# Patient Record
Sex: Female | Born: 1945 | Race: White | Hispanic: No | Marital: Married | Smoking: Former smoker
Health system: Southern US, Community
[De-identification: ages and names within clinical notes are randomized; demographics above are authoritative.]

## PROBLEM LIST (undated history)

## (undated) DIAGNOSIS — I82409 Acute embolism and thrombosis of unspecified deep veins of unspecified lower extremity: Secondary | ICD-10-CM

## (undated) DIAGNOSIS — R079 Chest pain, unspecified: Secondary | ICD-10-CM

## (undated) DIAGNOSIS — F419 Anxiety disorder, unspecified: Secondary | ICD-10-CM

## (undated) DIAGNOSIS — I4891 Unspecified atrial fibrillation: Secondary | ICD-10-CM

## (undated) DIAGNOSIS — I959 Hypotension, unspecified: Secondary | ICD-10-CM

## (undated) DIAGNOSIS — M1990 Unspecified osteoarthritis, unspecified site: Secondary | ICD-10-CM

## (undated) DIAGNOSIS — E119 Type 2 diabetes mellitus without complications: Secondary | ICD-10-CM

## (undated) DIAGNOSIS — C801 Malignant (primary) neoplasm, unspecified: Secondary | ICD-10-CM

## (undated) DIAGNOSIS — F32A Depression, unspecified: Secondary | ICD-10-CM

## (undated) DIAGNOSIS — J45909 Unspecified asthma, uncomplicated: Secondary | ICD-10-CM

## (undated) DIAGNOSIS — R269 Unspecified abnormalities of gait and mobility: Secondary | ICD-10-CM

## (undated) DIAGNOSIS — G8929 Other chronic pain: Secondary | ICD-10-CM

## (undated) DIAGNOSIS — F329 Major depressive disorder, single episode, unspecified: Secondary | ICD-10-CM

## (undated) DIAGNOSIS — G4733 Obstructive sleep apnea (adult) (pediatric): Secondary | ICD-10-CM

## (undated) DIAGNOSIS — R0602 Shortness of breath: Secondary | ICD-10-CM

## (undated) DIAGNOSIS — E039 Hypothyroidism, unspecified: Secondary | ICD-10-CM

## (undated) DIAGNOSIS — E079 Disorder of thyroid, unspecified: Secondary | ICD-10-CM

## (undated) DIAGNOSIS — Z9989 Dependence on other enabling machines and devices: Secondary | ICD-10-CM

## (undated) DIAGNOSIS — K219 Gastro-esophageal reflux disease without esophagitis: Secondary | ICD-10-CM

## (undated) DIAGNOSIS — G43909 Migraine, unspecified, not intractable, without status migrainosus: Secondary | ICD-10-CM

## (undated) DIAGNOSIS — I509 Heart failure, unspecified: Secondary | ICD-10-CM

## (undated) DIAGNOSIS — M2550 Pain in unspecified joint: Secondary | ICD-10-CM

## (undated) DIAGNOSIS — N8502 Endometrial intraepithelial neoplasia [EIN]: Secondary | ICD-10-CM

## (undated) DIAGNOSIS — R609 Edema, unspecified: Secondary | ICD-10-CM

## (undated) DIAGNOSIS — E785 Hyperlipidemia, unspecified: Secondary | ICD-10-CM

## (undated) DIAGNOSIS — Z9289 Personal history of other medical treatment: Secondary | ICD-10-CM

## (undated) DIAGNOSIS — G4730 Sleep apnea, unspecified: Secondary | ICD-10-CM

## (undated) DIAGNOSIS — I10 Essential (primary) hypertension: Secondary | ICD-10-CM

## (undated) DIAGNOSIS — I499 Cardiac arrhythmia, unspecified: Secondary | ICD-10-CM

## (undated) DIAGNOSIS — M549 Dorsalgia, unspecified: Secondary | ICD-10-CM

## (undated) DIAGNOSIS — J189 Pneumonia, unspecified organism: Secondary | ICD-10-CM

## (undated) DIAGNOSIS — G35 Multiple sclerosis: Secondary | ICD-10-CM

## (undated) DIAGNOSIS — K5900 Constipation, unspecified: Secondary | ICD-10-CM

## (undated) DIAGNOSIS — I480 Paroxysmal atrial fibrillation: Secondary | ICD-10-CM

## (undated) HISTORY — DX: Depression, unspecified: F32.A

## (undated) HISTORY — DX: Unspecified abnormalities of gait and mobility: R26.9

## (undated) HISTORY — PX: WISDOM TOOTH EXTRACTION: SHX21

## (undated) HISTORY — DX: Edema, unspecified: R60.9

## (undated) HISTORY — DX: Other chronic pain: G89.29

## (undated) HISTORY — DX: Unspecified osteoarthritis, unspecified site: M19.90

## (undated) HISTORY — DX: Chest pain, unspecified: R07.9

## (undated) HISTORY — PX: OTHER SURGICAL HISTORY: SHX169

## (undated) HISTORY — DX: Endometrial intraepithelial neoplasia (EIN): N85.02

## (undated) HISTORY — PX: BACK SURGERY: SHX140

## (undated) HISTORY — DX: Paroxysmal atrial fibrillation: I48.0

## (undated) HISTORY — DX: Anxiety disorder, unspecified: F41.9

## (undated) HISTORY — PX: VARICOSE VEIN SURGERY: SHX832

## (undated) HISTORY — DX: Personal history of other medical treatment: Z92.89

## (undated) HISTORY — DX: Acute embolism and thrombosis of unspecified deep veins of unspecified lower extremity: I82.409

## (undated) HISTORY — DX: Constipation, unspecified: K59.00

## (undated) HISTORY — DX: Pain in unspecified joint: M25.50

## (undated) HISTORY — PX: CATARACT EXTRACTION, BILATERAL: SHX1313

## (undated) HISTORY — PX: INFUSION PUMP IMPLANTATION: SHX1824

## (undated) HISTORY — DX: Dorsalgia, unspecified: M54.9

## (undated) HISTORY — PX: DILATION AND CURETTAGE OF UTERUS: SHX78

## (undated) HISTORY — PX: TONSILLECTOMY AND ADENOIDECTOMY: SUR1326

## (undated) HISTORY — DX: Major depressive disorder, single episode, unspecified: F32.9

---

## 1981-08-21 DIAGNOSIS — I82409 Acute embolism and thrombosis of unspecified deep veins of unspecified lower extremity: Secondary | ICD-10-CM

## 1981-08-21 HISTORY — DX: Acute embolism and thrombosis of unspecified deep veins of unspecified lower extremity: I82.409

## 1998-02-10 ENCOUNTER — Ambulatory Visit (HOSPITAL_COMMUNITY): Admission: RE | Admit: 1998-02-10 | Discharge: 1998-02-10 | Payer: Self-pay | Admitting: Gynecology

## 1998-04-12 ENCOUNTER — Ambulatory Visit (HOSPITAL_COMMUNITY): Admission: RE | Admit: 1998-04-12 | Discharge: 1998-04-12 | Payer: Self-pay | Admitting: Gynecology

## 1998-04-28 ENCOUNTER — Ambulatory Visit: Admission: RE | Admit: 1998-04-28 | Discharge: 1998-04-28 | Payer: Self-pay | Admitting: Gynecology

## 1998-05-12 ENCOUNTER — Other Ambulatory Visit: Admission: RE | Admit: 1998-05-12 | Discharge: 1998-05-12 | Payer: Self-pay | Admitting: Gynecology

## 1998-05-31 ENCOUNTER — Ambulatory Visit (HOSPITAL_COMMUNITY): Admission: RE | Admit: 1998-05-31 | Discharge: 1998-05-31 | Payer: Self-pay | Admitting: Gynecology

## 1998-05-31 ENCOUNTER — Encounter: Payer: Self-pay | Admitting: Gynecology

## 1998-11-18 ENCOUNTER — Ambulatory Visit (HOSPITAL_COMMUNITY): Admission: RE | Admit: 1998-11-18 | Discharge: 1998-11-18 | Payer: Self-pay | Admitting: Gynecology

## 1998-11-18 ENCOUNTER — Encounter: Payer: Self-pay | Admitting: Gynecology

## 1998-12-15 ENCOUNTER — Encounter: Payer: Self-pay | Admitting: Gynecology

## 1998-12-15 ENCOUNTER — Ambulatory Visit (HOSPITAL_COMMUNITY): Admission: RE | Admit: 1998-12-15 | Discharge: 1998-12-15 | Payer: Self-pay | Admitting: Gynecology

## 1998-12-21 ENCOUNTER — Other Ambulatory Visit: Admission: RE | Admit: 1998-12-21 | Discharge: 1998-12-21 | Payer: Self-pay | Admitting: Gynecology

## 1999-05-09 ENCOUNTER — Ambulatory Visit (HOSPITAL_COMMUNITY): Admission: RE | Admit: 1999-05-09 | Discharge: 1999-05-09 | Payer: Self-pay | Admitting: Gynecology

## 1999-05-09 ENCOUNTER — Encounter: Payer: Self-pay | Admitting: Gynecology

## 1999-09-19 ENCOUNTER — Emergency Department (HOSPITAL_COMMUNITY): Admission: EM | Admit: 1999-09-19 | Discharge: 1999-09-20 | Payer: Self-pay | Admitting: Internal Medicine

## 2000-01-18 ENCOUNTER — Encounter: Payer: Self-pay | Admitting: Gynecology

## 2000-01-18 ENCOUNTER — Encounter: Admission: RE | Admit: 2000-01-18 | Discharge: 2000-01-18 | Payer: Self-pay | Admitting: Gynecology

## 2000-03-01 ENCOUNTER — Ambulatory Visit (HOSPITAL_COMMUNITY): Admission: RE | Admit: 2000-03-01 | Discharge: 2000-03-01 | Payer: Self-pay | Admitting: Gynecology

## 2000-03-01 ENCOUNTER — Encounter: Payer: Self-pay | Admitting: Gynecology

## 2000-08-20 ENCOUNTER — Encounter: Payer: Self-pay | Admitting: Gynecology

## 2000-08-20 ENCOUNTER — Ambulatory Visit (HOSPITAL_COMMUNITY): Admission: RE | Admit: 2000-08-20 | Discharge: 2000-08-20 | Payer: Self-pay | Admitting: Gynecology

## 2001-01-07 ENCOUNTER — Encounter: Payer: Self-pay | Admitting: Gynecology

## 2001-01-07 ENCOUNTER — Ambulatory Visit (HOSPITAL_COMMUNITY): Admission: RE | Admit: 2001-01-07 | Discharge: 2001-01-07 | Payer: Self-pay | Admitting: Gynecology

## 2001-01-20 ENCOUNTER — Encounter: Payer: Self-pay | Admitting: Emergency Medicine

## 2001-01-20 ENCOUNTER — Emergency Department (HOSPITAL_COMMUNITY): Admission: EM | Admit: 2001-01-20 | Discharge: 2001-01-20 | Payer: Self-pay | Admitting: Emergency Medicine

## 2001-01-21 ENCOUNTER — Encounter: Payer: Self-pay | Admitting: Gynecology

## 2001-01-21 ENCOUNTER — Encounter: Admission: RE | Admit: 2001-01-21 | Discharge: 2001-01-21 | Payer: Self-pay | Admitting: Gynecology

## 2001-07-16 ENCOUNTER — Ambulatory Visit (HOSPITAL_COMMUNITY): Admission: RE | Admit: 2001-07-16 | Discharge: 2001-07-16 | Payer: Self-pay | Admitting: Gynecology

## 2001-07-16 ENCOUNTER — Encounter: Payer: Self-pay | Admitting: Gynecology

## 2002-01-28 ENCOUNTER — Encounter: Payer: Self-pay | Admitting: Gynecology

## 2002-01-28 ENCOUNTER — Encounter: Admission: RE | Admit: 2002-01-28 | Discharge: 2002-01-28 | Payer: Self-pay | Admitting: Gynecology

## 2002-01-30 ENCOUNTER — Encounter: Payer: Self-pay | Admitting: Gynecology

## 2002-01-30 ENCOUNTER — Ambulatory Visit (HOSPITAL_COMMUNITY): Admission: RE | Admit: 2002-01-30 | Discharge: 2002-01-30 | Payer: Self-pay | Admitting: Gynecology

## 2002-02-04 ENCOUNTER — Other Ambulatory Visit: Admission: RE | Admit: 2002-02-04 | Discharge: 2002-02-04 | Payer: Self-pay | Admitting: Gynecology

## 2002-09-18 ENCOUNTER — Encounter: Admission: RE | Admit: 2002-09-18 | Discharge: 2002-09-18 | Payer: Self-pay | Admitting: Internal Medicine

## 2002-09-18 ENCOUNTER — Encounter: Payer: Self-pay | Admitting: Internal Medicine

## 2002-10-23 ENCOUNTER — Encounter: Payer: Self-pay | Admitting: Emergency Medicine

## 2002-10-23 ENCOUNTER — Inpatient Hospital Stay (HOSPITAL_COMMUNITY): Admission: EM | Admit: 2002-10-23 | Discharge: 2002-10-29 | Payer: Self-pay | Admitting: Emergency Medicine

## 2002-10-29 ENCOUNTER — Encounter: Payer: Self-pay | Admitting: Neurosurgery

## 2002-11-14 ENCOUNTER — Encounter: Payer: Self-pay | Admitting: Neurosurgery

## 2002-11-14 ENCOUNTER — Encounter: Admission: RE | Admit: 2002-11-14 | Discharge: 2002-11-14 | Payer: Self-pay | Admitting: Neurosurgery

## 2002-12-01 ENCOUNTER — Encounter: Payer: Self-pay | Admitting: Neurosurgery

## 2002-12-01 ENCOUNTER — Encounter: Admission: RE | Admit: 2002-12-01 | Discharge: 2002-12-01 | Payer: Self-pay | Admitting: Neurosurgery

## 2003-01-06 ENCOUNTER — Encounter: Payer: Self-pay | Admitting: Gynecology

## 2003-01-06 ENCOUNTER — Ambulatory Visit (HOSPITAL_COMMUNITY): Admission: RE | Admit: 2003-01-06 | Discharge: 2003-01-06 | Payer: Self-pay | Admitting: Gynecology

## 2003-01-30 ENCOUNTER — Encounter: Payer: Self-pay | Admitting: Gynecology

## 2003-01-30 ENCOUNTER — Encounter: Admission: RE | Admit: 2003-01-30 | Discharge: 2003-01-30 | Payer: Self-pay | Admitting: Gynecology

## 2003-02-26 ENCOUNTER — Encounter: Admission: RE | Admit: 2003-02-26 | Discharge: 2003-02-26 | Payer: Self-pay | Admitting: Neurosurgery

## 2003-02-26 ENCOUNTER — Encounter: Payer: Self-pay | Admitting: Neurosurgery

## 2003-03-04 ENCOUNTER — Other Ambulatory Visit: Admission: RE | Admit: 2003-03-04 | Discharge: 2003-03-04 | Payer: Self-pay | Admitting: Gynecology

## 2003-06-29 ENCOUNTER — Ambulatory Visit (HOSPITAL_COMMUNITY): Admission: RE | Admit: 2003-06-29 | Discharge: 2003-06-29 | Payer: Self-pay | Admitting: Gynecology

## 2003-08-13 ENCOUNTER — Encounter: Admission: RE | Admit: 2003-08-13 | Discharge: 2003-08-13 | Payer: Self-pay | Admitting: Neurosurgery

## 2003-09-24 ENCOUNTER — Ambulatory Visit (HOSPITAL_COMMUNITY): Admission: RE | Admit: 2003-09-24 | Discharge: 2003-09-24 | Payer: Self-pay | Admitting: Gynecology

## 2003-09-26 ENCOUNTER — Inpatient Hospital Stay (HOSPITAL_COMMUNITY): Admission: AD | Admit: 2003-09-26 | Discharge: 2003-09-29 | Payer: Self-pay | Admitting: Emergency Medicine

## 2003-10-14 ENCOUNTER — Ambulatory Visit (HOSPITAL_COMMUNITY): Admission: RE | Admit: 2003-10-14 | Discharge: 2003-10-14 | Payer: Self-pay | Admitting: Neurology

## 2003-10-27 ENCOUNTER — Encounter: Admission: RE | Admit: 2003-10-27 | Discharge: 2003-10-27 | Payer: Self-pay | Admitting: Neurosurgery

## 2003-11-03 ENCOUNTER — Inpatient Hospital Stay (HOSPITAL_COMMUNITY): Admission: EM | Admit: 2003-11-03 | Discharge: 2003-11-06 | Payer: Self-pay | Admitting: Emergency Medicine

## 2003-12-14 ENCOUNTER — Ambulatory Visit (HOSPITAL_COMMUNITY): Admission: RE | Admit: 2003-12-14 | Discharge: 2003-12-14 | Payer: Self-pay | Admitting: Neurology

## 2004-01-07 ENCOUNTER — Encounter: Admission: RE | Admit: 2004-01-07 | Discharge: 2004-01-07 | Payer: Self-pay | Admitting: Neurosurgery

## 2004-01-13 ENCOUNTER — Ambulatory Visit (HOSPITAL_COMMUNITY): Admission: RE | Admit: 2004-01-13 | Discharge: 2004-01-13 | Payer: Self-pay | Admitting: Gynecology

## 2004-01-20 HISTORY — PX: ANTERIOR CERVICAL DECOMP/DISCECTOMY FUSION: SHX1161

## 2004-01-26 ENCOUNTER — Inpatient Hospital Stay (HOSPITAL_COMMUNITY): Admission: RE | Admit: 2004-01-26 | Discharge: 2004-01-31 | Payer: Self-pay | Admitting: Neurosurgery

## 2004-02-03 ENCOUNTER — Inpatient Hospital Stay (HOSPITAL_COMMUNITY): Admission: EM | Admit: 2004-02-03 | Discharge: 2004-02-10 | Payer: Self-pay

## 2004-02-12 ENCOUNTER — Inpatient Hospital Stay (HOSPITAL_COMMUNITY): Admission: AD | Admit: 2004-02-12 | Discharge: 2004-02-15 | Payer: Self-pay | Admitting: Neurological Surgery

## 2004-04-11 ENCOUNTER — Encounter: Admission: RE | Admit: 2004-04-11 | Discharge: 2004-04-11 | Payer: Self-pay | Admitting: Gynecology

## 2004-04-21 ENCOUNTER — Encounter: Admission: RE | Admit: 2004-04-21 | Discharge: 2004-04-21 | Payer: Self-pay | Admitting: Neurosurgery

## 2004-05-03 ENCOUNTER — Other Ambulatory Visit: Admission: RE | Admit: 2004-05-03 | Discharge: 2004-05-03 | Payer: Self-pay | Admitting: Gynecology

## 2004-05-15 ENCOUNTER — Ambulatory Visit (HOSPITAL_COMMUNITY): Admission: RE | Admit: 2004-05-15 | Discharge: 2004-05-15 | Payer: Self-pay | Admitting: Neurosurgery

## 2004-06-15 ENCOUNTER — Encounter: Admission: RE | Admit: 2004-06-15 | Discharge: 2004-06-15 | Payer: Self-pay | Admitting: Internal Medicine

## 2004-08-02 ENCOUNTER — Encounter: Admission: RE | Admit: 2004-08-02 | Discharge: 2004-08-02 | Payer: Self-pay | Admitting: Neurosurgery

## 2004-09-23 ENCOUNTER — Encounter (HOSPITAL_COMMUNITY): Admission: RE | Admit: 2004-09-23 | Discharge: 2004-12-22 | Payer: Self-pay | Admitting: Neurology

## 2004-11-10 ENCOUNTER — Ambulatory Visit (HOSPITAL_COMMUNITY): Admission: RE | Admit: 2004-11-10 | Discharge: 2004-11-10 | Payer: Self-pay | Admitting: Gynecology

## 2004-11-24 ENCOUNTER — Encounter (INDEPENDENT_AMBULATORY_CARE_PROVIDER_SITE_OTHER): Payer: Self-pay | Admitting: *Deleted

## 2004-11-24 ENCOUNTER — Ambulatory Visit (HOSPITAL_COMMUNITY): Admission: RE | Admit: 2004-11-24 | Discharge: 2004-11-24 | Payer: Self-pay | Admitting: Gynecology

## 2004-11-28 ENCOUNTER — Encounter: Admission: RE | Admit: 2004-11-28 | Discharge: 2004-11-28 | Payer: Self-pay | Admitting: Neurosurgery

## 2004-12-29 ENCOUNTER — Encounter (HOSPITAL_COMMUNITY): Admission: RE | Admit: 2004-12-29 | Discharge: 2005-03-29 | Payer: Self-pay | Admitting: Neurology

## 2005-03-30 ENCOUNTER — Encounter (HOSPITAL_COMMUNITY): Admission: RE | Admit: 2005-03-30 | Discharge: 2005-06-28 | Payer: Self-pay | Admitting: Neurology

## 2005-04-12 ENCOUNTER — Encounter: Admission: RE | Admit: 2005-04-12 | Discharge: 2005-04-12 | Payer: Self-pay | Admitting: Gynecology

## 2005-05-12 ENCOUNTER — Ambulatory Visit (HOSPITAL_COMMUNITY): Admission: RE | Admit: 2005-05-12 | Discharge: 2005-05-12 | Payer: Self-pay | Admitting: Gynecology

## 2005-05-18 ENCOUNTER — Other Ambulatory Visit: Admission: RE | Admit: 2005-05-18 | Discharge: 2005-05-18 | Payer: Self-pay | Admitting: Gynecology

## 2005-07-05 ENCOUNTER — Encounter: Admission: RE | Admit: 2005-07-05 | Discharge: 2005-07-05 | Payer: Self-pay | Admitting: Internal Medicine

## 2005-07-07 ENCOUNTER — Encounter: Admission: RE | Admit: 2005-07-07 | Discharge: 2005-07-07 | Payer: Self-pay | Admitting: Neurosurgery

## 2005-07-10 ENCOUNTER — Encounter (HOSPITAL_COMMUNITY): Admission: RE | Admit: 2005-07-10 | Discharge: 2005-10-08 | Payer: Self-pay | Admitting: Neurology

## 2005-07-31 ENCOUNTER — Encounter: Admission: RE | Admit: 2005-07-31 | Discharge: 2005-07-31 | Payer: Self-pay | Admitting: Neurology

## 2005-10-16 ENCOUNTER — Inpatient Hospital Stay (HOSPITAL_COMMUNITY): Admission: AD | Admit: 2005-10-16 | Discharge: 2005-10-19 | Payer: Self-pay | Admitting: Neurology

## 2005-12-15 ENCOUNTER — Encounter: Admission: RE | Admit: 2005-12-15 | Discharge: 2005-12-15 | Payer: Self-pay | Admitting: Internal Medicine

## 2005-12-20 ENCOUNTER — Encounter: Admission: RE | Admit: 2005-12-20 | Discharge: 2005-12-20 | Payer: Self-pay | Admitting: Neurosurgery

## 2006-01-03 ENCOUNTER — Ambulatory Visit (HOSPITAL_COMMUNITY): Admission: RE | Admit: 2006-01-03 | Discharge: 2006-01-03 | Payer: Self-pay | Admitting: Gynecology

## 2006-02-20 ENCOUNTER — Emergency Department (HOSPITAL_COMMUNITY): Admission: EM | Admit: 2006-02-20 | Discharge: 2006-02-21 | Payer: Self-pay | Admitting: Emergency Medicine

## 2006-05-23 ENCOUNTER — Encounter: Admission: RE | Admit: 2006-05-23 | Discharge: 2006-05-23 | Payer: Self-pay | Admitting: Gynecology

## 2006-06-17 ENCOUNTER — Encounter: Admission: RE | Admit: 2006-06-17 | Discharge: 2006-06-17 | Payer: Self-pay | Admitting: Neurology

## 2006-06-20 ENCOUNTER — Encounter (HOSPITAL_COMMUNITY): Admission: RE | Admit: 2006-06-20 | Discharge: 2006-09-18 | Payer: Self-pay | Admitting: Neurology

## 2006-08-02 ENCOUNTER — Encounter: Admission: RE | Admit: 2006-08-02 | Discharge: 2006-08-02 | Payer: Self-pay | Admitting: Neurosurgery

## 2006-09-10 ENCOUNTER — Encounter: Admission: RE | Admit: 2006-09-10 | Discharge: 2006-09-10 | Payer: Self-pay | Admitting: Neurosurgery

## 2006-10-23 ENCOUNTER — Encounter (HOSPITAL_COMMUNITY): Admission: RE | Admit: 2006-10-23 | Discharge: 2007-01-21 | Payer: Self-pay | Admitting: Neurology

## 2007-01-02 ENCOUNTER — Encounter: Admission: RE | Admit: 2007-01-02 | Discharge: 2007-01-02 | Payer: Self-pay | Admitting: Neurosurgery

## 2007-01-25 ENCOUNTER — Encounter (HOSPITAL_COMMUNITY): Admission: RE | Admit: 2007-01-25 | Discharge: 2007-04-25 | Payer: Self-pay | Admitting: Neurology

## 2007-05-08 ENCOUNTER — Encounter: Admission: RE | Admit: 2007-05-08 | Discharge: 2007-05-08 | Payer: Self-pay | Admitting: Neurosurgery

## 2007-05-19 ENCOUNTER — Emergency Department (HOSPITAL_COMMUNITY): Admission: EM | Admit: 2007-05-19 | Discharge: 2007-05-19 | Payer: Self-pay | Admitting: Emergency Medicine

## 2007-06-12 ENCOUNTER — Ambulatory Visit (HOSPITAL_COMMUNITY): Admission: RE | Admit: 2007-06-12 | Discharge: 2007-06-12 | Payer: Self-pay | Admitting: Neurology

## 2007-07-08 ENCOUNTER — Encounter (HOSPITAL_COMMUNITY): Admission: RE | Admit: 2007-07-08 | Discharge: 2007-08-21 | Payer: Self-pay | Admitting: Neurology

## 2007-07-31 ENCOUNTER — Encounter: Admission: RE | Admit: 2007-07-31 | Discharge: 2007-07-31 | Payer: Self-pay | Admitting: Neurosurgery

## 2007-08-02 ENCOUNTER — Encounter: Admission: RE | Admit: 2007-08-02 | Discharge: 2007-08-02 | Payer: Self-pay | Admitting: Neurology

## 2007-08-22 ENCOUNTER — Encounter (HOSPITAL_COMMUNITY): Admission: RE | Admit: 2007-08-22 | Discharge: 2007-11-20 | Payer: Self-pay | Admitting: Neurology

## 2007-09-23 ENCOUNTER — Inpatient Hospital Stay (HOSPITAL_COMMUNITY): Admission: AD | Admit: 2007-09-23 | Discharge: 2007-09-26 | Payer: Self-pay | Admitting: Neurology

## 2007-09-24 ENCOUNTER — Ambulatory Visit: Payer: Self-pay | Admitting: Physical Medicine & Rehabilitation

## 2007-10-17 ENCOUNTER — Inpatient Hospital Stay (HOSPITAL_COMMUNITY): Admission: RE | Admit: 2007-10-17 | Discharge: 2007-10-20 | Payer: Self-pay | Admitting: Neurosurgery

## 2007-12-16 ENCOUNTER — Other Ambulatory Visit: Admission: RE | Admit: 2007-12-16 | Discharge: 2007-12-16 | Payer: Self-pay | Admitting: Gynecology

## 2007-12-16 ENCOUNTER — Encounter: Admission: RE | Admit: 2007-12-16 | Discharge: 2007-12-16 | Payer: Self-pay | Admitting: Gynecology

## 2007-12-17 ENCOUNTER — Ambulatory Visit (HOSPITAL_COMMUNITY): Admission: RE | Admit: 2007-12-17 | Discharge: 2007-12-17 | Payer: Self-pay | Admitting: Gynecology

## 2008-02-19 ENCOUNTER — Encounter (HOSPITAL_COMMUNITY): Admission: RE | Admit: 2008-02-19 | Discharge: 2008-04-15 | Payer: Self-pay | Admitting: Neurology

## 2008-03-16 ENCOUNTER — Encounter: Admission: RE | Admit: 2008-03-16 | Discharge: 2008-03-16 | Payer: Self-pay | Admitting: Neurology

## 2008-06-04 ENCOUNTER — Encounter (HOSPITAL_COMMUNITY): Admission: RE | Admit: 2008-06-04 | Discharge: 2008-09-02 | Payer: Self-pay | Admitting: Neurology

## 2008-09-29 ENCOUNTER — Ambulatory Visit (HOSPITAL_COMMUNITY): Admission: RE | Admit: 2008-09-29 | Discharge: 2008-09-29 | Payer: Self-pay | Admitting: Internal Medicine

## 2008-11-25 ENCOUNTER — Ambulatory Visit (HOSPITAL_COMMUNITY): Admission: RE | Admit: 2008-11-25 | Discharge: 2008-11-25 | Payer: Self-pay | Admitting: Gynecology

## 2009-11-26 ENCOUNTER — Ambulatory Visit (HOSPITAL_COMMUNITY): Admission: RE | Admit: 2009-11-26 | Discharge: 2009-11-26 | Payer: Self-pay | Admitting: Gynecology

## 2009-12-29 ENCOUNTER — Emergency Department (HOSPITAL_COMMUNITY): Admission: EM | Admit: 2009-12-29 | Discharge: 2009-12-29 | Payer: Self-pay | Admitting: Emergency Medicine

## 2010-03-04 ENCOUNTER — Encounter: Admission: RE | Admit: 2010-03-04 | Discharge: 2010-03-04 | Payer: Self-pay | Admitting: Neurology

## 2010-04-21 ENCOUNTER — Ambulatory Visit: Payer: Self-pay | Admitting: Internal Medicine

## 2010-04-21 ENCOUNTER — Observation Stay (HOSPITAL_COMMUNITY): Admission: EM | Admit: 2010-04-21 | Discharge: 2010-04-22 | Payer: Self-pay | Admitting: Emergency Medicine

## 2010-04-22 ENCOUNTER — Encounter (INDEPENDENT_AMBULATORY_CARE_PROVIDER_SITE_OTHER): Payer: Self-pay | Admitting: Emergency Medicine

## 2010-04-22 ENCOUNTER — Ambulatory Visit: Payer: Self-pay | Admitting: Vascular Surgery

## 2010-04-22 DIAGNOSIS — Z9289 Personal history of other medical treatment: Secondary | ICD-10-CM

## 2010-04-22 HISTORY — DX: Personal history of other medical treatment: Z92.89

## 2010-06-18 ENCOUNTER — Inpatient Hospital Stay (HOSPITAL_COMMUNITY): Admission: EM | Admit: 2010-06-18 | Discharge: 2010-06-23 | Payer: Self-pay | Admitting: Emergency Medicine

## 2010-06-21 DIAGNOSIS — Z9289 Personal history of other medical treatment: Secondary | ICD-10-CM

## 2010-06-21 HISTORY — DX: Personal history of other medical treatment: Z92.89

## 2010-06-22 HISTORY — PX: CARDIAC CATHETERIZATION: SHX172

## 2010-09-10 ENCOUNTER — Encounter: Payer: Self-pay | Admitting: Neurosurgery

## 2010-09-10 ENCOUNTER — Encounter: Payer: Self-pay | Admitting: Gynecology

## 2010-09-10 ENCOUNTER — Encounter: Payer: Self-pay | Admitting: Neurology

## 2010-09-11 ENCOUNTER — Encounter: Payer: Self-pay | Admitting: Neurology

## 2010-09-11 ENCOUNTER — Encounter: Payer: Self-pay | Admitting: Gynecology

## 2010-09-11 ENCOUNTER — Encounter: Payer: Self-pay | Admitting: Neurosurgery

## 2010-09-12 ENCOUNTER — Encounter: Payer: Self-pay | Admitting: Gynecology

## 2010-11-01 LAB — COMPREHENSIVE METABOLIC PANEL
ALT: 25 U/L (ref 0–35)
AST: 23 U/L (ref 0–37)
CO2: 33 mEq/L — ABNORMAL HIGH (ref 19–32)
Chloride: 102 mEq/L (ref 96–112)
GFR calc Af Amer: >60 mL/min (ref >60)
GFR calc non Af Amer: 55 mL/min — ABNORMAL LOW (ref >60)
Potassium: 3.9 mEq/L (ref 3.5–5.1)
Sodium: 140 mEq/L (ref 135–145)
Total Bilirubin: 0.8 mg/dL (ref 0.3–1.2)

## 2010-11-01 LAB — BASIC METABOLIC PANEL
CO2: 35 mEq/L — ABNORMAL HIGH (ref 19–32)
Calcium: 8.8 mg/dL (ref 8.4–10.5)
Creatinine, Ser: 1.12 mg/dL (ref 0.4–1.2)
Glucose, Bld: 76 mg/dL (ref 70–99)
Sodium: 143 mEq/L (ref 135–145)

## 2010-11-02 LAB — BASIC METABOLIC PANEL
BUN: 14 mg/dL (ref 6–23)
Calcium: 9.6 mg/dL (ref 8.4–10.5)
Creatinine, Ser: 0.76 mg/dL (ref 0.4–1.2)
GFR calc non Af Amer: >60 mL/min (ref >60)

## 2010-11-02 LAB — CBC
HCT: 40.5 % (ref 36.0–46.0)
Hemoglobin: 15 g/dL (ref 12.0–15.0)
MCH: 31 pg (ref 26.0–34.0)
MCH: 31.4 pg (ref 26.0–34.0)
MCHC: 33.6 g/dL (ref 30.0–36.0)
MCHC: 34.1 g/dL (ref 30.0–36.0)
MCV: 92.3 fL (ref 78.0–100.0)
RDW: 12.3 % (ref 11.5–15.5)

## 2010-11-02 LAB — DIFFERENTIAL
Lymphocytes Relative: 26 % (ref 12–46)
Lymphs Abs: 1.5 10*3/uL (ref 0.7–4.0)
Neutro Abs: 3.3 10*3/uL (ref 1.7–7.7)
Neutrophils Relative %: 60 % (ref 43–77)

## 2010-11-02 LAB — CARDIAC PANEL(CRET KIN+CKTOT+MB+TROPI)
CK, MB: 1.1 ng/mL (ref 0.3–4.0)
Total CK: 46 U/L (ref 7–177)
Troponin I: <0.01 ng/mL (ref 0.00–0.06)
Troponin I: <0.01 ng/mL (ref 0.00–0.06)

## 2010-11-02 LAB — COMPREHENSIVE METABOLIC PANEL
AST: 22 U/L (ref 0–37)
BUN: 16 mg/dL (ref 6–23)
CO2: 32 mEq/L (ref 19–32)
Calcium: 9.4 mg/dL (ref 8.4–10.5)
Creatinine, Ser: 0.73 mg/dL (ref 0.4–1.2)
GFR calc Af Amer: >60 mL/min (ref >60)
GFR calc non Af Amer: >60 mL/min (ref >60)

## 2010-11-02 LAB — SEDIMENTATION RATE: Sed Rate: 9 mm/hr (ref 0–22)

## 2010-11-02 LAB — TSH: TSH: 1.636 u[IU]/mL (ref 0.350–4.500)

## 2010-11-02 LAB — POCT CARDIAC MARKERS
CKMB, poc: <1 ng/mL — ABNORMAL LOW (ref 1.0–8.0)
Myoglobin, poc: 49.1 ng/mL (ref 12–200)
Troponin i, poc: 0.09 ng/mL (ref 0.00–0.09)

## 2010-11-02 LAB — D-DIMER, QUANTITATIVE: D-Dimer, Quant: 0.27 ug/mL-FEU (ref 0.00–0.48)

## 2010-11-02 LAB — CK TOTAL AND CKMB (NOT AT ARMC)
CK, MB: 1.2 ng/mL (ref 0.3–4.0)
Total CK: 39 U/L (ref 7–177)

## 2010-11-03 LAB — POCT CARDIAC MARKERS
CKMB, poc: <1 ng/mL — ABNORMAL LOW (ref 1.0–8.0)
Myoglobin, poc: 76.3 ng/mL (ref 12–200)
Troponin i, poc: <0.05 ng/mL (ref 0.00–0.09)

## 2010-11-03 LAB — URINALYSIS, ROUTINE W REFLEX MICROSCOPIC
Bilirubin Urine: NEGATIVE
Glucose, UA: NEGATIVE mg/dL
Hgb urine dipstick: NEGATIVE
Ketones, ur: NEGATIVE mg/dL
Protein, ur: NEGATIVE mg/dL

## 2010-11-03 LAB — COMPREHENSIVE METABOLIC PANEL
AST: 41 U/L — ABNORMAL HIGH (ref 0–37)
Albumin: 4 g/dL (ref 3.5–5.2)
Alkaline Phosphatase: 61 U/L (ref 39–117)
BUN: 18 mg/dL (ref 6–23)
Chloride: 102 mEq/L (ref 96–112)
GFR calc Af Amer: >60 mL/min (ref >60)
Potassium: 4.3 mEq/L (ref 3.5–5.1)
Total Bilirubin: 0.9 mg/dL (ref 0.3–1.2)

## 2010-11-03 LAB — CBC
MCV: 90.4 fL (ref 78.0–100.0)
Platelets: 210 10*3/uL (ref 150–400)
RBC: 4.91 MIL/uL (ref 3.87–5.11)
WBC: 6.8 10*3/uL (ref 4.0–10.5)

## 2010-11-03 LAB — CK TOTAL AND CKMB (NOT AT ARMC)
Relative Index: INVALID (ref 0.0–2.5)
Relative Index: INVALID (ref 0.0–2.5)
Total CK: 38 U/L (ref 7–177)

## 2010-11-03 LAB — LIPID PANEL
HDL: 43 mg/dL (ref >39)
Total CHOL/HDL Ratio: 3 RATIO

## 2010-11-03 LAB — URINE MICROSCOPIC-ADD ON

## 2010-11-08 LAB — DIFFERENTIAL
Basophils Relative: 1 % (ref 0–1)
Eosinophils Absolute: 0.1 10*3/uL (ref 0.0–0.7)
Eosinophils Relative: 1 % (ref 0–5)
Lymphs Abs: 0.2 10*3/uL — ABNORMAL LOW (ref 0.7–4.0)
Monocytes Absolute: 1 10*3/uL (ref 0.1–1.0)
Monocytes Relative: 13 % — ABNORMAL HIGH (ref 3–12)
Neutrophils Relative %: 82 % — ABNORMAL HIGH (ref 43–77)

## 2010-11-08 LAB — CULTURE, BLOOD (ROUTINE X 2): Culture: NO GROWTH

## 2010-11-08 LAB — COMPREHENSIVE METABOLIC PANEL
ALT: 23 U/L (ref 0–35)
AST: 19 U/L (ref 0–37)
Albumin: 3.5 g/dL (ref 3.5–5.2)
Alkaline Phosphatase: 53 U/L (ref 39–117)
GFR calc Af Amer: >60 mL/min (ref >60)
Glucose, Bld: 75 mg/dL (ref 70–99)
Potassium: 4 mEq/L (ref 3.5–5.1)
Sodium: 138 mEq/L (ref 135–145)
Total Protein: 6.1 g/dL (ref 6.0–8.3)

## 2010-11-08 LAB — URINE CULTURE
Colony Count: NO GROWTH
Culture: NO GROWTH

## 2010-11-08 LAB — URINALYSIS, ROUTINE W REFLEX MICROSCOPIC
Glucose, UA: NEGATIVE mg/dL
Hgb urine dipstick: NEGATIVE
Ketones, ur: NEGATIVE mg/dL
Protein, ur: NEGATIVE mg/dL

## 2010-11-08 LAB — CBC
Hemoglobin: 14 g/dL (ref 12.0–15.0)
RBC: 4.45 MIL/uL (ref 3.87–5.11)
RDW: 12.3 % (ref 11.5–15.5)

## 2010-12-16 HISTORY — PX: VENOUS ABLATION: SHX2656

## 2011-01-03 NOTE — Op Note (Signed)
Toni [name], Toni [name]              ACCOUNT NO.:  [id number]   MEDICAL RECORD NO.:  [id number]          PATIENT TYPE:  INP   LOCATION:  3010                         FACILITY:  [facility]   PHYSICIAN:  Danae Orleans. Venetia Maxon, M.D.  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   PROCEDURE:  Baclofen pump placement, with intrathecal catheter and  subcutaneous reservoir.   SURGEON:  Danae Orleans. Venetia Maxon, M.D.   ASSISTANT:  Georgiann Cocker, R.N.   ANESTHESIA:  General endotracheal anesthesia.   ESTIMATED BLOOD LOSS:  Minimal.   COMPLICATIONS:  None.   DISPOSITION:  To recovery.   INDICATIONS:  Toni [name] is a 65 year old woman with spasticity  secondary to multiple sclerosis.  She did well with a baclofen trial  intrathecally, and it was elected to place a baclofen pump.   PROCEDURE:  Ms. Ansley was brought to the operating room.  She was placed  in the left lateral decubitus position after Foley catheter had been  inserted and general endotracheal anesthesia had been induced.  An  axillary roll was utilized for positioning.  Her back, flank, and  abdomen were then prepped and draped in the usual sterile fashion using  Betadine scrub and paint.  Using C-arm fluoroscopy, the spine was  identified, and a Tuohy needle was inserted into the spinal canal from a  paramedian oblique trajectory at approximately the level of L2-3.  The  intrathecal catheter was then inserted and fed up the level of  approximately T4.  After removing the needle, it was brought back to  about the level of T6.  There was spontaneous flow of CSF through the  catheter.  Cutdown was then performed along the needle, and lumbar  fascia was identified.  An anchoring stitch was placed, along with the  Medtronic anchor, and there was spontaneous flow through the catheter.  Subsequently, a subcutaneous pocket was created in the right abdomen,  and a tunnel was made with a two-piece catheter assembly from the  abdomen to the back.  The pump was filled with 20 mL of 500 mcg/mL  baclofen, and set to run at 50 mcg for 24 hours.  The pump was secured  with the sutureless anchor.  Positioning was confirmed with aspiration  through the sideport.  Three 2-0 silk ties were placed through the  fascia, and the suture loops on the pump, and anchored.  The redundant  catheter was circularized behind the pump, and it was placed in the  subcutaneous pocket.  The Silastic strain relief end was hooked to the  two-piece assembly and anchored appropriately and with a 2-0 silk  stitch.  The wounds were then  irrigated and closed with 2-0 and 3-0 Vicryl subcuticular stitches, and  wounds were dressed with Benzoin, Steri-Strips, Telfa gauze, and tape.  The patient was extubated in the operating room and taken to recovery in  stable satisfactory condition, having tolerated  the operation well.  Counts correct at the end of the case.      Danae Orleans. Venetia Maxon, M.D.  Electronically Signed     JDS/MEDQ  D:  10/17/2007  T:  10/18/2007  Job:  [id number]

## 2011-01-03 NOTE — Discharge Summary (Signed)
NAMESYDNY, SCHNITZLER              ACCOUNT NO.:  [id number]   MEDICAL RECORD NO.:  [id number]          PATIENT TYPE:  INP   LOCATION:  3010                         FACILITY:  [facility]   PHYSICIAN:  Stefani Dama, M.D.  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   DISCHARGE/FINAL DIAGNOSES:  1. 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  2. Status post insertion of baclofen pump.  3. Urinary incontinence.   CONDITION ON DISCHARGE:  Improving.   HOSPITAL COURSE:  Toni [name] is a 65 year old individual who has had  long term problems with spasticity secondary to multiple sclerosis.  She  was recently advised regarding placement of a Baclofen pump and this  procedure was undertaken on October 17, 2007.  Postoperatively, she had  some difficulties with urinary incontinence which had occurred  previously when she had a Baclofen trial.  This was treated  conservatively and at the current time she seems to be recovering quite  well from the surgery.  Her incisions are clean and dry.  She is  ambulatory with minimal assistance.  She will be discharged home today.  She has pain medications at home and no prescriptions are written today.   She will follow up in 7-10 days with Dr. Maeola Harman.   CONDITION ON DISCHARGE:  Improving.      Stefani Dama, M.D.  Electronically Signed     HJE/MEDQ  D:  10/20/2007  T:  10/20/2007  Job:  [id number]

## 2011-01-03 NOTE — H&P (Signed)
Toni [name], RENDA NO.:  [id number]   MEDICAL RECORD NO.:  [id number]          PATIENT TYPE:  INP   LOCATION:  3035                         FACILITY:  [facility]   PHYSICIAN:  Melvyn Novas, M.D.  DATE OF BIRTH:  January 21, 1946   DATE OF ADMISSION:  09/23/2007  DATE OF DISCHARGE:                              HISTORY & PHYSICAL   HISTORY:  She is a 65 year old Caucasian right-handed female.  Toni [name]  is admitted on September 23, 2007, in an elective admission for segmental  myoclonus and spasms in lower back, lower extremity and abdomen that  have become unbearable.   PAST MEDICAL HISTORY:  The patient's past medical history is most  remarkable for multiple sclerosis diagnosed in 1989 with positive  oligoclonal bands by lumbar puncture.  She also has classic MRI  findings.  The patient's symptoms precede a diagnosis by probably 5-7 years.  Toni [name] has been for many years on injectable interferon but then still  had up to 2 relapses in 6 month periods.  We did for a while treat this  patient with monthly Solu-Medrol infusions.  Except for alleviating some  back pain due to radiculopathic degenerative disk changes she did not  benefit in terms of her MS treatment.  She was then changed to Tysabri monoclonal antibodies.  The patient is  now admitted for these abdominal segmental myoclonic spasms.  The  patient is further having a history of neurogenic bladder, gait  disorder, morbid obesity and obstructive sleep apnea and she had a  cervical diskectomy by Dr. Channing Mutters about 4 years ago.  The patient has a  PICC line in place through which she receives Tysabri infusions.   CURRENT MEDICATION:  1. Prevacid 30 mg once a day.  2. Zocor 40 mg once a day.  3. Bystolic 2.5 mg once a day.  4. Wellbutrin extended release form 150 mg in the morning.  5. Lasix 10 mg p.o. in the a.m., can be taken p.r.n. extra for edema      as needed.  6. Flomax 0.4 mg in the morning.  7.  Synthroid 75 mcg daily.  8. Valium 5 mg b.i.d. but also taken as a half pill p.r.n. for spasms.  9. Aspirin 81 mg.  The patient did not take the aspirin for 3 days      prior to this admission.  10.Bethanechol 25 mg 2 tablets b.i.d. p.o. p.r.n. insomnia.  11.The patient has an Ambien order.  12.She also has p.r.n. ordered for laxative.  13.Also for Tylenol in case of fever or severe headaches.  14.The patient takes Neurontin 300 mg t.i.d.  15.Xanax 0.25 mg p.r.n.  16.She took at home Percocet for her lower back pain but had little      relief over the last couple of days.  17.Baclofen 25 mg by mouth up to three times a day and again without      the added Valium did not lead to relief of spasm.   The patient was seen this morning on September 24, 2007, by physical  therapy for a preintrathecal evaluation of  Lioresal 50 mcg as  fluoroscopic lumbar puncture.  This Lioresal intrathecal injection took  place.  The patient returned to the floor and was evaluated at 1 p.m. by  Lonia Skinner, PT.  She has a re-evaluation approximately every 2  hours scheduled up to 6:15 p.m. tonight and has tolerated the procedure  well.  At this time the patient has resumed a very erect posture, a  posture I have not seen with her for about 7 months.  She has been able  to walk with a walker and without a walker but on the walker she could  stand on either leg which I found remarkable.  She also was able to lift  all extremities antigravity without provoking any spasms in hamstrings,  buttocks or abdomen while deep tendon reflexes are preserved.   ASSESSMENT:  The patient's post Lioresal intrathecal infusion assessment  is not yet completed.  We will review tomorrow morning if this patient  is a good candidate for a baclofen pump.  She is an established patient  with Dr. Channing Mutters at [facility] who will refer her to Dr. Venetia Maxon  should they choose to place a baclofen pump.  All other medications  will  be given as ordered.  Discharge is presumed for tomorrow around  lunchtime.  I have not made a decision yet if the patient will receive  her next Tysabri infusion in 16 days' time as scheduled.  At this time  we will see if and how the patient responds to the Lioresal.     PS: The patient had an excellent response documented by physical  therapy, with resolution of spasmodic pain anf improvement in  ambulation.    I have left a message for Dr.Roy with the OR nurse to assure that the  patient will be seen  by him or Dr. Venetia Maxon so an arrangement for  Baclofen Pump insertion can be made.  The patient will be discharged , orders were written and I discussed the  details with her RN.      Melvyn Novas, M.D.  Electronically Signed     CD/MEDQ  D:  09/24/2007  T:  09/25/2007  Job:  [id number]   cc:   Payton Doughty, M.D.

## 2011-01-03 NOTE — Discharge Summary (Signed)
NAMEPATRISIA, [name] NO.:  [id number]   MEDICAL RECORD NO.:  [id number]          PATIENT TYPE:  INP   LOCATION:  3035                         FACILITY:  [facility]   PHYSICIAN:  Melvyn Novas, M.D.  DATE OF BIRTH:  1946/08/01   DATE OF ADMISSION:  09/23/2007  DATE OF DISCHARGE:  09/26/2007                               DISCHARGE SUMMARY   HISTORY OF PRESENT ILLNESS:  The patient was admitted for an  observational stay on September 23, 2007, to the [facility] Third  Floor, Neuroscience, and presented on September 23, 2007, at 5 p.m.  She  was admitted by Dr. Anne Hahn, the neurologist on call.  The patient's admission was meant to evaluate her for possible baclofen  pump and a baclofen challenge with intrathecal baclofen dose to be given  and a fluoroscopic LP was arranged.   The patient was also seen by physical therapy before and after.  The  patient responded very well to the baclofen challenge and was supposed  to be discharged on September 24, 2007, after this challenge.  However,  discharge orders were not followed, and the patient was left until  September 25, 2007, on the floor when she was finally discharged when she  was supposed to be discharged from the neurology service and to have a  followup appointment with Dr. Channing Mutters and Dr. Venetia Maxon at [facility].   Physical therapy and occupational therapy saw no need for further  therapies at home.  The patient was still in the hospital on September 26, 2007, when she was finally physically discharged.   The neurology service had assumed that the patient had already been at  home.   FOLLOWUP:  Followup appointments with neurosurgeons was made by phone  and a date for the implantation of a baclofen pump was set for October 31, 2007.   CURRENT MEDICATIONS:  1. Prevacid 30 mg once a day.  2. Zocor 40 mg once a day.  3. Bystolic 2.5 mg once a day.  4. Wellbutrin extended release 150 mg in the  morning.  5. Lasix.  6. Flomax.  7. Synthroid 75 mcg.  8. Valium 5 mg b.i.d.  9. Aspirin 81 mg a day.  10.Bethanechol 25 mg 2 tablets b.i.d.  11.The patient had Ambien order p.r.n.  12.She has p.r.n. order for laxatives.  13.Tylenol in case of fever or headache.  14.She takes Neurontin 300 mg b.i.d.  15.Xanax p.r.n. 0.25 mg.  16.Baclofen was taken now at 25 mg by mouth 3-4 times a day.  17.Added Valium to relieve myoclonic spasms and segmental myoclonus      which we hope to treat with the intrathecal pump.      Melvyn Novas, M.D.  Electronically Signed     CD/MEDQ  D:  11/06/2007  T:  11/07/2007  Job:  [id number]

## 2011-01-06 NOTE — Discharge Summary (Signed)
NAMEJAMMI, MORRISSETTE NO.:  [id number]   MEDICAL RECORD NO.:  [id number]          PATIENT TYPE:  INP   LOCATION:  3039                         FACILITY:  [facility]   PHYSICIAN:  Melvyn Novas, M.D.  DATE OF BIRTH:  August 01, 1946   DATE OF ADMISSION:  10/16/2005  DATE OF DISCHARGE:                                 DISCHARGE SUMMARY   The patient was born on 12/24/1945.  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    The patient is established with our practice as an MS patient and has also  been followed by Dr. Trey Sailors of [facility] for a  cervical degenerative disk disease and is status post fusion.   The patient received Solu-Medrol 500 mg daily for three days during her  admission and responded well with pain relief.  Percocet was given p.r.n.  The patient also received upon Dr. Temple Pacini request a soft collar to support  her neck spine.  MRI was obtained showing no active MS plugs on her cervical spine, but a  medial disk prolapse/herniation at C7 which is the first vertebral level  below her previous fusion.  Radiographic images are suggestive of bilateral  C7 root impingement.   The patient did tolerate the Solu-Medrol very well.  Also, she developed  some insomnia which was treated with Ambien.  She will now be discharged.  Will still wear the soft collar.  The following medications are ordered.   MEDICATIONS:  1.  Ambien 10 mg p.r.n. insomnia.  2.  A steroid dose pack of prednisone 20 mg for five days q.a.m., then 10 mg      for five days q.a.m., then to be discontinued.  3.  Synthroid 75 mcg daily is a continued medication that the patient was on      before.  4.  Percocet was ordered p.r.n. pain and the patient will have an      unrefillable 20 pill  supply.  5.  Baclofen 20 mg four times daily is a medication that the patient was on      before and will continue.  6.  Continued will further be bethanechol 25 mg two at breakfast, two at      lunch.  7.  Wellbutrin XL 300 mg a day.  8.  Neurontin 300 mg twice a day p.o.  9.  Prevacid 30 mg once a day p.o.  10. Atenolol 50 mg once a day p.o.  11. Hydrochlorothiazide 25 mg p.o.   The patient will follow up with Dr. Felipa Eth within the next four to six weeks  for her primary care needs.  I have discussed with the pharmacist that  during her hospitalization in case the patient should receive  her short-  term steroid treatment , that any insulin coverage is not needed-  as her  fasting glucose levels have been elevated but pharmacy felt that that would  be a  financial burden not warranted for short-term treatment.  The patient will continue her regular IV Solu-Medrol infusions in six weeks.  She will follow up with me in eight weeks for re-visit.  She requested a copy of today's discharge note to go to  Dr. Felipa Eth,  Dr. Trey Sailors,  and would like [facility], her PT establishment, to be informed  of her discharge and would like the cervical radiculopathy addressed by her  current physical therapist who works with her MS disabilities.      Melvyn Novas, M.D.  Electronically Signed     CD/MEDQ  D:  10/19/2005  T:  10/19/2005  Job:  [id number]   cc:   Larina Earthly, M.D.  Fax: [phone]   Payton Doughty, M.D.  Fax: [phone]

## 2011-01-06 NOTE — H&P (Signed)
NAME:  KIANNI, LHEUREUX NO.:  [id number]   MEDICAL RECORD NO.:  [id number]                   PATIENT TYPE:  INP   LOCATION:  3016                                 FACILITY:  [facility]   PHYSICIAN:  Marlan Palau, M.D.               DATE OF BIRTH:  1945-11-24   DATE OF ADMISSION:  11/03/2003  DATE OF DISCHARGE:                                HISTORY & PHYSICAL   HISTORY OF PRESENT ILLNESS:  Britainy Kozub is a 65 year old right-handed  white female born 06-30-1946, with a history of multiple sclerosis  dating back to 1989.  The patient has had very frequent MS attacks recently,  was in hospital about a year ago as well as in February 2005 with MS  exacerbations.  She has just come off of a taper of prednisone within the  last 10 days to 12 days and comes in today with another MS exacerbation. The  patient lay down, took a nap and noted onset after this of some burning  sensations in both legs up to the knees, weakness in the legs, right greater  than left, trouble  burning with the right arm.  The patient has noted that  it is increasingly difficult to ambulate today with a quad cane.  The  patient comes to the emergency room for further evaluation.   PAST MEDICAL HISTORY:  1. History of multiple sclerosis.  2. Obesity.  3. Vein stripping procedures in the legs, varicose veins.  4. Tonsillectomy.  5. Hypertension.  6. Frequent urinary tract infections with neurogenic bladder.  7. Ovarian cyst.  8. Gastroesophageal reflux disease.  9. Hypercholesterolemia.  10.      Elevations in blood sugars with steroid use.   MEDICATIONS:  1. Prevacid 30 mg tablets one daily.  2. Neurontin 300 mg b.i.d.  3. Baclofen 20 mg q.i.d.  4. Atenolol 50 mg daily.  5. Xanax 0.25 mg t.i.d. p.r.n.  6. Flomax 0.4 mg daily.  7. Aspirin 81 mg a day.  8. Bethanechol 25 mg t.i.d.  9. Wellbutrin 300 mg XL tablets one daily.  10.      Potassium 20 mEq daily.  11.       Zocor 20 mg daily.   Again, the patient does not smoke or drink.   ALLERGIES:  ZOLOFT.   SOCIAL HISTORY:  This patient is married, lives in the [place], [place], area.  Is not employed.  Has two children who alive and well.   FAMILY HISTORY:  Mother died in her sleep of unknown cause.  Father died  with an MI.  The patient has one brother who is alive and well.   REVIEW OF SYMPTOMS:  No fever or chills.  The patient denies headache.  Denies any visual field changes.  Denies neck stiffness.  Had some burning  sensations between his shoulder blades.  Denies shortness of breath, chest  pain.  Denies nausea, vomiting, or abdominal pain.  Does have some  occasional urinary incontinence.  Denies any falls recently.  Has not had  any blackout spells or dizziness.   PHYSICAL EXAMINATION:  GENERAL APPEARANCE:  This patient is a markedly obese  white female who is alert, cooperative at the time of examination.  VITAL SIGNS:  Blood pressure 121/71, heart rate 88, respiratory rate 18,  temperature afebrile.  HEENT:  Head is atraumatic. Eyes:  Pupils are equal, round and reactive to  light.  Discs are flat bilaterally.  NECK:  Supple.  No carotid bruits noted.  LUNGS:  Respiratory examination is clear.  CARDIOVASCULAR:  Regular rate and rhythm with no obvious murmurs or rubs  noted.  ABDOMEN:  Obese, positive bowel sounds.  EXTREMITIES:  There was 1 to 2+ edema in the ankles.  NEUROLOGIC:  Cranial nerves as above.  Facial symmetry.  The patient has  good sensation to face, pinprick and soft touch bilaterally.  There is good  strength to facial muscles and muscles to head turning and shoulder shrug  bilaterally.  Speech is well enunciated and not aphasic.  The patient has  full extraocular movements, no evidence of ophthalmoplegia.  Visual fields  are full to double simultaneous stimulation.  Motor testing reveals good  strength in both upper extremities.  The patient has some  weakness with  iliopsoas muscle on the right, more normal on the left.  Distal strength of  the legs is fairly good.  The patient has fair finger-nose-finger, has  difficulty performing heel-to-shin on the right leg, can perform with the  left.  The patient has decreased pin prick sensation on the right leg as  compared to the left and the left arm as compared to the right.  Vibratory  sensation is again, slightly depressed on the right leg as compared to the  left and more symmetric in the arms.  The patient has fair finger-nose-  finger as stated previously.  Deep tendon reflexes are symmetric.  Toes are  neutral bilaterally.  The patient is able to walk short distances with a  quad cane.   Laboratory values are pending at this time.  Chest x-ray and EKGs are  pending.   IMPRESSION:  1. Multiple sclerosis with recent exacerbation.  2. Hypertension.  3. Obesity.   The patient will be admitted at this point for evaluation and treatment of  an multiple sclerosis exacerbation.  Will need to watch blood sugars on the  IV methylprednisolone.   PLAN:  1. Admit to 3000 unit.  2. IV methylprednisolone 250 mg IV piggyback q.12h.  3. Physical and occupational therapy.  4. Diabetic diet.  5. Will follow the patient's clinical course while in house.  The patient     may need, in the future, to consider the possibility of Novantron given     the relative activity and previous exacerbations with her MS.                                                Marlan Palau, M.D.    CKW/MEDQ  D:  11/03/2003  T:  11/05/2003  Job:  [id number]   cc:   Markham Jordan L. Effie Shy, M.D.  [address]  [place] [address]  Fax: [phone]   Larina Earthly, M.D.  [address]  [address]  [place]  [place] [address]  Fax: [phone]   [facility]  [address]  [address]

## 2011-01-06 NOTE — Discharge Summary (Signed)
NAME:  Toni [name], Toni [name]                        ACCOUNT NO.:  [id number]   MEDICAL RECORD NO.:  [id number]                   PATIENT TYPE:  INP   LOCATION:  3019                                 FACILITY:  [facility]   PHYSICIAN:  Payton Doughty, M.D.                   DATE OF BIRTH:  09/03/1945   DATE OF ADMISSION:  02/12/2004  DATE OF DISCHARGE:  02/15/2004                                 DISCHARGE SUMMARY   ADMISSION DIAGNOSES:  1. Cerebrospinal fluid leak.  2. Fever.   DISCHARGE DIAGNOSES:  1. Cerebrospinal fluid leak.  2. Fever.   PROCEDURE:  She had no procedures.   COMPLICATIONS:  None.   This is a 65 year old right handed white female whose history and physical  is recounted in the chart. She has basically undergone anterior  decompression and fusion June 7. Was readmitted on June 15 with CSF leak.  Underwent repair of the leak, and was discharged home on June 22. She was  readmitted by Dr. Danielle Dess this admission on June 23 with fever, and she had  some upper airway congestion. She was admitted to the hospital and after  observation, the neck mass was markedly decreased. She has had no fever. She  has remained on antibiotics. On exam, her strength is full. Currently, she  has no evidence of leakage. There is mild amount of swelling in her neck  which is minimal compared with her prior difficulties. She does not complain  of a headache.   She is being discharge home now on ciprofloxacin. Her followup will be in  the [facility] office in about a week.                                                Payton Doughty, M.D.    MWR/MEDQ  D:  02/15/2004  T:  02/15/2004  Job:  [id number]

## 2011-01-06 NOTE — H&P (Signed)
NAME:  Toni [name], Toni [name]                        ACCOUNT NO.:  [id number]   MEDICAL RECORD NO.:  [id number]                   PATIENT TYPE:  INP   LOCATION:  3019                                 FACILITY:  [facility]   PHYSICIAN:  Stefani Dama, M.D.               DATE OF BIRTH:  01/29/1946   DATE OF ADMISSION:  02/12/2004  DATE OF DISCHARGE:                                HISTORY & PHYSICAL   ADMITTING DIAGNOSES:  Cerebrospinal fluid fistula, fever.   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  The patient was discharged home however, returned on  February 03, 2004 with a cerebrospinal fluid leak from the incision in her neck.  She was taken to the operating room on February 05, 2004 where she underwent  revision.  Postoperatively she was noted to have a modest mass in the  subcutaneous fascia that developed, she was not leaking a cerebrospinal  fluid and was maintained on Cipro 500 mg b.i.d., she was discharged home on  February 10, 2004, she seemed to do well however, on February 11, 2004 she  complained of some airway congestion and she was seen by Dr. Larina [name], her  primary care physician.  He was concerned because she had a fever of 102  degrees.  I asked her to come to the office and I evaluated her there.  Her  white count was 10.2, sed rate was 72; however, there was a prominent mass  in the left anterior area of the incision.  Patient reported that she had  some very modest difficulty with swallowing and she noted no distinct  difficulty with breathing.  I suggested that she should be admitted to the  hospital however, there were no beds in the hospital.  She was carefully  observed overnight at home.  The next morning she notes that she had some  low-grade fever and I advised admission for further observation of this  process.  She is now being admitted for this  purpose.  I discussed her care  with Dr. Delma [name] who will be on call this weekend.  In light of the  difficulties that she has had with cerebrospinal fluid fistula in the past  we feel that it would be best that she be admitted to the hospital for  observation, we can carefully check her temperature, also repeat her blood  work and obtain some plain radiographs of the cervical spine to see if there  is any evidence of either an infection or worsening airway compromise.   PAST MEDICAL HISTORY:  Past medical history reveals that the patient has a  longstanding history of multiple sclerosis and has been on steroids in the  past.   CURRENT MEDICATIONS:  Her current medications include potassium 20 mEq per  day.  She uses Urecholine 25 mg three times a day for her bladder, Prevacid  30 mg a day for reflux, Zocor 20 mg a day for cholesterol, Neurontin 300 mg  twice daily for nerve pain, baclofen 20 mg three to four times a day for  spasms, Dilacor 50/25 one daily for blood pressure, Wellbutrin 300 mg XL for  depression, aspirin 81 mg a day, calcium 600 mg twice daily, Rebif three  times weekly subcutaneous injections for MS and Centrum Silver.  She notes  that the Zoloft causes swelling.   PHYSICAL EXAMINATION:  She is an alert moderately obese individual appearing  in no overt distress.  She sits straight and  upright and has a significant  mass in the left anterior area of her neck just under her incision.  The  mass is fluctuant, soft, and it is compressible; it feels fluid filled;  there is no drainage from the incision itself; the incision itself is not  reddened; a locking suture of nylon is present within this incision; the  mass itself measures 15 cm in breadth, 6 cm in height and is rounded.  The  neck reveals a minimal amount of motion tenderness to palpation posteriorly  to any motion.  Her motor strength however, is good in the deltoids, biceps,  grips, and intrinsics.   Reflexes are 3+ in the biceps, 3+ in the triceps, 3+  in the patella, 2+ in the Achilles, Babinski's are downgoing.  She is  ambulatory with some modest difficulty.  Cranial nerve examination reveals  the pupils are 3 mm briskly reactive to light and accommodation, the  extraocular movements are full, face is symmetric to grimace, tongue and  uvula are in the midline, sclerae and conjunctivae are clear.  The neck  reveals the findings as noted above.  The abdomen is soft, protuberant,  bowel sounds are positive, no masses are palpable.  Extremities reveal no  cyanosis, clubbing, or edema.   IMPRESSION:  Patient has evidence of what I believe to be a cerebrospinal  fluid fistula under the incision.  She has had some fever and there is some  concern about infection.  She is now being admitted to the hospital to  undergo further observation, particularly in concern of potential for  respiratory difficulties.  If fever is noted she will undergo further blood  cultures.  She will be maintained on Cipro 500 mg twice a day for the  current time.                                                Stefani Dama, M.D.    Toni [name]  D:  02/12/2004  T:  02/14/2004  Job:  [id number]

## 2011-01-06 NOTE — Discharge Summary (Signed)
NAME:  Toni [name], Toni [name]                        ACCOUNT NO.:  [id number]   MEDICAL RECORD NO.:  [id number]                   PATIENT TYPE:  INP   LOCATION:  5729                                 FACILITY:  [facility]   PHYSICIAN:  Genene Churn. Love, M.D.                 DATE OF BIRTH:  October 28, 1945   DATE OF ADMISSION:  09/26/2003  DATE OF DISCHARGE:  09/29/2003                                 DISCHARGE SUMMARY   PATIENT'S ADDRESS:  [address]  [place], [place] [address]   REASON FOR ADMISSION:  This is one of several [facility] admissions  for this 65 year old right-handed white married female from [place],  [place], admitted from the emergency room for evaluation of new-onset  weakness, numbness and tightness.   HISTORY OF PRESENT ILLNESS:  Toni [name] developed loss of vision, double  vision, loss of hearing, dysarthria, spasticity, fatigue and burning in her  mouth and right foot and symptoms over the years after initial attack of  multiple sclerosis in 1989 associated with right-sided weakness and  numbness.  Workup at that time by Dr. Candy Sledge included an MRI  study of the brain, lumbar puncture, and visual-evoked responses diagnostic  of multiple sclerosis. She was placed on Avonex for 1-1/2 years about 7  years ago and then changed to Betaseron and in August of 2001, after a  severe attack of multiple sclerosis, was changed to Rebif.  She has had  attacks of multiple sclerosis about every 6 months to 1 year, the last of  which was in March of 2005, for which she was admitted to [facility], and also had a urinary tract infection.  At that time, she  complained of lower back pain and underwent thoracic and lumbar MRI studies  showing no evidence of multiple sclerosis but evidence of degenerative disk  disease and she received lumbar epidural steroid therapies with good  results.  She also received 3 days of IV Solu-Medrol.  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  She has recently felt poorly with lack of  energy and then had developed progressive right and left mid-thoracic  tightening and drawing in her right foot and leg with right leg numbness  extending to her waist without associated Lhermitte's sign or bowel or  bladder incontinence.  She has had a history of recurrent urinary tract  infection, followed by Dr. Lynelle Smoke I. Tannenbaum.  There was no associated  fever or chills with this illness and she came to the emergency room for  further evaluation.   PAST MEDICAL HISTORY:  Her past medical history is significant for:  1. Multiple sclerosis diagnosed in 1989.  2. Urinary tract infections.  3. Left popliteal thrombophlebitis.  4. Uterine fibroids.  5. Hypertension.  6. Obesity.  7.  Depression.  8. Childhood asthma.  9. Sleep apnea.  10.      Fibroids.  11.      Bulging lumbar disk with degenerative disk disease.  12.      Hyperlipidemia.  13.      Obesity.   MEDICATIONS AT TIME OF ADMISSION:  1. Copaxone 20 mg subcu daily.  2. Bethanechol 25 mg p.o. t.i.d.  3. Prevacid 30 mg daily.  4. Atenolol/hydrochlorothiazide 50/25 mg 1 p.o. daily.  5. Zocor 20 mg daily.  6. Neurontin 300 mg b.i.d.  7. Aspirin 1 daily.  8. CPAP 7 cm at night.  9. Lioresal 20 mg p.o. t.i.d.   ALLERGIES:  She has a history of allergy to ZOLOFT.   COMMENT:  Other details have been dictated elsewhere.   PHYSICAL EXAMINATION:  VITAL SIGNS:  Her examination at time of admission  revealed blood pressure in right and left arms of 120/80.  Heart rate was 60  and regular.  There were no bruits.  Her temperature was 99 degrees.  NEUROLOGIC:  Her examination was remarkable for normal mental status, normal  cranial nerves, normal upper extremity examination but significant weakness  in her right leg with a sensory level to approximately L3 and  then a sensory  level from T9 to T4.  She had increased tone in the lower extremities,  increased reflexes in the lower extremities, bilateral upgoing plantar  responses.  GENERAL:  Her general examination was remarkable for obesity.   LABORATORY DATA:  Laboratory data revealed a hemoglobin of 15.2, hematocrit  of 45.3, white blood cell count was 9100, platelet count 283,000; there were  46 polys, 40% lymphs, 9% monocytes, 4% eosinophils, 1% basophils.  Serum  sodium was 140, potassium 3.1, chloride 100, CO2 content was 32, glucose  125, BUN 12, creatinine 0.8, calcium 9.1.  Urinalysis unremarkable.   HOSPITAL COURSE:  The patient was admitted, placed on high-dose IV Solu-  Medrol at 500 mg b.i.d. for 10 doses but during the hospitalization, refused  Solu-Medrol, noted difficulty with anxiety and suspected panic attacks and  Solu-Medrol had to be decreased to 500 mg per day.  The patient noted  significant improvement in her right-sided weakness and chest tightness  while on this medication.  She did receive Neurontin for her chest tightness  and Xanax was given for her anxiety.  She had significant improvement in her  right-sided symptomatology.  At the time of discharge, CBC and basic  metabolic panel are pending.  Her capillary blood glucoses ranged from 120  to 172 and one high of 203 while in the hospital range.   IMPRESSION:  1. 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  2. Multiple sclerosis, code 340.  3. Obesity, code 278.01.  4. Urinary tract infection, code 599.0.  5. Hypertension, code 796.2.  6. Depression, code 301.  7. Childhood asthma.  8. Sleep apnea, code 787.57.  9. History of fibroids.  10.      History of bulging lumbar disk with lower back pain, code 724.4.  11.      Hyperlipidemia, code __________.   PLAN:  Plan at this time is to discharge the patient on: 1. Solu-Medrol 500 mg IV prior to discharge, 500 mg of IV Solu-Medrol, date     September 30, 2003, and the  discontinue saline well as an outpatient.  2. To begin home prednisone 20 mg tablets 3 p.o. daily. x4 days, then 2 p.o.     daily x4 days, then 1  p.o. daily x5 days with added calcium.  3. Other medicines including added potassium will be per her discharge CBC     and comprehensive metabolic panel; these, however, should include:     a. Copaxone 20 mg subcu daily.     b. Bethanechol 25 mg p.o. t.i.d.     c. Baclofen 20 mg p.o. daily.     d. Prevacid 30 mg p.o. daily.     e. Atenolol/hydrochlorothiazide 50/25 mg daily.     f. Neurontin 300 mg b.i.d.     g. Aspirin 1 p.o. daily.     h. Xanax 0.25 mg p.r.n.        a. Last dose of IV Solu-Medrol on September 30, 2003, to be followed by           p.o. prednisone.     i. Calcium 500 mg b.i.d.     j. Kay Ciel p.r.n.     k. Zocor 20 mg p.o. daily.   CONDITION ON DISCHARGE AND FOLLOWUP:  She is discharged improved from pre-  hospital status and will follow up with Dr. Porfirio Mylar Dohmeier in several  weeks.  She will have a basic metabolic panel on October 05, 2003.                                                Genene Churn. Sandria Manly, M.D.    JML/MEDQ  D:  09/29/2003  T:  09/29/2003  Job:  [id number]   cc:   Larina Earthly, M.D.  [address]  [place]  [place] [address]  Fax: [phone]   Sigmund I. Patsi Sears, M.D.  [address]  [place]  [place] [address]  Fax: [phone]   Melvyn Novas, M.D.  [address]  [address]  [place]  [place] [address]  Fax: [phone]

## 2011-01-06 NOTE — Discharge Summary (Signed)
NAME:  Toni [name], Toni [name]                        ACCOUNT NO.:  [id number]   MEDICAL RECORD NO.:  [id number]                   PATIENT TYPE:  INP   LOCATION:  3024                                 FACILITY:  [facility]   PHYSICIAN:  Payton Doughty, M.D.                   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   PROCEDURE:  C3-4, C4-5, and C5-6 intracervical decompression and fusion with  a reflex hyberplate.   COMPLICATIONS:  None.   DISCHARGE STATUS:  Well.   This is  65 year old right-handed white lady whose history and physical is  recounted on the chart. She has MS. She also has cervical spondylosis and  myelopathy. General examination was intact. Medications are Bactrim,  Neurontin, Wellbutrin, Flomax, atenolol, and interferon.   ALLERGIES:  None.   PHYSICAL EXAMINATION:  GENERAL: Intact.  NEUROLOGIC: Awake and oriented. Cranial nerves intact. Motor exam reveals  5/5 strength throughout the upper and lower extremities. She had sensory  deficits below the knee in a nondermatomal pattern. She is areflexic.  Hoffman's is negative.   She had an MRI that showed cervical spondylosis with spinal cord  compression. She was admitted after ascertaining normal laboratory values  and underwent a C3-4, C4-5, and C5-6 anterior cervical decompression and  fusion. Postoperatively, she did pretty well. She was in the ICU for a  couple of days because of MS. She had some swelling in her neck which was  very superficial. Intraoperatively, although there had been a dural injury,  there was no evidence of pseudomeningocele accumulating. She was up and  about, eating and voiding normally. Steroids have been tapered down. She is  being sent home on 10 mg of prednisone a day, Vicodin for  pain, and  ciprofloxacin. Her follow up will be in the [name] office in about a week for suture removal.                                                Payton Doughty, M.D.    MWR/MEDQ  D:  01/31/2004  T:  01/31/2004  Job:  [id number]

## 2011-01-06 NOTE — H&P (Signed)
NAME:  Toni [name], Toni [name] NO.:  [id number]   MEDICAL RECORD NO.:  [id number]                   PATIENT TYPE:  INP   LOCATION:  3003                                 FACILITY:  [facility]   PHYSICIAN:  Melvyn Novas, M.D.               DATE OF BIRTH:  02-08-1946   DATE OF ADMISSION:  10/23/2002  DATE OF DISCHARGE:                                HISTORY & PHYSICAL   HISTORY OF PRESENT ILLNESS:  The patient is a 65 year old Caucasian right  handed female who is seen today on October 23, 2002 for possible urinary tract  infection and pseudo exacerbation of multiple sclerosis in the emergency  room.  She awoke with pain in the early morning hours and noticed a weakness  in her right leg. She tried to go to the bathroom, fell, and has from this  fall some bruises on her right leg and in the hip region and around the  flank.  She had noticed that she had multiple times a night the need to  urinate and that urination did hurt.  She also felt that her bladder was  never completely emptied.   The patient is a former patient of Dr. Fransisca Connors who has MS since  1989.  Fifteen years ago she was diagnosed after an MRI, a visually evoked  potential and an oligoclonal band all had shown signs compatible to MS and  was placed on Avonex, later on Betaseron on which she had two further  relapses, then on Rebif and interferon beta I alpha.  Her last relapse was  in summer 2003 and was left extremity weakness and numbness but no pain was  associated before and today's spell is therefore somewhat different.  She  had four recent UTI's all associated with fever, increased fatigue and pain  with urination and has been seen by Dr. Jethro Bolus.  Today she has  right sided leg radicular pain  but also abdominal pain and flank pain that  could be UTI related.  Her temperature today is 99.5 at home and 101.3  already in the ER.  She is started on Solu-Medrol 125 mg IV X1 and  feels  that this is already giving her some relief.   REVIEW OF SYMPTOMS:  Blurred vision at night, no diplopia, no extraocular  movement abnormalities, no facial weakness, some memory deficits, no  problems with speech and language.  No myalgia, numbness of the right  perioral region, colon vision changes.   PAST MEDICAL HISTORY:  MS 15 years ago diagnosed.  Frequent bladder  infections.  Ovarian cysts, uterine fibroids.  __________  thrombophlebitis  with negative Doppler studies, left lower extremity only.  Further  hypertension, depression, childhood asthma, now resolved, vein stripping in  1968 at the age of 52, T&A 4.   MEDICATIONS:  At home:  1. Baclofen 20 mg p.o. t.i.d.  2. Neurontin 300 mg  p.o. t.i.d.  3. Wellbutrin 300 mg p.o. daily.  4. Flomax 0.4 mg p.o. daily.  5. Prevacid 30 mg p.o. daily.  6. Atenolol/hydrochlorothiazide 50/25 p.o. daily.  7. Interferon beta I alpha Rebif q. other day, subcu injections Mondays,     Wednesdays and Fridays.   ALLERGIES:  No known drug allergies.   FAMILY HISTORY:  The patient is one of two children of parents.  She has a  healthy brother who suffers from no neurological or other health problems.  Her father died when the patient was already in her 65's, of an MI at the  age of 15 but mother died at age 49 of a cerebrovascular accident.  She had  probably underlying heart disease which was not diagnosed but the patient  remembers that she had CHF-like symptoms with shortness of breath and  swelling of the ankles.   SOCIAL HISTORY:  The patient quit tobacco in 1970.  Very occasional alcohol.  She is married, college educated, has two healthy children.   PHYSICAL EXAMINATION:  GENERAL:  This is a very obese but pleasant patient,  well-kept and well informed about her disease.  She is able to give  chronologically, medically detailed information of her past medical history.  Has an ease of conversation as well as she is able to  ask differentiated  questions regarding her further treatment.  HEENT:  Normocephalic, atraumatic, pupils react equally to light.  No  exudate.  Mucous membranes well perfused.  NECK:  No bruits.  CARDIOVASCULAR:  Regular rate and rhythm, normal S1 and S2, no murmurs.  PULMONARY:  At bases very little air movement.  The patient might have a  barrel chest.  GASTROINTESTINAL:  Positive bowel sounds.  Lower abdominal pain with  palpation, distended bladder.  EXTREMITIES:  Distention of the lower extremities is also noted with  significant edema and varicoses, decreased peripheral pulses noted.  SKIN:  No rash.  The only lesions are bruises from her recent fall, no hyper  or hypopigmentation is noted.  There is an old scar from vein stripping on  the right lower leg.  NEUROLOGICAL:  The patient is alert and oriented X3, very pleasant. She  shows no sign of __________.  No neglect.  Cranial nerves:  Pupils  are  equal, round, reactive to light and accommodation.  Extraocular movements  appear intact.  The patient does have normal fundi in spite of pallor around  the macular as well as the optic nerve papillae.  She does have no  optokinetic reflexes and can follow light and shape very well.  She seems to  have slightly more visual acuity on the right than on the left.  There is no  nystagmus, her gaze is conjugate.  She has equal sensation and motor  strength bilaterally.  She can move tongue and uvula midline and has an easy  elicited gag. Her neck is supple.  Motor examination shows equal strength,  tone and mass of the upper extremities; in the lower extremities there is a  right sided weakness that seems to be weakness from pain especially  exacerbated with hip flexion, retroflexion and knee extension. Dorsiflexion  and plantar flexion of the right foot are slightly weaker than on the left. The patient does have deep tendon reflexes that are trace in the lower  extremities.  Sensory  examination shows vibration intact up to the thigh  level.  Pinprick is intact throughout.  Light touch and position sense seem  to be intact.  Reflexes are symmetric except for the traces only in right  knee and Achilles' tendon.  No clonus.   Gait examination wide based, short steps, somewhat unsteady.  Uses a tripod  cane.  Needs no assistance, is unable to turn or sit down in her bed without  some assistance.  She is evidently not able to bear the same amount of  weight on the right leg due to her lower leg pain and extremity pain.   LABORATORY DATA:  So far the urinalysis is positive for UTI.   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  Bactrim was ordered as the patient has no sulfa allergy.  For the pain we ordered a one time Solu-Medrol and see if this will help  her.  She is also very fatigued and we might put her on Provigil.  Spasticity is increased, she needs Baclofen.  We will monitor the input and  also order a rehabilitation and physical therapy consultation.  It is  recommended that when she leaves the  hospital she will follow up with Dr. Patsi Sears who manages her bladder  infections.  The lower extremity swelling is not unusual for her.  We will  first focus on an MRI of the thoracolumbar spine area should she have more  radicular pain than urinary related flank pain.                                               Melvyn Novas, M.D.    CD/MEDQ  D:  10/25/2002  T:  10/26/2002  Job:  [id number]   cc:   Lynelle Smoke I. Patsi Sears, M.D.  [address]  [place]  [place] [address]  Fax: [phone]

## 2011-01-06 NOTE — H&P (Signed)
NAMELAYLANA, GERWIG              ACCOUNT NO.:  [id number]   MEDICAL RECORD NO.:  [id number]          PATIENT TYPE:  INP   LOCATION:  3039                         FACILITY:  [facility]   PHYSICIAN:  Bevelyn Buckles. Champey, M.D.DATE OF BIRTH:  16-Jul-1946   DATE OF ADMISSION:  10/16/2005  DATE OF DISCHARGE:                                HISTORY & PHYSICAL   HISTORY:  This is a 65 year old Caucasian female with multiple medical  problems including multiple sclerosis who presents for admission with  multiple sclerosis exacerbation/pain syndrome. The patient states for the  last few weeks she has developed worsening back spasms and upper back  spasms. Her spasms started in the left upper back, torso, and left shoulder  a few weeks ago. She was given prednisone taper without any benefit and then  started on hydrocodone and Flexeril without any relief. The pain then  shifted to the right shoulder, upper back, and torso. The patient has been  going to PT and massage therapy with some benefit. Today, the patient  developed worsening spasms on the right side. She also feels like her right  leg might be numb. The patient has had occasional lightheadedness as well.  She denies any new weakness, visual changes, speech changes, vertigo,  swallowing problems, falls, or loss of consciousness.   PAST MEDICAL HISTORY:  Positive for:  1.  MS.  2.  Hypertension.  3.  High cholesterol.  4.  Thyroid problems.   CURRENT MEDICATIONS:  Include atenolol, Rebif, Neurontin, baclofen,  Synthroid.   ALLERGIES:  The patient has drug allergies to ZOLOFT which causes hives.   FAMILY HISTORY:  Positive for heart disease.   SOCIAL HISTORY:  The patient lives with her husband. Denies any tobacco or  alcohol use.   REVIEW OF SYSTEMS:  Positive as per HPI and also positive for low-grade  fever and depression. Review of systems is negative as per HPI in greater  than seven other organ systems.   EXAMINATION:  VITAL  SIGNS:  Temperature is 98.2, blood pressure is 138/74,  pulse is 79, respirations 20, O2 saturation is 97%.  HEENT:  Normocephalic, atraumatic. Extraocular muscles are intact. Pupils  are equal.  NECK:  Supple, no carotid bruits.  HEART:  Regular.  LUNGS:  Clear.  ABDOMEN:  Soft, nontender.  EXTREMITIES:  Show 1+ edema in the lower extremities and good pulses.  NEUROLOGIC:  The patient is awake, alert, and oriented x3. Language, distant  memory, knowledge are within normal limits. Cranial nerves II-XII are  grossly intact. Motor examination shows 4+/5 to 5/5 strength throughout  except for right proximal lower extremity where she has 4/5 strength. The  patient has normal tone in all four extremities; no drift is noted. Sensory  examination:  The patient has decreased sensation to light touch in the  right extremities, greater in the lower extremity than the upper extremity.  Reflexes are trace to 1+ throughout. Toes are neutral bilaterally.  Cerebellar function is within normal limits finger-to-nose. Gait is slightly  wide based, is steady with assistance/support.   LABORATORY DATA:  WBC is 7.1, hemoglobin  is 13.7, hematocrit is 40.2,  platelets 264. Sodium is 134, potassium is 3.5, chloride is 98, CO2 is 29,  BUN 15, creatinine 0.7, glucose 86, calcium was 9.1.   IMPRESSION:  This is a 65 year old with possible multiple sclerosis  exacerbation versus pain syndrome. This case was discussed with Dr.  Vickey Huger, who is directly admitting the patient and she has already written  orders for it. We will admit the patient. We will give her IV steroids which  were ordered by Dr. Vickey Huger, and Percocet p.r.n. We will check MRI of the C  spine, urinalysis to rule out infection. We will continue her home  medication and regimen. We will get PT/OT consults as well and place the  patient on DVT prophylaxis.      Bevelyn Buckles. Nash Shearer, M.D.  Electronically Signed     DRC/MEDQ  D:  10/16/2005   T:  10/17/2005  Job:  [id number]

## 2011-01-06 NOTE — Discharge Summary (Signed)
NAME:  Toni [name], Toni [name] NO.:  [id number]   MEDICAL RECORD NO.:  [id number]                   PATIENT TYPE:  INP   LOCATION:  3003                                 FACILITY:  [facility]   PHYSICIAN:  Melvyn Novas, M.D.               DATE OF BIRTH:  1946/02/26   DATE OF ADMISSION:  10/23/2002  DATE OF DISCHARGE:  10/29/2002                                 DISCHARGE SUMMARY   HISTORY OF PRESENT ILLNESS:  This patient is a 65 year old Caucasian right-  handed female who has been, for many years, followed by Dr. Candy Sledge at [facility] for an MS diagnosis since 1989.  The patient was now admitted with abdominal pain, flank pain and right lower  extremity weakness and pain and cramping, as she described it.   REVIEW OF SYSTEMS:  Her review of systems was otherwise negative.   PAST MEDICAL HISTORY:  1. MS.  2. Frequent bladder infections.  3. Thrombophlebitis.  4. Depression.  5. Childhood asthma.  6. Hypertension.  7. Obesity.  8. Ovarian cysts.  9. Binge drinking at the age of 56.   MEDICATIONS AT HOME:  Medications at home and that the patient will resume:  1. Baclofen 20 mg p.o. t.i.d.  2. Neurontin 300 mg p.o. b.i.d.  3. Wellbutrin 200 mg p.o. daily.  4. Flomax 0.4 mg daily.  5. Prevacid 30 mg p.o. daily.  6. Atenolol and hydrochlorothiazide 50/25 mg daily.  7. Interferon alpha.   ALLERGIES:  No known drug allergies.   HOSPITAL COURSE:  The patient was evaluated by MRI of the spine for her  lower back pain.  It showed, surprisingly, no demyelinating lesions in the  cervical, thoracic or lumbar spine and the patient showed improvement after  using steroid -- Solu-Medrol -- p.r.n. and will be treated as if a  radiculitis and had a neurosurgery consult obtained.  Dr. Emilie Rutter. Roy's  consult stated that he would like to inject the patient three times with  steroids and  lidocaine and the patient has agreed to undergo  this.  She is further  discharged with PT instructions and will follow up at [facility]  here in [place].  A mild urinary tract infection was detected and  treated.  The patient is discharged in improved condition on October 29, 2002.                                               Melvyn Novas, M.D.    CD/MEDQ  D:  01/23/2003  T:  01/24/2003  Job:  [id number]   cc:   Payton Doughty, M.D.  [address]  [place]  [place] [address]  Fax: [phone]   [facility] Demetrio Lapping   Sigmund I.  Patsi Sears, M.D.  [address]  [place]  [place] [address]  Fax: [phone]

## 2011-01-06 NOTE — Op Note (Signed)
NAME:  Toni [name], Toni [name] NO.:  [id number]   MEDICAL RECORD NO.:  [id number]                   PATIENT TYPE:  INP   LOCATION:  3009                                 FACILITY:  [facility]   PHYSICIAN:  Payton Doughty, M.D.                   DATE OF BIRTH:  04/27/1946   DATE OF PROCEDURE:  02/05/2004  DATE OF DISCHARGE:                                 OPERATIVE REPORT   PREOPERATIVE DIAGNOSIS:  Cerebrospinal fluid leak.   POSTOPERATIVE DIAGNOSIS:  Cerebrospinal fluid leak.   PROCEDURE:  Exploration of neck incision for evacuation of cerebrospinal  fluid and repair of cerebrospinal fluid leak.   SURGEON:  Payton Doughty, M.D.   SERVICE:  Neurosurgery   ANESTHESIA:  General endotracheal anesthesia.   PREPARATION:  Betadine and alcohol wipe.   COMPLICATIONS:  Difficult airway.   BODY OF TEXT:  65 year old lady who, two weeks ago, had an anterior cervical  decompression and fusion done at C3-C4.  She developed a CSF leak.  She was  taken to the operating room.  During the course of induction of anesthesia,  it was not possible to intubate her.  At no time did her oxygen saturation  dip below 100.  Because of this, the area of the neck was prepped with  Betadine.  The suture was removed and the CSF collection in the neck was  allowed to drain spontaneously.  It was stapled closed.  The patient then  underwent an awake intubation.  Following the awake intubation, prepped and  draped in the usual sterile fashion.  The staples were removed.  The sutures  in the platysma were removed and the remaining CSF was evacuated.  The plate  was rapidly obtained and at the C3-C4 disc space, there was a small amount  of CSF issuing from around the left side of the bone graft at C3-C4.  This  space was covered with Tisseel and the leak stopped immediately.  It was  observed for some ten minutes and no leaking was apparent and with the  application of Valsalva, there was no leak.   The wound was irrigated and  hemostasis assured.  The platysma was reapproximated with 3-0 Vicryl in an  interrupted fashion, the subcutaneous tissue was reapproximated with 3-0  Vicryl in an interrupted fashion, and the skin was closed with 4-0 nylon in  a running locked fashion.  Betadine and Telfa dressing was applied and the  patient returned to the recovery room in good condition.                                               Payton Doughty, M.D.    MWR/MEDQ  D:  02/05/2004  T:  02/05/2004  Job:  [id number]

## 2011-01-06 NOTE — Discharge Summary (Signed)
NAME:  Toni [name], Toni [name]                        ACCOUNT NO.:  [id number]   MEDICAL RECORD NO.:  [id number]                   PATIENT TYPE:  INP   LOCATION:  3016                                 FACILITY:  [facility]   PHYSICIAN:  Marlan Palau, M.D.               DATE OF BIRTH:  04-30-1946   DATE OF ADMISSION:  11/03/2003  DATE OF DISCHARGE:  11/06/2003                                 DISCHARGE SUMMARY   ADMISSION DIAGNOSES:  1. Multiple sclerosis with recent exacerbation.  2. Hypertension.  3. Obesity.   DISCHARGE DIAGNOSES:  1. Multiple sclerosis with recent exacerbation.  2. Hypertension.  3. Obesity.   PROCEDURES DURING THIS ADMISSION:  Chest x-ray.   COMPLICATIONS:  None.   HISTORY OF PRESENT ILLNESS:  [name] is a 65 year old right-  handed white female born Sep 10, 1945, with a history of multiple  sclerosis dating back to 1989.  The patient has had fairly frequent MS  attacks in the recent past.  The patient was in the hospital approximately  one year ago and was also in the hospital in February of 2005 with an MS  exacerbation.  The patient had just come off of a prednisone taper in the  last 10 to 12 days.  She comes in with another MS exacerbation.  The patient  has had problems with burning sensations of both knees up to the knees,  weakness in the legs, right greater than left, with burning sensation of the  right arm.  The patient has had increasing difficulty with ambulation.  The  patient came to the emergency room for further evaluation.   PAST MEDICAL HISTORY:  1. History of multiple sclerosis.  2. Obesity.  3. Vein stripping procedures in the legs for varicose veins.  4. Tonsillectomy.  5. Hypertension.  6. Urinary tract infections with neurogenic bladder.  7. Ovarian cyst.  8. Gastroesophageal reflux disease.  9. Hypercholesterolemia.  10.      History of elevation in blood sugars with steroid use.   MEDICATIONS PRIOR TO ADMISSION:  1. Prevacid 30 mg one daily.  2. Neurontin 300 mg b.i.d.  3. Baclofen 20 mg q.i.d.  4. Atenolol 50 mg daily.  5. Xanax 0.25 mg t.i.d. p.r.n.  6. Flomax 0.4 mg a day.  7. Aspirin 81 mg a day.  8. Bethanechol 25 mg t.i.d.  9. Wellbutrin XL tablet 300 mg, one a day.  10.      Potassium 20 mEq daily.  11.      Zocor 20 mEq a day.   SOCIAL HISTORY:  The patient does not smoke or drink.   ALLERGIES:  The patient has an allergy to Zoloft.   Please refer to history and physical for social history, family history,  review of systems, and physical examination.   LABORATORY DATA:  Notable for a white count of 8.4, hemoglobin 16.0,  hematocrit 47.3, MCV of  88.1, platelets of 346.  INR 1.1.  Sodium 138,  potassium 2.7, chloride 98, cO2 29, glucose 137, BUN 17, creatinine 0.8,  potassium 9.5.  Total protein 6.7, albumin 3.6.  AST 18, ALT 24, ALP 60.  Total bilirubin 1.1.  Magnesium level 1.9.  Renin activity 1.5 which is  normal.  Aldosterone of 37.5.  This is within normal limits.  Urinalysis  revealed specific gravity of 1.021.  Urine culture was negative.   Chest x-ray showed chronic scars in the left lung base with a calcified  right lower-lobe granuloma.   EKG revealed normal sinus rhythm with occasional premature supraventricular  complexes, age undetermined, heart rate 61.   HOSPITAL COURSE:  This patient was admitted to [facility] for IV  steroid treatment.  The patient was noted to have significant hypokalemia  and was treated for this.  The patient was set up for physical and  occupational therapy.  The possibility of relative Addison's disease was  entertained due to recent taper of prednisone.  The patient was seen by  rehabilitation service and was felt to be an adequate candidate for  outpatient therapy.  The patient seemed to have good improvement with the IV  methylprednisolone  treatment.  The patient was discharged to home with  physical and occupational therapy  as an outpatient.   DISCHARGE MEDICATIONS:  1. Same as admission, Prevacid 30 mg one a day.  2. Neurontin 300 mg b.i.d.  3. Baclofen 20 mg q.i.d.  4. Atenolol 50 mg a day.  5. Xanax 0.25 mg t.i.d. p.r.n.  6. Flomax 0.4 mg a day.  7. Aspirin 81 mg a day.  8. Bethanechol 25 mg t.i.d.  9. Wellbutrin 300 mg XL tablet one a day.  10.      Potassium 20 mEq daily.  11.      Zocor 20 mg daily.   FOLLOWUP:  1. The patient is to follow up with [facility] within     three to six weeks following discharge.     During this admission, the patient was treated with Lovenox 40 mg subcu     daily.  2. The patient was also placed on some Lexapro 10 mg daily.   FOLLOWUP:  The patient will follow up with Dr. Porfirio Mylar Dohmeier.                                                Marlan Palau, M.D.    CKW/MEDQ  D:  12/04/2003  T:  12/06/2003  Job:  [id number]   cc:   [facility]  [address]   Larina Earthly, M.D.  [address]  [place]  [place] [address]  Fax: [phone]

## 2011-01-06 NOTE — Op Note (Signed)
NAME:  Toni [name], Toni [name] NO.:  [id number]   MEDICAL RECORD NO.:  [id number]                   PATIENT TYPE:  INP   LOCATION:  2899                                 FACILITY:  [facility]   PHYSICIAN:  Payton Doughty, M.D.                   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   PROCEDURE:  C3-4, C4-5 and C5-6 anterior cervical decompression and fusion  with reflex hybrid plate.   SURGEON:  Payton Doughty, M.D.   ANESTHESIA:  General endotracheal ___________.   COMPLICATIONS:  CSF leak at C3-4.   NURSE ASSISTANT:  [place].   DOCTOR ASSISTANT:  Hilda Lias, M.D.   INDICATIONS FOR PROCEDURE:  This is a 65 year old lady with MS and cervical  spondylitic myelopathy.   DESCRIPTION OF PROCEDURE:  She is taken to the operating room, given  anesthesia, intubated and placed supine on the operating table. Following  shave, prep and drape in the usual sterile fashion, the skin was incised  from the midline and taken to the level of the sternocleidomastoid muscle on  the left side at the level of the C4-5 interspace.  The platysma was  identified, divided, elevated and undermined.  The sternocleidomastoid was  identified and medial dissection revealed the carotid artery which was  retracted laterally.  The left trachea and esophagus were retracted  laterally to the right.  The dissection was extremely cumbersome because of  the patient's large body habitus. Following identification of the anterior  cervical spine, the longus coli was taken down bilaterally. A marker was  placed and an intraoperative x-ray was taken to confirm the correctness of  the level.  Having confirmed the correctness of the  level, a shadow line  self retraining retractor was placed.  Dissection medially was carried out  at C3-4, C4-5 and C5-C6 under gross observation.  At C3-4 there was a large  anterior osteophyte that had some blood supply at its base that was  controlled readily.  There was significant spondylitic change with a  posteriorly protruding osteophyte that actually was dense adherent to the  dura and with removal came the CSF leak.  This was controlled with Thrombin  Gelfoam and a blood patch was placed over it.  Following complete  decompression at all three levels, 7 mm bone grafts were fashioned with  patellar Allograft and tapped into place.  A reflex hybrid plate was then  selected using 12 mm screws and it was attached with two screws in C3, two  in C4, two in C5 and two at C6.  An intraoperative x-ray showed good  placement of the screws.  The wound was irrigated and hemostasis was  assured. The platysma was reapproximated with 3-0 Vicryl in an interrupted  fashion. The subcutaneous tissue was  reapproximated with 3-0 Vicryl in an interrupted fashion and the skin was  closed with 4-0 nylon in a simple running fashion. A Betadine Telfa dressing  was applied and made occlusive with Op-Site.  The patient was placed in an  Aspen collar and returned to the recovery room.                                               Payton Doughty, M.D.    MWR/MEDQ  D:  01/26/2004  T:  01/26/2004  Job:  [id number]

## 2011-01-06 NOTE — H&P (Signed)
NAME:  Toni [name], Toni [name]                        ACCOUNT NO.:  [id number]   MEDICAL RECORD NO.:  [id number]                   PATIENT TYPE:  INP   LOCATION:  5729                                 FACILITY:  [facility]   PHYSICIAN:  Genene Churn. Love, M.D.                 DATE OF BIRTH:  [birth year]   DATE OF ADMISSION:  09/26/2003  DATE OF DISCHARGE:                                HISTORY & PHYSICAL   PATIENT ADDRESS:  [address] [place], [place] [address].   This 65 year old, right-handed, white, married female from [place], [place], is admitted from the emergency room for evaluation of tightness in  her chest, weakness in her right leg, numbness in her right leg, and  suspected acute multiple sclerosis attack.   HISTORY OF PRESENT ILLNESS:  Toni [name] has a known history of multiple  sclerosis beginning in 1989 when she awoke one morning with right-sided  numbness and weakness for which she was evaluated by Dr. Shireen Quan and  underwent MRI study of the brain, lumbar puncture, and visual evoked  responses.  She was treated with steroids, and approximately seven years ago  she was placed on Avonex for one and one-half years.  Because of lack of  improvement, she was changed to Betaseron, but in August 2001, had a severe  attack and was changed to Rebif.  Her attacks in the past have been  characterized by loss of hearing, loss of vision, double vision, dysarthria,  spasticity, fatigue, tightness in her chest, burning in her mouth and right  foot.  She has had recurrent urinary tract infections and has been seen by  Dr. Jethro Bolus.  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 She has not  felt as well on this medications and wonders if it is effective in her  therapy.  She has tried p.o. steroids in the past and high-dose Solu-Medrol.   On September 25, 2003, she felt bad with lack of energy and then  developed  progressive right and left mid thoracic tightening followed by right foot  and leg numbness and burning extending to her waist without associated  Lhermitte's sign or bowel or bladder incontinence.  She had a temperature of  99 degrees and has a known history of recurrent urinary tract infections and  came to the emergency room.  In the next week, she is to have a D&C by Dr.  Chevis Pretty.   She has not had any falls or accidents. She is independent in her activities  of daily living.  She is able to dress herself, bathe herself, feed herself,  take care of her toilet needs, drive, cook, but does not clean.  She does  not do any shopping.   PAST MEDICAL HISTORY:  1. Sleep apnea.  2. MS diagnosed in 1989.  3. Recurrent urinary tract  infections.  4. Left popliteal thrombophlebitis.  5. Uterine fibroids.  6. Hypertension.  7. Obesity.  8. Depression.  9. Childhood asthma.  10.      Bulging lumbar disk.  11.      Hyperlipidemia.   MEDICATIONS:  1. Copaxone 20 mg subcutaneously daily.  2. Bethanechol 25 mg 1 p.o. t.i.d.  3. Prevacid 30 mg daily.  4. Atenolol/hydrochlorothiazide 50/25 daily.  5. Neurontin 300 mg b.i.d.  6. Aspirin 325 mg daily.  7. CPAP 7 cm at night.   ALLERGIES:  She has a history of allergy to ZOLOFT.   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  She is currently  applying for disability.  She does not smoke cigarettes.  She does not drink  alcohol.   FAMILY HISTORY:  Her mother died at age 101 of unknown cause.  Her father  died at age 35 from myocardial infarction.  She has a brother, 65, living  and well.  She has a daughter 73 and a son 60, living and well.   PHYSICAL EXAMINATION:  GENERAL:  Well-developed, obese white female in no  acute distress.  VITAL SIGNS:  Weight not obtained.   Blood pressure right and left arm 120/80  using her regular blood pressure cuff on her forearm.  Heart rate was 60 and  regular.  There were no bruits.  Temperature was 99 degrees.  NEUROLOGIC:  Mental Status:  She was alert and oriented x 3.  Cranial nerve  examination revealed visual fields full,disks flat, extraocular movements  full, corneals present.  No seventh nerve palsy.  Hearing intact.  Air  conduction greater than bone conduction.  Tongue midline, uvula midline,  gags present.  Motor examination:  4/5 strength in upper extremities, 4/5 in  the right leg, and 5/5 left leg.  She had increased tone in her lower  extremities.  She had sensory level to approximately L3 on the right, then  skipped a level, and then went from approximately T9 through T4 on the  right.  She had some decreased vibration sense in the lower extremity.  Deep  tendon reflexes were increased in the lower extremities.  Plantar responses  were bilaterally upgoing.  HEENT:  Tympanic membranes clear.  LUNGS:  Clear to auscultation.  HEART:  No murmurs.  BREASTS:  Normal.  ABDOMEN:  No enlargement of liver, spleen, or kidneys, but it was very hard  to palpate her abdomen.   IMPRESSION:  1. Myelopathy (Code 721.41).  2. Possible urinary tract infection (Code 599.0).  3. Muscular sclerosis (Code 340).  4. Sleep apnea (Code 780.57).  5. Hypertension (Code 796.2).  6. Obesity (Code 278.01).  7. History of phlebitis.  8. Recurrent urinary tract infections (Code 599.0).  9. History of lower back pain with bulging lumbar disk (Code 724.4).   PLAN:  Admit the patient for high-dose steroids and evaluation of possible  urinary tract infection.                                                Genene Churn. Sandria Manly, M.D.    JML/MEDQ  D:  09/26/2003  T:  09/28/2003  Job:  [id number]   cc:  Larina Earthly, M.D.  [address]  [place]  [place] [address]  Fax: [phone]  Sigmund I. Patsi Sears, M.D.  [address]  [address]  [place]  [place] [address]  Fax: [phone]

## 2011-01-06 NOTE — Op Note (Signed)
NAMELADASIA, SIRCY              ACCOUNT NO.:  [id number]   MEDICAL RECORD NO.:  [id number]          PATIENT TYPE:  AMB   LOCATION:  DAY                          FACILITY:  [facility]   PHYSICIAN:  Howard C. Mezer, M.D.  DATE OF BIRTH:  02/27/1946   DATE OF PROCEDURE:  11/24/2004  DATE OF DISCHARGE:                                 OPERATIVE REPORT   PREOPERATIVE DIAGNOSES:  Endometrial polyp.   POSTOPERATIVE DIAGNOSES:  Endometrial polyp.   OPERATION:  Hysteroscopy D&C.   ANESTHESIA:  General.   SURGEON:  Howard C. Mezer, M.D.   PREPARATION:  Betadine.   INDICATIONS FOR PROCEDURE:  The patient is a 65 year old female with  postmenopausal bleeding who on saline ultrasound had filling defect  suggestive of an endometrial polyp who is admitted for hysteroscopy D&C.  This procedure has been delayed a significant amount of times secondary to  the patient's general medical conditions. She is having this procedure in  the main OR of the [facility] secondary to her morbid obesity and  significant medical conditions.   DESCRIPTION OF PROCEDURE:  With the patient in the lithotomy position, she  was prepped and draped in the routine fashion. Normal saline was used as a  distention medium to decrease the sugar load for the patient and to decrease  the chance for infection. Exposing the cervix and entering the cervix was  made exceptionally difficult secondary to the patient's morbid obesity.  After the cervix was grasped with a tenaculum, the uterus was sounded to 10  1/2 cm. The cervix appeared to be adequately dilated for the diagnostic  scope to enter. The angle that the scope had to be presented to the cervix  did not permit the speculum to remain in the vagina secondary to the  patient's morbid obesity. It was very difficult to advance the scope as it  had to be pressed against the posterior aspect of the vagina firmly and this  reduced significantly the tactile sensation  for the surgeon as the scope  advanced. Fortunately this was able to be passed through the cervix into the  uterus without complications. The endocervical canal appeared to be normal.  On entering the endometrial cavity, two polyps were noted. They appeared to  be benign in their architecture and vascular pattern. There was essentially  no endometrial tissue. The cavity was completely explored as the polyps were  large but mobile and could be manipulated to allow very good visualization  of the cavity. Grasping forceps were introduced, the base of the polyps  loosened. Randall stone forceps were then introduced and the polyps removed.  The cavity appeared to be empty. The cavity was then sharply curetted  productive of a very minimal amount of additional tissue. Because of the  potential risk for perforation, the scope was not reinserted and  the procedure was terminated at this point. The estimated blood loss was  minimal. The sponge, needle and instrument counts were correct. The patient  tolerated the procedure well and was taken to the recovery room in  satisfactory condition.  HCM/MEDQ  D:  11/24/2004  T:  11/24/2004  Job:  [id number]

## 2011-01-06 NOTE — H&P (Signed)
NAME:  Toni [name], Toni [name] NO.:  [id number]   MEDICAL RECORD NO.:  [id number]                   PATIENT TYPE:  INP   LOCATION:  1828                                 FACILITY:  [facility]   PHYSICIAN:  Hewitt Shorts, M.D.            DATE OF BIRTH:  07-12-1946   DATE OF ADMISSION:  02/03/2004  DATE OF DISCHARGE:                                HISTORY & PHYSICAL   HISTORY OF PRESENT ILLNESS:  The patient is a 65 year old right-handed white  female who was under the care of Dr. Trey Sailors and who eight days undergo a  three-level C3-4, C4-5, and C5-6 anterior cervical fusion specifically on  January 26, 2004. The patient was discharged to home three days ago on January 31, 2004.   Her hospital course was noted for intraoperative cerebrospinal fluid leak  that occurred at the C3-4 level. Postoperatively while in the hospital she  had some swelling of her neck. Two days ago after she had returned home she  developed drainage from her wound that has continued. Earlier today Dr. Channing Mutters  sutured the wound further in the office; however, the drainage from the  wound has continued. The drainage is watery with minimal xanthochromia. The  patient called on two occasions concerned about the drainage and requested  re-hospitalization which is certainly reasonable.   Her history is also notable for history of multiple sclerosis, asthma, and  bronchitis. For further details of her past medical history, family history,  social history, they are detailed in Dr. Temple Pacini admission note from last  week.   REVIEW OF SYSTEMS:  Notable for what is described in the history of present  illness and past medical history; otherwise unremarkable.   PHYSICAL EXAMINATION:  GENERAL: The patient is an obese white female in no  acute distress.  VITAL SIGNS: Temperature 98.4, pulse 93, blood pressure 141/78, respiratory  rate 20.  BACK: Her wound shows a large subcutaneous swelling with some slow  watery  drainage that drips from the wound. There is no erythema.  HEENT: She has no hoarseness or dyspnea and in fact she denies any  difficulty swallowing.  LUNGS: Clear to auscultation.  Symmetrical respirations.  HEART: Regular rate and rhythm with S1 and S2. No murmur.  ABDOMEN: Obese, nondistended. Bowel sounds are present.  NEUROLOGIC: The patient is moving all four extremities well. She senses  pinprick throughout. Gait and stance were tested.   DIAGNOSTIC STUDIES:  Laboratories show white blood cell count of 10.2,  hemoglobin 15.0, hematocrit 43.7, sodium 139, potassium 3.4.   IMPRESSION:  Postoperative cerebrospinal fluid leak following anterior  cervical diskectomy fusion by Dr. Channing Mutters eight days ago.   PLAN:  The patient is to be admitted, kept NPO, started on IV fluids, home  medications continued as well as ciprofloxacin which she has been on  apparently since the time of surgery. Dr. Channing Mutters is to evaluate her in the  morning.                                                Hewitt Shorts, M.D.    RWN/MEDQ  D:  02/03/2004  T:  02/03/2004  Job:  [id number]

## 2011-01-06 NOTE — Consult Note (Signed)
NAME:  Toni [name], SAVOIA NO.:  [id number]   MEDICAL RECORD NO.:  [id number]                   PATIENT TYPE:  INP   LOCATION:  3003                                 FACILITY:  [facility]   PHYSICIAN:  Payton Doughty, M.D.                   DATE OF BIRTH:  1946-01-30   DATE OF CONSULTATION:  10/27/2002  DATE OF DISCHARGE:                                   CONSULTATION   HISTORY OF PRESENT ILLNESS:  I was asked to see this 65 year old right-  handed white lady with a history of MS.  She had new complaints of right leg  pain for which she was admitted late last week.  She had low back and  occasional lower extremity pain, bowel and bladder dysfunction over the past  four years associated with her MS.  Her right radicular pain is new.  MRI  was done today and shows a T12-L1 disc which was small without any neural  compromise.   PAST MEDICAL HISTORY:  This is remarkable for MS that is treated with  Interferon.  She has also gotten IV steroids while in the hospital and she  notes that she has had improvement in the leg pain after the steroids.  She  is not a diabetic.  She has frequent bladder infections.   MEDICATIONS:  She is on Baclofen, Neurontin, Wellbutrin, Flomax, Prevacid,  atenolol and Interferon.   ALLERGIES:  No known drug allergies.   PHYSICAL EXAMINATION:  HEENT:  Within normal limits.  NECK:  She has reasonable range of motion of her neck.  CHEST:  Clear.  CARDIOVASCULAR:  Regular rate and rhythm.  ABDOMEN:  Although large, abdomen is nontender.  EXTREMITIES:  Her extremities are also obese but her pulses are good.  NEUROLOGIC:  She is awake, alert and oriented.  Her cranial nerves are  intact.  Motor examination demonstrates 5/5 strength throughout the upper  and lower extremities.  The only sensory deficit is below the knees and  diminished slightly in a nondermatomal pattern.  Reflexes are absent at the  right knee, 2 at left, 1 at the ankles  bilaterally.  Straight leg raising is  negative bilaterally.   Her MRI shows degenerative disc disease with a small slip at 4-5 with mild  lateral recess narrowing.  She has a disc at T12-L1 which was  very small  off to the right.   ASSESSMENT:  Right lumbar radicular pain with disc.  She does not have any  deficit associated with it.  I think the fact that she did well with IV  steroids suggests she would do well with epidural steroids and this should  be pursued.  I do not think that this is an operative lesion.  I discussed  this with the patient and will write for the epidural steroids.  Payton Doughty, M.D.    MWR/MEDQ  D:  10/27/2002  T:  10/28/2002  Job:  [id number]

## 2011-01-06 NOTE — H&P (Signed)
NAME:  Toni [name], Toni [name] NO.:  [id number]   MEDICAL RECORD NO.:  [id number]                   PATIENT TYPE:  INP   LOCATION:                                       FACILITY:  [facility]   PHYSICIAN:  Payton Doughty, M.D.                   DATE OF BIRTH:  [birth year]   DATE OF ADMISSION:  01/26/2004  DATE OF DISCHARGE:                                HISTORY & PHYSICAL   ADMISSION DIAGNOSES:  1. Cervical spondylosis with myelopathy.  2. Multiple sclerosis.   HISTORY OF PRESENT ILLNESS:  This is a now 65 year old right-handed white  lady whom I visited with over a year ago with pain in her back while she was  on Dr. Oliva Bustard service.  She has MS and was being treated with steroids  at that time, and she is also treated with interferon.  She did rather well  with that and then came to my office about a month ago with some complaints  of dysesthesias in her upper extremities with full strength.  MRI showed  significant stenosis at C3-4, C4-5, C5-C6.  Obviously with her MS, it is  difficult to sort out the symptoms that related to MS versus stenosis;  however, my feeling is that, while there is very little I can do for the MS,  I could help the compressive pathology on her spinal cord.  She is,  therefore, admitted for an anterior cervical decompression and fusion at  those levels.   PAST MEDICAL HISTORY:  Her medical history is remarkable for the MS treated  with interferon.  She is not a diabetic, but she does have frequent bladder  infections.   MEDICATIONS:  Baclofen, Neurontin, Wellbutrin, Flomax, Prevacid, atenolol,  and interferon.   ALLERGIES:  No allergies.   SOCIAL HISTORY:  She does not smoke or drink and is disabled.   FAMILY HISTORY:  Not given.   REVIEW OF SYSTEMS:  Remarkable for neck pain, back pain, lower extremity  pain.  She has complaints of difficulty with balance.   PHYSICAL EXAMINATION:  HEENT:  Within normal limits.  NECK:   Reasonable range of motion with no Lhermitte's.  CHEST:  Clear.  CARDIAC:  Regular rate and rhythm.  ABDOMEN:  Nontender with no hepatosplenomegaly.  EXTREMITIES:  Without clubbing or cyanosis.  GU:  Examination deferred.  PERIPHERAL PULSES:  Good.  NEUROLOGIC:  She is awake, alert, and oriented.  Cranial nerves are intact.  Motor exam demonstrates 5/5 strength throughout the upper and lower  extremities.  Sensory deficit below the knees diminished. Non-dermatomal  pattern.  She is areflexic in her lower extremities.  Hoffmann's is  negative.   LABORATORY AND X-RAY DATA:  MRI demonstrates significant stenosis at C3-4,  C4-5, and C5-C6.  There is no abnormal signal in the cord.   CLINICAL IMPRESSION:  Cervical spondylosis with early myelopathy.   PLAN:  Anterior cervical decompression and fusion at C3-4, C4-5, and C5-6.  The rationale for this has been explained above.  The risks and benefits of  this approach have been discussed with her, and she wishes to proceed.                                                Payton Doughty, M.D.    MWR/MEDQ  D:  01/26/2004  T:  01/26/2004  Job:  [id number]

## 2011-01-17 ENCOUNTER — Other Ambulatory Visit (HOSPITAL_COMMUNITY): Payer: Self-pay | Admitting: Gynecology

## 2011-01-17 DIAGNOSIS — N83209 Unspecified ovarian cyst, unspecified side: Secondary | ICD-10-CM

## 2011-01-23 ENCOUNTER — Ambulatory Visit (HOSPITAL_COMMUNITY): Payer: Medicare PPO

## 2011-01-23 ENCOUNTER — Other Ambulatory Visit (HOSPITAL_COMMUNITY): Payer: Self-pay

## 2011-01-30 ENCOUNTER — Ambulatory Visit (HOSPITAL_COMMUNITY): Payer: Medicare PPO

## 2011-01-30 ENCOUNTER — Ambulatory Visit (HOSPITAL_COMMUNITY)
Admission: RE | Admit: 2011-01-30 | Discharge: 2011-01-30 | Disposition: A | Discharge status: Home / Self Care | Payer: Medicare PPO | Source: Ambulatory Visit | Attending: Gynecology | Admitting: Gynecology

## 2011-01-30 DIAGNOSIS — N83209 Unspecified ovarian cyst, unspecified side: Secondary | ICD-10-CM

## 2011-03-10 ENCOUNTER — Emergency Department (HOSPITAL_COMMUNITY)
Admission: EM | Admit: 2011-03-10 | Discharge: 2011-03-10 | Disposition: A | Discharge status: Home / Self Care | Payer: Medicare PPO | Attending: Emergency Medicine | Admitting: Emergency Medicine

## 2011-03-10 ENCOUNTER — Emergency Department (HOSPITAL_COMMUNITY): Payer: Medicare PPO

## 2011-03-10 DIAGNOSIS — Z79899 Other long term (current) drug therapy: Secondary | ICD-10-CM | POA: Insufficient documentation

## 2011-03-10 DIAGNOSIS — I4891 Unspecified atrial fibrillation: Secondary | ICD-10-CM | POA: Insufficient documentation

## 2011-03-10 DIAGNOSIS — Z9889 Other specified postprocedural states: Secondary | ICD-10-CM | POA: Insufficient documentation

## 2011-03-10 DIAGNOSIS — G35 Multiple sclerosis: Secondary | ICD-10-CM | POA: Insufficient documentation

## 2011-03-10 DIAGNOSIS — Z7901 Long term (current) use of anticoagulants: Secondary | ICD-10-CM | POA: Insufficient documentation

## 2011-03-10 DIAGNOSIS — M1990 Unspecified osteoarthritis, unspecified site: Secondary | ICD-10-CM | POA: Insufficient documentation

## 2011-03-10 DIAGNOSIS — R079 Chest pain, unspecified: Secondary | ICD-10-CM | POA: Insufficient documentation

## 2011-03-10 LAB — COMPREHENSIVE METABOLIC PANEL
BUN: 20 mg/dL (ref 6–23)
CO2: 33 mEq/L — ABNORMAL HIGH (ref 19–32)
Chloride: 102 mEq/L (ref 96–112)
Creatinine, Ser: 0.59 mg/dL (ref 0.50–1.10)
GFR calc non Af Amer: >60 mL/min (ref >60)
Total Bilirubin: 0.2 mg/dL — ABNORMAL LOW (ref 0.3–1.2)

## 2011-03-10 LAB — CK TOTAL AND CKMB (NOT AT ARMC)
Relative Index: INVALID (ref 0.0–2.5)
Total CK: 20 U/L (ref 7–177)

## 2011-03-10 LAB — DIFFERENTIAL
Basophils Absolute: 0 10*3/uL (ref 0.0–0.1)
Eosinophils Relative: 0 % (ref 0–5)
Lymphocytes Relative: 11 % — ABNORMAL LOW (ref 12–46)
Neutrophils Relative %: 79 % — ABNORMAL HIGH (ref 43–77)

## 2011-03-10 LAB — CBC
HCT: 41.3 % (ref 36.0–46.0)
Platelets: 207 10*3/uL (ref 150–400)
RBC: 4.63 MIL/uL (ref 3.87–5.11)
RDW: 12.7 % (ref 11.5–15.5)
WBC: 14.5 10*3/uL — ABNORMAL HIGH (ref 4.0–10.5)

## 2011-03-10 LAB — APTT: aPTT: 33 seconds (ref 24–37)

## 2011-03-10 LAB — PROTIME-INR: INR: 3.58 — ABNORMAL HIGH (ref 0.00–1.49)

## 2011-03-10 LAB — TSH: TSH: 0.206 u[IU]/mL — ABNORMAL LOW (ref 0.350–4.500)

## 2011-03-10 LAB — MAGNESIUM: Magnesium: 2.5 mg/dL (ref 1.5–2.5)

## 2011-03-16 NOTE — Consult Note (Signed)
NAME:  KYNNEDI, ZWEIG NO.:  [id number]  MEDICAL RECORD NO.:  [id number]  LOCATION:                                 FACILITY:  PHYSICIAN:  [place] Hilty, MD         DATE OF BIRTH:  1946/04/11  DATE OF CONSULTATION:  03/10/2011 DATE OF DISCHARGE:                                CONSULTATION   REASON FOR CONSULTATION:  Asked by Dr. Vickey Huger to see 65 year old white female that was being treated in Dr. Oliva Bustard office for her multiple sclerosis with IV steroids for fluttery/wave sensation in chest.  HPI: Prior to beginning the steroid, she had a fluttery wave type sensation in her chest that would come and go.  No chest pain, but the patient states but this wave feeling would not go away, unsure if it was cardiac versus anxiety.  Ms. Bockrath has a history of paroxysmal atrial fibrillation and is on Coumadin and also a history of normal coronary arteries in November 2011.  Unfortunately, she has multiple sclerosis and is being treated with IV steroids.  When this occurred, she was given an extra 0.5 mg of Xanax p.o. without much improvement in the symptoms, was brought to [facility] ER for further evaluation.  She continues to feel occasional wave in her chest.  On the monitor, she is maintaining sinus rhythm.  She will occasionally have two PACs and a rare PVC.  She could not feel the PVC when I watched it in the room with her.  She has had IV Solu-Medrol 250 mg Wednesday, 250 mg this past Thursday, which was yesterday and she was to receive 250 today, but she has not had it.  She has also had increased fluid secondary to the steroids and has held her diuretics today secondary to going to receive the steroids.  PAST MEDICAL HISTORY:  Patent coronary arteries in November 2011 and normal EF, paroxysmal atrial fib on Coumadin, multiple sclerosis, hypertension, morbid obesity secondary to steroids, depression, anxiety, obstructive sleep apnea, gastroesophageal reflux  disease and she has had endovenous ablation of the right greater saphenous vein secondary to varicosity that was done in April 2012.  She also had a right lower extremity hematoma afterwards which has resolved.  OUTPATIENT MEDICATIONS: 1. Gabapentin 400 mg 1 time daily. 2. Coumadin 5 mg daily. 3. Bystolic 20 mg daily. 4. Zocor 20 mg every evening. 5. Diltiazem 120 mg daily, 6. Synthroid 75 mcg daily. 7. Flomax 0.4 mg daily. 8. Baclofen 0.05 mg solution per pump. 9. Calcium carbonate 600 plus D2 p.o. daily. 10.Metamucil powder 2 teaspoons daily. 11.Multivitamins daily 1 tablet daily. 12.Macrodantin 100 mg daily. 13.Baclofen 20 mg tablets one p.o. up to 4 times a day p.o. 14.Bupropion 300 mg XL one daily. 15.Bethanechol chloride 10 mg daily. 16.Alprazolam 1 mg she takes half a tablet to 1 tablet as needed     p.r.n. 17.Vitamin D tablet 500 mg daily. 18.Kristalose 10 mg pack 1-2 daily. 19.Cetirizine 10 mg daily. 20.Betaseron 0.3 mg take as directed. 21.Omeprazole 20 mg 1-2 daily. 22.Coumadin as stated. 23.Furosemide 20 mg tablet twice a day. 24.Prednisone 10 mg p.r.n. dose packs. 25.Potassium 20 mEq p.r.n.  ALLERGIES:  CHLORAPREP. HYDROCODONE, and ZOLOFT.  FAMILY HISTORY:  Not significant this consult.  SOCIAL HISTORY:  Married.  Her husband is with her today.  No tobacco. No alcohol.  PHYSICAL EXAMINATION:  VITAL SIGNS:  Blood pressure 146/60 to 149/65, pulse 70, resp is 20, temp 98.6, oxygen saturation 99% room air. GENERAL:  Alert and oriented x3.  Moves all extremities, follows commands, pleasant affect. SKIN:  Warm and dry.  Brisk capillary refill. HEENT:  Normocephalic.  Sclerae clear. NECK:  Supple.  No JVD. HEART:  Regular rate and rhythm.  No obvious murmur, S1 and S2. LUNGS:  Clear without rales, rhonchi, or wheezes. ABDOMEN:  Soft, nontender, positive bowel sounds, obese. EXTREMITIES:  Nonpitting edema large legs, positive varicosities, and 2+ pedal  pulses. NEUROLOGIC:  Alert and oriented x3, follows commands.  Chest x-ray, no acute cardiopulmonary process.  LABORATORY DATA:  Hemoglobin 14.1, hematocrit 41.3, platelets 207.  WBC was elevated at 14.5, but with IV steroids we would expect that.  Sodium 139, potassium 4.3, BUN 20, creatinine 0.59, glucose 104, troponin-I less than 0.30, CK 20, MB 1.6, INR was 3.58.  EKG, no acute changes.  IMPRESSION: 1. Wave feeling in chest.  No arrhythmias noted.  Cardiac enzyme was     negative. 2. History of normal coronary arteries November 2011 with normal     ejection fraction. 3. Paroxysmal atrial fibrillation maintaining sinus rhythm. 4. Anticoagulation, anticoagulated. 5. Multiple sclerosis. 6. Obstructive sleep apnea. 7. Hypothyroidism.  PLAN:  Dr. Rennis Golden saw and examined the patient, felt it was atypical chest discomfort, negative cardiac cath in the past for coronary disease. He felt it was fine for patient to be discharged from the ER and will follow up with him in the office on March 20, 2011.  We did adjust her Coumadin.  No Coumadin today, March 10, 2011, 2.5 mg of Coumadin on Saturday March 11, 2011, and Sunday March 12, 2011, then 5 mg on Monday March 13, 2011, and she will have a [facility] appointment on March 14, 2011, at 10:30 a.m.  She will call us if she has further problems.     Darcella Gasman. Annie Paras, N.P.   ______________________________ [place] Hilty, MD    LRI/MEDQ  D:  03/10/2011  T:  03/11/2011  Job:  [id number]  cc:   Melvyn Novas, M.D. Larina Earthly, M.D.  Electronically Signed by Nada Boozer N.P. on 03/12/2011 06:47:44 PM Electronically Signed by Kirtland Bouchard. HILTY M.D. on 03/16/2011 06:37:36 PM

## 2011-05-12 LAB — BASIC METABOLIC PANEL
BUN: 16
Calcium: 9.3
Creatinine, Ser: 0.79
GFR calc non Af Amer: >60

## 2011-05-12 LAB — CBC
Platelets: 223
WBC: 7.6

## 2011-05-22 LAB — CD4/CD8 (T-HELPER/T-SUPPRESSOR CELL)
CD4 absolute: 2080 — ABNORMAL HIGH
CD4%: 51
CD8tox: 13 — ABNORMAL LOW
Total lymphocyte count: 4010 — ABNORMAL HIGH

## 2011-06-01 LAB — CBC
HCT: 39.6
MCV: 91.4
Platelets: 265
WBC: 11.4 — ABNORMAL HIGH

## 2011-06-01 LAB — DIFFERENTIAL
Basophils Absolute: 0
Basophils Relative: 0
Eosinophils Absolute: 0.5
Eosinophils Relative: 4
Lymphocytes Relative: 33

## 2011-06-01 LAB — URINALYSIS, ROUTINE W REFLEX MICROSCOPIC
Protein, ur: NEGATIVE
Urobilinogen, UA: 0.2

## 2011-06-01 LAB — D-DIMER, QUANTITATIVE: D-Dimer, Quant: 0.3

## 2011-06-01 LAB — COMPREHENSIVE METABOLIC PANEL
ALT: 19
AST: 17
CO2: 28
Chloride: 100
Creatinine, Ser: 0.81
GFR calc Af Amer: >60
GFR calc non Af Amer: >60
Total Bilirubin: 0.9

## 2011-06-01 LAB — TROPONIN I: Troponin I: 0.01

## 2011-11-20 DIAGNOSIS — J189 Pneumonia, unspecified organism: Secondary | ICD-10-CM

## 2011-11-20 HISTORY — DX: Pneumonia, unspecified organism: J18.9

## 2011-11-26 ENCOUNTER — Emergency Department (HOSPITAL_COMMUNITY): Payer: Medicare Other

## 2011-11-26 ENCOUNTER — Inpatient Hospital Stay (HOSPITAL_COMMUNITY)
Admission: EM | Admit: 2011-11-26 | Discharge: 2011-11-30 | DRG: 178 | Disposition: A | Discharge status: Home Health | Payer: Medicare Other | Attending: Internal Medicine | Admitting: Internal Medicine

## 2011-11-26 ENCOUNTER — Other Ambulatory Visit: Payer: Self-pay

## 2011-11-26 ENCOUNTER — Encounter (HOSPITAL_COMMUNITY): Payer: Self-pay | Admitting: *Deleted

## 2011-11-26 DIAGNOSIS — Z7901 Long term (current) use of anticoagulants: Secondary | ICD-10-CM

## 2011-11-26 DIAGNOSIS — Z981 Arthrodesis status: Secondary | ICD-10-CM

## 2011-11-26 DIAGNOSIS — R7309 Other abnormal glucose: Secondary | ICD-10-CM | POA: Diagnosis present

## 2011-11-26 DIAGNOSIS — Z79899 Other long term (current) drug therapy: Secondary | ICD-10-CM

## 2011-11-26 DIAGNOSIS — I872 Venous insufficiency (chronic) (peripheral): Secondary | ICD-10-CM | POA: Diagnosis present

## 2011-11-26 DIAGNOSIS — Z87891 Personal history of nicotine dependence: Secondary | ICD-10-CM

## 2011-11-26 DIAGNOSIS — I10 Essential (primary) hypertension: Secondary | ICD-10-CM | POA: Diagnosis present

## 2011-11-26 DIAGNOSIS — E785 Hyperlipidemia, unspecified: Secondary | ICD-10-CM | POA: Diagnosis present

## 2011-11-26 DIAGNOSIS — F329 Major depressive disorder, single episode, unspecified: Secondary | ICD-10-CM | POA: Diagnosis present

## 2011-11-26 DIAGNOSIS — I4891 Unspecified atrial fibrillation: Secondary | ICD-10-CM | POA: Diagnosis present

## 2011-11-26 DIAGNOSIS — G35 Multiple sclerosis: Secondary | ICD-10-CM | POA: Diagnosis present

## 2011-11-26 DIAGNOSIS — Z6841 Body Mass Index (BMI) 40.0 and over, adult: Secondary | ICD-10-CM

## 2011-11-26 DIAGNOSIS — J189 Pneumonia, unspecified organism: Secondary | ICD-10-CM

## 2011-11-26 DIAGNOSIS — E039 Hypothyroidism, unspecified: Secondary | ICD-10-CM | POA: Diagnosis present

## 2011-11-26 DIAGNOSIS — E079 Disorder of thyroid, unspecified: Secondary | ICD-10-CM | POA: Diagnosis present

## 2011-11-26 DIAGNOSIS — J690 Pneumonitis due to inhalation of food and vomit: Principal | ICD-10-CM | POA: Diagnosis present

## 2011-11-26 DIAGNOSIS — E876 Hypokalemia: Secondary | ICD-10-CM | POA: Diagnosis not present

## 2011-11-26 DIAGNOSIS — F3289 Other specified depressive episodes: Secondary | ICD-10-CM | POA: Diagnosis present

## 2011-11-26 DIAGNOSIS — G35B0 Primary progressive multiple sclerosis, unspecified: Secondary | ICD-10-CM | POA: Diagnosis present

## 2011-11-26 HISTORY — DX: Multiple sclerosis: G35

## 2011-11-26 HISTORY — DX: Unspecified atrial fibrillation: I48.91

## 2011-11-26 HISTORY — DX: Pneumonia, unspecified organism: J18.9

## 2011-11-26 HISTORY — DX: Essential (primary) hypertension: I10

## 2011-11-26 HISTORY — DX: Disorder of thyroid, unspecified: E07.9

## 2011-11-26 HISTORY — DX: Sleep apnea, unspecified: G47.30

## 2011-11-26 HISTORY — DX: Hyperlipidemia, unspecified: E78.5

## 2011-11-26 HISTORY — DX: Shortness of breath: R06.02

## 2011-11-26 LAB — DIFFERENTIAL
Basophils Absolute: 0 10*3/uL (ref 0.0–0.1)
Basophils Relative: 1 % (ref 0–1)
Eosinophils Relative: 3 % (ref 0–5)
Lymphocytes Relative: 15 % (ref 12–46)
Neutro Abs: 4.2 10*3/uL (ref 1.7–7.7)

## 2011-11-26 LAB — POCT I-STAT, CHEM 8
BUN: 15 mg/dL (ref 6–23)
Calcium, Ion: 1.07 mmol/L — ABNORMAL LOW (ref 1.12–1.32)
Glucose, Bld: 116 mg/dL — ABNORMAL HIGH (ref 70–99)
TCO2: 30 mmol/L (ref 0–100)

## 2011-11-26 LAB — CBC
Platelets: 193 10*3/uL (ref 150–400)
RDW: 12.6 % (ref 11.5–15.5)
WBC: 6.3 10*3/uL (ref 4.0–10.5)

## 2011-11-26 LAB — URINALYSIS, ROUTINE W REFLEX MICROSCOPIC
Ketones, ur: 15 mg/dL — AB
Leukocytes, UA: NEGATIVE
Nitrite: NEGATIVE
Specific Gravity, Urine: 1.023 (ref 1.005–1.030)
Urobilinogen, UA: 0.2 mg/dL (ref 0.0–1.0)
pH: 6.5 (ref 5.0–8.0)

## 2011-11-26 LAB — PROTIME-INR: INR: 3.24 — ABNORMAL HIGH (ref 0.00–1.49)

## 2011-11-26 MED ORDER — ADULT MULTIVITAMIN W/MINERALS CH
1.0000 | ORAL_TABLET | Freq: Every day | ORAL | Status: DC
  Administered 2011-11-27 – 2011-11-30 (×4): 1 via ORAL
  Filled 2011-11-26 (×4): qty 1

## 2011-11-26 MED ORDER — DILTIAZEM HCL ER 120 MG PO CP24
120.0000 mg | ORAL_CAPSULE | Freq: Every day | ORAL | Status: DC
  Administered 2011-11-27 – 2011-11-30 (×4): 120 mg via ORAL
  Filled 2011-11-26 (×4): qty 1

## 2011-11-26 MED ORDER — MOXIFLOXACIN HCL IN NACL 400 MG/250ML IV SOLN
400.0000 mg | Freq: Once | INTRAVENOUS | Status: AC
Start: 2011-11-26 — End: 2011-11-26
  Administered 2011-11-26: 400 mg via INTRAVENOUS
  Filled 2011-11-26: qty 250

## 2011-11-26 MED ORDER — IOHEXOL 300 MG/ML  SOLN: 80.0000 mL | Freq: Once | INTRAMUSCULAR | Status: AC | PRN

## 2011-11-26 MED ORDER — LEVOTHYROXINE SODIUM 75 MCG PO TABS
75.0000 ug | ORAL_TABLET | Freq: Every day | ORAL | Status: DC
  Administered 2011-11-27 – 2011-11-30 (×4): 75 ug via ORAL
  Filled 2011-11-26 (×6): qty 1

## 2011-11-26 MED ORDER — BENEFIBER PO PACK: 1.0000 | PACK | Freq: Two times a day (BID) | ORAL | Status: DC

## 2011-11-26 MED ORDER — CALCIUM CARBONATE 1250 (500 CA) MG PO TABS
1.0000 | ORAL_TABLET | Freq: Two times a day (BID) | ORAL | Status: DC
  Administered 2011-11-26 – 2011-11-30 (×8): 500 mg via ORAL
  Filled 2011-11-26 (×10): qty 1

## 2011-11-26 MED ORDER — BACLOFEN 20 MG PO TABS
20.0000 mg | ORAL_TABLET | Freq: Two times a day (BID) | ORAL | Status: DC
Start: 2011-11-26 — End: 2011-11-30
  Administered 2011-11-27 – 2011-11-30 (×6): 20 mg via ORAL
  Filled 2011-11-26 (×9): qty 1

## 2011-11-26 MED ORDER — METHYLPREDNISOLONE SODIUM SUCC 125 MG IJ SOLR
80.0000 mg | Freq: Two times a day (BID) | INTRAMUSCULAR | Status: DC
  Administered 2011-11-26 – 2011-11-30 (×8): 80 mg via INTRAVENOUS
  Filled 2011-11-26 (×8): qty 1.28

## 2011-11-26 MED ORDER — LACTULOSE 10 GM/15ML PO SOLN
10.0000 g | Freq: Two times a day (BID) | ORAL | Status: DC
  Administered 2011-11-26 – 2011-11-29 (×4): 10 g via ORAL
  Filled 2011-11-26 (×9): qty 15

## 2011-11-26 MED ORDER — ATORVASTATIN CALCIUM 10 MG PO TABS
10.0000 mg | ORAL_TABLET | Freq: Every day | ORAL | Status: DC
  Administered 2011-11-26 – 2011-11-29 (×4): 10 mg via ORAL
  Filled 2011-11-26 (×5): qty 1

## 2011-11-26 MED ORDER — WARFARIN - PHARMACIST DOSING INPATIENT: Freq: Every day | Status: DC

## 2011-11-26 MED ORDER — VITAMIN D3 25 MCG (1000 UNIT) PO TABS
1000.0000 [IU] | ORAL_TABLET | Freq: Every day | ORAL | Status: DC
  Administered 2011-11-27 – 2011-11-30 (×4): 1000 [IU] via ORAL
  Filled 2011-11-26 (×4): qty 1

## 2011-11-26 MED ORDER — TAMSULOSIN HCL 0.4 MG PO CAPS
0.4000 mg | ORAL_CAPSULE | Freq: Every day | ORAL | Status: DC
  Administered 2011-11-27 – 2011-11-30 (×4): 0.4 mg via ORAL
  Filled 2011-11-26 (×4): qty 1

## 2011-11-26 MED ORDER — NEBIVOLOL HCL 10 MG PO TABS
20.0000 mg | ORAL_TABLET | Freq: Every day | ORAL | Status: DC
  Administered 2011-11-27 – 2011-11-30 (×4): 20 mg via ORAL
  Filled 2011-11-26 (×4): qty 2

## 2011-11-26 MED ORDER — GABAPENTIN 300 MG PO CAPS
600.0000 mg | ORAL_CAPSULE | Freq: Three times a day (TID) | ORAL | Status: DC
  Filled 2011-11-26: qty 2

## 2011-11-26 MED ORDER — IOHEXOL 300 MG/ML  SOLN
80.0000 mL | Freq: Once | INTRAMUSCULAR | Status: AC | PRN
  Administered 2011-11-26: 80 mL via INTRAVENOUS

## 2011-11-26 MED ORDER — PIPERACILLIN-TAZOBACTAM 3.375 G IVPB 30 MIN
3.3750 g | Freq: Three times a day (TID) | INTRAVENOUS | Status: DC
  Administered 2011-11-26 – 2011-11-27 (×2): 3.375 g via INTRAVENOUS
  Filled 2011-11-26 (×4): qty 50

## 2011-11-26 MED ORDER — ALPRAZOLAM 0.5 MG PO TABS
0.5000 mg | ORAL_TABLET | Freq: Two times a day (BID) | ORAL | Status: DC | PRN
  Administered 2011-11-26 – 2011-11-29 (×3): 0.5 mg via ORAL
  Filled 2011-11-26 (×3): qty 1

## 2011-11-26 MED ORDER — GABAPENTIN 300 MG PO CAPS
300.0000 mg | ORAL_CAPSULE | Freq: Three times a day (TID) | ORAL | Status: DC
  Administered 2011-11-26 – 2011-11-30 (×11): 300 mg via ORAL
  Filled 2011-11-26 (×13): qty 1

## 2011-11-26 MED ORDER — BUPROPION HCL ER (XL) 300 MG PO TB24
300.0000 mg | ORAL_TABLET | Freq: Every day | ORAL | Status: DC
  Administered 2011-11-27 – 2011-11-30 (×4): 300 mg via ORAL
  Filled 2011-11-26 (×4): qty 1

## 2011-11-26 MED ORDER — CALCIUM CARBONATE 600 MG PO TABS
600.0000 mg | ORAL_TABLET | Freq: Two times a day (BID) | ORAL | Status: DC
  Filled 2011-11-26 (×2): qty 1

## 2011-11-26 MED ORDER — PANTOPRAZOLE SODIUM 40 MG PO TBEC
40.0000 mg | DELAYED_RELEASE_TABLET | Freq: Every day | ORAL | Status: DC
  Administered 2011-11-27 – 2011-11-30 (×4): 40 mg via ORAL
  Filled 2011-11-26 (×4): qty 1

## 2011-11-26 MED ORDER — SENNA 8.6 MG PO TABS
1.0000 | ORAL_TABLET | Freq: Two times a day (BID) | ORAL | Status: DC
  Administered 2011-11-26 – 2011-11-30 (×8): 8.6 mg via ORAL
  Filled 2011-11-26 (×9): qty 1

## 2011-11-26 MED ORDER — METHYLPREDNISOLONE SODIUM SUCC 125 MG IJ SOLR
INTRAMUSCULAR | Status: AC
  Filled 2011-11-26: qty 2

## 2011-11-26 MED ORDER — VITAMIN D-3 125 MCG (5000 UT) PO TABS: 1.0000 | ORAL_TABLET | Freq: Every day | ORAL | Status: DC

## 2011-11-26 MED ORDER — FUROSEMIDE 40 MG PO TABS
40.0000 mg | ORAL_TABLET | Freq: Two times a day (BID) | ORAL | Status: DC
  Administered 2011-11-26 – 2011-11-28 (×4): 40 mg via ORAL
  Filled 2011-11-26 (×5): qty 1

## 2011-11-26 MED ORDER — PSYLLIUM 95 % PO PACK
1.0000 | PACK | Freq: Two times a day (BID) | ORAL | Status: DC
  Administered 2011-11-26 – 2011-11-30 (×7): 1 via ORAL
  Filled 2011-11-26 (×10): qty 1

## 2011-11-26 MED ORDER — BETHANECHOL CHLORIDE 25 MG PO TABS
25.0000 mg | ORAL_TABLET | Freq: Two times a day (BID) | ORAL | Status: DC
  Administered 2011-11-26 – 2011-11-30 (×6): 25 mg via ORAL
  Filled 2011-11-26 (×9): qty 1

## 2011-11-26 MED ORDER — OMEGA-3-ACID ETHYL ESTERS 1 G PO CAPS
1.0000 g | ORAL_CAPSULE | Freq: Every day | ORAL | Status: DC
  Administered 2011-11-27 – 2011-11-30 (×4): 1 g via ORAL
  Filled 2011-11-26 (×4): qty 1

## 2011-11-26 MED ORDER — SODIUM CHLORIDE 0.9 % IJ SOLN
3.0000 mL | Freq: Two times a day (BID) | INTRAMUSCULAR | Status: DC
  Administered 2011-11-26 – 2011-11-30 (×8): 3 mL via INTRAVENOUS

## 2011-11-26 MED ORDER — FISH OIL 600 MG PO CAPS: 1200.0000 mg | ORAL_CAPSULE | Freq: Every day | ORAL | Status: DC

## 2011-11-26 MED ORDER — MOXIFLOXACIN HCL IN NACL 400 MG/250ML IV SOLN
400.0000 mg | Freq: Once | INTRAVENOUS | Status: AC
  Administered 2011-11-26: 400 mg via INTRAVENOUS
  Filled 2011-11-26: qty 250

## 2011-11-26 NOTE — ED Notes (Signed)
Pt has MS and thinks it is being exacerbating and causing weakness on right side since yesterday

## 2011-11-26 NOTE — ED Provider Notes (Signed)
Medical screening examination/treatment/procedure(s) were conducted as a shared visit with non-physician practitioner(s) and myself.  I personally evaluated the patient during the encounter 66 yo morbidly obese lady had had cough and shortness of breath, placed on a Z-Pak by her PCP a couple of days ago.  Lungs sounded clear, but chest xray showed a round infiltrate in the right upper lobe, which on CT was shown to be a pneumonia.  Admitted for IV antibiotics.   Carleene Cooper III, MD 11/26/11 2252

## 2011-11-26 NOTE — ED Provider Notes (Signed)
History     CSN: [id number]  Arrival date & time 11/26/11  1045   First MD Initiated Contact with Patient 11/26/11 1216      Chief Complaint  Patient presents with  . Shortness of Breath    (Consider location/radiation/quality/duration/timing/severity/associated sxs/prior treatment) HPI  66 year old female with history of afib on coumadin and multiple sclerosis presents with a chief complaint of shortness of breath and nonproductive cough. Patient states for the past several days she has been having increased shortness of breath, throat irritation, and cough productive with yellow sputum. she reports hearing rattling sounds with breathing. Said she feels more tired than usual. The onset was gradual, persistent, and getting worse. She is now having fever up to 101F this morning. She has a followup at her PCPs office and was diagnosed with bronchitis, given Z-Pak, Robitussin, and Mucinex. Patient states medication has not provided adequate relief. She does recall having IV steroid treatment a week ago for her MS. Denies recent hospitalization.    Patient denies any unexpected weight changes, night sweats, myalgias, or rash. She is not a smoker. She has no prior history of cancer. Patient does have a history of atrial fibrillation and is taking Coumadin, and calcium channel blocker.Has been taking the new medication she has been having edema, and is currently taking Lasix for it.  Denies any significant weight gain.    Past Medical History  Diagnosis Date  . Multiple sclerosis   . Hypertension   . Atrial fibrillation   . Thyroid disease     Past Surgical History  Procedure Date  . Cervical fusion     with correction  . Infusion pump implantation     baclofen infusion in lower abd    No family history on file.  History  Substance Use Topics  . Smoking status: Former Games developer  . Smokeless tobacco: Not on file  . Alcohol Use: No    OB History    Grav Para Term Preterm Abortions  TAB SAB Ect Mult Living                  Review of Systems  All other systems reviewed and are negative.    Allergies  Chloraprep one step; Hydrocodone; and Zoloft  Home Medications  No current outpatient prescriptions on file.  BP 125/53  Pulse 89  Temp(Src) 99.2 F (37.3 C) (Oral)  Resp 20  SpO2 95%  Physical Exam  Nursing note and vitals reviewed. Constitutional: She appears well-developed and well-nourished. No distress.       Awake, alert, nontoxic appearance. Obese  HENT:  Head: Atraumatic.  Eyes: Conjunctivae are normal. Right eye exhibits no discharge. Left eye exhibits no discharge.  Neck: Neck supple.  Cardiovascular: Normal rate and regular rhythm.   Pulmonary/Chest: Effort normal and breath sounds normal. No respiratory distress. She has no wheezes. She has no rales. She exhibits no tenderness.  Abdominal: Soft. There is no tenderness. There is no rebound.  Musculoskeletal: She exhibits edema. She exhibits no tenderness.       ROM appears intact, no obvious focal weakness. 2+ bilateral pedal edema noted.  No calf tenderness. Negative Homans sign  Neurological:       Mental status and motor strength appears intact  Skin: No rash noted.  Psychiatric: She has a normal mood and affect.    ED Course  Procedures (including critical care time)  Labs Reviewed - No data to display Dg Chest 2 View  11/26/2011  *RADIOLOGY REPORT*  Clinical Data: Shortness of breath  CHEST - 2 VIEW  Comparison: 03/10/2011  Findings: Cardiomediastinal silhouette is stable.  There is no acute infiltrate or pulmonary edema.  There is a nodular mass like consolidation in the right upper lobe measures 3.7 cm.  Although this may be due to focal pneumonia, pulmonary mass cannot be excluded.  Further evaluation with CT scan of the chest with IV contrast is recommended.  IMPRESSION:  There is no acute infiltrate or pulmonary edema.  There is a nodular mass like consolidation in the right upper  lobe measures 3.7 cm.  Although this may be due to focal pneumonia, pulmonary mass cannot be excluded.  Further evaluation with CT scan of the chest with IV contrast is recommended.  Original Report Authenticated By: Natasha Mead, M.D.     No diagnosis found.  2:22 PM   Date: 11/26/2011  Rate: 86  Rhythm: normal sinus rhythm  QRS Axis: normal  Intervals: normal  ST/T Wave abnormalities: normal  Conduction Disutrbances:none  Narrative Interpretation:   Old EKG Reviewed: unchanged  Results for orders placed during the hospital encounter of 11/26/11  CBC      Component Value Range   WBC 6.3  4.0 - 10.5 (K/uL)   RBC 4.71  3.87 - 5.11 (MIL/uL)   Hemoglobin 14.7  12.0 - 15.0 (g/dL)   HCT 41.3  24.4 - 01.0 (%)   MCV 90.7  78.0 - 100.0 (fL)   MCH 31.2  26.0 - 34.0 (pg)   MCHC 34.4  30.0 - 36.0 (g/dL)   RDW 27.2  53.6 - 64.4 (%)   Platelets 193  150 - 400 (K/uL)  DIFFERENTIAL      Component Value Range   Neutrophils Relative 66  43 - 77 (%)   Neutro Abs 4.2  1.7 - 7.7 (K/uL)   Lymphocytes Relative 15  12 - 46 (%)   Lymphs Abs 0.9  0.7 - 4.0 (K/uL)   Monocytes Relative 16 (*) 3 - 12 (%)   Monocytes Absolute 1.0  0.1 - 1.0 (K/uL)   Eosinophils Relative 3  0 - 5 (%)   Eosinophils Absolute 0.2  0.0 - 0.7 (K/uL)   Basophils Relative 1  0 - 1 (%)   Basophils Absolute 0.0  0.0 - 0.1 (K/uL)  URINALYSIS, ROUTINE W REFLEX MICROSCOPIC      Component Value Range   Color, Urine YELLOW  YELLOW    APPearance CLEAR  CLEAR    Specific Gravity, Urine 1.023  1.005 - 1.030    pH 6.5  5.0 - 8.0    Glucose, UA NEGATIVE  NEGATIVE (mg/dL)   Hgb urine dipstick NEGATIVE  NEGATIVE    Bilirubin Urine NEGATIVE  NEGATIVE    Ketones, ur 15 (*) NEGATIVE (mg/dL)   Protein, ur NEGATIVE  NEGATIVE (mg/dL)   Urobilinogen, UA 0.2  0.0 - 1.0 (mg/dL)   Nitrite NEGATIVE  NEGATIVE    Leukocytes, UA NEGATIVE  NEGATIVE   TROPONIN I      Component Value Range   Troponin I <0.30  <0.30 (ng/mL)  PROTIME-INR       Component Value Range   Prothrombin Time 33.6 (*) 11.6 - 15.2 (seconds)   INR 3.24 (*) 0.00 - 1.49   POCT I-STAT, CHEM 8      Component Value Range   Sodium 138  135 - 145 (mEq/L)   Potassium 4.0  3.5 - 5.1 (mEq/L)   Chloride 99  96 - 112 (mEq/L)  BUN 15  6 - 23 (mg/dL)   Creatinine, Ser 1.61  0.50 - 1.10 (mg/dL)   Glucose, Bld 096 (*) 70 - 99 (mg/dL)   Calcium, Ion 0.45 (*) 1.12 - 1.32 (mmol/L)   TCO2 30  0 - 100 (mmol/L)   Hemoglobin 15.0  12.0 - 15.0 (g/dL)   HCT 40.9  81.1 - 91.4 (%)   Dg Chest 2 View  11/26/2011  *RADIOLOGY REPORT*  Clinical Data: Shortness of breath  CHEST - 2 VIEW  Comparison: 03/10/2011  Findings: Cardiomediastinal silhouette is stable.  There is no acute infiltrate or pulmonary edema.  There is a nodular mass like consolidation in the right upper lobe measures 3.7 cm.  Although this may be due to focal pneumonia, pulmonary mass cannot be excluded.  Further evaluation with CT scan of the chest with IV contrast is recommended.  IMPRESSION:  There is no acute infiltrate or pulmonary edema.  There is a nodular mass like consolidation in the right upper lobe measures 3.7 cm.  Although this may be due to focal pneumonia, pulmonary mass cannot be excluded.  Further evaluation with CT scan of the chest with IV contrast is recommended.  Original Report Authenticated By: Natasha Mead, M.D.   Ct Chest W Contrast  11/26/2011  *RADIOLOGY REPORT*  Clinical Data: Shortness of breath, cough and fever.  CT CHEST WITH CONTRAST  Technique:  Multidetector CT imaging of the chest was performed following the standard protocol during bolus administration of intravenous contrast.  Contrast: 80mL OMNIPAQUE IOHEXOL 300 MG/ML  SOLN  Comparison: 11/26/2011.  Findings: Heart size is normal.  There is no supraclavicular or axillary adenopathy.  No enlarged mediastinal or hilar adenopathy.  No pericardial or pleural effusion.  Spinal stimulator is identified within the thoracic canal.  No upper lobe  infiltrative process containing air bronchograms and surrounding ground-glass attenuation is identified.  The appearance is most consistent with bronchopneumonia.  The patchy airspace densities are also noted within the superior segment of the right lower lobe and the left posterior lung base.  The tracheobronchial tree appears normal.  Review of the visualized osseous structures is significant for mild multilevel thoracic spondylosis.  T11 vertebral hemangioma is noted, image number 44.  Limited imaging through the upper abdomen shows fatty infiltration of the liver.  IMPRESSION:  1.  Right upper lobe infiltrative process likely due to bronchopneumonia.  Clinical and radiographic followup is recommended to ensure complete resolution. 2.  Mild patchy airspace densities are noted within the superior segment of the right lower lobe and in the left base which are likely due to infection.  Original Report Authenticated By: Rosealee Albee, M.D.      MDM  Patient with fever cough and shortness of breath, unrelieved with Z-Pak, Robitussin, and Mucinex. Chest x-ray shows a nodular masslike consolidation in the right upper lobe measuring 3.7 cm which may be a focal pneumonia. Pulmonary mass cannot be excluded. Therefore a CT scan of chest with contrast IV contrast were recommended. I discussed this with the patient and she agrees to have a CT scan for further evaluation.  I received the finding is more consistent with pneumonia and less likely to be a neoplasm. She has no unexplained weight changes, night sweats, myalgias to suggest that it is cancer related. Currently patient has a normal white count. Her urinalysis was unremarkable. Her INR is 3.24, and reflect a mild supratherapeutic range. Her troponin is unremarkable   2:36 PM Chest Ct confirms evidence of pneumonia.  I have discussed with my attending and we felt pt will benefit from admission for further management of community acquired pneumonia that  fail outpt treatment.     3:16 PM I have consulted with [facility] who agrees to admit pt for community acquired pneumonia.  Hospitalist will see pt in ED.  Pt is recommended to be on a tele bed, to floor 4700.  Pt is currently in NAD.    Fayrene Helper, PA-C 11/26/11 1517

## 2011-11-26 NOTE — ED Notes (Signed)
Pt went to md office on Thursday and was placed on zpak.  Pt sts symptoms are getting worse and fever was up to 101 this am.  Pt reports rattling with breathing.  Talking in complete sentences.  Tightness and sob with exertion

## 2011-11-26 NOTE — ED Notes (Signed)
16 French Foley inserted

## 2011-11-26 NOTE — Progress Notes (Signed)
Pt. Placed on 12.0 CPAP with nasal pillows and no oxygen bled in. Pt. Tolerating well.

## 2011-11-26 NOTE — Progress Notes (Signed)
ANTICOAGULATION CONSULT NOTE - Initial Consult  Pharmacy Consult for coumadin Indication: atrial fibrillation  Allergies  Allergen Reactions  . Chloraprep One Step     Sores at site  . Hydrocodone   . Zoloft     Patient Measurements:   Heparin Dosing Weight:   Vital Signs: Temp: 99.2 F (37.3 C) (04/07 1051) Temp src: Oral (04/07 1051) BP: 125/53 mmHg (04/07 1051) Pulse Rate: 89  (04/07 1051)  Labs:  Basename 11/26/11 1306 11/26/11 1253  HGB 15.0 14.7  HCT 44.0 42.7  PLT -- 193  APTT -- --  LABPROT -- 33.6*  INR -- 3.24*  HEPARINUNFRC -- --  CREATININE 0.90 --  CKTOTAL -- --  CKMB -- --  TROPONINI -- <0.30   CrCl is unknown because there is no height on file for the current visit.  Medical History: Past Medical History  Diagnosis Date  . Multiple sclerosis     has had this may 1989-[facility] reg doc  . Hypertension   . Atrial fibrillation   . Thyroid disease   . HLD (hyperlipidemia)   . Shortness of breath   . Pneumonia   . Sleep apnea   . Hyperthyroidism     Medications:  Scheduled:    . atorvastatin  10 mg Oral q1800  . baclofen  20 mg Oral BID  . bethanechol  25 mg Oral BID  . buPROPion  300 mg Oral Daily  . calcium carbonate  1 tablet Oral BID WC  . cholecalciferol  1,000 Units Oral Daily  . diltiazem  120 mg Oral Daily  . furosemide  40 mg Oral BID  . gabapentin  600 mg Oral TID  . lactulose  10 g Oral BID  . levothyroxine  75 mcg Oral QAC breakfast  . methylPREDNISolone (SOLU-MEDROL) injection  80 mg Intravenous Q12H  . methylPREDNISolone sodium succinate      . moxifloxacin  400 mg Intravenous Once  . moxifloxacin  400 mg Intravenous Once  . mulitivitamin with minerals  1 tablet Oral Daily  . nebivolol  20 mg Oral Daily  . omega-3 acid ethyl esters  1 g Oral Daily  . pantoprazole  40 mg Oral Q1200  . piperacillin-tazobactam  3.375 g Intravenous Q8H  . psyllium  1 packet Oral BID WC  . senna  1 tablet Oral BID  . sodium chloride  3  mL Intravenous Q12H  . Tamsulosin HCl  0.4 mg Oral Daily  . DISCONTD: calcium carbonate  600 mg Oral BID WC  . DISCONTD: Fish Oil  1,200 mg Oral Daily  . DISCONTD: guar gum  1 packet Oral BID WC  . DISCONTD: Vitamin D-3  1 tablet Oral Daily   Infusions:    Assessment: 66 yo female with hx of afib will be continued on coumadin.  PTA coumadin was 5mg  po qday except 2.5mg  po on Mondays. INR today 3.24 Goal of Therapy:  INR 2-3   Plan:  1) No Coumadin tonight. 2) Daily PT/INR  Etter Royall, Tsz-Yin 11/26/2011,5:45 PM

## 2011-11-26 NOTE — Progress Notes (Signed)
PCP:   Provider Not In System   Chief Complaint: Cough/Fever  Thursday was coughing and didn't want to wait, and was given a Zpack and mucinex and roobitussin DM by the Pa at his office.  Friday staretd to feel warm, was 99 degrees (her norm is 97.5 degrees) this am when she woke up she was having trouble breathing with a 100 and 101.0 fever, called Dr. Felipa Eth, who was on call and mentioned that she might need to come to the Ed for work-up.  She endorses nasal congestion, and yellow sputum prodction Thursday and Friday.  Husband had some nasal drainaeg, and grandchildren came into town last weekedn.  Had IV steroid about the Friday and on the 8th, 11th and 12th before easter and had a 6 day prednisone taper, as she had a flare of MS-takes the betaseron shots and has a baclofen pump also placed by 2011.  Also some mild weakness of the right leg which is likely from her multiple sclerosis. She has had more weakness today than usual. 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  Review of Systems:  The patient denies cp, +sob, has more swelling than usual, and didn't take any lasix, no dysuria, some r sided weakness today more so than her usual, has some hoarseness.  Past Medical History: Patient Active Problem List  Diagnoses  . Multiple sclerosis, 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  . Aspiration pneumonia vs Failed outpatient Rx of CAP  . Hypertension  . Atrial fibrillation  . Thyroid disease  . HLD (hyperlipidemia)   history of cardiac cath 07/08/10 with no no obstructive disease History of weakness with MRI showing only changes of MS 04/21/2010  Past surgical history: Past Surgical History  Procedure Date  . Cervical fusion     with correction  . Infusion pump implantation     baclofen infusion in lower abd    Medications: Prior to Admission medications   Medication Sig Start Date End Date Taking?  Authorizing Provider  ALPRAZolam Prudy Feeler) 0.5 MG tablet Take 0.5 mg by mouth 2 (two) times daily as needed. For anxiety.   Yes Historical Provider, MD  atorvastatin (LIPITOR) 10 MG tablet Take 10 mg by mouth daily.   Yes Historical Provider, MD  baclofen (LIORESAL) 20 MG tablet Take 20 mg by mouth 2 (two) times daily.   Yes Historical Provider, MD  bethanechol (URECHOLINE) 25 MG tablet Take 25 mg by mouth 2 (two) times daily.   Yes Historical Provider, MD  buPROPion (WELLBUTRIN XL) 300 MG 24 hr tablet Take 300 mg by mouth daily.   Yes Historical Provider, MD  calcium carbonate (OS-CAL) 600 MG TABS Take 600 mg by mouth 2 (two) times daily with a meal.   Yes Historical Provider, MD  cetirizine (ZYRTEC) 10 MG tablet Take 10 mg by mouth daily.   Yes Historical Provider, MD  Cholecalciferol (VITAMIN D-3) 5000 UNITS TABS Take 1 tablet by mouth daily.   Yes Historical Provider, MD  diltiazem (DILACOR XR) 120 MG 24 hr capsule Take 120 mg by mouth daily.   Yes Historical Provider, MD  furosemide (LASIX) 40 MG tablet Take 40 mg by mouth 2 (two) times daily.   Yes Historical Provider, MD  gabapentin (NEURONTIN) 300 MG capsule Take 600 mg by mouth 3 (three) times daily.   Yes Historical Provider, MD  lactulose (CHRONULAC) 10 GM/15ML solution Take 10 g by mouth  2 (two) times daily.   Yes Historical Provider, MD  levothyroxine (SYNTHROID, LEVOTHROID) 75 MCG tablet Take 75 mcg by mouth daily.   Yes Historical Provider, MD  Multiple Vitamin (MULITIVITAMIN WITH MINERALS) TABS Take 1 tablet by mouth daily.   Yes Historical Provider, MD  nebivolol (BYSTOLIC) 10 MG tablet Take 20 mg by mouth daily.   Yes Historical Provider, MD  Omega-3 Fatty Acids (FISH OIL) 600 MG CAPS Take 1,200 mg by mouth daily.   Yes Historical Provider, MD  omeprazole (PRILOSEC) 20 MG capsule Take 20 mg by mouth 2 (two) times daily.   Yes Historical Provider, MD  Tamsulosin HCl (FLOMAX) 0.4 MG CAPS Take 0.4 mg by mouth daily.   Yes Historical  Provider, MD  trimethoprim (TRIMPEX) 100 MG tablet Take 100 mg by mouth daily.   Yes Historical Provider, MD  warfarin (COUMADIN) 2.5 MG tablet Take 2.5-5 mg by mouth daily. Take 5 MG every day except Mondays; on Mondays, take 2.5 MG   Yes Historical Provider, MD  Wheat Dextrin (BENEFIBER PO) Take 1 packet by mouth 2 (two) times daily.   Yes Historical Provider, MD    Allergies:   Allergies  Allergen Reactions  . Chloraprep One Step     Sores at site  . Hydrocodone   . Zoloft     Social History:  reports that she has quit smoking. She does not have any smokeless tobacco history on file. She reports that she does not drink alcohol or use illicit drugs.  Family History: No family history on file.  Physical Exam: Filed Vitals:   11/26/11 1051  BP: 125/53  Pulse: 89  Temp: 99.2 F (37.3 C)  TempSrc: Oral  Resp: 20  SpO2: 95%    HEENT-blood shot eyes, Cushingoid appearance, Morbidly obese, post-op changes to the R eye CHEST-clinically clear, no tvr/no tvf CARDS-s1 s2, no m/r/g ABD-soft,nt,nd, no rebound or garuding SKIN-swollen LE's, good LE pulses NEURO-alert, oriented x3 speech: normal in context and clarity memory: intact grossly cranial nerves II-XII: intact no involuntary movements or tremors sensation: intact to vibration, pain, and light touch cerebellar: finger-to-nose and heel-to-shin intact gait: normal reflexes: full and symmetric plantar responses: downgoing bilaterally decreased LLE and hip flexore strength   Labs on Admission:   Basename 11/26/11 1306  NA 138  K 4.0  CL 99  CO2 --  GLUCOSE 116*  BUN 15  CREATININE 0.90  CALCIUM --  MG --  PHOS --   No results found for this basename: AST:2,ALT:2,ALKPHOS:2,BILITOT:2,PROT:2,ALBUMIN:2 in the last 72 hours No results found for this basename: LIPASE:2,AMYLASE:2 in the last 72 hours  Basename 11/26/11 1306 11/26/11 1253  WBC -- 6.3  NEUTROABS -- 4.2  HGB 15.0 14.7  HCT 44.0 42.7  MCV --  90.7  PLT -- 193    Basename 11/26/11 1253  CKTOTAL --  CKMB --  CKMBINDEX --  TROPONINI <0.30   No results found for this basename: TSH,T4TOTAL,FREET3,T3FREE,THYROIDAB in the last 72 hours No results found for this basename: VITAMINB12:2,FOLATE:2,FERRITIN:2,TIBC:2,IRON:2,RETICCTPCT:2 in the last 72 hours  Radiological Exams on Admission: Dg Chest 2 View  11/26/2011  *RADIOLOGY REPORT*  Clinical Data: Shortness of breath  CHEST - 2 VIEW  Comparison: 03/10/2011  Findings: Cardiomediastinal silhouette is stable.  There is no acute infiltrate or pulmonary edema.  There is a nodular mass like consolidation in the right upper lobe measures 3.7 cm.  Although this may be due to focal pneumonia, pulmonary mass cannot be excluded.  Further evaluation  with CT scan of the chest with IV contrast is recommended.  IMPRESSION:  There is no acute infiltrate or pulmonary edema.  There is a nodular mass like consolidation in the right upper lobe measures 3.7 cm.  Although this may be due to focal pneumonia, pulmonary mass cannot be excluded.  Further evaluation with CT scan of the chest with IV contrast is recommended.  Original Report Authenticated By: Natasha Mead, M.D.   Ct Chest W Contrast  11/26/2011  *RADIOLOGY REPORT*  Clinical Data: Shortness of breath, cough and fever.  CT CHEST WITH CONTRAST  Technique:  Multidetector CT imaging of the chest was performed following the standard protocol during bolus administration of intravenous contrast.  Contrast: 80mL OMNIPAQUE IOHEXOL 300 MG/ML  SOLN  Comparison: 11/26/2011.  Findings: Heart size is normal.  There is no supraclavicular or axillary adenopathy.  No enlarged mediastinal or hilar adenopathy.  No pericardial or pleural effusion.  Spinal stimulator is identified within the thoracic canal.  No upper lobe infiltrative process containing air bronchograms and surrounding ground-glass attenuation is identified.  The appearance is most consistent with bronchopneumonia.   The patchy airspace densities are also noted within the superior segment of the right lower lobe and the left posterior lung base.  The tracheobronchial tree appears normal.  Review of the visualized osseous structures is significant for mild multilevel thoracic spondylosis.  T11 vertebral hemangioma is noted, image number 44.  Limited imaging through the upper abdomen shows fatty infiltration of the liver.  IMPRESSION:  1.  Right upper lobe infiltrative process likely due to bronchopneumonia.  Clinical and radiographic followup is recommended to ensure complete resolution. 2.  Mild patchy airspace densities are noted within the superior segment of the right lower lobe and in the left base which are likely due to infection.  Original Report Authenticated By: Rosealee Albee, M.D.    Assessment/Plan Patient Active Problem List  Diagnoses  . Multiple sclerosis, 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-Will give [name] dose Solu-Medrol 80 mg IV every 12 hourly for 5 days and she'll continue her usual scheduled home medications for this. If patient seems to decompensate or develops a true flare of multiple sclerosis I will consult her neurologist Dr. Marylou Flesher  . Aspiration pneumonia vs Failed outpatient Rx of CAP-WIll cover her with Aspiration Pneumonia coverage and de-escalate her abx early if proves to get better.  She might have a lipoid Pneumonia but this would be considered more rare.  She is also relatively immunocompromised, given her MS state, and has had recent exposure to steroids as per HPI. There is some evidence that providing Stress dose steroid through a pneumonia actually is beneficial, and as such I have ordered Solu-medrol 80 mg q12 for 5 days after which she might benefit from a lower tapering dose of oral prednisone after that course is completed  . Hypertension-moderately well controlled. Continue diltiazem 120 mg XL, Nevibolol 10 mg daily,   . Atrial fibrillation-well controlled.  Continue Bystolic 10 mg daily. INR is supratherapeutic and we'll have pharmacy dose. This could be secondary to azithromycin which can cause this  . Thyroid disease-75 mcg daily continue levothyroxine 75 mcg daily.  Would check TSH only after 6 weeks of not being on IV steroids as this can suppress the HPA axis     obstructive sleep apnea-continue BiPAP at home settings 11 cm of water nightly     morbid obesity-needs outpatient followup for this     paired glucose tolerance-likely to happen on  steroids. Has used IV insulin in the past for this. We will get a.m. CBGs and monitor and manage if above 200 to     psych-continue alprazolam 0.5 mg 2 times daily, continue bupropion 300 mg XL     constipation-we'll continue lactulose. Occasionally does need water + soap enema. We will hold that she had a stool yesterday     urinary incontinence-likely secondary to her MS. We will hold on placing Foley catheter, patient is capable of getting up to restroom along the past she has used a foley She seems to be on suppressive therapy for urinary tract infections in May discontinued her Bactrim for as she will be on Zosyn   . HLD (hyperlipidemia)-continue Lipitor 10 mg daily      >40 minutes Team 8 admit Partial code  [facility] 11/26/2011, 3:14 PM

## 2011-11-27 LAB — COMPREHENSIVE METABOLIC PANEL
ALT: 25 U/L (ref 0–35)
CO2: 28 mEq/L (ref 19–32)
Calcium: 8.8 mg/dL (ref 8.4–10.5)
Creatinine, Ser: 0.63 mg/dL (ref 0.50–1.10)
GFR calc Af Amer: >90 mL/min (ref >90)
GFR calc non Af Amer: >90 mL/min (ref >90)
Glucose, Bld: 141 mg/dL — ABNORMAL HIGH (ref 70–99)
Sodium: 138 mEq/L (ref 135–145)
Total Protein: 6.2 g/dL (ref 6.0–8.3)

## 2011-11-27 LAB — CBC
HCT: 42.9 % (ref 36.0–46.0)
Hemoglobin: 14.3 g/dL (ref 12.0–15.0)
MCHC: 33.3 g/dL (ref 30.0–36.0)
RBC: 4.72 MIL/uL (ref 3.87–5.11)

## 2011-11-27 LAB — GLUCOSE, CAPILLARY: Glucose-Capillary: 145 mg/dL — ABNORMAL HIGH (ref 70–99)

## 2011-11-27 LAB — PROTIME-INR
INR: 2.63 — ABNORMAL HIGH (ref 0.00–1.49)
Prothrombin Time: 28.5 seconds — ABNORMAL HIGH (ref 11.6–15.2)

## 2011-11-27 MED ORDER — ALBUTEROL SULFATE (5 MG/ML) 0.5% IN NEBU
2.5000 mg | INHALATION_SOLUTION | RESPIRATORY_TRACT | Status: DC | PRN
  Administered 2011-11-28 – 2011-11-30 (×7): 2.5 mg via RESPIRATORY_TRACT
  Filled 2011-11-27 (×7): qty 0.5

## 2011-11-27 MED ORDER — POTASSIUM CHLORIDE CRYS ER 20 MEQ PO TBCR
EXTENDED_RELEASE_TABLET | ORAL | Status: AC
  Administered 2011-11-27: 23:00:00
  Filled 2011-11-27: qty 1

## 2011-11-27 MED ORDER — IPRATROPIUM BROMIDE 0.02 % IN SOLN: 0.5000 mg | Freq: Four times a day (QID) | RESPIRATORY_TRACT | Status: DC

## 2011-11-27 MED ORDER — PIPERACILLIN-TAZOBACTAM 3.375 G IVPB
3.3750 g | Freq: Three times a day (TID) | INTRAVENOUS | Status: DC
  Administered 2011-11-27 – 2011-11-28 (×3): 3.375 g via INTRAVENOUS
  Filled 2011-11-27 (×6): qty 50

## 2011-11-27 MED ORDER — WARFARIN SODIUM 2.5 MG PO TABS
2.5000 mg | ORAL_TABLET | Freq: Once | ORAL | Status: AC
  Administered 2011-11-27: 2.5 mg via ORAL
  Filled 2011-11-27 (×2): qty 1

## 2011-11-27 MED ORDER — POTASSIUM CHLORIDE CRYS ER 20 MEQ PO TBCR
20.0000 meq | EXTENDED_RELEASE_TABLET | Freq: Once | ORAL | Status: AC
  Administered 2011-11-27: 20 meq via ORAL

## 2011-11-27 MED ORDER — INSULIN ASPART 100 UNIT/ML ~~LOC~~ SOLN
0.0000 [IU] | Freq: Three times a day (TID) | SUBCUTANEOUS | Status: DC
  Administered 2011-11-27 – 2011-11-28 (×2): 3 [IU] via SUBCUTANEOUS
  Administered 2011-11-28 – 2011-11-29 (×2): 4 [IU] via SUBCUTANEOUS
  Administered 2011-11-29: 3 [IU] via SUBCUTANEOUS
  Administered 2011-11-30 (×2): 4 [IU] via SUBCUTANEOUS

## 2011-11-27 MED ORDER — ACETAMINOPHEN 325 MG PO TABS
650.0000 mg | ORAL_TABLET | Freq: Four times a day (QID) | ORAL | Status: DC | PRN
  Administered 2011-11-27 – 2011-11-28 (×2): 650 mg via ORAL
  Filled 2011-11-27: qty 2
  Filled 2011-11-27: qty 1

## 2011-11-27 NOTE — Progress Notes (Signed)
INITIAL ADULT NUTRITION ASSESSMENT Date: 11/27/2011   Time: 2:44 PM Reason for Assessment: nutrition risk, dysphagia  ASSESSMENT: Female 66 y.o.  Dx: Aspiration pneumonia  Hx:  Past Medical History  Diagnosis Date  . Multiple sclerosis     has had this may 1989-[facility] reg doc  . Hypertension   . Atrial fibrillation   . Thyroid disease   . HLD (hyperlipidemia)   . Shortness of breath   . Pneumonia   . Sleep apnea   . Hyperthyroidism     Related Meds:     . atorvastatin  10 mg Oral q1800  . baclofen  20 mg Oral BID  . bethanechol  25 mg Oral BID  . buPROPion  300 mg Oral Daily  . calcium carbonate  1 tablet Oral BID WC  . cholecalciferol  1,000 Units Oral Daily  . diltiazem  120 mg Oral Daily  . furosemide  40 mg Oral BID  . gabapentin  300 mg Oral TID  . insulin aspart  0-20 Units Subcutaneous TID WC  . lactulose  10 g Oral BID  . levothyroxine  75 mcg Oral QAC breakfast  . methylPREDNISolone (SOLU-MEDROL) injection  80 mg Intravenous Q12H  . methylPREDNISolone sodium succinate      . moxifloxacin  400 mg Intravenous Once  . mulitivitamin with minerals  1 tablet Oral Daily  . nebivolol  20 mg Oral Daily  . omega-3 acid ethyl esters  1 g Oral Daily  . pantoprazole  40 mg Oral Q1200  . piperacillin-tazobactam (ZOSYN)  IV  3.375 g Intravenous Q8H  . psyllium  1 packet Oral BID WC  . senna  1 tablet Oral BID  . sodium chloride  3 mL Intravenous Q12H  . Tamsulosin HCl  0.4 mg Oral Daily  . warfarin  2.5 mg Oral ONCE-1800  . Warfarin - Pharmacist Dosing Inpatient   Does not apply q1800  . DISCONTD: calcium carbonate  600 mg Oral BID WC  . DISCONTD: Fish Oil  1,200 mg Oral Daily  . DISCONTD: gabapentin  600 mg Oral TID  . DISCONTD: guar gum  1 packet Oral BID WC  . DISCONTD: piperacillin-tazobactam  3.375 g Intravenous Q8H  . DISCONTD: Vitamin D-3  1 tablet Oral Daily     Ht: 5\' 4"  (162.6 cm)  Wt: 274 lb 14.6 oz (124.7 kg) (Scale B.)  Ideal Wt: 54.5 kg %  Ideal Wt: 228%  Usual Wt: ~270 lbs % Usual Wt: ~100%  Body mass index is 47.19 kg/(m^2). Morbid Obesity  Food/Nutrition Related Hx: Pt states that her weight is stable, fluctuates a little from her Lasix. No change in appetite. Some difficulty with swallowing.  Labs:  CMP     Component Value Date/Time   NA 138 11/27/2011 0510   K 3.3* 11/27/2011 0510   CL 99 11/27/2011 0510   CO2 28 11/27/2011 0510   GLUCOSE 141* 11/27/2011 0510   BUN 14 11/27/2011 0510   CREATININE 0.63 11/27/2011 0510   CALCIUM 8.8 11/27/2011 0510   PROT 6.2 11/27/2011 0510   ALBUMIN 3.2* 11/27/2011 0510   AST 17 11/27/2011 0510   ALT 25 11/27/2011 0510   ALKPHOS 66 11/27/2011 0510   BILITOT 0.2* 11/27/2011 0510   GFRNONAA >90 11/27/2011 0510   GFRAA >90 11/27/2011 0510    Intake/Output Summary (Last 24 hours) at 11/27/11 1447 Last data filed at 11/27/11 1421  Gross per 24 hour  Intake   1123 ml  Output   4100  ml  Net  -2977 ml     Diet Order: Carb Control  Supplements/Tube Feeding: none  IVF:    Estimated Nutritional Needs:   Kcal:  1700-1900  Protein: 80-90 gm Fluid:  1.7 - 1.9 L  Pt seen by SLP for bed side swallow evaluation, states symptoms are likely esophageal based dysphagia. Pt states that she doesn't make any dietary changes at home other then take medication for this. SLP recommended esophageal assessment, regular consistency diet with thin liquids. Pt denied needs.   NUTRITION DIAGNOSIS: -Swallowing difficulty (NI-1.1).  Status: Ongoing  RELATED TO: esophageal dysphagia  AS EVIDENCE BY: SLP notes  MONITORING/EVALUATION(Goals): Goal: Pt will meet >90% of estimated nutrition needs  Monitor: PO intake, weight, labs, I/O's  EDUCATION NEEDS: -No education needs identified at this time  INTERVENTION: 1. No nutrition intervention at this time 2. RD will continue to follow  Dietitian [phone]  DOCUMENTATION CODES Per approved criteria  -Morbid Obesity    Toni [name] 11/27/2011, 2:44 PM

## 2011-11-27 NOTE — Progress Notes (Signed)
ANTICOAGULATION CONSULT NOTE - Follow Up Consult  Pharmacy Consult for coumadin Indication: atrial fibrillation  Allergies  Allergen Reactions  . Chloraprep One Step     Sores at site  . Hydrocodone   . Zoloft     Patient Measurements: Height: 5\' 4"  (162.6 cm) Weight: 274 lb 14.6 oz (124.7 kg) (Scale B.) IBW/kg (Calculated) : 54.7    Vital Signs: Temp: 99 F (37.2 C) (04/08 0536) Temp src: Oral (04/08 0536) BP: 154/77 mmHg (04/08 0536) Pulse Rate: 94  (04/08 0536)  Labs:  Basename 11/27/11 0510 11/26/11 1306 11/26/11 1253  HGB 14.3 15.0 --  HCT 42.9 44.0 42.7  PLT 210 -- 193  APTT -- -- --  LABPROT 28.5* -- 33.6*  INR 2.63* -- 3.24*  HEPARINUNFRC -- -- --  CREATININE 0.63 0.90 --  CKTOTAL -- -- --  CKMB -- -- --  TROPONINI -- -- <0.30   Estimated Creatinine Clearance: 92.8 ml/min (by C-G formula based on Cr of 0.63).  Medical History: Past Medical History  Diagnosis Date  . Multiple sclerosis     has had this may 1989-[facility] reg doc  . Hypertension   . Atrial fibrillation   . Thyroid disease   . HLD (hyperlipidemia)   . Shortness of breath   . Pneumonia   . Sleep apnea   . Hyperthyroidism     Medications:  Scheduled:     . atorvastatin  10 mg Oral q1800  . baclofen  20 mg Oral BID  . bethanechol  25 mg Oral BID  . buPROPion  300 mg Oral Daily  . calcium carbonate  1 tablet Oral BID WC  . cholecalciferol  1,000 Units Oral Daily  . diltiazem  120 mg Oral Daily  . furosemide  40 mg Oral BID  . gabapentin  300 mg Oral TID  . lactulose  10 g Oral BID  . levothyroxine  75 mcg Oral QAC breakfast  . methylPREDNISolone (SOLU-MEDROL) injection  80 mg Intravenous Q12H  . methylPREDNISolone sodium succinate      . moxifloxacin  400 mg Intravenous Once  . moxifloxacin  400 mg Intravenous Once  . mulitivitamin with minerals  1 tablet Oral Daily  . nebivolol  20 mg Oral Daily  . omega-3 acid ethyl esters  1 g Oral Daily  . pantoprazole  40 mg Oral  Q1200  . piperacillin-tazobactam  3.375 g Intravenous Q8H  . psyllium  1 packet Oral BID WC  . senna  1 tablet Oral BID  . sodium chloride  3 mL Intravenous Q12H  . Tamsulosin HCl  0.4 mg Oral Daily  . Warfarin - Pharmacist Dosing Inpatient   Does not apply q1800  . DISCONTD: calcium carbonate  600 mg Oral BID WC  . DISCONTD: Fish Oil  1,200 mg Oral Daily  . DISCONTD: gabapentin  600 mg Oral TID  . DISCONTD: guar gum  1 packet Oral BID WC  . DISCONTD: Vitamin D-3  1 tablet Oral Daily   Infusions:    Assessment: 66 yo female with hx of afib will be continued on coumadin.  PTA coumadin was 5mg  po qday except 2.5mg  po on Mondays. INR today is within desired goal range at 2.63.  She has no noted bleeding complications.  Patient with MS on chronic steroids, admitted with shortness of breath and fever with productive sputum.  Chest xray = rt. Upper lobe infiltrate - likely pneumonia.   She is receiving IV Zosyn for aspiration pneumonia.  Today,  temp is 99, no leukocytosis, no culture data. Will need to determine length of therapy for her.  Goal of Therapy:  INR 2-3   Plan:  1) Warfarin 2.5mg  PO x1 today  2) Daily PT/INR   Nadara Mustard, PharmD., MS Clinical Pharmacist Pager:  [phone] 11/27/2011,8:22 AM

## 2011-11-27 NOTE — Evaluation (Signed)
Occupational Therapy Evaluation Patient Details Name: Toni [name] MRN: [id number] DOB: 03-28-1946 Today's Date: 11/27/2011  Problem List:  Patient Active Problem List  Diagnoses  . Multiple sclerosis, 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  . Aspiration pneumonia vs Failed outpatient Rx of CAP  . Hypertension  . Atrial fibrillation  . Thyroid disease  . HLD (hyperlipidemia)    Past Medical History:  Past Medical History  Diagnosis Date  . Multiple sclerosis     has had this may 1989-[facility] reg doc  . Hypertension   . Atrial fibrillation   . Thyroid disease   . HLD (hyperlipidemia)   . Shortness of breath   . Pneumonia   . Sleep apnea   . Hyperthyroidism    Past Surgical History:  Past Surgical History  Procedure Date  . Cervical fusion     with correction-june 2005  . Infusion pump implantation     baclofen infusion in lower abd  . Vein bypass surgery     radiofreq ablation 1 yr ago-Dr Lynnea Ferrier and [place]  . Back surgery     OT Assessment/Plan/Recommendation OT Assessment Clinical Impression Statement: Pt. presents with CAP and with history of MS and below problem list. Pt. will benefit from skilled OT to increase functional independence with ADLs and get pt. to supervision level at D/C home. OT Recommendation/Assessment: Patient will need skilled OT in the acute care venue OT Problem List: Decreased activity tolerance;Impaired balance (sitting and/or standing);Decreased knowledge of use of DME or AE;Decreased knowledge of precautions;Cardiopulmonary status limiting activity;Decreased strength Barriers to Discharge: None OT Therapy Diagnosis : Generalized weakness OT Plan OT Frequency: Min 2X/week OT Treatment/Interventions: Self-care/ADL training;DME and/or AE instruction;Therapeutic activities;Patient/family education;Balance training;Energy conservation OT Recommendation Follow Up Recommendations: Home health OT Equipment Recommended: None  recommended by OT Individuals Consulted Consulted and Agree with Results and Recommendations: Patient OT Goals Acute Rehab OT Goals OT Goal Formulation: With patient Time For Goal Achievement: 7 days ADL Goals Pt Will Perform Grooming: with set-up;with supervision;Standing at sink ADL Goal: Grooming - Progress: Goal set today Pt Will Perform Lower Body Bathing: with set-up;with supervision;Sit to stand from chair;with adaptive equipment ADL Goal: Lower Body Bathing - Progress: Goal set today Pt Will Perform Lower Body Dressing: with set-up;with supervision;Sit to stand from chair;with adaptive equipment ADL Goal: Lower Body Dressing - Progress: Goal set today Pt Will Transfer to Toilet: Ambulation;with DME;with modified independence;3-in-1 ADL Goal: Toilet Transfer - Progress: Goal set today Additional ADL Goal #1: Pt. will recall 3 energy conservation techniques to use with ADLs ADL Goal: Additional Goal #1 - Progress: Goal set today  OT Evaluation Precautions/Restrictions  Precautions Precautions: Fall Restrictions Weight Bearing Restrictions: No Prior Functioning Home Living Lives With: Spouse Receives Help From: Family Type of Home: House Home Layout: Two level;Able to live on main level with bedroom/bathroom Home Access: Stairs to enter Entrance Stairs-Rails: Left;Right;Can reach both Entrance Stairs-Number of Steps: 3 Bathroom Shower/Tub: Walk-in shower;Door Bathroom Toilet: Handicapped height Bathroom Accessibility: Yes How Accessible: Accessible via wheelchair;Accessible via walker Home Adaptive Equipment: Grab bars around toilet;Grab bars in shower;Built-in shower seat;Walker - rolling;Wheelchair - manual Prior Function Level of Independence: Independent with basic ADLs;Independent with transfers;Independent with gait;Requires assistive device for independence Able to Take Stairs?: Yes Driving: No Vocation: On disability Leisure: Hobbies-no Comments: Pt. limited  by decreased energy at home affecting functions with ADLs. ADL ADL Eating/Feeding: Simulated;Set up Where Assessed - Eating/Feeding: Chair Grooming: Wash/dry face;Set up;Performed Where Assessed - Grooming: Sitting, chair Upper Body Bathing:  Simulated;Chest;Right arm;Left arm;Abdomen;Minimal assistance Where Assessed - Upper Body Bathing: Sitting, chair Lower Body Bathing: Simulated;+1 Total assistance Where Assessed - Lower Body Bathing: Sit to stand from chair Upper Body Dressing: Simulated;Minimal assistance Where Assessed - Upper Body Dressing: Sitting, chair Lower Body Dressing: Performed;Moderate assistance Lower Body Dressing Details (indicate cue type and reason): don/doff socks with use of reacher/sock aid to complete Where Assessed - Lower Body Dressing: Sitting, chair Toilet Transfer: Simulated;Minimal assistance Toilet Transfer Equipment: Other (comment) (recliner) Toileting - Clothing Manipulation: Simulated;Minimal assistance Where Assessed - Toileting Clothing Manipulation: Sit to stand from 3-in-1 or toilet Toileting - Hygiene: Simulated;Minimal assistance Where Assessed - Toileting Hygiene: Sit to stand from 3-in-1 or toilet    Cognition Cognition Arousal/Alertness: Awake/alert Overall Cognitive Status: Appears within functional limits for tasks assessed Sensation/Coordination Coordination Gross Motor Movements are Fluid and Coordinated: Yes Fine Motor Movements are Fluid and Coordinated: No Extremity Assessment RUE Assessment RUE Assessment: Within Functional Limits LUE Assessment LUE Assessment: Within Functional Limits Mobility  Bed Mobility Bed Mobility: No Supine to Sit: 6: Modified independent (Device/Increase time);HOB elevated (Comment degrees) (HOB 35 degrees) Transfers Sit to Stand: 4: Min assist;With armrests;From chair/3-in-1 Sit to Stand Details (indicate cue type and reason): Due to low surface  Stand to Sit: 5: Supervision;To  chair/3-in-1;With armrests;With upper extremity assist Stand to Sit Details: Verbal cues for safe technique.     End of Session OT - End of Session Activity Tolerance: Patient limited by fatigue Patient left: in chair;with call bell in reach Nurse Communication: Mobility status for transfers General Behavior During Session: [facility] for tasks performed Cognition: [facility] for tasks performed   Dickson Kostelnik, OTR/L Pager [phone] 11/27/2011, 1:05 PM

## 2011-11-27 NOTE — Evaluation (Signed)
Physical Therapy Evaluation Patient Details Name: Toni [name] MRN: [id number] DOB: 09/03/1945 Today's Date: 11/27/2011  Problem List:  Patient Active Problem List  Diagnoses  . Multiple sclerosis, 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  . Aspiration pneumonia vs Failed outpatient Rx of CAP  . Hypertension  . Atrial fibrillation  . Thyroid disease  . HLD (hyperlipidemia)    Past Medical History:  Past Medical History  Diagnosis Date  . Multiple sclerosis     has had this may 1989-[facility] reg doc  . Hypertension   . Atrial fibrillation   . Thyroid disease   . HLD (hyperlipidemia)   . Shortness of breath   . Pneumonia   . Sleep apnea   . Hyperthyroidism    Past Surgical History:  Past Surgical History  Procedure Date  . Cervical fusion     with correction-june 2005  . Infusion pump implantation     baclofen infusion in lower abd  . Vein bypass surgery     radiofreq ablation 1 yr ago-Dr Lynnea Ferrier and [place]  . Back surgery     PT Assessment/Plan/Recommendation PT Assessment Clinical Impression Statement: Pt is a 66 y/o female with history of MS admitted for complications related to pneumonia.  Pt will benefit from Acute PT intervention to maximize independence and safety with mobility.   Rt side weakness appears to be pt primary limiting factor.   PT Recommendation/Assessment: Patient will need skilled PT in the acute care venue PT Problem List: Decreased strength;Decreased activity tolerance;Obesity Barriers to Discharge: None PT Therapy Diagnosis : Generalized weakness PT Plan PT Frequency: Min 3X/week PT Treatment/Interventions: Gait training;Functional mobility training;Therapeutic activities;Balance training;Therapeutic exercise;Neuromuscular re-education;Patient/family education;DME instruction;Stair training PT Recommendation Follow Up Recommendations: Other (comment) (To be determined) Equipment Recommended: None recommended by PT PT Goals  Acute  Rehab PT Goals PT Goal Formulation: With patient Time For Goal Achievement: 7 days Pt will Stand: with modified independence;6 - 10 min;with no upper extremity support PT Goal: Stand - Progress: Goal set today Pt will Ambulate: 51 - 150 feet;with modified independence;with least restrictive assistive device PT Goal: Ambulate - Progress: Goal set today Pt will Go Up / Down Stairs: 3-5 stairs;Independently;with rail(s) PT Goal: Up/Down Stairs - Progress: Goal set today Pt will Perform Home Exercise Program: Independently PT Goal: Perform Home Exercise Program - Progress: Goal set today  PT Evaluation Precautions/Restrictions  Precautions Precautions: Fall Prior Functioning  Home Living Lives With: Spouse Receives Help From: Family Type of Home: House Home Layout: Two level;Able to live on main level with bedroom/bathroom Home Access: Stairs to enter Entrance Stairs-Rails: Left;Right;Can reach both Entrance Stairs-Number of Steps: 3 Bathroom Shower/Tub: Walk-in shower;Door Bathroom Toilet: Handicapped height Bathroom Accessibility: Yes How Accessible: Accessible via wheelchair;Accessible via walker Home Adaptive Equipment: Grab bars around toilet;Grab bars in shower;Built-in shower seat;Walker - rolling;Wheelchair - manual Prior Function Level of Independence: Independent with basic ADLs;Independent with transfers;Independent with gait;Requires assistive device for independence (RW for ambulation. ) Able to Take Stairs?: Yes Driving: No Vocation: On disability Leisure: Hobbies-no Cognition Cognition Arousal/Alertness: Awake/alert Overall Cognitive Status: Appears within functional limits for tasks assessed Sensation/Coordination Coordination Gross Motor Movements are Fluid and Coordinated: Yes Fine Motor Movements are Fluid and Coordinated: No Extremity Assessment RUE Assessment RUE Assessment: Not tested LUE Assessment LUE Assessment: Not tested RLE Assessment RLE  Assessment: Exceptions to [facility] RLE Strength RLE Overall Strength: Deficits RLE Overall Strength Comments: 3-/5 gross LE strength.  LLE Assessment LLE Assessment: Within Functional Limits Mobility (including Balance) Bed  Mobility Bed Mobility: Yes Supine to Sit: 6: Modified independent (Device/Increase time);HOB elevated (Comment degrees) (HOB 35 degrees) Transfers Transfers: Yes Sit to Stand: 5: Supervision;With upper extremity assist;From bed;From chair/3-in-1 Sit to Stand Details (indicate cue type and reason): Supervision for safety, no assistance required.  Stand to Sit: 5: Supervision;To chair/3-in-1;With armrests;With upper extremity assist Stand to Sit Details: Verbal cues for safe technique.   Ambulation/Gait Ambulation/Gait: Yes Ambulation/Gait Assistance: 5: Supervision Ambulation/Gait Assistance Details (indicate cue type and reason): Supervision for safety due to pt c/o weakness in Rt LE.  Verbal cues for upright trunk posture. No buckling or loss of motor control noted in Rt LE.   Ambulation Distance (Feet): 30 Feet Assistive device: Rolling walker Gait Pattern: Step-through pattern;Trunk flexed Stairs: No Wheelchair Mobility Wheelchair Mobility: No  Posture/Postural Control Posture/Postural Control: No significant limitations Balance Balance Assessed: No Exercise  Total Joint Exercises Long Arc Quad: Strengthening;Right;10 reps;Seated End of Session PT - End of Session Equipment Utilized During Treatment: Gait belt Activity Tolerance: Patient tolerated treatment well Patient left: Other (comment);with call bell in reach (In bathroom with call cord in hand ) Nurse Communication: Mobility status for transfers;Mobility status for ambulation;Other (comment) (Notified RN pt in bathroom. ) General Behavior During Session: [facility] for tasks performed Cognition: [facility] for tasks performed  Amiah Frohlich 11/27/2011, 11:57 AM Sumiko Ceasar L. Takeyah Wieman DPT [phone]

## 2011-11-27 NOTE — Progress Notes (Signed)
RT spoke with pt about wearing CPAP. Pt states she can place it when she's ready. RT advised pt to call if she had any issues. RT will continue to monitor.

## 2011-11-27 NOTE — Progress Notes (Signed)
PROGRESS NOTE  Toni [name] AVW:[id number] DOB: 10-Dec-1945 DOA: 11/26/2011 PCP: Provider Not In System  Brief narrative: 66 year old female with progressive multiple sclerosis admitted with failed outpatient pneumonia treatment  Past medical history: Multiple sclerosis, primary chronic progressive diagnosed 1989 Aspiration pneumonia? Hypertension, Rate control atrial fibrillation on Coumadin Thyroid disease, Hyperlipidemia, History cardiac cath 07/08/10 with no obstructive changes  Consultants:  None  Procedures:  Chest x-ray 4/7 = no acute infiltrate or pulmonary edema, there is a nodular masslike consolidation right upper lobe measuring 3.7 cm. Although this may be focal pneumonia, mass cannot be excluded and recommended CT with IV contrast   CT chest 4/70) right upper lobe infiltrative process likely due to bronchopneumonia, mild patchy airspace densities within superior segment right lower lobe and left base which are likely due to infection  Antibiotics:  Moxifloxacin 4/7  Zosyn 4/7-   Subjective  Feels depressed. Feels a little warmer than usual. No specific chest pain but does state she has  Increasing hoarseness. Not really able to bring a much sputum. No nausea no vomiting no diarrhea no chest pain still feels weaker than usual and has Some low-grade temps   Objective   Interim History: Speech therapy input noted-needs full evaluations still   Objective: Filed Vitals:   11/27/11 0212 11/27/11 0536 11/27/11 1047 11/27/11 1350  BP: 148/73 154/77 148/82 133/58  Pulse: 96 94  91  Temp: 99.1 F (37.3 C) 99 F (37.2 C)  98.2 F (36.8 C)  TempSrc: Oral Oral  Oral  Resp: 18 20  20   Height:      Weight:  124.7 kg (274 lb 14.6 oz)    SpO2: 96% 94%  95%    Intake/Output Summary (Last 24 hours) at 11/27/11 1557 Last data filed at 11/27/11 1421  Gross per 24 hour  Intake   1123 ml  Output   4100 ml  Net  -2977 ml    Exam:  HEENT-blood shot eyes,  Cushingoid appearance, Morbidly obese, post-op changes to the R eye  CHEST-clinically clear, no tvr/no tvf  CARDS-s1 s2, no m/r/g  ABD-soft,nt,nd, no rebound or garuding  SKIN-swollen LE's, good LE pulses  NEURO-alert, oriented x3  speech: normal in context and clarity  memory: intact grossly  cranial nerves II-XII: intact  no involuntary movements or tremors  At baseline   Data Reviewed: Basic Metabolic Panel:  Lab 11/27/11 8295 11/26/11 1306  NA 138 138  K 3.3* 4.0  CL 99 99  CO2 28 --  GLUCOSE 141* 116*  BUN 14 15  CREATININE 0.63 0.90  CALCIUM 8.8 --  MG -- --  PHOS -- --   Liver Function Tests:  Lab 11/27/11 0510  AST 17  ALT 25  ALKPHOS 66  BILITOT 0.2*  PROT 6.2  ALBUMIN 3.2*   No results found for this basename: LIPASE:5,AMYLASE:5 in the last 168 hours No results found for this basename: AMMONIA:5 in the last 168 hours CBC:  Lab 11/27/11 0510 11/26/11 1306 11/26/11 1253  WBC 3.8* -- 6.3  NEUTROABS -- -- 4.2  HGB 14.3 15.0 14.7  HCT 42.9 44.0 42.7  MCV 90.9 -- 90.7  PLT 210 -- 193   Cardiac Enzymes:  Lab 11/26/11 1253  CKTOTAL --  CKMB --  CKMBINDEX --  TROPONINI <0.30   BNP: No components found with this basename: POCBNP:5 CBG: No results found for this basename: GLUCAP:5 in the last 168 hours  No results found for this or any previous visit (from the  past 240 hour(s)).   Studies:              All Imaging reviewed and is as per above notation   Scheduled Meds:   . atorvastatin  10 mg Oral q1800  . baclofen  20 mg Oral BID  . bethanechol  25 mg Oral BID  . buPROPion  300 mg Oral Daily  . calcium carbonate  1 tablet Oral BID WC  . cholecalciferol  1,000 Units Oral Daily  . diltiazem  120 mg Oral Daily  . furosemide  40 mg Oral BID  . gabapentin  300 mg Oral TID  . insulin aspart  0-20 Units Subcutaneous TID WC  . lactulose  10 g Oral BID  . levothyroxine  75 mcg Oral QAC breakfast  . methylPREDNISolone (SOLU-MEDROL) injection  80  mg Intravenous Q12H  . methylPREDNISolone sodium succinate      . moxifloxacin  400 mg Intravenous Once  . mulitivitamin with minerals  1 tablet Oral Daily  . nebivolol  20 mg Oral Daily  . omega-3 acid ethyl esters  1 g Oral Daily  . pantoprazole  40 mg Oral Q1200  . piperacillin-tazobactam (ZOSYN)  IV  3.375 g Intravenous Q8H  . psyllium  1 packet Oral BID WC  . senna  1 tablet Oral BID  . sodium chloride  3 mL Intravenous Q12H  . Tamsulosin HCl  0.4 mg Oral Daily  . warfarin  2.5 mg Oral ONCE-1800  . Warfarin - Pharmacist Dosing Inpatient   Does not apply q1800  . DISCONTD: calcium carbonate  600 mg Oral BID WC  . DISCONTD: Fish Oil  1,200 mg Oral Daily  . DISCONTD: gabapentin  600 mg Oral TID  . DISCONTD: guar gum  1 packet Oral BID WC  . DISCONTD: piperacillin-tazobactam  3.375 g Intravenous Q8H  . DISCONTD: Vitamin D-3  1 tablet Oral Daily   Continuous Infusions:    Assessment/Plan: 1. Likely aspiration pneumonia versus failed outpatient therapy on 2 courses of azithromycin-continue Zosyn day #2. Would escalate antibiotics if patient spikes a fever. We'll get a CBC in the morning, although with knee starting stress dose steroids on her this may be equivocal. 2. Multiple sclerosis-stress dose steroids of Solu-Medrol 80 mg twice a day. We'll continue her regular baclofen via pump/bethanechol 25 mg by mouth twice a day She'll need outpatient neurology involvement in her care 3. Atrial fibrillation on Coumadin-stable. Telemetry shows no red lines. INR was supratherapeutic but has come down to 2.63 today. 4. Hypertension-blood pressures are currently well controlled on diltiazem 120 mg daily, Nebivolol 20 mg daily. 5. Hypothyroidism-last TSH in system noted to be 0.206 on 02/2011. She will need an outpatient TSH once her stress dose steroids have been completed  6. Hyperlipidemia-last LDL in system was 55 on 201i.  On Lovaza which will continue/atorvastatin 10 mg 7.  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  Continue gabapentin 300 mg 3 times a day 8. Depression continue bupropion 300 mg daily, alprazolam 0.5 mg twice a day when necessary 9. diffculrty passing stool/Urine-this is secondary to her MS state and she is on tamsulosin 0.4 mg for this in addition to lactulose as a stimulant 10 mg by mouth twice a day  10. Impaired glucose tolerance on steroids-CBG monitoring needed as well as moderate coverage sliding scale    Code Status: Full Family Communication: None bedside Disposition Plan: In patient   Pleas Koch, MD  [facility] Pager [phone] 11/27/2011, 3:57 PM    LOS: 1 day

## 2011-11-27 NOTE — Evaluation (Signed)
Clinical/Bedside Swallow Evaluation Patient Details  Name: Toni [name] MRN: [id number] DOB: 1946-03-18 Today's Date: 11/27/2011  Past Medical History:  Past Medical History  Diagnosis Date  . Multiple sclerosis     has had this may 1989-[facility] reg doc  . Hypertension   . Atrial fibrillation   . Thyroid disease   . HLD (hyperlipidemia)   . Shortness of breath   . Pneumonia   . Sleep apnea   . Hyperthyroidism    Past Surgical History:  Past Surgical History  Procedure Date  . Cervical fusion     with correction-june 2005  . Infusion pump implantation     baclofen infusion in lower abd  . Vein bypass surgery     radiofreq ablation 1 yr ago-Dr Lynnea Ferrier and [place]  . Back surgery    HPI:  66 y.o. female with hx of MS admitted with aspiration pneumonia vs failed outpatient Rx of CAP.  CXR showed RUL infiltrate.  Pt describes hx of reflux, c/o globus.  ACDF 2005 of C3-C6.   Assessment/Recommendations/Treatment Plan  Clinical Impression: Pt presents with normal oropharyngeal swallow: prandial aspiration not likely source of pna.  Observed and self-described symptoms - belching, regurgitation, globus - are consistent with esophageal-based dysphagia - pt could potentially be experiencing non-prandial aspiration. Rec consideration of further esophageal function, either during this stay or as an outpatient , if warranted.     Risk for Aspiration: Mild Other Related Risk Factors: History of pneumonia;History of GERD  Swallow Evaluation Recommendations Recommended Consults: Consider esophageal assessment Diet Recommendations: Regular;Thin liquid Liquid Administration via: Cup;Straw Medication Administration: Whole meds with liquid Supervision: Patient able to self feed Compensations: Follow solids with liquid Postural Changes and/or Swallow Maneuvers: Seated upright 90 degrees Follow up Recommendations: None     Swallowing Goals n/a    Alexandar Weisenberger L. Samson Frederic, [place] CCC/SLP Pager  [phone]  Blenda Mounts Laurice 11/27/2011,2:22 PM

## 2011-11-27 NOTE — Progress Notes (Signed)
   CARE MANAGEMENT NOTE 11/27/2011  Patient:  Toni [name], Toni [name]   Account Number:  [id number]  Date Initiated:  11/27/2011  Documentation initiated by:  Darlyne Russian  Subjective/Objective Assessment:   Patient admitted with pneumonia.     Action/Plan:   Patient lives at home with her husband, primary caregiver.   Anticipated DC Date:  11/30/2011   Anticipated DC Plan:  HOME W HOME HEALTH SERVICES      DC Planning Services  CM consult      Choice offered to / List presented to:  C-1 Patient           Status of service:  In process, will continue to follow Medicare Important Message given?   (If response is "NO", the following Medicare IM given date fields will be blank) Date Medicare IM given:   Date Additional Medicare IM given:    Discharge Disposition:    Per UR Regulation:    If discussed at Long Length of Stay Meetings, dates discussed:    Comments:  PCP: Dr Felipa Eth  11/27/2011 [address] [place], [place]  [phone] Met with patient to discuss discharge planning. She had [facility] in the past and that would be her choice if needed for home health. CM to continue to follow for discharge planning needs.

## 2011-11-28 LAB — DIFFERENTIAL
Basophils Absolute: 0 10*3/uL (ref 0.0–0.1)
Basophils Relative: 0 % (ref 0–1)
Eosinophils Absolute: 0 10*3/uL (ref 0.0–0.7)
Eosinophils Relative: 0 % (ref 0–5)
Lymphs Abs: 1.1 10*3/uL (ref 0.7–4.0)
Neutrophils Relative %: 68 % (ref 43–77)

## 2011-11-28 LAB — CBC
MCH: 30.9 pg (ref 26.0–34.0)
MCHC: 33.9 g/dL (ref 30.0–36.0)
MCV: 91.2 fL (ref 78.0–100.0)
Platelets: 230 10*3/uL (ref 150–400)
RBC: 4.66 MIL/uL (ref 3.87–5.11)
RDW: 12.8 % (ref 11.5–15.5)

## 2011-11-28 LAB — PROTIME-INR: Prothrombin Time: 20.8 seconds — ABNORMAL HIGH (ref 11.6–15.2)

## 2011-11-28 LAB — GLUCOSE, CAPILLARY
Glucose-Capillary: 89 mg/dL (ref 70–99)
Glucose-Capillary: 163 mg/dL — ABNORMAL HIGH (ref 70–99)

## 2011-11-28 MED ORDER — INTERFERON BETA-1B 0.3 MG ~~LOC~~ SOLR
0.2500 mg | Freq: Once | SUBCUTANEOUS | Status: AC
  Administered 2011-11-28: 0.25 mg via SUBCUTANEOUS

## 2011-11-28 MED ORDER — FUROSEMIDE 40 MG PO TABS
40.0000 mg | ORAL_TABLET | Freq: Two times a day (BID) | ORAL | Status: DC
  Administered 2011-11-28 – 2011-11-30 (×4): 40 mg via ORAL
  Filled 2011-11-28 (×7): qty 1

## 2011-11-28 MED ORDER — WARFARIN SODIUM 5 MG PO TABS
5.0000 mg | ORAL_TABLET | Freq: Once | ORAL | Status: AC
  Administered 2011-11-28: 5 mg via ORAL
  Filled 2011-11-28: qty 1

## 2011-11-28 MED ORDER — POTASSIUM CHLORIDE 20 MEQ PO PACK: 20.0000 meq | PACK | Freq: Two times a day (BID) | ORAL | Status: DC

## 2011-11-28 MED ORDER — POTASSIUM CHLORIDE CRYS ER 20 MEQ PO TBCR
20.0000 meq | EXTENDED_RELEASE_TABLET | Freq: Two times a day (BID) | ORAL | Status: DC
  Administered 2011-11-28 – 2011-11-30 (×5): 20 meq via ORAL
  Filled 2011-11-28 (×6): qty 1

## 2011-11-28 MED ORDER — AMOXICILLIN-POT CLAVULANATE 875-125 MG PO TABS
1.0000 | ORAL_TABLET | Freq: Two times a day (BID) | ORAL | Status: DC
  Administered 2011-11-28 – 2011-11-30 (×5): 1 via ORAL
  Filled 2011-11-28 (×6): qty 1

## 2011-11-28 MED ORDER — INTERFERON BETA-1B 0.3 MG ~~LOC~~ SOLR: 0.2500 mg | Freq: Once | SUBCUTANEOUS | Status: DC

## 2011-11-28 NOTE — Progress Notes (Signed)
Occupational Therapy Treatment Patient Details Name: Toni [name] MRN: [id number] DOB: 1946-08-06 Today's Date: 11/28/2011  OT Assessment/Plan OT Assessment/Plan Comments on Treatment Session: Pt progressing very well with therapy and with improved activity tolerance today requiring only 2 seated rest breaks during ADL tasks OT Plan: Discharge plan remains appropriate OT Frequency: Min 2X/week Follow Up Recommendations: Home health OT;Supervision - Intermittent Equipment Recommended: None recommended by OT OT Goals Acute Rehab OT Goals OT Goal Formulation: With patient Time For Goal Achievement: 7 days ADL Goals Pt Will Perform Grooming: with set-up;with supervision;Standing at sink ADL Goal: Grooming - Progress: Met Pt Will Perform Lower Body Bathing: with set-up;with supervision;Sit to stand from chair;with adaptive equipment ADL Goal: Lower Body Bathing - Progress: Progressing toward goals Pt Will Perform Lower Body Dressing: with set-up;with supervision;Sit to stand from chair;with adaptive equipment ADL Goal: Lower Body Dressing - Progress: Progressing toward goals Pt Will Transfer to Toilet: Ambulation;with DME;with modified independence;3-in-1 ADL Goal: Toilet Transfer - Progress: Progressing toward goals Additional ADL Goal #1: Pt. will recall 3 energy conservation techniques to use with ADLs ADL Goal: Additional Goal #1 - Progress: Progressing toward goals  OT Treatment Precautions/Restrictions  Precautions Precautions: Fall Restrictions Weight Bearing Restrictions: No   ADL ADL Grooming: Performed;Set up;Supervision/safety;Wash/dry face;Wash/dry hands Where Assessed - Grooming: Standing at sink Toilet Transfer: Research scientist (life sciences) Details (indicate cue type and reason): Min verbal cues for hand placement on grab bar Toilet Transfer Method: Proofreader: Comfort height toilet;Grab bars Toileting - Clothing  Manipulation: Performed;Minimal assistance Toileting - Clothing Manipulation Details (indicate cue type and reason): With moving gown Where Assessed - Toileting Clothing Manipulation: Sit to stand from 3-in-1 or toilet Toileting - Hygiene: Performed;Set up;Supervision/safety Where Assessed - Toileting Hygiene: Sit to stand from 3-in-1 or toilet Equipment Used: Rolling walker Ambulation Related to ADLs: Pt. provided with close supervision ~25' with RW ADL Comments: Pt. provided with handout on energy conservation techniques and use of AE for performing LB ADLs. Mobility  Bed Mobility Bed Mobility: No Transfers Sit to Stand: 5: Supervision Sit to Stand Details (indicate cue type and reason): min verbal cues to push from chair/bed     End of Session OT - End of Session Equipment Utilized During Treatment: Gait belt Activity Tolerance: Patient tolerated treatment well Patient left: in chair;with call bell in reach Nurse Communication: Mobility status for transfers General Behavior During Session: [facility] for tasks performed Cognition: [facility] for tasks performed  Aoki Wedemeyer, OTR/L Pager [phone] 11/28/2011, 2:17 PM

## 2011-11-28 NOTE — Progress Notes (Signed)
PROGRESS NOTE  Toni [name] HQI:[id number] DOB: 10-30-1945 DOA: 11/26/2011 PCP: Provider Not In System  Brief narrative: 66 year old female with progressive multiple sclerosis admitted with failed outpatient pneumonia treatment  Past medical history: Multiple sclerosis, primary chronic progressive diagnosed 1989 Aspiration pneumonia? Hypertension, Rate control atrial fibrillation on Coumadin Thyroid disease, Hyperlipidemia, History cardiac cath 07/08/10 with no obstructive changes  Consultants:  None  Procedures:  Chest x-ray 4/7 = no acute infiltrate or pulmonary edema, there is a nodular masslike consolidation right upper lobe measuring 3.7 cm. Although this may be focal pneumonia, mass cannot be excluded and recommended CT with IV contrast   CT chest 4/70) right upper lobe infiltrative process likely due to bronchopneumonia, mild patchy airspace densities within superior segment right lower lobe and left base which are likely due to infection  Antibiotics:  Moxifloxacin 4/7  Zosyn 4/7-   Subjective  Feels much better, still had some wheezing last night. Less despondent. Concerned about whether she needs a chest x-ray. No nausea no vomiting but still coughing Tolerated breakfast this morning and was able to get up. Wishes full catheter out Some low-grade temps   Objective   Interim History: Speech therapy input noted-needs full evaluations still   Objective: Filed Vitals:   11/27/11 1350 11/27/11 2100 11/28/11 0219 11/28/11 0638  BP: 133/58 139/80 128/73 131/76  Pulse: 91 98 85 84  Temp: 98.2 F (36.8 C) 98.5 F (36.9 C) 98.8 F (37.1 C) 97.5 F (36.4 C)  TempSrc: Oral   Oral  Resp: 20 24 20 18   Height:      Weight:    124.5 kg (274 lb 7.6 oz)  SpO2: 95% 94% 92% 95%    Intake/Output Summary (Last 24 hours) at 11/28/11 0942 Last data filed at 11/28/11 0900  Gross per 24 hour  Intake   1323 ml  Output   3601 ml  Net  -2278 ml     Exam:  HEENT-blood shot eyes, Cushingoid appearance, Morbidly obese, post-op changes to the R eye  CHEST-clinically clear, no tvr/no tvf-no change from prior CARDS-s1 s2, no m/r/g-telemetry = normal sinus rhythm ABD-soft,nt,nd, no rebound or garuding  SKIN-swollen LE's, good LE pulses  NEURO-alert, oriented x3  speech: normal in context and clarity  memory: intact grossly  cranial nerves II-XII: intact  no involuntary movements or tremors  At baseline   Data Reviewed: Basic Metabolic Panel:  Lab 11/27/11 1324 11/26/11 1306  NA 138 138  K 3.3* 4.0  CL 99 99  CO2 28 --  GLUCOSE 141* 116*  BUN 14 15  CREATININE 0.63 0.90  CALCIUM 8.8 --  MG -- --  PHOS -- --   Liver Function Tests:  Lab 11/27/11 0510  AST 17  ALT 25  ALKPHOS 66  BILITOT 0.2*  PROT 6.2  ALBUMIN 3.2*   No results found for this basename: LIPASE:5,AMYLASE:5 in the last 168 hours No results found for this basename: AMMONIA:5 in the last 168 hours CBC:  Lab 11/28/11 0600 11/27/11 0510 11/26/11 1306 11/26/11 1253  WBC 5.9 3.8* -- 6.3  NEUTROABS 4.0 -- -- 4.2  HGB 14.4 14.3 15.0 14.7  HCT 42.5 42.9 44.0 42.7  MCV 91.2 90.9 -- 90.7  PLT 230 210 -- 193   Cardiac Enzymes:  Lab 11/26/11 1253  CKTOTAL --  CKMB --  CKMBINDEX --  TROPONINI <0.30   BNP: No components found with this basename: POCBNP:5 CBG:  Lab 11/27/11 2123 11/27/11 1705  GLUCAP 161* 145*  No results found for this or any previous visit (from the past 240 hour(s)).   Studies:              All Imaging reviewed and is as per above notation   Scheduled Meds:    . atorvastatin  10 mg Oral q1800  . baclofen  20 mg Oral BID  . bethanechol  25 mg Oral BID  . buPROPion  300 mg Oral Daily  . calcium carbonate  1 tablet Oral BID WC  . cholecalciferol  1,000 Units Oral Daily  . diltiazem  120 mg Oral Daily  . furosemide  40 mg Oral BID  . gabapentin  300 mg Oral TID  . insulin aspart  0-20 Units Subcutaneous TID WC  .  lactulose  10 g Oral BID  . levothyroxine  75 mcg Oral QAC breakfast  . methylPREDNISolone (SOLU-MEDROL) injection  80 mg Intravenous Q12H  . mulitivitamin with minerals  1 tablet Oral Daily  . nebivolol  20 mg Oral Daily  . omega-3 acid ethyl esters  1 g Oral Daily  . pantoprazole  40 mg Oral Q1200  . piperacillin-tazobactam (ZOSYN)  IV  3.375 g Intravenous Q8H  . potassium chloride SA      . potassium chloride  20 mEq Oral Once  . psyllium  1 packet Oral BID WC  . senna  1 tablet Oral BID  . sodium chloride  3 mL Intravenous Q12H  . Tamsulosin HCl  0.4 mg Oral Daily  . warfarin  2.5 mg Oral ONCE-1800  . Warfarin - Pharmacist Dosing Inpatient   Does not apply q1800  . DISCONTD: ipratropium  0.5 mg Nebulization Q6H  . DISCONTD: piperacillin-tazobactam  3.375 g Intravenous Q8H   Continuous Infusions:    Assessment/Plan: 1. Likely aspiration pneumonia versus failed outpatient therapy on 2 courses of azithromycin-changed to Zosyn 4/8 to Augmentin. Repeat CBC in the morning. Could possibly be discharged if does clinically well over the next 1-2 days. 2. Multiple sclerosis-stress dose steroids of Solu-Medrol 80 mg twice a day. We'll continue her regular baclofen via pump/bethanechol 25 mg by mouth twice a day She'll need outpatient neurology involvement in her care. She is due her Betaseron shot today. Her husband is bringing this and we will allow this to be given 3. Atrial fibrillation on Coumadin-stable. Telemetry shows no red alarms. INR was supratherapeutic but has come down to 2.63 today. 4. Hypertension-blood pressures are currently well controlled on diltiazem 120 mg daily, Nebivolol 20 mg daily. 5. Hypothyroidism-last TSH in system noted to be 0.206 on 02/2011. She will need an outpatient TSH once her stress dose steroids have been completed  6. Hyperlipidemia-last LDL in system was 55 on 201i.  On Lovaza which will continue/atorvastatin 10 mg 7. Lower extremity venous stasis  secondary to varicosities-ordered TED hose for patient's legs.  Continue gabapentin 300 mg 3 times a day 8. Depression continue bupropion 300 mg daily, alprazolam 0.5 mg twice a day when necessary. 9. Diffculty passing stool/Urine-this is secondary to her MS state and she is on tamsulosin 0.4 mg for this in addition to lactulose as a stimulant 10 mg by mouth twice a day-she was is to have her Foley catheter removed and we will do the same. Patient can get up to commode with nursing 10. Hypokalemia-will replace her potassium with oral supplementation 40 mEq. 11. Impaired glucose tolerance-that sugar is 12 01/19/1952 continue sliding scale for now.   Code Status: Full Family Communication: None bedside Disposition  Plan: In patient-no white count or no other issues could potentially go home in one to 2 days   Toni Koch, MD  [facility] Pager [phone] 11/28/2011, 9:42 AM    LOS: 2 days

## 2011-11-28 NOTE — Progress Notes (Signed)
ANTICOAGULATION CONSULT NOTE - Follow Up Consult  Pharmacy Consult for coumadin Indication: atrial fibrillation  Allergies  Allergen Reactions  . Chloraprep One Step     Sores at site  . Hydrocodone   . Zoloft     Patient Measurements: Height: 5\' 4"  (162.6 cm) Weight: 274 lb 7.6 oz (124.5 kg) (bed weight) IBW/kg (Calculated) : 54.7    Vital Signs: Temp: 98.2 F (36.8 C) (04/09 1429) Temp src: Oral (04/09 1429) BP: 128/65 mmHg (04/09 1429) Pulse Rate: 74  (04/09 1429)  Labs:  Basename 11/28/11 0600 11/27/11 0510 11/26/11 1306 11/26/11 1253  HGB 14.4 14.3 -- --  HCT 42.5 42.9 44.0 --  PLT 230 210 -- 193  APTT -- -- -- --  LABPROT 20.8* 28.5* -- 33.6*  INR 1.76* 2.63* -- 3.24*  HEPARINUNFRC -- -- -- --  CREATININE -- 0.63 0.90 --  CKTOTAL -- -- -- --  CKMB -- -- -- --  TROPONINI -- -- -- <0.30   Estimated Creatinine Clearance: 92.6 ml/min (by C-G formula based on Cr of 0.63).  Medical History: Past Medical History  Diagnosis Date  . Multiple sclerosis     has had this may 1989-[facility] reg doc  . Hypertension   . Atrial fibrillation   . Thyroid disease   . HLD (hyperlipidemia)   . Shortness of breath   . Pneumonia   . Sleep apnea   . Hyperthyroidism     Medications:  Scheduled:     . amoxicillin-clavulanate  1 tablet Oral Q12H  . atorvastatin  10 mg Oral q1800  . baclofen  20 mg Oral BID  . bethanechol  25 mg Oral BID  . buPROPion  300 mg Oral Daily  . calcium carbonate  1 tablet Oral BID WC  . cholecalciferol  1,000 Units Oral Daily  . diltiazem  120 mg Oral Daily  . furosemide  40 mg Oral BID  . gabapentin  300 mg Oral TID  . insulin aspart  0-20 Units Subcutaneous TID WC  . interferon beta-1b  0.25 mg Subcutaneous Once  . lactulose  10 g Oral BID  . levothyroxine  75 mcg Oral QAC breakfast  . methylPREDNISolone (SOLU-MEDROL) injection  80 mg Intravenous Q12H  . mulitivitamin with minerals  1 tablet Oral Daily  . nebivolol  20 mg Oral Daily    . omega-3 acid ethyl esters  1 g Oral Daily  . pantoprazole  40 mg Oral Q1200  . potassium chloride SA      . potassium chloride  20 mEq Oral Once  . potassium chloride  20 mEq Oral BID  . psyllium  1 packet Oral BID WC  . senna  1 tablet Oral BID  . sodium chloride  3 mL Intravenous Q12H  . Tamsulosin HCl  0.4 mg Oral Daily  . warfarin  2.5 mg Oral ONCE-1800  . Warfarin - Pharmacist Dosing Inpatient   Does not apply q1800  . DISCONTD: interferon beta-1b  0.25 mg Subcutaneous Once  . DISCONTD: ipratropium  0.5 mg Nebulization Q6H  . DISCONTD: piperacillin-tazobactam (ZOSYN)  IV  3.375 g Intravenous Q8H  . DISCONTD: potassium chloride  20 mEq Oral BID    Assessment: 66 yo female with hx of afib to be continued on coumadin.  PTA coumadin was 5mg  po qday except 2.5mg  po on Mondays. INR today is subtherapeutic likely 2/2 dose held 4/7.  No noted bleeding complications noted.  Antibiotics changed to Augmentin.  Will watch INR for possible  interaction.  Goal of Therapy:  INR 2-3   Plan:  1) Warfarin 5mg  PO x1 today  2) Daily PT/INR   [employer], Pharm.D., BCPS Clinical Pharmacist Pager [phone] 11/28/2011 2:41 PM

## 2011-11-28 NOTE — Progress Notes (Signed)
PT Cancellation Note  Treatment cancelled today due to patient's refusal to participate. Pt very fatigued and reported SOB due to so much activity already today. Pt encouraged to continue participating in therapies and mobility team as able.   Virl Cagey, [place] [phone]  11/28/2011, 3:55 PM

## 2011-11-29 LAB — GLUCOSE, CAPILLARY
Glucose-Capillary: 190 mg/dL — ABNORMAL HIGH (ref 70–99)
Glucose-Capillary: 160 mg/dL — ABNORMAL HIGH (ref 70–99)

## 2011-11-29 LAB — BASIC METABOLIC PANEL
CO2: 30 mEq/L (ref 19–32)
Calcium: 8.9 mg/dL (ref 8.4–10.5)
Chloride: 97 mEq/L (ref 96–112)
Glucose, Bld: 149 mg/dL — ABNORMAL HIGH (ref 70–99)
Potassium: 3.5 mEq/L (ref 3.5–5.1)
Sodium: 138 mEq/L (ref 135–145)

## 2011-11-29 LAB — CBC
Hemoglobin: 13.9 g/dL (ref 12.0–15.0)
MCH: 30.7 pg (ref 26.0–34.0)
MCV: 90.5 fL (ref 78.0–100.0)
Platelets: 210 10*3/uL (ref 150–400)
RBC: 4.53 MIL/uL (ref 3.87–5.11)
WBC: 6.2 10*3/uL (ref 4.0–10.5)

## 2011-11-29 LAB — PROTIME-INR: INR: 1.53 — ABNORMAL HIGH (ref 0.00–1.49)

## 2011-11-29 MED ORDER — WARFARIN SODIUM 7.5 MG PO TABS
7.5000 mg | ORAL_TABLET | Freq: Once | ORAL | Status: AC
  Administered 2011-11-29: 7.5 mg via ORAL
  Filled 2011-11-29: qty 1

## 2011-11-29 NOTE — Progress Notes (Addendum)
Subjective: Patient seen and examined this morning. informs her breathing to be better. Also her weakness and tightness in the extremities are getting better. daughter at bedside.  Objective:  Vital signs in last 24 hours:  Filed Vitals:   11/29/11 0131 11/29/11 0515 11/29/11 1150 11/29/11 1625  BP:  130/78    Pulse: 83 88    Temp:  98.9 F (37.2 C)    TempSrc:  Oral    Resp: 16 18    Height:      Weight:  121.6 kg (268 lb 1.3 oz)    SpO2: 96% 92% 97% 95%    Intake/Output from previous day:   Intake/Output Summary (Last 24 hours) at 11/29/11 1730 Last data filed at 11/29/11 1001  Gross per 24 hour  Intake    360 ml  Output   1800 ml  Net  -1440 ml    Physical Exam:  General: elderly obese female  in no acute distress. HEENT: no pallor, no icterus, moist oral mucosa, no JVD, no lymphadenopathy Heart: Normal  s1 &s2  Regular rate and rhythm, without murmurs, rubs, gallops. Lungs: Clear to auscultation bilaterally. Scattered ronchi Abdomen: Soft, nontender, nondistended, positive bowel sounds. Extremities: No clubbing cyanosis or edema with positive pedal pulses. Neuro: Alert, awake, oriented x3, nonfocal.   Lab Results:  Basic Metabolic Panel:    Component Value Date/Time   NA 138 11/29/2011 0645   K 3.5 11/29/2011 0645   CL 97 11/29/2011 0645   CO2 30 11/29/2011 0645   BUN 21 11/29/2011 0645   CREATININE 0.71 11/29/2011 0645   GLUCOSE 149* 11/29/2011 0645   CALCIUM 8.9 11/29/2011 0645   CBC:    Component Value Date/Time   WBC 6.2 11/29/2011 0645   HGB 13.9 11/29/2011 0645   HCT 41.0 11/29/2011 0645   PLT 210 11/29/2011 0645   MCV 90.5 11/29/2011 0645   NEUTROABS 4.0 11/28/2011 0600   LYMPHSABS 1.1 11/28/2011 0600   MONOABS 0.7 11/28/2011 0600   EOSABS 0.0 11/28/2011 0600   BASOSABS 0.0 11/28/2011 0600    No results found for this or any previous visit (from the past 240 hour(s)).  Studies/Results: No results found.  Medications: Scheduled Meds:   .  amoxicillin-clavulanate  1 tablet Oral Q12H  . atorvastatin  10 mg Oral q1800  . baclofen  20 mg Oral BID  . bethanechol  25 mg Oral BID  . buPROPion  300 mg Oral Daily  . calcium carbonate  1 tablet Oral BID WC  . cholecalciferol  1,000 Units Oral Daily  . diltiazem  120 mg Oral Daily  . furosemide  40 mg Oral BID  . gabapentin  300 mg Oral TID  . insulin aspart  0-20 Units Subcutaneous TID WC  . lactulose  10 g Oral BID  . levothyroxine  75 mcg Oral QAC breakfast  . methylPREDNISolone (SOLU-MEDROL) injection  80 mg Intravenous Q12H  . mulitivitamin with minerals  1 tablet Oral Daily  . nebivolol  20 mg Oral Daily  . omega-3 acid ethyl esters  1 g Oral Daily  . pantoprazole  40 mg Oral Q1200  . potassium chloride  20 mEq Oral BID  . psyllium  1 packet Oral BID WC  . senna  1 tablet Oral BID  . sodium chloride  3 mL Intravenous Q12H  . Tamsulosin HCl  0.4 mg Oral Daily  . warfarin  5 mg Oral ONCE-1800  . warfarin  7.5 mg Oral ONCE-1800  . Warfarin -  Pharmacist Dosing Inpatient   Does not apply q1800  . DISCONTD: furosemide  40 mg Oral BID   Continuous Infusions:  PRN Meds:.acetaminophen, albuterol, ALPRAZolam  Assessment/Plan: 66 year old female with progressive multiple sclerosis admitted with failed outpatient treatment for pneumonia and ? MS flair.    *Aspiration pneumonia vs Failed outpatient Rx of CAP Patient has some level of esophageal dysfunction on swallow eval. Can get w/up as outpt once acute issue resolves Started on zosyn and levaquin and now switched to Augmentin clinically improved  cont prn nebs    Multiple sclerosis, primary chronic progressive Had symptoms of extremity weakness and urinary symptoms which is now improving  getting stress dose steroid for 5 days with concern for flair Follows up with her neurologist as outpt   Hypertension Stable   cont home meds   Atrial fibrillation INR sub therapeutic now Resume coumadin, dosing per  pharmacy   Depression Cont home meds   Full code  D/c home tomorrow if continues to improve    LOS: 3 days   Toni [name] 11/29/2011, 5:30 PM

## 2011-11-29 NOTE — Progress Notes (Signed)
Placed pt on cpap with full face mask at her request. Started on 12 cmH2O, but felt too strong. Placed on 11 cmH2O at her request because she uses this at home. Pt said she was going to take off for a while & her daughter would place it back on when ready. I told them to call me if they had any trouble.  Jacqulynn Cadet RRT

## 2011-11-29 NOTE — Progress Notes (Signed)
Physical Therapy Treatment Patient Details Name: Toni [name] MRN: [id number] DOB: 05/30/1946 Today's Date: 11/29/2011  PT Assessment/Plan  PT - Assessment/Plan Comments on Treatment Session: Pt  doing much better with mobility and activity tolerance.  Should be appropriate for discharge home when cleared by MD.   PT Plan: Discharge plan remains appropriate PT Frequency: Min 3X/week Follow Up Recommendations: No PT follow up Equipment Recommended: None recommended by PT PT Goals  Acute Rehab PT Goals PT Goal Formulation: With patient Time For Goal Achievement: 7 days Pt will Stand: with modified independence;6 - 10 min;with no upper extremity support PT Goal: Stand - Progress: Met Pt will Ambulate: 51 - 150 feet;with modified independence;with least restrictive assistive device PT Goal: Ambulate - Progress: Progressing toward goal Pt will Go Up / Down Stairs: 3-5 stairs;Independently;with rail(s) PT Goal: Up/Down Stairs - Progress: Not met Pt will Perform Home Exercise Program: Independently PT Goal: Perform Home Exercise Program - Progress: Progressing toward goal  PT Treatment Precautions/Restrictions  Precautions Precautions: Fall Restrictions Weight Bearing Restrictions: No Mobility (including Balance) Bed Mobility Bed Mobility: No Transfers Transfers: Yes Sit to Stand: 7: Independent;From chair/3-in-1 Stand to Sit: 7: Independent;To chair/3-in-1 Ambulation/Gait Ambulation/Gait: Yes Ambulation/Gait Assistance: 6: Modified independent (Device/Increase time) Ambulation Distance (Feet): 40 Feet (40 feet x 2) Assistive device: Rolling walker Gait Pattern: Step-through pattern;Trunk flexed Stairs: No Wheelchair Mobility Wheelchair Mobility: No  Posture/Postural Control Posture/Postural Control: No significant limitations Balance Balance Assessed: No Exercise  Total Joint Exercises Long Arc Quad: Strengthening;Right;10 reps;Seated End of Session PT - End of  Session Equipment Utilized During Treatment: Gait belt Activity Tolerance: Patient tolerated treatment well Patient left: in chair;with family/visitor present Nurse Communication: Mobility status for transfers;Mobility status for ambulation General Behavior During Session: [facility] for tasks performed Cognition: [facility] for tasks performed  Galit Urich 11/29/2011, 3:46 PM Darwin Guastella L. Ryken Paschal DPT [phone]

## 2011-11-29 NOTE — Progress Notes (Signed)
ANTICOAGULATION CONSULT NOTE - Follow Up Consult  Pharmacy Consult for coumadin Indication: atrial fibrillation  Allergies  Allergen Reactions  . Chloraprep One Step     Sores at site  . Hydrocodone   . Zoloft     Patient Measurements: Height: 5\' 4"  (162.6 cm) Weight: 268 lb 1.3 oz (121.6 kg) (scale B) IBW/kg (Calculated) : 54.7    Vital Signs: Temp: 98.9 F (37.2 C) (04/10 0515) Temp src: Oral (04/10 0515) BP: 130/78 mmHg (04/10 0515) Pulse Rate: 88  (04/10 0515)  Labs:  Basename 11/29/11 0645 11/28/11 0600 11/27/11 0510 11/26/11 1306 11/26/11 1253  HGB 13.9 14.4 -- -- --  HCT 41.0 42.5 42.9 -- --  PLT 210 230 210 -- --  APTT -- -- -- -- --  LABPROT 18.7* 20.8* 28.5* -- --  INR 1.53* 1.76* 2.63* -- --  HEPARINUNFRC -- -- -- -- --  CREATININE 0.71 -- 0.63 0.90 --  CKTOTAL -- -- -- -- --  CKMB -- -- -- -- --  TROPONINI -- -- -- -- <0.30   Estimated Creatinine Clearance: 91.4 ml/min (by C-G formula based on Cr of 0.71).  Medical History: Past Medical History  Diagnosis Date  . Multiple sclerosis     has had this may 1989-[facility] reg doc  . Hypertension   . Atrial fibrillation   . Thyroid disease   . HLD (hyperlipidemia)   . Shortness of breath   . Pneumonia   . Sleep apnea   . Hyperthyroidism     Medications:  Scheduled:     . amoxicillin-clavulanate  1 tablet Oral Q12H  . atorvastatin  10 mg Oral q1800  . baclofen  20 mg Oral BID  . bethanechol  25 mg Oral BID  . buPROPion  300 mg Oral Daily  . calcium carbonate  1 tablet Oral BID WC  . cholecalciferol  1,000 Units Oral Daily  . diltiazem  120 mg Oral Daily  . furosemide  40 mg Oral BID  . gabapentin  300 mg Oral TID  . insulin aspart  0-20 Units Subcutaneous TID WC  . interferon beta-1b  0.25 mg Subcutaneous Once  . lactulose  10 g Oral BID  . levothyroxine  75 mcg Oral QAC breakfast  . methylPREDNISolone (SOLU-MEDROL) injection  80 mg Intravenous Q12H  . mulitivitamin with minerals  1  tablet Oral Daily  . nebivolol  20 mg Oral Daily  . omega-3 acid ethyl esters  1 g Oral Daily  . pantoprazole  40 mg Oral Q1200  . potassium chloride  20 mEq Oral BID  . psyllium  1 packet Oral BID WC  . senna  1 tablet Oral BID  . sodium chloride  3 mL Intravenous Q12H  . Tamsulosin HCl  0.4 mg Oral Daily  . warfarin  5 mg Oral ONCE-1800  . Warfarin - Pharmacist Dosing Inpatient   Does not apply q1800  . DISCONTD: furosemide  40 mg Oral BID    Assessment: 66 yo female with hx of afib to be continued on coumadin.  PTA coumadin was 5mg  po qday except 2.5mg  po on Mondays. INR today remains subtherapeutic and is trending down.  Will increase tonight's dose.  No bleeding complications noted.  Augmentin day#2.  Will watch INR for possible interaction.  Goal of Therapy:  INR 2-3   Plan:  1) Warfarin 7.5mg  PO x1 today  2) Daily PT/INR   [employer], Pharm.D., BCPS Clinical Pharmacist Pager [phone] 11/29/2011 12:40 PM

## 2011-11-30 LAB — PROTIME-INR: Prothrombin Time: 22.3 seconds — ABNORMAL HIGH (ref 11.6–15.2)

## 2011-11-30 MED ORDER — GUAIFENESIN ER 600 MG PO TB12: 1200.0000 mg | ORAL_TABLET | Freq: Two times a day (BID) | ORAL | Status: AC

## 2011-11-30 MED ORDER — AMOXICILLIN-POT CLAVULANATE 875-125 MG PO TABS: 1.0000 | ORAL_TABLET | Freq: Two times a day (BID) | ORAL | Status: AC

## 2011-11-30 NOTE — Progress Notes (Signed)
Physical Therapy Treatment Patient Details Name: Toni [name] MRN: [id number] DOB: [birth year] Today's Date: 11/30/2011  PT Assessment/Plan  PT - Assessment/Plan Comments on Treatment Session: Pt has met all acute PT goals PT Plan: Discharge plan remains appropriate PT Frequency: Min 3X/week Follow Up Recommendations: No PT follow up Equipment Recommended: None recommended by PT PT Goals  Acute Rehab PT Goals PT Goal Formulation: With patient Time For Goal Achievement: 7 days Pt will Stand: with modified independence;6 - 10 min;with no upper extremity support PT Goal: Stand - Progress: Met Pt will Ambulate: 51 - 150 feet;with modified independence;with least restrictive assistive device PT Goal: Ambulate - Progress: Met Pt will Go Up / Down Stairs: 3-5 stairs;Independently;with rail(s) PT Goal: Up/Down Stairs - Progress: Met Pt will Perform Home Exercise Program: Independently PT Goal: Perform Home Exercise Program - Progress: Met  PT Treatment Precautions/Restrictions  Precautions Precautions: Fall Restrictions Weight Bearing Restrictions: No Mobility (including Balance) Bed Mobility Bed Mobility: No Transfers Transfers: Yes Sit to Stand: 7: Independent;From chair/3-in-1 Stand to Sit: 7: Independent;To chair/3-in-1 Ambulation/Gait Ambulation/Gait: Yes Ambulation/Gait Assistance: 6: Modified independent (Device/Increase time) Ambulation/Gait Assistance Details (indicate cue type and reason): Cues for upright trunk posture.  Ambulation Distance (Feet): 70 Feet Assistive device: Rolling walker Gait Pattern: Step-through pattern;Trunk flexed Stairs: Yes Stairs Assistance: 6: Modified independent (Device/Increase time) Stair Management Technique: One rail Right;Sideways Number of Stairs: 3  Height of Stairs: 8  Wheelchair Mobility Wheelchair Mobility: No  Posture/Postural Control Posture/Postural Control: No significant limitations Balance Balance Assessed:  No Exercise  Total Joint Exercises Long Arc Quad: Strengthening;Right;10 reps;Seated End of Session PT - End of Session Equipment Utilized During Treatment: Gait belt Activity Tolerance: Patient tolerated treatment well Patient left: in chair;with family/visitor present Nurse Communication: Mobility status for transfers;Mobility status for ambulation General Behavior During Session: [facility] for tasks performed Cognition: [facility] for tasks performed  Trinitee Horgan 11/30/2011, 1:47 PM Rishit Burkhalter L. Jeris Easterly DPT [phone]

## 2011-11-30 NOTE — Progress Notes (Signed)
Pt given DC instructions and pt verbalized understanding. Pt DC home via w/c.  

## 2011-11-30 NOTE — Discharge Summary (Signed)
Patient ID: Toni [name] MRN: [id number] DOB/AGE: [birth year] 65 y.o.  Admit date: 11/26/2011 Discharge date: 11/30/2011  Primary Care Physician:  Hoyle Sauer, MD, MD  Discharge Diagnoses:    Principal Problem:  *Aspiration pneumonia vs Failed outpatient Rx of CAP  Active Problems:  Multiple sclerosis, primary chronic progressive  Hypertension  Atrial fibrillation  Thyroid disease  HLD (hyperlipidemia)   Medication List  As of 11/30/2011 12:32 PM   TAKE these medications         ALPRAZolam 0.5 MG tablet   Commonly known as: XANAX   Take 0.5 mg by mouth 2 (two) times daily as needed. For anxiety.      amoxicillin-clavulanate 875-125 MG per tablet   Commonly known as: AUGMENTIN   Take 1 tablet by mouth every 12 (twelve) hours.      atorvastatin 10 MG tablet   Commonly known as: LIPITOR   Take 10 mg by mouth daily.      baclofen 20 MG tablet   Commonly known as: LIORESAL   Take 20 mg by mouth 2 (two) times daily.      BENEFIBER PO   Take 1 packet by mouth 2 (two) times daily.      bethanechol 25 MG tablet   Commonly known as: URECHOLINE   Take 25 mg by mouth 2 (two) times daily.      buPROPion 300 MG 24 hr tablet   Commonly known as: WELLBUTRIN XL   Take 300 mg by mouth daily.      calcium carbonate 600 MG Tabs   Commonly known as: OS-CAL   Take 600 mg by mouth 2 (two) times daily with a meal.      cetirizine 10 MG tablet   Commonly known as: ZYRTEC   Take 10 mg by mouth daily.      diltiazem 120 MG 24 hr capsule   Commonly known as: DILACOR XR   Take 120 mg by mouth daily.      Fish Oil 600 MG Caps   Take 1,200 mg by mouth daily.      furosemide 40 MG tablet   Commonly known as: LASIX   Take 40 mg by mouth 2 (two) times daily.      gabapentin 300 MG capsule   Commonly known as: NEURONTIN   Take 300 mg by mouth 3 (three) times daily.      guaiFENesin 600 MG 12 hr tablet   Commonly known as: MUCINEX   Take 2 tablets (1,200 mg total) by  mouth 2 (two) times daily.      lactulose 10 GM/15ML solution   Commonly known as: CHRONULAC   Take 10 g by mouth 2 (two) times daily.      levothyroxine 75 MCG tablet   Commonly known as: SYNTHROID, LEVOTHROID   Take 75 mcg by mouth daily.      mulitivitamin with minerals Tabs   Take 1 tablet by mouth daily.      nebivolol 10 MG tablet   Commonly known as: BYSTOLIC   Take 20 mg by mouth daily.      omeprazole 20 MG capsule   Commonly known as: PRILOSEC   Take 20 mg by mouth 2 (two) times daily.      Tamsulosin HCl 0.4 MG Caps   Commonly known as: FLOMAX   Take 0.4 mg by mouth daily.      trimethoprim 100 MG tablet   Commonly known as: TRIMPEX   Take 100 mg by  mouth daily.      Vitamin D-3 5000 UNITS Tabs   Take 1 tablet by mouth daily.      warfarin 2.5 MG tablet   Commonly known as: COUMADIN   Take 2.5-5 mg by mouth daily. Take 5 MG every day except Mondays; on Mondays, take 2.5 MG            Disposition and Follow-up: Follow up with PCP in 1 week, needs evaluation for ? Esophageal dysphagia as outpt Follow up with neurology as outpt  Consults:   none  Significant Diagnostic Studies:  Dg Chest 2 View  11/26/2011  *RADIOLOGY REPORT*  Clinical Data: Shortness of breath  CHEST - 2 VIEW  Comparison: 03/10/2011  Findings: Cardiomediastinal silhouette is stable.  There is no acute infiltrate or pulmonary edema.  There is a nodular mass like consolidation in the right upper lobe measures 3.7 cm.  Although this may be due to focal pneumonia, pulmonary mass cannot be excluded.  Further evaluation with CT scan of the chest with IV contrast is recommended.  IMPRESSION:  There is no acute infiltrate or pulmonary edema.  There is a nodular mass like consolidation in the right upper lobe measures 3.7 cm.  Although this may be due to focal pneumonia, pulmonary mass cannot be excluded.  Further evaluation with CT scan of the chest with IV contrast is recommended.  Original Report  Authenticated By: Natasha Mead, M.D.   Ct Chest W Contrast  11/26/2011  *RADIOLOGY REPORT*  Clinical Data: Shortness of breath, cough and fever.  CT CHEST WITH CONTRAST  Technique:  Multidetector CT imaging of the chest was performed following the standard protocol during bolus administration of intravenous contrast.  Contrast: 80mL OMNIPAQUE IOHEXOL 300 MG/ML  SOLN  Comparison: 11/26/2011.  Findings: Heart size is normal.  There is no supraclavicular or axillary adenopathy.  No enlarged mediastinal or hilar adenopathy.  No pericardial or pleural effusion.  Spinal stimulator is identified within the thoracic canal.  No upper lobe infiltrative process containing air bronchograms and surrounding ground-glass attenuation is identified.  The appearance is most consistent with bronchopneumonia.  The patchy airspace densities are also noted within the superior segment of the right lower lobe and the left posterior lung base.  The tracheobronchial tree appears normal.  Review of the visualized osseous structures is significant for mild multilevel thoracic spondylosis.  T11 vertebral hemangioma is noted, image number 44.  Limited imaging through the upper abdomen shows fatty infiltration of the liver.  IMPRESSION:  1.  Right upper lobe infiltrative process likely due to bronchopneumonia.  Clinical and radiographic followup is recommended to ensure complete resolution. 2.  Mild patchy airspace densities are noted within the superior segment of the right lower lobe and in the left base which are likely due to infection.  Original Report Authenticated By: Rosealee Albee, M.D.    Brief H and P: For complete details please refer to admission H and P, but in brief 66 year old female with progressive multiple sclerosis admitted with failed outpatient treatment for pneumonia and ? MS flair.   Physical Exam on Discharge:  Filed Vitals:   11/29/11 2137 11/29/11 2143 11/30/11 0548 11/30/11 1001  BP: 139/81  133/70 143/68    Pulse: 80 80 76   Temp: 99 F (37.2 C)  98.7 F (37.1 C)   TempSrc: Oral  Oral   Resp: 18 18 20    Height:      Weight:   120.339 kg (265 lb 4.8 oz)  SpO2: 94% 94% 97%      Intake/Output Summary (Last 24 hours) at 11/30/11 1232 Last data filed at 11/30/11 1004  Gross per 24 hour  Intake    903 ml  Output   3000 ml  Net  -2097 ml    General: elderly obese female in no acute distress.  HEENT: no pallor, no icterus, moist oral mucosa, no JVD, no lymphadenopathy  Heart: Normal s1 &s2 Regular rate and rhythm, without murmurs, rubs, gallops.  Lungs: Clear to auscultation bilaterally.  Abdomen: Soft, nontender, nondistended, positive bowel sounds.  Extremities: No clubbing cyanosis or edema with positive pedal pulses.  Neuro: Alert, awake, oriented x3, nonfocal.   CBC:    Component Value Date/Time   WBC 6.2 11/29/2011 0645   HGB 13.9 11/29/2011 0645   HCT 41.0 11/29/2011 0645   PLT 210 11/29/2011 0645   MCV 90.5 11/29/2011 0645   NEUTROABS 4.0 11/28/2011 0600   LYMPHSABS 1.1 11/28/2011 0600   MONOABS 0.7 11/28/2011 0600   EOSABS 0.0 11/28/2011 0600   BASOSABS 0.0 11/28/2011 0600    Basic Metabolic Panel:    Component Value Date/Time   NA 138 11/29/2011 0645   K 3.5 11/29/2011 0645   CL 97 11/29/2011 0645   CO2 30 11/29/2011 0645   BUN 21 11/29/2011 0645   CREATININE 0.71 11/29/2011 0645   GLUCOSE 149* 11/29/2011 0645   CALCIUM 8.9 11/29/2011 0645    Hospital Course:  *Aspiration pneumonia vs Failed outpatient Rx of CAP  Patient has some level of esophageal dysfunction on swallow eval. Can get w/up as outpt once acute issue resolves  Started on zosyn and levaquin and now switched to Augmentin .will be prescribed 5 more days of abx to complete a 1 0 day course clinically improved   Multiple sclerosis, primary chronic progressive  Had symptoms of extremity weakness and urinary symptoms which is now improving  getting stress dose steroid for 5 days with concern for flair and  completes prior to discharge Follows up with her neurologist as outpt   Hypertension  Stable  cont home meds   Atrial fibrillation  INR sub therapeutic ,. Dose adjusted per pharmacy. 1.92 this am. Can resume home dose on d/c   Patient clinically improved and can be discharged home with follow up with her PCP and neurologist  Time spent on Discharge: 45 minutes  Signed: Eddie North 11/30/2011, 12:32 PM

## 2011-11-30 NOTE — Progress Notes (Signed)
   CARE MANAGEMENT NOTE 11/30/2011  Patient:  Toni [name], Toni [name]   Account Number:  [id number]  Date Initiated:  11/27/2011  Documentation initiated by:  Darlyne Russian  Subjective/Objective Assessment:   Patient admitted with pneumonia.     Action/Plan:   Patient lives at home with her husband, primary caregiver.   Anticipated DC Date:  11/30/2011   Anticipated DC Plan:  HOME W HOME HEALTH SERVICES      DC Planning Services  CM consult      Choice offered to / List presented to:  C-1 Patient        HH arranged  HH-2 PT  HH-3 OT      [facility] agency  [facility]   Status of service:  Completed, signed off Medicare Important Message given?   (If response is "NO", the following Medicare IM given date fields will be blank) Date Medicare IM given:   Date Additional Medicare IM given:    Discharge Disposition:  HOME W HOME HEALTH SERVICES  Per UR Regulation:  Reviewed for med. necessity/level of care/duration of stay  If discussed at Long Length of Stay Meetings, dates discussed:    Comments:  PCP: Dr Felipa Eth  11/29/11 Onnie Boer, RN, BSN 1607 PT WORKING WITH THE PT AND WILL NEED PT/OT WHEN DC'D HOME. [facility] NOTIFIED AS PT HAS HAD THEM IN THE PAST.  [facility] IS FOR HOME TOMORROW  11/27/2011 [address] [place], [place]  [phone] Met with patient to discuss discharge planning. She had [facility] in the past and that would be her choice if needed for home health. CM to continue to follow for discharge planning needs.  1134 Onnie Boer, Charity fundraiser, BSN UR COMPLETED

## 2011-12-28 ENCOUNTER — Other Ambulatory Visit: Payer: Self-pay | Admitting: Internal Medicine

## 2011-12-28 ENCOUNTER — Ambulatory Visit
Admission: RE | Admit: 2011-12-28 | Discharge: 2011-12-28 | Disposition: A | Discharge status: Home / Self Care | Payer: Medicare Other | Source: Ambulatory Visit | Attending: Internal Medicine | Admitting: Internal Medicine

## 2011-12-28 DIAGNOSIS — J189 Pneumonia, unspecified organism: Secondary | ICD-10-CM

## 2012-02-07 DIAGNOSIS — Z961 Presence of intraocular lens: Secondary | ICD-10-CM | POA: Insufficient documentation

## 2012-02-12 ENCOUNTER — Other Ambulatory Visit: Payer: Self-pay | Admitting: Gynecology

## 2012-02-12 DIAGNOSIS — Z1231 Encounter for screening mammogram for malignant neoplasm of breast: Secondary | ICD-10-CM

## 2012-03-05 ENCOUNTER — Ambulatory Visit
Admission: RE | Admit: 2012-03-05 | Discharge: 2012-03-05 | Disposition: A | Discharge status: Home / Self Care | Payer: Medicare Other | Source: Ambulatory Visit | Attending: Gynecology | Admitting: Gynecology

## 2012-03-05 DIAGNOSIS — Z1231 Encounter for screening mammogram for malignant neoplasm of breast: Secondary | ICD-10-CM

## 2012-04-11 ENCOUNTER — Other Ambulatory Visit (HOSPITAL_COMMUNITY): Payer: Self-pay | Admitting: Gynecology

## 2012-04-11 DIAGNOSIS — N83201 Unspecified ovarian cyst, right side: Secondary | ICD-10-CM

## 2012-04-15 ENCOUNTER — Ambulatory Visit (HOSPITAL_COMMUNITY): Payer: Medicare Other

## 2012-04-29 ENCOUNTER — Ambulatory Visit (HOSPITAL_COMMUNITY): Payer: Medicare Other

## 2012-05-01 DIAGNOSIS — Z7901 Long term (current) use of anticoagulants: Secondary | ICD-10-CM | POA: Diagnosis not present

## 2012-05-01 DIAGNOSIS — I4891 Unspecified atrial fibrillation: Secondary | ICD-10-CM | POA: Diagnosis not present

## 2012-05-08 ENCOUNTER — Ambulatory Visit (HOSPITAL_COMMUNITY): Payer: Medicare Other

## 2012-05-13 DIAGNOSIS — Z124 Encounter for screening for malignant neoplasm of cervix: Secondary | ICD-10-CM | POA: Diagnosis not present

## 2012-05-13 DIAGNOSIS — R87610 Atypical squamous cells of undetermined significance on cytologic smear of cervix (ASC-US): Secondary | ICD-10-CM | POA: Diagnosis not present

## 2012-05-13 DIAGNOSIS — Z01419 Encounter for gynecological examination (general) (routine) without abnormal findings: Secondary | ICD-10-CM | POA: Diagnosis not present

## 2012-05-14 DIAGNOSIS — G35 Multiple sclerosis: Secondary | ICD-10-CM | POA: Diagnosis not present

## 2012-05-16 ENCOUNTER — Ambulatory Visit (HOSPITAL_COMMUNITY): Admission: RE | Admit: 2012-05-16 | Payer: Medicare Other | Source: Ambulatory Visit

## 2012-05-21 DIAGNOSIS — R269 Unspecified abnormalities of gait and mobility: Secondary | ICD-10-CM | POA: Diagnosis not present

## 2012-05-21 DIAGNOSIS — G35 Multiple sclerosis: Secondary | ICD-10-CM | POA: Diagnosis not present

## 2012-05-23 DIAGNOSIS — G4733 Obstructive sleep apnea (adult) (pediatric): Secondary | ICD-10-CM | POA: Diagnosis not present

## 2012-05-23 DIAGNOSIS — I4891 Unspecified atrial fibrillation: Secondary | ICD-10-CM | POA: Diagnosis not present

## 2012-05-23 DIAGNOSIS — G35 Multiple sclerosis: Secondary | ICD-10-CM | POA: Diagnosis not present

## 2012-05-23 DIAGNOSIS — I10 Essential (primary) hypertension: Secondary | ICD-10-CM | POA: Diagnosis not present

## 2012-05-23 DIAGNOSIS — R32 Unspecified urinary incontinence: Secondary | ICD-10-CM | POA: Diagnosis not present

## 2012-05-23 DIAGNOSIS — E7800 Pure hypercholesterolemia, unspecified: Secondary | ICD-10-CM | POA: Diagnosis not present

## 2012-05-24 DIAGNOSIS — I10 Essential (primary) hypertension: Secondary | ICD-10-CM | POA: Diagnosis not present

## 2012-05-24 DIAGNOSIS — I4891 Unspecified atrial fibrillation: Secondary | ICD-10-CM | POA: Diagnosis not present

## 2012-05-24 DIAGNOSIS — E7800 Pure hypercholesterolemia, unspecified: Secondary | ICD-10-CM | POA: Diagnosis not present

## 2012-05-24 DIAGNOSIS — G4733 Obstructive sleep apnea (adult) (pediatric): Secondary | ICD-10-CM | POA: Diagnosis not present

## 2012-05-24 DIAGNOSIS — G35 Multiple sclerosis: Secondary | ICD-10-CM | POA: Diagnosis not present

## 2012-05-24 DIAGNOSIS — R32 Unspecified urinary incontinence: Secondary | ICD-10-CM | POA: Diagnosis not present

## 2012-05-25 DIAGNOSIS — G4733 Obstructive sleep apnea (adult) (pediatric): Secondary | ICD-10-CM | POA: Diagnosis not present

## 2012-05-25 DIAGNOSIS — I10 Essential (primary) hypertension: Secondary | ICD-10-CM | POA: Diagnosis not present

## 2012-05-25 DIAGNOSIS — R32 Unspecified urinary incontinence: Secondary | ICD-10-CM | POA: Diagnosis not present

## 2012-05-25 DIAGNOSIS — G35 Multiple sclerosis: Secondary | ICD-10-CM | POA: Diagnosis not present

## 2012-05-25 DIAGNOSIS — E7800 Pure hypercholesterolemia, unspecified: Secondary | ICD-10-CM | POA: Diagnosis not present

## 2012-05-25 DIAGNOSIS — I4891 Unspecified atrial fibrillation: Secondary | ICD-10-CM | POA: Diagnosis not present

## 2012-05-28 DIAGNOSIS — G35 Multiple sclerosis: Secondary | ICD-10-CM | POA: Diagnosis not present

## 2012-05-28 DIAGNOSIS — I4891 Unspecified atrial fibrillation: Secondary | ICD-10-CM | POA: Diagnosis not present

## 2012-05-28 DIAGNOSIS — G4733 Obstructive sleep apnea (adult) (pediatric): Secondary | ICD-10-CM | POA: Diagnosis not present

## 2012-05-28 DIAGNOSIS — I10 Essential (primary) hypertension: Secondary | ICD-10-CM | POA: Diagnosis not present

## 2012-05-28 DIAGNOSIS — E7800 Pure hypercholesterolemia, unspecified: Secondary | ICD-10-CM | POA: Diagnosis not present

## 2012-05-28 DIAGNOSIS — R32 Unspecified urinary incontinence: Secondary | ICD-10-CM | POA: Diagnosis not present

## 2012-05-30 DIAGNOSIS — G4733 Obstructive sleep apnea (adult) (pediatric): Secondary | ICD-10-CM | POA: Diagnosis not present

## 2012-05-30 DIAGNOSIS — Z7901 Long term (current) use of anticoagulants: Secondary | ICD-10-CM | POA: Diagnosis not present

## 2012-05-30 DIAGNOSIS — I4891 Unspecified atrial fibrillation: Secondary | ICD-10-CM | POA: Diagnosis not present

## 2012-05-30 DIAGNOSIS — G35 Multiple sclerosis: Secondary | ICD-10-CM | POA: Diagnosis not present

## 2012-05-30 DIAGNOSIS — R32 Unspecified urinary incontinence: Secondary | ICD-10-CM | POA: Diagnosis not present

## 2012-05-30 DIAGNOSIS — E7800 Pure hypercholesterolemia, unspecified: Secondary | ICD-10-CM | POA: Diagnosis not present

## 2012-05-30 DIAGNOSIS — I10 Essential (primary) hypertension: Secondary | ICD-10-CM | POA: Diagnosis not present

## 2012-05-30 DIAGNOSIS — N390 Urinary tract infection, site not specified: Secondary | ICD-10-CM | POA: Diagnosis not present

## 2012-06-04 DIAGNOSIS — I10 Essential (primary) hypertension: Secondary | ICD-10-CM | POA: Diagnosis not present

## 2012-06-04 DIAGNOSIS — G4733 Obstructive sleep apnea (adult) (pediatric): Secondary | ICD-10-CM | POA: Diagnosis not present

## 2012-06-04 DIAGNOSIS — E7800 Pure hypercholesterolemia, unspecified: Secondary | ICD-10-CM | POA: Diagnosis not present

## 2012-06-04 DIAGNOSIS — I4891 Unspecified atrial fibrillation: Secondary | ICD-10-CM | POA: Diagnosis not present

## 2012-06-04 DIAGNOSIS — R32 Unspecified urinary incontinence: Secondary | ICD-10-CM | POA: Diagnosis not present

## 2012-06-04 DIAGNOSIS — G35 Multiple sclerosis: Secondary | ICD-10-CM | POA: Diagnosis not present

## 2012-06-06 DIAGNOSIS — E7800 Pure hypercholesterolemia, unspecified: Secondary | ICD-10-CM | POA: Diagnosis not present

## 2012-06-06 DIAGNOSIS — R32 Unspecified urinary incontinence: Secondary | ICD-10-CM | POA: Diagnosis not present

## 2012-06-06 DIAGNOSIS — I4891 Unspecified atrial fibrillation: Secondary | ICD-10-CM | POA: Diagnosis not present

## 2012-06-06 DIAGNOSIS — I10 Essential (primary) hypertension: Secondary | ICD-10-CM | POA: Diagnosis not present

## 2012-06-06 DIAGNOSIS — G35 Multiple sclerosis: Secondary | ICD-10-CM | POA: Diagnosis not present

## 2012-06-06 DIAGNOSIS — G4733 Obstructive sleep apnea (adult) (pediatric): Secondary | ICD-10-CM | POA: Diagnosis not present

## 2012-06-10 DIAGNOSIS — G4733 Obstructive sleep apnea (adult) (pediatric): Secondary | ICD-10-CM | POA: Diagnosis not present

## 2012-06-10 DIAGNOSIS — R32 Unspecified urinary incontinence: Secondary | ICD-10-CM | POA: Diagnosis not present

## 2012-06-10 DIAGNOSIS — E7800 Pure hypercholesterolemia, unspecified: Secondary | ICD-10-CM | POA: Diagnosis not present

## 2012-06-10 DIAGNOSIS — I10 Essential (primary) hypertension: Secondary | ICD-10-CM | POA: Diagnosis not present

## 2012-06-10 DIAGNOSIS — G35 Multiple sclerosis: Secondary | ICD-10-CM | POA: Diagnosis not present

## 2012-06-10 DIAGNOSIS — I4891 Unspecified atrial fibrillation: Secondary | ICD-10-CM | POA: Diagnosis not present

## 2012-06-12 DIAGNOSIS — G35 Multiple sclerosis: Secondary | ICD-10-CM | POA: Diagnosis not present

## 2012-06-17 DIAGNOSIS — E7800 Pure hypercholesterolemia, unspecified: Secondary | ICD-10-CM | POA: Diagnosis not present

## 2012-06-17 DIAGNOSIS — G35 Multiple sclerosis: Secondary | ICD-10-CM | POA: Diagnosis not present

## 2012-06-17 DIAGNOSIS — I4891 Unspecified atrial fibrillation: Secondary | ICD-10-CM | POA: Diagnosis not present

## 2012-06-17 DIAGNOSIS — R32 Unspecified urinary incontinence: Secondary | ICD-10-CM | POA: Diagnosis not present

## 2012-06-17 DIAGNOSIS — G4733 Obstructive sleep apnea (adult) (pediatric): Secondary | ICD-10-CM | POA: Diagnosis not present

## 2012-06-17 DIAGNOSIS — I10 Essential (primary) hypertension: Secondary | ICD-10-CM | POA: Diagnosis not present

## 2012-06-18 DIAGNOSIS — Z23 Encounter for immunization: Secondary | ICD-10-CM | POA: Diagnosis not present

## 2012-06-24 DIAGNOSIS — Z7901 Long term (current) use of anticoagulants: Secondary | ICD-10-CM | POA: Diagnosis not present

## 2012-06-24 DIAGNOSIS — I4891 Unspecified atrial fibrillation: Secondary | ICD-10-CM | POA: Diagnosis not present

## 2012-06-24 DIAGNOSIS — I10 Essential (primary) hypertension: Secondary | ICD-10-CM | POA: Diagnosis not present

## 2012-06-25 DIAGNOSIS — N319 Neuromuscular dysfunction of bladder, unspecified: Secondary | ICD-10-CM | POA: Diagnosis not present

## 2012-06-25 DIAGNOSIS — G4733 Obstructive sleep apnea (adult) (pediatric): Secondary | ICD-10-CM | POA: Diagnosis not present

## 2012-06-25 DIAGNOSIS — G35 Multiple sclerosis: Secondary | ICD-10-CM | POA: Diagnosis not present

## 2012-06-25 DIAGNOSIS — R269 Unspecified abnormalities of gait and mobility: Secondary | ICD-10-CM | POA: Diagnosis not present

## 2012-06-26 DIAGNOSIS — N3090 Cystitis, unspecified without hematuria: Secondary | ICD-10-CM | POA: Diagnosis not present

## 2012-06-26 DIAGNOSIS — N312 Flaccid neuropathic bladder, not elsewhere classified: Secondary | ICD-10-CM | POA: Diagnosis not present

## 2012-06-26 DIAGNOSIS — R300 Dysuria: Secondary | ICD-10-CM | POA: Diagnosis not present

## 2012-06-26 DIAGNOSIS — R3915 Urgency of urination: Secondary | ICD-10-CM | POA: Diagnosis not present

## 2012-06-27 DIAGNOSIS — R32 Unspecified urinary incontinence: Secondary | ICD-10-CM | POA: Diagnosis not present

## 2012-06-27 DIAGNOSIS — E7800 Pure hypercholesterolemia, unspecified: Secondary | ICD-10-CM | POA: Diagnosis not present

## 2012-06-27 DIAGNOSIS — I10 Essential (primary) hypertension: Secondary | ICD-10-CM | POA: Diagnosis not present

## 2012-06-27 DIAGNOSIS — G4733 Obstructive sleep apnea (adult) (pediatric): Secondary | ICD-10-CM | POA: Diagnosis not present

## 2012-06-27 DIAGNOSIS — G35 Multiple sclerosis: Secondary | ICD-10-CM | POA: Diagnosis not present

## 2012-06-27 DIAGNOSIS — I4891 Unspecified atrial fibrillation: Secondary | ICD-10-CM | POA: Diagnosis not present

## 2012-06-28 DIAGNOSIS — G4733 Obstructive sleep apnea (adult) (pediatric): Secondary | ICD-10-CM | POA: Diagnosis not present

## 2012-06-28 DIAGNOSIS — I10 Essential (primary) hypertension: Secondary | ICD-10-CM | POA: Diagnosis not present

## 2012-06-28 DIAGNOSIS — G35 Multiple sclerosis: Secondary | ICD-10-CM | POA: Diagnosis not present

## 2012-06-28 DIAGNOSIS — I4891 Unspecified atrial fibrillation: Secondary | ICD-10-CM | POA: Diagnosis not present

## 2012-06-28 DIAGNOSIS — E7800 Pure hypercholesterolemia, unspecified: Secondary | ICD-10-CM | POA: Diagnosis not present

## 2012-06-28 DIAGNOSIS — R32 Unspecified urinary incontinence: Secondary | ICD-10-CM | POA: Diagnosis not present

## 2012-07-02 DIAGNOSIS — G35 Multiple sclerosis: Secondary | ICD-10-CM | POA: Diagnosis not present

## 2012-07-02 DIAGNOSIS — G4733 Obstructive sleep apnea (adult) (pediatric): Secondary | ICD-10-CM | POA: Diagnosis not present

## 2012-07-02 DIAGNOSIS — E7800 Pure hypercholesterolemia, unspecified: Secondary | ICD-10-CM | POA: Diagnosis not present

## 2012-07-02 DIAGNOSIS — I4891 Unspecified atrial fibrillation: Secondary | ICD-10-CM | POA: Diagnosis not present

## 2012-07-02 DIAGNOSIS — I10 Essential (primary) hypertension: Secondary | ICD-10-CM | POA: Diagnosis not present

## 2012-07-02 DIAGNOSIS — R32 Unspecified urinary incontinence: Secondary | ICD-10-CM | POA: Diagnosis not present

## 2012-07-05 DIAGNOSIS — G35 Multiple sclerosis: Secondary | ICD-10-CM | POA: Diagnosis not present

## 2012-07-05 DIAGNOSIS — I10 Essential (primary) hypertension: Secondary | ICD-10-CM | POA: Diagnosis not present

## 2012-07-05 DIAGNOSIS — I4891 Unspecified atrial fibrillation: Secondary | ICD-10-CM | POA: Diagnosis not present

## 2012-07-05 DIAGNOSIS — G4733 Obstructive sleep apnea (adult) (pediatric): Secondary | ICD-10-CM | POA: Diagnosis not present

## 2012-07-05 DIAGNOSIS — R32 Unspecified urinary incontinence: Secondary | ICD-10-CM | POA: Diagnosis not present

## 2012-07-05 DIAGNOSIS — E7800 Pure hypercholesterolemia, unspecified: Secondary | ICD-10-CM | POA: Diagnosis not present

## 2012-07-08 DIAGNOSIS — R32 Unspecified urinary incontinence: Secondary | ICD-10-CM | POA: Diagnosis not present

## 2012-07-08 DIAGNOSIS — I4891 Unspecified atrial fibrillation: Secondary | ICD-10-CM | POA: Diagnosis not present

## 2012-07-08 DIAGNOSIS — E7800 Pure hypercholesterolemia, unspecified: Secondary | ICD-10-CM | POA: Diagnosis not present

## 2012-07-08 DIAGNOSIS — G4733 Obstructive sleep apnea (adult) (pediatric): Secondary | ICD-10-CM | POA: Diagnosis not present

## 2012-07-08 DIAGNOSIS — I10 Essential (primary) hypertension: Secondary | ICD-10-CM | POA: Diagnosis not present

## 2012-07-08 DIAGNOSIS — G35 Multiple sclerosis: Secondary | ICD-10-CM | POA: Diagnosis not present

## 2012-07-09 DIAGNOSIS — Z1331 Encounter for screening for depression: Secondary | ICD-10-CM | POA: Diagnosis not present

## 2012-07-09 DIAGNOSIS — R609 Edema, unspecified: Secondary | ICD-10-CM | POA: Diagnosis not present

## 2012-07-09 DIAGNOSIS — I10 Essential (primary) hypertension: Secondary | ICD-10-CM | POA: Diagnosis not present

## 2012-07-09 DIAGNOSIS — E039 Hypothyroidism, unspecified: Secondary | ICD-10-CM | POA: Diagnosis not present

## 2012-07-09 DIAGNOSIS — E119 Type 2 diabetes mellitus without complications: Secondary | ICD-10-CM | POA: Diagnosis not present

## 2012-07-09 DIAGNOSIS — E785 Hyperlipidemia, unspecified: Secondary | ICD-10-CM | POA: Diagnosis not present

## 2012-07-11 DIAGNOSIS — I4891 Unspecified atrial fibrillation: Secondary | ICD-10-CM | POA: Diagnosis not present

## 2012-07-11 DIAGNOSIS — R32 Unspecified urinary incontinence: Secondary | ICD-10-CM | POA: Diagnosis not present

## 2012-07-11 DIAGNOSIS — G35 Multiple sclerosis: Secondary | ICD-10-CM | POA: Diagnosis not present

## 2012-07-11 DIAGNOSIS — I10 Essential (primary) hypertension: Secondary | ICD-10-CM | POA: Diagnosis not present

## 2012-07-11 DIAGNOSIS — G4733 Obstructive sleep apnea (adult) (pediatric): Secondary | ICD-10-CM | POA: Diagnosis not present

## 2012-07-11 DIAGNOSIS — E7800 Pure hypercholesterolemia, unspecified: Secondary | ICD-10-CM | POA: Diagnosis not present

## 2012-07-15 DIAGNOSIS — G4733 Obstructive sleep apnea (adult) (pediatric): Secondary | ICD-10-CM | POA: Diagnosis not present

## 2012-07-15 DIAGNOSIS — R32 Unspecified urinary incontinence: Secondary | ICD-10-CM | POA: Diagnosis not present

## 2012-07-15 DIAGNOSIS — I10 Essential (primary) hypertension: Secondary | ICD-10-CM | POA: Diagnosis not present

## 2012-07-15 DIAGNOSIS — E7800 Pure hypercholesterolemia, unspecified: Secondary | ICD-10-CM | POA: Diagnosis not present

## 2012-07-15 DIAGNOSIS — G35 Multiple sclerosis: Secondary | ICD-10-CM | POA: Diagnosis not present

## 2012-07-15 DIAGNOSIS — I4891 Unspecified atrial fibrillation: Secondary | ICD-10-CM | POA: Diagnosis not present

## 2012-07-16 DIAGNOSIS — G35 Multiple sclerosis: Secondary | ICD-10-CM | POA: Diagnosis not present

## 2012-07-16 DIAGNOSIS — I10 Essential (primary) hypertension: Secondary | ICD-10-CM | POA: Diagnosis not present

## 2012-07-16 DIAGNOSIS — G4733 Obstructive sleep apnea (adult) (pediatric): Secondary | ICD-10-CM | POA: Diagnosis not present

## 2012-07-16 DIAGNOSIS — E7800 Pure hypercholesterolemia, unspecified: Secondary | ICD-10-CM | POA: Diagnosis not present

## 2012-07-16 DIAGNOSIS — R32 Unspecified urinary incontinence: Secondary | ICD-10-CM | POA: Diagnosis not present

## 2012-07-16 DIAGNOSIS — I4891 Unspecified atrial fibrillation: Secondary | ICD-10-CM | POA: Diagnosis not present

## 2012-07-22 DIAGNOSIS — G35 Multiple sclerosis: Secondary | ICD-10-CM | POA: Diagnosis not present

## 2012-07-22 DIAGNOSIS — E7800 Pure hypercholesterolemia, unspecified: Secondary | ICD-10-CM | POA: Diagnosis not present

## 2012-07-22 DIAGNOSIS — Z79899 Other long term (current) drug therapy: Secondary | ICD-10-CM | POA: Diagnosis not present

## 2012-07-22 DIAGNOSIS — I4891 Unspecified atrial fibrillation: Secondary | ICD-10-CM | POA: Diagnosis not present

## 2012-07-22 DIAGNOSIS — G4733 Obstructive sleep apnea (adult) (pediatric): Secondary | ICD-10-CM | POA: Diagnosis not present

## 2012-07-22 DIAGNOSIS — R32 Unspecified urinary incontinence: Secondary | ICD-10-CM | POA: Diagnosis not present

## 2012-07-22 DIAGNOSIS — I10 Essential (primary) hypertension: Secondary | ICD-10-CM | POA: Diagnosis not present

## 2012-07-22 DIAGNOSIS — IMO0001 Reserved for inherently not codable concepts without codable children: Secondary | ICD-10-CM | POA: Diagnosis not present

## 2012-07-23 DIAGNOSIS — I4891 Unspecified atrial fibrillation: Secondary | ICD-10-CM | POA: Diagnosis not present

## 2012-07-23 DIAGNOSIS — Z7901 Long term (current) use of anticoagulants: Secondary | ICD-10-CM | POA: Diagnosis not present

## 2012-07-25 DIAGNOSIS — I10 Essential (primary) hypertension: Secondary | ICD-10-CM | POA: Diagnosis not present

## 2012-07-25 DIAGNOSIS — I4891 Unspecified atrial fibrillation: Secondary | ICD-10-CM | POA: Diagnosis not present

## 2012-07-25 DIAGNOSIS — G35 Multiple sclerosis: Secondary | ICD-10-CM | POA: Diagnosis not present

## 2012-07-25 DIAGNOSIS — IMO0001 Reserved for inherently not codable concepts without codable children: Secondary | ICD-10-CM | POA: Diagnosis not present

## 2012-07-25 DIAGNOSIS — G4733 Obstructive sleep apnea (adult) (pediatric): Secondary | ICD-10-CM | POA: Diagnosis not present

## 2012-07-25 DIAGNOSIS — E7800 Pure hypercholesterolemia, unspecified: Secondary | ICD-10-CM | POA: Diagnosis not present

## 2012-07-29 DIAGNOSIS — G4733 Obstructive sleep apnea (adult) (pediatric): Secondary | ICD-10-CM | POA: Diagnosis not present

## 2012-07-29 DIAGNOSIS — G35 Multiple sclerosis: Secondary | ICD-10-CM | POA: Diagnosis not present

## 2012-07-29 DIAGNOSIS — I4891 Unspecified atrial fibrillation: Secondary | ICD-10-CM | POA: Diagnosis not present

## 2012-07-29 DIAGNOSIS — E7800 Pure hypercholesterolemia, unspecified: Secondary | ICD-10-CM | POA: Diagnosis not present

## 2012-07-29 DIAGNOSIS — I10 Essential (primary) hypertension: Secondary | ICD-10-CM | POA: Diagnosis not present

## 2012-07-29 DIAGNOSIS — IMO0001 Reserved for inherently not codable concepts without codable children: Secondary | ICD-10-CM | POA: Diagnosis not present

## 2012-08-01 DIAGNOSIS — G4733 Obstructive sleep apnea (adult) (pediatric): Secondary | ICD-10-CM | POA: Diagnosis not present

## 2012-08-01 DIAGNOSIS — IMO0001 Reserved for inherently not codable concepts without codable children: Secondary | ICD-10-CM | POA: Diagnosis not present

## 2012-08-01 DIAGNOSIS — I10 Essential (primary) hypertension: Secondary | ICD-10-CM | POA: Diagnosis not present

## 2012-08-01 DIAGNOSIS — E7800 Pure hypercholesterolemia, unspecified: Secondary | ICD-10-CM | POA: Diagnosis not present

## 2012-08-01 DIAGNOSIS — I4891 Unspecified atrial fibrillation: Secondary | ICD-10-CM | POA: Diagnosis not present

## 2012-08-01 DIAGNOSIS — G35 Multiple sclerosis: Secondary | ICD-10-CM | POA: Diagnosis not present

## 2012-08-06 DIAGNOSIS — N3090 Cystitis, unspecified without hematuria: Secondary | ICD-10-CM | POA: Diagnosis not present

## 2012-08-06 DIAGNOSIS — R300 Dysuria: Secondary | ICD-10-CM | POA: Diagnosis not present

## 2012-08-07 DIAGNOSIS — I10 Essential (primary) hypertension: Secondary | ICD-10-CM | POA: Diagnosis not present

## 2012-08-07 DIAGNOSIS — I4891 Unspecified atrial fibrillation: Secondary | ICD-10-CM | POA: Diagnosis not present

## 2012-08-07 DIAGNOSIS — G35 Multiple sclerosis: Secondary | ICD-10-CM | POA: Diagnosis not present

## 2012-08-07 DIAGNOSIS — E7800 Pure hypercholesterolemia, unspecified: Secondary | ICD-10-CM | POA: Diagnosis not present

## 2012-08-07 DIAGNOSIS — G4733 Obstructive sleep apnea (adult) (pediatric): Secondary | ICD-10-CM | POA: Diagnosis not present

## 2012-08-07 DIAGNOSIS — IMO0001 Reserved for inherently not codable concepts without codable children: Secondary | ICD-10-CM | POA: Diagnosis not present

## 2012-08-08 DIAGNOSIS — R269 Unspecified abnormalities of gait and mobility: Secondary | ICD-10-CM | POA: Diagnosis not present

## 2012-08-08 DIAGNOSIS — G35 Multiple sclerosis: Secondary | ICD-10-CM | POA: Diagnosis not present

## 2012-08-13 DIAGNOSIS — I10 Essential (primary) hypertension: Secondary | ICD-10-CM | POA: Diagnosis not present

## 2012-08-13 DIAGNOSIS — G35 Multiple sclerosis: Secondary | ICD-10-CM | POA: Diagnosis not present

## 2012-08-13 DIAGNOSIS — I4891 Unspecified atrial fibrillation: Secondary | ICD-10-CM | POA: Diagnosis not present

## 2012-08-13 DIAGNOSIS — G4733 Obstructive sleep apnea (adult) (pediatric): Secondary | ICD-10-CM | POA: Diagnosis not present

## 2012-08-13 DIAGNOSIS — IMO0001 Reserved for inherently not codable concepts without codable children: Secondary | ICD-10-CM | POA: Diagnosis not present

## 2012-08-13 DIAGNOSIS — E7800 Pure hypercholesterolemia, unspecified: Secondary | ICD-10-CM | POA: Diagnosis not present

## 2012-08-16 DIAGNOSIS — G35 Multiple sclerosis: Secondary | ICD-10-CM | POA: Diagnosis not present

## 2012-08-16 DIAGNOSIS — E7800 Pure hypercholesterolemia, unspecified: Secondary | ICD-10-CM | POA: Diagnosis not present

## 2012-08-16 DIAGNOSIS — G4733 Obstructive sleep apnea (adult) (pediatric): Secondary | ICD-10-CM | POA: Diagnosis not present

## 2012-08-16 DIAGNOSIS — I10 Essential (primary) hypertension: Secondary | ICD-10-CM | POA: Diagnosis not present

## 2012-08-16 DIAGNOSIS — IMO0001 Reserved for inherently not codable concepts without codable children: Secondary | ICD-10-CM | POA: Diagnosis not present

## 2012-08-16 DIAGNOSIS — I4891 Unspecified atrial fibrillation: Secondary | ICD-10-CM | POA: Diagnosis not present

## 2012-08-20 DIAGNOSIS — Z7901 Long term (current) use of anticoagulants: Secondary | ICD-10-CM | POA: Diagnosis not present

## 2012-08-20 DIAGNOSIS — I4891 Unspecified atrial fibrillation: Secondary | ICD-10-CM | POA: Diagnosis not present

## 2012-08-22 DIAGNOSIS — IMO0001 Reserved for inherently not codable concepts without codable children: Secondary | ICD-10-CM | POA: Diagnosis not present

## 2012-08-22 DIAGNOSIS — I4891 Unspecified atrial fibrillation: Secondary | ICD-10-CM | POA: Diagnosis not present

## 2012-08-22 DIAGNOSIS — I10 Essential (primary) hypertension: Secondary | ICD-10-CM | POA: Diagnosis not present

## 2012-08-22 DIAGNOSIS — G4733 Obstructive sleep apnea (adult) (pediatric): Secondary | ICD-10-CM | POA: Diagnosis not present

## 2012-08-22 DIAGNOSIS — G35 Multiple sclerosis: Secondary | ICD-10-CM | POA: Diagnosis not present

## 2012-08-22 DIAGNOSIS — E7800 Pure hypercholesterolemia, unspecified: Secondary | ICD-10-CM | POA: Diagnosis not present

## 2012-08-27 DIAGNOSIS — N368 Other specified disorders of urethra: Secondary | ICD-10-CM | POA: Diagnosis not present

## 2012-08-27 DIAGNOSIS — N312 Flaccid neuropathic bladder, not elsewhere classified: Secondary | ICD-10-CM | POA: Diagnosis not present

## 2012-08-27 DIAGNOSIS — R300 Dysuria: Secondary | ICD-10-CM | POA: Diagnosis not present

## 2012-08-27 DIAGNOSIS — N3090 Cystitis, unspecified without hematuria: Secondary | ICD-10-CM | POA: Diagnosis not present

## 2012-09-06 DIAGNOSIS — Z7901 Long term (current) use of anticoagulants: Secondary | ICD-10-CM | POA: Diagnosis not present

## 2012-09-06 DIAGNOSIS — I4891 Unspecified atrial fibrillation: Secondary | ICD-10-CM | POA: Diagnosis not present

## 2012-09-20 DIAGNOSIS — I4891 Unspecified atrial fibrillation: Secondary | ICD-10-CM | POA: Diagnosis not present

## 2012-09-20 DIAGNOSIS — Z7901 Long term (current) use of anticoagulants: Secondary | ICD-10-CM | POA: Diagnosis not present

## 2012-09-20 DIAGNOSIS — R300 Dysuria: Secondary | ICD-10-CM | POA: Diagnosis not present

## 2012-09-20 DIAGNOSIS — N952 Postmenopausal atrophic vaginitis: Secondary | ICD-10-CM | POA: Diagnosis not present

## 2012-09-20 DIAGNOSIS — N3090 Cystitis, unspecified without hematuria: Secondary | ICD-10-CM | POA: Diagnosis not present

## 2012-09-27 ENCOUNTER — Other Ambulatory Visit (HOSPITAL_COMMUNITY): Payer: Self-pay | Admitting: Internal Medicine

## 2012-09-27 DIAGNOSIS — M7989 Other specified soft tissue disorders: Secondary | ICD-10-CM | POA: Diagnosis not present

## 2012-09-27 DIAGNOSIS — I83893 Varicose veins of bilateral lower extremities with other complications: Secondary | ICD-10-CM | POA: Diagnosis not present

## 2012-09-27 DIAGNOSIS — N3000 Acute cystitis without hematuria: Secondary | ICD-10-CM | POA: Diagnosis not present

## 2012-09-27 DIAGNOSIS — R300 Dysuria: Secondary | ICD-10-CM | POA: Diagnosis not present

## 2012-09-27 DIAGNOSIS — R600 Localized edema: Secondary | ICD-10-CM

## 2012-10-03 ENCOUNTER — Encounter (HOSPITAL_COMMUNITY): Payer: Medicare Other

## 2012-10-08 DIAGNOSIS — G253 Myoclonus: Secondary | ICD-10-CM | POA: Diagnosis not present

## 2012-10-08 DIAGNOSIS — G35 Multiple sclerosis: Secondary | ICD-10-CM | POA: Diagnosis not present

## 2012-10-08 DIAGNOSIS — N319 Neuromuscular dysfunction of bladder, unspecified: Secondary | ICD-10-CM | POA: Diagnosis not present

## 2012-10-08 DIAGNOSIS — E678 Other specified hyperalimentation: Secondary | ICD-10-CM | POA: Diagnosis not present

## 2012-10-12 ENCOUNTER — Encounter: Payer: Self-pay | Admitting: Neurology

## 2012-10-16 ENCOUNTER — Other Ambulatory Visit (HOSPITAL_COMMUNITY): Payer: Self-pay | Admitting: Internal Medicine

## 2012-10-16 DIAGNOSIS — M79609 Pain in unspecified limb: Secondary | ICD-10-CM

## 2012-10-17 DIAGNOSIS — I4891 Unspecified atrial fibrillation: Secondary | ICD-10-CM | POA: Diagnosis not present

## 2012-10-17 DIAGNOSIS — Z7901 Long term (current) use of anticoagulants: Secondary | ICD-10-CM | POA: Diagnosis not present

## 2012-10-21 DIAGNOSIS — G35 Multiple sclerosis: Secondary | ICD-10-CM | POA: Diagnosis not present

## 2012-10-24 DIAGNOSIS — Z5181 Encounter for therapeutic drug level monitoring: Secondary | ICD-10-CM | POA: Diagnosis not present

## 2012-10-24 DIAGNOSIS — I872 Venous insufficiency (chronic) (peripheral): Secondary | ICD-10-CM | POA: Diagnosis not present

## 2012-10-24 DIAGNOSIS — G35 Multiple sclerosis: Secondary | ICD-10-CM | POA: Diagnosis not present

## 2012-11-01 DIAGNOSIS — R300 Dysuria: Secondary | ICD-10-CM | POA: Diagnosis not present

## 2012-11-01 DIAGNOSIS — R3989 Other symptoms and signs involving the genitourinary system: Secondary | ICD-10-CM | POA: Diagnosis not present

## 2012-11-05 ENCOUNTER — Ambulatory Visit: Payer: Self-pay | Admitting: Internal Medicine

## 2012-11-05 DIAGNOSIS — I4891 Unspecified atrial fibrillation: Secondary | ICD-10-CM

## 2012-11-05 DIAGNOSIS — Z7901 Long term (current) use of anticoagulants: Secondary | ICD-10-CM | POA: Insufficient documentation

## 2012-11-06 ENCOUNTER — Encounter (HOSPITAL_COMMUNITY): Payer: Medicare Other

## 2012-11-08 DIAGNOSIS — Z7901 Long term (current) use of anticoagulants: Secondary | ICD-10-CM | POA: Diagnosis not present

## 2012-11-08 DIAGNOSIS — I4891 Unspecified atrial fibrillation: Secondary | ICD-10-CM | POA: Diagnosis not present

## 2012-11-11 ENCOUNTER — Ambulatory Visit (INDEPENDENT_AMBULATORY_CARE_PROVIDER_SITE_OTHER): Payer: Medicare Other | Admitting: Neurology

## 2012-11-11 DIAGNOSIS — Z13820 Encounter for screening for osteoporosis: Secondary | ICD-10-CM | POA: Diagnosis not present

## 2012-11-11 DIAGNOSIS — R87619 Unspecified abnormal cytological findings in specimens from cervix uteri: Secondary | ICD-10-CM | POA: Diagnosis not present

## 2012-11-11 DIAGNOSIS — IMO0002 Reserved for concepts with insufficient information to code with codable children: Secondary | ICD-10-CM

## 2012-11-11 DIAGNOSIS — G35 Multiple sclerosis: Secondary | ICD-10-CM

## 2012-11-11 DIAGNOSIS — Z9189 Other specified personal risk factors, not elsewhere classified: Secondary | ICD-10-CM | POA: Diagnosis not present

## 2012-11-11 DIAGNOSIS — M949 Disorder of cartilage, unspecified: Secondary | ICD-10-CM | POA: Diagnosis not present

## 2012-11-11 DIAGNOSIS — M899 Disorder of bone, unspecified: Secondary | ICD-10-CM | POA: Diagnosis not present

## 2012-11-11 NOTE — Procedures (Signed)
Toni [name] is a 67 year old white female with a history of multiple sclerosis. The patient indicates that since last seen, she has not had any further urinary tract infections. Overall, she believes that the spasticity level in the lower extremities is adequate for her. Occasionally, she will have to take an oral baclofen tablets. The patient denies any significant changes in spasticity levels. She is satisfied with the current pump settings. The patient returns for a baclofen pump refill.  Baclofen pump refill note  The baclofen pump site was cleaned with Betadine solution. A 21-gauge needle was inserted into the pump port site. Approximately 5 cc of residual baclofen was removed. 40 cc of replacement baclofen was placed into the pump.  The pump was reprogrammed for the following settings: The basal rate is set at 34.0 mcg per hour. Between the hours of 1 AM and 5 AM, the rate is lowered to 25.0 mcg per hour. The total daily dose is 770.9 mcg per day.  The alarm volume is kept at 1.5 cc. The next alarm date is 02/18/2013.  The patient tolerated the procedure well. There were no complications of the above procedure.

## 2012-11-12 ENCOUNTER — Ambulatory Visit: Payer: Self-pay | Admitting: Neurology

## 2012-11-13 DIAGNOSIS — D235 Other benign neoplasm of skin of trunk: Secondary | ICD-10-CM | POA: Diagnosis not present

## 2012-11-15 DIAGNOSIS — I4891 Unspecified atrial fibrillation: Secondary | ICD-10-CM | POA: Diagnosis not present

## 2012-11-15 DIAGNOSIS — E119 Type 2 diabetes mellitus without complications: Secondary | ICD-10-CM | POA: Diagnosis not present

## 2012-11-15 DIAGNOSIS — E039 Hypothyroidism, unspecified: Secondary | ICD-10-CM | POA: Diagnosis not present

## 2012-11-15 DIAGNOSIS — K219 Gastro-esophageal reflux disease without esophagitis: Secondary | ICD-10-CM | POA: Diagnosis not present

## 2012-11-15 DIAGNOSIS — Z7901 Long term (current) use of anticoagulants: Secondary | ICD-10-CM | POA: Diagnosis not present

## 2012-11-15 DIAGNOSIS — F341 Dysthymic disorder: Secondary | ICD-10-CM | POA: Diagnosis not present

## 2012-11-15 DIAGNOSIS — E785 Hyperlipidemia, unspecified: Secondary | ICD-10-CM | POA: Diagnosis not present

## 2012-11-15 DIAGNOSIS — G35 Multiple sclerosis: Secondary | ICD-10-CM | POA: Diagnosis not present

## 2012-11-26 ENCOUNTER — Telehealth: Payer: Self-pay | Admitting: Neurology

## 2012-11-26 DIAGNOSIS — G35 Multiple sclerosis: Secondary | ICD-10-CM

## 2012-11-26 NOTE — Telephone Encounter (Signed)
Will process order through [facility] .

## 2012-11-26 NOTE — Telephone Encounter (Signed)
Pt calling for betaseron antibody test results.   Also wanted to have [facility] PT ordered.   I spoke to pt and let her know the results of Interferon - Beta IgG and <1.6 units (reference range <4.0units).   Order for PT [facility] (she would like to have Janna Arch as her PT).  Had last PT in Fall 2013.  Feels like she needs it again.

## 2012-11-27 NOTE — Telephone Encounter (Signed)
I faxed over the order for [facility] PT.  They were not able to accomodate at this time.  I spoke with pt and relayed this message and will retry next week for availability.  Pt did not want to use someone different.

## 2012-11-29 NOTE — Telephone Encounter (Signed)
Faxed referral to [facility], patient not wanting to go any place else but [facility], cancelled referral to [place].

## 2012-12-05 DIAGNOSIS — Z7901 Long term (current) use of anticoagulants: Secondary | ICD-10-CM | POA: Diagnosis not present

## 2012-12-05 DIAGNOSIS — I4891 Unspecified atrial fibrillation: Secondary | ICD-10-CM | POA: Diagnosis not present

## 2012-12-10 ENCOUNTER — Telehealth: Payer: Self-pay | Admitting: *Deleted

## 2012-12-10 NOTE — Telephone Encounter (Signed)
I called patient. The patient has had a 2 day history of increased tightness around the abdomen, soreness around the rib cage. The patient is having pain at the pump site. It appears that the spasticity in the legs has worsened, the patient has difficulty standing. This could be a MS attack. I will need to see the patient tomorrow afternoon, and interrogate the pump. If everything appears to be working normally, I'll give the patient following prednisone.

## 2012-12-10 NOTE — Telephone Encounter (Signed)
Patient called stating she thinks something going on with the pump because last night it was very tight in the area of pump. Patient is having a hard time straighting out and standing up. Patient stated she would like to come in and have the pump check.

## 2012-12-11 ENCOUNTER — Encounter: Payer: Self-pay | Admitting: Neurology

## 2012-12-11 ENCOUNTER — Ambulatory Visit (INDEPENDENT_AMBULATORY_CARE_PROVIDER_SITE_OTHER): Payer: Medicare Other | Admitting: Neurology

## 2012-12-11 DIAGNOSIS — G35 Multiple sclerosis: Secondary | ICD-10-CM | POA: Diagnosis not present

## 2012-12-11 NOTE — Progress Notes (Signed)
Reason for visit: Multiple sclerosis  Toni [name] is an 67 y.o. female  History of present illness:  Toni [name] is a 67 year old right-handed white female with a history of multiple sclerosis associated with a gait disorder. The patient has spasticity involving the lower extremities, and she has had a baclofen pump placement. The patient has done fairly well with this in general. The patient however, began feeling poorly within the last 4 or 5 days, with increased spasms in the abdomen, and around the right side of the abdomen and lower chest area, associated with a burning sensation in the mouth, and the right leg. The patient has clumsiness of the right arm that has developed, and she has difficulty handling a pen or a pencil. The patient believes that her speech has become slightly slurred and slow. The patient has not been sleeping well. The patient been taking oral baclofen, and she comes into the office today for an evaluation. The patient denies any change in vision. The patient has not been running fevers or chills, and she denies any dysuria or change in color or odor of her urine.  Past Medical History  Diagnosis Date  . Multiple sclerosis     has had this may 1989-[facility] reg doc  . Hypertension   . Atrial fibrillation   . Thyroid disease   . HLD (hyperlipidemia)   . Shortness of breath   . Pneumonia   . Sleep apnea   . Hyperthyroidism     Past Surgical History  Procedure Laterality Date  . Cervical fusion      with correction-june 2005  . Infusion pump implantation      baclofen infusion in lower abd  . Vein bypass surgery      radiofreq ablation 1 yr ago-Dr Lynnea Ferrier and [place]  . Back surgery      Family History  Problem Relation Age of Onset  . Coronary artery disease Father     at age 25  . Coronary artery disease Maternal Grandmother   . Cancer Paternal Grandmother     Social history:  reports that she has quit smoking. She does not have any smokeless  tobacco history on file. She reports that she does not drink alcohol or use illicit drugs.  Allergies:  Allergies  Allergen Reactions  . Chlorhexidine Gluconate     Sores at site  . Hydrocodone   . Sertraline Hcl     Medications:  Current Outpatient Prescriptions on File Prior to Visit  Medication Sig Dispense Refill  . ALPRAZolam (XANAX) 0.5 MG tablet Take 0.5 mg by mouth 2 (two) times daily as needed. For anxiety.      . baclofen (LIORESAL) 20 MG tablet Take 20 mg by mouth 2 (two) times daily.      Marland Kitchen buPROPion (WELLBUTRIN XL) 300 MG 24 hr tablet Take 300 mg by mouth daily.      . calcium carbonate (OS-CAL) 600 MG TABS Take 600 mg by mouth 2 (two) times daily with a meal.      . cetirizine (ZYRTEC) 10 MG tablet Take 10 mg by mouth daily.      . furosemide (LASIX) 40 MG tablet Take 40 mg by mouth 2 (two) times daily.      Marland Kitchen gabapentin (NEURONTIN) 300 MG capsule Take 300 mg by mouth 3 (three) times daily.      Marland Kitchen lactulose (CHRONULAC) 10 GM/15ML solution Take 10 g by mouth 2 (two) times daily.      Marland Kitchen  levothyroxine (SYNTHROID, LEVOTHROID) 75 MCG tablet Take 75 mcg by mouth daily.      . Multiple Vitamin (MULITIVITAMIN WITH MINERALS) TABS Take 1 tablet by mouth daily.      . nebivolol (BYSTOLIC) 10 MG tablet Take 20 mg by mouth daily.      . Omega-3 Fatty Acids (FISH OIL) 600 MG CAPS Take 1,200 mg by mouth daily.      Marland Kitchen omeprazole (PRILOSEC) 20 MG capsule Take 20 mg by mouth daily.       . Tamsulosin HCl (FLOMAX) 0.4 MG CAPS Take 0.4 mg by mouth daily.      Marland Kitchen warfarin (COUMADIN) 2.5 MG tablet Take 2.5-5 mg by mouth daily. Take 5 MG every day except Mondays; on Mondays, take 2.5 MG      . Wheat Dextrin (BENEFIBER PO) Take 1 packet by mouth 2 (two) times daily.      Marland Kitchen atorvastatin (LIPITOR) 10 MG tablet Take 10 mg by mouth daily.      . bethanechol (URECHOLINE) 25 MG tablet Take 25 mg by mouth 2 (two) times daily.      Marland Kitchen diltiazem (DILACOR XR) 120 MG 24 hr capsule Take 120 mg by mouth  daily.      Marland Kitchen trimethoprim (TRIMPEX) 100 MG tablet Take 100 mg by mouth daily.       No current facility-administered medications on file prior to visit.    ROS:  Out of a complete 14 system review of symptoms, the patient complains only of the following symptoms, and all other reviewed systems are negative.  Muscle spasm Burning sensations Clumsiness Gait disturbance Fatigue  There were no vitals taken for this visit.  Physical Exam  General: The patient is alert and cooperative at the time of the examination. The patient is markedly obese.  Skin: 1+ edema of ankles is noted bilaterally.   Neurologic Exam  Cranial nerves: Facial symmetry is present. Speech is normal, no aphasia or dysarthria is noted. Extraocular movements are full. Visual fields are full. Pupils are equal, round, and reactive to light. The upbeat nystagmus is seen on primary gaze bilaterally. Discs are flat bilaterally.  Motor: The patient has good strength in all 4 extremities, with the exception of mild bilateral proximal leg weakness..  Coordination: The patient has good finger-nose-finger and heel-to-shin bilaterally.  Gait and station: The gait was not tested. The patient remained in a wheelchair.  Reflexes: Deep tendon reflexes are symmetric, but were depressed in the legs.   Assessment/Plan:  One. Multiple sclerosis  2. Gait disorder  3. Spastic paraparesis  The patient has had the baclofen pump interrogated today, and it appears to be working well. The patient likely is undergoing a MS exacerbation. The patient will be placed on a prednisone taper, beginning at 60 mg daily, tapering by 10 mg every 2 days until she is off the medication. The patient is to take extra baclofen if needed, and she will take Xanax at night for sleep. She is to rest over the next several days. The patient will contact our office if she is not improving.  Marlan Palau MD 12/11/2012 7:04 PM  [facility] [address] [place], [place] [address]  Phone [phone] Fax [phone]

## 2012-12-20 DIAGNOSIS — R300 Dysuria: Secondary | ICD-10-CM | POA: Diagnosis not present

## 2012-12-20 DIAGNOSIS — N3000 Acute cystitis without hematuria: Secondary | ICD-10-CM | POA: Diagnosis not present

## 2012-12-23 ENCOUNTER — Telehealth: Payer: Self-pay | Admitting: Neurology

## 2012-12-23 DIAGNOSIS — R0989 Other specified symptoms and signs involving the circulatory and respiratory systems: Secondary | ICD-10-CM | POA: Diagnosis not present

## 2012-12-23 DIAGNOSIS — R609 Edema, unspecified: Secondary | ICD-10-CM | POA: Diagnosis not present

## 2012-12-23 DIAGNOSIS — I10 Essential (primary) hypertension: Secondary | ICD-10-CM | POA: Diagnosis not present

## 2012-12-23 DIAGNOSIS — G35 Multiple sclerosis: Secondary | ICD-10-CM | POA: Diagnosis not present

## 2012-12-23 DIAGNOSIS — R0609 Other forms of dyspnea: Secondary | ICD-10-CM | POA: Diagnosis not present

## 2012-12-23 DIAGNOSIS — Z6841 Body Mass Index (BMI) 40.0 and over, adult: Secondary | ICD-10-CM | POA: Diagnosis not present

## 2012-12-23 MED ORDER — CORTICOTROPIN 80 UNIT/ML IJ GEL: 80.0000 [IU] | Freq: Every day | INTRAMUSCULAR | Status: DC

## 2012-12-23 NOTE — Telephone Encounter (Signed)
I called and spoke with patient, who stated she has finished prednisone, but is having a lot of swelling around her abdominal area, low grade fever and  right side vision issues. Patient stated she doesn't want to take prednisone anymore.

## 2012-12-23 NOTE — Telephone Encounter (Signed)
I called the patient. The patient has completed the prednisone. The patient is now continued to have some burning in the feet and a tight feeling around the abdomen. The patient is having increased spasms. The patient has had blood work done through her primary care doctor, and a recent urinalysis through her urology doctor, without evidence of an infection. The patient indicates that she is no longer having low-grade fevers. The patient does not wish to go back on steroids, we may consider ACTH treatments, but this may not be covered through her insurance. I'll try to get this set up.

## 2012-12-25 DIAGNOSIS — E785 Hyperlipidemia, unspecified: Secondary | ICD-10-CM | POA: Diagnosis not present

## 2012-12-25 DIAGNOSIS — R609 Edema, unspecified: Secondary | ICD-10-CM | POA: Diagnosis not present

## 2012-12-25 DIAGNOSIS — Z6841 Body Mass Index (BMI) 40.0 and over, adult: Secondary | ICD-10-CM | POA: Diagnosis not present

## 2012-12-25 DIAGNOSIS — E039 Hypothyroidism, unspecified: Secondary | ICD-10-CM | POA: Diagnosis not present

## 2012-12-25 DIAGNOSIS — R109 Unspecified abdominal pain: Secondary | ICD-10-CM | POA: Diagnosis not present

## 2012-12-25 DIAGNOSIS — R509 Fever, unspecified: Secondary | ICD-10-CM | POA: Diagnosis not present

## 2012-12-25 DIAGNOSIS — G35 Multiple sclerosis: Secondary | ICD-10-CM | POA: Diagnosis not present

## 2012-12-27 DIAGNOSIS — K5900 Constipation, unspecified: Secondary | ICD-10-CM | POA: Diagnosis not present

## 2012-12-27 DIAGNOSIS — N83209 Unspecified ovarian cyst, unspecified side: Secondary | ICD-10-CM | POA: Diagnosis not present

## 2013-01-01 ENCOUNTER — Telehealth: Payer: Self-pay

## 2013-01-01 NOTE — Telephone Encounter (Signed)
Cheryl from [employer] called to advise [name] they have submitted the prior auth to ins for review.  If we have any questions, call back number is [phone]

## 2013-01-02 ENCOUNTER — Other Ambulatory Visit (HOSPITAL_COMMUNITY): Payer: Self-pay | Admitting: Gynecology

## 2013-01-02 DIAGNOSIS — N83201 Unspecified ovarian cyst, right side: Secondary | ICD-10-CM

## 2013-01-06 ENCOUNTER — Telehealth: Payer: Self-pay | Admitting: *Deleted

## 2013-01-06 NOTE — Telephone Encounter (Signed)
Patient called stating she's suppose to have a ultrasound of her Gallbladder this week. Patient wants to verify if she can have this ultrasound done because she remembered the surgeon who put the pump in mentioning something about ultrasounds.

## 2013-01-06 NOTE — Telephone Encounter (Signed)
I called patient. Not aware of any contraindications to using ultrasound with an indwelling lack of the pump. The gallbladder procedure will be a well way from the pump at any rate.

## 2013-01-07 ENCOUNTER — Telehealth: Payer: Self-pay | Admitting: Neurology

## 2013-01-07 NOTE — Telephone Encounter (Signed)
I called patient. The patient is trying to get assistance for payment of the ACTHAR, as her current copayment will be about $3000. She is supposed to hear something about the patient assistance within the next 48 hours. Until then, she cannot afford to take this medication as is.

## 2013-01-07 NOTE — Telephone Encounter (Signed)
I spoke with [employer] who stated that they tried to set up delivery of medicine, but the patient declined to set up a date of delivery.  They are calling to get some instructions from the office, on what they should do.  Please advise.   [phone].

## 2013-01-08 ENCOUNTER — Telehealth: Payer: Self-pay | Admitting: Neurology

## 2013-01-08 ENCOUNTER — Ambulatory Visit (HOSPITAL_COMMUNITY): Payer: Medicare Other

## 2013-01-08 NOTE — Telephone Encounter (Signed)
Duplicate message.  Dr Anne Hahn spoke with the patient yesterday.  She is following up with [employer] regarding Co-Pay Assistance.  She cannot afford medication without it.  Patient is waiting to hear back from [facility] regarding this.  I called them back.  Spoke with [employer].  She said they will contact patient about co-pay assist.

## 2013-01-08 NOTE — Telephone Encounter (Signed)
pt declined the shipment for medication [employer]. wants directions as to what to do next.- you can speak to anyone there.

## 2013-01-09 ENCOUNTER — Ambulatory Visit (INDEPENDENT_AMBULATORY_CARE_PROVIDER_SITE_OTHER): Payer: Medicare Other | Admitting: Pharmacist Clinician (PhC)/ Clinical Pharmacy Specialist

## 2013-01-09 VITALS — BP 144/86 | HR 72

## 2013-01-09 DIAGNOSIS — I4891 Unspecified atrial fibrillation: Secondary | ICD-10-CM

## 2013-01-09 DIAGNOSIS — Z7901 Long term (current) use of anticoagulants: Secondary | ICD-10-CM | POA: Diagnosis not present

## 2013-01-09 MED ORDER — FUROSEMIDE 40 MG PO TABS: 40.0000 mg | ORAL_TABLET | Freq: Two times a day (BID) | ORAL | Status: DC

## 2013-01-16 ENCOUNTER — Ambulatory Visit (HOSPITAL_COMMUNITY): Payer: Medicare Other

## 2013-01-23 ENCOUNTER — Ambulatory Visit (HOSPITAL_COMMUNITY): Payer: Medicare Other

## 2013-01-24 ENCOUNTER — Other Ambulatory Visit: Payer: Self-pay

## 2013-01-24 DIAGNOSIS — Z1231 Encounter for screening mammogram for malignant neoplasm of breast: Secondary | ICD-10-CM

## 2013-01-27 ENCOUNTER — Telehealth: Payer: Self-pay | Admitting: Neurology

## 2013-01-28 NOTE — Telephone Encounter (Signed)
I called pt to let her know got message.  Will forward to Dr. Anne Hahn.  Also did touch base with [facility] about referral for PT.  I refaxed this again.

## 2013-01-30 ENCOUNTER — Telehealth: Payer: Self-pay | Admitting: Neurology

## 2013-01-30 NOTE — Telephone Encounter (Signed)
Will send to Dr. Vickey Huger who is the pts provider.   Dr. Anne Hahn does her baclofen pump.  Please call pt re: acthar questions.   thanks

## 2013-01-31 ENCOUNTER — Other Ambulatory Visit: Payer: Self-pay | Admitting: Neurology

## 2013-01-31 DIAGNOSIS — I050 Rheumatic mitral stenosis: Secondary | ICD-10-CM

## 2013-01-31 MED ORDER — CORTICOTROPIN 80 UNIT/ML IJ GEL: 40.0000 [IU] | INTRAMUSCULAR | Status: DC

## 2013-01-31 NOTE — Telephone Encounter (Signed)
Dr. Anne Hahn started this patient on ACTHAR  and she just received the medication at home. 15 viles were f delivered, should she take these all in a row . ? I asked her to take the first 3 shots and monitor her B glucose and BP. If she is doing OK , continue with the rest, RV on 02-10-13 .

## 2013-02-03 DIAGNOSIS — I4891 Unspecified atrial fibrillation: Secondary | ICD-10-CM | POA: Diagnosis not present

## 2013-02-03 DIAGNOSIS — R269 Unspecified abnormalities of gait and mobility: Secondary | ICD-10-CM | POA: Diagnosis not present

## 2013-02-03 DIAGNOSIS — N319 Neuromuscular dysfunction of bladder, unspecified: Secondary | ICD-10-CM | POA: Diagnosis not present

## 2013-02-03 DIAGNOSIS — Z7901 Long term (current) use of anticoagulants: Secondary | ICD-10-CM | POA: Diagnosis not present

## 2013-02-03 DIAGNOSIS — IMO0001 Reserved for inherently not codable concepts without codable children: Secondary | ICD-10-CM | POA: Diagnosis not present

## 2013-02-03 DIAGNOSIS — G35 Multiple sclerosis: Secondary | ICD-10-CM | POA: Diagnosis not present

## 2013-02-05 DIAGNOSIS — N319 Neuromuscular dysfunction of bladder, unspecified: Secondary | ICD-10-CM | POA: Diagnosis not present

## 2013-02-05 DIAGNOSIS — Z7901 Long term (current) use of anticoagulants: Secondary | ICD-10-CM | POA: Diagnosis not present

## 2013-02-05 DIAGNOSIS — R269 Unspecified abnormalities of gait and mobility: Secondary | ICD-10-CM | POA: Diagnosis not present

## 2013-02-05 DIAGNOSIS — I4891 Unspecified atrial fibrillation: Secondary | ICD-10-CM | POA: Diagnosis not present

## 2013-02-05 DIAGNOSIS — G35 Multiple sclerosis: Secondary | ICD-10-CM | POA: Diagnosis not present

## 2013-02-05 DIAGNOSIS — IMO0001 Reserved for inherently not codable concepts without codable children: Secondary | ICD-10-CM | POA: Diagnosis not present

## 2013-02-07 ENCOUNTER — Ambulatory Visit (INDEPENDENT_AMBULATORY_CARE_PROVIDER_SITE_OTHER): Payer: Medicare Other | Admitting: Pharmacist Clinician (PhC)/ Clinical Pharmacy Specialist

## 2013-02-07 VITALS — BP 148/82 | HR 68

## 2013-02-07 DIAGNOSIS — IMO0001 Reserved for inherently not codable concepts without codable children: Secondary | ICD-10-CM | POA: Diagnosis not present

## 2013-02-07 DIAGNOSIS — I050 Rheumatic mitral stenosis: Secondary | ICD-10-CM | POA: Diagnosis not present

## 2013-02-07 DIAGNOSIS — N319 Neuromuscular dysfunction of bladder, unspecified: Secondary | ICD-10-CM | POA: Diagnosis not present

## 2013-02-07 DIAGNOSIS — Z7901 Long term (current) use of anticoagulants: Secondary | ICD-10-CM | POA: Diagnosis not present

## 2013-02-07 DIAGNOSIS — R269 Unspecified abnormalities of gait and mobility: Secondary | ICD-10-CM | POA: Diagnosis not present

## 2013-02-07 DIAGNOSIS — G35 Multiple sclerosis: Secondary | ICD-10-CM | POA: Diagnosis not present

## 2013-02-07 DIAGNOSIS — I4891 Unspecified atrial fibrillation: Secondary | ICD-10-CM | POA: Diagnosis not present

## 2013-02-07 LAB — POCT INR: INR: 2

## 2013-02-07 MED ORDER — CALCIUM CARBONATE-VIT D-MIN 600-800 MG-UNIT PO CHEW: 1.0000 | CHEWABLE_TABLET | Freq: Two times a day (BID) | ORAL | Status: DC

## 2013-02-10 DIAGNOSIS — I4891 Unspecified atrial fibrillation: Secondary | ICD-10-CM | POA: Diagnosis not present

## 2013-02-10 DIAGNOSIS — Z7901 Long term (current) use of anticoagulants: Secondary | ICD-10-CM | POA: Diagnosis not present

## 2013-02-10 DIAGNOSIS — R269 Unspecified abnormalities of gait and mobility: Secondary | ICD-10-CM | POA: Diagnosis not present

## 2013-02-10 DIAGNOSIS — IMO0001 Reserved for inherently not codable concepts without codable children: Secondary | ICD-10-CM | POA: Diagnosis not present

## 2013-02-10 DIAGNOSIS — N319 Neuromuscular dysfunction of bladder, unspecified: Secondary | ICD-10-CM | POA: Diagnosis not present

## 2013-02-10 DIAGNOSIS — G35 Multiple sclerosis: Secondary | ICD-10-CM | POA: Diagnosis not present

## 2013-02-12 ENCOUNTER — Encounter: Payer: Self-pay | Admitting: Neurology

## 2013-02-12 ENCOUNTER — Ambulatory Visit (INDEPENDENT_AMBULATORY_CARE_PROVIDER_SITE_OTHER): Payer: Medicare Other | Admitting: Neurology

## 2013-02-12 DIAGNOSIS — G4733 Obstructive sleep apnea (adult) (pediatric): Secondary | ICD-10-CM | POA: Diagnosis not present

## 2013-02-12 DIAGNOSIS — I4891 Unspecified atrial fibrillation: Secondary | ICD-10-CM | POA: Diagnosis not present

## 2013-02-12 DIAGNOSIS — G35 Multiple sclerosis: Secondary | ICD-10-CM

## 2013-02-12 DIAGNOSIS — M62838 Other muscle spasm: Secondary | ICD-10-CM | POA: Diagnosis not present

## 2013-02-12 NOTE — Progress Notes (Signed)
[facility]  Provider:  Dr Vickey Huger Referring Provider: Hoyle Sauer, MD Primary Care Physician:  Hoyle Sauer, MD  Chief Complaint  Patient presents with  . Follow-up    per EMR, rm 10    HPI:  Toni [name] is a 67 y.o. female here as a revisit  from Dr. Felipa Eth for  MS . 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 Noreene Filbert [phone])  with Rebif,  later changed under Dr. Oliva Bustard guidance to Betaseron and finally Tysabri .Tysabri was discontinued  2010,  since 2010 she has noticed a slow rather than stepwise decline but she seems to have had at least one MS relapse per anno , that she still differentiate from her otherwise progressive disease.  Due  to treatments with steroids pulse steroids as well as oral taper, she has gained a lot of weight and this  induced in addition sleep apnea.  She also has a neurogenic bladder and had often troubled his UTIs in the past. She is treated for her sleep apnea the CPAP and he was is this for about 6 hours nightly but download dated 02/06/2013 shows an average daily usage of 7 hours and 40 minutes at a pressure of 12 cm water and an apnea index of 8.7 she also has a rather high air leak. I would like for the patient is a high residual AHI of 21.8 to use an hour to titrate her between 6 and 15 cm water. I would generate the order today. [facility].   MS related truncal and abdominal  spasms , she has multiple brain lesions on her followup MRIs, but no spinal lesions. In 2009 she begun using a baclofen pump initially with wonderful results , but unfortunately adjustments and doors and pulse doses, has not  Been able reciprocated the same effect on her. Baclofen pump used to be followed by Dr. Sharene Skeans and is now followed by Dr. Anne Hahn. A baclofen pump needs to be be charged on  July 1 of this year  Toni [name] is concerned about the recent prescription of Acthar By Dr Janelle Floor , as she has experienced atrial fibrillation under Solu-Medrol pulse therapy in the past and has had high blood pressures as well as high blood sugars. The ID of the precursor hormone being used was that this would be less likely to cause hypertensive crisis or hyperglycemia. A 15 vial medication package  has been delivered to Toni [name] home but the nurses' Dr. has not been able to come by . Toni [name] also reports that the prescription brought for intramuscular injection and not subcutaneous as is normally used for ACTH. For just today to change her to subcutaneous pathway and have the patient started tomorrow. Use acthar 5 days in a row, than none for the rest of the month .     Betaseron was d/c.        Review of Systems: Out of a complete 14 system review, the patient complains of only the following symptoms, and all other reviewed systems are negative. EDS, morbid oebsity, a fib, fatigue. abd spasm, gait weakness.    History   Social History  . Marital Status: Married    Spouse Name: N/A    Number of Children: 2  . Years of Education: COLLEGE   Occupational History  . RETIRED  Social History Main Topics  . Smoking status: Former Games developer  . Smokeless tobacco: Not on file     Comment: QUIT 1978  . Alcohol Use: Yes     Comment: SOCIALLY  . Drug Use: No  . Sexually Active: Yes   Other Topics Concern  . Not on file   Social History Narrative   Lives in [place] with Izora Ribas [phone], [phone]          Family History  Problem Relation Age of Onset  . Coronary artery disease Father     at age 75  . Coronary artery disease Maternal Grandmother   . Depression Maternal Grandmother   . Cancer Paternal Grandmother   . Depression Mother     Past Medical History  Diagnosis Date  . Multiple sclerosis     has had this may 1989-[facility] reg doc  . Hypertension   .  Atrial fibrillation   . Thyroid disease   . HLD (hyperlipidemia)   . Shortness of breath   . Pneumonia   . Sleep apnea   . Hyperthyroidism   . Edema   . Depression   . Anxiety   . Headache(784.0)     MIGRAINE   . Chronic pain     Past Surgical History  Procedure Laterality Date  . Cervical fusion      with correction-june 2005  . Infusion pump implantation      baclofen infusion in lower abd  . Vein bypass surgery      radiofreq ablation 1 yr ago-Dr Lynnea Ferrier and [place]  . Back surgery    . Child births      X31    Current Outpatient Prescriptions  Medication Sig Dispense Refill  . ALPRAZolam (XANAX) 0.5 MG tablet Take 0.5 mg by mouth 2 (two) times daily as needed. For anxiety.      . baclofen (LIORESAL) 20 MG tablet Take 20 mg by mouth 2 (two) times daily. As needed      . buPROPion (WELLBUTRIN XL) 300 MG 24 hr tablet Take 300 mg by mouth daily.      . Calcium Carbonate-Vit D-Min 600-800 MG-UNIT CHEW Chew 1 tablet by mouth 2 (two) times daily.  60 tablet    . cephALEXin (KEFLEX) 250 MG capsule Take 250 mg by mouth at bedtime.      . cetirizine (ZYRTEC) 10 MG tablet Take 10 mg by mouth daily.      . corticotropin (ACTHAR HP) 80 UNIT/ML injectable gel Inject 0.5 mLs (40 Units total) into the muscle 1 day or 1 dose.  5 mL  0  . furosemide (LASIX) 40 MG tablet Take 1 tablet (40 mg total) by mouth 2 (two) times daily.  180 tablet  2  . gabapentin (NEURONTIN) 300 MG capsule Take 300 mg by mouth 3 (three) times daily. As needed      . lactulose (CHRONULAC) 10 GM/15ML solution Take 10 g by mouth 2 (two) times daily.      Marland Kitchen levothyroxine (SYNTHROID, LEVOTHROID) 75 MCG tablet Take 75 mcg by mouth daily.      . Multiple Vitamin (MULITIVITAMIN WITH MINERALS) TABS Take 1 tablet by mouth daily.      . Nebivolol HCl 20 MG TABS Take by mouth daily.      . Omega-3 Fatty Acids (FISH OIL) 600 MG CAPS Take 1,200 mg by mouth daily.      Marland Kitchen omeprazole (PRILOSEC) 20 MG capsule Take 20 mg by mouth  daily.       Marland Kitchen  ranitidine (ZANTAC) 150 MG tablet At bedtime      . simvastatin (ZOCOR) 20 MG tablet Take 20 mg by mouth every evening.      . Tamsulosin HCl (FLOMAX) 0.4 MG CAPS Take 0.4 mg by mouth daily.      . WARFARIN SODIUM PO Take 5 mg by mouth. Take 5mg  daily, except on Mondays.      . Wheat Dextrin (BENEFIBER PO) Take 1 packet by mouth 2 (two) times daily.       No current facility-administered medications for this visit.    Allergies as of 02/12/2013 - Review Complete 02/12/2013  Allergen Reaction Noted  . Chlorhexidine gluconate  11/26/2011  . Sertraline hcl  11/26/2011    Vitals: BP 133/76  Pulse 71  Temp(Src) 97 F (36.1 C) (Oral) Last Weight:  Wt Readings from Last 1 Encounters:  11/30/11 265 lb 4.8 oz (120.339 kg)   Last Height:   Ht Readings from Last 1 Encounters:  11/26/11 5\' 4"  (1.626 m)   Vision Screening:   Physical exam:  General: The patient is awake, alert and appears not in acute distress. The patient is well groomed. Head: Normocephalic, atraumatic. Neck is supple. Mallampati 4 , neck circumference: 19,  Cardiovascular:  Regular rate and rhythm without  murmurs or carotid bruit, and without distended neck veins. Respiratory: Lungs are clear to auscultation. Skin: edema,  rash Trunk: BMI is elevated and patient  has normal posture.  Neurologic exam : The patient is awake and alert, oriented to place and time.  Memory subjective  described as intact. There is a normal attention span & concentration ability. Speech  without  dysarthria, dysphonia , delayed wordfinding- aphasia.  Mood and affect are appropriate.  Cranial nerves: Pupils are equal and briskly reactive to light.  Extraocular movements  in vertical and horizontal planes intact and without nystagmus. Visual fields by finger perimetry are intact. Hearing to finger rub intact.  Facial sensation intact to fine touch. Facial motor strength is symmetric and tongue and uvula move  midline.  Motor exam:  Patient reports that she is losing dexterity in her right hand feels clumsy and also numbish. She reports a gloved feeling the , She reports trouble with spitting up fine objects like lint or a  pill. Decreased strength in all extremities.  Sensory:  Fine touch, pinprick and vibration were  restricted in the right, dominant   hand , both  feet,   Coordination: He reports changing in her handwriting and trouble holding a pencil in her right him a dominant hand. Rapid alternating movements in the fingers/hands is slow. Gait and station:wheelchair . Deep tendon reflexes: in the  upper and lower extremities are attenuated, right babinski positive.   Assessment:  After physical and neurologic examination, review of laboratory studies, imaging, neurophysiology testing and pre-existing records, assessment   MS - changed to  Acthar , has not started yet as she was issued  intramuscular  Needles, 15 shots.   Labs reviewed.   CPAP compliance reviewed, is high - but AHI is too high , needs an autotitrator with a new headgear.  Try Pillairo  Or P10 airfit.  Chin strap.  The patient has very variable blood pressures and his needing diuretics to control her fluid load. She feels swollen all over. Orthostatic hypotension. Urinary urge   abdominal -girdle spasms ( MS Hug) , improved by Baclofen.    Plan:  Treatment plan and additional workup will be reviewed under Problem List.

## 2013-02-13 DIAGNOSIS — G35 Multiple sclerosis: Secondary | ICD-10-CM | POA: Diagnosis not present

## 2013-02-13 DIAGNOSIS — N319 Neuromuscular dysfunction of bladder, unspecified: Secondary | ICD-10-CM | POA: Diagnosis not present

## 2013-02-13 DIAGNOSIS — R269 Unspecified abnormalities of gait and mobility: Secondary | ICD-10-CM | POA: Diagnosis not present

## 2013-02-13 DIAGNOSIS — I4891 Unspecified atrial fibrillation: Secondary | ICD-10-CM | POA: Diagnosis not present

## 2013-02-13 DIAGNOSIS — IMO0001 Reserved for inherently not codable concepts without codable children: Secondary | ICD-10-CM | POA: Diagnosis not present

## 2013-02-13 DIAGNOSIS — Z7901 Long term (current) use of anticoagulants: Secondary | ICD-10-CM | POA: Diagnosis not present

## 2013-02-14 DIAGNOSIS — IMO0001 Reserved for inherently not codable concepts without codable children: Secondary | ICD-10-CM | POA: Diagnosis not present

## 2013-02-14 DIAGNOSIS — N319 Neuromuscular dysfunction of bladder, unspecified: Secondary | ICD-10-CM | POA: Diagnosis not present

## 2013-02-14 DIAGNOSIS — I4891 Unspecified atrial fibrillation: Secondary | ICD-10-CM | POA: Diagnosis not present

## 2013-02-14 DIAGNOSIS — Z7901 Long term (current) use of anticoagulants: Secondary | ICD-10-CM | POA: Diagnosis not present

## 2013-02-14 DIAGNOSIS — G35 Multiple sclerosis: Secondary | ICD-10-CM | POA: Diagnosis not present

## 2013-02-14 DIAGNOSIS — R269 Unspecified abnormalities of gait and mobility: Secondary | ICD-10-CM | POA: Diagnosis not present

## 2013-02-17 ENCOUNTER — Telehealth: Payer: Self-pay | Admitting: Internal Medicine

## 2013-02-17 ENCOUNTER — Encounter: Payer: Self-pay | Admitting: Neurology

## 2013-02-17 ENCOUNTER — Ambulatory Visit: Payer: Medicare Other | Admitting: Internal Medicine

## 2013-02-17 DIAGNOSIS — IMO0001 Reserved for inherently not codable concepts without codable children: Secondary | ICD-10-CM | POA: Diagnosis not present

## 2013-02-17 DIAGNOSIS — Z7901 Long term (current) use of anticoagulants: Secondary | ICD-10-CM | POA: Diagnosis not present

## 2013-02-17 DIAGNOSIS — I4891 Unspecified atrial fibrillation: Secondary | ICD-10-CM | POA: Diagnosis not present

## 2013-02-17 DIAGNOSIS — G35 Multiple sclerosis: Secondary | ICD-10-CM | POA: Diagnosis not present

## 2013-02-17 DIAGNOSIS — R269 Unspecified abnormalities of gait and mobility: Secondary | ICD-10-CM | POA: Diagnosis not present

## 2013-02-17 DIAGNOSIS — N319 Neuromuscular dysfunction of bladder, unspecified: Secondary | ICD-10-CM | POA: Diagnosis not present

## 2013-02-17 NOTE — Telephone Encounter (Signed)
Dr. Rennis Golden notified and advised it's okay for pt reschedule appt.  Returned call and informed pt per instructions by MD/PA.  Pt verbalized understanding and agreed w/ plan.  Appt rescheduled for 7.8.14 at 10:45am w/ Dr. Rennis Golden.

## 2013-02-17 NOTE — Telephone Encounter (Signed)
Returned call.  Pt stated she has an appt w/ Dr. Rennis Golden for BP check and possible changing of meds.  Pt stated she had to start a new shot last Friday for her MS (acthar injections) and she gets the injections once daily for 5 days.  Stated she won't be done with them until Thursday.  Pt stated it has the same effect as a steroid and raises the BP.  Pt doesn't want to keep appt b/c the BPs will be inaccurate and "it will be a waste of his time."  Pt stated her PCP has told her to take an extra 1/2 pill of Bystolic and has given her samples.  Pt informed RN cannot make the decision to reschedule her appt and Dr. Rennis Golden will be notified for further instructions.  Pt verbalized understanding and agreed w/ plan.  Message forwarded to Dr. Rennis Golden.

## 2013-02-17 NOTE — Telephone Encounter (Signed)
She has an appointment today-need your opinion!

## 2013-02-18 ENCOUNTER — Encounter: Payer: Self-pay | Admitting: *Deleted

## 2013-02-18 ENCOUNTER — Ambulatory Visit (INDEPENDENT_AMBULATORY_CARE_PROVIDER_SITE_OTHER): Payer: Medicare Other | Admitting: Neurology

## 2013-02-18 VITALS — BP 133/68 | HR 77

## 2013-02-18 DIAGNOSIS — G35 Multiple sclerosis: Secondary | ICD-10-CM | POA: Diagnosis not present

## 2013-02-18 NOTE — Procedures (Signed)
  History:  Toni [name] is a 67 year old patient with a history of multiple sclerosis. The patient returns for a baclofen pump refill. The patient believes that she is having some increase in her spasticity, with some back spasms. The patient currently is on ACTH therapy.  Baclofen pump refill note  The baclofen pump site was cleaned with Betadine solution. A 21-gauge needle was inserted into the pump port site. Approximately 5 cc of residual baclofen was removed. 40 cc of replacement baclofen was placed into the pump at 2000 mcg/cc concentration.  The pump was reprogrammed for the following settings: Basal rate of 37.0 mcg per hour, increase from 34.0 micrograms per hour, with a reduction of the rate to 25.0 mcg per hour from 1 AM to 5 AM with a total of a 827.4 mcg per day.  The alarm volume is set at 1.5 cc. The next alarm date is 05/22/2013.  The patient tolerated the procedure well. There were no complications of the above procedure.

## 2013-02-20 ENCOUNTER — Encounter: Payer: Self-pay | Admitting: Internal Medicine

## 2013-02-21 DIAGNOSIS — G35 Multiple sclerosis: Secondary | ICD-10-CM | POA: Diagnosis not present

## 2013-02-21 DIAGNOSIS — Z7901 Long term (current) use of anticoagulants: Secondary | ICD-10-CM | POA: Diagnosis not present

## 2013-02-21 DIAGNOSIS — N319 Neuromuscular dysfunction of bladder, unspecified: Secondary | ICD-10-CM | POA: Diagnosis not present

## 2013-02-21 DIAGNOSIS — IMO0001 Reserved for inherently not codable concepts without codable children: Secondary | ICD-10-CM | POA: Diagnosis not present

## 2013-02-21 DIAGNOSIS — R269 Unspecified abnormalities of gait and mobility: Secondary | ICD-10-CM | POA: Diagnosis not present

## 2013-02-21 DIAGNOSIS — I4891 Unspecified atrial fibrillation: Secondary | ICD-10-CM | POA: Diagnosis not present

## 2013-02-23 ENCOUNTER — Encounter (HOSPITAL_COMMUNITY): Payer: Self-pay | Admitting: *Deleted

## 2013-02-23 ENCOUNTER — Emergency Department (HOSPITAL_COMMUNITY)
Admission: EM | Admit: 2013-02-23 | Discharge: 2013-02-23 | Disposition: A | Discharge status: Home / Self Care | Payer: Medicare Other | Attending: Emergency Medicine | Admitting: Emergency Medicine

## 2013-02-23 ENCOUNTER — Telehealth: Payer: Self-pay | Admitting: Neurology

## 2013-02-23 DIAGNOSIS — G8929 Other chronic pain: Secondary | ICD-10-CM | POA: Insufficient documentation

## 2013-02-23 DIAGNOSIS — G35 Multiple sclerosis: Secondary | ICD-10-CM | POA: Insufficient documentation

## 2013-02-23 DIAGNOSIS — Z872 Personal history of diseases of the skin and subcutaneous tissue: Secondary | ICD-10-CM | POA: Insufficient documentation

## 2013-02-23 DIAGNOSIS — I4891 Unspecified atrial fibrillation: Secondary | ICD-10-CM | POA: Diagnosis not present

## 2013-02-23 DIAGNOSIS — F329 Major depressive disorder, single episode, unspecified: Secondary | ICD-10-CM | POA: Insufficient documentation

## 2013-02-23 DIAGNOSIS — Z981 Arthrodesis status: Secondary | ICD-10-CM | POA: Insufficient documentation

## 2013-02-23 DIAGNOSIS — Z79899 Other long term (current) drug therapy: Secondary | ICD-10-CM | POA: Insufficient documentation

## 2013-02-23 DIAGNOSIS — R6889 Other general symptoms and signs: Secondary | ICD-10-CM | POA: Diagnosis not present

## 2013-02-23 DIAGNOSIS — E0590 Thyrotoxicosis, unspecified without thyrotoxic crisis or storm: Secondary | ICD-10-CM | POA: Diagnosis not present

## 2013-02-23 DIAGNOSIS — Z9889 Other specified postprocedural states: Secondary | ICD-10-CM | POA: Insufficient documentation

## 2013-02-23 DIAGNOSIS — E079 Disorder of thyroid, unspecified: Secondary | ICD-10-CM | POA: Diagnosis not present

## 2013-02-23 DIAGNOSIS — I10 Essential (primary) hypertension: Secondary | ICD-10-CM | POA: Diagnosis not present

## 2013-02-23 DIAGNOSIS — Z87891 Personal history of nicotine dependence: Secondary | ICD-10-CM | POA: Insufficient documentation

## 2013-02-23 DIAGNOSIS — E785 Hyperlipidemia, unspecified: Secondary | ICD-10-CM | POA: Insufficient documentation

## 2013-02-23 DIAGNOSIS — Z8701 Personal history of pneumonia (recurrent): Secondary | ICD-10-CM | POA: Insufficient documentation

## 2013-02-23 DIAGNOSIS — G4730 Sleep apnea, unspecified: Secondary | ICD-10-CM | POA: Insufficient documentation

## 2013-02-23 DIAGNOSIS — F411 Generalized anxiety disorder: Secondary | ICD-10-CM | POA: Diagnosis not present

## 2013-02-23 DIAGNOSIS — R1084 Generalized abdominal pain: Secondary | ICD-10-CM | POA: Diagnosis not present

## 2013-02-23 DIAGNOSIS — M545 Low back pain, unspecified: Secondary | ICD-10-CM | POA: Insufficient documentation

## 2013-02-23 DIAGNOSIS — R109 Unspecified abdominal pain: Secondary | ICD-10-CM | POA: Diagnosis not present

## 2013-02-23 DIAGNOSIS — Z9861 Coronary angioplasty status: Secondary | ICD-10-CM | POA: Insufficient documentation

## 2013-02-23 DIAGNOSIS — G43909 Migraine, unspecified, not intractable, without status migrainosus: Secondary | ICD-10-CM | POA: Insufficient documentation

## 2013-02-23 DIAGNOSIS — Z8679 Personal history of other diseases of the circulatory system: Secondary | ICD-10-CM | POA: Diagnosis not present

## 2013-02-23 DIAGNOSIS — F3289 Other specified depressive episodes: Secondary | ICD-10-CM | POA: Insufficient documentation

## 2013-02-23 DIAGNOSIS — M549 Dorsalgia, unspecified: Secondary | ICD-10-CM

## 2013-02-23 LAB — CBC WITH DIFFERENTIAL/PLATELET
Eosinophils Relative: 1 % (ref 0–5)
HCT: 43.7 % (ref 36.0–46.0)
Lymphocytes Relative: 28 % (ref 12–46)
Lymphs Abs: 1.8 10*3/uL (ref 0.7–4.0)
MCV: 90.7 fL (ref 78.0–100.0)
Monocytes Absolute: 0.6 10*3/uL (ref 0.1–1.0)
Monocytes Relative: 9 % (ref 3–12)
RBC: 4.82 MIL/uL (ref 3.87–5.11)
WBC: 6.6 10*3/uL (ref 4.0–10.5)

## 2013-02-23 LAB — COMPREHENSIVE METABOLIC PANEL
ALT: 24 U/L (ref 0–35)
BUN: 18 mg/dL (ref 6–23)
CO2: 32 mEq/L (ref 19–32)
Calcium: 8.5 mg/dL (ref 8.4–10.5)
Creatinine, Ser: 0.73 mg/dL (ref 0.50–1.10)
GFR calc Af Amer: >90 mL/min (ref >90)
GFR calc non Af Amer: 88 mL/min — ABNORMAL LOW (ref >90)
Glucose, Bld: 121 mg/dL — ABNORMAL HIGH (ref 70–99)

## 2013-02-23 LAB — URINALYSIS, ROUTINE W REFLEX MICROSCOPIC
Hgb urine dipstick: NEGATIVE
Protein, ur: NEGATIVE mg/dL
Urobilinogen, UA: 0.2 mg/dL (ref 0.0–1.0)

## 2013-02-23 LAB — POCT I-STAT TROPONIN I: Troponin i, poc: 0 ng/mL (ref 0.00–0.08)

## 2013-02-23 MED ORDER — LORAZEPAM 2 MG/ML IJ SOLN
2.0000 mg | Freq: Once | INTRAMUSCULAR | Status: AC
  Administered 2013-02-23: 2 mg via INTRAVENOUS
  Filled 2013-02-23: qty 1

## 2013-02-23 MED ORDER — HYDROMORPHONE HCL PF 1 MG/ML IJ SOLN
1.0000 mg | Freq: Once | INTRAMUSCULAR | Status: AC
  Administered 2013-02-23: 1 mg via INTRAVENOUS
  Filled 2013-02-23: qty 1

## 2013-02-23 MED ORDER — OXYCODONE-ACETAMINOPHEN 5-325 MG PO TABS: 2.0000 | ORAL_TABLET | Freq: Four times a day (QID) | ORAL | Status: DC | PRN

## 2013-02-23 MED ORDER — DIAZEPAM 5 MG PO TABS: 5.0000 mg | ORAL_TABLET | Freq: Four times a day (QID) | ORAL | Status: DC | PRN

## 2013-02-23 MED ORDER — LORAZEPAM 2 MG/ML IJ SOLN
1.0000 mg | Freq: Once | INTRAMUSCULAR | Status: AC
  Administered 2013-02-23: 1 mg via INTRAVENOUS
  Filled 2013-02-23: qty 1

## 2013-02-23 NOTE — ED Provider Notes (Signed)
History    CSN: [id number] Arrival date & time 02/23/13  1030  First MD Initiated Contact with Patient 02/23/13 1048     Chief Complaint  Patient presents with  . Abdominal Pain  . Back Pain   (Consider location/radiation/quality/duration/timing/severity/associated sxs/prior Treatment) HPI This 67 year old female with several hours of diffuse abdominal pain and low back pain different than her usual chronic severe diffuse abdominal pain and low back pain, she feels as if she has a tight cramping and a possible muscle spasms around her abdomen and low back different than her usual severe pain, she is constant pain worse with any position change does not feel like she considered to go to the bathroom due to this pain, she is no chest pain cough shortness of breath fever nausea vomiting diarrhea or dysuria or hematuria no extremity pains no new focal weakness or numbness is not sure if this pain is an exacerbation of her multiple sclerosis or from a urine infection or folliculitis balance or other cause for she came to the ED for evaluation, her pain is sharp stabbing spasming severe and not localized it is worse with palpation and any slight movement. Past Medical History  Diagnosis Date  . Multiple sclerosis     has had this may 1989-[facility] reg doc  . Hypertension   . PAF (paroxysmal atrial fibrillation)     on coumadin; documented on monitor 06/2010  . Thyroid disease   . HLD (hyperlipidemia)   . Shortness of breath   . Pneumonia   . Sleep apnea   . Hyperthyroidism   . Edema     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' ablation of R GSV 2012  . Depression   . Anxiety   . Headache(784.0)     MIGRAINE   . Chronic pain   . Hx of echocardiogram 04/22/10    EF 55-60%, no valve issues  . History of stress test 06/21/10    limited exam with some degree of breast attenuatin of the apex however a component of apical ischemia is not excluded, EF 62%; cardiac cath    Past  Surgical History  Procedure Laterality Date  . Cervical fusion      with correction-june 2005  . Infusion pump implantation      baclofen infusion in lower abd  . Venous ablation  12/16/10    radiofreq ablation -Dr Lynnea Ferrier and [facility]  . Back surgery    . Child births      X61  . Cardiac catheterization  06/22/10    normal coronary arteries, PAF   Family History  Problem Relation Age of Onset  . Coronary artery disease Father     at age 56  . Coronary artery disease Maternal Grandmother   . Depression Maternal Grandmother   . Cancer Paternal Grandmother   . Depression Mother    History  Substance Use Topics  . Smoking status: Former Games developer  . Smokeless tobacco: Not on file     Comment: QUIT 1978  . Alcohol Use: Yes     Comment: SOCIALLY   OB History   Grav Para Term Preterm Abortions TAB SAB Ect Mult Living                 Review of Systems 10 Systems reviewed and are negative for acute change except as noted in the HPI. Allergies  Chlorhexidine gluconate and Sertraline hcl  Home Medications   Current Outpatient Rx  Name  Route  Sig  Dispense  Refill  . ALPRAZolam (XANAX) 0.5 MG tablet   Oral   Take 0.5 mg by mouth 2 (two) times daily as needed. For anxiety.         . baclofen (LIORESAL) 20 MG tablet   Oral   Take 20 mg by mouth 2 (two) times daily. As needed         . buPROPion (WELLBUTRIN XL) 300 MG 24 hr tablet   Oral   Take 300 mg by mouth daily.         . Calcium Carbonate-Vit D-Min 600-800 MG-UNIT CHEW   Oral   Chew 1 tablet by mouth 2 (two) times daily.   60 tablet      . cephALEXin (KEFLEX) 250 MG capsule   Oral   Take 250 mg by mouth at bedtime.         . cetirizine (ZYRTEC) 10 MG tablet   Oral   Take 10 mg by mouth daily.         . cholecalciferol (VITAMIN D) 1000 UNITS tablet   Oral   Take 1,000 Units by mouth daily.         . corticotropin (ACTHAR HP) 80 UNIT/ML injectable gel   Intramuscular   Inject 0.5 mLs (40 Units  total) into the muscle 1 day or 1 dose.   5 mL   0   . furosemide (LASIX) 40 MG tablet   Oral   Take 1 tablet (40 mg total) by mouth 2 (two) times daily.   180 tablet   2   . gabapentin (NEURONTIN) 300 MG capsule   Oral   Take 600 mg by mouth 3 (three) times daily. As needed         . lactulose (CHRONULAC) 10 GM/15ML solution   Oral   Take 10 g by mouth 2 (two) times daily.         Marland Kitchen levothyroxine (SYNTHROID, LEVOTHROID) 75 MCG tablet   Oral   Take 75 mcg by mouth daily.         . Multiple Vitamin (MULITIVITAMIN WITH MINERALS) TABS   Oral   Take 1 tablet by mouth daily.         . Nebivolol HCl 20 MG TABS   Oral   Take by mouth daily.         . Omega-3 Fatty Acids (FISH OIL) 600 MG CAPS   Oral   Take 1,200 mg by mouth daily.         Marland Kitchen omeprazole (PRILOSEC) 20 MG capsule   Oral   Take 20 mg by mouth daily.          . ranitidine (ZANTAC) 150 MG tablet      At bedtime         . simvastatin (ZOCOR) 20 MG tablet   Oral   Take 20 mg by mouth at bedtime.          . Tamsulosin HCl (FLOMAX) 0.4 MG CAPS   Oral   Take 0.4 mg by mouth daily.         Barron Alvine SODIUM PO   Oral   Take 5 mg by mouth. Take 5mg  daily, except on Mondays.         . Wheat Dextrin (BENEFIBER PO)   Oral   Take 1 packet by mouth 2 (two) times daily.         . diazepam (VALIUM) 5 MG tablet   Oral  Take 1 tablet (5 mg total) by mouth every 6 (six) hours as needed for anxiety (spasms).   10 tablet   0   . oxyCODONE-acetaminophen (PERCOCET) 5-325 MG per tablet   Oral   Take 2 tablets by mouth every 6 (six) hours as needed for pain.   10 tablet   0    BP 130/75  Temp(Src) 98.7 F (37.1 C) (Oral)  Resp 16  SpO2 98% Physical Exam  Nursing note and vitals reviewed. Constitutional:  Awake, alert, nontoxic obese appearance.  HENT:  Head: Atraumatic.  Eyes: Right eye exhibits no discharge. Left eye exhibits no discharge.  Neck: Neck supple.  Cardiovascular:  Normal rate and regular rhythm.   No murmur heard. Pulmonary/Chest: Effort normal and breath sounds normal. No respiratory distress. She has no wheezes. She has no rales. She exhibits no tenderness.  Abdominal: Soft. Bowel sounds are normal. She exhibits no distension and no mass. There is tenderness. There is no rebound and no guarding.  Mild diffuse abdominal tenderness without rebound  Musculoskeletal: She exhibits tenderness. She exhibits no edema.  Baseline ROM, no obvious new focal weakness. Cervical spine nontender upper back nontender but she has diffuse lumbar and paralumbar low back tenderness to minimal palpation worse with minimal torso movement.  Neurological: She is alert.  Mental status and motor strength appears baseline for patient and situation.  Skin: No rash noted.  Psychiatric: She has a normal mood and affect.    ED Course  Procedures (including critical care time) ECG: Sinus rhythm, rate 76, normal axis, slightly inverted T wave in lead V2, compared with March 2014 slight T-wave change in lead V2 otherwise no significant changes noted  Pt feels improved after observation and/or treatment in ED.Patient / Family / Caregiver informed of clinical course, understand medical decision-making process, and agree with plan. Labs Reviewed  COMPREHENSIVE METABOLIC PANEL - Abnormal; Notable for the following:    Glucose, Bld 121 (*)    Total Bilirubin 0.1 (*)    GFR calc non Af Amer 88 (*)    All other components within normal limits  CBC WITH DIFFERENTIAL  LIPASE, BLOOD  URINALYSIS, ROUTINE W REFLEX MICROSCOPIC  POCT I-STAT TROPONIN I   No results found. 1. Abdominal pain   2. Back pain     MDM  I doubt any other EMC precluding discharge at this time including, but not necessarily limited to the following:peritonitis, SBI.  Hurman Horn, MD 02/23/13 2156

## 2013-02-23 NOTE — Discharge Instructions (Signed)
Abdominal (belly) pain can be caused by many things. Your caregiver performed an examination and possibly ordered blood/urine tests and imaging (CT scan, x-rays, ultrasound). Many cases can be observed and treated at home after initial evaluation in the emergency department. Even though you are being discharged home, abdominal pain can be unpredictable. Therefore, you need a repeated exam if your pain does not resolve, returns, or worsens. Most patients with abdominal pain don't have to be admitted to the hospital or have surgery, but serious problems like appendicitis and gallbladder attacks can start out as nonspecific pain. Many abdominal conditions cannot be diagnosed in one visit, so follow-up evaluations are very important. °SEEK IMMEDIATE MEDICAL ATTENTION IF: °The pain does not go away or becomes severe.  °A temperature above 101 develops.  °Repeated vomiting occurs (multiple episodes).  °The pain becomes localized to portions of the abdomen. The right side could possibly be appendicitis. In an adult, the left lower portion of the abdomen could be colitis or diverticulitis.  °Blood is being passed in stools or vomit (bright red or black tarry stools).  °Return also if you develop chest pain, difficulty breathing, dizziness or fainting, or become confused, poorly responsive, or inconsolable (young children). ° °SEEK IMMEDIATE MEDICAL ATTENTION IF: °New numbness, tingling, weakness, or problem with the use of your arms or legs.  °Severe back pain not relieved with medications.  °Change in bowel or bladder control.  °Increasing pain in any areas of the body (such as chest or abdominal pain).  °Shortness of breath, dizziness or fainting.  °Nausea (feeling sick to your stomach), vomiting, fever, or sweats. °

## 2013-02-23 NOTE — Telephone Encounter (Signed)
The patient called. She has been having muscle spasms over the last several days that are quite severe. She will go to the er for eval. ? UTI. If no cause is found, she is to come to the office tomorrow to get the baclofen pump checked.

## 2013-02-23 NOTE — ED Notes (Signed)
Pt arrived by [facility]. Reports having stage 2 MS and has been recently started on new steroid. Now having severe cramping pain to abd and around to entire back, increases with movement.

## 2013-02-24 ENCOUNTER — Ambulatory Visit (INDEPENDENT_AMBULATORY_CARE_PROVIDER_SITE_OTHER): Payer: Medicare Other | Admitting: Pharmacist Clinician (PhC)/ Clinical Pharmacy Specialist

## 2013-02-24 ENCOUNTER — Encounter: Payer: Self-pay | Admitting: Neurology

## 2013-02-24 ENCOUNTER — Telehealth: Payer: Self-pay | Admitting: Pharmacist Clinician (PhC)/ Clinical Pharmacy Specialist

## 2013-02-24 ENCOUNTER — Ambulatory Visit (INDEPENDENT_AMBULATORY_CARE_PROVIDER_SITE_OTHER): Payer: Medicare Other | Admitting: Neurology

## 2013-02-24 VITALS — BP 128/82 | HR 72

## 2013-02-24 DIAGNOSIS — G35 Multiple sclerosis: Secondary | ICD-10-CM | POA: Diagnosis not present

## 2013-02-24 DIAGNOSIS — Z7901 Long term (current) use of anticoagulants: Secondary | ICD-10-CM

## 2013-02-24 DIAGNOSIS — M62838 Other muscle spasm: Secondary | ICD-10-CM | POA: Insufficient documentation

## 2013-02-24 DIAGNOSIS — I4891 Unspecified atrial fibrillation: Secondary | ICD-10-CM

## 2013-02-24 MED ORDER — DIAZEPAM 5 MG PO TABS: 5.0000 mg | ORAL_TABLET | Freq: Three times a day (TID) | ORAL | Status: DC | PRN

## 2013-02-24 NOTE — Telephone Encounter (Signed)
Spoke w/ pt today, she had severe muscle spasms, nausea and went to ER last night.  Afraid her INR may be off kilter due to ActharHP injection.  Would like to come by for INR check today if able.  Booked appt for 3:15, prior to her appt with Dr. Anne Hahn.  Advised her that if she doesn't feel up to coming, she can ask Dr. Anne Hahn to do a blood draw.

## 2013-02-24 NOTE — Telephone Encounter (Signed)
Pt would like for you to call her back. °

## 2013-02-24 NOTE — Progress Notes (Signed)
Toni [name] is a 67 year old patient with a history of multiple sclerosis. The patient was in the office last week for a baclofen pump refill. The basal rate was increased from 34 mcg an hour to 37 mcg an hour. The patient initially did quite well, but around 3 AM on 02/23/2013, the patient began having severe abdominal spasms. The spasms were around her stomach and back. The patient is already on ACTH injections. The patient went to the emergency room, and blood work and urine studies were done. These studies were unremarkable. The patient was given a prescription for oxycodone, but this caused nausea. Diazepam was also given at a 5 mg dose. The patient comes back to the office to re-evaluate her baclofen pump.  The pump was interrogated, and the settings were confirmed, the patient has had a basal rate of 37 mcg per hour. The pump has not reset.  The patient denies any falls or injury to the pump site area.  Plan is at this time to watch conservatively he another prescription was given for diazepam to be added to the baclofen. If the patient does not improve over the next several days, she will come back to the office, and we will access the pump port to confirm that baclofen is present in the pump. If no problems are noted with this, an x-ray of the pump will be done to ensure continuity of the tubing.

## 2013-02-25 ENCOUNTER — Ambulatory Visit: Payer: Medicare Other | Admitting: Internal Medicine

## 2013-02-25 ENCOUNTER — Encounter: Payer: Self-pay | Admitting: Neurology

## 2013-02-27 ENCOUNTER — Other Ambulatory Visit: Payer: Self-pay | Admitting: Neurology

## 2013-02-27 ENCOUNTER — Telehealth: Payer: Self-pay | Admitting: Neurology

## 2013-02-27 DIAGNOSIS — E271 Primary adrenocortical insufficiency: Secondary | ICD-10-CM

## 2013-02-27 DIAGNOSIS — G35 Multiple sclerosis: Secondary | ICD-10-CM

## 2013-02-27 NOTE — Telephone Encounter (Signed)
Would like to leave a message for Dr. Anne Hahn the Valium is working and if she doesn't take it she has the spasms. Need to take it about every 8hrs. Message for Dr. Vickey Huger told her when she was here that she was ordering some therapy for her and she has not heard anything from them so [facility] and [facility] have not received the referral. She also is waiting for a new order for a CPAP machine as well. She would prefer to [facility] for CPAP if that is ok.

## 2013-02-27 NOTE — Telephone Encounter (Signed)
Report from the patient is noted. If she needs to come back in office again, she is to let you know. I did not called patient.

## 2013-03-03 ENCOUNTER — Telehealth: Payer: Self-pay | Admitting: Neurology

## 2013-03-03 ENCOUNTER — Other Ambulatory Visit: Payer: Self-pay | Admitting: *Deleted

## 2013-03-03 DIAGNOSIS — G4733 Obstructive sleep apnea (adult) (pediatric): Secondary | ICD-10-CM

## 2013-03-03 DIAGNOSIS — E662 Morbid (severe) obesity with alveolar hypoventilation: Secondary | ICD-10-CM

## 2013-03-03 NOTE — Telephone Encounter (Signed)
I had treid PAP through [facility] since her home health is coming from there, a but if she wants to stay with [facility] will continue with them.  Her home PT and OT is ordered.   Valium is working - ?  I did not prescribe that.

## 2013-03-04 ENCOUNTER — Encounter: Payer: Self-pay | Admitting: Neurology

## 2013-03-04 ENCOUNTER — Telehealth: Payer: Self-pay | Admitting: Neurology

## 2013-03-04 ENCOUNTER — Ambulatory Visit (INDEPENDENT_AMBULATORY_CARE_PROVIDER_SITE_OTHER): Payer: Medicare Other | Admitting: Neurology

## 2013-03-04 DIAGNOSIS — Z7901 Long term (current) use of anticoagulants: Secondary | ICD-10-CM | POA: Diagnosis not present

## 2013-03-04 DIAGNOSIS — R269 Unspecified abnormalities of gait and mobility: Secondary | ICD-10-CM | POA: Diagnosis not present

## 2013-03-04 DIAGNOSIS — G35 Multiple sclerosis: Secondary | ICD-10-CM

## 2013-03-04 DIAGNOSIS — N319 Neuromuscular dysfunction of bladder, unspecified: Secondary | ICD-10-CM | POA: Diagnosis not present

## 2013-03-04 DIAGNOSIS — G35D Multiple sclerosis, unspecified: Secondary | ICD-10-CM | POA: Diagnosis not present

## 2013-03-04 DIAGNOSIS — I4891 Unspecified atrial fibrillation: Secondary | ICD-10-CM | POA: Diagnosis not present

## 2013-03-04 DIAGNOSIS — IMO0001 Reserved for inherently not codable concepts without codable children: Secondary | ICD-10-CM | POA: Diagnosis not present

## 2013-03-04 NOTE — Progress Notes (Signed)
Toni [name] comes in today indicating that the motor tone in the legs has decreased significantly following the last baclofen pump readjustment. The patient believes that the dosing is too high.  The baclofen pump was interrogated, and the basal rate was reduced from 37.0 mcg per hour to 34.0 mcg per hour. Between the hours of 1 AM and 5 AM, the rate goes to 25 mcg per hour. The total daily dose has been dropped from 827.4 mcg per day to 770.9 mcg per day. The patient will contact our office if further problems arise.

## 2013-03-04 NOTE — Telephone Encounter (Signed)
I called and spoke with the patient to inform her that Dr. Anne Hahn will see her at 4:30 today.

## 2013-03-06 ENCOUNTER — Telehealth: Payer: Self-pay | Admitting: Neurology

## 2013-03-06 ENCOUNTER — Other Ambulatory Visit (HOSPITAL_COMMUNITY): Payer: Self-pay | Admitting: Gynecology

## 2013-03-06 ENCOUNTER — Ambulatory Visit: Payer: Medicare Other

## 2013-03-06 DIAGNOSIS — N319 Neuromuscular dysfunction of bladder, unspecified: Secondary | ICD-10-CM | POA: Diagnosis not present

## 2013-03-06 DIAGNOSIS — R269 Unspecified abnormalities of gait and mobility: Secondary | ICD-10-CM | POA: Diagnosis not present

## 2013-03-06 DIAGNOSIS — I4891 Unspecified atrial fibrillation: Secondary | ICD-10-CM | POA: Diagnosis not present

## 2013-03-06 DIAGNOSIS — N83201 Unspecified ovarian cyst, right side: Secondary | ICD-10-CM

## 2013-03-06 DIAGNOSIS — G35 Multiple sclerosis: Secondary | ICD-10-CM | POA: Diagnosis not present

## 2013-03-06 DIAGNOSIS — IMO0001 Reserved for inherently not codable concepts without codable children: Secondary | ICD-10-CM | POA: Diagnosis not present

## 2013-03-06 DIAGNOSIS — Z7901 Long term (current) use of anticoagulants: Secondary | ICD-10-CM | POA: Diagnosis not present

## 2013-03-06 NOTE — Telephone Encounter (Signed)
re faxed referral to [facility].

## 2013-03-07 ENCOUNTER — Ambulatory Visit (INDEPENDENT_AMBULATORY_CARE_PROVIDER_SITE_OTHER): Payer: Medicare Other | Admitting: Pharmacist Clinician (PhC)/ Clinical Pharmacy Specialist

## 2013-03-07 ENCOUNTER — Ambulatory Visit
Admission: RE | Admit: 2013-03-07 | Discharge: 2013-03-07 | Disposition: A | Discharge status: Home / Self Care | Payer: Medicare Other | Source: Ambulatory Visit

## 2013-03-07 ENCOUNTER — Ambulatory Visit (HOSPITAL_COMMUNITY)
Admission: RE | Admit: 2013-03-07 | Discharge: 2013-03-07 | Disposition: A | Discharge status: Home / Self Care | Payer: Medicare Other | Source: Ambulatory Visit | Attending: Gynecology | Admitting: Gynecology

## 2013-03-07 VITALS — BP 122/70 | HR 68

## 2013-03-07 DIAGNOSIS — Z1231 Encounter for screening mammogram for malignant neoplasm of breast: Secondary | ICD-10-CM

## 2013-03-07 DIAGNOSIS — Z7901 Long term (current) use of anticoagulants: Secondary | ICD-10-CM

## 2013-03-07 DIAGNOSIS — D251 Intramural leiomyoma of uterus: Secondary | ICD-10-CM | POA: Diagnosis not present

## 2013-03-07 DIAGNOSIS — D259 Leiomyoma of uterus, unspecified: Secondary | ICD-10-CM | POA: Diagnosis not present

## 2013-03-07 DIAGNOSIS — I4891 Unspecified atrial fibrillation: Secondary | ICD-10-CM

## 2013-03-07 DIAGNOSIS — N83209 Unspecified ovarian cyst, unspecified side: Secondary | ICD-10-CM | POA: Diagnosis not present

## 2013-03-07 DIAGNOSIS — Z780 Asymptomatic menopausal state: Secondary | ICD-10-CM | POA: Diagnosis not present

## 2013-03-07 DIAGNOSIS — N83201 Unspecified ovarian cyst, right side: Secondary | ICD-10-CM

## 2013-03-10 DIAGNOSIS — Z7901 Long term (current) use of anticoagulants: Secondary | ICD-10-CM | POA: Diagnosis not present

## 2013-03-10 DIAGNOSIS — G35 Multiple sclerosis: Secondary | ICD-10-CM | POA: Diagnosis not present

## 2013-03-10 DIAGNOSIS — N319 Neuromuscular dysfunction of bladder, unspecified: Secondary | ICD-10-CM | POA: Diagnosis not present

## 2013-03-10 DIAGNOSIS — IMO0001 Reserved for inherently not codable concepts without codable children: Secondary | ICD-10-CM | POA: Diagnosis not present

## 2013-03-10 DIAGNOSIS — R269 Unspecified abnormalities of gait and mobility: Secondary | ICD-10-CM | POA: Diagnosis not present

## 2013-03-10 DIAGNOSIS — I4891 Unspecified atrial fibrillation: Secondary | ICD-10-CM | POA: Diagnosis not present

## 2013-03-11 ENCOUNTER — Other Ambulatory Visit: Payer: Self-pay | Admitting: Neurology

## 2013-03-11 ENCOUNTER — Telehealth: Payer: Self-pay | Admitting: Neurology

## 2013-03-11 DIAGNOSIS — N312 Flaccid neuropathic bladder, not elsewhere classified: Secondary | ICD-10-CM | POA: Diagnosis not present

## 2013-03-11 DIAGNOSIS — G35 Multiple sclerosis: Secondary | ICD-10-CM

## 2013-03-11 NOTE — Telephone Encounter (Signed)
Maurine Minister the PT with [facility] is asking for a verbal order for speech therapy and OT.

## 2013-03-12 ENCOUNTER — Encounter: Payer: Self-pay | Admitting: Neurology

## 2013-03-12 ENCOUNTER — Telehealth: Payer: Self-pay | Admitting: Neurology

## 2013-03-12 DIAGNOSIS — I4891 Unspecified atrial fibrillation: Secondary | ICD-10-CM | POA: Diagnosis not present

## 2013-03-12 DIAGNOSIS — G35 Multiple sclerosis: Secondary | ICD-10-CM

## 2013-03-12 DIAGNOSIS — N319 Neuromuscular dysfunction of bladder, unspecified: Secondary | ICD-10-CM | POA: Diagnosis not present

## 2013-03-12 DIAGNOSIS — R269 Unspecified abnormalities of gait and mobility: Secondary | ICD-10-CM | POA: Diagnosis not present

## 2013-03-12 DIAGNOSIS — IMO0001 Reserved for inherently not codable concepts without codable children: Secondary | ICD-10-CM | POA: Diagnosis not present

## 2013-03-12 DIAGNOSIS — Z7901 Long term (current) use of anticoagulants: Secondary | ICD-10-CM | POA: Diagnosis not present

## 2013-03-12 MED ORDER — INTERFERON BETA-1B 0.3 MG ~~LOC~~ KIT: 0.2500 mg | PACK | SUBCUTANEOUS | Status: DC

## 2013-03-12 NOTE — Telephone Encounter (Signed)
Patient wants to see if it's ok to use betaseron injection and Acthar injections together.  She wants to start back on the betaseron, she can feel the difference now that she is off it.

## 2013-03-12 NOTE — Telephone Encounter (Signed)
May use acthar and betaseron together.

## 2013-03-13 ENCOUNTER — Telehealth: Payer: Self-pay | Admitting: Neurology

## 2013-03-14 DIAGNOSIS — I4891 Unspecified atrial fibrillation: Secondary | ICD-10-CM | POA: Diagnosis not present

## 2013-03-14 DIAGNOSIS — N319 Neuromuscular dysfunction of bladder, unspecified: Secondary | ICD-10-CM | POA: Diagnosis not present

## 2013-03-14 DIAGNOSIS — Z7901 Long term (current) use of anticoagulants: Secondary | ICD-10-CM | POA: Diagnosis not present

## 2013-03-14 DIAGNOSIS — IMO0001 Reserved for inherently not codable concepts without codable children: Secondary | ICD-10-CM | POA: Diagnosis not present

## 2013-03-14 DIAGNOSIS — G35 Multiple sclerosis: Secondary | ICD-10-CM | POA: Diagnosis not present

## 2013-03-14 DIAGNOSIS — R269 Unspecified abnormalities of gait and mobility: Secondary | ICD-10-CM | POA: Diagnosis not present

## 2013-03-14 NOTE — Telephone Encounter (Signed)
Spoke to patient and relayed that she may use Acthar and Betaseron together, per Dr. Vickey Huger.

## 2013-03-17 NOTE — Telephone Encounter (Signed)
Left message for PT, Maurine Minister, and told him Dr. Vickey Huger had entered written orders for OT and Speech.

## 2013-03-18 DIAGNOSIS — N319 Neuromuscular dysfunction of bladder, unspecified: Secondary | ICD-10-CM | POA: Diagnosis not present

## 2013-03-18 DIAGNOSIS — R269 Unspecified abnormalities of gait and mobility: Secondary | ICD-10-CM | POA: Diagnosis not present

## 2013-03-18 DIAGNOSIS — Z7901 Long term (current) use of anticoagulants: Secondary | ICD-10-CM | POA: Diagnosis not present

## 2013-03-18 DIAGNOSIS — G35 Multiple sclerosis: Secondary | ICD-10-CM | POA: Diagnosis not present

## 2013-03-18 DIAGNOSIS — I4891 Unspecified atrial fibrillation: Secondary | ICD-10-CM | POA: Diagnosis not present

## 2013-03-18 DIAGNOSIS — IMO0001 Reserved for inherently not codable concepts without codable children: Secondary | ICD-10-CM | POA: Diagnosis not present

## 2013-03-19 ENCOUNTER — Telehealth: Payer: Self-pay

## 2013-03-19 ENCOUNTER — Other Ambulatory Visit: Payer: Self-pay | Admitting: Neurology

## 2013-03-19 ENCOUNTER — Telehealth: Payer: Self-pay | Admitting: Neurology

## 2013-03-19 DIAGNOSIS — G35 Multiple sclerosis: Secondary | ICD-10-CM

## 2013-03-19 MED ORDER — CORTICOTROPIN 80 UNIT/ML IJ GEL: 80.0000 [IU] | Freq: Every day | INTRAMUSCULAR | Status: DC

## 2013-03-19 NOTE — Telephone Encounter (Signed)
Patient called requesting her Acthar instructions for administration be changed from IM to SQ, as she would prefer to inject SQ.  This will also allow her to get a different syringe.  She would like an updated Rx sent to [facility], if change is allowed.  Please advise.  Thank you.

## 2013-03-19 NOTE — Telephone Encounter (Signed)
Change to subcutaneous from intrmuscular. ACTHAR

## 2013-03-19 NOTE — Telephone Encounter (Signed)
I have no problem with this change, but I am not the treating physician, I will refer this note to Dr. Vickey Huger.

## 2013-03-20 ENCOUNTER — Telehealth: Payer: Self-pay

## 2013-03-20 ENCOUNTER — Telehealth: Payer: Self-pay | Admitting: Neurology

## 2013-03-20 DIAGNOSIS — IMO0001 Reserved for inherently not codable concepts without codable children: Secondary | ICD-10-CM | POA: Diagnosis not present

## 2013-03-20 DIAGNOSIS — G35 Multiple sclerosis: Secondary | ICD-10-CM | POA: Diagnosis not present

## 2013-03-20 DIAGNOSIS — R269 Unspecified abnormalities of gait and mobility: Secondary | ICD-10-CM | POA: Diagnosis not present

## 2013-03-20 DIAGNOSIS — Z7901 Long term (current) use of anticoagulants: Secondary | ICD-10-CM | POA: Diagnosis not present

## 2013-03-20 DIAGNOSIS — N319 Neuromuscular dysfunction of bladder, unspecified: Secondary | ICD-10-CM | POA: Diagnosis not present

## 2013-03-20 DIAGNOSIS — I4891 Unspecified atrial fibrillation: Secondary | ICD-10-CM | POA: Diagnosis not present

## 2013-03-20 NOTE — Telephone Encounter (Signed)
Noted  

## 2013-03-20 NOTE — Telephone Encounter (Signed)
[facility] called requesting that we initiate a prior auth for Betaseron before 4:00 today.  I have initiated prior auth online, pending insurance response.

## 2013-03-21 DIAGNOSIS — R269 Unspecified abnormalities of gait and mobility: Secondary | ICD-10-CM | POA: Diagnosis not present

## 2013-03-21 DIAGNOSIS — N319 Neuromuscular dysfunction of bladder, unspecified: Secondary | ICD-10-CM | POA: Diagnosis not present

## 2013-03-21 DIAGNOSIS — I4891 Unspecified atrial fibrillation: Secondary | ICD-10-CM | POA: Diagnosis not present

## 2013-03-21 DIAGNOSIS — IMO0001 Reserved for inherently not codable concepts without codable children: Secondary | ICD-10-CM | POA: Diagnosis not present

## 2013-03-21 DIAGNOSIS — Z7901 Long term (current) use of anticoagulants: Secondary | ICD-10-CM | POA: Diagnosis not present

## 2013-03-21 DIAGNOSIS — G35 Multiple sclerosis: Secondary | ICD-10-CM | POA: Diagnosis not present

## 2013-03-24 ENCOUNTER — Telehealth: Payer: Self-pay | Admitting: Neurology

## 2013-03-24 DIAGNOSIS — G35 Multiple sclerosis: Secondary | ICD-10-CM | POA: Diagnosis not present

## 2013-03-24 DIAGNOSIS — N319 Neuromuscular dysfunction of bladder, unspecified: Secondary | ICD-10-CM | POA: Diagnosis not present

## 2013-03-24 DIAGNOSIS — Z7901 Long term (current) use of anticoagulants: Secondary | ICD-10-CM | POA: Diagnosis not present

## 2013-03-24 DIAGNOSIS — R269 Unspecified abnormalities of gait and mobility: Secondary | ICD-10-CM | POA: Diagnosis not present

## 2013-03-24 DIAGNOSIS — I4891 Unspecified atrial fibrillation: Secondary | ICD-10-CM | POA: Diagnosis not present

## 2013-03-24 DIAGNOSIS — IMO0001 Reserved for inherently not codable concepts without codable children: Secondary | ICD-10-CM | POA: Diagnosis not present

## 2013-03-25 DIAGNOSIS — R269 Unspecified abnormalities of gait and mobility: Secondary | ICD-10-CM | POA: Diagnosis not present

## 2013-03-25 DIAGNOSIS — I4891 Unspecified atrial fibrillation: Secondary | ICD-10-CM | POA: Diagnosis not present

## 2013-03-25 DIAGNOSIS — G35 Multiple sclerosis: Secondary | ICD-10-CM | POA: Diagnosis not present

## 2013-03-25 DIAGNOSIS — IMO0001 Reserved for inherently not codable concepts without codable children: Secondary | ICD-10-CM | POA: Diagnosis not present

## 2013-03-25 DIAGNOSIS — N319 Neuromuscular dysfunction of bladder, unspecified: Secondary | ICD-10-CM | POA: Diagnosis not present

## 2013-03-25 DIAGNOSIS — Z7901 Long term (current) use of anticoagulants: Secondary | ICD-10-CM | POA: Diagnosis not present

## 2013-03-25 NOTE — Telephone Encounter (Signed)
Patient states she is having a lot of bruises from her Athacar shots, and wants to know if this is normal. She called the nurse who showed her how to use the Athacar  shots and they told her it could be that her blood platelets are changing and need to be check. Patient states or it could be related to a blood thinner she is taking. Please advise best contact for the patient is [phone]

## 2013-03-26 ENCOUNTER — Other Ambulatory Visit: Payer: Self-pay | Admitting: Neurology

## 2013-03-26 ENCOUNTER — Telehealth: Payer: Self-pay | Admitting: Neurology

## 2013-03-26 DIAGNOSIS — G35 Multiple sclerosis: Secondary | ICD-10-CM

## 2013-03-26 DIAGNOSIS — T148XXA Other injury of unspecified body region, initial encounter: Secondary | ICD-10-CM

## 2013-03-26 MED ORDER — CORTICOTROPIN 80 UNIT/ML IJ GEL: 80.0000 [IU] | Freq: Every day | INTRAMUSCULAR | Status: DC

## 2013-03-26 NOTE — Telephone Encounter (Signed)
Come to office to have labs drawn tomorrow, 03-27-13. She also advised me that up to [facility] pharmacy has not received the change from intramuscular to subcutaneous injection for acthar.

## 2013-03-26 NOTE — Telephone Encounter (Signed)
Rx was already sent: E-Prescribing Status: Receipt confirmed by pharmacy (03/19/2013 10:44 AM EDT) I have resent it again.

## 2013-03-27 ENCOUNTER — Other Ambulatory Visit: Payer: Self-pay | Admitting: Neurology

## 2013-03-27 DIAGNOSIS — T148XXA Other injury of unspecified body region, initial encounter: Secondary | ICD-10-CM | POA: Diagnosis not present

## 2013-03-27 DIAGNOSIS — G35 Multiple sclerosis: Secondary | ICD-10-CM | POA: Diagnosis not present

## 2013-03-27 LAB — CBC WITH DIFFERENTIAL
Basos: 0 % (ref 0–3)
Eos: 4 % (ref 0–5)
Hemoglobin: 14 g/dL (ref 11.1–15.9)
MCH: 30.8 pg (ref 26.6–33.0)
MCV: 90 fL (ref 79–97)
Monocytes Absolute: 0.8 10*3/uL (ref 0.1–0.9)
Neutrophils Relative %: 46 % (ref 40–74)
RBC: 4.55 x10E6/uL (ref 3.77–5.28)

## 2013-03-27 NOTE — Telephone Encounter (Signed)
Patient is having labs drawn in our office today, per patient's husband.

## 2013-03-27 NOTE — Telephone Encounter (Signed)
Spoke to husband and Acthar subcutaneous has been resent.

## 2013-03-28 DIAGNOSIS — G35 Multiple sclerosis: Secondary | ICD-10-CM | POA: Diagnosis not present

## 2013-03-28 DIAGNOSIS — N319 Neuromuscular dysfunction of bladder, unspecified: Secondary | ICD-10-CM | POA: Diagnosis not present

## 2013-03-28 DIAGNOSIS — R269 Unspecified abnormalities of gait and mobility: Secondary | ICD-10-CM | POA: Diagnosis not present

## 2013-03-28 DIAGNOSIS — IMO0001 Reserved for inherently not codable concepts without codable children: Secondary | ICD-10-CM | POA: Diagnosis not present

## 2013-03-28 DIAGNOSIS — Z7901 Long term (current) use of anticoagulants: Secondary | ICD-10-CM | POA: Diagnosis not present

## 2013-03-28 DIAGNOSIS — I4891 Unspecified atrial fibrillation: Secondary | ICD-10-CM | POA: Diagnosis not present

## 2013-03-31 ENCOUNTER — Telehealth: Payer: Self-pay | Admitting: Neurology

## 2013-03-31 DIAGNOSIS — R269 Unspecified abnormalities of gait and mobility: Secondary | ICD-10-CM | POA: Diagnosis not present

## 2013-03-31 DIAGNOSIS — Z7901 Long term (current) use of anticoagulants: Secondary | ICD-10-CM | POA: Diagnosis not present

## 2013-03-31 DIAGNOSIS — N319 Neuromuscular dysfunction of bladder, unspecified: Secondary | ICD-10-CM | POA: Diagnosis not present

## 2013-03-31 DIAGNOSIS — I4891 Unspecified atrial fibrillation: Secondary | ICD-10-CM | POA: Diagnosis not present

## 2013-03-31 DIAGNOSIS — IMO0001 Reserved for inherently not codable concepts without codable children: Secondary | ICD-10-CM | POA: Diagnosis not present

## 2013-03-31 DIAGNOSIS — G35 Multiple sclerosis: Secondary | ICD-10-CM | POA: Diagnosis not present

## 2013-03-31 NOTE — Telephone Encounter (Signed)
I have spoken with Mr Heggs.  He is aware.  He will be in to pick up vial and will return it to [name] once used.

## 2013-03-31 NOTE — Telephone Encounter (Signed)
I spoke to the patient on Sunday, 08//2014 at about 5:30 PM. The patient noted slurring of speech generalized weak and unable to ambulate. She feels that the ACTHAR gel has given her a good 14 days being symptom-free, and energetic and able to transfer and ambulate. Since the medication will only reach her house on Tuesday or Wedenesday , I offered her a sample doors from our office to be given by her home health care agency. The and the mild has to be brought back to our office receive another free sample.  Mellody Dance , for some reason you are listed as prescriber.

## 2013-04-02 DIAGNOSIS — I4891 Unspecified atrial fibrillation: Secondary | ICD-10-CM | POA: Diagnosis not present

## 2013-04-02 DIAGNOSIS — Z7901 Long term (current) use of anticoagulants: Secondary | ICD-10-CM | POA: Diagnosis not present

## 2013-04-02 DIAGNOSIS — IMO0001 Reserved for inherently not codable concepts without codable children: Secondary | ICD-10-CM | POA: Diagnosis not present

## 2013-04-02 DIAGNOSIS — R269 Unspecified abnormalities of gait and mobility: Secondary | ICD-10-CM | POA: Diagnosis not present

## 2013-04-02 DIAGNOSIS — G35 Multiple sclerosis: Secondary | ICD-10-CM | POA: Diagnosis not present

## 2013-04-02 DIAGNOSIS — N319 Neuromuscular dysfunction of bladder, unspecified: Secondary | ICD-10-CM | POA: Diagnosis not present

## 2013-04-03 ENCOUNTER — Telehealth: Payer: Self-pay | Admitting: Neurology

## 2013-04-03 ENCOUNTER — Ambulatory Visit (INDEPENDENT_AMBULATORY_CARE_PROVIDER_SITE_OTHER): Payer: Medicare Other | Admitting: Pharmacist Clinician (PhC)/ Clinical Pharmacy Specialist

## 2013-04-03 VITALS — BP 132/74 | HR 72

## 2013-04-03 DIAGNOSIS — I4891 Unspecified atrial fibrillation: Secondary | ICD-10-CM

## 2013-04-03 DIAGNOSIS — Z7901 Long term (current) use of anticoagulants: Secondary | ICD-10-CM | POA: Diagnosis not present

## 2013-04-04 ENCOUNTER — Ambulatory Visit: Payer: Medicare Other | Admitting: Pharmacist Clinician (PhC)/ Clinical Pharmacy Specialist

## 2013-04-04 ENCOUNTER — Telehealth: Payer: Self-pay | Admitting: Neurology

## 2013-04-04 DIAGNOSIS — Z7901 Long term (current) use of anticoagulants: Secondary | ICD-10-CM | POA: Diagnosis not present

## 2013-04-04 DIAGNOSIS — R269 Unspecified abnormalities of gait and mobility: Secondary | ICD-10-CM | POA: Diagnosis not present

## 2013-04-04 DIAGNOSIS — N319 Neuromuscular dysfunction of bladder, unspecified: Secondary | ICD-10-CM | POA: Diagnosis not present

## 2013-04-04 DIAGNOSIS — G35 Multiple sclerosis: Secondary | ICD-10-CM | POA: Diagnosis not present

## 2013-04-04 DIAGNOSIS — I4891 Unspecified atrial fibrillation: Secondary | ICD-10-CM | POA: Diagnosis not present

## 2013-04-04 DIAGNOSIS — Z5189 Encounter for other specified aftercare: Secondary | ICD-10-CM | POA: Diagnosis not present

## 2013-04-04 NOTE — Telephone Encounter (Signed)
Patient requesting lab results

## 2013-04-04 NOTE — Telephone Encounter (Signed)
I called patient. The patient is having bruising with the ACTHAR injections. The patient does not get bruising with the Betaseron injections. The INR is 2.4, and a CBC is normal. The reason for the bruising likely has to with the difference in discussed that he of the medication. The injection of ACTHAR likely is tearing small venules causing increased bleeding.

## 2013-04-07 DIAGNOSIS — G35 Multiple sclerosis: Secondary | ICD-10-CM | POA: Diagnosis not present

## 2013-04-07 DIAGNOSIS — Z7901 Long term (current) use of anticoagulants: Secondary | ICD-10-CM | POA: Diagnosis not present

## 2013-04-07 DIAGNOSIS — R269 Unspecified abnormalities of gait and mobility: Secondary | ICD-10-CM | POA: Diagnosis not present

## 2013-04-07 DIAGNOSIS — N319 Neuromuscular dysfunction of bladder, unspecified: Secondary | ICD-10-CM | POA: Diagnosis not present

## 2013-04-07 DIAGNOSIS — Z5189 Encounter for other specified aftercare: Secondary | ICD-10-CM | POA: Diagnosis not present

## 2013-04-07 DIAGNOSIS — I4891 Unspecified atrial fibrillation: Secondary | ICD-10-CM | POA: Diagnosis not present

## 2013-04-08 DIAGNOSIS — Z7901 Long term (current) use of anticoagulants: Secondary | ICD-10-CM | POA: Diagnosis not present

## 2013-04-08 DIAGNOSIS — I4891 Unspecified atrial fibrillation: Secondary | ICD-10-CM | POA: Diagnosis not present

## 2013-04-08 DIAGNOSIS — N319 Neuromuscular dysfunction of bladder, unspecified: Secondary | ICD-10-CM | POA: Diagnosis not present

## 2013-04-08 DIAGNOSIS — G35 Multiple sclerosis: Secondary | ICD-10-CM | POA: Diagnosis not present

## 2013-04-08 DIAGNOSIS — Z5189 Encounter for other specified aftercare: Secondary | ICD-10-CM | POA: Diagnosis not present

## 2013-04-08 DIAGNOSIS — R269 Unspecified abnormalities of gait and mobility: Secondary | ICD-10-CM | POA: Diagnosis not present

## 2013-04-08 NOTE — Telephone Encounter (Signed)
I called and LMVM for Toni [name] re: order for ST.

## 2013-04-08 NOTE — Telephone Encounter (Signed)
WID:  Speech therapist with [facility] asking for verbal order to continue treatment for patient.

## 2013-04-09 DIAGNOSIS — Z961 Presence of intraocular lens: Secondary | ICD-10-CM | POA: Diagnosis not present

## 2013-04-10 DIAGNOSIS — R109 Unspecified abdominal pain: Secondary | ICD-10-CM | POA: Diagnosis not present

## 2013-04-10 DIAGNOSIS — R160 Hepatomegaly, not elsewhere classified: Secondary | ICD-10-CM | POA: Diagnosis not present

## 2013-04-10 NOTE — Telephone Encounter (Signed)
WID:  Allen Derry the speech therapist form [facility] left message that the reason for the verbal order for continued ST is because the patient would benefit for more breathing support exercises.  She would like to teach some compensatory strategies for safety with swallowing.  And the patient would benefit from more teaching to make sure she is doing it correctly.  She will need more visits to do it.  I will be glad to call ST is you agree with verbal order.

## 2013-04-10 NOTE — Telephone Encounter (Signed)
I am fine with an order for more speech therapy. Thanks for calling.

## 2013-04-11 ENCOUNTER — Telehealth: Payer: Self-pay | Admitting: Neurology

## 2013-04-11 DIAGNOSIS — Z7901 Long term (current) use of anticoagulants: Secondary | ICD-10-CM | POA: Diagnosis not present

## 2013-04-11 DIAGNOSIS — I4891 Unspecified atrial fibrillation: Secondary | ICD-10-CM | POA: Diagnosis not present

## 2013-04-11 DIAGNOSIS — R269 Unspecified abnormalities of gait and mobility: Secondary | ICD-10-CM | POA: Diagnosis not present

## 2013-04-11 DIAGNOSIS — G35 Multiple sclerosis: Secondary | ICD-10-CM | POA: Diagnosis not present

## 2013-04-11 DIAGNOSIS — N319 Neuromuscular dysfunction of bladder, unspecified: Secondary | ICD-10-CM | POA: Diagnosis not present

## 2013-04-11 DIAGNOSIS — Z5189 Encounter for other specified aftercare: Secondary | ICD-10-CM | POA: Diagnosis not present

## 2013-04-11 NOTE — Telephone Encounter (Signed)
WID:  The physical therapist with [facility] left message for an extension of PT for a couple weeks for strengthing, stability and balance.  I will be glad to call him with a verbal order if you agree.  Also the patient is asking for a power wheelchair, is that something Dr. Vickey Huger would have to agree to.

## 2013-04-11 NOTE — Telephone Encounter (Signed)
Please give a verbal okay for PT. For power wheelchair patient will have to be evaluated by treating physician and that is usually a separate appointment. Thanks

## 2013-04-11 NOTE — Telephone Encounter (Signed)
Left message for ST that it is ok to continue speech therapy, per Dr. Hosie Poisson.

## 2013-04-14 DIAGNOSIS — R269 Unspecified abnormalities of gait and mobility: Secondary | ICD-10-CM | POA: Diagnosis not present

## 2013-04-14 DIAGNOSIS — G35 Multiple sclerosis: Secondary | ICD-10-CM | POA: Diagnosis not present

## 2013-04-14 DIAGNOSIS — Z5189 Encounter for other specified aftercare: Secondary | ICD-10-CM | POA: Diagnosis not present

## 2013-04-14 DIAGNOSIS — N319 Neuromuscular dysfunction of bladder, unspecified: Secondary | ICD-10-CM | POA: Diagnosis not present

## 2013-04-14 DIAGNOSIS — Z7901 Long term (current) use of anticoagulants: Secondary | ICD-10-CM | POA: Diagnosis not present

## 2013-04-14 DIAGNOSIS — I4891 Unspecified atrial fibrillation: Secondary | ICD-10-CM | POA: Diagnosis not present

## 2013-04-14 NOTE — Telephone Encounter (Signed)
I LMVM for [facility] with [facility], that ok for PT.  Wheelchair to be addressed with Dr. Vickey Huger when she returns.

## 2013-04-15 ENCOUNTER — Telehealth: Payer: Self-pay | Admitting: Neurology

## 2013-04-15 DIAGNOSIS — G35 Multiple sclerosis: Secondary | ICD-10-CM | POA: Diagnosis not present

## 2013-04-15 DIAGNOSIS — N319 Neuromuscular dysfunction of bladder, unspecified: Secondary | ICD-10-CM | POA: Diagnosis not present

## 2013-04-15 DIAGNOSIS — I4891 Unspecified atrial fibrillation: Secondary | ICD-10-CM | POA: Diagnosis not present

## 2013-04-15 DIAGNOSIS — R269 Unspecified abnormalities of gait and mobility: Secondary | ICD-10-CM | POA: Diagnosis not present

## 2013-04-15 DIAGNOSIS — Z5189 Encounter for other specified aftercare: Secondary | ICD-10-CM | POA: Diagnosis not present

## 2013-04-15 DIAGNOSIS — Z7901 Long term (current) use of anticoagulants: Secondary | ICD-10-CM | POA: Diagnosis not present

## 2013-04-15 NOTE — Telephone Encounter (Signed)
Megan from [facility] left message for a verbal order for a bedside commode for patient.

## 2013-04-16 DIAGNOSIS — Z6841 Body Mass Index (BMI) 40.0 and over, adult: Secondary | ICD-10-CM | POA: Diagnosis not present

## 2013-04-16 DIAGNOSIS — I10 Essential (primary) hypertension: Secondary | ICD-10-CM | POA: Diagnosis not present

## 2013-04-16 DIAGNOSIS — G35 Multiple sclerosis: Secondary | ICD-10-CM | POA: Diagnosis not present

## 2013-04-16 DIAGNOSIS — R609 Edema, unspecified: Secondary | ICD-10-CM | POA: Diagnosis not present

## 2013-04-16 DIAGNOSIS — R7301 Impaired fasting glucose: Secondary | ICD-10-CM | POA: Diagnosis not present

## 2013-04-17 DIAGNOSIS — R269 Unspecified abnormalities of gait and mobility: Secondary | ICD-10-CM | POA: Diagnosis not present

## 2013-04-17 DIAGNOSIS — G35 Multiple sclerosis: Secondary | ICD-10-CM | POA: Diagnosis not present

## 2013-04-17 DIAGNOSIS — I4891 Unspecified atrial fibrillation: Secondary | ICD-10-CM | POA: Diagnosis not present

## 2013-04-17 DIAGNOSIS — Z5189 Encounter for other specified aftercare: Secondary | ICD-10-CM | POA: Diagnosis not present

## 2013-04-17 DIAGNOSIS — N319 Neuromuscular dysfunction of bladder, unspecified: Secondary | ICD-10-CM | POA: Diagnosis not present

## 2013-04-17 DIAGNOSIS — Z7901 Long term (current) use of anticoagulants: Secondary | ICD-10-CM | POA: Diagnosis not present

## 2013-04-22 ENCOUNTER — Telehealth: Payer: Self-pay | Admitting: Neurology

## 2013-04-22 DIAGNOSIS — N319 Neuromuscular dysfunction of bladder, unspecified: Secondary | ICD-10-CM | POA: Diagnosis not present

## 2013-04-22 DIAGNOSIS — Z5189 Encounter for other specified aftercare: Secondary | ICD-10-CM | POA: Diagnosis not present

## 2013-04-22 DIAGNOSIS — G35 Multiple sclerosis: Secondary | ICD-10-CM | POA: Diagnosis not present

## 2013-04-22 DIAGNOSIS — I4891 Unspecified atrial fibrillation: Secondary | ICD-10-CM | POA: Diagnosis not present

## 2013-04-22 DIAGNOSIS — R269 Unspecified abnormalities of gait and mobility: Secondary | ICD-10-CM | POA: Diagnosis not present

## 2013-04-22 DIAGNOSIS — Z7901 Long term (current) use of anticoagulants: Secondary | ICD-10-CM | POA: Diagnosis not present

## 2013-04-22 NOTE — Telephone Encounter (Signed)
I called patient. The patient had some pain around the pump site this morning. She is feeling better now. If this spasm continues, we can readjust the pump settings.

## 2013-04-22 NOTE — Telephone Encounter (Signed)
I spoke to patient who states she woke with right sided tightness which then became centralized where her Baclofen pump is located.  The tightness is intermittent not as bad now as it was earlier.  [phone]

## 2013-04-23 ENCOUNTER — Other Ambulatory Visit: Payer: Self-pay | Admitting: Neurology

## 2013-04-23 DIAGNOSIS — G35 Multiple sclerosis: Secondary | ICD-10-CM

## 2013-04-23 DIAGNOSIS — M62838 Other muscle spasm: Secondary | ICD-10-CM

## 2013-04-23 NOTE — Telephone Encounter (Signed)
Verbal order given by Dr. Vickey Huger for [facility], I called [facility] with the order.

## 2013-04-24 DIAGNOSIS — Z7901 Long term (current) use of anticoagulants: Secondary | ICD-10-CM | POA: Diagnosis not present

## 2013-04-24 DIAGNOSIS — G35 Multiple sclerosis: Secondary | ICD-10-CM | POA: Diagnosis not present

## 2013-04-24 DIAGNOSIS — R269 Unspecified abnormalities of gait and mobility: Secondary | ICD-10-CM | POA: Diagnosis not present

## 2013-04-24 DIAGNOSIS — Z5189 Encounter for other specified aftercare: Secondary | ICD-10-CM | POA: Diagnosis not present

## 2013-04-24 DIAGNOSIS — N319 Neuromuscular dysfunction of bladder, unspecified: Secondary | ICD-10-CM | POA: Diagnosis not present

## 2013-04-24 DIAGNOSIS — I4891 Unspecified atrial fibrillation: Secondary | ICD-10-CM | POA: Diagnosis not present

## 2013-04-25 DIAGNOSIS — R269 Unspecified abnormalities of gait and mobility: Secondary | ICD-10-CM | POA: Diagnosis not present

## 2013-04-25 DIAGNOSIS — N319 Neuromuscular dysfunction of bladder, unspecified: Secondary | ICD-10-CM | POA: Diagnosis not present

## 2013-04-25 DIAGNOSIS — G35 Multiple sclerosis: Secondary | ICD-10-CM | POA: Diagnosis not present

## 2013-04-25 DIAGNOSIS — Z5189 Encounter for other specified aftercare: Secondary | ICD-10-CM | POA: Diagnosis not present

## 2013-04-25 DIAGNOSIS — I4891 Unspecified atrial fibrillation: Secondary | ICD-10-CM | POA: Diagnosis not present

## 2013-04-25 DIAGNOSIS — Z7901 Long term (current) use of anticoagulants: Secondary | ICD-10-CM | POA: Diagnosis not present

## 2013-04-29 ENCOUNTER — Telehealth: Payer: Self-pay | Admitting: Neurology

## 2013-04-29 DIAGNOSIS — G35 Multiple sclerosis: Secondary | ICD-10-CM | POA: Diagnosis not present

## 2013-04-29 DIAGNOSIS — I4891 Unspecified atrial fibrillation: Secondary | ICD-10-CM | POA: Diagnosis not present

## 2013-04-29 DIAGNOSIS — Z5189 Encounter for other specified aftercare: Secondary | ICD-10-CM | POA: Diagnosis not present

## 2013-04-29 DIAGNOSIS — R269 Unspecified abnormalities of gait and mobility: Secondary | ICD-10-CM | POA: Diagnosis not present

## 2013-04-29 DIAGNOSIS — Z7901 Long term (current) use of anticoagulants: Secondary | ICD-10-CM | POA: Diagnosis not present

## 2013-04-29 DIAGNOSIS — N319 Neuromuscular dysfunction of bladder, unspecified: Secondary | ICD-10-CM | POA: Diagnosis not present

## 2013-04-30 DIAGNOSIS — N319 Neuromuscular dysfunction of bladder, unspecified: Secondary | ICD-10-CM | POA: Diagnosis not present

## 2013-04-30 DIAGNOSIS — G35 Multiple sclerosis: Secondary | ICD-10-CM | POA: Diagnosis not present

## 2013-04-30 DIAGNOSIS — Z7901 Long term (current) use of anticoagulants: Secondary | ICD-10-CM | POA: Diagnosis not present

## 2013-04-30 DIAGNOSIS — I4891 Unspecified atrial fibrillation: Secondary | ICD-10-CM | POA: Diagnosis not present

## 2013-04-30 DIAGNOSIS — Z5189 Encounter for other specified aftercare: Secondary | ICD-10-CM | POA: Diagnosis not present

## 2013-04-30 DIAGNOSIS — R269 Unspecified abnormalities of gait and mobility: Secondary | ICD-10-CM | POA: Diagnosis not present

## 2013-04-30 NOTE — Telephone Encounter (Signed)
Patient states she will need Baclofen pump refill by 05/27/13.

## 2013-05-01 ENCOUNTER — Ambulatory Visit: Payer: Medicare Other | Admitting: Pharmacist Clinician (PhC)/ Clinical Pharmacy Specialist

## 2013-05-01 DIAGNOSIS — G35 Multiple sclerosis: Secondary | ICD-10-CM | POA: Diagnosis not present

## 2013-05-01 DIAGNOSIS — R269 Unspecified abnormalities of gait and mobility: Secondary | ICD-10-CM | POA: Diagnosis not present

## 2013-05-01 DIAGNOSIS — Z7901 Long term (current) use of anticoagulants: Secondary | ICD-10-CM | POA: Diagnosis not present

## 2013-05-01 DIAGNOSIS — I4891 Unspecified atrial fibrillation: Secondary | ICD-10-CM | POA: Diagnosis not present

## 2013-05-01 DIAGNOSIS — Z5189 Encounter for other specified aftercare: Secondary | ICD-10-CM | POA: Diagnosis not present

## 2013-05-01 DIAGNOSIS — N319 Neuromuscular dysfunction of bladder, unspecified: Secondary | ICD-10-CM | POA: Diagnosis not present

## 2013-05-06 ENCOUNTER — Ambulatory Visit (INDEPENDENT_AMBULATORY_CARE_PROVIDER_SITE_OTHER): Payer: Medicare Other | Admitting: Pharmacist Clinician (PhC)/ Clinical Pharmacy Specialist

## 2013-05-06 DIAGNOSIS — R269 Unspecified abnormalities of gait and mobility: Secondary | ICD-10-CM | POA: Diagnosis not present

## 2013-05-06 DIAGNOSIS — Z7901 Long term (current) use of anticoagulants: Secondary | ICD-10-CM | POA: Diagnosis not present

## 2013-05-06 DIAGNOSIS — Z5189 Encounter for other specified aftercare: Secondary | ICD-10-CM | POA: Diagnosis not present

## 2013-05-06 DIAGNOSIS — I4891 Unspecified atrial fibrillation: Secondary | ICD-10-CM | POA: Diagnosis not present

## 2013-05-06 DIAGNOSIS — N319 Neuromuscular dysfunction of bladder, unspecified: Secondary | ICD-10-CM | POA: Diagnosis not present

## 2013-05-06 DIAGNOSIS — G35 Multiple sclerosis: Secondary | ICD-10-CM | POA: Diagnosis not present

## 2013-05-06 LAB — POCT INR: INR: 2.3

## 2013-05-08 ENCOUNTER — Telehealth: Payer: Self-pay | Admitting: Neurology

## 2013-05-08 DIAGNOSIS — I4891 Unspecified atrial fibrillation: Secondary | ICD-10-CM | POA: Diagnosis not present

## 2013-05-08 DIAGNOSIS — Z5189 Encounter for other specified aftercare: Secondary | ICD-10-CM | POA: Diagnosis not present

## 2013-05-08 DIAGNOSIS — Z7901 Long term (current) use of anticoagulants: Secondary | ICD-10-CM | POA: Diagnosis not present

## 2013-05-08 DIAGNOSIS — N319 Neuromuscular dysfunction of bladder, unspecified: Secondary | ICD-10-CM | POA: Diagnosis not present

## 2013-05-08 DIAGNOSIS — G35 Multiple sclerosis: Secondary | ICD-10-CM | POA: Diagnosis not present

## 2013-05-08 DIAGNOSIS — R269 Unspecified abnormalities of gait and mobility: Secondary | ICD-10-CM | POA: Diagnosis not present

## 2013-05-09 ENCOUNTER — Telehealth: Payer: Self-pay | Admitting: Pharmacist Clinician (PhC)/ Clinical Pharmacy Specialist

## 2013-05-09 DIAGNOSIS — N390 Urinary tract infection, site not specified: Secondary | ICD-10-CM | POA: Diagnosis not present

## 2013-05-09 NOTE — Telephone Encounter (Signed)
Please call when you have time. °

## 2013-05-12 DIAGNOSIS — Z5189 Encounter for other specified aftercare: Secondary | ICD-10-CM | POA: Diagnosis not present

## 2013-05-12 DIAGNOSIS — I4891 Unspecified atrial fibrillation: Secondary | ICD-10-CM | POA: Diagnosis not present

## 2013-05-12 DIAGNOSIS — R269 Unspecified abnormalities of gait and mobility: Secondary | ICD-10-CM | POA: Diagnosis not present

## 2013-05-12 DIAGNOSIS — N319 Neuromuscular dysfunction of bladder, unspecified: Secondary | ICD-10-CM | POA: Diagnosis not present

## 2013-05-12 DIAGNOSIS — G35 Multiple sclerosis: Secondary | ICD-10-CM | POA: Diagnosis not present

## 2013-05-12 DIAGNOSIS — Z7901 Long term (current) use of anticoagulants: Secondary | ICD-10-CM | POA: Diagnosis not present

## 2013-05-12 NOTE — Telephone Encounter (Signed)
Wanted to update medication list/allergies

## 2013-05-13 ENCOUNTER — Telehealth: Payer: Self-pay | Admitting: *Deleted

## 2013-05-15 ENCOUNTER — Encounter: Payer: Self-pay | Admitting: Neurology

## 2013-05-15 ENCOUNTER — Encounter (HOSPITAL_COMMUNITY): Payer: Self-pay | Admitting: Emergency Medicine

## 2013-05-15 ENCOUNTER — Observation Stay (HOSPITAL_COMMUNITY)
Admission: EM | Admit: 2013-05-15 | Discharge: 2013-05-15 | Disposition: A | Discharge status: Home / Self Care | Payer: Medicare Other | Attending: Emergency Medicine | Admitting: Emergency Medicine

## 2013-05-15 ENCOUNTER — Telehealth: Payer: Self-pay | Admitting: *Deleted

## 2013-05-15 ENCOUNTER — Emergency Department (HOSPITAL_COMMUNITY): Payer: Medicare Other

## 2013-05-15 ENCOUNTER — Ambulatory Visit (INDEPENDENT_AMBULATORY_CARE_PROVIDER_SITE_OTHER): Payer: Medicare Other | Admitting: Neurology

## 2013-05-15 VITALS — BP 126/76 | HR 62

## 2013-05-15 DIAGNOSIS — G35 Multiple sclerosis: Secondary | ICD-10-CM | POA: Diagnosis not present

## 2013-05-15 DIAGNOSIS — I4891 Unspecified atrial fibrillation: Secondary | ICD-10-CM | POA: Insufficient documentation

## 2013-05-15 DIAGNOSIS — T481X5A Adverse effect of skeletal muscle relaxants [neuromuscular blocking agents], initial encounter: Secondary | ICD-10-CM | POA: Insufficient documentation

## 2013-05-15 DIAGNOSIS — E079 Disorder of thyroid, unspecified: Secondary | ICD-10-CM | POA: Diagnosis not present

## 2013-05-15 DIAGNOSIS — I10 Essential (primary) hypertension: Secondary | ICD-10-CM | POA: Diagnosis not present

## 2013-05-15 DIAGNOSIS — R209 Unspecified disturbances of skin sensation: Secondary | ICD-10-CM | POA: Diagnosis not present

## 2013-05-15 DIAGNOSIS — Z7901 Long term (current) use of anticoagulants: Secondary | ICD-10-CM | POA: Diagnosis not present

## 2013-05-15 DIAGNOSIS — R404 Transient alteration of awareness: Principal | ICD-10-CM | POA: Insufficient documentation

## 2013-05-15 DIAGNOSIS — Z79899 Other long term (current) drug therapy: Secondary | ICD-10-CM | POA: Diagnosis not present

## 2013-05-15 DIAGNOSIS — G4730 Sleep apnea, unspecified: Secondary | ICD-10-CM | POA: Diagnosis not present

## 2013-05-15 DIAGNOSIS — R5381 Other malaise: Secondary | ICD-10-CM | POA: Diagnosis not present

## 2013-05-15 DIAGNOSIS — E785 Hyperlipidemia, unspecified: Secondary | ICD-10-CM | POA: Diagnosis not present

## 2013-05-15 DIAGNOSIS — T85698A Other mechanical complication of other specified internal prosthetic devices, implants and grafts, initial encounter: Secondary | ICD-10-CM

## 2013-05-15 DIAGNOSIS — T85610A Breakdown (mechanical) of epidural and subdural infusion catheter, initial encounter: Secondary | ICD-10-CM

## 2013-05-15 DIAGNOSIS — R0602 Shortness of breath: Secondary | ICD-10-CM | POA: Diagnosis not present

## 2013-05-15 LAB — URINE MICROSCOPIC-ADD ON

## 2013-05-15 LAB — CBC WITH DIFFERENTIAL/PLATELET
Eosinophils Absolute: 0.2 10*3/uL (ref 0.0–0.7)
Eosinophils Relative: 2 % (ref 0–5)
HCT: 45.6 % (ref 36.0–46.0)
Hemoglobin: 15.4 g/dL — ABNORMAL HIGH (ref 12.0–15.0)
Lymphocytes Relative: 29 % (ref 12–46)
Lymphs Abs: 2.1 10*3/uL (ref 0.7–4.0)
MCH: 30.4 pg (ref 26.0–34.0)
MCV: 90.1 fL (ref 78.0–100.0)
Monocytes Absolute: 0.9 10*3/uL (ref 0.1–1.0)
Monocytes Relative: 12 % (ref 3–12)
Platelets: 196 10*3/uL (ref 150–400)
RBC: 5.06 MIL/uL (ref 3.87–5.11)
WBC: 7.5 10*3/uL (ref 4.0–10.5)

## 2013-05-15 LAB — COMPREHENSIVE METABOLIC PANEL
ALT: 23 U/L (ref 0–35)
BUN: 15 mg/dL (ref 6–23)
CO2: 29 mEq/L (ref 19–32)
Calcium: 9.4 mg/dL (ref 8.4–10.5)
Chloride: 100 mEq/L (ref 96–112)
Creatinine, Ser: 0.75 mg/dL (ref 0.50–1.10)
GFR calc Af Amer: >90 mL/min (ref >90)
GFR calc non Af Amer: 86 mL/min — ABNORMAL LOW (ref >90)
Glucose, Bld: 106 mg/dL — ABNORMAL HIGH (ref 70–99)
Sodium: 141 mEq/L (ref 135–145)

## 2013-05-15 LAB — URINALYSIS, ROUTINE W REFLEX MICROSCOPIC
Bilirubin Urine: NEGATIVE
Ketones, ur: NEGATIVE mg/dL
Nitrite: NEGATIVE
Specific Gravity, Urine: 1.015 (ref 1.005–1.030)
Urobilinogen, UA: 0.2 mg/dL (ref 0.0–1.0)

## 2013-05-15 LAB — PROTIME-INR: INR: 1.85 — ABNORMAL HIGH (ref 0.00–1.49)

## 2013-05-15 MED ORDER — SODIUM CHLORIDE 0.9 % IV BOLUS (SEPSIS)
1000.0000 mL | Freq: Once | INTRAVENOUS | Status: AC
  Administered 2013-05-15: 1000 mL via INTRAVENOUS

## 2013-05-15 NOTE — Consult Note (Signed)
[facility] Medical Consultation  AME HEAGLE UUV:[id number] DOB: 03-04-1946 DOA: 05/15/2013 PCP: Hoyle Sauer, MD   Requesting physician: Dr. Silverio Lay Date of consultation: 05/15/13 Reason for consultation: somnolence and weakness   Impression/Recommendations  1. Somnolence and weakness: secondary to extra baclofen effect received during refilling of her pump. Patient symptoms now improved and back to her baseline. Even husband at bedside was satisfied with the way she looks and responded to supportive care provided in ED. Patient had x-ray that has no show elevation of diaphragm or any acute cardiopulmonary process. *Patient will be discharge home from ED and she will follow with neurology service in outpatient setting.  Rest of medical problems remains stable and the plan is to continue current medication regimen and follow up with PCP for medication adjustments as needed.  Chief Complaint: somnolence and weakness.  HPI:  67 y/o female with significant PMH of MS, HTN, HLS, PAF and thyroid disease; came to the hospital secondary to increase weakness on her legs and somnolence. Patient reports symptoms started gradually after refilling her baclofen pump at her neurologist office today. Patient reported that during infusion she felt twice coldness sensation in the area of infusion (as if part of the medication has not entered the pump). She reports that approx 2 hours later she start feeling lethargic and having difficulty keeping herself awake. She called 911 and came to Ed for further evaluation and treatment. Patient denies slurred speech, focal side weakness, HA's, fever, chills, dysuria, N/V or any other complaints.  Review of Systems:  Negative except as mentioned on HPI  Past Medical History  Diagnosis Date  . Multiple sclerosis     has had this may 1989-[facility] reg doc  . Hypertension   . PAF (paroxysmal atrial fibrillation)     on coumadin; documented on monitor  06/2010  . Thyroid disease   . HLD (hyperlipidemia)   . Shortness of breath   . Pneumonia   . Sleep apnea   . Hyperthyroidism   . Edema     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' ablation of R GSV 2012  . Depression   . Anxiety   . Headache(784.0)     MIGRAINE   . Chronic pain   . Hx of echocardiogram 04/22/10    EF 55-60%, no valve issues  . History of stress test 06/21/10    limited exam with some degree of breast attenuatin of the apex however a component of apical ischemia is not excluded, EF 62%; cardiac cath    Past Surgical History  Procedure Laterality Date  . Cervical fusion      with correction-june 2005  . Infusion pump implantation      baclofen infusion in lower abd  . Venous ablation  12/16/10    radiofreq ablation -Dr Lynnea Ferrier and [facility]  . Back surgery    . Child births      X73  . Cardiac catheterization  06/22/10    normal coronary arteries, PAF   Social History:  reports that she has quit smoking. She has never used smokeless tobacco. She reports that  drinks alcohol. She reports that she does not use illicit drugs.  Allergies  Allergen Reactions  . Hydrocodone     Hydrocodone causes vomiting  . Oxycodone     Oral oxycodone causes vomiting  . Chlorhexidine Gluconate     Sores at site  . Sertraline Hcl   . Zoloft [Sertraline] Hives, Swelling and Rash  Family History  Problem Relation Age of Onset  . Coronary artery disease Father     at age 22  . Coronary artery disease Maternal Grandmother   . Depression Maternal Grandmother   . Cancer Paternal Grandmother   . Depression Mother     Prior to Admission medications   Medication Sig Start Date End Date Taking? Authorizing Provider  ALPRAZolam Prudy Feeler) 0.5 MG tablet Take 0.25-0.5 mg by mouth 2 (two) times daily as needed for anxiety.    Yes Historical Provider, MD  baclofen (LIORESAL) 20 MG tablet Take 20 mg by mouth 2 (two) times daily as needed (for pain). As needed   Yes  Historical Provider, MD  bethanechol (URECHOLINE) 25 MG tablet Take 25 mg by mouth daily. 03/12/13  Yes Historical Provider, MD  buPROPion (WELLBUTRIN XL) 300 MG 24 hr tablet Take 300 mg by mouth daily.   Yes Historical Provider, MD  calcium carbonate (OS-CAL) 600 MG TABS tablet Take 600 mg by mouth 2 (two) times daily with a meal.   Yes Historical Provider, MD  cephALEXin (KEFLEX) 250 MG capsule Take 250 mg by mouth at bedtime.   Yes Historical Provider, MD  cetirizine (ZYRTEC) 10 MG tablet Take 10 mg by mouth daily.   Yes Historical Provider, MD  cholecalciferol (VITAMIN D) 1000 UNITS tablet Take 1,000 Units by mouth daily.   Yes Historical Provider, MD  co-enzyme Q-10 30 MG capsule Take 30 mg by mouth daily.   Yes Historical Provider, MD  corticotropin (ACTHAR HP) 80 UNIT/ML injectable gel Inject 80 Units into the muscle as directed. Uses 5 injections per month, usually first 5 days of month   Yes Historical Provider, MD  furosemide (LASIX) 40 MG tablet Take 20 mg by mouth 2 (two) times daily.   Yes Historical Provider, MD  gabapentin (NEURONTIN) 300 MG capsule Take 300 mg by mouth 3 (three) times daily as needed. As needed   Yes Historical Provider, MD  [facility] 10 GM/15ML SOLN Take 10 mLs by mouth. 04/10/13  Yes Historical Provider, MD  Interferon Beta-1b (BETASERON) 0.3 MG KIT injection Inject 0.25 mg into the skin every other day. 03/12/13  Yes Carmen Dohmeier, MD  lactulose (CHRONULAC) 10 GM/15ML solution Take 10 g by mouth 2 (two) times daily.   Yes Historical Provider, MD  levothyroxine (SYNTHROID, LEVOTHROID) 75 MCG tablet Take 75 mcg by mouth daily.   Yes Historical Provider, MD  Multiple Vitamin (MULITIVITAMIN WITH MINERALS) TABS Take 1 tablet by mouth daily.   Yes Historical Provider, MD  Nebivolol HCl 20 MG TABS Take 20 mg by mouth daily.    Yes Historical Provider, MD  Omega-3 Fatty Acids (FISH OIL) 600 MG CAPS Take 1,200 mg by mouth daily.   Yes Historical Provider, MD  omeprazole  (PRILOSEC) 20 MG capsule Take 20 mg by mouth daily.    Yes Historical Provider, MD  ranitidine (ZANTAC) 150 MG tablet At bedtime 12/22/12  Yes Historical Provider, MD  simvastatin (ZOCOR) 20 MG tablet Take 20 mg by mouth at bedtime.    Yes Historical Provider, MD  Tamsulosin HCl (FLOMAX) 0.4 MG CAPS Take 0.4 mg by mouth daily.   Yes Historical Provider, MD  WARFARIN SODIUM PO Take 2.5-5 mg by mouth daily. Take 5mg  daily, except 2.5 on Mondays.   Yes Historical Provider, MD  Wheat Dextrin (BENEFIBER PO) Take 1 packet by mouth 2 (two) times daily.   Yes Historical Provider, MD   Physical Exam: Blood pressure 123/55, pulse 76, temperature  98.7 F (37.1 C), temperature source Oral, resp. rate 17, SpO2 97.00%. Filed Vitals:   05/15/13 2035  BP:   Pulse:   Temp: 98.7 F (37.1 C)  Resp:      General:  AAOX3, NAD, afebrile; patient able to follow commands and speaking in full sentences.  Eyes: PERRL, no icterus, no nystagmus; EOMI  ENT: no facial droop, tongue midline, MMM, no erythema or exudates inside her mouth  Neck: supple, no JVD, no thyromegaly   Cardiovascular: RRR, no rubs or gallops  Respiratory: CTA bilaterally  Abdomen: soft, obese, NT, ND, positive BS  Skin: no rash or petechiae  Musculoskeletal: no joint swelling or erythema  Psychiatric: no SI, no hallucinations  Neurologic: CN intact, right upper extremities with bilateral 5/5 strength; LE's with 4/5 on her left and 3/5 on her RLE (chronic from MS and at baseline currently per patient); normal finger to nose and no pronation drift  appreciated.  Labs on Admission:  Basic Metabolic Panel:  Recent Labs Lab 05/15/13 1722  NA 141  K 3.9  CL 100  CO2 29  GLUCOSE 106*  BUN 15  CREATININE 0.75  CALCIUM 9.4   Liver Function Tests:  Recent Labs Lab 05/15/13 1722  AST 21  ALT 23  ALKPHOS 60  BILITOT 0.3  PROT 6.7  ALBUMIN 3.7   CBC:  Recent Labs Lab 05/15/13 1722  WBC 7.5  NEUTROABS 4.2  HGB  15.4*  HCT 45.6  MCV 90.1  PLT 196    Radiological Exams on Admission: Dg Chest 2 View  05/15/2013   *RADIOLOGY REPORT*  Clinical Data: Shortness of breath, hypertension.  CHEST - 2 VIEW  Comparison: Dec 28, 2011.  Findings: Stable mild cardiomegaly.  No acute pulmonary disease is noted.  No pleural effusion or pneumothorax is noted.  Bony thorax is intact.  IMPRESSION: No acute cardiopulmonary abnormality seen.   Original Report Authenticated By: Lupita Raider.,  M.D.    EKG:  Rate: 70  Rhythm: normal sinus rhythm  QRS Axis: normal  Intervals: normal  ST/T Wave abnormalities: nonspecific ST changes  Conduction Disutrbances:none  Narrative Interpretation:  Old EKG Reviewed: unchanged  Time spent: 45 minutes  Kiira Brach [facility] Pager [phone]  If 7PM-7AM, please contact night-coverage [email or web address] Password [email or web address] 05/15/2013, 9:02 PM

## 2013-05-15 NOTE — ED Notes (Signed)
Gwenlyn Perking, MD at bedside to evaluate patient.

## 2013-05-15 NOTE — Telephone Encounter (Signed)
I called patient, left a message. The patient may have already gone to the emergency room. I am not clear what is going on. The pump refill was uneventful. 9 cc of baclofen was removed, the needle was in the port, and 40 cc of baclofen were inserted without difficulty. I am not completely certain what is going on with her. Going to the emergency room is reasonable at this point. Even if absorbed systemically, 40 cc of the intrathecal baclofen only has 80 mg a baclofen total.

## 2013-05-15 NOTE — Telephone Encounter (Signed)
Pt called and is having breathing difficulties, sounded breathless, lackadaisical.  She stated that for baclofen pump refill that it felt cool, like getting a shot.   I consulted Dr. Anne Hahn, re: sx and he relayed that pt having problems recommended going to ED.   I called pt back twice, and when I called third time, ambulance and Fire dept there.  Dr. Anne Hahn 100% sure that pump was refilled.

## 2013-05-15 NOTE — Progress Notes (Signed)
Pt performed NIF: 58cm H2O Pt performed VC: 1.72 liters

## 2013-05-15 NOTE — ED Notes (Signed)
Patient's pump brand is [employer], patient states a representative of the pump company remains on call.

## 2013-05-15 NOTE — ED Notes (Signed)
Per EMS patient with Hx of MS since 1989 saw Dr Anne Hahn at [facility] neurological this morning to refill her internal baclofen pump which she has had since 2009, when MD inserted second vial of baclofen patient felt cold sensation as if baclofen was leaking out of pump. EMS states that later in waiting room patient started feeling weak, lethargic, "droopy like I've had too much muscle relaxer," and called Anne Hahn, MD, who told her to visit ED. Patient is A & O x 4.

## 2013-05-15 NOTE — ED Notes (Signed)
Dr. Silverio Lay at bedside speaking with patient.

## 2013-05-15 NOTE — Progress Notes (Signed)
  History:  Toni [name] is a 67 year old white female with a history of multiple sclerosis. The patient has a baclofen pump in place, and she returns for a refill. Since last seen, the patient indicates that the pump settings are adequate for her. She denies any significant problems with spasms of the legs or abdomen. The patient is on ACTHAR, and she seems to be doing well with this.  Baclofen pump refill note  The baclofen pump site was cleaned with Betadine solution. A 21-gauge needle was inserted into the pump port site. Approximately 9 cc of residual baclofen was removed. 40 cc of replacement baclofen was placed into the pump at 2000 mcg/cc concentration.  The pump was reprogrammed for the following settings: 770.9 micrograms daily of baclofen, with the basal rate of 34.0 mcg per hour. Between 1 AM and 5 AM, the rate goes to 25 mcg per hour with a total dose over this period of time of 124.9 mcg.  The alarm volume is set at 1.5 cc. The next alarm date is 08/22/2013.  The patient tolerated the procedure well. There were no complications of the above procedure.

## 2013-05-15 NOTE — ED Notes (Signed)
Bed: WA25 Expected date:  Expected time:  Means of arrival:  Comments: ems  

## 2013-05-15 NOTE — ED Provider Notes (Addendum)
CSN: [id number]     Arrival date & time 05/15/13  1651 History   First MD Initiated Contact with Patient 05/15/13 1704     Chief Complaint  Patient presents with  . Weakness   (Consider location/radiation/quality/duration/timing/severity/associated sxs/prior Treatment) The history is provided by the patient.  Toni [name] is a 67 y.o. female hx of multiple sclerosis, HTN, HL, pneumonia here with weakness. She went to her neurologist office today to refill her baclofen pump. She was injected with two vials (60 cc with total of 80 mg). She felt cool sensation afterwards. She went home and was playing cards and felt sleepy and was sent for evaluation. Denies taking narcotics today or doing any drugs. Has baclofen pump for several years and has refill every 2 months without these symptoms. Denies fevers or chills or cough.    Past Medical History  Diagnosis Date  . Multiple sclerosis     has had this may 1989-[facility] reg doc  . Hypertension   . PAF (paroxysmal atrial fibrillation)     on coumadin; documented on monitor 06/2010  . Thyroid disease   . HLD (hyperlipidemia)   . Shortness of breath   . Pneumonia   . Sleep apnea   . Hyperthyroidism   . Edema     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' ablation of R GSV 2012  . Depression   . Anxiety   . Headache(784.0)     MIGRAINE   . Chronic pain   . Hx of echocardiogram 04/22/10    EF 55-60%, no valve issues  . History of stress test 06/21/10    limited exam with some degree of breast attenuatin of the apex however a component of apical ischemia is not excluded, EF 62%; cardiac cath    Past Surgical History  Procedure Laterality Date  . Cervical fusion      with correction-june 2005  . Infusion pump implantation      baclofen infusion in lower abd  . Venous ablation  12/16/10    radiofreq ablation -Dr Lynnea Ferrier and [facility]  . Back surgery    . Child births      X59  . Cardiac catheterization  06/22/10    normal  coronary arteries, PAF   Family History  Problem Relation Age of Onset  . Coronary artery disease Father     at age 33  . Coronary artery disease Maternal Grandmother   . Depression Maternal Grandmother   . Cancer Paternal Grandmother   . Depression Mother    History  Substance Use Topics  . Smoking status: Former Games developer  . Smokeless tobacco: Never Used     Comment: QUIT 1978  . Alcohol Use: Yes     Comment: SOCIALLY   OB History   Grav Para Term Preterm Abortions TAB SAB Ect Mult Living                 Review of Systems  Neurological: Positive for weakness.  All other systems reviewed and are negative.    Allergies  Hydrocodone; Oxycodone; Chlorhexidine gluconate; Sertraline hcl; and Zoloft  Home Medications   Current Outpatient Rx  Name  Route  Sig  Dispense  Refill  . ALPRAZolam (XANAX) 0.5 MG tablet   Oral   Take 0.25-0.5 mg by mouth 2 (two) times daily as needed for anxiety.          . baclofen (LIORESAL) 20 MG tablet   Oral  Take 20 mg by mouth 2 (two) times daily as needed (for pain). As needed         . bethanechol (URECHOLINE) 25 MG tablet   Oral   Take 25 mg by mouth daily.         Marland Kitchen buPROPion (WELLBUTRIN XL) 300 MG 24 hr tablet   Oral   Take 300 mg by mouth daily.         . calcium carbonate (OS-CAL) 600 MG TABS tablet   Oral   Take 600 mg by mouth 2 (two) times daily with a meal.         . cephALEXin (KEFLEX) 250 MG capsule   Oral   Take 250 mg by mouth at bedtime.         . cetirizine (ZYRTEC) 10 MG tablet   Oral   Take 10 mg by mouth daily.         . cholecalciferol (VITAMIN D) 1000 UNITS tablet   Oral   Take 1,000 Units by mouth daily.         Marland Kitchen co-enzyme Q-10 30 MG capsule   Oral   Take 30 mg by mouth daily.         . corticotropin (ACTHAR HP) 80 UNIT/ML injectable gel   Intramuscular   Inject 80 Units into the muscle as directed. Uses 5 injections per month, usually first 5 days of month         .  furosemide (LASIX) 40 MG tablet   Oral   Take 20 mg by mouth 2 (two) times daily.         Marland Kitchen gabapentin (NEURONTIN) 300 MG capsule   Oral   Take 300 mg by mouth 3 (three) times daily as needed. As needed         . GENERLAC 10 GM/15ML SOLN   Oral   Take 10 mLs by mouth.         . Interferon Beta-1b (BETASERON) 0.3 MG KIT injection   Subcutaneous   Inject 0.25 mg into the skin every other day.   1 each   12   . lactulose (CHRONULAC) 10 GM/15ML solution   Oral   Take 10 g by mouth 2 (two) times daily.         Marland Kitchen levothyroxine (SYNTHROID, LEVOTHROID) 75 MCG tablet   Oral   Take 75 mcg by mouth daily.         . Multiple Vitamin (MULITIVITAMIN WITH MINERALS) TABS   Oral   Take 1 tablet by mouth daily.         . Nebivolol HCl 20 MG TABS   Oral   Take 20 mg by mouth daily.          . Omega-3 Fatty Acids (FISH OIL) 600 MG CAPS   Oral   Take 1,200 mg by mouth daily.         Marland Kitchen omeprazole (PRILOSEC) 20 MG capsule   Oral   Take 20 mg by mouth daily.          . ranitidine (ZANTAC) 150 MG tablet      At bedtime         . simvastatin (ZOCOR) 20 MG tablet   Oral   Take 20 mg by mouth at bedtime.          . Tamsulosin HCl (FLOMAX) 0.4 MG CAPS   Oral   Take 0.4 mg by mouth daily.         Marland Kitchen  WARFARIN SODIUM PO   Oral   Take 2.5-5 mg by mouth daily. Take 5mg  daily, except 2.5 on Mondays.         . Wheat Dextrin (BENEFIBER PO)   Oral   Take 1 packet by mouth 2 (two) times daily.          BP 123/55  Pulse 76  Temp(Src) 98.7 F (37.1 C) (Oral)  Resp 17  SpO2 97% Physical Exam  Nursing note and vitals reviewed. Constitutional: She is oriented to person, place, and time.  Tired, sometimes falls asleep during exam. Protecting her airway.   HENT:  Head: Normocephalic.  Mouth/Throat: Oropharynx is clear and moist.  Eyes: Conjunctivae are normal. Pupils are equal, round, and reactive to light.  Neck: Normal range of motion. Neck supple.   Cardiovascular: Normal rate and regular rhythm.   Pulmonary/Chest: Effort normal and breath sounds normal. No respiratory distress. She has no wheezes. She has no rales.  Abdominal: Soft. Bowel sounds are normal. She exhibits no distension. There is no tenderness. There is no rebound and no guarding.  Pump palpable. No obvious fluid collection.   Musculoskeletal: Normal range of motion.  Neurological: She is alert and oriented to person, place, and time.  Sleepy, strength 4/5 throughout.   Skin: Skin is warm and dry.  Psychiatric: She has a normal mood and affect. Her behavior is normal. Judgment and thought content normal.    ED Course  Procedures (including critical care time) Labs Review Labs Reviewed  CBC WITH DIFFERENTIAL - Abnormal; Notable for the following:    Hemoglobin 15.4 (*)    All other components within normal limits  COMPREHENSIVE METABOLIC PANEL - Abnormal; Notable for the following:    Glucose, Bld 106 (*)    GFR calc non Af Amer 86 (*)    All other components within normal limits  URINALYSIS, ROUTINE W REFLEX MICROSCOPIC - Abnormal; Notable for the following:    APPearance CLOUDY (*)    Leukocytes, UA SMALL (*)    All other components within normal limits  PROTIME-INR - Abnormal; Notable for the following:    Prothrombin Time 20.8 (*)    INR 1.85 (*)    All other components within normal limits  URINE MICROSCOPIC-ADD ON - Abnormal; Notable for the following:    Squamous Epithelial / LPF MANY (*)    Bacteria, UA MANY (*)    All other components within normal limits  URINE CULTURE  URINE RAPID DRUG SCREEN (HOSP PERFORMED)     Date: 05/15/2013  Rate: 70  Rhythm: normal sinus rhythm  QRS Axis: normal  Intervals: normal  ST/T Wave abnormalities: nonspecific ST changes  Conduction Disutrbances:none  Narrative Interpretation:   Old EKG Reviewed: unchanged   Imaging Review Dg Chest 2 View  05/15/2013   *RADIOLOGY REPORT*  Clinical Data: Shortness of  breath, hypertension.  CHEST - 2 VIEW  Comparison: Dec 28, 2011.  Findings: Stable mild cardiomegaly.  No acute pulmonary disease is noted.  No pleural effusion or pneumothorax is noted.  Bony thorax is intact.  IMPRESSION: No acute cardiopulmonary abnormality seen.   Original Report Authenticated By: Lupita Raider.,  M.D.    MDM  No diagnosis found. Toni [name] is a 67 y.o. female here with weakness and fatigue after baclofen pump refill. Will call neuro regarding baclofen pump and possible overdose.   6 PM I called neuro hospitalist, Dr. Thad Ranger, who recommend that I call the [facility]. I called the emergency  line and discussed with Bonita Quin. She states that Dr. Vaughan Basta is on call but as per protocol I have to talk to neuro hospitalist and wouldn't let me talk to Dr. Vaughan Basta. I called poison control instead for further management.   8:50 PM I called poison control. Recommend supportive treatment. NIF -60 so I doubt MS exacerbation. I called hospitalist, Dr. Gwenlyn Perking, who will admit the patient for observation.   9 PM Dr. Gwenlyn Perking saw patient. Patient now awake and alert. Wants to go home. Given that her mental status improved, stable for d/c. Will f/u with Dr. Anne Hahn outpatient.    Richardean Canal, MD 05/15/13 2052  Richardean Canal, MD 05/15/13 [phone]

## 2013-05-16 ENCOUNTER — Telehealth: Payer: Self-pay | Admitting: Pharmacist Clinician (PhC)/ Clinical Pharmacy Specialist

## 2013-05-16 ENCOUNTER — Telehealth: Payer: Self-pay | Admitting: Neurology

## 2013-05-16 LAB — URINE CULTURE

## 2013-05-16 NOTE — Telephone Encounter (Signed)
Noted. I did not call the patient. 

## 2013-05-16 NOTE — Telephone Encounter (Signed)
Advised pt to take extra 1/2 tablet today Friday Sept 26, then resume previous dose, has INR appt for 10/15.  Will keep that appt, pt to call if concerns before then.

## 2013-05-16 NOTE — Telephone Encounter (Signed)
Received call from Toni [name] who expresses concern that she is having severe spasms and feels she is not getting any baclofen. I counseled her to try taking an oral baclofen or a valium and see if this relieves the symptoms. She states she feels she is going into withdrawal and has never felt like this before. I instructed her to go to the ER and be evaluated if she is concerned she may be going into baclofen withdrawal. Patient will try oral medication and wait to see if any benefit.

## 2013-05-16 NOTE — Telephone Encounter (Signed)
Patient states she went to the Er last night due to some issues she was having with her "medication pump"  Her INR was 1.85.   Please advise

## 2013-05-19 ENCOUNTER — Ambulatory Visit (INDEPENDENT_AMBULATORY_CARE_PROVIDER_SITE_OTHER): Payer: Medicare Other | Admitting: Neurology

## 2013-05-19 ENCOUNTER — Telehealth: Payer: Self-pay | Admitting: *Deleted

## 2013-05-19 VITALS — BP 122/82 | HR 66 | Temp 98.8°F

## 2013-05-19 DIAGNOSIS — G35 Multiple sclerosis: Secondary | ICD-10-CM | POA: Diagnosis not present

## 2013-05-19 DIAGNOSIS — G35D Multiple sclerosis, unspecified: Secondary | ICD-10-CM | POA: Diagnosis not present

## 2013-05-19 DIAGNOSIS — Z5189 Encounter for other specified aftercare: Secondary | ICD-10-CM | POA: Diagnosis not present

## 2013-05-19 DIAGNOSIS — R269 Unspecified abnormalities of gait and mobility: Secondary | ICD-10-CM | POA: Diagnosis not present

## 2013-05-19 DIAGNOSIS — N319 Neuromuscular dysfunction of bladder, unspecified: Secondary | ICD-10-CM | POA: Diagnosis not present

## 2013-05-19 DIAGNOSIS — Z7901 Long term (current) use of anticoagulants: Secondary | ICD-10-CM | POA: Diagnosis not present

## 2013-05-19 DIAGNOSIS — I4891 Unspecified atrial fibrillation: Secondary | ICD-10-CM | POA: Diagnosis not present

## 2013-05-19 NOTE — Telephone Encounter (Signed)
I called pt and she has had since Friday afternoon (4-5pm) strong muscle spasms .  Tightness in feet, abd, diaphragm, did speak to Dr. Hosie Poisson on Friday night,  and has been taking baclofen 3-4 tabs/ day.  Still with tightness.  She stated no infection from ED visit.  Pump working.   It is not beeping.  Labs done in ED ok.

## 2013-05-19 NOTE — Procedures (Signed)
  History:  Toni [name] is a 67 year old patient with a history of multiple sclerosis. The patient had a baclofen pump refill done on 05/15/2013. Later that day, the patient began to feel lethargic and sleepy. The patient has indicated that over the next several days, the spasticity has increased in her legs. The patient underwent evaluation through the emergency room, and blood work was done without evidence of a urinary tract infection, or electrolyte abnormalities. The patient also had a chest x-ray that was clear. The patient indicates that she is now feeling more fatigued, with burning sensations around the lips and around the legs, and tight sensation around the abdomen. The patient believes that she is running a low-grade fever. The patient returns for interrogation of her baclofen pump.  Baclofen pump refill note  The baclofen pump was interrogated. The settings are difficult to what was found last visit on 05/15/2013. The baclofen dosing is 770.9 mcg per day, with a basal rate of 34 mcg per hour. Between the hours of 1 AM and 5 AM, the patient receives 124.9 mcg of baclofen. The alarm volume is at 1.5 cc, with the next refill date of 08/22/2013.  The patient tolerated the procedure well. There were no complications of the above procedure.  Is felt that the patient may be having an exacerbation of her multiple sclerosis. I have indicated that she is to start prednisone at 60 mg daily, tapering by 10 mg daily until off the medication.

## 2013-05-19 NOTE — Progress Notes (Signed)
The patient was brought into the office today for increased symptoms of spasticity. The baclofen pump was interrogated. Please see the note.

## 2013-05-19 NOTE — Telephone Encounter (Signed)
Pt calling.  Having strong muscle spasms, has been taking baclofen 20mg  2 -4 pills daily.   Would like to be checked and see if medication in her pump.

## 2013-05-19 NOTE — Telephone Encounter (Signed)
I called the patient. The patient indicates that her spasticity has worsened since the pump refill. I'll have the patient come in to reinterrogate the pump. The pump has not reset, I will go up on the rate of the baclofen infusion.

## 2013-05-21 DIAGNOSIS — Z7901 Long term (current) use of anticoagulants: Secondary | ICD-10-CM | POA: Diagnosis not present

## 2013-05-21 DIAGNOSIS — N319 Neuromuscular dysfunction of bladder, unspecified: Secondary | ICD-10-CM | POA: Diagnosis not present

## 2013-05-21 DIAGNOSIS — G35 Multiple sclerosis: Secondary | ICD-10-CM | POA: Diagnosis not present

## 2013-05-21 DIAGNOSIS — Z5189 Encounter for other specified aftercare: Secondary | ICD-10-CM | POA: Diagnosis not present

## 2013-05-21 DIAGNOSIS — R269 Unspecified abnormalities of gait and mobility: Secondary | ICD-10-CM | POA: Diagnosis not present

## 2013-05-21 DIAGNOSIS — I4891 Unspecified atrial fibrillation: Secondary | ICD-10-CM | POA: Diagnosis not present

## 2013-05-22 ENCOUNTER — Ambulatory Visit: Payer: Self-pay | Admitting: Neurology

## 2013-05-23 DIAGNOSIS — N312 Flaccid neuropathic bladder, not elsewhere classified: Secondary | ICD-10-CM | POA: Diagnosis not present

## 2013-05-23 DIAGNOSIS — G35 Multiple sclerosis: Secondary | ICD-10-CM | POA: Diagnosis not present

## 2013-05-23 DIAGNOSIS — N390 Urinary tract infection, site not specified: Secondary | ICD-10-CM | POA: Diagnosis not present

## 2013-05-26 ENCOUNTER — Telehealth: Payer: Self-pay | Admitting: Neurology

## 2013-05-26 NOTE — Telephone Encounter (Signed)
  Home health order has to be searched under home health needs. CD

## 2013-05-27 DIAGNOSIS — Z5189 Encounter for other specified aftercare: Secondary | ICD-10-CM | POA: Diagnosis not present

## 2013-05-27 DIAGNOSIS — N319 Neuromuscular dysfunction of bladder, unspecified: Secondary | ICD-10-CM | POA: Diagnosis not present

## 2013-05-27 DIAGNOSIS — G35 Multiple sclerosis: Secondary | ICD-10-CM | POA: Diagnosis not present

## 2013-05-27 DIAGNOSIS — Z7901 Long term (current) use of anticoagulants: Secondary | ICD-10-CM | POA: Diagnosis not present

## 2013-05-27 DIAGNOSIS — R269 Unspecified abnormalities of gait and mobility: Secondary | ICD-10-CM | POA: Diagnosis not present

## 2013-05-27 DIAGNOSIS — I4891 Unspecified atrial fibrillation: Secondary | ICD-10-CM | POA: Diagnosis not present

## 2013-05-29 ENCOUNTER — Ambulatory Visit: Payer: Medicare Other | Attending: Neurology | Admitting: Physical Therapy

## 2013-05-30 ENCOUNTER — Telehealth: Payer: Self-pay | Admitting: *Deleted

## 2013-05-30 DIAGNOSIS — Z5189 Encounter for other specified aftercare: Secondary | ICD-10-CM | POA: Diagnosis not present

## 2013-05-30 DIAGNOSIS — Z7901 Long term (current) use of anticoagulants: Secondary | ICD-10-CM | POA: Diagnosis not present

## 2013-05-30 DIAGNOSIS — N319 Neuromuscular dysfunction of bladder, unspecified: Secondary | ICD-10-CM | POA: Diagnosis not present

## 2013-05-30 DIAGNOSIS — R269 Unspecified abnormalities of gait and mobility: Secondary | ICD-10-CM | POA: Diagnosis not present

## 2013-05-30 DIAGNOSIS — I4891 Unspecified atrial fibrillation: Secondary | ICD-10-CM | POA: Diagnosis not present

## 2013-05-30 DIAGNOSIS — G35 Multiple sclerosis: Secondary | ICD-10-CM | POA: Diagnosis not present

## 2013-05-30 NOTE — Telephone Encounter (Signed)
Take acthar every other day , not in a row.

## 2013-05-30 NOTE — Telephone Encounter (Signed)
I started my acthar shots two days ago and I have a fever today. I want to know if I should finish the remainder 5 shot or discontinue. I may have a bladder infection as well. Please call and advise.

## 2013-06-02 ENCOUNTER — Telehealth: Payer: Self-pay

## 2013-06-02 NOTE — Telephone Encounter (Signed)
I called patient and shared with her Dr. Oliva Bustard recommendation. Patient stated she continued her actar as previously ordered but, she will skip one day and complete the last dose. She was surprised that she did not get a call on Friday. I let her know the call was flagged for me to respond to. Unfortunately, I was giving staff flu vaccines all day and did not go into the phone calls to know there was a call for me to return. I apologized and patient acknowledged my inability to be in two places at one time and appreciated my forth-rightness.

## 2013-06-04 ENCOUNTER — Ambulatory Visit (INDEPENDENT_AMBULATORY_CARE_PROVIDER_SITE_OTHER): Payer: Medicare Other | Admitting: Pharmacist Clinician (PhC)/ Clinical Pharmacy Specialist

## 2013-06-04 ENCOUNTER — Ambulatory Visit (INDEPENDENT_AMBULATORY_CARE_PROVIDER_SITE_OTHER): Payer: Medicare Other | Admitting: Internal Medicine

## 2013-06-04 ENCOUNTER — Ambulatory Visit: Payer: Medicare Other | Admitting: Pharmacist Clinician (PhC)/ Clinical Pharmacy Specialist

## 2013-06-04 ENCOUNTER — Encounter: Payer: Self-pay | Admitting: Internal Medicine

## 2013-06-04 VITALS — BP 120/80 | HR 74 | Ht 63.0 in | Wt 265.0 lb

## 2013-06-04 DIAGNOSIS — I83893 Varicose veins of bilateral lower extremities with other complications: Secondary | ICD-10-CM | POA: Insufficient documentation

## 2013-06-04 DIAGNOSIS — I4891 Unspecified atrial fibrillation: Secondary | ICD-10-CM

## 2013-06-04 DIAGNOSIS — E785 Hyperlipidemia, unspecified: Secondary | ICD-10-CM

## 2013-06-04 DIAGNOSIS — Z7901 Long term (current) use of anticoagulants: Secondary | ICD-10-CM

## 2013-06-04 DIAGNOSIS — I480 Paroxysmal atrial fibrillation: Secondary | ICD-10-CM | POA: Insufficient documentation

## 2013-06-04 DIAGNOSIS — I10 Essential (primary) hypertension: Secondary | ICD-10-CM

## 2013-06-04 DIAGNOSIS — G4733 Obstructive sleep apnea (adult) (pediatric): Secondary | ICD-10-CM | POA: Insufficient documentation

## 2013-06-04 LAB — POCT INR: INR: 1.9

## 2013-06-04 NOTE — Patient Instructions (Addendum)
Baxter Hire will call you with any changes necessary to your coumadin  Your physician wants you to follow-up in: 6 months. You will receive a reminder letter in the mail two months in advance. If you don't receive a letter, please call our office to schedule the follow-up appointment.

## 2013-06-04 NOTE — Progress Notes (Signed)
OFFICE NOTE  Chief Complaint:  Routine follow-up  Primary Care Physician: Hoyle Sauer, MD  HPI:  Toni [name] is a 67 year old female with multiple sclerosis, paroxysmal atrial fibrillation, on Coumadin, morbid obesity, obstructive sleep apnea, and dyslipidemia. She has had worsening swelling in her legs, thought to be due to an MS flare. She has a history of varicose veins with severe venous insufficiency in both the right and left greater saphenous veins. The right greater saphenous vein was actually successfully ablated by my partner, Dr. Lynnea Ferrier, about 1 to 2 years ago. She does have residual reflux in the left greater saphenous vein extending from the proximal calf up to the saphenofemoral junction. The vein measured between 4 and 8 mm with between 1 and 6 seconds of reflux. We had discussed ablation of that before, but she has canceled several times at procedures. At this point, I think there is little benefit to continuing to try for her to undergo ablation. 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 She can wear work showed a compression stockings and this does seem to help her. With regards to her atrial fibrillation she has not had significant recurrence to her knowledge. She continues on warfarin and has been therapeutic, with her INR of 1.9 in the office by fingerstick today.  PMHx:  Past Medical History  Diagnosis Date  . Multiple sclerosis     has had this may 1989-[facility] reg doc  . Hypertension   . PAF (paroxysmal atrial fibrillation)     on coumadin; documented on monitor 06/2010  . Thyroid disease   . HLD (hyperlipidemia)   . Shortness of breath   . Pneumonia   . Sleep apnea   . Hyperthyroidism   . Edema     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' ablation of R GSV 2012  . Depression   . Anxiety   . Headache(784.0)     MIGRAINE   . Chronic pain   . Hx of echocardiogram 04/22/10    EF 55-60%,  no valve issues  . History of stress test 06/21/10    limited exam with some degree of breast attenuatin of the apex however a component of apical ischemia is not excluded, EF 62%; cardiac cath     Past Surgical History  Procedure Laterality Date  . Cervical fusion      with correction-june 2005  . Infusion pump implantation      baclofen infusion in lower abd  . Venous ablation  12/16/10    radiofreq ablation -Dr Lynnea Ferrier and [facility]  . Back surgery    . Child births      X98  . Cardiac catheterization  06/22/10    normal coronary arteries, PAF    FAMHx:  Family History  Problem Relation Age of Onset  . Coronary artery disease Father     at age 25  . Coronary artery disease Maternal Grandmother   . Depression Maternal Grandmother   . Cancer Paternal Grandmother   . Depression Mother     SOCHx:   reports that she quit smoking about 36 years ago. She has never used smokeless tobacco. She reports that she drinks alcohol. She reports that she does not use illicit drugs.  ALLERGIES:  Allergies  Allergen Reactions  . Hydrocodone     Hydrocodone causes vomiting  . Oxycodone     Oral oxycodone causes vomiting  . Chlorhexidine Gluconate  Sores at site  . Zoloft [Sertraline] Hives, Swelling and Rash    ROS: A comprehensive review of systems was negative except for: Constitutional: positive for fatigue Respiratory: positive for dyspnea on exertion Cardiovascular: positive for lower extremity edema and palpitations Neurological: positive for neuropathy  HOME MEDS: Current Outpatient Prescriptions  Medication Sig Dispense Refill  . ALPRAZolam (XANAX) 0.5 MG tablet Take 0.5 mg by mouth 2 (two) times daily as needed for anxiety.       . baclofen (LIORESAL) 20 MG tablet Take 20 mg by mouth 2 (two) times daily as needed (for pain). As needed      . bethanechol (URECHOLINE) 25 MG tablet Take 25 mg by mouth daily.      Marland Kitchen buPROPion (WELLBUTRIN XL) 300 MG 24 hr tablet Take 300 mg  by mouth daily.      . calcium carbonate (OS-CAL) 600 MG TABS tablet Take 600 mg by mouth 2 (two) times daily with a meal.      . cephALEXin (KEFLEX) 250 MG capsule Take 250 mg by mouth at bedtime.      . cetirizine (ZYRTEC) 10 MG tablet Take 10 mg by mouth daily.      Marland Kitchen co-enzyme Q-10 30 MG capsule Take 30 mg by mouth daily.      . corticotropin (ACTHAR HP) 80 UNIT/ML injectable gel Inject 80 Units into the muscle as directed. Uses 5 injections per month, usually first 5 days of month      . furosemide (LASIX) 40 MG tablet Take 20 mg by mouth 2 (two) times daily.      Marland Kitchen gabapentin (NEURONTIN) 300 MG capsule Take 300 mg by mouth 3 (three) times daily as needed. As needed      . lactulose (CHRONULAC) 10 GM/15ML solution Take 10 g by mouth 2 (two) times daily.      Marland Kitchen levothyroxine (SYNTHROID, LEVOTHROID) 75 MCG tablet Take 75 mcg by mouth daily.      . Multiple Vitamin (MULITIVITAMIN WITH MINERALS) TABS Take 1 tablet by mouth daily.      . Nebivolol HCl 20 MG TABS Take 20 mg by mouth daily.       . Omega-3 Fatty Acids (FISH OIL) 600 MG CAPS Take 1,200 mg by mouth daily.      Marland Kitchen omeprazole (PRILOSEC) 20 MG capsule Take 20 mg by mouth daily.       . ranitidine (ZANTAC) 150 MG tablet At bedtime      . simvastatin (ZOCOR) 20 MG tablet Take 20 mg by mouth at bedtime.       . Tamsulosin HCl (FLOMAX) 0.4 MG CAPS Take 0.4 mg by mouth daily.      . WARFARIN SODIUM PO Take 2.5-5 mg by mouth daily. Take 5mg  daily, except 2.5 on Mondays.      . Wheat Dextrin (BENEFIBER PO) Take 1 packet by mouth 2 (two) times daily.       No current facility-administered medications for this visit.    LABS/IMAGING: No results found for this or any previous visit (from the past 48 hour(s)). No results found.  VITALS: BP 120/80  Pulse 74  Ht 5\' 3"  (1.6 m)  Wt 265 lb (120.203 kg)  BMI 46.95 kg/m2  EXAM: General appearance: alert and no distress Lungs: clear to auscultation bilaterally Heart: regular rate and  rhythm, S1, S2 normal, no murmur, click, rub or gallop Abdomen: soft, non-tender; bowel sounds normal; no masses,  no organomegaly and morbidly obese Extremities: edema trace  bilateral and varicose veins noted Pulses: 2+ and symmetric  EKG: Ectopic atrial rhythm at 74  ASSESSMENT: 1. Paroxysmal atrial fibrillation 2. Morbid obesity 3. Obstructive sleep apnea on CPAP 4. Multiple sclerosis 5. Chronic anticoagulation on warfarin - INR 1.9 today 6. Chronic venous insufficiency 7. Hypertension-controlled  PLAN: 1.   Mrs. Simonet is doing fairly well and she says that her MS is possibly more of her relapsing-remitting type at this point. She reported that she was on a new were "old" medicine which can cause up to $100,000. She seems to be tolerating this and has had less frequent MS flares. She's had no recurrence of atrial fibrillation that she is aware of and remains anticoagulated on warfarin. We did not adjust her dose today as she is slightly out of range. Her blood pressure is at goal and I've encouraged her to continue to wear lower extremity stockings for her venous reflux. I do not wish to pursue venous ablation any further. Plan to see her back in 6 months or sooner as necessary.  Chrystie Nose, MD, [facility] Attending Cardiologist [facility]  HILTY,Kenneth C 06/04/2013, 1:31 PM

## 2013-06-05 ENCOUNTER — Encounter: Payer: Self-pay | Admitting: Cardiology

## 2013-06-05 ENCOUNTER — Encounter: Payer: Self-pay | Admitting: Internal Medicine

## 2013-06-06 ENCOUNTER — Telehealth: Payer: Self-pay | Admitting: Pharmacist Clinician (PhC)/ Clinical Pharmacy Specialist

## 2013-06-06 NOTE — Telephone Encounter (Signed)
Will leave warfarin dose as is, INR scheduled for Nov 12.  Pt to restart MVI on Nov 1

## 2013-06-06 NOTE — Telephone Encounter (Signed)
She was in Wednesday-her INR was  1.9.Does she need to change anything?i

## 2013-06-11 ENCOUNTER — Ambulatory Visit (INDEPENDENT_AMBULATORY_CARE_PROVIDER_SITE_OTHER): Payer: Medicare Other | Admitting: Neurology

## 2013-06-11 ENCOUNTER — Encounter: Payer: Self-pay | Admitting: Neurology

## 2013-06-11 ENCOUNTER — Telehealth: Payer: Self-pay | Admitting: Neurology

## 2013-06-11 VITALS — BP 121/77 | HR 77 | Wt 268.0 lb

## 2013-06-11 DIAGNOSIS — G35 Multiple sclerosis: Secondary | ICD-10-CM | POA: Diagnosis not present

## 2013-06-11 DIAGNOSIS — G4733 Obstructive sleep apnea (adult) (pediatric): Secondary | ICD-10-CM | POA: Diagnosis not present

## 2013-06-11 DIAGNOSIS — Z5181 Encounter for therapeutic drug level monitoring: Secondary | ICD-10-CM | POA: Diagnosis not present

## 2013-06-11 NOTE — Progress Notes (Signed)
[facility]  Provider:  Melvyn Novas, M D  Referring Provider: Hoyle Sauer, MD Primary Care Physician:  Hoyle Sauer, MD  Chief Complaint  Patient presents with  . Multiple Sclerosis    pt's vision was not conducted at this visit. Pt was unable to stand for a short period of time    HPI:  Toni [name] is a 67 y.o. female  Is seen here as a referral/ revisit  from Dr. Felipa [name] for 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 Noreene [name] [phone]) with Rebif, later changed under Dr. Oliva [name] guidance to copaxone and later to  Betaseron and than - finally- Tysabri . Tysabri was discontinued 2010, since 2010 she has noticed a slow rather than stepwise decline but she seems to have had at least one MS relapse per anno , that she still differentiate from her otherwise progressive disease.  Due to treatments with steroids pulse steroids as well as oral taper, she has gained a lot of weight and this induced in addition sleep apnea. She also has a neurogenic bladder and had often troubled his UTIs in the past. She is treated for her sleep apnea the CPAP and he was is this for about 6 hours nightly but download dated 02/06/2013 shows an average daily usage of 7 hours and 40 minutes at a pressure of 12 cm water and an apnea index of 8.7 she also has a rather high air leak. I would like for the patient is a high residual AHI of 21.8 to use an hour to titrate her between 6 and 15 cm water. I would generate the order today. [facility] [employer].  MS related truncal and abdominal spasms , she has multiple brain lesions on her followup MRIs, but no spinal lesions. In 2009 she begun using a baclofen pump initially with wonderful results ,  but unfortunately adjustments and doors and pulse doses, has not Been able reciprocated the same effect  on her. Baclofen pump used to be followed by Dr. Sharene [name] and is now followed by Dr. Anne [name].  A baclofen pump check and refill was performed on Sept 27 th 2014 with Dr. Anne [name].    . Toni [name] is concerned about the recent prescription of Acthar  , as she has experienced atrial fibrillation under Solu-Medrol pulse therapy in the past and has had high blood pressures as well as high blood sugars.  The ID of the precursor hormone being used was that this would be less likely to cause hypertensive crisis or hyperglycemia. A 15 vial medication package has been delivered to Toni [name]'s home but the companies 's nurse has not been able to come by .  Toni [name] also reports that the prescription brought for intramuscular injection and not subcutaneous as is normally used for ACTH.  I change her to subcutaneous pathway for ACTHAR. She tolerates this medication very well , but she is concerned and saddend by it's price. Its a gel medication and causes some bruises , but no serious defects.      Review of Systems: Out of a complete 14 system review, the patient complains of only the following symptoms, and all other reviewed systems are negative. No longer ambulatory .  History   Social History  . Marital Status: Married    Spouse Name: N/A    Number  of Children: 2  . Years of Education: COLLEGE   Occupational History  . RETIRED    Social History Main Topics  . Smoking status: Former Smoker    Quit date: 08/21/1976  . Smokeless tobacco: Never Used     Comment: QUIT 1978  . Alcohol Use: Yes     Comment: SOCIALLY  . Drug Use: No  . Sexual Activity: Yes   Other Topics Concern  . Not on file   Social History Narrative   Lives in [place] with Toni [name] [phone], [phone]          Family History  Problem Relation Age of Onset  . Coronary artery disease Father     at age 45  . Coronary artery disease Maternal Grandmother   . Depression Maternal Grandmother   . Cancer Paternal  Grandmother   . Depression Mother     Past Medical History  Diagnosis Date  . Multiple sclerosis     has had this may 1989-[facility] reg doc  . Hypertension   . PAF (paroxysmal atrial fibrillation)     on coumadin; documented on monitor 06/2010  . Thyroid disease   . HLD (hyperlipidemia)   . Shortness of breath   . Pneumonia   . Sleep apnea   . Hyperthyroidism   . Edema     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' ablation of R GSV 2012  . Depression   . Anxiety   . Headache(784.0)     MIGRAINE   . Chronic pain   . Hx of echocardiogram 04/22/10    EF 55-60%, no valve issues  . History of stress test 06/21/10    limited exam with some degree of breast attenuatin of the apex however a component of apical ischemia is not excluded, EF 62%; cardiac cath     Past Surgical History  Procedure Laterality Date  . Cervical fusion      with correction-june 2005  . Infusion pump implantation      baclofen infusion in lower abd  . Venous ablation  12/16/10    radiofreq ablation -Dr Toni [name] and [facility]  . Back surgery    . Child births      X45  . Cardiac catheterization  06/22/10    normal coronary arteries, PAF    Current Outpatient Prescriptions  Medication Sig Dispense Refill  . baclofen (LIORESAL) 20 MG tablet Take 20 mg by mouth 2 (two) times daily as needed (for pain). As needed      . bethanechol (URECHOLINE) 25 MG tablet Take 25 mg by mouth daily.      Marland Kitchen buPROPion (WELLBUTRIN XL) 300 MG 24 hr tablet Take 300 mg by mouth daily.      . calcium carbonate (OS-CAL) 600 MG TABS tablet Take 600 mg by mouth 2 (two) times daily with a meal.      . cephALEXin (KEFLEX) 250 MG capsule Take 250 mg by mouth at bedtime.      Marland Kitchen co-enzyme Q-10 30 MG capsule Take 30 mg by mouth daily.      . corticotropin (ACTHAR HP) 80 UNIT/ML injectable gel Inject 80 Units into the skin as directed. Uses 5 injections per month, usually first 5 days of month      . furosemide (LASIX) 40 MG  tablet Take 20 mg by mouth 2 (two) times daily.      Marland Kitchen gabapentin (NEURONTIN) 300 MG capsule Take 300 mg by mouth 3 (three) times  daily as needed. As needed      . lactulose (CHRONULAC) 10 GM/15ML solution Take 10 g by mouth 2 (two) times daily.      Marland Kitchen levothyroxine (SYNTHROID, LEVOTHROID) 75 MCG tablet Take 75 mcg by mouth daily.      . Multiple Vitamin (MULITIVITAMIN WITH MINERALS) TABS Take 1 tablet by mouth daily.      . Nebivolol HCl 20 MG TABS Take 20 mg by mouth daily.       . Omega-3 Fatty Acids (FISH OIL) 600 MG CAPS Take 1,200 mg by mouth daily.      Marland Kitchen omeprazole (PRILOSEC) 20 MG capsule Take 20 mg by mouth daily.       . ranitidine (ZANTAC) 150 MG tablet At bedtime      . simvastatin (ZOCOR) 20 MG tablet Take 20 mg by mouth at bedtime.       . Tamsulosin HCl (FLOMAX) 0.4 MG CAPS Take 0.4 mg by mouth daily.      . WARFARIN SODIUM PO Take 2.5-5 mg by mouth daily. Take 5mg  daily, except 2.5 on Mondays.      . Wheat Dextrin (BENEFIBER PO) Take 1 packet by mouth 2 (two) times daily.      Marland Kitchen ALPRAZolam (XANAX) 0.5 MG tablet Take 0.5 mg by mouth 2 (two) times daily as needed for anxiety.       . cetirizine (ZYRTEC) 10 MG tablet Take 10 mg by mouth daily.       No current facility-administered medications for this visit.    Allergies as of 06/11/2013 - Review Complete 06/11/2013  Allergen Reaction Noted  . Hydrocodone  11/26/2011  . Oxycodone  05/12/2013  . Chlorhexidine gluconate  11/26/2011  . Zoloft [sertraline] Hives, Swelling, and Rash 05/15/2013    Vitals: BP 121/77  Pulse 77  Wt 268 lb (121.564 kg) Last Weight:  Wt Readings from Last 1 Encounters:  06/11/13 268 lb (121.564 kg)   Last Height:   Ht Readings from Last 1 Encounters:  06/04/13 5\' 3"  (1.6 m)    Physical exam:  General: The patient is awake, alert and appears not in acute distress. The patient is well groomed. Head: Normocephalic, atraumatic. Neck is supple. Mallampati 4  neck  circumference:19. Cardiovascular:  Regular rate and rhythm today , without  murmurs or carotid bruit, and without distended neck veins. Respiratory: Lungs are clear to auscultation. Skin:  evidence of edema, not  Rash, Injection site reaction. Trunk: BMI is elevated .Neurologic exam :  The patient is awake and alert, oriented to place and time. Memory subjective described as intact. There is a normal attention span & concentration ability.  Speech without dysarthria, dysphonia , delayed wordfinding- aphasia.  Mood and affect are appropriate.  Cranial nerves:  Pupils are equal and briskly reactive to light. Extraocular movements in vertical and horizontal planes intact and without nystagmus. Visual fields by finger perimetry are intact.  Hearing to finger rub intact. Facial sensation intact to fine touch. Facial motor strength is symmetric and tongue and uvula move midline.  Motor exam: Patient reports that she is losing dexterity in her right hand feels clumsy and also numbish. She reports a gloved feeling the ,  She reports trouble with spitting up fine objects like lint or a pill. Decreased strength in all extremities.  Sensory: Fine touch, pinprick and vibration were restricted in the right, dominant hand , both feet,  Coordination: He reports changing in her handwriting and trouble holding a pencil in her  right him a dominant hand. Rapid alternating movements in the fingers/hands is slow. Gait and station:wheelchair .  Deep tendon reflexes: in the upper and lower extremities are attenuated, right babinski positive- up going.   The patient has noted poor body temperature control, at times uncomfortable .  Flushed without sweat,  No orthostatic problems, no HTN, no atrial fibrillation.   PLAN : continue ACTHAR 5 days a month,              Continue Baclofen PUMP.  She has unused Betaseron, that she can donate.  Dr ava send me her last blood tests, ED visit reviewed. Sept 25th 2014 after  Baclofen  Pump refill.

## 2013-06-11 NOTE — Patient Instructions (Signed)
Fall Prevention Falls are the leading cause of injuries, accidents, and accidental deaths in people over the age of 8. Falling is a real threat to your ability to live on your own. CAUSES   Poor eyesight or poor hearing can make you more likely to fall.   Illnesses and physical conditions can affect your strength and balance.   Poor lighting, throw rugs and pets in your home can make you more likely to trip or slip.   The side effects of some medicines can upset your balance and lead to falling. These include medicines for depression, sleep problems, high blood pressure, diabetes, and heart conditions.  PREVENTION  Be sure your home is as safe as possible. Here are some tips:  Wear shoes with non-skid soles (not house slippers).   Be sure your home and outside area are well lit.   Use night lights throughout your house, including hallways and stairways.   Remove clutter and clean up spills on floors and walkways.   Remove throw rugs or fasten them to the floor with carpet tape. Tack down carpet edges.   Do not place electrical cords across pathways.   Install grab bars in your bathtub, shower, and toilet area. Towel bars should not be used as a grab bar.   Install handrails on both sides of stairways.   Do not climb on stools or stepladders. Get someone else to help with jobs that require climbing.   Do not wax your floors at all, or use a non-skid wax.   Repair uneven or unsafe sidewalks, walkways or stairs.   Keep frequently used items within reach.   Be aware of pets so you do not trip.  Get regular check-ups from your doctor, and take good care of yourself:  Have your eyes checked every year for vision changes, cataracts, glaucoma, and other eye problems. Wear eyeglasses as directed.   Have your hearing checked every 2 years, or anytime you or others think that you cannot hear well. Use hearing aids as directed.   See your caregiver if you have foot pain or corns.  Sore feet can contribute to falls.   Let your caregiver know if a medicine is making you feel dizzy or making you lose your balance.   Use a cane, walker, or wheelchair as directed. Use walker or wheelchair brakes when getting in and out.   When you get up from bed, sit on the side of the bed for 1 to 2 minutes before you stand up. This will give your blood pressure time to adjust, and you will feel less dizzy.   If you need to go to the bathroom often, consider using a bedside commode.  Keep your body in good shape:  Get regular exercise, especially walking.   Do exercises to strengthen the muscles you use for walking and lifting.   Do not smoke.   Minimize use of alcohol.  SEEK IMMEDIATE MEDICAL CARE IF:   You feel dizzy, weak, or unsteady on your feet.   You feel confused.   You fall.  Document Released: 08/07/2005 Document Revised: 07/27/2011 Document Reviewed: 02/01/2007 [facility] Patient Information 2012 [place], [place].

## 2013-06-12 NOTE — Telephone Encounter (Signed)
Spoke with patient and informed of upcoming appt. Patient understood

## 2013-06-17 ENCOUNTER — Other Ambulatory Visit: Payer: Self-pay

## 2013-06-17 ENCOUNTER — Other Ambulatory Visit (INDEPENDENT_AMBULATORY_CARE_PROVIDER_SITE_OTHER): Payer: Self-pay

## 2013-06-17 DIAGNOSIS — G35 Multiple sclerosis: Secondary | ICD-10-CM | POA: Diagnosis not present

## 2013-06-17 DIAGNOSIS — Z0289 Encounter for other administrative examinations: Secondary | ICD-10-CM

## 2013-06-17 DIAGNOSIS — T148XXA Other injury of unspecified body region, initial encounter: Secondary | ICD-10-CM | POA: Diagnosis not present

## 2013-06-17 DIAGNOSIS — Z23 Encounter for immunization: Secondary | ICD-10-CM | POA: Diagnosis not present

## 2013-06-17 DIAGNOSIS — Z5181 Encounter for therapeutic drug level monitoring: Secondary | ICD-10-CM | POA: Diagnosis not present

## 2013-06-17 DIAGNOSIS — G4733 Obstructive sleep apnea (adult) (pediatric): Secondary | ICD-10-CM | POA: Diagnosis not present

## 2013-06-18 LAB — CBC WITH DIFFERENTIAL
Basophils Absolute: 0 10*3/uL (ref 0.0–0.2)
Lymphocytes Absolute: 2.2 10*3/uL (ref 0.7–3.1)
Lymphs: 37 %
MCHC: 34.1 g/dL (ref 31.5–35.7)
MCV: 89 fL (ref 79–97)
Monocytes: 10 %
Platelets: 199 10*3/uL (ref 150–379)
RBC: 4.94 x10E6/uL (ref 3.77–5.28)
RDW: 13.4 % (ref 12.3–15.4)
WBC: 6 10*3/uL (ref 3.4–10.8)

## 2013-06-18 LAB — COMPREHENSIVE METABOLIC PANEL
ALT: 24 IU/L (ref 0–32)
AST: 18 IU/L (ref 0–40)
Albumin: 3.9 g/dL (ref 3.6–4.8)
Alkaline Phosphatase: 60 IU/L (ref 39–117)
BUN/Creatinine Ratio: 18 (ref 11–26)
BUN: 13 mg/dL (ref 8–27)
CO2: 35 mmol/L — ABNORMAL HIGH (ref 18–29)
Chloride: 100 mmol/L (ref 96–108)
Creatinine, Ser: 0.74 mg/dL (ref 0.57–1.00)
GFR calc Af Amer: 98 mL/min/{1.73_m2} (ref >59)
Potassium: 4.5 mmol/L (ref 3.5–5.2)
Total Bilirubin: 0.3 mg/dL (ref 0.0–1.2)
Total Protein: 5.9 g/dL — ABNORMAL LOW (ref 6.0–8.5)

## 2013-06-20 ENCOUNTER — Telehealth: Payer: Self-pay | Admitting: Neurology

## 2013-06-20 ENCOUNTER — Other Ambulatory Visit: Payer: Self-pay

## 2013-06-20 ENCOUNTER — Ambulatory Visit (INDEPENDENT_AMBULATORY_CARE_PROVIDER_SITE_OTHER): Payer: Medicare Other | Admitting: Neurology

## 2013-06-20 ENCOUNTER — Encounter: Payer: Self-pay | Admitting: Neurology

## 2013-06-20 DIAGNOSIS — G35 Multiple sclerosis: Secondary | ICD-10-CM

## 2013-06-20 MED ORDER — ALPRAZOLAM 0.5 MG PO TABS: 0.5000 mg | ORAL_TABLET | Freq: Two times a day (BID) | ORAL | Status: DC | PRN

## 2013-06-20 NOTE — Telephone Encounter (Signed)
Patient called requesting a 90 day Rx for Alprazolam.  She would like it sent to the mail order pharmacy.  Dr Vickey Huger is out of the office, Forwarding request to Dr Terrace Arabia, [facility]

## 2013-06-20 NOTE — Telephone Encounter (Signed)
Rx faxed. Patient notified.

## 2013-06-20 NOTE — Progress Notes (Signed)
The patient comes in today for reprogramming of the baclofen pump. Please refer to this note.

## 2013-06-20 NOTE — Procedures (Signed)
Toni [name] is a 67 year old patient with a history of multiple sclerosis associated with a gait disorder and spasticity. Over the last 1 week, she has noted an increase in her spasticity level involving the arms and legs. The patient comes in for a reprogramming session for her baclofen pump.  The pump settings prior to reprogramming were set at a basal rate of 34.0 mcg per hour. From the hours of 1 AM to 5 AM, the patient receives a total of 124.9 mcg, at a rate of 25 mcg per hour.  The pump was interrogated, and programs for a basal rate of 36.0 mcg per hour. 15 hours at 1 AM and 5 AM, the diffusion rate remained at 25 mcg per hour, or a total of 124.9 mcg. The total daily dose is 809.1 mcg. This is increased from 770.9 mcg daily.  The reservoir volume is 26.1 cc, low reservoir alarm volume is set at 1.5 cc. Low reservoir alarm date is 08/19/2013. The patient has a revisit on August 15 2013.

## 2013-06-20 NOTE — Progress Notes (Signed)
Quick Note:  Shared normal blood test with patient, understood and wanted labs sent to Dr Felipa Eth, faxed results ______

## 2013-06-20 NOTE — Telephone Encounter (Signed)
I called patient. She has had a one-week period of increased spasms of the arms, abdomen, and legs. She has taken extra oral baclofen and diazepam with some benefit, but she is still having issues. There is no other evidence of medical problems such as a urinary tract infection. The patient will come in today for a baclofen pump readjustment.

## 2013-06-24 ENCOUNTER — Ambulatory Visit: Payer: Self-pay | Admitting: Neurology

## 2013-07-02 ENCOUNTER — Ambulatory Visit (INDEPENDENT_AMBULATORY_CARE_PROVIDER_SITE_OTHER): Payer: Medicare Other | Admitting: Pharmacist Clinician (PhC)/ Clinical Pharmacy Specialist

## 2013-07-02 ENCOUNTER — Encounter: Payer: Self-pay | Admitting: Pharmacist Clinician (PhC)/ Clinical Pharmacy Specialist

## 2013-07-02 VITALS — BP 120/74 | HR 68

## 2013-07-02 DIAGNOSIS — Z7901 Long term (current) use of anticoagulants: Secondary | ICD-10-CM

## 2013-07-02 DIAGNOSIS — I4891 Unspecified atrial fibrillation: Secondary | ICD-10-CM | POA: Diagnosis not present

## 2013-07-07 NOTE — Telephone Encounter (Signed)
Appt completed

## 2013-07-23 ENCOUNTER — Ambulatory Visit (INDEPENDENT_AMBULATORY_CARE_PROVIDER_SITE_OTHER): Payer: Medicare Other | Admitting: Pharmacist Clinician (PhC)/ Clinical Pharmacy Specialist

## 2013-07-23 VITALS — BP 108/66 | HR 72

## 2013-07-23 DIAGNOSIS — I4891 Unspecified atrial fibrillation: Secondary | ICD-10-CM

## 2013-07-23 DIAGNOSIS — Z7901 Long term (current) use of anticoagulants: Secondary | ICD-10-CM

## 2013-08-15 ENCOUNTER — Ambulatory Visit (INDEPENDENT_AMBULATORY_CARE_PROVIDER_SITE_OTHER): Payer: Medicare Other | Admitting: Neurology

## 2013-08-15 VITALS — BP 130/75 | HR 71

## 2013-08-15 DIAGNOSIS — G35 Multiple sclerosis: Secondary | ICD-10-CM

## 2013-08-15 DIAGNOSIS — M62838 Other muscle spasm: Secondary | ICD-10-CM | POA: Diagnosis not present

## 2013-08-15 NOTE — Progress Notes (Signed)
Baclofen pump refill was done today, please refer to procedure note.

## 2013-08-15 NOTE — Procedures (Signed)
     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 The patient comes in today for a baclofen pump refill. The patient indicates that she is having "good days and bad days" with her spasticity. Overall, there has been no real change in her level of functioning since last seen.  Baclofen pump refill note  The baclofen pump site was cleaned with Betadine solution. A 21-gauge needle was inserted into the pump port site. Approximately 5 cc of residual baclofen was removed. 40 cc of replacement baclofen was placed into the pump at 2000 mcg/cc concentration.  The pump was reprogrammed for the following settings: 809.1 mcg per day to daily dose. The basal rate is 36.0 mcg per hour from 5 AM until 1 AM. From 1 AM to 5 AM, the basal rate drops to 25 mcg per hour with a total dose over this period of 124.9 mcg being delivered. The rate was changed from the prior settings.  The alarm volume is set at 1.5 cc. The next alarm date is 11/18/2013.  The patient tolerated the procedure well. There were no complications of the above procedure.

## 2013-08-18 DIAGNOSIS — R300 Dysuria: Secondary | ICD-10-CM | POA: Diagnosis not present

## 2013-08-18 DIAGNOSIS — N390 Urinary tract infection, site not specified: Secondary | ICD-10-CM | POA: Diagnosis not present

## 2013-08-18 DIAGNOSIS — R39198 Other difficulties with micturition: Secondary | ICD-10-CM | POA: Diagnosis not present

## 2013-08-18 DIAGNOSIS — N312 Flaccid neuropathic bladder, not elsewhere classified: Secondary | ICD-10-CM | POA: Diagnosis not present

## 2013-08-20 ENCOUNTER — Ambulatory Visit (INDEPENDENT_AMBULATORY_CARE_PROVIDER_SITE_OTHER): Payer: Medicare Other | Admitting: Pharmacist Clinician (PhC)/ Clinical Pharmacy Specialist

## 2013-08-20 VITALS — BP 104/64 | HR 76

## 2013-08-20 DIAGNOSIS — I4891 Unspecified atrial fibrillation: Secondary | ICD-10-CM

## 2013-08-20 DIAGNOSIS — Z5181 Encounter for therapeutic drug level monitoring: Secondary | ICD-10-CM

## 2013-08-20 DIAGNOSIS — Z7901 Long term (current) use of anticoagulants: Secondary | ICD-10-CM | POA: Diagnosis not present

## 2013-08-26 ENCOUNTER — Telehealth: Payer: Self-pay | Admitting: Neurology

## 2013-08-26 DIAGNOSIS — E662 Morbid (severe) obesity with alveolar hypoventilation: Secondary | ICD-10-CM

## 2013-08-26 DIAGNOSIS — G4733 Obstructive sleep apnea (adult) (pediatric): Secondary | ICD-10-CM

## 2013-08-26 DIAGNOSIS — G35 Multiple sclerosis: Secondary | ICD-10-CM

## 2013-08-26 DIAGNOSIS — Z9989 Dependence on other enabling machines and devices: Secondary | ICD-10-CM

## 2013-08-26 NOTE — Telephone Encounter (Signed)
Having trouble with sleep apnea even with the machine. She is waking up many times throughout the night and she wants her to check her download to see whats going on.

## 2013-08-26 NOTE — Telephone Encounter (Signed)
Sending to sleep lab.

## 2013-08-26 NOTE — Telephone Encounter (Signed)
I called and spoke with the patient and she stated she's waking up every hour and half and she has called [employer] her DME company to send the download.  I informed the patient that I will speak with Jeneen Rinks Huey Romans) about getting the download before 2:00, so that Jeanene Erb may take a look and see what's going on and she'll [facility]) call her back.

## 2013-08-27 NOTE — Telephone Encounter (Signed)
Waking 1.5 hours after sleep onset, may wake up 2 more times in 1.5-2 hours.  Can sleep a long period of time, but generally having 2-3 awakenings    Has had issues with congestion, had to stop and use saline and start again and notices that since this has been happening she has far more issues with CPAP therapy.  We discussed using additional humidification, continuing with saline, using a decongestant if needed (short term), we also discussed the possible use of Fluticasone or similar prescription steroid nasal spray.

## 2013-08-27 NOTE — Telephone Encounter (Signed)
Continued from below:  Patient states she has used Fluticasone before but doesn't have an active prescription now, she wonders if Dr. Brett Fairy would prescribe this for her.  I will message Dr. Brett Fairy to see if she will approve.  Patient pharmacy is [facility] on [address].  I will contact Jeneen Rinks at [name] to ask about a download that contains the most recent 30 days as the one we reviewed covers 06/15/13 to 07/14/13.  Patient states she is acutely aware of more issues in the last 30 days.  I told her I will call her back to report once I get this information.  There were some reported issues with the modem.  We discussed how in theory, it is possible she could be awakening at REM onset given the cyclical nature of her awakenings.  She could enter REM and worsen so quickly that the machine doesn't have a chance to catch up and increase the air pressure fast enough before she awakens.  This could be helped by an increase in the low pressure so the machine doesn't have as far to go to get to therapeutic pressures at REM onset.  We will wait to request any pressure changes until we have 30 recent days.

## 2013-08-28 DIAGNOSIS — J984 Other disorders of lung: Secondary | ICD-10-CM | POA: Diagnosis not present

## 2013-08-28 DIAGNOSIS — J40 Bronchitis, not specified as acute or chronic: Secondary | ICD-10-CM | POA: Diagnosis not present

## 2013-08-28 DIAGNOSIS — J069 Acute upper respiratory infection, unspecified: Secondary | ICD-10-CM | POA: Diagnosis not present

## 2013-08-28 DIAGNOSIS — R509 Fever, unspecified: Secondary | ICD-10-CM | POA: Diagnosis not present

## 2013-08-29 ENCOUNTER — Telehealth: Payer: Self-pay | Admitting: Pharmacist Clinician (PhC)/ Clinical Pharmacy Specialist

## 2013-08-29 ENCOUNTER — Encounter: Payer: Self-pay | Admitting: Neurology

## 2013-08-29 NOTE — Telephone Encounter (Signed)
Have question about some medication .Marland Kitchen Please call

## 2013-08-29 NOTE — Telephone Encounter (Signed)
Pt was started on Levaquin today for pneumonia.  Advised to continue warfarin at current dose, scheduled appointment for Monday 2:10pm for INR check.

## 2013-09-01 ENCOUNTER — Ambulatory Visit (INDEPENDENT_AMBULATORY_CARE_PROVIDER_SITE_OTHER): Payer: Medicare Other | Admitting: Pharmacist Clinician (PhC)/ Clinical Pharmacy Specialist

## 2013-09-01 ENCOUNTER — Encounter: Payer: Self-pay | Admitting: Neurology

## 2013-09-01 VITALS — BP 124/70 | HR 68

## 2013-09-01 DIAGNOSIS — Z5181 Encounter for therapeutic drug level monitoring: Secondary | ICD-10-CM

## 2013-09-01 DIAGNOSIS — I4891 Unspecified atrial fibrillation: Secondary | ICD-10-CM | POA: Diagnosis not present

## 2013-09-01 DIAGNOSIS — Z7901 Long term (current) use of anticoagulants: Secondary | ICD-10-CM

## 2013-09-01 LAB — POCT INR: INR: 3.5

## 2013-09-01 NOTE — Telephone Encounter (Signed)
01 September 2013 . I reviewed a 7 day download on auto pap for Mrs Lodato ,set between 7 and 15 cm water, her  90% of pressure used  is  13.5 cm water . , The patient has good compliance  ( over 5 hours ) but a high residual AHI of 9 /hr. This may reflect in her clinical symptoms of the machine not working well . Very Tired and fatigued. Hypoventilation could be an issue. CO2 retention possible  I send a note to [employer] to review , increase the Autopap window to 8 cm water min and up to 16 cm [name]. The patient should bring her machine to the next [place]  visit for download and review.    She may need a BiPAP in the future.  Hypoventilation risk is high, and her download documented almost 1 central apnea for ever 2 obstructive apneas.  She may need an overnight pulse-oximetry parallel to the CPAP use . I have put an order in.    Sherre Wooton

## 2013-09-15 DIAGNOSIS — K5900 Constipation, unspecified: Secondary | ICD-10-CM | POA: Diagnosis not present

## 2013-09-15 DIAGNOSIS — K219 Gastro-esophageal reflux disease without esophagitis: Secondary | ICD-10-CM | POA: Diagnosis not present

## 2013-09-17 ENCOUNTER — Ambulatory Visit (INDEPENDENT_AMBULATORY_CARE_PROVIDER_SITE_OTHER): Payer: Medicare Other | Admitting: Pharmacist Clinician (PhC)/ Clinical Pharmacy Specialist

## 2013-09-17 ENCOUNTER — Other Ambulatory Visit: Payer: Self-pay | Admitting: *Deleted

## 2013-09-17 VITALS — BP 118/68 | HR 72

## 2013-09-17 DIAGNOSIS — I4891 Unspecified atrial fibrillation: Secondary | ICD-10-CM | POA: Diagnosis not present

## 2013-09-17 DIAGNOSIS — Z7901 Long term (current) use of anticoagulants: Secondary | ICD-10-CM | POA: Diagnosis not present

## 2013-09-17 DIAGNOSIS — G4733 Obstructive sleep apnea (adult) (pediatric): Secondary | ICD-10-CM

## 2013-09-17 LAB — POCT INR: INR: 2.3

## 2013-09-17 NOTE — Progress Notes (Signed)
Patient called after receiving her feedback from the most recent download.  We discussed how she is waking every 90 minutes every night.  She explains that she used to have settings that started at 10 cm H2O and she didn't use any ramp setting, she would just start out.  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  She would like to increase the starting pressure where she had it before (at 10 cm H2O) to see if she notices an improvement in the nighttime awakenings.  Dr. Brett Fairy has also ordered an overnight oximetry on CPAP to be performed and those orders have already gone out to [place].   I will forward these orders to [place] today.

## 2013-09-19 ENCOUNTER — Telehealth: Payer: Self-pay | Admitting: Pharmacist Clinician (PhC)/ Clinical Pharmacy Specialist

## 2013-09-19 NOTE — Telephone Encounter (Signed)
Pt on levaquin x 7 days - advised to cut warfarin to 2.5 mg on Saturday/Monday/Wednesday, continue 1 tablet other days, then resume 1 tablet daily after last dose of levaquin.  Pt voiced understanding.

## 2013-09-19 NOTE — Telephone Encounter (Signed)
Back on levosloxacin 500mg  one per day for 7 days . Should she change her Warfrain .Marland KitchenPlease calll

## 2013-10-06 DIAGNOSIS — R918 Other nonspecific abnormal finding of lung field: Secondary | ICD-10-CM | POA: Diagnosis not present

## 2013-10-08 ENCOUNTER — Other Ambulatory Visit: Payer: Self-pay | Admitting: Neurology

## 2013-10-13 DIAGNOSIS — G35 Multiple sclerosis: Secondary | ICD-10-CM | POA: Diagnosis not present

## 2013-10-13 DIAGNOSIS — G4733 Obstructive sleep apnea (adult) (pediatric): Secondary | ICD-10-CM | POA: Diagnosis not present

## 2013-10-13 DIAGNOSIS — K219 Gastro-esophageal reflux disease without esophagitis: Secondary | ICD-10-CM | POA: Diagnosis not present

## 2013-10-13 DIAGNOSIS — I10 Essential (primary) hypertension: Secondary | ICD-10-CM | POA: Diagnosis not present

## 2013-10-13 DIAGNOSIS — E039 Hypothyroidism, unspecified: Secondary | ICD-10-CM | POA: Diagnosis not present

## 2013-10-13 DIAGNOSIS — K5900 Constipation, unspecified: Secondary | ICD-10-CM | POA: Diagnosis not present

## 2013-10-13 DIAGNOSIS — J189 Pneumonia, unspecified organism: Secondary | ICD-10-CM | POA: Diagnosis not present

## 2013-10-13 DIAGNOSIS — Z0000 Encounter for general adult medical examination without abnormal findings: Secondary | ICD-10-CM | POA: Diagnosis not present

## 2013-10-13 DIAGNOSIS — IMO0002 Reserved for concepts with insufficient information to code with codable children: Secondary | ICD-10-CM | POA: Diagnosis not present

## 2013-10-13 DIAGNOSIS — R609 Edema, unspecified: Secondary | ICD-10-CM | POA: Diagnosis not present

## 2013-10-13 DIAGNOSIS — R7301 Impaired fasting glucose: Secondary | ICD-10-CM | POA: Diagnosis not present

## 2013-10-15 ENCOUNTER — Ambulatory Visit: Payer: Medicare Other | Admitting: Pharmacist Clinician (PhC)/ Clinical Pharmacy Specialist

## 2013-10-15 ENCOUNTER — Ambulatory Visit (INDEPENDENT_AMBULATORY_CARE_PROVIDER_SITE_OTHER): Payer: Medicare Other | Admitting: Nurse Practitioner

## 2013-10-15 ENCOUNTER — Ambulatory Visit (INDEPENDENT_AMBULATORY_CARE_PROVIDER_SITE_OTHER): Payer: Medicare Other | Admitting: Pharmacist Clinician (PhC)/ Clinical Pharmacy Specialist

## 2013-10-15 ENCOUNTER — Encounter: Payer: Self-pay | Admitting: Nurse Practitioner

## 2013-10-15 VITALS — BP 144/80 | HR 88

## 2013-10-15 VITALS — BP 150/80 | HR 67 | Ht 63.0 in | Wt 268.0 lb

## 2013-10-15 DIAGNOSIS — Z7901 Long term (current) use of anticoagulants: Secondary | ICD-10-CM | POA: Diagnosis not present

## 2013-10-15 DIAGNOSIS — Z9989 Dependence on other enabling machines and devices: Principal | ICD-10-CM

## 2013-10-15 DIAGNOSIS — E662 Morbid (severe) obesity with alveolar hypoventilation: Secondary | ICD-10-CM

## 2013-10-15 DIAGNOSIS — G8389 Other specified paralytic syndromes: Secondary | ICD-10-CM | POA: Diagnosis not present

## 2013-10-15 DIAGNOSIS — IMO0002 Reserved for concepts with insufficient information to code with codable children: Secondary | ICD-10-CM

## 2013-10-15 DIAGNOSIS — G4733 Obstructive sleep apnea (adult) (pediatric): Secondary | ICD-10-CM | POA: Diagnosis not present

## 2013-10-15 DIAGNOSIS — G35 Multiple sclerosis: Secondary | ICD-10-CM | POA: Diagnosis not present

## 2013-10-15 DIAGNOSIS — I4891 Unspecified atrial fibrillation: Secondary | ICD-10-CM | POA: Diagnosis not present

## 2013-10-15 LAB — POCT INR: INR: 3

## 2013-10-15 NOTE — Progress Notes (Signed)
PATIENT: Toni [name] DOB: March 24, 1946   REASON FOR VISIT: follow up HISTORY FROM: patient  HISTORY OF PRESENT ILLNESS: Toni [name] is a 68 y.o. female Is seen here as a referral/ revisit from Dr. Dagmar Hait for MS.  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 Rosiland Oz [phone]) with Rebif, later changed under Dr. Edwena Felty guidance to copaxone and later to Betaseron and than - finally- Tysabri .  Tysabri was discontinued 2010, since 2010 she has noticed a slow rather than stepwise decline but she seems to have had at least one MS relapse per anno, that she still differentiate from her otherwise progressive disease.  Due to treatments with steroids pulse steroids as well as oral taper, she has gained a lot of weight and this induced in addition sleep apnea. She also has a neurogenic bladder and had often troubled his UTIs in the past. She is treated for her sleep apnea the CPAP and he was is this for about 6 hours nightly but download dated 02/06/2013 shows an average daily usage of 7 hours and 40 minutes at a pressure of 12 cm water and an apnea index of 8.7 she also has a rather high air leak. I would like for the patient is a high residual AHI of 21.8 to use an hour to titrate her between 6 and 15 cm water. I would generate the order today. DME [employer].  MS related truncal and abdominal spasms, she has multiple brain lesions on her followup MRIs, but no spinal lesions. In 2009 she begun using a baclofen pump initially with wonderful results, but unfortunately adjustments and doors and pulse doses, has not been able reciprocated the same effect on her. Baclofen pump used to be followed by Dr. Gaynell Face and is now followed by Dr. [name].  A baclofen pump check and refill was performed on Sept 27 th 2014 with Dr. [name].  Toni [name] is concerned about  the recent prescription of Acthar, as she has experienced atrial fibrillation under Solu-Medrol pulse therapy in the past and has had high blood pressures as well as high blood sugars.  The idea of the precursor hormone being used was that this would be less likely to cause hypertensive crisis or hyperglycemia. A 15 vial medication package has been delivered to Toni [name]'s home but the company's nurse has not been able to come by.  Toni [name] also reports that the prescription brought for intramuscular injection and not subcutaneous as is normally used for ACTH.  I change her to subcutaneous pathway for ACTHAR. She tolerates this medication very well, but she is concerned and saddend by it's price. Its a gel medication and causes some bruises, but no serious defects.   UPDATE 10/15/13 (LL): Toni [name] comes in for routine follow up today.  She has no new issues, other than noticing a slight increase in her body temperature later in the day, which is still only 98.2, she wonders if this is an effect of Acthar.  She states her morning temp is usually around 96.2.  Review of Systems:  Out of a complete 14 system review, the patient complains of only the following symptoms, and all other reviewed systems are negative.  Fever, flushing, constipation, apnea, frequent waking, difficulty emptying bladder, back pain, walking difficulty, coordination problem, memory loss, numbness, weakness, No longer ambulatory .  ALLERGIES: Allergies  Allergen Reactions  . Hydrocodone     Hydrocodone causes vomiting  . Oxycodone     Oral oxycodone causes vomiting  . Chlorhexidine Gluconate     Sores at site  . Zoloft [Sertraline] Hives, Swelling and Rash    HOME MEDICATIONS: Outpatient Prescriptions Prior to Visit  Medication Sig Dispense Refill  . ALPRAZolam (XANAX) 0.5 MG tablet Take 1 tablet (0.5 mg total) by mouth 2 (two) times daily as needed for anxiety.  180 tablet  0  . baclofen (LIORESAL) 20 MG tablet Take  20 mg by mouth 2 (two) times daily as needed (for pain). As needed      . buPROPion (WELLBUTRIN XL) 300 MG 24 hr tablet Take 300 mg by mouth daily.      . calcium carbonate (OS-CAL) 600 MG TABS tablet Take 600 mg by mouth 2 (two) times daily with a meal.      . cephALEXin (KEFLEX) 250 MG capsule Take 250 mg by mouth at bedtime.      . cetirizine (ZYRTEC) 10 MG tablet Take 10 mg by mouth daily.      Marland Kitchen co-enzyme Q-10 30 MG capsule Take 30 mg by mouth daily.      . furosemide (LASIX) 40 MG tablet Take 20 mg by mouth 2 (two) times daily.      Marland Kitchen gabapentin (NEURONTIN) 300 MG capsule Take 300 mg by mouth 3 (three) times daily as needed. As needed      . lactulose (CHRONULAC) 10 GM/15ML solution Take 10 g by mouth 2 (two) times daily.      Marland Kitchen levothyroxine (SYNTHROID, LEVOTHROID) 75 MCG tablet Take 75 mcg by mouth daily.      . Multiple Vitamin (MULITIVITAMIN WITH MINERALS) TABS Take 1 tablet by mouth daily.      . Nebivolol HCl 20 MG TABS Take 20 mg by mouth daily.       . Omega-3 Fatty Acids (FISH OIL) 600 MG CAPS Take 1,200 mg by mouth daily.      Marland Kitchen omeprazole (PRILOSEC) 20 MG capsule Take 20 mg by mouth daily.       . ranitidine (ZANTAC) 150 MG tablet At bedtime      . simvastatin (ZOCOR) 20 MG tablet Take 20 mg by mouth at bedtime.       . Tamsulosin HCl (FLOMAX) 0.4 MG CAPS Take 0.4 mg by mouth daily.      . WARFARIN SODIUM PO Take 2.5-5 mg by mouth daily. Take 5mg  daily, except 2.5 on Mondays.      . Wheat Dextrin (BENEFIBER PO) Take 1 packet by mouth 2 (two) times daily.      Marland Kitchen HP ACTHAR 80 UNIT/ML injectable gel Inject subcutaneously 80  units (1 ml) daily. Change  needle please.  5 mL  3  . levofloxacin (LEVAQUIN) 750 MG tablet        No facility-administered medications prior to visit.   PHYSICAL EXAM  Filed Vitals:   10/15/13 1121  BP: 150/80  Pulse: 67  Height: 5\' 3"  (1.6 m)  Weight: 268 lb (121.564 kg)   Body mass index is 47.49 kg/(m^2).  General: The patient is awake, alert  and appears not in acute distress. The patient is well groomed.  Head: Normocephalic, atraumatic. Neck is supple. Mallampati 4 neck circumference:19.  Cardiovascular: Regular rate and rhythm today , without murmurs or carotid bruit, and without distended neck veins.  Respiratory: Lungs are clear to auscultation.  Skin: evidence of  edema, not Rash, Injection site reaction.  Trunk: BMI is elevated. Neurologic exam :  The patient is awake and alert, oriented to place and time. Memory subjective described as intact. There is a normal attention span & concentration ability.  Speech without dysarthria, dysphonia , delayed wordfinding- aphasia.  Mood and affect are appropriate.  Cranial nerves:  Pupils are equal and briskly reactive to light. Extraocular movements in vertical and horizontal planes intact and without nystagmus. Visual fields by finger perimetry are intact.  Hearing to finger rub intact. Facial sensation intact to fine touch. Facial motor strength is symmetric and tongue and uvula move midline.  Motor exam: Patient reports that she is losing dexterity in her right hand feels clumsy and also numbish. She reports a gloved feeling. She reports trouble with spitting up fine objects like lint or a pill. Decreased strength in all extremities.  Sensory: Fine touch, pinprick and vibration were restricted in the right, dominant hand, both feet Coordination: He reports changing in her handwriting and trouble holding a pencil in her right him a dominant hand. Rapid alternating movements in the fingers/hands is slow.  Gait and station wheelchair.  Deep tendon reflexes: in the upper and lower extremities are attenuated, right babinski positive- up going.   ASSESSMENT AND PLAN  68 y.o. year old female  has a past medical history of Multiple sclerosis; Hypertension; PAF (paroxysmal atrial fibrillation); Thyroid disease; HLD (hyperlipidemia); Shortness of breath; Pneumonia; Sleep apnea;  Hyperthyroidism; Edema; Depression; Anxiety; Headache(784.0); Chronic pain; echocardiogram (04/22/10); and History of stress test (06/21/10) here with for routine followup of Primary Progressive MS.  PLAN : Continue ACTHAR 5 days a month. Continue Baclofen PUMP.  Keep next follow up appointment with Dr. Brett Fairy, 4 months.  Meds ordered this encounter  Medications  . corticotropin (HP ACTHAR) 80 UNIT/ML injectable gel    Sig: Inject subcutaneously 80  units (1 ml) daily. Change  needle please. 5 days out of every month.   Toni Pali, MSN, NP-C 10/15/2013, 11:42 AM [facility] [address], [place], [place] [address] [phone]  Note: This document was prepared with digital dictation and possible smart phrase technology. Any transcriptional errors that result from this process are unintentional.

## 2013-10-15 NOTE — Patient Instructions (Signed)
Continue Acthar 5 days per month.  Continue Baclofen pump, refills with Dr. [name].  Next appointment with Dr. Brett Fairy, 4 months, call sooner if needed.  Call when ready to start Home PT.

## 2013-10-17 NOTE — Progress Notes (Signed)
I agree with the assessment and plan as directed by NP .The patient is known to me .   Sunday Klos, MD  

## 2013-10-23 ENCOUNTER — Other Ambulatory Visit: Payer: Self-pay

## 2013-10-23 DIAGNOSIS — Z1231 Encounter for screening mammogram for malignant neoplasm of breast: Secondary | ICD-10-CM

## 2013-10-24 ENCOUNTER — Other Ambulatory Visit: Payer: Self-pay | Admitting: Internal Medicine

## 2013-10-24 ENCOUNTER — Telehealth: Payer: Self-pay | Admitting: Neurology

## 2013-10-24 NOTE — Telephone Encounter (Signed)
WID- Dr. Edwena Felty patient is requesting an order for a wheelchair.  Please advise.

## 2013-10-24 NOTE — Telephone Encounter (Signed)
Pt called needs Dr. Brett Fairy to fax over a new order for her wheelchair, pt states that hers as expired. Please fax over to [facility] [place] or the [facility]  [facility] to [name].

## 2013-10-24 NOTE — Telephone Encounter (Signed)
Chart reviewed, long history of MS, in wheelchair, rx is ready, please fax.

## 2013-10-30 ENCOUNTER — Inpatient Hospital Stay (HOSPITAL_COMMUNITY): Payer: Medicare Other

## 2013-10-30 ENCOUNTER — Inpatient Hospital Stay (HOSPITAL_COMMUNITY)
Admission: EM | Admit: 2013-10-30 | Discharge: 2013-11-03 | DRG: 563 | Disposition: A | Discharge status: Skilled Nursing Facility | Payer: Medicare Other | Attending: Internal Medicine | Admitting: Internal Medicine

## 2013-10-30 ENCOUNTER — Emergency Department (HOSPITAL_COMMUNITY): Payer: Medicare Other

## 2013-10-30 ENCOUNTER — Encounter (HOSPITAL_COMMUNITY): Payer: Self-pay | Admitting: Internal Medicine

## 2013-10-30 DIAGNOSIS — N390 Urinary tract infection, site not specified: Secondary | ICD-10-CM | POA: Diagnosis present

## 2013-10-30 DIAGNOSIS — G35B0 Primary progressive multiple sclerosis, unspecified: Secondary | ICD-10-CM | POA: Diagnosis present

## 2013-10-30 DIAGNOSIS — G35 Multiple sclerosis: Secondary | ICD-10-CM | POA: Diagnosis not present

## 2013-10-30 DIAGNOSIS — I83893 Varicose veins of bilateral lower extremities with other complications: Secondary | ICD-10-CM

## 2013-10-30 DIAGNOSIS — G43909 Migraine, unspecified, not intractable, without status migrainosus: Secondary | ICD-10-CM | POA: Diagnosis present

## 2013-10-30 DIAGNOSIS — S82891A Other fracture of right lower leg, initial encounter for closed fracture: Secondary | ICD-10-CM | POA: Diagnosis present

## 2013-10-30 DIAGNOSIS — F3289 Other specified depressive episodes: Secondary | ICD-10-CM | POA: Diagnosis present

## 2013-10-30 DIAGNOSIS — M25476 Effusion, unspecified foot: Secondary | ICD-10-CM | POA: Diagnosis not present

## 2013-10-30 DIAGNOSIS — Z8249 Family history of ischemic heart disease and other diseases of the circulatory system: Secondary | ICD-10-CM | POA: Diagnosis not present

## 2013-10-30 DIAGNOSIS — S8290XD Unspecified fracture of unspecified lower leg, subsequent encounter for closed fracture with routine healing: Secondary | ICD-10-CM | POA: Diagnosis not present

## 2013-10-30 DIAGNOSIS — I4891 Unspecified atrial fibrillation: Secondary | ICD-10-CM

## 2013-10-30 DIAGNOSIS — F411 Generalized anxiety disorder: Secondary | ICD-10-CM | POA: Diagnosis present

## 2013-10-30 DIAGNOSIS — E785 Hyperlipidemia, unspecified: Secondary | ICD-10-CM | POA: Diagnosis present

## 2013-10-30 DIAGNOSIS — Z87891 Personal history of nicotine dependence: Secondary | ICD-10-CM | POA: Diagnosis not present

## 2013-10-30 DIAGNOSIS — E0590 Thyrotoxicosis, unspecified without thyrotoxic crisis or storm: Secondary | ICD-10-CM | POA: Diagnosis present

## 2013-10-30 DIAGNOSIS — I10 Essential (primary) hypertension: Secondary | ICD-10-CM | POA: Diagnosis present

## 2013-10-30 DIAGNOSIS — E079 Disorder of thyroid, unspecified: Secondary | ICD-10-CM

## 2013-10-30 DIAGNOSIS — M25579 Pain in unspecified ankle and joints of unspecified foot: Secondary | ICD-10-CM | POA: Diagnosis not present

## 2013-10-30 DIAGNOSIS — S82899A Other fracture of unspecified lower leg, initial encounter for closed fracture: Secondary | ICD-10-CM | POA: Diagnosis not present

## 2013-10-30 DIAGNOSIS — J690 Pneumonitis due to inhalation of food and vomit: Secondary | ICD-10-CM

## 2013-10-30 DIAGNOSIS — G4733 Obstructive sleep apnea (adult) (pediatric): Secondary | ICD-10-CM | POA: Diagnosis present

## 2013-10-30 DIAGNOSIS — S8253XA Displaced fracture of medial malleolus of unspecified tibia, initial encounter for closed fracture: Secondary | ICD-10-CM | POA: Diagnosis not present

## 2013-10-30 DIAGNOSIS — Z7901 Long term (current) use of anticoagulants: Secondary | ICD-10-CM | POA: Diagnosis not present

## 2013-10-30 DIAGNOSIS — M25473 Effusion, unspecified ankle: Secondary | ICD-10-CM | POA: Diagnosis not present

## 2013-10-30 DIAGNOSIS — R918 Other nonspecific abnormal finding of lung field: Secondary | ICD-10-CM | POA: Diagnosis not present

## 2013-10-30 DIAGNOSIS — F329 Major depressive disorder, single episode, unspecified: Secondary | ICD-10-CM | POA: Diagnosis present

## 2013-10-30 DIAGNOSIS — Z6841 Body Mass Index (BMI) 40.0 and over, adult: Secondary | ICD-10-CM

## 2013-10-30 DIAGNOSIS — Z9989 Dependence on other enabling machines and devices: Secondary | ICD-10-CM

## 2013-10-30 DIAGNOSIS — I480 Paroxysmal atrial fibrillation: Secondary | ICD-10-CM | POA: Diagnosis present

## 2013-10-30 DIAGNOSIS — Z5189 Encounter for other specified aftercare: Secondary | ICD-10-CM | POA: Diagnosis not present

## 2013-10-30 DIAGNOSIS — Z79899 Other long term (current) drug therapy: Secondary | ICD-10-CM | POA: Diagnosis not present

## 2013-10-30 DIAGNOSIS — D72829 Elevated white blood cell count, unspecified: Secondary | ICD-10-CM | POA: Diagnosis present

## 2013-10-30 DIAGNOSIS — S82843A Displaced bimalleolar fracture of unspecified lower leg, initial encounter for closed fracture: Secondary | ICD-10-CM | POA: Diagnosis not present

## 2013-10-30 DIAGNOSIS — W19XXXA Unspecified fall, initial encounter: Secondary | ICD-10-CM | POA: Diagnosis present

## 2013-10-30 DIAGNOSIS — M6281 Muscle weakness (generalized): Secondary | ICD-10-CM | POA: Diagnosis not present

## 2013-10-30 DIAGNOSIS — M62838 Other muscle spasm: Secondary | ICD-10-CM

## 2013-10-30 DIAGNOSIS — G4730 Sleep apnea, unspecified: Secondary | ICD-10-CM | POA: Diagnosis not present

## 2013-10-30 LAB — URINALYSIS, ROUTINE W REFLEX MICROSCOPIC
Bilirubin Urine: NEGATIVE
Glucose, UA: NEGATIVE mg/dL
Hgb urine dipstick: NEGATIVE
Ketones, ur: NEGATIVE mg/dL
Leukocytes, UA: NEGATIVE
Nitrite: NEGATIVE
Protein, ur: NEGATIVE mg/dL
Specific Gravity, Urine: 1.011 (ref 1.005–1.030)
Urobilinogen, UA: 0.2 mg/dL (ref 0.0–1.0)
pH: 7 (ref 5.0–8.0)

## 2013-10-30 LAB — I-STAT CHEM 8, ED
BUN: 18 mg/dL (ref 6–23)
CALCIUM ION: 1.1 mmol/L — AB (ref 1.13–1.30)
CHLORIDE: 96 meq/L (ref 96–112)
CREATININE: 0.9 mg/dL (ref 0.50–1.10)
GLUCOSE: 111 mg/dL — AB (ref 70–99)
HEMATOCRIT: 50 % — AB (ref 36.0–46.0)
Hemoglobin: 17 g/dL — ABNORMAL HIGH (ref 12.0–15.0)
Potassium: 3.6 mEq/L — ABNORMAL LOW (ref 3.7–5.3)
Sodium: 141 mEq/L (ref 137–147)
TCO2: 33 mmol/L (ref 0–100)

## 2013-10-30 LAB — CBC WITH DIFFERENTIAL/PLATELET
Basophils Absolute: 0 10*3/uL (ref 0.0–0.1)
Basophils Relative: 0 % (ref 0–1)
Eosinophils Absolute: 0.1 10*3/uL (ref 0.0–0.7)
Eosinophils Relative: 1 % (ref 0–5)
HCT: 47 % — ABNORMAL HIGH (ref 36.0–46.0)
Hemoglobin: 15.9 g/dL — ABNORMAL HIGH (ref 12.0–15.0)
Lymphocytes Relative: 15 % (ref 12–46)
Lymphs Abs: 2 10*3/uL (ref 0.7–4.0)
MCH: 30.8 pg (ref 26.0–34.0)
MCHC: 33.8 g/dL (ref 30.0–36.0)
MCV: 91.1 fL (ref 78.0–100.0)
Monocytes Absolute: 1.1 10*3/uL — ABNORMAL HIGH (ref 0.1–1.0)
Monocytes Relative: 8 % (ref 3–12)
Neutro Abs: 9.9 10*3/uL — ABNORMAL HIGH (ref 1.7–7.7)
Neutrophils Relative %: 76 % (ref 43–77)
Platelets: 199 10*3/uL (ref 150–400)
RBC: 5.16 MIL/uL — ABNORMAL HIGH (ref 3.87–5.11)
RDW: 12.3 % (ref 11.5–15.5)
WBC: 13.1 10*3/uL — ABNORMAL HIGH (ref 4.0–10.5)

## 2013-10-30 MED ORDER — ONDANSETRON 4 MG PO TBDP
4.0000 mg | ORAL_TABLET | Freq: Once | ORAL | Status: DC
  Filled 2013-10-30: qty 1

## 2013-10-30 MED ORDER — TRAMADOL HCL 50 MG PO TABS
50.0000 mg | ORAL_TABLET | Freq: Once | ORAL | Status: AC
Start: 2013-10-30 — End: 2013-10-30
  Administered 2013-10-30: 50 mg via ORAL
  Filled 2013-10-30: qty 1

## 2013-10-30 NOTE — ED Provider Notes (Addendum)
CSN: [id number]     Arrival date & time 10/30/13  1850 History   First MD Initiated Contact with Patient 10/30/13 1858     Chief Complaint  Patient presents with  . Ankle Pain     (Consider location/radiation/quality/duration/timing/severity/associated sxs/prior Treatment) Patient is a 68 y.o. female presenting with ankle pain. The history is provided by the patient.  Ankle Pain Location:  Ankle Time since incident:  1 hour Injury: yes   Mechanism of injury: fall   Mechanism of injury comment:  Pt has hx of MS and uses a walker and it hit a table causing the pt to loose her balance and she sat down in the floor but felt the right ankle pop Fall:    Fall occurred:  Walking   Impact surface:  Chief Strategy Officer of impact:  Buttocks   Entrapped after fall: no   Ankle location:  R ankle Pain details:    Quality:  Sharp and pressure   Radiates to:  Does not radiate   Severity:  Severe   Onset quality:  Sudden   Timing:  Constant   Progression:  Worsening Chronicity:  New Prior injury to area:  No Relieved by:  Rest and elevation Worsened by:  Bearing weight Associated symptoms: stiffness and swelling   Associated symptoms: no decreased ROM and no muscle weakness     Past Medical History  Diagnosis Date  . Multiple sclerosis     has had this may 1989-Dohmeir reg doc  . Hypertension   . PAF (paroxysmal atrial fibrillation)     on coumadin; documented on monitor 06/2010  . Thyroid disease   . HLD (hyperlipidemia)   . Shortness of breath   . Pneumonia   . Sleep apnea   . Hyperthyroidism   . Edema     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' ablation of R GSV 2012  . Depression   . Anxiety   . Headache(784.0)     MIGRAINE   . Chronic pain   . Hx of echocardiogram 04/22/10    EF 55-60%, no valve issues  . History of stress test 06/21/10    limited exam with some degree of breast attenuatin of the apex however a component of apical ischemia is not excluded,  EF 62%; cardiac cath    Past Surgical History  Procedure Laterality Date  . Cervical fusion      with correction-june 2005  . Infusion pump implantation      baclofen infusion in lower abd  . Venous ablation  12/16/10    radiofreq ablation -Dr Elisabeth Cara and [facility]  . Child births      X44  . Cardiac catheterization  06/22/10    normal coronary arteries, PAF   Family History  Problem Relation Age of Onset  . Coronary artery disease Father     at age 84  . Coronary artery disease Maternal Grandmother   . Depression Maternal Grandmother   . Cancer Paternal Grandmother   . Depression Mother    History  Substance Use Topics  . Smoking status: Former Smoker    Quit date: 08/21/1976  . Smokeless tobacco: Never Used     Comment: QUIT 1978  . Alcohol Use: Yes     Comment: SOCIALLY   OB History   Grav Para Term Preterm Abortions TAB SAB Ect Mult Living                 Review  of Systems  Musculoskeletal: Positive for stiffness.  All other systems reviewed and are negative.      Allergies  Hydrocodone; Oxycodone; Chlorhexidine gluconate; and Zoloft  Home Medications   Current Outpatient Rx  Name  Route  Sig  Dispense  Refill  . ALPRAZolam (XANAX) 0.5 MG tablet   Oral   Take 1 tablet (0.5 mg total) by mouth 2 (two) times daily as needed for anxiety.   180 tablet   0     Pharmacy Fax [phone]   . buPROPion (WELLBUTRIN XL) 300 MG 24 hr tablet   Oral   Take 300 mg by mouth daily.         . calcium carbonate (OS-CAL) 600 MG TABS tablet   Oral   Take 600 mg by mouth 2 (two) times daily with a meal.         . cephALEXin (KEFLEX) 250 MG capsule   Oral   Take 250 mg by mouth at bedtime.         . cetirizine (ZYRTEC) 10 MG tablet   Oral   Take 10 mg by mouth daily.         Marland Kitchen co-enzyme Q-10 30 MG capsule   Oral   Take 30 mg by mouth daily.         . corticotropin (HP ACTHAR) 80 UNIT/ML injectable gel      Inject subcutaneously 80  units (1 ml)  daily. Change  needle please. 5 days out of every month.         . furosemide (LASIX) 40 MG tablet   Oral   Take 20 mg by mouth 2 (two) times daily.         Marland Kitchen gabapentin (NEURONTIN) 300 MG capsule   Oral   Take 300 mg by mouth 3 (three) times daily as needed. Pain.         . lactulose (CHRONULAC) 10 GM/15ML solution   Oral   Take 10 g by mouth 2 (two) times daily.         Marland Kitchen levothyroxine (SYNTHROID, LEVOTHROID) 75 MCG tablet   Oral   Take 75 mcg by mouth daily.         . Misc. Devices MISC   Intrathecal   by Intrathecal route continuous. Baclofen pump. Rate of dispense unknown.         . Multiple Vitamin (MULITIVITAMIN WITH MINERALS) TABS   Oral   Take 1 tablet by mouth daily.         . Nebivolol HCl 20 MG TABS   Oral   Take 20 mg by mouth daily.          . Omega-3 Fatty Acids (FISH OIL) 600 MG CAPS   Oral   Take 1,200 mg by mouth daily.         Marland Kitchen omeprazole (PRILOSEC) 20 MG capsule   Oral   Take 20 mg by mouth daily.          . ranitidine (ZANTAC) 150 MG tablet   Oral   Take 150 mg by mouth at bedtime. At bedtime         . simvastatin (ZOCOR) 20 MG tablet   Oral   Take 20 mg by mouth at bedtime.          . Tamsulosin HCl (FLOMAX) 0.4 MG CAPS   Oral   Take 0.4 mg by mouth daily.         Marland Kitchen warfarin (COUMADIN) 5 MG tablet  Oral   Take 5 mg by mouth daily.         . Wheat Dextrin (BENEFIBER PO)   Oral   Take 1 packet by mouth 2 (two) times daily.          BP 142/83  Pulse 71  Temp(Src) 98.3 F (36.8 C) (Oral)  Resp 20  SpO2 100% Physical Exam  Nursing note and vitals reviewed. Constitutional: She is oriented to person, place, and time. She appears well-developed and well-nourished. No distress.  HENT:  Head: Normocephalic and atraumatic.  Eyes: EOM are normal. Pupils are equal, round, and reactive to light.  Cardiovascular: Normal rate.   Pulmonary/Chest: Effort normal.  Musculoskeletal:       Right hip: Normal.        Right knee: Normal.       Right ankle: She exhibits swelling and ecchymosis. She exhibits normal range of motion and normal pulse. Tenderness. Lateral malleolus tenderness found. No medial malleolus, no posterior TFL, no head of 5th metatarsal and no proximal fibula tenderness found.       Feet:  Neurological: She is alert and oriented to person, place, and time.  Skin: Skin is warm and dry.  Psychiatric: She has a normal mood and affect. Her behavior is normal.    ED Course  Procedures (including critical care time) Labs Review Labs Reviewed - No data to display Imaging Review Dg Ankle Complete Right  10/30/2013   CLINICAL DATA:  Fall.  Right ankle pain.  EXAM: RIGHT ANKLE - COMPLETE 3+ VIEW  COMPARISON:  None.  FINDINGS: There fractures of the medial malleolus and distal fibula. The median malleolar fracture is transverse across its base. The distal fibular fracture is oblique extending from the pressure a lateral mid-diaphysis to the medial metaphysis at the level of the ankle joint. There may be a posterior malleolar fracture along the posterior inferior corner of the distal tibia which is somewhat more equivocal.  Ankle mortise is normally space and aligned. There is minimal fracture displacement.  Bones are diffusely demineralized. There is calcaneal spurring and ossification along the distal Achilles tendon.  The soft tissues are diffusely edematous.  IMPRESSION: 1. Fracture of the base of the medial malleolus and oblique fracture across the distal fibula. There is a possible posterior malleolar fracture. No significant fracture displacement. Ankle joint is normally aligned.   Electronically Signed   By: Lajean Manes M.D.   On: 10/30/2013 19:35     EKG Interpretation None      MDM   Final diagnoses:  None   Pt with mechanical fall and felt a pop in the right ankle and now pain and swelling and unable to bear weight.  N/V intact and warm.  Significant ecchymosis and swelling  over the later and anterior ankle.  Normal exam of the knee and hip.  Plain films pending.  8:24 PM Evidence of ankle fx of the distal fibula and tibia.  Spoke with Dr. Marlou Sa who recommended early f/u next week and fracture boot and only minor weight bearing with pivoting if she has too.  Pt does not feel like she will be able to manage at home.  Will attempt to see if pt can pivot and provide her own care.  If not she may needs admission for pt/ot and rehab.  9:29 PM Pt's leg is unable to fit in a fracture boot and splint applied.  She was unable to transfer from the bed to the wheelchair here and  needs multiple nurses for help and do not feel she is safe at home.  Blanchie Dessert, MD 10/30/13 2025  Blanchie Dessert, MD 10/30/13 2130

## 2013-10-30 NOTE — H&P (Signed)
[facility] History and Physical  Toni [name] [id number] DOB: August 23, 1945 DOA: 10/30/2013  Referring physician: ER physician. PCP: [name], MD  Specialists: Dr. Beacher May. Neurologist.  Chief Complaint: Fall.  HPI: Toni [name] is a 68 y.o. female with history of multiple sclerosis who uses walker for ambulation had a fall today after getting tangled in the walker. Patient did not hit her head or lose consciousness. In the ER x-ray shows a right ankle fracture and on-call orthopedic surgeon Dr. Marlou Sa was consulted by ER physician and at this time Dr. Marlou Sa has requested nonweightbearing and followup as outpatient. Since patient has finding difficulty move around and has limited help at home patient has been admitted for further management. Patient denies any chest pain shortness of breath palpitations dizziness nausea vomiting abdominal pain diarrhea or any focal deficits. Patient has had been treated 2 weeks ago for pneumonia with Levaquin and patient is on chronic suppressive therapy for UTI. Patient's right lower extremity is in splint.  Review of Systems: As presented in the history of presenting illness, rest negative.  Past Medical History  Diagnosis Date  . Multiple sclerosis     has had this may 1989-D[facility]meir reg doc  . Hypertension   . PAF (paroxysmal atrial fibrillation)     on coumadin; documented on monitor 06/2010  . Thyroid disease   . HLD (hyperlipidemia)   . Shortness of breath   . Pneumonia   . Sleep apnea   . Hyperthyroidism   . Edema     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' ablation of R GSV 2012  . Depression   . Anxiety   . Headache(784.0)     MIGRAINE   . Chronic pain   . Hx of echocardiogram 04/22/10    EF 55-60%, no valve issues  . History of stress test 06/21/10    limited exam with some degree of breast attenuatin of the apex however a component of apical ischemia is not excluded, EF 62%; cardiac cath    Past  Surgical History  Procedure Laterality Date  . Cervical fusion      with correction-june 2005  . Infusion pump implantation      baclofen infusion in lower abd  . Venous ablation  12/16/10    radiofreq ablation -Dr Elisabeth Cara and [facility]  . Child births      X61  . Cardiac catheterization  06/22/10    normal coronary arteries, PAF   Social History:  reports that she quit smoking about 37 years ago. She has never used smokeless tobacco. She reports that she drinks alcohol. She reports that she does not use illicit drugs. Where does patient live home. Can patient participate in ADLs? Yes.  Allergies  Allergen Reactions  . Hydrocodone     Hydrocodone causes vomiting  . Oxycodone     Oral oxycodone causes vomiting  . Chlorhexidine Gluconate     Sores at site  . Zoloft [Sertraline] Hives, Swelling and Rash    Family History:  Family History  Problem Relation Age of Onset  . Coronary artery disease Father     at age 2  . Coronary artery disease Maternal Grandmother   . Depression Maternal Grandmother   . Cancer Paternal Grandmother   . Depression Mother       Prior to Admission medications   Medication Sig Start Date End Date Taking? Authorizing Provider  ALPRAZolam Duanne Moron) 0.5 MG tablet Take 1 tablet (0.5 mg total)  by mouth 2 (two) times daily as needed for anxiety. 06/20/13  Yes Marcial Pacas, MD  buPROPion (WELLBUTRIN XL) 300 MG 24 hr tablet Take 300 mg by mouth daily.   Yes Historical Provider, MD  calcium carbonate (OS-CAL) 600 MG TABS tablet Take 600 mg by mouth 2 (two) times daily with a meal.   Yes Historical Provider, MD  cephALEXin (KEFLEX) 250 MG capsule Take 250 mg by mouth at bedtime.   Yes Historical Provider, MD  cetirizine (ZYRTEC) 10 MG tablet Take 10 mg by mouth daily.   Yes Historical Provider, MD  co-enzyme Q-10 30 MG capsule Take 30 mg by mouth daily.   Yes Historical Provider, MD  corticotropin (HP ACTHAR) 80 UNIT/ML injectable gel Inject subcutaneously 80  units  (1 ml) daily. Change  needle please. 5 days out of every month. 10/08/13  Yes Carmen Dohmeier, MD  furosemide (LASIX) 40 MG tablet Take 20 mg by mouth 2 (two) times daily.   Yes Historical Provider, MD  gabapentin (NEURONTIN) 300 MG capsule Take 300 mg by mouth 3 (three) times daily as needed. Pain.   Yes Historical Provider, MD  lactulose (CHRONULAC) 10 GM/15ML solution Take 10 g by mouth 2 (two) times daily.   Yes Historical Provider, MD  levothyroxine (SYNTHROID, LEVOTHROID) 75 MCG tablet Take 75 mcg by mouth daily.   Yes Historical Provider, MD  Misc. Devices MISC by Intrathecal route continuous. Baclofen pump. Rate of dispense unknown.   Yes Historical Provider, MD  Multiple Vitamin (MULITIVITAMIN WITH MINERALS) TABS Take 1 tablet by mouth daily.   Yes Historical Provider, MD  Nebivolol HCl 20 MG TABS Take 20 mg by mouth daily.    Yes Historical Provider, MD  Omega-3 Fatty Acids (FISH OIL) 600 MG CAPS Take 1,200 mg by mouth daily.   Yes Historical Provider, MD  omeprazole (PRILOSEC) 20 MG capsule Take 20 mg by mouth daily.    Yes Historical Provider, MD  ranitidine (ZANTAC) 150 MG tablet Take 150 mg by mouth at bedtime. At bedtime 12/22/12  Yes Historical Provider, MD  simvastatin (ZOCOR) 20 MG tablet Take 20 mg by mouth at bedtime.    Yes Historical Provider, MD  Tamsulosin HCl (FLOMAX) 0.4 MG CAPS Take 0.4 mg by mouth daily.   Yes Historical Provider, MD  warfarin (COUMADIN) 5 MG tablet Take 5 mg by mouth daily.   Yes Historical Provider, MD  Wheat Dextrin (BENEFIBER PO) Take 1 packet by mouth 2 (two) times daily.   Yes Historical Provider, MD    Physical Exam: Filed Vitals:   10/30/13 1851  BP: 142/83  Pulse: 71  Temp: 98.3 F (36.8 C)  TempSrc: Oral  Resp: 20  SpO2: 100%     General:  Well-developed and nourished. Obese.  Eyes: Anicteric no pallor.  ENT: No discharge from ears eyes nose mouth.  Neck: No mass felt.  Cardiovascular: S1-S2 heard.  Respiratory: No rhonchi or  crepitations.  Abdomen: Soft nontender bowel sounds present. No guarding or rigidity.  Skin: No rash.  Musculoskeletal: No obvious edema at this time. Right lower extremity is splinted.  Psychiatric: Appears normal.  Neurologic: Alert awake oriented to time place and person. Moves all extremities.  Labs on Admission:  Basic Metabolic Panel:  Recent Labs Lab 10/30/13 2208  NA 141  K 3.6*  CL 96  GLUCOSE 111*  BUN 18  CREATININE 0.90   Liver Function Tests: No results found for this basename: AST, ALT, ALKPHOS, BILITOT, PROT, ALBUMIN,  in  the last 168 hours No results found for this basename: LIPASE, AMYLASE,  in the last 168 hours No results found for this basename: AMMONIA,  in the last 168 hours CBC:  Recent Labs Lab 10/30/13 2155 10/30/13 2208  WBC 13.1*  --   NEUTROABS 9.9*  --   HGB 15.9* 17.0*  HCT 47.0* 50.0*  MCV 91.1  --   PLT 199  --    Cardiac Enzymes: No results found for this basename: CKTOTAL, CKMB, CKMBINDEX, TROPONINI,  in the last 168 hours  BNP (last 3 results) No results found for this basename: PROBNP,  in the last 8760 hours CBG: No results found for this basename: GLUCAP,  in the last 168 hours  Radiological Exams on Admission: Dg Ankle Complete Right  10/30/2013   CLINICAL DATA:  Fall.  Right ankle pain.  EXAM: RIGHT ANKLE - COMPLETE 3+ VIEW  COMPARISON:  None.  FINDINGS: There fractures of the medial malleolus and distal fibula. The median malleolar fracture is transverse across its base. The distal fibular fracture is oblique extending from the pressure a lateral mid-diaphysis to the medial metaphysis at the level of the ankle joint. There may be a posterior malleolar fracture along the posterior inferior corner of the distal tibia which is somewhat more equivocal.  Ankle mortise is normally space and aligned. There is minimal fracture displacement.  Bones are diffusely demineralized. There is calcaneal spurring and ossification along the  distal Achilles tendon.  The soft tissues are diffusely edematous.  IMPRESSION: 1. Fracture of the base of the medial malleolus and oblique fracture across the distal fibula. There is a possible posterior malleolar fracture. No significant fracture displacement. Ankle joint is normally aligned.   Electronically Signed   By: Lajean Manes M.D.   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 Right ankle fracture status post mechanical fall - patient has been placed on splint. Continue with pain relief medications. Physical therapy consult for further recommendations. Please discuss with Dr. Ninfa Linden, patient's orthopedic surgeon known to patient in a.m. to schedule followup appointment and discuss further about the management in a.m. 2. Leukocytosis - probably reactionary. Patient was recently treated for pneumonia with Levaquin 2 weeks ago. Patient is also on chronic suppressive therapy for UTI on cephalexin. UA and chest x-ray are pending. Patient is presently afebrile. 3. Multiple sclerosis - no symptoms to suggest acute exacerbation. 4. Atrial fibrillation presently rate controlled - on Coumadin. Coumadin per pharmacy. 5. Hypertension - continue home medications. 6. Hyperlipidemia - continue statins. 7. OSA - CPAP per respiratory.  I have reviewed patient's old charts labs and x-ray done today.  Code Status: Full code.  Family Communication: Patient's husband.  Disposition Plan: Admit to inpatient.    [name] N. [facility] Pager [phone].  If 7PM-7AM, please contact night-coverage [email or web address] Password [facility] 10/30/2013, 11:32 PM

## 2013-10-30 NOTE — Progress Notes (Signed)
This is old, updating.  Pt has had pressure window changed recently to address the residual AHI however no new download has been provided.  I will contact [employer] and ask for a current download for Dr. Edwena Felty review.

## 2013-10-30 NOTE — ED Notes (Signed)
Per ems, pt is from home, hx of MS. Pt got her leg in an awkward position, felt like she was going to fall, so sat down. Felt a pop in right ankle when she stood up. Reports she normally has some edema, but noticeable difference in edema compared to left ankle. Pain 4/10. Unable to bear weight on right leg. Able to bear weight on left leg with assistance for short amount of time. bil radial pulse.

## 2013-10-30 NOTE — ED Notes (Signed)
Bed: WA02 Expected date:  Expected time:  Means of arrival:  Comments: ems 

## 2013-10-31 ENCOUNTER — Telehealth: Payer: Self-pay | Admitting: Neurology

## 2013-10-31 LAB — BASIC METABOLIC PANEL
BUN: 16 mg/dL (ref 6–23)
CHLORIDE: 97 meq/L (ref 96–112)
CO2: 28 mEq/L (ref 19–32)
CREATININE: 0.65 mg/dL (ref 0.50–1.10)
Calcium: 9.1 mg/dL (ref 8.4–10.5)
GFR calc non Af Amer: >90 mL/min (ref >90)
GLUCOSE: 110 mg/dL — AB (ref 70–99)
POTASSIUM: 4 meq/L (ref 3.7–5.3)
Sodium: 139 mEq/L (ref 137–147)

## 2013-10-31 LAB — CBC
HEMATOCRIT: 43.4 % (ref 36.0–46.0)
Hemoglobin: 14.9 g/dL (ref 12.0–15.0)
MCH: 30.6 pg (ref 26.0–34.0)
MCHC: 34.3 g/dL (ref 30.0–36.0)
MCV: 89.1 fL (ref 78.0–100.0)
Platelets: 185 10*3/uL (ref 150–400)
RBC: 4.87 MIL/uL (ref 3.87–5.11)
RDW: 12.3 % (ref 11.5–15.5)
WBC: 13.8 10*3/uL — ABNORMAL HIGH (ref 4.0–10.5)

## 2013-10-31 LAB — PROTIME-INR
INR: 2.29 — ABNORMAL HIGH (ref 0.00–1.49)
Prothrombin Time: 24.5 seconds — ABNORMAL HIGH (ref 11.6–15.2)

## 2013-10-31 MED ORDER — GABAPENTIN 300 MG PO CAPS
300.0000 mg | ORAL_CAPSULE | Freq: Three times a day (TID) | ORAL | Status: DC | PRN
  Administered 2013-10-31 – 2013-11-02 (×3): 300 mg via ORAL
  Filled 2013-10-31 (×3): qty 1

## 2013-10-31 MED ORDER — FUROSEMIDE 20 MG PO TABS: 20.0000 mg | ORAL_TABLET | Freq: Two times a day (BID) | ORAL | Status: DC

## 2013-10-31 MED ORDER — MORPHINE SULFATE 2 MG/ML IJ SOLN: 1.0000 mg | INTRAMUSCULAR | Status: DC | PRN

## 2013-10-31 MED ORDER — FAMOTIDINE 20 MG PO TABS
20.0000 mg | ORAL_TABLET | Freq: Two times a day (BID) | ORAL | Status: DC
  Administered 2013-10-31 – 2013-11-03 (×7): 20 mg via ORAL
  Filled 2013-10-31 (×9): qty 1

## 2013-10-31 MED ORDER — WARFARIN - PHARMACIST DOSING INPATIENT: Freq: Every day | Status: DC

## 2013-10-31 MED ORDER — ONDANSETRON HCL 4 MG PO TABS: 4.0000 mg | ORAL_TABLET | Freq: Four times a day (QID) | ORAL | Status: DC | PRN

## 2013-10-31 MED ORDER — TAMSULOSIN HCL 0.4 MG PO CAPS
0.4000 mg | ORAL_CAPSULE | Freq: Every day | ORAL | Status: DC
  Administered 2013-10-31 – 2013-11-02 (×3): 0.4 mg via ORAL
  Filled 2013-10-31 (×4): qty 1

## 2013-10-31 MED ORDER — WARFARIN SODIUM 5 MG PO TABS
5.0000 mg | ORAL_TABLET | Freq: Once | ORAL | Status: AC
Start: 2013-10-31 — End: 2013-10-31
  Administered 2013-10-31: 5 mg via ORAL
  Filled 2013-10-31: qty 1

## 2013-10-31 MED ORDER — WARFARIN SODIUM 5 MG PO TABS
5.0000 mg | ORAL_TABLET | Freq: Once | ORAL | Status: AC
  Administered 2013-10-31: 5 mg via ORAL
  Filled 2013-10-31: qty 1

## 2013-10-31 MED ORDER — PANTOPRAZOLE SODIUM 40 MG PO TBEC
40.0000 mg | DELAYED_RELEASE_TABLET | Freq: Every day | ORAL | Status: DC
  Administered 2013-10-31 – 2013-11-03 (×4): 40 mg via ORAL
  Filled 2013-10-31 (×4): qty 1

## 2013-10-31 MED ORDER — SODIUM CHLORIDE 0.9 % IJ SOLN
3.0000 mL | Freq: Two times a day (BID) | INTRAMUSCULAR | Status: DC
  Administered 2013-10-31 – 2013-11-02 (×6): 3 mL via INTRAVENOUS

## 2013-10-31 MED ORDER — TRAMADOL HCL 50 MG PO TABS
50.0000 mg | ORAL_TABLET | Freq: Four times a day (QID) | ORAL | Status: DC | PRN
  Administered 2013-10-31 (×3): 50 mg via ORAL
  Administered 2013-11-01: 100 mg via ORAL
  Administered 2013-11-01: 50 mg via ORAL
  Filled 2013-10-31 (×3): qty 2
  Filled 2013-10-31: qty 1
  Filled 2013-10-31: qty 2

## 2013-10-31 MED ORDER — FUROSEMIDE 40 MG PO TABS
40.0000 mg | ORAL_TABLET | Freq: Two times a day (BID) | ORAL | Status: DC
  Administered 2013-10-31: 40 mg via ORAL
  Administered 2013-10-31: 20 mg via ORAL
  Administered 2013-11-01 – 2013-11-02 (×3): 40 mg via ORAL
  Filled 2013-10-31 (×6): qty 1

## 2013-10-31 MED ORDER — ACETAMINOPHEN 650 MG RE SUPP: 650.0000 mg | Freq: Four times a day (QID) | RECTAL | Status: DC | PRN

## 2013-10-31 MED ORDER — OMEGA-3-ACID ETHYL ESTERS 1 G PO CAPS
1.0000 g | ORAL_CAPSULE | Freq: Every day | ORAL | Status: DC
  Administered 2013-10-31 – 2013-11-03 (×4): 1 g via ORAL
  Filled 2013-10-31 (×4): qty 1

## 2013-10-31 MED ORDER — LEVOTHYROXINE SODIUM 75 MCG PO TABS
75.0000 ug | ORAL_TABLET | Freq: Every day | ORAL | Status: DC
  Administered 2013-10-31 – 2013-11-03 (×4): 75 ug via ORAL
  Filled 2013-10-31 (×6): qty 1

## 2013-10-31 MED ORDER — ACETAMINOPHEN 325 MG PO TABS
650.0000 mg | ORAL_TABLET | Freq: Four times a day (QID) | ORAL | Status: DC | PRN
  Administered 2013-11-02 – 2013-11-03 (×3): 650 mg via ORAL
  Filled 2013-10-31 (×3): qty 2

## 2013-10-31 MED ORDER — NEBIVOLOL HCL 10 MG PO TABS
20.0000 mg | ORAL_TABLET | Freq: Every day | ORAL | Status: DC
  Administered 2013-10-31 – 2013-11-03 (×4): 20 mg via ORAL
  Filled 2013-10-31 (×4): qty 2

## 2013-10-31 MED ORDER — DOCUSATE SODIUM 100 MG PO CAPS
100.0000 mg | ORAL_CAPSULE | Freq: Two times a day (BID) | ORAL | Status: DC
  Administered 2013-10-31 – 2013-11-03 (×7): 100 mg via ORAL

## 2013-10-31 MED ORDER — SIMVASTATIN 20 MG PO TABS
20.0000 mg | ORAL_TABLET | Freq: Every day | ORAL | Status: DC
  Administered 2013-10-31 – 2013-11-02 (×4): 20 mg via ORAL
  Filled 2013-10-31 (×5): qty 1

## 2013-10-31 MED ORDER — ADULT MULTIVITAMIN W/MINERALS CH
1.0000 | ORAL_TABLET | Freq: Every day | ORAL | Status: DC
  Administered 2013-10-31 – 2013-11-03 (×4): 1 via ORAL
  Filled 2013-10-31 (×4): qty 1

## 2013-10-31 MED ORDER — ONDANSETRON HCL 4 MG/2ML IJ SOLN: 4.0000 mg | Freq: Four times a day (QID) | INTRAMUSCULAR | Status: DC | PRN

## 2013-10-31 MED ORDER — FISH OIL 600 MG PO CAPS: 1200.0000 mg | ORAL_CAPSULE | Freq: Every day | ORAL | Status: DC

## 2013-10-31 MED ORDER — LORATADINE 10 MG PO TABS
10.0000 mg | ORAL_TABLET | Freq: Every day | ORAL | Status: DC
  Administered 2013-10-31 – 2013-11-03 (×4): 10 mg via ORAL
  Filled 2013-10-31 (×4): qty 1

## 2013-10-31 MED ORDER — BACLOFEN 20 MG PO TABS
20.0000 mg | ORAL_TABLET | Freq: Three times a day (TID) | ORAL | Status: DC | PRN
  Administered 2013-10-31 – 2013-11-02 (×3): 20 mg via ORAL
  Filled 2013-10-31 (×3): qty 1

## 2013-10-31 MED ORDER — FUROSEMIDE 20 MG PO TABS
20.0000 mg | ORAL_TABLET | Freq: Two times a day (BID) | ORAL | Status: DC
  Administered 2013-10-31: 20 mg via ORAL
  Filled 2013-10-31 (×3): qty 1

## 2013-10-31 MED ORDER — LACTULOSE 10 GM/15ML PO SOLN
10.0000 g | Freq: Two times a day (BID) | ORAL | Status: DC
  Administered 2013-10-31 – 2013-11-03 (×8): 10 g via ORAL
  Filled 2013-10-31 (×10): qty 15

## 2013-10-31 MED ORDER — ALPRAZOLAM 0.5 MG PO TABS
0.5000 mg | ORAL_TABLET | Freq: Two times a day (BID) | ORAL | Status: DC | PRN
  Administered 2013-10-31 – 2013-11-03 (×4): 0.5 mg via ORAL
  Filled 2013-10-31 (×4): qty 1

## 2013-10-31 MED ORDER — CEPHALEXIN 250 MG PO CAPS
250.0000 mg | ORAL_CAPSULE | Freq: Every day | ORAL | Status: DC
  Administered 2013-10-31 – 2013-11-02 (×4): 250 mg via ORAL
  Filled 2013-10-31 (×5): qty 1

## 2013-10-31 MED ORDER — CALCIUM CARBONATE 1250 (500 CA) MG PO TABS
1250.0000 mg | ORAL_TABLET | Freq: Two times a day (BID) | ORAL | Status: DC
  Administered 2013-10-31 – 2013-11-03 (×7): 1250 mg via ORAL
  Filled 2013-10-31 (×9): qty 1

## 2013-10-31 MED ORDER — BUPROPION HCL ER (XL) 300 MG PO TB24
300.0000 mg | ORAL_TABLET | Freq: Every day | ORAL | Status: DC
  Administered 2013-10-31 – 2013-11-03 (×4): 300 mg via ORAL
  Filled 2013-10-31 (×4): qty 1

## 2013-10-31 NOTE — Telephone Encounter (Signed)
Pt called from the hospital, states she broke her ankle and will be at [facility] for a while then heading to rehab after release. Pt wanted Dr. Brett Fairy and Dr. [name] to both know this. Pt would also like for someone to call her concerning her Baclofen pump whether Dr. [name] can come to the hospital and fill it for her.

## 2013-10-31 NOTE — Progress Notes (Addendum)
Called to patient's bedside regarding cpap.  Cpap currently set on 14cm h2o per pt request, however pt stated the pressure feels too much.  Pt stated her home machine auto adjusts from 10-16cm h2o, but never reaches past 13.7cm h2o per report.  Pt requested cpap settings changed to similar mode, with maximum at 14cm h2o.  Settings changed to autotitrate mode 10cm-14cm h2o per pt request.  Pt appears to be tolerating well at this time.  Pt was encouraged to call again should she need further assistance.

## 2013-10-31 NOTE — Discharge Instructions (Signed)
Keep your right ankle splint clean and dry. Non-weight bearing on your right ankle. Elevation as needed for swelling.

## 2013-10-31 NOTE — Evaluation (Signed)
Physical Therapy Evaluation Patient Details Name: Toni [name] MRN: [id number] DOB: Feb 10, 1946 Today's Date: 10/31/2013 Time: 5784-6962 PT Time Calculation (min): 19 min  PT Assessment / Plan / Recommendation History of Present Illness  Pt has H/O MS. Fell at home.Sustained R medial malleolous and fibula. Non operative. In a splint.  Clinical Impression  Pt able to stand and pivot with 2 persons and RW.    PT Assessment  Patient needs continued PT services    Follow Up Recommendations  SNF    Does the patient have the potential to tolerate intense rehabilitation      Barriers to Discharge Decreased caregiver support      Equipment Recommendations  None recommended by PT    Recommendations for Other Services     Frequency Min 3X/week    Precautions / Restrictions Precautions Precautions: Fall Precaution Comments: h/o MS Restrictions Weight Bearing Restrictions: Yes RLE Weight Bearing: Non weight bearing   Pertinent Vitals/Pain R ankle and back. RN notified.      Mobility  Bed Mobility Overal bed mobility: Needs Assistance;+2 for physical assistance;+ 2 for safety/equipment Bed Mobility: Supine to Sit Supine to sit: +2 for safety/equipment;Mod assist;HOB elevated General bed mobility comments: support R leg  Transfers Overall transfer level: Needs assistance Equipment used: Rolling walker (2 wheeled) Transfers: Sit to/from [facility] Sit to Stand: +2 physical assistance;+2 safety/equipment;Max assist;From elevated surface Stand pivot transfers: +2 safety/equipment;+2 physical assistance;Max assist General transfer comment: bed raised  to facilitate standing, pt unable to hop/step but able to scoot on L fot to turn to get to recliner.,  Ambulation/Gait Ambulation/Gait assistance: +2 physical assistance;+2 safety/equipment    Exercises     PT Diagnosis: Difficulty walking;Acute pain  PT Problem List: Decreased strength;Decreased activity  tolerance;Decreased mobility;Decreased knowledge of precautions;Decreased safety awareness;Obesity;Decreased knowledge of use of DME;Decreased coordination;Pain PT Treatment Interventions: DME instruction;Functional mobility training;Therapeutic activities;Therapeutic exercise;Patient/family education     PT Goals(Current goals can be found in the care plan section) Acute Rehab PT Goals Patient Stated Goal: I want to get up if I can PT Goal Formulation: With patient Time For Goal Achievement: 11/14/13 Potential to Achieve Goals: Good  Visit Information  Last PT Received On: 10/31/13 Assistance Needed: +2 History of Present Illness: Pt has H/O MS. Fell at home.Sustained R medial malleolous and fibula. Non operative. In a splint.       Prior [place] expects to be discharged to:: Skilled nursing facility Living Arrangements: Spouse/significant other Prior Function Level of Independence: Independent with assistive device(s) Communication Communication: No difficulties    Cognition  Cognition Arousal/Alertness: Awake/alert Behavior During Therapy: WFL for tasks assessed/performed Overall Cognitive Status: Within Functional Limits for tasks assessed    Extremity/Trunk Assessment Upper Extremity Assessment Upper Extremity Assessment: Overall WFL for tasks assessed Lower Extremity Assessment Lower Extremity Assessment: RLE deficits/detail RLE Deficits / Details: able to raise Leg from Bed. Cervical / Trunk Assessment Cervical / Trunk Assessment: Normal   Balance    End of Session PT - End of Session Equipment Utilized During Treatment: Gait belt Activity Tolerance: Patient limited by fatigue Patient left: in chair;with call bell/phone within reach;with family/visitor present Nurse Communication: Mobility status;Weight bearing status  GP     Kasey, Hansell 10/31/2013, 1:36 PM  Tresa Endo PT [phone]

## 2013-10-31 NOTE — Progress Notes (Signed)
[facility] for Warfarin Indication: atrial fibrillation  Allergies  Allergen Reactions  . Hydrocodone     Hydrocodone causes vomiting  . Oxycodone     Oral oxycodone causes vomiting  . Chlorhexidine Gluconate     Sores at site  . Zoloft [Sertraline] Hives, Swelling and Rash    Patient Measurements: Height: 5\' 3"  (160 cm) Weight: 270 lb (122.471 kg) IBW/kg (Calculated) : 52.4   Vital Signs: Temp: 98.6 F (37 C) (03/13 0906) Temp src: Oral (03/13 0906) BP: 138/72 mmHg (03/13 0906) Pulse Rate: 86 (03/13 0906)  Labs:  Recent Labs  10/30/13 2155 10/30/13 2208 10/31/13 0055  HGB 15.9* 17.0* 14.9  HCT 47.0* 50.0* 43.4  PLT 199  --  185  LABPROT  --   --  24.5*  INR  --   --  2.29*  CREATININE  --  0.90 0.65    Estimated Creatinine Clearance: 87.8 ml/min (by C-G formula based on Cr of 0.65).   Medical History: Past Medical History  Diagnosis Date  . Multiple sclerosis     has had this may 1989-[facility] reg doc  . Hypertension   . PAF (paroxysmal atrial fibrillation)     on coumadin; documented on monitor 06/2010  . Thyroid disease   . HLD (hyperlipidemia)   . Shortness of breath   . Pneumonia   . Sleep apnea   . Hyperthyroidism   . Edema     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' ablation of R GSV 2012  . Depression   . Anxiety   . Headache(784.0)     MIGRAINE   . Chronic pain   . Hx of echocardiogram 04/22/10    EF 55-60%, no valve issues  . History of stress test 06/21/10    limited exam with some degree of breast attenuatin of the apex however a component of apical ischemia is not excluded, EF 62%; cardiac cath     Medications:  Scheduled:  . buPROPion  300 mg Oral Daily  . calcium carbonate  1,250 mg Oral BID WC  . cephALEXin  250 mg Oral QHS  . docusate sodium  100 mg Oral BID  . famotidine  20 mg Oral BID  . furosemide  40 mg Oral BID  . lactulose  10 g Oral BID  . levothyroxine  75 mcg  Oral Daily  . loratadine  10 mg Oral Daily  . multivitamin with minerals  1 tablet Oral Daily  . nebivolol  20 mg Oral Daily  . omega-3 acid ethyl esters  1 g Oral Daily  . pantoprazole  40 mg Oral Daily  . simvastatin  20 mg Oral QHS  . sodium chloride  3 mL Intravenous Q12H  . tamsulosin  0.4 mg Oral Daily  . Warfarin - Pharmacist Dosing Inpatient   Does not apply q1800   Infusions:    Assessment: 68 yo F with hx of multiple sclerosis, on chronic warfarin for A-fib, admitted with R ankle fracture after a fall. Orders were received to continue warfarin with pharmacy dosing assistance while inpatient.  Patient confirms PTA warfarin dosage reported as 5 mg daily and says her INR is usually stable on that dosage.  INR therapeutic (2.29) on admission.  3/13: INR therapeutic (2.29 drawn 3/13 just after midnight) Inpatient warfarin doses administered: 3/13 0200:  5 mg   Goal of Therapy:  INR 2-3    Plan:   Warfarin 5 mg x1  tonight at 1800  Daily PT/INR while inpatient  Clayburn Pert, PharmD, BCPS Pager: [phone] 10/31/2013  11:00 AM

## 2013-10-31 NOTE — Progress Notes (Signed)
Patient ID: Toni [name], female   DOB: 27-Sep-1945, 68 y.o.   MRN: [id number] i did come by and see Ms. Olejniczak.  She has a right ankle fracture and has a well-fitting, well-padded splint.  This fracture may eventually need surgery in the coming weeks, but due to extensive soft-tissue swelling, this will need to wait.  She does have multiple medical problems and is on coumadin.  For now, I recommend non-weight bearing on her right ankle and agree with ST-SNF placement for rehab.  She will need follow-up with me in 7-10 days.

## 2013-10-31 NOTE — Progress Notes (Signed)
Clinical Social Work Department CLINICAL SOCIAL WORK PLACEMENT NOTE 10/31/2013  Patient:  Toni [name], Toni [name]  Account Number:  [id number] Admit date:  10/30/2013  Clinical Social Worker:  Werner Lean, LCSW  Date/time:  10/31/2013 01:56 PM  Clinical Social Work is seeking post-discharge placement for this patient at the following level of care:   SKILLED NURSING   (*CSW will update this form in Epic as items are completed)   10/31/2013  Patient/family provided with [facility] Department of Clinical Social Work's list of facilities offering this level of care within the geographic area requested by the patient (or if unable, by the patient's family).  10/31/2013  Patient/family informed of their freedom to choose among providers that offer the needed level of care, that participate in Medicare, Medicaid or managed care program needed by the patient, have an available bed and are willing to accept the patient.    Patient/family informed of [facility]' ownership interest in [facility], as well as of the fact that they are under no obligation to receive care at this facility.  PASARR submitted to EDS on 10/31/2013 PASARR number received from EDS on 10/31/2013  FL2 transmitted to all facilities in geographic area requested by pt/family on  10/31/2013 FL2 transmitted to all facilities within larger geographic area on 10/31/2013  Patient informed that his/her managed care company has contracts with or will negotiate with  certain facilities, including the following:     Patient/family informed of bed offers received:  10/31/2013 Patient chooses bed at  Physician recommends and patient chooses bed at    Patient to be transferred to  on   Patient to be transferred to facility by   The following physician request were entered in Epic:   Additional Comments:   Werner Lean LCSW [phone]

## 2013-10-31 NOTE — Telephone Encounter (Signed)
I called patient. The patient is in the hospital after she broke her ankle. The patient will be going to an extended care facility for rehabilitation. The patient will need to let the rehabilitation facility no she has an appointment on 11/17/2013 for her baclofen pump refill. They will arrange for transportation to our office.

## 2013-10-31 NOTE — Progress Notes (Signed)
Physical Therapy Treatment Patient Details Name: Toni [name] MRN: [id number] DOB: 1946/03/05 Today's Date: 10/31/2013 Time: 1696-7893 PT Time Calculation (min): 10 min  PT Assessment / Plan / Recommendation  History of Present Illness Pt has H/O MS. Fell at home.Sustained R medial malleolous and fibula. Non operative. In a splint.   PT Comments   Pt pivoted back to bed with 2 persons,tends to not push down into RW. Plans SNF,  Follow Up Recommendations  SNF     Does the patient have the potential to tolerate intense rehabilitation     Barriers to Discharge Decreased caregiver support      Equipment Recommendations  None recommended by PT    Recommendations for Other Services    Frequency Min 3X/week   Progress towards PT Goals Progress towards PT goals: Progressing toward goals  Plan Current plan remains appropriate    Precautions / Restrictions Precautions Precautions: Fall Precaution Comments: h/o MS Restrictions Weight Bearing Restrictions: Yes RLE Weight Bearing: Non weight bearing   Pertinent Vitals/Pain Pain is < 4 R ankle.    Mobility  Bed Mobility Overal bed mobility: Needs Assistance;+2 for physical assistance;+ 2 for safety/equipment Bed Mobility: Sit to Supine Supine to sit: +2 for safety/equipment;Mod assist;HOB elevated Sit to supine: +2 for physical assistance;+2 for safety/equipment;Max assist General bed mobility comments: support both legs onto bed. Transfers Overall transfer level: Needs assistance Equipment used: Rolling walker (2 wheeled) Transfers: Sit to/from [facility] Sit to Stand: +2 physical assistance;+2 safety/equipment;Max assist;From elevated surface Stand pivot transfers: +2 safety/equipment;+2 physical assistance;Max assist General transfer comment: pt stood  x 2 before able to begin a pivot to recliner. [place]le to take a few small hops  to turn. Ambulation/Gait Ambulation/Gait assistance: +2 physical assistance;+2  safety/equipment    Exercises     PT Diagnosis: Difficulty walking;Acute pain  PT Problem List: Decreased strength;Decreased activity tolerance;Decreased mobility;Decreased knowledge of precautions;Decreased safety awareness;Obesity;Decreased knowledge of use of DME;Decreased coordination;Pain PT Treatment Interventions: DME instruction;Functional mobility training;Therapeutic activities;Therapeutic exercise;Patient/family education   PT Goals (current goals can now be found in the care plan section) Acute Rehab PT Goals Patient Stated Goal: I want to get up if I can PT Goal Formulation: With patient Time For Goal Achievement: 11/14/13 Potential to Achieve Goals: Good  Visit Information  Last PT Received On: 10/31/13 Assistance Needed: +2 History of Present Illness: Pt has H/O MS. Fell at home.Sustained R medial malleolous and fibula. Non operative. In a splint.    Subjective Data  Patient Stated Goal: I want to get up if I can   Cognition  Cognition Arousal/Alertness: Awake/alert Behavior During Therapy: WFL for tasks assessed/performed Overall Cognitive Status: Within Functional Limits for tasks assessed    Balance     End of Session PT - End of Session Equipment Utilized During Treatment: Gait belt Activity Tolerance: Patient tolerated treatment well Patient left: in bed;with call bell/phone within reach;with family/visitor present Nurse Communication: Mobility status   GP     Toni [name], Toni [name] 10/31/2013, 2:29 PM

## 2013-10-31 NOTE — Progress Notes (Signed)
ANTICOAGULATION CONSULT NOTE - Initial Consult  Pharmacy Consult for Warfarin Indication: atrial fibrillation  Allergies  Allergen Reactions  . Hydrocodone     Hydrocodone causes vomiting  . Oxycodone     Oral oxycodone causes vomiting  . Chlorhexidine Gluconate     Sores at site  . Zoloft [Sertraline] Hives, Swelling and Rash    Patient Measurements: Height: 5\' 3"  (160 cm) Weight: 270 lb (122.471 kg) IBW/kg (Calculated) : 52.4   Vital Signs: Temp: 98.1 F (36.7 C) (03/13 0017) Temp src: Oral (03/13 0017) BP: 124/85 mmHg (03/13 0017) Pulse Rate: 77 (03/13 0045)  Labs:  Recent Labs  10/30/13 2155 10/30/13 2208  HGB 15.9* 17.0*  HCT 47.0* 50.0*  PLT 199  --   CREATININE  --  0.90    Estimated Creatinine Clearance: 78 ml/min (by C-G formula based on Cr of 0.9).   Medical History: Past Medical History  Diagnosis Date  . Multiple sclerosis     has had this may 1989-[facility] reg doc  . Hypertension   . PAF (paroxysmal atrial fibrillation)     on coumadin; documented on monitor 06/2010  . Thyroid disease   . HLD (hyperlipidemia)   . Shortness of breath   . Pneumonia   . Sleep apnea   . Hyperthyroidism   . Edema     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' ablation of R GSV 2012  . Depression   . Anxiety   . Headache(784.0)     MIGRAINE   . Chronic pain   . Hx of echocardiogram 04/22/10    EF 55-60%, no valve issues  . History of stress test 06/21/10    limited exam with some degree of breast attenuatin of the apex however a component of apical ischemia is not excluded, EF 62%; cardiac cath     Medications:  Scheduled:  . buPROPion  300 mg Oral Daily  . calcium carbonate  1,250 mg Oral BID WC  . cephALEXin  250 mg Oral QHS  . famotidine  20 mg Oral BID  . furosemide  20 mg Oral BID  . lactulose  10 g Oral BID  . levothyroxine  75 mcg Oral Daily  . loratadine  10 mg Oral Daily  . multivitamin with minerals  1 tablet Oral Daily  .  nebivolol  20 mg Oral Daily  . omega-3 acid ethyl esters  1 g Oral Daily  . pantoprazole  40 mg Oral Daily  . simvastatin  20 mg Oral QHS  . sodium chloride  3 mL Intravenous Q12H  . tamsulosin  0.4 mg Oral Daily   Infusions:    Assessment: 68 yo with hx of multiple sclerosis on chronic warfarin for A-fib. Goal of Therapy:  INR 2-3    Plan:   Warfarin 5 mg x1 tonight (~0215)  Daily PT/INR  Education  Jenniferann, [place] 10/31/2013,1:09 AM

## 2013-10-31 NOTE — Progress Notes (Signed)
Clinical Social Work Department BRIEF PSYCHOSOCIAL ASSESSMENT 10/31/2013  Patient:  ALEXXUS, SOBH     Account Number:  [id number]     Admit date:  10/30/2013  Clinical Social Worker:  Lacie Scotts  Date/Time:  10/31/2013 01:46 PM  Referred by:  Physician  Date Referred:  10/31/2013 Referred for  SNF Placement   Other Referral:   Interview type:  Patient Other interview type:    PSYCHOSOCIAL DATA Living Status:  HUSBAND Admitted from facility:   Level of care:   Primary support name:  Leward Quan Primary support relationship to patient:  SPOUSE Degree of support available:   supportive    CURRENT CONCERNS Current Concerns  Post-Acute Placement   Other Concerns:    SOCIAL WORK ASSESSMENT / PLAN Pt is a 68 yr old female living at home prior to hospitalization. CSW met with pt to assist with d/c planning. Pt fell and fx her ankle . Surgery has been consulted and surgery is not recommended at this time. PT has recommended ST Rehab placement. Pt is in agreement with this plan. SNF search has been initiated and bed offers provided. Pt is considering her options. CSW will continue to follow to assist with d/c planning.   Assessment/plan status:  Psychosocial Support/Ongoing Assessment of Needs Other assessment/ plan:   Information/referral to community resources:   Insurance coverage for SNF placement reviewed.    PATIENT'S/FAMILY'S RESPONSE TO PLAN OF CARE: Pt agrees rehab is needed. She has never been in placement before but is willing to give it a try. Pt appreciates assistance with placement process.    Werner Lean LCSW [phone]

## 2013-10-31 NOTE — Progress Notes (Signed)
Patient Demographics  Toni [name], is a 68 y.o. female, DOB - 11/16/1945, YQI:[id number]  Admit date - 10/30/2013   Admitting Physician Rise Patience, MD  Outpatient Primary MD for the patient is [name], MD  LOS - 1   Chief Complaint  Patient presents with  . Ankle Pain        Assessment & Plan    Right ankle fracture status post mechanical fall - patient has been placed on splint. Continue with pain relief medications. Physical therapy consult for further recommendations. Called her primary orthopedic surgeon Dr. Ninfa Linden see the patient today, she is nonweightbearing on the right leg, with underlying history of MS and generalized weakness most likely will require placement. PT and social work consulted.       Leukocytosis - probably reactionary. Patient was recently treated for pneumonia with Levaquin 2 weeks ago. Patient is also on chronic suppressive therapy for UTI on cephalexin. UA and chest x-ray are stable and patient is afebrile. Continue to monitor. Patient is presently afebrile.      Multiple sclerosis -chronic generalized weakness lower extremity is worse no symptoms to suggest acute exacerbation. PT evaluation.     History of Atrial fibrillation presently rate controlled - continue home dose nebivolol along with Coumadin per pharmacy.    Lab Results  Component Value Date   INR 2.29* 10/31/2013   INR 3 10/15/2013   INR 2.3 09/17/2013     Hypertension - continue home medications.       Hyperlipidemia - continue statins. OSA - CPAP per respiratory.     Hypothyroidism. home dose Synthroid continued.     Chronic diastolic dysfunction EF 38-75% on last echogram. Compensated, continue home dose Lasix which she titrates herself.     MS with  generalized weakness, new right ankle fracture, it unable to get out of the bed, on Lasix. Patient requesting Foley catheter for comfort. Will be placed. She accepts the risk of UTI.        Code Status: Full  Family Communication:    Disposition Plan: SNF   Procedures     Consults  OrthoNinfa Linden   Medications  Scheduled Meds: . buPROPion  300 mg Oral Daily  . calcium carbonate  1,250 mg Oral BID WC  . cephALEXin  250 mg Oral QHS  . docusate sodium  100 mg Oral BID  . famotidine  20 mg Oral BID  . furosemide  40 mg Oral BID  . lactulose  10 g Oral BID  . levothyroxine  75 mcg Oral Daily  . loratadine  10 mg Oral Daily  . multivitamin with minerals  1 tablet Oral Daily  . nebivolol  20 mg Oral Daily  . omega-3 acid ethyl esters  1 g Oral Daily  . pantoprazole  40 mg Oral Daily  . simvastatin  20 mg Oral QHS  . sodium chloride  3 mL Intravenous Q12H  . tamsulosin  0.4 mg Oral Daily  . Warfarin - Pharmacist Dosing Inpatient   Does not apply q1800   Continuous Infusions:  PRN Meds:.acetaminophen, acetaminophen, ALPRAZolam, gabapentin, morphine injection, ondansetron (ZOFRAN) IV, ondansetron, traMADol  DVT Prophylaxis  Coumadin  Lab Results  Component Value Date   INR 2.29* 10/31/2013  INR 3 10/15/2013   INR 2.3 09/17/2013     Lab Results  Component Value Date   PLT 185 10/31/2013    Antibiotics     Anti-infectives   Start     Dose/Rate Route Frequency Ordered Stop   10/31/13 0030  cephALEXin (KEFLEX) capsule 250 mg     250 mg Oral Daily at bedtime 10/31/13 0012            Subjective:   [facility] today has, No headache, No chest pain, No abdominal pain - No Nausea, No new weakness tingling or numbness, No Cough - SOB. Mild R ankle pain  Objective:   Filed Vitals:   10/31/13 0017 10/31/13 0045 10/31/13 0528 10/31/13 0906  BP: 124/85  138/81 138/72  Pulse: 79 77 81 86  Temp: 98.1 F (36.7 C)  98.3 F (36.8 C) 98.6 F (37 C)  TempSrc:  Oral  Oral Oral  Resp: 18 16 14 16   Height: 5\' 3"  (1.6 m)     Weight: 122.471 kg (270 lb)     SpO2: 95% 97% 94% 96%    Wt Readings from Last 3 Encounters:  10/31/13 122.471 kg (270 lb)  10/15/13 121.564 kg (268 lb)  06/11/13 121.564 kg (268 lb)     Intake/Output Summary (Last 24 hours) at 10/31/13 0951 Last data filed at 10/31/13 0200  Gross per 24 hour  Intake      0 ml  Output    150 ml  Net   -150 ml     Physical Exam  Awake Alert, Oriented X 3, No new F.N deficits, Normal affect, in her life weakness lower extremity more than upper secondary to underlying MS [place].AT,PERRAL Supple Neck,No JVD, No cervical lymphadenopathy appriciated.  Symmetrical Chest wall movement, Good air movement bilaterally, CTAB RRR,No Gallops,Rubs or new Murmurs, No Parasternal Heave +ve B.Sounds, Abd Soft, Non tender, No organomegaly appriciated, No rebound - guarding or rigidity. No Cyanosis, Clubbing or edema, No new Rash or bruise R ankle.leg in splint   Data Review   Micro Results No results found for this or any previous visit (from the past 240 hour(s)).  Radiology Reports Dg Ankle Complete Right  10/30/2013   CLINICAL DATA:  Fall.  Right ankle pain.  EXAM: RIGHT ANKLE - COMPLETE 3+ VIEW  COMPARISON:  None.  FINDINGS: There fractures of the medial malleolus and distal fibula. The median malleolar fracture is transverse across its base. The distal fibular fracture is oblique extending from the pressure a lateral mid-diaphysis to the medial metaphysis at the level of the ankle joint. There may be a posterior malleolar fracture along the posterior inferior corner of the distal tibia which is somewhat more equivocal.  Ankle mortise is normally space and aligned. There is minimal fracture displacement.  Bones are diffusely demineralized. There is calcaneal spurring and ossification along the distal Achilles tendon.  The soft tissues are diffusely edematous.  IMPRESSION: 1. Fracture of the base of  the medial malleolus and oblique fracture across the distal fibula. There is a possible posterior malleolar fracture. No significant fracture displacement. Ankle joint is normally aligned.   Electronically Signed   By: Lajean Manes M.D.   On: 10/30/2013 19:35   Dg Chest Port 1 View  10/31/2013   CLINICAL DATA:  Infiltrate  EXAM: PORTABLE CHEST - 1 VIEW  COMPARISON:  05/15/2013  FINDINGS: Cardiac silhouette enlarged mildly prominent mediastinal contours, similar prior. Mild central vascular congestion. No overt edema. No focal consolidation. No  pleural effusion or pneumothorax. Cervical fusion hardware. Bilateral acromioclavicular joint DJD. No acute osseous finding. Gentle rightward curvature of the thoracic spine and multilevel degenerative changes.  IMPRESSION: Prominent cardiomediastinal contours. No focal consolidation or overt edema.   Electronically Signed   By: Carlos Levering M.D.   On: 10/31/2013 00:44    CBC  Recent Labs Lab 10/30/13 2155 10/30/13 2208 10/31/13 0055  WBC 13.1*  --  13.8*  HGB 15.9* 17.0* 14.9  HCT 47.0* 50.0* 43.4  PLT 199  --  185  MCV 91.1  --  89.1  MCH 30.8  --  30.6  MCHC 33.8  --  34.3  RDW 12.3  --  12.3  LYMPHSABS 2.0  --   --   MONOABS 1.1*  --   --   EOSABS 0.1  --   --   BASOSABS 0.0  --   --     Chemistries   Recent Labs Lab 10/30/13 2208 10/31/13 0055  NA 141 139  K 3.6* 4.0  CL 96 97  CO2  --  28  GLUCOSE 111* 110*  BUN 18 16  CREATININE 0.90 0.65  CALCIUM  --  9.1   ------------------------------------------------------------------------------------------------------------------ estimated creatinine clearance is 87.8 ml/min (by C-G formula based on Cr of 0.65). ------------------------------------------------------------------------------------------------------------------ No results found for this basename: HGBA1C,  in the last 72  hours ------------------------------------------------------------------------------------------------------------------ No results found for this basename: CHOL, HDL, LDLCALC, TRIG, CHOLHDL, LDLDIRECT,  in the last 72 hours ------------------------------------------------------------------------------------------------------------------ No results found for this basename: TSH, T4TOTAL, FREET3, T3FREE, THYROIDAB,  in the last 72 hours ------------------------------------------------------------------------------------------------------------------ No results found for this basename: VITAMINB12, FOLATE, FERRITIN, TIBC, IRON, RETICCTPCT,  in the last 72 hours  Coagulation profile  Recent Labs Lab 10/31/13 0055  INR 2.29*    No results found for this basename: DDIMER,  in the last 72 hours  Cardiac Enzymes No results found for this basename: CK, CKMB, TROPONINI, MYOGLOBIN,  in the last 168 hours ------------------------------------------------------------------------------------------------------------------ No components found with this basename: POCBNP,      Time Spent in minutes   35   Lala Lund K M.D on 10/31/2013 at 9:51 AM  Between 7am to 7pm - Pager - [phone]  After 7pm go to [email or web address] - password [email or web address]  And look for the night coverage person covering for me after hours  [facility] Office  [phone]

## 2013-11-01 LAB — URINALYSIS, ROUTINE W REFLEX MICROSCOPIC
BILIRUBIN URINE: NEGATIVE
Glucose, UA: NEGATIVE mg/dL
Hgb urine dipstick: NEGATIVE
KETONES UR: NEGATIVE mg/dL
Leukocytes, UA: NEGATIVE
Nitrite: NEGATIVE
Protein, ur: NEGATIVE mg/dL
Specific Gravity, Urine: 1.014 (ref 1.005–1.030)
UROBILINOGEN UA: 0.2 mg/dL (ref 0.0–1.0)
pH: 5.5 (ref 5.0–8.0)

## 2013-11-01 LAB — PROTIME-INR
INR: 2.72 — ABNORMAL HIGH (ref 0.00–1.49)
Prothrombin Time: 27.9 seconds — ABNORMAL HIGH (ref 11.6–15.2)

## 2013-11-01 LAB — POTASSIUM: Potassium: 4 mEq/L (ref 3.7–5.3)

## 2013-11-01 LAB — MAGNESIUM: MAGNESIUM: 2.1 mg/dL (ref 1.5–2.5)

## 2013-11-01 MED ORDER — WARFARIN SODIUM 5 MG PO TABS
5.0000 mg | ORAL_TABLET | Freq: Once | ORAL | Status: AC
  Administered 2013-11-01: 5 mg via ORAL
  Filled 2013-11-01: qty 1

## 2013-11-01 NOTE — Progress Notes (Signed)
[place] for Warfarin Indication: atrial fibrillation  Allergies  Allergen Reactions  . Hydrocodone     Hydrocodone causes vomiting  . Oxycodone     Oral oxycodone causes vomiting  . Chlorhexidine Gluconate     Sores at site  . Zoloft [Sertraline] Hives, Swelling and Rash    Patient Measurements: Height: 5\' 3"  (160 cm) Weight: 270 lb (122.471 kg) IBW/kg (Calculated) : 52.4   Vital Signs: Temp: 98.2 F (36.8 C) (03/14 0608) Temp src: Oral (03/14 0608) BP: 121/71 mmHg (03/14 0608) Pulse Rate: 78 (03/14 0608)  Labs:  Recent Labs  10/30/13 2155 10/30/13 2208 10/31/13 0055 11/01/13 0520  HGB 15.9* 17.0* 14.9  --   HCT 47.0* 50.0* 43.4  --   PLT 199  --  185  --   LABPROT  --   --  24.5* 27.9*  INR  --   --  2.29* 2.72*  CREATININE  --  0.90 0.65  --     Estimated Creatinine Clearance: 87.8 ml/min (by C-G formula based on Cr of 0.65).   Medical History: Past Medical History  Diagnosis Date  . Multiple sclerosis     has had this may 1989-[facility] reg doc  . Hypertension   . PAF (paroxysmal atrial fibrillation)     on coumadin; documented on monitor 06/2010  . Thyroid disease   . HLD (hyperlipidemia)   . Shortness of breath   . Pneumonia   . Sleep apnea   . Hyperthyroidism   . Edema     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' ablation of R GSV 2012  . Depression   . Anxiety   . Headache(784.0)     MIGRAINE   . Chronic pain   . Hx of echocardiogram 04/22/10    EF 55-60%, no valve issues  . History of stress test 06/21/10    limited exam with some degree of breast attenuatin of the apex however a component of apical ischemia is not excluded, EF 62%; cardiac cath     Medications:  Scheduled:  . buPROPion  300 mg Oral Daily  . calcium carbonate  1,250 mg Oral BID WC  . cephALEXin  250 mg Oral QHS  . docusate sodium  100 mg Oral BID  . famotidine  20 mg Oral BID  . furosemide  40 mg Oral BID  .  lactulose  10 g Oral BID  . levothyroxine  75 mcg Oral Daily  . loratadine  10 mg Oral Daily  . multivitamin with minerals  1 tablet Oral Daily  . nebivolol  20 mg Oral Daily  . omega-3 acid ethyl esters  1 g Oral Daily  . pantoprazole  40 mg Oral Daily  . simvastatin  20 mg Oral QHS  . sodium chloride  3 mL Intravenous Q12H  . tamsulosin  0.4 mg Oral Daily  . Warfarin - Pharmacist Dosing Inpatient   Does not apply q1800   Infusions:    Assessment: 68 yo F with hx of multiple sclerosis, on chronic warfarin for A-fib, admitted with R ankle fracture after a fall. Orders were received to continue warfarin with pharmacy dosing assistance while inpatient.  Patient confirms PTA warfarin dosage reported as 5 mg daily and says her INR is usually stable on that dosage.  INR therapeutic (2.29) on admission.  3/14: INR remains therapeutic (2.72).  No CBC today.  Inpatient warfarin doses administered: 3/13 0200, 1800:  5 mg,  5mg   Goal of Therapy:  INR 2-3   Plan:   Repeat Warfarin 5 mg x1 tonight at 1800  Daily PT/INR while inpatient  Ralene Bathe, PharmD, BCPS 11/01/2013, 11:23 AM  Pager: [phone]

## 2013-11-01 NOTE — Progress Notes (Signed)
Physical Therapy Treatment Patient Details Name: Toni [name] MRN: [id number] DOB: 07-Jun-1946 Today's Date: 11/01/2013 Time: 1030-1057 PT Time Calculation (min): 27 min  PT Assessment / Plan / Recommendation  History of Present Illness Pt has H/O MS. Fell at home.Sustained R medial malleolous and fibula. Non operative. In a splint.   PT Comments   Assisted pt OOB to recliner then performed B LE TE's to tolerance.  Follow Up Recommendations  SNF     Does the patient have the potential to tolerate intense rehabilitation     Barriers to Discharge        Equipment Recommendations  None recommended by PT    Recommendations for Other Services    Frequency Min 3X/week   Progress towards PT Goals Progress towards PT goals: Progressing toward goals  Plan      Precautions / Restrictions Precautions Precautions: Fall Precaution Comments: h/o MS Restrictions Weight Bearing Restrictions: Yes RLE Weight Bearing: Non weight bearing   Pertinent Vitals/Pain     Mobility  Bed Mobility Overal bed mobility: Needs Assistance Bed Mobility: Supine to Sit Supine to sit: Mod assist General bed mobility comments: support R LE and increased time.  Pt was able to get self to EOB using rail and HOB elevated. Transfers Overall transfer level: Needs assistance Equipment used: None Transfers: Stand Pivot Transfers Sit to Stand: +2 physical assistance;+2 safety/equipment;Max assist;From elevated surface General transfer comment: pt performed stand pivot 1/4 turn to her L from bed to recliner + 2 assist for safety partial completion requiring extra assist to guide buttocks to center of chair.     Exercises  10 reps B AP (toe wiggles R LE) 10 reps B knee presses 10 reps B LE towel squeezes    PT Goals (current goals can now be found in the care plan section)    Visit Information  Last PT Received On: 11/01/13 Assistance Needed: +2 History of Present Illness: Pt has H/O MS. Fell at  home.Sustained R medial malleolous and fibula. Non operative. In a splint.    Subjective Data      Cognition       Balance     End of Session PT - End of Session Equipment Utilized During Treatment: Gait belt Activity Tolerance: Patient limited by fatigue;Patient limited by pain;Treatment limited secondary to medical complications (Comment) (MS with c/o ABD spasms) Patient left: in chair;with call bell/phone within reach   Rica Koyanagi  PTA [facility]  Acute  Rehab Pager      [phone]

## 2013-11-01 NOTE — Progress Notes (Signed)
Pt very focused on inability to "empty Bladder" believes that she has a "bladder infection" as she has burning at the [facility]. Refuses to have staff get pt OOB to [facility]. We talked about gravity and developed a plan to assist her to void and send for UA.

## 2013-11-01 NOTE — Progress Notes (Signed)
Spoke with patient in regards to readiness for nighttime CPAP, states preference to self administer when ready, patient understands to call for RT if any additional assistance needed.

## 2013-11-01 NOTE — Progress Notes (Signed)
Patient Demographics  Toni [name], is a 68 y.o. female, DOB - [birth year], CXK:[id number]  Admit date - 10/30/2013   Admitting Physician Rise Patience, MD  Outpatient Primary MD for the patient is [name], MD  LOS - 2   Chief Complaint  Patient presents with  . Ankle Pain        Assessment & Plan    Right ankle fracture status post mechanical fall - patient has been placed on splint. Continue with pain relief medications. Physical therapy consult for further recommendations. Orthopedic surgeon Dr. Renard Hamper man following, may need surgery in the outpatient setting, she is nonweightbearing on the right leg, with underlying history of MS and generalized weakness most likely will require placement. PT and social work consulted.       Leukocytosis - probably reactionary. Patient was recently treated for pneumonia with Levaquin 2 weeks ago. Patient is also on chronic suppressive therapy for UTI on cephalexin. UA and chest x-ray are stable and patient is afebrile. Continue to monitor. Patient is presently afebrile. Repeat CBC in the morning.      Multiple sclerosis -chronic generalized weakness lower extremity is worse no symptoms to suggest acute exacerbation. PT evaluation.     History of Atrial fibrillation presently rate controlled - continue home dose nebivolol along with Coumadin per pharmacy.    Lab Results  Component Value Date   INR 2.72* 11/01/2013   INR 2.29* 10/31/2013   INR 3 10/15/2013      Hypertension - continue home dose Bysystolic.      Hyperlipidemia - continue statins.      OSA - CPAP per respiratory QHS.     Hypothyroidism. home dose Synthroid continued.     Chronic diastolic dysfunction EF 97-02% on last echogram. Compensated,  continue home dose Lasix which she titrates herself.      MS with generalized weakness, new right ankle fracture, it unable to get out of the bed, on Lasix as needed. Foley was placed on 10/31/2013 for one day on patient request. Will be removed today.      Code Status: Full  Family Communication:    Disposition Plan: SNF   Procedures     Consults  OrthoNinfa Linden   Medications  Scheduled Meds: . buPROPion  300 mg Oral Daily  . calcium carbonate  1,250 mg Oral BID WC  . cephALEXin  250 mg Oral QHS  . docusate sodium  100 mg Oral BID  . famotidine  20 mg Oral BID  . furosemide  40 mg Oral BID  . lactulose  10 g Oral BID  . levothyroxine  75 mcg Oral Daily  . loratadine  10 mg Oral Daily  . multivitamin with minerals  1 tablet Oral Daily  . nebivolol  20 mg Oral Daily  . omega-3 acid ethyl esters  1 g Oral Daily  . pantoprazole  40 mg Oral Daily  . simvastatin  20 mg Oral QHS  . sodium chloride  3 mL Intravenous Q12H  . tamsulosin  0.4 mg Oral Daily  . Warfarin - Pharmacist Dosing Inpatient   Does not apply q1800   Continuous Infusions:  PRN Meds:.acetaminophen, acetaminophen, ALPRAZolam, baclofen, gabapentin, morphine injection, ondansetron (ZOFRAN) IV, ondansetron, traMADol  DVT  Prophylaxis  Coumadin  Lab Results  Component Value Date   INR 2.72* 11/01/2013   INR 2.29* 10/31/2013   INR 3 10/15/2013     Lab Results  Component Value Date   PLT 185 10/31/2013    Antibiotics     Anti-infectives   Start     Dose/Rate Route Frequency Ordered Stop   10/31/13 0030  cephALEXin (KEFLEX) capsule 250 mg     250 mg Oral Daily at bedtime 10/31/13 0012            Subjective:   [name] today has, No headache, No chest pain, No abdominal pain - No Nausea, No new weakness tingling or numbness, No Cough - SOB. Mild R ankle pain  Objective:   Filed Vitals:   10/31/13 0906 10/31/13 1410 10/31/13 2138 11/01/13 0608  BP: 138/72 114/72 142/80 121/71    Pulse: 86 84 82 78  Temp: 98.6 F (37 C) 98 F (36.7 C) 98.2 F (36.8 C) 98.2 F (36.8 C)  TempSrc: Oral  Oral Oral  Resp: 16 16 16 16   Height:      Weight:      SpO2: 96% 97% 98% 93%    Wt Readings from Last 3 Encounters:  10/31/13 122.471 kg (270 lb)  10/15/13 121.564 kg (268 lb)  06/11/13 121.564 kg (268 lb)     Intake/Output Summary (Last 24 hours) at 11/01/13 1028 Last data filed at 11/01/13 1000  Gross per 24 hour  Intake    723 ml  Output   3625 ml  Net  -2902 ml     Physical Exam  Awake Alert, Oriented X 3, No new F.N deficits, Normal affect, in her life weakness lower extremity more than upper secondary to underlying MS [place].AT,PERRAL Supple Neck,No JVD, No cervical lymphadenopathy appriciated.  Symmetrical Chest wall movement, Good air movement bilaterally, CTAB RRR,No Gallops,Rubs or new Murmurs, No Parasternal Heave +ve B.Sounds, Abd Soft, Non tender, No organomegaly appriciated, No rebound - guarding or rigidity. No Cyanosis, Clubbing or edema, No new Rash or bruise R ankle.leg in splint   Data Review   Micro Results No results found for this or any previous visit (from the past 240 hour(s)).  Radiology Reports Dg Ankle Complete Right  10/30/2013   CLINICAL DATA:  Fall.  Right ankle pain.  EXAM: RIGHT ANKLE - COMPLETE 3+ VIEW  COMPARISON:  None.  FINDINGS: There fractures of the medial malleolus and distal fibula. The median malleolar fracture is transverse across its base. The distal fibular fracture is oblique extending from the pressure a lateral mid-diaphysis to the medial metaphysis at the level of the ankle joint. There may be a posterior malleolar fracture along the posterior inferior corner of the distal tibia which is somewhat more equivocal.  Ankle mortise is normally space and aligned. There is minimal fracture displacement.  Bones are diffusely demineralized. There is calcaneal spurring and ossification along the distal Achilles tendon.  The  soft tissues are diffusely edematous.  IMPRESSION: 1. Fracture of the base of the medial malleolus and oblique fracture across the distal fibula. There is a possible posterior malleolar fracture. No significant fracture displacement. Ankle joint is normally aligned.   Electronically Signed   By: Lajean Manes M.D.   On: 10/30/2013 19:35   Dg Chest Port 1 View  10/31/2013   CLINICAL DATA:  Infiltrate  EXAM: PORTABLE CHEST - 1 VIEW  COMPARISON:  05/15/2013  FINDINGS: Cardiac silhouette enlarged mildly prominent mediastinal contours, similar prior.  Mild central vascular congestion. No overt edema. No focal consolidation. No pleural effusion or pneumothorax. Cervical fusion hardware. Bilateral acromioclavicular joint DJD. No acute osseous finding. Gentle rightward curvature of the thoracic spine and multilevel degenerative changes.  IMPRESSION: Prominent cardiomediastinal contours. No focal consolidation or overt edema.   Electronically Signed   By: Carlos Levering M.D.   On: 10/31/2013 00:44    CBC  Recent Labs Lab 10/30/13 2155 10/30/13 2208 10/31/13 0055  WBC 13.1*  --  13.8*  HGB 15.9* 17.0* 14.9  HCT 47.0* 50.0* 43.4  PLT 199  --  185  MCV 91.1  --  89.1  MCH 30.8  --  30.6  MCHC 33.8  --  34.3  RDW 12.3  --  12.3  LYMPHSABS 2.0  --   --   MONOABS 1.1*  --   --   EOSABS 0.1  --   --   BASOSABS 0.0  --   --     Chemistries   Recent Labs Lab 10/30/13 2208 10/31/13 0055 11/01/13 0923  NA 141 139  --   K 3.6* 4.0 4.0  CL 96 97  --   CO2  --  28  --   GLUCOSE 111* 110*  --   BUN 18 16  --   CREATININE 0.90 0.65  --   CALCIUM  --  9.1  --   MG  --   --  2.1   ------------------------------------------------------------------------------------------------------------------ estimated creatinine clearance is 87.8 ml/min (by C-G formula based on Cr of 0.65). ------------------------------------------------------------------------------------------------------------------ No  results found for this basename: HGBA1C,  in the last 72 hours ------------------------------------------------------------------------------------------------------------------ No results found for this basename: CHOL, HDL, LDLCALC, TRIG, CHOLHDL, LDLDIRECT,  in the last 72 hours ------------------------------------------------------------------------------------------------------------------ No results found for this basename: TSH, T4TOTAL, FREET3, T3FREE, THYROIDAB,  in the last 72 hours ------------------------------------------------------------------------------------------------------------------ No results found for this basename: VITAMINB12, FOLATE, FERRITIN, TIBC, IRON, RETICCTPCT,  in the last 72 hours  Coagulation profile  Recent Labs Lab 10/31/13 0055 11/01/13 0520  INR 2.29* 2.72*    No results found for this basename: DDIMER,  in the last 72 hours  Cardiac Enzymes No results found for this basename: CK, CKMB, TROPONINI, MYOGLOBIN,  in the last 168 hours ------------------------------------------------------------------------------------------------------------------ No components found with this basename: POCBNP,      Time Spent in minutes   35   Lala Lund K M.D on 11/01/2013 at 10:28 AM  Between 7am to 7pm - Pager - [phone]  After 7pm go to [email or web address] - password [email or web address]  And look for the night coverage person covering for me after hours  [facility] Office  [phone]

## 2013-11-02 LAB — CBC
HCT: 43.1 % (ref 36.0–46.0)
Hemoglobin: 14.7 g/dL (ref 12.0–15.0)
MCH: 30.6 pg (ref 26.0–34.0)
MCHC: 34.1 g/dL (ref 30.0–36.0)
MCV: 89.8 fL (ref 78.0–100.0)
PLATELETS: 196 10*3/uL (ref 150–400)
RBC: 4.8 MIL/uL (ref 3.87–5.11)
RDW: 12.4 % (ref 11.5–15.5)
WBC: 10.8 10*3/uL — ABNORMAL HIGH (ref 4.0–10.5)

## 2013-11-02 LAB — PROTIME-INR
INR: 3.24 — ABNORMAL HIGH (ref 0.00–1.49)
Prothrombin Time: 31.9 seconds — ABNORMAL HIGH (ref 11.6–15.2)

## 2013-11-02 MED ORDER — FUROSEMIDE 40 MG PO TABS
40.0000 mg | ORAL_TABLET | ORAL | Status: DC
  Administered 2013-11-03: 40 mg via ORAL
  Filled 2013-11-02 (×3): qty 1

## 2013-11-02 MED ORDER — MAGNESIUM HYDROXIDE 400 MG/5ML PO SUSP
30.0000 mL | Freq: Every day | ORAL | Status: DC | PRN
Start: 2013-11-02 — End: 2013-11-03
  Administered 2013-11-02 – 2013-11-03 (×2): 30 mL via ORAL
  Filled 2013-11-02 (×2): qty 30

## 2013-11-02 MED ORDER — WARFARIN SODIUM 2.5 MG PO TABS
2.5000 mg | ORAL_TABLET | Freq: Once | ORAL | Status: AC
  Administered 2013-11-02: 2.5 mg via ORAL
  Filled 2013-11-02: qty 1

## 2013-11-02 NOTE — Progress Notes (Signed)
Patient Demographics  Toni [name], is a 68 y.o. female, DOB - 14-Jul-1946, STM:[id number]  Admit date - 10/30/2013   Admitting Physician Rise Patience, MD  Outpatient Primary MD for the patient is Toni Ringer, MD  LOS - 3   Chief Complaint  Patient presents with  . Ankle Pain        Assessment & Plan    Right ankle fracture status post mechanical fall - patient has been placed on splint. Continue with pain relief medications. Physical therapy consult for further recommendations. Orthopedic surgeon Dr. Renard Hamper man following, may need surgery in the outpatient setting, she is nonweightbearing on the right leg, with underlying history of MS and generalized weakness most likely will require placement. PT and social work consulted.       Leukocytosis - probably reactionary. Patient was recently treated for pneumonia with Levaquin 2 weeks ago. Patient is also on chronic suppressive therapy for UTI on cephalexin. UA and chest x-ray are stable and patient is afebrile. Continue to monitor. Patient is presently afebrile. Repeat CBC in the morning.      Multiple sclerosis -chronic generalized weakness lower extremity is worse no symptoms to suggest acute exacerbation. PT evaluation. Will discuss with her neurologist  Dr. Beacher May [phone] the need of using Acthar in the next 4-6 weeks the      History of Atrial fibrillation presently rate controlled - continue home dose nebivolol along with Coumadin per pharmacy.    Lab Results  Component Value Date   INR 3.24* 11/02/2013   INR 2.72* 11/01/2013   INR 2.29* 10/31/2013      Hypertension - continue home dose Bysystolic.      Hyperlipidemia - continue statins.      OSA - CPAP per respiratory QHS.     Hypothyroidism.  home dose Synthroid continued.     Chronic diastolic dysfunction EF 17-40% on last echogram. Compensated, continue home dose Lasix which she titrates herself.      MS with generalized weakness, new right ankle fracture, it unable to get out of the bed, on Lasix as needed. Foley was placed on 10/31/2013 for one day on patient request. Will be removed today.      Code Status: Full  Family Communication:    Disposition Plan: SNF   Procedures     Consults  OrthoNinfa Linden   Medications  Scheduled Meds: . buPROPion  300 mg Oral Daily  . calcium carbonate  1,250 mg Oral BID WC  . cephALEXin  250 mg Oral QHS  . docusate sodium  100 mg Oral BID  . famotidine  20 mg Oral BID  . furosemide  40 mg Oral BID  . lactulose  10 g Oral BID  . levothyroxine  75 mcg Oral Daily  . loratadine  10 mg Oral Daily  . multivitamin with minerals  1 tablet Oral Daily  . nebivolol  20 mg Oral Daily  . omega-3 acid ethyl esters  1 g Oral Daily  . pantoprazole  40 mg Oral Daily  . simvastatin  20 mg Oral QHS  . sodium chloride  3 mL Intravenous Q12H  . tamsulosin  0.4 mg Oral Daily  . Warfarin - Pharmacist Dosing Inpatient   Does not  apply q1800   Continuous Infusions:  PRN Meds:.acetaminophen, acetaminophen, ALPRAZolam, baclofen, gabapentin, morphine injection, ondansetron (ZOFRAN) IV, ondansetron, traMADol  DVT Prophylaxis  Coumadin  Lab Results  Component Value Date   INR 3.24* 11/02/2013   INR 2.72* 11/01/2013   INR 2.29* 10/31/2013     Lab Results  Component Value Date   PLT 196 11/02/2013    Antibiotics     Anti-infectives   Start     Dose/Rate Route Frequency Ordered Stop   10/31/13 0030  cephALEXin (KEFLEX) capsule 250 mg     250 mg Oral Daily at bedtime 10/31/13 0012            Subjective:   [facility] today has, No headache, No chest pain, No abdominal pain - No Nausea, No new weakness tingling or numbness, No Cough - SOB. Mild R ankle pain  Objective:     Filed Vitals:   11/01/13 0608 11/01/13 1424 11/01/13 2035 11/02/13 0500  BP: 121/71 137/81 136/81 115/69  Pulse: 78 75 82 85  Temp: 98.2 F (36.8 C) 98.5 F (36.9 C) 98.6 F (37 C) 98.1 F (36.7 C)  TempSrc: Oral Axillary Axillary Oral  Resp: 16 16 16 16   Height:      Weight:      SpO2: 93% 95% 98% 96%    Wt Readings from Last 3 Encounters:  10/31/13 122.471 kg (270 lb)  10/15/13 121.564 kg (268 lb)  06/11/13 121.564 kg (268 lb)     Intake/Output Summary (Last 24 hours) at 11/02/13 0930 Last data filed at 11/02/13 0905  Gross per 24 hour  Intake    603 ml  Output   1050 ml  Net   -447 ml     Physical Exam  Awake Alert, Oriented X 3, No new F.N deficits, Normal affect, in her life weakness lower extremity more than upper secondary to underlying MS [place].AT,PERRAL Supple Neck,No JVD, No cervical lymphadenopathy appriciated.  Symmetrical Chest wall movement, Good air movement bilaterally, CTAB RRR,No Gallops,Rubs or new Murmurs, No Parasternal Heave +ve B.Sounds, Abd Soft, Non tender, No organomegaly appriciated, No rebound - guarding or rigidity. No Cyanosis, Clubbing or edema, No new Rash or bruise R ankle.leg in splint   Data Review   Micro Results No results found for this or any previous visit (from the past 240 hour(s)).  Radiology Reports Dg Ankle Complete Right  10/30/2013   CLINICAL DATA:  Fall.  Right ankle pain.  EXAM: RIGHT ANKLE - COMPLETE 3+ VIEW  COMPARISON:  None.  FINDINGS: There fractures of the medial malleolus and distal fibula. The median malleolar fracture is transverse across its base. The distal fibular fracture is oblique extending from the pressure a lateral mid-diaphysis to the medial metaphysis at the level of the ankle joint. There may be a posterior malleolar fracture along the posterior inferior corner of the distal tibia which is somewhat more equivocal.  Ankle mortise is normally space and aligned. There is minimal fracture  displacement.  Bones are diffusely demineralized. There is calcaneal spurring and ossification along the distal Achilles tendon.  The soft tissues are diffusely edematous.  IMPRESSION: 1. Fracture of the base of the medial malleolus and oblique fracture across the distal fibula. There is a possible posterior malleolar fracture. No significant fracture displacement. Ankle joint is normally aligned.   Electronically Signed   By: Lajean Manes M.D.   On: 10/30/2013 19:35   Dg Chest Port 1 View  10/31/2013   CLINICAL DATA:  Infiltrate  EXAM: PORTABLE CHEST - 1 VIEW  COMPARISON:  05/15/2013  FINDINGS: Cardiac silhouette enlarged mildly prominent mediastinal contours, similar prior. Mild central vascular congestion. No overt edema. No focal consolidation. No pleural effusion or pneumothorax. Cervical fusion hardware. Bilateral acromioclavicular joint DJD. No acute osseous finding. Gentle rightward curvature of the thoracic spine and multilevel degenerative changes.  IMPRESSION: Prominent cardiomediastinal contours. No focal consolidation or overt edema.   Electronically Signed   By: Carlos Levering M.D.   On: 10/31/2013 00:44    CBC  Recent Labs Lab 10/30/13 2155 10/30/13 2208 10/31/13 0055 11/02/13 0543  WBC 13.1*  --  13.8* 10.8*  HGB 15.9* 17.0* 14.9 14.7  HCT 47.0* 50.0* 43.4 43.1  PLT 199  --  185 196  MCV 91.1  --  89.1 89.8  MCH 30.8  --  30.6 30.6  MCHC 33.8  --  34.3 34.1  RDW 12.3  --  12.3 12.4  LYMPHSABS 2.0  --   --   --   MONOABS 1.1*  --   --   --   EOSABS 0.1  --   --   --   BASOSABS 0.0  --   --   --     Chemistries   Recent Labs Lab 10/30/13 2208 10/31/13 0055 11/01/13 0923  NA 141 139  --   K 3.6* 4.0 4.0  CL 96 97  --   CO2  --  28  --   GLUCOSE 111* 110*  --   BUN 18 16  --   CREATININE 0.90 0.65  --   CALCIUM  --  9.1  --   MG  --   --  2.1    ------------------------------------------------------------------------------------------------------------------ estimated creatinine clearance is 87.8 ml/min (by C-G formula based on Cr of 0.65). ------------------------------------------------------------------------------------------------------------------ No results found for this basename: HGBA1C,  in the last 72 hours ------------------------------------------------------------------------------------------------------------------ No results found for this basename: CHOL, HDL, LDLCALC, TRIG, CHOLHDL, LDLDIRECT,  in the last 72 hours ------------------------------------------------------------------------------------------------------------------ No results found for this basename: TSH, T4TOTAL, FREET3, T3FREE, THYROIDAB,  in the last 72 hours ------------------------------------------------------------------------------------------------------------------ No results found for this basename: VITAMINB12, FOLATE, FERRITIN, TIBC, IRON, RETICCTPCT,  in the last 72 hours  Coagulation profile  Recent Labs Lab 10/31/13 0055 11/01/13 0520 11/02/13 0543  INR 2.29* 2.72* 3.24*    No results found for this basename: DDIMER,  in the last 72 hours  Cardiac Enzymes No results found for this basename: CK, CKMB, TROPONINI, MYOGLOBIN,  in the last 168 hours ------------------------------------------------------------------------------------------------------------------ No components found with this basename: POCBNP,      Time Spent in minutes   35   Makelle Marrone K M.D on 11/02/2013 at 9:30 AM  Between 7am to 7pm - Pager - [phone]  After 7pm go to [email or web address] - password [email or web address]  And look for the night coverage person covering for me after hours  [facility] Office  [phone]

## 2013-11-02 NOTE — Progress Notes (Signed)
[place] for Warfarin Indication: atrial fibrillation  Allergies  Allergen Reactions  . Hydrocodone     Hydrocodone causes vomiting  . Oxycodone     Oral oxycodone causes vomiting  . Chlorhexidine Gluconate     Sores at site  . Zoloft [Sertraline] Hives, Swelling and Rash    Patient Measurements: Height: 5\' 3"  (160 cm) Weight: 270 lb (122.471 kg) IBW/kg (Calculated) : 52.4   Vital Signs: Temp: 98.1 F (36.7 C) (03/15 0500) Temp src: Oral (03/15 0500) BP: 115/69 mmHg (03/15 0500) Pulse Rate: 85 (03/15 0500)  Labs:  Recent Labs  10/30/13 2155 10/30/13 2208 10/31/13 0055 11/01/13 0520 11/02/13 0543  HGB 15.9* 17.0* 14.9  --  14.7  HCT 47.0* 50.0* 43.4  --  43.1  PLT 199  --  185  --  196  LABPROT  --   --  24.5* 27.9* 31.9*  INR  --   --  2.29* 2.72* 3.24*  CREATININE  --  0.90 0.65  --   --     Estimated Creatinine Clearance: 87.8 ml/min (by C-G formula based on Cr of 0.65).   Medical History: Past Medical History  Diagnosis Date  . Multiple sclerosis     has had this may 1989-[facility] reg doc  . Hypertension   . PAF (paroxysmal atrial fibrillation)     on coumadin; documented on monitor 06/2010  . Thyroid disease   . HLD (hyperlipidemia)   . Shortness of breath   . Pneumonia   . Sleep apnea   . Hyperthyroidism   . Edema     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' ablation of R GSV 2012  . Depression   . Anxiety   . Headache(784.0)     MIGRAINE   . Chronic pain   . Hx of echocardiogram 04/22/10    EF 55-60%, no valve issues  . History of stress test 06/21/10    limited exam with some degree of breast attenuatin of the apex however a component of apical ischemia is not excluded, EF 62%; cardiac cath     Medications:  Scheduled:  . buPROPion  300 mg Oral Daily  . calcium carbonate  1,250 mg Oral BID WC  . cephALEXin  250 mg Oral QHS  . docusate sodium  100 mg Oral BID  . famotidine  20 mg  Oral BID  . furosemide  40 mg Oral BID  . lactulose  10 g Oral BID  . levothyroxine  75 mcg Oral Daily  . loratadine  10 mg Oral Daily  . multivitamin with minerals  1 tablet Oral Daily  . nebivolol  20 mg Oral Daily  . omega-3 acid ethyl esters  1 g Oral Daily  . pantoprazole  40 mg Oral Daily  . simvastatin  20 mg Oral QHS  . sodium chloride  3 mL Intravenous Q12H  . tamsulosin  0.4 mg Oral Daily  . Warfarin - Pharmacist Dosing Inpatient   Does not apply q1800   Infusions:    Assessment: 68 yo F with hx of multiple sclerosis, on chronic warfarin for A-fib, admitted with R ankle fracture after a fall. Orders were received to continue warfarin with pharmacy dosing assistance while inpatient.  Patient confirms PTA warfarin dosage reported as 5 mg daily and says her INR is usually stable on that dosage.  INR therapeutic (2.29) on admission.  3/14: INR slightly supratherapeutic at 3.24.  CBC - no  issues noted.   Inpatient warfarin doses administered: 3/13-3/14:  5 mg, 5mg , 5mg   Goal of Therapy:  INR 2-3   Plan:   Warfarin 2.5 mg x1 tonight at 1800  Daily PT/INR while inpatient  Ralene Bathe, PharmD, BCPS 11/02/2013, 10:43 AM  Pager: [phone]

## 2013-11-03 ENCOUNTER — Telehealth: Payer: Self-pay | Admitting: Neurology

## 2013-11-03 DIAGNOSIS — F3289 Other specified depressive episodes: Secondary | ICD-10-CM | POA: Diagnosis not present

## 2013-11-03 DIAGNOSIS — M25473 Effusion, unspecified ankle: Secondary | ICD-10-CM | POA: Diagnosis not present

## 2013-11-03 DIAGNOSIS — Z9889 Other specified postprocedural states: Secondary | ICD-10-CM | POA: Diagnosis not present

## 2013-11-03 DIAGNOSIS — D72829 Elevated white blood cell count, unspecified: Secondary | ICD-10-CM | POA: Diagnosis not present

## 2013-11-03 DIAGNOSIS — S8290XD Unspecified fracture of unspecified lower leg, subsequent encounter for closed fracture with routine healing: Secondary | ICD-10-CM | POA: Diagnosis not present

## 2013-11-03 DIAGNOSIS — Z7901 Long term (current) use of anticoagulants: Secondary | ICD-10-CM | POA: Diagnosis not present

## 2013-11-03 DIAGNOSIS — F411 Generalized anxiety disorder: Secondary | ICD-10-CM | POA: Diagnosis not present

## 2013-11-03 DIAGNOSIS — E669 Obesity, unspecified: Secondary | ICD-10-CM | POA: Diagnosis not present

## 2013-11-03 DIAGNOSIS — M6281 Muscle weakness (generalized): Secondary | ICD-10-CM | POA: Diagnosis not present

## 2013-11-03 DIAGNOSIS — Z79899 Other long term (current) drug therapy: Secondary | ICD-10-CM | POA: Diagnosis not present

## 2013-11-03 DIAGNOSIS — M79609 Pain in unspecified limb: Secondary | ICD-10-CM | POA: Diagnosis not present

## 2013-11-03 DIAGNOSIS — M546 Pain in thoracic spine: Secondary | ICD-10-CM | POA: Diagnosis not present

## 2013-11-03 DIAGNOSIS — I10 Essential (primary) hypertension: Secondary | ICD-10-CM | POA: Diagnosis not present

## 2013-11-03 DIAGNOSIS — S82409A Unspecified fracture of shaft of unspecified fibula, initial encounter for closed fracture: Secondary | ICD-10-CM | POA: Diagnosis not present

## 2013-11-03 DIAGNOSIS — E0590 Thyrotoxicosis, unspecified without thyrotoxic crisis or storm: Secondary | ICD-10-CM | POA: Diagnosis not present

## 2013-11-03 DIAGNOSIS — G35 Multiple sclerosis: Secondary | ICD-10-CM | POA: Diagnosis not present

## 2013-11-03 DIAGNOSIS — E039 Hypothyroidism, unspecified: Secondary | ICD-10-CM | POA: Diagnosis not present

## 2013-11-03 DIAGNOSIS — R52 Pain, unspecified: Secondary | ICD-10-CM | POA: Diagnosis not present

## 2013-11-03 DIAGNOSIS — R209 Unspecified disturbances of skin sensation: Secondary | ICD-10-CM | POA: Diagnosis not present

## 2013-11-03 DIAGNOSIS — Z792 Long term (current) use of antibiotics: Secondary | ICD-10-CM | POA: Diagnosis not present

## 2013-11-03 DIAGNOSIS — G35D Multiple sclerosis, unspecified: Secondary | ICD-10-CM | POA: Diagnosis not present

## 2013-11-03 DIAGNOSIS — F329 Major depressive disorder, single episode, unspecified: Secondary | ICD-10-CM | POA: Diagnosis not present

## 2013-11-03 DIAGNOSIS — R609 Edema, unspecified: Secondary | ICD-10-CM | POA: Diagnosis not present

## 2013-11-03 DIAGNOSIS — S8990XA Unspecified injury of unspecified lower leg, initial encounter: Secondary | ICD-10-CM | POA: Diagnosis not present

## 2013-11-03 DIAGNOSIS — R339 Retention of urine, unspecified: Secondary | ICD-10-CM | POA: Diagnosis not present

## 2013-11-03 DIAGNOSIS — Z5189 Encounter for other specified aftercare: Secondary | ICD-10-CM | POA: Diagnosis not present

## 2013-11-03 DIAGNOSIS — S82853A Displaced trimalleolar fracture of unspecified lower leg, initial encounter for closed fracture: Secondary | ICD-10-CM | POA: Diagnosis not present

## 2013-11-03 DIAGNOSIS — Z87891 Personal history of nicotine dependence: Secondary | ICD-10-CM | POA: Diagnosis not present

## 2013-11-03 DIAGNOSIS — IMO0001 Reserved for inherently not codable concepts without codable children: Secondary | ICD-10-CM | POA: Diagnosis not present

## 2013-11-03 DIAGNOSIS — G609 Hereditary and idiopathic neuropathy, unspecified: Secondary | ICD-10-CM | POA: Diagnosis not present

## 2013-11-03 DIAGNOSIS — M25579 Pain in unspecified ankle and joints of unspecified foot: Secondary | ICD-10-CM | POA: Diagnosis not present

## 2013-11-03 DIAGNOSIS — T367X1A Poisoning by antifungal antibiotics, systemically used, accidental (unintentional), initial encounter: Secondary | ICD-10-CM | POA: Diagnosis not present

## 2013-11-03 DIAGNOSIS — Z8701 Personal history of pneumonia (recurrent): Secondary | ICD-10-CM | POA: Diagnosis not present

## 2013-11-03 DIAGNOSIS — S82899A Other fracture of unspecified lower leg, initial encounter for closed fracture: Secondary | ICD-10-CM | POA: Diagnosis not present

## 2013-11-03 DIAGNOSIS — G43909 Migraine, unspecified, not intractable, without status migrainosus: Secondary | ICD-10-CM | POA: Diagnosis not present

## 2013-11-03 DIAGNOSIS — S82843A Displaced bimalleolar fracture of unspecified lower leg, initial encounter for closed fracture: Secondary | ICD-10-CM | POA: Diagnosis not present

## 2013-11-03 DIAGNOSIS — K219 Gastro-esophageal reflux disease without esophagitis: Secondary | ICD-10-CM | POA: Diagnosis not present

## 2013-11-03 DIAGNOSIS — G4730 Sleep apnea, unspecified: Secondary | ICD-10-CM | POA: Diagnosis not present

## 2013-11-03 DIAGNOSIS — K5900 Constipation, unspecified: Secondary | ICD-10-CM | POA: Diagnosis not present

## 2013-11-03 DIAGNOSIS — G8929 Other chronic pain: Secondary | ICD-10-CM | POA: Diagnosis not present

## 2013-11-03 DIAGNOSIS — E785 Hyperlipidemia, unspecified: Secondary | ICD-10-CM | POA: Diagnosis not present

## 2013-11-03 DIAGNOSIS — I4891 Unspecified atrial fibrillation: Secondary | ICD-10-CM | POA: Diagnosis not present

## 2013-11-03 DIAGNOSIS — R252 Cramp and spasm: Secondary | ICD-10-CM | POA: Diagnosis not present

## 2013-11-03 LAB — PROTIME-INR
INR: 3.17 — ABNORMAL HIGH (ref 0.00–1.49)
Prothrombin Time: 31.4 seconds — ABNORMAL HIGH (ref 11.6–15.2)

## 2013-11-03 MED ORDER — WARFARIN SODIUM 2 MG PO TABS
2.0000 mg | ORAL_TABLET | Freq: Once | ORAL | Status: DC
  Filled 2013-11-03: qty 1

## 2013-11-03 MED ORDER — MAGNESIUM HYDROXIDE 400 MG/5ML PO SUSP: 30.0000 mL | Freq: Every day | ORAL | Status: DC | PRN

## 2013-11-03 MED ORDER — TRAMADOL HCL 50 MG PO TABS: 50.0000 mg | ORAL_TABLET | Freq: Four times a day (QID) | ORAL | Status: DC | PRN

## 2013-11-03 NOTE — Progress Notes (Signed)
Pt d/c to [facility]. Report given to [facility].

## 2013-11-03 NOTE — Telephone Encounter (Signed)
error 

## 2013-11-03 NOTE — Progress Notes (Signed)
Clinical Social Work Department CLINICAL SOCIAL WORK PLACEMENT NOTE 11/03/2013  Patient:  Toni [name], Toni [name]  Account Number:  [id number] Admit date:  10/30/2013  Clinical Social Worker:  Werner Lean, LCSW  Date/time:  10/31/2013 01:56 PM  Clinical Social Work is seeking post-discharge placement for this patient at the following level of care:   SKILLED NURSING   (*CSW will update this form in Epic as items are completed)   10/31/2013  Patient/family provided with [place] Department of Clinical Social Work's list of facilities offering this level of care within the geographic area requested by the patient (or if unable, by the patient's family).  10/31/2013  Patient/family informed of their freedom to choose among providers that offer the needed level of care, that participate in Medicare, Medicaid or managed care program needed by the patient, have an available bed and are willing to accept the patient.    Patient/family informed of [facility]' ownership interest in [facility], as well as of the fact that they are under no obligation to receive care at this facility.  PASARR submitted to EDS on 10/31/2013 PASARR number received from EDS on 10/31/2013  FL2 transmitted to all facilities in geographic area requested by pt/family on  10/31/2013 FL2 transmitted to all facilities within larger geographic area on 10/31/2013  Patient informed that his/her managed care company has contracts with or will negotiate with  certain facilities, including the following:     Patient/family informed of bed offers received:  10/31/2013 Patient chooses bed at [facility] Physician recommends and patient chooses bed at    Patient to be transferred to [facility] on  11/03/2013 Patient to be transferred to facility by [facility]  The following physician request were entered in Epic:   Additional Comments:  Werner Lean LCSW [phone]

## 2013-11-03 NOTE — Telephone Encounter (Signed)
Please reference message from 10-31-13. Pt is currently in the hospital and she needs to have her pump refilled before the rehab facility will let her come.  She asked if this could be done by Dr. [name] today and if he could come to the hospital to do it.  Please call her at the hospital at [phone]. Thank you

## 2013-11-03 NOTE — Discharge Summary (Signed)
Toni [name], is a 68 y.o. female  DOB December 03, 1945  MRN [id number].  Admission date:  10/30/2013  Admitting Physician  Rise Patience, MD  Discharge Date:  11/03/2013   Primary MD  [name], MD  Recommendations for primary care physician for things to follow:   No weight bearing R.Leg. Follow Dr. Ninfa Linden orthopedics in a week along with her primary neurologist in a week at [facility] neurology.   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 Multiple sclerosis     has had this may 1989-[facility] reg doc  . Hypertension   . PAF (paroxysmal atrial fibrillation)     on coumadin; documented on monitor 06/2010  . Thyroid disease   . HLD (hyperlipidemia)   . Shortness of breath   . Pneumonia   . Sleep apnea   . Hyperthyroidism   . Edema     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' ablation of R GSV 2012  . Depression   . Anxiety   . Headache(784.0)     MIGRAINE   . Chronic pain   . Hx of echocardiogram 04/22/10    EF 55-60%, no valve issues  . History of stress test 06/21/10    limited exam with some degree of breast attenuatin of the apex however a component of apical ischemia is not excluded, EF 62%; cardiac cath     Past Surgical History    Procedure Laterality Date  . Cervical fusion      with correction-june 2005  . Infusion pump implantation      baclofen infusion in lower abd  . Venous ablation  12/16/10    radiofreq ablation -Dr Elisabeth Cara and [facility]  . Child births      X59  . Cardiac catheterization  06/22/10    normal coronary arteries, PAF     Discharge Condition: Stable   Follow UP  Follow-up Information   Follow up with Mcarthur Rossetti, MD. Schedule an appointment as soon as possible for a visit in 10 days.   Specialty:  Orthopedic Surgery   Contact information:   [place] [address] [phone]         Discharge Instructions  and  Discharge Medications      Discharge Orders   Future Appointments Provider Department Dept Phone   11/12/2013 11:10 AM Tommy Medal, [place] [id number]   11/17/2013 12:00 PM Kathrynn Ducking, MD [facility] [phone]   03/09/2014  9:40 AM [facility]  IMAGING [phone]   Please wear two piece clothing and wear no powder or deodorant. Please arrive 15 minutes early prior to your appointment time.   04/23/2014 3:00 PM [name], MD [facility] [phone]   Future Orders Complete By Expires   Discharge instructions  As directed    Comments:     Follow with Primary MD [name], MD in 7 days   Get CBC, CMP, checked 7 days by Primary MD and again as instructed by your Primary MD.    Activity: As tolerated with Full fall precautions use walker/cane & assistance as needed. No weight bearing R.Leg   Disposition SNF   Diet:Regular - per patient request  For Heart failure patients - Check your Weight same time everyday, if you gain over 2 pounds, or you develop in leg swelling, experience more shortness of breath or chest pain, call your Primary MD immediately. Follow Cardiac Low Salt Diet and 1.8 lit/day fluid restriction.   On  your next visit with her primary care physician please Get Medicines reviewed and adjusted.  Please request your Prim.MD to go over all Hospital Tests and Procedure/Radiological results at the follow up, please get all Hospital records sent to your Prim MD by signing hospital release before you go home.   If you experience worsening of your admission symptoms, develop shortness of breath, life threatening emergency, suicidal or homicidal thoughts you must seek medical attention immediately by calling 911 or calling your MD immediately  if symptoms less severe.  You Must read complete instructions/literature along with all the possible adverse reactions/side effects for all the Medicines you take and that have been prescribed to you. Take any new Medicines after you have completely understood and accpet all the possible adverse reactions/side effects.   Do not drive and provide baby sitting services if your were admitted for syncope or siezures until you have seen by Primary MD or a Neurologist and advised to do so again.  Do not drive when taking Pain medications.    Do not take more than prescribed Pain, Sleep and Anxiety Medications  Special Instructions: If you have smoked or chewed Tobacco  in the last 2 yrs please stop smoking, stop any regular Alcohol  and or any Recreational drug use.  Wear Seat belts while driving.   Please note  You were cared for by a hospitalist during your hospital stay. If you have any questions about your discharge medications or the care you received while you were in the hospital after you are discharged, you can call the unit and asked to speak with the hospitalist on call if the hospitalist that took care of you is not available. Once you are discharged, your primary care physician will handle any further medical issues. Please note that NO REFILLS for any discharge medications will be authorized once you are discharged, as it is imperative that you return to  your primary care physician (or establish a relationship with a primary care physician if you do not have one) for your aftercare needs so that they can reassess your need for medications and monitor your lab values.   Increase activity slowly  As directed        Medication List    STOP taking these medications       HP ACTHAR 80 UNIT/ML injectable gel  Generic drug:  corticotropin      TAKE these medications  ALPRAZolam 0.5 MG tablet  Commonly known as:  XANAX  Take 1 tablet (0.5 mg total) by mouth 2 (two) times daily as needed for anxiety.     BENEFIBER PO  Take 1 packet by mouth 2 (two) times daily.     buPROPion 300 MG 24 hr tablet  Commonly known as:  WELLBUTRIN XL  Take 300 mg by mouth daily.     calcium carbonate 600 MG Tabs tablet  Commonly known as:  OS-CAL  Take 600 mg by mouth 2 (two) times daily with a meal.     cephALEXin 250 MG capsule  Commonly known as:  KEFLEX  Take 250 mg by mouth at bedtime.     cetirizine 10 MG tablet  Commonly known as:  ZYRTEC  Take 10 mg by mouth daily.     co-enzyme Q-10 30 MG capsule  Take 30 mg by mouth daily.     Fish Oil 600 MG Caps  Take 1,200 mg by mouth daily.     furosemide 40 MG tablet  Commonly known as:  LASIX  Take 20 mg by mouth 2 (two) times daily.     gabapentin 300 MG capsule  Commonly known as:  NEURONTIN  Take 300 mg by mouth 3 (three) times daily as needed. Pain.     lactulose 10 GM/15ML solution  Commonly known as:  CHRONULAC  Take 10 g by mouth 2 (two) times daily.     levothyroxine 75 MCG tablet  Commonly known as:  SYNTHROID, LEVOTHROID  Take 75 mcg by mouth daily.     magnesium hydroxide 400 MG/5ML suspension  Commonly known as:  MILK OF MAGNESIA  Take 30 mLs by mouth daily as needed for mild constipation.     Misc. Devices Misc  by Intrathecal route continuous. Baclofen intrathecal pump. Managed by Dr [name] -see office note in [facility] from Sept 2014.     multivitamin with minerals  Tabs tablet  Take 1 tablet by mouth daily.     Nebivolol HCl 20 MG Tabs  Take 20 mg by mouth daily.     omeprazole 20 MG capsule  Commonly known as:  PRILOSEC  Take 20 mg by mouth daily.     ranitidine 150 MG tablet  Commonly known as:  ZANTAC  Take 150 mg by mouth at bedtime. At bedtime     simvastatin 20 MG tablet  Commonly known as:  ZOCOR  Take 20 mg by mouth at bedtime.     tamsulosin 0.4 MG Caps capsule  Commonly known as:  FLOMAX  Take 0.4 mg by mouth daily.     traMADol 50 MG tablet  Commonly known as:  ULTRAM  Take 1-2 tablets (50-100 mg total) by mouth every 6 (six) hours as needed for moderate pain.     warfarin 5 MG tablet  Commonly known as:  COUMADIN  Take 5 mg by mouth daily.          Diet and Activity recommendation: See Discharge Instructions above   Consults obtained - Orthopedics   Major procedures and Radiology Reports - PLEASE review detailed and final reports for all details, in brief -       Dg Ankle Complete Right  10/30/2013   CLINICAL DATA:  Fall.  Right ankle pain.  EXAM: RIGHT ANKLE - COMPLETE 3+ VIEW  COMPARISON:  None.  FINDINGS: There fractures of the medial malleolus and distal fibula. The median malleolar fracture is transverse across its base. The distal fibular fracture  is oblique extending from the pressure a lateral mid-diaphysis to the medial metaphysis at the level of the ankle joint. There may be a posterior malleolar fracture along the posterior inferior corner of the distal tibia which is somewhat more equivocal.  Ankle mortise is normally space and aligned. There is minimal fracture displacement.  Bones are diffusely demineralized. There is calcaneal spurring and ossification along the distal Achilles tendon.  The soft tissues are diffusely edematous.  IMPRESSION: 1. Fracture of the base of the medial malleolus and oblique fracture across the distal fibula. There is a possible posterior malleolar fracture. No significant  fracture displacement. Ankle joint is normally aligned.   Electronically Signed   By: Lajean Manes M.D.   On: 10/30/2013 19:35   Dg Chest Port 1 View  10/31/2013   CLINICAL DATA:  Infiltrate  EXAM: PORTABLE CHEST - 1 VIEW  COMPARISON:  05/15/2013  FINDINGS: Cardiac silhouette enlarged mildly prominent mediastinal contours, similar prior. Mild central vascular congestion. No overt edema. No focal consolidation. No pleural effusion or pneumothorax. Cervical fusion hardware. Bilateral acromioclavicular joint DJD. No acute osseous finding. Gentle rightward curvature of the thoracic spine and multilevel degenerative changes.  IMPRESSION: Prominent cardiomediastinal contours. No focal consolidation or overt edema.   Electronically Signed   By: Carlos Levering M.D.   On: 10/31/2013 00:44    Micro Results      No results found for this or any previous visit (from the past 240 hour(s)).   History of present illness and  Hospital Course:     Kindly see H&P for history of present illness and admission details, please review complete Labs, Consult reports and Test reports for all details in brief Toni [name], is a 68 y.o. female, patient with history of  generalized weakness due to multiple sclerosis who uses a walker to ambulate at baseline, chronic diastolic CHF, obstructive sleep apnea wears nasal see, morbid obesity, hypertension, proximal atrial fibrillation on Coumadin, dyslipidemia, hypothyroidism, intermittent fluid retention does her own Lasix was admitted to the hospital after a trip and fall causing closed right ankle fracture.      Right ankle fracture status post mechanical fall - patient has been placed on splint. Continue with pain relief medications. Physical therapy consult for further recommendations. Orthopedic surgeon Dr. Renard Hamper man following, may need surgery in the outpatient setting, she is nonweightbearing on the right leg, with underlying history of MS and generalized weakness  she will require placement to a rehabilitation facility for further PT.    Leukocytosis - reactionary. Patient was recently treated for pneumonia with Levaquin 2 weeks ago. Patient is also on chronic suppressive therapy for UTI on cephalexin. UA and chest x-ray are stable and patient is afebrile. Continue to monitor. Patient is presently afebrile. Leukocytosis trend is improving and now close to normal. Repeat CBC in a week.    Multiple sclerosis -chronic generalized weakness lower extremity is worse no symptoms to suggest acute exacerbation. PT evaluation. She discussed her chronic medication which is injectable Acthar was informed by her neurology office to hold it and she follows with them in the office again     History of Atrial fibrillation presently rate controlled - continue home dose B.Blocker along with Coumadin per pharmacy.   Lab Results  Component Value Date   INR 3.17* 11/03/2013   INR 3.24* 11/02/2013   INR 2.72* 11/01/2013      Hypertension - continue home dose Bysystolic.      Hyperlipidemia - continue statins.  OSA - CPAP per respiratory QHS.      Hypothyroidism. home dose Synthroid continued.      Chronic diastolic dysfunction EF [id number] on last echogram. Compensated, continue home dose Lasix which she titrates herself.      MS with generalized weakness - uses walker at baseline, will follow with primary neurologist within a week post discharge. Also has a chronic baclofen pump managed by her neurologist. She also takes injectable medication Acthar which will be given by neurologist as needed.      Today   Subjective:   Lacy Duverney today has no headache,no chest abdominal pain,no new weakness tingling or numbness, feels much better.   Objective:   Blood pressure 138/82, pulse 80, temperature 97.9 F (36.6 C), temperature source Oral, resp. rate 12, height 5\' 3"  (1.6 m), weight 122.471 kg (270 lb), SpO2 95.00%.   Intake/Output  Summary (Last 24 hours) at 11/03/13 1102 Last data filed at 11/03/13 0852  Gross per 24 hour  Intake    480 ml  Output   1600 ml  Net  -1120 ml    Exam Awake Alert, Oriented *3, No new F.N deficits, Normal affect [place].AT,PERRAL Supple Neck,No JVD, No cervical lymphadenopathy appriciated.  Symmetrical Chest wall movement, Good air movement bilaterally, CTAB RRR,No Gallops,Rubs or new Murmurs, No Parasternal Heave +ve B.Sounds, Abd Soft, Non tender, No organomegaly appriciated, No rebound -guarding or rigidity. No Cyanosis, Clubbing or edema, No new Rash or bruise R.Ankle in splint    Data Review   CBC w Diff: Lab Results  Component Value Date   WBC 10.8* 11/02/2013   WBC 6.0 06/17/2013   HGB 14.7 11/02/2013   HCT 43.1 11/02/2013   PLT 196 11/02/2013   LYMPHOPCT 15 10/30/2013   MONOPCT 8 10/30/2013   EOSPCT 1 10/30/2013   BASOPCT 0 10/30/2013    CMP: Lab Results  Component Value Date   NA 139 10/31/2013   NA 140 06/17/2013   K 4.0 11/01/2013   CL 97 10/31/2013   CO2 28 10/31/2013   BUN 16 10/31/2013   BUN 13 06/17/2013   CREATININE 0.65 10/31/2013   PROT 5.9* 06/17/2013   PROT 6.7 05/15/2013   ALBUMIN 3.7 05/15/2013   BILITOT 0.3 06/17/2013   ALKPHOS 60 06/17/2013   AST 18 06/17/2013   ALT 24 06/17/2013  .   Total Time in preparing paper work, data evaluation and todays exam - 35 minutes  Thurnell Lose M.D on 11/03/2013 at 11:02 AM  [place]  [phone]

## 2013-11-03 NOTE — Telephone Encounter (Signed)
Called patient and patient stated that she needed to have her pump refill today before she gets discharged today, because where she is going to [facility] will not pay for her to come 11/17/13 for her appt to get her pump refilled. I spoke with the patient's social worker at the hospital, [place] stated that they could hold the patient there until 4:00 pm until Dr. [name] gets there. I spoke with Dr. [name] concerning this matter and Dr. [name] stated that he could not refill the patient's pump today. I informed the patient's social worker Roselyn Reef and she verbalized understanding.

## 2013-11-03 NOTE — Progress Notes (Signed)
Toni [name] for Warfarin Indication: atrial fibrillation  Allergies  Allergen Reactions  . Hydrocodone     Hydrocodone causes vomiting  . Oxycodone     Oral oxycodone causes vomiting  . Chlorhexidine Gluconate     Sores at site  . Zoloft [Sertraline] Hives, Swelling and Rash    Patient Measurements: Height: 5\' 3"  (160 cm) Weight: 270 lb (122.471 kg) IBW/kg (Calculated) : 52.4   Vital Signs: Temp: 97.9 F (36.6 C) (03/16 0545) Temp src: Oral (03/16 0545) BP: 138/82 mmHg (03/16 0545) Pulse Rate: 80 (03/16 0545)  Labs:  Recent Labs  11/01/13 0520 11/02/13 0543 11/03/13 0425  HGB  --  14.7  --   HCT  --  43.1  --   PLT  --  196  --   LABPROT 27.9* 31.9* 31.4*  INR 2.72* 3.24* 3.17*    Estimated Creatinine Clearance: 87.8 ml/min (by C-G formula based on Cr of 0.65).   Medical History: Past Medical History  Diagnosis Date  . Multiple sclerosis     has had this may 1989-[facility]me[facility] reg doc  . Hypertension   . PAF (paroxysmal atrial fibrillation)     on coumadin; documented on monitor 06/2010  . Thyroid disease   . HLD (hyperlipidemia)   . Shortness of breath   . Pneumonia   . Sleep apnea   . Hyperthyroidism   . Edema     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' ablation of R GSV 2012  . Depression   . Anxiety   . Headache(784.0)     MIGRAINE   . Chronic pain   . Hx of echocardiogram 04/22/10    EF 55-60%, no valve issues  . History of stress test 06/21/10    limited exam with some degree of breast attenuatin of the apex however a component of apical ischemia is not excluded, EF 62%; cardiac cath     Medications:  Scheduled:  . buPROPion  300 mg Oral Daily  . calcium carbonate  1,250 mg Oral BID WC  . cephALEXin  250 mg Oral QHS  . docusate sodium  100 mg Oral BID  . famotidine  20 mg Oral BID  . furosemide  40 mg Oral 2 times per day  . lactulose  10 g Oral BID  . levothyroxine  75 mcg Oral Daily   . loratadine  10 mg Oral Daily  . multivitamin with minerals  1 tablet Oral Daily  . nebivolol  20 mg Oral Daily  . omega-3 acid ethyl esters  1 g Oral Daily  . pantoprazole  40 mg Oral Daily  . simvastatin  20 mg Oral QHS  . sodium chloride  3 mL Intravenous Q12H  . tamsulosin  0.4 mg Oral Daily  . Warfarin - Pharmacist Dosing Inpatient   Does not apply q1800   Infusions:    Assessment: 68 yo F with hx of multiple sclerosis, on chronic warfarin for A-fib, admitted with R ankle fracture after a fall. Orders were received to continue warfarin with pharmacy dosing assistance while inpatient.  Patient confirms PTA warfarin dosage of 5 mg daily and says her INR is usually stable on that dosage.  INR therapeutic (2.29) on admission.  3/14: INR slightly supratherapeutic but improving.   Inpatient warfarin doses administered: 3/13-3/15:  5mg , 5mg , 5mg , 2.5mg . On cardiac diet but reportedly not eating much. No bleeding reported in charting notes.  Goal of Therapy:  INR 2-3  Plan:   Warfarin at reduced dosage tonight (2 mg x1 at 1800).  Daily PT/INR while inpatient  Clayburn Pert, PharmD, BCPS Pager: [phone] 11/03/2013  7:36 AM

## 2013-11-04 ENCOUNTER — Encounter: Payer: Self-pay | Admitting: Internal Medicine

## 2013-11-04 DIAGNOSIS — S82899A Other fracture of unspecified lower leg, initial encounter for closed fracture: Secondary | ICD-10-CM | POA: Diagnosis not present

## 2013-11-04 DIAGNOSIS — D72829 Elevated white blood cell count, unspecified: Secondary | ICD-10-CM | POA: Diagnosis not present

## 2013-11-04 DIAGNOSIS — G35 Multiple sclerosis: Secondary | ICD-10-CM | POA: Diagnosis not present

## 2013-11-04 DIAGNOSIS — I4891 Unspecified atrial fibrillation: Secondary | ICD-10-CM | POA: Diagnosis not present

## 2013-11-06 DIAGNOSIS — R609 Edema, unspecified: Secondary | ICD-10-CM | POA: Diagnosis not present

## 2013-11-06 DIAGNOSIS — I4891 Unspecified atrial fibrillation: Secondary | ICD-10-CM | POA: Diagnosis not present

## 2013-11-07 ENCOUNTER — Encounter: Payer: Self-pay | Admitting: Neurology

## 2013-11-07 NOTE — Telephone Encounter (Signed)
The order was faxed on 10/24/2013 for the wheelchair.

## 2013-11-10 DIAGNOSIS — R609 Edema, unspecified: Secondary | ICD-10-CM | POA: Diagnosis not present

## 2013-11-11 ENCOUNTER — Encounter: Payer: Self-pay | Admitting: Neurology

## 2013-11-12 ENCOUNTER — Ambulatory Visit: Payer: Medicare Other | Admitting: Pharmacist Clinician (PhC)/ Clinical Pharmacy Specialist

## 2013-11-12 ENCOUNTER — Telehealth: Payer: Self-pay | Admitting: Neurology

## 2013-11-12 DIAGNOSIS — S82843A Displaced bimalleolar fracture of unspecified lower leg, initial encounter for closed fracture: Secondary | ICD-10-CM | POA: Diagnosis not present

## 2013-11-12 NOTE — Telephone Encounter (Signed)
Pt calling stating that she broke her leg and can not put any weight on it. Pt was wanting to know if she can be examined in the wheel chair, instead of having to get on the table. Pt states that if she has to get on the table that she would have to have a ambulance bring her to assist her. Pt appt is on Monday. Please advise

## 2013-11-12 NOTE — Telephone Encounter (Signed)
I called patient. The pump refill can be done from a sitting position, but the chair has to be able to recline. If not, the patient should be on a stretcher. The patient will likely come by ambulance.

## 2013-11-12 NOTE — Telephone Encounter (Signed)
She has broken her ankle and can not put in any weight on it. As a result she is in a rehab center ([facility]) she is trying to arrange transportation and needs to know if the refill can be done with her in a wheel chair or if she needs to set up  amblance services to be able to assist her with getting up on the bed. Her appointment is on Monday so she needs an answer as soon as possible.

## 2013-11-14 ENCOUNTER — Telehealth: Payer: Self-pay | Admitting: *Deleted

## 2013-11-14 DIAGNOSIS — G4733 Obstructive sleep apnea (adult) (pediatric): Secondary | ICD-10-CM

## 2013-11-14 NOTE — Telephone Encounter (Signed)
Message copied by Cathi Roan on Fri Nov 14, 2013  7:45 AM ------      Message from: Dory Horn      Created: Tue Nov 11, 2013  3:04 PM      Regarding: Need order       Hi Toni [name],      Dr. Brett Fairy was able to look over the pt's download which showed an elevated AHI of 12.7 and consisting of mostly central apneas.  She is requesting a pressure decrease on Auto CPAP to 8/13 cm as her current pressure may be too high and causing the centrals.  Could you please add the order?  Thanks! ------

## 2013-11-17 ENCOUNTER — Ambulatory Visit (INDEPENDENT_AMBULATORY_CARE_PROVIDER_SITE_OTHER): Payer: Medicare Other | Admitting: Neurology

## 2013-11-17 ENCOUNTER — Encounter: Payer: Self-pay | Admitting: Neurology

## 2013-11-17 VITALS — BP 104/70 | HR 68

## 2013-11-17 DIAGNOSIS — G35 Multiple sclerosis: Secondary | ICD-10-CM | POA: Diagnosis not present

## 2013-11-17 DIAGNOSIS — S8990XA Unspecified injury of unspecified lower leg, initial encounter: Secondary | ICD-10-CM | POA: Diagnosis not present

## 2013-11-17 DIAGNOSIS — S82899A Other fracture of unspecified lower leg, initial encounter for closed fracture: Secondary | ICD-10-CM | POA: Diagnosis not present

## 2013-11-17 NOTE — Progress Notes (Signed)
The patient returned today for a baclofen pump refill. Please refer to the procedure note.

## 2013-11-17 NOTE — Procedures (Signed)
     History:  Toni [name] is a 68 year old patient with a history of multiple sclerosis. The patient has a baclofen pump in place, and she returns for a baclofen pump refill. The patient has described some increase in spasticity associated with a recent ankle fracture. Otherwise, the patient is doing well.  Baclofen pump refill note  The baclofen pump site was cleaned with Betadine solution. A 21-gauge needle was inserted into the pump port site. Approximately 5 cc of residual baclofen was removed. 40 cc of replacement baclofen was placed into the pump at 2000 mcg/cc concentration.  The pump was reprogrammed for the following settings: Basal rate is 36 mcg per hour throughout the day with exception that from 1 AM until 5 AM, the basal rate goes down to 25 mcg per hour, for a total of 124.9 mcg delivered during this period of time. The total daily dose is 809.1 mcg per day.  The alarm volume is set at 1.5. The next alarm date is February 20, 2014.  The patient tolerated the procedure well. There were no complications of the above procedure.

## 2013-11-19 ENCOUNTER — Telehealth: Payer: Self-pay | Admitting: Neurology

## 2013-11-19 DIAGNOSIS — D72829 Elevated white blood cell count, unspecified: Secondary | ICD-10-CM | POA: Diagnosis not present

## 2013-11-19 DIAGNOSIS — R52 Pain, unspecified: Secondary | ICD-10-CM | POA: Diagnosis not present

## 2013-11-19 DIAGNOSIS — M6281 Muscle weakness (generalized): Secondary | ICD-10-CM | POA: Diagnosis not present

## 2013-11-19 DIAGNOSIS — R209 Unspecified disturbances of skin sensation: Secondary | ICD-10-CM | POA: Diagnosis not present

## 2013-11-19 DIAGNOSIS — S82409A Unspecified fracture of shaft of unspecified fibula, initial encounter for closed fracture: Secondary | ICD-10-CM | POA: Diagnosis not present

## 2013-11-19 DIAGNOSIS — M25579 Pain in unspecified ankle and joints of unspecified foot: Secondary | ICD-10-CM | POA: Diagnosis not present

## 2013-11-19 DIAGNOSIS — Z5189 Encounter for other specified aftercare: Secondary | ICD-10-CM | POA: Diagnosis not present

## 2013-11-19 DIAGNOSIS — M25473 Effusion, unspecified ankle: Secondary | ICD-10-CM | POA: Diagnosis not present

## 2013-11-19 DIAGNOSIS — S82843A Displaced bimalleolar fracture of unspecified lower leg, initial encounter for closed fracture: Secondary | ICD-10-CM | POA: Diagnosis not present

## 2013-11-19 DIAGNOSIS — I4891 Unspecified atrial fibrillation: Secondary | ICD-10-CM | POA: Diagnosis not present

## 2013-11-19 DIAGNOSIS — R609 Edema, unspecified: Secondary | ICD-10-CM | POA: Diagnosis not present

## 2013-11-19 DIAGNOSIS — G35 Multiple sclerosis: Secondary | ICD-10-CM | POA: Diagnosis not present

## 2013-11-19 DIAGNOSIS — S82853A Displaced trimalleolar fracture of unspecified lower leg, initial encounter for closed fracture: Secondary | ICD-10-CM | POA: Diagnosis not present

## 2013-11-19 DIAGNOSIS — I10 Essential (primary) hypertension: Secondary | ICD-10-CM | POA: Diagnosis not present

## 2013-11-19 DIAGNOSIS — G609 Hereditary and idiopathic neuropathy, unspecified: Secondary | ICD-10-CM | POA: Diagnosis not present

## 2013-11-19 DIAGNOSIS — S8290XD Unspecified fracture of unspecified lower leg, subsequent encounter for closed fracture with routine healing: Secondary | ICD-10-CM | POA: Diagnosis not present

## 2013-11-19 DIAGNOSIS — M79609 Pain in unspecified limb: Secondary | ICD-10-CM | POA: Diagnosis not present

## 2013-11-19 DIAGNOSIS — G4730 Sleep apnea, unspecified: Secondary | ICD-10-CM | POA: Diagnosis not present

## 2013-11-19 NOTE — Telephone Encounter (Signed)
Spoke with patient and informed that we will need a copy of the summons/20 Fee for the letter, she said that she had to talk with husband to make sure that is what he wants to do, will call back

## 2013-11-19 NOTE — Telephone Encounter (Signed)
Pt called states spouse [name] recd jury duty summons for 12/23/13. Pt wants to know if Dr. [name] or Dr. Brett Fairy can write a letter stating that [name] is the sole caretaker of pt that has MS. Pt states they have to have this back to the courthouse within 10 days of the summons. Please call pt at [phone] this is her room # at [facility]. Thanks

## 2013-11-20 DIAGNOSIS — I4891 Unspecified atrial fibrillation: Secondary | ICD-10-CM | POA: Diagnosis not present

## 2013-11-20 DIAGNOSIS — R209 Unspecified disturbances of skin sensation: Secondary | ICD-10-CM | POA: Diagnosis not present

## 2013-11-26 DIAGNOSIS — R609 Edema, unspecified: Secondary | ICD-10-CM | POA: Diagnosis not present

## 2013-11-27 ENCOUNTER — Emergency Department (HOSPITAL_COMMUNITY): Payer: Medicare Other

## 2013-11-27 ENCOUNTER — Emergency Department (HOSPITAL_COMMUNITY)
Admission: EM | Admit: 2013-11-27 | Discharge: 2013-11-27 | Disposition: A | Discharge status: Home / Self Care | Payer: Medicare Other | Attending: Emergency Medicine | Admitting: Emergency Medicine

## 2013-11-27 DIAGNOSIS — F3289 Other specified depressive episodes: Secondary | ICD-10-CM | POA: Insufficient documentation

## 2013-11-27 DIAGNOSIS — I10 Essential (primary) hypertension: Secondary | ICD-10-CM | POA: Insufficient documentation

## 2013-11-27 DIAGNOSIS — G8929 Other chronic pain: Secondary | ICD-10-CM | POA: Insufficient documentation

## 2013-11-27 DIAGNOSIS — S82853A Displaced trimalleolar fracture of unspecified lower leg, initial encounter for closed fracture: Secondary | ICD-10-CM | POA: Diagnosis not present

## 2013-11-27 DIAGNOSIS — I4891 Unspecified atrial fibrillation: Secondary | ICD-10-CM | POA: Insufficient documentation

## 2013-11-27 DIAGNOSIS — E669 Obesity, unspecified: Secondary | ICD-10-CM | POA: Insufficient documentation

## 2013-11-27 DIAGNOSIS — M79604 Pain in right leg: Secondary | ICD-10-CM

## 2013-11-27 DIAGNOSIS — Z9889 Other specified postprocedural states: Secondary | ICD-10-CM | POA: Insufficient documentation

## 2013-11-27 DIAGNOSIS — E785 Hyperlipidemia, unspecified: Secondary | ICD-10-CM | POA: Insufficient documentation

## 2013-11-27 DIAGNOSIS — M79609 Pain in unspecified limb: Secondary | ICD-10-CM | POA: Diagnosis not present

## 2013-11-27 DIAGNOSIS — F329 Major depressive disorder, single episode, unspecified: Secondary | ICD-10-CM | POA: Insufficient documentation

## 2013-11-27 DIAGNOSIS — E0590 Thyrotoxicosis, unspecified without thyrotoxic crisis or storm: Secondary | ICD-10-CM | POA: Insufficient documentation

## 2013-11-27 DIAGNOSIS — Z792 Long term (current) use of antibiotics: Secondary | ICD-10-CM | POA: Insufficient documentation

## 2013-11-27 DIAGNOSIS — Z7901 Long term (current) use of anticoagulants: Secondary | ICD-10-CM | POA: Insufficient documentation

## 2013-11-27 DIAGNOSIS — Z79899 Other long term (current) drug therapy: Secondary | ICD-10-CM | POA: Insufficient documentation

## 2013-11-27 DIAGNOSIS — Z87891 Personal history of nicotine dependence: Secondary | ICD-10-CM | POA: Insufficient documentation

## 2013-11-27 DIAGNOSIS — F411 Generalized anxiety disorder: Secondary | ICD-10-CM | POA: Insufficient documentation

## 2013-11-27 DIAGNOSIS — Z8701 Personal history of pneumonia (recurrent): Secondary | ICD-10-CM | POA: Insufficient documentation

## 2013-11-27 DIAGNOSIS — G4730 Sleep apnea, unspecified: Secondary | ICD-10-CM | POA: Insufficient documentation

## 2013-11-27 DIAGNOSIS — IMO0001 Reserved for inherently not codable concepts without codable children: Secondary | ICD-10-CM | POA: Insufficient documentation

## 2013-11-27 DIAGNOSIS — G43909 Migraine, unspecified, not intractable, without status migrainosus: Secondary | ICD-10-CM | POA: Insufficient documentation

## 2013-11-27 LAB — PROTIME-INR
INR: 2.4 — ABNORMAL HIGH (ref 0.00–1.49)
Prothrombin Time: 25.4 seconds — ABNORMAL HIGH (ref 11.6–15.2)

## 2013-11-27 MED ORDER — HYDROMORPHONE HCL PF 1 MG/ML IJ SOLN
1.0000 mg | Freq: Once | INTRAMUSCULAR | Status: AC
  Administered 2013-11-27: 1 mg via INTRAMUSCULAR
  Filled 2013-11-27: qty 1

## 2013-11-27 MED ORDER — ONDANSETRON 4 MG PO TBDP
4.0000 mg | ORAL_TABLET | Freq: Once | ORAL | Status: AC
  Administered 2013-11-27: 4 mg via ORAL
  Filled 2013-11-27: qty 1

## 2013-11-27 NOTE — ED Notes (Signed)
Bed: [facility] Expected date:  Expected time:  Means of arrival:  Comments: ems- 67yl R leg pain from rehab center

## 2013-11-27 NOTE — ED Provider Notes (Signed)
CSN: [id number]     Arrival date & time 11/27/13  1622 History   First MD Initiated Contact with Patient 11/27/13 1631     Chief Complaint  Patient presents with  . Leg Pain     (Consider location/radiation/quality/duration/timing/severity/associated sxs/prior Treatment) HPI  67yF with R leg pain. Pt is s/p casting of R ankle fx weeks ago. Was getting rehab and in midst of this had acute sensation of swelling in R mid to distal shin/ankle. Denies hitting it/falling/etc. New/different pain then she has been having. No numbness or tingling. No fever or chills. On coumadin for afib. Denies hx of blood clot.   Past Medical History  Diagnosis Date  . Multiple sclerosis     has had this may 1989-[facility] reg doc  . Hypertension   . PAF (paroxysmal atrial fibrillation)     on coumadin; documented on monitor 06/2010  . Thyroid disease   . HLD (hyperlipidemia)   . Shortness of breath   . Pneumonia   . Sleep apnea   . Hyperthyroidism   . Edema     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' ablation of R GSV 2012  . Depression   . Anxiety   . Headache(784.0)     MIGRAINE   . Chronic pain   . Hx of echocardiogram 04/22/10    EF 55-60%, no valve issues  . History of stress test 06/21/10    limited exam with some degree of breast attenuatin of the apex however a component of apical ischemia is not excluded, EF 62%; cardiac cath    Past Surgical History  Procedure Laterality Date  . Cervical fusion      with correction-june 2005  . Infusion pump implantation      baclofen infusion in lower abd  . Venous ablation  12/16/10    radiofreq ablation -Dr Elisabeth Cara and [facility]  . Child births      X82  . Cardiac catheterization  06/22/10    normal coronary arteries, PAF   Family History  Problem Relation Age of Onset  . Coronary artery disease Father     at age 50  . Coronary artery disease Maternal Grandmother   . Depression Maternal Grandmother   . Cancer Paternal Grandmother    . Depression Mother    History  Substance Use Topics  . Smoking status: Former Smoker    Quit date: 08/21/1976  . Smokeless tobacco: Never Used     Comment: QUIT 1978  . Alcohol Use: Yes     Comment: SOCIALLY   OB History   Grav Para Term Preterm Abortions TAB SAB Ect Mult Living                 Review of Systems  All systems reviewed and negative, other than as noted in HPI.   Allergies  Hydrocodone; Oxycodone; Chlorhexidine gluconate; and Zoloft  Home Medications   Current Outpatient Rx  Name  Route  Sig  Dispense  Refill  . ALPRAZolam (XANAX) 0.5 MG tablet   Oral   Take 1 tablet (0.5 mg total) by mouth 2 (two) times daily as needed for anxiety.   180 tablet   0     Pharmacy Fax [phone]   . baclofen (LIORESAL) 20 MG tablet   Oral   Take 20 mg by mouth 3 (three) times daily as needed for muscle spasms.         Marland Kitchen buPROPion (WELLBUTRIN XL) 300 MG 24  hr tablet   Oral   Take 300 mg by mouth daily.         . calcium carbonate (OS-CAL) 600 MG TABS tablet   Oral   Take 600 mg by mouth 2 (two) times daily with a meal.         . cephALEXin (KEFLEX) 250 MG capsule   Oral   Take 250 mg by mouth at bedtime.         . cetirizine (ZYRTEC) 10 MG tablet   Oral   Take 10 mg by mouth daily.         Marland Kitchen co-enzyme Q-10 30 MG capsule   Oral   Take 30 mg by mouth daily.         Marland Kitchen docusate sodium (COLACE) 100 MG capsule   Oral   Take 100 mg by mouth 2 (two) times daily.         . furosemide (LASIX) 40 MG tablet   Oral   Take 20 mg by mouth 2 (two) times daily.         Marland Kitchen gabapentin (NEURONTIN) 300 MG capsule   Oral   Take 300 mg by mouth 3 (three) times daily as needed. Pain.         . lactulose (CHRONULAC) 10 GM/15ML solution   Oral   Take 10 g by mouth 2 (two) times daily.         Marland Kitchen levothyroxine (SYNTHROID, LEVOTHROID) 75 MCG tablet   Oral   Take 75 mcg by mouth daily.         . magnesium hydroxide (MILK OF MAGNESIA) 400 MG/5ML  suspension   Oral   Take 30 mLs by mouth daily as needed for mild constipation.   360 mL   0   . Multiple Vitamin (MULITIVITAMIN WITH MINERALS) TABS   Oral   Take 1 tablet by mouth daily.         . Nebivolol HCl 20 MG TABS   Oral   Take 20 mg by mouth daily.          . Omega-3 Fatty Acids (FISH OIL) 600 MG CAPS   Oral   Take 1,200 mg by mouth daily.         Marland Kitchen omeprazole (PRILOSEC) 20 MG capsule   Oral   Take 20 mg by mouth daily.          . ranitidine (ZANTAC) 150 MG tablet   Oral   Take 150 mg by mouth at bedtime. At bedtime         . simvastatin (ZOCOR) 20 MG tablet   Oral   Take 20 mg by mouth at bedtime.          . Tamsulosin HCl (FLOMAX) 0.4 MG CAPS   Oral   Take 0.4 mg by mouth daily.         Marland Kitchen warfarin (COUMADIN) 5 MG tablet   Oral   Take 5 mg by mouth daily.         . Wheat Dextrin (BENEFIBER PO)   Oral   Take 1 packet by mouth 2 (two) times daily.          BP 138/73  Pulse 76  Temp(Src) 98.5 F (36.9 C) (Oral)  Resp 16  SpO2 96% Physical Exam  Nursing note and vitals reviewed. Constitutional: She appears well-developed and well-nourished. No distress.  Laying in bed. NAd. Obese.   HENT:  Head: Normocephalic and atraumatic.  Eyes: Conjunctivae are normal.  Right eye exhibits no discharge. Left eye exhibits no discharge.  Neck: Neck supple.  Cardiovascular: Normal rate, regular rhythm and normal heart sounds.  Exam reveals no gallop and no friction rub.   No murmur heard. Pulmonary/Chest: Effort normal and breath sounds normal. No respiratory distress.  Abdominal: Soft. She exhibits no distension. There is no tenderness.  Musculoskeletal: She exhibits no edema and no tenderness.  R short leg cast. Toes warm, good color and brisk cap refill. Sensation intact to light touch. Easily wiggles toes. Easily able to get fingers under cast both proximally and distally.   Neurological: She is alert.  Skin: Skin is warm and dry.   Psychiatric: She has a normal mood and affect. Her behavior is normal. Thought content normal.    ED Course  Procedures (including critical care time) Labs Review Labs Reviewed  PROTIME-INR - Abnormal; Notable for the following:    Prothrombin Time 25.4 (*)    INR 2.40 (*)    All other components within normal limits   Imaging Review Dg Ankle Complete Right  11/27/2013   CLINICAL DATA:  Pain.  Fracture.  EXAM: RIGHT ANKLE - COMPLETE 3+ VIEW  COMPARISON:  DG ANKLE COMPLETE*R* dated 10/30/2013  FINDINGS: Trimalleolar fracture is again noted. Mild angulation deformity noted of the fibular fracture. The patient is in a cast.  IMPRESSION: Trimalleolar fracture with mild displacement and angulation of the fibular fracture component. Patient is casted.   Electronically Signed   By: Marcello Moores  Register   On: 11/27/2013 17:29     EKG Interpretation None      MDM   Final diagnoses:  Right leg pain    67yF with new pain in R ankle. Denies new trauma. Case well fitting on exam. Easily able to get fingers underneath on both proximal and distal ends. Toes normal in color with brisk cap refill. Able to freely wiggle toes. She denies numbness or tingling. Consider DVT with orthopedic injury. RLE proximal to cast is symmetric as compared to L and INR 2.4. Pt very concerned about alignment. Tried reassuring her that without new trauma that there should not be any significant movement. Ultimately did XR which appears to be stable from prior films. Pain improved. Has upcoming ortho FU in a couple weeks. Return precautions sooner were discussed.    Virgel Manifold, MD 12/03/13 [phone]

## 2013-11-27 NOTE — ED Notes (Signed)
Per ems pt broke right foot 1 month ago, has been in rehab facility, working on strengthening left leg, good leg, pt hx of MS. Pt denies trauma or injury to injured leg. Pt reports right leg pain started today and progressively getting worse, pt reports it feels like "cast is going to pop, and really tight on the inside". Pain 8/10.

## 2013-11-27 NOTE — ED Notes (Signed)
MD at bedside. 

## 2013-12-08 DIAGNOSIS — G609 Hereditary and idiopathic neuropathy, unspecified: Secondary | ICD-10-CM | POA: Diagnosis not present

## 2013-12-10 DIAGNOSIS — S82843A Displaced bimalleolar fracture of unspecified lower leg, initial encounter for closed fracture: Secondary | ICD-10-CM | POA: Diagnosis not present

## 2013-12-14 DIAGNOSIS — M79609 Pain in unspecified limb: Secondary | ICD-10-CM | POA: Diagnosis not present

## 2013-12-21 DIAGNOSIS — I4891 Unspecified atrial fibrillation: Secondary | ICD-10-CM | POA: Diagnosis not present

## 2013-12-21 DIAGNOSIS — R609 Edema, unspecified: Secondary | ICD-10-CM | POA: Diagnosis not present

## 2013-12-25 DIAGNOSIS — R609 Edema, unspecified: Secondary | ICD-10-CM | POA: Diagnosis not present

## 2014-01-07 DIAGNOSIS — S82843A Displaced bimalleolar fracture of unspecified lower leg, initial encounter for closed fracture: Secondary | ICD-10-CM | POA: Diagnosis not present

## 2014-01-07 DIAGNOSIS — M546 Pain in thoracic spine: Secondary | ICD-10-CM | POA: Diagnosis not present

## 2014-01-12 DIAGNOSIS — R609 Edema, unspecified: Secondary | ICD-10-CM | POA: Diagnosis not present

## 2014-01-16 DIAGNOSIS — R609 Edema, unspecified: Secondary | ICD-10-CM | POA: Diagnosis not present

## 2014-01-19 DIAGNOSIS — R339 Retention of urine, unspecified: Secondary | ICD-10-CM | POA: Diagnosis not present

## 2014-01-19 DIAGNOSIS — F3289 Other specified depressive episodes: Secondary | ICD-10-CM | POA: Diagnosis not present

## 2014-01-19 DIAGNOSIS — K219 Gastro-esophageal reflux disease without esophagitis: Secondary | ICD-10-CM | POA: Diagnosis not present

## 2014-01-19 DIAGNOSIS — F329 Major depressive disorder, single episode, unspecified: Secondary | ICD-10-CM | POA: Diagnosis not present

## 2014-01-19 DIAGNOSIS — D72829 Elevated white blood cell count, unspecified: Secondary | ICD-10-CM | POA: Diagnosis not present

## 2014-01-19 DIAGNOSIS — K5900 Constipation, unspecified: Secondary | ICD-10-CM | POA: Diagnosis not present

## 2014-01-19 DIAGNOSIS — R252 Cramp and spasm: Secondary | ICD-10-CM | POA: Diagnosis not present

## 2014-01-19 DIAGNOSIS — R52 Pain, unspecified: Secondary | ICD-10-CM | POA: Diagnosis not present

## 2014-01-19 DIAGNOSIS — I10 Essential (primary) hypertension: Secondary | ICD-10-CM | POA: Diagnosis not present

## 2014-01-19 DIAGNOSIS — E785 Hyperlipidemia, unspecified: Secondary | ICD-10-CM | POA: Diagnosis not present

## 2014-01-19 DIAGNOSIS — M6281 Muscle weakness (generalized): Secondary | ICD-10-CM | POA: Diagnosis not present

## 2014-01-19 DIAGNOSIS — G35 Multiple sclerosis: Secondary | ICD-10-CM | POA: Diagnosis not present

## 2014-01-19 DIAGNOSIS — G4730 Sleep apnea, unspecified: Secondary | ICD-10-CM | POA: Diagnosis not present

## 2014-01-19 DIAGNOSIS — G35D Multiple sclerosis, unspecified: Secondary | ICD-10-CM | POA: Diagnosis not present

## 2014-01-19 DIAGNOSIS — F411 Generalized anxiety disorder: Secondary | ICD-10-CM | POA: Diagnosis not present

## 2014-01-19 DIAGNOSIS — S8290XD Unspecified fracture of unspecified lower leg, subsequent encounter for closed fracture with routine healing: Secondary | ICD-10-CM | POA: Diagnosis not present

## 2014-01-19 DIAGNOSIS — E039 Hypothyroidism, unspecified: Secondary | ICD-10-CM | POA: Diagnosis not present

## 2014-01-19 DIAGNOSIS — T367X1A Poisoning by antifungal antibiotics, systemically used, accidental (unintentional), initial encounter: Secondary | ICD-10-CM | POA: Diagnosis not present

## 2014-01-19 DIAGNOSIS — I4891 Unspecified atrial fibrillation: Secondary | ICD-10-CM | POA: Diagnosis not present

## 2014-01-19 DIAGNOSIS — Z5189 Encounter for other specified aftercare: Secondary | ICD-10-CM | POA: Diagnosis not present

## 2014-01-23 ENCOUNTER — Telehealth: Payer: Self-pay | Admitting: Neurology

## 2014-01-23 NOTE — Telephone Encounter (Signed)
Patient stated Insurance authorization has expired for [facility] medication.  Requesting renewal.  Please call and advise

## 2014-01-23 NOTE — Telephone Encounter (Signed)
Insurance has been contacted.  Pending response.  They will notify both [name] and the patient of the outcome.

## 2014-01-24 DIAGNOSIS — I4891 Unspecified atrial fibrillation: Secondary | ICD-10-CM | POA: Diagnosis not present

## 2014-01-24 DIAGNOSIS — IMO0001 Reserved for inherently not codable concepts without codable children: Secondary | ICD-10-CM | POA: Diagnosis not present

## 2014-01-24 DIAGNOSIS — G8929 Other chronic pain: Secondary | ICD-10-CM | POA: Diagnosis not present

## 2014-01-24 DIAGNOSIS — I10 Essential (primary) hypertension: Secondary | ICD-10-CM | POA: Diagnosis not present

## 2014-01-24 DIAGNOSIS — Z7901 Long term (current) use of anticoagulants: Secondary | ICD-10-CM | POA: Diagnosis not present

## 2014-01-24 DIAGNOSIS — Z5189 Encounter for other specified aftercare: Secondary | ICD-10-CM | POA: Diagnosis not present

## 2014-01-24 DIAGNOSIS — G35 Multiple sclerosis: Secondary | ICD-10-CM | POA: Diagnosis not present

## 2014-01-26 ENCOUNTER — Telehealth: Payer: Self-pay | Admitting: *Deleted

## 2014-01-26 ENCOUNTER — Telehealth: Payer: Self-pay | Admitting: Neurology

## 2014-01-26 DIAGNOSIS — G8929 Other chronic pain: Secondary | ICD-10-CM | POA: Diagnosis not present

## 2014-01-26 DIAGNOSIS — Z5189 Encounter for other specified aftercare: Secondary | ICD-10-CM | POA: Diagnosis not present

## 2014-01-26 DIAGNOSIS — I4891 Unspecified atrial fibrillation: Secondary | ICD-10-CM | POA: Diagnosis not present

## 2014-01-26 DIAGNOSIS — G35 Multiple sclerosis: Secondary | ICD-10-CM | POA: Diagnosis not present

## 2014-01-26 DIAGNOSIS — IMO0001 Reserved for inherently not codable concepts without codable children: Secondary | ICD-10-CM | POA: Diagnosis not present

## 2014-01-26 DIAGNOSIS — I10 Essential (primary) hypertension: Secondary | ICD-10-CM | POA: Diagnosis not present

## 2014-01-26 NOTE — Telephone Encounter (Signed)
Patient requesting appointment for Baclofen pump refill.

## 2014-01-26 NOTE — Telephone Encounter (Signed)
I think this is a case for Dr [name]. CD

## 2014-01-26 NOTE — Telephone Encounter (Signed)
I called patient. She has a nerve conduction study and EMG set up for June 22. She wants a study done with me, and it is okay for this to occur.

## 2014-01-26 NOTE — Telephone Encounter (Signed)
Spoke with patient and she saw Dr Felipa Eth in [place] at [facility] referred her to our office for NCV/EMG for her hands(numbness and pain).would like to know Dr Dohmeier's opinion on having this done. If ok , would like to have it done by Dr [name].

## 2014-01-27 DIAGNOSIS — Z5189 Encounter for other specified aftercare: Secondary | ICD-10-CM | POA: Diagnosis not present

## 2014-01-27 DIAGNOSIS — I10 Essential (primary) hypertension: Secondary | ICD-10-CM | POA: Diagnosis not present

## 2014-01-27 DIAGNOSIS — G35 Multiple sclerosis: Secondary | ICD-10-CM | POA: Diagnosis not present

## 2014-01-27 DIAGNOSIS — IMO0001 Reserved for inherently not codable concepts without codable children: Secondary | ICD-10-CM | POA: Diagnosis not present

## 2014-01-27 DIAGNOSIS — G8929 Other chronic pain: Secondary | ICD-10-CM | POA: Diagnosis not present

## 2014-01-27 DIAGNOSIS — I4891 Unspecified atrial fibrillation: Secondary | ICD-10-CM | POA: Diagnosis not present

## 2014-01-27 NOTE — Telephone Encounter (Signed)
Appt on 6/22 for EMG with Dr. Tish Frederickson has been cancelled. Patient requested procedure be done by Dr.Willis whom she is already established with. Appointment has been rescheduled to 6/30 with Dr. [name] 1:00 Toni [name] and 1:30 [name]. Asked that patient call office back if she is not able to make this appointment to reschedule.

## 2014-01-28 DIAGNOSIS — I4891 Unspecified atrial fibrillation: Secondary | ICD-10-CM | POA: Diagnosis not present

## 2014-01-28 DIAGNOSIS — I10 Essential (primary) hypertension: Secondary | ICD-10-CM | POA: Diagnosis not present

## 2014-01-28 DIAGNOSIS — Z5189 Encounter for other specified aftercare: Secondary | ICD-10-CM | POA: Diagnosis not present

## 2014-01-28 DIAGNOSIS — G35 Multiple sclerosis: Secondary | ICD-10-CM | POA: Diagnosis not present

## 2014-01-28 DIAGNOSIS — S82843A Displaced bimalleolar fracture of unspecified lower leg, initial encounter for closed fracture: Secondary | ICD-10-CM | POA: Diagnosis not present

## 2014-01-28 DIAGNOSIS — G8929 Other chronic pain: Secondary | ICD-10-CM | POA: Diagnosis not present

## 2014-01-28 DIAGNOSIS — IMO0001 Reserved for inherently not codable concepts without codable children: Secondary | ICD-10-CM | POA: Diagnosis not present

## 2014-01-29 DIAGNOSIS — I4891 Unspecified atrial fibrillation: Secondary | ICD-10-CM | POA: Diagnosis not present

## 2014-01-29 DIAGNOSIS — G8929 Other chronic pain: Secondary | ICD-10-CM | POA: Diagnosis not present

## 2014-01-29 DIAGNOSIS — IMO0001 Reserved for inherently not codable concepts without codable children: Secondary | ICD-10-CM | POA: Diagnosis not present

## 2014-01-29 DIAGNOSIS — Z5189 Encounter for other specified aftercare: Secondary | ICD-10-CM | POA: Diagnosis not present

## 2014-01-29 DIAGNOSIS — G35 Multiple sclerosis: Secondary | ICD-10-CM | POA: Diagnosis not present

## 2014-01-29 DIAGNOSIS — I10 Essential (primary) hypertension: Secondary | ICD-10-CM | POA: Diagnosis not present

## 2014-01-30 DIAGNOSIS — I4891 Unspecified atrial fibrillation: Secondary | ICD-10-CM | POA: Diagnosis not present

## 2014-01-30 DIAGNOSIS — IMO0001 Reserved for inherently not codable concepts without codable children: Secondary | ICD-10-CM | POA: Diagnosis not present

## 2014-01-30 DIAGNOSIS — I10 Essential (primary) hypertension: Secondary | ICD-10-CM | POA: Diagnosis not present

## 2014-01-30 DIAGNOSIS — G8929 Other chronic pain: Secondary | ICD-10-CM | POA: Diagnosis not present

## 2014-01-30 DIAGNOSIS — Z5189 Encounter for other specified aftercare: Secondary | ICD-10-CM | POA: Diagnosis not present

## 2014-01-30 DIAGNOSIS — G35 Multiple sclerosis: Secondary | ICD-10-CM | POA: Diagnosis not present

## 2014-02-02 DIAGNOSIS — IMO0001 Reserved for inherently not codable concepts without codable children: Secondary | ICD-10-CM | POA: Diagnosis not present

## 2014-02-02 DIAGNOSIS — I4891 Unspecified atrial fibrillation: Secondary | ICD-10-CM | POA: Diagnosis not present

## 2014-02-02 DIAGNOSIS — G35 Multiple sclerosis: Secondary | ICD-10-CM | POA: Diagnosis not present

## 2014-02-02 DIAGNOSIS — I10 Essential (primary) hypertension: Secondary | ICD-10-CM | POA: Diagnosis not present

## 2014-02-02 DIAGNOSIS — G8929 Other chronic pain: Secondary | ICD-10-CM | POA: Diagnosis not present

## 2014-02-02 DIAGNOSIS — Z5189 Encounter for other specified aftercare: Secondary | ICD-10-CM | POA: Diagnosis not present

## 2014-02-03 DIAGNOSIS — IMO0001 Reserved for inherently not codable concepts without codable children: Secondary | ICD-10-CM | POA: Diagnosis not present

## 2014-02-03 DIAGNOSIS — G35 Multiple sclerosis: Secondary | ICD-10-CM | POA: Diagnosis not present

## 2014-02-03 DIAGNOSIS — Z5189 Encounter for other specified aftercare: Secondary | ICD-10-CM | POA: Diagnosis not present

## 2014-02-03 DIAGNOSIS — I4891 Unspecified atrial fibrillation: Secondary | ICD-10-CM | POA: Diagnosis not present

## 2014-02-03 DIAGNOSIS — I10 Essential (primary) hypertension: Secondary | ICD-10-CM | POA: Diagnosis not present

## 2014-02-03 DIAGNOSIS — G8929 Other chronic pain: Secondary | ICD-10-CM | POA: Diagnosis not present

## 2014-02-05 DIAGNOSIS — I10 Essential (primary) hypertension: Secondary | ICD-10-CM | POA: Diagnosis not present

## 2014-02-05 DIAGNOSIS — G35 Multiple sclerosis: Secondary | ICD-10-CM | POA: Diagnosis not present

## 2014-02-05 DIAGNOSIS — G8929 Other chronic pain: Secondary | ICD-10-CM | POA: Diagnosis not present

## 2014-02-05 DIAGNOSIS — Z5189 Encounter for other specified aftercare: Secondary | ICD-10-CM | POA: Diagnosis not present

## 2014-02-05 DIAGNOSIS — I4891 Unspecified atrial fibrillation: Secondary | ICD-10-CM | POA: Diagnosis not present

## 2014-02-05 DIAGNOSIS — IMO0001 Reserved for inherently not codable concepts without codable children: Secondary | ICD-10-CM | POA: Diagnosis not present

## 2014-02-06 DIAGNOSIS — I10 Essential (primary) hypertension: Secondary | ICD-10-CM | POA: Diagnosis not present

## 2014-02-06 DIAGNOSIS — Z5189 Encounter for other specified aftercare: Secondary | ICD-10-CM | POA: Diagnosis not present

## 2014-02-06 DIAGNOSIS — IMO0001 Reserved for inherently not codable concepts without codable children: Secondary | ICD-10-CM | POA: Diagnosis not present

## 2014-02-06 DIAGNOSIS — I4891 Unspecified atrial fibrillation: Secondary | ICD-10-CM | POA: Diagnosis not present

## 2014-02-06 DIAGNOSIS — G8929 Other chronic pain: Secondary | ICD-10-CM | POA: Diagnosis not present

## 2014-02-06 DIAGNOSIS — G35 Multiple sclerosis: Secondary | ICD-10-CM | POA: Diagnosis not present

## 2014-02-09 ENCOUNTER — Encounter: Payer: Medicare Other | Admitting: Diagnostic Neuroimaging

## 2014-02-09 DIAGNOSIS — I4891 Unspecified atrial fibrillation: Secondary | ICD-10-CM | POA: Diagnosis not present

## 2014-02-09 DIAGNOSIS — I10 Essential (primary) hypertension: Secondary | ICD-10-CM | POA: Diagnosis not present

## 2014-02-09 DIAGNOSIS — Z5189 Encounter for other specified aftercare: Secondary | ICD-10-CM | POA: Diagnosis not present

## 2014-02-09 DIAGNOSIS — IMO0001 Reserved for inherently not codable concepts without codable children: Secondary | ICD-10-CM | POA: Diagnosis not present

## 2014-02-09 DIAGNOSIS — G35 Multiple sclerosis: Secondary | ICD-10-CM | POA: Diagnosis not present

## 2014-02-09 DIAGNOSIS — G8929 Other chronic pain: Secondary | ICD-10-CM | POA: Diagnosis not present

## 2014-02-10 DIAGNOSIS — G35 Multiple sclerosis: Secondary | ICD-10-CM | POA: Diagnosis not present

## 2014-02-10 DIAGNOSIS — G8929 Other chronic pain: Secondary | ICD-10-CM | POA: Diagnosis not present

## 2014-02-10 DIAGNOSIS — I10 Essential (primary) hypertension: Secondary | ICD-10-CM | POA: Diagnosis not present

## 2014-02-10 DIAGNOSIS — IMO0001 Reserved for inherently not codable concepts without codable children: Secondary | ICD-10-CM | POA: Diagnosis not present

## 2014-02-10 DIAGNOSIS — Z5189 Encounter for other specified aftercare: Secondary | ICD-10-CM | POA: Diagnosis not present

## 2014-02-10 DIAGNOSIS — I4891 Unspecified atrial fibrillation: Secondary | ICD-10-CM | POA: Diagnosis not present

## 2014-02-11 DIAGNOSIS — G8929 Other chronic pain: Secondary | ICD-10-CM | POA: Diagnosis not present

## 2014-02-11 DIAGNOSIS — I4891 Unspecified atrial fibrillation: Secondary | ICD-10-CM | POA: Diagnosis not present

## 2014-02-11 DIAGNOSIS — Z5189 Encounter for other specified aftercare: Secondary | ICD-10-CM | POA: Diagnosis not present

## 2014-02-11 DIAGNOSIS — IMO0001 Reserved for inherently not codable concepts without codable children: Secondary | ICD-10-CM | POA: Diagnosis not present

## 2014-02-11 DIAGNOSIS — I10 Essential (primary) hypertension: Secondary | ICD-10-CM | POA: Diagnosis not present

## 2014-02-11 DIAGNOSIS — G35 Multiple sclerosis: Secondary | ICD-10-CM | POA: Diagnosis not present

## 2014-02-12 DIAGNOSIS — I4891 Unspecified atrial fibrillation: Secondary | ICD-10-CM | POA: Diagnosis not present

## 2014-02-12 DIAGNOSIS — Z5189 Encounter for other specified aftercare: Secondary | ICD-10-CM | POA: Diagnosis not present

## 2014-02-12 DIAGNOSIS — G8929 Other chronic pain: Secondary | ICD-10-CM | POA: Diagnosis not present

## 2014-02-12 DIAGNOSIS — G35 Multiple sclerosis: Secondary | ICD-10-CM | POA: Diagnosis not present

## 2014-02-12 DIAGNOSIS — I10 Essential (primary) hypertension: Secondary | ICD-10-CM | POA: Diagnosis not present

## 2014-02-12 DIAGNOSIS — IMO0001 Reserved for inherently not codable concepts without codable children: Secondary | ICD-10-CM | POA: Diagnosis not present

## 2014-02-14 DIAGNOSIS — I4891 Unspecified atrial fibrillation: Secondary | ICD-10-CM | POA: Diagnosis not present

## 2014-02-14 DIAGNOSIS — G35 Multiple sclerosis: Secondary | ICD-10-CM | POA: Diagnosis not present

## 2014-02-14 DIAGNOSIS — I10 Essential (primary) hypertension: Secondary | ICD-10-CM | POA: Diagnosis not present

## 2014-02-14 DIAGNOSIS — Z5189 Encounter for other specified aftercare: Secondary | ICD-10-CM | POA: Diagnosis not present

## 2014-02-14 DIAGNOSIS — IMO0001 Reserved for inherently not codable concepts without codable children: Secondary | ICD-10-CM | POA: Diagnosis not present

## 2014-02-14 DIAGNOSIS — G8929 Other chronic pain: Secondary | ICD-10-CM | POA: Diagnosis not present

## 2014-02-16 ENCOUNTER — Ambulatory Visit (INDEPENDENT_AMBULATORY_CARE_PROVIDER_SITE_OTHER): Payer: Medicare Other | Admitting: Pharmacist Clinician (PhC)/ Clinical Pharmacy Specialist

## 2014-02-16 DIAGNOSIS — E039 Hypothyroidism, unspecified: Secondary | ICD-10-CM | POA: Diagnosis not present

## 2014-02-16 DIAGNOSIS — Z7901 Long term (current) use of anticoagulants: Secondary | ICD-10-CM | POA: Diagnosis not present

## 2014-02-16 DIAGNOSIS — Z5189 Encounter for other specified aftercare: Secondary | ICD-10-CM | POA: Diagnosis not present

## 2014-02-16 DIAGNOSIS — F341 Dysthymic disorder: Secondary | ICD-10-CM | POA: Diagnosis not present

## 2014-02-16 DIAGNOSIS — G8929 Other chronic pain: Secondary | ICD-10-CM | POA: Diagnosis not present

## 2014-02-16 DIAGNOSIS — S82899A Other fracture of unspecified lower leg, initial encounter for closed fracture: Secondary | ICD-10-CM | POA: Diagnosis not present

## 2014-02-16 DIAGNOSIS — I10 Essential (primary) hypertension: Secondary | ICD-10-CM | POA: Diagnosis not present

## 2014-02-16 DIAGNOSIS — G35 Multiple sclerosis: Secondary | ICD-10-CM | POA: Diagnosis not present

## 2014-02-16 DIAGNOSIS — I4891 Unspecified atrial fibrillation: Secondary | ICD-10-CM | POA: Diagnosis not present

## 2014-02-16 DIAGNOSIS — IMO0001 Reserved for inherently not codable concepts without codable children: Secondary | ICD-10-CM | POA: Diagnosis not present

## 2014-02-16 DIAGNOSIS — K219 Gastro-esophageal reflux disease without esophagitis: Secondary | ICD-10-CM | POA: Diagnosis not present

## 2014-02-16 DIAGNOSIS — E785 Hyperlipidemia, unspecified: Secondary | ICD-10-CM | POA: Diagnosis not present

## 2014-02-16 LAB — POCT INR: INR: 3.6

## 2014-02-17 ENCOUNTER — Encounter: Payer: Self-pay | Admitting: Neurology

## 2014-02-17 ENCOUNTER — Encounter: Payer: Medicare Other | Admitting: Neurology

## 2014-02-17 ENCOUNTER — Ambulatory Visit (INDEPENDENT_AMBULATORY_CARE_PROVIDER_SITE_OTHER): Payer: Medicare Other | Admitting: Neurology

## 2014-02-17 VITALS — BP 132/79 | HR 70

## 2014-02-17 DIAGNOSIS — G8929 Other chronic pain: Secondary | ICD-10-CM | POA: Diagnosis not present

## 2014-02-17 DIAGNOSIS — I4891 Unspecified atrial fibrillation: Secondary | ICD-10-CM | POA: Diagnosis not present

## 2014-02-17 DIAGNOSIS — Z5189 Encounter for other specified aftercare: Secondary | ICD-10-CM | POA: Diagnosis not present

## 2014-02-17 DIAGNOSIS — G35 Multiple sclerosis: Secondary | ICD-10-CM | POA: Diagnosis not present

## 2014-02-17 DIAGNOSIS — IMO0001 Reserved for inherently not codable concepts without codable children: Secondary | ICD-10-CM | POA: Diagnosis not present

## 2014-02-17 DIAGNOSIS — I10 Essential (primary) hypertension: Secondary | ICD-10-CM | POA: Diagnosis not present

## 2014-02-17 DIAGNOSIS — G35D Multiple sclerosis, unspecified: Secondary | ICD-10-CM | POA: Diagnosis not present

## 2014-02-17 NOTE — Procedures (Signed)
     History:  Toni [name] is a 68 year old patient with a history of multiple sclerosis. She has sustained a fracture of the right ankle, and she is recovering from this. She has recently returned home, but she still has some discomfort and swelling of the right ankle. She has noted some problems with walking in the last 2 weeks, with a tendency of the legs to collapse. She is taking and occasional oral baclofen tablet. She returns for a baclofen pump refill.  Baclofen pump refill note  The baclofen pump site was cleaned with Betadine solution. A 21-gauge needle was inserted into the pump port site. Approximately 6 cc of residual baclofen was removed. 40 cc of replacement baclofen was placed into the pump at 2000 mcg/cc concentration.  The pump was reprogrammed for the following settings: Basal rate of 36.0 mcg per hour with exception of from 1 AM to 5 AM, the pump goes to a basal rate of 25.0 mcg per hour. From 1 AM to 5 AM, the total dose is 124.9 mcg. The total daily dose is 809.1 mcg. These settings were not changed from the prior settings.  The alarm volume is set at 1.5 cc. The next alarm date is 05/23/2014.  The patient tolerated the procedure well. There were no complications of the above procedure.  The pump indicates a residual volume of 2.8 cc, 6 cc were removed. This issue will need to be followed, as it may indicate a partial pump failure.

## 2014-02-18 ENCOUNTER — Telehealth: Payer: Self-pay | Admitting: Neurology

## 2014-02-18 DIAGNOSIS — I10 Essential (primary) hypertension: Secondary | ICD-10-CM | POA: Diagnosis not present

## 2014-02-18 DIAGNOSIS — G35 Multiple sclerosis: Secondary | ICD-10-CM | POA: Diagnosis not present

## 2014-02-18 DIAGNOSIS — Z5189 Encounter for other specified aftercare: Secondary | ICD-10-CM | POA: Diagnosis not present

## 2014-02-18 DIAGNOSIS — G8929 Other chronic pain: Secondary | ICD-10-CM | POA: Diagnosis not present

## 2014-02-18 DIAGNOSIS — IMO0001 Reserved for inherently not codable concepts without codable children: Secondary | ICD-10-CM | POA: Diagnosis not present

## 2014-02-18 DIAGNOSIS — I4891 Unspecified atrial fibrillation: Secondary | ICD-10-CM | POA: Diagnosis not present

## 2014-02-18 NOTE — Telephone Encounter (Signed)
Spoke with patient and she said that she is having muscle spasms in both legs/feet,was walking with walker and her right leg gave away, now it has started in the left leg.  She has 15 Xcthar shots at home and has taken 5,  can she take the remainder?  She is also waiting on response from insurance company concerning approval for Acthar inj, explained per telephone note from (01/23/14),Jessica said that she had contacted them and is waiting for a response, she verbalized understanding.

## 2014-02-18 NOTE — Telephone Encounter (Signed)
Patient is having MS problems--had pump refill yesterday--please call.

## 2014-02-18 NOTE — Telephone Encounter (Signed)
Informed patient per Dr Edwena Felty message,she verbalized understanding

## 2014-02-18 NOTE — Telephone Encounter (Signed)
Yes, take the reminder.

## 2014-02-18 NOTE — Telephone Encounter (Signed)
Patient said that she will call back to update

## 2014-02-19 DIAGNOSIS — I4891 Unspecified atrial fibrillation: Secondary | ICD-10-CM | POA: Diagnosis not present

## 2014-02-19 DIAGNOSIS — G8929 Other chronic pain: Secondary | ICD-10-CM | POA: Diagnosis not present

## 2014-02-19 DIAGNOSIS — Z5189 Encounter for other specified aftercare: Secondary | ICD-10-CM | POA: Diagnosis not present

## 2014-02-19 DIAGNOSIS — G35 Multiple sclerosis: Secondary | ICD-10-CM | POA: Diagnosis not present

## 2014-02-19 DIAGNOSIS — IMO0001 Reserved for inherently not codable concepts without codable children: Secondary | ICD-10-CM | POA: Diagnosis not present

## 2014-02-19 DIAGNOSIS — I10 Essential (primary) hypertension: Secondary | ICD-10-CM | POA: Diagnosis not present

## 2014-02-20 DIAGNOSIS — G8929 Other chronic pain: Secondary | ICD-10-CM | POA: Diagnosis not present

## 2014-02-20 DIAGNOSIS — Z5189 Encounter for other specified aftercare: Secondary | ICD-10-CM | POA: Diagnosis not present

## 2014-02-20 DIAGNOSIS — IMO0001 Reserved for inherently not codable concepts without codable children: Secondary | ICD-10-CM | POA: Diagnosis not present

## 2014-02-20 DIAGNOSIS — I10 Essential (primary) hypertension: Secondary | ICD-10-CM | POA: Diagnosis not present

## 2014-02-20 DIAGNOSIS — G35 Multiple sclerosis: Secondary | ICD-10-CM | POA: Diagnosis not present

## 2014-02-20 DIAGNOSIS — I4891 Unspecified atrial fibrillation: Secondary | ICD-10-CM | POA: Diagnosis not present

## 2014-02-23 DIAGNOSIS — I4891 Unspecified atrial fibrillation: Secondary | ICD-10-CM | POA: Diagnosis not present

## 2014-02-23 DIAGNOSIS — G35 Multiple sclerosis: Secondary | ICD-10-CM | POA: Diagnosis not present

## 2014-02-23 DIAGNOSIS — G8929 Other chronic pain: Secondary | ICD-10-CM | POA: Diagnosis not present

## 2014-02-23 DIAGNOSIS — I10 Essential (primary) hypertension: Secondary | ICD-10-CM | POA: Diagnosis not present

## 2014-02-23 DIAGNOSIS — Z5189 Encounter for other specified aftercare: Secondary | ICD-10-CM | POA: Diagnosis not present

## 2014-02-23 DIAGNOSIS — IMO0001 Reserved for inherently not codable concepts without codable children: Secondary | ICD-10-CM | POA: Diagnosis not present

## 2014-02-24 DIAGNOSIS — I10 Essential (primary) hypertension: Secondary | ICD-10-CM | POA: Diagnosis not present

## 2014-02-24 DIAGNOSIS — IMO0001 Reserved for inherently not codable concepts without codable children: Secondary | ICD-10-CM | POA: Diagnosis not present

## 2014-02-24 DIAGNOSIS — G8929 Other chronic pain: Secondary | ICD-10-CM | POA: Diagnosis not present

## 2014-02-24 DIAGNOSIS — I4891 Unspecified atrial fibrillation: Secondary | ICD-10-CM | POA: Diagnosis not present

## 2014-02-24 DIAGNOSIS — Z5189 Encounter for other specified aftercare: Secondary | ICD-10-CM | POA: Diagnosis not present

## 2014-02-24 DIAGNOSIS — G35 Multiple sclerosis: Secondary | ICD-10-CM | POA: Diagnosis not present

## 2014-02-25 ENCOUNTER — Other Ambulatory Visit: Payer: Self-pay | Admitting: Neurology

## 2014-02-25 DIAGNOSIS — I10 Essential (primary) hypertension: Secondary | ICD-10-CM | POA: Diagnosis not present

## 2014-02-25 DIAGNOSIS — G8929 Other chronic pain: Secondary | ICD-10-CM | POA: Diagnosis not present

## 2014-02-25 DIAGNOSIS — IMO0001 Reserved for inherently not codable concepts without codable children: Secondary | ICD-10-CM | POA: Diagnosis not present

## 2014-02-25 DIAGNOSIS — G35 Multiple sclerosis: Secondary | ICD-10-CM | POA: Diagnosis not present

## 2014-02-25 DIAGNOSIS — S82843A Displaced bimalleolar fracture of unspecified lower leg, initial encounter for closed fracture: Secondary | ICD-10-CM | POA: Diagnosis not present

## 2014-02-25 DIAGNOSIS — I4891 Unspecified atrial fibrillation: Secondary | ICD-10-CM | POA: Diagnosis not present

## 2014-02-25 DIAGNOSIS — Z5189 Encounter for other specified aftercare: Secondary | ICD-10-CM | POA: Diagnosis not present

## 2014-02-25 MED ORDER — ALPRAZOLAM 0.5 MG PO TABS: 0.5000 mg | ORAL_TABLET | Freq: Two times a day (BID) | ORAL | Status: DC | PRN

## 2014-02-25 NOTE — Telephone Encounter (Signed)
Patient requesting refill of Xanax to be sent to [facility].

## 2014-02-25 NOTE — Telephone Encounter (Signed)
Rx signed and faxed.

## 2014-02-26 ENCOUNTER — Telehealth: Payer: Self-pay | Admitting: Neurology

## 2014-02-26 DIAGNOSIS — I10 Essential (primary) hypertension: Secondary | ICD-10-CM | POA: Diagnosis not present

## 2014-02-26 DIAGNOSIS — I4891 Unspecified atrial fibrillation: Secondary | ICD-10-CM | POA: Diagnosis not present

## 2014-02-26 DIAGNOSIS — Z5189 Encounter for other specified aftercare: Secondary | ICD-10-CM | POA: Diagnosis not present

## 2014-02-26 DIAGNOSIS — G35 Multiple sclerosis: Secondary | ICD-10-CM | POA: Diagnosis not present

## 2014-02-26 DIAGNOSIS — IMO0001 Reserved for inherently not codable concepts without codable children: Secondary | ICD-10-CM | POA: Diagnosis not present

## 2014-02-26 DIAGNOSIS — G8929 Other chronic pain: Secondary | ICD-10-CM | POA: Diagnosis not present

## 2014-02-26 NOTE — Telephone Encounter (Signed)
Patient has questions regarding insurance approval for the MS medication Acthar. Please call to advise.

## 2014-02-26 NOTE — Telephone Encounter (Signed)
I called [employer], they said the patient does not have active coverage with them for medications.  I contacted [employer] ASAP and they are looking into the patient file and will call back with update.

## 2014-02-27 ENCOUNTER — Telehealth: Payer: Self-pay | Admitting: Neurology

## 2014-02-27 DIAGNOSIS — IMO0001 Reserved for inherently not codable concepts without codable children: Secondary | ICD-10-CM | POA: Diagnosis not present

## 2014-02-27 DIAGNOSIS — G35 Multiple sclerosis: Secondary | ICD-10-CM | POA: Diagnosis not present

## 2014-02-27 DIAGNOSIS — Z5189 Encounter for other specified aftercare: Secondary | ICD-10-CM | POA: Diagnosis not present

## 2014-02-27 DIAGNOSIS — G8929 Other chronic pain: Secondary | ICD-10-CM | POA: Diagnosis not present

## 2014-02-27 DIAGNOSIS — I10 Essential (primary) hypertension: Secondary | ICD-10-CM | POA: Diagnosis not present

## 2014-02-27 DIAGNOSIS — I4891 Unspecified atrial fibrillation: Secondary | ICD-10-CM | POA: Diagnosis not present

## 2014-02-27 NOTE — Telephone Encounter (Signed)
Patient is calling again regarding pre authorization for Acthar--patient spoke with [employer] and they will only approve medication through 03-18-14--our office has to call [employer] to file an appeal--please call patient and advise--thank you.

## 2014-02-27 NOTE — Telephone Encounter (Signed)
I have been in contact with [employer].  Sharyon Cable will follow up with the insurance to try and get this med authorized for an extended period of time.  We faxed all clinical info to him at [phone].  I called the patient back.  Spoke with the patient.  She is aware.

## 2014-02-27 NOTE — Telephone Encounter (Signed)
[employer] sent [name] a letter saying they have approved our request for coverage on Acthar effective until 03/18/2014 Ref # [id number]  Patient ID # [id number].  They indicate they have notified the patient of the approval as well.   I called the patient back, got no answer.  Left message.

## 2014-02-27 NOTE — Telephone Encounter (Signed)
Patient received notification from [employer] regarding authorization for Acthar until 03/18/14... FYI

## 2014-03-02 ENCOUNTER — Ambulatory Visit (INDEPENDENT_AMBULATORY_CARE_PROVIDER_SITE_OTHER): Payer: Medicare Other | Admitting: Pharmacist

## 2014-03-02 DIAGNOSIS — Z7901 Long term (current) use of anticoagulants: Secondary | ICD-10-CM

## 2014-03-02 DIAGNOSIS — G8929 Other chronic pain: Secondary | ICD-10-CM | POA: Diagnosis not present

## 2014-03-02 DIAGNOSIS — I4891 Unspecified atrial fibrillation: Secondary | ICD-10-CM | POA: Diagnosis not present

## 2014-03-02 DIAGNOSIS — Z5189 Encounter for other specified aftercare: Secondary | ICD-10-CM | POA: Diagnosis not present

## 2014-03-02 DIAGNOSIS — I10 Essential (primary) hypertension: Secondary | ICD-10-CM | POA: Diagnosis not present

## 2014-03-02 DIAGNOSIS — G35 Multiple sclerosis: Secondary | ICD-10-CM | POA: Diagnosis not present

## 2014-03-02 DIAGNOSIS — IMO0001 Reserved for inherently not codable concepts without codable children: Secondary | ICD-10-CM | POA: Diagnosis not present

## 2014-03-02 LAB — POCT INR: INR: 2

## 2014-03-03 DIAGNOSIS — IMO0001 Reserved for inherently not codable concepts without codable children: Secondary | ICD-10-CM | POA: Diagnosis not present

## 2014-03-03 DIAGNOSIS — Z124 Encounter for screening for malignant neoplasm of cervix: Secondary | ICD-10-CM | POA: Diagnosis not present

## 2014-03-03 DIAGNOSIS — I4891 Unspecified atrial fibrillation: Secondary | ICD-10-CM | POA: Diagnosis not present

## 2014-03-03 DIAGNOSIS — Z9189 Other specified personal risk factors, not elsewhere classified: Secondary | ICD-10-CM | POA: Diagnosis not present

## 2014-03-03 DIAGNOSIS — Z5189 Encounter for other specified aftercare: Secondary | ICD-10-CM | POA: Diagnosis not present

## 2014-03-03 DIAGNOSIS — I10 Essential (primary) hypertension: Secondary | ICD-10-CM | POA: Diagnosis not present

## 2014-03-03 DIAGNOSIS — G8929 Other chronic pain: Secondary | ICD-10-CM | POA: Diagnosis not present

## 2014-03-03 DIAGNOSIS — G35 Multiple sclerosis: Secondary | ICD-10-CM | POA: Diagnosis not present

## 2014-03-04 DIAGNOSIS — G8929 Other chronic pain: Secondary | ICD-10-CM | POA: Diagnosis not present

## 2014-03-04 DIAGNOSIS — I4891 Unspecified atrial fibrillation: Secondary | ICD-10-CM | POA: Diagnosis not present

## 2014-03-04 DIAGNOSIS — IMO0001 Reserved for inherently not codable concepts without codable children: Secondary | ICD-10-CM | POA: Diagnosis not present

## 2014-03-04 DIAGNOSIS — G35 Multiple sclerosis: Secondary | ICD-10-CM | POA: Diagnosis not present

## 2014-03-04 DIAGNOSIS — Z5189 Encounter for other specified aftercare: Secondary | ICD-10-CM | POA: Diagnosis not present

## 2014-03-04 DIAGNOSIS — I10 Essential (primary) hypertension: Secondary | ICD-10-CM | POA: Diagnosis not present

## 2014-03-05 DIAGNOSIS — IMO0001 Reserved for inherently not codable concepts without codable children: Secondary | ICD-10-CM | POA: Diagnosis not present

## 2014-03-05 DIAGNOSIS — I4891 Unspecified atrial fibrillation: Secondary | ICD-10-CM | POA: Diagnosis not present

## 2014-03-05 DIAGNOSIS — G35 Multiple sclerosis: Secondary | ICD-10-CM | POA: Diagnosis not present

## 2014-03-05 DIAGNOSIS — I10 Essential (primary) hypertension: Secondary | ICD-10-CM | POA: Diagnosis not present

## 2014-03-05 DIAGNOSIS — G8929 Other chronic pain: Secondary | ICD-10-CM | POA: Diagnosis not present

## 2014-03-05 DIAGNOSIS — Z5189 Encounter for other specified aftercare: Secondary | ICD-10-CM | POA: Diagnosis not present

## 2014-03-06 ENCOUNTER — Telehealth: Payer: Self-pay | Admitting: Neurology

## 2014-03-06 ENCOUNTER — Other Ambulatory Visit: Payer: Self-pay | Admitting: Nurse Practitioner

## 2014-03-06 DIAGNOSIS — I4891 Unspecified atrial fibrillation: Secondary | ICD-10-CM | POA: Diagnosis not present

## 2014-03-06 DIAGNOSIS — IMO0001 Reserved for inherently not codable concepts without codable children: Secondary | ICD-10-CM | POA: Diagnosis not present

## 2014-03-06 DIAGNOSIS — I10 Essential (primary) hypertension: Secondary | ICD-10-CM | POA: Diagnosis not present

## 2014-03-06 DIAGNOSIS — Z5189 Encounter for other specified aftercare: Secondary | ICD-10-CM | POA: Diagnosis not present

## 2014-03-06 DIAGNOSIS — G8929 Other chronic pain: Secondary | ICD-10-CM | POA: Diagnosis not present

## 2014-03-06 DIAGNOSIS — G35 Multiple sclerosis: Secondary | ICD-10-CM | POA: Diagnosis not present

## 2014-03-06 MED ORDER — GABAPENTIN 300 MG PO CAPS: 300.0000 mg | ORAL_CAPSULE | Freq: Three times a day (TID) | ORAL | Status: DC | PRN

## 2014-03-06 NOTE — Telephone Encounter (Signed)
Called pt to inform her per Jeani Hawking, NP that the pt may increase to 2- 300 mg capsules of gabapentin three times daily if needed and the the order has been changed and if she has any other problems, questions or concerns to call the office. Pt verbalized understanding.

## 2014-03-06 NOTE — Telephone Encounter (Signed)
She may increase to 2- 300 mg capsules three times daily if needed. I will change the order.  Please call and let her know. Thanks. -LL

## 2014-03-06 NOTE — Telephone Encounter (Signed)
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 Please return call to patient and advise.

## 2014-03-06 NOTE — Telephone Encounter (Signed)
Pt calling stating that her Neuropathy has gotten worst in her Left hand and foot and wants to know if she could take more of the gabapentin. Pt is now taking gabapentin 300 mg 3 x day. Pt was in the office on 02/17/14 for baclofen pump refill. Pt was last seen by Jeani Hawking, NP on 10/16/13. Pt next OV is on 04/23/14 with Dr. Brett Fairy. Please advise

## 2014-03-09 ENCOUNTER — Ambulatory Visit: Payer: Medicare Other

## 2014-03-09 DIAGNOSIS — I4891 Unspecified atrial fibrillation: Secondary | ICD-10-CM | POA: Diagnosis not present

## 2014-03-09 DIAGNOSIS — Z5189 Encounter for other specified aftercare: Secondary | ICD-10-CM | POA: Diagnosis not present

## 2014-03-09 DIAGNOSIS — G35 Multiple sclerosis: Secondary | ICD-10-CM | POA: Diagnosis not present

## 2014-03-09 DIAGNOSIS — IMO0001 Reserved for inherently not codable concepts without codable children: Secondary | ICD-10-CM | POA: Diagnosis not present

## 2014-03-09 DIAGNOSIS — I10 Essential (primary) hypertension: Secondary | ICD-10-CM | POA: Diagnosis not present

## 2014-03-09 DIAGNOSIS — G8929 Other chronic pain: Secondary | ICD-10-CM | POA: Diagnosis not present

## 2014-03-10 DIAGNOSIS — I10 Essential (primary) hypertension: Secondary | ICD-10-CM | POA: Diagnosis not present

## 2014-03-10 DIAGNOSIS — G35 Multiple sclerosis: Secondary | ICD-10-CM | POA: Diagnosis not present

## 2014-03-10 DIAGNOSIS — G8929 Other chronic pain: Secondary | ICD-10-CM | POA: Diagnosis not present

## 2014-03-10 DIAGNOSIS — IMO0001 Reserved for inherently not codable concepts without codable children: Secondary | ICD-10-CM | POA: Diagnosis not present

## 2014-03-10 DIAGNOSIS — Z5189 Encounter for other specified aftercare: Secondary | ICD-10-CM | POA: Diagnosis not present

## 2014-03-10 DIAGNOSIS — I4891 Unspecified atrial fibrillation: Secondary | ICD-10-CM | POA: Diagnosis not present

## 2014-03-11 DIAGNOSIS — I4891 Unspecified atrial fibrillation: Secondary | ICD-10-CM | POA: Diagnosis not present

## 2014-03-11 DIAGNOSIS — I10 Essential (primary) hypertension: Secondary | ICD-10-CM | POA: Diagnosis not present

## 2014-03-11 DIAGNOSIS — G35 Multiple sclerosis: Secondary | ICD-10-CM | POA: Diagnosis not present

## 2014-03-11 DIAGNOSIS — Z5189 Encounter for other specified aftercare: Secondary | ICD-10-CM | POA: Diagnosis not present

## 2014-03-11 DIAGNOSIS — IMO0001 Reserved for inherently not codable concepts without codable children: Secondary | ICD-10-CM | POA: Diagnosis not present

## 2014-03-11 DIAGNOSIS — G8929 Other chronic pain: Secondary | ICD-10-CM | POA: Diagnosis not present

## 2014-03-12 DIAGNOSIS — Z5189 Encounter for other specified aftercare: Secondary | ICD-10-CM | POA: Diagnosis not present

## 2014-03-12 DIAGNOSIS — IMO0001 Reserved for inherently not codable concepts without codable children: Secondary | ICD-10-CM | POA: Diagnosis not present

## 2014-03-12 DIAGNOSIS — I10 Essential (primary) hypertension: Secondary | ICD-10-CM | POA: Diagnosis not present

## 2014-03-12 DIAGNOSIS — G35 Multiple sclerosis: Secondary | ICD-10-CM | POA: Diagnosis not present

## 2014-03-12 DIAGNOSIS — I4891 Unspecified atrial fibrillation: Secondary | ICD-10-CM | POA: Diagnosis not present

## 2014-03-12 DIAGNOSIS — G8929 Other chronic pain: Secondary | ICD-10-CM | POA: Diagnosis not present

## 2014-03-13 DIAGNOSIS — Z5189 Encounter for other specified aftercare: Secondary | ICD-10-CM | POA: Diagnosis not present

## 2014-03-13 DIAGNOSIS — I4891 Unspecified atrial fibrillation: Secondary | ICD-10-CM | POA: Diagnosis not present

## 2014-03-13 DIAGNOSIS — I10 Essential (primary) hypertension: Secondary | ICD-10-CM | POA: Diagnosis not present

## 2014-03-13 DIAGNOSIS — IMO0001 Reserved for inherently not codable concepts without codable children: Secondary | ICD-10-CM | POA: Diagnosis not present

## 2014-03-13 DIAGNOSIS — G35 Multiple sclerosis: Secondary | ICD-10-CM | POA: Diagnosis not present

## 2014-03-13 DIAGNOSIS — G8929 Other chronic pain: Secondary | ICD-10-CM | POA: Diagnosis not present

## 2014-03-16 DIAGNOSIS — I10 Essential (primary) hypertension: Secondary | ICD-10-CM | POA: Diagnosis not present

## 2014-03-16 DIAGNOSIS — IMO0001 Reserved for inherently not codable concepts without codable children: Secondary | ICD-10-CM | POA: Diagnosis not present

## 2014-03-16 DIAGNOSIS — G8929 Other chronic pain: Secondary | ICD-10-CM | POA: Diagnosis not present

## 2014-03-16 DIAGNOSIS — I4891 Unspecified atrial fibrillation: Secondary | ICD-10-CM | POA: Diagnosis not present

## 2014-03-16 DIAGNOSIS — G35 Multiple sclerosis: Secondary | ICD-10-CM | POA: Diagnosis not present

## 2014-03-16 DIAGNOSIS — Z5189 Encounter for other specified aftercare: Secondary | ICD-10-CM | POA: Diagnosis not present

## 2014-03-18 DIAGNOSIS — I10 Essential (primary) hypertension: Secondary | ICD-10-CM | POA: Diagnosis not present

## 2014-03-18 DIAGNOSIS — Z5189 Encounter for other specified aftercare: Secondary | ICD-10-CM | POA: Diagnosis not present

## 2014-03-18 DIAGNOSIS — IMO0001 Reserved for inherently not codable concepts without codable children: Secondary | ICD-10-CM | POA: Diagnosis not present

## 2014-03-18 DIAGNOSIS — I4891 Unspecified atrial fibrillation: Secondary | ICD-10-CM | POA: Diagnosis not present

## 2014-03-18 DIAGNOSIS — G8929 Other chronic pain: Secondary | ICD-10-CM | POA: Diagnosis not present

## 2014-03-18 DIAGNOSIS — G35 Multiple sclerosis: Secondary | ICD-10-CM | POA: Diagnosis not present

## 2014-03-19 DIAGNOSIS — G35 Multiple sclerosis: Secondary | ICD-10-CM | POA: Diagnosis not present

## 2014-03-19 DIAGNOSIS — IMO0001 Reserved for inherently not codable concepts without codable children: Secondary | ICD-10-CM | POA: Diagnosis not present

## 2014-03-19 DIAGNOSIS — G8929 Other chronic pain: Secondary | ICD-10-CM | POA: Diagnosis not present

## 2014-03-19 DIAGNOSIS — Z5189 Encounter for other specified aftercare: Secondary | ICD-10-CM | POA: Diagnosis not present

## 2014-03-19 DIAGNOSIS — I4891 Unspecified atrial fibrillation: Secondary | ICD-10-CM | POA: Diagnosis not present

## 2014-03-19 DIAGNOSIS — I10 Essential (primary) hypertension: Secondary | ICD-10-CM | POA: Diagnosis not present

## 2014-03-20 DIAGNOSIS — I10 Essential (primary) hypertension: Secondary | ICD-10-CM | POA: Diagnosis not present

## 2014-03-20 DIAGNOSIS — Z5189 Encounter for other specified aftercare: Secondary | ICD-10-CM | POA: Diagnosis not present

## 2014-03-20 DIAGNOSIS — G8929 Other chronic pain: Secondary | ICD-10-CM | POA: Diagnosis not present

## 2014-03-20 DIAGNOSIS — IMO0001 Reserved for inherently not codable concepts without codable children: Secondary | ICD-10-CM | POA: Diagnosis not present

## 2014-03-20 DIAGNOSIS — I4891 Unspecified atrial fibrillation: Secondary | ICD-10-CM | POA: Diagnosis not present

## 2014-03-20 DIAGNOSIS — G35 Multiple sclerosis: Secondary | ICD-10-CM | POA: Diagnosis not present

## 2014-03-25 ENCOUNTER — Ambulatory Visit (INDEPENDENT_AMBULATORY_CARE_PROVIDER_SITE_OTHER): Payer: Medicare Other | Admitting: Pharmacist Clinician (PhC)/ Clinical Pharmacy Specialist

## 2014-03-25 DIAGNOSIS — I4891 Unspecified atrial fibrillation: Secondary | ICD-10-CM

## 2014-03-25 DIAGNOSIS — Z7901 Long term (current) use of anticoagulants: Secondary | ICD-10-CM | POA: Diagnosis not present

## 2014-03-25 LAB — POCT INR: INR: 2.7

## 2014-03-26 NOTE — Telephone Encounter (Signed)
Noted  

## 2014-04-03 ENCOUNTER — Other Ambulatory Visit: Payer: Self-pay | Admitting: Pharmacist Clinician (PhC)/ Clinical Pharmacy Specialist

## 2014-04-03 MED ORDER — WARFARIN SODIUM 5 MG PO TABS: 5.0000 mg | ORAL_TABLET | Freq: Every day | ORAL | Status: DC

## 2014-04-10 ENCOUNTER — Telehealth: Payer: Self-pay | Admitting: Neurology

## 2014-04-10 ENCOUNTER — Telehealth: Payer: Self-pay

## 2014-04-10 ENCOUNTER — Other Ambulatory Visit: Payer: Self-pay | Admitting: Neurology

## 2014-04-10 NOTE — Telephone Encounter (Signed)
Unfortunately, the length of time a PA is approved (in this case 1 month) is determined by ins, not MD.  The way the patient's ins is set up, we have to do a new PA every time she wants to get a refill, as they do not feel this med should be used consistently.  I have faxed all info to ins to request the grant another exception for this medication, and requested an approval for as long as possible.  The approval or denial, as well as the length of time coverage is allowed (provided it is approved) is entirely up to ins.  I called the patient back.  She is aware.

## 2014-04-10 NOTE — Telephone Encounter (Signed)
[employer] notified us they have approved our request for coverage on Acthar effective until 05/01/2014 Ref # [id number]

## 2014-04-10 NOTE — Telephone Encounter (Signed)
Patient calling to state that she is waiting for Acthar preapproval for insurance, states that the doctor only requested one month. Please return call to patient and advise.

## 2014-04-14 DIAGNOSIS — G4733 Obstructive sleep apnea (adult) (pediatric): Secondary | ICD-10-CM | POA: Diagnosis not present

## 2014-04-14 DIAGNOSIS — E785 Hyperlipidemia, unspecified: Secondary | ICD-10-CM | POA: Diagnosis not present

## 2014-04-14 DIAGNOSIS — I10 Essential (primary) hypertension: Secondary | ICD-10-CM | POA: Diagnosis not present

## 2014-04-14 DIAGNOSIS — R7301 Impaired fasting glucose: Secondary | ICD-10-CM | POA: Diagnosis not present

## 2014-04-14 DIAGNOSIS — R609 Edema, unspecified: Secondary | ICD-10-CM | POA: Diagnosis not present

## 2014-04-14 DIAGNOSIS — N3289 Other specified disorders of bladder: Secondary | ICD-10-CM | POA: Diagnosis not present

## 2014-04-14 DIAGNOSIS — K219 Gastro-esophageal reflux disease without esophagitis: Secondary | ICD-10-CM | POA: Diagnosis not present

## 2014-04-14 DIAGNOSIS — E039 Hypothyroidism, unspecified: Secondary | ICD-10-CM | POA: Diagnosis not present

## 2014-04-14 DIAGNOSIS — G35 Multiple sclerosis: Secondary | ICD-10-CM | POA: Diagnosis not present

## 2014-04-14 DIAGNOSIS — S82899A Other fracture of unspecified lower leg, initial encounter for closed fracture: Secondary | ICD-10-CM | POA: Diagnosis not present

## 2014-04-14 DIAGNOSIS — R82998 Other abnormal findings in urine: Secondary | ICD-10-CM | POA: Diagnosis not present

## 2014-04-14 DIAGNOSIS — Z1331 Encounter for screening for depression: Secondary | ICD-10-CM | POA: Diagnosis not present

## 2014-04-17 ENCOUNTER — Telehealth: Payer: Self-pay | Admitting: Pharmacist Clinician (PhC)/ Clinical Pharmacy Specialist

## 2014-04-17 NOTE — Telephone Encounter (Signed)
Pt called Thursday, LMOM, was concerned after reading information about purple toe syndrome and warfarin.  Stated she has bruised her feet several times and her toes have turned purple, wanted to know if this is the same.  Returned call Friday, assured patient that these were 2 different things, and that bruising toes is a normal side effect from warfarin.  Pt.voiced understanding.

## 2014-04-20 ENCOUNTER — Ambulatory Visit: Payer: Medicare Other | Admitting: Pharmacist Clinician (PhC)/ Clinical Pharmacy Specialist

## 2014-04-22 ENCOUNTER — Encounter: Payer: Self-pay | Admitting: Neurology

## 2014-04-23 ENCOUNTER — Telehealth: Payer: Self-pay | Admitting: Neurology

## 2014-04-23 ENCOUNTER — Encounter: Payer: Self-pay | Admitting: Neurology

## 2014-04-23 ENCOUNTER — Ambulatory Visit (INDEPENDENT_AMBULATORY_CARE_PROVIDER_SITE_OTHER): Payer: Medicare Other | Admitting: Neurology

## 2014-04-23 VITALS — BP 119/72 | HR 69 | Resp 18

## 2014-04-23 DIAGNOSIS — G729 Myopathy, unspecified: Secondary | ICD-10-CM | POA: Diagnosis not present

## 2014-04-23 DIAGNOSIS — R109 Unspecified abdominal pain: Secondary | ICD-10-CM | POA: Diagnosis not present

## 2014-04-23 DIAGNOSIS — Z9889 Other specified postprocedural states: Secondary | ICD-10-CM | POA: Diagnosis not present

## 2014-04-23 DIAGNOSIS — E662 Morbid (severe) obesity with alveolar hypoventilation: Secondary | ICD-10-CM

## 2014-04-23 DIAGNOSIS — G35 Multiple sclerosis: Secondary | ICD-10-CM

## 2014-04-23 DIAGNOSIS — G4730 Sleep apnea, unspecified: Secondary | ICD-10-CM

## 2014-04-23 DIAGNOSIS — Z978 Presence of other specified devices: Secondary | ICD-10-CM

## 2014-04-23 DIAGNOSIS — G4731 Primary central sleep apnea: Secondary | ICD-10-CM

## 2014-04-23 MED ORDER — CORTICOTROPIN 80 UNIT/ML IJ GEL
INTRAMUSCULAR | Status: DC
Start: 2014-04-23 — End: 2014-05-18

## 2014-04-23 MED ORDER — FLUTICASONE PROPIONATE 50 MCG/ACT NA SUSP: 1.0000 | Freq: Every evening | NASAL | Status: DC

## 2014-04-23 MED ORDER — CORTICOTROPIN 80 UNIT/ML IJ GEL: INTRAMUSCULAR | Status: DC

## 2014-04-23 NOTE — Addendum Note (Signed)
Addended by: [name] on: 04/23/2014 04:19 PM   Modules accepted: Orders

## 2014-04-23 NOTE — Progress Notes (Signed)
PATIENT: Toni [name] DOB: [birth year]   REASON FOR VISIT: follow up HISTORY FROM: patient  HISTORY OF PRESENT ILLNESS: Toni [name] is a 68 y.o. female Is seen here as a prolonged revisit from Dr. Dagmar Hait for MS.     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 Rosiland Oz [phone]) with Rebif, later changed under Dr. Edwena Felty guidance to copaxone and later to Betaseron and than - finally- Tysabri .  Tysabri was discontinued 2010, since 2010 she has noticed a slow rather than stepwise decline but she seems to have had at least one MS relapse per anno, that she still differentiate from her otherwise progressive disease.  Due to treatments with steroids pulse steroids as well as oral taper, she has gained a lot of weight and this induced in addition sleep apnea. She also has a neurogenic bladder and had often troubled his UTIs in the past. She is treated for her sleep apnea the CPAP and he was is this for about 6 hours nightly but download dated 02/06/2013 shows an average daily usage of 7 hours and 40 minutes at a pressure of 12 cm water and an apnea index of 8.7 she also has a rather high air leak. I would like for the patient is a high residual AHI of 21.8 to use an hour to titrate her between 6 and 15 cm water. I would generate the order today. [facility].  MS related truncal and abdominal spasms, she has multiple brain lesions on her followup MRIs, but no spinal lesions. In 2009 she begun using a baclofen pump initially with wonderful results, but unfortunately adjustments and doors and pulse doses, has not been able reciprocated the same effect on her. Baclofen pump used to be followed by Dr. Gaynell Face and is now followed by Dr. [name].  A baclofen pump check and refill was performed on Sept 27 th 2014 with Dr. [name].  Toni [name] is concerned  about the recent prescription of Acthar, as she has experienced atrial fibrillation under Solu-Medrol pulse therapy in the past and has had high blood pressures as well as high blood sugars.  The idea of the precursor hormone being used was that this would be less likely to cause hypertensive crisis or hyperglycemia. A 15 vial medication package has been delivered to Mrs. Kittler's home but the company's nurse has not been able to come by.  Toni [name] also reports that the prescription brought for intramuscular injection and not subcutaneous as is normally used for ACTH.  I change her to subcutaneous pathway for ACTHAR. She tolerates this medication very well, but she is concerned and saddend by it's price. Its a gel medication and causes some bruises, but no serious defects.  She went to [facility] October 30 2013 , after she broke her ankle in 2 places. Rehab program till June 2015 . Followed by home health. Now finished weeks ago .  She has lost weight but gained fluids. She is less able to ambulate than ever. Her left leg gives way, without warning. High fall risk. Fingers burning and tingling. Neck related?    Review of Systems:  Out of a complete 14 system review, the patient complains of only the following symptoms, and all other reviewed systems are negative.  Fever, flushing, constipation, apnea, frequent waking, difficulty emptying bladder, back pain, walking  difficulty, coordination problem, memory loss, numbness, weakness, No longer ambulatory .  ALLERGIES: Allergies  Allergen Reactions  . Oxycodone     Oral oxycodone causes vomiting  . Chlorhexidine Gluconate     Sores at site  . Zoloft [Sertraline] Hives, Swelling and Rash    HOME MEDICATIONS: Outpatient Prescriptions Prior to Visit  Medication Sig Dispense Refill  . ALPRAZolam (XANAX) 0.5 MG tablet Take 1 tablet (0.5 mg total) by mouth 2 (two) times daily as needed for anxiety.  180 tablet  1  . baclofen (LIORESAL) 20 MG tablet  Take 20 mg by mouth 3 (three) times daily as needed for muscle spasms.      Marland Kitchen buPROPion (WELLBUTRIN XL) 300 MG 24 hr tablet Take 300 mg by mouth daily.      . calcium carbonate (OS-CAL) 600 MG TABS tablet Take 600 mg by mouth 2 (two) times daily with a meal.      . cetirizine (ZYRTEC) 10 MG tablet Take 10 mg by mouth daily.      Marland Kitchen co-enzyme Q-10 30 MG capsule Take 30 mg by mouth daily.      Marland Kitchen docusate sodium (COLACE) 100 MG capsule Take 100 mg by mouth 2 (two) times daily.      . furosemide (LASIX) 40 MG tablet Take 20 mg by mouth 2 (two) times daily.      Marland Kitchen gabapentin (NEURONTIN) 300 MG capsule Take 1-2 capsules (300-600 mg total) by mouth 3 (three) times daily as needed. Pain.  540 capsule  2  . GENERLAC 10 GM/15ML SOLN 1-2 tbsp daily      . HP ACTHAR 80 UNIT/ML injectable gel Inject subcutaneously 80  units (1 ml) every day  Change needle as directed  5 mL  6  . HYDROcodone-acetaminophen (NORCO/VICODIN) 5-325 MG per tablet Take 1 tablet by mouth 2 (two) times daily as needed for moderate pain.      Marland Kitchen levothyroxine (SYNTHROID, LEVOTHROID) 75 MCG tablet Take 75 mcg by mouth daily.      . Multiple Vitamin (MULITIVITAMIN WITH MINERALS) TABS Take 1 tablet by mouth daily.      . Nebivolol HCl 20 MG TABS Take 20 mg by mouth daily.       . Omega-3 Fatty Acids (FISH OIL) 600 MG CAPS Take 1,200 mg by mouth daily.      Marland Kitchen omeprazole (PRILOSEC) 20 MG capsule Take 20 mg by mouth daily.       . ranitidine (ZANTAC) 150 MG tablet Take 150 mg by mouth at bedtime.       . simvastatin (ZOCOR) 20 MG tablet Take 20 mg by mouth at bedtime.       . Tamsulosin HCl (FLOMAX) 0.4 MG CAPS Take 0.4 mg by mouth daily.      Marland Kitchen warfarin (COUMADIN) 5 MG tablet Take 1 tablet (5 mg total) by mouth daily.  90 tablet  3  . Wheat Dextrin (BENEFIBER PO) Take 1 packet by mouth 2 (two) times daily.      Marland Kitchen lactulose (CHRONULAC) 10 GM/15ML solution Take 10 g by mouth 2 (two) times daily.       No facility-administered medications  prior to visit.   PHYSICAL EXAM  Filed Vitals:   04/23/14 1529  BP: 119/72  Pulse: 69  Resp: 18   Height ;  5.4 ", 260 pounds  General: The patient is awake, alert and appears not in acute distress. The patient is well groomed.  Head: Normocephalic, atraumatic. Neck is supple.  Mallampati 4 neck circumference:19 i nches .  Cardiovascular: Regular rate and rhythm today , without murmurs or carotid bruit, and without distended neck veins.  Respiratory: Lungs are clear to auscultation.  Skin: evidence of edema, not Rash, Injection site reaction.  Trunk: BMI is elevated. Neurologic exam :  The patient is awake and alert, oriented to place and time. Memory subjective described as intact. There is a normal attention span & concentration ability.  Speech without dysarthria, dysphonia , delayed wordfinding- aphasia.  Mood and affect are appropriate.  Cranial nerves:  Pupils are equal and briskly reactive to light. Extraocular movements in vertical and horizontal planes intact and without nystagmus.  Visual fields by finger perimetry are intact.  Hearing to finger rub intact. Facial sensation intact to fine touch. Facial motor strength is symmetric and tongue and uvula move midline.  Motor exam: Patient reports that she is losing dexterity in her right hand feels clumsy and also numbish. She reports a gloved feeling. She reports trouble with spitting up fine objects like picking lint or picking up a pill.  Decreased strength in all extremities.  Sensory: Fine touch, pinprick and vibration were restricted in the right, dominant hand, both feet Coordination: He reports changing in her handwriting and trouble holding a pencil in her right him a dominant hand. Rapid alternating movements in the fingers/hands is slow.  Gait and station wheelchair.  Deep tendon reflexes: in the upper and lower extremities are attenuated, right babinski positive- up going.   ASSESSMENT AND PLAN  68 y.o. year old  female  has a past medical history of Multiple sclerosis; Hypertension; PAF (paroxysmal atrial fibrillation); Thyroid disease; obesity hypoventilation,  Pneumonia; Sleep apnea; Hyperthyroidism; Edema; Depression; Anxiety; Headache(784.0); Chronic pain; echocardiogram (04/22/10); and History of stress test (06/21/10) here with for routine followup of Primary Progressive MS.  PLAN :    Continue ACTHAR 5 days a month.  every 25 days .  She is very edematous - incactivity, CHF? , Edema in both legs and abdomen. On lasix  60 mg -  Polyuria , which confines her to the home.  At [facility] she was  followed by Dr. Felipa Eth during her recent stay .  Pre=authoization for the next month needed - September 2015. Forwarded to Royetta Asal.  Continue Baclofen PUMP with Dr [name]. Marland Kitchen  Keep next follow up appointment with me and Charlott Holler in  4 months.  Meds ordered this encounter  Medications  . fluticasone (FLONASE) 50 MCG/ACT nasal spray    Sig: As needed  . Cholecalciferol (VITAMIN D) 1000 UNITS capsule    Sig: Take 1,000 Units by mouth daily. 2 capsules     04/23/2014, 3:47 PM [facility] [address] [place], [place] [address] [phone]  Note: This document was prepared with digital dictation and possible smart phrase technology. Any transcriptional errors that result from this process are unintentional.

## 2014-04-23 NOTE — Telephone Encounter (Signed)
Patient needs an appointment to refill her pump--please call--thank you.

## 2014-04-23 NOTE — Telephone Encounter (Signed)
Please contact [employer] to change settings ASAP.  She will see LYNN in December , needs to bring download or better, machine to appointments.

## 2014-04-23 NOTE — Patient Instructions (Addendum)
Acthar to be refilled, she needs pre-authorization for the month of September . EMG and NCS with Dr [name].  BRING YOUR CPAP to my visits.  Continue PT.  Sleep Apnea  Sleep apnea is a sleep disorder characterized by abnormal pauses in breathing while you sleep. When your breathing pauses, the level of oxygen in your blood decreases. This causes you to move out of deep sleep and into light sleep. As a result, your quality of sleep is poor, and the system that carries your blood throughout your body (cardiovascular system) experiences stress. If sleep apnea remains untreated, the following conditions can develop:  High blood pressure (hypertension).  Coronary artery disease.  Inability to achieve or maintain an erection (impotence).  Impairment of your thought process (cognitive dysfunction). There are three types of sleep apnea: 1. Obstructive sleep apnea--Pauses in breathing during sleep because of a blocked airway. 2. Central sleep apnea--Pauses in breathing during sleep because the area of the brain that controls your breathing does not send the correct signals to the muscles that control breathing. 3. Mixed sleep apnea--A combination of both obstructive and central sleep apnea. RISK FACTORS The following risk factors can increase your risk of developing sleep apnea:  Being overweight.  Smoking.  Having narrow passages in your nose and throat.  Being of older age.  Being female.  Alcohol use.  Sedative and tranquilizer use.  Ethnicity. Among individuals younger than 35 years, African Americans are at increased risk of sleep apnea. SYMPTOMS   Difficulty staying asleep.  Daytime sleepiness and fatigue.  Loss of energy.  Irritability.  Loud, heavy snoring.  Morning headaches.  Trouble concentrating.  Forgetfulness.  Decreased interest in sex. DIAGNOSIS  In order to diagnose sleep apnea, your caregiver will perform a physical examination. Your caregiver may  suggest that you take a home sleep test. Your caregiver may also recommend that you spend the night in a sleep lab. In the sleep lab, several monitors record information about your heart, lungs, and brain while you sleep. Your leg and arm movements and blood oxygen level are also recorded. TREATMENT The following actions may help to resolve mild sleep apnea:  Sleeping on your side.   Using a decongestant if you have nasal congestion.   Avoiding the use of depressants, including alcohol, sedatives, and narcotics.   Losing weight and modifying your diet if you are overweight. There also are devices and treatments to help open your airway:  Oral appliances. These are custom-made mouthpieces that shift your lower jaw forward and slightly open your bite. This opens your airway.  Devices that create positive airway pressure. This positive pressure "splints" your airway open to help you breathe better during sleep. The following devices create positive airway pressure:  Continuous positive airway pressure (CPAP) device. The CPAP device creates a continuous level of air pressure with an air pump. The air is delivered to your airway through a mask while you sleep. This continuous pressure keeps your airway open.  Nasal expiratory positive airway pressure (EPAP) device. The EPAP device creates positive air pressure as you exhale. The device consists of single-use valves, which are inserted into each nostril and held in place by adhesive. The valves create very little resistance when you inhale but create much more resistance when you exhale. That increased resistance creates the positive airway pressure. This positive pressure while you exhale keeps your airway open, making it easier to breath when you inhale again.  Bilevel positive airway pressure (BPAP) device. The BPAP  device is used mainly in patients with central sleep apnea. This device is similar to the CPAP device because it also uses an air  pump to deliver continuous air pressure through a mask. However, with the BPAP machine, the pressure is set at two different levels. The pressure when you exhale is lower than the pressure when you inhale.  Surgery. Typically, surgery is only done if you cannot comply with less invasive treatments or if the less invasive treatments do not improve your condition. Surgery involves removing excess tissue in your airway to create a wider passage way. Document Released: 07/28/2002 Document Revised: 12/02/2012 Document Reviewed: 12/14/2011 [facility] Patient Information 2015 [place], [place]. This information is not intended to replace advice given to you by your health care provider. Make sure you discuss any questions you have with your health care provider.

## 2014-04-24 ENCOUNTER — Ambulatory Visit (INDEPENDENT_AMBULATORY_CARE_PROVIDER_SITE_OTHER): Payer: Medicare Other | Admitting: Pharmacist Clinician (PhC)/ Clinical Pharmacy Specialist

## 2014-04-24 VITALS — BP 130/74 | HR 72

## 2014-04-24 DIAGNOSIS — Z7901 Long term (current) use of anticoagulants: Secondary | ICD-10-CM

## 2014-04-24 DIAGNOSIS — I4891 Unspecified atrial fibrillation: Secondary | ICD-10-CM | POA: Diagnosis not present

## 2014-04-24 LAB — POCT INR: INR: 1.6

## 2014-04-24 NOTE — Telephone Encounter (Signed)
Dr. Brett Fairy wants to know if Mrs. Theilen is able to get EMG/NCV. It has been scheduled for 9/15 patient is on Warfarin 5 mg?

## 2014-04-24 NOTE — Telephone Encounter (Signed)
Being on Coumadin and is not a contraindication to having an EMG study.

## 2014-04-27 ENCOUNTER — Telehealth: Payer: Self-pay

## 2014-04-27 NOTE — Telephone Encounter (Signed)
[employer] notified us they have approved our request for coverage on Acthar effective until 05/18/2014 Ref # [id number]

## 2014-04-30 ENCOUNTER — Telehealth: Payer: Self-pay | Admitting: Neurology

## 2014-04-30 NOTE — Telephone Encounter (Signed)
i ordered EMG and NCS.

## 2014-04-30 NOTE — Telephone Encounter (Signed)
Message sent to Towana Badger for scheduling.

## 2014-04-30 NOTE — Telephone Encounter (Signed)
Patient checking status of CT Scan appointment.  Please call anytime and may leave detailed message on voicemail.

## 2014-05-05 ENCOUNTER — Ambulatory Visit (INDEPENDENT_AMBULATORY_CARE_PROVIDER_SITE_OTHER): Payer: Medicare Other | Admitting: Neurology

## 2014-05-05 ENCOUNTER — Encounter (INDEPENDENT_AMBULATORY_CARE_PROVIDER_SITE_OTHER): Payer: Self-pay

## 2014-05-05 DIAGNOSIS — G729 Myopathy, unspecified: Secondary | ICD-10-CM

## 2014-05-05 DIAGNOSIS — Z978 Presence of other specified devices: Secondary | ICD-10-CM

## 2014-05-05 DIAGNOSIS — G35 Multiple sclerosis: Secondary | ICD-10-CM

## 2014-05-05 DIAGNOSIS — R109 Unspecified abdominal pain: Secondary | ICD-10-CM

## 2014-05-05 DIAGNOSIS — G5603 Carpal tunnel syndrome, bilateral upper limbs: Secondary | ICD-10-CM

## 2014-05-05 DIAGNOSIS — G5600 Carpal tunnel syndrome, unspecified upper limb: Secondary | ICD-10-CM

## 2014-05-05 DIAGNOSIS — Z0289 Encounter for other administrative examinations: Secondary | ICD-10-CM

## 2014-05-05 NOTE — Procedures (Signed)
     HISTORY:  Toni [name] is a 68 year old patient with a history of multiple sclerosis, obesity, and a significant gait disorder. The patient has had some issues with numbness and tingling in the hands bilaterally, left greater than right since April 2015. The patient has a history of prior cervical spine surgery. The patient is being evaluated for a possible neuropathy or a cervical radiculopathy.  NERVE CONDUCTION STUDIES:  Nerve conduction studies were performed on both upper extremities. The distal motor latencies for the median nerves were prolonged bilaterally, with normal motor amplitudes for these nerves bilaterally. The distal motor latencies and motor amplitudes for the ulnar nerves were normal bilaterally. The F wave latencies for the median nerves were prolonged bilaterally, normal for the ulnar nerves bilaterally. Nerve conduction velocities for the median and ulnar nerves were normal bilaterally. The sensory latencies for the median nerves were prolonged bilaterally, normal for the ulnar nerves bilaterally.  Nerve conduction studies were performed on the left lower extremity. The distal motor latencies and motor amplitudes for the peroneal and posterior tibial nerves were within normal limits. The nerve conduction velocities for these nerves were also normal. The H reflex latency was normal. The sensory latency for the peroneal nerve was within normal limits.   EMG STUDIES:  EMG study was performed on the left upper extremity:  The first dorsal interosseous muscle reveals 2 to 4 K units with full recruitment. No fibrillations or positive waves were noted. The abductor pollicis brevis muscle reveals 2 to 4 K units with full recruitment. No fibrillations or positive waves were noted. The extensor indicis proprius muscle reveals 1 to 3 K units with full recruitment. No fibrillations or positive waves were noted. The pronator teres muscle reveals 2 to 3 K units with full  recruitment. No fibrillations or positive waves were noted. The biceps muscle reveals 1 to 2 K units with full recruitment. No fibrillations or positive waves were noted. The triceps muscle reveals 2 to 4 K units with full recruitment. No fibrillations or positive waves were noted. The anterior deltoid muscle reveals 2 to 3 K units with full recruitment. No fibrillations or positive waves were noted. The cervical paraspinal muscles were tested at 2 levels. No abnormalities of insertional activity were seen at either level tested, with the exception that there was 1 isolated run of positive waves at the upper level. There was good relaxation.   IMPRESSION:  Nerve conduction studies done on both upper extremities and the left lower extremity shows no clear evidence of an overlying peripheral neuropathy. 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 EMG evaluation of the left upper extremity shows no clear evidence of an overlying cervical radiculopathy. One isolated run of positive waves were seen in the upper cervical paraspinal muscles, but this finding is of unclear clinical significance.  Jill Alexanders MD 05/05/2014 11:10 AM  [facility] [address] [place], [place] [address]  Phone [phone] Fax [phone]

## 2014-05-06 ENCOUNTER — Ambulatory Visit (INDEPENDENT_AMBULATORY_CARE_PROVIDER_SITE_OTHER): Payer: Medicare Other | Admitting: Pharmacist Clinician (PhC)/ Clinical Pharmacy Specialist

## 2014-05-06 DIAGNOSIS — Z7901 Long term (current) use of anticoagulants: Secondary | ICD-10-CM

## 2014-05-06 DIAGNOSIS — I4891 Unspecified atrial fibrillation: Secondary | ICD-10-CM

## 2014-05-06 LAB — POCT INR: INR: 3

## 2014-05-08 ENCOUNTER — Ambulatory Visit: Payer: Medicare Other | Admitting: Pharmacist Clinician (PhC)/ Clinical Pharmacy Specialist

## 2014-05-11 ENCOUNTER — Ambulatory Visit
Admission: RE | Admit: 2014-05-11 | Discharge: 2014-05-11 | Disposition: A | Discharge status: Home / Self Care | Payer: Medicare Other | Source: Ambulatory Visit | Attending: Neurology | Admitting: Neurology

## 2014-05-11 DIAGNOSIS — G35 Multiple sclerosis: Secondary | ICD-10-CM

## 2014-05-11 DIAGNOSIS — Z978 Presence of other specified devices: Secondary | ICD-10-CM

## 2014-05-11 DIAGNOSIS — R109 Unspecified abdominal pain: Secondary | ICD-10-CM

## 2014-05-11 DIAGNOSIS — G729 Myopathy, unspecified: Secondary | ICD-10-CM

## 2014-05-11 DIAGNOSIS — R198 Other specified symptoms and signs involving the digestive system and abdomen: Secondary | ICD-10-CM | POA: Diagnosis not present

## 2014-05-11 DIAGNOSIS — N289 Disorder of kidney and ureter, unspecified: Secondary | ICD-10-CM | POA: Diagnosis not present

## 2014-05-11 NOTE — Telephone Encounter (Signed)
Was unable to leave a message for the patient.  Will try back later.

## 2014-05-12 NOTE — Telephone Encounter (Signed)
Close visit note, please.

## 2014-05-13 ENCOUNTER — Telehealth: Payer: Self-pay | Admitting: *Deleted

## 2014-05-13 NOTE — Telephone Encounter (Signed)
Patient was given her results and is requesting that Dr. Brett Fairy calls her back personally to discuss.

## 2014-05-13 NOTE — Telephone Encounter (Signed)
Patient was calling for the results of her CT with pelvis, that was done on 05/11/14 and results are in the chart.  Please advise and call patient back with the results.

## 2014-05-13 NOTE — Telephone Encounter (Signed)
Toni [name],  No ascitis, normal pelvic floor, printed reports for mail to send to patient. CD

## 2014-05-14 NOTE — Telephone Encounter (Signed)
Dr. Brett Fairy did speak to the patient personally and the report was routed to Dr. Dagmar Hait.

## 2014-05-18 ENCOUNTER — Ambulatory Visit (INDEPENDENT_AMBULATORY_CARE_PROVIDER_SITE_OTHER): Payer: Medicare Other | Admitting: Pharmacist Clinician (PhC)/ Clinical Pharmacy Specialist

## 2014-05-18 ENCOUNTER — Ambulatory Visit (INDEPENDENT_AMBULATORY_CARE_PROVIDER_SITE_OTHER): Payer: Medicare Other | Admitting: Internal Medicine

## 2014-05-18 ENCOUNTER — Encounter: Payer: Self-pay | Admitting: Internal Medicine

## 2014-05-18 VITALS — BP 127/74 | HR 78 | Ht 63.0 in | Wt 265.0 lb

## 2014-05-18 DIAGNOSIS — I4891 Unspecified atrial fibrillation: Secondary | ICD-10-CM

## 2014-05-18 DIAGNOSIS — G35 Multiple sclerosis: Secondary | ICD-10-CM

## 2014-05-18 DIAGNOSIS — E785 Hyperlipidemia, unspecified: Secondary | ICD-10-CM | POA: Diagnosis not present

## 2014-05-18 DIAGNOSIS — I10 Essential (primary) hypertension: Secondary | ICD-10-CM

## 2014-05-18 DIAGNOSIS — Z7901 Long term (current) use of anticoagulants: Secondary | ICD-10-CM | POA: Diagnosis not present

## 2014-05-18 DIAGNOSIS — I480 Paroxysmal atrial fibrillation: Secondary | ICD-10-CM

## 2014-05-18 LAB — POCT INR: INR: 2.2

## 2014-05-18 NOTE — Progress Notes (Signed)
OFFICE NOTE  Chief Complaint:  Routine follow-up  Primary Care Physician: [name], MD  HPI:  Toni [name] is a 68 year old female with multiple sclerosis, paroxysmal atrial fibrillation, on Coumadin, morbid obesity, obstructive sleep apnea, and dyslipidemia. She has had worsening swelling in her legs, thought to be due to an MS flare. She has a history of varicose veins with severe venous insufficiency in both the right and left greater saphenous veins. The right greater saphenous vein was actually successfully ablated by my partner, Dr. Elisabeth Cara, about 1 to 2 years ago. She does have residual reflux in the left greater saphenous vein extending from the proximal calf up to the saphenofemoral junction. The vein measured between 4 and 8 mm with between 1 and 6 seconds of reflux. We had discussed ablation of that before, but she has canceled several times at procedures. At this point, I think there is little benefit to continuing to try for her to undergo ablation. 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 She can wear work showed a compression stockings and this does seem to help her. With regards to her atrial fibrillation she has not had significant recurrence to her knowledge. She continues on warfarin and has been therapeutic, with her INR of 1.9 in the office by fingerstick today.  [name] returns today for followup. She reports her MS is somewhat progressive. She continues to have some enlargement of her legs but no significant swelling. There is a mild varicosities in the right lower extremity. She is unaware of her atrial fibrillation. INR today was 2.3.  PMHx:  Past Medical History  Diagnosis Date  . Multiple sclerosis     has had this may 1989-[facility] reg doc  . Hypertension   . PAF (paroxysmal atrial fibrillation)     on coumadin; documented on monitor 06/2010  . Thyroid disease   . HLD (hyperlipidemia)   . Shortness  of breath   . Pneumonia   . Sleep apnea   . Hyperthyroidism   . Edema     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' ablation of R GSV 2012  . Depression   . Anxiety   . Headache(784.0)     MIGRAINE   . Chronic pain   . Hx of echocardiogram 04/22/10    EF 55-60%, no valve issues  . History of stress test 06/21/10    limited exam with some degree of breast attenuatin of the apex however a component of apical ischemia is not excluded, EF 62%; cardiac cath     Past Surgical History  Procedure Laterality Date  . Cervical fusion      with correction-june 2005  . Infusion pump implantation      baclofen infusion in lower abd  . Venous ablation  12/16/10    radiofreq ablation -Dr Elisabeth Cara and [facility]  . Child births      X67  . Cardiac catheterization  06/22/10    normal coronary arteries, PAF    FAMHx:  Family History  Problem Relation Age of Onset  . Coronary artery disease Father     at age 9  . Coronary artery disease Maternal Grandmother   . Depression Maternal Grandmother   . Cancer Paternal Grandmother   . Depression Mother   . Alcoholism Son     SOCHx:   reports that she quit smoking about 37 years ago. She has never used smokeless tobacco. She  reports that she drinks alcohol. She reports that she does not use illicit drugs.  ALLERGIES:  Allergies  Allergen Reactions  . Oxycodone     Oral oxycodone causes vomiting  . Chlorhexidine Gluconate     Sores at site  . Zoloft [Sertraline] Hives, Swelling and Rash    ROS: A comprehensive review of systems was negative except for: Constitutional: positive for fatigue Respiratory: positive for dyspnea on exertion Cardiovascular: positive for lower extremity edema and palpitations Neurological: positive for neuropathy  HOME MEDS: Current Outpatient Prescriptions  Medication Sig Dispense Refill  . ALPRAZolam (XANAX) 0.5 MG tablet Take 1 tablet (0.5 mg total) by mouth 2 (two) times daily as needed for  anxiety.  180 tablet  1  . baclofen (LIORESAL) 20 MG tablet Take 20 mg by mouth 3 (three) times daily as needed for muscle spasms.      Marland Kitchen buPROPion (WELLBUTRIN XL) 300 MG 24 hr tablet Take 300 mg by mouth daily.      . calcium carbonate (OS-CAL) 600 MG TABS tablet Take 600 mg by mouth 2 (two) times daily with a meal.      . cephALEXin (KEFLEX) 250 MG capsule Take 1 capsule by mouth daily.      . cetirizine (ZYRTEC) 10 MG tablet Take 10 mg by mouth daily.      . Cholecalciferol (VITAMIN D) 1000 UNITS capsule Take 1,000 Units by mouth daily. 2 capsules      . co-enzyme Q-10 30 MG capsule Take 30 mg by mouth daily.      . corticotropin (ATHACAR H.P.) 80 UNIT/ML injectable gel 5 injection for 5 Days in a row every monthly from October on, 2015 .      . docusate sodium (COLACE) 100 MG capsule Take 100 mg by mouth 2 (two) times daily.      . fluticasone (FLONASE) 50 MCG/ACT nasal spray Place 1 spray into both nostrils Nightly.  1 g  2  . furosemide (LASIX) 40 MG tablet Take 20 mg by mouth 2 (two) times daily.      Marland Kitchen gabapentin (NEURONTIN) 300 MG capsule Take 1-2 capsules (300-600 mg total) by mouth 3 (three) times daily as needed. Pain.  540 capsule  2  . GENERLAC 10 GM/15ML SOLN 1-2 tbsp daily      . HYDROcodone-acetaminophen (NORCO/VICODIN) 5-325 MG per tablet Take 1 tablet by mouth 2 (two) times daily as needed for moderate pain.      Marland Kitchen levothyroxine (SYNTHROID, LEVOTHROID) 75 MCG tablet Take 75 mcg by mouth daily.      . Multiple Vitamin (MULITIVITAMIN WITH MINERALS) TABS Take 1 tablet by mouth daily.      . Nebivolol HCl 20 MG TABS Take 20 mg by mouth daily.       . Omega-3 Fatty Acids (FISH OIL) 600 MG CAPS Take 1,200 mg by mouth daily.      Marland Kitchen omeprazole (PRILOSEC) 20 MG capsule Take 20 mg by mouth daily.       . ranitidine (ZANTAC) 150 MG tablet Take 150 mg by mouth at bedtime.       . simvastatin (ZOCOR) 20 MG tablet Take 20 mg by mouth at bedtime.       . Tamsulosin HCl (FLOMAX) 0.4 MG CAPS  Take 0.4 mg by mouth daily.      Marland Kitchen warfarin (COUMADIN) 5 MG tablet Take 1 tablet (5 mg total) by mouth daily.  90 tablet  3  . Wheat Dextrin (BENEFIBER PO) Take 1  packet by mouth 2 (two) times daily.       No current facility-administered medications for this visit.    LABS/IMAGING: No results found for this or any previous visit (from the past 48 hour(s)). No results found.  VITALS: BP 127/74  Pulse 78  Ht 5\' 3"  (1.6 m)  Wt 265 lb (120.203 kg)  BMI 46.95 kg/m2  SpO2 97%  EXAM: General appearance: alert and no distress Lungs: clear to auscultation bilaterally Heart: regular rate and rhythm, S1, S2 normal, no murmur, click, rub or gallop Abdomen: soft, non-tender; bowel sounds normal; no masses,  no organomegaly and morbidly obese Extremities: edema trace bilateral and varicose veins noted Pulses: 2+ and symmetric  EKG: Normal sinus rhythm at 78  ASSESSMENT: 1. Paroxysmal atrial fibrillation 2. Morbid obesity 3. Obstructive sleep apnea on CPAP 4. Multiple sclerosis 5. Chronic anticoagulation on warfarin - INR 2.3 today 6. Chronic venous insufficiency 7. Hypertension-controlled  PLAN: 1.   Mrs. Hashimi is doing well from an A. fib standpoint. She is in sinus rhythm today and is unaware of when she goes in and out. Unfortunately she continues to have progressive multiple sclerosis. She does have chronic venous reflux without any further treatment options. INR was 2.3 today. We will continue current medications and plan to see her back annually or sooner as necessary.  Pixie Casino, MD, [facility] Attending Cardiologist [facility]  HILTY,Kenneth C 05/18/2014, 10:49 AM

## 2014-05-18 NOTE — Patient Instructions (Signed)
Your physician wants you to follow-up in: 1 year with Dr. Hilty. You will receive a reminder letter in the mail two months in advance. If you don't receive a letter, please call our office to schedule the follow-up appointment.  

## 2014-05-19 ENCOUNTER — Encounter: Payer: Self-pay | Admitting: Neurology

## 2014-05-19 ENCOUNTER — Ambulatory Visit (INDEPENDENT_AMBULATORY_CARE_PROVIDER_SITE_OTHER): Payer: Medicare Other | Admitting: Neurology

## 2014-05-19 VITALS — BP 118/82 | HR 72

## 2014-05-19 DIAGNOSIS — G35 Multiple sclerosis: Secondary | ICD-10-CM

## 2014-05-19 NOTE — Procedures (Signed)
     History:  Toni [name] is a 67 year old patient with a history of multiple sclerosis with spasticity of both lower extremities. The patient has a baclofen pump in place, and she comes in for a baclofen pump refill. She reports some increased spasticity in the legs, requiring oral supplementation of baclofen. She is doing well at nighttime, the spasticity is heightened during the daytime.   Baclofen pump refill note  The baclofen pump site was cleaned with Betadine solution. A 21-gauge needle was inserted into the pump port site. Approximately 7 cc of residual baclofen was removed. 40 cc of replacement baclofen was placed into the pump at 2000 mcg/cc concentration.  The pump was reprogrammed for the following settings: The basal rate was increased from 36.0 mcg per hour to 37.0 mcg per hour, a 2% increase. The right from 1 AM to 5 AM was unchanged, at 25 mcg per hour or a total dose of 124.9 mcg during this period of time. The total daily dose of baclofen is a 827.4 mcg per day.  The alarm volume is set at 1.5 cc. The next alarm date is 08/20/14.  The patient tolerated the procedure well. There were no complications of the above procedure.   Of note is that the indicated reservoir volume was 3.3 cc, 7 cc of fluid were actually recovered. The baclofen pump has been in place for almost 7 years. The patient notes a worsening of spasticity suddenly, the possible need to be reevaluated, as the patient may require a pump replacement soon.

## 2014-06-08 ENCOUNTER — Telehealth: Payer: Self-pay | Admitting: Neurology

## 2014-06-08 NOTE — Telephone Encounter (Signed)
Ins has been contacted.  I called patient back, got no answer.  Left message.

## 2014-06-08 NOTE — Telephone Encounter (Signed)
Patient stated authorization is needed for Rx ACTHAR.  Please call and advise

## 2014-06-14 ENCOUNTER — Telehealth: Payer: Self-pay

## 2014-06-14 NOTE — Telephone Encounter (Signed)
[employer] notified us they have approved our request for coverage on Acthar effective until 06/29/2014 Ref # [id number]

## 2014-06-15 ENCOUNTER — Ambulatory Visit: Payer: Medicare Other | Admitting: Pharmacist Clinician (PhC)/ Clinical Pharmacy Specialist

## 2014-06-19 ENCOUNTER — Ambulatory Visit (INDEPENDENT_AMBULATORY_CARE_PROVIDER_SITE_OTHER): Payer: Medicare Other | Admitting: Pharmacist Clinician (PhC)/ Clinical Pharmacy Specialist

## 2014-06-19 ENCOUNTER — Telehealth: Payer: Self-pay | Admitting: Neurology

## 2014-06-19 DIAGNOSIS — Z7901 Long term (current) use of anticoagulants: Secondary | ICD-10-CM | POA: Diagnosis not present

## 2014-06-19 DIAGNOSIS — I4891 Unspecified atrial fibrillation: Secondary | ICD-10-CM

## 2014-06-19 NOTE — Telephone Encounter (Signed)
I called the patient. The patient feels somewhat wobbly on the legs, she feels that the baclofen pump may need to be cut back. She is to come in sometime Monday afternoon and I will make the adjustment.

## 2014-06-19 NOTE — Telephone Encounter (Signed)
Please advise if I should schedule patient. Thanks

## 2014-06-19 NOTE — Telephone Encounter (Signed)
Patient requesting to come in on Monday in order to get her Baclofen pump adjusted, states that she needs less medicine, please return call and advise.

## 2014-06-22 ENCOUNTER — Telehealth: Payer: Self-pay | Admitting: Neurology

## 2014-06-22 DIAGNOSIS — R300 Dysuria: Secondary | ICD-10-CM | POA: Diagnosis not present

## 2014-06-22 DIAGNOSIS — R8299 Other abnormal findings in urine: Secondary | ICD-10-CM | POA: Diagnosis not present

## 2014-06-22 DIAGNOSIS — R8290 Unspecified abnormal findings in urine: Secondary | ICD-10-CM | POA: Diagnosis not present

## 2014-06-22 NOTE — Telephone Encounter (Signed)
Patient called and has a UTI, she will call back to get her baclofen pump adjusted.

## 2014-06-25 ENCOUNTER — Ambulatory Visit: Payer: Medicare Other

## 2014-07-02 ENCOUNTER — Encounter: Payer: Self-pay | Admitting: Neurology

## 2014-07-07 DIAGNOSIS — L814 Other melanin hyperpigmentation: Secondary | ICD-10-CM | POA: Diagnosis not present

## 2014-07-07 DIAGNOSIS — D225 Melanocytic nevi of trunk: Secondary | ICD-10-CM | POA: Diagnosis not present

## 2014-07-07 DIAGNOSIS — D485 Neoplasm of uncertain behavior of skin: Secondary | ICD-10-CM | POA: Diagnosis not present

## 2014-07-07 DIAGNOSIS — L821 Other seborrheic keratosis: Secondary | ICD-10-CM | POA: Diagnosis not present

## 2014-07-08 ENCOUNTER — Encounter: Payer: Self-pay | Admitting: Neurology

## 2014-07-08 DIAGNOSIS — Z961 Presence of intraocular lens: Secondary | ICD-10-CM | POA: Diagnosis not present

## 2014-07-08 DIAGNOSIS — H04123 Dry eye syndrome of bilateral lacrimal glands: Secondary | ICD-10-CM | POA: Diagnosis not present

## 2014-07-09 ENCOUNTER — Telehealth: Payer: Self-pay | Admitting: Neurology

## 2014-07-09 NOTE — Telephone Encounter (Signed)
Patient is calling to get pre-authorization for Rx Acthar from [facility].Thank you.

## 2014-07-09 NOTE — Telephone Encounter (Signed)
All clinical info has been forwarded to ins.  Request is currently pending.  They will notify patient of outcome once decision has been made.

## 2014-07-14 ENCOUNTER — Encounter: Payer: Self-pay | Admitting: Neurology

## 2014-07-21 ENCOUNTER — Telehealth: Payer: Self-pay | Admitting: Pharmacist Clinician (PhC)/ Clinical Pharmacy Specialist

## 2014-07-22 ENCOUNTER — Telehealth: Payer: Self-pay | Admitting: Neurology

## 2014-07-22 ENCOUNTER — Encounter: Payer: Self-pay | Admitting: Neurology

## 2014-07-22 ENCOUNTER — Ambulatory Visit (INDEPENDENT_AMBULATORY_CARE_PROVIDER_SITE_OTHER): Payer: Medicare Other | Admitting: Neurology

## 2014-07-22 DIAGNOSIS — I10 Essential (primary) hypertension: Secondary | ICD-10-CM | POA: Diagnosis not present

## 2014-07-22 DIAGNOSIS — I480 Paroxysmal atrial fibrillation: Secondary | ICD-10-CM | POA: Diagnosis not present

## 2014-07-22 DIAGNOSIS — M62838 Other muscle spasm: Secondary | ICD-10-CM | POA: Diagnosis not present

## 2014-07-22 DIAGNOSIS — R609 Edema, unspecified: Secondary | ICD-10-CM | POA: Diagnosis not present

## 2014-07-22 DIAGNOSIS — G35 Multiple sclerosis: Secondary | ICD-10-CM | POA: Diagnosis not present

## 2014-07-22 DIAGNOSIS — Z6841 Body Mass Index (BMI) 40.0 and over, adult: Secondary | ICD-10-CM | POA: Diagnosis not present

## 2014-07-22 DIAGNOSIS — Z23 Encounter for immunization: Secondary | ICD-10-CM | POA: Diagnosis not present

## 2014-07-22 MED ORDER — BACLOFEN 20 MG PO TABS: 20.0000 mg | ORAL_TABLET | Freq: Three times a day (TID) | ORAL | Status: DC | PRN

## 2014-07-22 NOTE — Telephone Encounter (Signed)
Patient stated tightening of Baclofen Pump has gotten worse.  Please call and advise

## 2014-07-22 NOTE — Telephone Encounter (Signed)
I called the patient. The patient indicates that she has heard an alarm from the baclofen pump, and now her spasticity is worsening. She will come in today so that I can interrogate the pump.

## 2014-07-22 NOTE — Progress Notes (Signed)
Toni [name] is a 68 year old patient with a history of multiple sclerosis. The patient has a baclofen pump in place. She heard a tone alarm yesterday, and again today. She has noted an increase in spasticity within the last 24 hours, she has started oral baclofen at this time. She comes in for a baclofen pump interrogation.  The baclofen pump was interrogated, indicates that the pump is failing, recommends pump replacement by the 26th of February 2016.  Since June 2015, there has been discrepancy in the amount of baclofen removed and what the pump indicated was in the reservoir. Pump itself may be failing as well.  We will make a referral to Dr. Vertell Limber for a pump replacement. I have discussed this with the [employer] representative.

## 2014-07-22 NOTE — Telephone Encounter (Signed)
Spoke to someone at patient's home and she relayed that they think the beeping could be from the pump, and she has had it for 7 years.  The patient was getting ready to go to an appointment.

## 2014-07-22 NOTE — Telephone Encounter (Addendum)
I have let the front end know that the patient will be coming in after 4pm.

## 2014-07-22 NOTE — Telephone Encounter (Signed)
Patient stated Baclofen Pump is beeping.  Please call and advise.

## 2014-07-22 NOTE — Telephone Encounter (Signed)
Closed encounter °

## 2014-07-22 NOTE — Telephone Encounter (Signed)
Patient called back and stated to disregard previous message.

## 2014-07-23 ENCOUNTER — Other Ambulatory Visit: Payer: Self-pay | Admitting: Neurosurgery

## 2014-07-24 ENCOUNTER — Ambulatory Visit: Payer: Medicare Other | Admitting: Pharmacist Clinician (PhC)/ Clinical Pharmacy Specialist

## 2014-07-24 ENCOUNTER — Telehealth: Payer: Self-pay | Admitting: *Deleted

## 2014-07-24 NOTE — Telephone Encounter (Signed)
Faxed clearance for LOW RISK surgery - baclofen pump replacement (on 07/28/14) to Dr. Erline Levine If needed, can hold warfarin 5 days prior

## 2014-07-27 ENCOUNTER — Encounter (HOSPITAL_COMMUNITY): Payer: Self-pay

## 2014-07-27 ENCOUNTER — Encounter (HOSPITAL_COMMUNITY)
Admission: RE | Admit: 2014-07-27 | Discharge: 2014-07-27 | Disposition: A | Discharge status: Home / Self Care | Payer: Medicare Other | Source: Ambulatory Visit | Attending: Neurosurgery | Admitting: Neurosurgery

## 2014-07-27 DIAGNOSIS — I10 Essential (primary) hypertension: Secondary | ICD-10-CM | POA: Diagnosis not present

## 2014-07-27 DIAGNOSIS — E039 Hypothyroidism, unspecified: Secondary | ICD-10-CM | POA: Diagnosis present

## 2014-07-27 DIAGNOSIS — Z7901 Long term (current) use of anticoagulants: Secondary | ICD-10-CM | POA: Diagnosis not present

## 2014-07-27 DIAGNOSIS — E785 Hyperlipidemia, unspecified: Secondary | ICD-10-CM | POA: Diagnosis present

## 2014-07-27 DIAGNOSIS — E0590 Thyrotoxicosis, unspecified without thyrotoxic crisis or storm: Secondary | ICD-10-CM | POA: Diagnosis present

## 2014-07-27 DIAGNOSIS — G43909 Migraine, unspecified, not intractable, without status migrainosus: Secondary | ICD-10-CM | POA: Diagnosis present

## 2014-07-27 DIAGNOSIS — Z885 Allergy status to narcotic agent status: Secondary | ICD-10-CM | POA: Diagnosis not present

## 2014-07-27 DIAGNOSIS — G4730 Sleep apnea, unspecified: Secondary | ICD-10-CM | POA: Diagnosis present

## 2014-07-27 DIAGNOSIS — Z6841 Body Mass Index (BMI) 40.0 and over, adult: Secondary | ICD-10-CM | POA: Diagnosis not present

## 2014-07-27 DIAGNOSIS — K592 Neurogenic bowel, not elsewhere classified: Secondary | ICD-10-CM | POA: Diagnosis not present

## 2014-07-27 DIAGNOSIS — G35 Multiple sclerosis: Secondary | ICD-10-CM | POA: Diagnosis not present

## 2014-07-27 DIAGNOSIS — E079 Disorder of thyroid, unspecified: Secondary | ICD-10-CM | POA: Diagnosis present

## 2014-07-27 DIAGNOSIS — I482 Chronic atrial fibrillation: Secondary | ICD-10-CM | POA: Diagnosis not present

## 2014-07-27 DIAGNOSIS — F419 Anxiety disorder, unspecified: Secondary | ICD-10-CM | POA: Diagnosis present

## 2014-07-27 DIAGNOSIS — M62838 Other muscle spasm: Secondary | ICD-10-CM | POA: Diagnosis present

## 2014-07-27 DIAGNOSIS — R252 Cramp and spasm: Secondary | ICD-10-CM | POA: Diagnosis not present

## 2014-07-27 DIAGNOSIS — G839 Paralytic syndrome, unspecified: Secondary | ICD-10-CM | POA: Diagnosis not present

## 2014-07-27 DIAGNOSIS — T82898A Other specified complication of vascular prosthetic devices, implants and grafts, initial encounter: Secondary | ICD-10-CM | POA: Diagnosis not present

## 2014-07-27 DIAGNOSIS — Z87891 Personal history of nicotine dependence: Secondary | ICD-10-CM | POA: Diagnosis not present

## 2014-07-27 DIAGNOSIS — I480 Paroxysmal atrial fibrillation: Secondary | ICD-10-CM | POA: Diagnosis not present

## 2014-07-27 DIAGNOSIS — F329 Major depressive disorder, single episode, unspecified: Secondary | ICD-10-CM | POA: Diagnosis present

## 2014-07-27 DIAGNOSIS — R0602 Shortness of breath: Secondary | ICD-10-CM | POA: Diagnosis not present

## 2014-07-27 LAB — CBC
HEMATOCRIT: 46.4 % — AB (ref 36.0–46.0)
Hemoglobin: 15.6 g/dL — ABNORMAL HIGH (ref 12.0–15.0)
MCH: 30.5 pg (ref 26.0–34.0)
MCHC: 33.6 g/dL (ref 30.0–36.0)
MCV: 90.8 fL (ref 78.0–100.0)
PLATELETS: 218 10*3/uL (ref 150–400)
RBC: 5.11 MIL/uL (ref 3.87–5.11)
RDW: 12.4 % (ref 11.5–15.5)
WBC: 7.8 10*3/uL (ref 4.0–10.5)

## 2014-07-27 LAB — BASIC METABOLIC PANEL
ANION GAP: 11 (ref 5–15)
BUN: 19 mg/dL (ref 6–23)
CHLORIDE: 99 meq/L (ref 96–112)
CO2: 31 mEq/L (ref 19–32)
Calcium: 9.4 mg/dL (ref 8.4–10.5)
Creatinine, Ser: 0.79 mg/dL (ref 0.50–1.10)
GFR calc non Af Amer: 84 mL/min — ABNORMAL LOW (ref >90)
Glucose, Bld: 97 mg/dL (ref 70–99)
POTASSIUM: 4.2 meq/L (ref 3.7–5.3)
SODIUM: 141 meq/L (ref 137–147)

## 2014-07-27 LAB — APTT: aPTT: 33 seconds (ref 24–37)

## 2014-07-27 LAB — PROTIME-INR
INR: 1.09 (ref 0.00–1.49)
PROTHROMBIN TIME: 14.2 s (ref 11.6–15.2)

## 2014-07-27 MED ORDER — CEFAZOLIN SODIUM 10 G IJ SOLR
3.0000 g | INTRAMUSCULAR | Status: DC
  Filled 2014-07-27: qty 3000

## 2014-07-27 NOTE — Progress Notes (Signed)
Anesthesia Chart Review:  Patient is a 68 year old female scheduled for Baclofen pump replacement on 07/28/14 by Dr. Vertell Limber. Recent Baclofen pump interrogation by [facility] neurologist showed the pump is failing and replacement is needed by 10/16/14. Dr. [name] referred patient to Dr. Boykin Peek and discussed with the [employer] representative.  History includes former smoker, Multiple Sclerosis (replapsing remitting MS), HTN, PAF, HLD, SOB, anxiety, depression, edema with varicose veins/venous insuffiency s/p right GSV ablation '12, chronic pain, OSA with CPAP, migraines, thyroid disease (hypothyroidism on levothyroxine). BMI is consistent with morbid obesity.  PCP is Dr. Prince Solian. Primary neurologist is Dr. Brett Fairy.  Cardiologist is Dr. Lyman Bishop. There is a telephone encounter from cardiology RN Sheral Apley indicating, "Faxed clearance for LOW RISK surgery - baclofen pump replacement (on 07/28/14) to Dr. Erline Levine. If needed, can hold warfarin 5 days prior."  Cardiac cath on 06/22/10 showed normal coronary arteries. False positive stress test. (Done due to finding of questional apical ischemia, EF 62% on 06/21/10.)  Echo on 04/22/10: Mild LVH. Normal LV systolic function, EF 94-58%. Trivial MR. Mild PR.  05/18/14 EKG: NSR, non-specific IVCD.  Preoperative labs noted.  PT/PTT WNL.  If no acute changes then I anticipate that she can proceed as planned.  George Hugh [facility] Short Stay Center/Anesthesiology Phone [phone] 07/27/2014 2:33 PM

## 2014-07-27 NOTE — Pre-Procedure Instructions (Signed)
DESI CARBY  07/27/2014   Your procedure is scheduled on:  07/28/14  Report to [facility] Admitting at 3:25 pm  Call this number if you have problems the morning of surgery: [phone]   Remember:   Do not eat food or drink liquids after midnight.   Take these medicines the morning of surgery with A SIP OF WATER: xanax,baclofen,hydrocodone,synthroid,flomax,prilosec   Do not wear jewelry, make-up or nail polish.  Do not wear lotions, powders, or perfumes. You may wear deodorant.  Do not shave 48 hours prior to surgery. Men may shave face and neck.  Do not bring valuables to the hospital.  [facility] is not responsible                  for any belongings or valuables.               Contacts, dentures or bridgework may not be worn into surgery.  Leave suitcase in the car. After surgery it may be brought to your room.  For patients admitted to the hospital, discharge time is determined by your                treatment team.               Patients discharged the day of surgery will not be allowed to drive  home.  Name and phone number of your driver: family  Special Instructions: Shower using CHG 2 nights before surgery and the night before surgery.  If you shower the day of surgery use CHG.  Use special wash - you have one bottle of CHG for all showers.  You should use approximately 1/3 of the bottle for each shower.   Please read over the following fact sheets that you were given: Pain Booklet, Coughing and Deep Breathing and Surgical Site Infection Prevention

## 2014-07-29 ENCOUNTER — Ambulatory Visit (HOSPITAL_COMMUNITY): Payer: Medicare Other | Admitting: Vascular Surgery

## 2014-07-29 ENCOUNTER — Ambulatory Visit (HOSPITAL_COMMUNITY): Payer: Medicare Other | Admitting: Anesthesiology

## 2014-07-29 ENCOUNTER — Encounter (HOSPITAL_COMMUNITY)
Admission: RE | Disposition: A | Discharge status: Rehabilitation Facility | Payer: Self-pay | Source: Ambulatory Visit | Attending: Neurosurgery

## 2014-07-29 ENCOUNTER — Inpatient Hospital Stay (HOSPITAL_COMMUNITY)
Admission: RE | Admit: 2014-07-29 | Discharge: 2014-07-31 | DRG: 041 | Disposition: A | Discharge status: Rehabilitation Facility | Payer: Medicare Other | Source: Ambulatory Visit | Attending: Neurosurgery | Admitting: Neurosurgery

## 2014-07-29 ENCOUNTER — Encounter (HOSPITAL_COMMUNITY): Payer: Self-pay | Admitting: *Deleted

## 2014-07-29 DIAGNOSIS — I482 Chronic atrial fibrillation: Secondary | ICD-10-CM | POA: Diagnosis not present

## 2014-07-29 DIAGNOSIS — Z87891 Personal history of nicotine dependence: Secondary | ICD-10-CM

## 2014-07-29 DIAGNOSIS — M62838 Other muscle spasm: Secondary | ICD-10-CM | POA: Diagnosis present

## 2014-07-29 DIAGNOSIS — R252 Cramp and spasm: Secondary | ICD-10-CM | POA: Diagnosis present

## 2014-07-29 DIAGNOSIS — E785 Hyperlipidemia, unspecified: Secondary | ICD-10-CM | POA: Diagnosis present

## 2014-07-29 DIAGNOSIS — K592 Neurogenic bowel, not elsewhere classified: Secondary | ICD-10-CM | POA: Diagnosis not present

## 2014-07-29 DIAGNOSIS — E079 Disorder of thyroid, unspecified: Secondary | ICD-10-CM | POA: Diagnosis present

## 2014-07-29 DIAGNOSIS — G35D Multiple sclerosis, unspecified: Secondary | ICD-10-CM | POA: Diagnosis present

## 2014-07-29 DIAGNOSIS — R0602 Shortness of breath: Secondary | ICD-10-CM | POA: Diagnosis not present

## 2014-07-29 DIAGNOSIS — F329 Major depressive disorder, single episode, unspecified: Secondary | ICD-10-CM | POA: Diagnosis present

## 2014-07-29 DIAGNOSIS — I10 Essential (primary) hypertension: Secondary | ICD-10-CM | POA: Diagnosis present

## 2014-07-29 DIAGNOSIS — G4730 Sleep apnea, unspecified: Secondary | ICD-10-CM | POA: Diagnosis present

## 2014-07-29 DIAGNOSIS — E039 Hypothyroidism, unspecified: Secondary | ICD-10-CM | POA: Diagnosis present

## 2014-07-29 DIAGNOSIS — F419 Anxiety disorder, unspecified: Secondary | ICD-10-CM | POA: Diagnosis present

## 2014-07-29 DIAGNOSIS — I480 Paroxysmal atrial fibrillation: Secondary | ICD-10-CM | POA: Diagnosis not present

## 2014-07-29 DIAGNOSIS — Z885 Allergy status to narcotic agent status: Secondary | ICD-10-CM | POA: Diagnosis not present

## 2014-07-29 DIAGNOSIS — G35 Multiple sclerosis: Principal | ICD-10-CM | POA: Diagnosis present

## 2014-07-29 DIAGNOSIS — E0590 Thyrotoxicosis, unspecified without thyrotoxic crisis or storm: Secondary | ICD-10-CM | POA: Diagnosis present

## 2014-07-29 DIAGNOSIS — G839 Paralytic syndrome, unspecified: Secondary | ICD-10-CM | POA: Diagnosis not present

## 2014-07-29 DIAGNOSIS — G43909 Migraine, unspecified, not intractable, without status migrainosus: Secondary | ICD-10-CM | POA: Diagnosis present

## 2014-07-29 DIAGNOSIS — Z6841 Body Mass Index (BMI) 40.0 and over, adult: Secondary | ICD-10-CM | POA: Diagnosis not present

## 2014-07-29 DIAGNOSIS — T82898A Other specified complication of vascular prosthetic devices, implants and grafts, initial encounter: Secondary | ICD-10-CM | POA: Diagnosis not present

## 2014-07-29 DIAGNOSIS — Z7901 Long term (current) use of anticoagulants: Secondary | ICD-10-CM

## 2014-07-29 HISTORY — PX: PAIN PUMP REVISION: SHX6231

## 2014-07-29 SURGERY — PAIN PUMP REVISION
Anesthesia: General | Site: Abdomen

## 2014-07-29 MED ORDER — LIDOCAINE-EPINEPHRINE 1 %-1:100000 IJ SOLN
INTRAMUSCULAR | Status: DC | PRN
  Administered 2014-07-29: 5 mL

## 2014-07-29 MED ORDER — 0.9 % SODIUM CHLORIDE (POUR BTL) OPTIME
TOPICAL | Status: DC | PRN
  Administered 2014-07-29: 1000 mL

## 2014-07-29 MED ORDER — ONDANSETRON HCL 4 MG/2ML IJ SOLN
INTRAMUSCULAR | Status: AC
  Filled 2014-07-29: qty 2

## 2014-07-29 MED ORDER — SODIUM CHLORIDE 0.9 % IJ SOLN: 3.0000 mL | INTRAMUSCULAR | Status: DC | PRN

## 2014-07-29 MED ORDER — FENTANYL CITRATE 0.05 MG/ML IJ SOLN
INTRAMUSCULAR | Status: DC | PRN
  Administered 2014-07-29: 50 ug via INTRAVENOUS

## 2014-07-29 MED ORDER — ACETAMINOPHEN 650 MG RE SUPP: 650.0000 mg | RECTAL | Status: DC | PRN

## 2014-07-29 MED ORDER — PROPOFOL 10 MG/ML IV BOLUS
INTRAVENOUS | Status: AC
  Filled 2014-07-29: qty 20

## 2014-07-29 MED ORDER — DOCUSATE SODIUM 100 MG PO CAPS: 100.0000 mg | ORAL_CAPSULE | Freq: Two times a day (BID) | ORAL | Status: DC

## 2014-07-29 MED ORDER — COENZYME Q10 30 MG PO CAPS: 30.0000 mg | ORAL_CAPSULE | Freq: Every day | ORAL | Status: DC

## 2014-07-29 MED ORDER — MORPHINE SULFATE 2 MG/ML IJ SOLN: 1.0000 mg | INTRAMUSCULAR | Status: DC | PRN

## 2014-07-29 MED ORDER — BACLOFEN 40 MG/20ML IT SOLN
INTRATHECAL | Status: DC | PRN
  Administered 2014-07-29: 40 mg via INTRATHECAL

## 2014-07-29 MED ORDER — HYDROCODONE-ACETAMINOPHEN 5-325 MG PO TABS: 1.0000 | ORAL_TABLET | Freq: Two times a day (BID) | ORAL | Status: DC | PRN

## 2014-07-29 MED ORDER — FENTANYL CITRATE 0.05 MG/ML IJ SOLN: 25.0000 ug | INTRAMUSCULAR | Status: DC | PRN

## 2014-07-29 MED ORDER — CALCIUM POLYCARBOPHIL 625 MG PO TABS
625.0000 mg | ORAL_TABLET | Freq: Two times a day (BID) | ORAL | Status: DC
  Administered 2014-07-30 (×2): 625 mg via ORAL
  Filled 2014-07-29 (×5): qty 1

## 2014-07-29 MED ORDER — MIDAZOLAM HCL 2 MG/2ML IJ SOLN
INTRAMUSCULAR | Status: AC
  Filled 2014-07-29: qty 2

## 2014-07-29 MED ORDER — MENTHOL 3 MG MT LOZG: 1.0000 | LOZENGE | OROMUCOSAL | Status: DC | PRN

## 2014-07-29 MED ORDER — NEBIVOLOL HCL 10 MG PO TABS
20.0000 mg | ORAL_TABLET | Freq: Every day | ORAL | Status: DC
  Administered 2014-07-30 – 2014-07-31 (×2): 20 mg via ORAL
  Filled 2014-07-29 (×2): qty 2

## 2014-07-29 MED ORDER — SODIUM CHLORIDE 0.9 % IV SOLN: 250.0000 mL | INTRAVENOUS | Status: DC

## 2014-07-29 MED ORDER — THROMBIN 5000 UNITS EX SOLR
CUTANEOUS | Status: DC | PRN
  Administered 2014-07-29: 5000 [IU] via TOPICAL

## 2014-07-29 MED ORDER — LEVOTHYROXINE SODIUM 50 MCG PO TABS
75.0000 ug | ORAL_TABLET | Freq: Every day | ORAL | Status: DC
  Administered 2014-07-30 – 2014-07-31 (×2): 75 ug via ORAL
  Filled 2014-07-29 (×4): qty 1

## 2014-07-29 MED ORDER — PHENOL 1.4 % MT LIQD: 1.0000 | OROMUCOSAL | Status: DC | PRN

## 2014-07-29 MED ORDER — DEXTROSE 5 % IV SOLN
3.0000 g | INTRAVENOUS | Status: DC | PRN
  Administered 2014-07-29: 3 g via INTRAVENOUS

## 2014-07-29 MED ORDER — ALPRAZOLAM 0.5 MG PO TABS: 0.5000 mg | ORAL_TABLET | Freq: Two times a day (BID) | ORAL | Status: DC | PRN

## 2014-07-29 MED ORDER — ALUM & MAG HYDROXIDE-SIMETH 200-200-20 MG/5ML PO SUSP: 30.0000 mL | Freq: Four times a day (QID) | ORAL | Status: DC | PRN

## 2014-07-29 MED ORDER — OMEGA-3-ACID ETHYL ESTERS 1 G PO CAPS
1000.0000 mg | ORAL_CAPSULE | Freq: Every day | ORAL | Status: DC
  Administered 2014-07-30 – 2014-07-31 (×2): 1000 mg via ORAL
  Filled 2014-07-29 (×2): qty 1

## 2014-07-29 MED ORDER — ONDANSETRON HCL 4 MG/2ML IJ SOLN: 4.0000 mg | INTRAMUSCULAR | Status: DC | PRN

## 2014-07-29 MED ORDER — KCL IN DEXTROSE-NACL 20-5-0.45 MEQ/L-%-% IV SOLN
INTRAVENOUS | Status: DC
  Administered 2014-07-30: 75 mL/h via INTRAVENOUS
  Administered 2014-07-30: 1000 mL via INTRAVENOUS
  Filled 2014-07-29 (×3): qty 1000

## 2014-07-29 MED ORDER — LIDOCAINE HCL (CARDIAC) 20 MG/ML IV SOLN
INTRAVENOUS | Status: DC | PRN
  Administered 2014-07-29: 10 mg via INTRAVENOUS

## 2014-07-29 MED ORDER — CEFAZOLIN SODIUM 1-5 GM-% IV SOLN
1.0000 g | Freq: Three times a day (TID) | INTRAVENOUS | Status: AC
Start: 2014-07-30 — End: 2014-07-30
  Administered 2014-07-30 (×2): 1 g via INTRAVENOUS
  Filled 2014-07-29 (×2): qty 50

## 2014-07-29 MED ORDER — LORATADINE 10 MG PO TABS
10.0000 mg | ORAL_TABLET | Freq: Every day | ORAL | Status: DC
  Administered 2014-07-30 – 2014-07-31 (×2): 10 mg via ORAL
  Filled 2014-07-29 (×2): qty 1

## 2014-07-29 MED ORDER — CALCIUM CARBONATE 1250 (500 CA) MG PO TABS
1.0000 | ORAL_TABLET | Freq: Two times a day (BID) | ORAL | Status: DC
  Administered 2014-07-30 – 2014-07-31 (×3): 500 mg via ORAL
  Filled 2014-07-29 (×3): qty 1

## 2014-07-29 MED ORDER — FAMOTIDINE 10 MG PO TABS
10.0000 mg | ORAL_TABLET | Freq: Two times a day (BID) | ORAL | Status: DC
  Administered 2014-07-30 – 2014-07-31 (×4): 10 mg via ORAL
  Filled 2014-07-29 (×4): qty 1

## 2014-07-29 MED ORDER — FENTANYL CITRATE 0.05 MG/ML IJ SOLN
INTRAMUSCULAR | Status: AC
  Filled 2014-07-29: qty 5

## 2014-07-29 MED ORDER — LACTATED RINGERS IV SOLN
INTRAVENOUS | Status: DC | PRN
  Administered 2014-07-29: 20:00:00 via INTRAVENOUS

## 2014-07-29 MED ORDER — BUPROPION HCL ER (XL) 150 MG PO TB24
300.0000 mg | ORAL_TABLET | Freq: Every day | ORAL | Status: DC
  Administered 2014-07-30 – 2014-07-31 (×2): 300 mg via ORAL
  Filled 2014-07-29 (×2): qty 2

## 2014-07-29 MED ORDER — HYDROCODONE-ACETAMINOPHEN 5-325 MG PO TABS
1.0000 | ORAL_TABLET | ORAL | Status: DC | PRN
  Administered 2014-07-29: 1 via ORAL
  Administered 2014-07-30: 2 via ORAL
  Filled 2014-07-29: qty 2
  Filled 2014-07-29: qty 1

## 2014-07-29 MED ORDER — PANTOPRAZOLE SODIUM 40 MG PO TBEC
40.0000 mg | DELAYED_RELEASE_TABLET | Freq: Every day | ORAL | Status: DC
  Administered 2014-07-30 – 2014-07-31 (×2): 40 mg via ORAL
  Filled 2014-07-29 (×2): qty 1

## 2014-07-29 MED ORDER — EPHEDRINE SULFATE 50 MG/ML IJ SOLN
INTRAMUSCULAR | Status: DC | PRN
  Administered 2014-07-29: 15 mg via INTRAVENOUS

## 2014-07-29 MED ORDER — FUROSEMIDE 40 MG PO TABS
40.0000 mg | ORAL_TABLET | Freq: Two times a day (BID) | ORAL | Status: DC
  Administered 2014-07-30 – 2014-07-31 (×3): 40 mg via ORAL
  Filled 2014-07-29 (×3): qty 1

## 2014-07-29 MED ORDER — FLUTICASONE PROPIONATE 50 MCG/ACT NA SUSP
1.0000 | Freq: Every day | NASAL | Status: DC
  Administered 2014-07-30: 1 via NASAL
  Filled 2014-07-29: qty 16

## 2014-07-29 MED ORDER — LACTULOSE 10 GM/15ML PO SOLN
10.0000 g | Freq: Every day | ORAL | Status: DC | PRN
  Administered 2014-07-31: 10 g via ORAL
  Filled 2014-07-29 (×2): qty 15

## 2014-07-29 MED ORDER — DOCUSATE SODIUM 100 MG PO CAPS
100.0000 mg | ORAL_CAPSULE | Freq: Two times a day (BID) | ORAL | Status: DC
  Administered 2014-07-30 – 2014-07-31 (×4): 100 mg via ORAL
  Filled 2014-07-29 (×4): qty 1

## 2014-07-29 MED ORDER — GABAPENTIN 300 MG PO CAPS
300.0000 mg | ORAL_CAPSULE | Freq: Three times a day (TID) | ORAL | Status: DC
  Administered 2014-07-30 – 2014-07-31 (×5): 300 mg via ORAL
  Filled 2014-07-29 (×5): qty 1

## 2014-07-29 MED ORDER — ROCURONIUM BROMIDE 50 MG/5ML IV SOLN
INTRAVENOUS | Status: AC
  Filled 2014-07-29: qty 1

## 2014-07-29 MED ORDER — LACTATED RINGERS IV SOLN
INTRAVENOUS | Status: DC
  Administered 2014-07-29: 15:00:00 via INTRAVENOUS

## 2014-07-29 MED ORDER — SODIUM CHLORIDE 0.9 % IJ SOLN
3.0000 mL | Freq: Two times a day (BID) | INTRAMUSCULAR | Status: DC
  Administered 2014-07-30: 3 mL via INTRAVENOUS

## 2014-07-29 MED ORDER — PROMETHAZINE HCL 25 MG/ML IJ SOLN: 6.2500 mg | INTRAMUSCULAR | Status: DC | PRN

## 2014-07-29 MED ORDER — BACLOFEN 40 MG/20ML IT SOLN
80.0000 mg | Freq: Once | INTRATHECAL | Status: AC
  Administered 2014-07-29: 80 mg via INTRATHECAL
  Filled 2014-07-29: qty 40

## 2014-07-29 MED ORDER — ZOLPIDEM TARTRATE 5 MG PO TABS: 5.0000 mg | ORAL_TABLET | Freq: Every evening | ORAL | Status: DC | PRN

## 2014-07-29 MED ORDER — BUPIVACAINE HCL (PF) 0.5 % IJ SOLN
INTRAMUSCULAR | Status: DC | PRN
  Administered 2014-07-29: 5 mL

## 2014-07-29 MED ORDER — SIMVASTATIN 20 MG PO TABS
20.0000 mg | ORAL_TABLET | Freq: Every day | ORAL | Status: DC
  Administered 2014-07-30 (×2): 20 mg via ORAL
  Filled 2014-07-29 (×2): qty 1

## 2014-07-29 MED ORDER — ADULT MULTIVITAMIN W/MINERALS CH
1.0000 | ORAL_TABLET | Freq: Every day | ORAL | Status: DC
  Administered 2014-07-30 – 2014-07-31 (×2): 1 via ORAL
  Filled 2014-07-29 (×2): qty 1

## 2014-07-29 MED ORDER — BACLOFEN 10 MG PO TABS: 20.0000 mg | ORAL_TABLET | Freq: Three times a day (TID) | ORAL | Status: DC | PRN

## 2014-07-29 MED ORDER — ACETAMINOPHEN 325 MG PO TABS: 650.0000 mg | ORAL_TABLET | ORAL | Status: DC | PRN

## 2014-07-29 MED ORDER — TAMSULOSIN HCL 0.4 MG PO CAPS
0.4000 mg | ORAL_CAPSULE | Freq: Every day | ORAL | Status: DC
  Administered 2014-07-30 – 2014-07-31 (×2): 0.4 mg via ORAL
  Filled 2014-07-29 (×2): qty 1

## 2014-07-29 MED ORDER — LIDOCAINE HCL (CARDIAC) 20 MG/ML IV SOLN
INTRAVENOUS | Status: AC
  Filled 2014-07-29: qty 5

## 2014-07-29 MED ORDER — VITAMIN D 1000 UNITS PO TABS
2000.0000 [IU] | ORAL_TABLET | Freq: Every day | ORAL | Status: DC
  Administered 2014-07-30: 2000 [IU] via ORAL
  Filled 2014-07-29 (×2): qty 2

## 2014-07-29 MED ORDER — MIDAZOLAM HCL 2 MG/2ML IJ SOLN: 0.5000 mg | Freq: Once | INTRAMUSCULAR | Status: DC | PRN

## 2014-07-29 MED ORDER — CEPHALEXIN 250 MG PO CAPS: 250.0000 mg | ORAL_CAPSULE | Freq: Every day | ORAL | Status: DC

## 2014-07-29 MED ORDER — PROPOFOL 10 MG/ML IV BOLUS
INTRAVENOUS | Status: DC | PRN
  Administered 2014-07-29: 200 mg via INTRAVENOUS

## 2014-07-29 MED ORDER — ONDANSETRON HCL 4 MG/2ML IJ SOLN
INTRAMUSCULAR | Status: DC | PRN
  Administered 2014-07-29: 4 mg via INTRAVENOUS

## 2014-07-29 MED ORDER — MEPERIDINE HCL 25 MG/ML IJ SOLN: 6.2500 mg | INTRAMUSCULAR | Status: DC | PRN

## 2014-07-29 SURGICAL SUPPLY — 75 items
BENZOIN TINCTURE PRP APPL 2/3 (GAUZE/BANDAGES/DRESSINGS) IMPLANT
BLADE CLIPPER SURG (BLADE) ×3 IMPLANT
BLADE SURG 10 STRL SS (BLADE) ×6 IMPLANT
BLADE SURG 15 STRL LF DISP TIS (BLADE) ×1 IMPLANT
BLADE SURG 15 STRL SS (BLADE) ×2
BOOT SUTURE AID YELLOW STND (SUTURE) ×3 IMPLANT
BRUSH SCRUB EZ 1% IODOPHOR (MISCELLANEOUS) ×3 IMPLANT
CANISTER SUCT 3000ML (MISCELLANEOUS) ×3 IMPLANT
CLOSURE WOUND 1/2 X4 (GAUZE/BANDAGES/DRESSINGS)
CONT SPEC 4OZ CLIKSEAL STRL BL (MISCELLANEOUS) IMPLANT
CORDS BIPOLAR (ELECTRODE) ×3 IMPLANT
COVER MAYO STAND STRL (DRAPES) ×3 IMPLANT
DECANTER SPIKE VIAL GLASS SM (MISCELLANEOUS) IMPLANT
DERMABOND ADVANCED (GAUZE/BANDAGES/DRESSINGS) ×2
DERMABOND ADVANCED .7 DNX12 (GAUZE/BANDAGES/DRESSINGS) ×1 IMPLANT
DRAPE C-ARM 42X72 X-RAY (DRAPES) ×3 IMPLANT
DRAPE INCISE IOBAN 85X60 (DRAPES) ×3 IMPLANT
DRAPE LAPAROTOMY 100X72X124 (DRAPES) ×3 IMPLANT
DRAPE POUCH INSTRU U-SHP 10X18 (DRAPES) ×3 IMPLANT
DRSG OPSITE POSTOP 4X8 (GAUZE/BANDAGES/DRESSINGS) ×3 IMPLANT
DRSG PAD ABDOMINAL 8X10 ST (GAUZE/BANDAGES/DRESSINGS) IMPLANT
DRSG TELFA 3X8 NADH (GAUZE/BANDAGES/DRESSINGS) ×3 IMPLANT
ELECT CAUTERY BLADE 6.4 (BLADE) ×3 IMPLANT
ELECT REM PT RETURN 9FT ADLT (ELECTROSURGICAL) ×3
ELECTRODE REM PT RTRN 9FT ADLT (ELECTROSURGICAL) ×1 IMPLANT
GAUZE SPONGE 4X4 12PLY STRL (GAUZE/BANDAGES/DRESSINGS) IMPLANT
GAUZE SPONGE 4X4 16PLY XRAY LF (GAUZE/BANDAGES/DRESSINGS) ×3 IMPLANT
GLOVE BIO SURGEON STRL SZ8 (GLOVE) ×3 IMPLANT
GLOVE BIOGEL PI IND STRL 8 (GLOVE) ×1 IMPLANT
GLOVE BIOGEL PI IND STRL 8.5 (GLOVE) ×1 IMPLANT
GLOVE BIOGEL PI INDICATOR 8 (GLOVE) ×2
GLOVE BIOGEL PI INDICATOR 8.5 (GLOVE) ×2
GLOVE ECLIPSE 7.5 STRL STRAW (GLOVE) ×3 IMPLANT
GLOVE EXAM NITRILE LRG STRL (GLOVE) IMPLANT
GLOVE EXAM NITRILE MD LF STRL (GLOVE) IMPLANT
GLOVE EXAM NITRILE XL STR (GLOVE) IMPLANT
GLOVE EXAM NITRILE XS STR PU (GLOVE) IMPLANT
GOWN STRL REUS W/ TWL LRG LVL3 (GOWN DISPOSABLE) ×1 IMPLANT
GOWN STRL REUS W/ TWL XL LVL3 (GOWN DISPOSABLE) ×2 IMPLANT
GOWN STRL REUS W/TWL 2XL LVL3 (GOWN DISPOSABLE) ×3 IMPLANT
GOWN STRL REUS W/TWL LRG LVL3 (GOWN DISPOSABLE) ×2
GOWN STRL REUS W/TWL XL LVL3 (GOWN DISPOSABLE) ×4
KIT BASIN OR (CUSTOM PROCEDURE TRAY) ×3 IMPLANT
KIT INTRATHECAL CATH PUMP (KITS) ×3 IMPLANT
KIT REVIS PUMP CNNECTR SUTLESS ×1 IMPLANT
KIT ROOM TURNOVER OR (KITS) ×3 IMPLANT
NEEDLE HYPO 18GX1.5 BLUNT FILL (NEEDLE) ×3 IMPLANT
NEEDLE HYPO 25X1 1.5 SAFETY (NEEDLE) ×3 IMPLANT
NS IRRIG 1000ML POUR BTL (IV SOLUTION) ×3 IMPLANT
PACK EENT II TURBAN DRAPE (CUSTOM PROCEDURE TRAY) ×3 IMPLANT
PATTIES SURGICAL .5 X.5 (GAUZE/BANDAGES/DRESSINGS) IMPLANT
PATTIES SURGICAL 1X1 (DISPOSABLE) IMPLANT
PENCIL BUTTON HOLSTER BLD 10FT (ELECTRODE) ×3 IMPLANT
PUMP CONNECTOR REV KIT SUTLESS ×3 IMPLANT
PUMP SYNCHROMED II 40ML RESVR (Neuro Prosthesis/Implant) ×3 IMPLANT
SPONGE LAP 4X18 X RAY DECT (DISPOSABLE) ×3 IMPLANT
SPONGE SURGIFOAM ABS GEL SZ50 (HEMOSTASIS) IMPLANT
STAPLER SKIN PROX WIDE 3.9 (STAPLE) ×3 IMPLANT
STRIP CLOSURE SKIN 1/2X4 (GAUZE/BANDAGES/DRESSINGS) IMPLANT
SUT BONE WAX W31G (SUTURE) ×3 IMPLANT
SUT SILK 0 TIES 10X30 (SUTURE) ×3 IMPLANT
SUT SILK 2 0 FS (SUTURE) ×12 IMPLANT
SUT SILK 2 0 TIES 10X30 (SUTURE) ×3 IMPLANT
SUT VIC AB 0 CT1 18XCR BRD8 (SUTURE) ×1 IMPLANT
SUT VIC AB 0 CT1 8-18 (SUTURE) ×2
SUT VIC AB 2-0 CP2 18 (SUTURE) ×3 IMPLANT
SUT VIC AB 3-0 SH 8-18 (SUTURE) ×3 IMPLANT
SYR 20ML ECCENTRIC (SYRINGE) ×3 IMPLANT
SYR 3ML LL SCALE MARK (SYRINGE) ×3 IMPLANT
SYR CONTROL 10ML LL (SYRINGE) ×3 IMPLANT
TOWEL OR 17X24 6PK STRL BLUE (TOWEL DISPOSABLE) ×3 IMPLANT
TOWEL OR 17X26 10 PK STRL BLUE (TOWEL DISPOSABLE) ×3 IMPLANT
TUBE CONNECTING 12'X1/4 (SUCTIONS) ×1
TUBE CONNECTING 12X1/4 (SUCTIONS) ×2 IMPLANT
WATER STERILE IRR 1000ML POUR (IV SOLUTION) ×3 IMPLANT

## 2014-07-29 NOTE — Op Note (Signed)
07/29/2014  9:15 PM  PATIENT:  Toni [name]  68 y.o. female  PRE-OPERATIVE DIAGNOSIS:  Depleted baclofen pump placed for spasticity from MS  POST-OPERATIVE DIAGNOSIS:  Depleted baclofen pump placed for spasticity from Toni [name]:  Procedure(s) with comments: Baclofen pump replacement (N/A) - Baclofen pump replacement  SURGEON:  Surgeon(s) and Role:    * Erline Levine, MD - Primary  PHYSICIAN ASSISTANT:   ASSISTANTS: Poteat, RN   ANESTHESIA:   general  EBL:  Total I/O In: 200 [I.V.:200] Out: -   BLOOD ADMINISTERED:none  DRAINS: none   LOCAL MEDICATIONS USED:  LIDOCAINE   SPECIMEN:  No Specimen  DISPOSITION OF SPECIMEN:  N/A  COUNTS:  YES  TOURNIQUET:  * No tourniquets in log *  DICTATION: DICTATION: Patient has implanted baclofen pump for spasticity secondary to multiple sclerosis, which is now depleted.  It was elected for patient to undergo baclofen pump revision.  PROCEDURE: Patient was brought to the operating room and given intravenous sedation.  Right upper quadrant was prepped with betadine scrub and Duraprep.  Area of planned incision was infiltrated with marcaine.  Prior incision was reopened and the old baclofen pump was externalized.  Catheter was disconnected. There was spontaneous flow of CSF.  The new baclofen pump was filled and connected to the catheter.  A new catheter was attached as it was not possible to disconnect the old catheter. 3, 2-0 silk sutures were placed in the pocket. The new pump was then internalized. Wound was irrigated with vancomycin. Then irrigated once more.  Incision was closed with 2-0 Vicryl and 3-0 vicryl sutures and dressed with a sterile occlusive dressing.  Counts were correct at the end of the case.  PLAN OF CARE: Admit to inpatient   PATIENT DISPOSITION:  PACU - hemodynamically stable.   Delay start of Pharmacological VTE agent (>24hrs) due to surgical blood loss or risk of bleeding: yes

## 2014-07-29 NOTE — Brief Op Note (Signed)
07/29/2014  9:15 PM  PATIENT:  Toni [name]  68 y.o. female  PRE-OPERATIVE DIAGNOSIS:  Depleted baclofen pump placed for spasticity from MS  POST-OPERATIVE DIAGNOSIS:  Depleted baclofen pump placed for spasticity from [place]:  Procedure(s) with comments: Baclofen pump replacement (N/A) - Baclofen pump replacement  SURGEON:  Surgeon(s) and Role:    * Erline Levine, MD - Primary  PHYSICIAN ASSISTANT:   ASSISTANTS: Poteat, RN   ANESTHESIA:   general  EBL:  Total I/O In: 200 [I.V.:200] Out: -   BLOOD ADMINISTERED:none  DRAINS: none   LOCAL MEDICATIONS USED:  LIDOCAINE   SPECIMEN:  No Specimen  DISPOSITION OF SPECIMEN:  N/A  COUNTS:  YES  TOURNIQUET:  * No tourniquets in log *  DICTATION: DICTATION: Patient has implanted baclofen pump for spasticity secondary to multiple sclerosis, which is now depleted.  It was elected for patient to undergo baclofen pump revision.  PROCEDURE: Patient was brought to the operating room and given intravenous sedation.  Right upper quadrant was prepped with betadine scrub and Duraprep.  Area of planned incision was infiltrated with marcaine.  Prior incision was reopened and the old baclofen pump was externalized.  Catheter was disconnected. There was spontaneous flow of CSF.  The new baclofen pump was filled and connected to the catheter.  A new catheter was attached as it was not possible to disconnect the old catheter. 3, 2-0 silk sutures were placed in the pocket. The new pump was then internalized. Wound was irrigated with vancomycin. Then irrigated once more.  Incision was closed with 2-0 Vicryl and 3-0 vicryl sutures and dressed with a sterile occlusive dressing.  Counts were correct at the end of the case.  PLAN OF CARE: Admit to inpatient   PATIENT DISPOSITION:  PACU - hemodynamically stable.   Delay start of Pharmacological VTE agent (>24hrs) due to surgical blood loss or risk of bleeding: yes

## 2014-07-29 NOTE — Progress Notes (Signed)
Sign out request made to Dr Glennon Mac

## 2014-07-29 NOTE — Anesthesia Procedure Notes (Signed)
Procedure Name: LMA Insertion Date/Time: 07/29/2014 8:26 PM Performed by: Carney Living Pre-anesthesia Checklist: Patient identified, Emergency Drugs available, Suction available, Patient being monitored and Timeout performed Patient Re-evaluated:Patient Re-evaluated prior to inductionOxygen Delivery Method: Circle system utilized Preoxygenation: Pre-oxygenation with 100% oxygen Intubation Type: IV induction LMA: LMA inserted LMA Size: 4.0 Tube type: Oral Number of attempts: 1 Placement Confirmation: positive ETCO2 and breath sounds checked- equal and bilateral Tube secured with: Tape Dental Injury: Teeth and Oropharynx as per pre-operative assessment

## 2014-07-29 NOTE — Transfer of Care (Signed)
Immediate Anesthesia Transfer of Care Note  Patient: Toni [name]  Procedure(s) Performed: Procedure(s) with comments: Baclofen pump replacement (N/A) - Baclofen pump replacement  Patient Location: PACU  Anesthesia Type:General  Level of Consciousness: awake, alert , oriented and patient cooperative  Airway & Oxygen Therapy: Patient Spontanous Breathing and Patient connected to nasal cannula oxygen  Post-op Assessment: Report given to PACU RN, Post -op Vital signs reviewed and stable and Patient moving all extremities X 4  Post vital signs: Reviewed and stable  Complications: No apparent anesthesia complications

## 2014-07-29 NOTE — Anesthesia Preprocedure Evaluation (Addendum)
Anesthesia Evaluation  Patient identified by MRN, date of birth, ID band Patient awake    Reviewed: Allergy & Precautions, H&P , Patient's Chart, lab work & pertinent test results, reviewed documented beta blocker date and time   History of Anesthesia Complications Negative for: history of anesthetic complications  Airway Mallampati: II  TM Distance: >3 FB Neck ROM: full    Dental  (+) Teeth Intact, Dental Advisory Given   Pulmonary shortness of breath, sleep apnea and Continuous Positive Airway Pressure Ventilation , neg pneumonia -, former smoker,  breath sounds clear to auscultation        Cardiovascular Exercise Tolerance: Good hypertension, Pt. on medications and Pt. on home beta blockers + Peripheral Vascular Disease + dysrhythmias (off coumadin for a week) Atrial Fibrillation Rhythm:regular Rate:Normal  '11 ECHO: EF 55-60%, valves OK   Neuro/Psych  Headaches, PSYCHIATRIC DISORDERS Anxiety Depression    GI/Hepatic Neg liver ROS, GERD-  Medicated and Controlled,  Endo/Other  Hyperthyroidism Morbid obesity  Renal/GU negative Renal ROS     Musculoskeletal   Abdominal (+) + obese,   Peds  Hematology negative hematology ROS (+)   Anesthesia Other Findings MS OSA- mixed type both central and peripheral on variable CPAP settings  Reproductive/Obstetrics                         Anesthesia Physical Anesthesia Plan  ASA: III  Anesthesia Plan: General LMA   Post-op Pain Management:    Induction: Intravenous  Airway Management Planned: LMA  Additional Equipment:   Intra-op Plan:   Post-operative Plan:   Informed Consent: I have reviewed the patients History and Physical, chart, labs and discussed the procedure including the risks, benefits and alternatives for the proposed anesthesia with the patient or authorized representative who has indicated his/her understanding and acceptance.    Dental Advisory Given and Dental advisory given  Plan Discussed with: CRNA, Surgeon and Anesthesiologist  Anesthesia Plan Comments: (Plan routine monitors, GA- LMA OK)     Anesthesia Quick Evaluation

## 2014-07-29 NOTE — Anesthesia Postprocedure Evaluation (Signed)
  Anesthesia Post-op Note  Patient: Toni [name]  Procedure(s) Performed: Procedure(s) with comments: Baclofen pump replacement (N/A) - Baclofen pump replacement  Patient Location: PACU  Anesthesia Type:General  Level of Consciousness: awake, alert , oriented and patient cooperative  Airway and Oxygen Therapy: Patient Spontanous Breathing and Patient connected to nasal cannula oxygen  Post-op Pain: none  Post-op Assessment: Post-op Vital signs reviewed, Patient's Cardiovascular Status Stable, Respiratory Function Stable, Patent Airway, No signs of Nausea or vomiting and Pain level controlled  Post-op Vital Signs: Reviewed and stable  Last Vitals:  Filed Vitals:   07/29/14 2200  BP: 128/60  Pulse: 107  Temp: 36.5 C  Resp: 21    Complications: No apparent anesthesia complications

## 2014-07-29 NOTE — Interval H&P Note (Signed)
History and Physical Interval Note:  07/29/2014 5:17 PM  Toni [name]  has presented today for surgery, with the diagnosis of Device malfunction  The various methods of treatment have been discussed with the patient and family. After consideration of risks, benefits and other options for treatment, the patient has consented to  Procedure(s) with comments: Baclofen pump replacement (N/A) - Baclofen pump replacement as a surgical intervention .  The patient's history has been reviewed, patient examined, no change in status, stable for surgery.  I have reviewed the patient's chart and labs.  Questions were answered to the patient's satisfaction.     Alicia Seib D

## 2014-07-29 NOTE — H&P (Signed)
Toni [name] is an 68 y.o. female.   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  She has a depleted pump and presents for urgent pump replacement.  Past Medical History  Diagnosis Date  . Multiple sclerosis     has had this may 1989-[facility] reg doc  . Hypertension   . PAF (paroxysmal atrial fibrillation)     on coumadin; documented on monitor 06/2010  . Thyroid disease   . HLD (hyperlipidemia)   . Shortness of breath   . Pneumonia   . Hyperthyroidism   . Edema     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' ablation of R GSV 2012  . Depression   . Anxiety   . Headache(784.0)     MIGRAINE   . Chronic pain   . Hx of echocardiogram 04/22/10    EF 55-60%, no valve issues  . History of stress test 06/21/10    limited exam with some degree of breast attenuatin of the apex however a component of apical ischemia is not excluded, EF 62%; cardiac cath   . Sleep apnea     cpap    Past Surgical History  Procedure Laterality Date  . Cervical fusion      with correction-june 2005  . Infusion pump implantation      baclofen infusion in lower abd  . Venous ablation  12/16/10    radiofreq ablation -Dr Elisabeth Cara and [facility]  . Child births      X11  . Cardiac catheterization  06/22/10    normal coronary arteries, PAF  . Tonsillectomy      Family History  Problem Relation Age of Onset  . Coronary artery disease Father     at age 50  . Coronary artery disease Maternal Grandmother   . Depression Maternal Grandmother   . Cancer Paternal Grandmother   . Depression Mother   . Alcoholism Son    Social History:  reports that she quit smoking about 37 years ago. She has never used smokeless tobacco. She reports that she drinks alcohol. She reports that she does not use illicit drugs.  Allergies:  Allergies  Allergen Reactions  . Oxycodone     Oral oxycodone causes vomiting  . Chlorhexidine Gluconate      Sores at site  . Zoloft [Sertraline] Hives, Swelling and Rash    Medications Prior to Admission  Medication Sig Dispense Refill  . ALPRAZolam (XANAX) 0.5 MG tablet Take 1 tablet (0.5 mg total) by mouth 2 (two) times daily as needed for anxiety. 180 tablet 1  . baclofen (LIORESAL) 20 MG tablet Take 1 tablet (20 mg total) by mouth 3 (three) times daily as needed for muscle spasms. 90 each 3  . buPROPion (WELLBUTRIN XL) 300 MG 24 hr tablet Take 300 mg by mouth daily.    . calcium carbonate (OS-CAL) 600 MG TABS tablet Take 600 mg by mouth 2 (two) times daily with a meal.    . cephALEXin (KEFLEX) 250 MG capsule Take 1 capsule by mouth daily.    . cetirizine (ZYRTEC) 10 MG tablet Take 10 mg by mouth daily.    . Cholecalciferol (VITAMIN D) 1000 UNITS capsule Take 2,000 Units by mouth daily. 2 capsules    . co-enzyme Q-10 30 MG capsule Take 30 mg by mouth daily.    . corticotropin (ATHACAR H.P.) 80 UNIT/ML injectable gel 5 injection for 5 Days in a  row every monthly from October on, 2015 .    . docusate sodium (COLACE) 100 MG capsule Take 100 mg by mouth 2 (two) times daily.    . furosemide (LASIX) 40 MG tablet Take 40 mg by mouth 2 (two) times daily.     Marland Kitchen gabapentin (NEURONTIN) 300 MG capsule Take 1-2 capsules (300-600 mg total) by mouth 3 (three) times daily as needed. Pain. (Patient taking differently: Take 600-900 mg by mouth 3 (three) times daily as needed (pain). ) 540 capsule 2  . GENERLAC 10 GM/15ML SOLN 1-2 tbsp daily    . levothyroxine (SYNTHROID, LEVOTHROID) 75 MCG tablet Take 75 mcg by mouth daily.    . Multiple Vitamin (MULITIVITAMIN WITH MINERALS) TABS Take 1 tablet by mouth daily.    . Nebivolol HCl 20 MG TABS Take 20 mg by mouth daily.     . Omega-3 Fatty Acids (FISH OIL) 600 MG CAPS Take 1,200 mg by mouth daily.    Marland Kitchen omeprazole (PRILOSEC) 20 MG capsule Take 20 mg by mouth daily.     . ranitidine (ZANTAC) 150 MG tablet Take 150 mg by mouth at bedtime.     . simvastatin (ZOCOR)  20 MG tablet Take 20 mg by mouth at bedtime.     . Tamsulosin HCl (FLOMAX) 0.4 MG CAPS Take 0.4 mg by mouth daily.    Marland Kitchen warfarin (COUMADIN) 5 MG tablet Take 1 tablet (5 mg total) by mouth daily. 90 tablet 3  . Wheat Dextrin (BENEFIBER PO) Take 1 packet by mouth 2 (two) times daily.    . fluticasone (FLONASE) 50 MCG/ACT nasal spray Place 1 spray into both nostrils Nightly. 1 g 2  . HYDROcodone-acetaminophen (NORCO/VICODIN) 5-325 MG per tablet Take 1 tablet by mouth 2 (two) times daily as needed for moderate pain.      No results found for this or any previous visit (from the past 48 hour(s)). No results found.  Review of Systems  Constitutional: Negative.   HENT: Negative.   Eyes: Negative.   Respiratory: Negative.   Cardiovascular: Negative.   Gastrointestinal: Negative.   Genitourinary: Negative.   Musculoskeletal: Negative.   Skin: Negative.   Neurological: Negative.   Endo/Heme/Allergies: Negative.   Psychiatric/Behavioral: Negative.     Blood pressure 122/70, pulse 66, temperature 98 F (36.7 C), temperature source Oral, resp. rate 20, height 5\' 3"  (1.6 m), weight 120.203 kg (265 lb), SpO2 100 %. Physical Exam  Constitutional: She is oriented to person, place, and time. She appears well-developed and well-nourished.  HENT:  Head: Normocephalic and atraumatic.  Eyes: Conjunctivae and EOM are normal. Pupils are equal, round, and reactive to light.  Neck: Normal range of motion. Neck supple.  Cardiovascular: Normal rate and regular rhythm.   Respiratory: Effort normal and breath sounds normal.  GI: Soft. Bowel sounds are normal.  Musculoskeletal: Normal range of motion.  Neurological: She is alert and oriented to person, place, and time. She displays abnormal reflex. She exhibits abnormal muscle tone. Coordination abnormal.  Skin: Skin is warm and dry.  Psychiatric: She has a normal mood and affect. Her behavior is normal. Judgment and thought content normal.      Assessment/Plan Patient with depleted baclofen pump presents for urgent pump replacement.  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  Oreste Majeed D 07/29/2014, 5:13 PM

## 2014-07-30 DIAGNOSIS — I482 Chronic atrial fibrillation: Secondary | ICD-10-CM

## 2014-07-30 DIAGNOSIS — G35 Multiple sclerosis: Principal | ICD-10-CM

## 2014-07-30 DIAGNOSIS — G839 Paralytic syndrome, unspecified: Secondary | ICD-10-CM

## 2014-07-30 MED ORDER — CETYLPYRIDINIUM CHLORIDE 0.05 % MT LIQD
7.0000 mL | Freq: Two times a day (BID) | OROMUCOSAL | Status: DC
  Administered 2014-07-30 – 2014-07-31 (×3): 7 mL via OROMUCOSAL

## 2014-07-30 MED ORDER — PNEUMOCOCCAL VAC POLYVALENT 25 MCG/0.5ML IJ INJ
0.5000 mL | INJECTION | INTRAMUSCULAR | Status: DC
  Filled 2014-07-30: qty 0.5

## 2014-07-30 MED ORDER — WARFARIN SODIUM 5 MG PO TABS
5.0000 mg | ORAL_TABLET | Freq: Once | ORAL | Status: AC
  Administered 2014-07-30: 5 mg via ORAL
  Filled 2014-07-30: qty 1

## 2014-07-30 MED ORDER — WARFARIN - PHYSICIAN DOSING INPATIENT: Freq: Every day | Status: DC

## 2014-07-30 NOTE — Progress Notes (Signed)
Pt places her self on and off CPAP using her home nasal mask. She is on auto titrate CPAP with a max pressure of 18cmH2O and min pressure of 8cmH2O per home settings. RT will continue to monitor.

## 2014-07-30 NOTE — Plan of Care (Signed)
Problem: Phase I Progression Outcomes Goal: Pain controlled with appropriate interventions Outcome: Completed/Met Date Met:  07/30/14 Goal: OOB as tolerated unless otherwise ordered Outcome: Completed/Met Date Met:  07/30/14 Goal: Incision/dressings dry and intact Outcome: Completed/Met Date Met:  07/30/14 Goal: Sutures/staples intact Outcome: Completed/Met Date Met:  07/30/14 Goal: Tubes/drains patent Outcome: Not Applicable Date Met:  90/47/53 Goal: Initial discharge plan identified Outcome: Progressing Goal: Voiding-avoid urinary catheter unless indicated Outcome: Progressing Goal: Vital signs/hemodynamically stable Outcome: Progressing

## 2014-07-30 NOTE — Progress Notes (Signed)
Rehab Admissions Coordinator Note:  Patient was screened by Cleatrice Burke for appropriateness for an Inpatient Acute Rehab Consult per PT recommendation.  At this time, we are recommending I have contacted [facility] to ask if Dr. Vertell Limber would want an inpt rehab consult.Cleatrice Burke 07/30/2014, 11:45 AM  I can be reached at [phone].

## 2014-07-30 NOTE — Consult Note (Addendum)
Physical Medicine and Rehabilitation Consult Reason for Consult: MS exacerbation with spasticity Referring Physician: Dr. Vertell Limber   HPI: Toni [name] is a 68 y.o. right handed female with history of atrial fibrillation on chronic Coumadin, multiple sclerosis diagnosed in 1989 followed by neurology services Dr. Brett Fairy. Patient lives with her husband used a walker prior to admission and was independent with assistive device. Presented 07/29/2014 with increased spasticity secondary to multiple sclerosis. Patient has a baclofen pump in place. Underwent baclofen pump replacement 07/29/2014 per Dr. Vertell Limber. Hospital course pain management as well as titration and baclofen to aid in her spasticity. Physical therapy evaluation completed 07/30/2014 with recommendations of physical medicine rehabilitation consult.PT notes plus to assist today compared to supervision with a walker yesterday  Patient states that pump has been malfunctioning for couple months. It has not given the usual amount of baclofen. She has had to supplement with oral baclofen prescribed by her neurologist, Dr. [name]. Patient has noted hand paresthesias on the right side over the last couple days. She's had more chronic paresthesias in the left hand. No speech or swallowing issues  Review of Systems  Cardiovascular: Positive for palpitations.  Neurological: Positive for headaches.       Spasticity  Psychiatric/Behavioral: Positive for depression.       Anxiety  All other systems reviewed and are negative.  Past Medical History  Diagnosis Date  . Multiple sclerosis     has had this may 1989-[facility] reg doc  . Hypertension   . PAF (paroxysmal atrial fibrillation)     on coumadin; documented on monitor 06/2010  . Thyroid disease   . HLD (hyperlipidemia)   . Shortness of breath   . Pneumonia   . Hyperthyroidism   . Edema     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' ablation of R GSV 2012  .  Depression   . Anxiety   . Headache(784.0)     MIGRAINE   . Chronic pain   . Hx of echocardiogram 04/22/10    EF 55-60%, no valve issues  . History of stress test 06/21/10    limited exam with some degree of breast attenuatin of the apex however a component of apical ischemia is not excluded, EF 62%; cardiac cath   . Sleep apnea     cpap   Past Surgical History  Procedure Laterality Date  . Cervical fusion      with correction-june 2005  . Infusion pump implantation      baclofen infusion in lower abd  . Venous ablation  12/16/10    radiofreq ablation -Dr Elisabeth Cara and [facility]  . Child births      X33  . Cardiac catheterization  06/22/10    normal coronary arteries, PAF  . Tonsillectomy     Family History  Problem Relation Age of Onset  . Coronary artery disease Father     at age 56  . Coronary artery disease Maternal Grandmother   . Depression Maternal Grandmother   . Cancer Paternal Grandmother   . Depression Mother   . Alcoholism Son    Social History:  reports that she quit smoking about 37 years ago. She has never used smokeless tobacco. She reports that she drinks alcohol. She reports that she does not use illicit drugs. Allergies:  Allergies  Allergen Reactions  . Oxycodone     Oral oxycodone causes vomiting  . Chlorhexidine Gluconate     Sores at site  .  Zoloft [Sertraline] Hives, Swelling and Rash   Medications Prior to Admission  Medication Sig Dispense Refill  . ALPRAZolam (XANAX) 0.5 MG tablet Take 1 tablet (0.5 mg total) by mouth 2 (two) times daily as needed for anxiety. 180 tablet 1  . baclofen (LIORESAL) 20 MG tablet Take 1 tablet (20 mg total) by mouth 3 (three) times daily as needed for muscle spasms. 90 each 3  . buPROPion (WELLBUTRIN XL) 300 MG 24 hr tablet Take 300 mg by mouth daily.    . calcium carbonate (OS-CAL) 600 MG TABS tablet Take 600 mg by mouth 2 (two) times daily with a meal.    . cephALEXin (KEFLEX) 250 MG capsule Take 1 capsule by mouth  daily.    . cetirizine (ZYRTEC) 10 MG tablet Take 10 mg by mouth daily.    . Cholecalciferol (VITAMIN D) 1000 UNITS capsule Take 2,000 Units by mouth daily. 2 capsules    . co-enzyme Q-10 30 MG capsule Take 30 mg by mouth daily.    . corticotropin (ATHACAR H.P.) 80 UNIT/ML injectable gel 5 injection for 5 Days in a row every monthly from October on, 2015 .    . docusate sodium (COLACE) 100 MG capsule Take 100 mg by mouth 2 (two) times daily.    . furosemide (LASIX) 40 MG tablet Take 40 mg by mouth 2 (two) times daily.     Marland Kitchen gabapentin (NEURONTIN) 300 MG capsule Take 1-2 capsules (300-600 mg total) by mouth 3 (three) times daily as needed. Pain. (Patient taking differently: Take 600-900 mg by mouth 3 (three) times daily as needed (pain). ) 540 capsule 2  . GENERLAC 10 GM/15ML SOLN 1-2 tbsp daily    . levothyroxine (SYNTHROID, LEVOTHROID) 75 MCG tablet Take 75 mcg by mouth daily.    . Multiple Vitamin (MULITIVITAMIN WITH MINERALS) TABS Take 1 tablet by mouth daily.    . Nebivolol HCl 20 MG TABS Take 20 mg by mouth daily.     . Omega-3 Fatty Acids (FISH OIL) 600 MG CAPS Take 1,200 mg by mouth daily.    Marland Kitchen omeprazole (PRILOSEC) 20 MG capsule Take 20 mg by mouth daily.     . ranitidine (ZANTAC) 150 MG tablet Take 150 mg by mouth at bedtime.     . simvastatin (ZOCOR) 20 MG tablet Take 20 mg by mouth at bedtime.     . Tamsulosin HCl (FLOMAX) 0.4 MG CAPS Take 0.4 mg by mouth daily.    Marland Kitchen warfarin (COUMADIN) 5 MG tablet Take 1 tablet (5 mg total) by mouth daily. 90 tablet 3  . Wheat Dextrin (BENEFIBER PO) Take 1 packet by mouth 2 (two) times daily.    . fluticasone (FLONASE) 50 MCG/ACT nasal spray Place 1 spray into both nostrils Nightly. 1 g 2  . HYDROcodone-acetaminophen (NORCO/VICODIN) 5-325 MG per tablet Take 1 tablet by mouth 2 (two) times daily as needed for moderate pain.      Home: Home Living Family/patient expects to be discharged to:: Private residence Living Arrangements:  Spouse/significant other, Children (son, works) Available Help at Discharge: Family, Available 24 hours/day Type of Home: House Home Access: Ramped entrance (portable ramp) Home Layout: Two level, Able to live on main level with bedroom/bathroom Home Equipment: Environmental consultant - 2 wheels, Wheelchair - manual, Grab bars - toilet, Shower seat - built in, Hand held shower head, Bedside commode, Grab bars - tub/shower (getting motorized wheelchair from friend; lift chair)  Functional History: Prior Function Level of Independence: Needs assistance Gait /  Transfers Assistance Needed: walks 50 ft with RW  Comments: husband is "my angel"  Functional Status:  Mobility: Bed Mobility General bed mobility comments: up on BSC, NT Transfers Overall transfer level: Needs assistance Equipment used: Rolling walker (2 wheeled) Transfers: Sit to/from Stand, Stand Pivot Transfers Sit to Stand: Min guard, Min assist Stand pivot transfers: Min assist General transfer comment: x 3 with each transfer progressively weaker and more "crouched" with legs and torso flexed Ambulation/Gait General Gait Details: unable due to weakness/decr safety with +1 assist    ADL:    Cognition: Cognition Overall Cognitive Status: Within Functional Limits for tasks assessed Orientation Level: Oriented X4 Cognition Arousal/Alertness: Awake/alert Behavior During Therapy: WFL for tasks assessed/performed Overall Cognitive Status: Within Functional Limits for tasks assessed  Blood pressure 118/61, pulse 112, temperature 98.8 F (37.1 C), temperature source Oral, resp. rate 18, height 5\' 3"  (1.6 m), weight 120.203 kg (265 lb), SpO2 95 %. Physical Exam  Constitutional: She is oriented to person, place, and time.  HENT:  Head: Normocephalic.  Eyes: EOM are normal.  Neck: Normal range of motion. Neck supple. No thyromegaly present.  Cardiovascular: Normal rate.  An irregularly irregular rhythm present.  Respiratory: Effort normal  and breath sounds normal. No respiratory distress.  GI: Soft. Bowel sounds are normal. She exhibits no distension. There is no tenderness.  Neurological: She is alert and oriented to person, place, and time.  Reflex Scores:      Patellar reflexes are 0 on the right side and 0 on the left side.      Achilles reflexes are 0 on the right side and 0 on the left side. Follows all commands  Reduced tone in bilateral lower limbs  Skin: Skin is warm and dry.  Motor strength is 5/5 bilateral deltoid, biceps, triceps, grip, 3 minus right hip flexor and knee extensor ankle dorsiflexor and EHL, 4/5 and left hip flexor and knee extensor and ankle dorsiflexor and EHL Sensory intact to light touch in both upper and lower limbs with paresthesias noted in the upper extremities. Cranial nerves II through XII intact Cerebellar testing shows no evidence of dysmetria on finger-nose-finger testing in the upper extremities. Heel shin testing deferred secondary to weakness  No results found for this or any previous visit (from the past 24 hour(s)). No results found.  Assessment/Plan: Diagnosis: MS with pseudo-exacerbation following baclofen pump replacement. 1. Does the need for close, 24 hr/day medical supervision in concert with the patient's rehab needs make it unreasonable for this patient to be served in a less intensive setting? Yes 2. Co-Morbidities requiring supervision/potential complications: Patient needs close titration of baclofen dose, history of A. Fib restarting Coumadin postop day #1, need to monitor, morbid obesity, sleep apnea on CPAP also requiring postoperative pain medications, Postoperative Urinary hesitancy 3. Due to bladder management, bowel management, safety, skin/wound care, disease management, medication administration, pain management and patient education, does the patient require 24 hr/day rehab nursing? Yes 4. Does the patient require coordinated care of a physician, rehab nurse, PT  (1-2 hrs/day, 5 days/week) and OT (1-2 hrs/day, 5 days/week) to address physical and functional deficits in the context of the above medical diagnosis(es)? Yes Addressing deficits in the following areas: balance, endurance, locomotion, strength, transferring, bowel/bladder control, bathing, dressing, feeding, grooming and toileting 5. Can the patient actively participate in an intensive therapy program of at least 3 hrs of therapy per day at least 5 days per week? Yes 6. The potential for patient to make measurable  gains while on inpatient rehab is good 7. Anticipated functional outcomes upon discharge from inpatient rehab are supervision  with PT, supervision with OT, n/a with SLP. 8. Estimated rehab length of stay to reach the above functional goals is: 7 days 9. Does the patient have adequate social supports and living environment to accommodate these discharge functional goals? Yes 10. Anticipated D/C setting: Home 11. Anticipated post D/C treatments: [place] therapy 12. Overall Rehab/Functional Prognosis: good  RECOMMENDATIONS: This patient's condition is appropriate for continued rehabilitative care in the following setting: CIR Patient has agreed to participate in recommended program. Yes Note that insurance prior authorization may be required for reimbursement for recommended care.  Comment:     07/30/2014

## 2014-07-30 NOTE — Progress Notes (Signed)
Pt's BP 94/66 and HR 66. Pt stated that "my heart feels fluttering." Pt stated concern over being off coumadin prior to surgery. Pt states no chest pain or SOB. Rapid response arrived to assess pt. Night shift RN aware and will continue to monitor.

## 2014-07-30 NOTE — Evaluation (Addendum)
Physical Therapy Evaluation Patient Details Name: Toni [name] MRN: [id number] DOB: 1946/08/07 Today's Date: 07/30/2014   History of Present Illness  Adm 07/29/14 for emergency baclofen pump replacement (due to failed pump). PMHx- MS, neck surgery, PAF, HTN  Clinical Impression  Patient is s/p above surgery resulting in functional limitations due to the deficits listed below (see PT Problem List). Pt much weaker than prior to surgery with inability to walk and requiring +2 to pivot transfer (due to LE weakness). Pt questions if she is having increased MS symptoms. Patient will benefit from skilled PT to increase their independence and safety with mobility to allow discharge to the venue listed below.       Follow Up Recommendations CIR;Supervision for mobility/OOB    Equipment Recommendations  None recommended by PT    Recommendations for Other Services       Precautions / Restrictions Precautions Precautions: Fall Precaution Comments: watch gait belt placement due to incision (axillae preferable)      Mobility  Bed Mobility               General bed mobility comments: up on BSC, NT  Transfers Overall transfer level: Needs assistance Equipment used: Rolling walker (2 wheeled) Transfers: Sit to/from [facility] Sit to Stand: Min guard;Min assist Stand pivot transfers: Min assist       General transfer comment: x 3 with each transfer progressively weaker and more "crouched" with legs and torso flexed  Ambulation/Gait             General Gait Details: unable due to weakness/decr safety with +1 assist  Stairs            Wheelchair Mobility    Modified Rankin (Stroke Patients Only)       Balance Overall balance assessment: Needs assistance Sitting-balance support: No upper extremity supported;Feet supported Sitting balance-Leahy Scale: Fair     Standing balance support: Bilateral upper extremity supported Standing  balance-Leahy Scale: Poor                               Pertinent Vitals/Pain Pain Assessment: 0-10 Pain Score: 5  Pain Location: abd/mid-section  Pain Descriptors / Indicators: Tightness Pain Intervention(s): Limited activity within patient's tolerance;Monitored during session;Repositioned    Home Living Family/patient expects to be discharged to:: Private residence Living Arrangements: Spouse/significant other;Children (son, works) Available Help at Discharge: Family;Available 24 hours/day Type of Home: House Home Access: Ramped entrance (portable ramp)     Home Layout: Two level;Able to live on main level with bedroom/bathroom Home Equipment: Gilford Rile - 2 wheels;Wheelchair - manual;Grab bars - toilet;Shower seat - built in;Hand held shower head;Bedside commode;Grab bars - tub/shower (getting motorized wheelchair from friend; lift chair)      Prior Function Level of Independence: Needs assistance   Gait / Transfers Assistance Needed: walks 50 ft with RW      Comments: husband is "my angel"      Hand Dominance        Extremity/Trunk Assessment   Upper Extremity Assessment: Defer to OT evaluation (limited ability to WB through UEs for upright posture)           Lower Extremity Assessment: RLE deficits/detail;LLE deficits/detail RLE Deficits / Details: knee flexion contrature ~15 degrees; knee extension 4/5 LLE Deficits / Details: knee flexion contrature ~15 degrees; knee extension 4/5  Cervical / Trunk Assessment: Kyphotic;Other exceptions  Communication   Communication: No difficulties  Cognition  Arousal/Alertness: Awake/alert Behavior During Therapy: WFL for tasks assessed/performed Overall Cognitive Status: Within Functional Limits for tasks assessed                      General Comments      Exercises        Assessment/Plan    PT Assessment Patient needs continued PT services  PT Diagnosis Generalized weakness;Difficulty  walking   PT Problem List Decreased strength;Decreased range of motion;Decreased activity tolerance;Decreased balance;Decreased mobility;Impaired tone;Pain  PT Treatment Interventions DME instruction;Gait training;Functional mobility training;Therapeutic activities;Balance training;Neuromuscular re-education;Patient/family education   PT Goals (Current goals can be found in the Care Plan section) Acute Rehab PT Goals Patient Stated Goal: be able to walk like I did before the surgery PT Goal Formulation: With patient Time For Goal Achievement: 08/06/14 Potential to Achieve Goals: Good    Frequency Min 4X/week   Barriers to discharge        Co-evaluation               End of Session Equipment Utilized During Treatment: Gait belt Activity Tolerance: Patient limited by fatigue Patient left: in chair;with call bell/phone within reach;with chair alarm set;with nursing/sitter in room Nurse Communication: Mobility status;Need for lift equipment (stedy for pivot transfers)         Time: 3403-7096 PT Time Calculation (min) (ACUTE ONLY): 38 min   Charges:   PT Evaluation $Initial PT Evaluation Tier I: 1 Procedure PT Treatments $Therapeutic Activity: 23-37 mins   PT G Codes:          Aline Wesche 2014/08/10, 11:12 AM Pager [phone]

## 2014-07-30 NOTE — Progress Notes (Signed)
Subjective: Patient reports "I feel like I'm retaining some fluid. I didn't take my lasix the past two days, but she just gave me some"  Objective: Vital signs in last 24 hours: Temp:  [97.2 F (36.2 C)-98.2 F (36.8 C)] 98.2 F (36.8 C) (12/10 0605) Pulse Rate:  [66-109] 85 (12/10 0605) Resp:  [11-21] 20 (12/10 0237) BP: (104-131)/(40-70) 126/59 mmHg (12/10 0854) SpO2:  [93 %-100 %] 93 % (12/10 0605) Weight:  [120.203 kg (265 lb)] 120.203 kg (265 lb) (12/09 1416)  Intake/Output from previous day: 12/09 0701 - 12/10 0700 In: 1080 [P.O.:480; I.V.:600] Out: -  Intake/Output this shift:    Alert, conversant, smiling. Belly soft, nondistended. No palpable fluid/swelling at pump pocket. Incision without erythema,swelling, or drainage, Drsg intact. MAEW. No issues with spasticity this AM, but pt aware of PO Baclofen PRN availability.   Lab Results:  Recent Labs  07/27/14 1224  WBC 7.8  HGB 15.6*  HCT 46.4*  PLT 218   BMET  Recent Labs  07/27/14 1224  NA 141  K 4.2  CL 99  CO2 31  GLUCOSE 97  BUN 19  CREATININE 0.79  CALCIUM 9.4    Studies/Results: No results found.  Assessment/Plan: stable   LOS: 1 day  Mobilize as tolerated. Monitor voiding.   Verdis Prime 07/30/2014, 9:40 AM

## 2014-07-31 ENCOUNTER — Encounter (HOSPITAL_COMMUNITY): Payer: Self-pay | Admitting: Neurosurgery

## 2014-07-31 ENCOUNTER — Inpatient Hospital Stay (HOSPITAL_COMMUNITY)
Admission: RE | Admit: 2014-07-31 | Discharge: 2014-08-11 | DRG: 059 | Disposition: A | Discharge status: Home Health | Payer: Medicare Other | Source: Intra-hospital | Attending: Physical Medicine & Rehabilitation | Admitting: Physical Medicine & Rehabilitation

## 2014-07-31 DIAGNOSIS — K592 Neurogenic bowel, not elsewhere classified: Secondary | ICD-10-CM | POA: Diagnosis present

## 2014-07-31 DIAGNOSIS — R209 Unspecified disturbances of skin sensation: Secondary | ICD-10-CM | POA: Diagnosis present

## 2014-07-31 DIAGNOSIS — G35 Multiple sclerosis: Principal | ICD-10-CM | POA: Diagnosis present

## 2014-07-31 DIAGNOSIS — R21 Rash and other nonspecific skin eruption: Secondary | ICD-10-CM | POA: Diagnosis not present

## 2014-07-31 DIAGNOSIS — Z6841 Body Mass Index (BMI) 40.0 and over, adult: Secondary | ICD-10-CM | POA: Diagnosis not present

## 2014-07-31 DIAGNOSIS — E785 Hyperlipidemia, unspecified: Secondary | ICD-10-CM | POA: Diagnosis present

## 2014-07-31 DIAGNOSIS — Z7901 Long term (current) use of anticoagulants: Secondary | ICD-10-CM | POA: Diagnosis not present

## 2014-07-31 DIAGNOSIS — N390 Urinary tract infection, site not specified: Secondary | ICD-10-CM | POA: Diagnosis not present

## 2014-07-31 DIAGNOSIS — F329 Major depressive disorder, single episode, unspecified: Secondary | ICD-10-CM | POA: Diagnosis present

## 2014-07-31 DIAGNOSIS — F419 Anxiety disorder, unspecified: Secondary | ICD-10-CM | POA: Diagnosis present

## 2014-07-31 DIAGNOSIS — E039 Hypothyroidism, unspecified: Secondary | ICD-10-CM | POA: Diagnosis present

## 2014-07-31 DIAGNOSIS — I480 Paroxysmal atrial fibrillation: Secondary | ICD-10-CM | POA: Diagnosis present

## 2014-07-31 DIAGNOSIS — N319 Neuromuscular dysfunction of bladder, unspecified: Secondary | ICD-10-CM | POA: Diagnosis present

## 2014-07-31 DIAGNOSIS — K5900 Constipation, unspecified: Secondary | ICD-10-CM | POA: Diagnosis present

## 2014-07-31 DIAGNOSIS — I10 Essential (primary) hypertension: Secondary | ICD-10-CM | POA: Diagnosis present

## 2014-07-31 DIAGNOSIS — G839 Paralytic syndrome, unspecified: Secondary | ICD-10-CM | POA: Diagnosis not present

## 2014-07-31 DIAGNOSIS — Z87891 Personal history of nicotine dependence: Secondary | ICD-10-CM | POA: Diagnosis not present

## 2014-07-31 DIAGNOSIS — G8389 Other specified paralytic syndromes: Secondary | ICD-10-CM | POA: Diagnosis not present

## 2014-07-31 DIAGNOSIS — R258 Other abnormal involuntary movements: Secondary | ICD-10-CM | POA: Diagnosis not present

## 2014-07-31 DIAGNOSIS — A499 Bacterial infection, unspecified: Secondary | ICD-10-CM | POA: Diagnosis not present

## 2014-07-31 LAB — PROTIME-INR
INR: 1.14 (ref 0.00–1.49)
Prothrombin Time: 14.7 seconds (ref 11.6–15.2)

## 2014-07-31 LAB — CBC
HCT: 42.8 % (ref 36.0–46.0)
Hemoglobin: 13.9 g/dL (ref 12.0–15.0)
MCH: 29.8 pg (ref 26.0–34.0)
MCHC: 32.5 g/dL (ref 30.0–36.0)
MCV: 91.6 fL (ref 78.0–100.0)
Platelets: 179 10*3/uL (ref 150–400)
RBC: 4.67 MIL/uL (ref 3.87–5.11)
RDW: 12.8 % (ref 11.5–15.5)
WBC: 8.1 10*3/uL (ref 4.0–10.5)

## 2014-07-31 MED ORDER — HYDROCODONE-ACETAMINOPHEN 5-325 MG PO TABS
1.0000 | ORAL_TABLET | ORAL | Status: DC | PRN
  Administered 2014-08-03: 1 via ORAL
  Filled 2014-07-31: qty 2
  Filled 2014-07-31 (×2): qty 1

## 2014-07-31 MED ORDER — CALCIUM CARBONATE 1250 (500 CA) MG PO TABS
1.0000 | ORAL_TABLET | Freq: Two times a day (BID) | ORAL | Status: DC
  Administered 2014-07-31 – 2014-08-11 (×22): 500 mg via ORAL
  Filled 2014-07-31 (×28): qty 1

## 2014-07-31 MED ORDER — CALCIUM POLYCARBOPHIL 625 MG PO TABS
625.0000 mg | ORAL_TABLET | Freq: Two times a day (BID) | ORAL | Status: DC
  Administered 2014-07-31 – 2014-08-09 (×15): 625 mg via ORAL
  Filled 2014-07-31 (×24): qty 1

## 2014-07-31 MED ORDER — ALPRAZOLAM 0.5 MG PO TABS
0.5000 mg | ORAL_TABLET | Freq: Two times a day (BID) | ORAL | Status: DC | PRN
  Administered 2014-07-31 – 2014-08-10 (×13): 0.5 mg via ORAL
  Filled 2014-07-31 (×14): qty 1

## 2014-07-31 MED ORDER — LACTULOSE 10 GM/15ML PO SOLN
10.0000 g | Freq: Every day | ORAL | Status: DC | PRN
  Administered 2014-08-01: 10 g via ORAL
  Filled 2014-07-31 (×2): qty 15

## 2014-07-31 MED ORDER — SORBITOL 70 % SOLN: 30.0000 mL | Freq: Every day | Status: DC | PRN

## 2014-07-31 MED ORDER — LEVOTHYROXINE SODIUM 75 MCG PO TABS
75.0000 ug | ORAL_TABLET | Freq: Every day | ORAL | Status: DC
  Administered 2014-08-01 – 2014-08-11 (×11): 75 ug via ORAL
  Filled 2014-07-31 (×12): qty 1

## 2014-07-31 MED ORDER — WARFARIN - PHARMACIST DOSING INPATIENT
Freq: Every day | Status: DC
  Administered 2014-08-03 – 2014-08-10 (×7)

## 2014-07-31 MED ORDER — GABAPENTIN 300 MG PO CAPS
300.0000 mg | ORAL_CAPSULE | Freq: Three times a day (TID) | ORAL | Status: DC
  Administered 2014-07-31 – 2014-08-11 (×32): 300 mg via ORAL
  Filled 2014-07-31 (×37): qty 1

## 2014-07-31 MED ORDER — FLUTICASONE PROPIONATE 50 MCG/ACT NA SUSP
1.0000 | Freq: Every day | NASAL | Status: DC
  Administered 2014-07-31 – 2014-08-10 (×11): 1 via NASAL
  Filled 2014-07-31: qty 16

## 2014-07-31 MED ORDER — FAMOTIDINE 10 MG PO TABS
10.0000 mg | ORAL_TABLET | Freq: Two times a day (BID) | ORAL | Status: DC
  Administered 2014-07-31 – 2014-08-11 (×20): 10 mg via ORAL
  Filled 2014-07-31 (×25): qty 1

## 2014-07-31 MED ORDER — BACLOFEN 20 MG PO TABS
20.0000 mg | ORAL_TABLET | Freq: Three times a day (TID) | ORAL | Status: DC | PRN
  Administered 2014-07-31 – 2014-08-09 (×3): 20 mg via ORAL
  Filled 2014-07-31 (×5): qty 1

## 2014-07-31 MED ORDER — DOCUSATE SODIUM 100 MG PO CAPS
100.0000 mg | ORAL_CAPSULE | Freq: Two times a day (BID) | ORAL | Status: DC
  Administered 2014-07-31 – 2014-08-11 (×22): 100 mg via ORAL
  Filled 2014-07-31 (×25): qty 1

## 2014-07-31 MED ORDER — ACETAMINOPHEN 325 MG PO TABS: 325.0000 mg | ORAL_TABLET | ORAL | Status: DC | PRN

## 2014-07-31 MED ORDER — ONDANSETRON HCL 4 MG/2ML IJ SOLN: 4.0000 mg | Freq: Four times a day (QID) | INTRAMUSCULAR | Status: DC | PRN

## 2014-07-31 MED ORDER — FUROSEMIDE 40 MG PO TABS
40.0000 mg | ORAL_TABLET | Freq: Two times a day (BID) | ORAL | Status: DC
  Administered 2014-07-31 – 2014-08-02 (×4): 40 mg via ORAL
  Filled 2014-07-31 (×8): qty 1

## 2014-07-31 MED ORDER — BUPROPION HCL ER (XL) 300 MG PO TB24
300.0000 mg | ORAL_TABLET | Freq: Every day | ORAL | Status: DC
  Administered 2014-08-01 – 2014-08-11 (×11): 300 mg via ORAL
  Filled 2014-07-31 (×14): qty 1

## 2014-07-31 MED ORDER — NEBIVOLOL HCL 10 MG PO TABS
20.0000 mg | ORAL_TABLET | Freq: Every day | ORAL | Status: DC
  Administered 2014-08-01 – 2014-08-11 (×11): 20 mg via ORAL
  Filled 2014-07-31 (×13): qty 2

## 2014-07-31 MED ORDER — CETYLPYRIDINIUM CHLORIDE 0.05 % MT LIQD
7.0000 mL | Freq: Two times a day (BID) | OROMUCOSAL | Status: DC
  Administered 2014-07-31 – 2014-08-11 (×17): 7 mL via OROMUCOSAL

## 2014-07-31 MED ORDER — ADULT MULTIVITAMIN W/MINERALS CH
1.0000 | ORAL_TABLET | Freq: Every day | ORAL | Status: DC
  Administered 2014-08-01 – 2014-08-11 (×11): 1 via ORAL
  Filled 2014-07-31 (×13): qty 1

## 2014-07-31 MED ORDER — WARFARIN SODIUM 5 MG PO TABS
5.0000 mg | ORAL_TABLET | Freq: Once | ORAL | Status: AC
  Administered 2014-07-31: 5 mg via ORAL
  Filled 2014-07-31: qty 1

## 2014-07-31 MED ORDER — VITAMIN D3 25 MCG (1000 UNIT) PO TABS
2000.0000 [IU] | ORAL_TABLET | Freq: Every day | ORAL | Status: DC
  Administered 2014-07-31 – 2014-08-11 (×12): 2000 [IU] via ORAL
  Filled 2014-07-31 (×14): qty 2

## 2014-07-31 MED ORDER — TAMSULOSIN HCL 0.4 MG PO CAPS
0.4000 mg | ORAL_CAPSULE | Freq: Every day | ORAL | Status: DC
  Administered 2014-08-01 – 2014-08-08 (×8): 0.4 mg via ORAL
  Filled 2014-07-31 (×13): qty 1

## 2014-07-31 MED ORDER — SIMVASTATIN 20 MG PO TABS
20.0000 mg | ORAL_TABLET | Freq: Every day | ORAL | Status: DC
  Administered 2014-07-31 – 2014-08-10 (×11): 20 mg via ORAL
  Filled 2014-07-31 (×13): qty 1

## 2014-07-31 MED ORDER — LORATADINE 10 MG PO TABS
10.0000 mg | ORAL_TABLET | Freq: Every day | ORAL | Status: DC
  Administered 2014-08-01 – 2014-08-11 (×11): 10 mg via ORAL
  Filled 2014-07-31 (×16): qty 1

## 2014-07-31 MED ORDER — ONDANSETRON HCL 4 MG PO TABS: 4.0000 mg | ORAL_TABLET | Freq: Four times a day (QID) | ORAL | Status: DC | PRN

## 2014-07-31 MED ORDER — OMEGA-3-ACID ETHYL ESTERS 1 G PO CAPS
1000.0000 mg | ORAL_CAPSULE | Freq: Every day | ORAL | Status: DC
  Administered 2014-08-01 – 2014-08-11 (×11): 1000 mg via ORAL
  Filled 2014-07-31 (×14): qty 1

## 2014-07-31 MED ORDER — ALUM & MAG HYDROXIDE-SIMETH 200-200-20 MG/5ML PO SUSP: 30.0000 mL | Freq: Four times a day (QID) | ORAL | Status: DC | PRN

## 2014-07-31 MED ORDER — PANTOPRAZOLE SODIUM 40 MG PO TBEC
40.0000 mg | DELAYED_RELEASE_TABLET | Freq: Every day | ORAL | Status: DC
  Administered 2014-08-01 – 2014-08-11 (×11): 40 mg via ORAL
  Filled 2014-07-31 (×11): qty 1

## 2014-07-31 NOTE — Interval H&P Note (Signed)
Toni [name] was admitted today to Inpatient Rehabilitation with the diagnosis of pseudo exacerbation of MS.  The patient's history has been reviewed, patient examined, and there is no change in status.  Patient continues to be appropriate for intensive inpatient rehabilitation.  I have reviewed the patient's chart and labs.  Questions were answered to the patient's satisfaction.  [name] T 07/31/2014, 3:31 PM

## 2014-07-31 NOTE — Progress Notes (Signed)
Pt. States she can place cpap on herself.

## 2014-07-31 NOTE — Clinical Social Work Note (Addendum)
CSW Consult Acknowledged:   CSW received consult for SNF placement. PT recommendation CIR. CIR Cleatrice Burke, RN screened the pt. CSW will continue to collaborate with CIR to determine to discharge plan.   Addendum:  CSW updated by CIR regarding the pt's admission to CIR. CSW will sign off.   [place], MSW, [place]

## 2014-07-31 NOTE — H&P (Signed)
Physical Medicine and Rehabilitation Admission H&P    No chief complaint on file. :  Chief complaint: Back pain  HPI: Toni [name] is a 68 y.o. right handed female with history of atrial fibrillation on chronic Coumadin, multiple sclerosis diagnosed in 1989 followed by neurology services Dr. Brett Fairy. Patient lives with her husband used a walker prior to admission and was independent with assistive device. Presented 07/29/2014 with increased spasticity secondary to multiple sclerosis. Patient has a baclofen pump in place and stated he had been malfunctioning for a couple of months not giving the usual dose of baclofen. Underwent baclofen pump replacement 07/29/2014 per Dr. Vertell Limber. Hospital course pain management as well as titration and baclofen to aid in her spasticity. Physical therapy evaluation completed 07/30/2014 with recommendations of physical medicine rehabilitation consult. Patient was admitted for comprehensive rehabilitation program  ROS Review of Systems  Cardiovascular: Positive for palpitations.  Neurological: Positive for headaches.   Spasticity  Psychiatric/Behavioral: Positive for depression.   Anxiety Gastrointestinal. Constipation  All other systems reviewed and are negative    Past Medical History  Diagnosis Date  . Multiple sclerosis     has had this may 1989-[facility] reg doc  . Hypertension   . PAF (paroxysmal atrial fibrillation)     on coumadin; documented on monitor 06/2010  . Thyroid disease   . HLD (hyperlipidemia)   . Shortness of breath   . Pneumonia   . Hyperthyroidism   . Edema     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' ablation of R GSV 2012  . Depression   . Anxiety   . Headache(784.0)     MIGRAINE   . Chronic pain   . Hx of echocardiogram 04/22/10    EF 55-60%, no valve issues  . History of stress test 06/21/10    limited exam with some degree of breast attenuatin of the apex however a component of apical  ischemia is not excluded, EF 62%; cardiac cath   . Sleep apnea     cpap   Past Surgical History  Procedure Laterality Date  . Cervical fusion      with correction-june 2005  . Infusion pump implantation      baclofen infusion in lower abd  . Venous ablation  12/16/10    radiofreq ablation -Dr Elisabeth Cara and [facility]  . Child births      X26  . Cardiac catheterization  06/22/10    normal coronary arteries, PAF  . Tonsillectomy     Family History  Problem Relation Age of Onset  . Coronary artery disease Father     at age 33  . Coronary artery disease Maternal Grandmother   . Depression Maternal Grandmother   . Cancer Paternal Grandmother   . Depression Mother   . Alcoholism Son    Social History:  reports that she quit smoking about 37 years ago. She has never used smokeless tobacco. She reports that she drinks alcohol. She reports that she does not use illicit drugs. Allergies:  Allergies  Allergen Reactions  . Oxycodone     Oral oxycodone causes vomiting  . Chlorhexidine Gluconate     Sores at site  . Zoloft [Sertraline] Hives, Swelling and Rash   Medications Prior to Admission  Medication Sig Dispense Refill  . ALPRAZolam (XANAX) 0.5 MG tablet Take 1 tablet (0.5 mg total) by mouth 2 (two) times daily as needed for anxiety. 180 tablet 1  . baclofen (LIORESAL) 20 MG  tablet Take 1 tablet (20 mg total) by mouth 3 (three) times daily as needed for muscle spasms. 90 each 3  . buPROPion (WELLBUTRIN XL) 300 MG 24 hr tablet Take 300 mg by mouth daily.    . calcium carbonate (OS-CAL) 600 MG TABS tablet Take 600 mg by mouth 2 (two) times daily with a meal.    . cephALEXin (KEFLEX) 250 MG capsule Take 1 capsule by mouth daily.    . cetirizine (ZYRTEC) 10 MG tablet Take 10 mg by mouth daily.    . Cholecalciferol (VITAMIN D) 1000 UNITS capsule Take 2,000 Units by mouth daily. 2 capsules    . co-enzyme Q-10 30 MG capsule Take 30 mg by mouth daily.    . corticotropin (ATHACAR H.P.) 80 UNIT/ML  injectable gel 5 injection for 5 Days in a row every monthly from October on, 2015 .    . docusate sodium (COLACE) 100 MG capsule Take 100 mg by mouth 2 (two) times daily.    . furosemide (LASIX) 40 MG tablet Take 40 mg by mouth 2 (two) times daily.     Marland Kitchen gabapentin (NEURONTIN) 300 MG capsule Take 1-2 capsules (300-600 mg total) by mouth 3 (three) times daily as needed. Pain. (Patient taking differently: Take 600-900 mg by mouth 3 (three) times daily as needed (pain). ) 540 capsule 2  . GENERLAC 10 GM/15ML SOLN 1-2 tbsp daily    . levothyroxine (SYNTHROID, LEVOTHROID) 75 MCG tablet Take 75 mcg by mouth daily.    . Multiple Vitamin (MULITIVITAMIN WITH MINERALS) TABS Take 1 tablet by mouth daily.    . Nebivolol HCl 20 MG TABS Take 20 mg by mouth daily.     . Omega-3 Fatty Acids (FISH OIL) 600 MG CAPS Take 1,200 mg by mouth daily.    Marland Kitchen omeprazole (PRILOSEC) 20 MG capsule Take 20 mg by mouth daily.     . ranitidine (ZANTAC) 150 MG tablet Take 150 mg by mouth at bedtime.     . simvastatin (ZOCOR) 20 MG tablet Take 20 mg by mouth at bedtime.     . Tamsulosin HCl (FLOMAX) 0.4 MG CAPS Take 0.4 mg by mouth daily.    Marland Kitchen warfarin (COUMADIN) 5 MG tablet Take 1 tablet (5 mg total) by mouth daily. 90 tablet 3  . Wheat Dextrin (BENEFIBER PO) Take 1 packet by mouth 2 (two) times daily.    . fluticasone (FLONASE) 50 MCG/ACT nasal spray Place 1 spray into both nostrils Nightly. 1 g 2  . HYDROcodone-acetaminophen (NORCO/VICODIN) 5-325 MG per tablet Take 1 tablet by mouth 2 (two) times daily as needed for moderate pain.      Home: Home Living Family/patient expects to be discharged to:: Private residence Living Arrangements: Spouse/significant other, Children (son, works) Available Help at Discharge: Family, Available 24 hours/day Type of Home: House Home Access: Ramped entrance (portable ramp) Home Layout: Two level, Able to live on main level with bedroom/bathroom Home Equipment: Environmental consultant - 2 wheels,  Wheelchair - manual, Grab bars - toilet, Shower seat - built in, Hand held shower head, Bedside commode, Grab bars - tub/shower (getting motorized wheelchair from friend; lift chair)   Functional History: Prior Function Level of Independence: Needs assistance Gait / Transfers Assistance Needed: walks 50 ft with RW  Comments: husband is "my angel"   Functional Status:  Mobility: Bed Mobility General bed mobility comments: up on BSC, NT Transfers Overall transfer level: Needs assistance Equipment used: Rolling walker (2 wheeled) Transfers: Sit to/from Stand, [employer]  Transfers Sit to Stand: Min guard, Min assist Stand pivot transfers: Min assist General transfer comment: x 3 with each transfer progressively weaker and more "crouched" with legs and torso flexed Ambulation/Gait General Gait Details: unable due to weakness/decr safety with +1 assist    ADL:    Cognition: Cognition Overall Cognitive Status: Within Functional Limits for tasks assessed Orientation Level: Oriented X4 Cognition Arousal/Alertness: Awake/alert Behavior During Therapy: WFL for tasks assessed/performed Overall Cognitive Status: Within Functional Limits for tasks assessed  Physical Exam: Blood pressure 122/44, pulse 98, temperature 98.3 F (36.8 C), temperature source Oral, resp. rate 18, height 5\' 3"  (1.6 m), weight 123.378 kg (272 lb), SpO2 97 %. Physical Exam Constitutional: She is oriented to person, place, and time. Morbidly obese HENT: oral mucosa pink and moist Head: Normocephalic.  Eyes: EOM are normal.  Neck: Normal range of motion. Neck supple. No thyromegaly present.  Cardiovascular: Normal rate. An irregularly irregular rhythm present. Rate is around 60 Respiratory: Effort normal and breath sounds normal. No respiratory distress.  GI: Soft. Bowel sounds are normal. She exhibits no distension. Pump site clean and well approximated. No drainage. Mildly tender. Neurological: She is alert  and oriented to person, place, and time.  Reflex Scores:  Patellar reflexes remain 0 on the right side and 0 on the left side.  Achilles reflexes remain 0 on the right side and 0 on the left side decreased tone in bilateral lower limbs   Motor strength is 5/5 bilateral deltoid, biceps, triceps, grip, 3 minus right hip flexor and knee extensor ankle dorsiflexor and EHL, 4/5 and left hip flexor and knee extensor and ankle dorsiflexor and EHL Sensory intact to light touch in both upper and lower limbs with paresthesias noted in the upper extremities. Cranial nerves II through XII intact Cerebellar testing shows no evidence of dysmetria on finger-nose-finger testing in the upper extremities. Cognitively she displayed reasonable insight, memory, awareness, attention was good. Psych: pleasant and appropriate  Results for orders placed or performed during the hospital encounter of 07/29/14 (from the past 48 hour(s))  Protime-INR     Status: None   Collection Time: 07/31/14  5:00 AM  Result Value Ref Range   Prothrombin Time 14.7 11.6 - 15.2 seconds   INR 1.14 0.00 - 1.49  CBC     Status: None   Collection Time: 07/31/14  5:00 AM  Result Value Ref Range   WBC 8.1 4.0 - 10.5 K/uL   RBC 4.67 3.87 - 5.11 MIL/uL   Hemoglobin 13.9 12.0 - 15.0 g/dL   HCT 42.8 36.0 - 46.0 %   MCV 91.6 78.0 - 100.0 fL   MCH 29.8 26.0 - 34.0 pg   MCHC 32.5 30.0 - 36.0 g/dL   RDW 12.8 11.5 - 15.5 %   Platelets 179 150 - 400 K/uL   No results found.     Medical Problem List and Plan: 1. Functional deficits secondary to multiple sclerosis with pseudo-exacerbation following baclofen pump replacement 2.  DVT Prophylaxis/Anticoagulation: Chronic Coumadin for atrial fibrillation. Monitor for any bleeding episodes 3. Pain Management: Neurontin 300 mg 3 times a day, hydrocodone as needed. Monitor with increased mobility 4. Mood/depression/anxiety: Wellbutrin 300 mg daily, Xanax 0.5 mg twice a day as needed.  Provide emotional support 5. Neuropsych: This patient is capable of making decisions on her own behalf. 6. Skin/Wound Care: Routine skin checks 7. Fluids/Electrolytes/Nutrition: Strict I and O's. Follow-up chemistries 8. Hypertension. Lasix 40 mg twice a day, Bystolic 20 mg daily. Monitor  with increased mobility 9. Hypothyroidism.Marland Kitchen Synthroid. 10. Hyperlipidemia. 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 Adjust bowel program. Continue Flomax 0.4 mg daily. Check PVRs 3    Post Admission Physician Evaluation: 1. Functional deficits secondary  to pseudo-exacerbation of ms. May be over-titrated on baclofen pump also which has led to hypotonia. 2. Patient is admitted to receive collaborative, interdisciplinary care between the physiatrist, rehab nursing staff, and therapy team. 3. Patient's level of medical complexity and substantial therapy needs in context of that medical necessity cannot be provided at a lesser intensity of care such as a SNF. 4. Patient has experienced substantial functional loss from his/her baseline which was documented above under the "Functional History" and "Functional Status" headings.  Judging by the patient's diagnosis, physical exam, and functional history, the patient has potential for functional progress which will result in measurable gains while on inpatient rehab.  These gains will be of substantial and practical use upon discharge  in facilitating mobility and self-care at the household level. 5. Physiatrist will provide 24 hour management of medical needs as well as oversight of the therapy plan/treatment and provide guidance as appropriate regarding the interaction of the two. 6. 24 hour rehab nursing will assist with bladder management, bowel management, safety, skin/wound care, disease management, medication administration, pain management and patient education  and help integrate therapy concepts, techniques,education, etc. 7. PT will assess and treat  for/with: Lower extremity strength, range of motion, stamina, balance, functional mobility, safety, adaptive techniques and equipment, NMR, tone mgt, pump adjustment, ego support, community reintegration.   Goals are: mod I to supervision. 8. OT will assess and treat for/with: ADL's, functional mobility, safety, upper extremity strength, adaptive techniques and equipment, NMR, tone mgt, pump adjustment, leisure awareness, community reintegration.   Goals are: mod I to supervision. Therapy may proceed with showering this patient. 9. SLP will assess and treat for/with: n/a.  Goals are: n/a. 10. Case Management and Social Worker will assess and treat for psychological issues and discharge planning. 11. Team conference will be held weekly to assess progress toward goals and to determine barriers to discharge. 12. Patient will receive at least 3 hours of therapy per day at least 5 days per week. 13. ELOS: 7-9 days       14. Prognosis:  excellent     Meredith Staggers, MD, [facility] 07/31/2014   07/31/2014

## 2014-07-31 NOTE — Progress Notes (Signed)
Subjective: Patient reports "I still have this fluid; and I was up all night using the bathroom."   Objective: Vital signs in last 24 hours: Temp:  [98.3 F (36.8 C)-99.3 F (37.4 C)] 98.3 F (36.8 C) (12/11 0554) Pulse Rate:  [61-112] 98 (12/11 0554) Resp:  [18] 18 (12/11 0554) BP: (74-126)/(39-75) 122/44 mmHg (12/11 0554) SpO2:  [95 %-98 %] 97 % (12/11 0554) Weight:  [123.378 kg (272 lb)] 123.378 kg (272 lb) (12/10 1435)  Intake/Output from previous day: 12/10 0701 - 12/11 0700 In: 3 [I.V.:3] Out: 1650 [Urine:1650] Intake/Output this shift:    Alert, conversant, but very anxious. Reports episode of A Fib last night with low BP, and speaks of it now as if it is current.  She then asks if she should worry about cellulitis causing her fluid retention. Long discussion with patient reveals similar "fluid retention" complaints for months, having abdominal CT (negative) to search for "fluid pockets". Reassured pt re: current appearance of abdomen post-op, pump pocket, incision.  Belly soft, nontender, without bruising. Pump palpable beneath incision without evidence of hematoma. Incision visible through honeycomb, Dermabond. No erythema, swelling, or drainage.  Discussed CIR with pt & she agrees to plan.    Lab Results:  Recent Labs  07/31/14 0500  WBC 8.1  HGB 13.9  HCT 42.8  PLT 179   BMET No results for input(s): NA, K, CL, CO2, GLUCOSE, BUN, CREATININE, CALCIUM in the last 72 hours.  Studies/Results: No results found.  Assessment/Plan:   LOS: 2 days  Transfer to CIR. Spoke with Dr. Tessa Lerner on floor. Appreciate their input regarding baclofen dosing as well.    Verdis Prime 07/31/2014, 8:40 AM

## 2014-07-31 NOTE — Plan of Care (Signed)
Problem: Phase I Progression Outcomes Goal: Voiding-avoid urinary catheter unless indicated Outcome: Completed/Met Date Met:  07/31/14     

## 2014-07-31 NOTE — H&P (View-Only) (Signed)
Physical Medicine and Rehabilitation Admission H&P    No chief complaint on file. :  Chief complaint: Back pain  HPI: Toni [name] is a 68 y.o. right handed female with history of atrial fibrillation on chronic Coumadin, multiple sclerosis diagnosed in 1989 followed by neurology services Dr. Brett Fairy. Patient lives with her husband used a walker prior to admission and was independent with assistive device. Presented 07/29/2014 with increased spasticity secondary to multiple sclerosis. Patient has a baclofen pump in place and stated he had been malfunctioning for a couple of months not giving the usual dose of baclofen. Underwent baclofen pump replacement 07/29/2014 per Dr. Vertell Limber. Hospital course pain management as well as titration and baclofen to aid in her spasticity. Physical therapy evaluation completed 07/30/2014 with recommendations of physical medicine rehabilitation consult. Patient was admitted for comprehensive rehabilitation program  ROS Review of Systems  Cardiovascular: Positive for palpitations.  Neurological: Positive for headaches.   Spasticity  Psychiatric/Behavioral: Positive for depression.   Anxiety Gastrointestinal. Constipation  All other systems reviewed and are negative    Past Medical History  Diagnosis Date  . Multiple sclerosis     has had this may 1989-[facility] doc  . Hypertension   . PAF (paroxysmal atrial fibrillation)     on coumadin; documented on monitor 06/2010  . Thyroid disease   . HLD (hyperlipidemia)   . Shortness of breath   . Pneumonia   . Hyperthyroidism   . Edema     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' ablation of R GSV 2012  . Depression   . Anxiety   . Headache(784.0)     MIGRAINE   . Chronic pain   . Hx of echocardiogram 04/22/10    EF 55-60%, no valve issues  . History of stress test 06/21/10    limited exam with some degree of breast attenuatin of the apex however a component of apical  ischemia is not excluded, EF 62%; cardiac cath   . Sleep apnea     cpap   Past Surgical History  Procedure Laterality Date  . Cervical fusion      with correction-june 2005  . Infusion pump implantation      baclofen infusion in lower abd  . Venous ablation  12/16/10    radiofreq ablation -Dr Elisabeth Cara and [facility]  . Child births      [id number]  . Cardiac catheterization  06/22/10    normal coronary arteries, PAF  . Tonsillectomy     Family History  Problem Relation Age of Onset  . Coronary artery disease Father     at age 36  . Coronary artery disease Maternal Grandmother   . Depression Maternal Grandmother   . Cancer Paternal Grandmother   . Depression Mother   . Alcoholism Son    Social History:  reports that she quit smoking about 37 years ago. She has never used smokeless tobacco. She reports that she drinks alcohol. She reports that she does not use illicit drugs. Allergies:  Allergies  Allergen Reactions  . Oxycodone     Oral oxycodone causes vomiting  . Chlorhexidine Gluconate     Sores at site  . Zoloft [Sertraline] Hives, Swelling and Rash   Medications Prior to Admission  Medication Sig Dispense Refill  . ALPRAZolam (XANAX) 0.5 MG tablet Take 1 tablet (0.5 mg total) by mouth 2 (two) times daily as needed for anxiety. 180 tablet 1  . baclofen (LIORESAL) 20 MG  tablet Take 1 tablet (20 mg total) by mouth 3 (three) times daily as needed for muscle spasms. 90 each 3  . buPROPion (WELLBUTRIN XL) 300 MG 24 hr tablet Take 300 mg by mouth daily.    . calcium carbonate (OS-CAL) 600 MG TABS tablet Take 600 mg by mouth 2 (two) times daily with a meal.    . cephALEXin (KEFLEX) 250 MG capsule Take 1 capsule by mouth daily.    . cetirizine (ZYRTEC) 10 MG tablet Take 10 mg by mouth daily.    . Cholecalciferol (VITAMIN D) 1000 UNITS capsule Take 2,000 Units by mouth daily. 2 capsules    . co-enzyme Q-10 30 MG capsule Take 30 mg by mouth daily.    . corticotropin (ATHACAR H.P.) 80 UNIT/ML  injectable gel 5 injection for 5 Days in a row every monthly from October on, 2015 .    . docusate sodium (COLACE) 100 MG capsule Take 100 mg by mouth 2 (two) times daily.    . furosemide (LASIX) 40 MG tablet Take 40 mg by mouth 2 (two) times daily.     Marland Kitchen gabapentin (NEURONTIN) 300 MG capsule Take 1-2 capsules (300-600 mg total) by mouth 3 (three) times daily as needed. Pain. (Patient taking differently: Take 600-900 mg by mouth 3 (three) times daily as needed (pain). ) 540 capsule 2  . GENERLAC 10 GM/15ML SOLN 1-2 tbsp daily    . levothyroxine (SYNTHROID, LEVOTHROID) 75 MCG tablet Take 75 mcg by mouth daily.    . Multiple Vitamin (MULITIVITAMIN WITH MINERALS) TABS Take 1 tablet by mouth daily.    . Nebivolol HCl 20 MG TABS Take 20 mg by mouth daily.     . Omega-3 Fatty Acids (FISH OIL) 600 MG CAPS Take 1,200 mg by mouth daily.    Marland Kitchen omeprazole (PRILOSEC) 20 MG capsule Take 20 mg by mouth daily.     . ranitidine (ZANTAC) 150 MG tablet Take 150 mg by mouth at bedtime.     . simvastatin (ZOCOR) 20 MG tablet Take 20 mg by mouth at bedtime.     . Tamsulosin HCl (FLOMAX) 0.4 MG CAPS Take 0.4 mg by mouth daily.    Marland Kitchen warfarin (COUMADIN) 5 MG tablet Take 1 tablet (5 mg total) by mouth daily. 90 tablet 3  . Wheat Dextrin (BENEFIBER PO) Take 1 packet by mouth 2 (two) times daily.    . fluticasone (FLONASE) 50 MCG/ACT nasal spray Place 1 spray into both nostrils Nightly. 1 g 2  . HYDROcodone-acetaminophen (NORCO/VICODIN) 5-325 MG per tablet Take 1 tablet by mouth 2 (two) times daily as needed for moderate pain.      Home: Home Living Family/patient expects to be discharged to:: Private residence Living Arrangements: Spouse/significant other, Children (son, works) Available Help at Discharge: Family, Available 24 hours/day Type of Home: House Home Access: Ramped entrance (portable ramp) Home Layout: Two level, Able to live on main level with bedroom/bathroom Home Equipment: Environmental consultant - 2 wheels,  Wheelchair - manual, Grab bars - toilet, Shower seat - built in, Hand held shower head, Bedside commode, Grab bars - tub/shower (getting motorized wheelchair from friend; lift chair)   Functional History: Prior Function Level of Independence: Needs assistance Gait / Transfers Assistance Needed: walks 50 ft with RW  Comments: husband is "my angel"   Functional Status:  Mobility: Bed Mobility General bed mobility comments: up on BSC, NT Transfers Overall transfer level: Needs assistance Equipment used: Rolling walker (2 wheeled) Transfers: Sit to/from Stand, [employer]  Transfers Sit to Stand: Min guard, Min assist Stand pivot transfers: Min assist General transfer comment: x 3 with each transfer progressively weaker and more "crouched" with legs and torso flexed Ambulation/Gait General Gait Details: unable due to weakness/decr safety with +1 assist    ADL:    Cognition: Cognition Overall Cognitive Status: Within Functional Limits for tasks assessed Orientation Level: Oriented X4 Cognition Arousal/Alertness: Awake/alert Behavior During Therapy: WFL for tasks assessed/performed Overall Cognitive Status: Within Functional Limits for tasks assessed  Physical Exam: Blood pressure 122/44, pulse 98, temperature 98.3 F (36.8 C), temperature source Oral, resp. rate 18, height 5\' 3"  (1.6 m), weight 123.378 kg (272 lb), SpO2 97 %. Physical Exam Constitutional: She is oriented to person, place, and time. Morbidly obese HENT: oral mucosa pink and moist Head: Normocephalic.  Eyes: EOM are normal.  Neck: Normal range of motion. Neck supple. No thyromegaly present.  Cardiovascular: Normal rate. An irregularly irregular rhythm present. Rate is around 60 Respiratory: Effort normal and breath sounds normal. No respiratory distress.  GI: Soft. Bowel sounds are normal. She exhibits no distension. Pump site clean and well approximated. No drainage. Mildly tender. Neurological: She is alert  and oriented to person, place, and time.  Reflex Scores:  Patellar reflexes remain 0 on the right side and 0 on the left side.  Achilles reflexes remain 0 on the right side and 0 on the left side decreased tone in bilateral lower limbs   Motor strength is 5/5 bilateral deltoid, biceps, triceps, grip, 3 minus right hip flexor and knee extensor ankle dorsiflexor and EHL, 4/5 and left hip flexor and knee extensor and ankle dorsiflexor and EHL Sensory intact to light touch in both upper and lower limbs with paresthesias noted in the upper extremities. Cranial nerves II through XII intact Cerebellar testing shows no evidence of dysmetria on finger-nose-finger testing in the upper extremities. Cognitively she displayed reasonable insight, memory, awareness, attention was good. Psych: pleasant and appropriate  Results for orders placed or performed during the hospital encounter of 07/29/14 (from the past 48 hour(s))  Protime-INR     Status: None   Collection Time: 07/31/14  5:00 AM  Result Value Ref Range   Prothrombin Time 14.7 11.6 - 15.2 seconds   INR 1.14 0.00 - 1.49  CBC     Status: None   Collection Time: 07/31/14  5:00 AM  Result Value Ref Range   WBC 8.1 4.0 - 10.5 K/uL   RBC 4.67 3.87 - 5.11 MIL/uL   Hemoglobin 13.9 12.0 - 15.0 g/dL   HCT 42.8 36.0 - 46.0 %   MCV 91.6 78.0 - 100.0 fL   MCH 29.8 26.0 - 34.0 pg   MCHC 32.5 30.0 - 36.0 g/dL   RDW 12.8 11.5 - 15.5 %   Platelets 179 150 - 400 K/uL   No results found.     Medical Problem List and Plan: 1. Functional deficits secondary to multiple sclerosis with pseudo-exacerbation following baclofen pump replacement 2.  DVT Prophylaxis/Anticoagulation: Chronic Coumadin for atrial fibrillation. Monitor for any bleeding episodes 3. Pain Management: Neurontin 300 mg 3 times a day, hydrocodone as needed. Monitor with increased mobility 4. Mood/depression/anxiety: Wellbutrin 300 mg daily, Xanax 0.5 mg twice a day as needed.  Provide emotional support 5. Neuropsych: This patient is capable of making decisions on her own behalf. 6. Skin/Wound Care: Routine skin checks 7. Fluids/Electrolytes/Nutrition: Strict I and O's. Follow-up chemistries 8. Hypertension. Lasix 40 mg twice a day, Bystolic 20 mg daily. Monitor  with increased mobility 9. Hypothyroidism.Marland Kitchen Synthroid. 10. Hyperlipidemia. 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 Adjust bowel program. Continue Flomax 0.4 mg daily. Check PVRs 3    Post Admission Physician Evaluation: 1. Functional deficits secondary  to pseudo-exacerbation of ms. May be over-titrated on baclofen pump also which has led to hypotonia. 2. Patient is admitted to receive collaborative, interdisciplinary care between the physiatrist, rehab nursing staff, and therapy team. 3. Patient's level of medical complexity and substantial therapy needs in context of that medical necessity cannot be provided at a lesser intensity of care such as a SNF. 4. Patient has experienced substantial functional loss from his/her baseline which was documented above under the "Functional History" and "Functional Status" headings.  Judging by the patient's diagnosis, physical exam, and functional history, the patient has potential for functional progress which will result in measurable gains while on inpatient rehab.  These gains will be of substantial and practical use upon discharge  in facilitating mobility and self-care at the household level. 5. Physiatrist will provide 24 hour management of medical needs as well as oversight of the therapy plan/treatment and provide guidance as appropriate regarding the interaction of the two. 6. 24 hour rehab nursing will assist with bladder management, bowel management, safety, skin/wound care, disease management, medication administration, pain management and patient education  and help integrate therapy concepts, techniques,education, etc. 7. PT will assess and treat  for/with: Lower extremity strength, range of motion, stamina, balance, functional mobility, safety, adaptive techniques and equipment, NMR, tone mgt, pump adjustment, ego support, community reintegration.   Goals are: mod I to supervision. 8. OT will assess and treat for/with: ADL's, functional mobility, safety, upper extremity strength, adaptive techniques and equipment, NMR, tone mgt, pump adjustment, leisure awareness, community reintegration.   Goals are: mod I to supervision. Therapy may proceed with showering this patient. 9. SLP will assess and treat for/with: n/a.  Goals are: n/a. 10. Case Management and Social Worker will assess and treat for psychological issues and discharge planning. 11. Team conference will be held weekly to assess progress toward goals and to determine barriers to discharge. 12. Patient will receive at least 3 hours of therapy per day at least 5 days per week. 13. ELOS: 7-9 days       14. Prognosis:  excellent     Meredith Staggers, MD, [facility] 07/31/2014   07/31/2014

## 2014-07-31 NOTE — Progress Notes (Signed)
Physical Medicine and Rehabilitation Consult Reason for Consult: MS exacerbation with spasticity Referring Physician: Dr. Vertell Limber   HPI: Toni [name] is a 68 y.o. right handed female with history of atrial fibrillation on chronic Coumadin, multiple sclerosis diagnosed in 1989 followed by neurology services Dr. Brett Fairy. Patient lives with her husband used a walker prior to admission and was independent with assistive device. Presented 07/29/2014 with increased spasticity secondary to multiple sclerosis. Patient has a baclofen pump in place. Underwent baclofen pump replacement 07/29/2014 per Dr. Vertell Limber. Hospital course pain management as well as titration and baclofen to aid in her spasticity. Physical therapy evaluation completed 07/30/2014 with recommendations of physical medicine rehabilitation consult.PT notes plus to assist today compared to supervision with a walker yesterday  Patient states that pump has been malfunctioning for couple months. It has not given the usual amount of baclofen. She has had to supplement with oral baclofen prescribed by her neurologist, Dr. [name]. Patient has noted hand paresthesias on the right side over the last couple days. She's had more chronic paresthesias in the left hand. No speech or swallowing issues  Review of Systems  Cardiovascular: Positive for palpitations.  Neurological: Positive for headaches.   Spasticity  Psychiatric/Behavioral: Positive for depression.   Anxiety  All other systems reviewed and are negative.  Past Medical History  Diagnosis Date  . Multiple sclerosis     has had this may 1989-[facility] reg doc  . Hypertension   . PAF (paroxysmal atrial fibrillation)     on coumadin; documented on monitor 06/2010  . Thyroid disease   . HLD (hyperlipidemia)   . Shortness of breath   . Pneumonia   . Hyperthyroidism   . Edema     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'  ablation of R GSV 2012  . Depression   . Anxiety   . Headache(784.0)     MIGRAINE   . Chronic pain   . Hx of echocardiogram 04/22/10    EF 55-60%, no valve issues  . History of stress test 06/21/10    limited exam with some degree of breast attenuatin of the apex however a component of apical ischemia is not excluded, EF 62%; cardiac cath   . Sleep apnea     cpap   Past Surgical History  Procedure Laterality Date  . Cervical fusion      with correction-june 2005  . Infusion pump implantation      baclofen infusion in lower abd  . Venous ablation  12/16/10    radiofreq ablation -Dr Elisabeth Cara and [facility]  . Child births      X16  . Cardiac catheterization  06/22/10    normal coronary arteries, PAF  . Tonsillectomy     Family History  Problem Relation Age of Onset  . Coronary artery disease Father     at age 73  . Coronary artery disease Maternal Grandmother   . Depression Maternal Grandmother   . Cancer Paternal Grandmother   . Depression Mother   . Alcoholism Son    Social History:  reports that she quit smoking about 37 years ago. She has never used smokeless tobacco. She reports that she drinks alcohol. She reports that she does not use illicit drugs. Allergies:  Allergies  Allergen Reactions  . Oxycodone     Oral oxycodone causes vomiting  . Chlorhexidine Gluconate     Sores at site  . Zoloft [Sertraline] Hives, Swelling and Rash   Medications Prior  to Admission  Medication Sig Dispense Refill  . ALPRAZolam (XANAX) 0.5 MG tablet Take 1 tablet (0.5 mg total) by mouth 2 (two) times daily as needed for anxiety. 180 tablet 1  . baclofen (LIORESAL) 20 MG tablet Take 1 tablet (20 mg total) by mouth 3 (three) times daily as needed for muscle spasms. 90 each 3  . buPROPion (WELLBUTRIN XL) 300 MG 24 hr tablet Take 300 mg by mouth daily.     . calcium carbonate (OS-CAL) 600 MG TABS tablet Take 600 mg by mouth 2 (two) times daily with a meal.    . cephALEXin (KEFLEX) 250 MG capsule Take 1 capsule by mouth daily.    . cetirizine (ZYRTEC) 10 MG tablet Take 10 mg by mouth daily.    . Cholecalciferol (VITAMIN D) 1000 UNITS capsule Take 2,000 Units by mouth daily. 2 capsules    . co-enzyme Q-10 30 MG capsule Take 30 mg by mouth daily.    . corticotropin (ATHACAR H.P.) 80 UNIT/ML injectable gel 5 injection for 5 Days in a row every monthly from October on, 2015 .    . docusate sodium (COLACE) 100 MG capsule Take 100 mg by mouth 2 (two) times daily.    . furosemide (LASIX) 40 MG tablet Take 40 mg by mouth 2 (two) times daily.     Marland Kitchen gabapentin (NEURONTIN) 300 MG capsule Take 1-2 capsules (300-600 mg total) by mouth 3 (three) times daily as needed. Pain. (Patient taking differently: Take 600-900 mg by mouth 3 (three) times daily as needed (pain). ) 540 capsule 2  . GENERLAC 10 GM/15ML SOLN 1-2 tbsp daily    . levothyroxine (SYNTHROID, LEVOTHROID) 75 MCG tablet Take 75 mcg by mouth daily.    . Multiple Vitamin (MULITIVITAMIN WITH MINERALS) TABS Take 1 tablet by mouth daily.    . Nebivolol HCl 20 MG TABS Take 20 mg by mouth daily.     . Omega-3 Fatty Acids (FISH OIL) 600 MG CAPS Take 1,200 mg by mouth daily.    Marland Kitchen omeprazole (PRILOSEC) 20 MG capsule Take 20 mg by mouth daily.     . ranitidine (ZANTAC) 150 MG tablet Take 150 mg by mouth at bedtime.     . simvastatin (ZOCOR) 20 MG tablet Take 20 mg by mouth at bedtime.     . Tamsulosin HCl (FLOMAX) 0.4 MG CAPS Take 0.4 mg by mouth daily.    Marland Kitchen warfarin (COUMADIN) 5 MG tablet Take 1 tablet (5 mg total) by mouth daily. 90 tablet 3  . Wheat Dextrin (BENEFIBER PO) Take 1 packet by mouth 2 (two) times daily.    . fluticasone (FLONASE) 50 MCG/ACT nasal spray Place 1 spray into both nostrils Nightly.  1 g 2  . HYDROcodone-acetaminophen (NORCO/VICODIN) 5-325 MG per tablet Take 1 tablet by mouth 2 (two) times daily as needed for moderate pain.      Home: Home Living Family/patient expects to be discharged to:: Private residence Living Arrangements: Spouse/significant other, Children (son, works) Available Help at Discharge: Family, Available 24 hours/day Type of Home: House Home Access: Ramped entrance (portable ramp) Home Layout: Two level, Able to live on main level with bedroom/bathroom Home Equipment: Environmental consultant - 2 wheels, Wheelchair - manual, Grab bars - toilet, Shower seat - built in, Hand held shower head, Bedside commode, Grab bars - tub/shower (getting motorized wheelchair from friend; lift chair)  Functional History: Prior Function Level of Independence: Needs assistance Gait / Transfers Assistance Needed: walks 50 ft with RW  Comments:  husband is "my angel"  Functional Status:  Mobility: Bed Mobility General bed mobility comments: up on BSC, NT Transfers Overall transfer level: Needs assistance Equipment used: Rolling walker (2 wheeled) Transfers: Sit to/from Stand, Stand Pivot Transfers Sit to Stand: Min guard, Min assist Stand pivot transfers: Min assist General transfer comment: x 3 with each transfer progressively weaker and more "crouched" with legs and torso flexed Ambulation/Gait General Gait Details: unable due to weakness/decr safety with +1 assist    ADL:    Cognition: Cognition Overall Cognitive Status: Within Functional Limits for tasks assessed Orientation Level: Oriented X4 Cognition Arousal/Alertness: Awake/alert Behavior During Therapy: WFL for tasks assessed/performed Overall Cognitive Status: Within Functional Limits for tasks assessed  Blood pressure 118/61, pulse 112, temperature 98.8 F (37.1 C), temperature source Oral, resp. rate 18, height 5\' 3"  (1.6 m), weight 120.203 kg (265 lb), SpO2 95 %. Physical Exam  Constitutional: She  is oriented to person, place, and time.  HENT:  Head: Normocephalic.  Eyes: EOM are normal.  Neck: Normal range of motion. Neck supple. No thyromegaly present.  Cardiovascular: Normal rate. An irregularly irregular rhythm present.  Respiratory: Effort normal and breath sounds normal. No respiratory distress.  GI: Soft. Bowel sounds are normal. She exhibits no distension. There is no tenderness.  Neurological: She is alert and oriented to person, place, and time.  Reflex Scores:  Patellar reflexes are 0 on the right side and 0 on the left side.  Achilles reflexes are 0 on the right side and 0 on the left side. Follows all commands  Reduced tone in bilateral lower limbs  Skin: Skin is warm and dry.  Motor strength is 5/5 bilateral deltoid, biceps, triceps, grip, 3 minus right hip flexor and knee extensor ankle dorsiflexor and EHL, 4/5 and left hip flexor and knee extensor and ankle dorsiflexor and EHL Sensory intact to light touch in both upper and lower limbs with paresthesias noted in the upper extremities. Cranial nerves II through XII intact Cerebellar testing shows no evidence of dysmetria on finger-nose-finger testing in the upper extremities. Heel shin testing deferred secondary to weakness   Lab Results Last 24 Hours    No results found for this or any previous visit (from the past 24 hour(s)).    Imaging Results (Last 48 hours)    No results found.    Assessment/Plan: Diagnosis: MS with pseudo-exacerbation following baclofen pump replacement. 1. Does the need for close, 24 hr/day medical supervision in concert with the patient's rehab needs make it unreasonable for this patient to be served in a less intensive setting? Yes 2. Co-Morbidities requiring supervision/potential complications: Patient needs close titration of baclofen dose, history of A. Fib restarting Coumadin postop day #1, need to monitor, morbid obesity, sleep apnea on CPAP also requiring postoperative  pain medications, Postoperative Urinary hesitancy 3. Due to bladder management, bowel management, safety, skin/wound care, disease management, medication administration, pain management and patient education, does the patient require 24 hr/day rehab nursing? Yes 4. Does the patient require coordinated care of a physician, rehab nurse, PT (1-2 hrs/day, 5 days/week) and OT (1-2 hrs/day, 5 days/week) to address physical and functional deficits in the context of the above medical diagnosis(es)? Yes Addressing deficits in the following areas: balance, endurance, locomotion, strength, transferring, bowel/bladder control, bathing, dressing, feeding, grooming and toileting 5. Can the patient actively participate in an intensive therapy program of at least 3 hrs of therapy per day at least 5 days per week? Yes 6. The potential for patient  to make measurable gains while on inpatient rehab is good 7. Anticipated functional outcomes upon discharge from inpatient rehab are supervision with PT, supervision with OT, n/a with SLP. 8. Estimated rehab length of stay to reach the above functional goals is: 7 days 9. Does the patient have adequate social supports and living environment to accommodate these discharge functional goals? Yes 10. Anticipated D/C setting: Home 11. Anticipated post D/C treatments: [facility] therapy 12. Overall Rehab/Functional Prognosis: good  RECOMMENDATIONS: This patient's condition is appropriate for continued rehabilitative care in the following setting: CIR Patient has agreed to participate in recommended program. Yes Note that insurance prior authorization may be required for reimbursement for recommended care.  Comment:     07/30/2014       Revision History     Date/Time User Provider Type Action   07/30/2014 4:17 PM Charlett Blake, MD Physician Addend   07/30/2014 4:15 PM Charlett Blake, MD Physician Sign   07/30/2014 1:31 PM Cathlyn Parsons, PA-C Physician  Assistant Pend   View Details Report       Routing History     Date/Time From To Method   07/30/2014 4:17 PM Charlett Blake, MD Charlett Blake, MD In Basket   07/30/2014 4:17 PM Charlett Blake, MD [name], MD Fax

## 2014-07-31 NOTE — Progress Notes (Signed)
PMR Admission Coordinator Pre-Admission Assessment  Patient: Toni [name] is an 68 y.o., female MRN: [id number] DOB: [birth year] Height: 5\' 3"  (160 cm) Weight: 123.378 kg (272 lb)  Insurance Information HMO: PPO: PCP: IPA: 80/20: yes OTHER: no HMO PRIMARY: medicare a and b Policy#: [id number] a Subscriber: pt Benefits: Phone #: [place] online Name: 07/30/14 Eff. Date: a 06/21/04 b 04/21/06 Deduct: $1260 Out of Pocket Max: none Life Max: none CIR: 100% SNF: 20 full days Outpatient: 80% Co-Pay: 20% Home Health: 100% Co-Pay: none DME: 80% Co-Pay: 20% Providers: pt choice  SECONDARY: [employer] [place] Policy#: [id number] Subscriber: pt  Medicaid Application Date: Case Manager:  Disability Application Date: Case Worker:   Emergency Contact Information Contact Information    Name Relation Home Work Mobile   Haas,Ronald Spouse [phone]  [phone]     Current Medical History  Patient Admitting Diagnosis: MS with pseudo-exacerbation following baclofen pump replacement  History of Present Illness: Toni [name] is a 68 y.o. right handed female with history of atrial fibrillation on chronic Coumadin, multiple sclerosis diagnosed in 1989 followed by neurology services Dr. Brett Fairy. Patient lives with her husband used a walker prior to admission and was independent with assistive device. Presented 07/29/2014 with increased spasticity secondary to multiple sclerosis. Patient has a baclofen pump in place. Underwent baclofen pump replacement 07/29/2014 per Dr. Vertell Limber. Hospital course pain management as well as titration and baclofen to aid in her spasticity. Physical therapy evaluation completed 07/30/2014 with recommendations of physical medicine  rehabilitation consult.PT notes plus to assist today compared to supervision with a walker yesterday  Patient states that pump has been malfunctioning for couple months. It has not given the usual amount of baclofen. She has had to supplement with oral baclofen prescribed by her neurologist, Dr. [name]. Patient has noted hand paresthesias on the right side over the last couple days. She's had more chronic paresthesias in the left hand. No speech or swallowing issues.  Anxious since admission. Reported episode of A fib with low BP 12/10 evening. Noted fluid retention complaints for months.   Past Medical History  Past Medical History  Diagnosis Date  . Multiple sclerosis     has had this may 1989-[place] reg doc  . Hypertension   . PAF (paroxysmal atrial fibrillation)     on coumadin; documented on monitor 06/2010  . Thyroid disease   . HLD (hyperlipidemia)   . Shortness of breath   . Pneumonia   . Hyperthyroidism   . Edema     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' ablation of R GSV 2012  . Depression   . Anxiety   . Headache(784.0)     MIGRAINE   . Chronic pain   . Hx of echocardiogram 04/22/10    EF 55-60%, no valve issues  . History of stress test 06/21/10    limited exam with some degree of breast attenuatin of the apex however a component of apical ischemia is not excluded, EF 62%; cardiac cath   . Sleep apnea     cpap    Family History  family history includes Alcoholism in her son; Cancer in her paternal grandmother; Coronary artery disease in her father and maternal grandmother; Depression in her maternal grandmother and mother.  Prior Rehab/Hospitalizations: broke r ankle 3/15 and received rehab at [facility] for 3 months  Current Medications  Current facility-administered medications: 0.9 % sodium chloride infusion, 250 mL, Intravenous, Continuous, Erline Levine, MD, 250 mL at  07/30/14 0129; acetaminophen (TYLENOL) tablet 650 mg, 650 mg, Oral, Q4H PRN **OR** acetaminophen (TYLENOL) suppository 650 mg, 650 mg, Rectal, Q4H PRN, Erline Levine, MD; ALPRAZolam Duanne Moron) tablet 0.5 mg, 0.5 mg, Oral, BID PRN, Erline Levine, MD alum & mag hydroxide-simeth (MAALOX/MYLANTA) 200-200-20 MG/5ML suspension 30 mL, 30 mL, Oral, Q6H PRN, Erline Levine, MD; antiseptic oral rinse (CPC / CETYLPYRIDINIUM CHLORIDE 0.05%) solution 7 mL, 7 mL, Mouth Rinse, q12n4p, Erline Levine, MD, 7 mL at 07/30/14 1600; baclofen (LIORESAL) tablet 20 mg, 20 mg, Oral, TID PRN, Erline Levine, MD; buPROPion (WELLBUTRIN XL) 24 hr tablet 300 mg, 300 mg, Oral, Daily, Erline Levine, MD, 300 mg at 07/31/14 1013 calcium carbonate (OS-CAL - dosed in mg of elemental calcium) tablet 500 mg of elemental calcium, 1 tablet, Oral, BID WC, Erline Levine, MD, 500 mg of elemental calcium at 07/31/14 0802; cholecalciferol (VITAMIN D) tablet 2,000 Units, 2,000 Units, Oral, Daily, Erline Levine, MD, 2,000 Units at 07/30/14 1056 dextrose 5 % and 0.45 % NaCl with KCl 20 mEq/L infusion, , Intravenous, Continuous, Erline Levine, MD, Last Rate: 75 mL/hr at 07/30/14 2026, 1,000 mL at 07/30/14 2026; docusate sodium (COLACE) capsule 100 mg, 100 mg, Oral, BID, Erline Levine, MD, 100 mg at 07/31/14 1013; famotidine (PEPCID) tablet 10 mg, 10 mg, Oral, BID, Erline Levine, MD, 10 mg at 07/31/14 0544 fluticasone (FLONASE) 50 MCG/ACT nasal spray 1 spray, 1 spray, Each Nare, QHS, Erline Levine, MD, 1 spray at 07/30/14 2147; furosemide (LASIX) tablet 40 mg, 40 mg, Oral, BID, Erline Levine, MD, 40 mg at 07/31/14 0802; gabapentin (NEURONTIN) capsule 300 mg, 300 mg, Oral, TID, Erline Levine, MD, 300 mg at 07/31/14 1013 HYDROcodone-acetaminophen (NORCO/VICODIN) 5-325 MG per tablet 1-2 tablet, 1-2 tablet, Oral, Q4H PRN, Erline Levine, MD, 2 tablet at 07/30/14 1056; lactulose ([place]) 10 GM/15ML solution 10 g, 10 g, Oral, Daily PRN, Erline Levine, MD, 10 g at 07/31/14  5638; levothyroxine (SYNTHROID, LEVOTHROID) tablet 75 mcg, 75 mcg, Oral, QAC breakfast, Erline Levine, MD, 75 mcg at 07/31/14 0545 loratadine (CLARITIN) tablet 10 mg, 10 mg, Oral, Daily, Erline Levine, MD, 10 mg at 07/31/14 1013; menthol-cetylpyridinium (CEPACOL) lozenge 3 mg, 1 lozenge, Oral, PRN **OR** phenol (CHLORASEPTIC) mouth spray 1 spray, 1 spray, Mouth/Throat, PRN, Erline Levine, MD; morphine 2 MG/ML injection 1-4 mg, 1-4 mg, Intravenous, Q3H PRN, Erline Levine, MD multivitamin with minerals tablet 1 tablet, 1 tablet, Oral, Daily, Erline Levine, MD, 1 tablet at 07/31/14 1013; nebivolol (BYSTOLIC) tablet 20 mg, 20 mg, Oral, Daily, Erline Levine, MD, 20 mg at 07/30/14 1057; omega-3 acid ethyl esters (LOVAZA) capsule 1,000 mg, 1,000 mg, Oral, Daily, Erline Levine, MD, 1,000 mg at 07/31/14 1013; ondansetron (ZOFRAN) injection 4 mg, 4 mg, Intravenous, Q4H PRN, Erline Levine, MD pantoprazole (PROTONIX) EC tablet 40 mg, 40 mg, Oral, Daily, Erline Levine, MD, 40 mg at 07/31/14 0546; pneumococcal 23 valent vaccine (PNU-IMMUNE) injection 0.5 mL, 0.5 mL, Intramuscular, Tomorrow-1000, Erline Levine, MD, 0.5 mL at 07/31/14 1000; polycarbophil (FIBERCON) tablet 625 mg, 625 mg, Oral, BID, Erline Levine, MD, 625 mg at 07/30/14 2145; simvastatin (ZOCOR) tablet 20 mg, 20 mg, Oral, QHS, Erline Levine, MD, 20 mg at 07/30/14 2146 sodium chloride 0.9 % injection 3 mL, 3 mL, Intravenous, Q12H, Erline Levine, MD, 3 mL at 07/30/14 2149; sodium chloride 0.9 % injection 3 mL, 3 mL, Intravenous, PRN, Erline Levine, MD; tamsulosin [facility]) capsule 0.4 mg, 0.4 mg, Oral, Daily, Erline Levine, MD, 0.4 mg at 07/31/14 1013; Warfarin - Physician Dosing Inpatient, , Does not apply, q1800, Georgina Snell  Drucilla Chalet, RPH; zolpidem (AMBIEN) tablet 5 mg, 5 mg, Oral, QHS PRN, Erline Levine, MD  Patients Current Diet: Diet Heart  Precautions / Restrictions Precautions Precautions: Fall Precaution Comments: watch gait belt placement due to incision  (axillae preferable) Restrictions Weight Bearing Restrictions: No   Prior Activity Level Community (5-7x/wk): in community 2 to 3 times per week; uses w/c in [employer] [facility] [place] Devices/Equipment: CPAP, Built-in shower seat, Bedside commode/3-in-1, Environmental consultant (specify type) Home Equipment: Environmental consultant - 2 wheels, Wheelchair - manual, Grab bars - toilet, Shower seat - built in, Hand held shower head, Bedside commode, Grab bars - tub/shower (getting motorized wheelchair from friend; lift chair)  Prior Functional Level Prior Function Level of Independence: Needs assistance Gait / Transfers Assistance Needed: walks 50 ft with RW  Comments: husband is "my angel"   Current Functional Level Cognition  Overall Cognitive Status: Within Functional Limits for tasks assessed Orientation Level: Oriented X4   Extremity Assessment (includes Sensation/Coordination)          ADLs       Mobility  General bed mobility comments: up on BSC, NT    Transfers  Overall transfer level: Needs assistance Equipment used: Rolling walker (2 wheeled) Transfers: Sit to/from Stand, Risk manager Sit to Stand: Min guard, Min assist Stand pivot transfers: Min assist General transfer comment: x 3 with each transfer progressively weaker and more "crouched" with legs and torso flexed    Ambulation / Gait / Stairs / Wheelchair Mobility  Ambulation/Gait General Gait Details: unable due to weakness/decr safety with +1 assist    Posture / Balance      Special needs/care consideration BiPAP/CPAP yes Skin surgical dressing to r lq  Bowel mgmt: continent Bladder mgmt: continent but wears poise pads due to urgency Has been using oral baclofen since pump dysfunctioning for 4 months   Previous Home Environment Living Arrangements: Spouse/significant other, Children (son, works) Lives With: Spouse, Son, Other (Comment)  (84 yo son lives with her) Available Help at Discharge: Family, Available 24 hours/day Type of Home: [name] Home Layout: Two level, Able to live on main level with bedroom/bathroom Home Access: Ramped entrance Bathroom Shower/Tub: Walk-in shower, Door [employer] Toilet: Handicapped height Bathroom Accessibility: Yes How Accessible: Accessible via walker [place]: Other (Comment) Additional Comments: has used [place] in the past  Discharge Living Setting Plans for Discharge Living Setting: Patient's home, Lives with (comment), Other (Comment) (spouse and 33 yo son lives with them) Type of Home at Discharge: [name] Discharge Home Layout: Two level, Able to live on main level with bedroom/bathroom Discharge Home Access: [place] entrance Discharge Bathroom Shower/Tub: Walk-in shower, Door Discharge Bathroom Toilet: Handicapped height Discharge Bathroom Accessibility: Yes How Accessible: Accessible via walker Does the patient have any problems obtaining your medications?: No  Social/Family/Support Systems Patient Roles: Spouse, Parent Contact Information: Leward Quan Anticipated Caregiver: spouse Anticipated Ambulance person Information: see above Ability/Limitations of Caregiver: spouse can provide min assist.  Caregiver Availability: 24/7 Discharge Plan Discussed with Primary Caregiver: Yes Is Caregiver In Agreement with Plan?: Yes Does Caregiver/Family have Issues with Lodging/Transportation while Pt is in Rehab?: No  Husband assist pt in and out of shower by holding onto her walker as she exits shower. Spouse does all home management and cooking. Pt disabled from [place]. Was Social research officer, government at [employer].  Goals/Additional Needs Patient/Family Goal for Rehab: supervision with PT and OT Expected length of stay: ELOS 7 days Special  Service Needs: baclofen pump replacement this admission Pt/Family Agrees to Admission and willing to  participate: Yes Program Orientation Provided & Reviewed with Pt/Caregiver Including Roles & Responsibilities: Yes   Decrease burden of Care through IP rehab admission: n/a  Possible need for SNF placement upon discharge:if pt fails to progress as expected, she is open to SNF placement after CIR . She liked [facility] SNF. Returned home June after ankle fracture.  Patient Condition: This patient's condition remains as documented in the consult dated 07/30/2014, in which the Rehabilitation Physician determined and documented that the patient's condition is appropriate for intensive rehabilitative care in an inpatient rehabilitation facility. Will admit to inpatient rehab today.  Preadmission Screen Completed By: Cleatrice Burke, 07/31/2014 10:41 AM ______________________________________________________________________  Discussed status with Dr. Naaman Plummer on 07/31/2014 at 1040 and received telephone approval for admission today.  Admission Coordinator: Cleatrice Burke, time 9292 Date 07/31/2014.         Cosigned by: Meredith Staggers, MD at 07/31/2014 10:46 AM  Revision History     Date/Time User Provider Type Action   07/31/2014 10:46 AM Meredith Staggers, MD Physician Cosign   07/31/2014 10:41 AM Cleatrice Burke, RN Rehab Admission Coordinator Sign

## 2014-07-31 NOTE — Progress Notes (Signed)
I met with pt at bedside. We discussed an inpt rehab admission and she is in agreement. I will arrange. I have discussed with Verdis Prime and notified RN [place] and [place]. [phone]

## 2014-07-31 NOTE — Progress Notes (Signed)
Physical Therapy Treatment Patient Details Name: Toni [name] MRN: [id number] DOB: 1946-05-01 Today's Date: 07/31/2014    History of Present Illness Adm 07/29/14 for emergency baclofen pump replacement (due to failed pump). PMHx- MS, neck surgery, PAF, HTN    PT Comments    Pt eager to work with therapy. After ambulating 6 ft, pt very fatigued and discussed with her needing to be mindful to not overdo her increased activity, as this can cause further weakness/fatigue. She wanted to walk again, however upon standing, she realized she was too weak to do so safely. Educated pt on effects of knee flexion contractures with her walking and importance in proper positioning for prolonged stretching.  Follow Up Recommendations  CIR;Supervision for mobility/OOB     Equipment Recommendations  None recommended by PT    Recommendations for Other Services       Precautions / Restrictions Precautions Precautions: Fall Precaution Comments: watch gait belt placement due to incision (axillae preferable) Restrictions Weight Bearing Restrictions: No    Mobility  Bed Mobility               General bed mobility comments: up on BSC  Transfers Overall transfer level: Needs assistance Equipment used: Rolling walker (2 wheeled) Transfers: Sit to/from Stand Sit to Stand: Min assist         General transfer comment: assist to extend hips and raise shoulders for more erect posture (unable to stand fully upright due to bil knee contractures); x 2  Ambulation/Gait Ambulation/Gait assistance: Mod assist Ambulation Distance (Feet): 6 Feet Assistive device: Rolling walker (2 wheeled) Gait Pattern/deviations: Step-to pattern;Decreased stride length;Shuffle;Trunk flexed;Wide base of support   Gait velocity interpretation: <1.8 ft/sec, indicative of risk for recurrent falls General Gait Details: pt determined to walk although legs tremulous (less than 07/30/14); vc and assist for upright  posture, although pt uses forward flexion to maximize knee extension/ extensor moment at knee   Stairs            Wheelchair Mobility    Modified Rankin (Stroke Patients Only)       Balance     Sitting balance-Leahy Scale: Fair       Standing balance-Leahy Scale: Poor                      Cognition Arousal/Alertness: Awake/alert Behavior During Therapy: WFL for tasks assessed/performed Overall Cognitive Status: Within Functional Limits for tasks assessed                      Exercises Other Exercises Other Exercises: standing x 60 seconds working on upright posture and knee extension Other Exercises: knee presses in recliner with rolled blanket under lower legs for incr knee ROM; x 10 (also educated pt and RN on positioning similarly in bed for passive knee extension stretch; and to avoid knee flexion that is created by bed/mattress position    General Comments        Pertinent Vitals/Pain Pain Assessment: 0-10 Pain Score: 5  Pain Location: abd/mid-section  Pain Descriptors / Indicators: Tightness Pain Intervention(s): Limited activity within patient's tolerance;Monitored during session;Repositioned    Home Living         Home Access: Ramped entrance       Additional Comments: has used [place] in the past    Prior Function            PT Goals (current goals can now be found in the care plan section)  Acute Rehab PT Goals Patient Stated Goal: be able to walk like I did before the surgery Progress towards PT goals: Progressing toward goals    Frequency  Min 4X/week    PT Plan Current plan remains appropriate    Co-evaluation             End of Session Equipment Utilized During Treatment: Gait belt Activity Tolerance: Patient limited by fatigue Patient left: in chair;with call bell/phone within reach;with chair alarm set;with family/visitor present     Time: 1314-3888 PT Time Calculation (min) (ACUTE  ONLY): 16 min  Charges:  $Therapeutic Exercise: 8-22 mins                    G Codes:      Carlita Whitcomb 08-07-14, 12:05 PM Pager [phone]

## 2014-07-31 NOTE — Progress Notes (Signed)
Patient information reviewed and entered into eRehab system by Silveria Botz, RN, CRRN, PPS Coordinator.  Information including medical coding and functional independence measure will be reviewed and updated through discharge.    

## 2014-07-31 NOTE — Progress Notes (Signed)
Discharge orders received, pt for discharge to CIR ,  dressing CDI to R ABD.  D/C instructions and Rx given with verbalized understanding. . Staff brought pt downstairs via wheelchair.

## 2014-07-31 NOTE — PMR Pre-admission (Signed)
PMR Admission Coordinator Pre-Admission Assessment  Patient: Toni [name] is an 68 y.o., female MRN: [id number] DOB: 12/04/1945 Height: 5\' 3"  (160 cm) Weight: 123.378 kg (272 lb)              Insurance Information HMO:     PPO:      PCP:      IPA:      80/20: yes     OTHER: no HMO PRIMARY: medicare a and b      Policy#: [id number]      Subscriber: pt Benefits:  Phone #: palmetto online     Name: 07/30/14 Eff. Date: a 06/21/04 b 04/21/06     Deduct: $1260      Out of Pocket Max: none      Life Max: none CIR: 100%      SNF: 20 full days Outpatient: 80%     Co-Pay: 20% Home Health: 100%      Co-Pay: none DME: 80%     Co-Pay: 20% Providers: pt choice  SECONDARY: [employer] [place]      Policy#: [id number]      Subscriber: pt  Medicaid Application Date:       Case Manager:  Disability Application Date:       Case Worker:   Emergency Contact Information Contact Information    Name Relation Home Work Mobile   Mccarter,Ronald Spouse [phone]  [phone]     Current Medical History  Patient Admitting Diagnosis: MS with pseudo-exacerbation following baclofen pump replacement  History of Present Illness: Toni [name] is a 68 y.o. right handed female with history of atrial fibrillation on chronic Coumadin, multiple sclerosis diagnosed in 1989 followed by neurology services Dr. Brett Fairy. Patient lives with her husband used a walker prior to admission and was independent with assistive device. Presented 07/29/2014 with increased spasticity secondary to multiple sclerosis. Patient has a baclofen pump in place. Underwent baclofen pump replacement 07/29/2014 per Dr. Vertell Limber. Hospital course pain management as well as titration and baclofen to aid in her spasticity. Physical therapy evaluation completed 07/30/2014 with recommendations of physical medicine rehabilitation consult.PT notes plus to assist today compared to supervision with a walker yesterday  Patient states that pump has been  malfunctioning for couple months. It has not given the usual amount of baclofen. She has had to supplement with oral baclofen prescribed by her neurologist, Dr. [name]. Patient has noted hand paresthesias on the right side over the last couple days. She's had more chronic paresthesias in the left hand. No speech or swallowing issues.  Anxious since admission. Reported episode of A fib with low BP 12/10 evening. Noted fluid retention complaints for months.   Past Medical History  Past Medical History  Diagnosis Date  . Multiple sclerosis     has had this may 1989-Dohmeir reg doc  . Hypertension   . PAF (paroxysmal atrial fibrillation)     on coumadin; documented on monitor 06/2010  . Thyroid disease   . HLD (hyperlipidemia)   . Shortness of breath   . Pneumonia   . Hyperthyroidism   . Edema     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' ablation of R GSV 2012  . Depression   . Anxiety   . Headache(784.0)     MIGRAINE   . Chronic pain   . Hx of echocardiogram 04/22/10    EF 55-60%, no valve issues  . History of stress test 06/21/10    limited exam with  some degree of breast attenuatin of the apex however a component of apical ischemia is not excluded, EF 62%; cardiac cath   . Sleep apnea     cpap    Family History  family history includes Alcoholism in her son; Cancer in her paternal grandmother; Coronary artery disease in her father and maternal grandmother; Depression in her maternal grandmother and mother.  Prior Rehab/Hospitalizations: broke r ankle 3/15 and received rehab at [facility] for 3 months  Current Medications  Current facility-administered medications: 0.9 %  sodium chloride infusion, 250 mL, Intravenous, Continuous, Erline Levine, MD, 250 mL at 07/30/14 0129;  acetaminophen (TYLENOL) tablet 650 mg, 650 mg, Oral, Q4H PRN **OR** acetaminophen (TYLENOL) suppository 650 mg, 650 mg, Rectal, Q4H PRN, Erline Levine, MD;  ALPRAZolam Duanne Moron) tablet 0.5 mg, 0.5  mg, Oral, BID PRN, Erline Levine, MD alum & mag hydroxide-simeth (MAALOX/MYLANTA) 200-200-20 MG/5ML suspension 30 mL, 30 mL, Oral, Q6H PRN, Erline Levine, MD;  antiseptic oral rinse (CPC / CETYLPYRIDINIUM CHLORIDE 0.05%) solution 7 mL, 7 mL, Mouth Rinse, q12n4p, Erline Levine, MD, 7 mL at 07/30/14 1600;  baclofen (LIORESAL) tablet 20 mg, 20 mg, Oral, TID PRN, Erline Levine, MD;  buPROPion (WELLBUTRIN XL) 24 hr tablet 300 mg, 300 mg, Oral, Daily, Erline Levine, MD, 300 mg at 07/31/14 1013 calcium carbonate (OS-CAL - dosed in mg of elemental calcium) tablet 500 mg of elemental calcium, 1 tablet, Oral, BID WC, Erline Levine, MD, 500 mg of elemental calcium at 07/31/14 0802;  cholecalciferol (VITAMIN D) tablet 2,000 Units, 2,000 Units, Oral, Daily, Erline Levine, MD, 2,000 Units at 07/30/14 1056 dextrose 5 % and 0.45 % NaCl with KCl 20 mEq/L infusion, , Intravenous, Continuous, Erline Levine, MD, Last Rate: 75 mL/hr at 07/30/14 2026, 1,000 mL at 07/30/14 2026;  docusate sodium (COLACE) capsule 100 mg, 100 mg, Oral, BID, Erline Levine, MD, 100 mg at 07/31/14 1013;  famotidine (PEPCID) tablet 10 mg, 10 mg, Oral, BID, Erline Levine, MD, 10 mg at 07/31/14 0544 fluticasone (FLONASE) 50 MCG/ACT nasal spray 1 spray, 1 spray, Each Nare, QHS, Erline Levine, MD, 1 spray at 07/30/14 2147;  furosemide (LASIX) tablet 40 mg, 40 mg, Oral, BID, Erline Levine, MD, 40 mg at 07/31/14 0802;  gabapentin (NEURONTIN) capsule 300 mg, 300 mg, Oral, TID, Erline Levine, MD, 300 mg at 07/31/14 1013 HYDROcodone-acetaminophen (NORCO/VICODIN) 5-325 MG per tablet 1-2 tablet, 1-2 tablet, Oral, Q4H PRN, Erline Levine, MD, 2 tablet at 07/30/14 1056;  lactulose ([place]) 10 GM/15ML solution 10 g, 10 g, Oral, Daily PRN, Erline Levine, MD, 10 g at 07/31/14 1275;  levothyroxine (SYNTHROID, LEVOTHROID) tablet 75 mcg, 75 mcg, Oral, QAC breakfast, Erline Levine, MD, 75 mcg at 07/31/14 0545 loratadine (CLARITIN) tablet 10 mg, 10 mg, Oral, Daily, Erline Levine, MD, 10 mg  at 07/31/14 1013;  menthol-cetylpyridinium (CEPACOL) lozenge 3 mg, 1 lozenge, Oral, PRN **OR** phenol (CHLORASEPTIC) mouth spray 1 spray, 1 spray, Mouth/Throat, PRN, Erline Levine, MD;  morphine 2 MG/ML injection 1-4 mg, 1-4 mg, Intravenous, Q3H PRN, Erline Levine, MD multivitamin with minerals tablet 1 tablet, 1 tablet, Oral, Daily, Erline Levine, MD, 1 tablet at 07/31/14 1013;  nebivolol (BYSTOLIC) tablet 20 mg, 20 mg, Oral, Daily, Erline Levine, MD, 20 mg at 07/30/14 1057;  omega-3 acid ethyl esters (LOVAZA) capsule 1,000 mg, 1,000 mg, Oral, Daily, Erline Levine, MD, 1,000 mg at 07/31/14 1013;  ondansetron (ZOFRAN) injection 4 mg, 4 mg, Intravenous, Q4H PRN, Erline Levine, MD pantoprazole (PROTONIX) EC tablet 40 mg, 40 mg, Oral, Daily, Broadus John  Vertell Limber, MD, 40 mg at 07/31/14 0546;  pneumococcal 23 valent vaccine (PNU-IMMUNE) injection 0.5 mL, 0.5 mL, Intramuscular, Tomorrow-1000, Erline Levine, MD, 0.5 mL at 07/31/14 1000;  polycarbophil (FIBERCON) tablet 625 mg, 625 mg, Oral, BID, Erline Levine, MD, 625 mg at 07/30/14 2145;  simvastatin (ZOCOR) tablet 20 mg, 20 mg, Oral, QHS, Erline Levine, MD, 20 mg at 07/30/14 2146 sodium chloride 0.9 % injection 3 mL, 3 mL, Intravenous, Q12H, Erline Levine, MD, 3 mL at 07/30/14 2149;  sodium chloride 0.9 % injection 3 mL, 3 mL, Intravenous, PRN, Erline Levine, MD;  tamsulosin [facility]) capsule 0.4 mg, 0.4 mg, Oral, Daily, Erline Levine, MD, 0.4 mg at 07/31/14 1013;  Warfarin - Physician Dosing Inpatient, , Does not apply, q1800, Rebecka Apley, RPH;  zolpidem (AMBIEN) tablet 5 mg, 5 mg, Oral, QHS PRN, Erline Levine, MD  Patients Current Diet: Diet Heart  Precautions / Restrictions Precautions Precautions: Fall Precaution Comments: watch gait belt placement due to incision (axillae preferable) Restrictions Weight Bearing Restrictions: No   Prior Activity Level Community (5-7x/wk): in community 2 to 3 times per week; uses w/c in [place] / [place] Devices/Equipment: CPAP, Built-in shower seat, Bedside commode/3-in-1, Environmental consultant (specify type) Home Equipment: Environmental consultant - 2 wheels, Wheelchair - manual, Grab bars - toilet, Shower seat - built in, Hand held shower head, Bedside commode, Grab bars - tub/shower (getting motorized wheelchair from friend; lift chair)  Prior Functional Level Prior Function Level of Independence: Needs assistance Gait / Transfers Assistance Needed: walks 50 ft with RW  Comments: husband is "my angel"   Current Functional Level Cognition  Overall Cognitive Status: Within Functional Limits for tasks assessed Orientation Level: Oriented X4    Extremity Assessment (includes Sensation/Coordination)          ADLs       Mobility  General bed mobility comments: up on BSC, NT    Transfers  Overall transfer level: Needs assistance Equipment used: Rolling walker (2 wheeled) Transfers: Sit to/from Stand, Risk manager Sit to Stand: Min guard, Min assist Stand pivot transfers: Min assist General transfer comment: x 3 with each transfer progressively weaker and more "crouched" with legs and torso flexed    Ambulation / Gait / Stairs / Wheelchair Mobility  Ambulation/Gait General Gait Details: unable due to weakness/decr safety with +1 assist    Posture / Balance      Special needs/care consideration BiPAP/CPAP yes Skin surgical dressing to r lq                            Bowel mgmt: continent Bladder mgmt: continent but wears poise pads due to urgency Has been using oral baclofen since pump dysfunctioning for 4 months   Previous Home Environment Living Arrangements: Spouse/significant other, Children (son, works)  Lives With: Spouse, Son, Other (Comment) (16 yo son lives with her) Available Help at Discharge: Family, Available 24 hours/day Type of Home: House Home Layout: Two level, Able to live on main level with bedroom/bathroom Home Access: Ramped entrance Bathroom Shower/Tub: Walk-in  shower, Door [employer] Toilet: Handicapped height Bathroom Accessibility: Yes How Accessible: Accessible via walker [place]: Other (Comment) Additional Comments: has used [facility] in the past  Discharge Living Setting Plans for Discharge Living Setting: Patient's home, Lives with (comment), Other (Comment) (spouse and 106 yo son lives with them) Type of Home at Discharge: House Discharge Home Layout: Two level, [place]  to live on main level with bedroom/bathroom Discharge Home Access: [place] entrance Discharge Bathroom Shower/Tub: Walk-in shower, Door Discharge Bathroom Toilet: Handicapped height Discharge Bathroom Accessibility: Yes How Accessible: Accessible via walker Does the patient have any problems obtaining your medications?: No  Social/Family/Support Systems Patient Roles: Spouse, Parent Contact Information: Leward Quan Anticipated Caregiver: spouse Anticipated Caregiver's Contact Information: see above Ability/Limitations of Caregiver: spouse can provide min assist.  Caregiver Availability: 24/7 Discharge Plan Discussed with Primary Caregiver: Yes Is Caregiver In Agreement with Plan?: Yes Does Caregiver/Family have Issues with Lodging/Transportation while Pt is in Rehab?: No  Husband assist pt in and out of shower by holding onto her walker as she exits shower. Spouse does all home management and cooking. Pt disabled from [place]. Was Social research officer, government at [employer].  Goals/Additional Needs Patient/Family Goal for Rehab: supervision with PT and OT Expected length of stay: ELOS 7 days Special Service Needs: baclofen pump replacement this admission Pt/Family Agrees to Admission and willing to participate: Yes Program Orientation Provided & Reviewed with Pt/Caregiver Including Roles  & Responsibilities: Yes   Decrease burden of Care through IP rehab admission: n/a  Possible need for SNF placement upon discharge:if pt fails to progress as  expected, she is open to SNF placement after CIR . [facility]. Returned home June after ankle fracture.  Patient Condition: This patient's condition remains as documented in the consult dated 07/30/2014, in which the Rehabilitation Physician determined and documented that the patient's condition is appropriate for intensive rehabilitative care in an inpatient rehabilitation facility. Will admit to inpatient rehab today.  Preadmission Screen Completed By:  Cleatrice Burke, 07/31/2014 10:41 AM ______________________________________________________________________   Discussed status with Dr. Naaman Plummer on 07/31/2014 at  1040 and received telephone approval for admission today.  Admission Coordinator:  Cleatrice Burke, time 2778 Date 07/31/2014.

## 2014-07-31 NOTE — Progress Notes (Signed)
ANTICOAGULATION CONSULT NOTE - Initial Consult  Pharmacy Consult for coumadin Indication: atrial fibrillation  Allergies  Allergen Reactions  . Oxycodone     Oral oxycodone causes vomiting  . Chlorhexidine Gluconate     Sores at site  . Zoloft [Sertraline] Hives, Swelling and Rash    Patient Measurements:   Heparin Dosing Weight:   Vital Signs: Temp: 99.3 F (37.4 C) (12/11 1334) Temp Source: Oral (12/11 1334) BP: 108/44 mmHg (12/11 1334) Pulse Rate: 101 (12/11 1334)  Labs:  Recent Labs  07/31/14 0500  HGB 13.9  HCT 42.8  PLT 179  LABPROT 14.7  INR 1.14    Estimated Creatinine Clearance: 87 mL/min (by C-G formula based on Cr of 0.79).   Medical History: Past Medical History  Diagnosis Date  . Multiple sclerosis     has had this may 1989-Dohmeir reg doc  . Hypertension   . PAF (paroxysmal atrial fibrillation)     on coumadin; documented on monitor 06/2010  . Thyroid disease   . HLD (hyperlipidemia)   . Shortness of breath   . Pneumonia   . Hyperthyroidism   . Edema     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' ablation of R GSV 2012  . Depression   . Anxiety   . Headache(784.0)     MIGRAINE   . Chronic pain   . Hx of echocardiogram 04/22/10    EF 55-60%, no valve issues  . History of stress test 06/21/10    limited exam with some degree of breast attenuatin of the apex however a component of apical ischemia is not excluded, EF 62%; cardiac cath   . Sleep apnea     cpap    Medications:  Scheduled:  . antiseptic oral rinse  7 mL Mouth Rinse q12n4p  . buPROPion  300 mg Oral Daily  . calcium carbonate  1 tablet Oral BID WC  . cholecalciferol  2,000 Units Oral Daily  . docusate sodium  100 mg Oral BID  . famotidine  10 mg Oral BID  . fluticasone  1 spray Each Nare QHS  . furosemide  40 mg Oral BID  . gabapentin  300 mg Oral TID  . [START ON 08/01/2014] levothyroxine  75 mcg Oral QAC breakfast  . loratadine  10 mg Oral Daily  .  multivitamin with minerals  1 tablet Oral Daily  . [START ON 08/01/2014] nebivolol  20 mg Oral Daily  . omega-3 acid ethyl esters  1,000 mg Oral Daily  . [START ON 08/01/2014] pantoprazole  40 mg Oral Daily  . polycarbophil  625 mg Oral BID  . simvastatin  20 mg Oral QHS  . tamsulosin  0.4 mg Oral Daily   Infusions:    Assessment: 68 yo female with hx of afib s/p baclofen pump replacement will be continued on coumadin.  INR 1.14 today. Patient was on coumadin 5 mg po daily prior to admission.    Goal of Therapy:  INR 2-3 Monitor platelets by anticoagulation protocol: Yes   Plan:  - coumadin 5mg  po x1 - INR in am  Victoria Euceda, Tsz-Yin 07/31/2014,2:15 PM

## 2014-08-01 ENCOUNTER — Inpatient Hospital Stay (HOSPITAL_COMMUNITY): Payer: Medicare Other | Admitting: Occupational Therapy

## 2014-08-01 ENCOUNTER — Inpatient Hospital Stay (HOSPITAL_COMMUNITY): Payer: Medicare Other | Admitting: Physical Therapy

## 2014-08-01 DIAGNOSIS — G839 Paralytic syndrome, unspecified: Secondary | ICD-10-CM

## 2014-08-01 LAB — PROTIME-INR
INR: 1.07 (ref 0.00–1.49)
Prothrombin Time: 14 seconds (ref 11.6–15.2)

## 2014-08-01 MED ORDER — WARFARIN SODIUM 7.5 MG PO TABS
7.5000 mg | ORAL_TABLET | Freq: Once | ORAL | Status: AC
  Administered 2014-08-01: 7.5 mg via ORAL
  Filled 2014-08-01: qty 1

## 2014-08-01 NOTE — Progress Notes (Signed)
Subjective/Complaints: 68 y.o. right handed female with history of atrial fibrillation on chronic Coumadin, multiple sclerosis diagnosed in 1989 followed by neurology services Dr. Brett Fairy. Patient lives with her husband used a walker prior to admission and was independent with assistive device. Presented 07/29/2014 with increased spasticity secondary to multiple sclerosis. Patient has a baclofen pump in place. Underwent baclofen pump replacement 07/29/2014 per Dr. Vertell Limber. Hospital course pain management as well as titration and baclofen to aid in her spasticity. Physical therapy evaluation completed 07/30/2014 with recommendations of physical medicine rehabilitation consult.PT notes plus to assist today compared to supervision with a walker yesterday  Patient states that pump has been malfunctioning for couple months. It has not given the usual amount of baclofen. She has had to supplement with oral baclofen prescribed by her neurologist, Dr. [name]  Constipation chronic managed with laxatives Objective: Vital Signs: Blood pressure 121/59, pulse 73, temperature 98 F (36.7 C), temperature source Axillary, resp. rate 20, height 5\' 3"  (1.6 m), weight 119.6 kg (263 lb 10.7 oz), SpO2 96 %. No results found. Results for orders placed or performed during the hospital encounter of 07/31/14 (from the past 72 hour(s))  Protime-INR     Status: None   Collection Time: 08/01/14  5:58 AM  Result Value Ref Range   Prothrombin Time 14.0 11.6 - 15.2 seconds   INR 1.07 0.00 - 1.49      Constitutional: She is oriented to person, place, and time.  HENT:  Head: Normocephalic.  Eyes: EOM are normal.  Neck: Normal range of motion. Neck supple. No thyromegaly present.  Cardiovascular: Normal rate. An irregularly irregular rhythm present.  Respiratory: Effort normal and breath sounds normal. No respiratory distress.  GI: Soft. Bowel sounds are normal. She exhibits no distension. There is no tenderness.   Neurological: She is alert and oriented to person, place, and time.  Reflex Scores:  Patellar reflexes are 0 on the right side and 0 on the left side.  Achilles reflexes are 0 on the right side and 0 on the left side. Follows all commands  Reduced tone in bilateral lower limbs  Skin: Skin is warm and dry.  Motor strength is 5/5 bilateral deltoid, biceps, triceps, grip, 3 minus right hip flexor and knee extensor ankle dorsiflexor and EHL, 4/5 and left hip flexor and knee extensor and ankle dorsiflexor and EHL Sensory intact to light touch in both upper and lower limbs with paresthesias noted in the upper extremities. Cranial nerves II through XII intact Cerebellar testing shows no evidence of dysmetria on finger-nose-finger testing in the upper extremities. Heel shin testing deferred secondary to weakness  Assessment/Plan: 1. Functional deficits secondary to multiple sclerosis with pseudo-exacerbation following baclofen pump replacement which require 3+ hours per day of interdisciplinary therapy in a comprehensive inpatient rehab setting. Physiatrist is providing close team supervision and 24 hour management of active medical problems listed below. Physiatrist and rehab team continue to assess barriers to discharge/monitor patient progress toward functional and medical goals. FIM:                   Comprehension Comprehension Mode: Auditory Comprehension: 7-Follows complex conversation/direction: With no assist  Expression Expression Mode: Verbal Expression: 7-Expresses complex ideas: With no assist  Social Interaction Social Interaction: 7-Interacts appropriately with others - No medications needed.  Problem Solving Problem Solving: 6-Solves complex problems: With extra time  Memory Memory: 7-Complete Independence: No helper  Medical Problem List and Plan: 1. Functional deficits secondary to multiple sclerosis with  pseudo-exacerbation following baclofen  pump replacement 2. DVT Prophylaxis/Anticoagulation: Chronic Coumadin for atrial fibrillation. Monitor for any bleeding episodes 3. Pain Management: Neurontin 300 mg 3 times a day, hydrocodone as needed. Monitor with increased mobility 4. Mood/depression/anxiety: Wellbutrin 300 mg daily, Xanax 0.5 mg twice a day as needed. Provide emotional support 5. Neuropsych: This patient is capable of making decisions on her own behalf. 6. Skin/Wound Care: Routine skin checks 7. Fluids/Electrolytes/Nutrition: Strict I and O's. Follow-up chemistries    8. Hypertension. Lasix 40 mg twice a day, Bystolic 20 mg daily. Monitor with increased mobility 9. Hypothyroidism.Marland Kitchen Synthroid. 10. Hyperlipidemia. 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 Adjust bowel program. Continue Flomax 0.4 mg daily. Check PVRs 3  LOS (Days) 1 A FACE TO FACE EVALUATION WAS PERFORMED  Ameila Weldon E 08/01/2014, 8:28 AM

## 2014-08-01 NOTE — Progress Notes (Signed)
0905 Patient called RN to room this AM.  Patient complains of heart palpitations and mild anxiety. HR irregular, Hx of Afib. Xanax PRN given and MD made aware.

## 2014-08-01 NOTE — Evaluation (Signed)
Physical Therapy Assessment and Plan  Patient Details  Name: Toni [name] MRN: [id number] Date of Birth: 08/26/1945  PT Diagnosis: Abnormal posture, Contracture of joint: B/L knees, Impaired sensation, Muscle weakness and Pain in BLE and abd Rehab Potential: Fair ELOS: 10-12 days  Today's Date: 08/01/2014 PT Individual Time: 1600-1730 PT Individual Time Calculation (min): 90 min    Problem List:  Patient Active Problem List   Diagnosis Date Noted  . MS (multiple sclerosis) 07/29/2014  . Closed right ankle fracture 10/30/2013  . Leucocytosis 10/30/2013  . Ankle fracture 10/30/2013  . Varicose veins of lower extremities with other complications 63/14/9702  . Paroxysmal a-fib 06/04/2013  . Morbid obesity 06/04/2013  . OSA on CPAP 06/04/2013  . Muscle spasms of lower extremity 02/24/2013  . Long term (current) use of anticoagulants 11/05/2012  . 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 Aspiration pneumonia vs Failed outpatient Rx of CAP 11/26/2011  . Hypertension   . Campath-induced atrial fibrillation   . Thyroid disease   . HLD (hyperlipidemia)     Past Medical History:  Past Medical History  Diagnosis Date  . Multiple sclerosis     has had this may 1989-[facility] reg doc  . Hypertension   . PAF (paroxysmal atrial fibrillation)     on coumadin; documented on monitor 06/2010  . Thyroid disease   . HLD (hyperlipidemia)   . Shortness of breath   . Pneumonia   . Hyperthyroidism   . Edema     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' ablation of R GSV 2012  . Depression   . Anxiety   . Headache(784.0)     MIGRAINE   . Chronic pain   . Hx of echocardiogram 04/22/10    EF 55-60%, no valve issues  . History of stress test 06/21/10    limited exam with some degree of breast attenuatin of the apex however a component of apical ischemia is not excluded, EF 62%; cardiac cath   . Sleep apnea     cpap   Past Surgical  History:  Past Surgical History  Procedure Laterality Date  . Cervical fusion      with correction-june 2005  . Infusion pump implantation      baclofen infusion in lower abd  . Venous ablation  12/16/10    radiofreq ablation -Dr Elisabeth Cara and [facility]  . Child births      X22  . Cardiac catheterization  06/22/10    normal coronary arteries, PAF  . Tonsillectomy    . Pain pump revision N/A 07/29/2014    Procedure: Baclofen pump replacement;  Surgeon: Erline Levine, MD;  Location: [place] [facility];  Service: Neurosurgery;  Laterality: N/A;  Baclofen pump replacement    Assessment & Plan Clinical Impression: Patient is a 68 y.o. year old female with recent admission to the hospital on 07/29/14 with for baclofen pump replacement.Patient transferred to CIR on 07/31/2014 .   Patient currently requires min with mobility secondary to muscle weakness and muscle joint tightness and impaired timing and sequencing, unbalanced muscle activation and decreased coordination.  Prior to hospitalization, patient was Mod I with mobility and lived with Spouse, Son, Other (Comment) (53 y/o) in a House home.  Home access is  Ramped entrance.  Patient will benefit from skilled PT intervention to maximize safe functional mobility, minimize fall risk and decrease caregiver burden for planned discharge home with 24 hour assist.  Anticipate patient will  benefit from follow up [place] at discharge.  PT - End of Session Endurance Deficit: Yes Endurance Deficit Description: multiple rest breaks secondary to fatigue during B & D session PT Assessment Rehab Potential (ACUTE/IP ONLY): Fair PT Patient demonstrates impairments in the following area(s): Balance;Motor;Skin Integrity;Pain;Endurance PT Transfers Functional Problem(s): Bed Mobility;Bed to Chair;Car;Furniture;Floor PT Locomotion Functional Problem(s): Ambulation;Wheelchair Mobility;Stairs PT Plan PT Intensity: Minimum of 1-2 x/day ,45 to 90 minutes PT Frequency: 5 out of 7  days PT Treatment/Interventions: Ambulation/gait training;DME/adaptive equipment instruction;UE/LE Strength taining/ROM;Balance/vestibular training;Functional electrical stimulation;UE/LE Coordination activities;Functional mobility training;Visual/perceptual remediation/compensation;Splinting/orthotics;Community reintegration;Neuromuscular re-education;Stair training;Wheelchair propulsion/positioning;Discharge planning;Pain management;Therapeutic Activities;Disease management/prevention;Patient/family education;Therapeutic Exercise PT Transfers Anticipated Outcome(s): Mod I PT Locomotion Anticipated Outcome(s): SBA PT Recommendation Follow Up Recommendations: Home health PT Patient destination: Home Equipment Recommended: To be determined (pt should have most equipment needed for discharge)  Skilled Therapeutic Intervention Pt performs transfers x6 in session. Pt performs sitting and standing dual motor tasks within functional context including reaching, grasping, and weight shifting. Pt educated on rehab plan, safety in mobility, and pacing mobility. Pt performs marching, LAQs, hip abd/add isometrics 2x10. Pt performs gastroc stretch with mobs, heel slides with mobs, hip abd with myofascial release 3x30". Pt educated on positioning for knee ext.  PT Evaluation Precautions/Restrictions Precautions Precautions: Fall Precaution Comments: watch gait belt placement due to incision (axillae preferable) Restrictions Weight Bearing Restrictions: No General Chart Reviewed: Yes Vital SignsTherapy Vitals Temp: 98 F (36.7 C) Temp Source: Oral Pulse Rate: 80 (in sitting at rest, Pulse 62 with activity) Resp: 20 BP: (!) 95/54 mmHg (in sitting, BP 89/55 s/p amb) Patient Position (if appropriate): Sitting Oxygen Therapy SpO2: 99 % O2 Device: Not Delivered Pain Pain Assessment Pain Assessment: 0-10 Pain Score: 3  Pain Location: Leg (B/L and diaphragm) Home Living/Prior Functioning Home  Living Available Help at Discharge: Family;Available 24 hours/day Type of Home: House Home Access: Ramped entrance Home Layout: Two level;Able to live on main level with bedroom/bathroom Additional Comments: has used [place] in the past  Lives With: Spouse;Son;Other (Comment) (54 y/o) Prior Function Level of Independence: Independent with basic ADLs;Requires assistive device for independence;Other (comment);Independent with gait  Able to Take Stairs?: No Driving: No Vocation: Retired Comments: walking with RW and husband providing supervision for tasks Cognition Overall Cognitive Status: Within Functional Limits for tasks assessed Orientation Level: Oriented X4 Safety/Judgment: Appears intact Sensation Sensation Light Touch: Impaired by gross assessment (B/L feet and hands with tingling and loss of sensation, dysthesia) Stereognosis: Not tested Hot/Cold: Appears Intact Additional Comments: Pt reports B carpal tunnel syndrome with  L UE being worse than R Coordination Gross Motor Movements are Fluid and Coordinated: Yes Fine Motor Movements are Fluid and Coordinated: Yes Finger Nose Finger Test: WFLs Motor  Motor Motor: Abnormal postural alignment and control Motor - Skilled Clinical Observations: decreased B LE strength and balance during functional tasks  Mobility Bed Mobility Bed Mobility: Sit to Supine Supine to Sit: With rails;4: Min guard Transfers Sit to Stand: 4: Min guard;With armrests Stand to Sit: 4: Min guard;With armrests Locomotion  Ambulation Ambulation: Yes Ambulation Distance (Feet): 6 Feet Assistive device: Rolling walker (with cues for pacing and posture) Stairs / Additional Locomotion Stairs: No (unsafe as pt has not attempted in years)  Trunk/Postural Assessment  Cervical Assessment Cervical Assessment: Exceptions to [facility] (forward head) Thoracic Assessment Thoracic Assessment: Exceptions to [facility] Lumbar Assessment Lumbar Assessment:  Exceptions to [facility] Postural Control Postural Control: Deficits on evaluation Righting Reactions: delayed Protective Responses: delayed  Balance Balance Balance  Assessed: Yes Static Sitting Balance Static Sitting - Level of Assistance: 5: Stand by assistance Dynamic Sitting Balance Dynamic Sitting - Level of Assistance: 5: Stand by assistance Static Standing Balance Static Standing - Level of Assistance: 4: Min assist Dynamic Standing Balance Dynamic Standing - Balance Support: Right upper extremity supported;Left upper extremity supported;During functional activity Dynamic Standing - Level of Assistance: 4: Min assist Dynamic Standing - Balance Activities: Other (comment) Extremity Assessment  RUE Assessment RUE Assessment: Exceptions to [facility] RUE AROM (degrees) Overall AROM Right Upper Extremity: Within functional limits for tasks performed RUE Overall AROM Comments: pt unable to fully extend R elbow RUE Strength RUE Overall Strength: Within Functional Limits for tasks performed RUE Overall Strength Comments: grossly 4/5 LUE Assessment LUE Assessment: Exceptions to WFL LUE AROM (degrees) Overall AROM Left Upper Extremity: Within functional limits for tasks assessed LUE Strength LUE Overall Strength: Deficits LUE Overall Strength Comments: grossly 4/5 RLE Assessment RLE Assessment: Exceptions to [facility] RLE AROM (degrees) Overall AROM Right Lower Extremity: Within functional limits for tasks assessed RLE Overall AROM Comments: -5 degrees knee ext RLE Strength RLE Overall Strength: Deficits RLE Overall Strength Comments: grossly 4/5 LLE AROM (degrees) Overall AROM Left Lower Extremity: Within functional limits for tasks assessed LLE Overall AROM Comments: - 5 degrees knee ext LLE Strength LLE Overall Strength: Deficits LLE Overall Strength Comments: grossly 4/5  FIM:  FIM - Control and instrumentation engineer Devices: Walker;Bed rails Bed/Chair Transfer: 5:  Supine > Sit: Supervision (verbal cues/safety issues);4: Bed > Chair or W/C: Min A (steadying Pt. > 75%)   Refer to Care Plan for Long Term Goals  Recommendations for other services: None  Discharge Criteria: Patient will be discharged from PT if patient refuses treatment 3 consecutive times without medical reason, if treatment goals not met, if there is a change in medical status, if patient makes no progress towards goals or if patient is discharged from hospital.  The above assessment, treatment plan, treatment alternatives and goals were discussed and mutually agreed upon: by patient and by family  Monia Pouch 08/01/2014, 5:50 PM

## 2014-08-01 NOTE — Progress Notes (Signed)
Occupational Therapy Assessment and Plan  Patient Details  Name: Toni [name] MRN: [id number] Date of Birth: August 06, 1946  OT Diagnosis: abnormal posture and muscle weakness (generalized) Rehab Potential: Rehab Potential (ACUTE ONLY): Good ELOS: 10-12 days   Today's Date: 08/01/2014 OT Individual Time: 3818-2993 and 1300-1330 OT Individual Time Calculation (min): 60 min and  30 min     Problem List:  Patient Active Problem List   Diagnosis Date Noted  . MS (multiple sclerosis) 07/29/2014  . Closed right ankle fracture 10/30/2013  . Leucocytosis 10/30/2013  . Ankle fracture 10/30/2013  . Varicose veins of lower extremities with other complications 71/69/6789  . Paroxysmal a-fib 06/04/2013  . Morbid obesity 06/04/2013  . OSA on CPAP 06/04/2013  . Muscle spasms of lower extremity 02/24/2013  . Long term (current) use of anticoagulants 11/05/2012  . 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 Aspiration pneumonia vs Failed outpatient Rx of CAP 11/26/2011  . Hypertension   . Campath-induced atrial fibrillation   . Thyroid disease   . HLD (hyperlipidemia)     Past Medical History:  Past Medical History  Diagnosis Date  . Multiple sclerosis     has had this may 1989-[facility] reg doc  . Hypertension   . PAF (paroxysmal atrial fibrillation)     on coumadin; documented on monitor 06/2010  . Thyroid disease   . HLD (hyperlipidemia)   . Shortness of breath   . Pneumonia   . Hyperthyroidism   . Edema     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' ablation of R GSV 2012  . Depression   . Anxiety   . Headache(784.0)     MIGRAINE   . Chronic pain   . Hx of echocardiogram 04/22/10    EF 55-60%, no valve issues  . History of stress test 06/21/10    limited exam with some degree of breast attenuatin of the apex however a component of apical ischemia is not excluded, EF 62%; cardiac cath   . Sleep apnea     cpap   Past  Surgical History:  Past Surgical History  Procedure Laterality Date  . Cervical fusion      with correction-june 2005  . Infusion pump implantation      baclofen infusion in lower abd  . Venous ablation  12/16/10    radiofreq ablation -Dr Elisabeth Cara and [facility]  . Child births      X42  . Cardiac catheterization  06/22/10    normal coronary arteries, PAF  . Tonsillectomy    . Pain pump revision N/A 07/29/2014    Procedure: Baclofen pump replacement;  Surgeon: Erline Levine, MD;  Location: [place] [facility];  Service: Neurosurgery;  Laterality: N/A;  Baclofen pump replacement    Assessment & Plan Clinical Impression: Patient is a 68 y.o. year old female right handed female with history of atrial fibrillation on chronic Coumadin, multiple sclerosis diagnosed in 1989 followed by neurology services Dr. Brett Fairy. Patient lives with her husband used a walker prior to admission and was independent with assistive device. Presented 07/29/2014 with increased spasticity secondary to multiple sclerosis. Patient has a baclofen pump in place and stated he had been malfunctioning for a couple of months not giving the usual dose of baclofen. Underwent baclofen pump replacement 07/29/2014 per Dr. Vertell Limber. Hospital course pain management as well as titration and baclofen to aid in her spasticity. Physical therapy evaluation completed 07/30/2014 with recommendations of physical medicine rehabilitation  consult. Patient was admitted for comprehensive rehabilitation program. Patient transferred to CIR on 07/31/2014 .    Patient currently requires Min A - total A with basic self-care skills secondary to muscle weakness, decreased cardiorespiratoy endurance and decreased standing balance and decreased balance strategies.  Prior to hospitalization, patient could complete self care  with Mod I - supervision per pt report.  Patient will benefit from skilled intervention to increase independence with basic self-care skills prior to  discharge home with care partner.  Anticipate patient will require intermittent supervision and OT follow up.      Skilled Therapeutic Intervention Session 1: Pt supine in bed upon entering the room and reports calling for RN prior to arrival as pt reports, "I think I am in A - FIb". RN arriving and taking vitals which appeared to be within normal range. Pt admitting that she may also be anxious secondary to new surroundings and staff. RN provided medication for anxiety before leaving room. Pt agreeable to participate in OT session. OT educated pt on OT purpose, POC, and goals with pt verbalizing understanding. Pt engaged in B & D session seated on EOB with set up A - total A. Transfer from bed to [facility] for toileting with Min A and use of RW with B knees noted to be in slight flexion contracture. Toilet with Max A as pt able to perform 1/3 parts of task. Pt requiring multiple rest breaks secondary to fatigue but O2 remaining above 95% and HR ranging from 78-90 bpm. Pt seated in wheelchair with leg rest donned and call bell within reach upon exiting the room.  Session 2: Upon entering the room, pt seated in wheelchair with no c/o pain. OT educated and demonstrated B UE strengthening exercises for increased strength for functional tasks. Pt returned demonstrations with verbal cues for proper technique of 3 sets of 20 reps of chest pulls, lateral pull downs, bicep curls, and shoulder diagonals with level 1 resistive band. Pt remained seated in wheelchair with call bell within reach upon exiting the room.  OT Evaluation Precautions/Restrictions  Precautions Precautions: Fall Precaution Comments: watch gait belt placement due to incision (axillae preferable) Restrictions Weight Bearing Restrictions: No Pain Pain Assessment Pain Assessment: 0  Home Living/Prior Functioning Home Living Family/patient expects to be discharged to:: Private residence Living Arrangements: Spouse/significant other,  Children Available Help at Discharge: Family, Available 24 hours/day Type of Home: House Home Access: Ramped entrance Home Layout: Two level, Able to live on main level with bedroom/bathroom Additional Comments: has used [place] in the past  Lives With: Spouse, Son, Other (Comment) (27 y/o) IADL History Homemaking Responsibilities: No Current License: No Occupation: On disability Type of Occupation: Education officer, museum  Prior Function Level of Independence: Independent with basic ADLs, Requires assistive device for independence, Other (comment), Independent with gait  Able to Take Stairs?: No Driving: No Vocation: Retired Comments: walking with RW and husband providing supervision for tasks Vision/Perception  Vision- History Baseline Vision/History: Wears glasses Wears Glasses: Reading only Patient Visual Report: No change from baseline Vision- Assessment Vision Assessment?: No apparent visual deficits  Cognition Overall Cognitive Status: Within Functional Limits for tasks assessed Orientation Level: Oriented X4 Safety/Judgment: Appears intact Sensation Sensation Light Touch: Impaired by gross assessment (B/L feet and hands with tingling and loss of sensation, dysthesia) Coordination Gross Motor Movements are Fluid and Coordinated: Yes Fine Motor Movements are Fluid and Coordinated: Yes Motor  Motor Motor: Abnormal postural alignment and control Mobility  Bed Mobility Bed Mobility:  Supine to Sit Supine to Sit: 5: Supervision;With rails Transfers Transfers: Sit to Stand;Stand to Sit Sit to Stand: 4: Min guard;With armrests Stand to Sit: 4: Min guard;With armrests  Trunk/Postural Assessment  Cervical Assessment Cervical Assessment: Exceptions to [facility] (grossly hypomobile) Thoracic Assessment Thoracic Assessment: Exceptions to [facility] (grossly hypomobile) Lumbar Assessment Lumbar Assessment: Exceptions to [facility] (grossly hypomobile)  Balance Balance Balance Assessed:  Yes Static Sitting Balance Static Sitting - Level of Assistance: 5: Stand by assistance Dynamic Sitting Balance Dynamic Sitting - Level of Assistance: 5: Stand by assistance Static Standing Balance Static Standing - Level of Assistance: 4: Min assist Dynamic Standing Balance Dynamic Standing - Level of Assistance: 4: Min assist Extremity/Trunk Assessment RUE Assessment RUE Assessment: Within Functional Limits RUE AROM (degrees) Overall AROM Right Upper Extremity: Within functional limits for tasks performed RUE Overall AROM Comments: pt unable to fully extend R elbow RUE Strength RUE Overall Strength: Within Functional Limits for tasks performed RUE Overall Strength Comments: gross strength 3+/5 throughout LUE Assessment LUE Assessment: Exceptions to [facility] LUE AROM (degrees) Overall AROM Left Upper Extremity: Within functional limits for tasks assessed LUE Strength LUE Overall Strength: Deficits LUE Overall Strength Comments: grossly 4/5  FIM:  FIM - Grooming Grooming Steps: Wash, rinse, dry face;Wash, rinse, dry hands;Oral care, brush teeth, clean dentures;Brush, comb hair Grooming: 5: Set-up assist to obtain items FIM - Bathing Bathing Steps Patient Completed: Chest;Right Arm;Left Arm;Abdomen;Right upper leg;Left upper leg Bathing: 3: Mod-Patient completes 5-7 [id number] parts or 50-74% FIM - Upper Body Dressing/Undressing Upper body dressing/undressing steps patient completed: Thread/unthread right bra strap;Thread/unthread left bra strap;Thread/unthread right sleeve of pullover shirt/dresss;Thread/unthread left sleeve of pullover shirt/dress;Put head through opening of pull over shirt/dress;Pull shirt over trunk Upper body dressing/undressing: 4: Min-Patient completed 75 plus % of tasks FIM - Lower Body Dressing/Undressing Lower body dressing/undressing: 1: Total-Patient completed less than 25% of tasks FIM - Toileting Toileting steps completed by patient: Adjust clothing prior to  toileting Toileting: 2: Max-Patient completed 1 of 3 steps FIM - BControl and instrumentation engineerDevices: WEnvironmental consultantBed rails Bed/Chair Transfer: 5: Supine > Sit: Supervision (verbal cues/safety issues);4: Bed > Chair or W/C: Min A (steadying Pt. > 75%) FIM - TRadio producerDevices: BNurse, learning disabilityTransfers: 4-To toilet/BSC: Min A (steadying Pt. > 75%);4-From toilet/BSC: Min A (steadying Pt. > 75%) FIM - Tub/Shower Transfers Tub/shower Transfers: 0-Activity did not occur or was simulated   Refer to Care Plan for Long Term Goals  Recommendations for other services: None  Discharge Criteria: Patient will be discharged from OT if patient refuses treatment 3 consecutive times without medical reason, if treatment goals not met, if there is a change in medical status, if patient makes no progress towards goals or if patient is discharged from hospital.  The above assessment, treatment plan, treatment alternatives and goals were discussed and mutually agreed upon: by patient  PPhineas Semen12/07/2014, 4:55 PM

## 2014-08-01 NOTE — Progress Notes (Signed)
[place] for coumadin Indication: atrial fibrillation  Allergies  Allergen Reactions  . Oxycodone     Oral oxycodone causes vomiting  . Chlorhexidine Gluconate     Sores at site  . Zoloft [Sertraline] Hives, Swelling and Rash    Patient Measurements: Height: 5\' 3"  (160 cm) Weight: 263 lb 10.7 oz (119.6 kg) IBW/kg (Calculated) : 52.4  Vital Signs: Temp: 98 F (36.7 C) (12/12 0627) Temp Source: Axillary (12/12 0627) BP: 121/59 mmHg (12/12 0627) Pulse Rate: 73 (12/12 0627)  Labs:  Recent Labs  07/31/14 0500 08/01/14 0558  HGB 13.9  --   HCT 42.8  --   PLT 179  --   LABPROT 14.7 14.0  INR 1.14 1.07    Estimated Creatinine Clearance: 85.4 mL/min (by C-G formula based on Cr of 0.79).  Assessment: 68 yo female with hx of afib s/p baclofen pump replacement who is continuing on Coumadin.  INR 1.07 today. Hgb and plts WNL, no bleeding noted. Patient was on Coumadin 5 mg po daily prior to admission.    Goal of Therapy:  INR 2-3 Monitor platelets by anticoagulation protocol: Yes   Plan:  - coumadin 7.5mg  po x1 tonight  - INR daily - follow for s/s bleeding  Destanee Bedonie D. Marchello Rothgeb, PharmD, BCPS Clinical Pharmacist Pager: [phone] 08/01/2014 8:49 AM

## 2014-08-02 ENCOUNTER — Inpatient Hospital Stay (HOSPITAL_COMMUNITY): Payer: Medicare Other

## 2014-08-02 ENCOUNTER — Inpatient Hospital Stay (HOSPITAL_COMMUNITY): Payer: Medicare Other | Admitting: Physical Therapy

## 2014-08-02 LAB — PROTIME-INR
INR: 1.29 (ref 0.00–1.49)
PROTHROMBIN TIME: 16.2 s — AB (ref 11.6–15.2)

## 2014-08-02 MED ORDER — CEPHALEXIN 250 MG PO CAPS
250.0000 mg | ORAL_CAPSULE | Freq: Every day | ORAL | Status: DC
  Administered 2014-08-02 – 2014-08-05 (×4): 250 mg via ORAL
  Filled 2014-08-02 (×5): qty 1

## 2014-08-02 MED ORDER — LACTULOSE 10 GM/15ML PO SOLN
10.0000 g | Freq: Two times a day (BID) | ORAL | Status: DC | PRN
  Administered 2014-08-02 – 2014-08-10 (×9): 10 g via ORAL
  Filled 2014-08-02 (×10): qty 15

## 2014-08-02 MED ORDER — WARFARIN SODIUM 7.5 MG PO TABS
7.5000 mg | ORAL_TABLET | Freq: Once | ORAL | Status: AC
  Administered 2014-08-02: 7.5 mg via ORAL
  Filled 2014-08-02: qty 1

## 2014-08-02 NOTE — Progress Notes (Signed)
Physical Therapy Session Note  Patient Details  Name: Toni [name] MRN: [id number] Date of Birth: Jul 23, 1946  Today's Date: 08/02/2014 PT Individual Time: 0930-1000 PT Individual Time Calculation (min): 30 min   Short Term Goals: Week 1:  PT Short Term Goal 1 (Week 1): Pt will be supervision for bed mobility PT Short Term Goal 2 (Week 1): Pt will be min guard for transfers PT Short Term Goal 3 (Week 1): Pt will ambulate 87' with Min A PT Short Term Goal 4 (Week 1): Pt will propel w/c 100' with supervision Skilled Therapeutic Interventions/Progress Updates:    Pt demonstrates gradual improvement increasing ambulation distance since eval. Pt still limited in standing and gait activities by poor proximal strength with increased knee flexion that increases further when fatigued. Pt would continue to benefit from skilled PT services to increase functional mobility.  Therapy Documentation Precautions:  Precautions Precautions: Fall Precaution Comments: watch gait belt placement due to incision (axillae preferable) Restrictions Weight Bearing Restrictions: No Vital Signs: Therapy Vitals Pulse Rate: (!) 54 BP: (!) 129/58 mmHg Patient Position (if appropriate):  (s/p activity) Oxygen Therapy SpO2: 96 % O2 Device: Not Delivered Pain: Pain Assessment Pain Assessment: No/denies pain Mobility:  Pt is Min A with transfers with cues for weight shift, safety, and technique. Locomotion : Ambulation Ambulation/Gait Assistance: 4: Min assist 49' and 59' with RW and cues for pacing and posture. Other Treatments:   Pt performs dynamic sitting and standing balance with dual motor tasks including reaching, grasping, and weight shifting within functional context. Pt performs LAQs, marching, hip abd/add AROM 2x10. Pt educated on rehab plan, pacing, and rehab goals. Pt performs transfers 2x5 in session. See FIM for current functional status  Therapy/Group: Individual Therapy  Monia Pouch 08/02/2014, 12:06 PM

## 2014-08-02 NOTE — Progress Notes (Signed)
Occupational Therapy Session Note  Patient Details  Name: Toni [name] MRN: [id number] Date of Birth: [birth year]  Today's Date: 08/02/2014 OT Individual Time: 2952-8413 OT Individual Time Calculation (min): 64 min    Short Term Goals: Week 1:  OT Short Term Goal 1 (Week 1): Pt will perform bathing with Min A in order to increase I in self care.  OT Short Term Goal 2 (Week 1): Pt will perform 2/3 tasks of toileting in order to increase I in self care.  OT Short Term Goal 3 (Week 1): Pt will perform shower transfer with Min A in order to increase I in self care. OT Short Term Goal 4 (Week 1): Pt will engage in 20 minutes of functional task with no more than 1 rest break secondary to fatigue.  Skilled Therapeutic Interventions/Progress Updates: ADL-retraining at shower level with emphasis on transfers, endurance, and dynamic sitting/standing balance.    Pt received seated in w/c awaiting therapist.   With requested assist with toilet transfer prior to bathing and performed squat-pivot transfer to toilet with steadying assist.   Pt voided urine and requested RW to ambulate to tub bench.   With min guard assist, pt ambulated to bench and performed bathing using LH sponge (from home) and grab bar to partially stand to wash buttocks.    Pt recovered to w/c and dressed and groomed at sink, standing supported at sink for assist with pulling up pants and TEDs.    Pt reported variation to BP and pulse which was assessed (HR 96, BP 99/56).    Pt reported fatigue after bathing/dressing prior to admission and requested return to bed to rest.    Pt completed SPT using RW and was left with all needs within reach.    Therapy Documentation Precautions:  Precautions Precautions: Fall Precaution Comments: watch gait belt placement due to incision (axillae preferable) Restrictions Weight Bearing Restrictions: No  Pain: Pain Assessment Pain Assessment: No/denies pain  See FIM for current functional  status  Therapy/Group: Individual Therapy  [place] 08/02/2014, 12:06 PM

## 2014-08-02 NOTE — Progress Notes (Signed)
Subjective/Complaints: 68 y.o. right handed female with history of atrial fibrillation on chronic Coumadin, multiple sclerosis diagnosed in 1989 followed by neurology services Dr. Brett Fairy. Patient lives with her husband used a walker prior to admission and was independent with assistive device. Presented 07/29/2014 with increased spasticity secondary to multiple sclerosis. Patient has a baclofen pump in place. Underwent baclofen pump replacement 07/29/2014 per Dr. Vertell Limber. Hospital course pain management as well as titration and baclofen to aid in her spasticity. Physical therapy evaluation completed 07/30/2014 with recommendations of physical medicine rehabilitation consult.PT notes plus to assist today compared to supervision with a walker yesterday  Concerned about "low number"  On BP, also wants to take lactulose BID (like at home),  Discussed lasix dose Used keflex at home for prophyllaxis Objective: Vital Signs: Blood pressure 120/49, pulse 68, temperature 98.2 F (36.8 C), temperature source Oral, resp. rate 18, height 5\' 3"  (1.6 m), weight 119.6 kg (263 lb 10.7 oz), SpO2 99 %. No results found. Results for orders placed or performed during the hospital encounter of 07/31/14 (from the past 72 hour(s))  Protime-INR     Status: None   Collection Time: 08/01/14  5:58 AM  Result Value Ref Range   Prothrombin Time 14.0 11.6 - 15.2 seconds   INR 1.07 0.00 - 1.49  Protime-INR     Status: Abnormal   Collection Time: 08/02/14  4:38 AM  Result Value Ref Range   Prothrombin Time 16.2 (H) 11.6 - 15.2 seconds   INR 1.29 0.00 - 1.49      Constitutional: She is oriented to person, place, and time.  HENT:  Head: Normocephalic.  Eyes: EOM are normal.  Neck: Normal range of motion. Neck supple. No thyromegaly present.  Cardiovascular: Normal rate. An irregularly irregular rhythm present.  Respiratory: Effort normal and breath sounds normal. No respiratory distress.  GI: Soft. Bowel sounds  are normal. She exhibits no distension. There is no tenderness.  Neurological: She is alert and oriented to person, place, and time.  Reflex Scores:  Patellar reflexes are 0 on the right side and 0 on the left side.  Achilles reflexes are 0 on the right side and 0 on the left side. Follows all commands  Reduced tone in bilateral lower limbs  Skin: Skin is warm and dry.  Motor strength is 5/5 bilateral deltoid, biceps, triceps, grip, 3 minus right hip flexor and knee extensor ankle dorsiflexor and EHL, 4/5 and left hip flexor and knee extensor and ankle dorsiflexor and EHL Sensory intact to light touch in both upper and lower limbs with paresthesias noted in the upper extremities. Cranial nerves II through XII intact Cerebellar testing shows no evidence of dysmetria on finger-nose-finger testing in the upper extremities.   Assessment/Plan: 1. Functional deficits secondary to multiple sclerosis with pseudo-exacerbation following baclofen pump replacement which require 3+ hours per day of interdisciplinary therapy in a comprehensive inpatient rehab setting. Physiatrist is providing close team supervision and 24 hour management of active medical problems listed below. Physiatrist and rehab team continue to assess barriers to discharge/monitor patient progress toward functional and medical goals. FIM: FIM - Bathing Bathing Steps Patient Completed: Chest, Right Arm, Left Arm, Abdomen, Right upper leg, Left upper leg Bathing: 3: Mod-Patient completes 5-7 8f 10 parts or 50-74%  FIM - Upper Body Dressing/Undressing Upper body dressing/undressing steps patient completed: Thread/unthread right bra strap, Thread/unthread left bra strap, Thread/unthread right sleeve of pullover shirt/dresss, Thread/unthread left sleeve of pullover shirt/dress, Put head through opening of pull  over shirt/dress, Pull shirt over trunk Upper body dressing/undressing: 4: Min-Patient completed 75 plus % of tasks FIM  - Lower Body Dressing/Undressing Lower body dressing/undressing: 1: Total-Patient completed less than 25% of tasks  FIM - Toileting Toileting steps completed by patient: Adjust clothing prior to toileting Toileting: 2: Max-Patient completed 1 of 3 steps  FIM - Radio producer Devices: Engineer, civil (consulting), Insurance account manager Transfers: 4-To toilet/BSC: Min A (steadying Pt. > 75%), 4-From toilet/BSC: Min A (steadying Pt. > 75%)  FIM - Bed/Chair Transfer Bed/Chair Transfer Assistive Devices: Walker, Bed rails Bed/Chair Transfer: 4: Bed > Chair or W/C: Min A (steadying Pt. > 75%), 4: Sit > Supine: Min A (steadying pt. > 75%/lift 1 leg)  FIM - Locomotion: Wheelchair Distance: 75'  Locomotion: Wheelchair: 1: Travels less than 50 ft with supervision, cueing or coaxing FIM - Locomotion: Ambulation Locomotion: Ambulation Assistive Devices: Administrator Ambulation/Gait Assistance: 4: Min assist Locomotion: Ambulation: 1: Travels less than 50 ft with minimal assistance (Pt.>75%)  Comprehension Comprehension Mode: Auditory Comprehension: 7-Follows complex conversation/direction: With no assist  Expression Expression Mode: Verbal Expression: 7-Expresses complex ideas: With no assist  Social Interaction Social Interaction: 7-Interacts appropriately with others - No medications needed.  Problem Solving Problem Solving: 6-Solves complex problems: With extra time  Memory Memory: 7-Complete Independence: No helper  Medical Problem List and Plan: 1. Functional deficits secondary to multiple sclerosis with pseudo-exacerbation following baclofen pump replacement 2. DVT Prophylaxis/Anticoagulation: Chronic Coumadin for atrial fibrillation. Monitor for any bleeding episodes 3. Pain Management: Neurontin 300 mg 3 times a day, hydrocodone as needed. Monitor with increased mobility 4. Mood/depression/anxiety: Wellbutrin 300 mg daily, Xanax 0.5 mg twice a day as needed.  Provide emotional support 5. Neuropsych: This patient is capable of making decisions on her own behalf. 6. Skin/Wound Care: Routine skin checks 7. Fluids/Electrolytes/Nutrition: Strict I and O's. Follow-up chemistries    8. Hypertension. Lasix 40 mg twice a day, Bystolic 20 mg daily. Monitor with increased mobility 9. Hypothyroidism.Marland Kitchen Synthroid. 10. Hyperlipidemia. 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 Adjust bowel program.allow lactulose bid, resume Keflex QHS Continue Flomax 0.4 mg daily. Check PVRs 3  LOS (Days) 2    A FACE TO FACE EVALUATION WAS PERFORMED  KIRSTEINS,ANDREW E 08/02/2014, 8:56 AM

## 2014-08-02 NOTE — Progress Notes (Signed)
[place] for coumadin Indication: atrial fibrillation  Allergies  Allergen Reactions  . Oxycodone     Oral oxycodone causes vomiting  . Chlorhexidine Gluconate     Sores at site  . Zoloft [Sertraline] Hives, Swelling and Rash    Patient Measurements: Height: 5\' 3"  (160 cm) Weight: 263 lb 10.7 oz (119.6 kg) IBW/kg (Calculated) : 52.4  Vital Signs: Temp: 98.2 F (36.8 C) (12/13 0632) Temp Source: Oral (12/13 0632) BP: 120/49 mmHg (12/13 0632) Pulse Rate: 68 (12/13 0632)  Labs:  Recent Labs  07/31/14 0500 08/01/14 0558 08/02/14 0438  HGB 13.9  --   --   HCT 42.8  --   --   PLT 179  --   --   LABPROT 14.7 14.0 16.2*  INR 1.14 1.07 1.29    Estimated Creatinine Clearance: 85.4 mL/min (by C-G formula based on Cr of 0.79).  Assessment: 68 yo female with hx of afib s/p baclofen pump replacement who is continuing on Coumadin.  INR increased to 1.29 today. Hgb and plts WNL, no bleeding noted. Patient was on Coumadin 5 mg po daily prior to admission.    Goal of Therapy:  INR 2-3 Monitor platelets by anticoagulation protocol: Yes   Plan:  - coumadin 7.5mg  po x1 again tonight  - INR daily - follow for s/s bleeding  Shriya Aker D. Krystan Northrop, PharmD, BCPS Clinical Pharmacist Pager: [phone] 08/02/2014 10:35 AM

## 2014-08-03 ENCOUNTER — Inpatient Hospital Stay (HOSPITAL_COMMUNITY): Payer: Medicare Other | Admitting: Occupational Therapy

## 2014-08-03 ENCOUNTER — Inpatient Hospital Stay (HOSPITAL_COMMUNITY): Payer: Medicare Other

## 2014-08-03 ENCOUNTER — Encounter (HOSPITAL_COMMUNITY): Payer: Medicare Other | Admitting: Occupational Therapy

## 2014-08-03 LAB — CBC WITH DIFFERENTIAL/PLATELET
BASOS PCT: 1 % (ref 0–1)
Basophils Absolute: 0 10*3/uL (ref 0.0–0.1)
Eosinophils Absolute: 0.3 10*3/uL (ref 0.0–0.7)
Eosinophils Relative: 4 % (ref 0–5)
HEMATOCRIT: 43.7 % (ref 36.0–46.0)
HEMOGLOBIN: 14.5 g/dL (ref 12.0–15.0)
LYMPHS ABS: 3.3 10*3/uL (ref 0.7–4.0)
Lymphocytes Relative: 42 % (ref 12–46)
MCH: 30.2 pg (ref 26.0–34.0)
MCHC: 33.2 g/dL (ref 30.0–36.0)
MCV: 91 fL (ref 78.0–100.0)
MONOS PCT: 11 % (ref 3–12)
Monocytes Absolute: 0.8 10*3/uL (ref 0.1–1.0)
NEUTROS ABS: 3.3 10*3/uL (ref 1.7–7.7)
Neutrophils Relative %: 42 % — ABNORMAL LOW (ref 43–77)
Platelets: 197 10*3/uL (ref 150–400)
RBC: 4.8 MIL/uL (ref 3.87–5.11)
RDW: 12.6 % (ref 11.5–15.5)
WBC: 7.7 10*3/uL (ref 4.0–10.5)

## 2014-08-03 LAB — COMPREHENSIVE METABOLIC PANEL
ALBUMIN: 3.2 g/dL — AB (ref 3.5–5.2)
ALK PHOS: 59 U/L (ref 39–117)
ALT: 13 U/L (ref 0–35)
ANION GAP: 10 (ref 5–15)
AST: 15 U/L (ref 0–37)
BILIRUBIN TOTAL: 0.3 mg/dL (ref 0.3–1.2)
BUN: 21 mg/dL (ref 6–23)
CHLORIDE: 101 meq/L (ref 96–112)
CO2: 32 mEq/L (ref 19–32)
Calcium: 9 mg/dL (ref 8.4–10.5)
Creatinine, Ser: 0.85 mg/dL (ref 0.50–1.10)
GFR calc Af Amer: 80 mL/min — ABNORMAL LOW (ref >90)
GFR, EST NON AFRICAN AMERICAN: 69 mL/min — AB (ref >90)
GLUCOSE: 89 mg/dL (ref 70–99)
Potassium: 3.8 mEq/L (ref 3.7–5.3)
Sodium: 143 mEq/L (ref 137–147)
Total Protein: 6.4 g/dL (ref 6.0–8.3)

## 2014-08-03 LAB — PROTIME-INR
INR: 1.59 — ABNORMAL HIGH (ref 0.00–1.49)
Prothrombin Time: 19.1 seconds — ABNORMAL HIGH (ref 11.6–15.2)

## 2014-08-03 MED ORDER — WARFARIN SODIUM 5 MG PO TABS
5.0000 mg | ORAL_TABLET | Freq: Once | ORAL | Status: AC
  Administered 2014-08-03: 5 mg via ORAL
  Filled 2014-08-03 (×2): qty 1

## 2014-08-03 MED ORDER — FUROSEMIDE 20 MG PO TABS
20.0000 mg | ORAL_TABLET | Freq: Two times a day (BID) | ORAL | Status: DC
  Administered 2014-08-03 – 2014-08-06 (×7): 20 mg via ORAL
  Filled 2014-08-03 (×11): qty 1

## 2014-08-03 MED ORDER — FUROSEMIDE 20 MG PO TABS
20.0000 mg | ORAL_TABLET | Freq: Two times a day (BID) | ORAL | Status: DC
  Filled 2014-08-03 (×2): qty 1

## 2014-08-03 NOTE — Progress Notes (Signed)
Physical Therapy Session Note  Patient Details  Name: Toni [name] MRN: [id number] Date of Birth: Jul 16, 1946  Today's Date: 08/03/2014 PT Individual Time: 0800-0900 PT Individual Time Calculation (min): 60 min   Short Term Goals: Week 1:  PT Short Term Goal 1 (Week 1): Pt will be supervision for bed mobility PT Short Term Goal 2 (Week 1): Pt will be min guard for transfers PT Short Term Goal 3 (Week 1): Pt will ambulate 54' with Min A PT Short Term Goal 4 (Week 1): Pt will propel w/c 100' with supervision  Skilled Therapeutic Interventions/Progress Updates:   Session focused on functional transfers with RW including sit to stands from w/c and from bed as well as toilet transfers with overall steady A to close S, dynamic standing balance for functional tasks for clothing management, gait training with RW x 15' with steady A (B knees flexed (premorbid)) and cues for posture, and w/c propulsion for BUE strengthening and endurance. Pt requires breaks due to orthostatic symptoms (BP 100/47 mmHg and HR ranging 61-90 bpm with activity) and decreased activity tolerance. Therapist added basic cushion to w/c for support while sitting OOB. Left up in w/c with all needs in place.   Therapy Documentation Precautions:  Precautions Precautions: Fall Precaution Comments: watch gait belt placement due to incision (axillae preferable) Restrictions Weight Bearing Restrictions: No  Pain:  c/o discomfort in BLE but denies need for intervention at this time.   See FIM for current functional status  Therapy/Group: Individual Therapy  [name] [facility] 08/03/2014, 9:23 AM

## 2014-08-03 NOTE — Plan of Care (Signed)
LTG for dynamic standing balance and household gait added to PT POC. Also downgraded distance for controlled environment gait to be more in line with pt's baseline mobility status.   [name], PT, DPT.

## 2014-08-03 NOTE — Progress Notes (Signed)
[place] for coumadin Indication: atrial fibrillation  Allergies  Allergen Reactions  . Oxycodone     Oral oxycodone causes vomiting  . Chlorhexidine Gluconate     Sores at site  . Zoloft [Sertraline] Hives, Swelling and Rash    Patient Measurements: Height: 5\' 3"  (160 cm) Weight: 263 lb 10.7 oz (119.6 kg) IBW/kg (Calculated) : 52.4  Vital Signs: Temp: 98.4 F (36.9 C) (12/14 0457) Temp Source: Oral (12/14 0457) BP: 94/56 mmHg (12/14 0935) Pulse Rate: 78 (12/14 0935)  Labs:  Recent Labs  08/01/14 0558 08/02/14 0438 08/03/14 0439  HGB  --   --  14.5  HCT  --   --  43.7  PLT  --   --  197  LABPROT 14.0 16.2* 19.1*  INR 1.07 1.29 1.59*  CREATININE  --   --  0.85    Estimated Creatinine Clearance: 80.4 mL/min (by C-G formula based on Cr of 0.85).  Assessment: 68 yo female on chronic coumadin for hx of afib s/p baclofen pump replacement.  INR increased to 1.59 today. Hgb and plts WNL, no bleeding noted.  Patient was on Coumadin 5 mg po daily prior to admission.    Goal of Therapy:  INR 2-3 Monitor platelets by anticoagulation protocol: Yes   Plan:  - coumadin 5mg  po x1 again tonight  - INR daily - follow for s/s bleeding  Maryanna Shape, PharmD, BCPS  Clinical Pharmacist  Pager: [phone]    08/03/2014 10:32 AM

## 2014-08-03 NOTE — Plan of Care (Signed)
Problem: RH SKIN INTEGRITY Goal: RH STG SKIN FREE OF INFECTION/BREAKDOWN Outcome: Progressing With. Min assisst.  Problem: RH PAIN MANAGEMENT Goal: RH STG PAIN MANAGED AT OR BELOW PT'S PAIN GOAL Outcome: Progressing Less than 3.(1 to 10 scale)

## 2014-08-03 NOTE — Progress Notes (Signed)
Subjective/Complaints: 68 y.o. right handed female with history of atrial fibrillation on chronic Coumadin, multiple sclerosis diagnosed in 1989 followed by neurology services Dr. Brett Fairy. Patient lives with her husband used a walker prior to admission and was independent with assistive device. Presented 07/29/2014 with increased spasticity secondary to multiple sclerosis. Patient has a baclofen pump in place. Underwent baclofen pump replacement 07/29/2014 per Dr. Vertell Limber. Hospital course pain management as well as titration and baclofen to aid in her spasticity. Physical therapy evaluation completed 07/30/2014 with recommendations of physical medicine rehabilitation consult.PT notes plus to assist today compared to supervision with a walker yesterday  Objective: Concerned about low bp's and heart racing at times.  Still feels weak. Wants to know when she'll be home so that family can schedule christmas party  Vital Signs: Blood pressure 125/48, pulse 78, temperature 98.4 F (36.9 C), temperature source Oral, resp. rate 19, height 5' 3" (1.6 m), weight 119.6 kg (263 lb 10.7 oz), SpO2 99 %. No results found. Results for orders placed or performed during the hospital encounter of 07/31/14 (from the past 72 hour(s))  Protime-INR     Status: None   Collection Time: 08/01/14  5:58 AM  Result Value Ref Range   Prothrombin Time 14.0 11.6 - 15.2 seconds   INR 1.07 0.00 - 1.49  Protime-INR     Status: Abnormal   Collection Time: 08/02/14  4:38 AM  Result Value Ref Range   Prothrombin Time 16.2 (H) 11.6 - 15.2 seconds   INR 1.29 0.00 - 1.49  CBC WITH DIFFERENTIAL     Status: Abnormal   Collection Time: 08/03/14  4:39 AM  Result Value Ref Range   WBC 7.7 4.0 - 10.5 K/uL   RBC 4.80 3.87 - 5.11 MIL/uL   Hemoglobin 14.5 12.0 - 15.0 g/dL   HCT 43.7 36.0 - 46.0 %   MCV 91.0 78.0 - 100.0 fL   MCH 30.2 26.0 - 34.0 pg   MCHC 33.2 30.0 - 36.0 g/dL   RDW 12.6 11.5 - 15.5 %   Platelets 197 150 - 400  K/uL   Neutrophils Relative % 42 (L) 43 - 77 %   Neutro Abs 3.3 1.7 - 7.7 K/uL   Lymphocytes Relative 42 12 - 46 %   Lymphs Abs 3.3 0.7 - 4.0 K/uL   Monocytes Relative 11 3 - 12 %   Monocytes Absolute 0.8 0.1 - 1.0 K/uL   Eosinophils Relative 4 0 - 5 %   Eosinophils Absolute 0.3 0.0 - 0.7 K/uL   Basophils Relative 1 0 - 1 %   Basophils Absolute 0.0 0.0 - 0.1 K/uL  Comprehensive metabolic panel     Status: Abnormal   Collection Time: 08/03/14  4:39 AM  Result Value Ref Range   Sodium 143 137 - 147 mEq/L   Potassium 3.8 3.7 - 5.3 mEq/L   Chloride 101 96 - 112 mEq/L   CO2 32 19 - 32 mEq/L   Glucose, Bld 89 70 - 99 mg/dL   BUN 21 6 - 23 mg/dL   Creatinine, Ser 0.85 0.50 - 1.10 mg/dL   Calcium 9.0 8.4 - 10.5 mg/dL   Total Protein 6.4 6.0 - 8.3 g/dL   Albumin 3.2 (L) 3.5 - 5.2 g/dL   AST 15 0 - 37 U/L   ALT 13 0 - 35 U/L   Alkaline Phosphatase 59 39 - 117 U/L   Total Bilirubin 0.3 0.3 - 1.2 mg/dL   GFR calc non Af Wyvonnia Lora  69 (L) >90 mL/min   GFR calc Af Amer 80 (L) >90 mL/min    Comment: (NOTE) The eGFR has been calculated using the CKD EPI equation. This calculation has not been validated in all clinical situations. eGFR's persistently <90 mL/min signify possible Chronic Kidney Disease.    Anion gap 10 5 - 15  Protime-INR     Status: Abnormal   Collection Time: 08/03/14  4:39 AM  Result Value Ref Range   Prothrombin Time 19.1 (H) 11.6 - 15.2 seconds   INR 1.59 (H) 0.00 - 1.49      Constitutional: She is oriented to person, place, and time.  HENT:  Head: Normocephalic.  Eyes: EOM are normal.  Neck: Normal range of motion. Neck supple. No thyromegaly present.  Cardiovascular: Normal rate. An irregularly irregular rhythm present. HR in 70's Respiratory: Effort normal and breath sounds normal. No respiratory distress.  GI: Soft. Bowel sounds are normal. She exhibits no distension. There is no tenderness.  Neurological: She is alert and oriented to person, place, and time.   Reflex Scores:  Patellar reflexes are 0 on the right side and 0 on the left side.  Achilles reflexes are 0 on the right side and 0 on the left side. Follows all commands  Reduced tone in bilateral lower limbs  Skin: Skin is warm and dry.  Motor strength is 5/5 bilateral deltoid, biceps, triceps, grip, 3 minus right hip flexor and knee extensor ankle dorsiflexor and EHL, 4/5 and left hip flexor and knee extensor and ankle dorsiflexor and EHL Sensory intact to light touch in both upper and lower limbs with paresthesias noted in the upper extremities. Cranial nerves II through XII intact Cerebellar testing shows no evidence of dysmetria on finger-nose-finger testing in the upper extremities.   Assessment/Plan: 1. Functional deficits secondary to multiple sclerosis with pseudo-exacerbation following baclofen pump replacement which require 3+ hours per day of interdisciplinary therapy in a comprehensive inpatient rehab setting. Physiatrist is providing close team supervision and 24 hour management of active medical problems listed below. Physiatrist and rehab team continue to assess barriers to discharge/monitor patient progress toward functional and medical goals. FIM: FIM - Bathing Bathing Steps Patient Completed: Chest, Right Arm, Left Arm, Abdomen, Front perineal area, Buttocks, Right upper leg, Left upper leg, Right lower leg (including foot), Left lower leg (including foot) Bathing: 5: Set-up assist to: Obtain items  FIM - Upper Body Dressing/Undressing Upper body dressing/undressing steps patient completed: Thread/unthread right bra strap, Thread/unthread left bra strap, Hook/unhook bra, Thread/unthread right sleeve of pullover shirt/dresss, Thread/unthread left sleeve of pullover shirt/dress, Put head through opening of pull over shirt/dress, Pull shirt over trunk Upper body dressing/undressing: 5: Set-up assist to: Obtain clothing/put away FIM - Lower Body  Dressing/Undressing Lower body dressing/undressing steps patient completed: Thread/unthread right underwear leg, Thread/unthread left underwear leg, Pull underwear up/down, Thread/unthread right pants leg, Thread/unthread left pants leg Lower body dressing/undressing: 4: Min-Patient completed 75 plus % of tasks  FIM - Toileting Toileting steps completed by patient: Adjust clothing prior to toileting, Performs perineal hygiene Toileting: 3: Mod-Patient completed 2 of 3 steps  FIM - Toilet Transfers Toilet Transfers Assistive Devices: Bedside commode, Walker Toilet Transfers: 4-To toilet/BSC: Min A (steadying Pt. > 75%), 4-From toilet/BSC: Min A (steadying Pt. > 75%)  FIM - Bed/Chair Transfer Bed/Chair Transfer Assistive Devices: Walker, Bed rails Bed/Chair Transfer: 4: Bed > Chair or W/C: Min A (steadying Pt. > 75%), 4: Sit > Supine: Min A (steadying pt. > 75%/lift 1 leg)    FIM - Locomotion: Wheelchair Distance: 75'  Locomotion: Wheelchair: 0: Activity did not occur FIM - Locomotion: Ambulation Locomotion: Ambulation Assistive Devices: Walker - Rolling Ambulation/Gait Assistance: 4: Min assist Locomotion: Ambulation: 1: Travels less than 50 ft with minimal assistance (Pt.>75%)  Comprehension Comprehension Mode: Auditory Comprehension: 7-Follows complex conversation/direction: With no assist  Expression Expression Mode: Verbal Expression: 7-Expresses complex ideas: With no assist  Social Interaction Social Interaction: 7-Interacts appropriately with others - No medications needed.  Problem Solving Problem Solving: 6-Solves complex problems: With extra time  Memory Memory: 7-Complete Independence: No helper  Medical Problem List and Plan: 1. Functional deficits secondary to multiple sclerosis with pseudo-exacerbation following baclofen pump replacement  -interrogate pump today, may adjust downward slightly 2. DVT Prophylaxis/Anticoagulation: Chronic Coumadin for atrial  fibrillation. Monitor for any bleeding episodes 3. Pain Management: Neurontin 300 mg 3 times a day, hydrocodone as needed. Monitor with increased mobility 4. Mood/depression/anxiety: Wellbutrin 300 mg daily, Xanax 0.5 mg twice a day as needed. Provide emotional support  -will ask neuropsych to see for anxiety and cognitive assessment 5. Neuropsych: This patient is capable of making decisions on her own behalf. 6. Skin/Wound Care: Routine skin checks 7. Fluids/Electrolytes/Nutrition: Strict I and O's. Follow-up chemistries ok today  8. Hypertension. Lasix 40 mg twice a day---will back off to 20mg bid given lower bp's  - Bystolic 20 mg daily. Monitor with increased mobility 9. Hypothyroidism.. Synthroid. 10. Hyperlipidemia. 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 Adjusedt bowel program.allow lactulose bid,   -resumed Keflex QHS   -Continue Flomax 0.4 mg daily. Check PVRs 3  LOS (Days) 3    A FACE TO FACE EVALUATION WAS PERFORMED  , T 08/03/2014, 8:13 AM    

## 2014-08-03 NOTE — Progress Notes (Signed)
I interrogated the patient's baclofen pump. She has a dual rate schedule 64mcg from 0100 to 0500 nightly and 36mcg the rest of the day. Given her hypotonia and low bp's we adjusted her basal rate down to 31mcg. Observe for activity tolerance. Her new alarm date is 11/14/14.

## 2014-08-03 NOTE — Progress Notes (Signed)
Pt. States that she will place herself on CPAP before going to bed. CPAP is set up at 8cm H2O & pt. Has her nasal pillows from home.

## 2014-08-03 NOTE — Progress Notes (Signed)
Occupational Therapy Session Note  Patient Details  Name: Toni [name] MRN: [id number] Date of Birth: [birth year]  Today's Date: 08/03/2014 OT Individual Time: 1300-1330 OT Individual Time Calculation (min): 30 min   Short Term Goals: Week 1:  OT Short Term Goal 1 (Week 1): Pt will perform bathing with Min A in order to increase I in self care.  OT Short Term Goal 2 (Week 1): Pt will perform 2/3 tasks of toileting in order to increase I in self care.  OT Short Term Goal 3 (Week 1): Pt will perform shower transfer with Min A in order to increase I in self care. OT Short Term Goal 4 (Week 1): Pt will engage in 20 minutes of functional task with no more than 1 rest break secondary to fatigue.  Skilled Therapeutic Interventions/Progress Updates:  Patient resting in w/c upon arrival.  Engaged in LB dressing with focus on ability to doff socks, donn and doff shoes, replaced laces with elastic laces and use of reacher and LH shoe horn.  Patient able to use AE to doff/donn left sock and shoe yet requires additional assistance to done right shoe.  Eager to continue to practice.  Patient reports that her husband generally donns and ties her shoes and that she usually just wears gripper socks at home.  Therapy Documentation Precautions:  Precautions Precautions: Fall Precaution Comments: watch gait belt placement due to incision (axillae preferable) Restrictions Weight Bearing Restrictions: No Pain: No report of pain See FIM for current functional status  Therapy/Group: Individual Therapy  Toni [name] 08/03/2014, 4:45 PM

## 2014-08-03 NOTE — IPOC Note (Signed)
Overall Plan of Care [facility]) Patient Details Name: Toni [name] MRN: [id number] DOB: 03/12/1946  Admitting Diagnosis: STERN MS BACLOFEN PUMP REPLACEMENT  Hospital Problems: Principal Problem:   MS (multiple sclerosis) Active Problems:   Paroxysmal a-fib   Morbid obesity     Functional Problem List: Nursing Medication Management, Endurance, Bladder, Safety, Pain, Skin Integrity  PT Balance, Motor, Skin Integrity, Pain, Endurance  OT Balance, Endurance, Motor, Safety  SLP    TR         Basic ADL's: OT Grooming, Bathing, Dressing, Toileting     Advanced  ADL's: OT  (n/a)     Transfers: PT Bed Mobility, Bed to Chair, Car, [employer], Futures trader, Metallurgist: PT Ambulation, Emergency planning/management officer, Stairs     Additional Impairments: OT None  SLP        TR      Anticipated Outcomes Item Anticipated Outcome  Self Feeding n/a  Swallowing      Basic self-care  Mod I - S  Toileting  Mod I   Bathroom Transfers Mod I - S  Bowel/Bladder  Min Assist  Transfers  Mod I  Locomotion  SBA  Communication     Cognition     Pain  <3, on 0-10 scale.  Safety/Judgment  Min Assist   Therapy Plan: PT Intensity: Minimum of 1-2 x/day ,45 to 90 minutes PT Frequency: 5 out of 7 days PT Duration Estimated Length of Stay: 10-12 days OT Intensity: Minimum of 1-2 x/day, 45 to 90 minutes OT Frequency: 5 out of 7 days OT Duration/Estimated Length of Stay: 10-12 days         Team Interventions: Nursing Interventions Patient/Family Education, Bladder Management, Pain Management, Disease Management/Prevention, Medication Management, Skin Care/Wound Management, Discharge Planning, Psychosocial Support  PT interventions Ambulation/gait training, DME/adaptive equipment instruction, UE/LE Strength taining/ROM, Balance/vestibular training, Functional electrical stimulation, UE/LE Coordination activities, Functional mobility training, Visual/perceptual  remediation/compensation, Splinting/orthotics, Community reintegration, Neuromuscular re-education, IT trainer, Wheelchair propulsion/positioning, Discharge planning, Pain management, Therapeutic Activities, Disease management/prevention, Barrister's clerk education, Therapeutic Exercise  OT Interventions Training and development officer, Discharge planning, Self Care/advanced ADL retraining, Therapeutic Activities, UE/LE Coordination activities, Functional mobility training, Patient/family education, Therapeutic Exercise, DME/adaptive equipment instruction, Psychosocial support, UE/LE Strength taining/ROM  SLP Interventions    TR Interventions    SW/CM Interventions Discharge Planning, Psychosocial Support, Patient/Family Education    Team Discharge Planning: Destination: PT-Home ,OT- Home , SLP-  Projected Follow-up: PT-Home health PT, OT-  Outpatient OT, Home health OT, SLP-  Projected Equipment Needs: PT-To be determined (pt should have most equipment needed for discharge), OT- To be determined, SLP-  Equipment Details: PT- , OT-Pt reports owning RW, 3 in 1 commode, sock aide, reacher, LG sponge  Patient/family involved in discharge planning: PT- Patient,  OT-Patient, SLP-   MD ELOS: 7-9d Medical Rehab Prognosis:  Excellent Assessment: 68 y.o. right handed female with history of atrial fibrillation on chronic Coumadin, multiple sclerosis diagnosed in 1989 followed by neurology services Dr. Brett Fairy. Patient lives with her husband used a walker prior to admission and was independent with assistive device. Presented 07/29/2014 with increased spasticity secondary to multiple sclerosis. Patient has a baclofen pump in place. Underwent baclofen pump replacement 07/29/2014 per Dr. Vertell Limber    See Team Conference Notes for weekly updates to the plan of care

## 2014-08-03 NOTE — Progress Notes (Signed)
Occupational Therapy Session Notes  Patient Details  Name: Toni [name] MRN: [id number] Date of Birth: [birth year]  Today's Date: 08/03/2014  Short Term Goals: Week 1:  OT Short Term Goal 1 (Week 1): Pt will perform bathing with Min A in order to increase I in self care.  OT Short Term Goal 2 (Week 1): Pt will perform 2/3 tasks of toileting in order to increase I in self care.  OT Short Term Goal 3 (Week 1): Pt will perform shower transfer with Min A in order to increase I in self care. OT Short Term Goal 4 (Week 1): Pt will engage in 20 minutes of functional task with no more than 1 rest break secondary to fatigue.  Skilled Therapeutic Interventions/Progress Updates:   Session #1 0930-1030 - 60 Minutes Individual Therapy Patient with 3/10 complaints of pain in BLEs and hands, RN aware.  Patient received seated in w/c with RN present. Patient refused shower at this time and performed ADL retraining at sink level; grooming tasks and dressing only. UB/LB dressing completed and patient used reacher and sock aid in order to increase independence with LB ADLs. Patient then worked on functional ambulation <> BR using RW with minimal assistance from therapist. Therapist encouraged patient to call for assistance and walk <> BR as needed. During session also discussed and educated patient on energy conservation techniques (breaks prn, no aerosol containers, limit hot showers, etc). At end of session, left patient seated in w/c with all needs within reach.   Session #2 [id number] - 25 Minutes Individual Therapy No complaints of pain Patient received seated in w/c. Therapist propelled patient > therapy gym and educated patient on a BUE strengthening HEP (updated goals to incorporate a HEP goal). Patient used 1lb weighted bar and therapist to incorporate free weights and theraband into exercise routine at a later date. Therapist assisted patient back to room and patient with request to use restroom.  Patient ambulated from w/c > BR for toilet transfer. Patient with increased dizziness, BP checked =96/79; Dr aware of BP reading and patient's complaints. Patient then transferred toilet seat > w/c and therapist assisted patient back beside her bed. Left patient seated in w/c with all needs within reach. Patient stated her endurance is much better in the am, this was evident during this pm session.   Precautions:  Precautions Precautions: Fall Precaution Comments: watch gait belt placement due to incision (axillae preferable) Restrictions Weight Bearing Restrictions: No  See FIM for current functional status  Toni [name] 08/03/2014, 7:26 AM

## 2014-08-03 NOTE — Care Management Note (Signed)
Inpatient Rehabilitation Center Individual Statement of Services  Patient Name:  Toni [name]  Date:  08/03/2014  Welcome to the [facility].  Our goal is to provide you with an individualized program based on your diagnosis and situation, designed to meet your specific needs.  With this comprehensive rehabilitation program, you will be expected to participate in at least 3 hours of rehabilitation therapies Monday-Friday, with modified therapy programming on the weekends.  Your rehabilitation program will include the following services:  Physical Therapy (PT), Occupational Therapy (OT), 24 hour per day rehabilitation nursing, Therapeutic Recreaction (TR), Case Management (Social Worker), Rehabilitation Medicine, Nutrition Services and Pharmacy Services  Weekly team conferences will be held on Tuesdays to discuss your progress.  Your Social Worker will talk with you frequently to get your input and to update you on team discussions.  Team conferences with you and your family in attendance may also be held.  Expected length of stay: 10-12 days  Overall anticipated outcome: supervision/ modified independent  Depending on your progress and recovery, your program may change. Your Social Worker will coordinate services and will keep you informed of any changes. Your Social Worker's name and contact numbers are listed  below.  The following services may also be recommended but are not provided by the [facility] will be made to provide these services after discharge if needed.  Arrangements include referral to agencies that provide these services.  Your insurance has been verified to be:  Medicare and [place] Your primary doctor is:  Dr. Dagmar Hait  Pertinent information will be shared with your doctor and your insurance company.  Social Worker:   [place], [place] or ([phone]   Information discussed with and copy given to patient by: Lennart Pall, 08/03/2014, 5:14 PM

## 2014-08-04 ENCOUNTER — Inpatient Hospital Stay (HOSPITAL_COMMUNITY): Payer: Medicare Other

## 2014-08-04 ENCOUNTER — Telehealth: Payer: Self-pay | Admitting: Neurology

## 2014-08-04 ENCOUNTER — Encounter (HOSPITAL_COMMUNITY): Payer: Medicare Other | Admitting: Occupational Therapy

## 2014-08-04 DIAGNOSIS — R258 Other abnormal involuntary movements: Secondary | ICD-10-CM

## 2014-08-04 LAB — PROTIME-INR
INR: 1.68 — AB (ref 0.00–1.49)
Prothrombin Time: 20 seconds — ABNORMAL HIGH (ref 11.6–15.2)

## 2014-08-04 MED ORDER — WARFARIN SODIUM 7.5 MG PO TABS
7.5000 mg | ORAL_TABLET | Freq: Once | ORAL | Status: AC
  Administered 2014-08-04: 7.5 mg via ORAL
  Filled 2014-08-04: qty 1

## 2014-08-04 MED ORDER — DIPHENHYDRAMINE HCL 25 MG PO CAPS
25.0000 mg | ORAL_CAPSULE | Freq: Four times a day (QID) | ORAL | Status: DC | PRN
  Administered 2014-08-04 (×2): 25 mg via ORAL
  Filled 2014-08-04 (×2): qty 1

## 2014-08-04 MED ORDER — DIPHENHYDRAMINE HCL 50 MG/ML IJ SOLN: 25.0000 mg | Freq: Four times a day (QID) | INTRAMUSCULAR | Status: DC | PRN

## 2014-08-04 NOTE — Progress Notes (Addendum)
Physical Therapy Session Note  Patient Details  Name: Toni [name] MRN: [id number] Date of Birth: 1946-06-03  Today's Date: 08/04/2014 PT Individual Time: 1330-1400 PT Individual Time Calculation (min): 30 min   Short Term Goals: Week 1:  PT Short Term Goal 1 (Week 1): Pt will be supervision for bed mobility PT Short Term Goal 2 (Week 1): Pt will be min guard for transfers PT Short Term Goal 3 (Week 1): Pt will ambulate 49' with Min A PT Short Term Goal 4 (Week 1): Pt will propel w/c 100' with supervision  Skilled Therapeutic Interventions/Progress Updates:    Pt received seated in w/c, agreeable to participate in therapy. Discussed energy conservation with pt and need for rest breaks in between therapies. Pt transferred w/c>bed w/ close S. Session focused on continuing education on home exercise program. Pt performed 2x10 heel slides, SAQ, ankle circles, supine hip abd, quad set/glute set, and x10 SLR. Pt tolerated treatment well, but fatigued at end of session. Pt left supine in bed w/ all needs within reach.   Therapy Documentation Precautions:  Precautions Precautions: Fall Precaution Comments: watch gait belt placement due to incision (axillae preferable) Restrictions Weight Bearing Restrictions: No Pain:   No/denies pain  See FIM for current functional status  Therapy/Group: Individual Therapy  Rada Hay  Rada Hay, PT, DPT 08/04/2014, 7:45 AM

## 2014-08-04 NOTE — Discharge Summary (Signed)
Physician Discharge Summary  Patient ID: Toni [name] MRN: [id number] DOB/AGE: 25-Apr-1946 68 y.o.  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  The patient was gradually mobilized with PT and Rehab Services were consulted.  They felt she would be a good candidate for inpatient Rehab stay.  Consults: rehabilitation medicine  Significant Diagnostic Studies: None  Treatments: surgery: revision of depleted baclofen pump  Discharge Exam: Blood pressure 108/44, pulse 101, temperature 99.3 F (37.4 C), temperature source Oral, resp. rate 20, height 5\' 3"  (1.6 m), weight 123.378 kg (272 lb), SpO2 100 %. Neurologic: Alert and oriented X 3, decreased strength and increased tone. symmetric reflexes. Poor coordination and gait Wound:CDI  Disposition: Rehab      Medication List    ASK your doctor about these medications        ALPRAZolam 0.5 MG tablet  Commonly known as:  XANAX  Take 1 tablet (0.5 mg total) by mouth 2 (two) times daily as needed for anxiety.     baclofen 20 MG tablet  Commonly known as:  LIORESAL  Take 1 tablet (20 mg total) by mouth 3 (three) times daily as needed for muscle spasms.     BENEFIBER PO  Take 1 packet by mouth 2 (two) times daily.     buPROPion 300 MG 24 hr tablet  Commonly known as:  WELLBUTRIN XL  Take 300 mg by mouth daily.     calcium carbonate 600 MG Tabs tablet  Commonly known as:  OS-CAL  Take 600 mg by mouth 2 (two) times daily with a meal.     cephALEXin 250 MG capsule  Commonly known as:  KEFLEX  Take 1 capsule by  mouth daily.     cetirizine 10 MG tablet  Commonly known as:  ZYRTEC  Take 10 mg by mouth daily.     co-enzyme Q-10 30 MG capsule  Take 30 mg by mouth daily.     corticotropin 80 UNIT/ML injectable gel  Commonly known as:  ATHACAR H.P.  5 injection for 5 Days in a row every monthly from October on, 2015 .     docusate sodium 100 MG capsule  Commonly known as:  COLACE  Take 100 mg by mouth 2 (two) times daily.     Fish Oil 600 MG Caps  Take 1,200 mg by mouth daily.     fluticasone 50 MCG/ACT nasal spray  Commonly known as:  FLONASE  Place 1 spray into both nostrils Nightly.     furosemide 40 MG tablet  Commonly known as:  LASIX  Take 40 mg by mouth 2 (two) times daily.     gabapentin 300 MG capsule  Commonly known as:  NEURONTIN  Take 1-2 capsules (300-600 mg total) by mouth 3 (three) times daily as needed. Pain.     GENERLAC 10 GM/15ML Soln  Generic drug:  lactulose (encephalopathy)  1-2 tbsp daily     HYDROcodone-acetaminophen 5-325 MG per tablet  Commonly known as:  NORCO/VICODIN  Take 1 tablet by mouth 2 (two) times daily as needed for moderate pain.     levothyroxine 75 MCG tablet  Commonly known as:  SYNTHROID, LEVOTHROID  Take 75 mcg by mouth daily.     multivitamin with minerals Tabs tablet  Take 1 tablet by mouth daily.     Nebivolol HCl 20 MG Tabs  Take 20 mg by mouth daily.     omeprazole 20 MG capsule  Commonly known as:  PRILOSEC  Take 20 mg by mouth daily.     ranitidine 150 MG tablet  Commonly known as:  ZANTAC  Take 150 mg by mouth at bedtime.     simvastatin 20 MG tablet  Commonly known as:  ZOCOR  Take 20 mg by mouth at bedtime.     tamsulosin 0.4 MG Caps capsule  Commonly known as:  FLOMAX  Take 0.4 mg by mouth daily.     Vitamin D 1000 UNITS capsule  Take 2,000 Units by mouth daily. 2 capsules     warfarin 5 MG tablet  Commonly known as:  COUMADIN  Take 1 tablet (5 mg total) by mouth daily.         Signed: Peggyann Shoals, MD 08/04/2014, 1:53 PM

## 2014-08-04 NOTE — Progress Notes (Signed)
Physical Therapy Session Note  Patient Details  Name: Toni [name] MRN: [id number] Date of Birth: 1946/01/20  Today's Date: 08/04/2014 PT Individual Time: 3762-8315 PT Individual Time Calculation (min): 30 min   Short Term Goals: Week 1:  PT Short Term Goal 1 (Week 1): Pt will be supervision for bed mobility PT Short Term Goal 2 (Week 1): Pt will be min guard for transfers PT Short Term Goal 3 (Week 1): Pt will ambulate 36' with Min A PT Short Term Goal 4 (Week 1): Pt will propel w/c 100' with supervision  Skilled Therapeutic Interventions/Progress Updates:    Session focused on functional gait in pt room with RW to and from bathroom with steady A and performing toileting at supervision level using RW, gait training with RW for distance and endurance x 20' with emphasis on posture and knee extension, and sit to stands and terminal knee extension activity in standing to increase functional strength and improve gait quality. Adjusted RW to a lower height for better fit to decrease shoulder elevation.   Therapy Documentation Precautions:  Precautions Precautions: Fall Precaution Comments: watch gait belt placement due to incision (axillae preferable) Restrictions Weight Bearing Restrictions: No  Pain:  no complaints.  Locomotion : Ambulation Ambulation/Gait Assistance: 4: Min guard;5: Supervision   See FIM for current functional status  Therapy/Group: Individual Therapy  [name] [facility] 08/04/2014, 3:34 PM

## 2014-08-04 NOTE — Progress Notes (Signed)
Subjective/Complaints: 68 y.o. right handed female with history of atrial fibrillation on chronic Coumadin, multiple sclerosis diagnosed in 1989 followed by neurology services Dr. Brett Fairy. Patient lives with her husband used a walker prior to admission and was independent with assistive device. Presented 07/29/2014 with increased spasticity secondary to multiple sclerosis. Patient has a baclofen pump in place. Underwent baclofen pump replacement 07/29/2014 per Dr. Vertell Limber. Hospital course pain management as well as titration and baclofen to aid in her spasticity. Physical therapy evaluation completed 07/30/2014 with recommendations of physical medicine rehabilitation consult.PT notes plus to assist today compared to supervision with a walker yesterday  Objective: Had a reasonable night. Slept better. Wore out yesterday with therapy. bp's were low in afternoon.    Vital Signs: Blood pressure 115/55, pulse 82, temperature 98.4 F (36.9 C), temperature source Oral, resp. rate 18, height _0  (1.6 m), weight 119.6 kg (263 lb 10.7 oz), SpO2 98 %. No results found. Results for orders placed or performed during the hospital encounter of 07/31/14 (from the past 72 hour(s))  Protime-INR     Status: Abnormal   Collection Time: 08/02/14  4:38 AM  Result Value Ref Range   Prothrombin Time 16.2 (H) 11.6 - 15.2 seconds   INR 1.29 0.00 - 1.49  CBC WITH DIFFERENTIAL     Status: Abnormal   Collection Time: 08/03/14  4:39 AM  Result Value Ref Range   WBC 7.7 4.0 - 10.5 K/uL   RBC 4.80 3.87 - 5.11 MIL/uL   Hemoglobin 14.5 12.0 - 15.0 g/dL   HCT 43.7 36.0 - 46.0 %   MCV 91.0 78.0 - 100.0 fL   MCH 30.2 26.0 - 34.0 pg   MCHC 33.2 30.0 - 36.0 g/dL   RDW 12.6 11.5 - 15.5 %   Platelets 197 150 - 400 K/uL   Neutrophils Relative % 42 (L) 43 - 77 %   Neutro Abs 3.3 1.7 - 7.7 K/uL   Lymphocytes Relative 42 12 - 46 %   Lymphs Abs 3.3 0.7 - 4.0 K/uL   Monocytes Relative 11 3 - 12 %   Monocytes Absolute 0.8  0.1 - 1.0 K/uL   Eosinophils Relative 4 0 - 5 %   Eosinophils Absolute 0.3 0.0 - 0.7 K/uL   Basophils Relative 1 0 - 1 %   Basophils Absolute 0.0 0.0 - 0.1 K/uL  Comprehensive metabolic panel     Status: Abnormal   Collection Time: 08/03/14  4:39 AM  Result Value Ref Range   Sodium 143 137 - 147 mEq/L   Potassium 3.8 3.7 - 5.3 mEq/L   Chloride 101 96 - 112 mEq/L   CO2 32 19 - 32 mEq/L   Glucose, Bld 89 70 - 99 mg/dL   BUN 21 6 - 23 mg/dL   Creatinine, Ser 0.85 0.50 - 1.10 mg/dL   Calcium 9.0 8.4 - 10.5 mg/dL   Total Protein 6.4 6.0 - 8.3 g/dL   Albumin 3.2 (L) 3.5 - 5.2 g/dL   AST 15 0 - 37 U/L   ALT 13 0 - 35 U/L   Alkaline Phosphatase 59 39 - 117 U/L   Total Bilirubin 0.3 0.3 - 1.2 mg/dL   GFR calc non Af Amer 69 (L) >90 mL/min   GFR calc Af Amer 80 (L) >90 mL/min    Comment: (NOTE) The eGFR has been calculated using the CKD EPI equation. This calculation has not been validated in all clinical situations. eGFR's persistently <90 mL/min signify possible Chronic  Kidney Disease.    Anion gap 10 5 - 15  Protime-INR     Status: Abnormal   Collection Time: 08/03/14  4:39 AM  Result Value Ref Range   Prothrombin Time 19.1 (H) 11.6 - 15.2 seconds   INR 1.59 (H) 0.00 - 1.49      Constitutional: She is oriented to person, place, and time.  HENT:  Head: Normocephalic.  Eyes: EOM are normal.  Neck: Normal range of motion. Neck supple. No thyromegaly present.  Cardiovascular: Normal rate. An irregularly irregular rhythm present. HR in 70's Respiratory: Effort normal and breath sounds normal. No respiratory distress.  GI: Soft. Bowel sounds are normal. She exhibits no distension. There is no tenderness.  Neurological: She is alert and oriented to person, place, and time.  Reflex Scores:  Patellar reflexes are 0 on the right side and 0 on the left side.  Achilles reflexes are 0 on the right side and 0 on the left side. Follows all commands  Reduced tone persistent  in bilateral lower limbs  Skin: Skin is warm and dry. Pump/surgical site intact Motor strength is 5/5 bilateral deltoid, biceps, triceps, grip, 3 minus right hip flexor and knee extensor ankle dorsiflexor and EHL, 4/5 and left hip flexor and knee extensor and ankle dorsiflexor and EHL Sensory intact to light touch in both upper and lower limbs with paresthesias noted in the upper extremities. Cranial nerves II through XII intact Cerebellar testing shows no evidence of dysmetria on finger-nose-finger testing in the upper extremities.   Assessment/Plan: 1. Functional deficits secondary to multiple sclerosis with pseudo-exacerbation following baclofen pump replacement which require 3+ hours per day of interdisciplinary therapy in a comprehensive inpatient rehab setting. Physiatrist is providing close team supervision and 24 hour management of active medical problems listed below. Physiatrist and rehab team continue to assess barriers to discharge/monitor patient progress toward functional and medical goals. FIM: FIM - Bathing Bathing Steps Patient Completed: Chest, Right Arm, Left Arm, Abdomen, Front perineal area, Buttocks, Right upper leg, Left upper leg, Right lower leg (including foot), Left lower leg (including foot) Bathing: 0: Activity did not occur  FIM - Upper Body Dressing/Undressing Upper body dressing/undressing steps patient completed: Thread/unthread right sleeve of pullover shirt/dresss, Thread/unthread left sleeve of pullover shirt/dress, Put head through opening of pull over shirt/dress, Pull shirt over trunk Upper body dressing/undressing: 5: Set-up assist to: Obtain clothing/put away FIM - Lower Body Dressing/Undressing Lower body dressing/undressing steps patient completed: Thread/unthread right underwear leg, Thread/unthread left underwear leg, Thread/unthread right pants leg, Thread/unthread left pants leg Lower body dressing/undressing: 3: Mod-Patient completed 50-74% of  tasks  FIM - Toileting Toileting steps completed by patient: Adjust clothing prior to toileting, Performs perineal hygiene Toileting: 3: Mod-Patient completed 2 of 3 steps  FIM - Radio producer Devices: Elevated toilet seat, Insurance account manager Transfers: 4-To toilet/BSC: Min A (steadying Pt. > 75%), 4-From toilet/BSC: Min A (steadying Pt. > 75%)  FIM - Bed/Chair Transfer Bed/Chair Transfer Assistive Devices: Walker, Bed rails, HOB elevated Bed/Chair Transfer: 5: Supine > Sit: Supervision (verbal cues/safety issues), 4: Bed > Chair or W/C: Min A (steadying Pt. > 75%)  FIM - Locomotion: Wheelchair Distance: 75'  Locomotion: Wheelchair: 2: Travels 33 - 149 ft with supervision, cueing or coaxing FIM - Locomotion: Ambulation Locomotion: Ambulation Assistive Devices: Administrator Ambulation/Gait Assistance: 4: Min assist Locomotion: Ambulation: 1: Travels less than 50 ft with minimal assistance (Pt.>75%)  Comprehension Comprehension Mode: Auditory Comprehension: 7-Follows complex conversation/direction: With no  assist  Expression Expression Mode: Verbal Expression: 6-Expresses complex ideas: With extra time/assistive device  Social Interaction Social Interaction: 6-Interacts appropriately with others with medication or extra time (anti-anxiety, antidepressant).  Problem Solving Problem Solving: 6-Solves complex problems: With extra time  Memory Memory: 7-Complete Independence: No helper  Medical Problem List and Plan: 1. Functional deficits secondary to multiple sclerosis with pseudo-exacerbation following baclofen pump replacement  -interrogated pump today--reduced basal rate to 46mg/hr  -pump alarm date is now 11/14/14 2. DVT Prophylaxis/Anticoagulation: Chronic Coumadin for atrial fibrillation. Monitor for any bleeding episodes 3. Pain Management: Neurontin 300 mg 3 times a day, hydrocodone as needed. Monitor with increased mobility 4.  Mood/depression/anxiety: Wellbutrin 300 mg daily, Xanax 0.5 mg twice a day as needed. Provide emotional support  -  neuropsych to see for anxiety and cognitive assessment 5. Neuropsych: This patient is capable of making decisions on her own behalf. 6. Skin/Wound Care: Routine skin checks 7. Fluids/Electrolytes/Nutrition: Strict I and O's. Follow-up chemistries ok today  -encourage adequate fluid intake  8. Hypertension. Lasix 40 mg twice a day---decreased to 255mbid given lower bp's  - Bystolic 20 mg daily.     9. Hypothyroidism.. Marland Kitchenynthroid. 10. Hyperlipidemia. 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 Adjusedt bowel program.allow lactulose bid,   -resumed Keflex QHS   -Continue Flomax 0.4 mg daily. Check PVRs 3  LOS (Days) 4    A FACE TO FACE EVALUATION WAS PERFORMED  Sami Froh T 08/04/2014, 8:32 AM

## 2014-08-04 NOTE — Telephone Encounter (Signed)
Patient is calling because she needs to get pre authorization from [employer] for this month for [employer]. If you need to contact patient it is ok to leave a message.

## 2014-08-04 NOTE — Progress Notes (Signed)
Physical Therapy Session Note  Patient Details  Name: Toni [name] MRN: [id number] Date of Birth: 1945/08/31  Today's Date: 08/04/2014 PT Individual Time: 0900-1000 PT Individual Time Calculation (min): 60 min   Short Term Goals: Week 1:  PT Short Term Goal 1 (Week 1): Pt will be supervision for bed mobility PT Short Term Goal 2 (Week 1): Pt will be min guard for transfers PT Short Term Goal 3 (Week 1): Pt will ambulate 62' with Min A PT Short Term Goal 4 (Week 1): Pt will propel w/c 100' with supervision  Skilled Therapeutic Interventions/Progress Updates:   Pt expressed concern in regard to schedule and her need for longer rest periods between sessions due to her activity tolerance limitations due to MS. Plan to discuss this with schedulers as well as limiting late afternoon therapy sessions for same reason. If not adequate, pt may require reduced therapy schedule to meet requirements of program. Will continue to address with primary treatment team.  Assisted pt with toileting at beginning and end of session with pt performing transfers to/from toilet with S using grab bars and pulling up w/c to toilet (deferred gait due to pt expressed fatigue from shower this AM and still with low BP issues). Pt able to manage clothing and perform hygiene at S level. Transported down to therapy gym with focus to begin LE HEP for strengthening and stretching program. Pt with tight hamstrings and heel cords and limiting pt's ability to perform knee extension during gait and standing (walks with flexed knees (R > L) for several months per pt report). Issued multiple handouts for HEP to pt and will review other exercises in later PT sessions today. Addressed self-stretching program including knee to chest stretch, trunk rotation, heel cord and hamstring stretch , and piriformis/IT band stretch. Recommended to work up to 3 times each BLE with 30 second hold. Pt limited more on R than L with tolerance and ROM. Pt  expressed that she knows she "gave up" on her HEP which she reports she used to do fairly consistently but due to weakness in the last few months has not been able. Education on energy conservation in regards to HEP as well due to MS diagnosis and pt will benefit from continued education with this. She seems to have a pretty good handle on managing her day with awareness to her limitations. Also recommend w/c for home use to conserve energy at times (later in the day) or longer distances in the community.   Therapy Documentation Precautions:  Precautions Precautions: Fall Precaution Comments: watch gait belt placement due to incision (axillae preferable) Restrictions Weight Bearing Restrictions: No  Pain:  c/o rash on mid section of body - pt reports RN and PA already made aware.   See FIM for current functional status  Therapy/Group: Individual Therapy  [name] [facility] 08/04/2014, 10:13 AM

## 2014-08-04 NOTE — Progress Notes (Signed)
Occupational Therapy Session Note  Patient Details  Name: Toni [name] MRN: [id number] Date of Birth: 05-27-1946  Today's Date: 08/04/2014 OT Individual Time: 8416-6063 OT Individual Time Calculation (min): 60 min   Short Term Goals: Week 1:  OT Short Term Goal 1 (Week 1): Pt will perform bathing with Min A in order to increase I in self care.  OT Short Term Goal 2 (Week 1): Pt will perform 2/3 tasks of toileting in order to increase I in self care.  OT Short Term Goal 3 (Week 1): Pt will perform shower transfer with Min A in order to increase I in self care. OT Short Term Goal 4 (Week 1): Pt will engage in 20 minutes of functional task with no more than 1 rest break secondary to fatigue.  Skilled Therapeutic Interventions/Progress Updates:  Patient received supine in bed ready and eager for therapy "I'm ready to take a shower!". Patient engaged in bed mobility and sat EOB. Patient with no complaints of pain, but complaints of tightness around trunk. Patient stood with RW and ambulated into BR for shower stall transfer. Patient completed UB/LB bathing in sit<>stand position from bench. During bathing, therapist noticed red bumps on patient's skin, notified RN and RN present for skin check.  Patient dried and transferred bench > w/c secondary to general fatigue/decreased endurance after shower. Patient then sat at sink for grooming tasks and completed UB/LB dressing in sit<>stand position from w/c. Therapist assisted patient with donning of TEDs. At end of session, left patient seated in w/c with all needs within reach and RN present for medication management.   Precautions:  Precautions Precautions: Fall Precaution Comments: watch gait belt placement due to incision (axillae preferable) Restrictions Weight Bearing Restrictions: No  See FIM for current functional status  Therapy/Group: Individual Therapy  [name] 08/04/2014, 9:49 AM

## 2014-08-04 NOTE — Progress Notes (Signed)
Social Work  Social Work Assessment and Plan  Patient Details  Name: Toni [name] MRN: [id number] Date of Birth: 09-21-1945  Today's Date: 08/03/2014  Problem List:  Patient Active Problem List   Diagnosis Date Noted  . MS (multiple sclerosis) 07/29/2014  . Closed right ankle fracture 10/30/2013  . Leucocytosis 10/30/2013  . Ankle fracture 10/30/2013  . Varicose veins of lower extremities with other complications 75/05/2584  . Paroxysmal a-fib 06/04/2013  . Morbid obesity 06/04/2013  . OSA on CPAP 06/04/2013  . Muscle spasms of lower extremity 02/24/2013  . Long term (current) use of anticoagulants 11/05/2012  . 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 Aspiration pneumonia vs Failed outpatient Rx of CAP 11/26/2011  . Hypertension   . Campath-induced atrial fibrillation   . Thyroid disease   . HLD (hyperlipidemia)    Past Medical History:  Past Medical History  Diagnosis Date  . Multiple sclerosis     has had this may 1989-[place] reg doc  . Hypertension   . PAF (paroxysmal atrial fibrillation)     on coumadin; documented on monitor 06/2010  . Thyroid disease   . HLD (hyperlipidemia)   . Shortness of breath   . Pneumonia   . Hyperthyroidism   . Edema     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' ablation of R GSV 2012  . Depression   . Anxiety   . Headache(784.0)     MIGRAINE   . Chronic pain   . Hx of echocardiogram 04/22/10    EF 55-60%, no valve issues  . History of stress test 06/21/10    limited exam with some degree of breast attenuatin of the apex however a component of apical ischemia is not excluded, EF 62%; cardiac cath   . Sleep apnea     cpap   Past Surgical History:  Past Surgical History  Procedure Laterality Date  . Cervical fusion      with correction-june 2005  . Infusion pump implantation      baclofen infusion in lower abd  . Venous ablation  12/16/10    radiofreq ablation -Dr  Elisabeth Cara and [facility]  . Child births      X37  . Cardiac catheterization  06/22/10    normal coronary arteries, PAF  . Tonsillectomy    . Pain pump revision N/A 07/29/2014    Procedure: Baclofen pump replacement;  Surgeon: Erline Levine, MD;  Location: [facility];  Service: Neurosurgery;  Laterality: N/A;  Baclofen pump replacement   Social History:  reports that she quit smoking about 37 years ago. She has never used smokeless tobacco. She reports that she drinks alcohol. She reports that she does not use illicit drugs.  Family / Support Systems Marital Status: Married How Long?: 47 yrs Patient Roles: Spouse, Parent Spouse/Significant Other: husband, Tyrika Newman @ (H) [phone] or [phone] Children: son, [name], currently living at home with pt, however, this is temporary;  daughter in [place] Other Supports: many close friends and church support Anticipated Caregiver: spouse Ability/Limitations of Caregiver: spouse can provide min assist.  Caregiver Availability: 24/7 Family Dynamics: pt describes very supportive husband and family. Denies any significant stressors/ issues.  Social History Preferred language: English Religion: [employer] Cultural Background: NA Education: college Read: Yes Write: Yes Employment Status: Disabled Date Retired/Disabled/Unemployed: Paediatric nurse Issues: None Guardian/Conservator: NOne - per MD, pt capable of making decisions on her own behalf   Abuse/Neglect  Physical Abuse: Denies Verbal Abuse: Denies Sexual Abuse: Denies Exploitation of patient/patient's resources: Denies Self-Neglect: Denies  Emotional Status Pt's affect, behavior adn adjustment status: Pt very pleasant and talkative.  Does not report or appear to be in any significant emotional distress.  Does admit much frustration with her BP running so low which she feels is significantly interfereing with her therapy progress.  She expresses concern that if she is not  able to show progress, then insurance will not continue to cover.  Will monitor emotional adjustment and refer to neuropsychology if needed. Recent Psychosocial Issues: None Pyschiatric History: Pt reports that she has taken Wellbutrin for some time, however, was prescribed by primary MD and does not have any formal psychological tx or support in past. Substance Abuse History: None  Patient / Family Perceptions, Expectations & Goals Pt/Family understanding of illness & functional limitations: Pt and family with very good understanding of the medical issues, procedures that have taken place.  Good awareness of her current functional limitations/ need for CIR. Premorbid pt/family roles/activities: Pt reports that she was primarily using her walker in the home but would occasionally need to use w/c later in the evening.  W/c with any activity outside of the home. Able to be alone at home for 3-4 hours at a time.  Maintained her bridge group and other friend activities usually with them coming to her home for ease of the situation.   Anticipated changes in roles/activities/participation: Little change anticipated if able to reach supervision/ mod i goals.  Husband has been providing primary caregiver assist and can continue. Pt/family expectations/goals: "I just want to see my blood pressure get regulated so I can make some progress here."  [employer]: None Premorbid Home Care/DME Agencies: None Transportation available at discharge: husband or son  Discharge Planning Living Arrangements: Spouse/significant other, Children Support Systems: Spouse/significant other, Children, Other relatives, Water engineer, Social worker community Type of Residence: Private residence Insurance Resources: [employer], Multimedia programmer (specify) ([employer] of [place]) Financial Resources: Radio broadcast assistant Screen Referred: No Living Expenses: Own Money Management: Spouse Does the patient  have any problems obtaining your medications?: No Home Management: mostly husband and son Patient/Family Preliminary Plans: Pt to return home with her spouse and son providing any needed support Social Work Anticipated Follow Up Needs: HH/OP Expected length of stay: 10-14 days  Clinical Impression Very pleasant woman with long h/o MS and here following surgical replacement of baclofen pump.  Currently having issues with low BP which is frustrating and concerning to her because she feels it is limiting her rehab progress.  Denies any significant emotional distress and reports excellent family support.  Focused on making gains to hopefully reach her premorbid level of functioning.  (walker level mobility in the home.).  Will follow for support and d/c planning.  Yareni Creps 08/03/2014, 5:16 PM

## 2014-08-04 NOTE — Progress Notes (Signed)
[place] for coumadin Indication: atrial fibrillation  Allergies  Allergen Reactions  . Oxycodone     Oral oxycodone causes vomiting  . Chlorhexidine Gluconate     Sores at site  . Zoloft [Sertraline] Hives, Swelling and Rash    Patient Measurements: Height: 5\' 3"  (160 cm) Weight: 263 lb 10.7 oz (119.6 kg) IBW/kg (Calculated) : 52.4  Vital Signs: Temp: 98.4 F (36.9 C) (12/15 0441) Temp Source: Oral (12/15 0441) BP: 115/55 mmHg (12/15 0831) Pulse Rate: 82 (12/15 0441)  Labs:  Recent Labs  08/02/14 0438 08/03/14 0439 08/04/14 0836  HGB  --  14.5  --   HCT  --  43.7  --   PLT  --  197  --   LABPROT 16.2* 19.1* 20.0*  INR 1.29 1.59* 1.68*  CREATININE  --  0.85  --     Estimated Creatinine Clearance: 80.4 mL/min (by C-G formula based on Cr of 0.85).  Assessment: 68 yo female on chronic coumadin for hx of afib s/p baclofen pump replacement. INR increased to 1.68 today. Hgb and plts WNL, no bleeding noted.  Patient was on Coumadin 5 mg po daily prior to admission.    Goal of Therapy:  INR 2-3 Monitor platelets by anticoagulation protocol: Yes   Plan:  - coumadin 7.5mg  po x1 again tonight  - INR daily - follow for s/s bleeding  Maryanna Shape, PharmD, BCPS  Clinical Pharmacist  Pager: [phone]  08/04/2014 1:26 PM

## 2014-08-05 ENCOUNTER — Inpatient Hospital Stay (HOSPITAL_COMMUNITY): Payer: Medicare Other

## 2014-08-05 ENCOUNTER — Inpatient Hospital Stay (HOSPITAL_COMMUNITY): Payer: Medicare Other | Admitting: Physical Therapy

## 2014-08-05 ENCOUNTER — Encounter (HOSPITAL_COMMUNITY): Payer: Medicare Other | Admitting: Occupational Therapy

## 2014-08-05 DIAGNOSIS — N390 Urinary tract infection, site not specified: Secondary | ICD-10-CM

## 2014-08-05 DIAGNOSIS — A499 Bacterial infection, unspecified: Secondary | ICD-10-CM

## 2014-08-05 LAB — PROTIME-INR
INR: 1.89 — ABNORMAL HIGH (ref 0.00–1.49)
Prothrombin Time: 21.8 seconds — ABNORMAL HIGH (ref 11.6–15.2)

## 2014-08-05 LAB — BASIC METABOLIC PANEL
Anion gap: 11 (ref 5–15)
BUN: 19 mg/dL (ref 6–23)
CO2: 29 mEq/L (ref 19–32)
Calcium: 9 mg/dL (ref 8.4–10.5)
Chloride: 102 mEq/L (ref 96–112)
Creatinine, Ser: 0.79 mg/dL (ref 0.50–1.10)
GFR, EST NON AFRICAN AMERICAN: 84 mL/min — AB (ref >90)
GLUCOSE: 84 mg/dL (ref 70–99)
POTASSIUM: 4.2 meq/L (ref 3.7–5.3)
SODIUM: 142 meq/L (ref 137–147)

## 2014-08-05 LAB — URINALYSIS, ROUTINE W REFLEX MICROSCOPIC
Bilirubin Urine: NEGATIVE
GLUCOSE, UA: NEGATIVE mg/dL
Hgb urine dipstick: NEGATIVE
Ketones, ur: NEGATIVE mg/dL
Nitrite: NEGATIVE
Protein, ur: NEGATIVE mg/dL
Specific Gravity, Urine: 1.02 (ref 1.005–1.030)
UROBILINOGEN UA: 0.2 mg/dL (ref 0.0–1.0)
pH: 5.5 (ref 5.0–8.0)

## 2014-08-05 LAB — URINE MICROSCOPIC-ADD ON

## 2014-08-05 MED ORDER — PREDNISONE 20 MG PO TABS
20.0000 mg | ORAL_TABLET | Freq: Once | ORAL | Status: AC
  Administered 2014-08-05: 20 mg via ORAL
  Filled 2014-08-05 (×2): qty 1

## 2014-08-05 MED ORDER — HYDROCORTISONE 1 % EX OINT
TOPICAL_OINTMENT | CUTANEOUS | Status: DC | PRN
  Administered 2014-08-05: 11:00:00 via TOPICAL
  Filled 2014-08-05: qty 28.35

## 2014-08-05 MED ORDER — WARFARIN SODIUM 5 MG PO TABS
5.0000 mg | ORAL_TABLET | Freq: Once | ORAL | Status: AC
  Administered 2014-08-05: 5 mg via ORAL
  Filled 2014-08-05: qty 1

## 2014-08-05 NOTE — Progress Notes (Signed)
[place] for coumadin Indication: atrial fibrillation  Allergies  Allergen Reactions  . Oxycodone     Oral oxycodone causes vomiting  . Chlorhexidine Gluconate     Sores at site  . Zoloft [Sertraline] Hives, Swelling and Rash    Patient Measurements: Height: 5\' 3"  (160 cm) Weight: 263 lb 10.7 oz (119.6 kg) IBW/kg (Calculated) : 52.4  Vital Signs: Temp: 98.1 F (36.7 C) (12/16 1008) Temp Source: Oral (12/16 1008) BP: 105/45 mmHg (12/16 1008) Pulse Rate: 94 (12/16 1008)  Labs:  Recent Labs  08/03/14 0439 08/04/14 0836 08/05/14 0411  HGB 14.5  --   --   HCT 43.7  --   --   PLT 197  --   --   LABPROT 19.1* 20.0* 21.8*  INR 1.59* 1.68* 1.89*  CREATININE 0.85  --  0.79    Estimated Creatinine Clearance: 85.4 mL/min (by C-G formula based on Cr of 0.79).  Assessment: 68 yo female on chronic coumadin for hx of afib s/p baclofen pump replacement. INR increased to 1.89 today. Hgb and plts WNL, no bleeding noted.  Patient was on Coumadin 5 mg po daily prior to admission.    Goal of Therapy:  INR 2-3 Monitor platelets by anticoagulation protocol: Yes   Plan:  - coumadin 5mg  po x1 again tonight  - INR daily - follow for s/s bleeding  Maryanna Shape, PharmD, BCPS  Clinical Pharmacist  Pager: [phone]   08/05/2014 1:51 PM

## 2014-08-05 NOTE — Progress Notes (Signed)
Subjective/Complaints: 68 y.o. right handed female with history of atrial fibrillation on chronic Coumadin, multiple sclerosis diagnosed in 1989 followed by neurology services Dr. Brett Fairy. Patient lives with her husband used a walker prior to admission and was independent with assistive device. Presented 07/29/2014 with increased spasticity secondary to multiple sclerosis. Patient has a baclofen pump in place. Underwent baclofen pump replacement 07/29/2014 per Dr. Vertell Limber. Hospital course pain management as well as titration and baclofen to aid in her spasticity. Physical therapy evaluation completed 07/30/2014 with recommendations of physical medicine rehabilitation consult.PT notes plus to assist today compared to supervision with a walker yesterday  Objective:  seemed to have a better day yesterday. Still fatigues in the afternoon. Developed rash on belly yesterday.   Vital Signs: Blood pressure 104/38, pulse 81, temperature 98.3 F (36.8 C), temperature source Oral, resp. rate 18, height '5\' 3"'  (1.6 m), weight 119.6 kg (263 lb 10.7 oz), SpO2 97 %. No results found. Results for orders placed or performed during the hospital encounter of 07/31/14 (from the past 72 hour(s))  CBC WITH DIFFERENTIAL     Status: Abnormal   Collection Time: 08/03/14  4:39 AM  Result Value Ref Range   WBC 7.7 4.0 - 10.5 K/uL   RBC 4.80 3.87 - 5.11 MIL/uL   Hemoglobin 14.5 12.0 - 15.0 g/dL   HCT 43.7 36.0 - 46.0 %   MCV 91.0 78.0 - 100.0 fL   MCH 30.2 26.0 - 34.0 pg   MCHC 33.2 30.0 - 36.0 g/dL   RDW 12.6 11.5 - 15.5 %   Platelets 197 150 - 400 K/uL   Neutrophils Relative % 42 (L) 43 - 77 %   Neutro Abs 3.3 1.7 - 7.7 K/uL   Lymphocytes Relative 42 12 - 46 %   Lymphs Abs 3.3 0.7 - 4.0 K/uL   Monocytes Relative 11 3 - 12 %   Monocytes Absolute 0.8 0.1 - 1.0 K/uL   Eosinophils Relative 4 0 - 5 %   Eosinophils Absolute 0.3 0.0 - 0.7 K/uL   Basophils Relative 1 0 - 1 %   Basophils Absolute 0.0 0.0 - 0.1 K/uL   Comprehensive metabolic panel     Status: Abnormal   Collection Time: 08/03/14  4:39 AM  Result Value Ref Range   Sodium 143 137 - 147 mEq/L   Potassium 3.8 3.7 - 5.3 mEq/L   Chloride 101 96 - 112 mEq/L   CO2 32 19 - 32 mEq/L   Glucose, Bld 89 70 - 99 mg/dL   BUN 21 6 - 23 mg/dL   Creatinine, Ser 0.85 0.50 - 1.10 mg/dL   Calcium 9.0 8.4 - 10.5 mg/dL   Total Protein 6.4 6.0 - 8.3 g/dL   Albumin 3.2 (L) 3.5 - 5.2 g/dL   AST 15 0 - 37 U/L   ALT 13 0 - 35 U/L   Alkaline Phosphatase 59 39 - 117 U/L   Total Bilirubin 0.3 0.3 - 1.2 mg/dL   GFR calc non Af Amer 69 (L) >90 mL/min   GFR calc Af Amer 80 (L) >90 mL/min    Comment: (NOTE) The eGFR has been calculated using the CKD EPI equation. This calculation has not been validated in all clinical situations. eGFR's persistently <90 mL/min signify possible Chronic Kidney Disease.    Anion gap 10 5 - 15  Protime-INR     Status: Abnormal   Collection Time: 08/03/14  4:39 AM  Result Value Ref Range   Prothrombin Time 19.1 (  H) 11.6 - 15.2 seconds   INR 1.59 (H) 0.00 - 1.49  Protime-INR     Status: Abnormal   Collection Time: 08/04/14  8:36 AM  Result Value Ref Range   Prothrombin Time 20.0 (H) 11.6 - 15.2 seconds   INR 1.68 (H) 0.00 - 1.49  Protime-INR     Status: Abnormal   Collection Time: 08/05/14  4:11 AM  Result Value Ref Range   Prothrombin Time 21.8 (H) 11.6 - 15.2 seconds   INR 1.89 (H) 0.00 - 9.32  Basic metabolic panel     Status: Abnormal   Collection Time: 08/05/14  4:11 AM  Result Value Ref Range   Sodium 142 137 - 147 mEq/L   Potassium 4.2 3.7 - 5.3 mEq/L   Chloride 102 96 - 112 mEq/L   CO2 29 19 - 32 mEq/L   Glucose, Bld 84 70 - 99 mg/dL   BUN 19 6 - 23 mg/dL   Creatinine, Ser 0.79 0.50 - 1.10 mg/dL   Calcium 9.0 8.4 - 10.5 mg/dL   GFR calc non Af Amer 84 (L) >90 mL/min   GFR calc Af Amer >90 >90 mL/min    Comment: (NOTE) The eGFR has been calculated using the CKD EPI equation. This calculation has not been  validated in all clinical situations. eGFR's persistently <90 mL/min signify possible Chronic Kidney Disease.    Anion gap 11 5 - 15      Constitutional: She is oriented to person, place, and time.  HENT:  Head: Normocephalic.  Eyes: EOM are normal.  Neck: Normal range of motion. Neck supple. No thyromegaly present.  Cardiovascular: Normal rate. An irregularly irregular rhythm present. HR in 70's Respiratory: Effort normal and breath sounds normal. No respiratory distress.  GI: Soft. Bowel sounds are normal. She exhibits no distension. There is no tenderness.  Neurological: She is alert and oriented to person, place, and time.  Reflex Scores:  Patellar reflexes are 0 on the right side and 0 on the left side.  Achilles reflexes are 0 on the right side and 0 on the left side. Follows all commands  Reduced tone persistent in bilateral lower limbs  Skin: Skin is warm and dry. Pump/surgical site intact Motor strength is 5/5 bilateral deltoid, biceps, triceps, grip, 3 minus right hip flexor and knee extensor ankle dorsiflexor and EHL, 4/5 and left hip flexor and knee extensor and ankle dorsiflexor and EHL Sensory intact to light touch in both upper and lower limbs with paresthesias noted in the upper extremities. Cranial nerves II through XII intact Cerebellar testing shows no evidence of dysmetria on finger-nose-finger testing in the upper extremities.   Assessment/Plan: 1. Functional deficits secondary to multiple sclerosis with pseudo-exacerbation following baclofen pump replacement which require 3+ hours per day of interdisciplinary therapy in a comprehensive inpatient rehab setting. Physiatrist is providing close team supervision and 24 hour management of active medical problems listed below. Physiatrist and rehab team continue to assess barriers to discharge/monitor patient progress toward functional and medical goals. FIM: FIM - Bathing Bathing Steps Patient Completed:  Chest, Right Arm, Left Arm, Abdomen, Front perineal area, Buttocks, Right upper leg, Left upper leg, Right lower leg (including foot), Left lower leg (including foot) Bathing: 0: Activity did not occur  FIM - Upper Body Dressing/Undressing Upper body dressing/undressing steps patient completed: Thread/unthread right sleeve of pullover shirt/dresss, Thread/unthread left sleeve of pullover shirt/dress, Put head through opening of pull over shirt/dress, Pull shirt over trunk Upper body dressing/undressing: 5: Set-up  assist to: Obtain clothing/put away FIM - Lower Body Dressing/Undressing Lower body dressing/undressing steps patient completed: Thread/unthread right underwear leg, Thread/unthread left underwear leg, Thread/unthread right pants leg, Thread/unthread left pants leg Lower body dressing/undressing: 3: Mod-Patient completed 50-74% of tasks  FIM - Toileting Toileting steps completed by patient: Adjust clothing prior to toileting, Performs perineal hygiene, Adjust clothing after toileting Toileting Assistive Devices: Grab bar or rail for support Toileting: 5: Supervision: Safety issues/verbal cues  FIM - Radio producer Devices: Product manager Transfers: 5-To toilet/BSC: Supervision (verbal cues/safety issues), 5-From toilet/BSC: Supervision (verbal cues/safety issues)  FIM - Control and instrumentation engineer Devices: Walker, Arm rests, HOB elevated Bed/Chair Transfer: 5: Chair or W/C > Bed: Supervision (verbal cues/safety issues), 5: Sit > Supine: Supervision (verbal cues/safety issues)  FIM - Locomotion: Wheelchair Distance: 75'  Locomotion: Wheelchair: 0: Activity did not occur FIM - Locomotion: Ambulation Locomotion: Ambulation Assistive Devices: Administrator Ambulation/Gait Assistance: 4: Min guard, 5: Supervision Locomotion: Ambulation: 1: Travels less than 50 ft with minimal assistance (Pt.>75%)  Comprehension Comprehension  Mode: Auditory Comprehension: 7-Follows complex conversation/direction: With no assist  Expression Expression Mode: Verbal Expression: 6-Expresses complex ideas: With extra time/assistive device  Social Interaction Social Interaction: 6-Interacts appropriately with others with medication or extra time (anti-anxiety, antidepressant).  Problem Solving Problem Solving: 6-Solves complex problems: With extra time  Memory Memory: 7-Complete Independence: No helper  Medical Problem List and Plan: 1. Functional deficits secondary to multiple sclerosis with pseudo-exacerbation following baclofen pump replacement  -interrogated pump today--reduced basal rate to 45mg/hr  -pump alarm date is now 11/14/14 2. DVT Prophylaxis/Anticoagulation: Chronic Coumadin for atrial fibrillation. Monitor for any bleeding episodes 3. Pain Management: Neurontin 300 mg 3 times a day, hydrocodone as needed. Monitor with increased mobility 4. Mood/depression/anxiety: Wellbutrin 300 mg daily, Xanax 0.5 mg twice a day as needed. Provide emotional support  -  neuropsych to see for anxiety and cognitive assessment 5. Neuropsych: This patient is capable of making decisions on her own behalf. 6. Skin/Wound Care: Routine skin checks 7. Fluids/Electrolytes/Nutrition: Strict I and O's. Follow-up chemistries ok today  -encourage adequate fluid intake  8. Hypertension. Lasix 40 mg twice a day---decreased to 24mbid given lower bp's  - Bystolic 20 mg daily.    -bp's look better with adjustment that have been made 9. Hypothyroidism.. Marland Kitchenynthroid. 10. Hyperlipidemia. 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 Adjusedt bowel program.allow lactulose bid,   -resumed Keflex QHS   -Continue Flomax 0.4 mg daily. Check PVRs 3   -check ucx 12. Rash---likely contact or heat--only on belly/inguinal area  -one dose prednisone  -steroid cream prn  -benadryl prn  -hypoallergenic sheets LOS (Days) 5    A FACE TO FACE EVALUATION  WAS PERFORMED  SWARTZ,ZACHARY T 08/05/2014, 8:30 AM

## 2014-08-05 NOTE — Progress Notes (Signed)
Patient states she will put herself on CPAP before bed.  CPAP set on auto mode [id number].

## 2014-08-05 NOTE — Progress Notes (Signed)
Occupational Therapy Session Note  Patient Details  Name: Toni [name] MRN: [id number] Date of Birth: 09-10-1945  Today's Date: 08/05/2014 OT Individual Time: 9323-5573 OT Individual Time Calculation (min): 60 min   Short Term Goals: Week 1:  OT Short Term Goal 1 (Week 1): Pt will perform bathing with Min A in order to increase I in self care.  OT Short Term Goal 2 (Week 1): Pt will perform 2/3 tasks of toileting in order to increase I in self care.  OT Short Term Goal 3 (Week 1): Pt will perform shower transfer with Min A in order to increase I in self care. OT Short Term Goal 4 (Week 1): Pt will engage in 20 minutes of functional task with no more than 1 rest break secondary to fatigue.  Skilled Therapeutic Interventions/Progress Updates:  Patient received supine in bed. Patient with no complaints of pain, but some discomfort around trunk. Patient engaged in bed mobility and ambulated from bed > BR for toilet transfer, then shower stall transfer with occasional steady assist. Therapist covered surgery site and patient completed UB/LB bathing in sit<>stand position with distant supervision. Patient continues to have rash around trunk, mainly covering right side; RN and DR aware. Patient stood with RW and ambulated > elevated toilet seat after shower, took seated rest break, then ambulated > w/c set up near sink. From here, patient engaged in grooming tasks seated in w/c at sink and UB/LB dressing in sit<>stand position. Patient used Secondary school teacher to Optician, dispensing from [employer]. After dressing, patient with bowel urgency. Therapist assisted patient into bathroom via w/c and patient transferred onto elevated toilet seat with supervision using grab bars to assist. At end of session, left patient seated in w/c with all needs within reach.    Precautions:  Precautions Precautions: Fall Precaution Comments: watch gait belt placement due to incision (axillae preferable) Restrictions Weight Bearing  Restrictions: No  See FIM for current functional status  Therapy/Group: Individual Therapy  Britton Bera 08/05/2014, 8:35 AM

## 2014-08-05 NOTE — Progress Notes (Signed)
Pt. Complained about some soreness on her arms,request to talk to Toni [name] is talking to pt. At the moment.Keep monitoring closely and assessing her needs.

## 2014-08-05 NOTE — Progress Notes (Signed)
Physical Therapy Session Note  Patient Details  Name: Toni [name] MRN: [id number] Date of Birth: [birth year]  Today's Date: 08/05/2014 PT Individual Time: 1300-1330 PT Individual Time Calculation (min): 30 min   Short Term Goals: Week 1:  PT Short Term Goal 1 (Week 1): Pt will be supervision for bed mobility PT Short Term Goal 2 (Week 1): Pt will be min guard for transfers PT Short Term Goal 3 (Week 1): Pt will ambulate 75' with Min A PT Short Term Goal 4 (Week 1): Pt will propel w/c 100' with supervision  Skilled Therapeutic Interventions/Progress Updates:   Pt received sitting in w/c, agreeable to therapy. Pt propelled w/c using BUE x 200 ft with 1 rest break and supervision. Pt performed car transfer to sedan height with short distance ambulation (5-7 ft) to/from car using RW with supervision and verbal cues for technique. Pt propelled w/c 20 ft back towards room before requesting to rest. In room, pt ambulated to/from bathroom x 10 ft using RW with close supervision and performed toilet transfer and clothing management with grab bar and supervision. Pt washed hands from w/c level and performed stand pivot transfer using RW and sit > supine with supervision. Pt left semi reclined in bed with all needs within reach.   Therapy Documentation Precautions:  Precautions Precautions: Fall Precaution Comments: watch gait belt placement due to incision (axillae preferable) Restrictions Weight Bearing Restrictions: No Pain: Pain Assessment Pain Assessment: No/denies pain Locomotion : Ambulation Ambulation/Gait Assistance: 5: Supervision   See FIM for current functional status  Therapy/Group: Individual Therapy  Laretta Alstrom 08/05/2014, 1:49 PM

## 2014-08-05 NOTE — Progress Notes (Signed)
Physical Therapy Session Note  Patient Details  Name: Toni [name] MRN: [id number] Date of Birth: [birth year]  Today's Date: 08/05/2014 PT Individual Time: 1000-1030 PT Individual Time Calculation (min): 30 min  Session 2 Time: 1400-1500 Time Calculation (min): 60  Short Term Goals: Week 1:  PT Short Term Goal 1 (Week 1): Pt will be supervision for bed mobility PT Short Term Goal 2 (Week 1): Pt will be min guard for transfers PT Short Term Goal 3 (Week 1): Pt will ambulate 64' with Min A PT Short Term Goal 4 (Week 1): Pt will propel w/c 100' with supervision  Skilled Therapeutic Interventions/Progress Updates:    Session 1: Pt received seated in w/c, agreeable to participate in therapy. Session focused on ambulation, functional endurance. Pt propelled w/c 55' then 30' to rehab gym w/ BUE, seated rest break in between to address functional endurance. Pt able to appropriately judge need for rest break. Ambulated 8' then 45' w/ RW and close S w/ close w/c follow, noted decreased knee flexion posture vs yesterday, pt continues to demonstrate heavy reliance on UE during gait. Pt transported back to room via w/c for time management. Session ended in pt's room, left handed off to NT preparing to use bathroom.   Session 2: Pt received supine in bed, agreeable to participate in therapy. Session focused on functional endurance, transfers, B quad strengthening to improve gait. Pt able to propel w/c up to 75-80' at a time due to decreased activity tolerance. Pt completed 4' then 3' on L3 w/ BUE/BLE to improve R knee ROM and general cardiovascular endurance. Pt w/ multiple SPT throughout session with RW and close S, progressing to MinGuard with increased fatigue. Standing at [facility] pt performed blocked practice x5 sit<>stands, then x5 ea LE terminal knee extension in standing along with cues for upright posture. During session pt reported need for bathroom, transferred to/from toilet w/ close S. Session  ended in pt's room, pt left supine in bed w/ all needs within reach.    Therapy Documentation Precautions:  Precautions Precautions: Fall Precaution Comments: watch gait belt placement due to incision (axillae preferable) Restrictions Weight Bearing Restrictions: No Pain:  No/denies pain  See FIM for current functional status  Therapy/Group: Individual Therapy  Rada Hay  Rada Hay, PT, DPT 08/05/2014, 12:28 PM

## 2014-08-05 NOTE — Patient Care Conference (Signed)
Inpatient RehabilitationTeam Conference and Plan of Care Update Date: 08/04/2014   Time: 14;40 PM    Patient Name: Toni [name]      Medical Record Number: [id number]  Date of Birth: 09-24-1945 Sex: Female         Room/Bed: 4W21C/4W21C-01 Payor Info: Payor: MEDICARE / Plan: MEDICARE PART A AND B / Product Type: *No Product type* /    Admitting Diagnosis: STERN MS BACLOFEN PUMP REPLACEMENT  Admit Date/Time:  07/31/2014  2:10 PM Admission Comments: No comment available   Primary Diagnosis:  MS (multiple sclerosis) Principal Problem: MS (multiple sclerosis)  Patient Active Problem List   Diagnosis Date Noted  . MS (multiple sclerosis) 07/29/2014  . Closed right ankle fracture 10/30/2013  . Leucocytosis 10/30/2013  . Ankle fracture 10/30/2013  . Varicose veins of lower extremities with other complications 44/10/4740  . Paroxysmal a-fib 06/04/2013  . Morbid obesity 06/04/2013  . OSA on CPAP 06/04/2013  . Muscle spasms of lower extremity 02/24/2013  . Long term (current) use of anticoagulants 11/05/2012  . 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 Aspiration pneumonia vs Failed outpatient Rx of CAP 11/26/2011  . Hypertension   . Campath-induced atrial fibrillation   . Thyroid disease   . HLD (hyperlipidemia)     Expected Discharge Date: Expected Discharge Date: 08/11/14  Team Members Present: Physician leading conference: Dr. Alger Simons Social Worker Present: Ovidio Kin, LCSW Nurse Present: Elliot Cousin, RN PT Present: [name], Lorriane Shire, PT OT Present: Salome Spotted, OT;Patricia Lissa Hoard, OT SLP Present: Weston Anna, SLP PPS Coordinator present : Daiva Nakayama, RN, CRRN     Current Status/Progress Goal Weekly Team Focus  Medical   ms with pseudoexacerbation. baclofen pump replaced. a littlle dry, pump titrated down  improve stamina, balance  bp and tone control, pump management.    Bowel/Bladder   Continent to  bowel and bladder.  To continue continent to bowel and bladder.  To monitor bladder and bowel function Q shift.   Swallow/Nutrition/ Hydration             ADL's   Supervision for bathing, UB dressing, grooming tasks - min assist for functional transfers using RW -  mod assist for LB dressing - total assist for toileting  overall mod I, except supervision for shower transfer  functional transfers, functional mobility, ADL retraining, overall functional activkity tolerance/endurance, energy conservation, education regarding all   Mobility   min A overall with RW short distances; orthostasis and endurance limiting  mod I overall with RW household distances; mod I w/c propulsion for strength and endurance  endurance, standing balance, gait training, functional strengthening, transfers   Communication     na        Safety/Cognition/ Behavioral Observations    no unsafe behaviors        Pain   No complain of pain.  Keep pain level less than 3,on scale 1 to 19  Assess for pain levels Q 2-3 hrs.   Skin   Rash on flank area,reedness around groin area.Cortisone cream PRN.  To prevent any skin break down.  To monitore skin Q shift.    Rehab Goals Patient on target to meet rehab goals: Yes *See Care Plan and progress notes for long and short-term goals.  Barriers to Discharge: poor stamina, weakness    Possible Resolutions to Barriers:  continued tone mgt, improve strength, spacing out therapies.    Discharge Planning/Teaching Needs:  home with husband who can  provide 24/7 assistacne      Team Discussion:  Adjusted baclofen pump-better in am tires in the pm. Space out therapies-goals mod/i level.   Revisions to Treatment Plan:  None   Continued Need for Acute Rehabilitation Level of Care: The patient requires daily medical management by a physician with specialized training in physical medicine and rehabilitation for the following conditions: Daily direction of a multidisciplinary  physical rehabilitation program to ensure safe treatment while eliciting the highest outcome that is of practical value to the patient.: Yes Daily medical management of patient stability for increased activity during participation in an intensive rehabilitation regime.: Yes Daily analysis of laboratory values and/or radiology reports with any subsequent need for medication adjustment of medical intervention for : Post surgical problems;Neurological problems  [name] 08/06/2014, 11:49 AM

## 2014-08-05 NOTE — Progress Notes (Signed)
Social Work Patient ID: Toni [name], female   DOB: [birth year], 68 y.o.   MRN: [id number] Met with pt to disucss team conference goals-mod/i level and discharge 12/22.  She was pleased with the goals and the plan.  Her husband has been in and observed her in therapies. She is wanting a rental wheelchair to use at home the one she has is old and from a friend.  Will ask PT for measurements.  Work toward discharge.

## 2014-08-06 ENCOUNTER — Inpatient Hospital Stay (HOSPITAL_COMMUNITY): Payer: Medicare Other

## 2014-08-06 ENCOUNTER — Encounter (HOSPITAL_COMMUNITY): Payer: Medicare Other | Admitting: Occupational Therapy

## 2014-08-06 ENCOUNTER — Inpatient Hospital Stay (HOSPITAL_COMMUNITY): Payer: Medicare Other | Admitting: Occupational Therapy

## 2014-08-06 ENCOUNTER — Other Ambulatory Visit: Payer: Self-pay

## 2014-08-06 LAB — URINE CULTURE: Colony Count: 3000

## 2014-08-06 LAB — PROTIME-INR
INR: 1.96 — AB (ref 0.00–1.49)
PROTHROMBIN TIME: 22.5 s — AB (ref 11.6–15.2)

## 2014-08-06 MED ORDER — FUROSEMIDE 40 MG PO TABS
40.0000 mg | ORAL_TABLET | Freq: Two times a day (BID) | ORAL | Status: DC
  Administered 2014-08-06: 40 mg via ORAL
  Filled 2014-08-06 (×4): qty 1

## 2014-08-06 MED ORDER — CEPHALEXIN 250 MG PO CAPS
250.0000 mg | ORAL_CAPSULE | Freq: Three times a day (TID) | ORAL | Status: DC
  Administered 2014-08-06 – 2014-08-07 (×3): 250 mg via ORAL
  Filled 2014-08-06 (×6): qty 1

## 2014-08-06 MED ORDER — ALPRAZOLAM 0.5 MG PO TABS: 0.5000 mg | ORAL_TABLET | Freq: Two times a day (BID) | ORAL | Status: DC | PRN

## 2014-08-06 MED ORDER — FUROSEMIDE 20 MG PO TABS
20.0000 mg | ORAL_TABLET | ORAL | Status: AC
  Administered 2014-08-06: 20 mg via ORAL
  Filled 2014-08-06: qty 1

## 2014-08-06 MED ORDER — WARFARIN SODIUM 7.5 MG PO TABS
7.5000 mg | ORAL_TABLET | Freq: Once | ORAL | Status: AC
  Administered 2014-08-06: 7.5 mg via ORAL
  Filled 2014-08-06: qty 1

## 2014-08-06 NOTE — Progress Notes (Signed)
Cylinder for coumadin Indication: atrial fibrillation  Allergies  Allergen Reactions  . Oxycodone     Oral oxycodone causes vomiting  . Chlorhexidine Gluconate     Sores at site  . Zoloft [Sertraline] Hives, Swelling and Rash    Patient Measurements: Height: 5\' 3"  (160 cm) Weight: 263 lb 10.7 oz (119.6 kg) IBW/kg (Calculated) : 52.4  Vital Signs: Temp: 97.8 F (36.6 C) (12/17 0548) Temp Source: Oral (12/17 0548) BP: 113/84 mmHg (12/17 0548) Pulse Rate: 55 (12/17 0548)  Labs:  Recent Labs  08/04/14 0836 08/05/14 0411 08/06/14 0551  LABPROT 20.0* 21.8* 22.5*  INR 1.68* 1.89* 1.96*  CREATININE  --  0.79  --     Estimated Creatinine Clearance: 85.4 mL/min (by C-G formula based on Cr of 0.79).  Assessment: 68 yo female on chronic coumadin for hx of afib s/p baclofen pump replacement. INR increased to 1.96 today, close to goal. Hgb and plts WNL, no bleeding noted. She has been requiring more than PTA dose  PTA dose: Coumadin 5 mg po daily, INR therapeutic on this dose in October per clinic note..    Goal of Therapy:  INR 2-3 Monitor platelets by anticoagulation protocol: Yes   Plan:  - coumadin 7.5mg  po x1 again tonight  - INR daily - follow for s/s bleeding  Maryanna Shape, PharmD, BCPS  Clinical Pharmacist  Pager: [phone]   08/06/2014 1:53 PM

## 2014-08-06 NOTE — Progress Notes (Signed)
Subjective/Complaints: 68 y.o. right handed female with history of atrial fibrillation on chronic Coumadin, multiple sclerosis diagnosed in 1989 followed by neurology services Dr. Brett Fairy. Patient lives with her husband used a walker prior to admission and was independent with assistive device. Presented 07/29/2014 with increased spasticity secondary to multiple sclerosis. Patient has a baclofen pump in place. Underwent baclofen pump replacement 07/29/2014 per Dr. Vertell Limber. Hospital course pain management as well as titration and baclofen to aid in her spasticity. Physical therapy evaluation completed 07/30/2014 with recommendations of physical medicine rehabilitation consult.PT notes plus to assist today compared to supervision with a walker yesterday  Objective:  bp's better. Feels more swollen in belly. Legs with more edema.    Vital Signs: Blood pressure 113/84, pulse 55, temperature 97.8 F (36.6 C), temperature source Oral, resp. rate 18, height '5\' 3"'  (1.6 m), weight 119.6 kg (263 lb 10.7 oz), SpO2 99 %. No results found. Results for orders placed or performed during the hospital encounter of 07/31/14 (from the past 72 hour(s))  Protime-INR     Status: Abnormal   Collection Time: 08/04/14  8:36 AM  Result Value Ref Range   Prothrombin Time 20.0 (H) 11.6 - 15.2 seconds   INR 1.68 (H) 0.00 - 1.49  Protime-INR     Status: Abnormal   Collection Time: 08/05/14  4:11 AM  Result Value Ref Range   Prothrombin Time 21.8 (H) 11.6 - 15.2 seconds   INR 1.89 (H) 0.00 - 5.83  Basic metabolic panel     Status: Abnormal   Collection Time: 08/05/14  4:11 AM  Result Value Ref Range   Sodium 142 137 - 147 mEq/L   Potassium 4.2 3.7 - 5.3 mEq/L   Chloride 102 96 - 112 mEq/L   CO2 29 19 - 32 mEq/L   Glucose, Bld 84 70 - 99 mg/dL   BUN 19 6 - 23 mg/dL   Creatinine, Ser 0.79 0.50 - 1.10 mg/dL   Calcium 9.0 8.4 - 10.5 mg/dL   GFR calc non Af Amer 84 (L) >90 mL/min   GFR calc Af Amer >90 >90 mL/min     Comment: (NOTE) The eGFR has been calculated using the CKD EPI equation. This calculation has not been validated in all clinical situations. eGFR's persistently <90 mL/min signify possible Chronic Kidney Disease.    Anion gap 11 5 - 15  Urinalysis, Routine w reflex microscopic     Status: Abnormal   Collection Time: 08/05/14  9:51 AM  Result Value Ref Range   Color, Urine YELLOW YELLOW   APPearance CLOUDY (A) CLEAR   Specific Gravity, Urine 1.020 1.005 - 1.030   pH 5.5 5.0 - 8.0   Glucose, UA NEGATIVE NEGATIVE mg/dL   Hgb urine dipstick NEGATIVE NEGATIVE   Bilirubin Urine NEGATIVE NEGATIVE   Ketones, ur NEGATIVE NEGATIVE mg/dL   Protein, ur NEGATIVE NEGATIVE mg/dL   Urobilinogen, UA 0.2 0.0 - 1.0 mg/dL   Nitrite NEGATIVE NEGATIVE   Leukocytes, UA MODERATE (A) NEGATIVE  Urine microscopic-add on     Status: Abnormal   Collection Time: 08/05/14  9:51 AM  Result Value Ref Range   Squamous Epithelial / LPF MANY (A) RARE   WBC, UA 21-50 <3 WBC/hpf   Bacteria, UA FEW (A) RARE  Protime-INR     Status: Abnormal   Collection Time: 08/06/14  5:51 AM  Result Value Ref Range   Prothrombin Time 22.5 (H) 11.6 - 15.2 seconds   INR 1.96 (H) 0.00 - 1.49  Constitutional: She is oriented to person, place, and time.  HENT:  Head: Normocephalic.  Eyes: EOM are normal.  Neck: Normal range of motion. Neck supple. No thyromegaly present.  Cardiovascular: Normal rate. An irregularly irregular rhythm present. HR in 70's. 1+ edema bilateral LE Respiratory: Effort normal and breath sounds normal. No respiratory distress.  GI: Soft. Bowel sounds are normal. She exhibits no distension. There is no tenderness.  Neurological: She is alert and oriented to person, place, and time.  Reflex Scores:  Patellar reflexes are 0 on the right side and 0 on the left side.  Achilles reflexes are 0 on the right side and 0 on the left side. Follows all commands  Reduced tone persistent in  bilateral lower limbs  Skin: Skin is warm and dry. Pump/surgical site intact Motor strength is 5/5 bilateral deltoid, biceps, triceps, grip, 3 minus right hip flexor and knee extensor ankle dorsiflexor and EHL, 4/5 and left hip flexor and knee extensor and ankle dorsiflexor and EHL Sensory intact to light touch in both upper and lower limbs with paresthesias noted in the upper extremities. Cranial nerves II through XII intact Cerebellar testing shows no evidence of dysmetria on finger-nose-finger testing in the upper extremities.   Assessment/Plan: 1. Functional deficits secondary to multiple sclerosis with pseudo-exacerbation following baclofen pump replacement which require 3+ hours per day of interdisciplinary therapy in a comprehensive inpatient rehab setting. Physiatrist is providing close team supervision and 24 hour management of active medical problems listed below. Physiatrist and rehab team continue to assess barriers to discharge/monitor patient progress toward functional and medical goals. FIM: FIM - Bathing Bathing Steps Patient Completed: Chest, Right Arm, Left Arm, Abdomen, Front perineal area, Buttocks, Right upper leg, Left upper leg, Right lower leg (including foot), Left lower leg (including foot) Bathing: 5: Supervision: Safety issues/verbal cues  FIM - Upper Body Dressing/Undressing Upper body dressing/undressing steps patient completed: Thread/unthread right sleeve of pullover shirt/dresss, Thread/unthread left sleeve of pullover shirt/dress, Put head through opening of pull over shirt/dress, Pull shirt over trunk, Thread/unthread right bra strap, Thread/unthread left bra strap, Hook/unhook bra Upper body dressing/undressing: 5: Set-up assist to: Obtain clothing/put away FIM - Lower Body Dressing/Undressing Lower body dressing/undressing steps patient completed: Thread/unthread right underwear leg, Thread/unthread left underwear leg, Thread/unthread right pants leg,  Thread/unthread left pants leg Lower body dressing/undressing: 3: Mod-Patient completed 50-74% of tasks  FIM - Toileting Toileting steps completed by patient: Adjust clothing prior to toileting, Performs perineal hygiene, Adjust clothing after toileting Toileting Assistive Devices: Grab bar or rail for support Toileting: 5: Supervision: Safety issues/verbal cues  FIM - Radio producer Devices: Product manager Transfers: 5-To toilet/BSC: Supervision (verbal cues/safety issues), 5-From toilet/BSC: Supervision (verbal cues/safety issues)  FIM - Control and instrumentation engineer Devices: Copy: 6: Supine > Sit: No assist, 6: Sit > Supine: No assist, 5: Bed > Chair or W/C: Supervision (verbal cues/safety issues), 5: Chair or W/C > Bed: Supervision (verbal cues/safety issues)  FIM - Locomotion: Wheelchair Distance: 75'  Locomotion: Wheelchair: 2: Travels 94 - 149 ft with supervision, cueing or coaxing FIM - Locomotion: Ambulation Locomotion: Ambulation Assistive Devices: Administrator Ambulation/Gait Assistance: 5: Supervision Locomotion: Ambulation: 1: Travels less than 50 ft with supervision/safety issues  Comprehension Comprehension Mode: Auditory Comprehension: 7-Follows complex conversation/direction: With no assist  Expression Expression Mode: Verbal Expression: 6-Expresses complex ideas: With extra time/assistive device  Social Interaction Social Interaction: 6-Interacts appropriately with others with medication or extra time (anti-anxiety, antidepressant).  Problem Solving Problem Solving: 6-Solves complex problems: With extra time  Memory Memory: 7-Complete Independence: No helper  Medical Problem List and Plan: 1. Functional deficits secondary to multiple sclerosis with pseudo-exacerbation following baclofen pump replacement  -interrogated pump today--reduced basal rate to 27mg/hr  -pump alarm date is  now 11/14/14 2. DVT Prophylaxis/Anticoagulation: Chronic Coumadin for atrial fibrillation. Monitor for any bleeding episodes 3. Pain Management: Neurontin 300 mg 3 times a day, hydrocodone as needed. Monitor with increased mobility 4. Mood/depression/anxiety: Wellbutrin 300 mg daily, Xanax 0.5 mg twice a day as needed. Provide emotional support  -  neuropsych to see for anxiety and cognitive assessment 5. Neuropsych: This patient is capable of making decisions on her own behalf. 6. Skin/Wound Care: Routine skin checks 7. Fluids/Electrolytes/Nutrition: Strict I and O's. Follow-up chemistries in am   -encourage adequate fluid intake  8. Hypertension. Lasix 40 mg twice a day-resume this dose today  - Bystolic 20 mg daily.    -bp's look better with adjustment that have been made  -bilateral LE ACE wraps 9. Hypothyroidism..Marland KitchenSynthroid. 10. Hyperlipidemia. 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 Adjusedt bowel program.allow lactulose bid,    -Continue Flomax 0.4 mg daily. Check PVRs 3   -check ua +, cx pending---increase keflex to tid for now 12. Rash---likely contact or heat--only on belly/inguinal area  -one dose prednisone  -steroid cream prn  -benadryl prn  -hypoallergenic sheets LOS (Days) 6    A FACE TO FACE EVALUATION WAS PERFORMED  Toni [name] T 08/06/2014, 9:07 AM

## 2014-08-06 NOTE — Progress Notes (Signed)
Patient stated she places herself on CPAP before bed.

## 2014-08-06 NOTE — Progress Notes (Signed)
Physical Therapy Session Note  Patient Details  Name: Toni [name] MRN: [id number] Date of Birth: [birth year]  Today's Date: 08/06/2014 PT Individual Time: 1430-1500 PT Individual Time Calculation (min): 30 min   Short Term Goals: Week 1:  PT Short Term Goal 1 (Week 1): Pt will be supervision for bed mobility PT Short Term Goal 2 (Week 1): Pt will be min guard for transfers PT Short Term Goal 3 (Week 1): Pt will ambulate 83' with Min A PT Short Term Goal 4 (Week 1): Pt will propel w/c 100' with supervision  Skilled Therapeutic Interventions/Progress Updates:    Assisted pt to and from bathroom with S overall x 2. NT reports BP was low in supine but pt asymptomatic. Focused on reviewing supine HEP for stretches to decrease muscle tightness and increased ROM with pt using handout to lead through exercises. Completed stretches x 3 reps each x 30 second hold for piriformis stretch, knee to chest, hamstring stretch, and trunk rotation. Transferred to toilet end of session again with instructions to pull call bell when finished. Performed bed <-> chair transfers mod I.   Therapy Documentation Precautions:  Precautions Precautions: Fall Precaution Comments: watch gait belt placement due to incision (axillae preferable) Restrictions Weight Bearing Restrictions: No  See FIM for current functional status  Therapy/Group: Individual Therapy  [name] [facility] 08/06/2014, 2:29 PM

## 2014-08-06 NOTE — Progress Notes (Signed)
Social Work [name], LCSW Social Worker Signed  Patient Care Conference 08/05/2014  8:45 AM    Expand All Collapse All   Inpatient RehabilitationTeam Conference and Plan of Care Update Date: 08/04/2014   Time: 14;40 PM     Patient Name: Toni [name]       Medical Record Number: [id number]  Date of Birth: 01-05-1946 Sex: Female         Room/Bed: 4W21C/4W21C-01 Payor Info: Payor: MEDICARE / Plan: MEDICARE PART A AND B / Product Type: *No Product type* /    Admitting Diagnosis: STERN MS BACLOFEN PUMP REPLACEMENT  Admit Date/Time:  07/31/2014  2:10 PM Admission Comments: No comment available   Primary Diagnosis:  MS (multiple sclerosis) Principal Problem: MS (multiple sclerosis)    Patient Active Problem List     Diagnosis  Date Noted   .  MS (multiple sclerosis)  07/29/2014   .  Closed right ankle fracture  10/30/2013   .  Leucocytosis  10/30/2013   .  Ankle fracture  10/30/2013   .  Varicose veins of lower extremities with other complications  49/82/6415   .  Paroxysmal a-fib  06/04/2013   .  Morbid obesity  06/04/2013   .  OSA on CPAP  06/04/2013   .  Muscle spasms of lower extremity  02/24/2013   .  Long term (current) use of anticoagulants  11/05/2012   .  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  Aspiration pneumonia vs Failed outpatient Rx of CAP  11/26/2011   .  Hypertension     .  Campath-induced atrial fibrillation     .  Thyroid disease     .  HLD (hyperlipidemia)       Expected Discharge Date: Expected Discharge Date: 08/11/14  Team Members Present: Physician leading conference: Dr. Alger Simons Social Worker Present: Ovidio Kin, LCSW Nurse Present: Elliot Cousin, RN PT Present: [name], Lorriane Shire, PT OT Present: Salome Spotted, OT;Patricia Lissa Hoard, OT SLP Present: Weston Anna, SLP PPS Coordinator present : Daiva Nakayama, RN, CRRN        Current Status/Progress  Goal  Weekly Team Focus    Medical     ms with pseudoexacerbation. baclofen pump replaced. a littlle dry, pump titrated down  improve stamina, balance  bp and tone control, pump management.     Bowel/Bladder     Continent to bowel and bladder.  To continue continent to bowel and bladder.   To monitor bladder and bowel function Q shift.    Swallow/Nutrition/ Hydration               ADL's     Supervision for bathing, UB dressing, grooming tasks - min assist for functional transfers using RW -  mod assist for LB dressing - total assist for toileting  overall mod I, except supervision for shower transfer   functional transfers, functional mobility, ADL retraining, overall functional activkity tolerance/endurance, energy conservation, education regarding all   Mobility     min A overall with RW short distances; orthostasis and endurance limiting  mod I overall with RW household distances; mod I w/c propulsion for strength and endurance  endurance, standing balance, gait training, functional strengthening, transfers   Communication       na         Safety/Cognition/ Behavioral Observations      no unsafe behaviors         Pain  No complain of pain.  Keep pain level less than 3,on scale 1 to 19  Assess for pain levels Q 2-3 hrs.   Skin     Rash on flank area,reedness around groin area.Cortisone cream PRN.  To prevent any skin break down.  To monitore skin Q shift.    Rehab Goals Patient on target to meet rehab goals: Yes *See Care Plan and progress notes for long and short-term goals.    Barriers to Discharge:  poor stamina, weakness     Possible Resolutions to Barriers:   continued tone mgt, improve strength, spacing out therapies.     Discharge Planning/Teaching Needs:   home with husband who can provide 24/7 assistacne       Team Discussion:    Adjusted baclofen pump-better in am tires in the pm. Space out therapies-goals mod/i level.    Revisions to Treatment Plan:    None    Continued Need for  Acute Rehabilitation Level of Care: The patient requires daily medical management by a physician with specialized training in physical medicine and rehabilitation for the following conditions: Daily direction of a multidisciplinary physical rehabilitation program to ensure safe treatment while eliciting the highest outcome that is of practical value to the patient.: Yes Daily medical management of patient stability for increased activity during participation in an intensive rehabilitation regime.: Yes Daily analysis of laboratory values and/or radiology reports with any subsequent need for medication adjustment of medical intervention for : Post surgical problems;Neurological problems  [name] 08/06/2014, 11:49 AM                  Patient ID: Toni [name], female   DOB: 10/23/1945, 68 y.o.   MRN: [id number]

## 2014-08-06 NOTE — Progress Notes (Signed)
Physical Therapy Session Note  Patient Details  Name: Toni [name] MRN: [id number] Date of Birth: 1945/12/01  Today's Date: 08/06/2014  Short Term Goals: Week 1:  PT Short Term Goal 1 (Week 1): Pt will be supervision for bed mobility PT Short Term Goal 2 (Week 1): Pt will be min guard for transfers PT Short Term Goal 3 (Week 1): Pt will ambulate 45' with Min A PT Short Term Goal 4 (Week 1): Pt will propel w/c 100' with supervision  Session #1: PT Individual Time: 1300-1330 PT Individual Time Calculation (min): 30 min  Denies pain. Pt increased frequency and urgency of urination due to medication and performed toileting at beginning and end of session at supervision level transferring from w/c <-> toilet using grab bars. The other part of session focused on functional endurance and activity tolerance, gait with RW x 60' with S and cues for upright posture and knee/hip extension, and Nustep on level 4 x 10 min with BUE and BLE.   Therapy Documentation Precautions:  Precautions Precautions: Fall Precaution Comments: watch gait belt placement due to incision (axillae preferable) Restrictions Weight Bearing Restrictions: No   See FIM for current functional status  Therapy/Group: Individual Therapy  [name] [facility] 08/06/2014, 2:26 PM

## 2014-08-06 NOTE — Progress Notes (Signed)
Occupational Therapy Session Notes  Patient Details  Name: Toni [name] MRN: [id number] Date of Birth: 24-Nov-1945  Short Term Goals: Week 1:  OT Short Term Goal 1 (Week 1): Pt will perform bathing with Min A in order to increase I in self care.  OT Short Term Goal 2 (Week 1): Pt will perform 2/3 tasks of toileting in order to increase I in self care.  OT Short Term Goal 3 (Week 1): Pt will perform shower transfer with Min A in order to increase I in self care. OT Short Term Goal 4 (Week 1): Pt will engage in 20 minutes of functional task with no more than 1 rest break secondary to fatigue.  Skilled Therapeutic Interventions/Progress Updates:   Session #1 [phone] - 60 Minutes Individual Therapy Patient with complaints of discomfort around trunk area, RN aware Patient received supine in bed. Patient engaged in bed mobility at mod I level and sat EOB to get acclimated.  Patient talked with therapist about earlier discussion with Dr. Naaman Plummer this am, patient needs ace wraps > BLEs instead of TEDs due to increased swelling and TEDs starting to create a tunicate effect. Patient ambulated from EOB > BR for toilet transfer, then shower stall transfer for UB/LB bathing in sit<>stand position. Patient ambulated out of shower and > w/c set-up near sink. Patient then completed grooming tasks seated in w/c at sink. Patient gathered clothing, and therapist assisted with donning of ace wraps. Secondary to time, therapist left patient seated in w/c to start dressing tasks, notified NT of patient's whereabouts; patient with all needs within reach and patient aware not to stand without staff present.   Session #2 1330-1400 - 30 Minutes Individual Therapy No complaints of pain Patient received in bathroom after urine attempt, no luck. Patient transferred off toilet seat with supervision. Patient then propelled self > therapy gym for therapeutic activity focusing on sit<>stands, dynamic standing  balance/tolerance/endurance, functional use of right hand, and overall activity tolerance/endurance. Patient stood X4, patient able to stand for max of 1.5 minutes. Therapist assisted patient half-way back to room and patient eager to propel self rest of the way. At end of session, left patient propelling self back to room.   Precautions:  Precautions Precautions: Fall Precaution Comments: watch gait belt placement due to incision (axillae preferable) Restrictions Weight Bearing Restrictions: No  See FIM for current functional status  Sapna Padron 08/06/2014, 7:24 AM

## 2014-08-06 NOTE — Progress Notes (Signed)
Physical Therapy Session Note  Patient Details  Name: Toni [name] MRN: [id number] Date of Birth: [birth year]  Today's Date: 08/06/2014 PT Individual Time: 0900-0930 PT Individual Time Calculation (min): 30 min   Short Term Goals: Week 1:  PT Short Term Goal 1 (Week 1): Pt will be supervision for bed mobility PT Short Term Goal 2 (Week 1): Pt will be min guard for transfers PT Short Term Goal 3 (Week 1): Pt will ambulate 40' with Min A PT Short Term Goal 4 (Week 1): Pt will propel w/c 100' with supervision  Skilled Therapeutic Interventions/Progress Updates:   Pt transferring out of bathroom at beginning of session and assisted pt with transfer to toilet at end of session with overall S. Focused session on functional strengthening and endurance and functional mobility training for gait training with RW with focus on increasing knee extension, maintaining upright posture, and increasing gait distance on tiled and carpeted surfaces with obstacles to simulate home environment mobility. Gait x 60', x 30', x 70 to fatigue.   Therapy Documentation Precautions:  Precautions Precautions: Fall Precaution Comments: watch gait belt placement due to incision (axillae preferable) Restrictions Weight Bearing Restrictions: No  Pain:  Denies pain. C/o not knowing what is causing this rash on her body. Locomotion : Ambulation Ambulation/Gait Assistance: 5: Supervision   See FIM for current functional status  Therapy/Group: Individual Therapy  [name] [facility] 08/06/2014, 11:39 AM

## 2014-08-06 NOTE — Plan of Care (Signed)
Problem: RH SKIN INTEGRITY Goal: RH STG SKIN FREE OF INFECTION/BREAKDOWN Outcome: Progressing With min. assisst.  Problem: RH PAIN MANAGEMENT Goal: RH STG PAIN MANAGED AT OR BELOW PT'S PAIN GOAL Outcome: Completed/Met Date Met:  08/06/14 Less tha 3,on scale 1 to 10

## 2014-08-06 NOTE — Telephone Encounter (Signed)
Rx signed and faxed.

## 2014-08-07 ENCOUNTER — Inpatient Hospital Stay (HOSPITAL_COMMUNITY): Payer: Medicare Other | Admitting: Occupational Therapy

## 2014-08-07 ENCOUNTER — Inpatient Hospital Stay (HOSPITAL_COMMUNITY): Payer: Medicare Other

## 2014-08-07 ENCOUNTER — Encounter (HOSPITAL_COMMUNITY): Payer: Medicare Other | Admitting: Occupational Therapy

## 2014-08-07 LAB — BASIC METABOLIC PANEL
Anion gap: 9 (ref 5–15)
BUN: 17 mg/dL (ref 6–23)
CO2: 32 mEq/L (ref 19–32)
CREATININE: 0.76 mg/dL (ref 0.50–1.10)
Calcium: 9 mg/dL (ref 8.4–10.5)
Chloride: 102 mEq/L (ref 96–112)
GFR calc Af Amer: >90 mL/min (ref >90)
GFR, EST NON AFRICAN AMERICAN: 85 mL/min — AB (ref >90)
Glucose, Bld: 84 mg/dL (ref 70–99)
Potassium: 3.9 mEq/L (ref 3.7–5.3)
Sodium: 143 mEq/L (ref 137–147)

## 2014-08-07 LAB — PROTIME-INR
INR: 2.17 — ABNORMAL HIGH (ref 0.00–1.49)
PROTHROMBIN TIME: 24.3 s — AB (ref 11.6–15.2)

## 2014-08-07 MED ORDER — WARFARIN SODIUM 5 MG PO TABS
5.0000 mg | ORAL_TABLET | Freq: Once | ORAL | Status: AC
  Administered 2014-08-07: 5 mg via ORAL
  Filled 2014-08-07: qty 1

## 2014-08-07 MED ORDER — CEPHALEXIN 250 MG PO CAPS
250.0000 mg | ORAL_CAPSULE | Freq: Every day | ORAL | Status: DC
  Administered 2014-08-07 – 2014-08-10 (×4): 250 mg via ORAL
  Filled 2014-08-07 (×5): qty 1

## 2014-08-07 MED ORDER — CEPHALEXIN 250 MG PO CAPS: 250.0000 mg | ORAL_CAPSULE | Freq: Every day | ORAL | Status: DC

## 2014-08-07 MED ORDER — FUROSEMIDE 20 MG PO TABS
20.0000 mg | ORAL_TABLET | Freq: Every day | ORAL | Status: DC
  Administered 2014-08-07 – 2014-08-10 (×4): 20 mg via ORAL
  Filled 2014-08-07 (×5): qty 1

## 2014-08-07 MED ORDER — MAGNESIUM HYDROXIDE 400 MG/5ML PO SUSP
15.0000 mL | Freq: Once | ORAL | Status: AC
  Administered 2014-08-07: 15 mL via ORAL
  Filled 2014-08-07: qty 30

## 2014-08-07 MED ORDER — FUROSEMIDE 40 MG PO TABS
40.0000 mg | ORAL_TABLET | Freq: Every day | ORAL | Status: DC
  Administered 2014-08-07 – 2014-08-11 (×5): 40 mg via ORAL
  Filled 2014-08-07 (×7): qty 1

## 2014-08-07 NOTE — Progress Notes (Signed)
Subjective/Complaints: 68 y.o. right handed female with history of atrial fibrillation on chronic Coumadin, multiple sclerosis diagnosed in 1989 followed by neurology services Dr. Brett Fairy. Patient lives with her husband used a walker prior to admission and was independent with assistive device. Presented 07/29/2014 with increased spasticity secondary to multiple sclerosis. Patient has a baclofen pump in place. Underwent baclofen pump replacement 07/29/2014 per Dr. Vertell Limber. Hospital course pain management as well as titration and baclofen to aid in her spasticity. Physical therapy evaluation completed 07/30/2014 with recommendations of physical medicine rehabilitation consult.PT notes plus to assist today compared to supervision with a walker yesterday  Objective: Feels swelling a little better but BP dropped slightly yesterday. Rash improving.    Vital Signs: Blood pressure 128/54, pulse 68, temperature 97.9 F (36.6 C), temperature source Oral, resp. rate 18, height $RemoveBe'5\' 3"'pVafWYTKT$  (1.6 m), weight 119.6 kg (263 lb 10.7 oz), SpO2 100 %. No results found. Results for orders placed or performed during the hospital encounter of 07/31/14 (from the past 72 hour(s))  Protime-INR     Status: Abnormal   Collection Time: 08/04/14  8:36 AM  Result Value Ref Range   Prothrombin Time 20.0 (H) 11.6 - 15.2 seconds   INR 1.68 (H) 0.00 - 1.49  Protime-INR     Status: Abnormal   Collection Time: 08/05/14  4:11 AM  Result Value Ref Range   Prothrombin Time 21.8 (H) 11.6 - 15.2 seconds   INR 1.89 (H) 0.00 - 7.32  Basic metabolic panel     Status: Abnormal   Collection Time: 08/05/14  4:11 AM  Result Value Ref Range   Sodium 142 137 - 147 mEq/L   Potassium 4.2 3.7 - 5.3 mEq/L   Chloride 102 96 - 112 mEq/L   CO2 29 19 - 32 mEq/L   Glucose, Bld 84 70 - 99 mg/dL   BUN 19 6 - 23 mg/dL   Creatinine, Ser 0.79 0.50 - 1.10 mg/dL   Calcium 9.0 8.4 - 10.5 mg/dL   GFR calc non Af Amer 84 (L) >90 mL/min   GFR calc Af  Amer >90 >90 mL/min    Comment: (NOTE) The eGFR has been calculated using the CKD EPI equation. This calculation has not been validated in all clinical situations. eGFR's persistently <90 mL/min signify possible Chronic Kidney Disease.    Anion gap 11 5 - 15  Urinalysis, Routine w reflex microscopic     Status: Abnormal   Collection Time: 08/05/14  9:51 AM  Result Value Ref Range   Color, Urine YELLOW YELLOW   APPearance CLOUDY (A) CLEAR   Specific Gravity, Urine 1.020 1.005 - 1.030   pH 5.5 5.0 - 8.0   Glucose, UA NEGATIVE NEGATIVE mg/dL   Hgb urine dipstick NEGATIVE NEGATIVE   Bilirubin Urine NEGATIVE NEGATIVE   Ketones, ur NEGATIVE NEGATIVE mg/dL   Protein, ur NEGATIVE NEGATIVE mg/dL   Urobilinogen, UA 0.2 0.0 - 1.0 mg/dL   Nitrite NEGATIVE NEGATIVE   Leukocytes, UA MODERATE (A) NEGATIVE  Culture, Urine     Status: None   Collection Time: 08/05/14  9:51 AM  Result Value Ref Range   Specimen Description URINE, CLEAN CATCH    Special Requests NONE    Culture  Setup Time      08/05/2014 14:46 Performed at [facility]      3,000 COLONIES/ML Performed at [facility]      INSIGNIFICANT GROWTH Performed at [facility]    Report Status 08/06/2014 FINAL   Urine microscopic-add on     Status: Abnormal   Collection Time: 08/05/14  9:51 AM  Result Value Ref Range   Squamous Epithelial / LPF MANY (A) RARE   WBC, UA 21-50 <3 WBC/hpf   Bacteria, UA FEW (A) RARE  Protime-INR     Status: Abnormal   Collection Time: 08/06/14  5:51 AM  Result Value Ref Range   Prothrombin Time 22.5 (H) 11.6 - 15.2 seconds   INR 1.96 (H) 0.00 - 1.49  Protime-INR     Status: Abnormal   Collection Time: 08/07/14  6:11 AM  Result Value Ref Range   Prothrombin Time 24.3 (H) 11.6 - 15.2 seconds   INR 2.17 (H) 0.00 - 5.72  Basic metabolic panel     Status: Abnormal   Collection Time: 08/07/14  6:11 AM  Result Value Ref Range   Sodium 143 137 -  147 mEq/L   Potassium 3.9 3.7 - 5.3 mEq/L   Chloride 102 96 - 112 mEq/L   CO2 32 19 - 32 mEq/L   Glucose, Bld 84 70 - 99 mg/dL   BUN 17 6 - 23 mg/dL   Creatinine, Ser 0.76 0.50 - 1.10 mg/dL   Calcium 9.0 8.4 - 10.5 mg/dL   GFR calc non Af Amer 85 (L) >90 mL/min   GFR calc Af Amer >90 >90 mL/min    Comment: (NOTE) The eGFR has been calculated using the CKD EPI equation. This calculation has not been validated in all clinical situations. eGFR's persistently <90 mL/min signify possible Chronic Kidney Disease.    Anion gap 9 5 - 15      Constitutional: She is oriented to person, place, and time.  HENT:  Head: Normocephalic.  Eyes: EOM are normal.  Neck: Normal range of motion. Neck supple. No thyromegaly present.  Cardiovascular: Normal rate. An irregularly irregular rhythm present. HR in 70's. 1+ edema bilateral LE Respiratory: Effort normal and breath sounds normal. No respiratory distress.  GI: Soft. Bowel sounds are normal. She exhibits no distension. There is no tenderness.  Neurological: She is alert and oriented to person, place, and time.  Reflex Scores:  Patellar reflexes are 0 on the right side and 0 on the left side.  Achilles reflexes are 0 on the right side and 0 on the left side. Follows all commands reduced tone persistent in bilateral lower limbs  Motor strength is 5/5 bilateral deltoid, biceps, triceps, grip, 3 minus right hip flexor and knee extensor ankle dorsiflexor and EHL, 4/5 and left hip flexor and knee extensor and ankle dorsiflexor and EHL Sensory intact to light touch in both upper and lower limbs with paresthesias noted in the upper extremities. Cranial nerves II through XII intact   Skin: Skin is warm and dry. Pump/surgical site intact. Maculopapular rash resolving on abdomen and flanks  Assessment/Plan: 1. Functional deficits secondary to multiple sclerosis with pseudo-exacerbation following baclofen pump replacement which require 3+ hours  per day of interdisciplinary therapy in a comprehensive inpatient rehab setting. Physiatrist is providing close team supervision and 24 hour management of active medical problems listed below. Physiatrist and rehab team continue to assess barriers to discharge/monitor patient progress toward functional and medical goals. FIM: FIM - Bathing Bathing Steps Patient Completed: Chest, Right Arm, Left Arm, Abdomen, Front perineal area, Buttocks, Right upper leg, Left upper leg, Right lower leg (including foot), Left lower leg (including foot) Bathing: 5: Supervision: Safety issues/verbal cues  FIM - Upper Body Dressing/Undressing Upper body dressing/undressing steps patient completed: Thread/unthread right sleeve of pullover shirt/dresss, Thread/unthread left sleeve of pullover shirt/dress, Put head through opening of pull over shirt/dress, Pull shirt over trunk, Thread/unthread right bra strap, Thread/unthread left bra strap, Hook/unhook bra Upper body dressing/undressing: 5: Set-up assist to: Obtain clothing/put away FIM - Lower Body Dressing/Undressing Lower body dressing/undressing steps patient completed: Thread/unthread right underwear leg, Thread/unthread left underwear leg, Thread/unthread right pants leg, Thread/unthread left pants leg Lower body dressing/undressing: 3: Mod-Patient completed 50-74% of tasks  FIM - Toileting Toileting steps completed by patient: Adjust clothing prior to toileting, Performs perineal hygiene, Adjust clothing after toileting Toileting Assistive Devices: Grab bar or rail for support Toileting: 5: Supervision: Safety issues/verbal cues  FIM - Radio producer Devices: Product manager Transfers: 5-To toilet/BSC: Supervision (verbal cues/safety issues), 5-From toilet/BSC: Supervision (verbal cues/safety issues)  FIM - Control and instrumentation engineer Devices: Copy: 6: Assistive device: no  helper  FIM - Locomotion: Wheelchair Distance: 75'  Locomotion: Wheelchair: 2: Travels 50 - 149 ft with supervision, cueing or coaxing FIM - Locomotion: Ambulation Locomotion: Ambulation Assistive Devices: Administrator Ambulation/Gait Assistance: 5: Supervision Locomotion: Ambulation: 2: Travels 50 - 149 ft with supervision/safety issues  Comprehension Comprehension Mode: Auditory Comprehension: 7-Follows complex conversation/direction: With no assist  Expression Expression Mode: Verbal Expression: 6-Expresses complex ideas: With extra time/assistive device  Social Interaction Social Interaction: 6-Interacts appropriately with others with medication or extra time (anti-anxiety, antidepressant).  Problem Solving Problem Solving: 6-Solves complex problems: With extra time  Memory Memory: 7-Complete Independence: No helper  Medical Problem List and Plan: 1. Functional deficits secondary to multiple sclerosis with pseudo-exacerbation following baclofen pump replacement  -interrogated pump 12/14-- I reduced basal rate to 70mg/hr---same overnight rate  -pump alarm date is now 11/14/14 2. DVT Prophylaxis/Anticoagulation: Chronic Coumadin for atrial fibrillation. Monitor for any bleeding episodes 3. Pain Management: Neurontin 300 mg 3 times a day, hydrocodone as needed. Monitor with increased mobility 4. Mood/depression/anxiety: Wellbutrin 300 mg daily, Xanax 0.5 mg twice a day as needed. Provide emotional support  -  neuropsych to see for anxiety and cognitive assessment 5. Neuropsych: This patient is capable of making decisions on her own behalf. 6. Skin/Wound Care: Routine skin checks 7. Fluids/Electrolytes/Nutrition: Strict I and O's. Follow-up chemistries in am   -encourage adequate fluid intake  8. Hypertension. Lasix 40 mg twice a day tends to drop her bp  -changed lasix to 496mam and 2099MEm  - Bystolic 20 mg daily---would like to continue to control HR.    -bilateral  LE ACE wraps and elevation for edema 9. Hypothyroidism.. Marland Kitchenynthroid. 10. Hyperlipidemia. 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 Adjusedt bowel program.allow lactulose bid,    -Continue Flomax 0.4 mg daily.    -ucx neg---changed keflex back to the QHS dose 12. Rash---likely contact or heat--only on belly/inguinal area  -improved  -steroid cream prn  -benadryl prn  -hypoallergenic sheets  -advised her to use gauze or small rags in inguinal folds along with the microguard powder to help with yeast in skin folds LOS (Days) 7    A FACE TO FACE EVALUATION WAS PERFORMED  Armida Vickroy T 08/07/2014, 7:45 AM

## 2014-08-07 NOTE — Progress Notes (Signed)
[facility] for coumadin Indication: atrial fibrillation  Allergies  Allergen Reactions  . Oxycodone     Oral oxycodone causes vomiting  . Chlorhexidine Gluconate     Sores at site  . Zoloft [Sertraline] Hives, Swelling and Rash    Patient Measurements: Height: 5\' 3"  (160 cm) Weight: 263 lb 10.7 oz (119.6 kg) IBW/kg (Calculated) : 52.4  Vital Signs: Temp: 97.9 F (36.6 C) (12/18 0456) Temp Source: Oral (12/18 0456) BP: 128/54 mmHg (12/18 0456) Pulse Rate: 68 (12/18 0456)  Labs:  Recent Labs  08/05/14 0411 08/06/14 0551 08/07/14 0611  LABPROT 21.8* 22.5* 24.3*  INR 1.89* 1.96* 2.17*  CREATININE 0.79  --  0.76    Estimated Creatinine Clearance: 85.4 mL/min (by C-G formula based on Cr of 0.76).  Assessment: 68 yo female on chronic coumadin for hx of afib s/p baclofen pump replacement. INR increased to 2.17, at goal now. No new cbc, no bleeding noted. She has been requiring more than PTA dose  PTA dose: Coumadin 5 mg po daily, INR therapeutic on this dose in October per clinic note..    Goal of Therapy:  INR 2-3 Monitor platelets by anticoagulation protocol: Yes   Plan:  - coumadin 5mg  po x1 again tonight  - INR daily - follow for s/s bleeding  Maryanna Shape, PharmD, BCPS  Clinical Pharmacist  Pager: [phone]   08/07/2014 1:35 PM

## 2014-08-07 NOTE — Discharge Instructions (Addendum)
Inpatient Rehab Discharge Instructions  Toni [name] Discharge date and time: 08/11/14   Activities/Precautions/ Functional Status: Activity: activity as tolerated Diet: regular diet Wound Care: none needed Functional status:  ___ No restrictions     ___ Walk up steps independently ___ 24/7 supervision/assistance   ___ Walk up steps with assistance _X__ Intermittent supervision/assistance  ___ Bathe/dress independently ___ Walk with walker     ___ Bathe/dress with assistance ___ Walk Independently    ___ Shower independently ___ Walk with assistance    ___ Shower with assistance _X__ No alcohol     ___ Return to work/school ________   COMMUNITY REFERRALS UPON DISCHARGE:    Home Health:   PT                     Agency: [facility] Phone: [phone]   Medical Equipment/Items Ordered: wheelchair, cushion                                                      Agency/Supplier: [facility] [phone]   Special Instructions: 1. Wear support stocking or ace wrap legs to help with swelling.  2. Contact Dr. Vertell Limber if you develop redness, swelling, drainage from incision OR  fever and chills.  3. Home Health RN to draw labs on 12/24 with result to coumadin clinic.    My questions have been answered and I understand these instructions. I will adhere to these goals and the provided educational materials after my discharge from the hospital.  Patient/Caregiver Signature _______________________________ Date __________  Clinician Signature _______________________________________ Date __________  Please bring this form and your medication list with you to all your follow-up doctor's appointments.   Information on my medicine - Coumadin   (Warfarin)  This medication education was reviewed with me or my healthcare representative as part of my discharge preparation.   Why was Coumadin prescribed for you? Coumadin was prescribed for you because you have a blood clot or a medical  condition that can cause an increased risk of forming blood clots. Blood clots can cause serious health problems by blocking the flow of blood to the heart, lung, or brain. Coumadin can prevent harmful blood clots from forming. As a reminder your indication for Coumadin is:   Stroke Prevention Because Of Atrial Fibrillation  What test will check on my response to Coumadin? While on Coumadin (warfarin) you will need to have an INR test regularly to ensure that your dose is keeping you in the desired range. The INR (international normalized ratio) number is calculated from the result of the laboratory test called prothrombin time (PT).  If an INR APPOINTMENT HAS NOT ALREADY BEEN MADE FOR YOU please schedule an appointment to have this lab work done by your health care provider within 7 days. Your INR goal is usually a number between:  2 to 3 or your provider may give you a more narrow range like 2-2.5.  Ask your health care provider during an office visit what your goal INR is.  What  do you need to  know  About  COUMADIN? Take Coumadin (warfarin) exactly as prescribed by your healthcare provider about the same time each day.  DO NOT stop taking without talking to the doctor who prescribed the medication.  Stopping without other blood clot prevention medication to take the  place of Coumadin may increase your risk of developing a new clot or stroke.  Get refills before you run out.  What do you do if you miss a dose? If you miss a dose, take it as soon as you remember on the same day then continue your regularly scheduled regimen the next day.  Do not take two doses of Coumadin at the same time.  Important Safety Information A possible side effect of Coumadin (Warfarin) is an increased risk of bleeding. You should call your healthcare provider right away if you experience any of the following: ? Bleeding from an injury or your nose that does not stop. ? Unusual colored urine (red or dark brown) or  unusual colored stools (red or black). ? Unusual bruising for unknown reasons. ? A serious fall or if you hit your head (even if there is no bleeding).  Some foods or medicines interact with Coumadin (warfarin) and might alter your response to warfarin. To help avoid this: ? Eat a balanced diet, maintaining a consistent amount of Vitamin K. ? Notify your provider about major diet changes you plan to make. ? Avoid alcohol or limit your intake to 1 drink for women and 2 drinks for men per day. (1 drink is 5 oz. wine, 12 oz. beer, or 1.5 oz. liquor.)  Make sure that ANY health care provider who prescribes medication for you knows that you are taking Coumadin (warfarin).  Also make sure the healthcare provider who is monitoring your Coumadin knows when you have started a new medication including herbals and non-prescription products.  Coumadin (Warfarin)  Major Drug Interactions  Increased Warfarin Effect Decreased Warfarin Effect  Alcohol (large quantities) Antibiotics (esp. Septra/Bactrim, Flagyl, Cipro) Amiodarone (Cordarone) Aspirin (ASA) Cimetidine (Tagamet) Megestrol (Megace) NSAIDs (ibuprofen, naproxen, etc.) Piroxicam (Feldene) Propafenone (Rythmol SR) Propranolol (Inderal) Isoniazid (INH) Posaconazole (Noxafil) Barbiturates (Phenobarbital) Carbamazepine (Tegretol) Chlordiazepoxide (Librium) Cholestyramine (Questran) Griseofulvin Oral Contraceptives Rifampin Sucralfate (Carafate) Vitamin K   Coumadin (Warfarin) Major Herbal Interactions  Increased Warfarin Effect Decreased Warfarin Effect  Garlic Ginseng Ginkgo biloba Coenzyme Q10 Green tea St. Johns wort    Coumadin (Warfarin) FOOD Interactions  Eat a consistent number of servings per week of foods HIGH in Vitamin K (1 serving =  cup)  Collards (cooked, or boiled & drained) Kale (cooked, or boiled & drained) Mustard greens (cooked, or boiled & drained) Parsley *serving size only =  cup Spinach (cooked, or  boiled & drained) Swiss chard (cooked, or boiled & drained) Turnip greens (cooked, or boiled & drained)  Eat a consistent number of servings per week of foods MEDIUM-HIGH in Vitamin K (1 serving = 1 cup)  Asparagus (cooked, or boiled & drained) Broccoli (cooked, boiled & drained, or raw & chopped) Brussel sprouts (cooked, or boiled & drained) *serving size only =  cup Lettuce, raw (green leaf, endive, romaine) Spinach, raw Turnip greens, raw & chopped   These websites have more information on Coumadin (warfarin):  FailFactory.se; VeganReport.com.au;

## 2014-08-07 NOTE — Progress Notes (Signed)
Occupational Therapy Session Note  Patient Details  Name: SARH KIRSCHENBAUM MRN: [id number] Date of Birth: [birth year]  Today's Date: 08/07/2014 OT Individual Time: 6203-5597 OT Individual Time Calculation (min): 60 min  Short Term Goals: Week 1:  OT Short Term Goal 1 (Week 1): Pt will perform bathing with Min A in order to increase I in self care.  OT Short Term Goal 2 (Week 1): Pt will perform 2/3 tasks of toileting in order to increase I in self care.  OT Short Term Goal 3 (Week 1): Pt will perform shower transfer with Min A in order to increase I in self care. OT Short Term Goal 4 (Week 1): Pt will engage in 20 minutes of functional task with no more than 1 rest break secondary to fatigue.  Skilled Therapeutic Interventions/Progress Updates:  Patient received supine in bed with obvious distress. Patient with complaints of burning sensation in bilateral hands and BLEs (from knees down). Patient stated this was a sudden onset that occurred just a few minutes prior to therapist entering room. Patient made RN aware of discomfort. Patient took extra time to engage in bed mobility and transfer OOB. Patient stood with RW and attempted to ambulate > BR, patient unable and performed stand pivot transfer into w/c. Therapist assisted patient into bathroom for w/c>toilet>w/c>tub bench; patient supervision for transfers, but taking more than a reasonable amount of time. Patient completed UB/LB bathing in sit<>stand position from tub transfer bench in shower stall. Patient then transferred into w/c and completed grooming tasks seated at sink and UB/LB dressing in sit<>stand position from w/c. Therapist donned ace wraps > BLEs. Left patient seated in w/c donning socks with RN entering room for medication management.   Precautions:  Precautions Precautions: Fall Precaution Comments: watch gait belt placement due to incision (axillae preferable) Restrictions Weight Bearing Restrictions: No  See FIM for  current functional status  Therapy/Group: Individual Therapy  Eola Waldrep 08/07/2014, 8:43 AM

## 2014-08-07 NOTE — Progress Notes (Signed)
Occupational Therapy Session Note  Patient Details  Name: Toni [name] MRN: [id number] Date of Birth: August 13, 1946  Today's Date: 08/07/2014 OT Individual Time: 1300-1400 OT Individual Time Calculation (min): 60 min    Short Term Goals: Week 1:  OT Short Term Goal 1 (Week 1): Pt will perform bathing with Min A in order to increase I in self care.  OT Short Term Goal 2 (Week 1): Pt will perform 2/3 tasks of toileting in order to increase I in self care.  OT Short Term Goal 3 (Week 1): Pt will perform shower transfer with Min A in order to increase I in self care. OT Short Term Goal 4 (Week 1): Pt will engage in 20 minutes of functional task with no more than 1 rest break secondary to fatigue.  Skilled Therapeutic Interventions/Progress Updates:  Upon entering the room, pt seated in wheelchair with no c/o pain. However, pt reporting tingling in B LEs and fatigue this session. Pt propelled wheelchair from room to ADL apartment 150 feet independently with increased time and 2 rest breaks secondary to fatigue. Pt agreeing to stand with RW in kitchen to reaching out of base of support into cabinets to obtain items. Pt able to stand for ~ 5.5 minutes before requiring rest breaks. OT educated pt on energy conservation techniques for how to set up kitchen in order to create safer environment as well. Pt verbalized understanding but education to continue. OT recommended pt consider purchase of RW tray in the future in order to increase I and safety with carrying items from 1 location to another. OT assisted pt back to room via wheelchair secondary to toileting needs. Pt transferred stand pivot wheelchair <> toilet with steady assist and pt performed clothing management and hygiene with steady assist while standing with RW. Pt requesting to return to bed secondary to fatigue with steady assist with RW to bed and close supervision for sit >supine. Call bell and all needed items within reach prior to exiting  room.   Therapy Documentation Precautions:  Precautions Precautions: Fall Precaution Comments: watch gait belt placement due to incision (axillae preferable) Restrictions Weight Bearing Restrictions: No  See FIM for current functional status  Therapy/Group: Individual Therapy  Phineas Semen 08/07/2014, 4:45 PM

## 2014-08-07 NOTE — Progress Notes (Signed)
Patient states that she would prefer to put herself on CPAP.  Refilled water reservoir and told her to contact RT  if needed.

## 2014-08-07 NOTE — Progress Notes (Signed)
Physical Therapy Session Note  Patient Details  Name: Toni [name] MRN: [id number] Date of Birth: [birth year]  Today's Date: 08/07/2014  Short Term Goals: Week 1:  PT Short Term Goal 1 (Week 1): Pt will be supervision for bed mobility PT Short Term Goal 2 (Week 1): Pt will be min guard for transfers PT Short Term Goal 3 (Week 1): Pt will ambulate 22' with Min A PT Short Term Goal 4 (Week 1): Pt will propel w/c 100' with supervision  Session #1: 900-930 (30 min) Pt reports today is a bad day. She states that she has increased burning and pain in BLE and unable to straighten her knees like she could yesterday. RN already aware. Focused on toilet transfer out of bathroom and transfer back to bed with RW with overall close S and cues for posture during all transfers. Performed bed mobility mod I. Focused on supine LE stretching HEP as pt reporting feeling tightness in her legs. Performed piriformis stretch, trunk rotation, knee to chest, and hamstring stretch x 15 second hold x 3 reps each. Pt required A from therapist for RLE hamstring stretch.   Session #2:  1030-1100 (30 min) Reports pain is a little better but still bothering her in LEs. Focused on overall functional strengthening, endurance, and activity tolerance with w/c mobility on unit using BUE for propulsion and gait training with RW multiple bouts (15', x 20', x 50'). Focused on knee extension, upright posture, and relaxing in shoulders during gait trials. P  Therapy Documentation Precautions:  Precautions Precautions: Fall Precaution Comments: watch gait belt placement due to incision (axillae preferable) Restrictions Weight Bearing Restrictions: No  See FIM for current functional status  Therapy/Group: Individual Therapy  [name] [facility] 08/07/2014, 11:52 AM

## 2014-08-08 ENCOUNTER — Inpatient Hospital Stay (HOSPITAL_COMMUNITY): Payer: Medicare Other | Admitting: Occupational Therapy

## 2014-08-08 LAB — PROTIME-INR
INR: 2.57 — ABNORMAL HIGH (ref 0.00–1.49)
Prothrombin Time: 27.8 seconds — ABNORMAL HIGH (ref 11.6–15.2)

## 2014-08-08 MED ORDER — WARFARIN SODIUM 5 MG PO TABS
5.0000 mg | ORAL_TABLET | Freq: Every day | ORAL | Status: AC
  Administered 2014-08-08: 5 mg via ORAL
  Filled 2014-08-08: qty 1

## 2014-08-08 NOTE — Progress Notes (Signed)
[facility] for coumadin Indication: atrial fibrillation  Allergies  Allergen Reactions  . Oxycodone     Oral oxycodone causes vomiting  . Chlorhexidine Gluconate     Sores at site  . Zoloft [Sertraline] Hives, Swelling and Rash   Patient Measurements: Height: 5\' 3"  (160 cm) Weight: 263 lb 10.7 oz (119.6 kg) IBW/kg (Calculated) : 52.4  Vital Signs: Temp: 98.3 F (36.8 C) (12/19 0626) Temp Source: Oral (12/19 0626) BP: 121/60 mmHg (12/19 0626) Pulse Rate: 73 (12/19 0626)  Labs:  Recent Labs  08/06/14 0551 08/07/14 0611 08/08/14 0345  LABPROT 22.5* 24.3* 27.8*  INR 1.96* 2.17* 2.57*  CREATININE  --  0.76  --     Estimated Creatinine Clearance: 85.4 mL/min (by C-G formula based on Cr of 0.76).  Assessment: 68 yo female on chronic coumadin for hx of afib s/p baclofen pump replacement. INR continues to increase (2.57) today and at goal. No new cbc, no bleeding noted. She received 7.5mg  yesterday and had a nice response to her INR.  Will resume home regimen of 5mg  daily and see if she maintains an INR within the desired goal range.  PTA dose: Coumadin 5 mg po daily, INR therapeutic on this dose in October per clinic note..    Goal of Therapy:  INR 2-3 Monitor platelets by anticoagulation protocol: Yes   Plan:  - coumadin 5mg  daily  - INR in AM and then change to 3xweekly - follow for s/s bleeding  Rober Minion, PharmD., MS Clinical Pharmacist Pager:  [phone] Thank you for allowing pharmacy to be part of this patients care team. 08/08/2014 10:25 AM

## 2014-08-08 NOTE — Progress Notes (Signed)
RT Note: Pt places herself on and off CPAP per home use. H2O filled. I told her to call if she needs any further assistance

## 2014-08-08 NOTE — Progress Notes (Signed)
Occupational Therapy Session Note  Patient Details  Name: Toni [name] MRN: [id number] Date of Birth: 01/19/1946  Today's Date: 08/08/2014 OT Individual Time: 2423-5361 OT Individual Time Calculation (min): 30 min    Short Term Goals: Week 1:  OT Short Term Goal 1 (Week 1): Pt will perform bathing with Min A in order to increase I in self care.  OT Short Term Goal 2 (Week 1): Pt will perform 2/3 tasks of toileting in order to increase I in self care.  OT Short Term Goal 3 (Week 1): Pt will perform shower transfer with Min A in order to increase I in self care. OT Short Term Goal 4 (Week 1): Pt will engage in 20 minutes of functional task with no more than 1 rest break secondary to fatigue.  Skilled Therapeutic Interventions/Progress Updates:    Pt seen this session to address standing balance and activity tolerance with basic self care tasks. Pt's BLE wrapped with ACE wrap. Pt donned underwear and pants with reacher without A. Socks donned for pt as to not move ace wrapping on foot.  Pt was able to stand and use RW to ambulate to toilet and then back to w/c.  She did not need to toilet, but demonstrated her ability to manage her clothing. She requested to work on ambulation. Pt able to walk out of room and go 50 feet down hallway prior to a rest. She was able to self propel w/c back to her room.    Therapy Documentation Precautions:  Precautions Precautions: Fall Precaution Comments: watch gait belt placement due to incision (axillae preferable) Restrictions Weight Bearing Restrictions: No   Pain: Pain Assessment Pain Assessment: No/denies pain ADL: See FIM for current functional status  Therapy/Group: Individual Therapy  Takoda Janowiak 08/08/2014, 12:55 PM

## 2014-08-08 NOTE — Progress Notes (Addendum)
Toni [name] is a 68 y.o. female Jul 01, 1946 [id number]  Subjective: No new complaints. No new problems. Slept well. Feeling OK.  Objective: Vital signs in last 24 hours: Temp:  [97.3 F (36.3 C)-98.3 F (36.8 C)] 98.3 F (36.8 C) (12/19 0626) Pulse Rate:  [73-76] 73 (12/19 0626) Resp:  [18] 18 (12/19 0626) BP: (111-121)/(52-60) 121/60 mmHg (12/19 0626) SpO2:  [96 %-100 %] 96 % (12/19 0626) Weight change:  Last BM Date: 08/08/14  Intake/Output from previous day: 12/18 0701 - 12/19 0700 In: 1200 [P.O.:1200] Out: 1800 [Urine:1800] Last cbgs: CBG (last 3)  No results for input(s): GLUCAP in the last 72 hours.   Physical Exam General: No apparent distress   HEENT: not dry Lungs: Normal effort. Lungs clear to auscultation, no crackles or wheezes. Cardiovascular: Regular rate and rhythm, no edema Abdomen: S/NT/ND; BS(+) Musculoskeletal:  unchanged Neurological: No new neurological deficits Wounds: RLQ pump incision is dressed    Skin: clear  Aging changes  Faint rash on abdomen. RLQ incision is clean Mental state: Alert, oriented, cooperative    Lab Results: BMET    Component Value Date/Time   NA 143 08/07/2014 0611   NA 140 06/17/2013 1337   K 3.9 08/07/2014 0611   CL 102 08/07/2014 0611   CO2 32 08/07/2014 0611   GLUCOSE 84 08/07/2014 0611   GLUCOSE 79 06/17/2013 1337   BUN 17 08/07/2014 0611   BUN 13 06/17/2013 1337   CREATININE 0.76 08/07/2014 0611   CALCIUM 9.0 08/07/2014 0611   GFRNONAA 85* 08/07/2014 0611   GFRAA >90 08/07/2014 0611   CBC    Component Value Date/Time   WBC 7.7 08/03/2014 0439   WBC 6.0 06/17/2013 0137   RBC 4.80 08/03/2014 0439   RBC 4.94 06/17/2013 0137   HGB 14.5 08/03/2014 0439   HCT 43.7 08/03/2014 0439   PLT 197 08/03/2014 0439   MCV 91.0 08/03/2014 0439   MCH 30.2 08/03/2014 0439   MCH 30.4 06/17/2013 0137   MCHC 33.2 08/03/2014 0439   MCHC 34.1 06/17/2013 0137   RDW 12.6 08/03/2014 0439   RDW 13.4 06/17/2013 0137    LYMPHSABS 3.3 08/03/2014 0439   LYMPHSABS 2.2 06/17/2013 0137   MONOABS 0.8 08/03/2014 0439   EOSABS 0.3 08/03/2014 0439   EOSABS 0.2 06/17/2013 0137   BASOSABS 0.0 08/03/2014 0439   BASOSABS 0.0 06/17/2013 0137    Studies/Results: No results found.  Medications: I have reviewed the patient's current medications.  Assessment/Plan:   1. Functional deficits secondary to multiple sclerosis with pseudo-exacerbation following baclofen pump replacement -interrogated pump 12/14-- I reduced basal rate to 46mcg/hr---same overnight rate -pump alarm date is now 11/14/14 2. DVT Prophylaxis/Anticoagulation: Chronic Coumadin for atrial fibrillation. Monitor for any bleeding episodes 3. Pain Management: Neurontin 300 mg 3 times a day, hydrocodone as needed. Monitor with increased mobility 4. Mood/depression/anxiety: Wellbutrin 300 mg daily, Xanax 0.5 mg twice a day as needed. Provide emotional support - neuropsych to see for anxiety and cognitive assessment 5. Neuropsych: This patient is capable of making decisions on her own behalf. 6. Skin/Wound Care: Routine skin checks 7. Fluids/Electrolytes/Nutrition: Strict I and O's. Follow-up chemistries in am  -encourage adequate fluid intake  8. Hypertension. Lasix 40 mg twice a day tends to drop her bp -changed lasix to 40mg  am and 20mg  pm - Bystolic 20 mg daily---would like to continue to control HR.  -bilateral LE ACE wraps and elevation for edema 9. Hypothyroidism.Marland Kitchen Synthroid. 10. Hyperlipidemia. 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  Adjusedt bowel program.allow lactulose bid,  -Continue Flomax 0.4 mg daily.  -ucx neg---changed keflex back to the QHS dose 12. Rash---likely contact or heat--only on belly/inguinal area -improved -steroid cream prn -benadryl  prn -hypoallergenic sheets -advised her to use gauze or small rags in inguinal folds along with the microguard powder to help with yeast in skin folds  May need to d/c a pump dressing soon.    Length of stay, days: 8  Walker Kehr , MD 08/08/2014, 1:56 PM

## 2014-08-09 ENCOUNTER — Encounter (HOSPITAL_COMMUNITY): Payer: Medicare Other | Admitting: Occupational Therapy

## 2014-08-09 LAB — PROTIME-INR
INR: 2.71 — AB (ref 0.00–1.49)
Prothrombin Time: 29 seconds — ABNORMAL HIGH (ref 11.6–15.2)

## 2014-08-09 MED ORDER — WARFARIN SODIUM 2.5 MG PO TABS
2.5000 mg | ORAL_TABLET | Freq: Once | ORAL | Status: AC
  Administered 2014-08-09: 2.5 mg via ORAL
  Filled 2014-08-09: qty 1

## 2014-08-09 NOTE — Progress Notes (Signed)
RT Note:  Patient has CPAP set up at bedside.  Filled chamber with [name].  Patient states she is able to place on/off as needed.  Encouraged to call for Respiratory if  Assistance needed.

## 2014-08-09 NOTE — Progress Notes (Signed)
Occupational Therapy Session Notes  Patient Details  Name: Toni [name] MRN: [id number] Date of Birth: 03-03-1946  Today's Date: 08/09/2014  Short Term Goals: Week 1:  OT Short Term Goal 1 (Week 1): Pt will perform bathing with Min A in order to increase I in self care.  OT Short Term Goal 2 (Week 1): Pt will perform 2/3 tasks of toileting in order to increase I in self care.  OT Short Term Goal 3 (Week 1): Pt will perform shower transfer with Min A in order to increase I in self care. OT Short Term Goal 4 (Week 1): Pt will engage in 20 minutes of functional task with no more than 1 rest break secondary to fatigue.  Skilled Therapeutic Interventions/Progress Updates:   Session #1 1100-1200 - 60 Minutes Individual Therapy No complaints of pain, but of a burning sensation in left UE Patient received supine in bed. Patient engaged in bed mobility and stood with RW and ambulated into BR for toilet transfer, then shower stall transfer; all with supervision. Patient completed UB/LB bathing at mod I level, then transferred > w/c(instead of ambulating out of shower, secondary to fatigue). Patient gathered all necessary items and completed grooming tasks seated at sink and UB/LB dressing in sit<>stand position from w/c. Patient exhibits safe behaviors and is able to complete tasks at an overall supervision>mod I level. Patient continues to present with BLE weakness and fatigue which limits her ability to perform all mobility through ambulation. At end of session, left patient seated in w/c with all needs within reach.   Session #2 1300-1400 - 60 Minutes Individual Therapy Patient with no complaints of pain Patient received seated in w/c with husband present at bedside. Patient performed w/c > EOB transfer with min verbal cues from therapist for safest and most effective transfer. Patient laid in supine position for therapist to educate patient's husband on compression wrapping using ace wraps >  BLEs. Therapist wrapped RLE and talked patient's husband through this. Patient's husband then wrapped LLE with minimal verbal cues from therapist. Patient also able to direct care prn for compression wrapping. Patient then with need to use restroom. Patient engaged in bed mobility, stood with RW and ambulated into bathroom. Patient completed toilet transfer with supervision, toileting with mod I , then ambulated back to w/c. Therapist administered built up foam for utensils & toothbrush and theraputty. Therapist educated patient on theraputty exercises. Patient nor husband with any further family education questions at this time. At end of session, left patient seated in w/c with all needs within reach and husband present at bedside.   Precautions:  Precautions Precautions: Fall Precaution Comments: watch gait belt placement due to incision (axillae preferable) Restrictions Weight Bearing Restrictions: No  See FIM for current functional status  Joelyn Lover 08/09/2014, 7:59 AM

## 2014-08-09 NOTE — Progress Notes (Signed)
[facility] for coumadin Indication: atrial fibrillation  Allergies  Allergen Reactions  . Oxycodone     Oral oxycodone causes vomiting  . Chlorhexidine Gluconate     Sores at site  . Zoloft [Sertraline] Hives, Swelling and Rash   Patient Measurements: Height: 5\' 3"  (160 cm) Weight: 263 lb 10.7 oz (119.6 kg) IBW/kg (Calculated) : 52.4  Vital Signs: Temp: 98.4 F (36.9 C) (12/20 0422) Temp Source: Oral (12/20 0422) BP: 115/54 mmHg (12/20 0422) Pulse Rate: 65 (12/20 0422)  Labs:  Recent Labs  08/07/14 0611 08/08/14 0345 08/09/14 0419  LABPROT 24.3* 27.8* 29.0*  INR 2.17* 2.57* 2.71*  CREATININE 0.76  --   --     Estimated Creatinine Clearance: 85.4 mL/min (by C-G formula based on Cr of 0.76).  Assessment: 68 yo female on chronic coumadin for hx of afib s/p baclofen pump replacement. INR continues to increase (2.7) today and at goal. No new cbc, no bleeding noted. She is back on her home regimen of 5mg  daily and may still be showing effect of the 7.5mg  dose she had on 12/17.  Will give lower dose tonight and then try her home regimen to see if we can determine her routine maintenance dose.  PTA dose: Coumadin 5 mg po daily, INR therapeutic on this dose in October per clinic note..    Goal of Therapy:  INR 2-3 Monitor platelets by anticoagulation protocol: Yes   Plan:  - Warfarin 2.5mg  x 1 the Warfarin 5mg  daily  - INR in AM and then change to 3xweekly - follow for s/s bleeding  Rober Minion, PharmD., MS Clinical Pharmacist Pager:  [phone] Thank you for allowing pharmacy to be part of this patients care team. 08/09/2014 9:23 AM

## 2014-08-09 NOTE — Progress Notes (Signed)
Toni [name] is a 68 y.o. female [birth year] [id number]  Subjective: No new complaints. No new problems. Slept well. Feeling OK.  Objective: Vital signs in last 24 hours: Temp:  [98.4 F (36.9 C)] 98.4 F (36.9 C) (12/20 0422) Pulse Rate:  [65-78] 65 (12/20 0422) Resp:  [18] 18 (12/20 0422) BP: (94-115)/(43-54) 115/54 mmHg (12/20 0422) SpO2:  [97 %-100 %] 97 % (12/20 0422) Weight change:  Last BM Date: 08/08/14  Intake/Output from previous day: 12/19 0701 - 12/20 0700 In: 960 [P.O.:960] Out: 1550 [Urine:1550] Last cbgs: CBG (last 3)  No results for input(s): GLUCAP in the last 72 hours.   Physical Exam General: No apparent distress. Obese   HEENT: not dry Lungs: Normal effort. Lungs clear to auscultation, no crackles or wheezes. Cardiovascular: Regular rate and rhythm, no edema Abdomen: S/NT/ND; BS(+) Musculoskeletal:  unchanged Neurological: No new neurological deficits Wounds: RLQ pump incision is dressed    Skin: Faint rash on abdomen. RLQ incision. Aging changes Mental state: Alert, oriented, cooperative    Lab Results: BMET    Component Value Date/Time   NA 143 08/07/2014 0611   NA 140 06/17/2013 1337   K 3.9 08/07/2014 0611   CL 102 08/07/2014 0611   CO2 32 08/07/2014 0611   GLUCOSE 84 08/07/2014 0611   GLUCOSE 79 06/17/2013 1337   BUN 17 08/07/2014 0611   BUN 13 06/17/2013 1337   CREATININE 0.76 08/07/2014 0611   CALCIUM 9.0 08/07/2014 0611   GFRNONAA 85* 08/07/2014 0611   GFRAA >90 08/07/2014 0611   CBC    Component Value Date/Time   WBC 7.7 08/03/2014 0439   WBC 6.0 06/17/2013 0137   RBC 4.80 08/03/2014 0439   RBC 4.94 06/17/2013 0137   HGB 14.5 08/03/2014 0439   HCT 43.7 08/03/2014 0439   PLT 197 08/03/2014 0439   MCV 91.0 08/03/2014 0439   MCH 30.2 08/03/2014 0439   MCH 30.4 06/17/2013 0137   MCHC 33.2 08/03/2014 0439   MCHC 34.1 06/17/2013 0137   RDW 12.6 08/03/2014 0439   RDW 13.4 06/17/2013 0137   LYMPHSABS 3.3 08/03/2014 0439    LYMPHSABS 2.2 06/17/2013 0137   MONOABS 0.8 08/03/2014 0439   EOSABS 0.3 08/03/2014 0439   EOSABS 0.2 06/17/2013 0137   BASOSABS 0.0 08/03/2014 0439   BASOSABS 0.0 06/17/2013 0137    Studies/Results: No results found.  Medications: I have reviewed the patient's current medications.  Assessment/Plan:   1. Functional deficits secondary to multiple sclerosis with pseudo-exacerbation following baclofen pump replacement -interrogated pump 12/14-- I reduced basal rate to 56mcg/hr---same overnight rate -pump alarm date is now 11/14/14 2. DVT Prophylaxis/Anticoagulation: Chronic Coumadin for atrial fibrillation. Monitor for any bleeding episodes 3. Pain Management: Neurontin 300 mg 3 times a day, hydrocodone as needed. Monitor with increased mobility 4. Mood/depression/anxiety: Wellbutrin 300 mg daily, Xanax 0.5 mg twice a day as needed. Provide emotional support - neuropsych to see for anxiety and cognitive assessment 5. Neuropsych: This patient is capable of making decisions on her own behalf. 6. Skin/Wound Care: Routine skin checks 7. Fluids/Electrolytes/Nutrition: Strict I and O's. Follow-up chemistries in am  -encourage adequate fluid intake  8. Hypertension. Lasix 40 mg twice a day tends to drop her bp -changed lasix to 40mg  am and 20mg  pm - Bystolic 20 mg daily---would like to continue to control HR.  -bilateral LE ACE wraps and elevation for edema 9. Hypothyroidism.Marland Kitchen Synthroid. 10. Hyperlipidemia. 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 Adjusedt bowel program.allow lactulose bid,  -Continue  Flomax 0.4 mg daily.  -ucx neg---changed keflex back to the QHS dose 12. Rash---likely contact or heat--only on belly/inguinal area -improved -steroid cream prn -benadryl prn -hypoallergenic sheets -advised  her to use gauze or small rags in inguinal folds along with the microguard powder to help with yeast in skin folds  May need to d/c a pump dressing - per Dr Vertell Limber.    Length of stay, days: 9  Walker Kehr , MD 08/09/2014, 8:47 AM

## 2014-08-09 NOTE — Progress Notes (Signed)
Lactulose and Xanax given per patient's request at 2219. Edema to BLE's, wearing ace wraps during the day. Patrici Ranks A

## 2014-08-10 ENCOUNTER — Ambulatory Visit: Payer: Medicare Other | Admitting: Pharmacist Clinician (PhC)/ Clinical Pharmacy Specialist

## 2014-08-10 ENCOUNTER — Inpatient Hospital Stay (HOSPITAL_COMMUNITY): Payer: Medicare Other

## 2014-08-10 ENCOUNTER — Encounter (HOSPITAL_COMMUNITY): Payer: Medicare Other | Admitting: Occupational Therapy

## 2014-08-10 LAB — CBC
HCT: 43.2 % (ref 36.0–46.0)
Hemoglobin: 14.2 g/dL (ref 12.0–15.0)
MCH: 30.3 pg (ref 26.0–34.0)
MCHC: 32.9 g/dL (ref 30.0–36.0)
MCV: 92.1 fL (ref 78.0–100.0)
Platelets: 211 10*3/uL (ref 150–400)
RBC: 4.69 MIL/uL (ref 3.87–5.11)
RDW: 12.8 % (ref 11.5–15.5)
WBC: 6.4 10*3/uL (ref 4.0–10.5)

## 2014-08-10 LAB — PROTIME-INR
INR: 2.14 — ABNORMAL HIGH (ref 0.00–1.49)
Prothrombin Time: 24.1 seconds — ABNORMAL HIGH (ref 11.6–15.2)

## 2014-08-10 MED ORDER — WARFARIN SODIUM 5 MG PO TABS
5.0000 mg | ORAL_TABLET | Freq: Once | ORAL | Status: AC
  Administered 2014-08-10: 5 mg via ORAL
  Filled 2014-08-10: qty 1

## 2014-08-10 NOTE — Progress Notes (Signed)
68 y.o. right handed female with history of atrial fibrillation on chronic Coumadin, multiple sclerosis diagnosed in 1989 followed by neurology services Dr. Brett Fairy. Patient lives with her husband used a walker prior to admission and was independent with assistive device. Presented 07/29/2014 with increased spasticity secondary to multiple sclerosis. Patient has a baclofen pump in place. Underwent baclofen pump replacement 07/29/2014 per Dr. Vertell Limber. Hospital course pain management as well as titration and baclofen to aid in her spasticity.  Subjective/Complaints: I used to take flomax at bedtime when I was at home Review of Systems - Negative except mild weakness in legs (improving) Objective:     Vital Signs: Blood pressure 118/56, pulse 79, temperature 98.1 F (36.7 C), temperature source Oral, resp. rate 18, height 5\' 3"  (1.6 m), weight 119.6 kg (263 lb 10.7 oz), SpO2 97 %. No results found. Results for orders placed or performed during the hospital encounter of 07/31/14 (from the past 72 hour(s))  Protime-INR     Status: Abnormal   Collection Time: 08/08/14  3:45 AM  Result Value Ref Range   Prothrombin Time 27.8 (H) 11.6 - 15.2 seconds   INR 2.57 (H) 0.00 - 1.49  Protime-INR     Status: Abnormal   Collection Time: 08/09/14  4:19 AM  Result Value Ref Range   Prothrombin Time 29.0 (H) 11.6 - 15.2 seconds   INR 2.71 (H) 0.00 - 1.49  Protime-INR     Status: Abnormal   Collection Time: 08/10/14  7:40 AM  Result Value Ref Range   Prothrombin Time 24.1 (H) 11.6 - 15.2 seconds   INR 2.14 (H) 0.00 - 1.49  CBC     Status: None   Collection Time: 08/10/14  7:40 AM  Result Value Ref Range   WBC 6.4 4.0 - 10.5 K/uL   RBC 4.69 3.87 - 5.11 MIL/uL   Hemoglobin 14.2 12.0 - 15.0 g/dL   HCT 43.2 36.0 - 46.0 %   MCV 92.1 78.0 - 100.0 fL   MCH 30.3 26.0 - 34.0 pg   MCHC 32.9 30.0 - 36.0 g/dL   RDW 12.8 11.5 - 15.5 %   Platelets 211 150 - 400 K/uL      Constitutional: She is oriented to  person, place, and time.  HENT:  Head: Normocephalic.  Eyes: EOM are normal.  Neck: Normal range of motion. Neck supple. No thyromegaly present.  Cardiovascular: Normal rate. An irregularly irregular rhythm present. HR in 70's. 1+ edema bilateral LE Respiratory: Effort normal and breath sounds normal. No respiratory distress.  GI: Soft. Bowel sounds are normal. She exhibits no distension. There is no tenderness.  Neurological: She is alert and oriented to person, place, and time.  Reflex Scores:  Patellar reflexes are 0 on the right side and 0 on the left side.  Achilles reflexes are 0 on the right side and 0 on the left side. Follows all commands reduced tone persistent in bilateral lower limbs  Motor strength is 5/5 bilateral deltoid, biceps, triceps, grip, 3 minus right hip flexor and knee extensor ankle dorsiflexor and EHL, 4/5 and left hip flexor and knee extensor and ankle dorsiflexor and EHL Sensory intact to light touch in both upper and lower limbs with paresthesias noted in the upper extremities. Cranial nerves II through XII intact   Skin: Skin is warm and dry. Pump/surgical site intact. Maculopapular rash resolving on abdomen and flanks  Assessment/Plan: 1. Functional deficits secondary to multiple sclerosis with pseudo-exacerbation following baclofen pump replacement which require 3+  hours per day of interdisciplinary therapy in a comprehensive inpatient rehab setting. Physiatrist is providing close team supervision and 24 hour management of active medical problems listed below. Physiatrist and rehab team continue to assess barriers to discharge/monitor patient progress toward functional and medical goals. FIM: FIM - Bathing Bathing Steps Patient Completed: Chest, Right Arm, Left Arm, Abdomen, Front perineal area, Buttocks, Right upper leg, Left upper leg, Right lower leg (including foot), Left lower leg (including foot) Bathing: 6: Assistive device (Comment) (using  transfer bench)  FIM - Upper Body Dressing/Undressing Upper body dressing/undressing steps patient completed: Thread/unthread right sleeve of pullover shirt/dresss, Thread/unthread left sleeve of pullover shirt/dress, Put head through opening of pull over shirt/dress, Pull shirt over trunk, Thread/unthread right bra strap, Thread/unthread left bra strap, Hook/unhook bra Upper body dressing/undressing: 7: Complete Independence: No helper FIM - Lower Body Dressing/Undressing Lower body dressing/undressing steps patient completed: Thread/unthread right underwear leg, Thread/unthread left underwear leg, Thread/unthread right pants leg, Thread/unthread left pants leg, Pull underwear up/down, Pull pants up/down, Don/Doff left sock, Don/Doff right sock Lower body dressing/undressing: 6: Assistive device (Comment)  FIM - Toileting Toileting steps completed by patient: Adjust clothing prior to toileting, Performs perineal hygiene, Adjust clothing after toileting Toileting Assistive Devices: Grab bar or rail for support Toileting: 6: Assistive device: No helper  FIM - Radio producer Devices: Grab bars, Elevated toilet seat, Insurance account manager Transfers: 6-Assistive device: No helper  FIM - Control and instrumentation engineer Devices: Copy: 6: Assistive device: no helper  FIM - Locomotion: Wheelchair Distance: 75'  Locomotion: Wheelchair: 5: Travels 150 ft or more: maneuvers on rugs and over door sills with supervision, cueing or coaxing FIM - Locomotion: Ambulation Locomotion: Ambulation Assistive Devices: Administrator Ambulation/Gait Assistance: 4: Min guard, 5: Supervision Locomotion: Ambulation: 2: Travels 50 - 149 ft with minimal assistance (Pt.>75%)  Comprehension Comprehension Mode: Auditory Comprehension: 7-Follows complex conversation/direction: With no assist  Expression Expression Mode: Verbal Expression: 7-Expresses  complex ideas: With no assist  Social Interaction Social Interaction: 7-Interacts appropriately with others - No medications needed.  Problem Solving Problem Solving: 7-Solves complex problems: Recognizes & self-corrects  Memory Memory: 7-Complete Independence: No helper  Medical Problem List and Plan: 1. Functional deficits secondary to multiple sclerosis with pseudo-exacerbation following baclofen pump replacement  -interrogated pump 12/14-- I reduced basal rate to 77mcg/hr---same overnight rate  -pump alarm date is now 11/14/14 2. DVT Prophylaxis/Anticoagulation: Chronic Coumadin for atrial fibrillation. Monitor for any bleeding episodes 3. Pain Management: Neurontin 300 mg 3 times a day, hydrocodone as needed. Monitor with increased mobility 4. Mood/depression/anxiety: Wellbutrin 300 mg daily, Xanax 0.5 mg twice a day as needed. Provide emotional support  -  neuropsych to see for anxiety and cognitive assessment 5. Neuropsych: This patient is capable of making decisions on her own behalf. 6. Skin/Wound Care: Routine skin checks 7. Fluids/Electrolytes/Nutrition: Strict I and O's. Follow-up chemistries in am   -encourage adequate fluid intake  8. Hypertension. With orthostatic drops, flomax changed to qhs   Lasix 40 mg twice a day tends to drop her bp  -changed lasix to 40mg  am and 20mg  pm  - Bystolic 20 mg daily---would like to continue to control HR.    -bilateral LE ACE wraps and elevation for edema 9. Hypothyroidism.Marland Kitchen Synthroid. 10. Hyperlipidemia. 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 Adjusedt bowel program.allow lactulose bid,    -Continue Flomax 0.4 mg daily.    -ucx neg---changed keflex back to the QHS dose 12. Rash---likely contact or  heat--only on belly/inguinal area  -improved  -steroid cream prn  -benadryl prn  -hypoallergenic sheets  -advised her to use gauze or small rags in inguinal folds along with the microguard powder to help with yeast in skin  folds LOS (Days) 10    A FACE TO FACE EVALUATION WAS PERFORMED  Johnryan Sao E 08/10/2014, 9:01 AM

## 2014-08-10 NOTE — Progress Notes (Signed)
Physical Therapy Session Note  Patient Details  Name: Toni [name] MRN: [id number] Date of Birth: 19-Feb-1946  Today's Date: 08/10/2014 PT Individual Time: 1300-1400 PT Individual Time Calculation (min): 60 min   Short Term Goals: Week 1:  PT Short Term Goal 1 (Week 1): Pt will be supervision for bed mobility PT Short Term Goal 1 - Progress (Week 1): Met PT Short Term Goal 2 (Week 1): Pt will be min guard for transfers PT Short Term Goal 2 - Progress (Week 1): Met PT Short Term Goal 3 (Week 1): Pt will ambulate 68' with Min A PT Short Term Goal 3 - Progress (Week 1): Met PT Short Term Goal 4 (Week 1): Pt will propel w/c 100' with supervision PT Short Term Goal 4 - Progress (Week 1): Met Week 2:  PT Short Term Goal 1 (Week 2): = LTGs  Skilled Therapeutic Interventions/Progress Updates:   Session focused on stair negotiation, functional transfers, reviewing LE HEP, and Nustep for general strengthening and endurance. Stair negotiation with min to mod A using bilateral rails up/down 1 step (4") and then up/down 1 (6") step.Pt reports she has not been able to go up a step in over a year. Pt performed LE supine therex x 10 reps each x 2 sets of heel slides, hip abduction, SLR, SAQ, and bridging. Nustep x 10 min (3 min level 4, 3 min level 5, 3 min level 6 and 1 min level 7). Pt performed all basic transfers with RW and short distance gait mod I level. Pt made mod I in room with RW end of session.   Therapy Documentation Precautions:  Precautions Precautions: Fall Precaution Comments: watch gait belt placement due to incision (axillae preferable) Required Braces or Orthoses:  (pt uses ACE wrapts on BLE for compression secondary to edema) Restrictions Weight Bearing Restrictions: No  Pain:  No complaints.  See FIM for current functional status  Therapy/Group: Individual Therapy  [name] [facility] 08/10/2014, 3:41 PM

## 2014-08-10 NOTE — Progress Notes (Signed)
Patient has CPAP set up at bedside. She said she places herself on/off and does not need assistance.

## 2014-08-10 NOTE — Progress Notes (Signed)
Physical Therapy Discharge Summary  Patient Details  Name: Toni [name] MRN: [id number] Date of Birth: Feb 24, 1946  Today's Date: 08/10/2014  Patient has met 7 of 7 long term goals due to improved activity tolerance, improved balance, improved postural control, increased strength, increased range of motion, decreased pain and ability to compensate for deficits.  Patient to discharge at an ambulatory level Modified Independent with RW for household distances and using w/c for longer distances (mod I on level surfaces). Pt's husband completed family education with OT but specifically PT due to pt at mod I level for mobility. Pt is able to direct care if needed.   Reasons goals not met: n/a - all goals met at this time.   Recommendation:  Patient will benefit from ongoing skilled PT services in home health setting to continue to advance safe functional mobility, address ongoing impairments in gait, ROM, strength, balance, endurance/activity tolerance, functional mobility, and minimize fall risk.  Equipment: 20x18 w/c with basic cushion and legrests  Reasons for discharge: treatment goals met and discharge from hospital  Patient/family agrees with progress made and goals achieved: Yes  PT Discharge Precautions/Restrictions Precautions Precautions: Fall Required Braces or Orthoses:  (pt uses ACE wrapts on BLE for compression secondary to edema) Restrictions Weight Bearing Restrictions: No Vision/Perception  Perception Perception: Within Functional Limits Praxis Praxis: Intact  Cognition Overall Cognitive Status: Within Functional Limits for tasks assessed Orientation Level: Oriented X4 Memory: Appears intact Awareness: Appears intact Problem Solving: Appears intact Safety/Judgment: Appears intact Sensation Sensation Light Touch: Impaired Detail Light Touch Impaired Details: Impaired RLE;Impaired LLE;Impaired RUE;Impaired LUE (reports intermittent tingling LUE > RUE; decr LT R  >L) Additional Comments: Pt reports B carpal tunnel syndrome with  L UE being worse than R, pt also reports numbness left finger tips and tingling in right fingers Coordination Gross Motor Movements are Fluid and Coordinated: Yes Fine Motor Movements are Fluid and Coordinated: Yes (patient will benefit from continued BUE HEP) Motor  Motor Motor: Abnormal postural alignment and control  Locomotion  Ambulation Ambulation/Gait Assistance: 6: Modified independent (Device/Increase time)  Trunk/Postural Assessment  Cervical Assessment Cervical Assessment: Exceptions to [facility] (forward head) Thoracic Assessment Thoracic Assessment: Exceptions to [facility] (kyphotic posture) Lumbar Assessment Lumbar Assessment: Exceptions to [facility] (sits in posterior tilt - body habitus hard to fully assess)  Balance Balance Balance Assessed: Yes Static Sitting Balance Static Sitting - Level of Assistance: 6: Modified independent (Device/Increase time) Dynamic Sitting Balance Dynamic Sitting - Level of Assistance: 6: Modified independent (Device/Increase time) Static Standing Balance Static Standing - Level of Assistance: 6: Modified independent (Device/Increase time) (with RW) Dynamic Standing Balance Dynamic Standing - Level of Assistance: 6: Modified independent (Device/Increase time) (with RW) Extremity Assessment  RUE Assessment RUE Assessment: Within Functional Limits RUE AROM (degrees) Overall AROM Right Upper Extremity: Within functional limits for tasks performed RUE Strength RUE Overall Strength: Within Functional Limits for tasks performed LUE Assessment LUE Assessment: Within Functional Limits LUE AROM (degrees) Overall AROM Left Upper Extremity: Within functional limits for tasks assessed LUE Strength LUE Overall Strength: Within Functional Limits for tasks assessed RLE AROM (degrees) RLE Overall AROM Comments: lacking extension functionally RLE Strength RLE Overall Strength Comments: grossly  3+/5 LLE AROM (degrees) LLE Overall AROM Comments: lacking knee extension functionally LLE Strength LLE Overall Strength Comments: grossly 4/5  See FIM for current functional status  [name] [facility] 08/10/2014, 3:43 PM

## 2014-08-10 NOTE — Progress Notes (Signed)
[place] for coumadin Indication: atrial fibrillation  Allergies  Allergen Reactions  . Oxycodone     Oral oxycodone causes vomiting  . Chlorhexidine Gluconate     Sores at site  . Zoloft [Sertraline] Hives, Swelling and Rash   Patient Measurements: Height: 5\' 3"  (160 cm) Weight: 263 lb 10.7 oz (119.6 kg) IBW/kg (Calculated) : 52.4  Vital Signs: Temp: 98.1 F (36.7 C) (12/21 0621) Temp Source: Oral (12/21 0621) BP: 118/56 mmHg (12/21 0621) Pulse Rate: 79 (12/21 0621)  Labs:  Recent Labs  08/08/14 0345 08/09/14 0419 08/10/14 0740  HGB  --   --  14.2  HCT  --   --  43.2  PLT  --   --  211  LABPROT 27.8* 29.0* 24.1*  INR 2.57* 2.71* 2.14*    Estimated Creatinine Clearance: 85.4 mL/min (by C-G formula based on Cr of 0.76).  Assessment: 68 yo female on chronic coumadin for hx of afib s/p baclofen pump replacement. INR today is 2.14 which remains therapeutic, although decreased from 2.71 yesterday.   No CBC stable with Hgb 14.2 and pltc 211K, no bleeding noted. She is back on her home regimen of 5mg  daily and may still be showing effect of the 7.5mg  dose she had on 12/17.  Lower dose of 2.5 mg given last night.   PTA dose: Coumadin 5 mg po daily, INR therapeutic on this dose in October per clinic note..    Goal of Therapy:  INR 2-3 Monitor platelets by anticoagulation protocol: Yes   Plan:  - Warfarin 5mg  daily  - INR in AM - follow for s/s bleeding  Nicole Cella, RPh Clinical Pharmacist Pager: [phone] Clinical Pharmacist Pager:  [phone] Thank you for allowing pharmacy to be part of this patients care team. 08/10/2014 1:30 PM

## 2014-08-10 NOTE — Progress Notes (Signed)
Occupational Therapy Session Note &  Discharge Summary  Patient Details  Name: Toni [name] MRN: [id number] Date of Birth: 1946/03/08  Today's Date: 08/10/2014   SESSION NOTE  OT Individual Time: 2440-1027 OT Individual Time Calculation (min): 60 min  Patient received supine in bed stating she was "sleepy". Patient with no complaints of pain, but with complaints of discomfort in left hand. Patient engaged in bed mobility without use of bedrail at independent level. Patient then stood with RW at [facility] I level and ambulated into BR for toilet transfer, shower stall transfer, and ADL at shower level (overall mod I level). Patient ambulated out of shower>w/c at mod I level and completed UB/LB dressing. Patient states she's having a good day today and relates this to getting a good night of sleep. Therapist ace wrapped BLEs for compression and left patient seated in w/c with all needs within reach.   --------------------------------------------------------------------------------------------------------------------------------------  DISCHARGE SUMMARY  Patient has met 9 of 9 long term goals due to improved activity tolerance, improved balance, postural control, ability to compensate for deficits, functional use of  RIGHT upper and LEFT upper extremity, improved attention, improved awareness and improved coordination.  Patient to discharge at overall Modified Independent level.  Patient's care partner is independent to provide the necessary supervision prn assistance at discharge.    Reasons goals not met: n/a, all goals met at this time.   Recommendation: No additional occupational therapy recommended at this time  Equipment: No equipment provided, patient has needed equipment for use at home  Reasons for discharge: treatment goals met and discharge from hospital  Patient/family agrees with progress made and goals achieved: Yes  Precautions/Restrictions  Precautions Precautions:  Fall Required Braces or Orthoses:  (pt uses ace wraps on BLEs for compression secondary to edema) Restrictions Weight Bearing Restrictions: No   Vital Signs Therapy Vitals Temp: 98.1 F (36.7 C) Temp Source: Oral Pulse Rate: 79 Resp: 18 BP: (!) 118/56 mmHg Patient Position (if appropriate): Lying Oxygen Therapy SpO2: 97 % O2 Device: Not Delivered   ADL - See FIM  Vision/Perception  Vision- History Baseline Vision/History: Wears glasses Wears Glasses: Reading only Patient Visual Report: No change from baseline Vision- Assessment Vision Assessment?: No apparent visual deficits   Cognition Overall Cognitive Status: Within Functional Limits for tasks assessed Orientation Level: Oriented X4 Memory: Appears intact Awareness: Appears intact Problem Solving: Appears intact Safety/Judgment: Appears intact   Sensation Sensation Additional Comments: Pt reports B carpal tunnel syndrome with  L UE being worse than R, pt also reports numbness left finger tips and tingling in right fingers Coordination Gross Motor Movements are Fluid and Coordinated: Yes (patient will benefit from continued BUE HEP) Fine Motor Movements are Fluid and Coordinated: Yes (patient will benefit from continued BUE HEP)   Motor  Motor Motor: Abnormal postural alignment and control   Trunk/Postural Assessment  Cervical Assessment Cervical Assessment: Exceptions to [facility] (forward head) Thoracic Assessment Thoracic Assessment: Exceptions to [facility] (kyphotic posture) Lumbar Assessment Lumbar Assessment: Exceptions to [facility] (sits in posterior tilt - body habitus hard to fully assess)   Balance Balance Balance Assessed: Yes Static Sitting Balance Static Sitting - Level of Assistance: 6: Modified independent (Device/Increase time) Dynamic Sitting Balance Dynamic Sitting - Level of Assistance: 6: Modified independent (Device/Increase time) Static Standing Balance Static Standing - Level of Assistance: 6:  Modified independent (Device/Increase time) (with RW) Dynamic Standing Balance Dynamic Standing - Level of Assistance: 6: Modified independent (Device/Increase time) (with RW)   Extremity/Trunk Assessment RUE  Assessment RUE Assessment: Within Functional Limits RUE AROM (degrees) Overall AROM Right Upper Extremity: Within functional limits for tasks performed RUE Strength RUE Overall Strength: Within Functional Limits for tasks performed LUE Assessment LUE Assessment: Within Functional Limits LUE AROM (degrees) Overall AROM Left Upper Extremity: Within functional limits for tasks assessed LUE Strength LUE Overall Strength: Within Functional Limits for tasks assessed  See FIM for current functional status  Gunnar Hereford 08/10/2014, 10:02 AM

## 2014-08-10 NOTE — Progress Notes (Signed)
Physical Therapy Session Note  Patient Details  Name: Toni [name] MRN: [id number] Date of Birth: July 11, 1946  Today's Date: 08/10/2014 PT Individual Time: 0900-1000 PT Individual Time Calculation (min): 60 min   Short Term Goals: Week 1:  PT Short Term Goal 1 (Week 1): Pt will be supervision for bed mobility PT Short Term Goal 1 - Progress (Week 1): Met PT Short Term Goal 2 (Week 1): Pt will be min guard for transfers PT Short Term Goal 2 - Progress (Week 1): Met PT Short Term Goal 3 (Week 1): Pt will ambulate 50' with Min A PT Short Term Goal 3 - Progress (Week 1): Met PT Short Term Goal 4 (Week 1): Pt will propel w/c 100' with supervision PT Short Term Goal 4 - Progress (Week 1): Met  Skilled Therapeutic Interventions/Progress Updates:   Session focused on functional mobility and transfers including gait, w/c propulsion and parts management mod I, bathroom transfers, bed mobility and gait (up to 90' feet today) in controlled and household environment in ADL apartment, and simulated car transfers (S without use of RW but using armrests and handle in car for support). Pt using RW for mobility and transfers at overall mod I level today and able to verbalize appropriate times for ambulation vs use of w/c depending on fatigue level. Pt reports feeling prepared for d/c tomorrow with plan to work on finalizing HEP this afternoon in the session as well as pt wanting to attempt 1 step again today before leaving. Pt reports her husband measured the ramp and the w/c ordered (20x18) will fit.   Therapy Documentation Precautions:  Precautions Precautions: Fall Precaution Comments: watch gait belt placement due to incision (axillae preferable) Required Braces or Orthoses:  (pt uses ACE wrapts on BLE for compression secondary to edema) Restrictions Weight Bearing Restrictions: No  Pain:  Reports tingling and numbness in L hand is worse this morning. RN aware.   See FIM for current functional  status  Therapy/Group: Individual Therapy  [name] [facility] 08/10/2014, 11:55 AM

## 2014-08-11 DIAGNOSIS — G8389 Other specified paralytic syndromes: Secondary | ICD-10-CM

## 2014-08-11 LAB — PROTIME-INR
INR: 2.04 — ABNORMAL HIGH (ref 0.00–1.49)
Prothrombin Time: 23.2 seconds — ABNORMAL HIGH (ref 11.6–15.2)

## 2014-08-11 MED ORDER — FUROSEMIDE 40 MG PO TABS: ORAL_TABLET | ORAL | Status: DC

## 2014-08-11 MED ORDER — GABAPENTIN 300 MG PO CAPS: 300.0000 mg | ORAL_CAPSULE | Freq: Three times a day (TID) | ORAL | Status: DC

## 2014-08-11 MED ORDER — WARFARIN SODIUM 5 MG PO TABS
5.0000 mg | ORAL_TABLET | Freq: Once | ORAL | Status: DC
  Filled 2014-08-11: qty 1

## 2014-08-11 NOTE — Progress Notes (Signed)
Pt. Got d/c orders and instructions,pt. Ready to go home with her husband.

## 2014-08-11 NOTE — Discharge Summary (Signed)
Discharge summary job # [id number]

## 2014-08-11 NOTE — Progress Notes (Addendum)
Social Work  Discharge Note  The overall goal for the admission was met for:   Discharge location: Yes - home with husband and son to provide any needed assistance  Length of Stay: Yes - 11 days  Discharge activity level: Yes - mod i to supervision  Home/community participation: Yes  Services provided included: MD, RD, PT, OT, RN, TR, Pharmacy and [facility]: Medicare and Private Insurance: [employer]  Follow-up services arranged: Home Health: PT and RN via [place], DME: 20x18 lightweight w/c, cushion via [facility] and Patient/Family has no preference for HH/DME agencies  Comments (or additional information):  Patient/Family verbalized understanding of follow-up arrangements: Yes  Individual responsible for coordination of the follow-up plan: patient  Confirmed correct DME delivered: Quayshaun Hubbert 08/11/2014    Findley Vi

## 2014-08-11 NOTE — Progress Notes (Signed)
68 y.o. right handed female with history of atrial fibrillation on chronic Coumadin, multiple sclerosis diagnosed in 1989 followed by neurology services Dr. Brett Fairy. Patient lives with her husband used a walker prior to admission and was independent with assistive device. Presented 07/29/2014 with increased spasticity secondary to multiple sclerosis. Patient has a baclofen pump in place. Underwent baclofen pump replacement 07/29/2014 per Dr. Vertell Limber. Hospital course pain management as well as titration and baclofen to aid in her spasticity.  Subjective/Complaints: Up in room Mod I to BR without difficulty Review of Systems - Negative except mild weakness in legs (improving) Objective:     Vital Signs: Blood pressure 111/53, pulse 66, temperature 97.8 F (36.6 C), temperature source Oral, resp. rate 18, height 5\' 3"  (1.6 m), weight 119.6 kg (263 lb 10.7 oz), SpO2 97 %. No results found. Results for orders placed or performed during the hospital encounter of 07/31/14 (from the past 72 hour(s))  Protime-INR     Status: Abnormal   Collection Time: 08/09/14  4:19 AM  Result Value Ref Range   Prothrombin Time 29.0 (H) 11.6 - 15.2 seconds   INR 2.71 (H) 0.00 - 1.49  Protime-INR     Status: Abnormal   Collection Time: 08/10/14  7:40 AM  Result Value Ref Range   Prothrombin Time 24.1 (H) 11.6 - 15.2 seconds   INR 2.14 (H) 0.00 - 1.49  CBC     Status: None   Collection Time: 08/10/14  7:40 AM  Result Value Ref Range   WBC 6.4 4.0 - 10.5 K/uL   RBC 4.69 3.87 - 5.11 MIL/uL   Hemoglobin 14.2 12.0 - 15.0 g/dL   HCT 43.2 36.0 - 46.0 %   MCV 92.1 78.0 - 100.0 fL   MCH 30.3 26.0 - 34.0 pg   MCHC 32.9 30.0 - 36.0 g/dL   RDW 12.8 11.5 - 15.5 %   Platelets 211 150 - 400 K/uL  Protime-INR     Status: Abnormal   Collection Time: 08/11/14  5:58 AM  Result Value Ref Range   Prothrombin Time 23.2 (H) 11.6 - 15.2 seconds   INR 2.04 (H) 0.00 - 1.49      Constitutional: She is oriented to person,  place, and time.  HENT:  Head: Normocephalic.  Eyes: EOM are normal.  Neck: Normal range of motion. Neck supple. No thyromegaly present.  Cardiovascular: Normal rate. An irregularly irregular rhythm present. HR in 70's. 1+ edema bilateral LE Respiratory: Effort normal and breath sounds normal. No respiratory distress.  GI: Soft. Bowel sounds are normal. She exhibits no distension. There is no tenderness.  Neurological: She is alert and oriented to person, place, and time.  Reflex Scores:  Patellar reflexes are 0 on the right side and 0 on the left side.  Achilles reflexes are 0 on the right side and 0 on the left side. Follows all commands reduced tone persistent in bilateral lower limbs  Motor strength is 5/5 bilateral deltoid, biceps, triceps, grip, 3 minus right hip flexor and knee extensor ankle dorsiflexor and EHL, 4/5 and left hip flexor and knee extensor and ankle dorsiflexor and EHL Sensory intact to light touch in both upper and lower limbs with paresthesias noted in the upper extremities. Cranial nerves II through XII intact   Skin: Skin is warm and dry. Pump/surgical site intact. Maculopapular rash resolving on abdomen and flanks  Assessment/Plan: 1. Functional deficits secondary to multiple sclerosis with pseudo-exacerbation following baclofen pump replacement Stable for D/C today F/u PCP  in 1-2 weeks F/u PM&R 3 weeks See D/C summary See D/C instructions FIM: FIM - Bathing Bathing Steps Patient Completed: Chest, Right Arm, Left Arm, Abdomen, Front perineal area, Buttocks, Right upper leg, Left upper leg, Right lower leg (including foot), Left lower leg (including foot) Bathing: 6: Assistive device (Comment) (using transfer bench)  FIM - Upper Body Dressing/Undressing Upper body dressing/undressing steps patient completed: Thread/unthread right sleeve of pullover shirt/dresss, Thread/unthread left sleeve of pullover shirt/dress, Put head through opening of pull  over shirt/dress, Pull shirt over trunk, Thread/unthread right bra strap, Thread/unthread left bra strap, Hook/unhook bra Upper body dressing/undressing: 7: Complete Independence: No helper FIM - Lower Body Dressing/Undressing Lower body dressing/undressing steps patient completed: Thread/unthread right underwear leg, Thread/unthread left underwear leg, Thread/unthread right pants leg, Thread/unthread left pants leg, Pull underwear up/down, Pull pants up/down, Don/Doff left sock, Don/Doff right sock Lower body dressing/undressing: 6: Assistive device (Comment)  FIM - Toileting Toileting steps completed by patient: Adjust clothing prior to toileting, Performs perineal hygiene, Adjust clothing after toileting Toileting Assistive Devices: Grab bar or rail for support Toileting: 6: Assistive device: No helper  FIM - Radio producer Devices: Grab bars, Elevated toilet seat, Insurance account manager Transfers: 6-Assistive device: No helper  FIM - Control and instrumentation engineer Devices: Copy: 6: Assistive device: no helper  FIM - Locomotion: Wheelchair Distance: 75'  Locomotion: Wheelchair: 6: Travels 150 ft or more, turns around, maneuvers to table, bed or toilet, negotiates 3% grade: maneuvers on rugs and over door sills independently FIM - Locomotion: Ambulation Locomotion: Ambulation Assistive Devices: Administrator Ambulation/Gait Assistance: 6: Modified independent (Device/Increase time) Locomotion: Ambulation: 5: Household Independent - travels 45 - 149 ft independent or modified independent  Comprehension Comprehension Mode: Auditory Comprehension: 7-Follows complex conversation/direction: With no assist  Expression Expression Mode: Verbal Expression: 7-Expresses complex ideas: With no assist  Social Interaction Social Interaction: 6-Interacts appropriately with others with medication or extra time (anti-anxiety,  antidepressant).  Problem Solving Problem Solving: 7-Solves complex problems: Recognizes & self-corrects  Memory Memory: 7-Complete Independence: No helper  Medical Problem List and Plan: 1. Functional deficits secondary to multiple sclerosis with pseudo-exacerbation following baclofen pump replacement  -interrogated pump 12/14-- I reduced basal rate to 41mcg/hr---same overnight rate  -pump alarm date is now 11/14/14 2. DVT Prophylaxis/Anticoagulation: Chronic Coumadin for atrial fibrillation. Monitor for any bleeding episodes 3. Pain Management: Neurontin 300 mg 3 times a day, hydrocodone as needed. Monitor with increased mobility 4. Mood/depression/anxiety: Wellbutrin 300 mg daily, Xanax 0.5 mg twice a day as needed. Provide emotional support  -  neuropsych to see for anxiety and cognitive assessment 5. Neuropsych: This patient is capable of making decisions on her own behalf. 6. Skin/Wound Care: Routine skin checks 7. Fluids/Electrolytes/Nutrition: Strict I and O's. Follow-up chemistries in am   -encourage adequate fluid intake  8. Hypertension. With orthostatic drops, flomax changed to qhs   Lasix 40 mg twice a day tends to drop her bp  -changed lasix to 40mg  am and 20mg  pm  - Bystolic 20 mg daily---would like to continue to control HR.    -bilateral LE ACE wraps and elevation for edema 9. Hypothyroidism.Marland Kitchen Synthroid. 10. Hyperlipidemia. 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 Adjusedt bowel program.allow lactulose bid,    -Continue Flomax 0.4 mg daily.    -ucx neg---changed keflex back to the QHS dose  skin folds LOS (Days) 11    A FACE TO FACE EVALUATION WAS PERFORMED  Vaneta Hammontree E 08/11/2014, 8:27 AM

## 2014-08-11 NOTE — Progress Notes (Signed)
[facility] for coumadin Indication: atrial fibrillation  Allergies  Allergen Reactions  . Oxycodone     Oral oxycodone causes vomiting  . Chlorhexidine Gluconate     Sores at site  . Zoloft [Sertraline] Hives, Swelling and Rash   Patient Measurements: Height: 5\' 3"  (160 cm) Weight: 263 lb 10.7 oz (119.6 kg) IBW/kg (Calculated) : 52.4  Vital Signs: Temp: 97.8 F (36.6 C) (12/22 0511) Temp Source: Oral (12/22 0511) BP: 111/53 mmHg (12/22 0511) Pulse Rate: 66 (12/22 0511)  Labs:  Recent Labs  08/09/14 0419 08/10/14 0740 08/11/14 0558  HGB  --  14.2  --   HCT  --  43.2  --   PLT  --  211  --   LABPROT 29.0* 24.1* 23.2*  INR 2.71* 2.14* 2.04*    Estimated Creatinine Clearance: 85.4 mL/min (by C-G formula based on Cr of 0.76).  Assessment: 68 yo female on chronic coumadin for hx of afib s/p baclofen pump replacement. INR today is 2.0 which remains therapeutic, although trending down.   No CBC stable with Hgb 14.2 and pltc 211K, no bleeding noted. She is back on her home regimen of 5mg  daily and may still be showing effect of the 7.5mg  dose she had on 12/17.   PTA dose: Coumadin 5 mg po daily  Goal of Therapy:  INR 2-3 Monitor platelets by anticoagulation protocol: Yes   Plan:  - Warfarin 5mg  tonight - INR in AM - follow for s/s bleeding  Erin Hearing PharmD., BCPS Clinical Pharmacist Pager [phone] 08/11/2014 12:53 PM

## 2014-08-12 ENCOUNTER — Telehealth: Payer: Self-pay | Admitting: Neurology

## 2014-08-12 NOTE — Discharge Summary (Signed)
NAME:  Toni [name], Toni [name]                   ACCOUNT NO.:  MEDICAL RECORD NO.:  [id number]  LOCATION:                                 FACILITY:  PHYSICIAN:  Lauraine Rinne, P.A.  DATE OF BIRTH:  1945/11/25  DATE OF ADMISSION:  07/31/2014 DATE OF DISCHARGE:  08/11/2014                              DISCHARGE SUMMARY   DISCHARGE DIAGNOSES: 1. Functional deficits secondary to multiple sclerosis with pseudo     exacerbation following baclofen pump replacement. 2. Chronic Coumadin for deep venous thrombosis prophylaxis. 3. Pain management. 4. Hypertension. 5. Hypothyroidism. 6. Hyperlipidemia. 7. Neurogenic bowel and bladder.  HISTORY OF PRESENT ILLNESS:  This is a 68 year old right-handed female, history of atrial fibrillation on chronic Coumadin and multiple sclerosis, who lives with her husband, used a walker prior to admission and was independent with assistive device.  She presented on July 29, 2014, with increased spasticity secondary to multiple sclerosis.  She has a baclofen pump in place, and stated it had been malfunctioning for couple of months, not using the usual dose of baclofen.  She underwent baclofen pump replacement on July 29, 2014, per Dr. Vertell Limber.  Hospital course pain management as well as titration of baclofen.  Physical and occupational therapy are ongoing.  The patient was admitted for a comprehensive rehab program.  PAST MEDICAL HISTORY:  See discharge diagnoses.  SOCIAL HISTORY:  Lives with spouse.  FUNCTIONAL HISTORY:  Prior to admission used a walker.  Functional status upon admission to rehab services was minimal assist sit-to-stand, minimal assist stand pivot transfers, and min-to-mod assist activities of daily living.  PHYSICAL EXAMINATION:  VITAL SIGNS:  Blood pressure 122/44, pulse 98, temperature 98, and respirations 18.  This was an alert female and oriented x3. LUNGS:  Clear to auscultation. CARDIAC:  Regular rate and rhythm. ABDOMEN:   Soft and nontender.  Good bowel sounds. EXTREMITIES:  She exhibited decreased tone in bilateral lower limbs. Motor strength is 5/5 to the deltoids, biceps, and triceps.  REHABILITATION HOSPITAL COURSE:  The patient was admitted to inpatient rehab services with therapies initiated on a 3-hour daily basis consisting of physical therapy, occupational therapy, and rehabilitation nursing.  The following issues were addressed during the patient's rehabilitation stay.  Pertaining to Mrs. Hills functional deficits secondary to multiple sclerosis, she had undergone replacement of baclofen pump per Neurosurgery, Dr. Shaune Spittle.  She would follow up with as an outpatient.  Her tone and spasticity continued to improve greatly. Pain management with the use of Neurontin as well as hydrocodone for breakthrough pain.  She did have a history of anxiety and depression, continued on Wellbutrin as well as Xanax as needed with emotional of support provided.  She continued on chronic Coumadin as advised with no bleeding episodes and would follow up with her PCP.  Blood pressures were well controlled with no orthostatic changes.  She continued on Bystolic for rate control.  Neurogenic bowel and bladder,  she continued on chronic Keflex daily and denied any dysuria.  The patient received weekly collaborative interdisciplinary team conferences to discuss estimated length of stay, family teaching, and any barriers to her discharge.  The patient was discharged, ambulatory  level modified, independent with a rolling walker for household distances, and using a wheelchair for longer distances, modified independent on level surfaces. She was needing some simple set up for activities of daily living.  Her strength and endurance had greatly improved.  She was encouraged overall progress.  Plan was discharge to home with ongoing therapies dictated as per rehab services.  DISCHARGE MEDICATIONS:  Wellbutrin 300 mg p.o.  daily, Os-Cal twice daily, Keflex 250 mg p.o. at bedtime, vitamin D 2000 units p.o. daily, Colace 100 mg p.o. b.i.d., Pepcid 10 mg p.o. b.i.d., Flonase 1 spray each nostril daily, Lasix 20 mg alternating with 40 mg, Neurontin 300 mg p.o. t.i.d., Synthroid 75 mcg p.o. daily, Claritin 10 mg daily, multivitamin daily, Bystolic 20 mg p.o. daily, Lovaza 1000 mg p.o. daily, Protonix 40 mg p.o. daily, FiberCon twice daily, Zocor 20 mg p.o. at bedtime, Flomax 0.4 mg daily, Coumadin 5 mg daily adjusted accordingly for an INR of 2.0 to 3.0.  SPECIAL INSTRUCTIONS:  A home health nurse arranged to check INR on December 24th results to [facility].  Diet was regular.  The patient will follow up with Dr. Shaune Spittle, Neurosurgery, call for appointment; Dr. Alger Simons, the outpatient rehab service office as needed.     Lauraine Rinne, P.A.     DA/MEDQ  D:  08/11/2014  T:  08/11/2014  Job:  [id number]  cc:   Marchia Meiers. Vertell Limber, M.D. Berneta Sages, M.D. Meredith Staggers, M.D.

## 2014-08-12 NOTE — Telephone Encounter (Signed)
Patient has new Baclofen pump and cancelled pump refill appointment scheduled 12/28.  Questioning when should she return for refill.  Please call and advise.

## 2014-08-13 DIAGNOSIS — K592 Neurogenic bowel, not elsewhere classified: Secondary | ICD-10-CM | POA: Diagnosis not present

## 2014-08-13 DIAGNOSIS — T82514D Breakdown (mechanical) of infusion catheter, subsequent encounter: Secondary | ICD-10-CM | POA: Diagnosis not present

## 2014-08-13 DIAGNOSIS — M6281 Muscle weakness (generalized): Secondary | ICD-10-CM | POA: Diagnosis not present

## 2014-08-13 DIAGNOSIS — G35 Multiple sclerosis: Secondary | ICD-10-CM | POA: Diagnosis not present

## 2014-08-13 DIAGNOSIS — Z7901 Long term (current) use of anticoagulants: Secondary | ICD-10-CM | POA: Diagnosis not present

## 2014-08-13 DIAGNOSIS — E039 Hypothyroidism, unspecified: Secondary | ICD-10-CM | POA: Diagnosis not present

## 2014-08-13 DIAGNOSIS — I4891 Unspecified atrial fibrillation: Secondary | ICD-10-CM | POA: Diagnosis not present

## 2014-08-13 DIAGNOSIS — N319 Neuromuscular dysfunction of bladder, unspecified: Secondary | ICD-10-CM | POA: Diagnosis not present

## 2014-08-13 DIAGNOSIS — R269 Unspecified abnormalities of gait and mobility: Secondary | ICD-10-CM | POA: Diagnosis not present

## 2014-08-13 DIAGNOSIS — E785 Hyperlipidemia, unspecified: Secondary | ICD-10-CM | POA: Diagnosis not present

## 2014-08-13 DIAGNOSIS — I10 Essential (primary) hypertension: Secondary | ICD-10-CM | POA: Diagnosis not present

## 2014-08-15 LAB — POCT INR: INR: 1.4

## 2014-08-17 ENCOUNTER — Ambulatory Visit (INDEPENDENT_AMBULATORY_CARE_PROVIDER_SITE_OTHER): Payer: Medicare Other | Admitting: Pharmacist Clinician (PhC)/ Clinical Pharmacy Specialist

## 2014-08-17 ENCOUNTER — Encounter: Payer: Self-pay | Admitting: Neurology

## 2014-08-17 DIAGNOSIS — I10 Essential (primary) hypertension: Secondary | ICD-10-CM | POA: Diagnosis not present

## 2014-08-17 DIAGNOSIS — I4891 Unspecified atrial fibrillation: Secondary | ICD-10-CM

## 2014-08-17 DIAGNOSIS — G35 Multiple sclerosis: Secondary | ICD-10-CM | POA: Diagnosis not present

## 2014-08-17 DIAGNOSIS — T82514D Breakdown (mechanical) of infusion catheter, subsequent encounter: Secondary | ICD-10-CM | POA: Diagnosis not present

## 2014-08-17 DIAGNOSIS — M6281 Muscle weakness (generalized): Secondary | ICD-10-CM | POA: Diagnosis not present

## 2014-08-17 DIAGNOSIS — R269 Unspecified abnormalities of gait and mobility: Secondary | ICD-10-CM | POA: Diagnosis not present

## 2014-08-17 NOTE — Telephone Encounter (Signed)
I called the patient. The patient will be seen on January 4, I easily could come in briefly and interrogate the pump, determine what the alarm date is, and set up follow-up for a pump refill.

## 2014-08-17 NOTE — Telephone Encounter (Signed)
Spoke to patient and she had her new pump put in on 07-29-14, and has a follow up appointment with Dr. Brett Fairy on 08-24-13 at 1530.  She wanted to know if Dr. [name] could see level of Baclofen at that time or if she needs a separate appointment.

## 2014-08-18 DIAGNOSIS — T82514D Breakdown (mechanical) of infusion catheter, subsequent encounter: Secondary | ICD-10-CM | POA: Diagnosis not present

## 2014-08-18 DIAGNOSIS — I10 Essential (primary) hypertension: Secondary | ICD-10-CM | POA: Diagnosis not present

## 2014-08-18 DIAGNOSIS — I4891 Unspecified atrial fibrillation: Secondary | ICD-10-CM | POA: Diagnosis not present

## 2014-08-18 DIAGNOSIS — G35 Multiple sclerosis: Secondary | ICD-10-CM | POA: Diagnosis not present

## 2014-08-18 DIAGNOSIS — R269 Unspecified abnormalities of gait and mobility: Secondary | ICD-10-CM | POA: Diagnosis not present

## 2014-08-18 DIAGNOSIS — M6281 Muscle weakness (generalized): Secondary | ICD-10-CM | POA: Diagnosis not present

## 2014-08-19 ENCOUNTER — Telehealth: Payer: Self-pay | Admitting: Pharmacist Clinician (PhC)/ Clinical Pharmacy Specialist

## 2014-08-19 ENCOUNTER — Ambulatory Visit (INDEPENDENT_AMBULATORY_CARE_PROVIDER_SITE_OTHER): Payer: Medicare Other | Admitting: Pharmacist Clinician (PhC)/ Clinical Pharmacy Specialist

## 2014-08-19 DIAGNOSIS — I4891 Unspecified atrial fibrillation: Secondary | ICD-10-CM | POA: Diagnosis not present

## 2014-08-19 DIAGNOSIS — M6281 Muscle weakness (generalized): Secondary | ICD-10-CM | POA: Diagnosis not present

## 2014-08-19 DIAGNOSIS — T82514D Breakdown (mechanical) of infusion catheter, subsequent encounter: Secondary | ICD-10-CM | POA: Diagnosis not present

## 2014-08-19 DIAGNOSIS — R269 Unspecified abnormalities of gait and mobility: Secondary | ICD-10-CM | POA: Diagnosis not present

## 2014-08-19 DIAGNOSIS — G35 Multiple sclerosis: Secondary | ICD-10-CM | POA: Diagnosis not present

## 2014-08-19 DIAGNOSIS — I10 Essential (primary) hypertension: Secondary | ICD-10-CM | POA: Diagnosis not present

## 2014-08-19 LAB — POCT INR: INR: 2.5

## 2014-08-19 NOTE — Telephone Encounter (Signed)
[name] PHARM-D is aware of the call and will contact [place] and patient,per patient.

## 2014-08-19 NOTE — Telephone Encounter (Signed)
Toni [name] states that she has called in several times today to report the pt's PT INR results at PT- 21.8 and INR- 2.5. And she states that she needs a verbal order for these last to be drawn next week. Please call  Thanks

## 2014-08-20 DIAGNOSIS — I4891 Unspecified atrial fibrillation: Secondary | ICD-10-CM | POA: Diagnosis not present

## 2014-08-20 DIAGNOSIS — I10 Essential (primary) hypertension: Secondary | ICD-10-CM | POA: Diagnosis not present

## 2014-08-20 DIAGNOSIS — R269 Unspecified abnormalities of gait and mobility: Secondary | ICD-10-CM | POA: Diagnosis not present

## 2014-08-20 DIAGNOSIS — T82514D Breakdown (mechanical) of infusion catheter, subsequent encounter: Secondary | ICD-10-CM | POA: Diagnosis not present

## 2014-08-20 DIAGNOSIS — G35 Multiple sclerosis: Secondary | ICD-10-CM | POA: Diagnosis not present

## 2014-08-20 DIAGNOSIS — M6281 Muscle weakness (generalized): Secondary | ICD-10-CM | POA: Diagnosis not present

## 2014-08-24 ENCOUNTER — Ambulatory Visit: Payer: Medicare Other | Admitting: Nurse Practitioner

## 2014-08-24 ENCOUNTER — Encounter: Payer: Self-pay | Admitting: Neurology

## 2014-08-24 ENCOUNTER — Ambulatory Visit (INDEPENDENT_AMBULATORY_CARE_PROVIDER_SITE_OTHER): Payer: Medicare Other | Admitting: Neurology

## 2014-08-24 VITALS — BP 121/75 | HR 73 | Resp 18 | Ht 63.0 in | Wt 261.0 lb

## 2014-08-24 DIAGNOSIS — G35 Multiple sclerosis: Secondary | ICD-10-CM | POA: Diagnosis not present

## 2014-08-24 DIAGNOSIS — I4891 Unspecified atrial fibrillation: Secondary | ICD-10-CM | POA: Diagnosis not present

## 2014-08-24 DIAGNOSIS — I10 Essential (primary) hypertension: Secondary | ICD-10-CM | POA: Diagnosis not present

## 2014-08-24 DIAGNOSIS — N319 Neuromuscular dysfunction of bladder, unspecified: Secondary | ICD-10-CM | POA: Diagnosis not present

## 2014-08-24 DIAGNOSIS — M6281 Muscle weakness (generalized): Secondary | ICD-10-CM | POA: Diagnosis not present

## 2014-08-24 DIAGNOSIS — N3001 Acute cystitis with hematuria: Secondary | ICD-10-CM

## 2014-08-24 DIAGNOSIS — R269 Unspecified abnormalities of gait and mobility: Secondary | ICD-10-CM | POA: Diagnosis not present

## 2014-08-24 DIAGNOSIS — T82514D Breakdown (mechanical) of infusion catheter, subsequent encounter: Secondary | ICD-10-CM | POA: Diagnosis not present

## 2014-08-24 MED ORDER — CORTICOTROPIN 80 UNIT/ML IJ GEL: INTRAMUSCULAR | Status: DC

## 2014-08-24 NOTE — Progress Notes (Signed)
Provider:  [name], M D  Referring Provider: [name], MD Primary Care Physician:  [name], MD  Chief Complaint  Patient presents with  . 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 female seen here as a revisit for MS follow up. The patient has had her baclofen pump replaced in December 2015 . The pump refill went well.  Before the pump was replaced, she had noted a higher and higher need for oral baclofen supplement. She uses ACTH to avoid the previously steroid insuced a fib , she has less weight gain and less HTN on the ACTH.  Her right leg is weaker than last year, but she has not fallen.   She is her for a routine visit , has no acute questions.   Her extensive history is re viewed in previous noted.   Review of Systems: Out of a complete 14 system review, the patient complains of only the following symptoms, and all other reviewed systems are negative. Wheelchair bound.  Now back at home after rehab with PT> carpal tunnel.   History   Social History  . Marital Status: Married    Spouse Name: N/A    Number of Children: 2  . Years of Education: COLLEGE   Occupational History  . RETIRED    Social History Main Topics  . Smoking status: Former Smoker    Quit date: 08/21/1976  . Smokeless tobacco: Never Used     Comment: QUIT 1978  . Alcohol Use: Yes     Comment: SOCIALLY  . Drug Use: No  . Sexual Activity: Yes   Other Topics Concern  . Not on file   Social History Narrative   Lives in [place] with Tillman Sers [phone], [phone]          Family History  Problem Relation Age of Onset  . Coronary artery disease Father     at age 26  . Coronary artery disease Maternal Grandmother   . Depression Maternal Grandmother   . Cancer Paternal Grandmother   . Depression Mother   . Alcoholism Son     Past Medical History  Diagnosis Date  . Multiple sclerosis     has had this may 1989-[facility]  reg doc  . Hypertension   . PAF (paroxysmal atrial fibrillation)     on coumadin; documented on monitor 06/2010  . Thyroid disease   . HLD (hyperlipidemia)   . Shortness of breath   . Pneumonia   . Hyperthyroidism   . Edema     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' ablation of R GSV 2012  . Depression   . Anxiety   . Headache(784.0)     MIGRAINE   . Chronic pain   . Hx of echocardiogram 04/22/10    EF 55-60%, no valve issues  . History of stress test 06/21/10    limited exam with some degree of breast attenuatin of the apex however a component of apical ischemia is not excluded, EF 62%; cardiac cath   . Sleep apnea     cpap    Past Surgical History  Procedure Laterality Date  . Cervical fusion      with correction-june 2005  . Infusion pump implantation      baclofen infusion in lower abd  . Venous ablation  12/16/10    radiofreq ablation -Dr Elisabeth Cara  and [facility]  . Child births      X93  . Cardiac catheterization  06/22/10    normal coronary arteries, PAF  . Tonsillectomy    . Pain pump revision N/A 07/29/2014    Procedure: Baclofen pump replacement;  Surgeon: Erline Levine, MD;  Location: [facility];  Service: Neurosurgery;  Laterality: N/A;  Baclofen pump replacement    Current Outpatient Prescriptions  Medication Sig Dispense Refill  . ALPRAZolam (XANAX) 0.5 MG tablet Take 1 tablet (0.5 mg total) by mouth 2 (two) times daily as needed for anxiety. 180 tablet 1  . baclofen (LIORESAL) 20 MG tablet Take 1 tablet (20 mg total) by mouth 3 (three) times daily as needed for muscle spasms. 90 each 3  . buPROPion (WELLBUTRIN XL) 300 MG 24 hr tablet Take 300 mg by mouth daily.    . calcium carbonate (OS-CAL) 600 MG TABS tablet Take 600 mg by mouth 2 (two) times daily with a meal.    . cetirizine (ZYRTEC) 10 MG tablet Take 10 mg by mouth daily.    . Cholecalciferol (VITAMIN D) 1000 UNITS capsule Take 2,000 Units by mouth daily. 2 capsules    . co-enzyme Q-10 30 MG  capsule Take 30 mg by mouth daily.    . corticotropin (ATHACAR H.P.) 80 UNIT/ML injectable gel 5 injection for 5 Days in a row every monthly from October on, 2015 .    . docusate sodium (COLACE) 100 MG capsule Take 100 mg by mouth 2 (two) times daily.    . fluticasone (FLONASE) 50 MCG/ACT nasal spray Place 1 spray into both nostrils Nightly. 1 g 2  . furosemide (LASIX) 40 MG tablet Take 40 mg in am and 20 mg in pm    . gabapentin (NEURONTIN) 300 MG capsule Take 1 capsule (300 mg total) by mouth 3 (three) times daily. Pain. 540 capsule 2  . GENERLAC 10 GM/15ML SOLN 1-2 tbsp daily  (lactulose)    . HYDROcodone-acetaminophen (NORCO/VICODIN) 5-325 MG per tablet Take 1 tablet by mouth 2 (two) times daily as needed for moderate pain.    Marland Kitchen levothyroxine (SYNTHROID, LEVOTHROID) 75 MCG tablet Take 75 mcg by mouth daily.    . Multiple Vitamin (MULITIVITAMIN WITH MINERALS) TABS Take 1 tablet by mouth daily.    . Nebivolol HCl 20 MG TABS Take 20 mg by mouth daily.     . Omega-3 Fatty Acids (FISH OIL) 600 MG CAPS Take 600 mg by mouth daily.     Marland Kitchen omeprazole (PRILOSEC) 20 MG capsule Take 20 mg by mouth daily.     . ranitidine (ZANTAC) 150 MG tablet Take 150 mg by mouth at bedtime.     . simvastatin (ZOCOR) 20 MG tablet Take 20 mg by mouth at bedtime.     . Tamsulosin HCl (FLOMAX) 0.4 MG CAPS Take 0.4 mg by mouth daily.    Marland Kitchen warfarin (COUMADIN) 5 MG tablet Take 1 tablet (5 mg total) by mouth daily. 90 tablet 3  . Wheat Dextrin (BENEFIBER PO) Take 1 packet by mouth 2 (two) times daily.     No current facility-administered medications for this visit.    Allergies as of 08/24/2014 - Review Complete 08/24/2014  Allergen Reaction Noted  . Oxycodone  05/12/2013  . Chlorhexidine gluconate  11/26/2011  . Zoloft [sertraline] Hives, Swelling, and Rash 05/15/2013    Vitals: BP 121/75 mmHg  Pulse 73  Resp 18  Ht 5\' 3"  (1.6 m)  Wt 261 lb (118.389 kg)  BMI  46.25 kg/m2 Last Weight:  Wt Readings from Last 1  Encounters:  08/24/14 261 lb (118.389 kg)   Last Height:   Ht Readings from Last 1 Encounters:  08/24/14 5\' 3"  (1.6 m)    Physical exam:   Height ;  5.4 ", 260 pounds  General: The patient is awake, alert and appears not in acute distress. The patient is well groomed.  Head: Normocephalic, atraumatic. Neck is supple. Mallampati 4 neck circumference:19 i nches .  Cardiovascular: Regular rate and rhythm today , without murmurs or carotid bruit, and without distended neck veins.  Respiratory: Lungs are clear to auscultation.  Skin: evidence of edema, not Rash, Injection site reaction.  Trunk: BMI is elevated. Neurologic exam :  The patient is awake and alert, oriented to place and time.  Memory subjective described as intact. There is a normal attention span & concentration ability.  Speech without dysarthria, dysphonia , delayed wordfinding- aphasia.  Mood and affect are appropriate.   Cranial nerves:  Pupils are equal and briskly reactive to light. Extraocular movements in vertical and horizontal planes intact and without nystagmus.  Visual fields by finger perimetry are intact.  Hearing to finger rub intact. Facial sensation intact to fine touch.  Facial motor strength is symmetric and tongue and uvula move midline.  Motor exam: Patient reports that she is losing dexterity in her right hand feels clumsy and also numbish. She reports a gloved feeling.  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  She reports trouble with spitting up fine objects like picking lint or picking up a pill. Numb fingers. Lost proprioception.   Decreased strength in all extremities.  Sensory: Fine touch, pinprick and vibration were restricted in the right, dominant hand, both feet Coordination:  He reports changing in her handwriting and trouble holding a pencil in her right him a dominant hand.  Rapid alternating movements in the fingers/hands is slow.  Gait and station  wheelchair.   Deep tendon reflexes: in the upper and lower extremities are attenuated, right babinski positive- up going.   ASSESSMENT AND PLAN  69 y.o. year old female  With  Multiple sclerosis; Hypertension; PAF (paroxysmal atrial fibrillation); Thyroid disease; obesity hypoventilation,   Pneumonia; Sleep apnea; Hyperthyroidism; Edema; Depression; Anxiety; Headache(784.0); Chronic pain; echocardiogram (04/22/10); and History of stress test (06/21/10) here with for routine followup of Primary Progressive MS.  PLAN :    Continue ACTHAR 5 days a month- every 30 days .  The PT has noted improvement while on ACTHAR. She reports better sleep, better bowl control and bladder control.  Continue Baclofen PUMP with Dr [name].   Keep next follow up appointment with me and NP  in  4-6 months.  She will have a blood draw in March, CBC and CMET with PCP.    Asencion Partridge Conal Shetley MD 08/24/2014

## 2014-08-25 ENCOUNTER — Telehealth: Payer: Self-pay | Admitting: Internal Medicine

## 2014-08-25 ENCOUNTER — Telehealth: Payer: Self-pay | Admitting: Neurology

## 2014-08-25 DIAGNOSIS — G35 Multiple sclerosis: Secondary | ICD-10-CM | POA: Diagnosis not present

## 2014-08-25 DIAGNOSIS — I10 Essential (primary) hypertension: Secondary | ICD-10-CM | POA: Diagnosis not present

## 2014-08-25 DIAGNOSIS — M6281 Muscle weakness (generalized): Secondary | ICD-10-CM | POA: Diagnosis not present

## 2014-08-25 DIAGNOSIS — R269 Unspecified abnormalities of gait and mobility: Secondary | ICD-10-CM | POA: Diagnosis not present

## 2014-08-25 DIAGNOSIS — T82514D Breakdown (mechanical) of infusion catheter, subsequent encounter: Secondary | ICD-10-CM | POA: Diagnosis not present

## 2014-08-25 DIAGNOSIS — I4891 Unspecified atrial fibrillation: Secondary | ICD-10-CM | POA: Diagnosis not present

## 2014-08-25 LAB — POCT INR: INR: 3.1

## 2014-08-25 NOTE — Telephone Encounter (Signed)
Caitlin w/ [facility] did home check for pt's INR today. She did state she drew a day early and that this was scheduled for Wednesday.  Pt was adjusted on 12/30, had INR of 2.5  Today's visit INR 3.1 PT 37.2  Will route to [place] to adjust, told Urban Gibson to make no changes for now but we would get back to her today or tomorrow w/ adjustments.  Pt can also be called directly.

## 2014-08-25 NOTE — Telephone Encounter (Signed)
The baclofen pump was interrogated, the next alarm date is on 11/14/2014. We'll need a revisit prior to this.

## 2014-08-26 ENCOUNTER — Ambulatory Visit (INDEPENDENT_AMBULATORY_CARE_PROVIDER_SITE_OTHER): Payer: Medicare Other | Admitting: Pharmacist Clinician (PhC)/ Clinical Pharmacy Specialist

## 2014-08-26 DIAGNOSIS — I4891 Unspecified atrial fibrillation: Secondary | ICD-10-CM

## 2014-08-26 NOTE — Telephone Encounter (Signed)
Spoke to patient and she is scheduled 11-12-14 for Baclofen pump refill.

## 2014-08-27 DIAGNOSIS — M6281 Muscle weakness (generalized): Secondary | ICD-10-CM | POA: Diagnosis not present

## 2014-08-27 DIAGNOSIS — I10 Essential (primary) hypertension: Secondary | ICD-10-CM | POA: Diagnosis not present

## 2014-08-27 DIAGNOSIS — G35 Multiple sclerosis: Secondary | ICD-10-CM | POA: Diagnosis not present

## 2014-08-27 DIAGNOSIS — T82514D Breakdown (mechanical) of infusion catheter, subsequent encounter: Secondary | ICD-10-CM | POA: Diagnosis not present

## 2014-08-27 DIAGNOSIS — I4891 Unspecified atrial fibrillation: Secondary | ICD-10-CM | POA: Diagnosis not present

## 2014-08-27 DIAGNOSIS — R269 Unspecified abnormalities of gait and mobility: Secondary | ICD-10-CM | POA: Diagnosis not present

## 2014-09-01 DIAGNOSIS — G35 Multiple sclerosis: Secondary | ICD-10-CM | POA: Diagnosis not present

## 2014-09-01 DIAGNOSIS — M6281 Muscle weakness (generalized): Secondary | ICD-10-CM | POA: Diagnosis not present

## 2014-09-01 DIAGNOSIS — I10 Essential (primary) hypertension: Secondary | ICD-10-CM | POA: Diagnosis not present

## 2014-09-01 DIAGNOSIS — I4891 Unspecified atrial fibrillation: Secondary | ICD-10-CM | POA: Diagnosis not present

## 2014-09-01 DIAGNOSIS — T82514D Breakdown (mechanical) of infusion catheter, subsequent encounter: Secondary | ICD-10-CM | POA: Diagnosis not present

## 2014-09-01 DIAGNOSIS — R269 Unspecified abnormalities of gait and mobility: Secondary | ICD-10-CM | POA: Diagnosis not present

## 2014-09-02 ENCOUNTER — Telehealth: Payer: Self-pay | Admitting: *Deleted

## 2014-09-02 ENCOUNTER — Ambulatory Visit (INDEPENDENT_AMBULATORY_CARE_PROVIDER_SITE_OTHER): Payer: Medicare Other | Admitting: Pharmacist Clinician (PhC)/ Clinical Pharmacy Specialist

## 2014-09-02 DIAGNOSIS — T82514D Breakdown (mechanical) of infusion catheter, subsequent encounter: Secondary | ICD-10-CM | POA: Diagnosis not present

## 2014-09-02 DIAGNOSIS — M6281 Muscle weakness (generalized): Secondary | ICD-10-CM | POA: Diagnosis not present

## 2014-09-02 DIAGNOSIS — N952 Postmenopausal atrophic vaginitis: Secondary | ICD-10-CM | POA: Diagnosis not present

## 2014-09-02 DIAGNOSIS — I4891 Unspecified atrial fibrillation: Secondary | ICD-10-CM

## 2014-09-02 DIAGNOSIS — R269 Unspecified abnormalities of gait and mobility: Secondary | ICD-10-CM | POA: Diagnosis not present

## 2014-09-02 DIAGNOSIS — N312 Flaccid neuropathic bladder, not elsewhere classified: Secondary | ICD-10-CM | POA: Diagnosis not present

## 2014-09-02 DIAGNOSIS — I10 Essential (primary) hypertension: Secondary | ICD-10-CM | POA: Diagnosis not present

## 2014-09-02 DIAGNOSIS — G35 Multiple sclerosis: Secondary | ICD-10-CM | POA: Diagnosis not present

## 2014-09-02 LAB — POCT INR: INR: 2.9

## 2014-09-02 NOTE — Telephone Encounter (Signed)
Faxed order to check PT/INR and call result to [facility] (09/02/14)

## 2014-09-03 DIAGNOSIS — G35 Multiple sclerosis: Secondary | ICD-10-CM | POA: Diagnosis not present

## 2014-09-03 DIAGNOSIS — Z1389 Encounter for screening for other disorder: Secondary | ICD-10-CM | POA: Diagnosis not present

## 2014-09-03 DIAGNOSIS — N3289 Other specified disorders of bladder: Secondary | ICD-10-CM | POA: Diagnosis not present

## 2014-09-03 DIAGNOSIS — I10 Essential (primary) hypertension: Secondary | ICD-10-CM | POA: Diagnosis not present

## 2014-09-03 DIAGNOSIS — M6281 Muscle weakness (generalized): Secondary | ICD-10-CM | POA: Diagnosis not present

## 2014-09-03 DIAGNOSIS — R7301 Impaired fasting glucose: Secondary | ICD-10-CM | POA: Diagnosis not present

## 2014-09-03 DIAGNOSIS — Z6841 Body Mass Index (BMI) 40.0 and over, adult: Secondary | ICD-10-CM | POA: Diagnosis not present

## 2014-09-03 DIAGNOSIS — R269 Unspecified abnormalities of gait and mobility: Secondary | ICD-10-CM | POA: Diagnosis not present

## 2014-09-03 DIAGNOSIS — I4891 Unspecified atrial fibrillation: Secondary | ICD-10-CM | POA: Diagnosis not present

## 2014-09-03 DIAGNOSIS — T82514D Breakdown (mechanical) of infusion catheter, subsequent encounter: Secondary | ICD-10-CM | POA: Diagnosis not present

## 2014-09-03 DIAGNOSIS — R609 Edema, unspecified: Secondary | ICD-10-CM | POA: Diagnosis not present

## 2014-09-04 DIAGNOSIS — M6281 Muscle weakness (generalized): Secondary | ICD-10-CM | POA: Diagnosis not present

## 2014-09-04 DIAGNOSIS — N319 Neuromuscular dysfunction of bladder, unspecified: Secondary | ICD-10-CM

## 2014-09-04 DIAGNOSIS — G35 Multiple sclerosis: Secondary | ICD-10-CM | POA: Diagnosis not present

## 2014-09-04 DIAGNOSIS — I4891 Unspecified atrial fibrillation: Secondary | ICD-10-CM

## 2014-09-04 DIAGNOSIS — I10 Essential (primary) hypertension: Secondary | ICD-10-CM

## 2014-09-04 DIAGNOSIS — K592 Neurogenic bowel, not elsewhere classified: Secondary | ICD-10-CM

## 2014-09-04 DIAGNOSIS — T82514D Breakdown (mechanical) of infusion catheter, subsequent encounter: Secondary | ICD-10-CM | POA: Diagnosis not present

## 2014-09-04 DIAGNOSIS — R269 Unspecified abnormalities of gait and mobility: Secondary | ICD-10-CM | POA: Diagnosis not present

## 2014-09-04 DIAGNOSIS — E039 Hypothyroidism, unspecified: Secondary | ICD-10-CM

## 2014-09-04 DIAGNOSIS — E785 Hyperlipidemia, unspecified: Secondary | ICD-10-CM

## 2014-09-04 DIAGNOSIS — Z7901 Long term (current) use of anticoagulants: Secondary | ICD-10-CM

## 2014-09-08 DIAGNOSIS — I10 Essential (primary) hypertension: Secondary | ICD-10-CM | POA: Diagnosis not present

## 2014-09-08 DIAGNOSIS — M6281 Muscle weakness (generalized): Secondary | ICD-10-CM | POA: Diagnosis not present

## 2014-09-08 DIAGNOSIS — R269 Unspecified abnormalities of gait and mobility: Secondary | ICD-10-CM | POA: Diagnosis not present

## 2014-09-08 DIAGNOSIS — T82514D Breakdown (mechanical) of infusion catheter, subsequent encounter: Secondary | ICD-10-CM | POA: Diagnosis not present

## 2014-09-08 DIAGNOSIS — G35 Multiple sclerosis: Secondary | ICD-10-CM | POA: Diagnosis not present

## 2014-09-08 DIAGNOSIS — I4891 Unspecified atrial fibrillation: Secondary | ICD-10-CM | POA: Diagnosis not present

## 2014-09-09 ENCOUNTER — Telehealth: Payer: Self-pay | Admitting: *Deleted

## 2014-09-09 NOTE — Telephone Encounter (Signed)
Faxed order for PT/INR check 09/16/14 - call results to [facility]

## 2014-09-09 NOTE — Telephone Encounter (Signed)
Merry Proud from [facility] called about this patient.  Patient requested that her therapy be reduced to 2 times.  He just wanted [name] to know.

## 2014-09-10 DIAGNOSIS — R269 Unspecified abnormalities of gait and mobility: Secondary | ICD-10-CM | POA: Diagnosis not present

## 2014-09-10 DIAGNOSIS — I10 Essential (primary) hypertension: Secondary | ICD-10-CM | POA: Diagnosis not present

## 2014-09-10 DIAGNOSIS — T82514D Breakdown (mechanical) of infusion catheter, subsequent encounter: Secondary | ICD-10-CM | POA: Diagnosis not present

## 2014-09-10 DIAGNOSIS — G35 Multiple sclerosis: Secondary | ICD-10-CM | POA: Diagnosis not present

## 2014-09-10 DIAGNOSIS — M6281 Muscle weakness (generalized): Secondary | ICD-10-CM | POA: Diagnosis not present

## 2014-09-10 DIAGNOSIS — I4891 Unspecified atrial fibrillation: Secondary | ICD-10-CM | POA: Diagnosis not present

## 2014-09-15 DIAGNOSIS — T82514D Breakdown (mechanical) of infusion catheter, subsequent encounter: Secondary | ICD-10-CM | POA: Diagnosis not present

## 2014-09-15 DIAGNOSIS — M6281 Muscle weakness (generalized): Secondary | ICD-10-CM | POA: Diagnosis not present

## 2014-09-15 DIAGNOSIS — I4891 Unspecified atrial fibrillation: Secondary | ICD-10-CM | POA: Diagnosis not present

## 2014-09-15 DIAGNOSIS — R269 Unspecified abnormalities of gait and mobility: Secondary | ICD-10-CM | POA: Diagnosis not present

## 2014-09-15 DIAGNOSIS — I10 Essential (primary) hypertension: Secondary | ICD-10-CM | POA: Diagnosis not present

## 2014-09-15 DIAGNOSIS — G35 Multiple sclerosis: Secondary | ICD-10-CM | POA: Diagnosis not present

## 2014-09-16 ENCOUNTER — Ambulatory Visit (INDEPENDENT_AMBULATORY_CARE_PROVIDER_SITE_OTHER): Payer: Medicare Other | Admitting: Pharmacist Clinician (PhC)/ Clinical Pharmacy Specialist

## 2014-09-16 DIAGNOSIS — M6281 Muscle weakness (generalized): Secondary | ICD-10-CM | POA: Diagnosis not present

## 2014-09-16 DIAGNOSIS — T82514D Breakdown (mechanical) of infusion catheter, subsequent encounter: Secondary | ICD-10-CM | POA: Diagnosis not present

## 2014-09-16 DIAGNOSIS — I4891 Unspecified atrial fibrillation: Secondary | ICD-10-CM | POA: Diagnosis not present

## 2014-09-16 DIAGNOSIS — G35 Multiple sclerosis: Secondary | ICD-10-CM | POA: Diagnosis not present

## 2014-09-16 DIAGNOSIS — R269 Unspecified abnormalities of gait and mobility: Secondary | ICD-10-CM | POA: Diagnosis not present

## 2014-09-16 DIAGNOSIS — I10 Essential (primary) hypertension: Secondary | ICD-10-CM | POA: Diagnosis not present

## 2014-09-16 LAB — POCT INR: INR: 3.1

## 2014-09-17 DIAGNOSIS — M6281 Muscle weakness (generalized): Secondary | ICD-10-CM | POA: Diagnosis not present

## 2014-09-17 DIAGNOSIS — I4891 Unspecified atrial fibrillation: Secondary | ICD-10-CM | POA: Diagnosis not present

## 2014-09-17 DIAGNOSIS — T82514D Breakdown (mechanical) of infusion catheter, subsequent encounter: Secondary | ICD-10-CM | POA: Diagnosis not present

## 2014-09-17 DIAGNOSIS — R269 Unspecified abnormalities of gait and mobility: Secondary | ICD-10-CM | POA: Diagnosis not present

## 2014-09-17 DIAGNOSIS — G35 Multiple sclerosis: Secondary | ICD-10-CM | POA: Diagnosis not present

## 2014-09-17 DIAGNOSIS — I10 Essential (primary) hypertension: Secondary | ICD-10-CM | POA: Diagnosis not present

## 2014-09-21 ENCOUNTER — Telehealth: Payer: Self-pay | Admitting: *Deleted

## 2014-09-21 NOTE — Telephone Encounter (Signed)
Faxed orders r/t PT/INR and warfarin dosing to [facility]

## 2014-09-22 DIAGNOSIS — G35 Multiple sclerosis: Secondary | ICD-10-CM | POA: Diagnosis not present

## 2014-09-22 DIAGNOSIS — I10 Essential (primary) hypertension: Secondary | ICD-10-CM | POA: Diagnosis not present

## 2014-09-22 DIAGNOSIS — M6281 Muscle weakness (generalized): Secondary | ICD-10-CM | POA: Diagnosis not present

## 2014-09-22 DIAGNOSIS — T82514D Breakdown (mechanical) of infusion catheter, subsequent encounter: Secondary | ICD-10-CM | POA: Diagnosis not present

## 2014-09-22 DIAGNOSIS — I4891 Unspecified atrial fibrillation: Secondary | ICD-10-CM | POA: Diagnosis not present

## 2014-09-22 DIAGNOSIS — R269 Unspecified abnormalities of gait and mobility: Secondary | ICD-10-CM | POA: Diagnosis not present

## 2014-09-24 DIAGNOSIS — I4891 Unspecified atrial fibrillation: Secondary | ICD-10-CM | POA: Diagnosis not present

## 2014-09-24 DIAGNOSIS — R269 Unspecified abnormalities of gait and mobility: Secondary | ICD-10-CM | POA: Diagnosis not present

## 2014-09-24 DIAGNOSIS — T82514D Breakdown (mechanical) of infusion catheter, subsequent encounter: Secondary | ICD-10-CM | POA: Diagnosis not present

## 2014-09-24 DIAGNOSIS — G35 Multiple sclerosis: Secondary | ICD-10-CM | POA: Diagnosis not present

## 2014-09-24 DIAGNOSIS — I10 Essential (primary) hypertension: Secondary | ICD-10-CM | POA: Diagnosis not present

## 2014-09-24 DIAGNOSIS — M6281 Muscle weakness (generalized): Secondary | ICD-10-CM | POA: Diagnosis not present

## 2014-09-29 DIAGNOSIS — I4891 Unspecified atrial fibrillation: Secondary | ICD-10-CM | POA: Diagnosis not present

## 2014-09-29 DIAGNOSIS — G35 Multiple sclerosis: Secondary | ICD-10-CM | POA: Diagnosis not present

## 2014-09-29 DIAGNOSIS — M6281 Muscle weakness (generalized): Secondary | ICD-10-CM | POA: Diagnosis not present

## 2014-09-29 DIAGNOSIS — I10 Essential (primary) hypertension: Secondary | ICD-10-CM | POA: Diagnosis not present

## 2014-09-29 DIAGNOSIS — R269 Unspecified abnormalities of gait and mobility: Secondary | ICD-10-CM | POA: Diagnosis not present

## 2014-09-29 DIAGNOSIS — T82514D Breakdown (mechanical) of infusion catheter, subsequent encounter: Secondary | ICD-10-CM | POA: Diagnosis not present

## 2014-09-30 DIAGNOSIS — G35 Multiple sclerosis: Secondary | ICD-10-CM | POA: Diagnosis not present

## 2014-09-30 DIAGNOSIS — R609 Edema, unspecified: Secondary | ICD-10-CM | POA: Diagnosis not present

## 2014-09-30 DIAGNOSIS — I10 Essential (primary) hypertension: Secondary | ICD-10-CM | POA: Diagnosis not present

## 2014-09-30 DIAGNOSIS — Z6841 Body Mass Index (BMI) 40.0 and over, adult: Secondary | ICD-10-CM | POA: Diagnosis not present

## 2014-10-01 DIAGNOSIS — R269 Unspecified abnormalities of gait and mobility: Secondary | ICD-10-CM | POA: Diagnosis not present

## 2014-10-01 DIAGNOSIS — M6281 Muscle weakness (generalized): Secondary | ICD-10-CM | POA: Diagnosis not present

## 2014-10-01 DIAGNOSIS — T82514D Breakdown (mechanical) of infusion catheter, subsequent encounter: Secondary | ICD-10-CM | POA: Diagnosis not present

## 2014-10-01 DIAGNOSIS — I4891 Unspecified atrial fibrillation: Secondary | ICD-10-CM | POA: Diagnosis not present

## 2014-10-01 DIAGNOSIS — G35 Multiple sclerosis: Secondary | ICD-10-CM | POA: Diagnosis not present

## 2014-10-01 DIAGNOSIS — I10 Essential (primary) hypertension: Secondary | ICD-10-CM | POA: Diagnosis not present

## 2014-10-06 DIAGNOSIS — R269 Unspecified abnormalities of gait and mobility: Secondary | ICD-10-CM | POA: Diagnosis not present

## 2014-10-06 DIAGNOSIS — I4891 Unspecified atrial fibrillation: Secondary | ICD-10-CM | POA: Diagnosis not present

## 2014-10-06 DIAGNOSIS — M6281 Muscle weakness (generalized): Secondary | ICD-10-CM | POA: Diagnosis not present

## 2014-10-06 DIAGNOSIS — G35 Multiple sclerosis: Secondary | ICD-10-CM | POA: Diagnosis not present

## 2014-10-06 DIAGNOSIS — I10 Essential (primary) hypertension: Secondary | ICD-10-CM | POA: Diagnosis not present

## 2014-10-06 DIAGNOSIS — T82514D Breakdown (mechanical) of infusion catheter, subsequent encounter: Secondary | ICD-10-CM | POA: Diagnosis not present

## 2014-10-07 ENCOUNTER — Ambulatory Visit (INDEPENDENT_AMBULATORY_CARE_PROVIDER_SITE_OTHER): Payer: Medicare Other | Admitting: Pharmacist Clinician (PhC)/ Clinical Pharmacy Specialist

## 2014-10-07 DIAGNOSIS — I4891 Unspecified atrial fibrillation: Secondary | ICD-10-CM

## 2014-10-07 DIAGNOSIS — G35 Multiple sclerosis: Secondary | ICD-10-CM | POA: Diagnosis not present

## 2014-10-07 DIAGNOSIS — T82514D Breakdown (mechanical) of infusion catheter, subsequent encounter: Secondary | ICD-10-CM | POA: Diagnosis not present

## 2014-10-07 DIAGNOSIS — I10 Essential (primary) hypertension: Secondary | ICD-10-CM | POA: Diagnosis not present

## 2014-10-07 DIAGNOSIS — R269 Unspecified abnormalities of gait and mobility: Secondary | ICD-10-CM | POA: Diagnosis not present

## 2014-10-07 DIAGNOSIS — M6281 Muscle weakness (generalized): Secondary | ICD-10-CM | POA: Diagnosis not present

## 2014-10-07 LAB — POCT INR: INR: 2.2

## 2014-10-08 DIAGNOSIS — M6281 Muscle weakness (generalized): Secondary | ICD-10-CM | POA: Diagnosis not present

## 2014-10-08 DIAGNOSIS — I4891 Unspecified atrial fibrillation: Secondary | ICD-10-CM | POA: Diagnosis not present

## 2014-10-08 DIAGNOSIS — G35 Multiple sclerosis: Secondary | ICD-10-CM | POA: Diagnosis not present

## 2014-10-08 DIAGNOSIS — T82514D Breakdown (mechanical) of infusion catheter, subsequent encounter: Secondary | ICD-10-CM | POA: Diagnosis not present

## 2014-10-08 DIAGNOSIS — I10 Essential (primary) hypertension: Secondary | ICD-10-CM | POA: Diagnosis not present

## 2014-10-08 DIAGNOSIS — R269 Unspecified abnormalities of gait and mobility: Secondary | ICD-10-CM | POA: Diagnosis not present

## 2014-10-12 DIAGNOSIS — K592 Neurogenic bowel, not elsewhere classified: Secondary | ICD-10-CM | POA: Diagnosis not present

## 2014-10-12 DIAGNOSIS — N319 Neuromuscular dysfunction of bladder, unspecified: Secondary | ICD-10-CM | POA: Diagnosis not present

## 2014-10-12 DIAGNOSIS — E785 Hyperlipidemia, unspecified: Secondary | ICD-10-CM | POA: Diagnosis not present

## 2014-10-12 DIAGNOSIS — E039 Hypothyroidism, unspecified: Secondary | ICD-10-CM | POA: Diagnosis not present

## 2014-10-12 DIAGNOSIS — Z7901 Long term (current) use of anticoagulants: Secondary | ICD-10-CM | POA: Diagnosis not present

## 2014-10-12 DIAGNOSIS — I10 Essential (primary) hypertension: Secondary | ICD-10-CM | POA: Diagnosis not present

## 2014-10-12 DIAGNOSIS — G35 Multiple sclerosis: Secondary | ICD-10-CM | POA: Diagnosis not present

## 2014-10-12 DIAGNOSIS — I4891 Unspecified atrial fibrillation: Secondary | ICD-10-CM | POA: Diagnosis not present

## 2014-10-13 ENCOUNTER — Telehealth: Payer: Self-pay | Admitting: *Deleted

## 2014-10-13 DIAGNOSIS — I4891 Unspecified atrial fibrillation: Secondary | ICD-10-CM | POA: Diagnosis not present

## 2014-10-13 DIAGNOSIS — I10 Essential (primary) hypertension: Secondary | ICD-10-CM | POA: Diagnosis not present

## 2014-10-13 DIAGNOSIS — G35 Multiple sclerosis: Secondary | ICD-10-CM | POA: Diagnosis not present

## 2014-10-13 DIAGNOSIS — E039 Hypothyroidism, unspecified: Secondary | ICD-10-CM | POA: Diagnosis not present

## 2014-10-13 DIAGNOSIS — K592 Neurogenic bowel, not elsewhere classified: Secondary | ICD-10-CM | POA: Diagnosis not present

## 2014-10-13 DIAGNOSIS — N319 Neuromuscular dysfunction of bladder, unspecified: Secondary | ICD-10-CM | POA: Diagnosis not present

## 2014-10-13 NOTE — Telephone Encounter (Signed)
Patient believes she is having trouble with her MS. Patient is having issues with standing, walking and blurred vision. Please call patient and advise.

## 2014-10-14 NOTE — Telephone Encounter (Signed)
I recommended that Mrs. Chauncey comes in tomorrow at the end of the day to see Dr. [name]. I briefly spoke to him and he thinks that indeed the baclofen dose may be too high right now for the patient explaining her buckling knees and her complaint of diplopia. If this reduction and baclofen would not help her symptoms I would ask her to start the last doses of ACTHAR,  that she still has available at this point. Toni [name] M.D. the patient was understanding

## 2014-10-15 ENCOUNTER — Encounter: Payer: Self-pay | Admitting: Neurology

## 2014-10-15 ENCOUNTER — Ambulatory Visit (INDEPENDENT_AMBULATORY_CARE_PROVIDER_SITE_OTHER): Payer: Medicare Other | Admitting: Neurology

## 2014-10-15 VITALS — BP 121/81 | HR 77

## 2014-10-15 DIAGNOSIS — K592 Neurogenic bowel, not elsewhere classified: Secondary | ICD-10-CM | POA: Diagnosis not present

## 2014-10-15 DIAGNOSIS — E039 Hypothyroidism, unspecified: Secondary | ICD-10-CM | POA: Diagnosis not present

## 2014-10-15 DIAGNOSIS — I4891 Unspecified atrial fibrillation: Secondary | ICD-10-CM | POA: Diagnosis not present

## 2014-10-15 DIAGNOSIS — G35 Multiple sclerosis: Secondary | ICD-10-CM | POA: Diagnosis not present

## 2014-10-15 DIAGNOSIS — I10 Essential (primary) hypertension: Secondary | ICD-10-CM | POA: Diagnosis not present

## 2014-10-15 DIAGNOSIS — N319 Neuromuscular dysfunction of bladder, unspecified: Secondary | ICD-10-CM | POA: Diagnosis not present

## 2014-10-15 NOTE — Procedures (Signed)
      History:  Toni [name] is a 69 year old patient with a history of multiple sclerosis with a paraparesis. The patient has noted over the last several days or weeks that she has developed some increasing weakness of the lower extremities, with a tendency to collapse at the knees. She comes in today for a readjustment of the baclofen pump to lower the rate and to improve gait stability.  Baclofen pump refill note  The baclofen pump was not refilled today, only reprogrammed.  The pump was reprogrammed for the following settings: The basal rate was changed from 30 mcg/h to 28 g per hour, this is a 6% decrease in the daily dosage. The dosing between 1 AM and 5 AM was kept at a rate of 24.8 g per hour or a total of 123.9 g during this dosing time.  The alarm volume is set at 1.5 mL. The next alarm date is 11/15/14.  The patient tolerated the procedure well. There were no complications of the above procedure.

## 2014-10-15 NOTE — Progress Notes (Signed)
Please refer to baclofen pump procedure note. 

## 2014-10-16 ENCOUNTER — Telehealth: Payer: Self-pay | Admitting: Neurology

## 2014-10-16 NOTE — Telephone Encounter (Signed)
FYI-Patient is calling to a advise that she has had some improvement from having pump adjusted but not a lot. Patient is now having muscle spasms. A returned call is not needed.

## 2014-10-16 NOTE — Telephone Encounter (Signed)
FYI-Patient is calling to report that she has not had much improvement with the pump adjustment. Patient will start Acthar injections today and will take 3. Patient will call back on Monday to report. A returned call is not necessary.

## 2014-10-16 NOTE — Telephone Encounter (Signed)
Events noted, it may take several days to get the full effect some the pump change. If the patient needs another adjustment neck suite, she is to contact us. I did not call the patient.

## 2014-10-19 ENCOUNTER — Telehealth: Payer: Self-pay | Admitting: Neurology

## 2014-10-19 ENCOUNTER — Other Ambulatory Visit: Payer: Self-pay | Admitting: *Deleted

## 2014-10-19 DIAGNOSIS — G35 Multiple sclerosis: Secondary | ICD-10-CM

## 2014-10-19 DIAGNOSIS — K592 Neurogenic bowel, not elsewhere classified: Secondary | ICD-10-CM | POA: Diagnosis not present

## 2014-10-19 DIAGNOSIS — I10 Essential (primary) hypertension: Secondary | ICD-10-CM | POA: Diagnosis not present

## 2014-10-19 DIAGNOSIS — I4891 Unspecified atrial fibrillation: Secondary | ICD-10-CM | POA: Diagnosis not present

## 2014-10-19 DIAGNOSIS — N319 Neuromuscular dysfunction of bladder, unspecified: Secondary | ICD-10-CM | POA: Diagnosis not present

## 2014-10-19 DIAGNOSIS — E039 Hypothyroidism, unspecified: Secondary | ICD-10-CM | POA: Diagnosis not present

## 2014-10-19 NOTE — Telephone Encounter (Signed)
I placed order for [facility] aide with [facility].

## 2014-10-19 NOTE — Telephone Encounter (Signed)
Patient requesting order to [facility] for Home Health aid.  Please call and advise.

## 2014-10-19 NOTE — Telephone Encounter (Signed)
Spoke to patient and relayed doctors comment about several days to get full effect of pump change.  Patient said she was doing better and will call if she needs another adjustment.

## 2014-10-19 NOTE — Telephone Encounter (Addendum)
Noted. The patient will call me if she needs another pump adjustment.

## 2014-10-19 NOTE — Progress Notes (Signed)
Placed order for [facility] aide.

## 2014-10-20 NOTE — Telephone Encounter (Signed)
I called and spoke to pt and let her know that referral in place.  (sent via EPIC).  Pt verbalized understanding.   She has used [facility] aide before with [facility].

## 2014-10-21 ENCOUNTER — Telehealth: Payer: Self-pay | Admitting: Neurology

## 2014-10-21 DIAGNOSIS — N319 Neuromuscular dysfunction of bladder, unspecified: Secondary | ICD-10-CM | POA: Diagnosis not present

## 2014-10-21 DIAGNOSIS — I10 Essential (primary) hypertension: Secondary | ICD-10-CM | POA: Diagnosis not present

## 2014-10-21 DIAGNOSIS — K592 Neurogenic bowel, not elsewhere classified: Secondary | ICD-10-CM | POA: Diagnosis not present

## 2014-10-21 DIAGNOSIS — I4891 Unspecified atrial fibrillation: Secondary | ICD-10-CM | POA: Diagnosis not present

## 2014-10-21 DIAGNOSIS — E039 Hypothyroidism, unspecified: Secondary | ICD-10-CM | POA: Diagnosis not present

## 2014-10-21 DIAGNOSIS — G35 Multiple sclerosis: Secondary | ICD-10-CM | POA: Diagnosis not present

## 2014-10-21 NOTE — Telephone Encounter (Signed)
Spoke to patient and relayed that Dr. [name] will be out of town, so she will wait until Monday.  She has enough oral Baclofen to get her through the weekend.

## 2014-10-21 NOTE — Telephone Encounter (Signed)
Patient is calling because she wants to know if she can come in tomorrow at the end of the day to have an adjustment to her baclofen pump with Dr. [name]. Please call and advise. Thank you.

## 2014-10-21 NOTE — Telephone Encounter (Signed)
Please advise 

## 2014-10-22 ENCOUNTER — Telehealth: Payer: Self-pay | Admitting: Neurology

## 2014-10-22 DIAGNOSIS — N319 Neuromuscular dysfunction of bladder, unspecified: Secondary | ICD-10-CM | POA: Diagnosis not present

## 2014-10-22 DIAGNOSIS — E039 Hypothyroidism, unspecified: Secondary | ICD-10-CM | POA: Diagnosis not present

## 2014-10-22 DIAGNOSIS — I4891 Unspecified atrial fibrillation: Secondary | ICD-10-CM | POA: Diagnosis not present

## 2014-10-22 DIAGNOSIS — I10 Essential (primary) hypertension: Secondary | ICD-10-CM | POA: Diagnosis not present

## 2014-10-22 DIAGNOSIS — K592 Neurogenic bowel, not elsewhere classified: Secondary | ICD-10-CM | POA: Diagnosis not present

## 2014-10-22 DIAGNOSIS — G35 Multiple sclerosis: Secondary | ICD-10-CM | POA: Diagnosis not present

## 2014-10-22 NOTE — Telephone Encounter (Signed)
I spoke to pt.  Since no improvement on baclofen pump adjustment, pt took acthar inj for 3 days. (Fri Sat , Sun) and did have improvement, but now feels since Tues of this week to today, has regressed.   PT who is with her now has noticed difference.  She only has 2 inj left, and can not afford anymore since cannot get financial assistance as the foundation does not have enough money.  (copay $1000.00 for 5 injections).  She is experiencing legs wobbly, knees buckle).  Please advise.

## 2014-10-22 NOTE — Telephone Encounter (Signed)
Patient stated she took three injections of Rx corticotropin (ATHACAR H.P.) 80 UNIT/ML injectable gel and felt much better, that was three days ago and feel she maybe having MS exacerbation.  She's experiencing legs feeling wobbly, speech difficulty, and very weak.  Please call and advise.

## 2014-10-23 DIAGNOSIS — I10 Essential (primary) hypertension: Secondary | ICD-10-CM | POA: Diagnosis not present

## 2014-10-23 DIAGNOSIS — G35 Multiple sclerosis: Secondary | ICD-10-CM | POA: Diagnosis not present

## 2014-10-23 DIAGNOSIS — K592 Neurogenic bowel, not elsewhere classified: Secondary | ICD-10-CM | POA: Diagnosis not present

## 2014-10-23 DIAGNOSIS — E039 Hypothyroidism, unspecified: Secondary | ICD-10-CM | POA: Diagnosis not present

## 2014-10-23 DIAGNOSIS — N319 Neuromuscular dysfunction of bladder, unspecified: Secondary | ICD-10-CM | POA: Diagnosis not present

## 2014-10-23 DIAGNOSIS — I4891 Unspecified atrial fibrillation: Secondary | ICD-10-CM | POA: Diagnosis not present

## 2014-10-26 ENCOUNTER — Ambulatory Visit (INDEPENDENT_AMBULATORY_CARE_PROVIDER_SITE_OTHER): Payer: Medicare Other | Admitting: Pharmacist Clinician (PhC)/ Clinical Pharmacy Specialist

## 2014-10-26 DIAGNOSIS — Z7901 Long term (current) use of anticoagulants: Secondary | ICD-10-CM | POA: Diagnosis not present

## 2014-10-26 DIAGNOSIS — I4891 Unspecified atrial fibrillation: Secondary | ICD-10-CM | POA: Diagnosis not present

## 2014-10-26 LAB — POCT INR: INR: 2.6

## 2014-10-26 NOTE — Telephone Encounter (Signed)
I have contacted ins and was able to get a prior auth approved effective until 08/21/2015 Ref # [id number].  The ins, however, does not provide [name] with specific coverage info, so I am unsure what the patient's co-pay will be.  She will have to contact the pharmacy regarding her co-pay, and if assistance is still needed, unfortunately, the only option would be contacting Acthar so she can be screened for any programs they offer.  I called the patient back.  Got no answer.

## 2014-10-26 NOTE — Telephone Encounter (Signed)
i will contact jessice to see what can be done, She needs the Acthar.

## 2014-10-27 DIAGNOSIS — N319 Neuromuscular dysfunction of bladder, unspecified: Secondary | ICD-10-CM | POA: Diagnosis not present

## 2014-10-27 DIAGNOSIS — I4891 Unspecified atrial fibrillation: Secondary | ICD-10-CM | POA: Diagnosis not present

## 2014-10-27 DIAGNOSIS — I10 Essential (primary) hypertension: Secondary | ICD-10-CM | POA: Diagnosis not present

## 2014-10-27 DIAGNOSIS — G35 Multiple sclerosis: Secondary | ICD-10-CM | POA: Diagnosis not present

## 2014-10-27 DIAGNOSIS — K592 Neurogenic bowel, not elsewhere classified: Secondary | ICD-10-CM | POA: Diagnosis not present

## 2014-10-27 DIAGNOSIS — E039 Hypothyroidism, unspecified: Secondary | ICD-10-CM | POA: Diagnosis not present

## 2014-10-27 NOTE — Telephone Encounter (Signed)
I called again.  Spoke with the patient.  She is aware PA has been approved and is contacting [employer] regarding alternate co-pay programs offered.  Says she will call us back if anything further is needed.

## 2014-10-28 DIAGNOSIS — K592 Neurogenic bowel, not elsewhere classified: Secondary | ICD-10-CM | POA: Diagnosis not present

## 2014-10-28 DIAGNOSIS — G35 Multiple sclerosis: Secondary | ICD-10-CM | POA: Diagnosis not present

## 2014-10-28 DIAGNOSIS — I4891 Unspecified atrial fibrillation: Secondary | ICD-10-CM | POA: Diagnosis not present

## 2014-10-28 DIAGNOSIS — E039 Hypothyroidism, unspecified: Secondary | ICD-10-CM | POA: Diagnosis not present

## 2014-10-28 DIAGNOSIS — N319 Neuromuscular dysfunction of bladder, unspecified: Secondary | ICD-10-CM | POA: Diagnosis not present

## 2014-10-28 DIAGNOSIS — I10 Essential (primary) hypertension: Secondary | ICD-10-CM | POA: Diagnosis not present

## 2014-10-29 DIAGNOSIS — G35 Multiple sclerosis: Secondary | ICD-10-CM | POA: Diagnosis not present

## 2014-10-29 DIAGNOSIS — N319 Neuromuscular dysfunction of bladder, unspecified: Secondary | ICD-10-CM | POA: Diagnosis not present

## 2014-10-29 DIAGNOSIS — I10 Essential (primary) hypertension: Secondary | ICD-10-CM | POA: Diagnosis not present

## 2014-10-29 DIAGNOSIS — E039 Hypothyroidism, unspecified: Secondary | ICD-10-CM | POA: Diagnosis not present

## 2014-10-29 DIAGNOSIS — K592 Neurogenic bowel, not elsewhere classified: Secondary | ICD-10-CM | POA: Diagnosis not present

## 2014-10-29 DIAGNOSIS — I4891 Unspecified atrial fibrillation: Secondary | ICD-10-CM | POA: Diagnosis not present

## 2014-10-30 DIAGNOSIS — K592 Neurogenic bowel, not elsewhere classified: Secondary | ICD-10-CM | POA: Diagnosis not present

## 2014-10-30 DIAGNOSIS — I4891 Unspecified atrial fibrillation: Secondary | ICD-10-CM | POA: Diagnosis not present

## 2014-10-30 DIAGNOSIS — N319 Neuromuscular dysfunction of bladder, unspecified: Secondary | ICD-10-CM | POA: Diagnosis not present

## 2014-10-30 DIAGNOSIS — G35 Multiple sclerosis: Secondary | ICD-10-CM | POA: Diagnosis not present

## 2014-10-30 DIAGNOSIS — E039 Hypothyroidism, unspecified: Secondary | ICD-10-CM | POA: Diagnosis not present

## 2014-10-30 DIAGNOSIS — I10 Essential (primary) hypertension: Secondary | ICD-10-CM | POA: Diagnosis not present

## 2014-11-02 DIAGNOSIS — E039 Hypothyroidism, unspecified: Secondary | ICD-10-CM | POA: Diagnosis not present

## 2014-11-02 DIAGNOSIS — N319 Neuromuscular dysfunction of bladder, unspecified: Secondary | ICD-10-CM | POA: Diagnosis not present

## 2014-11-02 DIAGNOSIS — G35 Multiple sclerosis: Secondary | ICD-10-CM | POA: Diagnosis not present

## 2014-11-02 DIAGNOSIS — I10 Essential (primary) hypertension: Secondary | ICD-10-CM | POA: Diagnosis not present

## 2014-11-02 DIAGNOSIS — I4891 Unspecified atrial fibrillation: Secondary | ICD-10-CM | POA: Diagnosis not present

## 2014-11-02 DIAGNOSIS — K592 Neurogenic bowel, not elsewhere classified: Secondary | ICD-10-CM | POA: Diagnosis not present

## 2014-11-03 DIAGNOSIS — E039 Hypothyroidism, unspecified: Secondary | ICD-10-CM | POA: Diagnosis not present

## 2014-11-03 DIAGNOSIS — I4891 Unspecified atrial fibrillation: Secondary | ICD-10-CM | POA: Diagnosis not present

## 2014-11-03 DIAGNOSIS — N319 Neuromuscular dysfunction of bladder, unspecified: Secondary | ICD-10-CM | POA: Diagnosis not present

## 2014-11-03 DIAGNOSIS — G35 Multiple sclerosis: Secondary | ICD-10-CM | POA: Diagnosis not present

## 2014-11-03 DIAGNOSIS — K592 Neurogenic bowel, not elsewhere classified: Secondary | ICD-10-CM | POA: Diagnosis not present

## 2014-11-03 DIAGNOSIS — I10 Essential (primary) hypertension: Secondary | ICD-10-CM | POA: Diagnosis not present

## 2014-11-05 DIAGNOSIS — G35 Multiple sclerosis: Secondary | ICD-10-CM | POA: Diagnosis not present

## 2014-11-05 DIAGNOSIS — I10 Essential (primary) hypertension: Secondary | ICD-10-CM | POA: Diagnosis not present

## 2014-11-05 DIAGNOSIS — I4891 Unspecified atrial fibrillation: Secondary | ICD-10-CM | POA: Diagnosis not present

## 2014-11-05 DIAGNOSIS — E039 Hypothyroidism, unspecified: Secondary | ICD-10-CM | POA: Diagnosis not present

## 2014-11-05 DIAGNOSIS — K592 Neurogenic bowel, not elsewhere classified: Secondary | ICD-10-CM | POA: Diagnosis not present

## 2014-11-05 DIAGNOSIS — N319 Neuromuscular dysfunction of bladder, unspecified: Secondary | ICD-10-CM | POA: Diagnosis not present

## 2014-11-09 DIAGNOSIS — I4891 Unspecified atrial fibrillation: Secondary | ICD-10-CM | POA: Diagnosis not present

## 2014-11-09 DIAGNOSIS — G35 Multiple sclerosis: Secondary | ICD-10-CM | POA: Diagnosis not present

## 2014-11-09 DIAGNOSIS — N319 Neuromuscular dysfunction of bladder, unspecified: Secondary | ICD-10-CM | POA: Diagnosis not present

## 2014-11-09 DIAGNOSIS — I10 Essential (primary) hypertension: Secondary | ICD-10-CM | POA: Diagnosis not present

## 2014-11-09 DIAGNOSIS — E039 Hypothyroidism, unspecified: Secondary | ICD-10-CM | POA: Diagnosis not present

## 2014-11-09 DIAGNOSIS — K592 Neurogenic bowel, not elsewhere classified: Secondary | ICD-10-CM | POA: Diagnosis not present

## 2014-11-10 ENCOUNTER — Telehealth: Payer: Self-pay | Admitting: *Deleted

## 2014-11-10 DIAGNOSIS — I4891 Unspecified atrial fibrillation: Secondary | ICD-10-CM | POA: Diagnosis not present

## 2014-11-10 DIAGNOSIS — I10 Essential (primary) hypertension: Secondary | ICD-10-CM | POA: Diagnosis not present

## 2014-11-10 DIAGNOSIS — N319 Neuromuscular dysfunction of bladder, unspecified: Secondary | ICD-10-CM | POA: Diagnosis not present

## 2014-11-10 DIAGNOSIS — E039 Hypothyroidism, unspecified: Secondary | ICD-10-CM | POA: Diagnosis not present

## 2014-11-10 DIAGNOSIS — K592 Neurogenic bowel, not elsewhere classified: Secondary | ICD-10-CM | POA: Diagnosis not present

## 2014-11-10 DIAGNOSIS — G35 Multiple sclerosis: Secondary | ICD-10-CM | POA: Diagnosis not present

## 2014-11-10 NOTE — Telephone Encounter (Signed)
Pt called me and relayed that she has gotten funding for Acthar for the year.  She is to get one vial for this month and then has one refill.   Will need refills for the other 10 months.  She also would like to get samples as well.  She has baclofen pump refill scheduled 11-12-14 with Dr. [name].

## 2014-11-10 NOTE — Telephone Encounter (Signed)
I called and LM with husband about having pt calling back about medication Acthar.

## 2014-11-10 NOTE — Telephone Encounter (Signed)
I called and could not LMVM due to internet problems.

## 2014-11-11 NOTE — Telephone Encounter (Signed)
Per Dr. Brett Fairy, sample of one vial can be given to pt.

## 2014-11-11 NOTE — Telephone Encounter (Signed)
Let her husband pick up the samples, 5 injections once a month. CD

## 2014-11-12 ENCOUNTER — Encounter: Payer: Self-pay | Admitting: Neurology

## 2014-11-12 ENCOUNTER — Ambulatory Visit (INDEPENDENT_AMBULATORY_CARE_PROVIDER_SITE_OTHER): Payer: Medicare Other | Admitting: Neurology

## 2014-11-12 VITALS — BP 128/82 | HR 60

## 2014-11-12 DIAGNOSIS — K592 Neurogenic bowel, not elsewhere classified: Secondary | ICD-10-CM | POA: Diagnosis not present

## 2014-11-12 DIAGNOSIS — M62838 Other muscle spasm: Secondary | ICD-10-CM

## 2014-11-12 DIAGNOSIS — M6249 Contracture of muscle, multiple sites: Secondary | ICD-10-CM

## 2014-11-12 DIAGNOSIS — G35 Multiple sclerosis: Secondary | ICD-10-CM

## 2014-11-12 DIAGNOSIS — E039 Hypothyroidism, unspecified: Secondary | ICD-10-CM | POA: Diagnosis not present

## 2014-11-12 DIAGNOSIS — N319 Neuromuscular dysfunction of bladder, unspecified: Secondary | ICD-10-CM | POA: Diagnosis not present

## 2014-11-12 DIAGNOSIS — I10 Essential (primary) hypertension: Secondary | ICD-10-CM | POA: Diagnosis not present

## 2014-11-12 DIAGNOSIS — I4891 Unspecified atrial fibrillation: Secondary | ICD-10-CM | POA: Diagnosis not present

## 2014-11-12 MED ORDER — BACLOFEN 40000 MCG/20ML IT SOLN
80000.0000 ug | Freq: Once | INTRATHECAL | Status: AC
  Administered 2014-11-12: 80000 ug via INTRATHECAL

## 2014-11-12 NOTE — Procedures (Signed)
     History:  Toni [name] is a 69 year old patient with a history of multiple sclerosis with a paraparesis. The patient has a baclofen pump in place, she returns to the office today for a baclofen pump refill. She indicates that she is satisfied with her current pump settings, no significant problems with spasticity in the legs.  Baclofen pump refill note  The baclofen pump site was cleaned with Betadine solution. A 21-gauge needle was inserted into the pump port site. Approximately 4 cc of residual baclofen was removed, the baclofen pump indicates that 3.2 mL are present in the reservoir. 40 cc of replacement baclofen was placed into the pump at 2000 mcg/cc concentration.  The pump was reprogrammed for the following settings: The pump with maintain at a basal rate of 28 g per hour. Between the hours of 1 AM and 5 AM, the patient has a reduction in the rate to 24.8 g per hour. She receives 123.9 g during this period of time, with a total of 655.7 g a day.  The alarm volume is set at 1.5 mL. The next alarm date is 03/09/2015.  The patient tolerated the procedure well. There were no complications of the above procedure.  The [place] number is [phone] The baclofen expiration date is April 2018. The baclofen lot number is [id number].

## 2014-11-12 NOTE — Progress Notes (Signed)
Please refer to baclofen pump refill procedure note. 

## 2014-11-13 DIAGNOSIS — I4891 Unspecified atrial fibrillation: Secondary | ICD-10-CM | POA: Diagnosis not present

## 2014-11-13 DIAGNOSIS — K592 Neurogenic bowel, not elsewhere classified: Secondary | ICD-10-CM | POA: Diagnosis not present

## 2014-11-13 DIAGNOSIS — I10 Essential (primary) hypertension: Secondary | ICD-10-CM | POA: Diagnosis not present

## 2014-11-13 DIAGNOSIS — N319 Neuromuscular dysfunction of bladder, unspecified: Secondary | ICD-10-CM | POA: Diagnosis not present

## 2014-11-13 DIAGNOSIS — E039 Hypothyroidism, unspecified: Secondary | ICD-10-CM | POA: Diagnosis not present

## 2014-11-13 DIAGNOSIS — G35 Multiple sclerosis: Secondary | ICD-10-CM | POA: Diagnosis not present

## 2014-11-16 ENCOUNTER — Telehealth: Payer: Self-pay | Admitting: Internal Medicine

## 2014-11-16 DIAGNOSIS — I4891 Unspecified atrial fibrillation: Secondary | ICD-10-CM | POA: Diagnosis not present

## 2014-11-16 DIAGNOSIS — E039 Hypothyroidism, unspecified: Secondary | ICD-10-CM | POA: Diagnosis not present

## 2014-11-16 DIAGNOSIS — I10 Essential (primary) hypertension: Secondary | ICD-10-CM | POA: Diagnosis not present

## 2014-11-16 DIAGNOSIS — G35 Multiple sclerosis: Secondary | ICD-10-CM | POA: Diagnosis not present

## 2014-11-16 DIAGNOSIS — K592 Neurogenic bowel, not elsewhere classified: Secondary | ICD-10-CM | POA: Diagnosis not present

## 2014-11-16 DIAGNOSIS — N319 Neuromuscular dysfunction of bladder, unspecified: Secondary | ICD-10-CM | POA: Diagnosis not present

## 2014-11-16 MED ORDER — FUROSEMIDE 40 MG PO TABS: ORAL_TABLET | ORAL | Status: DC

## 2014-11-16 NOTE — Telephone Encounter (Signed)
Patient notified

## 2014-11-16 NOTE — Telephone Encounter (Signed)
Rx(s) sent to pharmacy electronically.  

## 2014-11-16 NOTE — Telephone Encounter (Signed)
°  1. Which medications need to be refilled? Furosemide   2. Which pharmacy is medication to be sent to?[facility]  3. Do they need a 30 day or 90 day supply? 90  4. Would they like a call back once the medication has been sent to the pharmacy? yes

## 2014-11-17 DIAGNOSIS — I10 Essential (primary) hypertension: Secondary | ICD-10-CM | POA: Diagnosis not present

## 2014-11-17 DIAGNOSIS — N319 Neuromuscular dysfunction of bladder, unspecified: Secondary | ICD-10-CM | POA: Diagnosis not present

## 2014-11-17 DIAGNOSIS — G35 Multiple sclerosis: Secondary | ICD-10-CM | POA: Diagnosis not present

## 2014-11-17 DIAGNOSIS — I4891 Unspecified atrial fibrillation: Secondary | ICD-10-CM | POA: Diagnosis not present

## 2014-11-17 DIAGNOSIS — K592 Neurogenic bowel, not elsewhere classified: Secondary | ICD-10-CM | POA: Diagnosis not present

## 2014-11-17 DIAGNOSIS — E039 Hypothyroidism, unspecified: Secondary | ICD-10-CM | POA: Diagnosis not present

## 2014-11-18 DIAGNOSIS — E039 Hypothyroidism, unspecified: Secondary | ICD-10-CM | POA: Diagnosis not present

## 2014-11-18 DIAGNOSIS — N319 Neuromuscular dysfunction of bladder, unspecified: Secondary | ICD-10-CM | POA: Diagnosis not present

## 2014-11-18 DIAGNOSIS — K592 Neurogenic bowel, not elsewhere classified: Secondary | ICD-10-CM | POA: Diagnosis not present

## 2014-11-18 DIAGNOSIS — G35 Multiple sclerosis: Secondary | ICD-10-CM | POA: Diagnosis not present

## 2014-11-18 DIAGNOSIS — I4891 Unspecified atrial fibrillation: Secondary | ICD-10-CM | POA: Diagnosis not present

## 2014-11-18 DIAGNOSIS — I10 Essential (primary) hypertension: Secondary | ICD-10-CM | POA: Diagnosis not present

## 2014-11-19 DIAGNOSIS — N319 Neuromuscular dysfunction of bladder, unspecified: Secondary | ICD-10-CM | POA: Diagnosis not present

## 2014-11-19 DIAGNOSIS — I10 Essential (primary) hypertension: Secondary | ICD-10-CM | POA: Diagnosis not present

## 2014-11-19 DIAGNOSIS — K592 Neurogenic bowel, not elsewhere classified: Secondary | ICD-10-CM | POA: Diagnosis not present

## 2014-11-19 DIAGNOSIS — I4891 Unspecified atrial fibrillation: Secondary | ICD-10-CM | POA: Diagnosis not present

## 2014-11-19 DIAGNOSIS — G35 Multiple sclerosis: Secondary | ICD-10-CM | POA: Diagnosis not present

## 2014-11-19 DIAGNOSIS — E039 Hypothyroidism, unspecified: Secondary | ICD-10-CM | POA: Diagnosis not present

## 2014-11-23 ENCOUNTER — Ambulatory Visit (INDEPENDENT_AMBULATORY_CARE_PROVIDER_SITE_OTHER): Payer: Medicare Other | Admitting: Pharmacist Clinician (PhC)/ Clinical Pharmacy Specialist

## 2014-11-23 DIAGNOSIS — Z7901 Long term (current) use of anticoagulants: Secondary | ICD-10-CM

## 2014-11-23 DIAGNOSIS — I4891 Unspecified atrial fibrillation: Secondary | ICD-10-CM | POA: Diagnosis not present

## 2014-11-23 LAB — POCT INR: INR: 2.1

## 2014-11-24 ENCOUNTER — Other Ambulatory Visit: Payer: Self-pay

## 2014-11-24 DIAGNOSIS — G35 Multiple sclerosis: Secondary | ICD-10-CM | POA: Diagnosis not present

## 2014-11-24 DIAGNOSIS — I10 Essential (primary) hypertension: Secondary | ICD-10-CM | POA: Diagnosis not present

## 2014-11-24 DIAGNOSIS — E039 Hypothyroidism, unspecified: Secondary | ICD-10-CM | POA: Diagnosis not present

## 2014-11-24 DIAGNOSIS — Z1231 Encounter for screening mammogram for malignant neoplasm of breast: Secondary | ICD-10-CM

## 2014-11-24 DIAGNOSIS — I4891 Unspecified atrial fibrillation: Secondary | ICD-10-CM | POA: Diagnosis not present

## 2014-11-24 DIAGNOSIS — N319 Neuromuscular dysfunction of bladder, unspecified: Secondary | ICD-10-CM | POA: Diagnosis not present

## 2014-11-24 DIAGNOSIS — K592 Neurogenic bowel, not elsewhere classified: Secondary | ICD-10-CM | POA: Diagnosis not present

## 2014-11-25 DIAGNOSIS — I4891 Unspecified atrial fibrillation: Secondary | ICD-10-CM | POA: Diagnosis not present

## 2014-11-25 DIAGNOSIS — E039 Hypothyroidism, unspecified: Secondary | ICD-10-CM | POA: Diagnosis not present

## 2014-11-25 DIAGNOSIS — K592 Neurogenic bowel, not elsewhere classified: Secondary | ICD-10-CM | POA: Diagnosis not present

## 2014-11-25 DIAGNOSIS — N319 Neuromuscular dysfunction of bladder, unspecified: Secondary | ICD-10-CM | POA: Diagnosis not present

## 2014-11-25 DIAGNOSIS — I10 Essential (primary) hypertension: Secondary | ICD-10-CM | POA: Diagnosis not present

## 2014-11-25 DIAGNOSIS — G35 Multiple sclerosis: Secondary | ICD-10-CM | POA: Diagnosis not present

## 2014-11-26 DIAGNOSIS — I4891 Unspecified atrial fibrillation: Secondary | ICD-10-CM | POA: Diagnosis not present

## 2014-11-26 DIAGNOSIS — N319 Neuromuscular dysfunction of bladder, unspecified: Secondary | ICD-10-CM | POA: Diagnosis not present

## 2014-11-26 DIAGNOSIS — I10 Essential (primary) hypertension: Secondary | ICD-10-CM | POA: Diagnosis not present

## 2014-11-26 DIAGNOSIS — E039 Hypothyroidism, unspecified: Secondary | ICD-10-CM | POA: Diagnosis not present

## 2014-11-26 DIAGNOSIS — G35 Multiple sclerosis: Secondary | ICD-10-CM | POA: Diagnosis not present

## 2014-11-26 DIAGNOSIS — K592 Neurogenic bowel, not elsewhere classified: Secondary | ICD-10-CM | POA: Diagnosis not present

## 2014-11-27 DIAGNOSIS — E039 Hypothyroidism, unspecified: Secondary | ICD-10-CM | POA: Diagnosis not present

## 2014-11-27 DIAGNOSIS — G35 Multiple sclerosis: Secondary | ICD-10-CM | POA: Diagnosis not present

## 2014-11-27 DIAGNOSIS — K592 Neurogenic bowel, not elsewhere classified: Secondary | ICD-10-CM | POA: Diagnosis not present

## 2014-11-27 DIAGNOSIS — N319 Neuromuscular dysfunction of bladder, unspecified: Secondary | ICD-10-CM | POA: Diagnosis not present

## 2014-11-27 DIAGNOSIS — I10 Essential (primary) hypertension: Secondary | ICD-10-CM | POA: Diagnosis not present

## 2014-11-27 DIAGNOSIS — I4891 Unspecified atrial fibrillation: Secondary | ICD-10-CM | POA: Diagnosis not present

## 2014-11-30 DIAGNOSIS — K592 Neurogenic bowel, not elsewhere classified: Secondary | ICD-10-CM | POA: Diagnosis not present

## 2014-11-30 DIAGNOSIS — I4891 Unspecified atrial fibrillation: Secondary | ICD-10-CM | POA: Diagnosis not present

## 2014-11-30 DIAGNOSIS — N319 Neuromuscular dysfunction of bladder, unspecified: Secondary | ICD-10-CM | POA: Diagnosis not present

## 2014-11-30 DIAGNOSIS — G35 Multiple sclerosis: Secondary | ICD-10-CM | POA: Diagnosis not present

## 2014-11-30 DIAGNOSIS — E039 Hypothyroidism, unspecified: Secondary | ICD-10-CM | POA: Diagnosis not present

## 2014-11-30 DIAGNOSIS — I10 Essential (primary) hypertension: Secondary | ICD-10-CM | POA: Diagnosis not present

## 2014-12-03 DIAGNOSIS — E039 Hypothyroidism, unspecified: Secondary | ICD-10-CM | POA: Diagnosis not present

## 2014-12-03 DIAGNOSIS — K592 Neurogenic bowel, not elsewhere classified: Secondary | ICD-10-CM | POA: Diagnosis not present

## 2014-12-03 DIAGNOSIS — G35 Multiple sclerosis: Secondary | ICD-10-CM | POA: Diagnosis not present

## 2014-12-03 DIAGNOSIS — I10 Essential (primary) hypertension: Secondary | ICD-10-CM | POA: Diagnosis not present

## 2014-12-03 DIAGNOSIS — N319 Neuromuscular dysfunction of bladder, unspecified: Secondary | ICD-10-CM | POA: Diagnosis not present

## 2014-12-03 DIAGNOSIS — I4891 Unspecified atrial fibrillation: Secondary | ICD-10-CM | POA: Diagnosis not present

## 2014-12-04 DIAGNOSIS — N319 Neuromuscular dysfunction of bladder, unspecified: Secondary | ICD-10-CM | POA: Diagnosis not present

## 2014-12-04 DIAGNOSIS — K592 Neurogenic bowel, not elsewhere classified: Secondary | ICD-10-CM | POA: Diagnosis not present

## 2014-12-04 DIAGNOSIS — G35 Multiple sclerosis: Secondary | ICD-10-CM | POA: Diagnosis not present

## 2014-12-04 DIAGNOSIS — E039 Hypothyroidism, unspecified: Secondary | ICD-10-CM | POA: Diagnosis not present

## 2014-12-04 DIAGNOSIS — I4891 Unspecified atrial fibrillation: Secondary | ICD-10-CM | POA: Diagnosis not present

## 2014-12-04 DIAGNOSIS — I10 Essential (primary) hypertension: Secondary | ICD-10-CM | POA: Diagnosis not present

## 2014-12-07 DIAGNOSIS — K592 Neurogenic bowel, not elsewhere classified: Secondary | ICD-10-CM | POA: Diagnosis not present

## 2014-12-07 DIAGNOSIS — I10 Essential (primary) hypertension: Secondary | ICD-10-CM | POA: Diagnosis not present

## 2014-12-07 DIAGNOSIS — I4891 Unspecified atrial fibrillation: Secondary | ICD-10-CM | POA: Diagnosis not present

## 2014-12-07 DIAGNOSIS — N319 Neuromuscular dysfunction of bladder, unspecified: Secondary | ICD-10-CM | POA: Diagnosis not present

## 2014-12-07 DIAGNOSIS — G35 Multiple sclerosis: Secondary | ICD-10-CM | POA: Diagnosis not present

## 2014-12-07 DIAGNOSIS — E039 Hypothyroidism, unspecified: Secondary | ICD-10-CM | POA: Diagnosis not present

## 2014-12-08 DIAGNOSIS — G35 Multiple sclerosis: Secondary | ICD-10-CM | POA: Diagnosis not present

## 2014-12-08 DIAGNOSIS — E039 Hypothyroidism, unspecified: Secondary | ICD-10-CM | POA: Diagnosis not present

## 2014-12-08 DIAGNOSIS — N319 Neuromuscular dysfunction of bladder, unspecified: Secondary | ICD-10-CM | POA: Diagnosis not present

## 2014-12-08 DIAGNOSIS — I4891 Unspecified atrial fibrillation: Secondary | ICD-10-CM | POA: Diagnosis not present

## 2014-12-08 DIAGNOSIS — I10 Essential (primary) hypertension: Secondary | ICD-10-CM | POA: Diagnosis not present

## 2014-12-08 DIAGNOSIS — K592 Neurogenic bowel, not elsewhere classified: Secondary | ICD-10-CM | POA: Diagnosis not present

## 2014-12-10 DIAGNOSIS — R609 Edema, unspecified: Secondary | ICD-10-CM | POA: Diagnosis not present

## 2014-12-10 DIAGNOSIS — G35 Multiple sclerosis: Secondary | ICD-10-CM | POA: Diagnosis not present

## 2014-12-10 DIAGNOSIS — I480 Paroxysmal atrial fibrillation: Secondary | ICD-10-CM | POA: Diagnosis not present

## 2014-12-10 DIAGNOSIS — I4891 Unspecified atrial fibrillation: Secondary | ICD-10-CM | POA: Diagnosis not present

## 2014-12-10 DIAGNOSIS — R7301 Impaired fasting glucose: Secondary | ICD-10-CM | POA: Diagnosis not present

## 2014-12-10 DIAGNOSIS — I10 Essential (primary) hypertension: Secondary | ICD-10-CM | POA: Diagnosis not present

## 2014-12-10 DIAGNOSIS — N319 Neuromuscular dysfunction of bladder, unspecified: Secondary | ICD-10-CM | POA: Diagnosis not present

## 2014-12-10 DIAGNOSIS — E039 Hypothyroidism, unspecified: Secondary | ICD-10-CM | POA: Diagnosis not present

## 2014-12-10 DIAGNOSIS — K592 Neurogenic bowel, not elsewhere classified: Secondary | ICD-10-CM | POA: Diagnosis not present

## 2014-12-11 DIAGNOSIS — N319 Neuromuscular dysfunction of bladder, unspecified: Secondary | ICD-10-CM | POA: Diagnosis not present

## 2014-12-11 DIAGNOSIS — I4891 Unspecified atrial fibrillation: Secondary | ICD-10-CM | POA: Diagnosis not present

## 2014-12-11 DIAGNOSIS — K592 Neurogenic bowel, not elsewhere classified: Secondary | ICD-10-CM | POA: Diagnosis not present

## 2014-12-11 DIAGNOSIS — Z7901 Long term (current) use of anticoagulants: Secondary | ICD-10-CM | POA: Diagnosis not present

## 2014-12-11 DIAGNOSIS — I10 Essential (primary) hypertension: Secondary | ICD-10-CM | POA: Diagnosis not present

## 2014-12-11 DIAGNOSIS — G35 Multiple sclerosis: Secondary | ICD-10-CM | POA: Diagnosis not present

## 2014-12-11 DIAGNOSIS — E039 Hypothyroidism, unspecified: Secondary | ICD-10-CM | POA: Diagnosis not present

## 2014-12-11 DIAGNOSIS — E785 Hyperlipidemia, unspecified: Secondary | ICD-10-CM | POA: Diagnosis not present

## 2014-12-14 DIAGNOSIS — G35 Multiple sclerosis: Secondary | ICD-10-CM | POA: Diagnosis not present

## 2014-12-14 DIAGNOSIS — E039 Hypothyroidism, unspecified: Secondary | ICD-10-CM | POA: Diagnosis not present

## 2014-12-14 DIAGNOSIS — K592 Neurogenic bowel, not elsewhere classified: Secondary | ICD-10-CM | POA: Diagnosis not present

## 2014-12-14 DIAGNOSIS — N319 Neuromuscular dysfunction of bladder, unspecified: Secondary | ICD-10-CM | POA: Diagnosis not present

## 2014-12-14 DIAGNOSIS — I4891 Unspecified atrial fibrillation: Secondary | ICD-10-CM | POA: Diagnosis not present

## 2014-12-14 DIAGNOSIS — I10 Essential (primary) hypertension: Secondary | ICD-10-CM | POA: Diagnosis not present

## 2014-12-15 ENCOUNTER — Telehealth: Payer: Self-pay | Admitting: Internal Medicine

## 2014-12-15 ENCOUNTER — Ambulatory Visit: Payer: Medicare Other | Admitting: Internal Medicine

## 2014-12-15 DIAGNOSIS — N319 Neuromuscular dysfunction of bladder, unspecified: Secondary | ICD-10-CM | POA: Diagnosis not present

## 2014-12-15 DIAGNOSIS — E039 Hypothyroidism, unspecified: Secondary | ICD-10-CM | POA: Diagnosis not present

## 2014-12-15 DIAGNOSIS — K592 Neurogenic bowel, not elsewhere classified: Secondary | ICD-10-CM | POA: Diagnosis not present

## 2014-12-15 DIAGNOSIS — I4891 Unspecified atrial fibrillation: Secondary | ICD-10-CM | POA: Diagnosis not present

## 2014-12-15 DIAGNOSIS — I10 Essential (primary) hypertension: Secondary | ICD-10-CM | POA: Diagnosis not present

## 2014-12-15 DIAGNOSIS — G35 Multiple sclerosis: Secondary | ICD-10-CM | POA: Diagnosis not present

## 2014-12-15 NOTE — Telephone Encounter (Signed)
She has known PAF - I would not recommend and medication changes.  Dr. Lemmie Evens

## 2014-12-15 NOTE — Telephone Encounter (Signed)
Returned call to patient. She voiced understanding w/ Dr. Lysbeth Penner recommendations. Has f/u in 10 days, states she will address any concerns then. She was instructed to call as needed for any questions.

## 2014-12-15 NOTE — Telephone Encounter (Signed)
Toni [name], PT w/ [facility] called reporting pt currently in apparent Afib.   Rate between 77-134. Unknown how recently this set on. Pt reports only symptom as fatigue. Advised to limit pt's Physical Therapy to her comfort.  Informed I would defer to Dr. Debara Pickett for recommendations

## 2014-12-17 ENCOUNTER — Ambulatory Visit: Payer: Medicare Other

## 2014-12-17 DIAGNOSIS — G35 Multiple sclerosis: Secondary | ICD-10-CM | POA: Diagnosis not present

## 2014-12-17 DIAGNOSIS — I10 Essential (primary) hypertension: Secondary | ICD-10-CM | POA: Diagnosis not present

## 2014-12-17 DIAGNOSIS — I4891 Unspecified atrial fibrillation: Secondary | ICD-10-CM | POA: Diagnosis not present

## 2014-12-17 DIAGNOSIS — N319 Neuromuscular dysfunction of bladder, unspecified: Secondary | ICD-10-CM | POA: Diagnosis not present

## 2014-12-17 DIAGNOSIS — K592 Neurogenic bowel, not elsewhere classified: Secondary | ICD-10-CM | POA: Diagnosis not present

## 2014-12-17 DIAGNOSIS — E039 Hypothyroidism, unspecified: Secondary | ICD-10-CM | POA: Diagnosis not present

## 2014-12-18 ENCOUNTER — Other Ambulatory Visit (INDEPENDENT_AMBULATORY_CARE_PROVIDER_SITE_OTHER): Payer: Self-pay

## 2014-12-18 ENCOUNTER — Telehealth: Payer: Self-pay | Admitting: Neurology

## 2014-12-18 ENCOUNTER — Other Ambulatory Visit: Payer: Self-pay | Admitting: Neurology

## 2014-12-18 DIAGNOSIS — Z0289 Encounter for other administrative examinations: Secondary | ICD-10-CM

## 2014-12-18 DIAGNOSIS — R509 Fever, unspecified: Secondary | ICD-10-CM

## 2014-12-18 NOTE — Telephone Encounter (Signed)
The patient called indicating that she is having increased spasticity across the abdomen and lower legs. She is taking oral baclofen. She believes that she is running a low-grade fever, 99. She will come in today for CBC and urinalysis. The baclofen pump was interrogated, appears to be working normally. I did not readjust the rate. The patient may have urinary tract infection as the etiology of her current symptoms.

## 2014-12-19 LAB — CBC WITH DIFFERENTIAL/PLATELET
BASOS: 0 %
Basophils Absolute: 0 10*3/uL (ref 0.0–0.2)
EOS (ABSOLUTE): 0.1 10*3/uL (ref 0.0–0.4)
Eos: 1 %
HEMOGLOBIN: 14.9 g/dL (ref 11.1–15.9)
Hematocrit: 44.6 % (ref 34.0–46.6)
IMMATURE GRANS (ABS): 0 10*3/uL (ref 0.0–0.1)
IMMATURE GRANULOCYTES: 0 %
Lymphocytes Absolute: 2.1 10*3/uL (ref 0.7–3.1)
Lymphs: 29 %
MCH: 29.7 pg (ref 26.6–33.0)
MCHC: 33.4 g/dL (ref 31.5–35.7)
MCV: 89 fL (ref 79–97)
MONOS ABS: 0.8 10*3/uL (ref 0.1–0.9)
Monocytes: 11 %
NEUTROS PCT: 59 %
Neutrophils Absolute: 4.4 10*3/uL (ref 1.4–7.0)
PLATELETS: 219 10*3/uL (ref 150–379)
RBC: 5.01 x10E6/uL (ref 3.77–5.28)
RDW: 13.3 % (ref 12.3–15.4)
WBC: 7.4 10*3/uL (ref 3.4–10.8)

## 2014-12-19 LAB — URINALYSIS, ROUTINE W REFLEX MICROSCOPIC
Bilirubin, UA: NEGATIVE
GLUCOSE, UA: NEGATIVE
Ketones, UA: NEGATIVE
NITRITE UA: NEGATIVE
PH UA: 7 (ref 5.0–7.5)
Protein, UA: NEGATIVE
RBC, UA: NEGATIVE
SPEC GRAV UA: 1.019 (ref 1.005–1.030)
UUROB: 0.2 mg/dL (ref 0.2–1.0)

## 2014-12-19 LAB — MICROSCOPIC EXAMINATION
Bacteria, UA: NONE SEEN
CASTS: NONE SEEN /LPF

## 2014-12-21 ENCOUNTER — Telehealth: Payer: Self-pay

## 2014-12-21 DIAGNOSIS — I10 Essential (primary) hypertension: Secondary | ICD-10-CM | POA: Diagnosis not present

## 2014-12-21 DIAGNOSIS — G35 Multiple sclerosis: Secondary | ICD-10-CM | POA: Diagnosis not present

## 2014-12-21 DIAGNOSIS — K592 Neurogenic bowel, not elsewhere classified: Secondary | ICD-10-CM | POA: Diagnosis not present

## 2014-12-21 DIAGNOSIS — N319 Neuromuscular dysfunction of bladder, unspecified: Secondary | ICD-10-CM | POA: Diagnosis not present

## 2014-12-21 DIAGNOSIS — E039 Hypothyroidism, unspecified: Secondary | ICD-10-CM | POA: Diagnosis not present

## 2014-12-21 DIAGNOSIS — I4891 Unspecified atrial fibrillation: Secondary | ICD-10-CM | POA: Diagnosis not present

## 2014-12-21 NOTE — Telephone Encounter (Signed)
I called patient. The blood work and urinalysis were unremarkable. The patient is getting some better with Acthar, she is to continue another 5 days of treatment for a total of 10 days. She may be having an MS exacerbation. If the spasticity is not improved, we can alter the pump settings.

## 2014-12-21 NOTE — Telephone Encounter (Signed)
-----   Message from Kathrynn Ducking, MD sent at 12/19/2014  5:32 PM EDT ----- Urine and blood work look OK. Please call the patient. ----- Message -----    From: [facility] Lab Results In Interface    Sent: 12/19/2014   5:43 AM      To: Kathrynn Ducking, MD

## 2014-12-21 NOTE — Telephone Encounter (Signed)
Spoke with the patient. Relayed results. She is confused about what could be going on. She states she is having too many MS symptoms and it burns when she voids. She would like to know what the plan will be from here and wants Dr. Brett Fairy to be aware of what is going on.

## 2014-12-22 DIAGNOSIS — I4891 Unspecified atrial fibrillation: Secondary | ICD-10-CM | POA: Diagnosis not present

## 2014-12-22 DIAGNOSIS — K592 Neurogenic bowel, not elsewhere classified: Secondary | ICD-10-CM | POA: Diagnosis not present

## 2014-12-22 DIAGNOSIS — G35 Multiple sclerosis: Secondary | ICD-10-CM | POA: Diagnosis not present

## 2014-12-22 DIAGNOSIS — N319 Neuromuscular dysfunction of bladder, unspecified: Secondary | ICD-10-CM | POA: Diagnosis not present

## 2014-12-22 DIAGNOSIS — E039 Hypothyroidism, unspecified: Secondary | ICD-10-CM | POA: Diagnosis not present

## 2014-12-22 DIAGNOSIS — I10 Essential (primary) hypertension: Secondary | ICD-10-CM | POA: Diagnosis not present

## 2014-12-24 ENCOUNTER — Ambulatory Visit (INDEPENDENT_AMBULATORY_CARE_PROVIDER_SITE_OTHER): Payer: Medicare Other | Admitting: Pharmacist Clinician (PhC)/ Clinical Pharmacy Specialist

## 2014-12-24 ENCOUNTER — Ambulatory Visit (INDEPENDENT_AMBULATORY_CARE_PROVIDER_SITE_OTHER): Payer: Medicare Other | Admitting: Internal Medicine

## 2014-12-24 ENCOUNTER — Encounter: Payer: Self-pay | Admitting: Internal Medicine

## 2014-12-24 ENCOUNTER — Encounter (INDEPENDENT_AMBULATORY_CARE_PROVIDER_SITE_OTHER): Payer: Medicare Other

## 2014-12-24 VITALS — BP 128/70 | HR 78 | Ht 62.0 in | Wt 262.0 lb

## 2014-12-24 DIAGNOSIS — G35D Multiple sclerosis, unspecified: Secondary | ICD-10-CM

## 2014-12-24 DIAGNOSIS — N319 Neuromuscular dysfunction of bladder, unspecified: Secondary | ICD-10-CM | POA: Diagnosis not present

## 2014-12-24 DIAGNOSIS — G35 Multiple sclerosis: Secondary | ICD-10-CM

## 2014-12-24 DIAGNOSIS — I4891 Unspecified atrial fibrillation: Secondary | ICD-10-CM | POA: Diagnosis not present

## 2014-12-24 DIAGNOSIS — I480 Paroxysmal atrial fibrillation: Secondary | ICD-10-CM

## 2014-12-24 DIAGNOSIS — Z7901 Long term (current) use of anticoagulants: Secondary | ICD-10-CM | POA: Diagnosis not present

## 2014-12-24 DIAGNOSIS — E039 Hypothyroidism, unspecified: Secondary | ICD-10-CM | POA: Diagnosis not present

## 2014-12-24 DIAGNOSIS — I10 Essential (primary) hypertension: Secondary | ICD-10-CM | POA: Diagnosis not present

## 2014-12-24 DIAGNOSIS — K592 Neurogenic bowel, not elsewhere classified: Secondary | ICD-10-CM | POA: Diagnosis not present

## 2014-12-24 LAB — POCT INR: INR: 2.4

## 2014-12-24 NOTE — Progress Notes (Signed)
OFFICE NOTE  Chief Complaint:   more short of breath, A. fib  Primary Care Physician: [name], MD  HPI:  Toni [name] is a 69 year old female with multiple sclerosis, paroxysmal atrial fibrillation, on Coumadin, morbid obesity, obstructive sleep apnea, and dyslipidemia. She has had worsening swelling in her legs, thought to be due to an MS flare. She has a history of varicose veins with severe venous insufficiency in both the right and left greater saphenous veins. The right greater saphenous vein was actually successfully ablated by my partner, Dr. Elisabeth Cara, about 1 to 2 years ago. She does have residual reflux in the left greater saphenous vein extending from the proximal calf up to the saphenofemoral junction. The vein measured between 4 and 8 mm with between 1 and 6 seconds of reflux. We had discussed ablation of that before, but she has canceled several times at procedures. At this point, I think there is little benefit to continuing to try for her to undergo ablation. 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 She can wear work showed a compression stockings and this does seem to help her. With regards to her atrial fibrillation she has not had significant recurrence to her knowledge. She continues on warfarin and has been therapeutic, with her INR of 1.9 in the office by fingerstick today.  Toni [name] returns today for followup. She reports her MS is somewhat progressive. She continues to have some enlargement of her legs but no significant swelling. There is a mild varicosities in the right lower extremity. She is unaware of her atrial fibrillation. INR today was 2.3.   I saw Toni [name] back in the office today,  She is describing more shortness of breath and feels that she's been in A. Fib more recently. In fact today her EKG does show persistent atrial fibrillation. In the past she's been paroxysmal , but it may be that she is more  frequently having A. Fib. She has trouble telling whether not she is more short of breath or symptomatic from A. Fib versus her multiple sclerosis. She is also reporting more swelling in her left leg.  PMHx:  Past Medical History  Diagnosis Date  . Multiple sclerosis     has had this may 1989-[facility] reg doc  . Hypertension   . PAF (paroxysmal atrial fibrillation)     on coumadin; documented on monitor 06/2010  . Thyroid disease   . HLD (hyperlipidemia)   . Shortness of breath   . Pneumonia   . Hyperthyroidism   . Edema     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' ablation of R GSV 2012  . Depression   . Anxiety   . Headache(784.0)     MIGRAINE   . Chronic pain   . Hx of echocardiogram 04/22/10    EF 55-60%, no valve issues  . History of stress test 06/21/10    limited exam with some degree of breast attenuatin of the apex however a component of apical ischemia is not excluded, EF 62%; cardiac cath   . Sleep apnea     cpap    Past Surgical History  Procedure Laterality Date  . Cervical fusion      with correction-june 2005  . Infusion pump implantation      baclofen infusion in lower abd  . Venous ablation  12/16/10    radiofreq ablation -Dr Elisabeth Cara and [facility]  . Child  births      X61  . Cardiac catheterization  06/22/10    normal coronary arteries, PAF  . Tonsillectomy    . Pain pump revision N/A 07/29/2014    Procedure: Baclofen pump replacement;  Surgeon: Erline Levine, MD;  Location: [place] [facility];  Service: Neurosurgery;  Laterality: N/A;  Baclofen pump replacement    FAMHx:  Family History  Problem Relation Age of Onset  . Coronary artery disease Father     at age 50  . Coronary artery disease Maternal Grandmother   . Depression Maternal Grandmother   . Cancer Paternal Grandmother   . Depression Mother   . Alcoholism Son     SOCHx:   reports that she quit smoking about 38 years ago. She has never used smokeless tobacco. She reports that she  drinks alcohol. She reports that she does not use illicit drugs.  ALLERGIES:  Allergies  Allergen Reactions  . Oxycodone     Oral oxycodone causes vomiting  . Chlorhexidine Gluconate     Sores at site  . Zoloft [Sertraline] Hives, Swelling and Rash    ROS: A comprehensive review of systems was negative except for: Constitutional: positive for fatigue Respiratory: positive for dyspnea on exertion Cardiovascular: positive for lower extremity edema and palpitations Neurological: positive for neuropathy  HOME MEDS: Current Outpatient Prescriptions  Medication Sig Dispense Refill  . ALPRAZolam (XANAX) 0.5 MG tablet Take 1 tablet (0.5 mg total) by mouth 2 (two) times daily as needed for anxiety. 180 tablet 1  . baclofen (LIORESAL) 20 MG tablet Take 1 tablet (20 mg total) by mouth 3 (three) times daily as needed for muscle spasms. 90 each 3  . buPROPion (WELLBUTRIN XL) 300 MG 24 hr tablet Take 300 mg by mouth daily.    . calcium carbonate (OS-CAL) 600 MG TABS tablet Take 600 mg by mouth 2 (two) times daily with a meal.    . cephALEXin (KEFLEX) 250 MG capsule Take 1 capsule by mouth daily.    . cetirizine (ZYRTEC) 10 MG tablet Take 10 mg by mouth daily.    Marland Kitchen co-enzyme Q-10 30 MG capsule Take 30 mg by mouth daily.    . corticotropin (ATHACAR H.P.) 80 UNIT/ML injectable gel 5 injection for 5 Days in a row every monthly from October on, 2015 . (Patient taking differently: 5 injection for 5 Days in a row every monthly.) 5 mL 5  . docusate sodium (COLACE) 100 MG capsule Take 100 mg by mouth 2 (two) times daily.    . furosemide (LASIX) 40 MG tablet Take 40 mg by mouth in the morning and 20 mg (1/2 tablet) by mouth in evening. 135 tablet 0  . gabapentin (NEURONTIN) 300 MG capsule Take 300 mg by mouth. 3-4x/daily    . GENERLAC 10 GM/15ML SOLN 1-2 tbsp daily  (lactulose)    . levothyroxine (SYNTHROID, LEVOTHROID) 75 MCG tablet Take 75 mcg by mouth daily.    . Multiple Minerals-Vitamins (CALCIUM &  VIT D3 BONE HEALTH PO) Take by mouth 2 (two) times daily. 1000/500mg     . Multiple Vitamin (MULITIVITAMIN WITH MINERALS) TABS Take 1 tablet by mouth daily.    . Nebivolol HCl 20 MG TABS Take 20 mg by mouth daily.     . Omega-3 Fatty Acids (FISH OIL) 600 MG CAPS Take 600 mg by mouth daily.     Marland Kitchen omeprazole (PRILOSEC) 20 MG capsule Take 20 mg by mouth daily.     . ranitidine (ZANTAC) 150 MG tablet  Take 150 mg by mouth at bedtime.     . simvastatin (ZOCOR) 20 MG tablet Take 20 mg by mouth at bedtime.     . Tamsulosin HCl (FLOMAX) 0.4 MG CAPS Take 0.4 mg by mouth daily.    Marland Kitchen warfarin (COUMADIN) 5 MG tablet Take 1 tablet (5 mg total) by mouth daily. 90 tablet 3   No current facility-administered medications for this visit.    LABS/IMAGING: No results found for this or any previous visit (from the past 48 hour(s)). No results found.  VITALS: BP 128/70 mmHg  Pulse 78  Ht 5\' 2"  (1.575 m)  Wt 262 lb (118.842 kg)  BMI 47.91 kg/m2  EXAM: General appearance: alert and no distress Lungs: clear to auscultation bilaterally Heart: irregularly irregular rhythm Abdomen: soft, non-tender; bowel sounds normal; no masses,  no organomegaly and morbidly obese Extremities: edema trace bilateral and varicose veins noted Pulses: 2+ and symmetric  EKG:  atrial fibrillation with controlled ventricular response at 78  ASSESSMENT: 1. Persistent atrial fibrillation 2. Morbid obesity 3. Obstructive sleep apnea on CPAP 4. Multiple sclerosis 5. Chronic anticoagulation on warfarin - INR 2.3 today 6. Chronic venous insufficiency 7. Hypertension-controlled  PLAN: 1.   Mrs. Elley Harp to be more short of breath and has had more persistent atrial fibrillation. I like to place a one-week monitor to see if we can get an idea about the burden of her atrial fibrillation. If she continues in persistent atrial fibrillation and really is more symptomatic, we may need to consider a cardioversion. We'll contact her  with the results of that monitor and if abnormal likely schedule her for a cardioversion. Her INR was therapeutic today and has been for months.  Pixie Casino, MD, [facility] Attending Cardiologist [place] 12/24/2014, 5:02 PM

## 2014-12-24 NOTE — Patient Instructions (Signed)
Your physician has recommended that you wear an event monitor for 7 days. Event monitors are medical devices that record the heart's electrical activity. Doctors most often [name] these monitors to diagnose arrhythmias. Arrhythmias are problems with the speed or rhythm of the heartbeat. The monitor is a small, portable device. You can wear one while you do your normal daily activities. This is usually used to diagnose what is causing palpitations/syncope (passing out). >> if the event monitor shows persistent atrial fibrillation - Dr. Debara Pickett will likely want to to a cardioversion   Your physician recommends that you schedule a follow-up appointment in 1 month - after your monitor.     TIPS -  REMINDERS FOR MONITOR 1. The sensor is the lanyard that is worn around your neck every day - this is powered by a battery that needs to be changed every day 2. The monitor is the device that allows you to record symptoms - this will need to be charged daily 3. The sensor & monitor need to be within 100 feet of each other at all times 4. The sensor connects to the electrodes (stickers) - these should be changed every 24-48 hours (you do not have to remove them when you bathe, just make sure they are dry when you connect it back to the sensor 5. If you need more supplies (electrodes, batteries), please call the 1-800 # on the back of the pamphlet and [employer] will mail you more supplies 6. If your skin becomes sensitive, please try the sample pack of sensitive skin electrodes (the white packet in your silver box) and call [employer] to have them mail you more of these type of electrodes 7. When you are finish wearing the monitor, please place all supplies back in the silver box, place the silver box in the pre-packaged UPS bag and drop off at [employer] or call them so they can come pick it up   Cardiac Event Monitoring A cardiac event monitor is a small recording device used to help detect abnormal heart rhythms  (arrhythmias). The monitor is used to record heart rhythm when noticeable symptoms such as the following occur:  Fast heartbeats (palpitations), such as heart racing or fluttering.  Dizziness.  Fainting or light-headedness.  Unexplained weakness. The monitor is wired to two electrodes placed on your chest. Electrodes are flat, sticky disks that attach to your skin. The monitor can be worn for up to 30 days. You will wear the monitor at all times, except when bathing.  HOW TO USE YOUR CARDIAC EVENT MONITOR A technician will prepare your chest for the electrode placement. The technician will show you how to place the electrodes, how to work the monitor, and how to replace the batteries. Take time to practice using the monitor before you leave the office. Make sure you understand how to send the information from the monitor to your health care provider. This requires a telephone with a landline, not a cell phone. You need to:  Wear your monitor at all times, except when you are in water:  Do not get the monitor wet.  Take the monitor off when bathing. Do not swim or use a hot tub with it on.  Keep your skin clean. Do not put body lotion or moisturizer on your chest.  Change the electrodes daily or any time they stop sticking to your skin. You might need to use tape to keep them on.  It is possible that your skin under the electrodes could become  irritated. To keep this from happening, try to put the electrodes in slightly different places on your chest. However, they must remain in the area under your left breast and in the upper right section of your chest.  Make sure the monitor is safely clipped to your clothing or in a location close to your body that your health care provider recommends.  Press the button to record when you feel symptoms of heart trouble, such as dizziness, weakness, light-headedness, palpitations, thumping, shortness of breath, unexplained weakness, or a fluttering or  racing heart. The monitor is always on and records what happened slightly before you pressed the button, so do not worry about being too late to get good information.  Keep a diary of your activities, such as walking, doing chores, and taking medicine. It is especially important to note what you were doing when you pushed the button to record your symptoms. This will help your health care provider determine what might be contributing to your symptoms. The information stored in your monitor will be reviewed by your health care provider alongside your diary entries.  Send the recorded information as recommended by your health care provider. It is important to understand that it will take some time for your health care provider to process the results.  Change the batteries as recommended by your health care provider. SEEK IMMEDIATE MEDICAL CARE IF:   You have chest pain.  You have extreme difficulty breathing or shortness of breath.  You develop a very fast heartbeat that persists.  You develop dizziness that does not go away.  You faint or constantly feel you are about to faint. Document Released: 05/16/2008 Document Revised: 12/22/2013 Document Reviewed: 02/03/2013 [facility] Patient Information 2015 [place], [place]. This information is not intended to replace advice given to you by your health care provider. Make sure you discuss any questions you have with your health care provider.

## 2014-12-25 DIAGNOSIS — I10 Essential (primary) hypertension: Secondary | ICD-10-CM | POA: Diagnosis not present

## 2014-12-25 DIAGNOSIS — E039 Hypothyroidism, unspecified: Secondary | ICD-10-CM | POA: Diagnosis not present

## 2014-12-25 DIAGNOSIS — G35 Multiple sclerosis: Secondary | ICD-10-CM | POA: Diagnosis not present

## 2014-12-25 DIAGNOSIS — N319 Neuromuscular dysfunction of bladder, unspecified: Secondary | ICD-10-CM | POA: Diagnosis not present

## 2014-12-25 DIAGNOSIS — I4891 Unspecified atrial fibrillation: Secondary | ICD-10-CM | POA: Diagnosis not present

## 2014-12-25 DIAGNOSIS — K592 Neurogenic bowel, not elsewhere classified: Secondary | ICD-10-CM | POA: Diagnosis not present

## 2014-12-29 DIAGNOSIS — I4891 Unspecified atrial fibrillation: Secondary | ICD-10-CM | POA: Diagnosis not present

## 2014-12-29 DIAGNOSIS — K592 Neurogenic bowel, not elsewhere classified: Secondary | ICD-10-CM | POA: Diagnosis not present

## 2014-12-29 DIAGNOSIS — I10 Essential (primary) hypertension: Secondary | ICD-10-CM | POA: Diagnosis not present

## 2014-12-29 DIAGNOSIS — G35 Multiple sclerosis: Secondary | ICD-10-CM | POA: Diagnosis not present

## 2014-12-29 DIAGNOSIS — E039 Hypothyroidism, unspecified: Secondary | ICD-10-CM | POA: Diagnosis not present

## 2014-12-29 DIAGNOSIS — N319 Neuromuscular dysfunction of bladder, unspecified: Secondary | ICD-10-CM | POA: Diagnosis not present

## 2014-12-31 DIAGNOSIS — G35 Multiple sclerosis: Secondary | ICD-10-CM | POA: Diagnosis not present

## 2014-12-31 DIAGNOSIS — E039 Hypothyroidism, unspecified: Secondary | ICD-10-CM | POA: Diagnosis not present

## 2014-12-31 DIAGNOSIS — K592 Neurogenic bowel, not elsewhere classified: Secondary | ICD-10-CM | POA: Diagnosis not present

## 2014-12-31 DIAGNOSIS — N319 Neuromuscular dysfunction of bladder, unspecified: Secondary | ICD-10-CM | POA: Diagnosis not present

## 2014-12-31 DIAGNOSIS — I10 Essential (primary) hypertension: Secondary | ICD-10-CM | POA: Diagnosis not present

## 2014-12-31 DIAGNOSIS — I4891 Unspecified atrial fibrillation: Secondary | ICD-10-CM | POA: Diagnosis not present

## 2015-01-04 DIAGNOSIS — I4891 Unspecified atrial fibrillation: Secondary | ICD-10-CM | POA: Diagnosis not present

## 2015-01-04 DIAGNOSIS — G35 Multiple sclerosis: Secondary | ICD-10-CM | POA: Diagnosis not present

## 2015-01-04 DIAGNOSIS — N319 Neuromuscular dysfunction of bladder, unspecified: Secondary | ICD-10-CM | POA: Diagnosis not present

## 2015-01-04 DIAGNOSIS — I10 Essential (primary) hypertension: Secondary | ICD-10-CM | POA: Diagnosis not present

## 2015-01-04 DIAGNOSIS — K592 Neurogenic bowel, not elsewhere classified: Secondary | ICD-10-CM | POA: Diagnosis not present

## 2015-01-04 DIAGNOSIS — E039 Hypothyroidism, unspecified: Secondary | ICD-10-CM | POA: Diagnosis not present

## 2015-01-11 DIAGNOSIS — I10 Essential (primary) hypertension: Secondary | ICD-10-CM | POA: Diagnosis not present

## 2015-01-11 DIAGNOSIS — E039 Hypothyroidism, unspecified: Secondary | ICD-10-CM | POA: Diagnosis not present

## 2015-01-11 DIAGNOSIS — N319 Neuromuscular dysfunction of bladder, unspecified: Secondary | ICD-10-CM | POA: Diagnosis not present

## 2015-01-11 DIAGNOSIS — I4891 Unspecified atrial fibrillation: Secondary | ICD-10-CM | POA: Diagnosis not present

## 2015-01-11 DIAGNOSIS — K592 Neurogenic bowel, not elsewhere classified: Secondary | ICD-10-CM | POA: Diagnosis not present

## 2015-01-11 DIAGNOSIS — G35 Multiple sclerosis: Secondary | ICD-10-CM | POA: Diagnosis not present

## 2015-01-14 ENCOUNTER — Telehealth: Payer: Self-pay | Admitting: Neurology

## 2015-01-14 NOTE — Telephone Encounter (Signed)
I called the patient. Her alarm is set for 7/19. Appointment scheduled for 7/11.

## 2015-01-14 NOTE — Telephone Encounter (Signed)
Patient called requesting to know when her Baclofen pump needs a refill and needs to schedule and appoinment for this. Please call and advise. Patient can be reached at [phone].

## 2015-01-19 DIAGNOSIS — G35 Multiple sclerosis: Secondary | ICD-10-CM | POA: Diagnosis not present

## 2015-01-19 DIAGNOSIS — E039 Hypothyroidism, unspecified: Secondary | ICD-10-CM | POA: Diagnosis not present

## 2015-01-19 DIAGNOSIS — I4891 Unspecified atrial fibrillation: Secondary | ICD-10-CM | POA: Diagnosis not present

## 2015-01-19 DIAGNOSIS — I10 Essential (primary) hypertension: Secondary | ICD-10-CM | POA: Diagnosis not present

## 2015-01-19 DIAGNOSIS — N319 Neuromuscular dysfunction of bladder, unspecified: Secondary | ICD-10-CM | POA: Diagnosis not present

## 2015-01-19 DIAGNOSIS — K592 Neurogenic bowel, not elsewhere classified: Secondary | ICD-10-CM | POA: Diagnosis not present

## 2015-01-20 ENCOUNTER — Other Ambulatory Visit: Payer: Self-pay

## 2015-01-20 DIAGNOSIS — G4733 Obstructive sleep apnea (adult) (pediatric): Secondary | ICD-10-CM

## 2015-01-25 ENCOUNTER — Encounter: Payer: Self-pay | Admitting: Neurology

## 2015-01-25 ENCOUNTER — Ambulatory Visit (INDEPENDENT_AMBULATORY_CARE_PROVIDER_SITE_OTHER): Payer: Medicare Other | Admitting: Neurology

## 2015-01-25 VITALS — BP 136/84 | HR 88 | Resp 20

## 2015-01-25 DIAGNOSIS — I509 Heart failure, unspecified: Secondary | ICD-10-CM | POA: Diagnosis not present

## 2015-01-25 DIAGNOSIS — E249 Cushing's syndrome, unspecified: Secondary | ICD-10-CM | POA: Diagnosis not present

## 2015-01-25 DIAGNOSIS — Z5181 Encounter for therapeutic drug level monitoring: Secondary | ICD-10-CM

## 2015-01-25 DIAGNOSIS — K5909 Other constipation: Secondary | ICD-10-CM | POA: Insufficient documentation

## 2015-01-25 DIAGNOSIS — G4737 Central sleep apnea in conditions classified elsewhere: Secondary | ICD-10-CM | POA: Diagnosis not present

## 2015-01-25 DIAGNOSIS — G35 Multiple sclerosis: Secondary | ICD-10-CM | POA: Diagnosis not present

## 2015-01-25 DIAGNOSIS — E240 Pituitary-dependent Cushing's disease: Secondary | ICD-10-CM

## 2015-01-25 DIAGNOSIS — K5900 Constipation, unspecified: Secondary | ICD-10-CM

## 2015-01-25 MED ORDER — LACTULOSE 10 GM/15ML PO SOLN: ORAL | Status: DC

## 2015-01-25 MED ORDER — CORTICOTROPIN 80 UNIT/ML IJ GEL: INTRAMUSCULAR | Status: DC

## 2015-01-25 NOTE — Progress Notes (Signed)
Provider:  [name], M D  Referring Provider: Prince Solian, MD Primary Care Physician:  [name], MD  Chief Complaint  Patient presents with  . Follow-up    MS, cpap, rm 11, alone    HPI:  Toni [name] is a 69 y.o. female seen here as a revisit for MS follow up.   The patient has had her baclofen pump replaced in December 2015 and March 2016 . The pump refill went well.  Before the pump was replaced, she had noted a higher and higher need for oral baclofen supplement. She uses ACTH to avoid the previously steroid insuced a fib , she has less weight gain and less HTN on the ACTH. Her right leg is weaker than last year, but she has not fallen. She is her for a routine visit , has no acute questions.  Her extensive history is re viewed in previous noted.   01-25-15 Is until is here for routine visit today and she brought me a beautiful Gardenia blossom. Her sleepiness score is endorsed at 8 points fatigue severity 34, we have today also a download from her [employer] auto system.   Unfortunately the data very concerning. By the patient is compliant with 4 hours and 25 minutes of daily use and 96.7% compliance 29 out of 30 days. She has had a mean pressure of 12 cm water peak pressure of 14 cm water but an average AHI of 37.9. Ports that her nose is congested and often doesn't allow unhindered airflow and that she sometimes has to take the mask off for that reason. However this is in an extraordinary high AHI. Her machine doesn't differentiate between central and obstructives. She reports using nasal spray and allergy medication to prevent rhinitis.   She will need to see ENT for this remaining nasal p obstruction.  I will order the machine to be autotration set 5-15 cm water .  Continues with nasal pillows, chin strap for oral air leakage.  DME [employer]; please allow her to change to a drreamwear interface and fit it.       Review of Systems: Out of a complete 14  system review, the patient complains of only the following symptoms, and all other reviewed systems are negative. Wheelchair bound.  Now back at home after rehab with PT> carpal tunnel.  Epworth 8 and FSS 34.  History   Social History  . Marital Status: Married    Spouse Name: N/A  . Number of Children: 2  . Years of Education: COLLEGE   Occupational History  . RETIRED    Social History Main Topics  . Smoking status: Former Smoker    Quit date: 08/21/1976  . Smokeless tobacco: Never Used     Comment: QUIT 1978  . Alcohol Use: Yes     Comment: SOCIALLY  . Drug Use: No  . Sexual Activity: Yes   Other Topics Concern  . Not on file   Social History Narrative   Lives in [place] with Tillman Sers [phone], [phone]          Family History  Problem Relation Age of Onset  . Coronary artery disease Father     at age 20  . Coronary artery disease Maternal Grandmother   . Depression Maternal Grandmother   . Cancer Paternal Grandmother   . Depression Mother   . Alcoholism Son     Past Medical History  Diagnosis Date  . Multiple sclerosis  has had this may 1989-[facility] reg doc  . Hypertension   . PAF (paroxysmal atrial fibrillation)     on coumadin; documented on monitor 06/2010  . Thyroid disease   . HLD (hyperlipidemia)   . Shortness of breath   . Pneumonia   . Hyperthyroidism   . Edema     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' ablation of R GSV 2012  . Depression   . Anxiety   . Headache(784.0)     MIGRAINE   . Chronic pain   . Hx of echocardiogram 04/22/10    EF 55-60%, no valve issues  . History of stress test 06/21/10    limited exam with some degree of breast attenuatin of the apex however a component of apical ischemia is not excluded, EF 62%; cardiac cath   . Sleep apnea     cpap    Past Surgical History  Procedure Laterality Date  . Cervical fusion      with correction-june 2005  . Infusion pump implantation      baclofen  infusion in lower abd  . Venous ablation  12/16/10    radiofreq ablation -Dr Elisabeth Cara and [facility]  . Child births      X75  . Cardiac catheterization  06/22/10    normal coronary arteries, PAF  . Tonsillectomy    . Pain pump revision N/A 07/29/2014    Procedure: Baclofen pump replacement;  Surgeon: Erline Levine, MD;  Location: [place] [facility];  Service: Neurosurgery;  Laterality: N/A;  Baclofen pump replacement    Current Outpatient Prescriptions  Medication Sig Dispense Refill  . ALPRAZolam (XANAX) 0.5 MG tablet Take 1 tablet (0.5 mg total) by mouth 2 (two) times daily as needed for anxiety. 180 tablet 1  . baclofen (LIORESAL) 20 MG tablet Take 1 tablet (20 mg total) by mouth 3 (three) times daily as needed for muscle spasms. 90 each 3  . buPROPion (WELLBUTRIN XL) 300 MG 24 hr tablet Take 300 mg by mouth daily.    . calcium carbonate (OS-CAL) 600 MG TABS tablet Take 600 mg by mouth 2 (two) times daily with a meal.    . cephALEXin (KEFLEX) 250 MG capsule Take 1 capsule by mouth daily.    . cetirizine (ZYRTEC) 10 MG tablet Take 10 mg by mouth daily.    Marland Kitchen co-enzyme Q-10 30 MG capsule Take 30 mg by mouth daily.    . corticotropin (ATHACAR H.P.) 80 UNIT/ML injectable gel 5 injection for 5 Days in a row every monthly from October on, 2015 . (Patient taking differently: 5 injection for 5 Days in a row every monthly.) 5 mL 5  . docusate sodium (COLACE) 100 MG capsule Take 100 mg by mouth 2 (two) times daily.    . furosemide (LASIX) 40 MG tablet Take 40 mg by mouth in the morning and 20 mg (1/2 tablet) by mouth in evening. 135 tablet 0  . gabapentin (NEURONTIN) 300 MG capsule Take 300 mg by mouth. 3-4x/daily    . GENERLAC 10 GM/15ML SOLN 1-2 tbsp daily  (lactulose)    . levothyroxine (SYNTHROID, LEVOTHROID) 75 MCG tablet Take 75 mcg by mouth daily.    . Multiple Minerals-Vitamins (CALCIUM & VIT D3 BONE HEALTH PO) Take by mouth 2 (two) times daily. 1000/500mg     . Multiple Vitamin (MULITIVITAMIN WITH  MINERALS) TABS Take 1 tablet by mouth daily.    . Nebivolol HCl 20 MG TABS Take 20 mg by mouth daily.     Marland Kitchen  Omega-3 Fatty Acids (FISH OIL) 600 MG CAPS Take 600 mg by mouth daily.     Marland Kitchen omeprazole (PRILOSEC) 20 MG capsule Take 20 mg by mouth daily.     . ranitidine (ZANTAC) 150 MG tablet Take 150 mg by mouth at bedtime.     . simvastatin (ZOCOR) 20 MG tablet Take 20 mg by mouth at bedtime.     . Tamsulosin HCl (FLOMAX) 0.4 MG CAPS Take 0.4 mg by mouth daily.    Marland Kitchen warfarin (COUMADIN) 5 MG tablet Take 1 tablet (5 mg total) by mouth daily. 90 tablet 3   No current facility-administered medications for this visit.    Allergies as of 01/25/2015 - Review Complete 01/25/2015  Allergen Reaction Noted  . Oxycodone  05/12/2013  . Chlorhexidine gluconate  11/26/2011  . Zoloft [sertraline] Hives, Swelling, and Rash 05/15/2013    Vitals: BP 136/84 mmHg  Pulse 88  Resp 20  Ht   Wt  Last Weight:  Wt Readings from Last 1 Encounters:  12/24/14 262 lb (118.842 kg)   Last Height:   Ht Readings from Last 1 Encounters:  12/24/14 5\' 2"  (1.575 m)    Physical exam:   Height ;  5.4 ", 260 pounds  General: The patient is awake, alert and appears not in acute distress. The patient is well groomed.  Head: Normocephalic, atraumatic. Neck is supple. Mallampati 4 neck circumference:19 i nches .  Cardiovascular: Regular rate and rhythm today , without murmurs or carotid bruit, and without distended neck veins.  Respiratory: Lungs are clear to auscultation.  Skin: evidence of edema, not Rash, Injection site reaction.  Trunk: BMI is elevated. Neurologic exam :  The patient is awake and alert, oriented to place and time.  Memory subjective described as intact. There is a normal attention span & concentration ability.  Speech without dysarthria, dysphonia , delayed wordfinding- aphasia.  Mood and affect are appropriate.   Cranial nerves:  Pupils are equal and briskly reactive to light. Extraocular  movements in vertical and horizontal planes intact and without nystagmus.  Visual fields by finger perimetry are intact.  Hearing to finger rub intact. Facial sensation intact to fine touch.  Facial motor strength is symmetric and tongue and uvula move midline.  Motor exam: Patient reports that she is losing dexterity in her right hand feels clumsy and also numbish. She reports a gloved feeling.  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  She reports trouble with spitting up fine objects like picking lint or picking up a pill. Numb fingers. Lost proprioception.   Decreased strength in all extremities.  Sensory: Fine touch, pinprick and vibration were restricted in the right, dominant hand, both feet Coordination:  He reports changing in her handwriting and trouble holding a pencil in her right him a dominant hand.  Rapid alternating movements in the fingers/hands is slow.  Gait and station wheelchair.   Deep tendon reflexes: in the upper and lower extremities are attenuated, right babinski positive- up going.   ASSESSMENT AND PLAN  69 y.o. year old female  With  Multiple sclerosis; Hypertension; PAF (paroxysmal atrial fibrillation); Thyroid disease; obesity hypoventilation,   Pneumonia; Sleep apnea; Hyperthyroidism; Edema; Depression; Anxiety; Headache(784.0); Chronic pain; echocardiogram (04/22/10); and History of stress test (06/21/10) here with for routine followup of Primary Progressive MS. Cognitive daley, it takes her longer to speak, to write and she has numbness of the fingers, left and right. This started acuetly March 2016. She cannot  use a fork,,eats with a spoon. Elevated body temperature.    PLAN :  Continue ACTHAR 2 days a week - she has atrial fibrillation again, concerned that she needs an ablation. Is acthar contributing to a fib, as did her steroid pulses?     The PT has noted improvement while on ACTHAR. She reports better sleep, better bowl control  and bladder control.  She reports more burning, swelling of the abdomen.  She will need to see ENT for this remaining nasal  obstruction.  I will order the machine to be autotration set 5-15 cm water .  Continues with nasal pillows, chin strap for oral air leakage.  DME [employer]; please allow her to change to a drreamwear interface and fit it.  Continue Baclofen PUMP with Dr [name].   Keep next follow up appointment alternating with me or  NP in 4 months.Please perform MMSE/  MOCA and leave this order under LOS.  No MRI - she is not interested ,  She will have a blood draw today and in September , CBC and CMET with PCP.    Asencion Partridge Andros Channing MD  01/25/2015

## 2015-01-27 DIAGNOSIS — I10 Essential (primary) hypertension: Secondary | ICD-10-CM | POA: Diagnosis not present

## 2015-01-27 DIAGNOSIS — K592 Neurogenic bowel, not elsewhere classified: Secondary | ICD-10-CM | POA: Diagnosis not present

## 2015-01-27 DIAGNOSIS — E039 Hypothyroidism, unspecified: Secondary | ICD-10-CM | POA: Diagnosis not present

## 2015-01-27 DIAGNOSIS — G35 Multiple sclerosis: Secondary | ICD-10-CM | POA: Diagnosis not present

## 2015-01-27 DIAGNOSIS — I4891 Unspecified atrial fibrillation: Secondary | ICD-10-CM | POA: Diagnosis not present

## 2015-01-27 DIAGNOSIS — N319 Neuromuscular dysfunction of bladder, unspecified: Secondary | ICD-10-CM | POA: Diagnosis not present

## 2015-02-01 ENCOUNTER — Telehealth: Payer: Self-pay | Admitting: Neurology

## 2015-02-01 ENCOUNTER — Ambulatory Visit (INDEPENDENT_AMBULATORY_CARE_PROVIDER_SITE_OTHER): Payer: Medicare Other | Admitting: Pharmacist Clinician (PhC)/ Clinical Pharmacy Specialist

## 2015-02-01 ENCOUNTER — Encounter: Payer: Self-pay | Admitting: Internal Medicine

## 2015-02-01 ENCOUNTER — Ambulatory Visit (INDEPENDENT_AMBULATORY_CARE_PROVIDER_SITE_OTHER): Payer: Medicare Other | Admitting: Internal Medicine

## 2015-02-01 VITALS — BP 130/80 | HR 88 | Ht 62.0 in

## 2015-02-01 DIAGNOSIS — I480 Paroxysmal atrial fibrillation: Secondary | ICD-10-CM

## 2015-02-01 DIAGNOSIS — G35 Multiple sclerosis: Secondary | ICD-10-CM | POA: Diagnosis not present

## 2015-02-01 DIAGNOSIS — Z9989 Dependence on other enabling machines and devices: Secondary | ICD-10-CM

## 2015-02-01 DIAGNOSIS — G4733 Obstructive sleep apnea (adult) (pediatric): Secondary | ICD-10-CM

## 2015-02-01 DIAGNOSIS — I4891 Unspecified atrial fibrillation: Secondary | ICD-10-CM

## 2015-02-01 DIAGNOSIS — Z7901 Long term (current) use of anticoagulants: Secondary | ICD-10-CM | POA: Diagnosis not present

## 2015-02-01 LAB — POCT INR: INR: 2.2

## 2015-02-01 NOTE — Telephone Encounter (Signed)
I called the patient back.  Patient just wanted to ask that we take Generlac (Lactulose) off of med list since she got new Rx for Lactulose in the mail, and it was a duplicate entry.  I have done so.  She will call back if anything further is needed.

## 2015-02-01 NOTE — Patient Instructions (Signed)
Dr Debara Pickett has made no changes in your current medications or treatment plan.  Dr Debara Pickett recommends that you schedule a follow-up appointment in 6 months. You will receive a reminder letter in the mail two months in advance. If you don't receive a letter, please call our office to schedule the follow-up appointment.

## 2015-02-01 NOTE — Progress Notes (Signed)
OFFICE NOTE  Chief Complaint:  Follow-up A. fib  Primary Care Physician: [name], MD  HPI:  Toni [name] is a 69 year old female with multiple sclerosis, paroxysmal atrial fibrillation, on Coumadin, morbid obesity, obstructive sleep apnea, and dyslipidemia. She has had worsening swelling in her legs, thought to be due to an MS flare. She has a history of varicose veins with severe venous insufficiency in both the right and left greater saphenous veins. The right greater saphenous vein was actually successfully ablated by my partner, Dr. Elisabeth Cara, about 1 to 2 years ago. She does have residual reflux in the left greater saphenous vein extending from the proximal calf up to the saphenofemoral junction. The vein measured between 4 and 8 mm with between 1 and 6 seconds of reflux. We had discussed ablation of that before, but she has canceled several times at procedures. At this point, I think there is little benefit to continuing to try for her to undergo ablation. 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 She can wear work showed a compression stockings and this does seem to help her. With regards to her atrial fibrillation she has not had significant recurrence to her knowledge. She continues on warfarin and has been therapeutic, with her INR of 1.9 in the office by fingerstick today.  Toni [name] returns today for followup. She reports her MS is somewhat progressive. She continues to have some enlargement of her legs but no significant swelling. There is a mild varicosities in the right lower extremity. She is unaware of her atrial fibrillation. INR today was 2.3.   I saw Toni [name] back in the office today,  She is describing more shortness of breath and feels that she's been in A. Fib more recently. In fact today her EKG does show persistent atrial fibrillation. In the past she's been paroxysmal , but it may be that she is more frequently  having A. Fib. She has trouble telling whether not she is more short of breath or symptomatic from A. Fib versus her multiple sclerosis. She is also reporting more swelling in her left leg.  Toni [name] returns for follow-up today. She recently saw her neurologist regarding her MS which seems to be flaring. In addition she was noted to have significant apneic events despite using CPAP. There is concern for possible central sleep apnea. Both of these episodes are probably contributing to her recurrent atrial fibrillation. She's been wearing a monitor for a period of time which indicates a burden of A. fib of about 80% of the time. It does not appear that she is in persistent A. fib, rather she has had some episodes of sinus rhythm, but they're much less frequent than her A. Fib.  PMHx:  Past Medical History  Diagnosis Date  . Multiple sclerosis     has had this may 1989-[facility] reg doc  . Hypertension   . PAF (paroxysmal atrial fibrillation)     on coumadin; documented on monitor 06/2010  . Thyroid disease   . HLD (hyperlipidemia)   . Shortness of breath   . Pneumonia   . Hyperthyroidism   . Edema     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' ablation of R GSV 2012  . Depression   . Anxiety   . Headache(784.0)     MIGRAINE   . Chronic pain   . Hx of echocardiogram 04/22/10    EF 55-60%, no  valve issues  . History of stress test 06/21/10    limited exam with some degree of breast attenuatin of the apex however a component of apical ischemia is not excluded, EF 62%; cardiac cath   . Sleep apnea     cpap    Past Surgical History  Procedure Laterality Date  . Cervical fusion      with correction-june 2005  . Infusion pump implantation      baclofen infusion in lower abd  . Venous ablation  12/16/10    radiofreq ablation -Dr Elisabeth Cara and [facility]  . Child births      X12  . Cardiac catheterization  06/22/10    normal coronary arteries, PAF  . Tonsillectomy    . Pain pump  revision N/A 07/29/2014    Procedure: Baclofen pump replacement;  Surgeon: Erline Levine, MD;  Location: [facility];  Service: Neurosurgery;  Laterality: N/A;  Baclofen pump replacement    FAMHx:  Family History  Problem Relation Age of Onset  . Coronary artery disease Father     at age 58  . Coronary artery disease Maternal Grandmother   . Depression Maternal Grandmother   . Cancer Paternal Grandmother   . Depression Mother   . Alcoholism Son     SOCHx:   reports that she quit smoking about 38 years ago. She has never used smokeless tobacco. She reports that she drinks alcohol. She reports that she does not use illicit drugs.  ALLERGIES:  Allergies  Allergen Reactions  . Oxycodone     Oral oxycodone causes vomiting  . Chlorhexidine Gluconate     Sores at site  . Zoloft [Sertraline] Hives, Swelling and Rash    ROS: A comprehensive review of systems was negative except for: Constitutional: positive for fatigue Respiratory: positive for dyspnea on exertion Cardiovascular: positive for lower extremity edema and palpitations Neurological: positive for neuropathy  HOME MEDS: Current Outpatient Prescriptions  Medication Sig Dispense Refill  . ALPRAZolam (XANAX) 0.5 MG tablet Take 1 tablet (0.5 mg total) by mouth 2 (two) times daily as needed for anxiety. 180 tablet 1  . baclofen (LIORESAL) 20 MG tablet Take 1 tablet (20 mg total) by mouth 3 (three) times daily as needed for muscle spasms. 90 each 3  . buPROPion (WELLBUTRIN XL) 300 MG 24 hr tablet Take 300 mg by mouth daily.    . calcium carbonate (OS-CAL) 600 MG TABS tablet Take 600 mg by mouth 2 (two) times daily with a meal.    . cephALEXin (KEFLEX) 250 MG capsule Take 1 capsule by mouth daily.    . cetirizine (ZYRTEC) 10 MG tablet Take 10 mg by mouth daily.    Marland Kitchen co-enzyme Q-10 30 MG capsule Take 30 mg by mouth daily.    . corticotropin (ATHACAR H.P.) 80 UNIT/ML injectable gel Changed to twice weekly Akhtar used 80 units.  Cat[place]'s use. 10 mL 5  . docusate sodium (COLACE) 100 MG capsule Take 100 mg by mouth 2 (two) times daily.    . furosemide (LASIX) 40 MG tablet Take 40 mg by mouth in the morning and 20 mg (1/2 tablet) by mouth in evening. 135 tablet 0  . gabapentin (NEURONTIN) 300 MG capsule Take 300 mg by mouth. 3-4x/daily    . lactulose (CHRONULAC) 10 GM/15ML solution 1-2tablespoons daily 240 mL 3  . levothyroxine (SYNTHROID, LEVOTHROID) 75 MCG tablet Take 75 mcg by mouth daily.    . Multiple Minerals-Vitamins (CALCIUM & VIT D3 BONE HEALTH PO) Take by  mouth 2 (two) times daily. 1000/500mg     . Multiple Vitamin (MULITIVITAMIN WITH MINERALS) TABS Take 1 tablet by mouth daily.    . Nebivolol HCl 20 MG TABS Take 20 mg by mouth daily.     . Omega-3 Fatty Acids (FISH OIL) 600 MG CAPS Take 600 mg by mouth daily.     Marland Kitchen omeprazole (PRILOSEC) 20 MG capsule Take 20 mg by mouth daily.     . ranitidine (ZANTAC) 150 MG tablet Take 150 mg by mouth at bedtime.     . simvastatin (ZOCOR) 20 MG tablet Take 20 mg by mouth at bedtime.     . Tamsulosin HCl (FLOMAX) 0.4 MG CAPS Take 0.4 mg by mouth daily.    Marland Kitchen warfarin (COUMADIN) 5 MG tablet Take 1 tablet (5 mg total) by mouth daily. 90 tablet 3   No current facility-administered medications for this visit.    LABS/IMAGING: No results found for this or any previous visit (from the past 48 hour(s)). No results found.  VITALS: BP 130/80 mmHg  Pulse 88  Ht 5\' 2"  (1.575 m)  EXAM: Deferred  EKG:  atrial fibrillation with controlled ventricular response at 88  ASSESSMENT: 1. Paroxysmal atrial fibrillation - 80% frequency of A. fib 2. Morbid obesity 3. Obstructive sleep apnea on CPAP 4. Multiple sclerosis 5. Chronic anticoagulation on warfarin - INR 2.3 today 6. Chronic venous insufficiency 7. Hypertension-controlled  PLAN: 1.   Mrs. Ikard is having frequent paroxysmal A. fib but is not persistent at this point. About 80% of the time she was noted to be in A. fib  during monitoring. Given this, we could consider cardioversion however it is very likely that she may revert back to atrial fibrillation. This would negate the benefit of cardioversion. She would have to be on antiarrhythmics therapy to increase the likelihood of maintaining sinus. I also think her sleep apnea, which may also be central sleep apnea could be contributing to her recurrent A. fib. In addition she's had a flare of her MS symptoms. At this time she is appropriately anticoagulated. I would advise her to work with her neurologist to treat her central sleep apnea as much as possible, with consideration given to possible BiPAP therapy. I think she needs optimization of this and is much as we can optimize her MS before considering antiarrhythmics therapy. For now we'll continue anticoagulation and rate control.  Pixie Casino, MD, [facility] Attending Cardiologist [place] C Tzipporah Nagorski 02/01/2015, 6:57 PM

## 2015-02-01 NOTE — Telephone Encounter (Signed)
Patient called and requested to speak with Toni [name]. She stated that she received her medication in the mail and one of them is different from what she usually receives. Please call and advise.

## 2015-02-02 DIAGNOSIS — G35 Multiple sclerosis: Secondary | ICD-10-CM | POA: Diagnosis not present

## 2015-02-02 DIAGNOSIS — K592 Neurogenic bowel, not elsewhere classified: Secondary | ICD-10-CM | POA: Diagnosis not present

## 2015-02-02 DIAGNOSIS — N319 Neuromuscular dysfunction of bladder, unspecified: Secondary | ICD-10-CM | POA: Diagnosis not present

## 2015-02-02 DIAGNOSIS — E039 Hypothyroidism, unspecified: Secondary | ICD-10-CM | POA: Diagnosis not present

## 2015-02-02 DIAGNOSIS — I10 Essential (primary) hypertension: Secondary | ICD-10-CM | POA: Diagnosis not present

## 2015-02-02 DIAGNOSIS — I4891 Unspecified atrial fibrillation: Secondary | ICD-10-CM | POA: Diagnosis not present

## 2015-02-08 ENCOUNTER — Telehealth: Payer: Self-pay | Admitting: Internal Medicine

## 2015-02-08 DIAGNOSIS — J342 Deviated nasal septum: Secondary | ICD-10-CM | POA: Diagnosis not present

## 2015-02-08 DIAGNOSIS — R0981 Nasal congestion: Secondary | ICD-10-CM | POA: Diagnosis not present

## 2015-02-08 NOTE — Telephone Encounter (Signed)
[facility] called and informed that it was ok to change manufacturer

## 2015-02-08 NOTE — Telephone Encounter (Signed)
°  1. Which medications need to be refilled? Warfarin-Needs to change the manufacturer-needs the doctor okay  2. Which pharmacy is medication to be sent to?[facility] [phone]  3. Do they need a 30 day or 90 day supply?  no  4. Would they like a call back once the medication has been sent to the pharmacy? yes

## 2015-02-09 DIAGNOSIS — K592 Neurogenic bowel, not elsewhere classified: Secondary | ICD-10-CM | POA: Diagnosis not present

## 2015-02-09 DIAGNOSIS — G35 Multiple sclerosis: Secondary | ICD-10-CM | POA: Diagnosis not present

## 2015-02-09 DIAGNOSIS — I4891 Unspecified atrial fibrillation: Secondary | ICD-10-CM | POA: Diagnosis not present

## 2015-02-09 DIAGNOSIS — I10 Essential (primary) hypertension: Secondary | ICD-10-CM | POA: Diagnosis not present

## 2015-02-09 DIAGNOSIS — E785 Hyperlipidemia, unspecified: Secondary | ICD-10-CM | POA: Diagnosis not present

## 2015-02-09 DIAGNOSIS — Z7901 Long term (current) use of anticoagulants: Secondary | ICD-10-CM | POA: Diagnosis not present

## 2015-02-09 DIAGNOSIS — N319 Neuromuscular dysfunction of bladder, unspecified: Secondary | ICD-10-CM | POA: Diagnosis not present

## 2015-02-09 DIAGNOSIS — E039 Hypothyroidism, unspecified: Secondary | ICD-10-CM | POA: Diagnosis not present

## 2015-02-11 DIAGNOSIS — G35 Multiple sclerosis: Secondary | ICD-10-CM | POA: Diagnosis not present

## 2015-02-11 DIAGNOSIS — I4891 Unspecified atrial fibrillation: Secondary | ICD-10-CM | POA: Diagnosis not present

## 2015-02-11 DIAGNOSIS — N319 Neuromuscular dysfunction of bladder, unspecified: Secondary | ICD-10-CM | POA: Diagnosis not present

## 2015-02-11 DIAGNOSIS — K592 Neurogenic bowel, not elsewhere classified: Secondary | ICD-10-CM | POA: Diagnosis not present

## 2015-02-11 DIAGNOSIS — E039 Hypothyroidism, unspecified: Secondary | ICD-10-CM | POA: Diagnosis not present

## 2015-02-11 DIAGNOSIS — I10 Essential (primary) hypertension: Secondary | ICD-10-CM | POA: Diagnosis not present

## 2015-02-18 DIAGNOSIS — I10 Essential (primary) hypertension: Secondary | ICD-10-CM | POA: Diagnosis not present

## 2015-02-18 DIAGNOSIS — G35 Multiple sclerosis: Secondary | ICD-10-CM | POA: Diagnosis not present

## 2015-02-18 DIAGNOSIS — K592 Neurogenic bowel, not elsewhere classified: Secondary | ICD-10-CM | POA: Diagnosis not present

## 2015-02-18 DIAGNOSIS — E039 Hypothyroidism, unspecified: Secondary | ICD-10-CM | POA: Diagnosis not present

## 2015-02-18 DIAGNOSIS — N319 Neuromuscular dysfunction of bladder, unspecified: Secondary | ICD-10-CM | POA: Diagnosis not present

## 2015-02-18 DIAGNOSIS — I4891 Unspecified atrial fibrillation: Secondary | ICD-10-CM | POA: Diagnosis not present

## 2015-02-25 DIAGNOSIS — I4891 Unspecified atrial fibrillation: Secondary | ICD-10-CM | POA: Diagnosis not present

## 2015-02-25 DIAGNOSIS — G35 Multiple sclerosis: Secondary | ICD-10-CM | POA: Diagnosis not present

## 2015-02-25 DIAGNOSIS — E039 Hypothyroidism, unspecified: Secondary | ICD-10-CM | POA: Diagnosis not present

## 2015-02-25 DIAGNOSIS — I10 Essential (primary) hypertension: Secondary | ICD-10-CM | POA: Diagnosis not present

## 2015-02-25 DIAGNOSIS — K592 Neurogenic bowel, not elsewhere classified: Secondary | ICD-10-CM | POA: Diagnosis not present

## 2015-02-25 DIAGNOSIS — N319 Neuromuscular dysfunction of bladder, unspecified: Secondary | ICD-10-CM | POA: Diagnosis not present

## 2015-03-01 ENCOUNTER — Encounter: Payer: Self-pay | Admitting: Neurology

## 2015-03-01 ENCOUNTER — Ambulatory Visit (INDEPENDENT_AMBULATORY_CARE_PROVIDER_SITE_OTHER): Payer: Medicare Other | Admitting: Pharmacist Clinician (PhC)/ Clinical Pharmacy Specialist

## 2015-03-01 ENCOUNTER — Ambulatory Visit (INDEPENDENT_AMBULATORY_CARE_PROVIDER_SITE_OTHER): Payer: Medicare Other | Admitting: Neurology

## 2015-03-01 VITALS — BP 144/79 | HR 112

## 2015-03-01 DIAGNOSIS — I480 Paroxysmal atrial fibrillation: Secondary | ICD-10-CM

## 2015-03-01 DIAGNOSIS — Z7901 Long term (current) use of anticoagulants: Secondary | ICD-10-CM | POA: Diagnosis not present

## 2015-03-01 DIAGNOSIS — R269 Unspecified abnormalities of gait and mobility: Secondary | ICD-10-CM | POA: Diagnosis not present

## 2015-03-01 DIAGNOSIS — G35 Multiple sclerosis: Secondary | ICD-10-CM | POA: Diagnosis not present

## 2015-03-01 HISTORY — DX: Unspecified abnormalities of gait and mobility: R26.9

## 2015-03-01 LAB — POCT INR: INR: 2.1

## 2015-03-01 MED ORDER — BACLOFEN 40000 MCG/20ML IT SOLN
80000.0000 ug | Freq: Once | INTRATHECAL | Status: AC
  Administered 2015-03-01: 80000 ug via INTRATHECAL

## 2015-03-01 NOTE — Progress Notes (Signed)
Please refer to Baclofen pump refill note.

## 2015-03-01 NOTE — Procedures (Signed)
     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 She has a baclofen pump in place, and she comes in for a refill. The patient indicates that the spasticity worsens in the evening around 5 or 6 pm. She has been taking oral baclofen supplementation for this,  Baclofen pump refill note  The baclofen pump site was cleaned with Betadine solution. A 21-gauge needle was inserted into the pump port site. Approximately 6 cc of residual baclofen was removed, the pump indicates that 4.3 cc are remaining. 40 cc of replacement baclofen was placed into the pump at 2000 mcg/cc concentration.  The pump was reprogrammed for the following settings: basal rate of 28 micrograms/hr with a rate of 30.0 micrograms/hr from 15:00 to 20:00, and a rate of 24.8 micrograms/hr from 1:00 until 5:00 for a total of 665.9 micrograms a day.  The alarm volume is set at 1.5 cc. The next alarm date is 06/24/15.  The patient tolerated the procedure well. There were no complications of the above procedure.  The [place] number is [phone] The baclofen expiration date is 07/18. The baclofen lot number is [id number].

## 2015-03-02 ENCOUNTER — Telehealth: Payer: Self-pay | Admitting: Neurology

## 2015-03-02 NOTE — Telephone Encounter (Signed)
Pt called to inform us that she tried an "experiment" with her cpap last night. She held her breath to see if the air would increase. It did not. She says she is concerned that it is not working correctly. However, [facility] is no longer her DME because they don't accept Medicare. She is being transferred to [facility] but this will not be complete for two weeks. In the meantime, neither DME is able to provide a respiratory therapist to check her machine. She is asking if our sleep lab manager has thoughts or can check it for her.

## 2015-03-03 ENCOUNTER — Other Ambulatory Visit: Payer: Self-pay

## 2015-03-03 DIAGNOSIS — G4733 Obstructive sleep apnea (adult) (pediatric): Secondary | ICD-10-CM

## 2015-03-04 DIAGNOSIS — G35 Multiple sclerosis: Secondary | ICD-10-CM | POA: Diagnosis not present

## 2015-03-04 DIAGNOSIS — E039 Hypothyroidism, unspecified: Secondary | ICD-10-CM | POA: Diagnosis not present

## 2015-03-04 DIAGNOSIS — N319 Neuromuscular dysfunction of bladder, unspecified: Secondary | ICD-10-CM | POA: Diagnosis not present

## 2015-03-04 DIAGNOSIS — I4891 Unspecified atrial fibrillation: Secondary | ICD-10-CM | POA: Diagnosis not present

## 2015-03-04 DIAGNOSIS — I10 Essential (primary) hypertension: Secondary | ICD-10-CM | POA: Diagnosis not present

## 2015-03-04 DIAGNOSIS — K592 Neurogenic bowel, not elsewhere classified: Secondary | ICD-10-CM | POA: Diagnosis not present

## 2015-03-08 ENCOUNTER — Other Ambulatory Visit: Payer: Self-pay

## 2015-03-09 DIAGNOSIS — G35 Multiple sclerosis: Secondary | ICD-10-CM | POA: Diagnosis not present

## 2015-03-09 DIAGNOSIS — N958 Other specified menopausal and perimenopausal disorders: Secondary | ICD-10-CM | POA: Diagnosis not present

## 2015-03-09 DIAGNOSIS — R29890 Loss of height: Secondary | ICD-10-CM | POA: Diagnosis not present

## 2015-03-09 DIAGNOSIS — Z6835 Body mass index (BMI) 35.0-35.9, adult: Secondary | ICD-10-CM | POA: Diagnosis not present

## 2015-03-09 DIAGNOSIS — Z01419 Encounter for gynecological examination (general) (routine) without abnormal findings: Secondary | ICD-10-CM | POA: Diagnosis not present

## 2015-03-09 DIAGNOSIS — Z1212 Encounter for screening for malignant neoplasm of rectum: Secondary | ICD-10-CM | POA: Diagnosis not present

## 2015-03-11 DIAGNOSIS — E039 Hypothyroidism, unspecified: Secondary | ICD-10-CM | POA: Diagnosis not present

## 2015-03-11 DIAGNOSIS — I10 Essential (primary) hypertension: Secondary | ICD-10-CM | POA: Diagnosis not present

## 2015-03-11 DIAGNOSIS — I4891 Unspecified atrial fibrillation: Secondary | ICD-10-CM | POA: Diagnosis not present

## 2015-03-11 DIAGNOSIS — N319 Neuromuscular dysfunction of bladder, unspecified: Secondary | ICD-10-CM | POA: Diagnosis not present

## 2015-03-11 DIAGNOSIS — G35 Multiple sclerosis: Secondary | ICD-10-CM | POA: Diagnosis not present

## 2015-03-11 DIAGNOSIS — K592 Neurogenic bowel, not elsewhere classified: Secondary | ICD-10-CM | POA: Diagnosis not present

## 2015-03-18 DIAGNOSIS — K592 Neurogenic bowel, not elsewhere classified: Secondary | ICD-10-CM | POA: Diagnosis not present

## 2015-03-18 DIAGNOSIS — N319 Neuromuscular dysfunction of bladder, unspecified: Secondary | ICD-10-CM | POA: Diagnosis not present

## 2015-03-18 DIAGNOSIS — I4891 Unspecified atrial fibrillation: Secondary | ICD-10-CM | POA: Diagnosis not present

## 2015-03-18 DIAGNOSIS — I10 Essential (primary) hypertension: Secondary | ICD-10-CM | POA: Diagnosis not present

## 2015-03-18 DIAGNOSIS — E039 Hypothyroidism, unspecified: Secondary | ICD-10-CM | POA: Diagnosis not present

## 2015-03-18 DIAGNOSIS — G35 Multiple sclerosis: Secondary | ICD-10-CM | POA: Diagnosis not present

## 2015-03-24 NOTE — Telephone Encounter (Signed)
Pt has been referred to [facility]. I called [facility] to ensure pt had been set up. However, [facility] reported that they had the wrong number and haven't been able to get in touch with the pt. I provided the numbers that we have on file for the pt. [facility] said they would contact the pt today.

## 2015-03-25 ENCOUNTER — Other Ambulatory Visit: Payer: Self-pay

## 2015-03-25 ENCOUNTER — Telehealth: Payer: Self-pay | Admitting: Neurology

## 2015-03-25 DIAGNOSIS — Z5181 Encounter for therapeutic drug level monitoring: Secondary | ICD-10-CM

## 2015-03-25 NOTE — Telephone Encounter (Signed)
Pt is calling about labs Dr Brett Fairy wanted her to have done at PCP. She has lost the piece of paper (order) to have it done. She has appt on Wednesday. Can it be faxed to [facility] Dr Dagmar Hait

## 2015-03-25 NOTE — Telephone Encounter (Signed)
Spoke to pt and told her that I could fax an order to have CBC and CMET drawn at pt's appt with Dr. Dagmar Hait next week. I faxed order and received a confirmation fax. I requested the results from that draw be sent to [name] at [facility].

## 2015-03-26 ENCOUNTER — Telehealth: Payer: Self-pay | Admitting: Neurology

## 2015-03-26 DIAGNOSIS — R3912 Poor urinary stream: Secondary | ICD-10-CM | POA: Diagnosis not present

## 2015-03-26 DIAGNOSIS — N312 Flaccid neuropathic bladder, not elsewhere classified: Secondary | ICD-10-CM | POA: Diagnosis not present

## 2015-03-26 DIAGNOSIS — R3915 Urgency of urination: Secondary | ICD-10-CM | POA: Diagnosis not present

## 2015-03-26 NOTE — Telephone Encounter (Signed)
Pt feels like she is having an MS attack, can't urinate or have BM even though she needs to go, numbness and burning in hands, weaker in legs, really swollen

## 2015-03-29 ENCOUNTER — Ambulatory Visit (INDEPENDENT_AMBULATORY_CARE_PROVIDER_SITE_OTHER): Payer: Medicare Other | Admitting: Pharmacist Clinician (PhC)/ Clinical Pharmacy Specialist

## 2015-03-29 DIAGNOSIS — I480 Paroxysmal atrial fibrillation: Secondary | ICD-10-CM

## 2015-03-29 DIAGNOSIS — Z7901 Long term (current) use of anticoagulants: Secondary | ICD-10-CM

## 2015-03-29 LAB — POCT INR: INR: 2.4

## 2015-03-29 NOTE — Telephone Encounter (Signed)
Returned pt's phone call. She says that she has been having problems moving her bowels and bladder. She is also experiencing tightness/numbness/tingling in her legs. She went to a urologist on Friday and they checked her for urinary retention, but seemed to not think it was a problem at this time. She wants an appointment to discuss her medications. I made an appt for tomorrow 8/9 at 1:00 with the pt. Pt verbalized understanding to arrive 15 minutes early.

## 2015-03-30 ENCOUNTER — Ambulatory Visit
Admission: RE | Admit: 2015-03-30 | Discharge: 2015-03-30 | Disposition: A | Discharge status: Home / Self Care | Payer: Medicare Other | Source: Ambulatory Visit

## 2015-03-30 ENCOUNTER — Ambulatory Visit (INDEPENDENT_AMBULATORY_CARE_PROVIDER_SITE_OTHER): Payer: Medicare Other | Admitting: Neurology

## 2015-03-30 ENCOUNTER — Encounter: Payer: Self-pay | Admitting: Neurology

## 2015-03-30 VITALS — BP 104/60 | HR 68 | Resp 20

## 2015-03-30 DIAGNOSIS — E662 Morbid (severe) obesity with alveolar hypoventilation: Secondary | ICD-10-CM | POA: Insufficient documentation

## 2015-03-30 DIAGNOSIS — G3184 Mild cognitive impairment, so stated: Secondary | ICD-10-CM

## 2015-03-30 DIAGNOSIS — K5900 Constipation, unspecified: Secondary | ICD-10-CM

## 2015-03-30 DIAGNOSIS — G4737 Central sleep apnea in conditions classified elsewhere: Secondary | ICD-10-CM

## 2015-03-30 DIAGNOSIS — I509 Heart failure, unspecified: Secondary | ICD-10-CM | POA: Diagnosis not present

## 2015-03-30 DIAGNOSIS — Z1231 Encounter for screening mammogram for malignant neoplasm of breast: Secondary | ICD-10-CM

## 2015-03-30 DIAGNOSIS — G35 Multiple sclerosis: Secondary | ICD-10-CM | POA: Diagnosis not present

## 2015-03-30 DIAGNOSIS — G4733 Obstructive sleep apnea (adult) (pediatric): Secondary | ICD-10-CM

## 2015-03-30 DIAGNOSIS — Z9989 Dependence on other enabling machines and devices: Secondary | ICD-10-CM

## 2015-03-30 DIAGNOSIS — K5909 Other constipation: Secondary | ICD-10-CM

## 2015-03-30 MED ORDER — ALPRAZOLAM 0.5 MG PO TABS: 0.5000 mg | ORAL_TABLET | Freq: Two times a day (BID) | ORAL | Status: DC | PRN

## 2015-03-30 MED ORDER — CORTICOTROPIN 80 UNIT/ML IJ GEL: INTRAMUSCULAR | Status: DC

## 2015-03-30 MED ORDER — BACLOFEN 20 MG PO TABS: 20.0000 mg | ORAL_TABLET | Freq: Three times a day (TID) | ORAL | Status: DC | PRN

## 2015-03-30 MED ORDER — LACTULOSE 10 GM/15ML PO SOLN: ORAL | Status: DC

## 2015-03-30 NOTE — Addendum Note (Signed)
Addended by: Lester [place] A on: 03/30/2015 02:01 PM   Modules accepted: Orders

## 2015-03-30 NOTE — Progress Notes (Signed)
Provider:  [name], M D  Referring Provider: Prince Solian, MD Primary Care Physician:  [name], MD  Chief Complaint  Patient presents with  . 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 female seen here as a revisit for MS and OSA follow up. The patient is morbidly obese and has atrial fibrillation.  The patient has had her baclofen pump replaced in December 2015 and March 2016 . The pump refill went well. Dr [name] follows her  Before the pump was replaced, she had noted a higher and higher need for oral baclofen supplement.   She uses ACTH acthar  to avoid the previously steroid insuced a fib , she has less weight gain and less HTN on the ACTH. Her right leg is weaker than last year, but now suddently lost all feeling in the right leg , but she has not fallen, she remains wheelchair seated when in the office. She has been using protein powder supplements and her smoothies, she remains with significant ankle edema that I have also never known her without she has lost quadriceps muscle antebrachial muscles and finger and hand musculature as well she also reports sensory loss over the fingertips. Since the loss of sensory in her right leg came on rather quickly be considered her still a relapsing remitting MS.-she has a progressive declining course underlying.   As to her CPAP care she has [facility] which is new she switched from another DME. Over the last 30 days yet 100% compliance 8 hours and 36 minutes daily she is using the machine on average at 12.9 cm water pressure but her average AHI was 24.1 this was obtained before the change to a full face mask was made she just use the full facemask yesterday and she stated that she thought it all night. But she has habitually been a mouth breather and white her mouth drops open at  night and I would have rather liked her what it on a chinstrap with a nasal pillow the chinstrap has been tried to an failed. The machine has a minimum pressure setting of 10 and maximum pressure setting of 15 cm water with a 3 cm flex function. This auto titration revealed that she needs a slightly higher pressure window.   She has bowl and bladder dysfunction and hypothalamic problems with temperature regulation, she is almost a degree cooler in body temperature in AM  in comparison to PM.     01-25-15 Is until is here for routine visit today and she brought me a beautiful Gardenia blossom. Her sleepiness score is endorsed at 8 points fatigue severity 34, we have today also a download from her [employer] auto system.  Unfortunately the data very concerning. By the patient is compliant with 4 hours and 25 minutes of daily use and 96.7% compliance 29 out of 30 days. She has had a mean pressure of 12 cm water peak pressure of 14 cm water but an average AHI of 37.9. Ports that her nose is congested and often doesn't allow unhindered airflow and that she sometimes has to take the mask off for that reason. However this is in an extraordinary high AHI. Her machine doesn't differentiate between central and obstructives. She reports using nasal spray and allergy medication to prevent  rhinitis.   She will need to see ENT for this remaining nasal  obstruction.  I will order the machine to be autotration set 5-15 cm water .  Continues with nasal pillows, chin strap for oral air leakage.  DME [employer]; please allow her to change to a drreamwear interface and fit it.       Review of Systems: Out of a complete 14 system review, the patient complains of only the following symptoms, and all other reviewed systems are negative. Wheelchair bound.  Now back at home after rehab with PT> carpal tunnel.  Epworth 8 and FSS 34.  AHI too high.    History   Social History  . Marital Status: Married    Spouse Name: N/A   . Number of Children: 2  . Years of Education: COLLEGE   Occupational History  . RETIRED    Social History Main Topics  . Smoking status: Former Smoker    Quit date: 08/21/1976  . Smokeless tobacco: Never Used     Comment: QUIT 1978  . Alcohol Use: Yes     Comment: SOCIALLY  . Drug Use: No  . Sexual Activity: Yes   Other Topics Concern  . Not on file   Social History Narrative   Lives in [place] with Tillman Sers [phone], [phone]          Family History  Problem Relation Age of Onset  . Coronary artery disease Father     at age 33  . Coronary artery disease Maternal Grandmother   . Depression Maternal Grandmother   . Cancer Paternal Grandmother   . Depression Mother   . Alcoholism Son     Past Medical History  Diagnosis Date  . Multiple sclerosis     has had this may 1989-[facility] reg doc  . Hypertension   . PAF (paroxysmal atrial fibrillation)     on coumadin; documented on monitor 06/2010  . Thyroid disease   . HLD (hyperlipidemia)   . Shortness of breath   . Pneumonia   . Hyperthyroidism   . Edema     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' ablation of R GSV 2012  . Depression   . Anxiety   . Headache(784.0)     MIGRAINE   . Chronic pain   . Hx of echocardiogram 04/22/10    EF 55-60%, no valve issues  . History of stress test 06/21/10    limited exam with some degree of breast attenuatin of the apex however a component of apical ischemia is not excluded, EF 62%; cardiac cath   . Sleep apnea     cpap  . Abnormality of gait 03/01/2015    Past Surgical History  Procedure Laterality Date  . Cervical fusion      with correction-june 2005  . Infusion pump implantation      baclofen infusion in lower abd  . Venous ablation  12/16/10    radiofreq ablation -Dr Elisabeth Cara and [facility]  . Child births      X60  . Cardiac catheterization  06/22/10    normal coronary arteries, PAF  . Tonsillectomy    . Pain pump revision N/A 07/29/2014     Procedure: Baclofen pump replacement;  Surgeon: Erline Levine, MD;  Location: [facility];  Service: Neurosurgery;  Laterality: N/A;  Baclofen pump replacement    Current Outpatient Prescriptions  Medication Sig Dispense Refill  . ALPRAZolam (XANAX) 0.5 MG tablet Take 1 tablet (0.5  mg total) by mouth 2 (two) times daily as needed for anxiety. 180 tablet 1  . baclofen (LIORESAL) 20 MG tablet Take 1 tablet (20 mg total) by mouth 3 (three) times daily as needed for muscle spasms. 90 each 3  . buPROPion (WELLBUTRIN XL) 300 MG 24 hr tablet Take 300 mg by mouth daily.    . calcium carbonate (OS-CAL) 600 MG TABS tablet Take 600 mg by mouth 2 (two) times daily with a meal.    . cetirizine (ZYRTEC) 10 MG tablet Take 10 mg by mouth daily.    Marland Kitchen co-enzyme Q-10 30 MG capsule Take 30 mg by mouth daily.    . corticotropin (ATHACAR H.P.) 80 UNIT/ML injectable gel Changed to twice weekly Akhtar used 80 units. Catania's use. 10 mL 5  . docusate sodium (COLACE) 100 MG capsule Take 100 mg by mouth 2 (two) times daily.    . fluticasone (FLONASE) 50 MCG/ACT nasal spray Use as needed for allergies    . furosemide (LASIX) 40 MG tablet Take 40 mg by mouth in the morning and 20 mg (1/2 tablet) by mouth in evening. 135 tablet 0  . gabapentin (NEURONTIN) 300 MG capsule Take 300 mg by mouth. 3-4x/daily    . hyoscyamine (LEVSIN SL) 0.125 MG SL tablet DIS 1 T UNT BID  1  . lactulose (CHRONULAC) 10 GM/15ML solution 1-2tablespoons daily 240 mL 3  . levothyroxine (SYNTHROID, LEVOTHROID) 75 MCG tablet Take 75 mcg by mouth daily.    . Multiple Minerals-Vitamins (CALCIUM & VIT D3 BONE HEALTH PO) Take by mouth 2 (two) times daily. 1000/500mg     . Multiple Vitamin (MULITIVITAMIN WITH MINERALS) TABS Take 1 tablet by mouth daily.    . Nebivolol HCl 20 MG TABS Take 20 mg by mouth daily.     . Omega-3 Fatty Acids (FISH OIL) 600 MG CAPS Take 600 mg by mouth daily.     Marland Kitchen omeprazole (PRILOSEC) 20 MG capsule Take 20 mg by mouth daily.       . ranitidine (ZANTAC) 150 MG tablet Take 150 mg by mouth at bedtime.     . simvastatin (ZOCOR) 20 MG tablet Take 20 mg by mouth at bedtime.     . Tamsulosin HCl (FLOMAX) 0.4 MG CAPS Take 0.4 mg by mouth daily.    Marland Kitchen warfarin (COUMADIN) 5 MG tablet Take 1 tablet (5 mg total) by mouth daily. 90 tablet 3   No current facility-administered medications for this visit.    Allergies as of 03/30/2015 - Review Complete 03/30/2015  Allergen Reaction Noted  . Oxycodone  05/12/2013  . Chlorhexidine gluconate  11/26/2011  . Zoloft [sertraline] Hives, Swelling, and Rash 05/15/2013    Vitals: BP 104/60 mmHg  Pulse 68  Resp 20  Ht   Wt  Last Weight:  Wt Readings from Last 1 Encounters:  12/24/14 262 lb (118.842 kg)   Last Height:   Ht Readings from Last 1 Encounters:  02/01/15 5\' 2"  (1.575 m)    Physical exam:   Height ;  5.4 ", 260 pounds  General: The patient is awake, alert and appears not in acute distress. The patient is well groomed.  Head: Normocephalic, atraumatic. Neck is supple. Mallampati 4 neck circumference:19 inches .  Cardiovascular: Regular rate and rhythm today , without murmurs or carotid bruit, and without distended neck veins.  Respiratory: Lungs are clear to auscultation.  Skin: evidence of edema, not Rash, Injection site reaction.  Trunk: BMI is elevated. Neurologic exam :  The  patient is awake and alert, oriented to place and time.  Memory subjective described as impaired- we performed in detail [place] today. No flowsheet data found.  30-30  On 03-30-15   There is a normal attention span & concentration ability.  Speech without dysarthria, dysphonia , delayed wordfinding- aphasia.  Mood and affect are appropriate.   Cranial nerves:  Pupils are equal and briskly reactive to light. Extraocular movements in vertical and horizontal planes intact and without nystagmus.  Visual fields by finger perimetry are intact.  Hearing to finger rub intact. Facial sensation  intact to fine touch.  Facial motor strength is symmetric and tongue and uvula move midline.  Motor exam: Patient reports that she is losing dexterity in her right hand feels clumsy and also numbish. She reports a gloved feeling.  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  She reports trouble with spitting up fine objects like picking lint or picking up a pill. Numb fingers. Lost proprioception.   Decreased strength in all extremities.  Sensory: Fine touch, pinprick and vibration were restricted in the right, dominant hand, both feet Coordination:  He reports changing in her handwriting and trouble holding a pencil in her right him a dominant hand.  Rapid alternating movements in the fingers/hands is slow.  Gait and station wheelchair in office visits, but able to transfer to walker and  Can use walker or wheelchair outdoors. she doesn't need assistance to got to toilet or bed, transfers without help.    Deep tendon reflexes: in the upper and lower extremities are attenuated, right babinski positive- up going.   ASSESSMENT AND PLAN  69 y.o. year old female  With  Secondary relapsing progressive Multiple sclerosis; Hypertension; PAF (paroxysmal atrial fibrillation); Thyroid disease; obesity hypoventilation,   Pneumonia; Sleep apnea; Hyperthyroidism; Edema; Depression; Anxiety; Headache(784.0); Chronic pain; echocardiogram (04/22/10); and History of stress test (06/21/10) here with for routine followup of secondary  progressive MS with relapses. Wordfinding and naming delayed. Feels subjectivly Forgetful. MOCA was 30-30 !!  PLAN :  Continue ACTHAR 6 days a month  - she has atrial fibrillation again, concerned that she needs an ablation. Is acthar contributing to a fib, as did her steroid pulses?    The PT has noted improvement while on ACTHAR. She reports better sleep, better bowl control and bladder control. Continue Baclofen PUMP with Dr [name].  Keep next follow up  appointment alternating with me  in October 2016. 30 minutes  Please  Do not cancel appointments with Dr [name], those are seperate.   Please perform  MOCA once a year and leave this order under LOS.  She will have a blood draw August for  CBC and CMET / LFT HBa1c, with PCP. Dr Dagmar Hait.   Asencion Partridge Tymir Terral MD  03/30/2015

## 2015-03-30 NOTE — Patient Instructions (Addendum)
  69 y.o. year old female  With  Secondary relapsing progressive Multiple sclerosis; Hypertension; PAF (paroxysmal atrial fibrillation); Thyroid disease; obesity hypoventilation,   Pneumonia; Sleep apnea; Hyperthyroidism; Edema; Depression; Anxiety; Headache(784.0); Chronic pain; echocardiogram (04/22/10); and History of stress test (06/21/10) here with for CPAP and MS followup of secondary  progressive MS with relapses. Wordfinding and naming delayed. Forgetful.   PLAN :  Continue ACTHAR 6 days a month  - she has atrial fibrillation again, concerned that she needs an ablation. Is acthar contributing to a fib, as did her steroid pulses?    The PT has noted improvement while on ACTHAR. She reports better sleep, better bowl control and bladder control. Continue Baclofen PUMP with Dr [name].  Keep next follow up appointment alternating with me  in October 2016. 30 minutes  Please  Do not cancel appointments with Dr [name], those are seperate.    MOCA a was 30-30 She will have a blood draw this  August for  CBC and CMET / LFT HBa1c, with PCP. Dr Dagmar Hait.  The blood tests are necessary to be drawn with Dr. Elsworth Soho prior to the MRI which will use some contrast. We will do a brain and cervical spine. I do suspect a cervical spine demyelinating lesion because of her extremity involvement her hands progressive numbness and loss of grip strengths. The patient advised me that she is claustrophobic and requests a open MRI she does have some Xanax at home that she can premedicate with if needed.

## 2015-03-31 DIAGNOSIS — R7301 Impaired fasting glucose: Secondary | ICD-10-CM | POA: Diagnosis not present

## 2015-03-31 DIAGNOSIS — F418 Other specified anxiety disorders: Secondary | ICD-10-CM | POA: Diagnosis not present

## 2015-03-31 DIAGNOSIS — N3289 Other specified disorders of bladder: Secondary | ICD-10-CM | POA: Diagnosis not present

## 2015-03-31 DIAGNOSIS — G35 Multiple sclerosis: Secondary | ICD-10-CM | POA: Diagnosis not present

## 2015-03-31 DIAGNOSIS — G4733 Obstructive sleep apnea (adult) (pediatric): Secondary | ICD-10-CM | POA: Diagnosis not present

## 2015-03-31 DIAGNOSIS — I10 Essential (primary) hypertension: Secondary | ICD-10-CM | POA: Diagnosis not present

## 2015-03-31 DIAGNOSIS — E039 Hypothyroidism, unspecified: Secondary | ICD-10-CM | POA: Diagnosis not present

## 2015-04-01 DIAGNOSIS — N319 Neuromuscular dysfunction of bladder, unspecified: Secondary | ICD-10-CM | POA: Diagnosis not present

## 2015-04-01 DIAGNOSIS — G35 Multiple sclerosis: Secondary | ICD-10-CM | POA: Diagnosis not present

## 2015-04-01 DIAGNOSIS — E039 Hypothyroidism, unspecified: Secondary | ICD-10-CM | POA: Diagnosis not present

## 2015-04-01 DIAGNOSIS — I10 Essential (primary) hypertension: Secondary | ICD-10-CM | POA: Diagnosis not present

## 2015-04-01 DIAGNOSIS — I4891 Unspecified atrial fibrillation: Secondary | ICD-10-CM | POA: Diagnosis not present

## 2015-04-01 DIAGNOSIS — K592 Neurogenic bowel, not elsewhere classified: Secondary | ICD-10-CM | POA: Diagnosis not present

## 2015-04-02 ENCOUNTER — Telehealth: Payer: Self-pay | Admitting: Neurology

## 2015-04-02 NOTE — Telephone Encounter (Signed)
Barnabas Lister / [facility] called wondering if Outpt Rehab at [facility] would be more beneficial to patient vs Home maintenance

## 2015-04-05 NOTE — Telephone Encounter (Signed)
Patient is returning your call.  

## 2015-04-05 NOTE — Telephone Encounter (Signed)
Called to ask pt if she feels that home health care is beneficial to her, or whether she would like to do outpt rehab at neuro [facility]. Transportation and mobility are concerns Dr. Brett Fairy has for why the pt should keep doing PT at home.  Asked man who answered the phone to ask pt to call me back at her convenience.

## 2015-04-05 NOTE — Telephone Encounter (Signed)
Spoke to pt. She is going to inquire whether she can still do her home maintenance therapy with [facility]. She says she would like to speak with Barnabas Lister at [facility] before she decides whether to do the neuro rehab or not. She said she would call us back.

## 2015-04-06 NOTE — Telephone Encounter (Signed)
Spoke with Toni [name]. He was just wanting me to know that he will continue the maintenance physical therapy once a week for 9 weeks. This is what the pt wanted.

## 2015-04-06 NOTE — Telephone Encounter (Signed)
[facility], Barnabas Lister, called and states the pt would like to continue to have home care. Maintenance  physical therapy, once a week for 9 weeks. Barnabas Lister [phone] may leave message.

## 2015-04-08 DIAGNOSIS — I10 Essential (primary) hypertension: Secondary | ICD-10-CM | POA: Diagnosis not present

## 2015-04-08 DIAGNOSIS — E039 Hypothyroidism, unspecified: Secondary | ICD-10-CM | POA: Diagnosis not present

## 2015-04-08 DIAGNOSIS — K592 Neurogenic bowel, not elsewhere classified: Secondary | ICD-10-CM | POA: Diagnosis not present

## 2015-04-08 DIAGNOSIS — I4891 Unspecified atrial fibrillation: Secondary | ICD-10-CM | POA: Diagnosis not present

## 2015-04-08 DIAGNOSIS — N319 Neuromuscular dysfunction of bladder, unspecified: Secondary | ICD-10-CM | POA: Diagnosis not present

## 2015-04-08 DIAGNOSIS — G35 Multiple sclerosis: Secondary | ICD-10-CM | POA: Diagnosis not present

## 2015-04-10 DIAGNOSIS — E039 Hypothyroidism, unspecified: Secondary | ICD-10-CM | POA: Diagnosis not present

## 2015-04-10 DIAGNOSIS — Z7901 Long term (current) use of anticoagulants: Secondary | ICD-10-CM | POA: Diagnosis not present

## 2015-04-10 DIAGNOSIS — K592 Neurogenic bowel, not elsewhere classified: Secondary | ICD-10-CM | POA: Diagnosis not present

## 2015-04-10 DIAGNOSIS — N319 Neuromuscular dysfunction of bladder, unspecified: Secondary | ICD-10-CM | POA: Diagnosis not present

## 2015-04-10 DIAGNOSIS — I10 Essential (primary) hypertension: Secondary | ICD-10-CM | POA: Diagnosis not present

## 2015-04-10 DIAGNOSIS — G35 Multiple sclerosis: Secondary | ICD-10-CM | POA: Diagnosis not present

## 2015-04-10 DIAGNOSIS — E785 Hyperlipidemia, unspecified: Secondary | ICD-10-CM | POA: Diagnosis not present

## 2015-04-10 DIAGNOSIS — I4891 Unspecified atrial fibrillation: Secondary | ICD-10-CM | POA: Diagnosis not present

## 2015-04-13 ENCOUNTER — Telehealth: Payer: Self-pay | Admitting: Neurology

## 2015-04-13 DIAGNOSIS — G35 Multiple sclerosis: Secondary | ICD-10-CM

## 2015-04-13 DIAGNOSIS — N319 Neuromuscular dysfunction of bladder, unspecified: Secondary | ICD-10-CM

## 2015-04-13 NOTE — Telephone Encounter (Signed)
I called the patient. She wondered if it would be okay to have one of the representatives from [facility] check her baclofen pump after the MRI, since Dr. [name] will be out of the office. I told her I would double check with Dr. [name] and if it was okay I would call to schedule a representative to come out. I told her I would give her a call back tomorrow with a plan.

## 2015-04-13 NOTE — Telephone Encounter (Signed)
Patient is calling as she is having an MRI on 8/25 @11 :00 and needs to have her baclofen pump checked afterward.  I see that Dr. [name] will not be here on 8/25.  Please call the patient with options.  Thanks!

## 2015-04-14 NOTE — Telephone Encounter (Signed)
Happy to check the baclofen pump after MRI, but understand the MRI was cancelled. The patient was told  She cannot have an open MRI with a baclofen pump- She had an open MRI before , but not sure if this was with baclofen pump. CT brain  And cervical spine ordered instead.

## 2015-04-14 NOTE — Telephone Encounter (Signed)
Spoke to Toni [name] at [facility]. Since pt has a baclofen pump, they cannot perform the MRI in their open scanner. They must complete it in a small, closed scanner. Pt was notified of this and refused since she is severely claustrophobic.  Spoke to pt who verified this. She wants to know what Dr. Brett Fairy recommends to do since she cannot have the MRIs completed. I advised pt that I would ask Dr. Brett Fairy and either she or I would call her back.

## 2015-04-14 NOTE — Telephone Encounter (Signed)
Olin Hauser is calling from [facility] regarding the patient. She states they are not able to do the MRI. Please call to discuss. Thank you.

## 2015-04-14 NOTE — Telephone Encounter (Signed)
Returned Pamela's phone call, left a message asking her to call me back. Please skype me when she calls back.

## 2015-04-15 ENCOUNTER — Other Ambulatory Visit: Payer: Medicare Other

## 2015-04-15 ENCOUNTER — Inpatient Hospital Stay: Admission: RE | Admit: 2015-04-15 | Payer: Medicare Other | Source: Ambulatory Visit

## 2015-04-15 DIAGNOSIS — N319 Neuromuscular dysfunction of bladder, unspecified: Secondary | ICD-10-CM | POA: Diagnosis not present

## 2015-04-15 DIAGNOSIS — I10 Essential (primary) hypertension: Secondary | ICD-10-CM | POA: Diagnosis not present

## 2015-04-15 DIAGNOSIS — G35 Multiple sclerosis: Secondary | ICD-10-CM | POA: Diagnosis not present

## 2015-04-15 DIAGNOSIS — K592 Neurogenic bowel, not elsewhere classified: Secondary | ICD-10-CM | POA: Diagnosis not present

## 2015-04-15 DIAGNOSIS — I4891 Unspecified atrial fibrillation: Secondary | ICD-10-CM | POA: Diagnosis not present

## 2015-04-15 DIAGNOSIS — E039 Hypothyroidism, unspecified: Secondary | ICD-10-CM | POA: Diagnosis not present

## 2015-04-21 DIAGNOSIS — G35 Multiple sclerosis: Secondary | ICD-10-CM | POA: Diagnosis not present

## 2015-04-21 DIAGNOSIS — E039 Hypothyroidism, unspecified: Secondary | ICD-10-CM | POA: Diagnosis not present

## 2015-04-21 DIAGNOSIS — I4891 Unspecified atrial fibrillation: Secondary | ICD-10-CM | POA: Diagnosis not present

## 2015-04-21 DIAGNOSIS — N319 Neuromuscular dysfunction of bladder, unspecified: Secondary | ICD-10-CM | POA: Diagnosis not present

## 2015-04-21 DIAGNOSIS — K592 Neurogenic bowel, not elsewhere classified: Secondary | ICD-10-CM | POA: Diagnosis not present

## 2015-04-21 DIAGNOSIS — I10 Essential (primary) hypertension: Secondary | ICD-10-CM | POA: Diagnosis not present

## 2015-04-22 ENCOUNTER — Ambulatory Visit
Admission: RE | Admit: 2015-04-22 | Discharge: 2015-04-22 | Disposition: A | Discharge status: Home / Self Care | Payer: Medicare Other | Source: Ambulatory Visit | Attending: Neurology | Admitting: Neurology

## 2015-04-22 ENCOUNTER — Other Ambulatory Visit: Payer: Self-pay

## 2015-04-22 DIAGNOSIS — G35 Multiple sclerosis: Secondary | ICD-10-CM

## 2015-04-22 DIAGNOSIS — N319 Neuromuscular dysfunction of bladder, unspecified: Secondary | ICD-10-CM

## 2015-04-22 DIAGNOSIS — M4322 Fusion of spine, cervical region: Secondary | ICD-10-CM | POA: Diagnosis not present

## 2015-04-22 MED ORDER — IOPAMIDOL (ISOVUE-300) INJECTION 61%
75.0000 mL | Freq: Once | INTRAVENOUS | Status: AC | PRN
  Administered 2015-04-22: 75 mL via INTRAVENOUS

## 2015-04-22 NOTE — Progress Notes (Signed)
Received verbal order from Dr. Brett Fairy to change CT head wo contrast to CT head w/wo contrast per [facility] request.

## 2015-04-22 NOTE — Progress Notes (Signed)
Marti from  [facility] called wanting to know if Dr. Brett Fairy would consider changing pt's ct cervical spine to with contrast instead of w/wo contrast to avoid double the radiation. I spoke to Dr. Brett Fairy and she said this was ok. Order placed and Marti at [facility] imaging notified.

## 2015-04-27 ENCOUNTER — Telehealth: Payer: Self-pay | Admitting: *Deleted

## 2015-04-27 DIAGNOSIS — G35 Multiple sclerosis: Secondary | ICD-10-CM | POA: Diagnosis not present

## 2015-04-27 NOTE — Telephone Encounter (Signed)
See previous note, I relayed that gave results to both CT head and cervical.

## 2015-04-27 NOTE — Telephone Encounter (Signed)
-----   Message from [name], MD sent at 04/27/2015  1:12 PM EDT ----- Progression of white matter lesion in the brain, in comp to 2011. We should continue on MS medication.

## 2015-04-27 NOTE — Telephone Encounter (Signed)
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 Patient could not tolerate closed MRI , and open MRI could not accommodate the baclofen pump.

## 2015-04-27 NOTE — Telephone Encounter (Signed)
I called and spoke to pt about both her CT brain and cervical spine results.  She is speaking to [facility] about MRI (open) being done back in 2011.  This is under investigation due to (she could not have closed MRI and has baclofen pump).

## 2015-04-29 DIAGNOSIS — K592 Neurogenic bowel, not elsewhere classified: Secondary | ICD-10-CM | POA: Diagnosis not present

## 2015-04-29 DIAGNOSIS — N319 Neuromuscular dysfunction of bladder, unspecified: Secondary | ICD-10-CM | POA: Diagnosis not present

## 2015-04-29 DIAGNOSIS — I4891 Unspecified atrial fibrillation: Secondary | ICD-10-CM | POA: Diagnosis not present

## 2015-04-29 DIAGNOSIS — I10 Essential (primary) hypertension: Secondary | ICD-10-CM | POA: Diagnosis not present

## 2015-04-29 DIAGNOSIS — G35 Multiple sclerosis: Secondary | ICD-10-CM | POA: Diagnosis not present

## 2015-04-29 DIAGNOSIS — E039 Hypothyroidism, unspecified: Secondary | ICD-10-CM | POA: Diagnosis not present

## 2015-05-03 ENCOUNTER — Ambulatory Visit (INDEPENDENT_AMBULATORY_CARE_PROVIDER_SITE_OTHER): Payer: Medicare Other | Admitting: Pharmacist Clinician (PhC)/ Clinical Pharmacy Specialist

## 2015-05-03 DIAGNOSIS — Z7901 Long term (current) use of anticoagulants: Secondary | ICD-10-CM

## 2015-05-03 DIAGNOSIS — I480 Paroxysmal atrial fibrillation: Secondary | ICD-10-CM

## 2015-05-03 LAB — POCT INR: INR: 2

## 2015-05-04 DIAGNOSIS — M25532 Pain in left wrist: Secondary | ICD-10-CM | POA: Diagnosis not present

## 2015-05-06 DIAGNOSIS — I10 Essential (primary) hypertension: Secondary | ICD-10-CM | POA: Diagnosis not present

## 2015-05-06 DIAGNOSIS — E039 Hypothyroidism, unspecified: Secondary | ICD-10-CM | POA: Diagnosis not present

## 2015-05-06 DIAGNOSIS — I4891 Unspecified atrial fibrillation: Secondary | ICD-10-CM | POA: Diagnosis not present

## 2015-05-06 DIAGNOSIS — N319 Neuromuscular dysfunction of bladder, unspecified: Secondary | ICD-10-CM | POA: Diagnosis not present

## 2015-05-06 DIAGNOSIS — G35 Multiple sclerosis: Secondary | ICD-10-CM | POA: Diagnosis not present

## 2015-05-06 DIAGNOSIS — K592 Neurogenic bowel, not elsewhere classified: Secondary | ICD-10-CM | POA: Diagnosis not present

## 2015-05-10 ENCOUNTER — Other Ambulatory Visit: Payer: Self-pay | Admitting: Pharmacist Clinician (PhC)/ Clinical Pharmacy Specialist

## 2015-05-10 MED ORDER — WARFARIN SODIUM 5 MG PO TABS: ORAL_TABLET | ORAL | Status: DC

## 2015-05-10 NOTE — Telephone Encounter (Signed)
I called the patient. She needed to schedule an appointment to have her baclofen pump refilled. Alarm date 06/24/15. Appointment scheduled 06/21/15.

## 2015-05-10 NOTE — Telephone Encounter (Signed)
Patient called requesting to speak to [facility] regarding Baclofen Pump.

## 2015-05-13 DIAGNOSIS — K592 Neurogenic bowel, not elsewhere classified: Secondary | ICD-10-CM | POA: Diagnosis not present

## 2015-05-13 DIAGNOSIS — I10 Essential (primary) hypertension: Secondary | ICD-10-CM | POA: Diagnosis not present

## 2015-05-13 DIAGNOSIS — I4891 Unspecified atrial fibrillation: Secondary | ICD-10-CM | POA: Diagnosis not present

## 2015-05-13 DIAGNOSIS — N319 Neuromuscular dysfunction of bladder, unspecified: Secondary | ICD-10-CM | POA: Diagnosis not present

## 2015-05-13 DIAGNOSIS — G35 Multiple sclerosis: Secondary | ICD-10-CM | POA: Diagnosis not present

## 2015-05-13 DIAGNOSIS — E039 Hypothyroidism, unspecified: Secondary | ICD-10-CM | POA: Diagnosis not present

## 2015-05-18 DIAGNOSIS — R351 Nocturia: Secondary | ICD-10-CM | POA: Diagnosis not present

## 2015-05-18 DIAGNOSIS — R3915 Urgency of urination: Secondary | ICD-10-CM | POA: Diagnosis not present

## 2015-05-27 DIAGNOSIS — I10 Essential (primary) hypertension: Secondary | ICD-10-CM | POA: Diagnosis not present

## 2015-05-27 DIAGNOSIS — G35 Multiple sclerosis: Secondary | ICD-10-CM | POA: Diagnosis not present

## 2015-05-27 DIAGNOSIS — I4891 Unspecified atrial fibrillation: Secondary | ICD-10-CM | POA: Diagnosis not present

## 2015-05-27 DIAGNOSIS — E039 Hypothyroidism, unspecified: Secondary | ICD-10-CM | POA: Diagnosis not present

## 2015-05-27 DIAGNOSIS — N319 Neuromuscular dysfunction of bladder, unspecified: Secondary | ICD-10-CM | POA: Diagnosis not present

## 2015-05-27 DIAGNOSIS — K592 Neurogenic bowel, not elsewhere classified: Secondary | ICD-10-CM | POA: Diagnosis not present

## 2015-05-31 ENCOUNTER — Ambulatory Visit: Payer: Medicare Other | Admitting: Pharmacist Clinician (PhC)/ Clinical Pharmacy Specialist

## 2015-05-31 ENCOUNTER — Telehealth: Payer: Self-pay | Admitting: Neurology

## 2015-05-31 ENCOUNTER — Ambulatory Visit (INDEPENDENT_AMBULATORY_CARE_PROVIDER_SITE_OTHER): Payer: Medicare Other | Admitting: Pharmacist Clinician (PhC)/ Clinical Pharmacy Specialist

## 2015-05-31 DIAGNOSIS — Z7901 Long term (current) use of anticoagulants: Secondary | ICD-10-CM | POA: Diagnosis not present

## 2015-05-31 DIAGNOSIS — I480 Paroxysmal atrial fibrillation: Secondary | ICD-10-CM

## 2015-05-31 LAB — POCT INR: INR: 2.5

## 2015-05-31 NOTE — Telephone Encounter (Signed)
Toni [name], [facility] called and states her certification will end and they want to know if she can be re-certified for maintiance  PT home health.  [phone]

## 2015-06-01 ENCOUNTER — Telehealth: Payer: Self-pay | Admitting: Neurology

## 2015-06-01 NOTE — Telephone Encounter (Signed)
Noted! Thank you

## 2015-06-01 NOTE — Telephone Encounter (Signed)
Dr. Brett Fairy gave verbal order to continue pt's PT home health. I advised Barnabas Lister from [facility], Barnabas Lister verbalized understanding.

## 2015-06-01 NOTE — Telephone Encounter (Signed)
Patient called to advise CPAP report has been downloaded into the system by [facility], you can now access.

## 2015-06-02 ENCOUNTER — Other Ambulatory Visit: Payer: Self-pay | Admitting: Neurology

## 2015-06-02 ENCOUNTER — Ambulatory Visit (INDEPENDENT_AMBULATORY_CARE_PROVIDER_SITE_OTHER): Payer: Medicare Other | Admitting: Neurology

## 2015-06-02 ENCOUNTER — Encounter: Payer: Self-pay | Admitting: Neurology

## 2015-06-02 VITALS — BP 130/90 | HR 88 | Resp 20

## 2015-06-02 DIAGNOSIS — I509 Heart failure, unspecified: Secondary | ICD-10-CM | POA: Diagnosis not present

## 2015-06-02 DIAGNOSIS — I4819 Other persistent atrial fibrillation: Secondary | ICD-10-CM

## 2015-06-02 DIAGNOSIS — I481 Persistent atrial fibrillation: Secondary | ICD-10-CM

## 2015-06-02 DIAGNOSIS — G35 Multiple sclerosis: Secondary | ICD-10-CM | POA: Diagnosis not present

## 2015-06-02 DIAGNOSIS — Z79899 Other long term (current) drug therapy: Secondary | ICD-10-CM | POA: Insufficient documentation

## 2015-06-02 DIAGNOSIS — G4737 Central sleep apnea in conditions classified elsewhere: Secondary | ICD-10-CM | POA: Diagnosis not present

## 2015-06-02 MED ORDER — LACTULOSE 20 GM/30ML PO SOLN: 5.0000 g | Freq: Two times a day (BID) | ORAL | Status: DC

## 2015-06-02 MED ORDER — CORTICOTROPIN 80 UNIT/ML IJ GEL: INTRAMUSCULAR | Status: DC

## 2015-06-02 NOTE — Patient Instructions (Signed)
Corticotropin, ACTH Gel for Injection What is this medicine? CORTICOTROPIN (kawr ti [place] pin) is a hormone that occurs naturally in the body. It is used as a diagnostic aid to test the adrenal glands. It is used in children less than 68 years old to treat infantile spasms. It is also used to treat problems of the eyes, joints, lungs, nervous system, skin, thyroid, and others. This medicine may be used for other purposes; ask your health care provider or pharmacist if you have questions. What should I tell my health care provider before I take this medicine? They need to know if you have any of these conditions: -adrenal gland disease -heart failure -high blood pressure -infection; -kidney disease -liver disease -osteoporosis -peptic ulcer -recent surgery -scleroderma -thyroid disease -an unusual or allergic reaction to corticotropin, corticosteroids, pork proteins, other medicines, foods, dyes, or preservatives -pregnant or trying to get pregnant -breast-feeding How should I use this medicine? In a diagnostic procedure, this medicine is for injection into a vein. It is given by a health care professional in a hospital or clinic setting. For other treatments, this medicine is for injection into a muscle or under the skin. You will be taught how to prepare and give this medicine. Use exactly as directed. Take your medicine at regular intervals. Do not take your medicine more often than directed. It is important that you put your used needles and syringes in a special sharps container. Do not put them in a trash can. If you do not have a sharps container, call your pharmacist or healthcare provider to get one. If you are using this medicine to treat infantile spasms, a special MedGuide will be given to you by the pharmacist with each prescription and refill. Be sure to read this information carefully each time. Talk to your pediatrician regarding the use of this medicine in children. While  this drug may be prescribed for children as young as 1 month for selected conditions, precautions do apply. Overdosage: If you think you have taken too much of this medicine contact a poison control center or emergency room at once. NOTE: This medicine is only for you. Do not share this medicine with others. What if I miss a dose? In a diagnostic procedure, this does not apply. For other treatments, if you miss a dose, take it as soon as you can. If it is almost time for your next dose, take only that dose. Do not take double or extra doses. What may interact with this medicine? Do not take this medicine with any of the following medications: -mifepristone This medicine may also interact with the following medications: -diuretics -ritodrine -vaccines This list may not describe all possible interactions. Give your health care provider a list of all the medicines, herbs, non-prescription drugs, or dietary supplements you use. Also tell them if you smoke, drink alcohol, or use illegal drugs. Some items may interact with your medicine. What should I watch for while using this medicine? Visit your doctor for regular check ups. Tell your doctor or healthcare professional if your symptoms do not start to get better or if they get worse. If you are taking this medicine for a long time, carry an identification card with your name, address, the type and dose of your medicine, and your doctor's name and address. Stay away from people who are sick. Tell your doctor or health care professional if you are exposed to anyone with measles or chickenpox, or if you develop sores or blisters that  do not heal properly. Do not receive any vaccinations as you may get a strong reaction. Avoid people who have recently taken oral polio vaccine. If you are going to have surgery, tell your doctor or health care professional that you have received this medicine within the last twelve months. What side effects may I notice  from receiving this medicine? Side effects that you should report to your doctor or health care professional as soon as possible: -allergic reactions like skin rash, itching or hives, swelling of the face, lips, or tongue -black, tarry stools -changes in vision -change in the amount of urine -confusion -fever, sore throat, or other signs of infection -hallucinations -increased thirst -irregular heartbeat -mental depression, mood swings -menstrual problems -muscle cramps -nausea, vomiting -pain in hips, back, ribs, arms, shoulders, or legs -rounding out of face -skin problems, acne, thin and shiny skin -stomach pain -swelling of feet or lower legs -unusual bleeding or bruising -unusually weak or tired -weight gain Side effects that usually do not require medical attention (report to your doctor or health care professional if they continue or are bothersome): -increased appetite -nervousness, restlessness, or difficulty sleeping -stomach upset -unusual increased growth of hair on the face or body This list may not describe all possible side effects. Call your doctor for medical advice about side effects. You may report side effects to FDA at 1-800-FDA-1088. Where should I keep my medicine? Keep out of the reach of children. If you are using this medicine at home, you will be instructed on how to store this medicine. Throw away any unused medicine after the expiration date on the label. NOTE: This sheet is a summary. It may not cover all possible information. If you have questions about this medicine, talk to your doctor, pharmacist, or health care provider.    2016, Elsevier/Gold Standard. (2009-06-18 11:21:53)

## 2015-06-02 NOTE — Progress Notes (Signed)
Provider:  [name], M D  Referring Provider: Prince Solian, MD Primary Care Physician:  [name], MD  Chief Complaint  Patient presents with  . Follow-up    MS, memory, cpap, rm 10, alone    HPI:  Toni [name] is a 69 y.o. female seen here as a revisit for MS follow up.   The patient has had her baclofen pump replaced in December 2015 and March 2016 . The pump refill went well.  Before the pump was replaced, she had noted a higher and higher need for oral baclofen supplement. She uses ACTH to avoid the previously steroid insuced a fib , she has less weight gain and less HTN on the ACTH. Her right leg is weaker than last year, but she has not fallen. She is her for a routine visit , has no acute questions.  Her extensive history is re viewed in previous noted.   01-25-15 Is until is here for routine visit today and she brought me a beautiful Gardenia blossom. Her sleepiness score is endorsed at 8 points fatigue severity 34, we have today also a download from her [employer] auto system.   Unfortunately the data very concerning. By the patient is compliant with 4 hours and 25 minutes of daily use and 96.7% compliance 29 out of 30 days. She has had a mean pressure of 12 cm water peak pressure of 14 cm water but an average AHI of 37.9.  reports that her nose is congested and often doesn't allow unhindered airflow and that she sometimes has to take the mask off for that reason.  However this is in an extraordinary high AHI. Her machine doesn't differentiate between central and obstructives.  She reports using nasal spray and allergy medication to prevent rhinitis.  She will need to see ENT for this remaining nasal obstruction.  I will order the machine to be autotration set 5-15 cm water .  Continues with nasal pillows, chin strap for oral air leakage.     06-02-15 OSA : Now with [facility], where she was given an AIRFIT F 10- a FFM.  She had a lot of air leaking by  using a nasal pillow, but because of her dexterity problems in relation to MS it is much harder for her to use a full face mask with multiple straps buttons and release clips. I would like for her to return to whatever mask she can manipulate by herself the easiest, this would not be a dream wear , nor a FFM .   We will ignore the air leaks!    Has a home pulseoximetry at home, she has sufficient 02 levels.   Neurogenic bladder :She still wakes up at night.-She wakes up every 2 hours with urinary bladder spasms. The patient was discussed prescribed hyoscyamine by her urologist, Dr. Gaynelle Arabian, but then the medication was recalled. She did not feel however for the short time that she used it but it gave her much benefit. She also has samples of scopolamine now at home she reports. She was also offered bladder spasm treatments with Botox but since she has to cath herself in order to undergo that treatment she is worried that her current dexterity would not allow for sufficient self-catheterization.  MS; [name] fine motor skills and ambulatory skills have significantly declined over the last 5 years she is now mostly seen here in a wheelchair but she can transfer. She continues to use the baclofen pump which  is followed by Dr. [name]. Cognitive decline was a concern but today's [place] cognitive assessment test showed 30 out of 30 points and word fluency of 13 words. She also endorsed the Epworth sleepiness score at 11 points and the fatigue severity score was not endorsed.  Odema: severe ankle edema and veinous insufficiency. On diuretics. Not using compression stockings.  She has anasarca when she wakes first in the morning. She has low albumin. She takes a protein rich diet. She is losing muscle mass.   Osteopenia. Arthritis.   PT once a week for mobility maintenance. I will renew her prescription.     Review of Systems: Out of a complete 14 system review, the patient complains of only the  following symptoms, and all other reviewed systems are negative. Wheelchair bound.  Now back at home after rehab with PT> carpal tunnel.  Epworth 8 and FSS 34.  Social History   Social History  . Marital Status: Married    Spouse Name: N/A  . Number of Children: 2  . Years of Education: COLLEGE   Occupational History  . RETIRED    Social History Main Topics  . Smoking status: Former Smoker    Quit date: 08/21/1976  . Smokeless tobacco: Never Used     Comment: QUIT 1978  . Alcohol Use: Yes     Comment: SOCIALLY  . Drug Use: No  . Sexual Activity: Yes   Other Topics Concern  . Not on file   Social History Narrative   Lives in [place] with Toni [name] [phone], [phone]          Family History  Problem Relation Age of Onset  . Coronary artery disease Father     at age 54  . Coronary artery disease Maternal Grandmother   . Depression Maternal Grandmother   . Cancer Paternal Grandmother   . Depression Mother   . Alcoholism Son     Past Medical History  Diagnosis Date  . Multiple sclerosis ([place])     has had this may 1989-[facility] reg doc  . Hypertension   . PAF (paroxysmal atrial fibrillation) ([place])     on coumadin; documented on monitor 06/2010  . Thyroid disease   . HLD (hyperlipidemia)   . Shortness of breath   . Pneumonia   . Hyperthyroidism   . Edema     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' ablation of R GSV 2012  . Depression   . Anxiety   . Headache(784.0)     MIGRAINE   . Chronic pain   . Hx of echocardiogram 04/22/10    EF 55-60%, no valve issues  . History of stress test 06/21/10    limited exam with some degree of breast attenuatin of the apex however a component of apical ischemia is not excluded, EF 62%; cardiac cath   . Sleep apnea     cpap  . Abnormality of gait 03/01/2015    Past Surgical History  Procedure Laterality Date  . Cervical fusion      with correction-june 2005  . Infusion pump implantation      baclofen  infusion in lower abd  . Venous ablation  12/16/10    radiofreq ablation -Dr Elisabeth Cara and [facility]  . Child births      X43  . Cardiac catheterization  06/22/10    normal coronary arteries, PAF  . Tonsillectomy    . Pain pump revision N/A 07/29/2014    Procedure: Baclofen  pump replacement;  Surgeon: Erline Levine, MD;  Location: [facility];  Service: Neurosurgery;  Laterality: N/A;  Baclofen pump replacement    Current Outpatient Prescriptions  Medication Sig Dispense Refill  . ALPRAZolam (XANAX) 0.5 MG tablet Take 1 tablet (0.5 mg total) by mouth 2 (two) times daily as needed for anxiety. 180 tablet 1  . baclofen (LIORESAL) 20 MG tablet Take 1 tablet (20 mg total) by mouth 3 (three) times daily as needed for muscle spasms. 180 each 3  . buPROPion (WELLBUTRIN XL) 300 MG 24 hr tablet Take 300 mg by mouth daily.    . calcium carbonate (OS-CAL) 600 MG TABS tablet Take 600 mg by mouth 2 (two) times daily with a meal.    . cetirizine (ZYRTEC) 10 MG tablet Take 10 mg by mouth daily.    Marland Kitchen co-enzyme Q-10 30 MG capsule Take 30 mg by mouth daily.    . corticotropin (ATHACAR H.P.) 80 UNIT/ML injectable gel 8 injections per month (two per week). Subcutaneous injection 10 mL 5  . fluticasone (FLONASE) 50 MCG/ACT nasal spray Use as needed for allergies    . furosemide (LASIX) 40 MG tablet Take 40 mg by mouth in the morning and 20 mg (1/2 tablet) by mouth in evening. 135 tablet 0  . gabapentin (NEURONTIN) 300 MG capsule Take 300 mg by mouth. 3-4x/daily    . lactulose (CHRONULAC) 10 GM/15ML solution 1-2tablespoons daily 240 mL 5  . levothyroxine (SYNTHROID, LEVOTHROID) 75 MCG tablet Take 75 mcg by mouth daily.    . Multiple Minerals-Vitamins (CALCIUM & VIT D3 BONE HEALTH PO) Take by mouth 2 (two) times daily. 1000/500mg     . Nebivolol HCl 20 MG TABS Take 20 mg by mouth daily.     . Omega-3 Fatty Acids (FISH OIL) 600 MG CAPS Take 600 mg by mouth daily.     Marland Kitchen omeprazole (PRILOSEC) 20 MG capsule Take 20 mg by  mouth daily.     . ranitidine (ZANTAC) 150 MG tablet Take 150 mg by mouth at bedtime.     . simvastatin (ZOCOR) 20 MG tablet Take 20 mg by mouth at bedtime.     . Tamsulosin HCl (FLOMAX) 0.4 MG CAPS Take 0.4 mg by mouth daily.    Marland Kitchen warfarin (COUMADIN) 5 MG tablet Take 1 tablet by mouth daily or as directed by coumadin clinic. 90 tablet 2   No current facility-administered medications for this visit.    Allergies as of 06/02/2015 - Review Complete 06/02/2015  Allergen Reaction Noted  . Oxycodone  05/12/2013  . Chlorhexidine gluconate  11/26/2011  . Zoloft [sertraline] Hives, Swelling, and Rash 05/15/2013    Vitals: BP 130/90 mmHg  Pulse 88  Resp 20  Ht   Wt  Last Weight:  Wt Readings from Last 1 Encounters:  12/24/14 262 lb (118.842 kg)   Last Height:   Ht Readings from Last 1 Encounters:  02/01/15 5\' 2"  (1.575 m)    Physical exam:   Height ;  5.4 ", 260 pounds  General: The patient is awake, alert and appears not in acute distress. The patient is well groomed.  Head: Normocephalic, atraumatic. Neck is supple. Mallampati 4 neck circumference:19 i nches .  Cardiovascular: Regular rate and rhythm today , without murmurs or carotid bruit, and without distended neck veins.  Respiratory: Lungs are clear to auscultation.  Skin: evidence of edema, not Rash, Injection site reaction.  Trunk: BMI is elevated. Neurologic exam :  The patient is awake and alert,  oriented to place and time.  Memory subjective described as intact. There is a normal attention span & concentration ability.  Speech without dysarthria, dysphonia , delayed wordfinding- aphasia.  Mood and affect are appropriate.   Cranial nerves:  Pupils are equal and briskly reactive to light. Extraocular movements in vertical and horizontal planes intact and without nystagmus.  Visual fields by finger perimetry are intact.  Hearing to finger rub intact. Facial sensation intact to fine touch.  Facial motor strength is  symmetric and tongue and uvula move midline.  Motor exam: Patient reports that she is losing dexterity in her right hand feels clumsy and also numbish. She reports a gloved feeling.  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  She reports trouble with spitting up fine objects like picking lint or picking up a pill. Numb fingers. Lost proprioception.   Decreased strength in all extremities.  Sensory: Fine touch, pinprick and vibration were restricted in the right, dominant hand, both feet Coordination:  He reports changing in her handwriting and trouble holding a pencil in her right him a dominant hand.  Rapid alternating movements in the fingers/hands is slow.  Gait and station wheelchair.   Deep tendon reflexes: in the upper and lower extremities are attenuated, right Babinski positive- up going.   ASSESSMENT AND PLAN  69 y.o. year old female Patient with secondary progressive  multiple sclerosis; Hypertension; PAF (paroxysmal atrial fibrillation); Thyroid disease; obesity hypoventilation,   Pneumonia; Sleep apnea; Hyperthyroidism; Edema;    Here with for routine followup of Secondary Progressive MS.  Spasticity on baclofen.       PLAN :  Continue ACTHAR 2 days a week - she has atrial fibrillation again, concerned that she needs an ablation. Is Acthar contributing to a -fib, as did her steroid pulses?     The PT has noted improvement while on ACTHAR. She reports better sleep, better bowl control and bladder control. 5 in a row ,  With relapse. Can be used each month, [facility]   Continues with nasal pillows, chin strap for oral air leakage.  DME [facility]; please allow her to change to a dreamwear interface and fit it.  Continue Baclofen PUMP with Dr [name].   Keep next follow up appointment alternating with me or NP in 4 months.  Please perform MMSE/ MOCA and leave this order under LOS.  No MRI - she is not interested ,  She will have a blood draw October , CBC  and CMET .She sees Dr. [name] at that time, too;  Return to nasal pillow on CPAP.    Asencion Partridge Luccia Reinheimer MD  06/02/2015

## 2015-06-02 NOTE — Addendum Note (Signed)
Addended by: [place] A on: 06/02/2015 04:44 PM   Modules accepted: Orders

## 2015-06-02 NOTE — Progress Notes (Signed)
Dr. Brett Fairy gave verbal order to continue pt's PT home health. I advised Barnabas Lister from [facility], Barnabas Lister verbalized understanding.

## 2015-06-02 NOTE — Addendum Note (Signed)
Addended by: [name] on: 06/02/2015 04:32 PM   Modules accepted: Orders

## 2015-06-03 DIAGNOSIS — N319 Neuromuscular dysfunction of bladder, unspecified: Secondary | ICD-10-CM | POA: Diagnosis not present

## 2015-06-03 DIAGNOSIS — I10 Essential (primary) hypertension: Secondary | ICD-10-CM | POA: Diagnosis not present

## 2015-06-03 DIAGNOSIS — E039 Hypothyroidism, unspecified: Secondary | ICD-10-CM | POA: Diagnosis not present

## 2015-06-03 DIAGNOSIS — G35 Multiple sclerosis: Secondary | ICD-10-CM | POA: Diagnosis not present

## 2015-06-03 DIAGNOSIS — K592 Neurogenic bowel, not elsewhere classified: Secondary | ICD-10-CM | POA: Diagnosis not present

## 2015-06-03 DIAGNOSIS — I4891 Unspecified atrial fibrillation: Secondary | ICD-10-CM | POA: Diagnosis not present

## 2015-06-07 ENCOUNTER — Telehealth: Payer: Self-pay | Admitting: Neurology

## 2015-06-07 DIAGNOSIS — G35 Multiple sclerosis: Secondary | ICD-10-CM

## 2015-06-07 DIAGNOSIS — I509 Heart failure, unspecified: Secondary | ICD-10-CM

## 2015-06-07 DIAGNOSIS — G4737 Central sleep apnea in conditions classified elsewhere: Secondary | ICD-10-CM

## 2015-06-07 MED ORDER — CORTICOTROPIN 80 UNIT/ML IJ GEL: 80.0000 [IU] | INTRAMUSCULAR | Status: DC

## 2015-06-07 NOTE — Telephone Encounter (Signed)
Prescription has now been resent to [facility] for 5 injections per patient request.  Receipt confirmed by pharmacy.  I called the patient back to advise.  Got no answer.  Left message.

## 2015-06-07 NOTE — Telephone Encounter (Signed)
Patient called to advise she found prescription for corticotropin (ATHACAR H.P.) 80 UNIT/ML injectable gel and advises that this Rx needs to be faxed to [facility]. It says quantity of 10 and she only does 5 shots per month. Patient doesn't know if they will question this at the pharmacy and wonders if it should be written for 5. Please call patient [phone].

## 2015-06-08 DIAGNOSIS — K592 Neurogenic bowel, not elsewhere classified: Secondary | ICD-10-CM | POA: Diagnosis not present

## 2015-06-08 DIAGNOSIS — N319 Neuromuscular dysfunction of bladder, unspecified: Secondary | ICD-10-CM | POA: Diagnosis not present

## 2015-06-08 DIAGNOSIS — I4891 Unspecified atrial fibrillation: Secondary | ICD-10-CM | POA: Diagnosis not present

## 2015-06-08 DIAGNOSIS — G35 Multiple sclerosis: Secondary | ICD-10-CM | POA: Diagnosis not present

## 2015-06-08 DIAGNOSIS — I10 Essential (primary) hypertension: Secondary | ICD-10-CM | POA: Diagnosis not present

## 2015-06-08 DIAGNOSIS — E039 Hypothyroidism, unspecified: Secondary | ICD-10-CM | POA: Diagnosis not present

## 2015-06-09 DIAGNOSIS — G35 Multiple sclerosis: Secondary | ICD-10-CM | POA: Diagnosis not present

## 2015-06-09 DIAGNOSIS — I4891 Unspecified atrial fibrillation: Secondary | ICD-10-CM | POA: Diagnosis not present

## 2015-06-09 DIAGNOSIS — Z7901 Long term (current) use of anticoagulants: Secondary | ICD-10-CM | POA: Diagnosis not present

## 2015-06-09 DIAGNOSIS — E039 Hypothyroidism, unspecified: Secondary | ICD-10-CM | POA: Diagnosis not present

## 2015-06-09 DIAGNOSIS — N319 Neuromuscular dysfunction of bladder, unspecified: Secondary | ICD-10-CM | POA: Diagnosis not present

## 2015-06-09 DIAGNOSIS — K592 Neurogenic bowel, not elsewhere classified: Secondary | ICD-10-CM | POA: Diagnosis not present

## 2015-06-09 DIAGNOSIS — I10 Essential (primary) hypertension: Secondary | ICD-10-CM | POA: Diagnosis not present

## 2015-06-09 DIAGNOSIS — E785 Hyperlipidemia, unspecified: Secondary | ICD-10-CM | POA: Diagnosis not present

## 2015-06-14 ENCOUNTER — Telehealth: Payer: Self-pay | Admitting: Neurology

## 2015-06-14 DIAGNOSIS — N319 Neuromuscular dysfunction of bladder, unspecified: Secondary | ICD-10-CM | POA: Diagnosis not present

## 2015-06-14 DIAGNOSIS — G35 Multiple sclerosis: Secondary | ICD-10-CM | POA: Diagnosis not present

## 2015-06-14 DIAGNOSIS — I10 Essential (primary) hypertension: Secondary | ICD-10-CM | POA: Diagnosis not present

## 2015-06-14 DIAGNOSIS — K592 Neurogenic bowel, not elsewhere classified: Secondary | ICD-10-CM | POA: Diagnosis not present

## 2015-06-14 DIAGNOSIS — I4891 Unspecified atrial fibrillation: Secondary | ICD-10-CM | POA: Diagnosis not present

## 2015-06-14 DIAGNOSIS — E039 Hypothyroidism, unspecified: Secondary | ICD-10-CM | POA: Diagnosis not present

## 2015-06-14 DIAGNOSIS — I509 Heart failure, unspecified: Secondary | ICD-10-CM

## 2015-06-14 DIAGNOSIS — G4737 Central sleep apnea in conditions classified elsewhere: Secondary | ICD-10-CM

## 2015-06-14 MED ORDER — CORTICOTROPIN 80 UNIT/ML IJ GEL: 80.0000 [IU] | INTRAMUSCULAR | Status: DC

## 2015-06-14 NOTE — Telephone Encounter (Signed)
Pt needs refill on corticotropin (ATHACAR H.P.) 80 UNIT/ML injectable gel [facility].

## 2015-06-14 NOTE — Telephone Encounter (Signed)
This Rx was previously sent to [facility] on 10/17, however, we have sent it again.  Receipt confirmed by pharmacy.

## 2015-06-15 ENCOUNTER — Other Ambulatory Visit: Payer: Self-pay

## 2015-06-15 DIAGNOSIS — G35 Multiple sclerosis: Secondary | ICD-10-CM

## 2015-06-15 DIAGNOSIS — I509 Heart failure, unspecified: Secondary | ICD-10-CM

## 2015-06-15 DIAGNOSIS — G4737 Central sleep apnea in conditions classified elsewhere: Secondary | ICD-10-CM

## 2015-06-15 DIAGNOSIS — R351 Nocturia: Secondary | ICD-10-CM | POA: Diagnosis not present

## 2015-06-15 MED ORDER — CORTICOTROPIN 80 UNIT/ML IJ GEL: 80.0000 [IU] | INTRAMUSCULAR | Status: DC

## 2015-06-15 MED ORDER — SYRINGE/NEEDLE (DISP) 25G X 5/8" 1 ML MISC
Status: DC
Start: 2015-06-15 — End: 2022-11-20

## 2015-06-17 DIAGNOSIS — M25532 Pain in left wrist: Secondary | ICD-10-CM | POA: Diagnosis not present

## 2015-06-17 NOTE — Telephone Encounter (Signed)
Error

## 2015-06-21 ENCOUNTER — Encounter: Payer: Self-pay | Admitting: Neurology

## 2015-06-21 ENCOUNTER — Ambulatory Visit (INDEPENDENT_AMBULATORY_CARE_PROVIDER_SITE_OTHER): Payer: Medicare Other | Admitting: Neurology

## 2015-06-21 VITALS — BP 108/70 | HR 60

## 2015-06-21 DIAGNOSIS — G35 Multiple sclerosis: Secondary | ICD-10-CM

## 2015-06-21 DIAGNOSIS — M6249 Contracture of muscle, multiple sites: Secondary | ICD-10-CM

## 2015-06-21 DIAGNOSIS — M62838 Other muscle spasm: Secondary | ICD-10-CM

## 2015-06-21 MED ORDER — BACLOFEN 40 MG/20ML IT SOLN
80.0000 mg | Freq: Once | INTRATHECAL | Status: AC
  Administered 2015-06-21: 80 mg via INTRATHECAL

## 2015-06-21 NOTE — Procedures (Signed)
     History:  Toni [name] is a 69 year old patient with a history of multiple sclerosis with an associated gait disorder, spasticity in the lower extremities. The patient has a baclofen pump in place, she indicates that she has done fairly well since last seen, she reports no falls. She denies that her spasticity level has changed to a degree she returns for a baclofen pump refill.  Baclofen pump refill note  The baclofen pump site was cleaned with Betadine solution. A 21-gauge needle was inserted into the pump port site. Approximately 5 cc of residual baclofen was removed, the pump indicates that it has 2.8 mL. 40 cc of replacement baclofen was placed into the pump at 2000 mcg/cc concentration.  The pump was reprogrammed for the following settings: The pump was kept at a basal rate of 28.0 g per hour. From 1 AM to 5 AM, the rate drops to 24.8 mcg/h. The patient gets 665.9 g per day.  The alarm volume is set at 1.5 mL. The next alarm date is 10/14/2015.  The patient tolerated the procedure well. There were no complications of the above procedure.  [facility] number is [phone] The baclofen expiration date is July 2018. The baclofen lot number is [id number].

## 2015-06-21 NOTE — Progress Notes (Signed)
Please refer to baclofen pump refill note.

## 2015-06-23 DIAGNOSIS — E039 Hypothyroidism, unspecified: Secondary | ICD-10-CM | POA: Diagnosis not present

## 2015-06-23 DIAGNOSIS — G35 Multiple sclerosis: Secondary | ICD-10-CM | POA: Diagnosis not present

## 2015-06-23 DIAGNOSIS — N319 Neuromuscular dysfunction of bladder, unspecified: Secondary | ICD-10-CM | POA: Diagnosis not present

## 2015-06-23 DIAGNOSIS — K592 Neurogenic bowel, not elsewhere classified: Secondary | ICD-10-CM | POA: Diagnosis not present

## 2015-06-23 DIAGNOSIS — I10 Essential (primary) hypertension: Secondary | ICD-10-CM | POA: Diagnosis not present

## 2015-06-23 DIAGNOSIS — I4891 Unspecified atrial fibrillation: Secondary | ICD-10-CM | POA: Diagnosis not present

## 2015-06-29 ENCOUNTER — Encounter: Payer: Self-pay | Admitting: Internal Medicine

## 2015-07-01 DIAGNOSIS — G35 Multiple sclerosis: Secondary | ICD-10-CM | POA: Diagnosis not present

## 2015-07-01 DIAGNOSIS — K592 Neurogenic bowel, not elsewhere classified: Secondary | ICD-10-CM | POA: Diagnosis not present

## 2015-07-01 DIAGNOSIS — N319 Neuromuscular dysfunction of bladder, unspecified: Secondary | ICD-10-CM | POA: Diagnosis not present

## 2015-07-01 DIAGNOSIS — I10 Essential (primary) hypertension: Secondary | ICD-10-CM | POA: Diagnosis not present

## 2015-07-01 DIAGNOSIS — E039 Hypothyroidism, unspecified: Secondary | ICD-10-CM | POA: Diagnosis not present

## 2015-07-01 DIAGNOSIS — I4891 Unspecified atrial fibrillation: Secondary | ICD-10-CM | POA: Diagnosis not present

## 2015-07-05 ENCOUNTER — Telehealth: Payer: Self-pay | Admitting: Neurology

## 2015-07-05 NOTE — Telephone Encounter (Signed)
Sometimes ins will not allow [name] to request coverage for the next calendar year until Dec, however, we have contacted ins and provided clinical info.  Request is currently under review Ref # [id number]  Ins responded stating it is too soon to request the prior auth at this time.  They will review the request in late Dec and notify both [name] and the patient of a response.

## 2015-07-05 NOTE — Telephone Encounter (Signed)
Pt called sts she needs prior auth for corticotropin (ATHACAR H.P.) 80 UNIT/ML injectable gel for 2017.

## 2015-07-08 DIAGNOSIS — I4891 Unspecified atrial fibrillation: Secondary | ICD-10-CM | POA: Diagnosis not present

## 2015-07-08 DIAGNOSIS — E039 Hypothyroidism, unspecified: Secondary | ICD-10-CM | POA: Diagnosis not present

## 2015-07-08 DIAGNOSIS — N319 Neuromuscular dysfunction of bladder, unspecified: Secondary | ICD-10-CM | POA: Diagnosis not present

## 2015-07-08 DIAGNOSIS — K592 Neurogenic bowel, not elsewhere classified: Secondary | ICD-10-CM | POA: Diagnosis not present

## 2015-07-08 DIAGNOSIS — G35 Multiple sclerosis: Secondary | ICD-10-CM | POA: Diagnosis not present

## 2015-07-08 DIAGNOSIS — I10 Essential (primary) hypertension: Secondary | ICD-10-CM | POA: Diagnosis not present

## 2015-07-12 ENCOUNTER — Ambulatory Visit (INDEPENDENT_AMBULATORY_CARE_PROVIDER_SITE_OTHER): Payer: Medicare Other | Admitting: Pharmacist Clinician (PhC)/ Clinical Pharmacy Specialist

## 2015-07-12 DIAGNOSIS — I481 Persistent atrial fibrillation: Secondary | ICD-10-CM

## 2015-07-12 DIAGNOSIS — Z7901 Long term (current) use of anticoagulants: Secondary | ICD-10-CM

## 2015-07-12 DIAGNOSIS — I4819 Other persistent atrial fibrillation: Secondary | ICD-10-CM

## 2015-07-12 LAB — POCT INR: INR: 2.4

## 2015-07-13 DIAGNOSIS — I10 Essential (primary) hypertension: Secondary | ICD-10-CM | POA: Diagnosis not present

## 2015-07-13 DIAGNOSIS — K592 Neurogenic bowel, not elsewhere classified: Secondary | ICD-10-CM | POA: Diagnosis not present

## 2015-07-13 DIAGNOSIS — G35 Multiple sclerosis: Secondary | ICD-10-CM | POA: Diagnosis not present

## 2015-07-13 DIAGNOSIS — N319 Neuromuscular dysfunction of bladder, unspecified: Secondary | ICD-10-CM | POA: Diagnosis not present

## 2015-07-13 DIAGNOSIS — E039 Hypothyroidism, unspecified: Secondary | ICD-10-CM | POA: Diagnosis not present

## 2015-07-13 DIAGNOSIS — I4891 Unspecified atrial fibrillation: Secondary | ICD-10-CM | POA: Diagnosis not present

## 2015-07-23 ENCOUNTER — Telehealth: Payer: Self-pay | Admitting: Neurology

## 2015-07-23 DIAGNOSIS — G35 Multiple sclerosis: Secondary | ICD-10-CM

## 2015-07-23 NOTE — Telephone Encounter (Signed)
[facility] [phone] called to advise patient cancelled yesterday, next week is the last week for PT maintenance program. Toni [name] is requesting additional PT maintenance program for 1 x per week x 9 weeks. Toni [name] is aware Dr. Brett Fairy and [name] are out of the office until Monday. This can wait until they are back in the office.

## 2015-07-23 NOTE — Telephone Encounter (Signed)
I would like to offer this patient more physical therapy maintenance. I was not aware that she had counseled her appointment I hope she is well and will call her later today. Toni [name]  I just called the patient's home her husband answered. She just did not feel well and was a little more tired after Thanksgiving. She rescheduled her appointment was physical therapy she does neither have a fever , her husband noted she  didn't have any falls or accidents. CD

## 2015-07-26 NOTE — Telephone Encounter (Signed)
Spoke to [place], PT at [facility]. I advised him that Dr. Brett Fairy does want to offer this pt more PT maintenance. Barnabas Lister verbalized understanding.

## 2015-07-29 DIAGNOSIS — I10 Essential (primary) hypertension: Secondary | ICD-10-CM | POA: Diagnosis not present

## 2015-07-29 DIAGNOSIS — I4891 Unspecified atrial fibrillation: Secondary | ICD-10-CM | POA: Diagnosis not present

## 2015-07-29 DIAGNOSIS — E039 Hypothyroidism, unspecified: Secondary | ICD-10-CM | POA: Diagnosis not present

## 2015-07-29 DIAGNOSIS — N319 Neuromuscular dysfunction of bladder, unspecified: Secondary | ICD-10-CM | POA: Diagnosis not present

## 2015-07-29 DIAGNOSIS — G35 Multiple sclerosis: Secondary | ICD-10-CM | POA: Diagnosis not present

## 2015-07-29 DIAGNOSIS — K592 Neurogenic bowel, not elsewhere classified: Secondary | ICD-10-CM | POA: Diagnosis not present

## 2015-07-30 DIAGNOSIS — E038 Other specified hypothyroidism: Secondary | ICD-10-CM | POA: Diagnosis not present

## 2015-07-30 DIAGNOSIS — Z23 Encounter for immunization: Secondary | ICD-10-CM | POA: Diagnosis not present

## 2015-07-30 DIAGNOSIS — R609 Edema, unspecified: Secondary | ICD-10-CM | POA: Diagnosis not present

## 2015-07-30 DIAGNOSIS — Z7901 Long term (current) use of anticoagulants: Secondary | ICD-10-CM | POA: Diagnosis not present

## 2015-07-30 DIAGNOSIS — I480 Paroxysmal atrial fibrillation: Secondary | ICD-10-CM | POA: Diagnosis not present

## 2015-07-30 DIAGNOSIS — K219 Gastro-esophageal reflux disease without esophagitis: Secondary | ICD-10-CM | POA: Diagnosis not present

## 2015-07-30 DIAGNOSIS — R7301 Impaired fasting glucose: Secondary | ICD-10-CM | POA: Diagnosis not present

## 2015-07-30 DIAGNOSIS — F418 Other specified anxiety disorders: Secondary | ICD-10-CM | POA: Diagnosis not present

## 2015-07-30 DIAGNOSIS — I10 Essential (primary) hypertension: Secondary | ICD-10-CM | POA: Diagnosis not present

## 2015-07-30 DIAGNOSIS — G4733 Obstructive sleep apnea (adult) (pediatric): Secondary | ICD-10-CM | POA: Diagnosis not present

## 2015-07-30 DIAGNOSIS — G35 Multiple sclerosis: Secondary | ICD-10-CM | POA: Diagnosis not present

## 2015-08-03 ENCOUNTER — Ambulatory Visit: Payer: Medicare Other | Admitting: Internal Medicine

## 2015-08-05 DIAGNOSIS — N319 Neuromuscular dysfunction of bladder, unspecified: Secondary | ICD-10-CM | POA: Diagnosis not present

## 2015-08-05 DIAGNOSIS — I10 Essential (primary) hypertension: Secondary | ICD-10-CM | POA: Diagnosis not present

## 2015-08-05 DIAGNOSIS — I4891 Unspecified atrial fibrillation: Secondary | ICD-10-CM | POA: Diagnosis not present

## 2015-08-05 DIAGNOSIS — E039 Hypothyroidism, unspecified: Secondary | ICD-10-CM | POA: Diagnosis not present

## 2015-08-05 DIAGNOSIS — G35 Multiple sclerosis: Secondary | ICD-10-CM | POA: Diagnosis not present

## 2015-08-05 DIAGNOSIS — K592 Neurogenic bowel, not elsewhere classified: Secondary | ICD-10-CM | POA: Diagnosis not present

## 2015-08-08 DIAGNOSIS — Z7901 Long term (current) use of anticoagulants: Secondary | ICD-10-CM | POA: Diagnosis not present

## 2015-08-08 DIAGNOSIS — E039 Hypothyroidism, unspecified: Secondary | ICD-10-CM | POA: Diagnosis not present

## 2015-08-08 DIAGNOSIS — I4891 Unspecified atrial fibrillation: Secondary | ICD-10-CM | POA: Diagnosis not present

## 2015-08-08 DIAGNOSIS — G35 Multiple sclerosis: Secondary | ICD-10-CM | POA: Diagnosis not present

## 2015-08-08 DIAGNOSIS — E785 Hyperlipidemia, unspecified: Secondary | ICD-10-CM | POA: Diagnosis not present

## 2015-08-08 DIAGNOSIS — N319 Neuromuscular dysfunction of bladder, unspecified: Secondary | ICD-10-CM | POA: Diagnosis not present

## 2015-08-08 DIAGNOSIS — K592 Neurogenic bowel, not elsewhere classified: Secondary | ICD-10-CM | POA: Diagnosis not present

## 2015-08-08 DIAGNOSIS — I10 Essential (primary) hypertension: Secondary | ICD-10-CM | POA: Diagnosis not present

## 2015-08-09 ENCOUNTER — Ambulatory Visit (INDEPENDENT_AMBULATORY_CARE_PROVIDER_SITE_OTHER): Payer: Medicare Other | Admitting: Pharmacist Clinician (PhC)/ Clinical Pharmacy Specialist

## 2015-08-09 DIAGNOSIS — Z7901 Long term (current) use of anticoagulants: Secondary | ICD-10-CM | POA: Diagnosis not present

## 2015-08-09 DIAGNOSIS — R0789 Other chest pain: Secondary | ICD-10-CM | POA: Diagnosis not present

## 2015-08-09 DIAGNOSIS — I481 Persistent atrial fibrillation: Secondary | ICD-10-CM

## 2015-08-09 DIAGNOSIS — I4819 Other persistent atrial fibrillation: Secondary | ICD-10-CM

## 2015-08-09 LAB — POCT INR: INR: 1.8

## 2015-08-09 NOTE — Progress Notes (Signed)
Ms. Olliver reported left upper chest and left arm pain today to [place].  I recommended we get an EKG to evaluate her symptoms. I reviewed that EKG which shows atrial fibrillation at a rate of 98. There are no ischemic EKG changes.  I advised her to try her typical medications for chest wall pain.  If her symptoms do not improve she's advised to go to the emergency department.  Of note she had normal coronary arteries by cardiac catheterization 2011.  Pixie Casino, MD, [facility] Attending Cardiologist [place]

## 2015-08-12 DIAGNOSIS — E039 Hypothyroidism, unspecified: Secondary | ICD-10-CM | POA: Diagnosis not present

## 2015-08-12 DIAGNOSIS — I4891 Unspecified atrial fibrillation: Secondary | ICD-10-CM | POA: Diagnosis not present

## 2015-08-12 DIAGNOSIS — K592 Neurogenic bowel, not elsewhere classified: Secondary | ICD-10-CM | POA: Diagnosis not present

## 2015-08-12 DIAGNOSIS — N319 Neuromuscular dysfunction of bladder, unspecified: Secondary | ICD-10-CM | POA: Diagnosis not present

## 2015-08-12 DIAGNOSIS — I10 Essential (primary) hypertension: Secondary | ICD-10-CM | POA: Diagnosis not present

## 2015-08-12 DIAGNOSIS — G35 Multiple sclerosis: Secondary | ICD-10-CM | POA: Diagnosis not present

## 2015-08-17 ENCOUNTER — Telehealth: Payer: Self-pay

## 2015-08-17 DIAGNOSIS — I4891 Unspecified atrial fibrillation: Secondary | ICD-10-CM | POA: Diagnosis not present

## 2015-08-17 DIAGNOSIS — N319 Neuromuscular dysfunction of bladder, unspecified: Secondary | ICD-10-CM | POA: Diagnosis not present

## 2015-08-17 DIAGNOSIS — K592 Neurogenic bowel, not elsewhere classified: Secondary | ICD-10-CM | POA: Diagnosis not present

## 2015-08-17 DIAGNOSIS — G35 Multiple sclerosis: Secondary | ICD-10-CM | POA: Diagnosis not present

## 2015-08-17 DIAGNOSIS — I10 Essential (primary) hypertension: Secondary | ICD-10-CM | POA: Diagnosis not present

## 2015-08-17 DIAGNOSIS — E039 Hypothyroidism, unspecified: Secondary | ICD-10-CM | POA: Diagnosis not present

## 2015-08-17 NOTE — Telephone Encounter (Signed)
[facility]) has approved the request for coverage on Acthar effective until 08/20/2016, or until the policy changes or is terminated Ref # [id number]

## 2015-08-26 DIAGNOSIS — N319 Neuromuscular dysfunction of bladder, unspecified: Secondary | ICD-10-CM | POA: Diagnosis not present

## 2015-08-26 DIAGNOSIS — G35 Multiple sclerosis: Secondary | ICD-10-CM | POA: Diagnosis not present

## 2015-08-26 DIAGNOSIS — I4891 Unspecified atrial fibrillation: Secondary | ICD-10-CM | POA: Diagnosis not present

## 2015-08-26 DIAGNOSIS — K592 Neurogenic bowel, not elsewhere classified: Secondary | ICD-10-CM | POA: Diagnosis not present

## 2015-08-26 DIAGNOSIS — E039 Hypothyroidism, unspecified: Secondary | ICD-10-CM | POA: Diagnosis not present

## 2015-08-26 DIAGNOSIS — I10 Essential (primary) hypertension: Secondary | ICD-10-CM | POA: Diagnosis not present

## 2015-08-30 DIAGNOSIS — I4891 Unspecified atrial fibrillation: Secondary | ICD-10-CM | POA: Diagnosis not present

## 2015-08-30 DIAGNOSIS — G35 Multiple sclerosis: Secondary | ICD-10-CM | POA: Diagnosis not present

## 2015-08-30 DIAGNOSIS — I10 Essential (primary) hypertension: Secondary | ICD-10-CM | POA: Diagnosis not present

## 2015-08-30 DIAGNOSIS — E039 Hypothyroidism, unspecified: Secondary | ICD-10-CM | POA: Diagnosis not present

## 2015-08-30 DIAGNOSIS — N319 Neuromuscular dysfunction of bladder, unspecified: Secondary | ICD-10-CM | POA: Diagnosis not present

## 2015-08-30 DIAGNOSIS — K592 Neurogenic bowel, not elsewhere classified: Secondary | ICD-10-CM | POA: Diagnosis not present

## 2015-08-31 ENCOUNTER — Encounter: Payer: Self-pay | Admitting: Internal Medicine

## 2015-08-31 ENCOUNTER — Ambulatory Visit (HOSPITAL_COMMUNITY)
Admission: RE | Admit: 2015-08-31 | Discharge: 2015-08-31 | Disposition: A | Discharge status: Home / Self Care | Payer: Medicare Other | Source: Ambulatory Visit | Attending: Cardiology | Admitting: Cardiology

## 2015-08-31 ENCOUNTER — Other Ambulatory Visit: Payer: Self-pay | Admitting: Internal Medicine

## 2015-08-31 ENCOUNTER — Ambulatory Visit (INDEPENDENT_AMBULATORY_CARE_PROVIDER_SITE_OTHER): Payer: Medicare Other | Admitting: Internal Medicine

## 2015-08-31 ENCOUNTER — Ambulatory Visit (INDEPENDENT_AMBULATORY_CARE_PROVIDER_SITE_OTHER): Payer: Medicare Other | Admitting: Pharmacist Clinician (PhC)/ Clinical Pharmacy Specialist

## 2015-08-31 VITALS — BP 120/82 | HR 108 | Ht 62.0 in

## 2015-08-31 DIAGNOSIS — R0989 Other specified symptoms and signs involving the circulatory and respiratory systems: Secondary | ICD-10-CM | POA: Diagnosis not present

## 2015-08-31 DIAGNOSIS — E785 Hyperlipidemia, unspecified: Secondary | ICD-10-CM

## 2015-08-31 DIAGNOSIS — R600 Localized edema: Secondary | ICD-10-CM

## 2015-08-31 DIAGNOSIS — I10 Essential (primary) hypertension: Secondary | ICD-10-CM

## 2015-08-31 DIAGNOSIS — I83893 Varicose veins of bilateral lower extremities with other complications: Secondary | ICD-10-CM | POA: Diagnosis not present

## 2015-08-31 DIAGNOSIS — Z0000 Encounter for general adult medical examination without abnormal findings: Secondary | ICD-10-CM

## 2015-08-31 DIAGNOSIS — I481 Persistent atrial fibrillation: Secondary | ICD-10-CM | POA: Diagnosis not present

## 2015-08-31 DIAGNOSIS — I872 Venous insufficiency (chronic) (peripheral): Secondary | ICD-10-CM

## 2015-08-31 DIAGNOSIS — G35 Multiple sclerosis: Secondary | ICD-10-CM

## 2015-08-31 DIAGNOSIS — I4819 Other persistent atrial fibrillation: Secondary | ICD-10-CM

## 2015-08-31 DIAGNOSIS — Z7901 Long term (current) use of anticoagulants: Secondary | ICD-10-CM | POA: Diagnosis not present

## 2015-08-31 DIAGNOSIS — G4733 Obstructive sleep apnea (adult) (pediatric): Secondary | ICD-10-CM

## 2015-08-31 DIAGNOSIS — Z9989 Dependence on other enabling machines and devices: Secondary | ICD-10-CM

## 2015-08-31 LAB — POCT INR: INR: 2.2

## 2015-08-31 MED ORDER — FUROSEMIDE 40 MG PO TABS: 60.0000 mg | ORAL_TABLET | Freq: Every day | ORAL | Status: DC

## 2015-08-31 NOTE — Patient Instructions (Addendum)
Medication Instructions:  Your physician has recommended you make the following change in your medication:  1) INCREASE Lasix to 60 mg (one and half tablets) by mouth ONCE daily   Labwork: none  Testing/Procedures: Your physician has requested that you have a lower extremity arterial exercise duplex. During this test, exercise and ultrasound are used to evaluate arterial blood flow in the legs. Allow one hour for this exam. There are no restrictions or special instructions.  Your physician has requested that you have a lower extremity venous duplex. This test is an ultrasound of the veins in the legs or arms. It looks at venous blood flow that carries blood from the heart to the legs or arms. Allow one hour for a Lower Venous exam. Allow thirty minutes for an Upper Venous exam. There are no restrictions or special instructions.   Follow-Up: Your physician recommends that you schedule a follow-up appointment in: 4-6 weeks with Dr. Debara Pickett.     Any Other Special Instructions Will Be Listed Below (If Applicable).     If you need a refill on your cardiac medications before your next appointment, please call your pharmacy.

## 2015-08-31 NOTE — Progress Notes (Signed)
OFFICE NOTE  Chief Complaint:  Follow-up chest pain and A. fib  Primary Care Physician: [name], MD  HPI:  Toni [name] is a 70 year old female with multiple sclerosis, paroxysmal atrial fibrillation, on Coumadin, morbid obesity, obstructive sleep apnea, and dyslipidemia. She has had worsening swelling in her legs, thought to be due to an MS flare. She has a history of varicose veins with severe venous insufficiency in both the right and left greater saphenous veins. The right greater saphenous vein was actually successfully ablated by my partner, Dr. Elisabeth Cara, about 1 to 2 years ago. She does have residual reflux in the left greater saphenous vein extending from the proximal calf up to the saphenofemoral junction. The vein measured between 4 and 8 mm with between 1 and 6 seconds of reflux. We had discussed ablation of that before, but she has canceled several times at procedures. At this point, I think there is little benefit to continuing to try for her to undergo ablation. 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 She can wear work showed a compression stockings and this does seem to help her. With regards to her atrial fibrillation she has not had significant recurrence to her knowledge. She continues on warfarin and has been therapeutic, with her INR of 1.9 in the office by fingerstick today.  Toni [name] returns today for followup. She reports her MS is somewhat progressive. She continues to have some enlargement of her legs but no significant swelling. There is a mild varicosities in the right lower extremity. She is unaware of her atrial fibrillation. INR today was 2.3.   I saw Toni [name] back in the office today,  She is describing more shortness of breath and feels that she's been in A. Fib more recently. In fact today her EKG does show persistent atrial fibrillation. In the past she's been paroxysmal , but it may be that she is more  frequently having A. Fib. She has trouble telling whether not she is more short of breath or symptomatic from A. Fib versus her multiple sclerosis. She is also reporting more swelling in her left leg.  Toni [name] returns for follow-up today. She recently saw her neurologist regarding her MS which seems to be flaring. In addition she was noted to have significant apneic events despite using CPAP. There is concern for possible central sleep apnea. Both of these episodes are probably contributing to her recurrent atrial fibrillation. She's been wearing a monitor for a period of time which indicates a burden of A. fib of about 80% of the time. It does not appear that she is in persistent A. fib, rather she has had some episodes of sinus rhythm, but they're much less frequent than her A. Fib.  I saw Toni [name] back today in the office. Recently I saw her as an add-on visit briefly as she was describing some chest pain when she was getting her INR checked. An EKG at that time showed no ischemia. I did not feel that this was cardiac. She continues intermittently have abnormal pains both in her chest as well as her abdomen and extremities. This may be related to MS. She's had difficulty with CPAP and apparently getting a good mask fit. She has been a persistent atrial fibrillation for about 6 months. It's unclear whether or not she symptomatic with this and therefore I have not pursued a rhythm control strategy. Her rate control is generally pretty good and  she has been therapeutic on warfarin. She is reporting worsening swelling of her legs although her weight she says is been stable. She has a history of venous insufficiency and had a right greater saphenous vein ablation by one of my partners. She's concerned about more swelling in her left leg and whether she's developed worsening venous insufficiency. She also reports cold feet with numbness in her extremities. As she is basically wheelchair bound, I cannot assess  whether she is having any claudication.  PMHx:  Past Medical History  Diagnosis Date  . Multiple sclerosis ([place])     has had this may 1989-[facility] reg doc  . Hypertension   . PAF (paroxysmal atrial fibrillation) ([place])     on coumadin; documented on monitor 06/2010  . Thyroid disease   . HLD (hyperlipidemia)   . Shortness of breath   . Pneumonia   . Hyperthyroidism   . Edema     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' ablation of R GSV 2012  . Depression   . Anxiety   . Headache(784.0)     MIGRAINE   . Chronic pain   . Hx of echocardiogram 04/22/10    EF 55-60%, no valve issues  . History of stress test 06/21/10    limited exam with some degree of breast attenuatin of the apex however a component of apical ischemia is not excluded, EF 62%; cardiac cath   . Sleep apnea     cpap  . Abnormality of gait 03/01/2015    Past Surgical History  Procedure Laterality Date  . Cervical fusion      with correction-june 2005  . Infusion pump implantation      baclofen infusion in lower abd  . Venous ablation  12/16/10    radiofreq ablation -Dr Elisabeth Cara and [facility]  . Child births      X64  . Cardiac catheterization  06/22/10    normal coronary arteries, PAF  . Tonsillectomy    . Pain pump revision N/A 07/29/2014    Procedure: Baclofen pump replacement;  Surgeon: Erline Levine, MD;  Location: [facility];  Service: Neurosurgery;  Laterality: N/A;  Baclofen pump replacement    FAMHx:  Family History  Problem Relation Age of Onset  . Coronary artery disease Father     at age 43  . Coronary artery disease Maternal Grandmother   . Depression Maternal Grandmother   . Cancer Paternal Grandmother   . Depression Mother   . Alcoholism Son     SOCHx:   reports that she quit smoking about 39 years ago. She has never used smokeless tobacco. She reports that she drinks alcohol. She reports that she does not use illicit drugs.  ALLERGIES:  Allergies  Allergen Reactions  .  Oxycodone     Oral oxycodone causes vomiting  . Chlorhexidine Gluconate     Sores at site  . Zoloft [Sertraline] Hives, Swelling and Rash    ROS: A comprehensive review of systems was negative except for: Constitutional: positive for fatigue Respiratory: positive for dyspnea on exertion Cardiovascular: positive for lower extremity edema and palpitations Neurological: positive for neuropathy  HOME MEDS: Current Outpatient Prescriptions  Medication Sig Dispense Refill  . ALPRAZolam (XANAX) 0.5 MG tablet Take 1 tablet (0.5 mg total) by mouth 2 (two) times daily as needed for anxiety. 180 tablet 1  . baclofen (LIORESAL) 20 MG tablet Take 1 tablet (20 mg total) by mouth 3 (three) times daily as needed  for muscle spasms. 180 each 3  . buPROPion (WELLBUTRIN XL) 300 MG 24 hr tablet Take 300 mg by mouth daily.    . calcium carbonate (OS-CAL) 600 MG TABS tablet Take 600 mg by mouth 2 (two) times daily with a meal.    . cetirizine (ZYRTEC) 10 MG tablet Take 10 mg by mouth daily.    Marland Kitchen co-enzyme Q-10 30 MG capsule Take 30 mg by mouth daily.    . corticotropin (ATHACAR H.P.) 80 UNIT/ML injectable gel Inject 1 mL (80 Units total) into the skin every other day. To achieve control of relapsing MS Symptoms. (total of 5 injections per month) 5 mL 6  . fluticasone (FLONASE) 50 MCG/ACT nasal spray Use as needed for allergies    . furosemide (LASIX) 40 MG tablet Take 1.5 tablets (60 mg total) by mouth daily. Take 60 mg by mouth daily 135 tablet 3  . gabapentin (NEURONTIN) 300 MG capsule Take 300 mg by mouth. 3-4x/daily    . Lactulose 20 GM/30ML SOLN Take 7.5 mLs (5 g total) by mouth 2 (two) times daily. 900 mL 3  . levothyroxine (SYNTHROID, LEVOTHROID) 75 MCG tablet Take 75 mcg by mouth daily.    . Multiple Minerals-Vitamins (CALCIUM & VIT D3 BONE HEALTH PO) Take by mouth 2 (two) times daily. 1000/500mg     . Nebivolol HCl 20 MG TABS Take 20 mg by mouth daily.     . Omega-3 Fatty Acids (FISH OIL) 600 MG CAPS  Take 600 mg by mouth daily.     Marland Kitchen omeprazole (PRILOSEC) 20 MG capsule Take 20 mg by mouth daily.     . ranitidine (ZANTAC) 150 MG tablet Take 150 mg by mouth at bedtime.     . simvastatin (ZOCOR) 20 MG tablet Take 20 mg by mouth at bedtime.     . SYRINGE/NEEDLE, DISP, 1 ML (BD LUER-LOK SYRINGE) 25G X 5/8" 1 ML MISC Use as directed to inject  Acthar 10 each prn  . Tamsulosin HCl (FLOMAX) 0.4 MG CAPS Take 0.4 mg by mouth daily.    Marland Kitchen warfarin (COUMADIN) 5 MG tablet Take 1 tablet by mouth daily or as directed by coumadin clinic. 90 tablet 2   No current facility-administered medications for this visit.    LABS/IMAGING: Results for orders placed or performed in visit on 08/31/15 (from the past 48 hour(s))  POCT INR     Status: None   Collection Time: 08/31/15  2:15 PM  Result Value Ref Range   INR 2.2    No results found.  VITALS: BP 120/82 mmHg  Pulse 108  Ht 5\' 2"  (1.575 m)  EXAM: General appearance: alert, no distress, morbidly obese and In a wheelchair Neck: no carotid bruit, no JVD and Very thick neck Lungs: clear to auscultation bilaterally Heart: irregularly irregular rhythm Abdomen: soft, non-tender; bowel sounds normal; no masses,  no organomegaly Extremities: edema Trace to 1+ bilateral pedal edema Pulses: 2+ and symmetric Skin: Skin color, texture, turgor normal. No rashes or lesions Neurologic: Mental status: Alert, oriented, thought content appropriate Psych: Frustrated  EKG: Atrial fibrillation with rapid ventricular response at 108  ASSESSMENT: 1. Persistent atrial fibrillation - 80% frequency of A. Fib (not clear if symptomatic) 2. Morbid obesity 3. Obstructive sleep apnea on CPAP 4. Multiple sclerosis 5. Chronic anticoagulation on warfarin - INR 2.3 today 6. Chronic venous insufficiency 7. Hypertension-controlled  PLAN: 1.   Mrs. Decourcy is in persistent atrial fibrillation but is not clear if she is symptomatic. I plan  to continue rate control strategy. I  think he would be challenging to put her on antiarrhythmic therapy. Is not clear that she's had very well controlled sleep apnea, but that may be a problem with mask fit and other compliance issues. Her INR is been therapeutic. She is reporting worsening swelling. I would recommend increasing her Lasix to 60 mg daily. She is not taking her nocturnal dose. We'll go ahead per her request and repeat venous insufficiency studies to see if she has any worsening venous insufficiency particularly in the left leg as the right leg is ready been ablated with regards to the greater saphenous vein. Also, she has poor distal pulses and cool extremities. I suspect this is related to neuropathy, but I would like to check arterial Dopplers to rule out arterial insufficiency.  Plan follow-up in 4-6 weeks.  Pixie Casino, MD, [facility] Attending Cardiologist [place] [facility] [place] 08/31/2015, 6:06 PM

## 2015-09-02 ENCOUNTER — Other Ambulatory Visit: Payer: Self-pay | Admitting: Internal Medicine

## 2015-09-02 ENCOUNTER — Ambulatory Visit (HOSPITAL_COMMUNITY)
Admission: RE | Admit: 2015-09-02 | Discharge: 2015-09-02 | Disposition: A | Discharge status: Home / Self Care | Payer: Medicare Other | Source: Ambulatory Visit | Attending: Cardiology | Admitting: Cardiology

## 2015-09-02 DIAGNOSIS — I4891 Unspecified atrial fibrillation: Secondary | ICD-10-CM | POA: Insufficient documentation

## 2015-09-02 DIAGNOSIS — R600 Localized edema: Secondary | ICD-10-CM | POA: Diagnosis not present

## 2015-09-02 DIAGNOSIS — I10 Essential (primary) hypertension: Secondary | ICD-10-CM | POA: Insufficient documentation

## 2015-09-02 DIAGNOSIS — I83893 Varicose veins of bilateral lower extremities with other complications: Secondary | ICD-10-CM

## 2015-09-02 DIAGNOSIS — I872 Venous insufficiency (chronic) (peripheral): Secondary | ICD-10-CM

## 2015-09-02 DIAGNOSIS — Z7901 Long term (current) use of anticoagulants: Secondary | ICD-10-CM | POA: Insufficient documentation

## 2015-09-02 DIAGNOSIS — I2510 Atherosclerotic heart disease of native coronary artery without angina pectoris: Secondary | ICD-10-CM | POA: Diagnosis not present

## 2015-09-02 DIAGNOSIS — E785 Hyperlipidemia, unspecified: Secondary | ICD-10-CM | POA: Insufficient documentation

## 2015-09-02 DIAGNOSIS — Z87891 Personal history of nicotine dependence: Secondary | ICD-10-CM | POA: Diagnosis not present

## 2015-09-03 ENCOUNTER — Telehealth: Payer: Self-pay | Admitting: Neurology

## 2015-09-03 NOTE — Telephone Encounter (Signed)
Patient called back and spoke to [employer]. Appointment scheduled 10/08/15. I have entered the appointment.

## 2015-09-03 NOTE — Telephone Encounter (Signed)
Patient is calling to get an appointment for a Baclofen pump refill.

## 2015-09-03 NOTE — Telephone Encounter (Signed)
I called the patient and left a voicemail asking her to call me back.  

## 2015-09-06 ENCOUNTER — Telehealth: Payer: Self-pay | Admitting: *Deleted

## 2015-09-06 DIAGNOSIS — I739 Peripheral vascular disease, unspecified: Secondary | ICD-10-CM

## 2015-09-06 NOTE — Telephone Encounter (Signed)
-----   Message from Pixie Casino, MD sent at 09/04/2015  9:29 AM EST ----- Left greater saphenous vein shows significant reflux. There is superficial phlebitis, which may be chronic, in the right leg. No DVT. Unclear if left leg pain related to reflux. Please refer to Dr. Curt Jews (at [facility]) for evaluation of possible L-GSV ablation.  Dr. Lemmie Evens

## 2015-09-06 NOTE — Telephone Encounter (Signed)
Aware of results, referral placed for [facility].

## 2015-09-09 DIAGNOSIS — K592 Neurogenic bowel, not elsewhere classified: Secondary | ICD-10-CM | POA: Diagnosis not present

## 2015-09-09 DIAGNOSIS — N319 Neuromuscular dysfunction of bladder, unspecified: Secondary | ICD-10-CM | POA: Diagnosis not present

## 2015-09-09 DIAGNOSIS — I4891 Unspecified atrial fibrillation: Secondary | ICD-10-CM | POA: Diagnosis not present

## 2015-09-09 DIAGNOSIS — G35 Multiple sclerosis: Secondary | ICD-10-CM | POA: Diagnosis not present

## 2015-09-09 DIAGNOSIS — E039 Hypothyroidism, unspecified: Secondary | ICD-10-CM | POA: Diagnosis not present

## 2015-09-09 DIAGNOSIS — I10 Essential (primary) hypertension: Secondary | ICD-10-CM | POA: Diagnosis not present

## 2015-09-15 ENCOUNTER — Ambulatory Visit (INDEPENDENT_AMBULATORY_CARE_PROVIDER_SITE_OTHER): Payer: Medicare Other | Admitting: Pharmacist Clinician (PhC)/ Clinical Pharmacy Specialist

## 2015-09-15 DIAGNOSIS — I4819 Other persistent atrial fibrillation: Secondary | ICD-10-CM

## 2015-09-15 DIAGNOSIS — I481 Persistent atrial fibrillation: Secondary | ICD-10-CM

## 2015-09-15 NOTE — Progress Notes (Signed)
Patient came into office today because of bruising post lower extremity dopplers on January 12.  She has  2 bruises on her right calf in the area a saphenous clot was discovered - one is currently about the size of a thumbprint, the other about 2" in diameter.  Another bruise on her inner thigh on the same leg, again no more than 2" in diameter.  She also has bruises that are fading on her left leg, one at her ankle and one mid calf.  The ankle one has caused her some mild pain.  Neither bruise is more than 1.5-2" in diameter.   [name] was in the room with [name] and examined the bruises as well.  We have assured the patient that they should all heal fine and suggested applying heat for short amounts of time to help relieve any discomfort.  Patient is very agreeable to our conclusions, states she was just being overly cautious in having US examine them today.  Did check her INR since she was here, was slightly elevated at 3.3  Patient admits to running out of MVI about a week ago.  Will have her hold warfarin dose today, then resume at her normal schedule and re-start her MVI today as well.  Patient agreeable with decision.

## 2015-09-16 DIAGNOSIS — E039 Hypothyroidism, unspecified: Secondary | ICD-10-CM | POA: Diagnosis not present

## 2015-09-16 DIAGNOSIS — I4891 Unspecified atrial fibrillation: Secondary | ICD-10-CM | POA: Diagnosis not present

## 2015-09-16 DIAGNOSIS — G35 Multiple sclerosis: Secondary | ICD-10-CM | POA: Diagnosis not present

## 2015-09-16 DIAGNOSIS — N319 Neuromuscular dysfunction of bladder, unspecified: Secondary | ICD-10-CM | POA: Diagnosis not present

## 2015-09-16 DIAGNOSIS — K592 Neurogenic bowel, not elsewhere classified: Secondary | ICD-10-CM | POA: Diagnosis not present

## 2015-09-16 DIAGNOSIS — I10 Essential (primary) hypertension: Secondary | ICD-10-CM | POA: Diagnosis not present

## 2015-09-27 ENCOUNTER — Encounter: Payer: Medicare Other | Admitting: Pharmacist Clinician (PhC)/ Clinical Pharmacy Specialist

## 2015-09-28 ENCOUNTER — Emergency Department (HOSPITAL_COMMUNITY)
Admission: EM | Admit: 2015-09-28 | Discharge: 2015-09-28 | Discharge status: Against Medical Advice | Payer: Medicare Other | Attending: Emergency Medicine | Admitting: Emergency Medicine

## 2015-09-28 ENCOUNTER — Encounter (HOSPITAL_COMMUNITY): Payer: Self-pay | Admitting: Family Medicine

## 2015-09-28 ENCOUNTER — Telehealth: Payer: Self-pay | Admitting: Neurology

## 2015-09-28 DIAGNOSIS — I4891 Unspecified atrial fibrillation: Secondary | ICD-10-CM | POA: Diagnosis not present

## 2015-09-28 DIAGNOSIS — I10 Essential (primary) hypertension: Secondary | ICD-10-CM | POA: Insufficient documentation

## 2015-09-28 DIAGNOSIS — G8929 Other chronic pain: Secondary | ICD-10-CM | POA: Insufficient documentation

## 2015-09-28 DIAGNOSIS — R102 Pelvic and perineal pain: Secondary | ICD-10-CM | POA: Diagnosis not present

## 2015-09-28 DIAGNOSIS — R21 Rash and other nonspecific skin eruption: Secondary | ICD-10-CM | POA: Insufficient documentation

## 2015-09-28 DIAGNOSIS — Z79899 Other long term (current) drug therapy: Secondary | ICD-10-CM | POA: Diagnosis not present

## 2015-09-28 DIAGNOSIS — Z7901 Long term (current) use of anticoagulants: Secondary | ICD-10-CM | POA: Diagnosis not present

## 2015-09-28 DIAGNOSIS — Z888 Allergy status to other drugs, medicaments and biological substances status: Secondary | ICD-10-CM | POA: Diagnosis not present

## 2015-09-28 DIAGNOSIS — N760 Acute vaginitis: Secondary | ICD-10-CM | POA: Diagnosis not present

## 2015-09-28 DIAGNOSIS — Z87891 Personal history of nicotine dependence: Secondary | ICD-10-CM | POA: Diagnosis not present

## 2015-09-28 DIAGNOSIS — Z885 Allergy status to narcotic agent status: Secondary | ICD-10-CM | POA: Diagnosis not present

## 2015-09-28 DIAGNOSIS — G35 Multiple sclerosis: Secondary | ICD-10-CM | POA: Diagnosis not present

## 2015-09-28 DIAGNOSIS — E039 Hypothyroidism, unspecified: Secondary | ICD-10-CM | POA: Diagnosis not present

## 2015-09-28 NOTE — ED Notes (Signed)
Pt states she started having a genital rash about 3-4 days ago. Pt has been putting A&D ointment with no relief. Today, pt had her friend look at her genitals. Pt reports there is a "white sore place". Pt is taking LEVOFLOXACIN for a sinus infection.

## 2015-09-28 NOTE — Telephone Encounter (Signed)
Patient is asking if she can bring in her CPAP machine at her appointment need week and was advised to take to her medical supply company.

## 2015-09-30 DIAGNOSIS — K592 Neurogenic bowel, not elsewhere classified: Secondary | ICD-10-CM | POA: Diagnosis not present

## 2015-09-30 DIAGNOSIS — E039 Hypothyroidism, unspecified: Secondary | ICD-10-CM | POA: Diagnosis not present

## 2015-09-30 DIAGNOSIS — N319 Neuromuscular dysfunction of bladder, unspecified: Secondary | ICD-10-CM | POA: Diagnosis not present

## 2015-09-30 DIAGNOSIS — I10 Essential (primary) hypertension: Secondary | ICD-10-CM | POA: Diagnosis not present

## 2015-09-30 DIAGNOSIS — G35 Multiple sclerosis: Secondary | ICD-10-CM | POA: Diagnosis not present

## 2015-09-30 DIAGNOSIS — I4891 Unspecified atrial fibrillation: Secondary | ICD-10-CM | POA: Diagnosis not present

## 2015-10-01 ENCOUNTER — Encounter: Payer: Medicare Other | Admitting: Pharmacist Clinician (PhC)/ Clinical Pharmacy Specialist

## 2015-10-01 ENCOUNTER — Telehealth: Payer: Self-pay | Admitting: Neurology

## 2015-10-01 NOTE — Telephone Encounter (Signed)
Jack/[facility] [phone] called to advise patient's Home Health Physical Therapy orders expire next week, would like to extend for 1x week for 9 weeks. Is aware Dr. Brett Fairy and [name] are out of the office until Monday. Please call on Monday.

## 2015-10-04 ENCOUNTER — Encounter: Payer: Medicare Other | Admitting: Pharmacist Clinician (PhC)/ Clinical Pharmacy Specialist

## 2015-10-04 ENCOUNTER — Encounter: Payer: Self-pay | Admitting: Neurology

## 2015-10-04 ENCOUNTER — Ambulatory Visit (INDEPENDENT_AMBULATORY_CARE_PROVIDER_SITE_OTHER): Payer: Medicare Other | Admitting: Neurology

## 2015-10-04 VITALS — BP 122/86 | HR 82 | Resp 20

## 2015-10-04 DIAGNOSIS — G35 Multiple sclerosis: Secondary | ICD-10-CM

## 2015-10-04 DIAGNOSIS — I509 Heart failure, unspecified: Secondary | ICD-10-CM | POA: Diagnosis not present

## 2015-10-04 DIAGNOSIS — Z9989 Dependence on other enabling machines and devices: Principal | ICD-10-CM

## 2015-10-04 DIAGNOSIS — G4733 Obstructive sleep apnea (adult) (pediatric): Secondary | ICD-10-CM | POA: Diagnosis not present

## 2015-10-04 DIAGNOSIS — G4737 Central sleep apnea in conditions classified elsewhere: Secondary | ICD-10-CM

## 2015-10-04 DIAGNOSIS — A09 Infectious gastroenteritis and colitis, unspecified: Secondary | ICD-10-CM | POA: Insufficient documentation

## 2015-10-04 MED ORDER — CORTICOTROPIN 80 UNIT/ML IJ GEL: 80.0000 [IU] | INTRAMUSCULAR | Status: DC

## 2015-10-04 NOTE — Patient Instructions (Signed)

## 2015-10-04 NOTE — Progress Notes (Signed)
Provider:  [name], M D  Referring Provider: Prince Solian, MD Primary Care Physician:  [name], MD  Chief Complaint  Patient presents with  . 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 female seen here as a revisit for MS follow up.   The patient has had her baclofen pump replaced in December 2015 and March 2016 . The pump refill went well.  Before the pump was replaced, she had noted a higher and higher need for oral baclofen supplement. She uses ACTH to avoid the previously steroid insuced a fib , she has less weight gain and less HTN on the ACTH. Her right leg is weaker than last year, but she has not fallen. She is her for a routine visit , has no acute questions.  Her extensive history is re viewed in previous noted.   01-25-15 Is until is here for routine visit today and she brought me a beautiful Gardenia blossom. Her sleepiness score is endorsed at 8 points fatigue severity 34, we have today also a download from her [employer] auto system.   Unfortunately the data very concerning. By the patient is compliant with 4 hours and 25 minutes of daily use and 96.7% compliance 29 out of 30 days. She has had a mean pressure of 12 cm water peak pressure of 14 cm water but an average AHI of 37.9.  reports that her nose is congested and often doesn't allow unhindered airflow and that she sometimes has to take the mask off for that reason.  However this is in an extraordinary high AHI. Her machine doesn't differentiate between central and obstructives.  She reports using nasal spray and allergy medication to prevent rhinitis.  She will need to see ENT for this remaining nasal obstruction.  I will order the machine to be autotration set 5-15 cm water .  Continues with nasal pillows, chin strap for oral air leakage.     06-02-15 OSA : Now with [facility], where she was  given an AIRFIT F 10- a FFM.  She had a lot of air leaking by using a nasal pillow, but because of her dexterity problems in relation to MS it is much harder for her to use a full face mask with multiple straps buttons and release clips. I would like for her to return to whatever mask she can manipulate by herself the easiest, this would not be a dream wear , nor a FFM .  We will ignore the air leaks! Has a home pulseoximetry at home, she has sufficient 02 levels.  Neurogenic bladder :She still wakes up at night.-She wakes up every 2 hours with urinary bladder spasms. The patient was discussed prescribed hyoscyamine by her urologist, Dr. Gaynelle Arabian, but then the medication was recalled. She did not feel however for the short time that she used it but it gave her much benefit. She also has samples of scopolamine now at home she reports. She was also offered bladder spasm treatments with Botox but since she has to cath herself in order to undergo that treatment she is worried that her current dexterity would not allow for sufficient self-catheterization.  MS; Toni [name] fine motor skills and ambulatory skills have significantly declined over the last 5 years she is now mostly seen here in a wheelchair but she can  transfer. She continues to use the baclofen pump which is followed by Dr. [name]. Cognitive decline was a concern but today's Montral cognitive assessment test showed 30 out of 30 points and word fluency of 13 words. She also endorsed the Epworth sleepiness score at 11 points and the fatigue severity score was not endorsed.  Odema: severe ankle edema and veinous insufficiency. On diuretics. Not using compression stockings.  She has anasarca when she wakes first in the morning. She has low albumin. She takes a protein rich diet. She is losing muscle mass.  Osteopenia. Arthritis. T once a week for mobility maintenance. I will renew her prescription.   Interval history from the 13th of February  2017. My long-standing patient Toni [name] is here today for a revisit. This included a Montral cognitive assessment test which she aced with 29 out of 30 points. She endorsed the geriatric depression score at 5 points the fatigue severity score at 24 points, and the Epworth score at 14 points. She reports that she has a feeling her CPAP does not pick up flow when she holds her breath something that her old machine would have done. She was given this replacement by [facility] was originally set up with [facility]. Her new machine however seems not to give her the airflow she craves. In interrogation into the machine today was not possible as the memory chip was supposedly "corrupted". Montreal Cognitive Assessment  10/04/2015 06/02/2015 03/30/2015  Visuospatial/ Executive (0/5) 4 5 5   Naming (0/3) 3 3 3   Attention: Read list of digits (0/2) 2 2 2   Attention: Read list of letters (0/1) 1 1 1   Attention: Serial 7 subtraction starting at 100 (0/3) 3 3 3   Language: Repeat phrase (0/2) 2 2 2   Language : Fluency (0/1) 1 1 1   Abstraction (0/2) 2 2 2   Delayed Recall (0/5) 5 5 5   Orientation (0/6) 6 6 6   Total 29 30 30   Adjusted Score (based on education) 29 30 30         Review of Systems: Out of a complete 14 system review, the patient complains of only the following symptoms, and all other reviewed systems are negative. Wheelchair bound.  Now back at home after rehab with PT> carpal tunnel.  Epworth 8 and FSS 34.  Social History   Social History  . Marital Status: Married    Spouse Name: N/A  . Number of Children: 2  . Years of Education: COLLEGE   Occupational History  . RETIRED    Social History Main Topics  . Smoking status: Former Smoker    Quit date: 08/21/1976  . Smokeless tobacco: Never Used     Comment: QUIT 1978  . Alcohol Use: Yes     Comment: "Maybe a glass of wine everyone in a while"  . Drug Use: No  . Sexual Activity: Not on file   Other Topics Concern  .  Not on file   Social History Narrative   Lives in [place] with Tillman Sers [id number], [phone]          Family History  Problem Relation Age of Onset  . Coronary artery disease Father     at age 74  . Coronary artery disease Maternal Grandmother   . Depression Maternal Grandmother   . Cancer Paternal Grandmother   . Depression Mother   . Alcoholism Son     Past Medical History  Diagnosis Date  . Multiple sclerosis ([place])     has  had this may 1989-[facility] reg doc  . Hypertension   . PAF (paroxysmal atrial fibrillation) ([place])     on coumadin; documented on monitor 06/2010  . Thyroid disease   . HLD (hyperlipidemia)   . Shortness of breath   . Pneumonia   . Hyperthyroidism   . Edema     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' ablation of R GSV 2012  . Depression   . Anxiety   . Headache(784.0)     MIGRAINE   . Chronic pain   . Hx of echocardiogram 04/22/10    EF 55-60%, no valve issues  . History of stress test 06/21/10    limited exam with some degree of breast attenuatin of the apex however a component of apical ischemia is not excluded, EF 62%; cardiac cath   . Sleep apnea     cpap  . Abnormality of gait 03/01/2015    Current Outpatient Prescriptions  Medication Sig Dispense Refill  . ALPRAZolam (XANAX) 0.5 MG tablet Take 1 tablet (0.5 mg total) by mouth 2 (two) times daily as needed for anxiety. 180 tablet 1  . baclofen (LIORESAL) 20 MG tablet Take 1 tablet (20 mg total) by mouth 3 (three) times daily as needed for muscle spasms. 180 each 3  . buPROPion (WELLBUTRIN XL) 300 MG 24 hr tablet Take 300 mg by mouth daily.    . calcium carbonate (OS-CAL) 600 MG TABS tablet Take 600 mg by mouth 2 (two) times daily with a meal.    . cetirizine (ZYRTEC) 10 MG tablet Take 10 mg by mouth daily.    Marland Kitchen co-enzyme Q-10 30 MG capsule Take 30 mg by mouth daily.    . corticotropin (ATHACAR H.P.) 80 UNIT/ML injectable gel Inject 1 mL (80 Units total) into the skin every  other day. To achieve control of relapsing MS Symptoms. (total of 5 injections per month) 5 mL 6  . fluticasone (FLONASE) 50 MCG/ACT nasal spray Use as needed for allergies    . furosemide (LASIX) 40 MG tablet Take 1.5 tablets (60 mg total) by mouth daily. Take 60 mg by mouth daily 135 tablet 3  . gabapentin (NEURONTIN) 300 MG capsule Take 300 mg by mouth. 3-4x/daily    . Lactulose 20 GM/30ML SOLN Take 7.5 mLs (5 g total) by mouth 2 (two) times daily. 900 mL 3  . levothyroxine (SYNTHROID, LEVOTHROID) 75 MCG tablet Take 75 mcg by mouth daily.    . Multiple Minerals-Vitamins (CALCIUM & VIT D3 BONE HEALTH PO) Take by mouth 2 (two) times daily. 1000/500mg     . Nebivolol HCl 20 MG TABS Take 20 mg by mouth daily.     . Omega-3 Fatty Acids (FISH OIL) 600 MG CAPS Take 600 mg by mouth daily.     Marland Kitchen omeprazole (PRILOSEC) 20 MG capsule Take 20 mg by mouth daily.     . ranitidine (ZANTAC) 150 MG tablet Take 150 mg by mouth at bedtime.     . simvastatin (ZOCOR) 20 MG tablet Take 20 mg by mouth at bedtime.     . SYRINGE/NEEDLE, DISP, 1 ML (BD LUER-LOK SYRINGE) 25G X 5/8" 1 ML MISC Use as directed to inject  Acthar 10 each prn  . Tamsulosin HCl (FLOMAX) 0.4 MG CAPS Take 0.4 mg by mouth daily.    Marland Kitchen warfarin (COUMADIN) 5 MG tablet Take 1 tablet by mouth daily or as directed by coumadin clinic. 90 tablet 2   No current facility-administered medications  for this visit.    Allergies as of 10/04/2015 - Review Complete 10/04/2015  Allergen Reaction Noted  . Oxycodone  05/12/2013  . Chlorhexidine gluconate  11/26/2011  . Zoloft [sertraline] Hives, Swelling, and Rash 05/15/2013    Vitals: BP 122/86 mmHg  Pulse 82  Resp 20  Ht   Wt  Last Weight:  Wt Readings from Last 1 Encounters:  09/28/15 260 lb (117.935 kg)   Last Height:   Ht Readings from Last 1 Encounters:  09/28/15 5\' 3"  (1.6 m)    Physical exam:   Height ;  5.4 ", 260 pounds  General: The patient is awake, alert and appears not in acute  distress. The patient is well groomed.  Head: Normocephalic, atraumatic.  Neck is supple. Mallampati 4 , with lateral narrowing.  neck circumference:19. 25  inches .  Cardiovascular: Regular rate and rhythm today, without murmurs or carotid bruit, and without distended neck veins.  Respiratory: Lungs are clear to auscultation.  Skin: evidence of edema, not Rash, Injection site reaction.  Trunk: BMI is elevated. Neurologic exam :  The patient is awake and alert, oriented to place and time.  Memory subjective described as intact. There is a normal attention span & concentration ability.  Speech without dysarthria, dysphonia , delayed wordfinding- aphasia.  Mood and affect are appropriate.   Cranial nerves:  Pupils are equal and briskly reactive to light. Extraocular movements in vertical and horizontal planes intact and without nystagmus.  Visual fields by finger perimetry are intact.  Hearing to finger rub intact. Facial sensation intact to fine touch.  Facial motor strength is symmetric and tongue and uvula move midline.   Deep tendon reflexes: in the upper and lower extremities are attenuated, right Babinski positive- up going.   ASSESSMENT AND PLAN  70 y.o. year old female Patient with secondary progressive  multiple sclerosis; Hypertension; PAF (paroxysmal atrial fibrillation); Thyroid disease; obesity hypoventilation,   Pneumonia; Sleep apnea; Hyperthyroidism; Edema;    Acthar re -established. Causes bruising easliy but keeps her from relapses, and she is less fatigued.  PT to continue CPAP to be checked by Jonni Sanger, [place] normal.      PLAN :  Continue ACTHAR 2 days a week - she has atrial fibrillation again, concerned that she needs an ablation. Acthar is less likely contributing to a -fib, as did her steroid pulse.   The PT has noted improvement while on ACTHAR. She reports better sleep, better bowl control and bladder control. I like her to continue on this medication.  5  in a row - Can be used each month, [facility]  Continues with nasal pillows, chin strap for oral air leakage.  DME [facility]-  please allow her to change to a an auto set CPAP .    Here with for routine followup of Secondary Progressive MS , with Spasticity on baclofen. Continue Baclofen PUMP with Dr [name].   Keep next follow up appointment alternating with me or NP in 4 months. No MRI - she is not interested  In yet another imaging study,  She will have a blood draw October, CBC and CMET .   Asencion Partridge Haruna Rohlfs MD  10/04/2015

## 2015-10-05 ENCOUNTER — Encounter: Payer: Self-pay | Admitting: Vascular Surgery

## 2015-10-05 ENCOUNTER — Encounter: Payer: Medicare Other | Admitting: Vascular Surgery

## 2015-10-05 ENCOUNTER — Ambulatory Visit (INDEPENDENT_AMBULATORY_CARE_PROVIDER_SITE_OTHER): Payer: Medicare Other | Admitting: Pharmacist Clinician (PhC)/ Clinical Pharmacy Specialist

## 2015-10-05 DIAGNOSIS — I481 Persistent atrial fibrillation: Secondary | ICD-10-CM

## 2015-10-05 DIAGNOSIS — I10 Essential (primary) hypertension: Secondary | ICD-10-CM | POA: Diagnosis not present

## 2015-10-05 DIAGNOSIS — N319 Neuromuscular dysfunction of bladder, unspecified: Secondary | ICD-10-CM | POA: Diagnosis not present

## 2015-10-05 DIAGNOSIS — G35 Multiple sclerosis: Secondary | ICD-10-CM | POA: Diagnosis not present

## 2015-10-05 DIAGNOSIS — Z7901 Long term (current) use of anticoagulants: Secondary | ICD-10-CM | POA: Diagnosis not present

## 2015-10-05 DIAGNOSIS — E039 Hypothyroidism, unspecified: Secondary | ICD-10-CM | POA: Diagnosis not present

## 2015-10-05 DIAGNOSIS — I4819 Other persistent atrial fibrillation: Secondary | ICD-10-CM

## 2015-10-05 DIAGNOSIS — K592 Neurogenic bowel, not elsewhere classified: Secondary | ICD-10-CM | POA: Diagnosis not present

## 2015-10-05 DIAGNOSIS — I4891 Unspecified atrial fibrillation: Secondary | ICD-10-CM | POA: Diagnosis not present

## 2015-10-05 LAB — POCT INR: INR: 3.9

## 2015-10-05 NOTE — Telephone Encounter (Signed)
Jack/[facility] [phone] called said pt's will PT will expire tomorrow. Please call with verbal order.

## 2015-10-05 NOTE — Telephone Encounter (Signed)
Spoke to Dr. Brett Fairy and received a verbal order to continue with pt's PT. I called Barnabas Lister, PT back and gave verbal order to extend PT for 1x week for 9 weeks. Barnabas Lister verbalized understanding.

## 2015-10-07 ENCOUNTER — Encounter: Payer: Self-pay | Admitting: Neurology

## 2015-10-07 DIAGNOSIS — Z7901 Long term (current) use of anticoagulants: Secondary | ICD-10-CM | POA: Diagnosis not present

## 2015-10-07 DIAGNOSIS — E785 Hyperlipidemia, unspecified: Secondary | ICD-10-CM | POA: Diagnosis not present

## 2015-10-07 DIAGNOSIS — E039 Hypothyroidism, unspecified: Secondary | ICD-10-CM | POA: Diagnosis not present

## 2015-10-07 DIAGNOSIS — G35 Multiple sclerosis: Secondary | ICD-10-CM | POA: Diagnosis not present

## 2015-10-07 DIAGNOSIS — N319 Neuromuscular dysfunction of bladder, unspecified: Secondary | ICD-10-CM | POA: Diagnosis not present

## 2015-10-07 DIAGNOSIS — I10 Essential (primary) hypertension: Secondary | ICD-10-CM | POA: Diagnosis not present

## 2015-10-07 DIAGNOSIS — I4891 Unspecified atrial fibrillation: Secondary | ICD-10-CM | POA: Diagnosis not present

## 2015-10-07 DIAGNOSIS — K592 Neurogenic bowel, not elsewhere classified: Secondary | ICD-10-CM | POA: Diagnosis not present

## 2015-10-08 ENCOUNTER — Ambulatory Visit (INDEPENDENT_AMBULATORY_CARE_PROVIDER_SITE_OTHER): Payer: Medicare Other | Admitting: Neurology

## 2015-10-08 ENCOUNTER — Encounter: Payer: Self-pay | Admitting: Neurology

## 2015-10-08 VITALS — BP 124/67 | HR 110

## 2015-10-08 DIAGNOSIS — G35 Multiple sclerosis: Secondary | ICD-10-CM

## 2015-10-08 DIAGNOSIS — M6249 Contracture of muscle, multiple sites: Secondary | ICD-10-CM

## 2015-10-08 DIAGNOSIS — M62838 Other muscle spasm: Secondary | ICD-10-CM

## 2015-10-08 MED ORDER — BACLOFEN 40000 MCG/20ML IT SOLN
80000.0000 ug | Freq: Once | INTRATHECAL | Status: AC
  Administered 2015-10-08: 80000 ug via INTRATHECAL

## 2015-10-08 NOTE — Procedures (Signed)
      History:  Toni [name] is a 70 year old patient with a history of multiple sclerosis with a spastic quadriparesis. The patient has a baclofen pump in place, she comes in for a baclofen pump refill. She has noted increased muscle tone in the legs at nighttime, she wants to have the pump rate increased at this time.  Baclofen pump refill note  The baclofen pump site was cleaned with Betadine solution. A 21-gauge needle was inserted into the pump port site. Approximately 5.5 cc of residual baclofen was removed, the pump indicates that there is a 3.8 mL residual. 40 cc of replacement baclofen was placed into the pump at 2000 mcg/cc concentration.  The pump was reprogrammed for the following settings: The pump rate from 1 AM to 5 AM was increased from 24.8 g per hour to 28.0 mcg/h with a total dose of 139.9 g during this period of time, increased from 123.9 g. The pump rate otherwise is at 30.0 mcg/h for a total daily dose of 681.9 g per day, up from 665.9 g per day.  The alarm volume is set at 1.5 mL. The next alarm date is 01/28/2016.  The patient tolerated the procedure well. There were no complications of the above procedure.  The [place] number is [phone] The baclofen expiration date is 05/19. The baclofen lot number is [id number].

## 2015-10-12 ENCOUNTER — Encounter: Payer: Medicare Other | Admitting: Vascular Surgery

## 2015-10-14 DIAGNOSIS — R8299 Other abnormal findings in urine: Secondary | ICD-10-CM | POA: Diagnosis not present

## 2015-10-14 DIAGNOSIS — N390 Urinary tract infection, site not specified: Secondary | ICD-10-CM | POA: Diagnosis not present

## 2015-10-18 ENCOUNTER — Ambulatory Visit (INDEPENDENT_AMBULATORY_CARE_PROVIDER_SITE_OTHER): Payer: Medicare Other | Admitting: Pharmacist Clinician (PhC)/ Clinical Pharmacy Specialist

## 2015-10-18 ENCOUNTER — Ambulatory Visit (INDEPENDENT_AMBULATORY_CARE_PROVIDER_SITE_OTHER): Payer: Medicare Other | Admitting: Internal Medicine

## 2015-10-18 ENCOUNTER — Encounter: Payer: Self-pay | Admitting: Internal Medicine

## 2015-10-18 VITALS — BP 126/80 | HR 54

## 2015-10-18 DIAGNOSIS — Z7901 Long term (current) use of anticoagulants: Secondary | ICD-10-CM | POA: Diagnosis not present

## 2015-10-18 DIAGNOSIS — I4819 Other persistent atrial fibrillation: Secondary | ICD-10-CM

## 2015-10-18 DIAGNOSIS — I481 Persistent atrial fibrillation: Secondary | ICD-10-CM

## 2015-10-18 DIAGNOSIS — I83893 Varicose veins of bilateral lower extremities with other complications: Secondary | ICD-10-CM | POA: Diagnosis not present

## 2015-10-18 DIAGNOSIS — I509 Heart failure, unspecified: Secondary | ICD-10-CM

## 2015-10-18 DIAGNOSIS — G35 Multiple sclerosis: Secondary | ICD-10-CM | POA: Diagnosis not present

## 2015-10-18 DIAGNOSIS — G4737 Central sleep apnea in conditions classified elsewhere: Secondary | ICD-10-CM

## 2015-10-18 LAB — POCT INR: INR: 2.7

## 2015-10-18 NOTE — Patient Instructions (Signed)
Your physician wants you to follow-up in: 6 months with Dr. Hilty. You will receive a reminder letter in the mail two months in advance. If you don't receive a letter, please call our office to schedule the follow-up appointment.    

## 2015-10-18 NOTE — Progress Notes (Signed)
OFFICE NOTE  Chief Complaint:  Follow-up dopplers  Primary Care Physician: [name], MD  HPI:  Toni [name] is a 70 year old female with multiple sclerosis, paroxysmal atrial fibrillation, on Coumadin, morbid obesity, obstructive sleep apnea, and dyslipidemia. She has had worsening swelling in her legs, thought to be due to an MS flare. She has a history of varicose veins with severe venous insufficiency in both the right and left greater saphenous veins. The right greater saphenous vein was actually successfully ablated by my partner, Dr. Elisabeth Cara, about 1 to 2 years ago. She does have residual reflux in the left greater saphenous vein extending from the proximal calf up to the saphenofemoral junction. The vein measured between 4 and 8 mm with between 1 and 6 seconds of reflux. We had discussed ablation of that before, but she has canceled several times at procedures. At this point, I think there is little benefit to continuing to try for her to undergo ablation. 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 She can wear work showed a compression stockings and this does seem to help her. With regards to her atrial fibrillation she has not had significant recurrence to her knowledge. She continues on warfarin and has been therapeutic, with her INR of 1.9 in the office by fingerstick today.  Toni [name] returns today for followup. She reports her MS is somewhat progressive. She continues to have some enlargement of her legs but no significant swelling. There is a mild varicosities in the right lower extremity. She is unaware of her atrial fibrillation. INR today was 2.3.   I saw Toni [name] back in the office today,  She is describing more shortness of breath and feels that she's been in A. Fib more recently. In fact today her EKG does show persistent atrial fibrillation. In the past she's been paroxysmal , but it may be that she is more frequently  having A. Fib. She has trouble telling whether not she is more short of breath or symptomatic from A. Fib versus her multiple sclerosis. She is also reporting more swelling in her left leg.  Toni [name] returns for follow-up today. She recently saw her neurologist regarding her MS which seems to be flaring. In addition she was noted to have significant apneic events despite using CPAP. There is concern for possible central sleep apnea. Both of these episodes are probably contributing to her recurrent atrial fibrillation. She's been wearing a monitor for a period of time which indicates a burden of A. fib of about 80% of the time. It does not appear that she is in persistent A. fib, rather she has had some episodes of sinus rhythm, but they're much less frequent than her A. Fib.  I saw [name] back today in the office. Recently I saw her as an add-on visit briefly as she was describing some chest pain when she was getting her INR checked. An EKG at that time showed no ischemia. I did not feel that this was cardiac. She continues intermittently have abnormal pains both in her chest as well as her abdomen and extremities. This may be related to MS. She's had difficulty with CPAP and apparently getting a good mask fit. She has been a persistent atrial fibrillation for about 6 months. It's unclear whether or not she symptomatic with this and therefore I have not pursued a rhythm control strategy. Her rate control is generally pretty good and she has been therapeutic  on warfarin. She is reporting worsening swelling of her legs although her weight she says is been stable. She has a history of venous insufficiency and had a right greater saphenous vein ablation by one of my partners. She's concerned about more swelling in her left leg and whether she's developed worsening venous insufficiency. She also reports cold feet with numbness in her extremities. As she is basically wheelchair bound, I cannot assess whether she  is having any claudication.  Toni [name] returns today for follow-up. She underwent venous and arterial lower external Dopplers. Arterial Dopplers were normal which showed no evidence of obstruction. Her venous Doppler showed no DVT. There was significant venous reflux in the left greater saphenous vein. This is an area where she has swelling and discomfort. This may be amenable to treatment. I increased her Lasix and she reports some improvement in her leg swelling. She's trying to elevate her feet and reports she is intolerant of wearing compression stockings.  PMHx:  Past Medical History  Diagnosis Date  . Multiple sclerosis ([place])     has had this may 1989-[facility] reg doc  . Hypertension   . PAF (paroxysmal atrial fibrillation) ([place])     on coumadin; documented on monitor 06/2010  . Thyroid disease   . HLD (hyperlipidemia)   . Shortness of breath   . Pneumonia   . Hyperthyroidism   . Edema     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' ablation of R GSV 2012  . Depression   . Anxiety   . Headache(784.0)     MIGRAINE   . Chronic pain   . Hx of echocardiogram 04/22/10    EF 55-60%, no valve issues  . History of stress test 06/21/10    limited exam with some degree of breast attenuatin of the apex however a component of apical ischemia is not excluded, EF 62%; cardiac cath   . Sleep apnea     cpap  . Abnormality of gait 03/01/2015    Past Surgical History  Procedure Laterality Date  . Cervical fusion      with correction-june 2005  . Infusion pump implantation      baclofen infusion in lower abd  . Venous ablation  12/16/10    radiofreq ablation -Dr Elisabeth Cara and [facility]  . Child births      X78  . Cardiac catheterization  06/22/10    normal coronary arteries, PAF  . Tonsillectomy    . Pain pump revision N/A 07/29/2014    Procedure: Baclofen pump replacement;  Surgeon: Erline Levine, MD;  Location: [place] [facility];  Service: Neurosurgery;  Laterality: N/A;  Baclofen pump  replacement    FAMHx:  Family History  Problem Relation Age of Onset  . Coronary artery disease Father     at age 54  . Coronary artery disease Maternal Grandmother   . Depression Maternal Grandmother   . Cancer Paternal Grandmother   . Depression Mother   . Alcoholism Son     SOCHx:   reports that she quit smoking about 39 years ago. She has never used smokeless tobacco. She reports that she drinks alcohol. She reports that she does not use illicit drugs.  ALLERGIES:  Allergies  Allergen Reactions  . Oxycodone     Oral oxycodone causes vomiting  . Chlorhexidine Gluconate     Sores at site  . Zoloft [Sertraline] Hives, Swelling and Rash    ROS: A comprehensive review of systems was negative  except for: Constitutional: positive for fatigue Respiratory: positive for dyspnea on exertion Cardiovascular: positive for lower extremity edema and palpitations Neurological: positive for neuropathy  HOME MEDS: Current Outpatient Prescriptions  Medication Sig Dispense Refill  . ALPRAZolam (XANAX) 0.5 MG tablet Take 1 tablet (0.5 mg total) by mouth 2 (two) times daily as needed for anxiety. 180 tablet 1  . baclofen (LIORESAL) 20 MG tablet Take 1 tablet (20 mg total) by mouth 3 (three) times daily as needed for muscle spasms. 180 each 3  . buPROPion (WELLBUTRIN XL) 300 MG 24 hr tablet Take 300 mg by mouth daily.    . cetirizine (ZYRTEC) 10 MG tablet Take 10 mg by mouth daily.    . Cholecalciferol (VITAMIN D) 2000 units tablet Take 2,000 Units by mouth 2 (two) times daily.    . ciprofloxacin (CIPRO) 250 MG tablet Take 1 tablet by mouth daily.    Marland Kitchen co-enzyme Q-10 30 MG capsule Take 30 mg by mouth daily.    . corticotropin (ATHACAR H.P.) 80 UNIT/ML injectable gel Inject 1 mL (80 Units total) into the skin every other day. To achieve control of relapsing MS Symptoms. (total of 5 injections per month) 5 mL 6  . fluticasone (FLONASE) 50 MCG/ACT nasal spray Use as needed for allergies    .  furosemide (LASIX) 40 MG tablet Take 1.5 tablets (60 mg total) by mouth daily. Take 60 mg by mouth daily 135 tablet 3  . gabapentin (NEURONTIN) 300 MG capsule Take 300 mg by mouth. 3-4x/daily    . Lactulose 20 GM/30ML SOLN Take 7.5 mLs (5 g total) by mouth 2 (two) times daily. 900 mL 3  . levothyroxine (SYNTHROID, LEVOTHROID) 75 MCG tablet Take 75 mcg by mouth daily.    . Multiple Minerals-Vitamins (CALCIUM & VIT D3 BONE HEALTH PO) Take by mouth 2 (two) times daily. 1000/500mg     . Multiple Vitamins-Minerals (MULTIVITAMIN ADULT PO) Take 1 tablet by mouth daily.    . Nebivolol HCl 20 MG TABS Take 20 mg by mouth daily.     . Omega-3 Fatty Acids (FISH OIL) 600 MG CAPS Take 600 mg by mouth daily.     Marland Kitchen omeprazole (PRILOSEC) 20 MG capsule Take 20 mg by mouth daily.     . ranitidine (ZANTAC) 150 MG tablet Take 150 mg by mouth at bedtime.     . simvastatin (ZOCOR) 20 MG tablet Take 20 mg by mouth at bedtime.     . SYRINGE/NEEDLE, DISP, 1 ML (BD LUER-LOK SYRINGE) 25G X 5/8" 1 ML MISC Use as directed to inject  Acthar 10 each prn  . Tamsulosin HCl (FLOMAX) 0.4 MG CAPS Take 0.4 mg by mouth daily.    Marland Kitchen warfarin (COUMADIN) 5 MG tablet Take 1 tablet by mouth daily or as directed by coumadin clinic. 90 tablet 2   No current facility-administered medications for this visit.    LABS/IMAGING: Results for orders placed or performed in visit on 10/18/15 (from the past 48 hour(s))  POCT INR     Status: None   Collection Time: 10/18/15  2:54 PM  Result Value Ref Range   INR 2.7    No results found.  VITALS: BP 126/80 mmHg  Pulse 54  EXAM: Deferred  EKG: Deferred  ASSESSMENT: 1. Persistent atrial fibrillation - 80% frequency of A. Fib (not clear if symptomatic) 2. Morbid obesity 3. Obstructive sleep apnea on CPAP 4. Multiple sclerosis 5. Chronic anticoagulation on warfarin - INR 2.3 today 6. Chronic venous insufficiency - +  LGSV reflux 7. Hypertension-controlled  PLAN: 1.   Mrs. Prins has had  some improvement on increased dose Lasix. We'll continue her on 60 mg daily. She does have left upper leg greater saphenous vein reflux. She seems to be symptomatic with this and may benefit from ablation or ligation. I've referred her to Dr. early for this. Blood pressure appears to be well-controlled. She remains in A. fib but is questionably symptomatic with it. She is appropriately anticoagulated. She still having trouble fitting CPAP and that is being chordae by her neurologist. Plan to see her back in 6 months.  Pixie Casino, MD, [facility] Attending Cardiologist Sabula C Hilty 10/18/2015, 4:41 PM

## 2015-10-21 DIAGNOSIS — E039 Hypothyroidism, unspecified: Secondary | ICD-10-CM | POA: Diagnosis not present

## 2015-10-21 DIAGNOSIS — N319 Neuromuscular dysfunction of bladder, unspecified: Secondary | ICD-10-CM | POA: Diagnosis not present

## 2015-10-21 DIAGNOSIS — G35 Multiple sclerosis: Secondary | ICD-10-CM | POA: Diagnosis not present

## 2015-10-21 DIAGNOSIS — I4891 Unspecified atrial fibrillation: Secondary | ICD-10-CM | POA: Diagnosis not present

## 2015-10-21 DIAGNOSIS — I10 Essential (primary) hypertension: Secondary | ICD-10-CM | POA: Diagnosis not present

## 2015-10-21 DIAGNOSIS — K592 Neurogenic bowel, not elsewhere classified: Secondary | ICD-10-CM | POA: Diagnosis not present

## 2015-10-25 DIAGNOSIS — G35 Multiple sclerosis: Secondary | ICD-10-CM | POA: Diagnosis not present

## 2015-10-25 DIAGNOSIS — I4891 Unspecified atrial fibrillation: Secondary | ICD-10-CM | POA: Diagnosis not present

## 2015-10-25 DIAGNOSIS — E039 Hypothyroidism, unspecified: Secondary | ICD-10-CM | POA: Diagnosis not present

## 2015-10-25 DIAGNOSIS — I10 Essential (primary) hypertension: Secondary | ICD-10-CM | POA: Diagnosis not present

## 2015-10-28 ENCOUNTER — Other Ambulatory Visit: Payer: Self-pay | Admitting: Neurology

## 2015-10-28 DIAGNOSIS — K592 Neurogenic bowel, not elsewhere classified: Secondary | ICD-10-CM | POA: Diagnosis not present

## 2015-10-28 DIAGNOSIS — I10 Essential (primary) hypertension: Secondary | ICD-10-CM | POA: Diagnosis not present

## 2015-10-28 DIAGNOSIS — I4891 Unspecified atrial fibrillation: Secondary | ICD-10-CM | POA: Diagnosis not present

## 2015-10-28 DIAGNOSIS — E039 Hypothyroidism, unspecified: Secondary | ICD-10-CM | POA: Diagnosis not present

## 2015-10-28 DIAGNOSIS — N319 Neuromuscular dysfunction of bladder, unspecified: Secondary | ICD-10-CM | POA: Diagnosis not present

## 2015-10-28 DIAGNOSIS — G35 Multiple sclerosis: Secondary | ICD-10-CM | POA: Diagnosis not present

## 2015-10-29 ENCOUNTER — Encounter: Payer: Self-pay | Admitting: Vascular Surgery

## 2015-11-01 ENCOUNTER — Telehealth: Payer: Self-pay | Admitting: Neurology

## 2015-11-01 DIAGNOSIS — G609 Hereditary and idiopathic neuropathy, unspecified: Secondary | ICD-10-CM

## 2015-11-01 MED ORDER — GABAPENTIN 300 MG PO CAPS: 300.0000 mg | ORAL_CAPSULE | Freq: Three times a day (TID) | ORAL | Status: DC

## 2015-11-01 NOTE — Telephone Encounter (Signed)
[facility] has not prescribed this med since [place], NP was here. Do you want to resume gabapentin 300mg  3-4 times daily?

## 2015-11-01 NOTE — Telephone Encounter (Signed)
Patient is calling to get a new Rx gabapentin 300 mg capsules sent to [facility].  Thanks!

## 2015-11-02 ENCOUNTER — Ambulatory Visit (INDEPENDENT_AMBULATORY_CARE_PROVIDER_SITE_OTHER): Payer: Medicare Other | Admitting: Vascular Surgery

## 2015-11-02 ENCOUNTER — Encounter: Payer: Self-pay | Admitting: Vascular Surgery

## 2015-11-02 VITALS — BP 124/86 | HR 81 | Temp 97.2°F | Resp 18 | Ht 63.0 in | Wt 260.0 lb

## 2015-11-02 DIAGNOSIS — I83813 Varicose veins of bilateral lower extremities with pain: Secondary | ICD-10-CM | POA: Diagnosis not present

## 2015-11-02 NOTE — Progress Notes (Signed)
[facility] [place]  Patient name: Toni [name] MRN: [id number] DOB: 1946-06-21 Sex: female  REASON FOR CONSULT: Bilateral leg swelling  HPI: Toni [name] is a 70 y.o. female, who who presents for evaluation of bilateral leg swelling, left greater than right. She states that she has pain in her calves and thighs bilaterally. She has been referred here by her cardiologist, Dr. Debara Pickett. Her outpatient intravenous reflux exam reveals left great saphenous reflux without evidence of deep venous reflux. She has history of ablation of the right great saphenous vein in 2012 and prior to that vein stripping in her right leg. She states that after her right leg procedures, her swelling improved. She has a prior history of left popliteal DVT over 30 years ago. The patient has an extensive past medical history including MS, atrial fibrillation on Coumadin, and hypertension.  She is mostly in her wheelchair. She uses a walker for transfers.   Past Medical History  Diagnosis Date  . Multiple sclerosis ([place])     has had this may 1989-[facility] reg doc  . Hypertension   . PAF (paroxysmal atrial fibrillation) ([place])     on coumadin; documented on monitor 06/2010  . Thyroid disease   . HLD (hyperlipidemia)   . Shortness of breath   . Pneumonia   . Hyperthyroidism   . Edema     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' ablation of R GSV 2012  . Depression   . Anxiety   . Headache(784.0)     MIGRAINE   . Chronic pain   . Hx of echocardiogram 04/22/10    EF 55-60%, no valve issues  . History of stress test 06/21/10    limited exam with some degree of breast attenuatin of the apex however a component of apical ischemia is not excluded, EF 62%; cardiac cath   . Sleep apnea     cpap  . Abnormality of gait 03/01/2015  . DVT (deep venous thrombosis) ([place])     1983    Family History  Problem Relation Age of Onset  . Coronary artery disease Father     at age 3    . Coronary artery disease Maternal Grandmother   . Depression Maternal Grandmother   . Cancer Paternal Grandmother   . Depression Mother   . Alcoholism Son     SOCIAL HISTORY: Social History   Social History  . Marital Status: Married    Spouse Name: N/A  . Number of Children: 2  . Years of Education: COLLEGE   Occupational History  . RETIRED    Social History Main Topics  . Smoking status: Former Smoker    Quit date: 08/21/1976  . Smokeless tobacco: Never Used     Comment: QUIT 1978  . Alcohol Use: Yes     Comment: "Maybe a glass of wine everyone in a while"  . Drug Use: No  . Sexual Activity: Not on file   Other Topics Concern  . Not on file   Social History Narrative   Lives in [place] with Tillman Sers [phone], [phone]          Allergies  Allergen Reactions  . Oxycodone     Oral oxycodone causes vomiting  . Chlorhexidine Gluconate     Sores at site  . Zoloft [Sertraline] Hives, Swelling and Rash    Current Outpatient Prescriptions  Medication Sig Dispense Refill  . ALPRAZolam (XANAX) 0.5 MG tablet Take  1 tablet (0.5 mg total) by mouth 2 (two) times daily as needed for anxiety. 180 tablet 1  . amoxicillin (AMOXIL) 500 MG capsule Take 500 mg by mouth 3 (three) times daily.    . baclofen (LIORESAL) 20 MG tablet Take 1 tablet (20 mg total) by mouth 3 (three) times daily as needed for muscle spasms. 180 each 3  . buPROPion (WELLBUTRIN XL) 300 MG 24 hr tablet Take 300 mg by mouth daily.    . cetirizine (ZYRTEC) 10 MG tablet Take 10 mg by mouth daily.    . Cholecalciferol (VITAMIN D) 2000 units tablet Take 2,000 Units by mouth 2 (two) times daily.    Marland Kitchen co-enzyme Q-10 30 MG capsule Take 30 mg by mouth daily.    . corticotropin (ATHACAR H.P.) 80 UNIT/ML injectable gel Inject 1 mL (80 Units total) into the skin every other day. To achieve control of relapsing MS Symptoms. (total of 5 injections per month) 5 mL 6  . fluticasone (FLONASE) 50 MCG/ACT nasal spray Use  as needed for allergies    . furosemide (LASIX) 40 MG tablet Take 1.5 tablets (60 mg total) by mouth daily. Take 60 mg by mouth daily 135 tablet 3  . gabapentin (NEURONTIN) 300 MG capsule Take 1 capsule (300 mg total) by mouth 3 (three) times daily. 3-4x/daily 360 capsule 3  . lactulose, encephalopathy, (CHRONULAC) 10 GM/15ML SOLN Take 1 to 2 tablespoonfuls  daily as directed 946 mL o  . levothyroxine (SYNTHROID, LEVOTHROID) 75 MCG tablet Take 75 mcg by mouth daily.    . Multiple Minerals-Vitamins (CALCIUM & VIT D3 BONE HEALTH PO) Take by mouth 2 (two) times daily. 1000/500mg     . Multiple Vitamins-Minerals (MULTIVITAMIN ADULT PO) Take 1 tablet by mouth daily.    . Nebivolol HCl 20 MG TABS Take 20 mg by mouth daily.     . Omega-3 Fatty Acids (FISH OIL) 600 MG CAPS Take 600 mg by mouth daily.     Marland Kitchen omeprazole (PRILOSEC) 20 MG capsule Take 20 mg by mouth daily.     . ranitidine (ZANTAC) 150 MG tablet Take 150 mg by mouth at bedtime.     . simvastatin (ZOCOR) 20 MG tablet Take 20 mg by mouth at bedtime.     . SYRINGE/NEEDLE, DISP, 1 ML (BD LUER-LOK SYRINGE) 25G X 5/8" 1 ML MISC Use as directed to inject  Acthar 10 each prn  . Tamsulosin HCl (FLOMAX) 0.4 MG CAPS Take 0.4 mg by mouth daily.    Marland Kitchen warfarin (COUMADIN) 5 MG tablet Take 1 tablet by mouth daily or as directed by coumadin clinic. 90 tablet 2  . ciprofloxacin (CIPRO) 250 MG tablet Take 1 tablet by mouth daily. Reported on 11/02/2015    . Lactulose 20 GM/30ML SOLN Take 7.5 mLs (5 g total) by mouth 2 (two) times daily. (Patient not taking: Reported on 11/02/2015) 900 mL 3   No current facility-administered medications for this visit.    REVIEW OF SYSTEMS:  [X]  denotes positive finding, [ ]  denotes negative finding Cardiac  Comments:  Chest pain or chest pressure:    Shortness of breath upon exertion: x   Short of breath when lying flat:    Irregular heart rhythm: x       Vascular    Pain in calf, thigh, or hip brought on by ambulation:  x   Pain in feet at night that wakes you up from your sleep:     Blood clot in your veins:  Leg swelling:  x       Pulmonary    Oxygen at home:    Productive cough:     Wheezing:         Neurologic    Sudden weakness in arms or legs:     Sudden numbness in arms or legs:     Sudden onset of difficulty speaking or slurred speech:    Temporary loss of vision in one eye:     Problems with dizziness:         Gastrointestinal    Blood in stool:     Vomited blood:         Genitourinary    Burning when urinating:  x   Blood in urine:        Psychiatric    Major depression:         Hematologic    Bleeding problems:    Problems with blood clotting too easily:        Skin    Rashes or ulcers:        Constitutional    Fever or chills:      PHYSICAL EXAM: Filed Vitals:   11/02/15 1451  BP: 124/86  Pulse: 81  Temp: 97.2 F (36.2 C)  TempSrc: Oral  Resp: 18  Height: 5\' 3"  (1.6 m)  Weight: 260 lb (117.935 kg)  SpO2: 100%    GENERAL: The patient is a well-nourished morbidly  female, in no acute distress. The vital signs are documented above. VASCULAR:Non-palpable popliteal and pedal pulses. Feet are well perfused.  PULMONARY:  nonlabored respiratory effort. MUSCULOSKELETAL: Bilateral leg swelling. No significant varicosities. No venous stasis changes. No ulcerations seen.  NEUROLOGIC: No focal weakness or paresthesias are detected. SKIN: There are no ulcers or rashes noted. PSYCHIATRIC: The patient has a normal affect.  DATA:  Venous reflux exam from 09/02/2015 reviewed.   LLE: There is a short segment of left great saphenous vein reflux. Diameters range from 5 mm at the knee to 7 mm at the saphenofemoral junction.   No evidence of DVT bilaterally.  MEDICAL ISSUES: Bilateral leg swelling   The patient has evidence of mild chronic venous insufficiency. Given her other medical comorbidities, do not believe that ablation of her left great saphenous vein will  significantly improve her symptoms. Discussed the use of compression stockings and elevation for symptomatic relief. The patient will follow up on an as-needed basis. He is to return sooner if her swelling worsens or if she develops skin breakdown.   Virgina Jock, PA-C [facility] [place]      I have examined the patient, reviewed and agree with above. Very complex patient. Can transfer from bed to chair only due to her MS. Has both superficial and deep venous reflux in her left leg. Bilateral leg swelling. This is mostly in her thighs and knee area. Morbidly obese. I reimaged her saphenous vein with SonoSite ultrasound. She does have moderate dilatation of this with several side branches along the course of her thigh and proximal knee. I feel that she would have very little improvement with ablation of her saphenous vein in light of her significant deep venous reflux. Also has a history of popliteal DVT. Recommend continued conservative treatment. She is comfortable with this recommendation and will notify should she develop worsening symptoms  Curt Jews, MD 11/02/2015 3:31 PM

## 2015-11-02 NOTE — Telephone Encounter (Signed)
Faxed RX for gabapentin to [facility]. Received a receipt of confirmation.

## 2015-11-03 ENCOUNTER — Other Ambulatory Visit: Payer: Self-pay

## 2015-11-03 DIAGNOSIS — G609 Hereditary and idiopathic neuropathy, unspecified: Secondary | ICD-10-CM

## 2015-11-03 MED ORDER — GABAPENTIN 300 MG PO CAPS: ORAL_CAPSULE | ORAL | Status: DC

## 2015-11-04 DIAGNOSIS — K592 Neurogenic bowel, not elsewhere classified: Secondary | ICD-10-CM | POA: Diagnosis not present

## 2015-11-04 DIAGNOSIS — I4891 Unspecified atrial fibrillation: Secondary | ICD-10-CM | POA: Diagnosis not present

## 2015-11-04 DIAGNOSIS — G35 Multiple sclerosis: Secondary | ICD-10-CM | POA: Diagnosis not present

## 2015-11-04 DIAGNOSIS — I10 Essential (primary) hypertension: Secondary | ICD-10-CM | POA: Diagnosis not present

## 2015-11-04 DIAGNOSIS — E039 Hypothyroidism, unspecified: Secondary | ICD-10-CM | POA: Diagnosis not present

## 2015-11-04 DIAGNOSIS — N319 Neuromuscular dysfunction of bladder, unspecified: Secondary | ICD-10-CM | POA: Diagnosis not present

## 2015-11-08 ENCOUNTER — Telehealth: Payer: Self-pay | Admitting: Neurology

## 2015-11-08 NOTE — Telephone Encounter (Signed)
I called and spoke to [place].  She did have new prescription from 11-03-15.  She will update. Gabapentin 300mg  po up to four times daily.

## 2015-11-08 NOTE — Telephone Encounter (Signed)
[facility] [id number] called to get clarification on Rx for gabapentin (NEURONTIN) 300 MG capsule, states Rx says 1 capsule by 3  X day, 4 times daily. Please call to clarify, Reference# [id number] when calling.

## 2015-11-11 DIAGNOSIS — N319 Neuromuscular dysfunction of bladder, unspecified: Secondary | ICD-10-CM | POA: Diagnosis not present

## 2015-11-11 DIAGNOSIS — G35 Multiple sclerosis: Secondary | ICD-10-CM | POA: Diagnosis not present

## 2015-11-11 DIAGNOSIS — I4891 Unspecified atrial fibrillation: Secondary | ICD-10-CM | POA: Diagnosis not present

## 2015-11-11 DIAGNOSIS — E039 Hypothyroidism, unspecified: Secondary | ICD-10-CM | POA: Diagnosis not present

## 2015-11-11 DIAGNOSIS — I10 Essential (primary) hypertension: Secondary | ICD-10-CM | POA: Diagnosis not present

## 2015-11-11 DIAGNOSIS — K592 Neurogenic bowel, not elsewhere classified: Secondary | ICD-10-CM | POA: Diagnosis not present

## 2015-11-15 ENCOUNTER — Ambulatory Visit (INDEPENDENT_AMBULATORY_CARE_PROVIDER_SITE_OTHER): Payer: Medicare Other | Admitting: Pharmacist Clinician (PhC)/ Clinical Pharmacy Specialist

## 2015-11-15 DIAGNOSIS — I4819 Other persistent atrial fibrillation: Secondary | ICD-10-CM

## 2015-11-15 DIAGNOSIS — I481 Persistent atrial fibrillation: Secondary | ICD-10-CM | POA: Diagnosis not present

## 2015-11-15 DIAGNOSIS — Z7901 Long term (current) use of anticoagulants: Secondary | ICD-10-CM | POA: Diagnosis not present

## 2015-11-15 LAB — POCT INR: INR: 2.1

## 2015-11-18 DIAGNOSIS — E039 Hypothyroidism, unspecified: Secondary | ICD-10-CM | POA: Diagnosis not present

## 2015-11-18 DIAGNOSIS — I10 Essential (primary) hypertension: Secondary | ICD-10-CM | POA: Diagnosis not present

## 2015-11-18 DIAGNOSIS — N319 Neuromuscular dysfunction of bladder, unspecified: Secondary | ICD-10-CM | POA: Diagnosis not present

## 2015-11-18 DIAGNOSIS — K592 Neurogenic bowel, not elsewhere classified: Secondary | ICD-10-CM | POA: Diagnosis not present

## 2015-11-18 DIAGNOSIS — I4891 Unspecified atrial fibrillation: Secondary | ICD-10-CM | POA: Diagnosis not present

## 2015-11-18 DIAGNOSIS — G35 Multiple sclerosis: Secondary | ICD-10-CM | POA: Diagnosis not present

## 2015-11-23 ENCOUNTER — Telehealth: Payer: Self-pay | Admitting: Neurology

## 2015-11-23 NOTE — Telephone Encounter (Signed)
I spoke to [place], PT at [facility] and gave a verbal order per Dr. Brett Fairy to recertify pt for 8-9 weeks at 1 time per week. Barnabas Lister verbalized understanding.

## 2015-11-23 NOTE — Telephone Encounter (Signed)
Toni [name] with [facility] is calling and states the patient is coming up on the end of her certification and needs to be re-certified for 8 to 9 weeks 1 time per week.  Please call.

## 2015-11-25 DIAGNOSIS — N319 Neuromuscular dysfunction of bladder, unspecified: Secondary | ICD-10-CM | POA: Diagnosis not present

## 2015-11-25 DIAGNOSIS — E039 Hypothyroidism, unspecified: Secondary | ICD-10-CM | POA: Diagnosis not present

## 2015-11-25 DIAGNOSIS — I10 Essential (primary) hypertension: Secondary | ICD-10-CM | POA: Diagnosis not present

## 2015-11-25 DIAGNOSIS — G35 Multiple sclerosis: Secondary | ICD-10-CM | POA: Diagnosis not present

## 2015-11-25 DIAGNOSIS — I4891 Unspecified atrial fibrillation: Secondary | ICD-10-CM | POA: Diagnosis not present

## 2015-11-25 DIAGNOSIS — K592 Neurogenic bowel, not elsewhere classified: Secondary | ICD-10-CM | POA: Diagnosis not present

## 2015-11-29 DIAGNOSIS — E784 Other hyperlipidemia: Secondary | ICD-10-CM | POA: Diagnosis not present

## 2015-11-29 DIAGNOSIS — F418 Other specified anxiety disorders: Secondary | ICD-10-CM | POA: Diagnosis not present

## 2015-11-29 DIAGNOSIS — G35 Multiple sclerosis: Secondary | ICD-10-CM | POA: Diagnosis not present

## 2015-11-29 DIAGNOSIS — K5900 Constipation, unspecified: Secondary | ICD-10-CM | POA: Diagnosis not present

## 2015-11-29 DIAGNOSIS — R8299 Other abnormal findings in urine: Secondary | ICD-10-CM | POA: Diagnosis not present

## 2015-11-29 DIAGNOSIS — I480 Paroxysmal atrial fibrillation: Secondary | ICD-10-CM | POA: Diagnosis not present

## 2015-11-29 DIAGNOSIS — R609 Edema, unspecified: Secondary | ICD-10-CM | POA: Diagnosis not present

## 2015-11-29 DIAGNOSIS — G4733 Obstructive sleep apnea (adult) (pediatric): Secondary | ICD-10-CM | POA: Diagnosis not present

## 2015-11-29 DIAGNOSIS — R7301 Impaired fasting glucose: Secondary | ICD-10-CM | POA: Diagnosis not present

## 2015-11-29 DIAGNOSIS — N390 Urinary tract infection, site not specified: Secondary | ICD-10-CM | POA: Diagnosis not present

## 2015-11-29 DIAGNOSIS — Z1389 Encounter for screening for other disorder: Secondary | ICD-10-CM | POA: Diagnosis not present

## 2015-11-29 DIAGNOSIS — L8991 Pressure ulcer of unspecified site, stage 1: Secondary | ICD-10-CM | POA: Diagnosis not present

## 2015-11-30 DIAGNOSIS — E039 Hypothyroidism, unspecified: Secondary | ICD-10-CM | POA: Diagnosis not present

## 2015-11-30 DIAGNOSIS — G35 Multiple sclerosis: Secondary | ICD-10-CM | POA: Diagnosis not present

## 2015-11-30 DIAGNOSIS — K592 Neurogenic bowel, not elsewhere classified: Secondary | ICD-10-CM | POA: Diagnosis not present

## 2015-11-30 DIAGNOSIS — N319 Neuromuscular dysfunction of bladder, unspecified: Secondary | ICD-10-CM | POA: Diagnosis not present

## 2015-11-30 DIAGNOSIS — I10 Essential (primary) hypertension: Secondary | ICD-10-CM | POA: Diagnosis not present

## 2015-11-30 DIAGNOSIS — I4891 Unspecified atrial fibrillation: Secondary | ICD-10-CM | POA: Diagnosis not present

## 2015-12-06 DIAGNOSIS — K592 Neurogenic bowel, not elsewhere classified: Secondary | ICD-10-CM | POA: Diagnosis not present

## 2015-12-06 DIAGNOSIS — I4891 Unspecified atrial fibrillation: Secondary | ICD-10-CM | POA: Diagnosis not present

## 2015-12-06 DIAGNOSIS — N319 Neuromuscular dysfunction of bladder, unspecified: Secondary | ICD-10-CM | POA: Diagnosis not present

## 2015-12-06 DIAGNOSIS — E039 Hypothyroidism, unspecified: Secondary | ICD-10-CM | POA: Diagnosis not present

## 2015-12-06 DIAGNOSIS — G35 Multiple sclerosis: Secondary | ICD-10-CM | POA: Diagnosis not present

## 2015-12-06 DIAGNOSIS — I10 Essential (primary) hypertension: Secondary | ICD-10-CM | POA: Diagnosis not present

## 2015-12-06 DIAGNOSIS — E785 Hyperlipidemia, unspecified: Secondary | ICD-10-CM | POA: Diagnosis not present

## 2015-12-06 DIAGNOSIS — Z7901 Long term (current) use of anticoagulants: Secondary | ICD-10-CM | POA: Diagnosis not present

## 2015-12-09 DIAGNOSIS — K592 Neurogenic bowel, not elsewhere classified: Secondary | ICD-10-CM | POA: Diagnosis not present

## 2015-12-09 DIAGNOSIS — N319 Neuromuscular dysfunction of bladder, unspecified: Secondary | ICD-10-CM | POA: Diagnosis not present

## 2015-12-09 DIAGNOSIS — G35 Multiple sclerosis: Secondary | ICD-10-CM | POA: Diagnosis not present

## 2015-12-09 DIAGNOSIS — I4891 Unspecified atrial fibrillation: Secondary | ICD-10-CM | POA: Diagnosis not present

## 2015-12-09 DIAGNOSIS — E039 Hypothyroidism, unspecified: Secondary | ICD-10-CM | POA: Diagnosis not present

## 2015-12-09 DIAGNOSIS — I10 Essential (primary) hypertension: Secondary | ICD-10-CM | POA: Diagnosis not present

## 2015-12-13 ENCOUNTER — Ambulatory Visit (INDEPENDENT_AMBULATORY_CARE_PROVIDER_SITE_OTHER): Payer: Medicare Other | Admitting: Pharmacist Clinician (PhC)/ Clinical Pharmacy Specialist

## 2015-12-13 DIAGNOSIS — Z7901 Long term (current) use of anticoagulants: Secondary | ICD-10-CM | POA: Diagnosis not present

## 2015-12-13 DIAGNOSIS — I481 Persistent atrial fibrillation: Secondary | ICD-10-CM | POA: Diagnosis not present

## 2015-12-13 DIAGNOSIS — I4819 Other persistent atrial fibrillation: Secondary | ICD-10-CM

## 2015-12-13 LAB — POCT INR: INR: 1.9

## 2015-12-16 DIAGNOSIS — I4891 Unspecified atrial fibrillation: Secondary | ICD-10-CM

## 2015-12-16 DIAGNOSIS — G35 Multiple sclerosis: Secondary | ICD-10-CM

## 2015-12-16 DIAGNOSIS — K592 Neurogenic bowel, not elsewhere classified: Secondary | ICD-10-CM | POA: Diagnosis not present

## 2015-12-16 DIAGNOSIS — E039 Hypothyroidism, unspecified: Secondary | ICD-10-CM

## 2015-12-16 DIAGNOSIS — N319 Neuromuscular dysfunction of bladder, unspecified: Secondary | ICD-10-CM | POA: Diagnosis not present

## 2015-12-16 DIAGNOSIS — I10 Essential (primary) hypertension: Secondary | ICD-10-CM

## 2015-12-23 DIAGNOSIS — N319 Neuromuscular dysfunction of bladder, unspecified: Secondary | ICD-10-CM | POA: Diagnosis not present

## 2015-12-23 DIAGNOSIS — E039 Hypothyroidism, unspecified: Secondary | ICD-10-CM | POA: Diagnosis not present

## 2015-12-23 DIAGNOSIS — I10 Essential (primary) hypertension: Secondary | ICD-10-CM | POA: Diagnosis not present

## 2015-12-23 DIAGNOSIS — I4891 Unspecified atrial fibrillation: Secondary | ICD-10-CM | POA: Diagnosis not present

## 2015-12-23 DIAGNOSIS — G35 Multiple sclerosis: Secondary | ICD-10-CM | POA: Diagnosis not present

## 2015-12-23 DIAGNOSIS — K592 Neurogenic bowel, not elsewhere classified: Secondary | ICD-10-CM | POA: Diagnosis not present

## 2015-12-28 DIAGNOSIS — L821 Other seborrheic keratosis: Secondary | ICD-10-CM | POA: Diagnosis not present

## 2015-12-28 DIAGNOSIS — L919 Hypertrophic disorder of the skin, unspecified: Secondary | ICD-10-CM | POA: Diagnosis not present

## 2015-12-28 DIAGNOSIS — M6748 Ganglion, other site: Secondary | ICD-10-CM | POA: Diagnosis not present

## 2015-12-28 DIAGNOSIS — D1801 Hemangioma of skin and subcutaneous tissue: Secondary | ICD-10-CM | POA: Diagnosis not present

## 2015-12-30 ENCOUNTER — Ambulatory Visit (INDEPENDENT_AMBULATORY_CARE_PROVIDER_SITE_OTHER): Payer: Medicare Other | Admitting: Pharmacist

## 2015-12-30 DIAGNOSIS — I4819 Other persistent atrial fibrillation: Secondary | ICD-10-CM

## 2015-12-30 DIAGNOSIS — Z7901 Long term (current) use of anticoagulants: Secondary | ICD-10-CM

## 2015-12-30 DIAGNOSIS — K592 Neurogenic bowel, not elsewhere classified: Secondary | ICD-10-CM | POA: Diagnosis not present

## 2015-12-30 DIAGNOSIS — I10 Essential (primary) hypertension: Secondary | ICD-10-CM | POA: Diagnosis not present

## 2015-12-30 DIAGNOSIS — I4891 Unspecified atrial fibrillation: Secondary | ICD-10-CM | POA: Diagnosis not present

## 2015-12-30 DIAGNOSIS — I481 Persistent atrial fibrillation: Secondary | ICD-10-CM

## 2015-12-30 DIAGNOSIS — N319 Neuromuscular dysfunction of bladder, unspecified: Secondary | ICD-10-CM | POA: Diagnosis not present

## 2015-12-30 DIAGNOSIS — E039 Hypothyroidism, unspecified: Secondary | ICD-10-CM | POA: Diagnosis not present

## 2015-12-30 DIAGNOSIS — G35 Multiple sclerosis: Secondary | ICD-10-CM | POA: Diagnosis not present

## 2015-12-30 LAB — POCT INR: INR: 2.7

## 2016-01-06 ENCOUNTER — Ambulatory Visit: Payer: Medicare Other | Admitting: Neurology

## 2016-01-06 DIAGNOSIS — I10 Essential (primary) hypertension: Secondary | ICD-10-CM | POA: Diagnosis not present

## 2016-01-06 DIAGNOSIS — N319 Neuromuscular dysfunction of bladder, unspecified: Secondary | ICD-10-CM | POA: Diagnosis not present

## 2016-01-06 DIAGNOSIS — K592 Neurogenic bowel, not elsewhere classified: Secondary | ICD-10-CM | POA: Diagnosis not present

## 2016-01-06 DIAGNOSIS — G35 Multiple sclerosis: Secondary | ICD-10-CM | POA: Diagnosis not present

## 2016-01-06 DIAGNOSIS — I4891 Unspecified atrial fibrillation: Secondary | ICD-10-CM | POA: Diagnosis not present

## 2016-01-06 DIAGNOSIS — E039 Hypothyroidism, unspecified: Secondary | ICD-10-CM | POA: Diagnosis not present

## 2016-01-18 ENCOUNTER — Encounter: Payer: Medicare Other | Admitting: Pharmacist Clinician (PhC)/ Clinical Pharmacy Specialist

## 2016-01-19 ENCOUNTER — Encounter: Payer: Self-pay | Admitting: Neurology

## 2016-01-19 ENCOUNTER — Emergency Department (HOSPITAL_COMMUNITY): Payer: Medicare Other

## 2016-01-19 ENCOUNTER — Encounter (HOSPITAL_COMMUNITY): Payer: Self-pay

## 2016-01-19 ENCOUNTER — Ambulatory Visit (INDEPENDENT_AMBULATORY_CARE_PROVIDER_SITE_OTHER): Payer: Medicare Other | Admitting: Neurology

## 2016-01-19 ENCOUNTER — Observation Stay (HOSPITAL_COMMUNITY)
Admission: EM | Admit: 2016-01-19 | Discharge: 2016-01-21 | Disposition: A | Discharge status: Home / Self Care | Payer: Medicare Other | Attending: Cardiology | Admitting: Cardiology

## 2016-01-19 VITALS — BP 130/60 | HR 68 | Resp 20

## 2016-01-19 DIAGNOSIS — I480 Paroxysmal atrial fibrillation: Secondary | ICD-10-CM | POA: Diagnosis not present

## 2016-01-19 DIAGNOSIS — R0789 Other chest pain: Secondary | ICD-10-CM

## 2016-01-19 DIAGNOSIS — E039 Hypothyroidism, unspecified: Secondary | ICD-10-CM | POA: Diagnosis not present

## 2016-01-19 DIAGNOSIS — I5040 Unspecified combined systolic (congestive) and diastolic (congestive) heart failure: Secondary | ICD-10-CM | POA: Diagnosis not present

## 2016-01-19 DIAGNOSIS — G43909 Migraine, unspecified, not intractable, without status migrainosus: Secondary | ICD-10-CM | POA: Insufficient documentation

## 2016-01-19 DIAGNOSIS — G35 Multiple sclerosis: Secondary | ICD-10-CM | POA: Diagnosis present

## 2016-01-19 DIAGNOSIS — I4891 Unspecified atrial fibrillation: Secondary | ICD-10-CM | POA: Diagnosis not present

## 2016-01-19 DIAGNOSIS — K219 Gastro-esophageal reflux disease without esophagitis: Secondary | ICD-10-CM | POA: Diagnosis not present

## 2016-01-19 DIAGNOSIS — J45909 Unspecified asthma, uncomplicated: Secondary | ICD-10-CM | POA: Diagnosis not present

## 2016-01-19 DIAGNOSIS — Z79899 Other long term (current) drug therapy: Secondary | ICD-10-CM | POA: Diagnosis not present

## 2016-01-19 DIAGNOSIS — G4733 Obstructive sleep apnea (adult) (pediatric): Secondary | ICD-10-CM | POA: Insufficient documentation

## 2016-01-19 DIAGNOSIS — I482 Chronic atrial fibrillation, unspecified: Secondary | ICD-10-CM

## 2016-01-19 DIAGNOSIS — I10 Essential (primary) hypertension: Secondary | ICD-10-CM | POA: Diagnosis not present

## 2016-01-19 DIAGNOSIS — Z9989 Dependence on other enabling machines and devices: Principal | ICD-10-CM

## 2016-01-19 DIAGNOSIS — M1990 Unspecified osteoarthritis, unspecified site: Secondary | ICD-10-CM | POA: Insufficient documentation

## 2016-01-19 DIAGNOSIS — F329 Major depressive disorder, single episode, unspecified: Secondary | ICD-10-CM | POA: Insufficient documentation

## 2016-01-19 DIAGNOSIS — R079 Chest pain, unspecified: Secondary | ICD-10-CM | POA: Diagnosis not present

## 2016-01-19 DIAGNOSIS — R609 Edema, unspecified: Secondary | ICD-10-CM | POA: Diagnosis not present

## 2016-01-19 DIAGNOSIS — Z7901 Long term (current) use of anticoagulants: Secondary | ICD-10-CM | POA: Diagnosis not present

## 2016-01-19 DIAGNOSIS — E785 Hyperlipidemia, unspecified: Secondary | ICD-10-CM | POA: Insufficient documentation

## 2016-01-19 DIAGNOSIS — E079 Disorder of thyroid, unspecified: Secondary | ICD-10-CM | POA: Diagnosis not present

## 2016-01-19 DIAGNOSIS — F419 Anxiety disorder, unspecified: Secondary | ICD-10-CM | POA: Insufficient documentation

## 2016-01-19 DIAGNOSIS — Z87891 Personal history of nicotine dependence: Secondary | ICD-10-CM | POA: Insufficient documentation

## 2016-01-19 DIAGNOSIS — G8929 Other chronic pain: Secondary | ICD-10-CM | POA: Insufficient documentation

## 2016-01-19 DIAGNOSIS — I4819 Other persistent atrial fibrillation: Secondary | ICD-10-CM | POA: Diagnosis present

## 2016-01-19 DIAGNOSIS — I83893 Varicose veins of bilateral lower extremities with other complications: Secondary | ICD-10-CM | POA: Diagnosis present

## 2016-01-19 HISTORY — DX: Unspecified osteoarthritis, unspecified site: M19.90

## 2016-01-19 HISTORY — DX: Dependence on other enabling machines and devices: Z99.89

## 2016-01-19 HISTORY — DX: Obstructive sleep apnea (adult) (pediatric): G47.33

## 2016-01-19 HISTORY — DX: Hypothyroidism, unspecified: E03.9

## 2016-01-19 HISTORY — DX: Unspecified asthma, uncomplicated: J45.909

## 2016-01-19 HISTORY — DX: Migraine, unspecified, not intractable, without status migrainosus: G43.909

## 2016-01-19 HISTORY — DX: Gastro-esophageal reflux disease without esophagitis: K21.9

## 2016-01-19 HISTORY — DX: Unspecified atrial fibrillation: I48.91

## 2016-01-19 LAB — BASIC METABOLIC PANEL
Anion gap: 7 (ref 5–15)
BUN: 22 mg/dL — AB (ref 6–20)
CALCIUM: 9.5 mg/dL (ref 8.9–10.3)
CO2: 32 mmol/L (ref 22–32)
CREATININE: 0.91 mg/dL (ref 0.44–1.00)
Chloride: 100 mmol/L — ABNORMAL LOW (ref 101–111)
GFR calc Af Amer: >60 mL/min (ref >60)
GFR calc non Af Amer: >60 mL/min (ref >60)
GLUCOSE: 90 mg/dL (ref 65–99)
Potassium: 4.5 mmol/L (ref 3.5–5.1)
Sodium: 139 mmol/L (ref 135–145)

## 2016-01-19 LAB — CBC WITH DIFFERENTIAL/PLATELET
Basophils Absolute: 0 10*3/uL (ref 0.0–0.1)
Basophils Relative: 0 %
EOS ABS: 0.2 10*3/uL (ref 0.0–0.7)
EOS PCT: 3 %
HCT: 47.8 % — ABNORMAL HIGH (ref 36.0–46.0)
HEMOGLOBIN: 15.7 g/dL — AB (ref 12.0–15.0)
LYMPHS ABS: 2.9 10*3/uL (ref 0.7–4.0)
Lymphocytes Relative: 35 %
MCH: 30.1 pg (ref 26.0–34.0)
MCHC: 32.8 g/dL (ref 30.0–36.0)
MCV: 91.7 fL (ref 78.0–100.0)
MONO ABS: 1 10*3/uL (ref 0.1–1.0)
MONOS PCT: 12 %
Neutro Abs: 4.1 10*3/uL (ref 1.7–7.7)
Neutrophils Relative %: 50 %
PLATELETS: 179 10*3/uL (ref 150–400)
RBC: 5.21 MIL/uL — ABNORMAL HIGH (ref 3.87–5.11)
RDW: 13.2 % (ref 11.5–15.5)
WBC: 8.2 10*3/uL (ref 4.0–10.5)

## 2016-01-19 LAB — I-STAT TROPONIN, ED
TROPONIN I, POC: 0 ng/mL (ref 0.00–0.08)
Troponin i, poc: 0 ng/mL (ref 0.00–0.08)

## 2016-01-19 LAB — BRAIN NATRIURETIC PEPTIDE: B NATRIURETIC PEPTIDE 5: 119.3 pg/mL — AB (ref 0.0–100.0)

## 2016-01-19 LAB — GLUCOSE, CAPILLARY: GLUCOSE-CAPILLARY: 107 mg/dL — AB (ref 65–99)

## 2016-01-19 LAB — PROTIME-INR
INR: 2.12 — ABNORMAL HIGH (ref 0.00–1.49)
Prothrombin Time: 23.6 seconds — ABNORMAL HIGH (ref 11.6–15.2)

## 2016-01-19 MED ORDER — DILTIAZEM HCL 30 MG PO TABS
30.0000 mg | ORAL_TABLET | Freq: Four times a day (QID) | ORAL | Status: DC
  Administered 2016-01-19 – 2016-01-20 (×3): 30 mg via ORAL
  Filled 2016-01-19 (×3): qty 1

## 2016-01-19 MED ORDER — LEVOTHYROXINE SODIUM 75 MCG PO TABS
75.0000 ug | ORAL_TABLET | Freq: Every day | ORAL | Status: DC
  Administered 2016-01-20 – 2016-01-21 (×2): 75 ug via ORAL
  Filled 2016-01-19 (×2): qty 1

## 2016-01-19 MED ORDER — ACETAMINOPHEN 325 MG PO TABS
650.0000 mg | ORAL_TABLET | ORAL | Status: DC | PRN
Start: 2016-01-19 — End: 2016-01-21

## 2016-01-19 MED ORDER — NEBIVOLOL HCL 10 MG PO TABS
20.0000 mg | ORAL_TABLET | Freq: Every day | ORAL | Status: DC
  Administered 2016-01-20: 20 mg via ORAL
  Filled 2016-01-19: qty 2
  Filled 2016-01-19 (×2): qty 4
  Filled 2016-01-19: qty 2

## 2016-01-19 MED ORDER — CORTICOTROPIN 80 UNIT/ML IJ GEL: 80.0000 [IU] | INTRAMUSCULAR | Status: DC

## 2016-01-19 MED ORDER — SIMVASTATIN 20 MG PO TABS
20.0000 mg | ORAL_TABLET | Freq: Every day | ORAL | Status: DC
  Administered 2016-01-19: 20 mg via ORAL
  Filled 2016-01-19: qty 2

## 2016-01-19 MED ORDER — PANTOPRAZOLE SODIUM 40 MG PO TBEC
40.0000 mg | DELAYED_RELEASE_TABLET | Freq: Every day | ORAL | Status: DC
Start: 2016-01-20 — End: 2016-01-21
  Administered 2016-01-20 – 2016-01-21 (×2): 40 mg via ORAL
  Filled 2016-01-19 (×2): qty 1

## 2016-01-19 MED ORDER — BUPROPION HCL ER (XL) 150 MG PO TB24
300.0000 mg | ORAL_TABLET | Freq: Every day | ORAL | Status: DC
  Administered 2016-01-20 – 2016-01-21 (×2): 300 mg via ORAL
  Filled 2016-01-19 (×2): qty 2

## 2016-01-19 MED ORDER — DILTIAZEM HCL 25 MG/5ML IV SOLN
10.0000 mg | Freq: Once | INTRAVENOUS | Status: AC
  Administered 2016-01-19: 10 mg via INTRAVENOUS
  Filled 2016-01-19: qty 5

## 2016-01-19 MED ORDER — GABAPENTIN 300 MG PO CAPS
300.0000 mg | ORAL_CAPSULE | Freq: Once | ORAL | Status: AC
  Administered 2016-01-19: 300 mg via ORAL
  Filled 2016-01-19: qty 1

## 2016-01-19 MED ORDER — ONDANSETRON HCL 4 MG/2ML IJ SOLN: 4.0000 mg | Freq: Four times a day (QID) | INTRAMUSCULAR | Status: DC | PRN

## 2016-01-19 MED ORDER — GABAPENTIN 300 MG PO CAPS
300.0000 mg | ORAL_CAPSULE | Freq: Three times a day (TID) | ORAL | Status: DC
  Administered 2016-01-20 – 2016-01-21 (×4): 300 mg via ORAL
  Filled 2016-01-19 (×5): qty 1

## 2016-01-19 MED ORDER — BACLOFEN 20 MG PO TABS
20.0000 mg | ORAL_TABLET | Freq: Three times a day (TID) | ORAL | Status: DC | PRN
  Filled 2016-01-19: qty 1

## 2016-01-19 MED ORDER — WARFARIN SODIUM 5 MG PO TABS
5.0000 mg | ORAL_TABLET | Freq: Every day | ORAL | Status: DC
  Administered 2016-01-19 – 2016-01-20 (×2): 5 mg via ORAL
  Filled 2016-01-19 (×2): qty 1

## 2016-01-19 MED ORDER — WARFARIN - PHYSICIAN DOSING INPATIENT: Freq: Every day | Status: DC

## 2016-01-19 MED ORDER — TAMSULOSIN HCL 0.4 MG PO CAPS
0.4000 mg | ORAL_CAPSULE | Freq: Every day | ORAL | Status: DC
  Administered 2016-01-20 – 2016-01-21 (×2): 0.4 mg via ORAL
  Filled 2016-01-19 (×2): qty 1

## 2016-01-19 MED ORDER — FUROSEMIDE 40 MG PO TABS
60.0000 mg | ORAL_TABLET | Freq: Every day | ORAL | Status: DC
  Administered 2016-01-20: 60 mg via ORAL
  Filled 2016-01-19 (×2): qty 1

## 2016-01-19 MED ORDER — ASPIRIN 81 MG PO CHEW
324.0000 mg | CHEWABLE_TABLET | Freq: Once | ORAL | Status: AC
Start: 2016-01-19 — End: 2016-01-19
  Administered 2016-01-19: 324 mg via ORAL
  Filled 2016-01-19: qty 4

## 2016-01-19 NOTE — ED Provider Notes (Signed)
CSN: [id number]     Arrival date & time 01/19/16  1022 History   First MD Initiated Contact with Patient 01/19/16 1049     No chief complaint on file.    (Consider location/radiation/quality/duration/timing/severity/associated sxs/prior Treatment) HPI Comments: 70yo F w/ extensive PMH including a fib on coumadin, MS, OSA who p/w chest pain and L arm pain. Yesterday evening when the patient was laying in bed, she had a sudden onset of left arm pain which she states began in her hands and then moved up her arm into her left chest. The pain is intermittent, not associated with exertion, and lasts for a few seconds at a time. She denies any chest pain currently. She denies any associated shortness of breath, nausea, vomiting, or diaphoresis. She was at a routine of neurology follow-up appointment this morning and was sent here because of report of chest pain. She denies noticing any heart racing sensation as she states she thinks her heart intermittently beats fast all the time. She has taken all of her medications as prescribed, although she Occasionally delays her Lasix dose if she is going out to run errands. No dysuria. No change in leg swelling.  The history is provided by the patient.    Past Medical History  Diagnosis Date  . Multiple sclerosis ([place])     has had this may 1989-[facility] reg doc  . Hypertension   . PAF (paroxysmal atrial fibrillation) ([place])     on coumadin; documented on monitor 06/2010  . Thyroid disease   . HLD (hyperlipidemia)   . Shortness of breath   . Pneumonia   . Hyperthyroidism   . Edema     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' ablation of R GSV 2012  . Depression   . Anxiety   . Headache(784.0)     MIGRAINE   . Chronic pain   . Hx of echocardiogram 04/22/10    EF 55-60%, no valve issues  . History of stress test 06/21/10    limited exam with some degree of breast attenuatin of the apex however a component of apical ischemia is not  excluded, EF 62%; cardiac cath   . Sleep apnea     cpap  . Abnormality of gait 03/01/2015  . DVT (deep venous thrombosis) ([place])     1983   Past Surgical History  Procedure Laterality Date  . Cervical fusion      with correction-june 2005  . Infusion pump implantation      baclofen infusion in lower abd  . Venous ablation  12/16/10    radiofreq ablation -Dr Elisabeth Cara and [facility]  . Child births      X29  . Cardiac catheterization  06/22/10    normal coronary arteries, PAF  . Tonsillectomy    . Pain pump revision N/A 07/29/2014    Procedure: Baclofen pump replacement;  Surgeon: Erline Levine, MD;  Location: [place] [facility];  Service: Neurosurgery;  Laterality: N/A;  Baclofen pump replacement   Family History  Problem Relation Age of Onset  . Coronary artery disease Father     at age 75  . Coronary artery disease Maternal Grandmother   . Depression Maternal Grandmother   . Cancer Paternal Grandmother   . Depression Mother   . Alcoholism Son    Social History  Substance Use Topics  . Smoking status: Former Smoker    Quit date: 08/21/1976  . Smokeless tobacco: Never Used     Comment:  QUIT 1978  . Alcohol Use: Yes     Comment: "Maybe a glass of wine everyone in a while"   OB History    No data available     Review of Systems 10 Systems reviewed and are negative for acute change except as noted in the HPI.    Allergies  Oxycodone; Chlorhexidine gluconate; and Zoloft  Home Medications   Prior to Admission medications   Medication Sig Start Date End Date Taking? Authorizing Provider  ALPRAZolam Duanne Moron) 0.5 MG tablet Take 1 tablet (0.5 mg total) by mouth 2 (two) times daily as needed for anxiety. 03/30/15   [name], MD  baclofen (LIORESAL) 20 MG tablet Take 1 tablet (20 mg total) by mouth 3 (three) times daily as needed for muscle spasms. 03/30/15   Asencion Partridge Dohmeier, MD  buPROPion (WELLBUTRIN XL) 300 MG 24 hr tablet Take 300 mg by mouth daily.    Historical Provider, MD   cetirizine (ZYRTEC) 10 MG tablet Take 10 mg by mouth daily.    Historical Provider, MD  Cholecalciferol (VITAMIN D) 2000 units tablet Take 2,000 Units by mouth 2 (two) times daily.    Historical Provider, MD  co-enzyme Q-10 30 MG capsule Take 30 mg by mouth daily.    Historical Provider, MD  corticotropin (ATHACAR H.P.) 80 UNIT/ML injectable gel Inject 1 mL (80 Units total) into the skin every other day. To achieve control of relapsing MS Symptoms. (total of 5 injections per month) 10/04/15   Asencion Partridge Dohmeier, MD  fluticasone Asencion Islam) 50 MCG/ACT nasal spray Use as needed for allergies 03/23/15   Historical Provider, MD  furosemide (LASIX) 40 MG tablet Take 1.5 tablets (60 mg total) by mouth daily. Take 60 mg by mouth daily 08/31/15   Pixie Casino, MD  gabapentin (NEURONTIN) 300 MG capsule Take 1 capsule by mouth up to four times daily. 11/03/15   Asencion Partridge Dohmeier, MD  lactulose, encephalopathy, (CHRONULAC) 10 GM/15ML SOLN Take 1 to 2 tablespoonfuls  daily as directed 10/28/15   [name], MD  levothyroxine (SYNTHROID, LEVOTHROID) 75 MCG tablet Take 75 mcg by mouth daily.    Historical Provider, MD  Multiple Minerals-Vitamins (CALCIUM & VIT D3 BONE HEALTH PO) Take by mouth 2 (two) times daily. 1000/500mg     Historical Provider, MD  Multiple Vitamins-Minerals (MULTIVITAMIN ADULT PO) Take 1 tablet by mouth daily.    Historical Provider, MD  Nebivolol HCl 20 MG TABS Take 20 mg by mouth daily.     Historical Provider, MD  Omega-3 Fatty Acids (FISH OIL) 600 MG CAPS Take 600 mg by mouth daily.     Historical Provider, MD  omeprazole (PRILOSEC) 20 MG capsule Take 20 mg by mouth daily.     Historical Provider, MD  ranitidine (ZANTAC) 150 MG tablet Take 150 mg by mouth at bedtime.  12/22/12   Historical Provider, MD  simvastatin (ZOCOR) 20 MG tablet Take 20 mg by mouth at bedtime.     Historical Provider, MD  SYRINGE/NEEDLE, DISP, 1 ML (BD LUER-LOK SYRINGE) 25G X 5/8" 1 ML MISC Use as directed to inject   [place] 06/15/15   [name], MD  Tamsulosin HCl (FLOMAX) 0.4 MG CAPS Take 0.4 mg by mouth daily.    Historical Provider, MD  warfarin (COUMADIN) 5 MG tablet Take 1 tablet by mouth daily or as directed by coumadin clinic. 05/10/15   Pixie Casino, MD   BP 132/105 mmHg  Pulse 60  Temp(Src) 97.9 F (36.6 C) (Oral)  Resp 16  Ht 5\' 3"  (1.6 m)  Wt 260 lb (117.935 kg)  BMI 46.07 kg/m2  SpO2 98% Physical Exam  Constitutional: She is oriented to person, place, and time. She appears well-developed and well-nourished. No distress.  HENT:  Head: Normocephalic and atraumatic.  Moist mucous membranes  Eyes: Conjunctivae are normal. Pupils are equal, round, and reactive to light.  Neck: Neck supple.  Cardiovascular: Normal heart sounds.  An irregularly irregular rhythm present. Tachycardia present.   No murmur heard. Pulmonary/Chest: Effort normal and breath sounds normal.  Abdominal: Soft. Bowel sounds are normal. She exhibits no distension. There is no tenderness.  Musculoskeletal: She exhibits edema (mild BLE edema).  Neurological: She is alert and oriented to person, place, and time.  Fluent speech  Skin: Skin is warm and dry.  Psychiatric: She has a normal mood and affect. Judgment normal.  Nursing note and vitals reviewed.   ED Course  Procedures (including critical care time) Labs Review Labs Reviewed  BASIC METABOLIC PANEL - Abnormal; Notable for the following:    Chloride 100 (*)    BUN 22 (*)    All other components within normal limits  CBC WITH DIFFERENTIAL/PLATELET - Abnormal; Notable for the following:    RBC 5.21 (*)    Hemoglobin 15.7 (*)    HCT 47.8 (*)    All other components within normal limits  PROTIME-INR - Abnormal; Notable for the following:    Prothrombin Time 23.6 (*)    INR 2.12 (*)    All other components within normal limits  BRAIN NATRIURETIC PEPTIDE - Abnormal; Notable for the following:    B Natriuretic Peptide 119.3 (*)    All other  components within normal limits  I-STAT TROPOININ, ED  Randolm Idol, ED    Imaging Review Dg Chest 2 View  01/19/2016  CLINICAL DATA:  Left chest pain and arm pain starting last night. EXAM: CHEST  2 VIEW COMPARISON:  10/30/2013 FINDINGS: Mild enlargement of the cardiopericardial silhouette. No at edema identified. Thoracic spondylosis. Lower cervical plate and screw fixator. Degenerative spurring at both [facility] joints. No pleural effusion.  The lungs appear clear. IMPRESSION: 1. Mild enlargement of the cardiopericardial silhouette, without edema. 2. Thoracic spondylosis. Electronically Signed   By: Van Clines M.D.   On: 01/19/2016 12:15   I have personally reviewed and evaluated these lab results as part of my medical decision-making.   EKG Interpretation   Date/Time:  Wednesday Jan 19 2016 10:26:23 EDT Ventricular Rate:  137 PR Interval:    QRS Duration: 86 QT Interval:  316 QTC Calculation: 477 R Axis:   119 Text Interpretation:  Atrial fibrillation with rapid ventricular response  Right axis deviation Cannot rule out Anterior infarct , age undetermined  Abnormal ECG A fib with RVR new from previous ST depression in lateral  leads may be rate related Confirmed by Lizvet Chunn MD, [place] [phone]) on  01/19/2016 10:38:35 AM      MDM   Final diagnoses:  Chest pain, unspecified chest pain type  Atrial fibrillation with RVR ([facility])   Pt p/w intermittent episodes of left arm pain radiating into her chest, since from neurology clinic this morning. On exam she was awake and alert, comfortable and denying any chest pain. EKG showed A. fib with RVR. Vital signs notable for heart rate variable, ranging from 80s to 120. Normal work of breathing and no crackles on exam. Obtained above lab work including troponin and BNP as well as INR. Gave 325mg  ASA. For patient's heart  rate, gave the patient 10 mg IV diltiazem which improved her rate.  Chest x-ray shows mild enlargement of  cardiopericardial silhouette but otherwise no acute changes. INR 2.12, serial troponins negative. BNP 119. Because of patient's comorbidities and variable rate, contacted cardiology for evaluation. They will admit the patient for further workup of her symptoms and rate control.   Sharlett Iles, MD 01/19/16 [phone]

## 2016-01-19 NOTE — ED Notes (Signed)
Pt presents with onset of L arm pain that began last night.  Pt reports pain radiates into L axilla and into chest.  -shortness of breath.

## 2016-01-19 NOTE — ED Notes (Signed)
Attempted report 

## 2016-01-19 NOTE — Progress Notes (Signed)
Provider:  [name], M D  Referring Provider: Prince Solian, MD Primary Care Physician:  [name], MD  Chief Complaint  Patient presents with  . Follow-up    things going "not the greatest", cpap, MS    HPI:  Toni [name] is a 70 y.o. female seen here as a revisit for MS follow up.   The patient has had her baclofen pump replaced in December 2015 and March 2016 . The pump refill went well.  Before the pump was replaced, she had noted a higher and higher need for oral baclofen supplement. She uses ACTH to avoid the previously steroid insuced a fib , she has less weight gain and less HTN on the ACTH. Her right leg is weaker than last year, but she has not fallen. She is her for a routine visit , has no acute questions.  Her extensive history is re viewed in previous noted.   01-25-15 Is until is here for routine visit today and she brought me a beautiful Gardenia blossom. Her sleepiness score is endorsed at 8 points fatigue severity 34, we have today also a download from her [employer] auto system.   Unfortunately the data very concerning. By the patient is compliant with 4 hours and 25 minutes of daily use and 96.7% compliance 29 out of 30 days. She has had a mean pressure of 12 cm water peak pressure of 14 cm water but an average AHI of 37.9.  reports that her nose is congested and often doesn't allow unhindered airflow and that she sometimes has to take the mask off for that reason.  However this is in an extraordinary high AHI. Her machine doesn't differentiate between central and obstructives.  She reports using nasal spray and allergy medication to prevent rhinitis.  She will need to see ENT for this remaining nasal obstruction.  I will order the machine to be autotration set 5-15 cm water .  Continues with nasal pillows, chin strap for oral air leakage.      OSA : Now with [facility], where she was given an AIRFIT F 10- a FFM. She had a lot of air leaking by  using a nasal pillow, but because of her dexterity problems in relation to MS it is much harder for her to use a full face mask with multiple straps buttons and release clips. I would like for her to return to whatever mask she can manipulate by herself the easiest, this would not be a dream wear , nor a FFM .  We will ignore the air leaks! Has a home pulseoximetry at home, she has sufficient 02 levels.  Neurogenic bladder :She still wakes up at night.-She wakes up every 2 hours with urinary bladder spasms. The patient was discussed prescribed hyoscyamine by her urologist, Dr. Gaynelle Arabian, but then the medication was recalled. She did not feel however for the short time that she used it but it gave her much benefit. She also has samples of scopolamine now at home she reports. She was also offered bladder spasm treatments with Botox but since she has to cath herself in order to undergo that treatment she is worried that her current dexterity would not allow for sufficient self-catheterization.  MS; Toni [name]'s fine motor skills and ambulatory skills have significantly declined over the last 5 years - she is now mostly seen here in a wheelchair, but she can transfer. She continues to use the baclofen pump which is followed by Dr.  Willis. Cognitive decline was a concern but 3 sequenced  Montral cognitive assessment tests showed 30 and 29 out of 30 points and word fluency of 13 words.  She also endorsed the Epworth sleepiness score at 11 points and the fatigue severity score was not endorsed. Odema: severe ankle edema and veinous insufficiency. On diuretics. Not using compression stockings.  She has anasarca when she wakes first in the morning. She has low albumin. She takes a protein rich diet. She is losing muscle mass.  Osteopenia. Arthritis. T once a week for mobility maintenance. I will renew her prescription.   Interval history from the 31-05- 2017. My long-standing patient [name] is here  today for a revisit. This included a Montral cognitive assessment test which she aced with 29 out of 30 points. She endorsed the geriatric depression score at 5 points the fatigue severity score at 24 points, and the Epworth score at 14 points.  She reports that she has a feeling her CPAP does not pick up flow when she holds her breath something that her old machine would have done. She was given this replacement by [facility] was originally set up with [facility]. Her new machine however seems not to give her the airflow she craves. In interrogation into the machine today was not possible as the memory chip was supposedly "corrupted".  Mrs. Kolasinski fields more unsteady when she transfers now, it feels a little bit like vertigo to her without being a classic vertigo. She also has continued to feel burning in her left hand especially. She reports that she feels she has no  "Oomph". Her atrial fibrillation has become chronic and is no longer paroxysmal, this may also take some of her energy away. Her ejection fraction may be significantly impaired. She continues to have ankle edema. 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 Baclofen he still helps with the spasticity but also can leave her probably, gabapentin 4 tablets a day one of the morning one at lunch 2 at night helped with a burning sensation of the hand but has not alleviated his completely. Overall her download on his CPAP also shows a very high residual AHI of 38. I am concerned that this is an artifact related to high air leaks. Her average device pressure is 13.6 cm water. She does not feel that she has pots and her breathing and her husband has not reported them to her so it may be the air leaks erroneously leading to this high AHI. I will [facility] to provide her with a dream ware mask that she would like to try she's using a chinstrap we have to find a tight to her headgear for her. I will not change the  settings until I have seen is a new mask reduces the leakage.   Montreal Cognitive Assessment  10/04/2015 06/02/2015 03/30/2015  Visuospatial/ Executive (0/5) 4 5 5   Naming (0/3) 3 3 3   Attention: Read list of digits (0/2) 2 2 2   Attention: Read list of letters (0/1) 1 1 1   Attention: Serial 7 subtraction starting at 100 (0/3) 3 3 3   Language: Repeat phrase (0/2) 2 2 2   Language : Fluency (0/1) 1 1 1   Abstraction (0/2) 2 2 2   Delayed Recall (0/5) 5 5 5   Orientation (0/6) 6 6 6   Total 29 30 30   Adjusted Score (based on education) 29 30 30         Review of  Systems: Out of a complete 14 system review, the patient complains of only the following symptoms, and all other reviewed systems are negative. Wheelchair bound.  Now back at home after rehab with PT> carpal tunnel.  Epworth 8 and FSS 34.  Social History   Social History  . Marital Status: Married    Spouse Name: N/A  . Number of Children: 2  . Years of Education: COLLEGE   Occupational History  . RETIRED    Social History Main Topics  . Smoking status: Former Smoker    Quit date: 08/21/1976  . Smokeless tobacco: Never Used     Comment: QUIT 1978  . Alcohol Use: Yes     Comment: "Maybe a glass of wine everyone in a while"  . Drug Use: No  . Sexual Activity: Not on file   Other Topics Concern  . Not on file   Social History Narrative   Lives in [place] with Tillman Sers [id number], [phone]          Family History  Problem Relation Age of Onset  . Coronary artery disease Father     at age 57  . Coronary artery disease Maternal Grandmother   . Depression Maternal Grandmother   . Cancer Paternal Grandmother   . Depression Mother   . Alcoholism Son     Past Medical History  Diagnosis Date  . Multiple sclerosis ([place])     has had this may 1989-[facility] reg doc  . Hypertension   . PAF (paroxysmal atrial fibrillation) ([place])     on coumadin; documented on monitor 06/2010  . Thyroid disease   . HLD  (hyperlipidemia)   . Shortness of breath   . Pneumonia   . Hyperthyroidism   . Edema     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' ablation of R GSV 2012  . Depression   . Anxiety   . Headache(784.0)     MIGRAINE   . Chronic pain   . Hx of echocardiogram 04/22/10    EF 55-60%, no valve issues  . History of stress test 06/21/10    limited exam with some degree of breast attenuatin of the apex however a component of apical ischemia is not excluded, EF 62%; cardiac cath   . Sleep apnea     cpap  . Abnormality of gait 03/01/2015  . DVT (deep venous thrombosis) ([place])     1983    Current Outpatient Prescriptions  Medication Sig Dispense Refill  . ALPRAZolam (XANAX) 0.5 MG tablet Take 1 tablet (0.5 mg total) by mouth 2 (two) times daily as needed for anxiety. 180 tablet 1  . baclofen (LIORESAL) 20 MG tablet Take 1 tablet (20 mg total) by mouth 3 (three) times daily as needed for muscle spasms. 180 each 3  . buPROPion (WELLBUTRIN XL) 300 MG 24 hr tablet Take 300 mg by mouth daily.    . cetirizine (ZYRTEC) 10 MG tablet Take 10 mg by mouth daily.    . Cholecalciferol (VITAMIN D) 2000 units tablet Take 2,000 Units by mouth 2 (two) times daily.    Marland Kitchen co-enzyme Q-10 30 MG capsule Take 30 mg by mouth daily.    . corticotropin (ATHACAR H.P.) 80 UNIT/ML injectable gel Inject 1 mL (80 Units total) into the skin every other day. To achieve control of relapsing MS Symptoms. (total of 5 injections per month) 5 mL 6  . fluticasone (FLONASE) 50 MCG/ACT nasal spray Use as needed for allergies    .  furosemide (LASIX) 40 MG tablet Take 1.5 tablets (60 mg total) by mouth daily. Take 60 mg by mouth daily 135 tablet 3  . gabapentin (NEURONTIN) 300 MG capsule Take 1 capsule by mouth up to four times daily. 360 capsule 3  . lactulose, encephalopathy, (CHRONULAC) 10 GM/15ML SOLN Take 1 to 2 tablespoonfuls  daily as directed 946 mL o  . levothyroxine (SYNTHROID, LEVOTHROID) 75 MCG tablet Take 75 mcg by  mouth daily.    . Multiple Minerals-Vitamins (CALCIUM & VIT D3 BONE HEALTH PO) Take by mouth 2 (two) times daily. 1000/500mg     . Multiple Vitamins-Minerals (MULTIVITAMIN ADULT PO) Take 1 tablet by mouth daily.    . Nebivolol HCl 20 MG TABS Take 20 mg by mouth daily.     . Omega-3 Fatty Acids (FISH OIL) 600 MG CAPS Take 600 mg by mouth daily.     Marland Kitchen omeprazole (PRILOSEC) 20 MG capsule Take 20 mg by mouth daily.     . ranitidine (ZANTAC) 150 MG tablet Take 150 mg by mouth at bedtime.     . simvastatin (ZOCOR) 20 MG tablet Take 20 mg by mouth at bedtime.     . SYRINGE/NEEDLE, DISP, 1 ML (BD LUER-LOK SYRINGE) 25G X 5/8" 1 ML MISC Use as directed to inject  Acthar 10 each prn  . Tamsulosin HCl (FLOMAX) 0.4 MG CAPS Take 0.4 mg by mouth daily.    Marland Kitchen warfarin (COUMADIN) 5 MG tablet Take 1 tablet by mouth daily or as directed by coumadin clinic. 90 tablet 2   No current facility-administered medications for this visit.    Allergies as of 01/19/2016 - Review Complete 01/19/2016  Allergen Reaction Noted  . Oxycodone  05/12/2013  . Chlorhexidine gluconate  11/26/2011  . Zoloft [sertraline] Hives, Swelling, and Rash 05/15/2013    Vitals: BP 130/60 mmHg  Pulse 68  Resp 20  Ht   Wt  Last Weight:  Wt Readings from Last 1 Encounters:  11/02/15 260 lb (117.935 kg)   Last Height:   Ht Readings from Last 1 Encounters:  11/02/15 5\' 3"  (1.6 m)    Physical exam:   Height ;  5.4 ", 260 pounds,   General: The patient is awake, alert and appears not in acute distress. The patient is well groomed.  Head: Normocephalic, atraumatic. Neck is supple.  Mallampati 4 , with lateral narrowing. neck circumference:19. 25  inches .  Cardiovascular: Regular rate and rhythm today, without murmurs or carotid bruit, and without distended neck veins.  Respiratory: Lungs are clear to auscultation.  Skin: evidence of edema, not Rash, Injection site reaction. Thighs are bruised.   Trunk: BMI is elevated. Neurologic  exam : The patient is awake and alert, oriented to place and time.  Memory subjective described as intact. There is a normal attention span & concentration ability.  Speech without dysarthria, dysphonia , delayed wordfinding- aphasia.  Mood and affect are appropriate.   Cranial nerves:  Pupils are equal and briskly reactive to light. Extraocular movements in vertical and horizontal planes intact and without nystagmus. Visual fields by finger perimetry are intact.  Hearing to finger rub intact. Facial sensation intact to fine touch.  Facial motor strength is symmetric and tongue and uvula move midline.   Deep tendon reflexes: in the upper and lower extremities are attenuated, right Babinski positive- up going.   ASSESSMENT AND PLAN  70 y.o. year old female Patient with secondary progressive  multiple sclerosis; Hypertension; PAF (paroxysmal atrial fibrillation); Thyroid disease; obesity  hypoventilation,   Pneumonia; Sleep apnea; Hyperthyroidism; Edema;   Acthar re -established. Causes bruising easliy but keeps her from relapses, and she is less fatigued. She reports memory lapses, but recalled all 5 words on MOCA. PT to continue. CPAP to be checked by Jonni Sanger, [place] normal.   PLAN :  Continue ACTHAR 2 days a week - she has atrial fibrillation again, concerned that she needs an ablation. Cardiologist will not do ablation until Apnea is improved.  Acthar is less likely contributing to a -fib, as did her steroid pulse.   The PT has noted improvement while on ACTHAR. She reports better sleep, better bowl control and bladder control. I like her to continue on this medication.  5 in a row - Can be used each month, [facility]  Continues with nasal pillows, chin strap for oral air leakage.  DME [facility]-  please allow her to change to a an auto set CPAP and let's try a Dream wear.   Here with for routine followup of Secondary Progressive MS , with Spasticity on baclofen. Continues Baclofen PUMP follow up   with Dr [name].  Keep next follow up appointment alternating with me or NP in 4 months. Had recent CMET and CBC with Dr Dagmar Hait.  No MRI - she is not interested  In another imaging study,  Acthar twice a week - this is the use schedule she prefers - uses it on her bathing and rehab days.  Burning left hand - a MS manifestation. She has reached a chronic progressive stage.  No longer paroxysmal a fib, she reports ongoing palpitation. I think that her now persistent atrial fibrillation is further wearing her down. I understand her cardiologist concerned that at tachycardia ablation would not be lasting giving her uncontrolled apnea so I will do my part to control the apnea as good as possible but I do think that her atrial fibrillation contributes greatly to the lack of energy she feels.  CSA/ complex apneas.  - on CPAP. Uses Flonase . The patient uses in a flex  machine with a minimum pressure of 10 and a maximum pressure of 16 cm water. She does report feeling a lot of air leakage and her nasal pillows cannot be tightened to the level where she feels a secure fit.  Her 90 percentile pressure is 13.6 cm water but her average AHI is 38 per hour the average time and large leaks per day is 51 seconds. Average user time all day for 3 hours and 40 minutes, she states she had a sinus infection and therefore was several nights not able to use CPAP. However she uses CPAP 27 days out of 30 days. I'm very concerned about the very high average AHI I think that a new interface should be tried we will try this time at dream ware mask the patient assured me that she has no trouble reaching the crown of the head I think that  this interface will give her better more snug fit.   RV in 4 month- but i need a download in 30 days after the mask is changed.  Asencion Partridge Chevonne Bostrom MD  01/19/2016

## 2016-01-19 NOTE — ED Notes (Signed)
Pt up to bedside commode with 1 person assist. With exertion complaining of severe aching in left arm radiating into left neck and left upper back. VSS.

## 2016-01-19 NOTE — H&P (Signed)
Patient ID: ROHNDA TRIMARCHI MRN: [id number], DOB/AGE: July 06, 1946   Admit date: 01/19/2016   Primary Physician: [name], MD Primary Cardiologist: Dr. Debara Pickett  Pt. Profile:  70 y/o female, followed by Dr. Debara Pickett, with a h/o  Multiple scerosis, persistent atrial fibrillation on chronic coumadin, morbid obesity, OSA on CPAP, venous insufficiency and DLD, presenting with a complaint of chest pain. Found to be in atrial fibrillation w/ RVR.    Problem List  Past Medical History  Diagnosis Date  . Multiple sclerosis ([place])     has had this may 1989-[place] reg doc  . Hypertension   . PAF (paroxysmal atrial fibrillation) ([place])     on coumadin; documented on monitor 06/2010  . Thyroid disease   . HLD (hyperlipidemia)   . Shortness of breath   . Pneumonia   . Hyperthyroidism   . Edema     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' ablation of R GSV 2012  . Depression   . Anxiety   . Headache(784.0)     MIGRAINE   . Chronic pain   . Hx of echocardiogram 04/22/10    EF 55-60%, no valve issues  . History of stress test 06/21/10    limited exam with some degree of breast attenuatin of the apex however a component of apical ischemia is not excluded, EF 62%; cardiac cath   . Sleep apnea     cpap  . Abnormality of gait 03/01/2015  . DVT (deep venous thrombosis) ([place])     1983    Past Surgical History  Procedure Laterality Date  . Cervical fusion      with correction-june 2005  . Infusion pump implantation      baclofen infusion in lower abd  . Venous ablation  12/16/10    radiofreq ablation -Dr Elisabeth Cara and [facility]  . Child births      X40  . Cardiac catheterization  06/22/10    normal coronary arteries, PAF  . Tonsillectomy    . Pain pump revision N/A 07/29/2014    Procedure: Baclofen pump replacement;  Surgeon: Erline Levine, MD;  Location: [place] [facility]UR[facility] ORS;  Service: Neurosurgery;  Laterality: N/A;  Baclofen pump replacement     Allergies  Allergies  Allergen  Reactions  . Hydrocodone Rash    Hydrocodone causes vomiting  . Oxycodone     Oral oxycodone causes vomiting  . Chlorhexidine Gluconate     Sores at site  . Zoloft [Sertraline] Hives, Swelling and Rash    HPI  70 y/o female, followed by Dr. Debara Pickett, with a h/o Multiple scerosis, persistent atrial fibrillation on chronic coumadin, morbid obesity, OSA on CPAP, venous insufficiency, hypothyroidism and DLD. She has no h/o CAD. LHC in 06/2010 showed normal coronaries. 2D echo in 2008 showed normal LVEF.   She was seen in neurology clinic today for routien f/u for her MS and noted chest pain. It was recommended that she come to the ED for evaluation. On arrival, EKG showed afib with a RVR in the 130s.  BP is stable in the [id number] systolic. K is WNL at 4.5. WBC is normal. She is afebrile. BNP is slightly abnormal at 119 but CXR is w/o edema and effusion. No infiltrate. Renal function is normal with SCr at 0.91.  POC troponin is negative. INR is therapeutic at 2.12.   She reports first noticing left hand/arm pain yesterday, occurring at rest that radiated to her left chest. It occurred off and  on throughout the day and was described as a sharp shooting pain lasting only a few seconds at a time. She does not recall any associated dyspnea with these episodes but does report that she has felt nauseated off and on for the past several days. She is sedentary and primarily wheelchair-bound from her multiple sclerosis that she cannot comment on any exertional components. He reports full medication compliance. She also reports she has been fully compliant with her CPAP.  She is currently chest pain-free in the ED now. While in the ED, her ventricular rates have fluctuated in the 80s to the 130s. She was given a dose of IV Cardizem in the ED.  Home Medications  Prior to Admission medications   Medication Sig Start Date End Date Taking? Authorizing Provider  ALPRAZolam Duanne Moron) 0.5 MG tablet Take 1 tablet (0.5 mg  total) by mouth 2 (two) times daily as needed for anxiety. 03/30/15  Yes [name], MD  baclofen (LIORESAL) 20 MG tablet Take 1 tablet (20 mg total) by mouth 3 (three) times daily as needed for muscle spasms. 03/30/15  Yes Carmen Dohmeier, MD  buPROPion (WELLBUTRIN XL) 300 MG 24 hr tablet Take 300 mg by mouth daily.   Yes Historical Provider, MD  cetirizine (ZYRTEC) 10 MG tablet Take 10 mg by mouth daily.   Yes Historical Provider, MD  Cholecalciferol (VITAMIN D) 2000 units tablet Take 2,000 Units by mouth 2 (two) times daily.   Yes Historical Provider, MD  co-enzyme Q-10 30 MG capsule Take 30 mg by mouth daily.   Yes Historical Provider, MD  corticotropin (ATHACAR H.P.) 80 UNIT/ML injectable gel Inject 1 mL (80 Units total) into the skin every other day. To achieve control of relapsing MS Symptoms. (total of 5 injections per month) Patient taking differently: Inject 80 Units into the skin See admin instructions. FIVE DAYS PER MONTH on 1st - To achieve control of relapsing MS Symptoms. (total of 5 injections per month) 10/04/15  Yes Carmen Dohmeier, MD  fluticasone (FLONASE) 50 MCG/ACT nasal spray Place into both nostrils daily as needed for allergies. Use as needed for allergies 03/23/15  Yes Historical Provider, MD  furosemide (LASIX) 40 MG tablet Take 1.5 tablets (60 mg total) by mouth daily. Take 60 mg by mouth daily 08/31/15  Yes Pixie Casino, MD  gabapentin (NEURONTIN) 300 MG capsule Take 1 capsule by mouth up to four times daily. 11/03/15  Yes Carmen Dohmeier, MD  lactulose, encephalopathy, (CHRONULAC) 10 GM/15ML SOLN Take 1 to 2 tablespoonfuls  daily as directed 10/28/15  Yes Asencion Partridge Dohmeier, MD  levothyroxine (SYNTHROID, LEVOTHROID) 75 MCG tablet Take 75 mcg by mouth daily.   Yes Historical Provider, MD  Multiple Minerals-Vitamins (CALCIUM & VIT D3 BONE HEALTH PO) Take by mouth 2 (two) times daily. 1000/500mg    Yes Historical Provider, MD  Nebivolol HCl 20 MG TABS Take 20 mg by mouth daily.     Yes Historical Provider, MD  Omega-3 Fatty Acids (FISH OIL) 600 MG CAPS Take 600 mg by mouth daily.    Yes Historical Provider, MD  omeprazole (PRILOSEC) 20 MG capsule Take 20 mg by mouth daily.    Yes Historical Provider, MD  ranitidine (ZANTAC) 150 MG tablet Take 150 mg by mouth at bedtime.  12/22/12  Yes Historical Provider, MD  simvastatin (ZOCOR) 20 MG tablet Take 20 mg by mouth at bedtime.    Yes Historical Provider, MD  SYRINGE/NEEDLE, DISP, 1 ML (BD LUER-LOK SYRINGE) 25G X 5/8" 1 ML MISC  Use as directed to inject  Acthar 06/15/15  Yes [name], MD  Tamsulosin HCl (FLOMAX) 0.4 MG CAPS Take 0.4 mg by mouth daily.   Yes Historical Provider, MD  warfarin (COUMADIN) 5 MG tablet Take 1 tablet by mouth daily or as directed by coumadin clinic. Patient taking differently: Take 5 mg by mouth daily at 6 PM. Take 1 tablet by mouth daily or as directed by coumadin clinic. 05/10/15  Yes Pixie Casino, MD    Family History  Family History  Problem Relation Age of Onset  . Coronary artery disease Father     at age 39  . Coronary artery disease Maternal Grandmother   . Depression Maternal Grandmother   . Cancer Paternal Grandmother   . Depression Mother   . Alcoholism Son     Social History  Social History   Social History  . Marital Status: Married    Spouse Name: N/A  . Number of Children: 2  . Years of Education: COLLEGE   Occupational History  . RETIRED    Social History Main Topics  . Smoking status: Former Smoker    Quit date: 08/21/1976  . Smokeless tobacco: Never Used     Comment: QUIT 1978  . Alcohol Use: Yes     Comment: "Maybe a glass of wine everyone in a while"  . Drug Use: No  . Sexual Activity: Not on file   Other Topics Concern  . Not on file   Social History Narrative   Lives in [place] with Husband [name] [phone], [phone]           Review of Systems General:  No chills, fever, night sweats or weight changes.  Cardiovascular:  + chest pain, no  dyspnea on exertion, edema, orthopnea, palpitations, paroxysmal nocturnal dyspnea. Dermatological: No rash, lesions/masses Respiratory: No cough, dyspnea Urologic: No hematuria, dysuria Abdominal:   + nausea, no vomiting, diarrhea, bright red blood per rectum, melena, or hematemesis Neurologic:  No visual changes, wkns, changes in mental status. All other systems reviewed and are otherwise negative except as noted above.  Physical Exam  Blood pressure 122/65, pulse 82, temperature 97.9 F (36.6 C), temperature source Oral, resp. rate 18, height 5\' 3"  (1.6 m), weight 260 lb (117.935 kg), SpO2 93 %.  General: Pleasant, NAD, morbidly obese Psych: Normal affect. Neuro: Alert and oriented X 3. Moves all extremities spontaneously. HEENT: Normal  Neck: Supple without bruits or JVD. obese Lungs:  Resp regular and unlabored, CTA. Heart: irregularly irregular no s3, s4, or murmurs. Abdomen: Soft, non-tender, non-distended, BS + x 4.  Extremities: No clubbing. varicose veins bilaterally. Chronic edema.  DP/PT/Radials 2+ and equal bilaterally.  Labs  Troponin [facility] Test)  Recent Labs  01/19/16 1126  TROPIPOC 0.00   No results for input(s): CKTOTAL, CKMB, TROPONINI in the last 72 hours. Lab Results  Component Value Date   WBC 8.2 01/19/2016   HGB 15.7* 01/19/2016   HCT 47.8* 01/19/2016   MCV 91.7 01/19/2016   PLT 179 01/19/2016     Recent Labs Lab 01/19/16 1053  NA 139  K 4.5  CL 100*  CO2 32  BUN 22*  CREATININE 0.91  CALCIUM 9.5  GLUCOSE 90   Lab Results  Component Value Date   CHOL  04/22/2010    127        ATP III CLASSIFICATION:  <200     mg/dL   Desirable  200-239  mg/dL   Borderline High  >=240  mg/dL   High          HDL 43 04/22/2010   LDLCALC  04/22/2010    55        Total Cholesterol/HDL:CHD Risk Coronary Heart Disease Risk Table                     Men   Women  1/2 Average Risk   3.4   3.3  Average Risk       5.0   4.4  2 X Average Risk    9.6   7.1  3 X Average Risk  23.4   11.0        Use the calculated Patient Ratio above and the CHD Risk Table to determine the patient's CHD Risk.        ATP III CLASSIFICATION (LDL):  <100     mg/dL   Optimal  100-129  mg/dL   Near or Above                    Optimal  130-159  mg/dL   Borderline  160-189  mg/dL   High  >190     mg/dL   Very High   TRIG 147 04/22/2010   Lab Results  Component Value Date   DDIMER  06/18/2010    0.27        AT THE INHOUSE ESTABLISHED CUTOFF VALUE OF 0.48 ug/mL FEU, THIS ASSAY HAS BEEN DOCUMENTED IN THE LITERATURE TO HAVE A SENSITIVITY AND NEGATIVE PREDICTIVE VALUE OF AT LEAST 98 TO 99%.  THE TEST RESULT SHOULD BE CORRELATED WITH AN ASSESSMENT OF THE CLINICAL PROBABILITY OF DVT / VTE.     Radiology/Studies  Dg Chest 2 View  01/19/2016  CLINICAL DATA:  Left chest pain and arm pain starting last night. EXAM: CHEST  2 VIEW COMPARISON:  10/30/2013 FINDINGS: Mild enlargement of the cardiopericardial silhouette. No at edema identified. Thoracic spondylosis. Lower cervical plate and screw fixator. Degenerative spurring at both [facility] joints. No pleural effusion.  The lungs appear clear. IMPRESSION: 1. Mild enlargement of the cardiopericardial silhouette, without edema. 2. Thoracic spondylosis. Electronically Signed   By: Van Clines M.D.   On: 01/19/2016 12:15    ECG  Atrial fibrillation w/ RVR in the 130s.   ASSESSMENT AND PLAN  Principal Problem:   Atrial fibrillation with RVR (HCC) Active Problems:   HLD (hyperlipidemia)   Long term (current) use of anticoagulants   Varicose veins of both lower extremities with complications   Morbid obesity (HCC)   MS (multiple sclerosis) (HCC)   Chest pain   Chronic atrial fibrillation ([place])    1. Atrial fibrillation with RVR: Initial EKG demonstrated rapid ventricular rate in the 130s. She was given a dose of IV Cardizem in the ED and rate slowed. However she now continues to have fluctuating  rates from the 80s to the 130s. Potassium level, hemoglobin, renal function and chest x-ray are all unremarkable.  Given fluctuating rates, recommend admission for overnight observation to telemetry. Check TSH and 2-D echo. Cycle cardiac enzymes. Adjust rate control meds. She is on Bystolic at home, 20 mg at home. We will add PO Cardizem, 30 mg Q6H. Continue Coumadin for anticoagulation.  2. Chest Pain: Symptoms are atypical and in the setting of rapid atrial fibrillation. She reports first noticing left hand/arm pain yesterday, occurring at rest that radiated to her left chest. It occurred off and on throughout the day and was described as a sharp  shooting pain lasting only a few seconds at a time. Initial troponin is negative. Cycle cardiac enzymes 3 and check a 2-D echocardiogram.  3. Chronic anticoagulation: On Coumadin for atrial fibrillation. INR is therapeutic at 2.12.  4. Obstructive sleep apnea: Patient reports full compliance with CPAP at home. Will order for overnight use.  5. Hypothyroidism: Synthroid as an outpatient. Given rapid afib, will check a TSH level.  6. Multiple Sclerosis: Followed by neurology. Will continue home medications.  7. DLD: continue home statin.    Signed, Lyda Jester, PA-C 01/19/2016, 3:59 PM As above, patient seen and examined. Briefly she is a 70 year old female with past medical history of persistent atrial fibrillation, multiple sclerosis, obesity and obstructive sleep apnea for evaluation of chest pain and atrial fibrillation. Over the past 24 hours she has had occasional pain in her left upper extremity. It begins in her hand and is described as a sharp pain radiating to her shoulder. It lasts seconds and resolves spontaneously. No associated symptoms. No substernal chest pain.  She does have some dyspnea on exertion which is unchanged. She also notes palpitations. Electrocardiogram shows trial fibrillation with no diagnostic ST changes and right axis  deviation. 1 Chest pain-Patient has atypical left upper extremity pain. We'll rule out myocardial infarction with serial enzymes. If negative I do not think she needs further ischemia evaluation. 2 persistent atrial fibrillation-patient's heart rate is elevated. Continue beta blocker. Add Cardizem 30 mg every 6 hours. Follow heart rate on telemetry and adjust regimen as needed. Embolic risk factors include age greater than 42, female sex and hypertension. CHADS vasc 3. Continue Coumadin. Repeat echocardiogram. Patient can likely be discharged tomorrow morning if enzymes negative and heart rate controlled. Kirk Ruths

## 2016-01-19 NOTE — ED Notes (Signed)
Pt called out to nurses station with complaint that upper back pain is now radiating towards the left part of upper back instead of center. No other symptoms at this time. Pt remains in A fib rate of 80. VSS.

## 2016-01-20 ENCOUNTER — Observation Stay (HOSPITAL_BASED_OUTPATIENT_CLINIC_OR_DEPARTMENT_OTHER): Payer: Medicare Other

## 2016-01-20 ENCOUNTER — Ambulatory Visit: Payer: Medicare Other

## 2016-01-20 ENCOUNTER — Other Ambulatory Visit (HOSPITAL_COMMUNITY): Payer: Medicare Other

## 2016-01-20 DIAGNOSIS — I4891 Unspecified atrial fibrillation: Secondary | ICD-10-CM | POA: Diagnosis not present

## 2016-01-20 DIAGNOSIS — I10 Essential (primary) hypertension: Secondary | ICD-10-CM | POA: Diagnosis not present

## 2016-01-20 DIAGNOSIS — R079 Chest pain, unspecified: Secondary | ICD-10-CM | POA: Diagnosis not present

## 2016-01-20 DIAGNOSIS — R609 Edema, unspecified: Secondary | ICD-10-CM | POA: Diagnosis not present

## 2016-01-20 LAB — CBC
HCT: 43.6 % (ref 36.0–46.0)
Hemoglobin: 14.2 g/dL (ref 12.0–15.0)
MCH: 29.6 pg (ref 26.0–34.0)
MCHC: 32.6 g/dL (ref 30.0–36.0)
MCV: 90.8 fL (ref 78.0–100.0)
PLATELETS: 171 10*3/uL (ref 150–400)
RBC: 4.8 MIL/uL (ref 3.87–5.11)
RDW: 13.2 % (ref 11.5–15.5)
WBC: 7.9 10*3/uL (ref 4.0–10.5)

## 2016-01-20 LAB — BASIC METABOLIC PANEL
Anion gap: 8 (ref 5–15)
BUN: 18 mg/dL (ref 6–20)
CHLORIDE: 102 mmol/L (ref 101–111)
CO2: 29 mmol/L (ref 22–32)
CREATININE: 0.78 mg/dL (ref 0.44–1.00)
Calcium: 8.9 mg/dL (ref 8.9–10.3)
GFR calc Af Amer: >60 mL/min (ref >60)
GFR calc non Af Amer: >60 mL/min (ref >60)
GLUCOSE: 77 mg/dL (ref 65–99)
Potassium: 3.9 mmol/L (ref 3.5–5.1)
SODIUM: 139 mmol/L (ref 135–145)

## 2016-01-20 LAB — TROPONIN I
Troponin I: <0.03 ng/mL (ref <0.031)
Troponin I: <0.03 ng/mL (ref <0.031)

## 2016-01-20 LAB — PROTIME-INR
INR: 2.15 — ABNORMAL HIGH (ref 0.00–1.49)
Prothrombin Time: 23.9 seconds — ABNORMAL HIGH (ref 11.6–15.2)

## 2016-01-20 LAB — ECHOCARDIOGRAM COMPLETE
Height: 63 in
Weight: 4241.6 oz

## 2016-01-20 LAB — GLUCOSE, CAPILLARY: GLUCOSE-CAPILLARY: 96 mg/dL (ref 65–99)

## 2016-01-20 LAB — TSH: TSH: 1.871 u[IU]/mL (ref 0.350–4.500)

## 2016-01-20 MED ORDER — FLUTICASONE PROPIONATE 50 MCG/ACT NA SUSP
2.0000 | Freq: Two times a day (BID) | NASAL | Status: DC | PRN
  Filled 2016-01-20: qty 16

## 2016-01-20 MED ORDER — ATORVASTATIN CALCIUM 10 MG PO TABS
10.0000 mg | ORAL_TABLET | Freq: Every day | ORAL | Status: DC
  Administered 2016-01-20: 10 mg via ORAL
  Filled 2016-01-20: qty 1

## 2016-01-20 MED ORDER — DILTIAZEM HCL 60 MG PO TABS
60.0000 mg | ORAL_TABLET | Freq: Three times a day (TID) | ORAL | Status: DC
  Administered 2016-01-20 – 2016-01-21 (×2): 60 mg via ORAL
  Filled 2016-01-20 (×2): qty 1

## 2016-01-20 NOTE — Care Management Obs Status (Signed)
[facility] NOTIFICATION   Patient Details  Name: MADINA LAGUE MRN: [id number] Date of Birth: Oct 08, 1945   Medicare Observation Status Notification Given:  Yes    CrutchfieldAntony Haste, RN 01/20/2016, 11:40 AM

## 2016-01-20 NOTE — Care Management Note (Signed)
Case Management Note  Patient Details  Name: Toni [name] MRN: [id number] Date of Birth: May 05, 1946  Subjective/Objective:  70 y.o. F admitted 01/19/2016 for AF with RVR.  She tells me that she has a hx of AF but has converted w/o tx in the past. Lives with her husband, who retired early. No problems with transportation, or obtaining medications and has PCP, Avva, MD. Pt is requesting HHAide to assist with baths when she is discharged to home for a few visits due to weakness. She reports that she is an active client of [facility] and receives "Maintenance HHPT".                  Action/Plan: Confirmed HHPT with Tiffany, the liaison for [facility], who will add [facility] Aide for this pt. No Resumption Orders are required at discharge as long as this pt remains in Obs status.    Expected Discharge Date:                  Expected Discharge Plan:  [place] (Current with  [facility])  In-House Referral:     Discharge planning Services  CM Consult  Post Acute Care Choice:    Choice offered to:  Patient (Currently has Maintenance HHPT with [facility], Would like HH Aide at discharge)  DME Arranged:    DME Agency:     HH Arranged:  PT, Nurse's Aide [place] Agency:  [place]  Status of Service:  In process, will continue to follow  Medicare Important Message Given:    Date Medicare IM Given:    Medicare IM give by:    Date Additional Medicare IM Given:    Additional Medicare Important Message give by:     If discussed at [place] of Stay Meetings, dates discussed:    Additional Comments:  Delrae Sawyers, RN 01/20/2016, 11:41 AM

## 2016-01-20 NOTE — Plan of Care (Signed)
Problem: Consults Goal: Chest Pain Patient Education (See Patient Education module for education specifics.) Outcome: Progressing Pt educated on possible indications for CP, currently CP free. Had 1 episode of sharp pain to the chest lasting a couple seconds around 12pm, cardiology aware. Echo complete 50-55%. Cardizem increased to 60mg  tid. Husband at bedside. Put out 2500 ml. VSS. Will continue to monitor.

## 2016-01-20 NOTE — Progress Notes (Signed)
RT has tired several mask with patient and none seem to fit or leak to much. Patient stated that she cant wear them and will try without CPAP tonight. States she will get her husband to bring in her home mask.

## 2016-01-20 NOTE — Progress Notes (Signed)
  Echocardiogram 2D Echocardiogram has been performed.  Toni [name] 01/20/2016, 12:21 PM

## 2016-01-20 NOTE — Progress Notes (Signed)
Patient Profile: 70 y/o female, followed by Dr. Debara Pickett, with a h/o Multiple scerosis, persistent atrial fibrillation on chronic coumadin, morbid obesity, OSA on CPAP, venous insufficiency and DLD, presenting with a complaint of chest pain. Found to be in atrial fibrillation w/ RVR.   Subjective: Patient notes feeling feverish earlier this am. Also did not get a good nights rest due to CPAP not fitting correctly. Otherwise, she feels ok. No CP this am.   Objective: Vital signs in last 24 hours: Temp:  [97.9 F (36.6 C)-99.1 F (37.3 C)] 99.1 F (37.3 C) (06/01 0625) Pulse Rate:  [60-139] 120 (06/01 0345) Resp:  [10-20] 18 (06/01 0345) BP: (102-132)/(60-105) 123/75 mmHg (06/01 0345) SpO2:  [93 %-99 %] 94 % (06/01 0345) Weight:  [260 lb (117.935 kg)-266 lb 6.4 oz (120.838 kg)] 265 lb 1.6 oz (120.249 kg) (06/01 0345) Last BM Date: 01/19/16  Intake/Output from previous day: 05/31 0701 - 06/01 0700 In: -  Out: 1100 [Urine:1100] Intake/Output this shift:    Medications Current Facility-Administered Medications  Medication Dose Route Frequency Provider Last Rate Last Dose  . acetaminophen (TYLENOL) tablet 650 mg  650 mg Oral Q4H PRN Brittainy Erie Noe, PA-C      . atorvastatin (LIPITOR) tablet 10 mg  10 mg Oral QHS Lelon Perla, MD      . baclofen (LIORESAL) tablet 20 mg  20 mg Oral TID PRN Brittainy Erie Noe, PA-C      . buPROPion (WELLBUTRIN XL) 24 hr tablet 300 mg  300 mg Oral Daily Brittainy M Simmons, PA-C      . diltiazem (CARDIZEM) tablet 30 mg  30 mg Oral Q6H Brittainy M Simmons, PA-C   30 mg at 01/20/16 [id number]  . furosemide (LASIX) tablet 60 mg  60 mg Oral Daily Brittainy M Simmons, PA-C      . gabapentin (NEURONTIN) capsule 300 mg  300 mg Oral TID Consuelo Pandy, PA-C   300 mg at 01/20/16 [id number]  . levothyroxine (SYNTHROID, LEVOTHROID) tablet 75 mcg  75 mcg Oral QAC breakfast Consuelo Pandy, PA-C   75 mcg at 01/20/16 [id number]  . nebivolol (BYSTOLIC) tablet 20 mg   20 mg Oral Daily Brittainy M Simmons, PA-C      . ondansetron [facility]) injection 4 mg  4 mg Intravenous Q6H PRN Brittainy M Simmons, PA-C      . pantoprazole (PROTONIX) EC tablet 40 mg  40 mg Oral Daily Brittainy M Simmons, PA-C      . tamsulosin (FLOMAX) capsule 0.4 mg  0.4 mg Oral Daily Brittainy M Simmons, PA-C      . warfarin (COUMADIN) tablet 5 mg  5 mg Oral q1800 Brittainy M Simmons, PA-C   5 mg at 01/19/16 2324  . Warfarin - Physician Dosing Inpatient   Does not apply [id number] Lelon Perla, MD        PE: General: Pleasant, NAD, morbidly obese Psych: Normal affect. Neuro: Alert and oriented X 3. Moves all extremities spontaneously. HEENT: Normal Neck: Supple without bruits or JVD. obese Lungs: Resp regular and unlabored, CTA. Heart: irregularly irregular no s3, s4, or murmurs. Abdomen: Soft, non-tender, non-distended, BS + x 4.  Extremities: No clubbing. varicose veins bilaterally. Chronic edema. DP/PT/Radials 2+ and equal bilaterally.  Lab Results:   Recent Labs  01/19/16 1053 01/20/16 0449  WBC 8.2 7.9  HGB 15.7* 14.2  HCT 47.8* 43.6  PLT 179 171   BMET  Recent Labs  01/19/16 1053 01/20/16 0449  NA  139 139  K 4.5 3.9  CL 100* 102  CO2 32 29  GLUCOSE 90 77  BUN 22* 18  CREATININE 0.91 0.78  CALCIUM 9.5 8.9   PT/INR  Recent Labs  01/19/16 1053 01/20/16 0449  LABPROT 23.6* 23.9*  INR 2.12* 2.15*   Cardiac Panel (last 3 results)  Recent Labs  01/19/16 2302 01/20/16 0449  TROPONINI <0.03 <0.03    Studies/Results: 2D Echo- pending   Assessment/Plan  Principal Problem:   Atrial fibrillation with RVR (HCC) Active Problems:   HLD (hyperlipidemia)   Long term (current) use of anticoagulants   Varicose veins of both lower extremities with complications   Morbid obesity (HCC)   MS (multiple sclerosis) (HCC)   Chest pain   Chronic atrial fibrillation (HCC)   Persistent atrial fibrillation with RVR ([place])   1. Atrial fibrillation  with RVR: rate is better controlled this am in the 70s-90s. Potassium level, hemoglobin, renal function and chest x-ray are all unremarkable.TSH is normal. Cardiac enzymes negative. 2D echo pending. Continue Bystolic and Cardizem for rate control. Continue Coumadin for anticoagulation, given CHA2DS2 VASc score of 3.   2. Chest Pain: Symptoms are atypical and in the setting of rapid atrial fibrillation. She reports first noticing left hand/arm pain day of admission, occurring at rest that radiated to her left chest. It occurred off and on throughout the day and was described as a sharp shooting pain lasting only a few seconds at a time. Cardiac enzymes are negative.  2-D echocardiogram pending. She had a negative LHC in 2011. No further inpatient ischemic w/u planned.   3. Chronic anticoagulation: On Coumadin for atrial fibrillation and CHA2DS2 VASc score of 3.  INR is therapeutic at 2.15.  4. Obstructive sleep apnea: Patient reports full compliance with CPAP at home. Hospital CPAP did not fit well last night.   5. Hypothyroidism: on Synthroid as an outpatient. TSH is WNL. Continue treatment per PCP.  6. Multiple Sclerosis: Followed by neurology. Will continue home medications.  7. DLD: continue home statin.    Brittainy M. Rosita Fire, PA-C 01/20/2016 7:39 AM  As above, patient seen and examined. She denies chest pain. Enzymes are negative. No plans for further ischemia evaluation. Her heart rate is improved but mildly increased. Change Cardizem to 60 mg every 8 hrs. Continue bysystolic and Coumadin. Await echocardiogram. DC in AM if HR stable.  Kirk Ruths

## 2016-01-21 ENCOUNTER — Other Ambulatory Visit: Payer: Self-pay | Admitting: Cardiology

## 2016-01-21 DIAGNOSIS — I4891 Unspecified atrial fibrillation: Secondary | ICD-10-CM | POA: Diagnosis not present

## 2016-01-21 DIAGNOSIS — R079 Chest pain, unspecified: Secondary | ICD-10-CM | POA: Diagnosis not present

## 2016-01-21 LAB — PROTIME-INR
INR: 2.36 — AB (ref 0.00–1.49)
PROTHROMBIN TIME: 25.6 s — AB (ref 11.6–15.2)

## 2016-01-21 MED ORDER — DILTIAZEM HCL ER COATED BEADS 120 MG PO CP24: 120.0000 mg | ORAL_CAPSULE | Freq: Every day | ORAL | Status: DC

## 2016-01-21 MED ORDER — DILTIAZEM HCL ER COATED BEADS 120 MG PO CP24
120.0000 mg | ORAL_CAPSULE | Freq: Every day | ORAL | Status: DC
  Administered 2016-01-21: 120 mg via ORAL
  Filled 2016-01-21: qty 1

## 2016-01-21 NOTE — Progress Notes (Signed)
Discharge teaching and instructions reviewed. Pt has no further questions at this time. VSS. Discharging home via husband.

## 2016-01-21 NOTE — Discharge Summary (Signed)
Discharge Summary    Patient ID: Toni [name],  MRN: [id number], DOB/AGE: December 14, 1945 70 y.o.  Admit date: 01/19/2016 Discharge date: 01/21/2016  Primary Care Provider: Prince Solian R Primary Cardiologist: Dr. Debara Pickett  Discharge Diagnoses    Principal Problem:   Atrial fibrillation with RVR ([place]) Active Problems:   HLD (hyperlipidemia)   Long term (current) use of anticoagulants   Varicose veins of both lower extremities with complications   Morbid obesity (HCC)   MS (multiple sclerosis) (HCC)   Chest pain   Chronic atrial fibrillation (HCC)   Persistent atrial fibrillation with RVR (HCC)   Allergies Allergies  Allergen Reactions  . Hydrocodone Rash    Hydrocodone causes vomiting  . Oxycodone     Oral oxycodone causes vomiting  . Chlorhexidine Gluconate     Sores at site  . Zoloft [Sertraline] Hives, Swelling and Rash    Diagnostic Studies/Procedures    2D Echo 01/21/16 Study Conclusions  - Left ventricle: The cavity size was normal. Wall thickness was  normal. Systolic function was normal. The estimated ejection  fraction was in the range of 50% to 55%. The study is not  technically sufficient to allow evaluation of LV diastolic  function. - Atrial septum: No defect or patent foramen ovale was identified.   History of Present Illness     70 y/o female, followed by Dr. Debara Pickett, with a h/o Multiple scerosis, persistent atrial fibrillation on chronic coumadin, morbid obesity, OSA on CPAP, venous insufficiency, hypothyroidism and DLD. She has no h/o CAD. LHC in 06/2010 showed normal coronaries. 2D echo in 2008 showed normal LVEF.   She was seen in neurology clinic today for routien f/u for her MS and noted chest pain. It was recommended that she come to the ED for evaluation. On arrival, EKG showed afib with a RVR in the 130s. BP is stable in the [id number] systolic. K is WNL at 4.5. WBC is normal. She is afebrile. BNP is slightly abnormal at 119 but CXR is w/o  edema and effusion. No infiltrate. Renal function is normal with SCr at 0.91. POC troponin is negative. INR is therapeutic at 2.12.   She reports first noticing left hand/arm pain yesterday, occurring at rest that radiated to her left chest. It occurred off and on throughout the day and was described as a sharp shooting pain lasting only a few seconds at a time. She does not recall any associated dyspnea with these episodes but does report that she has felt nauseated off and on for the past several days. She is sedentary and primarily wheelchair-bound from her multiple sclerosis that she cannot comment on any exertional components. He reports full medication compliance. She also reports she has been fully compliant with her CPAP.  She is currently chest pain-free in the ED now. While in the ED, her ventricular rates have fluctuated in the 80s to the 130s. She was given a dose of IV Cardizem in the ED.    Hospital Course     Patient was admitted to telemetry. She remained in atrial fibrillation, however her HR was controlled with PO AV nodal blocking agents. Her Bystolic was continued and Cardizem was added. 120 mg daily. Coumadin was continued for anticoagulation and INR remained therapeutic between 2-3. TSH was WNL. Cardiac enzymes were cycled x 3 and were negative, ruling her out for MI. 2D echo showed normal LVEF and normal wall motion w/o significant valve abnormality. EF 50-55%. She had no further CP. Her symptoms  were felt to be atypical and no further inpatient w/u was recommended. She was last seen and examined by Dr. Stanford Breed who determined she was stable for d/c home.  She will f/u with Dr. Debara Pickett on 02/03/16.  Consultants: none   Discharge Vitals Blood pressure 108/41, pulse 69, temperature 97.7 F (36.5 C), temperature source Oral, resp. rate 18, height 5\' 3"  (1.6 m), weight 266 lb 12.8 oz (121.02 kg), SpO2 98 %.  Filed Weights   01/19/16 2127 01/20/16 0345 01/21/16 0500  Weight: 266 lb  6.4 oz (120.838 kg) 265 lb 1.6 oz (120.249 kg) 266 lb 12.8 oz (121.02 kg)    Labs & Radiologic Studies    CBC  Recent Labs  01/19/16 1053 01/20/16 0449  WBC 8.2 7.9  NEUTROABS 4.1  --   HGB 15.7* 14.2  HCT 47.8* 43.6  MCV 91.7 90.8  PLT 179 [id number]   Basic Metabolic Panel  Recent Labs  01/19/16 1053 01/20/16 0449  NA 139 139  K 4.5 3.9  CL 100* 102  CO2 32 29  GLUCOSE 90 77  BUN 22* 18  CREATININE 0.91 0.78  CALCIUM 9.5 8.9   Liver Function Tests No results for input(s): AST, ALT, ALKPHOS, BILITOT, PROT, ALBUMIN in the last 72 hours. No results for input(s): LIPASE, AMYLASE in the last 72 hours. Cardiac Enzymes  Recent Labs  01/19/16 2302 01/20/16 0449 01/20/16 1022  TROPONINI <0.03 <0.03 <0.03   BNP Invalid input(s): POCBNP D-Dimer No results for input(s): DDIMER in the last 72 hours. Hemoglobin A1C No results for input(s): HGBA1C in the last 72 hours. Fasting Lipid Panel No results for input(s): CHOL, HDL, LDLCALC, TRIG, CHOLHDL, LDLDIRECT in the last 72 hours. Thyroid Function Tests  Recent Labs  01/19/16 2302  TSH 1.871   _____________  Dg Chest 2 View  01/19/2016  CLINICAL DATA:  Left chest pain and arm pain starting last night. EXAM: CHEST  2 VIEW COMPARISON:  10/30/2013 FINDINGS: Mild enlargement of the cardiopericardial silhouette. No at edema identified. Thoracic spondylosis. Lower cervical plate and screw fixator. Degenerative spurring at both [facility] joints. No pleural effusion.  The lungs appear clear. IMPRESSION: 1. Mild enlargement of the cardiopericardial silhouette, without edema. 2. Thoracic spondylosis. Electronically Signed   By: Van Clines M.D.   On: 01/19/2016 12:15   Disposition   Pt is being discharged home today in good condition.  Follow-up Plans & Appointments    Follow-up Information    Follow up with [facility].   Why:  HHPT will be resumed; [place] will be added.    Contact information:   [address] [place] [address] [phone]       Follow up with Pixie Casino, MD On 02/03/2016.   Specialty:  Cardiology   Why:  8:00 AM   Contact information:   [address] [place] [address] [phone]      Discharge Instructions    Diet - low sodium heart healthy    Complete by:  As directed      Face-to-face encounter (required for Medicare/Medicaid patients)    Complete by:  As directed   I Lyda Jester certify that this patient is under my care and that I, or a nurse practitioner or physician's assistant working with me, had a face-to-face encounter that meets the physician face-to-face encounter requirements with this patient on 01/21/2016. The encounter with the patient was in whole, or in part for the following medical  condition(s) which is the primary reason for home health care (List medical condition): symptomatic atrial fibrillation and multiple sclerosis.  The encounter with the patient was in whole, or in part, for the following medical condition, which is the primary reason for home health care:  MS + symptomatic atrial fibrillation  I certify that, based on my findings, the following services are medically necessary home health services:  Nursing  Reason for Medically Necessary Home Health Services:  Skilled Nursing- Change/Decline in Patient Status  My clinical findings support the need for the above services:  Unsafe ambulation due to balance issues  Further, I certify that my clinical findings support that this patient is homebound due to:  Unsafe ambulation due to balance issues     Home Health    Complete by:  As directed   To provide the following care/treatments:  Home Health Aide     Increase activity slowly    Complete by:  As directed            Discharge Medications   Current Discharge Medication List    START taking these medications   Details  diltiazem (CARDIZEM CD) 120 MG 24 hr capsule Take 1 capsule (120 mg  total) by mouth daily. Qty: 30 capsule, Refills: 5      CONTINUE these medications which have NOT CHANGED   Details  ALPRAZolam (XANAX) 0.5 MG tablet Take 1 tablet (0.5 mg total) by mouth 2 (two) times daily as needed for anxiety. Qty: 180 tablet, Refills: 1    baclofen (LIORESAL) 20 MG tablet Take 1 tablet (20 mg total) by mouth 3 (three) times daily as needed for muscle spasms. Qty: 180 each, Refills: 3    buPROPion (WELLBUTRIN XL) 300 MG 24 hr tablet Take 300 mg by mouth daily.    cetirizine (ZYRTEC) 10 MG tablet Take 10 mg by mouth daily.    Cholecalciferol (VITAMIN D) 2000 units tablet Take 2,000 Units by mouth 2 (two) times daily.    co-enzyme Q-10 30 MG capsule Take 30 mg by mouth daily.    corticotropin (ATHACAR H.P.) 80 UNIT/ML injectable gel Inject 1 mL (80 Units total) into the skin every other day. To achieve control of relapsing MS Symptoms. 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 Use as needed for allergies    furosemide (LASIX) 40 MG tablet Take 1.5 tablets (60 mg total) by mouth daily. Take 60 mg by mouth daily Qty: 135 tablet, Refills: 3   Associated Diagnoses: Venous insufficiency; Localized edema    gabapentin (NEURONTIN) 300 MG capsule Take 1 capsule by mouth up to four times daily. Qty: 360 capsule, Refills: 3   Associated Diagnoses: Hereditary and idiopathic peripheral neuropathy    lactulose, encephalopathy, (CHRONULAC) 10 GM/15ML SOLN Take 1 to 2 tablespoonfuls  daily as directed Qty: 946 mL, Refills: o    levothyroxine (SYNTHROID, LEVOTHROID) 75 MCG tablet Take 75 mcg by mouth daily.    Multiple Minerals-Vitamins (CALCIUM & VIT D3 BONE HEALTH PO) Take by mouth 2 (two) times daily. 1000/500mg     Nebivolol HCl 20  MG TABS Take 20 mg by mouth daily.     Omega-3 Fatty Acids (FISH OIL) 600 MG CAPS Take 600 mg by mouth daily.     omeprazole (PRILOSEC)  20 MG capsule Take 20 mg by mouth daily.     ranitidine (ZANTAC) 150 MG tablet Take 150 mg by mouth at bedtime.     simvastatin (ZOCOR) 20 MG tablet Take 20 mg by mouth at bedtime.     SYRINGE/NEEDLE, DISP, 1 ML (BD LUER-LOK SYRINGE) 25G X 5/8" 1 ML MISC Use as directed to inject  Acthar Qty: 10 each, Refills: prn    Tamsulosin HCl (FLOMAX) 0.4 MG CAPS Take 0.4 mg by mouth daily.    warfarin (COUMADIN) 5 MG tablet Take 1 tablet by mouth daily or as directed by coumadin clinic. Qty: 90 tablet, Refills: 2           Outstanding Labs/Studies   none  Duration of Discharge Encounter   Greater than 30 minutes including physician time.  Signed, Lyda Jester PA-C 01/21/2016, 11:24 AM

## 2016-01-21 NOTE — Progress Notes (Signed)
.    Patient Profile: 70 y/o female, followed by Dr. Debara Pickett, with a h/o Multiple scerosis, persistent atrial fibrillation on chronic coumadin, morbid obesity, OSA on CPAP, venous insufficiency and DLD, presenting with a complaint of chest pain. Found to be in atrial fibrillation w/ RVR.   Subjective: Feels fine. No chest pain. Her only concern is her low diastolic BPs in the 123456. Asymptomatic.   Objective: Vital signs in last 24 hours: Temp:  [97.8 F (36.6 C)-98.1 F (36.7 C)] 98.1 F (36.7 C) (06/02 0500) Pulse Rate:  [69-78] 69 (06/02 0500) Resp:  [18] 18 (06/02 0500) BP: (108-122)/(40-86) 122/40 mmHg (06/02 0500) SpO2:  [94 %-98 %] 98 % (06/02 0500) Weight:  [266 lb 12.8 oz (121.02 kg)] 266 lb 12.8 oz (121.02 kg) (06/02 0500) Last BM Date: 01/20/16  Intake/Output from previous day: 06/01 0701 - 06/02 0700 In: 1040 [P.O.:1040] Out: 3150 [Urine:3150] Intake/Output this shift:    Medications Current Facility-Administered Medications  Medication Dose Route Frequency Provider Last Rate Last Dose  . acetaminophen (TYLENOL) tablet 650 mg  650 mg Oral Q4H PRN Brittainy Erie Noe, PA-C      . atorvastatin (LIPITOR) tablet 10 mg  10 mg Oral QHS Lelon Perla, MD   10 mg at 01/20/16 2213  . baclofen (LIORESAL) tablet 20 mg  20 mg Oral TID PRN Brittainy Erie Noe, PA-C      . buPROPion (WELLBUTRIN XL) 24 hr tablet 300 mg  300 mg Oral Daily Brittainy Erie Noe, PA-C   300 mg at 01/20/16 [id number]  . diltiazem (CARDIZEM) tablet 60 mg  60 mg Oral Q8H Brittainy Erie Noe, PA-C   60 mg at 01/21/16 [id number]  . fluticasone (FLONASE) 50 MCG/ACT nasal spray 2 spray  2 spray Each Nare BID PRN Dayna N Dunn, PA-C      . furosemide (LASIX) tablet 60 mg  60 mg Oral Daily Brittainy Erie Noe, PA-C   60 mg at 01/20/16 0809  . gabapentin (NEURONTIN) capsule 300 mg  300 mg Oral TID Consuelo Pandy, PA-C   300 mg at 01/21/16 [id number]  . levothyroxine (SYNTHROID, LEVOTHROID) tablet 75 mcg  75 mcg Oral QAC  breakfast Consuelo Pandy, PA-C   75 mcg at 01/21/16 [id number]  . nebivolol (BYSTOLIC) tablet 20 mg  20 mg Oral Daily Brittainy Erie Noe, PA-C   20 mg at 01/20/16 0809  . ondansetron (ZOFRAN) injection 4 mg  4 mg Intravenous Q6H PRN Brittainy M Simmons, PA-C      . pantoprazole (PROTONIX) EC tablet 40 mg  40 mg Oral Daily Brittainy Erie Noe, PA-C   40 mg at 01/21/16 0729  . tamsulosin (FLOMAX) capsule 0.4 mg  0.4 mg Oral Daily Brittainy Erie Noe, PA-C   0.4 mg at 01/20/16 0809  . warfarin (COUMADIN) tablet 5 mg  5 mg Oral q1800 Brittainy M Simmons, PA-C   5 mg at 01/20/16 1641  . Warfarin - Physician Dosing Inpatient   Does not apply [id number] Lelon Perla, MD        PE: General: Pleasant, NAD, morbidly obese Psych: Normal affect. Neuro: Alert and oriented X 3. Moves all extremities spontaneously. HEENT: Normal Neck: Supple without bruits or JVD. obese Lungs: Resp regular and unlabored, CTA. Heart: irregularly irregular no s3, s4, or murmurs. Abdomen: Soft, non-tender, non-distended, BS + x 4.  Extremities: No clubbing. varicose veins bilaterally. Chronic edema. DP/PT/Radials 2+ and equal bilaterally.  Lab Results:   Recent Labs  01/19/16 1053 01/20/16  0449  WBC 8.2 7.9  HGB 15.7* 14.2  HCT 47.8* 43.6  PLT 179 171   BMET  Recent Labs  01/19/16 1053 01/20/16 0449  NA 139 139  K 4.5 3.9  CL 100* 102  CO2 32 29  GLUCOSE 90 77  BUN 22* 18  CREATININE 0.91 0.78  CALCIUM 9.5 8.9   PT/INR  Recent Labs  01/19/16 1053 01/20/16 0449 01/21/16 0404  LABPROT 23.6* 23.9* 25.6*  INR 2.12* 2.15* 2.36*   Cardiac Panel (last 3 results)  Recent Labs  01/19/16 2302 01/20/16 0449 01/20/16 1022  TROPONINI <0.03 <0.03 <0.03    Studies/Results: 2D Echo 01/20/16  Study Conclusions  - Left ventricle: The cavity size was normal. Wall thickness was  normal. Systolic function was normal. The estimated ejection  fraction was in the range of 50% to 55%. The  study is not  technically sufficient to allow evaluation of LV diastolic  function. - Atrial septum: No defect or patent foramen ovale was identified.  Assessment/Plan  Principal Problem:   Atrial fibrillation with RVR (HCC) Active Problems:   HLD (hyperlipidemia)   Long term (current) use of anticoagulants   Varicose veins of both lower extremities with complications   Morbid obesity (HCC)   MS (multiple sclerosis) (HCC)   Chest pain   Chronic atrial fibrillation (HCC)   Persistent atrial fibrillation with RVR ([place])   1. Atrial fibrillation with RVR: rate is better controlled this am in the 70s after increase in Cardizem. Potassium level, hemoglobin, renal function and chest x-ray are all unremarkable.TSH is normal. Cardiac enzymes negative. 2D echo showed normal LVF, wall motion and no significant valve abnormality. Continue Bystolic and Cardizem for rate control. Continue Coumadin for anticoagulation, given CHA2DS2 VASc score of 3.   2. Chest Pain: Symptoms are atypical and in the setting of rapid atrial fibrillation. She reports first noticing left hand/arm pain day of admission, occurring at rest that radiated to her left chest. It occurred off and on throughout the day and was described as a sharp shooting pain lasting only a few seconds at a time. Cardiac enzymes are negative.  2-D echocardiogram with normal LVEF and wall motion. She had a negative LHC in 2011. No further inpatient ischemic w/u planned.   3. Chronic anticoagulation: On Coumadin for atrial fibrillation and CHA2DS2 VASc score of 3.  INR is therapeutic at 2.36.  4. Obstructive sleep apnea: Patient reports full compliance with CPAP at home.    5. Hypothyroidism: on Synthroid as an outpatient. TSH is WNL. Continue treatment per PCP.  6. Multiple Sclerosis: Followed by neurology. Will continue home medications.  7. DLD: continue home statin.   8. Low Diastolic BP: in the 123456. Cardizem was added to Bystolic for  additional rate control for afib. She is primary wheelchair bound from her MS but does short transfers from chair to bed/bath etc. She denies dizziness, syncope/ near syncope. MD to advise.    Brittainy M. Rosita Fire, PA-C 01/21/2016 8:21 AM As above, patient seen and examined. She denies dyspnea or chest pain. Her heart rate has improved.Continue bysystolic. Change cardizem to CD 120 mg daily; continue coumadin. Enzymes negative; no plans for further ischemia eval. DC today and fu with Dr Debara Pickett. > 30 min PA and physician time D2 Kirk Ruths

## 2016-01-24 ENCOUNTER — Encounter: Payer: Self-pay | Admitting: Neurology

## 2016-01-24 ENCOUNTER — Ambulatory Visit (INDEPENDENT_AMBULATORY_CARE_PROVIDER_SITE_OTHER): Payer: Medicare Other | Admitting: Neurology

## 2016-01-24 VITALS — BP 114/64 | HR 68 | Ht 63.0 in | Wt 266.0 lb

## 2016-01-24 DIAGNOSIS — M6249 Contracture of muscle, multiple sites: Secondary | ICD-10-CM | POA: Diagnosis not present

## 2016-01-24 DIAGNOSIS — G35 Multiple sclerosis: Secondary | ICD-10-CM

## 2016-01-24 DIAGNOSIS — M62838 Other muscle spasm: Secondary | ICD-10-CM

## 2016-01-24 MED ORDER — BACLOFEN 40 MG/20ML IT SOLN
80.0000 mg | Freq: Once | INTRATHECAL | Status: AC
  Administered 2016-01-24: 80 mg via INTRATHECAL

## 2016-01-24 NOTE — Progress Notes (Signed)
The [place] number is [phone] The baclofen expiration date is 07/19. The baclofen lot number is [id number].

## 2016-01-24 NOTE — Procedures (Signed)
     History:  Toni [name] is a 70 year old patient with a history of multiple sclerosis with spastic paraparesis. The patient has had some slight increase in spasticity in the evening hours between 7 AM and 1 AM. The patient otherwise has done well. She returns for baclofen pump refill.  Baclofen pump refill note  The baclofen pump site was cleaned with Betadine solution. A 21-gauge needle was inserted into the pump port site. Approximately 6.0 cc of residual baclofen was removed, the pump indicates that there is a 3.3 mL of residual. 40 cc of replacement baclofen was placed into the pump at 2000 mcg/cc concentration.  The pump was reprogrammed for the following settings: The basal rate remains at 28.0 mcg/h. Between 1 AM and 5 AM, the patient gets 139.9 g at a rate of 28 g per hour. Between 1500 hrs. and 2000 hours the patient gets a rate of 30 mcg/h, with a total of 150.2 g. Between 2000 hours and midnight, the patient gets a rate of 33.0 mcg/h, with a total dose of 131.9 g. all settings were kept the same with exception between 20:00 and midnight, the rate was increased from the basal rate of 28 micrograms per hour.  The alarm volume is set at 1.5 cc. The next alarm date is 05/12/2016.  The patient tolerated the procedure well. There were no complications of the above procedure.  The [place] number is [phone] The baclofen expiration date is July 2019. The baclofen lot number is [id number].

## 2016-01-25 ENCOUNTER — Telehealth: Payer: Self-pay | Admitting: Internal Medicine

## 2016-01-25 ENCOUNTER — Telehealth: Payer: Self-pay | Admitting: Neurology

## 2016-01-25 NOTE — Telephone Encounter (Signed)
Jack/[facility] [phone] requesting PT extension for 1 x wk x 9 wks. He is also requesting home health for bathing, etc for a few weeks.

## 2016-01-25 NOTE — Telephone Encounter (Signed)
Spoke to Dr. Brett Fairy. Received VO to extend PT and it is ok for [place] aide. I called Barnabas Lister, PT and relayed the verbal order.

## 2016-01-25 NOTE — Telephone Encounter (Signed)
New message     Pt was discharged from [facility] last Friday.  Her HR this am is 52.  She has not had any medication this am.  Should she still take her bystolic and diltiazem?

## 2016-01-25 NOTE — Telephone Encounter (Signed)
Spoke with patient and her heart rate on pulse ox today highest 62, been running in the 50's Blood pressure currently on blood pressure machine 119/60 HR 64 Last night heart rate 120 Patient has not had the Bystolic or Diltiazem today This am she was feeling a little nauseated and weak, feeling ok now Has follow up with Dr Debara Pickett on 02/03/16 Will forward to Dr Ellyn Hack for review

## 2016-01-27 DIAGNOSIS — N319 Neuromuscular dysfunction of bladder, unspecified: Secondary | ICD-10-CM | POA: Diagnosis not present

## 2016-01-27 DIAGNOSIS — I4891 Unspecified atrial fibrillation: Secondary | ICD-10-CM | POA: Diagnosis not present

## 2016-01-27 DIAGNOSIS — E039 Hypothyroidism, unspecified: Secondary | ICD-10-CM | POA: Diagnosis not present

## 2016-01-27 DIAGNOSIS — G35 Multiple sclerosis: Secondary | ICD-10-CM | POA: Diagnosis not present

## 2016-01-27 DIAGNOSIS — I10 Essential (primary) hypertension: Secondary | ICD-10-CM | POA: Diagnosis not present

## 2016-01-27 DIAGNOSIS — K592 Neurogenic bowel, not elsewhere classified: Secondary | ICD-10-CM | POA: Diagnosis not present

## 2016-01-27 NOTE — Telephone Encounter (Signed)
I would split up the Bystolic & Diltiazem - take one at night & one in the AM. Glenetta Hew, MD

## 2016-01-27 NOTE — Telephone Encounter (Signed)
SPOKE TO HUSBAND , DUE TO PATIENT STILL SLEEP INFORMATION GIVEN TO HUSBAND  TO SEPARATE TAKING MEDICATIONS VERBALIZED UNDERSTANDING AND STATES WILL TELL PATIENT.

## 2016-02-03 ENCOUNTER — Encounter: Payer: Self-pay | Admitting: Internal Medicine

## 2016-02-03 ENCOUNTER — Ambulatory Visit (INDEPENDENT_AMBULATORY_CARE_PROVIDER_SITE_OTHER): Payer: Medicare Other | Admitting: Pharmacist

## 2016-02-03 ENCOUNTER — Ambulatory Visit (INDEPENDENT_AMBULATORY_CARE_PROVIDER_SITE_OTHER): Payer: Medicare Other | Admitting: Internal Medicine

## 2016-02-03 VITALS — BP 112/76 | HR 84 | Ht 63.0 in | Wt 264.0 lb

## 2016-02-03 DIAGNOSIS — I4819 Other persistent atrial fibrillation: Secondary | ICD-10-CM

## 2016-02-03 DIAGNOSIS — I10 Essential (primary) hypertension: Secondary | ICD-10-CM | POA: Diagnosis not present

## 2016-02-03 DIAGNOSIS — N319 Neuromuscular dysfunction of bladder, unspecified: Secondary | ICD-10-CM | POA: Diagnosis not present

## 2016-02-03 DIAGNOSIS — G35 Multiple sclerosis: Secondary | ICD-10-CM

## 2016-02-03 DIAGNOSIS — Z7901 Long term (current) use of anticoagulants: Secondary | ICD-10-CM

## 2016-02-03 DIAGNOSIS — I481 Persistent atrial fibrillation: Secondary | ICD-10-CM

## 2016-02-03 DIAGNOSIS — R079 Chest pain, unspecified: Secondary | ICD-10-CM

## 2016-02-03 DIAGNOSIS — I4891 Unspecified atrial fibrillation: Secondary | ICD-10-CM | POA: Diagnosis not present

## 2016-02-03 DIAGNOSIS — K592 Neurogenic bowel, not elsewhere classified: Secondary | ICD-10-CM | POA: Diagnosis not present

## 2016-02-03 DIAGNOSIS — E039 Hypothyroidism, unspecified: Secondary | ICD-10-CM | POA: Diagnosis not present

## 2016-02-03 LAB — POCT INR: INR: 4

## 2016-02-03 NOTE — Progress Notes (Signed)
OFFICE NOTE  Chief Complaint:  Hospital follow-up  Primary Care Physician: [name], MD  HPI:  Toni [name] is a 70 year old female with multiple sclerosis, paroxysmal atrial fibrillation, on Coumadin, morbid obesity, obstructive sleep apnea, and dyslipidemia. She has had worsening swelling in her legs, thought to be due to an MS flare. She has a history of varicose veins with severe venous insufficiency in both the right and left greater saphenous veins. The right greater saphenous vein was actually successfully ablated by my partner, Dr. Elisabeth Cara, about 1 to 2 years ago. She does have residual reflux in the left greater saphenous vein extending from the proximal calf up to the saphenofemoral junction. The vein measured between 4 and 8 mm with between 1 and 6 seconds of reflux. We had discussed ablation of that before, but she has canceled several times at procedures. At this point, I think there is little benefit to continuing to try for her to undergo ablation. 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 She can wear work showed a compression stockings and this does seem to help her. With regards to her atrial fibrillation she has not had significant recurrence to her knowledge. She continues on warfarin and has been therapeutic, with her INR of 1.9 in the office by fingerstick today.  Toni [name] returns today for followup. She reports her MS is somewhat progressive. She continues to have some enlargement of her legs but no significant swelling. There is a mild varicosities in the right lower extremity. She is unaware of her atrial fibrillation. INR today was 2.3.   I saw Toni [name] back in the office today,  She is describing more shortness of breath and feels that she's been in A. Fib more recently. In fact today6/21her EKG does show persistent atrial fibrillation. In the past she's been paroxysmal , but it may be that she is more  frequently having A. Fib. She has trouble telling whether not she is more short of breath or symptomatic from A. Fib versus her multiple sclerosis. She is also reporting more swelling in her left leg.  Toni [name] returns for follow-up today. She recently saw her neurologist regarding her MS which seems to be flaring. In addition she was noted to have significant apneic events despite using CPAP. There is concern for possible central sleep apnea. Both of these episodes are probably contributing to her recurrent atrial fibrillation. She's been wearing a monitor for a period of time which indicates a burden of A. fib of about 80% of the time. It does not appear that she is in persistent A. fib, rather she has had some episodes of sinus rhythm, but they're much less frequent than her A. Fib.  I saw Mrs.  back today in the office. Recently I saw her as an add-on visit briefly as she was describing some chest pain when she was getting her INR checked. An EKG at that time showed no ischemia. I did not feel that this was cardiac. She continues intermittently have abnormal pains both in her chest as well as her abdomen and extremities. This may be related to MS. She's had difficulty with CPAP and apparently getting a good mask fit. She has been a persistent atrial fibrillation for about 6 months. It's unclear whether or not she symptomatic with this and therefore I have not pursued a rhythm control strategy. Her rate control is generally pretty good and she has been therapeutic on  warfarin. She is reporting worsening swelling of her legs although her weight she says is been stable. She has a history of venous insufficiency and had a right greater saphenous vein ablation by one of my partners. She's concerned about more swelling in her left leg and whether she's developed worsening venous insufficiency. She also reports cold feet with numbness in her extremities. As she is basically wheelchair bound, I cannot assess  whether she is having any claudication.  Toni [name] returns today for follow-up. She underwent venous and arterial lower external Dopplers. Arterial Dopplers were normal which showed no evidence of obstruction. Her venous Doppler showed no DVT. There was significant venous reflux in the left greater saphenous vein. This is an area where she has swelling and discomfort. This may be amenable to treatment. I increased her Lasix and she reports some improvement in her leg swelling. She's trying to elevate her feet and reports she is intolerant of wearing compression stockings.  02/03/2016  Toni [name] was seen today in the office for follow-up of recent hospitalization. She was seeing her neurologist and complained of left chest and left arm pain and was referred to the hospital. She ruled out for MI and was felt that her chest pain was on coronary. She underwent an echocardiogram which showed low-normal EF but was also found to be in A. fib with rapid ventricular response. Her A. fib is persistent at this time but the rate was elevated. She was started on Cardizem which helped improve her rate and was discharged on Cardizem CD 120 mg daily. Heart rate today is better controlled in the 70s. She reports her arm pain is improved and she said today that she "did not feel that it was her heart".  PMHx:  Past Medical History  Diagnosis Date  . Multiple sclerosis ([place])     has had this may 1989-[facility] reg doc  . Hypertension   . PAF (paroxysmal atrial fibrillation) ([place])     on coumadin; documented on monitor 06/2010  . Thyroid disease   . HLD (hyperlipidemia)   . Shortness of breath   . Edema     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' ablation of R GSV 2012  . Depression   . Anxiety   . Chronic pain     "nerve pain from the MS"  . Hx of echocardiogram 04/22/10    EF 55-60%, no valve issues  . History of stress test 06/21/10    limited exam with some degree of breast attenuatin of the  apex however a component of apical ischemia is not excluded, EF 62%; cardiac cath   . Abnormality of gait 03/01/2015  . DVT (deep venous thrombosis) ([place]) 1983    "RLE; may have just been phlebitis; it was before the age of dopplers"  . Atrial fibrillation with RVR ([place]) 01/19/2016  . Childhood asthma   . Pneumonia 11/2011  . OSA on CPAP   . Hypothyroidism   . Migraine     "none since I stopped beta blocker in 1990s" (01/19/2016)  . GERD (gastroesophageal reflux disease)   . Arthritis     "knees" (01/19/2016)    Past Surgical History  Procedure Laterality Date  . Anterior cervical decomp/discectomy fusion  01/2004  . Infusion pump implantation  ~ 2009    baclofen infusion in lower abd  . Venous ablation  12/16/2010    radiofreq ablation -Dr Elisabeth Cara and [facility]  . Child births  X2  . Cardiac catheterization  06/22/2010    normal coronary arteries, PAF  . Pain pump revision N/A 07/29/2014    Procedure: Baclofen pump replacement;  Surgeon: Erline Levine, MD;  Location: [facility];  Service: Neurosurgery;  Laterality: N/A;  Baclofen pump replacement  . Back surgery    . Dilation and curettage of uterus    . Cataract extraction, bilateral Bilateral   . Varicose vein surgery  ~ 1968  . Wisdom tooth extraction    . Tonsillectomy and adenoidectomy      FAMHx:  Family History  Problem Relation Age of Onset  . Coronary artery disease Father     at age 51  . Coronary artery disease Maternal Grandmother   . Depression Maternal Grandmother   . Cancer Paternal Grandmother   . Depression Mother   . Alcoholism Son     SOCHx:   reports that she quit smoking about 39 years ago. Her smoking use included Cigarettes. She has a 3.5 pack-year smoking history. She has never used smokeless tobacco. She reports that she drinks about 0.6 oz of alcohol per week. She reports that she does not use illicit drugs.  ALLERGIES:  Allergies  Allergen Reactions  . Hydrocodone Rash    Hydrocodone causes  vomiting  . Oxycodone     Oral oxycodone causes vomiting  . Chlorhexidine Gluconate     Sores at site  . Zoloft [Sertraline] Hives, Swelling and Rash    ROS: Pertinent items noted in HPI and remainder of comprehensive ROS otherwise negative.  HOME MEDS: Current Outpatient Prescriptions  Medication Sig Dispense Refill  . ALPRAZolam (XANAX) 0.5 MG tablet Take 1 tablet (0.5 mg total) by mouth 2 (two) times daily as needed for anxiety. 180 tablet 1  . baclofen (LIORESAL) 20 MG tablet Take 1 tablet (20 mg total) by mouth 3 (three) times daily as needed for muscle spasms. 180 each 3  . buPROPion (WELLBUTRIN XL) 300 MG 24 hr tablet Take 300 mg by mouth daily.    . cetirizine (ZYRTEC) 10 MG tablet Take 10 mg by mouth daily.    . Cholecalciferol (VITAMIN D) 2000 units tablet Take 2,000 Units by mouth 2 (two) times daily.    Marland Kitchen co-enzyme Q-10 30 MG capsule Take 30 mg by mouth daily.    . corticotropin (ATHACAR H.P.) 80 UNIT/ML injectable gel Inject 1 mL (80 Units total) into the skin every other day. To achieve control of relapsing MS Symptoms. (total of 5 injections per month) (Patient taking differently: Inject 80 Units into the skin See admin instructions. FIVE DAYS PER MONTH on 1st - To achieve control of relapsing MS Symptoms. (total of 5 injections per month)) 5 mL 6  . diltiazem (CARDIZEM CD) 120 MG 24 hr capsule Take 1 capsule (120 mg total) by mouth daily. 30 capsule 5  . fluticasone (FLONASE) 50 MCG/ACT nasal spray Place into both nostrils daily as needed for allergies. Use as needed for allergies    . furosemide (LASIX) 40 MG tablet Take 1.5 tablets (60 mg total) by mouth daily. Take 60 mg by mouth daily 135 tablet 3  . gabapentin (NEURONTIN) 300 MG capsule Take 1 capsule by mouth up to four times daily. 360 capsule 3  . lactulose, encephalopathy, (CHRONULAC) 10 GM/15ML SOLN Take 1 to 2 tablespoonfuls  daily as directed 946 mL o  . levothyroxine (SYNTHROID, LEVOTHROID) 75 MCG tablet Take 75  mcg by mouth daily.    . Multiple Minerals-Vitamins (CALCIUM &  VIT D3 BONE HEALTH PO) Take by mouth 2 (two) times daily. 1000/500mg     . Nebivolol HCl 20 MG TABS Take 20 mg by mouth daily.     . Omega-3 Fatty Acids (FISH OIL) 600 MG CAPS Take 600 mg by mouth daily.     Marland Kitchen omeprazole (PRILOSEC) 20 MG capsule Take 20 mg by mouth daily.     . ranitidine (ZANTAC) 150 MG tablet Take 150 mg by mouth at bedtime.     . simvastatin (ZOCOR) 20 MG tablet Take 20 mg by mouth at bedtime.     . SYRINGE/NEEDLE, DISP, 1 ML (BD LUER-LOK SYRINGE) 25G X 5/8" 1 ML MISC Use as directed to inject  Acthar 10 each prn  . Tamsulosin HCl (FLOMAX) 0.4 MG CAPS Take 0.4 mg by mouth daily.    Marland Kitchen warfarin (COUMADIN) 5 MG tablet Take 1 tablet by mouth daily or as directed by coumadin clinic. (Patient taking differently: Take 5 mg by mouth daily at 6 PM. Take 1 tablet by mouth daily or as directed by coumadin clinic.) 90 tablet 2   No current facility-administered medications for this visit.    LABS/IMAGING: No results found for this or any previous visit (from the past 48 hour(s)). No results found.  VITALS: There were no vitals taken for this visit.  EXAM: General appearance: alert, no distress and morbidly obese Lungs: clear to auscultation bilaterally Heart: regular rate and rhythm, S1, S2 normal, no murmur, click, rub or gallop Extremities: extremities normal, atraumatic, no cyanosis or edema Pulses: 2+ and symmetric Neurologic: Mental status: Alert, oriented, thought content appropriate, Wheelchair-bound, history of MS  EKG: Atrial fibrillation at 74  ASSESSMENT: 1. Persistent atrial fibrillation - 80% frequency of A. Fib (not clear if symptomatic) 2. Morbid obesity 3. Obstructive sleep apnea on CPAP 4. Multiple sclerosis 5. Chronic anticoagulation on warfarin - INR 2.3 today 6. Chronic venous insufficiency - +LGSV reflux 7. Hypertension-controlled 8. Noncardiac chest and left arm pain  PLAN: 1.   Mrs.  [name] ruled out for MI after presenting with what sounds like noncardiac left chest and arm pain. Fortunately her catheterization in 2012 showed no significant obstructive coronary disease. Her echo was reassuring without wall motion abnormality and low normal EF which may been related to rapid A. fib. Her heart rate is now better controlled with the addition of diltiazem. We'll continue that medication and no further coronary workup is planned at this time. Follow-up with me in 3 months.  Pixie Casino, MD, [facility] Attending Cardiologist Beecher C Ellanie Oppedisano 02/03/2016, 8:14 AM

## 2016-02-03 NOTE — Patient Instructions (Addendum)
Your physician recommends that you schedule a follow-up appointment in THREE MONTHS with Dr. Hilty.  

## 2016-02-04 ENCOUNTER — Telehealth: Payer: Self-pay | Admitting: Internal Medicine

## 2016-02-04 DIAGNOSIS — Z7901 Long term (current) use of anticoagulants: Secondary | ICD-10-CM | POA: Diagnosis not present

## 2016-02-04 DIAGNOSIS — E785 Hyperlipidemia, unspecified: Secondary | ICD-10-CM | POA: Diagnosis not present

## 2016-02-04 DIAGNOSIS — G35 Multiple sclerosis: Secondary | ICD-10-CM | POA: Diagnosis not present

## 2016-02-04 DIAGNOSIS — E039 Hypothyroidism, unspecified: Secondary | ICD-10-CM | POA: Diagnosis not present

## 2016-02-04 DIAGNOSIS — N319 Neuromuscular dysfunction of bladder, unspecified: Secondary | ICD-10-CM | POA: Diagnosis not present

## 2016-02-04 DIAGNOSIS — K592 Neurogenic bowel, not elsewhere classified: Secondary | ICD-10-CM | POA: Diagnosis not present

## 2016-02-04 DIAGNOSIS — I4891 Unspecified atrial fibrillation: Secondary | ICD-10-CM | POA: Diagnosis not present

## 2016-02-04 DIAGNOSIS — I10 Essential (primary) hypertension: Secondary | ICD-10-CM | POA: Diagnosis not present

## 2016-02-04 NOTE — Telephone Encounter (Signed)
New message      Pt c/o medication issue:  1. Name of Medication: Bystolic  2. How are you currently taking this medication (dosage and times per day)? 20 mg tablet daily  3. Are you having a reaction (difficulty breathing--STAT)? no  4. What is your medication issue? The pt told the tech yesterday wrong information on this medication this medication  did not change    Pt c/o medication issue:  1. Name of Medication: Simvastatin  2. How are you currently taking this medication (dosage and times per day)? 20 mg po the pt is taking half  3. Are you having a reaction (difficulty breathing--STAT)? No  4. What is your medication issue? This was the medication that had a change in the strength

## 2016-02-04 NOTE — Telephone Encounter (Signed)
Spoke to patient  She states she gave the incorrect information yesterday at her appointment She states she is taking Bystolic 20 mg the whole tablet daily RN repeated and confirmed with patient   change medication on medication lists

## 2016-02-07 ENCOUNTER — Telehealth: Payer: Self-pay | Admitting: Pharmacist Clinician (PhC)/ Clinical Pharmacy Specialist

## 2016-02-07 NOTE — Telephone Encounter (Signed)
New Message  Pt is calling to ask about a drug interaction between Wafarin and omeprazole (PRILOSEC) 20 MG capsule

## 2016-02-08 MED ORDER — WARFARIN SODIUM 5 MG PO TABS: ORAL_TABLET | ORAL | Status: DC

## 2016-02-08 NOTE — Telephone Encounter (Signed)
Returned call - assured patient that warfarin and omeprazole are fine.  Pt also needs refill sent to [facility].  Rx sent

## 2016-02-10 DIAGNOSIS — G35 Multiple sclerosis: Secondary | ICD-10-CM | POA: Diagnosis not present

## 2016-02-10 DIAGNOSIS — K592 Neurogenic bowel, not elsewhere classified: Secondary | ICD-10-CM | POA: Diagnosis not present

## 2016-02-10 DIAGNOSIS — N319 Neuromuscular dysfunction of bladder, unspecified: Secondary | ICD-10-CM | POA: Diagnosis not present

## 2016-02-10 DIAGNOSIS — I10 Essential (primary) hypertension: Secondary | ICD-10-CM | POA: Diagnosis not present

## 2016-02-10 DIAGNOSIS — I4891 Unspecified atrial fibrillation: Secondary | ICD-10-CM | POA: Diagnosis not present

## 2016-02-10 DIAGNOSIS — E039 Hypothyroidism, unspecified: Secondary | ICD-10-CM | POA: Diagnosis not present

## 2016-02-15 ENCOUNTER — Telehealth: Payer: Self-pay | Admitting: Neurology

## 2016-02-15 NOTE — Telephone Encounter (Signed)
Patient called to advise she is getting new mask for CPAP today, requests that we download what's in her CPAP up to this point.

## 2016-02-16 NOTE — Telephone Encounter (Signed)
I spoke to pt. She reports that she will be getting a new mask sometime this week. She wanted to know if we needed to download her cpap before she starts her new mask. I advised her that we don't need a new download-we have the one from May which documents the high air leak. Pt knows to bring her cpap chip in August and we will download it then to see how the new mask is working. Pt verbalized understanding.

## 2016-02-17 DIAGNOSIS — E039 Hypothyroidism, unspecified: Secondary | ICD-10-CM | POA: Diagnosis not present

## 2016-02-17 DIAGNOSIS — I10 Essential (primary) hypertension: Secondary | ICD-10-CM | POA: Diagnosis not present

## 2016-02-17 DIAGNOSIS — K592 Neurogenic bowel, not elsewhere classified: Secondary | ICD-10-CM | POA: Diagnosis not present

## 2016-02-17 DIAGNOSIS — G35 Multiple sclerosis: Secondary | ICD-10-CM | POA: Diagnosis not present

## 2016-02-17 DIAGNOSIS — I4891 Unspecified atrial fibrillation: Secondary | ICD-10-CM | POA: Diagnosis not present

## 2016-02-17 DIAGNOSIS — N319 Neuromuscular dysfunction of bladder, unspecified: Secondary | ICD-10-CM | POA: Diagnosis not present

## 2016-02-21 ENCOUNTER — Ambulatory Visit (INDEPENDENT_AMBULATORY_CARE_PROVIDER_SITE_OTHER): Payer: Medicare Other | Admitting: Pharmacist Clinician (PhC)/ Clinical Pharmacy Specialist

## 2016-02-21 DIAGNOSIS — I481 Persistent atrial fibrillation: Secondary | ICD-10-CM | POA: Diagnosis not present

## 2016-02-21 DIAGNOSIS — I4819 Other persistent atrial fibrillation: Secondary | ICD-10-CM

## 2016-02-21 DIAGNOSIS — Z7901 Long term (current) use of anticoagulants: Secondary | ICD-10-CM | POA: Diagnosis not present

## 2016-02-21 LAB — POCT INR: INR: 3.1

## 2016-02-28 ENCOUNTER — Telehealth: Payer: Self-pay | Admitting: Neurology

## 2016-02-28 DIAGNOSIS — I10 Essential (primary) hypertension: Secondary | ICD-10-CM | POA: Diagnosis not present

## 2016-02-28 DIAGNOSIS — I4891 Unspecified atrial fibrillation: Secondary | ICD-10-CM

## 2016-02-28 DIAGNOSIS — E039 Hypothyroidism, unspecified: Secondary | ICD-10-CM

## 2016-02-28 DIAGNOSIS — G35 Multiple sclerosis: Secondary | ICD-10-CM | POA: Diagnosis not present

## 2016-02-28 NOTE — Telephone Encounter (Signed)
Patient is calling with info per her conversation with Dr. Brett Fairy concerning where to purchase bed rails for the sleep lab.  2 businesses are [employer], [place] and [employer], [place].

## 2016-02-28 NOTE — Telephone Encounter (Signed)
Dear [name], this patient found bed rails that can be placed between mattress and bed stand and can easily be slept in and out. I wonder if this can help [name] in the sleep lab. Toni [name]

## 2016-03-02 DIAGNOSIS — K592 Neurogenic bowel, not elsewhere classified: Secondary | ICD-10-CM | POA: Diagnosis not present

## 2016-03-02 DIAGNOSIS — G35 Multiple sclerosis: Secondary | ICD-10-CM | POA: Diagnosis not present

## 2016-03-02 DIAGNOSIS — I10 Essential (primary) hypertension: Secondary | ICD-10-CM | POA: Diagnosis not present

## 2016-03-02 DIAGNOSIS — N319 Neuromuscular dysfunction of bladder, unspecified: Secondary | ICD-10-CM | POA: Diagnosis not present

## 2016-03-02 DIAGNOSIS — E039 Hypothyroidism, unspecified: Secondary | ICD-10-CM | POA: Diagnosis not present

## 2016-03-02 DIAGNOSIS — I4891 Unspecified atrial fibrillation: Secondary | ICD-10-CM | POA: Diagnosis not present

## 2016-03-09 DIAGNOSIS — I10 Essential (primary) hypertension: Secondary | ICD-10-CM | POA: Diagnosis not present

## 2016-03-09 DIAGNOSIS — G35 Multiple sclerosis: Secondary | ICD-10-CM | POA: Diagnosis not present

## 2016-03-09 DIAGNOSIS — I4891 Unspecified atrial fibrillation: Secondary | ICD-10-CM | POA: Diagnosis not present

## 2016-03-09 DIAGNOSIS — N319 Neuromuscular dysfunction of bladder, unspecified: Secondary | ICD-10-CM | POA: Diagnosis not present

## 2016-03-09 DIAGNOSIS — E039 Hypothyroidism, unspecified: Secondary | ICD-10-CM | POA: Diagnosis not present

## 2016-03-09 DIAGNOSIS — K592 Neurogenic bowel, not elsewhere classified: Secondary | ICD-10-CM | POA: Diagnosis not present

## 2016-03-10 ENCOUNTER — Telehealth: Payer: Self-pay | Admitting: Internal Medicine

## 2016-03-10 NOTE — Telephone Encounter (Signed)
Returned call to patient - INR to be checked Monday as scheduled. Advised that Augmentin usually does not affect INR so likely will not need to adjust dose. Instructed her to keep her appointment for Monday. Pt stated she understood and thanked me for returning call.

## 2016-03-10 NOTE — Telephone Encounter (Signed)
New message      Pt is on coumadin.  She was prescribed today amoxicillian/CLAV 875mg .  It is ok to take this with coumadin?  Please advise

## 2016-03-13 ENCOUNTER — Ambulatory Visit (INDEPENDENT_AMBULATORY_CARE_PROVIDER_SITE_OTHER): Payer: Medicare Other | Admitting: Pharmacist

## 2016-03-13 DIAGNOSIS — I481 Persistent atrial fibrillation: Secondary | ICD-10-CM

## 2016-03-13 DIAGNOSIS — Z7901 Long term (current) use of anticoagulants: Secondary | ICD-10-CM

## 2016-03-13 DIAGNOSIS — I4819 Other persistent atrial fibrillation: Secondary | ICD-10-CM

## 2016-03-13 LAB — POCT INR: INR: 2.9

## 2016-03-16 DIAGNOSIS — I4891 Unspecified atrial fibrillation: Secondary | ICD-10-CM | POA: Diagnosis not present

## 2016-03-16 DIAGNOSIS — G35 Multiple sclerosis: Secondary | ICD-10-CM | POA: Diagnosis not present

## 2016-03-16 DIAGNOSIS — K592 Neurogenic bowel, not elsewhere classified: Secondary | ICD-10-CM | POA: Diagnosis not present

## 2016-03-16 DIAGNOSIS — E039 Hypothyroidism, unspecified: Secondary | ICD-10-CM | POA: Diagnosis not present

## 2016-03-16 DIAGNOSIS — I10 Essential (primary) hypertension: Secondary | ICD-10-CM | POA: Diagnosis not present

## 2016-03-16 DIAGNOSIS — N319 Neuromuscular dysfunction of bladder, unspecified: Secondary | ICD-10-CM | POA: Diagnosis not present

## 2016-03-27 ENCOUNTER — Telehealth: Payer: Self-pay | Admitting: Neurology

## 2016-03-27 NOTE — Telephone Encounter (Signed)
[name] [phone] called said he needs approval for 1 x wk for 8-9 weeks.

## 2016-03-28 NOTE — Telephone Encounter (Signed)
I returned Toni [name]'s call, no answer, left a message asking him to call me back.

## 2016-03-28 NOTE — Telephone Encounter (Signed)
Catalina Pizza, PT/[facility] [phone] returned [name]'s call

## 2016-03-28 NOTE — Telephone Encounter (Signed)
I spoke to [place], [place]. I gave him verbal order per Dr. Brett Fairy for PT recertification 1x week for 8-9 weeks.

## 2016-03-29 DIAGNOSIS — H26493 Other secondary cataract, bilateral: Secondary | ICD-10-CM | POA: Diagnosis not present

## 2016-03-29 DIAGNOSIS — Z961 Presence of intraocular lens: Secondary | ICD-10-CM | POA: Diagnosis not present

## 2016-03-29 DIAGNOSIS — H04123 Dry eye syndrome of bilateral lacrimal glands: Secondary | ICD-10-CM | POA: Diagnosis not present

## 2016-03-30 DIAGNOSIS — G35 Multiple sclerosis: Secondary | ICD-10-CM | POA: Diagnosis not present

## 2016-03-30 DIAGNOSIS — I4891 Unspecified atrial fibrillation: Secondary | ICD-10-CM | POA: Diagnosis not present

## 2016-03-30 DIAGNOSIS — N319 Neuromuscular dysfunction of bladder, unspecified: Secondary | ICD-10-CM | POA: Diagnosis not present

## 2016-03-30 DIAGNOSIS — I10 Essential (primary) hypertension: Secondary | ICD-10-CM | POA: Diagnosis not present

## 2016-03-30 DIAGNOSIS — E039 Hypothyroidism, unspecified: Secondary | ICD-10-CM | POA: Diagnosis not present

## 2016-03-30 DIAGNOSIS — K592 Neurogenic bowel, not elsewhere classified: Secondary | ICD-10-CM | POA: Diagnosis not present

## 2016-04-04 ENCOUNTER — Other Ambulatory Visit: Payer: Self-pay | Admitting: Obstetrics and Gynecology

## 2016-04-04 DIAGNOSIS — E039 Hypothyroidism, unspecified: Secondary | ICD-10-CM | POA: Diagnosis not present

## 2016-04-04 DIAGNOSIS — Z1231 Encounter for screening mammogram for malignant neoplasm of breast: Secondary | ICD-10-CM

## 2016-04-04 DIAGNOSIS — I4891 Unspecified atrial fibrillation: Secondary | ICD-10-CM | POA: Diagnosis not present

## 2016-04-04 DIAGNOSIS — K592 Neurogenic bowel, not elsewhere classified: Secondary | ICD-10-CM | POA: Diagnosis not present

## 2016-04-04 DIAGNOSIS — E785 Hyperlipidemia, unspecified: Secondary | ICD-10-CM | POA: Diagnosis not present

## 2016-04-04 DIAGNOSIS — I10 Essential (primary) hypertension: Secondary | ICD-10-CM | POA: Diagnosis not present

## 2016-04-04 DIAGNOSIS — N319 Neuromuscular dysfunction of bladder, unspecified: Secondary | ICD-10-CM | POA: Diagnosis not present

## 2016-04-04 DIAGNOSIS — G35 Multiple sclerosis: Secondary | ICD-10-CM | POA: Diagnosis not present

## 2016-04-04 DIAGNOSIS — Z7901 Long term (current) use of anticoagulants: Secondary | ICD-10-CM | POA: Diagnosis not present

## 2016-04-06 DIAGNOSIS — I10 Essential (primary) hypertension: Secondary | ICD-10-CM | POA: Diagnosis not present

## 2016-04-06 DIAGNOSIS — N319 Neuromuscular dysfunction of bladder, unspecified: Secondary | ICD-10-CM | POA: Diagnosis not present

## 2016-04-06 DIAGNOSIS — I4891 Unspecified atrial fibrillation: Secondary | ICD-10-CM | POA: Diagnosis not present

## 2016-04-06 DIAGNOSIS — K592 Neurogenic bowel, not elsewhere classified: Secondary | ICD-10-CM | POA: Diagnosis not present

## 2016-04-06 DIAGNOSIS — E039 Hypothyroidism, unspecified: Secondary | ICD-10-CM | POA: Diagnosis not present

## 2016-04-06 DIAGNOSIS — G35 Multiple sclerosis: Secondary | ICD-10-CM | POA: Diagnosis not present

## 2016-04-10 ENCOUNTER — Ambulatory Visit (INDEPENDENT_AMBULATORY_CARE_PROVIDER_SITE_OTHER): Payer: Medicare Other | Admitting: Pharmacist

## 2016-04-10 DIAGNOSIS — Z7901 Long term (current) use of anticoagulants: Secondary | ICD-10-CM | POA: Diagnosis not present

## 2016-04-10 DIAGNOSIS — I481 Persistent atrial fibrillation: Secondary | ICD-10-CM

## 2016-04-10 DIAGNOSIS — I4819 Other persistent atrial fibrillation: Secondary | ICD-10-CM

## 2016-04-10 LAB — POCT INR: INR: 3.8

## 2016-04-13 ENCOUNTER — Telehealth: Payer: Self-pay | Admitting: Neurology

## 2016-04-13 DIAGNOSIS — N319 Neuromuscular dysfunction of bladder, unspecified: Secondary | ICD-10-CM | POA: Diagnosis not present

## 2016-04-13 DIAGNOSIS — E039 Hypothyroidism, unspecified: Secondary | ICD-10-CM | POA: Diagnosis not present

## 2016-04-13 DIAGNOSIS — G35 Multiple sclerosis: Secondary | ICD-10-CM | POA: Diagnosis not present

## 2016-04-13 DIAGNOSIS — I10 Essential (primary) hypertension: Secondary | ICD-10-CM | POA: Diagnosis not present

## 2016-04-13 DIAGNOSIS — I4891 Unspecified atrial fibrillation: Secondary | ICD-10-CM | POA: Diagnosis not present

## 2016-04-13 DIAGNOSIS — K592 Neurogenic bowel, not elsewhere classified: Secondary | ICD-10-CM | POA: Diagnosis not present

## 2016-04-13 NOTE — Telephone Encounter (Signed)
I spoke to pt. She reports that she is still taking achthar. She just missed the pharmacy's call but will call them back today for her refill.

## 2016-04-13 NOTE — Telephone Encounter (Signed)
[facility] [phone] called to inquire if patient is still taking corticotropin (ATHACAR H.P.) 80 UNIT/ML injectable gel, multiple attempts to reach patient unsuccessful. Requests that we reach out to patient and advise that they are ready to set up shipment for refill of this medication.

## 2016-04-18 ENCOUNTER — Ambulatory Visit: Payer: Medicare Other

## 2016-04-20 ENCOUNTER — Encounter: Payer: Self-pay | Admitting: Neurology

## 2016-04-20 ENCOUNTER — Ambulatory Visit (INDEPENDENT_AMBULATORY_CARE_PROVIDER_SITE_OTHER): Payer: Medicare Other | Admitting: Neurology

## 2016-04-20 VITALS — BP 136/86 | HR 68 | Resp 20

## 2016-04-20 DIAGNOSIS — G4737 Central sleep apnea in conditions classified elsewhere: Secondary | ICD-10-CM

## 2016-04-20 DIAGNOSIS — Z79899 Other long term (current) drug therapy: Secondary | ICD-10-CM

## 2016-04-20 DIAGNOSIS — G35D Multiple sclerosis, unspecified: Secondary | ICD-10-CM

## 2016-04-20 DIAGNOSIS — G35 Multiple sclerosis: Secondary | ICD-10-CM

## 2016-04-20 DIAGNOSIS — I4891 Unspecified atrial fibrillation: Secondary | ICD-10-CM | POA: Diagnosis not present

## 2016-04-20 DIAGNOSIS — I509 Heart failure, unspecified: Secondary | ICD-10-CM | POA: Diagnosis not present

## 2016-04-20 MED ORDER — ALPRAZOLAM 0.5 MG PO TABS: 0.5000 mg | ORAL_TABLET | Freq: Two times a day (BID) | ORAL | 1 refills | Status: DC | PRN

## 2016-04-20 MED ORDER — BACLOFEN 20 MG PO TABS: 20.0000 mg | ORAL_TABLET | Freq: Three times a day (TID) | ORAL | 3 refills | Status: DC | PRN

## 2016-04-20 NOTE — Patient Instructions (Signed)
CPAP and BIPAP Information CPAP and BIPAP are methods of helping you breathe with the use of air pressure. CPAP stands for "continuous positive airway pressure." BIPAP stands for "bi-level positive airway pressure." In both methods, air is blown into your air passages to help keep you breathing well. With CPAP, the amount of pressure stays the same while you breathe in and out. CPAP is most commonly used for obstructive sleep apnea. For obstructive sleep apnea, CPAP works by holding your airways open so that they do not collapse when your muscles relax during sleep. BIPAP is similar to CPAP except the amount of pressure is increased when you inhale. This helps you take larger breaths. Your health care provider will recommend whether CPAP or BIPAP would be more helpful for you.  WHY ARE CPAP AND BIPAP TREATMENTS USED? CPAP or BIPAP can be helpful if you have:   Sleep apnea.   Chronic obstructive pulmonary disease (COPD).   Diseases that weaken the muscles of the chest, including muscular dystrophy or neurological diseases such as amyotrophic lateral sclerosis (ALS).  Other problems that cause breathing to be weak, abnormal, or difficult.  HOW IS CPAP OR BIPAP ADMINISTERED? Both CPAP and BIPAP are provided by a small machine with a flexible plastic tube that attaches to a plastic mask. The mask fits on your face, and air is blown into your air passages through your nose or mouth. The amount of pressure that is used to blow the air into your air passages can be set on the machine. Your health care provider will determine the pressure setting that should be used based on your individual needs. WHEN SHOULD CPAP OR BIPAP BE USED? In most cases, the mask is worn only when sleeping. Generally, you will need to wear the mask throughout the night and during the daytime if you take a nap. In a few cases involving certain medical conditions, people also need to wear the mask at other times when they are awake.  Follow your health care provider's instructions for when to use the machine.  USING THE MASK  Because the mask needs to be snug, some people feel a trapped or closed-in feeling (claustrophobic) when first using the mask. You may need to get used to the mask gradually. To do this, you can first hold the mask loosely over your nose or mouth. Gradually apply the mask more snugly. You can also gradually increase the amount of time that you use the mask.  Masks are available in various types and sizes. Some fit over your mouth and nose, and some fit over just your nose. If your mask does not fit well, talk to your health care provider about getting a different one.  If you are using a nasal mask and you tend to breathe through your mouth, a chin strap may be applied to help keep your mouth closed.   The CPAP and BIPAP machines have alarms that may sound if the mask comes off or develops a leak.  If you have trouble with the mask, it is very important that you talk to your health care provider about finding a way to make the mask easier to tolerate. Do not stop using the mask. This could have a negative impact on your health. TIPS FOR USING THE MACHINE  Place your CPAP or BIPAP machine on a secure table or stand near an electrical outlet.   Know where the on-off switch is located on the machine.  Follow your health care provider's   instructions for how to set the pressure on your machine and when you should use it.  Do not eat or drink while the CPAP or BIPAP machine is on. Food or fluids could get pushed into your lungs by the pressure of the CPAP or BIPAP.  Do not smoke. Tobacco smoke residue can damage the machine.   For home use, CPAP and BIPAP machines can be rented or purchased through home health care companies. Many different brands of machines are available. Renting a machine before purchasing may help you find out which particular machine works well for you. SEEK IMMEDIATE MEDICAL CARE  IF:  You have redness or open areas around your nose or mouth where the mask fits.   You have trouble operating the CPAP or BIPAP machine.   You cannot tolerate wearing the CPAP or BIPAP mask.    This information is not intended to replace advice given to you by your health care provider. Make sure you discuss any questions you have with your health care provider.   Document Released: 05/05/2004 Document Revised: 08/28/2014 Document Reviewed: 03/06/2013 Elsevier Interactive Patient Education 2016 [employer]  

## 2016-04-20 NOTE — Progress Notes (Signed)
Provider:  [name], M D  Referring Provider: Prince Solian, MD Primary Care Physician:  [name], MD  No chief complaint on file.   HPI:  Toni [name] is a 70 y.o. female seen here as a revisit for MS follow up.   The patient has had her baclofen pump replaced in December 2015 and March 2016 . The pump refill went well.  Before the pump was replaced, she had noted a higher and higher need for oral baclofen supplement. She uses ACTH to avoid the previously steroid insuced a fib , she has less weight gain and less HTN on the ACTH. Her right leg is weaker than last year, but she has not fallen. She is her for a routine visit , has no acute questions.  Her extensive history is re viewed in previous noted.   01-25-15 Is until is here for routine visit today and she brought me a beautiful Gardenia blossom. Her sleepiness score is endorsed at 8 points fatigue severity 34, we have today also a download from her [employer] auto system. Unfortunately the data very concerning. By the patient is compliant with 4 hours and 25 minutes of daily use and 96.7% compliance 29 out of 30 days. She has had a mean pressure of 12 cm water peak pressure of 14 cm water but an average AHI of 37.9.  MS; Toni [name]'s fine motor skills and ambulatory skills have significantly declined over the last 5 years - she is now mostly seen here in a wheelchair, but she can transfer. She continues to use the baclofen pump which is followed by Dr. [name]. Cognitive decline was a concern but 3 sequenced  Montral cognitive assessment tests showed 30 and 29 out of 30 points and word fluency of 13 words.  She also endorsed the Epworth sleepiness score at 11 points and the fatigue severity score was not endorsed. Odema: severe ankle edema and veinous insufficiency. On diuretics. Not using compression stockings.  She has anasarca when she wakes first in the morning. She has low albumin. She takes a protein rich  diet. She is losing muscle mass.  Osteopenia. Arthritis. once a week for mobility maintenance. I will renew her prescription.   Interval history from the 31-05 2017.Toni [name] is here today for a revisit. This included a Montral cognitive assessment test which she aced with 29 out of 30 points.  She endorsed the geriatric depression score at 5 points the fatigue severity score at 24 points, and the Epworth score at 14 points.She reports that she has a feeling her CPAP does not pick up flow when she holds her breath something that her old machine would have done. She was given this replacement by [facility] was originally set up with [facility]. Her new machine however seems not to give her the airflow she craves. In interrogation into the machine today was not possible as the memory chip was supposedly "corrupted".  Toni [name] fields more unsteady when she transfers now, it feels a little bit like vertigo to her without being a classic vertigo. She also has continued to feel burning in her left hand especially. She reports that she feels she has no  "Oomph".  Her atrial fibrillation has become chronic and is no longer paroxysmal, this may also take some of her energy away. Her ejection fraction may be significantly impaired. She continues to have ankle edema. 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 Baclofen  he still helps with the spasticity but also can leave her probably, gabapentin 4 tablets a day one of the morning one at lunch 2 at night helped with a burning sensation of the hand but has not alleviated his completely. Overall her download on his CPAP also shows a very high residual AHI of 38. I am concerned that this is an artifact related to high air leaks.  Her average device pressure is 13.6 cm water.   Interval history from 04/20/2016. Toni [name] is here today reporting that she tried a new interface for a CPAP but had to revert back to her previous nasal  pillow with chinstrap. She has been using the new interface regularly and I'm awaiting at data download. She reports waking up every 3 hours or so from sleep she has used the device 15 9 of the last 60 days with high compliance of 98.3% her average user time is 4 hours and 36 minutes. Surprisingly her average residual AHI is 31.7. The vast majority of her apnea is central in nature now and no longer obstructive. She had about 20 central apneas for every 10 obstructive. I do not think that a traditional CPAP can improve. Dr. Debara Pickett has explained to her that she does not have congestive heart failure. I wonder if the central apneas are related to a degenerative CNS disease, renal or liver function. She will see Dr. on Tuesday I will ask him to provide me with a comprehensive metabolic panel and additional ideas. I suspect that baclofen induces her  central apneas, too.    Atrial fibrillation unaffected by CPAP use. Toni [name] also says that she noted that during micturition she tends to hold her breath without knowing it or doing it on purpose. She has not fainted by relieving herself but she has noted a concerted effort as needed to continue breathing. Her neurogenic bladder also requested to relax take a deep breath and actually concentrate on urination. She also falls asleep more often now especially while watching TV. She's not even aware that she missed how to show until is over..   Excellent cognition. Montreal Cognitive Assessment  10/04/2015 06/02/2015 03/30/2015  Visuospatial/ Executive (0/5) 4 5 5   Naming (0/3) 3 3 3   Attention: Read list of digits (0/2) 2 2 2   Attention: Read list of letters (0/1) 1 1 1   Attention: Serial 7 subtraction starting at 100 (0/3) 3 3 3   Language: Repeat phrase (0/2) 2 2 2   Language : Fluency (0/1) 1 1 1   Abstraction (0/2) 2 2 2   Delayed Recall (0/5) 5 5 5   Orientation (0/6) 6 6 6   Total 29 30 30   Adjusted Score (based on education) 29 30 30         Review of  Systems: Out of a complete 14 system review, the patient complains of only the following symptoms, and all other reviewed systems are negative. Wheelchair bound.  Now back at home after rehab with PT> carpal tunnel.  Epworth 8 and FSS 34.She has abdominal swelling and she feels like baclofen or the baclofen pump may contribute to some of these abdominal tightness and sweating feeling.  The 90th percentile pressure is 14 cm water with an average central apnea index of 20.3 and obstructive apnea residual index of 9.5 very few hypopneas are noted. The average airleak right now is only between 4 and 5 minutes each night or 1.5% of the total night. I don't think that air leakage contributes to the high residual  AHI. I will need to place her on BIpAP or ASV.   Social History   Social History  . Marital status: Married    Spouse name: N/A  . Number of children: 2  . Years of education: COLLEGE   Occupational History  . RETIRED    Social History Main Topics  . Smoking status: Former Smoker    Packs/day: 0.50    Years: 7.00    Types: Cigarettes    Quit date: 08/21/1976  . Smokeless tobacco: Never Used  . Alcohol use 0.6 oz/week    1 Glasses of wine per week  . Drug use: No  . Sexual activity: Not Currently   Other Topics Concern  . Not on file   Social History Narrative   Lives in [place] with Tillman Sers [id number], [phone]          Family History  Problem Relation Age of Onset  . Coronary artery disease Father     at age 109  . Coronary artery disease Maternal Grandmother   . Depression Maternal Grandmother   . Cancer Paternal Grandmother   . Depression Mother   . Alcoholism Son     Past Medical History:  Diagnosis Date  . Abnormality of gait 03/01/2015  . Anxiety   . Arthritis    "knees" (01/19/2016)  . Atrial fibrillation with RVR ([place]) 01/19/2016  . Childhood asthma   . Chronic pain    "nerve pain from the MS"  . Depression   . DVT (deep venous thrombosis) ([place]) 1983     "RLE; may have just been phlebitis; it was before the age of dopplers"  . 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 GERD (gastroesophageal reflux disease)   . History of stress test 06/21/10   limited exam with some degree of breast attenuatin of the apex however a component of apical ischemia is not excluded, EF 62%; cardiac cath   . HLD (hyperlipidemia)   . Hx of echocardiogram 04/22/10   EF 55-60%, no valve issues  . Hypertension   . Hypothyroidism   . Migraine    "none since I stopped beta blocker in 1990s" (01/19/2016)  . Multiple sclerosis ([name])    has had this may 1989-[facility] reg doc  . OSA on CPAP   . PAF (paroxysmal atrial fibrillation) ([place])    on coumadin; documented on monitor 06/2010  . Pneumonia 11/2011  . Shortness of breath   . Thyroid disease     Current Outpatient Prescriptions  Medication Sig Dispense Refill  . ALPRAZolam (XANAX) 0.5 MG tablet Take 1 tablet (0.5 mg total) by mouth 2 (two) times daily as needed for anxiety. 180 tablet 1  . baclofen (LIORESAL) 20 MG tablet Take 1 tablet (20 mg total) by mouth 3 (three) times daily as needed for muscle spasms. 180 each 3  . buPROPion (WELLBUTRIN XL) 300 MG 24 hr tablet Take 300 mg by mouth daily.    . cetirizine (ZYRTEC) 10 MG tablet Take 10 mg by mouth daily.    . Cholecalciferol (VITAMIN D) 2000 units tablet Take 2,000 Units by mouth 2 (two) times daily.    Marland Kitchen co-enzyme Q-10 30 MG capsule Take 30 mg by mouth daily.    . corticotropin (ATHACAR H.P.) 80 UNIT/ML injectable gel Inject 1 mL (80 Units total) into the skin every other day. To achieve control of relapsing MS Symptoms. (total of 5 injections  per month) (Patient taking differently: Inject 80 Units into the skin See admin instructions. FIVE DAYS PER MONTH on 1st - To achieve control of relapsing MS Symptoms. (total of 5 injections per month)) 5 mL 6  . diltiazem (CARDIZEM CD) 120 MG 24 hr capsule Take 1 capsule  (120 mg total) by mouth daily. 30 capsule 5  . fluticasone (FLONASE) 50 MCG/ACT nasal spray Place into both nostrils daily as needed for allergies. Use as needed for allergies    . furosemide (LASIX) 40 MG tablet Take 1.5 tablets (60 mg total) by mouth daily. Take 60 mg by mouth daily 135 tablet 3  . gabapentin (NEURONTIN) 300 MG capsule Take 1 capsule by mouth up to four times daily. 360 capsule 3  . lactulose, encephalopathy, (CHRONULAC) 10 GM/15ML SOLN Take 1 to 2 tablespoonfuls  daily as directed 946 mL o  . levothyroxine (SYNTHROID, LEVOTHROID) 75 MCG tablet Take 75 mcg by mouth daily.    . Multiple Minerals-Vitamins (CALCIUM & VIT D3 BONE HEALTH PO) Take by mouth 2 (two) times daily. 1000/500mg     . Nebivolol HCl (BYSTOLIC) 20 MG TABS Take 20 mg by mouth daily.    . Omega-3 Fatty Acids (FISH OIL) 600 MG CAPS Take 600 mg by mouth daily.     Marland Kitchen omeprazole (PRILOSEC) 20 MG capsule Take 20 mg by mouth daily.     . ranitidine (ZANTAC) 150 MG tablet Take 150 mg by mouth at bedtime.     . simvastatin (ZOCOR) 20 MG tablet Take 10 mg by mouth at bedtime.     . SYRINGE/NEEDLE, DISP, 1 ML (BD LUER-LOK SYRINGE) 25G X 5/8" 1 ML MISC Use as directed to inject  Acthar 10 each prn  . Tamsulosin HCl (FLOMAX) 0.4 MG CAPS Take 0.4 mg by mouth daily.    Marland Kitchen warfarin (COUMADIN) 5 MG tablet Take 1 tablet by mouth daily or as directed by coumadin clinic. 90 tablet 2   No current facility-administered medications for this visit.     Allergies as of 04/20/2016 - Review Complete 02/03/2016  Allergen Reaction Noted  . Hydrocodone Rash 11/26/2011  . Oxycodone  05/12/2013  . Chlorhexidine gluconate  11/26/2011  . Zoloft [sertraline] Hives, Swelling, and Rash 05/15/2013    Vitals: There were no vitals taken for this visit. Last Weight:  Wt Readings from Last 1 Encounters:  02/03/16 264 lb (119.7 kg)   Last Height:   Ht Readings from Last 1 Encounters:  02/03/16 5\' 3"  (1.6 m)    Physical exam:   Height  ;  5.4 ", 260 pounds,   General: The patient is awake, alert and appears not in acute distress. The patient is well groomed.  Head: Normocephalic, atraumatic. Neck is supple.  Mallampati 4 , with lateral narrowing. neck circumference:19. 25  inches .  Cardiovascular: Regular rate and rhythm today, without murmurs or carotid bruit, and without distended neck veins.  Respiratory: Lungs are clear to auscultation.  Skin: evidence of edema, not Rash, Injection site reaction. Thighs are bruised.   Trunk: BMI is elevated. Neurologic exam : The patient is awake and alert, oriented to place and time.  Memory subjective described as intact. There is a normal attention span & concentration ability.  Speech without dysarthria, dysphonia , delayed wordfinding- aphasia.  Mood and affect are appropriate.   Cranial nerves:  Pupils are equal and briskly reactive to light. Extraocular movements in vertical and horizontal planes intact and without nystagmus. Visual fields by finger perimetry are  intact.  Hearing to finger rub intact. Facial sensation intact to fine touch.  Facial motor strength is symmetric and tongue and uvula move midline.   Deep tendon reflexes: in the upper and lower extremities are attenuated, right Babinski positive- up going.   ASSESSMENT AND PLAN  70 y.o. year old female Patient with secondary progressive  multiple sclerosis; Hypertension; PAF (paroxysmal atrial fibrillation); Thyroid disease; obesity hypoventilation,   Pneumonia; Sleep apnea; Hyperthyroidism; Edema;   Acthar re -established. Causes bruising easliy but keeps her from relapses, and she is less fatigued. She reports memory lapses, but recalled all 5 words on MOCA. PT to continue. CPAP to be checked by Jonni Sanger, [place] normal.   PLAN :  Continue ACTHAR 2 days a week - she has atrial fibrillation again, concerned that she needs an ablation. Cardiologist will not do ablation until Apnea is improved.  Acthar is less likely  contributing to a -fib, as did her steroid pulse.   The PT has noted improvement while on ACTHAR. She reports better sleep, better bowl control and bladder control. I like her to continue on this medication.  5 in a row - Can be used each month, [facility]  Continues with nasal pillows, chin strap for oral air leakage.  DME [facility]-  please allow her to change to a an auto set CPAP and let's try a Dream wear.   Here with for routine followup of Secondary Progressive MS , with Spasticity on baclofen. Continues Baclofen PUMP follow up  with Dr [name].  CSA/ complex apneas.  - on CPAP. Uses Flonase . The patient uses in a flex  machine with a minimum pressure of 10 and a maximum pressure of 16 cm water. She does report feeling a lot of air leakage and her nasal pillows cannot be tightened to the level where she feels a secure fit.  Her 90 percentile pressure is 13.6 cm water but her average AHI is 38 per hour the average time and large leaks per day is 51 seconds.  Average user time all day for 3 hours and 40 minutes,   she states she had a sinus infection and therefore was several nights not able to use CPAP. However she uses CPAP 27 days out of 30 days. I'm very concerned about the very high average AHI I think that a new interface should be tried we will try this time at dream ware mask the patient assured me that she has no trouble reaching the crown of the head I think that  this interface will give her better more snug fit.  I will order a BiPAP titration, auto .    RV in 2-3   [employer] Myleen Brailsford MD  04/20/2016

## 2016-04-20 NOTE — Addendum Note (Signed)
Addended by: [name] on: 04/20/2016 04:42 PM   Modules accepted: Orders

## 2016-04-25 DIAGNOSIS — R600 Localized edema: Secondary | ICD-10-CM | POA: Diagnosis not present

## 2016-04-25 DIAGNOSIS — R7301 Impaired fasting glucose: Secondary | ICD-10-CM | POA: Diagnosis not present

## 2016-04-25 DIAGNOSIS — G35 Multiple sclerosis: Secondary | ICD-10-CM | POA: Diagnosis not present

## 2016-04-25 DIAGNOSIS — Z6841 Body Mass Index (BMI) 40.0 and over, adult: Secondary | ICD-10-CM | POA: Diagnosis not present

## 2016-04-25 DIAGNOSIS — J3089 Other allergic rhinitis: Secondary | ICD-10-CM | POA: Diagnosis not present

## 2016-04-25 DIAGNOSIS — G4733 Obstructive sleep apnea (adult) (pediatric): Secondary | ICD-10-CM | POA: Diagnosis not present

## 2016-04-25 DIAGNOSIS — I480 Paroxysmal atrial fibrillation: Secondary | ICD-10-CM | POA: Diagnosis not present

## 2016-04-25 DIAGNOSIS — R601 Generalized edema: Secondary | ICD-10-CM | POA: Diagnosis not present

## 2016-04-25 DIAGNOSIS — I10 Essential (primary) hypertension: Secondary | ICD-10-CM | POA: Diagnosis not present

## 2016-04-25 DIAGNOSIS — E038 Other specified hypothyroidism: Secondary | ICD-10-CM | POA: Diagnosis not present

## 2016-04-26 ENCOUNTER — Telehealth: Payer: Self-pay | Admitting: Neurology

## 2016-04-26 NOTE — Telephone Encounter (Signed)
Patient called, states she is having MS exacerbation, please call [phone].

## 2016-04-26 NOTE — Telephone Encounter (Signed)
I have spoken with Toni [name] this afternoon.  She was very pleasant, but declined to go into detail with me.  Sts. she sees Dr. [name] for mx. of her Baclofen pump and would like to request a phone call back from him to discuss./fim

## 2016-04-26 NOTE — Telephone Encounter (Signed)
I called patient. The patient has had issues over the last 4 days or so. She has had increased fatigue, stiffness in the legs, burning sensations in the hands and legs, in the mouth. She has had a generalized increase in weakness, difficulty with transfers.  The patient denies any fevers, chills, evidence of a bladder infection. She was at her primary doctor yesterday had an evaluation there.  The patient will start a five-day course of ACTHAR, 80 units a day. I will see her next week for a baclofen pump refill.

## 2016-04-27 ENCOUNTER — Telehealth: Payer: Self-pay

## 2016-04-27 DIAGNOSIS — K592 Neurogenic bowel, not elsewhere classified: Secondary | ICD-10-CM | POA: Diagnosis not present

## 2016-04-27 DIAGNOSIS — N319 Neuromuscular dysfunction of bladder, unspecified: Secondary | ICD-10-CM | POA: Diagnosis not present

## 2016-04-27 DIAGNOSIS — E039 Hypothyroidism, unspecified: Secondary | ICD-10-CM | POA: Diagnosis not present

## 2016-04-27 DIAGNOSIS — I4891 Unspecified atrial fibrillation: Secondary | ICD-10-CM | POA: Diagnosis not present

## 2016-04-27 DIAGNOSIS — I10 Essential (primary) hypertension: Secondary | ICD-10-CM | POA: Diagnosis not present

## 2016-04-27 DIAGNOSIS — G35 Multiple sclerosis: Secondary | ICD-10-CM | POA: Diagnosis not present

## 2016-04-27 NOTE — Telephone Encounter (Signed)
Please see below. Patient's insurance will not approve Auto-BiPAP without titration study. How do you want to proceed?

## 2016-04-27 NOTE — Telephone Encounter (Signed)
-----   Message from Patsy Baltimore sent at 04/26/2016  2:44 PM EDT ----- Another one, thanks.  Angie  ----- Message ----- From: Patsy Baltimore Sent: 04/24/2016  12:30 PM To: Patsy Baltimore, Leeroy Bock, #  Vickie Epley, This referral has been reviewed and we have spoken to the pt. She currently has a cpap machine not a bipap. In order to have an auto bipap covered through her insurance she would need to have a cpap titration sleep study done. I didn't see one in her chart; has she had this?   Thanks, Angie ----- Message ----- From: Patsy Baltimore Sent: 04/21/2016   1:04 PM To: Montel Culver, RN  Order sent. Thanks.  ----- Message ----- From: Lester Parkman, RN Sent: 04/20/2016   5:02 PM To: Patsy Baltimore  New order in!

## 2016-04-28 ENCOUNTER — Telehealth: Payer: Self-pay

## 2016-04-28 ENCOUNTER — Telehealth: Payer: Self-pay | Admitting: *Deleted

## 2016-04-28 DIAGNOSIS — I509 Heart failure, unspecified: Secondary | ICD-10-CM

## 2016-04-28 DIAGNOSIS — G35 Multiple sclerosis: Secondary | ICD-10-CM

## 2016-04-28 DIAGNOSIS — Z9989 Dependence on other enabling machines and devices: Principal | ICD-10-CM

## 2016-04-28 DIAGNOSIS — G4737 Central sleep apnea in conditions classified elsewhere: Secondary | ICD-10-CM

## 2016-04-28 DIAGNOSIS — G4733 Obstructive sleep apnea (adult) (pediatric): Secondary | ICD-10-CM

## 2016-04-28 MED ORDER — CORTICOTROPIN 80 UNIT/ML IJ GEL: 80.0000 [IU] | INTRAMUSCULAR | 6 refills | Status: DC

## 2016-04-28 NOTE — Telephone Encounter (Signed)
Patient called and wanted to know more about Insurance denial of Bipap titration. I explained that she would need another cpap titration to show her centrals and we could change it to Bipap during the night. Insurance will not ake her [employer] for [employer]. Can you order another cpap tration/Bipap? She is aware she will have to come in for study.

## 2016-04-28 NOTE — Telephone Encounter (Signed)
Acthar rx. faxed to [facility] per faxed request/fim

## 2016-05-01 ENCOUNTER — Telehealth: Payer: Self-pay | Admitting: Neurology

## 2016-05-01 ENCOUNTER — Other Ambulatory Visit: Payer: Self-pay | Admitting: Neurology

## 2016-05-01 ENCOUNTER — Telehealth: Payer: Self-pay | Admitting: Pharmacist

## 2016-05-01 NOTE — Telephone Encounter (Signed)
Pt calling about which NSAID she could use. States used to take Ibuprofen before starting Coumadin.  Informed her that we would prefer she use tylenol, but she has had little to no relief with her back pain.   Instructed that we preferred topical agents over NSAIDs, but if absolutely necessary she could take a dose or two to get through pain. Told her to use as sparingly as possible.   She reports she understands bleeding risk and cardiovascular risk. She states she appreciates help and will call with any other issues.

## 2016-05-01 NOTE — Telephone Encounter (Signed)
I spoke to pt. She is agreeable to having the RXs for xanax and baclofen reprinted on Wednesday when Dr. Brett Fairy comes back and then faxed to [facility]. Pt verbalized understanding.

## 2016-05-01 NOTE — Telephone Encounter (Signed)
Pt said baclofen (LIORESAL) 20 MG tablet and ALPRAZolam (XANAX) 0.5 MG tablet should have been faxed to [facility] but she just noticed she has the RX's  with her. Please fax

## 2016-05-03 ENCOUNTER — Telehealth: Payer: Self-pay | Admitting: Neurology

## 2016-05-03 NOTE — Telephone Encounter (Signed)
Pt called in requesting to come in earlier than Friday. She says she is having some MS issues and is not sure if she would be able to get up on the table if she waits. Please call [phone]

## 2016-05-03 NOTE — Telephone Encounter (Signed)
Called pt back and offered 10:30 appt this morning but she says that she wouldn't be able to make it. Will keep Friday's appt as scheduled but can call her if any cancellations.

## 2016-05-03 NOTE — Telephone Encounter (Signed)
Called pt again and sooner appt scheduled for tomorrow at noon. Pt agreed to arrive 15 min prior to appt time and voiced appreciation for call.

## 2016-05-04 ENCOUNTER — Encounter: Payer: Self-pay | Admitting: Neurology

## 2016-05-04 ENCOUNTER — Ambulatory Visit (INDEPENDENT_AMBULATORY_CARE_PROVIDER_SITE_OTHER): Payer: Medicare Other | Admitting: Neurology

## 2016-05-04 VITALS — BP 118/76 | HR 80 | Ht 63.0 in | Wt 264.0 lb

## 2016-05-04 DIAGNOSIS — G35 Multiple sclerosis: Secondary | ICD-10-CM | POA: Diagnosis not present

## 2016-05-04 DIAGNOSIS — I10 Essential (primary) hypertension: Secondary | ICD-10-CM | POA: Diagnosis not present

## 2016-05-04 DIAGNOSIS — K592 Neurogenic bowel, not elsewhere classified: Secondary | ICD-10-CM | POA: Diagnosis not present

## 2016-05-04 DIAGNOSIS — E039 Hypothyroidism, unspecified: Secondary | ICD-10-CM | POA: Diagnosis not present

## 2016-05-04 DIAGNOSIS — M6249 Contracture of muscle, multiple sites: Secondary | ICD-10-CM | POA: Diagnosis not present

## 2016-05-04 DIAGNOSIS — M62838 Other muscle spasm: Secondary | ICD-10-CM

## 2016-05-04 DIAGNOSIS — I4891 Unspecified atrial fibrillation: Secondary | ICD-10-CM | POA: Diagnosis not present

## 2016-05-04 DIAGNOSIS — N319 Neuromuscular dysfunction of bladder, unspecified: Secondary | ICD-10-CM | POA: Diagnosis not present

## 2016-05-04 MED ORDER — ALPRAZOLAM 0.5 MG PO TABS: 0.5000 mg | ORAL_TABLET | Freq: Two times a day (BID) | ORAL | 1 refills | Status: DC | PRN

## 2016-05-04 MED ORDER — BACLOFEN 20 MG PO TABS: 20.0000 mg | ORAL_TABLET | Freq: Three times a day (TID) | ORAL | 3 refills | Status: DC | PRN

## 2016-05-04 MED ORDER — BACLOFEN 40 MG/20ML IT SOLN: 40.0000 mg | Freq: Once | INTRATHECAL | Status: DC

## 2016-05-04 MED ORDER — BACLOFEN 40 MG/20ML IT SOLN
80.0000 mg | Freq: Once | INTRATHECAL | Status: AC
  Administered 2016-05-04: 80 mg via INTRATHECAL

## 2016-05-04 NOTE — Telephone Encounter (Signed)
RXs for xanax and baclofen faxed to [facility]. Received a receipt of confirmation.

## 2016-05-04 NOTE — Progress Notes (Signed)
Please refer to baclofen pump refill note.

## 2016-05-04 NOTE — Procedures (Signed)
      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 She has recently had an exacerbation of her MS with increased tension in the stomach muscles, difficulty with walking, some occasional double vision. The patient has taken a five-day course of ACTHAR. She has increased her baclofen dosing orally. She comes in for a baclofen pump refill.  Baclofen pump refill note  The baclofen pump site was cleaned with Betadine solution. A 21-gauge needle was inserted into the pump port site. Approximately 6 cc of residual baclofen was removed, the pump indicates a 4.6 cc residual. 40 cc of replacement baclofen was placed into the pump at 2000 mcg/cc concentration.  The pump was reprogrammed for the following settings: The pump was maintained at the current settings with a basal rate of 28.0 g per hour. From 3 PM until 8 PM, the patient gets a rate of 30.0 mcg/h, from 8 PM until midnight she gets 33.0 mcg/h. The total daily dose is 701.9 g per day.  The alarm volume is set at 1.5 cc. The next alarm date is 08/21/2016.  The patient tolerated the procedure well. There were no complications of the above procedure.  The [facility] number is [phone] The baclofen expiration date is August 2019. The baclofen lot number is [id number].

## 2016-05-05 ENCOUNTER — Encounter: Payer: Medicare Other | Admitting: Neurology

## 2016-05-05 NOTE — Telephone Encounter (Signed)
She had sleep studies before.  She needs assistance in the sleep lab that require one on one care and we don't have medical beds, etc.   Non ambulatory patient cannot have study in our lab. I forward this to [name] to see if she has further ideas. CD

## 2016-05-05 NOTE — Telephone Encounter (Signed)
I saw she is already on Auto cpap. Maybe a set pressure with EPR of 3 would alleviate some of the centrals over a time period of adjusting to it. That is all I can think of.

## 2016-05-08 NOTE — Telephone Encounter (Signed)
I will ask her to come back to see sleep lab for an attended sleep study, based on her degree of residual apnea, symptoms and underlying comorbidities such as atrial fibrillation, MS, morbid obesity, venous insufficiency, baclofen pump implant.  She will need bed rails. Can it be arranged for a bedside commode to be available  and a female tech to attend to her? One on one.

## 2016-05-09 ENCOUNTER — Ambulatory Visit (INDEPENDENT_AMBULATORY_CARE_PROVIDER_SITE_OTHER): Payer: Medicare Other | Admitting: Pharmacist

## 2016-05-09 ENCOUNTER — Encounter: Payer: Self-pay | Admitting: Internal Medicine

## 2016-05-09 ENCOUNTER — Ambulatory Visit (INDEPENDENT_AMBULATORY_CARE_PROVIDER_SITE_OTHER): Payer: Medicare Other | Admitting: Internal Medicine

## 2016-05-09 VITALS — BP 110/64 | HR 80 | Ht 63.0 in

## 2016-05-09 DIAGNOSIS — I509 Heart failure, unspecified: Secondary | ICD-10-CM

## 2016-05-09 DIAGNOSIS — I4819 Other persistent atrial fibrillation: Secondary | ICD-10-CM

## 2016-05-09 DIAGNOSIS — Z7901 Long term (current) use of anticoagulants: Secondary | ICD-10-CM | POA: Diagnosis not present

## 2016-05-09 DIAGNOSIS — R600 Localized edema: Secondary | ICD-10-CM

## 2016-05-09 DIAGNOSIS — I481 Persistent atrial fibrillation: Secondary | ICD-10-CM

## 2016-05-09 DIAGNOSIS — I429 Cardiomyopathy, unspecified: Secondary | ICD-10-CM

## 2016-05-09 DIAGNOSIS — G4737 Central sleep apnea in conditions classified elsewhere: Secondary | ICD-10-CM

## 2016-05-09 DIAGNOSIS — G35 Multiple sclerosis: Secondary | ICD-10-CM | POA: Diagnosis not present

## 2016-05-09 DIAGNOSIS — I428 Other cardiomyopathies: Secondary | ICD-10-CM

## 2016-05-09 LAB — POCT INR: INR: 3

## 2016-05-09 MED ORDER — DILTIAZEM HCL ER COATED BEADS 120 MG PO CP24: 120.0000 mg | ORAL_CAPSULE | Freq: Every day | ORAL | 3 refills | Status: DC

## 2016-05-09 NOTE — Progress Notes (Signed)
OFFICE NOTE  Chief Complaint:  Routine follow-up  Primary Care Physician: [name], MD  HPI:  Toni [name] is a 70 year old female with multiple sclerosis, paroxysmal atrial fibrillation, on Coumadin, morbid obesity, obstructive sleep apnea, and dyslipidemia. She has had worsening swelling in her legs, thought to be due to an MS flare. She has a history of varicose veins with severe venous insufficiency in both the right and left greater saphenous veins. The right greater saphenous vein was actually successfully ablated by my partner, Dr. Elisabeth Cara, about 1 to 2 years ago. She does have residual reflux in the left greater saphenous vein extending from the proximal calf up to the saphenofemoral junction. The vein measured between 4 and 8 mm with between 1 and 6 seconds of reflux. We had discussed ablation of that before, but she has canceled several times at procedures. At this point, I think there is little benefit to continuing to try for her to undergo ablation. 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 She can wear work showed a compression stockings and this does seem to help her. With regards to her atrial fibrillation she has not had significant recurrence to her knowledge. She continues on warfarin and has been therapeutic, with her INR of 1.9 in the office by fingerstick today.  Toni [name] returns today for followup. She reports her MS is somewhat progressive. She continues to have some enlargement of her legs but no significant swelling. There is a mild varicosities in the right lower extremity. She is unaware of her atrial fibrillation. INR today was 2.3.   I saw Toni [name] back in the office today,  She is describing more shortness of breath and feels that she's been in A. Fib more recently. In fact today6/21her EKG does show persistent atrial fibrillation. In the past she's been paroxysmal , but it may be that she is more frequently  having A. Fib. She has trouble telling whether not she is more short of breath or symptomatic from A. Fib versus her multiple sclerosis. She is also reporting more swelling in her left leg.  Toni [name] returns for follow-up today. She recently saw her neurologist regarding her MS which seems to be flaring. In addition she was noted to have significant apneic events despite using CPAP. There is concern for possible central sleep apnea. Both of these episodes are probably contributing to her recurrent atrial fibrillation. She's been wearing a monitor for a period of time which indicates a burden of A. fib of about 80% of the time. It does not appear that she is in persistent A. fib, rather she has had some episodes of sinus rhythm, but they're much less frequent than her A. Fib.  I saw Toni [name] back today in the office. Recently I saw her as an add-on visit briefly as she was describing some chest pain when she was getting her INR checked. An EKG at that time showed no ischemia. I did not feel that this was cardiac. She continues intermittently have abnormal pains both in her chest as well as her abdomen and extremities. This may be related to MS. She's had difficulty with CPAP and apparently getting a good mask fit. She has been a persistent atrial fibrillation for about 6 months. It's unclear whether or not she symptomatic with this and therefore I have not pursued a rhythm control strategy. Her rate control is generally pretty good and she has been therapeutic on  warfarin. She is reporting worsening swelling of her legs although her weight she says is been stable. She has a history of venous insufficiency and had a right greater saphenous vein ablation by one of my partners. She's concerned about more swelling in her left leg and whether she's developed worsening venous insufficiency. She also reports cold feet with numbness in her extremities. As she is basically wheelchair bound, I cannot assess whether she  is having any claudication.  Toni [name] returns today for follow-up. She underwent venous and arterial lower external Dopplers. Arterial Dopplers were normal which showed no evidence of obstruction. Her venous Doppler showed no DVT. There was significant venous reflux in the left greater saphenous vein. This is an area where she has swelling and discomfort. This may be amenable to treatment. I increased her Lasix and she reports some improvement in her leg swelling. She's trying to elevate her feet and reports she is intolerant of wearing compression stockings.  02/03/2016  Toni [name] was seen today in the office for follow-up of recent hospitalization. She was seeing her neurologist and complained of left chest and left arm pain and was referred to the hospital. She ruled out for MI and was felt that her chest pain was on coronary. She underwent an echocardiogram which showed low-normal EF but was also found to be in A. fib with rapid ventricular response. Her A. fib is persistent at this time but the rate was elevated. She was started on Cardizem which helped improve her rate and was discharged on Cardizem CD 120 mg daily. Heart rate today is better controlled in the 70s. She reports her arm pain is improved and she said today that she "did not feel that it was her heart".  05/09/2016  Toni [name] was seen in the office for follow-up today. She reports improved heart rate control with heart rates between 50 and 80 on diltiazem. Over this will help with her mild cardiomyopathy with EF 50-55% on recent echo. She reports some increased fluid gain particularly in her abdomen. This may be due to some degree of right heart dysfunction. She's working with her neurologist for MS as well as her sleep apnea. There is a component of central sleep apnea and she is reportedly going to have additional sleep study to look for the need for BiPAP therapy. She denies any recurrent chest pain or left arm pain.  PMHx:  Past  Medical History:  Diagnosis Date  . Abnormality of gait 03/01/2015  . Anxiety   . Arthritis    "knees" (01/19/2016)  . Atrial fibrillation with RVR ([place]) 01/19/2016  . Childhood asthma   . Chronic pain    "nerve pain from the MS"  . Depression   . DVT (deep venous thrombosis) ([place]) 1983   "RLE; may have just been phlebitis; it was before the age of dopplers"  . 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 GERD (gastroesophageal reflux disease)   . History of stress test 06/21/10   limited exam with some degree of breast attenuatin of the apex however a component of apical ischemia is not excluded, EF 62%; cardiac cath   . HLD (hyperlipidemia)   . Hx of echocardiogram 04/22/10   EF 55-60%, no valve issues  . Hypertension   . Hypothyroidism   . Migraine    "none since I stopped beta blocker in 1990s" (01/19/2016)  . Multiple sclerosis ([place])  has had this may 1989-[place] reg doc  . OSA on CPAP   . PAF (paroxysmal atrial fibrillation) ([place])    on coumadin; documented on monitor 06/2010  . Pneumonia 11/2011  . Shortness of breath   . Thyroid disease     Past Surgical History:  Procedure Laterality Date  . ANTERIOR CERVICAL DECOMP/DISCECTOMY FUSION  01/2004  . BACK SURGERY    . CARDIAC CATHETERIZATION  06/22/2010   normal coronary arteries, PAF  . CATARACT EXTRACTION, BILATERAL Bilateral   . CHILD BIRTHS     X2  . DILATION AND CURETTAGE OF UTERUS    . INFUSION PUMP IMPLANTATION  ~ 2009   baclofen infusion in lower abd  . PAIN PUMP REVISION N/A 07/29/2014   Procedure: Baclofen pump replacement;  Surgeon: Erline Levine, MD;  Location: [place] [facility];  Service: Neurosurgery;  Laterality: N/A;  Baclofen pump replacement  . TONSILLECTOMY AND ADENOIDECTOMY    . VARICOSE VEIN SURGERY  ~ 1968  . VENOUS ABLATION  12/16/2010   radiofreq ablation -Dr Elisabeth Cara and [facility]  . WISDOM TOOTH EXTRACTION      FAMHx:  Family History  Problem Relation  Age of Onset  . Coronary artery disease Father     at age 46  . Coronary artery disease Maternal Grandmother   . Depression Maternal Grandmother   . Cancer Paternal Grandmother   . Depression Mother   . Alcoholism Son     SOCHx:   reports that she quit smoking about 39 years ago. Her smoking use included Cigarettes. She has a 3.50 pack-year smoking history. She has never used smokeless tobacco. She reports that she drinks about 0.6 oz of alcohol per week . She reports that she does not use drugs.  ALLERGIES:  Allergies  Allergen Reactions  . Hydrocodone Nausea And Vomiting    Hydrocodone causes vomiting  . Oxycodone     Oral oxycodone causes vomiting  . Chlorhexidine Gluconate     Sores at site  . Zoloft [Sertraline] Hives, Swelling and Rash    ROS: Pertinent items noted in HPI and remainder of comprehensive ROS otherwise negative.  HOME MEDS: Current Outpatient Prescriptions  Medication Sig Dispense Refill  . ALPRAZolam (XANAX) 0.5 MG tablet Take 1 tablet (0.5 mg total) by mouth 2 (two) times daily as needed for anxiety. 180 tablet 1  . baclofen (LIORESAL) 20 MG tablet Take 1 tablet (20 mg total) by mouth 3 (three) times daily as needed for muscle spasms. 180 each 3  . buPROPion (WELLBUTRIN XL) 300 MG 24 hr tablet Take 300 mg by mouth daily.    . cetirizine (ZYRTEC) 10 MG tablet Take 10 mg by mouth daily.    . Cholecalciferol (VITAMIN D) 2000 units tablet Take 2,000 Units by mouth 2 (two) times daily.    Marland Kitchen co-enzyme Q-10 30 MG capsule Take 30 mg by mouth daily.    . corticotropin (ATHACAR H.P.) 80 UNIT/ML injectable gel Inject 1 mL (80 Units total) into the skin every other day. To achieve control of relapsing MS Symptoms. (total of 5 injections per month) 5 mL 6  . diltiazem (CARDIZEM CD) 120 MG 24 hr capsule Take 1 capsule (120 mg total) by mouth daily. 90 capsule 3  . fluticasone (FLONASE) 50 MCG/ACT nasal spray Place into both nostrils daily as needed for allergies. Use as  needed for allergies    . furosemide (LASIX) 40 MG tablet Take 1.5 tablets (60 mg total) by mouth daily. Take 60 mg by  mouth daily 135 tablet 3  . gabapentin (NEURONTIN) 300 MG capsule Take 1 capsule by mouth up to four times daily. 360 capsule 3  . GENERLAC 10 GM/15ML SOLN TAKE 7.5 ML BY MOUTH TWICE DAILY 900 mL 0  . levothyroxine (SYNTHROID, LEVOTHROID) 75 MCG tablet Take 75 mcg by mouth daily.    . Multiple Minerals-Vitamins (CALCIUM & VIT D3 BONE HEALTH PO) Take by mouth 2 (two) times daily. 1000/500mg     . Nebivolol HCl (BYSTOLIC) 20 MG TABS Take 20 mg by mouth daily.    . Omega-3 Fatty Acids (FISH OIL) 600 MG CAPS Take 600 mg by mouth daily.     Marland Kitchen omeprazole (PRILOSEC) 20 MG capsule Take 20 mg by mouth daily.     . ranitidine (ZANTAC) 150 MG tablet Take 150 mg by mouth at bedtime.     . simvastatin (ZOCOR) 20 MG tablet Take 10 mg by mouth at bedtime.     . SYRINGE/NEEDLE, DISP, 1 ML (BD LUER-LOK SYRINGE) 25G X 5/8" 1 ML MISC Use as directed to inject  Acthar 10 each prn  . Tamsulosin HCl (FLOMAX) 0.4 MG CAPS Take 0.4 mg by mouth daily.    Marland Kitchen warfarin (COUMADIN) 5 MG tablet Take 1 tablet by mouth daily or as directed by coumadin clinic. 90 tablet 2   Current Facility-Administered Medications  Medication Dose Route Frequency Provider Last Rate Last Dose  . baclofen (LIORESAL) intrathecal injection 40 mg/74mL  40 mg Intrathecal Once Kathrynn Ducking, MD        LABS/IMAGING: No results found for this or any previous visit (from the past 48 hour(s)). No results found.  VITALS: BP 110/64   Pulse 80   Ht 5\' 3"  (1.6 m)   EXAM: General appearance: alert, no distress and morbidly obese Lungs: clear to auscultation bilaterally Heart: regular rate and rhythm, S1, S2 normal, no murmur, click, rub or gallop Extremities: extremities normal, atraumatic, no cyanosis or edema Pulses: 2+ and symmetric Neurologic: Mental status: Alert, oriented, thought content appropriate, Wheelchair-bound,  history of MS  EKG: A. fib at 80  ASSESSMENT: 1. Persistent atrial fibrillation - 80% frequency of A. Fib (not clear if symptomatic) 2. Morbid obesity 3. Obstructive sleep apnea on CPAP 4. Multiple sclerosis 5. Chronic anticoagulation on warfarin - INR 2.3 today 6. Chronic venous insufficiency - +LGSV reflux 7. Hypertension-controlled 8. Noncardiac chest and left arm pain 9. Non-ischemic cardiomyopathy - EF 50%, possibly related to a-fib with RVR  PLAN: 1.   Toni [name] now has better heart rate control on diltiazem with her A. fib. Hopefully her EF is improved somewhat. She is having some worsening abdominal swelling which could be some right heart dysfunction related to obstructive sleep apnea. Recently she was thought to have some central sleep apnea component and may need BiPAP. I agree with an increase in her Lasix to 40 mg twice a day to help with her additional edema. We'll recheck an echocardiogram in 3 months to look for any change in LV function and particularly right heart function. Plan follow-up at that time.  Pixie Casino, MD, [facility] Attending Cardiologist [place] 05/09/2016, 12:29 PM

## 2016-05-09 NOTE — Patient Instructions (Addendum)
Medication Instructions:  Your physician recommends that you continue on your current medications as directed. Please refer to the Current Medication list given to you today.  Labwork: None Ordered  Testing/Procedures: Your physician has requested that you have an echocardiogram. Echocardiography is a painless test that uses sound waves to create images of your heart. It provides your doctor with information about the size and shape of your heart and how well your heart's chambers and valves are working. This procedure takes approximately one hour. There are no restrictions for this procedure.  Follow-Up: Your physician recommends that you schedule a follow-up appointment in: 3 MONTHS WITH DR HILTY AFTER ECHO HAS BEEN COMPLETED. Any Other Special Instructions Will Be Listed Below (If Applicable).  SCHEDULE ECHO in 3 months.   If you need a refill on your cardiac medications before your next appointment, please call your pharmacy.

## 2016-05-17 ENCOUNTER — Telehealth: Payer: Self-pay

## 2016-05-17 NOTE — Telephone Encounter (Signed)
-----   Message from Kathrynn Ducking, MD sent at 05/04/2016  2:52 PM EDT ----- The next alarm date for the baclofen pump is 08/21/16, she will need a visit for the refill prior to this.

## 2016-05-17 NOTE — Telephone Encounter (Signed)
Called pt and spoke to her husband. He said that pt was not up yet and would have to call back to schedule Baclofen pump refill for some time in December.

## 2016-05-18 DIAGNOSIS — G35 Multiple sclerosis: Secondary | ICD-10-CM | POA: Diagnosis not present

## 2016-05-18 DIAGNOSIS — K592 Neurogenic bowel, not elsewhere classified: Secondary | ICD-10-CM | POA: Diagnosis not present

## 2016-05-18 DIAGNOSIS — I10 Essential (primary) hypertension: Secondary | ICD-10-CM | POA: Diagnosis not present

## 2016-05-18 DIAGNOSIS — E039 Hypothyroidism, unspecified: Secondary | ICD-10-CM | POA: Diagnosis not present

## 2016-05-18 DIAGNOSIS — N319 Neuromuscular dysfunction of bladder, unspecified: Secondary | ICD-10-CM | POA: Diagnosis not present

## 2016-05-18 DIAGNOSIS — I4891 Unspecified atrial fibrillation: Secondary | ICD-10-CM | POA: Diagnosis not present

## 2016-05-23 NOTE — Telephone Encounter (Signed)
Called and spoke to pt. Appt scheduled 08/17/16 for pump refill.

## 2016-05-24 ENCOUNTER — Ambulatory Visit (INDEPENDENT_AMBULATORY_CARE_PROVIDER_SITE_OTHER): Payer: Medicare Other | Admitting: Neurology

## 2016-05-24 ENCOUNTER — Ambulatory Visit: Payer: Medicare Other | Admitting: Neurology

## 2016-05-24 DIAGNOSIS — G4733 Obstructive sleep apnea (adult) (pediatric): Secondary | ICD-10-CM

## 2016-05-25 DIAGNOSIS — N319 Neuromuscular dysfunction of bladder, unspecified: Secondary | ICD-10-CM | POA: Diagnosis not present

## 2016-05-25 DIAGNOSIS — K592 Neurogenic bowel, not elsewhere classified: Secondary | ICD-10-CM | POA: Diagnosis not present

## 2016-05-25 DIAGNOSIS — E039 Hypothyroidism, unspecified: Secondary | ICD-10-CM | POA: Diagnosis not present

## 2016-05-25 DIAGNOSIS — I4891 Unspecified atrial fibrillation: Secondary | ICD-10-CM | POA: Diagnosis not present

## 2016-05-25 DIAGNOSIS — G35 Multiple sclerosis: Secondary | ICD-10-CM | POA: Diagnosis not present

## 2016-05-25 DIAGNOSIS — I10 Essential (primary) hypertension: Secondary | ICD-10-CM | POA: Diagnosis not present

## 2016-06-01 ENCOUNTER — Telehealth: Payer: Self-pay | Admitting: Neurology

## 2016-06-01 DIAGNOSIS — I4891 Unspecified atrial fibrillation: Secondary | ICD-10-CM | POA: Diagnosis not present

## 2016-06-01 DIAGNOSIS — E039 Hypothyroidism, unspecified: Secondary | ICD-10-CM | POA: Diagnosis not present

## 2016-06-01 DIAGNOSIS — K592 Neurogenic bowel, not elsewhere classified: Secondary | ICD-10-CM | POA: Diagnosis not present

## 2016-06-01 DIAGNOSIS — N319 Neuromuscular dysfunction of bladder, unspecified: Secondary | ICD-10-CM | POA: Diagnosis not present

## 2016-06-01 DIAGNOSIS — G35 Multiple sclerosis: Secondary | ICD-10-CM | POA: Diagnosis not present

## 2016-06-01 DIAGNOSIS — I10 Essential (primary) hypertension: Secondary | ICD-10-CM | POA: Diagnosis not present

## 2016-06-01 NOTE — Telephone Encounter (Signed)
I spoke to Dr. Brett Fairy. She is agreeable to the Home PT maintenance program. I spoke to Lund, PT at [facility] and gave her those orders.

## 2016-06-01 NOTE — Telephone Encounter (Signed)
Geraldine Solar, PT/[facility] [phone] called to request verbal orders for Home PT Maintenance Program, planning to do 1 x week for 4 weeks then skip pattern every other week for 4 weeks.

## 2016-06-03 DIAGNOSIS — N319 Neuromuscular dysfunction of bladder, unspecified: Secondary | ICD-10-CM | POA: Diagnosis not present

## 2016-06-03 DIAGNOSIS — E039 Hypothyroidism, unspecified: Secondary | ICD-10-CM | POA: Diagnosis not present

## 2016-06-03 DIAGNOSIS — I4891 Unspecified atrial fibrillation: Secondary | ICD-10-CM | POA: Diagnosis not present

## 2016-06-03 DIAGNOSIS — G35 Multiple sclerosis: Secondary | ICD-10-CM | POA: Diagnosis not present

## 2016-06-03 DIAGNOSIS — K592 Neurogenic bowel, not elsewhere classified: Secondary | ICD-10-CM | POA: Diagnosis not present

## 2016-06-03 DIAGNOSIS — I10 Essential (primary) hypertension: Secondary | ICD-10-CM | POA: Diagnosis not present

## 2016-06-03 DIAGNOSIS — Z7901 Long term (current) use of anticoagulants: Secondary | ICD-10-CM | POA: Diagnosis not present

## 2016-06-03 DIAGNOSIS — E785 Hyperlipidemia, unspecified: Secondary | ICD-10-CM | POA: Diagnosis not present

## 2016-06-05 ENCOUNTER — Ambulatory Visit (INDEPENDENT_AMBULATORY_CARE_PROVIDER_SITE_OTHER): Payer: Medicare Other | Admitting: Pharmacist

## 2016-06-05 DIAGNOSIS — Z7901 Long term (current) use of anticoagulants: Secondary | ICD-10-CM | POA: Diagnosis not present

## 2016-06-05 LAB — POCT INR: INR: 3.2

## 2016-06-07 ENCOUNTER — Telehealth: Payer: Self-pay | Admitting: Neurology

## 2016-06-07 DIAGNOSIS — G4731 Primary central sleep apnea: Secondary | ICD-10-CM

## 2016-06-07 NOTE — Telephone Encounter (Signed)
Pt request sleep study results. She is aware there is a 10-14 business day turn around time for results

## 2016-06-08 DIAGNOSIS — E039 Hypothyroidism, unspecified: Secondary | ICD-10-CM | POA: Diagnosis not present

## 2016-06-08 DIAGNOSIS — K592 Neurogenic bowel, not elsewhere classified: Secondary | ICD-10-CM | POA: Diagnosis not present

## 2016-06-08 DIAGNOSIS — I10 Essential (primary) hypertension: Secondary | ICD-10-CM | POA: Diagnosis not present

## 2016-06-08 DIAGNOSIS — I4891 Unspecified atrial fibrillation: Secondary | ICD-10-CM | POA: Diagnosis not present

## 2016-06-08 DIAGNOSIS — G35 Multiple sclerosis: Secondary | ICD-10-CM | POA: Diagnosis not present

## 2016-06-08 DIAGNOSIS — N319 Neuromuscular dysfunction of bladder, unspecified: Secondary | ICD-10-CM | POA: Diagnosis not present

## 2016-06-12 ENCOUNTER — Other Ambulatory Visit: Payer: Self-pay

## 2016-06-12 DIAGNOSIS — Z9989 Dependence on other enabling machines and devices: Principal | ICD-10-CM

## 2016-06-12 DIAGNOSIS — G4733 Obstructive sleep apnea (adult) (pediatric): Secondary | ICD-10-CM

## 2016-06-12 NOTE — Telephone Encounter (Signed)
Study performed 05/24/2016. Received results for study 06/09/2016.  I called pt. I advised her that her cpap titration showed frequent snoring and low levels of oxygen at higher levels on cpap and therefore Dr. Brett Fairy recommends a bipap. Pt says that she would like the order for bipap to be sent to [facility]. She says that she was surprised that the sleep tech did not use bipap on her the night of her sleep study. A follow up appt was made for 1/22 for bipap insurance purposes. Pt verbalized understanding of results. Pt had no questions at this time but was encouraged to call back if questions arise.  Pt says that her MS is "giving her problems today." I offered her an appt but pt says she will call me back to schedule an appt if she "can't handle it."  Order placed. Will send to [facility].

## 2016-06-12 NOTE — Addendum Note (Signed)
Addended by: [employer] A on: 06/12/2016 10:13 AM   Modules accepted: Orders

## 2016-06-15 DIAGNOSIS — N319 Neuromuscular dysfunction of bladder, unspecified: Secondary | ICD-10-CM | POA: Diagnosis not present

## 2016-06-15 DIAGNOSIS — G35 Multiple sclerosis: Secondary | ICD-10-CM | POA: Diagnosis not present

## 2016-06-15 DIAGNOSIS — I4891 Unspecified atrial fibrillation: Secondary | ICD-10-CM | POA: Diagnosis not present

## 2016-06-15 DIAGNOSIS — K592 Neurogenic bowel, not elsewhere classified: Secondary | ICD-10-CM | POA: Diagnosis not present

## 2016-06-15 DIAGNOSIS — I10 Essential (primary) hypertension: Secondary | ICD-10-CM | POA: Diagnosis not present

## 2016-06-15 DIAGNOSIS — E039 Hypothyroidism, unspecified: Secondary | ICD-10-CM | POA: Diagnosis not present

## 2016-06-20 ENCOUNTER — Other Ambulatory Visit: Payer: Self-pay | Admitting: Neurology

## 2016-06-22 ENCOUNTER — Telehealth: Payer: Self-pay | Admitting: Neurology

## 2016-06-22 DIAGNOSIS — I4891 Unspecified atrial fibrillation: Secondary | ICD-10-CM | POA: Diagnosis not present

## 2016-06-22 DIAGNOSIS — N319 Neuromuscular dysfunction of bladder, unspecified: Secondary | ICD-10-CM | POA: Diagnosis not present

## 2016-06-22 DIAGNOSIS — K592 Neurogenic bowel, not elsewhere classified: Secondary | ICD-10-CM | POA: Diagnosis not present

## 2016-06-22 DIAGNOSIS — E039 Hypothyroidism, unspecified: Secondary | ICD-10-CM | POA: Diagnosis not present

## 2016-06-22 DIAGNOSIS — I10 Essential (primary) hypertension: Secondary | ICD-10-CM | POA: Diagnosis not present

## 2016-06-22 DIAGNOSIS — G35 Multiple sclerosis: Secondary | ICD-10-CM | POA: Diagnosis not present

## 2016-06-22 NOTE — Telephone Encounter (Signed)
Jeannine/ PT [facility] called wanting to know if the pt could try using [employer]st compression boots. She has some that have been donated to her and she thinks it will help the pt. She needs an order. May [phone]

## 2016-06-22 NOTE — Telephone Encounter (Signed)
Can I give this order verbally ? CD

## 2016-06-22 NOTE — Telephone Encounter (Signed)
Spoke with [place] who stated she can take a verbal order for the compression boot. She stated she felt it would be very helpful to this patient, and she will place order. She verbalized understanding, appreciation of call.

## 2016-06-26 ENCOUNTER — Ambulatory Visit (INDEPENDENT_AMBULATORY_CARE_PROVIDER_SITE_OTHER): Payer: Medicare Other | Admitting: Pharmacist Clinician (PhC)/ Clinical Pharmacy Specialist

## 2016-06-26 DIAGNOSIS — Z7901 Long term (current) use of anticoagulants: Secondary | ICD-10-CM

## 2016-06-26 LAB — POCT INR: INR: 2.1

## 2016-06-27 DIAGNOSIS — Z23 Encounter for immunization: Secondary | ICD-10-CM | POA: Diagnosis not present

## 2016-06-29 DIAGNOSIS — I10 Essential (primary) hypertension: Secondary | ICD-10-CM | POA: Diagnosis not present

## 2016-06-29 DIAGNOSIS — E039 Hypothyroidism, unspecified: Secondary | ICD-10-CM | POA: Diagnosis not present

## 2016-06-29 DIAGNOSIS — I4891 Unspecified atrial fibrillation: Secondary | ICD-10-CM | POA: Diagnosis not present

## 2016-06-29 DIAGNOSIS — N319 Neuromuscular dysfunction of bladder, unspecified: Secondary | ICD-10-CM | POA: Diagnosis not present

## 2016-06-29 DIAGNOSIS — G35 Multiple sclerosis: Secondary | ICD-10-CM | POA: Diagnosis not present

## 2016-06-29 DIAGNOSIS — K592 Neurogenic bowel, not elsewhere classified: Secondary | ICD-10-CM | POA: Diagnosis not present

## 2016-07-03 ENCOUNTER — Telehealth (INDEPENDENT_AMBULATORY_CARE_PROVIDER_SITE_OTHER): Payer: Self-pay | Admitting: *Deleted

## 2016-07-03 MED ORDER — TRAMADOL HCL 50 MG PO TABS: 50.0000 mg | ORAL_TABLET | Freq: Three times a day (TID) | ORAL | 0 refills | Status: DC | PRN

## 2016-07-03 NOTE — Telephone Encounter (Signed)
PT. Called to make an appt, I scheduled her for the first available but she is also requesting something for pain. She is requesting tramadol, its the only one she can take.

## 2016-07-03 NOTE — Telephone Encounter (Signed)
Please advise, patient has an appt Wednesday with you

## 2016-07-03 NOTE — Telephone Encounter (Signed)
Called into pharmacy, LMOM for patient that I did so 

## 2016-07-03 NOTE — Telephone Encounter (Signed)
Ok to call in tramadol

## 2016-07-04 ENCOUNTER — Telehealth: Payer: Self-pay

## 2016-07-04 DIAGNOSIS — G4731 Primary central sleep apnea: Secondary | ICD-10-CM

## 2016-07-04 NOTE — Telephone Encounter (Signed)
[facility] sent Dr. Brett Fairy a faxed note asking her to review therapy report for pt's PAP therapy. Dr. Brett Fairy reviewed it but does not want to change pressures, but wants [facility] to have a better mask fit for pt and to remind pt to sleep on her side.

## 2016-07-05 ENCOUNTER — Ambulatory Visit (INDEPENDENT_AMBULATORY_CARE_PROVIDER_SITE_OTHER): Payer: Medicare Other | Admitting: Orthopaedic Surgery

## 2016-07-05 DIAGNOSIS — G5602 Carpal tunnel syndrome, left upper limb: Secondary | ICD-10-CM

## 2016-07-05 DIAGNOSIS — M25511 Pain in right shoulder: Secondary | ICD-10-CM

## 2016-07-05 DIAGNOSIS — G8929 Other chronic pain: Secondary | ICD-10-CM

## 2016-07-05 MED ORDER — METHYLPREDNISOLONE ACETATE 40 MG/ML IJ SUSP
40.0000 mg | INTRAMUSCULAR | Status: AC | PRN
  Administered 2016-07-05: 40 mg

## 2016-07-05 MED ORDER — LIDOCAINE HCL 1 % IJ SOLN
3.0000 mL | INTRAMUSCULAR | Status: AC | PRN
  Administered 2016-07-05: 3 mL

## 2016-07-05 MED ORDER — METHYLPREDNISOLONE ACETATE 40 MG/ML IJ SUSP
40.0000 mg | INTRAMUSCULAR | Status: AC | PRN
  Administered 2016-07-05: 40 mg via INTRA_ARTICULAR

## 2016-07-05 MED ORDER — LIDOCAINE HCL 1 % IJ SOLN
1.0000 mL | INTRAMUSCULAR | Status: AC | PRN
  Administered 2016-07-05: 1 mL

## 2016-07-05 NOTE — Progress Notes (Signed)
Office Visit Note   Patient: Toni [name]           Date of Birth: Apr 28, 1946           MRN: [id number] Visit Date: 07/05/2016              Requested by: Prince Solian, MD [address] [place], [place] [address] PCP: [name], MD   Assessment & Plan: Visit Diagnoses:  1. Chronic right shoulder pain   2. Carpal tunnel syndrome, left upper limb     Plan: She tolerated both the left carpal tunnel injection in the right shoulder injection well. Certainly these will flareup from time to time due to the fact that she has multiple sclerosis and ambulate mainly to wheelchair and a walker. She can always come back for repeat injections but knows to wait at least 3-4 months between injections.  Follow-Up Instructions: Return if symptoms worsen or fail to improve.   Orders:  No orders of the defined types were placed in this encounter.  No orders of the defined types were placed in this encounter.     Procedures: Large Joint Inj Date/Time: 07/05/2016 10:30 AM Performed by: Mcarthur Rossetti Authorized by: Jean Rosenthal Y   Location:  Shoulder Site:  R subacromial bursa Approach:  Posterior Ultrasound Guidance: No   Fluoroscopic Guidance: No   Arthrogram: No   Medications:  3 mL lidocaine 1 %; 40 mg methylPREDNISolone acetate 40 MG/ML  Hand/UE Inj Date/Time: 07/05/2016 10:32 AM Performed by: Mcarthur Rossetti Authorized by: Mcarthur Rossetti   Condition: carpal tunnel   Site:  L carpal tunnel Needle Size:  25 G Approach:  Volar Ultrasound Guidance: No   Medications:  1 mL lidocaine 1 %; 40 mg methylPREDNISolone acetate 40 MG/ML     Clinical Data: No additional findings.   Subjective: Chief Complaint  Patient presents with  . Left Hand - Pain    Patient would like injection for her carpal tunnel today.  . Right Shoulder - Pain    Patient states her shoulder still really bothering her. She states it's "popping" quite a  bit   She is someone who with her multiple sclerosis ambulates with a walker and a wheelchair since she post lot of pressure through her hands and her shoulders. She said the left hand has been numb and tingly and that my carpal tunnel injection before helped. HPI  Review of Systems Negative for headache, chest pain, shortness breath, fever, chills, nausea, vomiting.  Objective: Vital Signs: There were no vitals taken for this visit.  Physical Exam  Ortho Exam Her left hand has a weak grip strength and she has subjective decreased sensation in the median nerve distribution. Her right shoulder has pain to its arc of motion but full range of motion with positive Neer and Hawkins signs. Specialty Comments:  No specialty comments available.  Imaging: No results found.   PMFS History: Patient Active Problem List   Diagnosis Date Noted  . Nonischemic cardiomyopathy ([place]) 05/09/2016  . Localized edema 05/09/2016  . Chest pain at rest 02/03/2016  . Long term current use of anticoagulant therapy 02/03/2016  . Chest pain 01/19/2016  . Chronic atrial fibrillation ([place]) 01/19/2016  . 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 Atrial fibrillation with RVR ([place]) 01/19/2016  . Persistent atrial fibrillation with RVR ([place]) 01/19/2016  . Dysenteric diarrhea 10/04/2015  . Medication course changed 06/02/2015  . Amnestic MCI (mild cognitive impairment with  memory loss) 03/30/2015  . Obesity hypoventilation syndrome ([place]) 03/30/2015  . Abnormality of gait 03/01/2015  . Chronic constipation with overflow incontinence 01/25/2015  . 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 Medication monitoring encounter 01/25/2015  . 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 Acute cystitis with hematuria 08/24/2014  . MS (multiple sclerosis) ([place]) 07/29/2014  . Closed  right ankle fracture 10/30/2013  . Leucocytosis 10/30/2013  . Ankle fracture 10/30/2013  . Varicose veins of both lower extremities with complications 0000000  . Paroxysmal a-fib ([place]) 06/04/2013  . Morbid obesity ([place]) 06/04/2013  . OSA on CPAP 06/04/2013  . Muscle spasms of lower extremity 02/24/2013  . Long term (current) use of anticoagulants 11/05/2012  . 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 Aspiration pneumonia vs Failed outpatient Rx of CAP 11/26/2011  . Hypertension   . Atrial fibrillation ([place])   . Thyroid disease   . HLD (hyperlipidemia)    Past Medical History:  Diagnosis Date  . Abnormality of gait 03/01/2015  . Anxiety   . Arthritis    "knees" (01/19/2016)  . Atrial fibrillation with RVR ([place]) 01/19/2016  . Childhood asthma   . Chronic pain    "nerve pain from the MS"  . Depression   . DVT (deep venous thrombosis) ([place]) 1983   "RLE; may have just been phlebitis; it was before the age of dopplers"  . 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 GERD (gastroesophageal reflux disease)   . History of stress test 06/21/10   limited exam with some degree of breast attenuatin of the apex however a component of apical ischemia is not excluded, EF 62%; cardiac cath   . HLD (hyperlipidemia)   . Hx of echocardiogram 04/22/10   EF 55-60%, no valve issues  . Hypertension   . Hypothyroidism   . Migraine    "none since I stopped beta blocker in 1990s" (01/19/2016)  . Multiple sclerosis ([place])    has had this may 1989-[facility] reg doc  . OSA on CPAP   . PAF (paroxysmal atrial fibrillation) ([place])    on coumadin; documented on monitor 06/2010  . Pneumonia 11/2011  . Shortness of breath   . Thyroid disease     Family History  Problem Relation Age of Onset  . Coronary artery disease Father     at age 65  . Coronary artery disease Maternal Grandmother   . Depression Maternal  Grandmother   . Cancer Paternal Grandmother   . Depression Mother   . Alcoholism Son     Past Surgical History:  Procedure Laterality Date  . ANTERIOR CERVICAL DECOMP/DISCECTOMY FUSION  01/2004  . BACK SURGERY    . CARDIAC CATHETERIZATION  06/22/2010   normal coronary arteries, PAF  . CATARACT EXTRACTION, BILATERAL Bilateral   . CHILD BIRTHS     X2  . DILATION AND CURETTAGE OF UTERUS    . INFUSION PUMP IMPLANTATION  ~ 2009   baclofen infusion in lower abd  . PAIN PUMP REVISION N/A 07/29/2014   Procedure: Baclofen pump replacement;  Surgeon: Erline Levine, MD;  Location: [facility];  Service: Neurosurgery;  Laterality: N/A;  Baclofen pump replacement  . TONSILLECTOMY AND ADENOIDECTOMY    . VARICOSE VEIN SURGERY  ~ 1968  . VENOUS ABLATION  12/16/2010   radiofreq ablation -  Dr Elisabeth Cara and [facility]  . WISDOM TOOTH EXTRACTION     Social History   Occupational History  . RETIRED    Social History Main Topics  . Smoking status: Former Smoker    Packs/day: 0.50    Years: 7.00    Types: Cigarettes    Quit date: 08/21/1976  . Smokeless tobacco: Never Used  . Alcohol use 0.6 oz/week    1 Glasses of wine per week  . Drug use: No  . Sexual activity: Not Currently

## 2016-07-11 DIAGNOSIS — I10 Essential (primary) hypertension: Secondary | ICD-10-CM | POA: Diagnosis not present

## 2016-07-11 DIAGNOSIS — N319 Neuromuscular dysfunction of bladder, unspecified: Secondary | ICD-10-CM | POA: Diagnosis not present

## 2016-07-11 DIAGNOSIS — E039 Hypothyroidism, unspecified: Secondary | ICD-10-CM | POA: Diagnosis not present

## 2016-07-11 DIAGNOSIS — I4891 Unspecified atrial fibrillation: Secondary | ICD-10-CM | POA: Diagnosis not present

## 2016-07-11 DIAGNOSIS — G35 Multiple sclerosis: Secondary | ICD-10-CM | POA: Diagnosis not present

## 2016-07-11 DIAGNOSIS — K592 Neurogenic bowel, not elsewhere classified: Secondary | ICD-10-CM | POA: Diagnosis not present

## 2016-07-17 ENCOUNTER — Ambulatory Visit (INDEPENDENT_AMBULATORY_CARE_PROVIDER_SITE_OTHER): Payer: Medicare Other | Admitting: Physician Assistant

## 2016-07-24 ENCOUNTER — Telehealth: Payer: Self-pay

## 2016-07-24 DIAGNOSIS — N312 Flaccid neuropathic bladder, not elsewhere classified: Secondary | ICD-10-CM | POA: Diagnosis not present

## 2016-07-24 DIAGNOSIS — G4733 Obstructive sleep apnea (adult) (pediatric): Secondary | ICD-10-CM

## 2016-07-24 DIAGNOSIS — G4731 Primary central sleep apnea: Secondary | ICD-10-CM

## 2016-07-24 DIAGNOSIS — G35 Multiple sclerosis: Secondary | ICD-10-CM | POA: Diagnosis not present

## 2016-07-24 NOTE — Telephone Encounter (Signed)
We received PAP download from [facility]. Per Dr. Brett Fairy she would like to change pressure settings. New order placed. 92% compliance, AHI 22.2, Central 9.4, AI: 20.5.

## 2016-07-24 NOTE — Telephone Encounter (Signed)
THank You! CD

## 2016-07-24 NOTE — Telephone Encounter (Signed)
Orders sent to [facility].  

## 2016-07-25 ENCOUNTER — Ambulatory Visit (INDEPENDENT_AMBULATORY_CARE_PROVIDER_SITE_OTHER): Payer: Medicare Other | Admitting: Pharmacist Clinician (PhC)/ Clinical Pharmacy Specialist

## 2016-07-25 ENCOUNTER — Other Ambulatory Visit: Payer: Self-pay

## 2016-07-25 DIAGNOSIS — Z7901 Long term (current) use of anticoagulants: Secondary | ICD-10-CM | POA: Diagnosis not present

## 2016-07-25 DIAGNOSIS — G4731 Primary central sleep apnea: Secondary | ICD-10-CM

## 2016-07-25 DIAGNOSIS — G4733 Obstructive sleep apnea (adult) (pediatric): Secondary | ICD-10-CM

## 2016-07-25 LAB — POCT INR: INR: 2.2

## 2016-07-27 ENCOUNTER — Telehealth: Payer: Self-pay | Admitting: Internal Medicine

## 2016-07-27 DIAGNOSIS — N319 Neuromuscular dysfunction of bladder, unspecified: Secondary | ICD-10-CM | POA: Diagnosis not present

## 2016-07-27 DIAGNOSIS — G35 Multiple sclerosis: Secondary | ICD-10-CM | POA: Diagnosis not present

## 2016-07-27 DIAGNOSIS — K592 Neurogenic bowel, not elsewhere classified: Secondary | ICD-10-CM | POA: Diagnosis not present

## 2016-07-27 DIAGNOSIS — R51 Headache: Secondary | ICD-10-CM | POA: Diagnosis not present

## 2016-07-27 DIAGNOSIS — E039 Hypothyroidism, unspecified: Secondary | ICD-10-CM | POA: Diagnosis not present

## 2016-07-27 DIAGNOSIS — I10 Essential (primary) hypertension: Secondary | ICD-10-CM | POA: Diagnosis not present

## 2016-07-27 DIAGNOSIS — R05 Cough: Secondary | ICD-10-CM | POA: Diagnosis not present

## 2016-07-27 DIAGNOSIS — I480 Paroxysmal atrial fibrillation: Secondary | ICD-10-CM | POA: Diagnosis not present

## 2016-07-27 DIAGNOSIS — Z6841 Body Mass Index (BMI) 40.0 and over, adult: Secondary | ICD-10-CM | POA: Diagnosis not present

## 2016-07-27 DIAGNOSIS — I4891 Unspecified atrial fibrillation: Secondary | ICD-10-CM | POA: Diagnosis not present

## 2016-07-27 NOTE — Telephone Encounter (Signed)
New message  Pt c/o medication issue:  1. Name of Medication: warfarin   2. How are you currently taking this medication (dosage and times per day)? 5mg    3. Are you having a reaction (difficulty breathing--STAT)? Headache.. Hurt to the touch   4. What is your medication issue? Would like to speak with RN

## 2016-07-27 NOTE — Telephone Encounter (Signed)
Spoke with patient, she sounds upset/scared because of headache and sore-to-touch area on her forehead above her left eye.  States that it has been tender off/on for past month, sometimes when she hits that area brushing her hair or just reaching up and touching that area.  Usually only lasts momentarily.  Doesn't happen every time.  Today she reports headache and very painful to touch that same area, all morning.    Advised her to contact her PCP about this and if they are not available to go to Urgent Care or ER for evaluation.  Patient voiced understanding.

## 2016-07-28 ENCOUNTER — Other Ambulatory Visit: Payer: Self-pay | Admitting: Internal Medicine

## 2016-07-28 DIAGNOSIS — R519 Headache, unspecified: Secondary | ICD-10-CM

## 2016-07-28 DIAGNOSIS — R51 Headache: Principal | ICD-10-CM

## 2016-07-31 ENCOUNTER — Telehealth: Payer: Self-pay | Admitting: *Deleted

## 2016-07-31 DIAGNOSIS — E039 Hypothyroidism, unspecified: Secondary | ICD-10-CM | POA: Diagnosis not present

## 2016-07-31 DIAGNOSIS — I4891 Unspecified atrial fibrillation: Secondary | ICD-10-CM | POA: Diagnosis not present

## 2016-07-31 DIAGNOSIS — N319 Neuromuscular dysfunction of bladder, unspecified: Secondary | ICD-10-CM | POA: Diagnosis not present

## 2016-07-31 DIAGNOSIS — G35 Multiple sclerosis: Secondary | ICD-10-CM | POA: Diagnosis not present

## 2016-07-31 DIAGNOSIS — K592 Neurogenic bowel, not elsewhere classified: Secondary | ICD-10-CM | POA: Diagnosis not present

## 2016-07-31 DIAGNOSIS — I10 Essential (primary) hypertension: Secondary | ICD-10-CM | POA: Diagnosis not present

## 2016-07-31 NOTE — Telephone Encounter (Signed)
Ok for PT 

## 2016-07-31 NOTE — Telephone Encounter (Signed)
OK for PT- very much in favor of this ! CD

## 2016-07-31 NOTE — Telephone Encounter (Signed)
Received call from [facility] PT w/[facility] requesting a verbal order for Physical therapy at home. She requests PT once a week x 4 weeks then once a week every other wk x 4 wks.  Please call.

## 2016-08-01 ENCOUNTER — Telehealth: Payer: Self-pay | Admitting: Neurology

## 2016-08-01 NOTE — Telephone Encounter (Signed)
She can take it , yes. CD

## 2016-08-01 NOTE — Telephone Encounter (Signed)
I spoke to Toni [name] and she is aware of VO below.

## 2016-08-01 NOTE — Telephone Encounter (Signed)
I spoke to patient and gave advice below.

## 2016-08-01 NOTE — Telephone Encounter (Signed)
Patient states it is time for her corticotropin (ATHACAR H.P.) 80 UNIT/ML injectable gel injection. Is it ok for her to take since she has a sinus infection?

## 2016-08-02 DIAGNOSIS — K592 Neurogenic bowel, not elsewhere classified: Secondary | ICD-10-CM | POA: Diagnosis not present

## 2016-08-02 DIAGNOSIS — E785 Hyperlipidemia, unspecified: Secondary | ICD-10-CM | POA: Diagnosis not present

## 2016-08-02 DIAGNOSIS — N319 Neuromuscular dysfunction of bladder, unspecified: Secondary | ICD-10-CM | POA: Diagnosis not present

## 2016-08-02 DIAGNOSIS — G35 Multiple sclerosis: Secondary | ICD-10-CM | POA: Diagnosis not present

## 2016-08-02 DIAGNOSIS — E039 Hypothyroidism, unspecified: Secondary | ICD-10-CM | POA: Diagnosis not present

## 2016-08-02 DIAGNOSIS — I10 Essential (primary) hypertension: Secondary | ICD-10-CM | POA: Diagnosis not present

## 2016-08-02 DIAGNOSIS — Z7901 Long term (current) use of anticoagulants: Secondary | ICD-10-CM | POA: Diagnosis not present

## 2016-08-02 DIAGNOSIS — I4891 Unspecified atrial fibrillation: Secondary | ICD-10-CM | POA: Diagnosis not present

## 2016-08-05 ENCOUNTER — Inpatient Hospital Stay (HOSPITAL_COMMUNITY)
Admission: EM | Admit: 2016-08-05 | Discharge: 2016-08-12 | DRG: 058 | Disposition: A | Discharge status: Skilled Nursing Facility | Payer: Medicare Other | Attending: Internal Medicine | Admitting: Internal Medicine

## 2016-08-05 DIAGNOSIS — G35 Multiple sclerosis: Secondary | ICD-10-CM | POA: Diagnosis not present

## 2016-08-05 DIAGNOSIS — R509 Fever, unspecified: Secondary | ICD-10-CM

## 2016-08-05 DIAGNOSIS — L8990 Pressure ulcer of unspecified site, unspecified stage: Secondary | ICD-10-CM | POA: Insufficient documentation

## 2016-08-05 DIAGNOSIS — I428 Other cardiomyopathies: Secondary | ICD-10-CM | POA: Diagnosis not present

## 2016-08-05 DIAGNOSIS — Z87891 Personal history of nicotine dependence: Secondary | ICD-10-CM

## 2016-08-05 DIAGNOSIS — I480 Paroxysmal atrial fibrillation: Secondary | ICD-10-CM | POA: Diagnosis present

## 2016-08-05 DIAGNOSIS — G8929 Other chronic pain: Secondary | ICD-10-CM | POA: Diagnosis present

## 2016-08-05 DIAGNOSIS — Z7901 Long term (current) use of anticoagulants: Secondary | ICD-10-CM

## 2016-08-05 DIAGNOSIS — F419 Anxiety disorder, unspecified: Secondary | ICD-10-CM | POA: Diagnosis present

## 2016-08-05 DIAGNOSIS — E039 Hypothyroidism, unspecified: Secondary | ICD-10-CM | POA: Diagnosis present

## 2016-08-05 DIAGNOSIS — G4733 Obstructive sleep apnea (adult) (pediatric): Secondary | ICD-10-CM

## 2016-08-05 DIAGNOSIS — Z981 Arthrodesis status: Secondary | ICD-10-CM

## 2016-08-05 DIAGNOSIS — Z6841 Body Mass Index (BMI) 40.0 and over, adult: Secondary | ICD-10-CM

## 2016-08-05 DIAGNOSIS — K219 Gastro-esophageal reflux disease without esophagitis: Secondary | ICD-10-CM | POA: Diagnosis present

## 2016-08-05 DIAGNOSIS — R531 Weakness: Secondary | ICD-10-CM | POA: Diagnosis not present

## 2016-08-05 DIAGNOSIS — R29898 Other symptoms and signs involving the musculoskeletal system: Secondary | ICD-10-CM

## 2016-08-05 DIAGNOSIS — I4891 Unspecified atrial fibrillation: Secondary | ICD-10-CM

## 2016-08-05 DIAGNOSIS — Z993 Dependence on wheelchair: Secondary | ICD-10-CM

## 2016-08-05 DIAGNOSIS — R040 Epistaxis: Secondary | ICD-10-CM | POA: Diagnosis not present

## 2016-08-05 DIAGNOSIS — R404 Transient alteration of awareness: Secondary | ICD-10-CM | POA: Diagnosis not present

## 2016-08-05 DIAGNOSIS — I110 Hypertensive heart disease with heart failure: Secondary | ICD-10-CM | POA: Diagnosis present

## 2016-08-05 DIAGNOSIS — E785 Hyperlipidemia, unspecified: Secondary | ICD-10-CM | POA: Diagnosis present

## 2016-08-05 DIAGNOSIS — Z8673 Personal history of transient ischemic attack (TIA), and cerebral infarction without residual deficits: Secondary | ICD-10-CM

## 2016-08-05 DIAGNOSIS — E079 Disorder of thyroid, unspecified: Secondary | ICD-10-CM | POA: Diagnosis present

## 2016-08-05 DIAGNOSIS — J189 Pneumonia, unspecified organism: Secondary | ICD-10-CM | POA: Diagnosis present

## 2016-08-05 DIAGNOSIS — Z9989 Dependence on other enabling machines and devices: Secondary | ICD-10-CM

## 2016-08-05 DIAGNOSIS — Z9842 Cataract extraction status, left eye: Secondary | ICD-10-CM

## 2016-08-05 DIAGNOSIS — F329 Major depressive disorder, single episode, unspecified: Secondary | ICD-10-CM | POA: Diagnosis present

## 2016-08-05 DIAGNOSIS — R791 Abnormal coagulation profile: Secondary | ICD-10-CM | POA: Diagnosis present

## 2016-08-05 DIAGNOSIS — Z9841 Cataract extraction status, right eye: Secondary | ICD-10-CM

## 2016-08-05 DIAGNOSIS — G4731 Primary central sleep apnea: Secondary | ICD-10-CM | POA: Diagnosis present

## 2016-08-05 DIAGNOSIS — T45515A Adverse effect of anticoagulants, initial encounter: Secondary | ICD-10-CM | POA: Diagnosis present

## 2016-08-05 DIAGNOSIS — Z79899 Other long term (current) drug therapy: Secondary | ICD-10-CM

## 2016-08-05 DIAGNOSIS — R0600 Dyspnea, unspecified: Secondary | ICD-10-CM | POA: Diagnosis not present

## 2016-08-05 DIAGNOSIS — I10 Essential (primary) hypertension: Secondary | ICD-10-CM | POA: Diagnosis present

## 2016-08-05 DIAGNOSIS — I509 Heart failure, unspecified: Secondary | ICD-10-CM | POA: Diagnosis present

## 2016-08-05 DIAGNOSIS — M6281 Muscle weakness (generalized): Secondary | ICD-10-CM | POA: Diagnosis not present

## 2016-08-05 DIAGNOSIS — Z86718 Personal history of other venous thrombosis and embolism: Secondary | ICD-10-CM

## 2016-08-06 ENCOUNTER — Emergency Department (HOSPITAL_COMMUNITY): Payer: Medicare Other

## 2016-08-06 ENCOUNTER — Observation Stay (HOSPITAL_BASED_OUTPATIENT_CLINIC_OR_DEPARTMENT_OTHER): Payer: Medicare Other

## 2016-08-06 ENCOUNTER — Encounter (HOSPITAL_COMMUNITY): Payer: Self-pay | Admitting: Emergency Medicine

## 2016-08-06 DIAGNOSIS — G35 Multiple sclerosis: Secondary | ICD-10-CM | POA: Diagnosis not present

## 2016-08-06 DIAGNOSIS — L8990 Pressure ulcer of unspecified site, unspecified stage: Secondary | ICD-10-CM | POA: Insufficient documentation

## 2016-08-06 DIAGNOSIS — I10 Essential (primary) hypertension: Secondary | ICD-10-CM | POA: Diagnosis not present

## 2016-08-06 DIAGNOSIS — I4891 Unspecified atrial fibrillation: Secondary | ICD-10-CM | POA: Diagnosis present

## 2016-08-06 DIAGNOSIS — G4733 Obstructive sleep apnea (adult) (pediatric): Secondary | ICD-10-CM

## 2016-08-06 DIAGNOSIS — T45511A Poisoning by anticoagulants, accidental (unintentional), initial encounter: Secondary | ICD-10-CM

## 2016-08-06 DIAGNOSIS — R29898 Other symptoms and signs involving the musculoskeletal system: Secondary | ICD-10-CM

## 2016-08-06 DIAGNOSIS — E039 Hypothyroidism, unspecified: Secondary | ICD-10-CM

## 2016-08-06 DIAGNOSIS — I428 Other cardiomyopathies: Secondary | ICD-10-CM

## 2016-08-06 DIAGNOSIS — R531 Weakness: Secondary | ICD-10-CM | POA: Diagnosis not present

## 2016-08-06 LAB — COMPREHENSIVE METABOLIC PANEL
ALBUMIN: 3.1 g/dL — AB (ref 3.5–5.0)
ALK PHOS: 51 U/L (ref 38–126)
ALT: 21 U/L (ref 14–54)
ANION GAP: 6 (ref 5–15)
AST: 19 U/L (ref 15–41)
BUN: 18 mg/dL (ref 6–20)
CALCIUM: 8.9 mg/dL (ref 8.9–10.3)
CO2: 33 mmol/L — AB (ref 22–32)
Chloride: 100 mmol/L — ABNORMAL LOW (ref 101–111)
Creatinine, Ser: 0.84 mg/dL (ref 0.44–1.00)
GFR calc Af Amer: >60 mL/min (ref >60)
GFR calc non Af Amer: >60 mL/min (ref >60)
GLUCOSE: 110 mg/dL — AB (ref 65–99)
POTASSIUM: 4.7 mmol/L (ref 3.5–5.1)
SODIUM: 139 mmol/L (ref 135–145)
Total Bilirubin: 0.7 mg/dL (ref 0.3–1.2)
Total Protein: 6.1 g/dL — ABNORMAL LOW (ref 6.5–8.1)

## 2016-08-06 LAB — CBC
HCT: 40.9 % (ref 36.0–46.0)
HEMOGLOBIN: 13.4 g/dL (ref 12.0–15.0)
MCH: 31.3 pg (ref 26.0–34.0)
MCHC: 32.8 g/dL (ref 30.0–36.0)
MCV: 95.6 fL (ref 78.0–100.0)
PLATELETS: 153 10*3/uL (ref 150–400)
RBC: 4.28 MIL/uL (ref 3.87–5.11)
RDW: 13 % (ref 11.5–15.5)
WBC: 9.4 10*3/uL (ref 4.0–10.5)

## 2016-08-06 LAB — DIFFERENTIAL
BASOS ABS: 0 10*3/uL (ref 0.0–0.1)
Basophils Relative: 0 %
EOS PCT: 2 %
Eosinophils Absolute: 0.1 10*3/uL (ref 0.0–0.7)
LYMPHS PCT: 14 %
Lymphs Abs: 1.4 10*3/uL (ref 0.7–4.0)
Monocytes Absolute: 1.3 10*3/uL — ABNORMAL HIGH (ref 0.1–1.0)
Monocytes Relative: 14 %
NEUTROS PCT: 70 %
Neutro Abs: 6.6 10*3/uL (ref 1.7–7.7)

## 2016-08-06 LAB — URINALYSIS, ROUTINE W REFLEX MICROSCOPIC
BILIRUBIN URINE: NEGATIVE
GLUCOSE, UA: NEGATIVE mg/dL
Hgb urine dipstick: NEGATIVE
KETONES UR: NEGATIVE mg/dL
Leukocytes, UA: NEGATIVE
Nitrite: NEGATIVE
Protein, ur: NEGATIVE mg/dL
Specific Gravity, Urine: 1.028 (ref 1.005–1.030)
pH: 5 (ref 5.0–8.0)

## 2016-08-06 LAB — T4, FREE: FREE T4: 1.34 ng/dL — AB (ref 0.61–1.12)

## 2016-08-06 LAB — I-STAT CHEM 8, ED
BUN: 22 mg/dL — AB (ref 6–20)
CHLORIDE: 100 mmol/L — AB (ref 101–111)
CREATININE: 0.7 mg/dL (ref 0.44–1.00)
Calcium, Ion: 1.05 mmol/L — ABNORMAL LOW (ref 1.15–1.40)
Glucose, Bld: 110 mg/dL — ABNORMAL HIGH (ref 65–99)
HEMATOCRIT: 38 % (ref 36.0–46.0)
Hemoglobin: 12.9 g/dL (ref 12.0–15.0)
POTASSIUM: 4.4 mmol/L (ref 3.5–5.1)
SODIUM: 138 mmol/L (ref 135–145)
TCO2: 29 mmol/L (ref 0–100)

## 2016-08-06 LAB — PROTIME-INR
INR: 4.36 — AB
PROTHROMBIN TIME: 42.9 s — AB (ref 11.4–15.2)

## 2016-08-06 LAB — APTT: APTT: 51 s — AB (ref 24–36)

## 2016-08-06 LAB — RAPID URINE DRUG SCREEN, HOSP PERFORMED
AMPHETAMINES: NOT DETECTED
Barbiturates: NOT DETECTED
Benzodiazepines: POSITIVE — AB
COCAINE: NOT DETECTED
Opiates: NOT DETECTED
TETRAHYDROCANNABINOL: NOT DETECTED

## 2016-08-06 LAB — MRSA PCR SCREENING: MRSA by PCR: NEGATIVE

## 2016-08-06 LAB — ECHOCARDIOGRAM COMPLETE
HEIGHTINCHES: 63 in
WEIGHTICAEL: 4470.4 [oz_av]

## 2016-08-06 LAB — TROPONIN I
Troponin I: <0.03 ng/mL (ref <0.03)
Troponin I: <0.03 ng/mL (ref <0.03)
Troponin I: 0.05 ng/mL (ref <0.03)

## 2016-08-06 LAB — I-STAT TROPONIN, ED: Troponin i, poc: 0 ng/mL (ref 0.00–0.08)

## 2016-08-06 LAB — BRAIN NATRIURETIC PEPTIDE: B Natriuretic Peptide: 137.5 pg/mL — ABNORMAL HIGH (ref 0.0–100.0)

## 2016-08-06 LAB — TSH: TSH: 1.083 u[IU]/mL (ref 0.350–4.500)

## 2016-08-06 LAB — ETHANOL: Alcohol, Ethyl (B): <5 mg/dL (ref <5)

## 2016-08-06 MED ORDER — GABAPENTIN 300 MG PO CAPS
300.0000 mg | ORAL_CAPSULE | Freq: Three times a day (TID) | ORAL | Status: DC
  Administered 2016-08-06 – 2016-08-12 (×19): 300 mg via ORAL
  Filled 2016-08-06 (×20): qty 1

## 2016-08-06 MED ORDER — CETIRIZINE HCL 10 MG PO TABS
10.0000 mg | ORAL_TABLET | Freq: Every day | ORAL | Status: DC
  Administered 2016-08-06 – 2016-08-12 (×7): 10 mg via ORAL
  Filled 2016-08-06 (×7): qty 1

## 2016-08-06 MED ORDER — NEBIVOLOL HCL 10 MG PO TABS
20.0000 mg | ORAL_TABLET | Freq: Every day | ORAL | Status: DC
  Administered 2016-08-06 – 2016-08-11 (×6): 20 mg via ORAL
  Filled 2016-08-06: qty 2
  Filled 2016-08-06: qty 4
  Filled 2016-08-06 (×3): qty 2

## 2016-08-06 MED ORDER — SODIUM CHLORIDE 0.9 % IV BOLUS (SEPSIS)
1000.0000 mL | Freq: Once | INTRAVENOUS | Status: DC
  Administered 2016-08-06: 1000 mL via INTRAVENOUS

## 2016-08-06 MED ORDER — ACETAMINOPHEN 650 MG RE SUPP: 650.0000 mg | RECTAL | Status: DC | PRN

## 2016-08-06 MED ORDER — BUPROPION HCL ER (XL) 300 MG PO TB24
300.0000 mg | ORAL_TABLET | Freq: Every day | ORAL | Status: DC
  Administered 2016-08-06 – 2016-08-12 (×7): 300 mg via ORAL
  Filled 2016-08-06 (×7): qty 1

## 2016-08-06 MED ORDER — WARFARIN - PHARMACIST DOSING INPATIENT
Freq: Every day | Status: DC
  Administered 2016-08-07 – 2016-08-11 (×5)

## 2016-08-06 MED ORDER — PANTOPRAZOLE SODIUM 40 MG PO TBEC
40.0000 mg | DELAYED_RELEASE_TABLET | Freq: Every day | ORAL | Status: DC
  Administered 2016-08-06 – 2016-08-12 (×7): 40 mg via ORAL
  Filled 2016-08-06 (×7): qty 1

## 2016-08-06 MED ORDER — SODIUM CHLORIDE 0.9 % IV SOLN: INTRAVENOUS | Status: DC

## 2016-08-06 MED ORDER — PERFLUTREN LIPID MICROSPHERE
1.0000 mL | INTRAVENOUS | Status: AC | PRN
  Administered 2016-08-06: 2 mL via INTRAVENOUS
  Filled 2016-08-06: qty 10

## 2016-08-06 MED ORDER — SIMVASTATIN 10 MG PO TABS
10.0000 mg | ORAL_TABLET | Freq: Every day | ORAL | Status: DC
  Administered 2016-08-06 – 2016-08-11 (×6): 10 mg via ORAL
  Filled 2016-08-06 (×6): qty 1

## 2016-08-06 MED ORDER — LEVOTHYROXINE SODIUM 75 MCG PO TABS
75.0000 ug | ORAL_TABLET | Freq: Every day | ORAL | Status: DC
  Administered 2016-08-06 – 2016-08-12 (×7): 75 ug via ORAL
  Filled 2016-08-06 (×7): qty 1

## 2016-08-06 MED ORDER — ACETAMINOPHEN 160 MG/5ML PO SOLN: 650.0000 mg | ORAL | Status: DC | PRN

## 2016-08-06 MED ORDER — ACETAMINOPHEN 325 MG PO TABS
650.0000 mg | ORAL_TABLET | ORAL | Status: DC | PRN
  Administered 2016-08-06 – 2016-08-12 (×9): 650 mg via ORAL
  Filled 2016-08-06 (×9): qty 2

## 2016-08-06 MED ORDER — DEXTROSE 5 % IV SOLN
5.0000 mg/h | INTRAVENOUS | Status: DC
  Administered 2016-08-06: 5 mg/h via INTRAVENOUS
  Administered 2016-08-06 (×2): 10 mg/h via INTRAVENOUS
  Filled 2016-08-06 (×3): qty 100

## 2016-08-06 MED ORDER — GABAPENTIN 300 MG PO CAPS
300.0000 mg | ORAL_CAPSULE | Freq: Once | ORAL | Status: AC
  Administered 2016-08-06: 300 mg via ORAL
  Filled 2016-08-06: qty 1

## 2016-08-06 MED ORDER — DILTIAZEM HCL 25 MG/5ML IV SOLN
5.0000 mg | Freq: Once | INTRAVENOUS | Status: AC
  Filled 2016-08-06: qty 5

## 2016-08-06 MED ORDER — ACETAMINOPHEN 325 MG PO TABS: 650.0000 mg | ORAL_TABLET | ORAL | Status: DC | PRN

## 2016-08-06 MED ORDER — DILTIAZEM LOAD VIA INFUSION
10.0000 mg | Freq: Once | INTRAVENOUS | Status: AC
  Administered 2016-08-06: 10 mg via INTRAVENOUS
  Filled 2016-08-06: qty 10

## 2016-08-06 MED ORDER — NEBIVOLOL HCL 10 MG PO TABS
20.0000 mg | ORAL_TABLET | Freq: Every day | ORAL | Status: DC
  Filled 2016-08-06: qty 4
  Filled 2016-08-06: qty 2

## 2016-08-06 MED ORDER — BACLOFEN 20 MG PO TABS
20.0000 mg | ORAL_TABLET | Freq: Two times a day (BID) | ORAL | Status: DC | PRN
  Filled 2016-08-06: qty 1

## 2016-08-06 MED ORDER — TAMSULOSIN HCL 0.4 MG PO CAPS
0.4000 mg | ORAL_CAPSULE | Freq: Every day | ORAL | Status: DC
  Administered 2016-08-06 – 2016-08-12 (×7): 0.4 mg via ORAL
  Filled 2016-08-06 (×7): qty 1

## 2016-08-06 MED ORDER — FAMOTIDINE 20 MG PO TABS
20.0000 mg | ORAL_TABLET | Freq: Every day | ORAL | Status: DC
  Administered 2016-08-06 – 2016-08-11 (×6): 20 mg via ORAL
  Filled 2016-08-06 (×6): qty 1

## 2016-08-06 MED ORDER — DILTIAZEM HCL ER COATED BEADS 120 MG PO CP24
120.0000 mg | ORAL_CAPSULE | Freq: Every day | ORAL | Status: DC
  Administered 2016-08-07 – 2016-08-12 (×6): 120 mg via ORAL
  Filled 2016-08-06 (×6): qty 1

## 2016-08-06 MED ORDER — ALPRAZOLAM 0.5 MG PO TABS
0.5000 mg | ORAL_TABLET | Freq: Two times a day (BID) | ORAL | Status: DC | PRN
  Administered 2016-08-07 – 2016-08-12 (×2): 0.5 mg via ORAL
  Filled 2016-08-06 (×2): qty 1

## 2016-08-06 MED ORDER — FUROSEMIDE 40 MG PO TABS
60.0000 mg | ORAL_TABLET | Freq: Every day | ORAL | Status: DC
  Administered 2016-08-06 – 2016-08-07 (×2): 60 mg via ORAL
  Filled 2016-08-06 (×2): qty 1

## 2016-08-06 MED ORDER — FUROSEMIDE 20 MG PO TABS
60.0000 mg | ORAL_TABLET | Freq: Once | ORAL | Status: AC
  Administered 2016-08-06: 60 mg via ORAL
  Filled 2016-08-06: qty 3

## 2016-08-06 MED ORDER — PERFLUTREN LIPID MICROSPHERE
INTRAVENOUS | Status: AC
  Administered 2016-08-06: 2 mL via INTRAVENOUS
  Filled 2016-08-06: qty 10

## 2016-08-06 NOTE — Progress Notes (Signed)
ANTICOAGULATION CONSULT NOTE - Initial Consult  Pharmacy Consult for Coumadin Indication: atrial fibrillation  Allergies  Allergen Reactions  . Hydrocodone Nausea And Vomiting    Hydrocodone causes vomiting  . Oxycodone Nausea And Vomiting    Oral oxycodone causes vomiting  . Chlorhexidine Gluconate Rash    Sores at site  . Zoloft [Sertraline] Hives, Swelling and Rash    Patient Measurements: Height: 5\' 3"  (160 cm) Weight: 260 lb (117.9 kg) IBW/kg (Calculated) : 52.4  Vital Signs: Temp: 99.2 F (37.3 C) (12/17 0000) Temp Source: Oral (12/17 0000) BP: 92/70 (12/17 0406) Pulse Rate: 145 (12/17 0406)  Labs:  Recent Labs  08/06/16 0125 08/06/16 0129  HGB 13.4 12.9  HCT 40.9 38.0  PLT 153  --   APTT 51*  --   LABPROT 42.9*  --   INR 4.36*  --   CREATININE 0.84 0.70    Estimated Creatinine Clearance: 82.4 mL/min (by C-G formula based on SCr of 0.7 mg/dL).   Medical History: Past Medical History:  Diagnosis Date  . Abnormality of gait 03/01/2015  . Anxiety   . Arthritis    "knees" (01/19/2016)  . Atrial fibrillation with RVR ([place]) 01/19/2016  . Childhood asthma   . Chronic pain    "nerve pain from the MS"  . Depression   . DVT (deep venous thrombosis) ([place]) 1983   "RLE; may have just been phlebitis; it was before the age of dopplers"  . 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 GERD (gastroesophageal reflux disease)   . History of stress test 06/21/10   limited exam with some degree of breast attenuatin of the apex however a component of apical ischemia is not excluded, EF 62%; cardiac cath   . HLD (hyperlipidemia)   . Hx of echocardiogram 04/22/10   EF 55-60%, no valve issues  . Hypertension   . Hypothyroidism   . Migraine    "none since I stopped beta blocker in 1990s" (01/19/2016)  . Multiple sclerosis ([place])    has had this may 1989-[facility] reg doc  . OSA on CPAP   . PAF (paroxysmal atrial fibrillation)  ([place])    on coumadin; documented on monitor 06/2010  . Pneumonia 11/2011  . Shortness of breath   . Thyroid disease     Assessment: 70yo female c/o right-sided weakness w/ known h/o neuropathy, found to be in Afib w/ RVR, on Coumadin for PAF, to continue Coumadin; current INR above goal w/ last dose taken 12/15.  Goal of Therapy:  INR 2-3   Plan:  Will hold Coumadin today (would expect that two held doses 12/16 and 12/17 would be sufficient unless INR trends up) and monitor INR for dose adjustments.  Wynona Neat, PharmD, BCPS  08/06/2016,5:11 AM

## 2016-08-06 NOTE — ED Provider Notes (Signed)
[facility] DEPT Provider Note   CSN: [id number] Arrival date & time: 08/05/16  2346   By signing my name below, I, Eunice Blase, attest that this documentation has been prepared under the direction and in the presence of Ezequiel Essex, MD. Electronically signed, Eunice Blase, ED Scribe. 08/06/16. 12:14 AM.   History    The history is provided by the patient. No language interpreter was used.    HPI Comments: Toni [name] is a 70 y.o. female with  who presents to the Emergency Department complaining of gradually worsening, chronic, MS-related weakness since 5 PM 08/05/2016. She states that she was unable to hold a pencil or fork in her right hand. She further notes difficulty dressing herself and increased right leg weakness from baseline. Pt notes balance issues, right sided headache behind the eye, fever, mild cough, episodic difficulty talking, difficulty drinking through a straw and gait problem. She notes Hx of stroke, A-fib and central sleep apnea. She states that she is often in A-fib, and she reports an MRI scheduled soon. Denies injury, weakness on left side, oxygen tank use at home, dizziness, Hx of heart attack, fall and swallowing difficulty.  Past Medical History:  Diagnosis Date  . Abnormality of gait 03/01/2015  . Anxiety   . Arthritis    "knees" (01/19/2016)  . Atrial fibrillation with RVR ([place]) 01/19/2016  . Childhood asthma   . Chronic pain    "nerve pain from the MS"  . Depression   . DVT (deep venous thrombosis) ([place]) 1983   "RLE; may have just been phlebitis; it was before the age of dopplers"  . 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 GERD (gastroesophageal reflux disease)   . History of stress test 06/21/10   limited exam with some degree of breast attenuatin of the apex however a component of apical ischemia is not excluded, EF 62%; cardiac cath   . HLD (hyperlipidemia)   . Hx of echocardiogram  04/22/10   EF 55-60%, no valve issues  . Hypertension   . Hypothyroidism   . Migraine    "none since I stopped beta blocker in 1990s" (01/19/2016)  . Multiple sclerosis ([place])    has had this may 1989-[facility] reg doc  . OSA on CPAP   . PAF (paroxysmal atrial fibrillation) ([place])    on coumadin; documented on monitor 06/2010  . Pneumonia 11/2011  . Shortness of breath   . Thyroid disease     Patient Active Problem List   Diagnosis Date Noted  . Nonischemic cardiomyopathy ([place]) 05/09/2016  . Localized edema 05/09/2016  . Chest pain at rest 02/03/2016  . Long term current use of anticoagulant therapy 02/03/2016  . Chest pain 01/19/2016  . Chronic atrial fibrillation ([place]) 01/19/2016  . 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 Atrial fibrillation with RVR ([place]) 01/19/2016  . Persistent atrial fibrillation with RVR ([place]) 01/19/2016  . Dysenteric diarrhea 10/04/2015  . Medication course changed 06/02/2015  . 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 Obesity hypoventilation syndrome ([place]) 03/30/2015  . Abnormality of gait 03/01/2015  . Chronic constipation with overflow incontinence 01/25/2015  . 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 Medication monitoring encounter 01/25/2015  . 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) ([place]) 01/25/2015  . 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 Acute cystitis with hematuria 08/24/2014  .  MS (multiple sclerosis) ([place]) 07/29/2014  . Closed right ankle fracture 10/30/2013  . Leucocytosis 10/30/2013  . Ankle fracture 10/30/2013  . Varicose veins of both lower extremities with complications 0000000  . Paroxysmal a-fib ([place]) 06/04/2013  . Morbid obesity ([place]) 06/04/2013  . OSA on CPAP 06/04/2013  . Muscle spasms of lower extremity 02/24/2013  . Long term (current) use of anticoagulants 11/05/2012  . Multiple sclerosis, primary chronic  progressive-diag 1989-Ab to IFN 11/26/2011  . Aspiration pneumonia vs Failed outpatient Rx of CAP 11/26/2011  . Hypertension   . Atrial fibrillation ([place])   . Thyroid disease   . HLD (hyperlipidemia)     Past Surgical History:  Procedure Laterality Date  . ANTERIOR CERVICAL DECOMP/DISCECTOMY FUSION  01/2004  . BACK SURGERY    . CARDIAC CATHETERIZATION  06/22/2010   normal coronary arteries, PAF  . CATARACT EXTRACTION, BILATERAL Bilateral   . CHILD BIRTHS     X2  . DILATION AND CURETTAGE OF UTERUS    . INFUSION PUMP IMPLANTATION  ~ 2009   baclofen infusion in lower abd  . PAIN PUMP REVISION N/A 07/29/2014   Procedure: Baclofen pump replacement;  Surgeon: Erline Levine, MD;  Location: [facility];  Service: Neurosurgery;  Laterality: N/A;  Baclofen pump replacement  . TONSILLECTOMY AND ADENOIDECTOMY    . VARICOSE VEIN SURGERY  ~ 1968  . VENOUS ABLATION  12/16/2010   radiofreq ablation -Dr Elisabeth Cara and [facility]  . WISDOM TOOTH EXTRACTION      OB History    No data available       Home Medications    Prior to Admission medications   Medication Sig Start Date End Date Taking? Authorizing Provider  ALPRAZolam Duanne Moron) 0.5 MG tablet Take 1 tablet (0.5 mg total) by mouth 2 (two) times daily as needed for anxiety. 05/04/16   Asencion Partridge Dohmeier, MD  baclofen (LIORESAL) 20 MG tablet Take 1 tablet (20 mg total) by mouth 3 (three) times daily as needed for muscle spasms. 05/04/16   Asencion Partridge Dohmeier, MD  buPROPion (WELLBUTRIN XL) 300 MG 24 hr tablet Take 300 mg by mouth daily.    Historical Provider, MD  cetirizine (ZYRTEC) 10 MG tablet Take 10 mg by mouth daily.    Historical Provider, MD  Cholecalciferol (VITAMIN D) 2000 units tablet Take 2,000 Units by mouth 2 (two) times daily.    Historical Provider, MD  co-enzyme Q-10 30 MG capsule Take 30 mg by mouth daily.    Historical Provider, MD  corticotropin (ATHACAR H.P.) 80 UNIT/ML injectable gel Inject 1 mL (80 Units total) into the skin every other  day. To achieve control of relapsing MS Symptoms. (total of 5 injections per month) 04/28/16   Kathrynn Ducking, MD  diltiazem (CARDIZEM CD) 120 MG 24 hr capsule Take 1 capsule (120 mg total) by mouth daily. 05/09/16   Pixie Casino, MD  fluticasone (FLONASE) 50 MCG/ACT nasal spray Place into both nostrils daily as needed for allergies. Use as needed for allergies 03/23/15   Historical Provider, MD  furosemide (LASIX) 40 MG tablet Take 1.5 tablets (60 mg total) by mouth daily. Take 60 mg by mouth daily 08/31/15   Pixie Casino, MD  gabapentin (NEURONTIN) 300 MG capsule Take 1 capsule by mouth up to four times daily. 11/03/15   Asencion Partridge Dohmeier, MD  GENERLAC 10 GM/15ML SOLN TAKE 7.5 ML BY MOUTH TWICE DAILY 06/20/16   Kathrynn Ducking, MD  levothyroxine (SYNTHROID, LEVOTHROID) 75 MCG tablet Take 75 mcg  by mouth daily.    Historical Provider, MD  Multiple Minerals-Vitamins (CALCIUM & VIT D3 BONE HEALTH PO) Take by mouth 2 (two) times daily. 1000/500mg     Historical Provider, MD  Nebivolol HCl (BYSTOLIC) 20 MG TABS Take 20 mg by mouth daily.    Historical Provider, MD  Omega-3 Fatty Acids (FISH OIL) 600 MG CAPS Take 600 mg by mouth daily.     Historical Provider, MD  omeprazole (PRILOSEC) 20 MG capsule Take 20 mg by mouth daily.     Historical Provider, MD  ranitidine (ZANTAC) 150 MG tablet Take 150 mg by mouth at bedtime.  12/22/12   Historical Provider, MD  simvastatin (ZOCOR) 20 MG tablet Take 10 mg by mouth at bedtime.     Historical Provider, MD  SYRINGE/NEEDLE, DISP, 1 ML (BD LUER-LOK SYRINGE) 25G X 5/8" 1 ML MISC Use as directed to inject  [place] 06/15/15   [name], MD  Tamsulosin HCl (FLOMAX) 0.4 MG CAPS Take 0.4 mg by mouth daily.    Historical Provider, MD  traMADol (ULTRAM) 50 MG tablet Take 1-2 tablets (50-100 mg total) by mouth 3 (three) times daily as needed. 07/03/16   Mcarthur Rossetti, MD  warfarin (COUMADIN) 5 MG tablet Take 1 tablet by mouth daily or as directed by coumadin  clinic. 02/08/16   Pixie Casino, MD    Family History Family History  Problem Relation Age of Onset  . Coronary artery disease Father     at age 61  . Coronary artery disease Maternal Grandmother   . Depression Maternal Grandmother   . Cancer Paternal Grandmother   . Depression Mother   . Alcoholism Son     Social History Social History  Substance Use Topics  . Smoking status: Former Smoker    Packs/day: 0.50    Years: 7.00    Types: Cigarettes    Quit date: 08/21/1976  . Smokeless tobacco: Never Used  . Alcohol use 0.6 oz/week    1 Glasses of wine per week     Allergies   Hydrocodone; Oxycodone; Chlorhexidine gluconate; and Zoloft [sertraline]   Review of Systems Review of Systems  All other systems reviewed and are negative. A complete 10 system review of systems was obtained and all systems are negative except as noted in the HPI and PMH.     Physical Exam Updated Vital Signs BP 124/67 (BP Location: Left Arm)   Resp 21   Ht 5\' 3"  (1.6 m)   Wt 260 lb (117.9 kg)   SpO2 98%   BMI 46.06 kg/m   Physical Exam  Constitutional: She is oriented to person, place, and time. She appears well-developed and well-nourished. No distress.  HENT:  Head: Normocephalic and atraumatic.  Mouth/Throat: Oropharynx is clear and moist. No oropharyngeal exudate.  Eyes: Conjunctivae and EOM are normal. Pupils are equal, round, and reactive to light.  Neck: Normal range of motion. Neck supple.  No meningismus.  Cardiovascular: Normal rate, normal heart sounds and intact distal pulses.  An irregular rhythm present.  No murmur heard. Pulmonary/Chest: Effort normal and breath sounds normal. No respiratory distress.  Abdominal: Soft. There is no tenderness. There is no rebound and no guarding.  Musculoskeletal: Normal range of motion. She exhibits no edema or tenderness.  Neurological: She is alert and oriented to person, place, and time. A sensory deficit is present. No cranial nerve  deficit. She exhibits normal muscle tone. Coordination abnormal.  Subtle weakness of right arm. Decreased grip strength and  sensation in right arm. Decreased strength in right lower extremitiy. Difficulty with finger to nose in right arm. Nl finger to nose on left. No pronator drift. Tongue midline. Gait not tested.  Skin: Skin is warm.  Psychiatric: She has a normal mood and affect. Her behavior is normal.  Nursing note and vitals reviewed.    ED Treatments / Results  DIAGNOSTIC STUDIES: Oxygen Saturation is 98% on RA, normal by my interpretation.    COORDINATION OF CARE: 12:14 AM Discussed treatment plan with pt at bedside and pt agreed to plan.  Labs (all labs ordered are listed, but only abnormal results are displayed) Labs Reviewed  PROTIME-INR - Abnormal; Notable for the following:       Result Value   Prothrombin Time 42.9 (*)    INR 4.36 (*)    All other components within normal limits  APTT - Abnormal; Notable for the following:    aPTT 51 (*)    All other components within normal limits  DIFFERENTIAL - Abnormal; Notable for the following:    Monocytes Absolute 1.3 (*)    All other components within normal limits  COMPREHENSIVE METABOLIC PANEL - Abnormal; Notable for the following:    Chloride 100 (*)    CO2 33 (*)    Glucose, Bld 110 (*)    Total Protein 6.1 (*)    Albumin 3.1 (*)    All other components within normal limits  RAPID URINE DRUG SCREEN, HOSP PERFORMED - Abnormal; Notable for the following:    Benzodiazepines POSITIVE (*)    All other components within normal limits  BRAIN NATRIURETIC PEPTIDE - Abnormal; Notable for the following:    B Natriuretic Peptide 137.5 (*)    All other components within normal limits  T4, FREE - Abnormal; Notable for the following:    Free T4 1.34 (*)    All other components within normal limits  I-STAT CHEM 8, ED - Abnormal; Notable for the following:    Chloride 100 (*)    BUN 22 (*)    Glucose, Bld 110 (*)    Calcium,  Ion 1.05 (*)    All other components within normal limits  MRSA PCR SCREENING  ETHANOL  CBC  URINALYSIS, ROUTINE W REFLEX MICROSCOPIC  TSH  TROPONIN I  TROPONIN I  TROPONIN I  PROTIME-INR  COMPREHENSIVE METABOLIC PANEL  CBC  I-STAT TROPOININ, ED    EKG  EKG Interpretation  Date/Time:  Sunday August 06 2016 00:21:47 EST Ventricular Rate:  118 PR Interval:    QRS Duration: 98 QT Interval:  321 QTC Calculation: 421 R Axis:   80 Text Interpretation:  Atrial fibrillation Paired ventricular premature complexes No significant change was found Confirmed by Wyvonnia Dusky  MD, Callee Rohrig ([id number]) on 08/06/2016 12:28:50 AM       Radiology No results found.  Procedures Procedures (including critical care time)  Medications Ordered in ED Medications - No data to display   Initial Impression / Assessment and Plan / ED Course  I have reviewed the triage vital signs and the nursing notes.  Pertinent labs & imaging results that were available during my care of the patient were reviewed by me and considered in my medical decision making (see chart for details).  Clinical Course    Patient with history of MS, atrial fibrillation on Coumadin presenting with right-sided arm weakness and difficulty with fine motor skills area and states this is been progressively worsening since about 5 PM. Felt that she was having trouble  with her balance and some weakness in her right leg as well. Code stroke not activated as last seen normal was 7 hours ago and patient is already anticoagulated.  Discussed with Dr. Cheral Marker of neurology. CT head negative. INR elevated.   Dr Cheral Marker feels this could be MS exacerbation or TIA. Patient states cannot have closed MRI. She is also hesitant to start steroids due to side effects in the past.   Her Afib is poorly controlled with rates into the 150s.  Cardizem gtt started after bolus.  Admission d.w Dr. Eulas Post.   CRITICAL CARE Performed by: Ezequiel Essex Total critical care time: 45 minutes Critical care time was exclusive of separately billable procedures and treating other patients. Critical care was necessary to treat or prevent imminent or life-threatening deterioration. Critical care was time spent personally by me on the following activities: development of treatment plan with patient and/or surrogate as well as nursing, discussions with consultants, evaluation of patient's response to treatment, examination of patient, obtaining history from patient or surrogate, ordering and performing treatments and interventions, ordering and review of laboratory studies, ordering and review of radiographic studies, pulse oximetry and re-evaluation of patient's condition.  Final Clinical Impressions(s) / ED Diagnoses   Final diagnoses:  Atrial fibrillation with RVR (HCC)  RUE weakness  Dyspnea    New Prescriptions New Prescriptions   No medications on file  I personally performed the services described in this documentation, which was scribed in my presence. The recorded information has been reviewed and is accurate.    Ezequiel Essex, MD 08/06/16 [phone]

## 2016-08-06 NOTE — Progress Notes (Signed)
  Echocardiogram 2D Echocardiogram has been performed.  Toni [name] 08/06/2016, 9:23 AM

## 2016-08-06 NOTE — Evaluation (Signed)
Physical Therapy Evaluation Patient Details Name: Toni [name] MRN: [id number] DOB: 1945-11-29 Today's Date: 08/06/2016   History of Present Illness  70 y.o.woman with PAF (CHADS-VASc 4, anticoagulated with warfarin), multiple sclerosis (followed by Dr. Brett Fairy), central sleep apnea on BiPAP, HTN, GERD, and hypothyroidism who developed the acute onset of right hand weakness with decreased dexterity w/ transient word finding difficulty.  She denied facial droop or other focal deficits. She has chronic RLE weakness which she attributes to her MS and felt this was unchanged. She was in atrial fibrillation with RVR, HR as high as 145.   Clinical Impression   Pt admitted with above diagnosis. Pt currently with functional limitations due to the deficits listed below (see PT Problem List). Able to transfer with min assist (second person helpful for safety, lines, leads, etc.); Weaker than prior to this admission; worth getting HHPT follow up;  Pt will benefit from skilled PT to increase their independence and safety with mobility to allow discharge to the venue listed below.       Follow Up Recommendations Home health PT;Supervision/Assistance - 24 hour    Equipment Recommendations  None recommended by PT (pretty well-equipped)    Recommendations for Other Services       Precautions / Restrictions Precautions Precautions: Fall      Mobility  Bed Mobility Overal bed mobility: Needs Assistance Bed Mobility: Supine to Sit     Supine to sit: Min assist     General bed mobility comments: Min handheld assist to pull to sit; Good scoot to EOB in prep for getting up, though movement was inefficient  Transfers Overall transfer level: Needs assistance Equipment used: 2 person hand held assist Transfers: Sit to/from [facility] Sit to Stand: Min assist;+2 safety/equipment Stand pivot transfers: Min assist;+2 safety/equipment       General transfer comment: Min  assist to steady; Cues for technqiue and hand placement during transition bed to chair  Ambulation/Gait                Stairs            Wheelchair Mobility    Modified Rankin (Stroke Patients Only)       Balance Overall balance assessment: Needs assistance   Sitting balance-Leahy Scale: Fair                                       Pertinent Vitals/Pain Pain Assessment: No/denies pain    Home Living Family/patient expects to be discharged to:: Private residence Living Arrangements: Spouse/significant other Available Help at Discharge: Family;Available 24 hours/day Type of Home: House Home Access: Ramped entrance     Home Layout: Two level;Able to live on main level with bedroom/bathroom Home Equipment: Gilford Rile - 2 wheels;Wheelchair - manual;Grab bars - tub/shower;Shower seat - built in;Hand held shower head (lift chair)      Prior Function Level of Independence: Needs assistance   Gait / Transfers Assistance Needed: walks household distances with RW and wheelchair push behind her           Hand Dominance   Dominant Hand: Right    Extremity/Trunk Assessment   Upper Extremity Assessment Upper Extremity Assessment: Generalized weakness;Defer to OT evaluation    Lower Extremity Assessment Lower Extremity Assessment: Generalized weakness (RLE weaker than LLE premorbidly)    Cervical / Trunk Assessment Cervical / Trunk Assessment:  (limited rotation due to body habitus)  Communication   Communication: No difficulties  Cognition Arousal/Alertness: Awake/alert Behavior During Therapy: WFL for tasks assessed/performed Overall Cognitive Status: Within Functional Limits for tasks assessed                      General Comments General comments (skin integrity, edema, etc.): HR ranged 90-133 bpm during session    Exercises     Assessment/Plan    PT Assessment Patient needs continued PT services  PT Problem List Decreased  strength;Decreased range of motion;Decreased activity tolerance;Decreased balance;Decreased mobility;Decreased coordination;Decreased knowledge of use of DME;Cardiopulmonary status limiting activity          PT Treatment Interventions DME instruction;Gait training;Functional mobility training;Therapeutic activities;Therapeutic exercise;Balance training;Patient/family education    PT Goals (Current goals can be found in the Care Plan section)  Acute Rehab PT Goals Patient Stated Goal: get back to walking PT Goal Formulation: With patient Time For Goal Achievement: 08/20/16 Potential to Achieve Goals: Good    Frequency Min 3X/week   Barriers to discharge        Co-evaluation               End of Session Equipment Utilized During Treatment:  (bed pad) Activity Tolerance: Patient tolerated treatment well Patient left: in chair;with call bell/phone within reach Nurse Communication: Mobility status    Functional Assessment Tool Used: Clinical Judgement Functional Limitation: Mobility: Walking and moving around Mobility: Walking and Moving Around Current Status [id number]): At least 1 percent but less than 20 percent impaired, limited or restricted Mobility: Walking and Moving Around Goal Status [phone]): 0 percent impaired, limited or restricted    Time: 1111-1127 PT Time Calculation (min) (ACUTE ONLY): 16 min   Charges:   PT Evaluation $PT Eval Moderate Complexity: 1 Procedure     PT G Codes:   PT G-Codes **NOT FOR INPATIENT CLASS** Functional Assessment Tool Used: Clinical Judgement Functional Limitation: Mobility: Walking and moving around Mobility: Walking and Moving Around Current Status [id number]): At least 1 percent but less than 20 percent impaired, limited or restricted Mobility: Walking and Moving Around Goal Status [phone]): 0 percent impaired, limited or restricted    Colletta [place] 08/06/2016, 2:18 PM  Roney Marion, [place] Pager [phone] Office [phone]

## 2016-08-06 NOTE — Consult Note (Signed)
NEURO HOSPITALIST CONSULT NOTE   Requestig physician: Dr. Wyvonnia Dusky  Reason for Consult: New onset RUE weakness  History obtained from:  Patient and Chart     HPI:                                                                                                                                          Toni [name] is an 70 y.o. female with multiple sclerosis who presents with a 2 day history of loss of dexterity in her right hand. Has had trouble holding a pencil and a fork, as well as trouble dressing. Also states that her entire right arm and right leg have to an extent gotten weaker as well. Also complains of sensory numbness on the right. Her symptoms spare her face. She feels that the symptoms represent an exacerbation of previously present sensory numbness and weakness on the right. She states "my right side has been weaker than my left for a long time". She also complains of balance problems. She is wheelchair bound at home, except during PT sessions when she can walk up to 15 feet with assistance. She denies confusion, vision loss, chest pain, or SOB. Has mild cough and postnasal drip. Also endorses right sided periorbital headache, distal lower extremity paresthesia and neuropathic pain, postnasal drip and recent onset of fever. She is unsure if the fever preceded her worsened motor/sensory symptoms or if it came afterwards.   Her PMHx includes atrial fibrillation on Coumadin, stroke, sleep apnea, morbid obesity, arthritis and implanted Baclofen pump.   She states that she has had side effects from Solumedrol in the past, including elevated blood sugars and tachycardia - "I am worried that it will make my heart rate problems from my atrial fibrillation worse".   She is on disease modifying therapy for her MS: Acthar (ACTH-containing extended-release gel) 5 times per month.    Past Medical History:  Diagnosis Date  . Abnormality of gait 03/01/2015  . Anxiety   .  Arthritis    "knees" (01/19/2016)  . Atrial fibrillation with RVR ([place]) 01/19/2016  . Childhood asthma   . Chronic pain    "nerve pain from the MS"  . Depression   . DVT (deep venous thrombosis) ([place]) 1983   "RLE; may have just been phlebitis; it was before the age of dopplers"  . 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 GERD (gastroesophageal reflux disease)   . History of stress test 06/21/10   limited exam with some degree of breast attenuatin of the apex however a component of apical ischemia is not excluded, EF 62%; cardiac cath   . HLD (hyperlipidemia)   . Hx of echocardiogram 04/22/10  EF 55-60%, no valve issues  . Hypertension   . Hypothyroidism   . Migraine    "none since I stopped beta blocker in 1990s" (01/19/2016)  . Multiple sclerosis ([place])    has had this may 1989-[facility] reg doc  . OSA on CPAP   . PAF (paroxysmal atrial fibrillation) ([place])    on coumadin; documented on monitor 06/2010  . Pneumonia 11/2011  . Shortness of breath   . Thyroid disease     Past Surgical History:  Procedure Laterality Date  . ANTERIOR CERVICAL DECOMP/DISCECTOMY FUSION  01/2004  . BACK SURGERY    . CARDIAC CATHETERIZATION  06/22/2010   normal coronary arteries, PAF  . CATARACT EXTRACTION, BILATERAL Bilateral   . CHILD BIRTHS     X2  . DILATION AND CURETTAGE OF UTERUS    . INFUSION PUMP IMPLANTATION  ~ 2009   baclofen infusion in lower abd  . PAIN PUMP REVISION N/A 07/29/2014   Procedure: Baclofen pump replacement;  Surgeon: Erline Levine, MD;  Location: [facility];  Service: Neurosurgery;  Laterality: N/A;  Baclofen pump replacement  . TONSILLECTOMY AND ADENOIDECTOMY    . VARICOSE VEIN SURGERY  ~ 1968  . VENOUS ABLATION  12/16/2010   radiofreq ablation -Dr Elisabeth Cara and [facility]  . WISDOM TOOTH EXTRACTION      Family History  Problem Relation Age of Onset  . Coronary artery disease Father     at age 12  . Coronary artery disease  Maternal Grandmother   . Depression Maternal Grandmother   . Cancer Paternal Grandmother   . Depression Mother   . Alcoholism Son     Social History:  reports that she quit smoking about 39 years ago. Her smoking use included Cigarettes. She has a 3.50 pack-year smoking history. She has never used smokeless tobacco. She reports that she drinks about 0.6 oz of alcohol per week . She reports that she does not use drugs.  Allergies  Allergen Reactions  . Hydrocodone Nausea And Vomiting    Hydrocodone causes vomiting  . Oxycodone     Oral oxycodone causes vomiting  . Chlorhexidine Gluconate     Sores at site  . Zoloft [Sertraline] Hives, Swelling and Rash    MEDICATIONS:                                                                                                                     No current facility-administered medications on file prior to encounter.    Current Outpatient Prescriptions on File Prior to Encounter  Medication Sig Dispense Refill  . ALPRAZolam (XANAX) 0.5 MG tablet Take 1 tablet (0.5 mg total) by mouth 2 (two) times daily as needed for anxiety. 180 tablet 1  . baclofen (LIORESAL) 20 MG tablet Take 1 tablet (20 mg total) by mouth 3 (three) times daily as needed for muscle spasms. (Patient taking differently: Take 20 mg by mouth See admin instructions. Once to twice a day for breakthrough spasms) 180 each 3  .  buPROPion (WELLBUTRIN XL) 300 MG 24 hr tablet Take 300 mg by mouth daily.    . cetirizine (ZYRTEC) 10 MG tablet Take 10 mg by mouth daily.    . Cholecalciferol (VITAMIN D) 2000 units tablet Take 2,000 Units by mouth 2 (two) times daily.    Marland Kitchen co-enzyme Q-10 30 MG capsule Take 30 mg by mouth daily.    . corticotropin (ATHACAR H.P.) 80 UNIT/ML injectable gel Inject 1 mL (80 Units total) into the skin every other day. To achieve control of relapsing MS Symptoms. (total of 5 injections per month) 5 mL 6  . diltiazem (CARDIZEM CD) 120 MG 24 hr capsule Take 1 capsule (120  mg total) by mouth daily. 90 capsule 3  . fluticasone (FLONASE) 50 MCG/ACT nasal spray Place 2 sprays into both nostrils daily as needed for allergies.     . furosemide (LASIX) 40 MG tablet Take 1.5 tablets (60 mg total) by mouth daily. Take 60 mg by mouth daily (Patient taking differently: Take 60 mg by mouth daily. May take an additional 40 mg as needed if edema is still present) 135 tablet 3  . gabapentin (NEURONTIN) 300 MG capsule Take 1 capsule by mouth up to four times daily. (Patient taking differently: Take 300 mg by mouth See admin instructions. Three to four times a day) 360 capsule 3  . GENERLAC 10 GM/15ML SOLN TAKE 7.5 ML BY MOUTH TWICE DAILY 900 mL 5  . levothyroxine (SYNTHROID, LEVOTHROID) 75 MCG tablet Take 75 mcg by mouth daily.    . Nebivolol HCl (BYSTOLIC) 20 MG TABS Take 20 mg by mouth daily.    Marland Kitchen omeprazole (PRILOSEC) 20 MG capsule Take 20 mg by mouth daily.     . ranitidine (ZANTAC) 150 MG tablet Take 150 mg by mouth at bedtime.     . simvastatin (ZOCOR) 20 MG tablet Take 10 mg by mouth at bedtime.     . SYRINGE/NEEDLE, DISP, 1 ML (BD LUER-LOK SYRINGE) 25G X 5/8" 1 ML MISC Use as directed to inject  Acthar 10 each prn  . Tamsulosin HCl (FLOMAX) 0.4 MG CAPS Take 0.4 mg by mouth daily.    Marland Kitchen warfarin (COUMADIN) 5 MG tablet Take 1 tablet by mouth daily or as directed by coumadin clinic. (Patient taking differently: Take 2.5-5 mg by mouth See admin instructions. 2.5 mg at bedtime on Sun/Wed and 5 mg on Mon/Tues/Thurs/Fri/Sat) 90 tablet 2  . traMADol (ULTRAM) 50 MG tablet Take 1-2 tablets (50-100 mg total) by mouth 3 (three) times daily as needed. (Patient not taking: Reported on 08/06/2016) 60 tablet 0    ROS:                                                                                                                                       As documented in HPI.    Blood pressure 124/90, pulse (!) 121, temperature 99.2 F (37.3 C),  temperature source Oral, resp. rate 16, height  5\' 3"  (1.6 m), weight 117.9 kg (260 lb), SpO2 98 %.   Neurologic Examination:                                                                                                      HEENT-  Normocephalic/atraumatic.  Abdomen- morbidly obese Extremities- Warm and well perfused  Neurological Examination Mental Status: Alert, oriented, thought content appropriate.  Speech fluent without evidence of aphasia.  Able to follow all commands without difficulty. Cranial Nerves: II: Visual fields intact to confrontation, PERRL III,IV, VI: ptosis not present, EOMI without nystagmus V,VII: smile symmetric, facial temperature sensation normal bilaterally VIII: hearing intact to conversation IX,X: no hypophonia or hoarseness [id number] XII: midline tongue extension Motor: RUE: 4/5 deltoid, triceps, biceps. 4-/5 wrist flexion/extension. 3/5 finger abduction and grip.  RLE: 2/5 hip flexion, 3/5 knee extension, 4-/5 ankle dorsi/plantar flexion LUE and LLE: 5/5 Sensory: Decreased fine touch sensation to RUE and RLE. Temperature sensation normal x 4.  Deep Tendon Reflexes: Hypoactive throughout in the context of adiposity limiting evaluation.  Plantars: Equivocal.  Cerebellar: No ataxia with FNF bilaterally.  Gait: Deferred due to falls risk concerns.    Lab Results: Basic Metabolic Panel: No results for input(s): NA, K, CL, CO2, GLUCOSE, BUN, CREATININE, CALCIUM, MG, PHOS in the last 168 hours.  Liver Function Tests: No results for input(s): AST, ALT, ALKPHOS, BILITOT, PROT, ALBUMIN in the last 168 hours. No results for input(s): LIPASE, AMYLASE in the last 168 hours. No results for input(s): AMMONIA in the last 168 hours.  CBC: No results for input(s): WBC, NEUTROABS, HGB, HCT, MCV, PLT in the last 168 hours.  Cardiac Enzymes: No results for input(s): CKTOTAL, CKMB, CKMBINDEX, TROPONINI in the last 168 hours.  Lipid Panel: No results for input(s): CHOL, TRIG, HDL, CHOLHDL, VLDL, LDLCALC in  the last 168 hours.  CBG: No results for input(s): GLUCAP in the last 168 hours.  Microbiology: Results for orders placed or performed in visit on 12/18/14  Microscopic Examination     Status: None   Collection Time: 12/18/14 11:56 AM  Result Value Ref Range Status   WBC, UA 0-5 0 - 5 /hpf Final   RBC, UA 0-2 0 - 2 /hpf Final   Epithelial Cells (non renal) 0-10 0 - 10 /hpf Final   Casts None seen None seen /lpf Final   Mucus, UA Present Not Estab. Final   Bacteria, UA None seen None seen/Few Final    Coagulation Studies: No results for input(s): LABPROT, INR in the last 72 hours.  Imaging: CT reveals no acute abnormality. Moderate white matter changes can be seen, consistent with patient's reported diagnosis of MS.  Marland Kitchen  Assessment: 1. Subacute worsening of right sided weakness and sensory numbness, worse in the hand and sparing the face. Findings best localize as a small right sided cervical spinal cord lesion. 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 CT head exhibits findings compatible with her diagnosis of multiple sclerosis. Her presentation is unlikely to be due to stroke given gradual onset of symptoms.  2. Fever with cough.  3. Atrial fibrillation on Coumadin.  4. History of stroke. No large vessel stroke is seen on CT head. 5. Unable to tolerate conventional MRI due to claustrophobia and morbid obesity.    Recommendations: 1. If able, obtain open MRI for imaging of brain and cervical spinal cord with and without contrast.  2. Consider IV Solumedrol. Patient is unsure if she would like to be treated given side effects related to hyperglycemia and tachycardia. Also, given that infection with fever precipitating Uhthoff phenomenon is also a possibility, the most conservative route may be to evaluate for possible sources of infection and treat  with antibiotics if indicated.  3. PT/OT/Speech.  4. Continue Coumadin.   Electronically signed: Dr. Kerney Elbe 08/06/2016, 1:10 AM

## 2016-08-06 NOTE — H&P (Signed)
History and Physical    TAUNDRA LEGLEITER [id number] DOB: August 17, 1946 DOA: 08/05/2016  PCP: [name], MD  Cardiology: [facility] Neurology: [facility], Asencion Partridge Dohmeier  Patient coming from: Home  Chief Complaint: Right hand weakness  HPI: Toni [name] is a 70 y.o. woman with multiple comorbidities including PAF (CHADS-VASc 4, anticoagulated with warfarin), multiple sclerosis (followed by Dr. Brett Fairy), central sleep apnea now on BiPAP, HTN, GERD, and hypothyroidism who feels that she was in her baseline state of health until about 5PM last evening.  She was attempting to do a crossword puzzle when she realized that she had new right hand weakness with decreased dexterity.  She thinks she may have had transient word finding difficulty, but she denies facial droop or other focal deficits.  She has chronic RLE weakness which she attributes to her MS and feels this is unchanged.  She has had a mild headache and worsening visual deficits (which flair up intermittently with her MS).  She complains of fatigue.   She has mild DOE but feels that her leg swelling is stable.  She denies palpitations.  No chest pain, pressure, or tightness.  ED Course: Code Stroke was not call because the patient was last normal 7 hours ago by the time she was being evaluated in the ED.  Head CT negative for acute process.  Neurology has been consulted.  She is also in atrial fibrillation with RVR, HR as high as 145 while I am at the bedside.  Cardizem 15mg  IV bolus pending, to be followed by titratable cardizem infusion per protocol.  The patient is requesting cardiology consultation.  She is not willing to have an MRI done because she cannot tolerate the closed scanner.  She has an outpatient MRI scheduled in the open scanner later this month.  She is also stating that she does not want IV steroids until her heart rate is under better control.  Hospitalist asked to assess this patient.  Orders for IV  fluids discontinued at time of admission.  Chest xray and BNP added to initial work-up in the ED.  Review of Systems: As per HPI otherwise 10 point review of systems negative.    Past Medical History:  Diagnosis Date  . Abnormality of gait 03/01/2015  . Anxiety   . Arthritis    "knees" (01/19/2016)  . Atrial fibrillation with RVR ([place]) 01/19/2016  . Childhood asthma   . Chronic pain    "nerve pain from the MS"  . Depression   . DVT (deep venous thrombosis) ([place]) 1983   "RLE; may have just been phlebitis; it was before the age of dopplers"  . 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 GERD (gastroesophageal reflux disease)   . History of stress test 06/21/10   limited exam with some degree of breast attenuatin of the apex however a component of apical ischemia is not excluded, EF 62%; cardiac cath   . HLD (hyperlipidemia)   . Hx of echocardiogram 04/22/10   EF 55-60%, no valve issues  . Hypertension   . Hypothyroidism   . Migraine    "none since I stopped beta blocker in 1990s" (01/19/2016)  . Multiple sclerosis ([place])    has had this may 1989-[facility] reg doc  . OSA on CPAP   . PAF (paroxysmal atrial fibrillation) ([place])    on coumadin; documented on monitor 06/2010  . Pneumonia 11/2011  . Shortness  of breath   . Thyroid disease     Past Surgical History:  Procedure Laterality Date  . ANTERIOR CERVICAL DECOMP/DISCECTOMY FUSION  01/2004  . BACK SURGERY    . CARDIAC CATHETERIZATION  06/22/2010   normal coronary arteries, PAF  . CATARACT EXTRACTION, BILATERAL Bilateral   . CHILD BIRTHS     X2  . DILATION AND CURETTAGE OF UTERUS    . INFUSION PUMP IMPLANTATION  ~ 2009   baclofen infusion in lower abd  . PAIN PUMP REVISION N/A 07/29/2014   Procedure: Baclofen pump replacement;  Surgeon: Erline Levine, MD;  Location: [facility];  Service: Neurosurgery;  Laterality: N/A;  Baclofen pump replacement  . TONSILLECTOMY AND ADENOIDECTOMY     . VARICOSE VEIN SURGERY  ~ 1968  . VENOUS ABLATION  12/16/2010   radiofreq ablation -Dr Elisabeth Cara and [facility]  . WISDOM TOOTH EXTRACTION       reports that she quit smoking about 39 years ago. Her smoking use included Cigarettes. She has a 3.50 pack-year smoking history. She has never used smokeless tobacco. She reports that she drinks about 0.6 oz of alcohol per week . She reports that she does not use drugs.  She reports "rare" EtOH use.  She is married.  She has two children.  Her husband is her medical POA.  Allergies  Allergen Reactions  . Hydrocodone Nausea And Vomiting    Hydrocodone causes vomiting  . Oxycodone Nausea And Vomiting    Oral oxycodone causes vomiting  . Chlorhexidine Gluconate Rash    Sores at site  . Zoloft [Sertraline] Hives, Swelling and Rash    Family History  Problem Relation Age of Onset  . Coronary artery disease Father     at age 42  . Coronary artery disease Maternal Grandmother   . Depression Maternal Grandmother   . Cancer Paternal Grandmother   . Depression Mother   . Alcoholism Son      Prior to Admission medications   Medication Sig Start Date End Date Taking? Authorizing Provider  SYRINGE/NEEDLE, DISP, 1 ML (BD LUER-LOK SYRINGE) 25G X 5/8" 1 ML MISC Use as directed to inject  [place] 06/15/15  Yes [name], MD  ALPRAZolam Duanne Moron) 0.5 MG tablet Take 1 tablet (0.5 mg total) by mouth 2 (two) times daily as needed for anxiety. 05/04/16   Asencion Partridge Dohmeier, MD  baclofen (LIORESAL) 20 MG tablet Take 1 tablet (20 mg total) by mouth 3 (three) times daily as needed for muscle spasms. 05/04/16   Asencion Partridge Dohmeier, MD  buPROPion (WELLBUTRIN XL) 300 MG 24 hr tablet Take 300 mg by mouth daily.    Historical Provider, MD  cetirizine (ZYRTEC) 10 MG tablet Take 10 mg by mouth daily.    Historical Provider, MD  Cholecalciferol (VITAMIN D) 2000 units tablet Take 2,000 Units by mouth 2 (two) times daily.    Historical Provider, MD  co-enzyme Q-10 30 MG capsule  Take 30 mg by mouth daily.    Historical Provider, MD  corticotropin (ATHACAR H.P.) 80 UNIT/ML injectable gel Inject 1 mL (80 Units total) into the skin every other day. To achieve control of relapsing MS Symptoms. (total of 5 injections per month) Patient taking differently: Inject 80 Units into the skin See admin instructions. 5 days in a row monthly/To achieve control of relapsing MS Symptoms. (total of 5 injections per month) 04/28/16   Kathrynn Ducking, MD  diltiazem (CARDIZEM CD) 120 MG 24 hr capsule Take 1 capsule (120 mg total) by  mouth daily. 05/09/16   Pixie Casino, MD  fluticasone (FLONASE) 50 MCG/ACT nasal spray Place into both nostrils daily as needed for allergies. Use as needed for allergies 03/23/15   Historical Provider, MD  furosemide (LASIX) 40 MG tablet Take 1.5 tablets (60 mg total) by mouth daily. Take 60 mg by mouth daily Patient taking differently: Take 60 mg by mouth daily. May take an additional 40 mg as needed if edema is still present 08/31/15   Pixie Casino, MD  gabapentin (NEURONTIN) 300 MG capsule Take 1 capsule by mouth up to four times daily. 11/03/15   Asencion Partridge Dohmeier, MD  GENERLAC 10 GM/15ML SOLN TAKE 7.5 ML BY MOUTH TWICE DAILY 06/20/16   Kathrynn Ducking, MD  levothyroxine (SYNTHROID, LEVOTHROID) 75 MCG tablet Take 75 mcg by mouth daily.    Historical Provider, MD  Multiple Minerals-Vitamins (CALCIUM & VIT D3 BONE HEALTH PO) Take by mouth 2 (two) times daily. 1000/500mg     Historical Provider, MD  Nebivolol HCl (BYSTOLIC) 20 MG TABS Take 20 mg by mouth daily.    Historical Provider, MD  Omega-3 Fatty Acids (FISH OIL) 600 MG CAPS Take 600 mg by mouth daily.     Historical Provider, MD  omeprazole (PRILOSEC) 20 MG capsule Take 20 mg by mouth daily.     Historical Provider, MD  ranitidine (ZANTAC) 150 MG tablet Take 150 mg by mouth at bedtime.  12/22/12   Historical Provider, MD  simvastatin (ZOCOR) 20 MG tablet Take 10 mg by mouth at bedtime.     Historical Provider, MD   Tamsulosin HCl (FLOMAX) 0.4 MG CAPS Take 0.4 mg by mouth daily.    Historical Provider, MD  traMADol (ULTRAM) 50 MG tablet Take 1-2 tablets (50-100 mg total) by mouth 3 (three) times daily as needed. 07/03/16   Mcarthur Rossetti, MD  warfarin (COUMADIN) 5 MG tablet Take 1 tablet by mouth daily or as directed by coumadin clinic. 02/08/16   Pixie Casino, MD    Physical Exam: Vitals:   08/06/16 0100 08/06/16 0130 08/06/16 0240 08/06/16 0406  BP: 124/90 137/89 139/75 92/70  Pulse:    (!) 145  Resp: 16 17 20 20   Temp:      TempSrc:      SpO2:    100%  Weight:      Height:          Constitutional: NAD, anxious but she is not acutely decompensating Vitals:   08/06/16 0100 08/06/16 0130 08/06/16 0240 08/06/16 0406  BP: 124/90 137/89 139/75 92/70  Pulse:    (!) 145  Resp: 16 17 20 20   Temp:      TempSrc:      SpO2:    100%  Weight:      Height:       Eyes: PERRL, lids and conjunctivae normal ENMT: Mucous membranes are slightly dry. Posterior pharynx clear of any exudate or lesions. Normal dentition.  Neck: neck is thick but supple Respiratory: clear to auscultation bilaterally, no wheezing, no crackles. Normal respiratory effort. No accessory muscle use.  Cardiovascular: Tachycardic and irregular.  No murmurs / rubs / gallops. No significant pitting.  Chronic appearing lymphedema.  2+ pedal pulses. GI: abdomen is obese but soft and compressible.  No distention.  No tenderness.  Bowel sounds are present. Musculoskeletal:  No joint deformity in upper and lower extremities. No contractures. Normal muscle tone.  Skin: Pale, cool, multiple bruises present. Neurologic: CN 2-12 grossly intact. Sensation intact, Right  leg is significantly weaker than the left.  Still having difficulty with fine motor coordination in her right hand.  Grip strength feels comparable to the left. Psychiatric: Normal judgment and insight. Alert and oriented x 3. Normal mood.     Labs on Admission: I  have personally reviewed following labs and imaging studies  CBC:  Recent Labs Lab 08/06/16 0125 08/06/16 0129  WBC 9.4  --   NEUTROABS 6.6  --   HGB 13.4 12.9  HCT 40.9 38.0  MCV 95.6  --   PLT 153  --    Basic Metabolic Panel:  Recent Labs Lab 08/06/16 0125 08/06/16 0129  NA 139 138  K 4.7 4.4  CL 100* 100*  CO2 33*  --   GLUCOSE 110* 110*  BUN 18 22*  CREATININE 0.84 0.70  CALCIUM 8.9  --    GFR: Estimated Creatinine Clearance: 82.4 mL/min (by C-G formula based on SCr of 0.7 mg/dL). Liver Function Tests:  Recent Labs Lab 08/06/16 0125  AST 19  ALT 21  ALKPHOS 51  BILITOT 0.7  PROT 6.1*  ALBUMIN 3.1*   Coagulation Profile:  Recent Labs Lab 08/06/16 0125  INR 4.36*   Urine analysis:    Component Value Date/Time   COLORURINE YELLOW 08/06/2016 0243   APPEARANCEUR CLEAR 08/06/2016 0243   APPEARANCEUR Clear 12/18/2014 1156   LABSPEC 1.028 08/06/2016 0243   PHURINE 5.0 08/06/2016 0243   GLUCOSEU NEGATIVE 08/06/2016 0243   HGBUR NEGATIVE 08/06/2016 0243   BILIRUBINUR NEGATIVE 08/06/2016 0243   BILIRUBINUR Negative 12/18/2014 1156   KETONESUR NEGATIVE 08/06/2016 0243   PROTEINUR NEGATIVE 08/06/2016 0243   UROBILINOGEN 0.2 08/05/2014 0951   NITRITE NEGATIVE 08/06/2016 0243   LEUKOCYTESUR NEGATIVE 08/06/2016 0243   LEUKOCYTESUR 1+ (A) 12/18/2014 1156    Radiological Exams on Admission: Ct Head Wo Contrast  Result Date: 08/06/2016 CLINICAL DATA:  RIGHT arm weakness. History of multiple sclerosis and hypertension, cervical spine surgery. EXAM: CT HEAD WITHOUT CONTRAST TECHNIQUE: Contiguous axial images were obtained from the base of the skull through the vertex without intravenous contrast. COMPARISON:  CT HEAD April 21, 2016 FINDINGS: BRAIN: The ventricles and sulci are normal for age. Similar tiny fat density within the frontal horns. No intraparenchymal hemorrhage, mass effect nor midline shift. Patchy supratentorial white matter hypodensities  within normal range for patient's age, though non-specific are most compatible with chronic small vessel ischemic disease, similar to progressed. No acute large vascular territory infarcts. No abnormal extra-axial fluid collections. Basal cisterns are patent. VASCULAR: Mild calcific atherosclerosis of the carotid siphons. SKULL: No skull fracture. No significant scalp soft tissue swelling. SINUSES/ORBITS: Small paranasal sinus mucosal retention cyst. The mastoid air-cells and included paranasal sinuses are otherwise well-aerated.Status post bilateral ocular lens implants. The included ocular globes and orbital contents are non-suspicious. OTHER: None. IMPRESSION: No acute intracranial process. Moderate white matter changes can be seen with chronic small vessel ischemic disease or, patient's reported demyelination. Electronically Signed   By: Elon Alas M.D.   On: 08/06/2016 02:43    EKG: Independently reviewed. 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  --Neurology consulted in the ED.  Recommendations pending.  The patient is already stating that she does not want IV steroids until her heart rate is under better control.  She is also refusing closed MRI scan at this time. --Anticoagulated with warfarin, no aspirin --Continue home statin dose  Atrial fibrillation with RVR --IV cardizem infusion, hold oral cardizem --continue home dose of Bystolic --patient requesting cardiology consultation in the AM --Check serial troponin,  BNP, TFTs, chest xray (she is complaining of dyspnea) --Continue home dose of lasix for now.  STOP IV fluids. --She is due for an outpatient echo on Tuesday; will order now since it is pertinent to her acute presentation  Warfarin toxicity --HOLD home dose for now --Daily PT/INR --Pharmacy to manage  GERD --PPI in the AM, H2 blocker qHS  Hypothyroidism --Continue home dose of levothyroxine for now --Checking TFTs  Anxiety --Xanax prn, wellbutrin   DVT prophylaxis: Full anticoagulation with warfarin Code Status: FULL Family Communication: Patient's husband present at bedside in the ED at time of admission. Disposition Plan: Expect she will go home at discharge. Consults called: Neurology.  Will need to call cardiology per patient request in the AM. Admission status: Place in observation but she needs stepdown unit due to cardizem infusion.   TIME SPENT: 75 minutes   Eber Jones MD [facility] Pager [phone]  If 7PM-7AM, please contact night-coverage [email or web address] Password [email or web address]  08/06/2016, 4:40 AM

## 2016-08-06 NOTE — ED Triage Notes (Signed)
Per [facility], pt from home w/ hx of MS states she has been "losing dexterity in her right hand" that began x2 days ago and has gotten worse since 6pm as well as experiencing some balance issues. Pt admits to hx of neuropathy and weakness to her rt arm. Pt A&Ox4, no gross neuro deficits per EMS.

## 2016-08-06 NOTE — Progress Notes (Signed)
CRITICAL VALUE ALERT  Critical value received: Troponin 0.05  Date of notification: 08/06/2016  Time of notification: 2016  Critical value read back:Yes.    Nurse who received alert:  Erlene Quan  MD notified (1st page) Gaetano Net, NP  Time of first page: 2018  MD notified (2nd page):  Time of second page:  Responding MD: K.Shorr,NP  Time MD responded:2030

## 2016-08-06 NOTE — Progress Notes (Signed)
Patient placed on CPAP Nasal mask for hours of sleep and tolerating well at this time. 2l O2 bleed in. Sats 98%, Hr 77

## 2016-08-06 NOTE — Progress Notes (Signed)
[place] TEAM 1 - Stepdown/ICU TEAM  Toni [name]  [id number] DOB: 21-Jul-1946 DOA: 08/05/2016 PCP: [name], MD    Brief Narrative:  70 y.o. woman with PAF (CHADS-VASc 4, anticoagulated with warfarin), multiple sclerosis (followed by Dr. Brett Fairy), central sleep apnea on BiPAP, HTN, GERD, and hypothyroidism who developed the acute onset of right hand weakness with decreased dexterity w/ transient word finding difficulty.  She denied facial droop or other focal deficits.  She has chronic RLE weakness which she attributes to her MS and felt this was unchanged.    In the ED Head CT was negative for acute process.  Neurology was consulted.  She was in atrial fibrillation with RVR, HR as high as 145.  Cardizem gtt was initiated.  Subjective: Pt is seen for a f/u visit.    Assessment & Plan:  Right hand weakness - MS flair v/s TIA/CVA -Neurology consulted - she refused closed MRI  Atrial fibrillation with RVR -IV cardizem - continue Bystolic - due for an outpatient echo on Tuesday so has been ordered   Coagulopathy due to Warfarin  -Pharmacy to manage  GERD -PPI   Hypothyroidism -Continue home dose of levothyroxine   Anxiety -Xanax prn, Wellbutrin  DVT prophylaxis: warfarin Code Status: FULL CODE Family Communication: no family present at time of exam  Disposition Plan:   Consultants:  Neurology   Procedures: 12/17 TTE  Antimicrobials:  none  Objective: Blood pressure (!) 108/53, pulse 97, temperature 100 F (37.8 C), temperature source Oral, resp. rate 15, height 5\' 3"  (1.6 m), weight 126.7 kg (279 lb 6.4 oz), SpO2 95 %.  Intake/Output Summary (Last 24 hours) at 08/06/16 1320 Last data filed at 08/06/16 1000  Gross per 24 hour  Intake              405 ml  Output              250 ml  Net              155 ml   Filed Weights   08/06/16 0009 08/06/16 0611  Weight: 117.9 kg (260 lb) 126.7 kg (279 lb 6.4 oz)    Examination: Pt was seen for  a f/u visit.    CBC:  Recent Labs Lab 08/06/16 0125 08/06/16 0129  WBC 9.4  --   NEUTROABS 6.6  --   HGB 13.4 12.9  HCT 40.9 38.0  MCV 95.6  --   PLT 153  --    Basic Metabolic Panel:  Recent Labs Lab 08/06/16 0125 08/06/16 0129  NA 139 138  K 4.7 4.4  CL 100* 100*  CO2 33*  --   GLUCOSE 110* 110*  BUN 18 22*  CREATININE 0.84 0.70  CALCIUM 8.9  --    GFR: Estimated Creatinine Clearance: 86 mL/min (by C-G formula based on SCr of 0.7 mg/dL).  Liver Function Tests:  Recent Labs Lab 08/06/16 0125  AST 19  ALT 21  ALKPHOS 51  BILITOT 0.7  PROT 6.1*  ALBUMIN 3.1*    Coagulation Profile:  Recent Labs Lab 08/06/16 0125  INR 4.36*    Cardiac Enzymes:  Recent Labs Lab 08/06/16 0647 08/06/16 1143  TROPONINI <0.03 <0.03    HbA1C: Hgb A1c MFr Bld  Date/Time Value Ref Range Status  04/21/2010 09:05 PM  <5.7 % Final   5.5 (NOTE)  According to the ADA Clinical Practice Recommendations for 2011, when HbA1c is used as a screening test:   >=6.5%   Diagnostic of Diabetes Mellitus           (if abnormal result  is confirmed)  5.7-6.4%   Increased risk of developing Diabetes Mellitus  References:Diagnosis and Classification of Diabetes Mellitus,Diabetes [id number] 1):S62-S69 and Standards of Medical Care in         Diabetes - 2011,Diabetes [id number]  (Suppl 1):S11-S61.    CBG: No results for input(s): GLUCAP in the last 168 hours.  Recent Results (from the past 240 hour(s))  MRSA PCR Screening     Status: None   Collection Time: 08/06/16  6:40 AM  Result Value Ref Range Status   MRSA by PCR NEGATIVE NEGATIVE Final    Comment:        The GeneXpert MRSA Assay (FDA approved for NASAL specimens only), is one component of a comprehensive MRSA colonization surveillance program. It is not intended to diagnose MRSA infection nor to guide or monitor treatment for MRSA  infections.      Scheduled Meds: . buPROPion  300 mg Oral Daily  . cetirizine  10 mg Oral Daily  . famotidine  20 mg Oral QHS  . furosemide  60 mg Oral Daily  . gabapentin  300 mg Oral TID  . levothyroxine  75 mcg Oral QAC breakfast  . nebivolol  20 mg Oral Daily  . pantoprazole  40 mg Oral Daily  . simvastatin  10 mg Oral QHS  . tamsulosin  0.4 mg Oral Daily  . Warfarin - Pharmacist Dosing Inpatient   Does not apply q1800   Continuous Infusions: . diltiazem (CARDIZEM) infusion 7.5 mg/hr (08/06/16 0625)     LOS: 0 days   Time spent: No Charge  Cherene Altes, MD [facility] Office  [phone] Pager - Text Page per Amion as per below:  On-Call/Text Page:      [email or web address]      password [email or web address]  If 7PM-7AM, please contact night-coverage [email or web address] Password [email or web address] 08/06/2016, 1:20 PM

## 2016-08-07 ENCOUNTER — Observation Stay (HOSPITAL_COMMUNITY): Payer: Medicare Other

## 2016-08-07 DIAGNOSIS — G35 Multiple sclerosis: Secondary | ICD-10-CM | POA: Diagnosis not present

## 2016-08-07 DIAGNOSIS — R791 Abnormal coagulation profile: Secondary | ICD-10-CM | POA: Diagnosis present

## 2016-08-07 DIAGNOSIS — E569 Vitamin deficiency, unspecified: Secondary | ICD-10-CM | POA: Diagnosis not present

## 2016-08-07 DIAGNOSIS — N319 Neuromuscular dysfunction of bladder, unspecified: Secondary | ICD-10-CM | POA: Diagnosis not present

## 2016-08-07 DIAGNOSIS — R062 Wheezing: Secondary | ICD-10-CM | POA: Diagnosis not present

## 2016-08-07 DIAGNOSIS — G4731 Primary central sleep apnea: Secondary | ICD-10-CM | POA: Diagnosis present

## 2016-08-07 DIAGNOSIS — R040 Epistaxis: Secondary | ICD-10-CM | POA: Diagnosis not present

## 2016-08-07 DIAGNOSIS — E119 Type 2 diabetes mellitus without complications: Secondary | ICD-10-CM | POA: Diagnosis not present

## 2016-08-07 DIAGNOSIS — R5081 Fever presenting with conditions classified elsewhere: Secondary | ICD-10-CM | POA: Diagnosis not present

## 2016-08-07 DIAGNOSIS — Z86718 Personal history of other venous thrombosis and embolism: Secondary | ICD-10-CM | POA: Diagnosis not present

## 2016-08-07 DIAGNOSIS — E039 Hypothyroidism, unspecified: Secondary | ICD-10-CM | POA: Diagnosis present

## 2016-08-07 DIAGNOSIS — R531 Weakness: Secondary | ICD-10-CM | POA: Diagnosis not present

## 2016-08-07 DIAGNOSIS — I509 Heart failure, unspecified: Secondary | ICD-10-CM | POA: Diagnosis present

## 2016-08-07 DIAGNOSIS — Z7901 Long term (current) use of anticoagulants: Secondary | ICD-10-CM | POA: Diagnosis not present

## 2016-08-07 DIAGNOSIS — F419 Anxiety disorder, unspecified: Secondary | ICD-10-CM | POA: Diagnosis present

## 2016-08-07 DIAGNOSIS — Z9841 Cataract extraction status, right eye: Secondary | ICD-10-CM | POA: Diagnosis not present

## 2016-08-07 DIAGNOSIS — Z6841 Body Mass Index (BMI) 40.0 and over, adult: Secondary | ICD-10-CM | POA: Diagnosis not present

## 2016-08-07 DIAGNOSIS — E038 Other specified hypothyroidism: Secondary | ICD-10-CM | POA: Diagnosis not present

## 2016-08-07 DIAGNOSIS — I10 Essential (primary) hypertension: Secondary | ICD-10-CM | POA: Diagnosis not present

## 2016-08-07 DIAGNOSIS — K219 Gastro-esophageal reflux disease without esophagitis: Secondary | ICD-10-CM | POA: Diagnosis present

## 2016-08-07 DIAGNOSIS — T7840XD Allergy, unspecified, subsequent encounter: Secondary | ICD-10-CM | POA: Diagnosis not present

## 2016-08-07 DIAGNOSIS — I4891 Unspecified atrial fibrillation: Secondary | ICD-10-CM | POA: Diagnosis not present

## 2016-08-07 DIAGNOSIS — K5900 Constipation, unspecified: Secondary | ICD-10-CM | POA: Diagnosis not present

## 2016-08-07 DIAGNOSIS — R29898 Other symptoms and signs involving the musculoskeletal system: Secondary | ICD-10-CM | POA: Diagnosis not present

## 2016-08-07 DIAGNOSIS — Z87891 Personal history of nicotine dependence: Secondary | ICD-10-CM | POA: Diagnosis not present

## 2016-08-07 DIAGNOSIS — F329 Major depressive disorder, single episode, unspecified: Secondary | ICD-10-CM | POA: Diagnosis present

## 2016-08-07 DIAGNOSIS — I428 Other cardiomyopathies: Secondary | ICD-10-CM | POA: Diagnosis present

## 2016-08-07 DIAGNOSIS — T45515A Adverse effect of anticoagulants, initial encounter: Secondary | ICD-10-CM | POA: Diagnosis present

## 2016-08-07 DIAGNOSIS — J189 Pneumonia, unspecified organism: Secondary | ICD-10-CM | POA: Diagnosis not present

## 2016-08-07 DIAGNOSIS — E785 Hyperlipidemia, unspecified: Secondary | ICD-10-CM | POA: Diagnosis present

## 2016-08-07 DIAGNOSIS — Z981 Arthrodesis status: Secondary | ICD-10-CM | POA: Diagnosis not present

## 2016-08-07 DIAGNOSIS — R0600 Dyspnea, unspecified: Secondary | ICD-10-CM

## 2016-08-07 DIAGNOSIS — Z9842 Cataract extraction status, left eye: Secondary | ICD-10-CM | POA: Diagnosis not present

## 2016-08-07 DIAGNOSIS — E669 Obesity, unspecified: Secondary | ICD-10-CM | POA: Diagnosis not present

## 2016-08-07 DIAGNOSIS — M6281 Muscle weakness (generalized): Secondary | ICD-10-CM | POA: Diagnosis not present

## 2016-08-07 DIAGNOSIS — R509 Fever, unspecified: Secondary | ICD-10-CM

## 2016-08-07 DIAGNOSIS — I872 Venous insufficiency (chronic) (peripheral): Secondary | ICD-10-CM | POA: Diagnosis not present

## 2016-08-07 DIAGNOSIS — J3089 Other allergic rhinitis: Secondary | ICD-10-CM | POA: Diagnosis not present

## 2016-08-07 DIAGNOSIS — R252 Cramp and spasm: Secondary | ICD-10-CM | POA: Diagnosis not present

## 2016-08-07 DIAGNOSIS — I110 Hypertensive heart disease with heart failure: Secondary | ICD-10-CM | POA: Diagnosis present

## 2016-08-07 DIAGNOSIS — Z79899 Other long term (current) drug therapy: Secondary | ICD-10-CM | POA: Diagnosis not present

## 2016-08-07 DIAGNOSIS — G8929 Other chronic pain: Secondary | ICD-10-CM | POA: Diagnosis present

## 2016-08-07 DIAGNOSIS — I480 Paroxysmal atrial fibrillation: Secondary | ICD-10-CM | POA: Diagnosis not present

## 2016-08-07 LAB — PROTIME-INR
INR: 2.3
PROTHROMBIN TIME: 25.7 s — AB (ref 11.4–15.2)

## 2016-08-07 LAB — COMPREHENSIVE METABOLIC PANEL
ALBUMIN: 2.7 g/dL — AB (ref 3.5–5.0)
ALK PHOS: 43 U/L (ref 38–126)
ALT: 16 U/L (ref 14–54)
ANION GAP: 10 (ref 5–15)
AST: 23 U/L (ref 15–41)
BUN: 16 mg/dL (ref 6–20)
CO2: 32 mmol/L (ref 22–32)
Calcium: 8.3 mg/dL — ABNORMAL LOW (ref 8.9–10.3)
Chloride: 93 mmol/L — ABNORMAL LOW (ref 101–111)
Creatinine, Ser: 0.82 mg/dL (ref 0.44–1.00)
GFR calc Af Amer: >60 mL/min (ref >60)
GFR calc non Af Amer: >60 mL/min (ref >60)
GLUCOSE: 75 mg/dL (ref 65–99)
POTASSIUM: 3.7 mmol/L (ref 3.5–5.1)
SODIUM: 135 mmol/L (ref 135–145)
Total Bilirubin: 1.4 mg/dL — ABNORMAL HIGH (ref 0.3–1.2)
Total Protein: 5.3 g/dL — ABNORMAL LOW (ref 6.5–8.1)

## 2016-08-07 LAB — CBC
HEMATOCRIT: 38.1 % (ref 36.0–46.0)
HEMOGLOBIN: 12.3 g/dL (ref 12.0–15.0)
MCH: 30.5 pg (ref 26.0–34.0)
MCHC: 32.3 g/dL (ref 30.0–36.0)
MCV: 94.5 fL (ref 78.0–100.0)
Platelets: 169 10*3/uL (ref 150–400)
RBC: 4.03 MIL/uL (ref 3.87–5.11)
RDW: 12.8 % (ref 11.5–15.5)
WBC: 7.3 10*3/uL (ref 4.0–10.5)

## 2016-08-07 MED ORDER — LORAZEPAM 2 MG/ML IJ SOLN
1.0000 mg | Freq: Once | INTRAMUSCULAR | Status: AC
  Administered 2016-08-08: 1 mg via INTRAVENOUS
  Filled 2016-08-07: qty 1

## 2016-08-07 MED ORDER — DEXTROSE 5 % IV SOLN
1.0000 g | INTRAVENOUS | Status: DC
  Administered 2016-08-07 – 2016-08-11 (×5): 1 g via INTRAVENOUS
  Filled 2016-08-07 (×6): qty 10

## 2016-08-07 MED ORDER — LACTULOSE 10 GM/15ML PO SOLN
10.0000 g | Freq: Two times a day (BID) | ORAL | Status: DC
  Administered 2016-08-07 – 2016-08-12 (×11): 10 g via ORAL
  Filled 2016-08-07 (×11): qty 15

## 2016-08-07 MED ORDER — WARFARIN SODIUM 7.5 MG PO TABS
7.5000 mg | ORAL_TABLET | Freq: Once | ORAL | Status: AC
  Administered 2016-08-07: 7.5 mg via ORAL
  Filled 2016-08-07: qty 1

## 2016-08-07 MED ORDER — AZITHROMYCIN 500 MG PO TABS
500.0000 mg | ORAL_TABLET | Freq: Every day | ORAL | Status: DC
  Administered 2016-08-07 – 2016-08-12 (×6): 500 mg via ORAL
  Filled 2016-08-07 (×6): qty 1

## 2016-08-07 NOTE — Progress Notes (Signed)
Subjective: Now complains of double vision an dnumbness in LEFT fingertips as well.   Exam: Vitals:   08/07/16 0400 08/07/16 0727  BP:  (!) 103/55  Pulse: 85 83  Resp: 12 11  Temp:  98.6 F (37 C)   Gen: In bed, NAD Resp: non-labored breathing, no acute distress Abd: soft, nt  Neuro: MS: awake, alert, interactive and appropriate [id number], VFF, face symmetric, face sensation symmetric. No clear dysconjugate gaze, but diploplia, worse at midline compared to lateral gaze bilaterally Motor: 4/5 strength throughout the right side.  Sensory:decreased in right arm and leg.   Pertinent Labs: Low albumin  Impression: 70 yo F with right arm and leg weakness. In the setting of history of right sided weakness, this could all be unmasking of decicits due to febrile illness, but also could be new flare due to illness. I discussed options including no treatment, MRI and treatment if there is evidence of inflammation, or empiric treatment without imaging. Given febrile illness and chronic immunosuppression, I would greatly not favor the latter option.   Recommendations: 1)MRI brain and C-spine w/wo contrast 2) fever workup per IM 3) will follow.   Roland Rack, MD [facility] [phone]  If 7pm- 7am, please page neurology on call as listed in [place].

## 2016-08-07 NOTE — Progress Notes (Signed)
Patient placed on CPAP per her request.

## 2016-08-07 NOTE — Progress Notes (Signed)
Physical Therapy Treatment Patient Details Name: RAYLEN PRUETER MRN: [id number] DOB: 01-09-1946 Today's Date: 08-30-16    History of Present Illness 70 y.o.woman with PAF (CHADS-VASc 4, anticoagulated with warfarin), multiple sclerosis (followed by Dr. Brett Fairy), central sleep apnea on BiPAP, HTN, GERD, and hypothyroidism who developed the acute onset of right hand weakness with decreased dexterity w/ transient word finding difficulty.  She denied facial droop or other focal deficits. She has chronic RLE weakness which she attributes to her MS and felt this was unchanged. She was in atrial fibrillation with RVR, HR as high as 145.     PT Comments    Pt reports feeling bad due to fever and willing to do bed ex's but doesn't feel up to getting up right now.  Follow Up Recommendations  Home health PT;Supervision/Assistance - 24 hour     Equipment Recommendations  None recommended by PT    Recommendations for Other Services       Precautions / Restrictions Precautions Precautions: Fall    Mobility  Bed Mobility                  Transfers                    Ambulation/Gait                 Stairs            Wheelchair Mobility    Modified Rankin (Stroke Patients Only)       Balance                                    Cognition Arousal/Alertness: Awake/alert Behavior During Therapy: WFL for tasks assessed/performed Overall Cognitive Status: Within Functional Limits for tasks assessed                      Exercises General Exercises - Upper Extremity Shoulder Flexion: AROM;Both;10 reps;Supine Elbow Extension: Strengthening;15 reps;Supine General Exercises - Lower Extremity Short Arc Quad: AROM;Both;15 reps;Supine Heel Slides: Strengthening;AAROM;Both;15 reps (resisted on rt and assisted on lt) Hip ABduction/ADduction: AROM;AAROM;Both;15 reps;Supine (Assisted on lt and active on rt)    General Comments         Pertinent Vitals/Pain Pain Assessment: No/denies pain    Home Living                      Prior Function            PT Goals (current goals can now be found in the care plan section) Progress towards PT goals: Not progressing toward goals - comment    Frequency    Min 3X/week      PT Plan Current plan remains appropriate    Co-evaluation             End of Session Equipment Utilized During Treatment: Oxygen Activity Tolerance: Patient tolerated treatment well Patient left: with call bell/phone within reach;in bed     Time: 1013-1028 PT Time Calculation (min) (ACUTE ONLY): 15 min  Charges:  $Therapeutic Exercise: 8-22 mins                    G CodesShary Decamp [facility] 30-Aug-2016, 10:37 AM [place]

## 2016-08-07 NOTE — Evaluation (Signed)
Occupational Therapy Evaluation Patient Details Name: Toni [name] MRN: [id number] DOB: Aug 21, 1946 Today's Date: 08/07/2016    History of Present Illness 70 y.o.woman with PAF (CHADS-VASc 4, anticoagulated with warfarin), multiple sclerosis (followed by Dr. Brett Fairy), central sleep apnea on BiPAP, HTN, GERD, and hypothyroidism who developed the acute onset of right hand weakness with decreased dexterity w/ transient word finding difficulty.  She denied facial droop or other focal deficits. She has chronic RLE weakness which she attributes to her MS and felt this was unchanged. She was in atrial fibrillation with RVR, HR as high as 145.    Clinical Impression   Pt admitted with above. She demonstrates the below listed deficits and will benefit from continued OT to maximize safety and independence with BADLs.  Pt presents to OT with generalized weakness, decreased activity tolerance, impaired Rt UE function/coordination.  She requires  Mod A, overall for ADL.  Prior to admission, she was performing ADLs mod I at w/c level with exception of LB dressing.  She reports spouse will be available to assist at discharge.  Recommend HHOT.       Follow Up Recommendations  Home health OT;Supervision/Assistance - 24 hour    Equipment Recommendations  None recommended by OT    Recommendations for Other Services       Precautions / Restrictions Precautions Precautions: Fall      Mobility Bed Mobility                  Transfers                      Balance                                            ADL Overall ADL's : Needs assistance/impaired Eating/Feeding: Set up;Sitting   Grooming: Wash/dry hands;Wash/dry face;Oral care;Brushing hair;Set up;Bed level   Upper Body Bathing: Moderate assistance;Bed level   Lower Body Bathing: Maximal assistance;Bed level   Upper Body Dressing : Moderate assistance;Bed level   Lower Body Dressing: Maximal  assistance;Bed level   Toilet Transfer: Total assistance Toilet Transfer Details (indicate cue type and reason): Pt deferred this date due to fatigue  Toileting- Clothing Manipulation and Hygiene: Maximal assistance;Bed level         General ADL Comments: Pt provided with foam built up handles to allow her to self feed with Rt UE.  Pt only agreeable to bed level exercise today due to fatigue      Vision     Perception     Praxis      Pertinent Vitals/Pain Pain Assessment: No/denies pain     Hand Dominance Right   Extremity/Trunk Assessment Upper Extremity Assessment Upper Extremity Assessment: RUE deficits/detail RUE Deficits / Details: Pt reports long standing Rt UE weakness.  She reports new onset new incordination Rt hand.  She is able to oppose digit 4.  Demonstrates difficulty manipulating small objects  RUE Coordination: decreased fine motor   Lower Extremity Assessment Lower Extremity Assessment: Defer to PT evaluation       Communication Communication Communication: No difficulties   Cognition Arousal/Alertness: Awake/alert Behavior During Therapy: WFL for tasks assessed/performed Overall Cognitive Status: Within Functional Limits for tasks assessed                     General Comments  Exercises Exercises: General Upper Extremity     Shoulder Instructions      Home Living Family/patient expects to be discharged to:: Private residence Living Arrangements: Spouse/significant other Available Help at Discharge: Family;Available 24 hours/day Type of Home: House Home Access: Ramped entrance     Home Layout: Two level;Able to live on main level with bedroom/bathroom     Bathroom Shower/Tub: Walk-in shower;Door   Bathroom Toilet: Handicapped height     Home Equipment: Environmental consultant - 2 wheels;Wheelchair - manual;Grab bars - tub/shower;Shower seat - built in;Hand held shower head (lift chair)          Prior Functioning/Environment Level  of Independence: Needs assistance  Gait / Transfers Assistance Needed: walks household distances with PT,  RW and wheelchair push behind her ADL's / Homemaking Assistance Needed: husband assists with LB dressing, shower transfers, IADLs.    Comments: Pt reports she mostly uses her w/c around her house and performs transfers mod I         OT Problem List: Decreased strength;Decreased activity tolerance;Impaired balance (sitting and/or standing);Decreased coordination;Decreased safety awareness;Decreased knowledge of use of DME or AE;Cardiopulmonary status limiting activity;Obesity;Impaired UE functional use   OT Treatment/Interventions: Self-care/ADL training;Therapeutic exercise;DME and/or AE instruction;Therapeutic activities;Energy conservation;Patient/family education;Balance training    OT Goals(Current goals can be found in the care plan section) Acute Rehab OT Goals Patient Stated Goal: to get my hand back  OT Goal Formulation: With patient Time For Goal Achievement: 08/21/16 Potential to Achieve Goals: Good ADL Goals Pt Will Perform Grooming: with set-up;sitting Pt Will Perform Upper Body Bathing: with set-up;sitting Pt Will Perform Lower Body Bathing: with min guard assist;sit to/from stand;with adaptive equipment Pt Will Perform Upper Body Dressing: sitting;with set-up Pt Will Transfer to Toilet: with min guard assist;stand pivot transfer;bedside commode Pt Will Perform Toileting - Clothing Manipulation and hygiene: with min guard assist;sit to/from stand Pt/caregiver will Perform Home Exercise Program: Increased strength;Left upper extremity;Right Upper extremity;With theraband;Independently  OT Frequency: Min 2X/week   Barriers to D/C:            Co-evaluation              End of Session Equipment Utilized During Treatment: Oxygen Nurse Communication: Mobility status  Activity Tolerance: Patient limited by fatigue Patient left: in bed;with call bell/phone  within reach   Time: 1434-1501 OT Time Calculation (min): 27 min Charges:    G-Codes:    [name] 2016/08/20, 6:59 PM

## 2016-08-07 NOTE — Care Management Note (Signed)
Case Management Note  Patient Details  Name: Toni [name] MRN: [id number] Date of Birth: Dec 20, 1945  Subjective/Objective:    Adm w at fib                Action/Plan: lives at home, pcp dr Dagmar Hait. Act w adv homecare for hhpt.   Expected Discharge Date:  08/11/16               Expected Discharge Plan:  [place]  In-House Referral:     Discharge planning Services  CM Consult  Post Acute Care Choice:  Resumption of Svcs/PTA Provider, Home Health Choice offered to:  Patient  DME Arranged:    DME Agency:     HH Arranged:  PT, OT, Nurse's Aide [place] Agency:  [facility]  Status of Service:  In process, will continue to follow  If discussed at Long Length of Stay Meetings, dates discussed:    Additional Comments: will add hhot and bath aid to hhpt ref. Alerted jermaine w [facility] that pt in hospital. Will cont to follow.  Lacretia Leigh, RN 08/07/2016, 2:55 PM

## 2016-08-07 NOTE — Progress Notes (Addendum)
[place] TEAM 1 - Stepdown/ICU TEAM  Toni [name]  [id number] DOB: 1946-08-01 DOA: 08/05/2016 PCP: [name], MD    Brief Narrative:  70 y.o. woman with PAF (CHADS-VASc 4, anticoagulated with warfarin), multiple sclerosis (followed by Dr. Brett Fairy), central sleep apnea on BiPAP, HTN, GERD, and hypothyroidism who developed the acute onset of right hand weakness with decreased dexterity w/ transient word finding difficulty.  She denied facial droop or other focal deficits.  She has chronic RLE weakness which she attributes to her MS and felt this was unchanged.    In the ED Head CT was negative for acute process.  Neurology was consulted.  She was in atrial fibrillation with RVR, HR as high as 145.  Cardizem gtt was initiated.  Subjective: Now complains of double vision an dnumbness in LEFT fingertips as well  Assessment & Plan:  Right hand weakness - MS flair v/s TIA/CVA, cervical radiculopathy -Neurology consulted -MRI brain and C-spine w/wo contrast Doubt CVA, INR therapeutic  Atrial fibrillation with RVR, blood pressure soft -IV cardizem - currently on beta blocker and Cardizem PO and controlled , discontinue IV Cardizem  Fever Chest x-ray shows CAP,wll start rx   UA negative on admission Will obtain blood culture 2  Coagulopathy due to Warfarin  INR now corrected  GERD -PPI   Hypothyroidism -Continue home dose of levothyroxine  Recent TSH 1.08, check free T4  Anxiety -Xanax prn, Wellbutrin   DVT prophylaxis: warfarin Code Status: FULL CODE Family Communication: no family present at time of exam  Disposition Plan: 1-2 days  Consultants:  Neurology   Procedures: 12/17 TTE  Antimicrobials:  none  Objective: Blood pressure (!) 103/55, pulse 83, temperature 99.5 F (37.5 C), temperature source Oral, resp. rate 11, height 5\' 3"  (1.6 m), weight 126.7 kg (279 lb 6.4 oz), SpO2 99 %.  Intake/Output Summary (Last 24 hours) at 08/07/16  1016 Last data filed at 08/07/16 0900  Gross per 24 hour  Intake              230 ml  Output                0 ml  Net              230 ml   Filed Weights   08/06/16 0009 08/06/16 0611  Weight: 117.9 kg (260 lb) 126.7 kg (279 lb 6.4 oz)    Examination: Pt was seen for a f/u visit.    CBC:  Recent Labs Lab 08/06/16 0125 08/06/16 0129 08/07/16 0142  WBC 9.4  --  7.3  NEUTROABS 6.6  --   --   HGB 13.4 12.9 12.3  HCT 40.9 38.0 38.1  MCV 95.6  --  94.5  PLT 153  --  [id number]   Basic Metabolic Panel:  Recent Labs Lab 08/06/16 0125 08/06/16 0129 08/07/16 0142  NA 139 138 135  K 4.7 4.4 3.7  CL 100* 100* 93*  CO2 33*  --  32  GLUCOSE 110* 110* 75  BUN 18 22* 16  CREATININE 0.84 0.70 0.82  CALCIUM 8.9  --  8.3*   GFR: Estimated Creatinine Clearance: 83.9 mL/min (by C-G formula based on SCr of 0.82 mg/dL).  Liver Function Tests:  Recent Labs Lab 08/06/16 0125 08/07/16 0142  AST 19 23  ALT 21 16  ALKPHOS 51 43  BILITOT 0.7 1.4*  PROT 6.1* 5.3*  ALBUMIN 3.1* 2.7*    Coagulation Profile:  Recent Labs Lab 08/06/16 0125  08/07/16 0142  INR 4.36* 2.30    Cardiac Enzymes:  Recent Labs Lab 08/06/16 0647 08/06/16 1143 08/06/16 1833  TROPONINI <0.03 <0.03 0.05*    HbA1C: Hgb A1c MFr Bld  Date/Time Value Ref Range Status  04/21/2010 09:05 PM  <5.7 % Final   5.5 (NOTE)                                                                       According to the ADA Clinical Practice Recommendations for 2011, when HbA1c is used as a screening test:   >=6.5%   Diagnostic of Diabetes Mellitus           (if abnormal result  is confirmed)  5.7-6.4%   Increased risk of developing Diabetes Mellitus  References:Diagnosis and Classification of Diabetes Mellitus,Diabetes [id number] 1):S62-S69 and Standards of Medical Care in         Diabetes - 2011,Diabetes [id number]  (Suppl 1):S11-S61.    CBG: No results for input(s): GLUCAP in the last 168 hours.  Recent  Results (from the past 240 hour(s))  MRSA PCR Screening     Status: None   Collection Time: 08/06/16  6:40 AM  Result Value Ref Range Status   MRSA by PCR NEGATIVE NEGATIVE Final    Comment:        The GeneXpert MRSA Assay (FDA approved for NASAL specimens only), is one component of a comprehensive MRSA colonization surveillance program. It is not intended to diagnose MRSA infection nor to guide or monitor treatment for MRSA infections.      Scheduled Meds: . buPROPion  300 mg Oral Daily  . cetirizine  10 mg Oral Daily  . diltiazem  120 mg Oral Daily  . famotidine  20 mg Oral QHS  . furosemide  60 mg Oral Daily  . gabapentin  300 mg Oral TID  . levothyroxine  75 mcg Oral QAC breakfast  . nebivolol  20 mg Oral QHS  . pantoprazole  40 mg Oral Daily  . simvastatin  10 mg Oral QHS  . tamsulosin  0.4 mg Oral Daily  . warfarin  7.5 mg Oral ONCE-1800  . Warfarin - Pharmacist Dosing Inpatient   Does not apply q1800   Continuous Infusions: . diltiazem (CARDIZEM) infusion 10 mg/hr (08/07/16 0400)     LOS: 0 days   Time spent: No Charge   [facility] Office  [phone] Pager - Text Page per Shea Evans as per below:  On-Call/Text Page:      [email or web address]      password [email or web address]  If 7PM-7AM, please contact night-coverage [email or web address] Password [email or web address] 08/07/2016, 10:16 AM

## 2016-08-07 NOTE — Progress Notes (Signed)
Occupational Therapy Evaluation Addendum:    08-16-2016 1500  OT G-codes **NOT FOR INPATIENT CLASS**  Functional Limitation Self care  Self Care Current Status [id number]) CK  Self Care Goal Status [id number]) CJ  OT General Charges  $OT Visit 1 Procedure  OT Evaluation  $OT Eval Moderate Complexity 1 Procedure  OT Treatments  $Therapeutic Exercise 8-22 mins  [facility], OTR/L [phone]

## 2016-08-07 NOTE — Progress Notes (Signed)
ANTICOAGULATION CONSULT NOTE - Follow Up Consult  Pharmacy Consult for Coumadin Indication: atrial fibrillation  Allergies  Allergen Reactions  . Hydrocodone Nausea And Vomiting    Hydrocodone causes vomiting  . Oxycodone Nausea And Vomiting    Oral oxycodone causes vomiting  . Chlorhexidine Gluconate Rash    Sores at site  . Zoloft [Sertraline] Hives, Swelling and Rash    Patient Measurements: Height: 5\' 3"  (160 cm) Weight: 279 lb 6.4 oz (126.7 kg) IBW/kg (Calculated) : 52.4  Vital Signs: Temp: 98.6 F (37 C) (12/18 0727) Temp Source: Oral (12/18 0727) BP: 103/55 (12/18 0727) Pulse Rate: 83 (12/18 0727)  Labs:  Recent Labs  08/06/16 0125 08/06/16 0129 08/06/16 0647 08/06/16 1143 08/06/16 1833 08/07/16 0142  HGB 13.4 12.9  --   --   --  12.3  HCT 40.9 38.0  --   --   --  38.1  PLT 153  --   --   --   --  169  APTT 51*  --   --   --   --   --   LABPROT 42.9*  --   --   --   --  25.7*  INR 4.36*  --   --   --   --  2.30  CREATININE 0.84 0.70  --   --   --  0.82  TROPONINI  --   --  <0.03 <0.03 0.05*  --     Estimated Creatinine Clearance: 83.9 mL/min (by C-G formula based on SCr of 0.82 mg/dL).  Assessment: 70yof on coumadin for afib. INR 4.36 on admit and doses held 12/16-17. INR back within therapeutic range today at 2.3. Will give a higher dose today as INR may continue to trend down. CBC stable. No bleeding.  Home dose: 5mg  daily except 2.5mg  Sun/Wed - last taken 12/15  Goal of Therapy:  INR 2-3 Monitor platelets by anticoagulation protocol: Yes   Plan:  1) Coumadin 7.5mg  x 1 2) Daily INR  Deboraha Sprang 08/07/2016,8:46 AM

## 2016-08-08 ENCOUNTER — Other Ambulatory Visit (HOSPITAL_COMMUNITY): Payer: Medicare Other

## 2016-08-08 ENCOUNTER — Inpatient Hospital Stay (HOSPITAL_COMMUNITY): Payer: Medicare Other

## 2016-08-08 LAB — BLOOD CULTURE ID PANEL (REFLEXED)
ACINETOBACTER BAUMANNII: NOT DETECTED
CANDIDA ALBICANS: NOT DETECTED
CANDIDA GLABRATA: NOT DETECTED
CANDIDA PARAPSILOSIS: NOT DETECTED
CANDIDA TROPICALIS: NOT DETECTED
Candida krusei: NOT DETECTED
ENTEROBACTER CLOACAE COMPLEX: NOT DETECTED
ENTEROBACTERIACEAE SPECIES: NOT DETECTED
Enterococcus species: NOT DETECTED
Escherichia coli: NOT DETECTED
Haemophilus influenzae: NOT DETECTED
KLEBSIELLA OXYTOCA: NOT DETECTED
KLEBSIELLA PNEUMONIAE: NOT DETECTED
Listeria monocytogenes: NOT DETECTED
METHICILLIN RESISTANCE: NOT DETECTED
Neisseria meningitidis: NOT DETECTED
PSEUDOMONAS AERUGINOSA: NOT DETECTED
Proteus species: NOT DETECTED
STREPTOCOCCUS PNEUMONIAE: NOT DETECTED
STREPTOCOCCUS PYOGENES: NOT DETECTED
STREPTOCOCCUS SPECIES: NOT DETECTED
Serratia marcescens: NOT DETECTED
Staphylococcus aureus (BCID): NOT DETECTED
Staphylococcus species: DETECTED — AB
Streptococcus agalactiae: NOT DETECTED

## 2016-08-08 LAB — CBC
HCT: 38 % (ref 36.0–46.0)
Hemoglobin: 12.4 g/dL (ref 12.0–15.0)
MCH: 31 pg (ref 26.0–34.0)
MCHC: 32.6 g/dL (ref 30.0–36.0)
MCV: 95 fL (ref 78.0–100.0)
PLATELETS: 182 10*3/uL (ref 150–400)
RBC: 4 MIL/uL (ref 3.87–5.11)
RDW: 12.6 % (ref 11.5–15.5)
WBC: 6.5 10*3/uL (ref 4.0–10.5)

## 2016-08-08 LAB — COMPREHENSIVE METABOLIC PANEL
ALBUMIN: 2.6 g/dL — AB (ref 3.5–5.0)
ALT: 21 U/L (ref 14–54)
ANION GAP: 10 (ref 5–15)
AST: 23 U/L (ref 15–41)
Alkaline Phosphatase: 47 U/L (ref 38–126)
BUN: 18 mg/dL (ref 6–20)
CHLORIDE: 93 mmol/L — AB (ref 101–111)
CO2: 38 mmol/L — AB (ref 22–32)
Calcium: 8.4 mg/dL — ABNORMAL LOW (ref 8.9–10.3)
Creatinine, Ser: 0.81 mg/dL (ref 0.44–1.00)
GFR calc non Af Amer: >60 mL/min (ref >60)
Glucose, Bld: 74 mg/dL (ref 65–99)
POTASSIUM: 3.7 mmol/L (ref 3.5–5.1)
SODIUM: 141 mmol/L (ref 135–145)
Total Bilirubin: 0.7 mg/dL (ref 0.3–1.2)
Total Protein: 5.6 g/dL — ABNORMAL LOW (ref 6.5–8.1)

## 2016-08-08 LAB — PROTIME-INR
INR: 1.74
PROTHROMBIN TIME: 20.6 s — AB (ref 11.4–15.2)

## 2016-08-08 MED ORDER — WARFARIN SODIUM 7.5 MG PO TABS
7.5000 mg | ORAL_TABLET | Freq: Once | ORAL | Status: AC
  Administered 2016-08-08: 7.5 mg via ORAL
  Filled 2016-08-08: qty 1

## 2016-08-08 MED ORDER — GADOBENATE DIMEGLUMINE 529 MG/ML IV SOLN
20.0000 mL | Freq: Once | INTRAVENOUS | Status: AC | PRN
  Administered 2016-08-08: 20 mL via INTRAVENOUS

## 2016-08-08 MED ORDER — SODIUM CHLORIDE 0.9 % IV BOLUS (SEPSIS): 500.0000 mL | Freq: Once | INTRAVENOUS | Status: DC

## 2016-08-08 NOTE — Progress Notes (Signed)
ANTICOAGULATION CONSULT NOTE - Follow Up Consult  Pharmacy Consult for Coumadin Indication: atrial fibrillation  Allergies  Allergen Reactions  . Hydrocodone Nausea And Vomiting    Hydrocodone causes vomiting  . Oxycodone Nausea And Vomiting    Oral oxycodone causes vomiting  . Chlorhexidine Gluconate Rash    Sores at site  . Zoloft [Sertraline] Hives, Swelling and Rash    Patient Measurements: Height: 5\' 3"  (160 cm) Weight: 279 lb 6.4 oz (126.7 kg) IBW/kg (Calculated) : 52.4  Vital Signs: Temp: 98.1 F (36.7 C) (12/19 0716) Temp Source: Oral (12/19 0716) BP: 102/54 (12/19 0716) Pulse Rate: 77 (12/19 0716)  Labs:  Recent Labs  08/06/16 0125 08/06/16 0129 08/06/16 0647 08/06/16 1143 08/06/16 1833 08/07/16 0142 08/08/16 0227  HGB 13.4 12.9  --   --   --  12.3 12.4  HCT 40.9 38.0  --   --   --  38.1 38.0  PLT 153  --   --   --   --  169 182  APTT 51*  --   --   --   --   --   --   LABPROT 42.9*  --   --   --   --  25.7* 20.6*  INR 4.36*  --   --   --   --  2.30 1.74  CREATININE 0.84 0.70  --   --   --  0.82 0.81  TROPONINI  --   --  <0.03 <0.03 0.05*  --   --     Estimated Creatinine Clearance: 85 mL/min (by C-G formula based on SCr of 0.81 mg/dL).  Assessment: 70yof on coumadin for afib. INR 4.36 on admit and doses held 12/16-17. INR back within therapeutic range yesterday and coumadin resumed. INR continuing to trend down today to 1.74. CBC remains stable. No bleeding.   Home dose: 5mg  daily except 2.5mg  Sun/Wed - last taken 12/15  Goal of Therapy:  INR 2-3 Monitor platelets by anticoagulation protocol: Yes   Plan:  1) Repeat coumadin 7.5mg  x 1 2) Daily INR 3) ? Need to add bridge  Deboraha Sprang 08/08/2016,8:41 AM

## 2016-08-08 NOTE — Progress Notes (Signed)
  PHARMACY - PHYSICIAN COMMUNICATION CRITICAL VALUE ALERT - BLOOD CULTURE IDENTIFICATION (BCID)  Results for orders placed or performed during the hospital encounter of 08/05/16  Blood Culture ID Panel (Reflexed) (Collected: 08/07/2016 12:17 PM)  Result Value Ref Range   Enterococcus species NOT DETECTED NOT DETECTED   Listeria monocytogenes NOT DETECTED NOT DETECTED   Staphylococcus species DETECTED (A) NOT DETECTED   Staphylococcus aureus NOT DETECTED NOT DETECTED   Methicillin resistance NOT DETECTED NOT DETECTED   Streptococcus species NOT DETECTED NOT DETECTED   Streptococcus agalactiae NOT DETECTED NOT DETECTED   Streptococcus pneumoniae NOT DETECTED NOT DETECTED   Streptococcus pyogenes NOT DETECTED NOT DETECTED   Acinetobacter baumannii NOT DETECTED NOT DETECTED   Enterobacteriaceae species NOT DETECTED NOT DETECTED   Enterobacter cloacae complex NOT DETECTED NOT DETECTED   Escherichia coli NOT DETECTED NOT DETECTED   Klebsiella oxytoca NOT DETECTED NOT DETECTED   Klebsiella pneumoniae NOT DETECTED NOT DETECTED   Proteus species NOT DETECTED NOT DETECTED   Serratia marcescens NOT DETECTED NOT DETECTED   Haemophilus influenzae NOT DETECTED NOT DETECTED   Neisseria meningitidis NOT DETECTED NOT DETECTED   Pseudomonas aeruginosa NOT DETECTED NOT DETECTED   Candida albicans NOT DETECTED NOT DETECTED   Candida glabrata NOT DETECTED NOT DETECTED   Candida krusei NOT DETECTED NOT DETECTED   Candida parapsilosis NOT DETECTED NOT DETECTED   Candida tropicalis NOT DETECTED NOT DETECTED    Name of physician (or Provider) Contacted: N. Abrol  Currently on ceftriaxone and azithromycin for fever. CXR does show possible PNA. Blood cx is 1/2 with GPC in clusters. BCID reports as staph species, most likely contaminant.  Changes to prescribed antibiotics required: No changes needed, continue current treatment for CAP.  Elenor Quinones, PharmD, BCPS Clinical Pharmacist Pager  [phone] 08/08/2016 5:17 PM

## 2016-08-08 NOTE — Progress Notes (Signed)
[place] TEAM 1 - Stepdown/ICU TEAM  Toni [name]  [id number] DOB: 10/27/1945 DOA: 08/05/2016 PCP: [name], MD    Brief Narrative:  70 y.o. woman with PAF (CHADS-VASc 4, anticoagulated with warfarin), multiple sclerosis (followed by Dr. Brett Fairy), central sleep apnea on BiPAP, HTN, GERD, and hypothyroidism who developed the acute onset of right hand weakness with decreased dexterity w/ transient word finding difficulty.  She denied facial droop or other focal deficits.  She has chronic RLE weakness which she attributes to her MS and felt this was unchanged.    In the ED Head CT was negative for acute process.  Neurology was consulted.  She was in atrial fibrillation with RVR, HR as high as 145.  Cardizem gtt was initiated.  Subjective:    afebrile overnight  Assessment & Plan: Right hand weakness - MS flair v/s TIA/CVA, cervical radiculopathy -Neurology consulted -MRI brain and C-spine w/wo contrast WNL Doubt CVA, INR  1.74 Neurology has signed off  Atrial fibrillation with RVR, RVR is improving -IV cardizem - currently on beta blocker and Cardizem PO and controlled , discontinue IV Cardizem  PAF (CHADS-VASc 4, anticoagulated with warfarin)   Fever Chest x-ray shows CAP, started antibiotics 12/18  UA negative on admission no growth from blood culture 2  Coagulopathy due to Warfarin  INR now corrected  GERD -PPI    Hypothyroidism -Continue home dose of levothyroxine  Recent TSH 1.08,   T4 1.34  Anxiety -Xanax prn, Wellbutrin   DVT prophylaxis: warfarin Code Status: FULL CODE Family Communication husband in the room Disposition Plan:  Anticipate discharge 1-2 days  Consultants:  Neurology   Procedures: 12/17 TTE  Antimicrobials:  none  Objective: Blood pressure (!) 102/54, pulse 77, temperature 98.1 F (36.7 C), temperature source Oral, resp. rate 15, height 5\' 3"  (1.6 m), weight 126.7 kg (279 lb 6.4 oz), SpO2 99 %.  Intake/Output  Summary (Last 24 hours) at 08/08/16 1217 Last data filed at 08/08/16 0600  Gross per 24 hour  Intake               50 ml  Output              450 ml  Net             -400 ml   Filed Weights   08/06/16 0009 08/06/16 0611  Weight: 117.9 kg (260 lb) 126.7 kg (279 lb 6.4 oz)    Examination: Pt was seen for a f/u visit.    CBC:  Recent Labs Lab 08/06/16 0125 08/06/16 0129 08/07/16 0142 08/08/16 0227  WBC 9.4  --  7.3 6.5  NEUTROABS 6.6  --   --   --   HGB 13.4 12.9 12.3 12.4  HCT 40.9 38.0 38.1 38.0  MCV 95.6  --  94.5 95.0  PLT 153  --  169 [id number]   Basic Metabolic Panel:  Recent Labs Lab 08/06/16 0125 08/06/16 0129 08/07/16 0142 08/08/16 0227  NA 139 138 135 141  K 4.7 4.4 3.7 3.7  CL 100* 100* 93* 93*  CO2 33*  --  32 38*  GLUCOSE 110* 110* 75 74  BUN 18 22* 16 18  CREATININE 0.84 0.70 0.82 0.81  CALCIUM 8.9  --  8.3* 8.4*   GFR: Estimated Creatinine Clearance: 85 mL/min (by C-G formula based on SCr of 0.81 mg/dL).  Liver Function Tests:  Recent Labs Lab 08/06/16 0125 08/07/16 0142 08/08/16 0227  AST 19 23 23   ALT  21 16 21   ALKPHOS 51 43 47  BILITOT 0.7 1.4* 0.7  PROT 6.1* 5.3* 5.6*  ALBUMIN 3.1* 2.7* 2.6*    Coagulation Profile:  Recent Labs Lab 08/06/16 0125 08/07/16 0142 08/08/16 0227  INR 4.36* 2.30 1.74    Cardiac Enzymes:  Recent Labs Lab 08/06/16 0647 08/06/16 1143 08/06/16 1833  TROPONINI <0.03 <0.03 0.05*    HbA1C: Hgb A1c MFr Bld  Date/Time Value Ref Range Status  04/21/2010 09:05 PM  <5.7 % Final   5.5 (NOTE)                                                                       According to the ADA Clinical Practice Recommendations for 2011, when HbA1c is used as a screening test:   >=6.5%   Diagnostic of Diabetes Mellitus           (if abnormal result  is confirmed)  5.7-6.4%   Increased risk of developing Diabetes Mellitus  References:Diagnosis and Classification of Diabetes Mellitus,Diabetes [id number]  1):S62-S69 and Standards of Medical Care in         Diabetes - 2011,Diabetes [id number]  (Suppl 1):S11-S61.    CBG: No results for input(s): GLUCAP in the last 168 hours.  Recent Results (from the past 240 hour(s))  MRSA PCR Screening     Status: None   Collection Time: 08/06/16  6:40 AM  Result Value Ref Range Status   MRSA by PCR NEGATIVE NEGATIVE Final    Comment:        The GeneXpert MRSA Assay (FDA approved for NASAL specimens only), is one component of a comprehensive MRSA colonization surveillance program. It is not intended to diagnose MRSA infection nor to guide or monitor treatment for MRSA infections.      Scheduled Meds: . azithromycin  500 mg Oral Daily  . buPROPion  300 mg Oral Daily  . cefTRIAXone (ROCEPHIN)  IV  1 g Intravenous Q24H  . cetirizine  10 mg Oral Daily  . diltiazem  120 mg Oral Daily  . famotidine  20 mg Oral QHS  . gabapentin  300 mg Oral TID  . lactulose  10 g Oral BID  . levothyroxine  75 mcg Oral QAC breakfast  . nebivolol  20 mg Oral QHS  . pantoprazole  40 mg Oral Daily  . simvastatin  10 mg Oral QHS  . tamsulosin  0.4 mg Oral Daily  . warfarin  7.5 mg Oral ONCE-1800  . Warfarin - Pharmacist Dosing Inpatient   Does not apply q1800   Continuous Infusions:    LOS: 1 day   Time spent: No Charge   [facility] Office  [phone] Pager - Text Page per Shea Evans as per below:  On-Call/Text Page:      [email or web address]      password [email or web address]  If 7PM-7AM, please contact night-coverage [email or web address] Password [email or web address] 08/08/2016, 12:17 PM

## 2016-08-08 NOTE — Progress Notes (Signed)
SLP Cancellation Note  Patient Details Name: Toni [name] MRN: [id number] DOB: August 19, 1946   Cancelled treatment:       Reason Eval/Treat Not Completed: Patient at procedure or test/unavailable. Pt off unit for MRI. RN aware, and reports no obvious deficits. Will continue efforts.  Celia B. [place], [facility], [place]  Shonna Chock 08/08/2016, 10:31 AM

## 2016-08-08 NOTE — Progress Notes (Signed)
Subjective: Continues to complain of mild diploplia and bilateral fingertip numbness, both symptoms that she has previously had.   Exam: Vitals:   08/08/16 0716 08/08/16 1504  BP: (!) 102/54 (!) 106/57  Pulse: 77 78  Resp: 15 16  Temp: 98.1 F (36.7 C) 99.1 F (37.3 C)   Gen: In bed, NAD Resp: non-labored breathing, no acute distress Abd: soft, nt  Neuro: MS: awake, alert, interactive and appropriate [id number], VFF, face symmetric, face sensation symmetric. No clear dysconjugate gaze, but diploplia is present Motor: 4/5 strength throughout the right side.  Sensory:decreased in right arm and leg.   Pertinent Labs: Low albumin    Impression: 70 yo F with right arm and leg weakness. Given the MRI without evidence of active demyelination I think that her neuro symptoms are recrudescnce in the setting of physiological stressor. I would not treat with steroids at this time.   Recommendations: 1) Treatment of underlying infectious process per IM 2) No further recommendations at this time. Please call with further questions or concerns, neuro will sign off.   Roland Rack, MD [facility] [phone]  If 7pm- 7am, please page neurology on call as listed in [place].

## 2016-08-08 NOTE — Progress Notes (Signed)
Pt received. Pt and husband oriented to room and equipment. VSS. Telemetry applied. Call light within reach, will continue to monitor.   Fritz Pickerel, RN

## 2016-08-09 DIAGNOSIS — Z7901 Long term (current) use of anticoagulants: Secondary | ICD-10-CM

## 2016-08-09 LAB — PROTIME-INR
INR: 1.91
Prothrombin Time: 22.1 seconds — ABNORMAL HIGH (ref 11.4–15.2)

## 2016-08-09 MED ORDER — LEVALBUTEROL HCL 0.63 MG/3ML IN NEBU: 0.6300 mg | INHALATION_SOLUTION | Freq: Four times a day (QID) | RESPIRATORY_TRACT | Status: DC | PRN

## 2016-08-09 MED ORDER — SALINE SPRAY 0.65 % NA SOLN
1.0000 | NASAL | Status: DC | PRN
  Filled 2016-08-09: qty 44

## 2016-08-09 MED ORDER — WARFARIN SODIUM 5 MG PO TABS
5.0000 mg | ORAL_TABLET | Freq: Once | ORAL | Status: AC
  Administered 2016-08-09: 5 mg via ORAL
  Filled 2016-08-09: qty 1

## 2016-08-09 MED ORDER — FUROSEMIDE 10 MG/ML IJ SOLN
40.0000 mg | Freq: Once | INTRAMUSCULAR | Status: AC
  Administered 2016-08-09: 40 mg via INTRAVENOUS
  Filled 2016-08-09: qty 4

## 2016-08-09 NOTE — Progress Notes (Signed)
ANTICOAGULATION CONSULT NOTE - Follow Up Consult  Pharmacy Consult for Coumadin Indication: atrial fibrillation  Allergies  Allergen Reactions  . Hydrocodone Nausea And Vomiting    Hydrocodone causes vomiting  . Oxycodone Nausea And Vomiting    Oral oxycodone causes vomiting  . Chlorhexidine Gluconate Rash    Sores at site  . Zoloft [Sertraline] Hives, Swelling and Rash    Patient Measurements: Height: 5\' 3"  (160 cm) Weight: 279 lb 6.4 oz (126.7 kg) IBW/kg (Calculated) : 52.4  Vital Signs: Temp: 97.7 F (36.5 C) (12/20 0512) Temp Source: Oral (12/20 0512) BP: 119/67 (12/20 0512) Pulse Rate: 66 (12/20 0512)  Labs:  Recent Labs  08/06/16 1833 08/07/16 0142 08/08/16 0227 08/09/16 0303  HGB  --  12.3 12.4  --   HCT  --  38.1 38.0  --   PLT  --  169 182  --   LABPROT  --  25.7* 20.6* 22.1*  INR  --  2.30 1.74 1.91  CREATININE  --  0.82 0.81  --   TROPONINI 0.05*  --   --   --     Estimated Creatinine Clearance: 85 mL/min (by C-G formula based on SCr of 0.81 mg/dL).  Assessment: 70yof on coumadin for afib. INR 4.36 on admit and doses held 12/16-17. Then restarted on 12/18. INR 1.91 this morning.   Home dose: 5mg  daily except 2.5mg  Sun/Wed - last taken 12/15  Goal of Therapy:  INR 2-3 Monitor platelets by anticoagulation protocol: Yes   Plan:  1) Repeat coumadin 5mg  x 1 2) Daily INR  Maryanna Shape, PharmD, BCPS  Clinical Pharmacist  Pager: [phone]   08/09/2016,12:19 PM

## 2016-08-09 NOTE — Consult Note (Signed)
            [facility] CM Primary Care Navigator  08/09/2016  SUMMA LOTSPEICH 10-Jun-1946 [id number]   Seenpatient at the bedside to identify possible discharge needs. Patient reports "had been getting weaker";"some trouble catching breath" and headaches that hadled to this admission. Patient endorses Dr.R. Avva with [facility] as the primary care provider.   Patient shared using [facility] and [facility] at [employer] obtain medications without difficulty.  Patientmanages herown medications at home using "pill box" system weekly.   Patient statesthat husband [name]) provides transportation to her doctors'appointments.  Her husband isthe primary caregiver at home as stated.  Discharge plan is home with home health services ([facility]) per patient.  Patient voiced understanding to call primary care provider's office when she gets home, for a post discharge follow-up appointment within a week or sooner if needs arise.Patient letter provided as a reminder.   Patient declined care management services/ disease management at this time. She denies any other needs or concerns for now. [facility] care management contact information provided for future needs that may arise.  For additional questions please contact:  Edwena Felty A. Versie Fleener, BSN, RN-BC [facility] PRIMARY CARE Navigator Cell: [phone]

## 2016-08-09 NOTE — Progress Notes (Signed)
Pt is on her NIV for the night tolerating it well

## 2016-08-09 NOTE — Progress Notes (Signed)
Inpatient Rehabilitation  OT has recommended IP rehab, however pt. does not have a diagnosis that would be amenable to a CIR admission at this time.  We are not recommending IP Rehab at this time.    [place] Admissions Coordinator Cell [phone] Office [phone]

## 2016-08-09 NOTE — Evaluation (Signed)
Speech Language Pathology Evaluation Patient Details Name: Toni [name] MRN: [id number] DOB: [birth year] Today's Date: 08/09/2016 Time: [id number] SLP Time Calculation (min) (ACUTE ONLY): 28 min  Problem List:  Patient Active Problem List   Diagnosis Date Noted  . Dyspnea   . Fever   . Atrial fibrillation with rapid ventricular response ([place]) 08/06/2016  . Right hand weakness 08/06/2016  . Pressure injury of skin 08/06/2016  . Nonischemic cardiomyopathy ([place]) 05/09/2016  . Localized edema 05/09/2016  . Chest pain at rest 02/03/2016  . Long term current use of anticoagulant therapy 02/03/2016  . Chest pain 01/19/2016  . Chronic atrial fibrillation ([place]) 01/19/2016  . 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 Atrial fibrillation with RVR ([place]) 01/19/2016  . Persistent atrial fibrillation with RVR ([place]) 01/19/2016  . Dysenteric diarrhea 10/04/2015  . Medication course changed 06/02/2015  . 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 Obesity hypoventilation syndrome ([place]) 03/30/2015  . Abnormality of gait 03/01/2015  . Chronic constipation with overflow incontinence 01/25/2015  . 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 Medication monitoring encounter 01/25/2015  . 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) ([place]) 01/25/2015  . 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 Acute cystitis with hematuria 08/24/2014  . MS (multiple sclerosis) ([place]) 07/29/2014  . Closed right ankle fracture 10/30/2013  . Leucocytosis 10/30/2013  . Ankle fracture 10/30/2013  . Varicose veins of both lower extremities with complications 0000000  . Paroxysmal a-fib ([place]) 06/04/2013  . Morbid obesity ([place]) 06/04/2013  . OSA on CPAP 06/04/2013  . Muscle spasms of lower extremity 02/24/2013  . Long term (current) use of anticoagulants 11/05/2012  . Multiple sclerosis, primary chronic progressive-diag  1989-Ab to IFN 11/26/2011  . Aspiration pneumonia vs Failed outpatient Rx of CAP 11/26/2011  . Hypertension   . Atrial fibrillation ([place])   . Thyroid disease   . HLD (hyperlipidemia)    Past Medical History:  Past Medical History:  Diagnosis Date  . Abnormality of gait 03/01/2015  . Anxiety   . Arthritis    "knees" (01/19/2016)  . Atrial fibrillation with RVR ([place]) 01/19/2016  . Childhood asthma   . Chronic pain    "nerve pain from the MS"  . Depression   . DVT (deep venous thrombosis) ([place]) 1983   "RLE; may have just been phlebitis; it was before the age of dopplers"  . 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 GERD (gastroesophageal reflux disease)   . History of stress test 06/21/10   limited exam with some degree of breast attenuatin of the apex however a component of apical ischemia is not excluded, EF 62%; cardiac cath   . HLD (hyperlipidemia)   . Hx of echocardiogram 04/22/10   EF 55-60%, no valve issues  . Hypertension   . Hypothyroidism   . Migraine    "none since I stopped beta blocker in 1990s" (01/19/2016)  . Multiple sclerosis ([place])    has had this may 1989-[place] reg doc  . OSA on CPAP   . PAF (paroxysmal atrial fibrillation) ([place])    on coumadin; documented on monitor 06/2010  . Pneumonia 11/2011  . Shortness of breath   . Thyroid disease    Past Surgical History:  Past Surgical History:  Procedure Laterality Date  . ANTERIOR CERVICAL DECOMP/DISCECTOMY FUSION  01/2004  . BACK SURGERY    .  CARDIAC CATHETERIZATION  06/22/2010   normal coronary arteries, PAF  . CATARACT EXTRACTION, BILATERAL Bilateral   . CHILD BIRTHS     X2  . DILATION AND CURETTAGE OF UTERUS    . INFUSION PUMP IMPLANTATION  ~ 2009   baclofen infusion in lower abd  . PAIN PUMP REVISION N/A 07/29/2014   Procedure: Baclofen pump replacement;  Surgeon: Erline Levine, MD;  Location: [facility];  Service: Neurosurgery;  Laterality: N/A;   Baclofen pump replacement  . TONSILLECTOMY AND ADENOIDECTOMY    . VARICOSE VEIN SURGERY  ~ 1968  . VENOUS ABLATION  12/16/2010   radiofreq ablation -Dr Elisabeth Cara and [facility]  . WISDOM TOOTH EXTRACTION     HPI:  70 y.o. woman with PAF (CHADS-VASc 4, anticoagulated with warfarin), multiple sclerosis (followed by Dr. Brett Fairy), central sleep apnea on BiPAP, HTN, GERD, and hypothyroidism who developed the acute onset of right hand weakness with decreased dexterity w/ transient word finding difficulty.  She denied facial droop or other focal deficits.  She has chronic RLE weakness which she attributes to her MS and felt this was unchanged. MRI 08/07/16 showedperiventricular predominant white matter lesions, also affecting the juxta cortical white matter. No infratentorial signal abnormalities, no detected progression, no restricted diffusion or enhancement to suggest active demyelination. No acute infarct, hemorrhage, hydrocephalus, or mass. Stable cerebral volume. Anti dependent foci at of fat signal (in density by CT) in the bilateral frontal horns of the lateral ventricles and around the vermis are stable. There is no enhancement or edema to suggest ventriculitis or meningitis.   Assessment / Plan / Recommendation Clinical Impression  Patient presents with cognitive communication status within normal limits. Patient was administered the MOCA, scoring 30/30. Patient complains of intermittent word-finding difficulties and occasional memory lapses, and states she feels she is at her cognitive-linguistic baseline. She demonstrates good judgement, safety awareness, problem solving, and language skills. Observed to independently and effectively utilize strategies for short-term memory. No further speech language pathology services are required at this time. SLP will sign off.     SLP Assessment  Patient does not need any further Speech Lanaguage Pathology Services    Follow Up Recommendations  Other (comment)  (patient states desire for rehabilitation)    Frequency and Duration           SLP Evaluation Cognition  Overall Cognitive Status: Within Functional Limits for tasks assessed Arousal/Alertness: Awake/alert Orientation Level: Oriented X4       Comprehension  Auditory Comprehension Overall Auditory Comprehension: Appears within functional limits for tasks assessed Visual Recognition/Discrimination Discrimination: Within Function Limits Reading Comprehension Reading Status: Within funtional limits    Expression Expression Primary Mode of Expression: Verbal Verbal Expression Overall Verbal Expression: Appears within functional limits for tasks assessed Written Expression Dominant Hand: Right Written Expression: Not tested   Oral / Motor  Oral Motor/Sensory Function Overall Oral Motor/Sensory Function: Within functional limits Motor Speech Overall Motor Speech: Appears within functional limits for tasks assessed   GO          Deneise Lever, MS CF-SLP Speech-Language Pathologist            Aliene Altes 08/09/2016, 5:09 PM

## 2016-08-09 NOTE — Progress Notes (Signed)
Physical Therapy Treatment Patient Details Name: Toni [name] MRN: [id number] DOB: 10-20-1945 Today's Date: 08/09/2016    History of Present Illness 70 y.o.woman with PAF (CHADS-VASc 4, anticoagulated with warfarin), multiple sclerosis (followed by Dr. Brett Fairy), central sleep apnea on BiPAP, HTN, GERD, and hypothyroidism who developed the acute onset of right hand weakness with decreased dexterity w/ transient word finding difficulty.  She denied facial droop or other focal deficits. She has chronic RLE weakness which she attributes to her MS and felt this was unchanged. She was in atrial fibrillation with RVR, HR as high as 145.     PT Comments    Pt with regression from functional mobility stand point. Pt now requiring assistx2 for OOB mobility.Pt was functioning at supervision level PTA. Pt reports "My R side has never been this weak in my whole life". Pt to benefit from CIR upon d/c to improve indep with mobility and decrease burden of care on spouse. If pt can not get into CIR pt will need ST-SNF upon d/c once medically stable.  Follow Up Recommendations  CIR     Equipment Recommendations  None recommended by PT    Recommendations for Other Services       Precautions / Restrictions Precautions Precautions: Fall Restrictions Weight Bearing Restrictions: No    Mobility  Bed Mobility Overal bed mobility: Needs Assistance Bed Mobility: Supine to Sit;Sit to Supine     Supine to sit: Min assist Sit to supine: Min assist   General bed mobility comments: mina for trunk elevation and to bring LEs back into bed, pt able to pull self up in bed with use of bed rails  Transfers Overall transfer level: Needs assistance Equipment used: Rolling walker (2 wheeled) Transfers: Sit to/from Stand Sit to Stand: Min assist;+2 physical assistance Stand pivot transfers: Min assist;+2 physical assistance;Mod assist       General transfer comment: pt stood with minA and took 2 steps  and then required mod/maxAx2 to ensure safe  completion of transfer to [facility] and then back to bed as pt's R LE buckled  Ambulation/Gait             General Gait Details: unable as pt with significant R LE weakness   Stairs            Wheelchair Mobility    Modified Rankin (Stroke Patients Only)       Balance Overall balance assessment: Needs assistance Sitting-balance support: Feet supported Sitting balance-Leahy Scale: Fair     Standing balance support: Bilateral upper extremity supported Standing balance-Leahy Scale: Zero Standing balance comment: stood x3 to perform hygiene, unable to maintains standing >15 seconds                    Cognition Arousal/Alertness: Awake/alert Behavior During Therapy: WFL for tasks assessed/performed Overall Cognitive Status: Within Functional Limits for tasks assessed                      Exercises      General Comments General comments (skin integrity, edema, etc.): pt assisted to [facility], unable to perform hygiene on self, dependent       Pertinent Vitals/Pain Pain Assessment: No/denies pain    Home Living                      Prior Function            PT Goals (current goals can now be found in the  care plan section) Acute Rehab PT Goals Patient Stated Goal: rehab and start sterioids Progress towards PT goals: Not progressing toward goals - comment (pt with regression/weaker than initial eval)    Frequency    Min 3X/week      PT Plan Discharge plan needs to be updated    Co-evaluation             End of Session Equipment Utilized During Treatment: Oxygen Activity Tolerance: Patient limited by fatigue Patient left: in bed;with call bell/phone within reach;with family/visitor present     Time: [id number] PT Time Calculation (min) (ACUTE ONLY): 17 min  Charges:  $Therapeutic Activity: 8-22 mins                    G Codes:      Comfort Iversen M Kramer Hanrahan 08-31-2016, 4:45 PM    Kittie Plater, PT, DPT Pager #: [phone] Office #: [phone]

## 2016-08-09 NOTE — Progress Notes (Addendum)
[facility] [facility] TEAM 1 - Stepdown/ICU TEAM  Toni [name]  [id number] DOB: 1945/09/13 DOA: 08/05/2016 PCP: [name], MD    Brief Narrative:  70 y.o. woman with PAF (CHADS-VASc 4, anticoagulated with warfarin), multiple sclerosis (followed by Dr. Brett Fairy), central sleep apnea on BiPAP, HTN, GERD, and hypothyroidism who developed the acute onset of right hand weakness with decreased dexterity w/ transient word finding difficulty.  She denied facial droop or other focal deficits.  She has chronic RLE weakness which she attributes to her MS and felt this was unchanged.    In the ED Head CT was negative for acute process.  Neurology was consulted.  She was in atrial fibrillation with RVR, HR as high as 145.  Cardizem gtt was initiated.  Subjective: Continues to have low-grade fever, no complaining of nasal congestion, shortness of breath,  Assessment & Plan: Right hand weakness -  MS flair v/s TIA/CVA, cervical radiculopathy ruled out by urology MRI brain and C-spine w/wo contrast WNL Neurology has signed off Patient concerned that this is an MS flare requesting steroids  Atrial fibrillation with RVR, RVR is improving -IV cardizem - currently on beta blocker and Cardizem PO and controlled , discontinue IV Cardizem  PAF (CHADS-VASc 4, anticoagulated with warfarin) Patient requesting Lasix for volume overload and bloating We will give 1 dose of Lasix today  Fever Chest x-ray shows CAP, started antibiotics 12/18  UA negative on admission BC  GRAM POSITIVE COCCI IN CLUSTERS  1/2 bottles  Likely contaminant  Will will wait for final culture results before discharging the patient  Coagulopathy due to Warfarin  INR now corrected  GERD -PPI    Hypothyroidism -Continue home dose of levothyroxine  Recent TSH 1.08,   T4 1.34  Anxiety -Xanax prn, Wellbutrin   DVT prophylaxis: warfarin Code Status: FULL CODE Family Communication husband in the room Disposition Plan:   Anticipate discharge 1-2 days  Consultants:  Neurology   Procedures: 12/17 TTE  Antimicrobials:  none  Objective: Blood pressure 119/67, pulse 66, temperature 97.7 F (36.5 C), temperature source Oral, resp. rate 17, height 5\' 3"  (1.6 m), weight 126.7 kg (279 lb 6.4 oz), SpO2 95 %.  Intake/Output Summary (Last 24 hours) at 08/09/16 1000 Last data filed at 08/08/16 1700  Gross per 24 hour  Intake              240 ml  Output              200 ml  Net               40 ml   Filed Weights   08/06/16 0009 08/06/16 0611  Weight: 117.9 kg (260 lb) 126.7 kg (279 lb 6.4 oz)    Examination: Pt was seen for a f/u visit.    CBC:  Recent Labs Lab 08/06/16 0125 08/06/16 0129 08/07/16 0142 08/08/16 0227  WBC 9.4  --  7.3 6.5  NEUTROABS 6.6  --   --   --   HGB 13.4 12.9 12.3 12.4  HCT 40.9 38.0 38.1 38.0  MCV 95.6  --  94.5 95.0  PLT 153  --  169 [id number]   Basic Metabolic Panel:  Recent Labs Lab 08/06/16 0125 08/06/16 0129 08/07/16 0142 08/08/16 0227  NA 139 138 135 141  K 4.7 4.4 3.7 3.7  CL 100* 100* 93* 93*  CO2 33*  --  32 38*  GLUCOSE 110* 110* 75 74  BUN 18 22* 16 18  CREATININE  0.84 0.70 0.82 0.81  CALCIUM 8.9  --  8.3* 8.4*   GFR: Estimated Creatinine Clearance: 85 mL/min (by C-G formula based on SCr of 0.81 mg/dL).  Liver Function Tests:  Recent Labs Lab 08/06/16 0125 08/07/16 0142 08/08/16 0227  AST 19 23 23   ALT 21 16 21   ALKPHOS 51 43 38  BILITOT 0.7 1.4* 0.7  PROT 6.1* 5.3* 5.6*  ALBUMIN 3.1* 2.7* 2.6*    Coagulation Profile:  Recent Labs Lab 08/06/16 0125 08/07/16 0142 08/08/16 0227 08/09/16 0303  INR 4.36* 2.30 1.74 1.91    Cardiac Enzymes:  Recent Labs Lab 08/06/16 0647 08/06/16 1143 08/06/16 1833  TROPONINI <0.03 <0.03 0.05*    HbA1C: Hgb A1c MFr Bld  Date/Time Value Ref Range Status  04/21/2010 09:05 PM  <5.7 % Final   5.5 (NOTE)                                                                       According to the  ADA Clinical Practice Recommendations for 2011, when HbA1c is used as a screening test:   >=6.5%   Diagnostic of Diabetes Mellitus           (if abnormal result  is confirmed)  5.7-6.4%   Increased risk of developing Diabetes Mellitus  References:Diagnosis and Classification of Diabetes Mellitus,Diabetes [id number] 1):S62-S69 and Standards of Medical Care in         Diabetes - 2011,Diabetes [id number]  (Suppl 1):S11-S61.    CBG: No results for input(s): GLUCAP in the last 168 hours.  Recent Results (from the past 240 hour(s))  MRSA PCR Screening     Status: None   Collection Time: 08/06/16  6:40 AM  Result Value Ref Range Status   MRSA by PCR NEGATIVE NEGATIVE Final    Comment:        The GeneXpert MRSA Assay (FDA approved for NASAL specimens only), is one component of a comprehensive MRSA colonization surveillance program. It is not intended to diagnose MRSA infection nor to guide or monitor treatment for MRSA infections.   Culture, blood (Routine X 2) w Reflex to ID Panel     Status: None (Preliminary result)   Collection Time: 08/07/16 12:16 PM  Result Value Ref Range Status   Specimen Description BLOOD BLOOD LEFT ARM  Final   Special Requests BOTTLES DRAWN AEROBIC AND ANAEROBIC 5CC  Final   Culture NO GROWTH < 24 HOURS  Final   Report Status PENDING  Incomplete  Culture, blood (Routine X 2) w Reflex to ID Panel     Status: None (Preliminary result)   Collection Time: 08/07/16 12:17 PM  Result Value Ref Range Status   Specimen Description BLOOD RIGHT ANTECUBITAL  Final   Special Requests BOTTLES DRAWN AEROBIC AND ANAEROBIC 5CC  Final   Culture  Setup Time   Final    GRAM POSITIVE COCCI IN CLUSTERS IN BOTH AEROBIC AND ANAEROBIC BOTTLES Organism ID to follow CRITICAL RESULT CALLED TO, READ BACK BY AND VERIFIED WITH: N. Batchelder Pharm.D. 17:15 08/08/16  ([email or web address])    Culture GRAM POSITIVE COCCI  Final   Report Status PENDING  Incomplete  Blood Culture ID Panel  (Reflexed)     Status: Abnormal   Collection Time: 08/07/16  12:17 PM  Result Value Ref Range Status   Enterococcus species NOT DETECTED NOT DETECTED Final   Listeria monocytogenes NOT DETECTED NOT DETECTED Final   Staphylococcus species DETECTED (A) NOT DETECTED Final    Comment: CRITICAL RESULT CALLED TO, READ BACK BY AND VERIFIED WITH: N. Batchelder Pharm.D. 17:15 08/08/16 ([email or web address])    Staphylococcus aureus NOT DETECTED NOT DETECTED Final   Methicillin resistance NOT DETECTED NOT DETECTED Final   Streptococcus species NOT DETECTED NOT DETECTED Final   Streptococcus agalactiae NOT DETECTED NOT DETECTED Final   Streptococcus pneumoniae NOT DETECTED NOT DETECTED Final   Streptococcus pyogenes NOT DETECTED NOT DETECTED Final   Acinetobacter baumannii NOT DETECTED NOT DETECTED Final   Enterobacteriaceae species NOT DETECTED NOT DETECTED Final   Enterobacter cloacae complex NOT DETECTED NOT DETECTED Final   Escherichia coli NOT DETECTED NOT DETECTED Final   Klebsiella oxytoca NOT DETECTED NOT DETECTED Final   Klebsiella pneumoniae NOT DETECTED NOT DETECTED Final   Proteus species NOT DETECTED NOT DETECTED Final   Serratia marcescens NOT DETECTED NOT DETECTED Final   Haemophilus influenzae NOT DETECTED NOT DETECTED Final   Neisseria meningitidis NOT DETECTED NOT DETECTED Final   Pseudomonas aeruginosa NOT DETECTED NOT DETECTED Final   Candida albicans NOT DETECTED NOT DETECTED Final   Candida glabrata NOT DETECTED NOT DETECTED Final   Candida krusei NOT DETECTED NOT DETECTED Final   Candida parapsilosis NOT DETECTED NOT DETECTED Final   Candida tropicalis NOT DETECTED NOT DETECTED Final     Scheduled Meds: . azithromycin  500 mg Oral Daily  . buPROPion  300 mg Oral Daily  . cefTRIAXone (ROCEPHIN)  IV  1 g Intravenous Q24H  . cetirizine  10 mg Oral Daily  . diltiazem  120 mg Oral Daily  . famotidine  20 mg Oral QHS  . gabapentin  300 mg Oral TID  . lactulose  10 g Oral BID  .  levothyroxine  75 mcg Oral QAC breakfast  . nebivolol  20 mg Oral QHS  . pantoprazole  40 mg Oral Daily  . simvastatin  10 mg Oral QHS  . sodium chloride  500 mL Intravenous Once  . tamsulosin  0.4 mg Oral Daily  . Warfarin - Pharmacist Dosing Inpatient   Does not apply q1800   Continuous Infusions:    LOS: 2 days   Time spent: No Charge   [facility] Office  [phone] Pager - Text Page per Shea Evans as per below:  On-Call/Text Page:      [email or web address]      password [email or web address]  If 7PM-7AM, please contact night-coverage [email or web address] Password [email or web address] 08/09/2016, 10:00 AM

## 2016-08-09 NOTE — Care Management Note (Signed)
Case Management Note Previous CM note initiated by Lacretia Leigh, RN 08/07/2016, 2:55 PM   Patient Details  Name: ASMA HARVARD MRN: [id number] Date of Birth: [birth year]  Subjective/Objective:    Adm w at fib                Action/Plan: lives at home, pcp dr Dagmar Hait. Act w adv homecare for hhpt.   Expected Discharge Date:                Expected Discharge Plan:  [place]  In-House Referral:     Discharge planning Services  CM Consult  Post Acute Care Choice:  Resumption of Svcs/PTA Provider, Home Health Choice offered to:  Patient  DME Arranged:    DME Agency:     HH Arranged:  PT, OT, Nurse's Aide [place] Agency:  [place]  Status of Service:  Completed, signed off  If discussed at [place] of Stay Meetings, dates discussed:    Discharge Disposition: home with home health   Additional Comments:   08/09/16- 1020- Aurther Harlin RN, CM- HH resumption orders have been placed with added OT/aide- to HHPT- ordered- [facility] following and will resume services on discharge.    Lacretia Leigh, RN 08/07/2016, 2:55 PM--will add hhot and bath aid to hhpt ref. Alerted jermaine w [facility] that pt in hospital. Will cont to follow.  Dahlia Client Collins, RN 08/09/2016, 10:21 AM  [phone]

## 2016-08-09 NOTE — Progress Notes (Signed)
Occupational Therapy Treatment Patient Details Name: Toni [name] MRN: [id number] DOB: Aug 11, 1946 Today's Date: 08/09/2016    History of present illness 70 y.o.woman with PAF (CHADS-VASc 4, anticoagulated with warfarin), multiple sclerosis (followed by Dr. Brett [name]), central sleep apnea on BiPAP, HTN, GERD, and hypothyroidism who developed the acute onset of right hand weakness with decreased dexterity w/ transient word finding difficulty.  She denied facial droop or other focal deficits. She has chronic RLE weakness which she attributes to her MS and felt this was unchanged. She was in atrial fibrillation with RVR, HR as high as 145.    OT comments  Pt requires increased amount of time to complete tasks.  She requires min A for bed mobility.   She was unable to stand today, and only able to perform stand pivot transfer to chair with min A.  Max A for toilet hygiene.  She feels she is weaker than she was PTA, and now states that her spouse will not be able to provide this level of care at discharge, and she will need rehab before returning home.  Screen for CIR placed, and discussed with MD.  Will follow.   Follow Up Recommendations  CIR    Equipment Recommendations  None recommended by OT    Recommendations for Other Services Rehab consult    Precautions / Restrictions Precautions Precautions: Fall       Mobility Bed Mobility Overal bed mobility: Needs Assistance Bed Mobility: Sit to Supine     Supine to sit: Min assist     General bed mobility comments: min A to lift trunk and scoot to EOB   Transfers Overall transfer level: Needs assistance Equipment used: 1 person hand held assist Transfers: Set designer Transfers;Sit to/from Stand     Squat pivot transfers: Min assist     General transfer comment: Attempted sit to stand x 1, but pt unable to achieve.  Pt performed squat pivot transfer with min A     Balance Overall balance assessment: Needs  assistance Sitting-balance support: Feet supported Sitting balance-Leahy Scale: Fair     Standing balance support: Bilateral upper extremity supported Standing balance-Leahy Scale: Zero Standing balance comment: unable to achieve standing                    ADL Overall ADL's : Needs assistance/impaired Eating/Feeding: Set up                       Toilet Transfer: Minimal assistance;Squat-pivot;BSC Toilet Transfer Details (indicate cue type and reason): unable to stand to pivot, therefore performed squat pivot  Toileting- Clothing Manipulation and Hygiene: Maximal assistance;Sitting/lateral lean;Bed level       Functional mobility during ADLs: Minimal assistance        Vision                     Perception     Praxis      Cognition   Behavior During Therapy: [facility] for tasks assessed/performed Overall Cognitive Status: Within Functional Limits for tasks assessed                       Extremity/Trunk Assessment               Exercises     Shoulder Instructions       General Comments      Pertinent Vitals/ Pain       Pain Assessment: No/denies pain  Home Living                                          Prior Functioning/Environment              Frequency  Min 2X/week        Progress Toward Goals  OT Goals(current goals can now be found in the care plan section)  Progress towards OT goals: Progressing toward goals     Plan Discharge plan needs to be updated    Co-evaluation                 End of Session Equipment Utilized During Treatment: Oxygen;Gait belt   Activity Tolerance Patient tolerated treatment well   Patient Left in chair;with call bell/phone within reach;with chair alarm set   Nurse Communication Mobility status        Time: [id number] OT Time Calculation (min): 44 min  Charges: OT General Charges $OT Visit: 1 Procedure OT Treatments $Self Care/Home  Management : 8-22 mins $Therapeutic Activity: 23-37 mins  Toni [name] M 08/09/2016, 12:12 PM

## 2016-08-10 DIAGNOSIS — R5081 Fever presenting with conditions classified elsewhere: Secondary | ICD-10-CM

## 2016-08-10 LAB — GLUCOSE, CAPILLARY: GLUCOSE-CAPILLARY: 158 mg/dL — AB (ref 65–99)

## 2016-08-10 LAB — CULTURE, BLOOD (ROUTINE X 2)

## 2016-08-10 LAB — PROTIME-INR
INR: 2.89
Prothrombin Time: 30.8 seconds — ABNORMAL HIGH (ref 11.4–15.2)

## 2016-08-10 MED ORDER — WARFARIN SODIUM 2 MG PO TABS
2.0000 mg | ORAL_TABLET | Freq: Once | ORAL | Status: AC
  Administered 2016-08-10: 2 mg via ORAL
  Filled 2016-08-10: qty 1

## 2016-08-10 MED ORDER — SODIUM CHLORIDE 0.9 % IV SOLN
500.0000 mg | Freq: Every day | INTRAVENOUS | Status: AC
  Administered 2016-08-10 – 2016-08-12 (×3): 500 mg via INTRAVENOUS
  Filled 2016-08-10 (×3): qty 4

## 2016-08-10 MED ORDER — INSULIN ASPART 100 UNIT/ML ~~LOC~~ SOLN
0.0000 [IU] | Freq: Three times a day (TID) | SUBCUTANEOUS | Status: DC
  Administered 2016-08-11 (×2): 3 [IU] via SUBCUTANEOUS
  Administered 2016-08-12: 2 [IU] via SUBCUTANEOUS

## 2016-08-10 MED ORDER — INSULIN ASPART 100 UNIT/ML ~~LOC~~ SOLN: 0.0000 [IU] | Freq: Every day | SUBCUTANEOUS | Status: DC

## 2016-08-10 NOTE — NC FL2 (Signed)
[place] LEVEL OF CARE SCREENING TOOL     IDENTIFICATION  Patient Name: Toni [name] Birthdate: 16-Nov-1945 Sex: female Admission Date (Current Location): 08/05/2016  [facility] and [place] Number:  Herbalist and Address:  [facility] [place] [address], [place], [place] [address]      Provider Number: [id number]  Attending Physician Name and Address:  Reyne Dumas, MD  Relative Name and Phone Number:  Mariapaula Grisanti, [phone]    Current Level of Care: Hospital Recommended Level of Care: [place] Prior Approval Number:    Date Approved/Denied:   PASRR Number: [id number]  Discharge Plan: SNF    Current Diagnoses: Patient Active Problem List   Diagnosis Date Noted  . Dyspnea   . Fever   . Atrial fibrillation with rapid ventricular response ([place]) 08/06/2016  . Right hand weakness 08/06/2016  . Pressure injury of skin 08/06/2016  . Nonischemic cardiomyopathy ([place]) 05/09/2016  . Localized edema 05/09/2016  . Chest pain at rest 02/03/2016  . Long term current use of anticoagulant therapy 02/03/2016  . Chest pain 01/19/2016  . Chronic atrial fibrillation ([place]) 01/19/2016  . 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 Atrial fibrillation with RVR ([place]) 01/19/2016  . Persistent atrial fibrillation with RVR ([place]) 01/19/2016  . Dysenteric diarrhea 10/04/2015  . Medication course changed 06/02/2015  . 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 Obesity hypoventilation syndrome ([place]) 03/30/2015  . Abnormality of gait 03/01/2015  . Chronic constipation with overflow incontinence 01/25/2015  . 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 Medication monitoring encounter 01/25/2015  . 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) ([place]) 01/25/2015  . 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 Acute cystitis with hematuria  08/24/2014  . MS (multiple sclerosis) ([place]) 07/29/2014  . Closed right ankle fracture 10/30/2013  . Leucocytosis 10/30/2013  . Ankle fracture 10/30/2013  . Varicose veins of both lower extremities with complications 0000000  . Paroxysmal a-fib ([place]) 06/04/2013  . Morbid obesity ([place]) 06/04/2013  . OSA on CPAP 06/04/2013  . Muscle spasms of lower extremity 02/24/2013  . Long term (current) use of anticoagulants 11/05/2012  . 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 Aspiration pneumonia vs Failed outpatient Rx of CAP 11/26/2011  . Hypertension   . Atrial fibrillation ([place])   . Thyroid disease   . HLD (hyperlipidemia)     Orientation RESPIRATION BLADDER Height & Weight     Self, Time, Situation, Place  Normal Continent Weight: 279 lb 6.4 oz (126.7 kg) Height:  5\' 3"  (160 cm)  BEHAVIORAL SYMPTOMS/MOOD NEUROLOGICAL BOWEL NUTRITION STATUS      Continent Diet (diet regular)  AMBULATORY STATUS COMMUNICATION OF NEEDS Skin   Extensive Assist Verbally Normal                       Personal Care Assistance Level of Assistance  Bathing, Feeding, Dressing Bathing Assistance: Limited assistance Feeding assistance: Independent Dressing Assistance: Limited assistance     Functional Limitations Info  Sight, Hearing, Speech Sight Info: Adequate Hearing Info: Adequate Speech Info: Adequate    SPECIAL CARE FACTORS FREQUENCY  PT (By licensed PT), OT (By licensed OT)     PT Frequency: 5x week OT Frequency: 5x week            Contractures Contractures Info: Not present    Additional Factors Info  Code Status, Allergies Code Status Info: Full Code Allergies Info: HYDROCODONE, OXYCODONE, CHLORHEXIDINE GLUCONATE, ZOLOFT            Current Medications (08/10/2016):  This is the current hospital active medication list Current Facility-Administered Medications  Medication Dose Route Frequency Provider Last Rate Last Dose  .  acetaminophen (TYLENOL) tablet 650 mg  650 mg Oral Q4H PRN Cherene Altes, MD   650 mg at 08/09/16 1651   Or  . acetaminophen (TYLENOL) suppository 650 mg  650 mg Rectal Q4H PRN Cherene Altes, MD      . ALPRAZolam Duanne Moron) tablet 0.5 mg  0.5 mg Oral BID PRN Lily Kocher, MD   0.5 mg at 08/07/16 2112  . azithromycin (ZITHROMAX) tablet 500 mg  500 mg Oral Daily Reyne Dumas, MD   500 mg at 08/10/16 1200  . baclofen (LIORESAL) tablet 20 mg  20 mg Oral BID PRN Lily Kocher, MD      . buPROPion (WELLBUTRIN XL) 24 hr tablet 300 mg  300 mg Oral Daily Lily Kocher, MD   300 mg at 08/10/16 1159  . cefTRIAXone (ROCEPHIN) 1 g in dextrose 5 % 50 mL IVPB  1 g Intravenous Q24H Reyne Dumas, MD   1 g at 08/10/16 1259  . cetirizine (ZYRTEC) tablet 10 mg  10 mg Oral Daily Cherene Altes, MD   10 mg at 08/10/16 1200  . diltiazem (CARDIZEM CD) 24 hr capsule 120 mg  120 mg Oral Daily Cherene Altes, MD   120 mg at 08/10/16 1159  . famotidine (PEPCID) tablet 20 mg  20 mg Oral QHS Lily Kocher, MD   20 mg at 08/09/16 2040  . gabapentin (NEURONTIN) capsule 300 mg  300 mg Oral TID Lily Kocher, MD   300 mg at 08/10/16 0817  . lactulose (CHRONULAC) 10 GM/15ML solution 10 g  10 g Oral BID Reyne Dumas, MD   10 g at 08/10/16 1159  . levalbuterol (XOPENEX) nebulizer solution 0.63 mg  0.63 mg Nebulization Q6H PRN Reyne Dumas, MD      . levothyroxine (SYNTHROID, LEVOTHROID) tablet 75 mcg  75 mcg Oral QAC breakfast Lily Kocher, MD   75 mcg at 08/10/16 0650  . methylPREDNISolone sodium succinate (SOLU-MEDROL) 500 mg in sodium chloride 0.9 % 50 mL IVPB  500 mg Intravenous Daily Reyne Dumas, MD   500 mg at 08/10/16 1200  . nebivolol (BYSTOLIC) tablet 20 mg  20 mg Oral QHS Cherene Altes, MD   20 mg at 08/09/16 2040  . pantoprazole (PROTONIX) EC tablet 40 mg  40 mg Oral Daily Lily Kocher, MD   40 mg at 08/10/16 1159  . simvastatin (ZOCOR) tablet 10 mg  10 mg Oral QHS Lily Kocher, MD   10 mg at 08/09/16 2040  .  sodium chloride (OCEAN) 0.65 % nasal spray 1 spray  1 spray Each Nare PRN Reyne Dumas, MD      . sodium chloride 0.9 % bolus 500 mL  500 mL Intravenous Once Reyne Dumas, MD      . tamsulosin (FLOMAX) capsule 0.4 mg  0.4 mg Oral Daily Lily Kocher, MD   0.4 mg at 08/10/16 1159  . warfarin (COUMADIN) tablet 2 mg  2 mg Oral ONCE-1800 Crystal Carnuel, [facility]      . Warfarin - Pharmacist Dosing Inpatient   Does not apply [place], [facility]         Discharge Medications: Please see discharge summary for a  list of discharge medications.  Relevant Imaging Results:  Relevant Lab Results:   Additional [place], LCSW

## 2016-08-10 NOTE — Clinical Social Work Note (Signed)
Clinical Social Work Assessment  Patient Details  Name: RAYNELL UPTON MRN: [id number] Date of Birth: 08/18/1946  Date of referral:  08/10/16               Reason for consult:  Discharge Planning                Permission sought to share information with:  Family Supports Permission granted to share information::  Yes, Verbal Permission Granted  Name::     Taneasha Fuqua  Agency::     Relationship::  spouse  Contact Information:  [phone]  Housing/Transportation Living arrangements for the past 2 months:  Single Family Home Source of Information:  Patient Patient Interpreter Needed:  None Criminal Activity/Legal Involvement Pertinent to Current Situation/Hospitalization:  No - Comment as needed Significant Relationships:  Adult Children, Spouse Lives with:  Spouse Do you feel safe going back to the place where you live?  No Need for family participation in patient care:  Yes (Comment)  Care giving concerns:  patient husband was in the room with patient during discharge. patient stated she has family members in the [place] area.  Social Worker assessment / plan: [place] representative met patient at bedside to offer support and discuss patients needs at discharge. Patient states she lives at home with husband and has family in the area. Patient is agreeable for SNF placement and would prefer [facility] since she has been at their facility in the past. Patient stated if [facility] does not have a bed available that she will look into other SNF in the [place] area. CSW to follow up with patient once bed offers are available. CSW remains available for support and to facilitate patient discharge needs once medically ready.  Employment status:  Retired Forensic scientist:  Medicare PT Recommendations:  [place] / Referral to community resources:  [place]  Patient/Family's Response to care:  Patient verbalized appreciation and  understanding for CSW role and involvement in care. Patient agreeable with current discharge plan to SNF following discharge.   Patient/Family's Understanding of and Emotional Response to Diagnosis, Current Treatment, and Prognosis:  Patient with good understanding of current medical state and limitations around most recent hospitalization. Patient is agreeable with SNF placement in hopes of going home.   Emotional Assessment Appearance:  Appears stated age Attitude/Demeanor/Rapport:  Other (attitude/demeanor appropriate ) Affect (typically observed):  Calm Orientation:  Oriented to  Time, Oriented to Situation, Oriented to Place, Oriented to Self Alcohol / Substance use:  Not Applicable Psych involvement (Current and /or in the community):  No (Comment)  Discharge Needs  Concerns to be addressed:  No discharge needs identified Readmission within the last 30 days:    Current discharge risk:  None Barriers to Discharge:  No Barriers Identified   Rhea Pink, MSW,  [place]

## 2016-08-10 NOTE — Progress Notes (Addendum)
[place] TEAM 1 - Stepdown/ICU TEAM  NYKERA RUNKEL  [id number] DOB: 11/28/1945 DOA: 08/05/2016 PCP: [name], MD    Brief Narrative:  70 y.o. woman with PAF (CHADS-VASc 4, anticoagulated with warfarin), multiple sclerosis (followed by Dr. Brett Fairy), central sleep apnea on BiPAP, HTN, GERD, and hypothyroidism who developed the acute onset of right hand weakness with decreased dexterity w/ transient word finding difficulty.  She denied facial droop or other focal deficits.  She has chronic RLE weakness which she attributes to her MS and felt this was unchanged.   found to have community-acquired pneumonia and MS flare  Subjective: Improved  nasal congestion, shortness of breath,had a nose bleed this am   Assessment & Plan: Right hand weakness -  MS flair v/s TIA/CVA, cervical radiculopathy ruled out by neurology MRI brain and C-spine w/wo contrast WNL Neurology has signed off Patient concerned that this is an MS flare requesting steroids, discussed with Dr. Frederik Pear  recommended IV Solu-Medrol 500 mg a day 3 days CIR has been denied , start looking for SNF vs home health    Atrial fibrillation with RVR, RVR resolved -Initially given IV cardizem - currently on beta blocker and Cardizem PO and controlled ,  PAF (CHADS-VASc 4, anticoagulated with warfarin) Patient requesting Lasix for volume overload and bloating, status post one dose on 12/20     Fever Chest x-ray shows CAP, started antibiotics 12/18-12/24 UA negative on admission BC  GRAM POSITIVE COCCI IN CLUSTERS 1/2 bottles  Likely contaminant  Will will wait for final culture results before discharging the patient   Coagulopathy due to Warfarin  INR now corrected  GERD -PPI    Hypothyroidism -Continue home dose of levothyroxine  Recent TSH 1.08,   T4 1.34  Anxiety -Xanax prn, Wellbutrin  NOSE bleed Resolved  Continue ocean nasal spray   DVT prophylaxis: warfarin Code Status: FULL  CODE Family Communication husband in the room Disposition Plan:  Anticipate discharge  12/23  Consultants:  Neurology   Procedures: 12/17 TTE  Antimicrobials:  none  Objective: Blood pressure 117/68, pulse 85, temperature 98.5 F (36.9 C), temperature source Oral, resp. rate 20, height 5\' 3"  (1.6 m), weight 126.7 kg (279 lb 6.4 oz), SpO2 98 %.  Intake/Output Summary (Last 24 hours) at 08/10/16 0947 Last data filed at 08/09/16 1309  Gross per 24 hour  Intake              240 ml  Output                0 ml  Net              240 ml   Filed Weights   08/06/16 0009 08/06/16 0611  Weight: 117.9 kg (260 lb) 126.7 kg (279 lb 6.4 oz)    Examination: Pt was seen for a f/u visit.    CBC:  Recent Labs Lab 08/06/16 0125 08/06/16 0129 08/07/16 0142 08/08/16 0227  WBC 9.4  --  7.3 6.5  NEUTROABS 6.6  --   --   --   HGB 13.4 12.9 12.3 12.4  HCT 40.9 38.0 38.1 38.0  MCV 95.6  --  94.5 95.0  PLT 153  --  169 [id number]   Basic Metabolic Panel:  Recent Labs Lab 08/06/16 0125 08/06/16 0129 08/07/16 0142 08/08/16 0227  NA 139 138 135 141  K 4.7 4.4 3.7 3.7  CL 100* 100* 93* 93*  CO2 33*  --  32 38*  GLUCOSE 110*  110* 75 74  BUN 18 22* 16 18  CREATININE 0.84 0.70 0.82 0.81  CALCIUM 8.9  --  8.3* 8.4*   GFR: Estimated Creatinine Clearance: 85 mL/min (by C-G formula based on SCr of 0.81 mg/dL).  Liver Function Tests:  Recent Labs Lab 08/06/16 0125 08/07/16 0142 08/08/16 0227  AST 19 23 23   ALT 21 16 21   ALKPHOS 51 43 47  BILITOT 0.7 1.4* 0.7  PROT 6.1* 5.3* 5.6*  ALBUMIN 3.1* 2.7* 2.6*    Coagulation Profile:  Recent Labs Lab 08/06/16 0125 08/07/16 0142 08/08/16 0227 08/09/16 0303 08/10/16 0238  INR 4.36* 2.30 1.74 1.91 2.89    Cardiac Enzymes:  Recent Labs Lab 08/06/16 0647 08/06/16 1143 08/06/16 1833  TROPONINI <0.03 <0.03 0.05*    HbA1C: Hgb A1c MFr Bld  Date/Time Value Ref Range Status  04/21/2010 09:05 PM  <5.7 % Final   5.5 (NOTE)                                                                        According to the ADA Clinical Practice Recommendations for 2011, when HbA1c is used as a screening test:   >=6.5%   Diagnostic of Diabetes Mellitus           (if abnormal result  is confirmed)  5.7-6.4%   Increased risk of developing Diabetes Mellitus  References:Diagnosis and Classification of Diabetes Mellitus,Diabetes [id number] 1):S62-S69 and Standards of Medical Care in         Diabetes - 2011,Diabetes [id number]  (Suppl 1):S11-S61.    CBG: No results for input(s): GLUCAP in the last 168 hours.  Recent Results (from the past 240 hour(s))  MRSA PCR Screening     Status: None   Collection Time: 08/06/16  6:40 AM  Result Value Ref Range Status   MRSA by PCR NEGATIVE NEGATIVE Final    Comment:        The GeneXpert MRSA Assay (FDA approved for NASAL specimens only), is one component of a comprehensive MRSA colonization surveillance program. It is not intended to diagnose MRSA infection nor to guide or monitor treatment for MRSA infections.   Culture, blood (Routine X 2) w Reflex to ID Panel     Status: None (Preliminary result)   Collection Time: 08/07/16 12:16 PM  Result Value Ref Range Status   Specimen Description BLOOD BLOOD LEFT ARM  Final   Special Requests BOTTLES DRAWN AEROBIC AND ANAEROBIC 5CC  Final   Culture NO GROWTH 2 DAYS  Final   Report Status PENDING  Incomplete  Culture, blood (Routine X 2) w Reflex to ID Panel     Status: Abnormal   Collection Time: 08/07/16 12:17 PM  Result Value Ref Range Status   Specimen Description BLOOD RIGHT ANTECUBITAL  Final   Special Requests BOTTLES DRAWN AEROBIC AND ANAEROBIC 5CC  Final   Culture  Setup Time   Final    GRAM POSITIVE COCCI IN CLUSTERS IN BOTH AEROBIC AND ANAEROBIC BOTTLES Organism ID to follow CRITICAL RESULT CALLED TO, READ BACK BY AND VERIFIED WITH: N. Batchelder Pharm.D. 17:15 08/08/16  ([email or web address])    Culture (A)  Final     STAPHYLOCOCCUS SPECIES (COAGULASE NEGATIVE) THE SIGNIFICANCE OF ISOLATING THIS ORGANISM FROM A  SINGLE SET OF BLOOD CULTURES WHEN MULTIPLE SETS ARE DRAWN IS UNCERTAIN. PLEASE NOTIFY THE MICROBIOLOGY DEPARTMENT WITHIN ONE WEEK IF SPECIATION AND SENSITIVITIES ARE REQUIRED.    Report Status 08/10/2016 FINAL  Final  Blood Culture ID Panel (Reflexed)     Status: Abnormal   Collection Time: 08/07/16 12:17 PM  Result Value Ref Range Status   Enterococcus species NOT DETECTED NOT DETECTED Final   Listeria monocytogenes NOT DETECTED NOT DETECTED Final   Staphylococcus species DETECTED (A) NOT DETECTED Final    Comment: CRITICAL RESULT CALLED TO, READ BACK BY AND VERIFIED WITH: N. Batchelder Pharm.D. 17:15 08/08/16 ([email or web address])    Staphylococcus aureus NOT DETECTED NOT DETECTED Final   Methicillin resistance NOT DETECTED NOT DETECTED Final   Streptococcus species NOT DETECTED NOT DETECTED Final   Streptococcus agalactiae NOT DETECTED NOT DETECTED Final   Streptococcus pneumoniae NOT DETECTED NOT DETECTED Final   Streptococcus pyogenes NOT DETECTED NOT DETECTED Final   Acinetobacter baumannii NOT DETECTED NOT DETECTED Final   Enterobacteriaceae species NOT DETECTED NOT DETECTED Final   Enterobacter cloacae complex NOT DETECTED NOT DETECTED Final   Escherichia coli NOT DETECTED NOT DETECTED Final   Klebsiella oxytoca NOT DETECTED NOT DETECTED Final   Klebsiella pneumoniae NOT DETECTED NOT DETECTED Final   Proteus species NOT DETECTED NOT DETECTED Final   Serratia marcescens NOT DETECTED NOT DETECTED Final   Haemophilus influenzae NOT DETECTED NOT DETECTED Final   Neisseria meningitidis NOT DETECTED NOT DETECTED Final   Pseudomonas aeruginosa NOT DETECTED NOT DETECTED Final   Candida albicans NOT DETECTED NOT DETECTED Final   Candida glabrata NOT DETECTED NOT DETECTED Final   Candida krusei NOT DETECTED NOT DETECTED Final   Candida parapsilosis NOT DETECTED NOT DETECTED Final   Candida tropicalis  NOT DETECTED NOT DETECTED Final     Scheduled Meds: . azithromycin  500 mg Oral Daily  . buPROPion  300 mg Oral Daily  . cefTRIAXone (ROCEPHIN)  IV  1 g Intravenous Q24H  . cetirizine  10 mg Oral Daily  . diltiazem  120 mg Oral Daily  . famotidine  20 mg Oral QHS  . gabapentin  300 mg Oral TID  . lactulose  10 g Oral BID  . levothyroxine  75 mcg Oral QAC breakfast  . nebivolol  20 mg Oral QHS  . pantoprazole  40 mg Oral Daily  . simvastatin  10 mg Oral QHS  . sodium chloride  500 mL Intravenous Once  . tamsulosin  0.4 mg Oral Daily  . Warfarin - Pharmacist Dosing Inpatient   Does not apply q1800   Continuous Infusions:    LOS: 3 days   Time spent: No Charge   [facility] Office  [phone] Pager - Text Page per Shea Evans as per below:  On-Call/Text Page:      [email or web address]      password [email or web address]  If 7PM-7AM, please contact night-coverage [email or web address] Password [email or web address] 08/10/2016, 9:47 AM

## 2016-08-10 NOTE — Clinical Social Work Placement (Signed)
   CLINICAL SOCIAL WORK PLACEMENT  NOTE  Date:  08/10/2016  Patient Details  Name: Toni [name] MRN: [id number] Date of Birth: Jan 24, 1946  Clinical Social Work is seeking post-discharge placement for this patient at the [place] level of care (*CSW will initial, date and re-position this form in  chart as items are completed):  Yes   Patient/family provided with [facility] Work Department's list of facilities offering this level of care within the geographic area requested by the patient (or if unable, by the patient's family).  Yes   Patient/family informed of their freedom to choose among providers that offer the needed level of care, that participate in Medicare, Medicaid or managed care program needed by the patient, have an available bed and are willing to accept the patient.  Yes   Patient/family informed of [facility]'s ownership interest in [facility] and [facility], as well as of the fact that they are under no obligation to receive care at these facilities.  PASRR submitted to EDS on       PASRR number received on       Existing PASRR number confirmed on 08/10/16     FL2 transmitted to all facilities in geographic area requested by pt/family on       FL2 transmitted to all facilities within larger geographic area on       Patient informed that his/her managed care company has contracts with or will negotiate with certain facilities, including the following:            Patient/family informed of bed offers received.  Patient chooses bed at       Physician recommends and patient chooses bed at      Patient to be transferred to   on  .  Patient to be transferred to facility by       Patient family notified on   of transfer.  Name of family member notified:        PHYSICIAN Please sign FL2     Additional Comment:    _______________________________________________ Wende Neighbors, LCSW 08/10/2016, 3:46 PM

## 2016-08-10 NOTE — Discharge Instructions (Signed)
Information on my medicine - Coumadin®   (Warfarin) ° °This medication education was reviewed with me or my healthcare representative as part of my discharge preparation.  The pharmacist that spoke with me during my hospital stay was:  Bama Hanselman Stillinger, RPH ° °Why was Coumadin prescribed for you? °Coumadin was prescribed for you because you have a blood clot or a medical condition that can cause an increased risk of forming blood clots. Blood clots can cause serious health problems by blocking the flow of blood to the heart, lung, or brain. Coumadin can prevent harmful blood clots from forming. °As a reminder your indication for Coumadin is:   Stroke Prevention Because Of Atrial Fibrillation ° °What test will check on my response to Coumadin? °While on Coumadin (warfarin) you will need to have an INR test regularly to ensure that your dose is keeping you in the desired range. The INR (international normalized ratio) number is calculated from the result of the laboratory test called prothrombin time (PT). ° °If an INR APPOINTMENT HAS NOT ALREADY BEEN MADE FOR YOU please schedule an appointment to have this lab work done by your health care provider within 7 days. °Your INR goal is usually a number between:  2 to 3 or your provider may give you a more narrow range like 2-2.5.  Ask your health care provider during an office visit what your goal INR is. ° °What  do you need to  know  About  COUMADIN? °Take Coumadin (warfarin) exactly as prescribed by your healthcare provider about the same time each day.  DO NOT stop taking without talking to the doctor who prescribed the medication.  Stopping without other blood clot prevention medication to take the place of Coumadin may increase your risk of developing a new clot or stroke.  Get refills before you run out. ° °What do you do if you miss a dose? °If you miss a dose, take it as soon as you remember on the same day then continue your regularly scheduled  regimen the next day.  Do not take two doses of Coumadin at the same time. ° °Important Safety Information °A possible side effect of Coumadin (Warfarin) is an increased risk of bleeding. You should call your healthcare provider right away if you experience any of the following: °  Bleeding from an injury or your nose that does not stop. °  Unusual colored urine (red or dark brown) or unusual colored stools (red or black). °  Unusual bruising for unknown reasons. °  A serious fall or if you hit your head (even if there is no bleeding). ° °Some foods or medicines interact with Coumadin® (warfarin) and might alter your response to warfarin. To help avoid this: °  Eat a balanced diet, maintaining a consistent amount of Vitamin K. °  Notify your provider about major diet changes you plan to make. °  Avoid alcohol or limit your intake to 1 drink for women and 2 drinks for men per day. °(1 drink is 5 oz. wine, 12 oz. beer, or 1.5 oz. liquor.) ° °Make sure that ANY health care provider who prescribes medication for you knows that you are taking Coumadin (warfarin).  Also make sure the healthcare provider who is monitoring your Coumadin knows when you have started a new medication including herbals and non-prescription products. ° °Coumadin® (Warfarin)  Major Drug Interactions  °Increased Warfarin Effect Decreased Warfarin Effect  °Alcohol (large quantities) °Antibiotics (esp. Septra/Bactrim, Flagyl, Cipro) °Amiodarone (Cordarone) °Aspirin (  ASA) °Cimetidine (Tagamet) °Megestrol (Megace) °NSAIDs (ibuprofen, naproxen, etc.) °Piroxicam (Feldene) °Propafenone (Rythmol SR) °Propranolol (Inderal) °Isoniazid (INH) °Posaconazole (Noxafil) Barbiturates (Phenobarbital) °Carbamazepine (Tegretol) °Chlordiazepoxide (Librium) °Cholestyramine (Questran) °Griseofulvin °Oral Contraceptives °Rifampin °Sucralfate (Carafate) °Vitamin K  ° °Coumadin® (Warfarin) Major Herbal Interactions  °Increased Warfarin Effect Decreased Warfarin Effect    °Garlic °Ginseng °Ginkgo biloba Coenzyme Q10 °Green tea °St. John’s wort   ° °Coumadin® (Warfarin) FOOD Interactions  °Eat a consistent number of servings per week of foods HIGH in Vitamin K °(1 serving = ½ cup)  °Collards (cooked, or boiled & drained) °Kale (cooked, or boiled & drained) °Mustard greens (cooked, or boiled & drained) °Parsley *serving size only = ¼ cup °Spinach (cooked, or boiled & drained) °Swiss chard (cooked, or boiled & drained) °Turnip greens (cooked, or boiled & drained)  °Eat a consistent number of servings per week of foods MEDIUM-HIGH in Vitamin K °(1 serving = 1 cup)  °Asparagus (cooked, or boiled & drained) °Broccoli (cooked, boiled & drained, or raw & chopped) °Brussel sprouts (cooked, or boiled & drained) *serving size only = ½ cup °Lettuce, raw (green leaf, endive, romaine) °Spinach, raw °Turnip greens, raw & chopped  ° °These websites have more information on Coumadin (warfarin):  www.coumadin.com; °www.ahrq.gov/consumer/coumadin.htm; ° ° ° °

## 2016-08-10 NOTE — Progress Notes (Signed)
ANTICOAGULATION CONSULT NOTE - Follow Up Consult  Pharmacy Consult for Coumadin Indication: atrial fibrillation  Allergies  Allergen Reactions  . Hydrocodone Nausea And Vomiting    Hydrocodone causes vomiting  . Oxycodone Nausea And Vomiting    Oral oxycodone causes vomiting  . Chlorhexidine Gluconate Rash    Sores at site  . Zoloft [Sertraline] Hives, Swelling and Rash    Patient Measurements: Height: 5\' 3"  (160 cm) Weight: 279 lb 6.4 oz (126.7 kg) IBW/kg (Calculated) : 52.4  Vital Signs: Temp: 98.5 F (36.9 C) (12/21 0504) Temp Source: Oral (12/21 0504) BP: 117/68 (12/21 0504) Pulse Rate: 89 (12/21 1300)  Labs:  Recent Labs  08/08/16 0227 08/09/16 0303 08/10/16 0238  HGB 12.4  --   --   HCT 38.0  --   --   PLT 182  --   --   LABPROT 20.6* 22.1* 30.8*  INR 1.74 1.91 2.89  CREATININE 0.81  --   --     Estimated Creatinine Clearance: 85 mL/min (by C-G formula based on SCr of 0.81 mg/dL).   Assessment: 70yo female c/o right-sided weakness w/ known h/o neuropathy, found to be in Afib w/ RVR, on Coumadin for PAF.  Anticoag: coumadin pta for afib (CHADS-VASc 4), INR 4.36 on admit. INR 1.91>2.89 today, CBC stable. Nose bleed this AM noted. --Home dose: 5mg  daily except 2.5mg  on Sun/Wed - last taken 12/15  12/17 INR 4.36. No coumadin 12/18 INR 2.3. Coumadin 7.5mg  x 1 12/19 INR 1.74. Coumadin 7.5mg  x 1 12/20 INR 1.91. Coumadin 5mg  x 1 12/21 INR 2.89. Coumadin 2mg  x 1  Goal of Therapy:  INR 2-3 Monitor platelets by anticoagulation protocol: Yes   Plan:  Coumadin 2mg  po x 1 tonight. Daily INR   Ivan Lacher S. Alford Highland, PharmD, BCPS Clinical Staff Pharmacist Pager [phone]  [place], [place] 08/10/2016,1:55 PM

## 2016-08-11 ENCOUNTER — Ambulatory Visit: Payer: Medicare Other | Admitting: Internal Medicine

## 2016-08-11 LAB — COMPREHENSIVE METABOLIC PANEL
ALK PHOS: 54 U/L (ref 38–126)
ALT: 23 U/L (ref 14–54)
AST: 20 U/L (ref 15–41)
Albumin: 2.9 g/dL — ABNORMAL LOW (ref 3.5–5.0)
Anion gap: 8 (ref 5–15)
BILIRUBIN TOTAL: 0.4 mg/dL (ref 0.3–1.2)
BUN: 15 mg/dL (ref 6–20)
CALCIUM: 9.1 mg/dL (ref 8.9–10.3)
CHLORIDE: 99 mmol/L — AB (ref 101–111)
CO2: 31 mmol/L (ref 22–32)
CREATININE: 0.69 mg/dL (ref 0.44–1.00)
Glucose, Bld: 154 mg/dL — ABNORMAL HIGH (ref 65–99)
Potassium: 4.2 mmol/L (ref 3.5–5.1)
Sodium: 138 mmol/L (ref 135–145)
TOTAL PROTEIN: 6.2 g/dL — AB (ref 6.5–8.1)

## 2016-08-11 LAB — CBC
HEMATOCRIT: 40.2 % (ref 36.0–46.0)
Hemoglobin: 13.6 g/dL (ref 12.0–15.0)
MCH: 31 pg (ref 26.0–34.0)
MCHC: 33.8 g/dL (ref 30.0–36.0)
MCV: 91.6 fL (ref 78.0–100.0)
PLATELETS: 218 10*3/uL (ref 150–400)
RBC: 4.39 MIL/uL (ref 3.87–5.11)
RDW: 11.9 % (ref 11.5–15.5)
WBC: 4.9 10*3/uL (ref 4.0–10.5)

## 2016-08-11 LAB — GLUCOSE, CAPILLARY
GLUCOSE-CAPILLARY: 156 mg/dL — AB (ref 65–99)
GLUCOSE-CAPILLARY: 133 mg/dL — AB (ref 65–99)
Glucose-Capillary: 166 mg/dL — ABNORMAL HIGH (ref 65–99)
Glucose-Capillary: 194 mg/dL — ABNORMAL HIGH (ref 65–99)

## 2016-08-11 LAB — PROTIME-INR
INR: 2.82
Prothrombin Time: 30.2 seconds — ABNORMAL HIGH (ref 11.4–15.2)

## 2016-08-11 MED ORDER — VITAMIN D 1000 UNITS PO TABS
2000.0000 [IU] | ORAL_TABLET | Freq: Every day | ORAL | Status: DC
  Administered 2016-08-11 – 2016-08-12 (×2): 2000 [IU] via ORAL
  Filled 2016-08-11 (×2): qty 2

## 2016-08-11 MED ORDER — CALCIUM CITRATE 950 (200 CA) MG PO TABS
400.0000 mg | ORAL_TABLET | Freq: Two times a day (BID) | ORAL | Status: DC
  Administered 2016-08-11 – 2016-08-12 (×2): 400 mg via ORAL
  Filled 2016-08-11 (×2): qty 2

## 2016-08-11 MED ORDER — DOCUSATE SODIUM 100 MG PO CAPS
100.0000 mg | ORAL_CAPSULE | Freq: Every day | ORAL | Status: DC
  Administered 2016-08-11 – 2016-08-12 (×2): 100 mg via ORAL
  Filled 2016-08-11 (×2): qty 1

## 2016-08-11 MED ORDER — WARFARIN SODIUM 3 MG PO TABS
3.0000 mg | ORAL_TABLET | Freq: Once | ORAL | Status: AC
  Administered 2016-08-11: 3 mg via ORAL
  Filled 2016-08-11: qty 1

## 2016-08-11 MED ORDER — ADULT MULTIVITAMIN W/MINERALS CH
1.0000 | ORAL_TABLET | Freq: Every day | ORAL | Status: DC
  Administered 2016-08-11 – 2016-08-12 (×2): 1 via ORAL
  Filled 2016-08-11 (×2): qty 1

## 2016-08-11 MED ORDER — OMEGA-3-ACID ETHYL ESTERS 1 G PO CAPS
1.0000 g | ORAL_CAPSULE | Freq: Every day | ORAL | Status: DC
  Administered 2016-08-11 – 2016-08-12 (×2): 1 g via ORAL
  Filled 2016-08-11 (×2): qty 1

## 2016-08-11 NOTE — Care Management Important Message (Signed)
Important Message  Patient Details  Name: Toni [name] MRN: [id number] Date of Birth: 02/19/1946   Medicare Important Message Given:  Yes    Nathen May 08/11/2016, 10:41 AM

## 2016-08-11 NOTE — Progress Notes (Signed)
Physical Therapy Treatment Patient Details Name: Toni [name] MRN: [id number] DOB: [birth year] Today's Date: 08/11/2016    History of Present Illness 70 y.o.woman with PAF (CHADS-VASc 4, anticoagulated with warfarin), multiple sclerosis (followed by Dr. Brett Fairy), central sleep apnea on BiPAP, HTN, GERD, and hypothyroidism who developed the acute onset of right hand weakness with decreased dexterity w/ transient word finding difficulty.  She denied facial droop or other focal deficits. She has chronic RLE weakness which she attributes to her MS and felt this was unchanged. She was in atrial fibrillation with RVR, HR as high as 145.     PT Comments    Patient seen for mobility and activity progression. Patient very eager to mobilize today. Patient tolerated EOB activity, pre gait activities, and OOB to chair. Reinforced exercise program with patient. Will continue to see and progress as tolerated. Current POC remains appropriate.  Follow Up Recommendations  CIR     Equipment Recommendations  None recommended by PT    Recommendations for Other Services       Precautions / Restrictions Precautions Precautions: Fall Restrictions Weight Bearing Restrictions: No    Mobility  Bed Mobility Overal bed mobility: Needs Assistance Bed Mobility: Supine to Sit     Supine to sit: Min assist     General bed mobility comments: Min assist via UE pull to sit  Transfers Overall transfer level: Needs assistance Equipment used: Rolling walker (2 wheeled) Transfers: Sit to/from Stand Sit to Stand: Min assist;+2 physical assistance Stand pivot transfers: Min assist;+2 physical assistance;Mod assist       General transfer comment: Min assist for stability bilaterally when elevating to standing x3, min assist to stabilize surface and guard for safety during pivot to [facility]. Patient with fwd flexed posture during transition, easily fatigues and had to sit prematurely during pivot to [name].  Patient was able to readjust without increased assist  Ambulation/Gait Ambulation/Gait assistance: Min assist Ambulation Distance (Feet): 3 Feet Assistive device: Rolling walker (2 wheeled) Gait Pattern/deviations: Step-to pattern;Trunk flexed;Wide base of support     General Gait Details: patient extremely limited in muscular endurance impacting ability to ambulated, tolerated 3 steps forward with RW but required chair to sit.   Stairs            Wheelchair Mobility    Modified Rankin (Stroke Patients Only)       Balance Overall balance assessment: Needs assistance Sitting-balance support: Feet supported Sitting balance-Leahy Scale: Fair     Standing balance support: Bilateral upper extremity supported Standing balance-Leahy Scale: Poor Standing balance comment: Needs assist via RW and can not maintain duration in standing                    Cognition Arousal/Alertness: Awake/alert Behavior During Therapy: WFL for tasks assessed/performed Overall Cognitive Status: Within Functional Limits for tasks assessed                      Exercises General Exercises - Lower Extremity Ankle Circles/Pumps: AROM;Both (reinforced importance of ROM) Hip ABduction/ADduction: AROM;Both;5 reps (limited tolerance, performed in recliner)    General Comments        Pertinent Vitals/Pain Pain Assessment: Faces Faces Pain Scale: Hurts even more Pain Location: bilateral legs and abdomen Pain Descriptors / Indicators: Burning;Sore Pain Intervention(s): Monitored during session    Home Living                      Prior Function  PT Goals (current goals can now be found in the care plan section) Acute Rehab PT Goals Patient Stated Goal: rehab and start sterioids PT Goal Formulation: With patient Time For Goal Achievement: 08/20/16 Potential to Achieve Goals: Good Progress towards PT goals: Progressing toward goals    Frequency    Min  3X/week      PT Plan Discharge plan needs to be updated    Co-evaluation             End of Session Equipment Utilized During Treatment: Oxygen Activity Tolerance: Patient tolerated treatment well Patient left: in chair;with call bell/phone within reach (with OT present)     Time: 1022-1039 PT Time Calculation (min) (ACUTE ONLY): 17 min  Charges:  $Therapeutic Activity: 8-22 mins                    G Codes:      Duncan Dull 08-22-2016, 10:50 AM Alben Deeds, PT DPT  [phone]

## 2016-08-11 NOTE — Progress Notes (Signed)
Clinical Social Worker received phone call from [employer] from [place] and she stated they will make an offer on patient. Claiborne Billings stated that unfortunately patient will have to be in a semi-private room. CSW relayed information to patient and patient stated she would prefer a private room but would still take the  Hospital offer. CSW will remain available for support.  Rhea Pink, MSW,  [place]

## 2016-08-11 NOTE — Progress Notes (Signed)
ANTICOAGULATION CONSULT NOTE - Follow Up Consult  Pharmacy Consult for Coumadin Indication: atrial fibrillation  Allergies  Allergen Reactions  . Hydrocodone Nausea And Vomiting    Hydrocodone causes vomiting  . Oxycodone Nausea And Vomiting    Oral oxycodone causes vomiting  . Chlorhexidine Gluconate Rash    Sores at site  . Zoloft [Sertraline] Hives, Swelling and Rash    Patient Measurements: Height: 5\' 3"  (160 cm) Weight: 279 lb 6.4 oz (126.7 kg) IBW/kg (Calculated) : 52.4  Vital Signs: Temp: 98.2 F (36.8 C) (12/22 0357) Temp Source: Oral (12/22 0357) BP: 141/70 (12/22 0357) Pulse Rate: 69 (12/22 0357)  Labs:  Recent Labs  08/09/16 0303 08/10/16 0238 08/11/16 0332  HGB  --   --  13.6  HCT  --   --  40.2  PLT  --   --  218  LABPROT 22.1* 30.8* 30.2*  INR 1.91 2.89 2.82  CREATININE  --   --  0.69    Estimated Creatinine Clearance: 86 mL/min (by C-G formula based on SCr of 0.69 mg/dL).   Assessment: 70yo female c/o right-sided weakness w/ known h/o neuropathy, found to be in Afib w/ RVR, on Coumadin for PAF.  Anticoag: Coumadin PTA for afib (CHADS-VASc 4), INR 4.36 on admit. INR 1.91>2.82 today after holding dose last PM, CBC improved. Home dose: 5mg  daily except 2.5mg  on Sun/Wed - last taken 12/15   12/17 INR 4.36. No coumadin 12/18 INR 2.3. Coumadin 7.5mg  x 1 12/19 INR 1.74. Coumadin 7.5mg  x 1 12/20 INR 1.91. Coumadin 5mg  x 1 12/21 INR 2.89. Coumadin 2mg  x 1 12/22: INR 2.82: Coumadin 3mg  po x 1   Goal of Therapy:  INR 2-3 Monitor platelets by anticoagulation protocol: Yes   Plan:  Coumadin 3mg  po x 1 tonight. Daily INR   Ardys Hataway S. Alford Highland, PharmD, [place] Clinical Staff Pharmacist Pager [phone]  [place], [place] 08/11/2016,10:23 AM

## 2016-08-11 NOTE — Progress Notes (Signed)
Occupational Therapy Treatment Patient Details Name: Toni [name] MRN: [id number] DOB: Nov 09, 1945 Today's Date: 08/11/2016    History of present illness 70 y.o.woman with PAF (CHADS-VASc 4, anticoagulated with warfarin), multiple sclerosis (followed by Dr. Brett Fairy), central sleep apnea on BiPAP, HTN, GERD, and hypothyroidism who developed the acute onset of right hand weakness with decreased dexterity w/ transient word finding difficulty.  She denied facial droop or other focal deficits. She has chronic RLE weakness which she attributes to her MS and felt this was unchanged. She was in atrial fibrillation with RVR, HR as high as 145.    OT comments  Pt required min assist +2 today for stand pivot to [facility]. Pt requires max assist for LB dressing, peri care, and min assist for grooming at chair level. Pt quick to fatigue but motivated to participate in therapy. Noted CIR denial on 12/20; therefore updated d/c plan to SNF for further rehab prior to return home. Will continue to follow acutely.   Follow Up Recommendations  SNF    Equipment Recommendations  None recommended by OT    Recommendations for Other Services      Precautions / Restrictions Precautions Precautions: Fall Restrictions Weight Bearing Restrictions: No       Mobility Bed Mobility Overal bed mobility: Needs Assistance Bed Mobility: Supine to Sit     Supine to sit: Min assist     General bed mobility comments: Min assist via UE pull to sit  Transfers Overall transfer level: Needs assistance Equipment used: Rolling walker (2 wheeled) Transfers: Sit to/from Stand Sit to Stand: Min assist;+2 physical assistance Stand pivot transfers: Min assist;+2 physical assistance;Mod assist       General transfer comment: Min assist for stability bilaterally when elevating to standing x3, min assist to stabilize surface and guard for safety during pivot to [facility]. Patient with fwd flexed posture during transition,  easily fatigues and had to sit prematurely during pivot to [facility]. Patient was able to readjust without increased assist    Balance Overall balance assessment: Needs assistance Sitting-balance support: Feet supported Sitting balance-Leahy Scale: Fair     Standing balance support: Bilateral upper extremity supported Standing balance-Leahy Scale: Poor Standing balance comment: Needs assist via RW and can not maintain duration in standing                   ADL Overall ADL's : Needs assistance/impaired     Grooming: Minimal assistance;Sitting;Wash/dry face;Oral care;Brushing hair               Lower Body Dressing: Maximal assistance Lower Body Dressing Details (indicate cue type and reason): to doff/don socks in sitting Toilet Transfer: Minimal assistance;+2 for physical assistance;Stand-pivot;BSC;RW   Toileting- Clothing Manipulation and Hygiene: Maximal assistance;Sit to/from stand       Functional mobility during ADLs: Minimal assistance;+2 for physical assistance;Rolling walker General ADL Comments: Pt fatigues quickly with functional mobility/transfers. Encouraged OOB activity and continued UE/LE exercises while sitting in chair.      Vision                     Perception     Praxis      Cognition   Behavior During Therapy: [facility] for tasks assessed/performed Overall Cognitive Status: Within Functional Limits for tasks assessed                       Extremity/Trunk Assessment  Exercises    Shoulder Instructions       General Comments      Pertinent Vitals/ Pain       Pain Assessment: Faces Faces Pain Scale: Hurts even more Pain Location: bilateral legs and abdomen Pain Descriptors / Indicators: Burning;Sore Pain Intervention(s): Monitored during session;Repositioned  Home Living                                          Prior Functioning/Environment              Frequency  Min 2X/week         Progress Toward Goals  OT Goals(current goals can now be found in the care plan section)  Progress towards OT goals: Progressing toward goals  Acute Rehab OT Goals Patient Stated Goal: rehab and start sterioids OT Goal Formulation: With patient  Plan Discharge plan needs to be updated    Co-evaluation    PT/OT/SLP Co-Evaluation/Treatment: Yes Reason for Co-Treatment: For patient/therapist safety;To address functional/ADL transfers   OT goals addressed during session: ADL's and self-care      End of Session Equipment Utilized During Treatment: Rolling walker;Oxygen   Activity Tolerance Patient tolerated treatment well   Patient Left in chair;with call bell/phone within reach   Nurse Communication Mobility status;Other (comment) (IV beeping)        Time: [id number] OT Time Calculation (min): 24 min  Charges: OT General Charges $OT Visit: 1 Procedure OT Treatments $Self Care/Home Management : 8-22 mins  Binnie Kand M.S., OTR/L Pager: [phone]  08/11/2016, 10:52 AM

## 2016-08-11 NOTE — Progress Notes (Signed)
PROGRESS NOTE  Toni [name] [id number] DOB: 09/06/1945 DOA: 08/05/2016 PCP: [name], MD   LOS: 4 days   Brief Narrative: 70 y.o.woman with PAF (CHADS-VASc 4, anticoagulated with warfarin), multiple sclerosis (followed by Dr. Brett Fairy), central sleep apnea on BiPAP, HTN, GERD, and hypothyroidism who developed the acute onset of right hand weakness with decreased dexterity w/ transient word finding difficulty.  She denied facial droop or other focal deficits. She has chronic RLE weakness which she attributes to her MS and felt this was unchanged. 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 Allyson Sabal discussed with Dr. Katherine Roan, he recommended IV Solu-Medrol 500 mg a day 3 days, today day 2 out of 3 - CIR has been denied, start looking for SNF vs home health  Atrial fibrillation with RVR - RVR resolved, now rate controlled - Initially given IV cardizem - currently on beta blocker and Cardizem PO and controlled , -PAF (CHADS-VASc 4, anticoagulated with warfarin)  Fever - Chest x-ray shows CAP, started antibiotics 12/18-12/24 - UA negative on admission - BC with coagulase negative staph 1/2 bottles, likely contaminant   Coagulopathy due to Warfarin  - INR now corrected  GERD -  PPI   Hypothyroidism - Continue home dose of levothyroxine  -Recent TSH 1.08, T4 1.34  Anxiety - Xanax prn, Wellbutrin  Epistaxis - Resolved  - Continue ocean nasal spray  DVT prophylaxis: Coumadin Code Status: Full code Family Communication: no family bedside Disposition Plan: SNF 1-2 days  Consultants:   Neurology   Procedures:   2D echo Study Conclusions - Left ventricle: The cavity size was normal. Wall thickness was normal. Systolic function was normal. The estimated ejection fraction was 55%. Wall motion was normal; there were no regional wall motion abnormalities. - Left atrium: The atrium was mildly dilated. - Atrial septum: A patent foramen ovale cannot be excluded. - Pericardium, extracardiac: A trivial pericardial effusion was identified.  Antimicrobials:  Ceftriaxone 12/18 >>  Azithromycin 12/18 >>   Subjective: - Complains of ongoing chest congestion, denies any shortness of breath, denies any chest pains  Objective: Vitals:   08/10/16 1300 08/10/16 1435 08/10/16 2002 08/11/16 0357  BP:  122/82 (!) 147/83 (!) 141/70  Pulse: 89 (!) 114 (!) 125 69  Resp:  18 18 18   Temp:  98.8 F (37.1 C) 98.5 F (36.9 C) 98.2 F (36.8 C)  TempSrc:  Oral Oral Oral  SpO2: 97% 94% 93% 96%  Weight:      Height:        Intake/Output Summary (Last 24 hours) at 08/11/16 1125 Last data filed at 08/11/16 0830  Gross per 24 hour  Intake              294 ml  Output  0 ml  Net              294 ml   Filed Weights   08/06/16 0009 08/06/16 0611  Weight: 117.9 kg (260 lb) 126.7 kg (279 lb 6.4 oz)    Examination: Constitutional: NAD Vitals:   08/10/16 1300 08/10/16 1435 08/10/16 2002 08/11/16 0357  BP:  122/82 (!) 147/83 (!) 141/70  Pulse: 89 (!) 114 (!) 125 69  Resp:  18 18 18   Temp:  98.8 F (37.1 C) 98.5 F (36.9 C) 98.2 F (36.8 C)  TempSrc:  Oral Oral Oral  SpO2: 97% 94% 93% 96%  Weight:      Height:       Eyes: PERRL, lids and  conjunctivae normal Respiratory: slight wheezing, no crackles. Normal respiratory effort. No accessory muscle use.  Cardiovascular: irregular,  Abdomen: no tenderness. Bowel sounds positive.  Musculoskeletal: no clubbing / cyanosis.  Skin: no rashes, lesions, ulcers. No induration Neurologic: 4/5 RUE and RLE, 5/5 on left    Data Reviewed: I have personally reviewed following labs and imaging studies  CBC:  Recent Labs Lab 08/06/16 0125 08/06/16 0129 08/07/16 0142 08/08/16 0227 08/11/16 0332  WBC 9.4  --  7.3 6.5 4.9  NEUTROABS 6.6  --   --   --   --   HGB 13.4 12.9 12.3 12.4 13.6  HCT 40.9 38.0 38.1 38.0 40.2  MCV 95.6  --  94.5 95.0 91.6  PLT 153  --  169 182 [id number]   Basic Metabolic Panel:  Recent Labs Lab 08/06/16 0125 08/06/16 0129 08/07/16 0142 08/08/16 0227 08/11/16 0332  NA 139 138 135 141 138  K 4.7 4.4 3.7 3.7 4.2  CL 100* 100* 93* 93* 99*  CO2 33*  --  32 38* 31  GLUCOSE 110* 110* 75 74 154*  BUN 18 22* 16 18 15   CREATININE 0.84 0.70 0.82 0.81 0.69  CALCIUM 8.9  --  8.3* 8.4* 9.1   GFR: Estimated Creatinine Clearance: 86 mL/min (by C-G formula based on SCr of 0.69 mg/dL). Liver Function Tests:  Recent Labs Lab 08/06/16 0125 08/07/16 0142 08/08/16 0227 08/11/16 0332  AST 19 23 23 20   ALT 21 16 21 23   ALKPHOS 51 43 47 54  BILITOT 0.7 1.4* 0.7 0.4  PROT 6.1* 5.3* 5.6* 6.2*  ALBUMIN 3.1* 2.7* 2.6* 2.9*   No results for input(s): LIPASE, AMYLASE in the last 168 hours. No results for input(s): AMMONIA in the last 168 hours. Coagulation Profile:  Recent Labs Lab 08/07/16 0142 08/08/16 0227 08/09/16 0303 08/10/16 0238 08/11/16 0332  INR 2.30 1.74 1.91 2.89 2.82   Cardiac Enzymes:  Recent Labs Lab 08/06/16 0647 08/06/16 1143 08/06/16 1833  TROPONINI <0.03 <0.03 0.05*   BNP (last 3 results) No results for input(s): PROBNP in the last 8760 hours. HbA1C: No results for input(s): HGBA1C in the last 72 hours. CBG:  Recent Labs Lab  08/10/16 2226 08/11/16 0618  GLUCAP 158* 156*   Lipid Profile: No results for input(s): CHOL, HDL, LDLCALC, TRIG, CHOLHDL, LDLDIRECT in the last 72 hours. Thyroid Function Tests: No results for input(s): TSH, T4TOTAL, FREET4, T3FREE, THYROIDAB in the last 72 hours. Anemia Panel: No results for input(s): VITAMINB12, FOLATE, FERRITIN, TIBC, IRON, RETICCTPCT in the last 72 hours. Urine analysis:    Component Value Date/Time   COLORURINE YELLOW 08/06/2016 0243   APPEARANCEUR CLEAR 08/06/2016 0243   APPEARANCEUR Clear 12/18/2014 1156   LABSPEC 1.028 08/06/2016 0243   PHURINE 5.0  08/06/2016 0243   GLUCOSEU NEGATIVE 08/06/2016 0243   HGBUR NEGATIVE 08/06/2016 0243   BILIRUBINUR NEGATIVE 08/06/2016 0243   BILIRUBINUR Negative 12/18/2014 1156   KETONESUR NEGATIVE 08/06/2016 0243   PROTEINUR NEGATIVE 08/06/2016 0243   UROBILINOGEN 0.2 08/05/2014 0951   NITRITE NEGATIVE 08/06/2016 0243   LEUKOCYTESUR NEGATIVE 08/06/2016 0243   LEUKOCYTESUR 1+ (A) 12/18/2014 1156   Sepsis Labs: Invalid input(s): PROCALCITONIN, LACTICIDVEN  Recent Results (from the past 240 hour(s))  MRSA PCR Screening     Status: None   Collection Time: 08/06/16  6:40 AM  Result Value Ref Range Status   MRSA by PCR NEGATIVE NEGATIVE Final    Comment:        The GeneXpert MRSA Assay (FDA approved for NASAL specimens only), is one component of a comprehensive MRSA colonization surveillance program. It is not intended to diagnose MRSA infection nor to guide or monitor treatment for MRSA infections.   Culture, blood (Routine X 2) w Reflex to ID Panel     Status: None (Preliminary result)   Collection Time: 08/07/16 12:16 PM  Result Value Ref Range Status   Specimen Description BLOOD BLOOD LEFT ARM  Final   Special Requests BOTTLES DRAWN AEROBIC AND ANAEROBIC 5CC  Final   Culture NO GROWTH 3 DAYS  Final   Report Status PENDING  Incomplete  Culture, blood (Routine X 2) w Reflex to ID Panel     Status: Abnormal     Collection Time: 08/07/16 12:17 PM  Result Value Ref Range Status   Specimen Description BLOOD RIGHT ANTECUBITAL  Final   Special Requests BOTTLES DRAWN AEROBIC AND ANAEROBIC 5CC  Final   Culture  Setup Time   Final    GRAM POSITIVE COCCI IN CLUSTERS IN BOTH AEROBIC AND ANAEROBIC BOTTLES Organism ID to follow CRITICAL RESULT CALLED TO, READ BACK BY AND VERIFIED WITH: N. Batchelder Pharm.D. 17:15 08/08/16  ([email or web address])    Culture (A)  Final    STAPHYLOCOCCUS SPECIES (COAGULASE NEGATIVE) THE SIGNIFICANCE OF ISOLATING THIS ORGANISM FROM A SINGLE SET OF BLOOD CULTURES WHEN MULTIPLE SETS ARE DRAWN IS UNCERTAIN. PLEASE NOTIFY THE MICROBIOLOGY DEPARTMENT WITHIN ONE WEEK IF SPECIATION AND SENSITIVITIES ARE REQUIRED.    Report Status 08/10/2016 FINAL  Final  Blood Culture ID Panel (Reflexed)     Status: Abnormal   Collection Time: 08/07/16 12:17 PM  Result Value Ref Range Status   Enterococcus species NOT DETECTED NOT DETECTED Final   Listeria monocytogenes NOT DETECTED NOT DETECTED Final   Staphylococcus species DETECTED (A) NOT DETECTED Final    Comment: CRITICAL RESULT CALLED TO, READ BACK BY AND VERIFIED WITH: N. Batchelder Pharm.D. 17:15 08/08/16 ([email or web address])    Staphylococcus aureus NOT DETECTED NOT DETECTED Final   Methicillin resistance NOT DETECTED NOT DETECTED Final   Streptococcus species NOT DETECTED NOT DETECTED Final   Streptococcus agalactiae NOT DETECTED NOT DETECTED Final   Streptococcus pneumoniae NOT DETECTED NOT DETECTED Final   Streptococcus pyogenes NOT DETECTED NOT DETECTED Final   Acinetobacter baumannii NOT DETECTED NOT DETECTED Final   Enterobacteriaceae species NOT DETECTED NOT DETECTED Final   Enterobacter cloacae complex NOT DETECTED NOT DETECTED Final   Escherichia coli NOT DETECTED NOT DETECTED Final   Klebsiella oxytoca NOT DETECTED NOT DETECTED Final   Klebsiella pneumoniae NOT DETECTED NOT DETECTED Final   Proteus species NOT DETECTED NOT DETECTED Final    Serratia marcescens NOT DETECTED NOT DETECTED Final   Haemophilus influenzae NOT DETECTED NOT DETECTED Final   Neisseria meningitidis NOT DETECTED  NOT DETECTED Final   Pseudomonas aeruginosa NOT DETECTED NOT DETECTED Final   Candida albicans NOT DETECTED NOT DETECTED Final   Candida glabrata NOT DETECTED NOT DETECTED Final   Candida krusei NOT DETECTED NOT DETECTED Final   Candida parapsilosis NOT DETECTED NOT DETECTED Final   Candida tropicalis NOT DETECTED NOT DETECTED Final      Radiology Studies: No results found.   Scheduled Meds: . azithromycin  500 mg Oral Daily  . buPROPion  300 mg Oral Daily  . cefTRIAXone (ROCEPHIN)  IV  1 g Intravenous Q24H  . cetirizine  10 mg Oral Daily  . diltiazem  120 mg Oral Daily  . famotidine  20 mg Oral QHS  . gabapentin  300 mg Oral TID  . insulin aspart  0-15 Units Subcutaneous TID WC  . insulin aspart  0-5 Units Subcutaneous QHS  . lactulose  10 g Oral BID  . levothyroxine  75 mcg Oral QAC breakfast  . methylPREDNISolone (SOLU-MEDROL) injection  500 mg Intravenous Daily  . nebivolol  20 mg Oral QHS  . pantoprazole  40 mg Oral Daily  . simvastatin  10 mg Oral QHS  . sodium chloride  500 mL Intravenous Once  . tamsulosin  0.4 mg Oral Daily  . warfarin  3 mg Oral ONCE-1800  . Warfarin - Pharmacist Dosing Inpatient   Does not apply q1800   Continuous Infusions:  Marzetta Board, MD, PhD [facility] Pager [phone]  If 7PM-7AM, please contact night-coverage [email or web address] Password [facility] 08/11/2016, 11:25 AM

## 2016-08-12 DIAGNOSIS — E038 Other specified hypothyroidism: Secondary | ICD-10-CM | POA: Diagnosis not present

## 2016-08-12 DIAGNOSIS — I10 Essential (primary) hypertension: Secondary | ICD-10-CM | POA: Diagnosis not present

## 2016-08-12 DIAGNOSIS — E669 Obesity, unspecified: Secondary | ICD-10-CM | POA: Diagnosis not present

## 2016-08-12 DIAGNOSIS — T7840XD Allergy, unspecified, subsequent encounter: Secondary | ICD-10-CM | POA: Diagnosis not present

## 2016-08-12 DIAGNOSIS — J189 Pneumonia, unspecified organism: Secondary | ICD-10-CM | POA: Diagnosis not present

## 2016-08-12 DIAGNOSIS — E039 Hypothyroidism, unspecified: Secondary | ICD-10-CM | POA: Diagnosis not present

## 2016-08-12 DIAGNOSIS — K219 Gastro-esophageal reflux disease without esophagitis: Secondary | ICD-10-CM | POA: Diagnosis not present

## 2016-08-12 DIAGNOSIS — F419 Anxiety disorder, unspecified: Secondary | ICD-10-CM | POA: Diagnosis not present

## 2016-08-12 DIAGNOSIS — I872 Venous insufficiency (chronic) (peripheral): Secondary | ICD-10-CM | POA: Diagnosis not present

## 2016-08-12 DIAGNOSIS — K5900 Constipation, unspecified: Secondary | ICD-10-CM | POA: Diagnosis not present

## 2016-08-12 DIAGNOSIS — R21 Rash and other nonspecific skin eruption: Secondary | ICD-10-CM | POA: Diagnosis not present

## 2016-08-12 DIAGNOSIS — E569 Vitamin deficiency, unspecified: Secondary | ICD-10-CM | POA: Diagnosis not present

## 2016-08-12 DIAGNOSIS — F329 Major depressive disorder, single episode, unspecified: Secondary | ICD-10-CM | POA: Diagnosis not present

## 2016-08-12 DIAGNOSIS — R5081 Fever presenting with conditions classified elsewhere: Secondary | ICD-10-CM | POA: Diagnosis not present

## 2016-08-12 DIAGNOSIS — G35 Multiple sclerosis: Secondary | ICD-10-CM | POA: Diagnosis not present

## 2016-08-12 DIAGNOSIS — J3089 Other allergic rhinitis: Secondary | ICD-10-CM | POA: Diagnosis not present

## 2016-08-12 DIAGNOSIS — M6281 Muscle weakness (generalized): Secondary | ICD-10-CM | POA: Diagnosis not present

## 2016-08-12 DIAGNOSIS — E785 Hyperlipidemia, unspecified: Secondary | ICD-10-CM | POA: Diagnosis not present

## 2016-08-12 DIAGNOSIS — I4891 Unspecified atrial fibrillation: Secondary | ICD-10-CM | POA: Diagnosis not present

## 2016-08-12 DIAGNOSIS — G8929 Other chronic pain: Secondary | ICD-10-CM | POA: Diagnosis not present

## 2016-08-12 DIAGNOSIS — R29898 Other symptoms and signs involving the musculoskeletal system: Secondary | ICD-10-CM | POA: Diagnosis not present

## 2016-08-12 DIAGNOSIS — I509 Heart failure, unspecified: Secondary | ICD-10-CM | POA: Diagnosis not present

## 2016-08-12 DIAGNOSIS — I480 Paroxysmal atrial fibrillation: Secondary | ICD-10-CM | POA: Diagnosis not present

## 2016-08-12 DIAGNOSIS — N319 Neuromuscular dysfunction of bladder, unspecified: Secondary | ICD-10-CM | POA: Diagnosis not present

## 2016-08-12 DIAGNOSIS — R252 Cramp and spasm: Secondary | ICD-10-CM | POA: Diagnosis not present

## 2016-08-12 DIAGNOSIS — R531 Weakness: Secondary | ICD-10-CM | POA: Diagnosis not present

## 2016-08-12 DIAGNOSIS — R062 Wheezing: Secondary | ICD-10-CM | POA: Diagnosis not present

## 2016-08-12 DIAGNOSIS — G4731 Primary central sleep apnea: Secondary | ICD-10-CM | POA: Diagnosis not present

## 2016-08-12 DIAGNOSIS — G4737 Central sleep apnea in conditions classified elsewhere: Secondary | ICD-10-CM | POA: Diagnosis not present

## 2016-08-12 DIAGNOSIS — E119 Type 2 diabetes mellitus without complications: Secondary | ICD-10-CM | POA: Diagnosis not present

## 2016-08-12 DIAGNOSIS — Z7901 Long term (current) use of anticoagulants: Secondary | ICD-10-CM | POA: Diagnosis not present

## 2016-08-12 DIAGNOSIS — R0600 Dyspnea, unspecified: Secondary | ICD-10-CM | POA: Diagnosis not present

## 2016-08-12 DIAGNOSIS — R609 Edema, unspecified: Secondary | ICD-10-CM | POA: Diagnosis not present

## 2016-08-12 LAB — BASIC METABOLIC PANEL
Anion gap: 8 (ref 5–15)
BUN: 20 mg/dL (ref 6–20)
CALCIUM: 9.2 mg/dL (ref 8.9–10.3)
CO2: 31 mmol/L (ref 22–32)
CREATININE: 0.6 mg/dL (ref 0.44–1.00)
Chloride: 99 mmol/L — ABNORMAL LOW (ref 101–111)
GFR calc non Af Amer: >60 mL/min (ref >60)
Glucose, Bld: 147 mg/dL — ABNORMAL HIGH (ref 65–99)
Potassium: 3.9 mmol/L (ref 3.5–5.1)
SODIUM: 138 mmol/L (ref 135–145)

## 2016-08-12 LAB — CBC
HCT: 39.4 % (ref 36.0–46.0)
Hemoglobin: 13.2 g/dL (ref 12.0–15.0)
MCH: 30.6 pg (ref 26.0–34.0)
MCHC: 33.5 g/dL (ref 30.0–36.0)
MCV: 91.4 fL (ref 78.0–100.0)
PLATELETS: 219 10*3/uL (ref 150–400)
RBC: 4.31 MIL/uL (ref 3.87–5.11)
RDW: 12.1 % (ref 11.5–15.5)
WBC: 7.5 10*3/uL (ref 4.0–10.5)

## 2016-08-12 LAB — CULTURE, BLOOD (ROUTINE X 2): CULTURE: NO GROWTH

## 2016-08-12 LAB — GLUCOSE, CAPILLARY
GLUCOSE-CAPILLARY: 132 mg/dL — AB (ref 65–99)
GLUCOSE-CAPILLARY: 129 mg/dL — AB (ref 65–99)

## 2016-08-12 LAB — PROTIME-INR
INR: 3.35
PROTHROMBIN TIME: 34.7 s — AB (ref 11.4–15.2)

## 2016-08-12 MED ORDER — LEVALBUTEROL HCL 0.63 MG/3ML IN NEBU: 0.6300 mg | INHALATION_SOLUTION | Freq: Four times a day (QID) | RESPIRATORY_TRACT | 12 refills | Status: DC | PRN

## 2016-08-12 MED ORDER — AZITHROMYCIN 500 MG PO TABS: 500.0000 mg | ORAL_TABLET | Freq: Every day | ORAL | 0 refills | Status: DC

## 2016-08-12 MED ORDER — ALPRAZOLAM 0.5 MG PO TABS: 0.5000 mg | ORAL_TABLET | Freq: Two times a day (BID) | ORAL | 0 refills | Status: DC | PRN

## 2016-08-12 MED ORDER — CEFUROXIME AXETIL 500 MG PO TABS
500.0000 mg | ORAL_TABLET | Freq: Two times a day (BID) | ORAL | Status: DC
  Administered 2016-08-12: 500 mg via ORAL
  Filled 2016-08-12: qty 1

## 2016-08-12 MED ORDER — CEFUROXIME AXETIL 500 MG PO TABS: 500.0000 mg | ORAL_TABLET | Freq: Two times a day (BID) | ORAL | Status: DC

## 2016-08-12 MED ORDER — INSULIN ASPART 100 UNIT/ML ~~LOC~~ SOLN: 0.0000 [IU] | Freq: Three times a day (TID) | SUBCUTANEOUS | 11 refills | Status: DC

## 2016-08-12 MED ORDER — LACTULOSE 10 GM/15ML PO SOLN: 10.0000 g | Freq: Two times a day (BID) | ORAL | 0 refills | Status: DC

## 2016-08-12 MED ORDER — DOCUSATE SODIUM 100 MG PO CAPS: 100.0000 mg | ORAL_CAPSULE | Freq: Every day | ORAL | 0 refills | Status: DC

## 2016-08-12 NOTE — Progress Notes (Signed)
Pt to discharge to [facility], transport [facility]. R arm IV removed, telemetry removed, CCMD notified. Went over medications with patient. No questions at this time. Report called to [facility].  Cyndia Bent RN

## 2016-08-12 NOTE — Discharge Planning (Signed)
CSW notified pt is ready for DC. CSW contacted facility [facility], ok for pt to come today. [facility] transport set up for pt. Family updated. Nurse updated. DC Summary/Transfr report faxed to facility. All transportation docs placed on pt's chart. Pt has no other needs at this time.  CSW is signing off.  Paighton Godette B. Joline Maxcy Clinical Social Work Dept Weekend Social Worker [phone] 5:25 PM

## 2016-08-12 NOTE — Discharge Summary (Signed)
Physician Discharge Summary   Patient ID: Toni [name] MRN: [id number] DOB/AGE: 03/16/1946 70 y.o.  Admit date: 08/05/2016 Discharge date: 08/12/2016  Primary Care Physician:  [name], MD  Discharge Diagnoses:      Right hand weakness secondary to MS flare . Atrial fibrillation with rapid ventricular response ([place]) . Multiple sclerosis, 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 . Hypertension . Thyroid disease . Morbid obesity ([place]) . Anxiety   Consults: Neurology  Recommendations for Outpatient Follow-up:  1. Continue physical therapy, fall precautions 2. Please repeat CBC/BMET at next visit 3. Continue Zithromax, Ceftin for 2 more days 4. BiPAP at bedtime per home regimen 5. Please check CBGs before mealtimes and at bedtime. continue insulin, blood sugars were elevated secondary to steroids. Patient may not need insulin after steroid effect is over. 6. Follow PT/INR, patient on Coumadin for atrial fibrillation   DIET: Heart healthy diet    Allergies:   Allergies  Allergen Reactions  . Hydrocodone Nausea And Vomiting    Hydrocodone causes vomiting  . Oxycodone Nausea And Vomiting    Oral oxycodone causes vomiting  . Chlorhexidine Gluconate Rash    Sores at site  . Zoloft [Sertraline] Hives, Swelling and Rash     DISCHARGE MEDICATIONS: Current Discharge Medication List    START taking these medications   Details  azithromycin (ZITHROMAX) 500 MG tablet Take 1 tablet (500 mg total) by mouth daily. X 2 MORE DAYS Refills: 0    cefUROXime (CEFTIN) 500 MG tablet Take 1 tablet (500 mg total) by mouth 2 (two) times daily with a meal. X 2 MORE DAYS    docusate sodium (COLACE) 100 MG capsule Take 1 capsule (100 mg total) by mouth daily. Qty: 10 capsule, Refills: 0    insulin aspart (NOVOLOG) 100 UNIT/ML injection Inject 0-15 Units into the skin 3 (three) times daily with meals. Sliding scale  CBG 70 - 120: 0 units: CBG 121 - 150: 2 units; CBG  151 - 200: 3 units; CBG 201 - 250: 5 units; CBG 251 - 300: 8 units;CBG 301 - 350: 11 units; CBG 351 - 400: 15 units; CBG > 400 : 15 units and notify MD Qty: 10 mL, Refills: 11    lactulose (CHRONULAC) 10 GM/15ML solution Take 15 mLs (10 g total) by mouth 2 (two) times daily. Qty: 240 mL, Refills: 0    levalbuterol (XOPENEX) 0.63 MG/3ML nebulizer solution Take 3 mLs (0.63 mg total) by nebulization every 6 (six) hours as needed for wheezing or shortness of breath. Qty: 3 mL, Refills: 12      CONTINUE these medications which have CHANGED   Details  ALPRAZolam (XANAX) 0.5 MG tablet Take 1 tablet (0.5 mg total) by mouth 2 (two) times daily as needed for anxiety. Qty: 20 tablet, Refills: 0      CONTINUE these medications which have NOT CHANGED   Details  acetaminophen (TYLENOL) 500 MG tablet Take 500-1,000 mg by mouth every 6 (six) hours as needed for fever or headache (or pain).    baclofen (LIORESAL) 20 MG tablet Take 1 tablet (20 mg total) by mouth 3 (three) times daily as needed for muscle spasms. Qty: 180 each, Refills: 3    buPROPion (WELLBUTRIN XL) 300 MG 24 hr tablet Take 300 mg by mouth daily.    CALCIUM CITRATE PO Take 2 tablets by mouth 2 (two) times daily.    cetirizine (ZYRTEC) 10 MG tablet Take 10 mg by mouth daily.    Cholecalciferol (VITAMIN D)  2000 units tablet Take 2,000 Units by mouth 2 (two) times daily.    co-enzyme Q-10 30 MG capsule Take 30 mg by mouth daily.    corticotropin (ATHACAR H.P.) 80 UNIT/ML injectable gel Inject 1 mL (80 Units total) into the skin every other day. To achieve control of relapsing MS Symptoms. (total of 5 injections per month) Qty: 5 mL, Refills: 6   Associated Diagnoses: MS (multiple sclerosis) ([place]); 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)    diltiazem (CARDIZEM CD) 120 MG 24 hr capsule Take 1 capsule (120 mg total) by mouth daily. Qty: 90 capsule, Refills: 3    fluticasone (FLONASE) 50 MCG/ACT nasal  spray Place 2 sprays into both nostrils daily as needed for allergies.     furosemide (LASIX) 40 MG tablet Take 1.5 tablets (60 mg total) by mouth daily. Take 60 mg by mouth daily Qty: 135 tablet, Refills: 3   Associated Diagnoses: Venous insufficiency; Localized edema    gabapentin (NEURONTIN) 300 MG capsule Take 1 capsule by mouth up to four times daily. Qty: 360 capsule, Refills: 3   Associated Diagnoses: Hereditary and idiopathic peripheral neuropathy    GENERLAC 10 GM/15ML SOLN TAKE 7.5 ML BY MOUTH TWICE DAILY Qty: 900 mL, Refills: 5    levothyroxine (SYNTHROID, LEVOTHROID) 75 MCG tablet Take 75 mcg by mouth daily.    Multiple Vitamins-Minerals (ONE-A-DAY WOMENS 50+ ADVANTAGE) TABS Take 1 tablet by mouth daily.    Nebivolol HCl (BYSTOLIC) 20 MG TABS Take 20 mg by mouth daily.    Omega-3 Fatty Acids (FISH OIL) 1000 MG CAPS Take 1,000 mg by mouth daily.    omeprazole (PRILOSEC) 20 MG capsule Take 20 mg by mouth daily.     psyllium (METAMUCIL SMOOTH TEXTURE) 28 % packet Take 1 packet by mouth every other day.    ranitidine (ZANTAC) 150 MG tablet Take 150 mg by mouth at bedtime.     simvastatin (ZOCOR) 20 MG tablet Take 10 mg by mouth at bedtime.     SYRINGE/NEEDLE, DISP, 1 ML (BD LUER-LOK SYRINGE) 25G X 5/8" 1 ML MISC Use as directed to inject  Acthar Qty: 10 each, Refills: prn    Tamsulosin HCl (FLOMAX) 0.4 MG CAPS Take 0.4 mg by mouth daily.    UNABLE TO FIND Baclofen pump: Continuous    warfarin (COUMADIN) 5 MG tablet Take 1 tablet by mouth daily or as directed by coumadin clinic. Qty: 90 tablet, Refills: 2      STOP taking these medications     traMADol (ULTRAM) 50 MG tablet          Brief H and P: For complete details please refer to admission H and P, but in brief 70 y.o.woman with PAF (CHADS-VASc 4, anticoagulated with warfarin), multiple sclerosis (followed by Dr. Brett Fairy), central sleep apnea on BiPAP, HTN, GERD, and hypothyroidism who developed the acute  onset of right hand weakness with decreased dexterity w/ transient word finding difficulty. She denied facial droop or other focal deficits. She has chronic RLE weakness which she attributes to her MS and felt this was unchanged. Found to have community-acquired pneumonia and MS flare.  Hospital Course:   Right hand weakness -MS flair v/s TIA/CVA, cervical radiculopathy ruled out by neurology - MRI brain and C-spine w/wo contrast Was done which showed multiple stresses with stable intracranial clot burden and brain volume compared to 2011, no evidence of active demyelination. 2 probable cord lesions on the left at C3-4 and on the  right at C6-7, chronic given the degree of artifact on 2011 comparison. No cord enhancement typical of acute demyelination. - Neurology has signed off - Patient was concerned that this is an MS flare requesting steroids, Dr. Allyson Sabal discussed with neurologist Dr. Leonel Ramsay, he recommended IV Solu-Medrol 500 mg a day 3 days, patient will complete third day today prior to discharge. - CIR has been denied, patient had accepted skilled nursing facility.  Atrial fibrillation with RVR - RVR resolved, now rate controlled - Initially given IV cardizem - currently on beta blocker and Cardizem PO and controlled , -PAF (CHADS-VASc 4, anticoagulated with warfarin)  Fever - Chest x-ray shows CAP, started antibiotics 12/18-12/24 - UA negative on admission - BC with coagulase negative staph 1/2 bottles, likely contaminant   Coagulopathy due to Warfarin  - INR now corrected, INR 3.3 at the time of discharge.  GERD - PPI   Hypothyroidism - Continue home dose of levothyroxine  -Recent TSH 1.08, T4 1.34  Anxiety - Xanax prn, Wellbutrin  Epistaxis - Resolved  - Continue ocean nasal spray  Day of Discharge BP 136/77 (BP Location: Left Arm)   Pulse 81   Temp 98.1 F (36.7 C) (Oral)   Resp 18   Ht 5\' 3"  (1.6 m)   Wt 126.7 kg (279 lb 6.4 oz)   SpO2 97%    BMI 49.49 kg/m   Physical Exam: General: Alert and awake oriented x3 not in any acute distress. HEENT: anicteric sclera, pupils reactive to light and accommodation CVS: S1-S2 clear no murmur rubs or gallops Chest: clear to auscultation bilaterally, no wheezing rales or rhonchi Abdomen: Obese, soft nontender, nondistended, normal bowel sounds Extremities: no cyanosis, clubbing or edema noted bilaterally Neuro: Cranial nerves II-XII intact, no focal neurological deficits   The results of significant diagnostics from this hospitalization (including imaging, microbiology, ancillary and laboratory) are listed below for reference.    LAB RESULTS: Basic Metabolic Panel:  Recent Labs Lab 08/11/16 0332 08/12/16 0400  NA 138 138  K 4.2 3.9  CL 99* 99*  CO2 31 31  GLUCOSE 154* 147*  BUN 15 20  CREATININE 0.69 0.60  CALCIUM 9.1 9.2   Liver Function Tests:  Recent Labs Lab 08/08/16 0227 08/11/16 0332  AST 23 20  ALT 21 23  ALKPHOS 47 54  BILITOT 0.7 0.4  PROT 5.6* 6.2*  ALBUMIN 2.6* 2.9*   No results for input(s): LIPASE, AMYLASE in the last 168 hours. No results for input(s): AMMONIA in the last 168 hours. CBC:  Recent Labs Lab 08/06/16 0125  08/11/16 0332 08/12/16 0400  WBC 9.4  < > 4.9 7.5  NEUTROABS 6.6  --   --   --   HGB 13.4  < > 13.6 13.2  HCT 40.9  < > 40.2 39.4  MCV 95.6  < > 91.6 91.4  PLT 153  < > 218 219  < > = values in this interval not displayed. Cardiac Enzymes:  Recent Labs Lab 08/06/16 1143 08/06/16 1833  TROPONINI <0.03 0.05*   BNP: Invalid input(s): POCBNP CBG:  Recent Labs Lab 08/11/16 2114 08/12/16 0621  GLUCAP 194* 132*    Significant Diagnostic Studies:  Ct Head Wo Contrast  Result Date: 08/06/2016 CLINICAL DATA:  RIGHT arm weakness. History of multiple sclerosis and hypertension, cervical spine surgery. EXAM: CT HEAD WITHOUT CONTRAST TECHNIQUE: Contiguous axial images were obtained from the base of the skull through the  vertex without intravenous contrast. COMPARISON:  CT HEAD April 21, 2016  FINDINGS: BRAIN: The ventricles and sulci are normal for age. Similar tiny fat density within the frontal horns. No intraparenchymal hemorrhage, mass effect nor midline shift. Patchy supratentorial white matter hypodensities within normal range for patient's age, though non-specific are most compatible with chronic small vessel ischemic disease, similar to progressed. No acute large vascular territory infarcts. No abnormal extra-axial fluid collections. Basal cisterns are patent. VASCULAR: Mild calcific atherosclerosis of the carotid siphons. SKULL: No skull fracture. No significant scalp soft tissue swelling. SINUSES/ORBITS: Small paranasal sinus mucosal retention cyst. The mastoid air-cells and included paranasal sinuses are otherwise well-aerated.Status post bilateral ocular lens implants. The included ocular globes and orbital contents are non-suspicious. OTHER: None. IMPRESSION: No acute intracranial process. Moderate white matter changes can be seen with chronic small vessel ischemic disease or, patient's reported demyelination. Electronically Signed   By: Elon Alas M.D.   On: 08/06/2016 02:43    2D ECHO: Study Conclusions  - Left ventricle: The cavity size was normal. Wall thickness was   normal. Systolic function was normal. The estimated ejection   fraction was 55%. Wall motion was normal; there were no regional   wall motion abnormalities. - Left atrium: The atrium was mildly dilated. - Atrial septum: A patent foramen ovale cannot be excluded. - Pericardium, extracardiac: A trivial pericardial effusion was   identified.  Disposition and Follow-up: Discharge Instructions    Diet - low sodium heart healthy    Complete by:  As directed    Increase activity slowly    Complete by:  As directed        DISPOSITION: SNF   [facility]  Follow up.   Why:  HHPT/OT/aide arranged Contact information: [address] [place] [address] [phone]        [name], MD. Schedule an appointment as soon as possible for a visit in 2 week(s).   Specialty:  Internal Medicine Contact information: [address] [place] [address] [phone]            Time spent on Discharge: 35 mins   Signed:   RAI,RIPUDEEP M.D. [facility] 08/12/2016, 11:05 AM Pager: [phone]

## 2016-08-16 ENCOUNTER — Telehealth: Payer: Self-pay | Admitting: Neurology

## 2016-08-16 ENCOUNTER — Inpatient Hospital Stay: Admission: RE | Admit: 2016-08-16 | Payer: Medicare Other | Source: Ambulatory Visit

## 2016-08-16 NOTE — Telephone Encounter (Signed)
Hi Diana, I will be back  at [facility] on 28 Dec.  I spoke to the patient and her husband, she has an ambulance transport and   Mrs Mcmanamy will see Dr [name] at 11.30 tomorrow - I would like to see her over lunch, possibly giving a high dose steroid in the office.  Can we make Otila Kluver aware of the possible infusion?  Merry Christmas and I hope you didn't get an avalanche of calls for me!   Arta Bruce.

## 2016-08-16 NOTE — Telephone Encounter (Signed)
Pt called said she was admitted to [facility] on 08/05/16 and released for rehab @ [facility] 08/12/16 for acute MS attack and pneumonia. Pt said she had MRI and it showed she had new lesion on her spine. She said Dr Crissie Sickles is wanting her to be seen sooner than 09/11/16 with Dr Brett Fairy. Pt said she has appt with Dr [name] tomorrow to refill baclofen pump.  Please call her at [phone] if she does not answer pls call her husband at [phone]

## 2016-08-16 NOTE — Telephone Encounter (Signed)
Ok Toni [name] is aware.

## 2016-08-17 ENCOUNTER — Ambulatory Visit (INDEPENDENT_AMBULATORY_CARE_PROVIDER_SITE_OTHER): Payer: No Typology Code available for payment source | Admitting: Neurology

## 2016-08-17 ENCOUNTER — Telehealth: Payer: Self-pay | Admitting: Neurology

## 2016-08-17 ENCOUNTER — Encounter: Payer: Self-pay | Admitting: Neurology

## 2016-08-17 VITALS — BP 134/76 | HR 60 | Ht 63.0 in | Wt 279.0 lb

## 2016-08-17 DIAGNOSIS — G35 Multiple sclerosis: Secondary | ICD-10-CM

## 2016-08-17 MED ORDER — BACLOFEN 40 MG/20ML IT SOLN
80.0000 mg | Freq: Once | INTRATHECAL | Status: AC
  Administered 2016-08-17: 80 mg via INTRATHECAL

## 2016-08-17 MED ORDER — INTERFERON BETA-1B 0.3 MG ~~LOC~~ KIT: PACK | SUBCUTANEOUS | 12 refills | Status: DC

## 2016-08-17 MED ORDER — BACLOFEN 40 MG/20ML IT SOLN: 40.0000 mg | Freq: Once | INTRATHECAL | Status: DC

## 2016-08-17 NOTE — Telephone Encounter (Signed)
I was able to review the cervical spine MRI that the patient had just performed during her hospitalization for presumed urinary tract infection and pneumonia. She has been followed at [facility] were Dr. Windle Guard is now the contract physician. He felt that her diagnosis was not likely pneumonia. During the hospitalization she was treated by Dr. Katherine Roan with antibiotics which were continued in oral form during the rehabilitation stay. She is still currently at [facility]. She also received 3 days of IV steroids during her hospitalization. These were given after the cervical spine MRI revealed possible to lesions in the left and right cervical spinal cord.   There is a possibility that she has further lesions down the cord.  Given that she has been treated with Acthar, she was not immune modulated for the last 24 months. Until she was on Toprol corticoid she was treated with Betaseron and prior to that she was 4 years on the benefits, she has been a Tysabri patient in the past as well. Given her possible new relapse I do consider changing her from Acthar back to a immune modulation.  I would definitely consider placing the patient back on Betaseron. She is agreeable to the step. I will order Betaseron.

## 2016-08-17 NOTE — Procedures (Signed)
     History:  Toni [name] is a 70 year old patient with a history of multiple sclerosis. The patient has recently been in the hospital with an exacerbation of multiple sclerosis. She denies any new spasticity in the lower extremities, she is coming in for a routine baclofen pump refill.  Baclofen pump refill note  The baclofen pump site was cleaned with Betadine solution. A 21-gauge needle was inserted into the pump port site. Approximately 6.0 cc of residual baclofen was removed, the pump indicates that he has a 3.2 mL residual. 40 cc of replacement baclofen was placed into the pump at 2000 mcg/cc concentration.  The pump was reprogrammed for the following settings: The pump was maintained at a basal rate of 28.0 g per hour. A total daily dose of 701.9 g per day is given. From the time of 1 AM to 5 AM, the patient gets 139.9 g, rate of 28.0 mcg/h. From 3 PM until 5 AM the rate goes up to 30.0 mcg/h and from 8 PM until 4 AM the rate is 33 g per hour.  The alarm volume is set at 1.5 cc. The next alarm date is 12/04/2016.  The patient tolerated the procedure well. There were no complications of the above procedure.  The [place] number is [id number] The baclofen expiration date is November 2019. The baclofen lot number is [id number].

## 2016-08-24 DIAGNOSIS — R609 Edema, unspecified: Secondary | ICD-10-CM | POA: Diagnosis not present

## 2016-08-25 DIAGNOSIS — R21 Rash and other nonspecific skin eruption: Secondary | ICD-10-CM | POA: Diagnosis not present

## 2016-09-05 NOTE — Telephone Encounter (Signed)
Error

## 2016-09-11 ENCOUNTER — Other Ambulatory Visit: Payer: Self-pay

## 2016-09-11 ENCOUNTER — Ambulatory Visit (INDEPENDENT_AMBULATORY_CARE_PROVIDER_SITE_OTHER): Payer: Medicare Other | Admitting: Neurology

## 2016-09-11 ENCOUNTER — Encounter: Payer: Self-pay | Admitting: Neurology

## 2016-09-11 VITALS — BP 110/61 | HR 77 | Resp 20

## 2016-09-11 DIAGNOSIS — G4731 Primary central sleep apnea: Secondary | ICD-10-CM | POA: Diagnosis not present

## 2016-09-11 DIAGNOSIS — I10 Essential (primary) hypertension: Secondary | ICD-10-CM | POA: Diagnosis not present

## 2016-09-11 DIAGNOSIS — I480 Paroxysmal atrial fibrillation: Secondary | ICD-10-CM | POA: Diagnosis not present

## 2016-09-11 DIAGNOSIS — I509 Heart failure, unspecified: Secondary | ICD-10-CM | POA: Diagnosis not present

## 2016-09-11 DIAGNOSIS — G4737 Central sleep apnea in conditions classified elsewhere: Secondary | ICD-10-CM

## 2016-09-11 DIAGNOSIS — G35 Multiple sclerosis: Secondary | ICD-10-CM

## 2016-09-11 MED ORDER — INTERFERON BETA-1B 0.3 MG ~~LOC~~ KIT: PACK | SUBCUTANEOUS | 12 refills | Status: DC

## 2016-09-11 MED ORDER — CORTICOTROPIN 80 UNIT/ML IJ GEL: 80.0000 [IU] | Freq: Every day | INTRAMUSCULAR | 5 refills | Status: DC

## 2016-09-11 MED ORDER — CORTICOTROPIN 80 UNIT/ML IJ GEL: 80.0000 [IU] | INTRAMUSCULAR | 6 refills | Status: DC

## 2016-09-11 NOTE — Telephone Encounter (Signed)
Pt reports that she has not heard anything from a pharmacy regarding her betaseron. I called [facility] who informed me that since pt has medicare part d, she has to be done through pt assistance and that I need to call them between 9-5 at [phone] and discuss with them. Will call them tomorrow.

## 2016-09-11 NOTE — Progress Notes (Signed)
Provider:  [name], M D  Referring Provider: Prince Solian, MD Primary Care Physician:  [name], MD  Chief Complaint  Patient presents with  . Follow-up    bipap, MS relapse    HPI:  Toni [name] is a 71 y.o. female seen here as a revisit for MS follow up.   The patient has had her baclofen pump replaced in December 2015 and March 2016 . The pump refill went well.  Before the pump was replaced, she had noted a higher and higher need for oral baclofen supplement. She uses ACTH to avoid the previously steroid insuced a fib , she has less weight gain and less HTN on the ACTH. Her right leg is weaker than last year, but she has not fallen. She is her for a routine visit , has no acute questions.  Her extensive history is re viewed in previous noted.   01-25-15  CD Is until is here for routine visit today and she brought me a beautiful Gardenia blossom. Her sleepiness score is endorsed at 8 points fatigue severity 34, we have today also a download from her [employer] auto system. Unfortunately the data very concerning. By the patient is compliant with 4 hours and 25 minutes of daily use and 96.7% compliance 29 out of 30 days. She has had a mean pressure of 12 cm water peak pressure of 14 cm water but an average AHI of 37.9.  MS; Toni [name]'s fine motor skills and ambulatory skills have significantly declined over the last 5 years - she is now mostly seen here in a wheelchair, but she can transfer. She continues to use the baclofen pump which is followed by Dr. [name]. Cognitive decline was a concern but 3 sequenced  Montral cognitive assessment tests showed 30 and 29 out of 30 points and word fluency of 13 words.  She also endorsed the Epworth sleepiness score at 11 points and the fatigue severity score was not endorsed. Odema: severe ankle edema and veinous insufficiency. On diuretics. Not using compression stockings.  She has anasarca when she wakes first in the  morning. She has low albumin. She takes a protein rich diet. She is losing muscle mass.  Osteopenia. Arthritis. once a week for mobility maintenance. I will renew her prescription.  Interval history from the 31-05 2017. Toni [name] is here today for a revisit. This included a Montral cognitive assessment test which she aced with 29 out of 30 points. She endorsed the geriatric depression score at 5 points the fatigue severity score at 24 points, and the Epworth score at 14 points.She reports that she has a feeling her CPAP does not pick up flow when she holds her breath something that her old machine would have done. She was given this replacement by [facility] was originally set up with [facility]. Her new machine however seems not to give her the airflow she craves. In interrogation into the machine today was not possible as the memory chip was supposedly "corrupted".  Toni [name] fields more unsteady when she transfers now, it feels a little bit like vertigo to her without being a classic vertigo. She also has continued to feel burning in her left hand especially. She reports that she feels she has no  "Oomph".Her atrial fibrillation has become chronic and is no longer paroxysmal, this may also take some of her energy away. Her ejection fraction may be significantly impaired. She continues to have ankle edema. 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 Baclofen he still helps with the spasticity but also can leave her probably, gabapentin 4 tablets a day one of the morning one at lunch 2 at night helped with a burning sensation of the hand but has not alleviated his completely. Overall her download on his CPAP also shows a very high residual AHI of 38. I am concerned that this is an artifact related to high air leaks.  Her average device pressure is 13.6 cm water.   Interval history from 04/20/2016.Atrial fibrillation unaffected by CPAP use. [name] also says that she  noted that during micturition she tends to hold her breath without knowing it or doing it on purpose. She has not fainted by relieving herself but she has noted a concerted effort as needed to continue breathing. Her neurogenic bladder also requested to relax take a deep breath and actually concentrate on urination. She also falls asleep more often now especially while watching TV. She's not even aware that she missed how to show until is over.  Interval history from 09/11/2016, Toni [name] returns after her BiPAP titration. She was evaluated on 05/24/2016 had an AHI of 34.7 that was much worse in non-REM sleep than in rem sleep. Supine AHI was 45.4 sleep efficiency was 83% sleep latency was long at 49 minutes BiPAP was initiated after CPAP failed to create a significant reduction in apneas and treatment emergent central apneas were noted. Beginning at 16/12 cm water the machine was step by step increased to 18/14 the AHI remained at 7.1 which was a significant reduction but not yet ideal. ASV was not helpful the patient did best at BiPAP 18/14 with 12 stimulated breast per minute. History of complex sleep apnea I also have compliance report today which is excellent Toni [name] has used the machine 90% of the time and 20 out of 30 days over 4 hours. She often struggles to reach her 5 hour [place]. The average user time is 5 hours and 4 minutes, she is using an air curve V auto machine- inspiratory pressure 18 expiratory pressure 13 cm water, easy breath function is on - BUT  the residual apnea index was 28.5  (difficult to explain from me as the patient's obstructive apneas made 17.8 and central apnea 6.2). She doesn't struggle with air leaks on a full face mask and her alternative is an nasal mask with his chin strap she brought both  With her today ; the air- fit  F 10 , now a maganetic clasp mask - this fits her better.  She is still at [facility] , still has atrial fib, and she has been using acthar for MS  treatment.  During our last visit just before the years and the patient discussed with me her recent cervical spine and MRI head image results. But is clearly evidence of multiple sclerosis and the lesions are rather typically distributed, this for the first time a cervical spine lesion noted. This is not fresh or acute there is no evidence of active demyelination. We are discussing today if we should change her from Acthar to Betaseron or if she should use both medications. I am and support of starting Betaseron back, and in addition having the Acthar as a treatment for progressive MS . I will file a preauthorization form today for both medications. The patient is in agreement with his combination therapy. She tolerated acthar very well, no glucose elevation , no BP elevation.  She  will have PT, OT and nursing care at home.   Excellent cognition. Montreal Cognitive Assessment  10/04/2015 06/02/2015 03/30/2015  Visuospatial/ Executive (0/5) _0 Naming (0/3) _1 Attention: Read list of digits (0/2) _2 Attention: Read list of letters (0/1) _3 Attention: Serial 7 subtraction starting at 100 (0/3) _4 Language: Repeat phrase (0/2) _5 Language : Fluency (0/1) _6 Abstraction (0/2) _7 Delayed Recall (0/5) _8 Orientation (0/6) _9 Total _10 Adjusted Score (based on education) _11 Review of Systems: Out of a complete 14 system review, the patient complains of only the following symptoms, and all other reviewed systems are negative. Wheelchair bound.  Now back at home after rehab with PT> carpal tunnel.  Epworth 8 and FSS 34.She has abdominal swelling and she feels like baclofen or the baclofen pump may contribute to some of these abdominal tightness and sweating feeling.  The 90th percentile pressure is 14 cm water with an average central apnea index of 20.3 and obstructive apnea residual index of 9.5 very few hypopneas are noted. The average  airleak right now is only between 4 and 5 minutes each night or 1.5% of the total night. I don't think that air leakage contributes to the high residual AHI. I will need to place her on BIpAP or ASV.   Social History   Social History  . Marital status: Married    Spouse name: N/A  . Number of children: 2  . Years of education: COLLEGE   Occupational History  . RETIRED    Social History Main Topics  . Smoking status: Former Smoker    Packs/day: 0.50    Years: 7.00    Types: Cigarettes    Quit date: 08/21/1976  . Smokeless tobacco: Never Used  . Alcohol use 0.6 oz/week    1 Glasses of wine per week  . Drug use: No  . Sexual activity: Not Currently   Other Topics Concern  . Not on file   Social History Narrative   Lives in [place] with Tillman Sers [phone], [phone]   Currently in rehab       Family History  Problem Relation Age of Onset  . Coronary artery disease Father     at age 25  . Coronary artery disease Maternal Grandmother   . Depression Maternal Grandmother   . Cancer Paternal Grandmother   . Depression Mother   . Alcoholism Son     Past Medical History:  Diagnosis Date  . Abnormality of gait 03/01/2015  . Anxiety   . Arthritis    "knees" (01/19/2016)  . Atrial fibrillation with RVR ([place]) 01/19/2016  . Childhood asthma   . Chronic pain    "nerve pain from the MS"  . Depression   . DVT (deep venous thrombosis) ([place]) 1983   "RLE; may have just been phlebitis; it was before the age of dopplers"  . 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 GERD (gastroesophageal reflux disease)   . History of stress test 06/21/10   limited exam with some degree of breast attenuatin of the apex however a component of apical ischemia is not excluded, EF 62%; cardiac cath   . HLD (hyperlipidemia)   .  Hx of echocardiogram 04/22/10   EF 55-60%, no valve issues  . Hypertension   . Hypothyroidism   . Migraine    "none since I  stopped beta blocker in 1990s" (01/19/2016)  . Multiple sclerosis ([place])    has had this may 1989-[place] reg doc  . OSA on CPAP   . PAF (paroxysmal atrial fibrillation) ([place])    on coumadin; documented on monitor 06/2010  . Pneumonia 11/2011  . Shortness of breath   . Thyroid disease     Current Outpatient Prescriptions  Medication Sig Dispense Refill  . acetaminophen (TYLENOL) 500 MG tablet Take 500-1,000 mg by mouth every 6 (six) hours as needed for fever or headache (or pain).    Marland Kitchen ALPRAZolam (XANAX) 0.5 MG tablet Take 1 tablet (0.5 mg total) by mouth 2 (two) times daily as needed for anxiety. 20 tablet 0  . baclofen (LIORESAL) 20 MG tablet Take 1 tablet (20 mg total) by mouth 3 (three) times daily as needed for muscle spasms. (Patient taking differently: Take 20 mg by mouth See admin instructions. Once to twice a day for breakthrough spasms) 180 each 3  . buPROPion (WELLBUTRIN XL) 300 MG 24 hr tablet Take 300 mg by mouth daily.    Marland Kitchen CALCIUM CITRATE PO Take 2 tablets by mouth 2 (two) times daily.    . cetirizine (ZYRTEC) 10 MG tablet Take 10 mg by mouth daily.    . Cholecalciferol (VITAMIN D) 2000 units tablet Take 2,000 Units by mouth 2 (two) times daily.    Marland Kitchen co-enzyme Q-10 30 MG capsule Take 30 mg by mouth daily.    . corticotropin (ATHACAR H.P.) 80 UNIT/ML injectable gel Inject 1 mL (80 Units total) into the skin every other day. To achieve control of relapsing MS Symptoms. (total of 5 injections per month) 5 mL 6  . diltiazem (CARDIZEM CD) 120 MG 24 hr capsule Take 1 capsule (120 mg total) by mouth daily. 90 capsule 3  . docusate sodium (COLACE) 100 MG capsule Take 1 capsule (100 mg total) by mouth daily. 10 capsule 0  . fluticasone (FLONASE) 50 MCG/ACT nasal spray Place 2 sprays into both nostrils daily as needed for allergies.     . furosemide (LASIX) 40 MG tablet Take 1.5 tablets (60 mg total) by mouth daily. Take 60 mg by mouth daily (Patient taking differently: Take 60 mg by mouth  daily. May take an additional 40 mg as needed if edema is still present) 135 tablet 3  . gabapentin (NEURONTIN) 300 MG capsule Take 1 capsule by mouth up to four times daily. (Patient taking differently: Take 300 mg by mouth See admin instructions. Three to four times a day) 360 capsule 3  . GENERLAC 10 GM/15ML SOLN TAKE 7.5 ML BY MOUTH TWICE DAILY 900 mL 5  . Interferon Beta-1b (BETASERON/EXTAVIA) 0.3 MG KIT injection Every other day , subcutaneus. 1 each 12  . levothyroxine (SYNTHROID, LEVOTHROID) 75 MCG tablet Take 75 mcg by mouth daily.    . Multiple Vitamins-Minerals (ONE-A-DAY WOMENS 50+ ADVANTAGE) TABS Take 1 tablet by mouth daily.    . Nebivolol HCl (BYSTOLIC) 20 MG TABS Take 20 mg by mouth daily.    . Omega-3 Fatty Acids (FISH OIL) 1000 MG CAPS Take 1,000 mg by mouth daily.    Marland Kitchen omeprazole (PRILOSEC) 20 MG capsule Take 20 mg by mouth daily.     . psyllium (METAMUCIL SMOOTH TEXTURE) 28 % packet Take 1 packet by mouth every other day.    Marland Kitchen  ranitidine (ZANTAC) 150 MG tablet Take 150 mg by mouth at bedtime.     . simvastatin (ZOCOR) 20 MG tablet Take 10 mg by mouth at bedtime.     . SYRINGE/NEEDLE, DISP, 1 ML (BD LUER-LOK SYRINGE) 25G X 5/8" 1 ML MISC Use as directed to inject  Acthar 10 each prn  . Tamsulosin HCl (FLOMAX) 0.4 MG CAPS Take 0.4 mg by mouth daily.    Marland Kitchen UNABLE TO FIND Baclofen pump: Continuous    . warfarin (COUMADIN) 5 MG tablet Take 1 tablet by mouth daily or as directed by coumadin clinic. (Patient taking differently: Take 2.5-5 mg by mouth See admin instructions. 2.5 mg at bedtime on Sun/Wed and 5 mg on Mon/Tues/Thurs/Fri/Sat) 90 tablet 2   Current Facility-Administered Medications  Medication Dose Route Frequency Provider Last Rate Last Dose  . baclofen (LIORESAL) intrathecal injection 40 mg/64m  40 mg Intrathecal Once CKathrynn Ducking MD        Allergies as of 09/11/2016 - Review Complete 09/11/2016  Allergen Reaction Noted  . Hydrocodone Nausea And Vomiting  11/26/2011  . Oxycodone Nausea And Vomiting 05/12/2013  . Chlorhexidine gluconate Rash 11/26/2011  . Zoloft [sertraline] Hives, Swelling, and Rash 05/15/2013    Vitals: BP 110/61   Pulse 77   Resp 20  Last Weight:  Wt Readings from Last 1 Encounters:  08/17/16 279 lb (126.6 kg)   Last Height:   Ht Readings from Last 1 Encounters:  08/17/16 _0  (1.6 m)    Physical exam:   Height ;  5.4 ", 260 pounds,   General: The patient is awake, alert and appears not in acute distress. The patient is well groomed.  Head: Normocephalic, atraumatic. Neck is supple.  Mallampati 4 , with lateral narrowing. neck circumference: 20.25  inches .  Cardiovascular: irregular rate and rhythm today- 09-12-2015 -, without murmurs or carotid bruit, and without distended neck veins.  Respiratory: Lungs are clear to auscultation.  Skin: evidence of edema, not Rash, Injection site reaction. Thighs are bruised.   Trunk: BMI is elevated. Neurologic exam : The patient is awake and alert, oriented to place and time.  Memory subjective described as intact. There is a normal attention span & concentration ability.  Speech without dysarthria, dysphonia , delayed wordfinding- aphasia.  Mood and affect are appropriate.   Cranial nerves:  Pupils are equal and briskly reactive to light. Extraocular movements in vertical and horizontal planes intact and without nystagmus. Visual fields by finger perimetry are intact.  Hearing to finger rub intact. Facial sensation intact to fine touch.  Facial motor strength is symmetric and tongue and uvula move midline.   Deep tendon reflexes: in the upper and lower extremities are attenuated, right Babinski positive- up going.    ASSESSMENT AND PLAN  71y.o. year old female Patient with secondary progressive  multiple sclerosis; Hypertension; PAF (paroxysmal atrial fibrillation); Thyroid disease; obesity hypoventilation,   Pneumonia; Sleep apnea; Hyperthyroidism; Edema;     Acthar re -established. Causes bruising easliy but keeps her from relapses, and she is less fatigued. She reports memory lapses, but recalled all 5 words on MOCA. Since that the baclofen pump was implanted she has from time to time had severe abdominal tightness, tenderness the feeling of a swollen abdomen. The cause was not found however she is again having the same symptoms. She does feel that she sleeps better with BiPAP on and she slept the first night 11 hours, I will increase the inspiratory pressure by 2  cm. I will also have her use some mask with magnetic clasps. I see no interference to her baclofen pump from the small magnet. I will keep her on Acthar and Betaseron will be reinitiated. Atrial fib - Dr Debara Pickett    1)MS - Spasticity on baclofen. Continues Baclofen PUMP follow up  with Dr [name]. She was admitted to the hospital after she had difficulties finishing a crossword puzzle and had dexterity issues. She also noticed the same weakness affecting not just her right dominant hand but also her right lower extremity. MRIs obtained in hospital on the second now on 08/08/2016 showed chronic demyelination, cervical spine to probable cord lesions one peripherally on the left between C3 and C4 and another one on the right between C6 and see 7 this could be chronic to however the patient had quite significant right-sided hemiparesis weakness and will be treated as an MS relapse. Acthar and Betaseron.   2_CSA/ complex apneas.  - on BiPAP ASV now-  Too many residual apneas are obstructive now. Increase pressure to 20 over 15 cm water. Increase daily use to 5.5 hours.     Asencion Partridge Lankford Gutzmer MD  09/11/2016

## 2016-09-12 ENCOUNTER — Other Ambulatory Visit: Payer: Self-pay

## 2016-09-12 ENCOUNTER — Telehealth: Payer: Self-pay

## 2016-09-12 ENCOUNTER — Telehealth: Payer: Self-pay | Admitting: Neurology

## 2016-09-12 MED ORDER — INTERFERON BETA-1B 0.3 MG ~~LOC~~ KIT: PACK | SUBCUTANEOUS | 12 refills | Status: DC

## 2016-09-12 MED ORDER — CORTICOTROPIN 80 UNIT/ML IJ GEL: INTRAMUSCULAR | 5 refills | Status: DC

## 2016-09-12 NOTE — Telephone Encounter (Signed)
I refilled ( with Kristin's help) ACTHAR for 80 mg  Times 5 injections.

## 2016-09-12 NOTE — Telephone Encounter (Signed)
I called the patient assistance program for betaseron. They advised me that pt may be approved for betaseron through other foundations and if that is the case, pt assistance cannot help this pt. However, if pt does not have any source of grants, then the pt can use pt assistance. However, pt needs to call the pt assistance program to discuss.  I called the pt and spoke to pt and her husband and advised them of this. They report that pt is approved for some grant. They are asking that the RX for betaseron be sent to [facility]. I will send the RX to [facility], and still asked them to call the pt assistance program for betseron to ensure that the financial side is squared away. I gave them the phone number. Pt verbalized understanding.

## 2016-09-12 NOTE — Telephone Encounter (Signed)
I called [facility]. Spoke to [employer]. Eli reports to me that pt has been approved and that the RX for betaseron should go to [name]. Someone will be faxing me a pa to do for this pt today.  I called pt to discuss. No answer, left a message asking her to call me back.

## 2016-09-12 NOTE — Telephone Encounter (Signed)
Pt returned my call. She verified that they did speak with [facility] and need the RX for betaseron sent to [name]. I asked pt to please call me within a week if she has not heard anything further about getting her betaseron. Pt verbalized understanding.

## 2016-09-12 NOTE — Telephone Encounter (Signed)
I am completing pa for acthar for pt. However, there are two different prescriptions for acthar ordered by Dr. Brett Fairy on 09/11/2016. One sig is for pt to inject 80mg  into muscle once daily. Another sig says pt should inject 80 mg into muscle every other day with a total of 5 injections per month.  Please review and d/c the incorrect order.

## 2016-09-12 NOTE — Telephone Encounter (Signed)
Received a verbal order from Dr. Brett Fairy to order acthar 80 units to be injected into muscle once daily for 5 consecutive days every 30 days. Order placed.  PA for acthar completed, sent to [facility], should have a determination in 1-3 business days.

## 2016-09-12 NOTE — Telephone Encounter (Signed)
RX for betaseron faxed to [name]. Received a receipt of confirmation.

## 2016-09-12 NOTE — Telephone Encounter (Signed)
Christina/[employer] PAP [phone] called reg Interferon Beta-1b (BETASERON/EXTAVIA) 0.3 MG KIT injection . She has been approved for a grant to cover the cost. She needs RX faxed to [name] (f) [phone]  (p) [phone]. Says she talked with pt's husband and the pt did not want to use [facility]. She also wants to know if there is a PA.

## 2016-09-12 NOTE — Telephone Encounter (Signed)
This encounter was created in error - please disregard.

## 2016-09-13 ENCOUNTER — Ambulatory Visit (INDEPENDENT_AMBULATORY_CARE_PROVIDER_SITE_OTHER): Payer: Medicare Other | Admitting: Pharmacist

## 2016-09-13 ENCOUNTER — Telehealth: Payer: Self-pay | Admitting: Internal Medicine

## 2016-09-13 DIAGNOSIS — I4891 Unspecified atrial fibrillation: Secondary | ICD-10-CM

## 2016-09-13 MED ORDER — WARFARIN SODIUM 3 MG PO TABS: ORAL_TABLET | ORAL | 0 refills | Status: DC

## 2016-09-13 NOTE — Telephone Encounter (Signed)
New Message  Pt call requesting to speak with RN about a coumadin appt. Please call back to discuss

## 2016-09-13 NOTE — Telephone Encounter (Signed)
Patient discharged from SNF and need new rx for warfarin. Home Health will do POC INR and call results to [facility].  New Rx for warfarin 3mg  tablets sent to prefer pharmacy.  Next INR on 09/15/16

## 2016-09-13 NOTE — Telephone Encounter (Signed)
PA for acthar approved through 08/20/2017 by [employer].

## 2016-09-14 ENCOUNTER — Other Ambulatory Visit: Payer: Self-pay

## 2016-09-14 ENCOUNTER — Telehealth: Payer: Self-pay | Admitting: *Deleted

## 2016-09-14 MED ORDER — CORTICOTROPIN 80 UNIT/ML IJ GEL: INTRAMUSCULAR | 5 refills | Status: DC

## 2016-09-14 NOTE — Telephone Encounter (Signed)
Received a phone call from [place] pharmacist. Pt had been doing a subcutaneous injection of acthar. Now, the RX says to do an IM injection. Pharmacist wants to verify which is correct; the IM or subq injection of acthar?  Should call back at [phone].

## 2016-09-14 NOTE — Telephone Encounter (Signed)
I spoke to pharmacist and corrected injection to subcutaneously. Thank You.

## 2016-09-14 NOTE — Telephone Encounter (Signed)
Corrected prescription to subcutaneous use from intramuscular use. Spoke to pharmacist at [facility]. CD

## 2016-09-14 NOTE — Telephone Encounter (Signed)
Received call from Evelena Leyden, pharmacist at [facility] for clarification on [place]. She stated she received prescription with sig: inject into muscle. She is asking if this is a change or should it have read inject subcutaneously. She stated pt has been giving medication SQ for a long time. She is requesting a call back asap to clarify. She stated the medication needs to be shipped today in order to keep patient current with her supply.

## 2016-09-15 DIAGNOSIS — I10 Essential (primary) hypertension: Secondary | ICD-10-CM | POA: Diagnosis not present

## 2016-09-15 DIAGNOSIS — N319 Neuromuscular dysfunction of bladder, unspecified: Secondary | ICD-10-CM | POA: Diagnosis not present

## 2016-09-15 DIAGNOSIS — E039 Hypothyroidism, unspecified: Secondary | ICD-10-CM | POA: Diagnosis not present

## 2016-09-15 DIAGNOSIS — K592 Neurogenic bowel, not elsewhere classified: Secondary | ICD-10-CM | POA: Diagnosis not present

## 2016-09-15 DIAGNOSIS — G35 Multiple sclerosis: Secondary | ICD-10-CM | POA: Diagnosis not present

## 2016-09-15 DIAGNOSIS — I4891 Unspecified atrial fibrillation: Secondary | ICD-10-CM | POA: Diagnosis not present

## 2016-09-18 DIAGNOSIS — G4733 Obstructive sleep apnea (adult) (pediatric): Secondary | ICD-10-CM | POA: Diagnosis not present

## 2016-09-18 DIAGNOSIS — I480 Paroxysmal atrial fibrillation: Secondary | ICD-10-CM | POA: Diagnosis not present

## 2016-09-18 DIAGNOSIS — I4891 Unspecified atrial fibrillation: Secondary | ICD-10-CM | POA: Diagnosis not present

## 2016-09-18 DIAGNOSIS — R609 Edema, unspecified: Secondary | ICD-10-CM | POA: Diagnosis not present

## 2016-09-18 DIAGNOSIS — K592 Neurogenic bowel, not elsewhere classified: Secondary | ICD-10-CM | POA: Diagnosis not present

## 2016-09-18 DIAGNOSIS — E039 Hypothyroidism, unspecified: Secondary | ICD-10-CM | POA: Diagnosis not present

## 2016-09-18 DIAGNOSIS — G35 Multiple sclerosis: Secondary | ICD-10-CM | POA: Diagnosis not present

## 2016-09-18 DIAGNOSIS — I10 Essential (primary) hypertension: Secondary | ICD-10-CM | POA: Diagnosis not present

## 2016-09-18 DIAGNOSIS — N319 Neuromuscular dysfunction of bladder, unspecified: Secondary | ICD-10-CM | POA: Diagnosis not present

## 2016-09-18 NOTE — Telephone Encounter (Signed)
Patient d/c from SNF over weekend.  Has first visit with [facility] RN on Tuesday Jan 30.  Will get INR at that time.  Patient aware that INR should be called to [place] at [facility]

## 2016-09-19 ENCOUNTER — Ambulatory Visit (INDEPENDENT_AMBULATORY_CARE_PROVIDER_SITE_OTHER): Payer: Medicare Other | Admitting: Pharmacist Clinician (PhC)/ Clinical Pharmacy Specialist

## 2016-09-19 DIAGNOSIS — G35 Multiple sclerosis: Secondary | ICD-10-CM | POA: Diagnosis not present

## 2016-09-19 DIAGNOSIS — I10 Essential (primary) hypertension: Secondary | ICD-10-CM | POA: Diagnosis not present

## 2016-09-19 DIAGNOSIS — I4891 Unspecified atrial fibrillation: Secondary | ICD-10-CM

## 2016-09-19 DIAGNOSIS — E039 Hypothyroidism, unspecified: Secondary | ICD-10-CM | POA: Diagnosis not present

## 2016-09-19 DIAGNOSIS — N319 Neuromuscular dysfunction of bladder, unspecified: Secondary | ICD-10-CM | POA: Diagnosis not present

## 2016-09-19 DIAGNOSIS — K592 Neurogenic bowel, not elsewhere classified: Secondary | ICD-10-CM | POA: Diagnosis not present

## 2016-09-19 LAB — POCT INR: INR: 2.1

## 2016-09-20 ENCOUNTER — Telehealth: Payer: Self-pay | Admitting: Neurology

## 2016-09-20 DIAGNOSIS — E039 Hypothyroidism, unspecified: Secondary | ICD-10-CM | POA: Diagnosis not present

## 2016-09-20 DIAGNOSIS — I10 Essential (primary) hypertension: Secondary | ICD-10-CM | POA: Diagnosis not present

## 2016-09-20 DIAGNOSIS — G35 Multiple sclerosis: Secondary | ICD-10-CM | POA: Diagnosis not present

## 2016-09-20 DIAGNOSIS — K592 Neurogenic bowel, not elsewhere classified: Secondary | ICD-10-CM | POA: Diagnosis not present

## 2016-09-20 DIAGNOSIS — N319 Neuromuscular dysfunction of bladder, unspecified: Secondary | ICD-10-CM | POA: Diagnosis not present

## 2016-09-20 DIAGNOSIS — I4891 Unspecified atrial fibrillation: Secondary | ICD-10-CM | POA: Diagnosis not present

## 2016-09-20 NOTE — Telephone Encounter (Signed)
I called pt. She is still working on getting assistance for betaseron. She was told that she can only use one assistance program for both acthar and betaseron and if she used another pap, she could lose coverage. Pt will call me back if she needs anything further from [name].

## 2016-09-20 NOTE — Telephone Encounter (Signed)
Toni [name] with [employer] called in reference to patients Interferon Beta-1b (BETASERON/EXTAVIA) 0.3 MG KIT injection.  States she reached out to the patient to confirm patients benefits and patient would not discuss anything because she said she does not use their pharmacy.  Please call °

## 2016-09-21 DIAGNOSIS — N319 Neuromuscular dysfunction of bladder, unspecified: Secondary | ICD-10-CM | POA: Diagnosis not present

## 2016-09-21 DIAGNOSIS — G35 Multiple sclerosis: Secondary | ICD-10-CM | POA: Diagnosis not present

## 2016-09-21 DIAGNOSIS — I4891 Unspecified atrial fibrillation: Secondary | ICD-10-CM | POA: Diagnosis not present

## 2016-09-21 DIAGNOSIS — I10 Essential (primary) hypertension: Secondary | ICD-10-CM | POA: Diagnosis not present

## 2016-09-21 DIAGNOSIS — E039 Hypothyroidism, unspecified: Secondary | ICD-10-CM | POA: Diagnosis not present

## 2016-09-21 DIAGNOSIS — K592 Neurogenic bowel, not elsewhere classified: Secondary | ICD-10-CM | POA: Diagnosis not present

## 2016-09-22 DIAGNOSIS — G35 Multiple sclerosis: Secondary | ICD-10-CM | POA: Diagnosis not present

## 2016-09-22 DIAGNOSIS — I10 Essential (primary) hypertension: Secondary | ICD-10-CM | POA: Diagnosis not present

## 2016-09-22 DIAGNOSIS — K592 Neurogenic bowel, not elsewhere classified: Secondary | ICD-10-CM | POA: Diagnosis not present

## 2016-09-22 DIAGNOSIS — E039 Hypothyroidism, unspecified: Secondary | ICD-10-CM | POA: Diagnosis not present

## 2016-09-22 DIAGNOSIS — I4891 Unspecified atrial fibrillation: Secondary | ICD-10-CM | POA: Diagnosis not present

## 2016-09-22 DIAGNOSIS — N319 Neuromuscular dysfunction of bladder, unspecified: Secondary | ICD-10-CM | POA: Diagnosis not present

## 2016-09-25 ENCOUNTER — Telehealth: Payer: Self-pay | Admitting: Internal Medicine

## 2016-09-25 DIAGNOSIS — I4891 Unspecified atrial fibrillation: Secondary | ICD-10-CM | POA: Diagnosis not present

## 2016-09-25 DIAGNOSIS — N319 Neuromuscular dysfunction of bladder, unspecified: Secondary | ICD-10-CM | POA: Diagnosis not present

## 2016-09-25 DIAGNOSIS — K592 Neurogenic bowel, not elsewhere classified: Secondary | ICD-10-CM | POA: Diagnosis not present

## 2016-09-25 DIAGNOSIS — G35 Multiple sclerosis: Secondary | ICD-10-CM | POA: Diagnosis not present

## 2016-09-25 DIAGNOSIS — E039 Hypothyroidism, unspecified: Secondary | ICD-10-CM | POA: Diagnosis not present

## 2016-09-25 DIAGNOSIS — I10 Essential (primary) hypertension: Secondary | ICD-10-CM | POA: Diagnosis not present

## 2016-09-25 NOTE — Telephone Encounter (Signed)
Faxed signed order r/t PT/INR & warfarin dose to [facility]

## 2016-09-25 NOTE — Telephone Encounter (Signed)
Dominica/[facility] [phone] called she would like to speak with RN.

## 2016-09-26 ENCOUNTER — Ambulatory Visit (INDEPENDENT_AMBULATORY_CARE_PROVIDER_SITE_OTHER): Payer: Medicare Other | Admitting: Pharmacist

## 2016-09-26 DIAGNOSIS — K592 Neurogenic bowel, not elsewhere classified: Secondary | ICD-10-CM | POA: Diagnosis not present

## 2016-09-26 DIAGNOSIS — I4891 Unspecified atrial fibrillation: Secondary | ICD-10-CM | POA: Diagnosis not present

## 2016-09-26 DIAGNOSIS — I10 Essential (primary) hypertension: Secondary | ICD-10-CM | POA: Diagnosis not present

## 2016-09-26 DIAGNOSIS — N319 Neuromuscular dysfunction of bladder, unspecified: Secondary | ICD-10-CM | POA: Diagnosis not present

## 2016-09-26 DIAGNOSIS — G35 Multiple sclerosis: Secondary | ICD-10-CM | POA: Diagnosis not present

## 2016-09-26 DIAGNOSIS — E039 Hypothyroidism, unspecified: Secondary | ICD-10-CM | POA: Diagnosis not present

## 2016-09-26 LAB — PROTIME-INR: INR: 2.5 — AB (ref <=1.1)

## 2016-09-27 DIAGNOSIS — I4891 Unspecified atrial fibrillation: Secondary | ICD-10-CM | POA: Diagnosis not present

## 2016-09-27 DIAGNOSIS — N319 Neuromuscular dysfunction of bladder, unspecified: Secondary | ICD-10-CM | POA: Diagnosis not present

## 2016-09-27 DIAGNOSIS — E039 Hypothyroidism, unspecified: Secondary | ICD-10-CM | POA: Diagnosis not present

## 2016-09-27 DIAGNOSIS — K592 Neurogenic bowel, not elsewhere classified: Secondary | ICD-10-CM | POA: Diagnosis not present

## 2016-09-27 DIAGNOSIS — G35 Multiple sclerosis: Secondary | ICD-10-CM | POA: Diagnosis not present

## 2016-09-27 DIAGNOSIS — I10 Essential (primary) hypertension: Secondary | ICD-10-CM | POA: Diagnosis not present

## 2016-09-27 NOTE — Telephone Encounter (Signed)
I called back and LM to call back if needed. Patient is currently checking on if her grant (PA) will cover both medications. If not patient states that she prefers to stay on Acthar.

## 2016-09-29 DIAGNOSIS — E039 Hypothyroidism, unspecified: Secondary | ICD-10-CM | POA: Diagnosis not present

## 2016-09-29 DIAGNOSIS — Z961 Presence of intraocular lens: Secondary | ICD-10-CM | POA: Diagnosis not present

## 2016-09-29 DIAGNOSIS — N319 Neuromuscular dysfunction of bladder, unspecified: Secondary | ICD-10-CM | POA: Diagnosis not present

## 2016-09-29 DIAGNOSIS — G35 Multiple sclerosis: Secondary | ICD-10-CM | POA: Diagnosis not present

## 2016-09-29 DIAGNOSIS — H04123 Dry eye syndrome of bilateral lacrimal glands: Secondary | ICD-10-CM | POA: Diagnosis not present

## 2016-09-29 DIAGNOSIS — I10 Essential (primary) hypertension: Secondary | ICD-10-CM | POA: Diagnosis not present

## 2016-09-29 DIAGNOSIS — H26493 Other secondary cataract, bilateral: Secondary | ICD-10-CM | POA: Diagnosis not present

## 2016-09-29 DIAGNOSIS — I4891 Unspecified atrial fibrillation: Secondary | ICD-10-CM | POA: Diagnosis not present

## 2016-09-29 DIAGNOSIS — K592 Neurogenic bowel, not elsewhere classified: Secondary | ICD-10-CM | POA: Diagnosis not present

## 2016-10-01 DIAGNOSIS — K592 Neurogenic bowel, not elsewhere classified: Secondary | ICD-10-CM | POA: Diagnosis not present

## 2016-10-01 DIAGNOSIS — E119 Type 2 diabetes mellitus without complications: Secondary | ICD-10-CM | POA: Diagnosis not present

## 2016-10-01 DIAGNOSIS — G35 Multiple sclerosis: Secondary | ICD-10-CM | POA: Diagnosis not present

## 2016-10-01 DIAGNOSIS — G4733 Obstructive sleep apnea (adult) (pediatric): Secondary | ICD-10-CM | POA: Diagnosis not present

## 2016-10-01 DIAGNOSIS — F419 Anxiety disorder, unspecified: Secondary | ICD-10-CM | POA: Diagnosis not present

## 2016-10-01 DIAGNOSIS — F329 Major depressive disorder, single episode, unspecified: Secondary | ICD-10-CM | POA: Diagnosis not present

## 2016-10-01 DIAGNOSIS — I4891 Unspecified atrial fibrillation: Secondary | ICD-10-CM | POA: Diagnosis not present

## 2016-10-01 DIAGNOSIS — E039 Hypothyroidism, unspecified: Secondary | ICD-10-CM | POA: Diagnosis not present

## 2016-10-01 DIAGNOSIS — K219 Gastro-esophageal reflux disease without esophagitis: Secondary | ICD-10-CM | POA: Diagnosis not present

## 2016-10-01 DIAGNOSIS — Z7901 Long term (current) use of anticoagulants: Secondary | ICD-10-CM | POA: Diagnosis not present

## 2016-10-01 DIAGNOSIS — Z5181 Encounter for therapeutic drug level monitoring: Secondary | ICD-10-CM | POA: Diagnosis not present

## 2016-10-01 DIAGNOSIS — Z8701 Personal history of pneumonia (recurrent): Secondary | ICD-10-CM | POA: Diagnosis not present

## 2016-10-01 DIAGNOSIS — N319 Neuromuscular dysfunction of bladder, unspecified: Secondary | ICD-10-CM | POA: Diagnosis not present

## 2016-10-01 DIAGNOSIS — I872 Venous insufficiency (chronic) (peripheral): Secondary | ICD-10-CM | POA: Diagnosis not present

## 2016-10-01 DIAGNOSIS — I10 Essential (primary) hypertension: Secondary | ICD-10-CM | POA: Diagnosis not present

## 2016-10-01 DIAGNOSIS — E785 Hyperlipidemia, unspecified: Secondary | ICD-10-CM | POA: Diagnosis not present

## 2016-10-02 ENCOUNTER — Telehealth: Payer: Self-pay | Admitting: Neurology

## 2016-10-02 DIAGNOSIS — G35 Multiple sclerosis: Secondary | ICD-10-CM

## 2016-10-03 ENCOUNTER — Ambulatory Visit (INDEPENDENT_AMBULATORY_CARE_PROVIDER_SITE_OTHER): Payer: Medicare Other | Admitting: Pharmacist Clinician (PhC)/ Clinical Pharmacy Specialist

## 2016-10-03 DIAGNOSIS — I4891 Unspecified atrial fibrillation: Secondary | ICD-10-CM

## 2016-10-03 DIAGNOSIS — K592 Neurogenic bowel, not elsewhere classified: Secondary | ICD-10-CM | POA: Diagnosis not present

## 2016-10-03 DIAGNOSIS — N319 Neuromuscular dysfunction of bladder, unspecified: Secondary | ICD-10-CM | POA: Diagnosis not present

## 2016-10-03 DIAGNOSIS — I10 Essential (primary) hypertension: Secondary | ICD-10-CM | POA: Diagnosis not present

## 2016-10-03 DIAGNOSIS — G35 Multiple sclerosis: Secondary | ICD-10-CM | POA: Diagnosis not present

## 2016-10-03 DIAGNOSIS — E119 Type 2 diabetes mellitus without complications: Secondary | ICD-10-CM | POA: Diagnosis not present

## 2016-10-03 LAB — POCT INR: INR: 2.7

## 2016-10-04 NOTE — Telephone Encounter (Signed)
Patient is calling in reference to Interferon Beta-1b (BETASERON/EXTAVIA) 0.3 MG KIT injection.  Patient is wanting to know if she has to use [facility].  Patient is confused and would like to speak with a nurse.  Please call

## 2016-10-05 DIAGNOSIS — E119 Type 2 diabetes mellitus without complications: Secondary | ICD-10-CM | POA: Diagnosis not present

## 2016-10-05 DIAGNOSIS — N319 Neuromuscular dysfunction of bladder, unspecified: Secondary | ICD-10-CM | POA: Diagnosis not present

## 2016-10-05 DIAGNOSIS — I10 Essential (primary) hypertension: Secondary | ICD-10-CM | POA: Diagnosis not present

## 2016-10-05 DIAGNOSIS — I4891 Unspecified atrial fibrillation: Secondary | ICD-10-CM | POA: Diagnosis not present

## 2016-10-05 DIAGNOSIS — G35 Multiple sclerosis: Secondary | ICD-10-CM | POA: Diagnosis not present

## 2016-10-05 DIAGNOSIS — K592 Neurogenic bowel, not elsewhere classified: Secondary | ICD-10-CM | POA: Diagnosis not present

## 2016-10-05 MED ORDER — INTERFERON BETA-1B 0.3 MG ~~LOC~~ KIT: PACK | SUBCUTANEOUS | 12 refills | Status: DC

## 2016-10-05 NOTE — Telephone Encounter (Signed)
Yes, I need her to take it .

## 2016-10-05 NOTE — Addendum Note (Signed)
Addended by: Laurence Spates on: 10/05/2016 04:03 PM   Modules accepted: Orders

## 2016-10-05 NOTE — Telephone Encounter (Signed)
Patient states that she was confused earlier and had everything "straightened out" with patient assistance and Betaseron. She needed a new Rx sent to [facility], I have sent in.   Dr. Brett Fairy,  Patient states that she takes her Acthar every other day over a course of 10 days per month. During this time, she asks when do you want her to take her Betaseron?

## 2016-10-05 NOTE — Telephone Encounter (Signed)
I spoke to patient and she is aware that per Dr. Brett Fairy (verbal order) to take these on alternating days, not on same day

## 2016-10-10 ENCOUNTER — Telehealth: Payer: Self-pay | Admitting: Internal Medicine

## 2016-10-10 ENCOUNTER — Telehealth: Payer: Self-pay | Admitting: Neurology

## 2016-10-10 DIAGNOSIS — N319 Neuromuscular dysfunction of bladder, unspecified: Secondary | ICD-10-CM | POA: Diagnosis not present

## 2016-10-10 DIAGNOSIS — G35 Multiple sclerosis: Secondary | ICD-10-CM | POA: Diagnosis not present

## 2016-10-10 DIAGNOSIS — I4891 Unspecified atrial fibrillation: Secondary | ICD-10-CM | POA: Diagnosis not present

## 2016-10-10 DIAGNOSIS — E119 Type 2 diabetes mellitus without complications: Secondary | ICD-10-CM | POA: Diagnosis not present

## 2016-10-10 DIAGNOSIS — K592 Neurogenic bowel, not elsewhere classified: Secondary | ICD-10-CM | POA: Diagnosis not present

## 2016-10-10 DIAGNOSIS — I10 Essential (primary) hypertension: Secondary | ICD-10-CM | POA: Diagnosis not present

## 2016-10-10 NOTE — Telephone Encounter (Signed)
Faxed signed orders r/t coumadin to [facility]

## 2016-10-10 NOTE — Telephone Encounter (Signed)
Spoke to CW,MD. He stated pt needs to come before 12/04/16 which is when her next alarm will be. Offered 12/01/16 at 12pm. Pt declined and requested Thursday instead. Made appt for 11/30/16 at 1215pm, check in 12pm. Pt verbalized understanding.

## 2016-10-10 NOTE — Telephone Encounter (Addendum)
Dr [name]- please advise. Your next available work in [place] in 2/27 at 35am. There is an opening tomorrow at 330pm also if you were not going to use that for the other patient I sent you a message about.   Looks like pt was last here on 08/17/16 for pump refill.

## 2016-10-10 NOTE — Telephone Encounter (Signed)
Patient called office to see about scheduling an appt for her baclofen pump refill.  Please call

## 2016-10-12 DIAGNOSIS — E119 Type 2 diabetes mellitus without complications: Secondary | ICD-10-CM | POA: Diagnosis not present

## 2016-10-12 DIAGNOSIS — K592 Neurogenic bowel, not elsewhere classified: Secondary | ICD-10-CM | POA: Diagnosis not present

## 2016-10-12 DIAGNOSIS — G35 Multiple sclerosis: Secondary | ICD-10-CM | POA: Diagnosis not present

## 2016-10-12 DIAGNOSIS — I4891 Unspecified atrial fibrillation: Secondary | ICD-10-CM | POA: Diagnosis not present

## 2016-10-12 DIAGNOSIS — N319 Neuromuscular dysfunction of bladder, unspecified: Secondary | ICD-10-CM | POA: Diagnosis not present

## 2016-10-12 DIAGNOSIS — I10 Essential (primary) hypertension: Secondary | ICD-10-CM | POA: Diagnosis not present

## 2016-10-13 DIAGNOSIS — I10 Essential (primary) hypertension: Secondary | ICD-10-CM | POA: Diagnosis not present

## 2016-10-13 DIAGNOSIS — G35 Multiple sclerosis: Secondary | ICD-10-CM | POA: Diagnosis not present

## 2016-10-13 DIAGNOSIS — I4891 Unspecified atrial fibrillation: Secondary | ICD-10-CM | POA: Diagnosis not present

## 2016-10-13 DIAGNOSIS — E119 Type 2 diabetes mellitus without complications: Secondary | ICD-10-CM | POA: Diagnosis not present

## 2016-10-13 DIAGNOSIS — K592 Neurogenic bowel, not elsewhere classified: Secondary | ICD-10-CM | POA: Diagnosis not present

## 2016-10-13 DIAGNOSIS — N319 Neuromuscular dysfunction of bladder, unspecified: Secondary | ICD-10-CM | POA: Diagnosis not present

## 2016-10-16 DIAGNOSIS — K592 Neurogenic bowel, not elsewhere classified: Secondary | ICD-10-CM | POA: Diagnosis not present

## 2016-10-16 DIAGNOSIS — G35 Multiple sclerosis: Secondary | ICD-10-CM | POA: Diagnosis not present

## 2016-10-16 DIAGNOSIS — E119 Type 2 diabetes mellitus without complications: Secondary | ICD-10-CM | POA: Diagnosis not present

## 2016-10-16 DIAGNOSIS — N319 Neuromuscular dysfunction of bladder, unspecified: Secondary | ICD-10-CM | POA: Diagnosis not present

## 2016-10-16 DIAGNOSIS — I10 Essential (primary) hypertension: Secondary | ICD-10-CM | POA: Diagnosis not present

## 2016-10-16 DIAGNOSIS — I4891 Unspecified atrial fibrillation: Secondary | ICD-10-CM | POA: Diagnosis not present

## 2016-10-17 ENCOUNTER — Ambulatory Visit (INDEPENDENT_AMBULATORY_CARE_PROVIDER_SITE_OTHER): Payer: Medicare Other | Admitting: Pharmacist Clinician (PhC)/ Clinical Pharmacy Specialist

## 2016-10-17 DIAGNOSIS — I10 Essential (primary) hypertension: Secondary | ICD-10-CM | POA: Diagnosis not present

## 2016-10-17 DIAGNOSIS — E119 Type 2 diabetes mellitus without complications: Secondary | ICD-10-CM | POA: Diagnosis not present

## 2016-10-17 DIAGNOSIS — I4891 Unspecified atrial fibrillation: Secondary | ICD-10-CM

## 2016-10-17 DIAGNOSIS — K592 Neurogenic bowel, not elsewhere classified: Secondary | ICD-10-CM | POA: Diagnosis not present

## 2016-10-17 DIAGNOSIS — G35 Multiple sclerosis: Secondary | ICD-10-CM | POA: Diagnosis not present

## 2016-10-17 DIAGNOSIS — N319 Neuromuscular dysfunction of bladder, unspecified: Secondary | ICD-10-CM | POA: Diagnosis not present

## 2016-10-17 LAB — POCT INR: INR: 2.2

## 2016-10-18 DIAGNOSIS — I4891 Unspecified atrial fibrillation: Secondary | ICD-10-CM | POA: Diagnosis not present

## 2016-10-18 DIAGNOSIS — E119 Type 2 diabetes mellitus without complications: Secondary | ICD-10-CM | POA: Diagnosis not present

## 2016-10-18 DIAGNOSIS — N319 Neuromuscular dysfunction of bladder, unspecified: Secondary | ICD-10-CM | POA: Diagnosis not present

## 2016-10-18 DIAGNOSIS — K592 Neurogenic bowel, not elsewhere classified: Secondary | ICD-10-CM | POA: Diagnosis not present

## 2016-10-18 DIAGNOSIS — G35 Multiple sclerosis: Secondary | ICD-10-CM | POA: Diagnosis not present

## 2016-10-18 DIAGNOSIS — I10 Essential (primary) hypertension: Secondary | ICD-10-CM | POA: Diagnosis not present

## 2016-10-19 DIAGNOSIS — K592 Neurogenic bowel, not elsewhere classified: Secondary | ICD-10-CM | POA: Diagnosis not present

## 2016-10-19 DIAGNOSIS — E119 Type 2 diabetes mellitus without complications: Secondary | ICD-10-CM | POA: Diagnosis not present

## 2016-10-19 DIAGNOSIS — N319 Neuromuscular dysfunction of bladder, unspecified: Secondary | ICD-10-CM | POA: Diagnosis not present

## 2016-10-19 DIAGNOSIS — G35 Multiple sclerosis: Secondary | ICD-10-CM | POA: Diagnosis not present

## 2016-10-19 DIAGNOSIS — I4891 Unspecified atrial fibrillation: Secondary | ICD-10-CM | POA: Diagnosis not present

## 2016-10-19 DIAGNOSIS — I10 Essential (primary) hypertension: Secondary | ICD-10-CM | POA: Diagnosis not present

## 2016-10-20 DIAGNOSIS — I4891 Unspecified atrial fibrillation: Secondary | ICD-10-CM | POA: Diagnosis not present

## 2016-10-20 DIAGNOSIS — I10 Essential (primary) hypertension: Secondary | ICD-10-CM | POA: Diagnosis not present

## 2016-10-20 DIAGNOSIS — K592 Neurogenic bowel, not elsewhere classified: Secondary | ICD-10-CM | POA: Diagnosis not present

## 2016-10-20 DIAGNOSIS — E119 Type 2 diabetes mellitus without complications: Secondary | ICD-10-CM | POA: Diagnosis not present

## 2016-10-20 DIAGNOSIS — N319 Neuromuscular dysfunction of bladder, unspecified: Secondary | ICD-10-CM | POA: Diagnosis not present

## 2016-10-20 DIAGNOSIS — G35 Multiple sclerosis: Secondary | ICD-10-CM | POA: Diagnosis not present

## 2016-10-23 ENCOUNTER — Telehealth: Payer: Self-pay | Admitting: Internal Medicine

## 2016-10-23 DIAGNOSIS — N319 Neuromuscular dysfunction of bladder, unspecified: Secondary | ICD-10-CM | POA: Diagnosis not present

## 2016-10-23 DIAGNOSIS — I4891 Unspecified atrial fibrillation: Secondary | ICD-10-CM | POA: Diagnosis not present

## 2016-10-23 DIAGNOSIS — G35 Multiple sclerosis: Secondary | ICD-10-CM | POA: Diagnosis not present

## 2016-10-23 DIAGNOSIS — K592 Neurogenic bowel, not elsewhere classified: Secondary | ICD-10-CM | POA: Diagnosis not present

## 2016-10-23 DIAGNOSIS — E119 Type 2 diabetes mellitus without complications: Secondary | ICD-10-CM | POA: Diagnosis not present

## 2016-10-23 DIAGNOSIS — I10 Essential (primary) hypertension: Secondary | ICD-10-CM | POA: Diagnosis not present

## 2016-10-23 NOTE — Telephone Encounter (Signed)
Faxed signed orders r/t PNT/INR & warfarin dosing to [facility]

## 2016-10-25 DIAGNOSIS — E119 Type 2 diabetes mellitus without complications: Secondary | ICD-10-CM | POA: Diagnosis not present

## 2016-10-25 DIAGNOSIS — G35 Multiple sclerosis: Secondary | ICD-10-CM | POA: Diagnosis not present

## 2016-10-25 DIAGNOSIS — I4891 Unspecified atrial fibrillation: Secondary | ICD-10-CM | POA: Diagnosis not present

## 2016-10-25 DIAGNOSIS — K592 Neurogenic bowel, not elsewhere classified: Secondary | ICD-10-CM | POA: Diagnosis not present

## 2016-10-25 DIAGNOSIS — I10 Essential (primary) hypertension: Secondary | ICD-10-CM | POA: Diagnosis not present

## 2016-10-25 DIAGNOSIS — N319 Neuromuscular dysfunction of bladder, unspecified: Secondary | ICD-10-CM | POA: Diagnosis not present

## 2016-10-26 ENCOUNTER — Telehealth: Payer: Self-pay | Admitting: Neurology

## 2016-10-26 DIAGNOSIS — K592 Neurogenic bowel, not elsewhere classified: Secondary | ICD-10-CM | POA: Diagnosis not present

## 2016-10-26 DIAGNOSIS — E119 Type 2 diabetes mellitus without complications: Secondary | ICD-10-CM | POA: Diagnosis not present

## 2016-10-26 DIAGNOSIS — I10 Essential (primary) hypertension: Secondary | ICD-10-CM | POA: Diagnosis not present

## 2016-10-26 DIAGNOSIS — I4891 Unspecified atrial fibrillation: Secondary | ICD-10-CM | POA: Diagnosis not present

## 2016-10-26 DIAGNOSIS — G35 Multiple sclerosis: Secondary | ICD-10-CM | POA: Diagnosis not present

## 2016-10-26 DIAGNOSIS — N319 Neuromuscular dysfunction of bladder, unspecified: Secondary | ICD-10-CM | POA: Diagnosis not present

## 2016-10-26 NOTE — Telephone Encounter (Signed)
Patient already r/s for 11/29/16 at 12pm, thank you

## 2016-10-26 NOTE — Telephone Encounter (Signed)
Called pt back. Scheduled appt for 11/29/16 at 12pm instead. Pt is agreeable to this.

## 2016-10-26 NOTE — Telephone Encounter (Signed)
Pt has 4/12 Baclofen refill appt which needs r/s since Willis will be gone that day. When should new appt be scheduled fo?

## 2016-10-26 NOTE — Telephone Encounter (Signed)
Okay to work in this patient any time before 12/04/2016. This is the alarm date for her pump.

## 2016-10-27 ENCOUNTER — Telehealth: Payer: Self-pay | Admitting: Internal Medicine

## 2016-10-27 NOTE — Telephone Encounter (Signed)
New message    Pt is calling because she has enough 3mg  pills until Tuesday. She states [name] told her to call so they could change her mg.

## 2016-10-27 NOTE — Telephone Encounter (Signed)
Patient will run out of warfarin on Tuesday.  Was given 3 mg tabs after d/c from rehab, but has 5 mg tabs at home.  Advised that she take 2.5 mg each Mon/Wed/Fri and 5 mg all other days.  Is due for INR check on Tuesday.  Will be sure to follow weekly for 2-3 weeks as we adjust dose.  Patient voiced understanding

## 2016-10-31 ENCOUNTER — Ambulatory Visit (INDEPENDENT_AMBULATORY_CARE_PROVIDER_SITE_OTHER): Payer: Medicare Other | Admitting: Pharmacist Clinician (PhC)/ Clinical Pharmacy Specialist

## 2016-10-31 DIAGNOSIS — N319 Neuromuscular dysfunction of bladder, unspecified: Secondary | ICD-10-CM | POA: Diagnosis not present

## 2016-10-31 DIAGNOSIS — I4891 Unspecified atrial fibrillation: Secondary | ICD-10-CM

## 2016-10-31 DIAGNOSIS — G35 Multiple sclerosis: Secondary | ICD-10-CM | POA: Diagnosis not present

## 2016-10-31 DIAGNOSIS — I10 Essential (primary) hypertension: Secondary | ICD-10-CM | POA: Diagnosis not present

## 2016-10-31 DIAGNOSIS — E119 Type 2 diabetes mellitus without complications: Secondary | ICD-10-CM | POA: Diagnosis not present

## 2016-10-31 DIAGNOSIS — K592 Neurogenic bowel, not elsewhere classified: Secondary | ICD-10-CM | POA: Diagnosis not present

## 2016-10-31 LAB — POCT INR: INR: 2.4

## 2016-11-03 DIAGNOSIS — E119 Type 2 diabetes mellitus without complications: Secondary | ICD-10-CM | POA: Diagnosis not present

## 2016-11-03 DIAGNOSIS — I10 Essential (primary) hypertension: Secondary | ICD-10-CM | POA: Diagnosis not present

## 2016-11-03 DIAGNOSIS — N319 Neuromuscular dysfunction of bladder, unspecified: Secondary | ICD-10-CM | POA: Diagnosis not present

## 2016-11-03 DIAGNOSIS — G35 Multiple sclerosis: Secondary | ICD-10-CM | POA: Diagnosis not present

## 2016-11-03 DIAGNOSIS — I4891 Unspecified atrial fibrillation: Secondary | ICD-10-CM | POA: Diagnosis not present

## 2016-11-03 DIAGNOSIS — K592 Neurogenic bowel, not elsewhere classified: Secondary | ICD-10-CM | POA: Diagnosis not present

## 2016-11-06 DIAGNOSIS — I10 Essential (primary) hypertension: Secondary | ICD-10-CM | POA: Diagnosis not present

## 2016-11-06 DIAGNOSIS — E119 Type 2 diabetes mellitus without complications: Secondary | ICD-10-CM | POA: Diagnosis not present

## 2016-11-06 DIAGNOSIS — K592 Neurogenic bowel, not elsewhere classified: Secondary | ICD-10-CM | POA: Diagnosis not present

## 2016-11-06 DIAGNOSIS — G35 Multiple sclerosis: Secondary | ICD-10-CM | POA: Diagnosis not present

## 2016-11-06 DIAGNOSIS — I4891 Unspecified atrial fibrillation: Secondary | ICD-10-CM | POA: Diagnosis not present

## 2016-11-06 DIAGNOSIS — N319 Neuromuscular dysfunction of bladder, unspecified: Secondary | ICD-10-CM | POA: Diagnosis not present

## 2016-11-07 DIAGNOSIS — K592 Neurogenic bowel, not elsewhere classified: Secondary | ICD-10-CM | POA: Diagnosis not present

## 2016-11-07 DIAGNOSIS — I4891 Unspecified atrial fibrillation: Secondary | ICD-10-CM | POA: Diagnosis not present

## 2016-11-07 DIAGNOSIS — E119 Type 2 diabetes mellitus without complications: Secondary | ICD-10-CM | POA: Diagnosis not present

## 2016-11-07 DIAGNOSIS — I10 Essential (primary) hypertension: Secondary | ICD-10-CM | POA: Diagnosis not present

## 2016-11-07 DIAGNOSIS — N319 Neuromuscular dysfunction of bladder, unspecified: Secondary | ICD-10-CM | POA: Diagnosis not present

## 2016-11-07 DIAGNOSIS — G35 Multiple sclerosis: Secondary | ICD-10-CM | POA: Diagnosis not present

## 2016-11-09 ENCOUNTER — Telehealth: Payer: Self-pay | Admitting: Internal Medicine

## 2016-11-09 DIAGNOSIS — I4891 Unspecified atrial fibrillation: Secondary | ICD-10-CM | POA: Diagnosis not present

## 2016-11-09 DIAGNOSIS — N319 Neuromuscular dysfunction of bladder, unspecified: Secondary | ICD-10-CM | POA: Diagnosis not present

## 2016-11-09 DIAGNOSIS — K592 Neurogenic bowel, not elsewhere classified: Secondary | ICD-10-CM | POA: Diagnosis not present

## 2016-11-09 DIAGNOSIS — E119 Type 2 diabetes mellitus without complications: Secondary | ICD-10-CM | POA: Diagnosis not present

## 2016-11-09 DIAGNOSIS — G35 Multiple sclerosis: Secondary | ICD-10-CM | POA: Diagnosis not present

## 2016-11-09 DIAGNOSIS — I10 Essential (primary) hypertension: Secondary | ICD-10-CM | POA: Diagnosis not present

## 2016-11-09 NOTE — Telephone Encounter (Signed)
Faxed signed orders r/t warfarin/INR, VS + pulse ox @ every visit, etc

## 2016-11-10 DIAGNOSIS — I10 Essential (primary) hypertension: Secondary | ICD-10-CM | POA: Diagnosis not present

## 2016-11-10 DIAGNOSIS — E119 Type 2 diabetes mellitus without complications: Secondary | ICD-10-CM | POA: Diagnosis not present

## 2016-11-10 DIAGNOSIS — K592 Neurogenic bowel, not elsewhere classified: Secondary | ICD-10-CM | POA: Diagnosis not present

## 2016-11-10 DIAGNOSIS — N319 Neuromuscular dysfunction of bladder, unspecified: Secondary | ICD-10-CM | POA: Diagnosis not present

## 2016-11-10 DIAGNOSIS — G35 Multiple sclerosis: Secondary | ICD-10-CM | POA: Diagnosis not present

## 2016-11-10 DIAGNOSIS — I4891 Unspecified atrial fibrillation: Secondary | ICD-10-CM | POA: Diagnosis not present

## 2016-11-14 ENCOUNTER — Ambulatory Visit (INDEPENDENT_AMBULATORY_CARE_PROVIDER_SITE_OTHER): Payer: Medicare Other | Admitting: Pharmacist

## 2016-11-14 DIAGNOSIS — I10 Essential (primary) hypertension: Secondary | ICD-10-CM | POA: Diagnosis not present

## 2016-11-14 DIAGNOSIS — K592 Neurogenic bowel, not elsewhere classified: Secondary | ICD-10-CM | POA: Diagnosis not present

## 2016-11-14 DIAGNOSIS — N319 Neuromuscular dysfunction of bladder, unspecified: Secondary | ICD-10-CM | POA: Diagnosis not present

## 2016-11-14 DIAGNOSIS — G35 Multiple sclerosis: Secondary | ICD-10-CM | POA: Diagnosis not present

## 2016-11-14 DIAGNOSIS — I4891 Unspecified atrial fibrillation: Secondary | ICD-10-CM | POA: Diagnosis not present

## 2016-11-14 DIAGNOSIS — E119 Type 2 diabetes mellitus without complications: Secondary | ICD-10-CM | POA: Diagnosis not present

## 2016-11-14 LAB — PROTIME-INR: INR: 3.2 — AB (ref <=1.1)

## 2016-11-15 ENCOUNTER — Other Ambulatory Visit: Payer: Self-pay | Admitting: Internal Medicine

## 2016-11-16 ENCOUNTER — Telehealth: Payer: Self-pay | Admitting: Neurology

## 2016-11-16 DIAGNOSIS — N319 Neuromuscular dysfunction of bladder, unspecified: Secondary | ICD-10-CM | POA: Diagnosis not present

## 2016-11-16 DIAGNOSIS — K592 Neurogenic bowel, not elsewhere classified: Secondary | ICD-10-CM | POA: Diagnosis not present

## 2016-11-16 DIAGNOSIS — G35 Multiple sclerosis: Secondary | ICD-10-CM | POA: Diagnosis not present

## 2016-11-16 DIAGNOSIS — I10 Essential (primary) hypertension: Secondary | ICD-10-CM | POA: Diagnosis not present

## 2016-11-16 DIAGNOSIS — I4891 Unspecified atrial fibrillation: Secondary | ICD-10-CM | POA: Diagnosis not present

## 2016-11-16 DIAGNOSIS — E119 Type 2 diabetes mellitus without complications: Secondary | ICD-10-CM | POA: Diagnosis not present

## 2016-11-16 NOTE — Telephone Encounter (Signed)
Rosalita Levan phyysical therapist with [facility] is calling for the patient. She says the patient is experiencing pain around her pump x1 week and pain is getting worse. She has an appointment for pump refill on 11-29-16 but needs to be seen sooner. Please call the patient and discuss.

## 2016-11-16 NOTE — Telephone Encounter (Signed)
I called the patient. The patient has a problem with what she claims is swelling and distention of the stomach, she has had this for least 2-1/2 or 3 years, CT of the abdomen was done 2-1/2 years ago looking for the same problem, no evidence of ascites was seen.  This potentially could be a sensation associated with multiple sclerosis, the patient does not believe that it is a muscle spasm. I will see the patient and evaluate her when I do the baclofen pump replacement.

## 2016-11-16 NOTE — Telephone Encounter (Signed)
Dr Willis- please advise 

## 2016-11-17 DIAGNOSIS — I10 Essential (primary) hypertension: Secondary | ICD-10-CM | POA: Diagnosis not present

## 2016-11-17 DIAGNOSIS — I4891 Unspecified atrial fibrillation: Secondary | ICD-10-CM | POA: Diagnosis not present

## 2016-11-17 DIAGNOSIS — N319 Neuromuscular dysfunction of bladder, unspecified: Secondary | ICD-10-CM | POA: Diagnosis not present

## 2016-11-17 DIAGNOSIS — E119 Type 2 diabetes mellitus without complications: Secondary | ICD-10-CM | POA: Diagnosis not present

## 2016-11-17 DIAGNOSIS — G35 Multiple sclerosis: Secondary | ICD-10-CM | POA: Diagnosis not present

## 2016-11-17 DIAGNOSIS — K592 Neurogenic bowel, not elsewhere classified: Secondary | ICD-10-CM | POA: Diagnosis not present

## 2016-11-20 DIAGNOSIS — I4891 Unspecified atrial fibrillation: Secondary | ICD-10-CM | POA: Diagnosis not present

## 2016-11-20 DIAGNOSIS — K592 Neurogenic bowel, not elsewhere classified: Secondary | ICD-10-CM | POA: Diagnosis not present

## 2016-11-20 DIAGNOSIS — I10 Essential (primary) hypertension: Secondary | ICD-10-CM | POA: Diagnosis not present

## 2016-11-20 DIAGNOSIS — G35 Multiple sclerosis: Secondary | ICD-10-CM | POA: Diagnosis not present

## 2016-11-20 DIAGNOSIS — E119 Type 2 diabetes mellitus without complications: Secondary | ICD-10-CM | POA: Diagnosis not present

## 2016-11-20 DIAGNOSIS — N319 Neuromuscular dysfunction of bladder, unspecified: Secondary | ICD-10-CM | POA: Diagnosis not present

## 2016-11-21 DIAGNOSIS — G35 Multiple sclerosis: Secondary | ICD-10-CM | POA: Diagnosis not present

## 2016-11-21 DIAGNOSIS — E119 Type 2 diabetes mellitus without complications: Secondary | ICD-10-CM | POA: Diagnosis not present

## 2016-11-21 DIAGNOSIS — N319 Neuromuscular dysfunction of bladder, unspecified: Secondary | ICD-10-CM | POA: Diagnosis not present

## 2016-11-21 DIAGNOSIS — I10 Essential (primary) hypertension: Secondary | ICD-10-CM | POA: Diagnosis not present

## 2016-11-21 DIAGNOSIS — K592 Neurogenic bowel, not elsewhere classified: Secondary | ICD-10-CM | POA: Diagnosis not present

## 2016-11-21 DIAGNOSIS — I4891 Unspecified atrial fibrillation: Secondary | ICD-10-CM | POA: Diagnosis not present

## 2016-11-23 ENCOUNTER — Telehealth: Payer: Self-pay | Admitting: Internal Medicine

## 2016-11-23 ENCOUNTER — Ambulatory Visit (INDEPENDENT_AMBULATORY_CARE_PROVIDER_SITE_OTHER): Payer: Medicare Other | Admitting: Pharmacist Clinician (PhC)/ Clinical Pharmacy Specialist

## 2016-11-23 DIAGNOSIS — G35 Multiple sclerosis: Secondary | ICD-10-CM | POA: Diagnosis not present

## 2016-11-23 DIAGNOSIS — E119 Type 2 diabetes mellitus without complications: Secondary | ICD-10-CM | POA: Diagnosis not present

## 2016-11-23 DIAGNOSIS — I4891 Unspecified atrial fibrillation: Secondary | ICD-10-CM | POA: Diagnosis not present

## 2016-11-23 DIAGNOSIS — N319 Neuromuscular dysfunction of bladder, unspecified: Secondary | ICD-10-CM | POA: Diagnosis not present

## 2016-11-23 DIAGNOSIS — I10 Essential (primary) hypertension: Secondary | ICD-10-CM | POA: Diagnosis not present

## 2016-11-23 DIAGNOSIS — K592 Neurogenic bowel, not elsewhere classified: Secondary | ICD-10-CM | POA: Diagnosis not present

## 2016-11-23 LAB — POCT INR: INR: 2.5

## 2016-11-23 NOTE — Telephone Encounter (Signed)
Faxed signed orders r/t PT/INR and warfarin dosing to [facility]  

## 2016-11-24 DIAGNOSIS — K592 Neurogenic bowel, not elsewhere classified: Secondary | ICD-10-CM | POA: Diagnosis not present

## 2016-11-24 DIAGNOSIS — I10 Essential (primary) hypertension: Secondary | ICD-10-CM | POA: Diagnosis not present

## 2016-11-24 DIAGNOSIS — E119 Type 2 diabetes mellitus without complications: Secondary | ICD-10-CM | POA: Diagnosis not present

## 2016-11-24 DIAGNOSIS — I4891 Unspecified atrial fibrillation: Secondary | ICD-10-CM | POA: Diagnosis not present

## 2016-11-24 DIAGNOSIS — G35 Multiple sclerosis: Secondary | ICD-10-CM | POA: Diagnosis not present

## 2016-11-24 DIAGNOSIS — N319 Neuromuscular dysfunction of bladder, unspecified: Secondary | ICD-10-CM | POA: Diagnosis not present

## 2016-11-27 ENCOUNTER — Telehealth: Payer: Self-pay | Admitting: Internal Medicine

## 2016-11-27 DIAGNOSIS — G35 Multiple sclerosis: Secondary | ICD-10-CM | POA: Diagnosis not present

## 2016-11-27 DIAGNOSIS — N319 Neuromuscular dysfunction of bladder, unspecified: Secondary | ICD-10-CM | POA: Diagnosis not present

## 2016-11-27 DIAGNOSIS — I10 Essential (primary) hypertension: Secondary | ICD-10-CM | POA: Diagnosis not present

## 2016-11-27 DIAGNOSIS — E119 Type 2 diabetes mellitus without complications: Secondary | ICD-10-CM | POA: Diagnosis not present

## 2016-11-27 DIAGNOSIS — I4891 Unspecified atrial fibrillation: Secondary | ICD-10-CM | POA: Diagnosis not present

## 2016-11-27 DIAGNOSIS — K592 Neurogenic bowel, not elsewhere classified: Secondary | ICD-10-CM | POA: Diagnosis not present

## 2016-11-27 NOTE — Telephone Encounter (Signed)
Faxed signed orders r/t PT/INR and warfarin dosing

## 2016-11-29 ENCOUNTER — Telehealth: Payer: Self-pay | Admitting: *Deleted

## 2016-11-29 ENCOUNTER — Ambulatory Visit (INDEPENDENT_AMBULATORY_CARE_PROVIDER_SITE_OTHER): Payer: Medicare Other | Admitting: Neurology

## 2016-11-29 ENCOUNTER — Encounter: Payer: Self-pay | Admitting: Neurology

## 2016-11-29 VITALS — BP 147/84 | HR 84 | Ht 63.0 in

## 2016-11-29 DIAGNOSIS — M62838 Other muscle spasm: Secondary | ICD-10-CM | POA: Diagnosis not present

## 2016-11-29 DIAGNOSIS — G35 Multiple sclerosis: Secondary | ICD-10-CM | POA: Diagnosis not present

## 2016-11-29 MED ORDER — BACLOFEN 40 MG/20ML IT SOLN
80.0000 mg | Freq: Once | INTRATHECAL | Status: AC
  Administered 2016-11-29: 80 mg via INTRATHECAL

## 2016-11-29 NOTE — Progress Notes (Signed)
Please refer to baclofen procedure note. 

## 2016-11-29 NOTE — Telephone Encounter (Signed)
Called patient. Scheduled next baclofen pump refill appt on 03/13/17 at 12pm, check in 1145am. Pt verbalized understanding. Agreeable to this date and time.

## 2016-11-29 NOTE — Procedures (Signed)
      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 The patient has a baclofen pump in place, she comes back for a baclofen pump refill. She indicates that her spasticity level is stable, she does not desire any changes in the rate.  Baclofen pump refill note  The baclofen pump site was cleaned with Betadine solution. A 21-gauge needle was inserted into the pump port site. Approximately 6.5 cc of residual baclofen was removed, the pump indicates a 3.6 cc residual. 40 cc of replacement baclofen was placed into the pump at 2000 mcg/cc concentration.  The pump was reprogrammed for the following settings: The patient was maintained at a basal rate of 28.0 g per hour. From 1 AM to 5 AM, the patient gets 139.9 g, rate of 28.0 mcg/h. From 3 PM untill midnight the patient gets 150.2 g, a rate of 30.0 mcg/h. The patient is getting a total of 701.9 g per day.  The alarm volume is set at 1.5 cc. The next alarm date is 03/18/2017.  The patient tolerated the procedure well. There were no complications of the above procedure.  The [place] number is [id number] The baclofen expiration date is May 2020. The baclofen lot number is [id number].

## 2016-11-30 ENCOUNTER — Ambulatory Visit: Payer: Self-pay | Admitting: Neurology

## 2016-11-30 DIAGNOSIS — K592 Neurogenic bowel, not elsewhere classified: Secondary | ICD-10-CM | POA: Diagnosis not present

## 2016-11-30 DIAGNOSIS — E119 Type 2 diabetes mellitus without complications: Secondary | ICD-10-CM | POA: Diagnosis not present

## 2016-11-30 DIAGNOSIS — F419 Anxiety disorder, unspecified: Secondary | ICD-10-CM | POA: Diagnosis not present

## 2016-11-30 DIAGNOSIS — E785 Hyperlipidemia, unspecified: Secondary | ICD-10-CM | POA: Diagnosis not present

## 2016-11-30 DIAGNOSIS — Z5181 Encounter for therapeutic drug level monitoring: Secondary | ICD-10-CM | POA: Diagnosis not present

## 2016-11-30 DIAGNOSIS — G4733 Obstructive sleep apnea (adult) (pediatric): Secondary | ICD-10-CM | POA: Diagnosis not present

## 2016-11-30 DIAGNOSIS — Z8701 Personal history of pneumonia (recurrent): Secondary | ICD-10-CM | POA: Diagnosis not present

## 2016-11-30 DIAGNOSIS — I10 Essential (primary) hypertension: Secondary | ICD-10-CM | POA: Diagnosis not present

## 2016-11-30 DIAGNOSIS — F329 Major depressive disorder, single episode, unspecified: Secondary | ICD-10-CM | POA: Diagnosis not present

## 2016-11-30 DIAGNOSIS — I4891 Unspecified atrial fibrillation: Secondary | ICD-10-CM | POA: Diagnosis not present

## 2016-11-30 DIAGNOSIS — I872 Venous insufficiency (chronic) (peripheral): Secondary | ICD-10-CM | POA: Diagnosis not present

## 2016-11-30 DIAGNOSIS — E039 Hypothyroidism, unspecified: Secondary | ICD-10-CM | POA: Diagnosis not present

## 2016-11-30 DIAGNOSIS — Z7901 Long term (current) use of anticoagulants: Secondary | ICD-10-CM | POA: Diagnosis not present

## 2016-11-30 DIAGNOSIS — K219 Gastro-esophageal reflux disease without esophagitis: Secondary | ICD-10-CM | POA: Diagnosis not present

## 2016-11-30 DIAGNOSIS — N319 Neuromuscular dysfunction of bladder, unspecified: Secondary | ICD-10-CM | POA: Diagnosis not present

## 2016-11-30 DIAGNOSIS — G35 Multiple sclerosis: Secondary | ICD-10-CM | POA: Diagnosis not present

## 2016-12-01 DIAGNOSIS — K592 Neurogenic bowel, not elsewhere classified: Secondary | ICD-10-CM | POA: Diagnosis not present

## 2016-12-01 DIAGNOSIS — I4891 Unspecified atrial fibrillation: Secondary | ICD-10-CM | POA: Diagnosis not present

## 2016-12-01 DIAGNOSIS — N319 Neuromuscular dysfunction of bladder, unspecified: Secondary | ICD-10-CM | POA: Diagnosis not present

## 2016-12-01 DIAGNOSIS — E119 Type 2 diabetes mellitus without complications: Secondary | ICD-10-CM | POA: Diagnosis not present

## 2016-12-01 DIAGNOSIS — G35 Multiple sclerosis: Secondary | ICD-10-CM | POA: Diagnosis not present

## 2016-12-01 DIAGNOSIS — I10 Essential (primary) hypertension: Secondary | ICD-10-CM | POA: Diagnosis not present

## 2016-12-04 DIAGNOSIS — G35 Multiple sclerosis: Secondary | ICD-10-CM | POA: Diagnosis not present

## 2016-12-04 DIAGNOSIS — I4891 Unspecified atrial fibrillation: Secondary | ICD-10-CM | POA: Diagnosis not present

## 2016-12-04 DIAGNOSIS — K592 Neurogenic bowel, not elsewhere classified: Secondary | ICD-10-CM | POA: Diagnosis not present

## 2016-12-04 DIAGNOSIS — E119 Type 2 diabetes mellitus without complications: Secondary | ICD-10-CM | POA: Diagnosis not present

## 2016-12-04 DIAGNOSIS — I10 Essential (primary) hypertension: Secondary | ICD-10-CM | POA: Diagnosis not present

## 2016-12-04 DIAGNOSIS — N319 Neuromuscular dysfunction of bladder, unspecified: Secondary | ICD-10-CM | POA: Diagnosis not present

## 2016-12-05 DIAGNOSIS — E119 Type 2 diabetes mellitus without complications: Secondary | ICD-10-CM | POA: Diagnosis not present

## 2016-12-05 DIAGNOSIS — K592 Neurogenic bowel, not elsewhere classified: Secondary | ICD-10-CM | POA: Diagnosis not present

## 2016-12-05 DIAGNOSIS — N319 Neuromuscular dysfunction of bladder, unspecified: Secondary | ICD-10-CM | POA: Diagnosis not present

## 2016-12-05 DIAGNOSIS — I4891 Unspecified atrial fibrillation: Secondary | ICD-10-CM | POA: Diagnosis not present

## 2016-12-05 DIAGNOSIS — G35 Multiple sclerosis: Secondary | ICD-10-CM | POA: Diagnosis not present

## 2016-12-05 DIAGNOSIS — I10 Essential (primary) hypertension: Secondary | ICD-10-CM | POA: Diagnosis not present

## 2016-12-07 ENCOUNTER — Telehealth: Payer: Self-pay | Admitting: Neurology

## 2016-12-07 DIAGNOSIS — G35 Multiple sclerosis: Secondary | ICD-10-CM | POA: Diagnosis not present

## 2016-12-07 DIAGNOSIS — E119 Type 2 diabetes mellitus without complications: Secondary | ICD-10-CM | POA: Diagnosis not present

## 2016-12-07 DIAGNOSIS — N319 Neuromuscular dysfunction of bladder, unspecified: Secondary | ICD-10-CM | POA: Diagnosis not present

## 2016-12-07 DIAGNOSIS — K592 Neurogenic bowel, not elsewhere classified: Secondary | ICD-10-CM | POA: Diagnosis not present

## 2016-12-07 DIAGNOSIS — I10 Essential (primary) hypertension: Secondary | ICD-10-CM | POA: Diagnosis not present

## 2016-12-07 DIAGNOSIS — I4891 Unspecified atrial fibrillation: Secondary | ICD-10-CM | POA: Diagnosis not present

## 2016-12-07 NOTE — Telephone Encounter (Signed)
Pt said that re: her reports from [facility] 2 reports have been ordered re: her sleep difference between the 2 mask that she has gone through and to see how much better she is doing with the current mask. She wanted Megan to be aware of the 2 reports for her upcoming appointment.

## 2016-12-08 DIAGNOSIS — I4891 Unspecified atrial fibrillation: Secondary | ICD-10-CM | POA: Diagnosis not present

## 2016-12-08 DIAGNOSIS — I10 Essential (primary) hypertension: Secondary | ICD-10-CM | POA: Diagnosis not present

## 2016-12-08 DIAGNOSIS — E119 Type 2 diabetes mellitus without complications: Secondary | ICD-10-CM | POA: Diagnosis not present

## 2016-12-08 DIAGNOSIS — G35 Multiple sclerosis: Secondary | ICD-10-CM | POA: Diagnosis not present

## 2016-12-08 DIAGNOSIS — N319 Neuromuscular dysfunction of bladder, unspecified: Secondary | ICD-10-CM | POA: Diagnosis not present

## 2016-12-08 DIAGNOSIS — K592 Neurogenic bowel, not elsewhere classified: Secondary | ICD-10-CM | POA: Diagnosis not present

## 2016-12-11 ENCOUNTER — Telehealth: Payer: Self-pay | Admitting: *Deleted

## 2016-12-11 ENCOUNTER — Ambulatory Visit (INDEPENDENT_AMBULATORY_CARE_PROVIDER_SITE_OTHER): Payer: Medicare Other | Admitting: Pharmacist Clinician (PhC)/ Clinical Pharmacy Specialist

## 2016-12-11 DIAGNOSIS — N319 Neuromuscular dysfunction of bladder, unspecified: Secondary | ICD-10-CM | POA: Diagnosis not present

## 2016-12-11 DIAGNOSIS — I10 Essential (primary) hypertension: Secondary | ICD-10-CM | POA: Diagnosis not present

## 2016-12-11 DIAGNOSIS — I4891 Unspecified atrial fibrillation: Secondary | ICD-10-CM | POA: Diagnosis not present

## 2016-12-11 DIAGNOSIS — E119 Type 2 diabetes mellitus without complications: Secondary | ICD-10-CM | POA: Diagnosis not present

## 2016-12-11 DIAGNOSIS — Z7901 Long term (current) use of anticoagulants: Secondary | ICD-10-CM

## 2016-12-11 DIAGNOSIS — K592 Neurogenic bowel, not elsewhere classified: Secondary | ICD-10-CM | POA: Diagnosis not present

## 2016-12-11 DIAGNOSIS — G35 Multiple sclerosis: Secondary | ICD-10-CM | POA: Diagnosis not present

## 2016-12-11 LAB — POCT INR: INR: 3.7

## 2016-12-11 NOTE — Telephone Encounter (Signed)
Attempted to reach Mrs. Bartelson again. Unable to reach her by telephone.  Left voice message that Thea Silversmith RN would call her tomorrow to follow up.

## 2016-12-11 NOTE — Telephone Encounter (Signed)
Was not able to speak directly to Toni [name]. Was returning her earlier voice message regarding leg pain.  Mrs. Nilsen was last seen by Dr. Donnetta Hutching on 11-02-2015.  Left voice message asking her to call [facility].

## 2016-12-12 ENCOUNTER — Telehealth: Payer: Self-pay | Admitting: *Deleted

## 2016-12-12 ENCOUNTER — Ambulatory Visit (INDEPENDENT_AMBULATORY_CARE_PROVIDER_SITE_OTHER): Payer: Medicare Other | Admitting: Adult Health

## 2016-12-12 ENCOUNTER — Encounter: Payer: Self-pay | Admitting: Adult Health

## 2016-12-12 ENCOUNTER — Other Ambulatory Visit: Payer: Self-pay | Admitting: Neurology

## 2016-12-12 ENCOUNTER — Encounter (INDEPENDENT_AMBULATORY_CARE_PROVIDER_SITE_OTHER): Payer: Self-pay

## 2016-12-12 ENCOUNTER — Telehealth: Payer: Self-pay | Admitting: Adult Health

## 2016-12-12 VITALS — BP 120/72 | HR 66 | Resp 18 | Ht 63.0 in | Wt 265.0 lb

## 2016-12-12 DIAGNOSIS — R300 Dysuria: Secondary | ICD-10-CM | POA: Diagnosis not present

## 2016-12-12 DIAGNOSIS — G35 Multiple sclerosis: Secondary | ICD-10-CM

## 2016-12-12 DIAGNOSIS — I10 Essential (primary) hypertension: Secondary | ICD-10-CM | POA: Diagnosis not present

## 2016-12-12 DIAGNOSIS — G609 Hereditary and idiopathic neuropathy, unspecified: Secondary | ICD-10-CM

## 2016-12-12 DIAGNOSIS — I4891 Unspecified atrial fibrillation: Secondary | ICD-10-CM | POA: Diagnosis not present

## 2016-12-12 DIAGNOSIS — E119 Type 2 diabetes mellitus without complications: Secondary | ICD-10-CM | POA: Diagnosis not present

## 2016-12-12 DIAGNOSIS — G4733 Obstructive sleep apnea (adult) (pediatric): Secondary | ICD-10-CM | POA: Diagnosis not present

## 2016-12-12 DIAGNOSIS — K592 Neurogenic bowel, not elsewhere classified: Secondary | ICD-10-CM | POA: Diagnosis not present

## 2016-12-12 DIAGNOSIS — N319 Neuromuscular dysfunction of bladder, unspecified: Secondary | ICD-10-CM | POA: Diagnosis not present

## 2016-12-12 DIAGNOSIS — Z5181 Encounter for therapeutic drug level monitoring: Secondary | ICD-10-CM

## 2016-12-12 NOTE — Telephone Encounter (Signed)
I have spoken with pt. an resolved issue/fim

## 2016-12-12 NOTE — Progress Notes (Signed)
I have read the note, and I agree with the clinical assessment and plan.  WILLIS,CHARLES KEITH   

## 2016-12-12 NOTE — Telephone Encounter (Signed)
Pt called in c/o leg pain. Not having any foot or ankle swelling. This is not a new symptom for her. She just wanted to let us know, as she said. I suggested continued use of stockings, elevation and Tylenol since she can not take Ibuprofen since she is on a blood thinner. She agreed to this plan. Will follow prn.

## 2016-12-12 NOTE — Telephone Encounter (Signed)
Pt said on her appointment summary under what's next re: her baclofen (LIORESAL) 20 MG tablet  Was filled 08-17-2016, it is not over due, she would like a revised print to reflect that it has been done and is not overdue

## 2016-12-12 NOTE — Patient Instructions (Addendum)
Continue Betaseron Blood work to be completed at PCP office- CBC and CMP Will discuss with Dr. Brett Fairy about bipap If your symptoms worsen or you develop new symptoms please let us know.   [place] [facility]- Dr. Dennard Nip [phone]

## 2016-12-12 NOTE — Progress Notes (Addendum)
PATIENT: Toni [name] DOB: [birth year]  REASON FOR VISIT: follow up- multiple sclerosis, obstructive sleep apnea on CPAP HISTORY FROM: patient  HISTORY OF PRESENT ILLNESS: Ms. Strength is a 71 year old female with a history of multiple sclerosis and obstructive sleep apnea on CPAP. She returns today for follow-up. She denies any new numbness or weakness. He has any changes with her gait or balance. Denies any changes with her bowels or bladder. No change with her vision. She states that she has been on Betaseron for approximately 3 months. She states that she does feel "yucky" after using Betaseron. Her BiPAP download indicates that she uses her machine every night. She uses her machine greater than 4 hours 28 out of 30 days for compliance of 93%. On average she uses her machine 8 hours and 13 minutes. She is on an inspiratory pressure of 20 cm of water and an expiratory pressure of 15 cm of water. She does not have a leak. She is using a full face mask. Her residual AHI is 34 with 9 of those being central apneas. She returns today for an evaluation.     REVIEW OF SYSTEMS: Out of a complete 14 system review of symptoms, the patient complains only of the following symptoms, and all other reviewed systems are negative.  Leg swelling, palpitations, apnea, frequent waking, constipation, swollen abdomen, difficulty urinating, joint pain, depression, anxious, numbness, weakness, environmental allergies  ALLERGIES: Allergies  Allergen Reactions  . Hydrocodone Nausea And Vomiting    Hydrocodone causes vomiting  . Oxycodone Nausea And Vomiting    Oral oxycodone causes vomiting  . Chlorhexidine Gluconate Rash    Sores at site  . Zoloft [Sertraline] Hives, Swelling and Rash    HOME MEDICATIONS: Outpatient Medications Prior to Visit  Medication Sig Dispense Refill  . acetaminophen (TYLENOL) 500 MG tablet Take 500-1,000 mg by mouth every 6 (six) hours as needed for fever or headache (or pain).      Marland Kitchen ALPRAZolam (XANAX) 0.5 MG tablet Take 1 tablet (0.5 mg total) by mouth 2 (two) times daily as needed for anxiety. 20 tablet 0  . baclofen (LIORESAL) 20 MG tablet Take 1 tablet (20 mg total) by mouth 3 (three) times daily as needed for muscle spasms. (Patient taking differently: Take 20 mg by mouth See admin instructions. Once to twice a day for breakthrough spasms) 180 each 3  . buPROPion (WELLBUTRIN XL) 300 MG 24 hr tablet Take 300 mg by mouth daily.    Marland Kitchen CALCIUM CITRATE PO Take 2 tablets by mouth 2 (two) times daily.    . cetirizine (ZYRTEC) 10 MG tablet Take 10 mg by mouth daily.    . Cholecalciferol (VITAMIN D) 2000 units tablet Take 2,000 Units by mouth 2 (two) times daily.    Marland Kitchen co-enzyme Q-10 30 MG capsule Take 30 mg by mouth daily.    . corticotropin (ATHACAR H.P.) 80 UNIT/ML injectable gel Inject 80 units subcutaneously once daily for 5 consecutive days every 30 days. 5 mL 5  . diltiazem (CARDIZEM CD) 120 MG 24 hr capsule Take 1 capsule (120 mg total) by mouth daily. 90 capsule 3  . docusate sodium (COLACE) 100 MG capsule Take 1 capsule (100 mg total) by mouth daily. 10 capsule 0  . fluticasone (FLONASE) 50 MCG/ACT nasal spray Place 2 sprays into both nostrils daily as needed for allergies.     . furosemide (LASIX) 40 MG tablet Take 1.5 tablets (60 mg total) by mouth daily. Take 60 mg  by mouth daily (Patient taking differently: Take 60 mg by mouth daily. May take an additional 40 mg as needed if edema is still present) 135 tablet 3  . gabapentin (NEURONTIN) 300 MG capsule Take 1 capsule by mouth up to four times daily. (Patient taking differently: Take 300 mg by mouth See admin instructions. Three to four times a day) 360 capsule 3  . GENERLAC 10 GM/15ML SOLN TAKE 7.5 ML BY MOUTH TWICE DAILY 900 mL 5  . Interferon Beta-1b (BETASERON/EXTAVIA) 0.3 MG KIT injection Every other day , subcutaneus. Please titrate pt based on normal titration schedule. 1 each 12  . levothyroxine (SYNTHROID,  LEVOTHROID) 75 MCG tablet Take 75 mcg by mouth daily.    . Multiple Vitamins-Minerals (ONE-A-DAY WOMENS 50+ ADVANTAGE) TABS Take 1 tablet by mouth daily.    . Nebivolol HCl (BYSTOLIC) 20 MG TABS Take 20 mg by mouth daily.    . Omega-3 Fatty Acids (FISH OIL) 1000 MG CAPS Take 1,000 mg by mouth daily.    Marland Kitchen omeprazole (PRILOSEC) 20 MG capsule Take 20 mg by mouth daily.     . psyllium (METAMUCIL SMOOTH TEXTURE) 28 % packet Take 1 packet by mouth every other day.    . ranitidine (ZANTAC) 150 MG tablet Take 150 mg by mouth at bedtime.     . simvastatin (ZOCOR) 20 MG tablet Take 10 mg by mouth at bedtime.     . SYRINGE/NEEDLE, DISP, 1 ML (BD LUER-LOK SYRINGE) 25G X 5/8" 1 ML MISC Use as directed to inject  Acthar 10 each prn  . Tamsulosin HCl (FLOMAX) 0.4 MG CAPS Take 0.4 mg by mouth daily.    Marland Kitchen UNABLE TO FIND Baclofen pump: Continuous    . warfarin (COUMADIN) 3 MG tablet Take 1 - 2 tablet by mouth daily or as directed by coumadin clinic. 60 tablet 0  . warfarin (COUMADIN) 5 MG tablet Take 1/2 to 1 tablet by mouth daily as directed by coumadin clinic. 90 tablet 0   Facility-Administered Medications Prior to Visit  Medication Dose Route Frequency Provider Last Rate Last Dose  . baclofen (LIORESAL) intrathecal injection 40 mg/52m  40 mg Intrathecal Once CKathrynn Ducking MD        PAST MEDICAL HISTORY: Past Medical History:  Diagnosis Date  . Abnormality of gait 03/01/2015  . Anxiety   . Arthritis    "knees" (01/19/2016)  . Atrial fibrillation with RVR ([facility] 01/19/2016  . Childhood asthma   . Chronic pain    "nerve pain from the MS"  . Depression   . DVT (deep venous thrombosis) ([facility] 1983   "RLE; may have just been phlebitis; it was before the age of dopplers"  . 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 GERD (gastroesophageal reflux disease)   . History of stress test 06/21/10   limited exam with some degree of breast attenuatin of the  apex however a component of apical ischemia is not excluded, EF 62%; cardiac cath   . HLD (hyperlipidemia)   . Hx of echocardiogram 04/22/10   EF 55-60%, no valve issues  . Hypertension   . Hypothyroidism   . Migraine    "none since I stopped beta blocker in 1990s" (01/19/2016)  . Multiple sclerosis ([facility]    has had this may 1989-[place] reg doc  . OSA on CPAP   . PAF (paroxysmal atrial fibrillation) ([facility])    on coumadin; documented  on monitor 06/2010  . Pneumonia 11/2011  . Shortness of breath   . Thyroid disease     PAST SURGICAL HISTORY: Past Surgical History:  Procedure Laterality Date  . ANTERIOR CERVICAL DECOMP/DISCECTOMY FUSION  01/2004  . BACK SURGERY    . CARDIAC CATHETERIZATION  06/22/2010   normal coronary arteries, PAF  . CATARACT EXTRACTION, BILATERAL Bilateral   . CHILD BIRTHS     X2  . DILATION AND CURETTAGE OF UTERUS    . INFUSION PUMP IMPLANTATION  ~ 2009   baclofen infusion in lower abd  . PAIN PUMP REVISION N/A 07/29/2014   Procedure: Baclofen pump replacement;  Surgeon: Erline Levine, MD;  Location: [facility];  Service: Neurosurgery;  Laterality: N/A;  Baclofen pump replacement  . TONSILLECTOMY AND ADENOIDECTOMY    . VARICOSE VEIN SURGERY  ~ 1968  . VENOUS ABLATION  12/16/2010   radiofreq ablation -Dr Elisabeth Cara and [facility]  . WISDOM TOOTH EXTRACTION      FAMILY HISTORY: Family History  Problem Relation Age of Onset  . Coronary artery disease Father     at age 35  . Coronary artery disease Maternal Grandmother   . Depression Maternal Grandmother   . Cancer Paternal Grandmother   . Depression Mother   . Alcoholism Son     SOCIAL HISTORY: Social History   Social History  . Marital status: Married    Spouse name: N/A  . Number of children: 2  . Years of education: COLLEGE   Occupational History  . RETIRED    Social History Main Topics  . Smoking status: Former Smoker    Packs/day: 0.50    Years: 7.00    Types: Cigarettes    Quit date:  08/21/1976  . Smokeless tobacco: Never Used  . Alcohol use 0.6 oz/week    1 Glasses of wine per week  . Drug use: No  . Sexual activity: Not Currently   Other Topics Concern  . Not on file   Social History Narrative   Lives in [place] with Husband [name] [phone], [phone]   Currently in rehab         PHYSICAL EXAM  Vitals:   12/12/16 1126  BP: 120/72  Pulse: 66  Resp: 18  Weight: 265 lb (120.2 kg)  Height: 5' 3" (1.6 m)   Body mass index is 46.94 kg/m.  Generalized: Well developed, in no acute distress, Obese  Neurological examination  Mentation: Alert oriented to time, place, history taking. Follows all commands speech and language fluent Cranial nerve II-XII: Pupils were equal round reactive to light. Extraocular movements were full, visual field were full on confrontational test. Facial sensation and strength were normal. Uvula tongue midline. Head turning and shoulder shrug  were normal and symmetric. Motor: Good strength throughout. Slightly weaker in the right lower extremity.Kermit Balo symmetric motor tone is noted throughout.  Sensory: Sensory testing is intact to soft touch on all 4 extremities. No evidence of extinction is noted.  Coordination: Cerebellar testing reveals good finger-nose-finger and heel-to-shin bilaterally.  Gait and station: A Geryl Councilman is in a wheelchair. Reflexes: Deep tendon reflexes are symmetric and normal bilaterally.   DIAGNOSTIC DATA (LABS, IMAGING, TESTING) - I reviewed patient records, labs, notes, testing and imaging myself where available.  Lab Results  Component Value Date   WBC 7.5 08/12/2016   HGB 13.2 08/12/2016   HCT 39.4 08/12/2016   MCV 91.4 08/12/2016   PLT 219 08/12/2016      Component Value Date/Time  NA 138 08/12/2016 0400   NA 140 06/17/2013 1337   K 3.9 08/12/2016 0400   CL 99 (L) 08/12/2016 0400   CO2 31 08/12/2016 0400   GLUCOSE 147 (H) 08/12/2016 0400   BUN 20 08/12/2016 0400   BUN 13 06/17/2013 1337    CREATININE 0.60 08/12/2016 0400   CALCIUM 9.2 08/12/2016 0400   PROT 6.2 (L) 08/11/2016 0332   PROT 5.9 (L) 06/17/2013 1337   ALBUMIN 2.9 (L) 08/11/2016 0332   ALBUMIN 3.9 06/17/2013 1337   AST 20 08/11/2016 0332   ALT 23 08/11/2016 0332   ALKPHOS 54 08/11/2016 0332   BILITOT 0.4 08/11/2016 0332   GFRNONAA >60 08/12/2016 0400   GFRAA >60 08/12/2016 0400     No results found for: [id number] Lab Results  Component Value Date   TSH 1.083 08/06/2016      ASSESSMENT AND PLAN 71 y.o. year old female  has a past medical history of Abnormality of gait (03/01/2015); Anxiety; Arthritis; Atrial fibrillation with RVR ([place]) (01/19/2016); Childhood asthma; Chronic pain; Depression; DVT (deep venous thrombosis) ([place]) (1983); Edema; GERD (gastroesophageal reflux disease); History of stress test (06/21/10); HLD (hyperlipidemia); echocardiogram (04/22/10); Hypertension; Hypothyroidism; Migraine; Multiple sclerosis ([place]); OSA on CPAP; PAF (paroxysmal atrial fibrillation) ([place]); Pneumonia (11/2011); Shortness of breath; and Thyroid disease. here with:  1. Multiple sclerosis 2. Obstructive sleep apnea on BiPAP  The patient will continue on Betaseron. I will check blood work today. The patient has excellent compliance with her BiPAP. However she continues to have a high residual apnea index. We have discussed weight loss today. I have encouraged her to schedule an appointment at [facility]. She verbalized understanding. I will discuss her download with Dr. Brett Fairy when she returns from vacation. Patient voiced understanding. She will follow-up in 3 months or sooner if needed.    Ward Givens, MSN, NP-C 12/12/2016, 1:33 PM [facility] [address] [place], [place] [address] [phone]

## 2016-12-13 DIAGNOSIS — I4891 Unspecified atrial fibrillation: Secondary | ICD-10-CM | POA: Diagnosis not present

## 2016-12-13 DIAGNOSIS — N319 Neuromuscular dysfunction of bladder, unspecified: Secondary | ICD-10-CM | POA: Diagnosis not present

## 2016-12-13 DIAGNOSIS — E119 Type 2 diabetes mellitus without complications: Secondary | ICD-10-CM | POA: Diagnosis not present

## 2016-12-13 DIAGNOSIS — I10 Essential (primary) hypertension: Secondary | ICD-10-CM | POA: Diagnosis not present

## 2016-12-13 DIAGNOSIS — K592 Neurogenic bowel, not elsewhere classified: Secondary | ICD-10-CM | POA: Diagnosis not present

## 2016-12-13 DIAGNOSIS — G35 Multiple sclerosis: Secondary | ICD-10-CM | POA: Diagnosis not present

## 2016-12-15 DIAGNOSIS — G35 Multiple sclerosis: Secondary | ICD-10-CM | POA: Diagnosis not present

## 2016-12-15 DIAGNOSIS — N319 Neuromuscular dysfunction of bladder, unspecified: Secondary | ICD-10-CM | POA: Diagnosis not present

## 2016-12-15 DIAGNOSIS — I10 Essential (primary) hypertension: Secondary | ICD-10-CM | POA: Diagnosis not present

## 2016-12-15 DIAGNOSIS — E119 Type 2 diabetes mellitus without complications: Secondary | ICD-10-CM | POA: Diagnosis not present

## 2016-12-15 DIAGNOSIS — K592 Neurogenic bowel, not elsewhere classified: Secondary | ICD-10-CM | POA: Diagnosis not present

## 2016-12-15 DIAGNOSIS — I4891 Unspecified atrial fibrillation: Secondary | ICD-10-CM | POA: Diagnosis not present

## 2016-12-18 ENCOUNTER — Telehealth: Payer: Self-pay | Admitting: Internal Medicine

## 2016-12-18 DIAGNOSIS — I10 Essential (primary) hypertension: Secondary | ICD-10-CM | POA: Diagnosis not present

## 2016-12-18 DIAGNOSIS — G35 Multiple sclerosis: Secondary | ICD-10-CM | POA: Diagnosis not present

## 2016-12-18 DIAGNOSIS — E119 Type 2 diabetes mellitus without complications: Secondary | ICD-10-CM | POA: Diagnosis not present

## 2016-12-18 DIAGNOSIS — K592 Neurogenic bowel, not elsewhere classified: Secondary | ICD-10-CM | POA: Diagnosis not present

## 2016-12-18 DIAGNOSIS — I4891 Unspecified atrial fibrillation: Secondary | ICD-10-CM | POA: Diagnosis not present

## 2016-12-18 DIAGNOSIS — N319 Neuromuscular dysfunction of bladder, unspecified: Secondary | ICD-10-CM | POA: Diagnosis not present

## 2016-12-18 NOTE — Telephone Encounter (Signed)
Faxed signed orders r/t PT/INR and PT goals to [facility]

## 2016-12-21 ENCOUNTER — Ambulatory Visit (INDEPENDENT_AMBULATORY_CARE_PROVIDER_SITE_OTHER): Payer: Medicare Other | Admitting: Pharmacist

## 2016-12-21 DIAGNOSIS — I10 Essential (primary) hypertension: Secondary | ICD-10-CM | POA: Diagnosis not present

## 2016-12-21 DIAGNOSIS — K592 Neurogenic bowel, not elsewhere classified: Secondary | ICD-10-CM | POA: Diagnosis not present

## 2016-12-21 DIAGNOSIS — G35 Multiple sclerosis: Secondary | ICD-10-CM | POA: Diagnosis not present

## 2016-12-21 DIAGNOSIS — E119 Type 2 diabetes mellitus without complications: Secondary | ICD-10-CM | POA: Diagnosis not present

## 2016-12-21 DIAGNOSIS — I4891 Unspecified atrial fibrillation: Secondary | ICD-10-CM | POA: Diagnosis not present

## 2016-12-21 DIAGNOSIS — Z7901 Long term (current) use of anticoagulants: Secondary | ICD-10-CM

## 2016-12-21 DIAGNOSIS — N319 Neuromuscular dysfunction of bladder, unspecified: Secondary | ICD-10-CM | POA: Diagnosis not present

## 2016-12-21 LAB — PROTIME-INR: INR: 3.6 — AB (ref <=1.1)

## 2016-12-21 NOTE — Progress Notes (Signed)
I agree with the assessment and plan as directed by NP .The patient is known to me .   Zaelynn Fuchs, MD  

## 2016-12-22 DIAGNOSIS — R609 Edema, unspecified: Secondary | ICD-10-CM | POA: Diagnosis not present

## 2016-12-22 DIAGNOSIS — E669 Obesity, unspecified: Secondary | ICD-10-CM | POA: Diagnosis not present

## 2016-12-22 DIAGNOSIS — F418 Other specified anxiety disorders: Secondary | ICD-10-CM | POA: Diagnosis not present

## 2016-12-22 DIAGNOSIS — G4733 Obstructive sleep apnea (adult) (pediatric): Secondary | ICD-10-CM | POA: Diagnosis not present

## 2016-12-22 DIAGNOSIS — E784 Other hyperlipidemia: Secondary | ICD-10-CM | POA: Diagnosis not present

## 2016-12-22 DIAGNOSIS — R7301 Impaired fasting glucose: Secondary | ICD-10-CM | POA: Diagnosis not present

## 2016-12-22 DIAGNOSIS — G35 Multiple sclerosis: Secondary | ICD-10-CM | POA: Diagnosis not present

## 2016-12-22 DIAGNOSIS — I10 Essential (primary) hypertension: Secondary | ICD-10-CM | POA: Diagnosis not present

## 2016-12-24 ENCOUNTER — Encounter (HOSPITAL_COMMUNITY): Payer: Self-pay | Admitting: Emergency Medicine

## 2016-12-24 ENCOUNTER — Observation Stay (HOSPITAL_COMMUNITY): Payer: Medicare Other

## 2016-12-24 ENCOUNTER — Inpatient Hospital Stay (HOSPITAL_COMMUNITY)
Admission: EM | Admit: 2016-12-24 | Discharge: 2016-12-28 | DRG: 059 | Disposition: A | Discharge status: Skilled Nursing Facility | Payer: Medicare Other | Attending: Internal Medicine | Admitting: Internal Medicine

## 2016-12-24 ENCOUNTER — Emergency Department (HOSPITAL_COMMUNITY): Payer: Medicare Other

## 2016-12-24 DIAGNOSIS — K5900 Constipation, unspecified: Secondary | ICD-10-CM

## 2016-12-24 DIAGNOSIS — F40240 Claustrophobia: Secondary | ICD-10-CM | POA: Diagnosis present

## 2016-12-24 DIAGNOSIS — Z7952 Long term (current) use of systemic steroids: Secondary | ICD-10-CM

## 2016-12-24 DIAGNOSIS — Z993 Dependence on wheelchair: Secondary | ICD-10-CM

## 2016-12-24 DIAGNOSIS — R29898 Other symptoms and signs involving the musculoskeletal system: Secondary | ICD-10-CM

## 2016-12-24 DIAGNOSIS — E785 Hyperlipidemia, unspecified: Secondary | ICD-10-CM | POA: Diagnosis present

## 2016-12-24 DIAGNOSIS — R791 Abnormal coagulation profile: Secondary | ICD-10-CM | POA: Diagnosis not present

## 2016-12-24 DIAGNOSIS — I5042 Chronic combined systolic (congestive) and diastolic (congestive) heart failure: Secondary | ICD-10-CM | POA: Diagnosis not present

## 2016-12-24 DIAGNOSIS — M79662 Pain in left lower leg: Secondary | ICD-10-CM | POA: Diagnosis present

## 2016-12-24 DIAGNOSIS — G4733 Obstructive sleep apnea (adult) (pediatric): Secondary | ICD-10-CM | POA: Diagnosis present

## 2016-12-24 DIAGNOSIS — Z9989 Dependence on other enabling machines and devices: Secondary | ICD-10-CM

## 2016-12-24 DIAGNOSIS — R531 Weakness: Secondary | ICD-10-CM

## 2016-12-24 DIAGNOSIS — M1612 Unilateral primary osteoarthritis, left hip: Secondary | ICD-10-CM | POA: Diagnosis not present

## 2016-12-24 DIAGNOSIS — M79605 Pain in left leg: Secondary | ICD-10-CM

## 2016-12-24 DIAGNOSIS — Z79899 Other long term (current) drug therapy: Secondary | ICD-10-CM

## 2016-12-24 DIAGNOSIS — Z86718 Personal history of other venous thrombosis and embolism: Secondary | ICD-10-CM

## 2016-12-24 DIAGNOSIS — R05 Cough: Secondary | ICD-10-CM

## 2016-12-24 DIAGNOSIS — Z87891 Personal history of nicotine dependence: Secondary | ICD-10-CM

## 2016-12-24 DIAGNOSIS — Z7901 Long term (current) use of anticoagulants: Secondary | ICD-10-CM | POA: Diagnosis not present

## 2016-12-24 DIAGNOSIS — R269 Unspecified abnormalities of gait and mobility: Secondary | ICD-10-CM | POA: Diagnosis not present

## 2016-12-24 DIAGNOSIS — Z6841 Body Mass Index (BMI) 40.0 and over, adult: Secondary | ICD-10-CM | POA: Diagnosis not present

## 2016-12-24 DIAGNOSIS — M545 Low back pain, unspecified: Secondary | ICD-10-CM

## 2016-12-24 DIAGNOSIS — E662 Morbid (severe) obesity with alveolar hypoventilation: Secondary | ICD-10-CM | POA: Diagnosis present

## 2016-12-24 DIAGNOSIS — R059 Cough, unspecified: Secondary | ICD-10-CM

## 2016-12-24 DIAGNOSIS — M79609 Pain in unspecified limb: Secondary | ICD-10-CM

## 2016-12-24 DIAGNOSIS — R52 Pain, unspecified: Secondary | ICD-10-CM

## 2016-12-24 DIAGNOSIS — I480 Paroxysmal atrial fibrillation: Secondary | ICD-10-CM | POA: Diagnosis present

## 2016-12-24 DIAGNOSIS — K219 Gastro-esophageal reflux disease without esophagitis: Secondary | ICD-10-CM | POA: Diagnosis present

## 2016-12-24 DIAGNOSIS — E039 Hypothyroidism, unspecified: Secondary | ICD-10-CM | POA: Diagnosis present

## 2016-12-24 DIAGNOSIS — I5040 Unspecified combined systolic (congestive) and diastolic (congestive) heart failure: Secondary | ICD-10-CM | POA: Diagnosis present

## 2016-12-24 DIAGNOSIS — G35 Multiple sclerosis: Secondary | ICD-10-CM | POA: Diagnosis present

## 2016-12-24 DIAGNOSIS — I999 Unspecified disorder of circulatory system: Secondary | ICD-10-CM | POA: Diagnosis not present

## 2016-12-24 DIAGNOSIS — J029 Acute pharyngitis, unspecified: Secondary | ICD-10-CM | POA: Diagnosis not present

## 2016-12-24 DIAGNOSIS — Z888 Allergy status to other drugs, medicaments and biological substances status: Secondary | ICD-10-CM

## 2016-12-24 DIAGNOSIS — I110 Hypertensive heart disease with heart failure: Secondary | ICD-10-CM | POA: Diagnosis present

## 2016-12-24 DIAGNOSIS — Z95828 Presence of other vascular implants and grafts: Secondary | ICD-10-CM

## 2016-12-24 DIAGNOSIS — M25559 Pain in unspecified hip: Secondary | ICD-10-CM | POA: Diagnosis not present

## 2016-12-24 DIAGNOSIS — Z981 Arthrodesis status: Secondary | ICD-10-CM

## 2016-12-24 DIAGNOSIS — Z5181 Encounter for therapeutic drug level monitoring: Secondary | ICD-10-CM

## 2016-12-24 DIAGNOSIS — M6281 Muscle weakness (generalized): Secondary | ICD-10-CM | POA: Diagnosis not present

## 2016-12-24 DIAGNOSIS — Z885 Allergy status to narcotic agent status: Secondary | ICD-10-CM

## 2016-12-24 DIAGNOSIS — Z886 Allergy status to analgesic agent status: Secondary | ICD-10-CM

## 2016-12-24 DIAGNOSIS — F329 Major depressive disorder, single episode, unspecified: Secondary | ICD-10-CM | POA: Diagnosis present

## 2016-12-24 LAB — CBC WITH DIFFERENTIAL/PLATELET
BASOS PCT: 0 %
Basophils Absolute: 0 10*3/uL (ref 0.0–0.1)
EOS ABS: 0.2 10*3/uL (ref 0.0–0.7)
Eosinophils Relative: 4 %
HCT: 43.9 % (ref 36.0–46.0)
HEMOGLOBIN: 14.3 g/dL (ref 12.0–15.0)
Lymphocytes Relative: 34 %
Lymphs Abs: 1.8 10*3/uL (ref 0.7–4.0)
MCH: 29.9 pg (ref 26.0–34.0)
MCHC: 32.6 g/dL (ref 30.0–36.0)
MCV: 91.8 fL (ref 78.0–100.0)
Monocytes Absolute: 0.4 10*3/uL (ref 0.1–1.0)
Monocytes Relative: 7 %
NEUTROS ABS: 2.9 10*3/uL (ref 1.7–7.7)
NEUTROS PCT: 55 %
Platelets: 168 10*3/uL (ref 150–400)
RBC: 4.78 MIL/uL (ref 3.87–5.11)
RDW: 12.8 % (ref 11.5–15.5)
WBC: 5.2 10*3/uL (ref 4.0–10.5)

## 2016-12-24 LAB — COMPREHENSIVE METABOLIC PANEL
ALBUMIN: 3.6 g/dL (ref 3.5–5.0)
ALK PHOS: 56 U/L (ref 38–126)
ALT: 19 U/L (ref 14–54)
ANION GAP: 8 (ref 5–15)
AST: 19 U/L (ref 15–41)
BUN: 18 mg/dL (ref 6–20)
CALCIUM: 9.4 mg/dL (ref 8.9–10.3)
CO2: 29 mmol/L (ref 22–32)
CREATININE: 0.83 mg/dL (ref 0.44–1.00)
Chloride: 102 mmol/L (ref 101–111)
GFR calc Af Amer: >60 mL/min (ref >60)
GFR calc non Af Amer: >60 mL/min (ref >60)
GLUCOSE: 90 mg/dL (ref 65–99)
Potassium: 4 mmol/L (ref 3.5–5.1)
SODIUM: 139 mmol/L (ref 135–145)
Total Bilirubin: 0.6 mg/dL (ref 0.3–1.2)
Total Protein: 6.4 g/dL — ABNORMAL LOW (ref 6.5–8.1)

## 2016-12-24 LAB — PROTIME-INR
INR: 1.57
Prothrombin Time: 18.9 seconds — ABNORMAL HIGH (ref 11.4–15.2)

## 2016-12-24 MED ORDER — SODIUM CHLORIDE 0.9% FLUSH: 3.0000 mL | INTRAVENOUS | Status: DC | PRN

## 2016-12-24 MED ORDER — NEBIVOLOL HCL 10 MG PO TABS
20.0000 mg | ORAL_TABLET | Freq: Every day | ORAL | Status: DC
  Administered 2016-12-25 – 2016-12-28 (×4): 20 mg via ORAL
  Filled 2016-12-24 (×5): qty 2

## 2016-12-24 MED ORDER — TAMSULOSIN HCL 0.4 MG PO CAPS
0.4000 mg | ORAL_CAPSULE | Freq: Every day | ORAL | Status: DC
  Administered 2016-12-25 – 2016-12-28 (×4): 0.4 mg via ORAL
  Filled 2016-12-24 (×4): qty 1

## 2016-12-24 MED ORDER — WARFARIN - PHARMACIST DOSING INPATIENT
Freq: Every day | Status: DC
  Administered 2016-12-24 – 2016-12-25 (×2)

## 2016-12-24 MED ORDER — SODIUM CHLORIDE 0.9 % IV SOLN: 250.0000 mL | INTRAVENOUS | Status: DC | PRN

## 2016-12-24 MED ORDER — METHYLPREDNISOLONE 4 MG PO TBPK: ORAL_TABLET | ORAL | 0 refills | Status: DC

## 2016-12-24 MED ORDER — ACETAMINOPHEN 500 MG PO TABS
1000.0000 mg | ORAL_TABLET | Freq: Once | ORAL | Status: AC
  Administered 2016-12-24: 1000 mg via ORAL
  Filled 2016-12-24: qty 2

## 2016-12-24 MED ORDER — BACLOFEN 10 MG PO TABS: 20.0000 mg | ORAL_TABLET | Freq: Two times a day (BID) | ORAL | Status: DC | PRN

## 2016-12-24 MED ORDER — DILTIAZEM HCL ER COATED BEADS 120 MG PO CP24
120.0000 mg | ORAL_CAPSULE | Freq: Every day | ORAL | Status: DC
  Administered 2016-12-25 – 2016-12-28 (×4): 120 mg via ORAL
  Filled 2016-12-24 (×4): qty 1

## 2016-12-24 MED ORDER — LEVOTHYROXINE SODIUM 75 MCG PO TABS
75.0000 ug | ORAL_TABLET | Freq: Every day | ORAL | Status: DC
  Administered 2016-12-26 – 2016-12-28 (×3): 75 ug via ORAL
  Filled 2016-12-24 (×3): qty 1

## 2016-12-24 MED ORDER — BUPROPION HCL ER (XL) 150 MG PO TB24
300.0000 mg | ORAL_TABLET | Freq: Every day | ORAL | Status: DC
  Administered 2016-12-25 – 2016-12-28 (×4): 300 mg via ORAL
  Filled 2016-12-24 (×4): qty 2

## 2016-12-24 MED ORDER — METHYLPREDNISOLONE SODIUM SUCC 1000 MG IJ SOLR
1000.0000 mg | Freq: Once | INTRAMUSCULAR | Status: AC
  Administered 2016-12-24: 1000 mg via INTRAVENOUS
  Filled 2016-12-24: qty 8

## 2016-12-24 MED ORDER — ENOXAPARIN SODIUM 120 MG/0.8ML ~~LOC~~ SOLN
1.0000 mg/kg | Freq: Once | SUBCUTANEOUS | Status: AC
  Administered 2016-12-24: 120 mg via SUBCUTANEOUS
  Filled 2016-12-24: qty 0.8

## 2016-12-24 MED ORDER — SODIUM CHLORIDE 0.9% FLUSH
3.0000 mL | Freq: Two times a day (BID) | INTRAVENOUS | Status: DC
  Administered 2016-12-24 – 2016-12-28 (×8): 3 mL via INTRAVENOUS

## 2016-12-24 MED ORDER — WARFARIN SODIUM 5 MG PO TABS
5.0000 mg | ORAL_TABLET | Freq: Once | ORAL | Status: AC
  Administered 2016-12-24: 5 mg via ORAL
  Filled 2016-12-24: qty 1

## 2016-12-24 NOTE — Consult Note (Signed)
NEURO HOSPITALIST CONSULT NOTE   Requestig physician: Dr. Shanon Brow  Reason for Consult: Possible MS exacerbation  History obtained from:   Patient and Chart     HPI:                                                                                                                                          Toni [name] is an 71 y.o. female with MS on Betaseron who presented to the ED for evaluation of left thigh pain that began on Saturday night. The pain increases with movement and palpation. She was worried that she may have developed a DVT, as she has a history of such years ago. Of note, she has atrial fibrillation and is on Coumadin. She complained of both pain and weakness with hip flexion, but initially stated that this did not feel that her symptoms were similar to her MS flares in the past. She has generalized weakness at baseline, worse on the right, and uses a wheelchair for most mobility. She was diagnosed with MS in 1989.   Ultrasound in the ED was negative for DVT per report. She was in the process of being discharged but she did not want to go home. She was unable to have an MRI due to claustrophobia. Decision was to admit her for consideration of IV steroid treatment based upon clinical evidence for MS flare.   Her PMHx includes OSA on BIPAP, atrial fibrillation, anxiety, chronic pain attributed to her MS, HTN, hypothyroidism and migraine headaches. She has an implanted baclofen pump which was recently refilled.   Past Medical History:  Diagnosis Date  . Abnormality of gait 03/01/2015  . Anxiety   . Arthritis    "knees" (01/19/2016)  . Atrial fibrillation with RVR ([place]) 01/19/2016  . Childhood asthma   . Chronic pain    "nerve pain from the MS"  . Depression   . DVT (deep venous thrombosis) ([place]) 1983   "RLE; may have just been phlebitis; it was before the age of dopplers"  . 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 GERD (gastroesophageal reflux disease)   . History of stress test 06/21/10   limited exam with some degree of breast attenuatin of the apex however a component of apical ischemia is not excluded, EF 62%; cardiac cath   . HLD (hyperlipidemia)   . Hx of echocardiogram 04/22/10   EF 55-60%, no valve issues  . Hypertension   . Hypothyroidism   . Migraine    "none since I stopped beta blocker in 1990s" (01/19/2016)  . Multiple sclerosis ([place])    has had this may 1989-[facility] reg doc  . OSA on CPAP   .  PAF (paroxysmal atrial fibrillation) ([place])    on coumadin; documented on monitor 06/2010  . Pneumonia 11/2011  . Shortness of breath   . Thyroid disease     Past Surgical History:  Procedure Laterality Date  . ANTERIOR CERVICAL DECOMP/DISCECTOMY FUSION  01/2004  . BACK SURGERY    . CARDIAC CATHETERIZATION  06/22/2010   normal coronary arteries, PAF  . CATARACT EXTRACTION, BILATERAL Bilateral   . CHILD BIRTHS     X2  . DILATION AND CURETTAGE OF UTERUS    . INFUSION PUMP IMPLANTATION  ~ 2009   baclofen infusion in lower abd  . PAIN PUMP REVISION N/A 07/29/2014   Procedure: Baclofen pump replacement;  Surgeon: Erline Levine, MD;  Location: [place] [facility];  Service: Neurosurgery;  Laterality: N/A;  Baclofen pump replacement  . TONSILLECTOMY AND ADENOIDECTOMY    . VARICOSE VEIN SURGERY  ~ 1968  . VENOUS ABLATION  12/16/2010   radiofreq ablation -Dr Elisabeth Cara and [facility]  . WISDOM TOOTH EXTRACTION      Family History  Problem Relation Age of Onset  . Coronary artery disease Father     at age 13  . Coronary artery disease Maternal Grandmother   . Depression Maternal Grandmother   . Cancer Paternal Grandmother   . Depression Mother   . Alcoholism Son    Social History:  reports that she quit smoking about 40 years ago. Her smoking use included Cigarettes. She has a 3.50 pack-year smoking history. She has never used smokeless tobacco. She reports that she drinks about 0.6 oz of  alcohol per week . She reports that she does not use drugs.  Allergies  Allergen Reactions  . Hydrocodone Nausea And Vomiting    Hydrocodone causes vomiting  . Oxycodone Nausea And Vomiting    Oral oxycodone causes vomiting  . Chlorhexidine Gluconate Rash    Sores at site  . Zoloft [Sertraline] Hives, Swelling and Rash    MEDICATIONS:                                                                                                                     Prior to Admission:  Facility-Administered Medications Prior to Admission  Medication Dose Route Frequency Provider Last Rate Last Dose  . baclofen (LIORESAL) intrathecal injection 40 mg/71m  40 mg Intrathecal Once WKathrynn Ducking MD       Prescriptions Prior to Admission  Medication Sig Dispense Refill Last Dose  . acetaminophen (TYLENOL) 500 MG tablet Take 500-1,000 mg by mouth every 6 (six) hours as needed for fever or headache (or pain).   Past Month at Unknown time  . ALPRAZolam (XANAX) 0.5 MG tablet Take 1 tablet (0.5 mg total) by mouth 2 (two) times daily as needed for anxiety. 20 tablet 0 12/24/2016 at Unknown time  . baclofen (LIORESAL) 20 MG tablet Take 1 tablet (20 mg total) by mouth 3 (three) times daily as needed for muscle spasms. (Patient taking differently: Take 20 mg by mouth See admin instructions.  Once to twice a day for breakthrough spasms) 180 each 3  at prn  . buPROPion (WELLBUTRIN XL) 300 MG 24 hr tablet Take 300 mg by mouth daily.   12/24/2016 at Unknown time  . CALCIUM CITRATE PO Take 2 tablets by mouth 2 (two) times daily.   12/23/2016 at Unknown time  . cetirizine (ZYRTEC) 10 MG tablet Take 10 mg by mouth daily.   12/23/2016 at Unknown time  . Cholecalciferol (VITAMIN D) 2000 units tablet Take 2,000 Units by mouth 2 (two) times daily.   12/23/2016 at Unknown time  . co-enzyme Q-10 30 MG capsule Take 30 mg by mouth daily.   12/23/2016 at Unknown time  . corticotropin (ATHACAR H.P.) 80 UNIT/ML injectable gel Inject 80 units  subcutaneously once daily for 5 consecutive days every 30 days. 5 mL 5 Past Month at Unknown time  . diltiazem (CARDIZEM CD) 120 MG 24 hr capsule Take 1 capsule (120 mg total) by mouth daily. 90 capsule 3 12/24/2016 at Unknown time  . furosemide (LASIX) 40 MG tablet Take 1.5 tablets (60 mg total) by mouth daily. Take 60 mg by mouth daily (Patient taking differently: Take 60 mg by mouth daily. May take an additional 40 mg as needed if edema is still present) 135 tablet 3 12/23/2016 at Unknown time  . gabapentin (NEURONTIN) 300 MG capsule TAKE 1 CAPSULE BY MOUTH UP  TO 4 TIMES DAILY (Patient taking differently: TAKE 1 CAPSULE BY MOUTH UP  TO 3 times daily) 360 capsule 3 12/24/2016 at Unknown time  . GENERLAC 10 GM/15ML SOLN TAKE 7.5 ML BY MOUTH TWICE DAILY 900 mL 5 12/23/2016 at Unknown time  . Interferon Beta-1b (BETASERON/EXTAVIA) 0.3 MG KIT injection Every other day , subcutaneus. Please titrate pt based on normal titration schedule. 1 each 12 12/23/2016 at Unknown time  . levothyroxine (SYNTHROID, LEVOTHROID) 75 MCG tablet Take 75 mcg by mouth daily.   12/24/2016 at Unknown time  . Multiple Vitamins-Minerals (ONE-A-DAY WOMENS 50+ ADVANTAGE) TABS Take 1 tablet by mouth daily.   12/23/2016 at Unknown time  . Nebivolol HCl (BYSTOLIC) 20 MG TABS Take 20 mg by mouth daily.   12/24/2016 at Unknown time  . Omega-3 Fatty Acids (FISH OIL) 1000 MG CAPS Take 1,000 mg by mouth daily.   12/23/2016 at Unknown time  . omeprazole (PRILOSEC) 20 MG capsule Take 20 mg by mouth daily.    12/24/2016 at Unknown time  . psyllium (METAMUCIL SMOOTH TEXTURE) 28 % packet Take 1 packet by mouth every other day.   Past Week at Unknown time  . ranitidine (ZANTAC) 150 MG tablet Take 150 mg by mouth at bedtime.    12/23/2016 at Unknown time  . simvastatin (ZOCOR) 20 MG tablet Take 10 mg by mouth at bedtime.    12/23/2016 at Unknown time  . Tamsulosin HCl (FLOMAX) 0.4 MG CAPS Take 0.4 mg by mouth daily.   12/23/2016 at Unknown time  . UNABLE TO FIND  Baclofen pump: Continuous   12/24/2016 at Unknown time  . warfarin (COUMADIN) 5 MG tablet Take 1/2 to 1 tablet by mouth daily as directed by coumadin clinic. 90 tablet 0 12/23/2016 at Unknown time  . docusate sodium (COLACE) 100 MG capsule Take 1 capsule (100 mg total) by mouth daily. (Patient not taking: Reported on 12/24/2016) 10 capsule 0 Not Taking at Unknown time  . fluticasone (FLONASE) 50 MCG/ACT nasal spray Place 2 sprays into both nostrils daily as needed for allergies.    Not Taking at  Unknown time  . SYRINGE/NEEDLE, DISP, 1 ML (BD LUER-LOK SYRINGE) 25G X 5/8" 1 ML MISC Use as directed to inject  Acthar 10 each prn Taking  . warfarin (COUMADIN) 3 MG tablet Take 1 - 2 tablet by mouth daily or as directed by coumadin clinic. (Patient not taking: Reported on 12/24/2016) 60 tablet 0 Not Taking at Unknown time   Scheduled: . [START ON 12/25/2016] buPROPion  300 mg Oral Daily  . [START ON 12/25/2016] diltiazem  120 mg Oral Daily  . [START ON 12/25/2016] levothyroxine  75 mcg Oral Daily  . nebivolol  20 mg Oral Daily  . sodium chloride flush  3 mL Intravenous Q12H  . tamsulosin  0.4 mg Oral Daily  . warfarin  5 mg Oral Once  . Warfarin - Pharmacist Dosing Inpatient   Does not apply q1800     ROS:                                                                                                                                       History obtained from patient. No fevers, CP, SOB or nausea. Other ROS as per HPI.  Blood pressure 130/81, pulse 78, temperature 97.8 F (36.6 C), temperature source Oral, resp. rate 20, height _0  (1.6 m), weight 120.2 kg (265 lb), SpO2 94 %.  General Examination:                                                                                                      HEENT-  [place]/AT  Lungs- Respirations unlabored Extremities- Warm and well-perfused  Neurological Examination Mental Status: Alert, oriented, thought content appropriate.  Speech fluent without evidence of  aphasia.  Able to follow all commands without difficulty. Cranial Nerves: II: Visual fields intact, PERRL III,IV, VI: ptosis not present, EOMI without nystagmus V,VII: smile symmetric, facial light temp sensation normal bilaterally VIII: hearing intact to conversation IX,X: no hoarseness or hypophonia XI: symmetric XII: midline tongue extension Motor: RUE: 5/5 proximal and distal LUE: 4/5 proximal and distal RLE: 4/5 hip flexion and knee extension, 4/5 ankle dorsi/plantar flexion LLE: 2/5 hip flexion and knee extension, 3/5 ankle dorsi/plantar flexion Sensory: Decreased fine touch RLE. Allodynia sole of right foot. Decreased temp sensation to left lower leg and foot.  Deep Tendon Reflexes: 2+ bilateral brachioradialis, 0 right biceps, trace left biceps, 0 patellae, 1+ achilles bilaterally. Toes downgoing bilaterally Cerebellar: No ataxia with FNF bilaterally Gait: Unable to assess due to weakness  Lab Results: Basic  Metabolic Panel:  Recent Labs Lab 12/24/16 1442  NA 139  K 4.0  CL 102  CO2 29  GLUCOSE 90  BUN 18  CREATININE 0.83  CALCIUM 9.4    Liver Function Tests:  Recent Labs Lab 12/24/16 1442  AST 19  ALT 19  ALKPHOS 56  BILITOT 0.6  PROT 6.4*  ALBUMIN 3.6   No results for input(s): LIPASE, AMYLASE in the last 168 hours. No results for input(s): AMMONIA in the last 168 hours.  CBC:  Recent Labs Lab 12/24/16 1442  WBC 5.2  NEUTROABS 2.9  HGB 14.3  HCT 43.9  MCV 91.8  PLT 168    Cardiac Enzymes: No results for input(s): CKTOTAL, CKMB, CKMBINDEX, TROPONINI in the last 168 hours.  Lipid Panel: No results for input(s): CHOL, TRIG, HDL, CHOLHDL, VLDL, LDLCALC in the last 168 hours.  CBG: No results for input(s): GLUCAP in the last 168 hours.  Microbiology: Results for orders placed or performed during the hospital encounter of 08/05/16  MRSA PCR Screening     Status: None   Collection Time: 08/06/16  6:40 AM  Result Value Ref Range Status    MRSA by PCR NEGATIVE NEGATIVE Final    Comment:        The GeneXpert MRSA Assay (FDA approved for NASAL specimens only), is one component of a comprehensive MRSA colonization surveillance program. It is not intended to diagnose MRSA infection nor to guide or monitor treatment for MRSA infections.   Culture, blood (Routine X 2) w Reflex to ID Panel     Status: None   Collection Time: 08/07/16 12:16 PM  Result Value Ref Range Status   Specimen Description BLOOD BLOOD LEFT ARM  Final   Special Requests BOTTLES DRAWN AEROBIC AND ANAEROBIC 5CC  Final   Culture NO GROWTH 5 DAYS  Final   Report Status 08/12/2016 FINAL  Final  Culture, blood (Routine X 2) w Reflex to ID Panel     Status: Abnormal   Collection Time: 08/07/16 12:17 PM  Result Value Ref Range Status   Specimen Description BLOOD RIGHT ANTECUBITAL  Final   Special Requests BOTTLES DRAWN AEROBIC AND ANAEROBIC 5CC  Final   Culture  Setup Time   Final    GRAM POSITIVE COCCI IN CLUSTERS IN BOTH AEROBIC AND ANAEROBIC BOTTLES Organism ID to follow CRITICAL RESULT CALLED TO, READ BACK BY AND VERIFIED WITH: N. Batchelder Pharm.D. 17:15 08/08/16  ([email or web address])    Culture (A)  Final    STAPHYLOCOCCUS SPECIES (COAGULASE NEGATIVE) THE SIGNIFICANCE OF ISOLATING THIS ORGANISM FROM A SINGLE SET OF BLOOD CULTURES WHEN MULTIPLE SETS ARE DRAWN IS UNCERTAIN. PLEASE NOTIFY THE MICROBIOLOGY DEPARTMENT WITHIN ONE WEEK IF SPECIATION AND SENSITIVITIES ARE REQUIRED.    Report Status 08/10/2016 FINAL  Final  Blood Culture ID Panel (Reflexed)     Status: Abnormal   Collection Time: 08/07/16 12:17 PM  Result Value Ref Range Status   Enterococcus species NOT DETECTED NOT DETECTED Final   Listeria monocytogenes NOT DETECTED NOT DETECTED Final   Staphylococcus species DETECTED (A) NOT DETECTED Final    Comment: CRITICAL RESULT CALLED TO, READ BACK BY AND VERIFIED WITH: N. Batchelder Pharm.D. 17:15 08/08/16 ([email or web address])    Staphylococcus aureus NOT DETECTED  NOT DETECTED Final   Methicillin resistance NOT DETECTED NOT DETECTED Final   Streptococcus species NOT DETECTED NOT DETECTED Final   Streptococcus agalactiae NOT DETECTED NOT DETECTED Final   Streptococcus pneumoniae NOT DETECTED NOT DETECTED Final   Streptococcus pyogenes NOT  DETECTED NOT DETECTED Final   Acinetobacter baumannii NOT DETECTED NOT DETECTED Final   Enterobacteriaceae species NOT DETECTED NOT DETECTED Final   Enterobacter cloacae complex NOT DETECTED NOT DETECTED Final   Escherichia coli NOT DETECTED NOT DETECTED Final   Klebsiella oxytoca NOT DETECTED NOT DETECTED Final   Klebsiella pneumoniae NOT DETECTED NOT DETECTED Final   Proteus species NOT DETECTED NOT DETECTED Final   Serratia marcescens NOT DETECTED NOT DETECTED Final   Haemophilus influenzae NOT DETECTED NOT DETECTED Final   Neisseria meningitidis NOT DETECTED NOT DETECTED Final   Pseudomonas aeruginosa NOT DETECTED NOT DETECTED Final   Candida albicans NOT DETECTED NOT DETECTED Final   Candida glabrata NOT DETECTED NOT DETECTED Final   Candida krusei NOT DETECTED NOT DETECTED Final   Candida parapsilosis NOT DETECTED NOT DETECTED Final   Candida tropicalis NOT DETECTED NOT DETECTED Final    Coagulation Studies:  Recent Labs  12/24/16 1442  LABPROT 18.9*  INR 1.57    Imaging: No results found.  Assessment: 1. Left thigh and leg pain with weakness. The patient feels that her symptoms may be due to an MS flare, but is not sure. Exam shows findings suggestive of a new small left hemicord lesion, most likely due to demyelination. Other DDx includes compressive radiculopathy, but this would be unlikely as multiple adjacent nerve roots would have to be involved.  2. OSA on BIPAP. 3. Atrial fibrillation, on warfarin.  4. Implanted baclofen pump.  Recommendations: 1. Pulsed dose steroid treatment with Solumedrol 500 mg IV qd on Monday and Tueday. Has already received her first infusion, with 1000 mg. Total  of 3 infusions prior to discharge.  2. Monitor CBG as well as daily CBC and BMP while on steroids 3. Obtain an MRI of her lumbar spine with contrast if she is able to tolerate with light sedation/anxiolytic. Will need to call her Neurosurgeon's office on Monday for guidance regarding turning baclofen off prior to MRI and then back on again afterwards.  4. Continue warfarin and monitor INR.  5. BIPAP qhs, respiratory therapy to manage settings.  6. PT  Electronically signed: Dr. Kerney Elbe 12/24/2016, 8:26 PM

## 2016-12-24 NOTE — Progress Notes (Signed)
VASCULAR LAB PRELIMINARY  PRELIMINARY  PRELIMINARY  PRELIMINARY  Left lower extremity venous duplex completed.    Preliminary report:  There is no DVT or SVT noted In the left lower extremity.  Incidentally arterial flow appears adequate with normal waveforms.  Gave report to Dr. Lesia Sago, [facility], RVT 12/24/2016, 3:42 PM

## 2016-12-24 NOTE — Discharge Instructions (Signed)
Please have INR rechecked early this week.   If you were given medicines take as directed.  If you are on coumadin or contraceptives realize their levels and effectiveness is altered by many different medicines.  If you have any reaction (rash, tongues swelling, other) to the medicines stop taking and see a physician.    If your blood pressure was elevated in the ER make sure you follow up for management with a primary doctor or return for chest pain, shortness of breath or stroke symptoms.  Please follow up as directed and return to the ER or see a physician for new or worsening symptoms.  Thank you. Vitals:   12/24/16 1423 12/24/16 1433 12/24/16 1434  BP:  121/72   Pulse:  88   Resp:  18   Temp:  98.2 F (36.8 C)   TempSrc:  Oral   SpO2: 96% 96%   Weight:   265 lb (120.2 kg)  Height:   5\' 3"  (1.6 m)

## 2016-12-24 NOTE — ED Notes (Signed)
Ultrasound tech at bedside

## 2016-12-24 NOTE — ED Notes (Signed)
IV team at bedside 

## 2016-12-24 NOTE — ED Notes (Signed)
Pt requesting to speak with Dr.Zavitz. MD informed.

## 2016-12-24 NOTE — ED Provider Notes (Addendum)
[facility] DEPT Provider Note   CSN: [id number] Arrival date & time: 12/24/16  1423     History   Chief Complaint Chief Complaint  Patient presents with  . Leg Pain  . possible DVT    HPI Toni [name] is a 71 y.o. female.  Patient presents with left thigh pain that started last night. Patient has a history of DVT years ago and atrial fibrillation for which she is on Coumadin for. Patient's monitored for levels. Patient denies any sugars further chest pain. Patient does not feel this is similar to her MS flares in the past. Patient is had MS for many years and is followed outpatient. Patient has both pain and weakness with hip flexion. Patient has general weakness worse on the right at baseline. Patient uses wheelchair for most mobility.      Past Medical History:  Diagnosis Date  . Abnormality of gait 03/01/2015  . Anxiety   . Arthritis    "knees" (01/19/2016)  . Atrial fibrillation with RVR ([place]) 01/19/2016  . Childhood asthma   . Chronic pain    "nerve pain from the MS"  . Depression   . DVT (deep venous thrombosis) ([place]) 1983   "RLE; may have just been phlebitis; it was before the age of dopplers"  . 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 GERD (gastroesophageal reflux disease)   . History of stress test 06/21/10   limited exam with some degree of breast attenuatin of the apex however a component of apical ischemia is not excluded, EF 62%; cardiac cath   . HLD (hyperlipidemia)   . Hx of echocardiogram 04/22/10   EF 55-60%, no valve issues  . Hypertension   . Hypothyroidism   . Migraine    "none since I stopped beta blocker in 1990s" (01/19/2016)  . Multiple sclerosis ([place])    has had this may 1989-[facility] reg doc  . OSA on CPAP   . PAF (paroxysmal atrial fibrillation) ([place])    on coumadin; documented on monitor 06/2010  . Pneumonia 11/2011  . Shortness of breath   . Thyroid disease     Patient Active  Problem List   Diagnosis Date Noted  . Dyspnea   . Fever   . Atrial fibrillation with rapid ventricular response ([place]) 08/06/2016  . Right hand weakness 08/06/2016  . Pressure injury of skin 08/06/2016  . Nonischemic cardiomyopathy ([place]) 05/09/2016  . Localized edema 05/09/2016  . Chest pain at rest 02/03/2016  . Long term current use of anticoagulant therapy 02/03/2016  . Chest pain 01/19/2016  . Chronic atrial fibrillation ([place]) 01/19/2016  . 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 Atrial fibrillation with RVR ([place]) 01/19/2016  . Persistent atrial fibrillation with RVR ([place]) 01/19/2016  . Dysenteric diarrhea 10/04/2015  . Medication course changed 06/02/2015  . 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 Obesity hypoventilation syndrome ([place]) 03/30/2015  . Abnormality of gait 03/01/2015  . Chronic constipation with overflow incontinence 01/25/2015  . 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 Medication monitoring encounter 01/25/2015  . 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 Acute cystitis with hematuria 08/24/2014  . MS (multiple sclerosis) ([place]) 07/29/2014  . Closed right ankle fracture 10/30/2013  . Leucocytosis 10/30/2013  . Ankle fracture 10/30/2013  . Varicose veins  of both lower extremities with complications 06/04/2013  . Paroxysmal A-fib ([facility]) 06/04/2013  . Morbid obesity ([facility]) 06/04/2013  . OSA on CPAP 06/04/2013  . Muscle spasms of lower extremity 02/24/2013  . Long term (current) use of anticoagulants 11/05/2012  . 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 Aspiration pneumonia vs Failed outpatient Rx of CAP 11/26/2011  . Hypertension   . Atrial fibrillation ([facility])   . Thyroid disease   . HLD (hyperlipidemia)     Past Surgical History:  Procedure  Laterality Date  . ANTERIOR CERVICAL DECOMP/DISCECTOMY FUSION  01/2004  . BACK SURGERY    . CARDIAC CATHETERIZATION  06/22/2010   normal coronary arteries, PAF  . CATARACT EXTRACTION, BILATERAL Bilateral   . CHILD BIRTHS     X2  . DILATION AND CURETTAGE OF UTERUS    . INFUSION PUMP IMPLANTATION  ~ 2009   baclofen infusion in lower abd  . PAIN PUMP REVISION N/A 07/29/2014   Procedure: Baclofen pump replacement;  Surgeon: Joseph Stern, MD;  Location: [facility];  Service: Neurosurgery;  Laterality: N/A;  Baclofen pump replacement  . TONSILLECTOMY AND ADENOIDECTOMY    . VARICOSE VEIN SURGERY  ~ 1968  . VENOUS ABLATION  12/16/2010   radiofreq ablation -Dr Solomon and Hilty  . WISDOM TOOTH EXTRACTION      OB History    No data available       Home Medications    Prior to Admission medications   Medication Sig Start Date End Date Taking? Authorizing Provider  acetaminophen (TYLENOL) 500 MG tablet Take 500-1,000 mg by mouth every 6 (six) hours as needed for fever or headache (or pain).   Yes [provider]  ALPRAZolam (XANAX) 0.5 MG tablet Take 1 tablet (0.5 mg total) by mouth 2 (two) times daily as needed for anxiety. 08/12/16  Yes Rai, Ripudeep K, MD  baclofen (LIORESAL) 20 MG tablet Take 1 tablet (20 mg total) by mouth 3 (three) times daily as needed for muscle spasms. Patient taking differently: Take 20 mg by mouth See admin instructions. Once to twice a day for breakthrough spasms 05/04/16  Yes Dohmeier, Carmen, MD  buPROPion (WELLBUTRIN XL) 300 MG 24 hr tablet Take 300 mg by mouth daily.   Yes [provider]  CALCIUM CITRATE PO Take 2 tablets by mouth 2 (two) times daily.   Yes [provider]  cetirizine (ZYRTEC) 10 MG tablet Take 10 mg by mouth daily.   Yes [provider]  Cholecalciferol (VITAMIN D) 2000 units tablet Take 2,000 Units by mouth 2 (two) times daily.   Yes [provider]  co-enzyme Q-10 30 MG capsule Take 30 mg by  mouth daily.   Yes [provider]  corticotropin (ATHACAR H.P.) 80 UNIT/ML injectable gel Inject 80 units subcutaneously once daily for 5 consecutive days every 30 days. 09/14/16  Yes Dohmeier, Carmen, MD  diltiazem (CARDIZEM CD) 120 MG 24 hr capsule Take 1 capsule (120 mg total) by mouth daily. 05/09/16  Yes Hilty, Kenneth C, MD  furosemide (LASIX) 40 MG tablet Take 1.5 tablets (60 mg total) by mouth daily. Take 60 mg by mouth daily Patient taking differently: Take 60 mg by mouth daily. May take an additional 40 mg as needed if edema is still present 08/31/15  Yes Hilty, Kenneth C, MD  gabapentin (NEURONTIN) 300 MG capsule TAKE 1 CAPSULE BY MOUTH UP  TO 4 TIMES DAILY Patient taking differently: TAKE 1 CAPSULE BY MOUTH UP    TO 3 times daily 12/13/16  Yes Dohmeier, Carmen, MD  GENERLAC 10 GM/15ML SOLN TAKE 7.5 ML BY MOUTH TWICE DAILY 06/20/16  Yes Willis, Charles K, MD  Interferon Beta-1b (BETASERON/EXTAVIA) 0.3 MG KIT injection Every other day , subcutaneus. Please titrate pt based on normal titration schedule. 10/05/16  Yes Dohmeier, Carmen, MD  levothyroxine (SYNTHROID, LEVOTHROID) 75 MCG tablet Take 75 mcg by mouth daily.   Yes [provider]  Multiple Vitamins-Minerals (ONE-A-DAY WOMENS 50+ ADVANTAGE) TABS Take 1 tablet by mouth daily.   Yes [provider]  Nebivolol HCl (BYSTOLIC) 20 MG TABS Take 20 mg by mouth daily.   Yes [provider]  Omega-3 Fatty Acids (FISH OIL) 1000 MG CAPS Take 1,000 mg by mouth daily.   Yes [provider]  omeprazole (PRILOSEC) 20 MG capsule Take 20 mg by mouth daily.    Yes [provider]  psyllium (METAMUCIL SMOOTH TEXTURE) 28 % packet Take 1 packet by mouth every other day.   Yes [provider]  ranitidine (ZANTAC) 150 MG tablet Take 150 mg by mouth at bedtime.  12/22/12  Yes [provider]  simvastatin (ZOCOR) 20 MG tablet Take 10 mg by mouth at bedtime.    Yes [provider]   Tamsulosin HCl (FLOMAX) 0.4 MG CAPS Take 0.4 mg by mouth daily.   Yes [provider]  UNABLE TO FIND Baclofen pump: Continuous   Yes [provider]  warfarin (COUMADIN) 5 MG tablet Take 1/2 to 1 tablet by mouth daily as directed by coumadin clinic. 11/15/16  Yes Hilty, Kenneth C, MD  docusate sodium (COLACE) 100 MG capsule Take 1 capsule (100 mg total) by mouth daily. Patient not taking: Reported on 12/24/2016 08/12/16   Rai, Ripudeep K, MD  fluticasone (FLONASE) 50 MCG/ACT nasal spray Place 2 sprays into both nostrils daily as needed for allergies.  03/23/15   [provider]  SYRINGE/NEEDLE, DISP, 1 ML (BD LUER-LOK SYRINGE) 25G X 5/8" 1 ML MISC Use as directed to inject  [place] 06/15/15   Dohmeier, Carmen, MD  warfarin (COUMADIN) 3 MG tablet Take 1 - 2 tablet by mouth daily or as directed by coumadin clinic. Patient not taking: Reported on 12/24/2016 09/13/16   Hilty, Kenneth C, MD    Family History Family History  Problem Relation Age of Onset  . Coronary artery disease Father     at age 82  . Coronary artery disease Maternal Grandmother   . Depression Maternal Grandmother   . Cancer Paternal Grandmother   . Depression Mother   . Alcoholism Son     Social History Social History  Substance Use Topics  . Smoking status: Former Smoker    Packs/day: 0.50    Years: 7.00    Types: Cigarettes    Quit date: 08/21/1976  . Smokeless tobacco: Never Used  . Alcohol use 0.6 oz/week    1 Glasses of wine per week     Allergies   Hydrocodone; Oxycodone; Chlorhexidine gluconate; and Zoloft [sertraline]   Review of Systems Review of Systems  Constitutional: Negative for chills and fever.  HENT: Negative for congestion.   Eyes: Negative for visual disturbance.  Respiratory: Negative for shortness of breath.   Cardiovascular: Negative for chest pain.  Gastrointestinal: Negative for abdominal pain and vomiting.  Genitourinary: Negative for dysuria and flank pain.   Musculoskeletal: Positive for arthralgias. Negative for back pain, neck pain and neck stiffness.  Skin: Negative for rash.  Neurological: Negative for light-headedness   and headaches.     Physical Exam Updated Vital Signs BP 126/70 (BP Location: Left Arm)   Pulse 82   Temp 98.2 F (36.8 C) (Oral)   Resp 17   Ht 5' 3" (1.6 m)   Wt 265 lb (120.2 kg)   SpO2 98%   BMI 46.94 kg/m   Physical Exam  Constitutional: She is oriented to person, place, and time. She appears well-developed and well-nourished.  HENT:  Head: Normocephalic and atraumatic.  Eyes: Conjunctivae are normal. Right eye exhibits no discharge. Left eye exhibits no discharge.  Neck: Normal range of motion. Neck supple. No tracheal deviation present.  Cardiovascular: Normal rate, regular rhythm and intact distal pulses.   Pulmonary/Chest: Effort normal and breath sounds normal.  Abdominal: Soft. She exhibits no distension. There is no tenderness. There is no guarding.  Musculoskeletal: She exhibits tenderness. She exhibits no edema.  Neurological: She is alert and oriented to person, place, and time. No cranial nerve deficit or sensory deficit.  Reflex Scores:      Patellar reflexes are 1+ on the left side.      Achilles reflexes are 1+ on the right side and 1+ on the left side. Gen. weakness bilateral. Patient has pain and decreased strength with hip flexion the left. Patient has equal 5+ strength with great toe dorsiflexion and plantar flexion and knee flexion extension bilateral. Sensation intact bilateral palpation.  Skin: Skin is warm. No rash noted.  Psychiatric: She has a normal mood and affect.  Nursing note and vitals reviewed.    ED Treatments / Results  Labs (all labs ordered are listed, but only abnormal results are displayed) Labs Reviewed  COMPREHENSIVE METABOLIC PANEL - Abnormal; Notable for the following:       Result Value   Total Protein 6.4 (*)    All other components within normal limits   PROTIME-INR - Abnormal; Notable for the following:    Prothrombin Time 18.9 (*)    All other components within normal limits  CBC WITH DIFFERENTIAL/PLATELET    EKG  EKG Interpretation None       Radiology No results found.  Procedures Procedures (including critical care time)  Medications Ordered in ED Medications  enoxaparin (LOVENOX) injection 120 mg (not administered)     Initial Impression / Assessment and Plan / ED Course  I have reviewed the triage vital signs and the nursing notes.  Pertinent labs & imaging results that were available during my care of the patient were reviewed by me and considered in my medical decision making (see chart for details).    Patient presents with primarily pain with hip flexion on the left without injury. No sign of infection on exam, no sign of acute arterial pathology. Patient ultrasound done showing excellent waveforms and no DVT. On reassessment for discharge patient felt it now had more weakness component. Discussed with neurology on the phone who agreed with MRI with and without contrast for further delineation. Tylenol ordered.  Discussed MRIs with patient who said she absolutely cannot tolerate MRIs. She has been treated with steroids in the past based on clinical and is comfortable with this. Patient will follow closely outpatient. Discussed with neurology who recommended a Medrol Dosepak.  Upon discharge patient stated she could not go home as she has to much pain and weakness to do her normal daily activities. Discussed the risks and benefits of coming in the hospital and she does have a wheelchair at home however she has needed inpatient for   MS flares in the past, physical therapy, IV steroids. Repeat page neurology recommended 1 g Solu-Medrol and hospitalist admission.  The patients results and plan were reviewed and discussed.   Any x-rays performed were independently reviewed by myself.   Differential diagnosis were  considered with the presenting HPI.  Medications  methylPREDNISolone sodium succinate (SOLU-MEDROL) 1,000 mg in sodium chloride 0.9 % 50 mL IVPB (not administered)  enoxaparin (LOVENOX) injection 120 mg (120 mg Subcutaneous Given 12/24/16 1632)  acetaminophen (TYLENOL) tablet 1,000 mg (1,000 mg Oral Given 12/24/16 1733)    Vitals:   12/24/16 1423 12/24/16 1433 12/24/16 1434 12/24/16 1623  BP:  121/72  126/70  Pulse:  88  82  Resp:  18  17  Temp:  98.2 F (36.8 C)    TempSrc:  Oral    SpO2: 96% 96%  98%  Weight:   265 lb (120.2 kg)   Height:   5' 3" (1.6 m)     Final diagnoses:  Left leg pain  Subtherapeutic anticoagulation  Left leg weakness    Admission/ observation were discussed with the admitting physician, patient and/or family and they are comfortable with the plan.    Final Clinical Impressions(s) / ED Diagnoses   Final diagnoses:  Left leg pain  Subtherapeutic anticoagulation    New Prescriptions New Prescriptions   No medications on file     Elnora Morrison, MD 12/24/16 1704    Elnora Morrison, MD 12/31/16 [phone]

## 2016-12-24 NOTE — Progress Notes (Signed)
ANTICOAGULATION CONSULT NOTE - Initial Consult  Pharmacy Consult for warfarin  Indication: atrial fibrillation  Allergies  Allergen Reactions  . Hydrocodone Nausea And Vomiting    Hydrocodone causes vomiting  . Oxycodone Nausea And Vomiting    Oral oxycodone causes vomiting  . Chlorhexidine Gluconate Rash    Sores at site  . Zoloft [Sertraline] Hives, Swelling and Rash    Patient Measurements: Height: 5\' 3"  (160 cm) Weight: 271 lb (122.9 kg) IBW/kg (Calculated) : 52.4   Vital Signs: Temp: 98.5 F (36.9 C) (05/06 2030) Temp Source: Oral (05/06 2030) BP: 134/64 (05/06 2030) Pulse Rate: 82 (05/06 2030)  Labs:  Recent Labs  12/24/16 1442  HGB 14.3  HCT 43.9  PLT 168  LABPROT 18.9*  INR 1.57  CREATININE 0.83    Estimated Creatinine Clearance: 81.4 mL/min (by C-G formula based on SCr of 0.83 mg/dL).   Medical History: Past Medical History:  Diagnosis Date  . Abnormality of gait 03/01/2015  . Anxiety   . Arthritis    "knees" (01/19/2016)  . Atrial fibrillation with RVR ([place]) 01/19/2016  . Childhood asthma   . Chronic pain    "nerve pain from the MS"  . Depression   . DVT (deep venous thrombosis) ([place]) 1983   "RLE; may have just been phlebitis; it was before the age of dopplers"  . 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 GERD (gastroesophageal reflux disease)   . History of stress test 06/21/10   limited exam with some degree of breast attenuatin of the apex however a component of apical ischemia is not excluded, EF 62%; cardiac cath   . HLD (hyperlipidemia)   . Hx of echocardiogram 04/22/10   EF 55-60%, no valve issues  . Hypertension   . Hypothyroidism   . Migraine    "none since I stopped beta blocker in 1990s" (01/19/2016)  . Multiple sclerosis ([place])    has had this may 1989-[facility] reg doc  . OSA on CPAP   . PAF (paroxysmal atrial fibrillation) ([place])    on coumadin; documented on monitor 06/2010  .  Pneumonia 11/2011  . Shortness of breath   . Thyroid disease     Assessment: 71 yo female on warfarin prior to arrival for hx atrial fibrillation admitted with leg pain and concern for DVT. Doppler negative in ED. INR on admission 1.57 with last dose taken on 5/5.  Home dose of warfarin recently decreased to 5 mg on Sunday, Tuesday and Thursday and 2.5 mg AOD's. Pt is noted to have held 5/3 dose of warfarin due INR of 3.6 in outpatient setting. CBC stable.   Goal of Therapy:  INR 2-3 Monitor platelets by anticoagulation protocol: Yes   Plan:  1. Warfarin 5 mg x 1 this evening 2. Daily INR  Duayne Cal 12/24/2016,9:03 PM

## 2016-12-24 NOTE — H&P (Signed)
History and Physical    Toni [name] EXH:[id number] DOB: Dec 14, 1945 DOA: 12/24/2016  PCP: Prince Solian, MD  Patient coming from:  home  Chief Complaint:   Leg pain and weakness  HPI: Toni [name] is a 71 y.o. female with medical history significant of MO, OSA cpap qhs, MS since 1989, afib mostly wheelchair bound at home comes in with left leg groin pain down the leg with weakness.  She reports this does not feel like a flare of her MS.  Hurts more with movement.  There was concern she had a DVT however u/s is neg for that.  She was in the process of being discharged but she did not want to go home.  She denies any fevers.  No n/v/d.  No urinary symptoms.  No sob or chest pain.  Neurology was called who suggested MRI to assess if this was MS flare, but patient cannot do MRI due to claustrophobia.  Neuro then recommended iv steroids and observation stay.  Pt is therefore referred for admission for possible MS flare.  No trauma or falls.   Review of Systems: As per HPI otherwise 10 point review of systems negative.   Past Medical History:  Diagnosis Date  . Abnormality of gait 03/01/2015  . Anxiety   . Arthritis    "knees" (01/19/2016)  . Atrial fibrillation with RVR ([place]) 01/19/2016  . Childhood asthma   . Chronic pain    "nerve pain from the MS"  . Depression   . DVT (deep venous thrombosis) ([place]) 1983   "RLE; may have just been phlebitis; it was before the age of dopplers"  . 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 GERD (gastroesophageal reflux disease)   . History of stress test 06/21/10   limited exam with some degree of breast attenuatin of the apex however a component of apical ischemia is not excluded, EF 62%; cardiac cath   . HLD (hyperlipidemia)   . Hx of echocardiogram 04/22/10   EF 55-60%, no valve issues  . Hypertension   . Hypothyroidism   . Migraine    "none since I stopped beta blocker in 1990s" (01/19/2016)    . Multiple sclerosis ([place])    has had this may 1989-[facility] reg doc  . OSA on CPAP   . PAF (paroxysmal atrial fibrillation) ([place])    on coumadin; documented on monitor 06/2010  . Pneumonia 11/2011  . Shortness of breath   . Thyroid disease     Past Surgical History:  Procedure Laterality Date  . ANTERIOR CERVICAL DECOMP/DISCECTOMY FUSION  01/2004  . BACK SURGERY    . CARDIAC CATHETERIZATION  06/22/2010   normal coronary arteries, PAF  . CATARACT EXTRACTION, BILATERAL Bilateral   . CHILD BIRTHS     X2  . DILATION AND CURETTAGE OF UTERUS    . INFUSION PUMP IMPLANTATION  ~ 2009   baclofen infusion in lower abd  . PAIN PUMP REVISION N/A 07/29/2014   Procedure: Baclofen pump replacement;  Surgeon: Erline Levine, MD;  Location: [facility];  Service: Neurosurgery;  Laterality: N/A;  Baclofen pump replacement  . TONSILLECTOMY AND ADENOIDECTOMY    . VARICOSE VEIN SURGERY  ~ 1968  . VENOUS ABLATION  12/16/2010   radiofreq ablation -Dr Elisabeth Cara and [facility]  . WISDOM TOOTH EXTRACTION       reports that she quit smoking about 40 years ago. Her smoking use  included Cigarettes. She has a 3.50 pack-year smoking history. She has never used smokeless tobacco. She reports that she drinks about 0.6 oz of alcohol per week . She reports that she does not use drugs.  Allergies  Allergen Reactions  . Hydrocodone Nausea And Vomiting    Hydrocodone causes vomiting  . Oxycodone Nausea And Vomiting    Oral oxycodone causes vomiting  . Chlorhexidine Gluconate Rash    Sores at site  . Zoloft [Sertraline] Hives, Swelling and Rash    Family History  Problem Relation Age of Onset  . Coronary artery disease Father     at age 30  . Coronary artery disease Maternal Grandmother   . Depression Maternal Grandmother   . Cancer Paternal Grandmother   . Depression Mother   . Alcoholism Son     Prior to Admission medications   Medication Sig Start Date End Date Taking? Authorizing Provider  acetaminophen  (TYLENOL) 500 MG tablet Take 500-1,000 mg by mouth every 6 (six) hours as needed for fever or headache (or pain).   Yes [provider]  ALPRAZolam (XANAX) 0.5 MG tablet Take 1 tablet (0.5 mg total) by mouth 2 (two) times daily as needed for anxiety. 08/12/16  Yes Rai, Ripudeep K, MD  baclofen (LIORESAL) 20 MG tablet Take 1 tablet (20 mg total) by mouth 3 (three) times daily as needed for muscle spasms. Patient taking differently: Take 20 mg by mouth See admin instructions. Once to twice a day for breakthrough spasms 05/04/16  Yes Dohmeier, Asencion Partridge, MD  buPROPion (WELLBUTRIN XL) 300 MG 24 hr tablet Take 300 mg by mouth daily.   Yes [provider]  CALCIUM CITRATE PO Take 2 tablets by mouth 2 (two) times daily.   Yes [provider]  cetirizine (ZYRTEC) 10 MG tablet Take 10 mg by mouth daily.   Yes [provider]  Cholecalciferol (VITAMIN D) 2000 units tablet Take 2,000 Units by mouth 2 (two) times daily.   Yes [provider]  co-enzyme Q-10 30 MG capsule Take 30 mg by mouth daily.   Yes [provider]  corticotropin (ATHACAR H.P.) 80 UNIT/ML injectable gel Inject 80 units subcutaneously once daily for 5 consecutive days every 30 days. 09/14/16  Yes Dohmeier, Asencion Partridge, MD  diltiazem (CARDIZEM CD) 120 MG 24 hr capsule Take 1 capsule (120 mg total) by mouth daily. 05/09/16  Yes Hilty, Nadean Corwin, MD  furosemide (LASIX) 40 MG tablet Take 1.5 tablets (60 mg total) by mouth daily. Take 60 mg by mouth daily Patient taking differently: Take 60 mg by mouth daily. May take an additional 40 mg as needed if edema is still present 08/31/15  Yes Hilty, Nadean Corwin, MD  gabapentin (NEURONTIN) 300 MG capsule TAKE 1 CAPSULE BY MOUTH UP  TO 4 TIMES DAILY Patient taking differently: TAKE 1 CAPSULE BY MOUTH UP  TO 3 times daily 12/13/16  Yes Dohmeier, Asencion Partridge, MD  [facility] 10 GM/15ML SOLN TAKE 7.5 ML BY MOUTH TWICE DAILY 06/20/16  Yes Kathrynn Ducking, MD  Interferon  Beta-1b (BETASERON/EXTAVIA) 0.3 MG KIT injection Every other day , subcutaneus. Please titrate pt based on normal titration schedule. 10/05/16  Yes Dohmeier, Asencion Partridge, MD  levothyroxine (SYNTHROID, LEVOTHROID) 75 MCG tablet Take 75 mcg by mouth daily.   Yes [provider]  Multiple Vitamins-Minerals (ONE-A-DAY WOMENS 50+ ADVANTAGE) TABS Take 1 tablet by mouth daily.   Yes [provider]  Nebivolol HCl (BYSTOLIC) 20 MG TABS Take 20 mg by mouth daily.  Yes [provider]  Omega-3 Fatty Acids (FISH OIL) 1000 MG CAPS Take 1,000 mg by mouth daily.   Yes [provider]  omeprazole (PRILOSEC) 20 MG capsule Take 20 mg by mouth daily.    Yes [provider]  psyllium (METAMUCIL SMOOTH TEXTURE) 28 % packet Take 1 packet by mouth every other day.   Yes [provider]  ranitidine (ZANTAC) 150 MG tablet Take 150 mg by mouth at bedtime.  12/22/12  Yes [provider]  simvastatin (ZOCOR) 20 MG tablet Take 10 mg by mouth at bedtime.    Yes [provider]  Tamsulosin HCl (FLOMAX) 0.4 MG CAPS Take 0.4 mg by mouth daily.   Yes [provider]  UNABLE TO FIND Baclofen pump: Continuous   Yes [provider]  warfarin (COUMADIN) 5 MG tablet Take 1/2 to 1 tablet by mouth daily as directed by coumadin clinic. 11/15/16  Yes Hilty, Nadean Corwin, MD  docusate sodium (COLACE) 100 MG capsule Take 1 capsule (100 mg total) by mouth daily. Patient not taking: Reported on 12/24/2016 08/12/16   Rai, Vernelle Emerald, MD  fluticasone (FLONASE) 50 MCG/ACT nasal spray Place 2 sprays into both nostrils daily as needed for allergies.  03/23/15   [provider]  methylPREDNISolone (MEDROL DOSEPAK) 4 MG TBPK tablet Day 1: Two tablets before breakfast, one after lunch, one after dinner, and two at bedtime. Day 2: One tablet before breakfast, one after lunch, one after dinner, and two at bedtime Day 3: One tablet before breakfast, one after lunch, one  after dinner, and one at bedtime Day 4: One tablet before breakfast, one after lunch, and one at bedtime Day 5: One tablet before breakfast and one at bedtime Day 6: One tablet before breakfast 12/24/16   Elnora Morrison, MD  SYRINGE/NEEDLE, DISP, 1 ML (BD LUER-LOK SYRINGE) 25G X 5/8" 1 ML MISC Use as directed to inject  [place] 06/15/15   Dohmeier, Asencion Partridge, MD  warfarin (COUMADIN) 3 MG tablet Take 1 - 2 tablet by mouth daily or as directed by coumadin clinic. Patient not taking: Reported on 12/24/2016 09/13/16   Pixie Casino, MD    Physical Exam: Vitals:   12/24/16 1423 12/24/16 1433 12/24/16 1434 12/24/16 1623  BP:  121/72  126/70  Pulse:  88  82  Resp:  18  17  Temp:  98.2 F (36.8 C)    TempSrc:  Oral    SpO2: 96% 96%  98%  Weight:   120.2 kg (265 lb)   Height:   '5\' 3"'  (1.6 m)       Constitutional: NAD, calm, comfortable Vitals:   12/24/16 1423 12/24/16 1433 12/24/16 1434 12/24/16 1623  BP:  121/72  126/70  Pulse:  88  82  Resp:  18  17  Temp:  98.2 F (36.8 C)    TempSrc:  Oral    SpO2: 96% 96%  98%  Weight:   120.2 kg (265 lb)   Height:   '5\' 3"'  (1.6 m)    Eyes: PERRL, lids and conjunctivae normal ENMT: Mucous membranes are moist. Posterior pharynx clear of any exudate or lesions.Normal dentition.  Neck: normal, supple, no masses, no thyromegaly Respiratory: clear to auscultation bilaterally, no wheezing, no crackles. Normal respiratory effort. No accessory muscle use.  Cardiovascular: Regular rate and rhythm, no murmurs / rubs / gallops. No extremity edema. 2+ pedal pulses. No carotid bruits.  Abdomen: no tenderness, no masses palpated. No hepatosplenomegaly. Bowel sounds positive.  Musculoskeletal: no clubbing / cyanosis. No joint deformity upper and lower extremities. Good ROM, no contractures. Normal muscle tone. Pain with  Movement of left hip with internal/ext rotation and flex/ext of hip Skin: no rashes, lesions, ulcers. No induration Neurologic: CN 2-12 grossly  intact. Sensation intact, DTR normal. Strength 5/5 in all 4.  Psychiatric: Normal judgment and insight. Alert and oriented x 3. Normal mood.    Labs on Admission: I have personally reviewed following labs and imaging studies  CBC:  Recent Labs Lab 12/24/16 1442  WBC 5.2  NEUTROABS 2.9  HGB 14.3  HCT 43.9  MCV 91.8  PLT 972   Basic Metabolic Panel:  Recent Labs Lab 12/24/16 1442  NA 139  K 4.0  CL 102  CO2 29  GLUCOSE 90  BUN 18  CREATININE 0.83  CALCIUM 9.4   GFR: Estimated Creatinine Clearance: 80.3 mL/min (by C-G formula based on SCr of 0.83 mg/dL). Liver Function Tests:  Recent Labs Lab 12/24/16 1442  AST 19  ALT 19  ALKPHOS 56  BILITOT 0.6  PROT 6.4*  ALBUMIN 3.6   Coagulation Profile:  Recent Labs Lab 12/21/16 12/24/16 1442  INR 3.6* 1.57   Urine analysis:    Component Value Date/Time   COLORURINE YELLOW 08/06/2016 0243   APPEARANCEUR CLEAR 08/06/2016 0243   APPEARANCEUR Clear 12/18/2014 1156   LABSPEC 1.028 08/06/2016 0243   PHURINE 5.0 08/06/2016 0243   GLUCOSEU NEGATIVE 08/06/2016 0243   HGBUR NEGATIVE 08/06/2016 0243   BILIRUBINUR NEGATIVE 08/06/2016 0243   BILIRUBINUR Negative 12/18/2014 1156   KETONESUR NEGATIVE 08/06/2016 0243   PROTEINUR NEGATIVE 08/06/2016 0243   UROBILINOGEN 0.2 08/05/2014 0951   NITRITE NEGATIVE 08/06/2016 0243   LEUKOCYTESUR NEGATIVE 08/06/2016 0243   LEUKOCYTESUR 1+ (A) 12/18/2014 1156   Assessment/Plan 71 yo female with multiple medical issues comes in with left leg/hip pain neg for DVT with h/o MS  Principal Problem:   Weakness and pain to left leg - this sounds atypical for multiple sclerosis flare, however neuro has recommended iv steroids, will leave that management to neuro team.  She has received 1079m solumedrol in ED.  Ultrasound neg for DVT.  I will obtain xrays of pelvis and left hip to r/o bony/fractures, etc. As etiology of her pain  Active Problems:   Multiple sclerosis, primary chronic  progressive-diag 1989-Ab to IFN- per  Neurology team   HLD (hyperlipidemia)- cont statin   Long term (current) use of anticoagulants- pharm to manage coumadin, consult requested   Morbid obesity ([place]- noted   OSA on CPAP- ordered this qhs   Abnormality of gait- baseline status is mostly wheelchair bound   Obesity hypoventilation syndrome ([place]- noted   Combined systolic and diastolic congestive heart failure ([place]- this is stable and compensated at this time, no ivf needed at this time   Med rec pending completion Await recs from neurology team Follow up on hip xrays    DVT prophylaxis:  scds Code Status:  full Family Communication:  none Disposition Plan:  Per day team Consults called:  neurology Admission status:  Observation status   Hila Bolding A MD [facility]  If 7PM-7AM, please contact night-coverage [email or web address] Password [email or web address]  12/24/2016, 6:59 PM

## 2016-12-24 NOTE — ED Triage Notes (Signed)
Pt to ED via [facility] -- to triage -- pt is normally non ambulatory-- due to MS-- placed in w/c by transferring with sheet--  Pt c/o left leg pain that started last night -- concerned that she is has developed DVT- hx of same on right leg-- pt had coumadin level checked on Thursday-- it was high-- skipped Thursday's dose, 1/2 on Friday, 1/2 on Saturday.  Pt has hx of MS-- is normally wheelchair bound.

## 2016-12-24 NOTE — ED Notes (Signed)
ED Provider at bedside. 

## 2016-12-24 NOTE — Progress Notes (Signed)
Patient admitted into 5M18 through ER. On arrival patient verbal and oriented. Husband at the bedside. Made comfortable and orders being carried out. Will keep monitoring.

## 2016-12-25 ENCOUNTER — Inpatient Hospital Stay (HOSPITAL_COMMUNITY): Payer: Medicare Other

## 2016-12-25 DIAGNOSIS — I110 Hypertensive heart disease with heart failure: Secondary | ICD-10-CM | POA: Diagnosis present

## 2016-12-25 DIAGNOSIS — Z981 Arthrodesis status: Secondary | ICD-10-CM | POA: Diagnosis not present

## 2016-12-25 DIAGNOSIS — I10 Essential (primary) hypertension: Secondary | ICD-10-CM | POA: Diagnosis not present

## 2016-12-25 DIAGNOSIS — G35 Multiple sclerosis: Secondary | ICD-10-CM | POA: Diagnosis present

## 2016-12-25 DIAGNOSIS — G8929 Other chronic pain: Secondary | ICD-10-CM | POA: Diagnosis not present

## 2016-12-25 DIAGNOSIS — Z87891 Personal history of nicotine dependence: Secondary | ICD-10-CM | POA: Diagnosis not present

## 2016-12-25 DIAGNOSIS — G9340 Encephalopathy, unspecified: Secondary | ICD-10-CM | POA: Diagnosis not present

## 2016-12-25 DIAGNOSIS — E119 Type 2 diabetes mellitus without complications: Secondary | ICD-10-CM | POA: Diagnosis not present

## 2016-12-25 DIAGNOSIS — J029 Acute pharyngitis, unspecified: Secondary | ICD-10-CM | POA: Diagnosis not present

## 2016-12-25 DIAGNOSIS — F329 Major depressive disorder, single episode, unspecified: Secondary | ICD-10-CM | POA: Diagnosis present

## 2016-12-25 DIAGNOSIS — Z7901 Long term (current) use of anticoagulants: Secondary | ICD-10-CM | POA: Diagnosis not present

## 2016-12-25 DIAGNOSIS — R05 Cough: Secondary | ICD-10-CM | POA: Diagnosis not present

## 2016-12-25 DIAGNOSIS — I4891 Unspecified atrial fibrillation: Secondary | ICD-10-CM | POA: Diagnosis not present

## 2016-12-25 DIAGNOSIS — F40240 Claustrophobia: Secondary | ICD-10-CM | POA: Diagnosis present

## 2016-12-25 DIAGNOSIS — Z6841 Body Mass Index (BMI) 40.0 and over, adult: Secondary | ICD-10-CM | POA: Diagnosis not present

## 2016-12-25 DIAGNOSIS — K5900 Constipation, unspecified: Secondary | ICD-10-CM | POA: Diagnosis not present

## 2016-12-25 DIAGNOSIS — Z86718 Personal history of other venous thrombosis and embolism: Secondary | ICD-10-CM | POA: Diagnosis not present

## 2016-12-25 DIAGNOSIS — I5042 Chronic combined systolic (congestive) and diastolic (congestive) heart failure: Secondary | ICD-10-CM | POA: Diagnosis present

## 2016-12-25 DIAGNOSIS — Z79899 Other long term (current) drug therapy: Secondary | ICD-10-CM | POA: Diagnosis not present

## 2016-12-25 DIAGNOSIS — R52 Pain, unspecified: Secondary | ICD-10-CM | POA: Diagnosis not present

## 2016-12-25 DIAGNOSIS — I5043 Acute on chronic combined systolic (congestive) and diastolic (congestive) heart failure: Secondary | ICD-10-CM | POA: Diagnosis not present

## 2016-12-25 DIAGNOSIS — R278 Other lack of coordination: Secondary | ICD-10-CM | POA: Diagnosis not present

## 2016-12-25 DIAGNOSIS — M48061 Spinal stenosis, lumbar region without neurogenic claudication: Secondary | ICD-10-CM | POA: Diagnosis not present

## 2016-12-25 DIAGNOSIS — F419 Anxiety disorder, unspecified: Secondary | ICD-10-CM | POA: Diagnosis not present

## 2016-12-25 DIAGNOSIS — R0602 Shortness of breath: Secondary | ICD-10-CM | POA: Diagnosis not present

## 2016-12-25 DIAGNOSIS — R531 Weakness: Secondary | ICD-10-CM | POA: Diagnosis not present

## 2016-12-25 DIAGNOSIS — G4730 Sleep apnea, unspecified: Secondary | ICD-10-CM | POA: Diagnosis not present

## 2016-12-25 DIAGNOSIS — E785 Hyperlipidemia, unspecified: Secondary | ICD-10-CM | POA: Diagnosis present

## 2016-12-25 DIAGNOSIS — E662 Morbid (severe) obesity with alveolar hypoventilation: Secondary | ICD-10-CM | POA: Diagnosis not present

## 2016-12-25 DIAGNOSIS — Z9989 Dependence on other enabling machines and devices: Secondary | ICD-10-CM | POA: Diagnosis not present

## 2016-12-25 DIAGNOSIS — M6281 Muscle weakness (generalized): Secondary | ICD-10-CM | POA: Diagnosis not present

## 2016-12-25 DIAGNOSIS — Z993 Dependence on wheelchair: Secondary | ICD-10-CM | POA: Diagnosis not present

## 2016-12-25 DIAGNOSIS — E569 Vitamin deficiency, unspecified: Secondary | ICD-10-CM | POA: Diagnosis not present

## 2016-12-25 DIAGNOSIS — R269 Unspecified abnormalities of gait and mobility: Secondary | ICD-10-CM | POA: Diagnosis not present

## 2016-12-25 DIAGNOSIS — R609 Edema, unspecified: Secondary | ICD-10-CM | POA: Diagnosis not present

## 2016-12-25 DIAGNOSIS — K219 Gastro-esophageal reflux disease without esophagitis: Secondary | ICD-10-CM | POA: Diagnosis present

## 2016-12-25 DIAGNOSIS — E039 Hypothyroidism, unspecified: Secondary | ICD-10-CM | POA: Diagnosis present

## 2016-12-25 DIAGNOSIS — R1010 Upper abdominal pain, unspecified: Secondary | ICD-10-CM | POA: Diagnosis not present

## 2016-12-25 DIAGNOSIS — R29898 Other symptoms and signs involving the musculoskeletal system: Secondary | ICD-10-CM | POA: Diagnosis not present

## 2016-12-25 DIAGNOSIS — I480 Paroxysmal atrial fibrillation: Secondary | ICD-10-CM | POA: Diagnosis present

## 2016-12-25 DIAGNOSIS — T7840XD Allergy, unspecified, subsequent encounter: Secondary | ICD-10-CM | POA: Diagnosis not present

## 2016-12-25 DIAGNOSIS — M6250 Muscle wasting and atrophy, not elsewhere classified, unspecified site: Secondary | ICD-10-CM | POA: Diagnosis not present

## 2016-12-25 DIAGNOSIS — M79662 Pain in left lower leg: Secondary | ICD-10-CM | POA: Diagnosis present

## 2016-12-25 DIAGNOSIS — I509 Heart failure, unspecified: Secondary | ICD-10-CM | POA: Diagnosis not present

## 2016-12-25 DIAGNOSIS — G4733 Obstructive sleep apnea (adult) (pediatric): Secondary | ICD-10-CM | POA: Diagnosis present

## 2016-12-25 DIAGNOSIS — M48062 Spinal stenosis, lumbar region with neurogenic claudication: Secondary | ICD-10-CM | POA: Diagnosis not present

## 2016-12-25 DIAGNOSIS — M79605 Pain in left leg: Secondary | ICD-10-CM | POA: Diagnosis not present

## 2016-12-25 LAB — GLUCOSE, CAPILLARY
GLUCOSE-CAPILLARY: 153 mg/dL — AB (ref 65–99)
GLUCOSE-CAPILLARY: 188 mg/dL — AB (ref 65–99)
GLUCOSE-CAPILLARY: 159 mg/dL — AB (ref 65–99)
GLUCOSE-CAPILLARY: 167 mg/dL — AB (ref 65–99)

## 2016-12-25 LAB — URINALYSIS, ROUTINE W REFLEX MICROSCOPIC
Bilirubin Urine: NEGATIVE
Glucose, UA: NEGATIVE mg/dL
Hgb urine dipstick: NEGATIVE
KETONES UR: NEGATIVE mg/dL
LEUKOCYTES UA: NEGATIVE
NITRITE: NEGATIVE
PH: 6 (ref 5.0–8.0)
Protein, ur: NEGATIVE mg/dL
Specific Gravity, Urine: 1.005 (ref 1.005–1.030)

## 2016-12-25 LAB — CBC
HEMATOCRIT: 41.3 % (ref 36.0–46.0)
Hemoglobin: 14 g/dL (ref 12.0–15.0)
MCH: 30.2 pg (ref 26.0–34.0)
MCHC: 33.9 g/dL (ref 30.0–36.0)
MCV: 89 fL (ref 78.0–100.0)
Platelets: 165 10*3/uL (ref 150–400)
RBC: 4.64 MIL/uL (ref 3.87–5.11)
RDW: 12.2 % (ref 11.5–15.5)
WBC: 4.7 10*3/uL (ref 4.0–10.5)

## 2016-12-25 LAB — BASIC METABOLIC PANEL
Anion gap: 10 (ref 5–15)
BUN: 16 mg/dL (ref 6–20)
CHLORIDE: 100 mmol/L — AB (ref 101–111)
CO2: 26 mmol/L (ref 22–32)
Calcium: 8.8 mg/dL — ABNORMAL LOW (ref 8.9–10.3)
Creatinine, Ser: 0.64 mg/dL (ref 0.44–1.00)
GFR calc Af Amer: >60 mL/min (ref >60)
GFR calc non Af Amer: >60 mL/min (ref >60)
Glucose, Bld: 168 mg/dL — ABNORMAL HIGH (ref 65–99)
POTASSIUM: 3.8 mmol/L (ref 3.5–5.1)
SODIUM: 136 mmol/L (ref 135–145)

## 2016-12-25 LAB — PROTIME-INR
INR: 1.71
Prothrombin Time: 20.3 seconds — ABNORMAL HIGH (ref 11.4–15.2)

## 2016-12-25 MED ORDER — WARFARIN SODIUM 5 MG PO TABS
5.0000 mg | ORAL_TABLET | Freq: Once | ORAL | Status: AC
  Administered 2016-12-25: 5 mg via ORAL
  Filled 2016-12-25: qty 1

## 2016-12-25 MED ORDER — SIMVASTATIN 5 MG PO TABS
10.0000 mg | ORAL_TABLET | Freq: Every day | ORAL | Status: DC
  Administered 2016-12-25 – 2016-12-27 (×4): 10 mg via ORAL
  Filled 2016-12-25 (×3): qty 2

## 2016-12-25 MED ORDER — GABAPENTIN 300 MG PO CAPS
300.0000 mg | ORAL_CAPSULE | Freq: Four times a day (QID) | ORAL | Status: DC
  Administered 2016-12-25: 300 mg via ORAL
  Filled 2016-12-25: qty 1

## 2016-12-25 MED ORDER — DOCUSATE SODIUM 100 MG PO CAPS
100.0000 mg | ORAL_CAPSULE | Freq: Every day | ORAL | Status: DC
Start: 2016-12-25 — End: 2016-12-26
  Administered 2016-12-25: 100 mg via ORAL
  Filled 2016-12-25: qty 1

## 2016-12-25 MED ORDER — SODIUM CHLORIDE 0.9 % IV SOLN
500.0000 mg | INTRAVENOUS | Status: AC
  Administered 2016-12-25 – 2016-12-26 (×2): 500 mg via INTRAVENOUS
  Filled 2016-12-25 (×2): qty 4

## 2016-12-25 MED ORDER — LORATADINE 10 MG PO TABS
10.0000 mg | ORAL_TABLET | Freq: Every day | ORAL | Status: DC
  Administered 2016-12-25 – 2016-12-28 (×4): 10 mg via ORAL
  Filled 2016-12-25 (×4): qty 1

## 2016-12-25 MED ORDER — FLUTICASONE PROPIONATE 50 MCG/ACT NA SUSP
2.0000 | Freq: Every day | NASAL | Status: DC | PRN
  Administered 2016-12-25: 2 via NASAL
  Filled 2016-12-25: qty 16

## 2016-12-25 MED ORDER — LACTULOSE 10 GM/15ML PO SOLN
10.0000 g | Freq: Two times a day (BID) | ORAL | Status: DC
  Administered 2016-12-25 (×2): 10 g via ORAL
  Filled 2016-12-25 (×2): qty 15

## 2016-12-25 MED ORDER — ACETAMINOPHEN 500 MG PO TABS
1000.0000 mg | ORAL_TABLET | Freq: Four times a day (QID) | ORAL | Status: DC | PRN
  Administered 2016-12-25 – 2016-12-27 (×8): 1000 mg via ORAL
  Filled 2016-12-25 (×8): qty 2

## 2016-12-25 MED ORDER — GABAPENTIN 300 MG PO CAPS
300.0000 mg | ORAL_CAPSULE | Freq: Three times a day (TID) | ORAL | Status: DC
  Administered 2016-12-25 – 2016-12-28 (×11): 300 mg via ORAL
  Filled 2016-12-25 (×11): qty 1

## 2016-12-25 MED ORDER — PANTOPRAZOLE SODIUM 40 MG PO TBEC
40.0000 mg | DELAYED_RELEASE_TABLET | Freq: Every day | ORAL | Status: DC
  Administered 2016-12-25 – 2016-12-28 (×4): 40 mg via ORAL
  Filled 2016-12-25 (×4): qty 1

## 2016-12-25 MED ORDER — FUROSEMIDE 40 MG PO TABS
60.0000 mg | ORAL_TABLET | Freq: Every day | ORAL | Status: DC
  Administered 2016-12-25: 60 mg via ORAL
  Administered 2016-12-26: 40 mg via ORAL
  Administered 2016-12-27 – 2016-12-28 (×2): 60 mg via ORAL
  Filled 2016-12-25 (×4): qty 1

## 2016-12-25 MED ORDER — ALPRAZOLAM 0.5 MG PO TABS
0.5000 mg | ORAL_TABLET | Freq: Two times a day (BID) | ORAL | Status: DC | PRN
Start: 2016-12-25 — End: 2016-12-28
  Administered 2016-12-28: 0.5 mg via ORAL
  Filled 2016-12-25 (×2): qty 1

## 2016-12-25 MED ORDER — INSULIN ASPART 100 UNIT/ML ~~LOC~~ SOLN
0.0000 [IU] | Freq: Three times a day (TID) | SUBCUTANEOUS | Status: DC
  Administered 2016-12-25 (×3): 2 [IU] via SUBCUTANEOUS
  Administered 2016-12-26 – 2016-12-27 (×5): 1 [IU] via SUBCUTANEOUS

## 2016-12-25 MED ORDER — TRAMADOL HCL 50 MG PO TABS: 50.0000 mg | ORAL_TABLET | Freq: Four times a day (QID) | ORAL | Status: DC | PRN

## 2016-12-25 MED ORDER — INSULIN ASPART 100 UNIT/ML ~~LOC~~ SOLN: 0.0000 [IU] | Freq: Every day | SUBCUTANEOUS | Status: DC

## 2016-12-25 MED ORDER — FAMOTIDINE 20 MG PO TABS
20.0000 mg | ORAL_TABLET | Freq: Every day | ORAL | Status: DC
  Administered 2016-12-25 – 2016-12-27 (×4): 20 mg via ORAL
  Filled 2016-12-25 (×3): qty 1

## 2016-12-25 NOTE — NC FL2 (Signed)
[place] LEVEL OF CARE SCREENING TOOL     IDENTIFICATION  Patient Name: Toni [name] Birthdate: 10/05/1945 Sex: female Admission Date (Current Location): 12/24/2016  [facility] and [place] Number:  Herbalist and Address:  [facility]. [facility], [place] [address], [place], [place] [address]      Provider Number: [id number]  Attending Physician Name and Address:  Geradine Girt, DO  Relative Name and Phone Number:       Current Level of Care: Hospital Recommended Level of Care: [place] Prior Approval Number:    Date Approved/Denied:   PASRR Number: [id number]  Discharge Plan: SNF    Current Diagnoses: Patient Active Problem List   Diagnosis Date Noted  . Multiple sclerosis ([place]) 12/25/2016  . Weakness 12/24/2016  . Dyspnea   . Fever   . Atrial fibrillation with rapid ventricular response ([place]) 08/06/2016  . Right hand weakness 08/06/2016  . Pressure injury of skin 08/06/2016  . Nonischemic cardiomyopathy ([place]) 05/09/2016  . Localized edema 05/09/2016  . Chest pain at rest 02/03/2016  . Long term current use of anticoagulant therapy 02/03/2016  . Chest pain 01/19/2016  . Chronic atrial fibrillation ([place]) 01/19/2016  . Combined systolic and diastolic congestive heart failure ([place]) 01/19/2016  . Atrial fibrillation with RVR ([place]) 01/19/2016  . Persistent atrial fibrillation with RVR ([place]) 01/19/2016  . Dysenteric diarrhea 10/04/2015  . Medication course changed 06/02/2015  . 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 Obesity hypoventilation syndrome ([place]) 03/30/2015  . Abnormality of gait 03/01/2015  . Chronic constipation with overflow incontinence 01/25/2015  . 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 Medication monitoring encounter 01/25/2015  . 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 Neurogenic urinary bladder disorder  08/24/2014  . Acute cystitis with hematuria 08/24/2014  . MS (multiple sclerosis) ([place]) 07/29/2014  . Closed right ankle fracture 10/30/2013  . Leucocytosis 10/30/2013  . Ankle fracture 10/30/2013  . Varicose veins of both lower extremities with complications 74/07/8785  . Paroxysmal A-fib ([place]) 06/04/2013  . Morbid obesity ([place]) 06/04/2013  . OSA on CPAP 06/04/2013  . Muscle spasms of lower extremity 02/24/2013  . Long term (current) use of anticoagulants 11/05/2012  . 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 Aspiration pneumonia vs Failed outpatient Rx of CAP 11/26/2011  . Hypertension   . Atrial fibrillation ([place])   . Thyroid disease   . HLD (hyperlipidemia)     Orientation RESPIRATION BLADDER Height & Weight     Self, Time, Situation, Place  Normal Continent Weight: 273 lb 9.6 oz (124.1 kg) Height:  5\' 3"  (160 cm)  BEHAVIORAL SYMPTOMS/MOOD NEUROLOGICAL BOWEL NUTRITION STATUS      Continent    AMBULATORY STATUS COMMUNICATION OF NEEDS Skin   Extensive Assist Verbally Normal                       Personal Care Assistance Level of Assistance  Bathing, Dressing Bathing Assistance: Maximum assistance   Dressing Assistance: Maximum assistance     Functional Limitations Info             SPECIAL CARE FACTORS FREQUENCY  PT (By licensed PT), OT (By licensed OT)     PT Frequency: 5x/wk OT Frequency: 5x/wk            Contractures      Additional Factors Info  Code  Status, Allergies Code Status Info: full Allergies Info: Hydrocodone, Oxycodone, Chlorhexidine Gluconate, Zoloft Sertraline           Current Medications (12/25/2016):  This is the current hospital active medication list Current Facility-Administered Medications  Medication Dose Route Frequency Provider Last Rate Last Dose  . 0.9 %  sodium chloride infusion  250 mL Intravenous PRN Phillips Grout, MD      . acetaminophen (TYLENOL) tablet 1,000 mg  1,000  mg Oral Q6H PRN Reubin Milan, MD   1,000 mg at 12/25/16 1129  . ALPRAZolam Duanne Moron) tablet 0.5 mg  0.5 mg Oral BID PRN Eulogio Bear U, DO      . baclofen (LIORESAL) tablet 20 mg  20 mg Oral BID PRN Phillips Grout, MD      . buPROPion (WELLBUTRIN XL) 24 hr tablet 300 mg  300 mg Oral Daily Derrill Kay A, MD   300 mg at 12/25/16 1009  . diltiazem (CARDIZEM CD) 24 hr capsule 120 mg  120 mg Oral Daily Derrill Kay A, MD   120 mg at 12/25/16 1009  . docusate sodium (COLACE) capsule 100 mg  100 mg Oral Daily Eulogio Bear U, DO   100 mg at 12/25/16 1010  . famotidine (PEPCID) tablet 20 mg  20 mg Oral QHS Derrill Kay A, MD   20 mg at 12/25/16 0039  . furosemide (LASIX) tablet 60 mg  60 mg Oral Daily Eulogio Bear U, DO   60 mg at 12/25/16 1129  . gabapentin (NEURONTIN) capsule 300 mg  300 mg Oral TID Eulogio Bear U, DO   300 mg at 12/25/16 1010  . insulin aspart (novoLOG) injection 0-5 Units  0-5 Units Subcutaneous QHS Vann, Jessica U, DO      . insulin aspart (novoLOG) injection 0-9 Units  0-9 Units Subcutaneous TID WC Vann, Jessica U, DO   2 Units at 12/25/16 1136  . lactulose (CHRONULAC) 10 GM/15ML solution 10 g  10 g Oral BID Vann, Jessica U, DO   10 g at 12/25/16 1010  . levothyroxine (SYNTHROID, LEVOTHROID) tablet 75 mcg  75 mcg Oral Daily Derrill Kay A, MD      . loratadine (CLARITIN) tablet 10 mg  10 mg Oral Daily Vann, Jessica U, DO   10 mg at 12/25/16 1010  . methylPREDNISolone sodium succinate (SOLU-MEDROL) 500 mg in sodium chloride 0.9 % 50 mL IVPB  500 mg Intravenous Q24H Vann, Jessica U, DO   Stopped at 12/25/16 1159  . nebivolol (BYSTOLIC) tablet 20 mg  20 mg Oral Daily Derrill Kay A, MD   20 mg at 12/25/16 1010  . pantoprazole (PROTONIX) EC tablet 40 mg  40 mg Oral Daily Vann, Jessica U, DO   40 mg at 12/25/16 1009  . simvastatin (ZOCOR) tablet 10 mg  10 mg Oral QHS Derrill Kay A, MD   10 mg at 12/25/16 0038  . sodium chloride flush (NS) 0.9 % injection 3 mL  3 mL  Intravenous Q12H Derrill Kay A, MD   3 mL at 12/25/16 1129  . sodium chloride flush (NS) 0.9 % injection 3 mL  3 mL Intravenous PRN Derrill Kay A, MD      . tamsulosin (FLOMAX) capsule 0.4 mg  0.4 mg Oral Daily Derrill Kay A, MD   0.4 mg at 12/25/16 1010  . traMADol (ULTRAM) tablet 50 mg  50 mg Oral Q6H PRN Eulogio Bear U, DO      . warfarin (COUMADIN) tablet 5  mg  5 mg Oral ONCE-1800 Vann, Jessica U, DO      . Warfarin - Pharmacist Dosing Inpatient   Does not apply q1800 Phillips Grout, MD         Discharge Medications: Please see discharge summary for a list of discharge medications.  Relevant Imaging Results:  Relevant Lab Results:   Additional Information SS#: [id number]  Geralynn Ochs, LCSW

## 2016-12-25 NOTE — Evaluation (Signed)
Physical Therapy Evaluation Patient Details Name: Toni [name] MRN: [id number] DOB: November 01, 1945 Today's Date: 12/25/2016   History of Present Illness  Toni [name] is a 71 y.o. female with medical history significant of MO, OSA cpap qhs, MS since 1989, afib mostly wheelchair bound at home comes in with left leg groin pain down the leg with weakness.  She reports this does not feel like a flare of her MS.  Hurts more with movement.  There was concern she had a DVT however u/s is neg for that.      Clinical Impression  Pt admitted with above diagnosis. Pt currently with functional limitations due to the deficits listed below (see PT Problem List).  Pt will benefit from skilled PT to increase their independence and safety with mobility to allow discharge to the venue listed below.  Pt reports pain 9/10 in L upper thigh to ankle.  Pt sat EOB, and reported pain also in back wrapping around both sides while sitting.  "This is worse than yesterday."  Pt was unable to attempt scooting at EOB, standing or transfer due to pain.  At this point recommend SNF due to pt stating her husband cannot A her at current level.  If she doesn't qualify for SNF for any reason, then she would need HHPT and possibly hospital bed.     Follow Up Recommendations SNF;Supervision for mobility/OOB    Equipment Recommendations  None recommended by PT    Recommendations for Other Services       Precautions / Restrictions Precautions Precautions: Fall      Mobility  Bed Mobility Overal bed mobility: Needs Assistance Bed Mobility: Rolling;Sidelying to Sit;Sit to Sidelying Rolling: Min guard Sidelying to sit: Min assist     Sit to sidelying: Mod assist General bed mobility comments: Sidelying > sit MIN A for bed pad to get L hip brought to EOB.  MOD A for L LE with return to bed.  Pt unable to scoot sideways at EOB due to pain.  Transfers                 General transfer comment: unable to attempt due  to pain  Ambulation/Gait                Stairs            Wheelchair Mobility    Modified Rankin (Stroke Patients Only)       Balance Overall balance assessment: Needs assistance Sitting-balance support: Feet supported Sitting balance-Leahy Scale: Fair                                       Pertinent Vitals/Pain Pain Assessment: 0-10 Pain Score: 9  Pain Location: L upper thigh to ankle Pain Descriptors / Indicators: Sharp;Aching Pain Intervention(s): Limited activity within patient's tolerance;Monitored during session;Premedicated before session    [place] expects to be discharged to:: Private residence Living Arrangements: Spouse/significant other Available Help at Discharge: Family;Available 24 hours/day Type of Home: House Home Access: Ramped entrance     Home Layout: Two level;Able to live on main level with bedroom/bathroom Home Equipment: Gilford Rile - 2 wheels;Wheelchair - manual;Grab bars - tub/shower;Shower seat - built in;Hand held shower head;Other (comment);Adaptive equipment (lift chair, sliding board) Additional Comments: HHPT coming 2x/week    Prior Function           Comments: Pt reports she mostly uses  her w/c around her house and performs transfers mod I. She uses rails from bed to A with the SPT     Hand Dominance   Dominant Hand: Right    Extremity/Trunk Assessment        Lower Extremity Assessment Lower Extremity Assessment: LLE deficits/detail LLE Deficits / Details: Pt with pain in L LE with any movement.  She was able to kick leg out while sitting EOB wihtout resistance LLE: Unable to fully assess due to pain       Communication   Communication: No difficulties  Cognition Arousal/Alertness: Awake/alert Behavior During Therapy: WFL for tasks assessed/performed Overall Cognitive Status: Within Functional Limits for tasks assessed                                         General Comments General comments (skin integrity, edema, etc.): Pt reports pain is worse today than yesterday    Exercises     Assessment/Plan    PT Assessment Patient needs continued PT services  PT Problem List Decreased activity tolerance;Decreased balance;Decreased mobility;Pain       PT Treatment Interventions DME instruction;Functional mobility training;Therapeutic activities;Therapeutic exercise;Balance training;Patient/family education    PT Goals (Current goals can be found in the Care Plan section)  Acute Rehab PT Goals Patient Stated Goal: pain to go away PT Goal Formulation: With patient Time For Goal Achievement: 01/08/17 Potential to Achieve Goals: Fair    Frequency Min 3X/week   Barriers to discharge        Co-evaluation               AM-PAC PT "6 Clicks" Daily Activity  Outcome Measure Difficulty turning over in bed (including adjusting bedclothes, sheets and blankets)?: A Lot Difficulty moving from lying on back to sitting on the side of the bed? : Total Difficulty sitting down on and standing up from a chair with arms (e.g., wheelchair, bedside commode, etc,.)?: Total Help needed moving to and from a bed to chair (including a wheelchair)?: Total Help needed walking in hospital room?: Total Help needed climbing 3-5 steps with a railing? : Total 6 Click Score: 7    End of Session   Activity Tolerance: Patient limited by pain Patient left: in bed;with call bell/phone within reach;with bed alarm set Nurse Communication: Mobility status PT Visit Diagnosis: Pain;Other abnormalities of gait and mobility (R26.89) Pain - Right/Left: Left Pain - part of body: Leg    Time: 1146-1209 PT Time Calculation (min) (ACUTE ONLY): 23 min   Charges:   PT Evaluation $PT Eval Moderate Complexity: 1 Procedure PT Treatments $Therapeutic Activity: 8-22 mins   PT G Codes:   PT G-Codes **NOT FOR INPATIENT CLASS** Functional Assessment Tool Used: AM-PAC 6  Clicks Basic Mobility Functional Limitation: Changing and maintaining body position Changing and Maintaining Body Position Current Status (E7517): At least 80 percent but less than 100 percent impaired, limited or restricted Changing and Maintaining Body Position Goal Status (G0174): At least 40 percent but less than 60 percent impaired, limited or restricted    Toni [name], [place] Pager [phone] 12/25/2016   Galen Manila 12/25/2016, 12:23 PM

## 2016-12-25 NOTE — Progress Notes (Signed)
Toni [name] for warfarin  Indication: atrial fibrillation  Allergies  Allergen Reactions  . Hydrocodone Nausea And Vomiting    Hydrocodone causes vomiting  . Oxycodone Nausea And Vomiting    Oral oxycodone causes vomiting  . Chlorhexidine Gluconate Rash    Sores at site  . Zoloft [Sertraline] Hives, Swelling and Rash    Patient Measurements: Height: 5\' 3"  (160 cm) Weight: 273 lb 9.6 oz (124.1 kg) IBW/kg (Calculated) : 52.4   Vital Signs: Temp: 99.1 F (37.3 C) (05/07 1005) Temp Source: Oral (05/07 1005) BP: 138/89 (05/07 1005) Pulse Rate: 100 (05/07 1005)  Labs:  Recent Labs  12/24/16 1442 12/25/16 0542  HGB 14.3 14.0  HCT 43.9 41.3  PLT 168 165  LABPROT 18.9* 20.3*  INR 1.57 1.71  CREATININE 0.83 0.64    Estimated Creatinine Clearance: 85 mL/min (by C-G formula based on SCr of 0.64 mg/dL).   Medical History: Past Medical History:  Diagnosis Date  . Abnormality of gait 03/01/2015  . Anxiety   . Arthritis    "knees" (01/19/2016)  . Atrial fibrillation with RVR ([place]) 01/19/2016  . Childhood asthma   . Chronic pain    "nerve pain from the MS"  . Depression   . DVT (deep venous thrombosis) ([place]) 1983   "RLE; may have just been phlebitis; it was before the age of dopplers"  . 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 GERD (gastroesophageal reflux disease)   . History of stress test 06/21/10   limited exam with some degree of breast attenuatin of the apex however a component of apical ischemia is not excluded, EF 62%; cardiac cath   . HLD (hyperlipidemia)   . Hx of echocardiogram 04/22/10   EF 55-60%, no valve issues  . Hypertension   . Hypothyroidism   . Migraine    "none since I stopped beta blocker in 1990s" (01/19/2016)  . Multiple sclerosis ([place])    has had this may 1989-[facility] reg doc  . OSA on CPAP   . PAF (paroxysmal atrial fibrillation) ([place])    on coumadin;  documented on monitor 06/2010  . Pneumonia 11/2011  . Shortness of breath   . Thyroid disease     Assessment: 71 yo female on warfarin prior to arrival for hx atrial fibrillation.  INR on admission 1.57 with last dose taken on 5/5.  Home dose of warfarin recently decreased to 5 mg on Sunday, Tuesday and Thursday and 2.5 mg AOD's. Pt is noted to have held 5/3 dose of warfarin due INR of 3.6 in outpatient setting. CBC stable.   INR today is 1.71 (trending up)  Goal of Therapy:  INR 2-3 Monitor platelets by anticoagulation protocol: Yes   Plan:  1. Warfarin 5 mg x 1 this evening 2. Daily INR   Thank you, Dahlia Bailiff, PharmD candidate Anette Guarneri, PharmD  12/25/2016,10:16 AM

## 2016-12-25 NOTE — Care Management Note (Signed)
Case Management Note  Patient Details  Name: Toni [name] MRN: [id number] Date of Birth: 1946/06/26  Subjective/Objective:  Pt admitted with weakness. She is from home with spouse.                  Action/Plan: PT recommending SNF. Awaiting OT. CM following for discharge disposition.   Expected Discharge Date:                  Expected Discharge Plan:  Skilled Nursing Facility  In-House Referral:  Clinical Social Work  Discharge planning Services     Post Acute Care Choice:    Choice offered to:     DME Arranged:    DME Agency:     HH Arranged:    [place] Agency:     Status of Service:  In process, will continue to follow  If discussed at Long Length of Stay Meetings, dates discussed:    Additional Comments:  Pollie Friar, RN 12/25/2016, 4:24 PM

## 2016-12-25 NOTE — Progress Notes (Signed)
PROGRESS NOTE    Toni [name]  QTM:[id number] DOB: 1946/02/13 DOA: 12/24/2016 PCP: Prince Solian, MD   Outpatient Specialists:  Marval Regal   Brief Narrative:  Toni [name] is a 71 y.o. female with medical history significant of MO, OSA cpap qhs, MS since 1989, afib mostly wheelchair bound at home comes in with left leg groin pain down the leg with weakness.  She reports this does not feel like a flare of her MS.  Hurts more with movement.  There was concern she had a DVT however u/s is neg for that.  She was in the process of being discharged but she did not want to go home.  She denies any fevers.  No n/v/d.  No urinary symptoms.  No sob or chest pain.  Neurology was called who suggested MRI to assess if this was MS flare, but patient cannot do MRI due to claustrophobia.  Neuro then recommended iv steroids and observation stay.  Pt is therefore referred for admission for possible MS flare.  No trauma or falls.   Assessment & Plan:   Principal Problem:   Weakness Active Problems:   Multiple sclerosis, 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   HLD (hyperlipidemia)   Long term (current) use of anticoagulants   Morbid obesity (HCC)   OSA on CPAP   Abnormality of gait   Obesity hypoventilation syndrome (HCC)   Combined systolic and diastolic congestive heart failure (HCC)    Weakness and pain to left leg -  -per patient,  atypical for her multiple sclerosis flare -neuro has recommended iv steroid- received 1000mg  solumedrol in ED and plan to continue 500 IV daily x 2 days. -Ultrasound neg for DVT.   -xrays of pelvis and left hip- degeneration -hold on MRI- discussed with neuro    Multiple sclerosis, 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- per  Neurology team    HLD (hyperlipidemia)- cont statin    Long term (current) use of anticoagulants- pharm to manage coumadin, consult requested    Morbid obesity ([place])- noted    OSA on CPAP- ordered this  qhs    Abnormality of gait- baseline status is mostly wheelchair bound -PT Eval as patient states her left leg is usually her strong leg    Obesity hypoventilation syndrome ([place])- noted    Combined systolic and diastolic congestive heart failure ([place])- this is stable and compensated at this time, no ivf needed at this time   DVT prophylaxis:  SCD's  Code Status: Full Code   Family Communication:   Disposition Plan:     Consultants:  Neuro   Subjective: Has severe pain/weakness down left leg  Objective: Vitals:   12/25/16 0200 12/25/16 0500 12/25/16 0527 12/25/16 1005  BP: (!) 136/45  119/62 138/89  Pulse: (!) 44  72 100  Resp: 18  20 18   Temp: 98.1 F (36.7 C)  98.2 F (36.8 C) 99.1 F (37.3 C)  TempSrc: Oral  Oral Oral  SpO2: 96%  97% 98%  Weight:  124.1 kg (273 lb 9.6 oz)    Height:       No intake or output data in the 24 hours ending 12/25/16 1009 Filed Weights   12/24/16 1434 12/24/16 2030 12/25/16 0500  Weight: 120.2 kg (265 lb) 122.9 kg (271 lb) 124.1 kg (273 lb 9.6 oz)    Examination:  General exam: Appears calm and comfortable, obese Respiratory system: Clear to auscultation. Respiratory effort normal. Cardiovascular system: S1 & S2 heard, RRR. No  JVD, murmurs, rubs, gallops or clicks. No pedal edema. Gastrointestinal system: Abdomen is nondistended, soft and nontender. No organomegaly or masses felt. Normal bowel sounds heard. Central nervous system: Alert and oriented. Weak left leg Skin: No rashes, lesions or ulcers Psychiatry: Judgement and insight appear normal. Mood & affect appropriate.     Data Reviewed: I have personally reviewed following labs and imaging studies  CBC:  Recent Labs Lab 12/24/16 1442 12/25/16 0542  WBC 5.2 4.7  NEUTROABS 2.9  --   HGB 14.3 14.0  HCT 43.9 41.3  MCV 91.8 89.0  PLT 168 962   Basic Metabolic Panel:  Recent Labs Lab 12/24/16 1442 12/25/16 0542  NA 139 136  K 4.0 3.8  CL 102 100*  CO2  29 26  GLUCOSE 90 168*  BUN 18 16  CREATININE 0.83 0.64  CALCIUM 9.4 8.8*   GFR: Estimated Creatinine Clearance: 85 mL/min (by C-G formula based on SCr of 0.64 mg/dL). Liver Function Tests:  Recent Labs Lab 12/24/16 1442  AST 19  ALT 19  ALKPHOS 56  BILITOT 0.6  PROT 6.4*  ALBUMIN 3.6   No results for input(s): LIPASE, AMYLASE in the last 168 hours. No results for input(s): AMMONIA in the last 168 hours. Coagulation Profile:  Recent Labs Lab 12/21/16 12/24/16 1442 12/25/16 0542  INR 3.6* 1.57 1.71   Cardiac Enzymes: No results for input(s): CKTOTAL, CKMB, CKMBINDEX, TROPONINI in the last 168 hours. BNP (last 3 results) No results for input(s): PROBNP in the last 8760 hours. HbA1C: No results for input(s): HGBA1C in the last 72 hours. CBG:  Recent Labs Lab 12/25/16 0805  GLUCAP 153*   Lipid Profile: No results for input(s): CHOL, HDL, LDLCALC, TRIG, CHOLHDL, LDLDIRECT in the last 72 hours. Thyroid Function Tests: No results for input(s): TSH, T4TOTAL, FREET4, T3FREE, THYROIDAB in the last 72 hours. Anemia Panel: No results for input(s): VITAMINB12, FOLATE, FERRITIN, TIBC, IRON, RETICCTPCT in the last 72 hours. Urine analysis:    Component Value Date/Time   COLORURINE YELLOW 08/06/2016 0243   APPEARANCEUR CLEAR 08/06/2016 0243   APPEARANCEUR Clear 12/18/2014 1156   LABSPEC 1.028 08/06/2016 0243   PHURINE 5.0 08/06/2016 0243   GLUCOSEU NEGATIVE 08/06/2016 0243   HGBUR NEGATIVE 08/06/2016 0243   BILIRUBINUR NEGATIVE 08/06/2016 0243   BILIRUBINUR Negative 12/18/2014 1156   KETONESUR NEGATIVE 08/06/2016 0243   PROTEINUR NEGATIVE 08/06/2016 0243   UROBILINOGEN 0.2 08/05/2014 0951   NITRITE NEGATIVE 08/06/2016 0243   LEUKOCYTESUR NEGATIVE 08/06/2016 0243   LEUKOCYTESUR 1+ (A) 12/18/2014 1156     )No results found for this or any previous visit (from the past 240 hour(s)).    Anti-infectives    None       Radiology Studies: Dg Hip Unilat With  Pelvis 2-3 Views Left  Result Date: 12/25/2016 CLINICAL DATA:  LEFT hip and leg pain today, no injury. EXAM: DG HIP (WITH OR WITHOUT PELVIS) 2-3V LEFT COMPARISON:  None. FINDINGS: Femoral heads are well formed and located. Mild bilateral hip superolateral acetabular spurring. No destructive bony lesions. Soft tissue planes are nonsuspicious. IMPRESSION: Mild degenerative change of the hips without acute osseous process. Electronically Signed   By: Elon Alas M.D.   On: 12/25/2016 00:20        Scheduled Meds: . buPROPion  300 mg Oral Daily  . diltiazem  120 mg Oral Daily  . docusate sodium  100 mg Oral Daily  . famotidine  20 mg Oral QHS  . gabapentin  300 mg  Oral TID  . insulin aspart  0-5 Units Subcutaneous QHS  . insulin aspart  0-9 Units Subcutaneous TID WC  . lactulose  10 g Oral BID  . levothyroxine  75 mcg Oral Daily  . loratadine  10 mg Oral Daily  . nebivolol  20 mg Oral Daily  . pantoprazole  40 mg Oral Daily  . simvastatin  10 mg Oral QHS  . sodium chloride flush  3 mL Intravenous Q12H  . tamsulosin  0.4 mg Oral Daily  . Warfarin - Pharmacist Dosing Inpatient   Does not apply q1800   Continuous Infusions: . sodium chloride       LOS: 0 days    Time spent: 25 min    [place], DO [facility] Pager [phone]  If 7PM-7AM, please contact night-coverage [email or web address] Password [email or web address] 12/25/2016, 10:09 AM

## 2016-12-25 NOTE — Progress Notes (Signed)
[facility]  Patient Status: Active (receiving services up to time of hospitalization)  [facility] is providing the following services: RN  If patient discharges after hours, please call [phone].   Toni [name] 12/25/2016, 12:08 PM

## 2016-12-26 ENCOUNTER — Ambulatory Visit (INDEPENDENT_AMBULATORY_CARE_PROVIDER_SITE_OTHER): Payer: Medicare Other | Admitting: Orthopaedic Surgery

## 2016-12-26 ENCOUNTER — Inpatient Hospital Stay (HOSPITAL_COMMUNITY): Payer: Medicare Other

## 2016-12-26 DIAGNOSIS — R29898 Other symptoms and signs involving the musculoskeletal system: Secondary | ICD-10-CM

## 2016-12-26 DIAGNOSIS — K5900 Constipation, unspecified: Secondary | ICD-10-CM

## 2016-12-26 LAB — GLUCOSE, CAPILLARY
GLUCOSE-CAPILLARY: 137 mg/dL — AB (ref 65–99)
GLUCOSE-CAPILLARY: 146 mg/dL — AB (ref 65–99)
Glucose-Capillary: 145 mg/dL — ABNORMAL HIGH (ref 65–99)
Glucose-Capillary: 133 mg/dL — ABNORMAL HIGH (ref 65–99)

## 2016-12-26 LAB — PROTIME-INR
INR: 2.89
Prothrombin Time: 30.8 seconds — ABNORMAL HIGH (ref 11.4–15.2)

## 2016-12-26 MED ORDER — LACTULOSE 10 GM/15ML PO SOLN
20.0000 g | Freq: Two times a day (BID) | ORAL | Status: DC
  Administered 2016-12-26 – 2016-12-28 (×5): 20 g via ORAL
  Filled 2016-12-26 (×5): qty 30

## 2016-12-26 MED ORDER — DOCUSATE SODIUM 100 MG PO CAPS
100.0000 mg | ORAL_CAPSULE | Freq: Two times a day (BID) | ORAL | Status: DC
  Administered 2016-12-26 – 2016-12-28 (×5): 100 mg via ORAL
  Filled 2016-12-26 (×5): qty 1

## 2016-12-26 MED ORDER — ADULT MULTIVITAMIN W/MINERALS CH
1.0000 | ORAL_TABLET | Freq: Every day | ORAL | Status: DC
  Administered 2016-12-26 – 2016-12-28 (×3): 1 via ORAL
  Filled 2016-12-26 (×4): qty 1

## 2016-12-26 MED ORDER — VITAMIN D3 25 MCG (1000 UNIT) PO TABS
2000.0000 [IU] | ORAL_TABLET | Freq: Two times a day (BID) | ORAL | Status: DC
  Administered 2016-12-26 – 2016-12-28 (×5): 2000 [IU] via ORAL
  Filled 2016-12-26 (×11): qty 2

## 2016-12-26 MED ORDER — PHENOL 1.4 % MT LIQD: 1.0000 | OROMUCOSAL | Status: DC | PRN

## 2016-12-26 MED ORDER — COENZYME Q10 30 MG PO CAPS: 30.0000 mg | ORAL_CAPSULE | Freq: Every day | ORAL | Status: DC

## 2016-12-26 NOTE — Clinical Social Work Note (Signed)
Clinical Social Work Assessment  Patient Details  Name: Toni [name] MRN: [id number] Date of Birth: 05-Dec-1945  Date of referral:  12/26/16               Reason for consult:  Facility Placement, Discharge Planning                Permission sought to share information with:  Facility Art therapist granted to share information::  Yes, Verbal Permission Granted  Name::        Agency::  SNF  Relationship::     Contact Information:     Housing/Transportation Living arrangements for the past 2 months:  [place] of Information:  Patient Patient Interpreter Needed:  None Criminal Activity/Legal Involvement Pertinent to Current Situation/Hospitalization:  No - Comment as needed Significant Relationships:  Spouse Lives with:  Spouse Do you feel safe going back to the place where you live?  Yes Need for family participation in patient care:  No (Coment)  Care giving concerns:  Pt currently resides at home with her spouse. Pt has increased mobility concerns due to not being able to move her leg as much, so she will require additional help and support that her spouse cannot provide for her. Pt will need short term rehab stay to recover strength and mobility before returning home.   Social Worker assessment / plan:  CSW explained recommendation for SNF placement. CSW confirmed with pt that she was agreeable and asked about any past SNF experiences or placements. Pt discussed previous stays in inpatient rehab at [facility] as well as SNF placement in [place]. Pt requested referral for [facility].   Employment status:  Retired Forensic scientist:  Medicare PT Recommendations:  [place] / Referral to community resources:     Patient/Family's Response to care:  Pt was agreeable to placement in SNF.  Patient/Family's Understanding of and Emotional Response to Diagnosis, Current Treatment, and Prognosis:  Pt appeared  frustrated because she was in pain, but appropriate to the situation and understanding of her functional limitations at this time. Pt acknowledged understanding of CSW role in discharge planning process. Pt is hopeful that a short rehab stay will improve her mobility and ability to function independently upon returning home.  Emotional Assessment Appearance:  Appears stated age Attitude/Demeanor/Rapport:  Other Affect (typically observed):  Appropriate Orientation:  Oriented to Self, Oriented to Place, Oriented to  Time, Oriented to Situation Alcohol / Substance use:  Not Applicable Psych involvement (Current and /or in the community):  No (Comment)  Discharge Needs  Concerns to be addressed:  Care Coordination, Discharge Planning Concerns Readmission within the last 30 days:  No Current discharge risk:  Physical Impairment Barriers to Discharge:  Continued Medical Work up   [employer], [place] 12/26/2016, 10:28 AM

## 2016-12-26 NOTE — Progress Notes (Signed)
[name] for warfarin  Indication: atrial fibrillation  Allergies  Allergen Reactions  . Hydrocodone Nausea And Vomiting    Hydrocodone causes vomiting  . Oxycodone Nausea And Vomiting    Oral oxycodone causes vomiting  . Chlorhexidine Gluconate Rash    Sores at site  . Zoloft [Sertraline] Hives, Swelling and Rash    Patient Measurements: Height: 5\' 3"  (160 cm) Weight: 273 lb 9.Toni oz (124.1 kg) IBW/kg (Calculated) : 52.4   Vital Signs: Temp: 98.4 F (36.9 C) (05/08 0950) Temp Source: Oral (05/08 0950) BP: 120/62 (05/08 0950) Pulse Rate: 70 (05/08 0950)  Labs:  Recent Labs  12/24/16 1442 12/25/16 0542 12/26/16 0347  HGB 14.3 14.0  --   HCT 43.9 41.3  --   PLT 168 165  --   LABPROT 18.9* 20.3* 30.8*  INR 1.57 1.71 2.89  CREATININE 0.83 0.64  --     Estimated Creatinine Clearance: 85 mL/min (by C-G formula based on SCr of 0.64 mg/dL).   Medical History: Past Medical History:  Diagnosis Date  . Abnormality of gait 03/01/2015  . Anxiety   . Arthritis    "knees" (01/19/2016)  . Atrial fibrillation with RVR ([place]) 01/19/2016  . Childhood asthma   . Chronic pain    "nerve pain from the MS"  . Depression   . DVT (deep venous thrombosis) ([place]) 1983   "RLE; may have just been phlebitis; it was before the age of dopplers"  . 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 GERD (gastroesophageal reflux disease)   . History of stress test 06/21/10   limited exam with some degree of breast attenuatin of the apex however a component of apical ischemia is not excluded, EF 62%; cardiac cath   . HLD (hyperlipidemia)   . Hx of echocardiogram 04/22/10   EF 55-60%, no valve issues  . Hypertension   . Hypothyroidism   . Migraine    "none since I stopped beta blocker in 1990s" (01/19/2016)  . Multiple sclerosis ([place])    has had this may 1989-[place] reg doc  . OSA on CPAP   . PAF (paroxysmal  atrial fibrillation) ([place])    on coumadin; documented on monitor 06/2010  . Pneumonia 11/2011  . Shortness of breath   . Thyroid disease     Assessment: 71 yo Toni [name] on warfarin prior to arrival for hx atrial fibrillation.  INR on admission 1.57 with last dose taken on 5/5.  Home dose of warfarin recently decreased to 5 mg on Sunday, Tuesday and Thursday and 2.5 mg AOD's. Pt is noted to have held 5/3 dose of warfarin due INR of 3.Toni in outpatient setting. CBC stable.   INR today is 2.89 (trending up)  Goal of Therapy:  INR 2-3 Monitor platelets by anticoagulation protocol: Yes   Plan:  1. Hold warfarin today (INR up too quickly) 2. Daily INR   Thank you, Dahlia Bailiff, PharmD candidate Anette Guarneri, PharmD  12/26/2016,9:52 AM

## 2016-12-26 NOTE — Progress Notes (Signed)
PROGRESS NOTE    Toni [name]  MEQ:[id number] DOB: 1946/04/03 DOA: 12/24/2016 PCP: Prince Solian, MD   Outpatient Specialists:  [facility] Reg[facility]   Brief Narrative:  Toni [name] is a 71 y.o. female with medical history significant of MO, OSA cpap qhs, MS since 1989, afib mostly wheelchair bound at home comes in with left leg groin pain down the leg with weakness.  She reports this does not feel like a flare of her MS.  Hurts more with movement.  There was concern she had a DVT however u/s is neg for that.  She was in the process of being discharged but she did not want to go home.  She denies any fevers.  No n/v/d.  No urinary symptoms.  No sob or chest pain.  Neurology was called who suggested MRI to assess if this was MS flare, but patient cannot do MRI due to claustrophobia.  Neuro then recommended iv steroids and observation stay.  Pt is therefore referred for admission for possible MS flare.  No trauma or falls.   Assessment & Plan:   Principal Problem:   Weakness Active Problems:   Multiple sclerosis, 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   HLD (hyperlipidemia)   Long term (current) use of anticoagulants   Morbid obesity (HCC)   OSA on CPAP   Abnormality of gait   Obesity hypoventilation syndrome (HCC)   Combined systolic and diastolic congestive heart failure (HCC)   Multiple sclerosis (HCC)    Weakness and pain to left leg - suspect MS flare -neuro has recommended iv steroid- received 1000mg  solumedrol in ED and plan to continue 500 IV daily x 2 days. -Ultrasound neg for DVT.   -xrays of pelvis and left hip- degeneration -hold on MRI- discussed with neuro -patient's strength has improved  c/o sore throat/cough -2 view x ray -suspect viral component    Multiple sclerosis, 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- per  Neurology team    HLD (hyperlipidemia)- cont statin    Long term (current) use of anticoagulants- pharm to manage coumadin,  consult requested    Morbid obesity ([place])- noted    OSA on CPAP- ordered this qhs    Abnormality of gait- baseline status is mostly wheelchair bound -PT Eval- SNF    Obesity hypoventilation syndrome ([place])- noted    Combined systolic and diastolic congestive heart failure ([place])- this is stable and compensated at this time, no ivf needed at this time  Constipation -increase lactulose  DVT prophylaxis:  SCD's  Code Status: Full Code   Family Communication:   Disposition Plan:     Consultants:  Neuro   Subjective: c/o sore throat and cough Able to move left leg better  Objective: Vitals:   12/25/16 2250 12/26/16 0050 12/26/16 0547 12/26/16 0950  BP:  122/61 124/77 120/62  Pulse: 80 79 85 70  Resp: 18 18 20 18   Temp:  98.4 F (36.9 C) 98.7 F (37.1 C) 98.4 F (36.9 C)  TempSrc:  Axillary Oral Oral  SpO2: 97% 97% 97% 98%  Weight:      Height:        Intake/Output Summary (Last 24 hours) at 12/26/16 1053 Last data filed at 12/26/16 0500  Gross per 24 hour  Intake               54 ml  Output                0 ml  Net  54 ml   Filed Weights   12/24/16 1434 12/24/16 2030 12/25/16 0500  Weight: 120.2 kg (265 lb) 122.9 kg (271 lb) 124.1 kg (273 lb 9.6 oz)    Examination:  General exam: morbidly obese patient Respiratory system: diminished, no wheezing Cardiovascular system: S1 & S2 heard, RRR. No JVD, murmurs, rubs, gallops or clicks. No pedal edema. Gastrointestinal system: +BS, soft, obese. Central nervous system: Alert and oriented. Strength improved in left leg Skin: No rashes, lesions or ulcers     Data Reviewed: I have personally reviewed following labs and imaging studies  CBC:  Recent Labs Lab 12/24/16 1442 12/25/16 0542  WBC 5.2 4.7  NEUTROABS 2.9  --   HGB 14.3 14.0  HCT 43.9 41.3  MCV 91.8 89.0  PLT 168 850   Basic Metabolic Panel:  Recent Labs Lab 12/24/16 1442 12/25/16 0542  NA 139 136  K 4.0 3.8  CL 102  100*  CO2 29 26  GLUCOSE 90 168*  BUN 18 16  CREATININE 0.83 0.64  CALCIUM 9.4 8.8*   GFR: Estimated Creatinine Clearance: 85 mL/min (by C-G formula based on SCr of 0.64 mg/dL). Liver Function Tests:  Recent Labs Lab 12/24/16 1442  AST 19  ALT 19  ALKPHOS 56  BILITOT 0.6  PROT 6.4*  ALBUMIN 3.6   No results for input(s): LIPASE, AMYLASE in the last 168 hours. No results for input(s): AMMONIA in the last 168 hours. Coagulation Profile:  Recent Labs Lab 12/21/16 12/24/16 1442 12/25/16 0542 12/26/16 0347  INR 3.6* 1.57 1.71 2.89   Cardiac Enzymes: No results for input(s): CKTOTAL, CKMB, CKMBINDEX, TROPONINI in the last 168 hours. BNP (last 3 results) No results for input(s): PROBNP in the last 8760 hours. HbA1C: No results for input(s): HGBA1C in the last 72 hours. CBG:  Recent Labs Lab 12/25/16 0805 12/25/16 1132 12/25/16 1634 12/25/16 2123 12/26/16 0615  GLUCAP 153* 188* 159* 167* 145*   Lipid Profile: No results for input(s): CHOL, HDL, LDLCALC, TRIG, CHOLHDL, LDLDIRECT in the last 72 hours. Thyroid Function Tests: No results for input(s): TSH, T4TOTAL, FREET4, T3FREE, THYROIDAB in the last 72 hours. Anemia Panel: No results for input(s): VITAMINB12, FOLATE, FERRITIN, TIBC, IRON, RETICCTPCT in the last 72 hours. Urine analysis:    Component Value Date/Time   COLORURINE YELLOW 12/25/2016 1541   APPEARANCEUR CLEAR 12/25/2016 1541   APPEARANCEUR Clear 12/18/2014 1156   LABSPEC 1.005 12/25/2016 1541   PHURINE 6.0 12/25/2016 1541   GLUCOSEU NEGATIVE 12/25/2016 1541   HGBUR NEGATIVE 12/25/2016 1541   BILIRUBINUR NEGATIVE 12/25/2016 1541   BILIRUBINUR Negative 12/18/2014 1156   KETONESUR NEGATIVE 12/25/2016 1541   PROTEINUR NEGATIVE 12/25/2016 1541   UROBILINOGEN 0.2 08/05/2014 0951   NITRITE NEGATIVE 12/25/2016 1541   LEUKOCYTESUR NEGATIVE 12/25/2016 1541   LEUKOCYTESUR 1+ (A) 12/18/2014 1156     )No results found for this or any previous visit  (from the past 240 hour(s)).    Anti-infectives    None       Radiology Studies: Dg Abd 1 View  Result Date: 12/25/2016 CLINICAL DATA:  Acute onset of upper abdominal pain and diaphragmatic tightness. Initial encounter. EXAM: ABDOMEN - 1 VIEW COMPARISON:  CT of the abdomen and pelvis performed 05/11/2014 FINDINGS: The visualized bowel gas pattern is unremarkable. Scattered air and stool filled loops of colon are seen; no abnormal dilatation of small bowel loops is seen to suggest small bowel obstruction. No free intra-abdominal air is identified, though evaluation for free air is limited  on a single supine view. The visualized osseous structures are within normal limits; the sacroiliac joints are unremarkable in appearance. A metallic device is noted along the right flank, with associated lead extending centrally. The visualized lung bases are essentially clear. IMPRESSION: Unremarkable bowel gas pattern; no free intra-abdominal air seen. Moderate amount of stool noted in the colon. Electronically Signed   By: Garald Balding M.D.   On: 12/25/2016 20:44   Dg Hip Unilat With Pelvis 2-3 Views Left  Result Date: 12/25/2016 CLINICAL DATA:  LEFT hip and leg pain today, no injury. EXAM: DG HIP (WITH OR WITHOUT PELVIS) 2-3V LEFT COMPARISON:  None. FINDINGS: Femoral heads are well formed and located. Mild bilateral hip superolateral acetabular spurring. No destructive bony lesions. Soft tissue planes are nonsuspicious. IMPRESSION: Mild degenerative change of the hips without acute osseous process. Electronically Signed   By: Elon Alas M.D.   On: 12/25/2016 00:20        Scheduled Meds: . buPROPion  300 mg Oral Daily  . diltiazem  120 mg Oral Daily  . docusate sodium  100 mg Oral BID  . famotidine  20 mg Oral QHS  . furosemide  60 mg Oral Daily  . gabapentin  300 mg Oral TID  . insulin aspart  0-5 Units Subcutaneous QHS  . insulin aspart  0-9 Units Subcutaneous TID WC  . lactulose  20 g  Oral BID  . levothyroxine  75 mcg Oral Daily  . loratadine  10 mg Oral Daily  . nebivolol  20 mg Oral Daily  . pantoprazole  40 mg Oral Daily  . simvastatin  10 mg Oral QHS  . sodium chloride flush  3 mL Intravenous Q12H  . tamsulosin  0.4 mg Oral Daily  . Warfarin - Pharmacist Dosing Inpatient   Does not apply q1800   Continuous Infusions: . sodium chloride    . methylPREDNISolone (SOLU-MEDROL) injection Stopped (12/25/16 1159)     LOS: 1 day    Time spent: 25 min    Burke, DO [facility] Pager [phone]  If 7PM-7AM, please contact night-coverage [email or web address] Password [email or web address] 12/26/2016, 10:53 AM

## 2016-12-26 NOTE — Progress Notes (Signed)
Pt is concerned about her Betaseron SQ injection she takes every other day.

## 2016-12-26 NOTE — Progress Notes (Signed)
Subjective: Patient still feels weak in her left leg however she does state that with Solu-Medrol seems to improve. Her main complaint right now is pain in her outer thigh.  Objective: Current vital signs: BP 120/62 (BP Location: Left Arm)   Pulse 70   Temp 98.4 F (36.9 C) (Oral)   Resp 18   Ht '5\' 3"'  (1.6 m)   Wt 124.1 kg (273 lb 9.6 oz)   SpO2 98%   BMI 48.47 kg/m  Vital signs in last 24 hours: Temp:  [98.3 F (36.8 C)-98.8 F (37.1 C)] 98.4 F (36.9 C) (05/08 0950) Pulse Rate:  [70-118] 70 (05/08 0950) Resp:  [18-20] 18 (05/08 0950) BP: (105-126)/(61-87) 120/62 (05/08 0950) SpO2:  [97 %-98 %] 98 % (05/08 0950)  Intake/Output from previous day: 05/07 0701 - 05/08 0700 In: 54 [IV Piggyback:54] Out: -  Intake/Output this shift: No intake/output data recorded. Nutritional status: Diet Heart Room service appropriate? Yes; Fluid consistency: Thin  ROS:                                                                                                                                       History obtained from the patient  General ROS: negative for - chills, fatigue, fever, night sweats, weight gain or weight loss Psychological ROS: negative for - behavioral disorder, hallucinations, memory difficulties, mood swings or suicidal ideation Ophthalmic ROS: negative for - blurry vision, double vision, eye pain or loss of vision ENT ROS: negative for - epistaxis, nasal discharge, oral lesions, sore throat, tinnitus or vertigo Allergy and Immunology ROS: negative for - hives or itchy/watery eyes Hematological and Lymphatic ROS: negative for - bleeding problems, bruising or swollen lymph nodes Endocrine ROS: negative for - galactorrhea, hair pattern changes, polydipsia/polyuria or temperature intolerance Respiratory ROS: negative for - cough, hemoptysis, shortness of breath or wheezing Cardiovascular ROS: negative for - chest pain, dyspnea on exertion, edema or irregular  heartbeat Gastrointestinal ROS: negative for - abdominal pain, diarrhea, hematemesis, nausea/vomiting or stool incontinence Genito-Urinary ROS: negative for - dysuria, hematuria, incontinence or urinary frequency/urgency Musculoskeletal ROS: negative for - joint swelling or muscular weakness Neurological ROS: as noted in HPI Dermatological ROS: negative for rash and skin lesion changes    Neurologic Exam: General: NAD Mental Status: Alert, oriented, thought content appropriate.  Speech fluent without evidence of aphasia.  Able to follow 3 step commands without difficulty. Cranial Nerves: II:  Visual fields grossly normal, pupils equal, round, reactive to light and accommodation III,IV, VI: ptosis not present, extra-ocular motions intact bilaterally V,VII: smile symmetric, facial light touch sensation normal bilaterally VIII: hearing normal bilaterally IX,X: uvula rises symmetrically XI: bilateral shoulder shrug XII: midline tongue extension without atrophy or fasciculations  Motor: Right : Upper extremity   5/5    Left:     Upper extremity   5/5  RLE: 4/5 hip flexion and knee extension, 4/5 ankle dorsi/plantar flexion  LLE: 3/5 hip flexion and knee extension, 4/5 ankle dorsi/plantar flexion --Of note there is significant give way weakness with left lower and right lower extremities. She complains of significant pain when lifting her left leg. She gives poor effort lifting her left leg.  Sensory: Decreased fine touch RLE. Allodynia sole of right foot. Decreased temp sensation to left lower leg and foot.  Deep Tendon Reflexes: 2+ bilateral brachioradialis, 0 right biceps, trace left biceps, 0 patellae, 1+ achilles bilaterally. Toes downgoing bilaterally   Lab Results: Basic Metabolic Panel:  Recent Labs Lab 12/24/16 1442 12/25/16 0542  NA 139 136  K 4.0 3.8  CL 102 100*  CO2 29 26  GLUCOSE 90 168*  BUN 18 16  CREATININE 0.83 0.64  CALCIUM 9.4 8.8*    Liver Function  Tests:  Recent Labs Lab 12/24/16 1442  AST 19  ALT 19  ALKPHOS 56  BILITOT 0.6  PROT 6.4*  ALBUMIN 3.6   No results for input(s): LIPASE, AMYLASE in the last 168 hours. No results for input(s): AMMONIA in the last 168 hours.  CBC:  Recent Labs Lab 12/24/16 1442 12/25/16 0542  WBC 5.2 4.7  NEUTROABS 2.9  --   HGB 14.3 14.0  HCT 43.9 41.3  MCV 91.8 89.0  PLT 168 165    Cardiac Enzymes: No results for input(s): CKTOTAL, CKMB, CKMBINDEX, TROPONINI in the last 168 hours.  Lipid Panel: No results for input(s): CHOL, TRIG, HDL, CHOLHDL, VLDL, LDLCALC in the last 168 hours.  CBG:  Recent Labs Lab 12/25/16 0805 12/25/16 1132 12/25/16 1634 12/25/16 2123 12/26/16 0615  GLUCAP 153* 188* 159* 167* 145*    Microbiology: Results for orders placed or performed during the hospital encounter of 08/05/16  MRSA PCR Screening     Status: None   Collection Time: 08/06/16  6:40 AM  Result Value Ref Range Status   MRSA by PCR NEGATIVE NEGATIVE Final    Comment:        The GeneXpert MRSA Assay (FDA approved for NASAL specimens only), is one component of a comprehensive MRSA colonization surveillance program. It is not intended to diagnose MRSA infection nor to guide or monitor treatment for MRSA infections.   Culture, blood (Routine X 2) w Reflex to ID Panel     Status: None   Collection Time: 08/07/16 12:16 PM  Result Value Ref Range Status   Specimen Description BLOOD BLOOD LEFT ARM  Final   Special Requests BOTTLES DRAWN AEROBIC AND ANAEROBIC 5CC  Final   Culture NO GROWTH 5 DAYS  Final   Report Status 08/12/2016 FINAL  Final  Culture, blood (Routine X 2) w Reflex to ID Panel     Status: Abnormal   Collection Time: 08/07/16 12:17 PM  Result Value Ref Range Status   Specimen Description BLOOD RIGHT ANTECUBITAL  Final   Special Requests BOTTLES DRAWN AEROBIC AND ANAEROBIC 5CC  Final   Culture  Setup Time   Final    GRAM POSITIVE COCCI IN CLUSTERS IN BOTH AEROBIC  AND ANAEROBIC BOTTLES Organism ID to follow CRITICAL RESULT CALLED TO, READ BACK BY AND VERIFIED WITH: N. Batchelder Pharm.D. 17:15 08/08/16  ([email or web address])    Culture (A)  Final    STAPHYLOCOCCUS SPECIES (COAGULASE NEGATIVE) THE SIGNIFICANCE OF ISOLATING THIS ORGANISM FROM A SINGLE SET OF BLOOD CULTURES WHEN MULTIPLE SETS ARE DRAWN IS UNCERTAIN. PLEASE NOTIFY THE MICROBIOLOGY DEPARTMENT WITHIN ONE WEEK IF SPECIATION AND SENSITIVITIES ARE REQUIRED.    Report Status 08/10/2016 FINAL  Final  Blood Culture ID Panel (Reflexed)     Status: Abnormal   Collection Time: 08/07/16 12:17 PM  Result Value Ref Range Status   Enterococcus species NOT DETECTED NOT DETECTED Final   Listeria monocytogenes NOT DETECTED NOT DETECTED Final   Staphylococcus species DETECTED (A) NOT DETECTED Final    Comment: CRITICAL RESULT CALLED TO, READ BACK BY AND VERIFIED WITH: N. Batchelder Pharm.D. 17:15 08/08/16 ([email or web address])    Staphylococcus aureus NOT DETECTED NOT DETECTED Final   Methicillin resistance NOT DETECTED NOT DETECTED Final   Streptococcus species NOT DETECTED NOT DETECTED Final   Streptococcus agalactiae NOT DETECTED NOT DETECTED Final   Streptococcus pneumoniae NOT DETECTED NOT DETECTED Final   Streptococcus pyogenes NOT DETECTED NOT DETECTED Final   Acinetobacter baumannii NOT DETECTED NOT DETECTED Final   Enterobacteriaceae species NOT DETECTED NOT DETECTED Final   Enterobacter cloacae complex NOT DETECTED NOT DETECTED Final   Escherichia coli NOT DETECTED NOT DETECTED Final   Klebsiella oxytoca NOT DETECTED NOT DETECTED Final   Klebsiella pneumoniae NOT DETECTED NOT DETECTED Final   Proteus species NOT DETECTED NOT DETECTED Final   Serratia marcescens NOT DETECTED NOT DETECTED Final   Haemophilus influenzae NOT DETECTED NOT DETECTED Final   Neisseria meningitidis NOT DETECTED NOT DETECTED Final   Pseudomonas aeruginosa NOT DETECTED NOT DETECTED Final   Candida albicans NOT DETECTED NOT DETECTED Final    Candida glabrata NOT DETECTED NOT DETECTED Final   Candida krusei NOT DETECTED NOT DETECTED Final   Candida parapsilosis NOT DETECTED NOT DETECTED Final   Candida tropicalis NOT DETECTED NOT DETECTED Final    Coagulation Studies:  Recent Labs  12/24/16 1442 12/25/16 0542 12/26/16 0347  LABPROT 18.9* 20.3* 30.8*  INR 1.57 1.71 2.89    Imaging: Dg Abd 1 View  Result Date: 12/25/2016 CLINICAL DATA:  Acute onset of upper abdominal pain and diaphragmatic tightness. Initial encounter. EXAM: ABDOMEN - 1 VIEW COMPARISON:  CT of the abdomen and pelvis performed 05/11/2014 FINDINGS: The visualized bowel gas pattern is unremarkable. Scattered air and stool filled loops of colon are seen; no abnormal dilatation of small bowel loops is seen to suggest small bowel obstruction. No free intra-abdominal air is identified, though evaluation for free air is limited on a single supine view. The visualized osseous structures are within normal limits; the sacroiliac joints are unremarkable in appearance. A metallic device is noted along the right flank, with associated lead extending centrally. The visualized lung bases are essentially clear. IMPRESSION: Unremarkable bowel gas pattern; no free intra-abdominal air seen. Moderate amount of stool noted in the colon. Electronically Signed   By: Garald Balding M.D.   On: 12/25/2016 20:44   Dg Hip Unilat With Pelvis 2-3 Views Left  Result Date: 12/25/2016 CLINICAL DATA:  LEFT hip and leg pain today, no injury. EXAM: DG HIP (WITH OR WITHOUT PELVIS) 2-3V LEFT COMPARISON:  None. FINDINGS: Femoral heads are well formed and located. Mild bilateral hip superolateral acetabular spurring. No destructive bony lesions. Soft tissue planes are nonsuspicious. IMPRESSION: Mild degenerative change of the hips without acute osseous process. Electronically Signed   By: Elon Alas M.D.   On: 12/25/2016 00:20    Medications:  Prior to Admission:  Facility-Administered  Medications Prior to Admission  Medication Dose Route Frequency Provider Last Rate Last Dose  . baclofen (LIORESAL) intrathecal injection 40 mg/66m  40 mg Intrathecal Once WKathrynn Ducking MD       Prescriptions Prior to Admission  Medication Sig Dispense Refill Last Dose  .  acetaminophen (TYLENOL) 500 MG tablet Take 500-1,000 mg by mouth every 6 (six) hours as needed for fever or headache (or pain).   Past Month at Unknown time  . ALPRAZolam (XANAX) 0.5 MG tablet Take 1 tablet (0.5 mg total) by mouth 2 (two) times daily as needed for anxiety. 20 tablet 0 12/24/2016 at Unknown time  . baclofen (LIORESAL) 20 MG tablet Take 1 tablet (20 mg total) by mouth 3 (three) times daily as needed for muscle spasms. (Patient taking differently: Take 20 mg by mouth See admin instructions. Once to twice a day for breakthrough spasms) 180 each 3  at prn  . buPROPion (WELLBUTRIN XL) 300 MG 24 hr tablet Take 300 mg by mouth daily.   12/24/2016 at Unknown time  . CALCIUM CITRATE PO Take 2 tablets by mouth 2 (two) times daily.   12/23/2016 at Unknown time  . cetirizine (ZYRTEC) 10 MG tablet Take 10 mg by mouth daily.   12/23/2016 at Unknown time  . Cholecalciferol (VITAMIN D) 2000 units tablet Take 2,000 Units by mouth 2 (two) times daily.   12/23/2016 at Unknown time  . co-enzyme Q-10 30 MG capsule Take 30 mg by mouth daily.   12/23/2016 at Unknown time  . corticotropin (ATHACAR H.P.) 80 UNIT/ML injectable gel Inject 80 units subcutaneously once daily for 5 consecutive days every 30 days. 5 mL 5 Past Month at Unknown time  . diltiazem (CARDIZEM CD) 120 MG 24 hr capsule Take 1 capsule (120 mg total) by mouth daily. 90 capsule 3 12/24/2016 at Unknown time  . furosemide (LASIX) 40 MG tablet Take 1.5 tablets (60 mg total) by mouth daily. Take 60 mg by mouth daily (Patient taking differently: Take 60 mg by mouth daily. May take an additional 40 mg as needed if edema is still present) 135 tablet 3 12/23/2016 at Unknown time  .  gabapentin (NEURONTIN) 300 MG capsule TAKE 1 CAPSULE BY MOUTH UP  TO 4 TIMES DAILY (Patient taking differently: TAKE 1 CAPSULE BY MOUTH UP  TO 3 times daily) 360 capsule 3 12/24/2016 at Unknown time  . GENERLAC 10 GM/15ML SOLN TAKE 7.5 ML BY MOUTH TWICE DAILY 900 mL 5 12/23/2016 at Unknown time  . Interferon Beta-1b (BETASERON/EXTAVIA) 0.3 MG KIT injection Every other day , subcutaneus. Please titrate pt based on normal titration schedule. 1 each 12 12/23/2016 at Unknown time  . levothyroxine (SYNTHROID, LEVOTHROID) 75 MCG tablet Take 75 mcg by mouth daily.   12/24/2016 at Unknown time  . Multiple Vitamins-Minerals (ONE-A-DAY WOMENS 50+ ADVANTAGE) TABS Take 1 tablet by mouth daily.   12/23/2016 at Unknown time  . Nebivolol HCl (BYSTOLIC) 20 MG TABS Take 20 mg by mouth daily.   12/24/2016 at Unknown time  . Omega-3 Fatty Acids (FISH OIL) 1000 MG CAPS Take 1,000 mg by mouth daily.   12/23/2016 at Unknown time  . omeprazole (PRILOSEC) 20 MG capsule Take 20 mg by mouth daily.    12/24/2016 at Unknown time  . psyllium (METAMUCIL SMOOTH TEXTURE) 28 % packet Take 1 packet by mouth every other day.   Past Week at Unknown time  . ranitidine (ZANTAC) 150 MG tablet Take 150 mg by mouth at bedtime.    12/23/2016 at Unknown time  . simvastatin (ZOCOR) 20 MG tablet Take 10 mg by mouth at bedtime.    12/23/2016 at Unknown time  . Tamsulosin HCl (FLOMAX) 0.4 MG CAPS Take 0.4 mg by mouth daily.   12/23/2016 at Unknown time  . UNABLE TO  FIND Baclofen pump: Continuous   12/24/2016 at Unknown time  . warfarin (COUMADIN) 5 MG tablet Take 1/2 to 1 tablet by mouth daily as directed by coumadin clinic. 90 tablet 0 12/23/2016 at Unknown time  . docusate sodium (COLACE) 100 MG capsule Take 1 capsule (100 mg total) by mouth daily. (Patient not taking: Reported on 12/24/2016) 10 capsule 0 Not Taking at Unknown time  . fluticasone (FLONASE) 50 MCG/ACT nasal spray Place 2 sprays into both nostrils daily as needed for allergies.    Not Taking at Unknown time   . SYRINGE/NEEDLE, DISP, 1 ML (BD LUER-LOK SYRINGE) 25G X 5/8" 1 ML MISC Use as directed to inject  Acthar 10 each prn Taking  . warfarin (COUMADIN) 3 MG tablet Take 1 - 2 tablet by mouth daily or as directed by coumadin clinic. (Patient not taking: Reported on 12/24/2016) 60 tablet 0 Not Taking at Unknown time   Scheduled: . buPROPion  300 mg Oral Daily  . diltiazem  120 mg Oral Daily  . docusate sodium  100 mg Oral BID  . famotidine  20 mg Oral QHS  . furosemide  60 mg Oral Daily  . gabapentin  300 mg Oral TID  . insulin aspart  0-5 Units Subcutaneous QHS  . insulin aspart  0-9 Units Subcutaneous TID WC  . lactulose  20 g Oral BID  . levothyroxine  75 mcg Oral Daily  . loratadine  10 mg Oral Daily  . nebivolol  20 mg Oral Daily  . pantoprazole  40 mg Oral Daily  . simvastatin  10 mg Oral QHS  . sodium chloride flush  3 mL Intravenous Q12H  . tamsulosin  0.4 mg Oral Daily  . Warfarin - Pharmacist Dosing Inpatient   Does not apply q1800    Assessment/Plan:  This is a 71 year old female with left leg and thigh pain along with weakness. MRI was not able to be obtained. She's had 3 doses of Solu-Medrol with minimal improvement. Her main complaint at this time is not the weakness but more the pain. Exam is inconsistent and shows some functional quality such as giveaway weakness and poor effort. At this time would not continue with Solu-Medrol however would continue with physical therapy. Patient may obtain MRI of her lumbar spine as an outpatient as there will need to be a call to her neurosurgeon's office for guidance regarding turning off the baclofen pump prior to MRI.   At this time neurology will sign off.     Etta Quill PA-C [facility] [phone]  12/26/2016, 10:51 AM

## 2016-12-27 ENCOUNTER — Inpatient Hospital Stay (HOSPITAL_COMMUNITY): Payer: Medicare Other

## 2016-12-27 ENCOUNTER — Encounter (HOSPITAL_COMMUNITY): Payer: Self-pay | Admitting: General Practice

## 2016-12-27 DIAGNOSIS — I5043 Acute on chronic combined systolic (congestive) and diastolic (congestive) heart failure: Secondary | ICD-10-CM

## 2016-12-27 LAB — CBC
HEMATOCRIT: 38.5 % (ref 36.0–46.0)
HEMOGLOBIN: 12.7 g/dL (ref 12.0–15.0)
MCH: 29.5 pg (ref 26.0–34.0)
MCHC: 33 g/dL (ref 30.0–36.0)
MCV: 89.3 fL (ref 78.0–100.0)
Platelets: 174 10*3/uL (ref 150–400)
RBC: 4.31 MIL/uL (ref 3.87–5.11)
RDW: 12.6 % (ref 11.5–15.5)
WBC: 8.7 10*3/uL (ref 4.0–10.5)

## 2016-12-27 LAB — BASIC METABOLIC PANEL
Anion gap: 10 (ref 5–15)
BUN: 22 mg/dL — AB (ref 6–20)
CHLORIDE: 99 mmol/L — AB (ref 101–111)
CO2: 30 mmol/L (ref 22–32)
CREATININE: 0.72 mg/dL (ref 0.44–1.00)
Calcium: 8.7 mg/dL — ABNORMAL LOW (ref 8.9–10.3)
GFR calc non Af Amer: >60 mL/min (ref >60)
Glucose, Bld: 155 mg/dL — ABNORMAL HIGH (ref 65–99)
Potassium: 3.6 mmol/L (ref 3.5–5.1)
Sodium: 139 mmol/L (ref 135–145)

## 2016-12-27 LAB — GLUCOSE, CAPILLARY
GLUCOSE-CAPILLARY: 142 mg/dL — AB (ref 65–99)
GLUCOSE-CAPILLARY: 145 mg/dL — AB (ref 65–99)
Glucose-Capillary: 114 mg/dL — ABNORMAL HIGH (ref 65–99)
Glucose-Capillary: 118 mg/dL — ABNORMAL HIGH (ref 65–99)

## 2016-12-27 LAB — PROTIME-INR
INR: 3.39
Prothrombin Time: 35.1 seconds — ABNORMAL HIGH (ref 11.4–15.2)

## 2016-12-27 MED ORDER — INTERFERON BETA-1B 0.3 MG ~~LOC~~ KIT
0.2500 mg | PACK | SUBCUTANEOUS | Status: DC
  Administered 2016-12-27: 0.25 mg via SUBCUTANEOUS

## 2016-12-27 MED ORDER — LORAZEPAM 2 MG/ML IJ SOLN: 1.0000 mg | Freq: Once | INTRAMUSCULAR | Status: DC

## 2016-12-27 MED ORDER — FUROSEMIDE 10 MG/ML IJ SOLN
20.0000 mg | Freq: Once | INTRAMUSCULAR | Status: AC
  Administered 2016-12-27: 20 mg via INTRAVENOUS
  Filled 2016-12-27: qty 4

## 2016-12-27 NOTE — Progress Notes (Signed)
Pt prefers self placement of BIPAP.

## 2016-12-27 NOTE — Progress Notes (Signed)
PROGRESS NOTE    Toni [name]  WUJ:[id number] DOB: [birth year] DOA: 12/24/2016 PCP: Prince Solian, MD   Outpatient Specialists:  [facility]   Brief Narrative:  Toni [name] is a 71 y.o. female with medical history significant of MO, OSA cpap qhs, MS since 1989, afib mostly wheelchair bound at home comes in with left leg groin pain down the leg with weakness.  She reports this does not feel like a flare of her MS.  Hurts more with movement.  There was concern she had a DVT however u/s is neg for that.  She was in the process of being discharged but she did not want to go home.  She denies any fevers.  No n/v/d.  No urinary symptoms.  No sob or chest pain.  Neurology was called who suggested MRI to assess if this was MS flare, but patient cannot do MRI due to claustrophobia.  Neuro then recommended iv steroids and observation stay.  Pt is therefore referred for admission for possible MS flare.  No trauma or falls.   Assessment & Plan:   Principal Problem:   Weakness Active Problems:   Multiple sclerosis, 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   HLD (hyperlipidemia)   Long term (current) use of anticoagulants   Morbid obesity (HCC)   OSA on CPAP   Abnormality of gait   Obesity hypoventilation syndrome (HCC)   Combined systolic and diastolic congestive heart failure (HCC)   Multiple sclerosis (HCC)    Weakness and pain to left leg - suspected MS flare -neuro has recommended iv steroid- received 1000mg  solumedrol in ED and plan to continue 500 IV daily x 2 days- finished with patinet improvement in mobility but not pain. -Ultrasound neg for DVT.   -xrays of pelvis and left hip- degeneration -ordered MRI after disscusion with Dr. Vertell Limber (placed baclofen pump but has not followed)- pump will need to be checked to ensure working properly after MRI  c/o sore throat/cough -2 view x ray- negative for PNA -suspect viral component    Multiple sclerosis, primary chronic  progressive-diag 1989-Ab to IFN- per  Neurology team    HLD (hyperlipidemia)- cont statin    Long term (current) use of anticoagulants- pharm to manage coumadin, consult requested    Morbid obesity ([place])- noted    OSA on CPAP- ordered this qhs    Abnormality of gait- baseline status is mostly wheelchair bound -PT Eval- SNF    Obesity hypoventilation syndrome ([place])- noted    Combined systolic and diastolic congestive heart failure ([place])- mild exacerbation as weight increased give lasix IV x 1  Constipation -increased lactulose  DVT prophylaxis:  SCD's  Code Status: Full Code   Family Communication:   Disposition Plan:     Consultants:  Neuro   Subjective: Still with pain Throat feels better  Objective: Vitals:   12/26/16 2109 12/27/16 0032 12/27/16 0455 12/27/16 0940  BP: 133/62 126/60 132/68 (!) 143/80  Pulse: 84 85 84 80  Resp: 16 16 16 15   Temp: 98.3 F (36.8 C) 97.8 F (36.6 C) 98 F (36.7 C) 97.5 F (36.4 C)  TempSrc: Oral Axillary Axillary Axillary  SpO2: 96% 97% 98% 98%  Weight:      Height:        Intake/Output Summary (Last 24 hours) at 12/27/16 1245 Last data filed at 12/26/16 2139  Gross per 24 hour  Intake                3 ml  Output                0 ml  Net                3 ml   Filed Weights   12/24/16 1434 12/24/16 2030 12/25/16 0500  Weight: 120.2 kg (265 lb) 122.9 kg (271 lb) 124.1 kg (273 lb 9.6 oz)    Examination:  General exam: A+Ox3 Respiratory system: no wheezing, no increase work of breathing, fine crackles Cardiovascular system:  RRR. Min edema LE Gastrointestinal system: +BS, soft, obese. Central nervous system: Alert and oriented. Strength improved in left leg Skin: bruising on right thigh     Data Reviewed: I have personally reviewed following labs and imaging studies  CBC:  Recent Labs Lab 12/24/16 1442 12/25/16 0542 12/27/16 0410  WBC 5.2 4.7 8.7  NEUTROABS 2.9  --   --   HGB 14.3 14.0 12.7  HCT  43.9 41.3 38.5  MCV 91.8 89.0 89.3  PLT 168 165 962   Basic Metabolic Panel:  Recent Labs Lab 12/24/16 1442 12/25/16 0542 12/27/16 0410  NA 139 136 139  K 4.0 3.8 3.6  CL 102 100* 99*  CO2 29 26 30   GLUCOSE 90 168* 155*  BUN 18 16 22*  CREATININE 0.83 0.64 0.72  CALCIUM 9.4 8.8* 8.7*   GFR: Estimated Creatinine Clearance: 85 mL/min (by C-G formula based on SCr of 0.72 mg/dL). Liver Function Tests:  Recent Labs Lab 12/24/16 1442  AST 19  ALT 19  ALKPHOS 56  BILITOT 0.6  PROT 6.4*  ALBUMIN 3.6   No results for input(s): LIPASE, AMYLASE in the last 168 hours. No results for input(s): AMMONIA in the last 168 hours. Coagulation Profile:  Recent Labs Lab 12/21/16 12/24/16 1442 12/25/16 0542 12/26/16 0347 12/27/16 0410  INR 3.6* 1.57 1.71 2.89 3.39   Cardiac Enzymes: No results for input(s): CKTOTAL, CKMB, CKMBINDEX, TROPONINI in the last 168 hours. BNP (last 3 results) No results for input(s): PROBNP in the last 8760 hours. HbA1C: No results for input(s): HGBA1C in the last 72 hours. CBG:  Recent Labs Lab 12/26/16 1135 12/26/16 1602 12/26/16 2118 12/27/16 0626 12/27/16 1103  GLUCAP 137* 133* 146* 142* 145*   Lipid Profile: No results for input(s): CHOL, HDL, LDLCALC, TRIG, CHOLHDL, LDLDIRECT in the last 72 hours. Thyroid Function Tests: No results for input(s): TSH, T4TOTAL, FREET4, T3FREE, THYROIDAB in the last 72 hours. Anemia Panel: No results for input(s): VITAMINB12, FOLATE, FERRITIN, TIBC, IRON, RETICCTPCT in the last 72 hours. Urine analysis:    Component Value Date/Time   COLORURINE YELLOW 12/25/2016 1541   APPEARANCEUR CLEAR 12/25/2016 1541   APPEARANCEUR Clear 12/18/2014 1156   LABSPEC 1.005 12/25/2016 1541   PHURINE 6.0 12/25/2016 1541   GLUCOSEU NEGATIVE 12/25/2016 1541   HGBUR NEGATIVE 12/25/2016 1541   BILIRUBINUR NEGATIVE 12/25/2016 1541   BILIRUBINUR Negative 12/18/2014 1156   KETONESUR NEGATIVE 12/25/2016 1541   PROTEINUR  NEGATIVE 12/25/2016 1541   UROBILINOGEN 0.2 08/05/2014 0951   NITRITE NEGATIVE 12/25/2016 1541   LEUKOCYTESUR NEGATIVE 12/25/2016 1541   LEUKOCYTESUR 1+ (A) 12/18/2014 1156     )No results found for this or any previous visit (from the past 240 hour(s)).    Anti-infectives    None       Radiology Studies: Dg Chest 2 View  Result Date: 12/26/2016 CLINICAL DATA:  Nonproductive cough and shortness of breath since last night. Former smoker. History of atrial fibrillation. EXAM: CHEST  2 VIEW  COMPARISON:  Chest x-ray of August 07, 2016 FINDINGS: The lungs are adequately inflated. There is no focal infiltrate. The cardiac silhouette is enlarged. The pulmonary interstitial markings are mildly prominent. There is no pleural effusion. The trachea is midline. The bony thorax exhibits no acute abnormality. IMPRESSION: Stable cardiomegaly and mild central pulmonary vascular congestion suggests low-grade compensated CHF. No alveolar pneumonia nor pleural effusion. Electronically Signed   By: David  [place] M.D.   On: 12/26/2016 12:52   Dg Abd 1 View  Result Date: 12/25/2016 CLINICAL DATA:  Acute onset of upper abdominal pain and diaphragmatic tightness. Initial encounter. EXAM: ABDOMEN - 1 VIEW COMPARISON:  CT of the abdomen and pelvis performed 05/11/2014 FINDINGS: The visualized bowel gas pattern is unremarkable. Scattered air and stool filled loops of colon are seen; no abnormal dilatation of small bowel loops is seen to suggest small bowel obstruction. No free intra-abdominal air is identified, though evaluation for free air is limited on a single supine view. The visualized osseous structures are within normal limits; the sacroiliac joints are unremarkable in appearance. A metallic device is noted along the right flank, with associated lead extending centrally. The visualized lung bases are essentially clear. IMPRESSION: Unremarkable bowel gas pattern; no free intra-abdominal air seen. Moderate amount  of stool noted in the colon. Electronically Signed   By: Garald Balding M.D.   On: 12/25/2016 20:44        Scheduled Meds: . buPROPion  300 mg Oral Daily  . cholecalciferol  2,000 Units Oral BID  . diltiazem  120 mg Oral Daily  . docusate sodium  100 mg Oral BID  . famotidine  20 mg Oral QHS  . furosemide  60 mg Oral Daily  . gabapentin  300 mg Oral TID  . insulin aspart  0-5 Units Subcutaneous QHS  . insulin aspart  0-9 Units Subcutaneous TID WC  . Interferon Beta-1b  0.25 mg Subcutaneous Q48H  . lactulose  20 g Oral BID  . levothyroxine  75 mcg Oral Daily  . loratadine  10 mg Oral Daily  . LORazepam  1 mg Intravenous Once  . multivitamin with minerals  1 tablet Oral Daily  . nebivolol  20 mg Oral Daily  . pantoprazole  40 mg Oral Daily  . simvastatin  10 mg Oral QHS  . sodium chloride flush  3 mL Intravenous Q12H  . tamsulosin  0.4 mg Oral Daily  . Warfarin - Pharmacist Dosing Inpatient   Does not apply q1800   Continuous Infusions: . sodium chloride       LOS: 2 days    Time spent: 25 min    [place], DO [facility] Pager [phone]  If 7PM-7AM, please contact night-coverage [email or web address] Password [email or web address] 12/27/2016, 12:45 PM

## 2016-12-27 NOTE — Progress Notes (Signed)
Patient has a bruise on right leg on inside below knee that she feels was possible done when she was transported to hospital per patient.

## 2016-12-27 NOTE — Progress Notes (Signed)
[place] for warfarin  Indication: atrial fibrillation  Allergies  Allergen Reactions  . Hydrocodone Nausea And Vomiting    Hydrocodone causes vomiting  . Oxycodone Nausea And Vomiting    Oral oxycodone causes vomiting  . Chlorhexidine Gluconate Rash    Sores at site  . Zoloft [Sertraline] Hives, Swelling and Rash    Patient Measurements: Height: 5\' 3"  (160 cm) Weight: 273 lb 9.6 oz (124.1 kg) IBW/kg (Calculated) : 52.4   Vital Signs: Temp: 98 F (36.7 C) (05/09 0455) Temp Source: Axillary (05/09 0455) BP: 132/68 (05/09 0455) Pulse Rate: 84 (05/09 0455)  Labs:  Recent Labs  12/24/16 1442 12/25/16 0542 12/26/16 0347 12/27/16 0410  HGB 14.3 14.0  --  12.7  HCT 43.9 41.3  --  38.5  PLT 168 165  --  174  LABPROT 18.9* 20.3* 30.8* 35.1*  INR 1.57 1.71 2.89 3.39  CREATININE 0.83 0.64  --  0.72    Estimated Creatinine Clearance: 85 mL/min (by C-G formula based on SCr of 0.72 mg/dL).     Assessment: 71 yo female on warfarin prior to arrival for hx atrial fibrillation.  INR on admission 1.57 with last dose taken on 5/5.  Home dose of warfarin recently decreased to 5 mg on Sunday, Tuesday and Thursday and 2.5 mg AOD's. Pt is noted to have held 5/3 dose of warfarin due INR of 3.6 in outpatient setting. CBC stable.   INR today is 3.39 (trending up)  Goal of Therapy:  INR 2-3 Monitor platelets by anticoagulation protocol: Yes   Plan:  1. Hold warfarin today  2. Daily INR   Thank you, Anette Guarneri, PharmD [phone] 12/27/2016,8:43 AM

## 2016-12-27 NOTE — Progress Notes (Signed)
Assisted Husband give Interferon SQ injection she tolerated well.

## 2016-12-27 NOTE — Progress Notes (Signed)
Physical Therapy Treatment Patient Details Name: Toni [name] MRN: [id number] DOB: 12/06/1945 Today's Date: 12/27/2016    History of Present Illness Toni [name] is a 71 y.o. female with medical history significant of MO, OSA cpap qhs, MS since 1989, afib mostly wheelchair bound at home comes in with left leg groin pain down the leg with weakness.  She reports this does not feel like a flare of her MS.  Hurts more with movement.  There was concern she had a DVT however u/s is neg for that.      PT Comments    Pt presented supine in bed with HOB elevated, awake and willing to participate in therapy session. Pt seen for mobility progression and tolerated sit<>stand and stand-pivot transfers this session with two person assist. Pt would continue to benefit from skilled physical therapy services at this time while admitted and after d/c to address the below listed limitations in order to improve overall safety and independence with functional mobility.    Follow Up Recommendations  SNF;Supervision for mobility/OOB     Equipment Recommendations  None recommended by PT    Recommendations for Other Services       Precautions / Restrictions Precautions Precautions: Fall Restrictions Weight Bearing Restrictions: No    Mobility  Bed Mobility Overal bed mobility: Needs Assistance Bed Mobility: Rolling;Sidelying to Sit Rolling: Min guard Sidelying to sit: Min guard       General bed mobility comments: increased time, use of bed rails, min guard for safety  Transfers Overall transfer level: Needs assistance Equipment used: Rolling walker (2 wheeled) Transfers: Sit to/from [facility] Sit to Stand: Mod assist Stand pivot transfers: Mod assist;+2 physical assistance;+2 safety/equipment       General transfer comment: increased time, vc'ing for bilateral hand placement, mod A to rise and mod A x2 for pivotal movements to chair with assist to move  RW.  Ambulation/Gait                 Stairs            Wheelchair Mobility    Modified Rankin (Stroke Patients Only)       Balance Overall balance assessment: Needs assistance Sitting-balance support: Feet supported Sitting balance-Leahy Scale: Fair     Standing balance support: During functional activity;Bilateral upper extremity supported Standing balance-Leahy Scale: Poor Standing balance comment: pt reliant on bilateral UEs on RW and min A for static, mod A for dynamic                            Cognition Arousal/Alertness: Awake/alert Behavior During Therapy: WFL for tasks assessed/performed Overall Cognitive Status: Within Functional Limits for tasks assessed                                        Exercises      General Comments        Pertinent Vitals/Pain Pain Assessment: 0-10 Pain Score: 3  Pain Location: L upper thigh to ankle Pain Descriptors / Indicators: Sore Pain Intervention(s): Monitored during session;Repositioned    Home Living                      Prior Function            PT Goals (current goals can now be found in the care  plan section) Acute Rehab PT Goals PT Goal Formulation: With patient Time For Goal Achievement: 01/08/17 Potential to Achieve Goals: Fair Progress towards PT goals: Progressing toward goals    Frequency    Min 3X/week      PT Plan Current plan remains appropriate    Co-evaluation              AM-PAC PT "6 Clicks" Daily Activity  Outcome Measure  Difficulty turning over in bed (including adjusting bedclothes, sheets and blankets)?: A Little Difficulty moving from lying on back to sitting on the side of the bed? : A Little Difficulty sitting down on and standing up from a chair with arms (e.g., wheelchair, bedside commode, etc,.)?: Total Help needed moving to and from a bed to chair (including a wheelchair)?: A Lot Help needed walking in hospital  room?: Total Help needed climbing 3-5 steps with a railing? : Total 6 Click Score: 11    End of Session Equipment Utilized During Treatment: Gait belt Activity Tolerance: Patient limited by fatigue Patient left: in chair;with call bell/phone within reach;with chair alarm set Nurse Communication: Mobility status PT Visit Diagnosis: Pain;Other abnormalities of gait and mobility (R26.89) Pain - Right/Left: Left Pain - part of body: Leg     Time: 7564-3329 PT Time Calculation (min) (ACUTE ONLY): 24 min  Charges:  $Therapeutic Activity: 23-37 mins                    G Codes:       [place], PT, [place] 12/27/2016, 11:17 AM

## 2016-12-28 ENCOUNTER — Inpatient Hospital Stay (HOSPITAL_COMMUNITY): Payer: Medicare Other

## 2016-12-28 DIAGNOSIS — I509 Heart failure, unspecified: Secondary | ICD-10-CM | POA: Diagnosis not present

## 2016-12-28 DIAGNOSIS — T7840XD Allergy, unspecified, subsequent encounter: Secondary | ICD-10-CM | POA: Diagnosis not present

## 2016-12-28 DIAGNOSIS — M6250 Muscle wasting and atrophy, not elsewhere classified, unspecified site: Secondary | ICD-10-CM | POA: Diagnosis not present

## 2016-12-28 DIAGNOSIS — I10 Essential (primary) hypertension: Secondary | ICD-10-CM | POA: Diagnosis not present

## 2016-12-28 DIAGNOSIS — G4730 Sleep apnea, unspecified: Secondary | ICD-10-CM | POA: Diagnosis not present

## 2016-12-28 DIAGNOSIS — I4891 Unspecified atrial fibrillation: Secondary | ICD-10-CM | POA: Diagnosis not present

## 2016-12-28 DIAGNOSIS — E785 Hyperlipidemia, unspecified: Secondary | ICD-10-CM | POA: Diagnosis not present

## 2016-12-28 DIAGNOSIS — R531 Weakness: Secondary | ICD-10-CM | POA: Diagnosis not present

## 2016-12-28 DIAGNOSIS — F419 Anxiety disorder, unspecified: Secondary | ICD-10-CM | POA: Diagnosis not present

## 2016-12-28 DIAGNOSIS — K5900 Constipation, unspecified: Secondary | ICD-10-CM | POA: Diagnosis not present

## 2016-12-28 DIAGNOSIS — G9340 Encephalopathy, unspecified: Secondary | ICD-10-CM | POA: Diagnosis not present

## 2016-12-28 DIAGNOSIS — R278 Other lack of coordination: Secondary | ICD-10-CM | POA: Diagnosis not present

## 2016-12-28 DIAGNOSIS — M48061 Spinal stenosis, lumbar region without neurogenic claudication: Secondary | ICD-10-CM | POA: Diagnosis not present

## 2016-12-28 DIAGNOSIS — G8929 Other chronic pain: Secondary | ICD-10-CM | POA: Diagnosis not present

## 2016-12-28 DIAGNOSIS — R29898 Other symptoms and signs involving the musculoskeletal system: Secondary | ICD-10-CM | POA: Diagnosis not present

## 2016-12-28 DIAGNOSIS — E119 Type 2 diabetes mellitus without complications: Secondary | ICD-10-CM | POA: Diagnosis not present

## 2016-12-28 DIAGNOSIS — G35 Multiple sclerosis: Secondary | ICD-10-CM | POA: Diagnosis not present

## 2016-12-28 DIAGNOSIS — E569 Vitamin deficiency, unspecified: Secondary | ICD-10-CM | POA: Diagnosis not present

## 2016-12-28 DIAGNOSIS — R52 Pain, unspecified: Secondary | ICD-10-CM | POA: Diagnosis not present

## 2016-12-28 DIAGNOSIS — E039 Hypothyroidism, unspecified: Secondary | ICD-10-CM | POA: Diagnosis not present

## 2016-12-28 DIAGNOSIS — R609 Edema, unspecified: Secondary | ICD-10-CM | POA: Diagnosis not present

## 2016-12-28 DIAGNOSIS — M48062 Spinal stenosis, lumbar region with neurogenic claudication: Secondary | ICD-10-CM | POA: Diagnosis not present

## 2016-12-28 DIAGNOSIS — M6281 Muscle weakness (generalized): Secondary | ICD-10-CM | POA: Diagnosis not present

## 2016-12-28 LAB — GLUCOSE, CAPILLARY
GLUCOSE-CAPILLARY: 84 mg/dL (ref 65–99)
Glucose-Capillary: 79 mg/dL (ref 65–99)

## 2016-12-28 LAB — URINALYSIS, ROUTINE W REFLEX MICROSCOPIC
BILIRUBIN URINE: NEGATIVE
GLUCOSE, UA: NEGATIVE mg/dL
HGB URINE DIPSTICK: NEGATIVE
Ketones, ur: NEGATIVE mg/dL
Leukocytes, UA: NEGATIVE
Nitrite: NEGATIVE
PH: 6 (ref 5.0–8.0)
Protein, ur: NEGATIVE mg/dL
SPECIFIC GRAVITY, URINE: 1.005 (ref 1.005–1.030)

## 2016-12-28 LAB — PROTIME-INR
INR: 2.4
PROTHROMBIN TIME: 26.6 s — AB (ref 11.4–15.2)

## 2016-12-28 MED ORDER — WARFARIN SODIUM 5 MG PO TABS
2.5000 mg | ORAL_TABLET | Freq: Once | ORAL | Status: AC
  Administered 2016-12-28: 2.5 mg via ORAL
  Filled 2016-12-28: qty 1

## 2016-12-28 NOTE — Progress Notes (Signed)
PT Cancellation Note  Patient Details Name: Toni [name] MRN: [id number] DOB: 1946/08/06   Cancelled Treatment:    Reason Eval/Treat Not Completed: Other (comment). Pt on bed pan and reporting that she is d/c'ing to SNF today.    [place] 12/28/2016, 2:29 PM

## 2016-12-28 NOTE — Discharge Summary (Signed)
Physician Discharge Summary  VONITA [name] HER:[id number] DOB: 11-17-1945 DOA: 12/24/2016  PCP: Prince Solian, MD  Admit date: 12/24/2016 Discharge date: 12/28/2016  Admitted From: Home Disposition:  SNF  Recommendations for Outpatient Follow-up:  1. Follow up with PCP in 1 week 2. Follow up with Dr. Vertell Limber, Neurosurgery in 1 week 3. Continue coumadin for now, follow up closely with INR  Home Health: No  Equipment/Devices: None   Discharge Condition: Stable CODE STATUS: Full  Diet recommendation: Heart healthy   Brief/Interim Summary: From H&P by Dr. Shanon Brow: Toni [name] is a 71 y.o. female with medical history significant of MO, OSA cpap qhs, MS since 1989, afib mostly wheelchair bound at home comes in with left leg groin pain down the leg with weakness.  She reports this does not feel like a flare of her MS.  Hurts more with movement.  There was concern she had a DVT however u/s is neg for that.  She was in the process of being discharged but she did not want to go home.  She denies any fevers.  No n/v/d.  No urinary symptoms.  No sob or chest pain.  Neurology was called who suggested MRI to assess if this was MS flare, but patient cannot do MRI due to claustrophobia.  Neuro then recommended iv steroids and observation stay.  Pt is therefore referred for admission for possible MS flare.  No trauma or falls.  Interim: She was treated with IV steroids for concern of MS flare. 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 This finding was discussed with neurosurgery Dr. Vertell Limber on call who recommended follow up outpatient for injections. As she has been on coumadin, this will have to be held before procedure. She worked with PT who recommended SNF placement and patient was agreeable.   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 Due to degenerative disease. See MRI report below -Received solumedrol -Need to follow up with Dr. Vertell Limber, Neurosurgery as outpatient for injection tx -PT evaluated and recommended skilled nursing facility  Sore throat/cough -X-ray was negative for pneumonia, suspect viral  Multiple sclerosis -Continue home meds,  Interferon B, muscle relaxers  Hyperlipidemia -Continue statin  Atrial fibrillation -Continue Coumadin with regular INR checks -Cardizem, bystolic   OSA on CPAP  Combined systolic and diastolic congestive heart failure  -Stable. Continue home lasix dose  Hypothyroid -Continue synthroid   Discharge Instructions  Discharge Instructions    Diet - low sodium heart healthy    Complete by:  As directed    Increase activity slowly    Complete by:  As directed      Allergies as of 12/28/2016      Reactions   Hydrocodone Nausea And Vomiting   Hydrocodone causes vomiting   Oxycodone Nausea And Vomiting   Oral oxycodone causes vomiting   Chlorhexidine Gluconate Rash   Sores at site   Zoloft [sertraline] Hives, Swelling, Rash  Medication List    TAKE these medications   acetaminophen 500 MG tablet Commonly known as:  TYLENOL Take 500-1,000 mg by mouth every 6 (six) hours as needed for fever or headache (or pain).   ALPRAZolam 0.5 MG tablet Commonly known as:  XANAX Take 1 tablet (0.5 mg total) by mouth 2 (two) times daily as needed for anxiety.   baclofen 20 MG tablet Commonly known as:  LIORESAL Take 1 tablet (20 mg total) by mouth 3 (three) times daily  as needed for muscle spasms. What changed:  when to take this  additional instructions   buPROPion 300 MG 24 hr tablet Commonly known as:  WELLBUTRIN XL Take 300 mg by mouth daily.   BYSTOLIC 20 MG Tabs Generic drug:  Nebivolol HCl Take 20 mg by mouth daily.   CALCIUM CITRATE PO Take 2 tablets by mouth 2 (two) times daily.   cetirizine 10 MG tablet Commonly known as:  ZYRTEC Take 10 mg by mouth daily.   co-enzyme Q-10 30 MG capsule Take 30 mg by mouth daily.   corticotropin 80 UNIT/ML injectable gel Commonly known as:  ATHACAR H.P. Inject 80 units subcutaneously once daily for 5 consecutive days every 30 days.   diltiazem 120 MG 24 hr capsule Commonly known as:  CARDIZEM CD Take 1 capsule (120 mg total) by mouth daily.   docusate sodium 100 MG capsule Commonly known as:  COLACE Take 1 capsule (100 mg total) by mouth daily.   Fish Oil 1000 MG Caps Take 1,000 mg by mouth daily.   fluticasone 50 MCG/ACT nasal spray Commonly known as:  FLONASE Place 2 sprays into both nostrils daily as needed for allergies.   furosemide 40 MG tablet Commonly known as:  LASIX Take 1.5 tablets (60 mg total) by mouth daily. Take 60 mg by mouth daily What changed:  additional instructions   gabapentin 300 MG capsule Commonly known as:  NEURONTIN TAKE 1 CAPSULE BY MOUTH UP  TO 4 TIMES DAILY What changed:  See the new instructions.   GENERLAC 10 GM/15ML Soln Generic drug:  lactulose (encephalopathy) TAKE 7.5 ML BY MOUTH TWICE DAILY   Interferon Beta-1b 0.3 MG Kit injection Commonly known as:  BETASERON/EXTAVIA Every other day , subcutaneus. Please titrate pt based on normal titration schedule.   levothyroxine 75 MCG tablet Commonly known as:  SYNTHROID, LEVOTHROID Take 75 mcg by mouth daily.   omeprazole 20 MG capsule Commonly known as:  PRILOSEC Take 20 mg by mouth daily.   ONE-A-DAY WOMENS 50+ ADVANTAGE Tabs Take 1 tablet by mouth daily.   psyllium 28 %  packet Commonly known as:  METAMUCIL SMOOTH TEXTURE Take 1 packet by mouth every other day.   ranitidine 150 MG tablet Commonly known as:  ZANTAC Take 150 mg by mouth at bedtime.   simvastatin 20 MG tablet Commonly known as:  ZOCOR Take 10 mg by mouth at bedtime.   SYRINGE/NEEDLE (DISP) 1 ML 25G X 5/8" 1 ML Misc Commonly known as:  BD LUER-LOK SYRINGE Use as directed to inject  Acthar   tamsulosin 0.4 MG Caps capsule Commonly known as:  FLOMAX Take 0.4 mg by mouth daily.   UNABLE TO FIND Baclofen pump: Continuous   Vitamin D 2000 units tablet Take 2,000 Units by mouth 2 (two) times daily.   warfarin 3 MG tablet Commonly known as:  COUMADIN Take 1 - 2 tablet by mouth daily or as directed by coumadin clinic.   warfarin 5 MG tablet Commonly known  as:  COUMADIN Take 1/2 to 1 tablet by mouth daily as directed by coumadin clinic.       Contact information for follow-up providers    Avva, Ravisankar, MD. Call in 3 day(s).   Specialty:  Internal Medicine Contact information: [place] [address] [phone]        Erline Levine, MD. Schedule an appointment as soon as possible for a visit in 1 week(s).   Specialty:  Neurosurgery Contact information: [address] [place] [address] [phone]            Contact information for after-discharge care    Destination    [facility] Follow up.   Specialty:  [place] information: [address] [place] [phone]                 Allergies  Allergen Reactions  . Hydrocodone Nausea And Vomiting    Hydrocodone causes vomiting  . Oxycodone Nausea And Vomiting    Oral oxycodone causes vomiting  . Chlorhexidine Gluconate Rash    Sores at site  . Zoloft [Sertraline] Hives, Swelling and Rash    Consultations:  Neurology    Procedures/Studies: Dg Chest 2 View  Result Date: 12/26/2016 CLINICAL DATA:   Nonproductive cough and shortness of breath since last night. Former smoker. History of atrial fibrillation. EXAM: CHEST  2 VIEW COMPARISON:  Chest x-ray of August 07, 2016 FINDINGS: The lungs are adequately inflated. There is no focal infiltrate. The cardiac silhouette is enlarged. The pulmonary interstitial markings are mildly prominent. There is no pleural effusion. The trachea is midline. The bony thorax exhibits no acute abnormality. IMPRESSION: Stable cardiomegaly and mild central pulmonary vascular congestion suggests low-grade compensated CHF. No alveolar pneumonia nor pleural effusion. Electronically Signed   By: David  Martinique M.D.   On: 12/26/2016 12:52   Dg Abd 1 View  Result Date: 12/25/2016 CLINICAL DATA:  Acute onset of upper abdominal pain and diaphragmatic tightness. Initial encounter. EXAM: ABDOMEN - 1 VIEW COMPARISON:  CT of the abdomen and pelvis performed 05/11/2014 FINDINGS: The visualized bowel gas pattern is unremarkable. Scattered air and stool filled loops of colon are seen; no abnormal dilatation of small bowel loops is seen to suggest small bowel obstruction. No free intra-abdominal air is identified, though evaluation for free air is limited on a single supine view. The visualized osseous structures are within normal limits; the sacroiliac joints are unremarkable in appearance. A metallic device is noted along the right flank, with associated lead extending centrally. The visualized lung bases are essentially clear. IMPRESSION: Unremarkable bowel gas pattern; no free intra-abdominal air seen. Moderate amount of stool noted in the colon. Electronically Signed   By: Garald Balding M.D.   On: 12/25/2016 20:44   Mr Lumbar Spine Wo Contrast  Result Date: 12/28/2016 CLINICAL DATA:  Left leg and groin pain. Left leg weakness. Chronic history of multiple sclerosis. EXAM: MRI LUMBAR SPINE WITHOUT CONTRAST TECHNIQUE: Multiplanar, multisequence MR imaging of the lumbar spine was performed.  No intravenous contrast was administered. COMPARISON:  None. FINDINGS: Segmentation:  Standard. Alignment: 0.7 cm facet mediated anterolisthesis L4 on L5 is identified. Vertebrae: Schmorl's nodes are seen inferior endplate of L2 and superior endplate of L4. No fracture or worrisome lesion. Scattered hemangiomas are identified, most prominent in L4. Conus medullaris: Extends to the T12-L1 level and appears normal. The distal cord demonstrates normal signal. Paraspinal and other soft tissues: Negative. Disc levels: T11-12  is imaged in the sagittal plane only and negative. T12-L1: Shallow right paracentral protrusion without central canal or foraminal stenosis. L1-2: Very shallow left paracentral protrusion without central canal or foraminal stenosis. L2-3: Moderate facet degenerative change and ligamentum flavum thickening are seen. Shallow left paracentral protrusion without central canal or foraminal stenosis. L3-4: Ligamentum flavum thickening and advanced bilateral facet degenerative disease are identified. There is shallow disc bulge. Mild to moderate central canal stenosis is present with narrowing in the subarticular recesses, worse on the left. Mild foraminal narrowing is present. L4-5: Advanced bilateral facet degenerative change is present. The disc is uncovered due to anterolisthesis. Moderately severe central canal stenosis is seen with marked narrowing in the subarticular recesses bilaterally. Moderate to moderately severe bilateral foraminal narrowing is also identified, worse on the left. L5-S1: Bilateral facet degenerative change and a shallow disc bulge without central canal or foraminal stenosis. IMPRESSION: Spondylosis worst at L4-5 where advanced facet arthropathy results in 0.7 cm anterolisthesis. There is moderately severe central canal stenosis at this level with marked narrowing in the subarticular recesses which could impact either descending L5 root. Moderate to moderately severe foraminal  narrowing at this level appears worse on the left. Moderate to moderately severe central canal stenosis at L3-4 where there is advanced facet degenerative change, ligamentum flavum thickening and a shallow disc bulge. There is left worse than right subarticular recess narrowing at this level but no nerve root compression is identified. Normal appearing distal cord and conus medullaris without evidence of multiple sclerosis. Electronically Signed   By: Inge Rise M.D.   On: 12/28/2016 10:31   Dg Hip Unilat With Pelvis 2-3 Views Left  Result Date: 12/25/2016 CLINICAL DATA:  LEFT hip and leg pain today, no injury. EXAM: DG HIP (WITH OR WITHOUT PELVIS) 2-3V LEFT COMPARISON:  None. FINDINGS: Femoral heads are well formed and located. Mild bilateral hip superolateral acetabular spurring. No destructive bony lesions. Soft tissue planes are nonsuspicious. IMPRESSION: Mild degenerative change of the hips without acute osseous process. Electronically Signed   By: Elon Alas M.D.   On: 12/25/2016 00:20    Left lower extremity duplex 5/6 Left lower extremity venous duplex completed.   Preliminary report:  There is no DVT or SVT noted In the left lower extremity.  Incidentally arterial flow appears adequate with normal waveforms.    Discharge Exam: Vitals:   12/28/16 0114 12/28/16 0605  BP: 135/69 131/73  Pulse: 85 87  Resp: 18 18  Temp: (!) 96.8 F (36 C) 97.9 F (36.6 C)   Vitals:   12/27/16 1727 12/27/16 2100 12/28/16 0114 12/28/16 0605  BP: 111/82 128/71 135/69 131/73  Pulse: 70 95 85 87  Resp: _0 Temp: 97.5 F (36.4 C) 98.7 F (37.1 C) (!) 96.8 F (36 C) 97.9 F (36.6 C)  TempSrc: Oral Oral Oral Axillary  SpO2: 99% 100% 99% 100%  Weight:      Height:        General: Pt is alert, awake, not in acute distress Cardiovascular: Irreg rhythm, S1/S2 +, no rubs, no gallops Respiratory: CTA bilaterally, no wheezing, no rhonchi Abdominal: Soft, NT, ND, bowel sounds  + Extremities: no edema, no cyanosis, weaker on left lower extremity 4/5 compared to right 5/5    The results of significant diagnostics from this hospitalization (including imaging, microbiology, ancillary and laboratory) are listed below for reference.     Microbiology: No results found for this or any previous visit (from the past 240  hour(s)).   Labs: BNP (last 3 results)  Recent Labs  01/19/16 1053 08/06/16 0125  BNP 119.3* 456.2*   Basic Metabolic Panel:  Recent Labs Lab 12/24/16 1442 12/25/16 0542 12/27/16 0410  NA 139 136 139  K 4.0 3.8 3.6  CL 102 100* 99*  CO2 _0 GLUCOSE 90 168* 155*  BUN 18 16 22*  CREATININE 0.83 0.64 0.72  CALCIUM 9.4 8.8* 8.7*   Liver Function Tests:  Recent Labs Lab 12/24/16 1442  AST 19  ALT 19  ALKPHOS 56  BILITOT 0.6  PROT 6.4*  ALBUMIN 3.6   No results for input(s): LIPASE, AMYLASE in the last 168 hours. No results for input(s): AMMONIA in the last 168 hours. CBC:  Recent Labs Lab 12/24/16 1442 12/25/16 0542 12/27/16 0410  WBC 5.2 4.7 8.7  NEUTROABS 2.9  --   --   HGB 14.3 14.0 12.7  HCT 43.9 41.3 38.5  MCV 91.8 89.0 89.3  PLT 168 165 174   Cardiac Enzymes: No results for input(s): CKTOTAL, CKMB, CKMBINDEX, TROPONINI in the last 168 hours. BNP: Invalid input(s): POCBNP CBG:  Recent Labs Lab 12/27/16 1103 12/27/16 1646 12/27/16 2059 12/28/16 0602 12/28/16 1120  GLUCAP 145* 114* 118* 84 79   D-Dimer No results for input(s): DDIMER in the last 72 hours. Hgb A1c No results for input(s): HGBA1C in the last 72 hours. Lipid Profile No results for input(s): CHOL, HDL, LDLCALC, TRIG, CHOLHDL, LDLDIRECT in the last 72 hours. Thyroid function studies No results for input(s): TSH, T4TOTAL, T3FREE, THYROIDAB in the last 72 hours.  Invalid input(s): FREET3 Anemia work up No results for input(s): VITAMINB12, FOLATE, FERRITIN, TIBC, IRON, RETICCTPCT in the last 72 hours. Urinalysis    Component  Value Date/Time   COLORURINE YELLOW 12/25/2016 1541   APPEARANCEUR CLEAR 12/25/2016 1541   APPEARANCEUR Clear 12/18/2014 1156   LABSPEC 1.005 12/25/2016 1541   PHURINE 6.0 12/25/2016 1541   GLUCOSEU NEGATIVE 12/25/2016 1541   HGBUR NEGATIVE 12/25/2016 1541   BILIRUBINUR NEGATIVE 12/25/2016 1541   BILIRUBINUR Negative 12/18/2014 1156   KETONESUR NEGATIVE 12/25/2016 1541   PROTEINUR NEGATIVE 12/25/2016 1541   UROBILINOGEN 0.2 08/05/2014 0951   NITRITE NEGATIVE 12/25/2016 1541   LEUKOCYTESUR NEGATIVE 12/25/2016 1541   LEUKOCYTESUR 1+ (A) 12/18/2014 1156   Sepsis Labs Invalid input(s): PROCALCITONIN,  WBC,  LACTICIDVEN Microbiology No results found for this or any previous visit (from the past 240 hour(s)).   Time coordinating discharge: 45 minutes  SIGNED:  Dessa Phi, DO [facility] Pager [phone]  If 7PM-7AM, please contact night-coverage [email or web address] Password [email or web address] 12/28/2016, 1:30 PM

## 2016-12-28 NOTE — Progress Notes (Signed)
Patient went down for MRI, she has a [employer] Baclofen pump inserted into her back by Dr Vertell Limber. This MRI was indicated for lumbar which is very close to the pump. The Radiological Tech asked the patient if she wanted to do this procedure without a [employer] rep present after they told me that there should not be a problem with the MRI and the pump The patient had just gotten xanax and was feeling the drugs effects, and not feeling like she should have to make this decision after being told there would be no problem. She declined the MRI until a rep could be notified, and when she got back to the room she was upset about the way this was handled.

## 2016-12-28 NOTE — Clinical Social Work Placement (Signed)
   CLINICAL SOCIAL WORK PLACEMENT  NOTE 12/28/16 - DISCHARGED TO [facility] AMBULANCE  Date:  12/28/2016  Patient Details  Name: Toni [name] MRN: [id number] Date of Birth: [birth year]  Clinical Social Work is seeking post-discharge placement for this patient at the [place] level of care (*CSW will initial, date and re-position this form in  chart as items are completed):  Yes   Patient/family provided with [facility] Work Department's list of facilities offering this level of care within the geographic area requested by the patient (or if unable, by the patient's family).  Yes   Patient/family informed of their freedom to choose among providers that offer the needed level of care, that participate in Medicare, Medicaid or managed care program needed by the patient, have an available bed and are willing to accept the patient.  Yes   Patient/family informed of [facility]'s ownership interest in [facility] and [facility], as well as of the fact that they are under no obligation to receive care at these facilities.  PASRR submitted to EDS on       PASRR number received on       Existing PASRR number confirmed on       FL2 transmitted to all facilities in geographic area requested by pt/family on       FL2 transmitted to all facilities within larger geographic area on       Patient informed that his/her managed care company has contracts with or will negotiate with certain facilities, including the following:        Yes   Patient/family informed of bed offers received.  Patient chooses bed at [facility]     Physician recommends and patient chooses bed at      Patient to be transferred to  [facility] on  12/28/16.  Patient to be transferred to facility by  Ambulance     Patient family notified on  12/28/16 of transfer.  Name of family member notified:   Xochilth Standish by phone [phone]).      PHYSICIAN       Additional  Comment:    _______________________________________________ Sable Feil, LCSW 12/28/2016, 3:24 PM

## 2016-12-28 NOTE — Progress Notes (Signed)
Pt INR is better 2.4 but PT is 26.6

## 2016-12-28 NOTE — Progress Notes (Signed)
[place] for warfarin  Indication: atrial fibrillation  Allergies  Allergen Reactions  . Hydrocodone Nausea And Vomiting    Hydrocodone causes vomiting  . Oxycodone Nausea And Vomiting    Oral oxycodone causes vomiting  . Chlorhexidine Gluconate Rash    Sores at site  . Zoloft [Sertraline] Hives, Swelling and Rash    Patient Measurements: Height: 5\' 3"  (160 cm) Weight: 273 lb 9.6 oz (124.1 kg) IBW/kg (Calculated) : 52.4   Vital Signs: Temp: 97.9 F (36.6 C) (05/10 0605) Temp Source: Axillary (05/10 0605) BP: 131/73 (05/10 0605) Pulse Rate: 87 (05/10 0605)  Labs:  Recent Labs  12/26/16 0347 12/27/16 0410 12/28/16 0251  HGB  --  12.7  --   HCT  --  38.5  --   PLT  --  174  --   LABPROT 30.8* 35.1* 26.6*  INR 2.89 3.39 2.40  CREATININE  --  0.72  --     Estimated Creatinine Clearance: 85 mL/min (by C-G formula based on SCr of 0.72 mg/dL).   Assessment: 71 yo female on warfarin prior to arrival for hx atrial fibrillation.  INR on admission 1.57 with last dose taken on 5/5.  Home dose of warfarin recently decreased to 5 mg on Sunday, Tuesday and Thursday and 2.5 mg AOD's. Pt is noted to have held 5/3 dose of warfarin due INR of 3.6 in outpatient setting. CBC stable.   INR trended up to peak of 3.39 after 2 doses of 5 mg on admission  INR today is 2.4   Goal of Therapy:  INR 2-3 Monitor platelets by anticoagulation protocol: Yes   Plan:  1. Try warfarin 2.5 mg po x 1 today 2. Daily INR   Thank you, Anette Guarneri, PharmD [phone] 12/28/2016,11:03 AM

## 2016-12-28 NOTE — Progress Notes (Signed)
Pt. Discharging to [facility] SNF. Report called. Await PTAR.

## 2016-12-28 NOTE — Care Management Note (Signed)
Case Management Note  Patient Details  Name: JENELL DOBRANSKY MRN: [id number] Date of Birth: 1946-03-25  Subjective/Objective:                    Action/Plan: Pt discharging to [facility] today. No further needs per CM.   Expected Discharge Date:  12/28/16               Expected Discharge Plan:  Skilled Nursing Facility  In-House Referral:  Clinical Social Work  Discharge planning Services     Post Acute Care Choice:    Choice offered to:     DME Arranged:    DME Agency:     HH Arranged:    [facility] [place] Agency:     Status of Service:  Completed, signed off  If discussed at [employer], dates discussed:    Additional Comments:  Pollie Friar, RN 12/28/2016, 3:29 PM

## 2016-12-29 ENCOUNTER — Telehealth: Payer: Self-pay | Admitting: Neurology

## 2016-12-29 MED ORDER — CORTICOTROPIN 80 UNIT/ML IJ GEL: INTRAMUSCULAR | 5 refills | Status: DC

## 2016-12-29 NOTE — Telephone Encounter (Signed)
I will be happy to write a prescription for ACTHAR , taken every other day for a total of 10 days in the treatment of MS> patient has atrial fibrillation and could not tolerate IV steroid.

## 2016-12-29 NOTE — Telephone Encounter (Signed)
Pt said she at [facility] for a disc issue. She said the directions for corticotropin (ATHACAR H.P.) 80 UNIT/ML injectable gel is 5 days consecutive/mth she said Dr D said she could take if every other for 10 days which she has been doing. She is needing clarification and new orders sent for this medication to [facility].  Pt will contact [place] office to have clinical dept call the clinic or fax for orders. Pt is aware the clinic closes at noon today

## 2016-12-30 DIAGNOSIS — E039 Hypothyroidism, unspecified: Secondary | ICD-10-CM | POA: Diagnosis not present

## 2016-12-30 DIAGNOSIS — F419 Anxiety disorder, unspecified: Secondary | ICD-10-CM | POA: Diagnosis not present

## 2016-12-30 DIAGNOSIS — I10 Essential (primary) hypertension: Secondary | ICD-10-CM | POA: Diagnosis not present

## 2016-12-30 DIAGNOSIS — G35 Multiple sclerosis: Secondary | ICD-10-CM | POA: Diagnosis not present

## 2016-12-30 DIAGNOSIS — M6281 Muscle weakness (generalized): Secondary | ICD-10-CM | POA: Diagnosis not present

## 2017-01-02 NOTE — Telephone Encounter (Signed)
I have not received anything from [facility] regarding this pt. I called pt to make sure everything was straightened out. No answer, left a message asking her to call me back.

## 2017-01-05 NOTE — Telephone Encounter (Signed)
Patient called office returning RN's call I relayed the message per RN [name] that we did not receive any clinic/fax orders from [facility] patient stated that is fine.  Per patient she has coordinated her shots everything is straighten out and will not need to speak with nurse.

## 2017-01-18 DIAGNOSIS — G35 Multiple sclerosis: Secondary | ICD-10-CM | POA: Diagnosis not present

## 2017-01-30 ENCOUNTER — Other Ambulatory Visit: Payer: Self-pay | Admitting: Internal Medicine

## 2017-01-30 DIAGNOSIS — I4891 Unspecified atrial fibrillation: Secondary | ICD-10-CM | POA: Diagnosis not present

## 2017-01-30 DIAGNOSIS — E039 Hypothyroidism, unspecified: Secondary | ICD-10-CM | POA: Diagnosis not present

## 2017-01-30 DIAGNOSIS — M6281 Muscle weakness (generalized): Secondary | ICD-10-CM | POA: Diagnosis not present

## 2017-01-30 DIAGNOSIS — G35 Multiple sclerosis: Secondary | ICD-10-CM | POA: Diagnosis not present

## 2017-01-31 DIAGNOSIS — E119 Type 2 diabetes mellitus without complications: Secondary | ICD-10-CM | POA: Diagnosis not present

## 2017-01-31 DIAGNOSIS — G4733 Obstructive sleep apnea (adult) (pediatric): Secondary | ICD-10-CM | POA: Diagnosis not present

## 2017-01-31 DIAGNOSIS — M48062 Spinal stenosis, lumbar region with neurogenic claudication: Secondary | ICD-10-CM | POA: Diagnosis not present

## 2017-01-31 DIAGNOSIS — F419 Anxiety disorder, unspecified: Secondary | ICD-10-CM | POA: Diagnosis not present

## 2017-01-31 DIAGNOSIS — E785 Hyperlipidemia, unspecified: Secondary | ICD-10-CM | POA: Diagnosis not present

## 2017-01-31 DIAGNOSIS — F329 Major depressive disorder, single episode, unspecified: Secondary | ICD-10-CM | POA: Diagnosis not present

## 2017-01-31 DIAGNOSIS — I509 Heart failure, unspecified: Secondary | ICD-10-CM | POA: Diagnosis not present

## 2017-01-31 DIAGNOSIS — G35 Multiple sclerosis: Secondary | ICD-10-CM | POA: Diagnosis not present

## 2017-01-31 DIAGNOSIS — I480 Paroxysmal atrial fibrillation: Secondary | ICD-10-CM | POA: Diagnosis not present

## 2017-01-31 DIAGNOSIS — I110 Hypertensive heart disease with heart failure: Secondary | ICD-10-CM | POA: Diagnosis not present

## 2017-02-01 ENCOUNTER — Ambulatory Visit (INDEPENDENT_AMBULATORY_CARE_PROVIDER_SITE_OTHER): Payer: Medicare Other | Admitting: Pharmacist

## 2017-02-01 ENCOUNTER — Ambulatory Visit (INDEPENDENT_AMBULATORY_CARE_PROVIDER_SITE_OTHER): Payer: Medicare Other | Admitting: Physician Assistant

## 2017-02-01 DIAGNOSIS — G8929 Other chronic pain: Secondary | ICD-10-CM

## 2017-02-01 DIAGNOSIS — M25511 Pain in right shoulder: Secondary | ICD-10-CM

## 2017-02-01 DIAGNOSIS — G5602 Carpal tunnel syndrome, left upper limb: Secondary | ICD-10-CM | POA: Diagnosis not present

## 2017-02-01 DIAGNOSIS — Z7901 Long term (current) use of anticoagulants: Secondary | ICD-10-CM

## 2017-02-01 DIAGNOSIS — I4891 Unspecified atrial fibrillation: Secondary | ICD-10-CM

## 2017-02-01 LAB — POCT INR: INR: 1.7

## 2017-02-01 MED ORDER — LIDOCAINE HCL 1 % IJ SOLN
3.0000 mL | INTRAMUSCULAR | Status: AC | PRN
  Administered 2017-02-01: 3 mL

## 2017-02-01 MED ORDER — METHYLPREDNISOLONE ACETATE 40 MG/ML IJ SUSP
40.0000 mg | INTRAMUSCULAR | Status: AC | PRN
  Administered 2017-02-01: 40 mg

## 2017-02-01 MED ORDER — LIDOCAINE HCL 1 % IJ SOLN
1.0000 mL | INTRAMUSCULAR | Status: AC | PRN
  Administered 2017-02-01: 1 mL

## 2017-02-01 MED ORDER — METHYLPREDNISOLONE ACETATE 40 MG/ML IJ SUSP
40.0000 mg | INTRAMUSCULAR | Status: AC | PRN
  Administered 2017-02-01: 40 mg via INTRA_ARTICULAR

## 2017-02-01 NOTE — Progress Notes (Signed)
   Procedure Note  Patient: Toni [name]             Date of Birth: May 21, 1946           MRN: [id number]             Visit Date: 02/01/2017 Toni [name] returns today status post injection in her left wrist for carpal tunnel syndrome and right shoulder injection for chronic shoulder pain. She states she did well until recently. She is asking for repeat injections in both these areas today. Procedures: Visit Diagnoses: Carpal tunnel syndrome of left wrist  Chronic right shoulder pain  Large Joint Inj Date/Time: 02/01/2017 3:14 PM Performed by: Pete Pelt Authorized by: Pete Pelt   Consent Given by:  Patient Indications:  Pain Location:  Shoulder Site:  R subacromial bursa Needle Size:  22 G Needle Length:  3.5 inches Approach:  Anterolateral Ultrasound Guidance: No   Fluoroscopic Guidance: No   Arthrogram: No   Medications:  40 mg methylPREDNISolone acetate 40 MG/ML; 3 mL lidocaine 1 % Aspiration Attempted: No   Patient tolerance:  Patient tolerated the procedure well with no immediate complications Hand/UE Inj Date/Time: 02/01/2017 3:15 PM Performed by: Pete Pelt Authorized by: Pete Pelt   Consent Given by:  Patient Indications:  Pain and therapeutic Condition: carpal tunnel   Needle Size:  25 G Approach:  Volar Ultrasound Guidance: No   Medications:  1 mL lidocaine 1 %; 40 mg methylPREDNISolone acetate 40 MG/ML   Plan we'll have her follow with [name] on an as-needed basis. Questions encouraged and answered.

## 2017-02-02 DIAGNOSIS — I480 Paroxysmal atrial fibrillation: Secondary | ICD-10-CM | POA: Diagnosis not present

## 2017-02-02 DIAGNOSIS — I110 Hypertensive heart disease with heart failure: Secondary | ICD-10-CM | POA: Diagnosis not present

## 2017-02-02 DIAGNOSIS — E119 Type 2 diabetes mellitus without complications: Secondary | ICD-10-CM | POA: Diagnosis not present

## 2017-02-02 DIAGNOSIS — G35 Multiple sclerosis: Secondary | ICD-10-CM | POA: Diagnosis not present

## 2017-02-02 DIAGNOSIS — I509 Heart failure, unspecified: Secondary | ICD-10-CM | POA: Diagnosis not present

## 2017-02-02 DIAGNOSIS — M48062 Spinal stenosis, lumbar region with neurogenic claudication: Secondary | ICD-10-CM | POA: Diagnosis not present

## 2017-02-12 DIAGNOSIS — I509 Heart failure, unspecified: Secondary | ICD-10-CM | POA: Diagnosis not present

## 2017-02-12 DIAGNOSIS — I110 Hypertensive heart disease with heart failure: Secondary | ICD-10-CM | POA: Diagnosis not present

## 2017-02-12 DIAGNOSIS — E119 Type 2 diabetes mellitus without complications: Secondary | ICD-10-CM | POA: Diagnosis not present

## 2017-02-12 DIAGNOSIS — M48062 Spinal stenosis, lumbar region with neurogenic claudication: Secondary | ICD-10-CM | POA: Diagnosis not present

## 2017-02-12 DIAGNOSIS — G35 Multiple sclerosis: Secondary | ICD-10-CM | POA: Diagnosis not present

## 2017-02-12 DIAGNOSIS — I480 Paroxysmal atrial fibrillation: Secondary | ICD-10-CM | POA: Diagnosis not present

## 2017-02-14 ENCOUNTER — Ambulatory Visit (INDEPENDENT_AMBULATORY_CARE_PROVIDER_SITE_OTHER): Payer: Medicare Other | Admitting: Internal Medicine

## 2017-02-14 ENCOUNTER — Ambulatory Visit (INDEPENDENT_AMBULATORY_CARE_PROVIDER_SITE_OTHER): Payer: Medicare Other | Admitting: Pharmacist Clinician (PhC)/ Clinical Pharmacy Specialist

## 2017-02-14 ENCOUNTER — Encounter: Payer: Self-pay | Admitting: Internal Medicine

## 2017-02-14 VITALS — BP 139/79 | HR 99 | Ht 63.0 in

## 2017-02-14 DIAGNOSIS — I509 Heart failure, unspecified: Secondary | ICD-10-CM | POA: Diagnosis not present

## 2017-02-14 DIAGNOSIS — Z79899 Other long term (current) drug therapy: Secondary | ICD-10-CM | POA: Diagnosis not present

## 2017-02-14 DIAGNOSIS — I4821 Permanent atrial fibrillation: Secondary | ICD-10-CM

## 2017-02-14 DIAGNOSIS — G35 Multiple sclerosis: Secondary | ICD-10-CM | POA: Diagnosis not present

## 2017-02-14 DIAGNOSIS — I110 Hypertensive heart disease with heart failure: Secondary | ICD-10-CM | POA: Diagnosis not present

## 2017-02-14 DIAGNOSIS — R0602 Shortness of breath: Secondary | ICD-10-CM | POA: Diagnosis not present

## 2017-02-14 DIAGNOSIS — I4891 Unspecified atrial fibrillation: Secondary | ICD-10-CM

## 2017-02-14 DIAGNOSIS — Z7901 Long term (current) use of anticoagulants: Secondary | ICD-10-CM | POA: Diagnosis not present

## 2017-02-14 DIAGNOSIS — I482 Chronic atrial fibrillation: Secondary | ICD-10-CM

## 2017-02-14 DIAGNOSIS — R600 Localized edema: Secondary | ICD-10-CM | POA: Diagnosis not present

## 2017-02-14 DIAGNOSIS — M48062 Spinal stenosis, lumbar region with neurogenic claudication: Secondary | ICD-10-CM | POA: Diagnosis not present

## 2017-02-14 DIAGNOSIS — I872 Venous insufficiency (chronic) (peripheral): Secondary | ICD-10-CM

## 2017-02-14 DIAGNOSIS — I480 Paroxysmal atrial fibrillation: Secondary | ICD-10-CM | POA: Diagnosis not present

## 2017-02-14 DIAGNOSIS — E119 Type 2 diabetes mellitus without complications: Secondary | ICD-10-CM | POA: Diagnosis not present

## 2017-02-14 LAB — BASIC METABOLIC PANEL
BUN/Creatinine Ratio: 31 — ABNORMAL HIGH (ref 12–28)
BUN: 18 mg/dL (ref 8–27)
CALCIUM: 9.2 mg/dL (ref 8.7–10.3)
CHLORIDE: 98 mmol/L (ref 96–106)
CO2: 33 mmol/L — ABNORMAL HIGH (ref 20–29)
CREATININE: 0.58 mg/dL (ref 0.57–1.00)
GFR calc non Af Amer: 94 mL/min/{1.73_m2} (ref >59)
GFR, EST AFRICAN AMERICAN: 109 mL/min/{1.73_m2} (ref >59)
Glucose: 102 mg/dL — ABNORMAL HIGH (ref 65–99)
Potassium: 4.6 mmol/L (ref 3.5–5.2)
Sodium: 142 mmol/L (ref 134–144)

## 2017-02-14 LAB — POCT INR: INR: 2.1

## 2017-02-14 LAB — PRO B NATRIURETIC PEPTIDE: NT-Pro BNP: 788 pg/mL — ABNORMAL HIGH (ref 0–301)

## 2017-02-14 MED ORDER — FUROSEMIDE 40 MG PO TABS: 40.0000 mg | ORAL_TABLET | Freq: Two times a day (BID) | ORAL | 3 refills | Status: DC

## 2017-02-14 NOTE — Patient Instructions (Addendum)
  Your physician recommends that you return for lab work in: [place] has recommended you make the following change in your medication: INCREASE lasix to 40mg  twice daily  Your physician recommends that you schedule a follow-up appointment in: 4-6 weeks

## 2017-02-14 NOTE — Progress Notes (Signed)
OFFICE NOTE  Chief Complaint:  Edema  Primary Care Physician: Prince Solian, MD  HPI:  Toni [name] is a 71 year old female with multiple sclerosis, paroxysmal atrial fibrillation, on Coumadin, morbid obesity, obstructive sleep apnea, and dyslipidemia. She has had worsening swelling in her legs, thought to be due to an MS flare. She has a history of varicose veins with severe venous insufficiency in both the right and left greater saphenous veins. The right greater saphenous vein was actually successfully ablated by my partner, Dr. Elisabeth Cara, about 1 to 2 years ago. She does have residual reflux in the left greater saphenous vein extending from the proximal calf up to the saphenofemoral junction. The vein measured between 4 and 8 mm with between 1 and 6 seconds of reflux. We had discussed ablation of that before, but she has canceled several times at procedures. At this point, I think there is little benefit to continuing to try for her to undergo ablation. 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 She can wear work showed a compression stockings and this does seem to help her. With regards to her atrial fibrillation she has not had significant recurrence to her knowledge. She continues on warfarin and has been therapeutic, with her INR of 1.9 in the office by fingerstick today.  Toni [name] returns today for followup. She reports her MS is somewhat progressive. She continues to have some enlargement of her legs but no significant swelling. There is a mild varicosities in the right lower extremity. She is unaware of her atrial fibrillation. INR today was 2.3.   I saw Toni [name] back in the office today,  She is describing more shortness of breath and feels that she's been in A. Fib more recently. In fact today6/21her EKG does show persistent atrial fibrillation. In the past she's been paroxysmal , but it may be that she is more frequently having A.  Fib. She has trouble telling whether not she is more short of breath or symptomatic from A. Fib versus her multiple sclerosis. She is also reporting more swelling in her left leg.  Toni [name] returns for follow-up today. She recently saw her neurologist regarding her MS which seems to be flaring. In addition she was noted to have significant apneic events despite using CPAP. There is concern for possible central sleep apnea. Both of these episodes are probably contributing to her recurrent atrial fibrillation. She's been wearing a monitor for a period of time which indicates a burden of A. fib of about 80% of the time. It does not appear that she is in persistent A. fib, rather she has had some episodes of sinus rhythm, but they're much less frequent than her A. Fib.  I saw Toni [name] back today in the office. Recently I saw her as an add-on visit briefly as she was describing some chest pain when she was getting her INR checked. An EKG at that time showed no ischemia. I did not feel that this was cardiac. She continues intermittently have abnormal pains both in her chest as well as her abdomen and extremities. This may be related to MS. She's had difficulty with CPAP and apparently getting a good mask fit. She has been a persistent atrial fibrillation for about 6 months. It's unclear whether or not she symptomatic with this and therefore I have not pursued a rhythm control strategy. Her rate control is generally pretty good and she has been therapeutic on warfarin.  She is reporting worsening swelling of her legs although her weight she says is been stable. She has a history of venous insufficiency and had a right greater saphenous vein ablation by one of my partners. She's concerned about more swelling in her left leg and whether she's developed worsening venous insufficiency. She also reports cold feet with numbness in her extremities. As she is basically wheelchair bound, I cannot assess whether she is having  any claudication.  Toni [name] returns today for follow-up. She underwent venous and arterial lower external Dopplers. Arterial Dopplers were normal which showed no evidence of obstruction. Her venous Doppler showed no DVT. There was significant venous reflux in the left greater saphenous vein. This is an area where she has swelling and discomfort. This may be amenable to treatment. I increased her Lasix and she reports some improvement in her leg swelling. She's trying to elevate her feet and reports she is intolerant of wearing compression stockings.  02/03/2016  Toni [name] was seen today in the office for follow-up of recent hospitalization. She was seeing her neurologist and complained of left chest and left arm pain and was referred to the hospital. She ruled out for MI and was felt that her chest pain was on coronary. She underwent an echocardiogram which showed low-normal EF but was also found to be in A. fib with rapid ventricular response. Her A. fib is persistent at this time but the rate was elevated. She was started on Cardizem which helped improve her rate and was discharged on Cardizem CD 120 mg daily. Heart rate today is better controlled in the 70s. She reports her arm pain is improved and she said today that she "did not feel that it was her heart".  05/09/2016  Toni [name] was seen in the office for follow-up today. She reports improved heart rate control with heart rates between 50 and 80 on diltiazem. Over this will help with her mild cardiomyopathy with EF 50-55% on recent echo. She reports some increased fluid gain particularly in her abdomen. This may be due to some degree of right heart dysfunction. She's working with her neurologist for MS as well as her sleep apnea. There is a component of central sleep apnea and she is reportedly going to have additional sleep study to look for the need for BiPAP therapy. She denies any recurrent chest pain or left arm pain.  02/14/2017  Toni [name] was  seen today in follow-up. She was recently hospitalized. She said problems with increasing fluid gain. I increased her Lasix last time however when she was hospitalized she required high-dose Lasix and says she has she felt better with that. She subsequently been put on BiPAP therapy. She says her sleeping is improved significantly. She is concerned though recently she's had some increased weight gain. She feels like she may need an increasing dose of diuretic. Her last echo was in December 2017 which showed an EF of 55%. This is been stable. If this is some heart failure, may be related diastolic dysfunction however suspect is more likely related to autonomic dysfunction the setting of multiple sclerosis.  PMHx:  Past Medical History:  Diagnosis Date  . Abnormality of gait 03/01/2015  . Anxiety   . Arthritis    "knees" (01/19/2016)  . Atrial fibrillation with RVR ([place]) 01/19/2016  . Childhood asthma   . Chronic pain    "nerve pain from the MS"  . Depression   . DVT (deep venous thrombosis) ([place]) 1983   "  RLE; may have just been phlebitis; it was before the age of dopplers"  . 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 GERD (gastroesophageal reflux disease)   . History of stress test 06/21/10   limited exam with some degree of breast attenuatin of the apex however a component of apical ischemia is not excluded, EF 62%; cardiac cath   . HLD (hyperlipidemia)   . Hx of echocardiogram 04/22/10   EF 55-60%, no valve issues  . Hypertension   . Hypothyroidism   . Migraine    "none since I stopped beta blocker in 1990s" (01/19/2016)  . Multiple sclerosis ([place])    has had this may 1989-[place] reg doc  . OSA on CPAP   . PAF (paroxysmal atrial fibrillation) ([place])    on coumadin; documented on monitor 06/2010  . Pneumonia 11/2011  . Shortness of breath   . Thyroid disease     Past Surgical History:  Procedure Laterality Date  . ANTERIOR CERVICAL  DECOMP/DISCECTOMY FUSION  01/2004  . BACK SURGERY    . CARDIAC CATHETERIZATION  06/22/2010   normal coronary arteries, PAF  . CATARACT EXTRACTION, BILATERAL Bilateral   . CHILD BIRTHS     X2  . DILATION AND CURETTAGE OF UTERUS    . INFUSION PUMP IMPLANTATION  ~ 2009   baclofen infusion in lower abd  . PAIN PUMP REVISION N/A 07/29/2014   Procedure: Baclofen pump replacement;  Surgeon: Erline Levine, MD;  Location: [place] [facility];  Service: Neurosurgery;  Laterality: N/A;  Baclofen pump replacement  . TONSILLECTOMY AND ADENOIDECTOMY    . VARICOSE VEIN SURGERY  ~ 1968  . VENOUS ABLATION  12/16/2010   radiofreq ablation -Dr Elisabeth Cara and [facility]  . WISDOM TOOTH EXTRACTION      FAMHx:  Family History  Problem Relation Age of Onset  . Coronary artery disease Father        at age 8  . Coronary artery disease Maternal Grandmother   . Depression Maternal Grandmother   . Cancer Paternal Grandmother   . Depression Mother   . Alcoholism Son     SOCHx:   reports that she quit smoking about 40 years ago. Her smoking use included Cigarettes. She has a 3.50 pack-year smoking history. She has never used smokeless tobacco. She reports that she drinks about 0.6 oz of alcohol per week . She reports that she does not use drugs.  ALLERGIES:  Allergies  Allergen Reactions  . Hydrocodone Nausea And Vomiting    Hydrocodone causes vomiting  . Oxycodone Nausea And Vomiting    Oral oxycodone causes vomiting  . Chlorhexidine Gluconate Rash    Sores at site  . Zoloft [Sertraline] Hives, Swelling and Rash    ROS: Pertinent items noted in HPI and remainder of comprehensive ROS otherwise negative.  HOME MEDS: Current Outpatient Prescriptions  Medication Sig Dispense Refill  . acetaminophen (TYLENOL) 500 MG tablet Take 500-1,000 mg by mouth every 6 (six) hours as needed for fever or headache (or pain).    Marland Kitchen ALPRAZolam (XANAX) 0.5 MG tablet Take 1 tablet (0.5 mg total) by mouth 2 (two) times daily as needed  for anxiety. 20 tablet 0  . baclofen (LIORESAL) 20 MG tablet Take 1 tablet (20 mg total) by mouth 3 (three) times daily as needed for muscle spasms. (Patient taking differently: Take 20 mg by mouth See admin instructions. Once to twice a day for  breakthrough spasms) 180 each 3  . buPROPion (WELLBUTRIN XL) 300 MG 24 hr tablet Take 300 mg by mouth daily.    Marland Kitchen CALCIUM CITRATE PO Take 2 tablets by mouth 2 (two) times daily.    . cetirizine (ZYRTEC) 10 MG tablet Take 10 mg by mouth daily.    . Cholecalciferol (VITAMIN D) 2000 units tablet Take 2,000 Units by mouth 2 (two) times daily.    Marland Kitchen co-enzyme Q-10 30 MG capsule Take 30 mg by mouth daily.    . corticotropin (ATHACAR H.P.) 80 UNIT/ML injectable gel Inject 80 units subcutaneously once every other day  for a total of 10 days , repeat  every 30 days. 5 mL 5  . diltiazem (CARDIZEM CD) 120 MG 24 hr capsule TAKE 1 CAPSULE BY MOUTH  DAILY 90 capsule 0  . docusate sodium (COLACE) 100 MG capsule Take 1 capsule (100 mg total) by mouth daily. 10 capsule 0  . fluticasone (FLONASE) 50 MCG/ACT nasal spray Place 2 sprays into both nostrils daily as needed for allergies.     . furosemide (LASIX) 40 MG tablet Take 1.5 tablets (60 mg total) by mouth daily. Take 60 mg by mouth daily (Patient taking differently: Take 60 mg by mouth daily. May take an additional 40 mg as needed if edema is still present) 135 tablet 3  . gabapentin (NEURONTIN) 300 MG capsule TAKE 1 CAPSULE BY MOUTH UP  TO 4 TIMES DAILY (Patient taking differently: TAKE 1 CAPSULE BY MOUTH UP  TO 3 times daily) 360 capsule 3  . GENERLAC 10 GM/15ML SOLN TAKE 7.5 ML BY MOUTH TWICE DAILY 900 mL 5  . Interferon Beta-1b (BETASERON/EXTAVIA) 0.3 MG KIT injection Every other day , subcutaneus. Please titrate pt based on normal titration schedule. 1 each 12  . levothyroxine (SYNTHROID, LEVOTHROID) 75 MCG tablet Take 75 mcg by mouth daily.    . Multiple Vitamins-Minerals (ONE-A-DAY WOMENS 50+ ADVANTAGE) TABS Take 1  tablet by mouth daily.    . Nebivolol HCl (BYSTOLIC) 20 MG TABS Take 20 mg by mouth daily.    . Omega-3 Fatty Acids (FISH OIL) 1000 MG CAPS Take 1,000 mg by mouth daily.    Marland Kitchen omeprazole (PRILOSEC) 20 MG capsule Take 20 mg by mouth daily.     . psyllium (METAMUCIL SMOOTH TEXTURE) 28 % packet Take 1 packet by mouth every other day.    . ranitidine (ZANTAC) 150 MG tablet Take 150 mg by mouth at bedtime.     . simvastatin (ZOCOR) 20 MG tablet Take 10 mg by mouth at bedtime.     . SYRINGE/NEEDLE, DISP, 1 ML (BD LUER-LOK SYRINGE) 25G X 5/8" 1 ML MISC Use as directed to inject  Acthar 10 each prn  . Tamsulosin HCl (FLOMAX) 0.4 MG CAPS Take 0.4 mg by mouth daily.    Marland Kitchen UNABLE TO FIND Baclofen pump: Continuous    . warfarin (COUMADIN) 3 MG tablet Take 1 - 2 tablet by mouth daily or as directed by coumadin clinic. 60 tablet 0  . warfarin (COUMADIN) 5 MG tablet Take 1/2 to 1 tablet by mouth daily as directed by coumadin clinic. 90 tablet 0   Current Facility-Administered Medications  Medication Dose Route Frequency Provider Last Rate Last Dose  . baclofen (LIORESAL) intrathecal injection 40 mg/37m  40 mg Intrathecal Once WKathrynn Ducking MD        LABS/IMAGING: No results found for this or any previous visit (from the past 48 hour(s)). No results found.  VITALS:  BP 139/79   Pulse 99   Ht '5\' 3"'  (1.6 m)   EXAM: General appearance: alert, no distress, morbidly obese and in wheelchari Neck: no carotid bruit, no JVD and thyroid not enlarged, symmetric, no tenderness/mass/nodules Lungs: clear to auscultation bilaterally Heart: irregularly irregular rhythm Abdomen: soft, non-tender; bowel sounds normal; no masses,  no organomegaly Extremities: edema 1+ LE edema, stockings in place Pulses: 2+ and symmetric Skin: Skin color, texture, turgor normal. No rashes or lesions Neurologic: Grossly normal Psych: Pleasant  EKG: A. fib at 99  ASSESSMENT: 1. Persistent atrial fibrillation - 80% frequency  of A. Fib (not clear if symptomatic) 2. Morbid obesity 3. Obstructive sleep apnea on BIPAP 4. Multiple sclerosis 5. Chronic anticoagulation on warfarin - INR 2.3 today 6. Chronic venous insufficiency - +LGSV reflux 7. Hypertension-controlled 8. Noncardiac chest and left arm pain 9. Non-ischemic cardiomyopathy - EF 50%, improved to 55% (07/2016)  PLAN: 1.   Toni [name] Reports recently she's had some increasing weight gain and swelling. She's concerned about fluid retention. She wants to increase her Lasix and will increase it up to 40 mg twice a day. I like to check a metabolic profile and BNP. Most recently in December her EF was normal 55%. A. fib rate is been well controlled. She's currently on BiPAP therapy. Plan follow-up in 4-6 weeks.  Pixie Casino, MD, [facility] Attending Cardiologist [place] [facility] [place] 02/14/2017, 11:53 AM

## 2017-02-15 ENCOUNTER — Telehealth: Payer: Self-pay | Admitting: Internal Medicine

## 2017-02-15 DIAGNOSIS — G35 Multiple sclerosis: Secondary | ICD-10-CM | POA: Diagnosis not present

## 2017-02-15 DIAGNOSIS — E119 Type 2 diabetes mellitus without complications: Secondary | ICD-10-CM | POA: Diagnosis not present

## 2017-02-15 DIAGNOSIS — I509 Heart failure, unspecified: Secondary | ICD-10-CM | POA: Diagnosis not present

## 2017-02-15 DIAGNOSIS — M48062 Spinal stenosis, lumbar region with neurogenic claudication: Secondary | ICD-10-CM | POA: Diagnosis not present

## 2017-02-15 DIAGNOSIS — I480 Paroxysmal atrial fibrillation: Secondary | ICD-10-CM | POA: Diagnosis not present

## 2017-02-15 DIAGNOSIS — I110 Hypertensive heart disease with heart failure: Secondary | ICD-10-CM | POA: Diagnosis not present

## 2017-02-15 NOTE — Telephone Encounter (Signed)
New message   Pt verbalized that her husband received a phone call about her results and  The pt verbalized that she wishes to speak to the rn about them herself

## 2017-02-15 NOTE — Telephone Encounter (Signed)
Spoke to patient. Patient had 3 question in regards to lab results 1) she notice that shortness of breath was listed on" avs". She wanted to know why?  2) she wanted to know if she could take 80 mg at one time daily instead of 40 mg twice day as prescribed. She states she had been taking 60 mg daily. She rather take medication once than twice a day.  3) will shortness of breath be one of her problem list /diagnosis- " she states she does not have that problem.  RN informed patient will defer to Dr Debara Pickett.

## 2017-02-16 NOTE — Telephone Encounter (Signed)
Shortness of breath was dx used for pro-BNP lab order -- explained this is not on her "problem list"  Patient would like to know if she can take lasix 80mg  all at once. Advised it be best to take as directed but she was taking all 60mg  (previous dose) at once and it worked better for her to do so.   She asked about the dx of CHF on her problem list - she states she was not told at her last visit she had HF. Explained that HF can be a chronic condition that is not an acute concern. Explained that the assessment per MD not did not list HF as a current, active issue. She was concerned that this may need to be reassessed via echo and wanted to know if she should have a repeat test prior to her August 9 appt with MD.

## 2017-02-17 DIAGNOSIS — I110 Hypertensive heart disease with heart failure: Secondary | ICD-10-CM | POA: Diagnosis not present

## 2017-02-17 DIAGNOSIS — M48062 Spinal stenosis, lumbar region with neurogenic claudication: Secondary | ICD-10-CM | POA: Diagnosis not present

## 2017-02-17 DIAGNOSIS — E119 Type 2 diabetes mellitus without complications: Secondary | ICD-10-CM | POA: Diagnosis not present

## 2017-02-17 DIAGNOSIS — G35 Multiple sclerosis: Secondary | ICD-10-CM | POA: Diagnosis not present

## 2017-02-17 DIAGNOSIS — I480 Paroxysmal atrial fibrillation: Secondary | ICD-10-CM | POA: Diagnosis not present

## 2017-02-17 DIAGNOSIS — I509 Heart failure, unspecified: Secondary | ICD-10-CM | POA: Diagnosis not present

## 2017-02-17 NOTE — Telephone Encounter (Signed)
Please advise her to take the increased dose diuretic. Echo was just this past December - not due to repeat.  Dr. Lemmie Evens

## 2017-02-18 ENCOUNTER — Other Ambulatory Visit: Payer: Self-pay | Admitting: Internal Medicine

## 2017-02-19 ENCOUNTER — Telehealth (INDEPENDENT_AMBULATORY_CARE_PROVIDER_SITE_OTHER): Payer: Self-pay

## 2017-02-19 DIAGNOSIS — M48062 Spinal stenosis, lumbar region with neurogenic claudication: Secondary | ICD-10-CM | POA: Diagnosis not present

## 2017-02-19 DIAGNOSIS — E119 Type 2 diabetes mellitus without complications: Secondary | ICD-10-CM | POA: Diagnosis not present

## 2017-02-19 DIAGNOSIS — I480 Paroxysmal atrial fibrillation: Secondary | ICD-10-CM | POA: Diagnosis not present

## 2017-02-19 DIAGNOSIS — I509 Heart failure, unspecified: Secondary | ICD-10-CM | POA: Diagnosis not present

## 2017-02-19 DIAGNOSIS — I110 Hypertensive heart disease with heart failure: Secondary | ICD-10-CM | POA: Diagnosis not present

## 2017-02-19 DIAGNOSIS — G35 Multiple sclerosis: Secondary | ICD-10-CM | POA: Diagnosis not present

## 2017-02-19 NOTE — Telephone Encounter (Signed)
Please advise Toni [name]

## 2017-02-19 NOTE — Telephone Encounter (Signed)
Patient called w/MD advice. Voiced understanding.

## 2017-02-19 NOTE — Telephone Encounter (Signed)
Patient would like a call concerning her fingers being numb.  Stated she received an injection about 2 weeks ago with Artis Delay.  CB# is [phone].  Please Advise.  Thank You.

## 2017-02-20 NOTE — Telephone Encounter (Signed)
Called she is to come in and see [name]

## 2017-02-21 DIAGNOSIS — E119 Type 2 diabetes mellitus without complications: Secondary | ICD-10-CM | POA: Diagnosis not present

## 2017-02-21 DIAGNOSIS — I509 Heart failure, unspecified: Secondary | ICD-10-CM | POA: Diagnosis not present

## 2017-02-21 DIAGNOSIS — I480 Paroxysmal atrial fibrillation: Secondary | ICD-10-CM | POA: Diagnosis not present

## 2017-02-21 DIAGNOSIS — M48062 Spinal stenosis, lumbar region with neurogenic claudication: Secondary | ICD-10-CM | POA: Diagnosis not present

## 2017-02-21 DIAGNOSIS — I110 Hypertensive heart disease with heart failure: Secondary | ICD-10-CM | POA: Diagnosis not present

## 2017-02-21 DIAGNOSIS — G35 Multiple sclerosis: Secondary | ICD-10-CM | POA: Diagnosis not present

## 2017-02-22 DIAGNOSIS — N312 Flaccid neuropathic bladder, not elsewhere classified: Secondary | ICD-10-CM | POA: Diagnosis not present

## 2017-02-22 DIAGNOSIS — N281 Cyst of kidney, acquired: Secondary | ICD-10-CM | POA: Diagnosis not present

## 2017-02-22 DIAGNOSIS — I110 Hypertensive heart disease with heart failure: Secondary | ICD-10-CM | POA: Diagnosis not present

## 2017-02-22 DIAGNOSIS — I509 Heart failure, unspecified: Secondary | ICD-10-CM | POA: Diagnosis not present

## 2017-02-22 DIAGNOSIS — G35 Multiple sclerosis: Secondary | ICD-10-CM | POA: Diagnosis not present

## 2017-02-22 DIAGNOSIS — M48062 Spinal stenosis, lumbar region with neurogenic claudication: Secondary | ICD-10-CM | POA: Diagnosis not present

## 2017-02-22 DIAGNOSIS — E119 Type 2 diabetes mellitus without complications: Secondary | ICD-10-CM | POA: Diagnosis not present

## 2017-02-22 DIAGNOSIS — I480 Paroxysmal atrial fibrillation: Secondary | ICD-10-CM | POA: Diagnosis not present

## 2017-02-22 NOTE — Telephone Encounter (Signed)
LMOM for patient asking her if she can come in and see Artis Delay today at 2

## 2017-02-26 ENCOUNTER — Ambulatory Visit (INDEPENDENT_AMBULATORY_CARE_PROVIDER_SITE_OTHER): Payer: Medicare Other | Admitting: Physician Assistant

## 2017-02-26 DIAGNOSIS — M48062 Spinal stenosis, lumbar region with neurogenic claudication: Secondary | ICD-10-CM | POA: Diagnosis not present

## 2017-02-26 DIAGNOSIS — G35 Multiple sclerosis: Secondary | ICD-10-CM | POA: Diagnosis not present

## 2017-02-26 DIAGNOSIS — I509 Heart failure, unspecified: Secondary | ICD-10-CM | POA: Diagnosis not present

## 2017-02-26 DIAGNOSIS — E119 Type 2 diabetes mellitus without complications: Secondary | ICD-10-CM | POA: Diagnosis not present

## 2017-02-26 DIAGNOSIS — I480 Paroxysmal atrial fibrillation: Secondary | ICD-10-CM | POA: Diagnosis not present

## 2017-02-26 DIAGNOSIS — I110 Hypertensive heart disease with heart failure: Secondary | ICD-10-CM | POA: Diagnosis not present

## 2017-02-27 ENCOUNTER — Ambulatory Visit (INDEPENDENT_AMBULATORY_CARE_PROVIDER_SITE_OTHER): Payer: Medicare Other | Admitting: Pharmacist

## 2017-02-27 DIAGNOSIS — E119 Type 2 diabetes mellitus without complications: Secondary | ICD-10-CM | POA: Diagnosis not present

## 2017-02-27 DIAGNOSIS — I110 Hypertensive heart disease with heart failure: Secondary | ICD-10-CM | POA: Diagnosis not present

## 2017-02-27 DIAGNOSIS — Z7901 Long term (current) use of anticoagulants: Secondary | ICD-10-CM

## 2017-02-27 DIAGNOSIS — I480 Paroxysmal atrial fibrillation: Secondary | ICD-10-CM | POA: Diagnosis not present

## 2017-02-27 DIAGNOSIS — I482 Chronic atrial fibrillation: Secondary | ICD-10-CM

## 2017-02-27 DIAGNOSIS — I4821 Permanent atrial fibrillation: Secondary | ICD-10-CM

## 2017-02-27 DIAGNOSIS — I509 Heart failure, unspecified: Secondary | ICD-10-CM | POA: Diagnosis not present

## 2017-02-27 DIAGNOSIS — M48062 Spinal stenosis, lumbar region with neurogenic claudication: Secondary | ICD-10-CM | POA: Diagnosis not present

## 2017-02-27 DIAGNOSIS — G35 Multiple sclerosis: Secondary | ICD-10-CM | POA: Diagnosis not present

## 2017-02-27 LAB — POCT INR: INR: 1.8

## 2017-03-01 ENCOUNTER — Encounter (INDEPENDENT_AMBULATORY_CARE_PROVIDER_SITE_OTHER): Payer: Medicare Other | Admitting: Family Medicine

## 2017-03-01 DIAGNOSIS — I509 Heart failure, unspecified: Secondary | ICD-10-CM | POA: Diagnosis not present

## 2017-03-01 DIAGNOSIS — I480 Paroxysmal atrial fibrillation: Secondary | ICD-10-CM | POA: Diagnosis not present

## 2017-03-01 DIAGNOSIS — G35 Multiple sclerosis: Secondary | ICD-10-CM | POA: Diagnosis not present

## 2017-03-01 DIAGNOSIS — E119 Type 2 diabetes mellitus without complications: Secondary | ICD-10-CM | POA: Diagnosis not present

## 2017-03-01 DIAGNOSIS — I110 Hypertensive heart disease with heart failure: Secondary | ICD-10-CM | POA: Diagnosis not present

## 2017-03-01 DIAGNOSIS — M48062 Spinal stenosis, lumbar region with neurogenic claudication: Secondary | ICD-10-CM | POA: Diagnosis not present

## 2017-03-02 DIAGNOSIS — I509 Heart failure, unspecified: Secondary | ICD-10-CM | POA: Diagnosis not present

## 2017-03-02 DIAGNOSIS — E119 Type 2 diabetes mellitus without complications: Secondary | ICD-10-CM | POA: Diagnosis not present

## 2017-03-02 DIAGNOSIS — M48062 Spinal stenosis, lumbar region with neurogenic claudication: Secondary | ICD-10-CM | POA: Diagnosis not present

## 2017-03-02 DIAGNOSIS — I480 Paroxysmal atrial fibrillation: Secondary | ICD-10-CM | POA: Diagnosis not present

## 2017-03-02 DIAGNOSIS — I110 Hypertensive heart disease with heart failure: Secondary | ICD-10-CM | POA: Diagnosis not present

## 2017-03-02 DIAGNOSIS — G35 Multiple sclerosis: Secondary | ICD-10-CM | POA: Diagnosis not present

## 2017-03-05 DIAGNOSIS — M48062 Spinal stenosis, lumbar region with neurogenic claudication: Secondary | ICD-10-CM | POA: Diagnosis not present

## 2017-03-05 DIAGNOSIS — I509 Heart failure, unspecified: Secondary | ICD-10-CM | POA: Diagnosis not present

## 2017-03-05 DIAGNOSIS — E119 Type 2 diabetes mellitus without complications: Secondary | ICD-10-CM | POA: Diagnosis not present

## 2017-03-05 DIAGNOSIS — G35 Multiple sclerosis: Secondary | ICD-10-CM | POA: Diagnosis not present

## 2017-03-05 DIAGNOSIS — I110 Hypertensive heart disease with heart failure: Secondary | ICD-10-CM | POA: Diagnosis not present

## 2017-03-05 DIAGNOSIS — I480 Paroxysmal atrial fibrillation: Secondary | ICD-10-CM | POA: Diagnosis not present

## 2017-03-07 DIAGNOSIS — E119 Type 2 diabetes mellitus without complications: Secondary | ICD-10-CM | POA: Diagnosis not present

## 2017-03-07 DIAGNOSIS — M48062 Spinal stenosis, lumbar region with neurogenic claudication: Secondary | ICD-10-CM | POA: Diagnosis not present

## 2017-03-07 DIAGNOSIS — I480 Paroxysmal atrial fibrillation: Secondary | ICD-10-CM | POA: Diagnosis not present

## 2017-03-07 DIAGNOSIS — G35 Multiple sclerosis: Secondary | ICD-10-CM | POA: Diagnosis not present

## 2017-03-07 DIAGNOSIS — I110 Hypertensive heart disease with heart failure: Secondary | ICD-10-CM | POA: Diagnosis not present

## 2017-03-07 DIAGNOSIS — I509 Heart failure, unspecified: Secondary | ICD-10-CM | POA: Diagnosis not present

## 2017-03-08 ENCOUNTER — Encounter (INDEPENDENT_AMBULATORY_CARE_PROVIDER_SITE_OTHER): Payer: Self-pay | Admitting: Family Medicine

## 2017-03-08 ENCOUNTER — Ambulatory Visit (INDEPENDENT_AMBULATORY_CARE_PROVIDER_SITE_OTHER): Payer: Medicare Other | Admitting: Family Medicine

## 2017-03-08 VITALS — BP 116/73 | HR 109 | Temp 98.2°F | Ht 63.0 in | Wt 257.0 lb

## 2017-03-08 DIAGNOSIS — E784 Other hyperlipidemia: Secondary | ICD-10-CM

## 2017-03-08 DIAGNOSIS — R739 Hyperglycemia, unspecified: Secondary | ICD-10-CM

## 2017-03-08 DIAGNOSIS — Z6841 Body Mass Index (BMI) 40.0 and over, adult: Secondary | ICD-10-CM

## 2017-03-08 DIAGNOSIS — E559 Vitamin D deficiency, unspecified: Secondary | ICD-10-CM | POA: Diagnosis not present

## 2017-03-08 DIAGNOSIS — R0602 Shortness of breath: Secondary | ICD-10-CM | POA: Diagnosis not present

## 2017-03-08 DIAGNOSIS — Z0289 Encounter for other administrative examinations: Secondary | ICD-10-CM

## 2017-03-08 DIAGNOSIS — E038 Other specified hypothyroidism: Secondary | ICD-10-CM

## 2017-03-08 DIAGNOSIS — Z1331 Encounter for screening for depression: Secondary | ICD-10-CM

## 2017-03-08 DIAGNOSIS — Z1389 Encounter for screening for other disorder: Secondary | ICD-10-CM | POA: Diagnosis not present

## 2017-03-08 DIAGNOSIS — E669 Obesity, unspecified: Secondary | ICD-10-CM

## 2017-03-08 DIAGNOSIS — E7849 Other hyperlipidemia: Secondary | ICD-10-CM

## 2017-03-08 DIAGNOSIS — R5383 Other fatigue: Secondary | ICD-10-CM | POA: Diagnosis not present

## 2017-03-08 DIAGNOSIS — IMO0001 Reserved for inherently not codable concepts without codable children: Secondary | ICD-10-CM

## 2017-03-08 NOTE — Progress Notes (Signed)
Office: [phone]  /  Fax: [phone]   Dear Dr. Brett [name],   Thank you for referring Toni [name] to our clinic. The following note includes my evaluation and treatment recommendations.  HPI:   Chief Complaint: OBESITY  Toni [name] has been referred by Toni Seat, MD for consultation regarding her obesity and obesity related comorbidities.  Toni [name] (MR# [id number]) is a 71 y.o. female who presents on 03/08/2017 for obesity evaluation and treatment. Current BMI is There is no height or weight on file to calculate BMI.Toni [name] has struggled with obesity for years and has been unsuccessful in either losing weight or maintaining long term weight loss. Toni [name] attended our information session and states she is currently in the action stage of change and ready to dedicate time achieving and maintaining a healthier weight.  Toni [name] states her family eats meals together she thinks her family will eat healthier with  her her desired weight loss is 21 she has been heavy most of  her life she started gaining weight in her 54's her heaviest weight ever was 301 lbs. she has significant food cravings issues  she snacks frequently in the evenings she skips meals frequently she is frequently drinking liquids with calories she has problems with excessive hunger  she frequently eats larger portions than normal  she has binge eating behaviors she struggles with emotional eating    Fatigue Toni [name] feels her energy is lower than it should be. This has worsened with weight gain and has not worsened recently. Toni [name] admits to daytime somnolence and  denies waking up still tired. Patient is at risk for obstructive sleep apnea. Patent has a history of symptoms of daytime fatigue. Patient generally gets 7 or 8 hours of sleep per night, and states they generally have restless sleep. Snoring is not present. Apneic episodes are present. Epworth Sleepiness Score is  7  Dyspnea on exertion Toni [name] notes increasing shortness of breath with most activity and this seems to be worsening over time with weight gain. She notes getting out of breath sooner with activity than she used to. This has not gotten worse recently. She has a history of obesity hypoventilation syndrome.Toni [name] denies orthopnea.  Vitamin D deficiency Toni [name] has a diagnosis of vitamin D deficiency. She is currently taking OTC vit D and calcium. She admits fatigue and denies nausea, vomiting or muscle weakness. She is at high risk of osteopenia due to steroid use.  Hyperglycemia Toni [name] states diabetes diagnosis in chart is due to elevated blood glucose readings while on IV steroids and had to be on insulin, but not diabetic otherwise. She admits to polyphagia.  Hypothyroid Toni [name] has a diagnosis of hypothyroidism. She is on Synthroid currently. She notes heat intolerance and ongoing fatigue.  Hyperlipidemia Toni [name] has hyperlipidemia and is on statins. She is attempting to improve her cholesterol levels with intensive lifestyle modification including a low saturated fat diet, exercise and weight loss. She has neuropathy but no myalgias. She denies any chest pain or claudication.  Depression Screen Toni [name] Food and Mood (modified PHQ-9) score was  Depression screen PHQ 2/9 03/08/2017  Decreased Interest 0  Down, Depressed, Hopeless 1  PHQ - 2 Score 1  Altered sleeping 2  Tired, decreased energy 2  Change in appetite 1  Feeling bad or failure about yourself  0  Trouble concentrating 0  Moving slowly or fidgety/restless 0  Suicidal thoughts 0  PHQ-9 Score 6  Some recent data might be hidden    ALLERGIES:  Allergies  Allergen Reactions   Hydrocodone Nausea And Vomiting    Hydrocodone causes vomiting   Oxycodone Nausea And Vomiting    Oral oxycodone causes vomiting   Chlorhexidine Gluconate Rash    Sores at site   Zoloft [Sertraline] Hives, Swelling and Rash     MEDICATIONS: Current Outpatient Prescriptions on File Prior to Visit  Medication Sig Dispense Refill   acetaminophen (TYLENOL) 500 MG tablet Take 500-1,000 mg by mouth every 6 (six) hours as needed for fever or headache (or pain).     ALPRAZolam (XANAX) 0.5 MG tablet Take 1 tablet (0.5 mg total) by mouth 2 (two) times daily as needed for anxiety. 20 tablet 0   baclofen (LIORESAL) 20 MG tablet Take 1 tablet (20 mg total) by mouth 3 (three) times daily as needed for muscle spasms. (Patient taking differently: Take 20 mg by mouth See admin instructions. Once to twice a day for breakthrough spasms) 180 each 3   buPROPion (WELLBUTRIN XL) 300 MG 24 hr tablet Take 300 mg by mouth daily.     CALCIUM CITRATE PO Take 2 tablets by mouth 2 (two) times daily.     cetirizine (ZYRTEC) 10 MG tablet Take 10 mg by mouth daily.     Cholecalciferol (VITAMIN D) 2000 units tablet Take 2,000 Units by mouth 2 (two) times daily.     co-enzyme Q-10 30 MG capsule Take 30 mg by mouth daily.     corticotropin (ATHACAR H.P.) 80 UNIT/ML injectable gel Inject 80 units subcutaneously once every other day  for a total of 10 days , repeat  every 30 days. 5 mL 5   diltiazem (CARDIZEM CD) 120 MG 24 hr capsule TAKE 1 CAPSULE BY MOUTH  DAILY 90 capsule 0   docusate sodium (COLACE) 100 MG capsule Take 1 capsule (100 mg total) by mouth daily. 10 capsule 0   fluticasone (FLONASE) 50 MCG/ACT nasal spray Place 2 sprays into both nostrils daily as needed for allergies.      furosemide (LASIX) 40 MG tablet Take 1 tablet (40 mg total) by mouth 2 (two) times daily. 180 tablet 3   gabapentin (NEURONTIN) 300 MG capsule TAKE 1 CAPSULE BY MOUTH UP  TO 4 TIMES DAILY (Patient taking differently: TAKE 1 CAPSULE BY MOUTH UP  TO 3 times daily) 360 capsule 3   GENERLAC 10 GM/15ML SOLN TAKE 7.5 ML BY MOUTH TWICE DAILY 900 mL 5   Interferon Beta-1b (BETASERON/EXTAVIA) 0.3 MG KIT injection Every other day , subcutaneus. Please titrate  pt based on normal titration schedule. 1 each 12   levothyroxine (SYNTHROID, LEVOTHROID) 75 MCG tablet Take 75 mcg by mouth daily.     Multiple Vitamins-Minerals (ONE-A-DAY WOMENS 50+ ADVANTAGE) TABS Take 1 tablet by mouth daily.     Nebivolol HCl (BYSTOLIC) 20 MG TABS Take 20 mg by mouth daily.     Omega-3 Fatty Acids (FISH OIL) 1000 MG CAPS Take 1,000 mg by mouth daily.     omeprazole (PRILOSEC) 20 MG capsule Take 20 mg by mouth daily.      psyllium (METAMUCIL SMOOTH TEXTURE) 28 % packet Take 1 packet by mouth every other day.     ranitidine (ZANTAC) 150 MG tablet Take 150 mg by mouth at bedtime.      simvastatin (ZOCOR) 20 MG tablet Take 10 mg by mouth at bedtime.      SYRINGE/NEEDLE, DISP, 1 ML (BD LUER-LOK SYRINGE) 25G X 5/8" 1 ML MISC Use as directed to inject  Acthar 10  each prn   Tamsulosin HCl (FLOMAX) 0.4 MG CAPS Take 0.4 mg by mouth daily.     UNABLE TO FIND Baclofen pump: Continuous     warfarin (COUMADIN) 5 MG tablet TAKE 1/2 TO 1 TABLET BY  MOUTH DAILY AS DIRECTED BY  COUMADIN CLINIC. 90 tablet 0   Current Facility-Administered Medications on File Prior to Visit  Medication Dose Route Frequency Provider Last Rate Last Dose   baclofen (LIORESAL) intrathecal injection 40 mg/86m  40 mg Intrathecal Once WKathrynn Ducking MD        PAST MEDICAL HISTORY: Past Medical History:  Diagnosis Date   Abnormality of gait 03/01/2015   Anxiety    Arthritis    "knees" (01/19/2016)   Atrial fibrillation with RVR ([facility] 01/19/2016   Back pain    Chest pain    Childhood asthma    Chronic pain    "nerve pain from the MS"   Constipation    Depression    [place]   "RLE; may have just been phlebitis; it was before the age of dopplers"   Edema    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' ablation of R GSV 2012   GERD (gastroesophageal reflux disease)    History of stress test 06/21/10   limited exam with some degree  of breast attenuatin of the apex however a component of apical ischemia is not excluded, EF 62%; cardiac cath    HLD (hyperlipidemia)    Hx of echocardiogram 04/22/10   EF 55-60%, no valve issues   Hypertension    Hypothyroidism    Joint pain    Migraine    "none since I stopped beta blocker in 1990s" (01/19/2016)   Multiple sclerosis ([facility]    has had this may 1989-[facility] reg doc   OSA on CPAP    Osteoarthritis    PAF (paroxysmal atrial fibrillation) ([facility]    on coumadin; documented on monitor 06/2010   Pneumonia 11/2011   Shortness of breath    Thyroid disease     PAST SURGICAL HISTORY: Past Surgical History:  Procedure Laterality Date   ANTERIOR CERVICAL DECOMP/DISCECTOMY FUSION  01/2004   BACK SURGERY     CARDIAC CATHETERIZATION  06/22/2010   normal coronary arteries, PAF   CATARACT EXTRACTION, BILATERAL Bilateral    CHILD BIRTHS     X2   DILATION AND CURETTAGE OF UTERUS     INFUSION PUMP IMPLANTATION  ~ 2009   baclofen infusion in lower abd   PAIN PUMP REVISION N/A 07/29/2014   Procedure: Baclofen pump replacement;  Surgeon: JErline Levine MD;  [facility];  Service: Neurosurgery;  Laterality: N/A;  Baclofen pump replacement   TONSILLECTOMY AND ADENOIDECTOMY     VARICOSE VEIN SURGERY  ~ 1968   VENOUS ABLATION  12/16/2010   radiofreq ablation -Dr SElisabeth Caraand Hilty   WISDOM TOOTH EXTRACTION      SOCIAL HISTORY: Social History  Substance Use Topics   Smoking status: Former Smoker    Packs/day: 0.50    Years: 7.00    Types: Cigarettes    Quit date: 08/21/1976   Smokeless tobacco: Never Used   Alcohol use 0.6 oz/week    1 Glasses of wine per week    FAMILY HISTORY: Family History  Problem Relation Age of Onset   Coronary artery disease Father        at age 70  Hyperlipidemia Father    Thyroid  disease Father    Coronary artery disease Maternal Grandmother    Depression Maternal Grandmother    Cancer Paternal Grandmother     Depression Mother    Sudden death Mother    Anxiety disorder Mother    Alcoholism Son     ROS: Review of Systems  Constitutional: Positive for malaise/fatigue.  HENT: Positive for tinnitus.        Hay Fever Dry Mouth  Eyes:       Wear Glasses or Contacts  Respiratory: Positive for shortness of breath (on exertion).   Cardiovascular: Positive for palpitations. Negative for chest pain and claudication.       Calf/Leg Pain with Walking  Gastrointestinal: Positive for constipation. Negative for nausea and vomiting.  Musculoskeletal: Positive for back pain and myalgias.       Muscle or Joint Pain Negative muscle weakness  Skin:       Hair or Nail Changes  Endo/Heme/Allergies: Bruises/bleeds easily (easy bruising).       Polyphagia Heat Intolerance  Psychiatric/Behavioral: Positive for depression.    PHYSICAL EXAM: Blood pressure 116/73, pulse (!) 109, temperature 98.2 F (36.8 C), temperature source Oral, height _0  (1.6 m), SpO2 94 %. There is no height or weight on file to calculate BMI. Physical Exam  Constitutional: She is oriented to person, place, and time. She appears well-developed and well-nourished.  Cardiovascular: Normal rate.   Pulmonary/Chest: Effort normal.  Musculoskeletal: Normal range of motion.  Neurological: She is oriented to person, place, and time.  Skin: Skin is warm and dry.  Psychiatric: She has a normal mood and affect. Her behavior is normal.  Vitals reviewed.   RECENT LABS AND TESTS: BMET    Component Value Date/Time   NA 142 02/14/2017 1212   K 4.6 02/14/2017 1212   CL 98 02/14/2017 1212   CO2 33 (H) 02/14/2017 1212   GLUCOSE 102 (H) 02/14/2017 1212   GLUCOSE 155 (H) 12/27/2016 0410   BUN 18 02/14/2017 1212   CREATININE 0.58 02/14/2017 1212   CALCIUM 9.2 02/14/2017 1212   GFRNONAA 94 02/14/2017 1212   GFRAA 109 02/14/2017 1212   Lab Results  Component Value Date   HGBA1C  04/21/2010    5.5 (NOTE)                                                                        According to the ADA Clinical Practice Recommendations for 2011, when HbA1c is used as a screening test:   >=6.5%   Diagnostic of Diabetes Mellitus           (if abnormal result  is confirmed)  5.7-6.4%   Increased risk of developing Diabetes Mellitus  References:Diagnosis and Classification of Diabetes Mellitus,Diabetes XTKW,4097,35(HGDJM 1):S62-S69 and Standards of Medical Care in         Diabetes - 2011,Diabetes Care,2011,34  (Suppl 1):S11-S61.   No results found for: INSULIN CBC    Component Value Date/Time   WBC 8.7 12/27/2016 0410   RBC 4.31 12/27/2016 0410   HGB 12.7 12/27/2016 0410   HGB 14.9 12/18/2014 1157   HCT 38.5 12/27/2016 0410   HCT 44.6 12/18/2014 1157   PLT 174 12/27/2016 0410   PLT 219 12/18/2014 1157  MCV 89.3 12/27/2016 0410   MCV 89 12/18/2014 1157   MCH 29.5 12/27/2016 0410   MCHC 33.0 12/27/2016 0410   RDW 12.6 12/27/2016 0410   RDW 13.3 12/18/2014 1157   LYMPHSABS 1.8 12/24/2016 1442   LYMPHSABS 2.1 12/18/2014 1157   MONOABS 0.4 12/24/2016 1442   EOSABS 0.2 12/24/2016 1442   EOSABS 0.1 12/18/2014 1157   BASOSABS 0.0 12/24/2016 1442   BASOSABS 0.0 12/18/2014 1157   Iron/TIBC/Ferritin/ %Sat No results found for: IRON, TIBC, FERRITIN, IRONPCTSAT Lipid Panel     Component Value Date/Time   CHOL  04/22/2010 0536    127        ATP III CLASSIFICATION:  <200     mg/dL   Desirable  200-239  mg/dL   Borderline High  >=240    mg/dL   High          TRIG 147 04/22/2010 0536   HDL 43 04/22/2010 0536   CHOLHDL 3.0 04/22/2010 0536   VLDL 29 04/22/2010 0536   LDLCALC  04/22/2010 0536    55        Total Cholesterol/HDL:CHD Risk Coronary Heart Disease Risk Table                     Men   Women  1/2 Average Risk   3.4   3.3  Average Risk       5.0   4.4  2 X Average Risk   9.6   7.1  3 X Average Risk  23.4   11.0        Use the calculated Patient Ratio above and the CHD Risk Table to determine the patient's  CHD Risk.        ATP III CLASSIFICATION (LDL):  <100     mg/dL   Optimal  100-129  mg/dL   Near or Above                    Optimal  130-159  mg/dL   Borderline  160-189  mg/dL   High  >190     mg/dL   Very High   Hepatic Function Panel     Component Value Date/Time   PROT 6.4 (L) 12/24/2016 1442   PROT 5.9 (L) 06/17/2013 1337   ALBUMIN 3.6 12/24/2016 1442   ALBUMIN 3.9 06/17/2013 1337   AST 19 12/24/2016 1442   ALT 19 12/24/2016 1442   ALKPHOS 56 12/24/2016 1442   BILITOT 0.6 12/24/2016 1442      Component Value Date/Time   TSH 1.083 08/06/2016 0647   TSH 1.871 01/19/2016 2302   TSH 0.206 (L) 03/10/2011 1351   TSH 1.636 06/18/2010 1430    ECG  shows NSR with a rate of 99 BPM INDIRECT CALORIMETER done today shows a VO2 of 187 and a REE of 1303.    ASSESSMENT AND PLAN: Other fatigue - Plan: CBC With Differential, Comprehensive metabolic panel  Shortness of breath on exertion  Hyperglycemia - Plan: Hemoglobin A1c, Insulin, random  Other hyperlipidemia - Plan: Lipid Panel With LDL/HDL Ratio  Other specified hypothyroidism - Plan: T3, T4, free, TSH  Vitamin D deficiency - Plan: VITAMIN D 25 Hydroxy (Vit-D Deficiency, Fractures)  Depression screening  Class 3 obesity with serious comorbidity and body mass index (BMI) of 45.0 to 49.9 in adult, unspecified obesity type (HCC)  PLAN: Fatigue Toni [name] was informed that her fatigue may be related to obesity, depression or many other causes. Labs will be  ordered, and in the meanwhile Toni [name] has agreed to work on diet, exercise and weight loss to help with fatigue. Proper sleep hygiene was discussed including the need for 7-8 hours of quality sleep each night. A sleep study was not ordered based on symptoms and Epworth score.  Dyspnea on exertion Toni [name]'s shortness of breath appears to be obesity related and exercise induced. She has agreed to work on weight loss and will eventually gradually increase exercise to  treat her exercise induced shortness of breath. She will continue with her PT as is for now.  If Veanna follows our instructions and loses weight without improvement of her shortness of breath, we will plan to refer to pulmonology. We will monitor this condition regularly. Toni [name] agrees to this plan.  Vitamin D Deficiency Toni [name] was informed that low vitamin D levels contributes to fatigue and are associated with obesity, breast, and colon cancer. She agrees to continue to take OTC vitamin D and will follow up for routine testing of vitamin D, at least 2-3 times per year. She was informed of the risk of over-replacement of vitamin D and agrees to not increase her dose unless he discusses this with [name] first.  Hyperglycemia Fasting labs will be obtained and results with be discussed with Toni [name] in 2 weeks at her follow up visit. In the meanwhile Toni [name] was started on a lower simple carbohydrate diet and will work on weight loss efforts.  Hypothyroid Taia was informed of the importance of good thyroid control to help with weight loss efforts. She was also informed that supertheraputic thyroid levels are dangerous and will not improve weight loss results. We will check labs and Toni [name] agrees to follow up with our clinic in 2 weeks.  Hyperlipidemia Toni [name] was informed of the American Heart Association Guidelines emphasizing intensive lifestyle modifications as the first line treatment for hyperlipidemia. We discussed many lifestyle modifications today in depth, and Toni [name] will continue to work on decreasing saturated fats such as fatty red meat, butter and many fried foods. She will also increase vegetables and lean protein in her diet and continue to work on exercise and weight loss efforts.  Depression Screen Toni [name] had a mildly positive depression screening. Depression is commonly associated with obesity and often results in emotional eating behaviors. We will monitor  this closely and work on CBT to help improve the non-hunger eating patterns. Referral to Psychology may be required if no improvement is seen as she continues in our clinic.  Obesity Toni [name] is currently in the action stage of change and her goal is to continue with weight loss efforts. I recommend Cortlynn begin the structured treatment plan as follows:  She has agreed to follow the Category 2 plan Toni [name] has been instructed to eventually work up to a goal of 150 minutes of combined cardio and strengthening exercise per week for weight loss and overall health benefits. We discussed the following Behavioral Modification Strategies today: increasing lean protein intake, no skipping meals, meal planning & cooking strategies and keeping healthy foods in the home  Toni [name] has agreed to join our obesity program and follow up with our clinic in 2 weeks. She was informed of the importance of frequent follow up visits to maximize her success with intensive lifestyle modifications for her multiple health conditions. She was informed we would discuss her lab results at her next visit unless there is a critical issue that needs to be addressed sooner. Ruth agreed to keep her next visit at the agreed upon  time to discuss these results.  I, Doreene Nest, am acting as transcriptionist for Dennard Nip, MD  I have reviewed the above documentation for accuracy and completeness, and I agree with the above. -Dennard Nip, MD  OBESITY BEHAVIORAL INTERVENTION VISIT  Today's visit was # 1 out of 75.  Starting weight: 273  lbs Starting date: 03/08/17 Today's weight : 273 lbs Today's date: 03/08/2017 Total lbs lost to date: 0 (Patients must lose 7 lbs in the first 6 months to continue with counseling)   ASK: We discussed the diagnosis of obesity with Toni [name] today and Symantha agreed to give [name] permission to discuss obesity behavioral modification therapy today.  ASSESS: Deysy  has the diagnosis of obesity and her BMI today is 63.1 Alphia is in the action stage of change   ADVISE: Havah was educated on the multiple health risks of obesity as well as the benefit of weight loss to improve her health. She was advised of the need for long term treatment and the importance of lifestyle modifications.  AGREE: Multiple dietary modification options and treatment options were discussed and  Shirline agreed to follow the Category 2 plan We discussed the following Behavioral Modification Strategies today: increasing lean protein intake, no skipping meals, meal planning & cooking strategies and keeping healthy foods in the home

## 2017-03-09 LAB — CBC WITH DIFFERENTIAL
BASOS ABS: 0 10*3/uL (ref 0.0–0.2)
Basos: 0 %
EOS (ABSOLUTE): 0.1 10*3/uL (ref 0.0–0.4)
Eos: 2 %
HEMOGLOBIN: 14.1 g/dL (ref 11.1–15.9)
Hematocrit: 43 % (ref 34.0–46.6)
IMMATURE GRANS (ABS): 0 10*3/uL (ref 0.0–0.1)
IMMATURE GRANULOCYTES: 0 %
LYMPHS: 35 %
Lymphocytes Absolute: 2.2 10*3/uL (ref 0.7–3.1)
MCH: 30.4 pg (ref 26.6–33.0)
MCHC: 32.8 g/dL (ref 31.5–35.7)
MCV: 93 fL (ref 79–97)
MONOCYTES: 11 %
Monocytes Absolute: 0.7 10*3/uL (ref 0.1–0.9)
NEUTROS PCT: 52 %
Neutrophils Absolute: 3.3 10*3/uL (ref 1.4–7.0)
RBC: 4.64 x10E6/uL (ref 3.77–5.28)
RDW: 13.3 % (ref 12.3–15.4)
WBC: 6.4 10*3/uL (ref 3.4–10.8)

## 2017-03-09 LAB — COMPREHENSIVE METABOLIC PANEL
ALBUMIN: 4 g/dL (ref 3.6–4.8)
ALT: 23 IU/L (ref 0–32)
AST: 21 IU/L (ref 0–40)
Albumin/Globulin Ratio: 2.1 (ref 1.2–2.2)
Alkaline Phosphatase: 61 IU/L (ref 39–117)
BUN/Creatinine Ratio: 26 (ref 12–28)
BUN: 22 mg/dL (ref 8–27)
Bilirubin Total: 0.3 mg/dL (ref 0.0–1.2)
CO2: 30 mmol/L — AB (ref 20–29)
CREATININE: 0.85 mg/dL (ref 0.57–1.00)
Calcium: 9.3 mg/dL (ref 8.7–10.3)
Chloride: 95 mmol/L — ABNORMAL LOW (ref 96–106)
GFR calc non Af Amer: 70 mL/min/{1.73_m2} (ref >59)
GFR, EST AFRICAN AMERICAN: 81 mL/min/{1.73_m2} (ref >59)
Globulin, Total: 1.9 g/dL (ref 1.5–4.5)
Glucose: 91 mg/dL (ref 65–99)
Potassium: 4.6 mmol/L (ref 3.5–5.2)
Sodium: 141 mmol/L (ref 134–144)
TOTAL PROTEIN: 5.9 g/dL — AB (ref 6.0–8.5)

## 2017-03-09 LAB — HEMOGLOBIN A1C
Est. average glucose Bld gHb Est-mCnc: 108 mg/dL
HEMOGLOBIN A1C: 5.4 % (ref 4.8–5.6)

## 2017-03-09 LAB — LIPID PANEL WITH LDL/HDL RATIO
CHOLESTEROL TOTAL: 143 mg/dL (ref 100–199)
HDL: 57 mg/dL (ref >39)
LDL Calculated: 68 mg/dL (ref 0–99)
LDl/HDL Ratio: 1.2 ratio (ref 0.0–3.2)
TRIGLYCERIDES: 89 mg/dL (ref 0–149)
VLDL CHOLESTEROL CAL: 18 mg/dL (ref 5–40)

## 2017-03-09 LAB — TSH: TSH: 2.41 u[IU]/mL (ref 0.450–4.500)

## 2017-03-09 LAB — T3: T3, Total: 133 ng/dL (ref 71–180)

## 2017-03-09 LAB — INSULIN, RANDOM: INSULIN: 20.4 u[IU]/mL (ref 2.6–24.9)

## 2017-03-09 LAB — VITAMIN D 25 HYDROXY (VIT D DEFICIENCY, FRACTURES): Vit D, 25-Hydroxy: 52.8 ng/mL (ref 30.0–100.0)

## 2017-03-09 LAB — T4, FREE: FREE T4: 1.66 ng/dL (ref 0.82–1.77)

## 2017-03-12 ENCOUNTER — Other Ambulatory Visit: Payer: Self-pay | Admitting: Neurology

## 2017-03-12 ENCOUNTER — Encounter (INDEPENDENT_AMBULATORY_CARE_PROVIDER_SITE_OTHER): Payer: Self-pay | Admitting: Family Medicine

## 2017-03-12 ENCOUNTER — Ambulatory Visit (INDEPENDENT_AMBULATORY_CARE_PROVIDER_SITE_OTHER): Payer: Medicare Other | Admitting: Pharmacist

## 2017-03-12 DIAGNOSIS — E119 Type 2 diabetes mellitus without complications: Secondary | ICD-10-CM | POA: Diagnosis not present

## 2017-03-12 DIAGNOSIS — G35 Multiple sclerosis: Secondary | ICD-10-CM | POA: Diagnosis not present

## 2017-03-12 DIAGNOSIS — M48062 Spinal stenosis, lumbar region with neurogenic claudication: Secondary | ICD-10-CM | POA: Diagnosis not present

## 2017-03-12 DIAGNOSIS — I110 Hypertensive heart disease with heart failure: Secondary | ICD-10-CM | POA: Diagnosis not present

## 2017-03-12 DIAGNOSIS — I482 Chronic atrial fibrillation: Secondary | ICD-10-CM

## 2017-03-12 DIAGNOSIS — I509 Heart failure, unspecified: Secondary | ICD-10-CM | POA: Diagnosis not present

## 2017-03-12 DIAGNOSIS — I480 Paroxysmal atrial fibrillation: Secondary | ICD-10-CM | POA: Diagnosis not present

## 2017-03-12 DIAGNOSIS — Z7901 Long term (current) use of anticoagulants: Secondary | ICD-10-CM

## 2017-03-12 DIAGNOSIS — I4821 Permanent atrial fibrillation: Secondary | ICD-10-CM

## 2017-03-12 LAB — PROTIME-INR: INR: 1.8 — AB (ref <=1.1)

## 2017-03-13 ENCOUNTER — Telehealth: Payer: Self-pay | Admitting: Neurology

## 2017-03-13 ENCOUNTER — Encounter: Payer: Self-pay | Admitting: Neurology

## 2017-03-13 ENCOUNTER — Ambulatory Visit (INDEPENDENT_AMBULATORY_CARE_PROVIDER_SITE_OTHER): Payer: Medicare Other | Admitting: Neurology

## 2017-03-13 VITALS — BP 126/81 | HR 120 | Ht 63.0 in

## 2017-03-13 DIAGNOSIS — G35 Multiple sclerosis: Secondary | ICD-10-CM | POA: Diagnosis not present

## 2017-03-13 MED ORDER — BACLOFEN 40 MG/20ML IT SOLN
80.0000 mg | Freq: Once | INTRATHECAL | Status: AC
  Administered 2017-03-13: 80 mg via INTRATHECAL

## 2017-03-13 NOTE — Progress Notes (Signed)
Please refer to baclofen pump refill note. 

## 2017-03-13 NOTE — Telephone Encounter (Signed)
Please call pt regarding a question she has on a Dec 28 appt message. Also pt states that you usually call her to schedule her refill appts that always happen at noon. Thank you.

## 2017-03-13 NOTE — Telephone Encounter (Signed)
Called pt back. Scheduled next pump refill for 06/26/17 at 12pm, check in 1130am.   She also stated on AVS, its printing out that baclofen pump refill overdue. I advised I am not sure why this is printed on there and we will try and take it off. If we cannot, advised her to disregard that message. She verbalized understanding.

## 2017-03-13 NOTE — Procedures (Signed)
     History:  Hue Steveson is a 71 year old patient with multiple sclerosis with a spastic paraparesis. The patient has noted some increase in spasticity in the evening hours, she is supplementing with oral baclofen.  Baclofen pump refill note  The baclofen pump site was cleaned with Betadine solution. A 21-gauge needle was inserted into the pump port site. Approximately 7 cc of residual baclofen was removed. 40 cc of replacement baclofen was placed into the pump at 2000 mcg/cc concentration.  The pump was reprogrammed for the following settings: The basal rate is 28 g per hour. The rate from 1 AM to 5 AM was increased to a total of 150.2 g, the other settings were kept the same. There is a total of 712.1 g per day.  The alarm volume is set at 1.5 mL. The next alarm date is 06/29/2017.  The patient tolerated the procedure well. There were no complications of the above procedure.  The [place] number is [phone] The baclofen expiration date is 03/21/2019. The baclofen lot number is [id number].

## 2017-03-14 ENCOUNTER — Telehealth: Payer: Self-pay | Admitting: Neurology

## 2017-03-14 ENCOUNTER — Other Ambulatory Visit: Payer: Self-pay | Admitting: Neurology

## 2017-03-14 ENCOUNTER — Telehealth: Payer: Self-pay | Admitting: *Deleted

## 2017-03-14 DIAGNOSIS — G35 Multiple sclerosis: Secondary | ICD-10-CM | POA: Diagnosis not present

## 2017-03-14 DIAGNOSIS — I110 Hypertensive heart disease with heart failure: Secondary | ICD-10-CM | POA: Diagnosis not present

## 2017-03-14 DIAGNOSIS — I480 Paroxysmal atrial fibrillation: Secondary | ICD-10-CM | POA: Diagnosis not present

## 2017-03-14 DIAGNOSIS — M48062 Spinal stenosis, lumbar region with neurogenic claudication: Secondary | ICD-10-CM | POA: Diagnosis not present

## 2017-03-14 DIAGNOSIS — I509 Heart failure, unspecified: Secondary | ICD-10-CM | POA: Diagnosis not present

## 2017-03-14 DIAGNOSIS — E119 Type 2 diabetes mellitus without complications: Secondary | ICD-10-CM | POA: Diagnosis not present

## 2017-03-14 MED ORDER — CORTICOTROPIN 80 UNIT/ML IJ GEL: INTRAMUSCULAR | 5 refills | Status: DC

## 2017-03-14 NOTE — Telephone Encounter (Signed)
Patient calling. She was seen yesterday by Dr. [name] and had baclofen pump refilled and adjusted. Was baclofen  increased in the day and night or just the night?  She needs to discuss because her legs are weak and wobbly.

## 2017-03-14 NOTE — Telephone Encounter (Signed)
Called patient. Advised letter ready that she requested from Dr [name] to appeal claim for transportation by [facility]. She would like letter mailed to her. I verified address on file. Advised I will place in mail today for her. She verbalized understanding.

## 2017-03-14 NOTE — Telephone Encounter (Signed)
Dr. Willis- FYI 

## 2017-03-14 NOTE — Telephone Encounter (Signed)
The medication was increased in the evening hours, if the patient is having increased weakness in the legs, she can come back into the office and I will readjust the rate on the pump.  She does not need an appointment for this.

## 2017-03-14 NOTE — Telephone Encounter (Signed)
Patient called office to confirm she did receive Dr. [name]' call and voiced understanding.  She will call if she needs to come in to be seen.

## 2017-03-14 NOTE — Telephone Encounter (Signed)
Noted, thank you

## 2017-03-19 ENCOUNTER — Encounter: Payer: Self-pay | Admitting: *Deleted

## 2017-03-20 DIAGNOSIS — I110 Hypertensive heart disease with heart failure: Secondary | ICD-10-CM | POA: Diagnosis not present

## 2017-03-20 DIAGNOSIS — E119 Type 2 diabetes mellitus without complications: Secondary | ICD-10-CM | POA: Diagnosis not present

## 2017-03-20 DIAGNOSIS — M48062 Spinal stenosis, lumbar region with neurogenic claudication: Secondary | ICD-10-CM | POA: Diagnosis not present

## 2017-03-20 DIAGNOSIS — I509 Heart failure, unspecified: Secondary | ICD-10-CM | POA: Diagnosis not present

## 2017-03-20 DIAGNOSIS — G35 Multiple sclerosis: Secondary | ICD-10-CM | POA: Diagnosis not present

## 2017-03-20 DIAGNOSIS — I480 Paroxysmal atrial fibrillation: Secondary | ICD-10-CM | POA: Diagnosis not present

## 2017-03-22 ENCOUNTER — Ambulatory Visit (INDEPENDENT_AMBULATORY_CARE_PROVIDER_SITE_OTHER): Payer: Medicare Other | Admitting: Family Medicine

## 2017-03-22 VITALS — BP 123/83 | HR 81 | Temp 98.7°F | Ht 63.0 in | Wt 252.0 lb

## 2017-03-22 DIAGNOSIS — R7303 Prediabetes: Secondary | ICD-10-CM | POA: Insufficient documentation

## 2017-03-22 DIAGNOSIS — Z6841 Body Mass Index (BMI) 40.0 and over, adult: Secondary | ICD-10-CM | POA: Diagnosis not present

## 2017-03-22 DIAGNOSIS — E559 Vitamin D deficiency, unspecified: Secondary | ICD-10-CM | POA: Diagnosis not present

## 2017-03-22 DIAGNOSIS — E669 Obesity, unspecified: Secondary | ICD-10-CM | POA: Diagnosis not present

## 2017-03-22 DIAGNOSIS — IMO0001 Reserved for inherently not codable concepts without codable children: Secondary | ICD-10-CM | POA: Insufficient documentation

## 2017-03-22 MED ORDER — VITAMIN D 50 MCG (2000 UT) PO TABS: 2000.0000 [IU] | ORAL_TABLET | Freq: Two times a day (BID) | ORAL | 0 refills | Status: DC

## 2017-03-22 NOTE — Progress Notes (Signed)
Office: [phone]  /  Fax: [phone]   HPI:   Chief Complaint: OBESITY Toni [name] is here to discuss her progress with her obesity treatment plan. She is on the  follow the Category 2 plan and is following her eating plan approximately 100 % of the time. She states she is exercising 0 minutes 0 times per week. Toni [name] did well with weight loss in the last 2 weeks. She is unsteady on her feet so the weight may not be exact. Toni [name] worked hard to follow her steady lean protein, low carbohydrate diet and noted that hunger was controlled. Her weight is 252 lb (114.3 kg) today and has had a weight loss of 5 pounds over a period of 2 weeks since her last visit. She has lost 5 lbs since starting treatment with [name].  Vitamin D deficiency Toni [name] has a diagnosis of vitamin D deficiency. She is currently taking  OTC vit D 2,000 IU bid and level is now at goal. Toni [name] still notes fatigue  and denies nausea, vomiting or muscle weakness.  Pre-Diabetes Toni [name] has a diagnosis of pre-diabetes. She is on chronic steroids and has had some mildly elevated fasting glucose levels and was informed this puts her at greater risk of developing diabetes. Her Hgb A1c was within normal limits but her fasting insulin was >5. She is not taking metformin currently and continues to work on diet and exercise to decrease risk of diabetes. Polyphagia improved on lower simple carbohydrate diet and she denies nausea or hypoglycemia.   ALLERGIES: Allergies  Allergen Reactions  . Hydrocodone Nausea And Vomiting    Hydrocodone causes vomiting  . Oxycodone Nausea And Vomiting    Oral oxycodone causes vomiting  . Chlorhexidine Gluconate Rash    Sores at site  . Zoloft [Sertraline] Hives, Swelling and Rash    MEDICATIONS: Current Outpatient Prescriptions on File Prior to Visit  Medication Sig Dispense Refill  . acetaminophen (TYLENOL) 500 MG tablet Take 500-1,000 mg by mouth every 6 (six) hours as needed  for fever or headache (or pain).    Marland Kitchen ALPRAZolam (XANAX) 0.5 MG tablet Take 1 tablet (0.5 mg total) by mouth 2 (two) times daily as needed for anxiety. 20 tablet 0  . baclofen (LIORESAL) 20 MG tablet Take 1 tablet (20 mg total) by mouth 3 (three) times daily as needed for muscle spasms. (Patient taking differently: Take 20 mg by mouth See admin instructions. Once to twice a day for breakthrough spasms) 180 each 3  . buPROPion (WELLBUTRIN XL) 300 MG 24 hr tablet Take 300 mg by mouth daily.    Marland Kitchen CALCIUM CITRATE PO Take 2 tablets by mouth 2 (two) times daily.    . cetirizine (ZYRTEC) 10 MG tablet Take 10 mg by mouth daily.    . Cholecalciferol (VITAMIN D) 2000 units tablet Take 2,000 Units by mouth 2 (two) times daily.    Marland Kitchen co-enzyme Q-10 30 MG capsule Take 30 mg by mouth daily.    . corticotropin (ATHACAR H.P.) 80 UNIT/ML injectable gel Inject 80 units subcutaneously once every other day  for a total of 10 days , repeat  every 30 days. 5 mL 5  . diltiazem (CARDIZEM CD) 120 MG 24 hr capsule TAKE 1 CAPSULE BY MOUTH  DAILY 90 capsule 0  . docusate sodium (COLACE) 100 MG capsule Take 1 capsule (100 mg total) by mouth daily. 10 capsule 0  . fluticasone (FLONASE) 50 MCG/ACT nasal spray Place 2 sprays into both nostrils daily as needed  for allergies.     . furosemide (LASIX) 40 MG tablet Take 1 tablet (40 mg total) by mouth 2 (two) times daily. 180 tablet 3  . gabapentin (NEURONTIN) 300 MG capsule TAKE 1 CAPSULE BY MOUTH UP  TO 4 TIMES DAILY (Patient taking differently: TAKE 1 CAPSULE BY MOUTH UP  TO 3 times daily) 360 capsule 3  . GENERLAC 10 GM/15ML SOLN TAKE 7.5 ML BY MOUTH TWICE DAILY 900 mL 5  . Interferon Beta-1b (BETASERON/EXTAVIA) 0.3 MG KIT injection Every other day , subcutaneus. Please titrate pt based on normal titration schedule. 1 each 12  . levothyroxine (SYNTHROID, LEVOTHROID) 75 MCG tablet Take 75 mcg by mouth daily.    . Multiple Vitamins-Minerals (CENTRUM SILVER PO) Take 1 tablet by mouth  daily.    . Nebivolol HCl (BYSTOLIC) 20 MG TABS Take 20 mg by mouth daily.    . Omega-3 Fatty Acids (FISH OIL) 1000 MG CAPS Take 1,000 mg by mouth daily.    Marland Kitchen omeprazole (PRILOSEC) 20 MG capsule Take 20 mg by mouth daily.     . psyllium (METAMUCIL SMOOTH TEXTURE) 28 % packet Take 1 packet by mouth every other day.    . ranitidine (ZANTAC) 150 MG tablet Take 150 mg by mouth at bedtime.     . simvastatin (ZOCOR) 20 MG tablet Take 10 mg by mouth at bedtime.     . SYRINGE/NEEDLE, DISP, 1 ML (BD LUER-LOK SYRINGE) 25G X 5/8" 1 ML MISC Use as directed to inject  Acthar 10 each prn  . Tamsulosin HCl (FLOMAX) 0.4 MG CAPS Take 0.4 mg by mouth daily.    Marland Kitchen UNABLE TO FIND Baclofen pump: Continuous    . warfarin (COUMADIN) 5 MG tablet TAKE 1/2 TO 1 TABLET BY  MOUTH DAILY AS DIRECTED BY  COUMADIN CLINIC. 90 tablet 0   Current Facility-Administered Medications on File Prior to Visit  Medication Dose Route Frequency Provider Last Rate Last Dose  . baclofen (LIORESAL) intrathecal injection 40 mg/49m  40 mg Intrathecal Once WKathrynn Ducking MD        PAST MEDICAL HISTORY: Past Medical History:  Diagnosis Date  . Abnormality of gait 03/01/2015  . Anxiety   . Arthritis    "knees" (01/19/2016)  . Atrial fibrillation with [place]VR ([place] 01/19/2016  . Back pain   . Chest pain   . Childhood asthma   . Chronic pain    "nerve pain from the MS"  . Constipation   . Depression   . DVT (deep venous thrombosis) ([facility]   "RLE; may have just been phlebitis; it was before the age of dopplers"  . 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 GERD (gastroesophageal reflux disease)   . History of stress test 06/21/10   limited exam with some degree of breast attenuatin of the apex however a component of apical ischemia is not excluded, EF 62%; cardiac cath   . HLD (hyperlipidemia)   . Hx of echocardiogram 04/22/10   EF 55-60%, no valve issues  . Hypertension   .  Hypothyroidism   . Joint pain   . Migraine    "none since I stopped beta blocker in 1990s" (01/19/2016)  . Multiple sclerosis ([facility]    has had this may 1989-[facility] reg doc  . OSA on CPAP   . Osteoarthritis   . PAF (paroxysmal atrial fibrillation) ([facility])    on coumadin; documented on  monitor 06/2010  . Pneumonia 11/2011  . Shortness of breath   . Thyroid disease     PAST SURGICAL HISTORY: Past Surgical History:  Procedure Laterality Date  . ANTERIOR CERVICAL DECOMP/DISCECTOMY FUSION  01/2004  . BACK SURGERY    . CARDIAC CATHETERIZATION  06/22/2010   normal coronary arteries, PAF  . CATARACT EXTRACTION, BILATERAL Bilateral   . CHILD BIRTHS     X2  . DILATION AND CURETTAGE OF UTERUS    . INFUSION PUMP IMPLANTATION  ~ 2009   baclofen infusion in lower abd  . PAIN PUMP REVISION N/A 07/29/2014   Procedure: Baclofen pump replacement;  Surgeon: Erline Levine, MD;  Location: [facility];  Service: Neurosurgery;  Laterality: N/A;  Baclofen pump replacement  . TONSILLECTOMY AND ADENOIDECTOMY    . VARICOSE VEIN SURGERY  ~ 1968  . VENOUS ABLATION  12/16/2010   radiofreq ablation -Dr Elisabeth Cara and [facility]  . WISDOM TOOTH EXTRACTION      SOCIAL HISTORY: Social History  Substance Use Topics  . Smoking status: Former Smoker    Packs/day: 0.50    Years: 7.00    Types: Cigarettes    Quit date: 08/21/1976  . Smokeless tobacco: Never Used  . Alcohol use 0.6 oz/week    1 Glasses of wine per week    FAMILY HISTORY: Family History  Problem Relation Age of Onset  . Coronary artery disease Father        at age 60  . Hyperlipidemia Father   . Thyroid disease Father   . Coronary artery disease Maternal Grandmother   . Depression Maternal Grandmother   . Cancer Paternal Grandmother   . Depression Mother   . Sudden death Mother   . Anxiety disorder Mother   . Alcoholism Son     ROS: Review of Systems  Constitutional: Positive for malaise/fatigue and weight loss.  Gastrointestinal: Negative  for nausea.  Musculoskeletal:       Negative muscle weakness  Endo/Heme/Allergies:       Polyphagia Negative hypoglycemia    PHYSICAL EXAM: Blood pressure 123/83, pulse 81, temperature 98.7 F (37.1 C), temperature source Oral, height _0  (1.6 m), weight 252 lb (114.3 kg), SpO2 98 %. Body mass index is 44.64 kg/m. Physical Exam  Constitutional: She is oriented to person, place, and time. She appears well-developed and well-nourished.  Cardiovascular: Normal rate.   Pulmonary/Chest: Effort normal.  Musculoskeletal: Normal range of motion.  Neurological: She is oriented to person, place, and time.  Skin: Skin is warm and dry.  Psychiatric: She has a normal mood and affect. Her behavior is normal.  Vitals reviewed.   RECENT LABS AND TESTS: BMET    Component Value Date/Time   NA 141 03/08/2017 1152   K 4.6 03/08/2017 1152   CL 95 (L) 03/08/2017 1152   CO2 30 (H) 03/08/2017 1152   GLUCOSE 91 03/08/2017 1152   GLUCOSE 155 (H) 12/27/2016 0410   BUN 22 03/08/2017 1152   CREATININE 0.85 03/08/2017 1152   CALCIUM 9.3 03/08/2017 1152   GFRNONAA 70 03/08/2017 1152   GFRAA 81 03/08/2017 1152   Lab Results  Component Value Date   HGBA1C 5.4 03/08/2017   HGBA1C  04/21/2010    5.5 (NOTE)  According to the ADA Clinical Practice Recommendations for 2011, when HbA1c is used as a screening test:   >=6.5%   Diagnostic of Diabetes Mellitus           (if abnormal result  is confirmed)  5.7-6.4%   Increased risk of developing Diabetes Mellitus  References:Diagnosis and Classification of Diabetes Mellitus,Diabetes ASTM,1962,22(LNLGX 1):S62-S69 and Standards of Medical Care in         Diabetes - 2011,Diabetes QJJH,4174,08  (Suppl 1):S11-S61.   Lab Results  Component Value Date   INSULIN 20.4 03/08/2017   CBC    Component Value Date/Time   WBC 6.4 03/08/2017 1152   WBC 8.7 12/27/2016 0410   RBC 4.64 03/08/2017 1152     RBC 4.31 12/27/2016 0410   HGB 14.1 03/08/2017 1152   HCT 43.0 03/08/2017 1152   PLT 174 12/27/2016 0410   PLT 219 12/18/2014 1157   MCV 93 03/08/2017 1152   MCH 30.4 03/08/2017 1152   MCH 29.5 12/27/2016 0410   MCHC 32.8 03/08/2017 1152   MCHC 33.0 12/27/2016 0410   RDW 13.3 03/08/2017 1152   LYMPHSABS 2.2 03/08/2017 1152   MONOABS 0.4 12/24/2016 1442   EOSABS 0.1 03/08/2017 1152   BASOSABS 0.0 03/08/2017 1152   Iron/TIBC/Ferritin/ %Sat No results found for: IRON, TIBC, FERRITIN, IRONPCTSAT Lipid Panel     Component Value Date/Time   CHOL 143 03/08/2017 1152   TRIG 89 03/08/2017 1152   HDL 57 03/08/2017 1152   CHOLHDL 3.0 04/22/2010 0536   VLDL 29 04/22/2010 0536   LDLCALC 68 03/08/2017 1152   Hepatic Function Panel     Component Value Date/Time   PROT 5.9 (L) 03/08/2017 1152   ALBUMIN 4.0 03/08/2017 1152   AST 21 03/08/2017 1152   ALT 23 03/08/2017 1152   ALKPHOS 61 03/08/2017 1152   BILITOT 0.3 03/08/2017 1152      Component Value Date/Time   TSH 2.410 03/08/2017 1152   TSH 1.083 08/06/2016 0647   TSH 1.871 01/19/2016 2302   TSH 0.206 (L) 03/10/2011 1351   TSH 1.636 06/18/2010 1430    ASSESSMENT AND PLAN: Vitamin D deficiency  Prediabetes  Class 3 obesity with serious comorbidity and body mass index (BMI) of 40.0 to 44.9 in adult, unspecified obesity type ([place])  PLAN:  Vitamin D Deficiency Toni [name] was informed that low vitamin D levels contributes to fatigue and are associated with obesity, breast, and colon cancer. She agrees to continue to take OTC Vit D @ 2,000 IU bid and we will re-check labs in 3 to 4 months and will follow up for routine testing of vitamin D, at least 2-3 times per year. She was informed of the risk of over-replacement of vitamin D and agrees to not increase her dose unless he discusses this with [name] first. Toni [name] agrees to follow up with our clinic in 2 weeks.  Pre-Diabetes Toni [name] will continue to work on weight loss,  exercise, and decreasing simple carbohydrates in her diet to help decrease the risk of diabetes. We dicussed metformin including benefits and risks. She was informed that eating too many simple carbohydrates or too many calories at one sitting increases the likelihood of GI side effects. Toni [name] defers metformin for now and a prescription was not written today. We will re-check labs in 3 months and Toni [name] agreed to follow up with [name] as directed to monitor her progress.  Obesity Toni [name] is currently in the action stage of change. As such, her goal is to continue  with weight loss efforts She has agreed to follow the Category 2 plan Toni [name] has been instructed to work up to a goal of 150 minutes of combined cardio and strengthening exercise per week for weight loss and overall health benefits. We discussed the following Behavioral Modification Strategies today: increasing lean protein intake, decreasing simple carbohydrates , decrease eating out, dealing with family or coworker sabotage and ways to avoid boredom eating  Toni [name] has agreed to follow up with our clinic in 2 weeks. She was informed of the importance of frequent follow up visits to maximize her success with intensive lifestyle modifications for her multiple health conditions.  I, Doreene Nest, am acting as transcriptionist for Dennard Nip, MD  I have reviewed the above documentation for accuracy and completeness, and I agree with the above. -Dennard Nip, MD   OBESITY BEHAVIORAL INTERVENTION VISIT  Today's visit was # 2 out of 67.  Starting weight: 257 lbs Starting date: 03/08/17 Today's weight : 252 lbs Today's date: 03/22/2017 Total lbs lost to date: 5 (Patients must lose 7 lbs in the first 6 months to continue with counseling)   ASK: We discussed the diagnosis of obesity with Toni [name] today and Toni [name] agreed to give [name] permission to discuss obesity behavioral modification therapy  today.  ASSESS: Toni [name] has the diagnosis of obesity and her BMI today is 44.7 Toni [name] is in the action stage of change   ADVISE: Toni [name] was educated on the multiple health risks of obesity as well as the benefit of weight loss to improve her health. She was advised of the need for long term treatment and the importance of lifestyle modifications.  AGREE: Multiple dietary modification options and treatment options were discussed and  Toni [name] agreed to follow the Category 2 plan We discussed the following Behavioral Modification Strategies today: increasing lean protein intake, decreasing simple carbohydrates , decrease eating out, dealing with family or coworker sabotage and ways to avoid boredom eating

## 2017-03-26 ENCOUNTER — Encounter: Payer: Self-pay | Admitting: Neurology

## 2017-03-26 ENCOUNTER — Ambulatory Visit (INDEPENDENT_AMBULATORY_CARE_PROVIDER_SITE_OTHER): Payer: Medicare Other | Admitting: Neurology

## 2017-03-26 VITALS — BP 107/68 | HR 76 | Ht 63.0 in | Wt 253.0 lb

## 2017-03-26 DIAGNOSIS — I482 Chronic atrial fibrillation: Secondary | ICD-10-CM | POA: Diagnosis not present

## 2017-03-26 DIAGNOSIS — I5032 Chronic diastolic (congestive) heart failure: Secondary | ICD-10-CM

## 2017-03-26 DIAGNOSIS — R0681 Apnea, not elsewhere classified: Secondary | ICD-10-CM | POA: Diagnosis not present

## 2017-03-26 DIAGNOSIS — G35 Multiple sclerosis: Secondary | ICD-10-CM

## 2017-03-26 DIAGNOSIS — I4821 Permanent atrial fibrillation: Secondary | ICD-10-CM

## 2017-03-26 MED ORDER — ALPRAZOLAM 0.5 MG PO TABS: 0.5000 mg | ORAL_TABLET | Freq: Two times a day (BID) | ORAL | 0 refills | Status: DC | PRN

## 2017-03-26 NOTE — Patient Instructions (Signed)
Alprazolam tablets What is this medicine? ALPRAZOLAM (al PRAY zoe lam) is a benzodiazepine. It is used to treat anxiety and panic attacks. This medicine may be used for other purposes; ask your health care provider or pharmacist if you have questions. COMMON BRAND NAME(S): Xanax What should I tell my health care provider before I take this medicine? They need to know if you have any of these conditions: -an alcohol or drug abuse problem -bipolar disorder, depression, psychosis or other mental health conditions -glaucoma -kidney or liver disease -lung or breathing disease -myasthenia gravis -Parkinson's disease -porphyria -seizures or a history of seizures -suicidal thoughts -an unusual or allergic reaction to alprazolam, other benzodiazepines, foods, dyes, or preservatives -pregnant or trying to get pregnant -breast-feeding How should I use this medicine? Take this medicine by mouth with a glass of water. Follow the directions on the prescription label. Take your medicine at regular intervals. Do not take it more often than directed. Do not stop taking except on your doctor's advice. A special MedGuide will be given to you by the pharmacist with each prescription and refill. Be sure to read this information carefully each time. Talk to your pediatrician regarding the use of this medicine in children. Special care may be needed. Overdosage: If you think you have taken too much of this medicine contact a poison control center or emergency room at once. NOTE: This medicine is only for you. Do not share this medicine with others. What if I miss a dose? If you miss a dose, take it as soon as you can. If it is almost time for your next dose, take only that dose. Do not take double or extra doses. What may interact with this medicine? Do not take this medicine with any of the following medications: -certain antiviral medicines for HIV or AIDS like delavirdine, indinavir -certain medicines for  fungal infections like ketoconazole and itraconazole -narcotic medicines for cough -sodium oxybate This medicine may also interact with the following medications: -alcohol -antihistamines for allergy, cough and cold -certain antibiotics like clarithromycin, erythromycin, isoniazid, rifampin, rifapentine, rifabutin, and troleandomycin -certain medicines for blood pressure, heart disease, irregular heart beat -certain medicines for depression, like amitriptyline, fluoxetine, sertraline -certain medicines for seizures like carbamazepine, oxcarbazepine, phenobarbital, phenytoin, primidone -cimetidine -cyclosporine -female hormones, like estrogens or progestins and birth control pills, patches, rings, or injections -general anesthetics like halothane, isoflurane, methoxyflurane, propofol -grapefruit juice -local anesthetics like lidocaine, pramoxine, tetracaine -medicines that relax muscles for surgery -narcotic medicines for pain -other antiviral medicines for HIV or AIDS -phenothiazines like chlorpromazine, mesoridazine, prochlorperazine, thioridazine This list may not describe all possible interactions. Give your health care provider a list of all the medicines, herbs, non-prescription drugs, or dietary supplements you use. Also tell them if you smoke, drink alcohol, or use illegal drugs. Some items may interact with your medicine. What should I watch for while using this medicine? Tell your doctor or health care professional if your symptoms do not start to get better or if they get worse. Do not stop taking except on your doctor's advice. You may develop a severe reaction. Your doctor will tell you how much medicine to take. You may get drowsy or dizzy. Do not drive, use machinery, or do anything that needs mental alertness until you know how this medicine affects you. To reduce the risk of dizzy and fainting spells, do not stand or sit up quickly, especially if you are an older patient.  Alcohol may increase dizziness and drowsiness. Avoid alcoholic  drinks. If you are taking another medicine that also causes drowsiness, you may have more side effects. Give your health care provider a list of all medicines you use. Your doctor will tell you how much medicine to take. Do not take more medicine than directed. Call emergency for help if you have problems breathing or unusual sleepiness. What side effects may I notice from receiving this medicine? Side effects that you should report to your doctor or health care professional as soon as possible: -allergic reactions like skin rash, itching or hives, swelling of the face, lips, or tongue -breathing problems -confusion -loss of balance or coordination -signs and symptoms of low blood pressure like dizziness; feeling faint or lightheaded, falls; unusually weak or tired -suicidal thoughts or other mood changes Side effects that usually do not require medical attention (report to your doctor or health care professional if they continue or are bothersome): -dizziness -dry mouth -nausea, vomiting -tiredness This list may not describe all possible side effects. Call your doctor for medical advice about side effects. You may report side effects to FDA at 1-800-FDA-1088. Where should I keep my medicine? Keep out of the reach of children. This medicine can be abused. Keep your medicine in a safe place to protect it from theft. Do not share this medicine with anyone. Selling or giving away this medicine is dangerous and against the law. Store at room temperature between 20 and 25 degrees C (68 and 77 degrees F). This medicine may cause accidental overdose and death if taken by other adults, children, or pets. Mix any unused medicine with a substance like cat litter or coffee grounds. Then throw the medicine away in a sealed container like a sealed bag or a coffee can with a lid. Do not use the medicine after the expiration date. NOTE: This sheet is  a summary. It may not cover all possible information. If you have questions about this medicine, talk to your doctor, pharmacist, or health care provider.  2018 Elsevier/Gold Standard (2015-05-06 13:47:25)  

## 2017-03-26 NOTE — Progress Notes (Signed)
Provider:  [name], M D  Referring Provider: Prince Solian, MD Primary Care Physician:  Prince Solian, MD  Chief Complaint  Patient presents with  . Follow-up    HPI:  Toni [name] is a 71 y.o. female seen here as a revisit for MS follow up.   The patient has had her baclofen pump replaced in December 2015 and March 2016 . The pump refill went well.  Before the pump was replaced, she had noted a higher and higher need for oral baclofen supplement. She uses ACTH to avoid the previously steroid insuced a fib , she has less weight gain and less HTN on the ACTH. Her right leg is weaker than last year, but she has not fallen. She is her for a routine visit , has no acute questions.  Her extensive history is re viewed in previous noted.    OSA : Now with [facility], where she was given an AIRFIT F 10- a FFM. She had a lot of air leaking by using a nasal pillow, but because of her dexterity problems in relation to MS it is much harder for her to use a full face mask with multiple straps buttons and release clips. I would like for her to return to whatever mask she can manipulate by herself the easiest, this would not be a dream wear , nor a FFM .  We will ignore the air leaks! Has a home pulseoximetry at home, she has sufficient 02 levels.  Neurogenic bladder :She still wakes up at night.-She wakes up every 2 hours with urinary bladder spasms. The patient was discussed prescribed hyoscyamine by her urologist, Dr. Gaynelle Arabian, but then the medication was recalled. She did not feel however for the short time that she used it but it gave her much benefit. She also has samples of scopolamine now at home she reports. She was also offered bladder spasm treatments with Botox but since she has to cath herself in order to undergo that treatment she is worried that her current dexterity would not allow for sufficient self-catheterization. MS; Toni [name]'s fine motor skills and ambulatory  skills have significantly declined over the last 5 years - she is now mostly seen here in a wheelchair, but she can transfer. She continues to use the baclofen pump which is followed by Dr. [name]. Cognitive decline was a concern but 3 sequenced  [facility] cognitive assessment tests showed 30 and 29 out of 30 points and word fluency of 13 words.  She also endorsed the Epworth sleepiness score at 11 points and the fatigue severity score was not endorsed. Odema: severe ankle edema and veinous insufficiency. On diuretics. Not using compression stockings.  She has anasarca when she wakes first in the morning. She has low albumin. She takes a protein rich diet. She is losing muscle mass.  Osteopenia. Arthritis - once a week for mobility maintenance. I will renew her prescription.   Interval history from 03/26/2017.  By long-standing patient Toni [name] is here today for a follow-up visit. 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 She has started on a BiPAP setting of 20/15 cm water and has been 100% compliance for the last 30 days with an average of 7 hours and 42 minutes. The residual AHI is still 13.9 area advanced home care did call her and told her that there with 3 days consecutively during which her AHI was markedly reduced.  This occurred during May, and it related to [name] being given at double dose of diuretics , while she was  At [facility] at the time. It was most nights after a high diuretic was given that she slept the best and happily with congestive central apnea. In the meantime, her cardiologist, Dr. Debara Pickett, has increased Lasix from 60-80 mg daily but she has not had the same effect as she had at 100 mg daily. She is continuously in a fib now. No ablation as long as apnea is not treated.   Dr. Leafy Ro did an extensive lab panel,  Good HB a1c. She was low on protein- and she sees a nutritionist. [name] is now cooking for her. Lots  of chicken and vegetables- and she enjoys the food.   MS stable on Acthar. No diabetes on acthar. Melatonin helps sleep. Given that Toni [name] has suffered from [place] for the last 25 years, almost 30 years now she endorsed the fatigue score of only 24 points, and a Epworth sleepiness score of 13 points which I don't consider too bad.   The patient still uses Betaseron injections and has a skin lesion on her right 5 that seems to be an irritated injection site lasting now for over 3 weeks. The area is red, but not hot, not indurated and there are no blisters. I asked her to use an antihistamine cream or prednisone cream to see if he can reduce the size of the lesion which is now good 3 inches diameter. Mrs. Formosa also has a bruise, almost a blister, in her popliteal fossa at the exact location where her stockings and. She has not worn compression stockings for 3 weeks and I asked her not to wear them until the skin lesion has healed, this may also benefit from the same cream. It is not moist it is not weeping. The Pt at her home is checking it , applied neosporin.   Review of Systems: See above     [facility] Cognitive Assessment  10/04/2015 06/02/2015 03/30/2015  Visuospatial/ Executive (0/5) '4 5 5  ' Naming (0/3) '3 3 3  ' Attention: Read list of digits (0/2) '2 2 2  ' Attention: Read list of letters (0/1) '1 1 1  ' Attention: Serial 7 subtraction starting at 100 (0/3) '3 3 3  ' Language: Repeat phrase (0/2) '2 2 2  ' Language : Fluency (0/1) '1 1 1  ' Abstraction (0/2) '2 2 2  ' Delayed Recall (0/5) '5 5 5  ' Orientation (0/6) '6 6 6  ' Total '29 30 30  ' Adjusted Score (based on education) '29 30 30         ' Social History   Social History  . Marital status: Married    Spouse name: [name]  . Number of children: 2  . Years of education: COLLEGE   Occupational History  . RETIRED    Social History Main Topics  . Smoking status: Former Smoker    Packs/day: 0.50    Years: 7.00    Types: Cigarettes    Quit date:  08/21/1976  . Smokeless tobacco: Never Used  . Alcohol use 0.6 oz/week    1 Glasses of wine per week  . Drug use: No  . Sexual activity: Not Currently   Other Topics Concern  . Not on file   Social History Narrative   Lives in [place] with Tillman Sers [phone], [phone]   Currently in rehab       Family History  Problem Relation Age of Onset  .  Coronary artery disease Father        at age 39  . Hyperlipidemia Father   . Thyroid disease Father   . Coronary artery disease Maternal Grandmother   . Depression Maternal Grandmother   . Cancer Paternal Grandmother   . Depression Mother   . Sudden death Mother   . Anxiety disorder Mother   . Alcoholism Son     Past Medical History:  Diagnosis Date  . Abnormality of gait 03/01/2015  . Anxiety   . Arthritis    "knees" (01/19/2016)  . Atrial fibrillation with RVR ([place]) 01/19/2016  . Back pain   . Chest pain   . Childhood asthma   . Chronic pain    "nerve pain from the MS"  . Constipation   . Depression   . DVT (deep venous thrombosis) ([place]) 1983   "RLE; may have just been phlebitis; it was before the age of dopplers"  . 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 GERD (gastroesophageal reflux disease)   . History of stress test 06/21/10   limited exam with some degree of breast attenuatin of the apex however a component of apical ischemia is not excluded, EF 62%; cardiac cath   . HLD (hyperlipidemia)   . Hx of echocardiogram 04/22/10   EF 55-60%, no valve issues  . Hypertension   . Hypothyroidism   . Joint pain   . Migraine    "none since I stopped beta blocker in 1990s" (01/19/2016)  . Multiple sclerosis ([place])    has had this may 1989-[place] reg doc  . OSA on CPAP   . Osteoarthritis   . PAF (paroxysmal atrial fibrillation) ([place])    on coumadin; documented on monitor 06/2010  . Pneumonia 11/2011  . Shortness of breath   . Thyroid disease     Current Outpatient  Prescriptions  Medication Sig Dispense Refill  . acetaminophen (TYLENOL) 500 MG tablet Take 500-1,000 mg by mouth every 6 (six) hours as needed for fever or headache (or pain).    Marland Kitchen ALPRAZolam (XANAX) 0.5 MG tablet Take 1 tablet (0.5 mg total) by mouth 2 (two) times daily as needed for anxiety. 20 tablet 0  . baclofen (LIORESAL) 20 MG tablet Take 1 tablet (20 mg total) by mouth 3 (three) times daily as needed for muscle spasms. (Patient taking differently: Take 20 mg by mouth See admin instructions. Once to twice a day for breakthrough spasms) 180 each 3  . buPROPion (WELLBUTRIN XL) 300 MG 24 hr tablet Take 300 mg by mouth daily.    Marland Kitchen CALCIUM CITRATE PO Take 2 tablets by mouth 2 (two) times daily.    . cetirizine (ZYRTEC) 10 MG tablet Take 10 mg by mouth daily.    . Cholecalciferol (VITAMIN D) 2000 units tablet Take 1 tablet (2,000 Units total) by mouth 2 (two) times daily.  0  . co-enzyme Q-10 30 MG capsule Take 30 mg by mouth daily.    . corticotropin (ATHACAR H.P.) 80 UNIT/ML injectable gel Inject 80 units subcutaneously once every other day  for a total of 10 days , repeat  every 30 days. 5 mL 5  . diltiazem (CARDIZEM CD) 120 MG 24 hr capsule TAKE 1 CAPSULE BY MOUTH  DAILY 90 capsule 0  . docusate sodium (COLACE) 100 MG capsule Take 1 capsule (100 mg total) by mouth daily. 10 capsule 0  . fluticasone (FLONASE) 50 MCG/ACT  nasal spray Place 2 sprays into both nostrils daily as needed for allergies.     . furosemide (LASIX) 40 MG tablet Take 1 tablet (40 mg total) by mouth 2 (two) times daily. 180 tablet 3  . gabapentin (NEURONTIN) 300 MG capsule TAKE 1 CAPSULE BY MOUTH UP  TO 4 TIMES DAILY (Patient taking differently: TAKE 1 CAPSULE BY MOUTH UP  TO 3 times daily) 360 capsule 3  . GENERLAC 10 GM/15ML SOLN TAKE 7.5 ML BY MOUTH TWICE DAILY 900 mL 5  . Interferon Beta-1b (BETASERON/EXTAVIA) 0.3 MG KIT injection Every other day , subcutaneus. Please titrate pt based on normal titration schedule. 1 each  12  . levothyroxine (SYNTHROID, LEVOTHROID) 75 MCG tablet Take 75 mcg by mouth daily.    . Multiple Vitamins-Minerals (CENTRUM SILVER PO) Take 1 tablet by mouth daily.    . Nebivolol HCl (BYSTOLIC) 20 MG TABS Take 20 mg by mouth daily.    . Omega-3 Fatty Acids (FISH OIL) 1000 MG CAPS Take 1,000 mg by mouth daily.    Marland Kitchen omeprazole (PRILOSEC) 20 MG capsule Take 20 mg by mouth daily.     . psyllium (METAMUCIL SMOOTH TEXTURE) 28 % packet Take 1 packet by mouth every other day.    . ranitidine (ZANTAC) 150 MG tablet Take 150 mg by mouth at bedtime.     . simvastatin (ZOCOR) 20 MG tablet Take 10 mg by mouth at bedtime.     . SYRINGE/NEEDLE, DISP, 1 ML (BD LUER-LOK SYRINGE) 25G X 5/8" 1 ML MISC Use as directed to inject  Acthar 10 each prn  . Tamsulosin HCl (FLOMAX) 0.4 MG CAPS Take 0.4 mg by mouth daily.    Marland Kitchen UNABLE TO FIND Baclofen pump: Continuous    . warfarin (COUMADIN) 5 MG tablet TAKE 1/2 TO 1 TABLET BY  MOUTH DAILY AS DIRECTED BY  COUMADIN CLINIC. 90 tablet 0   Current Facility-Administered Medications  Medication Dose Route Frequency Provider Last Rate Last Dose  . baclofen (LIORESAL) intrathecal injection 40 mg/48m  40 mg Intrathecal Once WKathrynn Ducking MD        Allergies as of 03/26/2017 - Review Complete 03/26/2017  Allergen Reaction Noted  . Hydrocodone Nausea And Vomiting 11/26/2011  . Oxycodone Nausea And Vomiting 05/12/2013  . Chlorhexidine gluconate Rash 11/26/2011  . Zoloft [sertraline] Hives, Swelling, and Rash 05/15/2013    Vitals: BP 107/68   Pulse 76   Ht '5\' 3"'  (1.6 m)   Wt 253 lb (114.8 kg) Comment: weighed last thurs in weightloss clinic  BMI 44.82 kg/m  Last Weight:  Wt Readings from Last 1 Encounters:  03/26/17 253 lb (114.8 kg)   Last Height:   Ht Readings from Last 1 Encounters:  03/26/17 '5\' 3"'  (1.6 m)    Physical exam:   Height ;  5.4 ", 260 pounds,   General: The patient is awake, alert and appears not in acute distress. The patient is well  groomed.  Head: Normocephalic, atraumatic. Neck is supple, circumference is over 19 inches. .  Mallampati 4 , nasion patent . Cardiovascular: irregular rate and rhythm today, , without murmurs or carotid bruit, and without distended neck veins.  Respiratory: Lungs are clear to auscultation.  Skin:  edema,  Rash, Injection site reaction. Thighs are bruised from injection. Popliteal fossa bruised on the left -   Trunk: BMI is elevated. Neurologic exam : The patient is awake and alert, oriented to place and time.  Memory subjective described as intact. There  is a normal attention span & concentration ability.  Speech without dysarthria, dysphonia , delayed wordfinding- aphasia.  Mood and affect are appropriate.   Mrs. Dilmore presents today in a wheelchair, seated. She also made me aware of her wrists on both sides having developed what looks almost like ganglion cysts on top of the wrist attached to the bone limiting her ability to curl her hand or flex the wrist or hyper extend the wrist. I would like for her primary care physician to have a look at this. Overall she has the same symmetric muscle tone (right, there is no specific weakness in either hip flexion addiction shoulder shrug)  Nor tremor,  handwriting changes  Cranial nerves:  Pupils are equal and briskly reactive to light.  Extraocular movements in vertical and horizontal planes intact and without nystagmus. Visual fields by finger perimetry are intact.  Hearing to finger rub intact. Facial sensation intact to fine touch.  Facial motor strength is symmetric and tongue and uvula move midline.   Deep tendon reflexes: in the upper and lower extremities are attenuated, right Babinski positive- up going.   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 Continues Baclofen PUMP follow up  with Dr [name].  ACTHAR for progressive MS treatment in a patient who could not tolerate  seroids due to atrial fibrillation - now chronic.    No MRI - she is not interested  In another imaging study,  Acthar- 5 shots a month - this is the use schedule she prefers :  uses it on her bathing, doctors appointment  and rehab days.   CSA/ complex apneas.  - on CPAP.  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  Causes of CSA . Fluid retention.    OSA is also weight related: Obesity Her goal is to be able to stand on the scale at Dr. Susy Frizzle office, and she can do that when she took Acthar beforehand.   Refill of xanax- uses one bottle over 11 month.   RV in 3 month   [name] MD  03/26/2017

## 2017-03-27 DIAGNOSIS — E119 Type 2 diabetes mellitus without complications: Secondary | ICD-10-CM | POA: Diagnosis not present

## 2017-03-27 DIAGNOSIS — I480 Paroxysmal atrial fibrillation: Secondary | ICD-10-CM | POA: Diagnosis not present

## 2017-03-27 DIAGNOSIS — G35 Multiple sclerosis: Secondary | ICD-10-CM | POA: Diagnosis not present

## 2017-03-27 DIAGNOSIS — I110 Hypertensive heart disease with heart failure: Secondary | ICD-10-CM | POA: Diagnosis not present

## 2017-03-27 DIAGNOSIS — I509 Heart failure, unspecified: Secondary | ICD-10-CM | POA: Diagnosis not present

## 2017-03-27 DIAGNOSIS — M48062 Spinal stenosis, lumbar region with neurogenic claudication: Secondary | ICD-10-CM | POA: Diagnosis not present

## 2017-03-29 ENCOUNTER — Ambulatory Visit (INDEPENDENT_AMBULATORY_CARE_PROVIDER_SITE_OTHER): Payer: Medicare Other | Admitting: Pharmacist

## 2017-03-29 ENCOUNTER — Encounter: Payer: Self-pay | Admitting: Internal Medicine

## 2017-03-29 ENCOUNTER — Ambulatory Visit (INDEPENDENT_AMBULATORY_CARE_PROVIDER_SITE_OTHER): Payer: Medicare Other | Admitting: Internal Medicine

## 2017-03-29 VITALS — BP 102/60 | HR 78 | Ht 63.0 in | Wt 253.0 lb

## 2017-03-29 DIAGNOSIS — Z7901 Long term (current) use of anticoagulants: Secondary | ICD-10-CM

## 2017-03-29 DIAGNOSIS — I872 Venous insufficiency (chronic) (peripheral): Secondary | ICD-10-CM

## 2017-03-29 DIAGNOSIS — I5033 Acute on chronic diastolic (congestive) heart failure: Secondary | ICD-10-CM

## 2017-03-29 DIAGNOSIS — I4821 Permanent atrial fibrillation: Secondary | ICD-10-CM

## 2017-03-29 DIAGNOSIS — R600 Localized edema: Secondary | ICD-10-CM

## 2017-03-29 DIAGNOSIS — R1900 Intra-abdominal and pelvic swelling, mass and lump, unspecified site: Secondary | ICD-10-CM

## 2017-03-29 DIAGNOSIS — I482 Chronic atrial fibrillation: Secondary | ICD-10-CM | POA: Diagnosis not present

## 2017-03-29 LAB — POCT INR: INR: 1.9

## 2017-03-29 MED ORDER — FUROSEMIDE 40 MG PO TABS: 80.0000 mg | ORAL_TABLET | Freq: Two times a day (BID) | ORAL | 3 refills | Status: DC

## 2017-03-29 NOTE — Patient Instructions (Addendum)
Your physician has recommended you make the following change in your medication:  -- INCREASE lasix to 80mg  twice daily  Your physician recommends that you return for lab work in [place] recommends that you schedule a follow-up appointment in: [place] with Dr. Debara Pickett

## 2017-03-29 NOTE — Progress Notes (Signed)
OFFICE NOTE  Chief Complaint:  Continued swelling  Primary Care Physician: Prince Solian, MD  HPI:  Toni [name] is a 71 year old female with multiple sclerosis, paroxysmal atrial fibrillation, on Coumadin, morbid obesity, obstructive sleep apnea, and dyslipidemia. She has had worsening swelling in her legs, thought to be due to an MS flare. She has a history of varicose veins with severe venous insufficiency in both the right and left greater saphenous veins. The right greater saphenous vein was actually successfully ablated by my partner, Dr. Elisabeth Cara, about 1 to 2 years ago. She does have residual reflux in the left greater saphenous vein extending from the proximal calf up to the saphenofemoral junction. The vein measured between 4 and 8 mm with between 1 and 6 seconds of reflux. We had discussed ablation of that before, but she has canceled several times at procedures. At this point, I think there is little benefit to continuing to try for her to undergo ablation. 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 She can wear work showed a compression stockings and this does seem to help her. With regards to her atrial fibrillation she has not had significant recurrence to her knowledge. She continues on warfarin and has been therapeutic, with her INR of 1.9 in the office by fingerstick today.  Toni [name] returns today for followup. She reports her MS is somewhat progressive. She continues to have some enlargement of her legs but no significant swelling. There is a mild varicosities in the right lower extremity. She is unaware of her atrial fibrillation. INR today was 2.3.   I saw Toni [name] back in the office today,  She is describing more shortness of breath and feels that she's been in A. Fib more recently. In fact today6/21her EKG does show persistent atrial fibrillation. In the past she's been paroxysmal , but it may be that she is more frequently  having A. Fib. She has trouble telling whether not she is more short of breath or symptomatic from A. Fib versus her multiple sclerosis. She is also reporting more swelling in her left leg.  Toni [name] returns for follow-up today. She recently saw her neurologist regarding her MS which seems to be flaring. In addition she was noted to have significant apneic events despite using CPAP. There is concern for possible central sleep apnea. Both of these episodes are probably contributing to her recurrent atrial fibrillation. She's been wearing a monitor for a period of time which indicates a burden of A. fib of about 80% of the time. It does not appear that she is in persistent A. fib, rather she has had some episodes of sinus rhythm, but they're much less frequent than her A. Fib.  I saw Toni [name] back today in the office. Recently I saw her as an add-on visit briefly as she was describing some chest pain when she was getting her INR checked. An EKG at that time showed no ischemia. I did not feel that this was cardiac. She continues intermittently have abnormal pains both in her chest as well as her abdomen and extremities. This may be related to MS. She's had difficulty with CPAP and apparently getting a good mask fit. She has been a persistent atrial fibrillation for about 6 months. It's unclear whether or not she symptomatic with this and therefore I have not pursued a rhythm control strategy. Her rate control is generally pretty good and she has been therapeutic on  warfarin. She is reporting worsening swelling of her legs although her weight she says is been stable. She has a history of venous insufficiency and had a right greater saphenous vein ablation by one of my partners. She's concerned about more swelling in her left leg and whether she's developed worsening venous insufficiency. She also reports cold feet with numbness in her extremities. As she is basically wheelchair bound, I cannot assess whether she  is having any claudication.  Toni [name] returns today for follow-up. She underwent venous and arterial lower external Dopplers. Arterial Dopplers were normal which showed no evidence of obstruction. Her venous Doppler showed no DVT. There was significant venous reflux in the left greater saphenous vein. This is an area where she has swelling and discomfort. This may be amenable to treatment. I increased her Lasix and she reports some improvement in her leg swelling. She's trying to elevate her feet and reports she is intolerant of wearing compression stockings.  02/03/2016  Toni [name] was seen today in the office for follow-up of recent hospitalization. She was seeing her neurologist and complained of left chest and left arm pain and was referred to the hospital. She ruled out for MI and was felt that her chest pain was on coronary. She underwent an echocardiogram which showed low-normal EF but was also found to be in A. fib with rapid ventricular response. Her A. fib is persistent at this time but the rate was elevated. She was started on Cardizem which helped improve her rate and was discharged on Cardizem CD 120 mg daily. Heart rate today is better controlled in the 70s. She reports her arm pain is improved and she said today that she "did not feel that it was her heart".  05/09/2016  Toni [name] was seen in the office for follow-up today. She reports improved heart rate control with heart rates between 50 and 80 on diltiazem. Over this will help with her mild cardiomyopathy with EF 50-55% on recent echo. She reports some increased fluid gain particularly in her abdomen. This may be due to some degree of right heart dysfunction. She's working with her neurologist for MS as well as her sleep apnea. There is a component of central sleep apnea and she is reportedly going to have additional sleep study to look for the need for BiPAP therapy. She denies any recurrent chest pain or left arm  pain.  02/14/2017  Toni [name] was seen today in follow-up. She was recently hospitalized. She said problems with increasing fluid gain. I increased her Lasix last time however when she was hospitalized she required high-dose Lasix and says she has she felt better with that. She subsequently been put on BiPAP therapy. She says her sleeping is improved significantly. She is concerned though recently she's had some increased weight gain. She feels like she may need an increasing dose of diuretic. Her last echo was in December 2017 which showed an EF of 55%. This is been stable. If this is some heart failure, may be related diastolic dysfunction however suspect is more likely related to autonomic dysfunction the setting of multiple sclerosis.  03/29/2017  Toni [name] returns today for follow-up. She continues to report lower extremity edema and abdominal swelling. We'll increase her Lasix however she has not noted significant weight loss. It is actually very difficult to estimate her weight because of her MS. We tried to place her on the scale today however she was unable to stand long enough to get an accurate  weight. She reports urination was not as good on 40 mg of Lasix but is currently taking 80 mg once daily and has better urination with that.  PMHx:  Past Medical History:  Diagnosis Date  . Abnormality of gait 03/01/2015  . Anxiety   . Arthritis    "knees" (01/19/2016)  . Atrial fibrillation with RVR ([place]) 01/19/2016  . Back pain   . Chest pain   . Childhood asthma   . Chronic pain    "nerve pain from the MS"  . Constipation   . Depression   . DVT (deep venous thrombosis) ([place]) 1983   "RLE; may have just been phlebitis; it was before the age of dopplers"  . 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 GERD (gastroesophageal reflux disease)   . History of stress test 06/21/10   limited exam with some degree of breast attenuatin of the apex  however a component of apical ischemia is not excluded, EF 62%; cardiac cath   . HLD (hyperlipidemia)   . Hx of echocardiogram 04/22/10   EF 55-60%, no valve issues  . Hypertension   . Hypothyroidism   . Joint pain   . Migraine    "none since I stopped beta blocker in 1990s" (01/19/2016)  . Multiple sclerosis ([place])    has had this may 1989-[facility] reg doc  . OSA on CPAP   . Osteoarthritis   . PAF (paroxysmal atrial fibrillation) ([place])    on coumadin; documented on monitor 06/2010  . Pneumonia 11/2011  . Shortness of breath   . Thyroid disease     Past Surgical History:  Procedure Laterality Date  . ANTERIOR CERVICAL DECOMP/DISCECTOMY FUSION  01/2004  . BACK SURGERY    . CARDIAC CATHETERIZATION  06/22/2010   normal coronary arteries, PAF  . CATARACT EXTRACTION, BILATERAL Bilateral   . CHILD BIRTHS     X2  . DILATION AND CURETTAGE OF UTERUS    . INFUSION PUMP IMPLANTATION  ~ 2009   baclofen infusion in lower abd  . PAIN PUMP REVISION N/A 07/29/2014   Procedure: Baclofen pump replacement;  Surgeon: Erline Levine, MD;  Location: [facility];  Service: Neurosurgery;  Laterality: N/A;  Baclofen pump replacement  . TONSILLECTOMY AND ADENOIDECTOMY    . VARICOSE VEIN SURGERY  ~ 1968  . VENOUS ABLATION  12/16/2010   radiofreq ablation -Dr Elisabeth Cara and [facility]  . WISDOM TOOTH EXTRACTION      FAMHx:  Family History  Problem Relation Age of Onset  . Coronary artery disease Father        at age 73  . Hyperlipidemia Father   . Thyroid disease Father   . Coronary artery disease Maternal Grandmother   . Depression Maternal Grandmother   . Cancer Paternal Grandmother   . Depression Mother   . Sudden death Mother   . Anxiety disorder Mother   . Alcoholism Son     SOCHx:   reports that she quit smoking about 40 years ago. Her smoking use included Cigarettes. She has a 3.50 pack-year smoking history. She has never used smokeless tobacco. She reports that she drinks about 0.6 oz of alcohol  per week . She reports that she does not use drugs.  ALLERGIES:  Allergies  Allergen Reactions  . Hydrocodone Nausea And Vomiting    Hydrocodone causes vomiting  . Oxycodone Nausea And Vomiting    Oral oxycodone causes vomiting  .  Chlorhexidine Gluconate Rash    Sores at site  . Zoloft [Sertraline] Hives, Swelling and Rash    ROS: Pertinent items noted in HPI and remainder of comprehensive ROS otherwise negative.  HOME MEDS: Current Outpatient Prescriptions  Medication Sig Dispense Refill  . acetaminophen (TYLENOL) 500 MG tablet Take 500-1,000 mg by mouth every 6 (six) hours as needed for fever or headache (or pain).    Marland Kitchen ALPRAZolam (XANAX) 0.5 MG tablet Take 1 tablet (0.5 mg total) by mouth 2 (two) times daily as needed for anxiety. 20 tablet 0  . baclofen (LIORESAL) 20 MG tablet Take 1 tablet (20 mg total) by mouth 3 (three) times daily as needed for muscle spasms. (Patient taking differently: Take 20 mg by mouth See admin instructions. Once to twice a day for breakthrough spasms) 180 each 3  . buPROPion (WELLBUTRIN XL) 300 MG 24 hr tablet Take 300 mg by mouth daily.    Marland Kitchen CALCIUM CITRATE PO Take 2 tablets by mouth 2 (two) times daily.    . cetirizine (ZYRTEC) 10 MG tablet Take 10 mg by mouth daily.    . Cholecalciferol (VITAMIN D) 2000 units tablet Take 1 tablet (2,000 Units total) by mouth 2 (two) times daily.  0  . co-enzyme Q-10 30 MG capsule Take 30 mg by mouth daily.    . corticotropin (ATHACAR H.P.) 80 UNIT/ML injectable gel Inject 80 units subcutaneously once every other day  for a total of 10 days , repeat  every 30 days. 5 mL 5  . diltiazem (CARDIZEM CD) 120 MG 24 hr capsule TAKE 1 CAPSULE BY MOUTH  DAILY 90 capsule 0  . docusate sodium (COLACE) 100 MG capsule Take 1 capsule (100 mg total) by mouth daily. 10 capsule 0  . fluticasone (FLONASE) 50 MCG/ACT nasal spray Place 2 sprays into both nostrils daily as needed for allergies.     . furosemide (LASIX) 40 MG tablet Take 1  tablet (40 mg total) by mouth 2 (two) times daily. 180 tablet 3  . gabapentin (NEURONTIN) 300 MG capsule TAKE 1 CAPSULE BY MOUTH UP  TO 4 TIMES DAILY (Patient taking differently: TAKE 1 CAPSULE BY MOUTH UP  TO 3 times daily) 360 capsule 3  . GENERLAC 10 GM/15ML SOLN TAKE 7.5 ML BY MOUTH TWICE DAILY 900 mL 5  . Interferon Beta-1b (BETASERON/EXTAVIA) 0.3 MG KIT injection Every other day , subcutaneus. Please titrate pt based on normal titration schedule. 1 each 12  . levothyroxine (SYNTHROID, LEVOTHROID) 75 MCG tablet Take 75 mcg by mouth daily.    . Multiple Vitamins-Minerals (CENTRUM SILVER PO) Take 1 tablet by mouth daily.    . Nebivolol HCl (BYSTOLIC) 20 MG TABS Take 20 mg by mouth daily.    . Omega-3 Fatty Acids (FISH OIL) 1000 MG CAPS Take 1,000 mg by mouth daily.    Marland Kitchen omeprazole (PRILOSEC) 20 MG capsule Take 20 mg by mouth daily.     . psyllium (METAMUCIL SMOOTH TEXTURE) 28 % packet Take 1 packet by mouth every other day.    . ranitidine (ZANTAC) 150 MG tablet Take 150 mg by mouth at bedtime.     . simvastatin (ZOCOR) 20 MG tablet Take 10 mg by mouth at bedtime.     . SYRINGE/NEEDLE, DISP, 1 ML (BD LUER-LOK SYRINGE) 25G X 5/8" 1 ML MISC Use as directed to inject  Acthar 10 each prn  . Tamsulosin HCl (FLOMAX) 0.4 MG CAPS Take 0.4 mg by mouth daily.    Marland Kitchen UNABLE  TO FIND Baclofen pump: Continuous    . warfarin (COUMADIN) 5 MG tablet TAKE 1/2 TO 1 TABLET BY  MOUTH DAILY AS DIRECTED BY  COUMADIN CLINIC. 90 tablet 0   Current Facility-Administered Medications  Medication Dose Route Frequency Provider Last Rate Last Dose  . baclofen (LIORESAL) intrathecal injection 40 mg/63m  40 mg Intrathecal Once WKathrynn Ducking MD        LABS/IMAGING: No results found for this or any previous visit (from the past 48 hour(s)). No results found.  VITALS: BP 102/60   Pulse 78   Ht 5' 3" (1.6 m)   Wt 253 lb (114.8 kg) Comment: Patient weighed at weight loss.  BMI 44.82 kg/m    EXAM: Deferred  EKG: Deferred  ASSESSMENT: 1. Persistent atrial fibrillation - 80% frequency of A. Fib (not clear if symptomatic) 2. Morbid obesity 3. Obstructive sleep apnea on BIPAP 4. Multiple sclerosis 5. Chronic anticoagulation on warfarin - INR 2.3 today 6. Chronic venous insufficiency - +LGSV reflux 7. Hypertension-controlled 8. Noncardiac chest and left arm pain 9. Non-ischemic cardiomyopathy - EF 50%, improved to 55% (07/2016) 10. Acute on chronic diastolic congestive heart failure  PLAN: 1.   Mrs. HLiptakcontinues to have significant abdominal tightness and lower extremity swelling. It's extremely difficult to judge her volume status on physical exam due to her overweight and we cannot get accurate weights because of her MS and her significant weakness, she cannot stand long enough to obtain a weight on the scale. Her BNP was greater than 700 suggesting volume overload and she reports no significant increase in diuresis unless she takes the 80 mg Lasix. This may be her threshold dose. We'll go ahead and try to increase the Lasix to 80 mg twice a day. We'll repeat a BNP and BMP in 1 week. Plan follow-up with me in one month.  Toni Casino MD, FI[facility] Army Community HospitalAttending Cardiologist CLexington8/04/2017, 3:01 PM

## 2017-03-31 NOTE — Progress Notes (Signed)
Dear Toni [name], I agree with tyour assessment and plan as outlined 03-29-2017 . Mrs Nawrot is well known to me . I have not been successful in reducing her sleep apnea to a degree that would  Protect her from recurrent a fib.     Shiara Mcgough, MD

## 2017-04-01 DIAGNOSIS — G4733 Obstructive sleep apnea (adult) (pediatric): Secondary | ICD-10-CM | POA: Diagnosis not present

## 2017-04-01 DIAGNOSIS — M48062 Spinal stenosis, lumbar region with neurogenic claudication: Secondary | ICD-10-CM | POA: Diagnosis not present

## 2017-04-01 DIAGNOSIS — I110 Hypertensive heart disease with heart failure: Secondary | ICD-10-CM | POA: Diagnosis not present

## 2017-04-01 DIAGNOSIS — F329 Major depressive disorder, single episode, unspecified: Secondary | ICD-10-CM | POA: Diagnosis not present

## 2017-04-01 DIAGNOSIS — F419 Anxiety disorder, unspecified: Secondary | ICD-10-CM | POA: Diagnosis not present

## 2017-04-01 DIAGNOSIS — G35 Multiple sclerosis: Secondary | ICD-10-CM | POA: Diagnosis not present

## 2017-04-01 DIAGNOSIS — I509 Heart failure, unspecified: Secondary | ICD-10-CM | POA: Diagnosis not present

## 2017-04-01 DIAGNOSIS — E785 Hyperlipidemia, unspecified: Secondary | ICD-10-CM | POA: Diagnosis not present

## 2017-04-01 DIAGNOSIS — I480 Paroxysmal atrial fibrillation: Secondary | ICD-10-CM | POA: Diagnosis not present

## 2017-04-01 DIAGNOSIS — E119 Type 2 diabetes mellitus without complications: Secondary | ICD-10-CM | POA: Diagnosis not present

## 2017-04-02 DIAGNOSIS — I110 Hypertensive heart disease with heart failure: Secondary | ICD-10-CM | POA: Diagnosis not present

## 2017-04-02 DIAGNOSIS — E119 Type 2 diabetes mellitus without complications: Secondary | ICD-10-CM | POA: Diagnosis not present

## 2017-04-02 DIAGNOSIS — G35 Multiple sclerosis: Secondary | ICD-10-CM | POA: Diagnosis not present

## 2017-04-02 DIAGNOSIS — M48062 Spinal stenosis, lumbar region with neurogenic claudication: Secondary | ICD-10-CM | POA: Diagnosis not present

## 2017-04-02 DIAGNOSIS — I480 Paroxysmal atrial fibrillation: Secondary | ICD-10-CM | POA: Diagnosis not present

## 2017-04-02 DIAGNOSIS — I509 Heart failure, unspecified: Secondary | ICD-10-CM | POA: Diagnosis not present

## 2017-04-05 ENCOUNTER — Ambulatory Visit (INDEPENDENT_AMBULATORY_CARE_PROVIDER_SITE_OTHER): Payer: Medicare Other | Admitting: Family Medicine

## 2017-04-05 VITALS — BP 120/82 | HR 74 | Temp 98.7°F | Ht 63.0 in

## 2017-04-05 DIAGNOSIS — IMO0001 Reserved for inherently not codable concepts without codable children: Secondary | ICD-10-CM

## 2017-04-05 DIAGNOSIS — E669 Obesity, unspecified: Secondary | ICD-10-CM

## 2017-04-05 DIAGNOSIS — I5043 Acute on chronic combined systolic (congestive) and diastolic (congestive) heart failure: Secondary | ICD-10-CM | POA: Diagnosis not present

## 2017-04-05 DIAGNOSIS — Z6841 Body Mass Index (BMI) 40.0 and over, adult: Secondary | ICD-10-CM

## 2017-04-05 NOTE — Progress Notes (Signed)
Office: [phone]  /  Fax: [phone]   HPI:   Chief Complaint: OBESITY Toni [name] is here to discuss her progress with her obesity treatment plan. She is on the Category 2 plan and is following her eating plan approximately 99.99 % of the time. She states she is exercising physical therapy for 60 minutes 2 times per week. Toni [name] is unable to stand to get a weight today. She is trying to follow her diet prescription closely but is struggling with following the details of her diet. Toni [name] was unable to stand to weigh today. She has lost 4 lbs since starting treatment with [name].  Congestive Heart Failure Toni [name] has a diagnosis of congestive heart failure. Her cardiologist doubled her Lasix but she isn't doing this every day.  ALLERGIES: Allergies  Allergen Reactions   Hydrocodone Nausea And Vomiting    Hydrocodone causes vomiting   Oxycodone Nausea And Vomiting    Oral oxycodone causes vomiting   Chlorhexidine Gluconate Rash    Sores at site   Zoloft [Sertraline] Hives, Swelling and Rash    MEDICATIONS: Current Outpatient Prescriptions on File Prior to Visit  Medication Sig Dispense Refill   acetaminophen (TYLENOL) 500 MG tablet Take 500-1,000 mg by mouth every 6 (six) hours as needed for fever or headache (or pain).     ALPRAZolam (XANAX) 0.5 MG tablet Take 1 tablet (0.5 mg total) by mouth 2 (two) times daily as needed for anxiety. 20 tablet 0   baclofen (LIORESAL) 20 MG tablet Take 1 tablet (20 mg total) by mouth 3 (three) times daily as needed for muscle spasms. (Patient taking differently: Take 20 mg by mouth See admin instructions. Once to twice a day for breakthrough spasms) 180 each 3   buPROPion (WELLBUTRIN XL) 300 MG 24 hr tablet Take 300 mg by mouth daily.     CALCIUM CITRATE PO Take 2 tablets by mouth 2 (two) times daily.     cetirizine (ZYRTEC) 10 MG tablet Take 10 mg by mouth daily.     Cholecalciferol (VITAMIN D) 2000 units tablet Take 1 tablet  (2,000 Units total) by mouth 2 (two) times daily.  0   co-enzyme Q-10 30 MG capsule Take 30 mg by mouth daily.     corticotropin (ATHACAR H.P.) 80 UNIT/ML injectable gel Inject 80 units subcutaneously once every other day  for a total of 10 days , repeat  every 30 days. 5 mL 5   diltiazem (CARDIZEM CD) 120 MG 24 hr capsule TAKE 1 CAPSULE BY MOUTH  DAILY 90 capsule 0   docusate sodium (COLACE) 100 MG capsule Take 1 capsule (100 mg total) by mouth daily. 10 capsule 0   fluticasone (FLONASE) 50 MCG/ACT nasal spray Place 2 sprays into both nostrils daily as needed for allergies.      furosemide (LASIX) 40 MG tablet Take 2 tablets (80 mg total) by mouth 2 (two) times daily. 180 tablet 3   gabapentin (NEURONTIN) 300 MG capsule TAKE 1 CAPSULE BY MOUTH UP  TO 4 TIMES DAILY (Patient taking differently: TAKE 1 CAPSULE BY MOUTH UP  TO 3 times daily) 360 capsule 3   GENERLAC 10 GM/15ML SOLN TAKE 7.5 ML BY MOUTH TWICE DAILY 900 mL 5   Interferon Beta-1b (BETASERON/EXTAVIA) 0.3 MG KIT injection Every other day , subcutaneus. Please titrate pt based on normal titration schedule. 1 each 12   levothyroxine (SYNTHROID, LEVOTHROID) 75 MCG tablet Take 75 mcg by mouth daily.     Multiple Vitamins-Minerals (CENTRUM SILVER  Take 1 tablet by mouth daily.    °• Nebivolol HCl (BYSTOLIC) 20 MG TABS Take 20 mg by mouth daily.    °• Omega-3 Fatty Acids (FISH OIL) 1000 MG CAPS Take 1,000 mg by mouth daily.    °• omeprazole (PRILOSEC) 20 MG capsule Take 20 mg by mouth daily.     °• psyllium (METAMUCIL SMOOTH TEXTURE) 28 % packet Take 1 packet by mouth every other day.    °• ranitidine (ZANTAC) 150 MG tablet Take 150 mg by mouth at bedtime.     °• simvastatin (ZOCOR) 20 MG tablet Take 10 mg by mouth at bedtime.     °• SYRINGE/NEEDLE, DISP, 1 ML (BD LUER-LOK SYRINGE) 25G X 5/8" 1 ML MISC Use as directed to inject  Acthar 10 each prn  °• Tamsulosin HCl (FLOMAX) 0.4 MG CAPS Take 0.4 mg by mouth daily.    °• UNABLE TO FIND  Baclofen pump: Continuous    °• warfarin (COUMADIN) 5 MG tablet TAKE 1/2 TO 1 TABLET BY  MOUTH DAILY AS DIRECTED BY  COUMADIN CLINIC. 90 tablet 0  ° °Current Facility-Administered Medications on File Prior to Visit  °Medication Dose Route Frequency Provider Last Rate Last Dose  °• baclofen (LIORESAL) intrathecal injection 40 mg/20mL  40 mg Intrathecal Once Willis, Charles K, MD      ° ° °PAST MEDICAL HISTORY: °Past Medical History:  °Diagnosis Date  °• Abnormality of gait 03/01/2015  °• Anxiety   °• Arthritis   ° "knees" (01/19/2016)  °• Atrial fibrillation with RVR (HCC) 01/19/2016  °• Back pain   °• Chest pain   °• Childhood asthma   °• Chronic pain   ° "nerve pain from the MS"  °• Constipation   °• Depression   °• DVT (deep venous thrombosis) (HCC) 1983  ° "RLE; may have just been phlebitis; it was before the age of dopplers"  °• Edema   ° 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' ablation of R GSV 2012  °• GERD (gastroesophageal reflux disease)   °• History of stress test 06/21/10  ° limited exam with some degree of breast attenuatin of the apex however a component of apical ischemia is not excluded, EF 62%; cardiac cath   °• HLD (hyperlipidemia)   °• Hx of echocardiogram 04/22/10  ° EF 55-60%, no valve issues  °• Hypertension   °• Hypothyroidism   °• Joint pain   °• Migraine   ° "none since I stopped beta blocker in 1990s" (01/19/2016)  °• Multiple sclerosis (HCC)   ° has had this may 1989-[facility] reg doc  °• OSA on CPAP   °• Osteoarthritis   °• PAF (paroxysmal atrial fibrillation) (HCC)   ° on coumadin; documented on monitor 06/2010  °• Pneumonia 11/2011  °• Shortness of breath   °• Thyroid disease   ° ° °PAST SURGICAL HISTORY: °Past Surgical History:  °Procedure Laterality Date  °• ANTERIOR CERVICAL DECOMP/DISCECTOMY FUSION  01/2004  °• BACK SURGERY    °• CARDIAC CATHETERIZATION  06/22/2010  ° normal coronary arteries, PAF  °• CATARACT EXTRACTION, BILATERAL Bilateral   °• CHILD BIRTHS    ° X2  °•  DILATION AND CURETTAGE OF UTERUS    °• INFUSION PUMP IMPLANTATION  ~ 2009  ° baclofen infusion in lower abd  °• PAIN PUMP REVISION N/A 07/29/2014  ° Procedure: Baclofen pump replacement;  Surgeon: Joseph Stern, MD;  Location: [facility];  Service: Neurosurgery;  Laterality: N/A;    Baclofen pump replacement  °• TONSILLECTOMY AND ADENOIDECTOMY    °• VARICOSE VEIN SURGERY  ~ 1968  °• VENOUS ABLATION  12/16/2010  ° radiofreq ablation -Dr Solomon and Hilty  °• WISDOM TOOTH EXTRACTION    ° ° °SOCIAL HISTORY: °Social History  °Substance Use Topics  °• Smoking status: Former Smoker  °  Packs/day: 0.50  °  Years: 7.00  °  Types: Cigarettes  °  Quit date: 08/21/1976  °• Smokeless tobacco: Never Used  °• Alcohol use 0.6 oz/week  °  1 Glasses of wine per week  ° ° °FAMILY HISTORY: °Family History  °Problem Relation Age of Onset  °• Coronary artery disease Father   °     at age 82  °• Hyperlipidemia Father   °• Thyroid disease Father   °• Coronary artery disease Maternal Grandmother   °• Depression Maternal Grandmother   °• Cancer Paternal Grandmother   °• Depression Mother   °• Sudden death Mother   °• Anxiety disorder Mother   °• Alcoholism Son   ° ° °ROS: °Review of Systems  °Constitutional: Negative for weight loss.  ° ° °PHYSICAL EXAM: °Blood pressure 120/82, pulse 74, temperature 98.7 °F (37.1 °C), height 5' 3" (1.6 m), SpO2 96 %. °There is no height or weight on file to calculate BMI. °Physical Exam  °Constitutional: She is oriented to person, place, and time. She appears well-developed and well-nourished.  °Cardiovascular: Normal rate.   °Pulmonary/Chest: Effort normal.  °Musculoskeletal: Normal range of motion.  °Neurological: She is oriented to person, place, and time.  °Skin: Skin is warm and dry.  °Psychiatric: She has a normal mood and affect. Her behavior is normal.  °Vitals reviewed. ° ° °RECENT LABS AND TESTS: °BMET °   °Component Value Date/Time  ° NA 141 03/08/2017 1152  ° K 4.6 03/08/2017 1152  ° CL 95 (L)  03/08/2017 1152  ° CO2 30 (H) 03/08/2017 1152  ° GLUCOSE 91 03/08/2017 1152  ° GLUCOSE 155 (H) 12/27/2016 0410  ° BUN 22 03/08/2017 1152  ° CREATININE 0.85 03/08/2017 1152  ° CALCIUM 9.3 03/08/2017 1152  ° GFRNONAA 70 03/08/2017 1152  ° GFRAA 81 03/08/2017 1152  ° °Lab Results  °Component Value Date  ° HGBA1C 5.4 03/08/2017  ° HGBA1C  04/21/2010  °  5.5 °(NOTE)                                                                       According to the ADA Clinical Practice Recommendations for 2011, when HbA1c is used as a screening test:   >=6.5%   Diagnostic of Diabetes Mellitus           (if abnormal result ° is confirmed)  5.7-6.4%   Increased risk of developing Diabetes Mellitus  References:Diagnosis and Classification of Diabetes Mellitus,Diabetes Care,2011,34(Suppl 1):S62-S69 and Standards of Medical Care in         Diabetes - 2011,Diabetes Care,2011,34  °(Suppl 1):S11-S61.  ° °Lab Results  °Component Value Date  ° INSULIN 20.4 03/08/2017  ° °CBC °   °Component Value Date/Time  ° WBC 6.4 03/08/2017 1152  ° WBC 8.7 12/27/2016 0410  ° RBC 4.64 03/08/2017 1152  ° RBC 4.31 12/27/2016 0410  ° HGB 14.1   03/08/2017 1152  ° HCT 43.0 03/08/2017 1152  ° PLT 174 12/27/2016 0410  ° PLT 219 12/18/2014 1157  ° MCV 93 03/08/2017 1152  ° MCH 30.4 03/08/2017 1152  ° MCH 29.5 12/27/2016 0410  ° MCHC 32.8 03/08/2017 1152  ° MCHC 33.0 12/27/2016 0410  ° RDW 13.3 03/08/2017 1152  ° LYMPHSABS 2.2 03/08/2017 1152  ° MONOABS 0.4 12/24/2016 1442  ° EOSABS 0.1 03/08/2017 1152  ° BASOSABS 0.0 03/08/2017 1152  ° °Iron/TIBC/Ferritin/ %Sat °No results found for: IRON, TIBC, FERRITIN, IRONPCTSAT °Lipid Panel  °   °Component Value Date/Time  ° CHOL 143 03/08/2017 1152  ° TRIG 89 03/08/2017 1152  ° HDL 57 03/08/2017 1152  ° CHOLHDL 3.0 04/22/2010 0536  ° VLDL 29 04/22/2010 0536  ° LDLCALC 68 03/08/2017 1152  ° °Hepatic Function Panel  °   °Component Value Date/Time  ° PROT 5.9 (L) 03/08/2017 1152  ° ALBUMIN 4.0 03/08/2017 1152  ° AST 21 03/08/2017  1152  ° ALT 23 03/08/2017 1152  ° ALKPHOS 61 03/08/2017 1152  ° BILITOT 0.3 03/08/2017 1152  ° °   °Component Value Date/Time  ° TSH 2.410 03/08/2017 1152  ° TSH 1.083 08/06/2016 0647  ° TSH 1.871 01/19/2016 2302  ° TSH 0.206 (L) 03/10/2011 1351  ° TSH 1.636 06/18/2010 1430  ° ° °ASSESSMENT AND PLAN: °Acute on chronic combined systolic and diastolic congestive heart failure (HCC) - Plan: B Nat Peptide, Comprehensive metabolic panel ° °Class 3 obesity with serious comorbidity and body mass index (BMI) of 40.0 to 44.9 in adult, unspecified obesity type (HCC) ° °PLAN: ° °Congestive Heart Failure °We will check CMP and BNP labs and Toni [name] is to bring her results to her Cardiologist. She was advised to take Lasix as prescribed by Dr. Hilty and continue to work on diet, exercise and decreasing sodium. She agrees to follow up with our clinic in 3 weeks. ° °Obesity °Toni [name] is currently in the action stage of change. °As such, her goal is to continue with weight loss efforts °She has agreed to follow the Category 2 plan +options °Toni [name] has been instructed to work up to a goal of 150 minutes of combined cardio and strengthening exercise per week for weight loss and overall health benefits. °We discussed the following Behavioral Modification Strategies today: meal planning & cooking strategies, no skipping meals, increasing lean protein intake and decreasing sodium intake ° °Toni [name] has agreed to follow up with our clinic in 3 weeks. She was informed of the importance of frequent follow up visits to maximize her success with intensive lifestyle modifications for her multiple health conditions. ° °I, Toni [name], am acting as transcriptionist for Toni Beasley, MD ° °I have reviewed the above documentation for accuracy and completeness, and I agree with the above. -Toni Beasley, MD ° °OBESITY BEHAVIORAL INTERVENTION VISIT ° °Today's visit was # 3 out of 22. ° °Starting weight: 257 lbs °Starting date:  03/08/17 °Today's weight : Toni [name] was unable to stand to weigh today °Today's date: 04/05/2017 °Total lbs lost to date: 4 °(Patients must lose 7 lbs in the first 6 months to continue with counseling) ° ° °ASK: °We discussed the diagnosis of obesity with Toni [name] today and Toni [name] agreed to give us permission to discuss obesity behavioral modification therapy today. ° °ASSESS: °Toni [name] has the diagnosis of obesity and we were unable to calculate her BMI due to her inability to stand to weigh today °Toni [name] is in the action stage of change  ° °  ADVISE: Toni [name] was educated on the multiple health risks of obesity as well as the benefit of weight loss to improve her health. She was advised of the need for long term treatment and the importance of lifestyle modifications.  AGREE: Multiple dietary modification options and treatment options were discussed and  Toni [name] agreed to follow the Category 2 plan +options We discussed the following Behavioral Modification Strategies today: meal planning & cooking strategies, no skipping meals, increasing lean protein intake and decreasing sodium intake

## 2017-04-06 DIAGNOSIS — I480 Paroxysmal atrial fibrillation: Secondary | ICD-10-CM | POA: Diagnosis not present

## 2017-04-06 DIAGNOSIS — M48062 Spinal stenosis, lumbar region with neurogenic claudication: Secondary | ICD-10-CM | POA: Diagnosis not present

## 2017-04-06 DIAGNOSIS — I509 Heart failure, unspecified: Secondary | ICD-10-CM | POA: Diagnosis not present

## 2017-04-06 DIAGNOSIS — I110 Hypertensive heart disease with heart failure: Secondary | ICD-10-CM | POA: Diagnosis not present

## 2017-04-06 DIAGNOSIS — G35 Multiple sclerosis: Secondary | ICD-10-CM | POA: Diagnosis not present

## 2017-04-06 DIAGNOSIS — E119 Type 2 diabetes mellitus without complications: Secondary | ICD-10-CM | POA: Diagnosis not present

## 2017-04-06 LAB — COMPREHENSIVE METABOLIC PANEL
ALBUMIN: 3.9 g/dL (ref 3.6–4.8)
ALT: 18 IU/L (ref 0–32)
AST: 17 IU/L (ref 0–40)
Albumin/Globulin Ratio: 1.9 (ref 1.2–2.2)
Alkaline Phosphatase: 56 IU/L (ref 39–117)
BUN / CREAT RATIO: 37 — AB (ref 12–28)
BUN: 27 mg/dL (ref 8–27)
Bilirubin Total: 0.2 mg/dL (ref 0.0–1.2)
CO2: 25 mmol/L (ref 20–29)
CREATININE: 0.73 mg/dL (ref 0.57–1.00)
Calcium: 9.5 mg/dL (ref 8.7–10.3)
Chloride: 97 mmol/L (ref 96–106)
GFR calc Af Amer: 97 mL/min/{1.73_m2} (ref >59)
GFR, EST NON AFRICAN AMERICAN: 84 mL/min/{1.73_m2} (ref >59)
GLUCOSE: 113 mg/dL — AB (ref 65–99)
Globulin, Total: 2.1 g/dL (ref 1.5–4.5)
Potassium: 4.2 mmol/L (ref 3.5–5.2)
Sodium: 138 mmol/L (ref 134–144)
TOTAL PROTEIN: 6 g/dL (ref 6.0–8.5)

## 2017-04-06 LAB — BRAIN NATRIURETIC PEPTIDE: BNP: 109.8 pg/mL — ABNORMAL HIGH (ref 0.0–100.0)

## 2017-04-08 ENCOUNTER — Other Ambulatory Visit: Payer: Self-pay | Admitting: Internal Medicine

## 2017-04-09 ENCOUNTER — Telehealth: Payer: Self-pay | Admitting: Neurology

## 2017-04-09 DIAGNOSIS — G35 Multiple sclerosis: Secondary | ICD-10-CM | POA: Diagnosis not present

## 2017-04-09 DIAGNOSIS — E119 Type 2 diabetes mellitus without complications: Secondary | ICD-10-CM | POA: Diagnosis not present

## 2017-04-09 DIAGNOSIS — M48062 Spinal stenosis, lumbar region with neurogenic claudication: Secondary | ICD-10-CM | POA: Diagnosis not present

## 2017-04-09 DIAGNOSIS — I110 Hypertensive heart disease with heart failure: Secondary | ICD-10-CM | POA: Diagnosis not present

## 2017-04-09 DIAGNOSIS — I480 Paroxysmal atrial fibrillation: Secondary | ICD-10-CM | POA: Diagnosis not present

## 2017-04-09 DIAGNOSIS — I509 Heart failure, unspecified: Secondary | ICD-10-CM | POA: Diagnosis not present

## 2017-04-09 NOTE — Telephone Encounter (Signed)
Rx(s) sent to pharmacy electronically.  

## 2017-04-09 NOTE — Telephone Encounter (Signed)
Pt calling re: a spot on a leg for over 6 weeks where she got her Interferon Beta-1b (BETASERON/EXTAVIA) 0.3 MG KIT injection, pt has been using ointment and noticed only very slight lightning.  Pt states there is now a spot showing on other thigh and she is questioning if she should consider still taking the injection.  Pt would like a call back please

## 2017-04-11 NOTE — Telephone Encounter (Signed)
Called betaseron pt support, spoke with Wells Guiles who stated she would put in request for a  field RN to visit patient and be certain she was using the correct injection technique. The RN can retrain the patient if needed. Wells Guiles stated the RN will not assess the injection sites. She stated that if it is determined the patient is having an adverse reaction to the medication, she will tell the patient to call her provider for further instructions.

## 2017-04-11 NOTE — Telephone Encounter (Signed)
Pt states that she showed Dr Brett Fairy in the office and has been having this spot for 2 mths that is red not warm to touch. She applied hydrcortisone cream per Dr Dohmeier's recommendation but doesn't feel that its making it any better. Pt states that the other thigh has a spot forming and they are all in the location of where her medication has been given. Pt is wanting to know if she should continue the medication.

## 2017-04-11 NOTE — Telephone Encounter (Signed)
Can we get a betaseron/ extavia nurse out to the patient? Please arrange for home visit. CD

## 2017-04-12 ENCOUNTER — Ambulatory Visit (INDEPENDENT_AMBULATORY_CARE_PROVIDER_SITE_OTHER): Payer: Medicare Other | Admitting: Pharmacist Clinician (PhC)/ Clinical Pharmacy Specialist

## 2017-04-12 DIAGNOSIS — I480 Paroxysmal atrial fibrillation: Secondary | ICD-10-CM | POA: Diagnosis not present

## 2017-04-12 DIAGNOSIS — I4821 Permanent atrial fibrillation: Secondary | ICD-10-CM

## 2017-04-12 DIAGNOSIS — I110 Hypertensive heart disease with heart failure: Secondary | ICD-10-CM | POA: Diagnosis not present

## 2017-04-12 DIAGNOSIS — M48062 Spinal stenosis, lumbar region with neurogenic claudication: Secondary | ICD-10-CM | POA: Diagnosis not present

## 2017-04-12 DIAGNOSIS — G35 Multiple sclerosis: Secondary | ICD-10-CM | POA: Diagnosis not present

## 2017-04-12 DIAGNOSIS — Z7901 Long term (current) use of anticoagulants: Secondary | ICD-10-CM

## 2017-04-12 DIAGNOSIS — I509 Heart failure, unspecified: Secondary | ICD-10-CM | POA: Diagnosis not present

## 2017-04-12 DIAGNOSIS — I482 Chronic atrial fibrillation: Secondary | ICD-10-CM

## 2017-04-12 DIAGNOSIS — E119 Type 2 diabetes mellitus without complications: Secondary | ICD-10-CM | POA: Diagnosis not present

## 2017-04-12 LAB — POCT INR: INR: 2.9

## 2017-04-16 DIAGNOSIS — E119 Type 2 diabetes mellitus without complications: Secondary | ICD-10-CM | POA: Diagnosis not present

## 2017-04-16 DIAGNOSIS — I480 Paroxysmal atrial fibrillation: Secondary | ICD-10-CM | POA: Diagnosis not present

## 2017-04-16 DIAGNOSIS — I509 Heart failure, unspecified: Secondary | ICD-10-CM | POA: Diagnosis not present

## 2017-04-16 DIAGNOSIS — I110 Hypertensive heart disease with heart failure: Secondary | ICD-10-CM | POA: Diagnosis not present

## 2017-04-16 DIAGNOSIS — G35 Multiple sclerosis: Secondary | ICD-10-CM | POA: Diagnosis not present

## 2017-04-16 DIAGNOSIS — M48062 Spinal stenosis, lumbar region with neurogenic claudication: Secondary | ICD-10-CM | POA: Diagnosis not present

## 2017-04-19 ENCOUNTER — Ambulatory Visit (INDEPENDENT_AMBULATORY_CARE_PROVIDER_SITE_OTHER): Payer: Medicare Other | Admitting: Pharmacist Clinician (PhC)/ Clinical Pharmacy Specialist

## 2017-04-19 ENCOUNTER — Telehealth: Payer: Self-pay | Admitting: Pharmacist Clinician (PhC)/ Clinical Pharmacy Specialist

## 2017-04-19 DIAGNOSIS — G35 Multiple sclerosis: Secondary | ICD-10-CM | POA: Diagnosis not present

## 2017-04-19 DIAGNOSIS — I509 Heart failure, unspecified: Secondary | ICD-10-CM | POA: Diagnosis not present

## 2017-04-19 DIAGNOSIS — M48062 Spinal stenosis, lumbar region with neurogenic claudication: Secondary | ICD-10-CM | POA: Diagnosis not present

## 2017-04-19 DIAGNOSIS — I4821 Permanent atrial fibrillation: Secondary | ICD-10-CM

## 2017-04-19 DIAGNOSIS — I110 Hypertensive heart disease with heart failure: Secondary | ICD-10-CM | POA: Diagnosis not present

## 2017-04-19 DIAGNOSIS — I480 Paroxysmal atrial fibrillation: Secondary | ICD-10-CM | POA: Diagnosis not present

## 2017-04-19 DIAGNOSIS — Z7901 Long term (current) use of anticoagulants: Secondary | ICD-10-CM

## 2017-04-19 DIAGNOSIS — E119 Type 2 diabetes mellitus without complications: Secondary | ICD-10-CM | POA: Diagnosis not present

## 2017-04-19 DIAGNOSIS — I482 Chronic atrial fibrillation: Secondary | ICD-10-CM

## 2017-04-19 LAB — POCT INR: INR: 2.4

## 2017-04-19 NOTE — Telephone Encounter (Signed)
Coumadin letter 

## 2017-04-23 DIAGNOSIS — G35 Multiple sclerosis: Secondary | ICD-10-CM | POA: Diagnosis not present

## 2017-04-23 DIAGNOSIS — I509 Heart failure, unspecified: Secondary | ICD-10-CM | POA: Diagnosis not present

## 2017-04-23 DIAGNOSIS — E119 Type 2 diabetes mellitus without complications: Secondary | ICD-10-CM | POA: Diagnosis not present

## 2017-04-23 DIAGNOSIS — I110 Hypertensive heart disease with heart failure: Secondary | ICD-10-CM | POA: Diagnosis not present

## 2017-04-23 DIAGNOSIS — I480 Paroxysmal atrial fibrillation: Secondary | ICD-10-CM | POA: Diagnosis not present

## 2017-04-23 DIAGNOSIS — M48062 Spinal stenosis, lumbar region with neurogenic claudication: Secondary | ICD-10-CM | POA: Diagnosis not present

## 2017-04-26 ENCOUNTER — Ambulatory Visit (INDEPENDENT_AMBULATORY_CARE_PROVIDER_SITE_OTHER): Payer: Medicare Other | Admitting: Family Medicine

## 2017-04-26 VITALS — BP 119/68 | HR 68 | Temp 98.1°F | Ht 63.0 in | Wt 242.0 lb

## 2017-04-26 DIAGNOSIS — M48062 Spinal stenosis, lumbar region with neurogenic claudication: Secondary | ICD-10-CM | POA: Diagnosis not present

## 2017-04-26 DIAGNOSIS — I480 Paroxysmal atrial fibrillation: Secondary | ICD-10-CM | POA: Diagnosis not present

## 2017-04-26 DIAGNOSIS — Z6841 Body Mass Index (BMI) 40.0 and over, adult: Secondary | ICD-10-CM

## 2017-04-26 DIAGNOSIS — G35 Multiple sclerosis: Secondary | ICD-10-CM | POA: Diagnosis not present

## 2017-04-26 DIAGNOSIS — I509 Heart failure, unspecified: Secondary | ICD-10-CM | POA: Diagnosis not present

## 2017-04-26 DIAGNOSIS — E669 Obesity, unspecified: Secondary | ICD-10-CM | POA: Diagnosis not present

## 2017-04-26 DIAGNOSIS — K5909 Other constipation: Secondary | ICD-10-CM

## 2017-04-26 DIAGNOSIS — I110 Hypertensive heart disease with heart failure: Secondary | ICD-10-CM | POA: Diagnosis not present

## 2017-04-26 DIAGNOSIS — E119 Type 2 diabetes mellitus without complications: Secondary | ICD-10-CM | POA: Diagnosis not present

## 2017-04-26 DIAGNOSIS — IMO0001 Reserved for inherently not codable concepts without codable children: Secondary | ICD-10-CM

## 2017-04-26 NOTE — Progress Notes (Signed)
° °Office: [phone]  /  Fax: [phone] ° ° °HPI:  ° °Chief Complaint: OBESITY °Toni [name] is here to discuss her progress with her obesity treatment plan. She is on the  follow the Category 2 plan and is following her eating plan approximately 100 % of the time. She states she is exercising 0 minutes 0 times per week. °Toni [name] continues to do well with weight loss. She is being creative with her food choices. Toni [name] is working on increasing her vegetables and lean protein and is working on decreasing simple carbohydrates and limiting snacks. She is getting food support from her husband. °Her weight is 242 lb (109.8 kg) today. She has lost 15 lbs since starting treatment with us. ° °Constipation °Toni [name]  notes decreased BM frequency in the last month, worse since attempting weight loss and are not hard or painful. She feels BM are darker but not black and she denies rectal bleeding. Toni [name] is on multiple medications to keep her BM soft due to her MS. She denies hematochezia or melena and she admits to drinking less [name] recently. Her last colonoscopy was approximately 10 yrs ago. ° ° °ALLERGIES: °Allergies  °Allergen Reactions  °• Hydrocodone Nausea And Vomiting  °  Hydrocodone causes vomiting  °• Oxycodone Nausea And Vomiting  °  Oral oxycodone causes vomiting  °• Chlorhexidine Gluconate Rash  °  Sores at site  °• Zoloft [Sertraline] Hives, Swelling and Rash  ° ° °MEDICATIONS: °Current Outpatient Prescriptions on File Prior to Visit  °Medication Sig Dispense Refill  °• acetaminophen (TYLENOL) 500 MG tablet Take 500-1,000 mg by mouth every 6 (six) hours as needed for fever or headache (or pain).    °• ALPRAZolam (XANAX) 0.5 MG tablet Take 1 tablet (0.5 mg total) by mouth 2 (two) times daily as needed for anxiety. 20 tablet 0  °• baclofen (LIORESAL) 20 MG tablet Take 1 tablet (20 mg total) by mouth 3 (three) times daily as needed for muscle spasms. (Patient taking differently: Take 20 mg by mouth See  admin instructions. Once to twice a day for breakthrough spasms) 180 each 3  °• buPROPion (WELLBUTRIN XL) 300 MG 24 hr tablet Take 300 mg by mouth daily.    °• CALCIUM CITRATE PO Take 2 tablets by mouth 2 (two) times daily.    °• cetirizine (ZYRTEC) 10 MG tablet Take 10 mg by mouth daily.    °• Cholecalciferol (VITAMIN D) 2000 units tablet Take 1 tablet (2,000 Units total) by mouth 2 (two) times daily.  0  °• co-enzyme Q-10 30 MG capsule Take 30 mg by mouth daily.    °• corticotropin (ATHACAR H.P.) 80 UNIT/ML injectable gel Inject 80 units subcutaneously once every other day  for a total of 10 days , repeat  every 30 days. 5 mL 5  °• diltiazem (CARDIZEM CD) 120 MG 24 hr capsule TAKE 1 CAPSULE BY MOUTH  DAILY 90 capsule 2  °• docusate sodium (COLACE) 100 MG capsule Take 1 capsule (100 mg total) by mouth daily. 10 capsule 0  °• fluticasone (FLONASE) 50 MCG/ACT nasal spray Place 2 sprays into both nostrils daily as needed for allergies.     °• furosemide (LASIX) 40 MG tablet Take 2 tablets (80 mg total) by mouth 2 (two) times daily. 180 tablet 3  °• gabapentin (NEURONTIN) 300 MG capsule TAKE 1 CAPSULE BY MOUTH UP  TO 4 TIMES DAILY (Patient taking differently: TAKE 1 CAPSULE BY MOUTH UP  TO 3 times daily) 360 capsule 3  °•   GENERLAC 10 GM/15ML SOLN TAKE 7.5 ML BY MOUTH TWICE DAILY 900 mL 5   Interferon Beta-1b (BETASERON/EXTAVIA) 0.3 MG KIT injection Every other day , subcutaneus. Please titrate pt based on normal titration schedule. 1 each 12   levothyroxine (SYNTHROID, LEVOTHROID) 75 MCG tablet Take 75 mcg by mouth daily.     Multiple Vitamins-Minerals (CENTRUM SILVER PO) Take 1 tablet by mouth daily.     Nebivolol HCl (BYSTOLIC) 20 MG TABS Take 20 mg by mouth daily.     Omega-3 Fatty Acids (FISH OIL) 1000 MG CAPS Take 1,000 mg by mouth daily.     omeprazole (PRILOSEC) 20 MG capsule Take 20 mg by mouth daily.      psyllium (METAMUCIL SMOOTH TEXTURE) 28 % packet Take 1 packet by mouth every other day.       ranitidine (ZANTAC) 150 MG tablet Take 150 mg by mouth at bedtime.      simvastatin (ZOCOR) 20 MG tablet Take 10 mg by mouth at bedtime.      SYRINGE/NEEDLE, DISP, 1 ML (BD LUER-LOK SYRINGE) 25G X 5/8" 1 ML MISC Use as directed to inject  Acthar 10 each prn   Tamsulosin HCl (FLOMAX) 0.4 MG CAPS Take 0.4 mg by mouth daily.     UNABLE TO FIND Baclofen pump: Continuous     warfarin (COUMADIN) 5 MG tablet TAKE 1/2 TO 1 TABLET BY  MOUTH DAILY AS DIRECTED BY  C[facility]MADIN CLINIC. 90 tablet 0   Current Facility-Administered Medications on File Prior to Visit  Medication Dose Route Frequency Provider Last Rate Last Dose   baclofen (LIORESAL) intrathecal injection 40 mg/40m  40 mg Intrathecal Once WKathrynn Ducking MD        PAST MEDICAL HISTORY: Past Medical History:  Diagnosis Date   Abnormality of gait 03/01/2015   Anxiety    Arthritis    "knees" (01/19/2016)   Atrial fibrillation with RVR ([facility] 01/19/2016   Back pain    Chest pain    Childhood asthma    Chronic pain    "nerve pain from the MS"   Constipation    Depression    DV[place]) [place] [place]   "RLE; may have just been phlebitis; it was before the age of dopplers"   Edema    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' ablation of R GSV 2012   GERD (gastroesophageal reflux disease)    History of stress test 06/21/10   limited exam with some degree of breast attenuatin of the apex however a component of apical ischemia is not excluded, EF 62%; cardiac cath    HLD (hyperlipidemia)    Hx of echocardiogram 04/22/10   EF 55-60%, no valve issues   Hypertension    Hypothyroidism    Joint pain    Migraine    "none since I stopped beta blocker in 1990s" (01/19/2016)   Multiple sclerosis ([facility]    has had this may 1989-[facility] doc   OSA on CPAP    Osteoarthritis    PAF ([place]oxysmal [place] fibrillation) ([facility]    on coumadin; documented on monitor 06/2010   Pneumonia 11/2011    Shortness of breath    Thyroid disease     PAST SURGICAL HISTORY: Past Surgical History:  Procedure Laterality Date   ANTERIOR CERVICAL DECOMP/DISCECTOMY FUSION  01/2004   BACK SURGERY     CARDIAC CATHETERIZATION  06/22/2010   normal coronary arteries, PAF   CATARACT EXTRACTION, BILATERAL Bilateral  CHILD BIRTHS    ° X2  °• DILATION AND CURETTAGE OF UTERUS    °• INFUSION PUMP IMPLANTATION  ~ 2009  ° baclofen infusion in lower abd  °• PAIN PUMP REVISION N/A 07/29/2014  ° Procedure: Baclofen pump replacement;  Surgeon: Joseph Stern, MD;  Location: [facility];  Service: Neurosurgery;  Laterality: N/A;  Baclofen pump replacement  °• TONSILLECTOMY AND ADENOIDECTOMY    °• VARICOSE VEIN SURGERY  ~ 1968  °• VENOUS ABLATION  12/16/2010  ° radiofreq ablation -Dr Solomon and Hilty  °• WISDOM TOOTH EXTRACTION    ° ° °SOCIAL HISTORY: °Social History  °Substance Use Topics  °• Smoking status: Former Smoker  °  Packs/day: 0.50  °  Years: 7.00  °  Types: Cigarettes  °  Quit date: 08/21/1976  °• Smokeless tobacco: Never Used  °• Alcohol use 0.6 oz/week  °  1 Glasses of wine per week  ° ° °FAMILY HISTORY: °Family History  °Problem Relation Age of Onset  °• Coronary artery disease Father   °     at age 82  °• Hyperlipidemia Father   °• Thyroid disease Father   °• Coronary artery disease Maternal Grandmother   °• Depression Maternal Grandmother   °• Cancer Paternal Grandmother   °• Depression Mother   °• Sudden death Mother   °• Anxiety disorder Mother   °• Alcoholism Son   ° ° °ROS: °Review of Systems  °Constitutional: Positive for weight loss.  °Gastrointestinal: Positive for constipation. Negative for blood in stool and melena.  ° ° °PHYSICAL EXAM: °Blood pressure 119/68, pulse 68, temperature 98.1 °F (36.7 °C), temperature source Oral, height 5' 3" (1.6 m), weight 242 lb (109.8 kg), SpO2 97 %. °Body mass index is 42.87 kg/m². °Physical Exam  °Constitutional: She is oriented to person, place, and time. She  appears well-developed and well-nourished.  °Cardiovascular: Normal rate.   °Pulmonary/Chest: Effort normal.  °Musculoskeletal: Normal range of motion.  °Neurological: She is oriented to person, place, and time.  °Skin: Skin is warm and dry.  °Psychiatric: She has a normal mood and affect. Her behavior is normal.  °Vitals reviewed. ° ° °RECENT LABS AND TESTS: °BMET °   °Component Value Date/Time  ° NA 138 04/05/2017 1450  ° K 4.2 04/05/2017 1450  ° CL 97 04/05/2017 1450  ° CO2 25 04/05/2017 1450  ° GLUCOSE 113 (H) 04/05/2017 1450  ° GLUCOSE 155 (H) 12/27/2016 0410  ° BUN 27 04/05/2017 1450  ° CREATININE 0.73 04/05/2017 1450  ° CALCIUM 9.5 04/05/2017 1450  ° GFRNONAA 84 04/05/2017 1450  ° GFRAA 97 04/05/2017 1450  ° °Lab Results  °Component Value Date  ° HGBA1C 5.4 03/08/2017  ° HGBA1C  04/21/2010  °  5.5 °(NOTE)                                                                       According to the ADA Clinical Practice Recommendations for 2011, when HbA1c is used as a screening test:   >=6.5%   Diagnostic of Diabetes Mellitus           (if abnormal result ° is confirmed)  5.7-6.4%   Increased risk of developing Diabetes Mellitus  References:Diagnosis and Classification of   Diabetes Mellitus,Diabetes Care,2011,34(Suppl 1):S62-S69 and Standards of Medical Care in         Diabetes - 2011,Diabetes Care,2011,34  °(Suppl 1):S11-S61.  ° °Lab Results  °Component Value Date  ° INSULIN 20.4 03/08/2017  ° °CBC °   °Component Value Date/Time  ° WBC 6.4 03/08/2017 1152  ° WBC 8.7 12/27/2016 0410  ° RBC 4.64 03/08/2017 1152  ° RBC 4.31 12/27/2016 0410  ° HGB 14.1 03/08/2017 1152  ° HCT 43.0 03/08/2017 1152  ° PLT 174 12/27/2016 0410  ° PLT 219 12/18/2014 1157  ° MCV 93 03/08/2017 1152  ° MCH 30.4 03/08/2017 1152  ° MCH 29.5 12/27/2016 0410  ° MCHC 32.8 03/08/2017 1152  ° MCHC 33.0 12/27/2016 0410  ° RDW 13.3 03/08/2017 1152  ° LYMPHSABS 2.2 03/08/2017 1152  ° MONOABS 0.4 12/24/2016 1442  ° EOSABS 0.1 03/08/2017 1152  ° BASOSABS  0.0 03/08/2017 1152  ° °Iron/TIBC/Ferritin/ %Sat °No results found for: IRON, TIBC, FERRITIN, IRONPCTSAT °Lipid Panel  °   °Component Value Date/Time  ° CHOL 143 03/08/2017 1152  ° TRIG 89 03/08/2017 1152  ° HDL 57 03/08/2017 1152  ° CHOLHDL 3.0 04/22/2010 0536  ° VLDL 29 04/22/2010 0536  ° LDLCALC 68 03/08/2017 1152  ° °Hepatic Function Panel  °   °Component Value Date/Time  ° PROT 6.0 04/05/2017 1450  ° ALBUMIN 3.9 04/05/2017 1450  ° AST 17 04/05/2017 1450  ° ALT 18 04/05/2017 1450  ° ALKPHOS 56 04/05/2017 1450  ° BILITOT 0.2 04/05/2017 1450  ° °   °Component Value Date/Time  ° TSH 2.410 03/08/2017 1152  ° TSH 1.083 08/06/2016 0647  ° TSH 1.871 01/19/2016 2302  ° TSH 0.206 (L) 03/10/2011 1351  ° TSH 1.636 06/18/2010 1430  ° ° °ASSESSMENT AND PLAN: °Other constipation - Plan: Ambulatory referral to Gastroenterology ° °Class 3 obesity with serious comorbidity and body mass index (BMI) of 40.0 to 44.9 in adult, unspecified obesity type (HCC) ° °PLAN: ° °Constipation °Lashala was informed decrease bowel movement frequency is normal while losing weight, but stools should not be hard or painful. She was advised to increase her [name] intake and work on increasing her fiber intake. High fiber foods were discussed today. We will refer to GI for consult, as her other health issues may make a colonoscopy difficult. ° °We spent > than 50% of the 15 minute visit on the counseling as documented in the note. ° °Obesity °Endia is currently in the action stage of change. °As such, her goal is to continue with weight loss efforts °She has agreed to follow the Category 2 plan °Jaton has been instructed to work up to a goal of 150 minutes of combined cardio and strengthening exercise per week for weight loss and overall health benefits. °We discussed the following Behavioral Modification Strategies today: increase H2O intake, keeping healthy foods in the home, increasing lean protein intake and decreasing simple carbohydrates   ° °Zoya has agreed to follow up with our clinic in 3 weeks. She was informed of the importance of frequent follow up visits to maximize her success with intensive lifestyle modifications for her multiple health conditions. ° °I, Joanne Murray, am acting as transcriptionist for Caren Beasley, MD ° °I have reviewed the above documentation for accuracy and completeness, and I agree with the above. -Caren Beasley, MD ° ° °OBESITY BEHAVIORAL INTERVENTION VISIT ° °Today's visit was # 4 out of 22. ° °Starting weight: 257 lbs °Starting date: 03/08/17 °Today's weight : 242 lbs °  Today's date: 04/26/2017 °Total lbs lost to date: 15 °(Patients must lose 7 lbs in the first 6 months to continue with counseling) ° ° °ASK: °We discussed the diagnosis of obesity with Toni [name] today and Toni [name] agreed to give us permission to discuss obesity behavioral modification therapy today. ° °ASSESS: °Toni [name] has the diagnosis of obesity and her BMI today is 42.88 °Toni [name] is in the action stage of change  ° °ADVISE: °Toni [name] was educated on the multiple health risks of obesity as well as the benefit of weight loss to improve her health. She was advised of the need for long term treatment and the importance of lifestyle modifications. ° °AGREE: °Multiple dietary modification options and treatment options were discussed and  Toni [name] agreed to follow the Category 2 plan °We discussed the following Behavioral Modification Strategies today: increase H2O intake, keeping healthy foods in the home, increasing lean protein intake and decreasing simple carbohydrates  ° ° ° ° °

## 2017-04-27 ENCOUNTER — Telehealth: Payer: Self-pay | Admitting: Internal Medicine

## 2017-04-27 NOTE — Telephone Encounter (Signed)
Faxed signed orders r/t PT[facility]R to [facility]

## 2017-04-30 ENCOUNTER — Encounter: Payer: Self-pay | Admitting: Nurse Practitioner

## 2017-04-30 DIAGNOSIS — R609 Edema, unspecified: Secondary | ICD-10-CM | POA: Diagnosis not present

## 2017-04-30 DIAGNOSIS — I10 Essential (primary) hypertension: Secondary | ICD-10-CM | POA: Diagnosis not present

## 2017-04-30 DIAGNOSIS — I480 Paroxysmal atrial fibrillation: Secondary | ICD-10-CM | POA: Diagnosis not present

## 2017-04-30 DIAGNOSIS — L309 Dermatitis, unspecified: Secondary | ICD-10-CM | POA: Diagnosis not present

## 2017-04-30 DIAGNOSIS — G35 Multiple sclerosis: Secondary | ICD-10-CM | POA: Diagnosis not present

## 2017-05-03 ENCOUNTER — Ambulatory Visit (INDEPENDENT_AMBULATORY_CARE_PROVIDER_SITE_OTHER): Payer: Medicare Other | Admitting: Pharmacist

## 2017-05-03 DIAGNOSIS — Z7901 Long term (current) use of anticoagulants: Secondary | ICD-10-CM

## 2017-05-03 DIAGNOSIS — I482 Chronic atrial fibrillation: Secondary | ICD-10-CM

## 2017-05-03 DIAGNOSIS — E119 Type 2 diabetes mellitus without complications: Secondary | ICD-10-CM | POA: Diagnosis not present

## 2017-05-03 DIAGNOSIS — M48062 Spinal stenosis, lumbar region with neurogenic claudication: Secondary | ICD-10-CM | POA: Diagnosis not present

## 2017-05-03 DIAGNOSIS — I480 Paroxysmal atrial fibrillation: Secondary | ICD-10-CM | POA: Diagnosis not present

## 2017-05-03 DIAGNOSIS — I4821 Permanent atrial fibrillation: Secondary | ICD-10-CM

## 2017-05-03 DIAGNOSIS — I509 Heart failure, unspecified: Secondary | ICD-10-CM | POA: Diagnosis not present

## 2017-05-03 DIAGNOSIS — G35 Multiple sclerosis: Secondary | ICD-10-CM | POA: Diagnosis not present

## 2017-05-03 DIAGNOSIS — I110 Hypertensive heart disease with heart failure: Secondary | ICD-10-CM | POA: Diagnosis not present

## 2017-05-03 LAB — POCT INR: INR: 2.3

## 2017-05-07 IMAGING — CR DG CHEST 2V
3 series · 3 of 3 positions shown · non-contrast
Comparison: 10/30/2013

CLINICAL DATA: Left chest pain and arm pain starting last night.

EXAM:
CHEST  2 VIEW

[chest lat (1 of 2)]
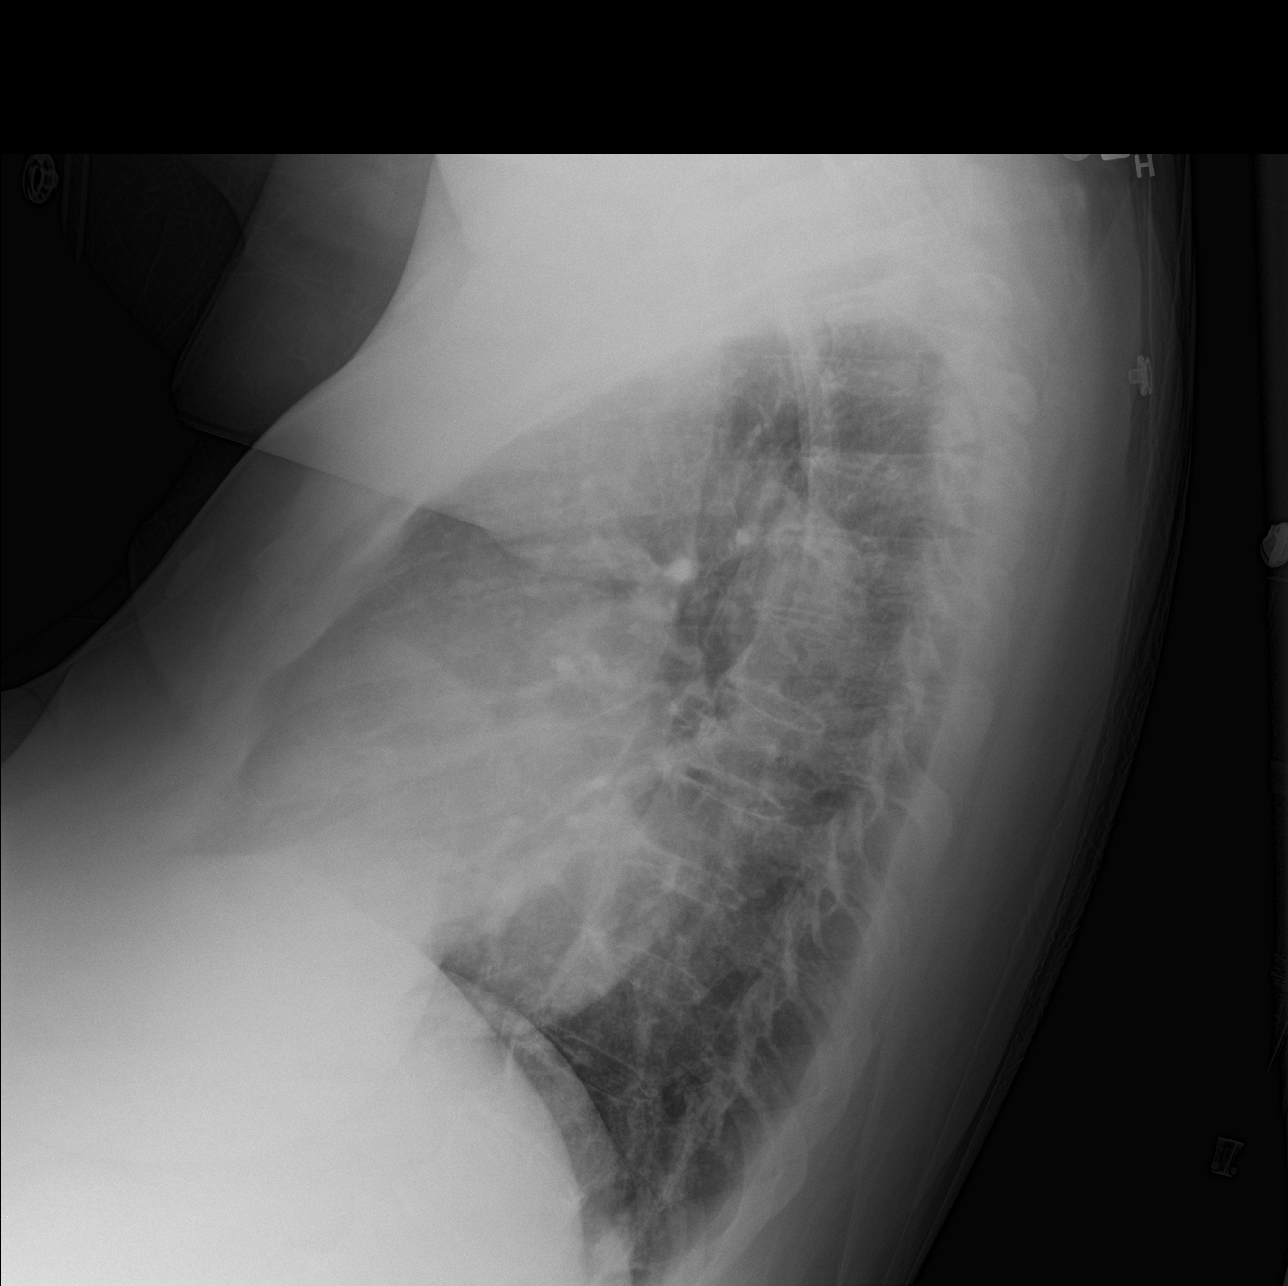

[chest ap]
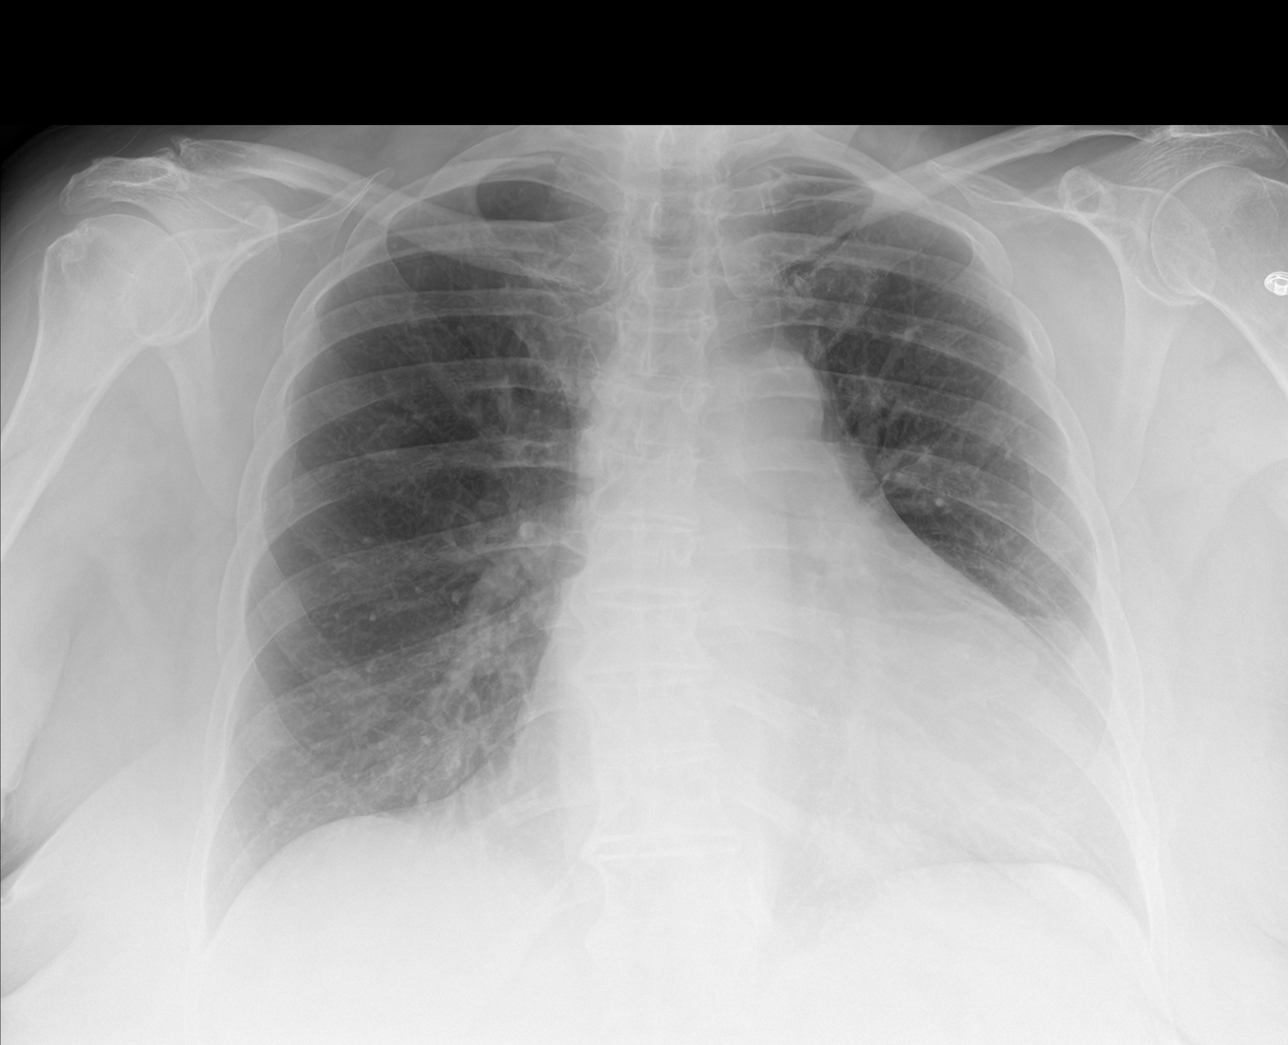

[chest lat (2 of 2)]
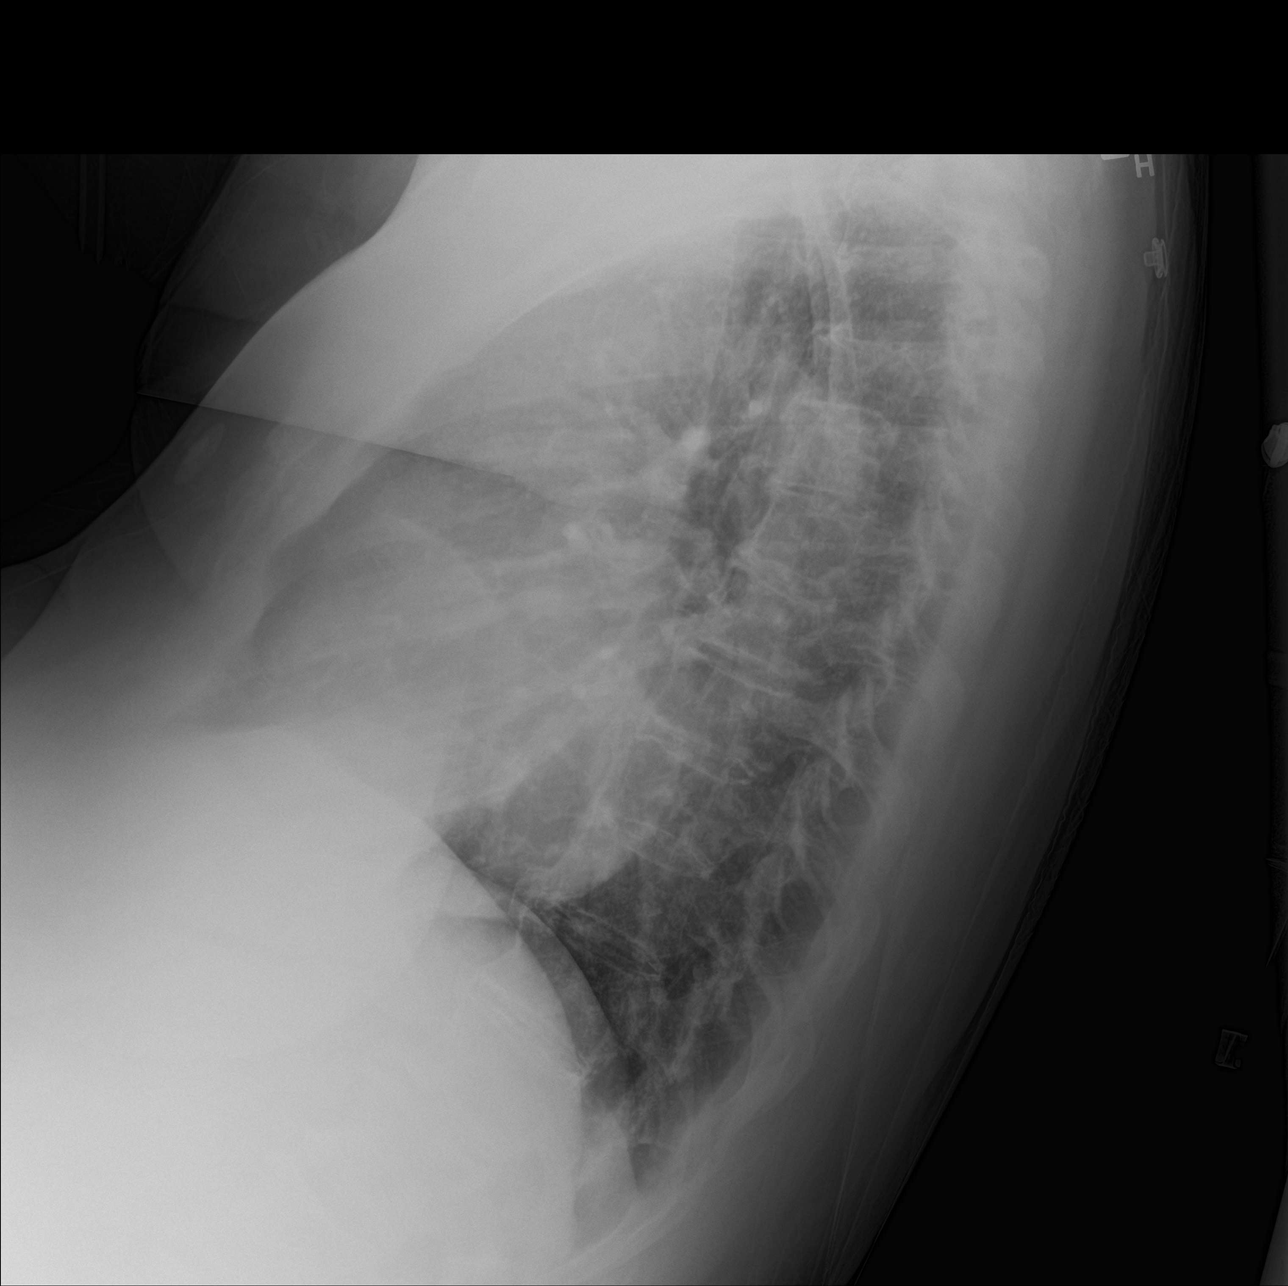

[3 of 3 positions shown; findings below may reference images not displayed]

FINDINGS: Mild enlargement of the cardiopericardial silhouette. No at edema
identified. Thoracic spondylosis. Lower cervical plate and screw
fixator. Degenerative spurring at both AC joints.

No pleural effusion.  The lungs appear clear.
IMPRESSION: 1. Mild enlargement of the cardiopericardial silhouette, without
edema.
2. Thoracic spondylosis.

## 2017-05-10 DIAGNOSIS — E119 Type 2 diabetes mellitus without complications: Secondary | ICD-10-CM | POA: Diagnosis not present

## 2017-05-10 DIAGNOSIS — I480 Paroxysmal atrial fibrillation: Secondary | ICD-10-CM | POA: Diagnosis not present

## 2017-05-10 DIAGNOSIS — M48062 Spinal stenosis, lumbar region with neurogenic claudication: Secondary | ICD-10-CM | POA: Diagnosis not present

## 2017-05-10 DIAGNOSIS — I110 Hypertensive heart disease with heart failure: Secondary | ICD-10-CM | POA: Diagnosis not present

## 2017-05-10 DIAGNOSIS — G35 Multiple sclerosis: Secondary | ICD-10-CM | POA: Diagnosis not present

## 2017-05-10 DIAGNOSIS — I509 Heart failure, unspecified: Secondary | ICD-10-CM | POA: Diagnosis not present

## 2017-05-14 ENCOUNTER — Ambulatory Visit (INDEPENDENT_AMBULATORY_CARE_PROVIDER_SITE_OTHER): Payer: Medicare Other | Admitting: Family Medicine

## 2017-05-14 VITALS — BP 113/79 | HR 84 | Temp 98.2°F | Ht 63.0 in | Wt 247.0 lb

## 2017-05-14 DIAGNOSIS — E669 Obesity, unspecified: Secondary | ICD-10-CM

## 2017-05-14 DIAGNOSIS — Z6841 Body Mass Index (BMI) 40.0 and over, adult: Secondary | ICD-10-CM

## 2017-05-14 DIAGNOSIS — IMO0001 Reserved for inherently not codable concepts without codable children: Secondary | ICD-10-CM

## 2017-05-14 DIAGNOSIS — R601 Generalized edema: Secondary | ICD-10-CM

## 2017-05-14 NOTE — Progress Notes (Signed)
Office: [phone]  /  Fax: [phone]   HPI:   Chief Complaint: OBESITY Toni [name] is here to discuss her progress with her obesity treatment plan. She is on the Category 2 plan and is following her eating plan approximately 90 % of the time. She states she is exercising 0 minutes 0 times per week. Toni [name]  Is retaining fluid today and states she has followed her plan closely but is not taking her Lasix regularly. Her weight is 247 lb (112 kg) today and has had a weight gain of 4 lbs over a period of 2 to 3 weeks since her last visit. She has lost 10 lbs since starting treatment with [name].  Edema Toni [name] is on Lasix 2 times per day but she only does this 3 times per week and takes 1 dose on other days. She notes feeling edematous, in her face, arms and torso as well as her legs. She has no worsening shortness of breath.   ALLERGIES: Allergies  Allergen Reactions  . Hydrocodone Nausea And Vomiting    Hydrocodone causes vomiting  . Oxycodone Nausea And Vomiting    Oral oxycodone causes vomiting  . Chlorhexidine Gluconate Rash    Sores at site  . Zoloft [Sertraline] Hives, Swelling and Rash    MEDICATIONS: Current Outpatient Prescriptions on File Prior to Visit  Medication Sig Dispense Refill  . acetaminophen (TYLENOL) 500 MG tablet Take 500-1,000 mg by mouth every 6 (six) hours as needed for fever or headache (or pain).    Marland Kitchen ALPRAZolam (XANAX) 0.5 MG tablet Take 1 tablet (0.5 mg total) by mouth 2 (two) times daily as needed for anxiety. 20 tablet 0  . baclofen (LIORESAL) 20 MG tablet Take 1 tablet (20 mg total) by mouth 3 (three) times daily as needed for muscle spasms. (Patient taking differently: Take 20 mg by mouth See admin instructions. Once to twice a day for breakthrough spasms) 180 each 3  . buPROPion (WELLBUTRIN XL) 300 MG 24 hr tablet Take 300 mg by mouth daily.    Marland Kitchen CALCIUM CITRATE PO Take 2 tablets by mouth 2 (two) times daily.    . cetirizine (ZYRTEC) 10 MG  tablet Take 10 mg by mouth daily.    . Cholecalciferol (VITAMIN D) 2000 units tablet Take 1 tablet (2,000 Units total) by mouth 2 (two) times daily.  0  . co-enzyme Q-10 30 MG capsule Take 30 mg by mouth daily.    . corticotropin (ATHACAR H.P.) 80 UNIT/ML injectable gel Inject 80 units subcutaneously once every other day  for a total of 10 days , repeat  every 30 days. 5 mL 5  . diltiazem (CARDIZEM CD) 120 MG 24 hr capsule TAKE 1 CAPSULE BY MOUTH  DAILY 90 capsule 2  . docusate sodium (COLACE) 100 MG capsule Take 1 capsule (100 mg total) by mouth daily. 10 capsule 0  . fluticasone (FLONASE) 50 MCG/ACT nasal spray Place 2 sprays into both nostrils daily as needed for allergies.     . furosemide (LASIX) 40 MG tablet Take 2 tablets (80 mg total) by mouth 2 (two) times daily. 180 tablet 3  . gabapentin (NEURONTIN) 300 MG capsule TAKE 1 CAPSULE BY MOUTH UP  TO 4 TIMES DAILY (Patient taking differently: TAKE 1 CAPSULE BY MOUTH UP  TO 3 times daily) 360 capsule 3  . GENERLAC 10 GM/15ML SOLN TAKE 7.5 ML BY MOUTH TWICE DAILY 900 mL 5  . Interferon Beta-1b (BETASERON/EXTAVIA) 0.3 MG KIT injection Every other day ,  subcutaneus. Please titrate pt based on normal titration schedule. 1 each 12  . levothyroxine (SYNTHROID, LEVOTHROID) 75 MCG tablet Take 75 mcg by mouth daily.    . Multiple Vitamins-Minerals (CENTRUM SILVER PO) Take 1 tablet by mouth daily.    . Nebivolol HCl (BYSTOLIC) 20 MG TABS Take 20 mg by mouth daily.    . Omega-3 Fatty Acids (FISH OIL) 1000 MG CAPS Take 1,000 mg by mouth daily.    Marland Kitchen omeprazole (PRILOSEC) 20 MG capsule Take 20 mg by mouth daily.     . psyllium (METAMUCIL SMOOTH TEXTURE) 28 % packet Take 1 packet by mouth every other day.    . ranitidine (ZANTAC) 150 MG tablet Take 150 mg by mouth at bedtime.     . simvastatin (ZOCOR) 20 MG tablet Take 10 mg by mouth at bedtime.     . SYRINGE/NEEDLE, DISP, 1 ML (BD LUER-LOK SYRINGE) 25G X 5/8" 1 ML MISC Use as directed to inject  Acthar 10  each prn  . Tamsulosin HCl (FLOMAX) 0.4 MG CAPS Take 0.4 mg by mouth daily.    Marland Kitchen UNABLE TO FIND Baclofen pump: Continuous    . warfarin (COUMADIN) 5 MG tablet TAKE 1/2 TO 1 TABLET BY  MOUTH DAILY AS DIRECTED BY  COUMADIN CLINIC. 90 tablet 0   Current Facility-Administered Medications on File Prior to Visit  Medication Dose Route Frequency Provider Last Rate Last Dose  . baclofen (LIORESAL) intrathecal injection 40 mg/35m  40 mg Intrathecal Once WKathrynn Ducking MD        PAST MEDICAL HISTORY: Past Medical History:  Diagnosis Date  . Abnormality of gait 03/01/2015  . Anxiety   . Arthritis    "knees" (01/19/2016)  . Atrial fibrillation with RVR ([place] 01/19/2016  . Back pain   . Chest pain   . Childhood asthma   . Chronic pain    "nerve pain from the MS"  . Constipation   . Depression   . D[place] (deep [place]) [place]   "RLE; may have just been phlebitis; it was before the age of dopplers"  . 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 GERD (gastroesophageal reflux disease)   . History of stress test 06/21/10   limited exam with some degree of breast attenuatin of the apex however a component of apical ischemia is not excluded, EF 62%; cardiac cath   . HLD (hyperlipidemia)   . Hx of echocardiogram 04/22/10   EF 55-60%, no valve issues  . Hypertension   . Hypothyroidism   . Joint pain   . Migraine    "none since I stopped beta blocker in 1990s" (01/19/2016)  . Multiple sclerosis ([place]    has had this may 1989-[place] reg doc  . OSA on CPAP   . Osteoarthritis   . PAF (paroxysmal atrial fibrillation) ([place]    on coumadin; documented on monitor 06/2010  . Pneumonia 11/2011  . Shortness of breath   . Thyroid disease     PAST SURGICAL HISTORY: Past Surgical History:  Procedure Laterality Date  . ANTERIOR CERVICAL DECOMP/DISCECTOMY FUSION  01/2004  . BACK SURGERY    . CARDIAC CATHETERIZATION  06/22/2010   normal  coronary arteries, PAF  . CATARACT EXTRACTION, BILATERAL Bilateral   . CHILD BIRTHS     X2  . DILATION AND CURETTAGE OF UTERUS    . INFUSION PUMP IMPLANTATION  ~ 2009  baclofen infusion in lower abd  . PAIN PUMP REVISION N/A 07/29/2014   Procedure: Baclofen pump replacement;  Surgeon: Erline Levine, MD;  Location: [facility];  Service: Neurosurgery;  Laterality: N/A;  Baclofen pump replacement  . TONSILLECTOMY AND ADENOIDECTOMY    . VARICOSE VEIN SURGERY  ~ 1968  . VENOUS ABLATION  12/16/2010   radiofreq ablation -Dr Elisabeth Cara and [facility]  . WISDOM TOOTH EXTRACTION      SOCIAL HISTORY: Social History  Substance Use Topics  . Smoking status: Former Smoker    Packs/day: 0.50    Years: 7.00    Types: Cigarettes    Quit date: 08/21/1976  . Smokeless tobacco: Never Used  . Alcohol use 0.6 oz/week    1 Glasses of wine per week    FAMILY HISTORY: Family History  Problem Relation Age of Onset  . Coronary artery disease Father        at age 58  . Hyperlipidemia Father   . Thyroid disease Father   . Coronary artery disease Maternal Grandmother   . Depression Maternal Grandmother   . Cancer Paternal Grandmother   . Depression Mother   . Sudden death Mother   . Anxiety disorder Mother   . Alcoholism Son     ROS: Review of Systems  Constitutional: Negative for weight loss.  Cardiovascular:       Positive edema    PHYSICAL EXAM: Blood pressure 113/79, pulse 84, temperature 98.2 F (36.8 C), temperature source Oral, height '5\' 3"'  (1.6 m), weight 247 lb (112 kg), SpO2 97 %. Body mass index is 43.75 kg/m. Physical Exam  Constitutional: She is oriented to person, place, and time. She appears well-developed and well-nourished.  Cardiovascular: Normal rate.   Pulmonary/Chest: Effort normal.  Musculoskeletal: Normal range of motion. She exhibits edema.  Neurological: She is oriented to person, place, and time.  Skin: Skin is warm and dry.  Psychiatric: She has a normal mood and  affect. Her behavior is normal.  Vitals reviewed.   RECENT LABS AND TESTS: BMET    Component Value Date/Time   NA 138 04/05/2017 1450   K 4.2 04/05/2017 1450   CL 97 04/05/2017 1450   CO2 25 04/05/2017 1450   GLUCOSE 113 (H) 04/05/2017 1450   GLUCOSE 155 (H) 12/27/2016 0410   BUN 27 04/05/2017 1450   CREATININE 0.73 04/05/2017 1450   CALCIUM 9.5 04/05/2017 1450   GFRNONAA 84 04/05/2017 1450   GFRAA 97 04/05/2017 1450   Lab Results  Component Value Date   HGBA1C 5.4 03/08/2017   HGBA1C  04/21/2010    5.5 (NOTE)                                                                       According to the ADA Clinical Practice Recommendations for 2011, when HbA1c is used as a screening test:   >=6.5%   Diagnostic of Diabetes Mellitus           (if abnormal result  is confirmed)  5.7-6.4%   Increased risk of developing Diabetes Mellitus  References:Diagnosis and Classification of Diabetes Mellitus,Diabetes KWIO,9735,32(DJMEQ 1):S62-S69 and Standards of Medical Care in         Diabetes - 2011,Diabetes Care,2011,34  (Suppl 1):S11-S61.  Lab Results  Component Value Date   INSULIN 20.4 03/08/2017   CBC    Component Value Date/Time   WBC 6.4 03/08/2017 1152   WBC 8.7 12/27/2016 0410   RBC 4.64 03/08/2017 1152   RBC 4.31 12/27/2016 0410   HGB 14.1 03/08/2017 1152   HCT 43.0 03/08/2017 1152   PLT 174 12/27/2016 0410   PLT 219 12/18/2014 1157   MCV 93 03/08/2017 1152   MCH 30.4 03/08/2017 1152   MCH 29.5 12/27/2016 0410   MCHC 32.8 03/08/2017 1152   MCHC 33.0 12/27/2016 0410   RDW 13.3 03/08/2017 1152   LYMPHSABS 2.2 03/08/2017 1152   MONOABS 0.4 12/24/2016 1442   EOSABS 0.1 03/08/2017 1152   BASOSABS 0.0 03/08/2017 1152   Iron/TIBC/Ferritin/ %Sat No results found for: IRON, TIBC, FERRITIN, IRONPCTSAT Lipid Panel     Component Value Date/Time   CHOL 143 03/08/2017 1152   TRIG 89 03/08/2017 1152   HDL 57 03/08/2017 1152   CHOLHDL 3.0 04/22/2010 0536   VLDL 29 04/22/2010  0536   LDLCALC 68 03/08/2017 1152   Hepatic Function Panel     Component Value Date/Time   PROT 6.0 04/05/2017 1450   ALBUMIN 3.9 04/05/2017 1450   AST 17 04/05/2017 1450   ALT 18 04/05/2017 1450   ALKPHOS 56 04/05/2017 1450   BILITOT 0.2 04/05/2017 1450      Component Value Date/Time   TSH 2.410 03/08/2017 1152   TSH 1.083 08/06/2016 0647   TSH 1.871 01/19/2016 2302   TSH 0.206 (L) 03/10/2011 1351   TSH 1.636 06/18/2010 1430    ASSESSMENT AND PLAN: Generalized edema  Class 3 obesity with serious comorbidity and body mass index (BMI) of 40.0 to 44.9 in adult, unspecified obesity type ([place])  PLAN:  Edema Tonika was encouraged to take at least 1/2 dose of her second Lasix dose to help with edema and avoid was advised to avoid soda and simple carbohydrates.  We spent > than 50% of the 15 minute visit on the counseling as documented in the note.   Obesity Reighn is currently in the action stage of change. As such, her goal is to continue with weight loss efforts She has agreed to follow the Category 2 plan Anyiah has been instructed to work up to a goal of 150 minutes of combined cardio and strengthening exercise per week for weight loss and overall health benefits. We discussed the following Behavioral Modification Strategies today: increasing lean protein intake, increasing vegetables and decreasing sodium intake  Salma has agreed to follow up with our clinic in 4 weeks and will follow up with our registered dietician in 2 weeks. She was informed of the importance of frequent follow up visits to maximize her success with intensive lifestyle modifications for her multiple health conditions.  I, Doreene Nest, am acting as transcriptionist for Dennard Nip, MD  I have reviewed the above documentation for accuracy and completeness, and I agree with the above. -Dennard Nip, MD   OBESITY BEHAVIORAL INTERVENTION VISIT  Today's visit was # 5 out of  22.  Starting weight: 257 lbs Starting date: 03/08/17 Today's weight : 247 lbs Today's date: 05/14/2017 Total lbs lost to date: 10 (Patients must lose 7 lbs in the first 6 months to continue with counseling)   ASK: We discussed the diagnosis of obesity with [name] today and Christyne agreed to give [name] permission to discuss obesity behavioral modification therapy today.  ASSESS: Parthenia has the diagnosis of obesity  and her BMI today is 43.76 Jonay is in the action stage of change   ADVISE: Rayleen was educated on the multiple health risks of obesity as well as the benefit of weight loss to improve her health. She was advised of the need for long term treatment and the importance of lifestyle modifications.  AGREE: Multiple dietary modification options and treatment options were discussed and  Kemba agreed to follow the Category 2 plan We discussed the following Behavioral Modification Strategies today: increasing lean protein intake, increasing vegetables and decreasing sodium intake

## 2017-05-15 ENCOUNTER — Ambulatory Visit: Payer: Medicare Other | Admitting: Nurse Practitioner

## 2017-05-15 DIAGNOSIS — I480 Paroxysmal atrial fibrillation: Secondary | ICD-10-CM | POA: Diagnosis not present

## 2017-05-15 DIAGNOSIS — E119 Type 2 diabetes mellitus without complications: Secondary | ICD-10-CM | POA: Diagnosis not present

## 2017-05-15 DIAGNOSIS — M48062 Spinal stenosis, lumbar region with neurogenic claudication: Secondary | ICD-10-CM | POA: Diagnosis not present

## 2017-05-15 DIAGNOSIS — G35 Multiple sclerosis: Secondary | ICD-10-CM | POA: Diagnosis not present

## 2017-05-15 DIAGNOSIS — I110 Hypertensive heart disease with heart failure: Secondary | ICD-10-CM | POA: Diagnosis not present

## 2017-05-15 DIAGNOSIS — I509 Heart failure, unspecified: Secondary | ICD-10-CM | POA: Diagnosis not present

## 2017-05-17 ENCOUNTER — Ambulatory Visit (INDEPENDENT_AMBULATORY_CARE_PROVIDER_SITE_OTHER): Payer: Medicare Other | Admitting: Internal Medicine

## 2017-05-17 ENCOUNTER — Ambulatory Visit: Payer: Medicare Other | Admitting: Internal Medicine

## 2017-05-17 ENCOUNTER — Encounter: Payer: Self-pay | Admitting: Internal Medicine

## 2017-05-17 ENCOUNTER — Ambulatory Visit (INDEPENDENT_AMBULATORY_CARE_PROVIDER_SITE_OTHER): Payer: Medicare Other | Admitting: Pharmacist

## 2017-05-17 VITALS — BP 112/62 | HR 96 | Ht 63.0 in | Wt 246.8 lb

## 2017-05-17 DIAGNOSIS — R600 Localized edema: Secondary | ICD-10-CM

## 2017-05-17 DIAGNOSIS — I482 Chronic atrial fibrillation: Secondary | ICD-10-CM

## 2017-05-17 DIAGNOSIS — I4821 Permanent atrial fibrillation: Secondary | ICD-10-CM

## 2017-05-17 DIAGNOSIS — Z7901 Long term (current) use of anticoagulants: Secondary | ICD-10-CM | POA: Diagnosis not present

## 2017-05-17 DIAGNOSIS — I5033 Acute on chronic diastolic (congestive) heart failure: Secondary | ICD-10-CM

## 2017-05-17 DIAGNOSIS — I481 Persistent atrial fibrillation: Secondary | ICD-10-CM

## 2017-05-17 DIAGNOSIS — I4811 Longstanding persistent atrial fibrillation: Secondary | ICD-10-CM

## 2017-05-17 LAB — POCT INR: INR: 2.2

## 2017-05-17 NOTE — Patient Instructions (Signed)
Your physician has recommended you make the following change in your medication: -- DECREASE lasix to 80mg  in the morning and 40mg  in the afternoon  Your physician wants you to follow-up in: 6 months with Dr. Debara Pickett. You will receive a reminder letter in the mail two months in advance. If you don't receive a letter, please call our office to schedule the follow-up appointment.

## 2017-05-18 NOTE — Progress Notes (Signed)
OFFICE NOTE  Chief Complaint:  Improved swelling/weight loss  Primary Care Physician: Prince Solian, MD  HPI:  Toni [name] is a 71 year old female with multiple sclerosis, paroxysmal atrial fibrillation, on Coumadin, morbid obesity, obstructive sleep apnea, and dyslipidemia. She has had worsening swelling in her legs, thought to be due to an MS flare. She has a history of varicose veins with severe venous insufficiency in both the right and left greater saphenous veins. The right greater saphenous vein was actually successfully ablated by my partner, Dr. Elisabeth Cara, about 1 to 2 years ago. She does have residual reflux in the left greater saphenous vein extending from the proximal calf up to the saphenofemoral junction. The vein measured between 4 and 8 mm with between 1 and 6 seconds of reflux. We had discussed ablation of that before, but she has canceled several times at procedures. At this point, I think there is little benefit to continuing to try for her to undergo ablation. 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 She can wear work showed a compression stockings and this does seem to help her. With regards to her atrial fibrillation she has not had significant recurrence to her knowledge. She continues on warfarin and has been therapeutic, with her INR of 1.9 in the office by fingerstick today.  Toni [name] returns today for followup. She reports her MS is somewhat progressive. She continues to have some enlargement of her legs but no significant swelling. There is a mild varicosities in the right lower extremity. She is unaware of her atrial fibrillation. INR today was 2.3.   I saw Toni [name] back in the office today,  She is describing more shortness of breath and feels that she's been in A. Fib more recently. In fact today6/21her EKG does show persistent atrial fibrillation. In the past she's been paroxysmal , but it may be that she is more  frequently having A. Fib. She has trouble telling whether not she is more short of breath or symptomatic from A. Fib versus her multiple sclerosis. She is also reporting more swelling in her left leg.  Toni [name] returns for follow-up today. She recently saw her neurologist regarding her MS which seems to be flaring. In addition she was noted to have significant apneic events despite using CPAP. There is concern for possible central sleep apnea. Both of these episodes are probably contributing to her recurrent atrial fibrillation. She's been wearing a monitor for a period of time which indicates a burden of A. fib of about 80% of the time. It does not appear that she is in persistent A. fib, rather she has had some episodes of sinus rhythm, but they're much less frequent than her A. Fib.  I saw Toni [name] back today in the office. Recently I saw her as an add-on visit briefly as she was describing some chest pain when she was getting her INR checked. An EKG at that time showed no ischemia. I did not feel that this was cardiac. She continues intermittently have abnormal pains both in her chest as well as her abdomen and extremities. This may be related to MS. She's had difficulty with CPAP and apparently getting a good mask fit. She has been a persistent atrial fibrillation for about 6 months. It's unclear whether or not she symptomatic with this and therefore I have not pursued a rhythm control strategy. Her rate control is generally pretty good and she has been therapeutic  on warfarin. She is reporting worsening swelling of her legs although her weight she says is been stable. She has a history of venous insufficiency and had a right greater saphenous vein ablation by one of my partners. She's concerned about more swelling in her left leg and whether she's developed worsening venous insufficiency. She also reports cold feet with numbness in her extremities. As she is basically wheelchair bound, I cannot assess  whether she is having any claudication.  Toni [name] returns today for follow-up. She underwent venous and arterial lower external Dopplers. Arterial Dopplers were normal which showed no evidence of obstruction. Her venous Doppler showed no DVT. There was significant venous reflux in the left greater saphenous vein. This is an area where she has swelling and discomfort. This may be amenable to treatment. I increased her Lasix and she reports some improvement in her leg swelling. She's trying to elevate her feet and reports she is intolerant of wearing compression stockings.  02/03/2016  Toni [name] was seen today in the office for follow-up of recent hospitalization. She was seeing her neurologist and complained of left chest and left arm pain and was referred to the hospital. She ruled out for MI and was felt that her chest pain was on coronary. She underwent an echocardiogram which showed low-normal EF but was also found to be in A. fib with rapid ventricular response. Her A. fib is persistent at this time but the rate was elevated. She was started on Cardizem which helped improve her rate and was discharged on Cardizem CD 120 mg daily. Heart rate today is better controlled in the 70s. She reports her arm pain is improved and she said today that she "did not feel that it was her heart".  05/09/2016  Toni [name] was seen in the office for follow-up today. She reports improved heart rate control with heart rates between 50 and 80 on diltiazem. Over this will help with her mild cardiomyopathy with EF 50-55% on recent echo. She reports some increased fluid gain particularly in her abdomen. This may be due to some degree of right heart dysfunction. She's working with her neurologist for MS as well as her sleep apnea. There is a component of central sleep apnea and she is reportedly going to have additional sleep study to look for the need for BiPAP therapy. She denies any recurrent chest pain or left arm  pain.  02/14/2017  Toni [name] was seen today in follow-up. She was recently hospitalized. She said problems with increasing fluid gain. I increased her Lasix last time however when she was hospitalized she required high-dose Lasix and says she has she felt better with that. She subsequently been put on BiPAP therapy. She says her sleeping is improved significantly. She is concerned though recently she's had some increased weight gain. She feels like she may need an increasing dose of diuretic. Her last echo was in December 2017 which showed an EF of 55%. This is been stable. If this is some heart failure, may be related diastolic dysfunction however suspect is more likely related to autonomic dysfunction the setting of multiple sclerosis.  03/29/2017  Toni [name] returns today for follow-up. She continues to report lower extremity edema and abdominal swelling. We'll increase her Lasix however she has not noted significant weight loss. It is actually very difficult to estimate her weight because of her MS. We tried to place her on the scale today however she was unable to stand long enough to get an  accurate weight. She reports urination was not as good on 40 mg of Lasix but is currently taking 80 mg once daily and has better urination with that.  05/18/2017  Toni [name] was seen today in follow-up. She reports some improvement in her abdominal swelling and bloating. Check she feels like she might be somewhat dry now. Her weight is down to 246 pounds from 253 pounds. She is also working with the weight loss clinic at [facility]. She is interested in decreasing her diuretic which is reasonable. She asked again about probably trying to get back into sinus rhythm however she's been in persistent A. fib which is long-standing at this point for more than one or 2 years. Her last echo in December 2017 showed mild left atrial enlargement. Is not clear that she's symptomatic with her A. fib although does feel tired at  times. She says she's had some improvement with her sleep apnea therapy.  PMHx:  Past Medical History:  Diagnosis Date  . Abnormality of gait 03/01/2015  . Anxiety   . Arthritis    "knees" (01/19/2016)  . Atrial fibrillation with RVR ([place]) 01/19/2016  . Back pain   . Chest pain   . Childhood asthma   . Chronic pain    "nerve pain from the MS"  . Constipation   . Depression   . DVT (deep venous thrombosis) ([place]) 1983   "RLE; may have just been phlebitis; it was before the age of dopplers"  . 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 GERD (gastroesophageal reflux disease)   . History of stress test 06/21/10   limited exam with some degree of breast attenuatin of the apex however a component of apical ischemia is not excluded, EF 62%; cardiac cath   . HLD (hyperlipidemia)   . Hx of echocardiogram 04/22/10   EF 55-60%, no valve issues  . Hypertension   . Hypothyroidism   . Joint pain   . Migraine    "none since I stopped beta blocker in 1990s" (01/19/2016)  . Multiple sclerosis ([place])    has had this may 1989-[facility] reg doc  . OSA on CPAP   . Osteoarthritis   . PAF (paroxysmal atrial fibrillation) ([place])    on coumadin; documented on monitor 06/2010  . Pneumonia 11/2011  . Shortness of breath   . Thyroid disease     Past Surgical History:  Procedure Laterality Date  . ANTERIOR CERVICAL DECOMP/DISCECTOMY FUSION  01/2004  . BACK SURGERY    . CARDIAC CATHETERIZATION  06/22/2010   normal coronary arteries, PAF  . CATARACT EXTRACTION, BILATERAL Bilateral   . CHILD BIRTHS     X2  . DILATION AND CURETTAGE OF UTERUS    . INFUSION PUMP IMPLANTATION  ~ 2009   baclofen infusion in lower abd  . PAIN PUMP REVISION N/A 07/29/2014   Procedure: Baclofen pump replacement;  Surgeon: Erline Levine, MD;  Location: [facility];  Service: Neurosurgery;  Laterality: N/A;  Baclofen pump replacement  . TONSILLECTOMY AND ADENOIDECTOMY    . VARICOSE  VEIN SURGERY  ~ 1968  . VENOUS ABLATION  12/16/2010   radiofreq ablation -Dr Elisabeth Cara and [facility]  . WISDOM TOOTH EXTRACTION      FAMHx:  Family History  Problem Relation Age of Onset  . Coronary artery disease Father        at age 54  . Hyperlipidemia Father   .  Thyroid disease Father   . Coronary artery disease Maternal Grandmother   . Depression Maternal Grandmother   . Cancer Paternal Grandmother   . Depression Mother   . Sudden death Mother   . Anxiety disorder Mother   . Alcoholism Son     SOCHx:   reports that she quit smoking about 40 years ago. Her smoking use included Cigarettes. She has a 3.50 pack-year smoking history. She has never used smokeless tobacco. She reports that she drinks about 0.6 oz of alcohol per week . She reports that she does not use drugs.  ALLERGIES:  Allergies  Allergen Reactions  . Hydrocodone Nausea And Vomiting    Hydrocodone causes vomiting  . Oxycodone Nausea And Vomiting    Oral oxycodone causes vomiting  . Chlorhexidine Gluconate Rash    Sores at site  . Zoloft [Sertraline] Hives, Swelling and Rash    ROS: Pertinent items noted in HPI and remainder of comprehensive ROS otherwise negative.  HOME MEDS: Current Outpatient Prescriptions  Medication Sig Dispense Refill  . acetaminophen (TYLENOL) 500 MG tablet Take 500-1,000 mg by mouth every 6 (six) hours as needed for fever or headache (or pain).    Marland Kitchen ALPRAZolam (XANAX) 0.5 MG tablet Take 1 tablet (0.5 mg total) by mouth 2 (two) times daily as needed for anxiety. 20 tablet 0  . baclofen (LIORESAL) 20 MG tablet Take 1 tablet (20 mg total) by mouth 3 (three) times daily as needed for muscle spasms. (Patient taking differently: Take 20 mg by mouth See admin instructions. Once to twice a day for breakthrough spasms) 180 each 3  . buPROPion (WELLBUTRIN XL) 300 MG 24 hr tablet Take 300 mg by mouth daily.    Marland Kitchen CALCIUM CITRATE PO Take 2 tablets by mouth 2 (two) times daily.    . cetirizine  (ZYRTEC) 10 MG tablet Take 10 mg by mouth daily.    . Cholecalciferol (VITAMIN D) 2000 units tablet Take 1 tablet (2,000 Units total) by mouth 2 (two) times daily.  0  . co-enzyme Q-10 30 MG capsule Take 30 mg by mouth daily.    . corticotropin (ATHACAR H.P.) 80 UNIT/ML injectable gel Inject 80 units subcutaneously once every other day  for a total of 10 days , repeat  every 30 days. 5 mL 5  . diltiazem (CARDIZEM CD) 120 MG 24 hr capsule TAKE 1 CAPSULE BY MOUTH  DAILY 90 capsule 2  . docusate sodium (COLACE) 100 MG capsule Take 1 capsule (100 mg total) by mouth daily. 10 capsule 0  . fluticasone (FLONASE) 50 MCG/ACT nasal spray Place 2 sprays into both nostrils daily as needed for allergies.     . furosemide (LASIX) 40 MG tablet Take 2 tablets (67m) by mouth in the morning and 1 tablet (426m in the afternoon    . gabapentin (NEURONTIN) 300 MG capsule TAKE 1 CAPSULE BY MOUTH UP  TO 4 TIMES DAILY (Patient taking differently: TAKE 1 CAPSULE BY MOUTH UP  TO 3 times daily) 360 capsule 3  . GENERLAC 10 GM/15ML SOLN TAKE 7.5 ML BY MOUTH TWICE DAILY 900 mL 5  . Interferon Beta-1b (BETASERON/EXTAVIA) 0.3 MG KIT injection Every other day , subcutaneus. Please titrate pt based on normal titration schedule. 1 each 12  . levothyroxine (SYNTHROID, LEVOTHROID) 75 MCG tablet Take 75 mcg by mouth daily.    . Multiple Vitamins-Minerals (CENTRUM SILVER PO) Take 1 tablet by mouth daily.    . Nebivolol HCl (BYSTOLIC) 20 MG TABS Take 20  mg by mouth daily.    . Omega-3 Fatty Acids (FISH OIL) 1000 MG CAPS Take 1,000 mg by mouth daily.    Marland Kitchen omeprazole (PRILOSEC) 20 MG capsule Take 20 mg by mouth daily.     . psyllium (METAMUCIL SMOOTH TEXTURE) 28 % packet Take 1 packet by mouth every other day.    . ranitidine (ZANTAC) 150 MG tablet Take 150 mg by mouth at bedtime.     . simvastatin (ZOCOR) 20 MG tablet Take 10 mg by mouth at bedtime.     . SYRINGE/NEEDLE, DISP, 1 ML (BD LUER-LOK SYRINGE) 25G X 5/8" 1 ML MISC Use as  directed to inject  Acthar 10 each prn  . Tamsulosin HCl (FLOMAX) 0.4 MG CAPS Take 0.4 mg by mouth daily.    Marland Kitchen UNABLE TO FIND Baclofen pump: Continuous    . warfarin (COUMADIN) 5 MG tablet TAKE 1/2 TO 1 TABLET BY  MOUTH DAILY AS DIRECTED BY  COUMADIN CLINIC. 90 tablet 0   Current Facility-Administered Medications  Medication Dose Route Frequency Provider Last Rate Last Dose  . baclofen (LIORESAL) intrathecal injection 40 mg/40m  40 mg Intrathecal Once WKathrynn Ducking MD        LABS/IMAGING: Results for orders placed or performed in visit on 05/17/17 (from the past 48 hour(s))  POCT INR     Status: None   Collection Time: 05/17/17  3:07 PM  Result Value Ref Range   INR 2.2    No results found.  VITALS: BP 112/62   Pulse 96   Ht '5\' 3"'  (1.6 m)   Wt 246 lb 12.8 oz (111.9 kg)   BMI 43.72 kg/m   EXAM: Deferred  EKG: Deferred  ASSESSMENT: 1. Persistent atrial fibrillation - 80% frequency of A. Fib (not clear if symptomatic) 2. Morbid obesity 3. Obstructive sleep apnea on BIPAP 4. Multiple sclerosis 5. Chronic anticoagulation on warfarin - INR 2.3 today 6. Chronic venous insufficiency - +LGSV reflux 7. Hypertension-controlled 8. Noncardiac chest and left arm pain 9. Non-ischemic cardiomyopathy - EF 50%, improved to 55% (07/2016) 10. Acute on chronic diastolic congestive heart failure  PLAN: 1.   Mrs. HHolstadHas had improvement in her edema. We will decrease her Lasix to 80 mg every morning and 40 mg every afternoon. She's losing weight possibly from diuresis as well as working with the weight loss center. She remains in long-standing persistent A. fib. Is not clear she's been symptomatic with this. She does report some fatigue. Her last left atrial size was mildly enlarged in December 2017. EF has normalized at that time. Since LV function is normal and heart rate is controlled with A. fib, would not pursue attempted cardioversion. I think there is a low likelihood of success.  I conveyed this to her today in the office.  Continue current medication will follow-up in 6 months.  KPixie Casino MD, FSebasticook Valley HospitalAttending Cardiologist CHepzibah9/28/2018, 2:09 PM

## 2017-05-24 DIAGNOSIS — I480 Paroxysmal atrial fibrillation: Secondary | ICD-10-CM | POA: Diagnosis not present

## 2017-05-24 DIAGNOSIS — E668 Other obesity: Secondary | ICD-10-CM | POA: Diagnosis not present

## 2017-05-24 DIAGNOSIS — I10 Essential (primary) hypertension: Secondary | ICD-10-CM | POA: Diagnosis not present

## 2017-05-24 DIAGNOSIS — M6740 Ganglion, unspecified site: Secondary | ICD-10-CM | POA: Diagnosis not present

## 2017-05-24 DIAGNOSIS — G4733 Obstructive sleep apnea (adult) (pediatric): Secondary | ICD-10-CM | POA: Diagnosis not present

## 2017-05-24 DIAGNOSIS — R7301 Impaired fasting glucose: Secondary | ICD-10-CM | POA: Diagnosis not present

## 2017-05-24 DIAGNOSIS — Z23 Encounter for immunization: Secondary | ICD-10-CM | POA: Diagnosis not present

## 2017-05-24 DIAGNOSIS — G35 Multiple sclerosis: Secondary | ICD-10-CM | POA: Diagnosis not present

## 2017-05-25 DIAGNOSIS — M48062 Spinal stenosis, lumbar region with neurogenic claudication: Secondary | ICD-10-CM | POA: Diagnosis not present

## 2017-05-25 DIAGNOSIS — E119 Type 2 diabetes mellitus without complications: Secondary | ICD-10-CM | POA: Diagnosis not present

## 2017-05-25 DIAGNOSIS — G35 Multiple sclerosis: Secondary | ICD-10-CM | POA: Diagnosis not present

## 2017-05-25 DIAGNOSIS — I110 Hypertensive heart disease with heart failure: Secondary | ICD-10-CM | POA: Diagnosis not present

## 2017-05-25 DIAGNOSIS — I509 Heart failure, unspecified: Secondary | ICD-10-CM | POA: Diagnosis not present

## 2017-05-25 DIAGNOSIS — I480 Paroxysmal atrial fibrillation: Secondary | ICD-10-CM | POA: Diagnosis not present

## 2017-05-25 NOTE — Telephone Encounter (Signed)
ERROR

## 2017-05-28 ENCOUNTER — Encounter (INDEPENDENT_AMBULATORY_CARE_PROVIDER_SITE_OTHER): Payer: Self-pay

## 2017-05-28 ENCOUNTER — Ambulatory Visit (INDEPENDENT_AMBULATORY_CARE_PROVIDER_SITE_OTHER): Payer: Medicare Other | Admitting: Dietician

## 2017-05-29 DIAGNOSIS — M48062 Spinal stenosis, lumbar region with neurogenic claudication: Secondary | ICD-10-CM | POA: Diagnosis not present

## 2017-05-29 DIAGNOSIS — I480 Paroxysmal atrial fibrillation: Secondary | ICD-10-CM | POA: Diagnosis not present

## 2017-05-29 DIAGNOSIS — E119 Type 2 diabetes mellitus without complications: Secondary | ICD-10-CM | POA: Diagnosis not present

## 2017-05-29 DIAGNOSIS — I509 Heart failure, unspecified: Secondary | ICD-10-CM | POA: Diagnosis not present

## 2017-05-29 DIAGNOSIS — G35 Multiple sclerosis: Secondary | ICD-10-CM | POA: Diagnosis not present

## 2017-05-29 DIAGNOSIS — I110 Hypertensive heart disease with heart failure: Secondary | ICD-10-CM | POA: Diagnosis not present

## 2017-05-31 DIAGNOSIS — G4733 Obstructive sleep apnea (adult) (pediatric): Secondary | ICD-10-CM | POA: Diagnosis not present

## 2017-05-31 DIAGNOSIS — E119 Type 2 diabetes mellitus without complications: Secondary | ICD-10-CM | POA: Diagnosis not present

## 2017-05-31 DIAGNOSIS — M48062 Spinal stenosis, lumbar region with neurogenic claudication: Secondary | ICD-10-CM | POA: Diagnosis not present

## 2017-05-31 DIAGNOSIS — I480 Paroxysmal atrial fibrillation: Secondary | ICD-10-CM | POA: Diagnosis not present

## 2017-05-31 DIAGNOSIS — E785 Hyperlipidemia, unspecified: Secondary | ICD-10-CM | POA: Diagnosis not present

## 2017-05-31 DIAGNOSIS — G35 Multiple sclerosis: Secondary | ICD-10-CM | POA: Diagnosis not present

## 2017-05-31 DIAGNOSIS — F329 Major depressive disorder, single episode, unspecified: Secondary | ICD-10-CM | POA: Diagnosis not present

## 2017-05-31 DIAGNOSIS — I509 Heart failure, unspecified: Secondary | ICD-10-CM | POA: Diagnosis not present

## 2017-05-31 DIAGNOSIS — I110 Hypertensive heart disease with heart failure: Secondary | ICD-10-CM | POA: Diagnosis not present

## 2017-05-31 DIAGNOSIS — F419 Anxiety disorder, unspecified: Secondary | ICD-10-CM | POA: Diagnosis not present

## 2017-06-04 ENCOUNTER — Ambulatory Visit (INDEPENDENT_AMBULATORY_CARE_PROVIDER_SITE_OTHER): Payer: Medicare Other | Admitting: Dietician

## 2017-06-04 VITALS — Ht 63.0 in | Wt 239.0 lb

## 2017-06-04 DIAGNOSIS — Z9189 Other specified personal risk factors, not elsewhere classified: Secondary | ICD-10-CM

## 2017-06-04 DIAGNOSIS — R7303 Prediabetes: Secondary | ICD-10-CM

## 2017-06-04 DIAGNOSIS — Z6841 Body Mass Index (BMI) 40.0 and over, adult: Secondary | ICD-10-CM

## 2017-06-04 NOTE — Telephone Encounter (Signed)
Closing Encounter.

## 2017-06-04 NOTE — Progress Notes (Signed)
  Office: [phone]  /  Fax: [phone]  Toni [name] is here today for diabetes prevention counseling. She is at higher than average risk due to her diagnosis of pre-diabetes and obesity.  We discussed intensive lifestyle modifications today with an emphasis on weight loss including decreasing simple carbohydrates in her diet  Patient was educated about food nutrients ie protein, fats, simple and complex carbohydrates and how these affect insulin response. Focus on portion control,  avoiding simple carbohydrates and lower fat foods for ongoing wt loss efforts and glucose management. She is not currently checking her glucose at home. Her exercise is limited due to her MS, she is mostly wheelchair bound at this time. She has had a weight loss of 7 lbs since her last visit and a total of 18 lbs since starting treatment with [name].     Toni [name] is on the following meal plan category 2. She was given appropriate meal substitutions today.  Her meal plan was individualized for maximum benefit.  Also discussed at length the following behavioral modifications to help maximize  success increasing lean protein intake, decreasing simple carbohydrates,  meal planning and cooking strategies, menu and recipe ideas.    Written information was provided and the following handouts were given: additional healthy meal recommendations.    Office: [phone]  /  Fax: [phone]  OBESITY BEHAVIORAL INTERVENTION VISIT  Today's visit was # 6 out of 22.  Starting weight: 257 lbs Starting date: 03/08/17 Today's weight : Weight: 239 lb (108.4 kg)  Today's date: 06/04/2017 Total lbs lost to date: 18 lbs (Patients must lose 7 lbs in the first 6 months to continue with counseling)   ASK: We discussed the diagnosis of obesity with Toni [name] today and Toni [name] agreed to give [name] permission to discuss obesity behavioral modification therapy today for diabetes prevention.   ASSESS: Toni [name] has the diagnosis  of obesity and her BMI today is 42.35 Toni [name] is in the action stage of change   ADVISE: Toni [name] was educated on the multiple health risks of obesity as well as the benefit of weight loss to improve her health. She was advised of the need for long term treatment and the importance of lifestyle modifications.  AGREE: Multiple dietary modification options and treatment options were discussed and  Toni [name] agreed to follow the Category 2 plan We discussed the following Behavioral Modification Stratagies today: increasing lean protein intake and work on meal planning and easy cooking plans.

## 2017-06-09 DIAGNOSIS — E119 Type 2 diabetes mellitus without complications: Secondary | ICD-10-CM | POA: Diagnosis not present

## 2017-06-09 DIAGNOSIS — I110 Hypertensive heart disease with heart failure: Secondary | ICD-10-CM | POA: Diagnosis not present

## 2017-06-09 DIAGNOSIS — I509 Heart failure, unspecified: Secondary | ICD-10-CM | POA: Diagnosis not present

## 2017-06-09 DIAGNOSIS — M48062 Spinal stenosis, lumbar region with neurogenic claudication: Secondary | ICD-10-CM | POA: Diagnosis not present

## 2017-06-09 DIAGNOSIS — I480 Paroxysmal atrial fibrillation: Secondary | ICD-10-CM | POA: Diagnosis not present

## 2017-06-09 DIAGNOSIS — G35 Multiple sclerosis: Secondary | ICD-10-CM | POA: Diagnosis not present

## 2017-06-11 ENCOUNTER — Ambulatory Visit (INDEPENDENT_AMBULATORY_CARE_PROVIDER_SITE_OTHER): Payer: Medicare Other | Admitting: Family Medicine

## 2017-06-11 VITALS — BP 104/74 | HR 73 | Temp 98.3°F | Ht 63.0 in | Wt 241.0 lb

## 2017-06-11 DIAGNOSIS — F32A Depression, unspecified: Secondary | ICD-10-CM | POA: Insufficient documentation

## 2017-06-11 DIAGNOSIS — G4709 Other insomnia: Secondary | ICD-10-CM | POA: Diagnosis not present

## 2017-06-11 DIAGNOSIS — F3289 Other specified depressive episodes: Secondary | ICD-10-CM

## 2017-06-11 DIAGNOSIS — Z6841 Body Mass Index (BMI) 40.0 and over, adult: Secondary | ICD-10-CM | POA: Diagnosis not present

## 2017-06-11 DIAGNOSIS — F329 Major depressive disorder, single episode, unspecified: Secondary | ICD-10-CM | POA: Insufficient documentation

## 2017-06-11 DIAGNOSIS — F418 Other specified anxiety disorders: Secondary | ICD-10-CM | POA: Insufficient documentation

## 2017-06-11 MED ORDER — BUPROPION HCL ER (SR) 150 MG PO TB12: 150.0000 mg | ORAL_TABLET | Freq: Two times a day (BID) | ORAL | 0 refills | Status: DC

## 2017-06-11 NOTE — Progress Notes (Signed)
Office: [phone]  /  Fax: [phone]   HPI:   Chief Complaint: OBESITY Toni [name] is here to discuss her progress with her obesity treatment plan. She is on the Category 2 plan and is following her eating plan approximately 100 % of the time. She states she is exercising 0 minutes 0 times per week. Toni [name] is working on [employer] protein and vegetables. Toni [name] is retaining some fluid today. She is creative with her options. Her weight is 241 lb (109.3 kg) today and has had a weight gain of 2 pounds over a period of 4 weeks since her last visit. She has lost 16 lbs since starting treatment with [name].  Insomnia Toni [name] struggled to fall asleep recently with power outages and not being able to  Use her CPAP. She is back to using the CPAP but still having insomnia issues.  Depression with emotional eating behaviors Toni [name] is on Wellbutrin XL for depression but she is still struggling with emotional eating  That is worse in the pm. Toni [name] struggles with emotional eating and using food for comfort to the extent that it is negatively impacting her health. She often snacks when she is not hungry. Toni [name] sometimes feels she is out of control and then feels guilty that she made poor food choices. She has been working on behavior modification techniques to help reduce her emotional eating and has been somewhat successful. She shows no sign of suicidal or homicidal ideations.  Depression screen [facility] 2/9 03/08/2017 04/23/2014  Decreased Interest 0 2  Down, Depressed, Hopeless 1 2  PHQ - 2 Score 1 4  Altered sleeping 2 2  Tired, decreased energy 2 2  Change in appetite 1 1  Feeling bad or failure about yourself  0 2  Trouble concentrating 0 0  Moving slowly or fidgety/restless 0 1  Suicidal thoughts 0 1  PHQ-9 Score 6 13  Some recent data might be hidden      ALLERGIES: Allergies  Allergen Reactions  . Hydrocodone Nausea And Vomiting    Hydrocodone causes vomiting  .  Oxycodone Nausea And Vomiting    Oral oxycodone causes vomiting  . Chlorhexidine Gluconate Rash    Sores at site  . Zoloft [Sertraline] Hives, Swelling and Rash    MEDICATIONS: Current Outpatient Prescriptions on File Prior to Visit  Medication Sig Dispense Refill  . acetaminophen (TYLENOL) 500 MG tablet Take 500-1,000 mg by mouth every 6 (six) hours as needed for fever or headache (or pain).    Marland Kitchen ALPRAZolam (XANAX) 0.5 MG tablet Take 1 tablet (0.5 mg total) by mouth 2 (two) times daily as needed for anxiety. 20 tablet 0  . baclofen (LIORESAL) 20 MG tablet Take 1 tablet (20 mg total) by mouth 3 (three) times daily as needed for muscle spasms. (Patient taking differently: Take 20 mg by mouth See admin instructions. Once to twice a day for breakthrough spasms) 180 each 3  . CALCIUM CITRATE PO Take 2 tablets by mouth 2 (two) times daily.    . cetirizine (ZYRTEC) 10 MG tablet Take 10 mg by mouth daily.    . Cholecalciferol (VITAMIN D) 2000 units tablet Take 1 tablet (2,000 Units total) by mouth 2 (two) times daily.  0  . co-enzyme Q-10 30 MG capsule Take 30 mg by mouth daily.    . corticotropin (ATHACAR H.P.) 80 UNIT/ML injectable gel Inject 80 units subcutaneously once every other day  for a total of 10 days , repeat  every 30 days. 5  mL 5  . diltiazem (CARDIZEM CD) 120 MG 24 hr capsule TAKE 1 CAPSULE BY MOUTH  DAILY 90 capsule 2  . docusate sodium (COLACE) 100 MG capsule Take 1 capsule (100 mg total) by mouth daily. 10 capsule 0  . fluticasone (FLONASE) 50 MCG/ACT nasal spray Place 2 sprays into both nostrils daily as needed for allergies.     . furosemide (LASIX) 40 MG tablet Take 2 tablets (37m) by mouth in the morning and 1 tablet (451m in the afternoon    . gabapentin (NEURONTIN) 300 MG capsule TAKE 1 CAPSULE BY MOUTH UP  TO 4 TIMES DAILY (Patient taking differently: TAKE 1 CAPSULE BY MOUTH UP  TO 3 times daily) 360 capsule 3  . GENERLAC 10 GM/15ML SOLN TAKE 7.5 ML BY MOUTH TWICE DAILY 900  mL 5  . Interferon Beta-1b (BETASERON/EXTAVIA) 0.3 MG KIT injection Every other day , subcutaneus. Please titrate pt based on normal titration schedule. 1 each 12  . levothyroxine (SYNTHROID, LEVOTHROID) 75 MCG tablet Take 75 mcg by mouth daily.    . Multiple Vitamins-Minerals (CENTRUM SILVER PO) Take 1 tablet by mouth daily.    . Nebivolol HCl (BYSTOLIC) 20 MG TABS Take 20 mg by mouth daily.    . Omega-3 Fatty Acids (FISH OIL) 1000 MG CAPS Take 1,000 mg by mouth daily.    . Marland Kitchenmeprazole (PRILOSEC) 20 MG capsule Take 20 mg by mouth daily.     . psyllium (METAMUCIL SMOOTH TEXTURE) 28 % packet Take 1 packet by mouth every other day.    . ranitidine (ZANTAC) 150 MG tablet Take 150 mg by mouth at bedtime.     . simvastatin (ZOCOR) 20 MG tablet Take 10 mg by mouth at bedtime.     . SYRINGE/NEEDLE, DISP, 1 ML (BD LUER-LOK SYRINGE) 25G X 5/8" 1 ML MISC Use as directed to inject  Acthar 10 each prn  . Tamsulosin HCl (FLOMAX) 0.4 MG CAPS Take 0.4 mg by mouth daily.    . Marland KitchenNABLE TO FIND Baclofen pump: Continuous    . warfarin (COUMADIN) 5 MG tablet TAKE 1/2 TO 1 TABLET BY  MOUTH DAILY AS DIRECTED BY  COUMADIN CLINIC. 90 tablet 0   Current Facility-Administered Medications on File Prior to Visit  Medication Dose Route Frequency Provider Last Rate Last Dose  . baclofen (LIORESAL) intrathecal injection 40 mg/2028m40 mg Intrathecal Once WilKathrynn DuckingD        PAST MEDICAL HISTORY: Past Medical History:  Diagnosis Date  . Abnormality of gait 03/01/2015  . Anxiety   . Arthritis    "knees" (01/19/2016)  . Atrial fibrillation with RVR ([facility]/31/2017  . Back pain   . Chest pain   . Childhood asthma   . Chronic pain    "nerve pain from the MS"  . Constipation   . Depression   . DVT (deep venous thrombosis) ([id number]   "RLE; may have just been phlebitis; it was before the age of dopplers"  . 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 GERD  (gastroesophageal reflux disease)   . History of stress test 06/21/10   limited exam with some degree of breast attenuatin of the apex however a component of apical ischemia is not excluded, EF 62%; cardiac cath   . HLD (hyperlipidemia)   . Hx of echocardiogram 04/22/10   EF 55-60%, no valve issues  . Hypertension   .  Hypothyroidism   . Joint pain   . Migraine    "none since I stopped beta blocker in 1990s" (01/19/2016)  . Multiple sclerosis ([place])    has had this may 1989-[place] reg doc  . OSA on CPAP   . Osteoarthritis   . PAF (paroxysmal atrial fibrillation) ([place])    on coumadin; documented on monitor 06/2010  . Pneumonia 11/2011  . Shortness of breath   . Thyroid disease     PAST SURGICAL HISTORY: Past Surgical History:  Procedure Laterality Date  . ANTERIOR CERVICAL DECOMP/DISCECTOMY FUSION  01/2004  . BACK SURGERY    . CARDIAC CATHETERIZATION  06/22/2010   normal coronary arteries, PAF  . CATARACT EXTRACTION, BILATERAL Bilateral   . CHILD BIRTHS     X2  . DILATION AND CURETTAGE OF UTERUS    . INFUSION PUMP IMPLANTATION  ~ 2009   baclofen infusion in lower abd  . PAIN PUMP REVISION N/A 07/29/2014   Procedure: Baclofen pump replacement;  Surgeon: Erline Levine, MD;  Location: [place] [facility];  Service: Neurosurgery;  Laterality: N/A;  Baclofen pump replacement  . TONSILLECTOMY AND ADENOIDECTOMY    . VARICOSE VEIN SURGERY  ~ 1968  . VENOUS ABLATION  12/16/2010   radiofreq ablation -Dr Elisabeth Cara and [facility]  . WISDOM TOOTH EXTRACTION      SOCIAL HISTORY: Social History  Substance Use Topics  . Smoking status: Former Smoker    Packs/day: 0.50    Years: 7.00    Types: Cigarettes    Quit date: 08/21/1976  . Smokeless tobacco: Never Used  . Alcohol use 0.6 oz/week    1 Glasses of wine per week    FAMILY HISTORY: Family History  Problem Relation Age of Onset  . Coronary artery disease Father        at age 82  . Hyperlipidemia Father   . Thyroid disease Father   . Coronary  artery disease Maternal Grandmother   . Depression Maternal Grandmother   . Cancer Paternal Grandmother   . Depression Mother   . Sudden death Mother   . Anxiety disorder Mother   . Alcoholism Son     ROS: Review of Systems  Constitutional: Negative for weight loss.  Psychiatric/Behavioral: Positive for depression. Negative for suicidal ideas. The patient has insomnia.     PHYSICAL EXAM: Blood pressure 104/74, pulse 73, temperature 98.3 F (36.8 C), temperature source Oral, height 5' 3" (1.6 m), weight 241 lb (109.3 kg), SpO2 97 %. Body mass index is 42.69 kg/m. Physical Exam  Constitutional: She is oriented to person, place, and time. She appears well-developed and well-nourished.  Cardiovascular: Normal rate.   Pulmonary/Chest: Effort normal.  Musculoskeletal: Normal range of motion.  Neurological: She is oriented to person, place, and time.  Skin: Skin is warm and dry.  Psychiatric: She has a normal mood and affect. Her behavior is normal.  Vitals reviewed.   RECENT LABS AND TESTS: BMET    Component Value Date/Time   NA 138 04/05/2017 1450   K 4.2 04/05/2017 1450   CL 97 04/05/2017 1450   CO2 25 04/05/2017 1450   GLUCOSE 113 (H) 04/05/2017 1450   GLUCOSE 155 (H) 12/27/2016 0410   BUN 27 04/05/2017 1450   CREATININE 0.73 04/05/2017 1450   CALCIUM 9.5 04/05/2017 1450   GFRNONAA 84 04/05/2017 1450   GFRAA 97 04/05/2017 1450   Lab Results  Component Value Date   HGBA1C 5.4 03/08/2017   HGBA1C  04/21/2010    5.5 (  NOTE)                                                                       According to the ADA Clinical Practice Recommendations for 2011, when HbA1c is used as a screening test:   >=6.5%   Diagnostic of Diabetes Mellitus           (if abnormal result  is confirmed)  5.7-6.4%   Increased risk of developing Diabetes Mellitus  References:Diagnosis and Classification of Diabetes Mellitus,Diabetes SAYT,0160,10(XNATF 1):S62-S69 and Standards of Medical Care in          Diabetes - 2011,Diabetes TDDU,2025,42  (Suppl 1):S11-S61.   Lab Results  Component Value Date   INSULIN 20.4 03/08/2017   CBC    Component Value Date/Time   WBC 6.4 03/08/2017 1152   WBC 8.7 12/27/2016 0410   RBC 4.64 03/08/2017 1152   RBC 4.31 12/27/2016 0410   HGB 14.1 03/08/2017 1152   HCT 43.0 03/08/2017 1152   PLT 174 12/27/2016 0410   PLT 219 12/18/2014 1157   MCV 93 03/08/2017 1152   MCH 30.4 03/08/2017 1152   MCH 29.5 12/27/2016 0410   MCHC 32.8 03/08/2017 1152   MCHC 33.0 12/27/2016 0410   RDW 13.3 03/08/2017 1152   LYMPHSABS 2.2 03/08/2017 1152   MONOABS 0.4 12/24/2016 1442   EOSABS 0.1 03/08/2017 1152   BASOSABS 0.0 03/08/2017 1152   Iron/TIBC/Ferritin/ %Sat No results found for: IRON, TIBC, FERRITIN, IRONPCTSAT Lipid Panel     Component Value Date/Time   CHOL 143 03/08/2017 1152   TRIG 89 03/08/2017 1152   HDL 57 03/08/2017 1152   CHOLHDL 3.0 04/22/2010 0536   VLDL 29 04/22/2010 0536   LDLCALC 68 03/08/2017 1152   Hepatic Function Panel     Component Value Date/Time   PROT 6.0 04/05/2017 1450   ALBUMIN 3.9 04/05/2017 1450   AST 17 04/05/2017 1450   ALT 18 04/05/2017 1450   ALKPHOS 56 04/05/2017 1450   BILITOT 0.2 04/05/2017 1450      Component Value Date/Time   TSH 2.410 03/08/2017 1152   TSH 1.083 08/06/2016 0647   TSH 1.871 01/19/2016 2302   TSH 0.206 (L) 03/10/2011 1351   TSH 1.636 06/18/2010 1430    ASSESSMENT AND PLAN: Other insomnia  Other depression - with emotional eating  Class 3 severe obesity with serious comorbidity and body mass index (BMI) of 40.0 to 44.9 in adult, unspecified obesity type ([place])  PLAN:  Insomnia Samuella was encouraged to take her OTC melatonin 5 mg qhs approximately 1 to 2 hours before bedtime. Aubriel agrees to follow up with our clinic in 4 weeks.  Depression with Emotional Eating Behaviors We discussed behavior modification techniques today to help Shantay deal with her emotional  eating and depression. She has agreed to change Wellbutrin from XL to SR 150 mg bid #60 with no refills and follow up as directed.  Obesity Demyah is currently in the action stage of change. As such, her goal is to continue with weight loss efforts She has agreed to follow the Category 2 plan Rona has been instructed to work up to a goal of 150 minutes of combined cardio and strengthening exercise per week for weight loss and  overall health benefits. We discussed the following Behavioral Modification Strategies today: keeping healthy foods in the home, increasing lean protein intake, decreasing simple carbohydrates and holiday eating strategies   Kandi has agreed to follow up with our clinic in 4 weeks and with our registered dietician in 2 weeks. She was informed of the importance of frequent follow up visits to maximize her success with intensive lifestyle modifications for her multiple health conditions.  I, Doreene Nest, am acting as transcriptionist for Dennard Nip, MD  I have reviewed the above documentation for accuracy and completeness, and I agree with the above. -Dennard Nip, MD    OBESITY BEHAVIORAL INTERVENTION VISIT  Today's visit was # 6 out of 22.  Starting weight: 257 lbs Starting date: 03/08/17 Today's weight : 241 lbs  Today's date: 06/11/2017 Total lbs lost to date: 16 (Patients must lose 7 lbs in the first 6 months to continue with counseling)   ASK: We discussed the diagnosis of obesity with Toni [name] today and Toni [name] agreed to give [name] permission to discuss obesity behavioral modification therapy today.  ASSESS: Toni [name] has the diagnosis of obesity and her BMI today is 42.7 Toni [name] is in the action stage of change   ADVISE: Toni [name] was educated on the multiple health risks of obesity as well as the benefit of weight loss to improve her health. She was advised of the need for long term treatment and the importance of lifestyle  modifications.  AGREE: Multiple dietary modification options and treatment options were discussed and  Toni [name] agreed to follow the Category 2 plan We discussed the following Behavioral Modification Strategies today: keeping healthy foods in the home, increasing lean protein intake, decreasing simple carbohydrates and holiday eating strategies

## 2017-06-12 DIAGNOSIS — I480 Paroxysmal atrial fibrillation: Secondary | ICD-10-CM | POA: Diagnosis not present

## 2017-06-12 DIAGNOSIS — I509 Heart failure, unspecified: Secondary | ICD-10-CM | POA: Diagnosis not present

## 2017-06-12 DIAGNOSIS — E119 Type 2 diabetes mellitus without complications: Secondary | ICD-10-CM | POA: Diagnosis not present

## 2017-06-12 DIAGNOSIS — I110 Hypertensive heart disease with heart failure: Secondary | ICD-10-CM | POA: Diagnosis not present

## 2017-06-12 DIAGNOSIS — G35 Multiple sclerosis: Secondary | ICD-10-CM | POA: Diagnosis not present

## 2017-06-12 DIAGNOSIS — M48062 Spinal stenosis, lumbar region with neurogenic claudication: Secondary | ICD-10-CM | POA: Diagnosis not present

## 2017-06-13 ENCOUNTER — Telehealth: Payer: Self-pay | Admitting: Neurology

## 2017-06-13 NOTE — Telephone Encounter (Signed)
Received a notice today that [facility] has made several attempts to contact the patient to fill her Acthar HP GEL 80 unit/ml inj.

## 2017-06-18 DIAGNOSIS — G35 Multiple sclerosis: Secondary | ICD-10-CM | POA: Diagnosis not present

## 2017-06-18 DIAGNOSIS — M48062 Spinal stenosis, lumbar region with neurogenic claudication: Secondary | ICD-10-CM | POA: Diagnosis not present

## 2017-06-18 DIAGNOSIS — I110 Hypertensive heart disease with heart failure: Secondary | ICD-10-CM | POA: Diagnosis not present

## 2017-06-18 DIAGNOSIS — E119 Type 2 diabetes mellitus without complications: Secondary | ICD-10-CM | POA: Diagnosis not present

## 2017-06-18 DIAGNOSIS — I509 Heart failure, unspecified: Secondary | ICD-10-CM | POA: Diagnosis not present

## 2017-06-18 DIAGNOSIS — I480 Paroxysmal atrial fibrillation: Secondary | ICD-10-CM | POA: Diagnosis not present

## 2017-06-19 ENCOUNTER — Ambulatory Visit (INDEPENDENT_AMBULATORY_CARE_PROVIDER_SITE_OTHER): Payer: Medicare Other | Admitting: Pharmacist

## 2017-06-19 DIAGNOSIS — I4821 Permanent atrial fibrillation: Secondary | ICD-10-CM

## 2017-06-19 DIAGNOSIS — Z7901 Long term (current) use of anticoagulants: Secondary | ICD-10-CM | POA: Diagnosis not present

## 2017-06-19 DIAGNOSIS — I482 Chronic atrial fibrillation: Secondary | ICD-10-CM

## 2017-06-19 LAB — POCT INR: INR: 1.9

## 2017-06-25 ENCOUNTER — Ambulatory Visit (INDEPENDENT_AMBULATORY_CARE_PROVIDER_SITE_OTHER): Payer: Medicare Other | Admitting: Dietician

## 2017-06-25 VITALS — Ht 63.0 in | Wt 238.0 lb

## 2017-06-25 DIAGNOSIS — Z6841 Body Mass Index (BMI) 40.0 and over, adult: Secondary | ICD-10-CM | POA: Diagnosis not present

## 2017-06-25 NOTE — Progress Notes (Signed)
  Office: [phone]  /  Fax: [phone]  OBESITY BEHAVIORAL INTERVENTION VISIT  Today's visit was # 7 out of 22.  Starting weight: 257 lbs Starting date: 03/08/17 Today's weight : Weight: 238 lb (108 kg)  Today's date: 06/25/2017 Total lbs lost to date: 19 lbs (Patients must lose 7 lbs in the first 6 months to continue with counseling)   ASK: We discussed the diagnosis of obesity with [name] today and Dori agreed to give [name] permission to discuss obesity behavioral modification therapy today.   Daisia is following her category 2 meal plan 100% of the time. She is making appropriate substitutions consistent with her meal plan however is getting somewhat bored with her lunch meal. She was given intensive nutrition education today regarding keeping a food record with the application [employer] to be used with her tablet/computer. Her husband has been very supportive with her weight loss treatment plan. Her exercise is limited due to her MS.   ASSESS: Avneet has the diagnosis of obesity and her BMI today is 42.17 [place] is in the action stage of change   ADVISE: Tannie was educated on the multiple health risks of obesity as well as the benefit of weight loss to improve her health. She was advised of the need for long term treatment and the importance of lifestyle modifications.  AGREE: Multiple dietary modification options and treatment options were discussed and  Avneet agreed to follow the Category 2 plan and food journaling for lunch, 350-450 calories and 30+ grams of protein.  We discussed the following Behavioral Modification Stratagies today: increasing lean protein intake and work on meal planning and easy cooking plans, keeping a Museum/gallery conservator.

## 2017-06-26 ENCOUNTER — Encounter: Payer: Self-pay | Admitting: Neurology

## 2017-06-26 ENCOUNTER — Ambulatory Visit (INDEPENDENT_AMBULATORY_CARE_PROVIDER_SITE_OTHER): Payer: Medicare Other | Admitting: Neurology

## 2017-06-26 VITALS — BP 138/83 | HR 83 | Ht 63.0 in

## 2017-06-26 DIAGNOSIS — G35 Multiple sclerosis: Secondary | ICD-10-CM

## 2017-06-26 DIAGNOSIS — M62838 Other muscle spasm: Secondary | ICD-10-CM

## 2017-06-26 MED ORDER — BACLOFEN 40 MG/20ML IT SOLN
80.0000 mg | Freq: Once | INTRATHECAL | Status: AC
  Administered 2017-06-26: 80 mg via INTRATHECAL

## 2017-06-26 NOTE — Progress Notes (Signed)
Please refer to the baclofen pump refill note.

## 2017-06-26 NOTE — Procedures (Signed)
     History:  Toni [name] is a 71 year old patient with a history of multiple sclerosis with a paraparesis.  The patient indicates that she is able to ambulate better if she has some spasticity in the legs.  She wishes to lower the baclofen dosing on the baclofen pump.  Otherwise, she has done well.  Baclofen pump refill note  The baclofen pump site was cleaned with Betadine solution. A 21-gauge needle was inserted into the pump port site. Approximately 3 cc of residual baclofen was removed, the pump indicates a 2.7 cc residual. 40 cc of replacement baclofen was placed into the pump at 2000 mcg/cc concentration.  The pump was reprogrammed for the following settings: The patient was converted back to a simple continuous rate with 681.0 mcg/day.  The patient had been getting a total of 712.1 mcg/day with a basal rate of 28 mcg/h, the patient was given boluses that 8 PM through 5 AM receiving 30 mcg/h.  The alarm volume is set at 1.5 cc. The next alarm date is October 17, 2017.  The patient tolerated the procedure well. There were no complications of the above procedure.  The [facility] number is [phone] The baclofen expiration date is May 21, 2019. The baclofen lot number is [id number].

## 2017-06-28 DIAGNOSIS — E119 Type 2 diabetes mellitus without complications: Secondary | ICD-10-CM | POA: Diagnosis not present

## 2017-06-28 DIAGNOSIS — I110 Hypertensive heart disease with heart failure: Secondary | ICD-10-CM | POA: Diagnosis not present

## 2017-06-28 DIAGNOSIS — G35 Multiple sclerosis: Secondary | ICD-10-CM | POA: Diagnosis not present

## 2017-06-28 DIAGNOSIS — I480 Paroxysmal atrial fibrillation: Secondary | ICD-10-CM | POA: Diagnosis not present

## 2017-06-28 DIAGNOSIS — I509 Heart failure, unspecified: Secondary | ICD-10-CM | POA: Diagnosis not present

## 2017-06-28 DIAGNOSIS — M48062 Spinal stenosis, lumbar region with neurogenic claudication: Secondary | ICD-10-CM | POA: Diagnosis not present

## 2017-07-05 DIAGNOSIS — G35 Multiple sclerosis: Secondary | ICD-10-CM | POA: Diagnosis not present

## 2017-07-05 DIAGNOSIS — I480 Paroxysmal atrial fibrillation: Secondary | ICD-10-CM | POA: Diagnosis not present

## 2017-07-05 DIAGNOSIS — I110 Hypertensive heart disease with heart failure: Secondary | ICD-10-CM | POA: Diagnosis not present

## 2017-07-05 DIAGNOSIS — M48062 Spinal stenosis, lumbar region with neurogenic claudication: Secondary | ICD-10-CM | POA: Diagnosis not present

## 2017-07-05 DIAGNOSIS — E119 Type 2 diabetes mellitus without complications: Secondary | ICD-10-CM | POA: Diagnosis not present

## 2017-07-05 DIAGNOSIS — I509 Heart failure, unspecified: Secondary | ICD-10-CM | POA: Diagnosis not present

## 2017-07-08 ENCOUNTER — Emergency Department (HOSPITAL_COMMUNITY): Payer: Medicare Other

## 2017-07-08 ENCOUNTER — Emergency Department (HOSPITAL_COMMUNITY)
Admission: EM | Admit: 2017-07-08 | Discharge: 2017-07-08 | Disposition: A | Discharge status: Home / Self Care | Payer: Medicare Other | Attending: Emergency Medicine | Admitting: Emergency Medicine

## 2017-07-08 DIAGNOSIS — J069 Acute upper respiratory infection, unspecified: Secondary | ICD-10-CM | POA: Diagnosis not present

## 2017-07-08 DIAGNOSIS — J40 Bronchitis, not specified as acute or chronic: Secondary | ICD-10-CM | POA: Diagnosis not present

## 2017-07-08 DIAGNOSIS — I10 Essential (primary) hypertension: Secondary | ICD-10-CM | POA: Insufficient documentation

## 2017-07-08 DIAGNOSIS — R0602 Shortness of breath: Secondary | ICD-10-CM | POA: Diagnosis not present

## 2017-07-08 DIAGNOSIS — E039 Hypothyroidism, unspecified: Secondary | ICD-10-CM | POA: Diagnosis not present

## 2017-07-08 DIAGNOSIS — Z87891 Personal history of nicotine dependence: Secondary | ICD-10-CM | POA: Diagnosis not present

## 2017-07-08 DIAGNOSIS — Z7901 Long term (current) use of anticoagulants: Secondary | ICD-10-CM | POA: Insufficient documentation

## 2017-07-08 DIAGNOSIS — Z79899 Other long term (current) drug therapy: Secondary | ICD-10-CM | POA: Diagnosis not present

## 2017-07-08 DIAGNOSIS — J209 Acute bronchitis, unspecified: Secondary | ICD-10-CM | POA: Insufficient documentation

## 2017-07-08 DIAGNOSIS — R911 Solitary pulmonary nodule: Secondary | ICD-10-CM | POA: Diagnosis not present

## 2017-07-08 LAB — CBC WITH DIFFERENTIAL/PLATELET
Basophils Absolute: 0 10*3/uL (ref 0.0–0.1)
Basophils Relative: 0 %
EOS ABS: 0.2 10*3/uL (ref 0.0–0.7)
Eosinophils Relative: 3 %
HCT: 45 % (ref 36.0–46.0)
Hemoglobin: 15.2 g/dL — ABNORMAL HIGH (ref 12.0–15.0)
LYMPHS ABS: 1.6 10*3/uL (ref 0.7–4.0)
Lymphocytes Relative: 19 %
MCH: 31.5 pg (ref 26.0–34.0)
MCHC: 33.8 g/dL (ref 30.0–36.0)
MCV: 93.2 fL (ref 78.0–100.0)
Monocytes Absolute: 1.3 10*3/uL — ABNORMAL HIGH (ref 0.1–1.0)
Monocytes Relative: 16 %
NEUTROS ABS: 5.2 10*3/uL (ref 1.7–7.7)
NEUTROS PCT: 62 %
PLATELETS: 173 10*3/uL (ref 150–400)
RBC: 4.83 MIL/uL (ref 3.87–5.11)
RDW: 13.3 % (ref 11.5–15.5)
WBC: 8.4 10*3/uL (ref 4.0–10.5)

## 2017-07-08 LAB — COMPREHENSIVE METABOLIC PANEL
ALBUMIN: 3.8 g/dL (ref 3.5–5.0)
ALT: 23 U/L (ref 14–54)
ANION GAP: 9 (ref 5–15)
AST: 21 U/L (ref 15–41)
Alkaline Phosphatase: 55 U/L (ref 38–126)
BUN: 22 mg/dL — ABNORMAL HIGH (ref 6–20)
CHLORIDE: 99 mmol/L — AB (ref 101–111)
CO2: 31 mmol/L (ref 22–32)
Calcium: 9.3 mg/dL (ref 8.9–10.3)
Creatinine, Ser: 0.67 mg/dL (ref 0.44–1.00)
GFR calc Af Amer: >60 mL/min (ref >60)
GFR calc non Af Amer: >60 mL/min (ref >60)
GLUCOSE: 92 mg/dL (ref 65–99)
POTASSIUM: 3.9 mmol/L (ref 3.5–5.1)
SODIUM: 139 mmol/L (ref 135–145)
TOTAL PROTEIN: 6.7 g/dL (ref 6.5–8.1)
Total Bilirubin: 0.8 mg/dL (ref 0.3–1.2)

## 2017-07-08 LAB — PROTIME-INR
INR: 2.34
Prothrombin Time: 25.4 seconds — ABNORMAL HIGH (ref 11.4–15.2)

## 2017-07-08 MED ORDER — SODIUM CHLORIDE 0.9 % IV BOLUS (SEPSIS)
1000.0000 mL | Freq: Once | INTRAVENOUS | Status: AC
  Administered 2017-07-08: 1000 mL via INTRAVENOUS

## 2017-07-08 MED ORDER — DM-GUAIFENESIN ER 30-600 MG PO TB12: 1.0000 | ORAL_TABLET | Freq: Two times a day (BID) | ORAL | 1 refills | Status: DC

## 2017-07-08 MED ORDER — SODIUM CHLORIDE 0.9 % IV SOLN: INTRAVENOUS | Status: DC

## 2017-07-08 MED ORDER — IOPAMIDOL (ISOVUE-300) INJECTION 61%
INTRAVENOUS | Status: AC
  Filled 2017-07-08: qty 75

## 2017-07-08 MED ORDER — ALBUTEROL SULFATE HFA 108 (90 BASE) MCG/ACT IN AERS: 1.0000 | INHALATION_SPRAY | Freq: Four times a day (QID) | RESPIRATORY_TRACT | 0 refills | Status: DC | PRN

## 2017-07-08 MED ORDER — POLYETHYLENE GLYCOL 3350 17 G PO PACK: 17.0000 g | PACK | Freq: Every day | ORAL | 1 refills | Status: DC

## 2017-07-08 NOTE — ED Triage Notes (Signed)
Per EMS, pt is coming from home with complaints of SOB and it worsens with exertion. Pt denies a respiratory hx. Pt states current symptoms feel the same as last pneumonia diagnosis a year ago. Pt has been having these symptoms since Friday. Pt ambulates with a walker. Pt received 5 mg albuterol from EMS.

## 2017-07-08 NOTE — ED Notes (Signed)
Bed: TC76 Expected date: 07/08/17 Expected time: 10:45 AM Means of arrival: Ambulance Comments: [facility]

## 2017-07-08 NOTE — ED Provider Notes (Signed)
[facility] DEPT Provider Note   CSN: [id number] Arrival date & time: 07/08/17  1046     History   Chief Complaint Chief Complaint  Patient presents with  . Shortness of Breath    HPI Toni [name] is a 71 y.o. female.  Patient with complaint of cough congestion shortness of breath particularly with exertion upper respiratory symptoms since Friday.  Patient's husband had similar illness since Monday.  Patient has a history of chronic atrial fibrillation and is on Coumadin.  Patient also has a history of multiple sclerosis.  Temperature upon arrival was 98.4.  Oxygen saturations on room air were 97%.  Respiratory rate was up some at 25.  Heart rate varied anywhere from 137-90s.  Cardiac monitor was consistent with atrial fibrillation.      Past Medical History:  Diagnosis Date  . Abnormality of gait 03/01/2015  . Anxiety   . Arthritis    "knees" (01/19/2016)  . Atrial fibrillation with RVR ([place]) 01/19/2016  . Back pain   . Chest pain   . Childhood asthma   . Chronic pain    "nerve pain from the MS"  . Constipation   . Depression   . DVT (deep venous thrombosis) ([place]) 1983   "RLE; may have just been phlebitis; it was before the age of dopplers"  . 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 GERD (gastroesophageal reflux disease)   . History of stress test 06/21/10   limited exam with some degree of breast attenuatin of the apex however a component of apical ischemia is not excluded, EF 62%; cardiac cath   . HLD (hyperlipidemia)   . Hx of echocardiogram 04/22/10   EF 55-60%, no valve issues  . Hypertension   . Hypothyroidism   . Joint pain   . Migraine    "none since I stopped beta blocker in 1990s" (01/19/2016)  . Multiple sclerosis ([place])    has had this may 1989-D[facility]meir reg doc  . OSA on CPAP   . Osteoarthritis   . PAF (paroxysmal atrial fibrillation) ([place])    on coumadin; documented  on monitor 06/2010  . Pneumonia 11/2011  . Shortness of breath   . Thyroid disease     Patient Active Problem List   Diagnosis Date Noted  . Other insomnia 06/11/2017  . Depression 06/11/2017  . Abdominal swelling 03/29/2017  . Central apnea 03/26/2017  . Class 3 obesity with serious comorbidity and body mass index (BMI) of 40.0 to 44.9 in adult 03/22/2017  . Vitamin D deficiency 03/22/2017  . Prediabetes 03/22/2017  . Venous insufficiency 02/14/2017  . Right hand weakness 08/06/2016  . Nonischemic cardiomyopathy ([place]) 05/09/2016  . Localized edema 05/09/2016  . Long term current use of anticoagulant therapy 02/03/2016  . 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 Atrial fibrillation with RVR ([place]) 01/19/2016  . 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 Obesity hypoventilation syndrome ([place]) 03/30/2015  . Abnormality of gait 03/01/2015  . Chronic constipation with overflow incontinence 01/25/2015  . 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) ([place]) 01/25/2015  . 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 Closed right ankle fracture 10/30/2013  . Leucocytosis 10/30/2013  . Varicose veins of both lower extremities with complications 29/56/2130  . OSA on CPAP 06/04/2013  . Muscle spasms  of lower extremity 02/24/2013  . Long term (current) use of anticoagulants 11/05/2012  . 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 Aspiration pneumonia vs Failed outpatient Rx of CAP 11/26/2011  . Hypertension   . Thyroid disease   . HLD (hyperlipidemia)     Past Surgical History:  Procedure Laterality Date  . ANTERIOR CERVICAL DECOMP/DISCECTOMY FUSION  01/2004  . BACK SURGERY    . Baclofen pump replacement N/A 07/29/2014   Performed by Erline Levine, MD at [facility] NEURO ORS  . CARDIAC CATHETERIZATION  06/22/2010    normal coronary arteries, PAF  . CATARACT EXTRACTION, BILATERAL Bilateral   . CHILD BIRTHS     X2  . DILATION AND CURETTAGE OF UTERUS    . INFUSION PUMP IMPLANTATION  ~ 2009   baclofen infusion in lower abd  . TONSILLECTOMY AND ADENOIDECTOMY    . VARICOSE VEIN SURGERY  ~ 1968  . VENOUS ABLATION  12/16/2010   radiofreq ablation -Dr Elisabeth Cara and [facility]  . WISDOM TOOTH EXTRACTION      OB History    Gravida Para Term Preterm AB Living   2             SAB TAB Ectopic Multiple Live Births                   Home Medications    Prior to Admission medications   Medication Sig Start Date End Date Taking? Authorizing Provider  acetaminophen (TYLENOL) 500 MG tablet Take 500-1,000 mg by mouth every 6 (six) hours as needed for fever or headache (or pain).   Yes [provider]  ALPRAZolam (XANAX) 0.5 MG tablet Take 1 tablet (0.5 mg total) by mouth 2 (two) times daily as needed for anxiety. 03/26/17  Yes Dohmeier, Asencion Partridge, MD  baclofen (LIORESAL) 20 MG tablet Take 1 tablet (20 mg total) by mouth 3 (three) times daily as needed for muscle spasms. Patient taking differently: Take 20 mg by mouth See admin instructions. Once to twice a day for breakthrough spasms 05/04/16  Yes Dohmeier, Asencion Partridge, MD  Biotin 10000 MCG TABS Take 1,000 mcg by mouth daily.   Yes [provider]  buPROPion (WELLBUTRIN SR) 150 MG 12 hr tablet Take 1 tablet (150 mg total) by mouth 2 (two) times daily. 06/11/17  Yes Beasley, Caren D, MD  CALCIUM CITRATE PO Take 2 tablets by mouth 2 (two) times daily.   Yes [provider]  cetirizine (ZYRTEC) 10 MG tablet Take 10 mg by mouth daily.   Yes [provider]  Cholecalciferol (VITAMIN D) 2000 units tablet Take 1 tablet (2,000 Units total) by mouth 2 (two) times daily. 03/22/17  Yes Beasley, Caren D, MD  co-enzyme Q-10 30 MG capsule Take 30 mg by mouth daily.   Yes [provider]  corticotropin (ATHACAR H.P.) 80 UNIT/ML injectable gel Inject 80  units subcutaneously once every other day  for a total of 10 days , repeat  every 30 days. 03/14/17  Yes Dohmeier, Asencion Partridge, MD  diltiazem (CARDIZEM CD) 120 MG 24 hr capsule TAKE 1 CAPSULE BY MOUTH  DAILY 04/09/17  Yes Hilty, Nadean Corwin, MD  docusate sodium (COLACE) 100 MG capsule Take 1 capsule (100 mg total) by mouth daily. 08/12/16  Yes Rai, Ripudeep K, MD  fluticasone (FLONASE) 50 MCG/ACT nasal spray Place 2 sprays into both nostrils daily as needed for allergies.  03/23/15  Yes [provider]  furosemide (LASIX) 40 MG tablet Take 2 tablets (  22m) by mouth in the morning and 1 tablet (410m in the afternoon   Yes [provider]  gabapentin (NEURONTIN) 300 MG capsule TAKE 1 CAPSULE BY MOUTH UP  TO 4 TIMES DAILY 12/13/16  Yes Dohmeier, CaAsencion PartridgeMD  [facility] GM/15ML SOLN TAKE 7.5 ML BY MOUTH TWICE DAILY 06/20/16  Yes WiKathrynn DuckingMD  Interferon Beta-1b (BETASERON/EXTAVIA) 0.3 MG KIT injection Every other day , subcutaneus. Please titrate pt based on normal titration schedule. 10/05/16  Yes Dohmeier, CaAsencion PartridgeMD  levothyroxine (SYNTHROID, LEVOTHROID) 75 MCG tablet Take 75 mcg by mouth daily.   Yes [provider]  Multiple Vitamins-Minerals (CENTRUM SILVER PO) Take 1 tablet by mouth daily.   Yes [provider]  Nebivolol HCl (BYSTOLIC) 20 MG TABS Take 20 mg by mouth daily.   Yes [provider]  Omega-3 Fatty Acids (FISH OIL) 1000 MG CAPS Take 1,000 mg by mouth daily.   Yes [provider]  omeprazole (PRILOSEC) 20 MG capsule Take 20 mg by mouth daily.    Yes [provider]  psyllium (METAMUCIL SMOOTH TEXTURE) 28 % packet Take 1 packet by mouth every other day.   Yes [provider]  ranitidine (ZANTAC) 150 MG tablet Take 150 mg by mouth at bedtime.  12/22/12  Yes [provider]  simvastatin (ZOCOR) 20 MG tablet Take 10 mg by mouth at bedtime.    Yes [provider]  Tamsulosin HCl (FLOMAX) 0.4 MG CAPS Take 0.4  mg by mouth daily.   Yes [provider]  UNABLE TO FIND Baclofen pump: Continuous   Yes [provider]  warfarin (COUMADIN) 5 MG tablet TAKE 1/2 TO 1 TABLET BY  MOUTH DAILY AS DIRECTED BY  COUMADIN CLINIC. 02/19/17  Yes Hilty, KeNadean CorwinMD  albuterol (PROVENTIL HFA;VENTOLIN HFA) 108 (90 Base) MCG/ACT inhaler Inhale 1-2 puffs every 6 (six) hours as needed into the lungs for wheezing or shortness of breath. 07/08/17   ZaFredia SorrowMD  dextromethorphan-guaiFENesin (MPorter-Starke Services IncM) 30-600 MG 12hr tablet Take 1 tablet 2 (two) times daily by mouth. 07/08/17   ZaFredia SorrowMD  polyethylene glycol (MInova Fairfax Hospitalpacket Take 17 g daily by mouth. 07/08/17   ZaFredia SorrowMD  SYRINGE/NEEDLE, DISP, 1 ML (BD LUER-LOK SYRINGE) 25G X 5/8" 1 ML MISC Use as directed to inject  [place] Patient not taking: Reported on 07/08/2017 06/15/15   Dohmeier, CaAsencion PartridgeMD    Family History Family History  Problem Relation Age of Onset  . Coronary artery disease Father        at age 70. Hyperlipidemia Father   . Thyroid disease Father   . Coronary artery disease Maternal Grandmother   . Depression Maternal Grandmother   . Cancer Paternal Grandmother   . Depression Mother   . Sudden death Mother   . Anxiety disorder Mother   . Alcoholism Son     Social History Social History   Tobacco Use  . Smoking status: Former Smoker    Packs/day: 0.50    Years: 7.00    Pack years: 3.50    Types: Cigarettes    Last attempt to quit: 08/21/1976    Years since quitting: 40.9  . Smokeless tobacco: Never Used  Substance Use Topics  . Alcohol use: Yes    Alcohol/week: 0.6 oz    Types: 1 Glasses of wine per week  . Drug use: No     Allergies   Hydrocodone; Oxycodone; Chlorhexidine gluconate; and Zoloft [sertraline]  Review of Systems Review of Systems  Constitutional: Negative for fever.  HENT: Positive for congestion.   Eyes: Negative for visual disturbance.  Respiratory: Positive for  cough and shortness of breath.   Cardiovascular: Positive for palpitations. Negative for chest pain.  Gastrointestinal: Negative for abdominal pain.  Genitourinary: Negative for dysuria.  Musculoskeletal: Positive for myalgias. Negative for back pain.  Skin: Negative for rash.  Neurological: Negative for headaches.  Hematological: Bruises/bleeds easily.  Psychiatric/Behavioral: Negative for confusion.     Physical Exam Updated Vital Signs BP 136/75 (BP Location: Left Arm)   Pulse (!) 137   Temp 98.4 F (36.9 C) (Oral)   Resp (!) 25   SpO2 97%   Physical Exam  Constitutional: She is oriented to person, place, and time. She appears well-developed and well-nourished.  Non-toxic appearance. She does not appear ill.  HENT:  Head: Normocephalic and atraumatic.  Mouth/Throat: No oropharyngeal exudate.  Mucous membranes dry  Eyes: Conjunctivae and EOM are normal.  Neck: Neck supple.  Cardiovascular:  Tachycardic irregular  Pulmonary/Chest: Effort normal and breath sounds normal. No stridor. No respiratory distress. She has no wheezes. She has no rales.  Abdominal: Soft. Bowel sounds are normal. There is no tenderness.  Musculoskeletal: Normal range of motion.  Neurological: She is alert and oriented to person, place, and time.  Skin: Skin is warm and dry.  Nursing note and vitals reviewed.    ED Treatments / Results  Labs (all labs ordered are listed, but only abnormal results are displayed) Labs Reviewed  COMPREHENSIVE METABOLIC PANEL - Abnormal; Notable for the following components:      Result Value   Chloride 99 (*)    BUN 22 (*)    All other components within normal limits  PROTIME-INR - Abnormal; Notable for the following components:   Prothrombin Time 25.4 (*)    All other components within normal limits  CBC WITH DIFFERENTIAL/PLATELET - Abnormal; Notable for the following components:   Hemoglobin 15.2 (*)    Monocytes Absolute 1.3 (*)    All other components  within normal limits    EKG  EKG Interpretation  Date/Time:  Sunday July 08 2017 11:02:42 EST Ventricular Rate:  135 PR Interval:    QRS Duration: 113 QT Interval:  337 QTC Calculation: 481 R Axis:   89 Text Interpretation:  Atrial fibrillation Borderline intraventricular conduction delay Low voltage, extremity leads Baseline wander in lead(s) V6 Confirmed by Fredia Sorrow [phone]) on 07/08/2017 11:38:11 AM       Radiology Dg Chest 2 View  Result Date: 07/08/2017 CLINICAL DATA:  Shortness of Breath EXAM: CHEST  2 VIEW COMPARISON:  12/26/2016 FINDINGS: Cardiomegaly with mild vascular congestion. No overt edema. No confluent opacities or effusions. No acute bony abnormality. IMPRESSION: Cardiomegaly, vascular congestion. Electronically Signed   By: Rolm Baptise M.D.   On: 07/08/2017 12:42   Ct Chest Wo Contrast  Result Date: 07/08/2017 CLINICAL DATA:  Shortness of breath. EXAM: CT CHEST WITHOUT CONTRAST TECHNIQUE: Multidetector CT imaging of the chest was performed following the standard protocol without IV contrast. COMPARISON:  11/26/2011 FINDINGS: Cardiovascular: Heart is enlarged. Atherosclerotic calcification is noted in the wall of the thoracic aorta. Mediastinum/Nodes: 10 mm short axis right inferior hilar lymph node is stable in the interval. No mediastinal lymphadenopathy. No evidence for gross hilar lymphadenopathy although assessment is limited by the lack of intravenous contrast on today's study. The esophagus has normal imaging features. There is no axillary lymphadenopathy. Lungs/Pleura: No focal airspace consolidation. Calcified  granuloma noted right apex. No pulmonary edema or pleural effusion. 4 mm left lower lobe pulmonary nodule stable since prior study compatible with benign process. Stable chronic atelectasis or scarring along theee left heart border. 4 mm nodule inferior lingula (image 110) is stable since prior study. Several other scattered tiny bilateral  pulmonary nodules are also similar to prior. 4 mm nodule right lower lobe on image 94 series 7 not definitely present on prior imaging. Upper Abdomen: Unremarkable. Musculoskeletal: Bone windows reveal no worrisome lytic or sclerotic osseous lesions. IMPRESSION: 1. No acute cardiopulmonary findings. 2. 4 mm right lower lobe pulmonary nodule not definitely seen on prior study. No follow-up needed if patient is low-risk. Non-contrast chest CT can be considered in 12 months if patient is high-risk. This recommendation follows the consensus statement: Guidelines for Management of Incidental Pulmonary Nodules Detected on CT Images: From the Fleischner Society 2017; Radiology 2017; 284:228-243. 3. Scattered tiny bilateral pulmonary nodules, similar to prior study and and long interval stability suggests benign etiology. Electronically Signed   By: Misty Stanley M.D.   On: 07/08/2017 13:36    Procedures Procedures (including critical care time)  Medications Ordered in ED Medications  0.9 %  sodium chloride infusion (not administered)  iopamidol (ISOVUE-300) 61 % injection (not administered)  sodium chloride 0.9 % bolus 1,000 mL (1,000 mLs Intravenous New Bag/Given 07/08/17 1217)     Initial Impression / Assessment and Plan / ED Course  I have reviewed the triage vital signs and the nursing notes.  Pertinent labs & imaging results that were available during my care of the patient were reviewed by me and considered in my medical decision making (see chart for details).    Patient workup was geared towards trying to rule out pneumonia.  Chest x-ray negative CT of chest negative for pneumonia.  Was not clinically concerned about pulmonary embolus.  Patient clearly has an upper respiratory infection almost flulike illness.  She did have her flu shot.  Oxygen saturations are fine at rest.  Patient will require symptomatic treatment with albuterol inhaler and Mucinex DM.  Patient does not have an oxygen  requirement.  Patient's husband with similar illness.  Patient will require close follow-up with her primary care doctor.  She will return for any new or worse symptoms.  Patient has a history of chronic atrial fib.  She says her heart rates normally in the low 100 here it would vary from below 100 to slightly above 100.  Do not feel that it required any direct intervention.  Patient is on Coumadin.  And her INR was therapeutic.  Patient is on a beta-blocker as well as a calcium channel blocker for treatment of this.  Felt that some of the elevated addition and the heart rate was related to her illness.  Patient was hydrated here with a liter of fluid.  Chest x-ray showed no pulmonary edema.  Patient stable for discharge home. Close follow-up will be required.  She will use an albuterol inhaler and start Mucinex DM.   Final Clinical Impressions(s) / ED Diagnoses   Final diagnoses:  Bronchitis  Viral upper respiratory tract infection    ED Discharge Orders        Ordered    albuterol (PROVENTIL HFA;VENTOLIN HFA) 108 (90 Base) MCG/ACT inhaler  Every 6 hours PRN     07/08/17 1533    dextromethorphan-guaiFENesin (MUCINEX DM) 30-600 MG 12hr tablet  2 times daily     07/08/17 1533    polyethylene  glycol [facility]) packet  Daily     07/08/17 1533       Fredia Sorrow, MD 07/08/17 1554

## 2017-07-08 NOTE — Discharge Instructions (Signed)
Use the albuterol 2 puffs every 6 hours.  Take Mucinex DM every 12 hours.  Continue with all your other current medications.  Prescription for MiraLAX provided in case of concerns for constipation.  Return for any new or worse symptoms.  Make an appointment to follow-up with your doctor.  Today's workup showed no signs of pneumonia.

## 2017-07-09 ENCOUNTER — Telehealth: Payer: Self-pay | Admitting: Neurology

## 2017-07-09 ENCOUNTER — Encounter (INDEPENDENT_AMBULATORY_CARE_PROVIDER_SITE_OTHER): Payer: Self-pay

## 2017-07-09 ENCOUNTER — Ambulatory Visit (INDEPENDENT_AMBULATORY_CARE_PROVIDER_SITE_OTHER): Payer: Medicare Other | Admitting: Pharmacist

## 2017-07-09 ENCOUNTER — Ambulatory Visit (INDEPENDENT_AMBULATORY_CARE_PROVIDER_SITE_OTHER): Payer: Medicare Other | Admitting: Family Medicine

## 2017-07-09 DIAGNOSIS — Z7901 Long term (current) use of anticoagulants: Secondary | ICD-10-CM | POA: Diagnosis not present

## 2017-07-09 NOTE — Telephone Encounter (Signed)
Pt had an appointment with Dr Brett Fairy on 11-20 but is sick.. Pt was offered Dr Dohmeier's next avail for Feb 27th, 2019 declined and asked to see NP since there has been no changes in health.  Pt has agreed to 12-03 with NP Megan.  No call back requested

## 2017-07-09 NOTE — Telephone Encounter (Signed)
[place], routed to me in error

## 2017-07-10 ENCOUNTER — Ambulatory Visit: Payer: Medicare Other | Admitting: Neurology

## 2017-07-11 DIAGNOSIS — I509 Heart failure, unspecified: Secondary | ICD-10-CM | POA: Diagnosis not present

## 2017-07-11 DIAGNOSIS — M48062 Spinal stenosis, lumbar region with neurogenic claudication: Secondary | ICD-10-CM | POA: Diagnosis not present

## 2017-07-11 DIAGNOSIS — I480 Paroxysmal atrial fibrillation: Secondary | ICD-10-CM | POA: Diagnosis not present

## 2017-07-11 DIAGNOSIS — G35 Multiple sclerosis: Secondary | ICD-10-CM | POA: Diagnosis not present

## 2017-07-11 DIAGNOSIS — E119 Type 2 diabetes mellitus without complications: Secondary | ICD-10-CM | POA: Diagnosis not present

## 2017-07-11 DIAGNOSIS — I110 Hypertensive heart disease with heart failure: Secondary | ICD-10-CM | POA: Diagnosis not present

## 2017-07-16 ENCOUNTER — Encounter: Payer: Self-pay | Admitting: Family Medicine

## 2017-07-17 DIAGNOSIS — I110 Hypertensive heart disease with heart failure: Secondary | ICD-10-CM | POA: Diagnosis not present

## 2017-07-17 DIAGNOSIS — E119 Type 2 diabetes mellitus without complications: Secondary | ICD-10-CM | POA: Diagnosis not present

## 2017-07-17 DIAGNOSIS — M48062 Spinal stenosis, lumbar region with neurogenic claudication: Secondary | ICD-10-CM | POA: Diagnosis not present

## 2017-07-17 DIAGNOSIS — G35 Multiple sclerosis: Secondary | ICD-10-CM | POA: Diagnosis not present

## 2017-07-17 DIAGNOSIS — I480 Paroxysmal atrial fibrillation: Secondary | ICD-10-CM | POA: Diagnosis not present

## 2017-07-17 DIAGNOSIS — I509 Heart failure, unspecified: Secondary | ICD-10-CM | POA: Diagnosis not present

## 2017-07-17 LAB — POCT INR: INR: 2.5

## 2017-07-18 ENCOUNTER — Ambulatory Visit (INDEPENDENT_AMBULATORY_CARE_PROVIDER_SITE_OTHER): Payer: Medicare Other | Admitting: Pharmacist

## 2017-07-18 ENCOUNTER — Ambulatory Visit (INDEPENDENT_AMBULATORY_CARE_PROVIDER_SITE_OTHER): Payer: Medicare Other | Admitting: Physician Assistant

## 2017-07-18 DIAGNOSIS — Z7901 Long term (current) use of anticoagulants: Secondary | ICD-10-CM | POA: Diagnosis not present

## 2017-07-22 ENCOUNTER — Encounter: Payer: Self-pay | Admitting: Neurology

## 2017-07-23 ENCOUNTER — Ambulatory Visit (INDEPENDENT_AMBULATORY_CARE_PROVIDER_SITE_OTHER): Payer: Medicare Other | Admitting: Adult Health

## 2017-07-23 ENCOUNTER — Encounter: Payer: Self-pay | Admitting: Adult Health

## 2017-07-23 VITALS — BP 116/68 | HR 78 | Ht 63.0 in

## 2017-07-23 DIAGNOSIS — G35 Multiple sclerosis: Secondary | ICD-10-CM | POA: Diagnosis not present

## 2017-07-23 DIAGNOSIS — G4733 Obstructive sleep apnea (adult) (pediatric): Secondary | ICD-10-CM | POA: Diagnosis not present

## 2017-07-23 MED ORDER — LACTULOSE ENCEPHALOPATHY 10 GM/15ML PO SOLN: 10.0000 g | Freq: Two times a day (BID) | ORAL | 5 refills | Status: DC | PRN

## 2017-07-23 NOTE — Progress Notes (Signed)
PATIENT: Toni [name] DOB: Apr 21, 1946  REASON FOR VISIT: follow up HISTORY FROM: patient  HISTORY OF PRESENT ILLNESS: Today 07/23/17 [name] is a 71 year old female with a history of multiple sclerosis and obstructive sleep apnea on BiPAP.   she returns today for follow-up.  Her BiPAP indicates that she use her machine 30 out of 30 days for compliance of 100%.  She use her machine greater than 4 hours 28 out of 30 days for compliance of 93%.  On average she uses her machine 7 hours and 14 minutes.  Her IPAP is 20 cm of water and EPAP pressure of 15 cm of water her residual AHI is 16.7.  She does not have a significant leak.  The patient reports that she has been seeing Dr. Leafy Ro for weight loss.  Reports that she is lost approximately 30 pounds.  She has an appointment this coming Wednesday.  The patient denies any new numbness or weakness does feel like her hands is getting gradually weaker.  She states that she does participate in physical therapy 1 time a week but then does her own exercises 3 times a week.  She uses a wheelchair primarily.  She reports that she has several more months of Betaseron left but once her prescription runs out she plans to stop this medication.  She reports that taking Acthar works better for her.  She reports that she has been on Copaxone and Avonex but never any of the oral medications.  She returns today for an evaluation.  HISTORY By long-standing patient Toni [name] is here today for a follow-up visit. 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 She has started on a BiPAP setting of 20/15 cm water and has been 100% compliance for the last 30 days with an average of 7 hours and 42 minutes. The residual AHI is still 13.9 area advanced home care did call her and told her that there with 3 days consecutively during which her AHI was markedly reduced.  This occurred during May, and it related to Mrs.  [name] being given at double dose of diuretics , while she was  At [facility] at the time. It was most nights after a high diuretic was given that she slept the best and happily with congestive central apnea. In the meantime, her cardiologist, Dr. Debara Pickett, has increased Lasix from 60-80 mg daily but she has not had the same effect as she had at 100 mg daily. She is continuously in a fib now. No ablation as long as apnea is not treated.   Dr. Leafy Ro did an extensive lab panel,  Good HB a1c. She was low on protein- and she sees a nutritionist. [name] is now cooking for her. Lots of chicken and vegetables- and she enjoys the food.   MS stable on Acthar. No diabetes on acthar. Melatonin helps sleep. Given that Toni [name] has suffered from [place] for the last 25 years, almost 30 years now she endorsed the fatigue score of only 24 points, and a Epworth sleepiness score of 13 points which I don't consider too bad.   The patient still uses Betaseron injections and has a skin lesion on her right 5 that seems to be an irritated injection site lasting now for over 3 weeks. The area is red, but not hot, not indurated and there are no blisters. I asked her to use an antihistamine cream or prednisone cream to  see if he can reduce the size of the lesion which is now good 3 inches diameter. Toni [name] also has a bruise, almost a blister, in her popliteal fossa at the exact location where her stockings and. She has not worn compression stockings for 3 weeks and I asked her not to wear them until the skin lesion has healed, this may also benefit from the same cream. It is not moist it is not weeping. The Pt at her home is checking it , applied neosporin.   REVIEW OF SYSTEMS: Out of a complete 14 system review of symptoms, the patient complains only of the following symptoms, and all other reviewed systems are negative.  Numbness, speech difficulty, weakness, constipation, apnea, frequent waking, daytime  sleepiness  ALLERGIES: Allergies  Allergen Reactions  . Hydrocodone Nausea And Vomiting    Hydrocodone causes vomiting  . Oxycodone Nausea And Vomiting    Oral oxycodone causes vomiting  . Chlorhexidine Gluconate Rash    Sores at site  . Zoloft [Sertraline] Hives, Swelling and Rash    HOME MEDICATIONS: Outpatient Medications Prior to Visit  Medication Sig Dispense Refill  . acetaminophen (TYLENOL) 500 MG tablet Take 500-1,000 mg by mouth every 6 (six) hours as needed for fever or headache (or pain).    Marland Kitchen albuterol (PROVENTIL HFA;VENTOLIN HFA) 108 (90 Base) MCG/ACT inhaler Inhale 1-2 puffs every 6 (six) hours as needed into the lungs for wheezing or shortness of breath. 1 Inhaler 0  . ALPRAZolam (XANAX) 0.5 MG tablet Take 1 tablet (0.5 mg total) by mouth 2 (two) times daily as needed for anxiety. 20 tablet 0  . azithromycin (ZITHROMAX) 250 MG tablet Take 25 mg daily by mouth.  0  . baclofen (LIORESAL) 20 MG tablet Take 1 tablet (20 mg total) by mouth 3 (three) times daily as needed for muscle spasms. (Patient taking differently: Take 20 mg by mouth See admin instructions. Once to twice a day for breakthrough spasms) 180 each 3  . Biotin 10000 MCG TABS Take 1,000 mcg by mouth daily.    Marland Kitchen buPROPion (WELLBUTRIN SR) 150 MG 12 hr tablet Take 1 tablet (150 mg total) by mouth 2 (two) times daily. 60 tablet 0  . CALCIUM CITRATE PO Take 2 tablets by mouth 2 (two) times daily.    . cetirizine (ZYRTEC) 10 MG tablet Take 10 mg by mouth daily.    . Cholecalciferol (VITAMIN D) 2000 units tablet Take 1 tablet (2,000 Units total) by mouth 2 (two) times daily.  0  . co-enzyme Q-10 30 MG capsule Take 30 mg by mouth daily.    . corticotropin (ATHACAR H.P.) 80 UNIT/ML injectable gel Inject 80 units subcutaneously once every other day  for a total of 10 days , repeat  every 30 days. 5 mL 5  . dextromethorphan-guaiFENesin (MUCINEX DM) 30-600 MG 12hr tablet Take 1 tablet 2 (two) times daily by mouth. 14 tablet  1  . diltiazem (CARDIZEM CD) 120 MG 24 hr capsule TAKE 1 CAPSULE BY MOUTH  DAILY 90 capsule 2  . docusate sodium (COLACE) 100 MG capsule Take 1 capsule (100 mg total) by mouth daily. 10 capsule 0  . fluticasone (FLONASE) 50 MCG/ACT nasal spray Place 2 sprays into both nostrils daily as needed for allergies.     . furosemide (LASIX) 40 MG tablet Take 2 tablets (67m) by mouth in the morning and 1 tablet (466m in the afternoon    . gabapentin (NEURONTIN) 300 MG capsule TAKE 1 CAPSULE BY MOUTH  UP  TO 4 TIMES DAILY 360 capsule 3  . GENERLAC 10 GM/15ML SOLN TAKE 7.5 ML BY MOUTH TWICE DAILY 900 mL 5  . Interferon Beta-1b (BETASERON/EXTAVIA) 0.3 MG KIT injection Every other day , subcutaneus. Please titrate pt based on normal titration schedule. 1 each 12  . levothyroxine (SYNTHROID, LEVOTHROID) 75 MCG tablet Take 75 mcg by mouth daily.    . Multiple Vitamins-Minerals (CENTRUM SILVER PO) Take 1 tablet by mouth daily.    . Nebivolol HCl (BYSTOLIC) 20 MG TABS Take 20 mg by mouth daily.    . Omega-3 Fatty Acids (FISH OIL) 1000 MG CAPS Take 1,000 mg by mouth daily.    Marland Kitchen omeprazole (PRILOSEC) 20 MG capsule Take 20 mg by mouth daily.     . polyethylene glycol (MIRALAX) packet Take 17 g daily by mouth. 14 each 1  . psyllium (METAMUCIL SMOOTH TEXTURE) 28 % packet Take 1 packet by mouth every other day.    . ranitidine (ZANTAC) 150 MG tablet Take 150 mg by mouth at bedtime.     . simvastatin (ZOCOR) 20 MG tablet Take 10 mg by mouth at bedtime.     . SYRINGE/NEEDLE, DISP, 1 ML (BD LUER-LOK SYRINGE) 25G X 5/8" 1 ML MISC Use as directed to inject  Acthar (Patient not taking: Reported on 07/08/2017) 10 each prn  . Tamsulosin HCl (FLOMAX) 0.4 MG CAPS Take 0.4 mg by mouth daily.    Marland Kitchen UNABLE TO FIND Baclofen pump: Continuous    . warfarin (COUMADIN) 5 MG tablet TAKE 1/2 TO 1 TABLET BY  MOUTH DAILY AS DIRECTED BY  COUMADIN CLINIC. 90 tablet 0   Facility-Administered Medications Prior to Visit  Medication Dose Route  Frequency Provider Last Rate Last Dose  . baclofen (LIORESAL) intrathecal injection 40 mg/21m  40 mg Intrathecal Once WKathrynn Ducking MD        PAST MEDICAL HISTORY: Past Medical History:  Diagnosis Date  . Abnormality of gait 03/01/2015  . Anxiety   . Arthritis    "knees" (01/19/2016)  . At[place] with [place]VR [place] 01/19/2016  . Back pain   . Chest pain   . Childhood asthma   . Chronic pain    "nerve pain from the MS"  . Constipation   . Depression   . DVT (deep venous thrombosis) ([facility] 1983   "RLE; may have just been phlebitis; it was before the age of dopplers"  . 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 GERD (gastroesophageal reflux disease)   . History of stress test 06/21/10   limited exam with some degree of breast attenuatin of the apex however a component of apical ischemia is not excluded, EF 62%; cardiac cath   . HLD (hyperlipidemia)   . Hx of echocardiogram 04/22/10   EF 55-60%, no valve issues  . Hypertension   . Hypothyroidism   . Joint pain   . Migraine    "none since I stopped beta blocker in 1990s" (01/19/2016)  . Multiple sclerosis ([facility]    has had this may 1989-[facility] reg doc  . OSA on CPAP   . Osteoarthritis   . PAF (paroxysmal atrial fibrillation) ([facility]    on coumadin; documented on monitor 06/2010  . Pneumonia 11/2011  . Shortness of breath   . Thyroid disease     PAST SURGICAL HISTORY: Past Surgical History:  Procedure Laterality Date  . ANTERIOR CERVICAL DECOMP/DISCECTOMY FUSION  01/2004  . BACK SURGERY    . CARDIAC CATHETERIZATION  06/22/2010   normal coronary arteries, PAF  . CATARACT EXTRACTION, BILATERAL Bilateral   . CHILD BIRTHS     X2  . DILATION AND CURETTAGE OF UTERUS    . INFUSION PUMP IMPLANTATION  ~ 2009   baclofen infusion in lower abd  . PAIN PUMP REVISION N/A 07/29/2014   Procedure: Baclofen pump replacement;  Surgeon: Erline Levine, MD;  Location: [facility];   Service: Neurosurgery;  Laterality: N/A;  Baclofen pump replacement  . TONSILLECTOMY AND ADENOIDECTOMY    . VARICOSE VEIN SURGERY  ~ 1968  . VENOUS ABLATION  12/16/2010   radiofreq ablation -Dr Elisabeth Cara and [facility]  . WISDOM TOOTH EXTRACTION      FAMILY HISTORY: Family History  Problem Relation Age of Onset  . Coronary artery disease Father        at age 53  . Hyperlipidemia Father   . Thyroid disease Father   . Coronary artery disease Maternal Grandmother   . Depression Maternal Grandmother   . Cancer Paternal Grandmother   . Depression Mother   . Sudden death Mother   . Anxiety disorder Mother   . Alcoholism Son     SOCIAL HISTORY: Social History   Socioeconomic History  . Marital status: Married    Spouse name: [name]  . Number of children: 2  . Years of education: COLLEGE  . Highest education level: Not on file  Social Needs  . Financial resource strain: Not on file  . Food insecurity - worry: Not on file  . Food insecurity - inability: Not on file  . Transportation needs - medical: Not on file  . Transportation needs - non-medical: Not on file  Occupational History  . Occupation: RETIRED  Tobacco Use  . Smoking status: Former Smoker    Packs/day: 0.50    Years: 7.00    Pack years: 3.50    Types: Cigarettes    Last attempt to quit: 08/21/1976    Years since quitting: 40.9  . Smokeless tobacco: Never Used  Substance and Sexual Activity  . Alcohol use: Yes    Alcohol/week: 0.6 oz    Types: 1 Glasses of wine per week  . Drug use: No  . Sexual activity: Not Currently  Other Topics Concern  . Not on file  Social History Narrative   Lives in [place] with Tillman Sers [phone], [phone]   Currently in rehab      PHYSICAL EXAM  Vitals:   07/23/17 1347  BP: 116/68  Pulse: 78  Height: '5\' 3"'$  (1.6 m)   Body mass index is 42.16 kg/m.  Generalized: Well developed, in no acute distress   Neurological examination  Mentation: Alert oriented to time, place,  history taking. Follows all commands speech and language fluent Cranial nerve II-XII: Pupils were equal round reactive to light. Extraocular movements were full, visual field were full on confrontational test. Facial sensation and strength were normal. Uvula tongue midline. Head turning and shoulder shrug  were normal and symmetric. Motor: The motor testing reveals 5 over 5 strength of all 4 extremities. Good symmetric motor tone is noted throughout.  Sensory: Sensory testing is intact to soft touch on all 4 extremities. No evidence of extinction is noted.  Coordination: Cerebellar testing reveals good finger-nose-finger and heel-to-shin bilaterally.  Gait and station: Patient is in a wheelchair.  DIAGNOSTIC DATA (LABS, IMAGING, TESTING) - I reviewed patient records, labs, notes, testing and imaging myself  where available.  Lab Results  Component Value Date   WBC 8.4 07/08/2017   HGB 15.2 (H) 07/08/2017   HCT 45.0 07/08/2017   MCV 93.2 07/08/2017   PLT 173 07/08/2017      Component Value Date/Time   NA 139 07/08/2017 1214   NA 138 04/05/2017 1450   K 3.9 07/08/2017 1214   CL 99 (L) 07/08/2017 1214   CO2 31 07/08/2017 1214   GLUCOSE 92 07/08/2017 1214   BUN 22 (H) 07/08/2017 1214   BUN 27 04/05/2017 1450   CREATININE 0.67 07/08/2017 1214   CALCIUM 9.3 07/08/2017 1214   PROT 6.7 07/08/2017 1214   PROT 6.0 04/05/2017 1450   ALBUMIN 3.8 07/08/2017 1214   ALBUMIN 3.9 04/05/2017 1450   AST 21 07/08/2017 1214   ALT 23 07/08/2017 1214   ALKPHOS 55 07/08/2017 1214   BILITOT 0.8 07/08/2017 1214   BILITOT 0.2 04/05/2017 1450   GFRNONAA >60 07/08/2017 1214   GFRAA >60 07/08/2017 1214   Lab Results  Component Value Date   CHOL 143 03/08/2017   HDL 57 03/08/2017   LDLCALC 68 03/08/2017   TRIG 89 03/08/2017   CHOLHDL 3.0 04/22/2010   Lab Results  Component Value Date   HGBA1C 5.4 03/08/2017   No results found for: VITAMINB12 Lab Results  Component Value Date   TSH 2.410  03/08/2017      ASSESSMENT AND PLAN 71 y.o. year old female  has a past medical history of Abnormality of gait (03/01/2015), Anxiety, Arthritis, Atrial fibrillation with RVR ([place]) (01/19/2016), Back pain, Chest pain, Childhood asthma, Chronic pain, Constipation, Depression, DVT (deep venous thrombosis) ([place]) (1983), Edema, GERD (gastroesophageal reflux disease), History of stress test (06/21/10), HLD (hyperlipidemia), echocardiogram (04/22/10), Hypertension, Hypothyroidism, Joint pain, Migraine, Multiple sclerosis ([place]), OSA on CPAP, Osteoarthritis, PAF (paroxysmal atrial fibrillation) ([place]), Pneumonia (11/2011), Shortness of breath, and Thyroid disease. here with:  1.  Obstructive sleep apnea on BiPAP 2.  Multiple sclerosis  Overall the patient is doing well.  She will continue on BiPAP therapy.  Her compliance is excellent however her residual AHI remains slightly elevated.  The patient will continue on Betaseron for now.  I did advise that if she decides to discontinue this medication she should let us know if she would like to start an additional medication.  For now she will remain on Acthar.  She is advised that if her symptoms worsen or she develops new symptoms she should let us know.  She will follow-up in 4 months with Dr. Mechele Claude, MSN, NP-C 07/23/2017, 1:41 PM [facility] [address], [place], [place] [address] [phone]

## 2017-07-23 NOTE — Patient Instructions (Addendum)
Your Plan:  Continue Generlac for constipation- dose adjusted  Once you stop betaseron let us know if you would like to try another medication If your symptoms worsen or you develop new symptoms please let us know.    Thank you for coming to see [name] at [facility]. I hope we have been able to provide you high quality care today.  You may receive a patient satisfaction survey over the next few weeks. We would appreciate your feedback and comments so that we may continue to improve ourselves and the health of our patients.

## 2017-07-24 DIAGNOSIS — E119 Type 2 diabetes mellitus without complications: Secondary | ICD-10-CM | POA: Diagnosis not present

## 2017-07-24 DIAGNOSIS — I110 Hypertensive heart disease with heart failure: Secondary | ICD-10-CM | POA: Diagnosis not present

## 2017-07-24 DIAGNOSIS — G35 Multiple sclerosis: Secondary | ICD-10-CM | POA: Diagnosis not present

## 2017-07-24 DIAGNOSIS — I509 Heart failure, unspecified: Secondary | ICD-10-CM | POA: Diagnosis not present

## 2017-07-24 DIAGNOSIS — M48062 Spinal stenosis, lumbar region with neurogenic claudication: Secondary | ICD-10-CM | POA: Diagnosis not present

## 2017-07-24 DIAGNOSIS — I480 Paroxysmal atrial fibrillation: Secondary | ICD-10-CM | POA: Diagnosis not present

## 2017-07-25 ENCOUNTER — Ambulatory Visit (INDEPENDENT_AMBULATORY_CARE_PROVIDER_SITE_OTHER): Payer: Medicare Other | Admitting: Family Medicine

## 2017-07-25 VITALS — BP 116/79 | HR 116 | Temp 98.3°F | Ht 63.0 in | Wt 228.0 lb

## 2017-07-25 DIAGNOSIS — Z6841 Body Mass Index (BMI) 40.0 and over, adult: Secondary | ICD-10-CM | POA: Diagnosis not present

## 2017-07-25 DIAGNOSIS — J40 Bronchitis, not specified as acute or chronic: Secondary | ICD-10-CM | POA: Diagnosis not present

## 2017-07-25 DIAGNOSIS — F3289 Other specified depressive episodes: Secondary | ICD-10-CM

## 2017-07-25 MED ORDER — BUPROPION HCL ER (SR) 200 MG PO TB12: 200.0000 mg | ORAL_TABLET | Freq: Every day | ORAL | 0 refills | Status: DC

## 2017-07-25 NOTE — Progress Notes (Signed)
I agree with the assessment and plan as directed by NP .The patient is known to me .   Malai Lady, MD  

## 2017-07-25 NOTE — Progress Notes (Signed)
Office: [phone]  /  Fax: [phone]   HPI:   Chief Complaint: OBESITY Toni [name] is here to discuss her progress with her obesity treatment plan. She is on the Category 2 plan and is following her eating plan approximately 75 % of the time. She states she is exercising 0 minutes 0 times per week. Toni [name] continues doing very well with weight loss, but she has had a severe bronchitis and hasn't been able to eat all her food, but she is now getting back to the category 2 plan. Her weight is 228 lb (103.4 kg) today and has had a weight loss of 10 pounds over a period of 6 weeks since her last visit. She has lost 29 lbs since starting treatment with [name].  Bronchitis Toni [name] bronchitis is slowly improving. She is not wheezing anymore and is starting to breathe better. Toni [name] feels 90% improved and she has finished her medications.  Depression with emotional eating behaviors Toni [name] is on Wellbutrin SR 150 mg bid, but she thinks she no longer needs the 2nd dose. Toni [name] struggles with emotional eating and using food for comfort to the extent that it is negatively impacting her health. She often snacks when she is not hungry. Sirenity sometimes feels she is out of control and then feels guilty that she made poor food choices. She has been working on behavior modification techniques to help reduce her emotional eating and has been somewhat successful. She shows no sign of suicidal or homicidal ideations.  Depression screen [facility] 2/9 03/08/2017 04/23/2014  Decreased Interest 0 2  Down, Depressed, Hopeless 1 2  PHQ - 2 Score 1 4  Altered sleeping 2 2  Tired, decreased energy 2 2  Change in appetite 1 1  Feeling bad or failure about yourself  0 2  Trouble concentrating 0 0  Moving slowly or fidgety/restless 0 1  Suicidal thoughts 0 1  PHQ-9 Score 6 13  Some recent data might be hidden      ALLERGIES: Allergies  Allergen Reactions  . Hydrocodone Nausea And Vomiting   Hydrocodone causes vomiting  . Oxycodone Nausea And Vomiting    Oral oxycodone causes vomiting  . Chlorhexidine Gluconate Rash    Sores at site  . Zoloft [Sertraline] Hives, Swelling and Rash    MEDICATIONS: Current Outpatient Medications on File Prior to Visit  Medication Sig Dispense Refill  . acetaminophen (TYLENOL) 500 MG tablet Take 500-1,000 mg by mouth every 6 (six) hours as needed for fever or headache (or pain).    Marland Kitchen ALPRAZolam (XANAX) 0.5 MG tablet Take 1 tablet (0.5 mg total) by mouth 2 (two) times daily as needed for anxiety. 20 tablet 0  . baclofen (LIORESAL) 20 MG tablet Take 1 tablet (20 mg total) by mouth 3 (three) times daily as needed for muscle spasms. (Patient taking differently: Take 20 mg by mouth See admin instructions. Once to twice a day for breakthrough spasms) 180 each 3  . Biotin 10000 MCG TABS Take 1,000 mcg by mouth daily.    Marland Kitchen CALCIUM CITRATE PO Take 2 tablets by mouth 2 (two) times daily.    . cetirizine (ZYRTEC) 10 MG tablet Take 10 mg by mouth daily.    . Cholecalciferol (VITAMIN D) 2000 units tablet Take 1 tablet (2,000 Units total) by mouth 2 (two) times daily.  0  . co-enzyme Q-10 30 MG capsule Take 30 mg by mouth daily.    . corticotropin (ATHACAR H.P.) 80 UNIT/ML injectable gel Inject 80 units subcutaneously  once every other day  for a total of 10 days , repeat  every 30 days. 5 mL 5  . diltiazem (CARDIZEM CD) 120 MG 24 hr capsule TAKE 1 CAPSULE BY MOUTH  DAILY 90 capsule 2  . docusate sodium (COLACE) 100 MG capsule Take 1 capsule (100 mg total) by mouth daily. 10 capsule 0  . fluticasone (FLONASE) 50 MCG/ACT nasal spray Place 2 sprays into both nostrils daily as needed for allergies.     . furosemide (LASIX) 40 MG tablet Take 2 tablets (47m) by mouth in the morning and 1 tablet (4100m in the afternoon    . gabapentin (NEURONTIN) 300 MG capsule TAKE 1 CAPSULE BY MOUTH UP  TO 4 TIMES DAILY 360 capsule 3  . Interferon Beta-1b (BETASERON/EXTAVIA) 0.3 MG  KIT injection Every other day , subcutaneus. Please titrate pt based on normal titration schedule. 1 each 12  . lactulose, encephalopathy, (GENERLAC) 10 GM/15ML SOLN Take 15 mLs (10 g total) by mouth 2 (two) times daily as needed. 946 mL 5  . levothyroxine (SYNTHROID, LEVOTHROID) 75 MCG tablet Take 75 mcg by mouth daily.    . Multiple Vitamins-Minerals (CENTRUM SILVER PO) Take 1 tablet by mouth daily.    . Nebivolol HCl (BYSTOLIC) 20 MG TABS Take 20 mg by mouth daily.    . Omega-3 Fatty Acids (FISH OIL) 1000 MG CAPS Take 1,000 mg by mouth daily.    . Marland Kitchenmeprazole (PRILOSEC) 20 MG capsule Take 20 mg by mouth daily.     . psyllium (METAMUCIL SMOOTH TEXTURE) 28 % packet Take 1 packet by mouth every other day.    . ranitidine (ZANTAC) 150 MG tablet Take 150 mg by mouth at bedtime.     . simvastatin (ZOCOR) 20 MG tablet Take 10 mg by mouth at bedtime.     . SYRINGE/NEEDLE, DISP, 1 ML (BD LUER-LOK SYRINGE) 25G X 5/8" 1 ML MISC Use as directed to inject  Acthar 10 each prn  . Tamsulosin HCl (FLOMAX) 0.4 MG CAPS Take 0.4 mg by mouth daily.    . Marland KitchenNABLE TO FIND Baclofen pump: Continuous    . warfarin (COUMADIN) 5 MG tablet TAKE 1/2 TO 1 TABLET BY  MOUTH DAILY AS DIRECTED BY  COUMADIN CLINIC. 90 tablet 0   Current Facility-Administered Medications on File Prior to Visit  Medication Dose Route Frequency Provider Last Rate Last Dose  . baclofen (LIORESAL) intrathecal injection 40 mg/2046m40 mg Intrathecal Once WilKathrynn DuckingD        PAST MEDICAL HISTORY: Past Medical History:  Diagnosis Date  . Abnormality of gait 03/01/2015  . Anxiety   . Arthritis    "knees" (01/19/2016)  . Atrial fibrillation with RVR ([facility]/31/2017  . Back pain   . Chest pain   . Childhood asthma   . Chronic pain    "nerve pain from the MS"  . Constipation   . Depression   . DVT (deep venous [place]) [id number]   "RLE; may have just been phlebitis; it was before the age of dopplers"  . Edema    varicose veins with  severe venous insuff in R and L GSV' ablation of R GSV 2012  . GERD (gastroesophageal reflux disease)   . History of stress test 06/21/10   limited exam with some degree of breast attenuatin of the apex however a component of apical ischemia is not excluded, EF 62%; cardiac cath   . HLD (hyperlipidemia)   . Hx of echocardiogram  04/22/10   EF 55-60%, no valve issues  . Hypertension   . Hypothyroidism   . Joint pain   . Migraine    "none since I stopped beta blocker in 1990s" (01/19/2016)  . Multiple sclerosis ([place])    has had this may 1989-[place] reg doc  . OSA on CPAP   . Osteoarthritis   . PAF (paroxysmal atrial fibrillation) ([place])    on coumadin; documented on monitor 06/2010  . Pneumonia 11/2011  . Shortness of breath   . Thyroid disease     PAST SURGICAL HISTORY: Past Surgical History:  Procedure Laterality Date  . ANTERIOR CERVICAL DECOMP/DISCECTOMY FUSION  01/2004  . BACK SURGERY    . CARDIAC CATHETERIZATION  06/22/2010   normal coronary arteries, PAF  . CATARACT EXTRACTION, BILATERAL Bilateral   . CHILD BIRTHS     X2  . DILATION AND CURETTAGE OF UTERUS    . INFUSION PUMP IMPLANTATION  ~ 2009   baclofen infusion in lower abd  . PAIN PUMP REVISION N/A 07/29/2014   Procedure: Baclofen pump replacement;  Surgeon: Erline Levine, MD;  Location: [name];  Service: Neurosurgery;  Laterality: N/A;  Baclofen pump replacement  . TONSILLECTOMY AND ADENOIDECTOMY    . VARICOSE VEIN SURGERY  ~ 1968  . VENOUS ABLATION  12/16/2010   radiofreq ablation -[facility] Elisabeth Cara [facility]  . WISDOM TOOTH EXTRACTION      SOCIAL HISTORY: Social History   Tobacco Use  . Smoking status: Former Smoker    Packs/day: 0.50    Years: 7.00    Pack years: 3.50    Types: Cigarettes    Last attempt to quit: 08/21/1976    Years since quitting: 40.9  . Smokeless tobacco: Never Used  Substance Use Topics  . Alcohol use: Yes    Alcohol/week: 0.6 oz    Types: 1 Glasses of wine per week  . Drug use: No     FAMILY HISTORY: Family History  Problem Relation Age of Onset  . Coronary artery disease Father        at age 76  . Hyperlipidemia Father   . Thyroid disease Father   . Coronary artery disease Maternal Grandmother   . Depression Maternal Grandmother   . Cancer Paternal Grandmother   . Depression Mother   . Sudden death Mother   . Anxiety disorder Mother   . Alcoholism Son     ROS: Review of Systems  Constitutional: Positive for weight loss.  Respiratory: Negative for wheezing.   Psychiatric/Behavioral: Positive for depression. Negative for suicidal ideas.    PHYSICAL EXAM: Blood pressure 116/79, pulse (!) 116, temperature 98.3 F (36.8 C), temperature source Oral, height '5\' 3"'  (1.6 m), weight 228 lb (103.4 kg), SpO2 96 %. Body mass index is 40.39 kg/m. Physical Exam  Constitutional: She is oriented to person, place, and time. She appears well-developed and well-nourished.  Cardiovascular: Normal rate.  Pulmonary/Chest: Effort normal.  Musculoskeletal: Normal range of motion.  Neurological: She is oriented to person, place, and time.  Skin: Skin is warm and dry.  Psychiatric: She has a normal mood and affect. Her behavior is normal.  Vitals reviewed.   RECENT LABS AND TESTS: BMET    Component Value Date/Time   NA 139 07/08/2017 1214   NA 138 04/05/2017 1450   K 3.9 07/08/2017 1214   CL 99 (L) 07/08/2017 1214   CO2 31 07/08/2017 1214   GLUCOSE 92 07/08/2017 1214   BUN 22 (H) 07/08/2017 1214  BUN 27 04/05/2017 1450   CREATININE 0.67 07/08/2017 1214   CALCIUM 9.3 07/08/2017 1214   GFRNONAA >60 07/08/2017 1214   GFRAA >60 07/08/2017 1214   Lab Results  Component Value Date   HGBA1C 5.4 03/08/2017   HGBA1C  04/21/2010    5.5 (NOTE)                                                                       According to the ADA Clinical Practice Recommendations for 2011, when HbA1c is used as a screening test:   >=6.5%   Diagnostic of Diabetes Mellitus            (if abnormal result  is confirmed)  5.7-6.4%   Increased risk of developing Diabetes Mellitus  References:Diagnosis and Classification of Diabetes Mellitus,Diabetes WNUU,7253,66(YQIHK 1):S62-S69 and Standards of Medical Care in         Diabetes - 2011,Diabetes Care,2011,34  (Suppl 1):S11-S61.   Lab Results  Component Value Date   INSULIN 20.4 03/08/2017   CBC    Component Value Date/Time   WBC 8.4 07/08/2017 1214   RBC 4.83 07/08/2017 1214   HGB 15.2 (H) 07/08/2017 1214   HGB 14.1 03/08/2017 1152   HCT 45.0 07/08/2017 1214   HCT 43.0 03/08/2017 1152   PLT 173 07/08/2017 1214   PLT 219 12/18/2014 1157   MCV 93.2 07/08/2017 1214   MCV 93 03/08/2017 1152   MCH 31.5 07/08/2017 1214   MCHC 33.8 07/08/2017 1214   RDW 13.3 07/08/2017 1214   RDW 13.3 03/08/2017 1152   LYMPHSABS 1.6 07/08/2017 1214   LYMPHSABS 2.2 03/08/2017 1152   MONOABS 1.3 (H) 07/08/2017 1214   EOSABS 0.2 07/08/2017 1214   EOSABS 0.1 03/08/2017 1152   BASOSABS 0.0 07/08/2017 1214   BASOSABS 0.0 03/08/2017 1152   Iron/TIBC/Ferritin/ %Sat No results found for: IRON, TIBC, FERRITIN, IRONPCTSAT Lipid Panel     Component Value Date/Time   CHOL 143 03/08/2017 1152   TRIG 89 03/08/2017 1152   HDL 57 03/08/2017 1152   CHOLHDL 3.0 04/22/2010 0536   VLDL 29 04/22/2010 0536   LDLCALC 68 03/08/2017 1152   Hepatic Function Panel     Component Value Date/Time   PROT 6.7 07/08/2017 1214   PROT 6.0 04/05/2017 1450   ALBUMIN 3.8 07/08/2017 1214   ALBUMIN 3.9 04/05/2017 1450   AST 21 07/08/2017 1214   ALT 23 07/08/2017 1214   ALKPHOS 55 07/08/2017 1214   BILITOT 0.8 07/08/2017 1214   BILITOT 0.2 04/05/2017 1450      Component Value Date/Time   TSH 2.410 03/08/2017 1152   TSH 1.083 08/06/2016 0647   TSH 1.871 01/19/2016 2302   TSH 0.206 (L) 03/10/2011 1351   TSH 1.636 06/18/2010 1430    ASSESSMENT AND PLAN: Bronchitis  Other depression - with emotional eating - Plan: buPROPion (WELLBUTRIN SR) 200 MG 12  hr tablet  Class 3 severe obesity with serious comorbidity and body mass index (BMI) of 40.0 to 44.9 in adult, unspecified obesity type ([place])  PLAN:  Bronchitis Josslyn was encouraged to continue back to diet, to keep up her strength and mood spending large amounts of time in the cold weather.  Depression with Emotional Eating Behaviors We discussed  behavior modification techniques today to help Brilynn deal with her emotional eating and depression. She has agreed to decrease  Wellbutrin SR to 200 mg qam #30 with no refills and will follow up as directed.  Obesity Voncille is currently in the action stage of change. As such, her goal is to continue with weight loss efforts She has agreed to follow the Category 2 plan Marixa has been instructed to work up to a goal of 150 minutes of combined cardio and strengthening exercise per week for weight loss and overall health benefits. We discussed the following Behavioral Modification Strategies today: no skipping meals, travel eating strategies, increasing lean protein intake and decreasing simple carbohydrates   Sakeena has agreed to follow up with our clinic in 4 weeks. She was informed of the importance of frequent follow up visits to maximize her success with intensive lifestyle modifications for her multiple health conditions.  I, Doreene Nest, am acting as transcriptionist for Dennard Nip, MD  I have reviewed the above documentation for accuracy and completeness, and I agree with the above. -Dennard Nip, MD    OBESITY BEHAVIORAL INTERVENTION VISIT  Today's visit was # 7 out of 22.  Starting weight: 257 lbs Starting date: 03/08/17 Today's weight : 228 lbs  Today's date: 07/25/2017 Total lbs lost to date: 29 (Patients must lose 7 lbs in the first 6 months to continue with counseling)   ASK: We discussed the diagnosis of obesity with [name] today and Railey agreed to give [name] permission to discuss obesity  behavioral modification therapy today.  ASSESS: Dahlila has the diagnosis of obesity and her BMI today is 40.4 Malia is in the action stage of change   ADVISE: Ambar was educated on the multiple health risks of obesity as well as the benefit of weight loss to improve her health. She was advised of the need for long term treatment and the importance of lifestyle modifications.  AGREE: Multiple dietary modification options and treatment options were discussed and  Buffey agreed to follow the Category 2 plan We discussed the following Behavioral Modification Strategies today: no skipping meals, travel eating strategies,  increasing lean protein intake and decreasing simple carbohydrates

## 2017-07-26 DIAGNOSIS — M48062 Spinal stenosis, lumbar region with neurogenic claudication: Secondary | ICD-10-CM | POA: Diagnosis not present

## 2017-07-26 DIAGNOSIS — I509 Heart failure, unspecified: Secondary | ICD-10-CM | POA: Diagnosis not present

## 2017-07-26 DIAGNOSIS — E119 Type 2 diabetes mellitus without complications: Secondary | ICD-10-CM | POA: Diagnosis not present

## 2017-07-26 DIAGNOSIS — I110 Hypertensive heart disease with heart failure: Secondary | ICD-10-CM | POA: Diagnosis not present

## 2017-07-26 DIAGNOSIS — G35 Multiple sclerosis: Secondary | ICD-10-CM | POA: Diagnosis not present

## 2017-07-26 DIAGNOSIS — I480 Paroxysmal atrial fibrillation: Secondary | ICD-10-CM | POA: Diagnosis not present

## 2017-07-30 DIAGNOSIS — E119 Type 2 diabetes mellitus without complications: Secondary | ICD-10-CM | POA: Diagnosis not present

## 2017-07-30 DIAGNOSIS — M48062 Spinal stenosis, lumbar region with neurogenic claudication: Secondary | ICD-10-CM | POA: Diagnosis not present

## 2017-07-30 DIAGNOSIS — G4733 Obstructive sleep apnea (adult) (pediatric): Secondary | ICD-10-CM | POA: Diagnosis not present

## 2017-07-30 DIAGNOSIS — E785 Hyperlipidemia, unspecified: Secondary | ICD-10-CM | POA: Diagnosis not present

## 2017-07-30 DIAGNOSIS — I110 Hypertensive heart disease with heart failure: Secondary | ICD-10-CM | POA: Diagnosis not present

## 2017-07-30 DIAGNOSIS — F419 Anxiety disorder, unspecified: Secondary | ICD-10-CM | POA: Diagnosis not present

## 2017-07-30 DIAGNOSIS — I509 Heart failure, unspecified: Secondary | ICD-10-CM | POA: Diagnosis not present

## 2017-07-30 DIAGNOSIS — I480 Paroxysmal atrial fibrillation: Secondary | ICD-10-CM | POA: Diagnosis not present

## 2017-07-30 DIAGNOSIS — G35 Multiple sclerosis: Secondary | ICD-10-CM | POA: Diagnosis not present

## 2017-07-30 DIAGNOSIS — F329 Major depressive disorder, single episode, unspecified: Secondary | ICD-10-CM | POA: Diagnosis not present

## 2017-08-02 ENCOUNTER — Ambulatory Visit (INDEPENDENT_AMBULATORY_CARE_PROVIDER_SITE_OTHER): Payer: Medicare Other | Admitting: Pharmacist Clinician (PhC)/ Clinical Pharmacy Specialist

## 2017-08-02 DIAGNOSIS — I4821 Permanent atrial fibrillation: Secondary | ICD-10-CM

## 2017-08-02 DIAGNOSIS — I482 Chronic atrial fibrillation: Secondary | ICD-10-CM

## 2017-08-02 DIAGNOSIS — G35 Multiple sclerosis: Secondary | ICD-10-CM | POA: Diagnosis not present

## 2017-08-02 DIAGNOSIS — M48062 Spinal stenosis, lumbar region with neurogenic claudication: Secondary | ICD-10-CM | POA: Diagnosis not present

## 2017-08-02 DIAGNOSIS — E119 Type 2 diabetes mellitus without complications: Secondary | ICD-10-CM | POA: Diagnosis not present

## 2017-08-02 DIAGNOSIS — I110 Hypertensive heart disease with heart failure: Secondary | ICD-10-CM | POA: Diagnosis not present

## 2017-08-02 DIAGNOSIS — I509 Heart failure, unspecified: Secondary | ICD-10-CM | POA: Diagnosis not present

## 2017-08-02 DIAGNOSIS — Z7901 Long term (current) use of anticoagulants: Secondary | ICD-10-CM

## 2017-08-02 DIAGNOSIS — I480 Paroxysmal atrial fibrillation: Secondary | ICD-10-CM | POA: Diagnosis not present

## 2017-08-02 LAB — POCT INR: INR: 1.5

## 2017-08-06 ENCOUNTER — Other Ambulatory Visit: Payer: Self-pay | Admitting: Internal Medicine

## 2017-08-07 DIAGNOSIS — I110 Hypertensive heart disease with heart failure: Secondary | ICD-10-CM | POA: Diagnosis not present

## 2017-08-07 DIAGNOSIS — I480 Paroxysmal atrial fibrillation: Secondary | ICD-10-CM | POA: Diagnosis not present

## 2017-08-07 DIAGNOSIS — M48062 Spinal stenosis, lumbar region with neurogenic claudication: Secondary | ICD-10-CM | POA: Diagnosis not present

## 2017-08-07 DIAGNOSIS — E119 Type 2 diabetes mellitus without complications: Secondary | ICD-10-CM | POA: Diagnosis not present

## 2017-08-07 DIAGNOSIS — G35 Multiple sclerosis: Secondary | ICD-10-CM | POA: Diagnosis not present

## 2017-08-07 DIAGNOSIS — I509 Heart failure, unspecified: Secondary | ICD-10-CM | POA: Diagnosis not present

## 2017-08-09 DIAGNOSIS — I10 Essential (primary) hypertension: Secondary | ICD-10-CM | POA: Diagnosis not present

## 2017-08-09 DIAGNOSIS — G35 Multiple sclerosis: Secondary | ICD-10-CM | POA: Diagnosis not present

## 2017-08-09 DIAGNOSIS — L8991 Pressure ulcer of unspecified site, stage 1: Secondary | ICD-10-CM | POA: Diagnosis not present

## 2017-08-20 DIAGNOSIS — M48062 Spinal stenosis, lumbar region with neurogenic claudication: Secondary | ICD-10-CM | POA: Diagnosis not present

## 2017-08-20 DIAGNOSIS — G35 Multiple sclerosis: Secondary | ICD-10-CM | POA: Diagnosis not present

## 2017-08-20 DIAGNOSIS — I110 Hypertensive heart disease with heart failure: Secondary | ICD-10-CM | POA: Diagnosis not present

## 2017-08-20 DIAGNOSIS — I509 Heart failure, unspecified: Secondary | ICD-10-CM | POA: Diagnosis not present

## 2017-08-20 DIAGNOSIS — E119 Type 2 diabetes mellitus without complications: Secondary | ICD-10-CM | POA: Diagnosis not present

## 2017-08-20 DIAGNOSIS — I480 Paroxysmal atrial fibrillation: Secondary | ICD-10-CM | POA: Diagnosis not present

## 2017-08-23 ENCOUNTER — Other Ambulatory Visit: Payer: Self-pay | Admitting: Internal Medicine

## 2017-08-23 ENCOUNTER — Ambulatory Visit (INDEPENDENT_AMBULATORY_CARE_PROVIDER_SITE_OTHER): Payer: Medicare Other | Admitting: Family Medicine

## 2017-08-23 ENCOUNTER — Ambulatory Visit (INDEPENDENT_AMBULATORY_CARE_PROVIDER_SITE_OTHER): Payer: Medicare Other | Admitting: Pharmacist Clinician (PhC)/ Clinical Pharmacy Specialist

## 2017-08-23 VITALS — BP 121/82 | HR 77 | Temp 98.3°F | Ht 63.0 in | Wt 240.0 lb

## 2017-08-23 DIAGNOSIS — F3289 Other specified depressive episodes: Secondary | ICD-10-CM | POA: Diagnosis not present

## 2017-08-23 DIAGNOSIS — Z7901 Long term (current) use of anticoagulants: Secondary | ICD-10-CM

## 2017-08-23 DIAGNOSIS — I5042 Chronic combined systolic (congestive) and diastolic (congestive) heart failure: Secondary | ICD-10-CM

## 2017-08-23 DIAGNOSIS — Z6841 Body Mass Index (BMI) 40.0 and over, adult: Secondary | ICD-10-CM | POA: Diagnosis not present

## 2017-08-23 DIAGNOSIS — I482 Chronic atrial fibrillation: Secondary | ICD-10-CM | POA: Diagnosis not present

## 2017-08-23 DIAGNOSIS — Z1231 Encounter for screening mammogram for malignant neoplasm of breast: Secondary | ICD-10-CM

## 2017-08-23 DIAGNOSIS — I4821 Permanent atrial fibrillation: Secondary | ICD-10-CM

## 2017-08-23 LAB — POCT INR: INR: 1.5

## 2017-08-23 MED ORDER — BUPROPION HCL ER (XL) 300 MG PO TB24: 300.0000 mg | ORAL_TABLET | Freq: Every day | ORAL | 0 refills | Status: DC

## 2017-08-23 NOTE — Patient Instructions (Signed)
Description   Take 1.5 tablets today Thursday Jan 3, then 1 tablet Friday Jan 4.  After that take 1 tablet daily except 1/2 tablet each Monday and Friday.  Repeat INR in 2 weeks.

## 2017-08-23 NOTE — Progress Notes (Signed)
Office: [phone]  /  Fax: [phone]   HPI:   Chief Complaint: OBESITY Toni [name] is here to discuss her progress with her obesity treatment plan. She is on the Category 2 plan and is following her eating plan approximately 85 % of the time. She states she is doing physical therapy for 45 minutes 1 times per week. Toni [name] is increased in weight over the last 4 weeks, but emphasizes, she has worked hard to not [employer]. She has a history of congestive heart failure and only takes her lasix occasionally. Now that the holidays are over, she is ready to get back on track. Her weight is 240 lb (108.9 kg) today and has had a weight gain of 12 pounds over a period of 4 weeks since her last visit. She has lost 17 lbs since starting treatment with [name].  Congestive Heart Failure Toni [name] has gained approximately 12 lbs in the last month, even though she has been working hard to follow her diet prescription and decrease sodium. She does not take her lasix regularly due to problems making it to the bathroom with her MS. She has 1+ pitting edema, but lungs are clear to auscultation bilaterally.  Depression with emotional eating behaviors Toni [name] is on Wellbutrin SR 150 mg bid and was decreased to 200 mg qam but feels she needs the higher dose. She has XL 300 mg left and asks if she can finish these. Toni [name] struggles with emotional eating and using food for comfort to the extent that it is negatively impacting her health. She often snacks when she is not hungry. Toni [name] sometimes feels she is out of control and then feels guilty that she made poor food choices. She has been working on behavior modification techniques to help reduce her emotional eating and has been somewhat successful. She shows no sign of suicidal or homicidal ideations.  Depression screen Toni [name] 2/9 03/08/2017 04/23/2014  Decreased Interest 0 2  Down, Depressed, Hopeless 1 2  PHQ - 2 Score 1 4  Altered sleeping 2 2  Tired,  decreased energy 2 2  Change in appetite 1 1  Feeling bad or failure about yourself  0 2  Trouble concentrating 0 0  Moving slowly or fidgety/restless 0 1  Suicidal thoughts 0 1  PHQ-9 Score 6 13  Some recent data might be hidden     ALLERGIES: Allergies  Allergen Reactions   Hydrocodone Nausea And Vomiting    Hydrocodone causes vomiting   Oxycodone Nausea And Vomiting    Oral oxycodone causes vomiting   Chlorhexidine Gluconate Rash    Sores at site   Zoloft [Sertraline] Hives, Swelling and Rash    MEDICATIONS: Current Outpatient Medications on File Prior to Visit  Medication Sig Dispense Refill   acetaminophen (TYLENOL) 500 MG tablet Take 500-1,000 mg by mouth every 6 (six) hours as needed for fever or headache (or pain).     ALPRAZolam (XANAX) 0.5 MG tablet Take 1 tablet (0.5 mg total) by mouth 2 (two) times daily as needed for anxiety. 20 tablet 0   baclofen (LIORESAL) 20 MG tablet Take 1 tablet (20 mg total) by mouth 3 (three) times daily as needed for muscle spasms. (Patient taking differently: Take 20 mg by mouth See admin instructions. Once to twice a day for breakthrough spasms) 180 each 3   Biotin 10000 MCG TABS Take 1,000 mcg by mouth daily.     CALCIUM CITRATE PO Take 2 tablets by mouth 2 (two) times daily.  cetirizine (ZYRTEC) 10 MG tablet Take 10 mg by mouth daily.     Cholecalciferol (VITAMIN D) 2000 units tablet Take 1 tablet (2,000 Units total) by mouth 2 (two) times daily.  0   co-enzyme Q-10 30 MG capsule Take 30 mg by mouth daily.     corticotropin (ATHACAR H.P.) 80 UNIT/ML injectable gel Inject 80 units subcutaneously once every other day  for a total of 10 days , repeat  every 30 days. 5 mL 5   diltiazem (CARDIZEM CD) 120 MG 24 hr capsule TAKE 1 CAPSULE BY MOUTH  DAILY 90 capsule 2   docusate sodium (COLACE) 100 MG capsule Take 1 capsule (100 mg total) by mouth daily. 10 capsule 0   fluticasone (FLONASE) 50 MCG/ACT nasal spray Place 2  sprays into both nostrils daily as needed for allergies.      furosemide (LASIX) 40 MG tablet Take 2 tablets (51m) by mouth in the morning and 1 tablet (481m in the afternoon     gabapentin (NEURONTIN) 300 MG capsule TAKE 1 CAPSULE BY MOUTH UP  TO 4 TIMES DAILY 360 capsule 3   Interferon Beta-1b (BETASERON/EXTAVIA) 0.3 MG KIT injection Every other day , subcutaneus. Please titrate pt based on normal titration schedule. 1 each 12   lactulose, encephalopathy, (GENERLAC) 10 GM/15ML SOLN Take 15 mLs (10 g total) by mouth 2 (two) times daily as needed. 946 mL 5   levothyroxine (SYNTHROID, LEVOTHROID) 75 MCG tablet Take 75 mcg by mouth daily.     Multiple Vitamins-Minerals (CENTRUM SILVER PO) Take 1 tablet by mouth daily.     Nebivolol HCl (BYSTOLIC) 20 MG TABS Take 20 mg by mouth daily.     Omega-3 Fatty Acids (FISH OIL) 1000 MG CAPS Take 1,000 mg by mouth daily.     omeprazole (PRILOSEC) 20 MG capsule Take 20 mg by mouth daily.      psyllium (METAMUCIL SMOOTH TEXTURE) 28 % packet Take 1 packet by mouth every other day.     ranitidine (ZANTAC) 150 MG tablet Take 150 mg by mouth at bedtime.      simvastatin (ZOCOR) 20 MG tablet Take 10 mg by mouth at bedtime.      SYRINGE/NEEDLE, DISP, 1 ML (BD LUER-LOK SYRINGE) 25G X 5/8" 1 ML MISC Use as directed to inject  Acthar 10 each prn   Tamsulosin HCl (FLOMAX) 0.4 MG CAPS Take 0.4 mg by mouth daily.     UNABLE TO FIND Baclofen pump: Continuous     warfarin (COUMADIN) 5 MG tablet TAKE 1/2 TO 1 TABLET BY  MOUTH DAILY AS DIRECTED BY  COUMADIN CLINIC. 90 tablet 1   Current Facility-Administered Medications on File Prior to Visit  Medication Dose Route Frequency Provider Last Rate Last Dose   baclofen (LIORESAL) intrathecal injection 40 mg/2058m40 mg Intrathecal Once WilKathrynn DuckingD        PAST MEDICAL HISTORY: Past Medical History:  Diagnosis Date   Abnormality of gait 03/01/2015   Anxiety    Arthritis    "knees" (01/19/2016)     Atrial fibrillation with RVR ([facility]/31/2017   Back pain    Chest pain    Childhood asthma    Chronic pain    "nerve pain from the MS"   Constipation    Depression    DVT (deep venous thrombosis) ([id number]   "RLE; may have just been phlebitis; it was before the age of dopplers"   Edema    varicose veins with  severe venous insuff in R and L GSV' ablation of R GSV 2012   GERD (gastroesophageal reflux disease)    History of stress test 06/21/10   limited exam with some degree of breast attenuatin of the apex however a component of apical ischemia is not excluded, EF 62%; cardiac cath    HLD (hyperlipidemia)    Hx of echocardiogram 04/22/10   EF 55-60%, no valve issues   Hypertension    Hypothyroidism    Joint pain    Migraine    "none since I stopped beta blocker in 1990s" (01/19/2016)   Multiple sclerosis ([place])    has had this may 1989-[facility] reg doc   OSA on CPAP    Osteoarthritis    PAF (paroxysmal atrial fibrillation) ([place])    on coumadin; documented on monitor 06/2010   Pneumonia 11/2011   Shortness of breath    Thyroid disease     PAST SURGICAL HISTORY: Past Surgical History:  Procedure Laterality Date   ANTERIOR CERVICAL DECOMP/DISCECTOMY FUSION  01/2004   BACK SURGERY     CARDIAC CATHETERIZATION  06/22/2010   normal coronary arteries, PAF   CATARACT EXTRACTION, BILATERAL Bilateral    CHILD BIRTHS     X2   DILATION AND CURETTAGE OF UTERUS     INFUSION PUMP IMPLANTATION  ~ 2009   baclofen infusion in lower abd   PAIN PUMP REVISION N/A 07/29/2014   Procedure: Baclofen pump replacement;  Surgeon: Erline Levine, MD;  Location: [facility];  Service: Neurosurgery;  Laterality: N/A;  Baclofen pump replacement   TONSILLECTOMY AND ADENOIDECTOMY     VARICOSE VEIN SURGERY  ~ 1968   VENOUS ABLATION  12/16/2010   radiofreq ablation -Dr Elisabeth Cara and [facility]   WISDOM TOOTH EXTRACTION      SOCIAL HISTORY: Social History   Tobacco Use    Smoking status: Former Smoker    Packs/day: 0.50    Years: 7.00    Pack years: 3.50    Types: Cigarettes    Last attempt to quit: 08/21/1976    Years since quitting: 41.0   Smokeless tobacco: Never Used  Substance Use Topics   Alcohol use: Yes    Alcohol/week: 0.6 oz    Types: 1 Glasses of wine per week   Drug use: No    FAMILY HISTORY: Family History  Problem Relation Age of Onset   Coronary artery disease Father        at age 20   Hyperlipidemia Father    Thyroid disease Father    Coronary artery disease Maternal Grandmother    Depression Maternal Grandmother    Cancer Paternal Grandmother    Depression Mother    Sudden death Mother    Anxiety disorder Mother    Alcoholism Son     ROS: Review of Systems  Constitutional: Negative for weight loss.  Psychiatric/Behavioral: Positive for depression. Negative for suicidal ideas.    PHYSICAL EXAM: Blood pressure 121/82, pulse 77, temperature 98.3 F (36.8 C), temperature source Oral, height '5\' 3"'  (1.6 m), weight 240 lb (108.9 kg), SpO2 97 %. Body mass index is 42.51 kg/m. Physical Exam  Constitutional: She is oriented to person, place, and time. She appears well-developed and well-nourished.  Cardiovascular: Normal rate.  Pulmonary/Chest: Effort normal.  Lungs are clear to auscultation bilaterally  Musculoskeletal: She exhibits edema (1+ pitting edema).  Neurological: She is oriented to person, place, and time.  Skin: Skin is warm and dry.  Psychiatric: She has a normal mood and  affect. Her behavior is normal.  Vitals reviewed.   RECENT LABS AND TESTS: BMET    Component Value Date/Time   NA 139 07/08/2017 1214   NA 138 04/05/2017 1450   K 3.9 07/08/2017 1214   CL 99 (L) 07/08/2017 1214   CO2 31 07/08/2017 1214   GLUCOSE 92 07/08/2017 1214   BUN 22 (H) 07/08/2017 1214   BUN 27 04/05/2017 1450   CREATININE 0.67 07/08/2017 1214   CALCIUM 9.3 07/08/2017 1214   GFRNONAA >60 07/08/2017 1214   GFRAA  >60 07/08/2017 1214   Lab Results  Component Value Date   HGBA1C 5.4 03/08/2017   HGBA1C  04/21/2010    5.5 (NOTE)                                                                       According to the ADA Clinical Practice Recommendations for 2011, when HbA1c is used as a screening test:   >=6.5%   Diagnostic of Diabetes Mellitus           (if abnormal result  is confirmed)  5.7-6.4%   Increased risk of developing Diabetes Mellitus  References:Diagnosis and Classification of Diabetes Mellitus,Diabetes JJHE,1740,81(KGYJE 1):S62-S69 and Standards of Medical Care in         Diabetes - 2011,Diabetes Care,2011,34  (Suppl 1):S11-S61.   Lab Results  Component Value Date   INSULIN 20.4 03/08/2017   CBC    Component Value Date/Time   WBC 8.4 07/08/2017 1214   RBC 4.83 07/08/2017 1214   HGB 15.2 (H) 07/08/2017 1214   HGB 14.1 03/08/2017 1152   HCT 45.0 07/08/2017 1214   HCT 43.0 03/08/2017 1152   PLT 173 07/08/2017 1214   PLT 219 12/18/2014 1157   MCV 93.2 07/08/2017 1214   MCV 93 03/08/2017 1152   MCH 31.5 07/08/2017 1214   MCHC 33.8 07/08/2017 1214   RDW 13.3 07/08/2017 1214   RDW 13.3 03/08/2017 1152   LYMPHSABS 1.6 07/08/2017 1214   LYMPHSABS 2.2 03/08/2017 1152   MONOABS 1.3 (H) 07/08/2017 1214   EOSABS 0.2 07/08/2017 1214   EOSABS 0.1 03/08/2017 1152   BASOSABS 0.0 07/08/2017 1214   BASOSABS 0.0 03/08/2017 1152   Iron/TIBC/Ferritin/ %Sat No results found for: IRON, TIBC, FERRITIN, IRONPCTSAT Lipid Panel     Component Value Date/Time   CHOL 143 03/08/2017 1152   TRIG 89 03/08/2017 1152   HDL 57 03/08/2017 1152   CHOLHDL 3.0 04/22/2010 0536   VLDL 29 04/22/2010 0536   LDLCALC 68 03/08/2017 1152   Hepatic Function Panel     Component Value Date/Time   PROT 6.7 07/08/2017 1214   PROT 6.0 04/05/2017 1450   ALBUMIN 3.8 07/08/2017 1214   ALBUMIN 3.9 04/05/2017 1450   AST 21 07/08/2017 1214   ALT 23 07/08/2017 1214   ALKPHOS 55 07/08/2017 1214   BILITOT 0.8  07/08/2017 1214   BILITOT 0.2 04/05/2017 1450      Component Value Date/Time   TSH 2.410 03/08/2017 1152   TSH 1.083 08/06/2016 0647   TSH 1.871 01/19/2016 2302   TSH 0.206 (L) 03/10/2011 1351   TSH 1.636 06/18/2010 1430    ASSESSMENT AND PLAN: Chronic combined systolic and diastolic congestive heart failure (HCC)  Other depression - with emotional eating - Plan: buPROPion (WELLBUTRIN XL) 300 MG 24 hr tablet  Class 3 severe obesity with serious comorbidity and body mass index (BMI) of 40.0 to 44.9 in adult, unspecified obesity type ([place])  PLAN:  Congestive Heart Failure Ceil was encouraged to take full dose of lasix for 3 days and will return to our clinic in 2 weeks to re-evaluate. Toni [name] is to call if shortness of breath develops.  Depression with Emotional Eating Behaviors We discussed behavior modification techniques today to help Toni [name] deal with her emotional eating and depression. She has agreed to change back to Wellbutrin XL 300 mg (no refill needed) and will follow up in 2 weeks to reassess.  Obesity  Toni [name] is currently in the action stage of change. As such, her goal is to continue with weight loss efforts She has agreed to follow the Category 2 plan Toni [name] has been instructed to work up to a goal of 150 minutes of combined cardio and strengthening exercise per week for weight loss and overall health benefits. We discussed the following Behavioral Modification Strategies today: no skipping meals, planning for success, increasing vegetables and decreasing sodium intake  Toni [name] has agreed to follow up with our clinic in 2 weeks. She was informed of the importance of frequent follow up visits to maximize her success with intensive lifestyle modifications for her multiple health conditions.     OBESITY BEHAVIORAL INTERVENTION VISIT  Today's visit was # 8 out of 22.  Starting weight: 257 lbs Starting date: 03/08/17 Today's weight : 240  lbs Today's date: 08/23/2017 Total lbs lost to date: 17 (Patients must lose 7 lbs in the first 6 months to continue with counseling)   ASK: We discussed the diagnosis of obesity with [name] today and Hadlei agreed to give [name] permission to discuss obesity behavioral modification therapy today.  ASSESS: Michayla has the diagnosis of obesity and her BMI today is 42.52 Astella is in the action stage of change   ADVISE: Adrie was educated on the multiple health risks of obesity as well as the benefit of weight loss to improve her health. She was advised of the need for long term treatment and the importance of lifestyle modifications.  AGREE: Multiple dietary modification options and treatment options were discussed and  Kenli agreed to follow the Category 2 plan We discussed the following Behavioral Modification Strategies today: no skipping meals, planning for success, increasing vegetables and decreasing sodium intake  I, Doreene Nest, am acting as transcriptionist for Dennard Nip, MD  I have reviewed the above documentation for accuracy and completeness, and I agree with the above. -Dennard Nip, MD

## 2017-08-27 ENCOUNTER — Telehealth (INDEPENDENT_AMBULATORY_CARE_PROVIDER_SITE_OTHER): Payer: Self-pay | Admitting: Orthopaedic Surgery

## 2017-08-27 ENCOUNTER — Other Ambulatory Visit: Payer: Self-pay | Admitting: Neurology

## 2017-08-27 NOTE — Telephone Encounter (Signed)
Patient scheduled 09/05/17 at 2:30pm with Dr Ninfa Linden

## 2017-08-27 NOTE — Telephone Encounter (Signed)
Patient called asked when can she come in for another injection. The number to contact patient is [phone]

## 2017-08-27 NOTE — Telephone Encounter (Signed)
If it's just another cortisone injection, than yes that's fine

## 2017-08-28 ENCOUNTER — Telehealth: Payer: Self-pay | Admitting: Neurology

## 2017-08-28 NOTE — Telephone Encounter (Signed)
PA completed and sent to [facility]. KEY [id number]. Please allow 72 hours for a response.

## 2017-08-29 ENCOUNTER — Ambulatory Visit: Payer: Medicare Other | Admitting: Neurology

## 2017-08-29 ENCOUNTER — Encounter: Payer: Self-pay | Admitting: Neurology

## 2017-08-29 NOTE — Telephone Encounter (Signed)
Received a denial at this time for the medication. We will start the appeals process. This may take up to 30 days before getting a response.

## 2017-08-31 ENCOUNTER — Other Ambulatory Visit: Payer: Self-pay | Admitting: Adult Health

## 2017-08-31 DIAGNOSIS — G4733 Obstructive sleep apnea (adult) (pediatric): Secondary | ICD-10-CM

## 2017-08-31 NOTE — Addendum Note (Signed)
Addended by: Trudie Buckler on: 08/31/2017 10:52 AM   Modules accepted: Orders

## 2017-08-31 NOTE — Telephone Encounter (Signed)
Order signed and given to [place], [place].

## 2017-08-31 NOTE — Telephone Encounter (Signed)
Pt has called re: [facility] informing her that on the 2nd of January they sent a fax to Dr Brett Fairy for the ordering of pt's Bypap supplies, there has been no response received back.  Pt states they will not send anything out until they have received a response to fax sent re: supplies needed.  Pt is asking for a call on the status of the fax being responded to due to her need of supplies

## 2017-09-04 ENCOUNTER — Ambulatory Visit (INDEPENDENT_AMBULATORY_CARE_PROVIDER_SITE_OTHER): Payer: Medicare Other | Admitting: Pharmacist

## 2017-09-04 DIAGNOSIS — I482 Chronic atrial fibrillation: Secondary | ICD-10-CM

## 2017-09-04 DIAGNOSIS — Z7901 Long term (current) use of anticoagulants: Secondary | ICD-10-CM | POA: Diagnosis not present

## 2017-09-04 DIAGNOSIS — I4821 Permanent atrial fibrillation: Secondary | ICD-10-CM

## 2017-09-04 LAB — POCT INR: INR: 1.8

## 2017-09-05 ENCOUNTER — Ambulatory Visit (INDEPENDENT_AMBULATORY_CARE_PROVIDER_SITE_OTHER): Payer: Medicare Other | Admitting: Orthopaedic Surgery

## 2017-09-05 NOTE — Telephone Encounter (Signed)
PA approved the appeal for the Acthar from 08/28/2017-08/20/2018. # [id number]

## 2017-09-06 ENCOUNTER — Ambulatory Visit (INDEPENDENT_AMBULATORY_CARE_PROVIDER_SITE_OTHER): Payer: Medicare Other | Admitting: Family Medicine

## 2017-09-06 ENCOUNTER — Telehealth: Payer: Self-pay | Admitting: Internal Medicine

## 2017-09-06 VITALS — BP 103/41 | HR 52 | Temp 98.0°F | Ht 63.0 in | Wt 225.0 lb

## 2017-09-06 DIAGNOSIS — I5042 Chronic combined systolic (congestive) and diastolic (congestive) heart failure: Secondary | ICD-10-CM | POA: Diagnosis not present

## 2017-09-06 DIAGNOSIS — E119 Type 2 diabetes mellitus without complications: Secondary | ICD-10-CM | POA: Diagnosis not present

## 2017-09-06 DIAGNOSIS — I509 Heart failure, unspecified: Secondary | ICD-10-CM | POA: Diagnosis not present

## 2017-09-06 DIAGNOSIS — Z6839 Body mass index (BMI) 39.0-39.9, adult: Secondary | ICD-10-CM | POA: Diagnosis not present

## 2017-09-06 DIAGNOSIS — I110 Hypertensive heart disease with heart failure: Secondary | ICD-10-CM | POA: Diagnosis not present

## 2017-09-06 DIAGNOSIS — I480 Paroxysmal atrial fibrillation: Secondary | ICD-10-CM | POA: Diagnosis not present

## 2017-09-06 DIAGNOSIS — G35 Multiple sclerosis: Secondary | ICD-10-CM | POA: Diagnosis not present

## 2017-09-06 DIAGNOSIS — M48062 Spinal stenosis, lumbar region with neurogenic claudication: Secondary | ICD-10-CM | POA: Diagnosis not present

## 2017-09-06 NOTE — Telephone Encounter (Signed)
Pt made aware Dr. Debara Pickett okay for her to decrease her Bystolic to 10 mg. She is going to check her BP and HR daily and bring a copy to his nurse when she come for her 1/29 INR check. She is to call our office back if she has any problems, she verbalized understanding no additional questions at this time.

## 2017-09-06 NOTE — Telephone Encounter (Signed)
Yes - I agree with decreasing the Bystolic to 10 mg daily with recent weight loss.  Dr. Lemmie Evens

## 2017-09-06 NOTE — Telephone Encounter (Signed)
Pt has experience lower HR when she checks them at home. Her HR has ranged from 48-65 with BP's 381-77 and diastolic 11-65. She does get lightheaded at time which is usually when HR in 40's. This has only happened a few times, she has not had any palpitations, chest pain or pressure. She is worried that with her recent 67 LB weight loss since Sept. 2018 her medications need to be adjusted. She is taking Bystolic 20 mg QD and Diltiazem 120 mg QD both of which she takes in the morning. She wonders if she can decrease her Bystolic to 10 mg daily. Told her would send to Dr. Debara Pickett and his nurse someone will return her call.  She does state that she does not take her Lasix 80 mg in the morning and 40 mg afternoon, regularly because of her need to have appointments and getting to the bathroom.

## 2017-09-06 NOTE — Progress Notes (Signed)
Office: [phone]  /  Fax: [phone]   HPI:   Chief Complaint: OBESITY Toni [name] is here to discuss her progress with her obesity treatment plan. She is on the Category 2 plan and is following her eating plan approximately 99 % of the time. She states she is doing physical therapy and walking for 20 minutes 4 times per week. Toni [name] continues to do well with weight loss on Category 2 plan. She doesn't like to journal as she feels this is obsessive.  Her weight is 225 lb (102.1 kg) today and has had a weight loss of 15 pounds over a period of 2 weeks since her last visit. She has lost 32 lbs since starting treatment with [name].  Congestive Heart Failure Toni [name] has congestive heart failure. She has diuresed well, she is taking Lasix more regularly and doing well decreasing sodium in her diet. She is eating out less, which is helpful.  ALLERGIES: Allergies  Allergen Reactions  . Hydrocodone Nausea And Vomiting    Hydrocodone causes vomiting  . Oxycodone Nausea And Vomiting    Oral oxycodone causes vomiting  . Chlorhexidine Gluconate Rash    Sores at site  . Zoloft [Sertraline] Hives, Swelling and Rash    MEDICATIONS: Current Outpatient Medications on File Prior to Visit  Medication Sig Dispense Refill  . acetaminophen (TYLENOL) 500 MG tablet Take 500-1,000 mg by mouth every 6 (six) hours as needed for fever or headache (or pain).    Marland Kitchen ALPRAZolam (XANAX) 0.5 MG tablet Take 1 tablet (0.5 mg total) by mouth 2 (two) times daily as needed for anxiety. 20 tablet 0  . baclofen (LIORESAL) 20 MG tablet Take 1 tablet (20 mg total) by mouth 3 (three) times daily as needed for muscle spasms. (Patient taking differently: Take 20 mg by mouth See admin instructions. Once to twice a day for breakthrough spasms) 180 each 3  . Biotin 10000 MCG TABS Take 1,000 mcg by mouth daily.    Marland Kitchen buPROPion (WELLBUTRIN XL) 300 MG 24 hr tablet Take 1 tablet (300 mg total) by mouth daily. 30 tablet 0  .  CALCIUM CITRATE PO Take 2 tablets by mouth 2 (two) times daily.    . cetirizine (ZYRTEC) 10 MG tablet Take 10 mg by mouth daily.    . Cholecalciferol (VITAMIN D) 2000 units tablet Take 1 tablet (2,000 Units total) by mouth 2 (two) times daily.  0  . co-enzyme Q-10 30 MG capsule Take 30 mg by mouth daily.    Marland Kitchen diltiazem (CARDIZEM CD) 120 MG 24 hr capsule TAKE 1 CAPSULE BY MOUTH  DAILY 90 capsule 2  . docusate sodium (COLACE) 100 MG capsule Take 1 capsule (100 mg total) by mouth daily. 10 capsule 0  . fluticasone (FLONASE) 50 MCG/ACT nasal spray Place 2 sprays into both nostrils daily as needed for allergies.     . furosemide (LASIX) 40 MG tablet Take 2 tablets (80mg ) by mouth in the morning and 1 tablet (40mg ) in the afternoon    . gabapentin (NEURONTIN) 300 MG capsule TAKE 1 CAPSULE BY MOUTH UP  TO 4 TIMES DAILY 360 capsule 3  . HP ACTHAR 80 UNIT/ML injectable gel INJECT 80 UNITS (1ML) SUBCUTANEOUSLY EVERY OTHER DAY FOR A TOTAL OF 10 DAYS. REPEAT EVERY 30 DAYS. 5 mL 5  . lactulose, encephalopathy, (GENERLAC) 10 GM/15ML SOLN Take 15 mLs (10 g total) by mouth 2 (two) times daily as needed. 946 mL 5  . levothyroxine (SYNTHROID, LEVOTHROID) 75 MCG tablet Take  75 mcg by mouth daily.    . Multiple Vitamins-Minerals (CENTRUM SILVER PO) Take 1 tablet by mouth daily.    . Nebivolol HCl (BYSTOLIC) 20 MG TABS Take 20 mg by mouth daily.    . Omega-3 Fatty Acids (FISH OIL) 1000 MG CAPS Take 1,000 mg by mouth daily.    Marland Kitchen omeprazole (PRILOSEC) 20 MG capsule Take 20 mg by mouth daily.     . psyllium (METAMUCIL SMOOTH TEXTURE) 28 % packet Take 1 packet by mouth every other day.    . ranitidine (ZANTAC) 150 MG tablet Take 150 mg by mouth at bedtime.     . simvastatin (ZOCOR) 20 MG tablet Take 10 mg by mouth at bedtime.     . SYRINGE/NEEDLE, DISP, 1 ML (BD LUER-LOK SYRINGE) 25G X 5/8" 1 ML MISC Use as directed to inject  Acthar 10 each prn  . Tamsulosin HCl (FLOMAX) 0.4 MG CAPS Take 0.4 mg by mouth daily.    Marland Kitchen  UNABLE TO FIND Baclofen pump: Continuous    . warfarin (COUMADIN) 5 MG tablet TAKE 1/2 TO 1 TABLET BY  MOUTH DAILY AS DIRECTED BY  COUMADIN CLINIC. 90 tablet 1   Current Facility-Administered Medications on File Prior to Visit  Medication Dose Route Frequency Provider Last Rate Last Dose  . baclofen (LIORESAL) intrathecal injection 40 mg/16mL  40 mg Intrathecal Once Kathrynn Ducking, MD        PAST MEDICAL HISTORY: Past Medical History:  Diagnosis Date  . Abnormality of gait 03/01/2015  . Anxiety   . Arthritis    "knees" (01/19/2016)  . Atrial fibrillation with RVR ([place]) 01/19/2016  . Back pain   . Chest pain   . Childhood asthma   . Chronic pain    "nerve pain from the MS"  . Constipation   . Depression   . DVT (deep venous thrombosis) ([place]) 1983   "RLE; may have just been phlebitis; it was before the age of dopplers"  . 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 GERD (gastroesophageal reflux disease)   . History of stress test 06/21/10   limited exam with some degree of breast attenuatin of the apex however a component of apical ischemia is not excluded, EF 62%; cardiac cath   . HLD (hyperlipidemia)   . Hx of echocardiogram 04/22/10   EF 55-60%, no valve issues  . Hypertension   . Hypothyroidism   . Joint pain   . Migraine    "none since I stopped beta blocker in 1990s" (01/19/2016)  . Multiple sclerosis ([place])    has had this may 1989-[facility] reg doc  . OSA on CPAP   . Osteoarthritis   . PAF (paroxysmal atrial fibrillation) ([place])    on coumadin; documented on monitor 06/2010  . Pneumonia 11/2011  . Shortness of breath   . Thyroid disease     PAST SURGICAL HISTORY: Past Surgical History:  Procedure Laterality Date  . ANTERIOR CERVICAL DECOMP/DISCECTOMY FUSION  01/2004  . BACK SURGERY    . CARDIAC CATHETERIZATION  06/22/2010   normal coronary arteries, PAF  . CATARACT EXTRACTION, BILATERAL Bilateral   . CHILD BIRTHS       X2  . DILATION AND CURETTAGE OF UTERUS    . INFUSION PUMP IMPLANTATION  ~ 2009   baclofen infusion in lower abd  . PAIN PUMP REVISION N/A 07/29/2014   Procedure: Baclofen pump replacement;  Surgeon:  Erline Levine, MD;  Location: [facility];  Service: Neurosurgery;  Laterality: N/A;  Baclofen pump replacement  . TONSILLECTOMY AND ADENOIDECTOMY    . VARICOSE VEIN SURGERY  ~ 1968  . VENOUS ABLATION  12/16/2010   radiofreq ablation -Dr Elisabeth Cara and [facility]  . WISDOM TOOTH EXTRACTION      SOCIAL HISTORY: Social History   Tobacco Use  . Smoking status: Former Smoker    Packs/day: 0.50    Years: 7.00    Pack years: 3.50    Types: Cigarettes    Last attempt to quit: 08/21/1976    Years since quitting: 41.0  . Smokeless tobacco: Never Used  Substance Use Topics  . Alcohol use: Yes    Alcohol/week: 0.6 oz    Types: 1 Glasses of wine per week  . Drug use: No    FAMILY HISTORY: Family History  Problem Relation Age of Onset  . Coronary artery disease Father        at age 7  . Hyperlipidemia Father   . Thyroid disease Father   . Coronary artery disease Maternal Grandmother   . Depression Maternal Grandmother   . Cancer Paternal Grandmother   . Depression Mother   . Sudden death Mother   . Anxiety disorder Mother   . Alcoholism Son     ROS: Review of Systems  Constitutional: Positive for weight loss.    PHYSICAL EXAM: Blood pressure (!) 103/41, pulse (!) 52, temperature 98 F (36.7 C), temperature source Oral, height 5\' 3"  (1.6 m), weight 225 lb (102.1 kg), SpO2 97 %. Body mass index is 39.86 kg/m. Physical Exam  Constitutional: She is oriented to person, place, and time. She appears well-developed and well-nourished.  Cardiovascular: Normal rate.  Pulmonary/Chest: Effort normal.  Musculoskeletal: Normal range of motion.  Neurological: She is oriented to person, place, and time.  Skin: Skin is warm and dry.  Psychiatric: She has a normal mood and affect. Her behavior  is normal.  Vitals reviewed.   RECENT LABS AND TESTS: BMET    Component Value Date/Time   NA 139 07/08/2017 1214   NA 138 04/05/2017 1450   K 3.9 07/08/2017 1214   CL 99 (L) 07/08/2017 1214   CO2 31 07/08/2017 1214   GLUCOSE 92 07/08/2017 1214   BUN 22 (H) 07/08/2017 1214   BUN 27 04/05/2017 1450   CREATININE 0.67 07/08/2017 1214   CALCIUM 9.3 07/08/2017 1214   GFRNONAA >60 07/08/2017 1214   GFRAA >60 07/08/2017 1214   Lab Results  Component Value Date   HGBA1C 5.4 03/08/2017   HGBA1C  04/21/2010    5.5 (NOTE)                                                                       According to the ADA Clinical Practice Recommendations for 2011, when HbA1c is used as a screening test:   >=6.5%   Diagnostic of Diabetes Mellitus           (if abnormal result  is confirmed)  5.7-6.4%   Increased risk of developing Diabetes Mellitus  References:Diagnosis and Classification of Diabetes Mellitus,Diabetes BJYN,8295,62(ZHYQM 1):S62-S69 and Standards of Medical Care in         Diabetes - 2011,Diabetes  XQJJ,9417,40  (Suppl 1):S11-S61.   Lab Results  Component Value Date   INSULIN 20.4 03/08/2017   CBC    Component Value Date/Time   WBC 8.4 07/08/2017 1214   RBC 4.83 07/08/2017 1214   HGB 15.2 (H) 07/08/2017 1214   HGB 14.1 03/08/2017 1152   HCT 45.0 07/08/2017 1214   HCT 43.0 03/08/2017 1152   PLT 173 07/08/2017 1214   PLT 219 12/18/2014 1157   MCV 93.2 07/08/2017 1214   MCV 93 03/08/2017 1152   MCH 31.5 07/08/2017 1214   MCHC 33.8 07/08/2017 1214   RDW 13.3 07/08/2017 1214   RDW 13.3 03/08/2017 1152   LYMPHSABS 1.6 07/08/2017 1214   LYMPHSABS 2.2 03/08/2017 1152   MONOABS 1.3 (H) 07/08/2017 1214   EOSABS 0.2 07/08/2017 1214   EOSABS 0.1 03/08/2017 1152   BASOSABS 0.0 07/08/2017 1214   BASOSABS 0.0 03/08/2017 1152   Iron/TIBC/Ferritin/ %Sat No results found for: IRON, TIBC, FERRITIN, IRONPCTSAT Lipid Panel     Component Value Date/Time   CHOL 143 03/08/2017 1152    TRIG 89 03/08/2017 1152   HDL 57 03/08/2017 1152   CHOLHDL 3.0 04/22/2010 0536   VLDL 29 04/22/2010 0536   LDLCALC 68 03/08/2017 1152   Hepatic Function Panel     Component Value Date/Time   PROT 6.7 07/08/2017 1214   PROT 6.0 04/05/2017 1450   ALBUMIN 3.8 07/08/2017 1214   ALBUMIN 3.9 04/05/2017 1450   AST 21 07/08/2017 1214   ALT 23 07/08/2017 1214   ALKPHOS 55 07/08/2017 1214   BILITOT 0.8 07/08/2017 1214   BILITOT 0.2 04/05/2017 1450      Component Value Date/Time   TSH 2.410 03/08/2017 1152   TSH 1.083 08/06/2016 0647   TSH 1.871 01/19/2016 2302   TSH 0.206 (L) 03/10/2011 1351   TSH 1.636 06/18/2010 1430    ASSESSMENT AND PLAN: Chronic combined systolic and diastolic congestive heart failure (HCC)  Class 2 severe obesity with serious comorbidity and body mass index (BMI) of 39.0 to 39.9 in adult, unspecified obesity type (HCC)  PLAN:  Congestive Heart Failure Toni [name] agrees to continue taking Lasix most days of the week and work on diet, exercise, and weight loss to help preserve cardiac function. Toni [name] agrees to follow up with our clinic in 2 to 3 weeks.  We spent > than 50% of the 15 minute visit on the counseling as documented in the note.  Obesity Toni [name] is currently in the action stage of change. As such, her goal is to continue with weight loss efforts She has agreed to follow the Category 2 plan Toni [name] has been instructed to work up to a goal of 150 minutes of combined cardio and strengthening exercise per week for weight loss and overall health benefits. We discussed the following Behavioral Modification Strategies today: increasing lean protein intake, decreasing sodium intake, no skipping meals, and better snacking choices   Toni [name] has agreed to follow up with our clinic in 2 to 3 weeks. She was informed of the importance of frequent follow up visits to maximize her success with intensive lifestyle modifications for her multiple health  conditions.   OBESITY BEHAVIORAL INTERVENTION VISIT  Today's visit was # 9 out of 22.  Starting weight: 257 lbs Starting date: 03/08/17 Today's weight : 225 lbs  Today's date: 09/06/2017 Total lbs lost to date: 50 (Patients must lose 7 lbs in the first 6 months to continue with counseling)   ASK: We discussed the diagnosis of obesity  with Toni [name] today and Toni [name] agreed to give [name] permission to discuss obesity behavioral modification therapy today.  ASSESS: Mayley has the diagnosis of obesity and her BMI today is 39.87 Toni [name] is in the action stage of change   ADVISE: Toni [name] was educated on the multiple health risks of obesity as well as the benefit of weight loss to improve her health. She was advised of the need for long term treatment and the importance of lifestyle modifications.  AGREE: Multiple dietary modification options and treatment options were discussed and  Toni [name] agreed to the above obesity treatment plan.  I, Trixie Dredge, am acting as transcriptionist for Dennard Nip, MD  I have reviewed the above documentation for accuracy and completeness, and I agree with the above. -Dennard Nip, MD

## 2017-09-06 NOTE — Telephone Encounter (Signed)
New message      STAT if HR is under 50 or over 120 (normal HR is 60-100 beats per minute)  1) What is your heart rate?  52   2) Do you have a log of your heart rate readings (document readings)? 48-65 the last couple days   3) Do you have any other symptoms?  Patient thinks she needs a medication adjustment , she has lost about 45lbs

## 2017-09-11 ENCOUNTER — Ambulatory Visit: Payer: Medicare Other

## 2017-09-13 ENCOUNTER — Encounter (INDEPENDENT_AMBULATORY_CARE_PROVIDER_SITE_OTHER): Payer: Self-pay | Admitting: Orthopaedic Surgery

## 2017-09-13 ENCOUNTER — Ambulatory Visit (INDEPENDENT_AMBULATORY_CARE_PROVIDER_SITE_OTHER): Payer: Medicare Other | Admitting: Orthopaedic Surgery

## 2017-09-13 DIAGNOSIS — G5602 Carpal tunnel syndrome, left upper limb: Secondary | ICD-10-CM

## 2017-09-13 DIAGNOSIS — G5601 Carpal tunnel syndrome, right upper limb: Secondary | ICD-10-CM | POA: Insufficient documentation

## 2017-09-13 MED ORDER — LIDOCAINE HCL 1 % IJ SOLN
1.0000 mL | INTRAMUSCULAR | Status: AC | PRN
  Administered 2017-09-13: 1 mL

## 2017-09-13 MED ORDER — METHYLPREDNISOLONE ACETATE 40 MG/ML IJ SUSP
40.0000 mg | INTRAMUSCULAR | Status: AC | PRN
  Administered 2017-09-13: 40 mg

## 2017-09-13 NOTE — Progress Notes (Signed)
Office Visit Note   Patient: Toni [name]           Date of Birth: 1945/10/09           MRN: [id number] Visit Date: 09/13/2017              Requested by: Prince Solian, MD [address] [place], [place] [address] PCP: Prince Solian, MD   Assessment & Plan: Visit Diagnoses:  1. Carpal tunnel syndrome, left upper limb   2. Carpal tunnel syndrome, right upper limb     Plan: At this point we will try another injection in transverse carpal ligament on the left side her wishes.  I did let her know that we can perform a carpal tunnel release under light sedation and local anesthesia and she was encouraged about potentially trying this in the future.  I talked her about this in detail and the risk and benefits were explained as well.  If he gets to where she wants to try the surgery we would set it up as an outpatient at probably [facility].  She is on Coumadin but most likely would not have her stop the Coumadin for this procedure.  Follow-Up Instructions: Return if symptoms worsen or fail to improve.   Orders:  No orders of the defined types were placed in this encounter.  No orders of the defined types were placed in this encounter.     Procedures: Hand/UE Inj: L carpal tunnel for carpal tunnel syndrome on 09/13/2017 2:06 PM Medications: 1 mL lidocaine 1 %; 40 mg methylPREDNISolone acetate 40 MG/ML      Clinical Data: No additional findings.   Subjective: Chief Complaint  Patient presents with  . Left Wrist - Pain  Patient is well-known to [name]  She has significant carpal tunnel syndrome involving her left upper extremity.  She also has multiple sclerosis.  She is on Neurontin mainly wheelchair-bound.  She wonders if a component of her burning symptoms neuropathy from her MS however her main numbness is in the median nerve distribution.  She says is a little bit on the right but not extensively it is on the left side.  She is requesting a steroid injection  in the left transverse carpal ligament.  She has had this in the remote past which is been over 6 months at least.  HPI  Review of Systems She currently denies any headache, chest pain, shortness of breath, fever, chills, nausea, vomiting.  Objective: Vital Signs: There were no vitals taken for this visit.  Physical Exam She is alert and oriented x3 and in no acute distress Ortho Exam Examination of her left and right hand shows she has problems with full supination.  She does have positive Phalen's and Tinel's exam bilaterally and numbness in the median nerve distribution bilaterally.  She has significant weak pinch and grip strength on the left than the right. Specialty Comments:  No specialty comments available.  Imaging: No results found.   PMFS History: Patient Active Problem List   Diagnosis Date Noted  . Carpal tunnel syndrome, left upper limb 09/13/2017  . Carpal tunnel syndrome, right upper limb 09/13/2017  . Other insomnia 06/11/2017  . Depression 06/11/2017  . Abdominal swelling 03/29/2017  . Central apnea 03/26/2017  . Class 3 obesity with serious comorbidity and body mass index (BMI) of 40.0 to 44.9 in adult 03/22/2017  . Vitamin D deficiency 03/22/2017  . Prediabetes 03/22/2017  . Venous insufficiency 02/14/2017  . Right hand  weakness 08/06/2016  . Nonischemic cardiomyopathy ([place]) 05/09/2016  . Localized edema 05/09/2016  . Long term current use of anticoagulant therapy 02/03/2016  . Combined systolic and diastolic congestive heart failure ([place]) 01/19/2016  . Atrial fibrillation with RVR ([place]) 01/19/2016  . 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 Obesity hypoventilation syndrome ([place]) 03/30/2015  . Abnormality of gait 03/01/2015  . Chronic constipation with overflow incontinence 01/25/2015  . 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 Closed right ankle fracture 10/30/2013  . Leucocytosis 10/30/2013  . Varicose veins of both lower extremities with complications 70/96/2836  . OSA on CPAP 06/04/2013  . Muscle spasms of lower extremity 02/24/2013  . Long term (current) use of anticoagulants 11/05/2012  . 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 Aspiration pneumonia vs Failed outpatient Rx of CAP 11/26/2011  . Hypertension   . Thyroid disease   . HLD (hyperlipidemia)    Past Medical History:  Diagnosis Date  . Abnormality of gait 03/01/2015  . Anxiety   . Arthritis    "knees" (01/19/2016)  . Atrial fibrillation with RVR ([place]) 01/19/2016  . Back pain   . Chest pain   . Childhood asthma   . Chronic pain    "nerve pain from the MS"  . Constipation   . Depression   . DVT (deep venous thrombosis) ([place]) 1983   "RLE; may have just been phlebitis; it was before the age of dopplers"  . 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 GERD (gastroesophageal reflux disease)   . History of stress test 06/21/10   limited exam with some degree of breast attenuatin of the apex however a component of apical ischemia is not excluded, EF 62%; cardiac cath   . HLD (hyperlipidemia)   . Hx of echocardiogram 04/22/10   EF 55-60%, no valve issues  . Hypertension   . Hypothyroidism   . Joint pain   . Migraine    "none since I stopped beta blocker in 1990s" (01/19/2016)  . Multiple sclerosis ([place])    has had this may 1989-[facility] reg doc  . OSA on CPAP   . Osteoarthritis   . PAF (paroxysmal atrial fibrillation) ([place])    on coumadin; documented on monitor 06/2010  . Pneumonia 11/2011  . Shortness of breath   . Thyroid disease     Family History  Problem Relation Age of Onset  . Coronary artery disease Father        at age 31  . Hyperlipidemia Father   . Thyroid disease Father   . Coronary  artery disease Maternal Grandmother   . Depression Maternal Grandmother   . Cancer Paternal Grandmother   . Depression Mother   . Sudden death Mother   . Anxiety disorder Mother   . Alcoholism Son     Past Surgical History:  Procedure Laterality Date  . ANTERIOR CERVICAL DECOMP/DISCECTOMY FUSION  01/2004  . BACK SURGERY    . CARDIAC CATHETERIZATION  06/22/2010   normal coronary arteries, PAF  . CATARACT EXTRACTION, BILATERAL Bilateral   . CHILD BIRTHS     X2  . DILATION AND CURETTAGE OF UTERUS    . INFUSION PUMP IMPLANTATION  ~ 2009   baclofen infusion in lower abd  .  PAIN PUMP REVISION N/A 07/29/2014   Procedure: Baclofen pump replacement;  Surgeon: Erline Levine, MD;  Location: [facility];  Service: Neurosurgery;  Laterality: N/A;  Baclofen pump replacement  . TONSILLECTOMY AND ADENOIDECTOMY    . VARICOSE VEIN SURGERY  ~ 1968  . VENOUS ABLATION  12/16/2010   radiofreq ablation -[facility]abeth [facility]  . WISDOM TOOTH EXTRACTION     Social History   Occupational History  . Occupation: RETIRED  Tobacco Use  . Smoking status: Former Smoker    Packs/day: 0.50    Years: 7.00    Pack years: 3.50    Types: Cigarettes    Last attempt to quit: 08/21/1976    Years since quitting: 41.0  . Smokeless tobacco: Never Used  Substance and Sexual Activity  . Alcohol use: Yes    Alcohol/week: 0.6 oz    Types: 1 Glasses of wine per week  . Drug use: No  . Sexual activity: Not Currently

## 2017-09-18 ENCOUNTER — Ambulatory Visit (INDEPENDENT_AMBULATORY_CARE_PROVIDER_SITE_OTHER): Payer: Medicare Other | Admitting: Pharmacist Clinician (PhC)/ Clinical Pharmacy Specialist

## 2017-09-18 ENCOUNTER — Telehealth: Payer: Self-pay | Admitting: Pharmacist Clinician (PhC)/ Clinical Pharmacy Specialist

## 2017-09-18 DIAGNOSIS — I4821 Permanent atrial fibrillation: Secondary | ICD-10-CM

## 2017-09-18 DIAGNOSIS — I482 Chronic atrial fibrillation: Secondary | ICD-10-CM | POA: Diagnosis not present

## 2017-09-18 DIAGNOSIS — Z7901 Long term (current) use of anticoagulants: Secondary | ICD-10-CM | POA: Diagnosis not present

## 2017-09-18 LAB — POCT INR: INR: 1.9

## 2017-09-18 NOTE — Patient Instructions (Signed)
Description   Increase dose to 1 tablet daily, repeat INR in 2 weeks

## 2017-09-18 NOTE — Telephone Encounter (Signed)
Patient in office today for INR check.  Was recently told to decrease Bystolic from 20 mg to 10 mg daily d/t low heart rate.   Since then she states her blood pressure has been elevated.  Home readings brought in show only one reading > 241 systolic (753/01).  Checked pressure in office today, 124/70 with heart rate of 64.  Advised patient to continue with current regimen and bring her home cuff to her next INR check for [name] to determine validity.

## 2017-09-22 DIAGNOSIS — M48062 Spinal stenosis, lumbar region with neurogenic claudication: Secondary | ICD-10-CM | POA: Diagnosis not present

## 2017-09-22 DIAGNOSIS — I480 Paroxysmal atrial fibrillation: Secondary | ICD-10-CM | POA: Diagnosis not present

## 2017-09-22 DIAGNOSIS — E119 Type 2 diabetes mellitus without complications: Secondary | ICD-10-CM | POA: Diagnosis not present

## 2017-09-22 DIAGNOSIS — I110 Hypertensive heart disease with heart failure: Secondary | ICD-10-CM | POA: Diagnosis not present

## 2017-09-22 DIAGNOSIS — G35 Multiple sclerosis: Secondary | ICD-10-CM | POA: Diagnosis not present

## 2017-09-22 DIAGNOSIS — I509 Heart failure, unspecified: Secondary | ICD-10-CM | POA: Diagnosis not present

## 2017-09-24 ENCOUNTER — Other Ambulatory Visit: Payer: Self-pay | Admitting: Internal Medicine

## 2017-09-24 DIAGNOSIS — G35 Multiple sclerosis: Secondary | ICD-10-CM | POA: Diagnosis not present

## 2017-09-24 DIAGNOSIS — I872 Venous insufficiency (chronic) (peripheral): Secondary | ICD-10-CM

## 2017-09-24 DIAGNOSIS — M48062 Spinal stenosis, lumbar region with neurogenic claudication: Secondary | ICD-10-CM | POA: Diagnosis not present

## 2017-09-24 DIAGNOSIS — I509 Heart failure, unspecified: Secondary | ICD-10-CM | POA: Diagnosis not present

## 2017-09-24 DIAGNOSIS — I110 Hypertensive heart disease with heart failure: Secondary | ICD-10-CM | POA: Diagnosis not present

## 2017-09-24 DIAGNOSIS — E119 Type 2 diabetes mellitus without complications: Secondary | ICD-10-CM | POA: Diagnosis not present

## 2017-09-24 DIAGNOSIS — R600 Localized edema: Secondary | ICD-10-CM

## 2017-09-24 DIAGNOSIS — I480 Paroxysmal atrial fibrillation: Secondary | ICD-10-CM | POA: Diagnosis not present

## 2017-09-24 NOTE — Telephone Encounter (Signed)
Rx(s) sent to pharmacy electronically.  

## 2017-09-25 ENCOUNTER — Telehealth: Payer: Self-pay | Admitting: Internal Medicine

## 2017-09-25 ENCOUNTER — Ambulatory Visit (INDEPENDENT_AMBULATORY_CARE_PROVIDER_SITE_OTHER): Payer: Medicare Other | Admitting: Family Medicine

## 2017-09-25 VITALS — BP 141/79 | HR 77 | Temp 99.2°F | Ht 63.0 in | Wt 227.0 lb

## 2017-09-25 DIAGNOSIS — Z6841 Body Mass Index (BMI) 40.0 and over, adult: Secondary | ICD-10-CM | POA: Diagnosis not present

## 2017-09-25 DIAGNOSIS — K5909 Other constipation: Secondary | ICD-10-CM | POA: Diagnosis not present

## 2017-09-25 MED ORDER — NEBIVOLOL HCL 20 MG PO TABS: 20.0000 mg | ORAL_TABLET | Freq: Every day | ORAL | Status: DC

## 2017-09-25 NOTE — Telephone Encounter (Signed)
New message  Pt verbalized that she is calling for the RN  Pt c/o BP issue: STAT if pt c/o blurred vision, one-sided weakness or slurred speech  1. What are your last 5 BP readings? 143/95  152/88      2. Are you having any other symptoms (ex. Dizziness, headache, blurred vision, passed out)? Nebivolol HCl (BYSTOLIC) 20 MG TABS  3. What is your BP issue? Nebivolol HCl (BYSTOLIC) 20 MG TABS pt verbalized that she will go back to her full dose 20mg 

## 2017-09-25 NOTE — Telephone Encounter (Signed)
Returned call to patient of Dr. Debara Pickett. She reports her BP has "sky-rocketed" since decreasing bystolic from 20mg  to 10mg  - see phone note Jan 29. She decreased dose d/t low HR. Prior to decreasing dose, her HR was in the high 40s to mid 50s and would go up to 60s. Her HR is running in the 70s during the day now. BP readings noted below. She would like to increase bystolic dose back to 20mg .  143/95 at the doctor 152/88 yesterday   Routed to K. Alvstad PharmD to review and advise

## 2017-09-25 NOTE — Telephone Encounter (Signed)
Spoke with patient, her HR has not changed significantly, however her BP has increase significantly.  Will have her go back to Bystolic 20 mg daily until I can see her next week for a hypertension visit.

## 2017-09-25 NOTE — Progress Notes (Signed)
Office: [phone]  /  Fax: [phone]   HPI:   Chief Complaint: OBESITY Toni [name] is here to discuss her progress with her obesity treatment plan. She is on the Category 2 plan and is following her eating plan approximately 99 % of the time. She states she is doing physical therapy and walking for 15 minutes 4 times per week. Toni [name] is doing well on Category 2 plan. She has had some fluid shifts recently but still doing well overall.  Her weight is 227 lb (103 kg) today and has gained 2 pounds since her last visit. She has lost 30 lbs since starting treatment with [name].  Constipation Toni [name] notes constipation for the last few weeks, worse since attempting weight loss. She is on lactulose and Colace but still has occasional constipation, which is expected with multiple sclerosis. She hasn't been drinking much [name] recently. She states BM are less frequent and are not hard and painful. She denies hematochezia or melena.  ALLERGIES: Allergies  Allergen Reactions  . Hydrocodone Nausea And Vomiting    Hydrocodone causes vomiting  . Oxycodone Nausea And Vomiting    Oral oxycodone causes vomiting  . Chlorhexidine Gluconate Rash    Sores at site  . Zoloft [Sertraline] Hives, Swelling and Rash    MEDICATIONS: Current Outpatient Medications on File Prior to Visit  Medication Sig Dispense Refill  . acetaminophen (TYLENOL) 500 MG tablet Take 500-1,000 mg by mouth every 6 (six) hours as needed for fever or headache (or pain).    Marland Kitchen ALPRAZolam (XANAX) 0.5 MG tablet Take 1 tablet (0.5 mg total) by mouth 2 (two) times daily as needed for anxiety. 20 tablet 0  . baclofen (LIORESAL) 20 MG tablet Take 1 tablet (20 mg total) by mouth 3 (three) times daily as needed for muscle spasms. (Patient taking differently: Take 20 mg by mouth See admin instructions. Once to twice a day for breakthrough spasms) 180 each 3  . Biotin 10000 MCG TABS Take 1,000 mcg by mouth daily.    Marland Kitchen buPROPion (WELLBUTRIN  XL) 300 MG 24 hr tablet Take 1 tablet (300 mg total) by mouth daily. 30 tablet 0  . CALCIUM CITRATE PO Take 2 tablets by mouth 2 (two) times daily.    . cetirizine (ZYRTEC) 10 MG tablet Take 10 mg by mouth daily.    . Cholecalciferol (VITAMIN D) 2000 units tablet Take 1 tablet (2,000 Units total) by mouth 2 (two) times daily.  0  . co-enzyme Q-10 30 MG capsule Take 30 mg by mouth daily.    Marland Kitchen diltiazem (CARDIZEM CD) 120 MG 24 hr capsule TAKE 1 CAPSULE BY MOUTH  DAILY 90 capsule 2  . docusate sodium (COLACE) 100 MG capsule Take 1 capsule (100 mg total) by mouth daily. 10 capsule 0  . fluticasone (FLONASE) 50 MCG/ACT nasal spray Place 2 sprays into both nostrils daily as needed for allergies.     . furosemide (LASIX) 40 MG tablet Take 2 tablets (80mg ) by mouth in the morning and 1 tablet (40mg ) in the afternoon    . furosemide (LASIX) 40 MG tablet Take 2 tablets by mouth in the morning and 1 in the evening. 270 tablet 1  . gabapentin (NEURONTIN) 300 MG capsule TAKE 1 CAPSULE BY MOUTH UP  TO 4 TIMES DAILY 360 capsule 3  . HP ACTHAR 80 UNIT/ML injectable gel INJECT 80 UNITS (1ML) SUBCUTANEOUSLY EVERY OTHER DAY FOR A TOTAL OF 10 DAYS. REPEAT EVERY 30 DAYS. 5 mL 5  . lactulose,  encephalopathy, (GENERLAC) 10 GM/15ML SOLN Take 15 mLs (10 g total) by mouth 2 (two) times daily as needed. 946 mL 5  . levothyroxine (SYNTHROID, LEVOTHROID) 75 MCG tablet Take 75 mcg by mouth daily.    . Multiple Vitamins-Minerals (CENTRUM SILVER PO) Take 1 tablet by mouth daily.    . Nebivolol HCl (BYSTOLIC) 20 MG TABS Take 10 mg by mouth daily.     . Omega-3 Fatty Acids (FISH OIL) 1000 MG CAPS Take 1,000 mg by mouth daily.    Marland Kitchen omeprazole (PRILOSEC) 20 MG capsule Take 20 mg by mouth daily.     . psyllium (METAMUCIL SMOOTH TEXTURE) 28 % packet Take 1 packet by mouth every other day.    . ranitidine (ZANTAC) 150 MG tablet Take 150 mg by mouth at bedtime.     . simvastatin (ZOCOR) 20 MG tablet Take 10 mg by mouth at bedtime.       . SYRINGE/NEEDLE, DISP, 1 ML (BD LUER-LOK SYRINGE) 25G X 5/8" 1 ML MISC Use as directed to inject  Acthar 10 each prn  . Tamsulosin HCl (FLOMAX) 0.4 MG CAPS Take 0.4 mg by mouth daily.    Marland Kitchen UNABLE TO FIND Baclofen pump: Continuous    . warfarin (COUMADIN) 5 MG tablet TAKE 1/2 TO 1 TABLET BY  MOUTH DAILY AS DIRECTED BY  COUMADIN CLINIC. 90 tablet 1   Current Facility-Administered Medications on File Prior to Visit  Medication Dose Route Frequency Provider Last Rate Last Dose  . baclofen (LIORESAL) intrathecal injection 40 mg/34mL  40 mg Intrathecal Once Kathrynn Ducking, MD        PAST MEDICAL HISTORY: Past Medical History:  Diagnosis Date  . Abnormality of gait 03/01/2015  . Anxiety   . Arthritis    "knees" (01/19/2016)  . Atrial fibrillation with RVR ([place]) 01/19/2016  . Back pain   . Chest pain   . Childhood asthma   . Chronic pain    "nerve pain from the MS"  . Constipation   . Depression   . DVT (deep venous thrombosis) ([place]) 1983   "RLE; may have just been phlebitis; it was before the age of dopplers"  . 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 GERD (gastroesophageal reflux disease)   . History of stress test 06/21/10   limited exam with some degree of breast attenuatin of the apex however a component of apical ischemia is not excluded, EF 62%; cardiac cath   . HLD (hyperlipidemia)   . Hx of echocardiogram 04/22/10   EF 55-60%, no valve issues  . Hypertension   . Hypothyroidism   . Joint pain   . Migraine    "none since I stopped beta blocker in 1990s" (01/19/2016)  . Multiple sclerosis ([place])    has had this may 1989-[facility] reg doc  . OSA on CPAP   . Osteoarthritis   . PAF (paroxysmal atrial fibrillation) ([place])    on coumadin; documented on monitor 06/2010  . Pneumonia 11/2011  . Shortness of breath   . Thyroid disease     PAST SURGICAL HISTORY: Past Surgical History:  Procedure Laterality Date  . ANTERIOR  CERVICAL DECOMP/DISCECTOMY FUSION  01/2004  . BACK SURGERY    . CARDIAC CATHETERIZATION  06/22/2010   normal coronary arteries, PAF  . CATARACT EXTRACTION, BILATERAL Bilateral   . CHILD BIRTHS     X2  . DILATION AND CURETTAGE OF UTERUS    .  INFUSION PUMP IMPLANTATION  ~ 2009   baclofen infusion in lower abd  . PAIN PUMP REVISION N/A 07/29/2014   Procedure: Baclofen pump replacement;  Surgeon: Erline Levine, MD;  Location: [facility];  Service: Neurosurgery;  Laterality: N/A;  Baclofen pump replacement  . TONSILLECTOMY AND ADENOIDECTOMY    . VARICOSE VEIN SURGERY  ~ 1968  . VENOUS ABLATION  12/16/2010   radiofreq ablation -Dr Elisabeth Cara and [facility]  . WISDOM TOOTH EXTRACTION      SOCIAL HISTORY: Social History   Tobacco Use  . Smoking status: Former Smoker    Packs/day: 0.50    Years: 7.00    Pack years: 3.50    Types: Cigarettes    Last attempt to quit: 08/21/1976    Years since quitting: 41.1  . Smokeless tobacco: Never Used  Substance Use Topics  . Alcohol use: Yes    Alcohol/week: 0.6 oz    Types: 1 Glasses of wine per week  . Drug use: No    FAMILY HISTORY: Family History  Problem Relation Age of Onset  . Coronary artery disease Father        at age 43  . Hyperlipidemia Father   . Thyroid disease Father   . Coronary artery disease Maternal Grandmother   . Depression Maternal Grandmother   . Cancer Paternal Grandmother   . Depression Mother   . Sudden death Mother   . Anxiety disorder Mother   . Alcoholism Son     ROS: Review of Systems  Constitutional: Negative for weight loss.  Gastrointestinal: Positive for constipation. Negative for melena.       Negative hematochezia    PHYSICAL EXAM: Blood pressure (!) 141/79, pulse 77, temperature 99.2 F (37.3 C), temperature source Oral, height 5\' 3"  (1.6 m), weight 227 lb (103 kg), SpO2 97 %. Body mass index is 40.21 kg/m. Physical Exam  Constitutional: She is oriented to person, place, and time. She appears  well-developed and well-nourished.  Cardiovascular: Normal rate.  Pulmonary/Chest: Effort normal.  Musculoskeletal: Normal range of motion.  Neurological: She is oriented to person, place, and time.  Skin: Skin is warm and dry.  Psychiatric: She has a normal mood and affect. Her behavior is normal.  Vitals reviewed.   RECENT LABS AND TESTS: BMET    Component Value Date/Time   NA 139 07/08/2017 1214   NA 138 04/05/2017 1450   K 3.9 07/08/2017 1214   CL 99 (L) 07/08/2017 1214   CO2 31 07/08/2017 1214   GLUCOSE 92 07/08/2017 1214   BUN 22 (H) 07/08/2017 1214   BUN 27 04/05/2017 1450   CREATININE 0.67 07/08/2017 1214   CALCIUM 9.3 07/08/2017 1214   GFRNONAA >60 07/08/2017 1214   GFRAA >60 07/08/2017 1214   Lab Results  Component Value Date   HGBA1C 5.4 03/08/2017   HGBA1C  04/21/2010    5.5 (NOTE)                                                                       According to the ADA Clinical Practice Recommendations for 2011, when HbA1c is used as a screening test:   >=6.5%   Diagnostic of Diabetes Mellitus           (  if abnormal result  is confirmed)  5.7-6.4%   Increased risk of developing Diabetes Mellitus  References:Diagnosis and Classification of Diabetes Mellitus,Diabetes EXBM,8413,24(MWNUU 1):S62-S69 and Standards of Medical Care in         Diabetes - 2011,Diabetes VOZD,6644,03  (Suppl 1):S11-S61.   Lab Results  Component Value Date   INSULIN 20.4 03/08/2017   CBC    Component Value Date/Time   WBC 8.4 07/08/2017 1214   RBC 4.83 07/08/2017 1214   HGB 15.2 (H) 07/08/2017 1214   HGB 14.1 03/08/2017 1152   HCT 45.0 07/08/2017 1214   HCT 43.0 03/08/2017 1152   PLT 173 07/08/2017 1214   PLT 219 12/18/2014 1157   MCV 93.2 07/08/2017 1214   MCV 93 03/08/2017 1152   MCH 31.5 07/08/2017 1214   MCHC 33.8 07/08/2017 1214   RDW 13.3 07/08/2017 1214   RDW 13.3 03/08/2017 1152   LYMPHSABS 1.6 07/08/2017 1214   LYMPHSABS 2.2 03/08/2017 1152   MONOABS 1.3 (H)  07/08/2017 1214   EOSABS 0.2 07/08/2017 1214   EOSABS 0.1 03/08/2017 1152   BASOSABS 0.0 07/08/2017 1214   BASOSABS 0.0 03/08/2017 1152   Iron/TIBC/Ferritin/ %Sat No results found for: IRON, TIBC, FERRITIN, IRONPCTSAT Lipid Panel     Component Value Date/Time   CHOL 143 03/08/2017 1152   TRIG 89 03/08/2017 1152   HDL 57 03/08/2017 1152   CHOLHDL 3.0 04/22/2010 0536   VLDL 29 04/22/2010 0536   LDLCALC 68 03/08/2017 1152   Hepatic Function Panel     Component Value Date/Time   PROT 6.7 07/08/2017 1214   PROT 6.0 04/05/2017 1450   ALBUMIN 3.8 07/08/2017 1214   ALBUMIN 3.9 04/05/2017 1450   AST 21 07/08/2017 1214   ALT 23 07/08/2017 1214   ALKPHOS 55 07/08/2017 1214   BILITOT 0.8 07/08/2017 1214   BILITOT 0.2 04/05/2017 1450      Component Value Date/Time   TSH 2.410 03/08/2017 1152   TSH 1.083 08/06/2016 0647   TSH 1.871 01/19/2016 2302   TSH 0.206 (L) 03/10/2011 1351   TSH 1.636 06/18/2010 1430    ASSESSMENT AND PLAN: Other constipation  Class 3 severe obesity with serious comorbidity and body mass index (BMI) of 40.0 to 44.9 in adult, unspecified obesity type ([place])  PLAN:  Constipation Kianah was informed decrease bowel movement frequency is normal while losing weight, but stools should not be hard or painful. She was advised to increase her [name] intake and add miralax OTC as needed and continue to higher fiber vegetables and handout was given. Jaelen agrees to follow up with our clinic in 2 weeks.  Obesity [place] is currently in the action stage of change. As such, her goal is to continue with weight loss efforts She has agreed to follow the Category 2 plan Jahyra has been instructed to work up to a goal of 150 minutes of combined cardio and strengthening exercise per week for weight loss and overall health benefits. We discussed the following Behavioral Modification Strategies today: increasing lean protein intake, decrease eating out, and increase  [name] intake   Chantal has agreed to follow up with our clinic in 2 weeks. She was informed of the importance of frequent follow up visits to maximize her success with intensive lifestyle modifications for her multiple health conditions.   OBESITY BEHAVIORAL INTERVENTION VISIT  Today's visit was # 10 out of 22.  Starting weight: 257 lbs Starting date: 03/08/17 Today's weight : 227 lbs  Today's date: 09/25/2017 Total lbs  lost to date: 45 (Patients must lose 7 lbs in the first 6 months to continue with counseling)   ASK: We discussed the diagnosis of obesity with [name] today and Tallyn agreed to give [name] permission to discuss obesity behavioral modification therapy today.  ASSESS: Jadie has the diagnosis of obesity and her BMI today is 40.22 Stephanye is in the action stage of change   ADVISE: Zoeann was educated on the multiple health risks of obesity as well as the benefit of weight loss to improve her health. She was advised of the need for long term treatment and the importance of lifestyle modifications.  AGREE: Multiple dietary modification options and treatment options were discussed and  Idora agreed to the above obesity treatment plan.  I, Trixie Dredge, am acting as transcriptionist for Dennard Nip, MD  I have reviewed the above documentation for accuracy and completeness, and I agree with the above. -Dennard Nip, MD

## 2017-09-27 ENCOUNTER — Ambulatory Visit
Admission: RE | Admit: 2017-09-27 | Discharge: 2017-09-27 | Disposition: A | Discharge status: Home / Self Care | Payer: Medicare Other | Source: Ambulatory Visit | Attending: Internal Medicine | Admitting: Internal Medicine

## 2017-09-27 DIAGNOSIS — Z1231 Encounter for screening mammogram for malignant neoplasm of breast: Secondary | ICD-10-CM | POA: Diagnosis not present

## 2017-09-28 DIAGNOSIS — F419 Anxiety disorder, unspecified: Secondary | ICD-10-CM | POA: Diagnosis not present

## 2017-09-28 DIAGNOSIS — I110 Hypertensive heart disease with heart failure: Secondary | ICD-10-CM | POA: Diagnosis not present

## 2017-09-28 DIAGNOSIS — I480 Paroxysmal atrial fibrillation: Secondary | ICD-10-CM | POA: Diagnosis not present

## 2017-09-28 DIAGNOSIS — E785 Hyperlipidemia, unspecified: Secondary | ICD-10-CM | POA: Diagnosis not present

## 2017-09-28 DIAGNOSIS — G35 Multiple sclerosis: Secondary | ICD-10-CM | POA: Diagnosis not present

## 2017-09-28 DIAGNOSIS — I509 Heart failure, unspecified: Secondary | ICD-10-CM | POA: Diagnosis not present

## 2017-09-28 DIAGNOSIS — F329 Major depressive disorder, single episode, unspecified: Secondary | ICD-10-CM | POA: Diagnosis not present

## 2017-09-28 DIAGNOSIS — M48062 Spinal stenosis, lumbar region with neurogenic claudication: Secondary | ICD-10-CM | POA: Diagnosis not present

## 2017-09-28 DIAGNOSIS — G4733 Obstructive sleep apnea (adult) (pediatric): Secondary | ICD-10-CM | POA: Diagnosis not present

## 2017-09-28 DIAGNOSIS — E119 Type 2 diabetes mellitus without complications: Secondary | ICD-10-CM | POA: Diagnosis not present

## 2017-10-01 DIAGNOSIS — R600 Localized edema: Secondary | ICD-10-CM | POA: Diagnosis not present

## 2017-10-01 DIAGNOSIS — G4733 Obstructive sleep apnea (adult) (pediatric): Secondary | ICD-10-CM | POA: Diagnosis not present

## 2017-10-01 DIAGNOSIS — E7849 Other hyperlipidemia: Secondary | ICD-10-CM | POA: Diagnosis not present

## 2017-10-01 DIAGNOSIS — I480 Paroxysmal atrial fibrillation: Secondary | ICD-10-CM | POA: Diagnosis not present

## 2017-10-01 DIAGNOSIS — G35 Multiple sclerosis: Secondary | ICD-10-CM | POA: Diagnosis not present

## 2017-10-01 DIAGNOSIS — F418 Other specified anxiety disorders: Secondary | ICD-10-CM | POA: Diagnosis not present

## 2017-10-01 DIAGNOSIS — Z1389 Encounter for screening for other disorder: Secondary | ICD-10-CM | POA: Diagnosis not present

## 2017-10-01 DIAGNOSIS — E668 Other obesity: Secondary | ICD-10-CM | POA: Diagnosis not present

## 2017-10-01 DIAGNOSIS — N390 Urinary tract infection, site not specified: Secondary | ICD-10-CM | POA: Diagnosis not present

## 2017-10-01 DIAGNOSIS — E038 Other specified hypothyroidism: Secondary | ICD-10-CM | POA: Diagnosis not present

## 2017-10-01 DIAGNOSIS — Z79899 Other long term (current) drug therapy: Secondary | ICD-10-CM | POA: Diagnosis not present

## 2017-10-01 DIAGNOSIS — R300 Dysuria: Secondary | ICD-10-CM | POA: Diagnosis not present

## 2017-10-01 DIAGNOSIS — R7301 Impaired fasting glucose: Secondary | ICD-10-CM | POA: Diagnosis not present

## 2017-10-01 LAB — HEPATIC FUNCTION PANEL
ALT: 24 (ref 7–35)
AST: 19 (ref 13–35)
Alkaline Phosphatase: 53 (ref 25–125)
BILIRUBIN, TOTAL: 0.4

## 2017-10-01 LAB — BASIC METABOLIC PANEL
BUN: 27 — AB (ref 4–21)
Creatinine: 0.8 (ref 0.5–1.1)
GLUCOSE: 104
Potassium: 4.3 (ref 3.4–5.3)
Sodium: 140 (ref 137–147)

## 2017-10-01 LAB — CBC AND DIFFERENTIAL
HEMATOCRIT: 47 — AB (ref 36–46)
Hemoglobin: 15.1 (ref 12.0–16.0)
NEUTROS ABS: 4
Platelets: 185 (ref 150–399)
WBC: 7

## 2017-10-01 LAB — HEMOGLOBIN A1C: HEMOGLOBIN A1C: 5.2

## 2017-10-02 ENCOUNTER — Ambulatory Visit (INDEPENDENT_AMBULATORY_CARE_PROVIDER_SITE_OTHER): Payer: Medicare Other | Admitting: Pharmacist Clinician (PhC)/ Clinical Pharmacy Specialist

## 2017-10-02 ENCOUNTER — Encounter: Payer: Self-pay | Admitting: Pharmacist Clinician (PhC)/ Clinical Pharmacy Specialist

## 2017-10-02 DIAGNOSIS — Z7901 Long term (current) use of anticoagulants: Secondary | ICD-10-CM

## 2017-10-02 DIAGNOSIS — I482 Chronic atrial fibrillation: Secondary | ICD-10-CM

## 2017-10-02 DIAGNOSIS — I4821 Permanent atrial fibrillation: Secondary | ICD-10-CM

## 2017-10-02 DIAGNOSIS — I10 Essential (primary) hypertension: Secondary | ICD-10-CM | POA: Diagnosis not present

## 2017-10-02 LAB — POCT INR: INR: 2.3

## 2017-10-02 NOTE — Patient Instructions (Signed)
Return for a a follow up appointment in 1 month  Your blood pressure today is 126/70   Check your blood pressure at home once to twice daily and keep record of the readings.  Take your BP meds as follows:  Continue with your current medications  Bring all of your meds, your BP cuff and your record of home blood pressures to your next appointment.  Exercise as you're able, try to walk approximately 30 minutes per day.  Keep salt intake to a minimum, especially watch canned and prepared boxed foods.  Eat more fresh fruits and vegetables and fewer canned items.  Avoid eating in fast food restaurants.    HOW TO TAKE YOUR BLOOD PRESSURE: . Rest 5 minutes before taking your blood pressure. .  Don't smoke or drink caffeinated beverages for at least 30 minutes before. . Take your blood pressure before (not after) you eat. . Sit comfortably with your back supported and both feet on the floor (don't cross your legs). . Elevate your arm to heart level on a table or a desk. . Use the proper sized cuff. It should fit smoothly and snugly around your bare upper arm. There should be enough room to slip a fingertip under the cuff. The bottom edge of the cuff should be 1 inch above the crease of the elbow. . Ideally, take 3 measurements at one sitting and record the average.

## 2017-10-02 NOTE — Progress Notes (Signed)
10/02/2017 Toni [name] Apr 26, 1946 [id number]   HPI:  Toni [name] is a 72 y.o. female patient of Dr Debara Pickett with a PMH below who presents today for hypertension clinic evaluation.  In addition to hypertension Toni [name] has a medical history significant for CHF, AF, hyperlipidemia, multiple sclerosis (diagnosed in 1989) and OSA.     She has had had stable blood pressures, treated with diltiazem 277 mg and Bystolic 20 mg daily.  However in January she was told to cut the Bystolic dose to 10 mg daily because of some bradycardia.  Shortly after cutting the dose she noted that her heart rate was back up into the 70's, but her BP was also going up, with several readings in the 824 systolic range.  She was checked in the office at that time, but her pressure was 124/70 ad she was encouraged to continue with the 10 mg dose.   She again called about 2 weeks later with continued readings into the 150's.    We increased the Bystolic back to 20 mg and she was advised to monitor her heart rate as well as pressure.  She returns today for follow up.     Episodes of low HR in 23-53 range, so Bystolic was cut back to 10 mg.  However BP started to go up - 614'E systolic so she went back to 20 mg daily.  HR stable in the 60's   Blood Pressure Goal:  130/80  Current Medications:  Diltiazem 315 qd - am  Bystolic 20 mg - am  Family Hx:  Father died from MI - at 52  Mother died at 16 from stroke/MI ? 2022-12-15 down and did not get up  Brother with AF ablation   Son with elevated cholesterol  Social Hx:  No tobacco, quit in 77 with second pregnancy; previously wine but now not b/c of diet; no caffeine  Diet:  Eats mostly at home, some salt with cooking, but not at table; on diet with Dr. Leafy Ro, lots of vegetables and high protein; lost 48 pounds since August  Exercise:  Unable to do more than regular therapy because of MS  Home BP readings:  Has [employer] by [employer] - has been "all over the place"  120-160/80-104.  Hard to determine which readings on the home cuff belong to patient and which belong to her husband.   Home cuff reads within 10 points of the office reading today.  Intolerances:   No cardiac medication intolerances  Wt Readings from Last 3 Encounters:  09/25/17 227 lb (103 kg)  09/06/17 225 lb (102.1 kg)  08/23/17 240 lb (108.9 kg)   BP Readings from Last 3 Encounters:  10/02/17 126/70  09/25/17 (!) 141/79  09/06/17 (!) 103/41   Pulse Readings from Last 3 Encounters:  10/02/17 76  09/25/17 77  09/06/17 (!) 52    Current Outpatient Medications  Medication Sig Dispense Refill  . acetaminophen (TYLENOL) 500 MG tablet Take 500-1,000 mg by mouth every 6 (six) hours as needed for fever or headache (or pain).    Marland Kitchen ALPRAZolam (XANAX) 0.5 MG tablet Take 1 tablet (0.5 mg total) by mouth 2 (two) times daily as needed for anxiety. 20 tablet 0  . baclofen (LIORESAL) 20 MG tablet Take 1 tablet (20 mg total) by mouth 3 (three) times daily as needed for muscle spasms. (Patient taking differently: Take 20 mg by mouth See admin instructions. Once to twice a day for breakthrough spasms) 180  each 3  . Biotin 10000 MCG TABS Take 1,000 mcg by mouth daily.    Marland Kitchen buPROPion (WELLBUTRIN XL) 300 MG 24 hr tablet Take 1 tablet (300 mg total) by mouth daily. 30 tablet 0  . CALCIUM CITRATE PO Take 2 tablets by mouth 2 (two) times daily.    . cetirizine (ZYRTEC) 10 MG tablet Take 10 mg by mouth daily.    . Cholecalciferol (VITAMIN D) 2000 units tablet Take 1 tablet (2,000 Units total) by mouth 2 (two) times daily.  0  . co-enzyme Q-10 30 MG capsule Take 30 mg by mouth daily.    Marland Kitchen diltiazem (CARDIZEM CD) 120 MG 24 hr capsule TAKE 1 CAPSULE BY MOUTH  DAILY 90 capsule 2  . docusate sodium (COLACE) 100 MG capsule Take 1 capsule (100 mg total) by mouth daily. 10 capsule 0  . fluticasone (FLONASE) 50 MCG/ACT nasal spray Place 2 sprays into both nostrils daily as needed for allergies.     . furosemide  (LASIX) 40 MG tablet Take 2 tablets (80mg ) by mouth in the morning and 1 tablet (40mg ) in the afternoon    . furosemide (LASIX) 40 MG tablet Take 2 tablets by mouth in the morning and 1 in the evening. 270 tablet 1  . gabapentin (NEURONTIN) 300 MG capsule TAKE 1 CAPSULE BY MOUTH UP  TO 4 TIMES DAILY 360 capsule 3  . HP ACTHAR 80 UNIT/ML injectable gel INJECT 80 UNITS (1ML) SUBCUTANEOUSLY EVERY OTHER DAY FOR A TOTAL OF 10 DAYS. REPEAT EVERY 30 DAYS. 5 mL 5  . lactulose, encephalopathy, (GENERLAC) 10 GM/15ML SOLN Take 15 mLs (10 g total) by mouth 2 (two) times daily as needed. 946 mL 5  . levothyroxine (SYNTHROID, LEVOTHROID) 75 MCG tablet Take 75 mcg by mouth daily.    . Multiple Vitamins-Minerals (CENTRUM SILVER PO) Take 1 tablet by mouth daily.    . Nebivolol HCl (BYSTOLIC) 20 MG TABS Take 1 tablet (20 mg total) by mouth daily. 30 tablet   . Omega-3 Fatty Acids (FISH OIL) 1000 MG CAPS Take 1,000 mg by mouth daily.    Marland Kitchen omeprazole (PRILOSEC) 20 MG capsule Take 20 mg by mouth daily.     . psyllium (METAMUCIL SMOOTH TEXTURE) 28 % packet Take 1 packet by mouth every other day.    . ranitidine (ZANTAC) 150 MG tablet Take 150 mg by mouth at bedtime.     . simvastatin (ZOCOR) 20 MG tablet Take 10 mg by mouth at bedtime.     . SYRINGE/NEEDLE, DISP, 1 ML (BD LUER-LOK SYRINGE) 25G X 5/8" 1 ML MISC Use as directed to inject  Acthar 10 each prn  . Tamsulosin HCl (FLOMAX) 0.4 MG CAPS Take 0.4 mg by mouth daily.    Marland Kitchen UNABLE TO FIND Baclofen pump: Continuous    . warfarin (COUMADIN) 5 MG tablet TAKE 1/2 TO 1 TABLET BY  MOUTH DAILY AS DIRECTED BY  COUMADIN CLINIC. 90 tablet 1   Current Facility-Administered Medications  Medication Dose Route Frequency Provider Last Rate Last Dose  . baclofen (LIORESAL) intrathecal injection 40 mg/26mL  40 mg Intrathecal Once Kathrynn Ducking, MD        Allergies  Allergen Reactions  . Hydrocodone Nausea And Vomiting    Hydrocodone causes vomiting  . Oxycodone Nausea And  Vomiting    Oral oxycodone causes vomiting  . Chlorhexidine Gluconate Rash    Sores at site  . Zoloft [Sertraline] Hives, Swelling and Rash    Past Medical History:  Diagnosis Date  . Abnormality of gait 03/01/2015  . Anxiety   . Arthritis    "knees" (01/19/2016)  . Atrial fibrillation with RVR ([place]) 01/19/2016  . Back pain   . Chest pain   . Childhood asthma   . Chronic pain    "nerve pain from the MS"  . Constipation   . Depression   . DVT (deep venous thrombosis) ([place]) 1983   "RLE; may have just been phlebitis; it was before the age of dopplers"  . 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 GERD (gastroesophageal reflux disease)   . History of stress test 06/21/10   limited exam with some degree of breast attenuatin of the apex however a component of apical ischemia is not excluded, EF 62%; cardiac cath   . HLD (hyperlipidemia)   . Hx of echocardiogram 04/22/10   EF 55-60%, no valve issues  . Hypertension   . Hypothyroidism   . Joint pain   . Migraine    "none since I stopped beta blocker in 1990s" (01/19/2016)  . Multiple sclerosis ([place])    has had this may 1989-[facility] reg doc  . OSA on CPAP   . Osteoarthritis   . PAF (paroxysmal atrial fibrillation) ([place])    on coumadin; documented on monitor 06/2010  . Pneumonia 11/2011  . Shortness of breath   . Thyroid disease     Blood pressure 126/70, pulse 76.  131/78  74   126/70  76  Hypertension Patient with essential hypertension, well controlled on diltiazem and Bystolic.  Her pressure looks good in the office again today.  Will have her keep a journal of home BP readings for another month and return to the office with that information.  If she continues to do will without any signs of bradycardia, will leave her meds alone and continue to monitor at future INR checks.     Tommy Medal PharmD CPP [facility] [address] [place], [place] [address] [phone]

## 2017-10-02 NOTE — Assessment & Plan Note (Signed)
Patient with essential hypertension, well controlled on diltiazem and Bystolic.  Her pressure looks good in the office again today.  Will have her keep a journal of home BP readings for another month and return to the office with that information.  If she continues to do will without any signs of bradycardia, will leave her meds alone and continue to monitor at future INR checks.

## 2017-10-04 DIAGNOSIS — E119 Type 2 diabetes mellitus without complications: Secondary | ICD-10-CM | POA: Diagnosis not present

## 2017-10-04 DIAGNOSIS — G35 Multiple sclerosis: Secondary | ICD-10-CM | POA: Diagnosis not present

## 2017-10-04 DIAGNOSIS — I110 Hypertensive heart disease with heart failure: Secondary | ICD-10-CM | POA: Diagnosis not present

## 2017-10-04 DIAGNOSIS — I509 Heart failure, unspecified: Secondary | ICD-10-CM | POA: Diagnosis not present

## 2017-10-04 DIAGNOSIS — M48062 Spinal stenosis, lumbar region with neurogenic claudication: Secondary | ICD-10-CM | POA: Diagnosis not present

## 2017-10-04 DIAGNOSIS — I480 Paroxysmal atrial fibrillation: Secondary | ICD-10-CM | POA: Diagnosis not present

## 2017-10-09 ENCOUNTER — Encounter (INDEPENDENT_AMBULATORY_CARE_PROVIDER_SITE_OTHER): Payer: Self-pay

## 2017-10-09 ENCOUNTER — Ambulatory Visit (INDEPENDENT_AMBULATORY_CARE_PROVIDER_SITE_OTHER): Payer: Medicare Other | Admitting: Family Medicine

## 2017-10-09 VITALS — BP 102/68 | HR 74 | Temp 98.1°F | Ht 63.0 in | Wt 225.0 lb

## 2017-10-09 DIAGNOSIS — G4733 Obstructive sleep apnea (adult) (pediatric): Secondary | ICD-10-CM | POA: Diagnosis not present

## 2017-10-09 DIAGNOSIS — Z6839 Body mass index (BMI) 39.0-39.9, adult: Secondary | ICD-10-CM

## 2017-10-09 NOTE — Progress Notes (Signed)
Office: [phone]  /  Fax: [phone]   HPI:   Chief Complaint: OBESITY Toni [name] is here to discuss her progress with her obesity treatment plan. She is on the Category 2 plan and is following her eating plan approximately 99 % of the time. She states she is exercising with barbells and hand weights. Toni [name] is doing lower body resistance exercises. Toni [name] continues to do well with weight loss on the category 2 plan. Her BMI is now under 40 and hunger is controlled. Her husband is cooking, and is supporting her weight loss efforts. Her weight is 225 lb (102.1 kg) today and has had a weight loss of 2 pounds over a period of 2 weeks since her last visit. She has lost 32 lbs since starting treatment with [name].  Sleep Apnea (central and obstructive) Toni [name] is on BiPAP and is doing well overall. She wears her BiPAP all night for 7 to 9 hours per night and doesn't feel her settings are too strong. Toni [name] is sleeping well and her mask is fitting well.   ALLERGIES: Allergies  Allergen Reactions  . Hydrocodone Nausea And Vomiting    Hydrocodone causes vomiting  . Oxycodone Nausea And Vomiting    Oral oxycodone causes vomiting  . Chlorhexidine Gluconate Rash    Sores at site  . Zoloft [Sertraline] Hives, Swelling and Rash    MEDICATIONS: Current Outpatient Medications on File Prior to Visit  Medication Sig Dispense Refill  . acetaminophen (TYLENOL) 500 MG tablet Take 500-1,000 mg by mouth every 6 (six) hours as needed for fever or headache (or pain).    Marland Kitchen ALPRAZolam (XANAX) 0.5 MG tablet Take 1 tablet (0.5 mg total) by mouth 2 (two) times daily as needed for anxiety. 20 tablet 0  . baclofen (LIORESAL) 20 MG tablet Take 1 tablet (20 mg total) by mouth 3 (three) times daily as needed for muscle spasms. (Patient taking differently: Take 20 mg by mouth See admin instructions. Once to twice a day for breakthrough spasms) 180 each 3  . Biotin 10000 MCG TABS Take 1,000 mcg by mouth  daily.    Marland Kitchen buPROPion (WELLBUTRIN XL) 300 MG 24 hr tablet Take 1 tablet (300 mg total) by mouth daily. 30 tablet 0  . CALCIUM CITRATE PO Take 2 tablets by mouth 2 (two) times daily.    . cetirizine (ZYRTEC) 10 MG tablet Take 10 mg by mouth daily.    . Cholecalciferol (VITAMIN D) 2000 units tablet Take 1 tablet (2,000 Units total) by mouth 2 (two) times daily.  0  . co-enzyme Q-10 30 MG capsule Take 30 mg by mouth daily.    Marland Kitchen diltiazem (CARDIZEM CD) 120 MG 24 hr capsule TAKE 1 CAPSULE BY MOUTH  DAILY 90 capsule 2  . docusate sodium (COLACE) 100 MG capsule Take 1 capsule (100 mg total) by mouth daily. 10 capsule 0  . fluticasone (FLONASE) 50 MCG/ACT nasal spray Place 2 sprays into both nostrils daily as needed for allergies.     . furosemide (LASIX) 40 MG tablet Take 2 tablets (80mg ) by mouth in the morning and 1 tablet (40mg ) in the afternoon    . furosemide (LASIX) 40 MG tablet Take 2 tablets by mouth in the morning and 1 in the evening. 270 tablet 1  . gabapentin (NEURONTIN) 300 MG capsule TAKE 1 CAPSULE BY MOUTH UP  TO 4 TIMES DAILY 360 capsule 3  . HP ACTHAR 80 UNIT/ML injectable gel INJECT 80 UNITS (1ML) SUBCUTANEOUSLY EVERY OTHER DAY FOR  A TOTAL OF 10 DAYS. REPEAT EVERY 30 DAYS. 5 mL 5  . lactulose, encephalopathy, (GENERLAC) 10 GM/15ML SOLN Take 15 mLs (10 g total) by mouth 2 (two) times daily as needed. 946 mL 5  . levothyroxine (SYNTHROID, LEVOTHROID) 75 MCG tablet Take 75 mcg by mouth daily.    . Multiple Vitamins-Minerals (CENTRUM SILVER PO) Take 1 tablet by mouth daily.    . Nebivolol HCl (BYSTOLIC) 20 MG TABS Take 1 tablet (20 mg total) by mouth daily. 30 tablet   . Omega-3 Fatty Acids (FISH OIL) 1000 MG CAPS Take 1,000 mg by mouth daily.    Marland Kitchen omeprazole (PRILOSEC) 20 MG capsule Take 20 mg by mouth daily.     . psyllium (METAMUCIL SMOOTH TEXTURE) 28 % packet Take 1 packet by mouth every other day.    . ranitidine (ZANTAC) 150 MG tablet Take 150 mg by mouth at bedtime.     .  simvastatin (ZOCOR) 20 MG tablet Take 10 mg by mouth at bedtime.     . SYRINGE/NEEDLE, DISP, 1 ML (BD LUER-LOK SYRINGE) 25G X 5/8" 1 ML MISC Use as directed to inject  Acthar 10 each prn  . Tamsulosin HCl (FLOMAX) 0.4 MG CAPS Take 0.4 mg by mouth daily.    Marland Kitchen UNABLE TO FIND Baclofen pump: Continuous    . warfarin (COUMADIN) 5 MG tablet TAKE 1/2 TO 1 TABLET BY  MOUTH DAILY AS DIRECTED BY  COUMADIN CLINIC. 90 tablet 1   Current Facility-Administered Medications on File Prior to Visit  Medication Dose Route Frequency Provider Last Rate Last Dose  . baclofen (LIORESAL) intrathecal injection 40 mg/87mL  40 mg Intrathecal Once Kathrynn Ducking, MD        PAST MEDICAL HISTORY: Past Medical History:  Diagnosis Date  . Abnormality of gait 03/01/2015  . Anxiety   . Arthritis    "knees" (01/19/2016)  . Atrial fibrillation with RVR ([place]) 01/19/2016  . Back pain   . Chest pain   . Childhood asthma   . Chronic pain    "nerve pain from the MS"  . Constipation   . Depression   . DVT (deep venous thrombosis) ([place]) 1983   "RLE; may have just been phlebitis; it was before the age of dopplers"  . 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 GERD (gastroesophageal reflux disease)   . History of stress test 06/21/10   limited exam with some degree of breast attenuatin of the apex however a component of apical ischemia is not excluded, EF 62%; cardiac cath   . HLD (hyperlipidemia)   . Hx of echocardiogram 04/22/10   EF 55-60%, no valve issues  . Hypertension   . Hypothyroidism   . Joint pain   . Migraine    "none since I stopped beta blocker in 1990s" (01/19/2016)  . Multiple sclerosis ([place])    has had this may 1989-[place] reg doc  . OSA on CPAP   . Osteoarthritis   . PAF (paroxysmal atrial fibrillation) ([place])    on coumadin; documented on monitor 06/2010  . Pneumonia 11/2011  . Shortness of breath   . Thyroid disease     PAST SURGICAL  HISTORY: Past Surgical History:  Procedure Laterality Date  . ANTERIOR CERVICAL DECOMP/DISCECTOMY FUSION  01/2004  . BACK SURGERY    . CARDIAC CATHETERIZATION  06/22/2010   normal coronary arteries, PAF  . CATARACT EXTRACTION, BILATERAL Bilateral   .  CHILD BIRTHS     X2  . DILATION AND CURETTAGE OF UTERUS    . INFUSION PUMP IMPLANTATION  ~ 2009   baclofen infusion in lower abd  . PAIN PUMP REVISION N/A 07/29/2014   Procedure: Baclofen pump replacement;  Surgeon: Erline Levine, MD;  Location: [facility];  Service: Neurosurgery;  Laterality: N/A;  Baclofen pump replacement  . TONSILLECTOMY AND ADENOIDECTOMY    . VARICOSE VEIN SURGERY  ~ 1968  . VENOUS ABLATION  12/16/2010   radiofreq ablation -Dr Elisabeth Cara and [facility]  . WISDOM TOOTH EXTRACTION      SOCIAL HISTORY: Social History   Tobacco Use  . Smoking status: Former Smoker    Packs/day: 0.50    Years: 7.00    Pack years: 3.50    Types: Cigarettes    Last attempt to quit: 08/21/1976    Years since quitting: 41.1  . Smokeless tobacco: Never Used  Substance Use Topics  . Alcohol use: Yes    Alcohol/week: 0.6 oz    Types: 1 Glasses of wine per week  . Drug use: No    FAMILY HISTORY: Family History  Problem Relation Age of Onset  . Coronary artery disease Father        at age 49  . Hyperlipidemia Father   . Thyroid disease Father   . Coronary artery disease Maternal Grandmother   . Depression Maternal Grandmother   . Cancer Paternal Grandmother   . Depression Mother   . Sudden death Mother   . Anxiety disorder Mother   . Alcoholism Son     ROS: Review of Systems  Constitutional: Positive for weight loss.  Psychiatric/Behavioral: The patient does not have insomnia.     PHYSICAL EXAM: Blood pressure 102/68, pulse 74, temperature 98.1 F (36.7 C), temperature source Oral, height 5\' 3"  (1.6 m), weight 225 lb (102.1 kg), SpO2 95 %. Body mass index is 39.86 kg/m. Physical Exam  Constitutional: She is oriented to  person, place, and time. She appears well-developed and well-nourished.  Cardiovascular: Normal rate.  Pulmonary/Chest: Effort normal.  Musculoskeletal: Normal range of motion.  Neurological: She is oriented to person, place, and time.  Skin: Skin is warm and dry.  Psychiatric: She has a normal mood and affect. Her behavior is normal.  Vitals reviewed.   RECENT LABS AND TESTS: BMET    Component Value Date/Time   NA 140 10/01/2017   K 4.3 10/01/2017   CL 99 (L) 07/08/2017 1214   CO2 31 07/08/2017 1214   GLUCOSE 92 07/08/2017 1214   BUN 27 (A) 10/01/2017   CREATININE 0.8 10/01/2017   CREATININE 0.67 07/08/2017 1214   CALCIUM 9.3 07/08/2017 1214   GFRNONAA >60 07/08/2017 1214   GFRAA >60 07/08/2017 1214   Lab Results  Component Value Date   HGBA1C 5.2 10/01/2017   HGBA1C 5.4 03/08/2017   HGBA1C  04/21/2010    5.5 (NOTE)                                                                       According to the ADA Clinical Practice Recommendations for 2011, when HbA1c is used as a screening test:   >=6.5%   Diagnostic of Diabetes Mellitus           (  if abnormal result  is confirmed)  5.7-6.4%   Increased risk of developing Diabetes Mellitus  References:Diagnosis and Classification of Diabetes Mellitus,Diabetes YHCW,2376,28(BTDVV 1):S62-S69 and Standards of Medical Care in         Diabetes - 2011,Diabetes OHYW,7371,06  (Suppl 1):S11-S61.   Lab Results  Component Value Date   INSULIN 20.4 03/08/2017   CBC    Component Value Date/Time   WBC 7.0 10/01/2017   WBC 8.4 07/08/2017 1214   RBC 4.83 07/08/2017 1214   HGB 15.1 10/01/2017   HGB 14.1 03/08/2017 1152   HCT 47 (A) 10/01/2017   HCT 43.0 03/08/2017 1152   PLT 185 10/01/2017   PLT 219 12/18/2014 1157   MCV 93.2 07/08/2017 1214   MCV 93 03/08/2017 1152   MCH 31.5 07/08/2017 1214   MCHC 33.8 07/08/2017 1214   RDW 13.3 07/08/2017 1214   RDW 13.3 03/08/2017 1152   LYMPHSABS 1.6 07/08/2017 1214   LYMPHSABS 2.2 03/08/2017  1152   MONOABS 1.3 (H) 07/08/2017 1214   EOSABS 0.2 07/08/2017 1214   EOSABS 0.1 03/08/2017 1152   BASOSABS 0.0 07/08/2017 1214   BASOSABS 0.0 03/08/2017 1152   Iron/TIBC/Ferritin/ %Sat No results found for: IRON, TIBC, FERRITIN, IRONPCTSAT Lipid Panel     Component Value Date/Time   CHOL 143 03/08/2017 1152   TRIG 89 03/08/2017 1152   HDL 57 03/08/2017 1152   CHOLHDL 3.0 04/22/2010 0536   VLDL 29 04/22/2010 0536   LDLCALC 68 03/08/2017 1152   Hepatic Function Panel     Component Value Date/Time   PROT 6.7 07/08/2017 1214   PROT 6.0 04/05/2017 1450   ALBUMIN 3.8 07/08/2017 1214   ALBUMIN 3.9 04/05/2017 1450   AST 19 10/01/2017   ALT 24 10/01/2017   ALKPHOS 53 10/01/2017   BILITOT 0.8 07/08/2017 1214   BILITOT 0.2 04/05/2017 1450      Component Value Date/Time   TSH 2.410 03/08/2017 1152   TSH 1.083 08/06/2016 0647   TSH 1.871 01/19/2016 2302   TSH 0.206 (L) 03/10/2011 1351   TSH 1.636 06/18/2010 1430    ASSESSMENT AND PLAN: Obstructive sleep apnea syndrome  Class 2 severe obesity with serious comorbidity and body mass index (BMI) of 39.0 to 39.9 in adult, unspecified obesity type ([place])  PLAN:  Sleep Apnea (central and obstructive) Toni [name] will continue with weight loss to treat sleep apnea, and will monitor for signs that we need to change the settings or the mask.  We spent > than 50% of the 15 minute visit on the counseling as documented in the note.  Obesity Toni [name] is currently in the action stage of change. As such, her goal is to continue with weight loss efforts She has agreed to follow the Category 2 plan Toni [name] has been instructed to work up to a goal of 150 minutes of combined cardio and strengthening exercise per week or continue doing lower body resistance exercises and exercising with barbells and weights for weight loss and overall health benefits. We discussed the following Behavioral Modification Strategies today: increase H2O intake  and work on meal planning and easy cooking plans  Toni [name] has agreed to follow up with our clinic in 2 weeks. She was informed of the importance of frequent follow up visits to maximize her success with intensive lifestyle modifications for her multiple health conditions.   OBESITY BEHAVIORAL INTERVENTION VISIT  Today's visit was # 11 out of 22.  Starting weight: 257 lbs Starting date: 03/08/17 Today's weight : 225  lbs  Today's date: 10/09/2017 Total lbs lost to date: 87 (Patients must lose 7 lbs in the first 6 months to continue with counseling)   ASK: We discussed the diagnosis of obesity with Toni [name] today and Toni [name] agreed to give [name] permission to discuss obesity behavioral modification therapy today.  ASSESS: Toni [name] has the diagnosis of obesity and her BMI today is 39.87 Toni [name] is in the action stage of change   ADVISE: Toni [name] was educated on the multiple health risks of obesity as well as the benefit of weight loss to improve her health. She was advised of the need for long term treatment and the importance of lifestyle modifications.  AGREE: Multiple dietary modification options and treatment options were discussed and  Toni [name] agreed to the above obesity treatment plan.  I, Doreene Nest, am acting as transcriptionist for Dennard Nip, MD  I have reviewed the above documentation for accuracy and completeness, and I agree with the above. -Dennard Nip, MD

## 2017-10-12 DIAGNOSIS — I110 Hypertensive heart disease with heart failure: Secondary | ICD-10-CM | POA: Diagnosis not present

## 2017-10-12 DIAGNOSIS — I509 Heart failure, unspecified: Secondary | ICD-10-CM | POA: Diagnosis not present

## 2017-10-12 DIAGNOSIS — E119 Type 2 diabetes mellitus without complications: Secondary | ICD-10-CM | POA: Diagnosis not present

## 2017-10-12 DIAGNOSIS — M48062 Spinal stenosis, lumbar region with neurogenic claudication: Secondary | ICD-10-CM | POA: Diagnosis not present

## 2017-10-12 DIAGNOSIS — I480 Paroxysmal atrial fibrillation: Secondary | ICD-10-CM | POA: Diagnosis not present

## 2017-10-12 DIAGNOSIS — G35 Multiple sclerosis: Secondary | ICD-10-CM | POA: Diagnosis not present

## 2017-10-15 ENCOUNTER — Ambulatory Visit: Payer: Medicare Other | Admitting: Neurology

## 2017-10-17 ENCOUNTER — Encounter: Payer: Self-pay | Admitting: Neurology

## 2017-10-17 ENCOUNTER — Ambulatory Visit (INDEPENDENT_AMBULATORY_CARE_PROVIDER_SITE_OTHER): Payer: Medicare Other | Admitting: Neurology

## 2017-10-17 VITALS — BP 112/78 | HR 88 | Ht 63.0 in | Wt 225.0 lb

## 2017-10-17 DIAGNOSIS — R1903 Right lower quadrant abdominal swelling, mass and lump: Secondary | ICD-10-CM | POA: Diagnosis not present

## 2017-10-17 DIAGNOSIS — G35 Multiple sclerosis: Secondary | ICD-10-CM

## 2017-10-17 MED ORDER — BACLOFEN 40 MG/20ML IT SOLN
80.0000 mg | Freq: Once | INTRATHECAL | Status: AC
  Administered 2017-10-17: 80 mg via INTRATHECAL

## 2017-10-17 NOTE — Progress Notes (Signed)
Please refer to baclofen pump refill note.

## 2017-10-17 NOTE — Procedures (Signed)
     History:  Toni [name] is a 72 year old patient with a history of multiple sclerosis with a spastic paraparesis.  The patient has a baclofen pump in place, she comes in for a baclofen pump refill.  She has been doing relatively well with her level of spasticity.  Baclofen pump refill note  The baclofen pump site was cleaned with Betadine solution. A 21-gauge needle was inserted into the pump port site. Approximately 5 cc of residual baclofen was removed, the pump indicates a 1.6 cc residual. 40 cc of replacement baclofen was placed into the pump at 2000 mcg/cc concentration.  The pump was reprogrammed for the following settings: The patient was kept at a simple continuous rate of 681.0 mcg/day.  The alarm volume is set at 1.5 cc. The next alarm date is February 07, 2018.  The patient tolerated the procedure well. There were no complications of the above procedure.  The Zeb number is [id number] The baclofen expiration date is August 21, 2019. The baclofen lot number is [id number].

## 2017-10-18 DIAGNOSIS — E119 Type 2 diabetes mellitus without complications: Secondary | ICD-10-CM | POA: Diagnosis not present

## 2017-10-18 DIAGNOSIS — I110 Hypertensive heart disease with heart failure: Secondary | ICD-10-CM | POA: Diagnosis not present

## 2017-10-18 DIAGNOSIS — G35 Multiple sclerosis: Secondary | ICD-10-CM | POA: Diagnosis not present

## 2017-10-18 DIAGNOSIS — I509 Heart failure, unspecified: Secondary | ICD-10-CM | POA: Diagnosis not present

## 2017-10-18 DIAGNOSIS — M48062 Spinal stenosis, lumbar region with neurogenic claudication: Secondary | ICD-10-CM | POA: Diagnosis not present

## 2017-10-18 DIAGNOSIS — I480 Paroxysmal atrial fibrillation: Secondary | ICD-10-CM | POA: Diagnosis not present

## 2017-10-24 ENCOUNTER — Ambulatory Visit (INDEPENDENT_AMBULATORY_CARE_PROVIDER_SITE_OTHER): Payer: Medicare Other | Admitting: Family Medicine

## 2017-10-24 VITALS — BP 130/84 | HR 84 | Temp 98.1°F | Ht 63.0 in | Wt 222.0 lb

## 2017-10-24 DIAGNOSIS — E7849 Other hyperlipidemia: Secondary | ICD-10-CM

## 2017-10-24 DIAGNOSIS — Z6839 Body mass index (BMI) 39.0-39.9, adult: Secondary | ICD-10-CM | POA: Diagnosis not present

## 2017-10-24 NOTE — Progress Notes (Signed)
Office: [phone]  /  Fax: [phone]   HPI:   Chief Complaint: OBESITY Toni [name] is here to discuss her progress with her obesity treatment plan. She is on the Category 2 plan and is following her eating plan approximately 99 % of the time. She states she is doing physical therapy for 30 minutes 2 times per week. Toni [name] continues to do well with weight loss. Often sleeps late and starts her day with lunch and eats breakfast at night. She feels she has increased energy and hunger is controlled.  Her weight is 222 lb (100.7 kg) today and has had a weight loss of 3 pounds over a period of 2 weeks since her last visit. She has lost 35 lbs since starting treatment with [name].  Hyperlipidemia Toni [name] has hyperlipidemia and has been attempting to improve her cholesterol levels with intensive lifestyle modification including a low saturated fat diet, exercise and weight loss. She is doing well decreasing saturated fats. She denies any chest pain, on Zocor and denies claudication or myalgias.  ALLERGIES: Allergies  Allergen Reactions  . Hydrocodone Nausea And Vomiting    Hydrocodone causes vomiting  . Oxycodone Nausea And Vomiting    Oral oxycodone causes vomiting  . Chlorhexidine Gluconate Rash    Sores at site  . Zoloft [Sertraline] Hives, Swelling and Rash    MEDICATIONS: Current Outpatient Medications on File Prior to Visit  Medication Sig Dispense Refill  . acetaminophen (TYLENOL) 500 MG tablet Take 500-1,000 mg by mouth every 6 (six) hours as needed for fever or headache (or pain).    Marland Kitchen ALPRAZolam (XANAX) 0.5 MG tablet Take 1 tablet (0.5 mg total) by mouth 2 (two) times daily as needed for anxiety. 20 tablet 0  . baclofen (LIORESAL) 20 MG tablet Take 1 tablet (20 mg total) by mouth 3 (three) times daily as needed for muscle spasms. (Patient taking differently: Take 20 mg by mouth See admin instructions. Once to twice a day for breakthrough spasms) 180 each 3  . Biotin 10000  MCG TABS Take 1,000 mcg by mouth daily.    Marland Kitchen buPROPion (WELLBUTRIN XL) 300 MG 24 hr tablet Take 1 tablet (300 mg total) by mouth daily. 30 tablet 0  . CALCIUM CITRATE PO Take 2 tablets by mouth 2 (two) times daily.    . cetirizine (ZYRTEC) 10 MG tablet Take 10 mg by mouth daily.    . Cholecalciferol (VITAMIN D) 2000 units tablet Take 1 tablet (2,000 Units total) by mouth 2 (two) times daily.  0  . co-enzyme Q-10 30 MG capsule Take 30 mg by mouth daily.    Marland Kitchen diltiazem (CARDIZEM CD) 120 MG 24 hr capsule TAKE 1 CAPSULE BY MOUTH  DAILY 90 capsule 2  . docusate sodium (COLACE) 100 MG capsule Take 1 capsule (100 mg total) by mouth daily. 10 capsule 0  . fluticasone (FLONASE) 50 MCG/ACT nasal spray Place 2 sprays into both nostrils daily as needed for allergies.     . furosemide (LASIX) 40 MG tablet Take 2 tablets (80mg ) by mouth in the morning and 1 tablet (40mg ) in the afternoon    . furosemide (LASIX) 40 MG tablet Take 2 tablets by mouth in the morning and 1 in the evening. 270 tablet 1  . gabapentin (NEURONTIN) 300 MG capsule TAKE 1 CAPSULE BY MOUTH UP  TO 4 TIMES DAILY 360 capsule 3  . HP ACTHAR 80 UNIT/ML injectable gel INJECT 80 UNITS (1ML) SUBCUTANEOUSLY EVERY OTHER DAY FOR A TOTAL OF 10  DAYS. REPEAT EVERY 30 DAYS. 5 mL 5  . lactulose, encephalopathy, (GENERLAC) 10 GM/15ML SOLN Take 15 mLs (10 g total) by mouth 2 (two) times daily as needed. 946 mL 5  . levothyroxine (SYNTHROID, LEVOTHROID) 75 MCG tablet Take 75 mcg by mouth daily.    . Multiple Vitamins-Minerals (CENTRUM SILVER PO) Take 1 tablet by mouth daily.    . Nebivolol HCl (BYSTOLIC) 20 MG TABS Take 1 tablet (20 mg total) by mouth daily. 30 tablet   . Omega-3 Fatty Acids (FISH OIL) 1000 MG CAPS Take 1,000 mg by mouth daily.    Marland Kitchen omeprazole (PRILOSEC) 20 MG capsule Take 20 mg by mouth daily.     . psyllium (METAMUCIL SMOOTH TEXTURE) 28 % packet Take 1 packet by mouth every other day.    . ranitidine (ZANTAC) 150 MG tablet Take 150 mg by  mouth at bedtime.     . simvastatin (ZOCOR) 20 MG tablet Take 10 mg by mouth at bedtime.     . SYRINGE/NEEDLE, DISP, 1 ML (BD LUER-LOK SYRINGE) 25G X 5/8" 1 ML MISC Use as directed to inject  Acthar 10 each prn  . Tamsulosin HCl (FLOMAX) 0.4 MG CAPS Take 0.4 mg by mouth daily.    Marland Kitchen UNABLE TO FIND Baclofen pump: Continuous    . warfarin (COUMADIN) 5 MG tablet TAKE 1/2 TO 1 TABLET BY  MOUTH DAILY AS DIRECTED BY  COUMADIN CLINIC. 90 tablet 1   Current Facility-Administered Medications on File Prior to Visit  Medication Dose Route Frequency Provider Last Rate Last Dose  . baclofen (LIORESAL) intrathecal injection 40 mg/9mL  40 mg Intrathecal Once Kathrynn Ducking, MD        PAST MEDICAL HISTORY: Past Medical History:  Diagnosis Date  . Abnormality of gait 03/01/2015  . Anxiety   . Arthritis    "knees" (01/19/2016)  . Atrial fibrillation with RVR ([place]) 01/19/2016  . Back pain   . Chest pain   . Childhood asthma   . Chronic pain    "nerve pain from the MS"  . Constipation   . Depression   . DVT (deep venous thrombosis) ([place]) 1983   "RLE; may have just been phlebitis; it was before the age of dopplers"  . 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 GERD (gastroesophageal reflux disease)   . History of stress test 06/21/10   limited exam with some degree of breast attenuatin of the apex however a component of apical ischemia is not excluded, EF 62%; cardiac cath   . HLD (hyperlipidemia)   . Hx of echocardiogram 04/22/10   EF 55-60%, no valve issues  . Hypertension   . Hypothyroidism   . Joint pain   . Migraine    "none since I stopped beta blocker in 1990s" (01/19/2016)  . Multiple sclerosis ([place])    has had this may 1989-[place] reg doc  . OSA on CPAP   . Osteoarthritis   . PAF (paroxysmal atrial fibrillation) ([place])    on coumadin; documented on monitor 06/2010  . Pneumonia 11/2011  . Shortness of breath   . Thyroid disease      PAST SURGICAL HISTORY: Past Surgical History:  Procedure Laterality Date  . ANTERIOR CERVICAL DECOMP/DISCECTOMY FUSION  01/2004  . BACK SURGERY    . CARDIAC CATHETERIZATION  06/22/2010   normal coronary arteries, PAF  . CATARACT EXTRACTION, BILATERAL Bilateral   . CHILD  BIRTHS     X2  . DILATION AND CURETTAGE OF UTERUS    . INFUSION PUMP IMPLANTATION  ~ 2009   baclofen infusion in lower abd  . PAIN PUMP REVISION N/A 07/29/2014   Procedure: Baclofen pump replacement;  Surgeon: Erline Levine, MD;  Location: [place] [facility];  Service: Neurosurgery;  Laterality: N/A;  Baclofen pump replacement  . TONSILLECTOMY AND ADENOIDECTOMY    . VARICOSE VEIN SURGERY  ~ 1968  . VENOUS ABLATION  12/16/2010   radiofreq ablation -Dr Elisabeth Cara and [facility]  . WISDOM TOOTH EXTRACTION      SOCIAL HISTORY: Social History   Tobacco Use  . Smoking status: Former Smoker    Packs/day: 0.50    Years: 7.00    Pack years: 3.50    Types: Cigarettes    Last attempt to quit: 08/21/1976    Years since quitting: 41.2  . Smokeless tobacco: Never Used  Substance Use Topics  . Alcohol use: Yes    Alcohol/week: 0.6 oz    Types: 1 Glasses of wine per week  . Drug use: No    FAMILY HISTORY: Family History  Problem Relation Age of Onset  . Coronary artery disease Father        at age 15  . Hyperlipidemia Father   . Thyroid disease Father   . Coronary artery disease Maternal Grandmother   . Depression Maternal Grandmother   . Cancer Paternal Grandmother   . Depression Mother   . Sudden death Mother   . Anxiety disorder Mother   . Alcoholism Son     ROS: Review of Systems  Constitutional: Positive for weight loss.  Cardiovascular: Negative for chest pain and claudication.  Musculoskeletal: Negative for myalgias.    PHYSICAL EXAM: Blood pressure 130/84, pulse 84, temperature 98.1 F (36.7 C), temperature source Oral, height 5\' 3"  (1.6 m), weight 222 lb (100.7 kg), SpO2 92 %. Body mass index is 39.33  kg/m. Physical Exam  Constitutional: She is oriented to person, place, and time. She appears well-developed and well-nourished.  Cardiovascular: Normal rate.  Pulmonary/Chest: Effort normal.  Musculoskeletal: Normal range of motion.  Neurological: She is oriented to person, place, and time.  Skin: Skin is dry.  Psychiatric: She has a normal mood and affect. Her behavior is normal.  Vitals reviewed.   RECENT LABS AND TESTS: BMET    Component Value Date/Time   NA 140 10/01/2017   K 4.3 10/01/2017   CL 99 (L) 07/08/2017 1214   CO2 31 07/08/2017 1214   GLUCOSE 92 07/08/2017 1214   BUN 27 (A) 10/01/2017   CREATININE 0.8 10/01/2017   CREATININE 0.67 07/08/2017 1214   CALCIUM 9.3 07/08/2017 1214   GFRNONAA >60 07/08/2017 1214   GFRAA >60 07/08/2017 1214   Lab Results  Component Value Date   HGBA1C 5.2 10/01/2017   HGBA1C 5.4 03/08/2017   HGBA1C  04/21/2010    5.5 (NOTE)                                                                       According to the ADA Clinical Practice Recommendations for 2011, when HbA1c is used as a screening test:   >=6.5%   Diagnostic of Diabetes Mellitus           (  if abnormal result  is confirmed)  5.7-6.4%   Increased risk of developing Diabetes Mellitus  References:Diagnosis and Classification of Diabetes Mellitus,Diabetes TIWP,8099,83(JASNK 1):S62-S69 and Standards of Medical Care in         Diabetes - 2011,Diabetes NLZJ,6734,19  (Suppl 1):S11-S61.   Lab Results  Component Value Date   INSULIN 20.4 03/08/2017   CBC    Component Value Date/Time   WBC 7.0 10/01/2017   WBC 8.4 07/08/2017 1214   RBC 4.83 07/08/2017 1214   HGB 15.1 10/01/2017   HGB 14.1 03/08/2017 1152   HCT 47 (A) 10/01/2017   HCT 43.0 03/08/2017 1152   PLT 185 10/01/2017   PLT 219 12/18/2014 1157   MCV 93.2 07/08/2017 1214   MCV 93 03/08/2017 1152   MCH 31.5 07/08/2017 1214   MCHC 33.8 07/08/2017 1214   RDW 13.3 07/08/2017 1214   RDW 13.3 03/08/2017 1152   LYMPHSABS  1.6 07/08/2017 1214   LYMPHSABS 2.2 03/08/2017 1152   MONOABS 1.3 (H) 07/08/2017 1214   EOSABS 0.2 07/08/2017 1214   EOSABS 0.1 03/08/2017 1152   BASOSABS 0.0 07/08/2017 1214   BASOSABS 0.0 03/08/2017 1152   Iron/TIBC/Ferritin/ %Sat No results found for: IRON, TIBC, FERRITIN, IRONPCTSAT Lipid Panel     Component Value Date/Time   CHOL 143 03/08/2017 1152   TRIG 89 03/08/2017 1152   HDL 57 03/08/2017 1152   CHOLHDL 3.0 04/22/2010 0536   VLDL 29 04/22/2010 0536   LDLCALC 68 03/08/2017 1152   Hepatic Function Panel     Component Value Date/Time   PROT 6.7 07/08/2017 1214   PROT 6.0 04/05/2017 1450   ALBUMIN 3.8 07/08/2017 1214   ALBUMIN 3.9 04/05/2017 1450   AST 19 10/01/2017   ALT 24 10/01/2017   ALKPHOS 53 10/01/2017   BILITOT 0.8 07/08/2017 1214   BILITOT 0.2 04/05/2017 1450      Component Value Date/Time   TSH 2.410 03/08/2017 1152   TSH 1.083 08/06/2016 0647   TSH 1.871 01/19/2016 2302   TSH 0.206 (L) 03/10/2011 1351   TSH 1.636 06/18/2010 1430    ASSESSMENT AND PLAN: Other hyperlipidemia  Class 2 severe obesity with serious comorbidity and body mass index (BMI) of 39.0 to 39.9 in adult, unspecified obesity type ([place])  PLAN:  Hyperlipidemia Toni [name] was informed of the American Heart Association Guidelines emphasizing intensive lifestyle modifications as the first line treatment for hyperlipidemia. We discussed many lifestyle modifications today in depth, and Toni [name] will continue to work on decreasing saturated fats such as fatty red meat, butter and many fried foods. She will also increase vegetables and lean protein in her diet and continue to work on diet, exercise, and weight loss efforts. Toni [name] agrees to follow up with our clinic in 2 weeks and we will recheck labs at that time.  We spent > than 50% of the 15 minute visit on the counseling as documented in the note.  Obesity Toni [name] is currently in the action stage of change. As such, her goal  is to continue with weight loss efforts She has agreed to follow the Category 2 plan Toni [name] has been instructed to work up to a goal of 150 minutes of combined cardio and strengthening exercise per week for weight loss and overall health benefits. We discussed the following Behavioral Modification Strategies today: work on meal planning and easy cooking plans, no skipping meals, and better snacking choices   Toni [name] has agreed to follow up with our clinic in 2 weeks. She was  informed of the importance of frequent follow up visits to maximize her success with intensive lifestyle modifications for her multiple health conditions.   OBESITY BEHAVIORAL INTERVENTION VISIT  Today's visit was # 12 out of 22.  Starting weight: 257 lbs Starting date: 03/08/17 Today's weight : 222 lbs  Today's date: 10/24/2017 Total lbs lost to date: 25 (Patients must lose 7 lbs in the first 6 months to continue with counseling)   ASK: We discussed the diagnosis of obesity with Toni [name] today and Toni [name] agreed to give [name] permission to discuss obesity behavioral modification therapy today.  ASSESS: Toni [name] has the diagnosis of obesity and her BMI today is 39.34 Toni [name] is in the action stage of change   ADVISE: Toni [name] was educated on the multiple health risks of obesity as well as the benefit of weight loss to improve her health. She was advised of the need for long term treatment and the importance of lifestyle modifications.  AGREE: Multiple dietary modification options and treatment options were discussed and  Toni [name] agreed to the above obesity treatment plan.  I, Trixie Dredge, am acting as transcriptionist for Dennard Nip, MD  I have reviewed the above documentation for accuracy and completeness, and I agree with the above. -Dennard Nip, MD

## 2017-10-25 DIAGNOSIS — M48062 Spinal stenosis, lumbar region with neurogenic claudication: Secondary | ICD-10-CM | POA: Diagnosis not present

## 2017-10-25 DIAGNOSIS — E119 Type 2 diabetes mellitus without complications: Secondary | ICD-10-CM | POA: Diagnosis not present

## 2017-10-25 DIAGNOSIS — G35 Multiple sclerosis: Secondary | ICD-10-CM | POA: Diagnosis not present

## 2017-10-25 DIAGNOSIS — I110 Hypertensive heart disease with heart failure: Secondary | ICD-10-CM | POA: Diagnosis not present

## 2017-10-25 DIAGNOSIS — I509 Heart failure, unspecified: Secondary | ICD-10-CM | POA: Diagnosis not present

## 2017-10-25 DIAGNOSIS — I480 Paroxysmal atrial fibrillation: Secondary | ICD-10-CM | POA: Diagnosis not present

## 2017-10-30 ENCOUNTER — Ambulatory Visit: Payer: Medicare Other

## 2017-10-30 ENCOUNTER — Telehealth: Payer: Self-pay | Admitting: Neurology

## 2017-10-30 ENCOUNTER — Ambulatory Visit (INDEPENDENT_AMBULATORY_CARE_PROVIDER_SITE_OTHER): Payer: Medicare Other | Admitting: Pharmacist Clinician (PhC)/ Clinical Pharmacy Specialist

## 2017-10-30 DIAGNOSIS — M48062 Spinal stenosis, lumbar region with neurogenic claudication: Secondary | ICD-10-CM | POA: Diagnosis not present

## 2017-10-30 DIAGNOSIS — I4891 Unspecified atrial fibrillation: Secondary | ICD-10-CM

## 2017-10-30 DIAGNOSIS — N390 Urinary tract infection, site not specified: Secondary | ICD-10-CM | POA: Diagnosis not present

## 2017-10-30 DIAGNOSIS — I480 Paroxysmal atrial fibrillation: Secondary | ICD-10-CM | POA: Diagnosis not present

## 2017-10-30 DIAGNOSIS — Z7901 Long term (current) use of anticoagulants: Secondary | ICD-10-CM | POA: Diagnosis not present

## 2017-10-30 DIAGNOSIS — R8290 Unspecified abnormal findings in urine: Secondary | ICD-10-CM | POA: Diagnosis not present

## 2017-10-30 DIAGNOSIS — E119 Type 2 diabetes mellitus without complications: Secondary | ICD-10-CM | POA: Diagnosis not present

## 2017-10-30 DIAGNOSIS — I509 Heart failure, unspecified: Secondary | ICD-10-CM | POA: Diagnosis not present

## 2017-10-30 DIAGNOSIS — I110 Hypertensive heart disease with heart failure: Secondary | ICD-10-CM | POA: Diagnosis not present

## 2017-10-30 DIAGNOSIS — G35 Multiple sclerosis: Secondary | ICD-10-CM | POA: Diagnosis not present

## 2017-10-30 LAB — POCT INR: INR: 3.1

## 2017-10-30 NOTE — Telephone Encounter (Signed)
Patient calling to discuss upping baclofen pump.

## 2017-10-30 NOTE — Telephone Encounter (Signed)
I called the patient.  The patient recently has had some increased abdominal spasms.  She is getting a urinalysis checked to make sure she does not have a bladder infection which is reasonable.  If the urinalysis is okay, the patient can come in any day around 4 PM and I can readjust her baclofen pump.  She will call me back if she needs to come in.

## 2017-10-31 ENCOUNTER — Telehealth: Payer: Self-pay | Admitting: Pharmacist Clinician (PhC)/ Clinical Pharmacy Specialist

## 2017-10-31 NOTE — Telephone Encounter (Signed)
Patient called to report she has UTI and was prescribed cipro 250 mg bid.   I returned her call in the afternoon and she stated that after reading some info on the medication and chose not to fill it (interactions with warfarin and increased risk of irregular heart rhythms).  She called her MD and they switched her to cephalexin 500 mg.  Assured her that this would not interfere with warfarin.

## 2017-11-05 ENCOUNTER — Telehealth: Payer: Self-pay | Admitting: Internal Medicine

## 2017-11-05 NOTE — Telephone Encounter (Signed)
Faxed signed orders to [facility] pertaining PT/INR and VS + pulse ox at every visit

## 2017-11-08 ENCOUNTER — Ambulatory Visit (INDEPENDENT_AMBULATORY_CARE_PROVIDER_SITE_OTHER): Payer: Medicare Other | Admitting: Family Medicine

## 2017-11-08 VITALS — BP 120/62 | HR 94 | Temp 98.0°F | Ht 63.0 in | Wt 218.0 lb

## 2017-11-08 DIAGNOSIS — I480 Paroxysmal atrial fibrillation: Secondary | ICD-10-CM | POA: Diagnosis not present

## 2017-11-08 DIAGNOSIS — I509 Heart failure, unspecified: Secondary | ICD-10-CM | POA: Diagnosis not present

## 2017-11-08 DIAGNOSIS — I9589 Other hypotension: Secondary | ICD-10-CM

## 2017-11-08 DIAGNOSIS — I110 Hypertensive heart disease with heart failure: Secondary | ICD-10-CM | POA: Diagnosis not present

## 2017-11-08 DIAGNOSIS — G35 Multiple sclerosis: Secondary | ICD-10-CM | POA: Diagnosis not present

## 2017-11-08 DIAGNOSIS — Z6838 Body mass index (BMI) 38.0-38.9, adult: Secondary | ICD-10-CM | POA: Diagnosis not present

## 2017-11-08 DIAGNOSIS — M48062 Spinal stenosis, lumbar region with neurogenic claudication: Secondary | ICD-10-CM | POA: Diagnosis not present

## 2017-11-08 DIAGNOSIS — E119 Type 2 diabetes mellitus without complications: Secondary | ICD-10-CM | POA: Diagnosis not present

## 2017-11-08 NOTE — Progress Notes (Signed)
Office: [phone]  /  Fax: [phone]   HPI:   Chief Complaint: OBESITY Toni [name] is here to discuss her progress with her obesity treatment plan. She is on the Category 2 plan and is following her eating plan approximately 95 % of the time. She states she is doing physical therapy for 60 minutes 3 times per week. Toni [name] continues to do well with weight loss on her Category 2 plan. Hunger is controlled but still struggles with evening snacking but save some of her food to eat in the evening. She is mindful about her food choices. She isnt drinking enough [name].  Her weight is 218 lb (98.9 kg) today and has had a weight loss of 4 pounds over a period of 2 weeks since her last visit. She has lost 39 lbs since starting treatment with [name].  Hypotension Toni [name] is on lasix and blood pressure has decreased and heart rate has increased today. She hasn't been drinking much [name]. She has a history of a-Fib and is on Cardizem and Bystolic. She is encouraged to increase [name] in office and blood pressure has increase to 120/62 and heart rate 94.  ALLERGIES: Allergies  Allergen Reactions  . Hydrocodone Nausea And Vomiting    Hydrocodone causes vomiting  . Oxycodone Nausea And Vomiting    Oral oxycodone causes vomiting  . Chlorhexidine Gluconate Rash    Sores at site  . Zoloft [Sertraline] Hives, Swelling and Rash    MEDICATIONS: Current Outpatient Medications on File Prior to Visit  Medication Sig Dispense Refill  . acetaminophen (TYLENOL) 500 MG tablet Take 500-1,000 mg by mouth every 6 (six) hours as needed for fever or headache (or pain).    Marland Kitchen ALPRAZolam (XANAX) 0.5 MG tablet Take 1 tablet (0.5 mg total) by mouth 2 (two) times daily as needed for anxiety. 20 tablet 0  . baclofen (LIORESAL) 20 MG tablet Take 1 tablet (20 mg total) by mouth 3 (three) times daily as needed for muscle spasms. (Patient taking differently: Take 20 mg by mouth See admin instructions. Once to twice a day for  breakthrough spasms) 180 each 3  . Biotin 10000 MCG TABS Take 1,000 mcg by mouth daily.    Marland Kitchen buPROPion (WELLBUTRIN XL) 300 MG 24 hr tablet Take 1 tablet (300 mg total) by mouth daily. 30 tablet 0  . CALCIUM CITRATE PO Take 2 tablets by mouth 2 (two) times daily.    . cetirizine (ZYRTEC) 10 MG tablet Take 10 mg by mouth daily.    . Cholecalciferol (VITAMIN D) 2000 units tablet Take 1 tablet (2,000 Units total) by mouth 2 (two) times daily.  0  . co-enzyme Q-10 30 MG capsule Take 30 mg by mouth daily.    Marland Kitchen diltiazem (CARDIZEM CD) 120 MG 24 hr capsule TAKE 1 CAPSULE BY MOUTH  DAILY 90 capsule 2  . docusate sodium (COLACE) 100 MG capsule Take 1 capsule (100 mg total) by mouth daily. 10 capsule 0  . fluticasone (FLONASE) 50 MCG/ACT nasal spray Place 2 sprays into both nostrils daily as needed for allergies.     . furosemide (LASIX) 40 MG tablet Take 2 tablets (80mg ) by mouth in the morning and 1 tablet (40mg ) in the afternoon    . gabapentin (NEURONTIN) 300 MG capsule TAKE 1 CAPSULE BY MOUTH UP  TO 4 TIMES DAILY 360 capsule 3  . HP ACTHAR 80 UNIT/ML injectable gel INJECT 80 UNITS (1ML) SUBCUTANEOUSLY EVERY OTHER DAY FOR A TOTAL OF 10 DAYS. REPEAT EVERY  30 DAYS. 5 mL 5  . lactulose, encephalopathy, (GENERLAC) 10 GM/15ML SOLN Take 15 mLs (10 g total) by mouth 2 (two) times daily as needed. 946 mL 5  . levothyroxine (SYNTHROID, LEVOTHROID) 75 MCG tablet Take 75 mcg by mouth daily.    . Multiple Vitamins-Minerals (CENTRUM SILVER PO) Take 1 tablet by mouth daily.    . Nebivolol HCl (BYSTOLIC) 20 MG TABS Take 1 tablet (20 mg total) by mouth daily. 30 tablet   . Omega-3 Fatty Acids (FISH OIL) 1000 MG CAPS Take 1,000 mg by mouth daily.    Marland Kitchen omeprazole (PRILOSEC) 20 MG capsule Take 20 mg by mouth daily.     . psyllium (METAMUCIL SMOOTH TEXTURE) 28 % packet Take 1 packet by mouth every other day.    . ranitidine (ZANTAC) 150 MG tablet Take 150 mg by mouth at bedtime.     . simvastatin (ZOCOR) 20 MG tablet Take  10 mg by mouth at bedtime.     . SYRINGE/NEEDLE, DISP, 1 ML (BD LUER-LOK SYRINGE) 25G X 5/8" 1 ML MISC Use as directed to inject  Acthar 10 each prn  . Tamsulosin HCl (FLOMAX) 0.4 MG CAPS Take 0.4 mg by mouth daily.    Marland Kitchen UNABLE TO FIND Baclofen pump: Continuous    . warfarin (COUMADIN) 5 MG tablet TAKE 1/2 TO 1 TABLET BY  MOUTH DAILY AS DIRECTED BY  COUMADIN CLINIC. 90 tablet 1   Current Facility-Administered Medications on File Prior to Visit  Medication Dose Route Frequency Provider Last Rate Last Dose  . baclofen (LIORESAL) intrathecal injection 40 mg/70mL  40 mg Intrathecal Once Kathrynn Ducking, MD        PAST MEDICAL HISTORY: Past Medical History:  Diagnosis Date  . Abnormality of gait 03/01/2015  . Anxiety   . Arthritis    "knees" (01/19/2016)  . Atrial fibrillation with RVR ([place]) 01/19/2016  . Back pain   . Chest pain   . Childhood asthma   . Chronic pain    "nerve pain from the MS"  . Constipation   . Depression   . DVT (deep venous thrombosis) ([place]) 1983   "RLE; may have just been phlebitis; it was before the age of dopplers"  . 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 GERD (gastroesophageal reflux disease)   . History of stress test 06/21/10   limited exam with some degree of breast attenuatin of the apex however a component of apical ischemia is not excluded, EF 62%; cardiac cath   . HLD (hyperlipidemia)   . Hx of echocardiogram 04/22/10   EF 55-60%, no valve issues  . Hypertension   . Hypothyroidism   . Joint pain   . Migraine    "none since I stopped beta blocker in 1990s" (01/19/2016)  . Multiple sclerosis ([place])    has had this may 1989-[facility] reg doc  . OSA on CPAP   . Osteoarthritis   . PAF (paroxysmal atrial fibrillation) ([place])    on coumadin; documented on monitor 06/2010  . Pneumonia 11/2011  . Shortness of breath   . Thyroid disease     PAST SURGICAL HISTORY: Past Surgical History:  Procedure  Laterality Date  . ANTERIOR CERVICAL DECOMP/DISCECTOMY FUSION  01/2004  . BACK SURGERY    . CARDIAC CATHETERIZATION  06/22/2010   normal coronary arteries, PAF  . CATARACT EXTRACTION, BILATERAL Bilateral   . CHILD BIRTHS  X2  . DILATION AND CURETTAGE OF UTERUS    . INFUSION PUMP IMPLANTATION  ~ 2009   baclofen infusion in lower abd  . PAIN PUMP REVISION N/A 07/29/2014   Procedure: Baclofen pump replacement;  Surgeon: Erline Levine, MD;  Location: [facility];  Service: Neurosurgery;  Laterality: N/A;  Baclofen pump replacement  . TONSILLECTOMY AND ADENOIDECTOMY    . VARICOSE VEIN SURGERY  ~ 1968  . VENOUS ABLATION  12/16/2010   radiofreq ablation -Dr Elisabeth Cara and [facility]  . WISDOM TOOTH EXTRACTION      SOCIAL HISTORY: Social History   Tobacco Use  . Smoking status: Former Smoker    Packs/day: 0.50    Years: 7.00    Pack years: 3.50    Types: Cigarettes    Last attempt to quit: 08/21/1976    Years since quitting: 41.2  . Smokeless tobacco: Never Used  Substance Use Topics  . Alcohol use: Yes    Alcohol/week: 0.6 oz    Types: 1 Glasses of wine per week  . Drug use: No    FAMILY HISTORY: Family History  Problem Relation Age of Onset  . Coronary artery disease Father        at age 64  . Hyperlipidemia Father   . Thyroid disease Father   . Coronary artery disease Maternal Grandmother   . Depression Maternal Grandmother   . Cancer Paternal Grandmother   . Depression Mother   . Sudden death Mother   . Anxiety disorder Mother   . Alcoholism Son     ROS: Review of Systems  Constitutional: Positive for weight loss.    PHYSICAL EXAM: Blood pressure 120/62, pulse 94, temperature 98 F (36.7 C), temperature source Oral, height 5\' 3"  (1.6 m), weight 218 lb (98.9 kg), SpO2 99 %. Body mass index is 38.62 kg/m. Physical Exam  Constitutional: She is oriented to person, place, and time. She appears well-developed and well-nourished.  Cardiovascular: Normal rate.    Pulmonary/Chest: Effort normal.  Musculoskeletal: Normal range of motion.  Neurological: She is oriented to person, place, and time.  Skin: Skin is warm and dry.  Psychiatric: She has a normal mood and affect. Her behavior is normal.  Vitals reviewed.   RECENT LABS AND TESTS: BMET    Component Value Date/Time   NA 140 10/01/2017   K 4.3 10/01/2017   CL 99 (L) 07/08/2017 1214   CO2 31 07/08/2017 1214   GLUCOSE 92 07/08/2017 1214   BUN 27 (A) 10/01/2017   CREATININE 0.8 10/01/2017   CREATININE 0.67 07/08/2017 1214   CALCIUM 9.3 07/08/2017 1214   GFRNONAA >60 07/08/2017 1214   GFRAA >60 07/08/2017 1214   Lab Results  Component Value Date   HGBA1C 5.2 10/01/2017   HGBA1C 5.4 03/08/2017   HGBA1C  04/21/2010    5.5 (NOTE)                                                                       According to the ADA Clinical Practice Recommendations for 2011, when HbA1c is used as a screening test:   >=6.5%   Diagnostic of Diabetes Mellitus           (if abnormal result  is confirmed)  5.7-6.4%  Increased risk of developing Diabetes Mellitus  References:Diagnosis and Classification of Diabetes Mellitus,Diabetes VOHY,0737,10(GYIRS 1):S62-S69 and Standards of Medical Care in         Diabetes - 2011,Diabetes WNIO,2703,50  (Suppl 1):S11-S61.   Lab Results  Component Value Date   INSULIN 20.4 03/08/2017   CBC    Component Value Date/Time   WBC 7.0 10/01/2017   WBC 8.4 07/08/2017 1214   RBC 4.83 07/08/2017 1214   HGB 15.1 10/01/2017   HGB 14.1 03/08/2017 1152   HCT 47 (A) 10/01/2017   HCT 43.0 03/08/2017 1152   PLT 185 10/01/2017   PLT 219 12/18/2014 1157   MCV 93.2 07/08/2017 1214   MCV 93 03/08/2017 1152   MCH 31.5 07/08/2017 1214   MCHC 33.8 07/08/2017 1214   RDW 13.3 07/08/2017 1214   RDW 13.3 03/08/2017 1152   LYMPHSABS 1.6 07/08/2017 1214   LYMPHSABS 2.2 03/08/2017 1152   MONOABS 1.3 (H) 07/08/2017 1214   EOSABS 0.2 07/08/2017 1214   EOSABS 0.1 03/08/2017 1152    BASOSABS 0.0 07/08/2017 1214   BASOSABS 0.0 03/08/2017 1152   Iron/TIBC/Ferritin/ %Sat No results found for: IRON, TIBC, FERRITIN, IRONPCTSAT Lipid Panel     Component Value Date/Time   CHOL 143 03/08/2017 1152   TRIG 89 03/08/2017 1152   HDL 57 03/08/2017 1152   CHOLHDL 3.0 04/22/2010 0536   VLDL 29 04/22/2010 0536   LDLCALC 68 03/08/2017 1152   Hepatic Function Panel     Component Value Date/Time   PROT 6.7 07/08/2017 1214   PROT 6.0 04/05/2017 1450   ALBUMIN 3.8 07/08/2017 1214   ALBUMIN 3.9 04/05/2017 1450   AST 19 10/01/2017   ALT 24 10/01/2017   ALKPHOS 53 10/01/2017   BILITOT 0.8 07/08/2017 1214   BILITOT 0.2 04/05/2017 1450      Component Value Date/Time   TSH 2.410 03/08/2017 1152   TSH 1.083 08/06/2016 0647   TSH 1.871 01/19/2016 2302   TSH 0.206 (L) 03/10/2011 1351   TSH 1.636 06/18/2010 1430    ASSESSMENT AND PLAN: Other specified hypotension  Class 2 severe obesity with serious comorbidity and body mass index (BMI) of 38.0 to 38.9 in adult, unspecified obesity type ([place])  PLAN:  Hypotension Toni [name] is to decrease lasix to 40 mg q AM for the next 3 days and increase [name]. She sgreed to check blood pressure at home and call her primary care physician if no improvement by tomorrow. She also agreed to go to the emergency department if she feels any worse. Toni [name] agrees to follow up with our clinic in 2 weeks.  We spent > than 50% of the 30 minute visit on the counseling as documented in the note.  Obesity Toni [name] is currently in the action stage of change. As such, her goal is to continue with weight loss efforts She has agreed to follow the Category 2 plan Toni [name] has been instructed to work up to a goal of 150 minutes of combined cardio and strengthening exercise per week for weight loss and overall health benefits. We discussed the following Behavioral Modification Strategies today: Increase [name] intake   Nobie has agreed to follow up  with our clinic in 2 weeks. She was informed of the importance of frequent follow up visits to maximize her success with intensive lifestyle modifications for her multiple health conditions.   OBESITY BEHAVIORAL INTERVENTION VISIT  Today's visit was # 13 out of 22.  Starting weight: 257 lbs Starting date: 03/08/17 Today's weight :  218 lbs Today's date: 11/08/2017 Total lbs lost to date: 49 (Patients must lose 7 lbs in the first 6 months to continue with counseling)   ASK: We discussed the diagnosis of obesity with [name] today and [place] agreed to give [name] permission to discuss obesity behavioral modification therapy today.  ASSESS: Chynna has the diagnosis of obesity and her BMI today is 38.63 Kenedi is in the action stage of change   ADVISE: Maciel was educated on the multiple health risks of obesity as well as the benefit of weight loss to improve her health. She was advised of the need for long term treatment and the importance of lifestyle modifications.  AGREE: Multiple dietary modification options and treatment options were discussed and  Jaryah agreed to the above obesity treatment plan.  I, Trixie Dredge, am acting as transcriptionist for Dennard Nip, MD  I have reviewed the above documentation for accuracy and completeness, and I agree with the above. -Dennard Nip, MD

## 2017-11-09 ENCOUNTER — Other Ambulatory Visit: Payer: Self-pay | Admitting: Internal Medicine

## 2017-11-12 DIAGNOSIS — H04123 Dry eye syndrome of bilateral lacrimal glands: Secondary | ICD-10-CM | POA: Diagnosis not present

## 2017-11-12 DIAGNOSIS — Z961 Presence of intraocular lens: Secondary | ICD-10-CM | POA: Diagnosis not present

## 2017-11-12 DIAGNOSIS — H26491 Other secondary cataract, right eye: Secondary | ICD-10-CM | POA: Diagnosis not present

## 2017-11-13 ENCOUNTER — Telehealth: Payer: Self-pay | Admitting: *Deleted

## 2017-11-13 NOTE — Telephone Encounter (Signed)
Faxed signed orders back to [facility] re: home health POC. Fax: [phone]. Received fax confirmation.  

## 2017-11-17 DIAGNOSIS — E119 Type 2 diabetes mellitus without complications: Secondary | ICD-10-CM | POA: Diagnosis not present

## 2017-11-17 DIAGNOSIS — I480 Paroxysmal atrial fibrillation: Secondary | ICD-10-CM | POA: Diagnosis not present

## 2017-11-17 DIAGNOSIS — M48062 Spinal stenosis, lumbar region with neurogenic claudication: Secondary | ICD-10-CM | POA: Diagnosis not present

## 2017-11-17 DIAGNOSIS — I509 Heart failure, unspecified: Secondary | ICD-10-CM | POA: Diagnosis not present

## 2017-11-17 DIAGNOSIS — I110 Hypertensive heart disease with heart failure: Secondary | ICD-10-CM | POA: Diagnosis not present

## 2017-11-17 DIAGNOSIS — G35 Multiple sclerosis: Secondary | ICD-10-CM | POA: Diagnosis not present

## 2017-11-19 ENCOUNTER — Telehealth: Payer: Self-pay | Admitting: Neurology

## 2017-11-19 NOTE — Telephone Encounter (Signed)
Called the patient and informed her of Dr Dohmeier's recommendation. The patient will take a dose of Acthar and call me back tomorrow to let me know if this has helped. I inquired the pt about whether she has noticed any problems with her urine smelling funny or changes in color. The pt states that she had a urinary tract infection 2 wks ago and was treated with antibiotic. Will wait to hear from the patient tomorrow.

## 2017-11-19 NOTE — Telephone Encounter (Signed)
Patient is having MS exacerbation.

## 2017-11-19 NOTE — Telephone Encounter (Signed)
Pt called wanting to know if will need to continue taking baclofen every 6 hours, please call to advise

## 2017-11-19 NOTE — Telephone Encounter (Signed)
Take ACTHAR - and call back in 24 hours And if no relief- We will work you in , check for UTI and dehydration.

## 2017-11-19 NOTE — Telephone Encounter (Signed)
Pt states that on Saturday she started to have some spasms and despite her already having a baclofen pump she required taken a po dose. The pt said was better. Then Sunday same thing. This morning the pt states that at 7 am she was in horrible pain and was unable to move without it being painful. She feels she is loosning strength in her legs. The patient said she took her scheduled neurontin and a baclofen PO and then felt a little better after resting. She woke up around 10 am and was in the bathroom getting ready and about 12:00 it hit her again. She has taken another schedule neurontin and baclofen and it has not subsided. The patient sqealed out twice while on the phone in pain. The patient states that she would be unable to travel by car to get anywhere this is so bad it would have to be through ambulance if that was required. The pt states that on scale of 1-10 of flare up this is a 9.5. She states that she has bad reaction to steroids so she is hesitant to use steroids and is asking if Dr. Brett Fairy thinks she could just take extra dose of her Achtar med. I have informed her that I will have to run this by Dr Brett Fairy and see what she would recommend and we will call her back.

## 2017-11-20 NOTE — Telephone Encounter (Signed)
Called the patient to check in on her. Pt states that she is somewhat better but that she would like to come in and still see Dr Brett Fairy. I was able to offer her apt for 1:30 tomorrow. Pt verbalized understanding.

## 2017-11-20 NOTE — Telephone Encounter (Signed)
Patient called to ask if she should give herself Acthar shot today. Per Casey,RN, Dr. Brett Fairy advised to take another shot today.  She will be at appointment here tomorrow.Marland Kitchen

## 2017-11-21 ENCOUNTER — Encounter: Payer: Self-pay | Admitting: Neurology

## 2017-11-21 ENCOUNTER — Ambulatory Visit (INDEPENDENT_AMBULATORY_CARE_PROVIDER_SITE_OTHER): Payer: Medicare Other | Admitting: Neurology

## 2017-11-21 VITALS — BP 156/74 | HR 82 | Ht 63.0 in | Wt 218.0 lb

## 2017-11-21 DIAGNOSIS — M6289 Other specified disorders of muscle: Secondary | ICD-10-CM

## 2017-11-21 DIAGNOSIS — G4737 Central sleep apnea in conditions classified elsewhere: Secondary | ICD-10-CM

## 2017-11-21 DIAGNOSIS — M62838 Other muscle spasm: Secondary | ICD-10-CM | POA: Diagnosis not present

## 2017-11-21 DIAGNOSIS — I4891 Unspecified atrial fibrillation: Secondary | ICD-10-CM | POA: Diagnosis not present

## 2017-11-21 DIAGNOSIS — I482 Chronic atrial fibrillation: Secondary | ICD-10-CM

## 2017-11-21 DIAGNOSIS — I4821 Permanent atrial fibrillation: Secondary | ICD-10-CM

## 2017-11-21 DIAGNOSIS — G35 Multiple sclerosis: Secondary | ICD-10-CM | POA: Diagnosis not present

## 2017-11-21 NOTE — Progress Notes (Signed)
PATIENT: Toni [name] DOB: Oct 16, 1945  REASON FOR VISIT: follow up HISTORY FROM: patient  HISTORY OF PRESENT ILLNESS: Today 11/21/17, Toni [name]  has been seen in this office for MS for 30 years, was diagnosed by Dr. Rosiland [name] in [phone]. Toni [name] had several days of severe spasms, feeling tight, and she was unable to transfer, to walk with her walker. At baseline she can walk 40-50 feet. She was asked to used acthar 2 days in a row, and it seems to help, she has 6 more to go. She did not feel any improvement after taking baclofen.  She has been successful in losing a lot of weight under Dr. Migdalia [name] medical weight management program, at that clinic she needs to stay and stand still on a special scale which allows the differentiation between total weight, water weight fluid and fat content.  She is not able right now to stand still enough to allow this machine to work.  However her weight loss has been so significant that she has some loose skin now up to 18 her current weight is 218 pounds and this will certainly support her apnea treatment.  Orders the patient is here followed also for complex and central sleep apnea, the control had been important as she also developed atrial fibrillation followed by Toni [name].     Date , MM: Toni [name] is a 72 year old female with a history of multiple sclerosis and obstructive sleep apnea on BiPAP.   she returns today for follow-up.  Her BiPAP indicates that she use her machine 30 out of 30 days for compliance of 100%.  She use her machine greater than 4 hours 28 out of 30 days for compliance of 93%.  On average she uses her machine 7 hours and 14 minutes.  Her IPAP is 20 cm of water and EPAP pressure of 15 cm of water her residual AHI is 16.7.  She does not have a significant leak.  The patient reports that she has been seeing Dr. Leafy [name] for weight loss.  Reports that she is lost approximately 30 pounds.  She has an appointment this coming Wednesday.  The  patient denies any new numbness or weakness does feel like her hands is getting gradually weaker.  She states that she does participate in physical therapy 1 time a week but then does her own exercises 3 times a week.  She uses a wheelchair primarily.  She reports that she has several more months of Betaseron left but once her prescription runs out she plans to stop this medication.  She reports that taking Acthar works better for her.  She reports that she has been on Copaxone and Avonex but never any of the oral medications.  She returns today for an evaluation.  HISTORY :My long-standing patient Toni [name] has struggled trying to control her complex sleep apneas , which are central and obstructive in nature. She has started on a BiPAP setting of 20/15 cm water and has been 100% compliance for the last 30 days with an average of 7 hours and 42 minutes. The residual AHI is still 13.9 area advanced home care did call her and told her that there with 3 days consecutively during which her AHI was markedly reduced. This occurred during May, and it related to Toni [name] being given at double dose of diuretics , while she was at [facility] rehab. It was most nights after a high diuretic was given that she slept the best  and happily with congestive central apnea. In the meantime, her cardiologist, Toni [name], has increased Lasix from 60-80 mg daily but she has not had the same effect as she had at 100 mg daily. She is continuously in a fib now. No ablation as long as apnea is not treated.   Dr. Leafy [name] did an extensive lab panel,  Good HB a1c. She was low on protein- and she sees a nutritionist. Toni [name] is now cooking for her. Lots of chicken and vegetables- and she enjoys the food.   MS stable on Acthar. No diabetes on acthar and no relapses into acute a fib. Melatonin helps sleep. Given that Toni [name] has suffered from Toni [name] for almost 30 years now.   REVIEW OF SYSTEMS: Out of a complete 14 system review of  symptoms, the patient complains only of the following symptoms, and all other reviewed systems are negative. Ankle and lag edema, abdomina and back spasms. Urinary urge.  Central apneas, neurodegenerative origin. 4.5/ 15 points on depression scale.   Epworth 6 on acthar.   ALLERGIES: Allergies  Allergen Reactions  . Hydrocodone Nausea And Vomiting    Hydrocodone causes vomiting  . Oxycodone Nausea And Vomiting    Oral oxycodone causes vomiting  . Chlorhexidine Gluconate Rash    Sores at site  . Zoloft [Sertraline] Hives, Swelling and Rash    HOME MEDICATIONS: Outpatient Medications Prior to Visit  Medication Sig Dispense Refill  . acetaminophen (TYLENOL) 500 MG tablet Take 500-1,000 mg by mouth every 6 (six) hours as needed for fever or headache (or pain).    Marland Kitchen ALPRAZolam (XANAX) 0.5 MG tablet Take 1 tablet (0.5 mg total) by mouth 2 (two) times daily as needed for anxiety. 20 tablet 0  . baclofen (LIORESAL) 20 MG tablet Take 1 tablet (20 mg total) by mouth 3 (three) times daily as needed for muscle spasms. (Patient taking differently: Take 20 mg by mouth See admin instructions. Once to twice a day for breakthrough spasms) 180 each 3  . Biotin 10000 MCG TABS Take 1,000 mcg by mouth daily.    Marland Kitchen buPROPion (WELLBUTRIN XL) 300 MG 24 hr tablet Take 1 tablet (300 mg total) by mouth daily. 30 tablet 0  . CALCIUM CITRATE PO Take 2 tablets by mouth 2 (two) times daily.    . cetirizine (ZYRTEC) 10 MG tablet Take 10 mg by mouth daily.    . Cholecalciferol (VITAMIN D) 2000 units tablet Take 1 tablet (2,000 Units total) by mouth 2 (two) times daily.  0  . co-enzyme Q-10 30 MG capsule Take 30 mg by mouth daily.    Marland Kitchen diltiazem (CARDIZEM CD) 120 MG 24 hr capsule TAKE 1 CAPSULE BY MOUTH  DAILY 90 capsule 2  . docusate sodium (COLACE) 100 MG capsule Take 1 capsule (100 mg total) by mouth daily. 10 capsule 0  . fluticasone (FLONASE) 50 MCG/ACT nasal spray Place 2 sprays into both nostrils daily as needed  for allergies.     . furosemide (LASIX) 40 MG tablet Take 2 tablets (80mg ) by mouth in the morning and 1 tablet (40mg ) in the afternoon    . gabapentin (NEURONTIN) 300 MG capsule TAKE 1 CAPSULE BY MOUTH UP  TO 4 TIMES DAILY 360 capsule 3  . HP ACTHAR 80 UNIT/ML injectable gel INJECT 80 UNITS (1ML) SUBCUTANEOUSLY EVERY OTHER DAY FOR A TOTAL OF 10 DAYS. REPEAT EVERY 30 DAYS. 5 mL 5  . lactulose, encephalopathy, (GENERLAC) 10 GM/15ML SOLN Take 15 mLs (10 g total)  by mouth 2 (two) times daily as needed. 946 mL 5  . levothyroxine (SYNTHROID, LEVOTHROID) 75 MCG tablet Take 75 mcg by mouth daily.    . Multiple Vitamins-Minerals (CENTRUM SILVER PO) Take 1 tablet by mouth daily.    . Nebivolol HCl (BYSTOLIC) 20 MG TABS Take 1 tablet (20 mg total) by mouth daily. 30 tablet   . Omega-3 Fatty Acids (FISH OIL) 1000 MG CAPS Take 1,000 mg by mouth daily.    Marland Kitchen omeprazole (PRILOSEC) 20 MG capsule Take 20 mg by mouth daily.     . psyllium (METAMUCIL SMOOTH TEXTURE) 28 % packet Take 1 packet by mouth every other day.    . ranitidine (ZANTAC) 150 MG tablet Take 150 mg by mouth at bedtime.     . simvastatin (ZOCOR) 20 MG tablet Take 10 mg by mouth at bedtime.     . SYRINGE/NEEDLE, DISP, 1 ML (BD LUER-LOK SYRINGE) 25G X 5/8" 1 ML MISC Use as directed to inject  Acthar 10 each prn  . Tamsulosin HCl (FLOMAX) 0.4 MG CAPS Take 0.4 mg by mouth daily.    Marland Kitchen UNABLE TO FIND Baclofen pump: Continuous    . warfarin (COUMADIN) 5 MG tablet TAKE 1/2 TO 1 TABLET BY  MOUTH DAILY AS DIRECTED BY  COUMADIN CLINIC. 90 tablet 1   Facility-Administered Medications Prior to Visit  Medication Dose Route Frequency Provider Last Rate Last Dose  . baclofen (LIORESAL) intrathecal injection 40 mg/8mL  40 mg Intrathecal Once Kathrynn Ducking, MD        PAST MEDICAL HISTORY: Past Medical History:  Diagnosis Date  . Abnormality of gait 03/01/2015  . Anxiety   . Arthritis    "knees" (01/19/2016)  . Atrial fibrillation with RVR ([place])  01/19/2016  . Back pain   . Chest pain   . Childhood asthma   . Chronic pain    "nerve pain from the MS"  . Constipation   . Depression   . DVT (deep venous thrombosis) ([place]) 1983   "RLE; may have just been phlebitis; it was before the age of dopplers"  . 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 GERD (gastroesophageal reflux disease)   . History of stress test 06/21/10   limited exam with some degree of breast attenuatin of the apex however a component of apical ischemia is not excluded, EF 62%; cardiac cath   . HLD (hyperlipidemia)   . Hx of echocardiogram 04/22/10   EF 55-60%, no valve issues  . Hypertension   . Hypothyroidism   . Joint pain   . Migraine    "none since I stopped beta blocker in 1990s" (01/19/2016)  . Multiple sclerosis ([place])    has had this may 1989-[place] reg doc  . OSA on CPAP   . Osteoarthritis   . PAF (paroxysmal atrial fibrillation) ([place])    on coumadin; documented on monitor 06/2010  . Pneumonia 11/2011  . Shortness of breath   . Thyroid disease     PAST SURGICAL HISTORY: Past Surgical History:  Procedure Laterality Date  . ANTERIOR CERVICAL DECOMP/DISCECTOMY FUSION  01/2004  . BACK SURGERY    . CARDIAC CATHETERIZATION  06/22/2010   normal coronary arteries, PAF  . CATARACT EXTRACTION, BILATERAL Bilateral   . CHILD BIRTHS     X2  . DILATION AND CURETTAGE OF UTERUS    . INFUSION PUMP IMPLANTATION  ~ 2009   baclofen  infusion in lower abd  . PAIN PUMP REVISION N/A 07/29/2014   Procedure: Baclofen pump replacement;  Surgeon: Erline Levine, MD;  Location: [facility];  Service: Neurosurgery;  Laterality: N/A;  Baclofen pump replacement  . TONSILLECTOMY AND ADENOIDECTOMY    . VARICOSE VEIN SURGERY  ~ 1968  . VENOUS ABLATION  12/16/2010   radiofreq ablation -Dr Elisabeth Cara and [facility]  . WISDOM TOOTH EXTRACTION      FAMILY HISTORY: Family History  Problem Relation Age of Onset  . Coronary artery disease  Father        at age 4  . Hyperlipidemia Father   . Thyroid disease Father   . Coronary artery disease Maternal Grandmother   . Depression Maternal Grandmother   . Cancer Paternal Grandmother   . Depression Mother   . Sudden death Mother   . Anxiety disorder Mother   . Alcoholism Son     SOCIAL HISTORY: Social History   Socioeconomic History  . Marital status: Married    Spouse name: Toni [name]  . Number of children: 2  . Years of education: COLLEGE  . Highest education level: Not on file  Occupational History  . Occupation: RETIRED  Social Needs  . Financial resource strain: Not on file  . Food insecurity:    Worry: Not on file    Inability: Not on file  . Transportation needs:    Medical: Not on file    Non-medical: Not on file  Tobacco Use  . Smoking status: Former Smoker    Packs/day: 0.50    Years: 7.00    Pack years: 3.50    Types: Cigarettes    Last attempt to quit: 08/21/1976    Years since quitting: 41.2  . Smokeless tobacco: Never Used  Substance and Sexual Activity  . Alcohol use: Yes    Alcohol/week: 0.6 [name]    Types: 1 Glasses of wine per week  . Drug use: No  . Sexual activity: Not Currently  Lifestyle  . Physical activity:    Days per week: Not on file    Minutes per session: Not on file  . Stress: Not on file  Relationships  . Social connections:    Talks on phone: Not on file    Gets together: Not on file    Attends religious service: Not on file    Active member of club or organization: Not on file    Attends meetings of clubs or organizations: Not on file    Relationship status: Not on file  . Intimate partner violence:    Fear of current or ex partner: Not on file    Emotionally abused: Not on file    Physically abused: Not on file    Forced sexual activity: Not on file  Other Topics Concern  . Not on file  Social History Narrative   Lives in [place] with Tillman Sers [phone], [phone]   Currently in rehab      PHYSICAL  EXAM  Vitals:   11/21/17 1333  BP: (!) 156/74  Pulse: 82  Weight: 218 lb (98.9 kg)  Height: 5\' 3"  (1.6 m)   Body mass index is 38.62 kg/m.  Generalized: Well developed, in no acute distress, wheelchair    irregular heart beat, a fib.  Excess skin after weight loss. bilateral edema up to the knees. Broken blood vessels.  Lungs are clear to auscultation.   Neurological examination :  Mentation: Alert oriented to time, place, history taking. Follows all commands  speech and language fluent Cranial nerve : Pupils were equal round reactive to light. Extraocular movements were full, visual field were full on confrontational test. Facial sensation and strength were normal. Tongue midline. Head turning and shoulder shrug  were normal and symmetric. Motor: The patient cannot fully extend her right upper extremity at the elbow, but she has good biceps flexion strength.  Grip strength is weak and bilaterally there is atrophy of the thenar eminence noted.  Her fingers feel stiff, she noted an abdominal and chest spasm, she is unable right now to walk has lower back pain and feels generalized weak and heavy.  After has given her some energy back.  As to sensory she noticed ongoing numbness in the fingers of both hands the pounds but no numbness in the legs.  Loss of sensation at the feet and ankle which may be related to the significant edema.  We did not ambulate .  DIAGNOSTIC DATA (LABS, IMAGING, TESTING) - I reviewed patient records, labs, notes, testing and imaging myself where available.  Lab Results  Component Value Date   WBC 7.0 10/01/2017   HGB 15.1 10/01/2017   HCT 47 (A) 10/01/2017   MCV 93.2 07/08/2017   PLT 185 10/01/2017      Component Value Date/Time   NA 140 10/01/2017   K 4.3 10/01/2017   CL 99 (L) 07/08/2017 1214   CO2 31 07/08/2017 1214   GLUCOSE 92 07/08/2017 1214   BUN 27 (A) 10/01/2017   CREATININE 0.8 10/01/2017   CREATININE 0.67 07/08/2017 1214   CALCIUM 9.3  07/08/2017 1214   PROT 6.7 07/08/2017 1214   PROT 6.0 04/05/2017 1450   ALBUMIN 3.8 07/08/2017 1214   ALBUMIN 3.9 04/05/2017 1450   AST 19 10/01/2017   ALT 24 10/01/2017   ALKPHOS 53 10/01/2017   BILITOT 0.8 07/08/2017 1214   BILITOT 0.2 04/05/2017 1450   GFRNONAA >60 07/08/2017 1214   GFRAA >60 07/08/2017 1214   Lab Results  Component Value Date   CHOL 143 03/08/2017   HDL 57 03/08/2017   LDLCALC 68 03/08/2017   TRIG 89 03/08/2017   CHOLHDL 3.0 04/22/2010   Lab Results  Component Value Date   HGBA1C 5.2 10/01/2017   No results found for: VITAMINB12 Lab Results  Component Value Date   TSH 2.410 03/08/2017      ASSESSMENT AND PLAN 72 y.o. year old female  has a past medical history of Abnormality of gait (03/01/2015), Anxiety, Arthritis, Atrial fibrillation with RVR ([place]) (01/19/2016), Back pain, Chest pain, Childhood asthma, Chronic pain, Constipation, Depression, DVT (deep venous thrombosis) ([place]) (1983), Edema, GERD (gastroesophageal reflux disease), History of stress test (06/21/10), HLD (hyperlipidemia), echocardiogram (04/22/10), Hypertension, Hypothyroidism, Joint pain, Migraine, Multiple sclerosis ([place]), OSA on CPAP, Osteoarthritis, PAF (paroxysmal atrial fibrillation) ([place]), Pneumonia (11/2011), Shortness of breath, and Thyroid disease. here with:  1.  COMPLEX sleep apnea on BiPAP 2.  Multiple sclerosis on acthar, with gait problems, sensory abnormalities and " MS hugs" . Her legs are tight. May be she can use a higher dose of baclofen/ continues on neurontin.  3. Morbid obesity Dr Toni [name] follows.  4. Atrial fibrillation, chronic and persistent .    She will continue on BiPAP therapy. The patient will continue on Betaseron- and  she will remain on Acthar. Dr [name] will try an increase of baclofen application by pump.      She is advised that if her symptoms worsen or she develops new symptoms she should let us  know.  She will follow-up in May  months with me,  Dr.  Brett Fairy. and in 6 month with NP.   Visit was 25 minutes long and addressed 4 problems.   [name], MD  11/21/2017, 1:52 PM [facility] [address], [place], [place] [address] [phone]

## 2017-11-22 DIAGNOSIS — E119 Type 2 diabetes mellitus without complications: Secondary | ICD-10-CM | POA: Diagnosis not present

## 2017-11-22 DIAGNOSIS — I110 Hypertensive heart disease with heart failure: Secondary | ICD-10-CM | POA: Diagnosis not present

## 2017-11-22 DIAGNOSIS — N3020 Other chronic cystitis without hematuria: Secondary | ICD-10-CM | POA: Diagnosis not present

## 2017-11-22 DIAGNOSIS — I509 Heart failure, unspecified: Secondary | ICD-10-CM | POA: Diagnosis not present

## 2017-11-22 DIAGNOSIS — M48062 Spinal stenosis, lumbar region with neurogenic claudication: Secondary | ICD-10-CM | POA: Diagnosis not present

## 2017-11-22 DIAGNOSIS — I480 Paroxysmal atrial fibrillation: Secondary | ICD-10-CM | POA: Diagnosis not present

## 2017-11-22 DIAGNOSIS — G35 Multiple sclerosis: Secondary | ICD-10-CM | POA: Diagnosis not present

## 2017-11-22 DIAGNOSIS — N312 Flaccid neuropathic bladder, not elsewhere classified: Secondary | ICD-10-CM | POA: Diagnosis not present

## 2017-11-23 ENCOUNTER — Telehealth: Payer: Self-pay | Admitting: Neurology

## 2017-11-23 NOTE — Telephone Encounter (Signed)
I spoke with Dr. [name], he is agreeable to working pt in at 4:30pm on 11/29/17, as long as it is a pump refill.  I called pt, she says that her pump needs to be "re-programmed" to increase the baclofen going to her spine, and it will only take a few minutes. Pt can make the appt on 11/29/17 at 4:30pm with Dr. [name].

## 2017-11-23 NOTE — Telephone Encounter (Signed)
There are no openings at this time. Will continue to watch for openings.

## 2017-11-23 NOTE — Telephone Encounter (Signed)
Pt asking for a call from Dr Tobey Grim RN, she will not be able to come in [place].  She is asking to be able to come late afternoon on Thursday around 4:00 please call

## 2017-11-25 IMAGING — MR MR HEAD WO/W CM
8 of 24 series · 26 of 48 positions shown · IV contrast (multihance)
Comparison: 04/22/2010 brain MRI.  03/04/2010 cervical spine MRI.

CLINICAL DATA: Multiple sclerosis. Acute onset right handed
weakness.

EXAM:
MRI HEAD WITHOUT AND WITH CONTRAST
MRI CERVICAL SPINE WITHOUT AND WITH CONTRAST
TECHNIQUE: Multiplanar, multiecho pulse sequences of the brain and surrounding
structures, and cervical spine, to include the craniocervical
junction and cervicothoracic junction, were obtained without and
with intravenous contrast.
CONTRAST:  20 cc MultiHance intravenous

[Series 4: DWI · axial · 3.0mm · 1.09mm/px · z∈[+21,+165]mm · 5 of 98 slices shown (1 of 4)]
[im 1/98]
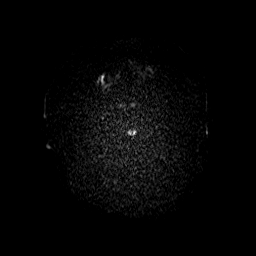
[im 25/98]
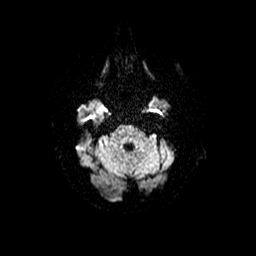
[im 49/98]
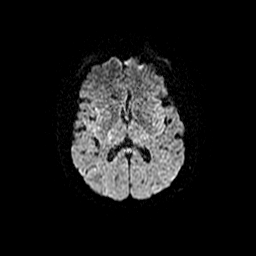
[im 73/98]
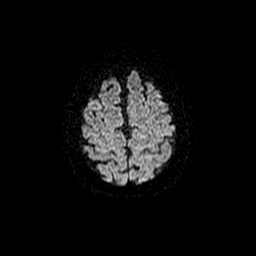
[im 98/98]
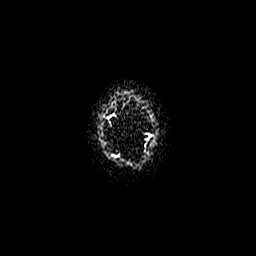

[Series 7: DWI · coronal · 5.0mm · 1.09mm/px · 3 of 68 slices shown (2 of 4)]
[im 1/68]
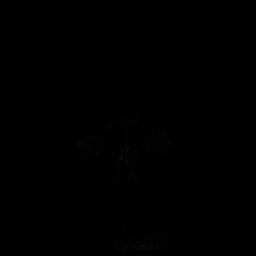
[im 34/68]
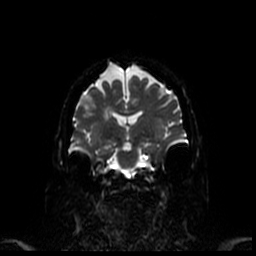
[im 68/68]
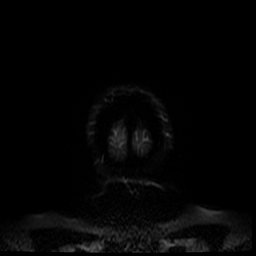

[Series 8: FLAIR · sagittal · 1.2mm · 0.49mm/px · 11 of 240 slices shown (1 of 2)]
[im 1/240]
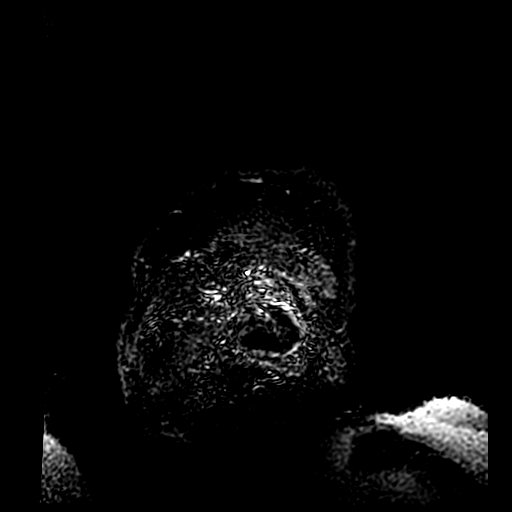
[im 24/240]
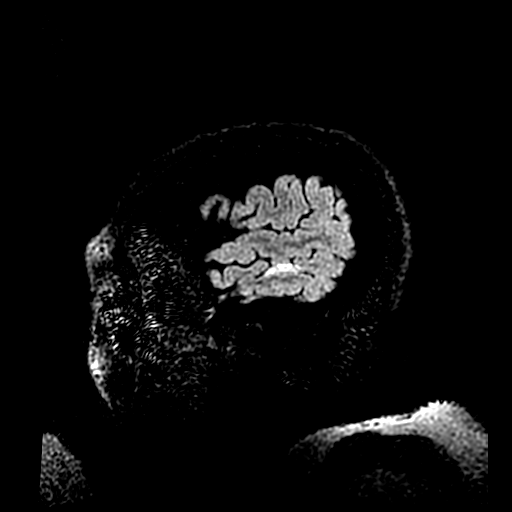
[im 48/240]
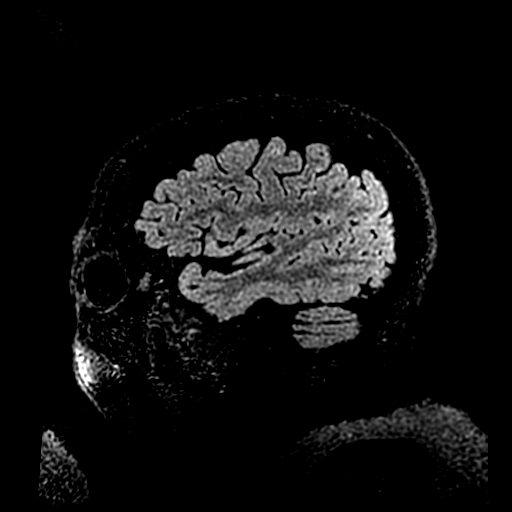
[im 72/240]
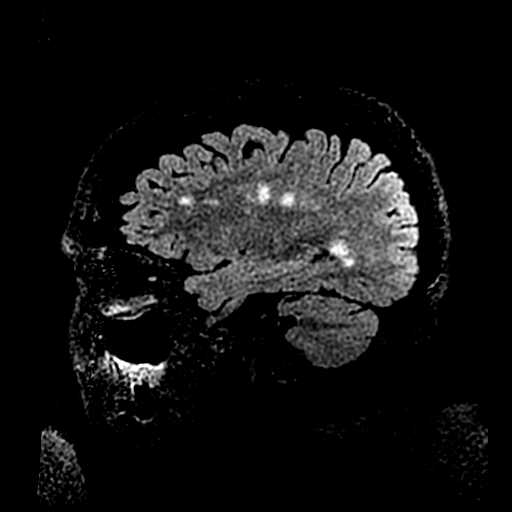
[im 96/240]
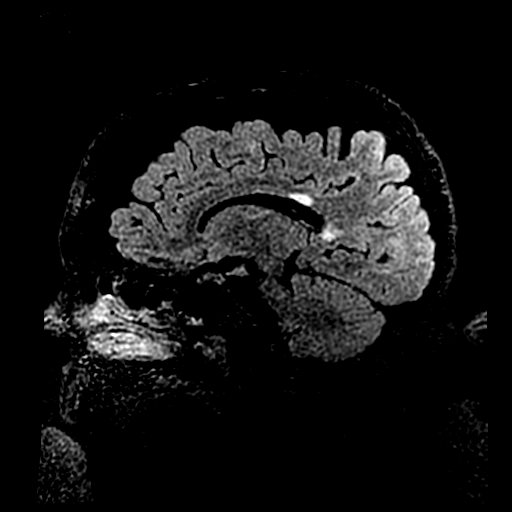
[im 120/240]
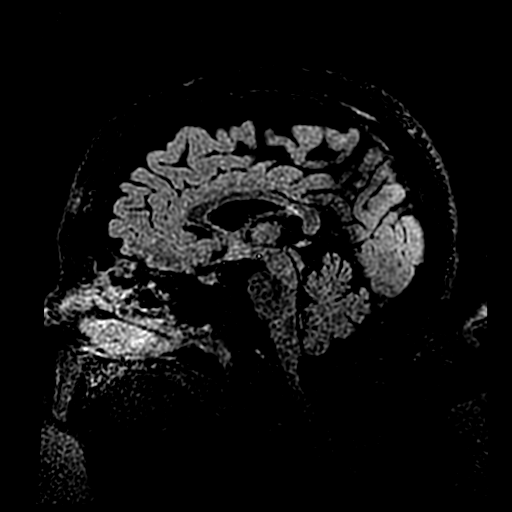
[im 144/240]
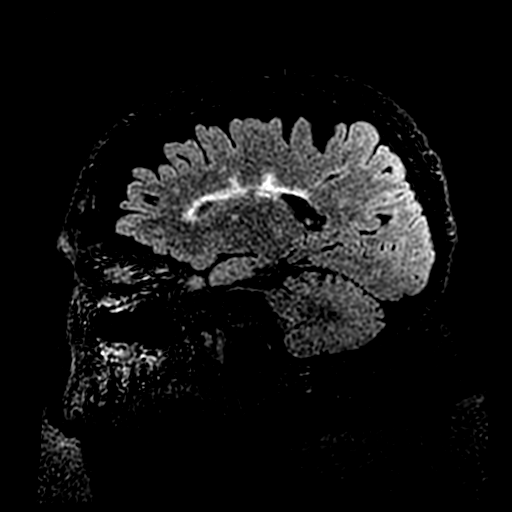
[im 168/240]
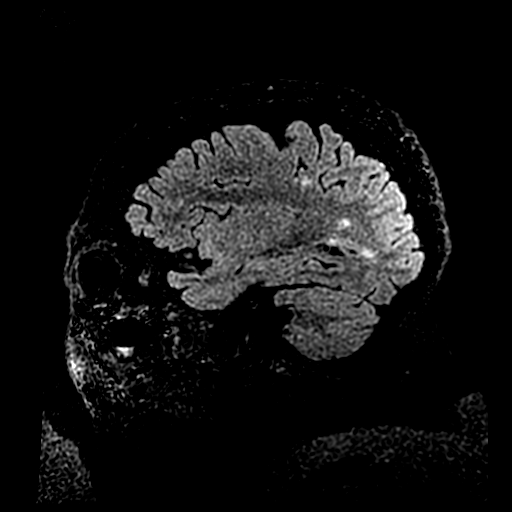
[im 192/240]
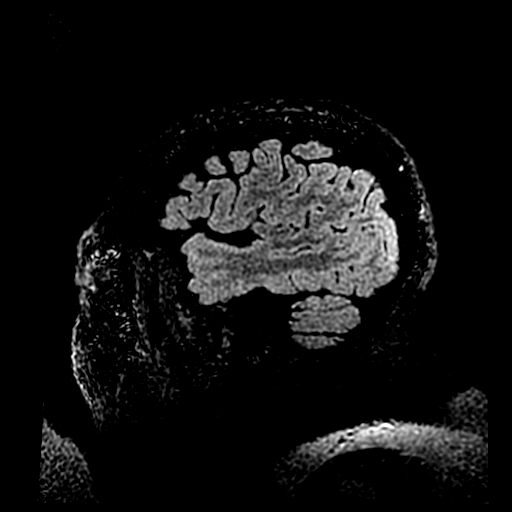
[im 216/240]
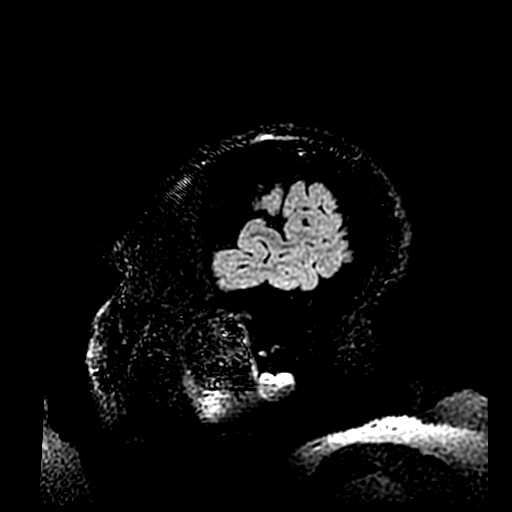
[im 240/240]
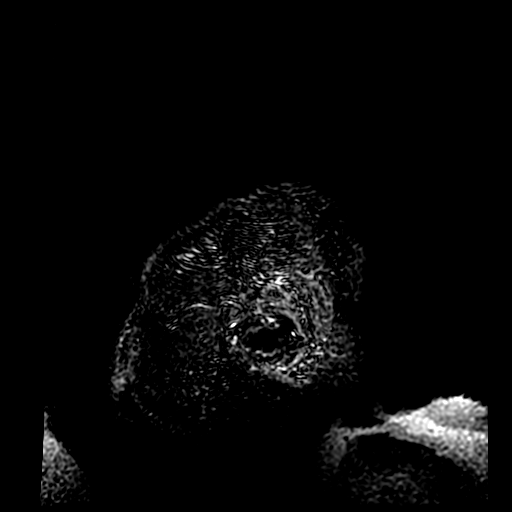

[Series 9: FLAIR · axial · 5.0mm · 0.45mm/px · 1 of 27 slices shown (2 of 2)]
[im 1/27]
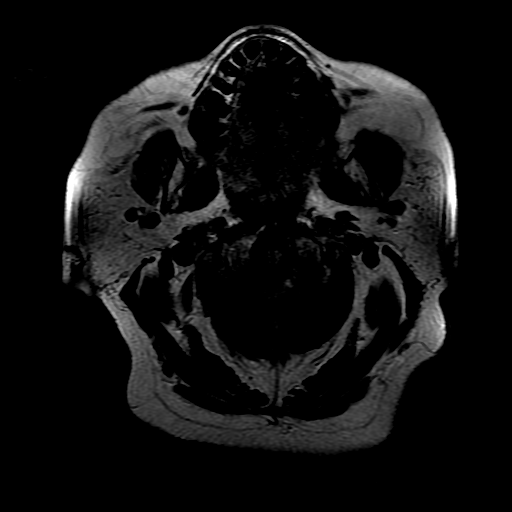

[Series 21: T1 post-contrast · axial · 3.0mm · 0.39mm/px · 1 of 28 slices shown (1 of 2)]
[im 1/28]
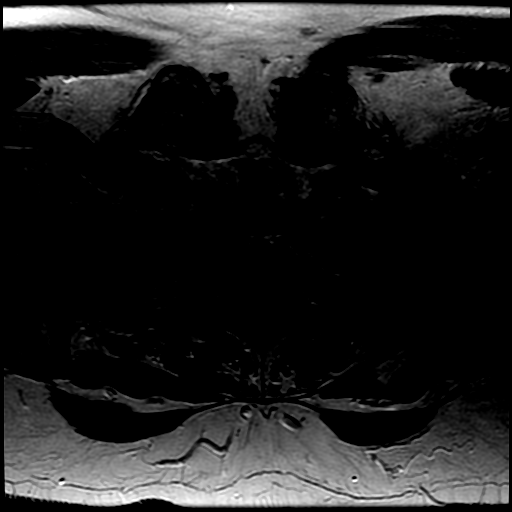

[Series 23: T1 post-contrast · coronal · 5.0mm · 0.39mm/px · 1 of 25 slices shown (2 of 2)]
[im 1/25]
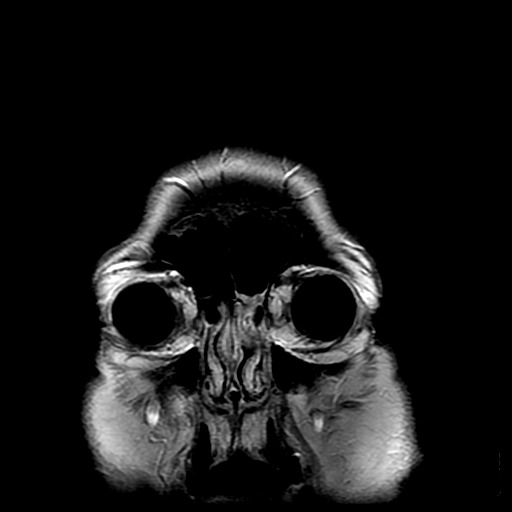

[Series 400: DWI · axial · 3.0mm · 1.09mm/px · z∈[+21,+165]mm · 2 of 49 slices shown (3 of 4)]
[im 1/49]
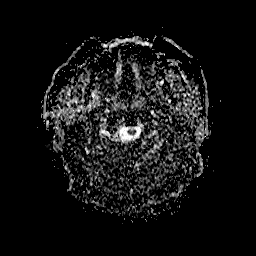
[im 49/49]
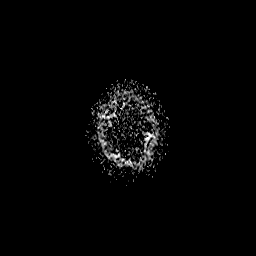

[Series 700: DWI · coronal · 5.0mm · 1.09mm/px · 2 of 34 slices shown (4 of 4)]
[im 1/34]
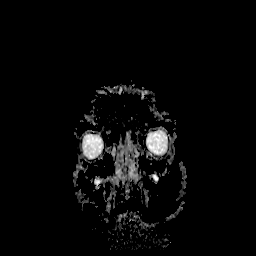
[im 34/34]
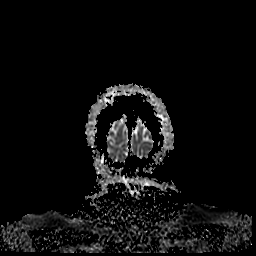

[26 of 48 positions shown; findings below may reference images not displayed]

FINDINGS: MRI HEAD FINDINGS

Brain: History of multiple sclerosis with correlative
periventricular predominant white matter lesions, also affecting the
juxta cortical white matter. No infratentorial signal abnormalities
are noted. There is no detected progression. No restricted diffusion
or enhancement to suggest active demyelination. No acute infarct,
hemorrhage, hydrocephalus, or mass. Stable cerebral volume. Anti
dependent foci at of fat signal (in density by CT) in the bilateral
frontal horns of the lateral ventricles and around the vermis are
stable. There is no enhancement or edema to suggest ventriculitis or
meningitis.

Vascular: Normal flow voids.

Skull and upper cervical spine: Normal marrow signal

Sinuses/Orbits: Negative

MRI CERVICAL SPINE FINDINGS

Alignment: Chronic kyphotic deformity, fixed by fusion.

Vertebrae: No fracture, evidence of discitis, or bone lesion.

Cord: There is short-segment peripheral cord T2 hyperintensity on
the left at C3-4 and on the right at C6-7. These could have been
present on the previous study, but obscured by hardware and motion.
Stable cord volume. No abnormal enhancement.

Posterior Fossa, vertebral arteries, paraspinal tissues: Negative

Disc levels:

C2-3: Facet arthropathy with prominent spurring. Mild disc
narrowing. No impingement

C3-4: ACDF with solid bony fusion.  Patent canal and foramina

C4-5: ACDF with solid bony fusion. Residual effacement of the left
ventral subarachnoid space from endplate ridge. Mild residual
bilateral foraminal stenosis from uncovertebral spurs.

C5-6: ACDF with solid bony fusion. No evidence of residual
impingement

C6-7: ACDF with solid bony fusion. No evidence of residual
impingement

C7-T1:Downward migrating central disc protrusion without
impingement.
IMPRESSION: 1. Multiple sclerosis with stable intracranial plaque burden and
brain volume compared to 2600. No evidence of active demyelination.
2. Two probable cord lesions, peripherally on the left at C3-4 and
on the right at C6-7. These could be chronic given the degree of
artifact on 2600 comparison. No cord enhancement typical of acute
demyelination.
3. C3-C7 ACDF with solid bony fusion. No degenerative impingement to
explain acute right hand symptoms.
4. Subarachnoid/intraventricular fat that is stable from 2600.

## 2017-11-26 ENCOUNTER — Ambulatory Visit: Payer: Medicare Other | Admitting: Neurology

## 2017-11-27 ENCOUNTER — Ambulatory Visit (INDEPENDENT_AMBULATORY_CARE_PROVIDER_SITE_OTHER): Payer: Medicare Other | Admitting: Pharmacist Clinician (PhC)/ Clinical Pharmacy Specialist

## 2017-11-27 ENCOUNTER — Ambulatory Visit (INDEPENDENT_AMBULATORY_CARE_PROVIDER_SITE_OTHER): Payer: Medicare Other | Admitting: Physician Assistant

## 2017-11-27 VITALS — BP 111/71 | HR 89 | Ht 63.0 in | Wt 224.0 lb

## 2017-11-27 DIAGNOSIS — F418 Other specified anxiety disorders: Secondary | ICD-10-CM | POA: Diagnosis not present

## 2017-11-27 DIAGNOSIS — F3289 Other specified depressive episodes: Secondary | ICD-10-CM | POA: Diagnosis not present

## 2017-11-27 DIAGNOSIS — I482 Chronic atrial fibrillation: Secondary | ICD-10-CM

## 2017-11-27 DIAGNOSIS — I110 Hypertensive heart disease with heart failure: Secondary | ICD-10-CM | POA: Diagnosis not present

## 2017-11-27 DIAGNOSIS — I5032 Chronic diastolic (congestive) heart failure: Secondary | ICD-10-CM | POA: Diagnosis not present

## 2017-11-27 DIAGNOSIS — Z6839 Body mass index (BMI) 39.0-39.9, adult: Secondary | ICD-10-CM

## 2017-11-27 DIAGNOSIS — G35 Multiple sclerosis: Secondary | ICD-10-CM | POA: Diagnosis not present

## 2017-11-27 DIAGNOSIS — E559 Vitamin D deficiency, unspecified: Secondary | ICD-10-CM

## 2017-11-27 DIAGNOSIS — Z7901 Long term (current) use of anticoagulants: Secondary | ICD-10-CM

## 2017-11-27 DIAGNOSIS — I480 Paroxysmal atrial fibrillation: Secondary | ICD-10-CM | POA: Diagnosis not present

## 2017-11-27 DIAGNOSIS — F329 Major depressive disorder, single episode, unspecified: Secondary | ICD-10-CM | POA: Diagnosis not present

## 2017-11-27 DIAGNOSIS — I509 Heart failure, unspecified: Secondary | ICD-10-CM | POA: Diagnosis not present

## 2017-11-27 DIAGNOSIS — E7849 Other hyperlipidemia: Secondary | ICD-10-CM

## 2017-11-27 DIAGNOSIS — E038 Other specified hypothyroidism: Secondary | ICD-10-CM | POA: Diagnosis not present

## 2017-11-27 DIAGNOSIS — F419 Anxiety disorder, unspecified: Secondary | ICD-10-CM | POA: Diagnosis not present

## 2017-11-27 DIAGNOSIS — G4733 Obstructive sleep apnea (adult) (pediatric): Secondary | ICD-10-CM | POA: Diagnosis not present

## 2017-11-27 DIAGNOSIS — M48062 Spinal stenosis, lumbar region with neurogenic claudication: Secondary | ICD-10-CM | POA: Diagnosis not present

## 2017-11-27 DIAGNOSIS — E785 Hyperlipidemia, unspecified: Secondary | ICD-10-CM | POA: Diagnosis not present

## 2017-11-27 DIAGNOSIS — E119 Type 2 diabetes mellitus without complications: Secondary | ICD-10-CM | POA: Diagnosis not present

## 2017-11-27 DIAGNOSIS — I4821 Permanent atrial fibrillation: Secondary | ICD-10-CM

## 2017-11-27 LAB — POCT INR: INR: 3.9

## 2017-11-27 NOTE — Progress Notes (Signed)
Office: [phone]  /  Fax: [phone]   HPI:   Chief Complaint: OBESITY Toni [name] is here to discuss her progress with her obesity treatment plan. She is on the Category 2 plan and is following her eating plan approximately 90 % of the time. She states she is exercising 0 minutes 0 times per week. Toni [name] is unable to stand on the scale for long, so we were unable to judge her volume status, as scale does not provide water weight. She states she has been keeping up with her protein intake and her hunger is well controlled. Her weight is 224 lb (101.6 kg) today and has had a weight gain of 6 pounds over a period of 2 weeks since her last visit. She has lost 33 lbs since starting treatment with [name].  Chronic diastolic congestion heart failure We were unable to judge volume status on physical exam, however, she is up 6 pounds since her last visit. She also states she has noticed increased swelling of her legs and noticed a decrease in urination. She denies chest pain or dyspnea.  Diabetes II non insulin without complications Khanh has a diagnosis of diabetes type II. Toni [name] is not checking her blood sugar at home and she is not on any medications. Toni [name] declines metformin today. Toni [name] denies any hypoglycemic episodes. She has been working on intensive lifestyle modifications including diet, exercise, and weight loss to help control her blood glucose levels.  Hypothyroidism Toni [name] has a diagnosis of hypothyroidism. She is on synthroid. She denies hot or cold intolerance or palpitations, but does admit to ongoing fatigue.  Vitamin D deficiency Toni [name] has a diagnosis of vitamin D deficiency. She is currently on multi vitamin and her level is stable in the 50's. She denies nausea, vomiting or muscle weakness.   Hyperlipidemia Toni [name] has hyperlipidemia and is on simvastatin. She has been trying to improve her cholesterol levels with intensive lifestyle modification  including a low saturated fat diet, exercise and weight loss. She denies any chest pain and admits muscle spasms and muscle cramps, thought to be secondary to multiple sclerosis.  ALLERGIES: Allergies  Allergen Reactions  . Hydrocodone Nausea And Vomiting    Hydrocodone causes vomiting  . Oxycodone Nausea And Vomiting    Oral oxycodone causes vomiting  . Chlorhexidine Gluconate Rash    Sores at site  . Zoloft [Sertraline] Hives, Swelling and Rash    MEDICATIONS: Current Outpatient Medications on File Prior to Visit  Medication Sig Dispense Refill  . acetaminophen (TYLENOL) 500 MG tablet Take 500-1,000 mg by mouth every 6 (six) hours as needed for fever or headache (or pain).    Marland Kitchen ALPRAZolam (XANAX) 0.5 MG tablet Take 1 tablet (0.5 mg total) by mouth 2 (two) times daily as needed for anxiety. 20 tablet 0  . baclofen (LIORESAL) 20 MG tablet Take 1 tablet (20 mg total) by mouth 3 (three) times daily as needed for muscle spasms. (Patient taking differently: Take 20 mg by mouth See admin instructions. Once to twice a day for breakthrough spasms) 180 each 3  . Biotin 10000 MCG TABS Take 1,000 mcg by mouth daily.    Marland Kitchen buPROPion (WELLBUTRIN XL) 300 MG 24 hr tablet Take 1 tablet (300 mg total) by mouth daily. 30 tablet 0  . CALCIUM CITRATE PO Take 2 tablets by mouth 2 (two) times daily.    . cetirizine (ZYRTEC) 10 MG tablet Take 10 mg by mouth daily.    . Cholecalciferol (VITAMIN D) 2000 units tablet  Take 1 tablet (2,000 Units total) by mouth 2 (two) times daily.  0  . co-enzyme Q-10 30 MG capsule Take 30 mg by mouth daily.    Marland Kitchen diltiazem (CARDIZEM CD) 120 MG 24 hr capsule TAKE 1 CAPSULE BY MOUTH  DAILY 90 capsule 2  . docusate sodium (COLACE) 100 MG capsule Take 1 capsule (100 mg total) by mouth daily. 10 capsule 0  . fluticasone (FLONASE) 50 MCG/ACT nasal spray Place 2 sprays into both nostrils daily as needed for allergies.     . furosemide (LASIX) 40 MG tablet Take 2 tablets (80mg ) by mouth  in the morning and 1 tablet (40mg ) in the afternoon    . gabapentin (NEURONTIN) 300 MG capsule TAKE 1 CAPSULE BY MOUTH UP  TO 4 TIMES DAILY 360 capsule 3  . HP ACTHAR 80 UNIT/ML injectable gel INJECT 80 UNITS (1ML) SUBCUTANEOUSLY EVERY OTHER DAY FOR A TOTAL OF 10 DAYS. REPEAT EVERY 30 DAYS. 5 mL 5  . lactulose, encephalopathy, (GENERLAC) 10 GM/15ML SOLN Take 15 mLs (10 g total) by mouth 2 (two) times daily as needed. 946 mL 5  . levothyroxine (SYNTHROID, LEVOTHROID) 75 MCG tablet Take 75 mcg by mouth daily.    . Multiple Vitamins-Minerals (CENTRUM SILVER PO) Take 1 tablet by mouth daily.    . Nebivolol HCl (BYSTOLIC) 20 MG TABS Take 1 tablet (20 mg total) by mouth daily. 30 tablet   . Omega-3 Fatty Acids (FISH OIL) 1000 MG CAPS Take 1,000 mg by mouth daily.    Marland Kitchen omeprazole (PRILOSEC) 20 MG capsule Take 20 mg by mouth daily.     . psyllium (METAMUCIL SMOOTH TEXTURE) 28 % packet Take 1 packet by mouth every other day.    . ranitidine (ZANTAC) 150 MG tablet Take 150 mg by mouth at bedtime.     . simvastatin (ZOCOR) 20 MG tablet Take 10 mg by mouth at bedtime.     . SYRINGE/NEEDLE, DISP, 1 ML (BD LUER-LOK SYRINGE) 25G X 5/8" 1 ML MISC Use as directed to inject  Acthar 10 each prn  . Tamsulosin HCl (FLOMAX) 0.4 MG CAPS Take 0.4 mg by mouth daily.    Marland Kitchen UNABLE TO FIND Baclofen pump: Continuous    . warfarin (COUMADIN) 5 MG tablet TAKE 1/2 TO 1 TABLET BY  MOUTH DAILY AS DIRECTED BY  COUMADIN CLINIC. 90 tablet 1   Current Facility-Administered Medications on File Prior to Visit  Medication Dose Route Frequency Provider Last Rate Last Dose  . baclofen (LIORESAL) intrathecal injection 40 mg/58mL  40 mg Intrathecal Once Kathrynn Ducking, MD        PAST MEDICAL HISTORY: Past Medical History:  Diagnosis Date  . Abnormality of gait 03/01/2015  . Anxiety   . Arthritis    "knees" (01/19/2016)  . Atrial fibrillation with RVR ([place]) 01/19/2016  . Back pain   . Chest pain   . Childhood asthma   . Chronic  pain    "nerve pain from the MS"  . Constipation   . Depression   . DVT (deep venous thrombosis) ([place]) 1983   "RLE; may have just been phlebitis; it was before the age of dopplers"  . 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 GERD (gastroesophageal reflux disease)   . History of stress test 06/21/10   limited exam with some degree of breast attenuatin of the apex however a component of apical ischemia is  not excluded, EF 62%; cardiac cath   . HLD (hyperlipidemia)   . Hx of echocardiogram 04/22/10   EF 55-60%, no valve issues  . Hypertension   . Hypothyroidism   . Joint pain   . Migraine    "none since I stopped beta blocker in 1990s" (01/19/2016)  . Multiple sclerosis ([place])    has had this may 1989-[facility] reg doc  . OSA on CPAP   . Osteoarthritis   . PAF (paroxysmal atrial fibrillation) ([place])    on coumadin; documented on monitor 06/2010  . Pneumonia 11/2011  . Shortness of breath   . Thyroid disease     PAST SURGICAL HISTORY: Past Surgical History:  Procedure Laterality Date  . ANTERIOR CERVICAL DECOMP/DISCECTOMY FUSION  01/2004  . BACK SURGERY    . CARDIAC CATHETERIZATION  06/22/2010   normal coronary arteries, PAF  . CATARACT EXTRACTION, BILATERAL Bilateral   . CHILD BIRTHS     X2  . DILATION AND CURETTAGE OF UTERUS    . INFUSION PUMP IMPLANTATION  ~ 2009   baclofen infusion in lower abd  . PAIN PUMP REVISION N/A 07/29/2014   Procedure: Baclofen pump replacement;  Surgeon: Erline Levine, MD;  Location: [facility];  Service: Neurosurgery;  Laterality: N/A;  Baclofen pump replacement  . TONSILLECTOMY AND ADENOIDECTOMY    . VARICOSE VEIN SURGERY  ~ 1968  . VENOUS ABLATION  12/16/2010   radiofreq ablation -Dr Elisabeth Cara and [facility]  . WISDOM TOOTH EXTRACTION      SOCIAL HISTORY: Social History   Tobacco Use  . Smoking status: Former Smoker    Packs/day: 0.50    Years: 7.00    Pack years: 3.50    Types: Cigarettes     Last attempt to quit: 08/21/1976    Years since quitting: 41.2  . Smokeless tobacco: Never Used  Substance Use Topics  . Alcohol use: Yes    Alcohol/week: 0.6 oz    Types: 1 Glasses of wine per week  . Drug use: No    FAMILY HISTORY: Family History  Problem Relation Age of Onset  . Coronary artery disease Father        at age 74  . Hyperlipidemia Father   . Thyroid disease Father   . Coronary artery disease Maternal Grandmother   . Depression Maternal Grandmother   . Cancer Paternal Grandmother   . Depression Mother   . Sudden death Mother   . Anxiety disorder Mother   . Alcoholism Son     ROS: Review of Systems  Constitutional: Positive for malaise/fatigue. Negative for weight loss.  Respiratory: Negative for shortness of breath.   Cardiovascular: Negative for chest pain and palpitations.  Gastrointestinal: Negative for nausea and vomiting.  Musculoskeletal:       Negative for muscle weakness Positive for muscle spasms Positive for muscle cramps  Endo/Heme/Allergies:       Negative for heat or cold intolerance    PHYSICAL EXAM: Blood pressure 111/71, pulse 89, height 5\' 3"  (1.6 m), weight 224 lb (101.6 kg), SpO2 97 %. Body mass index is 39.68 kg/m. Physical Exam  Constitutional: She is oriented to person, place, and time. She appears well-developed and well-nourished.  Cardiovascular: Normal rate.  Pulmonary/Chest: Effort normal.  Musculoskeletal: Normal range of motion.  Neurological: She is oriented to person, place, and time.  Skin: Skin is warm and dry.  Psychiatric: She has a normal mood and affect. Her behavior is normal.  Vitals reviewed.   RECENT LABS AND  TESTS: BMET    Component Value Date/Time   NA 140 10/01/2017   K 4.3 10/01/2017   CL 99 (L) 07/08/2017 1214   CO2 31 07/08/2017 1214   GLUCOSE 92 07/08/2017 1214   BUN 27 (A) 10/01/2017   CREATININE 0.8 10/01/2017   CREATININE 0.67 07/08/2017 1214   CALCIUM 9.3 07/08/2017 1214   GFRNONAA >60  07/08/2017 1214   GFRAA >60 07/08/2017 1214   Lab Results  Component Value Date   HGBA1C 5.2 10/01/2017   HGBA1C 5.4 03/08/2017   HGBA1C  04/21/2010    5.5 (NOTE)                                                                       According to the ADA Clinical Practice Recommendations for 2011, when HbA1c is used as a screening test:   >=6.5%   Diagnostic of Diabetes Mellitus           (if abnormal result  is confirmed)  5.7-6.4%   Increased risk of developing Diabetes Mellitus  References:Diagnosis and Classification of Diabetes Mellitus,Diabetes FBPZ,0258,52(DPOEU 1):S62-S69 and Standards of Medical Care in         Diabetes - 2011,Diabetes Care,2011,34  (Suppl 1):S11-S61.   Lab Results  Component Value Date   INSULIN 20.4 03/08/2017   CBC    Component Value Date/Time   WBC 7.0 10/01/2017   WBC 8.4 07/08/2017 1214   RBC 4.83 07/08/2017 1214   HGB 15.1 10/01/2017   HGB 14.1 03/08/2017 1152   HCT 47 (A) 10/01/2017   HCT 43.0 03/08/2017 1152   PLT 185 10/01/2017   PLT 219 12/18/2014 1157   MCV 93.2 07/08/2017 1214   MCV 93 03/08/2017 1152   MCH 31.5 07/08/2017 1214   MCHC 33.8 07/08/2017 1214   RDW 13.3 07/08/2017 1214   RDW 13.3 03/08/2017 1152   LYMPHSABS 1.6 07/08/2017 1214   LYMPHSABS 2.2 03/08/2017 1152   MONOABS 1.3 (H) 07/08/2017 1214   EOSABS 0.2 07/08/2017 1214   EOSABS 0.1 03/08/2017 1152   BASOSABS 0.0 07/08/2017 1214   BASOSABS 0.0 03/08/2017 1152   Iron/TIBC/Ferritin/ %Sat No results found for: IRON, TIBC, FERRITIN, IRONPCTSAT Lipid Panel     Component Value Date/Time   CHOL 143 03/08/2017 1152   TRIG 89 03/08/2017 1152   HDL 57 03/08/2017 1152   CHOLHDL 3.0 04/22/2010 0536   VLDL 29 04/22/2010 0536   LDLCALC 68 03/08/2017 1152   Hepatic Function Panel     Component Value Date/Time   PROT 6.7 07/08/2017 1214   PROT 6.0 04/05/2017 1450   ALBUMIN 3.8 07/08/2017 1214   ALBUMIN 3.9 04/05/2017 1450   AST 19 10/01/2017   ALT 24 10/01/2017    ALKPHOS 53 10/01/2017   BILITOT 0.8 07/08/2017 1214   BILITOT 0.2 04/05/2017 1450      Component Value Date/Time   TSH 2.410 03/08/2017 1152   TSH 1.083 08/06/2016 0647   TSH 1.871 01/19/2016 2302   TSH 0.206 (L) 03/10/2011 1351   TSH 1.636 06/18/2010 1430   Results for Tyminski, Kenika G "[name]" (MRN [id number]) as of 11/27/2017 15:41  Ref. Range 03/08/2017 11:52  Vitamin D, 25-Hydroxy Latest Ref Range: 30.0 - 100.0 ng/mL 52.8   ASSESSMENT  AND PLAN: Chronic diastolic (congestive) heart failure (HCC) - Plan: Comprehensive metabolic panel, Lipid Panel With LDL/HDL Ratio  Type 2 diabetes mellitus without complication, without long-term current use of insulin (HCC) - Plan: Insulin, random, Lipid Panel With LDL/HDL Ratio  Other specified hypothyroidism - Plan: T4, free, T3, TSH  Vitamin D deficiency - Plan: VITAMIN D 25 Hydroxy (Vit-D Deficiency, Fractures)  Other hyperlipidemia - Plan: Lipid Panel With LDL/HDL Ratio  Class 2 severe obesity with serious comorbidity and body mass index (BMI) of 39.0 to 39.9 in adult, unspecified obesity type (HCC)  PLAN:  Chronic diastolic congestion Sole agrees to increase lasix to 80 mg in the morning and 40 mg in the afternoon and if any dyspnea, she is to go to the emergency room. Lynell agrees to limit her water intake to 2 liters per day. Nadra agrees to follow up as directed.  Diabetes II non insulin without complications Kinzleigh has been given extensive diabetes education by myself today including ideal fasting and post-prandial blood glucose readings, individual ideal Hgb A1c goals and hypoglycemia prevention. We discussed the importance of good blood sugar control to decrease the likelihood of diabetic complications such as nephropathy, neuropathy, limb loss, blindness, coronary artery disease, and death. We discussed the importance of intensive lifestyle modification including diet, exercise and weight loss as the first line  treatment for diabetes. We will check insulin and Tniya agrees to continue diet and exercise and follow up at the agreed upon time.  Hypothyroidism Toni [name] was informed of the importance of good thyroid control to help with weight loss efforts. She was also informed that supertheraputic thyroid levels are dangerous and will not improve weight loss results. We will check thyroid panel and Toni [name] agreed to follow up as directed.  Vitamin D Deficiency Toni [name] was informed that low vitamin D levels contributes to fatigue and are associated with obesity, breast, and colon cancer. She agrees to continue to take multi vitamin and we will check vitamin D level. She will follow up for routine testing of vitamin D, at least 2-3 times per year. She was informed of the risk of over-replacement of vitamin D and agrees to not increase her dose unless she discusses this with [name] first.  Hyperlipidemia Toni [name] was informed of the American Heart Association Guidelines emphasizing intensive lifestyle modifications as the first line treatment for hyperlipidemia. We discussed many lifestyle modifications today in depth, and Toni [name] will continue to work on decreasing saturated fats such as fatty red meat, butter and many fried foods. She will also increase vegetables and lean protein in her diet and continue to work on exercise and weight loss efforts. We will check lipid panel and Will will follow up as directed.  Obesity Toni [name] is currently in the action stage of change. As such, her goal is to continue with weight loss efforts She has agreed to follow the Category 2 plan Toni [name] has been instructed to work up to a goal of 150 minutes of combined cardio and strengthening exercise per week for weight loss and overall health benefits. We discussed the following Behavioral Modification Strategies today: increasing lean protein intake and decreasing sodium intake  Toni [name] has agreed to follow  up with our clinic in 2 weeks. She was informed of the importance of frequent follow up visits to maximize her success with intensive lifestyle modifications for her multiple health conditions.    OBESITY BEHAVIORAL INTERVENTION VISIT  Today's visit was # 14 out of 22.  Starting weight: 257 lbs Starting  date: 03/08/17 Today's weight : 224 lbs Today's date: 11/27/2017 Total lbs lost to date: 33 (Patients must lose 7 lbs in the first 6 months to continue with counseling)   ASK: We discussed the diagnosis of obesity with Toni [name] today and Toni [name] agreed to give [name] permission to discuss obesity behavioral modification therapy today.  ASSESS: Toni [name] has the diagnosis of obesity and her BMI today is 39.69 Natania is in the action stage of change   ADVISE: Toni [name] was educated on the multiple health risks of obesity as well as the benefit of weight loss to improve her health. She was advised of the need for long term treatment and the importance of lifestyle modifications.  AGREE: Multiple dietary modification options and treatment options were discussed and  Toni [name] agreed to the above obesity treatment plan.   Corey Skains, am acting as transcriptionist for [employer], PA-C I, Lacy Duverney [facility], have reviewed this note and agree with its content

## 2017-11-28 ENCOUNTER — Ambulatory Visit: Payer: Medicare Other | Admitting: Neurology

## 2017-11-28 LAB — COMPREHENSIVE METABOLIC PANEL
ALBUMIN: 4.1 g/dL (ref 3.5–4.8)
ALT: 26 IU/L (ref 0–32)
AST: 13 IU/L (ref 0–40)
Albumin/Globulin Ratio: 2.6 — ABNORMAL HIGH (ref 1.2–2.2)
Alkaline Phosphatase: 55 IU/L (ref 39–117)
BUN / CREAT RATIO: 32 — AB (ref 12–28)
BUN: 23 mg/dL (ref 8–27)
Bilirubin Total: 0.3 mg/dL (ref 0.0–1.2)
CALCIUM: 9 mg/dL (ref 8.7–10.3)
CO2: 33 mmol/L — ABNORMAL HIGH (ref 20–29)
Chloride: 94 mmol/L — ABNORMAL LOW (ref 96–106)
Creatinine, Ser: 0.72 mg/dL (ref 0.57–1.00)
GFR calc Af Amer: 98 mL/min/{1.73_m2} (ref >59)
GFR, EST NON AFRICAN AMERICAN: 85 mL/min/{1.73_m2} (ref >59)
GLOBULIN, TOTAL: 1.6 g/dL (ref 1.5–4.5)
Glucose: 132 mg/dL — ABNORMAL HIGH (ref 65–99)
POTASSIUM: 3.2 mmol/L — AB (ref 3.5–5.2)
SODIUM: 143 mmol/L (ref 134–144)
Total Protein: 5.7 g/dL — ABNORMAL LOW (ref 6.0–8.5)

## 2017-11-28 LAB — LIPID PANEL WITH LDL/HDL RATIO
CHOLESTEROL TOTAL: 129 mg/dL (ref 100–199)
HDL: 51 mg/dL (ref >39)
LDL CALC: 66 mg/dL (ref 0–99)
LDL/HDL RATIO: 1.3 ratio (ref 0.0–3.2)
TRIGLYCERIDES: 60 mg/dL (ref 0–149)
VLDL CHOLESTEROL CAL: 12 mg/dL (ref 5–40)

## 2017-11-28 LAB — INSULIN, RANDOM: INSULIN: 20.4 u[IU]/mL (ref 2.6–24.9)

## 2017-11-28 LAB — TSH: TSH: 0.651 u[IU]/mL (ref 0.450–4.500)

## 2017-11-28 LAB — T3: T3, Total: 67 ng/dL — ABNORMAL LOW (ref 71–180)

## 2017-11-28 LAB — VITAMIN D 25 HYDROXY (VIT D DEFICIENCY, FRACTURES): Vit D, 25-Hydroxy: 59.1 ng/mL (ref 30.0–100.0)

## 2017-11-28 LAB — T4, FREE: Free T4: 1.71 ng/dL (ref 0.82–1.77)

## 2017-11-29 ENCOUNTER — Ambulatory Visit (INDEPENDENT_AMBULATORY_CARE_PROVIDER_SITE_OTHER): Payer: Self-pay | Admitting: Neurology

## 2017-11-29 ENCOUNTER — Telehealth: Payer: Self-pay | Admitting: *Deleted

## 2017-11-29 VITALS — BP 102/65 | HR 80 | Ht 63.0 in

## 2017-11-29 DIAGNOSIS — M62838 Other muscle spasm: Secondary | ICD-10-CM

## 2017-11-29 NOTE — Progress Notes (Signed)
Toni [name] comes into the office today indicating that over the last couple weeks she has had increased problems with muscle spasms and muscle tension in the abdominal area.  The patient believes that she is having an MS exacerbation, she has gone on ACTHAR for this.  She comes in for a baclofen pump readjustment.  The patient had the baclofen pump interrogated.  The patient has had a simple continuous rate of 681.0 mcg/day.  The pump was reprogrammed to 799.5 micrograms per day.  The next refill date will be the January 27, 2018.

## 2017-11-29 NOTE — Telephone Encounter (Signed)
Called,  LVM for pt to call to r/s her next baclofen pump refill. She was originally scheduled for 02/05/18. However, her settings were adjusted today and now her alarm date moved to 01/27/18. She will need to come in before this date.  If she calls, can offer 01/23/18 at 12pm, check in 1130am

## 2017-11-30 ENCOUNTER — Encounter: Payer: Self-pay | Admitting: Neurology

## 2017-11-30 ENCOUNTER — Ambulatory Visit (INDEPENDENT_AMBULATORY_CARE_PROVIDER_SITE_OTHER): Payer: Medicare Other | Admitting: Neurology

## 2017-11-30 ENCOUNTER — Telehealth: Payer: Self-pay | Admitting: Neurology

## 2017-11-30 VITALS — BP 131/77 | HR 76

## 2017-11-30 DIAGNOSIS — G35 Multiple sclerosis: Secondary | ICD-10-CM

## 2017-11-30 DIAGNOSIS — M62838 Other muscle spasm: Secondary | ICD-10-CM | POA: Diagnosis not present

## 2017-11-30 NOTE — Telephone Encounter (Signed)
I called the patient.  I left a message.  If the patient believes that her legs are becoming too floppy on the higher dose of the baclofen pump, she is to come to our office today and I will readjust the pump.

## 2017-11-30 NOTE — Telephone Encounter (Signed)
Pt returned RN's call and advised to come in at noon today via skype per Dr Diamantina Providence.

## 2017-11-30 NOTE — Progress Notes (Signed)
Please refer to the baclofen pump procedure note.

## 2017-11-30 NOTE — Procedures (Signed)
     Toni [name] is a 72 year old patient with a history of progressive multiple sclerosis who reports increased symptoms recently with abdominal spasms and leg spasticity.  The patient has come in for reprogramming of the baclofen pump.  The baclofen pump is set at a simple continuous rate of 681 mcg/day.  The pump was erroneously reset to around 800 mcg/day, the patient comes in for re-program.  The pump was reprogrammed today at a rate of 700.3 mcg/day.  The next refill date is on 05 February 2018.  Overall, this represents approximately a 4% increase in rate from prior settings.

## 2017-12-01 DIAGNOSIS — I509 Heart failure, unspecified: Secondary | ICD-10-CM | POA: Diagnosis not present

## 2017-12-01 DIAGNOSIS — M48062 Spinal stenosis, lumbar region with neurogenic claudication: Secondary | ICD-10-CM | POA: Diagnosis not present

## 2017-12-01 DIAGNOSIS — I110 Hypertensive heart disease with heart failure: Secondary | ICD-10-CM | POA: Diagnosis not present

## 2017-12-01 DIAGNOSIS — I480 Paroxysmal atrial fibrillation: Secondary | ICD-10-CM | POA: Diagnosis not present

## 2017-12-01 DIAGNOSIS — G35 Multiple sclerosis: Secondary | ICD-10-CM | POA: Diagnosis not present

## 2017-12-01 DIAGNOSIS — E119 Type 2 diabetes mellitus without complications: Secondary | ICD-10-CM | POA: Diagnosis not present

## 2017-12-03 ENCOUNTER — Ambulatory Visit: Payer: Self-pay | Admitting: Neurology

## 2017-12-03 NOTE — Telephone Encounter (Signed)
Spoke w/ Dr. [name]. Pt came back for to have her settings adjusted again on 11/30/17. Pt next alarm date 02/05/18. She is scheduled to come in then for next refill.

## 2017-12-04 DIAGNOSIS — G35 Multiple sclerosis: Secondary | ICD-10-CM | POA: Diagnosis not present

## 2017-12-04 DIAGNOSIS — I509 Heart failure, unspecified: Secondary | ICD-10-CM | POA: Diagnosis not present

## 2017-12-04 DIAGNOSIS — M48062 Spinal stenosis, lumbar region with neurogenic claudication: Secondary | ICD-10-CM | POA: Diagnosis not present

## 2017-12-04 DIAGNOSIS — I480 Paroxysmal atrial fibrillation: Secondary | ICD-10-CM | POA: Diagnosis not present

## 2017-12-04 DIAGNOSIS — I110 Hypertensive heart disease with heart failure: Secondary | ICD-10-CM | POA: Diagnosis not present

## 2017-12-04 DIAGNOSIS — E119 Type 2 diabetes mellitus without complications: Secondary | ICD-10-CM | POA: Diagnosis not present

## 2017-12-06 DIAGNOSIS — G35 Multiple sclerosis: Secondary | ICD-10-CM | POA: Diagnosis not present

## 2017-12-06 DIAGNOSIS — I509 Heart failure, unspecified: Secondary | ICD-10-CM | POA: Diagnosis not present

## 2017-12-06 DIAGNOSIS — M48062 Spinal stenosis, lumbar region with neurogenic claudication: Secondary | ICD-10-CM | POA: Diagnosis not present

## 2017-12-06 DIAGNOSIS — I110 Hypertensive heart disease with heart failure: Secondary | ICD-10-CM | POA: Diagnosis not present

## 2017-12-06 DIAGNOSIS — I480 Paroxysmal atrial fibrillation: Secondary | ICD-10-CM | POA: Diagnosis not present

## 2017-12-06 DIAGNOSIS — E119 Type 2 diabetes mellitus without complications: Secondary | ICD-10-CM | POA: Diagnosis not present

## 2017-12-11 ENCOUNTER — Ambulatory Visit (INDEPENDENT_AMBULATORY_CARE_PROVIDER_SITE_OTHER): Payer: Medicare Other | Admitting: Family Medicine

## 2017-12-11 ENCOUNTER — Telehealth (INDEPENDENT_AMBULATORY_CARE_PROVIDER_SITE_OTHER): Payer: Self-pay | Admitting: Family Medicine

## 2017-12-11 ENCOUNTER — Encounter (INDEPENDENT_AMBULATORY_CARE_PROVIDER_SITE_OTHER): Payer: Self-pay

## 2017-12-11 DIAGNOSIS — I110 Hypertensive heart disease with heart failure: Secondary | ICD-10-CM | POA: Diagnosis not present

## 2017-12-11 DIAGNOSIS — G35 Multiple sclerosis: Secondary | ICD-10-CM | POA: Diagnosis not present

## 2017-12-11 DIAGNOSIS — E119 Type 2 diabetes mellitus without complications: Secondary | ICD-10-CM | POA: Diagnosis not present

## 2017-12-11 DIAGNOSIS — I480 Paroxysmal atrial fibrillation: Secondary | ICD-10-CM | POA: Diagnosis not present

## 2017-12-11 DIAGNOSIS — I509 Heart failure, unspecified: Secondary | ICD-10-CM | POA: Diagnosis not present

## 2017-12-11 DIAGNOSIS — M48062 Spinal stenosis, lumbar region with neurogenic claudication: Secondary | ICD-10-CM | POA: Diagnosis not present

## 2017-12-11 NOTE — Telephone Encounter (Signed)
Mr Crowl called to cancel Toni [name]'s appointment today due to a MS flare up.  He stated that Dr. Leafy Ro was going to go over her labs at this appt.  [name] wants to know if Dr. Leafy Ro can call her to go over the labs.  Please advise. Thank you. [name]

## 2017-12-11 NOTE — Telephone Encounter (Signed)
Called pt back. Verified with her that her next alarm date in 02/05/18. I moved her appt from 02/05/18 to 02/04/18 at 12pm so it would not be same day as her alarm date. She verbalized understanding and appreciation for call.

## 2017-12-11 NOTE — Telephone Encounter (Signed)
Called pt per Dr. Leafy Ro informed pt none of her labs were critical and Dr. Leafy Ro would discuss them with her at her next appt. Pt verbalized understanding.

## 2017-12-11 NOTE — Telephone Encounter (Signed)
Pt called she is wanting to have clarification on the alarm date and is 6/18 OV date. Please call [phone]

## 2017-12-18 ENCOUNTER — Ambulatory Visit (INDEPENDENT_AMBULATORY_CARE_PROVIDER_SITE_OTHER): Payer: Medicare Other | Admitting: Pharmacist Clinician (PhC)/ Clinical Pharmacy Specialist

## 2017-12-18 DIAGNOSIS — I482 Chronic atrial fibrillation: Secondary | ICD-10-CM | POA: Diagnosis not present

## 2017-12-18 DIAGNOSIS — Z7901 Long term (current) use of anticoagulants: Secondary | ICD-10-CM | POA: Diagnosis not present

## 2017-12-18 DIAGNOSIS — I4821 Permanent atrial fibrillation: Secondary | ICD-10-CM

## 2017-12-18 LAB — POCT INR: INR: 3.4

## 2017-12-19 ENCOUNTER — Ambulatory Visit (INDEPENDENT_AMBULATORY_CARE_PROVIDER_SITE_OTHER): Payer: Medicare Other | Admitting: Neurology

## 2017-12-19 ENCOUNTER — Encounter: Payer: Self-pay | Admitting: Neurology

## 2017-12-19 ENCOUNTER — Ambulatory Visit (INDEPENDENT_AMBULATORY_CARE_PROVIDER_SITE_OTHER): Payer: Medicare Other | Admitting: Family Medicine

## 2017-12-19 VITALS — BP 92/62 | HR 63 | Ht 63.0 in | Wt 218.0 lb

## 2017-12-19 VITALS — BP 134/78 | HR 113 | Temp 98.5°F | Ht 63.0 in

## 2017-12-19 DIAGNOSIS — E876 Hypokalemia: Secondary | ICD-10-CM

## 2017-12-19 DIAGNOSIS — I83893 Varicose veins of bilateral lower extremities with other complications: Secondary | ICD-10-CM

## 2017-12-19 DIAGNOSIS — Z6839 Body mass index (BMI) 39.0-39.9, adult: Secondary | ICD-10-CM | POA: Diagnosis not present

## 2017-12-19 DIAGNOSIS — Z7189 Other specified counseling: Secondary | ICD-10-CM

## 2017-12-19 DIAGNOSIS — I4891 Unspecified atrial fibrillation: Secondary | ICD-10-CM

## 2017-12-19 DIAGNOSIS — G4737 Central sleep apnea in conditions classified elsewhere: Secondary | ICD-10-CM | POA: Diagnosis not present

## 2017-12-19 DIAGNOSIS — G35D Multiple sclerosis, unspecified: Secondary | ICD-10-CM

## 2017-12-19 DIAGNOSIS — G35 Multiple sclerosis: Secondary | ICD-10-CM | POA: Diagnosis not present

## 2017-12-19 MED ORDER — POTASSIUM CHLORIDE CRYS ER 20 MEQ PO TBCR: 40.0000 meq | EXTENDED_RELEASE_TABLET | Freq: Every day | ORAL | 0 refills | Status: DC

## 2017-12-19 NOTE — Patient Instructions (Signed)
Hypokalemia Hypokalemia means that the amount of potassium in the blood is lower than normal.Potassium is a chemical that helps regulate the amount of fluid in the body (electrolyte). It also stimulates muscle tightening (contraction) and helps nerves work properly.Normally, most of the body's potassium is inside of cells, and only a very small amount is in the blood. Because the amount in the blood is so small, minor changes to potassium levels in the blood can be life-threatening. What are the causes? This condition may be caused by:  Antibiotic medicine.  Diarrhea or vomiting. Taking too much of a medicine that helps you have a bowel movement (laxative) can cause diarrhea and lead to hypokalemia.  Chronic kidney disease (CKD).  Medicines that help the body get rid of excess fluid (diuretics).  Eating disorders, such as bulimia.  Low magnesium levels in the body.  Sweating a lot.  What are the signs or symptoms? Symptoms of this condition include:  Weakness.  Constipation.  Fatigue.  Muscle cramps.  Mental confusion.  Skipped heartbeats or irregular heartbeat (palpitations).  Tingling or numbness.  How is this diagnosed? This condition is diagnosed with a blood test. How is this treated? Hypokalemia can be treated by taking potassium supplements by mouth or adjusting the medicines that you take. Treatment may also include eating more foods that contain a lot of potassium. If your potassium level is very low, you may need to get potassium through an IV tube in one of your veins and be monitored in the hospital. Follow these instructions at home:  Take over-the-counter and prescription medicines only as told by your health care provider. This includes vitamins and supplements.  Eat a healthy diet. A healthy diet includes fresh fruits and vegetables, whole grains, healthy fats, and lean proteins.  If instructed, eat more foods that contain a lot of potassium, such  as: ? Nuts, such as peanuts and pistachios. ? Seeds, such as sunflower seeds and pumpkin seeds. ? Peas, lentils, and lima beans. ? Whole grain and bran cereals and breads. ? Fresh fruits and vegetables, such as apricots, avocado, bananas, cantaloupe, kiwi, oranges, tomatoes, asparagus, and potatoes. ? Orange juice. ? Tomato juice. ? Red meats. ? Yogurt.  Keep all follow-up visits as told by your health care provider. This is important. Contact a health care provider if:  You have weakness that gets worse.  You feel your heart pounding or racing.  You vomit.  You have diarrhea.  You have diabetes (diabetes mellitus) and you have trouble keeping your blood sugar (glucose) in your target range. Get help right away if:  You have chest pain.  You have shortness of breath.  You have vomiting or diarrhea that lasts for more than 2 days.  You faint. This information is not intended to replace advice given to you by your health care provider. Make sure you discuss any questions you have with your health care provider. Document Released: 08/07/2005 Document Revised: 03/25/2016 Document Reviewed: 03/25/2016 Elsevier Interactive Patient Education  2018 [employer]  

## 2017-12-19 NOTE — Progress Notes (Signed)
PATIENT: Toni [name] DOB: 17-Feb-1946  REASON FOR VISIT: follow up MS, OSA, Spasms, / HISTORY FROM: patient  HISTORY OF PRESENT ILLNESS:  Today 12/19/17, Toni [name]  has been seen in this office for MS for 30 years, was diagnosed by Dr. Rosiland Oz in [phone]. Toni [name] had several days of severe spasms, feeling tight, and she was unable to transfer, to walk with her walker. At baseline she can walk 40-50 feet. She was asked to used acthar 2 days in a row, and it seems to help, she has 6 more to go. She did not feel any improvement after taking baclofen.  She has been successful in losing a lot of weight under Dr. Migdalia Dk medical weight management program, at that clinic she needs to stay and stand still on a special scale which allows the differentiation between total weight, water weight fluid and fat content.  She is not able right now to stand still enough to allow this machine to work.  However her weight loss has been so significant that she has some loose skin now up to 18 her current weight is 218 pounds and this will certainly support her apnea treatment.  Orders the patient is here followed also for complex and central sleep apnea, the control had been important as she also developed atrial fibrillation followed by Dr. Debara Pickett.   12-19-2017, RV . Follow up visit with Toni [name], better known as Toni [name], established 72 year old Caucasian female patient with long-standing history of MS.  She just celebrated her 29th wedding anniversary with her husband.  The patient has been followed for a prescription pump and fill by Dr. [name] she has an appointment on April 11 and at that time her baclofen rate was increased to 799.5 mcg/day, for a day if she felt floppy and weak.  The very next day the dose was reduced to 700.3 mcg a day and since then she has been doing okay.  She still uses actor, she still follows with Dr. Leafy Ro at the medical weight management clinic.  Dr. Debara Pickett is her  cardiologist, Dr. Dagmar Hait her primary care physician. The patient most recently saw Dr. Leafy Ro she was unable to step up to the scale and told safely on.  Dr. Leafy Ro obtain a metabolic panel which showed hypokalemia we will review this in detail later on today's visit, she has been started on potassium supplements.    Date , MM: Toni [name] is a 72 year old female with a history of multiple sclerosis and obstructive sleep apnea on BiPAP.   she returns today for follow-up.  Her BiPAP indicates that she use her machine 30 out of 30 days for compliance of 100%.  She use her machine greater than 4 hours 28 out of 30 days for compliance of 93%.  On average she uses her machine 7 hours and 14 minutes.  Her IPAP is 20 cm of water and EPAP pressure of 15 cm of water her residual AHI is 16.7.  She does not have a significant leak.  The patient reports that she has been seeing Dr. Leafy Ro for weight loss.  Reports that she is lost approximately 30 pounds.  She has an appointment this coming Wednesday.  The patient denies any new numbness or weakness does feel like her hands is getting gradually weaker.  She states that she does participate in physical therapy 1 time a week but then does her own exercises 3 times a week.  She uses  a wheelchair primarily.  She reports that she has several more months of Betaseron left but once her prescription runs out she plans to stop this medication.  She reports that taking Acthar works better for her.  She reports that she has been on Copaxone and Avonex but never any of the oral medications.  She returns today for an evaluation.  HISTORY :My long-standing patient Toni [name] has struggled trying to control her complex sleep apneas , which are central and obstructive in nature. She has started on a BiPAP setting of 20/15 cm water and has been 100% compliance for the last 30 days with an average of 7 hours and 42 minutes. The residual AHI is still 13.9 area [facility] home care  did call her and told her that there with 3 days consecutively during which her AHI was markedly reduced. This occurred during May, and it related to [name] being given at double dose of diuretics , while she was at [facility] rehab. It was most nights after a high diuretic was given that she slept the best and happily with congestive central apnea. In the meantime, her cardiologist, Dr. Debara Pickett, has increased Lasix from 60-80 mg daily but she has not had the same effect as she had at 100 mg daily. She is continuously in a fib now. No ablation as long as apnea is not treated.   Dr. Leafy Ro did an extensive lab panel,  Good HB a1c. She was low on protein- and she sees a nutritionist. [name] is now cooking for her. Lots of chicken and vegetables- and she enjoys the food.   MS stable on Acthar. No diabetes on acthar and no relapses into acute a fib. Melatonin helps sleep. Given that Toni [name] has suffered from [place] for almost 30 years now.   REVIEW OF SYSTEMS: Out of a complete 14 system review of symptoms, the patient complains only of the following symptoms, and all other reviewed systems are negative. Ankle and lag edema, abdomina and back spasms. Urinary urge.  Central apneas, neurodegenerative origin. 6/ 15 points on depression scale.   Epworth 8 on Acthar and Baclofen .   ALLERGIES: Allergies  Allergen Reactions  . Hydrocodone Nausea And Vomiting    Hydrocodone causes vomiting  . Oxycodone Nausea And Vomiting    Oral oxycodone causes vomiting  . Chlorhexidine Gluconate Rash    Sores at site  . Zoloft [Sertraline] Hives, Swelling and Rash    HOME MEDICATIONS: Outpatient Medications Prior to Visit  Medication Sig Dispense Refill  . acetaminophen (TYLENOL) 500 MG tablet Take 500-1,000 mg by mouth every 6 (six) hours as needed for fever or headache (or pain).    Marland Kitchen ALPRAZolam (XANAX) 0.5 MG tablet Take 1 tablet (0.5 mg total) by mouth 2 (two) times daily as needed for anxiety. 20 tablet 0  .  baclofen (LIORESAL) 20 MG tablet Take 1 tablet (20 mg total) by mouth 3 (three) times daily as needed for muscle spasms. (Patient taking differently: Take 20 mg by mouth See admin instructions. Once to twice a day for breakthrough spasms) 180 each 3  . Biotin 10000 MCG TABS Take 1,000 mcg by mouth daily.    Marland Kitchen buPROPion (WELLBUTRIN XL) 300 MG 24 hr tablet Take 1 tablet (300 mg total) by mouth daily. 30 tablet 0  . CALCIUM CITRATE PO Take 2 tablets by mouth 2 (two) times daily.    . cetirizine (ZYRTEC) 10 MG tablet Take 10 mg by mouth daily.    Marland Kitchen  Cholecalciferol (VITAMIN D) 2000 units tablet Take 1 tablet (2,000 Units total) by mouth 2 (two) times daily.  0  . co-enzyme Q-10 30 MG capsule Take 30 mg by mouth daily.    Marland Kitchen diltiazem (CARDIZEM CD) 120 MG 24 hr capsule TAKE 1 CAPSULE BY MOUTH  DAILY 90 capsule 2  . docusate sodium (COLACE) 100 MG capsule Take 1 capsule (100 mg total) by mouth daily. 10 capsule 0  . fluticasone (FLONASE) 50 MCG/ACT nasal spray Place 2 sprays into both nostrils daily as needed for allergies.     . furosemide (LASIX) 40 MG tablet Take 2 tablets (80mg ) by mouth in the morning and 1 tablet (40mg ) in the afternoon    . gabapentin (NEURONTIN) 300 MG capsule TAKE 1 CAPSULE BY MOUTH UP  TO 4 TIMES DAILY 360 capsule 3  . HP ACTHAR 80 UNIT/ML injectable gel INJECT 80 UNITS (1ML) SUBCUTANEOUSLY EVERY OTHER DAY FOR A TOTAL OF 10 DAYS. REPEAT EVERY 30 DAYS. 5 mL 5  . lactulose, encephalopathy, (GENERLAC) 10 GM/15ML SOLN Take 15 mLs (10 g total) by mouth 2 (two) times daily as needed. 946 mL 5  . levothyroxine (SYNTHROID, LEVOTHROID) 75 MCG tablet Take 75 mcg by mouth daily.    . Multiple Vitamins-Minerals (CENTRUM SILVER PO) Take 1 tablet by mouth daily.    . Nebivolol HCl (BYSTOLIC) 20 MG TABS Take 1 tablet (20 mg total) by mouth daily. 30 tablet   . Omega-3 Fatty Acids (FISH OIL) 1000 MG CAPS Take 1,000 mg by mouth daily.    Marland Kitchen omeprazole (PRILOSEC) 20 MG capsule Take 20 mg by mouth  daily.     . potassium chloride SA (K-DUR,KLOR-CON) 20 MEQ tablet Take 2 tablets (40 mEq total) by mouth daily. 30 tablet 0  . psyllium (METAMUCIL SMOOTH TEXTURE) 28 % packet Take 1 packet by mouth every other day.    . ranitidine (ZANTAC) 150 MG tablet Take 150 mg by mouth at bedtime.     . simvastatin (ZOCOR) 20 MG tablet Take 10 mg by mouth at bedtime.     . SYRINGE/NEEDLE, DISP, 1 ML (BD LUER-LOK SYRINGE) 25G X 5/8" 1 ML MISC Use as directed to inject  Acthar 10 each prn  . Tamsulosin HCl (FLOMAX) 0.4 MG CAPS Take 0.4 mg by mouth daily.    Marland Kitchen UNABLE TO FIND Baclofen pump: Continuous    . warfarin (COUMADIN) 5 MG tablet TAKE 1/2 TO 1 TABLET BY  MOUTH DAILY AS DIRECTED BY  COUMADIN CLINIC. 90 tablet 1   Facility-Administered Medications Prior to Visit  Medication Dose Route Frequency Provider Last Rate Last Dose  . baclofen (LIORESAL) intrathecal injection 40 mg/72mL  40 mg Intrathecal Once Kathrynn Ducking, MD        PAST MEDICAL HISTORY: Past Medical History:  Diagnosis Date  . Abnormality of gait 03/01/2015  . Anxiety   . Arthritis    "knees" (01/19/2016)  . Atrial fibrillation with RVR ([place]) 01/19/2016  . Back pain   . Chest pain   . Childhood asthma   . Chronic pain    "nerve pain from the MS"  . Constipation   . Depression   . DVT (deep venous thrombosis) ([place]) 1983   "RLE; may have just been phlebitis; it was before the age of dopplers"  . 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 GERD (gastroesophageal reflux disease)   . History of  stress test 06/21/10   limited exam with some degree of breast attenuatin of the apex however a component of apical ischemia is not excluded, EF 62%; cardiac cath   . HLD (hyperlipidemia)   . Hx of echocardiogram 04/22/10   EF 55-60%, no valve issues  . Hypertension   . Hypothyroidism   . Joint pain   . Migraine    "none since I stopped beta blocker in 1990s" (01/19/2016)  . Multiple  sclerosis ([place])    has had this may 1989-[place] reg doc  . OSA on CPAP   . Osteoarthritis   . PAF (paroxysmal atrial fibrillation) ([place])    on coumadin; documented on monitor 06/2010  . Pneumonia 11/2011  . Shortness of breath   . Thyroid disease     PAST SURGICAL HISTORY: Past Surgical History:  Procedure Laterality Date  . ANTERIOR CERVICAL DECOMP/DISCECTOMY FUSION  01/2004  . BACK SURGERY    . CARDIAC CATHETERIZATION  06/22/2010   normal coronary arteries, PAF  . CATARACT EXTRACTION, BILATERAL Bilateral   . CHILD BIRTHS     X2  . DILATION AND CURETTAGE OF UTERUS    . INFUSION PUMP IMPLANTATION  ~ 2009   baclofen infusion in lower abd  . PAIN PUMP REVISION N/A 07/29/2014   Procedure: Baclofen pump replacement;  Surgeon: Erline Levine, MD;  Location: [facility];  Service: Neurosurgery;  Laterality: N/A;  Baclofen pump replacement  . TONSILLECTOMY AND ADENOIDECTOMY    . VARICOSE VEIN SURGERY  ~ 1968  . VENOUS ABLATION  12/16/2010   radiofreq ablation -Dr Elisabeth Cara and [facility]  . WISDOM TOOTH EXTRACTION      FAMILY HISTORY: Family History  Problem Relation Age of Onset  . Coronary artery disease Father        at age 58  . Hyperlipidemia Father   . Thyroid disease Father   . Coronary artery disease Maternal Grandmother   . Depression Maternal Grandmother   . Cancer Paternal Grandmother   . Depression Mother   . Sudden death Mother   . Anxiety disorder Mother   . Alcoholism Son     SOCIAL HISTORY: Social History   Socioeconomic History  . Marital status: Married    Spouse name: [name]  . Number of children: 2  . Years of education: COLLEGE  . Highest education level: Not on file  Occupational History  . Occupation: RETIRED  Social Needs  . Financial resource strain: Not on file  . Food insecurity:    Worry: Not on file    Inability: Not on file  . Transportation needs:    Medical: Not on file    Non-medical: Not on file  Tobacco Use  . Smoking status: Former  Smoker    Packs/day: 0.50    Years: 7.00    Pack years: 3.50    Types: Cigarettes    Last attempt to quit: 08/21/1976    Years since quitting: 41.3  . Smokeless tobacco: Never Used  Substance and Sexual Activity  . Alcohol use: Yes    Alcohol/week: 0.6 oz    Types: 1 Glasses of wine per week  . Drug use: No  . Sexual activity: Not Currently  Lifestyle  . Physical activity:    Days per week: Not on file    Minutes per session: Not on file  . Stress: Not on file  Relationships  . Social connections:    Talks on phone: Not on file    Gets together: Not on  file    Attends religious service: Not on file    Active member of club or organization: Not on file    Attends meetings of clubs or organizations: Not on file    Relationship status: Not on file  . Intimate partner violence:    Fear of current or ex partner: Not on file    Emotionally abused: Not on file    Physically abused: Not on file    Forced sexual activity: Not on file  Other Topics Concern  . Not on file  Social History Narrative   Lives in [place] with Tillman Sers [phone], [phone]   Currently in rehab      PHYSICAL EXAM  Vitals:   12/19/17 1528  BP: 92/62  Pulse: 63  Weight: 218 lb (98.9 kg)  Height: 5\' 3"  (1.6 m)   Body mass index is 38.62 kg/m.  Generalized: Well developed, in no acute distress, wheelchair    irregular heart beat, a fibrillation.  Wheelchair bound. .  Excess skin after weight loss, chin and upper arms.  . bilateral edema up to the knees. Broken blood vessels.  Lungs are clear to auscultation.   Neurological examination :  Mentation: Alert oriented to time, place, history taking. Follows all commands speech and language fluent Cranial nerve : Pupils were equal round reactive to light. Extraocular movements were full, visual field were full on confrontational test. Facial sensation and strength were normal. Tongue in midline.  Head turning and shoulder shrug  were normal and  symmetric. Motor: The patient cannot fully extend her right upper extremity at the elbow, but she has good biceps flexion strength.   Grip strength is weak and bilaterally there is atrophy of the thenar eminence noted.   Her fingers feel stiff, she noted an abdominal and chest spasm, she is unable right now to walk has lower back pain and feels generalized weak and heavy.  After has given her some energy back.  As to sensory she noticed ongoing numbness in the fingers of both hands the pounds but no numbness in the legs.  Loss of sensation at the feet and ankle which may be related to the significant edema.  We did not ambulate   DIAGNOSTIC DATA (LABS, IMAGING, TESTING) - I reviewed patient records, labs, notes, testing and imaging myself where available.  Lab Results  Component Value Date   WBC 7.0 10/01/2017   HGB 15.1 10/01/2017   HCT 47 (A) 10/01/2017   MCV 93.2 07/08/2017   PLT 185 10/01/2017      Component Value Date/Time   NA 143 11/27/2017 1011   K 3.2 (L) 11/27/2017 1011   CL 94 (L) 11/27/2017 1011   CO2 33 (H) 11/27/2017 1011   GLUCOSE 132 (H) 11/27/2017 1011   GLUCOSE 92 07/08/2017 1214   BUN 23 11/27/2017 1011   CREATININE 0.72 11/27/2017 1011   CALCIUM 9.0 11/27/2017 1011   PROT 5.7 (L) 11/27/2017 1011   ALBUMIN 4.1 11/27/2017 1011   AST 13 11/27/2017 1011   ALT 26 11/27/2017 1011   ALKPHOS 55 11/27/2017 1011   BILITOT 0.3 11/27/2017 1011   GFRNONAA 85 11/27/2017 1011   GFRAA 98 11/27/2017 1011   Lab Results  Component Value Date   CHOL 129 11/27/2017   HDL 51 11/27/2017   LDLCALC 66 11/27/2017   TRIG 60 11/27/2017   CHOLHDL 3.0 04/22/2010   Lab Results  Component Value Date   HGBA1C 5.2 10/01/2017   No results found for:  [id number] Lab Results  Component Value Date   TSH 0.651 11/27/2017      ASSESSMENT AND PLAN 72 y.o. year old female  has a past medical history of Abnormality of gait (03/01/2015), Anxiety, Arthritis, Atrial fibrillation with  RVR ([place]) (01/19/2016), Back pain, Chest pain, Childhood asthma, Chronic pain, Constipation, Depression, DVT (deep venous thrombosis) ([place]) (1983), Edema, GERD (gastroesophageal reflux disease), History of stress test (06/21/10), HLD (hyperlipidemia), echocardiogram (04/22/10), Hypertension, Hypothyroidism, Joint pain, Migraine, Multiple sclerosis ([place]), OSA on CPAP, Osteoarthritis, PAF (paroxysmal atrial fibrillation) ([place]), Pneumonia (11/2011), Shortness of breath, and Thyroid disease. here with:  1.  COMPLEX sleep apnea on BiPAP- has enough central apneas to cause her atrial fib, but we were unable to control her apnea and she is therefor not a cardioversion candidate.  download of PAP-Mrs. Baisley has been extremely compliant has used at 100% of the last 90 days was 97% compliance by time, there was only one night where she used less than 4 hours her average she was at x6 hours and 57 minutes, her ear groove the ultra machine has a inspiratory pressure setting of 20 expiratory pressure of 15 her AHI is 6.7 which is significantly reduced over her baseline, 4.4 of these apneas are central in nature she has very few air leaks.  Based on these settings and data I would not need to adjust the machine in any way.   However the residual AHI of 6.7 may still not be sufficient for Dr. Debara Pickett to consider cardioversion or neither an ablation. 2.  Multiple sclerosis on Acthar, with gait problems, sensory abnormalities and " MS hugs" .Her legs are tight. May be she can use a higher dose of baclofen/ continues on neurontin. She is off of betaseron and she feels she should get back on it.  3. Morbid obesity,  Dr Leafy Ro follows.  4. Atrial fibrillation,chronic and persistent.    She will continue on BiPAP therapy 20 over 15 cm . The patient wants to  continue on Betaseron- and  she will remain on Acthar.  Dr [name] will try an increase of baclofen application by pump. As described above , she is now on 700 mcg a day.         She is advised that if her symptoms worsen or she develops new symptoms she should let us know.  She will follow-up in July/ august  months with me,  Dr. Brett Fairy. and in 6 month with NP.   Visit was 25 minutes long and addressed 4 problems.    [name], MD  12/19/2017, 3:50 PM [facility] [address], [place], [place] [address] [phone]

## 2017-12-20 DIAGNOSIS — M48062 Spinal stenosis, lumbar region with neurogenic claudication: Secondary | ICD-10-CM | POA: Diagnosis not present

## 2017-12-20 DIAGNOSIS — E119 Type 2 diabetes mellitus without complications: Secondary | ICD-10-CM | POA: Diagnosis not present

## 2017-12-20 DIAGNOSIS — I480 Paroxysmal atrial fibrillation: Secondary | ICD-10-CM | POA: Diagnosis not present

## 2017-12-20 DIAGNOSIS — I110 Hypertensive heart disease with heart failure: Secondary | ICD-10-CM | POA: Diagnosis not present

## 2017-12-20 DIAGNOSIS — I509 Heart failure, unspecified: Secondary | ICD-10-CM | POA: Diagnosis not present

## 2017-12-20 DIAGNOSIS — G35 Multiple sclerosis: Secondary | ICD-10-CM | POA: Diagnosis not present

## 2017-12-20 NOTE — Progress Notes (Signed)
Office: [phone]  /  Fax: [phone]   HPI:   Chief Complaint: OBESITY Toni [name] is here to discuss her progress with her obesity treatment plan. She is on the Category 2 plan and is following her eating plan approximately 100 % of the time. She states she is doing physical therapy for 30 minutes 4 times per week. Toni [name] is unable to weigh today as her multiple sclerosis has flared up and she is unable to stand on the scale. She has done well with following her diet and has done well overall. She is getting good support from her husband.  Her weight is unknown lbs today and unable to determine weight loss since her last visit. She has lost unkown lbs since starting treatment with [name].  Hypokalemia Toni [name] is on Lasix but not on potassium and her levels have decreased, now at 3.2. She notes fatigue and multiple sclerosis has flared up.  ALLERGIES: Allergies  Allergen Reactions  . Hydrocodone Nausea And Vomiting    Hydrocodone causes vomiting  . Oxycodone Nausea And Vomiting    Oral oxycodone causes vomiting  . Chlorhexidine Gluconate Rash    Sores at site  . Zoloft [Sertraline] Hives, Swelling and Rash    MEDICATIONS: Current Outpatient Medications on File Prior to Visit  Medication Sig Dispense Refill  . acetaminophen (TYLENOL) 500 MG tablet Take 500-1,000 mg by mouth every 6 (six) hours as needed for fever or headache (or pain).    Marland Kitchen ALPRAZolam (XANAX) 0.5 MG tablet Take 1 tablet (0.5 mg total) by mouth 2 (two) times daily as needed for anxiety. 20 tablet 0  . baclofen (LIORESAL) 20 MG tablet Take 1 tablet (20 mg total) by mouth 3 (three) times daily as needed for muscle spasms. (Patient taking differently: Take 20 mg by mouth See admin instructions. Once to twice a day for breakthrough spasms) 180 each 3  . Biotin 10000 MCG TABS Take 1,000 mcg by mouth daily.    Marland Kitchen buPROPion (WELLBUTRIN XL) 300 MG 24 hr tablet Take 1 tablet (300 mg total) by mouth daily. 30 tablet 0  .  CALCIUM CITRATE PO Take 2 tablets by mouth 2 (two) times daily.    . cetirizine (ZYRTEC) 10 MG tablet Take 10 mg by mouth daily.    . Cholecalciferol (VITAMIN D) 2000 units tablet Take 1 tablet (2,000 Units total) by mouth 2 (two) times daily.  0  . co-enzyme Q-10 30 MG capsule Take 30 mg by mouth daily.    Marland Kitchen diltiazem (CARDIZEM CD) 120 MG 24 hr capsule TAKE 1 CAPSULE BY MOUTH  DAILY 90 capsule 2  . docusate sodium (COLACE) 100 MG capsule Take 1 capsule (100 mg total) by mouth daily. 10 capsule 0  . fluticasone (FLONASE) 50 MCG/ACT nasal spray Place 2 sprays into both nostrils daily as needed for allergies.     . furosemide (LASIX) 40 MG tablet Take 2 tablets (80mg ) by mouth in the morning and 1 tablet (40mg ) in the afternoon    . gabapentin (NEURONTIN) 300 MG capsule TAKE 1 CAPSULE BY MOUTH UP  TO 4 TIMES DAILY 360 capsule 3  . HP ACTHAR 80 UNIT/ML injectable gel INJECT 80 UNITS (1ML) SUBCUTANEOUSLY EVERY OTHER DAY FOR A TOTAL OF 10 DAYS. REPEAT EVERY 30 DAYS. 5 mL 5  . lactulose, encephalopathy, (GENERLAC) 10 GM/15ML SOLN Take 15 mLs (10 g total) by mouth 2 (two) times daily as needed. 946 mL 5  . levothyroxine (SYNTHROID, LEVOTHROID) 75 MCG tablet Take 75  mcg by mouth daily.    . Multiple Vitamins-Minerals (CENTRUM SILVER PO) Take 1 tablet by mouth daily.    . Nebivolol HCl (BYSTOLIC) 20 MG TABS Take 1 tablet (20 mg total) by mouth daily. 30 tablet   . Omega-3 Fatty Acids (FISH OIL) 1000 MG CAPS Take 1,000 mg by mouth daily.    Marland Kitchen omeprazole (PRILOSEC) 20 MG capsule Take 20 mg by mouth daily.     . psyllium (METAMUCIL SMOOTH TEXTURE) 28 % packet Take 1 packet by mouth every other day.    . ranitidine (ZANTAC) 150 MG tablet Take 150 mg by mouth at bedtime.     . simvastatin (ZOCOR) 20 MG tablet Take 10 mg by mouth at bedtime.     . SYRINGE/NEEDLE, DISP, 1 ML (BD LUER-LOK SYRINGE) 25G X 5/8" 1 ML MISC Use as directed to inject  Acthar 10 each prn  . Tamsulosin HCl (FLOMAX) 0.4 MG CAPS Take 0.4 mg  by mouth daily.    Marland Kitchen UNABLE TO FIND Baclofen pump: Continuous    . warfarin (COUMADIN) 5 MG tablet TAKE 1/2 TO 1 TABLET BY  MOUTH DAILY AS DIRECTED BY  COUMADIN CLINIC. 90 tablet 1   Current Facility-Administered Medications on File Prior to Visit  Medication Dose Route Frequency Provider Last Rate Last Dose  . baclofen (LIORESAL) intrathecal injection 40 mg/70mL  40 mg Intrathecal Once Kathrynn Ducking, MD        PAST MEDICAL HISTORY: Past Medical History:  Diagnosis Date  . Abnormality of gait 03/01/2015  . Anxiety   . Arthritis    "knees" (01/19/2016)  . Atrial fibrillation with RVR ([place]) 01/19/2016  . Back pain   . Chest pain   . Childhood asthma   . Chronic pain    "nerve pain from the MS"  . Constipation   . Depression   . DVT (deep venous thrombosis) ([place]) 1983   "RLE; may have just been phlebitis; it was before the age of dopplers"  . 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 GERD (gastroesophageal reflux disease)   . History of stress test 06/21/10   limited exam with some degree of breast attenuatin of the apex however a component of apical ischemia is not excluded, EF 62%; cardiac cath   . HLD (hyperlipidemia)   . Hx of echocardiogram 04/22/10   EF 55-60%, no valve issues  . Hypertension   . Hypothyroidism   . Joint pain   . Migraine    "none since I stopped beta blocker in 1990s" (01/19/2016)  . Multiple sclerosis ([place])    has had this may 1989-[place] reg doc  . OSA on CPAP   . Osteoarthritis   . PAF (paroxysmal atrial fibrillation) ([place])    on coumadin; documented on monitor 06/2010  . Pneumonia 11/2011  . Shortness of breath   . Thyroid disease     PAST SURGICAL HISTORY: Past Surgical History:  Procedure Laterality Date  . ANTERIOR CERVICAL DECOMP/DISCECTOMY FUSION  01/2004  . BACK SURGERY    . CARDIAC CATHETERIZATION  06/22/2010   normal coronary arteries, PAF  . CATARACT EXTRACTION, BILATERAL Bilateral    . CHILD BIRTHS     X2  . DILATION AND CURETTAGE OF UTERUS    . INFUSION PUMP IMPLANTATION  ~ 2009   baclofen infusion in lower abd  . PAIN PUMP REVISION N/A 07/29/2014   Procedure: Baclofen pump replacement;  Surgeon: Erline Levine, MD;  Location: [facility];  Service: Neurosurgery;  Laterality: N/A;  Baclofen pump replacement  . TONSILLECTOMY AND ADENOIDECTOMY    . VARICOSE VEIN SURGERY  ~ 1968  . VENOUS ABLATION  12/16/2010   radiofreq ablation -Dr Elisabeth Cara and [facility]  . WISDOM TOOTH EXTRACTION      SOCIAL HISTORY: Social History   Tobacco Use  . Smoking status: Former Smoker    Packs/day: 0.50    Years: 7.00    Pack years: 3.50    Types: Cigarettes    Last attempt to quit: 08/21/1976    Years since quitting: 41.3  . Smokeless tobacco: Never Used  Substance Use Topics  . Alcohol use: Yes    Alcohol/week: 0.6 oz    Types: 1 Glasses of wine per week  . Drug use: No    FAMILY HISTORY: Family History  Problem Relation Age of Onset  . Coronary artery disease Father        at age 44  . Hyperlipidemia Father   . Thyroid disease Father   . Coronary artery disease Maternal Grandmother   . Depression Maternal Grandmother   . Cancer Paternal Grandmother   . Depression Mother   . Sudden death Mother   . Anxiety disorder Mother   . Alcoholism Son     ROS: Review of Systems  Constitutional: Positive for malaise/fatigue.    PHYSICAL EXAM: Blood pressure 134/78, pulse (!) 113, temperature 98.5 F (36.9 C), temperature source Oral, height 5\' 3"  (1.6 m), SpO2 97 %. Body mass index is 39.68 kg/m. Physical Exam  Constitutional: She is oriented to person, place, and time. She appears well-developed and well-nourished.  Cardiovascular: Normal rate.  Pulmonary/Chest: Effort normal.  Musculoskeletal: Normal range of motion.  Neurological: She is oriented to person, place, and time.  Skin: Skin is warm and dry.  Psychiatric: She has a normal mood and affect. Her behavior is  normal.  Vitals reviewed.   RECENT LABS AND TESTS: BMET    Component Value Date/Time   NA 143 11/27/2017 1011   K 3.2 (L) 11/27/2017 1011   CL 94 (L) 11/27/2017 1011   CO2 33 (H) 11/27/2017 1011   GLUCOSE 132 (H) 11/27/2017 1011   GLUCOSE 92 07/08/2017 1214   BUN 23 11/27/2017 1011   CREATININE 0.72 11/27/2017 1011   CALCIUM 9.0 11/27/2017 1011   GFRNONAA 85 11/27/2017 1011   GFRAA 98 11/27/2017 1011   Lab Results  Component Value Date   HGBA1C 5.2 10/01/2017   HGBA1C 5.4 03/08/2017   HGBA1C  04/21/2010    5.5 (NOTE)                                                                       According to the ADA Clinical Practice Recommendations for 2011, when HbA1c is used as a screening test:   >=6.5%   Diagnostic of Diabetes Mellitus           (if abnormal result  is confirmed)  5.7-6.4%   Increased risk of developing Diabetes Mellitus  References:Diagnosis and Classification of Diabetes Mellitus,Diabetes XIPJ,8250,53(ZJQBH 1):S62-S69 and Standards of Medical Care in         Diabetes - 2011,Diabetes Care,2011,34  (Suppl 1):S11-S61.  Lab Results  Component Value Date   INSULIN 20.4 11/27/2017   INSULIN 20.4 03/08/2017   CBC    Component Value Date/Time   WBC 7.0 10/01/2017   WBC 8.4 07/08/2017 1214   RBC 4.83 07/08/2017 1214   HGB 15.1 10/01/2017   HGB 14.1 03/08/2017 1152   HCT 47 (A) 10/01/2017   HCT 43.0 03/08/2017 1152   PLT 185 10/01/2017   PLT 219 12/18/2014 1157   MCV 93.2 07/08/2017 1214   MCV 93 03/08/2017 1152   MCH 31.5 07/08/2017 1214   MCHC 33.8 07/08/2017 1214   RDW 13.3 07/08/2017 1214   RDW 13.3 03/08/2017 1152   LYMPHSABS 1.6 07/08/2017 1214   LYMPHSABS 2.2 03/08/2017 1152   MONOABS 1.3 (H) 07/08/2017 1214   EOSABS 0.2 07/08/2017 1214   EOSABS 0.1 03/08/2017 1152   BASOSABS 0.0 07/08/2017 1214   BASOSABS 0.0 03/08/2017 1152   Iron/TIBC/Ferritin/ %Sat No results found for: IRON, TIBC, FERRITIN, IRONPCTSAT Lipid Panel     Component Value  Date/Time   CHOL 129 11/27/2017 1011   TRIG 60 11/27/2017 1011   HDL 51 11/27/2017 1011   CHOLHDL 3.0 04/22/2010 0536   VLDL 29 04/22/2010 0536   LDLCALC 66 11/27/2017 1011   Hepatic Function Panel     Component Value Date/Time   PROT 5.7 (L) 11/27/2017 1011   ALBUMIN 4.1 11/27/2017 1011   AST 13 11/27/2017 1011   ALT 26 11/27/2017 1011   ALKPHOS 55 11/27/2017 1011   BILITOT 0.3 11/27/2017 1011      Component Value Date/Time   TSH 0.651 11/27/2017 1011   TSH 2.410 03/08/2017 1152   TSH 1.083 08/06/2016 0647   TSH 1.871 01/19/2016 2302   TSH 0.206 (L) 03/10/2011 1351    ASSESSMENT AND PLAN: Hypokalemia - Plan: potassium chloride SA (K-DUR,KLOR-CON) 20 MEQ tablet  Class 2 severe obesity with serious comorbidity and body mass index (BMI) of 39.0 to 39.9 in adult, unspecified obesity type (HCC)  PLAN:  Hypokalemia Toni [name] agrees to start KCI 40 meq q daily #30 with no refills. We will recheck K+ in 3 weeks and Toni [name] agrees to follow up with our clinic in 3 weeks.  We spent > than 50% of the 30 minute visit on the counseling as documented in the note.  Obesity Toni [name] is currently in the action stage of change. As such, her goal is to continue with weight loss efforts She has agreed to follow the Category 2 plan Toni [name] has been instructed to work up to a goal of 150 minutes of combined cardio and strengthening exercise per week for weight loss and overall health benefits. We discussed the following Behavioral Modification Strategies today: increasing lean protein intake, decreasing simple carbohydrates  and work on meal planning and easy cooking plans   Toni [name] has agreed to follow up with our clinic in 3 weeks. She was informed of the importance of frequent follow up visits to maximize her success with intensive lifestyle modifications for her multiple health conditions.   OBESITY BEHAVIORAL INTERVENTION VISIT  Today's visit was # 15 out of 22.  Starting  weight: 257 lbs Starting date: 03/08/17 Today's weight : unknown  Today's date: 12/19/2017 Total lbs lost to date: unknown (Patients must lose 7 lbs in the first 6 months to continue with counseling)   ASK: We discussed the diagnosis of obesity with Toni [name] today and Toni [name] agreed to give [name] permission to discuss obesity behavioral modification therapy today.  ASSESS: Toni [name]  has the diagnosis of obesity and her BMI today is unknown Toni [name] is in the action stage of change   ADVISE: Toni [name] was educated on the multiple health risks of obesity as well as the benefit of weight loss to improve her health. She was advised of the need for long term treatment and the importance of lifestyle modifications.  AGREE: Multiple dietary modification options and treatment options were discussed and  Toni [name] agreed to the above obesity treatment plan.  I, Trixie Dredge, am acting as transcriptionist for Dennard Nip, MD  I have reviewed the above documentation for accuracy and completeness, and I agree with the above. -Dennard Nip, MD

## 2017-12-26 ENCOUNTER — Telehealth (INDEPENDENT_AMBULATORY_CARE_PROVIDER_SITE_OTHER): Payer: Self-pay | Admitting: Family Medicine

## 2017-12-26 NOTE — Telephone Encounter (Signed)
Patient called, she has a question regarding her Potassium.  She stated that Dr. Leafy Ro told her to take one a day, but the when she picked it up, the bottle says 2 a day and she only has 30 pills.  She wants to know which it should be.  [phone]

## 2017-12-27 DIAGNOSIS — G35 Multiple sclerosis: Secondary | ICD-10-CM | POA: Diagnosis not present

## 2017-12-27 DIAGNOSIS — I110 Hypertensive heart disease with heart failure: Secondary | ICD-10-CM | POA: Diagnosis not present

## 2017-12-27 DIAGNOSIS — M48062 Spinal stenosis, lumbar region with neurogenic claudication: Secondary | ICD-10-CM | POA: Diagnosis not present

## 2017-12-27 DIAGNOSIS — E119 Type 2 diabetes mellitus without complications: Secondary | ICD-10-CM | POA: Diagnosis not present

## 2017-12-27 DIAGNOSIS — I480 Paroxysmal atrial fibrillation: Secondary | ICD-10-CM | POA: Diagnosis not present

## 2017-12-27 DIAGNOSIS — I509 Heart failure, unspecified: Secondary | ICD-10-CM | POA: Diagnosis not present

## 2017-12-27 NOTE — Telephone Encounter (Signed)
Spoke with the patient after reviewing her chart and explained to her that Dr Leafy Ro wants her to take 40 meq of Potassium daily. We will send in another prescription when she runs out to ensure she has the correct amount.   April, [employer]

## 2018-01-01 ENCOUNTER — Ambulatory Visit (INDEPENDENT_AMBULATORY_CARE_PROVIDER_SITE_OTHER): Payer: Medicare Other | Admitting: Pharmacist

## 2018-01-01 DIAGNOSIS — I482 Chronic atrial fibrillation: Secondary | ICD-10-CM | POA: Diagnosis not present

## 2018-01-01 DIAGNOSIS — I4821 Permanent atrial fibrillation: Secondary | ICD-10-CM

## 2018-01-01 DIAGNOSIS — Z7901 Long term (current) use of anticoagulants: Secondary | ICD-10-CM

## 2018-01-01 LAB — POCT INR: INR: 1.5

## 2018-01-03 DIAGNOSIS — G35 Multiple sclerosis: Secondary | ICD-10-CM | POA: Diagnosis not present

## 2018-01-03 DIAGNOSIS — E119 Type 2 diabetes mellitus without complications: Secondary | ICD-10-CM | POA: Diagnosis not present

## 2018-01-03 DIAGNOSIS — I110 Hypertensive heart disease with heart failure: Secondary | ICD-10-CM | POA: Diagnosis not present

## 2018-01-03 DIAGNOSIS — M48062 Spinal stenosis, lumbar region with neurogenic claudication: Secondary | ICD-10-CM | POA: Diagnosis not present

## 2018-01-03 DIAGNOSIS — I509 Heart failure, unspecified: Secondary | ICD-10-CM | POA: Diagnosis not present

## 2018-01-03 DIAGNOSIS — I480 Paroxysmal atrial fibrillation: Secondary | ICD-10-CM | POA: Diagnosis not present

## 2018-01-05 ENCOUNTER — Other Ambulatory Visit (INDEPENDENT_AMBULATORY_CARE_PROVIDER_SITE_OTHER): Payer: Self-pay | Admitting: Family Medicine

## 2018-01-05 DIAGNOSIS — E876 Hypokalemia: Secondary | ICD-10-CM

## 2018-01-07 ENCOUNTER — Ambulatory Visit (INDEPENDENT_AMBULATORY_CARE_PROVIDER_SITE_OTHER): Payer: Medicare Other | Admitting: Family Medicine

## 2018-01-07 VITALS — BP 124/84 | HR 91 | Ht 63.0 in | Wt 216.0 lb

## 2018-01-07 DIAGNOSIS — Z6838 Body mass index (BMI) 38.0-38.9, adult: Secondary | ICD-10-CM | POA: Diagnosis not present

## 2018-01-07 DIAGNOSIS — E876 Hypokalemia: Secondary | ICD-10-CM

## 2018-01-07 MED ORDER — POTASSIUM CHLORIDE CRYS ER 20 MEQ PO TBCR: 20.0000 meq | EXTENDED_RELEASE_TABLET | Freq: Every day | ORAL | 0 refills | Status: DC

## 2018-01-07 NOTE — Progress Notes (Signed)
Office: [phone]  /  Fax: [phone]   HPI:   Chief Complaint: OBESITY Toni [name] is here to discuss her progress with her obesity treatment plan. She is on the Category 2 plan and is following her eating plan approximately 90 % of the time. She states she is doing physical therapy for 15-20 minutes 3-5 times per week. Elany continues to do very well with weight loss, doing well with diet prescription but notes increase hunger late night especially when she stays up late. Sometimes missing breakfast when she sleeps in.  Her weight is 216 lb (98 kg) today and has had a weight loss of 8 pounds over a period of 2 to 3 weeks weeks since her last visit. She has lost 41 lbs since starting treatment with [name].  Hypokalemia Toni [name] is on KCI now, due to have labs rechecked. She denies nausea, vomiting, or muscle weakness.  ALLERGIES: Allergies  Allergen Reactions  . Hydrocodone Nausea And Vomiting    Hydrocodone causes vomiting  . Oxycodone Nausea And Vomiting    Oral oxycodone causes vomiting  . Chlorhexidine Gluconate Rash    Sores at site  . Zoloft [Sertraline] Hives, Swelling and Rash    MEDICATIONS: Current Outpatient Medications on File Prior to Visit  Medication Sig Dispense Refill  . acetaminophen (TYLENOL) 500 MG tablet Take 500-1,000 mg by mouth every 6 (six) hours as needed for fever or headache (or pain).    Marland Kitchen ALPRAZolam (XANAX) 0.5 MG tablet Take 1 tablet (0.5 mg total) by mouth 2 (two) times daily as needed for anxiety. 20 tablet 0  . baclofen (LIORESAL) 20 MG tablet Take 1 tablet (20 mg total) by mouth 3 (three) times daily as needed for muscle spasms. (Patient taking differently: Take 20 mg by mouth See admin instructions. Once to twice a day for breakthrough spasms) 180 each 3  . Biotin 10000 MCG TABS Take 1,000 mcg by mouth daily.    Marland Kitchen buPROPion (WELLBUTRIN XL) 300 MG 24 hr tablet Take 1 tablet (300 mg total) by mouth daily. 30 tablet 0  . CALCIUM CITRATE PO  Take 2 tablets by mouth 2 (two) times daily.    . cetirizine (ZYRTEC) 10 MG tablet Take 10 mg by mouth daily.    . Cholecalciferol (VITAMIN D) 2000 units tablet Take 1 tablet (2,000 Units total) by mouth 2 (two) times daily.  0  . co-enzyme Q-10 30 MG capsule Take 30 mg by mouth daily.    Marland Kitchen diltiazem (CARDIZEM CD) 120 MG 24 hr capsule TAKE 1 CAPSULE BY MOUTH  DAILY 90 capsule 2  . docusate sodium (COLACE) 100 MG capsule Take 1 capsule (100 mg total) by mouth daily. 10 capsule 0  . fluticasone (FLONASE) 50 MCG/ACT nasal spray Place 2 sprays into both nostrils daily as needed for allergies.     . furosemide (LASIX) 40 MG tablet Take 2 tablets (80mg ) by mouth in the morning and 1 tablet (40mg ) in the afternoon    . gabapentin (NEURONTIN) 300 MG capsule TAKE 1 CAPSULE BY MOUTH UP  TO 4 TIMES DAILY 360 capsule 3  . HP ACTHAR 80 UNIT/ML injectable gel INJECT 80 UNITS (1ML) SUBCUTANEOUSLY EVERY OTHER DAY FOR A TOTAL OF 10 DAYS. REPEAT EVERY 30 DAYS. 5 mL 5  . lactulose, encephalopathy, (GENERLAC) 10 GM/15ML SOLN Take 15 mLs (10 g total) by mouth 2 (two) times daily as needed. 946 mL 5  . levothyroxine (SYNTHROID, LEVOTHROID) 75 MCG tablet Take 75 mcg by mouth daily.    Marland Kitchen  Multiple Vitamins-Minerals (CENTRUM SILVER PO) Take 1 tablet by mouth daily.    . Nebivolol HCl (BYSTOLIC) 20 MG TABS Take 1 tablet (20 mg total) by mouth daily. 30 tablet   . Omega-3 Fatty Acids (FISH OIL) 1000 MG CAPS Take 1,000 mg by mouth daily.    Marland Kitchen omeprazole (PRILOSEC) 20 MG capsule Take 20 mg by mouth daily.     . psyllium (METAMUCIL SMOOTH TEXTURE) 28 % packet Take 1 packet by mouth every other day.    . ranitidine (ZANTAC) 150 MG tablet Take 150 mg by mouth at bedtime.     . simvastatin (ZOCOR) 20 MG tablet Take 10 mg by mouth at bedtime.     . SYRINGE/NEEDLE, DISP, 1 ML (BD LUER-LOK SYRINGE) 25G X 5/8" 1 ML MISC Use as directed to inject  Acthar 10 each prn  . Tamsulosin HCl (FLOMAX) 0.4 MG CAPS Take 0.4 mg by mouth daily.      Marland Kitchen UNABLE TO FIND Baclofen pump: Continuous    . warfarin (COUMADIN) 5 MG tablet TAKE 1/2 TO 1 TABLET BY  MOUTH DAILY AS DIRECTED BY  COUMADIN CLINIC. 90 tablet 1   Current Facility-Administered Medications on File Prior to Visit  Medication Dose Route Frequency Provider Last Rate Last Dose  . baclofen (LIORESAL) intrathecal injection 40 mg/17mL  40 mg Intrathecal Once Kathrynn Ducking, MD        PAST MEDICAL HISTORY: Past Medical History:  Diagnosis Date  . Abnormality of gait 03/01/2015  . Anxiety   . Arthritis    "knees" (01/19/2016)  . Atrial fibrillation with RVR ([place]) 01/19/2016  . Back pain   . Chest pain   . Childhood asthma   . Chronic pain    "nerve pain from the MS"  . Constipation   . Depression   . DVT (deep venous thrombosis) ([place]) 1983   "RLE; may have just been phlebitis; it was before the age of dopplers"  . 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 GERD (gastroesophageal reflux disease)   . History of stress test 06/21/10   limited exam with some degree of breast attenuatin of the apex however a component of apical ischemia is not excluded, EF 62%; cardiac cath   . HLD (hyperlipidemia)   . Hx of echocardiogram 04/22/10   EF 55-60%, no valve issues  . Hypertension   . Hypothyroidism   . Joint pain   . Migraine    "none since I stopped beta blocker in 1990s" (01/19/2016)  . Multiple sclerosis ([place])    has had this may 1989-[place] reg doc  . OSA on CPAP   . Osteoarthritis   . PAF (paroxysmal atrial fibrillation) ([place])    on coumadin; documented on monitor 06/2010  . Pneumonia 11/2011  . Shortness of breath   . Thyroid disease     PAST SURGICAL HISTORY: Past Surgical History:  Procedure Laterality Date  . ANTERIOR CERVICAL DECOMP/DISCECTOMY FUSION  01/2004  . BACK SURGERY    . CARDIAC CATHETERIZATION  06/22/2010   normal coronary arteries, PAF  . CATARACT EXTRACTION, BILATERAL Bilateral   . CHILD BIRTHS      X2  . DILATION AND CURETTAGE OF UTERUS    . INFUSION PUMP IMPLANTATION  ~ 2009   baclofen infusion in lower abd  . PAIN PUMP REVISION N/A 07/29/2014   Procedure: Baclofen pump replacement;  Surgeon: Erline Levine, MD;  Location:  [place];  Service: Neurosurgery;  Laterality: N/A;  Baclofen pump replacement  . TONSILLECTOMY AND ADENOIDECTOMY    . VARICOSE VEIN SURGERY  ~ 1968  . VENOUS ABLATION  12/16/2010   radiofreq ablation -Dr Elisabeth Cara and [facility]  . WISDOM TOOTH EXTRACTION      SOCIAL HISTORY: Social History   Tobacco Use  . Smoking status: Former Smoker    Packs/day: 0.50    Years: 7.00    Pack years: 3.50    Types: Cigarettes    Last attempt to quit: 08/21/1976    Years since quitting: 41.4  . Smokeless tobacco: Never Used  Substance Use Topics  . Alcohol use: Yes    Alcohol/week: 0.6 oz    Types: 1 Glasses of wine per week  . Drug use: No    FAMILY HISTORY: Family History  Problem Relation Age of Onset  . Coronary artery disease Father        at age 18  . Hyperlipidemia Father   . Thyroid disease Father   . Coronary artery disease Maternal Grandmother   . Depression Maternal Grandmother   . Cancer Paternal Grandmother   . Depression Mother   . Sudden death Mother   . Anxiety disorder Mother   . Alcoholism Son     ROS: Review of Systems  Constitutional: Positive for weight loss.  Gastrointestinal: Negative for nausea and vomiting.  Musculoskeletal:       Negative muscle weakness    PHYSICAL EXAM: Blood pressure 124/84, pulse 91, height 5\' 3"  (1.6 m), weight 216 lb (98 kg), SpO2 96 %. Body mass index is 38.26 kg/m. Physical Exam  Constitutional: She is oriented to person, place, and time. She appears well-developed and well-nourished.  Cardiovascular: Normal rate.  Pulmonary/Chest: Effort normal.  Musculoskeletal: Normal range of motion.  Neurological: She is oriented to person, place, and time.  Skin: Skin is warm and dry.  Psychiatric: She  has a normal mood and affect. Her behavior is normal.  Vitals reviewed.   RECENT LABS AND TESTS: BMET    Component Value Date/Time   NA 143 11/27/2017 1011   K 3.2 (L) 11/27/2017 1011   CL 94 (L) 11/27/2017 1011   CO2 33 (H) 11/27/2017 1011   GLUCOSE 132 (H) 11/27/2017 1011   GLUCOSE 92 07/08/2017 1214   BUN 23 11/27/2017 1011   CREATININE 0.72 11/27/2017 1011   CALCIUM 9.0 11/27/2017 1011   GFRNONAA 85 11/27/2017 1011   GFRAA 98 11/27/2017 1011   Lab Results  Component Value Date   HGBA1C 5.2 10/01/2017   HGBA1C 5.4 03/08/2017   HGBA1C  04/21/2010    5.5 (NOTE)                                                                       According to the ADA Clinical Practice Recommendations for 2011, when HbA1c is used as a screening test:   >=6.5%   Diagnostic of Diabetes Mellitus           (if abnormal result  is confirmed)  5.7-6.4%   Increased risk of developing Diabetes Mellitus  References:Diagnosis and Classification of Diabetes Mellitus,Diabetes YIRS,8546,27(OJJKK 1):S62-S69 and Standards of Medical Care in  Diabetes - 2011,Diabetes Care,2011,34  (Suppl 1):S11-S61.   Lab Results  Component Value Date   INSULIN 20.4 11/27/2017   INSULIN 20.4 03/08/2017   CBC    Component Value Date/Time   WBC 7.0 10/01/2017   WBC 8.4 07/08/2017 1214   RBC 4.83 07/08/2017 1214   HGB 15.1 10/01/2017   HGB 14.1 03/08/2017 1152   HCT 47 (A) 10/01/2017   HCT 43.0 03/08/2017 1152   PLT 185 10/01/2017   PLT 219 12/18/2014 1157   MCV 93.2 07/08/2017 1214   MCV 93 03/08/2017 1152   MCH 31.5 07/08/2017 1214   MCHC 33.8 07/08/2017 1214   RDW 13.3 07/08/2017 1214   RDW 13.3 03/08/2017 1152   LYMPHSABS 1.6 07/08/2017 1214   LYMPHSABS 2.2 03/08/2017 1152   MONOABS 1.3 (H) 07/08/2017 1214   EOSABS 0.2 07/08/2017 1214   EOSABS 0.1 03/08/2017 1152   BASOSABS 0.0 07/08/2017 1214   BASOSABS 0.0 03/08/2017 1152   Iron/TIBC/Ferritin/ %Sat No results found for: IRON, TIBC, FERRITIN,  IRONPCTSAT Lipid Panel     Component Value Date/Time   CHOL 129 11/27/2017 1011   TRIG 60 11/27/2017 1011   HDL 51 11/27/2017 1011   CHOLHDL 3.0 04/22/2010 0536   VLDL 29 04/22/2010 0536   LDLCALC 66 11/27/2017 1011   Hepatic Function Panel     Component Value Date/Time   PROT 5.7 (L) 11/27/2017 1011   ALBUMIN 4.1 11/27/2017 1011   AST 13 11/27/2017 1011   ALT 26 11/27/2017 1011   ALKPHOS 55 11/27/2017 1011   BILITOT 0.3 11/27/2017 1011      Component Value Date/Time   TSH 0.651 11/27/2017 1011   TSH 2.410 03/08/2017 1152   TSH 1.083 08/06/2016 0647   TSH 1.871 01/19/2016 2302   TSH 0.206 (L) 03/10/2011 1351    ASSESSMENT AND PLAN: Hypokalemia - Plan: Comprehensive metabolic panel, potassium chloride SA (K-DUR,KLOR-CON) 20 MEQ tablet  Class 2 severe obesity with serious comorbidity and body mass index (BMI) of 38.0 to 38.9 in adult, unspecified obesity type ([place])  PLAN:  Hypokalemia Erleen agrees to increase KCI to 20 meq 2 tablets PO q daily #60 with no refills. We will check CMP and Zori agrees to follow up with our clinic in 3 weeks.  We spent > than 50% of the 30 minute visit on the counseling as documented in the note.  Obesity Britlyn is currently in the action stage of change. As such, her goal is to continue with weight loss efforts She has agreed to follow the Category 2 plan, Ok to change breakfast to her PM snack and make lunch 1st meal of the day when she sleeps late. Lorrane has been instructed to work up to a goal of 150 minutes of combined cardio and strengthening exercise per week for weight loss and overall health benefits. We discussed the following Behavioral Modification Strategies today: increasing lean protein intake, decreasing simple carbohydrates, and no skipping meals    Itzamar has agreed to follow up with our clinic in 3 weeks. She was informed of the importance of frequent follow up visits to maximize her success with  intensive lifestyle modifications for her multiple health conditions.   OBESITY BEHAVIORAL INTERVENTION VISIT  Today's visit was # 16 out of 22.  Starting weight: 257 lbs Starting date: 03/08/17 Today's weight : 216 lbs Today's date: 01/07/2018 Total lbs lost to date: 82 (Patients must lose 7 lbs in the first 6 months to continue with counseling)   ASK:  We discussed the diagnosis of obesity with [name] today and Ople agreed to give [name] permission to discuss obesity behavioral modification therapy today.  ASSESS: Emonee has the diagnosis of obesity and her BMI today is 38.27 Adriahna is in the action stage of change   ADVISE: Manaia was educated on the multiple health risks of obesity as well as the benefit of weight loss to improve her health. She was advised of the need for long term treatment and the importance of lifestyle modifications.  AGREE: Multiple dietary modification options and treatment options were discussed and  Felicity agreed to the above obesity treatment plan.  I, Trixie Dredge, am acting as transcriptionist for Dennard Nip, MD  I have reviewed the above documentation for accuracy and completeness, and I agree with the above. -Dennard Nip, MD

## 2018-01-08 ENCOUNTER — Telehealth: Payer: Self-pay | Admitting: *Deleted

## 2018-01-08 ENCOUNTER — Other Ambulatory Visit: Payer: Self-pay | Admitting: *Deleted

## 2018-01-08 DIAGNOSIS — I110 Hypertensive heart disease with heart failure: Secondary | ICD-10-CM | POA: Diagnosis not present

## 2018-01-08 DIAGNOSIS — M48062 Spinal stenosis, lumbar region with neurogenic claudication: Secondary | ICD-10-CM | POA: Diagnosis not present

## 2018-01-08 DIAGNOSIS — E119 Type 2 diabetes mellitus without complications: Secondary | ICD-10-CM | POA: Diagnosis not present

## 2018-01-08 DIAGNOSIS — I509 Heart failure, unspecified: Secondary | ICD-10-CM | POA: Diagnosis not present

## 2018-01-08 DIAGNOSIS — I872 Venous insufficiency (chronic) (peripheral): Secondary | ICD-10-CM

## 2018-01-08 DIAGNOSIS — G35 Multiple sclerosis: Secondary | ICD-10-CM | POA: Diagnosis not present

## 2018-01-08 DIAGNOSIS — I480 Paroxysmal atrial fibrillation: Secondary | ICD-10-CM | POA: Diagnosis not present

## 2018-01-08 LAB — COMPREHENSIVE METABOLIC PANEL
A/G RATIO: 2.2 (ref 1.2–2.2)
ALK PHOS: 56 IU/L (ref 39–117)
ALT: 21 IU/L (ref 0–32)
AST: 16 IU/L (ref 0–40)
Albumin: 4.2 g/dL (ref 3.5–4.8)
BILIRUBIN TOTAL: 0.3 mg/dL (ref 0.0–1.2)
BUN/Creatinine Ratio: 36 — ABNORMAL HIGH (ref 12–28)
BUN: 23 mg/dL (ref 8–27)
CHLORIDE: 99 mmol/L (ref 96–106)
CO2: 25 mmol/L (ref 20–29)
CREATININE: 0.64 mg/dL (ref 0.57–1.00)
Calcium: 9.4 mg/dL (ref 8.7–10.3)
GFR calc Af Amer: 105 mL/min/{1.73_m2} (ref >59)
GFR calc non Af Amer: 91 mL/min/{1.73_m2} (ref >59)
GLOBULIN, TOTAL: 1.9 g/dL (ref 1.5–4.5)
Glucose: 111 mg/dL — ABNORMAL HIGH (ref 65–99)
POTASSIUM: 4.6 mmol/L (ref 3.5–5.2)
SODIUM: 139 mmol/L (ref 134–144)
Total Protein: 6.1 g/dL (ref 6.0–8.5)

## 2018-01-08 NOTE — Telephone Encounter (Signed)
Pt called in c/o pain in her left lower leg. She is worried that it may be a DVT. Denies swelling of foot and ankle although this is hard to determine as she has chronic swelling. She is on Warfarin. Told her I would do as she asked and in the meantime she should continue stockings, heat, elevation and Ibuprofen if needed. She says she can take that even though she is on a blood thinner. Schedulers will call her. Follow prn,

## 2018-01-09 ENCOUNTER — Telehealth: Payer: Self-pay | Admitting: Vascular Surgery

## 2018-01-09 NOTE — Telephone Encounter (Signed)
sch appt 9am LE Venous 1015am F/U MD

## 2018-01-09 NOTE — Telephone Encounter (Signed)
-----   Message from Rolla Flatten, RN sent at 01/08/2018  3:18 PM EDT ----- Pt needs a r/o DVT of her left leg as soon as possible. She'll see any dr. Please call her once this is scheduled. I'll put an order in. Thursday is a good day for her.

## 2018-01-10 ENCOUNTER — Encounter (HOSPITAL_COMMUNITY): Payer: Medicare Other

## 2018-01-15 ENCOUNTER — Encounter: Payer: Self-pay | Admitting: Internal Medicine

## 2018-01-15 ENCOUNTER — Ambulatory Visit (INDEPENDENT_AMBULATORY_CARE_PROVIDER_SITE_OTHER): Payer: Medicare Other | Admitting: Pharmacist Clinician (PhC)/ Clinical Pharmacy Specialist

## 2018-01-15 ENCOUNTER — Ambulatory Visit (INDEPENDENT_AMBULATORY_CARE_PROVIDER_SITE_OTHER): Payer: Medicare Other | Admitting: Internal Medicine

## 2018-01-15 VITALS — BP 106/66 | HR 97 | Ht 63.0 in | Wt 218.0 lb

## 2018-01-15 DIAGNOSIS — I4821 Permanent atrial fibrillation: Secondary | ICD-10-CM

## 2018-01-15 DIAGNOSIS — I482 Chronic atrial fibrillation: Secondary | ICD-10-CM

## 2018-01-15 DIAGNOSIS — I10 Essential (primary) hypertension: Secondary | ICD-10-CM

## 2018-01-15 DIAGNOSIS — Z7901 Long term (current) use of anticoagulants: Secondary | ICD-10-CM | POA: Diagnosis not present

## 2018-01-15 DIAGNOSIS — I872 Venous insufficiency (chronic) (peripheral): Secondary | ICD-10-CM

## 2018-01-15 LAB — POCT INR: INR: 1.6 — AB (ref 2.0–3.0)

## 2018-01-15 NOTE — Patient Instructions (Signed)
Description   Take 1.5 tablets today Tuesday May 28, then increase dose to 1 tablet daily.  Repeat INR in 2 weeks

## 2018-01-15 NOTE — Patient Instructions (Signed)
Your physician wants you to follow-up in: 6 months with Dr. Hilty. You will receive a reminder letter in the mail two months in advance. If you don't receive a letter, please call our office to schedule the follow-up appointment.    

## 2018-01-15 NOTE — Progress Notes (Signed)
OFFICE NOTE  Chief Complaint:  Continued weight loss, improved edema  Primary Care Physician: Prince Solian, MD  HPI:  Toni [name] is a 72 year old female with multiple sclerosis, paroxysmal atrial fibrillation, on Coumadin, morbid obesity, obstructive sleep apnea, and dyslipidemia. She has had worsening swelling in her legs, thought to be due to an MS flare. She has a history of varicose veins with severe venous insufficiency in both the right and left greater saphenous veins. The right greater saphenous vein was actually successfully ablated by my partner, Dr. Elisabeth Cara, about 1 to 2 years ago. She does have residual reflux in the left greater saphenous vein extending from the proximal calf up to the saphenofemoral junction. The vein measured between 4 and 8 mm with between 1 and 6 seconds of reflux. We had discussed ablation of that before, but she has canceled several times at procedures. At this point, I think there is little benefit to continuing to try for her to undergo ablation. 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 She can wear work showed a compression stockings and this does seem to help her. With regards to her atrial fibrillation she has not had significant recurrence to her knowledge. She continues on warfarin and has been therapeutic, with her INR of 1.9 in the office by fingerstick today.  Toni [name] returns today for followup. She reports her MS is somewhat progressive. She continues to have some enlargement of her legs but no significant swelling. There is a mild varicosities in the right lower extremity. She is unaware of her atrial fibrillation. INR today was 2.3.   I saw Toni [name] back in the office today,  She is describing more shortness of breath and feels that she's been in A. Fib more recently. In fact today6/21her EKG does show persistent atrial fibrillation. In the past she's been paroxysmal , but it may be that she  is more frequently having A. Fib. She has trouble telling whether not she is more short of breath or symptomatic from A. Fib versus her multiple sclerosis. She is also reporting more swelling in her left leg.  Toni [name] returns for follow-up today. She recently saw her neurologist regarding her MS which seems to be flaring. In addition she was noted to have significant apneic events despite using CPAP. There is concern for possible central sleep apnea. Both of these episodes are probably contributing to her recurrent atrial fibrillation. She's been wearing a monitor for a period of time which indicates a burden of A. fib of about 80% of the time. It does not appear that she is in persistent A. fib, rather she has had some episodes of sinus rhythm, but they're much less frequent than her A. Fib.  I saw Toni [name] back today in the office. Recently I saw her as an add-on visit briefly as she was describing some chest pain when she was getting her INR checked. An EKG at that time showed no ischemia. I did not feel that this was cardiac. She continues intermittently have abnormal pains both in her chest as well as her abdomen and extremities. This may be related to MS. She's had difficulty with CPAP and apparently getting a good mask fit. She has been a persistent atrial fibrillation for about 6 months. It's unclear whether or not she symptomatic with this and therefore I have not pursued a rhythm control strategy. Her rate control is generally pretty good and she has  been therapeutic on warfarin. She is reporting worsening swelling of her legs although her weight she says is been stable. She has a history of venous insufficiency and had a right greater saphenous vein ablation by one of my partners. She's concerned about more swelling in her left leg and whether she's developed worsening venous insufficiency. She also reports cold feet with numbness in her extremities. As she is basically wheelchair bound, I cannot  assess whether she is having any claudication.  Toni [name] returns today for follow-up. She underwent venous and arterial lower external Dopplers. Arterial Dopplers were normal which showed no evidence of obstruction. Her venous Doppler showed no DVT. There was significant venous reflux in the left greater saphenous vein. This is an area where she has swelling and discomfort. This may be amenable to treatment. I increased her Lasix and she reports some improvement in her leg swelling. She's trying to elevate her feet and reports she is intolerant of wearing compression stockings.  02/03/2016  Toni [name] was seen today in the office for follow-up of recent hospitalization. She was seeing her neurologist and complained of left chest and left arm pain and was referred to the hospital. She ruled out for MI and was felt that her chest pain was on coronary. She underwent an echocardiogram which showed low-normal EF but was also found to be in A. fib with rapid ventricular response. Her A. fib is persistent at this time but the rate was elevated. She was started on Cardizem which helped improve her rate and was discharged on Cardizem CD 120 mg daily. Heart rate today is better controlled in the 70s. She reports her arm pain is improved and she said today that she "did not feel that it was her heart".  05/09/2016  Toni [name] was seen in the office for follow-up today. She reports improved heart rate control with heart rates between 50 and 80 on diltiazem. Over this will help with her mild cardiomyopathy with EF 50-55% on recent echo. She reports some increased fluid gain particularly in her abdomen. This may be due to some degree of right heart dysfunction. She's working with her neurologist for MS as well as her sleep apnea. There is a component of central sleep apnea and she is reportedly going to have additional sleep study to look for the need for BiPAP therapy. She denies any recurrent chest pain or left arm  pain.  02/14/2017  Toni [name] was seen today in follow-up. She was recently hospitalized. She said problems with increasing fluid gain. I increased her Lasix last time however when she was hospitalized she required high-dose Lasix and says she has she felt better with that. She subsequently been put on BiPAP therapy. She says her sleeping is improved significantly. She is concerned though recently she's had some increased weight gain. She feels like she may need an increasing dose of diuretic. Her last echo was in December 2017 which showed an EF of 55%. This is been stable. If this is some heart failure, may be related diastolic dysfunction however suspect is more likely related to autonomic dysfunction the setting of multiple sclerosis.  03/29/2017  Toni [name] returns today for follow-up. She continues to report lower extremity edema and abdominal swelling. We'll increase her Lasix however she has not noted significant weight loss. It is actually very difficult to estimate her weight because of her MS. We tried to place her on the scale today however she was unable to stand long enough to  get an accurate weight. She reports urination was not as good on 40 mg of Lasix but is currently taking 80 mg once daily and has better urination with that.  05/18/2017  Toni [name] was seen today in follow-up. She reports some improvement in her abdominal swelling and bloating. Check she feels like she might be somewhat dry now. Her weight is down to 246 pounds from 253 pounds. She is also working with the weight loss clinic at [facility]. She is interested in decreasing her diuretic which is reasonable. She asked again about probably trying to get back into sinus rhythm however she's been in persistent A. fib which is long-standing at this point for more than one or 2 years. Her last echo in December 2017 showed mild left atrial enlargement. Is not clear that she's symptomatic with her A. fib although does feel tired at  times. She says she's had some improvement with her sleep apnea therapy.  01/15/2018  Toni [name] returns today for follow-up.  She is continued to lose some weight.  Weight is down now to 218 pounds, significant improvement from her prior weight over 250 pounds.  She also had marked reduction in edema.  Her blood pressure is low normal at the moment and may benefit from reduction in her blood pressure medications.  Her sleep apnea has improved significantly according to Dr. Brett Fairy, especially her obstructive symptoms.  She still has central sleep apnea.  In addition she has secondary progressive MS and recently had worsening symptoms associated with that.  Heart rate was elevated today with regards to A. fib however that did come down after short period of time.  She again appears to be asymptomatic with her A. fib.  The main reason not to pursue sinus rhythm is the fact that she has been persistently in A. fib for well over 5 years, and the likelihood of reestablishing sinus rhythm is exceedingly low.  PMHx:  Past Medical History:  Diagnosis Date  . Abnormality of gait 03/01/2015  . Anxiety   . Arthritis    "knees" (01/19/2016)  . Atrial fibrillation with RVR ([place]) 01/19/2016  . Back pain   . Chest pain   . Childhood asthma   . Chronic pain    "nerve pain from the MS"  . Constipation   . Depression   . DVT (deep venous thrombosis) ([place]) 1983   "RLE; may have just been phlebitis; it was before the age of dopplers"  . 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 GERD (gastroesophageal reflux disease)   . History of stress test 06/21/10   limited exam with some degree of breast attenuatin of the apex however a component of apical ischemia is not excluded, EF 62%; cardiac cath   . HLD (hyperlipidemia)   . Hx of echocardiogram 04/22/10   EF 55-60%, no valve issues  . Hypertension   . Hypothyroidism   . Joint pain   . Migraine    "none since I  stopped beta blocker in 1990s" (01/19/2016)  . Multiple sclerosis ([place])    has had this may 1989-[facility] reg doc  . OSA on CPAP   . Osteoarthritis   . PAF (paroxysmal atrial fibrillation) ([place])    on coumadin; documented on monitor 06/2010  . Pneumonia 11/2011  . Shortness of breath   . Thyroid disease     Past Surgical History:  Procedure Laterality Date  .  ANTERIOR CERVICAL DECOMP/DISCECTOMY FUSION  01/2004  . BACK SURGERY    . CARDIAC CATHETERIZATION  06/22/2010   normal coronary arteries, PAF  . CATARACT EXTRACTION, BILATERAL Bilateral   . CHILD BIRTHS     X2  . DILATION AND CURETTAGE OF UTERUS    . INFUSION PUMP IMPLANTATION  ~ 2009   baclofen infusion in lower abd  . PAIN PUMP REVISION N/A 07/29/2014   Procedure: Baclofen pump replacement;  Surgeon: Erline Levine, MD;  Location: [facility];  Service: Neurosurgery;  Laterality: N/A;  Baclofen pump replacement  . TONSILLECTOMY AND ADENOIDECTOMY    . VARICOSE VEIN SURGERY  ~ 1968  . VENOUS ABLATION  12/16/2010   radiofreq ablation -Dr Elisabeth Cara and [facility]  . WISDOM TOOTH EXTRACTION      FAMHx:  Family History  Problem Relation Age of Onset  . Coronary artery disease Father        at age 22  . Hyperlipidemia Father   . Thyroid disease Father   . Coronary artery disease Maternal Grandmother   . Depression Maternal Grandmother   . Cancer Paternal Grandmother   . Depression Mother   . Sudden death Mother   . Anxiety disorder Mother   . Alcoholism Son     SOCHx:   reports that she quit smoking about 41 years ago. Her smoking use included cigarettes. She has a 3.50 pack-year smoking history. She has never used smokeless tobacco. She reports that she drinks about 0.6 oz of alcohol per week. She reports that she does not use drugs.  ALLERGIES:  Allergies  Allergen Reactions  . Hydrocodone Nausea And Vomiting    Hydrocodone causes vomiting  . Oxycodone Nausea And Vomiting    Oral oxycodone causes vomiting  . Chlorhexidine  Gluconate Rash    Sores at site  . Zoloft [Sertraline] Hives, Swelling and Rash    ROS: Pertinent items noted in HPI and remainder of comprehensive ROS otherwise negative.  HOME MEDS: Current Outpatient Medications  Medication Sig Dispense Refill  . acetaminophen (TYLENOL) 500 MG tablet Take 500-1,000 mg by mouth every 6 (six) hours as needed for fever or headache (or pain).    Marland Kitchen ALPRAZolam (XANAX) 0.5 MG tablet Take 1 tablet (0.5 mg total) by mouth 2 (two) times daily as needed for anxiety. 20 tablet 0  . baclofen (LIORESAL) 20 MG tablet Take 1 tablet (20 mg total) by mouth 3 (three) times daily as needed for muscle spasms. (Patient taking differently: Take 20 mg by mouth See admin instructions. Once to twice a day for breakthrough spasms) 180 each 3  . Biotin 10000 MCG TABS Take 1,000 mcg by mouth daily.    Marland Kitchen buPROPion (WELLBUTRIN XL) 300 MG 24 hr tablet Take 1 tablet (300 mg total) by mouth daily. 30 tablet 0  . CALCIUM CITRATE PO Take 2 tablets by mouth 2 (two) times daily.    . cetirizine (ZYRTEC) 10 MG tablet Take 10 mg by mouth daily.    . Cholecalciferol (VITAMIN D) 2000 units tablet Take 1 tablet (2,000 Units total) by mouth 2 (two) times daily.  0  . co-enzyme Q-10 30 MG capsule Take 30 mg by mouth daily.    Marland Kitchen diltiazem (CARDIZEM CD) 120 MG 24 hr capsule TAKE 1 CAPSULE BY MOUTH  DAILY 90 capsule 2  . docusate sodium (COLACE) 100 MG capsule Take 1 capsule (100 mg total) by mouth daily. 10 capsule 0  . fluticasone (FLONASE) 50 MCG/ACT nasal spray Place 2 sprays  into both nostrils daily as needed for allergies.     . furosemide (LASIX) 40 MG tablet Take 2 tablets (80mg ) by mouth in the morning and 1 tablet (40mg ) in the afternoon    . gabapentin (NEURONTIN) 300 MG capsule TAKE 1 CAPSULE BY MOUTH UP  TO 4 TIMES DAILY 360 capsule 3  . HP ACTHAR 80 UNIT/ML injectable gel INJECT 80 UNITS (1ML) SUBCUTANEOUSLY EVERY OTHER DAY FOR A TOTAL OF 10 DAYS. REPEAT EVERY 30 DAYS. 5 mL 5  .  lactulose, encephalopathy, (GENERLAC) 10 GM/15ML SOLN Take 15 mLs (10 g total) by mouth 2 (two) times daily as needed. 946 mL 5  . levothyroxine (SYNTHROID, LEVOTHROID) 75 MCG tablet Take 75 mcg by mouth daily.    . Multiple Vitamins-Minerals (CENTRUM SILVER PO) Take 1 tablet by mouth daily.    . Nebivolol HCl (BYSTOLIC) 20 MG TABS Take 1 tablet (20 mg total) by mouth daily. 30 tablet   . Omega-3 Fatty Acids (FISH OIL) 1000 MG CAPS Take 1,000 mg by mouth daily.    Marland Kitchen omeprazole (PRILOSEC) 20 MG capsule Take 20 mg by mouth daily.     . potassium chloride SA (K-DUR,KLOR-CON) 20 MEQ tablet Take 1 tablet (20 mEq total) by mouth daily. Take two tabs daily 60 tablet 0  . psyllium (METAMUCIL SMOOTH TEXTURE) 28 % packet Take 1 packet by mouth every other day.    . ranitidine (ZANTAC) 150 MG tablet Take 150 mg by mouth at bedtime.     . simvastatin (ZOCOR) 20 MG tablet Take 10 mg by mouth at bedtime.     . SYRINGE/NEEDLE, DISP, 1 ML (BD LUER-LOK SYRINGE) 25G X 5/8" 1 ML MISC Use as directed to inject  Acthar 10 each prn  . Tamsulosin HCl (FLOMAX) 0.4 MG CAPS Take 0.4 mg by mouth daily.    Marland Kitchen UNABLE TO FIND Baclofen pump: Continuous    . warfarin (COUMADIN) 5 MG tablet TAKE 1/2 TO 1 TABLET BY  MOUTH DAILY AS DIRECTED BY  COUMADIN CLINIC. 90 tablet 1   Current Facility-Administered Medications  Medication Dose Route Frequency Provider Last Rate Last Dose  . baclofen (LIORESAL) intrathecal injection 40 mg/61mL  40 mg Intrathecal Once Kathrynn Ducking, MD        LABS/IMAGING: No results found for this or any previous visit (from the past 48 hour(s)). No results found.  VITALS: BP 106/66   Pulse 97   Ht 5\' 3"  (1.6 m)   Wt 218 lb (98.9 kg) Comment: per pt  SpO2 98%   BMI 38.62 kg/m   EXAM: General appearance: alert, no distress and moderately obese Neck: no carotid bruit, no JVD and thyroid not enlarged, symmetric, no tenderness/mass/nodules Lungs: clear to auscultation bilaterally Heart:  irregularly irregular rhythm Abdomen: soft, non-tender; bowel sounds normal; no masses,  no organomegaly Extremities: edema Trace lower extremity Pulses: 2+ and symmetric Skin: Skin color, texture, turgor normal. No rashes or lesions Neurologic: Mental status: Alert, oriented, thought content appropriate Psych: Pleasant  EKG: A. fib with RVR 114-personally reviewed  ASSESSMENT: 1. Long-standing persistent atrial fibrillation - 80% frequency of A. Fib (not clear if symptomatic) 2. Morbid obesity 3. Obstructive sleep apnea on BIPAP 4. Multiple sclerosis 5. Chronic anticoagulation on warfarin - INR 2.3 today 6. Chronic venous insufficiency - +LGSV reflux 7. Hypertension-controlled 8. Noncardiac chest and left arm pain 9. Non-ischemic cardiomyopathy - EF 50%, improved to 55% (07/2016) 10. Acute on chronic diastolic congestive heart failure  PLAN: 1.  Toni [name] long-standing persistent A. fib now for years and the likelihood of her achieving and maintaining sinus rhythm is exceedingly low.  This is even despite the fact that her sleep apnea is better controlled.  I congratulated her on significant weight loss that she has had with dietary intervention and the help of Dr. Leafy Ro.  Her MS is secondary progressive at this point.  She is anticoagulated on warfarin.  She is struggling with some chronic venous insufficiency and will see Dr. Scot Dock next month.  Overall she is doing very well.  Plan follow-up with me in 6 months or sooner as necessary.  Pixie Casino, MD, [facility], [facility] Director of the [facility]  Card[facility]ovascular [facility] Diplomate of the American Board of Clinical Lipidology Attending Cardiologist  Direct Dial: [phone]  Fax: [phone]  Website:  [name] Alire 01/15/2018, 11:02 AM

## 2018-01-17 ENCOUNTER — Other Ambulatory Visit: Payer: Self-pay | Admitting: Neurology

## 2018-01-17 ENCOUNTER — Ambulatory Visit: Payer: Medicare Other | Admitting: Vascular Surgery

## 2018-01-17 DIAGNOSIS — G609 Hereditary and idiopathic neuropathy, unspecified: Secondary | ICD-10-CM

## 2018-01-17 DIAGNOSIS — N3000 Acute cystitis without hematuria: Secondary | ICD-10-CM | POA: Diagnosis not present

## 2018-01-17 DIAGNOSIS — N281 Cyst of kidney, acquired: Secondary | ICD-10-CM | POA: Diagnosis not present

## 2018-01-18 NOTE — Progress Notes (Signed)
I reviewed the note of the assessment and plan in regards to atrial fib as directed by Dr Debara Pickett  .The patient is known to me .   Latoia Eyster, MD

## 2018-01-23 ENCOUNTER — Ambulatory Visit (INDEPENDENT_AMBULATORY_CARE_PROVIDER_SITE_OTHER): Payer: Medicare Other | Admitting: Vascular Surgery

## 2018-01-23 ENCOUNTER — Other Ambulatory Visit: Payer: Self-pay

## 2018-01-23 ENCOUNTER — Encounter: Payer: Self-pay | Admitting: Vascular Surgery

## 2018-01-23 ENCOUNTER — Ambulatory Visit (HOSPITAL_COMMUNITY)
Admission: RE | Admit: 2018-01-23 | Discharge: 2018-01-23 | Disposition: A | Discharge status: Home / Self Care | Payer: Medicare Other | Source: Ambulatory Visit | Attending: Vascular Surgery | Admitting: Vascular Surgery

## 2018-01-23 VITALS — BP 118/70 | HR 105 | Temp 97.6°F | Resp 20 | Ht 63.0 in | Wt 218.0 lb

## 2018-01-23 DIAGNOSIS — Z7901 Long term (current) use of anticoagulants: Secondary | ICD-10-CM | POA: Insufficient documentation

## 2018-01-23 DIAGNOSIS — I4891 Unspecified atrial fibrillation: Secondary | ICD-10-CM | POA: Diagnosis not present

## 2018-01-23 DIAGNOSIS — I872 Venous insufficiency (chronic) (peripheral): Secondary | ICD-10-CM | POA: Diagnosis present

## 2018-01-23 NOTE — Progress Notes (Signed)
Patient name: Toni [name] MRN: [id number] DOB: [birth year] Sex: female  REASON FOR CONSULT:    Chronic venous disease.  HPI:   Toni [name] is a pleasant 72 y.o. female, who was seen by Dr. Sherren Mocha Early on 11/02/2015.  The patient at that time had bilateral lower extremity swelling.  She has a history of ablation of the right great saphenous vein in 2012.  She has a history of a DVT in the left popliteal vein over 30 years ago.  She is on Coumadin for atrial fibrillation.  At that time the patient had superficial and deep venous reflux in her left lower extremity.  Given that she had significant deep venous reflux she was not felt to be a good candidate for ablation of the left great saphenous vein.  Conservative treatment was recommended and she was to follow-up as needed.  She comes in today complaining of aching pain in both legs but especially on the left side.  The symptoms began gradually several months ago.  Her symptoms are aggravated by standing and sitting.  Standing especially aggravates her aching pain.  She has had no recent history of DVT.  She does elevate her legs and that helps.  Somewhat also gave her a new pneumatic compression garment to try.  She states that her compression stocking on the left makes her symptoms worse.  This is a thigh-high stocking and perhaps this one is too tight.  She denies any chest pain or shortness of breath.  Of note her activity is very limited because of her MS.  She has done water aerobics in the past but has a hard time getting into the pool.  Past Medical History:  Diagnosis Date  . Abnormality of gait 03/01/2015  . Anxiety   . Arthritis    "knees" (01/19/2016)  . Atrial fibrillation with RVR ([place]) 01/19/2016  . Back pain   . Chest pain   . Childhood asthma   . Chronic pain    "nerve pain from the MS"  . Constipation   . Depression   . DVT (deep venous thrombosis) ([place]) 1983   "RLE; may have just been phlebitis; it was before the  age of dopplers"  . 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 GERD (gastroesophageal reflux disease)   . History of stress test 06/21/10   limited exam with some degree of breast attenuatin of the apex however a component of apical ischemia is not excluded, EF 62%; cardiac cath   . HLD (hyperlipidemia)   . Hx of echocardiogram 04/22/10   EF 55-60%, no valve issues  . Hypertension   . Hypothyroidism   . Joint pain   . Migraine    "none since I stopped beta blocker in 1990s" (01/19/2016)  . Multiple sclerosis ([place])    has had this may 1989-[facility] reg doc  . OSA on CPAP   . Osteoarthritis   . PAF (paroxysmal atrial fibrillation) ([place])    on coumadin; documented on monitor 06/2010  . Pneumonia 11/2011  . Shortness of breath   . Thyroid disease     Family History  Problem Relation Age of Onset  . Coronary artery disease Father        at age 79  . Hyperlipidemia Father   . Thyroid disease Father   . Coronary artery disease Maternal Grandmother   . Depression Maternal Grandmother   .  Cancer Paternal Grandmother   . Depression Mother   . Sudden death Mother   . Anxiety disorder Mother   . Alcoholism Son     SOCIAL HISTORY: Social History   Socioeconomic History  . Marital status: Married    Spouse name: [name]  . Number of children: 2  . Years of education: COLLEGE  . Highest education level: Not on file  Occupational History  . Occupation: RETIRED  Social Needs  . Financial resource strain: Not on file  . Food insecurity:    Worry: Not on file    Inability: Not on file  . Transportation needs:    Medical: Not on file    Non-medical: Not on file  Tobacco Use  . Smoking status: Former Smoker    Packs/day: 0.50    Years: 7.00    Pack years: 3.50    Types: Cigarettes    Last attempt to quit: 08/21/1976    Years since quitting: 41.4  . Smokeless tobacco: Never Used  Substance and Sexual Activity  . Alcohol use: Yes     Alcohol/week: 0.6 oz    Types: 1 Glasses of wine per week  . Drug use: No  . Sexual activity: Not Currently  Lifestyle  . Physical activity:    Days per week: Not on file    Minutes per session: Not on file  . Stress: Not on file  Relationships  . Social connections:    Talks on phone: Not on file    Gets together: Not on file    Attends religious service: Not on file    Active member of club or organization: Not on file    Attends meetings of clubs or organizations: Not on file    Relationship status: Not on file  . Intimate partner violence:    Fear of current or ex partner: Not on file    Emotionally abused: Not on file    Physically abused: Not on file    Forced sexual activity: Not on file  Other Topics Concern  . Not on file  Social History Narrative   Lives in [place] with Tillman Sers [phone], [phone]   Currently in rehab    Allergies  Allergen Reactions  . Hydrocodone Nausea And Vomiting    Hydrocodone causes vomiting  . Oxycodone Nausea And Vomiting    Oral oxycodone causes vomiting  . Chlorhexidine Gluconate Rash    Sores at site  . Zoloft [Sertraline] Hives, Swelling and Rash    Current Outpatient Medications  Medication Sig Dispense Refill  . acetaminophen (TYLENOL) 500 MG tablet Take 500-1,000 mg by mouth every 6 (six) hours as needed for fever or headache (or pain).    Marland Kitchen ALPRAZolam (XANAX) 0.5 MG tablet Take 1 tablet (0.5 mg total) by mouth 2 (two) times daily as needed for anxiety. 20 tablet 0  . baclofen (LIORESAL) 20 MG tablet Take 1 tablet (20 mg total) by mouth 3 (three) times daily as needed for muscle spasms. (Patient taking differently: Take 20 mg by mouth See admin instructions. Once to twice a day for breakthrough spasms) 180 each 3  . Biotin 10000 MCG TABS Take 1,000 mcg by mouth daily.    Marland Kitchen buPROPion (WELLBUTRIN XL) 300 MG 24 hr tablet Take 1 tablet (300 mg total) by mouth daily. 30 tablet 0  . CALCIUM CITRATE PO Take 2 tablets by mouth 2  (two) times daily.    . cetirizine (ZYRTEC) 10 MG tablet Take 10 mg by mouth  daily.    . Cholecalciferol (VITAMIN D) 2000 units tablet Take 1 tablet (2,000 Units total) by mouth 2 (two) times daily.  0  . co-enzyme Q-10 30 MG capsule Take 30 mg by mouth daily.    . Cranberry 1000 MG CAPS Take by mouth.    . diltiazem (CARDIZEM CD) 120 MG 24 hr capsule TAKE 1 CAPSULE BY MOUTH  DAILY 90 capsule 2  . docusate sodium (COLACE) 100 MG capsule Take 1 capsule (100 mg total) by mouth daily. 10 capsule 0  . fluticasone (FLONASE) 50 MCG/ACT nasal spray Place 2 sprays into both nostrils daily as needed for allergies.     . furosemide (LASIX) 40 MG tablet Take 2 tablets (80mg ) by mouth in the morning and 1 tablet (40mg ) in the afternoon    . gabapentin (NEURONTIN) 300 MG capsule TAKE 1 CAPSULE BY MOUTH UP  TO 4 TIMES DAILY 360 capsule 3  . HP ACTHAR 80 UNIT/ML injectable gel INJECT 80 UNITS (1ML) SUBCUTANEOUSLY EVERY OTHER DAY FOR A TOTAL OF 10 DAYS. REPEAT EVERY 30 DAYS. 5 mL 5  . lactulose, encephalopathy, (GENERLAC) 10 GM/15ML SOLN Take 15 mLs (10 g total) by mouth 2 (two) times daily as needed. 946 mL 5  . levothyroxine (SYNTHROID, LEVOTHROID) 75 MCG tablet Take 75 mcg by mouth daily.    . Multiple Vitamins-Minerals (CENTRUM SILVER PO) Take 1 tablet by mouth daily.    . Nebivolol HCl (BYSTOLIC) 20 MG TABS Take 1 tablet (20 mg total) by mouth daily. 30 tablet   . Omega-3 Fatty Acids (FISH OIL) 1000 MG CAPS Take 1,000 mg by mouth daily.    Marland Kitchen omeprazole (PRILOSEC) 20 MG capsule Take 20 mg by mouth daily.     . potassium chloride SA (K-DUR,KLOR-CON) 20 MEQ tablet Take 1 tablet (20 mEq total) by mouth daily. Take two tabs daily 60 tablet 0  . psyllium (METAMUCIL SMOOTH TEXTURE) 28 % packet Take 1 packet by mouth every other day.    . ranitidine (ZANTAC) 150 MG tablet Take 150 mg by mouth at bedtime.     . simvastatin (ZOCOR) 20 MG tablet Take 10 mg by mouth at bedtime.     . SYRINGE/NEEDLE, DISP, 1 ML (BD  LUER-LOK SYRINGE) 25G X 5/8" 1 ML MISC Use as directed to inject  Acthar 10 each prn  . Tamsulosin HCl (FLOMAX) 0.4 MG CAPS Take 0.4 mg by mouth daily.    Marland Kitchen UNABLE TO FIND Baclofen pump: Continuous    . warfarin (COUMADIN) 5 MG tablet TAKE 1/2 TO 1 TABLET BY  MOUTH DAILY AS DIRECTED BY  COUMADIN CLINIC. 90 tablet 1   Current Facility-Administered Medications  Medication Dose Route Frequency Provider Last Rate Last Dose  . baclofen (LIORESAL) intrathecal injection 40 mg/84mL  40 mg Intrathecal Once Kathrynn Ducking, MD        REVIEW OF SYSTEMS:  [X]  denotes positive finding, [ ]  denotes negative finding Cardiac  Comments:  Chest pain or chest pressure:    Shortness of breath upon exertion:    Short of breath when lying flat:    Irregular heart rhythm: x       Vascular    Pain in calf, thigh, or hip brought on by ambulation: x   Pain in feet at night that wakes you up from your sleep:     Blood clot in your veins:    Leg swelling:  x       Pulmonary    Oxygen  at home:    Productive cough:     Wheezing:         Neurologic    Sudden weakness in arms or legs:     Sudden numbness in arms or legs:     Sudden onset of difficulty speaking or slurred speech:    Temporary loss of vision in one eye:     Problems with dizziness:         Gastrointestinal    Blood in stool:     Vomited blood:         Genitourinary    Burning when urinating:     Blood in urine:        Psychiatric    Major depression:         Hematologic    Bleeding problems:    Problems with blood clotting too easily:        Skin    Rashes or ulcers:        Constitutional    Fever or chills:     PHYSICAL EXAM:   Vitals:   01/23/18 1004  BP: 118/70  Pulse: (!) 105  Resp: 20  Temp: 97.6 F (36.4 C)  TempSrc: Oral  SpO2: 99%  Weight: 218 lb (98.9 kg)  Height: 5\' 3"  (1.6 m)    GENERAL: The patient is a well-nourished female, in no acute distress. The vital signs are documented above. CARDIAC:  There is a regular rate and rhythm.  VASCULAR: I do not detect carotid bruits. Her feet are warm and well-perfused. She has bilateral lower extremity swelling and some varicose veins in both calves. PULMONARY: There is good air exchange bilaterally without wheezing or rales. ABDOMEN: Soft and non-tender with normal pitched bowel sounds.  MUSCULOSKELETAL: There are no major deformities or cyanosis. NEUROLOGIC: No focal weakness or paresthesias are detected. SKIN: There are no ulcers or rashes noted. PSYCHIATRIC: The patient has a normal affect.  DATA:    LEFT LOWER EXTREMITY VENOUS DUPLEX: I have independently interpreted her left lower extremity venous duplex scan.  There is no evidence of DVT in the left lower extremity.  There is reflux in the left common femoral vein femoral vein.  There is superficial venous reflux in the mid great saphenous vein.  MEDICAL ISSUES:   CHRONIC VENOUS INSUFFICIENCY: This patient has significant chronic venous insufficiency bilaterally.  I do not think she is a candidate for ablation on the left is most of her reflux is in the deep system.  We have discussed the importance of intermittent leg elevation and the proper positioning for this.  I have encouraged her to try her pneumatic compression device which she now has that her friend gave her.  Given her limited activity because of her MS I think water aerobics would be very helpful if she can get back in the pool.  I have encouraged her to avoid prolonged sitting and standing.  She has lost weight which is very helpful for patients with venous disease given that abdominal obesity especially increases venous pressure.  We will be happy to see her back at any time if her symptoms progress.  Deitra Mayo [facility] [phone]

## 2018-01-24 ENCOUNTER — Telehealth: Payer: Self-pay | Admitting: Internal Medicine

## 2018-01-24 DIAGNOSIS — G35 Multiple sclerosis: Secondary | ICD-10-CM | POA: Diagnosis not present

## 2018-01-24 DIAGNOSIS — I509 Heart failure, unspecified: Secondary | ICD-10-CM | POA: Diagnosis not present

## 2018-01-24 DIAGNOSIS — I110 Hypertensive heart disease with heart failure: Secondary | ICD-10-CM | POA: Diagnosis not present

## 2018-01-24 DIAGNOSIS — M48062 Spinal stenosis, lumbar region with neurogenic claudication: Secondary | ICD-10-CM | POA: Diagnosis not present

## 2018-01-24 DIAGNOSIS — I480 Paroxysmal atrial fibrillation: Secondary | ICD-10-CM | POA: Diagnosis not present

## 2018-01-24 DIAGNOSIS — E119 Type 2 diabetes mellitus without complications: Secondary | ICD-10-CM | POA: Diagnosis not present

## 2018-01-24 NOTE — Telephone Encounter (Signed)
Returned call to [employer] states she was out to see the patient today and her resting HR was ranging 74-121.   She states patient informed her she saw Dr. Debara Pickett the other day and he was concerned about her elevated HR.   She states patient reported feeling "fluttering" that she does not normally feel.   Denies other symptoms.  BP 120/80.  Beth wanted to report this do because she states her normal is usually in the 34s and is completely asymptomatic.  Advised would route to MD to review.

## 2018-01-24 NOTE — Telephone Encounter (Signed)
[facility] calling   Is stating that pt came in office this week for elevated heartrate and it is still ranging from 72-122. Please advise.

## 2018-01-25 MED ORDER — DILTIAZEM HCL ER COATED BEADS 180 MG PO CP24: 180.0000 mg | ORAL_CAPSULE | Freq: Every day | ORAL | 3 refills | Status: DC

## 2018-01-25 NOTE — Telephone Encounter (Signed)
Spoke to Mora and patient, aware of changes and verbalized understanding.  rx sent to pharmacy and patient will continue to monitor BP and HR

## 2018-01-25 NOTE — Telephone Encounter (Signed)
Could increase the cardizem to 180 mg LA daily from 120 mg if she wants to suppress the a-fib further.  Dr Lemmie Evens

## 2018-01-28 ENCOUNTER — Ambulatory Visit (INDEPENDENT_AMBULATORY_CARE_PROVIDER_SITE_OTHER): Payer: Medicare Other | Admitting: Pharmacist Clinician (PhC)/ Clinical Pharmacy Specialist

## 2018-01-28 ENCOUNTER — Ambulatory Visit (INDEPENDENT_AMBULATORY_CARE_PROVIDER_SITE_OTHER): Payer: Medicare Other | Admitting: Family Medicine

## 2018-01-28 VITALS — BP 116/77 | HR 76 | Temp 98.1°F | Ht 63.0 in | Wt 215.0 lb

## 2018-01-28 DIAGNOSIS — Z6838 Body mass index (BMI) 38.0-38.9, adult: Secondary | ICD-10-CM | POA: Diagnosis not present

## 2018-01-28 DIAGNOSIS — I4821 Permanent atrial fibrillation: Secondary | ICD-10-CM

## 2018-01-28 DIAGNOSIS — Z7901 Long term (current) use of anticoagulants: Secondary | ICD-10-CM | POA: Diagnosis not present

## 2018-01-28 DIAGNOSIS — I482 Chronic atrial fibrillation: Secondary | ICD-10-CM

## 2018-01-28 DIAGNOSIS — R609 Edema, unspecified: Secondary | ICD-10-CM | POA: Diagnosis not present

## 2018-01-28 LAB — POCT INR: INR: 2.2 (ref 2.0–3.0)

## 2018-01-28 NOTE — Progress Notes (Signed)
Office: [phone]  /  Fax: [phone]   HPI:   Chief Complaint: OBESITY Toni [name] is here to discuss her progress with her obesity treatment plan. She is on the Category 2 plan and is following her eating plan approximately 90 to 95 % of the time. She states she is doing physical therapy for 20 minutes 4 times per week. Toni [name] continues to do well with weight loss. She has had medication changes, including lasix, which leads to significant fluid fluctuations. Her weight is 215 lb (97.5 kg) today and has had a weight loss of 1 pound over a period of 3 weeks since her last visit. She has lost 42 lbs since starting treatment with [name].  Edema Toni [name] is on 80-120 mg lasix daily, but she doesn't take lasix on the days, she goes out of the house. She is on potassium 40 meq due to hypokalemia, which has improved. She is doing well with diet and decreasing sodium.  ALLERGIES: Allergies  Allergen Reactions  . Hydrocodone Nausea And Vomiting    Hydrocodone causes vomiting  . Oxycodone Nausea And Vomiting    Oral oxycodone causes vomiting  . Chlorhexidine Gluconate Rash    Sores at site  . Zoloft [Sertraline] Hives, Swelling and Rash    MEDICATIONS: Current Outpatient Medications on File Prior to Visit  Medication Sig Dispense Refill  . acetaminophen (TYLENOL) 500 MG tablet Take 500-1,000 mg by mouth every 6 (six) hours as needed for fever or headache (or pain).    Marland Kitchen ALPRAZolam (XANAX) 0.5 MG tablet Take 1 tablet (0.5 mg total) by mouth 2 (two) times daily as needed for anxiety. 20 tablet 0  . baclofen (LIORESAL) 20 MG tablet Take 1 tablet (20 mg total) by mouth 3 (three) times daily as needed for muscle spasms. (Patient taking differently: Take 20 mg by mouth See admin instructions. Once to twice a day for breakthrough spasms) 180 each 3  . Biotin 10000 MCG TABS Take 1,000 mcg by mouth daily.    Marland Kitchen buPROPion (WELLBUTRIN XL) 300 MG 24 hr tablet Take 1 tablet (300 mg total) by mouth  daily. 30 tablet 0  . CALCIUM CITRATE PO Take 2 tablets by mouth 2 (two) times daily.    . cetirizine (ZYRTEC) 10 MG tablet Take 10 mg by mouth daily.    . Cholecalciferol (VITAMIN D) 2000 units tablet Take 1 tablet (2,000 Units total) by mouth 2 (two) times daily.  0  . co-enzyme Q-10 30 MG capsule Take 30 mg by mouth daily.    . Cranberry 1000 MG CAPS Take by mouth.    . diltiazem (CARDIZEM CD) 180 MG 24 hr capsule Take 1 capsule (180 mg total) by mouth daily. 30 capsule 3  . docusate sodium (COLACE) 100 MG capsule Take 1 capsule (100 mg total) by mouth daily. 10 capsule 0  . fluticasone (FLONASE) 50 MCG/ACT nasal spray Place 2 sprays into both nostrils daily as needed for allergies.     . furosemide (LASIX) 40 MG tablet Take 2 tablets (80mg ) by mouth in the morning and 1 tablet (40mg ) in the afternoon    . gabapentin (NEURONTIN) 300 MG capsule TAKE 1 CAPSULE BY MOUTH UP  TO 4 TIMES DAILY 360 capsule 3  . HP ACTHAR 80 UNIT/ML injectable gel INJECT 80 UNITS (1ML) SUBCUTANEOUSLY EVERY OTHER DAY FOR A TOTAL OF 10 DAYS. REPEAT EVERY 30 DAYS. 5 mL 5  . lactulose, encephalopathy, (GENERLAC) 10 GM/15ML SOLN Take 15 mLs (10 g total) by  mouth 2 (two) times daily as needed. 946 mL 5  . levothyroxine (SYNTHROID, LEVOTHROID) 75 MCG tablet Take 75 mcg by mouth daily.    . Multiple Vitamins-Minerals (CENTRUM SILVER PO) Take 1 tablet by mouth daily.    . Nebivolol HCl (BYSTOLIC) 20 MG TABS Take 1 tablet (20 mg total) by mouth daily. 30 tablet   . Omega-3 Fatty Acids (FISH OIL) 1000 MG CAPS Take 1,000 mg by mouth daily.    Marland Kitchen omeprazole (PRILOSEC) 20 MG capsule Take 20 mg by mouth daily.     . potassium chloride SA (K-DUR,KLOR-CON) 20 MEQ tablet Take 1 tablet (20 mEq total) by mouth daily. Take two tabs daily (Patient taking differently: Take 20 mEq by mouth. Take two tabs daily) 60 tablet 0  . psyllium (METAMUCIL SMOOTH TEXTURE) 28 % packet Take 1 packet by mouth every other day.    . ranitidine (ZANTAC) 150  MG tablet Take 150 mg by mouth at bedtime.     . simvastatin (ZOCOR) 20 MG tablet Take 10 mg by mouth at bedtime.     . SYRINGE/NEEDLE, DISP, 1 ML (BD LUER-LOK SYRINGE) 25G X 5/8" 1 ML MISC Use as directed to inject  Acthar 10 each prn  . Tamsulosin HCl (FLOMAX) 0.4 MG CAPS Take 0.4 mg by mouth daily.    Marland Kitchen UNABLE TO FIND Baclofen pump: Continuous    . warfarin (COUMADIN) 5 MG tablet TAKE 1/2 TO 1 TABLET BY  MOUTH DAILY AS DIRECTED BY  COUMADIN CLINIC. 90 tablet 1   Current Facility-Administered Medications on File Prior to Visit  Medication Dose Route Frequency Provider Last Rate Last Dose  . baclofen (LIORESAL) intrathecal injection 40 mg/8mL  40 mg Intrathecal Once Kathrynn Ducking, MD        PAST MEDICAL HISTORY: Past Medical History:  Diagnosis Date  . Abnormality of gait 03/01/2015  . Anxiety   . Arthritis    "knees" (01/19/2016)  . Atrial fibrillation with RVR ([place]) 01/19/2016  . Back pain   . Chest pain   . Childhood asthma   . Chronic pain    "nerve pain from the MS"  . Constipation   . Depression   . DVT (deep venous thrombosis) ([place]) 1983   "RLE; may have just been phlebitis; it was before the age of dopplers"  . 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 GERD (gastroesophageal reflux disease)   . History of stress test 06/21/10   limited exam with some degree of breast attenuatin of the apex however a component of apical ischemia is not excluded, EF 62%; cardiac cath   . HLD (hyperlipidemia)   . Hx of echocardiogram 04/22/10   EF 55-60%, no valve issues  . Hypertension   . Hypothyroidism   . Joint pain   . Migraine    "none since I stopped beta blocker in 1990s" (01/19/2016)  . Multiple sclerosis ([place])    has had this may 1989-[place] reg doc  . OSA on CPAP   . Osteoarthritis   . PAF (paroxysmal atrial fibrillation) ([place])    on coumadin; documented on monitor 06/2010  . Pneumonia 11/2011  . Shortness of breath     . Thyroid disease     PAST SURGICAL HISTORY: Past Surgical History:  Procedure Laterality Date  . ANTERIOR CERVICAL DECOMP/DISCECTOMY FUSION  01/2004  . BACK SURGERY    . CARDIAC CATHETERIZATION  06/22/2010  normal coronary arteries, PAF  . CATARACT EXTRACTION, BILATERAL Bilateral   . CHILD BIRTHS     X2  . DILATION AND CURETTAGE OF UTERUS    . INFUSION PUMP IMPLANTATION  ~ 2009   baclofen infusion in lower abd  . PAIN PUMP REVISION N/A 07/29/2014   Procedure: Baclofen pump replacement;  Surgeon: Erline Levine, MD;  Location: [facility];  Service: Neurosurgery;  Laterality: N/A;  Baclofen pump replacement  . TONSILLECTOMY AND ADENOIDECTOMY    . VARICOSE VEIN SURGERY  ~ 1968  . VENOUS ABLATION  12/16/2010   radiofreq ablation -Dr Elisabeth Cara and [facility]  . WISDOM TOOTH EXTRACTION      SOCIAL HISTORY: Social History   Tobacco Use  . Smoking status: Former Smoker    Packs/day: 0.50    Years: 7.00    Pack years: 3.50    Types: Cigarettes    Last attempt to quit: 08/21/1976    Years since quitting: 41.4  . Smokeless tobacco: Never Used  Substance Use Topics  . Alcohol use: Yes    Alcohol/week: 0.6 oz    Types: 1 Glasses of wine per week  . Drug use: No    FAMILY HISTORY: Family History  Problem Relation Age of Onset  . Coronary artery disease Father        at age 61  . Hyperlipidemia Father   . Thyroid disease Father   . Coronary artery disease Maternal Grandmother   . Depression Maternal Grandmother   . Cancer Paternal Grandmother   . Depression Mother   . Sudden death Mother   . Anxiety disorder Mother   . Alcoholism Son     ROS: Review of Systems  Constitutional: Positive for weight loss.    PHYSICAL EXAM: Blood pressure 116/77, pulse 76, temperature 98.1 F (36.7 C), temperature source Oral, height 5\' 3"  (1.6 m), weight 215 lb (97.5 kg), SpO2 97 %. Body mass index is 38.09 kg/m. Physical Exam  Constitutional: She is oriented to person, place, and time.  She appears well-developed and well-nourished.  Cardiovascular: Normal rate.  Pulmonary/Chest: Effort normal.  Musculoskeletal: Normal range of motion. She exhibits edema.  Neurological: She is oriented to person, place, and time.  Skin: Skin is warm and dry.  Psychiatric: She has a normal mood and affect. Her behavior is normal.  Vitals reviewed.   RECENT LABS AND TESTS: BMET    Component Value Date/Time   NA 139 01/07/2018 1508   K 4.6 01/07/2018 1508   CL 99 01/07/2018 1508   CO2 25 01/07/2018 1508   GLUCOSE 111 (H) 01/07/2018 1508   GLUCOSE 92 07/08/2017 1214   BUN 23 01/07/2018 1508   CREATININE 0.64 01/07/2018 1508   CALCIUM 9.4 01/07/2018 1508   GFRNONAA 91 01/07/2018 1508   GFRAA 105 01/07/2018 1508   Lab Results  Component Value Date   HGBA1C 5.2 10/01/2017   HGBA1C 5.4 03/08/2017   HGBA1C  04/21/2010    5.5 (NOTE)                                                                       According to the ADA Clinical Practice Recommendations for 2011, when HbA1c is used as a screening test:   >=6.5%  Diagnostic of Diabetes Mellitus           (if abnormal result  is confirmed)  5.7-6.4%   Increased risk of developing Diabetes Mellitus  References:Diagnosis and Classification of Diabetes Mellitus,Diabetes TGGY,6948,54(OEVOJ 1):S62-S69 and Standards of Medical Care in         Diabetes - 2011,Diabetes Care,2011,34  (Suppl 1):S11-S61.   Lab Results  Component Value Date   INSULIN 20.4 11/27/2017   INSULIN 20.4 03/08/2017   CBC    Component Value Date/Time   WBC 7.0 10/01/2017   WBC 8.4 07/08/2017 1214   RBC 4.83 07/08/2017 1214   HGB 15.1 10/01/2017   HGB 14.1 03/08/2017 1152   HCT 47 (A) 10/01/2017   HCT 43.0 03/08/2017 1152   PLT 185 10/01/2017   PLT 219 12/18/2014 1157   MCV 93.2 07/08/2017 1214   MCV 93 03/08/2017 1152   MCH 31.5 07/08/2017 1214   MCHC 33.8 07/08/2017 1214   RDW 13.3 07/08/2017 1214   RDW 13.3 03/08/2017 1152   LYMPHSABS 1.6 07/08/2017  1214   LYMPHSABS 2.2 03/08/2017 1152   MONOABS 1.3 (H) 07/08/2017 1214   EOSABS 0.2 07/08/2017 1214   EOSABS 0.1 03/08/2017 1152   BASOSABS 0.0 07/08/2017 1214   BASOSABS 0.0 03/08/2017 1152   Iron/TIBC/Ferritin/ %Sat No results found for: IRON, TIBC, FERRITIN, IRONPCTSAT Lipid Panel     Component Value Date/Time   CHOL 129 11/27/2017 1011   TRIG 60 11/27/2017 1011   HDL 51 11/27/2017 1011   CHOLHDL 3.0 04/22/2010 0536   VLDL 29 04/22/2010 0536   LDLCALC 66 11/27/2017 1011   Hepatic Function Panel     Component Value Date/Time   PROT 6.1 01/07/2018 1508   ALBUMIN 4.2 01/07/2018 1508   AST 16 01/07/2018 1508   ALT 21 01/07/2018 1508   ALKPHOS 56 01/07/2018 1508   BILITOT 0.3 01/07/2018 1508      Component Value Date/Time   TSH 0.651 11/27/2017 1011   TSH 2.410 03/08/2017 1152   TSH 1.083 08/06/2016 0647   TSH 1.871 01/19/2016 2302   TSH 0.206 (L) 03/10/2011 1351    ASSESSMENT AND PLAN: Edema, unspecified type  Class 2 severe obesity with serious comorbidity and body mass index (BMI) of 38.0 to 38.9 in adult, unspecified obesity type ([place])  PLAN:  Edema Tifanny will continue her medications as prescribed. She will continue with diet, exercise and weight loss and we will continue to monitor.  We spent > than 50% of the 15 minute visit on the counseling as documented in the note.  Obesity Shalimar is currently in the action stage of change. As such, her goal is to continue with weight loss efforts She has agreed to follow the Category 2 plan Smith has been instructed to work up to a goal of 150 minutes of combined cardio and strengthening exercise per week for weight loss and overall health benefits. We discussed the following Behavioral Modification Strategies today: increasing lean protein intake, decreasing simple carbohydrates  and decreasing sodium intake  Shanteria has agreed to follow up with our clinic in 3 to 4 weeks. She was informed of the  importance of frequent follow up visits to maximize her success with intensive lifestyle modifications for her multiple health conditions.   OBESITY BEHAVIORAL INTERVENTION VISIT  Today's visit was # 17 out of 22.  Starting weight: 257 lbs Starting date: 03/08/17 Today's weight : 215 lbs Today's date: 01/28/2018 Total lbs lost to date: 36 (Patients must lose 7 lbs  in the first 6 months to continue with counseling)   ASK: We discussed the diagnosis of obesity with Toni [name] today and Toni [name] agreed to give [name] permission to discuss obesity behavioral modification therapy today.  ASSESS: Toni [name] has the diagnosis of obesity and her BMI today is 38.09 Toni [name] is in the action stage of change   ADVISE: Toni [name] was educated on the multiple health risks of obesity as well as the benefit of weight loss to improve her health. She was advised of the need for long term treatment and the importance of lifestyle modifications.  AGREE: Multiple dietary modification options and treatment options were discussed and  Toni [name] agreed to the above obesity treatment plan.  I, Doreene Nest, am acting as transcriptionist for Dennard Nip, MD  I have reviewed the above documentation for accuracy and completeness, and I agree with the above. -Dennard Nip, MD

## 2018-02-01 ENCOUNTER — Ambulatory Visit: Payer: Medicare Other | Admitting: Neurology

## 2018-02-01 DIAGNOSIS — D49512 Neoplasm of unspecified behavior of left kidney: Secondary | ICD-10-CM | POA: Diagnosis not present

## 2018-02-01 DIAGNOSIS — N281 Cyst of kidney, acquired: Secondary | ICD-10-CM | POA: Diagnosis not present

## 2018-02-01 DIAGNOSIS — N312 Flaccid neuropathic bladder, not elsewhere classified: Secondary | ICD-10-CM | POA: Diagnosis not present

## 2018-02-04 ENCOUNTER — Encounter: Payer: Self-pay | Admitting: Neurology

## 2018-02-04 ENCOUNTER — Ambulatory Visit (INDEPENDENT_AMBULATORY_CARE_PROVIDER_SITE_OTHER): Payer: Medicare Other | Admitting: Neurology

## 2018-02-04 VITALS — BP 134/68 | HR 75 | Ht 63.0 in

## 2018-02-04 DIAGNOSIS — G35 Multiple sclerosis: Secondary | ICD-10-CM | POA: Diagnosis not present

## 2018-02-04 MED ORDER — BACLOFEN 40 MG/20ML IT SOLN
80.0000 mg | Freq: Once | INTRATHECAL | Status: AC
  Administered 2018-02-04: 80 mg via INTRATHECAL

## 2018-02-04 NOTE — Progress Notes (Signed)
Please refer to baclofen pump procedure note. 

## 2018-02-04 NOTE — Procedures (Signed)
     History:  Toni [name] is a 72 year old patient with a history of multiple sclerosis with an associated gait disorder.  The patient has a baclofen pump in place, she indicates that her spasticity level is tolerable at this point.  She returns for an evaluation.  Baclofen pump refill note  The baclofen pump site was cleaned with Betadine solution. A 21-gauge needle was inserted into the pump port site. Approximately 5 cc of residual baclofen was removed, the baclofen pump indicates a residual of 1.9 cc. 40 cc of replacement baclofen was placed into the pump at 2000 mcg/cc concentration.  The pump was reprogrammed for the following settings: Simple continuous, 700.3 mcg/day  The alarm volume is set at 1.5 cc. The next alarm date is May 24, 2018.  The patient tolerated the procedure well. There were no complications of the above procedure.  The [place] number is [phone] The baclofen expiration date is February 2021. The baclofen lot number is [id number].

## 2018-02-05 ENCOUNTER — Ambulatory Visit: Payer: Self-pay | Admitting: Neurology

## 2018-02-05 ENCOUNTER — Ambulatory Visit: Payer: Medicare Other | Admitting: Neurology

## 2018-02-07 ENCOUNTER — Other Ambulatory Visit (INDEPENDENT_AMBULATORY_CARE_PROVIDER_SITE_OTHER): Payer: Self-pay | Admitting: Family Medicine

## 2018-02-07 DIAGNOSIS — E876 Hypokalemia: Secondary | ICD-10-CM

## 2018-02-12 ENCOUNTER — Telehealth (INDEPENDENT_AMBULATORY_CARE_PROVIDER_SITE_OTHER): Payer: Self-pay | Admitting: Orthopaedic Surgery

## 2018-02-12 ENCOUNTER — Other Ambulatory Visit (INDEPENDENT_AMBULATORY_CARE_PROVIDER_SITE_OTHER): Payer: Self-pay

## 2018-02-12 DIAGNOSIS — E876 Hypokalemia: Secondary | ICD-10-CM

## 2018-02-12 MED ORDER — POTASSIUM CHLORIDE CRYS ER 20 MEQ PO TBCR: 40.0000 meq | EXTENDED_RELEASE_TABLET | Freq: Once | ORAL | 0 refills | Status: DC

## 2018-02-12 NOTE — Telephone Encounter (Signed)
fyi patient requesting cortisone inj in her left wrist and left knee, appt made 7/3.

## 2018-02-18 ENCOUNTER — Ambulatory Visit (INDEPENDENT_AMBULATORY_CARE_PROVIDER_SITE_OTHER): Payer: Medicare Other | Admitting: Pharmacist Clinician (PhC)/ Clinical Pharmacy Specialist

## 2018-02-18 ENCOUNTER — Ambulatory Visit (INDEPENDENT_AMBULATORY_CARE_PROVIDER_SITE_OTHER): Payer: Medicare Other | Admitting: Family Medicine

## 2018-02-18 VITALS — BP 112/71 | HR 100 | Temp 98.4°F | Ht 63.0 in | Wt 217.0 lb

## 2018-02-18 DIAGNOSIS — Z7901 Long term (current) use of anticoagulants: Secondary | ICD-10-CM | POA: Diagnosis not present

## 2018-02-18 DIAGNOSIS — I482 Chronic atrial fibrillation: Secondary | ICD-10-CM | POA: Diagnosis not present

## 2018-02-18 DIAGNOSIS — I4821 Permanent atrial fibrillation: Secondary | ICD-10-CM

## 2018-02-18 DIAGNOSIS — E876 Hypokalemia: Secondary | ICD-10-CM | POA: Diagnosis not present

## 2018-02-18 DIAGNOSIS — Z6838 Body mass index (BMI) 38.0-38.9, adult: Secondary | ICD-10-CM

## 2018-02-18 LAB — POCT INR: INR: 2 (ref 2.0–3.0)

## 2018-02-18 NOTE — Progress Notes (Signed)
Office: [phone]  /  Fax: [phone]   HPI:   Chief Complaint: OBESITY Toni [name] is here to discuss her progress with her obesity treatment plan. She is on the Category 2 plan and is following her eating plan approximately 90 % of the time. She states she is doing physical therapy for 60 minutes 3 times per week. Toni [name] has not been concentrating on weight loss as much because she got news that a recent CT scan showed a suspicious lesion on her left kidney (no records in Epic) and she has a Nephrology appointment tomorrow to discuss results. She is not eating much due to her worries. Her weight is 217 lb (98.4 kg) today and has gained 1 pound since her last visit. She has lost 40 lbs since starting treatment with [name].  Hypokalemia Toni [name] is on diuretic and KCI, had an episode of unexpected hypokalemia earlier this year and she is concerned she may not be on the right dose.  ALLERGIES: Allergies  Allergen Reactions  . Hydrocodone Nausea And Vomiting    Hydrocodone causes vomiting  . Oxycodone Nausea And Vomiting    Oral oxycodone causes vomiting  . Chlorhexidine Gluconate Rash    Sores at site  . Zoloft [Sertraline] Hives, Swelling and Rash    MEDICATIONS: Current Outpatient Medications on File Prior to Visit  Medication Sig Dispense Refill  . acetaminophen (TYLENOL) 500 MG tablet Take 500-1,000 mg by mouth every 6 (six) hours as needed for fever or headache (or pain).    Marland Kitchen ALPRAZolam (XANAX) 0.5 MG tablet Take 1 tablet (0.5 mg total) by mouth 2 (two) times daily as needed for anxiety. 20 tablet 0  . baclofen (LIORESAL) 20 MG tablet Take 1 tablet (20 mg total) by mouth 3 (three) times daily as needed for muscle spasms. (Patient taking differently: Take 20 mg by mouth See admin instructions. Once to twice a day for breakthrough spasms) 180 each 3  . Biotin (PA BIOTIN) 1000 MCG tablet Take 1,000 mcg by mouth daily.    Marland Kitchen buPROPion (WELLBUTRIN XL) 300 MG 24 hr tablet Take 1  tablet (300 mg total) by mouth daily. 30 tablet 0  . CALCIUM CITRATE PO Take 2 tablets by mouth 2 (two) times daily.    . cetirizine (ZYRTEC) 10 MG tablet Take 10 mg by mouth daily.    . Cholecalciferol (VITAMIN D) 2000 units tablet Take 1 tablet (2,000 Units total) by mouth 2 (two) times daily.  0  . co-enzyme Q-10 30 MG capsule Take 30 mg by mouth daily.    . Cranberry 1000 MG CAPS Take by mouth.    . diltiazem (CARDIZEM CD) 180 MG 24 hr capsule Take 1 capsule (180 mg total) by mouth daily. 30 capsule 3  . docusate sodium (COLACE) 100 MG capsule Take 1 capsule (100 mg total) by mouth daily. 10 capsule 0  . fluticasone (FLONASE) 50 MCG/ACT nasal spray Place 2 sprays into both nostrils daily as needed for allergies.     . furosemide (LASIX) 40 MG tablet Take 2 tablets (80mg ) by mouth in the morning and 1 tablet (40mg ) in the afternoon    . gabapentin (NEURONTIN) 300 MG capsule TAKE 1 CAPSULE BY MOUTH UP  TO 4 TIMES DAILY 360 capsule 3  . HP ACTHAR 80 UNIT/ML injectable gel INJECT 80 UNITS (1ML) SUBCUTANEOUSLY EVERY OTHER DAY FOR A TOTAL OF 10 DAYS. REPEAT EVERY 30 DAYS. 5 mL 5  . lactulose, encephalopathy, (GENERLAC) 10 GM/15ML SOLN Take 15 mLs (  10 g total) by mouth 2 (two) times daily as needed. 946 mL 5  . levothyroxine (SYNTHROID, LEVOTHROID) 75 MCG tablet Take 75 mcg by mouth daily.    . Multiple Vitamins-Minerals (CENTRUM SILVER PO) Take 1 tablet by mouth daily.    . Nebivolol HCl (BYSTOLIC) 20 MG TABS Take 1 tablet (20 mg total) by mouth daily. 30 tablet   . Omega-3 Fatty Acids (FISH OIL) 1000 MG CAPS Take 1,000 mg by mouth daily.    Marland Kitchen omeprazole (PRILOSEC) 20 MG capsule Take 20 mg by mouth daily.     . psyllium (METAMUCIL SMOOTH TEXTURE) 28 % packet Take 1 packet by mouth every other day.    . ranitidine (ZANTAC) 150 MG tablet Take 150 mg by mouth at bedtime.     . simvastatin (ZOCOR) 20 MG tablet Take 10 mg by mouth at bedtime.     . SYRINGE/NEEDLE, DISP, 1 ML (BD LUER-LOK SYRINGE) 25G X  5/8" 1 ML MISC Use as directed to inject  Acthar 10 each prn  . Tamsulosin HCl (FLOMAX) 0.4 MG CAPS Take 0.4 mg by mouth daily.    Marland Kitchen UNABLE TO FIND Baclofen pump: Continuous    . warfarin (COUMADIN) 5 MG tablet TAKE 1/2 TO 1 TABLET BY  MOUTH DAILY AS DIRECTED BY  COUMADIN CLINIC. 90 tablet 1  . potassium chloride SA (K-DUR,KLOR-CON) 20 MEQ tablet Take 2 tablets (40 mEq total) by mouth once for 1 dose. 60 tablet 0   Current Facility-Administered Medications on File Prior to Visit  Medication Dose Route Frequency Provider Last Rate Last Dose  . baclofen (LIORESAL) intrathecal injection 40 mg/16mL  40 mg Intrathecal Once Kathrynn Ducking, MD        PAST MEDICAL HISTORY: Past Medical History:  Diagnosis Date  . Abnormality of gait 03/01/2015  . Anxiety   . Arthritis    "knees" (01/19/2016)  . Atrial fibrillation with RVR ([place]) 01/19/2016  . Back pain   . Chest pain   . Childhood asthma   . Chronic pain    "nerve pain from the MS"  . Constipation   . Depression   . DVT (deep venous thrombosis) ([place]) 1983   "RLE; may have just been phlebitis; it was before the age of dopplers"  . 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 GERD (gastroesophageal reflux disease)   . History of stress test 06/21/10   limited exam with some degree of breast attenuatin of the apex however a component of apical ischemia is not excluded, EF 62%; cardiac cath   . HLD (hyperlipidemia)   . Hx of echocardiogram 04/22/10   EF 55-60%, no valve issues  . Hypertension   . Hypothyroidism   . Joint pain   . Migraine    "none since I stopped beta blocker in 1990s" (01/19/2016)  . Multiple sclerosis ([place])    has had this may 1989-[facility] reg doc  . OSA on CPAP   . Osteoarthritis   . PAF (paroxysmal atrial fibrillation) ([place])    on coumadin; documented on monitor 06/2010  . Pneumonia 11/2011  . Shortness of breath   . Thyroid disease     PAST SURGICAL  HISTORY: Past Surgical History:  Procedure Laterality Date  . ANTERIOR CERVICAL DECOMP/DISCECTOMY FUSION  01/2004  . BACK SURGERY    . CARDIAC CATHETERIZATION  06/22/2010   normal coronary arteries, PAF  . CATARACT EXTRACTION, BILATERAL  Bilateral   . CHILD BIRTHS     X2  . DILATION AND CURETTAGE OF UTERUS    . INFUSION PUMP IMPLANTATION  ~ 2009   baclofen infusion in lower abd  . PAIN PUMP REVISION N/A 07/29/2014   Procedure: Baclofen pump replacement;  Surgeon: Erline Levine, MD;  Location: [place] [facility];  Service: Neurosurgery;  Laterality: N/A;  Baclofen pump replacement  . TONSILLECTOMY AND ADENOIDECTOMY    . VARICOSE VEIN SURGERY  ~ 1968  . VENOUS ABLATION  12/16/2010   radiofreq ablation -Dr Elisabeth Cara and [facility]  . WISDOM TOOTH EXTRACTION      SOCIAL HISTORY: Social History   Tobacco Use  . Smoking status: Former Smoker    Packs/day: 0.50    Years: 7.00    Pack years: 3.50    Types: Cigarettes    Last attempt to quit: 08/21/1976    Years since quitting: 41.5  . Smokeless tobacco: Never Used  Substance Use Topics  . Alcohol use: Yes    Alcohol/week: 0.6 oz    Types: 1 Glasses of wine per week  . Drug use: No    FAMILY HISTORY: Family History  Problem Relation Age of Onset  . Coronary artery disease Father        at age 75  . Hyperlipidemia Father   . Thyroid disease Father   . Coronary artery disease Maternal Grandmother   . Depression Maternal Grandmother   . Cancer Paternal Grandmother   . Depression Mother   . Sudden death Mother   . Anxiety disorder Mother   . Alcoholism Son     ROS: Review of Systems  Constitutional: Negative for weight loss.    PHYSICAL EXAM: Blood pressure 112/71, pulse 100, temperature 98.4 F (36.9 C), temperature source Oral, height 5\' 3"  (1.6 m), weight 217 lb (98.4 kg), SpO2 96 %. Body mass index is 38.44 kg/m. Physical Exam  Constitutional: She is oriented to person, place, and time. She appears well-developed and  well-nourished.  Cardiovascular: Normal rate.  Pulmonary/Chest: Effort normal.  Musculoskeletal: Normal range of motion.  Neurological: She is oriented to person, place, and time.  Skin: Skin is warm and dry.  Psychiatric: She has a normal mood and affect. Her behavior is normal.  Vitals reviewed.   RECENT LABS AND TESTS: BMET    Component Value Date/Time   NA 139 01/07/2018 1508   K 4.6 01/07/2018 1508   CL 99 01/07/2018 1508   CO2 25 01/07/2018 1508   GLUCOSE 111 (H) 01/07/2018 1508   GLUCOSE 92 07/08/2017 1214   BUN 23 01/07/2018 1508   CREATININE 0.64 01/07/2018 1508   CALCIUM 9.4 01/07/2018 1508   GFRNONAA 91 01/07/2018 1508   GFRAA 105 01/07/2018 1508   Lab Results  Component Value Date   HGBA1C 5.2 10/01/2017   HGBA1C 5.4 03/08/2017   HGBA1C  04/21/2010    5.5 (NOTE)                                                                       According to the ADA Clinical Practice Recommendations for 2011, when HbA1c is used as a screening test:   >=6.5%   Diagnostic of Diabetes Mellitus           (  if abnormal result  is confirmed)  5.7-6.4%   Increased risk of developing Diabetes Mellitus  References:Diagnosis and Classification of Diabetes Mellitus,Diabetes BJYN,8295,62(ZHYQM 1):S62-S69 and Standards of Medical Care in         Diabetes - 2011,Diabetes VHQI,6962,95  (Suppl 1):S11-S61.   Lab Results  Component Value Date   INSULIN 20.4 11/27/2017   INSULIN 20.4 03/08/2017   CBC    Component Value Date/Time   WBC 7.0 10/01/2017   WBC 8.4 07/08/2017 1214   RBC 4.83 07/08/2017 1214   HGB 15.1 10/01/2017   HGB 14.1 03/08/2017 1152   HCT 47 (A) 10/01/2017   HCT 43.0 03/08/2017 1152   PLT 185 10/01/2017   PLT 219 12/18/2014 1157   MCV 93.2 07/08/2017 1214   MCV 93 03/08/2017 1152   MCH 31.5 07/08/2017 1214   MCHC 33.8 07/08/2017 1214   RDW 13.3 07/08/2017 1214   RDW 13.3 03/08/2017 1152   LYMPHSABS 1.6 07/08/2017 1214   LYMPHSABS 2.2 03/08/2017 1152   MONOABS  1.3 (H) 07/08/2017 1214   EOSABS 0.2 07/08/2017 1214   EOSABS 0.1 03/08/2017 1152   BASOSABS 0.0 07/08/2017 1214   BASOSABS 0.0 03/08/2017 1152   Iron/TIBC/Ferritin/ %Sat No results found for: IRON, TIBC, FERRITIN, IRONPCTSAT Lipid Panel     Component Value Date/Time   CHOL 129 11/27/2017 1011   TRIG 60 11/27/2017 1011   HDL 51 11/27/2017 1011   CHOLHDL 3.0 04/22/2010 0536   VLDL 29 04/22/2010 0536   LDLCALC 66 11/27/2017 1011   Hepatic Function Panel     Component Value Date/Time   PROT 6.1 01/07/2018 1508   ALBUMIN 4.2 01/07/2018 1508   AST 16 01/07/2018 1508   ALT 21 01/07/2018 1508   ALKPHOS 56 01/07/2018 1508   BILITOT 0.3 01/07/2018 1508      Component Value Date/Time   TSH 0.651 11/27/2017 1011   TSH 2.410 03/08/2017 1152   TSH 1.083 08/06/2016 0647   TSH 1.871 01/19/2016 2302   TSH 0.206 (L) 03/10/2011 1351    ASSESSMENT AND PLAN: Hypokalemia - Plan: Comprehensive metabolic panel  Class 2 severe obesity with serious comorbidity and body mass index (BMI) of 38.0 to 38.9 in adult, unspecified obesity type (HCC)  PLAN:  Hypokalemia We will check labs and follow up. Toni [name] agrees to continue taking KCI at 20 meq BID, and she agrees to follow up with our clinic in 3 weeks.  We spent > than 50% of the 15 minute visit on the counseling as documented in the note.  Obesity Toni [name] is currently in the action stage of change. As such, her goal is to continue with weight loss efforts She has agreed to follow the Category 2 plan Toni [name] has been instructed to work up to a goal of 150 minutes of combined cardio and strengthening exercise per week for weight loss and overall health benefits. We discussed the following Behavioral Modification Strategies today: increasing lean protein intake, decreasing simple carbohydrates, work on meal planning and easy cooking plans, and no skipping meals Toni [name]'s goal is to remember to eat and not skip meals.  Toni [name]  has agreed to follow up with our clinic in 3 weeks. She was informed of the importance of frequent follow up visits to maximize her success with intensive lifestyle modifications for her multiple health conditions.   OBESITY BEHAVIORAL INTERVENTION VISIT  Today's visit was # 18 out of 22.  Starting weight: 257 lbs Starting date: 03/08/17 Today's weight : 217 lbs Today's date:  02/18/2018 Total lbs lost to date: 59 (Patients must lose 7 lbs in the first 6 months to continue with counseling)   ASK: We discussed the diagnosis of obesity with Toni [name] today and Toni [name] agreed to give [name] permission to discuss obesity behavioral modification therapy today.  ASSESS: Toni [name] has the diagnosis of obesity and her BMI today is 38.45 Toni [name] is in the action stage of change   ADVISE: Toni [name] was educated on the multiple health risks of obesity as well as the benefit of weight loss to improve her health. She was advised of the need for long term treatment and the importance of lifestyle modifications.  AGREE: Multiple dietary modification options and treatment options were discussed and  Jaimy agreed to the above obesity treatment plan.  I, Toni [name], am acting as transcriptionist for Toni Nip, MD  I have reviewed the above documentation for accuracy and completeness, and I agree with the above. -Toni Nip, MD

## 2018-02-19 DIAGNOSIS — M6281 Muscle weakness (generalized): Secondary | ICD-10-CM | POA: Diagnosis not present

## 2018-02-19 DIAGNOSIS — N312 Flaccid neuropathic bladder, not elsewhere classified: Secondary | ICD-10-CM | POA: Diagnosis not present

## 2018-02-19 DIAGNOSIS — Z9181 History of falling: Secondary | ICD-10-CM | POA: Diagnosis not present

## 2018-02-19 DIAGNOSIS — C642 Malignant neoplasm of left kidney, except renal pelvis: Secondary | ICD-10-CM | POA: Diagnosis not present

## 2018-02-19 DIAGNOSIS — M25669 Stiffness of unspecified knee, not elsewhere classified: Secondary | ICD-10-CM | POA: Diagnosis not present

## 2018-02-19 DIAGNOSIS — R262 Difficulty in walking, not elsewhere classified: Secondary | ICD-10-CM | POA: Diagnosis not present

## 2018-02-19 LAB — COMPREHENSIVE METABOLIC PANEL
ALBUMIN: 4.2 g/dL (ref 3.5–4.8)
ALT: 31 IU/L (ref 0–32)
AST: 18 IU/L (ref 0–40)
Albumin/Globulin Ratio: 2.2 (ref 1.2–2.2)
Alkaline Phosphatase: 62 IU/L (ref 39–117)
BILIRUBIN TOTAL: 0.3 mg/dL (ref 0.0–1.2)
BUN / CREAT RATIO: 38 — AB (ref 12–28)
BUN: 23 mg/dL (ref 8–27)
CO2: 26 mmol/L (ref 20–29)
CREATININE: 0.6 mg/dL (ref 0.57–1.00)
Calcium: 9.5 mg/dL (ref 8.7–10.3)
Chloride: 99 mmol/L (ref 96–106)
GFR calc non Af Amer: 93 mL/min/{1.73_m2} (ref >59)
GFR, EST AFRICAN AMERICAN: 107 mL/min/{1.73_m2} (ref >59)
GLUCOSE: 125 mg/dL — AB (ref 65–99)
Globulin, Total: 1.9 g/dL (ref 1.5–4.5)
Potassium: 4.3 mmol/L (ref 3.5–5.2)
Sodium: 139 mmol/L (ref 134–144)
TOTAL PROTEIN: 6.1 g/dL (ref 6.0–8.5)

## 2018-02-20 ENCOUNTER — Encounter (INDEPENDENT_AMBULATORY_CARE_PROVIDER_SITE_OTHER): Payer: Self-pay | Admitting: Orthopaedic Surgery

## 2018-02-20 ENCOUNTER — Ambulatory Visit (INDEPENDENT_AMBULATORY_CARE_PROVIDER_SITE_OTHER): Payer: Medicare Other | Admitting: Orthopaedic Surgery

## 2018-02-20 DIAGNOSIS — G5602 Carpal tunnel syndrome, left upper limb: Secondary | ICD-10-CM | POA: Diagnosis not present

## 2018-02-20 DIAGNOSIS — G8929 Other chronic pain: Secondary | ICD-10-CM

## 2018-02-20 DIAGNOSIS — M25562 Pain in left knee: Secondary | ICD-10-CM | POA: Diagnosis not present

## 2018-02-20 MED ORDER — METHYLPREDNISOLONE ACETATE 40 MG/ML IJ SUSP
40.0000 mg | INTRAMUSCULAR | Status: AC | PRN
  Administered 2018-02-20: 40 mg via INTRA_ARTICULAR

## 2018-02-20 MED ORDER — LIDOCAINE HCL 1 % IJ SOLN
1.0000 mL | INTRAMUSCULAR | Status: AC | PRN
  Administered 2018-02-20: 1 mL

## 2018-02-20 MED ORDER — LIDOCAINE HCL 1 % IJ SOLN
3.0000 mL | INTRAMUSCULAR | Status: AC | PRN
  Administered 2018-02-20: 3 mL

## 2018-02-20 MED ORDER — METHYLPREDNISOLONE ACETATE 40 MG/ML IJ SUSP
40.0000 mg | INTRAMUSCULAR | Status: AC | PRN
  Administered 2018-02-20: 40 mg

## 2018-02-20 NOTE — Progress Notes (Signed)
Office Visit Note   Patient: Toni [name]           Date of Birth: 1946-04-09           MRN: [id number] Visit Date: 02/20/2018              Requested by: Prince Solian, MD [address] [place],  [address] PCP: Prince Solian, MD   Assessment & Plan: Visit Diagnoses:  1. Carpal tunnel syndrome, left upper limb   2. Chronic pain of left knee     Plan: I was able to place a steroid injection in transverse carpal ligament on the left side as well as the left knee today.  She understands the risk and benefits of these types of injections.  She also understands that I am continuing to recommend at least a left open carpal tunnel release.  All question concerns were answered and addressed.  Follow-up will be as needed.  Follow-Up Instructions: Return if symptoms worsen or fail to improve.   Orders:  Orders Placed This Encounter  Procedures  . Hand/UE Inj  . Large Joint Inj   No orders of the defined types were placed in this encounter.     Procedures: Hand/UE Inj: L carpal tunnel for carpal tunnel syndrome on 02/20/2018 11:54 AM Medications: 1 mL lidocaine 1 %; 40 mg methylPREDNISolone acetate 40 MG/ML  Large Joint Inj: L knee on 02/20/2018 11:54 AM Indications: diagnostic evaluation and pain Details: 22 G 1.5 in needle, superolateral approach  Arthrogram: No  Medications: 3 mL lidocaine 1 %; 40 mg methylPREDNISolone acetate 40 MG/ML Outcome: tolerated well, no immediate complications Procedure, treatment alternatives, risks and benefits explained, specific risks discussed. Consent was given by the patient. Immediately prior to procedure a time out was called to verify the correct patient, procedure, equipment, support staff and site/side marked as required. Patient was prepped and draped in the usual sterile fashion.       Clinical Data: No additional findings.   Subjective: Chief Complaint  Patient presents with  . Left Knee - Pain  . Left Wrist -  Pain  Patient is well-known to the office.  She mainly gets around in a wheelchair due to her multiple sclerosis.  She has known significant carpal tunnel syndrome her left upper extremity.  She has no issues with her left knee.  She is reluctant to proceed with any type of carpal tunnel surgery.  She has had steroid injections in her transverse carpal ligament in her knee before and she would like to have these again today.  There is been no acute changes in her chronic medical conditions.  HPI  Review of Systems She currently denies any headache, chest pain, shortness of breath, fever, chills, nausea, vomiting.  Objective: Vital Signs: There were no vitals taken for this visit.  Physical Exam She is alert and oriented x3 and in no acute distress. Ortho Exam Examination of her left hand does show muscle atrophy and weakness.  She has a weak grip and pinch strength as well.  She has positive Phalen's and Tinel sign.  Examination of her left knee does show a lateral joint line tenderness and lateral tenderness over the iliotibial band as well.  She lacks full extension by a few degrees and flexion is to about 110.  Some of this is limited by her obesity. Specialty Comments:  No specialty comments available.  Imaging: No results found.   PMFS History: Patient Active Problem List   Diagnosis Date  Noted  . 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 Permanent atrial fibrillation ([place]) 11/21/2017  . Tightness of leg fascia 11/21/2017  . Muscle spasm of both lower legs 11/21/2017  . Carpal tunnel syndrome, left upper limb 09/13/2017  . Carpal tunnel syndrome, right upper limb 09/13/2017  . Other insomnia 06/11/2017  . Depression 06/11/2017  . Abdominal swelling 03/29/2017  . Central apnea 03/26/2017  . Class 3 obesity with serious comorbidity and body mass index (BMI) of 40.0 to 44.9 in adult 03/22/2017  . Vitamin D deficiency 03/22/2017  . Prediabetes  03/22/2017  . Venous insufficiency 02/14/2017  . Right hand weakness 08/06/2016  . Nonischemic cardiomyopathy ([place]) 05/09/2016  . Localized edema 05/09/2016  . Long term current use of anticoagulant therapy 02/03/2016  . 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 Atrial fibrillation with RVR ([place]) 01/19/2016  . 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 Obesity hypoventilation syndrome ([place]) 03/30/2015  . Abnormality of gait 03/01/2015  . Chronic constipation with overflow incontinence 01/25/2015  . 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 MS (multiple sclerosis) ([place]) 07/29/2014  . Closed right ankle fracture 10/30/2013  . Leucocytosis 10/30/2013  . Varicose veins of both lower extremities with complications 27/01/2375  . OSA on CPAP 06/04/2013  . Muscle spasms of lower extremity 02/24/2013  . Long term (current) use of anticoagulants 11/05/2012  . 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 Aspiration pneumonia vs Failed outpatient Rx of CAP 11/26/2011  . Essential hypertension   . Thyroid disease   . HLD (hyperlipidemia)    Past Medical History:  Diagnosis Date  . Abnormality of gait 03/01/2015  . Anxiety   . Arthritis    "knees" (01/19/2016)  . Atrial fibrillation with RVR ([place]) 01/19/2016  . Back pain   . Chest pain   . Childhood asthma   . Chronic pain    "nerve pain from the MS"  . Constipation   . Depression   . DVT (deep venous thrombosis) ([place]) 1983   "RLE; may have just been phlebitis; it was before the age of dopplers"  . 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 GERD (gastroesophageal reflux disease)   . History of stress test 06/21/10   limited exam with some degree  of breast attenuatin of the apex however a component of apical ischemia is not excluded, EF 62%; cardiac cath   . HLD (hyperlipidemia)   . Hx of echocardiogram 04/22/10   EF 55-60%, no valve issues  . Hypertension   . Hypothyroidism   . Joint pain   . Migraine    "none since I stopped beta blocker in 1990s" (01/19/2016)  . Multiple sclerosis ([place])    has had this may 1989-[facility] reg doc  . OSA on CPAP   . Osteoarthritis   . PAF (paroxysmal atrial fibrillation) ([place])    on coumadin; documented on monitor 06/2010  . Pneumonia 11/2011  . Shortness of breath   . Thyroid disease     Family History  Problem Relation Age of Onset  . Coronary artery disease Father        at age 35  . Hyperlipidemia Father   . Thyroid disease Father   .  Coronary artery disease Maternal Grandmother   . Depression Maternal Grandmother   . Cancer Paternal Grandmother   . Depression Mother   . Sudden death Mother   . Anxiety disorder Mother   . Alcoholism Son     Past Surgical History:  Procedure Laterality Date  . ANTERIOR CERVICAL DECOMP/DISCECTOMY FUSION  01/2004  . BACK SURGERY    . CARDIAC CATHETERIZATION  06/22/2010   normal coronary arteries, PAF  . CATARACT EXTRACTION, BILATERAL Bilateral   . CHILD BIRTHS     X2  . DILATION AND CURETTAGE OF UTERUS    . INFUSION PUMP IMPLANTATION  ~ 2009   baclofen infusion in lower abd  . PAIN PUMP REVISION N/A 07/29/2014   Procedure: Baclofen pump replacement;  Surgeon: Erline Levine, MD;  Location: [place] [facility];  Service: Neurosurgery;  Laterality: N/A;  Baclofen pump replacement  . TONSILLECTOMY AND ADENOIDECTOMY    . VARICOSE VEIN SURGERY  ~ 1968  . VENOUS ABLATION  12/16/2010   radiofreq ablation -Dr Elisabeth Cara and [facility]  . WISDOM TOOTH EXTRACTION     Social History   Occupational History  . Occupation: RETIRED  Tobacco Use  . Smoking status: Former Smoker    Packs/day: 0.50    Years: 7.00    Pack years: 3.50    Types: Cigarettes    Last attempt to  quit: 08/21/1976    Years since quitting: 41.5  . Smokeless tobacco: Never Used  Substance and Sexual Activity  . Alcohol use: Yes    Alcohol/week: 0.6 oz    Types: 1 Glasses of wine per week  . Drug use: No  . Sexual activity: Not Currently

## 2018-02-25 DIAGNOSIS — R262 Difficulty in walking, not elsewhere classified: Secondary | ICD-10-CM | POA: Diagnosis not present

## 2018-02-25 DIAGNOSIS — M6281 Muscle weakness (generalized): Secondary | ICD-10-CM | POA: Diagnosis not present

## 2018-02-25 DIAGNOSIS — Z9181 History of falling: Secondary | ICD-10-CM | POA: Diagnosis not present

## 2018-02-25 DIAGNOSIS — M25669 Stiffness of unspecified knee, not elsewhere classified: Secondary | ICD-10-CM | POA: Diagnosis not present

## 2018-02-26 ENCOUNTER — Other Ambulatory Visit: Payer: Self-pay | Admitting: Neurology

## 2018-02-26 DIAGNOSIS — I10 Essential (primary) hypertension: Secondary | ICD-10-CM | POA: Diagnosis not present

## 2018-02-26 DIAGNOSIS — R7301 Impaired fasting glucose: Secondary | ICD-10-CM | POA: Diagnosis not present

## 2018-02-26 DIAGNOSIS — I872 Venous insufficiency (chronic) (peripheral): Secondary | ICD-10-CM | POA: Diagnosis not present

## 2018-02-26 DIAGNOSIS — M859 Disorder of bone density and structure, unspecified: Secondary | ICD-10-CM | POA: Diagnosis not present

## 2018-02-26 DIAGNOSIS — G35 Multiple sclerosis: Secondary | ICD-10-CM | POA: Diagnosis not present

## 2018-02-26 DIAGNOSIS — E668 Other obesity: Secondary | ICD-10-CM | POA: Diagnosis not present

## 2018-02-26 DIAGNOSIS — I480 Paroxysmal atrial fibrillation: Secondary | ICD-10-CM | POA: Diagnosis not present

## 2018-02-26 DIAGNOSIS — C649 Malignant neoplasm of unspecified kidney, except renal pelvis: Secondary | ICD-10-CM | POA: Insufficient documentation

## 2018-02-26 DIAGNOSIS — C642 Malignant neoplasm of left kidney, except renal pelvis: Secondary | ICD-10-CM | POA: Diagnosis not present

## 2018-02-27 ENCOUNTER — Other Ambulatory Visit: Payer: Self-pay | Admitting: Neurology

## 2018-02-27 ENCOUNTER — Telehealth: Payer: Self-pay | Admitting: Neurology

## 2018-02-27 NOTE — Telephone Encounter (Signed)
Pt request refill for ALPRAZolam Duanne Moron) 0.5 MG tablet sent to [facility].  Pt said if she could get a 90day supply to please send [facility] otherwise 30 day to [place]. Thank you

## 2018-02-27 NOTE — Telephone Encounter (Signed)
Called the patient to see if she had enough to get her through until Monday when Dr Brett Fairy returns and she does. If Dr Brett Fairy agrees we will refill and send for her on Monday. Last time she had one filled was 03/2017.

## 2018-02-28 DIAGNOSIS — R262 Difficulty in walking, not elsewhere classified: Secondary | ICD-10-CM | POA: Diagnosis not present

## 2018-02-28 DIAGNOSIS — M6281 Muscle weakness (generalized): Secondary | ICD-10-CM | POA: Diagnosis not present

## 2018-02-28 DIAGNOSIS — Z9181 History of falling: Secondary | ICD-10-CM | POA: Diagnosis not present

## 2018-02-28 DIAGNOSIS — M25669 Stiffness of unspecified knee, not elsewhere classified: Secondary | ICD-10-CM | POA: Diagnosis not present

## 2018-03-04 ENCOUNTER — Other Ambulatory Visit: Payer: Self-pay | Admitting: Neurology

## 2018-03-04 MED ORDER — ALPRAZOLAM 0.5 MG PO TABS: 0.5000 mg | ORAL_TABLET | Freq: Two times a day (BID) | ORAL | 0 refills | Status: DC | PRN

## 2018-03-05 DIAGNOSIS — Z9181 History of falling: Secondary | ICD-10-CM | POA: Diagnosis not present

## 2018-03-05 DIAGNOSIS — M6281 Muscle weakness (generalized): Secondary | ICD-10-CM | POA: Diagnosis not present

## 2018-03-05 DIAGNOSIS — R262 Difficulty in walking, not elsewhere classified: Secondary | ICD-10-CM | POA: Diagnosis not present

## 2018-03-05 DIAGNOSIS — M25669 Stiffness of unspecified knee, not elsewhere classified: Secondary | ICD-10-CM | POA: Diagnosis not present

## 2018-03-07 DIAGNOSIS — M6281 Muscle weakness (generalized): Secondary | ICD-10-CM | POA: Diagnosis not present

## 2018-03-07 DIAGNOSIS — Z9181 History of falling: Secondary | ICD-10-CM | POA: Diagnosis not present

## 2018-03-07 DIAGNOSIS — M25669 Stiffness of unspecified knee, not elsewhere classified: Secondary | ICD-10-CM | POA: Diagnosis not present

## 2018-03-07 DIAGNOSIS — R262 Difficulty in walking, not elsewhere classified: Secondary | ICD-10-CM | POA: Diagnosis not present

## 2018-03-11 ENCOUNTER — Ambulatory Visit (INDEPENDENT_AMBULATORY_CARE_PROVIDER_SITE_OTHER): Payer: Medicare Other | Admitting: Pharmacist Clinician (PhC)/ Clinical Pharmacy Specialist

## 2018-03-11 ENCOUNTER — Ambulatory Visit (INDEPENDENT_AMBULATORY_CARE_PROVIDER_SITE_OTHER): Payer: Medicare Other | Admitting: Family Medicine

## 2018-03-11 ENCOUNTER — Telehealth: Payer: Self-pay | Admitting: Neurology

## 2018-03-11 VITALS — BP 115/65 | HR 53 | Temp 98.2°F | Ht 63.0 in | Wt 209.0 lb

## 2018-03-11 DIAGNOSIS — Z7901 Long term (current) use of anticoagulants: Secondary | ICD-10-CM | POA: Diagnosis not present

## 2018-03-11 DIAGNOSIS — I482 Chronic atrial fibrillation: Secondary | ICD-10-CM

## 2018-03-11 DIAGNOSIS — Z6837 Body mass index (BMI) 37.0-37.9, adult: Secondary | ICD-10-CM

## 2018-03-11 DIAGNOSIS — I4821 Permanent atrial fibrillation: Secondary | ICD-10-CM

## 2018-03-11 DIAGNOSIS — E876 Hypokalemia: Secondary | ICD-10-CM | POA: Diagnosis not present

## 2018-03-11 LAB — POCT INR: INR: 3.3 — AB (ref 2.0–3.0)

## 2018-03-11 NOTE — Telephone Encounter (Signed)
Would you please let Mrs. Oguinn know that brain donation can go through [facility] [employer] [facility] ). Whole body donation can be arranged through [facility] - she had asked  me about this.

## 2018-03-12 DIAGNOSIS — M25669 Stiffness of unspecified knee, not elsewhere classified: Secondary | ICD-10-CM | POA: Diagnosis not present

## 2018-03-12 DIAGNOSIS — R262 Difficulty in walking, not elsewhere classified: Secondary | ICD-10-CM | POA: Diagnosis not present

## 2018-03-12 DIAGNOSIS — Z9181 History of falling: Secondary | ICD-10-CM | POA: Diagnosis not present

## 2018-03-12 DIAGNOSIS — M6281 Muscle weakness (generalized): Secondary | ICD-10-CM | POA: Diagnosis not present

## 2018-03-12 NOTE — Progress Notes (Signed)
Office: [phone]  /  Fax: [phone]   HPI:   Chief Complaint: OBESITY Nima is here to discuss her progress with her obesity treatment plan. She is on the Category 2 plan and is following her eating plan approximately 90 % of the time. She states she is doing physical therapy for 50 minutes 2 to 4 times per week. Chellie continues to do very well with the category 2 plan and her weight loss. She states hunger is controlled and her cravings have decreased. She is getting good support at home. Her weight is 209 lb (94.8 kg) today and has had a weight loss of 8 pounds over a period of 3 weeks since her last visit. She has lost 48 lbs since starting treatment with [name].  Hypokalemia Patient had an episode of hypokalemia this spring and has been well controlled on KCl but she feels this is causing GI issues and she would like to discontinue this. She stater her potassium was normal for many years while on lasix without KCl.  ALLERGIES: Allergies  Allergen Reactions  . Hydrocodone Nausea And Vomiting    Hydrocodone causes vomiting  . Oxycodone Nausea And Vomiting    Oral oxycodone causes vomiting  . Chlorhexidine Gluconate Rash    Sores at site  . Zoloft [Sertraline] Hives, Swelling and Rash    MEDICATIONS: Current Outpatient Medications on File Prior to Visit  Medication Sig Dispense Refill  . acetaminophen (TYLENOL) 500 MG tablet Take 500-1,000 mg by mouth every 6 (six) hours as needed for fever or headache (or pain).    Marland Kitchen ALPRAZolam (XANAX) 0.5 MG tablet Take 1 tablet (0.5 mg total) by mouth 2 (two) times daily as needed for anxiety. 60 tablet 0  . baclofen (LIORESAL) 20 MG tablet Take 1 tablet (20 mg total) by mouth 3 (three) times daily as needed for muscle spasms. (Patient taking differently: Take 20 mg by mouth See admin instructions. Once to twice a day for breakthrough spasms) 180 each 3  . Biotin (PA BIOTIN) 1000 MCG tablet Take 1,000 mcg by mouth daily.    Marland Kitchen  buPROPion (WELLBUTRIN XL) 300 MG 24 hr tablet Take 1 tablet (300 mg total) by mouth daily. 30 tablet 0  . CALCIUM CITRATE PO Take 2 tablets by mouth 2 (two) times daily.    . cetirizine (ZYRTEC) 10 MG tablet Take 10 mg by mouth daily.    . Cholecalciferol (VITAMIN D) 2000 units tablet Take 1 tablet (2,000 Units total) by mouth 2 (two) times daily.  0  . co-enzyme Q-10 30 MG capsule Take 30 mg by mouth daily.    . Cranberry 1000 MG CAPS Take by mouth.    . diltiazem (CARDIZEM CD) 180 MG 24 hr capsule Take 1 capsule (180 mg total) by mouth daily. 30 capsule 3  . docusate sodium (COLACE) 100 MG capsule Take 1 capsule (100 mg total) by mouth daily. 10 capsule 0  . fluticasone (FLONASE) 50 MCG/ACT nasal spray Place 2 sprays into both nostrils daily as needed for allergies.     . furosemide (LASIX) 40 MG tablet Take 2 tablets (80mg ) by mouth in the morning and 1 tablet (40mg ) in the afternoon    . gabapentin (NEURONTIN) 300 MG capsule TAKE 1 CAPSULE BY MOUTH UP  TO 4 TIMES DAILY 360 capsule 3  . HP ACTHAR 80 UNIT/ML injectable gel INJECT 80 UNITS (1ML) SUBCUTANEOUSLY EVERY OTHER DAY FOR A TOTAL OF 10 DAYS. REPEAT EVERY 30 DAYS. (DISCARD 28 DAYS  AFTER 1ST USE) 5 mL 5  . lactulose, encephalopathy, (GENERLAC) 10 GM/15ML SOLN Take 15 mLs (10 g total) by mouth 2 (two) times daily as needed. 946 mL 5  . levothyroxine (SYNTHROID, LEVOTHROID) 75 MCG tablet Take 75 mcg by mouth daily.    . Multiple Vitamins-Minerals (CENTRUM SILVER PO) Take 1 tablet by mouth daily.    . Nebivolol HCl (BYSTOLIC) 20 MG TABS Take 1 tablet (20 mg total) by mouth daily. 30 tablet   . Omega-3 Fatty Acids (FISH OIL) 1000 MG CAPS Take 1,000 mg by mouth daily.    Marland Kitchen omeprazole (PRILOSEC) 20 MG capsule Take 20 mg by mouth daily.     . psyllium (METAMUCIL SMOOTH TEXTURE) 28 % packet Take 1 packet by mouth every other day.    . ranitidine (ZANTAC) 150 MG tablet Take 150 mg by mouth at bedtime.     . simvastatin (ZOCOR) 20 MG tablet Take 10  mg by mouth at bedtime.     . SYRINGE/NEEDLE, DISP, 1 ML (BD LUER-LOK SYRINGE) 25G X 5/8" 1 ML MISC Use as directed to inject  Acthar 10 each prn  . Tamsulosin HCl (FLOMAX) 0.4 MG CAPS Take 0.4 mg by mouth daily.    Marland Kitchen UNABLE TO FIND Baclofen pump: Continuous    . warfarin (COUMADIN) 5 MG tablet TAKE 1/2 TO 1 TABLET BY  MOUTH DAILY AS DIRECTED BY  COUMADIN CLINIC. 90 tablet 1   Current Facility-Administered Medications on File Prior to Visit  Medication Dose Route Frequency Provider Last Rate Last Dose  . baclofen (LIORESAL) intrathecal injection 40 mg/35mL  40 mg Intrathecal Once Kathrynn Ducking, MD        PAST MEDICAL HISTORY: Past Medical History:  Diagnosis Date  . Abnormality of gait 03/01/2015  . Anxiety   . Arthritis    "knees" (01/19/2016)  . Atrial fibrillation with RVR ([place]) 01/19/2016  . Back pain   . Chest pain   . Childhood asthma   . Chronic pain    "nerve pain from the MS"  . Constipation   . Depression   . DVT (deep venous thrombosis) ([place]) 1983   "RLE; may have just been phlebitis; it was before the age of dopplers"  . 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 GERD (gastroesophageal reflux disease)   . History of stress test 06/21/10   limited exam with some degree of breast attenuatin of the apex however a component of apical ischemia is not excluded, EF 62%; cardiac cath   . HLD (hyperlipidemia)   . Hx of echocardiogram 04/22/10   EF 55-60%, no valve issues  . Hypertension   . Hypothyroidism   . Joint pain   . Migraine    "none since I stopped beta blocker in 1990s" (01/19/2016)  . Multiple sclerosis ([place])    has had this may 1989-[place] reg doc  . OSA on CPAP   . Osteoarthritis   . PAF (paroxysmal atrial fibrillation) ([place])    on coumadin; documented on monitor 06/2010  . Pneumonia 11/2011  . Shortness of breath   . Thyroid disease     PAST SURGICAL HISTORY: Past Surgical History:  Procedure  Laterality Date  . ANTERIOR CERVICAL DECOMP/DISCECTOMY FUSION  01/2004  . BACK SURGERY    . CARDIAC CATHETERIZATION  06/22/2010   normal coronary arteries, PAF  . CATARACT EXTRACTION, BILATERAL Bilateral   . CHILD BIRTHS  X2  . DILATION AND CURETTAGE OF UTERUS    . INFUSION PUMP IMPLANTATION  ~ 2009   baclofen infusion in lower abd  . PAIN PUMP REVISION N/A 07/29/2014   Procedure: Baclofen pump replacement;  Surgeon: Erline Levine, MD;  Location: [facility];  Service: Neurosurgery;  Laterality: N/A;  Baclofen pump replacement  . TONSILLECTOMY AND ADENOIDECTOMY    . VARICOSE VEIN SURGERY  ~ 1968  . VENOUS ABLATION  12/16/2010   radiofreq ablation -Dr Elisabeth Cara and [facility]  . WISDOM TOOTH EXTRACTION      SOCIAL HISTORY: Social History   Tobacco Use  . Smoking status: Former Smoker    Packs/day: 0.50    Years: 7.00    Pack years: 3.50    Types: Cigarettes    Last attempt to quit: 08/21/1976    Years since quitting: 41.5  . Smokeless tobacco: Never Used  Substance Use Topics  . Alcohol use: Yes    Alcohol/week: 0.6 oz    Types: 1 Glasses of wine per week  . Drug use: No    FAMILY HISTORY: Family History  Problem Relation Age of Onset  . Coronary artery disease Father        at age 64  . Hyperlipidemia Father   . Thyroid disease Father   . Coronary artery disease Maternal Grandmother   . Depression Maternal Grandmother   . Cancer Paternal Grandmother   . Depression Mother   . Sudden death Mother   . Anxiety disorder Mother   . Alcoholism Son     ROS: Review of Systems  Constitutional: Positive for weight loss.  Gastrointestinal: Positive for diarrhea, nausea and vomiting.    PHYSICAL EXAM: Blood pressure 115/65, pulse (!) 53, temperature 98.2 F (36.8 C), temperature source Oral, height 5\' 3"  (1.6 m), weight 209 lb (94.8 kg), SpO2 93 %. Body mass index is 37.02 kg/m. Physical Exam  Constitutional: She is oriented to person, place, and time. She appears  well-developed and well-nourished.  Cardiovascular: Normal rate.  Pulmonary/Chest: Effort normal.  Musculoskeletal: Normal range of motion.  Neurological: She is oriented to person, place, and time.  Skin: Skin is warm and dry.  Psychiatric: She has a normal mood and affect. Her behavior is normal.  Vitals reviewed.   RECENT LABS AND TESTS: BMET    Component Value Date/Time   NA 139 02/18/2018 1550   K 4.3 02/18/2018 1550   CL 99 02/18/2018 1550   CO2 26 02/18/2018 1550   GLUCOSE 125 (H) 02/18/2018 1550   GLUCOSE 92 07/08/2017 1214   BUN 23 02/18/2018 1550   CREATININE 0.60 02/18/2018 1550   CALCIUM 9.5 02/18/2018 1550   GFRNONAA 93 02/18/2018 1550   GFRAA 107 02/18/2018 1550   Lab Results  Component Value Date   HGBA1C 5.2 10/01/2017   HGBA1C 5.4 03/08/2017   HGBA1C  04/21/2010    5.5 (NOTE)                                                                       According to the ADA Clinical Practice Recommendations for 2011, when HbA1c is used as a screening test:   >=6.5%   Diagnostic of Diabetes Mellitus           (  if abnormal result  is confirmed)  5.7-6.4%   Increased risk of developing Diabetes Mellitus  References:Diagnosis and Classification of Diabetes Mellitus,Diabetes FXTK,2409,73(ZHGDJ 1):S62-S69 and Standards of Medical Care in         Diabetes - 2011,Diabetes MEQA,8341,96  (Suppl 1):S11-S61.   Lab Results  Component Value Date   INSULIN 20.4 11/27/2017   INSULIN 20.4 03/08/2017   CBC    Component Value Date/Time   WBC 7.0 10/01/2017   WBC 8.4 07/08/2017 1214   RBC 4.83 07/08/2017 1214   HGB 15.1 10/01/2017   HGB 14.1 03/08/2017 1152   HCT 47 (A) 10/01/2017   HCT 43.0 03/08/2017 1152   PLT 185 10/01/2017   PLT 219 12/18/2014 1157   MCV 93.2 07/08/2017 1214   MCV 93 03/08/2017 1152   MCH 31.5 07/08/2017 1214   MCHC 33.8 07/08/2017 1214   RDW 13.3 07/08/2017 1214   RDW 13.3 03/08/2017 1152   LYMPHSABS 1.6 07/08/2017 1214   LYMPHSABS 2.2 03/08/2017  1152   MONOABS 1.3 (H) 07/08/2017 1214   EOSABS 0.2 07/08/2017 1214   EOSABS 0.1 03/08/2017 1152   BASOSABS 0.0 07/08/2017 1214   BASOSABS 0.0 03/08/2017 1152   Iron/TIBC/Ferritin/ %Sat No results found for: IRON, TIBC, FERRITIN, IRONPCTSAT Lipid Panel     Component Value Date/Time   CHOL 129 11/27/2017 1011   TRIG 60 11/27/2017 1011   HDL 51 11/27/2017 1011   CHOLHDL 3.0 04/22/2010 0536   VLDL 29 04/22/2010 0536   LDLCALC 66 11/27/2017 1011   Hepatic Function Panel     Component Value Date/Time   PROT 6.1 02/18/2018 1550   ALBUMIN 4.2 02/18/2018 1550   AST 18 02/18/2018 1550   ALT 31 02/18/2018 1550   ALKPHOS 62 02/18/2018 1550   BILITOT 0.3 02/18/2018 1550      Component Value Date/Time   TSH 0.651 11/27/2017 1011   TSH 2.410 03/08/2017 1152   TSH 1.083 08/06/2016 0647   TSH 1.871 01/19/2016 2302   TSH 0.206 (L) 03/10/2011 1351   Results for Egnor, Glendia G "[name]" (MRN [id number]) as of 03/12/2018 10:49  Ref. Range 11/27/2017 10:11  Vitamin D, 25-Hydroxy Latest Ref Range: 30.0 - 100.0 ng/mL 59.1   ASSESSMENT AND PLAN: Hypokalemia  Class 2 severe obesity with serious comorbidity and body mass index (BMI) of 37.0 to 37.9 in adult, unspecified obesity type ([place])  PLAN:  Hypokalemia Sherisse will discontinue potassium and will increase potassium rich foods. Hand out was given and we will recheck potassium in 5 to 6 weeks. We may need to start Spironolactone if potassium drops again.  Obesity Chaka is currently in the action stage of change. As such, her goal is to continue with weight loss efforts She has agreed to follow the Category 2 plan Paulett has been instructed to work up to a goal of 150 minutes of combined cardio and strengthening exercise per week for weight loss and overall health benefits. We discussed the following Behavioral Modification Strategies today: no skipping meals, increasing lean protein intake and work on meal planning and easy  cooking plans  Alnita has agreed to follow up with our clinic in 3 weeks. She was informed of the importance of frequent follow up visits to maximize her success with intensive lifestyle modifications for her multiple health conditions.   OBESITY BEHAVIORAL INTERVENTION VISIT  Today's visit was # 19 out of 22.  Starting weight: 257 lbs Starting date: 03/08/17 Today's weight : 209 lbs Today's date: 03/11/2018 Total  lbs lost to date: 76    ASK: We discussed the diagnosis of obesity with [name] today and Philomene agreed to give [name] permission to discuss obesity behavioral modification therapy today.  ASSESS: Courteny has the diagnosis of obesity and her BMI today is 37.03 Nikko is in the action stage of change   ADVISE: Rehmat was educated on the multiple health risks of obesity as well as the benefit of weight loss to improve her health. She was advised of the need for long term treatment and the importance of lifestyle modifications.  AGREE: Multiple dietary modification options and treatment options were discussed and  Evely agreed to the above obesity treatment plan.  I, Doreene Nest, am acting as transcriptionist for Dennard Nip, MD  I have reviewed the above documentation for accuracy and completeness, and I agree with the above. -Dennard Nip, MD

## 2018-03-13 ENCOUNTER — Encounter: Payer: Self-pay | Admitting: Neurology

## 2018-03-14 DIAGNOSIS — Z9181 History of falling: Secondary | ICD-10-CM | POA: Diagnosis not present

## 2018-03-14 DIAGNOSIS — R262 Difficulty in walking, not elsewhere classified: Secondary | ICD-10-CM | POA: Diagnosis not present

## 2018-03-14 DIAGNOSIS — M25669 Stiffness of unspecified knee, not elsewhere classified: Secondary | ICD-10-CM | POA: Diagnosis not present

## 2018-03-14 DIAGNOSIS — M6281 Muscle weakness (generalized): Secondary | ICD-10-CM | POA: Diagnosis not present

## 2018-03-19 DIAGNOSIS — R262 Difficulty in walking, not elsewhere classified: Secondary | ICD-10-CM | POA: Diagnosis not present

## 2018-03-19 DIAGNOSIS — M6281 Muscle weakness (generalized): Secondary | ICD-10-CM | POA: Diagnosis not present

## 2018-03-19 DIAGNOSIS — Z9181 History of falling: Secondary | ICD-10-CM | POA: Diagnosis not present

## 2018-03-19 DIAGNOSIS — M25669 Stiffness of unspecified knee, not elsewhere classified: Secondary | ICD-10-CM | POA: Diagnosis not present

## 2018-03-21 DIAGNOSIS — Z9181 History of falling: Secondary | ICD-10-CM | POA: Diagnosis not present

## 2018-03-21 DIAGNOSIS — M6281 Muscle weakness (generalized): Secondary | ICD-10-CM | POA: Diagnosis not present

## 2018-03-21 DIAGNOSIS — R262 Difficulty in walking, not elsewhere classified: Secondary | ICD-10-CM | POA: Diagnosis not present

## 2018-03-21 DIAGNOSIS — M25669 Stiffness of unspecified knee, not elsewhere classified: Secondary | ICD-10-CM | POA: Diagnosis not present

## 2018-04-01 ENCOUNTER — Ambulatory Visit (INDEPENDENT_AMBULATORY_CARE_PROVIDER_SITE_OTHER): Payer: Medicare Other | Admitting: Pharmacist

## 2018-04-01 ENCOUNTER — Other Ambulatory Visit: Payer: Self-pay | Admitting: Adult Health

## 2018-04-01 ENCOUNTER — Ambulatory Visit (INDEPENDENT_AMBULATORY_CARE_PROVIDER_SITE_OTHER): Payer: Medicare Other | Admitting: Family Medicine

## 2018-04-01 VITALS — BP 121/69 | HR 65 | Temp 98.7°F | Ht 63.0 in | Wt 207.0 lb

## 2018-04-01 DIAGNOSIS — Z9181 History of falling: Secondary | ICD-10-CM | POA: Diagnosis not present

## 2018-04-01 DIAGNOSIS — K5909 Other constipation: Secondary | ICD-10-CM | POA: Diagnosis not present

## 2018-04-01 DIAGNOSIS — Z7901 Long term (current) use of anticoagulants: Secondary | ICD-10-CM

## 2018-04-01 DIAGNOSIS — R262 Difficulty in walking, not elsewhere classified: Secondary | ICD-10-CM | POA: Diagnosis not present

## 2018-04-01 DIAGNOSIS — Z6836 Body mass index (BMI) 36.0-36.9, adult: Secondary | ICD-10-CM

## 2018-04-01 DIAGNOSIS — I482 Chronic atrial fibrillation: Secondary | ICD-10-CM

## 2018-04-01 DIAGNOSIS — I4821 Permanent atrial fibrillation: Secondary | ICD-10-CM

## 2018-04-01 DIAGNOSIS — F3289 Other specified depressive episodes: Secondary | ICD-10-CM

## 2018-04-01 DIAGNOSIS — M25669 Stiffness of unspecified knee, not elsewhere classified: Secondary | ICD-10-CM | POA: Diagnosis not present

## 2018-04-01 DIAGNOSIS — M6281 Muscle weakness (generalized): Secondary | ICD-10-CM | POA: Diagnosis not present

## 2018-04-01 LAB — POCT INR: INR: 2.3 (ref 2.0–3.0)

## 2018-04-01 NOTE — Progress Notes (Signed)
Office: [phone]  /  Fax: [phone]   HPI:   Chief Complaint: OBESITY Toni [name] is here to discuss her progress with her obesity treatment plan. She is on the Category 2 plan and is following her eating plan approximately 95 % of the time. She states she is doing physical therapy for 15-60 minutes 2-5 times per week. Toni [name] continues to do well with weight loss, hunger is mostly controlled and she is getting good family support.  Her weight is 207 lb (93.9 kg) today and has had a weight loss of 2 pounds over a period of 3 weeks since her last visit. She has lost 50 lbs since starting treatment with [name].  Chronic Constipation Toni [name] is on lactulose and stool softener, still struggling due to multiple sclerosis.  Depression with emotional eating behaviors Toni [name] is struggling with night time eating, especially between 9 and 11 pm. She knows it is emotional eating but can't seem to change her behavior. Toni [name] struggles with emotional eating and using food for comfort to the extent that it is negatively impacting her health. She often snacks when she is not hungry. Toni [name] sometimes feels she is out of control and then feels guilty that she made poor food choices. She has been working on behavior modification techniques to help reduce her emotional eating and has been somewhat successful. She shows no sign of suicidal or homicidal ideations.  Depression screen PHQ 2/9 03/08/2017  Decreased Interest 0  Down, Depressed, Hopeless 1  PHQ - 2 Score 1  Altered sleeping 2  Tired, decreased energy 2  Change in appetite 1  Feeling bad or failure about yourself  0  Trouble concentrating 0  Moving slowly or fidgety/restless 0  Suicidal thoughts 0  PHQ-9 Score 6  Some recent data might be hidden    ALLERGIES: Allergies  Allergen Reactions  . Hydrocodone Nausea And Vomiting    Hydrocodone causes vomiting  . Oxycodone Nausea And Vomiting    Oral oxycodone causes vomiting  .  Chlorhexidine Gluconate Rash    Sores at site  . Zoloft [Sertraline] Hives, Swelling and Rash    MEDICATIONS: Current Outpatient Medications on File Prior to Visit  Medication Sig Dispense Refill  . acetaminophen (TYLENOL) 500 MG tablet Take 500-1,000 mg by mouth every 6 (six) hours as needed for fever or headache (or pain).    Marland Kitchen ALPRAZolam (XANAX) 0.5 MG tablet Take 1 tablet (0.5 mg total) by mouth 2 (two) times daily as needed for anxiety. 60 tablet 0  . baclofen (LIORESAL) 20 MG tablet Take 1 tablet (20 mg total) by mouth 3 (three) times daily as needed for muscle spasms. (Patient taking differently: Take 20 mg by mouth See admin instructions. Once to twice a day for breakthrough spasms) 180 each 3  . Biotin (PA BIOTIN) 1000 MCG tablet Take 1,000 mcg by mouth daily.    Marland Kitchen buPROPion (WELLBUTRIN XL) 300 MG 24 hr tablet Take 1 tablet (300 mg total) by mouth daily. 30 tablet 0  . CALCIUM CITRATE PO Take 2 tablets by mouth 2 (two) times daily.    . cephALEXin (KEFLEX) 500 MG capsule Take 500 mg by mouth 3 (three) times daily.    . cetirizine (ZYRTEC) 10 MG tablet Take 10 mg by mouth daily.    . Cholecalciferol (VITAMIN D) 2000 units tablet Take 1 tablet (2,000 Units total) by mouth 2 (two) times daily.  0  . co-enzyme Q-10 30 MG capsule Take 30 mg by mouth daily.    Marland Kitchen  Cranberry 1000 MG CAPS Take by mouth.    . diltiazem (CARDIZEM CD) 180 MG 24 hr capsule Take 1 capsule (180 mg total) by mouth daily. 30 capsule 3  . docusate sodium (COLACE) 100 MG capsule Take 1 capsule (100 mg total) by mouth daily. 10 capsule 0  . fluticasone (FLONASE) 50 MCG/ACT nasal spray Place 2 sprays into both nostrils daily as needed for allergies.     . furosemide (LASIX) 40 MG tablet Take 2 tablets (80mg ) by mouth in the morning and 1 tablet (40mg ) in the afternoon    . gabapentin (NEURONTIN) 300 MG capsule TAKE 1 CAPSULE BY MOUTH UP  TO 4 TIMES DAILY 360 capsule 3  . HP ACTHAR 80 UNIT/ML injectable gel INJECT 80 UNITS  (1ML) SUBCUTANEOUSLY EVERY OTHER DAY FOR A TOTAL OF 10 DAYS. REPEAT EVERY 30 DAYS. (DISCARD 28 DAYS AFTER 1ST USE) 5 mL 5  . levothyroxine (SYNTHROID, LEVOTHROID) 75 MCG tablet Take 75 mcg by mouth daily.    . Multiple Vitamins-Minerals (CENTRUM SILVER PO) Take 1 tablet by mouth daily.    . Nebivolol HCl (BYSTOLIC) 20 MG TABS Take 1 tablet (20 mg total) by mouth daily. 30 tablet   . Omega-3 Fatty Acids (FISH OIL) 1000 MG CAPS Take 1,000 mg by mouth daily.    Marland Kitchen omeprazole (PRILOSEC) 20 MG capsule Take 20 mg by mouth daily.     . psyllium (METAMUCIL SMOOTH TEXTURE) 28 % packet Take 1 packet by mouth every other day.    . ranitidine (ZANTAC) 150 MG tablet Take 150 mg by mouth at bedtime.     . simvastatin (ZOCOR) 20 MG tablet Take 10 mg by mouth at bedtime.     . SYRINGE/NEEDLE, DISP, 1 ML (BD LUER-LOK SYRINGE) 25G X 5/8" 1 ML MISC Use as directed to inject  Acthar 10 each prn  . Tamsulosin HCl (FLOMAX) 0.4 MG CAPS Take 0.4 mg by mouth daily.    Marland Kitchen UNABLE TO FIND Baclofen pump: Continuous    . warfarin (COUMADIN) 5 MG tablet TAKE 1/2 TO 1 TABLET BY  MOUTH DAILY AS DIRECTED BY  COUMADIN CLINIC. 90 tablet 1   Current Facility-Administered Medications on File Prior to Visit  Medication Dose Route Frequency Provider Last Rate Last Dose  . baclofen (LIORESAL) intrathecal injection 40 mg/61mL  40 mg Intrathecal Once Kathrynn Ducking, MD        PAST MEDICAL HISTORY: Past Medical History:  Diagnosis Date  . Abnormality of gait 03/01/2015  . Anxiety   . Arthritis    "knees" (01/19/2016)  . Atrial fibrillation with RVR ([place]) 01/19/2016  . Back pain   . Chest pain   . Childhood asthma   . Chronic pain    "nerve pain from the MS"  . Constipation   . Depression   . DVT (deep venous thrombosis) ([place]) 1983   "RLE; may have just been phlebitis; it was before the age of dopplers"  . 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 GERD (gastroesophageal  reflux disease)   . History of stress test 06/21/10   limited exam with some degree of breast attenuatin of the apex however a component of apical ischemia is not excluded, EF 62%; cardiac cath   . HLD (hyperlipidemia)   . Hx of echocardiogram 04/22/10   EF 55-60%, no valve issues  . Hypertension   . Hypothyroidism   . Joint pain   .  Migraine    "none since I stopped beta blocker in 1990s" (01/19/2016)  . Multiple sclerosis ([place])    has had this may 1989-[facility] reg doc  . OSA on CPAP   . Osteoarthritis   . PAF (paroxysmal atrial fibrillation) ([place])    on coumadin; documented on monitor 06/2010  . Pneumonia 11/2011  . Shortness of breath   . Thyroid disease     PAST SURGICAL HISTORY: Past Surgical History:  Procedure Laterality Date  . ANTERIOR CERVICAL DECOMP/DISCECTOMY FUSION  01/2004  . BACK SURGERY    . CARDIAC CATHETERIZATION  06/22/2010   normal coronary arteries, PAF  . CATARACT EXTRACTION, BILATERAL Bilateral   . CHILD BIRTHS     X2  . DILATION AND CURETTAGE OF UTERUS    . INFUSION PUMP IMPLANTATION  ~ 2009   baclofen infusion in lower abd  . PAIN PUMP REVISION N/A 07/29/2014   Procedure: Baclofen pump replacement;  Surgeon: Erline Levine, MD;  Location: [facility];  Service: Neurosurgery;  Laterality: N/A;  Baclofen pump replacement  . TONSILLECTOMY AND ADENOIDECTOMY    . VARICOSE VEIN SURGERY  ~ 1968  . VENOUS ABLATION  12/16/2010   radiofreq ablation -Dr Elisabeth Cara and [facility]  . WISDOM TOOTH EXTRACTION      SOCIAL HISTORY: Social History   Tobacco Use  . Smoking status: Former Smoker    Packs/day: 0.50    Years: 7.00    Pack years: 3.50    Types: Cigarettes    Last attempt to quit: 08/21/1976    Years since quitting: 41.6  . Smokeless tobacco: Never Used  Substance Use Topics  . Alcohol use: Yes    Alcohol/week: 1.0 standard drinks    Types: 1 Glasses of wine per week  . Drug use: No    FAMILY HISTORY: Family History  Problem Relation Age of Onset  .  Coronary artery disease Father        at age 23  . Hyperlipidemia Father   . Thyroid disease Father   . Coronary artery disease Maternal Grandmother   . Depression Maternal Grandmother   . Cancer Paternal Grandmother   . Depression Mother   . Sudden death Mother   . Anxiety disorder Mother   . Alcoholism Son     ROS: Review of Systems  Constitutional: Positive for weight loss.  Gastrointestinal: Positive for constipation.  Psychiatric/Behavioral: Positive for depression. Negative for suicidal ideas.    PHYSICAL EXAM: Blood pressure 121/69, pulse 65, temperature 98.7 F (37.1 C), temperature source Oral, height 5\' 3"  (1.6 m), weight 207 lb (93.9 kg), SpO2 97 %. Body mass index is 36.67 kg/m. Physical Exam  Constitutional: She is oriented to person, place, and time. She appears well-developed and well-nourished.  Cardiovascular: Normal rate.  Pulmonary/Chest: Effort normal.  Musculoskeletal: Normal range of motion.  Neurological: She is oriented to person, place, and time.  Skin: Skin is warm and dry.  Psychiatric: She has a normal mood and affect. Her behavior is normal.  Vitals reviewed.   RECENT LABS AND TESTS: BMET    Component Value Date/Time   NA 139 02/18/2018 1550   K 4.3 02/18/2018 1550   CL 99 02/18/2018 1550   CO2 26 02/18/2018 1550   GLUCOSE 125 (H) 02/18/2018 1550   GLUCOSE 92 07/08/2017 1214   BUN 23 02/18/2018 1550   CREATININE 0.60 02/18/2018 1550   CALCIUM 9.5 02/18/2018 1550   GFRNONAA 93 02/18/2018 1550   GFRAA 107 02/18/2018 1550  Lab Results  Component Value Date   HGBA1C 5.2 10/01/2017   HGBA1C 5.4 03/08/2017   HGBA1C  04/21/2010    5.5 (NOTE)                                                                       According to the ADA Clinical Practice Recommendations for 2011, when HbA1c is used as a screening test:   >=6.5%   Diagnostic of Diabetes Mellitus           (if abnormal result  is confirmed)  5.7-6.4%   Increased risk of  developing Diabetes Mellitus  References:Diagnosis and Classification of Diabetes Mellitus,Diabetes TKWI,0973,53(GDJME 1):S62-S69 and Standards of Medical Care in         Diabetes - 2011,Diabetes Care,2011,34  (Suppl 1):S11-S61.   Lab Results  Component Value Date   INSULIN 20.4 11/27/2017   INSULIN 20.4 03/08/2017   CBC    Component Value Date/Time   WBC 7.0 10/01/2017   WBC 8.4 07/08/2017 1214   RBC 4.83 07/08/2017 1214   HGB 15.1 10/01/2017   HGB 14.1 03/08/2017 1152   HCT 47 (A) 10/01/2017   HCT 43.0 03/08/2017 1152   PLT 185 10/01/2017   PLT 219 12/18/2014 1157   MCV 93.2 07/08/2017 1214   MCV 93 03/08/2017 1152   MCH 31.5 07/08/2017 1214   MCHC 33.8 07/08/2017 1214   RDW 13.3 07/08/2017 1214   RDW 13.3 03/08/2017 1152   LYMPHSABS 1.6 07/08/2017 1214   LYMPHSABS 2.2 03/08/2017 1152   MONOABS 1.3 (H) 07/08/2017 1214   EOSABS 0.2 07/08/2017 1214   EOSABS 0.1 03/08/2017 1152   BASOSABS 0.0 07/08/2017 1214   BASOSABS 0.0 03/08/2017 1152   Iron/TIBC/Ferritin/ %Sat No results found for: IRON, TIBC, FERRITIN, IRONPCTSAT Lipid Panel     Component Value Date/Time   CHOL 129 11/27/2017 1011   TRIG 60 11/27/2017 1011   HDL 51 11/27/2017 1011   CHOLHDL 3.0 04/22/2010 0536   VLDL 29 04/22/2010 0536   LDLCALC 66 11/27/2017 1011   Hepatic Function Panel     Component Value Date/Time   PROT 6.1 02/18/2018 1550   ALBUMIN 4.2 02/18/2018 1550   AST 18 02/18/2018 1550   ALT 31 02/18/2018 1550   ALKPHOS 62 02/18/2018 1550   BILITOT 0.3 02/18/2018 1550      Component Value Date/Time   TSH 0.651 11/27/2017 1011   TSH 2.410 03/08/2017 1152   TSH 1.083 08/06/2016 0647   TSH 1.871 01/19/2016 2302   TSH 0.206 (L) 03/10/2011 1351    ASSESSMENT AND PLAN: Chronic constipation  Other depression - with emotional eating  Class 2 severe obesity with serious comorbidity and body mass index (BMI) of 36.0 to 36.9 in adult, unspecified obesity type (HCC)  PLAN:  Chronic  Constipation Toni [name] was informed decrease bowel movement frequency is normal while losing weight, but stools should not be hard or painful. She was advised to increase her [name] intake and work on increasing her fiber intake. High fiber foods were discussed today. Ok to take OTC metamucil as needed and Toni [name] agrees to follow up with our clinic in 2 to 3 weeks with myself and Dr. Mallie Mussel, our bariatric psychologist.  Depression with Emotional Eating Behaviors  We discussed behavior modification techniques today to help Toni [name] deal with her emotional eating and depression. We will refer to Dr. Mallie Mussel for evaluation and treatment. Neela agrees to follow up with our clinic in 2 to 3 weeks with myself and Dr. Mallie Mussel, our bariatric psychologist.  Obesity Toni [name] is currently in the action stage of change. As such, her goal is to continue with weight loss efforts She has agreed to follow the Category 2 plan, ok to increase berries to 1 cup daily Toni [name] has been instructed to work up to a goal of 150 minutes of combined cardio and strengthening exercise per week for weight loss and overall health benefits. We discussed the following Behavioral Modification Strategies today: increasing lean protein intake, decreasing simple carbohydrates  and work on meal planning and easy cooking plans   Toni [name] has agreed to follow up with our clinic in 2 to 3 weeks with myself and Dr. Mallie Mussel, our bariatric psychologist. She was informed of the importance of frequent follow up visits to maximize her success with intensive lifestyle modifications for her multiple health conditions.   OBESITY BEHAVIORAL INTERVENTION VISIT  Today's visit was # 20 out of 22.  Starting weight: 257 lbs Starting date: 03/08/17 Today's weight : 207 lbs Today's date: 04/01/2018 Total lbs lost to date: 47    ASK: We discussed the diagnosis of obesity with Toni [name] today and Toni [name] agreed to give [name] permission  to discuss obesity behavioral modification therapy today.  ASSESS: Toni [name] has the diagnosis of obesity and her BMI today is 36.68 Toni [name] is in the action stage of change   ADVISE: Toni [name] was educated on the multiple health risks of obesity as well as the benefit of weight loss to improve her health. She was advised of the need for long term treatment and the importance of lifestyle modifications.  AGREE: Multiple dietary modification options and treatment options were discussed and  Toni [name] agreed to the above obesity treatment plan.  I, Trixie Dredge, am acting as transcriptionist for Dennard Nip, MD  I have reviewed the above documentation for accuracy and completeness, and I agree with the above. -Dennard Nip, MD

## 2018-04-01 NOTE — Telephone Encounter (Signed)
[facility] refill submitted to [facility]. MB RN.

## 2018-04-02 DIAGNOSIS — D225 Melanocytic nevi of trunk: Secondary | ICD-10-CM | POA: Diagnosis not present

## 2018-04-02 DIAGNOSIS — L308 Other specified dermatitis: Secondary | ICD-10-CM | POA: Diagnosis not present

## 2018-04-02 DIAGNOSIS — L814 Other melanin hyperpigmentation: Secondary | ICD-10-CM | POA: Diagnosis not present

## 2018-04-02 DIAGNOSIS — L820 Inflamed seborrheic keratosis: Secondary | ICD-10-CM | POA: Diagnosis not present

## 2018-04-02 DIAGNOSIS — D485 Neoplasm of uncertain behavior of skin: Secondary | ICD-10-CM | POA: Diagnosis not present

## 2018-04-02 DIAGNOSIS — L989 Disorder of the skin and subcutaneous tissue, unspecified: Secondary | ICD-10-CM | POA: Diagnosis not present

## 2018-04-02 DIAGNOSIS — D1801 Hemangioma of skin and subcutaneous tissue: Secondary | ICD-10-CM | POA: Diagnosis not present

## 2018-04-04 DIAGNOSIS — M6281 Muscle weakness (generalized): Secondary | ICD-10-CM | POA: Diagnosis not present

## 2018-04-04 DIAGNOSIS — Z9181 History of falling: Secondary | ICD-10-CM | POA: Diagnosis not present

## 2018-04-04 DIAGNOSIS — M25669 Stiffness of unspecified knee, not elsewhere classified: Secondary | ICD-10-CM | POA: Diagnosis not present

## 2018-04-04 DIAGNOSIS — R262 Difficulty in walking, not elsewhere classified: Secondary | ICD-10-CM | POA: Diagnosis not present

## 2018-04-09 DIAGNOSIS — Z9181 History of falling: Secondary | ICD-10-CM | POA: Diagnosis not present

## 2018-04-09 DIAGNOSIS — M25669 Stiffness of unspecified knee, not elsewhere classified: Secondary | ICD-10-CM | POA: Diagnosis not present

## 2018-04-09 DIAGNOSIS — R262 Difficulty in walking, not elsewhere classified: Secondary | ICD-10-CM | POA: Diagnosis not present

## 2018-04-09 DIAGNOSIS — M6281 Muscle weakness (generalized): Secondary | ICD-10-CM | POA: Diagnosis not present

## 2018-04-10 DIAGNOSIS — Z9181 History of falling: Secondary | ICD-10-CM | POA: Diagnosis not present

## 2018-04-10 DIAGNOSIS — R262 Difficulty in walking, not elsewhere classified: Secondary | ICD-10-CM | POA: Diagnosis not present

## 2018-04-10 DIAGNOSIS — M25669 Stiffness of unspecified knee, not elsewhere classified: Secondary | ICD-10-CM | POA: Diagnosis not present

## 2018-04-10 DIAGNOSIS — M6281 Muscle weakness (generalized): Secondary | ICD-10-CM | POA: Diagnosis not present

## 2018-04-11 DIAGNOSIS — R262 Difficulty in walking, not elsewhere classified: Secondary | ICD-10-CM | POA: Diagnosis not present

## 2018-04-11 DIAGNOSIS — M6281 Muscle weakness (generalized): Secondary | ICD-10-CM | POA: Diagnosis not present

## 2018-04-11 DIAGNOSIS — M25669 Stiffness of unspecified knee, not elsewhere classified: Secondary | ICD-10-CM | POA: Diagnosis not present

## 2018-04-11 DIAGNOSIS — Z9181 History of falling: Secondary | ICD-10-CM | POA: Diagnosis not present

## 2018-04-15 ENCOUNTER — Encounter: Payer: Self-pay | Admitting: Neurology

## 2018-04-16 DIAGNOSIS — Z9181 History of falling: Secondary | ICD-10-CM | POA: Diagnosis not present

## 2018-04-16 DIAGNOSIS — M25669 Stiffness of unspecified knee, not elsewhere classified: Secondary | ICD-10-CM | POA: Diagnosis not present

## 2018-04-16 DIAGNOSIS — R262 Difficulty in walking, not elsewhere classified: Secondary | ICD-10-CM | POA: Diagnosis not present

## 2018-04-16 DIAGNOSIS — M6281 Muscle weakness (generalized): Secondary | ICD-10-CM | POA: Diagnosis not present

## 2018-04-17 NOTE — Progress Notes (Signed)
Office: [phone]  /  Fax: [phone]   Date: April 23, 2018  Time Seen: 9:00am Duration: 60 minutes Provider: Glennie Isle, PsyD Type of Session: Intake for Individual Therapy   Informed Consent: It is important to note, during today's appointment [name] informed this provider her husband, Mr. Latania Bascomb, is her power of attorney. However, no documentation is on file at this time per front desk staff. [name] and Mr. Youngman agreed to bring in legal documentation to their next appointment to be included in [facility] medical records. The following information was shared with both [name] and her husband after Faith verbally agreed to this provider speaking with her husband. The provider's role was explained and issues of confidentiality, privacy, and limits therein; anticipated course of treatment; potential risks and benefits involved with psychotherapy; the voluntary nature of treatment; and the clinic's cancellation policy were discussed. The provider also discussed billing, as it relates to insurance and the patient's responsibility. In addition to written consent by Mr. Donnis Pecha [facility] husband and power of attorney), verbal informed assent for psychological services was obtained from Clinton prior to the initial intake interview. Regarding sharing information with Mr. Shanielle, Correll noted, "You can tell him anything."   They were informed that information about mental health appointments will be entered in the medical record at [facility]) via Epic. Moreover, they agreed information may be shared with other [facility] providers as needed for coordination of care. Written consent was also provided for this provider to coordinate care with other providers at [facility].The provider further explained leaving voicemail messages and sending messages via MyChart can be utilized for non-emergency reasons,  and limits of confidentiality related to communication via technology was discussed. Furthermore, they were informed the clinic is not a 24/7 crisis center and mental health emergency resources were shared. Daysha was given a handout with emergency resources. They verbally acknowledged understanding, and agreed to use mental health emergency resources discussed if needed.   Chief Complaint: Salvador was referred by Dr. Dennard Nip due to depression with emotional eating behaviors. Per the note for the office visit with Dr. Dennard Nip on April 01, 2018, "Minka is struggling with night time eating, especially between 9 and 11 pm. She knows it is emotional eating but can't seem to change her behavior. Konica struggles with emotional eating and using food for comfort to the extent that it is negatively impacting her health. She often snacks when she is not hungry. Julicia sometimes feels she is out of control and then feels guilty that she made poor food choices. She has been working on behavior modification techniques to help reduce her emotional eating and has been somewhat successful. She shows no sign of suicidal or homicidal ideations." During today's appointment, [name] shared, "I have real problems in the evening and I don't know why. I don't know if it is physiological or psychological." She described she can go long periods during the day and not eat, but experiences difficulty in the evening. The aforementioned has been going on Endya's "whole life." She noted she cannot recall a time where she was "not heavy." Lenette denied a history of binge eating, purging behaviors, and other compensatory behaviors. She denied a history of a diagnosis of an eating disorder.   Myya was asked to complete a questionnaire assessing various behaviors related to emotional eating. Meryem endorsed the following: experience food cravings on a regular basis, use food to help  you cope with  emotional situations, overeat when you are worried about something, overeat frequently when you are bored or lonely and overeat when you are alone, but eat much less when you are with other people.  HPI: Per the note for the initial visit with Dr. Dennard Nip on March 08, 2017, "Joselynne has struggled with obesity for years and has been unsuccessful in either losing weight or maintaining long term weight loss." She shared she has been heavy most of her life and started gaining weight in her 68s. Her heaviest weight ever was 301 pounds. During that initial appointment, she also endorsed the following: significant food cravings issues; snacking frequently in the evenings; skipping meals frequently; frequently drinking liquids with calories; problems with excessive hunger; frequently eating larger portions than normal; binge eating behaviors; and emotional eating.   Mental Status Examination: Tyronica arrived early for the appointment with her husband; however, he was not present for the entire duration of today's appointment. She presented as appropriately dressed and groomed. Wilma appeared her stated age and demonstrated adequate orientation to time, place, person, and purpose of the appointment. She also demonstrated appropriate eye contact. No behavioral peculiarities noted. Toria ambulated with a wheelchair. Her mood was euthymic with congruent affect. Her thought processes were logical, linear, and goal-directed. No hallucinations, delusions, bizarre thinking or behavior reported or observed. Judgment, insight, and impulse control appeared to be grossly intact. There was no evidence of paraphasias (i.e., errors in speech, gross mispronunciations, and word substitutions), repetition deficits, or disturbances in volume or prosody (i.e., rhythm and intonation). There was no evidence of attention or memory impairments. Veleria denied current suicidal and homicidal ideation, plan, and intent.   The  Montreal Cognitive Assessment (MoCA) was administered. The MoCA assesses different cognitive domains: attention and concentration, executive functions, memory, language, visuoconstructional skills, conceptual thinking, calculations, and orientation. Bridgid received 30 out of 30 points possible on the MoCA. It is important to note that for the replication of a visual stimuli task, Jacoba verbalized difficulty holding the pen and attributed this to multiple sclerosis. Despite the drawing being at a slight angle, a point was allocated given the aforementioned difficulty.   Family & Psychosocial History:  Somer reported she has been married for 8 years and has two children (ages 31 and 13). She reported she is not currently employed and has been disabled since 1996. 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 She indicated taking a "couple graduate" level courses. Eliska indicated she previously planned to attend law school; however, she married and was pregnant the same year. She shared her current support system consists of her husband, daughter, brother, and "core" friends. She indicated she plays Bridge once a week; therefore, she has "ladies that come to the house." Griffin reported she was previously part of a spiritual group, and while they do not meet, they are still friends. 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   Medical History:  Past Medical History:  Diagnosis Date  . Abnormality of gait 03/01/2015  . Anxiety   . Arthritis    "knees" (01/19/2016)  . Atrial fibrillation with RVR ([place]) 01/19/2016  . Back pain   . Chest pain   . Childhood asthma   . Chronic pain    "nerve pain from the MS"  . Constipation   . Depression   . DVT (deep venous thrombosis) ([place]) 1983   "RLE; may have just been phlebitis; it was before the age of dopplers"  .  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 GERD  (gastroesophageal reflux disease)   . History of stress test 06/21/10   limited exam with some degree of breast attenuatin of the apex however a component of apical ischemia is not excluded, EF 62%; cardiac cath   . HLD (hyperlipidemia)   . Hx of echocardiogram 04/22/10   EF 55-60%, no valve issues  . Hypertension   . Hypothyroidism   . Joint pain   . Migraine    "none since I stopped beta blocker in 1990s" (01/19/2016)  . Multiple sclerosis ([place])    has had this may 1989-[place] reg doc  . OSA on CPAP   . Osteoarthritis   . PAF (paroxysmal atrial fibrillation) ([place])    on coumadin; documented on monitor 06/2010  . Pneumonia 11/2011  . Shortness of breath   . Thyroid disease    Past Surgical History:  Procedure Laterality Date  . ANTERIOR CERVICAL DECOMP/DISCECTOMY FUSION  01/2004  . BACK SURGERY    . CARDIAC CATHETERIZATION  06/22/2010   normal coronary arteries, PAF  . CATARACT EXTRACTION, BILATERAL Bilateral   . CHILD BIRTHS     X2  . DILATION AND CURETTAGE OF UTERUS    . INFUSION PUMP IMPLANTATION  ~ 2009   baclofen infusion in lower abd  . PAIN PUMP REVISION N/A 07/29/2014   Procedure: Baclofen pump replacement;  Surgeon: Erline Levine, MD;  Location: [place] [facility];  Service: Neurosurgery;  Laterality: N/A;  Baclofen pump replacement  . TONSILLECTOMY AND ADENOIDECTOMY    . VARICOSE VEIN SURGERY  ~ 1968  . VENOUS ABLATION  12/16/2010   radiofreq ablation -Dr Elisabeth Cara and [facility]  . WISDOM TOOTH EXTRACTION     Current Outpatient Medications on File Prior to Visit  Medication Sig Dispense Refill  . acetaminophen (TYLENOL) 500 MG tablet Take 500-1,000 mg by mouth every 6 (six) hours as needed for fever or headache (or pain).    Marland Kitchen ALPRAZolam (XANAX) 0.5 MG tablet Take 1 tablet (0.5 mg total) by mouth 2 (two) times daily as needed for anxiety. 60 tablet 0  . baclofen (LIORESAL) 20 MG tablet Take 1 tablet (20 mg total) by mouth 3 (three) times daily as needed for muscle spasms. (Patient  taking differently: Take 20 mg by mouth See admin instructions. Once to twice a day for breakthrough spasms) 180 each 3  . Biotin (PA BIOTIN) 1000 MCG tablet Take 1,000 mcg by mouth daily.    Marland Kitchen buPROPion (WELLBUTRIN XL) 300 MG 24 hr tablet Take 1 tablet (300 mg total) by mouth daily. 30 tablet 0  . CALCIUM CITRATE PO Take 2 tablets by mouth 2 (two) times daily.    . cephALEXin (KEFLEX) 500 MG capsule Take 500 mg by mouth 3 (three) times daily.    . cetirizine (ZYRTEC) 10 MG tablet Take 10 mg by mouth daily.    . Cholecalciferol (VITAMIN D) 2000 units tablet Take 1 tablet (2,000 Units total) by mouth 2 (two) times daily.  0  . co-enzyme Q-10 30 MG capsule Take 30 mg by mouth daily.    . Cranberry 1000 MG CAPS Take by mouth.    . diltiazem (CARDIZEM CD) 180 MG 24 hr capsule TAKE 1 CAPSULE(180 MG) BY MOUTH DAILY 90 capsule 0  . docusate sodium (COLACE) 100 MG capsule Take 1 capsule (100 mg total) by mouth daily. 10 capsule 0  . fluticasone (  FLONASE) 50 MCG/ACT nasal spray Place 2 sprays into both nostrils daily as needed for allergies.     . furosemide (LASIX) 40 MG tablet Take 2 tablets (80mg ) by mouth in the morning and 1 tablet (40mg ) in the afternoon    . gabapentin (NEURONTIN) 300 MG capsule TAKE 1 CAPSULE BY MOUTH UP  TO 4 TIMES DAILY 360 capsule 3  . GENERLAC 10 GM/15ML SOLN TAKE 15 MLS BY MOUTH TWICE DAILY AS NEEDED 946 mL 0  . HP ACTHAR 80 UNIT/ML injectable gel INJECT 80 UNITS (1ML) SUBCUTANEOUSLY EVERY OTHER DAY FOR A TOTAL OF 10 DAYS. REPEAT EVERY 30 DAYS. (DISCARD 28 DAYS AFTER 1ST USE) 5 mL 5  . levothyroxine (SYNTHROID, LEVOTHROID) 75 MCG tablet Take 75 mcg by mouth daily.    . Multiple Vitamins-Minerals (CENTRUM SILVER PO) Take 1 tablet by mouth daily.    . Nebivolol HCl (BYSTOLIC) 20 MG TABS Take 1 tablet (20 mg total) by mouth daily. 30 tablet   . Omega-3 Fatty Acids (FISH OIL) 1000 MG CAPS Take 1,000 mg by mouth daily.    Marland Kitchen omeprazole (PRILOSEC) 20 MG capsule Take 20 mg by mouth  daily.     . psyllium (METAMUCIL SMOOTH TEXTURE) 28 % packet Take 1 packet by mouth every other day.    . ranitidine (ZANTAC) 150 MG tablet Take 150 mg by mouth at bedtime.     . simvastatin (ZOCOR) 20 MG tablet Take 10 mg by mouth at bedtime.     . SYRINGE/NEEDLE, DISP, 1 ML (BD LUER-LOK SYRINGE) 25G X 5/8" 1 ML MISC Use as directed to inject  Acthar 10 each prn  . Tamsulosin HCl (FLOMAX) 0.4 MG CAPS Take 0.4 mg by mouth daily.    Marland Kitchen UNABLE TO FIND Baclofen pump: Continuous    . warfarin (COUMADIN) 5 MG tablet Take 1 tablet by mouth daily as directed by coumadin clinic. 90 tablet 1   Current Facility-Administered Medications on File Prior to Visit  Medication Dose Route Frequency Provider Last Rate Last Dose  . baclofen (LIORESAL) intrathecal injection 40 mg/42mL  40 mg Intrathecal Once Kathrynn Ducking, MD      Dexter denied a history of head injuries and loss of consciousness. Of note, she shared she was diagnosed with multiple sclerosis in 1989.  Mental Health History: Terree first received therapeutic services around 1997 for four to five years. She explained it was following the "unexpected" death of both her parents. Allysia denied a history of hospitalization for psychiatric reasons. She noted she has never seen a psychiatrist. Eryn reported she has a "few" Xanax at home and it was prescribed by her neurologist. She cannot recall the last time it was refilled. Moreover, Naydeen shared she takes Wellbutrin, which was prescribed by her primary care physician. She added, "I've been on it for a long time." Anaika noted she believes it is helpful. Previously, Samanth shared she took Paxil for a "couple years," but was switched to Wellbutrin. In addition, she was also previously prescribed Zoloft, but she experienced an allergic reaction; therefore, discontinued use. Saaya reported her mother had depression. She denied a trauma history, including sexual, physical, and  psychological abuse, as well as neglect.   Salle reported experiencing the following: decreased self-esteem; fatigue; difficulty staying asleep; and trouble relaxing. She denied the following: attention and concentration issues; obsessions and compulsions; mania; history of and current engagement in self-harm; hallucinations and delusions; substance use; current suicidal ideation, plan, and intent; and history of and current  homicidal ideation, plan, and intent. Regarding suicidal ideation, Jrue reported experiencing suicidal ideation approximately five years ago during which she reported experiencing an "exacerbation" of multiple scelorosis. She explained she was "down about it and was thinking about how it was affecting other people." During that time, Kasmira verbalized to her husband, "I know where the Xanax is and I don't want you to live like this." However, Leinaala denied having a plan and intent. She indicated it was the only time she experienced suicidal ideation and it was secondary to her concern of how it was impacting her husband's life. Nevertheless, during today's appointment [name] noted, "I'm beating this damn thing." The following protective factors were identified for Aaralyn: her grandchildren, a desire to lose weight, religion, and friends. She also discussed enjoying playing cards, talking on the phone, and reading. If she were to become overwhelmed in the future, which is a sign that a crisis may occur, she identified the following coping skills she could engage in: talking about it; engaging in physical therapy to have "endorphins kick in;" reading; and journaling. Phoebie's confidence in utilizing emergency resources should the feeling of being overwhelmed intensify was assessed on a scale of one to ten where one is not confident and ten is extremely confident. She reported her confidence is a 10. Linsay added, "I have come to the understanding that I have MS, but MS  does not have me."   Structured Assessment Results: The Patient Health Questionnaire-9 (PHQ-9) is a self-report measure that assesses symptoms and severity of depression over the course of the last two weeks. Zeina obtained a score of three suggesting minimal depression. Natahsa finds the endorsed symptoms to be not difficult at all. Depression screen [facility] 2/9 04/23/2018  Decreased Interest 0  Down, Depressed, Hopeless 0  PHQ - 2 Score 0  Altered sleeping 1  Tired, decreased energy 1  Change in appetite 0  Feeling bad or failure about yourself  1  Trouble concentrating 0  Moving slowly or fidgety/restless 0  Suicidal thoughts 0  PHQ-9 Score 3  Some recent data might be hidden   The Generalized Anxiety Disorder-7 (GAD-7) is a brief self-report measure that assesses symptoms of anxiety over the course of the last two weeks. Lissa obtained a score of one suggesting minimal anxiety.  GAD 7 : Generalized Anxiety Score 04/23/2018  Nervous, Anxious, on Edge 0  Control/stop worrying 0  Worry too much - different things 0  Trouble relaxing 1  Restless 0  Easily annoyed or irritable 0  Afraid - awful might happen 0  Total GAD 7 Score 1  Anxiety Difficulty Not difficult at all   Interventions: A chart review was conducted prior to the clinical intake interview. The MoCA, PHQ-9, and GAD-7 were administered and a clinical intake interview was completed. In addition, Nevayah was asked to complete a Mood and Food questionnaire to assess various behaviors related to emotional eating. Throughout session, empathic reflections and validation was provided. Continuing treatment with this provider was discussed and a treatment goal was established. Psychoeducation regarding emotional versus physical hunger was provided. Jernee was given a handout to utilize between now and the next appointment to increase awareness of hunger patterns and subsequent eating. She agreed and added, "I think you and I  are going to get along."   Provisional DSM-5 Diagnosis: 311 (F32.8) Other Specified Depressive Disorder, Emotional Eating  Plan: Mylynn expressed understanding and agreement with the initial treatment plan of care. She appears able and willing to  participate as evidenced by collaboration on a treatment goal, engagement in reciprocal conversation, and asking questions as needed for clarification. The next appointment will be scheduled in two weeks. The following treatment goal was established: decrease emotional eating. During this appointment, this provider will review emotional and physical hunger with [name].

## 2018-04-18 ENCOUNTER — Telehealth: Payer: Self-pay | Admitting: Neurology

## 2018-04-18 ENCOUNTER — Other Ambulatory Visit: Payer: Self-pay | Admitting: Internal Medicine

## 2018-04-19 NOTE — Telephone Encounter (Signed)
Rx sent to pharmacy   

## 2018-04-23 ENCOUNTER — Ambulatory Visit (INDEPENDENT_AMBULATORY_CARE_PROVIDER_SITE_OTHER): Payer: Medicare Other | Admitting: Neurology

## 2018-04-23 ENCOUNTER — Encounter: Payer: Self-pay | Admitting: Neurology

## 2018-04-23 ENCOUNTER — Ambulatory Visit (INDEPENDENT_AMBULATORY_CARE_PROVIDER_SITE_OTHER): Payer: Medicare Other | Admitting: Psychology

## 2018-04-23 ENCOUNTER — Ambulatory Visit (INDEPENDENT_AMBULATORY_CARE_PROVIDER_SITE_OTHER): Payer: Medicare Other | Admitting: Family Medicine

## 2018-04-23 VITALS — BP 108/71 | HR 72 | Temp 98.0°F | Ht 63.0 in | Wt 209.0 lb

## 2018-04-23 VITALS — BP 105/63 | HR 63 | Ht 63.0 in | Wt 207.0 lb

## 2018-04-23 DIAGNOSIS — G35 Multiple sclerosis: Secondary | ICD-10-CM | POA: Diagnosis not present

## 2018-04-23 DIAGNOSIS — G4730 Sleep apnea, unspecified: Secondary | ICD-10-CM

## 2018-04-23 DIAGNOSIS — R7303 Prediabetes: Secondary | ICD-10-CM

## 2018-04-23 DIAGNOSIS — Z6837 Body mass index (BMI) 37.0-37.9, adult: Secondary | ICD-10-CM | POA: Diagnosis not present

## 2018-04-23 DIAGNOSIS — F3289 Other specified depressive episodes: Secondary | ICD-10-CM | POA: Diagnosis not present

## 2018-04-23 DIAGNOSIS — E559 Vitamin D deficiency, unspecified: Secondary | ICD-10-CM

## 2018-04-23 NOTE — Progress Notes (Signed)
Office: [phone]  /  Fax: [phone]   HPI:   Chief Complaint: OBESITY Toni [name] is here to discuss her progress with her obesity treatment plan. She is on the Category 2 plan and is following her eating plan approximately 80-85 % of the time. She states she is doing physical therapy and at home exercises for 15-60 minutes 6 times per week. Toni [name] is struggling to follow her Category 2 plan. She has been resistant to change to journaling in the past but she is willing to consider this.  Her weight is 209 lb (94.8 kg) today and has gained 2 pounds since her last visit. She has lost 48 lbs since starting treatment with [name].  Pre-Diabetes with Hyperglycemia Toni [name] has a diagnosis of pre-diabetes based on her elevated Hgb A1c and was informed this puts her at greater risk of developing diabetes. She is stable with diet and weight loss, and she is working on decreasing simple carbohydrates. She is due for labs, and she notes she had 1 episode of hypoglycemia. She is not taking metformin currently and continues to work on diet and exercise to decrease risk of diabetes.   Vitamin D Deficiency Toni [name] has a diagnosis of vitamin D deficiency. She is on OTC Vit D and Ca+, and she is due for labs. She denies nausea, vomiting or muscle weakness.  ALLERGIES: Allergies  Allergen Reactions  . Hydrocodone Nausea And Vomiting    Hydrocodone causes vomiting  . Oxycodone Nausea And Vomiting    Oral oxycodone causes vomiting  . Chlorhexidine Gluconate Rash    Sores at site  . Zoloft [Sertraline] Hives, Swelling and Rash    MEDICATIONS: Current Outpatient Medications on File Prior to Visit  Medication Sig Dispense Refill  . acetaminophen (TYLENOL) 500 MG tablet Take 500-1,000 mg by mouth every 6 (six) hours as needed for fever or headache (or pain).    Marland Kitchen ALPRAZolam (XANAX) 0.5 MG tablet Take 1 tablet (0.5 mg total) by mouth 2 (two) times daily as needed for anxiety. 60 tablet 0  .  baclofen (LIORESAL) 20 MG tablet Take 1 tablet (20 mg total) by mouth 3 (three) times daily as needed for muscle spasms. (Patient taking differently: Take 20 mg by mouth See admin instructions. Once to twice a day for breakthrough spasms) 180 each 3  . Biotin (PA BIOTIN) 1000 MCG tablet Take 1,000 mcg by mouth daily.    Marland Kitchen buPROPion (WELLBUTRIN XL) 300 MG 24 hr tablet Take 1 tablet (300 mg total) by mouth daily. 30 tablet 0  . CALCIUM CITRATE PO Take 2 tablets by mouth 2 (two) times daily.    . cephALEXin (KEFLEX) 500 MG capsule Take 500 mg by mouth 3 (three) times daily.    . cetirizine (ZYRTEC) 10 MG tablet Take 10 mg by mouth daily.    . Cholecalciferol (VITAMIN D) 2000 units tablet Take 1 tablet (2,000 Units total) by mouth 2 (two) times daily.  0  . co-enzyme Q-10 30 MG capsule Take 30 mg by mouth daily.    . Cranberry 1000 MG CAPS Take by mouth.    . diltiazem (CARDIZEM CD) 180 MG 24 hr capsule TAKE 1 CAPSULE(180 MG) BY MOUTH DAILY 90 capsule 0  . docusate sodium (COLACE) 100 MG capsule Take 1 capsule (100 mg total) by mouth daily. 10 capsule 0  . fluticasone (FLONASE) 50 MCG/ACT nasal spray Place 2 sprays into both nostrils daily as needed for allergies.     . furosemide (LASIX) 40 MG  tablet Take 2 tablets (80mg ) by mouth in the morning and 1 tablet (40mg ) in the afternoon    . gabapentin (NEURONTIN) 300 MG capsule TAKE 1 CAPSULE BY MOUTH UP  TO 4 TIMES DAILY 360 capsule 3  . GENERLAC 10 GM/15ML SOLN TAKE 15 MLS BY MOUTH TWICE DAILY AS NEEDED 946 mL 0  . HP ACTHAR 80 UNIT/ML injectable gel INJECT 80 UNITS (1ML) SUBCUTANEOUSLY EVERY OTHER DAY FOR A TOTAL OF 10 DAYS. REPEAT EVERY 30 DAYS. (DISCARD 28 DAYS AFTER 1ST USE) 5 mL 5  . levothyroxine (SYNTHROID, LEVOTHROID) 75 MCG tablet Take 75 mcg by mouth daily.    . Multiple Vitamins-Minerals (CENTRUM SILVER PO) Take 1 tablet by mouth daily.    . Nebivolol HCl (BYSTOLIC) 20 MG TABS Take 1 tablet (20 mg total) by mouth daily. 30 tablet   . Omega-3  Fatty Acids (FISH OIL) 1000 MG CAPS Take 1,000 mg by mouth daily.    Marland Kitchen omeprazole (PRILOSEC) 20 MG capsule Take 20 mg by mouth daily.     . psyllium (METAMUCIL SMOOTH TEXTURE) 28 % packet Take 1 packet by mouth every other day.    . ranitidine (ZANTAC) 150 MG tablet Take 150 mg by mouth at bedtime.     . simvastatin (ZOCOR) 20 MG tablet Take 10 mg by mouth at bedtime.     . SYRINGE/NEEDLE, DISP, 1 ML (BD LUER-LOK SYRINGE) 25G X 5/8" 1 ML MISC Use as directed to inject  Acthar 10 each prn  . Tamsulosin HCl (FLOMAX) 0.4 MG CAPS Take 0.4 mg by mouth daily.    Marland Kitchen UNABLE TO FIND Baclofen pump: Continuous    . warfarin (COUMADIN) 5 MG tablet Take 1 tablet by mouth daily as directed by coumadin clinic. 90 tablet 1   Current Facility-Administered Medications on File Prior to Visit  Medication Dose Route Frequency Provider Last Rate Last Dose  . baclofen (LIORESAL) intrathecal injection 40 mg/73mL  40 mg Intrathecal Once Kathrynn Ducking, MD        PAST MEDICAL HISTORY: Past Medical History:  Diagnosis Date  . Abnormality of gait 03/01/2015  . Anxiety   . Arthritis    "knees" (01/19/2016)  . Atrial fibrillation with RVR ([place]) 01/19/2016  . Back pain   . Chest pain   . Childhood asthma   . Chronic pain    "nerve pain from the MS"  . Constipation   . Depression   . DVT (deep venous thrombosis) ([place]) 1983   "RLE; may have just been phlebitis; it was before the age of dopplers"  . 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 GERD (gastroesophageal reflux disease)   . History of stress test 06/21/10   limited exam with some degree of breast attenuatin of the apex however a component of apical ischemia is not excluded, EF 62%; cardiac cath   . HLD (hyperlipidemia)   . Hx of echocardiogram 04/22/10   EF 55-60%, no valve issues  . Hypertension   . Hypothyroidism   . Joint pain   . Migraine    "none since I stopped beta blocker in 1990s" (01/19/2016)   . Multiple sclerosis ([place])    has had this may 1989-[facility] reg doc  . OSA on CPAP   . Osteoarthritis   . PAF (paroxysmal atrial fibrillation) ([place])    on coumadin; documented on monitor 06/2010  . Pneumonia 11/2011  . Shortness of  breath   . Thyroid disease     PAST SURGICAL HISTORY: Past Surgical History:  Procedure Laterality Date  . ANTERIOR CERVICAL DECOMP/DISCECTOMY FUSION  01/2004  . BACK SURGERY    . CARDIAC CATHETERIZATION  06/22/2010   normal coronary arteries, PAF  . CATARACT EXTRACTION, BILATERAL Bilateral   . CHILD BIRTHS     X2  . DILATION AND CURETTAGE OF UTERUS    . INFUSION PUMP IMPLANTATION  ~ 2009   baclofen infusion in lower abd  . PAIN PUMP REVISION N/A 07/29/2014   Procedure: Baclofen pump replacement;  Surgeon: Erline Levine, MD;  Location: [facility];  Service: Neurosurgery;  Laterality: N/A;  Baclofen pump replacement  . TONSILLECTOMY AND ADENOIDECTOMY    . VARICOSE VEIN SURGERY  ~ 1968  . VENOUS ABLATION  12/16/2010   radiofreq ablation -Dr Elisabeth Cara and [facility]  . WISDOM TOOTH EXTRACTION      SOCIAL HISTORY: Social History   Tobacco Use  . Smoking status: Former Smoker    Packs/day: 0.50    Years: 7.00    Pack years: 3.50    Types: Cigarettes    Last attempt to quit: 08/21/1976    Years since quitting: 41.6  . Smokeless tobacco: Never Used  Substance Use Topics  . Alcohol use: Yes    Alcohol/week: 1.0 standard drinks    Types: 1 Glasses of wine per week  . Drug use: No    FAMILY HISTORY: Family History  Problem Relation Age of Onset  . Coronary artery disease Father        at age 78  . Hyperlipidemia Father   . Thyroid disease Father   . Coronary artery disease Maternal Grandmother   . Depression Maternal Grandmother   . Cancer Paternal Grandmother   . Depression Mother   . Sudden death Mother   . Anxiety disorder Mother   . Alcoholism Son     ROS: Review of Systems  Constitutional: Negative for weight loss.  Gastrointestinal:  Negative for nausea and vomiting.  Musculoskeletal:       Negative muscle weakness  Endo/Heme/Allergies:       Negative hypoglycemia    PHYSICAL EXAM: Blood pressure 108/71, pulse 72, temperature 98 F (36.7 C), temperature source Oral, height 5\' 3"  (1.6 m), weight 209 lb (94.8 kg), SpO2 100 %. Body mass index is 37.02 kg/m. Physical Exam  Constitutional: She is oriented to person, place, and time. She appears well-developed and well-nourished.  Cardiovascular: Normal rate.  Pulmonary/Chest: Effort normal.  Musculoskeletal: Normal range of motion.  Neurological: She is oriented to person, place, and time.  Skin: Skin is warm and dry.  Psychiatric: She has a normal mood and affect. Her behavior is normal.  Vitals reviewed.   RECENT LABS AND TESTS: BMET    Component Value Date/Time   NA 139 02/18/2018 1550   K 4.3 02/18/2018 1550   CL 99 02/18/2018 1550   CO2 26 02/18/2018 1550   GLUCOSE 125 (H) 02/18/2018 1550   GLUCOSE 92 07/08/2017 1214   BUN 23 02/18/2018 1550   CREATININE 0.60 02/18/2018 1550   CALCIUM 9.5 02/18/2018 1550   GFRNONAA 93 02/18/2018 1550   GFRAA 107 02/18/2018 1550   Lab Results  Component Value Date   HGBA1C 5.2 10/01/2017   HGBA1C 5.4 03/08/2017   HGBA1C  04/21/2010    5.5 (NOTE)  According to the ADA Clinical Practice Recommendations for 2011, when HbA1c is used as a screening test:   >=6.5%   Diagnostic of Diabetes Mellitus           (if abnormal result  is confirmed)  5.7-6.4%   Increased risk of developing Diabetes Mellitus  References:Diagnosis and Classification of Diabetes Mellitus,Diabetes JOAC,1660,63(KZSWF 1):S62-S69 and Standards of Medical Care in         Diabetes - 2011,Diabetes UXNA,3557,32  (Suppl 1):S11-S61.   Lab Results  Component Value Date   INSULIN 20.4 11/27/2017   INSULIN 20.4 03/08/2017   CBC    Component Value Date/Time   WBC 7.0 10/01/2017   WBC  8.4 07/08/2017 1214   RBC 4.83 07/08/2017 1214   HGB 15.1 10/01/2017   HGB 14.1 03/08/2017 1152   HCT 47 (A) 10/01/2017   HCT 43.0 03/08/2017 1152   PLT 185 10/01/2017   PLT 219 12/18/2014 1157   MCV 93.2 07/08/2017 1214   MCV 93 03/08/2017 1152   MCH 31.5 07/08/2017 1214   MCHC 33.8 07/08/2017 1214   RDW 13.3 07/08/2017 1214   RDW 13.3 03/08/2017 1152   LYMPHSABS 1.6 07/08/2017 1214   LYMPHSABS 2.2 03/08/2017 1152   MONOABS 1.3 (H) 07/08/2017 1214   EOSABS 0.2 07/08/2017 1214   EOSABS 0.1 03/08/2017 1152   BASOSABS 0.0 07/08/2017 1214   BASOSABS 0.0 03/08/2017 1152   Iron/TIBC/Ferritin/ %Sat No results found for: IRON, TIBC, FERRITIN, IRONPCTSAT Lipid Panel     Component Value Date/Time   CHOL 129 11/27/2017 1011   TRIG 60 11/27/2017 1011   HDL 51 11/27/2017 1011   CHOLHDL 3.0 04/22/2010 0536   VLDL 29 04/22/2010 0536   LDLCALC 66 11/27/2017 1011   Hepatic Function Panel     Component Value Date/Time   PROT 6.1 02/18/2018 1550   ALBUMIN 4.2 02/18/2018 1550   AST 18 02/18/2018 1550   ALT 31 02/18/2018 1550   ALKPHOS 62 02/18/2018 1550   BILITOT 0.3 02/18/2018 1550      Component Value Date/Time   TSH 0.651 11/27/2017 1011   TSH 2.410 03/08/2017 1152   TSH 1.083 08/06/2016 0647   TSH 1.871 01/19/2016 2302   TSH 0.206 (L) 03/10/2011 1351    ASSESSMENT AND PLAN: Prediabetes - Plan: Comprehensive metabolic panel, Hemoglobin A1c, Insulin, random  Vitamin D deficiency - Plan: VITAMIN D 25 Hydroxy (Vit-D Deficiency, Fractures)  Class 2 severe obesity with serious comorbidity and body mass index (BMI) of 37.0 to 37.9 in adult, unspecified obesity type ([place])  PLAN:  Pre-Diabetes with Hyperglycemia Toni [name] will continue to work on weight loss, exercise, and decreasing simple carbohydrates in her diet to help decrease the risk of diabetes. We dicussed metformin including benefits and risks. She was informed that eating too many simple carbohydrates or too many  calories at one sitting increases the likelihood of GI side effects. Toni [name] declined metformin for now and a prescription was not written today. We will check labs and Toni [name] agrees to follow up with our clinic in 2 to 3 weeks as directed to monitor her progress.  Vitamin D Deficiency Toni [name] was informed that low vitamin D levels contributes to fatigue and are associated with obesity, breast, and colon cancer. Toni [name] agrees to continue taking OTC Vit D and will follow up for routine testing of vitamin D, at least 2-3 times per year. She was informed of the risk of over-replacement of vitamin D and agrees to not increase her dose unless she  discusses this with [name] first. We will check labs and Toni [name] agrees to follow up with our clinic in 2 to 3 weeks.  Obesity Toni [name] is currently in the action stage of change. As such, her goal is to continue with weight loss efforts She has agreed to change to keep a food journal with 1200 calories and 70+ grams of protein daily Toni [name] has been instructed to work up to a goal of 150 minutes of combined cardio and strengthening exercise per week for weight loss and overall health benefits. We discussed the following Behavioral Modification Strategies today: increasing lean protein intake, decreasing simple carbohydrates, work on meal planning and easy cooking plans, and keep a strict food journal   Kaisley has agreed to follow up with our clinic in 2 to 3 weeks. She was informed of the importance of frequent follow up visits to maximize her success with intensive lifestyle modifications for her multiple health conditions.   OBESITY BEHAVIORAL INTERVENTION VISIT  Today's visit was # 21   Starting weight: 257 lbs Starting date: 03/08/17 Today's weight : 209 lbs Today's date: 04/23/2018 Total lbs lost to date: 48 At least 15 minutes were spent on discussing the following behavioral intervention visit.   ASK: We discussed the diagnosis of  obesity with Toni [name] today and Toni [name] agreed to give [name] permission to discuss obesity behavioral modification therapy today.  ASSESS: Toni [name] has the diagnosis of obesity and her BMI today is 37.03 Toni [name] is in the action stage of change   ADVISE: Toni [name] was educated on the multiple health risks of obesity as well as the benefit of weight loss to improve her health. She was advised of the need for long term treatment and the importance of lifestyle modifications to improve her current health and to decrease her risk of future health problems.  AGREE: Multiple dietary modification options and treatment options were discussed and  Toni [name] agreed to follow the recommendations documented in the above note.  ARRANGE: Toni [name] was educated on the importance of frequent visits to treat obesity as outlined per CMS and USPSTF guidelines and agreed to schedule her next follow up appointment today.  I, Trixie Dredge, am acting as transcriptionist for Dennard Nip, MD  I have reviewed the above documentation for accuracy and completeness, and I agree with the above. -Dennard Nip, MD

## 2018-04-23 NOTE — Progress Notes (Signed)
PATIENT: Toni [name] DOB: [birth year]  REASON FOR VISIT: follow up MS, OSA, Spasms, / renal cell carcinoma - Dr. Ernst Bowler   HISTORY FROM: patient alone   HISTORY OF PRESENT ILLNESS:  Today 04/23/18, Rv: I have the pleasure of seeing Toni [name] today,  A 72 year old female MS patient an sleep patient -my last visit with her was 4 months ago.  She presents today having lost again a significant amount of weight.  She follows Dr. Leafy Ro at the medical weight management clinic.  With her weight loss came also the needs to refit her CPAP mask.  She is using a BiPAP setting of 20/15 cmH2O and her residual AHI is 4.9 the best for long time.  She does have minimal leaks which speaks for the good fit of her current mask.  She uses the machine 100% of the time of 7 hours and 2 minutes of average use.  I would not like to change any settings and also the patient reports that she has pressure marks and sometimes almost bruising from her facial mask seems to be the best she has done in a long time.  Her Epworth sleepiness score also has improved she only endorsed 12 out of 24 points and she endorsed 20 out of 63 points of the fatigue severity score.  I see her as much less fatigued, and more optimistic.  Overall stronger with Pt , not longer with home health- now with "[facility]" 3 times a week. She now can stand long enough to pull up her pants. Her last labs were reviewed. She is in atrial fibrillation- rate controlled on diltiazem 180 mg, BP is lower.     Toni [name]  has been seen in this office for MS for 30 years, was diagnosed by Dr. Rosiland Oz in [phone]. Mr. Screws had several days of severe spasms, feeling tight, and she was unable to transfer, to walk with her walker. At baseline she can walk 40-50 feet. She was asked to used acthar 2 days in a row, and it seems to help, she has 6 more to go. She did not feel any improvement after taking baclofen.  She has been successful in losing a lot of weight under  Dr. Migdalia Dk medical weight management program, at that clinic she needs to stay and stand still on a special scale which allows the differentiation between total weight, water weight fluid and fat content.  She is not able right now to stand still enough to allow this machine to work.  However her weight loss has been so significant that she has some loose skin now up to 18 her current weight is 218 pounds and this will certainly support her apnea treatment.  Orders the patient is here followed also for complex and central sleep apnea, the control had been important as she also developed atrial fibrillation followed by Dr. Debara Pickett.   12-19-2017, RV . Follow up visit with Toni [name], better known as Toni [name], established 72 year old Caucasian female patient with long-standing history of MS.  She just celebrated her 37th wedding anniversary with her husband.  The patient has been followed for a prescription pump and fill by Dr. [name] she has an appointment on April 11 and at that time her baclofen rate was increased to 799.5 mcg/day, for a day if she felt floppy and weak.  The very next day the dose was reduced to 700.3 mcg a day and since then she has been doing  okay.  She still uses actor, she still follows with Dr. Leafy Ro at the medical weight management clinic.  Dr. Debara Pickett is her cardiologist, Dr. Dagmar Hait her primary care physician. The patient most recently saw Dr. Leafy Ro she was unable to step up to the scale and told safely on.  Dr. Leafy Ro obtain a metabolic panel which showed hypokalemia we will review this in detail later on today's visit, she has been started on potassium supplements.   Date , MM: [name] is a 72 year old female with a history of multiple sclerosis and obstructive sleep apnea on BiPAP.   she returns today for follow-up.  Her BiPAP indicates that she use her machine 30 out of 30 days for compliance of 100%.  She use her machine greater than 4 hours 28 out of 30 days for compliance  of 93%.  On average she uses her machine 7 hours and 14 minutes.  Her IPAP is 20 cm of water and EPAP pressure of 15 cm of water her residual AHI is 16.7.  She does not have a significant leak.  The patient reports that she has been seeing Dr. Leafy Ro for weight loss.  Reports that she is lost approximately 30 pounds.  She has an appointment this coming Wednesday.  The patient denies any new numbness or weakness does feel like her hands is getting gradually weaker.  She states that she does participate in physical therapy 1 time a week but then does her own exercises 3 times a week.  She uses a wheelchair primarily.  She reports that she has several more months of Betaseron left but once her prescription runs out she plans to stop this medication.  She reports that taking Acthar works better for her.  She reports that she has been on Copaxone and Avonex but never any of the oral medications.  She returns today for an evaluation.  HISTORY :My long-standing patient Toni [name] has struggled trying to control her complex sleep apneas , which are central and obstructive in nature. She has started on a BiPAP setting of 20/15 cm water and has been 100% compliance for the last 30 days with an average of 7 hours and 42 minutes. The residual AHI is still 13.9 area advanced home care did call her and told her that there with 3 days consecutively during which her AHI was markedly reduced. This occurred during May, and it related to Toni [name] being given at double dose of diuretics , while she was at [facility] rehab. It was most nights after a high diuretic was given that she slept the best and happily with congestive central apnea. In the meantime, her cardiologist, Dr. Debara Pickett, has increased Lasix from 60-80 mg daily but she has not had the same effect as she had at 100 mg daily. She is continuously in a fib now. No ablation as long as apnea is not treated.   Dr. Leafy Ro did an extensive lab panel,  Good HB a1c. She  was low on protein- and she sees a nutritionist. [name] is now cooking for her. Lots of chicken and vegetables- and she enjoys the food.   MS stable on Acthar. No diabetes on acthar and no relapses into acute a fib. Melatonin helps sleep. Given that Mrs. Noah has suffered from [place] for almost 30 years now.   REVIEW OF SYSTEMS: Out of a complete 14 system review of symptoms, the patient complains only of the following symptoms, and all other reviewed systems are negative.  Ankle and lag edema, Urinary urge.  She uses a BiPAP - 20-15 cm water.  Epworth 8 on Acthar and Baclofen .   ALLERGIES: Allergies  Allergen Reactions  . Hydrocodone Nausea And Vomiting    Hydrocodone causes vomiting  . Oxycodone Nausea And Vomiting    Oral oxycodone causes vomiting  . Chlorhexidine Gluconate Rash    Sores at site  . Zoloft [Sertraline] Hives, Swelling and Rash    HOME MEDICATIONS: Outpatient Medications Prior to Visit  Medication Sig Dispense Refill  . acetaminophen (TYLENOL) 500 MG tablet Take 500-1,000 mg by mouth every 6 (six) hours as needed for fever or headache (or pain).    Marland Kitchen ALPRAZolam (XANAX) 0.5 MG tablet Take 1 tablet (0.5 mg total) by mouth 2 (two) times daily as needed for anxiety. 60 tablet 0  . baclofen (LIORESAL) 20 MG tablet Take 1 tablet (20 mg total) by mouth 3 (three) times daily as needed for muscle spasms. (Patient taking differently: Take 20 mg by mouth See admin instructions. Once to twice a day for breakthrough spasms) 180 each 3  . Biotin (PA BIOTIN) 1000 MCG tablet Take 1,000 mcg by mouth daily.    Marland Kitchen buPROPion (WELLBUTRIN XL) 300 MG 24 hr tablet Take 1 tablet (300 mg total) by mouth daily. 30 tablet 0  . CALCIUM CITRATE PO Take 2 tablets by mouth 2 (two) times daily.    . cephALEXin (KEFLEX) 500 MG capsule Take 500 mg by mouth 3 (three) times daily.    . cetirizine (ZYRTEC) 10 MG tablet Take 10 mg by mouth daily.    . Cholecalciferol (VITAMIN D) 2000 units tablet Take 1 tablet  (2,000 Units total) by mouth 2 (two) times daily.  0  . co-enzyme Q-10 30 MG capsule Take 30 mg by mouth daily.    . Cranberry 1000 MG CAPS Take by mouth.    . diltiazem (CARDIZEM CD) 180 MG 24 hr capsule TAKE 1 CAPSULE(180 MG) BY MOUTH DAILY 90 capsule 0  . docusate sodium (COLACE) 100 MG capsule Take 1 capsule (100 mg total) by mouth daily. 10 capsule 0  . fluticasone (FLONASE) 50 MCG/ACT nasal spray Place 2 sprays into both nostrils daily as needed for allergies.     . furosemide (LASIX) 40 MG tablet Take 2 tablets (80mg ) by mouth in the morning and 1 tablet (40mg ) in the afternoon    . gabapentin (NEURONTIN) 300 MG capsule TAKE 1 CAPSULE BY MOUTH UP  TO 4 TIMES DAILY 360 capsule 3  . GENERLAC 10 GM/15ML SOLN TAKE 15 MLS BY MOUTH TWICE DAILY AS NEEDED 946 mL 0  . HP ACTHAR 80 UNIT/ML injectable gel INJECT 80 UNITS (1ML) SUBCUTANEOUSLY EVERY OTHER DAY FOR A TOTAL OF 10 DAYS. REPEAT EVERY 30 DAYS. (DISCARD 28 DAYS AFTER 1ST USE) 5 mL 5  . levothyroxine (SYNTHROID, LEVOTHROID) 75 MCG tablet Take 75 mcg by mouth daily.    . Multiple Vitamins-Minerals (CENTRUM SILVER PO) Take 1 tablet by mouth daily.    . Nebivolol HCl (BYSTOLIC) 20 MG TABS Take 1 tablet (20 mg total) by mouth daily. 30 tablet   . Omega-3 Fatty Acids (FISH OIL) 1000 MG CAPS Take 1,000 mg by mouth daily.    Marland Kitchen omeprazole (PRILOSEC) 20 MG capsule Take 20 mg by mouth daily.     . psyllium (METAMUCIL SMOOTH TEXTURE) 28 % packet Take 1 packet by mouth every other day.    . ranitidine (ZANTAC) 150 MG tablet Take 150 mg by mouth  at bedtime.     . simvastatin (ZOCOR) 10 MG tablet     . SYRINGE/NEEDLE, DISP, 1 ML (BD LUER-LOK SYRINGE) 25G X 5/8" 1 ML MISC Use as directed to inject  Acthar 10 each prn  . Tamsulosin HCl (FLOMAX) 0.4 MG CAPS Take 0.4 mg by mouth daily.    Marland Kitchen UNABLE TO FIND Baclofen pump: Continuous    . warfarin (COUMADIN) 5 MG tablet Take 1 tablet by mouth daily as directed by coumadin clinic. 90 tablet 1  . simvastatin  (ZOCOR) 20 MG tablet Take 10 mg by mouth at bedtime.      Facility-Administered Medications Prior to Visit  Medication Dose Route Frequency Provider Last Rate Last Dose  . baclofen (LIORESAL) intrathecal injection 40 mg/33mL  40 mg Intrathecal Once Kathrynn Ducking, MD        PAST MEDICAL HISTORY: Past Medical History:  Diagnosis Date  . Abnormality of gait 03/01/2015  . Anxiety   . Arthritis    "knees" (01/19/2016)  . Atrial fibrillation with RVR ([place]) 01/19/2016  . Back pain   . Chest pain   . Childhood asthma   . Chronic pain    "nerve pain from the MS"  . Constipation   . Depression   . DVT (deep venous thrombosis) ([place]) 1983   "RLE; may have just been phlebitis; it was before the age of dopplers"  . 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 GERD (gastroesophageal reflux disease)   . History of stress test 06/21/10   limited exam with some degree of breast attenuatin of the apex however a component of apical ischemia is not excluded, EF 62%; cardiac cath   . HLD (hyperlipidemia)   . Hx of echocardiogram 04/22/10   EF 55-60%, no valve issues  . Hypertension   . Hypothyroidism   . Joint pain   . Migraine    "none since I stopped beta blocker in 1990s" (01/19/2016)  . Multiple sclerosis ([place])    has had this may 1989-[facility] reg doc  . OSA on CPAP   . Osteoarthritis   . PAF (paroxysmal atrial fibrillation) ([place])    on coumadin; documented on monitor 06/2010  . Pneumonia 11/2011  . Shortness of breath   . Thyroid disease     PAST SURGICAL HISTORY: Past Surgical History:  Procedure Laterality Date  . ANTERIOR CERVICAL DECOMP/DISCECTOMY FUSION  01/2004  . BACK SURGERY    . CARDIAC CATHETERIZATION  06/22/2010   normal coronary arteries, PAF  . CATARACT EXTRACTION, BILATERAL Bilateral   . CHILD BIRTHS     X2  . DILATION AND CURETTAGE OF UTERUS    . INFUSION PUMP IMPLANTATION  ~ 2009   baclofen infusion in lower abd  .  PAIN PUMP REVISION N/A 07/29/2014   Procedure: Baclofen pump replacement;  Surgeon: Erline Levine, MD;  Location: [facility];  Service: Neurosurgery;  Laterality: N/A;  Baclofen pump replacement  . TONSILLECTOMY AND ADENOIDECTOMY    . VARICOSE VEIN SURGERY  ~ 1968  . VENOUS ABLATION  12/16/2010   radiofreq ablation -Dr Elisabeth Cara and [facility]  . WISDOM TOOTH EXTRACTION      FAMILY HISTORY: Family History  Problem Relation Age of Onset  . Coronary artery disease Father        at age 93  . Hyperlipidemia Father   . Thyroid disease Father   . Coronary artery disease Maternal Grandmother   .  Depression Maternal Grandmother   . Cancer Paternal Grandmother   . Depression Mother   . Sudden death Mother   . Anxiety disorder Mother   . Alcoholism Son     SOCIAL HISTORY: Social History   Socioeconomic History  . Marital status: Married    Spouse name: [name]  . Number of children: 2  . Years of education: COLLEGE  . Highest education level: Not on file  Occupational History  . Occupation: RETIRED  Social Needs  . Financial resource strain: Not on file  . Food insecurity:    Worry: Not on file    Inability: Not on file  . Transportation needs:    Medical: Not on file    Non-medical: Not on file  Tobacco Use  . Smoking status: Former Smoker    Packs/day: 0.50    Years: 7.00    Pack years: 3.50    Types: Cigarettes    Last attempt to quit: 08/21/1976    Years since quitting: 41.6  . Smokeless tobacco: Never Used  Substance and Sexual Activity  . Alcohol use: Yes    Alcohol/week: 1.0 standard drinks    Types: 1 Glasses of wine per week  . Drug use: No  . Sexual activity: Not Currently  Lifestyle  . Physical activity:    Days per week: Not on file    Minutes per session: Not on file  . Stress: Not on file  Relationships  . Social connections:    Talks on phone: Not on file    Gets together: Not on file    Attends religious service: Not on file    Active member of club or  organization: Not on file    Attends meetings of clubs or organizations: Not on file    Relationship status: Not on file  . Intimate partner violence:    Fear of current or ex partner: Not on file    Emotionally abused: Not on file    Physically abused: Not on file    Forced sexual activity: Not on file  Other Topics Concern  . Not on file  Social History Narrative   Lives in [place] with Husband [name] [phone], [phone]   Currently in rehab      PHYSICAL EXAM  Vitals:   04/23/18 1553  BP: 105/63  Pulse: 63  Weight: 207 lb (93.9 kg)  Height: 5\' 3"  (1.6 m)   Body mass index is 36.67 kg/m.  Generalized: Well developed, in no acute distress, wheelchair    irregular heart beat, a fibrillation.  Wheelchair bound. .  Excess skin after weight loss, chin and upper arms.  . bilateral edema up to the knees. Broken blood vessels.  Lungs are clear to auscultation.   Neurological examination :  Mentation: Alert oriented to time, place, history taking. Follows all commands speech and language fluent Cranial nerve : Pupils were equal round reactive to light. Extraocular movements were full, visual field were full on confrontational test. Facial sensation and strength were normal. Tongue in midline.  Head turning and shoulder shrug  were normal and symmetric. Motor: The patient cannot fully extend her right upper extremity at the elbow, but she has good biceps flexion strength.   Grip strength is weak and bilaterally there is atrophy of the thenar eminence noted.   Her fingers feel stiff, she noted an abdominal and chest spasm, she is unable right now to walk has lower back pain and feels generalized weak and heavy.  After has  given her some energy back.  As to sensory she noticed ongoing numbness in the fingers of both hands the pounds but no numbness in the legs.  Loss of sensation at the feet and ankle which may be related to the significant edema.  We did not ambulate   DIAGNOSTIC  DATA (LABS, IMAGING, TESTING) - I reviewed patient records, labs, notes, testing and imaging myself where available.  Lab Results  Component Value Date   WBC 7.0 10/01/2017   HGB 15.1 10/01/2017   HCT 47 (A) 10/01/2017   MCV 93.2 07/08/2017   PLT 185 10/01/2017      Component Value Date/Time   NA 139 02/18/2018 1550   K 4.3 02/18/2018 1550   CL 99 02/18/2018 1550   CO2 26 02/18/2018 1550   GLUCOSE 125 (H) 02/18/2018 1550   GLUCOSE 92 07/08/2017 1214   BUN 23 02/18/2018 1550   CREATININE 0.60 02/18/2018 1550   CALCIUM 9.5 02/18/2018 1550   PROT 6.1 02/18/2018 1550   ALBUMIN 4.2 02/18/2018 1550   AST 18 02/18/2018 1550   ALT 31 02/18/2018 1550   ALKPHOS 62 02/18/2018 1550   BILITOT 0.3 02/18/2018 1550   GFRNONAA 93 02/18/2018 1550   GFRAA 107 02/18/2018 1550   Lab Results  Component Value Date   CHOL 129 11/27/2017   HDL 51 11/27/2017   LDLCALC 66 11/27/2017   TRIG 60 11/27/2017   CHOLHDL 3.0 04/22/2010   Lab Results  Component Value Date   HGBA1C 5.2 10/01/2017   No results found for: [id number] Lab Results  Component Value Date   TSH 0.651 11/27/2017      ASSESSMENT AND PLAN 72 y.o. year old female  has a past medical history of Abnormality of gait (03/01/2015), Anxiety, Arthritis, Atrial fibrillation with RVR ([place]) (01/19/2016), Back pain, Chest pain, Childhood asthma, Chronic pain, Constipation, Depression, DVT (deep venous thrombosis) ([place]) (1983), Edema, GERD (gastroesophageal reflux disease), History of stress test (06/21/10), HLD (hyperlipidemia), echocardiogram (04/22/10), Hypertension, Hypothyroidism, Joint pain, Migraine, Multiple sclerosis ([place]), OSA on CPAP, Osteoarthritis, PAF (paroxysmal atrial fibrillation) ([place]), Pneumonia (11/2011), Shortness of breath, and Thyroid disease. here with:  1.  COMPLEX sleep apnea on BiPAP- has enough central apneas to cause her atrial fib, but we were unable to control her apnea and she is therefor not a cardioversion  candidate.  download of PAP- Mrs. Lucarelli has been extremely compliant has used at 100% of the last 90 days was 100% compliance by time,  her BiPAP machine has a inspiratory pressure setting of 20 expiratory pressure of 15 her-  AHI is 4.9  which is significantly reduced over her baseline,  2.  Multiple sclerosis on Acthar, which she hs sparingly used over the last 6 mon 3. PT helped with gait problems, sensory abnormalities and " MS hugs" .4. Atrial fibrillation,chronic and persistent.   She will continue on BiPAP therapy 20 over 15 cm. The patient wants to continue on Betaseron- and she will remain on Acthar.  Dr [name] will try an increase of baclofen application by pump. As described above, she is now on 700 mcg a day.   Renal cell carcinoma - Dr. Tresa Moore- [facility] .    She is advised that if her symptoms worsen or she develops new symptoms she should let us know.  She is very much better- more strength, optimism and no falls.the weight loss has much improved her central and obstructive apneas.    Montreal Cognitive Assessment  10/04/2015 06/02/2015 03/30/2015  Visuospatial/ Executive (0/5) 4 5 5   Naming (0/3) 3 3 3   Attention: Read list of digits (0/2) 2 2 2   Attention: Read list of letters (0/1) 1 1 1   Attention: Serial 7 subtraction starting at 100 (0/3) 3 3 3   Language: Repeat phrase (0/2) 2 2 2   Language : Fluency (0/1) 1 1 1   Abstraction (0/2) 2 2 2   Delayed Recall (0/5) 5 5 5   Orientation (0/6) 6 6 6   Total 29 30 30   Adjusted Score (based on education) 29 30 30       She will follow-up in January 2020 months with me, Dr. Brett Fairy and in 6 month with NP.   Visit was 25 minutes long and addressed 3 problems.    [name], MD  04/23/2018, 4:26 PM [facility] [address] [place], [place] [address] [phone]

## 2018-04-23 NOTE — Patient Instructions (Signed)
I like for Mrs. [facility] memory tests to be shared with [facility].

## 2018-04-24 DIAGNOSIS — Z9181 History of falling: Secondary | ICD-10-CM | POA: Diagnosis not present

## 2018-04-24 DIAGNOSIS — M6281 Muscle weakness (generalized): Secondary | ICD-10-CM | POA: Diagnosis not present

## 2018-04-24 DIAGNOSIS — R262 Difficulty in walking, not elsewhere classified: Secondary | ICD-10-CM | POA: Diagnosis not present

## 2018-04-24 DIAGNOSIS — M25669 Stiffness of unspecified knee, not elsewhere classified: Secondary | ICD-10-CM | POA: Diagnosis not present

## 2018-04-24 LAB — COMPREHENSIVE METABOLIC PANEL
ALT: 31 IU/L (ref 0–32)
AST: 22 IU/L (ref 0–40)
Albumin/Globulin Ratio: 2 (ref 1.2–2.2)
Albumin: 4 g/dL (ref 3.5–4.8)
Alkaline Phosphatase: 62 IU/L (ref 39–117)
BILIRUBIN TOTAL: 0.3 mg/dL (ref 0.0–1.2)
BUN/Creatinine Ratio: 38 — ABNORMAL HIGH (ref 12–28)
BUN: 26 mg/dL (ref 8–27)
CALCIUM: 9.5 mg/dL (ref 8.7–10.3)
CHLORIDE: 95 mmol/L — AB (ref 96–106)
CO2: 30 mmol/L — ABNORMAL HIGH (ref 20–29)
Creatinine, Ser: 0.69 mg/dL (ref 0.57–1.00)
GFR, EST AFRICAN AMERICAN: 102 mL/min/{1.73_m2} (ref >59)
GFR, EST NON AFRICAN AMERICAN: 88 mL/min/{1.73_m2} (ref >59)
GLUCOSE: 45 mg/dL — AB (ref 65–99)
Globulin, Total: 2 g/dL (ref 1.5–4.5)
Potassium: 4.2 mmol/L (ref 3.5–5.2)
Sodium: 144 mmol/L (ref 134–144)
TOTAL PROTEIN: 6 g/dL (ref 6.0–8.5)

## 2018-04-24 LAB — VITAMIN D 25 HYDROXY (VIT D DEFICIENCY, FRACTURES): VIT D 25 HYDROXY: 65.8 ng/mL (ref 30.0–100.0)

## 2018-04-24 LAB — HEMOGLOBIN A1C
ESTIMATED AVERAGE GLUCOSE: 105 mg/dL
Hgb A1c MFr Bld: 5.3 % (ref 4.8–5.6)

## 2018-04-24 LAB — INSULIN, RANDOM: INSULIN: 7.8 u[IU]/mL (ref 2.6–24.9)

## 2018-04-25 ENCOUNTER — Ambulatory Visit (INDEPENDENT_AMBULATORY_CARE_PROVIDER_SITE_OTHER): Payer: Medicare Other | Admitting: Pharmacist Clinician (PhC)/ Clinical Pharmacy Specialist

## 2018-04-25 DIAGNOSIS — I4891 Unspecified atrial fibrillation: Secondary | ICD-10-CM

## 2018-04-25 DIAGNOSIS — I482 Chronic atrial fibrillation: Secondary | ICD-10-CM

## 2018-04-25 DIAGNOSIS — Z7901 Long term (current) use of anticoagulants: Secondary | ICD-10-CM

## 2018-04-25 DIAGNOSIS — M6281 Muscle weakness (generalized): Secondary | ICD-10-CM | POA: Diagnosis not present

## 2018-04-25 DIAGNOSIS — R262 Difficulty in walking, not elsewhere classified: Secondary | ICD-10-CM | POA: Diagnosis not present

## 2018-04-25 DIAGNOSIS — M25669 Stiffness of unspecified knee, not elsewhere classified: Secondary | ICD-10-CM | POA: Diagnosis not present

## 2018-04-25 DIAGNOSIS — Z9181 History of falling: Secondary | ICD-10-CM | POA: Diagnosis not present

## 2018-04-25 DIAGNOSIS — I4821 Permanent atrial fibrillation: Secondary | ICD-10-CM

## 2018-04-25 LAB — POCT INR: INR: 2.2 (ref 2.0–3.0)

## 2018-04-29 DIAGNOSIS — Z124 Encounter for screening for malignant neoplasm of cervix: Secondary | ICD-10-CM | POA: Diagnosis not present

## 2018-04-30 ENCOUNTER — Telehealth: Payer: Self-pay | Admitting: Neurology

## 2018-04-30 DIAGNOSIS — Z9181 History of falling: Secondary | ICD-10-CM | POA: Diagnosis not present

## 2018-04-30 DIAGNOSIS — M25669 Stiffness of unspecified knee, not elsewhere classified: Secondary | ICD-10-CM | POA: Diagnosis not present

## 2018-04-30 DIAGNOSIS — R262 Difficulty in walking, not elsewhere classified: Secondary | ICD-10-CM | POA: Diagnosis not present

## 2018-04-30 DIAGNOSIS — M6281 Muscle weakness (generalized): Secondary | ICD-10-CM | POA: Diagnosis not present

## 2018-04-30 NOTE — Telephone Encounter (Signed)
Patient calling to advise she did not request refill for ALPRAZolam (XANAX) 0.5 MG tablet. [facility] sent automatically. Patient does not need this medication.

## 2018-05-02 DIAGNOSIS — R262 Difficulty in walking, not elsewhere classified: Secondary | ICD-10-CM | POA: Diagnosis not present

## 2018-05-02 DIAGNOSIS — M6281 Muscle weakness (generalized): Secondary | ICD-10-CM | POA: Diagnosis not present

## 2018-05-02 DIAGNOSIS — Z9181 History of falling: Secondary | ICD-10-CM | POA: Diagnosis not present

## 2018-05-02 DIAGNOSIS — M25669 Stiffness of unspecified knee, not elsewhere classified: Secondary | ICD-10-CM | POA: Diagnosis not present

## 2018-05-07 DIAGNOSIS — M6281 Muscle weakness (generalized): Secondary | ICD-10-CM | POA: Diagnosis not present

## 2018-05-07 DIAGNOSIS — Z9181 History of falling: Secondary | ICD-10-CM | POA: Diagnosis not present

## 2018-05-07 DIAGNOSIS — R262 Difficulty in walking, not elsewhere classified: Secondary | ICD-10-CM | POA: Diagnosis not present

## 2018-05-07 DIAGNOSIS — M25669 Stiffness of unspecified knee, not elsewhere classified: Secondary | ICD-10-CM | POA: Diagnosis not present

## 2018-05-07 NOTE — Progress Notes (Signed)
Office: [phone]  /  Fax: [phone]   Date: May 13, 2018 Time Seen: 4:00pm Duration: 32 minutes Provider: Glennie Isle, Psy.D. Type of Session: Individual Therapy   HPI: Toni [name] was referred by Dr. Dennard Nip due to depression with emotional eating behaviors and was seen for an initial appointment with this provider on April 23, 2018. Per the note for the office visit with Dr. Dennard Nip on April 01, 2018, "Elizabethis struggling with night time eating, especially between 9 and 11 pm. She knows it is emotional eating but can't seem to change her behavior. Toni [name] struggles withemotional eating and using food for comfort to the extent that it is negatively impacting herhealth. Sheoften snacks when sheis not hungry. Elizabethsometimes feels sheis out of control and then feels guilty that shemade poor food choices. Toni [name] been working on behavior modification techniques to help reduce heremotional eating and has been somewhat successful.Sheshows no sign of suicidal or homicidal ideations."  Moreover, per the note for the initial visit with Dr. Dennard Nip on March 08, 2017, "Toni [name] struggled with obesity for years and has been unsuccessful in either losing weight or maintaining long term weight loss." She shared shehas been heavy most of herlife and started gaining weight in her 52s. Herheaviest weight ever was 301pounds. During that initial appointment, she also endorsed the following: significant food cravings issues; snacking frequently in the evenings; skipping meals frequently; frequently drinking liquids with calories; problems with excessive hunger;frequently eating larger portions than normal; binge eating behaviors; and emotional eating.   During the initial appointment with this provider, Toni [name] shared, "I have real problems in the evening and I don't know why. I don't know if it is physiological or psychological." She described she can go  long periods during the day and not eat, but experiences difficulty in the evening. The aforementioned has been going on Toni [name]'s "whole life." She noted she cannot recall a time where she was "not heavy." Toni [name] denied a history of binge eating, purging behaviors, and other compensatory behaviors. She denied a history of a diagnosis of an eating disorder. Toni [name] was asked to complete a questionnaire assessing various behaviors related to emotional eating. Toni [name] endorsed the following: experience food cravings on a regular basis, use food to help you cope with emotional situations, overeat when you are worried about something, overeat frequently when you are bored or lonely and overeat when you are alone, but eat much less when you are with other people. During today's appointment, Toni [name] shared she continues to eat after dinner and identified it as emotional eating.   Session Content: Session focused on the following treatment goal: decrease emotional eating. The session was initiated with the administration of the PHQ-9 and GAD-7, as well as a brief check-in. Information concerning the practice, financial arrangements, and confidentiality and patients' rights were discussed during the initial appointment; however, per this provider's new office policy, [name] and her power of attorney, [name] (husband), were asked to sign a service agreement related to the aforementioned. The agreement was reviewed with, and signed by, both Toni [name] and her husband. Toni [name] noted she forgot to bring documentation stating her husband is her power of attorney. She was encouraged to bring it for the next appointment. Regarding emotional versus physical hunger, Toni [name] shared, "From supper time to night is the worst time." She indicated she saves her snack calories to eat during the aforementioned time. This provider explored hunger as Toni [name] indicated she was saving snack calories for the  evening. Toni [name]  indicated that despite saving the calories, she is not hungry. Furthermore, psychoeducation regarding triggers for emotional eating was provided. Audine was provided a handout, and encouraged to utilize the handout between now and the next appointment to increase awareness of triggers and frequency. Reizy agreed. Overall, Toni [name] was receptive to today's session as evidenced by her openness to sharing, responsiveness to feedback, and willingness to discuss her triggers for emotional eating.   Mental Status Examination: [name] arrived on time for the appointment. She presented as appropriately dressed and groomed. [name] appeared her stated age and demonstrated adequate orientation to time, place, person, and purpose of the appointment. She also demonstrated appropriate eye contact. No behavioral peculiarities noted. She was observed ambulating with a wheelchair. Her mood was euthymic with congruent affect. Her thought processes were logical, linear, and goal-directed. No hallucinations, delusions, bizarre thinking or behavior reported or observed. Judgment, insight, and impulse control appeared to be grossly intact. There was no evidence of paraphasias (i.e., errors in speech, gross mispronunciations, and word substitutions), repetition deficits, or disturbances in volume or prosody (i.e., rhythm and intonation). There was no evidence of attention or memory impairments. [name] denied current suicidal and homicidal ideation, intent or plan.  Structured Assessment Results: The Patient Health Questionnaire-9 (PHQ-9) is a self-report measure that assesses symptoms and severity of depression over the course of the last two weeks. Toni [name] obtained a score of two suggesting minimal depression. Toni [name] finds the endorsed symptoms to be somewhat difficult. She attributed the endorsed symptoms to be secondary to physical concerns.  Depression screen Toni [name] 2/9 05/13/2018  Decreased  Interest 0  Down, Depressed, Hopeless 0  PHQ - 2 Score 0  Altered sleeping 1  Tired, decreased energy 1  Change in appetite 0  Feeling bad or failure about yourself  0  Trouble concentrating 0  Moving slowly or fidgety/restless 0  Suicidal thoughts 0  PHQ-9 Score 2  Some recent data might be hidden   The Generalized Anxiety Disorder-7 (GAD-7) is a brief self-report measure that assesses symptoms of anxiety over the course of the last two weeks. Kyndel obtained a score of one suggesting minimal anxiety. GAD 7 : Generalized Anxiety Score 05/13/2018  Nervous, Anxious, on Edge 0  Control/stop worrying 0  Worry too much - different things 0  Trouble relaxing 0  Restless 0  Easily annoyed or irritable 1  Afraid - awful might happen 0  Total GAD 7 Score 1  Anxiety Difficulty Somewhat difficult   Interventions: Shreshta was administered the PHQ-9 and GAD-7 for symptom monitoring. Content from the last session was reviewed. Throughout today's session, empathic reflections and validation were provided. Psychoeducation regarding triggers for emotional eating was provided and a handout was given to [place].   DSM-5 Diagnosis: 311 (F32.8) Other Specified Depressive Disorder, Emotional Eating  Treatment Goal & Progress: Rani was seen for an initial appointment with this provider on April 23, 2018 during which the following treatment goal was established: decrease emotional eating. Jendaya has demonstrated progress in her goal of reducing emotional eating as evidenced by her increased awareness of hunger patterns and willingness to explore triggers for emotional eating during today's appointment.   Plan: Shanie continues to appear able and willing to participate as evidenced by engagement in reciprocal conversation, and asking questions for clarification as appropriate. The next appointment will be scheduled in three weeks per [facility] request. The next session will focus on  reviewing triggers for emotional eating and working towards the established treatment goal.

## 2018-05-09 DIAGNOSIS — R262 Difficulty in walking, not elsewhere classified: Secondary | ICD-10-CM | POA: Diagnosis not present

## 2018-05-09 DIAGNOSIS — M25669 Stiffness of unspecified knee, not elsewhere classified: Secondary | ICD-10-CM | POA: Diagnosis not present

## 2018-05-09 DIAGNOSIS — Z9181 History of falling: Secondary | ICD-10-CM | POA: Diagnosis not present

## 2018-05-09 DIAGNOSIS — M6281 Muscle weakness (generalized): Secondary | ICD-10-CM | POA: Diagnosis not present

## 2018-05-13 ENCOUNTER — Ambulatory Visit (INDEPENDENT_AMBULATORY_CARE_PROVIDER_SITE_OTHER): Payer: Medicare Other | Admitting: Psychology

## 2018-05-13 ENCOUNTER — Ambulatory Visit (INDEPENDENT_AMBULATORY_CARE_PROVIDER_SITE_OTHER): Payer: Medicare Other | Admitting: Family Medicine

## 2018-05-13 VITALS — BP 95/62 | HR 84 | Temp 98.2°F | Ht 63.0 in | Wt 204.0 lb

## 2018-05-13 DIAGNOSIS — R262 Difficulty in walking, not elsewhere classified: Secondary | ICD-10-CM | POA: Diagnosis not present

## 2018-05-13 DIAGNOSIS — Z6836 Body mass index (BMI) 36.0-36.9, adult: Secondary | ICD-10-CM | POA: Diagnosis not present

## 2018-05-13 DIAGNOSIS — M25669 Stiffness of unspecified knee, not elsewhere classified: Secondary | ICD-10-CM | POA: Diagnosis not present

## 2018-05-13 DIAGNOSIS — I10 Essential (primary) hypertension: Secondary | ICD-10-CM | POA: Diagnosis not present

## 2018-05-13 DIAGNOSIS — F3289 Other specified depressive episodes: Secondary | ICD-10-CM

## 2018-05-13 DIAGNOSIS — E66812 Morbid (severe) obesity due to excess calories: Secondary | ICD-10-CM

## 2018-05-13 DIAGNOSIS — M6281 Muscle weakness (generalized): Secondary | ICD-10-CM | POA: Diagnosis not present

## 2018-05-13 DIAGNOSIS — Z9181 History of falling: Secondary | ICD-10-CM | POA: Diagnosis not present

## 2018-05-14 ENCOUNTER — Other Ambulatory Visit: Payer: Self-pay | Admitting: Adult Health

## 2018-05-14 NOTE — Progress Notes (Signed)
Office: [phone]  /  Fax: [phone]   HPI:   Chief Complaint: OBESITY Toni [name] is here to discuss her progress with her obesity treatment plan. She is on the  follow the Category 2 plan and is following her eating plan approximately 100 % of the time. She states she is exercising 15-60 minutes 2-6 times per week. Toni [name] continues to do well with weight loss on Cat 2 plan. She found journaling to be time consuming but liked the freedom she had with it. She is doing physical therapy for exercise and feels well over all.  Her weight is 204 lb (92.5 kg) today and has had a weight loss of 5 pounds over a period of 3 weeks since her last visit. She has lost 26 lbs since starting treatment with [name].  Hypertension Toni [name] is a 72 y.o. female with hypertension.  Toni [name] denies chest pain or shortness of breath on exertion. She is working weight loss to help control her blood pressure with the goal of decreasing her risk of heart attack and stroke. Toni [name] blood pressure is currently controlled. Patient on multiple medications for her blood pressure and she notes her blood pressure has decreased with recent increase in her Diltiazem for Afib. She has an appointment with her urologist and cardiologist later this week.    ALLERGIES: Allergies  Allergen Reactions  . Hydrocodone Nausea And Vomiting    Hydrocodone causes vomiting  . Oxycodone Nausea And Vomiting    Oral oxycodone causes vomiting  . Chlorhexidine Gluconate Rash    Sores at site  . Zoloft [Sertraline] Hives, Swelling and Rash    MEDICATIONS: Current Outpatient Medications on File Prior to Visit  Medication Sig Dispense Refill  . acetaminophen (TYLENOL) 500 MG tablet Take 500-1,000 mg by mouth every 6 (six) hours as needed for fever or headache (or pain).    Marland Kitchen ALPRAZolam (XANAX) 0.5 MG tablet Take 1 tablet (0.5 mg total) by mouth 2 (two) times daily as needed for anxiety. 60 tablet 0  . baclofen  (LIORESAL) 20 MG tablet Take 1 tablet (20 mg total) by mouth 3 (three) times daily as needed for muscle spasms. (Patient taking differently: Take 20 mg by mouth See admin instructions. Once to twice a day for breakthrough spasms) 180 each 3  . Biotin (PA BIOTIN) 1000 MCG tablet Take 1,000 mcg by mouth daily.    Marland Kitchen buPROPion (WELLBUTRIN XL) 300 MG 24 hr tablet Take 1 tablet (300 mg total) by mouth daily. 30 tablet 0  . CALCIUM CITRATE PO Take 2 tablets by mouth 2 (two) times daily.    . cephALEXin (KEFLEX) 500 MG capsule Take 500 mg by mouth 3 (three) times daily.    . cetirizine (ZYRTEC) 10 MG tablet Take 10 mg by mouth daily.    . Cholecalciferol (VITAMIN D) 2000 units tablet Take 1 tablet (2,000 Units total) by mouth 2 (two) times daily.  0  . co-enzyme Q-10 30 MG capsule Take 30 mg by mouth daily.    . Cranberry 1000 MG CAPS Take by mouth.    . diltiazem (CARDIZEM CD) 180 MG 24 hr capsule TAKE 1 CAPSULE(180 MG) BY MOUTH DAILY 90 capsule 0  . docusate sodium (COLACE) 100 MG capsule Take 1 capsule (100 mg total) by mouth daily. 10 capsule 0  . fluticasone (FLONASE) 50 MCG/ACT nasal spray Place 2 sprays into both nostrils daily as needed for allergies.     . furosemide (LASIX) 40 MG tablet  Take 2 tablets (80mg ) by mouth in the morning and 1 tablet (40mg ) in the afternoon    . gabapentin (NEURONTIN) 300 MG capsule TAKE 1 CAPSULE BY MOUTH UP  TO 4 TIMES DAILY 360 capsule 3  . GENERLAC 10 GM/15ML SOLN TAKE 15 MLS BY MOUTH TWICE DAILY AS NEEDED 946 mL 0  . HP ACTHAR 80 UNIT/ML injectable gel INJECT 80 UNITS (1ML) SUBCUTANEOUSLY EVERY OTHER DAY FOR A TOTAL OF 10 DAYS. REPEAT EVERY 30 DAYS. (DISCARD 28 DAYS AFTER 1ST USE) 5 mL 5  . levothyroxine (SYNTHROID, LEVOTHROID) 75 MCG tablet Take 75 mcg by mouth daily.    . Multiple Vitamins-Minerals (CENTRUM SILVER PO) Take 1 tablet by mouth daily.    . Nebivolol HCl (BYSTOLIC) 20 MG TABS Take 1 tablet (20 mg total) by mouth daily. 30 tablet   . Omega-3 Fatty  Acids (FISH OIL) 1000 MG CAPS Take 1,000 mg by mouth daily.    Marland Kitchen omeprazole (PRILOSEC) 20 MG capsule Take 20 mg by mouth daily.     . psyllium (METAMUCIL SMOOTH TEXTURE) 28 % packet Take 1 packet by mouth every other day.    . ranitidine (ZANTAC) 150 MG tablet Take 150 mg by mouth at bedtime.     . simvastatin (ZOCOR) 10 MG tablet     . SYRINGE/NEEDLE, DISP, 1 ML (BD LUER-LOK SYRINGE) 25G X 5/8" 1 ML MISC Use as directed to inject  Acthar 10 each prn  . Tamsulosin HCl (FLOMAX) 0.4 MG CAPS Take 0.4 mg by mouth daily.    Marland Kitchen UNABLE TO FIND Baclofen pump: Continuous    . warfarin (COUMADIN) 5 MG tablet Take 1 tablet by mouth daily as directed by coumadin clinic. 90 tablet 1   Current Facility-Administered Medications on File Prior to Visit  Medication Dose Route Frequency Provider Last Rate Last Dose  . baclofen (LIORESAL) intrathecal injection 40 mg/62mL  40 mg Intrathecal Once Kathrynn Ducking, MD        PAST MEDICAL HISTORY: Past Medical History:  Diagnosis Date  . Abnormality of gait 03/01/2015  . Anxiety   . Arthritis    "knees" (01/19/2016)  . Atrial fibrillation with RVR ([place]) 01/19/2016  . Back pain   . Chest pain   . Childhood asthma   . Chronic pain    "nerve pain from the MS"  . Constipation   . Depression   . DVT (deep venous thrombosis) ([place]) 1983   "RLE; may have just been phlebitis; it was before the age of dopplers"  . 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 GERD (gastroesophageal reflux disease)   . History of stress test 06/21/10   limited exam with some degree of breast attenuatin of the apex however a component of apical ischemia is not excluded, EF 62%; cardiac cath   . HLD (hyperlipidemia)   . Hx of echocardiogram 04/22/10   EF 55-60%, no valve issues  . Hypertension   . Hypothyroidism   . Joint pain   . Migraine    "none since Toni [name] stopped beta blocker in 1990s" (01/19/2016)  . Multiple sclerosis ([place])    has  had this may 1989-[place] reg doc  . OSA on CPAP   . Osteoarthritis   . PAF (paroxysmal atrial fibrillation) ([place])    on coumadin; documented on monitor 06/2010  . Pneumonia 11/2011  . Shortness of breath   . Thyroid disease  PAST SURGICAL HISTORY: Past Surgical History:  Procedure Laterality Date  . ANTERIOR CERVICAL DECOMP/DISCECTOMY FUSION  01/2004  . BACK SURGERY    . CARDIAC CATHETERIZATION  06/22/2010   normal coronary arteries, PAF  . CATARACT EXTRACTION, BILATERAL Bilateral   . CHILD BIRTHS     X2  . DILATION AND CURETTAGE OF UTERUS    . INFUSION PUMP IMPLANTATION  ~ 2009   baclofen infusion in lower abd  . PAIN PUMP REVISION N/A 07/29/2014   Procedure: Baclofen pump replacement;  Surgeon: Erline Levine, MD;  Location: [facility];  Service: Neurosurgery;  Laterality: N/A;  Baclofen pump replacement  . TONSILLECTOMY AND ADENOIDECTOMY    . VARICOSE VEIN SURGERY  ~ 1968  . VENOUS ABLATION  12/16/2010   radiofreq ablation -Dr Elisabeth Cara and [facility]  . WISDOM TOOTH EXTRACTION      SOCIAL HISTORY: Social History   Tobacco Use  . Smoking status: Former Smoker    Packs/day: 0.50    Years: 7.00    Pack years: 3.50    Types: Cigarettes    Last attempt to quit: 08/21/1976    Years since quitting: 41.7  . Smokeless tobacco: Never Used  Substance Use Topics  . Alcohol use: Yes    Alcohol/week: 1.0 standard drinks    Types: 1 Glasses of wine per week  . Drug use: No    FAMILY HISTORY: Family History  Problem Relation Age of Onset  . Coronary artery disease Father        at age 74  . Hyperlipidemia Father   . Thyroid disease Father   . Coronary artery disease Maternal Grandmother   . Depression Maternal Grandmother   . Cancer Paternal Grandmother   . Depression Mother   . Sudden death Mother   . Anxiety disorder Mother   . Alcoholism Son     ROS: Review of Systems  Constitutional: Positive for weight loss.  All other systems reviewed and are  negative.   PHYSICAL EXAM: Blood pressure 95/62, pulse 84, temperature 98.2 F (36.8 C), temperature source Oral, height 5\' 3"  (1.6 m), weight 204 lb (92.5 kg), SpO2 96 %. Body mass index is 36.14 kg/m. Physical Exam  Constitutional: She is oriented to person, place, and time. She appears well-developed and well-nourished.  Eyes: EOM are normal.  Neck: Normal range of motion.  Pulmonary/Chest: Effort normal.  Musculoskeletal: Normal range of motion.  Neurological: She is alert and oriented to person, place, and time.  Skin: Skin is warm and dry.  Psychiatric: She has a normal mood and affect. Her behavior is normal.    RECENT LABS AND TESTS: BMET    Component Value Date/Time   NA 144 04/23/2018 1159   K 4.2 04/23/2018 1159   CL 95 (L) 04/23/2018 1159   CO2 30 (H) 04/23/2018 1159   GLUCOSE 45 (L) 04/23/2018 1159   GLUCOSE 92 07/08/2017 1214   BUN 26 04/23/2018 1159   CREATININE 0.69 04/23/2018 1159   CALCIUM 9.5 04/23/2018 1159   GFRNONAA 88 04/23/2018 1159   GFRAA 102 04/23/2018 1159   Lab Results  Component Value Date   HGBA1C 5.3 04/23/2018   HGBA1C 5.2 10/01/2017   HGBA1C 5.4 03/08/2017   HGBA1C  04/21/2010    5.5 (NOTE)  According to the ADA Clinical Practice Recommendations for 2011, when HbA1c is used as a screening test:   >=6.5%   Diagnostic of Diabetes Mellitus           (if abnormal result  is confirmed)  5.7-6.4%   Increased risk of developing Diabetes Mellitus  References:Diagnosis and Classification of Diabetes Mellitus,Diabetes KKXF,8182,99(BZJIR 1):S62-S69 and Standards of Medical Care in         Diabetes - 2011,Diabetes CVEL,3810,17  (Suppl 1):S11-S61.   Lab Results  Component Value Date   INSULIN 7.8 04/23/2018   INSULIN 20.4 11/27/2017   INSULIN 20.4 03/08/2017   CBC    Component Value Date/Time   WBC 7.0 10/01/2017   WBC 8.4 07/08/2017 1214   RBC 4.83 07/08/2017 1214   HGB  15.1 10/01/2017   HGB 14.1 03/08/2017 1152   HCT 47 (A) 10/01/2017   HCT 43.0 03/08/2017 1152   PLT 185 10/01/2017   PLT 219 12/18/2014 1157   MCV 93.2 07/08/2017 1214   MCV 93 03/08/2017 1152   MCH 31.5 07/08/2017 1214   MCHC 33.8 07/08/2017 1214   RDW 13.3 07/08/2017 1214   RDW 13.3 03/08/2017 1152   LYMPHSABS 1.6 07/08/2017 1214   LYMPHSABS 2.2 03/08/2017 1152   MONOABS 1.3 (H) 07/08/2017 1214   EOSABS 0.2 07/08/2017 1214   EOSABS 0.1 03/08/2017 1152   BASOSABS 0.0 07/08/2017 1214   BASOSABS 0.0 03/08/2017 1152   Iron/TIBC/Ferritin/ %Sat No results found for: IRON, TIBC, FERRITIN, IRONPCTSAT Lipid Panel     Component Value Date/Time   CHOL 129 11/27/2017 1011   TRIG 60 11/27/2017 1011   HDL 51 11/27/2017 1011   CHOLHDL 3.0 04/22/2010 0536   VLDL 29 04/22/2010 0536   LDLCALC 66 11/27/2017 1011   Hepatic Function Panel     Component Value Date/Time   PROT 6.0 04/23/2018 1159   ALBUMIN 4.0 04/23/2018 1159   AST 22 04/23/2018 1159   ALT 31 04/23/2018 1159   ALKPHOS 62 04/23/2018 1159   BILITOT 0.3 04/23/2018 1159      Component Value Date/Time   TSH 0.651 11/27/2017 1011   TSH 2.410 03/08/2017 1152   TSH 1.083 08/06/2016 0647   TSH 1.871 01/19/2016 2302   TSH 0.206 (L) 03/10/2011 1351    ASSESSMENT AND PLAN: Essential hypertension  Class 2 severe obesity with serious comorbidity and body mass index (BMI) of 36.0 to 36.9 in adult, unspecified obesity type ([place])  PLAN: Hypertension We discussed sodium restriction, working on healthy weight loss, and a regular exercise program as the means to achieve improved blood pressure control. Toni [name] agreed with this plan and agreed to follow up as directed. We will continue to monitor her blood pressure as well as her progress with the above lifestyle modifications. She will continue her medications as prescribed and will watch for signs of hypotension as she continues her lifestyle modifications.Patient asked to  increase her [name] to help with dehydration related to hypotension and to make sure she follows up with her other physicians, hopefully she can start to decrease some of her medications with her 60+ weight loss.   Toni [name] spent > than 50% of the 15 minute visit on counseling as documented in the note.  Obesity Toni [name] is currently in the action stage of change. As such, her goal is to continue with weight loss efforts She has agreed to keep a food journal with 1200 calories and 70 protein or Cat 2. Toni [name] has been instructed to work up  to a goal of 150 minutes of combined cardio and strengthening exercise per week for weight loss and overall health benefits. We discussed the following Behavioral Modification Stratagies today: increasing vegetables, work on meal planning and easy cooking plans and decrease liquid calories   Toni [name] has agreed to follow up with our clinic in 4 weeks. She was informed of the importance of frequent follow up visits to maximize her success with intensive lifestyle modifications for her multiple health conditions.   OBESITY BEHAVIORAL INTERVENTION VISIT  Today's visit was # 22  Starting weight: 257 lb Starting date: 03/08/17 Today's weight : Weight: 204 lb (92.5 kg)  Today's date: 05/13/18 Total lbs lost to date: 32    ASK: We discussed the diagnosis of obesity with Toni [name] today and Toni [name] agreed to give [name] permission to discuss obesity behavioral modification therapy today.  ASSESS: Neomi has the diagnosis of obesity and her BMI today is @TBMI @ Tameeka is in the action stage of change   ADVISE: Khristi was educated on the multiple health risks of obesity as well as the benefit of weight loss to improve her health. She was advised of the need for long term treatment and the importance of lifestyle modifications to improve her current health and to decrease her risk of future health problems.  AGREE: Multiple dietary modification  options and treatment options were discussed and  Maekayla agreed to follow the recommendations documented in the above note.  ARRANGE: Latifah was educated on the importance of frequent visits to treat obesity as outlined per CMS and USPSTF guidelines and agreed to schedule her next follow up appointment today.  Toni [name], Toni [name] , am acting as transcriptionist for Dr Dennard Nip  Toni [name] have reviewed the above documentation for accuracy and completeness, and Toni [name] agree with the above. -Dennard Nip, MD

## 2018-05-15 DIAGNOSIS — M25669 Stiffness of unspecified knee, not elsewhere classified: Secondary | ICD-10-CM | POA: Diagnosis not present

## 2018-05-15 DIAGNOSIS — R262 Difficulty in walking, not elsewhere classified: Secondary | ICD-10-CM | POA: Diagnosis not present

## 2018-05-15 DIAGNOSIS — Z9181 History of falling: Secondary | ICD-10-CM | POA: Diagnosis not present

## 2018-05-15 DIAGNOSIS — M6281 Muscle weakness (generalized): Secondary | ICD-10-CM | POA: Diagnosis not present

## 2018-05-16 ENCOUNTER — Ambulatory Visit (INDEPENDENT_AMBULATORY_CARE_PROVIDER_SITE_OTHER): Payer: Medicare Other | Admitting: Pharmacist Clinician (PhC)/ Clinical Pharmacy Specialist

## 2018-05-16 ENCOUNTER — Ambulatory Visit (INDEPENDENT_AMBULATORY_CARE_PROVIDER_SITE_OTHER): Payer: Medicare Other | Admitting: Internal Medicine

## 2018-05-16 ENCOUNTER — Encounter: Payer: Self-pay | Admitting: Internal Medicine

## 2018-05-16 VITALS — BP 111/63 | HR 75 | Ht 63.0 in | Wt 204.0 lb

## 2018-05-16 DIAGNOSIS — Z7901 Long term (current) use of anticoagulants: Secondary | ICD-10-CM | POA: Diagnosis not present

## 2018-05-16 DIAGNOSIS — I4821 Permanent atrial fibrillation: Secondary | ICD-10-CM

## 2018-05-16 DIAGNOSIS — I5032 Chronic diastolic (congestive) heart failure: Secondary | ICD-10-CM | POA: Diagnosis not present

## 2018-05-16 DIAGNOSIS — I482 Chronic atrial fibrillation: Secondary | ICD-10-CM

## 2018-05-16 LAB — POCT INR: INR: 2.2 (ref 2.0–3.0)

## 2018-05-16 MED ORDER — DILTIAZEM HCL ER COATED BEADS 180 MG PO CP24: ORAL_CAPSULE | ORAL | 3 refills | Status: DC

## 2018-05-16 NOTE — Progress Notes (Signed)
OFFICE NOTE  Chief Complaint:  Weight loss, occasional dizziness  Primary Care Physician: Prince Solian, MD  HPI:  Toni [name] is a 72 year old female with multiple sclerosis, paroxysmal atrial fibrillation, on Coumadin, morbid obesity, obstructive sleep apnea, and dyslipidemia. She has had worsening swelling in her legs, thought to be due to an MS flare. She has a history of varicose veins with severe venous insufficiency in both the right and left greater saphenous veins. The right greater saphenous vein was actually successfully ablated by my partner, Dr. Elisabeth Cara, about 1 to 2 years ago. She does have residual reflux in the left greater saphenous vein extending from the proximal calf up to the saphenofemoral junction. The vein measured between 4 and 8 mm with between 1 and 6 seconds of reflux. We had discussed ablation of that before, but she has canceled several times at procedures. At this point, I think there is little benefit to continuing to try for her to undergo ablation. 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 She can wear work showed a compression stockings and this does seem to help her. With regards to her atrial fibrillation she has not had significant recurrence to her knowledge. She continues on warfarin and has been therapeutic, with her INR of 1.9 in the office by fingerstick today.  Toni [name] returns today for followup. She reports her MS is somewhat progressive. She continues to have some enlargement of her legs but no significant swelling. There is a mild varicosities in the right lower extremity. She is unaware of her atrial fibrillation. INR today was 2.3.   I saw Toni [name] back in the office today,  She is describing more shortness of breath and feels that she's been in A. Fib more recently. In fact today6/21her EKG does show persistent atrial fibrillation. In the past she's been paroxysmal , but it may be that she is  more frequently having A. Fib. She has trouble telling whether not she is more short of breath or symptomatic from A. Fib versus her multiple sclerosis. She is also reporting more swelling in her left leg.  Toni [name] returns for follow-up today. She recently saw her neurologist regarding her MS which seems to be flaring. In addition she was noted to have significant apneic events despite using CPAP. There is concern for possible central sleep apnea. Both of these episodes are probably contributing to her recurrent atrial fibrillation. She's been wearing a monitor for a period of time which indicates a burden of A. fib of about 80% of the time. It does not appear that she is in persistent A. fib, rather she has had some episodes of sinus rhythm, but they're much less frequent than her A. Fib.  I saw Toni [name] back today in the office. Recently I saw her as an add-on visit briefly as she was describing some chest pain when she was getting her INR checked. An EKG at that time showed no ischemia. I did not feel that this was cardiac. She continues intermittently have abnormal pains both in her chest as well as her abdomen and extremities. This may be related to MS. She's had difficulty with CPAP and apparently getting a good mask fit. She has been a persistent atrial fibrillation for about 6 months. It's unclear whether or not she symptomatic with this and therefore I have not pursued a rhythm control strategy. Her rate control is generally pretty good and she has been  therapeutic on warfarin. She is reporting worsening swelling of her legs although her weight she says is been stable. She has a history of venous insufficiency and had a right greater saphenous vein ablation by one of my partners. She's concerned about more swelling in her left leg and whether she's developed worsening venous insufficiency. She also reports cold feet with numbness in her extremities. As she is basically wheelchair bound, I cannot  assess whether she is having any claudication.  Toni [name] returns today for follow-up. She underwent venous and arterial lower external Dopplers. Arterial Dopplers were normal which showed no evidence of obstruction. Her venous Doppler showed no DVT. There was significant venous reflux in the left greater saphenous vein. This is an area where she has swelling and discomfort. This may be amenable to treatment. I increased her Lasix and she reports some improvement in her leg swelling. She's trying to elevate her feet and reports she is intolerant of wearing compression stockings.  02/03/2016  Toni [name] was seen today in the office for follow-up of recent hospitalization. She was seeing her neurologist and complained of left chest and left arm pain and was referred to the hospital. She ruled out for MI and was felt that her chest pain was on coronary. She underwent an echocardiogram which showed low-normal EF but was also found to be in A. fib with rapid ventricular response. Her A. fib is persistent at this time but the rate was elevated. She was started on Cardizem which helped improve her rate and was discharged on Cardizem CD 120 mg daily. Heart rate today is better controlled in the 70s. She reports her arm pain is improved and she said today that she "did not feel that it was her heart".  05/09/2016  Toni [name] was seen in the office for follow-up today. She reports improved heart rate control with heart rates between 50 and 80 on diltiazem. Over this will help with her mild cardiomyopathy with EF 50-55% on recent echo. She reports some increased fluid gain particularly in her abdomen. This may be due to some degree of right heart dysfunction. She's working with her neurologist for MS as well as her sleep apnea. There is a component of central sleep apnea and she is reportedly going to have additional sleep study to look for the need for BiPAP therapy. She denies any recurrent chest pain or left arm  pain.  02/14/2017  Toni [name] was seen today in follow-up. She was recently hospitalized. She said problems with increasing fluid gain. I increased her Lasix last time however when she was hospitalized she required high-dose Lasix and says she has she felt better with that. She subsequently been put on BiPAP therapy. She says her sleeping is improved significantly. She is concerned though recently she's had some increased weight gain. She feels like she may need an increasing dose of diuretic. Her last echo was in December 2017 which showed an EF of 55%. This is been stable. If this is some heart failure, may be related diastolic dysfunction however suspect is more likely related to autonomic dysfunction the setting of multiple sclerosis.  03/29/2017  Toni [name] returns today for follow-up. She continues to report lower extremity edema and abdominal swelling. We'll increase her Lasix however she has not noted significant weight loss. It is actually very difficult to estimate her weight because of her MS. We tried to place her on the scale today however she was unable to stand long enough to get  an accurate weight. She reports urination was not as good on 40 mg of Lasix but is currently taking 80 mg once daily and has better urination with that.  05/18/2017  Toni [name] was seen today in follow-up. She reports some improvement in her abdominal swelling and bloating. Check she feels like she might be somewhat dry now. Her weight is down to 246 pounds from 253 pounds. She is also working with the weight loss clinic at [facility]. She is interested in decreasing her diuretic which is reasonable. She asked again about probably trying to get back into sinus rhythm however she's been in persistent A. fib which is long-standing at this point for more than one or 2 years. Her last echo in December 2017 showed mild left atrial enlargement. Is not clear that she's symptomatic with her A. fib although does feel tired at  times. She says she's had some improvement with her sleep apnea therapy.  01/15/2018  Toni [name] returns today for follow-up.  She is continued to lose some weight.  Weight is down now to 218 pounds, significant improvement from her prior weight over 250 pounds.  She also had marked reduction in edema.  Her blood pressure is low normal at the moment and may benefit from reduction in her blood pressure medications.  Her sleep apnea has improved significantly according to Dr. Brett Fairy, especially her obstructive symptoms.  She still has central sleep apnea.  In addition she has secondary progressive MS and recently had worsening symptoms associated with that.  Heart rate was elevated today with regards to A. fib however that did come down after short period of time.  She again appears to be asymptomatic with her A. fib.  The main reason not to pursue sinus rhythm is the fact that she has been persistently in A. fib for well over 5 years, and the likelihood of reestablishing sinus rhythm is exceedingly low.  05/16/2018  Toni [name] is seen today in follow-up.  Her weight is down even further today to 204.  She has had recently some positional dizziness and low blood pressures.  This is likely due to the significant weight loss and I would recommend a decrease in her medications.  She says that she is breathing better.  Her sleep apnea has improved significantly according to her sleep doctor and she has having less symptoms related to her MS.  She is in permanent A. fib with controlled ventricular response.  PMHx:  Past Medical History:  Diagnosis Date  . Abnormality of gait 03/01/2015  . Anxiety   . Arthritis    "knees" (01/19/2016)  . Atrial fibrillation with RVR ([place]) 01/19/2016  . Back pain   . Chest pain   . Childhood asthma   . Chronic pain    "nerve pain from the MS"  . Constipation   . Depression   . DVT (deep venous thrombosis) ([place]) 1983   "RLE; may have just been phlebitis; it was before the  age of dopplers"  . 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 GERD (gastroesophageal reflux disease)   . History of stress test 06/21/10   limited exam with some degree of breast attenuatin of the apex however a component of apical ischemia is not excluded, EF 62%; cardiac cath   . HLD (hyperlipidemia)   . Hx of echocardiogram 04/22/10   EF 55-60%, no valve issues  . Hypertension   .  Hypothyroidism   . Joint pain   . Migraine    "none since I stopped beta blocker in 1990s" (01/19/2016)  . Multiple sclerosis ([place])    has had this may 1989-[facility] reg doc  . OSA on CPAP   . Osteoarthritis   . PAF (paroxysmal atrial fibrillation) ([place])    on coumadin; documented on monitor 06/2010  . Pneumonia 11/2011  . Shortness of breath   . Thyroid disease     Past Surgical History:  Procedure Laterality Date  . ANTERIOR CERVICAL DECOMP/DISCECTOMY FUSION  01/2004  . BACK SURGERY    . CARDIAC CATHETERIZATION  06/22/2010   normal coronary arteries, PAF  . CATARACT EXTRACTION, BILATERAL Bilateral   . CHILD BIRTHS     X2  . DILATION AND CURETTAGE OF UTERUS    . INFUSION PUMP IMPLANTATION  ~ 2009   baclofen infusion in lower abd  . PAIN PUMP REVISION N/A 07/29/2014   Procedure: Baclofen pump replacement;  Surgeon: Erline Levine, MD;  Location: [place] [facility];  Service: Neurosurgery;  Laterality: N/A;  Baclofen pump replacement  . TONSILLECTOMY AND ADENOIDECTOMY    . VARICOSE VEIN SURGERY  ~ 1968  . VENOUS ABLATION  12/16/2010   radiofreq ablation -Dr Elisabeth Cara and [facility]  . WISDOM TOOTH EXTRACTION      FAMHx:  Family History  Problem Relation Age of Onset  . Coronary artery disease Father        at age 83  . Hyperlipidemia Father   . Thyroid disease Father   . Coronary artery disease Maternal Grandmother   . Depression Maternal Grandmother   . Cancer Paternal Grandmother   . Depression Mother   . Sudden death Mother   . Anxiety disorder Mother    . Alcoholism Son     SOCHx:   reports that she quit smoking about 41 years ago. Her smoking use included cigarettes. She has a 3.50 pack-year smoking history. She has never used smokeless tobacco. She reports that she drinks about 1.0 standard drinks of alcohol per week. She reports that she does not use drugs.  ALLERGIES:  Allergies  Allergen Reactions  . Hydrocodone Nausea And Vomiting    Hydrocodone causes vomiting  . Oxycodone Nausea And Vomiting    Oral oxycodone causes vomiting  . Chlorhexidine Gluconate Rash    Sores at site  . Zoloft [Sertraline] Hives, Swelling and Rash    ROS: Pertinent items noted in HPI and remainder of comprehensive ROS otherwise negative.  HOME MEDS: Current Outpatient Medications  Medication Sig Dispense Refill  . acetaminophen (TYLENOL) 500 MG tablet Take 500-1,000 mg by mouth every 6 (six) hours as needed for fever or headache (or pain).    Marland Kitchen ALPRAZolam (XANAX) 0.5 MG tablet Take 1 tablet (0.5 mg total) by mouth 2 (two) times daily as needed for anxiety. 60 tablet 0  . baclofen (LIORESAL) 20 MG tablet Take 1 tablet (20 mg total) by mouth 3 (three) times daily as needed for muscle spasms. (Patient taking differently: Take 20 mg by mouth See admin instructions. Once to twice a day for breakthrough spasms) 180 each 3  . Biotin (PA BIOTIN) 1000 MCG tablet Take 1,000 mcg by mouth daily.    Marland Kitchen buPROPion (WELLBUTRIN XL) 300 MG 24 hr tablet Take 1 tablet (300 mg total) by mouth daily. 30 tablet 0  . CALCIUM CITRATE PO Take 2 tablets by mouth 2 (two) times daily.    . cetirizine (ZYRTEC) 10 MG tablet Take 10 mg  by mouth daily.    . Cholecalciferol (VITAMIN D) 2000 units tablet Take 1 tablet (2,000 Units total) by mouth 2 (two) times daily.  0  . co-enzyme Q-10 30 MG capsule Take 30 mg by mouth daily.    . Cranberry 1000 MG CAPS Take by mouth.    . diltiazem (CARDIZEM CD) 180 MG 24 hr capsule TAKE 1 CAPSULE(180 MG) BY MOUTH DAILY 90 capsule 0  . docusate  sodium (COLACE) 100 MG capsule Take 1 capsule (100 mg total) by mouth daily. 10 capsule 0  . fluticasone (FLONASE) 50 MCG/ACT nasal spray Place 2 sprays into both nostrils daily as needed for allergies.     . furosemide (LASIX) 40 MG tablet Take 2 tablets (80mg ) by mouth in the morning and 1 tablet (40mg ) in the afternoon    . gabapentin (NEURONTIN) 300 MG capsule TAKE 1 CAPSULE BY MOUTH UP  TO 4 TIMES DAILY 360 capsule 3  . GENERLAC 10 GM/15ML SOLN TAKE 15 MLS BY MOUTH TWICE DAILY AS NEEDED 946 mL 0  . HP ACTHAR 80 UNIT/ML injectable gel INJECT 80 UNITS (1ML) SUBCUTANEOUSLY EVERY OTHER DAY FOR A TOTAL OF 10 DAYS. REPEAT EVERY 30 DAYS. (DISCARD 28 DAYS AFTER 1ST USE) 5 mL 5  . levothyroxine (SYNTHROID, LEVOTHROID) 75 MCG tablet Take 75 mcg by mouth daily.    . Multiple Vitamins-Minerals (CENTRUM SILVER PO) Take 1 tablet by mouth daily.    . Nebivolol HCl (BYSTOLIC) 20 MG TABS Take 1 tablet (20 mg total) by mouth daily. 30 tablet   . Omega-3 Fatty Acids (FISH OIL) 1000 MG CAPS Take 1,000 mg by mouth daily.    Marland Kitchen omeprazole (PRILOSEC) 20 MG capsule Take 20 mg by mouth daily.     . psyllium (METAMUCIL SMOOTH TEXTURE) 28 % packet Take 1 packet by mouth every other day.    . ranitidine (ZANTAC) 150 MG tablet Take 150 mg by mouth at bedtime.     . simvastatin (ZOCOR) 10 MG tablet     . SYRINGE/NEEDLE, DISP, 1 ML (BD LUER-LOK SYRINGE) 25G X 5/8" 1 ML MISC Use as directed to inject  Acthar 10 each prn  . Tamsulosin HCl (FLOMAX) 0.4 MG CAPS Take 0.4 mg by mouth daily.    Marland Kitchen UNABLE TO FIND Baclofen pump: Continuous    . warfarin (COUMADIN) 5 MG tablet Take 1 tablet by mouth daily as directed by coumadin clinic. 90 tablet 1   Current Facility-Administered Medications  Medication Dose Route Frequency Provider Last Rate Last Dose  . baclofen (LIORESAL) intrathecal injection 40 mg/34mL  40 mg Intrathecal Once Kathrynn Ducking, MD        LABS/IMAGING: Results for orders placed or performed in visit on  04/25/18 (from the past 48 hour(s))  POCT INR     Status: None   Collection Time: 05/16/18  2:24 PM  Result Value Ref Range   INR 2.2 2.0 - 3.0   No results found.  VITALS: BP 111/63   Pulse 75   Ht 5\' 3"  (1.6 m)   Wt 204 lb (92.5 kg)   BMI 36.14 kg/m   EXAM: General appearance: alert, no distress and moderately obese Neck: no carotid bruit, no JVD and thyroid not enlarged, symmetric, no tenderness/mass/nodules Lungs: clear to auscultation bilaterally Heart: irregularly irregular rhythm Abdomen: soft, non-tender; bowel sounds normal; no masses,  no organomegaly Extremities: edema Trace lower extremity Pulses: 2+ and symmetric Skin: Skin color, texture, turgor normal. No rashes or lesions Neurologic: Mental  status: Alert, oriented, thought content appropriate Psych: Pleasant  EKG: Permanent A. fib with controlled ventricular response at 76-personally reviewed  ASSESSMENT: 1. Permanent atrial fibrillation - 80% frequency of A. Fib 2. Morbid obesity 3. Obstructive sleep apnea on BIPAP 4. Multiple sclerosis 5. Chronic anticoagulation on warfarin - INR 2.3 today 6. Chronic venous insufficiency - +LGSV reflux 7. Hypertension-controlled 8. Noncardiac chest and left arm pain 9. Non-ischemic cardiomyopathy - EF 50%, improved to 55% (07/2016) 10. Acute on chronic diastolic congestive heart failure  PLAN: 1.   Mrs. Wisby has what is thought to be permanent atrial fibrillation at this point with good control of her ventricular response.  Recently has had significant weight loss with help with the weight loss clinic.  Based on this she has had some lower blood pressures and will need a reduction in her medication.  I recommended decreasing the Bystolic dose from 20 mg to 10 mg daily.  We will also decrease her Lasix from 120 mg total daily to 80 mg once daily.  Plan follow-up in 6 months or sooner as necessary.  Pixie Casino, MD, [facility], [place] Director of the Advanced Lipid Disorders &  Cardiovascular Risk Reduction Clinic Diplomate of the American Board of Clinical Lipidology Attending Cardiologist  Direct Dial: [phone]  Fax: [phone]  Website:  [email or web address]   Nadean Corwin Josh Nicolosi 05/16/2018, 2:28 PM

## 2018-05-16 NOTE — Patient Instructions (Addendum)
Your physician has recommended you make the following change in your medication:  -- decrease lasix to 80mg  daily -- decrease bystolic to 10mg  daily  Your physician wants you to follow-up in: 6 months with Dr. Debara Pickett. You will receive a reminder letter in the mail two months in advance. If you don't receive a letter, please call our office to schedule the follow-up appointment.

## 2018-05-17 ENCOUNTER — Encounter: Payer: Self-pay | Admitting: Neurology

## 2018-05-17 DIAGNOSIS — Q078 Other specified congenital malformations of nervous system: Secondary | ICD-10-CM | POA: Diagnosis not present

## 2018-05-17 DIAGNOSIS — G35 Multiple sclerosis: Secondary | ICD-10-CM | POA: Diagnosis not present

## 2018-05-17 DIAGNOSIS — H53413 Scotoma involving central area, bilateral: Secondary | ICD-10-CM | POA: Diagnosis not present

## 2018-05-17 DIAGNOSIS — H04123 Dry eye syndrome of bilateral lacrimal glands: Secondary | ICD-10-CM | POA: Diagnosis not present

## 2018-05-17 DIAGNOSIS — H3589 Other specified retinal disorders: Secondary | ICD-10-CM | POA: Diagnosis not present

## 2018-05-17 DIAGNOSIS — Z79899 Other long term (current) drug therapy: Secondary | ICD-10-CM | POA: Diagnosis not present

## 2018-05-17 DIAGNOSIS — Z961 Presence of intraocular lens: Secondary | ICD-10-CM | POA: Diagnosis not present

## 2018-05-19 ENCOUNTER — Other Ambulatory Visit: Payer: Self-pay | Admitting: Adult Health

## 2018-05-20 ENCOUNTER — Encounter: Payer: Self-pay | Admitting: Neurology

## 2018-05-20 ENCOUNTER — Ambulatory Visit (INDEPENDENT_AMBULATORY_CARE_PROVIDER_SITE_OTHER): Payer: Medicare Other | Admitting: Neurology

## 2018-05-20 ENCOUNTER — Telehealth: Payer: Self-pay | Admitting: Neurology

## 2018-05-20 ENCOUNTER — Other Ambulatory Visit: Payer: Self-pay

## 2018-05-20 VITALS — BP 108/68 | HR 72 | Ht 63.0 in | Wt 204.0 lb

## 2018-05-20 DIAGNOSIS — G35 Multiple sclerosis: Secondary | ICD-10-CM

## 2018-05-20 MED ORDER — LACTULOSE ENCEPHALOPATHY 10 GM/15ML PO SOLN: ORAL | 3 refills | Status: DC

## 2018-05-20 MED ORDER — BACLOFEN 40 MG/20ML IT SOLN
80.0000 mg | Freq: Once | INTRATHECAL | Status: AC
  Administered 2018-05-20: 80 mg via INTRATHECAL

## 2018-05-20 NOTE — Telephone Encounter (Signed)
I see from drug hx that we have prescribed.  Continue to go to pcp?

## 2018-05-20 NOTE — Telephone Encounter (Signed)
This was orginally prescribed in 2017 by Dr. Brett Fairy. Looks like it was refilled in August. Since Dr. Brett Fairy saw her last she will need to decide if this is something we will continue to fill.

## 2018-05-20 NOTE — Procedures (Signed)
     History:  Toni [name] is a 72 year old patient with multiple sclerosis with spastic paraparesis.  The patient indicates that the level spasticity has been well controlled recently, she comes in for a routine baclofen pump refill.  Baclofen pump refill note  The baclofen pump site was cleaned with Betadine solution. A 21-gauge needle was inserted into the pump port site. Approximately 7 cc of residual baclofen was removed, the pump indicates a 3.3 cc volume residual. 40 cc of replacement baclofen was placed into the pump at 2000 mcg/cc concentration.  The pump was reprogrammed for the following settings: The settings were kept at 700.3 mcg/day, simple continuous rate.  The alarm volume is set at 1.5 cc. The next alarm date is September 06, 2018.  The patient tolerated the procedure well. There were no complications of the above procedure.  The [facility] number is [phone] The baclofen expiration date is October 2021. The baclofen lot number is [id number].

## 2018-05-20 NOTE — Telephone Encounter (Signed)
Pt is out of GENERLAC 10 GM/15ML SOLN. She said the refills are 1 mth supply. Refill request was denied said it was too soon. Please call to advise.

## 2018-05-20 NOTE — Telephone Encounter (Signed)
Refill done for generlac(lactulose) sent to pharmacy, approve by Dr. Brett Fairy.

## 2018-05-20 NOTE — Telephone Encounter (Signed)
Pt called stating she has not heard from anyone re: her need for the refill of her medication.  Pt was told the message was sent to RN earlier and that there has to be time allowed for the RN to process the request.  Pt then ended the call

## 2018-05-23 DIAGNOSIS — M6281 Muscle weakness (generalized): Secondary | ICD-10-CM | POA: Diagnosis not present

## 2018-05-23 DIAGNOSIS — R262 Difficulty in walking, not elsewhere classified: Secondary | ICD-10-CM | POA: Diagnosis not present

## 2018-05-23 DIAGNOSIS — M25669 Stiffness of unspecified knee, not elsewhere classified: Secondary | ICD-10-CM | POA: Diagnosis not present

## 2018-05-23 DIAGNOSIS — Z9181 History of falling: Secondary | ICD-10-CM | POA: Diagnosis not present

## 2018-05-29 DIAGNOSIS — M25669 Stiffness of unspecified knee, not elsewhere classified: Secondary | ICD-10-CM | POA: Diagnosis not present

## 2018-05-29 DIAGNOSIS — M6281 Muscle weakness (generalized): Secondary | ICD-10-CM | POA: Diagnosis not present

## 2018-05-29 DIAGNOSIS — R262 Difficulty in walking, not elsewhere classified: Secondary | ICD-10-CM | POA: Diagnosis not present

## 2018-05-29 DIAGNOSIS — Z9181 History of falling: Secondary | ICD-10-CM | POA: Diagnosis not present

## 2018-05-30 DIAGNOSIS — R262 Difficulty in walking, not elsewhere classified: Secondary | ICD-10-CM | POA: Diagnosis not present

## 2018-05-30 DIAGNOSIS — Z9181 History of falling: Secondary | ICD-10-CM | POA: Diagnosis not present

## 2018-05-30 DIAGNOSIS — M6281 Muscle weakness (generalized): Secondary | ICD-10-CM | POA: Diagnosis not present

## 2018-05-30 DIAGNOSIS — M25669 Stiffness of unspecified knee, not elsewhere classified: Secondary | ICD-10-CM | POA: Diagnosis not present

## 2018-06-03 DIAGNOSIS — M6281 Muscle weakness (generalized): Secondary | ICD-10-CM | POA: Diagnosis not present

## 2018-06-03 DIAGNOSIS — Z9181 History of falling: Secondary | ICD-10-CM | POA: Diagnosis not present

## 2018-06-03 DIAGNOSIS — M25669 Stiffness of unspecified knee, not elsewhere classified: Secondary | ICD-10-CM | POA: Diagnosis not present

## 2018-06-03 DIAGNOSIS — R262 Difficulty in walking, not elsewhere classified: Secondary | ICD-10-CM | POA: Diagnosis not present

## 2018-06-04 ENCOUNTER — Ambulatory Visit (INDEPENDENT_AMBULATORY_CARE_PROVIDER_SITE_OTHER): Payer: Medicare Other | Admitting: Family Medicine

## 2018-06-04 ENCOUNTER — Ambulatory Visit (INDEPENDENT_AMBULATORY_CARE_PROVIDER_SITE_OTHER): Payer: Medicare Other | Admitting: Psychology

## 2018-06-04 VITALS — BP 125/89 | HR 93 | Ht 63.0 in | Wt 203.0 lb

## 2018-06-04 DIAGNOSIS — F3289 Other specified depressive episodes: Secondary | ICD-10-CM

## 2018-06-04 DIAGNOSIS — Z6835 Body mass index (BMI) 35.0-35.9, adult: Secondary | ICD-10-CM

## 2018-06-04 NOTE — Progress Notes (Signed)
Office: [phone]  /  Fax: [phone]   Date: June 04, 2018 Time Seen:12:00pm Duration: 37 minutes Provider: Glennie Isle, Psy.D. Type of Session: Individual Therapy   HPI: Elizabethwas referred by Dr. Lillia Carmel to depression with emotional eating behaviors and was seen for an initial appointment with this provider on April 23, 2018. Per the note for theofficevisit withDr. Desmond Dike April 01, 2018,"Elizabethis struggling with night time eating, especially between 9 and 11 pm. She knows it is emotional eating but can't seem to change her behavior. Toni [name] struggles withemotional eating and using food for comfort to the extent that it is negatively impacting herhealth. Sheoften snacks when sheis not hungry. Elizabethsometimes feels sheis out of control and then feels guilty that shemade poor food choices. Toni [name] been working on behavior modification techniques to help reduce heremotional eating and has been somewhat successful.Sheshows no sign of suicidal or homicidal ideations." Moreover, per the note for the initial visit withDr. Desmond Dike March 08, 2017, "Toni [name] struggled with obesity for years and has been unsuccessful in either losing weight or maintaining long term weight loss." Sheshared shehas been heavy most of herlife andstarted gaining weight in her 1s. Herheaviest weight ever was 301pounds. During that initial appointment, she also endorsed the following:significant food cravings issues; snackingfrequently in the evenings; skippingmeals frequently;frequently drinking liquids with calories;problems with excessive hunger;frequentlyeatinglarger portions than normal;binge eating behaviors; andemotional eating.  During the initial appointment with this provider,Toni [name] shared, "I have real problems in the evening and I don't know why. I don't know if it is physiological or psychological." She described she can go  long periods during the day and not eat, but experiences difficulty in the evening. The aforementioned has been going on Toni [name]'s "whole life." She noted she cannot recall a time where she was "not heavy." Toni [name] denied a history of binge eating, purging behaviors, and other compensatory behaviors. She denied a history of a diagnosis of an eating disorder.Elizabethwas asked to complete a questionnaire assessing various behaviors related to emotional eating. Elizabethendorsed the following: experience food cravings on a regular basis, use food to help you cope with emotional situations, overeat when you are worried about something, overeat frequently when you are bored or lonely and overeat when you are alone, but eat much less when you are with other people. During today's appointment, Toni [name] shared she continues to eat at night, but denied any other episodes of emotional eating.   Session Content: Session focused on the following treatment goal: decrease emotional eating. The session was initiated with the administration of the PHQ-9 and GAD-7, as well as a brief check-in. Toni [name] reported she had two health concerns, including a bladder infection and a yeast infection since the last appointment with this provider. She further shared that she did not "overeat" during her husband's birthday party. Regarding triggers for emotional eating, Toni [name] reported, "I think have celebrations under control." However, she continues to struggle with eating at night; therefore, she reported, "I save up" for night eating. As such, today's appointment focused on Toni [name]'s values to assist in setting goals congruent to her values to subsequently reduce emotional eating in the evenings. Psychoeducation regarding values was provided to Toni [name] and she was led through a values assessment exercise. She identified the following values: family; stimulation; faithfulness; humor; honesty; loving; forgiveness; justice;  monogamy; and caring. She was encouraged to set short and long term goals congruent to her values to assist in living a more fulfilling life. In addition, this provider and Toni [name] set the  goal of engaging in an activity that is stimulating at least three times in the evening (versus eating) between now and the next appointment. She agreed. Toni [name]'s values and the aforementioned goal was written down for Toni [name]. Overall, Toni [name] was receptive to today's session as evidenced by her openness to sharing, responsiveness to feedback, and engagement in the values activity.   Mental Status Examination: Toni [name] arrived on time for the appointment. She presented as appropriately dressed and groomed. Toni [name] appeared her stated age and demonstrated adequate orientation to time, place, person, and purpose of the appointment. She also demonstrated appropriate eye contact. No behavioral peculiarities noted. Toni [name] was observed ambulating with a wheelchair. Her mood was euthymic with congruent affect. Her thought processes were logical, linear, and goal-directed. No hallucinations, delusions, bizarre thinking or behavior reported or observed. Judgment, insight, and impulse control appeared to be grossly intact. There was no evidence of paraphasias (i.e., errors in speech, gross mispronunciations, and word substitutions), repetition deficits, or disturbances in volume or prosody (i.e., rhythm and intonation). There was no evidence of attention or memory impairments. Toni [name] denied current suicidal and homicidal ideation, intent or plan.  Structured Assessment Results: The Patient Health Questionnaire-9 (PHQ-9) is a self-report measure that assesses symptoms and severity of depression over the course of the last two weeks. Toni [name] obtained a score of three suggesting minimal depression. Toni [name] finds the endorsed symptoms to be not difficult at all. Depression screen PHQ 2/9 06/04/2018  Decreased  Interest 0  Down, Depressed, Hopeless 0  PHQ - 2 Score 0  Altered sleeping 0  Tired, decreased energy 1  Change in appetite 0  Feeling bad or failure about yourself  1  Trouble concentrating 0  Moving slowly or fidgety/restless 1  Suicidal thoughts 0  PHQ-9 Score 3  Some recent data might be hidden   The Generalized Anxiety Disorder-7 (GAD-7) is a brief self-report measure that assesses symptoms of anxiety over the course of the last two weeks. Toni [name] obtained a score of one suggesting minimal anxiety. GAD 7 : Generalized Anxiety Score 06/04/2018  Nervous, Anxious, on Edge 0  Control/stop worrying 0  Worry too much - different things 0  Trouble relaxing 0  Restless 0  Easily annoyed or irritable 1  Afraid - awful might happen 0  Total GAD 7 Score 1  Anxiety Difficulty Not difficult at all   Interventions: Toni [name] was administered the PHQ-9 and GAD-7 for symptom monitoring. Content (I.e., triggers for emotional eating) from the last session was reviewed. Throughout today's session, empathic reflections and validation were provided. Psychoeducation regarding values was provided and she was led through a values assessment activity.   DSM-5 Diagnosis: 311 (F32.8) Other Specified Depressive Disorder, Emotional Eating  Treatment Goal & Progress: Nyoka was seen for an initial appointment with this provider on April 23, 2018 during which the following treatment goal was established: decrease emotional eating. Raeven has demonstrated progress in her goal of reducing emotional eating as evidenced by increased awareness of hunger patterns and triggers for emotional eating. In addition, during today's appointment, Toni [name] demonstrated receptiveness to the goal established to help reduce episodes of eating in the evenings.   Plan: Toni [name] continues to appear able and willing to participate as evidenced by engagement in reciprocal conversation, and asking questions for  clarification as appropriate. The next appointment will be scheduled in three weeks per [facility] request, as she wishes to coordinate her appointment with this provider and Dr. Leafy Ro on the same day. The next session will focus  on discussing Kyilee's progress toward the short-term goal established during today's appointment. The next appointment with also focus on the introduction of thought defusion.

## 2018-06-06 DIAGNOSIS — Z9181 History of falling: Secondary | ICD-10-CM | POA: Diagnosis not present

## 2018-06-06 DIAGNOSIS — M6281 Muscle weakness (generalized): Secondary | ICD-10-CM | POA: Diagnosis not present

## 2018-06-06 DIAGNOSIS — M25669 Stiffness of unspecified knee, not elsewhere classified: Secondary | ICD-10-CM | POA: Diagnosis not present

## 2018-06-06 DIAGNOSIS — R262 Difficulty in walking, not elsewhere classified: Secondary | ICD-10-CM | POA: Diagnosis not present

## 2018-06-06 NOTE — Progress Notes (Signed)
Office: [phone]  /  Fax: [phone]   HPI:   Chief Complaint: OBESITY Toni [name] is here to discuss her progress with her obesity treatment plan. She is on the Category 2 plan and is following her eating plan approximately 95 % of the time. She states she is doing physical therapy 60 minutes 4 times per week. Toni [name] continues to do well with weight loss, but she is Toni little discouraged that she hasn't lost weight faster. She has lost over 70 pounds since her highest weight.  Her weight is 203 lb (92.1 kg) today and has had Toni weight loss of 1 pound over Toni period of 3 weeks since her last visit. She has lost 54 lbs since starting treatment with [name].  Depression with emotional eating behaviors Toni [name] is struggling with emotional eating and using food for comfort to the extent that it is negatively impacting her health. She often snacks when she is not hungry. Toni [name] sometimes feels she is out of control and then feels guilty that she made poor food choices. She is working on decreasing emotional eating as well as dealing with her chronic illness (Multiple Sclerosis). She is stable on Wellbutrin.  ALLERGIES: Allergies  Allergen Reactions  . Hydrocodone Nausea And Vomiting    Hydrocodone causes vomiting  . Oxycodone Nausea And Vomiting    Oral oxycodone causes vomiting  . Chlorhexidine Gluconate Rash    Sores at site  . Zoloft [Sertraline] Hives, Swelling and Rash    MEDICATIONS: Current Outpatient Medications on File Prior to Visit  Medication Sig Dispense Refill  . acetaminophen (TYLENOL) 500 MG tablet Take 500-1,000 mg by mouth every 6 (six) hours as needed for fever or headache (or pain).    Marland Kitchen ALPRAZolam (XANAX) 0.5 MG tablet Take 1 tablet (0.5 mg total) by mouth 2 (two) times daily as needed for anxiety. 60 tablet 0  . baclofen (LIORESAL) 20 MG tablet Take 1 tablet (20 mg total) by mouth 3 (three) times daily as needed for muscle spasms. (Patient taking differently:  Take 20 mg by mouth See admin instructions. Once to twice Toni day for breakthrough spasms) 180 each 3  . Biotin (PA BIOTIN) 1000 MCG tablet Take 1,000 mcg by mouth daily.    Marland Kitchen buPROPion (WELLBUTRIN XL) 300 MG 24 hr tablet Take 1 tablet (300 mg total) by mouth daily. 30 tablet 0  . CALCIUM CITRATE PO Take 2 tablets by mouth 2 (two) times daily.    . cetirizine (ZYRTEC) 10 MG tablet Take 10 mg by mouth daily.    . Cholecalciferol (VITAMIN D) 2000 units tablet Take 1 tablet (2,000 Units total) by mouth 2 (two) times daily.  0  . co-enzyme Q-10 30 MG capsule Take 30 mg by mouth daily.    . Cranberry 1000 MG CAPS Take by mouth.    . diltiazem (CARDIZEM CD) 180 MG 24 hr capsule TAKE 1 CAPSULE(180 MG) BY MOUTH DAILY 90 capsule 3  . docusate sodium (COLACE) 100 MG capsule Take 1 capsule (100 mg total) by mouth daily. 10 capsule 0  . fluticasone (FLONASE) 50 MCG/ACT nasal spray Place 2 sprays into both nostrils daily as needed for allergies.     . furosemide (LASIX) 40 MG tablet Take 80 mg by mouth daily.     Marland Kitchen gabapentin (NEURONTIN) 300 MG capsule TAKE 1 CAPSULE BY MOUTH UP  TO 4 TIMES DAILY 360 capsule 3  . GENERLAC 10 GM/15ML SOLN TAKE 15 MLS BY MOUTH TWICE DAILY AS NEEDED  946 mL 0  . HP ACTHAR 80 UNIT/ML injectable gel INJECT 80 UNITS (1ML) SUBCUTANEOUSLY EVERY OTHER DAY FOR Toni TOTAL OF 10 DAYS. REPEAT EVERY 30 DAYS. (DISCARD 28 DAYS AFTER 1ST USE) 5 mL 5  . lactulose, encephalopathy, (GENERLAC) 10 GM/15ML SOLN TAKE 15 MLS BY MOUTH TWICE DAILY AS NEEDED 946 mL 3  . levothyroxine (SYNTHROID, LEVOTHROID) 75 MCG tablet Take 75 mcg by mouth daily.    . Multiple Vitamins-Minerals (CENTRUM SILVER PO) Take 1 tablet by mouth daily.    . nebivolol (BYSTOLIC) 10 MG tablet Take 10 mg by mouth daily.    . Omega-3 Fatty Acids (FISH OIL) 1000 MG CAPS Take 1,000 mg by mouth daily.    Marland Kitchen omeprazole (PRILOSEC) 20 MG capsule Take 20 mg by mouth daily.     . psyllium (METAMUCIL SMOOTH TEXTURE) 28 % packet Take 1 packet by  mouth every other day.    . ranitidine (ZANTAC) 150 MG tablet Take 150 mg by mouth at bedtime.     . simvastatin (ZOCOR) 10 MG tablet     . SYRINGE/NEEDLE, DISP, 1 ML (BD LUER-LOK SYRINGE) 25G X 5/8" 1 ML MISC Use as directed to inject  Acthar 10 each prn  . Tamsulosin HCl (FLOMAX) 0.4 MG CAPS Take 0.4 mg by mouth daily.    Marland Kitchen UNABLE TO FIND Baclofen pump: Continuous    . warfarin (COUMADIN) 5 MG tablet Take 1 tablet by mouth daily as directed by coumadin clinic. 90 tablet 1   Current Facility-Administered Medications on File Prior to Visit  Medication Dose Route Frequency Provider Last Rate Last Dose  . baclofen (LIORESAL) intrathecal injection 40 mg/73mL  40 mg Intrathecal Once Kathrynn Ducking, MD        PAST MEDICAL HISTORY: Past Medical History:  Diagnosis Date  . Abnormality of gait 03/01/2015  . Anxiety   . Arthritis    "knees" (01/19/2016)  . Atrial fibrillation with RVR ([place]) 01/19/2016  . Back pain   . Chest pain   . Childhood asthma   . Chronic pain    "nerve pain from the MS"  . Constipation   . Depression   . DVT (deep venous thrombosis) ([place]) 1983   "RLE; may have just been phlebitis; it was before the age of dopplers"  . 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 GERD (gastroesophageal reflux disease)   . History of stress test 06/21/10   limited exam with some degree of breast attenuatin of the apex however Toni component of apical ischemia is not excluded, EF 62%; cardiac cath   . HLD (hyperlipidemia)   . Hx of echocardiogram 04/22/10   EF 55-60%, no valve issues  . Hypertension   . Hypothyroidism   . Joint pain   . Migraine    "none since I stopped beta blocker in 1990s" (01/19/2016)  . Multiple sclerosis ([place])    has had this may 1989-[place] reg doc  . OSA on CPAP   . Osteoarthritis   . PAF (paroxysmal atrial fibrillation) ([place])    on coumadin; documented on monitor 06/2010  . Pneumonia 11/2011  . Shortness  of breath   . Thyroid disease     PAST SURGICAL HISTORY: Past Surgical History:  Procedure Laterality Date  . ANTERIOR CERVICAL DECOMP/DISCECTOMY FUSION  01/2004  . BACK SURGERY    . CARDIAC CATHETERIZATION  06/22/2010   normal coronary arteries, PAF  .  CATARACT EXTRACTION, BILATERAL Bilateral   . CHILD BIRTHS     X2  . DILATION AND CURETTAGE OF UTERUS    . INFUSION PUMP IMPLANTATION  ~ 2009   baclofen infusion in lower abd  . PAIN PUMP REVISION N/Toni 07/29/2014   Procedure: Baclofen pump replacement;  Surgeon: Erline Levine, MD;  Location: [facility];  Service: Neurosurgery;  Laterality: N/Toni;  Baclofen pump replacement  . TONSILLECTOMY AND ADENOIDECTOMY    . VARICOSE VEIN SURGERY  ~ 1968  . VENOUS ABLATION  12/16/2010   radiofreq ablation -Dr Elisabeth Cara and [facility]  . WISDOM TOOTH EXTRACTION      SOCIAL HISTORY: Social History   Tobacco Use  . Smoking status: Former Smoker    Packs/day: 0.50    Years: 7.00    Pack years: 3.50    Types: Cigarettes    Last attempt to quit: 08/21/1976    Years since quitting: 41.8  . Smokeless tobacco: Never Used  Substance Use Topics  . Alcohol use: Yes    Alcohol/week: 1.0 standard drinks    Types: 1 Glasses of wine per week  . Drug use: No    FAMILY HISTORY: Family History  Problem Relation Age of Onset  . Coronary artery disease Father        at age 66  . Hyperlipidemia Father   . Thyroid disease Father   . Coronary artery disease Maternal Grandmother   . Depression Maternal Grandmother   . Cancer Paternal Grandmother   . Depression Mother   . Sudden death Mother   . Anxiety disorder Mother   . Alcoholism Son     ROS: Review of Systems  Constitutional: Positive for weight loss.  Psychiatric/Behavioral: Positive for depression.    PHYSICAL EXAM: Blood pressure 125/89, pulse 93, height 5\' 3"  (1.6 m), weight 203 lb (92.1 kg), SpO2 99 %. Body mass index is 35.96 kg/m. Physical Exam  Constitutional: She is oriented to person,  place, and time. She appears well-developed and well-nourished.  Cardiovascular: Normal rate.  Pulmonary/Chest: Effort normal.  Musculoskeletal: Normal range of motion.  Neurological: She is oriented to person, place, and time.  Skin: Skin is warm and dry.  Psychiatric: She has Toni normal mood and affect. Her behavior is normal.  Vitals reviewed.   RECENT LABS AND TESTS: BMET    Component Value Date/Time   NA 144 04/23/2018 1159   K 4.2 04/23/2018 1159   CL 95 (L) 04/23/2018 1159   CO2 30 (H) 04/23/2018 1159   GLUCOSE 45 (L) 04/23/2018 1159   GLUCOSE 92 07/08/2017 1214   BUN 26 04/23/2018 1159   CREATININE 0.69 04/23/2018 1159   CALCIUM 9.5 04/23/2018 1159   GFRNONAA 88 04/23/2018 1159   GFRAA 102 04/23/2018 1159   Lab Results  Component Value Date   HGBA1C 5.3 04/23/2018   HGBA1C 5.2 10/01/2017   HGBA1C 5.4 03/08/2017   HGBA1C  04/21/2010    5.5 (NOTE)                                                                       According to the ADA Clinical Practice Recommendations for 2011, when HbA1c is used as Toni screening test:   >=6.5%   Diagnostic of  Diabetes Mellitus           (if abnormal result  is confirmed)  5.7-6.4%   Increased risk of developing Diabetes Mellitus  References:Diagnosis and Classification of Diabetes Mellitus,Diabetes AOZH,0865,78(IONGE 1):S62-S69 and Standards of Medical Care in         Diabetes - 2011,Diabetes XBMW,4132,44  (Suppl 1):S11-S61.   Lab Results  Component Value Date   INSULIN 7.8 04/23/2018   INSULIN 20.4 11/27/2017   INSULIN 20.4 03/08/2017   CBC    Component Value Date/Time   WBC 7.0 10/01/2017   WBC 8.4 07/08/2017 1214   RBC 4.83 07/08/2017 1214   HGB 15.1 10/01/2017   HGB 14.1 03/08/2017 1152   HCT 47 (Toni) 10/01/2017   HCT 43.0 03/08/2017 1152   PLT 185 10/01/2017   PLT 219 12/18/2014 1157   MCV 93.2 07/08/2017 1214   MCV 93 03/08/2017 1152   MCH 31.5 07/08/2017 1214   MCHC 33.8 07/08/2017 1214   RDW 13.3 07/08/2017 1214     RDW 13.3 03/08/2017 1152   LYMPHSABS 1.6 07/08/2017 1214   LYMPHSABS 2.2 03/08/2017 1152   MONOABS 1.3 (H) 07/08/2017 1214   EOSABS 0.2 07/08/2017 1214   EOSABS 0.1 03/08/2017 1152   BASOSABS 0.0 07/08/2017 1214   BASOSABS 0.0 03/08/2017 1152   Iron/TIBC/Ferritin/ %Sat No results found for: IRON, TIBC, FERRITIN, IRONPCTSAT Lipid Panel     Component Value Date/Time   CHOL 129 11/27/2017 1011   TRIG 60 11/27/2017 1011   HDL 51 11/27/2017 1011   CHOLHDL 3.0 04/22/2010 0536   VLDL 29 04/22/2010 0536   LDLCALC 66 11/27/2017 1011   Hepatic Function Panel     Component Value Date/Time   PROT 6.0 04/23/2018 1159   ALBUMIN 4.0 04/23/2018 1159   AST 22 04/23/2018 1159   ALT 31 04/23/2018 1159   ALKPHOS 62 04/23/2018 1159   BILITOT 0.3 04/23/2018 1159      Component Value Date/Time   TSH 0.651 11/27/2017 1011   TSH 2.410 03/08/2017 1152   TSH 1.083 08/06/2016 0647   TSH 1.871 01/19/2016 2302   TSH 0.206 (L) 03/10/2011 1351   Results for Glaude, Toni [name] "Toni [name]" (MRN [id number]) as of 06/06/2018 08:33  Ref. Range 04/23/2018 11:59  Vitamin D, 25-Hydroxy Latest Ref Range: 30.0 - 100.0 ng/mL 65.8   ASSESSMENT AND PLAN: Other depression - with emotional eating  Class 2 severe obesity with serious comorbidity and body mass index (BMI) of 35.0 to 35.9 in adult, unspecified obesity type ([place])  PLAN:  Depression with Emotional Eating Behaviors We discussed behavior modification techniques today to help Toni [name] deal with her emotional eating and depression. She has agreed to continue to take Wellbutrin SR 150mg  and follow up with Dr. Mallie Mussel for therapy. She agreed to follow up as directed in 3 weeks.  I spent > than 50% of the 15 minute visit on counseling as documented in the note.  Obesity Toni [name] is currently in the action stage of change. As such, her goal is to continue with weight loss efforts. She has agreed to follow the Category 2 plan. Toni [name] has been  instructed to work up to Toni goal of 150 minutes of combined cardio and strengthening exercise per week for weight loss and overall health benefits. We discussed the following Behavioral Modification Strategies today: decrease eating out, dealing with family, coworker sabotage and emotional eating strategies, travel eating strategies.  Toni [name] has agreed to follow up with our clinic in 3 weeks. She was  informed of the importance of frequent follow up visits to maximize her success with intensive lifestyle modifications for her multiple health conditions.   OBESITY BEHAVIORAL INTERVENTION VISIT  Today's visit was # 23  Starting weight: 257 lbs Starting date: 03/08/17 Today's weight : Weight: 203 lb (92.1 kg)  Today's date: 06/04/2018 Total lbs lost to date: 77  ASK: We discussed the diagnosis of obesity with Toni [name] today and Toni [name] agreed to give [name] permission to discuss obesity behavioral modification therapy today.  ASSESS: Toni [name] has the diagnosis of obesity and her BMI today is 35.97. Toni [name] is in the action stage of change.   ADVISE: Toni [name] was educated on the multiple health risks of obesity as well as the benefit of weight loss to improve her health. She was advised of the need for long term treatment and the importance of lifestyle modifications to improve her current health and to decrease her risk of future health problems.  AGREE: Multiple dietary modification options and treatment options were discussed and Toni [name] agreed to follow the recommendations documented in the above note.  ARRANGE: Toni [name] was educated on the importance of frequent visits to treat obesity as outlined per CMS and USPSTF guidelines and agreed to schedule her next follow up appointment today.  I, Marcille Blanco, am acting as transcriptionist for Starlyn Skeans, MD  I have reviewed the above documentation for accuracy and completeness, and I agree with the above. -Dennard Nip, MD

## 2018-06-10 ENCOUNTER — Telehealth (INDEPENDENT_AMBULATORY_CARE_PROVIDER_SITE_OTHER): Payer: Self-pay | Admitting: Family Medicine

## 2018-06-10 NOTE — Telephone Encounter (Signed)
Returned call to patient.

## 2018-06-10 NOTE — Telephone Encounter (Signed)
Toni [name],  [name] would like for you to give her a call.  She did not want to tell me what it was regarding. [name]

## 2018-06-12 ENCOUNTER — Ambulatory Visit (INDEPENDENT_AMBULATORY_CARE_PROVIDER_SITE_OTHER): Payer: Medicare Other | Admitting: Pharmacist Clinician (PhC)/ Clinical Pharmacy Specialist

## 2018-06-12 DIAGNOSIS — Z7901 Long term (current) use of anticoagulants: Secondary | ICD-10-CM | POA: Diagnosis not present

## 2018-06-12 DIAGNOSIS — I4821 Permanent atrial fibrillation: Secondary | ICD-10-CM

## 2018-06-12 LAB — POCT INR: INR: 2.2 (ref 2.0–3.0)

## 2018-06-13 DIAGNOSIS — M6281 Muscle weakness (generalized): Secondary | ICD-10-CM | POA: Diagnosis not present

## 2018-06-13 DIAGNOSIS — R262 Difficulty in walking, not elsewhere classified: Secondary | ICD-10-CM | POA: Diagnosis not present

## 2018-06-13 DIAGNOSIS — Z9181 History of falling: Secondary | ICD-10-CM | POA: Diagnosis not present

## 2018-06-13 DIAGNOSIS — M25669 Stiffness of unspecified knee, not elsewhere classified: Secondary | ICD-10-CM | POA: Diagnosis not present

## 2018-06-18 DIAGNOSIS — M6281 Muscle weakness (generalized): Secondary | ICD-10-CM | POA: Diagnosis not present

## 2018-06-18 DIAGNOSIS — Z9181 History of falling: Secondary | ICD-10-CM | POA: Diagnosis not present

## 2018-06-18 DIAGNOSIS — M25669 Stiffness of unspecified knee, not elsewhere classified: Secondary | ICD-10-CM | POA: Diagnosis not present

## 2018-06-18 DIAGNOSIS — R262 Difficulty in walking, not elsewhere classified: Secondary | ICD-10-CM | POA: Diagnosis not present

## 2018-06-18 DIAGNOSIS — Z23 Encounter for immunization: Secondary | ICD-10-CM | POA: Diagnosis not present

## 2018-06-18 NOTE — Progress Notes (Signed)
Office: [phone]  /  Fax: [phone]   Date: June 25, 2018 Time Seen: 12:00pm Duration: 32 minutes Provider: Glennie Isle, Psy.D. Type of Session: Individual Therapy   HPI: Elizabethwas referred by Dr. Lillia Carmel to depression with emotional eating behaviorsand was seen for an initial appointment with this provider on April 23, 2018. Per the note for theofficevisit withDr. Desmond Dike April 01, 2018,"Elizabethis struggling with night time eating, especially between 9 and 11 pm. She knows it is emotional eating but can't seem to change her behavior. Toni [name] struggles withemotional eating and using food for comfort to the extent that it is negatively impacting herhealth. Sheoften snacks when sheis not hungry. Elizabethsometimes feels sheis out of control and then feels guilty that shemade poor food choices. Toni [name] been working on behavior modification techniques to help reduce heremotional eating and has been somewhat successful.Sheshows no sign of suicidal or homicidal ideations."Moreover, perthe note for the initial visit withDr. Desmond Dike March 08, 2017, "[name] struggled with obesity for years and has been unsuccessful in either losing weight or maintaining long term weight loss." Sheshared shehas been heavy most of herlife andstarted gaining weight in her 50s. Herheaviest weight ever was 301pounds. During that initial appointment, she also endorsed the following:significant food cravings issues; snackingfrequently in the evenings; skippingmeals frequently;frequently drinking liquids with calories;problems with excessive hunger;frequentlyeatinglarger portions than normal;binge eating behaviors; andemotional eating.During the initial appointment with this provider,[name] shared, "I have real problems in the evening and I don't know why. I don't know if it is physiological or psychological." She described she can go long  periods during the day and not eat, but experiences difficulty in the evening. The aforementioned has been going on Toni [name]'s "whole life." She noted she cannot recall a time where she was "not heavy." Toni [name] denied a history of binge eating, purging behaviors, and other compensatory behaviors. She denied a history of a diagnosis of an eating disorder.Elizabethwas asked to complete a questionnaire assessing various behaviors related to emotional eating. Elizabethendorsed the following: experience food cravings on a regular basis, use food to help you cope with emotional situations, overeat when you are worried about something, overeat frequently when you are bored or lonely and overeat when you are alone, but eat much less when you are with other people.During today's appointment, Toni [name] shared she continues to eat at night, but denied any other episodes of emotional eating.   Session Content: Session focused on the following treatment goal: decrease emotional eating. The session was initiated with the administration of the PHQ-9 and GAD-7, as well as a brief check-in. Toni [name] reported engaging in pleasurable activities and achieving the goal established during the last appointment. She reported that she noticed a reduction in food consumed in the evenings as a result. In addition, Toni [name] discussed experiencing frustration related to her current weight loss. As such, today's appointment focused on thought defusion. Psychoeducation regarding thought defusion was provided and Toni [name] was led through an exercise. She described feeling in "control" toward the end of the exercise. A handout with various exercises was provided for Toni [name] to engage in between now and the next appointment. This provider also encouraged her to continue engaging in pleasurable activities to assist with emotional eating in the evenings. Overall, Toni [name] was receptive to today's appointment as evidenced by openness to  sharing, responsiveness to feedback, and engagement in a thought defusion exercise.   Mental Status Examination: Toni [name] arrived on time for the appointment. She presented as appropriately dressed and groomed. Toni [name] appeared her stated  age and demonstrated adequate orientation to time, place, person, and purpose of the appointment. She also demonstrated appropriate eye contact. No psychomotor abnormalities or behavioral peculiarities noted; however, she was observed ambulating with a wheelchair. Her mood was euthymic with congruent affect. Her thought processes were logical, linear, and goal-directed. No hallucinations, delusions, bizarre thinking or behavior reported or observed. Judgment, insight, and impulse control appeared to be grossly intact. There was no evidence of paraphasias (i.e., errors in speech, gross mispronunciations, and word substitutions), repetition deficits, or disturbances in volume or prosody (i.e., rhythm and intonation). There was no evidence of attention or memory impairments. Toni [name] denied current suicidal and homicidal ideation, intent or plan.  Structured Assessment Results: The Patient Health Questionnaire-9 (PHQ-9) is a self-report measure that assesses symptoms and severity of depression over the course of the last two weeks. Toni [name] obtained a score of seven suggesting mild depression. Toni [name] finds the endorsed symptoms to be somewhat difficult. Depression screen PHQ 2/9 06/25/2018  Decreased Interest 0  Down, Depressed, Hopeless 0  PHQ - 2 Score 0  Altered sleeping 3  Tired, decreased energy 2  Change in appetite 0  Feeling bad or failure about yourself  1  Trouble concentrating 0  Moving slowly or fidgety/restless 1  Suicidal thoughts 0  PHQ-9 Score 7  Some recent data might be hidden   The Generalized Anxiety Disorder-7 (GAD-7) is a brief self-report measure that assesses symptoms of anxiety over the course of the last two weeks. [name] obtained  a score of three suggesting minimal anxiety. GAD 7 : Generalized Anxiety Score 06/25/2018  Nervous, Anxious, on Edge 1  Control/stop worrying 0  Worry too much - different things 0  Trouble relaxing 1  Restless 0  Easily annoyed or irritable 1  Afraid - awful might happen 0  Total GAD 7 Score 3  Anxiety Difficulty Somewhat difficult   Interventions: Khianna was administered the PHQ-9 and GAD-7 for symptom monitoring. Content from the last session was reviewed. Throughout today's session, empathic reflections and validation were provided. Psychoeducation regarding thought defusion was provided.   DSM-5 Diagnosis: 311 (F32.8) Other Specified Depressive Disorder, Emotional Eating  Treatment Goal & Progress: Gerline was seen for an initial appointment with this provider on April 23, 2018 during which the following treatment goal was established: decrease emotional eating. Violetta has demonstrated progress in her goal of decreasing emotional eating as evidenced by increased awareness of hunger pattern and triggers for emotional eating. Per self-report, Lilyannah is engaging in learned skills.   Plan: Amyre continues to appear able and willing to participate as evidenced by engagement in reciprocal conversation, and asking questions for clarification as appropriate. Aleah expressed desire to coordinate appointment at this clinic; therefore, the next appointment will be scheduled in two to three weeks. The next session will focus further on thought defusion, and working towards the established treatment goal.

## 2018-06-20 DIAGNOSIS — R262 Difficulty in walking, not elsewhere classified: Secondary | ICD-10-CM | POA: Diagnosis not present

## 2018-06-20 DIAGNOSIS — M6281 Muscle weakness (generalized): Secondary | ICD-10-CM | POA: Diagnosis not present

## 2018-06-20 DIAGNOSIS — Z9181 History of falling: Secondary | ICD-10-CM | POA: Diagnosis not present

## 2018-06-20 DIAGNOSIS — M25669 Stiffness of unspecified knee, not elsewhere classified: Secondary | ICD-10-CM | POA: Diagnosis not present

## 2018-06-24 DIAGNOSIS — Z9181 History of falling: Secondary | ICD-10-CM | POA: Diagnosis not present

## 2018-06-24 DIAGNOSIS — M25669 Stiffness of unspecified knee, not elsewhere classified: Secondary | ICD-10-CM | POA: Diagnosis not present

## 2018-06-24 DIAGNOSIS — R262 Difficulty in walking, not elsewhere classified: Secondary | ICD-10-CM | POA: Diagnosis not present

## 2018-06-24 DIAGNOSIS — M6281 Muscle weakness (generalized): Secondary | ICD-10-CM | POA: Diagnosis not present

## 2018-06-25 ENCOUNTER — Ambulatory Visit (INDEPENDENT_AMBULATORY_CARE_PROVIDER_SITE_OTHER): Payer: Medicare Other | Admitting: Psychology

## 2018-06-25 ENCOUNTER — Ambulatory Visit (INDEPENDENT_AMBULATORY_CARE_PROVIDER_SITE_OTHER): Payer: Medicare Other | Admitting: Family Medicine

## 2018-06-25 VITALS — BP 115/76 | HR 74 | Ht 63.0 in | Wt 201.0 lb

## 2018-06-25 DIAGNOSIS — Z6835 Body mass index (BMI) 35.0-35.9, adult: Secondary | ICD-10-CM | POA: Diagnosis not present

## 2018-06-25 DIAGNOSIS — I10 Essential (primary) hypertension: Secondary | ICD-10-CM | POA: Diagnosis not present

## 2018-06-25 DIAGNOSIS — F3289 Other specified depressive episodes: Secondary | ICD-10-CM

## 2018-06-25 NOTE — Progress Notes (Signed)
Office: [phone]  /  Fax: [phone]   HPI:   Chief Complaint: OBESITY Toni [name] is here to discuss her progress with her obesity treatment plan. She is on the Category 2 plan and is following her eating plan approximately 95-98 % of the time. She states she is doing physical therapy for 20-60 minutes 6 times per week. Toni [name] continues to well with weight loss. She was able to ambulate with walker approximately 50 feet recently at physical therapy, which is her best in years. She is following her Category 2 closely and her hunger is controlled.  Her weight is 201 lb (91.2 kg) today and has had a weight loss of 2 pounds over a period of 3 weeks since her last visit. She has lost 56 lbs since starting treatment with [name].  Hypertension Toni [name] is a 72 y.o. female with hypertension. Toni [name]'s blood pressure is stable on Cardizem and Lasix. She denies chest pain, headaches, or dizziness. She is doing very well with weight loss and diet to help control her blood pressure with the goal of decreasing her risk of heart attack and stroke. Toni [name]'s blood pressure is currently controlled.  ALLERGIES: Allergies  Allergen Reactions  . Hydrocodone Nausea And Vomiting    Hydrocodone causes vomiting  . Oxycodone Nausea And Vomiting    Oral oxycodone causes vomiting  . Chlorhexidine Gluconate Rash    Sores at site  . Zoloft [Sertraline] Hives, Swelling and Rash    MEDICATIONS: Current Outpatient Medications on File Prior to Visit  Medication Sig Dispense Refill  . acetaminophen (TYLENOL) 500 MG tablet Take 500-1,000 mg by mouth every 6 (six) hours as needed for fever or headache (or pain).    Marland Kitchen ALPRAZolam (XANAX) 0.5 MG tablet Take 1 tablet (0.5 mg total) by mouth 2 (two) times daily as needed for anxiety. 60 tablet 0  . baclofen (LIORESAL) 20 MG tablet Take 1 tablet (20 mg total) by mouth 3 (three) times daily as needed for muscle spasms. (Patient taking differently: Take 20 mg  by mouth See admin instructions. Once to twice a day for breakthrough spasms) 180 each 3  . Biotin (PA BIOTIN) 1000 MCG tablet Take 1,000 mcg by mouth daily.    Marland Kitchen buPROPion (WELLBUTRIN XL) 300 MG 24 hr tablet Take 1 tablet (300 mg total) by mouth daily. 30 tablet 0  . CALCIUM CITRATE PO Take 2 tablets by mouth 2 (two) times daily.    . cetirizine (ZYRTEC) 10 MG tablet Take 10 mg by mouth daily.    . Cholecalciferol (VITAMIN D) 2000 units tablet Take 1 tablet (2,000 Units total) by mouth 2 (two) times daily.  0  . co-enzyme Q-10 30 MG capsule Take 30 mg by mouth daily.    . Cranberry 1000 MG CAPS Take by mouth.    . diltiazem (CARDIZEM CD) 180 MG 24 hr capsule TAKE 1 CAPSULE(180 MG) BY MOUTH DAILY 90 capsule 3  . docusate sodium (COLACE) 100 MG capsule Take 1 capsule (100 mg total) by mouth daily. 10 capsule 0  . fluticasone (FLONASE) 50 MCG/ACT nasal spray Place 2 sprays into both nostrils daily as needed for allergies.     . furosemide (LASIX) 40 MG tablet Take 80 mg by mouth daily.     Marland Kitchen gabapentin (NEURONTIN) 300 MG capsule TAKE 1 CAPSULE BY MOUTH UP  TO 4 TIMES DAILY 360 capsule 3  . GENERLAC 10 GM/15ML SOLN TAKE 15 MLS BY MOUTH TWICE DAILY AS NEEDED 946 mL 0  .  HP ACTHAR 80 UNIT/ML injectable gel INJECT 80 UNITS (1ML) SUBCUTANEOUSLY EVERY OTHER DAY FOR A TOTAL OF 10 DAYS. REPEAT EVERY 30 DAYS. (DISCARD 28 DAYS AFTER 1ST USE) 5 mL 5  . lactulose, encephalopathy, (GENERLAC) 10 GM/15ML SOLN TAKE 15 MLS BY MOUTH TWICE DAILY AS NEEDED 946 mL 3  . levothyroxine (SYNTHROID, LEVOTHROID) 75 MCG tablet Take 75 mcg by mouth daily.    . Multiple Vitamins-Minerals (CENTRUM SILVER PO) Take 1 tablet by mouth daily.    . nebivolol (BYSTOLIC) 10 MG tablet Take 20 mg by mouth daily.     . Omega-3 Fatty Acids (FISH OIL) 1000 MG CAPS Take 1,000 mg by mouth daily.    Marland Kitchen omeprazole (PRILOSEC) 20 MG capsule Take 20 mg by mouth daily.     . psyllium (METAMUCIL SMOOTH TEXTURE) 28 % packet Take 1 packet by mouth  every other day.    . ranitidine (ZANTAC) 150 MG tablet Take 150 mg by mouth at bedtime.     . simvastatin (ZOCOR) 10 MG tablet     . SYRINGE/NEEDLE, DISP, 1 ML (BD LUER-LOK SYRINGE) 25G X 5/8" 1 ML MISC Use as directed to inject  Acthar 10 each prn  . Tamsulosin HCl (FLOMAX) 0.4 MG CAPS Take 0.4 mg by mouth daily.    Marland Kitchen UNABLE TO FIND Baclofen pump: Continuous    . warfarin (COUMADIN) 5 MG tablet Take 1 tablet by mouth daily as directed by coumadin clinic. 90 tablet 1   Current Facility-Administered Medications on File Prior to Visit  Medication Dose Route Frequency Provider Last Rate Last Dose  . baclofen (LIORESAL) intrathecal injection 40 mg/58mL  40 mg Intrathecal Once Kathrynn Ducking, MD        PAST MEDICAL HISTORY: Past Medical History:  Diagnosis Date  . Abnormality of gait 03/01/2015  . Anxiety   . Arthritis    "knees" (01/19/2016)  . Atrial fibrillation with RVR ([place]) 01/19/2016  . Back pain   . Chest pain   . Childhood asthma   . Chronic pain    "nerve pain from the MS"  . Constipation   . Depression   . DVT (deep venous thrombosis) ([place]) 1983   "RLE; may have just been phlebitis; it was before the age of dopplers"  . 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 GERD (gastroesophageal reflux disease)   . History of stress test 06/21/10   limited exam with some degree of breast attenuatin of the apex however a component of apical ischemia is not excluded, EF 62%; cardiac cath   . HLD (hyperlipidemia)   . Hx of echocardiogram 04/22/10   EF 55-60%, no valve issues  . Hypertension   . Hypothyroidism   . Joint pain   . Migraine    "none since I stopped beta blocker in 1990s" (01/19/2016)  . Multiple sclerosis ([place])    has had this may 1989-[place] reg doc  . OSA on CPAP   . Osteoarthritis   . PAF (paroxysmal atrial fibrillation) ([place])    on coumadin; documented on monitor 06/2010  . Pneumonia 11/2011  . Shortness of  breath   . Thyroid disease     PAST SURGICAL HISTORY: Past Surgical History:  Procedure Laterality Date  . ANTERIOR CERVICAL DECOMP/DISCECTOMY FUSION  01/2004  . BACK SURGERY    . CARDIAC CATHETERIZATION  06/22/2010   normal coronary arteries, PAF  . CATARACT EXTRACTION, BILATERAL  Bilateral   . CHILD BIRTHS     X2  . DILATION AND CURETTAGE OF UTERUS    . INFUSION PUMP IMPLANTATION  ~ 2009   baclofen infusion in lower abd  . PAIN PUMP REVISION N/A 07/29/2014   Procedure: Baclofen pump replacement;  Surgeon: Erline Levine, MD;  Location: [place] [facility];  Service: Neurosurgery;  Laterality: N/A;  Baclofen pump replacement  . TONSILLECTOMY AND ADENOIDECTOMY    . VARICOSE VEIN SURGERY  ~ 1968  . VENOUS ABLATION  12/16/2010   radiofreq ablation -Dr Elisabeth Cara and [facility]  . WISDOM TOOTH EXTRACTION      SOCIAL HISTORY: Social History   Tobacco Use  . Smoking status: Former Smoker    Packs/day: 0.50    Years: 7.00    Pack years: 3.50    Types: Cigarettes    Last attempt to quit: 08/21/1976    Years since quitting: 41.8  . Smokeless tobacco: Never Used  Substance Use Topics  . Alcohol use: Yes    Alcohol/week: 1.0 standard drinks    Types: 1 Glasses of wine per week  . Drug use: No    FAMILY HISTORY: Family History  Problem Relation Age of Onset  . Coronary artery disease Father        at age 69  . Hyperlipidemia Father   . Thyroid disease Father   . Coronary artery disease Maternal Grandmother   . Depression Maternal Grandmother   . Cancer Paternal Grandmother   . Depression Mother   . Sudden death Mother   . Anxiety disorder Mother   . Alcoholism Son     ROS: Review of Systems  Constitutional: Positive for weight loss.  Cardiovascular: Negative for chest pain.  Neurological: Negative for dizziness and headaches.    PHYSICAL EXAM: Blood pressure 115/76, pulse 74, height 5\' 3"  (1.6 m), weight 201 lb (91.2 kg), SpO2 94 %. Body mass index is 35.61 kg/m. Physical Exam    Constitutional: She is oriented to person, place, and time. She appears well-developed and well-nourished.  Cardiovascular: Normal rate.  Pulmonary/Chest: Effort normal.  Musculoskeletal: Normal range of motion.  Neurological: She is oriented to person, place, and time.  Skin: Skin is warm and dry.  Psychiatric: She has a normal mood and affect. Her behavior is normal.  Vitals reviewed.   RECENT LABS AND TESTS: BMET    Component Value Date/Time   NA 144 04/23/2018 1159   K 4.2 04/23/2018 1159   CL 95 (L) 04/23/2018 1159   CO2 30 (H) 04/23/2018 1159   GLUCOSE 45 (L) 04/23/2018 1159   GLUCOSE 92 07/08/2017 1214   BUN 26 04/23/2018 1159   CREATININE 0.69 04/23/2018 1159   CALCIUM 9.5 04/23/2018 1159   GFRNONAA 88 04/23/2018 1159   GFRAA 102 04/23/2018 1159   Lab Results  Component Value Date   HGBA1C 5.3 04/23/2018   HGBA1C 5.2 10/01/2017   HGBA1C 5.4 03/08/2017   HGBA1C  04/21/2010    5.5 (NOTE)                                                                       According to the ADA Clinical Practice Recommendations for 2011, when HbA1c is used as a screening test:   >=  6.5%   Diagnostic of Diabetes Mellitus           (if abnormal result  is confirmed)  5.7-6.4%   Increased risk of developing Diabetes Mellitus  References:Diagnosis and Classification of Diabetes Mellitus,Diabetes WUJW,1191,47(WGNFA 1):S62-S69 and Standards of Medical Care in         Diabetes - 2011,Diabetes Care,2011,34  (Suppl 1):S11-S61.   Lab Results  Component Value Date   INSULIN 7.8 04/23/2018   INSULIN 20.4 11/27/2017   INSULIN 20.4 03/08/2017   CBC    Component Value Date/Time   WBC 7.0 10/01/2017   WBC 8.4 07/08/2017 1214   RBC 4.83 07/08/2017 1214   HGB 15.1 10/01/2017   HGB 14.1 03/08/2017 1152   HCT 47 (A) 10/01/2017   HCT 43.0 03/08/2017 1152   PLT 185 10/01/2017   PLT 219 12/18/2014 1157   MCV 93.2 07/08/2017 1214   MCV 93 03/08/2017 1152   MCH 31.5 07/08/2017 1214   MCHC 33.8  07/08/2017 1214   RDW 13.3 07/08/2017 1214   RDW 13.3 03/08/2017 1152   LYMPHSABS 1.6 07/08/2017 1214   LYMPHSABS 2.2 03/08/2017 1152   MONOABS 1.3 (H) 07/08/2017 1214   EOSABS 0.2 07/08/2017 1214   EOSABS 0.1 03/08/2017 1152   BASOSABS 0.0 07/08/2017 1214   BASOSABS 0.0 03/08/2017 1152   Iron/TIBC/Ferritin/ %Sat No results found for: IRON, TIBC, FERRITIN, IRONPCTSAT Lipid Panel     Component Value Date/Time   CHOL 129 11/27/2017 1011   TRIG 60 11/27/2017 1011   HDL 51 11/27/2017 1011   CHOLHDL 3.0 04/22/2010 0536   VLDL 29 04/22/2010 0536   LDLCALC 66 11/27/2017 1011   Hepatic Function Panel     Component Value Date/Time   PROT 6.0 04/23/2018 1159   ALBUMIN 4.0 04/23/2018 1159   AST 22 04/23/2018 1159   ALT 31 04/23/2018 1159   ALKPHOS 62 04/23/2018 1159   BILITOT 0.3 04/23/2018 1159      Component Value Date/Time   TSH 0.651 11/27/2017 1011   TSH 2.410 03/08/2017 1152   TSH 1.083 08/06/2016 0647   TSH 1.871 01/19/2016 2302   TSH 0.206 (L) 03/10/2011 1351    ASSESSMENT AND PLAN: Essential hypertension  Class 2 severe obesity with serious comorbidity and body mass index (BMI) of 35.0 to 35.9 in adult, unspecified obesity type ([place])  PLAN:  Hypertension We discussed sodium restriction, working on healthy weight loss, and a regular exercise program as the means to achieve improved blood pressure control. Toni [name] agreed with this plan and agreed to follow up as directed. We will continue to monitor her blood pressure as well as her progress with the above lifestyle modifications. Toni [name] agrees to continue her medications, diet, exercise, and will decrease sodium intake. She will watch for signs of hypotension as she continues her lifestyle modifications. Toni [name] agrees to follow up with our clinic in 2 to 3 weeks.  I spent > than 50% of the 15 minute visit on counseling as documented in the note.  Obesity Toni [name] is currently in the action stage of  change. As such, her goal is to continue with weight loss efforts She has agreed to follow the Category 2 plan Toni [name] has been instructed to work up to a goal of 150 minutes of combined cardio and strengthening exercise per week for weight loss and overall health benefits. We discussed the following Behavioral Modification Strategies today: increasing lean protein intake, decreasing simple carbohydrates, decreasing sodium intake, and work on meal planning and easy  cooking plans   Toni [name] has agreed to follow up with our clinic in 2 to 3 weeks. She was informed of the importance of frequent follow up visits to maximize her success with intensive lifestyle modifications for her multiple health conditions.   OBESITY BEHAVIORAL INTERVENTION VISIT  Today's visit was # 25  Starting weight: 257 lbs Starting date: 03/08/17 Today's weight : 201 lbs  Today's date: 06/25/2018 Total lbs lost to date: 28    ASK: We discussed the diagnosis of obesity with Toni [name] today and Toni [name] agreed to give [name] permission to discuss obesity behavioral modification therapy today.  ASSESS: Toni [name] has the diagnosis of obesity and her BMI today is 35.61 Toni [name] is in the action stage of change   ADVISE: Toni [name] was educated on the multiple health risks of obesity as well as the benefit of weight loss to improve her health. She was advised of the need for long term treatment and the importance of lifestyle modifications to improve her current health and to decrease her risk of future health problems.  AGREE: Multiple dietary modification options and treatment options were discussed and  Toni [name] agreed to follow the recommendations documented in the above note.  ARRANGE: Toni [name] was educated on the importance of frequent visits to treat obesity as outlined per CMS and USPSTF guidelines and agreed to schedule her next follow up appointment today.  I, Trixie Dredge, am acting as  transcriptionist for Dennard Nip, MD  I have reviewed the above documentation for accuracy and completeness, and I agree with the above. -Dennard Nip, MD

## 2018-06-26 ENCOUNTER — Encounter (INDEPENDENT_AMBULATORY_CARE_PROVIDER_SITE_OTHER): Payer: Self-pay | Admitting: Family Medicine

## 2018-06-27 DIAGNOSIS — M6281 Muscle weakness (generalized): Secondary | ICD-10-CM | POA: Diagnosis not present

## 2018-06-27 DIAGNOSIS — R262 Difficulty in walking, not elsewhere classified: Secondary | ICD-10-CM | POA: Diagnosis not present

## 2018-06-27 DIAGNOSIS — M25669 Stiffness of unspecified knee, not elsewhere classified: Secondary | ICD-10-CM | POA: Diagnosis not present

## 2018-06-27 DIAGNOSIS — Z9181 History of falling: Secondary | ICD-10-CM | POA: Diagnosis not present

## 2018-07-01 DIAGNOSIS — M25669 Stiffness of unspecified knee, not elsewhere classified: Secondary | ICD-10-CM | POA: Diagnosis not present

## 2018-07-01 DIAGNOSIS — R262 Difficulty in walking, not elsewhere classified: Secondary | ICD-10-CM | POA: Diagnosis not present

## 2018-07-01 DIAGNOSIS — Z9181 History of falling: Secondary | ICD-10-CM | POA: Diagnosis not present

## 2018-07-01 DIAGNOSIS — M6281 Muscle weakness (generalized): Secondary | ICD-10-CM | POA: Diagnosis not present

## 2018-07-08 ENCOUNTER — Ambulatory Visit (INDEPENDENT_AMBULATORY_CARE_PROVIDER_SITE_OTHER): Payer: Medicare Other | Admitting: Pharmacist

## 2018-07-08 DIAGNOSIS — Z7901 Long term (current) use of anticoagulants: Secondary | ICD-10-CM | POA: Diagnosis not present

## 2018-07-08 DIAGNOSIS — I4821 Permanent atrial fibrillation: Secondary | ICD-10-CM | POA: Diagnosis not present

## 2018-07-08 DIAGNOSIS — Z9181 History of falling: Secondary | ICD-10-CM | POA: Diagnosis not present

## 2018-07-08 DIAGNOSIS — M6281 Muscle weakness (generalized): Secondary | ICD-10-CM | POA: Diagnosis not present

## 2018-07-08 DIAGNOSIS — M25669 Stiffness of unspecified knee, not elsewhere classified: Secondary | ICD-10-CM | POA: Diagnosis not present

## 2018-07-08 DIAGNOSIS — R262 Difficulty in walking, not elsewhere classified: Secondary | ICD-10-CM | POA: Diagnosis not present

## 2018-07-08 LAB — POCT INR: INR: 2.7 (ref 2.0–3.0)

## 2018-07-09 ENCOUNTER — Ambulatory Visit (INDEPENDENT_AMBULATORY_CARE_PROVIDER_SITE_OTHER): Payer: Medicare Other | Admitting: Family Medicine

## 2018-07-09 ENCOUNTER — Ambulatory Visit (INDEPENDENT_AMBULATORY_CARE_PROVIDER_SITE_OTHER): Payer: Medicare Other | Admitting: Psychology

## 2018-07-09 VITALS — BP 101/69 | HR 87 | Temp 98.0°F | Ht 63.0 in | Wt 199.0 lb

## 2018-07-09 DIAGNOSIS — Z6835 Body mass index (BMI) 35.0-35.9, adult: Secondary | ICD-10-CM | POA: Diagnosis not present

## 2018-07-09 DIAGNOSIS — F3289 Other specified depressive episodes: Secondary | ICD-10-CM | POA: Diagnosis not present

## 2018-07-09 DIAGNOSIS — E860 Dehydration: Secondary | ICD-10-CM

## 2018-07-09 NOTE — Progress Notes (Signed)
Office: [phone]  /  Fax: [phone]   Date: July 09, 2018   Time Seen: 11:52am Duration: 30 minutes Provider: Glennie Isle, Psy.D. Type of Session: Individual Therapy   HPI: Elizabethwas referred by Dr. Lillia Carmel to depression with emotional eating behaviorsand was seen for an initial appointment with this provider on April 23, 2018. Per the note for theofficevisit withDr. Desmond Dike April 01, 2018,"Elizabethis struggling with night time eating, especially between 9 and 11 pm. She knows it is emotional eating but can't seem to change her behavior. [name] struggles withemotional eating and using food for comfort to the extent that it is negatively impacting herhealth. Sheoften snacks when sheis not hungry. Elizabethsometimes feels sheis out of control and then feels guilty that shemade poor food choices. [name] been working on behavior modification techniques to help reduce heremotional eating and has been somewhat successful.Sheshows no sign of suicidal or homicidal ideations."Moreover, perthe note for the initial visit withDr. Desmond Dike March 08, 2017, "[name] struggled with obesity for years and has been unsuccessful in either losing weight or maintaining long term weight loss." Sheshared shehas been heavy most of herlife andstarted gaining weight in her 53s. Herheaviest weight ever was 301pounds. During that initial appointment, she also endorsed the following:significant food cravings issues; snackingfrequently in the evenings; skippingmeals frequently;frequently drinking liquids with calories;problems with excessive hunger;frequentlyeatinglarger portions than normal;binge eating behaviors; andemotional eating.During the initial appointment with this provider,[name] shared, "I have real problems in the evening and I don't know why. I don't know if it is physiological or psychological." She described she can go  long periods during the day and not eat, but experiences difficulty in the evening. The aforementioned has been going on [name]'s "whole life." She noted she cannot recall a time where she was "not heavy." [name] denied a history of binge eating, purging behaviors, and other compensatory behaviors. She denied a history of a diagnosis of an eating disorder.Elizabethwas asked to complete a questionnaire assessing various behaviors related to emotional eating. Elizabethendorsed the following: experience food cravings on a regular basis, use food to help you cope with emotional situations, overeat when you are worried about something, overeat frequently when you are bored or lonely and overeat when you are alone, but eat much less when you are with other people.During today's appointment, [name] continues to eat at night, but denied any other episodes of emotional eating.  Session Content: Session focused on the following treatment goal: decrease emotional eating. The session was initiated with the administration of the PHQ-9 and GAD-7, as well as a brief check-in. [name] reported experiencing worry related to her husband's well-being, and noted, "I just want him to have an outlet." This was explored, and thoughts and feelings associated with the aforementioned were processed. [name] acknowledged she often checks-in with her husband, and engages in activities with him that he enjoys. Regarding eating, [name] reported things are going "okay." She denied experiencing emotional hunger, but noted she often saves snack calories or specific foods (e.g., fruit) from her meal plan to eat after supper. [name] indicated that since the onset of treatment with this provider, there has been a reduction in emotional eating. Session then focused on thought defusion. [name] reported she has been "working on it." She was provided a handout with an additional thought defusion exercise. Remainder  of the appointment focused on behavioral strategies for Thanksgiving. [name] was given a handout. Today's appointment concluded with a discussion about termination. At this time, [name] declined a referral for longer  term therapeutic services as she feels things are going well. Based on her self-report, and current scores on the PHQ-9 and GAD-7, the next appointment will be scheduled in three to four weeks. [name] was receptive to today's session as evidenced by her openness to sharing, responsiveness to feedback, and her willingness to implement discussed strategies.   Mental Status Examination: [name] arrived early for the appointment; therefore, the appointment was initiated early. She presented as appropriately dressed and groomed. [name] appeared her stated age and demonstrated adequate orientation to time, place, person, and purpose of the appointment. She also demonstrated appropriate eye contact. No psychomotor abnormalities or behavioral peculiarities noted; however, she was observed ambulating with a wheelchair. Her mood was euthymic with congruent affect. Her thought processes were logical, linear, and goal-directed. No hallucinations, delusions, bizarre thinking or behavior reported or observed. Judgment, insight, and impulse control appeared to be grossly intact. There was no evidence of paraphasias (i.e., errors in speech, gross mispronunciations, and word substitutions), repetition deficits, or disturbances in volume or prosody (i.e., rhythm and intonation). There was no evidence of attention or memory impairments. [name] denied current suicidal and homicidal ideation, intent or plan.  Structured Assessment Results: The Patient Health Questionnaire-9 (PHQ-9) is a self-report measure that assesses symptoms and severity of depression over the course of the last two weeks. [name] obtained a score of three suggesting minimal depression. [name] finds the endorsed symptoms to be  not difficult at all. Depression screen PHQ 2/9 07/09/2018  Decreased Interest 0  Down, Depressed, Hopeless 0  PHQ - 2 Score 0  Altered sleeping 1  Tired, decreased energy 1  Change in appetite 0  Feeling bad or failure about yourself  0  Trouble concentrating 1  Moving slowly or fidgety/restless 0  Suicidal thoughts 0  PHQ-9 Score 3  Some recent data might be hidden   The Generalized Anxiety Disorder-7 (GAD-7) is a brief self-report measure that assesses symptoms of anxiety over the course of the last two weeks. Jametta obtained a score of one suggesting minimal anxiety. GAD 7 : Generalized Anxiety Score 07/09/2018  Nervous, Anxious, on Edge 0  Control/stop worrying 0  Worry too much - different things 0  Trouble relaxing 0  Restless 0  Easily annoyed or irritable 1  Afraid - awful might happen 0  Total GAD 7 Score 1  Anxiety Difficulty Not difficult at all   Interventions: Karalyne was administered the PHQ-9 and GAD-7 for symptom monitoring. Content from the last session was reviewed. Throughout today's session, empathic reflections and validation were provided. This provider assisted Burnett Kanaris in processing feelings and thoughts related to her husband. A handout for an additional thought defusion exercise was provided. Psychoeducation regarding behavioral strategies for Thanksgiving was provided. Today's session also focused on termination planning.   DSM-5 Diagnosis: 311 (F32.8) Other Specified Depressive Disorder, Emotional Eating  Treatment Goal & Progress: Roda was seen for an initial appointment with this provider on April 23, 2018 during which the following treatment goal was established: decrease emotional eating. Janeane has demonstrated progress in her goal of decreasing emotional eating as evidenced by an increased awareness in hunger patterns and triggers for emotional eating. She also reported an overall reduction in emotional eating since the onset of  treatment with this provider.   Plan: Aritza continues to appear able and willing to participate as evidenced by engagement in reciprocal conversation, and asking questions for clarification as appropriate. The next appointment will be scheduled in one month. The next session will  focus on reviewing learned skills, and termination.

## 2018-07-10 NOTE — Progress Notes (Signed)
Office: [phone]  /  Fax: [phone]   HPI:   Chief Complaint: OBESITY Toni [name] is here to discuss her progress with her obesity treatment plan. She is on the Category 2 plan and is following her eating plan approximately 98 to 99 % of the time. She states she is doing physical therapy 20 to 60 minutes 4 to 5 times per week. Toni [name] continues to do well with the Category 2 plan. Toni [name] continues to lose weight at a slow steady pace and she is working on increasing lean protein and vegetables. She has questions about Thanksgiving eating strategies. Her weight is 199 lb (90.3 kg) today and has had a weight loss of 2 pounds over a period of 2 weeks since her last visit. She has lost 58 lbs since starting treatment with [name].  Dehydration Toni [name]'s blood pressure is low today at 101/69 and she notes feeling lightheaded with changing positions. Toni [name] is on Lasix 80 mg qHS, which has decreased from 160 mg daily, but with her continued weight loss, this dose may be too high.  ALLERGIES: Allergies  Allergen Reactions  . Hydrocodone Nausea And Vomiting    Hydrocodone causes vomiting  . Oxycodone Nausea And Vomiting    Oral oxycodone causes vomiting  . Chlorhexidine Gluconate Rash    Sores at site  . Zoloft [Sertraline] Hives, Swelling and Rash    MEDICATIONS: Current Outpatient Medications on File Prior to Visit  Medication Sig Dispense Refill  . acetaminophen (TYLENOL) 500 MG tablet Take 500-1,000 mg by mouth every 6 (six) hours as needed for fever or headache (or pain).    Marland Kitchen ALPRAZolam (XANAX) 0.5 MG tablet Take 1 tablet (0.5 mg total) by mouth 2 (two) times daily as needed for anxiety. 60 tablet 0  . baclofen (LIORESAL) 20 MG tablet Take 1 tablet (20 mg total) by mouth 3 (three) times daily as needed for muscle spasms. (Patient taking differently: Take 20 mg by mouth See admin instructions. Once to twice a day for breakthrough spasms) 180 each 3  . Biotin (PA BIOTIN) 1000  MCG tablet Take 1,000 mcg by mouth daily.    Marland Kitchen buPROPion (WELLBUTRIN XL) 300 MG 24 hr tablet Take 1 tablet (300 mg total) by mouth daily. 30 tablet 0  . CALCIUM CITRATE PO Take 2 tablets by mouth 2 (two) times daily.    . cetirizine (ZYRTEC) 10 MG tablet Take 10 mg by mouth daily.    . Cholecalciferol (VITAMIN D) 2000 units tablet Take 1 tablet (2,000 Units total) by mouth 2 (two) times daily.  0  . co-enzyme Q-10 30 MG capsule Take 30 mg by mouth daily.    . Cranberry 1000 MG CAPS Take by mouth.    . diltiazem (CARDIZEM CD) 180 MG 24 hr capsule TAKE 1 CAPSULE(180 MG) BY MOUTH DAILY 90 capsule 3  . docusate sodium (COLACE) 100 MG capsule Take 1 capsule (100 mg total) by mouth daily. 10 capsule 0  . fluticasone (FLONASE) 50 MCG/ACT nasal spray Place 2 sprays into both nostrils daily as needed for allergies.     . furosemide (LASIX) 40 MG tablet Take 80 mg by mouth daily.     Marland Kitchen gabapentin (NEURONTIN) 300 MG capsule TAKE 1 CAPSULE BY MOUTH UP  TO 4 TIMES DAILY 360 capsule 3  . GENERLAC 10 GM/15ML SOLN TAKE 15 MLS BY MOUTH TWICE DAILY AS NEEDED 946 mL 0  . HP ACTHAR 80 UNIT/ML injectable gel INJECT 80 UNITS (1ML) SUBCUTANEOUSLY EVERY OTHER DAY  FOR A TOTAL OF 10 DAYS. REPEAT EVERY 30 DAYS. (DISCARD 28 DAYS AFTER 1ST USE) 5 mL 5  . lactulose, encephalopathy, (GENERLAC) 10 GM/15ML SOLN TAKE 15 MLS BY MOUTH TWICE DAILY AS NEEDED 946 mL 3  . levothyroxine (SYNTHROID, LEVOTHROID) 75 MCG tablet Take 75 mcg by mouth daily.    . Multiple Vitamins-Minerals (CENTRUM SILVER PO) Take 1 tablet by mouth daily.    . nebivolol (BYSTOLIC) 10 MG tablet Take 20 mg by mouth daily.     . Omega-3 Fatty Acids (FISH OIL) 1000 MG CAPS Take 1,000 mg by mouth daily.    Marland Kitchen omeprazole (PRILOSEC) 20 MG capsule Take 20 mg by mouth daily.     . psyllium (METAMUCIL SMOOTH TEXTURE) 28 % packet Take 1 packet by mouth every other day.    . ranitidine (ZANTAC) 150 MG tablet Take 150 mg by mouth at bedtime.     . simvastatin (ZOCOR) 10  MG tablet     . SYRINGE/NEEDLE, DISP, 1 ML (BD LUER-LOK SYRINGE) 25G X 5/8" 1 ML MISC Use as directed to inject  Acthar 10 each prn  . Tamsulosin HCl (FLOMAX) 0.4 MG CAPS Take 0.4 mg by mouth daily.    Marland Kitchen UNABLE TO FIND Baclofen pump: Continuous    . warfarin (COUMADIN) 5 MG tablet Take 1 tablet by mouth daily as directed by coumadin clinic. 90 tablet 1   Current Facility-Administered Medications on File Prior to Visit  Medication Dose Route Frequency Provider Last Rate Last Dose  . baclofen (LIORESAL) intrathecal injection 40 mg/63mL  40 mg Intrathecal Once Kathrynn Ducking, MD        PAST MEDICAL HISTORY: Past Medical History:  Diagnosis Date  . Abnormality of gait 03/01/2015  . Anxiety   . Arthritis    "knees" (01/19/2016)  . Atrial fibrillation with RVR ([place]) 01/19/2016  . Back pain   . Chest pain   . Childhood asthma   . Chronic pain    "nerve pain from the MS"  . Constipation   . Depression   . DVT (deep venous thrombosis) ([place]) 1983   "RLE; may have just been phlebitis; it was before the age of dopplers"  . 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 GERD (gastroesophageal reflux disease)   . History of stress test 06/21/10   limited exam with some degree of breast attenuatin of the apex however a component of apical ischemia is not excluded, EF 62%; cardiac cath   . HLD (hyperlipidemia)   . Hx of echocardiogram 04/22/10   EF 55-60%, no valve issues  . Hypertension   . Hypothyroidism   . Joint pain   . Migraine    "none since I stopped beta blocker in 1990s" (01/19/2016)  . Multiple sclerosis ([place])    has had this may 1989-[facility] reg doc  . OSA on CPAP   . Osteoarthritis   . PAF (paroxysmal atrial fibrillation) ([place])    on coumadin; documented on monitor 06/2010  . Pneumonia 11/2011  . Shortness of breath   . Thyroid disease     PAST SURGICAL HISTORY: Past Surgical History:  Procedure Laterality Date  . ANTERIOR  CERVICAL DECOMP/DISCECTOMY FUSION  01/2004  . BACK SURGERY    . CARDIAC CATHETERIZATION  06/22/2010   normal coronary arteries, PAF  . CATARACT EXTRACTION, BILATERAL Bilateral   . CHILD BIRTHS     X2  . DILATION  AND CURETTAGE OF UTERUS    . INFUSION PUMP IMPLANTATION  ~ 2009   baclofen infusion in lower abd  . PAIN PUMP REVISION N/A 07/29/2014   Procedure: Baclofen pump replacement;  Surgeon: Erline Levine, MD;  Location: [facility];  Service: Neurosurgery;  Laterality: N/A;  Baclofen pump replacement  . TONSILLECTOMY AND ADENOIDECTOMY    . VARICOSE VEIN SURGERY  ~ 1968  . VENOUS ABLATION  12/16/2010   radiofreq ablation -Dr Elisabeth Cara and [facility]  . WISDOM TOOTH EXTRACTION      SOCIAL HISTORY: Social History   Tobacco Use  . Smoking status: Former Smoker    Packs/day: 0.50    Years: 7.00    Pack years: 3.50    Types: Cigarettes    Last attempt to quit: 08/21/1976    Years since quitting: 41.9  . Smokeless tobacco: Never Used  Substance Use Topics  . Alcohol use: Yes    Alcohol/week: 1.0 standard drinks    Types: 1 Glasses of wine per week  . Drug use: No    FAMILY HISTORY: Family History  Problem Relation Age of Onset  . Coronary artery disease Father        at age 75  . Hyperlipidemia Father   . Thyroid disease Father   . Coronary artery disease Maternal Grandmother   . Depression Maternal Grandmother   . Cancer Paternal Grandmother   . Depression Mother   . Sudden death Mother   . Anxiety disorder Mother   . Alcoholism Son     ROS: Review of Systems  Constitutional: Positive for weight loss.  Neurological:       Positive for lightheadedness    PHYSICAL EXAM: Blood pressure 101/69, pulse 87, temperature 98 F (36.7 C), temperature source Oral, height 5\' 3"  (1.6 m), weight 199 lb (90.3 kg), SpO2 99 %. Body mass index is 35.25 kg/m. Physical Exam  Constitutional: She is oriented to person, place, and time. She appears well-developed and well-nourished.    Cardiovascular: Normal rate.  Pulmonary/Chest: Effort normal.  Musculoskeletal: Normal range of motion.  Neurological: She is oriented to person, place, and time.  Skin: Skin is warm and dry.  Psychiatric: She has a normal mood and affect. Her behavior is normal.  Vitals reviewed.   RECENT LABS AND TESTS: BMET    Component Value Date/Time   NA 144 04/23/2018 1159   K 4.2 04/23/2018 1159   CL 95 (L) 04/23/2018 1159   CO2 30 (H) 04/23/2018 1159   GLUCOSE 45 (L) 04/23/2018 1159   GLUCOSE 92 07/08/2017 1214   BUN 26 04/23/2018 1159   CREATININE 0.69 04/23/2018 1159   CALCIUM 9.5 04/23/2018 1159   GFRNONAA 88 04/23/2018 1159   GFRAA 102 04/23/2018 1159   Lab Results  Component Value Date   HGBA1C 5.3 04/23/2018   HGBA1C 5.2 10/01/2017   HGBA1C 5.4 03/08/2017   HGBA1C  04/21/2010    5.5 (NOTE)                                                                       According to the ADA Clinical Practice Recommendations for 2011, when HbA1c is used as a screening test:   >=6.5%   Diagnostic of Diabetes Mellitus           (  if abnormal result  is confirmed)  5.7-6.4%   Increased risk of developing Diabetes Mellitus  References:Diagnosis and Classification of Diabetes Mellitus,Diabetes YIRS,8546,27(OJJKK 1):S62-S69 and Standards of Medical Care in         Diabetes - 2011,Diabetes XFGH,8299,37  (Suppl 1):S11-S61.   Lab Results  Component Value Date   INSULIN 7.8 04/23/2018   INSULIN 20.4 11/27/2017   INSULIN 20.4 03/08/2017   CBC    Component Value Date/Time   WBC 7.0 10/01/2017   WBC 8.4 07/08/2017 1214   RBC 4.83 07/08/2017 1214   HGB 15.1 10/01/2017   HGB 14.1 03/08/2017 1152   HCT 47 (A) 10/01/2017   HCT 43.0 03/08/2017 1152   PLT 185 10/01/2017   PLT 219 12/18/2014 1157   MCV 93.2 07/08/2017 1214   MCV 93 03/08/2017 1152   MCH 31.5 07/08/2017 1214   MCHC 33.8 07/08/2017 1214   RDW 13.3 07/08/2017 1214   RDW 13.3 03/08/2017 1152   LYMPHSABS 1.6 07/08/2017 1214    LYMPHSABS 2.2 03/08/2017 1152   MONOABS 1.3 (H) 07/08/2017 1214   EOSABS 0.2 07/08/2017 1214   EOSABS 0.1 03/08/2017 1152   BASOSABS 0.0 07/08/2017 1214   BASOSABS 0.0 03/08/2017 1152   Iron/TIBC/Ferritin/ %Sat No results found for: IRON, TIBC, FERRITIN, IRONPCTSAT Lipid Panel     Component Value Date/Time   CHOL 129 11/27/2017 1011   TRIG 60 11/27/2017 1011   HDL 51 11/27/2017 1011   CHOLHDL 3.0 04/22/2010 0536   VLDL 29 04/22/2010 0536   LDLCALC 66 11/27/2017 1011   Hepatic Function Panel     Component Value Date/Time   PROT 6.0 04/23/2018 1159   ALBUMIN 4.0 04/23/2018 1159   AST 22 04/23/2018 1159   ALT 31 04/23/2018 1159   ALKPHOS 62 04/23/2018 1159   BILITOT 0.3 04/23/2018 1159      Component Value Date/Time   TSH 0.651 11/27/2017 1011   TSH 2.410 03/08/2017 1152   TSH 1.083 08/06/2016 0647   TSH 1.871 01/19/2016 2302   TSH 0.206 (L) 03/10/2011 1351   Results for Meisinger, Toni [name] "Toni [name]" (MRN [id number]) as of 07/10/2018 14:59  Ref. Range 04/23/2018 11:59  Vitamin D, 25-Hydroxy Latest Ref Range: 30.0 - 100.0 ng/mL 65.8   ASSESSMENT AND PLAN: Dehydration  Class 2 severe obesity with serious comorbidity and body mass index (BMI) of 35.0 to 35.9 in adult, unspecified obesity type Toni [name])  PLAN:  Dehydration Toni [name] agreed to contact her cardiologist through [facility] and ask if she needs to change the dose of Lasix. She will work on increasing her H2O intake in the meanwhile.  I spent > than 50% of the 25 minute visit on counseling as documented in the note.  Obesity Toni [name] is currently in the action stage of change. As such, her goal is to continue with weight loss efforts She has agreed to follow the Category 2 plan Toni [name] has been instructed to work up to a goal of 150 minutes of combined cardio and strengthening exercise per week for weight loss and overall health benefits. We discussed the following Behavioral Modification Strategies today:  increase H2O intake, increasing lean protein intake, work on meal planning and easy cooking plans and holiday eating strategies   Toni [name] has agreed to follow up with our clinic in 3 weeks. She was informed of the importance of frequent follow up visits to maximize her success with intensive lifestyle modifications for her multiple health conditions.   OBESITY BEHAVIORAL INTERVENTION VISIT  Today's visit  was # 2   Starting weight: 257 lbs Starting date: 03/08/2017 Today's weight :  199 lbs Today's date: 07/09/2018 Total lbs lost to date: 71    ASK: We discussed the diagnosis of obesity with Toni [name] today and Toni [name] agreed to give [name] permission to discuss obesity behavioral modification therapy today.  ASSESS: Toni [name] has the diagnosis of obesity and her BMI today is 35.26 Toni [name] is in the action stage of change   ADVISE: Toni [name] was educated on the multiple health risks of obesity as well as the benefit of weight loss to improve her health. She was advised of the need for long term treatment and the importance of lifestyle modifications to improve her current health and to decrease her risk of future health problems.  AGREE: Multiple dietary modification options and treatment options were discussed and  Toni [name] agreed to follow the recommendations documented in the above note.  ARRANGE: Toni [name] was educated on the importance of frequent visits to treat obesity as outlined per CMS and USPSTF guidelines and agreed to schedule her next follow up appointment today.  I, Doreene Nest, am acting as transcriptionist for Dennard Nip, MD  I have reviewed the above documentation for accuracy and completeness, and I agree with the above. -Dennard Nip, MD

## 2018-07-11 DIAGNOSIS — I10 Essential (primary) hypertension: Secondary | ICD-10-CM | POA: Diagnosis not present

## 2018-07-11 DIAGNOSIS — E7849 Other hyperlipidemia: Secondary | ICD-10-CM | POA: Diagnosis not present

## 2018-07-11 DIAGNOSIS — I480 Paroxysmal atrial fibrillation: Secondary | ICD-10-CM | POA: Diagnosis not present

## 2018-07-11 DIAGNOSIS — M6281 Muscle weakness (generalized): Secondary | ICD-10-CM | POA: Diagnosis not present

## 2018-07-11 DIAGNOSIS — Z9181 History of falling: Secondary | ICD-10-CM | POA: Diagnosis not present

## 2018-07-11 DIAGNOSIS — G35 Multiple sclerosis: Secondary | ICD-10-CM | POA: Diagnosis not present

## 2018-07-11 DIAGNOSIS — R262 Difficulty in walking, not elsewhere classified: Secondary | ICD-10-CM | POA: Diagnosis not present

## 2018-07-11 DIAGNOSIS — C642 Malignant neoplasm of left kidney, except renal pelvis: Secondary | ICD-10-CM | POA: Diagnosis not present

## 2018-07-11 DIAGNOSIS — Z6834 Body mass index (BMI) 34.0-34.9, adult: Secondary | ICD-10-CM | POA: Diagnosis not present

## 2018-07-11 DIAGNOSIS — R7301 Impaired fasting glucose: Secondary | ICD-10-CM | POA: Diagnosis not present

## 2018-07-11 DIAGNOSIS — R600 Localized edema: Secondary | ICD-10-CM | POA: Diagnosis not present

## 2018-07-11 DIAGNOSIS — M25669 Stiffness of unspecified knee, not elsewhere classified: Secondary | ICD-10-CM | POA: Diagnosis not present

## 2018-07-11 LAB — HEPATIC FUNCTION PANEL
ALT: 33 (ref 7–35)
AST: 19 (ref 13–35)
Alkaline Phosphatase: 55 (ref 25–125)
Bilirubin, Total: 0.5

## 2018-07-11 LAB — LIPID PANEL
Cholesterol: 147 (ref 0–200)
HDL: 53 (ref 35–70)
LDL Cholesterol: 83
LDl/HDL Ratio: 1.6
TRIGLYCERIDES: 56 (ref 40–160)

## 2018-07-11 LAB — BASIC METABOLIC PANEL
Creatinine: 0.6 (ref 0.5–1.1)
Glucose: 120
Potassium: 4.1 (ref 3.4–5.3)
Sodium: 119 — AB (ref 137–147)

## 2018-07-11 LAB — CBC AND DIFFERENTIAL
HCT: 45 (ref 36–46)
Hemoglobin: 14.6 (ref 12.0–16.0)
WBC: 8.9

## 2018-07-15 ENCOUNTER — Ambulatory Visit (INDEPENDENT_AMBULATORY_CARE_PROVIDER_SITE_OTHER): Payer: Medicare Other | Admitting: Orthopaedic Surgery

## 2018-07-15 ENCOUNTER — Encounter (INDEPENDENT_AMBULATORY_CARE_PROVIDER_SITE_OTHER): Payer: Self-pay | Admitting: Orthopaedic Surgery

## 2018-07-15 DIAGNOSIS — M25669 Stiffness of unspecified knee, not elsewhere classified: Secondary | ICD-10-CM | POA: Diagnosis not present

## 2018-07-15 DIAGNOSIS — G5602 Carpal tunnel syndrome, left upper limb: Secondary | ICD-10-CM

## 2018-07-15 DIAGNOSIS — G5601 Carpal tunnel syndrome, right upper limb: Secondary | ICD-10-CM | POA: Diagnosis not present

## 2018-07-15 DIAGNOSIS — M6281 Muscle weakness (generalized): Secondary | ICD-10-CM | POA: Diagnosis not present

## 2018-07-15 DIAGNOSIS — Z9181 History of falling: Secondary | ICD-10-CM | POA: Diagnosis not present

## 2018-07-15 DIAGNOSIS — R262 Difficulty in walking, not elsewhere classified: Secondary | ICD-10-CM | POA: Diagnosis not present

## 2018-07-15 MED ORDER — METHYLPREDNISOLONE ACETATE 40 MG/ML IJ SUSP
40.0000 mg | INTRAMUSCULAR | Status: AC | PRN
  Administered 2018-07-15: 40 mg

## 2018-07-15 MED ORDER — LIDOCAINE HCL 1 % IJ SOLN
1.0000 mL | INTRAMUSCULAR | Status: AC | PRN
  Administered 2018-07-15: 1 mL

## 2018-07-15 NOTE — Progress Notes (Signed)
Office Visit Note   Patient: Toni [name]           Date of Birth: 28-Jun-1946           MRN: [id number] Visit Date: 07/15/2018              Requested by: Prince Solian, MD [address] [place],  [address] PCP: Prince Solian, MD   Assessment & Plan: Visit Diagnoses:  1. Carpal tunnel syndrome, left upper limb   2. Carpal tunnel syndrome, right upper limb     Plan: Per her wishes I was able to place a steroid shot in the transverse carpal ligament on both sides which she tolerated well.  All question concerns were answered and addressed.  Follow-up will be as needed.  Follow-Up Instructions: Return if symptoms worsen or fail to improve.   Orders:  Orders Placed This Encounter  Procedures  . Hand/UE Inj  . Hand/UE Inj   No orders of the defined types were placed in this encounter.     Procedures: Hand/UE Inj: R carpal tunnel for carpal tunnel syndrome on 07/15/2018 1:58 PM Medications: 1 mL lidocaine 1 %; 40 mg methylPREDNISolone acetate 40 MG/ML  Hand/UE Inj: L carpal tunnel for carpal tunnel syndrome on 07/15/2018 1:58 PM Medications: 1 mL lidocaine 1 %; 40 mg methylPREDNISolone acetate 40 MG/ML      Clinical Data: No additional findings.   Subjective: Chief Complaint  Patient presents with  . Left Wrist - Follow-up  The patient is very well-known to me.  She is been suffering from chronic multiple sclerosis.  She also has neuropathy in her hands.  The left side we know is also associated with severe carpal tunnel syndrome.  She is having issues on the right side as well.  Neurontin does help temporize things.  She would like to have a steroid injection today still not interested in any type of surgery.  She would like [name] to consider steroid in both hands today.  She still propels herself with a rolling wheelchair.  HPI  Review of Systems She currently denies any headache, chest pain, shortness of breath, fever, chills, nausea,  vomiting.  Objective: Vital Signs: There were no vitals taken for this visit.  Physical Exam She is alert and oriented x3 and in no acute distress Ortho Exam Examination of both her hand shows weakness with grip and pinch strength and muscle atrophy.  Both hands are well-perfused. Specialty Comments:  No specialty comments available.  Imaging: No results found.   PMFS History: Patient Active Problem List   Diagnosis Date Noted  . Sleep apnea treated with nocturnal BiPAP 04/23/2018  . 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 ([place]) 11/21/2017  . Permanent atrial fibrillation 11/21/2017  . Tightness of leg fascia 11/21/2017  . Muscle spasm of both lower legs 11/21/2017  . Carpal tunnel syndrome, left upper limb 09/13/2017  . Carpal tunnel syndrome, right upper limb 09/13/2017  . Other insomnia 06/11/2017  . Depression 06/11/2017  . Abdominal swelling 03/29/2017  . Central apnea 03/26/2017  . Class 3 obesity with serious comorbidity and body mass index (BMI) of 40.0 to 44.9 in adult 03/22/2017  . Vitamin D deficiency 03/22/2017  . Prediabetes 03/22/2017  . Venous insufficiency 02/14/2017  . Right hand weakness 08/06/2016  . Nonischemic cardiomyopathy ([place]) 05/09/2016  . Localized edema 05/09/2016  . Long term current use of anticoagulant therapy 02/03/2016  . 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 Atrial  fibrillation with RVR ([place]) 01/19/2016  . 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 Obesity hypoventilation syndrome ([place]) 03/30/2015  . Abnormality of gait 03/01/2015  . Chronic constipation with overflow incontinence 01/25/2015  . 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) ([place]) 01/25/2015  . 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 MS (multiple sclerosis) ([place]) 07/29/2014  . Closed right ankle fracture  10/30/2013  . Leucocytosis 10/30/2013  . Varicose veins of both lower extremities with complications 02/54/2706  . OSA on CPAP 06/04/2013  . Muscle spasms of lower extremity 02/24/2013  . Long term (current) use of anticoagulants 11/05/2012  . 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 Aspiration pneumonia vs Failed outpatient Rx of CAP 11/26/2011  . Essential hypertension   . Thyroid disease   . HLD (hyperlipidemia)    Past Medical History:  Diagnosis Date  . Abnormality of gait 03/01/2015  . Anxiety   . Arthritis    "knees" (01/19/2016)  . Atrial fibrillation with RVR ([place]) 01/19/2016  . Back pain   . Chest pain   . Childhood asthma   . Chronic pain    "nerve pain from the MS"  . Constipation   . Depression   . DVT (deep venous thrombosis) ([place]) 1983   "RLE; may have just been phlebitis; it was before the age of dopplers"  . 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 GERD (gastroesophageal reflux disease)   . History of stress test 06/21/10   limited exam with some degree of breast attenuatin of the apex however a component of apical ischemia is not excluded, EF 62%; cardiac cath   . HLD (hyperlipidemia)   . Hx of echocardiogram 04/22/10   EF 55-60%, no valve issues  . Hypertension   . Hypothyroidism   . Joint pain   . Migraine    "none since I stopped beta blocker in 1990s" (01/19/2016)  . Multiple sclerosis ([place])    has had this may 1989-[place] reg doc  . OSA on CPAP   . Osteoarthritis   . PAF (paroxysmal atrial fibrillation) ([place])    on coumadin; documented on monitor 06/2010  . Pneumonia 11/2011  . Shortness of breath   . Thyroid disease     Family History  Problem Relation Age of Onset  . Coronary artery disease Father        at age 48  . Hyperlipidemia Father   . Thyroid disease Father   . Coronary artery disease Maternal Grandmother   . Depression Maternal Grandmother    . Cancer Paternal Grandmother   . Depression Mother   . Sudden death Mother   . Anxiety disorder Mother   . Alcoholism Son     Past Surgical History:  Procedure Laterality Date  . ANTERIOR CERVICAL DECOMP/DISCECTOMY FUSION  01/2004  . BACK SURGERY    . CARDIAC CATHETERIZATION  06/22/2010   normal coronary arteries, PAF  . CATARACT EXTRACTION, BILATERAL Bilateral   . CHILD BIRTHS     X2  . DILATION AND CURETTAGE OF UTERUS    . INFUSION PUMP IMPLANTATION  ~ 2009   baclofen infusion in lower abd  . PAIN PUMP REVISION N/A 07/29/2014   Procedure: Baclofen pump replacement;  Surgeon: Erline Levine, MD;  Location: [facility];  Service: Neurosurgery;  Laterality: N/A;  Baclofen  pump replacement  . TONSILLECTOMY AND ADENOIDECTOMY    . VARICOSE VEIN SURGERY  ~ 1968  . VENOUS ABLATION  12/16/2010   radiofreq ablation -Dr Elisabeth Cara and [facility]  . WISDOM TOOTH EXTRACTION     Social History   Occupational History  . Occupation: RETIRED  Tobacco Use  . Smoking status: Former Smoker    Packs/day: 0.50    Years: 7.00    Pack years: 3.50    Types: Cigarettes    Last attempt to quit: 08/21/1976    Years since quitting: 41.9  . Smokeless tobacco: Never Used  Substance and Sexual Activity  . Alcohol use: Yes    Alcohol/week: 1.0 standard drinks    Types: 1 Glasses of wine per week  . Drug use: No  . Sexual activity: Not Currently

## 2018-07-17 DIAGNOSIS — M6281 Muscle weakness (generalized): Secondary | ICD-10-CM | POA: Diagnosis not present

## 2018-07-17 DIAGNOSIS — R262 Difficulty in walking, not elsewhere classified: Secondary | ICD-10-CM | POA: Diagnosis not present

## 2018-07-17 DIAGNOSIS — M25669 Stiffness of unspecified knee, not elsewhere classified: Secondary | ICD-10-CM | POA: Diagnosis not present

## 2018-07-17 DIAGNOSIS — Z9181 History of falling: Secondary | ICD-10-CM | POA: Diagnosis not present

## 2018-07-22 ENCOUNTER — Other Ambulatory Visit: Payer: Self-pay | Admitting: Neurology

## 2018-07-22 ENCOUNTER — Telehealth: Payer: Self-pay | Admitting: Neurology

## 2018-07-22 DIAGNOSIS — G4733 Obstructive sleep apnea (adult) (pediatric): Secondary | ICD-10-CM

## 2018-07-22 DIAGNOSIS — G35 Multiple sclerosis: Secondary | ICD-10-CM

## 2018-07-22 DIAGNOSIS — G4737 Central sleep apnea in conditions classified elsewhere: Secondary | ICD-10-CM

## 2018-07-22 DIAGNOSIS — I4891 Unspecified atrial fibrillation: Secondary | ICD-10-CM

## 2018-07-22 DIAGNOSIS — G4730 Sleep apnea, unspecified: Secondary | ICD-10-CM

## 2018-07-22 DIAGNOSIS — G35B0 Primary progressive multiple sclerosis, unspecified: Secondary | ICD-10-CM

## 2018-07-22 MED ORDER — BACLOFEN 20 MG PO TABS: ORAL_TABLET | ORAL | 0 refills | Status: DC

## 2018-07-22 NOTE — Telephone Encounter (Signed)
Called the patient and informed her that with her successful weight loss, Dr Brett Fairy thinks it is worth having the patient come in for a a Bipap retitration to see if that is necessary. She also reviewed her download and saw she was having 3.4 central apnea at the current pressure. She would like to decrease her current Bipap pressure 20/15 down to 15/10 cm water pressure. Order placed and sent to [facility] and informed the patient of the change.

## 2018-07-22 NOTE — Telephone Encounter (Addendum)
I called patient back. She has lost some weight, about 65lb in the past year or more. Wondering if pressure setting needs to be changed for her Bipap. She states it now "Feels forceful". She is continuing to lose weight. Advised we will speak with Dr. Brett Fairy and call her back tomorrow at the latest. She uses [facility] for DME.    She is requesting refill for baclofen tablet. She uses only prn. Current rx expired. Escribed refill as requested.

## 2018-07-22 NOTE — Telephone Encounter (Signed)
Patient called and requested to speak with Dr. Allie Bossier nurse regarding the settings on her Ipap. She would also like to know if she can get a script for Baclofen [facility]. Please call and advise.

## 2018-07-23 DIAGNOSIS — M6281 Muscle weakness (generalized): Secondary | ICD-10-CM | POA: Diagnosis not present

## 2018-07-23 DIAGNOSIS — R262 Difficulty in walking, not elsewhere classified: Secondary | ICD-10-CM | POA: Diagnosis not present

## 2018-07-23 DIAGNOSIS — Z9181 History of falling: Secondary | ICD-10-CM | POA: Diagnosis not present

## 2018-07-23 DIAGNOSIS — M25669 Stiffness of unspecified knee, not elsewhere classified: Secondary | ICD-10-CM | POA: Diagnosis not present

## 2018-07-29 DIAGNOSIS — Z9181 History of falling: Secondary | ICD-10-CM | POA: Diagnosis not present

## 2018-07-29 DIAGNOSIS — M25669 Stiffness of unspecified knee, not elsewhere classified: Secondary | ICD-10-CM | POA: Diagnosis not present

## 2018-07-29 DIAGNOSIS — R262 Difficulty in walking, not elsewhere classified: Secondary | ICD-10-CM | POA: Diagnosis not present

## 2018-07-29 DIAGNOSIS — M6281 Muscle weakness (generalized): Secondary | ICD-10-CM | POA: Diagnosis not present

## 2018-08-01 DIAGNOSIS — M25669 Stiffness of unspecified knee, not elsewhere classified: Secondary | ICD-10-CM | POA: Diagnosis not present

## 2018-08-01 DIAGNOSIS — Z9181 History of falling: Secondary | ICD-10-CM | POA: Diagnosis not present

## 2018-08-01 DIAGNOSIS — R262 Difficulty in walking, not elsewhere classified: Secondary | ICD-10-CM | POA: Diagnosis not present

## 2018-08-01 DIAGNOSIS — M6281 Muscle weakness (generalized): Secondary | ICD-10-CM | POA: Diagnosis not present

## 2018-08-06 ENCOUNTER — Ambulatory Visit (INDEPENDENT_AMBULATORY_CARE_PROVIDER_SITE_OTHER): Payer: Medicare Other | Admitting: Family Medicine

## 2018-08-06 ENCOUNTER — Encounter (INDEPENDENT_AMBULATORY_CARE_PROVIDER_SITE_OTHER): Payer: Self-pay

## 2018-08-06 ENCOUNTER — Encounter (INDEPENDENT_AMBULATORY_CARE_PROVIDER_SITE_OTHER): Payer: Self-pay | Admitting: Family Medicine

## 2018-08-06 ENCOUNTER — Ambulatory Visit (INDEPENDENT_AMBULATORY_CARE_PROVIDER_SITE_OTHER): Payer: Medicare Other | Admitting: Psychology

## 2018-08-06 VITALS — BP 107/73 | HR 78 | Temp 97.6°F | Ht 63.0 in | Wt 198.0 lb

## 2018-08-06 DIAGNOSIS — F3289 Other specified depressive episodes: Secondary | ICD-10-CM

## 2018-08-06 DIAGNOSIS — R1032 Left lower quadrant pain: Secondary | ICD-10-CM | POA: Diagnosis not present

## 2018-08-06 DIAGNOSIS — Z6835 Body mass index (BMI) 35.0-35.9, adult: Secondary | ICD-10-CM

## 2018-08-06 NOTE — Progress Notes (Signed)
Office: [phone]  /  Fax: [phone]   Date: August 06, 2018 Time Seen: 12:00pm Duration: 23 minutes Provider: Glennie Isle, Psy.D. Type of Session: Individual Therapy   HPI: Elizabethwas referred by Dr. Lillia Carmel to depression with emotional eating behaviorsand was seen for an initial appointment with this provider on April 23, 2018. Per the note for theofficevisit withDr. Desmond Dike April 01, 2018,"Elizabethis struggling with night time eating, especially between 9 and 11 pm. She knows it is emotional eating but can't seem to change her behavior. Toni [name] struggles withemotional eating and using food for comfort to the extent that it is negatively impacting herhealth. Sheoften snacks when sheis not hungry. Elizabethsometimes feels sheis out of control and then feels guilty that shemade poor food choices. [name] been working on behavior modification techniques to help reduce heremotional eating and has been somewhat successful.Sheshows no sign of suicidal or homicidal ideations."Moreover, perthe note for the initial visit withDr. Desmond Dike March 08, 2017, "Baumbach struggled with obesity for years and has been unsuccessful in either losing weight or maintaining long term weight loss." Sheshared shehas been heavy most of herlife andstarted gaining weight in her 83s. Herheaviest weight ever was 301pounds. During that initial appointment, she also endorsed the following:significant food cravings issues; snackingfrequently in the evenings; skippingmeals frequently;frequently drinking liquids with calories;problems with excessive hunger; frequently eatinglarger portions than normal;binge eating behaviors; andemotional eating.During the initial appointment with this provider,Ashleymarie shared, "I have real problems in the evening and I don't know why. I don't know if it is physiological or psychological." She described she can go long  periods during the day and not eat, but experiences difficulty in the evening. The aforementioned has been going on Toni [name]'s "whole life." She noted she cannot recall a time where she was "not heavy." Toni [name] denied a history of binge eating, purging behaviors, and other compensatory behaviors. She denied a history of a diagnosis of an eating disorder.Elizabethwas asked to complete a questionnaire assessing various behaviors related to emotional eating. Elizabethendorsed the following: experience food cravings on a regular basis, use food to help you cope with emotional situations, overeat when you are worried about something, overeat frequently when you are bored or lonely and overeat when you are alone, but eat much less when you are with other people.During today's appointment, Toni [name] continues to eat at night, but denied any other episodes of emotional eating.  Session Content: Session focused termination and reviewing learned skills. The session was initiated with the administration of the PHQ-9 and GAD-7, as well as a brief check-in. [name] shared she lost one pound since the last appointment with Dr. Leafy Ro, and labs and metabolism will reportedly be rechecked at their next appointment. [name] further shared changes need to be made with her CPAP machine due to recent improvements. Additionally, Toni [name] shared she continues to engage in thought defusion, especially the "Leaves on a Stream" exercise. This provider and Jeronda also discussed implementation of behavioral strategies for Thanksgiving, including generalizing skills to the upcoming holidays. Toni [name] agreed. Moreover, she was provided with a handout with the hunger and satisfaction scale, and psychoeducation regarding utilization of it for holidays in conjunction with behavioral strategies was provided. Toni [name] was receptive to today's session as evidenced by openness to sharing, responsiveness to feedback,  and willingness to utilize the hunger and satisfaction scale.  Mental Status Examination: Toni [name] arrived on time for the appointment. She presented as appropriately dressed and groomed. Toni [name] appeared her stated age and demonstrated adequate orientation to time, place,  person, and purpose of the appointment. She also demonstrated appropriate eye contact. No psychomotor abnormalities or behavioral peculiarities noted; however, she was observed ambulating with a wheelchair. Her mood was euthymic with congruent affect. Her thought processes were logical, linear, and goal-directed. No hallucinations, delusions, bizarre thinking or behavior reported or observed. Judgment, insight, and impulse control appeared to be grossly intact. There was no evidence of paraphasias (i.e., errors in speech, gross mispronunciations, and word substitutions), repetition deficits, or disturbances in volume or prosody (i.e., rhythm and intonation). There was no evidence of attention or memory impairments. Toni [name] denied current suicidal and homicidal ideation, intent or plan.  Structured Assessment Results: The Patient Health Questionnaire-9 (PHQ-9) is a self-report measure that assesses symptoms and severity of depression over the course of the last two weeks. Toni [name] obtained a score of 2 suggesting minimal depression. Toni [name] finds the endorsed symptoms to be not difficult at all. Depression screen [name] 2/9 08/06/2018  Decreased Interest 0  Down, Depressed, Hopeless 0  PHQ - 2 Score 0  Altered sleeping 0  Tired, decreased energy 1  Change in appetite 0  Feeling bad or failure about yourself  0  Trouble concentrating 0  Moving slowly or fidgety/restless 1  Suicidal thoughts 0  PHQ-9 Score 2  Some recent data might be hidden   The Generalized Anxiety Disorder-7 (GAD-7) is a brief self-report measure that assesses symptoms of anxiety over the course of the last two weeks. Toni [name] obtained a score of 1  suggesting minimal anxiety. GAD 7 : Generalized Anxiety Score 08/06/2018  Nervous, Anxious, on Edge 0  Control/stop worrying 0  Worry too much - different things 0  Trouble relaxing 0  Restless 0  Easily annoyed or irritable 1  Afraid - awful might happen 0  Total GAD 7 Score 1  Anxiety Difficulty Not difficult at all   Interventions: [place] was administered the PHQ-9 and GAD-7 for symptom monitoring. Throughout today's session, empathic reflections and validation were provided. Skills learned to date were reviewed, and psychoeducation regarding the utilization of the hunger and satisfaction was provided.   DSM-5 Diagnosis: 311 (F32.8) Other Specified Depressive Disorder, Emotional Eating Behaviors  Treatment Goal & Progress: Kenadi was seen for an initial appointment with this provider on April 23, 2018 during which the following treatment goal was established: decrease emotional eating. Bathsheba has demonstrated progress in her goal as evidenced by increased awareness in hunger patterns and triggers for emotional eating. During previous appointments, she also reported an overall reduction in emotional eating since the onset of treatment with this provider. During today's appointment, Wendell shared she continues to engage in learned skills, and overall there has been a reduction in PHQ-9 and GAD-7 scores.   Plan: Today was Cherilynn's last appointment with this provider; however, this provider discussed the option of a referral for longer-term therapeutic services. Anjulie declined the referral at this time, but indicated she would inform Dr. Leafy Ro in the future if further services are desired.

## 2018-08-07 NOTE — Progress Notes (Signed)
Office: [phone]  /  Fax: [phone]   HPI:   Chief Complaint: OBESITY Toni [name] is here to discuss her progress with her obesity treatment plan. She is on the Category 2 plan and is following her eating plan approximately 90 to 95 % of the time. She states she is doing physical therapy and leg exercises for 20 minutes 6 to 7 times per week. Toni [name] continues to do well with weight loss in her Category 2 plan, but is frustrated that her weight loss has slowed down. Her hunger is controlled.  Her weight is 198 lb (89.8 kg) today and has had a weight loss of 1 pound over a period of 4 weeks since her last visit. She has lost 59 lbs since starting treatment with [name].  Left Lower Quadrant Abdominal Pain Toni [name] notes intermittent LLQ pain for 3 to 4 weeks, which waxes and wanes, but seems to be worsening. She has a history of diverticulitis many years ago and multiple sclerosis. She uses lactulose daily. Her last colonoscopy was about 10 years ago and showed negative for polyps or rectal bleeding. She denies fever or diarrhea.  ASSESSMENT AND PLAN:  Left lower quadrant abdominal pain - Plan: Ambulatory referral to Gastroenterology  Class 2 severe obesity with serious comorbidity and body mass index (BMI) of 35.0 to 35.9 in adult, unspecified obesity type (Toni [name])  PLAN:  Left Lower Quadrant Abdominal Pain Toni [name] was referred to [facility] for evaluation and will follow up with our office in 4 weeks for an office visit.  I spent > than 50% of the 25 minute visit on counseling as documented in the note.  Obesity Toni [name] is currently in the action stage of change. As such, her goal is to continue with weight loss efforts. She has agreed to follow the Category 2 plan. Toni [name] has been instructed to work up to a goal of 150 minutes of combined cardio and strengthening exercise per week for weight loss and overall health benefits. We discussed the following Behavioral  Modification Strategies today: increasing lean protein intake, decreasing simple carbohydrates, increase H2O intake, and work on meal planning and easy cooking plans.  Toni [name] has agreed to follow up with our clinic in 4 weeks. She was informed of the importance of frequent follow up visits to maximize her success with intensive lifestyle modifications for her multiple health conditions.  ALLERGIES: Allergies  Allergen Reactions  . Hydrocodone Nausea And Vomiting    Hydrocodone causes vomiting  . Oxycodone Nausea And Vomiting    Oral oxycodone causes vomiting  . Chlorhexidine Gluconate Rash    Sores at site  . Zoloft [Sertraline] Hives, Swelling and Rash    MEDICATIONS: Current Outpatient Medications on File Prior to Visit  Medication Sig Dispense Refill  . acetaminophen (TYLENOL) 500 MG tablet Take 500-1,000 mg by mouth every 6 (six) hours as needed for fever or headache (or pain).    Toni [name] Kitchen ALPRAZolam (XANAX) 0.5 MG tablet Take 1 tablet (0.5 mg total) by mouth 2 (two) times daily as needed for anxiety. 60 tablet 0  . baclofen (LIORESAL) 20 MG tablet Once to two tablets a day for breakthrough spasms 180 tablet 0  . Biotin (PA BIOTIN) 1000 MCG tablet Take 1,000 mcg by mouth daily.    Toni [name] Kitchen buPROPion (WELLBUTRIN XL) 300 MG 24 hr tablet Take 1 tablet (300 mg total) by mouth daily. 30 tablet 0  . CALCIUM CITRATE PO Take 2 tablets by mouth 2 (two) times daily.    Toni [name] Kitchen  cephALEXin (KEFLEX) 500 MG capsule Take 500 mg by mouth 3 (three) times daily.    . cetirizine (ZYRTEC) 10 MG tablet Take 10 mg by mouth daily.    . Cholecalciferol (VITAMIN D) 2000 units tablet Take 1 tablet (2,000 Units total) by mouth 2 (two) times daily.  0  . co-enzyme Q-10 30 MG capsule Take 30 mg by mouth daily.    . Cranberry 1000 MG CAPS Take by mouth.    . diltiazem (CARDIZEM CD) 180 MG 24 hr capsule TAKE 1 CAPSULE(180 MG) BY MOUTH DAILY 90 capsule 3  . docusate sodium (COLACE) 100 MG capsule Take 1 capsule (100 mg total) by  mouth daily. 10 capsule 0  . fluticasone (FLONASE) 50 MCG/ACT nasal spray Place 2 sprays into both nostrils daily as needed for allergies.     . furosemide (LASIX) 40 MG tablet Take 80 mg by mouth daily.     Toni [name] Kitchen gabapentin (NEURONTIN) 300 MG capsule TAKE 1 CAPSULE BY MOUTH UP  TO 4 TIMES DAILY 360 capsule 3  . GENERLAC 10 GM/15ML SOLN TAKE 15 MLS BY MOUTH TWICE DAILY AS NEEDED 946 mL 0  . HP ACTHAR 80 UNIT/ML injectable gel INJECT 80 UNITS (1ML) SUBCUTANEOUSLY EVERY OTHER DAY FOR A TOTAL OF 10 DAYS. REPEAT EVERY 30 DAYS. (DISCARD 28 DAYS AFTER 1ST USE) 5 mL 5  . lactulose, encephalopathy, (GENERLAC) 10 GM/15ML SOLN TAKE 15 MLS BY MOUTH TWICE DAILY AS NEEDED 946 mL 3  . levothyroxine (SYNTHROID, LEVOTHROID) 75 MCG tablet Take 75 mcg by mouth daily.    . Multiple Vitamins-Minerals (CENTRUM SILVER PO) Take 1 tablet by mouth daily.    . nebivolol (BYSTOLIC) 10 MG tablet Take 20 mg by mouth daily.     . Omega-3 Fatty Acids (FISH OIL) 1000 MG CAPS Take 1,000 mg by mouth daily.    Toni [name] Kitchen omeprazole (PRILOSEC) 20 MG capsule Take 20 mg by mouth daily.     . psyllium (METAMUCIL SMOOTH TEXTURE) 28 % packet Take 1 packet by mouth every other day.    . ranitidine (ZANTAC) 150 MG tablet Take 150 mg by mouth at bedtime.     . simvastatin (ZOCOR) 10 MG tablet     . SYRINGE/NEEDLE, DISP, 1 ML (BD LUER-LOK SYRINGE) 25G X 5/8" 1 ML MISC Use as directed to inject  Acthar 10 each prn  . Tamsulosin HCl (FLOMAX) 0.4 MG CAPS Take 0.4 mg by mouth daily.    Toni [name] Kitchen UNABLE TO FIND Baclofen pump: Continuous    . warfarin (COUMADIN) 5 MG tablet Take 1 tablet by mouth daily as directed by coumadin clinic. 90 tablet 1   Current Facility-Administered Medications on File Prior to Visit  Medication Dose Route Frequency Provider Last Rate Last Dose  . baclofen (LIORESAL) intrathecal injection 40 mg/93mL  40 mg Intrathecal Once Kathrynn Ducking, MD        PAST MEDICAL HISTORY: Past Medical History:  Diagnosis Date  . Abnormality of  gait 03/01/2015  . Anxiety   . Arthritis    "knees" (01/19/2016)  . Atrial fibrillation with RVR ([place]) 01/19/2016  . Back pain   . Chest pain   . Childhood asthma   . Chronic pain    "nerve pain from the MS"  . Constipation   . Depression   . DVT (deep venous thrombosis) ([place]) 1983   "RLE; may have just been phlebitis; it was before the age of dopplers"  . 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 GERD (gastroesophageal reflux disease)   . History of stress test 06/21/10   limited exam with some degree of breast attenuatin of the apex however a component of apical ischemia is not excluded, EF 62%; cardiac cath   . HLD (hyperlipidemia)   . Hx of echocardiogram 04/22/10   EF 55-60%, no valve issues  . Hypertension   . Hypothyroidism   . Joint pain   . Migraine    "none since I stopped beta blocker in 1990s" (01/19/2016)  . Multiple sclerosis ([place])    has had this may 1989-[facility] reg doc  . OSA on CPAP   . Osteoarthritis   . PAF (paroxysmal atrial fibrillation) ([place])    on coumadin; documented on monitor 06/2010  . Pneumonia 11/2011  . Shortness of breath   . Thyroid disease     PAST SURGICAL HISTORY: Past Surgical History:  Procedure Laterality Date  . ANTERIOR CERVICAL DECOMP/DISCECTOMY FUSION  01/2004  . BACK SURGERY    . CARDIAC CATHETERIZATION  06/22/2010   normal coronary arteries, PAF  . CATARACT EXTRACTION, BILATERAL Bilateral   . CHILD BIRTHS     X2  . DILATION AND CURETTAGE OF UTERUS    . INFUSION PUMP IMPLANTATION  ~ 2009   baclofen infusion in lower abd  . PAIN PUMP REVISION N/A 07/29/2014   Procedure: Baclofen pump replacement;  Surgeon: Erline Levine, MD;  Location: [facility];  Service: Neurosurgery;  Laterality: N/A;  Baclofen pump replacement  . TONSILLECTOMY AND ADENOIDECTOMY    . VARICOSE VEIN SURGERY  ~ 1968  . VENOUS ABLATION  12/16/2010   radiofreq ablation -Dr Elisabeth Cara and [facility]  . WISDOM TOOTH  EXTRACTION      SOCIAL HISTORY: Social History   Tobacco Use  . Smoking status: Former Smoker    Packs/day: 0.50    Years: 7.00    Pack years: 3.50    Types: Cigarettes    Last attempt to quit: 08/21/1976    Years since quitting: 41.9  . Smokeless tobacco: Never Used  Substance Use Topics  . Alcohol use: Yes    Alcohol/week: 1.0 standard drinks    Types: 1 Glasses of wine per week  . Drug use: No    FAMILY HISTORY: Family History  Problem Relation Age of Onset  . Coronary artery disease Father        at age 63  . Hyperlipidemia Father   . Thyroid disease Father   . Coronary artery disease Maternal Grandmother   . Depression Maternal Grandmother   . Cancer Paternal Grandmother   . Depression Mother   . Sudden death Mother   . Anxiety disorder Mother   . Alcoholism Son     ROS: Review of Systems  Constitutional: Positive for weight loss. Negative for fever.  Gastrointestinal: Positive for abdominal pain. Negative for diarrhea.   PHYSICAL EXAM: Blood pressure 107/73, pulse 78, temperature 97.6 F (36.4 C), temperature source Oral, height 5\' 3"  (1.6 m), weight 198 lb (89.8 kg), SpO2 100 %. Body mass index is 35.07 kg/m. Physical Exam Vitals signs reviewed.  Constitutional:      Appearance: Normal appearance. She is obese.  Cardiovascular:     Rate and Rhythm: Normal rate.  Pulmonary:     Effort: Pulmonary effort is normal.  Musculoskeletal: Normal range of motion.  Skin:    General: Skin is warm and dry.  Neurological:     Mental  Status: She is alert and oriented to person, place, and time.  Psychiatric:        Mood and Affect: Mood normal.        Behavior: Behavior normal.     RECENT LABS AND TESTS: BMET    Component Value Date/Time   NA 119 (A) 07/11/2018   K 4.1 07/11/2018   CL 95 (L) 04/23/2018 1159   CO2 30 (H) 04/23/2018 1159   GLUCOSE 45 (L) 04/23/2018 1159   GLUCOSE 92 07/08/2017 1214   BUN 26 04/23/2018 1159   CREATININE 0.6 07/11/2018     CREATININE 0.69 04/23/2018 1159   CALCIUM 9.5 04/23/2018 1159   GFRNONAA 88 04/23/2018 1159   GFRAA 102 04/23/2018 1159   Lab Results  Component Value Date   HGBA1C 5.3 04/23/2018   HGBA1C 5.2 10/01/2017   HGBA1C 5.4 03/08/2017   HGBA1C  04/21/2010    5.5 (NOTE)                                                                       According to the ADA Clinical Practice Recommendations for 2011, when HbA1c is used as a screening test:   >=6.5%   Diagnostic of Diabetes Mellitus           (if abnormal result  is confirmed)  5.7-6.4%   Increased risk of developing Diabetes Mellitus  References:Diagnosis and Classification of Diabetes Mellitus,Diabetes PIRJ,1884,16(SAYTK 1):S62-S69 and Standards of Medical Care in         Diabetes - 2011,Diabetes Care,2011,34  (Suppl 1):S11-S61.   Lab Results  Component Value Date   INSULIN 7.8 04/23/2018   INSULIN 20.4 11/27/2017   INSULIN 20.4 03/08/2017   CBC    Component Value Date/Time   WBC 8.9 07/11/2018   WBC 8.4 07/08/2017 1214   RBC 4.83 07/08/2017 1214   HGB 14.6 07/11/2018   HGB 14.1 03/08/2017 1152   HCT 45 07/11/2018   HCT 43.0 03/08/2017 1152   PLT 185 10/01/2017   PLT 219 12/18/2014 1157   MCV 93.2 07/08/2017 1214   MCV 93 03/08/2017 1152   MCH 31.5 07/08/2017 1214   MCHC 33.8 07/08/2017 1214   RDW 13.3 07/08/2017 1214   RDW 13.3 03/08/2017 1152   LYMPHSABS 1.6 07/08/2017 1214   LYMPHSABS 2.2 03/08/2017 1152   MONOABS 1.3 (H) 07/08/2017 1214   EOSABS 0.2 07/08/2017 1214   EOSABS 0.1 03/08/2017 1152   BASOSABS 0.0 07/08/2017 1214   BASOSABS 0.0 03/08/2017 1152   Iron/TIBC/Ferritin/ %Sat No results found for: IRON, TIBC, FERRITIN, IRONPCTSAT Lipid Panel     Component Value Date/Time   CHOL 147 07/11/2018   CHOL 129 11/27/2017 1011   TRIG 56 07/11/2018   HDL 53 07/11/2018   HDL 51 11/27/2017 1011   CHOLHDL 3.0 04/22/2010 0536   VLDL 29 04/22/2010 0536   LDLCALC 83 07/11/2018   LDLCALC 66 11/27/2017 1011    Hepatic Function Panel     Component Value Date/Time   PROT 6.0 04/23/2018 1159   ALBUMIN 4.0 04/23/2018 1159   AST 19 07/11/2018   ALT 33 07/11/2018   ALKPHOS 55 07/11/2018   BILITOT 0.3 04/23/2018 1159      Component Value Date/Time   TSH 0.651  11/27/2017 1011   TSH 2.410 03/08/2017 1152   TSH 1.083 08/06/2016 0647   TSH 1.871 01/19/2016 2302   TSH 0.206 (L) 03/10/2011 1351   Results for Rozman, SHABNAM LADD "[name] (MRN [id number]) as of 08/07/2018 11:50  Ref. Range 04/23/2018 11:59  Vitamin D, 25-Hydroxy Latest Ref Range: 30.0 - 100.0 ng/mL 65.8    OBESITY BEHAVIORAL INTERVENTION VISIT  Today's visit was # 26   Starting weight: 257 lbs Starting date: 03/08/17 Today's weight : Weight: 198 lb (89.8 kg)  Today's date: 08/06/2018 Total lbs lost to date: 78  ASK: We discussed the diagnosis of obesity with [name] today and Amisha agreed to give [name] permission to discuss obesity behavioral modification therapy today.  ASSESS: Tylasia has the diagnosis of obesity and her BMI today is 35.0. Thaily is in the action stage of change.   ADVISE: Sashay was educated on the multiple health risks of obesity as well as the benefit of weight loss to improve her health. She was advised of the need for long term treatment and the importance of lifestyle modifications to improve her current health and to decrease her risk of future health problems.  AGREE: Multiple dietary modification options and treatment options were discussed and Cheryal agreed to follow the recommendations documented in the above note.  ARRANGE: Elverda was educated on the importance of frequent visits to treat obesity as outlined per CMS and USPSTF guidelines and agreed to schedule her next follow up appointment today.  I, Marcille Blanco, am acting as transcriptionist for Starlyn Skeans, MD I have reviewed the above documentation for accuracy and completeness, and I agree with the above.  -Dennard Nip, MD

## 2018-08-08 ENCOUNTER — Encounter: Payer: Self-pay | Admitting: Gastroenterology

## 2018-08-08 ENCOUNTER — Ambulatory Visit (INDEPENDENT_AMBULATORY_CARE_PROVIDER_SITE_OTHER): Payer: Medicare Other | Admitting: Pharmacist

## 2018-08-08 DIAGNOSIS — Z7901 Long term (current) use of anticoagulants: Secondary | ICD-10-CM

## 2018-08-08 DIAGNOSIS — I4821 Permanent atrial fibrillation: Secondary | ICD-10-CM

## 2018-08-08 LAB — POCT INR: INR: 2.8 (ref 2.0–3.0)

## 2018-08-09 ENCOUNTER — Other Ambulatory Visit: Payer: Self-pay | Admitting: Neurology

## 2018-08-12 ENCOUNTER — Ambulatory Visit (INDEPENDENT_AMBULATORY_CARE_PROVIDER_SITE_OTHER): Payer: Medicare Other | Admitting: Neurology

## 2018-08-12 DIAGNOSIS — G4733 Obstructive sleep apnea (adult) (pediatric): Secondary | ICD-10-CM

## 2018-08-12 DIAGNOSIS — I4891 Unspecified atrial fibrillation: Principal | ICD-10-CM

## 2018-08-12 DIAGNOSIS — G4737 Central sleep apnea in conditions classified elsewhere: Secondary | ICD-10-CM

## 2018-08-12 DIAGNOSIS — G35B0 Primary progressive multiple sclerosis, unspecified: Secondary | ICD-10-CM

## 2018-08-12 DIAGNOSIS — G35 Multiple sclerosis: Secondary | ICD-10-CM

## 2018-08-15 DIAGNOSIS — G4733 Obstructive sleep apnea (adult) (pediatric): Secondary | ICD-10-CM | POA: Insufficient documentation

## 2018-08-15 DIAGNOSIS — G4730 Sleep apnea, unspecified: Secondary | ICD-10-CM | POA: Insufficient documentation

## 2018-08-15 NOTE — Procedures (Signed)
PATIENT'S NAME:  Toni [name], Toni [name] DOB:      09-27-1945      MR#:    [id number]     DATE OF RECORDING: 08/12/2018 [facility] REFERRING M.D.:  Toni Solian MD Study Performed:  Split-Night Titration Study HISTORY:  04/23/18, Toni [name] ,a 72 year old female MS patient and with complex sleep apnea on BIPAP   She today having lost again a significant amount of weight.  She follows Dr. Leafy [name] at the medical weight management clinic.  With her weight loss came also the needs to refit her CPAP mask.  She is using a BiPAP setting of 20/15 cmH2O and her residual AHI is 4.9/h the best for long time.  She does have minimal leaks which speaks for the good fit of her current mask.  She uses the machine 100% of the time of 7 hours and 2 minutes of average use.  I would not like to change any settings and also the patient reports that she has pressure marks and sometimes almost bruising from her facial mask seems to be the best she has done in a long time.  Her Epworth sleepiness score also has improved /she only endorsed 12 out of 24 points and she endorsed 20 out of 63 points of the fatigue severity score.  I see her as much less fatigued, and more optimistic.  Overall stronger -no longer with home health- now with "[facility]" 3 times a week. She now can stand long enough to pull up her pants. Her last labs were reviewed. She is in atrial fibrillation- rate controlled on diltiazem 180 mg, BP is lower.    Patient returning for a BIPAP Titration sleep study scored at 4%, with a 1 hour Baseline study at the beginning.  The patient endorsed the Epworth Sleepiness Scale at 12/24 points   The patient's weight 207 pounds with a height of 63 (inches), resulting in a BMI of 36.7 kg/m2. The patient's neck circumference measured 18 inches.  CURRENT MEDICATIONS: Xanax, Lioresal, Zyrtec, Cardizem, Flonase, Lasix, Neurontin, Synthroid, Bystolic, Prilosec, Zantac, Zocor, Flomax, Coumadin   PROCEDURE:  This is a multichannel digital  polysomnogram utilizing the Somnostar 11.2 system.  Electrodes and sensors were applied and monitored per AASM Specifications.   EEG, EOG, Chin and Limb EMG, were sampled at 200 Hz.  ECG, Snore and Nasal Pressure, Thermal Airflow, Respiratory Effort, CPAP Flow and Pressure, Oximetry was sampled at 50 Hz. Digital video and audio were recorded.      BASELINE STUDY WITHOUT CPAP RESULTS: Lights Out was at 22:03 and Lights On at 04:53.  Total recording time (TRT) was 142.5, with a total sleep time (TST) of 62 minutes.   The patient's sleep latency was 17 minutes.  REM latency was 0 minutes.  The sleep efficiency was 43.5 %.    SLEEP ARCHITECTURE: WASO (Wake after sleep onset) was 50 minutes, Stage N1 was 2 minutes, Stage N2 was 35.5 minutes, Stage N3 was 24.5 minutes and Stage R (REM sleep) was 0 minutes.  The percentages were Stage N1 3.2%, Stage N2 57.3%, Stage N3 39.5% and Stage R (REM sleep) 0%.   RESPIRATORY ANALYSIS:  There were a total of 10 respiratory events:  2 obstructive apneas, 8 hypopneas without respiratory event related arousals (RERAs).  The total APNEA/HYPOPNEA INDEX (AHI) was 9.7 /hour. 0 events occurred in REM sleep and 16 events in NREM. The REM AHI was 0.0 /hour versus a non-REM AHI of 9.7 /hour. The patient spent 0 minutes sleep  time in the supine position 137 minutes in non-supine. The supine AHI was 0.0 /hour versus a non-supine AHI of 9.6 /hour.  OXYGEN SATURATION & C02:  The wake baseline 02 saturation was 86%, with the lowest being 86%. Time spent below 89% saturation equaled 17 minutes.  PERIODIC LIMB MOVEMENTS: The patient had a total of 0 Periodic Limb Movements.  The Periodic Limb Movement (PLM) index was 0 /hour and the PLM Arousal index was 0 /hour. The arousals were noted as: 12 were spontaneous, 0 were associated with PLMs, and 1 were associated with respiratory events.  EKG was highly abnormal with chronic atrial fibrillation.  Sleep efficiency was low.   TITRATION  STUDY WITH CPAP RESULTS: BIPAP was initiated at 8/4cmH20 with heated humidity per AASM split night standards and pressure was advanced to 9/5 cmH20 because of hypopneas, apneas and desaturations.  At a BIPAP pressure of 9/5cmH20, there was a reduction of the AHI to 2.4 /hour.   Total recording time (TRT) was 267.5 minutes, with a total sleep time (TST) of 75 minutes. The patient's sleep latency was 51 minutes. REM latency was 0 minutes. The sleep efficiency was 28.0 %.    SLEEP ARCHITECTURE: Wake after sleep was 163.5 minutes, Stage N1 0 minutes, Stage N2 16 minutes, Stage N3 59 minutes and Stage R (REM sleep) 0 minutes. The percentages were: Stage N1 0%, Stage N2 21.3%, Stage N3 78.7% and Stage R (REM sleep) 0%.  The arousals were noted as: 5 were spontaneous, 0 were associated with PLMs, and 3 were associated with respiratory events.  RESPIRATORY ANALYSIS:  There were a total of 7 respiratory events: 0 obstructive apneas, 5 central apneas and 2 hypopneas. The total APNEA/HYPOPNEA INDEX (AHI) was 5.6 /hour and the total RESPIRATORY DISTURBANCE INDEX was 5.6 /hour.  0 events occurred in REM sleep and 7 events in NREM. The REM AHI was 0 /hour versus a non-REM AHI of 5.6 /hour. The patient spent 0% of total sleep time in the supine position. The supine AHI was 0.0 /hour, versus a non-supine AHI of 5.6/hour.  OXYGEN SATURATION & C02:  The wake baseline 02 saturation was 91%, with the lowest being 83%. Time spent below 89% saturation equaled 0 minutes.  PERIODIC LIMB MOVEMENTS:  The patient had a total of 0 Periodic Limb Movements.    POLYSOMNOGRAPHY IMPRESSION :   1. Complex Sleep Apnea (CSA) was not significantly improved by BiPAP .The oxygen level improved  2. Central Sleep Apnea   3. abnormal EKG 4. Fragmented sleep, with and without BiPAP.    RECOMMENDATIONS: Use of BIPAP is optional, it will not influence the cardiac arrhythmia once chronic. There was a marginal benefit with the use of CPAP,  especially for hypoxemia.   A follow up appointment will be scheduled in the Sleep Clinic at [facility].      I certify that I have reviewed the entire raw data recording prior to the issuance of this report in accordance with the Standards of Accreditation of the American Academy of Sleep Medicine (AASM)     [name], M.D.   08-05-2018  Diplomat, American Board of Psychiatry and Neurology  Diplomat, [place] of Sleep Medicine Medical Director, [place] [facility] at [facility]

## 2018-08-19 ENCOUNTER — Other Ambulatory Visit: Payer: Self-pay | Admitting: Neurology

## 2018-08-19 ENCOUNTER — Telehealth: Payer: Self-pay | Admitting: Neurology

## 2018-08-19 DIAGNOSIS — G4733 Obstructive sleep apnea (adult) (pediatric): Secondary | ICD-10-CM

## 2018-08-19 DIAGNOSIS — G4737 Central sleep apnea in conditions classified elsewhere: Secondary | ICD-10-CM

## 2018-08-19 DIAGNOSIS — G4730 Sleep apnea, unspecified: Secondary | ICD-10-CM

## 2018-08-19 DIAGNOSIS — I4891 Unspecified atrial fibrillation: Principal | ICD-10-CM

## 2018-08-19 NOTE — Telephone Encounter (Signed)
-----   Message from [name], MD sent at 08/15/2018  2:59 PM EST ----- She has now very mild complex apnea, , the Bipap pressure helped the oxygen nadir , but her sleep was very fragmented. I would leave it up to her, if she rather uses BiPAP, she should if not she can leave it.

## 2018-08-19 NOTE — Telephone Encounter (Signed)
Called the patient back and was able to review with her the sleep study results. Advised the patient that her sleep was fragemented that night and we werent able to have enough time with the Bipap setting to see if it would truly treat her apnea. After speaking with the sleep lab manager and in looking over the sleep study. It may be worth if her machine has the capability to change her pressure to auto BIPAP with IPAP max 12 cm water pressure and EPAP min pressure set at 5. If not auto capable then we will decrease the pressure  To 9/5 cm water pressure because that is where she was treated in the sleep lab. We will get a 30 day download and see if shows [name] how she is doing. Pt verbalized understanding. Patient had an apt scheduled for Monday at 2:30 pm. Advised the patient that Dr Brett Fairy will be out of the office on Monday. We will need to reschedule the patient. Advised that I will talk with Dr Brett Fairy about when we can see her back or I will call her if I have cancellation. Pt verbalized understanding.

## 2018-08-19 NOTE — Addendum Note (Signed)
Addended by: Darleen Crocker on: 08/19/2018 11:57 AM   Modules accepted: Orders

## 2018-08-19 NOTE — Telephone Encounter (Signed)
Called patient to discuss sleep study results. No answer at this time. LVM for the patient to call back.   

## 2018-08-19 NOTE — Telephone Encounter (Signed)
Pt has called RN Casey back, she is asking for a call back °

## 2018-08-26 ENCOUNTER — Ambulatory Visit: Payer: Self-pay | Admitting: Neurology

## 2018-08-27 ENCOUNTER — Telehealth: Payer: Self-pay | Admitting: Neurology

## 2018-08-27 ENCOUNTER — Encounter: Payer: Self-pay | Admitting: Neurology

## 2018-08-27 ENCOUNTER — Ambulatory Visit (INDEPENDENT_AMBULATORY_CARE_PROVIDER_SITE_OTHER): Payer: Medicare Other | Admitting: Neurology

## 2018-08-27 VITALS — BP 121/76 | HR 52 | Ht 63.0 in | Wt 197.0 lb

## 2018-08-27 DIAGNOSIS — E249 Cushing's syndrome, unspecified: Secondary | ICD-10-CM

## 2018-08-27 DIAGNOSIS — I4821 Permanent atrial fibrillation: Secondary | ICD-10-CM

## 2018-08-27 DIAGNOSIS — E240 Pituitary-dependent Cushing's disease: Secondary | ICD-10-CM

## 2018-08-27 DIAGNOSIS — E119 Type 2 diabetes mellitus without complications: Secondary | ICD-10-CM | POA: Insufficient documentation

## 2018-08-27 DIAGNOSIS — G4737 Central sleep apnea in conditions classified elsewhere: Secondary | ICD-10-CM | POA: Diagnosis not present

## 2018-08-27 DIAGNOSIS — G35 Multiple sclerosis: Secondary | ICD-10-CM | POA: Diagnosis not present

## 2018-08-27 DIAGNOSIS — I5032 Chronic diastolic (congestive) heart failure: Secondary | ICD-10-CM

## 2018-08-27 DIAGNOSIS — R150 Incomplete defecation: Secondary | ICD-10-CM

## 2018-08-27 DIAGNOSIS — Z6835 Body mass index (BMI) 35.0-35.9, adult: Secondary | ICD-10-CM | POA: Diagnosis not present

## 2018-08-27 DIAGNOSIS — I4891 Unspecified atrial fibrillation: Secondary | ICD-10-CM

## 2018-08-27 DIAGNOSIS — G4730 Sleep apnea, unspecified: Secondary | ICD-10-CM | POA: Diagnosis not present

## 2018-08-27 MED ORDER — BACLOFEN 20 MG PO TABS: ORAL_TABLET | ORAL | 0 refills | Status: DC

## 2018-08-27 MED ORDER — CORTICOTROPIN 80 UNIT/ML IJ GEL: INTRAMUSCULAR | 5 refills | Status: DC

## 2018-08-27 NOTE — Progress Notes (Signed)
PATIENT: Toni [name] DOB: 05/10/1946  REASON FOR VISIT: follow up MS, OSA, Spasms, / renal cell carcinoma - Dr. Ernst Bowler   HISTORY FROM: patient alone   HISTORY OF PRESENT ILLNESS:  Today 08/27/18, Rv: seeing Mrs. Agent after last Baclofen pump refill and after sleep study:  The patient endorsed the Epworth Sleepiness Scale at 12/24 points   The patient's weight 207 pounds with a height of 63 (inches), resulting in a BMI of 36.7 kg/m2. The patient's neck circumference measured 18 inches.  CURRENT MEDICATIONS: Xanax, Lioresal, Zyrtec, Cardizem, Flonase, Lasix, Neurontin, Synthroid, Bystolic, Prilosec, Zantac, Zocor, Flomax, Coumadin   PATIENT'S NAME:  Toni, [name] DOB:      Aug 20, 1946      MR#:    [id number]     DATE OF RECORDING: 08/12/2018 [facility] REFERRING M.D.:  Prince Solian MD Study Performed:  Split-Night Titration Study HISTORY:  Toni [name] is a 73 year old female MS patient and with complex sleep apnea on BIPAP   She today having lost again a significant amount of weight.  She follows Dr. Leafy Ro at the medical weight management clinic.  With her weight loss came also the needs to refit her CPAP mask.  She is using a BiPAP setting of 20/15 cmH2O and her residual AHI is 4.9/h the best for long time.  She does have minimal leaks which speaks for the good fit of her current mask.  She uses the machine 100% of the time of 7 hours and 2 minutes of average use.  I would not like to change any settings and also the patient reports that she has pressure marks and sometimes almost bruising from her facial mask seems to be the best she has done in a long time.  Her Epworth sleepiness score also has improved /she only endorsed 12 out of 24 points and she endorsed 20 out of 63 points of the fatigue severity score.  I see her as much less fatigued, and more optimistic.  Overall stronger -no longer with home health- now with "[facility]" 3 times a week. She now can stand long enough to pull up  her pants. Her last labs were reviewed. She is in chronic atrial fibrillation- rate controlled on diltiazem 180 mg, BP is lower.    Patient returning for a BIPAP Titration sleep study scored at 4%, with a 1 hour Baseline study at the beginning.  POLYSOMNOGRAPHY IMPRESSION :   1. Complex Sleep Apnea (CSA) was not significantly improved by BiPAP .The oxygen level improved  2. Central Sleep Apnea   3. abnormal EKG- chronic a fib.  4. Fragmented sleep, with and without BiPAP.  RECOMMENDATIONS: Use of BIPAP is optional, it will not influence the cardiac arrhythmia once chronic. There was a marginal benefit with the use of PAP, especially for the hypoxemia. No obstructive apnea of significance. She continues to use BipAP- she can lower the pressure.  [name], M.D.   08-05-2018  Medical Director, [facility] at [facility]   [name] reports that her heart rate has been slower and her blood pressure has been measurably lower, while she remains in chronic atrial fibrillation.  She is on a calcium channel blocker, and a beta-blocker.  She has used her BiPAP at the current settings of 20/15 centimeters since the results of the last sleep study did not yet lead to a result.  Problems.  She is currently using a pressure that I ordered at 9/5 cmH2O.  I also asked for  30-day download on those results for sleep study as quoted above and she has been showing interest in using the BiPAP just because it does not help her fatigue to have her oxygen levels elevated.  It has had a very marginal effect on the overall AHI.  Her AHI was 5.6/h and would not require necessarily positive airway therapy.   She endorsed today the fatigue severity score at only 19 points which is unusually low for her and her Epworth Sleepiness Scale at 7 points which is also lower than usual.   She has lost so much weight that she no longer applies to the risk of obesity hypoventilation and I congratulated her for her extraordinary efforts  given her limited exercise capacity.    History of present illness :Toni [name]  has been seen in this office for MS for 30 years, was diagnosed by Dr. Rosiland Oz in [phone]. Mr. Spiewak had several days of severe spasms, feeling tight, and she was unable to transfer, to walk with her walker. At baseline she can walk 40-50 feet. She was asked to used acthar 2 days in a row, and it seems to help, she has 6 more to go. She did not feel any improvement after taking baclofen.  She has been successful in losing a lot of weight under Dr. Migdalia Dk medical weight management program, at that clinic she needs to stay and stand still on a special scale which allows the differentiation between total weight, water weight fluid and fat content.  She is not able right now to stand still enough to allow this machine to work.  However her weight loss has been so significant that she has some loose skin now up to 18 her current weight is 218 pounds and this will certainly support her apnea treatment.  Orders the patient is here followed also for complex and central sleep apnea, the control had been important as she also developed atrial fibrillation followed by Dr. Debara Pickett.   12-19-2017, RV . Follow up visit with [name], better known as Toni [name], established 73 year old Caucasian female patient with long-standing history of MS.  She just celebrated her 31th wedding anniversary with her husband.  The patient has been followed for a prescription pump and fill by Dr. [name] she has an appointment on April 11 and at that time her baclofen rate was increased to 799.5 mcg/day, for a day if she felt floppy and weak.  The very next day the dose was reduced to 700.3 mcg a day and since then she has been doing okay.  She still uses actor, she still follows with Dr. Leafy Ro at the medical weight management clinic.  Dr. Debara Pickett is her cardiologist, Dr. Dagmar Hait her primary care physician. The patient most recently saw Dr. Leafy Ro she was unable  to step up to the scale and told safely on.  Dr. Leafy Ro obtain a metabolic panel which showed hypokalemia we will review this in detail later on today's visit, she has been started on potassium supplements.   Date , MM: Ms. Velasquez is a 73 year old female with a history of multiple sclerosis and obstructive sleep apnea on BiPAP.   she returns today for follow-up.  Her BiPAP indicates that she use her machine 30 out of 30 days for compliance of 100%.  She use her machine greater than 4 hours 28 out of 30 days for compliance of 93%.  On average she uses her machine 7 hours and 14 minutes.  Her IPAP  is 20 cm of water and EPAP pressure of 15 cm of water her residual AHI is 16.7.  She does not have a significant leak.  The patient reports that she has been seeing Dr. Leafy Ro for weight loss.  Reports that she is lost approximately 30 pounds.  She has an appointment this coming Wednesday.  The patient denies any new numbness or weakness does feel like her hands is getting gradually weaker.  She states that she does participate in physical therapy 1 time a week but then does her own exercises 3 times a week.  She uses a wheelchair primarily.  She reports that she has several more months of Betaseron left but once her prescription runs out she plans to stop this medication. She reports that taking Acthar works better for her. She reports that she has been on Copaxone and Avonex but never any of the oral medications. She returns today for an evaluation.  My long-standing patient Tawania Daponte has struggled trying to control her complex sleep apneas , which are central and obstructive in nature. She has started on a BiPAP setting of 20/15 cm water and has been 100% compliance for the last 30 days with an average of 7 hours and 42 minutes. The residual AHI is still 13.9 area advanced home care did call her and told her that there with 3 days consecutively during which her AHI was markedly reduced.This occurred during May,  and it related to Mrs. Hillery being given at double dose of diuretics , while she was at [facility] rehab. It was most nights after a high diuretic was given that she slept the best and happily with congestive central apnea. In the meantime, her cardiologist, Dr. Debara Pickett, has increased Lasix from 60-80 mg daily but she has not had the same effect as she had at 100 mg daily. She is continuously in a fib now. No ablation as long as apnea is not treated.   Dr. Leafy Ro did an extensive lab panel,  Good HB a1c. She was low on protein- and she sees a nutritionist. [name] is now cooking for her. Lots of chicken and vegetables- and she enjoys the food.  MS stable on Acthar. No diabetes on acthar and no relapses into acute a fib. Melatonin helps sleep. Given that Mrs. Denslow has suffered from [place] for almost 30 years now.     REVIEW OF SYSTEMS: Out of a complete 14 system review of symptoms, the patient complains only of the following symptoms, and all other reviewed systems are negative. Ankle and lag edema, Urinary urge. She uses a BiPAP - 20-15 cm water. She endorsed today the fatigue severity score at only 19 points which is unusually low for her and her Epworth Sleepiness Scale at 7 points which is also lower than usual.   She has lost so much weight that she no longer applies to the risk of obesity hypoventilation and I congratulated her for her extraordinary efforts given her limited exercise capacity. Hemorrhoids, stool elimination is incomplete. Stool is soft. Abdominal pain,vagal feeling - gastroparesis? .    ALLERGIES: Allergies  Allergen Reactions  . Hydrocodone Nausea And Vomiting    Hydrocodone causes vomiting  . Oxycodone Nausea And Vomiting    Oral oxycodone causes vomiting  . Chlorhexidine Gluconate Rash    Sores at site  . Zoloft [Sertraline] Hives, Swelling and Rash    HOME MEDICATIONS: Outpatient Medications Prior to Visit  Medication Sig Dispense Refill  . acetaminophen (TYLENOL) 500 MG  tablet Take 500-1,000 mg by mouth every 6 (six) hours as needed for fever or headache (or pain).    . ACTHAR 80 UNIT/ML injectable gel INJECT 80 UNITS (1ML)  SUBCUTANEOUSLY EVERY OTHER  DAY FOR A TOTAL OF 10 DAYS. REPEAT EVERY 30 DAYS.  (DISCARD 28 DAYS AFTER 1ST  USE) 5 mL 5  . ALPRAZolam (XANAX) 0.5 MG tablet Take 1 tablet (0.5 mg total) by mouth 2 (two) times daily as needed for anxiety. 60 tablet 0  . baclofen (LIORESAL) 20 MG tablet Once to two tablets a day for breakthrough spasms 180 tablet 0  . Biotin (PA BIOTIN) 1000 MCG tablet Take 1,000 mcg by mouth daily.    Marland Kitchen buPROPion (WELLBUTRIN XL) 300 MG 24 hr tablet Take 1 tablet (300 mg total) by mouth daily. 30 tablet 0  . CALCIUM CITRATE PO Take 2 tablets by mouth 2 (two) times daily.    . cetirizine (ZYRTEC) 10 MG tablet Take 10 mg by mouth daily.    . Cholecalciferol (VITAMIN D) 2000 units tablet Take 1 tablet (2,000 Units total) by mouth 2 (two) times daily.  0  . co-enzyme Q-10 30 MG capsule Take 30 mg by mouth daily.    . Cranberry 1000 MG CAPS Take by mouth.    . diltiazem (CARDIZEM CD) 180 MG 24 hr capsule TAKE 1 CAPSULE(180 MG) BY MOUTH DAILY 90 capsule 3  . docusate sodium (COLACE) 100 MG capsule Take 1 capsule (100 mg total) by mouth daily. 10 capsule 0  . famotidine (PEPCID) 20 MG tablet TK 1 T PO D  6  . fluticasone (FLONASE) 50 MCG/ACT nasal spray Place 2 sprays into both nostrils daily as needed for allergies.     . furosemide (LASIX) 40 MG tablet Take 80 mg by mouth daily.     Marland Kitchen gabapentin (NEURONTIN) 300 MG capsule TAKE 1 CAPSULE BY MOUTH UP  TO 4 TIMES DAILY 360 capsule 3  . lactulose, encephalopathy, (GENERLAC) 10 GM/15ML SOLN TAKE 15 MLS BY MOUTH TWICE DAILY AS NEEDED 946 mL 3  . levothyroxine (SYNTHROID, LEVOTHROID) 75 MCG tablet Take 75 mcg by mouth daily.    . Multiple Vitamins-Minerals (CENTRUM SILVER PO) Take 1 tablet by mouth daily.    . nebivolol (BYSTOLIC) 10 MG tablet Take 20 mg by mouth daily.     . Omega-3 Fatty  Acids (FISH OIL) 1000 MG CAPS Take 1,000 mg by mouth daily.    Marland Kitchen omeprazole (PRILOSEC) 20 MG capsule Take 20 mg by mouth daily.     . psyllium (METAMUCIL SMOOTH TEXTURE) 28 % packet Take 1 packet by mouth every other day.    . simvastatin (ZOCOR) 10 MG tablet     . SYRINGE/NEEDLE, DISP, 1 ML (BD LUER-LOK SYRINGE) 25G X 5/8" 1 ML MISC Use as directed to inject  Acthar 10 each prn  . Tamsulosin HCl (FLOMAX) 0.4 MG CAPS Take 0.4 mg by mouth daily.    Marland Kitchen UNABLE TO FIND Baclofen pump: Continuous    . warfarin (COUMADIN) 5 MG tablet Take 1 tablet by mouth daily as directed by coumadin clinic. 90 tablet 1  . cephALEXin (KEFLEX) 500 MG capsule Take 500 mg by mouth 3 (three) times daily.    Marland Kitchen GENERLAC 10 GM/15ML SOLN TAKE 15 MLS BY MOUTH TWICE DAILY AS NEEDED 946 mL 0  . ranitidine (ZANTAC) 150 MG tablet Take 150 mg by mouth at bedtime.      Facility-Administered Medications Prior to Visit  Medication Dose Route  Frequency Provider Last Rate Last Dose  . baclofen (LIORESAL) intrathecal injection 40 mg/22mL  40 mg Intrathecal Once Kathrynn Ducking, MD        PAST MEDICAL HISTORY: Past Medical History:  Diagnosis Date  . Abnormality of gait 03/01/2015  . Anxiety   . Arthritis    "knees" (01/19/2016)  . Atrial fibrillation with RVR ([place]) 01/19/2016  . Back pain   . Chest pain   . Childhood asthma   . Chronic pain    "nerve pain from the MS"  . Constipation   . Depression   . DVT (deep venous thrombosis) ([place]) 1983   "RLE; may have just been phlebitis; it was before the age of dopplers"  . 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 GERD (gastroesophageal reflux disease)   . History of stress test 06/21/10   limited exam with some degree of breast attenuatin of the apex however a component of apical ischemia is not excluded, EF 62%; cardiac cath   . HLD (hyperlipidemia)   . Hx of echocardiogram 04/22/10   EF 55-60%, no valve issues  .  Hypertension   . Hypothyroidism   . Joint pain   . Migraine    "none since I stopped beta blocker in 1990s" (01/19/2016)  . Multiple sclerosis ([place])    has had this may 1989-[place] reg doc  . OSA on CPAP   . Osteoarthritis   . PAF (paroxysmal atrial fibrillation) ([place])    on coumadin; documented on monitor 06/2010  . Pneumonia 11/2011  . Shortness of breath   . Thyroid disease     PAST SURGICAL HISTORY: Past Surgical History:  Procedure Laterality Date  . ANTERIOR CERVICAL DECOMP/DISCECTOMY FUSION  01/2004  . BACK SURGERY    . CARDIAC CATHETERIZATION  06/22/2010   normal coronary arteries, PAF  . CATARACT EXTRACTION, BILATERAL Bilateral   . CHILD BIRTHS     X2  . DILATION AND CURETTAGE OF UTERUS    . INFUSION PUMP IMPLANTATION  ~ 2009   baclofen infusion in lower abd  . PAIN PUMP REVISION N/A 07/29/2014   Procedure: Baclofen pump replacement;  Surgeon: Erline Levine, MD;  Location: [facility];  Service: Neurosurgery;  Laterality: N/A;  Baclofen pump replacement  . TONSILLECTOMY AND ADENOIDECTOMY    . VARICOSE VEIN SURGERY  ~ 1968  . VENOUS ABLATION  12/16/2010   radiofreq ablation -Dr Elisabeth Cara and [facility]  . WISDOM TOOTH EXTRACTION      FAMILY HISTORY: Family History  Problem Relation Age of Onset  . Coronary artery disease Father        at age 42  . Hyperlipidemia Father   . Thyroid disease Father   . Coronary artery disease Maternal Grandmother   . Depression Maternal Grandmother   . Cancer Paternal Grandmother   . Depression Mother   . Sudden death Mother   . Anxiety disorder Mother   . Alcoholism Son     SOCIAL HISTORY: Social History   Socioeconomic History  . Marital status: Married    Spouse name: [name]  . Number of children: 2  . Years of education: COLLEGE  . Highest education level: Not on file  Occupational History  . Occupation: RETIRED  Social Needs  . Financial resource strain: Not on file  . Food insecurity:    Worry: Not on file     Inability: Not on file  .  Transportation needs:    Medical: Not on file    Non-medical: Not on file  Tobacco Use  . Smoking status: Former Smoker    Packs/day: 0.50    Years: 7.00    Pack years: 3.50    Types: Cigarettes    Last attempt to quit: 08/21/1976    Years since quitting: 42.0  . Smokeless tobacco: Never Used  Substance and Sexual Activity  . Alcohol use: Yes    Alcohol/week: 1.0 standard drinks    Types: 1 Glasses of wine per week  . Drug use: No  . Sexual activity: Not Currently  Lifestyle  . Physical activity:    Days per week: Not on file    Minutes per session: Not on file  . Stress: Not on file  Relationships  . Social connections:    Talks on phone: Not on file    Gets together: Not on file    Attends religious service: Not on file    Active member of club or organization: Not on file    Attends meetings of clubs or organizations: Not on file    Relationship status: Not on file  . Intimate partner violence:    Fear of current or ex partner: Not on file    Emotionally abused: Not on file    Physically abused: Not on file    Forced sexual activity: Not on file  Other Topics Concern  . Not on file  Social History Narrative   Lives in [place] with Husband [name] [phone], [phone]   Currently in PT - [facility] , [place] [address]     PHYSICAL EXAM  Vitals:   08/27/18 1100  BP: 121/76  Pulse: (!) 52  Weight: 197 lb (89.4 kg)  Height: 5\' 3"  (1.6 m)   Body mass index is 34.9 kg/m.  Generalized:  in no acute distress, wheelchair    irregular heart beat, atrial  fibrillation.  Wheelchair bound but able to transfer. Excess skin after weight loss, chin and upper arms- bilateral edema up to the knees. Broken blood vessels.  Lungs are clear to auscultation.   Neurological examination :  Mentation: Alert oriented to time, place, history taking. Follows all commands speech and language fluent, good memory. She has noted word finding slowing, and  slurring. Has to deliberately speak slow.  Cranial nerve : Pupils were equal round reactive to light.  Extraocular movements were full, visual field were full on confrontational test. Facial sensation and strength were normal. Tongue in midline, bo fasciculations. .  Head turning and shoulder shrug  were normal and symmetric. Motor: The patient cannot fully extend her right upper extremity at the elbow, but she has good biceps flexion strength. Grip strength is weak and bilaterally there is bilaterally significant atrophy of the thenar eminence noted.  She has not just flattening of the eminence, but a groove,   SENSORY_ numbness, unable to do crossword puzzles and write letters. Her fingers feel stiff. She noted an abdominal and chest spasm, she is unable right now to walk has lower back pain and feels generalized weak and heavy. After has given her some energy back.   As to sensory she noticed ongoing numbness in the fingers of both hands the pounds but no numbness in the legs.  Loss of sensation at the feet and ankle was related to the significant edema.  We did ambulate today with a walker and watched her transfers- and we  did upper extremities exercises.  DIAGNOSTIC DATA (LABS, IMAGING, TESTING) - I reviewed patient records, labs, notes, testing and imaging myself where available.  Lab Results  Component Value Date   WBC 8.9 07/11/2018   HGB 14.6 07/11/2018   HCT 45 07/11/2018   MCV 93.2 07/08/2017   PLT 185 10/01/2017      Component Value Date/Time   NA 119 (A) 07/11/2018   K 4.1 07/11/2018   CL 95 (L) 04/23/2018 1159   CO2 30 (H) 04/23/2018 1159   GLUCOSE 45 (L) 04/23/2018 1159   GLUCOSE 92 07/08/2017 1214   BUN 26 04/23/2018 1159   CREATININE 0.6 07/11/2018   CREATININE 0.69 04/23/2018 1159   CALCIUM 9.5 04/23/2018 1159   PROT 6.0 04/23/2018 1159   ALBUMIN 4.0 04/23/2018 1159   AST 19 07/11/2018   ALT 33 07/11/2018   ALKPHOS 55 07/11/2018   BILITOT 0.3 04/23/2018  1159   GFRNONAA 88 04/23/2018 1159   GFRAA 102 04/23/2018 1159   Lab Results  Component Value Date   CHOL 147 07/11/2018   HDL 53 07/11/2018   LDLCALC 83 07/11/2018   TRIG 56 07/11/2018   CHOLHDL 3.0 04/22/2010   Lab Results  Component Value Date   HGBA1C 5.3 04/23/2018   No results found for: [id number] Lab Results  Component Value Date   TSH 0.651 11/27/2017      ASSESSMENT AND PLAN 73 y.o. year old female  has a past medical history of Abnormality of gait (03/01/2015), Anxiety, Arthritis, Atrial fibrillation with RVR ([place]) (01/19/2016), Back pain, Chest pain, Childhood asthma, Chronic pain, Constipation, Depression, DVT (deep venous thrombosis) ([place]) (1983), Edema, GERD (gastroesophageal reflux disease), History of stress test (06/21/10), HLD (hyperlipidemia), echocardiogram (04/22/10), Hypertension, Hypothyroidism, Joint pain, Migraine, Multiple sclerosis ([place]), OSA on CPAP, Osteoarthritis, PAF (paroxysmal atrial fibrillation) ([place]), Pneumonia (11/2011), Shortness of breath, and Thyroid disease. here with:  1.  MILD COMPLEX sleep apnea on BiPAP-no longer enough central apneas to cause her atrial fib, but we were unable to control her apnea and she is therefor not a cardioversion candidate.  BiPAP to 9/ 5 cm water - Mrs. Vandenboom has been extremely compliant has used at 100% of the last 90 days was 100% compliance by time, .  2.  Multiple sclerosis on Acthar, which she hs sparingly used over the last 6 month, has had gastroparesis , nausea, flushing.  PT helped with gait problems, sensory abnormalities and " MS hugs" .  3. Weight loss- successfully with Dr Leafy Ro. she has less need for BiPAP , but has hypoxemia. She is going to use it. 9/5 cm water.   4. Atrial fibrillation,chronic and persistent.   She will continue on BiPAP therapy 9/ 5 cm now.  The patient wants to continue on Betaseron- and she will remain on Acthar.  Dr [name] will try an increase of baclofen application by  pump. As described above, she is now on 700 mcg a day.   Renal cell carcinoma - Dr. Tresa Moore- [facility] .    She is advised that if her symptoms worsen or she develops new symptoms she should let us know.  She is very much better- more strength, optimism and no falls.the weight loss has much improved her central and obstructive apneas.       She will follow-up in May- June  2020 , q 6 month  with me, Dr. Brett Fairy and alternating with NP.  70 pounds weight loss !!    Visit was 25 minutes long and addressed 3 problems.  BiPAP adjustment, gait and stool incontinence, and  carpal tunnel.  Walked with walker.     [name], MD  08/27/2018, 11:42 AM [facility] [address], [place], [place] [address] [phone]

## 2018-08-27 NOTE — Telephone Encounter (Signed)
I have sent referral urgent to Dr. Collene Mares Telephone [phone] - fax [phone]. Patient is aware I have talked to her.

## 2018-08-29 ENCOUNTER — Other Ambulatory Visit: Payer: Self-pay | Admitting: Neurology

## 2018-08-29 ENCOUNTER — Telehealth: Payer: Self-pay | Admitting: Neurology

## 2018-08-29 ENCOUNTER — Encounter (INDEPENDENT_AMBULATORY_CARE_PROVIDER_SITE_OTHER): Payer: Self-pay | Admitting: Family Medicine

## 2018-08-29 DIAGNOSIS — G35 Multiple sclerosis: Secondary | ICD-10-CM

## 2018-08-29 DIAGNOSIS — M62838 Other muscle spasm: Secondary | ICD-10-CM

## 2018-08-29 NOTE — Telephone Encounter (Signed)
Order written with patients request and sent to our referral dept with this information.

## 2018-08-29 NOTE — Telephone Encounter (Signed)
Pt has called asking for a Dr Brett Fairy to write a prescription for physical Therapy to [facility], their [phone]

## 2018-09-02 ENCOUNTER — Ambulatory Visit (INDEPENDENT_AMBULATORY_CARE_PROVIDER_SITE_OTHER): Payer: Medicare Other | Admitting: Pharmacist

## 2018-09-02 DIAGNOSIS — I4821 Permanent atrial fibrillation: Secondary | ICD-10-CM | POA: Diagnosis not present

## 2018-09-02 DIAGNOSIS — Z7901 Long term (current) use of anticoagulants: Secondary | ICD-10-CM

## 2018-09-02 LAB — POCT INR: INR: 2 (ref 2.0–3.0)

## 2018-09-04 ENCOUNTER — Encounter: Payer: Self-pay | Admitting: Neurology

## 2018-09-04 ENCOUNTER — Ambulatory Visit (INDEPENDENT_AMBULATORY_CARE_PROVIDER_SITE_OTHER): Payer: Medicare Other | Admitting: Neurology

## 2018-09-04 VITALS — BP 107/63 | HR 81

## 2018-09-04 DIAGNOSIS — G35 Multiple sclerosis: Secondary | ICD-10-CM | POA: Diagnosis not present

## 2018-09-04 MED ORDER — BACLOFEN 40 MG/20ML IT SOLN
80.0000 mg | Freq: Once | INTRATHECAL | Status: AC
  Administered 2018-09-04: 80 mg via INTRATHECAL

## 2018-09-04 NOTE — Procedures (Signed)
     History:  Toni [name] is a 73 year old patient with a history of chronic progressive multiple sclerosis.  The patient has a baclofen pump in place, she comes in for a baclofen pump refill.  She has noted some slight increase in her spasticity since last seen, plans are to go up on the dosing of the baclofen.  Baclofen pump refill note  The baclofen pump site was cleaned with Betadine solution. A 21-gauge needle was inserted into the pump port site. Approximately 7 cc of residual baclofen was removed, the pump indicates a 2.6 cc residual. 40 cc of replacement baclofen was placed into the pump at 2000 mcg/cc concentration.  The pump was reprogrammed for the following settings: The pump was increased from dose of 700.3 mcg/day to a dose of 728.7 mcg/day.  The alarm volume is set at 1.5 cc. The next alarm date is December 18, 2018.  The patient tolerated the procedure well. There were no complications of the above procedure.  There is a discrepancy between the amount of baclofen pulled off the pump and what the pump is indicating, this will need to be followed, this may indicate an evolving pump failure.  The [place] number is [phone] The baclofen expiration date is January 2022. The baclofen lot number is [id number].

## 2018-09-04 NOTE — Progress Notes (Signed)
Please refer to baclofen pump procedure note. 

## 2018-09-05 ENCOUNTER — Ambulatory Visit: Payer: Medicare Other | Admitting: Gastroenterology

## 2018-09-09 ENCOUNTER — Encounter (INDEPENDENT_AMBULATORY_CARE_PROVIDER_SITE_OTHER): Payer: Self-pay

## 2018-09-09 ENCOUNTER — Ambulatory Visit (INDEPENDENT_AMBULATORY_CARE_PROVIDER_SITE_OTHER): Payer: Self-pay | Admitting: Family Medicine

## 2018-09-09 DIAGNOSIS — M6281 Muscle weakness (generalized): Secondary | ICD-10-CM | POA: Diagnosis not present

## 2018-09-09 DIAGNOSIS — M25662 Stiffness of left knee, not elsewhere classified: Secondary | ICD-10-CM | POA: Diagnosis not present

## 2018-09-09 DIAGNOSIS — M25661 Stiffness of right knee, not elsewhere classified: Secondary | ICD-10-CM | POA: Diagnosis not present

## 2018-09-09 DIAGNOSIS — M2560 Stiffness of unspecified joint, not elsewhere classified: Secondary | ICD-10-CM | POA: Diagnosis not present

## 2018-09-11 ENCOUNTER — Other Ambulatory Visit (INDEPENDENT_AMBULATORY_CARE_PROVIDER_SITE_OTHER): Payer: Self-pay | Admitting: Family Medicine

## 2018-09-11 ENCOUNTER — Telehealth: Payer: Self-pay | Admitting: Neurology

## 2018-09-11 ENCOUNTER — Ambulatory Visit (INDEPENDENT_AMBULATORY_CARE_PROVIDER_SITE_OTHER): Payer: Medicare Other | Admitting: Family Medicine

## 2018-09-11 VITALS — BP 123/67 | HR 89 | Temp 98.1°F | Ht 63.0 in | Wt 200.0 lb

## 2018-09-11 DIAGNOSIS — Z6835 Body mass index (BMI) 35.0-35.9, adult: Secondary | ICD-10-CM | POA: Diagnosis not present

## 2018-09-11 DIAGNOSIS — R0602 Shortness of breath: Secondary | ICD-10-CM | POA: Diagnosis not present

## 2018-09-11 DIAGNOSIS — E8881 Metabolic syndrome: Secondary | ICD-10-CM | POA: Diagnosis not present

## 2018-09-11 DIAGNOSIS — E559 Vitamin D deficiency, unspecified: Secondary | ICD-10-CM

## 2018-09-11 DIAGNOSIS — R739 Hyperglycemia, unspecified: Secondary | ICD-10-CM | POA: Diagnosis not present

## 2018-09-11 NOTE — Telephone Encounter (Signed)
Pt states her insurance company has denied her request for a refill on her corticotropin (ACTHAR) 80 UNIT/ML injectable gel, pt states generally Dr Brett Fairy is able to get this corrected for her.  Please contact*pt states no changes to insurance for 2020*

## 2018-09-12 DIAGNOSIS — M25661 Stiffness of right knee, not elsewhere classified: Secondary | ICD-10-CM | POA: Diagnosis not present

## 2018-09-12 DIAGNOSIS — M6281 Muscle weakness (generalized): Secondary | ICD-10-CM | POA: Diagnosis not present

## 2018-09-12 DIAGNOSIS — M25662 Stiffness of left knee, not elsewhere classified: Secondary | ICD-10-CM | POA: Diagnosis not present

## 2018-09-12 DIAGNOSIS — M2560 Stiffness of unspecified joint, not elsewhere classified: Secondary | ICD-10-CM | POA: Diagnosis not present

## 2018-09-12 LAB — HEMOGLOBIN A1C
ESTIMATED AVERAGE GLUCOSE: 105 mg/dL
Hgb A1c MFr Bld: 5.3 % (ref 4.8–5.6)

## 2018-09-12 NOTE — Progress Notes (Signed)
Office: [phone]  /  Fax: [phone]   HPI:   Chief Complaint: OBESITY Toni [name] is here to discuss her progress with her obesity treatment plan. She is on the Category 2 plan and is following her eating plan approximately 90 % of the time. She states she is doing physical therapy and home exercise 20 to 60 minutes 2 to 5 times per week. Toni [name] has tried hard to follow her Category 2 plan, but had some holiday eating. She had her RMR tested again and it has fallen down to 1156 which explains why her weight loss has stopped.  Her weight is 200 lb (90.7 kg) today and has had a weight gain of 2 pounds over a period of 5 weeks since her last visit. She has lost 57 lbs since starting treatment with [name].  Insulin Resistance Toni [name] has a diagnosis of insulin resistance based on her elevated fasting insulin level >5. Although Toni [name]'s blood glucose readings are still under good control, insulin resistance puts her at greater risk of metabolic syndrome and diabetes. She is doing well on her diet prescription. Toni [name] notes some polyphagia in the afternoon, but doesn't want to start metformin. She continues to work on diet and exercise to decrease risk of diabetes.  Dyspnea with Exercise Toni [name] has multiple sclerosis and is in a wheelchair. She notes increasing shortness of breath with exercising and seems to be worsening over time with weight gain. She notes getting out of breath sooner with activity than she used to. This has not gotten worse recently.  Vitamin D deficiency Toni [name] has a diagnosis of vitamin D deficiency. She is currently taking vit D 2,000 IU and her last level was at goal. Her fatigue has improved overall and she denies nausea, vomiting, or muscle weakness.  ASSESSMENT AND PLAN:  Insulin resistance - Plan: Comprehensive metabolic panel, Hemoglobin A1c, Insulin, random  Shortness of breath on exertion  Vitamin D deficiency - Plan: VITAMIN D 25 Hydroxy  (Vit-D Deficiency, Fractures)  Class 2 severe obesity with serious comorbidity and body mass index (BMI) of 35.0 to 35.9 in adult, unspecified obesity type ([place])  PLAN:  Insulin Resistance Toni [name] will continue to work on weight loss, exercise, and decreasing simple carbohydrates in her diet to help decrease the risk of diabetes. We discussed metformin including benefits and risks. She was informed that eating too many simple carbohydrates or too many calories at one sitting increases the likelihood of GI side effects. Toni [name] does not want to start metformin for now and prescription was not written today. Labs were ordered today and she agrees to continue with diet and exercise. Toni [name] agreed to follow up with [name] as directed to monitor her progress in 3 to 4 weeks.  Dyspnea with exercise Toni [name]'s shortness of breath appears to be obesity related and exercise induced. The repeat indirect calorimeter results showed VO2 of 166 and her RMR has decreased to 1156. She was encouraged to build up exercise tolerance and especially concentrating on strength exercises. She has agreed to work on weight loss and gradually increase exercise to treat her exercise induced shortness of breath. If Toni [name] follows our instructions and loses weight without improvement of her shortness of breath, we will plan to refer to pulmonology. Toni [name] agrees to this plan.  Vitamin D Deficiency Toni [name] was informed that low vitamin D levels contributes to fatigue and are associated with obesity, breast, and colon cancer. We will order labs today and she agrees to follow up as directed.  Obesity  Toni [name] is currently in the action stage of change. As such, her goal is to continue with weight loss efforts. She has agreed to change to follow the Category 1 plan + 100 calorie snacks on the days when she exercises. Toni [name] has been instructed to work up to a goal of 150 minutes of combined cardio and strengthening  exercise per week for weight loss and overall health benefits. We discussed the following Behavioral Modification Strategies today: increasing lean protein intake and work on meal planning and easy cooking plans.  Toni [name] has agreed to follow up with our clinic in 3 to 4 weeks. She was informed of the importance of frequent follow up visits to maximize her success with intensive lifestyle modifications for her multiple health conditions.  ALLERGIES: Allergies  Allergen Reactions  . Hydrocodone Nausea And Vomiting    Hydrocodone causes vomiting  . Oxycodone Nausea And Vomiting    Oral oxycodone causes vomiting  . Chlorhexidine Gluconate Rash    Sores at site  . Zoloft [Sertraline] Hives, Swelling and Rash    MEDICATIONS: Current Outpatient Medications on File Prior to Visit  Medication Sig Dispense Refill  . acetaminophen (TYLENOL) 500 MG tablet Take 500-1,000 mg by mouth every 6 (six) hours as needed for fever or headache (or pain).    Marland Kitchen ALPRAZolam (XANAX) 0.5 MG tablet Take 1 tablet (0.5 mg total) by mouth 2 (two) times daily as needed for anxiety. 60 tablet 0  . baclofen (LIORESAL) 20 MG tablet Once to two tablets a day for breakthrough spasms 180 tablet 0  . Biotin (PA BIOTIN) 1000 MCG tablet Take 1,000 mcg by mouth daily.    Marland Kitchen buPROPion (WELLBUTRIN XL) 300 MG 24 hr tablet Take 1 tablet (300 mg total) by mouth daily. 30 tablet 0  . CALCIUM CITRATE PO Take 2 tablets by mouth 2 (two) times daily.    . cetirizine (ZYRTEC) 10 MG tablet Take 10 mg by mouth daily.    . Cholecalciferol (VITAMIN D) 2000 units tablet Take 1 tablet (2,000 Units total) by mouth 2 (two) times daily.  0  . co-enzyme Q-10 30 MG capsule Take 30 mg by mouth daily.    . corticotropin (ACTHAR) 80 UNIT/ML injectable gel INJECT 80 UNITS (1ML)  SUBCUTANEOUSLY EVERY OTHER  DAY FOR A TOTAL OF 10 DAYS. REPEAT EVERY 30 DAYS.  (DISCARD 28 DAYS AFTER 1ST  USE) 5 mL 5  . Cranberry 1000 MG CAPS Take by mouth.    . diltiazem  (CARDIZEM CD) 180 MG 24 hr capsule TAKE 1 CAPSULE(180 MG) BY MOUTH DAILY 90 capsule 3  . docusate sodium (COLACE) 100 MG capsule Take 1 capsule (100 mg total) by mouth daily. 10 capsule 0  . famotidine (PEPCID) 20 MG tablet TK 1 T PO D  6  . fluticasone (FLONASE) 50 MCG/ACT nasal spray Place 2 sprays into both nostrils daily as needed for allergies.     . furosemide (LASIX) 40 MG tablet Take 80 mg by mouth daily.     Marland Kitchen gabapentin (NEURONTIN) 300 MG capsule TAKE 1 CAPSULE BY MOUTH UP  TO 4 TIMES DAILY 360 capsule 3  . lactulose, encephalopathy, (GENERLAC) 10 GM/15ML SOLN TAKE 15 MLS BY MOUTH TWICE DAILY AS NEEDED 946 mL 3  . levothyroxine (SYNTHROID, LEVOTHROID) 75 MCG tablet Take 75 mcg by mouth daily.    . Multiple Vitamins-Minerals (CENTRUM SILVER PO) Take 1 tablet by mouth daily.    . nebivolol (BYSTOLIC) 10 MG tablet Take 20 mg by  mouth daily.     . Omega-3 Fatty Acids (FISH OIL) 1000 MG CAPS Take 1,000 mg by mouth daily.    Marland Kitchen omeprazole (PRILOSEC) 20 MG capsule Take 20 mg by mouth daily.     . psyllium (METAMUCIL SMOOTH TEXTURE) 28 % packet Take 1 packet by mouth every other day.    . simvastatin (ZOCOR) 10 MG tablet     . SYRINGE/NEEDLE, DISP, 1 ML (BD LUER-LOK SYRINGE) 25G X 5/8" 1 ML MISC Use as directed to inject  Acthar 10 each prn  . Tamsulosin HCl (FLOMAX) 0.4 MG CAPS Take 0.4 mg by mouth daily.    Marland Kitchen UNABLE TO FIND Baclofen pump: Continuous    . warfarin (COUMADIN) 5 MG tablet Take 1 tablet by mouth daily as directed by coumadin clinic. 90 tablet 1   Current Facility-Administered Medications on File Prior to Visit  Medication Dose Route Frequency Provider Last Rate Last Dose  . baclofen (LIORESAL) intrathecal injection 40 mg/62mL  40 mg Intrathecal Once Kathrynn Ducking, MD        PAST MEDICAL HISTORY: Past Medical History:  Diagnosis Date  . Abnormality of gait 03/01/2015  . Anxiety   . Arthritis    "knees" (01/19/2016)  . Atrial fibrillation with RVR ([place]) 01/19/2016  .  Back pain   . Chest pain   . Childhood asthma   . Chronic pain    "nerve pain from the MS"  . Constipation   . Depression   . DVT (deep venous thrombosis) ([place]) 1983   "RLE; may have just been phlebitis; it was before the age of dopplers"  . 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 GERD (gastroesophageal reflux disease)   . History of stress test 06/21/10   limited exam with some degree of breast attenuatin of the apex however a component of apical ischemia is not excluded, EF 62%; cardiac cath   . HLD (hyperlipidemia)   . Hx of echocardiogram 04/22/10   EF 55-60%, no valve issues  . Hypertension   . Hypothyroidism   . Joint pain   . Migraine    "none since I stopped beta blocker in 1990s" (01/19/2016)  . Multiple sclerosis ([place])    has had this may 1989-[facility] reg doc  . OSA on CPAP   . Osteoarthritis   . PAF (paroxysmal atrial fibrillation) ([place])    on coumadin; documented on monitor 06/2010  . Pneumonia 11/2011  . Shortness of breath   . Thyroid disease     PAST SURGICAL HISTORY: Past Surgical History:  Procedure Laterality Date  . ANTERIOR CERVICAL DECOMP/DISCECTOMY FUSION  01/2004  . BACK SURGERY    . CARDIAC CATHETERIZATION  06/22/2010   normal coronary arteries, PAF  . CATARACT EXTRACTION, BILATERAL Bilateral   . CHILD BIRTHS     X2  . DILATION AND CURETTAGE OF UTERUS    . INFUSION PUMP IMPLANTATION  ~ 2009   baclofen infusion in lower abd  . PAIN PUMP REVISION N/A 07/29/2014   Procedure: Baclofen pump replacement;  Surgeon: Erline Levine, MD;  Location: [facility];  Service: Neurosurgery;  Laterality: N/A;  Baclofen pump replacement  . TONSILLECTOMY AND ADENOIDECTOMY    . VARICOSE VEIN SURGERY  ~ 1968  . VENOUS ABLATION  12/16/2010   radiofreq ablation -Dr Elisabeth Cara and [facility]  . WISDOM TOOTH EXTRACTION      SOCIAL HISTORY: Social History  Tobacco Use  . Smoking status: Former Smoker    Packs/day: 0.50     Years: 7.00    Pack years: 3.50    Types: Cigarettes    Last attempt to quit: 08/21/1976    Years since quitting: 42.0  . Smokeless tobacco: Never Used  Substance Use Topics  . Alcohol use: Yes    Alcohol/week: 1.0 standard drinks    Types: 1 Glasses of wine per week  . Drug use: No    FAMILY HISTORY: Family History  Problem Relation Age of Onset  . Coronary artery disease Father        at age 83  . Hyperlipidemia Father   . Thyroid disease Father   . Coronary artery disease Maternal Grandmother   . Depression Maternal Grandmother   . Cancer Paternal Grandmother   . Depression Mother   . Sudden death Mother   . Anxiety disorder Mother   . Alcoholism Son    ROS: Review of Systems  Constitutional: Positive for malaise/fatigue. Negative for weight loss.  Respiratory: Positive for shortness of breath ( with exercise).   Gastrointestinal: Negative for nausea and vomiting.  Musculoskeletal:       Negative for muscle weakness.  Endo/Heme/Allergies:       Positive for polyphagia.   PHYSICAL EXAM: Blood pressure 123/67, pulse 89, temperature 98.1 F (36.7 C), temperature source Oral, height 5\' 3"  (1.6 m), weight 200 lb (90.7 kg), SpO2 100 %. Body mass index is 35.43 kg/m. Physical Exam Vitals signs reviewed.  Constitutional:      Appearance: Normal appearance. She is obese.  Cardiovascular:     Rate and Rhythm: Normal rate.  Pulmonary:     Effort: Pulmonary effort is normal.  Musculoskeletal: Normal range of motion.  Skin:    General: Skin is warm and dry.  Neurological:     Mental Status: She is alert and oriented to person, place, and time.  Psychiatric:        Mood and Affect: Mood normal.        Behavior: Behavior normal.    RECENT LABS AND TESTS: BMET    Component Value Date/Time   NA 119 (A) 07/11/2018   K 4.1 07/11/2018   CL 95 (L) 04/23/2018 1159   CO2 30 (H) 04/23/2018 1159   GLUCOSE 45 (L) 04/23/2018 1159   GLUCOSE 92 07/08/2017 1214   BUN 26  04/23/2018 1159   CREATININE 0.6 07/11/2018   CREATININE 0.69 04/23/2018 1159   CALCIUM 9.5 04/23/2018 1159   GFRNONAA 88 04/23/2018 1159   GFRAA 102 04/23/2018 1159   Lab Results  Component Value Date   HGBA1C 5.3 09/11/2018   HGBA1C 5.3 04/23/2018   HGBA1C 5.2 10/01/2017   HGBA1C 5.4 03/08/2017   HGBA1C  04/21/2010    5.5 (NOTE)                                                                       According to the ADA Clinical Practice Recommendations for 2011, when HbA1c is used as a screening test:   >=6.5%   Diagnostic of Diabetes Mellitus           (if abnormal result  is confirmed)  5.7-6.4%   Increased risk of  developing Diabetes Mellitus  References:Diagnosis and Classification of Diabetes Mellitus,Diabetes MHDQ,2229,79(GXQJJ 1):S62-S69 and Standards of Medical Care in         Diabetes - 2011,Diabetes HERD,4081,44  (Suppl 1):S11-S61.   Lab Results  Component Value Date   INSULIN 7.8 04/23/2018   INSULIN 20.4 11/27/2017   INSULIN 20.4 03/08/2017   CBC    Component Value Date/Time   WBC 8.9 07/11/2018   WBC 8.4 07/08/2017 1214   RBC 4.83 07/08/2017 1214   HGB 14.6 07/11/2018   HGB 14.1 03/08/2017 1152   HCT 45 07/11/2018   HCT 43.0 03/08/2017 1152   PLT 185 10/01/2017   PLT 219 12/18/2014 1157   MCV 93.2 07/08/2017 1214   MCV 93 03/08/2017 1152   MCH 31.5 07/08/2017 1214   MCHC 33.8 07/08/2017 1214   RDW 13.3 07/08/2017 1214   RDW 13.3 03/08/2017 1152   LYMPHSABS 1.6 07/08/2017 1214   LYMPHSABS 2.2 03/08/2017 1152   MONOABS 1.3 (H) 07/08/2017 1214   EOSABS 0.2 07/08/2017 1214   EOSABS 0.1 03/08/2017 1152   BASOSABS 0.0 07/08/2017 1214   BASOSABS 0.0 03/08/2017 1152   Iron/TIBC/Ferritin/ %Sat No results found for: IRON, TIBC, FERRITIN, IRONPCTSAT Lipid Panel     Component Value Date/Time   CHOL 147 07/11/2018   CHOL 129 11/27/2017 1011   TRIG 56 07/11/2018   HDL 53 07/11/2018   HDL 51 11/27/2017 1011   CHOLHDL 3.0 04/22/2010 0536   VLDL 29  04/22/2010 0536   LDLCALC 83 07/11/2018   LDLCALC 66 11/27/2017 1011   Hepatic Function Panel     Component Value Date/Time   PROT 6.0 04/23/2018 1159   ALBUMIN 4.0 04/23/2018 1159   AST 19 07/11/2018   ALT 33 07/11/2018   ALKPHOS 55 07/11/2018   BILITOT 0.3 04/23/2018 1159      Component Value Date/Time   TSH 0.651 11/27/2017 1011   TSH 2.410 03/08/2017 1152   TSH 1.083 08/06/2016 0647   TSH 1.871 01/19/2016 2302   TSH 0.206 (L) 03/10/2011 1351   Results for [place], Shinika G "[name]" (MRN [id number]) as of 09/12/2018 07:32  Ref. Range 04/23/2018 11:59  Vitamin D, 25-Hydroxy Latest Ref Range: 30.0 - 100.0 ng/mL 65.8    OBESITY BEHAVIORAL INTERVENTION VISIT  Today's visit was # 27   Starting weight: 257 lbs Starting date: 03/08/17 Today's weight : Weight: 200 lb (90.7 kg)  Today's date: 09/11/2018 Total lbs lost to date: 20 At least 15 minutes were spent on discussing the following behavioral intervention visit.  ASK: We discussed the diagnosis of obesity with Toni [name] today and Toni [name] agreed to give [name] permission to discuss obesity behavioral modification therapy today.  ASSESS: Polly has the diagnosis of obesity and her BMI today is 35.4. Zariya is in the action stage of change.   ADVISE: Savaya was educated on the multiple health risks of obesity as well as the benefit of weight loss to improve her health. She was advised of the need for long term treatment and the importance of lifestyle modifications to improve her current health and to decrease her risk of future health problems.  AGREE: Multiple dietary modification options and treatment options were discussed and [place] agreed to follow the recommendations documented in the above note.  ARRANGE: Cici was educated on the importance of frequent visits to treat obesity as outlined per CMS and USPSTF guidelines and agreed to schedule her next follow up appointment today.  IMarcille Blanco, am acting  as transcriptionist for Starlyn Skeans, MD  I have reviewed the above documentation for accuracy and completeness, and I agree with the above. -Dennard Nip, MD

## 2018-09-12 NOTE — Telephone Encounter (Signed)
PA for Acthar completed via cover my meds.  PA # [id number] Key [id number]. Pt notified PA has been submitted. Waiting on response.

## 2018-09-13 LAB — COMPREHENSIVE METABOLIC PANEL
ALT: 33 IU/L — ABNORMAL HIGH (ref 0–32)
AST: 20 IU/L (ref 0–40)
Albumin/Globulin Ratio: 2.3 — ABNORMAL HIGH (ref 1.2–2.2)
Albumin: 4.6 g/dL (ref 3.7–4.7)
Alkaline Phosphatase: 62 IU/L (ref 39–117)
BILIRUBIN TOTAL: 0.3 mg/dL (ref 0.0–1.2)
BUN / CREAT RATIO: 37 — AB (ref 12–28)
BUN: 25 mg/dL (ref 8–27)
CHLORIDE: 106 mmol/L (ref 96–106)
CO2: 23 mmol/L (ref 20–29)
Calcium: 9.7 mg/dL (ref 8.7–10.3)
Creatinine, Ser: 0.68 mg/dL (ref 0.57–1.00)
GFR calc non Af Amer: 88 mL/min/{1.73_m2} (ref >59)
GFR, EST AFRICAN AMERICAN: 102 mL/min/{1.73_m2} (ref >59)
Globulin, Total: 2 g/dL (ref 1.5–4.5)
Glucose: 114 mg/dL — ABNORMAL HIGH (ref 65–99)
Potassium: 4.4 mmol/L (ref 3.5–5.2)
Sodium: 143 mmol/L (ref 134–144)
TOTAL PROTEIN: 6.6 g/dL (ref 6.0–8.5)

## 2018-09-13 LAB — VITAMIN D 25 HYDROXY (VIT D DEFICIENCY, FRACTURES): Vit D, 25-Hydroxy: 57.5 ng/mL (ref 30.0–100.0)

## 2018-09-13 LAB — INSULIN, RANDOM: INSULIN: 13 u[IU]/mL (ref 2.6–24.9)

## 2018-09-16 ENCOUNTER — Telehealth: Payer: Self-pay | Admitting: Neurology

## 2018-09-16 NOTE — Telephone Encounter (Signed)
Lattie Haw from [employer] called and said that Dr. Collene Mares will not be able to see this patient after review of the notes. You can reach her office with any question's [phone]

## 2018-09-16 NOTE — Telephone Encounter (Signed)
PA for Acthar denied through cover my meds. Appeal letter formulated. Waiting for Dr. Brett Fairy to review/sign.

## 2018-09-17 NOTE — Telephone Encounter (Signed)
Appeal letter signed by Dr. Brett Fairy and faxed to [employer] appeals department. Fax # [phone].  Confirmation of fax received.

## 2018-09-19 DIAGNOSIS — M2560 Stiffness of unspecified joint, not elsewhere classified: Secondary | ICD-10-CM | POA: Diagnosis not present

## 2018-09-19 DIAGNOSIS — M6281 Muscle weakness (generalized): Secondary | ICD-10-CM | POA: Diagnosis not present

## 2018-09-19 DIAGNOSIS — M25661 Stiffness of right knee, not elsewhere classified: Secondary | ICD-10-CM | POA: Diagnosis not present

## 2018-09-19 DIAGNOSIS — M25662 Stiffness of left knee, not elsewhere classified: Secondary | ICD-10-CM | POA: Diagnosis not present

## 2018-09-23 ENCOUNTER — Encounter: Payer: Self-pay | Admitting: Gastroenterology

## 2018-09-23 NOTE — Telephone Encounter (Signed)
I called [facility] and sent the referral over to their office. I called to make the patient aware. The patient said she already had an apt at [facility] and Dr. Brett Fairy told her to see Dr. Collene Mares. I explained that Dr. Collene Mares would not accept the referral. She had questions regarding this so I offered her the number to Dr. Lorie Apley office but she stated she already has it. She is going to call [place] to get back on the schedule. DW

## 2018-09-24 ENCOUNTER — Telehealth: Payer: Self-pay | Admitting: Neurology

## 2018-09-24 ENCOUNTER — Encounter (INDEPENDENT_AMBULATORY_CARE_PROVIDER_SITE_OTHER): Payer: Self-pay | Admitting: Family Medicine

## 2018-09-24 DIAGNOSIS — G4730 Sleep apnea, unspecified: Secondary | ICD-10-CM

## 2018-09-24 DIAGNOSIS — I4891 Unspecified atrial fibrillation: Principal | ICD-10-CM

## 2018-09-24 DIAGNOSIS — R0681 Apnea, not elsewhere classified: Secondary | ICD-10-CM

## 2018-09-24 DIAGNOSIS — M25662 Stiffness of left knee, not elsewhere classified: Secondary | ICD-10-CM | POA: Diagnosis not present

## 2018-09-24 DIAGNOSIS — G4737 Central sleep apnea in conditions classified elsewhere: Secondary | ICD-10-CM

## 2018-09-24 DIAGNOSIS — M6281 Muscle weakness (generalized): Secondary | ICD-10-CM | POA: Diagnosis not present

## 2018-09-24 DIAGNOSIS — M2560 Stiffness of unspecified joint, not elsewhere classified: Secondary | ICD-10-CM | POA: Diagnosis not present

## 2018-09-24 DIAGNOSIS — M25661 Stiffness of right knee, not elsewhere classified: Secondary | ICD-10-CM | POA: Diagnosis not present

## 2018-09-24 NOTE — Telephone Encounter (Signed)
Called the patient and she states sat night she woke up with spasms so bad and she took acthar and then bacolfen. She states that she was able to get through Sunday and then sun night. Monday she took another because ot pain and spasms. She states that she went to PT today and he compared her to her therapy session last week and she has decreased less then 1/2 of the reps she was doing and when he was examining she complained of pain in areas where she had been having the spasms. She will take another acthar tomorrow but she wanted to know how much is to much when taking every other day and if it doesn't help and stop what is next? I advised that I would contact her with what Dr Brett Fairy recommends. She also informed me she is waking up frequently through night and wanted to pull download on machine and see if the new pressure setting is working. I pulled that and will call her back with recommendations from Dr Brett Fairy.

## 2018-09-24 NOTE — Telephone Encounter (Signed)
Pt states she is having MS exacerbation since Saturday night. She has taken acthar injection, eased symptoms up until yesterday. She took another acthar injection last night, did help. She is still experiencing tightness and muscle spasms in the trunk and feet. Slightest movement causes severe pain. She has taken baclofen tablet that did help some. Please call to advise

## 2018-09-24 NOTE — Telephone Encounter (Signed)
PA approved for the pt from jan 23,2020 until dec 31,2020 through [facility] [id number] Case number [id number]

## 2018-09-25 NOTE — Telephone Encounter (Signed)
Called the patient back and informed her to continue to take the achtar medication every other day for 5 doses. Patient states that she will do that and keep [name] posted on if she is having problems after she has completed the doses. I also had Dr Dohmeier review her Bipap data and she is pleased with where the apnea is present I discussed with her Dr Dohmeier's concern on how apnea is more significant on certain days. Patient states that she has not done anything differently. She avoids medication that can cause that at bedtime for that reason. It was only recently on feb 2 that night she took the extra baclofen. I told her I would note this. Pt was appreciative for the call back.

## 2018-09-27 DIAGNOSIS — M2560 Stiffness of unspecified joint, not elsewhere classified: Secondary | ICD-10-CM | POA: Diagnosis not present

## 2018-09-27 DIAGNOSIS — M6281 Muscle weakness (generalized): Secondary | ICD-10-CM | POA: Diagnosis not present

## 2018-09-27 DIAGNOSIS — M25661 Stiffness of right knee, not elsewhere classified: Secondary | ICD-10-CM | POA: Diagnosis not present

## 2018-09-27 DIAGNOSIS — M25662 Stiffness of left knee, not elsewhere classified: Secondary | ICD-10-CM | POA: Diagnosis not present

## 2018-09-30 ENCOUNTER — Ambulatory Visit (INDEPENDENT_AMBULATORY_CARE_PROVIDER_SITE_OTHER): Payer: Medicare Other | Admitting: *Deleted

## 2018-09-30 DIAGNOSIS — I4821 Permanent atrial fibrillation: Secondary | ICD-10-CM | POA: Diagnosis not present

## 2018-09-30 DIAGNOSIS — Z7901 Long term (current) use of anticoagulants: Secondary | ICD-10-CM

## 2018-09-30 LAB — POCT INR: INR: 2.5 (ref 2.0–3.0)

## 2018-09-30 NOTE — Patient Instructions (Signed)
Description   Continue with 1 tablet daily.  Repeat INR in 3 weeks

## 2018-10-01 DIAGNOSIS — M6281 Muscle weakness (generalized): Secondary | ICD-10-CM | POA: Diagnosis not present

## 2018-10-01 DIAGNOSIS — M25661 Stiffness of right knee, not elsewhere classified: Secondary | ICD-10-CM | POA: Diagnosis not present

## 2018-10-01 DIAGNOSIS — M2560 Stiffness of unspecified joint, not elsewhere classified: Secondary | ICD-10-CM | POA: Diagnosis not present

## 2018-10-01 DIAGNOSIS — M25662 Stiffness of left knee, not elsewhere classified: Secondary | ICD-10-CM | POA: Diagnosis not present

## 2018-10-07 ENCOUNTER — Ambulatory Visit (INDEPENDENT_AMBULATORY_CARE_PROVIDER_SITE_OTHER): Payer: Medicare Other | Admitting: Family Medicine

## 2018-10-07 ENCOUNTER — Encounter (INDEPENDENT_AMBULATORY_CARE_PROVIDER_SITE_OTHER): Payer: Self-pay | Admitting: Family Medicine

## 2018-10-07 VITALS — BP 116/72 | HR 95 | Temp 98.3°F | Ht 63.0 in | Wt 196.0 lb

## 2018-10-07 DIAGNOSIS — E669 Obesity, unspecified: Secondary | ICD-10-CM | POA: Diagnosis not present

## 2018-10-07 DIAGNOSIS — R7303 Prediabetes: Secondary | ICD-10-CM | POA: Diagnosis not present

## 2018-10-07 DIAGNOSIS — Z6834 Body mass index (BMI) 34.0-34.9, adult: Secondary | ICD-10-CM | POA: Diagnosis not present

## 2018-10-07 NOTE — Progress Notes (Signed)
Office: [phone]  /  Fax: [phone]   HPI:   Chief Complaint: OBESITY Toni [name] is here to discuss her progress with her obesity treatment plan. She is on the Category 1 plan and is following her eating plan approximately 80 to 85 % of the time. She states she is doing physical therapy 30 to 60 minutes 6 times per week. Toni [name] continues to do well on her Category 1 plan. Her hunger is controlled and she is doing well with exercising. She is having a MS flare, which is limiting her activity currently.  Her weight is 196 lb (88.9 kg) today and has had a weight loss of 4 pounds over a period of 3 weeks since her last visit. She has lost 61 lbs since starting treatment with [name].  Pre-Diabetes Toni [name] has a diagnosis of pre-diabetes based on her elevated Hgb A1c and was informed this puts her at greater risk of developing diabetes. She is not taking metformin currently and continues to work on diet and exercise to decrease risk of diabetes. She denies nausea, vomiting, or hypoglycemia.  ASSESSMENT AND PLAN:  Prediabetes  Class 1 obesity with serious comorbidity and body mass index (BMI) of 34.0 to 34.9 in adult, unspecified obesity type  PLAN:  Pre-Diabetes Toni [name] will continue to work on weight loss, exercise, and decreasing simple carbohydrates in her diet to help decrease the risk of diabetes. She was informed that eating too many simple carbohydrates or too many calories at one sitting increases the likelihood of GI side effects. Toni [name] agreed to continue with her diet and exercise. We will send labs to her PCP and monitor Toni [name] who agreed to follow up with [name] as directed to monitor her progress in 3 to 4 weeks.  I spent > than 50% of the 15 minute visit on counseling as documented in the note.  Obesity Toni [name] is currently in the action stage of change. As such, her goal is to continue with weight loss efforts. She has agreed to keep a food journal with 1000 to  1200 calories and 70+ grams of protein or follow the Category 1 plan. Toni [name] has been instructed to work up to a goal of 150 minutes of combined cardio and strengthening exercise per week for weight loss and overall health benefits. We discussed the following Behavioral Modification Strategies today: increasing lean protein intake, decreasing simple carbohydrates, no skipping meals, and work on meal planning and easy cooking plans.  Toni [name] has agreed to follow up with our clinic in 3 to 4 weeks. She was informed of the importance of frequent follow up visits to maximize her success with intensive lifestyle modifications for her multiple health conditions.  ALLERGIES: Allergies  Allergen Reactions  . Hydrocodone Nausea And Vomiting    Hydrocodone causes vomiting  . Oxycodone Nausea And Vomiting    Oral oxycodone causes vomiting  . Chlorhexidine Gluconate Rash    Sores at site  . Zoloft [Sertraline] Hives, Swelling and Rash    MEDICATIONS: Current Outpatient Medications on File Prior to Visit  Medication Sig Dispense Refill  . acetaminophen (TYLENOL) 500 MG tablet Take 500-1,000 mg by mouth every 6 (six) hours as needed for fever or headache (or pain).    Marland Kitchen ALPRAZolam (XANAX) 0.5 MG tablet Take 1 tablet (0.5 mg total) by mouth 2 (two) times daily as needed for anxiety. 60 tablet 0  . baclofen (LIORESAL) 20 MG tablet Once to two tablets a day for breakthrough spasms 180 tablet 0  . Biotin (  PA BIOTIN) 1000 MCG tablet Take 1,000 mcg by mouth daily.    Marland Kitchen buPROPion (WELLBUTRIN XL) 300 MG 24 hr tablet Take 1 tablet (300 mg total) by mouth daily. 30 tablet 0  . CALCIUM CITRATE PO Take 2 tablets by mouth 2 (two) times daily.    . cetirizine (ZYRTEC) 10 MG tablet Take 10 mg by mouth daily.    . Cholecalciferol (VITAMIN D) 2000 units tablet Take 1 tablet (2,000 Units total) by mouth 2 (two) times daily.  0  . co-enzyme Q-10 30 MG capsule Take 30 mg by mouth daily.    . corticotropin (ACTHAR)  80 UNIT/ML injectable gel INJECT 80 UNITS (1ML)  SUBCUTANEOUSLY EVERY OTHER  DAY FOR A TOTAL OF 10 DAYS. REPEAT EVERY 30 DAYS.  (DISCARD 28 DAYS AFTER 1ST  USE) 5 mL 5  . Cranberry 1000 MG CAPS Take by mouth.    . diltiazem (CARDIZEM CD) 180 MG 24 hr capsule TAKE 1 CAPSULE(180 MG) BY MOUTH DAILY 90 capsule 3  . docusate sodium (COLACE) 100 MG capsule Take 1 capsule (100 mg total) by mouth daily. 10 capsule 0  . famotidine (PEPCID) 20 MG tablet TK 1 T PO D  6  . fluticasone (FLONASE) 50 MCG/ACT nasal spray Place 2 sprays into both nostrils daily as needed for allergies.     . furosemide (LASIX) 40 MG tablet Take 80 mg by mouth daily.     Marland Kitchen gabapentin (NEURONTIN) 300 MG capsule TAKE 1 CAPSULE BY MOUTH UP  TO 4 TIMES DAILY 360 capsule 3  . lactulose, encephalopathy, (GENERLAC) 10 GM/15ML SOLN TAKE 15 MLS BY MOUTH TWICE DAILY AS NEEDED 946 mL 3  . levothyroxine (SYNTHROID, LEVOTHROID) 75 MCG tablet Take 75 mcg by mouth daily.    . Multiple Vitamins-Minerals (CENTRUM SILVER PO) Take 1 tablet by mouth daily.    . nebivolol (BYSTOLIC) 10 MG tablet Take 20 mg by mouth daily.     . Omega-3 Fatty Acids (FISH OIL) 1000 MG CAPS Take 1,000 mg by mouth daily.    Marland Kitchen omeprazole (PRILOSEC) 20 MG capsule Take 20 mg by mouth daily.     . psyllium (METAMUCIL SMOOTH TEXTURE) 28 % packet Take 1 packet by mouth every other day.    . simvastatin (ZOCOR) 10 MG tablet     . SYRINGE/NEEDLE, DISP, 1 ML (BD LUER-LOK SYRINGE) 25G X 5/8" 1 ML MISC Use as directed to inject  Acthar 10 each prn  . Tamsulosin HCl (FLOMAX) 0.4 MG CAPS Take 0.4 mg by mouth daily.    Marland Kitchen UNABLE TO FIND Baclofen pump: Continuous    . warfarin (COUMADIN) 5 MG tablet Take 1 tablet by mouth daily as directed by coumadin clinic. 90 tablet 1   Current Facility-Administered Medications on File Prior to Visit  Medication Dose Route Frequency Provider Last Rate Last Dose  . baclofen (LIORESAL) intrathecal injection 40 mg/71mL  40 mg Intrathecal Once Kathrynn Ducking, MD        PAST MEDICAL HISTORY: Past Medical History:  Diagnosis Date  . Abnormality of gait 03/01/2015  . Anxiety   . Arthritis    "knees" (01/19/2016)  . Atrial fibrillation with RVR ([place]) 01/19/2016  . Back pain   . Chest pain   . Childhood asthma   . Chronic pain    "nerve pain from the MS"  . Constipation   . Depression   . DVT (deep venous thrombosis) ([place]) 1983   "RLE; may have just been phlebitis;  it was before the age of dopplers"  . 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 GERD (gastroesophageal reflux disease)   . History of stress test 06/21/10   limited exam with some degree of breast attenuatin of the apex however a component of apical ischemia is not excluded, EF 62%; cardiac cath   . HLD (hyperlipidemia)   . Hx of echocardiogram 04/22/10   EF 55-60%, no valve issues  . Hypertension   . Hypothyroidism   . Joint pain   . Migraine    "none since I stopped beta blocker in 1990s" (01/19/2016)  . Multiple sclerosis ([place])    has had this may 1989-[facility] reg doc  . OSA on CPAP   . Osteoarthritis   . PAF (paroxysmal atrial fibrillation) ([place])    on coumadin; documented on monitor 06/2010  . Pneumonia 11/2011  . Shortness of breath   . Thyroid disease     PAST SURGICAL HISTORY: Past Surgical History:  Procedure Laterality Date  . ANTERIOR CERVICAL DECOMP/DISCECTOMY FUSION  01/2004  . BACK SURGERY    . CARDIAC CATHETERIZATION  06/22/2010   normal coronary arteries, PAF  . CATARACT EXTRACTION, BILATERAL Bilateral   . CHILD BIRTHS     X2  . DILATION AND CURETTAGE OF UTERUS    . INFUSION PUMP IMPLANTATION  ~ 2009   baclofen infusion in lower abd  . PAIN PUMP REVISION N/A 07/29/2014   Procedure: Baclofen pump replacement;  Surgeon: Erline Levine, MD;  Location: [facility];  Service: Neurosurgery;  Laterality: N/A;  Baclofen pump replacement  . TONSILLECTOMY AND ADENOIDECTOMY    . VARICOSE VEIN SURGERY   ~ 1968  . VENOUS ABLATION  12/16/2010   radiofreq ablation -Dr Elisabeth Cara and [facility]  . WISDOM TOOTH EXTRACTION      SOCIAL HISTORY: Social History   Tobacco Use  . Smoking status: Former Smoker    Packs/day: 0.50    Years: 7.00    Pack years: 3.50    Types: Cigarettes    Last attempt to quit: 08/21/1976    Years since quitting: 42.1  . Smokeless tobacco: Never Used  Substance Use Topics  . Alcohol use: Yes    Alcohol/week: 1.0 standard drinks    Types: 1 Glasses of wine per week  . Drug use: No    FAMILY HISTORY: Family History  Problem Relation Age of Onset  . Coronary artery disease Father        at age 69  . Hyperlipidemia Father   . Thyroid disease Father   . Coronary artery disease Maternal Grandmother   . Depression Maternal Grandmother   . Cancer Paternal Grandmother   . Depression Mother   . Sudden death Mother   . Anxiety disorder Mother   . Alcoholism Son     ROS: Review of Systems  Constitutional: Positive for weight loss.  Gastrointestinal: Negative for nausea and vomiting.  Endo/Heme/Allergies:       Negative for hypoglycemia.    PHYSICAL EXAM: Blood pressure 116/72, pulse 95, temperature 98.3 F (36.8 C), temperature source Oral, height 5\' 3"  (1.6 m), weight 196 lb (88.9 kg), SpO2 99 %. Body mass index is 34.72 kg/m. Physical Exam Vitals signs reviewed.  Constitutional:      Appearance: Normal appearance. She is obese.  Cardiovascular:     Rate and Rhythm: Normal rate.  Pulmonary:     Effort: Pulmonary effort is  normal.  Musculoskeletal: Normal range of motion.  Skin:    General: Skin is warm and dry.  Neurological:     Mental Status: She is alert and oriented to person, place, and time.  Psychiatric:        Mood and Affect: Mood normal.        Behavior: Behavior normal.     RECENT LABS AND TESTS: BMET    Component Value Date/Time   NA 143 09/11/2018 1317   K 4.4 09/11/2018 1317   CL 106 09/11/2018 1317   CO2 23 09/11/2018 1317     GLUCOSE 114 (H) 09/11/2018 1317   GLUCOSE 92 07/08/2017 1214   BUN 25 09/11/2018 1317   CREATININE 0.68 09/11/2018 1317   CALCIUM 9.7 09/11/2018 1317   GFRNONAA 88 09/11/2018 1317   GFRAA 102 09/11/2018 1317   Lab Results  Component Value Date   HGBA1C 5.3 09/11/2018   HGBA1C 5.3 04/23/2018   HGBA1C 5.2 10/01/2017   HGBA1C 5.4 03/08/2017   HGBA1C  04/21/2010    5.5 (NOTE)                                                                       According to the ADA Clinical Practice Recommendations for 2011, when HbA1c is used as a screening test:   >=6.5%   Diagnostic of Diabetes Mellitus           (if abnormal result  is confirmed)  5.7-6.4%   Increased risk of developing Diabetes Mellitus  References:Diagnosis and Classification of Diabetes Mellitus,Diabetes HBZJ,6967,89(FYBOF 1):S62-S69 and Standards of Medical Care in         Diabetes - 2011,Diabetes Care,2011,34  (Suppl 1):S11-S61.   Lab Results  Component Value Date   INSULIN 13.0 09/11/2018   INSULIN 7.8 04/23/2018   INSULIN 20.4 11/27/2017   INSULIN 20.4 03/08/2017   CBC    Component Value Date/Time   WBC 8.9 07/11/2018   WBC 8.4 07/08/2017 1214   RBC 4.83 07/08/2017 1214   HGB 14.6 07/11/2018   HGB 14.1 03/08/2017 1152   HCT 45 07/11/2018   HCT 43.0 03/08/2017 1152   PLT 185 10/01/2017   PLT 219 12/18/2014 1157   MCV 93.2 07/08/2017 1214   MCV 93 03/08/2017 1152   MCH 31.5 07/08/2017 1214   MCHC 33.8 07/08/2017 1214   RDW 13.3 07/08/2017 1214   RDW 13.3 03/08/2017 1152   LYMPHSABS 1.6 07/08/2017 1214   LYMPHSABS 2.2 03/08/2017 1152   MONOABS 1.3 (H) 07/08/2017 1214   EOSABS 0.2 07/08/2017 1214   EOSABS 0.1 03/08/2017 1152   BASOSABS 0.0 07/08/2017 1214   BASOSABS 0.0 03/08/2017 1152   Iron/TIBC/Ferritin/ %Sat No results found for: IRON, TIBC, FERRITIN, IRONPCTSAT Lipid Panel     Component Value Date/Time   CHOL 147 07/11/2018   CHOL 129 11/27/2017 1011   TRIG 56 07/11/2018   HDL 53 07/11/2018    HDL 51 11/27/2017 1011   CHOLHDL 3.0 04/22/2010 0536   VLDL 29 04/22/2010 0536   LDLCALC 83 07/11/2018   LDLCALC 66 11/27/2017 1011   Hepatic Function Panel     Component Value Date/Time   PROT 6.6 09/11/2018 1317   ALBUMIN 4.6 09/11/2018 1317   AST 20  09/11/2018 1317   ALT 33 (H) 09/11/2018 1317   ALKPHOS 62 09/11/2018 1317   BILITOT 0.3 09/11/2018 1317      Component Value Date/Time   TSH 0.651 11/27/2017 1011   TSH 2.410 03/08/2017 1152   TSH 1.083 08/06/2016 0647   TSH 1.871 01/19/2016 2302   TSH 0.206 (L) 03/10/2011 1351   Results for Denz, AMILA CALLIES "[name] (MRN [id number]) as of 10/07/2018 13:33  Ref. Range 09/11/2018 13:17  Vitamin D, 25-Hydroxy Latest Ref Range: 30.0 - 100.0 ng/mL 57.5    OBESITY BEHAVIORAL INTERVENTION VISIT  Today's visit was # 28   Starting weight: 257 lbs Starting date: 03/08/17 Today's weight : Weight: 196 lb (88.9 kg)  Today's date: 10/07/2018 Total lbs lost to date: 61    Most Recent Value 10/12/2013 - 10/11/2018  Height 5\' 3"  (1.6 m) 10/07/2018  Weight 196 lb (88.9 kg) 10/07/2018  BMI (Calculated) 34.73 10/07/2018  BLOOD PRESSURE - SYSTOLIC 035 5/97/4163  BLOOD PRESSURE - DIASTOLIC 72 8/45/3646  RMR 1156 09/11/2018   ASK: We discussed the diagnosis of obesity with Toni [name] today and Toni [name] agreed to give [name] permission to discuss obesity behavioral modification therapy today.  ASSESS: Toni [name] has the diagnosis of obesity and her BMI today is 34.7. Toni [name] is in the action stage of change.   ADVISE: Toni [name] was educated on the multiple health risks of obesity as well as the benefit of weight loss to improve her health. She was advised of the need for long term treatment and the importance of lifestyle modifications to improve her current health and to decrease her risk of future health problems.  AGREE: Multiple dietary modification options and treatment options were discussed and Toni [name] agreed to follow  the recommendations documented in the above note.  ARRANGE: Toni [name] was educated on the importance of frequent visits to treat obesity as outlined per CMS and USPSTF guidelines and agreed to schedule her next follow up appointment today.  IMarcille Blanco, CMA, am acting as transcriptionist for Starlyn Skeans, MD  I have reviewed the above documentation for accuracy and completeness, and I agree with the above. -Dennard Nip, MD

## 2018-10-08 ENCOUNTER — Other Ambulatory Visit: Payer: Self-pay | Admitting: Neurology

## 2018-10-08 DIAGNOSIS — M25661 Stiffness of right knee, not elsewhere classified: Secondary | ICD-10-CM | POA: Diagnosis not present

## 2018-10-08 DIAGNOSIS — M2560 Stiffness of unspecified joint, not elsewhere classified: Secondary | ICD-10-CM | POA: Diagnosis not present

## 2018-10-08 DIAGNOSIS — M25662 Stiffness of left knee, not elsewhere classified: Secondary | ICD-10-CM | POA: Diagnosis not present

## 2018-10-08 DIAGNOSIS — M6281 Muscle weakness (generalized): Secondary | ICD-10-CM | POA: Diagnosis not present

## 2018-10-08 MED ORDER — CORTICOTROPIN 80 UNIT/ML IJ GEL: INTRAMUSCULAR | 0 refills | Status: DC

## 2018-10-08 NOTE — Telephone Encounter (Signed)
Pt has called to inform RN Myriam Jacobson that about 2-3 weeks ago she had an exacerbation.  Pt has had her 5 shots of actar.  Pt states she is feeling better but it not 100% the way she felt before the exacerbation.  Pt is asking for a call from [facility] anytime after 3:30 this afternoon

## 2018-10-08 NOTE — Telephone Encounter (Signed)
Dr Dohmeier reviewed her data and was nervous about increasing the patient machine too much because the patient has central apnea's present and she doesn't want to increase the central apnea. She made a slight increase in the pressure 10/6 up from 9/5 cm water pressure and if this increases the central apnea then we will have to adjust back down.

## 2018-10-08 NOTE — Addendum Note (Signed)
Addended by: Darleen Crocker on: 10/08/2018 04:44 PM   Modules accepted: Orders

## 2018-10-08 NOTE — Telephone Encounter (Signed)
Called the patient back and she states that she feels better then when we last talked. She said since she took her medication out of time when it is usually due she is wandering if ok for her to go ahead and use acthar on her schedule time. I advised Dr Brett Fairy recommends that if she isnt 100% and her PT states she still showing signs of weakness and especially since she has the MS hug it would be ok. I will send a 1 time refill for the patient with no refills with hope they can send her another dose. Pt verbalized understanding. Patient still feels like her bipap machine needs to be increased. Advised the patient I will run it by Dr Brett Fairy once more and see if she would be ok bumping up some. Pt verbalized understanding.

## 2018-10-10 DIAGNOSIS — M25661 Stiffness of right knee, not elsewhere classified: Secondary | ICD-10-CM | POA: Diagnosis not present

## 2018-10-10 DIAGNOSIS — M2560 Stiffness of unspecified joint, not elsewhere classified: Secondary | ICD-10-CM | POA: Diagnosis not present

## 2018-10-10 DIAGNOSIS — M25662 Stiffness of left knee, not elsewhere classified: Secondary | ICD-10-CM | POA: Diagnosis not present

## 2018-10-10 DIAGNOSIS — M6281 Muscle weakness (generalized): Secondary | ICD-10-CM | POA: Diagnosis not present

## 2018-10-14 DIAGNOSIS — M2560 Stiffness of unspecified joint, not elsewhere classified: Secondary | ICD-10-CM | POA: Diagnosis not present

## 2018-10-14 DIAGNOSIS — M6281 Muscle weakness (generalized): Secondary | ICD-10-CM | POA: Diagnosis not present

## 2018-10-14 DIAGNOSIS — M25662 Stiffness of left knee, not elsewhere classified: Secondary | ICD-10-CM | POA: Diagnosis not present

## 2018-10-14 DIAGNOSIS — M25661 Stiffness of right knee, not elsewhere classified: Secondary | ICD-10-CM | POA: Diagnosis not present

## 2018-10-15 ENCOUNTER — Other Ambulatory Visit: Payer: Medicare Other

## 2018-10-15 ENCOUNTER — Encounter: Payer: Self-pay | Admitting: Gastroenterology

## 2018-10-15 ENCOUNTER — Ambulatory Visit (INDEPENDENT_AMBULATORY_CARE_PROVIDER_SITE_OTHER): Payer: Medicare Other | Admitting: Gastroenterology

## 2018-10-15 VITALS — BP 128/78 | HR 99 | Ht 63.0 in | Wt 196.0 lb

## 2018-10-15 DIAGNOSIS — K219 Gastro-esophageal reflux disease without esophagitis: Secondary | ICD-10-CM

## 2018-10-15 DIAGNOSIS — R1032 Left lower quadrant pain: Secondary | ICD-10-CM

## 2018-10-15 DIAGNOSIS — K5900 Constipation, unspecified: Secondary | ICD-10-CM | POA: Diagnosis not present

## 2018-10-15 MED ORDER — LINACLOTIDE 145 MCG PO CAPS: 145.0000 ug | ORAL_CAPSULE | Freq: Every day | ORAL | 2 refills | Status: DC

## 2018-10-15 NOTE — Progress Notes (Signed)
Referring Provider: Dennard Nip Primary Care Physician:  Prince Solian, MD   Reason for Consultation: Left lower quadrant abdominal pain   IMPRESSION:  Chronic constipation with sense of incomplete evacuation LLQ pain as the constipation progresses Reflux controlled on daily PPI and H2blocker Normal colonoscopy 12 years ago BMI 34.72  Constipation and anorectal dysfunction are common in MS. She would prefer to avoid endoscopy given the potential risks of sedation given her MS. Will attempt improved symptom control prior to additional testing.   PLAN: Obtain records from Dr. Earlean Shawl FIT test Increase Metamucil to daily regimen Continue Lactulose  Start Linzess 145 mcg daily Return to clinic in 2 months   HPI: Toni [name] is a 73 y.o. retired Paramedic high school Psychologist, prison and probation services. Dr. Leafy Ro made the referral to evaluate for LLQ pain. The history is obtained through the patient and review of her electronic health record. She has MS x 30 years, OSA on NIPAP, spasms, renal cell carcinoma, obesity, prediabetes/insuin resistance, vitamin D deficiency. She has lost over 60 pounds in Dr. Migdalia Dk weight loss clinic. She has a distant history of diverticulitis.   Previously followed by Dr. Allyn Kenner for routine colon cancer screening.  His daughter was his nanny. She is interested in a second opinion.   Chronic constipation that she attributes to MS. Severe straining to have a BM. Uses lactulose and fiber QOD and stool softener most days with some improvement.  She has been on a high protein diet with Dr. Leafy Ro over the last year and has had more difficulty with having a full BM. She will feel the urge to go but is unable to defecate.  Feels like she is sitting on pressure.  Previously had a good response to colonics. Intermittently uses Miralax or Dulcolax if her regular regimen is not working.   If she has not had a BM in 2-3 days, she will develop intermittent, LLQ pain which  improves with defecation. Stools are brown. No blood or mucous. No other associated symptoms. No identified exacerbating or relieving features.   Severe reflux on PPI therapy for many years. On omeprazole for many years. Pneumonia related to reflux and Zantac was added QHS. When ranitidine was removed, she was switch to Pepcid 20 mg QHS. Hoase and cough return if she tries to self-wean.   Last colonoscopy 12 years ago. No polyps at that time. She has been reluctant to have a colonoscopy given her MS.  She had a normal endoscopy 8 years ago.   Past Medical History:  Diagnosis Date  . Abnormality of gait 03/01/2015  . Anxiety   . Arthritis    "knees" (01/19/2016)  . Atrial fibrillation with RVR ([place]) 01/19/2016  . Back pain   . Chest pain   . Childhood asthma   . Chronic pain    "nerve pain from the MS"  . Constipation   . Depression   . DVT (deep venous thrombosis) ([place]) 1983   "RLE; may have just been phlebitis; it was before the age of dopplers"  . 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 GERD (gastroesophageal reflux disease)   . History of stress test 06/21/10   limited exam with some degree of breast attenuatin of the apex however a component of apical ischemia is not excluded, EF 62%; cardiac cath   . HLD (hyperlipidemia)   . Hx of echocardiogram 04/22/10   EF 55-60%, no  valve issues  . Hypertension   . Hypothyroidism   . Joint pain   . Migraine    "none since I stopped beta blocker in 1990s" (01/19/2016)  . Multiple sclerosis ([place])    has had this may 1989-[facility] reg doc  . OSA on CPAP   . Osteoarthritis   . PAF (paroxysmal atrial fibrillation) ([place])    on coumadin; documented on monitor 06/2010  . Pneumonia 11/2011  . Shortness of breath   . Thyroid disease     Past Surgical History:  Procedure Laterality Date  . ANTERIOR CERVICAL DECOMP/DISCECTOMY FUSION  01/2004  . BACK SURGERY    . CARDIAC CATHETERIZATION   06/22/2010   normal coronary arteries, PAF  . CATARACT EXTRACTION, BILATERAL Bilateral   . CHILD BIRTHS     X2  . DILATION AND CURETTAGE OF UTERUS    . INFUSION PUMP IMPLANTATION  ~ 2009   baclofen infusion in lower abd  . PAIN PUMP REVISION N/A 07/29/2014   Procedure: Baclofen pump replacement;  Surgeon: Erline Levine, MD;  Location: [facility];  Service: Neurosurgery;  Laterality: N/A;  Baclofen pump replacement  . TONSILLECTOMY AND ADENOIDECTOMY    . VARICOSE VEIN SURGERY  ~ 1968  . VENOUS ABLATION  12/16/2010   radiofreq ablation -Dr Elisabeth Cara and [facility]  . WISDOM TOOTH EXTRACTION      Current Outpatient Medications  Medication Sig Dispense Refill  . acetaminophen (TYLENOL) 500 MG tablet Take 500-1,000 mg by mouth every 6 (six) hours as needed for fever or headache (or pain).    Marland Kitchen ALPRAZolam (XANAX) 0.5 MG tablet Take 1 tablet (0.5 mg total) by mouth 2 (two) times daily as needed for anxiety. 60 tablet 0  . baclofen (LIORESAL) 20 MG tablet Once to two tablets a day for breakthrough spasms 180 tablet 0  . Biotin (PA BIOTIN) 1000 MCG tablet Take 1,000 mcg by mouth daily.    Marland Kitchen buPROPion (WELLBUTRIN XL) 300 MG 24 hr tablet Take 1 tablet (300 mg total) by mouth daily. 30 tablet 0  . CALCIUM CITRATE PO Take 2 tablets by mouth 2 (two) times daily.    . cetirizine (ZYRTEC) 10 MG tablet Take 10 mg by mouth daily.    . Cholecalciferol (VITAMIN D) 2000 units tablet Take 1 tablet (2,000 Units total) by mouth 2 (two) times daily.  0  . co-enzyme Q-10 30 MG capsule Take 30 mg by mouth daily.    . corticotropin (ACTHAR) 80 UNIT/ML injectable gel INJECT 80 UNITS (1ML)  SUBCUTANEOUSLY EVERY OTHER  DAY FOR A TOTAL OF 10 DAYS. REPEAT EVERY 30 DAYS.  (DISCARD 28 DAYS AFTER 1ST  USE) 5 mL 0  . Cranberry 1000 MG CAPS Take by mouth.    . diltiazem (CARDIZEM CD) 180 MG 24 hr capsule TAKE 1 CAPSULE(180 MG) BY MOUTH DAILY 90 capsule 3  . docusate sodium (COLACE) 100 MG capsule Take 1 capsule (100 mg total) by  mouth daily. 10 capsule 0  . famotidine (PEPCID) 20 MG tablet TK 1 T PO D  6  . fluticasone (FLONASE) 50 MCG/ACT nasal spray Place 2 sprays into both nostrils daily as needed for allergies.     . furosemide (LASIX) 40 MG tablet Take 80 mg by mouth daily.     Marland Kitchen gabapentin (NEURONTIN) 300 MG capsule TAKE 1 CAPSULE BY MOUTH UP  TO 4 TIMES DAILY 360 capsule 3  . lactulose, encephalopathy, (GENERLAC) 10 GM/15ML SOLN TAKE 15 MLS BY MOUTH TWICE DAILY AS  NEEDED 946 mL 3  . levothyroxine (SYNTHROID, LEVOTHROID) 75 MCG tablet Take 75 mcg by mouth daily.    . Multiple Vitamins-Minerals (CENTRUM SILVER PO) Take 1 tablet by mouth daily.    . nebivolol (BYSTOLIC) 10 MG tablet Take 20 mg by mouth daily.     . Omega-3 Fatty Acids (FISH OIL) 1000 MG CAPS Take 1,000 mg by mouth daily.    Marland Kitchen omeprazole (PRILOSEC) 20 MG capsule Take 20 mg by mouth daily.     . psyllium (METAMUCIL SMOOTH TEXTURE) 28 % packet Take 1 packet by mouth every other day.    . simvastatin (ZOCOR) 10 MG tablet     . SYRINGE/NEEDLE, DISP, 1 ML (BD LUER-LOK SYRINGE) 25G X 5/8" 1 ML MISC Use as directed to inject  Acthar 10 each prn  . Tamsulosin HCl (FLOMAX) 0.4 MG CAPS Take 0.4 mg by mouth daily.    Marland Kitchen UNABLE TO FIND Baclofen pump: Continuous    . warfarin (COUMADIN) 5 MG tablet Take 1 tablet by mouth daily as directed by coumadin clinic. 90 tablet 1  . linaclotide (LINZESS) 145 MCG CAPS capsule Take 1 capsule (145 mcg total) by mouth daily before breakfast. 30 capsule 2   Current Facility-Administered Medications  Medication Dose Route Frequency Provider Last Rate Last Dose  . baclofen (LIORESAL) intrathecal injection 40 mg/47mL  40 mg Intrathecal Once Kathrynn Ducking, MD        Allergies as of 10/15/2018 - Review Complete 10/15/2018  Allergen Reaction Noted  . Hydrocodone Nausea And Vomiting 11/26/2011  . Oxycodone Nausea And Vomiting 05/12/2013  . Chlorhexidine gluconate Rash 11/26/2011  . Zoloft [sertraline] Hives, Swelling, and  Rash 05/15/2013    Family History  Problem Relation Age of Onset  . Coronary artery disease Father        at age 67  . Hyperlipidemia Father   . Thyroid disease Father   . Coronary artery disease Maternal Grandmother   . Depression Maternal Grandmother   . Cancer Paternal Grandmother   . Depression Mother   . Sudden death Mother   . Anxiety disorder Mother   . Alcoholism Son     Social History   Socioeconomic History  . Marital status: Married    Spouse name: [name]  . Number of children: 2  . Years of education: COLLEGE  . Highest education level: Not on file  Occupational History  . Occupation: RETIRED  Social Needs  . Financial resource strain: Not on file  . Food insecurity:    Worry: Not on file    Inability: Not on file  . Transportation needs:    Medical: Not on file    Non-medical: Not on file  Tobacco Use  . Smoking status: Former Smoker    Packs/day: 0.50    Years: 7.00    Pack years: 3.50    Types: Cigarettes    Last attempt to quit: 08/21/1976    Years since quitting: 42.1  . Smokeless tobacco: Never Used  Substance and Sexual Activity  . Alcohol use: Yes    Alcohol/week: 1.0 standard drinks    Types: 1 Glasses of wine per week  . Drug use: No  . Sexual activity: Not Currently  Lifestyle  . Physical activity:    Days per week: Not on file    Minutes per session: Not on file  . Stress: Not on file  Relationships  . Social connections:    Talks on phone: Not on file  Gets together: Not on file    Attends religious service: Not on file    Active member of club or organization: Not on file    Attends meetings of clubs or organizations: Not on file    Relationship status: Not on file  . Intimate partner violence:    Fear of current or ex partner: Not on file    Emotionally abused: Not on file    Physically abused: Not on file    Forced sexual activity: Not on file  Other Topics Concern  . Not on file  Social History Narrative   Lives in [place]  with Husband [name] [phone], [phone]   Currently in rehab    Review of Systems: 12 system ROS is negative except as noted above with the exceptions of vision changes, fatigue, muscles pains attributes to MS, insomnia, swelling, urine leakage.  Filed Weights   10/15/18 1437  Weight: 196 lb (88.9 kg)    Physical Exam: Vital signs were reviewed. General:   Alert, well-nourished, pleasant and cooperative in NAD. Sitting in a wheelchair. Obese.  Head:  Normocephalic and atraumatic. Eyes:  Sclera clear, no icterus.   Conjunctiva pink. Mouth:  No deformity or lesions.   Neck:  Supple; no thyromegaly. Lungs:  Clear throughout to auscultation.   No wheezes. Heart:  Regular rate and rhythm; no murmurs Abdomen:  Soft, nontender, obese, normal bowel sounds. No rebound or guarding. No hepatosplenomegaly Rectal:  Deferred  Msk:  Symmetrical without gross deformities. Extremities:  No gross deformities or edema. Neurologic:  Alert and  oriented x4;  grossly nonfocal Skin:  No rash or bruise. Psych:  Alert and cooperative. Normal mood and affect.   Lindon Kiel L. Tarri Glenn, MD, MPH [facility] 10/15/2018, 5:08 PM

## 2018-10-15 NOTE — Patient Instructions (Addendum)
If you are age 72 or older, your body mass index should be between 23-30. Your Body mass index is 34.72 kg/m. If this is out of the aforementioned range listed, please consider follow up with your Primary Care Provider.   Your provider has requested that you go to the basement level for lab work before leaving today. Press "B" on the elevator. The lab is located at the first door on the left as you exit the elevator.   We have sent the following medications to your pharmacy for you to pick up at your convenience: [place] 145mg  once daily.   A high fiber diet with plenty of fluids (up to 8 glasses of water daily) is suggested to relieve these symptoms.  Metamucil, 1 tablespoon once or twice daily can be used to keep bowels regular if needed.  We will see you back in the office in 2 months.   Thank you for choosing me and [facility].  Dr. Tarri Glenn

## 2018-10-16 ENCOUNTER — Other Ambulatory Visit: Payer: Self-pay | Admitting: Neurology

## 2018-10-21 DIAGNOSIS — M6281 Muscle weakness (generalized): Secondary | ICD-10-CM | POA: Diagnosis not present

## 2018-10-21 DIAGNOSIS — M25662 Stiffness of left knee, not elsewhere classified: Secondary | ICD-10-CM | POA: Diagnosis not present

## 2018-10-21 DIAGNOSIS — M2560 Stiffness of unspecified joint, not elsewhere classified: Secondary | ICD-10-CM | POA: Diagnosis not present

## 2018-10-21 DIAGNOSIS — M25661 Stiffness of right knee, not elsewhere classified: Secondary | ICD-10-CM | POA: Diagnosis not present

## 2018-10-24 ENCOUNTER — Ambulatory Visit (INDEPENDENT_AMBULATORY_CARE_PROVIDER_SITE_OTHER): Payer: Medicare Other | Admitting: Pharmacist Clinician (PhC)/ Clinical Pharmacy Specialist

## 2018-10-24 DIAGNOSIS — I4821 Permanent atrial fibrillation: Secondary | ICD-10-CM | POA: Diagnosis not present

## 2018-10-24 DIAGNOSIS — Z7901 Long term (current) use of anticoagulants: Secondary | ICD-10-CM

## 2018-10-24 DIAGNOSIS — M25662 Stiffness of left knee, not elsewhere classified: Secondary | ICD-10-CM | POA: Diagnosis not present

## 2018-10-24 DIAGNOSIS — M25661 Stiffness of right knee, not elsewhere classified: Secondary | ICD-10-CM | POA: Diagnosis not present

## 2018-10-24 DIAGNOSIS — M6281 Muscle weakness (generalized): Secondary | ICD-10-CM | POA: Diagnosis not present

## 2018-10-24 DIAGNOSIS — M2560 Stiffness of unspecified joint, not elsewhere classified: Secondary | ICD-10-CM | POA: Diagnosis not present

## 2018-10-24 LAB — POCT INR: INR: 2 (ref 2.0–3.0)

## 2018-10-26 ENCOUNTER — Encounter (INDEPENDENT_AMBULATORY_CARE_PROVIDER_SITE_OTHER): Payer: Self-pay | Admitting: Family Medicine

## 2018-10-28 ENCOUNTER — Encounter (INDEPENDENT_AMBULATORY_CARE_PROVIDER_SITE_OTHER): Payer: Self-pay

## 2018-10-28 ENCOUNTER — Ambulatory Visit (INDEPENDENT_AMBULATORY_CARE_PROVIDER_SITE_OTHER): Payer: Self-pay | Admitting: Family Medicine

## 2018-10-29 ENCOUNTER — Other Ambulatory Visit: Payer: Self-pay | Admitting: Neurology

## 2018-10-30 ENCOUNTER — Other Ambulatory Visit: Payer: Self-pay | Admitting: Internal Medicine

## 2018-10-31 DIAGNOSIS — M25662 Stiffness of left knee, not elsewhere classified: Secondary | ICD-10-CM | POA: Diagnosis not present

## 2018-10-31 DIAGNOSIS — M25661 Stiffness of right knee, not elsewhere classified: Secondary | ICD-10-CM | POA: Diagnosis not present

## 2018-10-31 DIAGNOSIS — M2560 Stiffness of unspecified joint, not elsewhere classified: Secondary | ICD-10-CM | POA: Diagnosis not present

## 2018-10-31 DIAGNOSIS — M6281 Muscle weakness (generalized): Secondary | ICD-10-CM | POA: Diagnosis not present

## 2018-11-07 ENCOUNTER — Telehealth: Payer: Self-pay

## 2018-11-07 NOTE — Telephone Encounter (Signed)
COVID-19 Pre-Screening Questions:  Provider: HILTY  . Have you been in contact with someone that was recently sick with fever/cough or confirmed to have the [place] virus?  NO  *Contact with a confirmed case should stay at home, away from confirmed patient, monitor symptoms, and reach out to PCP for e-visit/additional testing.  2. Do you have any of the following symptoms [cough, fever (100.4 or greater)], and/or shortness of breath)?  NO  *ALL PTS W/ FEVER SHOULD BE REFERRED TO PCP FOR E-VISIT* _________________________________________________  Cardiac Questionnaire:    Since your last visit or hospitalization:    1. Have you been having chest pain? NO   2. Have you been having shortness of breath? NO   3. Have you been having increasing edema, wt gain, or increase in abdominal girth (pants fitting more tightly)? NO   4. Have you had any passing out spells? NO    *A YES to any of these questions would result in the appointment being kept.  *If all the answers to these questions are NO, we should indicate that given the current situation regarding the worldwide coronarvirus pandemic, at the recommendation of the CDC, we are looking to limit gatherings in our waiting area, and thus will reschedule their appointment beyond four weeks from today.

## 2018-11-08 ENCOUNTER — Telehealth: Payer: Self-pay | Admitting: Internal Medicine

## 2018-11-08 DIAGNOSIS — N3000 Acute cystitis without hematuria: Secondary | ICD-10-CM | POA: Diagnosis not present

## 2018-11-08 NOTE — Telephone Encounter (Signed)
Patient appointment pushed out an additional 3 weeks as they are very stable

## 2018-11-08 NOTE — Telephone Encounter (Signed)
  Patient would like to discuss rescheduling her coumadin appt if it is okay to do so

## 2018-11-11 ENCOUNTER — Other Ambulatory Visit: Payer: Self-pay | Admitting: Neurology

## 2018-11-11 MED ORDER — CORTICOTROPIN 80 UNIT/ML IJ GEL: INTRAMUSCULAR | 5 refills | Status: DC

## 2018-11-13 ENCOUNTER — Ambulatory Visit (INDEPENDENT_AMBULATORY_CARE_PROVIDER_SITE_OTHER): Payer: Self-pay | Admitting: Family Medicine

## 2018-11-13 ENCOUNTER — Ambulatory Visit: Payer: Medicare Other | Admitting: Internal Medicine

## 2018-11-14 ENCOUNTER — Encounter (INDEPENDENT_AMBULATORY_CARE_PROVIDER_SITE_OTHER): Payer: Self-pay

## 2018-11-24 ENCOUNTER — Encounter (INDEPENDENT_AMBULATORY_CARE_PROVIDER_SITE_OTHER): Payer: Self-pay | Admitting: Family Medicine

## 2018-11-25 ENCOUNTER — Telehealth: Payer: Self-pay | Admitting: Neurology

## 2018-11-25 NOTE — Telephone Encounter (Signed)
Called the patient and informed her that Dr Brett Fairy states that it is fine for her to continue her acthar as prescribed. Pt verbalized understanding. Went over making sure using good hand hygiene, wearing a mask if she goes out as well as staying 6 ft apart. Pt verbalized understanding.

## 2018-11-25 NOTE — Telephone Encounter (Signed)
Pt called in wanting to know if its ok for her to still take her corticotropin (ACTHAR) 80 UNIT/ML injectable gel or will it mess with her immune system right now

## 2018-11-27 ENCOUNTER — Ambulatory Visit (INDEPENDENT_AMBULATORY_CARE_PROVIDER_SITE_OTHER): Payer: Self-pay | Admitting: Family Medicine

## 2018-11-27 DIAGNOSIS — K5900 Constipation, unspecified: Secondary | ICD-10-CM | POA: Diagnosis not present

## 2018-11-27 DIAGNOSIS — Z1331 Encounter for screening for depression: Secondary | ICD-10-CM | POA: Diagnosis not present

## 2018-11-27 DIAGNOSIS — R7301 Impaired fasting glucose: Secondary | ICD-10-CM | POA: Diagnosis not present

## 2018-11-27 DIAGNOSIS — I10 Essential (primary) hypertension: Secondary | ICD-10-CM | POA: Diagnosis not present

## 2018-11-27 DIAGNOSIS — I480 Paroxysmal atrial fibrillation: Secondary | ICD-10-CM | POA: Diagnosis not present

## 2018-11-27 DIAGNOSIS — E669 Obesity, unspecified: Secondary | ICD-10-CM | POA: Diagnosis not present

## 2018-11-27 DIAGNOSIS — I872 Venous insufficiency (chronic) (peripheral): Secondary | ICD-10-CM | POA: Diagnosis not present

## 2018-11-27 DIAGNOSIS — E039 Hypothyroidism, unspecified: Secondary | ICD-10-CM | POA: Diagnosis not present

## 2018-11-27 DIAGNOSIS — C642 Malignant neoplasm of left kidney, except renal pelvis: Secondary | ICD-10-CM | POA: Diagnosis not present

## 2018-11-27 DIAGNOSIS — G35 Multiple sclerosis: Secondary | ICD-10-CM | POA: Diagnosis not present

## 2018-11-27 DIAGNOSIS — G4733 Obstructive sleep apnea (adult) (pediatric): Secondary | ICD-10-CM | POA: Diagnosis not present

## 2018-11-27 DIAGNOSIS — E785 Hyperlipidemia, unspecified: Secondary | ICD-10-CM | POA: Diagnosis not present

## 2018-11-28 ENCOUNTER — Other Ambulatory Visit: Payer: Self-pay

## 2018-11-28 ENCOUNTER — Encounter (INDEPENDENT_AMBULATORY_CARE_PROVIDER_SITE_OTHER): Payer: Self-pay | Admitting: Family Medicine

## 2018-11-28 ENCOUNTER — Ambulatory Visit (INDEPENDENT_AMBULATORY_CARE_PROVIDER_SITE_OTHER): Payer: Medicare Other | Admitting: Family Medicine

## 2018-11-28 DIAGNOSIS — E669 Obesity, unspecified: Secondary | ICD-10-CM

## 2018-11-28 DIAGNOSIS — E559 Vitamin D deficiency, unspecified: Secondary | ICD-10-CM | POA: Diagnosis not present

## 2018-11-28 DIAGNOSIS — Z6834 Body mass index (BMI) 34.0-34.9, adult: Secondary | ICD-10-CM

## 2018-12-02 ENCOUNTER — Telehealth: Payer: Self-pay

## 2018-12-02 NOTE — Telephone Encounter (Signed)
lmom for prescreen/drive thru aware 

## 2018-12-02 NOTE — Progress Notes (Signed)
Office: [phone]  /  Fax: [phone] TeleHealth Visit:  Toni [name] has verbally consented to this TeleHealth visit today. The patient is located at home, the provider is located at the [facility] office. The participants in this visit include the listed provider and patient. Toni [name] was unable to use realtime audiovisual technology today and the telehealth visit was conducted via telephone.   HPI:   Chief Complaint: OBESITY Koralyn is here to discuss her progress with her obesity treatment plan. She is on the keep a food journal with 1000-1200 calories and 70+ grams of protein daily or follow the Category 1 plan and is following her eating plan approximately 95 % of the time. She states she is doing leg and arm exercises for 15-30 minutes 6 times per week. Face time connection failed. Toni [name] has been doing well with her eating plan. She has struggled with finding all of her food in the grocery store due to shortages. She has some cravings in the PM, but is trying to make better choices. She states Linzess was added to her medications. She states that she possible lost weight, she weighed 187 per her scale which is the best she could get.  We were unable to weigh the patient today for this TeleHealth visit. She feels as if she has lost weight since her last visit. She has lost 61-70 lbs since starting treatment with [name].  Vitamin D Deficiency Toni [name] has a diagnosis of vitamin D deficiency. Her Vit D level is well controlled on OTC Vit D. Last Vit D level at goal. She denies nausea, vomiting or muscle weakness.  ASSESSMENT AND PLAN:  Vitamin D deficiency  Class 1 obesity with serious comorbidity and body mass index (BMI) of 34.0 to 34.9 in adult, unspecified obesity type  PLAN:  Vitamin D Deficiency Toni [name] was informed that low vitamin D levels contributes to fatigue and are associated with obesity, breast, and colon cancer. Toni [name] agrees to  continue taking OTC Vit D 2,000 IU daily and will follow up for routine testing of vitamin D, at least 2-3 times per year. She was informed of the risk of over-replacement of vitamin D and agrees to not increase her dose unless she discusses this with [name] first. We will recheck labs in 2 months. Toni [name] agrees to follow up with our clinic in 3 weeks.  I spent > than 50% of the 15 minute visit on counseling as documented in the note.  Obesity Toni [name] is currently in the action stage of change. As such, her goal is to continue with weight loss efforts She has agreed to follow the Category 1 plan Toni [name] has been instructed to work up to a goal of 150 minutes of combined cardio and strengthening exercise per week for weight loss and overall health benefits. We discussed the following Behavioral Modification Strategies today: work on meal planning and easy cooking plans, holiday eating strategies  and ways to avoid boredom eating   Toni [name] has agreed to follow up with our clinic in 3 weeks. She was informed of the importance of frequent follow up visits to maximize her success with intensive lifestyle modifications for her multiple health conditions.  ALLERGIES: Allergies  Allergen Reactions  . Hydrocodone Nausea And Vomiting    Hydrocodone causes vomiting  . Oxycodone Nausea And Vomiting    Oral oxycodone causes vomiting  . Chlorhexidine Gluconate Rash    Sores at site  . Zoloft [Sertraline] Hives, Swelling and Rash  MEDICATIONS: Current Outpatient Medications on File Prior to Visit  Medication Sig Dispense Refill  . acetaminophen (TYLENOL) 500 MG tablet Take 500-1,000 mg by mouth every 6 (six) hours as needed for fever or headache (or pain).    Marland Kitchen ALPRAZolam (XANAX) 0.5 MG tablet Take 1 tablet (0.5 mg total) by mouth 2 (two) times daily as needed for anxiety. 60 tablet 0  . baclofen (LIORESAL) 20 MG tablet Once to two tablets a day for breakthrough spasms 180 tablet 0  . Biotin  (PA BIOTIN) 1000 MCG tablet Take 1,000 mcg by mouth daily.    Marland Kitchen buPROPion (WELLBUTRIN XL) 300 MG 24 hr tablet Take 1 tablet (300 mg total) by mouth daily. 30 tablet 0  . CALCIUM CITRATE PO Take 2 tablets by mouth 2 (two) times daily.    . cetirizine (ZYRTEC) 10 MG tablet Take 10 mg by mouth daily.    . Cholecalciferol (VITAMIN D) 2000 units tablet Take 1 tablet (2,000 Units total) by mouth 2 (two) times daily.  0  . co-enzyme Q-10 30 MG capsule Take 30 mg by mouth daily.    . corticotropin (ACTHAR) 80 UNIT/ML injectable gel INJECT 80 UNITS (1ML)  SUBCUTANEOUSLY EVERY OTHER  DAY FOR A TOTAL OF 10 DAYS. REPEAT EVERY 30 DAYS.  (DISCARD 28 DAYS AFTER 1ST  USE) 5 mL 5  . Cranberry 1000 MG CAPS Take by mouth.    . diltiazem (CARDIZEM CD) 180 MG 24 hr capsule TAKE 1 CAPSULE(180 MG) BY MOUTH DAILY 90 capsule 3  . docusate sodium (COLACE) 100 MG capsule Take 1 capsule (100 mg total) by mouth daily. 10 capsule 0  . famotidine (PEPCID) 20 MG tablet TK 1 T PO D  6  . fluticasone (FLONASE) 50 MCG/ACT nasal spray Place 2 sprays into both nostrils daily as needed for allergies.     . furosemide (LASIX) 40 MG tablet Take 80 mg by mouth daily.     Marland Kitchen gabapentin (NEURONTIN) 300 MG capsule TAKE 1 CAPSULE BY MOUTH UP  TO 4 TIMES DAILY 360 capsule 3  . lactulose, encephalopathy, (GENERLAC) 10 GM/15ML SOLN Take 15 mLs (10 g total) by mouth 2 (two) times daily as needed. 2746 mL 0  . levothyroxine (SYNTHROID, LEVOTHROID) 75 MCG tablet Take 75 mcg by mouth daily.    Marland Kitchen linaclotide (LINZESS) 145 MCG CAPS capsule Take 1 capsule (145 mcg total) by mouth daily before breakfast. 30 capsule 2  . Multiple Vitamins-Minerals (CENTRUM SILVER PO) Take 1 tablet by mouth daily.    . nebivolol (BYSTOLIC) 10 MG tablet Take 20 mg by mouth daily.     . Omega-3 Fatty Acids (FISH OIL) 1000 MG CAPS Take 1,000 mg by mouth daily.    Marland Kitchen omeprazole (PRILOSEC) 20 MG capsule Take 20 mg by mouth daily.     . psyllium (METAMUCIL SMOOTH TEXTURE) 28 %  packet Take 1 packet by mouth every other day.    . simvastatin (ZOCOR) 10 MG tablet     . SYRINGE/NEEDLE, DISP, 1 ML (BD LUER-LOK SYRINGE) 25G X 5/8" 1 ML MISC Use as directed to inject  Acthar 10 each prn  . Tamsulosin HCl (FLOMAX) 0.4 MG CAPS Take 0.4 mg by mouth daily.    Marland Kitchen UNABLE TO FIND Baclofen pump: Continuous    . warfarin (COUMADIN) 5 MG tablet TAKE 1 TABLET BY MOUTH  DAILY AS DIRECTED BY  COUMADIN CLINIC 90 tablet 1   Current Facility-Administered Medications on File Prior to Visit  Medication Dose  Route Frequency Provider Last Rate Last Dose  . baclofen (LIORESAL) intrathecal injection 40 mg/87mL  40 mg Intrathecal Once Kathrynn Ducking, MD        PAST MEDICAL HISTORY: Past Medical History:  Diagnosis Date  . Abnormality of gait 03/01/2015  . Anxiety   . Arthritis    "knees" (01/19/2016)  . Atrial fibrillation with RVR ([place]) 01/19/2016  . Back pain   . Chest pain   . Childhood asthma   . Chronic pain    "nerve pain from the MS"  . Constipation   . Depression   . DVT (deep venous thrombosis) ([place]) 1983   "RLE; may have just been phlebitis; it was before the age of dopplers"  . 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 GERD (gastroesophageal reflux disease)   . History of stress test 06/21/10   limited exam with some degree of breast attenuatin of the apex however a component of apical ischemia is not excluded, EF 62%; cardiac cath   . HLD (hyperlipidemia)   . Hx of echocardiogram 04/22/10   EF 55-60%, no valve issues  . Hypertension   . Hypothyroidism   . Joint pain   . Migraine    "none since I stopped beta blocker in 1990s" (01/19/2016)  . Multiple sclerosis ([place])    has had this may 1989-[facility] reg doc  . OSA on CPAP   . Osteoarthritis   . PAF (paroxysmal atrial fibrillation) ([place])    on coumadin; documented on monitor 06/2010  . Pneumonia 11/2011  . Shortness of breath   . Thyroid disease     PAST  SURGICAL HISTORY: Past Surgical History:  Procedure Laterality Date  . ANTERIOR CERVICAL DECOMP/DISCECTOMY FUSION  01/2004  . BACK SURGERY    . CARDIAC CATHETERIZATION  06/22/2010   normal coronary arteries, PAF  . CATARACT EXTRACTION, BILATERAL Bilateral   . CHILD BIRTHS     X2  . DILATION AND CURETTAGE OF UTERUS    . INFUSION PUMP IMPLANTATION  ~ 2009   baclofen infusion in lower abd  . PAIN PUMP REVISION N/A 07/29/2014   Procedure: Baclofen pump replacement;  Surgeon: Erline Levine, MD;  Location: [facility];  Service: Neurosurgery;  Laterality: N/A;  Baclofen pump replacement  . TONSILLECTOMY AND ADENOIDECTOMY    . VARICOSE VEIN SURGERY  ~ 1968  . VENOUS ABLATION  12/16/2010   radiofreq ablation -Dr Elisabeth Cara [facility]  . WISDOM TOOTH EXTRACTION      SOCIAL HISTORY: Social History   Tobacco Use  . Smoking status: Former Smoker    Packs/day: 0.50    Years: 7.00    Pack years: 3.50    Types: Cigarettes    Last attempt to quit: 08/21/1976    Years since quitting: 42.3  . Smokeless tobacco: Never Used  Substance Use Topics  . Alcohol use: Yes    Alcohol/week: 1.0 standard drinks    Types: 1 Glasses of wine per week  . Drug use: No    FAMILY HISTORY: Family History  Problem Relation Age of Onset  . Coronary artery disease Father        at age 7  . Hyperlipidemia Father   . Thyroid disease Father   . Coronary artery disease Maternal Grandmother   . Depression Maternal Grandmother   . Cancer Paternal Grandmother   . Depression Mother   . Sudden death Mother   .  Anxiety disorder Mother   . Alcoholism Son     ROS: Review of Systems  Constitutional: Positive for weight loss.  Gastrointestinal: Negative for nausea and vomiting.  Musculoskeletal:       Negative muscle weakness    PHYSICAL EXAM: Pt in no acute distress  RECENT LABS AND TESTS: BMET    Component Value Date/Time   NA 143 09/11/2018 1317   K 4.4 09/11/2018 1317   CL 106 09/11/2018 1317   CO2 23  09/11/2018 1317   GLUCOSE 114 (H) 09/11/2018 1317   GLUCOSE 92 07/08/2017 1214   BUN 25 09/11/2018 1317   CREATININE 0.68 09/11/2018 1317   CALCIUM 9.7 09/11/2018 1317   GFRNONAA 88 09/11/2018 1317   GFRAA 102 09/11/2018 1317   Lab Results  Component Value Date   HGBA1C 5.3 09/11/2018   HGBA1C 5.3 04/23/2018   HGBA1C 5.2 10/01/2017   HGBA1C 5.4 03/08/2017   HGBA1C  04/21/2010    5.5 (NOTE)                                                                       According to the ADA Clinical Practice Recommendations for 2011, when HbA1c is used as a screening test:   >=6.5%   Diagnostic of Diabetes Mellitus           (if abnormal result  is confirmed)  5.7-6.4%   Increased risk of developing Diabetes Mellitus  References:Diagnosis and Classification of Diabetes Mellitus,Diabetes TDVV,6160,73(XTGGY 1):S62-S69 and Standards of Medical Care in         Diabetes - 2011,Diabetes Care,2011,34  (Suppl 1):S11-S61.   Lab Results  Component Value Date   INSULIN 13.0 09/11/2018   INSULIN 7.8 04/23/2018   INSULIN 20.4 11/27/2017   INSULIN 20.4 03/08/2017   CBC    Component Value Date/Time   WBC 8.9 07/11/2018   WBC 8.4 07/08/2017 1214   RBC 4.83 07/08/2017 1214   HGB 14.6 07/11/2018   HGB 14.1 03/08/2017 1152   HCT 45 07/11/2018   HCT 43.0 03/08/2017 1152   PLT 185 10/01/2017   PLT 219 12/18/2014 1157   MCV 93.2 07/08/2017 1214   MCV 93 03/08/2017 1152   MCH 31.5 07/08/2017 1214   MCHC 33.8 07/08/2017 1214   RDW 13.3 07/08/2017 1214   RDW 13.3 03/08/2017 1152   LYMPHSABS 1.6 07/08/2017 1214   LYMPHSABS 2.2 03/08/2017 1152   MONOABS 1.3 (H) 07/08/2017 1214   EOSABS 0.2 07/08/2017 1214   EOSABS 0.1 03/08/2017 1152   BASOSABS 0.0 07/08/2017 1214   BASOSABS 0.0 03/08/2017 1152   Iron/TIBC/Ferritin/ %Sat No results found for: IRON, TIBC, FERRITIN, IRONPCTSAT Lipid Panel     Component Value Date/Time   CHOL 147 07/11/2018   CHOL 129 11/27/2017 1011   TRIG 56 07/11/2018   HDL 53  07/11/2018   HDL 51 11/27/2017 1011   CHOLHDL 3.0 04/22/2010 0536   VLDL 29 04/22/2010 0536   LDLCALC 83 07/11/2018   LDLCALC 66 11/27/2017 1011   Hepatic Function Panel     Component Value Date/Time   PROT 6.6 09/11/2018 1317   ALBUMIN 4.6 09/11/2018 1317   AST 20 09/11/2018 1317   ALT 33 (H) 09/11/2018 1317   ALKPHOS 62 09/11/2018  1317   BILITOT 0.3 09/11/2018 1317      Component Value Date/Time   TSH 0.651 11/27/2017 1011   TSH 2.410 03/08/2017 1152   TSH 1.083 08/06/2016 0647   TSH 1.871 01/19/2016 2302   TSH 0.206 (L) 03/10/2011 1351      I, Trixie Dredge, am acting as transcriptionist for Dennard Nip, MD I have reviewed the above documentation for accuracy and completeness, and I agree with the above. -Dennard Nip, MD

## 2018-12-02 NOTE — Telephone Encounter (Signed)

## 2018-12-03 ENCOUNTER — Ambulatory Visit (INDEPENDENT_AMBULATORY_CARE_PROVIDER_SITE_OTHER): Payer: Medicare Other

## 2018-12-03 ENCOUNTER — Other Ambulatory Visit: Payer: Self-pay

## 2018-12-03 DIAGNOSIS — I4821 Permanent atrial fibrillation: Secondary | ICD-10-CM | POA: Diagnosis not present

## 2018-12-03 DIAGNOSIS — Z7901 Long term (current) use of anticoagulants: Secondary | ICD-10-CM

## 2018-12-03 LAB — POCT INR: INR: 2.5 (ref 2.0–3.0)

## 2018-12-03 NOTE — Patient Instructions (Signed)
Description   Called spoke with pt, advised to continue on same dosage 1 tablet daily.  Repeat INR in 6 weeks

## 2018-12-17 ENCOUNTER — Other Ambulatory Visit: Payer: Self-pay

## 2018-12-17 ENCOUNTER — Encounter: Payer: Self-pay | Admitting: Neurology

## 2018-12-17 ENCOUNTER — Ambulatory Visit (INDEPENDENT_AMBULATORY_CARE_PROVIDER_SITE_OTHER): Payer: Medicare Other | Admitting: Neurology

## 2018-12-17 ENCOUNTER — Ambulatory Visit: Payer: Self-pay | Admitting: Neurology

## 2018-12-17 VITALS — BP 104/59 | HR 68 | Temp 97.1°F | Wt 187.0 lb

## 2018-12-17 DIAGNOSIS — G35 Multiple sclerosis: Secondary | ICD-10-CM | POA: Diagnosis not present

## 2018-12-17 MED ORDER — BACLOFEN 40 MG/20ML IT SOLN
80.0000 mg | Freq: Once | INTRATHECAL | Status: AC
  Administered 2018-12-17: 80 mg via INTRATHECAL

## 2018-12-17 NOTE — Progress Notes (Signed)
Please refer to baclofen pump refill procedure note.

## 2018-12-17 NOTE — Procedures (Signed)
     History:  Toni [name] is a 73 year old patient with a history of multiple sclerosis with a spastic paraparesis.  The patient returns for baclofen pump refill today.  She has been doing relatively well with her spasticity, she may occasionally have to take an oral baclofen.  Baclofen pump refill note  The baclofen pump site was cleaned with Betadine solution. A 21-gauge needle was inserted into the pump port site. Approximately 7 cc of residual baclofen was removed, the pump indicates a residual of 2.2 cc.  40 cc of replacement baclofen was placed into the pump at 2000 Mcg/cc concentration.  The pump was reprogrammed for the following settings: The pump rate was kept as a simple continuous rate of 728.7 mcg/day  The alarm volume is set at 1.5 cc. The next alarm date is April 01, 2019.  The patient tolerated the procedure well. There were no complications of the above procedure.  There is a fairly large discrepancy between the reservoir volume indicated and the amount of baclofen removed from the pump, the pump does not appear to be functioning as efficiently as it should.  This will need to be monitored.  The patient has not noted any change in spasticity levels.  The [place] number is [phone] The baclofen expiration date is January 2022. The baclofen lot number is [id number].

## 2018-12-18 ENCOUNTER — Ambulatory Visit (INDEPENDENT_AMBULATORY_CARE_PROVIDER_SITE_OTHER): Payer: Medicare Other | Admitting: Family Medicine

## 2018-12-18 ENCOUNTER — Other Ambulatory Visit: Payer: Self-pay

## 2018-12-18 ENCOUNTER — Encounter (INDEPENDENT_AMBULATORY_CARE_PROVIDER_SITE_OTHER): Payer: Self-pay | Admitting: Family Medicine

## 2018-12-18 DIAGNOSIS — Z6834 Body mass index (BMI) 34.0-34.9, adult: Secondary | ICD-10-CM

## 2018-12-18 DIAGNOSIS — I10 Essential (primary) hypertension: Secondary | ICD-10-CM

## 2018-12-18 DIAGNOSIS — E669 Obesity, unspecified: Secondary | ICD-10-CM

## 2018-12-18 NOTE — Progress Notes (Signed)
Office: [phone]  /  Fax: [phone] TeleHealth Visit:  Toni [name] has verbally consented to this TeleHealth visit today. The patient is located at home, the provider is located at the [facility] office. The participants in this visit include the listed provider and patient. The visit was conducted today via Face Time.  HPI:   Chief Complaint: OBESITY Toni [name] is here to discuss her progress with her obesity treatment plan. She is on the Category 1 plan and is following her eating plan approximately 80 % of the time. She states she is doing leg and arm exercises 15 to 20 minutes 4 to 5 times per week. Toni [name] continues to do well with her diet prescription and weight loss efforts. She thinks that she has lost another 2 pounds since her last visit. Her hunger is controlled and she is doing well with meal planning and prepping.  We were unable to weigh the patient today for this TeleHealth visit. She feels as if she has lost weight since her last visit. She has lost 61 lbs since starting treatment with [name].  Hypertension Toni [name] is a 73 y.o. female with hypertension. Toni [name]'s blood pressure is running 105-116/65-72. She is stable on medications and doing well with his diet. She is working on weight loss to help control her blood pressure with the goal of decreasing her risk of heart attack and stroke. Toni [name] denies dizziness or lightheadedness.  ASSESSMENT AND PLAN:  Essential hypertension  Class 1 obesity with serious comorbidity and body mass index (BMI) of 34.0 to 34.9 in adult, unspecified obesity type  PLAN:  Hypertension We discussed sodium restriction, working on healthy weight loss, and a regular exercise program as the means to achieve improved blood pressure control. We will continue to monitor her blood pressure as well as her progress with the above lifestyle modifications. She will continue her medications as prescribed and diet and  she will watch for signs of hypotension as she continues her lifestyle modifications. Toni [name] agreed with this plan and agreed to follow up as directed in 2 weeks.  Obesity Toni [name] is currently in the action stage of change. As such, her goal is to continue with weight loss efforts. She has agreed to follow the Category 1 plan. Toni [name] has been instructed to work up to a goal of 150 minutes of combined cardio and strengthening exercise per week for weight loss and overall health benefits. We discussed the following Behavioral Modification Strategies today: increasing lean protein intake, increasing vegetables, and work on meal planning and easy cooking plans.  Toni [name] has agreed to follow up with our clinic in 2 weeks. She was informed of the importance of frequent follow up visits to maximize her success with intensive lifestyle modifications for her multiple health conditions.  ALLERGIES: Allergies  Allergen Reactions  . Hydrocodone Nausea And Vomiting    Hydrocodone causes vomiting  . Oxycodone Nausea And Vomiting    Oral oxycodone causes vomiting  . Chlorhexidine Gluconate Rash    Sores at site  . Zoloft [Sertraline] Hives, Swelling and Rash    MEDICATIONS: Current Outpatient Medications on File Prior to Visit  Medication Sig Dispense Refill  . acetaminophen (TYLENOL) 500 MG tablet Take 500-1,000 mg by mouth every 6 (six) hours as needed for fever or headache (or pain).    Marland Kitchen ALPRAZolam (XANAX) 0.5 MG tablet Take 1 tablet (0.5 mg total) by mouth 2 (two) times daily as needed for anxiety. 60 tablet 0  .  baclofen (LIORESAL) 20 MG tablet Once to two tablets a day for breakthrough spasms 180 tablet 0  . Biotin (PA BIOTIN) 1000 MCG tablet Take 1,000 mcg by mouth daily.    Marland Kitchen buPROPion (WELLBUTRIN XL) 300 MG 24 hr tablet Take 1 tablet (300 mg total) by mouth daily. 30 tablet 0  . CALCIUM CITRATE PO Take 2 tablets by mouth 2 (two) times daily.    . cetirizine (ZYRTEC) 10 MG tablet  Take 10 mg by mouth daily.    . Cholecalciferol (VITAMIN D) 2000 units tablet Take 1 tablet (2,000 Units total) by mouth 2 (two) times daily.  0  . co-enzyme Q-10 30 MG capsule Take 30 mg by mouth daily.    . corticotropin (ACTHAR) 80 UNIT/ML injectable gel INJECT 80 UNITS (1ML)  SUBCUTANEOUSLY EVERY OTHER  DAY FOR A TOTAL OF 10 DAYS. REPEAT EVERY 30 DAYS.  (DISCARD 28 DAYS AFTER 1ST  USE) 5 mL 5  . Cranberry 1000 MG CAPS Take by mouth.    . diltiazem (CARDIZEM CD) 180 MG 24 hr capsule TAKE 1 CAPSULE(180 MG) BY MOUTH DAILY 90 capsule 3  . docusate sodium (COLACE) 100 MG capsule Take 1 capsule (100 mg total) by mouth daily. 10 capsule 0  . famotidine (PEPCID) 20 MG tablet TK 1 T PO D  6  . fluticasone (FLONASE) 50 MCG/ACT nasal spray Place 2 sprays into both nostrils daily as needed for allergies.     . furosemide (LASIX) 40 MG tablet Take 80 mg by mouth daily.     Marland Kitchen gabapentin (NEURONTIN) 300 MG capsule TAKE 1 CAPSULE BY MOUTH UP  TO 4 TIMES DAILY 360 capsule 3  . lactulose, encephalopathy, (GENERLAC) 10 GM/15ML SOLN Take 15 mLs (10 g total) by mouth 2 (two) times daily as needed. 2746 mL 0  . levothyroxine (SYNTHROID, LEVOTHROID) 75 MCG tablet Take 75 mcg by mouth daily.    Marland Kitchen linaclotide (LINZESS) 145 MCG CAPS capsule Take 1 capsule (145 mcg total) by mouth daily before breakfast. 30 capsule 2  . Multiple Vitamins-Minerals (CENTRUM SILVER PO) Take 1 tablet by mouth daily.    . nebivolol (BYSTOLIC) 10 MG tablet Take 20 mg by mouth daily.     . Omega-3 Fatty Acids (FISH OIL) 1000 MG CAPS Take 1,000 mg by mouth daily.    Marland Kitchen omeprazole (PRILOSEC) 20 MG capsule Take 20 mg by mouth daily.     . psyllium (METAMUCIL SMOOTH TEXTURE) 28 % packet Take 1 packet by mouth every other day.    . simvastatin (ZOCOR) 10 MG tablet     . SYRINGE/NEEDLE, DISP, 1 ML (BD LUER-LOK SYRINGE) 25G X 5/8" 1 ML MISC Use as directed to inject  Acthar 10 each prn  . Tamsulosin HCl (FLOMAX) 0.4 MG CAPS Take 0.4 mg by mouth  daily.    Marland Kitchen UNABLE TO FIND Baclofen pump: Continuous    . warfarin (COUMADIN) 5 MG tablet TAKE 1 TABLET BY MOUTH  DAILY AS DIRECTED BY  COUMADIN CLINIC 90 tablet 1   Current Facility-Administered Medications on File Prior to Visit  Medication Dose Route Frequency Provider Last Rate Last Dose  . baclofen (LIORESAL) intrathecal injection 40 mg/56mL  40 mg Intrathecal Once Kathrynn Ducking, MD        PAST MEDICAL HISTORY: Past Medical History:  Diagnosis Date  . Abnormality of gait 03/01/2015  . Anxiety   . Arthritis    "knees" (01/19/2016)  . Atrial fibrillation with RVR ([facility]) 01/19/2016  .  Back pain   . Chest pain   . Childhood asthma   . Chronic pain    "nerve pain from the MS"  . Constipation   . Depression   . DVT (deep venous thrombosis) ([facility]) 1983   "RLE; may have just been phlebitis; it was before the age of dopplers"  . 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 GERD (gastroesophageal reflux disease)   . History of stress test 06/21/10   limited exam with some degree of breast attenuatin of the apex however a component of apical ischemia is not excluded, EF 62%; cardiac cath   . HLD (hyperlipidemia)   . Hx of echocardiogram 04/22/10   EF 55-60%, no valve issues  . Hypertension   . Hypothyroidism   . Joint pain   . Migraine    "none since I stopped beta blocker in 1990s" (01/19/2016)  . Multiple sclerosis ([facility])    has had this may 1989-[facility] reg doc  . OSA on CPAP   . Osteoarthritis   . PAF (paroxysmal atrial fibrillation) ([facility])    on coumadin; documented on monitor 06/2010  . Pneumonia 11/2011  . Shortness of breath   . Thyroid disease     PAST SURGICAL HISTORY: Past Surgical History:  Procedure Laterality Date  . ANTERIOR CERVICAL DECOMP/DISCECTOMY FUSION  01/2004  . BACK SURGERY    . CARDIAC CATHETERIZATION  06/22/2010   normal coronary arteries, PAF  . CATARACT EXTRACTION, BILATERAL Bilateral   . CHILD  BIRTHS     X2  . DILATION AND CURETTAGE OF UTERUS    . INFUSION PUMP IMPLANTATION  ~ 2009   baclofen infusion in lower abd  . PAIN PUMP REVISION N/A 07/29/2014   Procedure: Baclofen pump replacement;  Surgeon: Erline Levine, MD;  Location: [facility];  Service: Neurosurgery;  Laterality: N/A;  Baclofen pump replacement  . TONSILLECTOMY AND ADENOIDECTOMY    . VARICOSE VEIN SURGERY  ~ 1968  . VENOUS ABLATION  12/16/2010   radiofreq ablation -Dr Elisabeth Cara and [facility]  . WISDOM TOOTH EXTRACTION      SOCIAL HISTORY: Social History   Tobacco Use  . Smoking status: Former Smoker    Packs/day: 0.50    Years: 7.00    Pack years: 3.50    Types: Cigarettes    Last attempt to quit: 08/21/1976    Years since quitting: 42.3  . Smokeless tobacco: Never Used  Substance Use Topics  . Alcohol use: Yes    Alcohol/week: 1.0 standard drinks    Types: 1 Glasses of wine per week  . Drug use: No    FAMILY HISTORY: Family History  Problem Relation Age of Onset  . Coronary artery disease Father        at age 72  . Hyperlipidemia Father   . Thyroid disease Father   . Coronary artery disease Maternal Grandmother   . Depression Maternal Grandmother   . Cancer Paternal Grandmother   . Depression Mother   . Sudden death Mother   . Anxiety disorder Mother   . Alcoholism Son     ROS: Review of Systems  Neurological: Negative for dizziness.       Negative for lightheadedness.    PHYSICAL EXAM: Pt in no acute distress  RECENT LABS AND TESTS: BMET    Component Value Date/Time   NA 143 09/11/2018 1317   K 4.4 09/11/2018 1317   CL  106 09/11/2018 1317   CO2 23 09/11/2018 1317   GLUCOSE 114 (H) 09/11/2018 1317   GLUCOSE 92 07/08/2017 1214   BUN 25 09/11/2018 1317   CREATININE 0.68 09/11/2018 1317   CALCIUM 9.7 09/11/2018 1317   GFRNONAA 88 09/11/2018 1317   GFRAA 102 09/11/2018 1317   Lab Results  Component Value Date   HGBA1C 5.3 09/11/2018   HGBA1C 5.3 04/23/2018   HGBA1C 5.2  10/01/2017   HGBA1C 5.4 03/08/2017   HGBA1C  04/21/2010    5.5 (NOTE)                                                                       According to the ADA Clinical Practice Recommendations for 2011, when HbA1c is used as a screening test:   >=6.5%   Diagnostic of Diabetes Mellitus           (if abnormal result  is confirmed)  5.7-6.4%   Increased risk of developing Diabetes Mellitus  References:Diagnosis and Classification of Diabetes Mellitus,Diabetes SWNI,6270,35(KKXFG 1):S62-S69 and Standards of Medical Care in         Diabetes - 2011,Diabetes Care,2011,34  (Suppl 1):S11-S61.   Lab Results  Component Value Date   INSULIN 13.0 09/11/2018   INSULIN 7.8 04/23/2018   INSULIN 20.4 11/27/2017   INSULIN 20.4 03/08/2017   CBC    Component Value Date/Time   WBC 8.9 07/11/2018   WBC 8.4 07/08/2017 1214   RBC 4.83 07/08/2017 1214   HGB 14.6 07/11/2018   HGB 14.1 03/08/2017 1152   HCT 45 07/11/2018   HCT 43.0 03/08/2017 1152   PLT 185 10/01/2017   PLT 219 12/18/2014 1157   MCV 93.2 07/08/2017 1214   MCV 93 03/08/2017 1152   MCH 31.5 07/08/2017 1214   MCHC 33.8 07/08/2017 1214   RDW 13.3 07/08/2017 1214   RDW 13.3 03/08/2017 1152   LYMPHSABS 1.6 07/08/2017 1214   LYMPHSABS 2.2 03/08/2017 1152   MONOABS 1.3 (H) 07/08/2017 1214   EOSABS 0.2 07/08/2017 1214   EOSABS 0.1 03/08/2017 1152   BASOSABS 0.0 07/08/2017 1214   BASOSABS 0.0 03/08/2017 1152   Iron/TIBC/Ferritin/ %Sat No results found for: IRON, TIBC, FERRITIN, IRONPCTSAT Lipid Panel     Component Value Date/Time   CHOL 147 07/11/2018   CHOL 129 11/27/2017 1011   TRIG 56 07/11/2018   HDL 53 07/11/2018   HDL 51 11/27/2017 1011   CHOLHDL 3.0 04/22/2010 0536   VLDL 29 04/22/2010 0536   LDLCALC 83 07/11/2018   LDLCALC 66 11/27/2017 1011   Hepatic Function Panel     Component Value Date/Time   PROT 6.6 09/11/2018 1317   ALBUMIN 4.6 09/11/2018 1317   AST 20 09/11/2018 1317   ALT 33 (H) 09/11/2018 1317   ALKPHOS  62 09/11/2018 1317   BILITOT 0.3 09/11/2018 1317      Component Value Date/Time   TSH 0.651 11/27/2017 1011   TSH 2.410 03/08/2017 1152   TSH 1.083 08/06/2016 0647   TSH 1.871 01/19/2016 2302   TSH 0.206 (L) 03/10/2011 1351   Results for Elsbernd, Carynn G "[name]" (MRN [id number]) as of 12/18/2018 11:31  Ref. Range 09/11/2018 13:17  Vitamin D, 25-Hydroxy Latest Ref Range: 30.0 - 100.0 ng/mL  57.5    I, Marcille Blanco, CMA, am acting as transcriptionist for Starlyn Skeans, MD I have reviewed the above documentation for accuracy and completeness, and I agree with the above. -Dennard Nip, MD

## 2018-12-24 ENCOUNTER — Ambulatory Visit: Payer: Medicare Other | Admitting: Orthopaedic Surgery

## 2018-12-24 ENCOUNTER — Telehealth: Payer: Self-pay | Admitting: Neurology

## 2018-12-24 NOTE — Telephone Encounter (Signed)
I contacted the pt Pt is requesting for her baclofen pump to be adjusted and amount of med be increased. Pt reported the pump setting from 12/17/18 is not proving benefit to her.  Pt was advised she could come in at 4 pm tomorrow for MD to evaluate pump. I advised I would send a message to Dr. [name] to review and further advise if need be.

## 2018-12-24 NOTE — Telephone Encounter (Signed)
Okay for the patient to come in anytime for a pump adjustment.

## 2018-12-24 NOTE — Telephone Encounter (Signed)
Pt states she needs her pump adjusted. Please advise.

## 2018-12-24 NOTE — Telephone Encounter (Signed)
Noted  

## 2018-12-25 ENCOUNTER — Telehealth: Payer: Self-pay | Admitting: Neurology

## 2018-12-25 NOTE — Telephone Encounter (Signed)
The patient came in today for a baclofen pump adjustment, the rate was increased by 3% from a simple continuous rate of 728.7 mcg/day to a rate of 750.9 mcg/day.  The next alarm date is 29 March 2019.  She will contact me if she is needs another adjustment.

## 2018-12-26 ENCOUNTER — Telehealth: Payer: Self-pay

## 2018-12-26 NOTE — Telephone Encounter (Signed)
Patient has been rescheduled for baclofen pump refill. Appt date 03/27/19 at 10:30. Pt has been notified and confirmed appt date/time

## 2018-12-26 NOTE — Telephone Encounter (Signed)
-----   Message from Kathrynn Ducking, MD sent at 12/25/2018  4:40 PM EDT ----- This patient will need a revisit sometime before 29 March 2019 for baclofen pump refill.  Thank you.

## 2018-12-31 ENCOUNTER — Other Ambulatory Visit: Payer: Self-pay | Admitting: Neurology

## 2018-12-31 ENCOUNTER — Ambulatory Visit: Payer: Medicare Other | Admitting: Orthopaedic Surgery

## 2018-12-31 DIAGNOSIS — G609 Hereditary and idiopathic neuropathy, unspecified: Secondary | ICD-10-CM

## 2019-01-01 ENCOUNTER — Ambulatory Visit (INDEPENDENT_AMBULATORY_CARE_PROVIDER_SITE_OTHER): Payer: Medicare Other | Admitting: Family Medicine

## 2019-01-01 ENCOUNTER — Other Ambulatory Visit: Payer: Self-pay

## 2019-01-01 ENCOUNTER — Encounter (INDEPENDENT_AMBULATORY_CARE_PROVIDER_SITE_OTHER): Payer: Self-pay | Admitting: Family Medicine

## 2019-01-01 ENCOUNTER — Ambulatory Visit: Payer: Medicare Other | Admitting: Orthopaedic Surgery

## 2019-01-01 DIAGNOSIS — E669 Obesity, unspecified: Secondary | ICD-10-CM | POA: Diagnosis not present

## 2019-01-01 DIAGNOSIS — Z6834 Body mass index (BMI) 34.0-34.9, adult: Secondary | ICD-10-CM | POA: Diagnosis not present

## 2019-01-01 DIAGNOSIS — M7918 Myalgia, other site: Secondary | ICD-10-CM | POA: Diagnosis not present

## 2019-01-02 DIAGNOSIS — L89309 Pressure ulcer of unspecified buttock, unspecified stage: Secondary | ICD-10-CM | POA: Diagnosis not present

## 2019-01-02 DIAGNOSIS — G35 Multiple sclerosis: Secondary | ICD-10-CM | POA: Diagnosis not present

## 2019-01-02 NOTE — Progress Notes (Signed)
Office: [phone]  /  Fax: [phone] TeleHealth Visit:  Toni [name] has verbally consented to this TeleHealth visit today. The patient is located at home, the provider is located at the [facility] office. The participants in this visit include the listed provider, patient, and her spouse [name]. The visit was conducted today via [email or web address].  HPI:   Chief Complaint: OBESITY Toni [name] is here to discuss her progress with her obesity treatment plan. She is on the Category 1 plan and is following her eating plan approximately 90 to 95 % of the time. She states she is doing bed exercises 15 minutes 5 to 6 times per week. [place] has done very well with her weight loss efforts. She was unable to stand to weigh herself today, but thinks that she has lost a but more weight. She is trying to stay active and do daily strengthening exercises.  We were unable to weigh the patient today for this TeleHealth visit. She feels as if she has lost weight since her last visit. She has lost 61 lbs since starting treatment with [name].  Bilateral Buttocks Pain Toni [name] notes some rough patches on her buttocks that are feeling a little achy. She is concerned that she is starting to develop pressure sores, especially since she has lost so much weight. She denies fever or chills.  ASSESSMENT AND PLAN:  Buttock pain  Class 1 obesity with serious comorbidity and body mass index (BMI) of 34.0 to 34.9 in adult, unspecified obesity type  PLAN:  Bilateral Buttocks Pain I am unable to examine Toni [name], but she has an appointment tomorrow with her PCP to evaluate. She was encouraged to move around as much as possible and continue to eat all of her meals to keep her nutrition good. Toni [name] agreed to follow up in 2 weeks as directed.  I spent > than 50% of the 25 minute visit on counseling as documented in the note.  Obesity Toni [name] is currently in the action stage of change. As such, her goal  is to continue with weight loss efforts. She has agreed to follow the Category 1 plan. Toni [name] has been instructed to work up to a goal of 150 minutes of combined cardio and strengthening exercise per week for weight loss and overall health benefits. We discussed the following Behavioral Modification Strategies today: increasing lean protein intake, increase H2O intake, and increasing vegetables.  Toni [name] has agreed to follow up with our clinic in 2 weeks. She was informed of the importance of frequent follow up visits to maximize her success with intensive lifestyle modifications for her multiple health conditions.  ALLERGIES: Allergies  Allergen Reactions  . Hydrocodone Nausea And Vomiting    Hydrocodone causes vomiting  . Oxycodone Nausea And Vomiting    Oral oxycodone causes vomiting  . Chlorhexidine Gluconate Rash    Sores at site  . Zoloft [Sertraline] Hives, Swelling and Rash    MEDICATIONS: Current Outpatient Medications on File Prior to Visit  Medication Sig Dispense Refill  . acetaminophen (TYLENOL) 500 MG tablet Take 500-1,000 mg by mouth every 6 (six) hours as needed for fever or headache (or pain).    Marland Kitchen ALPRAZolam (XANAX) 0.5 MG tablet Take 1 tablet (0.5 mg total) by mouth 2 (two) times daily as needed for anxiety. 60 tablet 0  . baclofen (LIORESAL) 20 MG tablet Once to two tablets a day for breakthrough spasms 180 tablet 0  . Biotin (PA BIOTIN) 1000 MCG tablet Take 1,000 mcg by mouth  daily.    . buPROPion (WELLBUTRIN XL) 300 MG 24 hr tablet Take 1 tablet (300 mg total) by mouth daily. 30 tablet 0  . CALCIUM CITRATE PO Take 2 tablets by mouth 2 (two) times daily.    . cetirizine (ZYRTEC) 10 MG tablet Take 10 mg by mouth daily.    . Cholecalciferol (VITAMIN D) 2000 units tablet Take 1 tablet (2,000 Units total) by mouth 2 (two) times daily.  0  . co-enzyme Q-10 30 MG capsule Take 30 mg by mouth daily.    . corticotropin (ACTHAR) 80 UNIT/ML injectable gel INJECT 80 UNITS  (1ML)  SUBCUTANEOUSLY EVERY OTHER  DAY FOR A TOTAL OF 10 DAYS. REPEAT EVERY 30 DAYS.  (DISCARD 28 DAYS AFTER 1ST  USE) 5 mL 5  . Cranberry 1000 MG CAPS Take by mouth.    . diltiazem (CARDIZEM CD) 180 MG 24 hr capsule TAKE 1 CAPSULE(180 MG) BY MOUTH DAILY 90 capsule 3  . docusate sodium (COLACE) 100 MG capsule Take 1 capsule (100 mg total) by mouth daily. 10 capsule 0  . famotidine (PEPCID) 20 MG tablet TK 1 T PO D  6  . fluticasone (FLONASE) 50 MCG/ACT nasal spray Place 2 sprays into both nostrils daily as needed for allergies.     . furosemide (LASIX) 40 MG tablet Take 80 mg by mouth daily.     Marland Kitchen gabapentin (NEURONTIN) 300 MG capsule TAKE 1 CAPSULE BY MOUTH UP  TO 4 TIMES DAILY 360 capsule 3  . lactulose, encephalopathy, (GENERLAC) 10 GM/15ML SOLN Take 15 mLs (10 g total) by mouth 2 (two) times daily as needed. 2746 mL 0  . levothyroxine (SYNTHROID, LEVOTHROID) 75 MCG tablet Take 75 mcg by mouth daily.    Marland Kitchen linaclotide (LINZESS) 145 MCG CAPS capsule Take 1 capsule (145 mcg total) by mouth daily before breakfast. 30 capsule 2  . Multiple Vitamins-Minerals (CENTRUM SILVER PO) Take 1 tablet by mouth daily.    . nebivolol (BYSTOLIC) 10 MG tablet Take 20 mg by mouth daily.     . Omega-3 Fatty Acids (FISH OIL) 1000 MG CAPS Take 1,000 mg by mouth daily.    Marland Kitchen omeprazole (PRILOSEC) 20 MG capsule Take 20 mg by mouth daily.     . psyllium (METAMUCIL SMOOTH TEXTURE) 28 % packet Take 1 packet by mouth every other day.    . simvastatin (ZOCOR) 10 MG tablet     . SYRINGE/NEEDLE, DISP, 1 ML (BD LUER-LOK SYRINGE) 25G X 5/8" 1 ML MISC Use as directed to inject  Acthar 10 each prn  . Tamsulosin HCl (FLOMAX) 0.4 MG CAPS Take 0.4 mg by mouth daily.    Marland Kitchen UNABLE TO FIND Baclofen pump: Continuous    . warfarin (COUMADIN) 5 MG tablet TAKE 1 TABLET BY MOUTH  DAILY AS DIRECTED BY  COUMADIN CLINIC 90 tablet 1   Current Facility-Administered Medications on File Prior to Visit  Medication Dose Route Frequency Provider  Last Rate Last Dose  . baclofen (LIORESAL) intrathecal injection 40 mg/21mL  40 mg Intrathecal Once Kathrynn Ducking, MD        PAST MEDICAL HISTORY: Past Medical History:  Diagnosis Date  . Abnormality of gait 03/01/2015  . Anxiety   . Arthritis    "knees" (01/19/2016)  . Atrial fibrillation with RVR ([place]) 01/19/2016  . Back pain   . Chest pain   . Childhood asthma   . Chronic pain    "nerve pain from the MS"  . Constipation   .  Depression   . DVT (deep venous thrombosis) ([place]) 1983   "RLE; may have just been phlebitis; it was before the age of dopplers"  . 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 GERD (gastroesophageal reflux disease)   . History of stress test 06/21/10   limited exam with some degree of breast attenuatin of the apex however a component of apical ischemia is not excluded, EF 62%; cardiac cath   . HLD (hyperlipidemia)   . Hx of echocardiogram 04/22/10   EF 55-60%, no valve issues  . Hypertension   . Hypothyroidism   . Joint pain   . Migraine    "none since I stopped beta blocker in 1990s" (01/19/2016)  . Multiple sclerosis ([place])    has had this may 1989-[facility] reg doc  . OSA on CPAP   . Osteoarthritis   . PAF (paroxysmal atrial fibrillation) ([place])    on coumadin; documented on monitor 06/2010  . Pneumonia 11/2011  . Shortness of breath   . Thyroid disease     PAST SURGICAL HISTORY: Past Surgical History:  Procedure Laterality Date  . ANTERIOR CERVICAL DECOMP/DISCECTOMY FUSION  01/2004  . BACK SURGERY    . CARDIAC CATHETERIZATION  06/22/2010   normal coronary arteries, PAF  . CATARACT EXTRACTION, BILATERAL Bilateral   . CHILD BIRTHS     X2  . DILATION AND CURETTAGE OF UTERUS    . INFUSION PUMP IMPLANTATION  ~ 2009   baclofen infusion in lower abd  . PAIN PUMP REVISION N/A 07/29/2014   Procedure: Baclofen pump replacement;  Surgeon: Erline Levine, MD;  Location: [facility];  Service: Neurosurgery;   Laterality: N/A;  Baclofen pump replacement  . TONSILLECTOMY AND ADENOIDECTOMY    . VARICOSE VEIN SURGERY  ~ 1968  . VENOUS ABLATION  12/16/2010   radiofreq ablation -Dr Elisabeth Cara and [facility]  . WISDOM TOOTH EXTRACTION      SOCIAL HISTORY: Social History   Tobacco Use  . Smoking status: Former Smoker    Packs/day: 0.50    Years: 7.00    Pack years: 3.50    Types: Cigarettes    Last attempt to quit: 08/21/1976    Years since quitting: 42.3  . Smokeless tobacco: Never Used  Substance Use Topics  . Alcohol use: Yes    Alcohol/week: 1.0 standard drinks    Types: 1 Glasses of wine per week  . Drug use: No    FAMILY HISTORY: Family History  Problem Relation Age of Onset  . Coronary artery disease Father        at age 62  . Hyperlipidemia Father   . Thyroid disease Father   . Coronary artery disease Maternal Grandmother   . Depression Maternal Grandmother   . Cancer Paternal Grandmother   . Depression Mother   . Sudden death Mother   . Anxiety disorder Mother   . Alcoholism Son     ROS: Review of Systems  Constitutional: Negative for chills and fever.  Musculoskeletal:       Positive for buttocks pain.    PHYSICAL EXAM: Pt in no acute distress  RECENT LABS AND TESTS: BMET    Component Value Date/Time   NA 143 09/11/2018 1317   K 4.4 09/11/2018 1317   CL 106 09/11/2018 1317   CO2 23 09/11/2018 1317   GLUCOSE 114 (H) 09/11/2018 1317   GLUCOSE 92 07/08/2017 1214   BUN 25  09/11/2018 1317   CREATININE 0.68 09/11/2018 1317   CALCIUM 9.7 09/11/2018 1317   GFRNONAA 88 09/11/2018 1317   GFRAA 102 09/11/2018 1317   Lab Results  Component Value Date   HGBA1C 5.3 09/11/2018   HGBA1C 5.3 04/23/2018   HGBA1C 5.2 10/01/2017   HGBA1C 5.4 03/08/2017   HGBA1C  04/21/2010    5.5 (NOTE)                                                                       According to the ADA Clinical Practice Recommendations for 2011, when HbA1c is used as a screening test:   >=6.5%    Diagnostic of Diabetes Mellitus           (if abnormal result  is confirmed)  5.7-6.4%   Increased risk of developing Diabetes Mellitus  References:Diagnosis and Classification of Diabetes Mellitus,Diabetes NWGN,5621,30(QMVHQ 1):S62-S69 and Standards of Medical Care in         Diabetes - 2011,Diabetes Care,2011,34  (Suppl 1):S11-S61.   Lab Results  Component Value Date   INSULIN 13.0 09/11/2018   INSULIN 7.8 04/23/2018   INSULIN 20.4 11/27/2017   INSULIN 20.4 03/08/2017   CBC    Component Value Date/Time   WBC 8.9 07/11/2018   WBC 8.4 07/08/2017 1214   RBC 4.83 07/08/2017 1214   HGB 14.6 07/11/2018   HGB 14.1 03/08/2017 1152   HCT 45 07/11/2018   HCT 43.0 03/08/2017 1152   PLT 185 10/01/2017   PLT 219 12/18/2014 1157   MCV 93.2 07/08/2017 1214   MCV 93 03/08/2017 1152   MCH 31.5 07/08/2017 1214   MCHC 33.8 07/08/2017 1214   RDW 13.3 07/08/2017 1214   RDW 13.3 03/08/2017 1152   LYMPHSABS 1.6 07/08/2017 1214   LYMPHSABS 2.2 03/08/2017 1152   MONOABS 1.3 (H) 07/08/2017 1214   EOSABS 0.2 07/08/2017 1214   EOSABS 0.1 03/08/2017 1152   BASOSABS 0.0 07/08/2017 1214   BASOSABS 0.0 03/08/2017 1152   Iron/TIBC/Ferritin/ %Sat No results found for: IRON, TIBC, FERRITIN, IRONPCTSAT Lipid Panel     Component Value Date/Time   CHOL 147 07/11/2018   CHOL 129 11/27/2017 1011   TRIG 56 07/11/2018   HDL 53 07/11/2018   HDL 51 11/27/2017 1011   CHOLHDL 3.0 04/22/2010 0536   VLDL 29 04/22/2010 0536   LDLCALC 83 07/11/2018   LDLCALC 66 11/27/2017 1011   Hepatic Function Panel     Component Value Date/Time   PROT 6.6 09/11/2018 1317   ALBUMIN 4.6 09/11/2018 1317   AST 20 09/11/2018 1317   ALT 33 (H) 09/11/2018 1317   ALKPHOS 62 09/11/2018 1317   BILITOT 0.3 09/11/2018 1317      Component Value Date/Time   TSH 0.651 11/27/2017 1011   TSH 2.410 03/08/2017 1152   TSH 1.083 08/06/2016 0647   TSH 1.871 01/19/2016 2302   TSH 0.206 (L) 03/10/2011 1351   Results for Greenfeld,  Analeia G "[name]" (MRN [id number]) as of 01/02/2019 08:19  Ref. Range 09/11/2018 13:17  Vitamin D, 25-Hydroxy Latest Ref Range: 30.0 - 100.0 ng/mL 57.5     I, Marcille Blanco, CMA, am acting as transcriptionist for Starlyn Skeans, MD I have reviewed the above documentation for accuracy  and completeness, and I agree with the above. -Dennard Nip, MD

## 2019-01-03 DIAGNOSIS — I4891 Unspecified atrial fibrillation: Secondary | ICD-10-CM | POA: Diagnosis not present

## 2019-01-03 DIAGNOSIS — R21 Rash and other nonspecific skin eruption: Secondary | ICD-10-CM | POA: Diagnosis not present

## 2019-01-03 DIAGNOSIS — Z7901 Long term (current) use of anticoagulants: Secondary | ICD-10-CM | POA: Diagnosis not present

## 2019-01-03 DIAGNOSIS — I10 Essential (primary) hypertension: Secondary | ICD-10-CM | POA: Diagnosis not present

## 2019-01-03 DIAGNOSIS — G35 Multiple sclerosis: Secondary | ICD-10-CM | POA: Diagnosis not present

## 2019-01-06 ENCOUNTER — Telehealth: Payer: Self-pay | Admitting: Neurology

## 2019-01-06 MED ORDER — ALPRAZOLAM 0.5 MG PO TABS: 0.5000 mg | ORAL_TABLET | Freq: Every evening | ORAL | 0 refills | Status: DC | PRN

## 2019-01-06 NOTE — Telephone Encounter (Signed)
Called the patient back advised the Dr Brett Fairy reviewed her sleep report on the current BiPAP settings. Informed her that where she currently is a good setting. She is about 50/50 with central vs obstructive apnea and afraid if we increase the pressure it may actually cause the central apneas to worsen. Pt states she is waking up every 2 hrs and unable to get a solid sleep and thought it ws related to that. Informed her I would ask Dr Dohmeier her thoughts on helping her get some rest since the 5 mg melatonin is not working. Dr Dohmeier states pt can use the xanax 0.5 mg tablet that she has to help take at bedtime as she needs to, advised the patient to only use as needed and if she feels this she is having to use it more frequently to reach out and let us know and we can discuss taking a different medication like trazadone since she has never tried that. Pt verbalized understanding and was appreciative. At this time the patient has a full bottle of xanax 90 tablet. She states that she is ok and doesn't need any called in. I informed her I would have this documented that Dr Brett Fairy was ok with the patient using it QHS PRN. Pt verbalized understanding.

## 2019-01-06 NOTE — Telephone Encounter (Signed)
Pt called in and stated she feels she needs more pressure on her BiPap machine  She wants to know if she can get new settings

## 2019-01-07 DIAGNOSIS — G35 Multiple sclerosis: Secondary | ICD-10-CM | POA: Diagnosis not present

## 2019-01-07 DIAGNOSIS — Z7901 Long term (current) use of anticoagulants: Secondary | ICD-10-CM | POA: Diagnosis not present

## 2019-01-07 DIAGNOSIS — I10 Essential (primary) hypertension: Secondary | ICD-10-CM | POA: Diagnosis not present

## 2019-01-07 DIAGNOSIS — R21 Rash and other nonspecific skin eruption: Secondary | ICD-10-CM | POA: Diagnosis not present

## 2019-01-07 DIAGNOSIS — I4891 Unspecified atrial fibrillation: Secondary | ICD-10-CM | POA: Diagnosis not present

## 2019-01-08 DIAGNOSIS — I4891 Unspecified atrial fibrillation: Secondary | ICD-10-CM | POA: Diagnosis not present

## 2019-01-08 DIAGNOSIS — R21 Rash and other nonspecific skin eruption: Secondary | ICD-10-CM | POA: Diagnosis not present

## 2019-01-08 DIAGNOSIS — Z7901 Long term (current) use of anticoagulants: Secondary | ICD-10-CM | POA: Diagnosis not present

## 2019-01-08 DIAGNOSIS — G35 Multiple sclerosis: Secondary | ICD-10-CM | POA: Diagnosis not present

## 2019-01-08 DIAGNOSIS — I10 Essential (primary) hypertension: Secondary | ICD-10-CM | POA: Diagnosis not present

## 2019-01-09 ENCOUNTER — Ambulatory Visit (INDEPENDENT_AMBULATORY_CARE_PROVIDER_SITE_OTHER): Payer: Medicare Other

## 2019-01-09 ENCOUNTER — Encounter: Payer: Self-pay | Admitting: Orthopaedic Surgery

## 2019-01-09 ENCOUNTER — Ambulatory Visit (INDEPENDENT_AMBULATORY_CARE_PROVIDER_SITE_OTHER): Payer: Medicare Other | Admitting: Orthopaedic Surgery

## 2019-01-09 ENCOUNTER — Other Ambulatory Visit: Payer: Self-pay

## 2019-01-09 DIAGNOSIS — M25532 Pain in left wrist: Secondary | ICD-10-CM

## 2019-01-09 DIAGNOSIS — M1712 Unilateral primary osteoarthritis, left knee: Secondary | ICD-10-CM

## 2019-01-09 DIAGNOSIS — M25562 Pain in left knee: Secondary | ICD-10-CM | POA: Diagnosis not present

## 2019-01-09 DIAGNOSIS — G8929 Other chronic pain: Secondary | ICD-10-CM | POA: Diagnosis not present

## 2019-01-09 DIAGNOSIS — G5602 Carpal tunnel syndrome, left upper limb: Secondary | ICD-10-CM

## 2019-01-09 MED ORDER — METHYLPREDNISOLONE ACETATE 40 MG/ML IJ SUSP
40.0000 mg | INTRAMUSCULAR | Status: AC | PRN
  Administered 2019-01-09: 40 mg via INTRA_ARTICULAR

## 2019-01-09 MED ORDER — METHYLPREDNISOLONE ACETATE 40 MG/ML IJ SUSP
40.0000 mg | INTRAMUSCULAR | Status: AC | PRN
  Administered 2019-01-09: 40 mg

## 2019-01-09 MED ORDER — LIDOCAINE HCL 1 % IJ SOLN
1.0000 mL | INTRAMUSCULAR | Status: AC | PRN
  Administered 2019-01-09: 16:00:00 1 mL

## 2019-01-09 MED ORDER — LIDOCAINE HCL 1 % IJ SOLN
3.0000 mL | INTRAMUSCULAR | Status: AC | PRN
  Administered 2019-01-09: 3 mL

## 2019-01-09 NOTE — Progress Notes (Signed)
-   Office Visit Note   Patient: Toni [name]           Date of Birth: 1945/10/22           MRN: [id number] Visit Date: 01/09/2019              Requested by: Prince Solian, MD [address] [place], [place] [address] PCP: Prince Solian, MD   Assessment & Plan: Visit Diagnoses:  1. Chronic pain of left knee   2. Pain in left wrist   3. Carpal tunnel syndrome, left upper limb   4. Unilateral primary osteoarthritis, left knee     Plan: Hopefully therapy can continue to help with her mobility.  I agree with her having a steroid injection in the transverse carpal ligament on the left side and the left knee.  She is not interested in any type of surgical intervention.  She tolerated the injections well.  She understands the issues with her musculoskeletal symptoms and diagnoses.  There is nothing else that we can offer other than occasional injection.  Follow-Up Instructions: Return if symptoms worsen or fail to improve.   Orders:  Orders Placed This Encounter  Procedures  . Large Joint Inj  . Hand/UE Inj  . XR Wrist Complete Left  . XR KNEE 3 VIEW LEFT   No orders of the defined types were placed in this encounter.     Procedures: Large Joint Inj: L knee on 01/09/2019 3:57 PM Indications: diagnostic evaluation and pain Details: 22 G 1.5 in needle, superolateral approach  Arthrogram: No  Medications: 3 mL lidocaine 1 %; 40 mg methylPREDNISolone acetate 40 MG/ML Outcome: tolerated well, no immediate complications Procedure, treatment alternatives, risks and benefits explained, specific risks discussed. Consent was given by the patient. Immediately prior to procedure a time out was called to verify the correct patient, procedure, equipment, support staff and site/side marked as required. Patient was prepped and draped in the usual sterile fashion.   Hand/UE Inj: L carpal tunnel for carpal tunnel syndrome on 01/09/2019 3:58 PM Medications: 1 mL lidocaine 1 %; 40 mg  methylPREDNISolone acetate 40 MG/ML      Clinical Data: No additional findings.   Subjective: Chief Complaint  Patient presents with  . Left Wrist - Pain  . Left Knee - Pain  The patient is well-known to me.  She has significant multiple sclerosis and is also on blood thinning medication.  She is well-known severe osteoarthritis and degenerative disease of the left knee as well as bilateral carpal tunnel syndrome.  She is gotten to where her left wrist hurts her and she has numbness and tingling left hand as well as weakness.  She has had steroid injections in the past and over the transverse carpal ligament area of both wrists.  Her left knee is hurting her quite a bit as well.  Her main mobility is in a wheelchair.  Her mobility is also significantly limited.  She works with physical therapy to work on her balance and strengthening as well as dexterity and coordination.  She is hoping to have a steroid injection in her left wrist today as well as her left knee.  She has had no other acute changes in her medical status.  HPI  Review of Systems She currently denies any headache, chest pain, shortness of breath, fever, chills, nausea, vomiting  Objective: Vital Signs: There were no vitals taken for this visit.  Physical Exam She is alert and orient x3 and in  no acute distress Ortho Exam Examination of her left hand and wrist shows significant wasting of the muscles of her left hand and wrist with significant weakness with pinch and grip strength.  She does have arthritic changes in the IP joints of her fingers.  She cannot fully extend her left knee due to a slight flexion contracture.  There is significant patellofemoral crepitation and significant medial and lateral joint line tenderness. Specialty Comments:  No specialty comments available.  Imaging: Xr Wrist Complete Left  Result Date: 01/09/2019 2 views of the left wrist show no acute findings.  There is chronic arthritic  changes in the wrist joint itself.  There is ulnar positive variance and it almost appears as if there is an old Madelungs deformity  Xr Knee 3 View Left  Result Date: 01/09/2019 3 views left knee show severe tricompartment arthritic changes with large para-articular osteophytes in all 3 compartments and severe joint space narrowing.  There is otherwise no acute findings.    PMFS History: Patient Active Problem List   Diagnosis Date Noted  . Unilateral primary osteoarthritis, left knee 01/09/2019  . Type 2 diabetes mellitus without complication, without long-term current use of insulin ([place]) 08/27/2018  . Obstructive sleep apnea treated with bilevel positive airway pressure (BiPAP) 08/15/2018  . Sleep apnea treated with nocturnal BiPAP 04/23/2018  . 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 Permanent atrial fibrillation 11/21/2017  . Tightness of leg fascia 11/21/2017  . Muscle spasm of both lower legs 11/21/2017  . Carpal tunnel syndrome, left upper limb 09/13/2017  . Carpal tunnel syndrome, right upper limb 09/13/2017  . Other insomnia 06/11/2017  . Depression 06/11/2017  . Abdominal swelling 03/29/2017  . Central apnea 03/26/2017  . Class 3 obesity with serious comorbidity and body mass index (BMI) of 40.0 to 44.9 in adult 03/22/2017  . Vitamin D deficiency 03/22/2017  . Prediabetes 03/22/2017  . Venous insufficiency 02/14/2017  . Right hand weakness 08/06/2016  . Nonischemic cardiomyopathy ([place]) 05/09/2016  . Localized edema 05/09/2016  . Long term current use of anticoagulant therapy 02/03/2016  . 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 Atrial fibrillation with RVR ([place]) 01/19/2016  . 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 Obesity hypoventilation syndrome ([place]) 03/30/2015  . Abnormality of gait 03/01/2015  . Chronic constipation with overflow incontinence 01/25/2015  .  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 MS (multiple sclerosis) ([place]) 07/29/2014  . Closed right ankle fracture 10/30/2013  . Leucocytosis 10/30/2013  . Varicose veins of both lower extremities with complications 81/44/8185  . OSA on CPAP 06/04/2013  . Muscle spasms of lower extremity 02/24/2013  . Long term (current) use of anticoagulants 11/05/2012  . 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 Aspiration pneumonia vs Failed outpatient Rx of CAP 11/26/2011  . Essential hypertension   . Thyroid disease   . HLD (hyperlipidemia)    Past Medical History:  Diagnosis Date  . Abnormality of gait 03/01/2015  . Anxiety   . Arthritis    "knees" (01/19/2016)  . Atrial fibrillation with RVR ([place]) 01/19/2016  . Back pain   . Chest pain   . Childhood asthma   . Chronic pain    "nerve pain from the MS"  . Constipation   . Depression   .  DVT (deep venous thrombosis) ([place]) 1983   "RLE; may have just been phlebitis; it was before the age of dopplers"  . 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 GERD (gastroesophageal reflux disease)   . History of stress test 06/21/10   limited exam with some degree of breast attenuatin of the apex however a component of apical ischemia is not excluded, EF 62%; cardiac cath   . HLD (hyperlipidemia)   . Hx of echocardiogram 04/22/10   EF 55-60%, no valve issues  . Hypertension   . Hypothyroidism   . Joint pain   . Migraine    "none since I stopped beta blocker in 1990s" (01/19/2016)  . Multiple sclerosis ([place])    has had this may 1989-[facility]meir reg doc  . OSA on CPAP   . Osteoarthritis   . PAF (paroxysmal atrial fibrillation) ([place])    on coumadin; documented on monitor 06/2010  . Pneumonia 11/2011  . Shortness of breath   .  Thyroid disease     Family History  Problem Relation Age of Onset  . Coronary artery disease Father        at age 40  . Hyperlipidemia Father   . Thyroid disease Father   . Coronary artery disease Maternal Grandmother   . Depression Maternal Grandmother   . Cancer Paternal Grandmother   . Depression Mother   . Sudden death Mother   . Anxiety disorder Mother   . Alcoholism Son     Past Surgical History:  Procedure Laterality Date  . ANTERIOR CERVICAL DECOMP/DISCECTOMY FUSION  01/2004  . BACK SURGERY    . CARDIAC CATHETERIZATION  06/22/2010   normal coronary arteries, PAF  . CATARACT EXTRACTION, BILATERAL Bilateral   . CHILD BIRTHS     X2  . DILATION AND CURETTAGE OF UTERUS    . INFUSION PUMP IMPLANTATION  ~ 2009   baclofen infusion in lower abd  . PAIN PUMP REVISION N/A 07/29/2014   Procedure: Baclofen pump replacement;  Surgeon: Erline Levine, MD;  Location: [facility];  Service: Neurosurgery;  Laterality: N/A;  Baclofen pump replacement  . TONSILLECTOMY AND ADENOIDECTOMY    . VARICOSE VEIN SURGERY  ~ 1968  . VENOUS ABLATION  12/16/2010   radiofreq ablation -Dr Elisabeth Cara and [facility]  . WISDOM TOOTH EXTRACTION     Social History   Occupational History  . Occupation: RETIRED  Tobacco Use  . Smoking status: Former Smoker    Packs/day: 0.50    Years: 7.00    Pack years: 3.50    Types: Cigarettes    Last attempt to quit: 08/21/1976    Years since quitting: 42.4  . Smokeless tobacco: Never Used  Substance and Sexual Activity  . Alcohol use: Yes    Alcohol/week: 1.0 standard drinks    Types: 1 Glasses of wine per week  . Drug use: No  . Sexual activity: Not Currently

## 2019-01-10 ENCOUNTER — Telehealth: Payer: Self-pay

## 2019-01-10 NOTE — Telephone Encounter (Signed)
Pt to have [facility] call on Wednesday for INR orders

## 2019-01-10 NOTE — Telephone Encounter (Signed)
Called patient to screen for covid for coumadin check pt informed me that they are having home health and would like orders for them to draw it. [facility]. I will route to kristin or raquel the pharmd to get orders done.

## 2019-01-11 ENCOUNTER — Other Ambulatory Visit: Payer: Self-pay | Admitting: Internal Medicine

## 2019-01-14 ENCOUNTER — Other Ambulatory Visit: Payer: Self-pay | Admitting: Gastroenterology

## 2019-01-14 DIAGNOSIS — N312 Flaccid neuropathic bladder, not elsewhere classified: Secondary | ICD-10-CM | POA: Diagnosis not present

## 2019-01-14 DIAGNOSIS — D49512 Neoplasm of unspecified behavior of left kidney: Secondary | ICD-10-CM | POA: Diagnosis not present

## 2019-01-15 ENCOUNTER — Ambulatory Visit (INDEPENDENT_AMBULATORY_CARE_PROVIDER_SITE_OTHER): Payer: Medicare Other | Admitting: Pharmacist Clinician (PhC)/ Clinical Pharmacy Specialist

## 2019-01-15 DIAGNOSIS — G35 Multiple sclerosis: Secondary | ICD-10-CM | POA: Diagnosis not present

## 2019-01-15 DIAGNOSIS — I4891 Unspecified atrial fibrillation: Secondary | ICD-10-CM

## 2019-01-15 DIAGNOSIS — R21 Rash and other nonspecific skin eruption: Secondary | ICD-10-CM | POA: Diagnosis not present

## 2019-01-15 DIAGNOSIS — Z7901 Long term (current) use of anticoagulants: Secondary | ICD-10-CM

## 2019-01-15 DIAGNOSIS — I10 Essential (primary) hypertension: Secondary | ICD-10-CM | POA: Diagnosis not present

## 2019-01-15 LAB — POCT INR: INR: 2.9 (ref 2.0–3.0)

## 2019-01-16 ENCOUNTER — Encounter (INDEPENDENT_AMBULATORY_CARE_PROVIDER_SITE_OTHER): Payer: Self-pay | Admitting: Family Medicine

## 2019-01-16 ENCOUNTER — Ambulatory Visit (INDEPENDENT_AMBULATORY_CARE_PROVIDER_SITE_OTHER): Payer: Medicare Other | Admitting: Family Medicine

## 2019-01-16 ENCOUNTER — Other Ambulatory Visit: Payer: Self-pay

## 2019-01-16 DIAGNOSIS — Z6834 Body mass index (BMI) 34.0-34.9, adult: Secondary | ICD-10-CM

## 2019-01-16 DIAGNOSIS — R21 Rash and other nonspecific skin eruption: Secondary | ICD-10-CM | POA: Diagnosis not present

## 2019-01-16 DIAGNOSIS — E669 Obesity, unspecified: Secondary | ICD-10-CM

## 2019-01-16 DIAGNOSIS — G35 Multiple sclerosis: Secondary | ICD-10-CM | POA: Diagnosis not present

## 2019-01-16 DIAGNOSIS — I4891 Unspecified atrial fibrillation: Secondary | ICD-10-CM | POA: Diagnosis not present

## 2019-01-16 DIAGNOSIS — Z7901 Long term (current) use of anticoagulants: Secondary | ICD-10-CM | POA: Diagnosis not present

## 2019-01-16 DIAGNOSIS — I10 Essential (primary) hypertension: Secondary | ICD-10-CM | POA: Diagnosis not present

## 2019-01-16 NOTE — Progress Notes (Signed)
Office: [phone]  /  Fax: [phone] TeleHealth Visit:  Toni [name] has verbally consented to this TeleHealth visit today. The patient is located at home, the provider is located at the [facility] office. The participants in this visit include the listed provider and patient. The visit was conducted today via [email or web address].  HPI:   Chief Complaint: OBESITY Toni [name] is here to discuss her progress with her obesity treatment plan. She is on the Category 1 plan and is following her eating plan approximately 90 to 95 % of the time. She states she is doing physical therapy 20 to 45 minutes 2 to 5 times per week. Toni [name] continues to work on her diet and weight loss efforts. She thinks that she is still losing, but weighing herself is difficult with her multiple sclerosis.  We were unable to weigh the patient today for this TeleHealth visit. She feels as if she has lost weight since her last visit. She has lost 61 lbs since starting treatment with [name].  Hypertension Toni [name] is a 73 y.o. female with hypertension. Toni [name]'s blood pressure continues to be well controlled with diet and weight loss with the goal of decreasing her risk of heart attack and stroke. She is trying to stay well hydrated. Toni [name] denies chest pain, headache, or dizziness.  ASSESSMENT AND PLAN:  Essential hypertension  Class 1 obesity with serious comorbidity and body mass index (BMI) of 34.0 to 34.9 in adult, unspecified obesity type  PLAN:  Hypertension We discussed sodium restriction, working on healthy weight loss, and a regular exercise program as the means to achieve improved blood pressure control. We will continue to monitor her blood pressure as well as her progress with the above lifestyle modifications. She will continue her diet, exercise, weight loss, and medications as prescribed. She will watch for signs of hypotension as she continues her lifestyle modifications. We will  recheck labs in the office as soon as it is safe for her to come in. Toni [name] agreed with this plan and agreed to follow up as directed in 2 weeks.  Obesity Toni [name] is currently in the action stage of change. As such, her goal is to continue with weight loss efforts. She has agreed to follow the Category 1 plan. Toni [name] has been instructed to work up to a goal of 150 minutes of combined cardio and strengthening exercise per week for weight loss and overall health benefits. We discussed the following Behavioral Modification Strategies today: increasing lean protein intake, decreasing simple carbohydrates, better snacking choices, and work on meal planning and easy cooking plans.  Toni [name] has agreed to follow up with our clinic in 2 weeks. She was informed of the importance of frequent follow up visits to maximize her success with intensive lifestyle modifications for her multiple health conditions.  ALLERGIES: Allergies  Allergen Reactions  . Hydrocodone Nausea And Vomiting    Hydrocodone causes vomiting  . Oxycodone Nausea And Vomiting    Oral oxycodone causes vomiting  . Chlorhexidine Gluconate Rash    Sores at site  . Zoloft [Sertraline] Hives, Swelling and Rash    MEDICATIONS: Current Outpatient Medications on File Prior to Visit  Medication Sig Dispense Refill  . acetaminophen (TYLENOL) 500 MG tablet Take 500-1,000 mg by mouth every 6 (six) hours as needed for fever or headache (or pain).    Marland Kitchen ALPRAZolam (XANAX) 0.5 MG tablet Take 1 tablet (0.5 mg total) by mouth at bedtime as needed for anxiety or sleep. 90 tablet  0  . baclofen (LIORESAL) 20 MG tablet Once to two tablets a day for breakthrough spasms 180 tablet 0  . Biotin (PA BIOTIN) 1000 MCG tablet Take 1,000 mcg by mouth daily.    Marland Kitchen buPROPion (WELLBUTRIN XL) 300 MG 24 hr tablet Take 1 tablet (300 mg total) by mouth daily. 30 tablet 0  . CALCIUM CITRATE PO Take 2 tablets by mouth 2 (two) times daily.    . cetirizine  (ZYRTEC) 10 MG tablet Take 10 mg by mouth daily.    . Cholecalciferol (VITAMIN D) 2000 units tablet Take 1 tablet (2,000 Units total) by mouth 2 (two) times daily.  0  . co-enzyme Q-10 30 MG capsule Take 30 mg by mouth daily.    . corticotropin (ACTHAR) 80 UNIT/ML injectable gel INJECT 80 UNITS (1ML)  SUBCUTANEOUSLY EVERY OTHER  DAY FOR A TOTAL OF 10 DAYS. REPEAT EVERY 30 DAYS.  (DISCARD 28 DAYS AFTER 1ST  USE) 5 mL 5  . Cranberry 1000 MG CAPS Take by mouth.    . diltiazem (CARDIZEM CD) 180 MG 24 hr capsule TAKE 1 CAPSULE BY MOUTH  DAILY 90 capsule 3  . docusate sodium (COLACE) 100 MG capsule Take 1 capsule (100 mg total) by mouth daily. 10 capsule 0  . famotidine (PEPCID) 20 MG tablet TK 1 T PO D  6  . fluticasone (FLONASE) 50 MCG/ACT nasal spray Place 2 sprays into both nostrils daily as needed for allergies.     . furosemide (LASIX) 40 MG tablet Take 80 mg by mouth daily.     Marland Kitchen gabapentin (NEURONTIN) 300 MG capsule TAKE 1 CAPSULE BY MOUTH UP  TO 4 TIMES DAILY 360 capsule 3  . lactulose, encephalopathy, (GENERLAC) 10 GM/15ML SOLN Take 15 mLs (10 [name] total) by mouth 2 (two) times daily as needed. 2746 mL 0  . levothyroxine (SYNTHROID, LEVOTHROID) 75 MCG tablet Take 75 mcg by mouth daily.    Marland Kitchen LINZESS 145 MCG CAPS capsule TAKE 1 CAPSULE(145 MCG) BY MOUTH DAILY BEFORE BREAKFAST 30 capsule 2  . Multiple Vitamins-Minerals (CENTRUM SILVER PO) Take 1 tablet by mouth daily.    . nebivolol (BYSTOLIC) 10 MG tablet Take 20 mg by mouth daily.     . Omega-3 Fatty Acids (FISH OIL) 1000 MG CAPS Take 1,000 mg by mouth daily.    Marland Kitchen omeprazole (PRILOSEC) 20 MG capsule Take 20 mg by mouth daily.     . psyllium (METAMUCIL SMOOTH TEXTURE) 28 % packet Take 1 packet by mouth every other day.    . simvastatin (ZOCOR) 10 MG tablet     . SYRINGE/NEEDLE, DISP, 1 ML (BD LUER-LOK SYRINGE) 25G X 5/8" 1 ML MISC Use as directed to inject  Acthar 10 each prn  . Tamsulosin HCl (FLOMAX) 0.4 MG CAPS Take 0.4 mg by mouth daily.     Marland Kitchen UNABLE TO FIND Baclofen pump: Continuous    . warfarin (COUMADIN) 5 MG tablet TAKE 1 TABLET BY MOUTH  DAILY AS DIRECTED BY  COUMADIN CLINIC 90 tablet 1   Current Facility-Administered Medications on File Prior to Visit  Medication Dose Route Frequency Provider Last Rate Last Dose  . baclofen (LIORESAL) intrathecal injection 40 mg/68mL  40 mg Intrathecal Once Kathrynn Ducking, MD        PAST MEDICAL HISTORY: Past Medical History:  Diagnosis Date  . Abnormality of gait 03/01/2015  . Anxiety   . Arthritis    "knees" (01/19/2016)  . Atrial fibrillation with RVR ([place]) 01/19/2016  .  Back pain   . Chest pain   . Childhood asthma   . Chronic pain    "nerve pain from the MS"  . Constipation   . Depression   . DVT (deep venous thrombosis) ([place]) 1983   "RLE; may have just been phlebitis; it was before the age of dopplers"  . 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 GERD (gastroesophageal reflux disease)   . History of stress test 06/21/10   limited exam with some degree of breast attenuatin of the apex however a component of apical ischemia is not excluded, EF 62%; cardiac cath   . HLD (hyperlipidemia)   . Hx of echocardiogram 04/22/10   EF 55-60%, no valve issues  . Hypertension   . Hypothyroidism   . Joint pain   . Migraine    "none since I stopped beta blocker in 1990s" (01/19/2016)  . Multiple sclerosis ([place])    has had this may 1989-[facility] reg doc  . OSA on CPAP   . Osteoarthritis   . PAF (paroxysmal atrial fibrillation) ([place])    on coumadin; documented on monitor 06/2010  . Pneumonia 11/2011  . Shortness of breath   . Thyroid disease     PAST SURGICAL HISTORY: Past Surgical History:  Procedure Laterality Date  . ANTERIOR CERVICAL DECOMP/DISCECTOMY FUSION  01/2004  . BACK SURGERY    . CARDIAC CATHETERIZATION  06/22/2010   normal coronary arteries, PAF  . CATARACT EXTRACTION, BILATERAL Bilateral   . CHILD BIRTHS     X2   . DILATION AND CURETTAGE OF UTERUS    . INFUSION PUMP IMPLANTATION  ~ 2009   baclofen infusion in lower abd  . PAIN PUMP REVISION N/A 07/29/2014   Procedure: Baclofen pump replacement;  Surgeon: Erline Levine, MD;  Location: [facility];  Service: Neurosurgery;  Laterality: N/A;  Baclofen pump replacement  . TONSILLECTOMY AND ADENOIDECTOMY    . VARICOSE VEIN SURGERY  ~ 1968  . VENOUS ABLATION  12/16/2010   radiofreq ablation -Dr Elisabeth Cara and [facility]  . WISDOM TOOTH EXTRACTION      SOCIAL HISTORY: Social History   Tobacco Use  . Smoking status: Former Smoker    Packs/day: 0.50    Years: 7.00    Pack years: 3.50    Types: Cigarettes    Last attempt to quit: 08/21/1976    Years since quitting: 42.4  . Smokeless tobacco: Never Used  Substance Use Topics  . Alcohol use: Yes    Alcohol/week: 1.0 standard drinks    Types: 1 Glasses of wine per week  . Drug use: No    FAMILY HISTORY: Family History  Problem Relation Age of Onset  . Coronary artery disease Father        at age 2  . Hyperlipidemia Father   . Thyroid disease Father   . Coronary artery disease Maternal Grandmother   . Depression Maternal Grandmother   . Cancer Paternal Grandmother   . Depression Mother   . Sudden death Mother   . Anxiety disorder Mother   . Alcoholism Son     ROS: Review of Systems  Cardiovascular: Negative for chest pain.  Neurological: Negative for dizziness and headaches.    PHYSICAL EXAM: Pt in no acute distress  RECENT LABS AND TESTS: BMET    Component Value Date/Time   NA 143 09/11/2018 1317   K 4.4 09/11/2018 1317   CL 106  09/11/2018 1317   CO2 23 09/11/2018 1317   GLUCOSE 114 (H) 09/11/2018 1317   GLUCOSE 92 07/08/2017 1214   BUN 25 09/11/2018 1317   CREATININE 0.68 09/11/2018 1317   CALCIUM 9.7 09/11/2018 1317   GFRNONAA 88 09/11/2018 1317   GFRAA 102 09/11/2018 1317   Lab Results  Component Value Date   HGBA1C 5.3 09/11/2018   HGBA1C 5.3 04/23/2018   HGBA1C 5.2  10/01/2017   HGBA1C 5.4 03/08/2017   HGBA1C  04/21/2010    5.5 (NOTE)                                                                       According to the ADA Clinical Practice Recommendations for 2011, when HbA1c is used as a screening test:   >=6.5%   Diagnostic of Diabetes Mellitus           (if abnormal result  is confirmed)  5.7-6.4%   Increased risk of developing Diabetes Mellitus  References:Diagnosis and Classification of Diabetes Mellitus,Diabetes BZJI,9678,93(YBOFB 1):S62-S69 and Standards of Medical Care in         Diabetes - 2011,Diabetes Care,2011,34  (Suppl 1):S11-S61.   Lab Results  Component Value Date   INSULIN 13.0 09/11/2018   INSULIN 7.8 04/23/2018   INSULIN 20.4 11/27/2017   INSULIN 20.4 03/08/2017   CBC    Component Value Date/Time   WBC 8.9 07/11/2018   WBC 8.4 07/08/2017 1214   RBC 4.83 07/08/2017 1214   HGB 14.6 07/11/2018   HGB 14.1 03/08/2017 1152   HCT 45 07/11/2018   HCT 43.0 03/08/2017 1152   PLT 185 10/01/2017   PLT 219 12/18/2014 1157   MCV 93.2 07/08/2017 1214   MCV 93 03/08/2017 1152   MCH 31.5 07/08/2017 1214   MCHC 33.8 07/08/2017 1214   RDW 13.3 07/08/2017 1214   RDW 13.3 03/08/2017 1152   LYMPHSABS 1.6 07/08/2017 1214   LYMPHSABS 2.2 03/08/2017 1152   MONOABS 1.3 (H) 07/08/2017 1214   EOSABS 0.2 07/08/2017 1214   EOSABS 0.1 03/08/2017 1152   BASOSABS 0.0 07/08/2017 1214   BASOSABS 0.0 03/08/2017 1152   Iron/TIBC/Ferritin/ %Sat No results found for: IRON, TIBC, FERRITIN, IRONPCTSAT Lipid Panel     Component Value Date/Time   CHOL 147 07/11/2018   CHOL 129 11/27/2017 1011   TRIG 56 07/11/2018   HDL 53 07/11/2018   HDL 51 11/27/2017 1011   CHOLHDL 3.0 04/22/2010 0536   VLDL 29 04/22/2010 0536   LDLCALC 83 07/11/2018   LDLCALC 66 11/27/2017 1011   Hepatic Function Panel     Component Value Date/Time   PROT 6.6 09/11/2018 1317   ALBUMIN 4.6 09/11/2018 1317   AST 20 09/11/2018 1317   ALT 33 (H) 09/11/2018 1317   ALKPHOS  62 09/11/2018 1317   BILITOT 0.3 09/11/2018 1317      Component Value Date/Time   TSH 0.651 11/27/2017 1011   TSH 2.410 03/08/2017 1152   TSH 1.083 08/06/2016 0647   TSH 1.871 01/19/2016 2302   TSH 0.206 (L) 03/10/2011 1351   Results for Toni [name], Toni [name] "Toni [name]" (MRN [id number]) as of 01/16/2019 15:13  Ref. Range 09/11/2018 13:17  Vitamin D, 25-Hydroxy Latest Ref Range: 30.0 - 100.0 ng/mL 57.5  IMarcille Blanco, CMA, am acting as transcriptionist for Starlyn Skeans, MD I have reviewed the above documentation for accuracy and completeness, and I agree with the above. -Dennard Nip, MD

## 2019-01-18 ENCOUNTER — Telehealth: Payer: Self-pay | Admitting: Hematology

## 2019-01-18 ENCOUNTER — Telehealth: Payer: Self-pay | Admitting: *Deleted

## 2019-01-18 ENCOUNTER — Other Ambulatory Visit: Payer: Medicare Other

## 2019-01-18 DIAGNOSIS — Z20822 Contact with and (suspected) exposure to covid-19: Secondary | ICD-10-CM

## 2019-01-18 NOTE — Telephone Encounter (Signed)
Patient is calling back about potential exposure / She is agreeing to testing / Order placed and patient put on the schedule /

## 2019-01-18 NOTE — Telephone Encounter (Signed)
"  [name]" answered the phone when I called for the pt.   "She is sleeping now can I take a message?"  I gave him the [phone] and asked him to have her call us regarding her visit with [name].

## 2019-01-20 LAB — NOVEL CORONAVIRUS, NAA: SARS-CoV-2, NAA: NOT DETECTED

## 2019-01-22 DIAGNOSIS — I10 Essential (primary) hypertension: Secondary | ICD-10-CM | POA: Diagnosis not present

## 2019-01-22 DIAGNOSIS — Z7901 Long term (current) use of anticoagulants: Secondary | ICD-10-CM | POA: Diagnosis not present

## 2019-01-22 DIAGNOSIS — R21 Rash and other nonspecific skin eruption: Secondary | ICD-10-CM | POA: Diagnosis not present

## 2019-01-22 DIAGNOSIS — G35 Multiple sclerosis: Secondary | ICD-10-CM | POA: Diagnosis not present

## 2019-01-22 DIAGNOSIS — I4891 Unspecified atrial fibrillation: Secondary | ICD-10-CM | POA: Diagnosis not present

## 2019-01-23 DIAGNOSIS — G35 Multiple sclerosis: Secondary | ICD-10-CM | POA: Diagnosis not present

## 2019-01-23 DIAGNOSIS — I10 Essential (primary) hypertension: Secondary | ICD-10-CM | POA: Diagnosis not present

## 2019-01-23 DIAGNOSIS — Z7901 Long term (current) use of anticoagulants: Secondary | ICD-10-CM | POA: Diagnosis not present

## 2019-01-23 DIAGNOSIS — I4891 Unspecified atrial fibrillation: Secondary | ICD-10-CM | POA: Diagnosis not present

## 2019-01-23 DIAGNOSIS — R21 Rash and other nonspecific skin eruption: Secondary | ICD-10-CM | POA: Diagnosis not present

## 2019-01-24 ENCOUNTER — Ambulatory Visit (INDEPENDENT_AMBULATORY_CARE_PROVIDER_SITE_OTHER): Payer: Medicare Other | Admitting: *Deleted

## 2019-01-24 ENCOUNTER — Other Ambulatory Visit: Payer: Self-pay

## 2019-01-24 DIAGNOSIS — Z7901 Long term (current) use of anticoagulants: Secondary | ICD-10-CM | POA: Diagnosis not present

## 2019-01-24 LAB — POCT INR: INR: 4.6 — AB (ref 2.0–3.0)

## 2019-01-24 NOTE — Patient Instructions (Signed)
Description   Skip today and tomorrow's dose, then resume  1 tablet daily.  Repeat INR in on Wednesday.

## 2019-01-27 DIAGNOSIS — R21 Rash and other nonspecific skin eruption: Secondary | ICD-10-CM | POA: Diagnosis not present

## 2019-01-27 DIAGNOSIS — I4891 Unspecified atrial fibrillation: Secondary | ICD-10-CM | POA: Diagnosis not present

## 2019-01-27 DIAGNOSIS — G35 Multiple sclerosis: Secondary | ICD-10-CM | POA: Diagnosis not present

## 2019-01-27 DIAGNOSIS — I10 Essential (primary) hypertension: Secondary | ICD-10-CM | POA: Diagnosis not present

## 2019-01-27 DIAGNOSIS — Z7901 Long term (current) use of anticoagulants: Secondary | ICD-10-CM | POA: Diagnosis not present

## 2019-01-29 ENCOUNTER — Ambulatory Visit (INDEPENDENT_AMBULATORY_CARE_PROVIDER_SITE_OTHER): Payer: Medicare Other | Admitting: Pharmacist Clinician (PhC)/ Clinical Pharmacy Specialist

## 2019-01-29 DIAGNOSIS — Z7901 Long term (current) use of anticoagulants: Secondary | ICD-10-CM

## 2019-01-29 DIAGNOSIS — I10 Essential (primary) hypertension: Secondary | ICD-10-CM | POA: Diagnosis not present

## 2019-01-29 DIAGNOSIS — I4891 Unspecified atrial fibrillation: Secondary | ICD-10-CM | POA: Diagnosis not present

## 2019-01-29 DIAGNOSIS — I872 Venous insufficiency (chronic) (peripheral): Secondary | ICD-10-CM | POA: Diagnosis not present

## 2019-01-29 DIAGNOSIS — R233 Spontaneous ecchymoses: Secondary | ICD-10-CM | POA: Diagnosis not present

## 2019-01-29 DIAGNOSIS — G35 Multiple sclerosis: Secondary | ICD-10-CM | POA: Diagnosis not present

## 2019-01-29 DIAGNOSIS — R21 Rash and other nonspecific skin eruption: Secondary | ICD-10-CM | POA: Diagnosis not present

## 2019-01-29 DIAGNOSIS — M79672 Pain in left foot: Secondary | ICD-10-CM | POA: Diagnosis not present

## 2019-01-29 DIAGNOSIS — L89309 Pressure ulcer of unspecified buttock, unspecified stage: Secondary | ICD-10-CM | POA: Diagnosis not present

## 2019-01-29 LAB — POCT INR: INR: 1.7 — AB (ref 2.0–3.0)

## 2019-01-30 ENCOUNTER — Other Ambulatory Visit: Payer: Self-pay

## 2019-01-30 ENCOUNTER — Encounter (INDEPENDENT_AMBULATORY_CARE_PROVIDER_SITE_OTHER): Payer: Self-pay | Admitting: Family Medicine

## 2019-01-30 ENCOUNTER — Ambulatory Visit (INDEPENDENT_AMBULATORY_CARE_PROVIDER_SITE_OTHER): Payer: Medicare Other | Admitting: Family Medicine

## 2019-01-30 DIAGNOSIS — I10 Essential (primary) hypertension: Secondary | ICD-10-CM | POA: Diagnosis not present

## 2019-01-30 DIAGNOSIS — E669 Obesity, unspecified: Secondary | ICD-10-CM | POA: Diagnosis not present

## 2019-01-30 DIAGNOSIS — Z6834 Body mass index (BMI) 34.0-34.9, adult: Secondary | ICD-10-CM

## 2019-01-31 DIAGNOSIS — I10 Essential (primary) hypertension: Secondary | ICD-10-CM | POA: Diagnosis not present

## 2019-01-31 DIAGNOSIS — G35 Multiple sclerosis: Secondary | ICD-10-CM | POA: Diagnosis not present

## 2019-01-31 DIAGNOSIS — Z7901 Long term (current) use of anticoagulants: Secondary | ICD-10-CM | POA: Diagnosis not present

## 2019-01-31 DIAGNOSIS — I4891 Unspecified atrial fibrillation: Secondary | ICD-10-CM | POA: Diagnosis not present

## 2019-01-31 DIAGNOSIS — R21 Rash and other nonspecific skin eruption: Secondary | ICD-10-CM | POA: Diagnosis not present

## 2019-02-02 DIAGNOSIS — G35 Multiple sclerosis: Secondary | ICD-10-CM | POA: Diagnosis not present

## 2019-02-02 DIAGNOSIS — R21 Rash and other nonspecific skin eruption: Secondary | ICD-10-CM | POA: Diagnosis not present

## 2019-02-02 DIAGNOSIS — I10 Essential (primary) hypertension: Secondary | ICD-10-CM | POA: Diagnosis not present

## 2019-02-02 DIAGNOSIS — Z7901 Long term (current) use of anticoagulants: Secondary | ICD-10-CM | POA: Diagnosis not present

## 2019-02-02 DIAGNOSIS — I4891 Unspecified atrial fibrillation: Secondary | ICD-10-CM | POA: Diagnosis not present

## 2019-02-04 ENCOUNTER — Encounter: Payer: Self-pay | Admitting: Neurology

## 2019-02-04 ENCOUNTER — Ambulatory Visit: Payer: Medicare Other | Admitting: Gastroenterology

## 2019-02-05 ENCOUNTER — Telehealth (INDEPENDENT_AMBULATORY_CARE_PROVIDER_SITE_OTHER): Payer: Medicare Other | Admitting: Adult Health

## 2019-02-05 ENCOUNTER — Encounter: Payer: Self-pay | Admitting: Adult Health

## 2019-02-05 ENCOUNTER — Other Ambulatory Visit: Payer: Self-pay | Admitting: Adult Health

## 2019-02-05 DIAGNOSIS — Z7901 Long term (current) use of anticoagulants: Secondary | ICD-10-CM | POA: Diagnosis not present

## 2019-02-05 DIAGNOSIS — G4730 Sleep apnea, unspecified: Secondary | ICD-10-CM | POA: Diagnosis not present

## 2019-02-05 DIAGNOSIS — R21 Rash and other nonspecific skin eruption: Secondary | ICD-10-CM | POA: Diagnosis not present

## 2019-02-05 DIAGNOSIS — I4891 Unspecified atrial fibrillation: Secondary | ICD-10-CM | POA: Diagnosis not present

## 2019-02-05 DIAGNOSIS — G35 Multiple sclerosis: Secondary | ICD-10-CM

## 2019-02-05 DIAGNOSIS — I10 Essential (primary) hypertension: Secondary | ICD-10-CM | POA: Diagnosis not present

## 2019-02-05 LAB — POCT INR: INR: 2.3 (ref 2.0–3.0)

## 2019-02-05 NOTE — Progress Notes (Signed)
PATIENT: Toni [name] DOB: 08/31/1945  REASON FOR VISIT: follow up HISTORY FROM: patient  Virtual Visit via Video Note  I connected with Toni [name] on 02/05/19 at  3:00 PM EDT by a video enabled telemedicine application located remotely at [facility] and verified that I am speaking with the correct person using two identifiers who was located at their own home.   I discussed the limitations of evaluation and management by telemedicine and the availability of in person appointments. The patient expressed understanding and agreed to proceed.   PATIENT: Toni [name] DOB: [birth year]  REASON FOR VISIT: follow up HISTORY FROM: patient  HISTORY OF PRESENT ILLNESS: Today 02/05/19  Toni [name] is a 73 year old female with a history of multiple sclerosis and obstructive sleep apnea on BiPAP.  She reports overall she has remained stable.  She primarily uses a wheelchair for ambulation.  She was going to outpatient physical therapy however due to COVID-19 she is now doing home therapy.  She reports that she continues to have numbness and tingling in the legs.  No changes in her vision.  She had her baclofen pump refilled in April.  She continues to take baclofen p.o. as well.  She remains on gabapentin and reports that it works well for nerve pain.  She states that Dr. Brett Fairy gave her Xanax to use to sleep and that has been helpful.  Reports that she continues to see Dr. Leafy Ro.  She is currently weighing 185 pounds when she started seeing Dr. Leafy Ro she was at 278 pounds.  CPAP download indicates that she use her machine nightly for compliance of 100%.  She use her machine greater than 4 hours 29 out of 30 days for compliance of 97%.  She use her machine on average 7 hours and 16 minutes.  Her residual AHI is 7.7 on 10/6 centimeters of water.  REVIEW OF SYSTEMS: Out of a complete 14 system review of symptoms, the patient complains only of the following symptoms,  and all other reviewed systems are negative.  See HPI  ALLERGIES: Allergies  Allergen Reactions  . Hydrocodone Nausea And Vomiting    Hydrocodone causes vomiting  . Oxycodone Nausea And Vomiting    Oral oxycodone causes vomiting  . Chlorhexidine Gluconate Rash    Sores at site  . Zoloft [Sertraline] Hives, Swelling and Rash    HOME MEDICATIONS: Outpatient Medications Prior to Visit  Medication Sig Dispense Refill  . acetaminophen (TYLENOL) 500 MG tablet Take 500-1,000 mg by mouth every 6 (six) hours as needed for fever or headache (or pain).    Marland Kitchen ALPRAZolam (XANAX) 0.5 MG tablet Take 1 tablet (0.5 mg total) by mouth at bedtime as needed for anxiety or sleep. 90 tablet 0  . baclofen (LIORESAL) 20 MG tablet Once to two tablets a day for breakthrough spasms 180 tablet 0  . Biotin (PA BIOTIN) 1000 MCG tablet Take 1,000 mcg by mouth daily.    Marland Kitchen buPROPion (WELLBUTRIN XL) 300 MG 24 hr tablet Take 1 tablet (300 mg total) by mouth daily. 30 tablet 0  . CALCIUM CITRATE PO Take 2 tablets by mouth 2 (two) times daily.    . cetirizine (ZYRTEC) 10 MG tablet Take 10 mg by mouth daily.    . Cholecalciferol (VITAMIN D) 2000 units tablet Take 1 tablet (2,000 Units total) by mouth 2 (two) times daily.  0  . co-enzyme Q-10 30 MG capsule Take 30 mg by mouth daily.    Marland Kitchen  corticotropin (ACTHAR) 80 UNIT/ML injectable gel INJECT 80 UNITS (1ML)  SUBCUTANEOUSLY EVERY OTHER  DAY FOR A TOTAL OF 10 DAYS. REPEAT EVERY 30 DAYS.  (DISCARD 28 DAYS AFTER 1ST  USE) 5 mL 5  . Cranberry 1000 MG CAPS Take by mouth.    . diltiazem (CARDIZEM CD) 180 MG 24 hr capsule TAKE 1 CAPSULE BY MOUTH  DAILY 90 capsule 3  . docusate sodium (COLACE) 100 MG capsule Take 1 capsule (100 mg total) by mouth daily. 10 capsule 0  . famotidine (PEPCID) 20 MG tablet TK 1 T PO D  6  . fluticasone (FLONASE) 50 MCG/ACT nasal spray Place 2 sprays into both nostrils daily as needed for allergies.     . furosemide (LASIX) 40 MG tablet Take 80 mg by  mouth daily.     Marland Kitchen gabapentin (NEURONTIN) 300 MG capsule TAKE 1 CAPSULE BY MOUTH UP  TO 4 TIMES DAILY 360 capsule 3  . lactulose, encephalopathy, (GENERLAC) 10 GM/15ML SOLN Take 15 mLs (10 g total) by mouth 2 (two) times daily as needed. 2746 mL 0  . levothyroxine (SYNTHROID, LEVOTHROID) 75 MCG tablet Take 75 mcg by mouth daily.    Marland Kitchen LINZESS 145 MCG CAPS capsule TAKE 1 CAPSULE(145 MCG) BY MOUTH DAILY BEFORE BREAKFAST 30 capsule 2  . Multiple Vitamins-Minerals (CENTRUM SILVER PO) Take 1 tablet by mouth daily.    . nebivolol (BYSTOLIC) 10 MG tablet Take 20 mg by mouth daily.     . Omega-3 Fatty Acids (FISH OIL) 1000 MG CAPS Take 1,000 mg by mouth daily.    Marland Kitchen omeprazole (PRILOSEC) 20 MG capsule Take 20 mg by mouth daily.     . psyllium (METAMUCIL SMOOTH TEXTURE) 28 % packet Take 1 packet by mouth every other day.    . simvastatin (ZOCOR) 10 MG tablet     . SYRINGE/NEEDLE, DISP, 1 ML (BD LUER-LOK SYRINGE) 25G X 5/8" 1 ML MISC Use as directed to inject  Acthar 10 each prn  . Tamsulosin HCl (FLOMAX) 0.4 MG CAPS Take 0.4 mg by mouth daily.    Marland Kitchen UNABLE TO FIND Baclofen pump: Continuous    . warfarin (COUMADIN) 5 MG tablet TAKE 1 TABLET BY MOUTH  DAILY AS DIRECTED BY  COUMADIN CLINIC 90 tablet 1   Facility-Administered Medications Prior to Visit  Medication Dose Route Frequency Provider Last Rate Last Dose  . baclofen (LIORESAL) intrathecal injection 40 mg/19mL  40 mg Intrathecal Once Kathrynn Ducking, MD        PAST MEDICAL HISTORY: Past Medical History:  Diagnosis Date  . Abnormality of gait 03/01/2015  . Anxiety   . Arthritis    "knees" (01/19/2016)  . Atrial fibrillation with RVR ([place]) 01/19/2016  . Back pain   . Chest pain   . Childhood asthma   . Chronic pain    "nerve pain from the MS"  . Constipation   . Depression   . DVT (deep venous thrombosis) ([place]) 1983   "RLE; may have just been phlebitis; it was before the age of dopplers"  . 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 GERD (gastroesophageal reflux disease)   . History of stress test 06/21/10   limited exam with some degree of breast attenuatin of the apex however a component of apical ischemia is not excluded, EF 62%; cardiac cath   . HLD (hyperlipidemia)   . Hx of echocardiogram 04/22/10  EF 55-60%, no valve issues  . Hypertension   . Hypothyroidism   . Joint pain   . Migraine    "none since I stopped beta blocker in 1990s" (01/19/2016)  . Multiple sclerosis ([place])    has had this may 1989-[place] reg doc  . OSA on CPAP   . Osteoarthritis   . PAF (paroxysmal atrial fibrillation) ([place])    on coumadin; documented on monitor 06/2010  . Pneumonia 11/2011  . Shortness of breath   . Thyroid disease     PAST SURGICAL HISTORY: Past Surgical History:  Procedure Laterality Date  . ANTERIOR CERVICAL DECOMP/DISCECTOMY FUSION  01/2004  . BACK SURGERY    . CARDIAC CATHETERIZATION  06/22/2010   normal coronary arteries, PAF  . CATARACT EXTRACTION, BILATERAL Bilateral   . CHILD BIRTHS     X2  . DILATION AND CURETTAGE OF UTERUS    . INFUSION PUMP IMPLANTATION  ~ 2009   baclofen infusion in lower abd  . PAIN PUMP REVISION N/A 07/29/2014   Procedure: Baclofen pump replacement;  Surgeon: Erline Levine, MD;  Location: [facility];  Service: Neurosurgery;  Laterality: N/A;  Baclofen pump replacement  . TONSILLECTOMY AND ADENOIDECTOMY    . VARICOSE VEIN SURGERY  ~ 1968  . VENOUS ABLATION  12/16/2010   radiofreq ablation -Dr Elisabeth Cara and [facility]  . WISDOM TOOTH EXTRACTION      FAMILY HISTORY: Family History  Problem Relation Age of Onset  . Coronary artery disease Father        at age 58  . Hyperlipidemia Father   . Thyroid disease Father   . Coronary artery disease Maternal Grandmother   . Depression Maternal Grandmother   . Cancer Paternal Grandmother   . Depression Mother   . Sudden death Mother   . Anxiety disorder Mother   . Alcoholism Son     SOCIAL  HISTORY: Social History   Socioeconomic History  . Marital status: Married    Spouse name: [name]  . Number of children: 2  . Years of education: COLLEGE  . Highest education level: Not on file  Occupational History  . Occupation: RETIRED  Social Needs  . Financial resource strain: Not on file  . Food insecurity    Worry: Not on file    Inability: Not on file  . Transportation needs    Medical: Not on file    Non-medical: Not on file  Tobacco Use  . Smoking status: Former Smoker    Packs/day: 0.50    Years: 7.00    Pack years: 3.50    Types: Cigarettes    Quit date: 08/21/1976    Years since quitting: 42.4  . Smokeless tobacco: Never Used  Substance and Sexual Activity  . Alcohol use: Yes    Alcohol/week: 1.0 standard drinks    Types: 1 Glasses of wine per week  . Drug use: No  . Sexual activity: Not Currently  Lifestyle  . Physical activity    Days per week: Not on file    Minutes per session: Not on file  . Stress: Not on file  Relationships  . Social Herbalist on phone: Not on file    Gets together: Not on file    Attends religious service: Not on file    Active member of club or organization: Not on file    Attends meetings of clubs or organizations: Not on file    Relationship status: Not on file  . Intimate  partner violence    Fear of current or ex partner: Not on file    Emotionally abused: Not on file    Physically abused: Not on file    Forced sexual activity: Not on file  Other Topics Concern  . Not on file  Social History Narrative   Lives in [place] with Tillman Sers [phone], [phone]   Currently in rehab      PHYSICAL EXAM Generalized: Well developed, in no acute distress   Neurological examination  Mentation: Alert oriented to time, place, history taking. Follows all commands speech and language fluent Cranial nerve II-XII:Extraocular movements were full. Facial symmetry noted. uvula tongue midline. Head turning and shoulder shrug   were normal and symmetric. Sensory: Sensory testing is intact to soft touch on all 4 extremities subjectively per patient Coordination: Cerebellar testing reveals good finger-nose-finger  Gait and station: Patient is in a wheelchair. Reflexes: UTA  DIAGNOSTIC DATA (LABS, IMAGING, TESTING) - I reviewed patient records, labs, notes, testing and imaging myself where available.  Lab Results  Component Value Date   WBC 8.9 07/11/2018   HGB 14.6 07/11/2018   HCT 45 07/11/2018   MCV 93.2 07/08/2017   PLT 185 10/01/2017      Component Value Date/Time   NA 143 09/11/2018 1317   K 4.4 09/11/2018 1317   CL 106 09/11/2018 1317   CO2 23 09/11/2018 1317   GLUCOSE 114 (H) 09/11/2018 1317   GLUCOSE 92 07/08/2017 1214   BUN 25 09/11/2018 1317   CREATININE 0.68 09/11/2018 1317   CALCIUM 9.7 09/11/2018 1317   PROT 6.6 09/11/2018 1317   ALBUMIN 4.6 09/11/2018 1317   AST 20 09/11/2018 1317   ALT 33 (H) 09/11/2018 1317   ALKPHOS 62 09/11/2018 1317   BILITOT 0.3 09/11/2018 1317   GFRNONAA 88 09/11/2018 1317   GFRAA 102 09/11/2018 1317   Lab Results  Component Value Date   CHOL 147 07/11/2018   HDL 53 07/11/2018   LDLCALC 83 07/11/2018   TRIG 56 07/11/2018   CHOLHDL 3.0 04/22/2010   Lab Results  Component Value Date   HGBA1C 5.3 09/11/2018   No results found for: [id number] Lab Results  Component Value Date   TSH 0.651 11/27/2017      ASSESSMENT AND PLAN 73 y.o. year old female  has a past medical history of Abnormality of gait (03/01/2015), Anxiety, Arthritis, Atrial fibrillation with RVR ([place]) (01/19/2016), Back pain, Chest pain, Childhood asthma, Chronic pain, Constipation, Depression, DVT (deep venous thrombosis) ([place]) (1983), Edema, GERD (gastroesophageal reflux disease), History of stress test (06/21/10), HLD (hyperlipidemia), echocardiogram (04/22/10), Hypertension, Hypothyroidism, Joint pain, Migraine, Multiple sclerosis ([place]), OSA on CPAP, Osteoarthritis, PAF (paroxysmal atrial  fibrillation) ([place]), Pneumonia (11/2011), Shortness of breath, and Thyroid disease. here with:  1.  Multiple sclerosis 2.  Obstructive sleep apnea on BiPAP  Overall the patient has remained stable.  She will continue on Acthar, gabapentin and baclofen.  She is requesting a refill of her Xanax however it looks like it was just refilled in May.  She will check with her pharmacy first.  The patient is encouraged to continue using the BiPAP nightly.  I will increase her pressure to 11/7.  She is advised that if her symptoms worsen or she develops new symptoms she should let us know.  She will follow-up in 6 months or sooner if needed.   I spent 15 minutes with the patient this time was spent reviewing her medication and BiPAP download   Ward Givens,  MSN, NP-C 02/05/2019, 3:04 PM [facility] [address] [place], [place] [address] [phone]

## 2019-02-05 NOTE — Telephone Encounter (Signed)
.  Pt is requesting a refill of ALPRAZolam (XANAX) 0.5 MG tablet, to be sent to [facility], [place]

## 2019-02-05 NOTE — Telephone Encounter (Signed)
[facility] Database Verified LR: 03/04/18 Qty: 60 Pending appointment: 08/07/2019

## 2019-02-06 ENCOUNTER — Ambulatory Visit (INDEPENDENT_AMBULATORY_CARE_PROVIDER_SITE_OTHER): Payer: Medicare Other | Admitting: Internal Medicine

## 2019-02-06 ENCOUNTER — Other Ambulatory Visit (INDEPENDENT_AMBULATORY_CARE_PROVIDER_SITE_OTHER): Payer: Medicare Other

## 2019-02-06 DIAGNOSIS — G35 Multiple sclerosis: Secondary | ICD-10-CM | POA: Diagnosis not present

## 2019-02-06 DIAGNOSIS — K219 Gastro-esophageal reflux disease without esophagitis: Secondary | ICD-10-CM

## 2019-02-06 DIAGNOSIS — I4891 Unspecified atrial fibrillation: Secondary | ICD-10-CM | POA: Diagnosis not present

## 2019-02-06 DIAGNOSIS — I10 Essential (primary) hypertension: Secondary | ICD-10-CM | POA: Diagnosis not present

## 2019-02-06 DIAGNOSIS — R1032 Left lower quadrant pain: Secondary | ICD-10-CM

## 2019-02-06 DIAGNOSIS — Z7901 Long term (current) use of anticoagulants: Secondary | ICD-10-CM | POA: Diagnosis not present

## 2019-02-06 DIAGNOSIS — K5900 Constipation, unspecified: Secondary | ICD-10-CM

## 2019-02-06 DIAGNOSIS — R21 Rash and other nonspecific skin eruption: Secondary | ICD-10-CM | POA: Diagnosis not present

## 2019-02-06 LAB — FECAL OCCULT BLOOD, IMMUNOCHEMICAL: Fecal Occult Bld: NEGATIVE

## 2019-02-07 DIAGNOSIS — R21 Rash and other nonspecific skin eruption: Secondary | ICD-10-CM | POA: Diagnosis not present

## 2019-02-07 DIAGNOSIS — Z7901 Long term (current) use of anticoagulants: Secondary | ICD-10-CM | POA: Diagnosis not present

## 2019-02-07 DIAGNOSIS — I10 Essential (primary) hypertension: Secondary | ICD-10-CM | POA: Diagnosis not present

## 2019-02-07 DIAGNOSIS — G35 Multiple sclerosis: Secondary | ICD-10-CM | POA: Diagnosis not present

## 2019-02-07 DIAGNOSIS — I4891 Unspecified atrial fibrillation: Secondary | ICD-10-CM | POA: Diagnosis not present

## 2019-02-10 MED ORDER — ALPRAZOLAM 0.5 MG PO TABS: 0.5000 mg | ORAL_TABLET | Freq: Every evening | ORAL | 0 refills | Status: DC | PRN

## 2019-02-11 DIAGNOSIS — I10 Essential (primary) hypertension: Secondary | ICD-10-CM | POA: Diagnosis not present

## 2019-02-11 DIAGNOSIS — Z7901 Long term (current) use of anticoagulants: Secondary | ICD-10-CM | POA: Diagnosis not present

## 2019-02-11 DIAGNOSIS — I4891 Unspecified atrial fibrillation: Secondary | ICD-10-CM | POA: Diagnosis not present

## 2019-02-11 DIAGNOSIS — G35 Multiple sclerosis: Secondary | ICD-10-CM | POA: Diagnosis not present

## 2019-02-11 DIAGNOSIS — R21 Rash and other nonspecific skin eruption: Secondary | ICD-10-CM | POA: Diagnosis not present

## 2019-02-12 ENCOUNTER — Ambulatory Visit (INDEPENDENT_AMBULATORY_CARE_PROVIDER_SITE_OTHER): Payer: Medicare Other | Admitting: Internal Medicine

## 2019-02-12 DIAGNOSIS — Z7901 Long term (current) use of anticoagulants: Secondary | ICD-10-CM

## 2019-02-12 DIAGNOSIS — R21 Rash and other nonspecific skin eruption: Secondary | ICD-10-CM | POA: Diagnosis not present

## 2019-02-12 DIAGNOSIS — I4891 Unspecified atrial fibrillation: Secondary | ICD-10-CM | POA: Diagnosis not present

## 2019-02-12 DIAGNOSIS — G35 Multiple sclerosis: Secondary | ICD-10-CM | POA: Diagnosis not present

## 2019-02-12 DIAGNOSIS — I10 Essential (primary) hypertension: Secondary | ICD-10-CM | POA: Diagnosis not present

## 2019-02-12 LAB — POCT INR: INR: 2.5 (ref 2.0–3.0)

## 2019-02-13 ENCOUNTER — Encounter (INDEPENDENT_AMBULATORY_CARE_PROVIDER_SITE_OTHER): Payer: Self-pay | Admitting: Family Medicine

## 2019-02-13 ENCOUNTER — Other Ambulatory Visit: Payer: Self-pay

## 2019-02-13 ENCOUNTER — Telehealth (INDEPENDENT_AMBULATORY_CARE_PROVIDER_SITE_OTHER): Payer: Medicare Other | Admitting: Family Medicine

## 2019-02-13 DIAGNOSIS — E559 Vitamin D deficiency, unspecified: Secondary | ICD-10-CM

## 2019-02-13 DIAGNOSIS — I4891 Unspecified atrial fibrillation: Secondary | ICD-10-CM | POA: Diagnosis not present

## 2019-02-13 DIAGNOSIS — Z6834 Body mass index (BMI) 34.0-34.9, adult: Secondary | ICD-10-CM

## 2019-02-13 DIAGNOSIS — E669 Obesity, unspecified: Secondary | ICD-10-CM | POA: Diagnosis not present

## 2019-02-13 DIAGNOSIS — R21 Rash and other nonspecific skin eruption: Secondary | ICD-10-CM | POA: Diagnosis not present

## 2019-02-13 DIAGNOSIS — Z7901 Long term (current) use of anticoagulants: Secondary | ICD-10-CM | POA: Diagnosis not present

## 2019-02-13 DIAGNOSIS — G35 Multiple sclerosis: Secondary | ICD-10-CM | POA: Diagnosis not present

## 2019-02-13 DIAGNOSIS — I10 Essential (primary) hypertension: Secondary | ICD-10-CM | POA: Diagnosis not present

## 2019-02-14 ENCOUNTER — Encounter: Payer: Self-pay | Admitting: Gastroenterology

## 2019-02-14 ENCOUNTER — Ambulatory Visit (INDEPENDENT_AMBULATORY_CARE_PROVIDER_SITE_OTHER): Payer: Medicare Other | Admitting: Gastroenterology

## 2019-02-14 VITALS — Ht 63.0 in

## 2019-02-14 DIAGNOSIS — Z7901 Long term (current) use of anticoagulants: Secondary | ICD-10-CM | POA: Diagnosis not present

## 2019-02-14 DIAGNOSIS — G35 Multiple sclerosis: Secondary | ICD-10-CM | POA: Diagnosis not present

## 2019-02-14 DIAGNOSIS — I4891 Unspecified atrial fibrillation: Secondary | ICD-10-CM | POA: Diagnosis not present

## 2019-02-14 DIAGNOSIS — R194 Change in bowel habit: Secondary | ICD-10-CM

## 2019-02-14 DIAGNOSIS — I10 Essential (primary) hypertension: Secondary | ICD-10-CM | POA: Diagnosis not present

## 2019-02-14 DIAGNOSIS — R21 Rash and other nonspecific skin eruption: Secondary | ICD-10-CM | POA: Diagnosis not present

## 2019-02-14 DIAGNOSIS — K219 Gastro-esophageal reflux disease without esophagitis: Secondary | ICD-10-CM | POA: Diagnosis not present

## 2019-02-14 NOTE — Progress Notes (Signed)
TELEHEALTH VISIT  Referring Provider: Prince Solian, MD Primary Care Physician:  Prince Solian, MD   Tele-visit due to COVID-19 pandemic Patient requested visit virtually, consented to the virtual encounter via audio enabled telemedicine application (Doximity) Contact made at: 10:30 02/14/19 Patient verified by name and date of birth Location of patient: Home Location provider: [place] [facility] Names of persons participating: Me, patient, Tinnie Gens CMA Time spent on telehealth visit: 26 minutes I discussed the limitations of evaluation and management by telemedicine. The patient expressed understanding and agreed to proceed.  Chief complaint: Constipation   IMPRESSION:  Chronic constipation with sense of incomplete evacuation    - history of impaction    -  Metamucil QD, continued her lactulose BID, frequent stool softener at night and started Linzess 145 mcg daily LLQ pain as the constipation progresses, improved on Linzess Reflux controlled on daily PPI and H2blocker    - EGD 03/15/09: candideal pharyngitis and nonerosive esophagitis Normal colonoscopy 14 years ago [facility]) History of rectal bleeding due to hemorrhoids    - treated with Anusol HC suppository 1 twice daily for 1 week MS x 30 years, wheelchair bound IFOB negative 02/06/19 BMI 34.72  Altered change in bowel habits with ongoing sense of incomplete evacuation. Her stool frequency has improved on an aggressive bowel regimen that includes Metamucil, lactulose, stool softener and Linzess. 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 Colonoscopy recommended prior to anorectal manometry.   Continue current regimen for reflux as her symptoms are currently well controlled.  PLAN: Continue current regimen including Linzess Colonoscopy after a warfarin washout at the hospital Anorectal manometry after colonoscopy Follow-up with me after manometry   HPI: Toni [name] is a 73 y.o.  female who was initially seen in consultation 10/15/2018 for chronic constipation with a sense of incomplete evacuation.  The interval history is obtained to the patient and review of her electronic health record. She has MS x 30 years, OSA on BIPAP, spasms, renal cell carcinoma, obesity, prediabetes/insuin resistance, vitamin D deficiency. She has lost over 60 pounds in Dr. Migdalia Dk weight loss clinic on a high protein diet. She has a distant history of diverticulitis. Wheelchair bound.  I increased her Metamucil QD, continued her lactulose BID, frequent stool softener at night and started Linzess 145 mcg daily. Now having a bowel movement every day to every day. LLQ pain has largely resolved. She is satisfied with the consistency.  Notes a sense of incomplete evacuation and need to strain. Manual assistance with defecation often needed. No abdominal pain or bloating. She is concerned that the muscles from her MS are the issue.   She notes improved bowel function for the first couple of days after her Acthar  IFOB testing negative 02/06/19.   Since her last visit I reviewed prior records from Dr. Allyn Kenner that identifed the following GI issues:  Constipation: Constipation was incompletely controlled on MiraLAX.  There were intermittent concerns for impaction.  This was treated with Kristalose 10-20 daily, monthly hydrotherapy, Benefiber 1 to 2 packets daily.  She had a normal screening colonoscopy 6/12/6.   GERD: She had an EGD 03/15/2009 with findings of candidal pharyngitis and distal nonerosive esophagitis.  She was treated with 1 week of Diflucan.  Gastric biopsies were negative for H. pylori.  Her GERD was well controlled with antireflux measures including avoiding chocolate lemonade citrus, drinks, and mints.  She continued on AcipHex 20 mg twice daily.  She did not tolerate Prevacid OTC with breakthrough symptoms  of cough and brash. She has not had any recent reflux symptoms.   Rectal bleeding  attributed to hemorrhoids: Treated with Anusol HC suppository 1 twice daily for 1 week. She has had not rectal bleeding since she was seen by Dr. Allyn Kenner last.   Past Medical History:  Diagnosis Date  . Abnormality of gait 03/01/2015  . Anxiety   . Arthritis    "knees" (01/19/2016)  . Atrial fibrillation with RVR ([place]) 01/19/2016  . Back pain   . Chest pain   . Childhood asthma   . Chronic pain    "nerve pain from the MS"  . Constipation   . Depression   . DVT (deep venous thrombosis) ([place]) 1983   "RLE; may have just been phlebitis; it was before the age of dopplers"  . 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 GERD (gastroesophageal reflux disease)   . History of stress test 06/21/10   limited exam with some degree of breast attenuatin of the apex however a component of apical ischemia is not excluded, EF 62%; cardiac cath   . HLD (hyperlipidemia)   . Hx of echocardiogram 04/22/10   EF 55-60%, no valve issues  . Hypertension   . Hypothyroidism   . Joint pain   . Migraine    "none since I stopped beta blocker in 1990s" (01/19/2016)  . Multiple sclerosis ([place])    has had this may 1989-[facility] reg doc  . OSA on CPAP   . Osteoarthritis   . PAF (paroxysmal atrial fibrillation) ([place])    on coumadin; documented on monitor 06/2010  . Pneumonia 11/2011  . Shortness of breath   . Thyroid disease     Past Surgical History:  Procedure Laterality Date  . ANTERIOR CERVICAL DECOMP/DISCECTOMY FUSION  01/2004  . BACK SURGERY    . CARDIAC CATHETERIZATION  06/22/2010   normal coronary arteries, PAF  . CATARACT EXTRACTION, BILATERAL Bilateral   . CHILD BIRTHS     X2  . DILATION AND CURETTAGE OF UTERUS    . INFUSION PUMP IMPLANTATION  ~ 2009   baclofen infusion in lower abd  . PAIN PUMP REVISION N/A 07/29/2014   Procedure: Baclofen pump replacement;  Surgeon: Erline Levine, MD;  Location: [facility];  Service: Neurosurgery;  Laterality: N/A;   Baclofen pump replacement  . TONSILLECTOMY AND ADENOIDECTOMY    . VARICOSE VEIN SURGERY  ~ 1968  . VENOUS ABLATION  12/16/2010   radiofreq ablation -Dr Elisabeth Cara and [facility]  . WISDOM TOOTH EXTRACTION      Current Outpatient Medications  Medication Sig Dispense Refill  . acetaminophen (TYLENOL) 500 MG tablet Take 500-1,000 mg by mouth every 6 (six) hours as needed for fever or headache (or pain).    Marland Kitchen ALPRAZolam (XANAX) 0.5 MG tablet Take 1 tablet (0.5 mg total) by mouth at bedtime as needed for anxiety or sleep. 30 tablet 0  . baclofen (LIORESAL) 20 MG tablet Once to two tablets a day for breakthrough spasms 180 tablet 0  . Biotin (PA BIOTIN) 1000 MCG tablet Take 1,000 mcg by mouth daily.    Marland Kitchen buPROPion (WELLBUTRIN XL) 300 MG 24 hr tablet Take 1 tablet (300 mg total) by mouth daily. 30 tablet 0  . CALCIUM CITRATE PO Take 2 tablets by mouth 2 (two) times daily.    . cetirizine (ZYRTEC) 10 MG tablet Take 10 mg by mouth daily.    Marland Kitchen  Cholecalciferol (VITAMIN D) 2000 units tablet Take 1 tablet (2,000 Units total) by mouth 2 (two) times daily.  0  . co-enzyme Q-10 30 MG capsule Take 30 mg by mouth daily.    . corticotropin (ACTHAR) 80 UNIT/ML injectable gel INJECT 80 UNITS (1ML)  SUBCUTANEOUSLY EVERY OTHER  DAY FOR A TOTAL OF 10 DAYS. REPEAT EVERY 30 DAYS.  (DISCARD 28 DAYS AFTER 1ST  USE) 5 mL 5  . Cranberry 1000 MG CAPS Take by mouth.    . diltiazem (CARDIZEM CD) 180 MG 24 hr capsule TAKE 1 CAPSULE BY MOUTH  DAILY 90 capsule 3  . docusate sodium (COLACE) 100 MG capsule Take 1 capsule (100 mg total) by mouth daily. 10 capsule 0  . famotidine (PEPCID) 20 MG tablet TK 1 T PO D  6  . fluticasone (FLONASE) 50 MCG/ACT nasal spray Place 2 sprays into both nostrils daily as needed for allergies.     . furosemide (LASIX) 40 MG tablet Take 80 mg by mouth daily.     Marland Kitchen gabapentin (NEURONTIN) 300 MG capsule TAKE 1 CAPSULE BY MOUTH UP  TO 4 TIMES DAILY 360 capsule 3  . lactulose, encephalopathy, (GENERLAC) 10  GM/15ML SOLN Take 15 mLs (10 g total) by mouth 2 (two) times daily as needed. 2746 mL 0  . levothyroxine (SYNTHROID, LEVOTHROID) 75 MCG tablet Take 75 mcg by mouth daily.    Marland Kitchen LINZESS 145 MCG CAPS capsule TAKE 1 CAPSULE(145 MCG) BY MOUTH DAILY BEFORE BREAKFAST 30 capsule 2  . Multiple Vitamins-Minerals (CENTRUM SILVER PO) Take 1 tablet by mouth daily.    . nebivolol (BYSTOLIC) 10 MG tablet Take 20 mg by mouth daily.     . Omega-3 Fatty Acids (FISH OIL) 1000 MG CAPS Take 1,000 mg by mouth daily.    Marland Kitchen omeprazole (PRILOSEC) 20 MG capsule Take 20 mg by mouth daily.     . psyllium (METAMUCIL SMOOTH TEXTURE) 28 % packet Take 1 packet by mouth every other day.    . simvastatin (ZOCOR) 10 MG tablet     . SYRINGE/NEEDLE, DISP, 1 ML (BD LUER-LOK SYRINGE) 25G X 5/8" 1 ML MISC Use as directed to inject  Acthar 10 each prn  . Tamsulosin HCl (FLOMAX) 0.4 MG CAPS Take 0.4 mg by mouth daily.    Marland Kitchen UNABLE TO FIND Baclofen pump: Continuous    . warfarin (COUMADIN) 5 MG tablet TAKE 1 TABLET BY MOUTH  DAILY AS DIRECTED BY  COUMADIN CLINIC 90 tablet 1   Current Facility-Administered Medications  Medication Dose Route Frequency Provider Last Rate Last Dose  . baclofen (LIORESAL) intrathecal injection 40 mg/49mL  40 mg Intrathecal Once Kathrynn Ducking, MD        Allergies as of 02/14/2019 - Review Complete 02/13/2019  Allergen Reaction Noted  . Hydrocodone Nausea And Vomiting 11/26/2011  . Oxycodone Nausea And Vomiting 05/12/2013  . Chlorhexidine gluconate Rash 11/26/2011  . Zoloft [sertraline] Hives, Swelling, and Rash 05/15/2013    Family History  Problem Relation Age of Onset  . Coronary artery disease Father        at age 54  . Hyperlipidemia Father   . Thyroid disease Father   . Coronary artery disease Maternal Grandmother   . Depression Maternal Grandmother   . Cancer Paternal Grandmother   . Depression Mother   . Sudden death Mother   . Anxiety disorder Mother   . Alcoholism Son     Social  History   Socioeconomic History  . Marital  status: Married    Spouse name: [name]  . Number of children: 2  . Years of education: COLLEGE  . Highest education level: Not on file  Occupational History  . Occupation: RETIRED  Social Needs  . Financial resource strain: Not on file  . Food insecurity    Worry: Not on file    Inability: Not on file  . Transportation needs    Medical: Not on file    Non-medical: Not on file  Tobacco Use  . Smoking status: Former Smoker    Packs/day: 0.50    Years: 7.00    Pack years: 3.50    Types: Cigarettes    Quit date: 08/21/1976    Years since quitting: 42.5  . Smokeless tobacco: Never Used  Substance and Sexual Activity  . Alcohol use: Yes    Alcohol/week: 1.0 standard drinks    Types: 1 Glasses of wine per week  . Drug use: No  . Sexual activity: Not Currently  Lifestyle  . Physical activity    Days per week: Not on file    Minutes per session: Not on file  . Stress: Not on file  Relationships  . Social Herbalist on phone: Not on file    Gets together: Not on file    Attends religious service: Not on file    Active member of club or organization: Not on file    Attends meetings of clubs or organizations: Not on file    Relationship status: Not on file  . Intimate partner violence    Fear of current or ex partner: Not on file    Emotionally abused: Not on file    Physically abused: Not on file    Forced sexual activity: Not on file  Other Topics Concern  . Not on file  Social History Narrative   Lives in [place] with Husband [name] [phone], [phone]   Currently in rehab    Review of Systems: ALL ROS discussed and all others negative except listed in HPI.  Physical Exam: Complete physical exam not performed due to the limits inherent in a telehealth encounter.  General: Awake, alert, and oriented, and well communicative. In no acute distress.  Pulm: No labored breathing, speaking in full sentences without  conversational dyspnea  Psych: Pleasant, cooperative, normal speech, normal affect and normal insight Neuro: Alert and appropriate   Dravyn Severs L. Tarri Glenn, MD, MPH [facility] 02/14/2019, 8:48 AM

## 2019-02-17 NOTE — Progress Notes (Signed)
Office: [phone]  /  Fax: [phone] TeleHealth Visit:  Toni [name] has verbally consented to this TeleHealth visit today. The patient is located at home, the provider is located at the [facility] office. The participants in this visit include the listed provider and patient. The visit was conducted today via Face Time  HPI:   Chief Complaint: OBESITY [name] is here to discuss her progress with her obesity treatment plan. She is on the Category 1 plan and is following her eating plan approximately 85-90% of the time. She states she is doing PT in her home.  [name] continues to maintain her weight well. She is doing home PT and OT and is dealing with an early pressure sore. She has had a lot of office visits with various specialists with TeleHealth recently and is feeling discouraged that she isn't losing weight better.  We were unable to weigh the patient today for this TeleHealth visit. She feels as if she has maintained her weight since her last visit. She has lost 61 lbs since starting treatment with [name].  Vitamin D deficiency [name] has a diagnosis of Vitamin D deficiency. She is currently stable on OTC Vit D and denies nausea, vomiting or muscle weakness. [name] is due for labs but it is not yet safe for the patient to come into the office.  ASSESSMENT AND PLAN:  Vitamin D deficiency  Class 1 obesity with serious comorbidity and body mass index (BMI) of 34.0 to 34.9 in adult, unspecified obesity type  PLAN:  Vitamin D Deficiency [name] was informed that low Vitamin D levels contributes to fatigue and are associated with obesity, breast, and colon cancer. She agrees to continue taking OTC Vit D and will try to recheck labs in 1 month at her next in office visit. She was informed of the risk of over-replacement of Vitamin D and agrees to not increase her dose unless she discusses this with [name] first. [name] agrees to follow-up with our clinic  in 3 weeks.  I spent > than 50% of the 25 minute visit on counseling as documented in the note.  Obesity [name] is currently in the action stage of change. As such, her goal is to continue with weight loss efforts. She has agreed to follow the Category 1 plan. [name] was educated on the need to maintain her weight better for now until she has resolved some of her other health issues. She was congratulated for not gaining and encouraged to keep up the good work. [name] has been instructed to work up to a goal of 150 minutes of combined cardio and strengthening exercise per week for weight loss and overall health benefits. We discussed the following Behavioral Modification Strategies today: increasing lean protein intake, decreasing simple carbohydrates, work on meal planning and easy cooking plans.  [name] has agreed to follow-up with our clinic in 3 weeks. She was informed of the importance of frequent follow-up visits to maximize her success with intensive lifestyle modifications for her multiple health conditions.  ALLERGIES: Allergies  Allergen Reactions  . Hydrocodone Nausea And Vomiting    Hydrocodone causes vomiting  . Oxycodone Nausea And Vomiting    Oral oxycodone causes vomiting  . Chlorhexidine Gluconate Rash    Sores at site  . Zoloft [Sertraline] Hives, Swelling and Rash    MEDICATIONS: Current Outpatient Medications on File Prior to Visit  Medication Sig Dispense Refill  . acetaminophen (TYLENOL) 500 MG tablet Take 500-1,000 mg by mouth every 6 (  six) hours as needed for fever or headache (or pain).    Marland Kitchen ALPRAZolam (XANAX) 0.5 MG tablet Take 1 tablet (0.5 mg total) by mouth at bedtime as needed for anxiety or sleep. 30 tablet 0  . baclofen (LIORESAL) 20 MG tablet Once to two tablets a day for breakthrough spasms 180 tablet 0  . Biotin (PA BIOTIN) 1000 MCG tablet Take 1,000 mcg by mouth daily.    Marland Kitchen buPROPion (WELLBUTRIN XL) 300 MG 24 hr tablet Take 1 tablet  (300 mg total) by mouth daily. 30 tablet 0  . CALCIUM CITRATE PO Take 2 tablets by mouth 2 (two) times daily.    . cetirizine (ZYRTEC) 10 MG tablet Take 10 mg by mouth daily.    . Cholecalciferol (VITAMIN D) 2000 units tablet Take 1 tablet (2,000 Units total) by mouth 2 (two) times daily.  0  . co-enzyme Q-10 30 MG capsule Take 30 mg by mouth daily.    . corticotropin (ACTHAR) 80 UNIT/ML injectable gel INJECT 80 UNITS (1ML)  SUBCUTANEOUSLY EVERY OTHER  DAY FOR A TOTAL OF 10 DAYS. REPEAT EVERY 30 DAYS.  (DISCARD 28 DAYS AFTER 1ST  USE) 5 mL 5  . Cranberry 1000 MG CAPS Take by mouth.    . diltiazem (CARDIZEM CD) 180 MG 24 hr capsule TAKE 1 CAPSULE BY MOUTH  DAILY 90 capsule 3  . docusate sodium (COLACE) 100 MG capsule Take 1 capsule (100 mg total) by mouth daily. 10 capsule 0  . famotidine (PEPCID) 20 MG tablet TK 1 T PO D  6  . fluticasone (FLONASE) 50 MCG/ACT nasal spray Place 2 sprays into both nostrils daily as needed for allergies.     . furosemide (LASIX) 40 MG tablet Take 80 mg by mouth daily.     Marland Kitchen gabapentin (NEURONTIN) 300 MG capsule TAKE 1 CAPSULE BY MOUTH UP  TO 4 TIMES DAILY 360 capsule 3  . lactulose, encephalopathy, (GENERLAC) 10 GM/15ML SOLN Take 15 mLs (10 g total) by mouth 2 (two) times daily as needed. 2746 mL 0  . levothyroxine (SYNTHROID, LEVOTHROID) 75 MCG tablet Take 75 mcg by mouth daily.    Marland Kitchen LINZESS 145 MCG CAPS capsule TAKE 1 CAPSULE(145 MCG) BY MOUTH DAILY BEFORE BREAKFAST 30 capsule 2  . Multiple Vitamins-Minerals (CENTRUM SILVER PO) Take 1 tablet by mouth daily.    . nebivolol (BYSTOLIC) 10 MG tablet Take 20 mg by mouth daily.     . Omega-3 Fatty Acids (FISH OIL) 1000 MG CAPS Take 1,000 mg by mouth daily.    Marland Kitchen omeprazole (PRILOSEC) 20 MG capsule Take 20 mg by mouth daily.     . psyllium (METAMUCIL SMOOTH TEXTURE) 28 % packet Take 1 packet by mouth every other day.    . simvastatin (ZOCOR) 10 MG tablet     . SYRINGE/NEEDLE, DISP, 1 ML (BD LUER-LOK SYRINGE) 25G X 5/8"  1 ML MISC Use as directed to inject  Acthar 10 each prn  . Tamsulosin HCl (FLOMAX) 0.4 MG CAPS Take 0.4 mg by mouth daily.    Marland Kitchen UNABLE TO FIND Baclofen pump: Continuous    . warfarin (COUMADIN) 5 MG tablet TAKE 1 TABLET BY MOUTH  DAILY AS DIRECTED BY  COUMADIN CLINIC 90 tablet 1   Current Facility-Administered Medications on File Prior to Visit  Medication Dose Route Frequency Provider Last Rate Last Dose  . baclofen (LIORESAL) intrathecal injection 40 mg/68mL  40 mg Intrathecal Once Kathrynn Ducking, MD  PAST MEDICAL HISTORY: Past Medical History:  Diagnosis Date  . Abnormality of gait 03/01/2015  . Anxiety   . Arthritis    "knees" (01/19/2016)  . Atrial fibrillation with RVR ([place]) 01/19/2016  . Back pain   . Chest pain   . Childhood asthma   . Chronic pain    "nerve pain from the MS"  . Constipation   . Depression   . DVT (deep venous thrombosis) ([place]) 1983   "RLE; may have just been phlebitis; it was before the age of dopplers"  . 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 GERD (gastroesophageal reflux disease)   . History of stress test 06/21/10   limited exam with some degree of breast attenuatin of the apex however a component of apical ischemia is not excluded, EF 62%; cardiac cath   . HLD (hyperlipidemia)   . Hx of echocardiogram 04/22/10   EF 55-60%, no valve issues  . Hypertension   . Hypothyroidism   . Joint pain   . Migraine    "none since I stopped beta blocker in 1990s" (01/19/2016)  . Multiple sclerosis ([place])    has had this may 1989-[facility] reg doc  . OSA on CPAP   . Osteoarthritis   . PAF (paroxysmal atrial fibrillation) ([place])    on coumadin; documented on monitor 06/2010  . Pneumonia 11/2011  . Shortness of breath   . Thyroid disease     PAST SURGICAL HISTORY: Past Surgical History:  Procedure Laterality Date  . ANTERIOR CERVICAL DECOMP/DISCECTOMY FUSION  01/2004  . BACK SURGERY    . CARDIAC  CATHETERIZATION  06/22/2010   normal coronary arteries, PAF  . CATARACT EXTRACTION, BILATERAL Bilateral   . CHILD BIRTHS     X2  . DILATION AND CURETTAGE OF UTERUS    . INFUSION PUMP IMPLANTATION  ~ 2009   baclofen infusion in lower abd  . PAIN PUMP REVISION N/A 07/29/2014   Procedure: Baclofen pump replacement;  Surgeon: Erline Levine, MD;  Location: [facility];  Service: Neurosurgery;  Laterality: N/A;  Baclofen pump replacement  . TONSILLECTOMY AND ADENOIDECTOMY    . VARICOSE VEIN SURGERY  ~ 1968  . VENOUS ABLATION  12/16/2010   radiofreq ablation -Dr Elisabeth Cara and [facility]  . WISDOM TOOTH EXTRACTION      SOCIAL HISTORY: Social History   Tobacco Use  . Smoking status: Former Smoker    Packs/day: 0.50    Years: 7.00    Pack years: 3.50    Types: Cigarettes    Quit date: 08/21/1976    Years since quitting: 42.5  . Smokeless tobacco: Never Used  Substance Use Topics  . Alcohol use: Yes    Alcohol/week: 1.0 standard drinks    Types: 1 Glasses of wine per week  . Drug use: No    FAMILY HISTORY: Family History  Problem Relation Age of Onset  . Coronary artery disease Father        at age 52  . Hyperlipidemia Father   . Thyroid disease Father   . Coronary artery disease Maternal Grandmother   . Depression Maternal Grandmother   . Cancer Paternal Grandmother   . Depression Mother   . Sudden death Mother   . Anxiety disorder Mother   . Alcoholism Son    ROS: Review of Systems  Gastrointestinal: Negative for nausea and vomiting.  Musculoskeletal:       Negative for  muscle weakness.   PHYSICAL EXAM: Pt in no acute distress  RECENT LABS AND TESTS: BMET    Component Value Date/Time   NA 143 09/11/2018 1317   K 4.4 09/11/2018 1317   CL 106 09/11/2018 1317   CO2 23 09/11/2018 1317   GLUCOSE 114 (H) 09/11/2018 1317   GLUCOSE 92 07/08/2017 1214   BUN 25 09/11/2018 1317   CREATININE 0.68 09/11/2018 1317   CALCIUM 9.7 09/11/2018 1317   GFRNONAA 88 09/11/2018 1317    GFRAA 102 09/11/2018 1317   Lab Results  Component Value Date   HGBA1C 5.3 09/11/2018   HGBA1C 5.3 04/23/2018   HGBA1C 5.2 10/01/2017   HGBA1C 5.4 03/08/2017   HGBA1C  04/21/2010    5.5 (NOTE)                                                                       According to the ADA Clinical Practice Recommendations for 2011, when HbA1c is used as a screening test:   >=6.5%   Diagnostic of Diabetes Mellitus           (if abnormal result  is confirmed)  5.7-6.4%   Increased risk of developing Diabetes Mellitus  References:Diagnosis and Classification of Diabetes Mellitus,Diabetes YKDX,8338,25(KNLZJ 1):S62-S69 and Standards of Medical Care in         Diabetes - 2011,Diabetes Care,2011,34  (Suppl 1):S11-S61.   Lab Results  Component Value Date   INSULIN 13.0 09/11/2018   INSULIN 7.8 04/23/2018   INSULIN 20.4 11/27/2017   INSULIN 20.4 03/08/2017   CBC    Component Value Date/Time   WBC 8.9 07/11/2018   WBC 8.4 07/08/2017 1214   RBC 4.83 07/08/2017 1214   HGB 14.6 07/11/2018   HGB 14.1 03/08/2017 1152   HCT 45 07/11/2018   HCT 43.0 03/08/2017 1152   PLT 185 10/01/2017   PLT 219 12/18/2014 1157   MCV 93.2 07/08/2017 1214   MCV 93 03/08/2017 1152   MCH 31.5 07/08/2017 1214   MCHC 33.8 07/08/2017 1214   RDW 13.3 07/08/2017 1214   RDW 13.3 03/08/2017 1152   LYMPHSABS 1.6 07/08/2017 1214   LYMPHSABS 2.2 03/08/2017 1152   MONOABS 1.3 (H) 07/08/2017 1214   EOSABS 0.2 07/08/2017 1214   EOSABS 0.1 03/08/2017 1152   BASOSABS 0.0 07/08/2017 1214   BASOSABS 0.0 03/08/2017 1152   Iron/TIBC/Ferritin/ %Sat No results found for: IRON, TIBC, FERRITIN, IRONPCTSAT Lipid Panel     Component Value Date/Time   CHOL 147 07/11/2018   CHOL 129 11/27/2017 1011   TRIG 56 07/11/2018   HDL 53 07/11/2018   HDL 51 11/27/2017 1011   CHOLHDL 3.0 04/22/2010 0536   VLDL 29 04/22/2010 0536   LDLCALC 83 07/11/2018   LDLCALC 66 11/27/2017 1011   Hepatic Function Panel     Component Value  Date/Time   PROT 6.6 09/11/2018 1317   ALBUMIN 4.6 09/11/2018 1317   AST 20 09/11/2018 1317   ALT 33 (H) 09/11/2018 1317   ALKPHOS 62 09/11/2018 1317   BILITOT 0.3 09/11/2018 1317      Component Value Date/Time   TSH 0.651 11/27/2017 1011   TSH 2.410 03/08/2017 1152   TSH 1.083 08/06/2016 0647   TSH 1.871 01/19/2016 2302  TSH 0.206 (L) 03/10/2011 1351   Results for Bogdanski, KEYIA MORETTO (MRN [id number]) as of 02/17/2019 12:04  Ref. Range 09/11/2018 13:17  Vitamin D, 25-Hydroxy Latest Ref Range: 30.0 - 100.0 ng/mL 57.5    I, Michaelene Song, am acting as Location manager for Dennard Nip, MD I have reviewed the above documentation for accuracy and completeness, and I agree with the above. -Dennard Nip, MD

## 2019-02-17 NOTE — Progress Notes (Signed)
Office: [phone]  /  Fax: [phone] TeleHealth Visit:  Toni [name] has verbally consented to this TeleHealth visit today. The patient is located at home, the provider is located at the [facility] office. The participants in this visit include the listed provider and patient and any and all parties involved. The visit was conducted today via FaceTime.  HPI:   Chief Complaint: OBESITY Toni [name] is here to discuss her progress with her obesity treatment plan. She is on the Category 1 plan and is following her eating plan approximately 90 % of the time. She states she is doing physical therapy 1 time per week, so she is doing leg and arm exercises, maybe 2 hours per week. Toni [name] continues to do well maintaining her weight, since her last office visit. She sometimes struggles to eat all of the food on her plan, but she is working on this. Hunger is controlled. We were unable to weigh the patient today for this TeleHealth visit. She feels as if she has maintained weight since her last visit.   Hypertension Toni [name] is a 73 y.o. female with hypertension. Her blood pressure is stable today, and was 135/73. Toni [name] denies chest pain, headache or dizziness. She is doing very well with diet and weight loss, and she is at a higher risk of developing hypotension. She is working weight loss to help control her blood pressure with the goal of decreasing her risk of heart attack and stroke.  ASSESSMENT AND PLAN:  Essential hypertension  Class 1 obesity with serious comorbidity and body mass index (BMI) of 34.0 to 34.9 in adult, unspecified obesity type  PLAN:  Hypertension We discussed sodium restriction, working on healthy weight loss, and a regular exercise program as the means to achieve improved blood pressure control. Dortha agreed with this plan and agreed to follow up as directed. She was warned to watch for signs of feeling lightheaded or dizzy.  We will continue to monitor her blood pressure as well as her progress with the above lifestyle modifications.   I spent > than 50% of the 15 minute visit on counseling as documented in the note.  Obesity Syniyah is currently in the action stage of change. As such, her goal is to continue with weight loss efforts She has agreed to follow the Category 1 plan Toni [name] has been instructed to work up to a goal of 150 minutes of combined cardio and strengthening exercise per week for weight loss and overall health benefits. We discussed the following Behavioral Modification Strategies today: no skipping meals and work on meal planning and easy cooking plans  Toni [name] has agreed to follow up with our clinic in 2 to 3 weeks. She was informed of the importance of frequent follow up visits to maximize her success with intensive lifestyle modifications for her multiple health conditions.  ALLERGIES: Allergies  Allergen Reactions  . Hydrocodone Nausea And Vomiting    Hydrocodone causes vomiting  . Oxycodone Nausea And Vomiting    Oral oxycodone causes vomiting  . Chlorhexidine Gluconate Rash    Sores at site  . Zoloft [Sertraline] Hives, Swelling and Rash    MEDICATIONS: Current Outpatient Medications on File Prior to Visit  Medication Sig Dispense Refill  . acetaminophen (TYLENOL) 500 MG tablet Take 500-1,000 mg by mouth every 6 (six) hours as needed for fever or headache (or pain).    . baclofen (LIORESAL) 20 MG tablet Once to two tablets a day for breakthrough  spasms 180 tablet 0  . Biotin (PA BIOTIN) 1000 MCG tablet Take 1,000 mcg by mouth daily.    Marland Kitchen buPROPion (WELLBUTRIN XL) 300 MG 24 hr tablet Take 1 tablet (300 mg total) by mouth daily. 30 tablet 0  . CALCIUM CITRATE PO Take 2 tablets by mouth 2 (two) times daily.    . cetirizine (ZYRTEC) 10 MG tablet Take 10 mg by mouth daily.    . Cholecalciferol (VITAMIN D) 2000 units tablet Take 1 tablet (2,000 Units total) by mouth 2 (two)  times daily.  0  . co-enzyme Q-10 30 MG capsule Take 30 mg by mouth daily.    . corticotropin (ACTHAR) 80 UNIT/ML injectable gel INJECT 80 UNITS (1ML)  SUBCUTANEOUSLY EVERY OTHER  DAY FOR A TOTAL OF 10 DAYS. REPEAT EVERY 30 DAYS.  (DISCARD 28 DAYS AFTER 1ST  USE) 5 mL 5  . Cranberry 1000 MG CAPS Take by mouth.    . diltiazem (CARDIZEM CD) 180 MG 24 hr capsule TAKE 1 CAPSULE BY MOUTH  DAILY 90 capsule 3  . docusate sodium (COLACE) 100 MG capsule Take 1 capsule (100 mg total) by mouth daily. 10 capsule 0  . famotidine (PEPCID) 20 MG tablet TK 1 T PO D  6  . fluticasone (FLONASE) 50 MCG/ACT nasal spray Place 2 sprays into both nostrils daily as needed for allergies.     . furosemide (LASIX) 40 MG tablet Take 80 mg by mouth daily.     Marland Kitchen gabapentin (NEURONTIN) 300 MG capsule TAKE 1 CAPSULE BY MOUTH UP  TO 4 TIMES DAILY 360 capsule 3  . lactulose, encephalopathy, (GENERLAC) 10 GM/15ML SOLN Take 15 mLs (10 g total) by mouth 2 (two) times daily as needed. 2746 mL 0  . levothyroxine (SYNTHROID, LEVOTHROID) 75 MCG tablet Take 75 mcg by mouth daily.    Marland Kitchen LINZESS 145 MCG CAPS capsule TAKE 1 CAPSULE(145 MCG) BY MOUTH DAILY BEFORE BREAKFAST 30 capsule 2  . Multiple Vitamins-Minerals (CENTRUM SILVER PO) Take 1 tablet by mouth daily.    . nebivolol (BYSTOLIC) 10 MG tablet Take 20 mg by mouth daily.     . Omega-3 Fatty Acids (FISH OIL) 1000 MG CAPS Take 1,000 mg by mouth daily.    Marland Kitchen omeprazole (PRILOSEC) 20 MG capsule Take 20 mg by mouth daily.     . psyllium (METAMUCIL SMOOTH TEXTURE) 28 % packet Take 1 packet by mouth every other day.    . simvastatin (ZOCOR) 10 MG tablet     . SYRINGE/NEEDLE, DISP, 1 ML (BD LUER-LOK SYRINGE) 25G X 5/8" 1 ML MISC Use as directed to inject  Acthar 10 each prn  . Tamsulosin HCl (FLOMAX) 0.4 MG CAPS Take 0.4 mg by mouth daily.    Marland Kitchen UNABLE TO FIND Baclofen pump: Continuous    . warfarin (COUMADIN) 5 MG tablet TAKE 1 TABLET BY MOUTH  DAILY AS DIRECTED BY  COUMADIN CLINIC 90 tablet  1   Current Facility-Administered Medications on File Prior to Visit  Medication Dose Route Frequency Provider Last Rate Last Dose  . baclofen (LIORESAL) intrathecal injection 40 mg/22mL  40 mg Intrathecal Once Kathrynn Ducking, MD        PAST MEDICAL HISTORY: Past Medical History:  Diagnosis Date  . Abnormality of gait 03/01/2015  . Anxiety   . Arthritis    "knees" (01/19/2016)  . Atrial fibrillation with RVR ([place]) 01/19/2016  . Back pain   . Chest pain   . Childhood asthma   . Chronic  pain    "nerve pain from the MS"  . Constipation   . Depression   . DVT (deep venous thrombosis) ([place]) 1983   "RLE; may have just been phlebitis; it was before the age of dopplers"  . 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 GERD (gastroesophageal reflux disease)   . History of stress test 06/21/10   limited exam with some degree of breast attenuatin of the apex however a component of apical ischemia is not excluded, EF 62%; cardiac cath   . HLD (hyperlipidemia)   . Hx of echocardiogram 04/22/10   EF 55-60%, no valve issues  . Hypertension   . Hypothyroidism   . Joint pain   . Migraine    "none since I stopped beta blocker in 1990s" (01/19/2016)  . Multiple sclerosis ([place])    has had this may 1989-[place] reg doc  . OSA on CPAP   . Osteoarthritis   . PAF (paroxysmal atrial fibrillation) ([place])    on coumadin; documented on monitor 06/2010  . Pneumonia 11/2011  . Shortness of breath   . Thyroid disease     PAST SURGICAL HISTORY: Past Surgical History:  Procedure Laterality Date  . ANTERIOR CERVICAL DECOMP/DISCECTOMY FUSION  01/2004  . BACK SURGERY    . CARDIAC CATHETERIZATION  06/22/2010   normal coronary arteries, PAF  . CATARACT EXTRACTION, BILATERAL Bilateral   . CHILD BIRTHS     X2  . DILATION AND CURETTAGE OF UTERUS    . INFUSION PUMP IMPLANTATION  ~ 2009   baclofen infusion in lower abd  . PAIN PUMP REVISION N/A 07/29/2014    Procedure: Baclofen pump replacement;  Surgeon: Erline Levine, MD;  Location: [facility];  Service: Neurosurgery;  Laterality: N/A;  Baclofen pump replacement  . TONSILLECTOMY AND ADENOIDECTOMY    . VARICOSE VEIN SURGERY  ~ 1968  . VENOUS ABLATION  12/16/2010   radiofreq ablation -Dr Elisabeth Cara and [facility]  . WISDOM TOOTH EXTRACTION      SOCIAL HISTORY: Social History   Tobacco Use  . Smoking status: Former Smoker    Packs/day: 0.50    Years: 7.00    Pack years: 3.50    Types: Cigarettes    Quit date: 08/21/1976    Years since quitting: 42.5  . Smokeless tobacco: Never Used  Substance Use Topics  . Alcohol use: Yes    Alcohol/week: 1.0 standard drinks    Types: 1 Glasses of wine per week  . Drug use: No    FAMILY HISTORY: Family History  Problem Relation Age of Onset  . Coronary artery disease Father        at age 53  . Hyperlipidemia Father   . Thyroid disease Father   . Coronary artery disease Maternal Grandmother   . Depression Maternal Grandmother   . Cancer Paternal Grandmother   . Depression Mother   . Sudden death Mother   . Anxiety disorder Mother   . Alcoholism Son     ROS: Review of Systems  Constitutional: Negative for weight loss.  Cardiovascular: Negative for chest pain.  Neurological: Negative for dizziness and headaches.    PHYSICAL EXAM: Pt in no acute distress  RECENT LABS AND TESTS: BMET    Component Value Date/Time   NA 143 09/11/2018 1317   K 4.4 09/11/2018 1317   CL 106 09/11/2018 1317   CO2 23 09/11/2018 1317   GLUCOSE 114 (  H) 09/11/2018 1317   GLUCOSE 92 07/08/2017 1214   BUN 25 09/11/2018 1317   CREATININE 0.68 09/11/2018 1317   CALCIUM 9.7 09/11/2018 1317   GFRNONAA 88 09/11/2018 1317   GFRAA 102 09/11/2018 1317   Lab Results  Component Value Date   HGBA1C 5.3 09/11/2018   HGBA1C 5.3 04/23/2018   HGBA1C 5.2 10/01/2017   HGBA1C 5.4 03/08/2017   HGBA1C  04/21/2010    5.5 (NOTE)                                                                        According to the ADA Clinical Practice Recommendations for 2011, when HbA1c is used as a screening test:   >=6.5%   Diagnostic of Diabetes Mellitus           (if abnormal result  is confirmed)  5.7-6.4%   Increased risk of developing Diabetes Mellitus  References:Diagnosis and Classification of Diabetes Mellitus,Diabetes ZOXW,9604,54(UJWJX 1):S62-S69 and Standards of Medical Care in         Diabetes - 2011,Diabetes Care,2011,34  (Suppl 1):S11-S61.   Lab Results  Component Value Date   INSULIN 13.0 09/11/2018   INSULIN 7.8 04/23/2018   INSULIN 20.4 11/27/2017   INSULIN 20.4 03/08/2017   CBC    Component Value Date/Time   WBC 8.9 07/11/2018   WBC 8.4 07/08/2017 1214   RBC 4.83 07/08/2017 1214   HGB 14.6 07/11/2018   HGB 14.1 03/08/2017 1152   HCT 45 07/11/2018   HCT 43.0 03/08/2017 1152   PLT 185 10/01/2017   PLT 219 12/18/2014 1157   MCV 93.2 07/08/2017 1214   MCV 93 03/08/2017 1152   MCH 31.5 07/08/2017 1214   MCHC 33.8 07/08/2017 1214   RDW 13.3 07/08/2017 1214   RDW 13.3 03/08/2017 1152   LYMPHSABS 1.6 07/08/2017 1214   LYMPHSABS 2.2 03/08/2017 1152   MONOABS 1.3 (H) 07/08/2017 1214   EOSABS 0.2 07/08/2017 1214   EOSABS 0.1 03/08/2017 1152   BASOSABS 0.0 07/08/2017 1214   BASOSABS 0.0 03/08/2017 1152   Iron/TIBC/Ferritin/ %Sat No results found for: IRON, TIBC, FERRITIN, IRONPCTSAT Lipid Panel     Component Value Date/Time   CHOL 147 07/11/2018   CHOL 129 11/27/2017 1011   TRIG 56 07/11/2018   HDL 53 07/11/2018   HDL 51 11/27/2017 1011   CHOLHDL 3.0 04/22/2010 0536   VLDL 29 04/22/2010 0536   LDLCALC 83 07/11/2018   LDLCALC 66 11/27/2017 1011   Hepatic Function Panel     Component Value Date/Time   PROT 6.6 09/11/2018 1317   ALBUMIN 4.6 09/11/2018 1317   AST 20 09/11/2018 1317   ALT 33 (H) 09/11/2018 1317   ALKPHOS 62 09/11/2018 1317   BILITOT 0.3 09/11/2018 1317      Component Value Date/Time   TSH 0.651 11/27/2017 1011   TSH  2.410 03/08/2017 1152   TSH 1.083 08/06/2016 0647   TSH 1.871 01/19/2016 2302   TSH 0.206 (L) 03/10/2011 1351     Ref. Range 09/11/2018 13:17  Vitamin D, 25-Hydroxy Latest Ref Range: 30.0 - 100.0 ng/mL 57.5    I, Doreene Nest, am acting as Location manager for Dennard Nip, MD I have reviewed the above documentation for accuracy and completeness,  and I agree with the above. -Dennard Nip, MD

## 2019-02-18 DIAGNOSIS — I4891 Unspecified atrial fibrillation: Secondary | ICD-10-CM | POA: Diagnosis not present

## 2019-02-18 DIAGNOSIS — I10 Essential (primary) hypertension: Secondary | ICD-10-CM | POA: Diagnosis not present

## 2019-02-18 DIAGNOSIS — Z7901 Long term (current) use of anticoagulants: Secondary | ICD-10-CM | POA: Diagnosis not present

## 2019-02-18 DIAGNOSIS — G35 Multiple sclerosis: Secondary | ICD-10-CM | POA: Diagnosis not present

## 2019-02-18 DIAGNOSIS — R21 Rash and other nonspecific skin eruption: Secondary | ICD-10-CM | POA: Diagnosis not present

## 2019-02-19 DIAGNOSIS — Z7901 Long term (current) use of anticoagulants: Secondary | ICD-10-CM | POA: Diagnosis not present

## 2019-02-19 DIAGNOSIS — I4891 Unspecified atrial fibrillation: Secondary | ICD-10-CM | POA: Diagnosis not present

## 2019-02-19 DIAGNOSIS — G35 Multiple sclerosis: Secondary | ICD-10-CM | POA: Diagnosis not present

## 2019-02-19 DIAGNOSIS — R21 Rash and other nonspecific skin eruption: Secondary | ICD-10-CM | POA: Diagnosis not present

## 2019-02-19 DIAGNOSIS — I10 Essential (primary) hypertension: Secondary | ICD-10-CM | POA: Diagnosis not present

## 2019-02-20 DIAGNOSIS — R21 Rash and other nonspecific skin eruption: Secondary | ICD-10-CM | POA: Diagnosis not present

## 2019-02-20 DIAGNOSIS — Z7901 Long term (current) use of anticoagulants: Secondary | ICD-10-CM | POA: Diagnosis not present

## 2019-02-20 DIAGNOSIS — I4891 Unspecified atrial fibrillation: Secondary | ICD-10-CM | POA: Diagnosis not present

## 2019-02-20 DIAGNOSIS — G35 Multiple sclerosis: Secondary | ICD-10-CM | POA: Diagnosis not present

## 2019-02-20 DIAGNOSIS — C642 Malignant neoplasm of left kidney, except renal pelvis: Secondary | ICD-10-CM | POA: Diagnosis not present

## 2019-02-20 DIAGNOSIS — I10 Essential (primary) hypertension: Secondary | ICD-10-CM | POA: Diagnosis not present

## 2019-02-24 ENCOUNTER — Ambulatory Visit (INDEPENDENT_AMBULATORY_CARE_PROVIDER_SITE_OTHER): Payer: Medicare Other | Admitting: Cardiology

## 2019-02-24 DIAGNOSIS — Z7901 Long term (current) use of anticoagulants: Secondary | ICD-10-CM | POA: Diagnosis not present

## 2019-02-24 DIAGNOSIS — I10 Essential (primary) hypertension: Secondary | ICD-10-CM | POA: Diagnosis not present

## 2019-02-24 DIAGNOSIS — R21 Rash and other nonspecific skin eruption: Secondary | ICD-10-CM | POA: Diagnosis not present

## 2019-02-24 DIAGNOSIS — G35 Multiple sclerosis: Secondary | ICD-10-CM | POA: Diagnosis not present

## 2019-02-24 DIAGNOSIS — I4891 Unspecified atrial fibrillation: Secondary | ICD-10-CM | POA: Diagnosis not present

## 2019-02-24 LAB — POCT INR: INR: 3.7 — AB (ref 2.0–3.0)

## 2019-02-25 DIAGNOSIS — N3289 Other specified disorders of bladder: Secondary | ICD-10-CM | POA: Diagnosis not present

## 2019-02-25 DIAGNOSIS — D49512 Neoplasm of unspecified behavior of left kidney: Secondary | ICD-10-CM | POA: Diagnosis not present

## 2019-02-26 DIAGNOSIS — I10 Essential (primary) hypertension: Secondary | ICD-10-CM | POA: Diagnosis not present

## 2019-02-26 DIAGNOSIS — G35 Multiple sclerosis: Secondary | ICD-10-CM | POA: Diagnosis not present

## 2019-02-26 DIAGNOSIS — Z7901 Long term (current) use of anticoagulants: Secondary | ICD-10-CM | POA: Diagnosis not present

## 2019-02-26 DIAGNOSIS — I4891 Unspecified atrial fibrillation: Secondary | ICD-10-CM | POA: Diagnosis not present

## 2019-02-26 DIAGNOSIS — R21 Rash and other nonspecific skin eruption: Secondary | ICD-10-CM | POA: Diagnosis not present

## 2019-02-28 ENCOUNTER — Telehealth: Payer: Self-pay | Admitting: Internal Medicine

## 2019-02-28 NOTE — Telephone Encounter (Signed)
Will forward to [employer]

## 2019-02-28 NOTE — Telephone Encounter (Signed)
Spoke with patient about 7/13 visit. She prefers in office visit but wants to r/s to later day. Moved appt to 7/20 @ 2:45pm

## 2019-02-28 NOTE — Telephone Encounter (Signed)
Patient called back returning [place] call about her appointment

## 2019-02-28 NOTE — Telephone Encounter (Signed)
Spoke to pt to change visit with Dr. Debara Pickett on Monday to a regular office visit. Pt stated she will call me back to let me know. She stated she came in for an INR check recently, but is trying not to go out very much. Explained to pt that I understand and will wait for her to call back with her decision. Pt verbalized thanks.

## 2019-03-03 ENCOUNTER — Telehealth: Payer: Medicare Other | Admitting: Internal Medicine

## 2019-03-03 ENCOUNTER — Ambulatory Visit (INDEPENDENT_AMBULATORY_CARE_PROVIDER_SITE_OTHER): Payer: Medicare Other | Admitting: Cardiology

## 2019-03-03 DIAGNOSIS — Z7901 Long term (current) use of anticoagulants: Secondary | ICD-10-CM | POA: Diagnosis not present

## 2019-03-03 DIAGNOSIS — G35 Multiple sclerosis: Secondary | ICD-10-CM | POA: Diagnosis not present

## 2019-03-03 DIAGNOSIS — R21 Rash and other nonspecific skin eruption: Secondary | ICD-10-CM | POA: Diagnosis not present

## 2019-03-03 DIAGNOSIS — I10 Essential (primary) hypertension: Secondary | ICD-10-CM | POA: Diagnosis not present

## 2019-03-03 DIAGNOSIS — I4891 Unspecified atrial fibrillation: Secondary | ICD-10-CM | POA: Diagnosis not present

## 2019-03-03 LAB — POCT INR: INR: 3.4 — AB (ref 2.0–3.0)

## 2019-03-04 DIAGNOSIS — G35 Multiple sclerosis: Secondary | ICD-10-CM | POA: Diagnosis not present

## 2019-03-04 DIAGNOSIS — I10 Essential (primary) hypertension: Secondary | ICD-10-CM | POA: Diagnosis not present

## 2019-03-04 DIAGNOSIS — I4891 Unspecified atrial fibrillation: Secondary | ICD-10-CM | POA: Diagnosis not present

## 2019-03-04 DIAGNOSIS — Z7901 Long term (current) use of anticoagulants: Secondary | ICD-10-CM | POA: Diagnosis not present

## 2019-03-04 DIAGNOSIS — C642 Malignant neoplasm of left kidney, except renal pelvis: Secondary | ICD-10-CM | POA: Diagnosis not present

## 2019-03-04 DIAGNOSIS — Z5181 Encounter for therapeutic drug level monitoring: Secondary | ICD-10-CM | POA: Diagnosis not present

## 2019-03-05 ENCOUNTER — Telehealth (INDEPENDENT_AMBULATORY_CARE_PROVIDER_SITE_OTHER): Payer: Medicare Other | Admitting: Family Medicine

## 2019-03-05 ENCOUNTER — Other Ambulatory Visit: Payer: Self-pay

## 2019-03-05 ENCOUNTER — Encounter (INDEPENDENT_AMBULATORY_CARE_PROVIDER_SITE_OTHER): Payer: Self-pay | Admitting: Family Medicine

## 2019-03-05 DIAGNOSIS — E038 Other specified hypothyroidism: Secondary | ICD-10-CM | POA: Diagnosis not present

## 2019-03-05 DIAGNOSIS — E7849 Other hyperlipidemia: Secondary | ICD-10-CM | POA: Diagnosis not present

## 2019-03-05 DIAGNOSIS — Z6834 Body mass index (BMI) 34.0-34.9, adult: Secondary | ICD-10-CM | POA: Diagnosis not present

## 2019-03-05 DIAGNOSIS — R945 Abnormal results of liver function studies: Secondary | ICD-10-CM | POA: Diagnosis not present

## 2019-03-05 DIAGNOSIS — R7303 Prediabetes: Secondary | ICD-10-CM

## 2019-03-05 DIAGNOSIS — R7989 Other specified abnormal findings of blood chemistry: Secondary | ICD-10-CM

## 2019-03-05 DIAGNOSIS — E669 Obesity, unspecified: Secondary | ICD-10-CM | POA: Diagnosis not present

## 2019-03-05 DIAGNOSIS — E559 Vitamin D deficiency, unspecified: Secondary | ICD-10-CM | POA: Diagnosis not present

## 2019-03-10 ENCOUNTER — Telehealth: Payer: Self-pay | Admitting: Internal Medicine

## 2019-03-10 ENCOUNTER — Encounter: Payer: Self-pay | Admitting: Internal Medicine

## 2019-03-10 ENCOUNTER — Ambulatory Visit (INDEPENDENT_AMBULATORY_CARE_PROVIDER_SITE_OTHER): Payer: Medicare Other | Admitting: *Deleted

## 2019-03-10 ENCOUNTER — Ambulatory Visit (INDEPENDENT_AMBULATORY_CARE_PROVIDER_SITE_OTHER): Payer: Medicare Other | Admitting: Internal Medicine

## 2019-03-10 ENCOUNTER — Other Ambulatory Visit: Payer: Self-pay

## 2019-03-10 VITALS — BP 121/60 | HR 101 | Temp 97.7°F | Ht 62.5 in | Wt 186.0 lb

## 2019-03-10 DIAGNOSIS — I4821 Permanent atrial fibrillation: Secondary | ICD-10-CM

## 2019-03-10 DIAGNOSIS — Z7901 Long term (current) use of anticoagulants: Secondary | ICD-10-CM

## 2019-03-10 DIAGNOSIS — R609 Edema, unspecified: Secondary | ICD-10-CM | POA: Diagnosis not present

## 2019-03-10 DIAGNOSIS — I872 Venous insufficiency (chronic) (peripheral): Secondary | ICD-10-CM

## 2019-03-10 DIAGNOSIS — I5032 Chronic diastolic (congestive) heart failure: Secondary | ICD-10-CM | POA: Diagnosis not present

## 2019-03-10 LAB — POCT INR: INR: 2.2 (ref 2.0–3.0)

## 2019-03-10 NOTE — Progress Notes (Signed)
Office: [phone]  /  Fax: [phone] TeleHealth Visit:  Toni [name] has verbally consented to this TeleHealth visit today. The patient is located at home, the provider is located at the [facility] office. The participants in this visit include the listed provider, patient, spouse and any and all parties involved. The visit was conducted today via FaceTime.  HPI:   Chief Complaint: OBESITY Toni [name] is here to discuss her progress with her obesity treatment plan. She is on the Category 1 plan and is following her eating plan approximately 90 % of the time. She states she is doing physical therapy and leg exercises when she can.  Toni [name] continue to do well with weight loss. She states she weighs approximately 185 pounds at home and she has lost almost 90 pounds from her heaviest weight. Hunger is controlled, and she is getting good support from her husband. We were unable to weigh the patient today for this TeleHealth visit. She feels as if she has maintained weight since her last visit. She has lost almost 90 lbs since starting treatment with [name].  Elevated LFT Toni [name] has done well with weight loss. Her last LFT was very slightly elevated. She denies abdominal pain.   Pre-Diabetes Toni [name] has a diagnosis of prediabetes based on her elevated Hgb A1c and was informed this puts her at greater risk of developing diabetes. Toni [name] is stable on diet, and she has done well with weight loss. Toni [name] continues to work on diet and exercise to decrease risk of diabetes. She denies nausea or hypoglycemia. Toni [name] is due for labs  Vitamin D deficiency Toni [name] has a diagnosis of vitamin D deficiency. Toni [name] is stable on vit D, and she denies nausea, vomiting or muscle weakness. Toni [name] is due for labs.  Hyperlipidemia Toni [name] has hyperlipidemia and she is on Simvastatin. She has been trying to improve her cholesterol levels with intensive lifestyle  modification including a low saturated fat diet, exercise and weight loss. She denies any chest pain or myalgias.  Hypothyroidism Toni [name] has a diagnosis of hypothyroidism. She is on synthroid 75 mcg. She denies hot or cold intolerance or palpitations.  ASSESSMENT AND PLAN:  Prediabetes - Plan: Comprehensive metabolic panel, Hemoglobin A1c, Insulin, random  Vitamin D deficiency - Plan: VITAMIN D 25 Hydroxy (Vit-D Deficiency, Fractures)  Other specified hypothyroidism - Plan: TSH, T4, free, T3  Other hyperlipidemia - Plan: Lipid Panel With LDL/HDL Ratio  LFT elevation - Plan: Comprehensive metabolic panel  Class 1 obesity with serious comorbidity and body mass index (BMI) of 34.0 to 34.9 in adult, unspecified obesity type  PLAN:  Elevated LFT We discussed the likely diagnosis of non alcoholic fatty liver disease today and how this condition is obesity related. Toni [name] was educated on her risk of developing NASH or even liver failure and the only proven treatment for NAFLD was weight loss. Toni [name] agreed to continue with her weight loss efforts with healthier diet and exercise as an essential part of her treatment plan. We will check labs and Toni [name] will follow up with our clinic in 2 weeks.  Pre-Diabetes Toni [name] will continue to work on weight loss, exercise, and decreasing simple carbohydrates in her diet to help decrease the risk of diabetes. She was informed that eating too many simple carbohydrates or too many calories at one sitting increases the likelihood of GI side effects. We will check labs and follow. Toni [name] agreed to follow up with [name] as directed to monitor her progress.  Vitamin D Deficiency  Toni [name] was informed that low vitamin D levels contributes to fatigue and are associated with obesity, breast, and colon cancer. She will continue OTC Vitamin D and will follow up for routine testing of vitamin D, at least 2-3 times per year. She was informed of the risk of  over-replacement of vitamin D and agrees to not increase her dose unless she discusses this with [name] first. We will check labs and follow.  Hyperlipidemia Toni [name] was informed of the American Heart Association Guidelines emphasizing intensive lifestyle modifications as the first line treatment for hyperlipidemia. We discussed many lifestyle modifications today in depth, and Toni [name] will continue to work on decreasing saturated fats such as fatty red meat, butter and many fried foods. She will also increase vegetables and lean protein in her diet and continue to work on exercise and weight loss efforts. We will check labs and follow.  Hypothyroidism Toni [name] was informed of the importance of good thyroid control to help with weight loss efforts. She was also informed that supertherapeutic thyroid levels are dangerous and will not improve weight loss results.  We will check labs and Toni [name] will continue synthroid for now.  Obesity Toni [name] is currently in the action stage of change. As such, her goal is to continue with weight loss efforts She has agreed to follow the Category 1 plan Toni [name] has been instructed to work up to a goal of 150 minutes of combined cardio and strengthening exercise per week for weight loss and overall health benefits. We discussed the following Behavioral Modification Strategies today: increase H2O intake, better snacking choices and avoiding temptations  Toni [name] has agreed to follow up with our clinic in 2 weeks. She was informed of the importance of frequent follow up visits to maximize her success with intensive lifestyle modifications for her multiple health conditions.  ALLERGIES: Allergies  Allergen Reactions  . Hydrocodone Nausea And Vomiting    Hydrocodone causes vomiting  . Oxycodone Nausea And Vomiting    Oral oxycodone causes vomiting  . Chlorhexidine Gluconate Rash    Sores at site  . Zoloft [Sertraline] Hives, Swelling and Rash     MEDICATIONS: Current Outpatient Medications on File Prior to Visit  Medication Sig Dispense Refill  . acetaminophen (TYLENOL) 500 MG tablet Take 500-1,000 mg by mouth every 6 (six) hours as needed for fever or headache (or pain).    Marland Kitchen ALPRAZolam (XANAX) 0.5 MG tablet Take 1 tablet (0.5 mg total) by mouth at bedtime as needed for anxiety or sleep. 30 tablet 0  . baclofen (LIORESAL) 20 MG tablet Once to two tablets a day for breakthrough spasms 180 tablet 0  . Biotin (PA BIOTIN) 1000 MCG tablet Take 1,000 mcg by mouth daily.    Marland Kitchen buPROPion (WELLBUTRIN XL) 300 MG 24 hr tablet Take 1 tablet (300 mg total) by mouth daily. 30 tablet 0  . CALCIUM CITRATE PO Take 2 tablets by mouth 2 (two) times daily.    . cetirizine (ZYRTEC) 10 MG tablet Take 10 mg by mouth daily.    . Cholecalciferol (VITAMIN D) 2000 units tablet Take 1 tablet (2,000 Units total) by mouth 2 (two) times daily.  0  . co-enzyme Q-10 30 MG capsule Take 30 mg by mouth daily.    . corticotropin (ACTHAR) 80 UNIT/ML injectable gel INJECT 80 UNITS (1ML)  SUBCUTANEOUSLY EVERY OTHER  DAY FOR A TOTAL OF 10 DAYS. REPEAT EVERY 30 DAYS.  (DISCARD 28 DAYS AFTER 1ST  USE) 5 mL 5  . Cranberry 1000 MG  CAPS Take by mouth.    . diltiazem (CARDIZEM CD) 180 MG 24 hr capsule TAKE 1 CAPSULE BY MOUTH  DAILY 90 capsule 3  . docusate sodium (COLACE) 100 MG capsule Take 1 capsule (100 mg total) by mouth daily. 10 capsule 0  . famotidine (PEPCID) 20 MG tablet TK 1 T PO D  6  . fluticasone (FLONASE) 50 MCG/ACT nasal spray Place 2 sprays into both nostrils daily as needed for allergies.     . furosemide (LASIX) 40 MG tablet Take 80 mg by mouth daily.     Marland Kitchen gabapentin (NEURONTIN) 300 MG capsule TAKE 1 CAPSULE BY MOUTH UP  TO 4 TIMES DAILY 360 capsule 3  . lactulose, encephalopathy, (GENERLAC) 10 GM/15ML SOLN Take 15 mLs (10 g total) by mouth 2 (two) times daily as needed. 2746 mL 0  . levothyroxine (SYNTHROID, LEVOTHROID) 75 MCG tablet Take 75 mcg by mouth daily.     Marland Kitchen LINZESS 145 MCG CAPS capsule TAKE 1 CAPSULE(145 MCG) BY MOUTH DAILY BEFORE BREAKFAST 30 capsule 2  . Multiple Vitamins-Minerals (CENTRUM SILVER PO) Take 1 tablet by mouth daily.    . nebivolol (BYSTOLIC) 10 MG tablet Take 20 mg by mouth daily.     . Omega-3 Fatty Acids (FISH OIL) 1000 MG CAPS Take 1,000 mg by mouth daily.    Marland Kitchen omeprazole (PRILOSEC) 20 MG capsule Take 20 mg by mouth daily.     . psyllium (METAMUCIL SMOOTH TEXTURE) 28 % packet Take 1 packet by mouth every other day.    . simvastatin (ZOCOR) 10 MG tablet     . SYRINGE/NEEDLE, DISP, 1 ML (BD LUER-LOK SYRINGE) 25G X 5/8" 1 ML MISC Use as directed to inject  Acthar 10 each prn  . Tamsulosin HCl (FLOMAX) 0.4 MG CAPS Take 0.4 mg by mouth daily.    Marland Kitchen UNABLE TO FIND Baclofen pump: Continuous    . warfarin (COUMADIN) 5 MG tablet TAKE 1 TABLET BY MOUTH  DAILY AS DIRECTED BY  COUMADIN CLINIC 90 tablet 1   Current Facility-Administered Medications on File Prior to Visit  Medication Dose Route Frequency Provider Last Rate Last Dose  . baclofen (LIORESAL) intrathecal injection 40 mg/26mL  40 mg Intrathecal Once Kathrynn Ducking, MD        PAST MEDICAL HISTORY: Past Medical History:  Diagnosis Date  . Abnormality of gait 03/01/2015  . Anxiety   . Arthritis    "knees" (01/19/2016)  . Atrial fibrillation with RVR ([place]) 01/19/2016  . Back pain   . Chest pain   . Childhood asthma   . Chronic pain    "nerve pain from the MS"  . Constipation   . Depression   . DVT (deep venous thrombosis) ([place]) 1983   "RLE; may have just been phlebitis; it was before the age of dopplers"  . 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 GERD (gastroesophageal reflux disease)   . History of stress test 06/21/10   limited exam with some degree of breast attenuatin of the apex however a component of apical ischemia is not excluded, EF 62%; cardiac cath   . HLD (hyperlipidemia)   . Hx of echocardiogram  04/22/10   EF 55-60%, no valve issues  . Hypertension   . Hypothyroidism   . Joint pain   . Migraine    "none since I stopped beta blocker in 1990s" (01/19/2016)  . Multiple sclerosis ([place])  has had this may 1989-[facility] reg doc  . OSA on CPAP   . Osteoarthritis   . PAF (paroxysmal atrial fibrillation) ([place])    on coumadin; documented on monitor 06/2010  . Pneumonia 11/2011  . Shortness of breath   . Thyroid disease     PAST SURGICAL HISTORY: Past Surgical History:  Procedure Laterality Date  . ANTERIOR CERVICAL DECOMP/DISCECTOMY FUSION  01/2004  . BACK SURGERY    . CARDIAC CATHETERIZATION  06/22/2010   normal coronary arteries, PAF  . CATARACT EXTRACTION, BILATERAL Bilateral   . CHILD BIRTHS     X2  . DILATION AND CURETTAGE OF UTERUS    . INFUSION PUMP IMPLANTATION  ~ 2009   baclofen infusion in lower abd  . PAIN PUMP REVISION N/A 07/29/2014   Procedure: Baclofen pump replacement;  Surgeon: Erline Levine, MD;  Location: [facility];  Service: Neurosurgery;  Laterality: N/A;  Baclofen pump replacement  . TONSILLECTOMY AND ADENOIDECTOMY    . VARICOSE VEIN SURGERY  ~ 1968  . VENOUS ABLATION  12/16/2010   radiofreq ablation -Dr Elisabeth Cara and [facility]  . WISDOM TOOTH EXTRACTION      SOCIAL HISTORY: Social History   Tobacco Use  . Smoking status: Former Smoker    Packs/day: 0.50    Years: 7.00    Pack years: 3.50    Types: Cigarettes    Quit date: 08/21/1976    Years since quitting: 42.5  . Smokeless tobacco: Never Used  Substance Use Topics  . Alcohol use: Yes    Alcohol/week: 1.0 standard drinks    Types: 1 Glasses of wine per week  . Drug use: No    FAMILY HISTORY: Family History  Problem Relation Age of Onset  . Coronary artery disease Father        at age 36  . Hyperlipidemia Father   . Thyroid disease Father   . Coronary artery disease Maternal Grandmother   . Depression Maternal Grandmother   . Cancer Paternal Grandmother   . Depression Mother   . Sudden  death Mother   . Anxiety disorder Mother   . Alcoholism Son     ROS: Review of Systems  Constitutional: Negative for weight loss.  Cardiovascular: Negative for chest pain and palpitations.  Gastrointestinal: Negative for abdominal pain and nausea.  Musculoskeletal: Negative for myalgias.  Endo/Heme/Allergies:       Negative for hypoglycemia Negative for hot or cold intolerance    PHYSICAL EXAM: Pt in no acute distress  RECENT LABS AND TESTS: BMET    Component Value Date/Time   NA 143 09/11/2018 1317   K 4.4 09/11/2018 1317   CL 106 09/11/2018 1317   CO2 23 09/11/2018 1317   GLUCOSE 114 (H) 09/11/2018 1317   GLUCOSE 92 07/08/2017 1214   BUN 25 09/11/2018 1317   CREATININE 0.68 09/11/2018 1317   CALCIUM 9.7 09/11/2018 1317   GFRNONAA 88 09/11/2018 1317   GFRAA 102 09/11/2018 1317   Lab Results  Component Value Date   HGBA1C 5.3 09/11/2018   HGBA1C 5.3 04/23/2018   HGBA1C 5.2 10/01/2017   HGBA1C 5.4 03/08/2017   HGBA1C  04/21/2010    5.5 (NOTE)  According to the ADA Clinical Practice Recommendations for 2011, when HbA1c is used as a screening test:   >=6.5%   Diagnostic of Diabetes Mellitus           (if abnormal result  is confirmed)  5.7-6.4%   Increased risk of developing Diabetes Mellitus  References:Diagnosis and Classification of Diabetes Mellitus,Diabetes ZOXW,9604,54(UJWJX 1):S62-S69 and Standards of Medical Care in         Diabetes - 2011,Diabetes BJYN,8295,62  (Suppl 1):S11-S61.   Lab Results  Component Value Date   INSULIN 13.0 09/11/2018   INSULIN 7.8 04/23/2018   INSULIN 20.4 11/27/2017   INSULIN 20.4 03/08/2017   CBC    Component Value Date/Time   WBC 8.9 07/11/2018   WBC 8.4 07/08/2017 1214   RBC 4.83 07/08/2017 1214   HGB 14.6 07/11/2018   HGB 14.1 03/08/2017 1152   HCT 45 07/11/2018   HCT 43.0 03/08/2017 1152   PLT 185 10/01/2017   PLT 219 12/18/2014 1157   MCV 93.2  07/08/2017 1214   MCV 93 03/08/2017 1152   MCH 31.5 07/08/2017 1214   MCHC 33.8 07/08/2017 1214   RDW 13.3 07/08/2017 1214   RDW 13.3 03/08/2017 1152   LYMPHSABS 1.6 07/08/2017 1214   LYMPHSABS 2.2 03/08/2017 1152   MONOABS 1.3 (H) 07/08/2017 1214   EOSABS 0.2 07/08/2017 1214   EOSABS 0.1 03/08/2017 1152   BASOSABS 0.0 07/08/2017 1214   BASOSABS 0.0 03/08/2017 1152   Iron/TIBC/Ferritin/ %Sat No results found for: IRON, TIBC, FERRITIN, IRONPCTSAT Lipid Panel     Component Value Date/Time   CHOL 147 07/11/2018   CHOL 129 11/27/2017 1011   TRIG 56 07/11/2018   HDL 53 07/11/2018   HDL 51 11/27/2017 1011   CHOLHDL 3.0 04/22/2010 0536   VLDL 29 04/22/2010 0536   LDLCALC 83 07/11/2018   LDLCALC 66 11/27/2017 1011   Hepatic Function Panel     Component Value Date/Time   PROT 6.6 09/11/2018 1317   ALBUMIN 4.6 09/11/2018 1317   AST 20 09/11/2018 1317   ALT 33 (H) 09/11/2018 1317   ALKPHOS 62 09/11/2018 1317   BILITOT 0.3 09/11/2018 1317      Component Value Date/Time   TSH 0.651 11/27/2017 1011   TSH 2.410 03/08/2017 1152   TSH 1.083 08/06/2016 0647   TSH 1.871 01/19/2016 2302   TSH 0.206 (L) 03/10/2011 1351     Ref. Range 09/11/2018 13:17  Vitamin D, 25-Hydroxy Latest Ref Range: 30.0 - 100.0 ng/mL 57.5    I, Doreene Nest, am acting as Location manager for Dennard Nip, MD I have reviewed the above documentation for accuracy and completeness, and I agree with the above. -Dennard Nip, MD

## 2019-03-10 NOTE — Patient Instructions (Addendum)
Description   Start taking 5mg  daily except 2.5mg  on Fridays.  Recheck in 10 days. Verita Lamb with f/u orders @ [phone] [place] nurse.

## 2019-03-10 NOTE — Progress Notes (Signed)
OFFICE NOTE  Chief Complaint:  Weight loss, occasional dizziness  Primary Care Physician: Prince Solian, MD  HPI:  Toni [name] is a 73 year old female with multiple sclerosis, paroxysmal atrial fibrillation, on Coumadin, morbid obesity, obstructive sleep apnea, and dyslipidemia. She has had worsening swelling in her legs, thought to be due to an MS flare. She has a history of varicose veins with severe venous insufficiency in both the right and left greater saphenous veins. The right greater saphenous vein was actually successfully ablated by my partner, Dr. Elisabeth Cara, about 1 to 2 years ago. She does have residual reflux in the left greater saphenous vein extending from the proximal calf up to the saphenofemoral junction. The vein measured between 4 and 8 mm with between 1 and 6 seconds of reflux. We had discussed ablation of that before, but she has canceled several times at procedures. At this point, I think there is little benefit to continuing to try for her to undergo ablation. 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 She can wear work showed a compression stockings and this does seem to help her. With regards to her atrial fibrillation she has not had significant recurrence to her knowledge. She continues on warfarin and has been therapeutic, with her INR of 1.9 in the office by fingerstick today.  Toni [name] returns today for followup. She reports her MS is somewhat progressive. She continues to have some enlargement of her legs but no significant swelling. There is a mild varicosities in the right lower extremity. She is unaware of her atrial fibrillation. INR today was 2.3.   I saw Toni [name] back in the office today,  She is describing more shortness of breath and feels that she's been in A. Fib more recently. In fact today6/21her EKG does show persistent atrial fibrillation. In the past she's been paroxysmal , but it may be that she is  more frequently having A. Fib. She has trouble telling whether not she is more short of breath or symptomatic from A. Fib versus her multiple sclerosis. She is also reporting more swelling in her left leg.  Toni [name] returns for follow-up today. She recently saw her neurologist regarding her MS which seems to be flaring. In addition she was noted to have significant apneic events despite using CPAP. There is concern for possible central sleep apnea. Both of these episodes are probably contributing to her recurrent atrial fibrillation. She's been wearing a monitor for a period of time which indicates a burden of A. fib of about 80% of the time. It does not appear that she is in persistent A. fib, rather she has had some episodes of sinus rhythm, but they're much less frequent than her A. Fib.  I saw Toni [name] back today in the office. Recently I saw her as an add-on visit briefly as she was describing some chest pain when she was getting her INR checked. An EKG at that time showed no ischemia. I did not feel that this was cardiac. She continues intermittently have abnormal pains both in her chest as well as her abdomen and extremities. This may be related to MS. She's had difficulty with CPAP and apparently getting a good mask fit. She has been a persistent atrial fibrillation for about 6 months. It's unclear whether or not she symptomatic with this and therefore I have not pursued a rhythm control strategy. Her rate control is generally pretty good and she has been  therapeutic on warfarin. She is reporting worsening swelling of her legs although her weight she says is been stable. She has a history of venous insufficiency and had a right greater saphenous vein ablation by one of my partners. She's concerned about more swelling in her left leg and whether she's developed worsening venous insufficiency. She also reports cold feet with numbness in her extremities. As she is basically wheelchair bound, I cannot  assess whether she is having any claudication.  Toni [name] returns today for follow-up. She underwent venous and arterial lower external Dopplers. Arterial Dopplers were normal which showed no evidence of obstruction. Her venous Doppler showed no DVT. There was significant venous reflux in the left greater saphenous vein. This is an area where she has swelling and discomfort. This may be amenable to treatment. I increased her Lasix and she reports some improvement in her leg swelling. She's trying to elevate her feet and reports she is intolerant of wearing compression stockings.  02/03/2016  Toni [name] was seen today in the office for follow-up of recent hospitalization. She was seeing her neurologist and complained of left chest and left arm pain and was referred to the hospital. She ruled out for MI and was felt that her chest pain was on coronary. She underwent an echocardiogram which showed low-normal EF but was also found to be in A. fib with rapid ventricular response. Her A. fib is persistent at this time but the rate was elevated. She was started on Cardizem which helped improve her rate and was discharged on Cardizem CD 120 mg daily. Heart rate today is better controlled in the 70s. She reports her arm pain is improved and she said today that she "did not feel that it was her heart".  05/09/2016  Toni [name] was seen in the office for follow-up today. She reports improved heart rate control with heart rates between 50 and 80 on diltiazem. Over this will help with her mild cardiomyopathy with EF 50-55% on recent echo. She reports some increased fluid gain particularly in her abdomen. This may be due to some degree of right heart dysfunction. She's working with her neurologist for MS as well as her sleep apnea. There is a component of central sleep apnea and she is reportedly going to have additional sleep study to look for the need for BiPAP therapy. She denies any recurrent chest pain or left arm  pain.  02/14/2017  Toni [name] was seen today in follow-up. She was recently hospitalized. She said problems with increasing fluid gain. I increased her Lasix last time however when she was hospitalized she required high-dose Lasix and says she has she felt better with that. She subsequently been put on BiPAP therapy. She says her sleeping is improved significantly. She is concerned though recently she's had some increased weight gain. She feels like she may need an increasing dose of diuretic. Her last echo was in December 2017 which showed an EF of 55%. This is been stable. If this is some heart failure, may be related diastolic dysfunction however suspect is more likely related to autonomic dysfunction the setting of multiple sclerosis.  03/29/2017  Toni [name] returns today for follow-up. She continues to report lower extremity edema and abdominal swelling. We'll increase her Lasix however she has not noted significant weight loss. It is actually very difficult to estimate her weight because of her MS. We tried to place her on the scale today however she was unable to stand long enough to get  an accurate weight. She reports urination was not as good on 40 mg of Lasix but is currently taking 80 mg once daily and has better urination with that.  05/18/2017  Toni [name] was seen today in follow-up. She reports some improvement in her abdominal swelling and bloating. Check she feels like she might be somewhat dry now. Her weight is down to 246 pounds from 253 pounds. She is also working with the weight loss clinic at [facility]. She is interested in decreasing her diuretic which is reasonable. She asked again about probably trying to get back into sinus rhythm however she's been in persistent A. fib which is long-standing at this point for more than one or 2 years. Her last echo in December 2017 showed mild left atrial enlargement. Is not clear that she's symptomatic with her A. fib although does feel tired at  times. She says she's had some improvement with her sleep apnea therapy.  01/15/2018  Toni [name] returns today for follow-up.  She is continued to lose some weight.  Weight is down now to 218 pounds, significant improvement from her prior weight over 250 pounds.  She also had marked reduction in edema.  Her blood pressure is low normal at the moment and may benefit from reduction in her blood pressure medications.  Her sleep apnea has improved significantly according to Dr. Brett Fairy, especially her obstructive symptoms.  She still has central sleep apnea.  In addition she has secondary progressive MS and recently had worsening symptoms associated with that.  Heart rate was elevated today with regards to A. fib however that did come down after short period of time.  She again appears to be asymptomatic with her A. fib.  The main reason not to pursue sinus rhythm is the fact that she has been persistently in A. fib for well over 5 years, and the likelihood of reestablishing sinus rhythm is exceedingly low.  05/16/2018  Toni [name] is seen today in follow-up.  Her weight is down even further today to 204.  She has had recently some positional dizziness and low blood pressures.  This is likely due to the significant weight loss and I would recommend a decrease in her medications.  She says that she is breathing better.  Her sleep apnea has improved significantly according to her sleep doctor and she has having less symptoms related to her MS.  She is in permanent A. fib with controlled ventricular response.  03/10/2019  Toni [name] returns today for follow-up.  She is done very well overall.  She is continued to lose weight with Dr. Migdalia Dk work.  Weight now 186 pounds down from 210 when I last saw her.  This is amazing since she is essentially sedentary due to her MS.  She reports reasonable control of her A. fib.  She has had some decrease in her PA P requirements with regards to sleep apnea.  Blood pressure is  well controlled.  She has recently had some swelling of the lower extremities and her abdomen.  She wonders if this could be some heart failure.  I suspect the lower extremity swelling is most likely due to venous insufficiency.  I previously performed a venous ablation on the right lower extremity and she still has some venous insufficiency of the left lower extremity however in the common femoral vein not amenable to ablation.  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   PMHx:  Past Medical History:  Diagnosis Date  Abnormality of gait 03/01/2015   Anxiety    Arthritis    "knees" (01/19/2016)   Atrial fibrillation with RVR ([place]) 01/19/2016   Back pain    Chest pain    Childhood asthma    Chronic pain    "nerve pain from the MS"   Constipation    Depression    DVT (deep venous thrombosis) ([place]) 1983   "RLE; may have just been phlebitis; it was before the age of dopplers"   Edema    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' ablation of R GSV 2012   GERD (gastroesophageal reflux disease)    History of stress test 06/21/10   limited exam with some degree of breast attenuatin of the apex however a component of apical ischemia is not excluded, EF 62%; cardiac cath    HLD (hyperlipidemia)    Hx of echocardiogram 04/22/10   EF 55-60%, no valve issues   Hypertension    Hypothyroidism    Joint pain    Migraine    "none since I stopped beta blocker in 1990s" (01/19/2016)   Multiple sclerosis ([place])    has had this may 1989-[facility] reg doc   OSA on CPAP    Osteoarthritis    PAF (paroxysmal atrial fibrillation) ([place])    on coumadin; documented on monitor 06/2010   Pneumonia 11/2011   Shortness of breath    Thyroid disease     Past Surgical History:  Procedure Laterality Date   ANTERIOR CERVICAL DECOMP/DISCECTOMY FUSION  01/2004   BACK SURGERY     CARDIAC CATHETERIZATION  06/22/2010   normal coronary arteries, PAF    CATARACT EXTRACTION, BILATERAL Bilateral    CHILD BIRTHS     X2   DILATION AND CURETTAGE OF UTERUS     INFUSION PUMP IMPLANTATION  ~ 2009   baclofen infusion in lower abd   PAIN PUMP REVISION N/A 07/29/2014   Procedure: Baclofen pump replacement;  Surgeon: Erline Levine, MD;  Location: [facility];  Service: Neurosurgery;  Laterality: N/A;  Baclofen pump replacement   TONSILLECTOMY AND ADENOIDECTOMY     VARICOSE VEIN SURGERY  ~ 1968   VENOUS ABLATION  12/16/2010   radiofreq ablation -Dr Elisabeth Cara and Javarious Elsayed   WISDOM TOOTH EXTRACTION      FAMHx:  Family History  Problem Relation Age of Onset   Coronary artery disease Father        at age 60   Hyperlipidemia Father    Thyroid disease Father    Coronary artery disease Maternal Grandmother    Depression Maternal Grandmother    Cancer Paternal Grandmother    Depression Mother    Sudden death Mother    Anxiety disorder Mother    Alcoholism Son     SOCHx:   reports that she quit smoking about 42 years ago. Her smoking use included cigarettes. She has a 3.50 pack-year smoking history. She has never used smokeless tobacco. She reports current alcohol use of about 1.0 standard drinks of alcohol per week. She reports that she does not use drugs.  ALLERGIES:  Allergies  Allergen Reactions   Hydrocodone Nausea And Vomiting    Hydrocodone causes vomiting   Oxycodone Nausea And Vomiting    Oral oxycodone causes vomiting   Chlorhexidine Gluconate Rash    Sores at site   Zoloft [Sertraline] Hives, Swelling and Rash    ROS: Pertinent items noted in HPI and remainder of comprehensive ROS otherwise negative.  HOME MEDS:  Current Outpatient Medications  Medication Sig Dispense Refill   acetaminophen (TYLENOL) 500 MG tablet Take 500-1,000 mg by mouth every 6 (six) hours as needed for fever or headache (or pain).     ALPRAZolam (XANAX) 0.5 MG tablet Take 1 tablet (0.5 mg total) by mouth at bedtime as needed for anxiety  or sleep. 30 tablet 0   baclofen (LIORESAL) 20 MG tablet Once to two tablets a day for breakthrough spasms 180 tablet 0   Biotin (PA BIOTIN) 1000 MCG tablet Take 1,000 mcg by mouth daily.     buPROPion (WELLBUTRIN XL) 300 MG 24 hr tablet Take 1 tablet (300 mg total) by mouth daily. 30 tablet 0   CALCIUM CITRATE PO Take 2 tablets by mouth 2 (two) times daily.     cetirizine (ZYRTEC) 10 MG tablet Take 10 mg by mouth daily.     Cholecalciferol (VITAMIN D) 2000 units tablet Take 1 tablet (2,000 Units total) by mouth 2 (two) times daily.  0   co-enzyme Q-10 30 MG capsule Take 30 mg by mouth daily.     corticotropin (ACTHAR) 80 UNIT/ML injectable gel INJECT 80 UNITS (1ML)  SUBCUTANEOUSLY EVERY OTHER  DAY FOR A TOTAL OF 10 DAYS. REPEAT EVERY 30 DAYS.  (DISCARD 28 DAYS AFTER 1ST  USE) 5 mL 5   Cranberry 1000 MG CAPS Take by mouth.     diltiazem (CARDIZEM CD) 180 MG 24 hr capsule TAKE 1 CAPSULE BY MOUTH  DAILY 90 capsule 3   docusate sodium (COLACE) 100 MG capsule Take 1 capsule (100 mg total) by mouth daily. 10 capsule 0   famotidine (PEPCID) 20 MG tablet TK 1 T PO D  6   fluticasone (FLONASE) 50 MCG/ACT nasal spray Place 2 sprays into both nostrils daily as needed for allergies.      furosemide (LASIX) 40 MG tablet Take 80 mg by mouth daily.      gabapentin (NEURONTIN) 300 MG capsule TAKE 1 CAPSULE BY MOUTH UP  TO 4 TIMES DAILY 360 capsule 3   lactulose, encephalopathy, (GENERLAC) 10 GM/15ML SOLN Take 15 mLs (10 g total) by mouth 2 (two) times daily as needed. 2746 mL 0   levothyroxine (SYNTHROID, LEVOTHROID) 75 MCG tablet Take 75 mcg by mouth daily.     LINZESS 145 MCG CAPS capsule TAKE 1 CAPSULE(145 MCG) BY MOUTH DAILY BEFORE BREAKFAST 30 capsule 2   Multiple Vitamins-Minerals (CENTRUM SILVER PO) Take 1 tablet by mouth daily.     nebivolol (BYSTOLIC) 10 MG tablet Take 20 mg by mouth daily.      Omega-3 Fatty Acids (FISH OIL) 1000 MG CAPS Take 1,000 mg by mouth daily.      omeprazole (PRILOSEC) 20 MG capsule Take 20 mg by mouth daily.      psyllium (METAMUCIL SMOOTH TEXTURE) 28 % packet Take 1 packet by mouth every other day.     simvastatin (ZOCOR) 10 MG tablet      SYRINGE/NEEDLE, DISP, 1 ML (BD LUER-LOK SYRINGE) 25G X 5/8" 1 ML MISC Use as directed to inject  Acthar 10 each prn   Tamsulosin HCl (FLOMAX) 0.4 MG CAPS Take 0.4 mg by mouth daily.     UNABLE TO FIND Baclofen pump: Continuous     warfarin (COUMADIN) 5 MG tablet TAKE 1 TABLET BY MOUTH  DAILY AS DIRECTED BY  COUMADIN CLINIC 90 tablet 1   Current Facility-Administered Medications  Medication Dose Route Frequency Provider Last Rate Last Dose   baclofen (LIORESAL) intrathecal injection 40  mg/68mL  40 mg Intrathecal Once Kathrynn Ducking, MD        LABS/IMAGING: No results found for this or any previous visit (from the past 48 hour(s)). No results found.  VITALS: BP 121/60    Pulse (!) 101    Temp 97.7 F (36.5 C)    Ht 5' 2.5" (1.588 m)    Wt 186 lb (84.4 kg)    SpO2 94%    BMI 33.48 kg/m   EXAM: General appearance: alert, no distress and moderately obese Neck: no carotid bruit, no JVD and thyroid not enlarged, symmetric, no tenderness/mass/nodules Lungs: clear to auscultation bilaterally Heart: irregularly irregular rhythm Abdomen: soft, non-tender; bowel sounds normal; no masses,  no organomegaly Extremities: edema Trace lower extremity Pulses: 2+ and symmetric Skin: Skin color, texture, turgor normal. No rashes or lesions Neurologic: Mental status: Alert, oriented, thought content appropriate Psych: Pleasant  EKG: A. fib with RVR 101-personally reviewed  ASSESSMENT: 1. Permanent atrial fibrillation - 80% frequency of A. Fib 2. Morbid obesity 3. Obstructive sleep apnea on BIPAP 4. Multiple sclerosis 5. Chronic anticoagulation on warfarin - INR 2.3 today 6. Chronic venous insufficiency - +LGSV reflux 7. Hypertension-controlled 8. Noncardiac chest and left arm  pain 9. Non-ischemic cardiomyopathy - EF 50%, improved to 55% (07/2016) 10. Acute on chronic diastolic congestive heart failure  PLAN: 1.   Toni [name] has had significant recent intentional weight loss.  This is good news.  Overall her blood pressure is improved.  I backed off on her beta-blocker last time and saw an improvement in blood pressure.  She is had some difficulty managing her INR recently.  She is requiring less BiPAP.  She has recently had some more swelling which I doubt is worsening heart failure however I would like to go ahead and get a repeat echo.  She is also contemplating surgery as she is had difficulty with her baclofen pump and it may need to be replaced by neurosurgery.  Pixie Casino, MD, [facility], [place] Director of the Advanced Lipid Disorders &  Cardiovascular Risk Reduction Clinic Diplomate of the American Board of Clinical Lipidology Attending Cardiologist  Direct Dial: [phone]   Fax: [phone]  Website:  [email or web address] 03/10/2019, 4:36 PM

## 2019-03-10 NOTE — Telephone Encounter (Signed)
LVM, reminding pt of her appt for 03-10-19 with Dr Debara Pickett.

## 2019-03-10 NOTE — Patient Instructions (Signed)
Medication Instructions:  Your physician recommends that you continue on your current medications as directed. Please refer to the Current Medication list given to you today.  If you need a refill on your cardiac medications before your next appointment, please call your pharmacy.   Testing/Procedures: Your physician has requested that you have an echocardiogram. Echocardiography is a painless test that uses sound waves to create images of your heart. It provides your doctor with information about the size and shape of your heart and how well your heart's chambers and valves are working. This procedure takes approximately one hour. There are no restrictions for this procedure. -- done at [address] - [address]  Follow-Up: At [facility], you and your health needs are our priority.  As part of our continuing mission to provide you with exceptional heart care, we have created designated Provider Care Teams.  These Care Teams include your primary Cardiologist (physician) and Advanced Practice Providers (APPs -  Physician Assistants and Nurse Practitioners) who all work together to provide you with the care you need, when you need it. You will need a follow up appointment in 6 months.  Please call our office 2 months in advance to schedule this appointment.  You may see Dr. Debara Pickett or one of the following Advanced Practice Providers on your designated Care Team: Almyra Deforest, [place] . Fabian Sharp, PA-C  Any Other Special Instructions Will Be Listed Below (If Applicable).

## 2019-03-11 DIAGNOSIS — G35 Multiple sclerosis: Secondary | ICD-10-CM | POA: Diagnosis not present

## 2019-03-11 DIAGNOSIS — I4891 Unspecified atrial fibrillation: Secondary | ICD-10-CM | POA: Diagnosis not present

## 2019-03-11 DIAGNOSIS — Z5181 Encounter for therapeutic drug level monitoring: Secondary | ICD-10-CM | POA: Diagnosis not present

## 2019-03-11 DIAGNOSIS — Z7901 Long term (current) use of anticoagulants: Secondary | ICD-10-CM | POA: Diagnosis not present

## 2019-03-11 DIAGNOSIS — I10 Essential (primary) hypertension: Secondary | ICD-10-CM | POA: Diagnosis not present

## 2019-03-13 DIAGNOSIS — G35 Multiple sclerosis: Secondary | ICD-10-CM | POA: Diagnosis not present

## 2019-03-13 DIAGNOSIS — C642 Malignant neoplasm of left kidney, except renal pelvis: Secondary | ICD-10-CM | POA: Diagnosis not present

## 2019-03-13 DIAGNOSIS — I480 Paroxysmal atrial fibrillation: Secondary | ICD-10-CM | POA: Diagnosis not present

## 2019-03-13 DIAGNOSIS — R7301 Impaired fasting glucose: Secondary | ICD-10-CM | POA: Diagnosis not present

## 2019-03-13 DIAGNOSIS — E669 Obesity, unspecified: Secondary | ICD-10-CM | POA: Diagnosis not present

## 2019-03-13 DIAGNOSIS — K219 Gastro-esophageal reflux disease without esophagitis: Secondary | ICD-10-CM | POA: Diagnosis not present

## 2019-03-13 DIAGNOSIS — I10 Essential (primary) hypertension: Secondary | ICD-10-CM | POA: Diagnosis not present

## 2019-03-13 DIAGNOSIS — K5900 Constipation, unspecified: Secondary | ICD-10-CM | POA: Diagnosis not present

## 2019-03-13 DIAGNOSIS — N3289 Other specified disorders of bladder: Secondary | ICD-10-CM | POA: Diagnosis not present

## 2019-03-13 DIAGNOSIS — G4733 Obstructive sleep apnea (adult) (pediatric): Secondary | ICD-10-CM | POA: Diagnosis not present

## 2019-03-13 DIAGNOSIS — I872 Venous insufficiency (chronic) (peripheral): Secondary | ICD-10-CM | POA: Diagnosis not present

## 2019-03-13 DIAGNOSIS — L8991 Pressure ulcer of unspecified site, stage 1: Secondary | ICD-10-CM | POA: Diagnosis not present

## 2019-03-14 DIAGNOSIS — Z7901 Long term (current) use of anticoagulants: Secondary | ICD-10-CM | POA: Diagnosis not present

## 2019-03-14 DIAGNOSIS — G35 Multiple sclerosis: Secondary | ICD-10-CM | POA: Diagnosis not present

## 2019-03-14 DIAGNOSIS — N390 Urinary tract infection, site not specified: Secondary | ICD-10-CM | POA: Diagnosis not present

## 2019-03-14 DIAGNOSIS — I4891 Unspecified atrial fibrillation: Secondary | ICD-10-CM | POA: Diagnosis not present

## 2019-03-14 DIAGNOSIS — Z5181 Encounter for therapeutic drug level monitoring: Secondary | ICD-10-CM | POA: Diagnosis not present

## 2019-03-14 DIAGNOSIS — I10 Essential (primary) hypertension: Secondary | ICD-10-CM | POA: Diagnosis not present

## 2019-03-16 ENCOUNTER — Other Ambulatory Visit: Payer: Self-pay | Admitting: Neurology

## 2019-03-17 DIAGNOSIS — I10 Essential (primary) hypertension: Secondary | ICD-10-CM | POA: Diagnosis not present

## 2019-03-17 DIAGNOSIS — G35 Multiple sclerosis: Secondary | ICD-10-CM | POA: Diagnosis not present

## 2019-03-17 DIAGNOSIS — Z5181 Encounter for therapeutic drug level monitoring: Secondary | ICD-10-CM | POA: Diagnosis not present

## 2019-03-17 DIAGNOSIS — Z7901 Long term (current) use of anticoagulants: Secondary | ICD-10-CM | POA: Diagnosis not present

## 2019-03-17 DIAGNOSIS — I4891 Unspecified atrial fibrillation: Secondary | ICD-10-CM | POA: Diagnosis not present

## 2019-03-18 ENCOUNTER — Ambulatory Visit (HOSPITAL_COMMUNITY): Payer: Medicare Other

## 2019-03-19 DIAGNOSIS — R945 Abnormal results of liver function studies: Secondary | ICD-10-CM | POA: Diagnosis not present

## 2019-03-19 DIAGNOSIS — E559 Vitamin D deficiency, unspecified: Secondary | ICD-10-CM | POA: Diagnosis not present

## 2019-03-19 DIAGNOSIS — R7303 Prediabetes: Secondary | ICD-10-CM | POA: Diagnosis not present

## 2019-03-19 DIAGNOSIS — E7849 Other hyperlipidemia: Secondary | ICD-10-CM | POA: Diagnosis not present

## 2019-03-19 DIAGNOSIS — E038 Other specified hypothyroidism: Secondary | ICD-10-CM | POA: Diagnosis not present

## 2019-03-20 ENCOUNTER — Ambulatory Visit (INDEPENDENT_AMBULATORY_CARE_PROVIDER_SITE_OTHER): Payer: Medicare Other | Admitting: Pharmacist Clinician (PhC)/ Clinical Pharmacy Specialist

## 2019-03-20 DIAGNOSIS — I4891 Unspecified atrial fibrillation: Secondary | ICD-10-CM

## 2019-03-20 DIAGNOSIS — I10 Essential (primary) hypertension: Secondary | ICD-10-CM | POA: Diagnosis not present

## 2019-03-20 DIAGNOSIS — G35 Multiple sclerosis: Secondary | ICD-10-CM | POA: Diagnosis not present

## 2019-03-20 DIAGNOSIS — Z7901 Long term (current) use of anticoagulants: Secondary | ICD-10-CM | POA: Diagnosis not present

## 2019-03-20 DIAGNOSIS — Z5181 Encounter for therapeutic drug level monitoring: Secondary | ICD-10-CM | POA: Diagnosis not present

## 2019-03-20 LAB — LIPID PANEL WITH LDL/HDL RATIO
Cholesterol, Total: 111 mg/dL (ref 100–199)
HDL: 57 mg/dL (ref >39)
LDL Calculated: 43 mg/dL (ref 0–99)
LDl/HDL Ratio: 0.8 ratio (ref 0.0–3.2)
Triglycerides: 53 mg/dL (ref 0–149)
VLDL Cholesterol Cal: 11 mg/dL (ref 5–40)

## 2019-03-20 LAB — COMPREHENSIVE METABOLIC PANEL
ALT: 22 IU/L (ref 0–32)
AST: 12 IU/L (ref 0–40)
Albumin/Globulin Ratio: 2.1 (ref 1.2–2.2)
Albumin: 3.7 g/dL (ref 3.7–4.7)
Alkaline Phosphatase: 48 IU/L (ref 39–117)
BUN/Creatinine Ratio: 38 — ABNORMAL HIGH (ref 12–28)
BUN: 24 mg/dL (ref 8–27)
Bilirubin Total: 0.2 mg/dL (ref 0.0–1.2)
CO2: 25 mmol/L (ref 20–29)
Calcium: 9 mg/dL (ref 8.7–10.3)
Chloride: 105 mmol/L (ref 96–106)
Creatinine, Ser: 0.64 mg/dL (ref 0.57–1.00)
GFR calc Af Amer: 104 mL/min/{1.73_m2} (ref >59)
GFR calc non Af Amer: 90 mL/min/{1.73_m2} (ref >59)
Globulin, Total: 1.8 g/dL (ref 1.5–4.5)
Glucose: 78 mg/dL (ref 65–99)
Potassium: 4.2 mmol/L (ref 3.5–5.2)
Sodium: 145 mmol/L — ABNORMAL HIGH (ref 134–144)
Total Protein: 5.5 g/dL — ABNORMAL LOW (ref 6.0–8.5)

## 2019-03-20 LAB — POCT INR: INR: 3.1 — AB (ref 2.0–3.0)

## 2019-03-20 LAB — T4, FREE: Free T4: 1.86 ng/dL — ABNORMAL HIGH (ref 0.82–1.77)

## 2019-03-20 LAB — TSH: TSH: 2.06 u[IU]/mL (ref 0.450–4.500)

## 2019-03-20 LAB — HEMOGLOBIN A1C
Est. average glucose Bld gHb Est-mCnc: 97 mg/dL
Hgb A1c MFr Bld: 5 % (ref 4.8–5.6)

## 2019-03-20 LAB — VITAMIN D 25 HYDROXY (VIT D DEFICIENCY, FRACTURES): Vit D, 25-Hydroxy: 67.7 ng/mL (ref 30.0–100.0)

## 2019-03-20 LAB — T3: T3, Total: 84 ng/dL (ref 71–180)

## 2019-03-20 LAB — INSULIN, RANDOM: INSULIN: 7.1 u[IU]/mL (ref 2.6–24.9)

## 2019-03-24 ENCOUNTER — Ambulatory Visit (INDEPENDENT_AMBULATORY_CARE_PROVIDER_SITE_OTHER): Payer: Medicare Other | Admitting: Cardiology

## 2019-03-24 DIAGNOSIS — I10 Essential (primary) hypertension: Secondary | ICD-10-CM | POA: Diagnosis not present

## 2019-03-24 DIAGNOSIS — G35 Multiple sclerosis: Secondary | ICD-10-CM | POA: Diagnosis not present

## 2019-03-24 DIAGNOSIS — Z7901 Long term (current) use of anticoagulants: Secondary | ICD-10-CM | POA: Diagnosis not present

## 2019-03-24 DIAGNOSIS — Z5181 Encounter for therapeutic drug level monitoring: Secondary | ICD-10-CM | POA: Diagnosis not present

## 2019-03-24 DIAGNOSIS — I4891 Unspecified atrial fibrillation: Secondary | ICD-10-CM | POA: Diagnosis not present

## 2019-03-24 LAB — POCT INR: INR: 4.4 — AB (ref 2.0–3.0)

## 2019-03-25 ENCOUNTER — Other Ambulatory Visit: Payer: Self-pay

## 2019-03-25 ENCOUNTER — Ambulatory Visit (HOSPITAL_COMMUNITY): Payer: Medicare Other | Attending: Cardiovascular Disease

## 2019-03-25 DIAGNOSIS — R609 Edema, unspecified: Secondary | ICD-10-CM | POA: Diagnosis not present

## 2019-03-25 DIAGNOSIS — I4821 Permanent atrial fibrillation: Secondary | ICD-10-CM | POA: Diagnosis not present

## 2019-03-26 ENCOUNTER — Telehealth (INDEPENDENT_AMBULATORY_CARE_PROVIDER_SITE_OTHER): Payer: Medicare Other | Admitting: Family Medicine

## 2019-03-26 DIAGNOSIS — I959 Hypotension, unspecified: Secondary | ICD-10-CM | POA: Diagnosis not present

## 2019-03-26 DIAGNOSIS — D1739 Benign lipomatous neoplasm of skin and subcutaneous tissue of other sites: Secondary | ICD-10-CM | POA: Diagnosis not present

## 2019-03-26 DIAGNOSIS — I10 Essential (primary) hypertension: Secondary | ICD-10-CM | POA: Diagnosis not present

## 2019-03-27 ENCOUNTER — Telehealth: Payer: Self-pay | Admitting: Neurology

## 2019-03-27 ENCOUNTER — Other Ambulatory Visit: Payer: Self-pay

## 2019-03-27 ENCOUNTER — Ambulatory Visit (INDEPENDENT_AMBULATORY_CARE_PROVIDER_SITE_OTHER): Payer: Medicare Other | Admitting: Neurology

## 2019-03-27 ENCOUNTER — Encounter: Payer: Self-pay | Admitting: Neurology

## 2019-03-27 VITALS — BP 134/64 | HR 72 | Temp 98.0°F

## 2019-03-27 DIAGNOSIS — G35 Multiple sclerosis: Secondary | ICD-10-CM

## 2019-03-27 MED ORDER — BACLOFEN 40 MG/20ML IT SOLN
80.0000 mg | Freq: Once | INTRATHECAL | Status: AC
  Administered 2019-03-27: 11:00:00 80 mg via INTRATHECAL

## 2019-03-27 NOTE — Telephone Encounter (Signed)
Pt needs apt for baclofen pump refill before Nov. 12. No available slots. Best call back [phone]

## 2019-03-27 NOTE — Progress Notes (Signed)
The patient comes in today for a baclofen pump refill.

## 2019-03-27 NOTE — Procedures (Signed)
     History:  Toni [name] is a 73 year old patient with a history of multiple sclerosis with a spastic paraparesis.  Patient has a baclofen pump in place and she comes in for baclofen pump refill today.  She has noted some increase in spasticity over the last several months.  Baclofen pump refill note  The baclofen pump site was cleaned with Betadine solution. A 21-gauge needle was inserted into the pump port site. Approximately 7.0 cc of residual baclofen was removed the baclofen pump indicates a 2.6 cc residual. 40 cc of replacement baclofen was placed into the pump at 2000 mcg/cc concentration.  The pump was reprogrammed for the following settings: The pump was increased from a simple continuous rate of 750.9 mcg daily to a simple continuous rate of 70.6 mcg daily, a 4% increase.  The alarm volume is set at 1.5 cc. The next alarm date is July 03, 2019.  The patient tolerated the procedure well. There were no complications of the above procedure.  The [place] number is [phone] The baclofen expiration date is January 2022. The baclofen lot number is [id number].

## 2019-03-28 DIAGNOSIS — I4891 Unspecified atrial fibrillation: Secondary | ICD-10-CM | POA: Diagnosis not present

## 2019-03-28 DIAGNOSIS — Z5181 Encounter for therapeutic drug level monitoring: Secondary | ICD-10-CM | POA: Diagnosis not present

## 2019-03-28 DIAGNOSIS — I10 Essential (primary) hypertension: Secondary | ICD-10-CM | POA: Diagnosis not present

## 2019-03-28 DIAGNOSIS — G35 Multiple sclerosis: Secondary | ICD-10-CM | POA: Diagnosis not present

## 2019-03-28 DIAGNOSIS — Z7901 Long term (current) use of anticoagulants: Secondary | ICD-10-CM | POA: Diagnosis not present

## 2019-03-31 ENCOUNTER — Ambulatory Visit (INDEPENDENT_AMBULATORY_CARE_PROVIDER_SITE_OTHER): Payer: Medicare Other | Admitting: Pharmacist

## 2019-03-31 DIAGNOSIS — Z7901 Long term (current) use of anticoagulants: Secondary | ICD-10-CM | POA: Diagnosis not present

## 2019-03-31 DIAGNOSIS — I4891 Unspecified atrial fibrillation: Secondary | ICD-10-CM | POA: Diagnosis not present

## 2019-03-31 DIAGNOSIS — Z5181 Encounter for therapeutic drug level monitoring: Secondary | ICD-10-CM | POA: Diagnosis not present

## 2019-03-31 DIAGNOSIS — I10 Essential (primary) hypertension: Secondary | ICD-10-CM | POA: Diagnosis not present

## 2019-03-31 DIAGNOSIS — G35 Multiple sclerosis: Secondary | ICD-10-CM | POA: Diagnosis not present

## 2019-03-31 LAB — POCT INR: INR: 2.5 (ref 2.0–3.0)

## 2019-03-31 NOTE — Telephone Encounter (Signed)
Pt returned call and confirmed that the time and date that was scheduled is good for her.

## 2019-03-31 NOTE — Telephone Encounter (Signed)
I contacted the pt and lvm a vm asking her to call me back. I have scheduled a baclofen pump refill for 07/02/2019 11 am. When pt calls back, please confirm if this appointment date/time works.

## 2019-03-31 NOTE — Telephone Encounter (Signed)
Noted  

## 2019-04-01 DIAGNOSIS — Z7901 Long term (current) use of anticoagulants: Secondary | ICD-10-CM | POA: Diagnosis not present

## 2019-04-01 DIAGNOSIS — I10 Essential (primary) hypertension: Secondary | ICD-10-CM | POA: Diagnosis not present

## 2019-04-01 DIAGNOSIS — G35 Multiple sclerosis: Secondary | ICD-10-CM | POA: Diagnosis not present

## 2019-04-01 DIAGNOSIS — Z5181 Encounter for therapeutic drug level monitoring: Secondary | ICD-10-CM | POA: Diagnosis not present

## 2019-04-01 DIAGNOSIS — I4891 Unspecified atrial fibrillation: Secondary | ICD-10-CM | POA: Diagnosis not present

## 2019-04-02 DIAGNOSIS — H04123 Dry eye syndrome of bilateral lacrimal glands: Secondary | ICD-10-CM | POA: Diagnosis not present

## 2019-04-02 DIAGNOSIS — Z961 Presence of intraocular lens: Secondary | ICD-10-CM | POA: Diagnosis not present

## 2019-04-03 ENCOUNTER — Other Ambulatory Visit: Payer: Self-pay

## 2019-04-03 ENCOUNTER — Encounter (INDEPENDENT_AMBULATORY_CARE_PROVIDER_SITE_OTHER): Payer: Self-pay | Admitting: Family Medicine

## 2019-04-03 ENCOUNTER — Telehealth (INDEPENDENT_AMBULATORY_CARE_PROVIDER_SITE_OTHER): Payer: Medicare Other | Admitting: Family Medicine

## 2019-04-03 DIAGNOSIS — E669 Obesity, unspecified: Secondary | ICD-10-CM | POA: Diagnosis not present

## 2019-04-03 DIAGNOSIS — E7849 Other hyperlipidemia: Secondary | ICD-10-CM | POA: Diagnosis not present

## 2019-04-03 DIAGNOSIS — I4891 Unspecified atrial fibrillation: Secondary | ICD-10-CM | POA: Diagnosis not present

## 2019-04-03 DIAGNOSIS — Z6834 Body mass index (BMI) 34.0-34.9, adult: Secondary | ICD-10-CM | POA: Diagnosis not present

## 2019-04-03 DIAGNOSIS — G35 Multiple sclerosis: Secondary | ICD-10-CM | POA: Diagnosis not present

## 2019-04-03 DIAGNOSIS — Z7901 Long term (current) use of anticoagulants: Secondary | ICD-10-CM | POA: Diagnosis not present

## 2019-04-03 DIAGNOSIS — Z5181 Encounter for therapeutic drug level monitoring: Secondary | ICD-10-CM | POA: Diagnosis not present

## 2019-04-03 DIAGNOSIS — I10 Essential (primary) hypertension: Secondary | ICD-10-CM | POA: Diagnosis not present

## 2019-04-07 ENCOUNTER — Ambulatory Visit (INDEPENDENT_AMBULATORY_CARE_PROVIDER_SITE_OTHER): Payer: Medicare Other | Admitting: Internal Medicine

## 2019-04-07 DIAGNOSIS — G35 Multiple sclerosis: Secondary | ICD-10-CM | POA: Diagnosis not present

## 2019-04-07 DIAGNOSIS — I10 Essential (primary) hypertension: Secondary | ICD-10-CM | POA: Diagnosis not present

## 2019-04-07 DIAGNOSIS — Z7901 Long term (current) use of anticoagulants: Secondary | ICD-10-CM

## 2019-04-07 DIAGNOSIS — Z5181 Encounter for therapeutic drug level monitoring: Secondary | ICD-10-CM | POA: Diagnosis not present

## 2019-04-07 DIAGNOSIS — I4891 Unspecified atrial fibrillation: Secondary | ICD-10-CM | POA: Diagnosis not present

## 2019-04-07 LAB — POCT INR: INR: 2.1 (ref 2.0–3.0)

## 2019-04-07 NOTE — Patient Instructions (Signed)
Description   Restart taking 5mg  daily except 2.5mg  on Fridays.  Recheck in 2 weeks.

## 2019-04-09 NOTE — Progress Notes (Signed)
Office: [phone]  /  Fax: [phone] TeleHealth Visit:  Toni [name] has verbally consented to this TeleHealth visit today. The patient is located at home, the provider is located at the [facility] office. The participants in this visit include the listed provider and patient. The visit was conducted today via face time.  HPI:   Chief Complaint: OBESITY Toni [name] is here to discuss her progress with her obesity treatment plan. She is on the Category 1 plan and is following her eating plan approximately 85-90 % of the time. She states she is doing physical therapy 2 times per week for 60 minutes and leg exercises 7 times per week for 20-30 minutes, and walking some, 10-15 steps. Toni [name] continues to do well with following her eating plan. She thinks she has maintained her weight. She is doing physical therapy 2 times a week and home exercises daily.  We were unable to weigh the patient today for this TeleHealth visit. She feels as if she has maintained her weight since her last visit. She has lost 90 lbs since starting treatment with [name].  Hypertension Toni [name] is a 73 y.o. female with hypertension. Toni [name]'s blood pressure was low at her primary care physician office, and her Bystolic was decreased from 20 mg to 10 mg. Her blood pressure this morning was 122/62. She denies chest pain. She is working on weight loss to help control her blood pressure with the goal of decreasing her risk of heart attack and stroke. Toni [name]'s blood pressure is currently controlled.  Hyperlipidemia Toni [name] has hyperlipidemia and has been trying to improve her cholesterol levels with intensive lifestyle modification including a low saturated fat diet, exercise and weight loss. She continues to do well with medications and diet. Labs were discussed today, and were very good. She denies any chest pain, claudication or myalgias.  ASSESSMENT AND PLAN:  Essential hypertension   Other hyperlipidemia  Class 1 obesity with serious comorbidity and body mass index (BMI) of 34.0 to 34.9 in adult, unspecified obesity type  PLAN:  Hypertension We discussed sodium restriction, working on healthy weight loss, and a regular exercise program as the means to achieve improved blood pressure control. Toni [name] agreed with this plan and agreed to follow up as directed. We will continue to monitor her blood pressure as well as her progress with the above lifestyle modifications. Toni [name] agrees to continue taking Bystolic at 10 mg and increase [name] intake. She will watch for signs of hypotension as she continues her lifestyle modifications. Toni [name] agrees to follow up with our clinic in 3 weeks.  Hyperlipidemia Toni [name] was informed of the American Heart Association Guidelines emphasizing intensive lifestyle modifications as the first line treatment for hyperlipidemia. We discussed many lifestyle modifications today in depth, and Toni [name] will continue to work on decreasing saturated fats such as fatty red meat, butter and many fried foods. She will also increase vegetables and lean protein in her diet. She will continue with diet, exercise, and medications, and will continue to monitor. Toni [name] agrees to follow up with our clinic in 3 weeks.  I spent > than 50% of the 25 minute visit on counseling as documented in the note.  Obesity Toni [name] is currently in the action stage of change. As such, her goal is to continue with weight loss efforts She has agreed to follow the Category 1 plan Toni [name] has been instructed to work up to a goal of 150 minutes of combined cardio and strengthening exercise per week  for weight loss and overall health benefits. We discussed the following Behavioral Modification Strategies today: decreasing simple carbohydrates and increase [name] intake   Toni [name] has agreed to follow up with our clinic in 3 weeks. She was informed of the importance of  frequent follow up visits to maximize her success with intensive lifestyle modifications for her multiple health conditions.  ALLERGIES: Allergies  Allergen Reactions  . Hydrocodone Nausea And Vomiting    Hydrocodone causes vomiting  . Oxycodone Nausea And Vomiting    Oral oxycodone causes vomiting  . Chlorhexidine Gluconate Rash    Sores at site  . Zoloft [Sertraline] Hives, Swelling and Rash    MEDICATIONS: Current Outpatient Medications on File Prior to Visit  Medication Sig Dispense Refill  . acetaminophen (TYLENOL) 500 MG tablet Take 500-1,000 mg by mouth every 6 (six) hours as needed for fever or headache (or pain).    Marland Kitchen ALPRAZolam (XANAX) 0.5 MG tablet Take 1 tablet (0.5 mg total) by mouth at bedtime as needed for anxiety or sleep. 30 tablet 0  . baclofen (LIORESAL) 20 MG tablet Once to two tablets a day for breakthrough spasms 180 tablet 0  . Biotin (PA BIOTIN) 1000 MCG tablet Take 1,000 mcg by mouth daily.    Marland Kitchen buPROPion (WELLBUTRIN XL) 300 MG 24 hr tablet Take 1 tablet (300 mg total) by mouth daily. 30 tablet 0  . CALCIUM CITRATE PO Take 2 tablets by mouth 2 (two) times daily.    . cetirizine (ZYRTEC) 10 MG tablet Take 10 mg by mouth daily.    . Cholecalciferol (VITAMIN D) 2000 units tablet Take 1 tablet (2,000 Units total) by mouth 2 (two) times daily.  0  . co-enzyme Q-10 30 MG capsule Take 30 mg by mouth daily.    . corticotropin (ACTHAR) 80 UNIT/ML injectable gel INJECT 80 UNITS (1ML)  SUBCUTANEOUSLY EVERY OTHER  DAY FOR A TOTAL OF 10 DAYS. REPEAT EVERY 30 DAYS.  (DISCARD 28 DAYS AFTER 1ST  USE) 5 mL 5  . Cranberry 1000 MG CAPS Take by mouth.    . diltiazem (CARDIZEM CD) 180 MG 24 hr capsule TAKE 1 CAPSULE BY MOUTH  DAILY 90 capsule 3  . docusate sodium (COLACE) 100 MG capsule Take 1 capsule (100 mg total) by mouth daily. 10 capsule 0  . famotidine (PEPCID) 20 MG tablet TK 1 T PO D  6  . fluticasone (FLONASE) 50 MCG/ACT nasal spray Place 2 sprays into both nostrils daily  as needed for allergies.     . furosemide (LASIX) 40 MG tablet Take 80 mg by mouth daily.     Marland Kitchen gabapentin (NEURONTIN) 300 MG capsule TAKE 1 CAPSULE BY MOUTH UP  TO 4 TIMES DAILY 360 capsule 3  . GENERLAC 10 GM/15ML SOLN TAKE 15 ML BY MOUTH TWICE DAILY AS NEEDED 946 mL 0  . levothyroxine (SYNTHROID, LEVOTHROID) 75 MCG tablet Take 75 mcg by mouth daily.    Marland Kitchen LINZESS 145 MCG CAPS capsule TAKE 1 CAPSULE(145 MCG) BY MOUTH DAILY BEFORE BREAKFAST 30 capsule 2  . Multiple Vitamins-Minerals (CENTRUM SILVER PO) Take 1 tablet by mouth daily.    . nebivolol (BYSTOLIC) 10 MG tablet Take 10 mg by mouth daily. Take 10mg  daily unless bp systolic is below 496 then take 5mg     . Omega-3 Fatty Acids (FISH OIL) 1000 MG CAPS Take 1,000 mg by mouth daily.    Marland Kitchen omeprazole (PRILOSEC) 20 MG capsule Take 20 mg by mouth daily.     Marland Kitchen  psyllium (METAMUCIL SMOOTH TEXTURE) 28 % packet Take 1 packet by mouth every other day.    . simvastatin (ZOCOR) 10 MG tablet     . SYRINGE/NEEDLE, DISP, 1 ML (BD LUER-LOK SYRINGE) 25G X 5/8" 1 ML MISC Use as directed to inject  Acthar 10 each prn  . Tamsulosin HCl (FLOMAX) 0.4 MG CAPS Take 0.4 mg by mouth daily.    Marland Kitchen UNABLE TO FIND Baclofen pump: Continuous    . warfarin (COUMADIN) 5 MG tablet TAKE 1 TABLET BY MOUTH  DAILY AS DIRECTED BY  COUMADIN CLINIC 90 tablet 1   Current Facility-Administered Medications on File Prior to Visit  Medication Dose Route Frequency Provider Last Rate Last Dose  . baclofen (LIORESAL) intrathecal injection 40 mg/73mL  40 mg Intrathecal Once Kathrynn Ducking, MD        PAST MEDICAL HISTORY: Past Medical History:  Diagnosis Date  . Abnormality of gait 03/01/2015  . Anxiety   . Arthritis    "knees" (01/19/2016)  . Atrial fibrillation with RVR ([place]) 01/19/2016  . Back pain   . Chest pain   . Childhood asthma   . Chronic pain    "nerve pain from the MS"  . Constipation   . Depression   . DVT (deep venous thrombosis) ([place]) 1983   "RLE; may have just  been phlebitis; it was before the age of dopplers"  . 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 GERD (gastroesophageal reflux disease)   . History of stress test 06/21/10   limited exam with some degree of breast attenuatin of the apex however a component of apical ischemia is not excluded, EF 62%; cardiac cath   . HLD (hyperlipidemia)   . Hx of echocardiogram 04/22/10   EF 55-60%, no valve issues  . Hypertension   . Hypothyroidism   . Joint pain   . Migraine    "none since I stopped beta blocker in 1990s" (01/19/2016)  . Multiple sclerosis ([place])    has had this may 1989-[facility] reg doc  . OSA on CPAP   . Osteoarthritis   . PAF (paroxysmal atrial fibrillation) ([place])    on coumadin; documented on monitor 06/2010  . Pneumonia 11/2011  . Shortness of breath   . Thyroid disease     PAST SURGICAL HISTORY: Past Surgical History:  Procedure Laterality Date  . ANTERIOR CERVICAL DECOMP/DISCECTOMY FUSION  01/2004  . BACK SURGERY    . CARDIAC CATHETERIZATION  06/22/2010   normal coronary arteries, PAF  . CATARACT EXTRACTION, BILATERAL Bilateral   . CHILD BIRTHS     X2  . DILATION AND CURETTAGE OF UTERUS    . INFUSION PUMP IMPLANTATION  ~ 2009   baclofen infusion in lower abd  . PAIN PUMP REVISION N/A 07/29/2014   Procedure: Baclofen pump replacement;  Surgeon: Erline Levine, MD;  Location: [facility];  Service: Neurosurgery;  Laterality: N/A;  Baclofen pump replacement  . TONSILLECTOMY AND ADENOIDECTOMY    . VARICOSE VEIN SURGERY  ~ 1968  . VENOUS ABLATION  12/16/2010   radiofreq ablation -Dr Elisabeth Cara and [facility]  . WISDOM TOOTH EXTRACTION      SOCIAL HISTORY: Social History   Tobacco Use  . Smoking status: Former Smoker    Packs/day: 0.50    Years: 7.00    Pack years: 3.50    Types: Cigarettes    Quit date: 08/21/1976    Years  since quitting: 42.6  . Smokeless tobacco: Never Used  Substance Use Topics  . Alcohol use: Yes     Alcohol/week: 1.0 standard drinks    Types: 1 Glasses of wine per week  . Drug use: No    FAMILY HISTORY: Family History  Problem Relation Age of Onset  . Coronary artery disease Father        at age 68  . Hyperlipidemia Father   . Thyroid disease Father   . Coronary artery disease Maternal Grandmother   . Depression Maternal Grandmother   . Cancer Paternal Grandmother   . Depression Mother   . Sudden death Mother   . Anxiety disorder Mother   . Alcoholism Son     ROS: Review of Systems  Constitutional: Negative for weight loss.  Cardiovascular: Negative for chest pain and claudication.  Musculoskeletal: Negative for myalgias.    PHYSICAL EXAM: Pt in no acute distress  RECENT LABS AND TESTS: BMET    Component Value Date/Time   NA 145 (H) 03/19/2019 1517   K 4.2 03/19/2019 1517   CL 105 03/19/2019 1517   CO2 25 03/19/2019 1517   GLUCOSE 78 03/19/2019 1517   GLUCOSE 92 07/08/2017 1214   BUN 24 03/19/2019 1517   CREATININE 0.64 03/19/2019 1517   CALCIUM 9.0 03/19/2019 1517   GFRNONAA 90 03/19/2019 1517   GFRAA 104 03/19/2019 1517   Lab Results  Component Value Date   HGBA1C 5.0 03/19/2019   HGBA1C 5.3 09/11/2018   HGBA1C 5.3 04/23/2018   HGBA1C 5.2 10/01/2017   HGBA1C 5.4 03/08/2017   Lab Results  Component Value Date   INSULIN 7.1 03/19/2019   INSULIN 13.0 09/11/2018   INSULIN 7.8 04/23/2018   INSULIN 20.4 11/27/2017   INSULIN 20.4 03/08/2017   CBC    Component Value Date/Time   WBC 8.9 07/11/2018   WBC 8.4 07/08/2017 1214   RBC 4.83 07/08/2017 1214   HGB 14.6 07/11/2018   HGB 14.1 03/08/2017 1152   HCT 45 07/11/2018   HCT 43.0 03/08/2017 1152   PLT 185 10/01/2017   PLT 219 12/18/2014 1157   MCV 93.2 07/08/2017 1214   MCV 93 03/08/2017 1152   MCH 31.5 07/08/2017 1214   MCHC 33.8 07/08/2017 1214   RDW 13.3 07/08/2017 1214   RDW 13.3 03/08/2017 1152   LYMPHSABS 1.6 07/08/2017 1214   LYMPHSABS 2.2 03/08/2017 1152   MONOABS 1.3 (H)  07/08/2017 1214   EOSABS 0.2 07/08/2017 1214   EOSABS 0.1 03/08/2017 1152   BASOSABS 0.0 07/08/2017 1214   BASOSABS 0.0 03/08/2017 1152   Iron/TIBC/Ferritin/ %Sat No results found for: IRON, TIBC, FERRITIN, IRONPCTSAT Lipid Panel     Component Value Date/Time   CHOL 111 03/19/2019 1517   TRIG 53 03/19/2019 1517   HDL 57 03/19/2019 1517   CHOLHDL 3.0 04/22/2010 0536   VLDL 29 04/22/2010 0536   LDLCALC 43 03/19/2019 1517   Hepatic Function Panel     Component Value Date/Time   PROT 5.5 (L) 03/19/2019 1517   ALBUMIN 3.7 03/19/2019 1517   AST 12 03/19/2019 1517   ALT 22 03/19/2019 1517   ALKPHOS 48 03/19/2019 1517   BILITOT 0.2 03/19/2019 1517      Component Value Date/Time   TSH 2.060 03/19/2019 1517   TSH 0.651 11/27/2017 1011   TSH 2.410 03/08/2017 1152      I, Trixie Dredge, am acting as Location manager for Dennard Nip, MD I have reviewed the above documentation for accuracy and  completeness, and I agree with the above. -Dennard Nip, MD

## 2019-04-10 ENCOUNTER — Telehealth: Payer: Self-pay | Admitting: Emergency Medicine

## 2019-04-10 DIAGNOSIS — G35 Multiple sclerosis: Secondary | ICD-10-CM | POA: Diagnosis not present

## 2019-04-10 DIAGNOSIS — I4891 Unspecified atrial fibrillation: Secondary | ICD-10-CM | POA: Diagnosis not present

## 2019-04-10 DIAGNOSIS — Z5181 Encounter for therapeutic drug level monitoring: Secondary | ICD-10-CM | POA: Diagnosis not present

## 2019-04-10 DIAGNOSIS — Z7901 Long term (current) use of anticoagulants: Secondary | ICD-10-CM | POA: Diagnosis not present

## 2019-04-10 DIAGNOSIS — I10 Essential (primary) hypertension: Secondary | ICD-10-CM | POA: Diagnosis not present

## 2019-04-10 NOTE — Telephone Encounter (Signed)
Se should definitely continue on Linzess if she feels it is helping until we are able to complete her evaluation. She may have a 3 months supply with 3 refills. Thank you.

## 2019-04-10 NOTE — Telephone Encounter (Signed)
Spoke with patient regarding scheduling a hospital colonoscopy and anorectal manometry. She states she still does not feel comfortable with doing any hospital procedures at this time due to [phone] and her multiple health problems she is high risk.   Patient states she is taking Linzess and it works ok but she doesn't feel completely empty. Would like to know if you would like her to continue this and if so can she have a 90 day script sent to [facility].

## 2019-04-11 MED ORDER — LINACLOTIDE 145 MCG PO CAPS: 145.0000 ug | ORAL_CAPSULE | Freq: Every day | ORAL | 3 refills | Status: DC

## 2019-04-11 NOTE — Telephone Encounter (Signed)
rx sent to [facility] and I left a detailed message that rx was sent and to please call back for any additional questions or concerns.

## 2019-04-14 DIAGNOSIS — Z7901 Long term (current) use of anticoagulants: Secondary | ICD-10-CM | POA: Diagnosis not present

## 2019-04-14 DIAGNOSIS — Z5181 Encounter for therapeutic drug level monitoring: Secondary | ICD-10-CM | POA: Diagnosis not present

## 2019-04-14 DIAGNOSIS — I4891 Unspecified atrial fibrillation: Secondary | ICD-10-CM | POA: Diagnosis not present

## 2019-04-14 DIAGNOSIS — G35 Multiple sclerosis: Secondary | ICD-10-CM | POA: Diagnosis not present

## 2019-04-14 DIAGNOSIS — I10 Essential (primary) hypertension: Secondary | ICD-10-CM | POA: Diagnosis not present

## 2019-04-21 DIAGNOSIS — I10 Essential (primary) hypertension: Secondary | ICD-10-CM | POA: Diagnosis not present

## 2019-04-21 DIAGNOSIS — I4891 Unspecified atrial fibrillation: Secondary | ICD-10-CM | POA: Diagnosis not present

## 2019-04-21 DIAGNOSIS — Z7901 Long term (current) use of anticoagulants: Secondary | ICD-10-CM | POA: Diagnosis not present

## 2019-04-21 DIAGNOSIS — G35 Multiple sclerosis: Secondary | ICD-10-CM | POA: Diagnosis not present

## 2019-04-21 DIAGNOSIS — Z5181 Encounter for therapeutic drug level monitoring: Secondary | ICD-10-CM | POA: Diagnosis not present

## 2019-04-22 NOTE — Telephone Encounter (Signed)
Patient called and reviewed echo results in detail. Questions answered to patient's satisfaction concerning EF, HF, LA size

## 2019-04-23 ENCOUNTER — Telehealth (INDEPENDENT_AMBULATORY_CARE_PROVIDER_SITE_OTHER): Payer: Medicare Other | Admitting: Family Medicine

## 2019-04-23 ENCOUNTER — Encounter (INDEPENDENT_AMBULATORY_CARE_PROVIDER_SITE_OTHER): Payer: Self-pay | Admitting: Family Medicine

## 2019-04-23 ENCOUNTER — Other Ambulatory Visit: Payer: Self-pay

## 2019-04-23 DIAGNOSIS — R7303 Prediabetes: Secondary | ICD-10-CM

## 2019-04-23 DIAGNOSIS — E669 Obesity, unspecified: Secondary | ICD-10-CM

## 2019-04-23 DIAGNOSIS — I10 Essential (primary) hypertension: Secondary | ICD-10-CM

## 2019-04-23 DIAGNOSIS — Z6834 Body mass index (BMI) 34.0-34.9, adult: Secondary | ICD-10-CM

## 2019-04-29 ENCOUNTER — Other Ambulatory Visit: Payer: Self-pay

## 2019-04-29 ENCOUNTER — Ambulatory Visit (INDEPENDENT_AMBULATORY_CARE_PROVIDER_SITE_OTHER): Payer: Medicare Other | Admitting: Pharmacist

## 2019-04-29 DIAGNOSIS — I4821 Permanent atrial fibrillation: Secondary | ICD-10-CM | POA: Diagnosis not present

## 2019-04-29 DIAGNOSIS — Z7901 Long term (current) use of anticoagulants: Secondary | ICD-10-CM

## 2019-04-29 LAB — POCT INR: INR: 2.2 (ref 2.0–3.0)

## 2019-04-29 NOTE — Progress Notes (Signed)
Office: [phone]  /  Fax: [phone] TeleHealth Visit:  Toni [name] has verbally consented to this TeleHealth visit today. The patient is located at home, the provider is located at the [employer] [facility] office. The participants in this visit include the listed provider and patient. The visit was conducted today via face time.  HPI:   Chief Complaint: OBESITY Toni [name] is here to discuss her progress with her obesity treatment plan. She is on the Category 1 plan and is following her eating plan approximately 90 % of the time. She states she is doing physical therapy 5 times per week. Toni [name] continues to do well with her diet prescription and feels she has lost a little more weight. She is unable to weigh at home due to her MS and can not stand on her own.  We were unable to weigh the patient today for this TeleHealth visit. She feels as if she has lost weight since her last visit. She has lost 90 lbs since starting treatment with [name].  Hypertension Toni [name] is a 73 y.o. female with hypertension. Vicki has had fluctuating blood pressures with medication changes, but states her blood pressure was 110/57 and pulse at 69 today. She feels well and denies chest pain, headaches, or dizziness. She is working on weight loss to help control her blood pressure with the goal of decreasing her risk of heart attack and stroke.   Pre-Diabetes Toni [name] has a diagnosis of pre-diabetes based on her elevated Hgb A1c and was informed this puts her at greater risk of developing diabetes. Her last A1c has greatly improved on diet and with weight loss. Her polyphagia is controlled. She continues to work on diet and exercise to decrease risk of diabetes.   ASSESSMENT AND PLAN:  Essential hypertension  Prediabetes  Class 1 obesity with serious comorbidity and body mass index (BMI) of 34.0 to 34.9 in adult, unspecified obesity type  PLAN:  Hypertension We discussed sodium  restriction, working on healthy weight loss, and a regular exercise program as the means to achieve improved blood pressure control. Toni [name] agreed with this plan and agreed to follow up as directed. We will continue to monitor her blood pressure as well as her progress with the above lifestyle modifications. Toni [name] agrees to continue her medications as is for now and make sure to increase her [name] intake. She will watch for signs of hypotension as she continues her lifestyle modifications. Toni [name] agrees to follow up with our clinic in 3 weeks.  Pre-Diabetes Toni [name] will continue to work on diet, exercise, and weight loss, and decreasing simple carbohydrates in her diet to help decrease the risk of diabetes. We dicussed metformin including benefits and risks. She was informed that eating too many simple carbohydrates or too many calories at one sitting increases the likelihood of GI side effects. Toni [name] agrees to follow up with our clinic in 3 weeks as directed to monitor her progress.  I spent > than 50% of the 25 minute visit on counseling as documented in the note.  Obesity Toni [name] is currently in the action stage of change. As such, her goal is to continue with weight loss efforts She has agreed to follow the Category 1 plan Toni [name] has been instructed to work up to a goal of 150 minutes of combined cardio and strengthening exercise per week for weight loss and overall health benefits. We discussed the following Behavioral Modification Strategies today: increase [name] intake, no skipping meals, and better snacking  choices   Toni [name] has agreed to follow up with our clinic in 3 weeks. She was informed of the importance of frequent follow up visits to maximize her success with intensive lifestyle modifications for her multiple health conditions.  ALLERGIES: Allergies  Allergen Reactions  . Hydrocodone Nausea And Vomiting    Hydrocodone causes vomiting  . Oxycodone Nausea And  Vomiting    Oral oxycodone causes vomiting  . Chlorhexidine Gluconate Rash    Sores at site  . Zoloft [Sertraline] Hives, Swelling and Rash    MEDICATIONS: Current Outpatient Medications on File Prior to Visit  Medication Sig Dispense Refill  . acetaminophen (TYLENOL) 500 MG tablet Take 500-1,000 mg by mouth every 6 (six) hours as needed for fever or headache (or pain).    Marland Kitchen ALPRAZolam (XANAX) 0.5 MG tablet Take 1 tablet (0.5 mg total) by mouth at bedtime as needed for anxiety or sleep. 30 tablet 0  . baclofen (LIORESAL) 20 MG tablet Once to two tablets a day for breakthrough spasms 180 tablet 0  . Biotin (PA BIOTIN) 1000 MCG tablet Take 1,000 mcg by mouth daily.    Marland Kitchen buPROPion (WELLBUTRIN XL) 300 MG 24 hr tablet Take 1 tablet (300 mg total) by mouth daily. 30 tablet 0  . CALCIUM CITRATE PO Take 2 tablets by mouth 2 (two) times daily.    . cetirizine (ZYRTEC) 10 MG tablet Take 10 mg by mouth daily.    . Cholecalciferol (VITAMIN D) 2000 units tablet Take 1 tablet (2,000 Units total) by mouth 2 (two) times daily.  0  . co-enzyme Q-10 30 MG capsule Take 30 mg by mouth daily.    . corticotropin (ACTHAR) 80 UNIT/ML injectable gel INJECT 80 UNITS (1ML)  SUBCUTANEOUSLY EVERY OTHER  DAY FOR A TOTAL OF 10 DAYS. REPEAT EVERY 30 DAYS.  (DISCARD 28 DAYS AFTER 1ST  USE) 5 mL 5  . Cranberry 1000 MG CAPS Take by mouth.    . diltiazem (CARDIZEM CD) 180 MG 24 hr capsule TAKE 1 CAPSULE BY MOUTH  DAILY 90 capsule 3  . docusate sodium (COLACE) 100 MG capsule Take 1 capsule (100 mg total) by mouth daily. 10 capsule 0  . famotidine (PEPCID) 20 MG tablet TK 1 T PO D  6  . fluticasone (FLONASE) 50 MCG/ACT nasal spray Place 2 sprays into both nostrils daily as needed for allergies.     . furosemide (LASIX) 40 MG tablet Take 80 mg by mouth daily.     Marland Kitchen gabapentin (NEURONTIN) 300 MG capsule TAKE 1 CAPSULE BY MOUTH UP  TO 4 TIMES DAILY 360 capsule 3  . GENERLAC 10 GM/15ML SOLN TAKE 15 ML BY MOUTH TWICE DAILY AS  NEEDED 946 mL 0  . levothyroxine (SYNTHROID, LEVOTHROID) 75 MCG tablet Take 75 mcg by mouth daily.    Marland Kitchen linaclotide (LINZESS) 145 MCG CAPS capsule Take 1 capsule (145 mcg total) by mouth daily before breakfast. 90 capsule 3  . Multiple Vitamins-Minerals (CENTRUM SILVER PO) Take 1 tablet by mouth daily.    . nebivolol (BYSTOLIC) 10 MG tablet Take 10 mg by mouth daily. Take 10mg  daily unless bp systolic is below [id number] then take 5mg     . Omega-3 Fatty Acids (FISH OIL) 1000 MG CAPS Take 1,000 mg by mouth daily.    Marland Kitchen omeprazole (PRILOSEC) 20 MG capsule Take 20 mg by mouth daily.     . psyllium (METAMUCIL SMOOTH TEXTURE) 28 % packet Take 1 packet by mouth every other day.    Marland Kitchen  simvastatin (ZOCOR) 10 MG tablet     . SYRINGE/NEEDLE, DISP, 1 ML (BD LUER-LOK SYRINGE) 25G X 5/8" 1 ML MISC Use as directed to inject  Acthar 10 each prn  . Tamsulosin HCl (FLOMAX) 0.4 MG CAPS Take 0.4 mg by mouth daily.    Marland Kitchen UNABLE TO FIND Baclofen pump: Continuous    . warfarin (COUMADIN) 5 MG tablet TAKE 1 TABLET BY MOUTH  DAILY AS DIRECTED BY  COUMADIN CLINIC 90 tablet 1   Current Facility-Administered Medications on File Prior to Visit  Medication Dose Route Frequency Provider Last Rate Last Dose  . baclofen (LIORESAL) intrathecal injection 40 mg/34mL  40 mg Intrathecal Once Kathrynn Ducking, MD        PAST MEDICAL HISTORY: Past Medical History:  Diagnosis Date  . Abnormality of gait 03/01/2015  . Anxiety   . Arthritis    "knees" (01/19/2016)  . Atrial fibrillation with RVR ([place]) 01/19/2016  . Back pain   . Chest pain   . Childhood asthma   . Chronic pain    "nerve pain from the MS"  . Constipation   . Depression   . DVT (deep venous thrombosis) ([place]) 1983   "RLE; may have just been phlebitis; it was before the age of dopplers"  . 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 GERD (gastroesophageal reflux disease)   . History of stress test 06/21/10   limited  exam with some degree of breast attenuatin of the apex however a component of apical ischemia is not excluded, EF 62%; cardiac cath   . HLD (hyperlipidemia)   . Hx of echocardiogram 04/22/10   EF 55-60%, no valve issues  . Hypertension   . Hypothyroidism   . Joint pain   . Migraine    "none since I stopped beta blocker in 1990s" (01/19/2016)  . Multiple sclerosis ([place])    has had this may 1989-[facility] reg doc  . OSA on CPAP   . Osteoarthritis   . PAF (paroxysmal atrial fibrillation) ([place])    on coumadin; documented on monitor 06/2010  . Pneumonia 11/2011  . Shortness of breath   . Thyroid disease     PAST SURGICAL HISTORY: Past Surgical History:  Procedure Laterality Date  . ANTERIOR CERVICAL DECOMP/DISCECTOMY FUSION  01/2004  . BACK SURGERY    . CARDIAC CATHETERIZATION  06/22/2010   normal coronary arteries, PAF  . CATARACT EXTRACTION, BILATERAL Bilateral   . CHILD BIRTHS     X2  . DILATION AND CURETTAGE OF UTERUS    . INFUSION PUMP IMPLANTATION  ~ 2009   baclofen infusion in lower abd  . PAIN PUMP REVISION N/A 07/29/2014   Procedure: Baclofen pump replacement;  Surgeon: Erline Levine, MD;  Location: [facility];  Service: Neurosurgery;  Laterality: N/A;  Baclofen pump replacement  . TONSILLECTOMY AND ADENOIDECTOMY    . VARICOSE VEIN SURGERY  ~ 1968  . VENOUS ABLATION  12/16/2010   radiofreq ablation -Dr Elisabeth Cara and [facility]  . WISDOM TOOTH EXTRACTION      SOCIAL HISTORY: Social History   Tobacco Use  . Smoking status: Former Smoker    Packs/day: 0.50    Years: 7.00    Pack years: 3.50    Types: Cigarettes    Quit date: 08/21/1976    Years since quitting: 42.7  . Smokeless tobacco: Never Used  Substance Use Topics  . Alcohol use: Yes  Alcohol/week: 1.0 standard drinks    Types: 1 Glasses of wine per week  . Drug use: No    FAMILY HISTORY: Family History  Problem Relation Age of Onset  . Coronary artery disease Father        at age 5  . Hyperlipidemia Father    . Thyroid disease Father   . Coronary artery disease Maternal Grandmother   . Depression Maternal Grandmother   . Cancer Paternal Grandmother   . Depression Mother   . Sudden death Mother   . Anxiety disorder Mother   . Alcoholism Son     ROS: Review of Systems  Constitutional: Positive for weight loss.  Cardiovascular: Negative for chest pain.  Neurological: Negative for dizziness and headaches.  Endo/Heme/Allergies:       Negative polyphagia    PHYSICAL EXAM: Pt in no acute distress  RECENT LABS AND TESTS: BMET    Component Value Date/Time   NA 145 (H) 03/19/2019 1517   K 4.2 03/19/2019 1517   CL 105 03/19/2019 1517   CO2 25 03/19/2019 1517   GLUCOSE 78 03/19/2019 1517   GLUCOSE 92 07/08/2017 1214   BUN 24 03/19/2019 1517   CREATININE 0.64 03/19/2019 1517   CALCIUM 9.0 03/19/2019 1517   GFRNONAA 90 03/19/2019 1517   GFRAA 104 03/19/2019 1517   Lab Results  Component Value Date   HGBA1C 5.0 03/19/2019   HGBA1C 5.3 09/11/2018   HGBA1C 5.3 04/23/2018   HGBA1C 5.2 10/01/2017   HGBA1C 5.4 03/08/2017   Lab Results  Component Value Date   INSULIN 7.1 03/19/2019   INSULIN 13.0 09/11/2018   INSULIN 7.8 04/23/2018   INSULIN 20.4 11/27/2017   INSULIN 20.4 03/08/2017   CBC    Component Value Date/Time   WBC 8.9 07/11/2018   WBC 8.4 07/08/2017 1214   RBC 4.83 07/08/2017 1214   HGB 14.6 07/11/2018   HGB 14.1 03/08/2017 1152   HCT 45 07/11/2018   HCT 43.0 03/08/2017 1152   PLT 185 10/01/2017   PLT 219 12/18/2014 1157   MCV 93.2 07/08/2017 1214   MCV 93 03/08/2017 1152   MCH 31.5 07/08/2017 1214   MCHC 33.8 07/08/2017 1214   RDW 13.3 07/08/2017 1214   RDW 13.3 03/08/2017 1152   LYMPHSABS 1.6 07/08/2017 1214   LYMPHSABS 2.2 03/08/2017 1152   MONOABS 1.3 (H) 07/08/2017 1214   EOSABS 0.2 07/08/2017 1214   EOSABS 0.1 03/08/2017 1152   BASOSABS 0.0 07/08/2017 1214   BASOSABS 0.0 03/08/2017 1152   Iron/TIBC/Ferritin/ %Sat No results found for: IRON, TIBC,  FERRITIN, IRONPCTSAT Lipid Panel     Component Value Date/Time   CHOL 111 03/19/2019 1517   TRIG 53 03/19/2019 1517   HDL 57 03/19/2019 1517   CHOLHDL 3.0 04/22/2010 0536   VLDL 29 04/22/2010 0536   LDLCALC 43 03/19/2019 1517   Hepatic Function Panel     Component Value Date/Time   PROT 5.5 (L) 03/19/2019 1517   ALBUMIN 3.7 03/19/2019 1517   AST 12 03/19/2019 1517   ALT 22 03/19/2019 1517   ALKPHOS 48 03/19/2019 1517   BILITOT 0.2 03/19/2019 1517      Component Value Date/Time   TSH 2.060 03/19/2019 1517   TSH 0.651 11/27/2017 1011   TSH 2.410 03/08/2017 1152      I, Trixie Dredge, am acting as Location manager for Dennard Nip, MD I have reviewed the above documentation for accuracy and completeness, and I agree with the above. -Dennard Nip, MD

## 2019-05-02 ENCOUNTER — Other Ambulatory Visit: Payer: Self-pay | Admitting: Adult Health

## 2019-05-03 DIAGNOSIS — E039 Hypothyroidism, unspecified: Secondary | ICD-10-CM | POA: Diagnosis not present

## 2019-05-03 DIAGNOSIS — I872 Venous insufficiency (chronic) (peripheral): Secondary | ICD-10-CM | POA: Diagnosis not present

## 2019-05-03 DIAGNOSIS — Z79899 Other long term (current) drug therapy: Secondary | ICD-10-CM | POA: Diagnosis not present

## 2019-05-03 DIAGNOSIS — K219 Gastro-esophageal reflux disease without esophagitis: Secondary | ICD-10-CM | POA: Diagnosis not present

## 2019-05-03 DIAGNOSIS — C642 Malignant neoplasm of left kidney, except renal pelvis: Secondary | ICD-10-CM | POA: Diagnosis not present

## 2019-05-03 DIAGNOSIS — E669 Obesity, unspecified: Secondary | ICD-10-CM | POA: Diagnosis not present

## 2019-05-03 DIAGNOSIS — I4891 Unspecified atrial fibrillation: Secondary | ICD-10-CM | POA: Diagnosis not present

## 2019-05-03 DIAGNOSIS — Z7901 Long term (current) use of anticoagulants: Secondary | ICD-10-CM | POA: Diagnosis not present

## 2019-05-03 DIAGNOSIS — I10 Essential (primary) hypertension: Secondary | ICD-10-CM | POA: Diagnosis not present

## 2019-05-03 DIAGNOSIS — N3289 Other specified disorders of bladder: Secondary | ICD-10-CM | POA: Diagnosis not present

## 2019-05-03 DIAGNOSIS — G35 Multiple sclerosis: Secondary | ICD-10-CM | POA: Diagnosis not present

## 2019-05-03 DIAGNOSIS — R300 Dysuria: Secondary | ICD-10-CM | POA: Diagnosis not present

## 2019-05-03 DIAGNOSIS — F418 Other specified anxiety disorders: Secondary | ICD-10-CM | POA: Diagnosis not present

## 2019-05-03 DIAGNOSIS — J309 Allergic rhinitis, unspecified: Secondary | ICD-10-CM | POA: Diagnosis not present

## 2019-05-03 DIAGNOSIS — M8580 Other specified disorders of bone density and structure, unspecified site: Secondary | ICD-10-CM | POA: Diagnosis not present

## 2019-05-03 DIAGNOSIS — E785 Hyperlipidemia, unspecified: Secondary | ICD-10-CM | POA: Diagnosis not present

## 2019-05-03 DIAGNOSIS — Z9181 History of falling: Secondary | ICD-10-CM | POA: Diagnosis not present

## 2019-05-03 DIAGNOSIS — Z6832 Body mass index (BMI) 32.0-32.9, adult: Secondary | ICD-10-CM | POA: Diagnosis not present

## 2019-05-03 DIAGNOSIS — K5900 Constipation, unspecified: Secondary | ICD-10-CM | POA: Diagnosis not present

## 2019-05-05 NOTE — Telephone Encounter (Signed)
[place] Database Verified LR: 02-10-2019 Qty: 30 Pending appointment: 07-02-2019 ( Dr. [name])

## 2019-05-09 DIAGNOSIS — I4891 Unspecified atrial fibrillation: Secondary | ICD-10-CM | POA: Diagnosis not present

## 2019-05-09 DIAGNOSIS — I872 Venous insufficiency (chronic) (peripheral): Secondary | ICD-10-CM | POA: Diagnosis not present

## 2019-05-09 DIAGNOSIS — I10 Essential (primary) hypertension: Secondary | ICD-10-CM | POA: Diagnosis not present

## 2019-05-09 DIAGNOSIS — G35 Multiple sclerosis: Secondary | ICD-10-CM | POA: Diagnosis not present

## 2019-05-09 DIAGNOSIS — E669 Obesity, unspecified: Secondary | ICD-10-CM | POA: Diagnosis not present

## 2019-05-09 DIAGNOSIS — E785 Hyperlipidemia, unspecified: Secondary | ICD-10-CM | POA: Diagnosis not present

## 2019-05-12 DIAGNOSIS — G35 Multiple sclerosis: Secondary | ICD-10-CM | POA: Diagnosis not present

## 2019-05-12 DIAGNOSIS — I4891 Unspecified atrial fibrillation: Secondary | ICD-10-CM | POA: Diagnosis not present

## 2019-05-12 DIAGNOSIS — E785 Hyperlipidemia, unspecified: Secondary | ICD-10-CM | POA: Diagnosis not present

## 2019-05-12 DIAGNOSIS — I10 Essential (primary) hypertension: Secondary | ICD-10-CM | POA: Diagnosis not present

## 2019-05-12 DIAGNOSIS — E669 Obesity, unspecified: Secondary | ICD-10-CM | POA: Diagnosis not present

## 2019-05-12 DIAGNOSIS — I872 Venous insufficiency (chronic) (peripheral): Secondary | ICD-10-CM | POA: Diagnosis not present

## 2019-05-14 ENCOUNTER — Telehealth (INDEPENDENT_AMBULATORY_CARE_PROVIDER_SITE_OTHER): Payer: Medicare Other | Admitting: Family Medicine

## 2019-05-14 ENCOUNTER — Encounter (INDEPENDENT_AMBULATORY_CARE_PROVIDER_SITE_OTHER): Payer: Self-pay | Admitting: Family Medicine

## 2019-05-14 ENCOUNTER — Other Ambulatory Visit: Payer: Self-pay | Admitting: Neurology

## 2019-05-14 ENCOUNTER — Other Ambulatory Visit: Payer: Self-pay

## 2019-05-14 DIAGNOSIS — F418 Other specified anxiety disorders: Secondary | ICD-10-CM

## 2019-05-14 DIAGNOSIS — Z6834 Body mass index (BMI) 34.0-34.9, adult: Secondary | ICD-10-CM | POA: Diagnosis not present

## 2019-05-14 DIAGNOSIS — E669 Obesity, unspecified: Secondary | ICD-10-CM

## 2019-05-15 ENCOUNTER — Ambulatory Visit (INDEPENDENT_AMBULATORY_CARE_PROVIDER_SITE_OTHER): Payer: Medicare Other | Admitting: Family Medicine

## 2019-05-15 DIAGNOSIS — I872 Venous insufficiency (chronic) (peripheral): Secondary | ICD-10-CM | POA: Diagnosis not present

## 2019-05-15 DIAGNOSIS — E669 Obesity, unspecified: Secondary | ICD-10-CM | POA: Diagnosis not present

## 2019-05-15 DIAGNOSIS — I4891 Unspecified atrial fibrillation: Secondary | ICD-10-CM | POA: Diagnosis not present

## 2019-05-15 DIAGNOSIS — I10 Essential (primary) hypertension: Secondary | ICD-10-CM | POA: Diagnosis not present

## 2019-05-15 DIAGNOSIS — E785 Hyperlipidemia, unspecified: Secondary | ICD-10-CM | POA: Diagnosis not present

## 2019-05-15 DIAGNOSIS — G35 Multiple sclerosis: Secondary | ICD-10-CM | POA: Diagnosis not present

## 2019-05-18 NOTE — Progress Notes (Signed)
Office: [phone]  /  Fax: [phone] TeleHealth Visit:  Toni [name] has verbally consented to this TeleHealth visit today. The patient is located at home, the provider is located at the [employer] [facility] office. The participants in this visit include the listed provider and patient. The visit was conducted today via face time.  HPI:   Chief Complaint: OBESITY Toni [name] is here to discuss her progress with her obesity treatment plan. She is on the Category 1 plan and is following her eating plan approximately 80 % of the time. She states she is doing physical therapy exercises 1 time per week. Toni [name] has done well maintaining her weight. She is frustrated that she hasn't lost more weight, but has done very well overall. She is following her meal plan but sometimes her portions are larger than recommended. She states her blood pressure was 125/64 and pulse at 72. We were unable to weigh the patient today for this TeleHealth visit. She feels as if she has maintained her weight since her last visit. She has lost 90 lbs since starting treatment with [name].  Depression with Anxiety Toni [name] finds herself very stressed about multiple events in the country, and feels very distressed about this. She is taking her medications as prescribed. She shows no sign of suicidal or homicidal ideations.  ASSESSMENT AND PLAN:  Depression with anxiety  Class 1 obesity with serious comorbidity and body mass index (BMI) of 34.0 to 34.9 in adult, unspecified obesity type  PLAN:  Depression with Anxiety We discussed behavior modification techniques today to help Toni [name] deal with her depression and anxiety. We discussed decreasing the amount of news she watches and reads. She was offered a referral for counseling and she will consider this.  Obesity Toni [name] is currently in the action stage of change. As such, her goal is to continue with weight loss efforts She has agreed to follow the  Category 1 plan Toni [name] has been instructed to work up to a goal of 150 minutes of combined cardio and strengthening exercise per week for weight loss and overall health benefits. We discussed the following Behavioral Modification Strategies today: increasing lean protein intake and increasing vegetables   Toni [name] has agreed to follow up with our clinic in 2 weeks. She was informed of the importance of frequent follow up visits to maximize her success with intensive lifestyle modifications for her multiple health conditions.  ALLERGIES: Allergies  Allergen Reactions  . Hydrocodone Nausea And Vomiting    Hydrocodone causes vomiting  . Oxycodone Nausea And Vomiting    Oral oxycodone causes vomiting  . Chlorhexidine Gluconate Rash    Sores at site  . Zoloft [Sertraline] Hives, Swelling and Rash    MEDICATIONS: Current Outpatient Medications on File Prior to Visit  Medication Sig Dispense Refill  . acetaminophen (TYLENOL) 500 MG tablet Take 500-1,000 mg by mouth every 6 (six) hours as needed for fever or headache (or pain).    Marland Kitchen ALPRAZolam (XANAX) 0.5 MG tablet TAKE 1 TABLET BY MOUTH AT  BEDTIME AS NEEDED FOR  ANXIETY OR SLEEP 30 tablet 0  . baclofen (LIORESAL) 20 MG tablet Once to two tablets a day for breakthrough spasms 180 tablet 0  . Biotin (PA BIOTIN) 1000 MCG tablet Take 1,000 mcg by mouth daily.    Marland Kitchen buPROPion (WELLBUTRIN XL) 300 MG 24 hr tablet Take 1 tablet (300 mg total) by mouth daily. 30 tablet 0  . CALCIUM CITRATE PO Take 2 tablets by mouth 2 (two)  times daily.    . cetirizine (ZYRTEC) 10 MG tablet Take 10 mg by mouth daily.    . Cholecalciferol (VITAMIN D) 2000 units tablet Take 1 tablet (2,000 Units total) by mouth 2 (two) times daily.  0  . co-enzyme Q-10 30 MG capsule Take 30 mg by mouth daily.    . corticotropin (ACTHAR) 80 UNIT/ML injectable gel INJECT 80 UNITS (1ML)  SUBCUTANEOUSLY EVERY OTHER  DAY FOR A TOTAL OF 10 DAYS. REPEAT EVERY 30 DAYS.  (DISCARD 28 DAYS  AFTER 1ST  USE) 5 mL 5  . Cranberry 1000 MG CAPS Take by mouth.    . diltiazem (CARDIZEM CD) 180 MG 24 hr capsule TAKE 1 CAPSULE BY MOUTH  DAILY 90 capsule 3  . docusate sodium (COLACE) 100 MG capsule Take 1 capsule (100 mg total) by mouth daily. 10 capsule 0  . famotidine (PEPCID) 20 MG tablet TK 1 T PO D  6  . fluticasone (FLONASE) 50 MCG/ACT nasal spray Place 2 sprays into both nostrils daily as needed for allergies.     . furosemide (LASIX) 40 MG tablet Take 80 mg by mouth daily.     Marland Kitchen gabapentin (NEURONTIN) 300 MG capsule TAKE 1 CAPSULE BY MOUTH UP  TO 4 TIMES DAILY 360 capsule 3  . levothyroxine (SYNTHROID, LEVOTHROID) 75 MCG tablet Take 75 mcg by mouth daily.    Marland Kitchen linaclotide (LINZESS) 145 MCG CAPS capsule Take 1 capsule (145 mcg total) by mouth daily before breakfast. 90 capsule 3  . Multiple Vitamins-Minerals (CENTRUM SILVER PO) Take 1 tablet by mouth daily.    . nebivolol (BYSTOLIC) 10 MG tablet Take 10 mg by mouth daily. Take 10mg  daily unless bp systolic is below [id number] then take 5mg     . Omega-3 Fatty Acids (FISH OIL) 1000 MG CAPS Take 1,000 mg by mouth daily.    Marland Kitchen omeprazole (PRILOSEC) 20 MG capsule Take 20 mg by mouth daily.     . psyllium (METAMUCIL SMOOTH TEXTURE) 28 % packet Take 1 packet by mouth every other day.    . simvastatin (ZOCOR) 10 MG tablet     . SYRINGE/NEEDLE, DISP, 1 ML (BD LUER-LOK SYRINGE) 25G X 5/8" 1 ML MISC Use as directed to inject  Acthar 10 each prn  . Tamsulosin HCl (FLOMAX) 0.4 MG CAPS Take 0.4 mg by mouth daily.    Marland Kitchen UNABLE TO FIND Baclofen pump: Continuous    . warfarin (COUMADIN) 5 MG tablet TAKE 1 TABLET BY MOUTH  DAILY AS DIRECTED BY  COUMADIN CLINIC 90 tablet 1   Current Facility-Administered Medications on File Prior to Visit  Medication Dose Route Frequency Provider Last Rate Last Dose  . baclofen (LIORESAL) intrathecal injection 40 mg/75mL  40 mg Intrathecal Once Kathrynn Ducking, MD        PAST MEDICAL HISTORY: Past Medical History:   Diagnosis Date  . Abnormality of gait 03/01/2015  . Anxiety   . Arthritis    "knees" (01/19/2016)  . Atrial fibrillation with RVR ([place]) 01/19/2016  . Back pain   . Chest pain   . Childhood asthma   . Chronic pain    "nerve pain from the MS"  . Constipation   . Depression   . DVT (deep venous thrombosis) ([place]) 1983   "RLE; may have just been phlebitis; it was before the age of dopplers"  . 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 GERD (  gastroesophageal reflux disease)   . History of stress test 06/21/10   limited exam with some degree of breast attenuatin of the apex however a component of apical ischemia is not excluded, EF 62%; cardiac cath   . HLD (hyperlipidemia)   . Hx of echocardiogram 04/22/10   EF 55-60%, no valve issues  . Hypertension   . Hypothyroidism   . Joint pain   . Migraine    "none since I stopped beta blocker in 1990s" (01/19/2016)  . Multiple sclerosis ([place])    has had this may 1989-[facility] reg doc  . OSA on CPAP   . Osteoarthritis   . PAF (paroxysmal atrial fibrillation) ([place])    on coumadin; documented on monitor 06/2010  . Pneumonia 11/2011  . Shortness of breath   . Thyroid disease     PAST SURGICAL HISTORY: Past Surgical History:  Procedure Laterality Date  . ANTERIOR CERVICAL DECOMP/DISCECTOMY FUSION  01/2004  . BACK SURGERY    . CARDIAC CATHETERIZATION  06/22/2010   normal coronary arteries, PAF  . CATARACT EXTRACTION, BILATERAL Bilateral   . CHILD BIRTHS     X2  . DILATION AND CURETTAGE OF UTERUS    . INFUSION PUMP IMPLANTATION  ~ 2009   baclofen infusion in lower abd  . PAIN PUMP REVISION N/A 07/29/2014   Procedure: Baclofen pump replacement;  Surgeon: Erline Levine, MD;  Location: [name];  Service: Neurosurgery;  Laterality: N/A;  Baclofen pump replacement  . TONSILLECTOMY AND ADENOIDECTOMY    . VARICOSE VEIN SURGERY  ~ 1968  . VENOUS ABLATION  12/16/2010   radiofreq ablation -Dr  Elisabeth Cara and [facility]  . WISDOM TOOTH EXTRACTION      SOCIAL HISTORY: Social History   Tobacco Use  . Smoking status: Former Smoker    Packs/day: 0.50    Years: 7.00    Pack years: 3.50    Types: Cigarettes    Quit date: 08/21/1976    Years since quitting: 42.7  . Smokeless tobacco: Never Used  Substance Use Topics  . Alcohol use: Yes    Alcohol/week: 1.0 standard drinks    Types: 1 Glasses of wine per week  . Drug use: No    FAMILY HISTORY: Family History  Problem Relation Age of Onset  . Coronary artery disease Father        at age 60  . Hyperlipidemia Father   . Thyroid disease Father   . Coronary artery disease Maternal Grandmother   . Depression Maternal Grandmother   . Cancer Paternal Grandmother   . Depression Mother   . Sudden death Mother   . Anxiety disorder Mother   . Alcoholism Son     ROS: Review of Systems  Constitutional: Negative for weight loss.  Psychiatric/Behavioral: Positive for depression. Negative for suicidal ideas.       + Anxiety    PHYSICAL EXAM: Pt in no acute distress  RECENT LABS AND TESTS: BMET    Component Value Date/Time   NA 145 (H) 03/19/2019 1517   K 4.2 03/19/2019 1517   CL 105 03/19/2019 1517   CO2 25 03/19/2019 1517   GLUCOSE 78 03/19/2019 1517   GLUCOSE 92 07/08/2017 1214   BUN 24 03/19/2019 1517   CREATININE 0.64 03/19/2019 1517   CALCIUM 9.0 03/19/2019 1517   GFRNONAA 90 03/19/2019 1517   GFRAA 104 03/19/2019 1517   Lab Results  Component Value Date   HGBA1C 5.0 03/19/2019   HGBA1C 5.3 09/11/2018   HGBA1C 5.3  04/23/2018   HGBA1C 5.2 10/01/2017   HGBA1C 5.4 03/08/2017   Lab Results  Component Value Date   INSULIN 7.1 03/19/2019   INSULIN 13.0 09/11/2018   INSULIN 7.8 04/23/2018   INSULIN 20.4 11/27/2017   INSULIN 20.4 03/08/2017   CBC    Component Value Date/Time   WBC 8.9 07/11/2018   WBC 8.4 07/08/2017 1214   RBC 4.83 07/08/2017 1214   HGB 14.6 07/11/2018   HGB 14.1 03/08/2017 1152   HCT 45  07/11/2018   HCT 43.0 03/08/2017 1152   PLT 185 10/01/2017   PLT 219 12/18/2014 1157   MCV 93.2 07/08/2017 1214   MCV 93 03/08/2017 1152   MCH 31.5 07/08/2017 1214   MCHC 33.8 07/08/2017 1214   RDW 13.3 07/08/2017 1214   RDW 13.3 03/08/2017 1152   LYMPHSABS 1.6 07/08/2017 1214   LYMPHSABS 2.2 03/08/2017 1152   MONOABS 1.3 (H) 07/08/2017 1214   EOSABS 0.2 07/08/2017 1214   EOSABS 0.1 03/08/2017 1152   BASOSABS 0.0 07/08/2017 1214   BASOSABS 0.0 03/08/2017 1152   Iron/TIBC/Ferritin/ %Sat No results found for: IRON, TIBC, FERRITIN, IRONPCTSAT Lipid Panel     Component Value Date/Time   CHOL 111 03/19/2019 1517   TRIG 53 03/19/2019 1517   HDL 57 03/19/2019 1517   CHOLHDL 3.0 04/22/2010 0536   VLDL 29 04/22/2010 0536   LDLCALC 43 03/19/2019 1517   Hepatic Function Panel     Component Value Date/Time   PROT 5.5 (L) 03/19/2019 1517   ALBUMIN 3.7 03/19/2019 1517   AST 12 03/19/2019 1517   ALT 22 03/19/2019 1517   ALKPHOS 48 03/19/2019 1517   BILITOT 0.2 03/19/2019 1517      Component Value Date/Time   TSH 2.060 03/19/2019 1517   TSH 0.651 11/27/2017 1011   TSH 2.410 03/08/2017 1152      I, Trixie Dredge, am acting as Location manager for Dennard Nip, MD I have reviewed the above documentation for accuracy and completeness, and I agree with the above. -Dennard Nip, MD

## 2019-05-19 ENCOUNTER — Telehealth: Payer: Self-pay | Admitting: Neurology

## 2019-05-19 ENCOUNTER — Other Ambulatory Visit: Payer: Self-pay | Admitting: Vascular Surgery

## 2019-05-19 NOTE — Telephone Encounter (Signed)
Pt has called asking if she can come in on tomorrow to have her baclofen pump reset/adjust the amount of medication that comes in.  Pt is asking if she can come in late tomorrow afternoon, please call.

## 2019-05-20 ENCOUNTER — Telehealth: Payer: Self-pay | Admitting: Neurology

## 2019-05-20 ENCOUNTER — Telehealth: Payer: Self-pay | Admitting: Vascular Surgery

## 2019-05-20 DIAGNOSIS — I872 Venous insufficiency (chronic) (peripheral): Secondary | ICD-10-CM | POA: Diagnosis not present

## 2019-05-20 DIAGNOSIS — E669 Obesity, unspecified: Secondary | ICD-10-CM | POA: Diagnosis not present

## 2019-05-20 DIAGNOSIS — G35 Multiple sclerosis: Secondary | ICD-10-CM | POA: Diagnosis not present

## 2019-05-20 DIAGNOSIS — I4891 Unspecified atrial fibrillation: Secondary | ICD-10-CM | POA: Diagnosis not present

## 2019-05-20 DIAGNOSIS — I10 Essential (primary) hypertension: Secondary | ICD-10-CM | POA: Diagnosis not present

## 2019-05-20 DIAGNOSIS — E785 Hyperlipidemia, unspecified: Secondary | ICD-10-CM | POA: Diagnosis not present

## 2019-05-20 NOTE — Telephone Encounter (Signed)
Pt called stating that she thinks she just had an exacerbation cause of her MS She would like to speak to the RN when available. Please advise.

## 2019-05-20 NOTE — Telephone Encounter (Signed)
Called the patient back to discuss further. There was no answer. LVM for the patient to call back or shoot me a mychart message letting me know what is going on that way I can run her concerns by Dr Brett Fairy and get back with her on what she recommends. If I am unable to take the call when she calls back please ask the patient to give details to document what is going on so that I may run her concerns and issues by the MD

## 2019-05-20 NOTE — Telephone Encounter (Signed)
Pt called and stated she did not need the pump eval today.

## 2019-05-20 NOTE — Telephone Encounter (Signed)
Pt returned call. Please call back when available. 

## 2019-05-20 NOTE — Telephone Encounter (Signed)
Patient called wanting to know if she should come in for a yearly follow up and also to ask about strength of compression stockings.  Per Delmer Islam., if patient is having symptoms, she should get an appt with Dr. Scot Dock and have a Venous Reflux study prior.  Pt advised we will call her back to schedule.  Pt was informed to use compression that is 20-35mmhg.  She verbalized understanding and will order from [facility] in [place].    Pt says she has lost about 60 lbs since her last office visit but will measure her own thighs, calves, and ankles for correct hose size.  Thurston Hole., LPN

## 2019-05-20 NOTE — Telephone Encounter (Signed)
Okay to come in for baclofen pump readjustment.

## 2019-05-20 NOTE — Telephone Encounter (Signed)
Lvm asking pt to call back. Once pt calls back please advise we could see her today around 415.

## 2019-05-21 ENCOUNTER — Other Ambulatory Visit: Payer: Self-pay | Admitting: Neurology

## 2019-05-21 MED ORDER — ACTHAR 80 UNIT/ML IJ GEL: INTRAMUSCULAR | 0 refills | Status: DC

## 2019-05-21 NOTE — Telephone Encounter (Signed)
I called the pt back and she states that she noticed a day or so ago that she developed what she calls her MS headache. She states she typically notices this when she is getting ready to have a MS exacerbation. She states that next day all of a sudden her arms weren't as strong and she was strong enough to even push her Wheelchair. During the night she had difficulty with transferring bed to [place] and back to [facility] and bed. She feels wiped out and fatigue and states that her PT came out and worked with her today and definitely noticed she is weaker then last week. Advised I would discuss what Dr Brett Fairy would like to do for her to get her over this exacerbation. I will call her back.

## 2019-05-21 NOTE — Telephone Encounter (Signed)
I spoke with Dr Brett Fairy in regards to the pt having a exacerbation and Dr Brett Fairy recommends pt taking her acthar injections daily for 10 injections and then resume her normal routine. I will call the patient and make her aware of this and send a script in for her because the patient only has 5 injections.  Called the patient and made her aware I have sent a RX to cover her for this amount to the pharmacy.

## 2019-05-23 DIAGNOSIS — E785 Hyperlipidemia, unspecified: Secondary | ICD-10-CM | POA: Diagnosis not present

## 2019-05-23 DIAGNOSIS — I4891 Unspecified atrial fibrillation: Secondary | ICD-10-CM | POA: Diagnosis not present

## 2019-05-23 DIAGNOSIS — G35 Multiple sclerosis: Secondary | ICD-10-CM | POA: Diagnosis not present

## 2019-05-23 DIAGNOSIS — I10 Essential (primary) hypertension: Secondary | ICD-10-CM | POA: Diagnosis not present

## 2019-05-23 DIAGNOSIS — E669 Obesity, unspecified: Secondary | ICD-10-CM | POA: Diagnosis not present

## 2019-05-23 DIAGNOSIS — I872 Venous insufficiency (chronic) (peripheral): Secondary | ICD-10-CM | POA: Diagnosis not present

## 2019-05-26 ENCOUNTER — Telehealth: Payer: Self-pay | Admitting: Pharmacist

## 2019-05-26 NOTE — Telephone Encounter (Signed)
Patient having MS flared up

## 2019-05-26 NOTE — Telephone Encounter (Signed)
Patient LMOM; Please call cabl and change appt form today 10-5 to Thursday or Friday

## 2019-05-26 NOTE — Telephone Encounter (Signed)
lmom to move appt 

## 2019-05-27 DIAGNOSIS — E785 Hyperlipidemia, unspecified: Secondary | ICD-10-CM | POA: Diagnosis not present

## 2019-05-27 DIAGNOSIS — I10 Essential (primary) hypertension: Secondary | ICD-10-CM | POA: Diagnosis not present

## 2019-05-27 DIAGNOSIS — I4891 Unspecified atrial fibrillation: Secondary | ICD-10-CM | POA: Diagnosis not present

## 2019-05-27 DIAGNOSIS — E669 Obesity, unspecified: Secondary | ICD-10-CM | POA: Diagnosis not present

## 2019-05-27 DIAGNOSIS — G35 Multiple sclerosis: Secondary | ICD-10-CM | POA: Diagnosis not present

## 2019-05-27 DIAGNOSIS — I872 Venous insufficiency (chronic) (peripheral): Secondary | ICD-10-CM | POA: Diagnosis not present

## 2019-05-29 ENCOUNTER — Telehealth: Payer: Self-pay

## 2019-05-29 ENCOUNTER — Telehealth (INDEPENDENT_AMBULATORY_CARE_PROVIDER_SITE_OTHER): Payer: Medicare Other | Admitting: Bariatrics

## 2019-05-29 ENCOUNTER — Other Ambulatory Visit: Payer: Self-pay

## 2019-05-29 DIAGNOSIS — E559 Vitamin D deficiency, unspecified: Secondary | ICD-10-CM | POA: Diagnosis not present

## 2019-05-29 DIAGNOSIS — E119 Type 2 diabetes mellitus without complications: Secondary | ICD-10-CM | POA: Diagnosis not present

## 2019-05-29 DIAGNOSIS — E669 Obesity, unspecified: Secondary | ICD-10-CM

## 2019-05-29 DIAGNOSIS — G4733 Obstructive sleep apnea (adult) (pediatric): Secondary | ICD-10-CM | POA: Diagnosis not present

## 2019-05-29 DIAGNOSIS — Z6834 Body mass index (BMI) 34.0-34.9, adult: Secondary | ICD-10-CM | POA: Diagnosis not present

## 2019-05-29 NOTE — Telephone Encounter (Signed)
Pt called in to say that they are going back w/home health and would like orders to draw at home will give verbal orders to [facility] at [phone]

## 2019-05-30 ENCOUNTER — Other Ambulatory Visit: Payer: Self-pay

## 2019-05-30 DIAGNOSIS — E669 Obesity, unspecified: Secondary | ICD-10-CM | POA: Diagnosis not present

## 2019-05-30 DIAGNOSIS — I872 Venous insufficiency (chronic) (peripheral): Secondary | ICD-10-CM | POA: Diagnosis not present

## 2019-05-30 DIAGNOSIS — E785 Hyperlipidemia, unspecified: Secondary | ICD-10-CM | POA: Diagnosis not present

## 2019-05-30 DIAGNOSIS — G35 Multiple sclerosis: Secondary | ICD-10-CM | POA: Diagnosis not present

## 2019-05-30 DIAGNOSIS — I10 Essential (primary) hypertension: Secondary | ICD-10-CM | POA: Diagnosis not present

## 2019-05-30 DIAGNOSIS — I4891 Unspecified atrial fibrillation: Secondary | ICD-10-CM | POA: Diagnosis not present

## 2019-05-30 LAB — POCT INR: INR: 3 (ref 2.0–3.0)

## 2019-06-01 NOTE — Progress Notes (Signed)
Office: [phone]  /  Fax: [phone] TeleHealth Visit:  Toni [name] has verbally consented to this TeleHealth visit today. The patient is located at home, the provider is located at the [employer] [facility] office. The participants in this visit include the listed provider and patient. Toni [name] was unable to use realtime audiovisual technology today and the telehealth visit was conducted via telephone (22 minutes).   HPI:   Chief Complaint: OBESITY Toni [name] is here to discuss her progress with her obesity treatment plan. She is on the Category 1 plan and is following her eating plan approximately 80-90 % of the time. She states she is exercising 0 minutes 0 times per week. Toni [name] is not sure if she has lost or gained weight. She does not weigh herself at home. She states that she had onset MS exacerbation and stated medications.  We were unable to weigh the patient today for this TeleHealth visit. She is unsure if she has lost or gained weight since her last visit. She has lost 90 lbs since starting treatment with [name].  Diabetes II Toni [name] has a diagnosis of diabetes type II. Toni [name]'s labs varies in the normal range. She denies hypoglycemia. Last A1c was 5.0 and insulin of 7.1. She has been working on intensive lifestyle modifications including diet, exercise, and weight loss to help control her blood glucose levels.  Obstructive Sleep Apnea Toni [name] has a diagnosis of obstructive sleep apnea. She states she is getting restful sleep.  ASSESSMENT AND PLAN:  Type 2 diabetes mellitus without complication, without long-term current use of insulin (HCC)  Vitamin D deficiency  OSA (obstructive sleep apnea)  Class 1 obesity with serious comorbidity and body mass index (BMI) of 34.0 to 34.9 in adult, unspecified obesity type  PLAN:  Diabetes II Toni [name] has been given extensive diabetes education by myself today including ideal fasting and post-prandial blood  glucose readings, individual ideal Hgb A1c goals and hypoglycemia prevention. We discussed the importance of good blood sugar control to decrease the likelihood of diabetic complications such as nephropathy, neuropathy, limb loss, blindness, coronary artery disease, and death. We discussed the importance of intensive lifestyle modification including diet, exercise and weight loss as the first line treatment for diabetes. She is to decrease carbohydrates, increase protein, and activity. Toni [name] agrees to continue her diabetes medications and will follow up at the agreed upon time.  Obstructive Sleep Apnea Toni [name] is to continue to use her Bipap and she agrees to follow up with our clinic in 2 weeks.  Obesity Toni [name] is currently in the action stage of change. As such, her goal is to continue with weight loss efforts She has agreed to keep a food journal with 300-400 calories and 35+ grams of protein at supper daily and follow the Category 1 plan Toni [name] has been instructed to work up to a goal of 150 minutes of combined cardio and strengthening exercise per week for weight loss and overall health benefits. We discussed the following Behavioral Modification Strategies today: increasing lean protein intake, decreasing simple carbohydrates, increasing vegetables, decrease eating out, increase [name] intake, no skipping meals, work on meal planning and easy cooking plans, and keeping healthy foods in the home   Toni [name] has agreed to follow up with our clinic in 2 weeks with Dr. Leafy Ro. She was informed of the importance of frequent follow up visits to maximize her success with intensive lifestyle modifications for her multiple health conditions.  ALLERGIES: Allergies  Allergen Reactions  . Hydrocodone Nausea And  Vomiting    Hydrocodone causes vomiting  . Oxycodone Nausea And Vomiting    Oral oxycodone causes vomiting  . Chlorhexidine Gluconate Rash    Sores at site  . Zoloft [Sertraline]  Hives, Swelling and Rash    MEDICATIONS: Current Outpatient Medications on File Prior to Visit  Medication Sig Dispense Refill  . acetaminophen (TYLENOL) 500 MG tablet Take 500-1,000 mg by mouth every 6 (six) hours as needed for fever or headache (or pain).    Marland Kitchen ALPRAZolam (XANAX) 0.5 MG tablet TAKE 1 TABLET BY MOUTH AT  BEDTIME AS NEEDED FOR  ANXIETY OR SLEEP 30 tablet 0  . baclofen (LIORESAL) 20 MG tablet Once to two tablets a day for breakthrough spasms 180 tablet 0  . Biotin (PA BIOTIN) 1000 MCG tablet Take 1,000 mcg by mouth daily.    Marland Kitchen buPROPion (WELLBUTRIN XL) 300 MG 24 hr tablet Take 1 tablet (300 mg total) by mouth daily. 30 tablet 0  . CALCIUM CITRATE PO Take 2 tablets by mouth 2 (two) times daily.    . cetirizine (ZYRTEC) 10 MG tablet Take 10 mg by mouth daily.    . Cholecalciferol (VITAMIN D) 2000 units tablet Take 1 tablet (2,000 Units total) by mouth 2 (two) times daily.  0  . co-enzyme Q-10 30 MG capsule Take 30 mg by mouth daily.    . corticotropin (ACTHAR) 80 UNIT/ML injectable gel INJECT 80 UNITS (1ML) DAILY FOR 10 DAYS. DUE TO MS EXACERBATION AND THEN CAN RESUME NEXT MONTH AT PREVIOUS DOSE. 10 mL 0  . Cranberry 1000 MG CAPS Take by mouth.    . diltiazem (CARDIZEM CD) 180 MG 24 hr capsule TAKE 1 CAPSULE BY MOUTH  DAILY 90 capsule 3  . docusate sodium (COLACE) 100 MG capsule Take 1 capsule (100 mg total) by mouth daily. 10 capsule 0  . famotidine (PEPCID) 20 MG tablet TK 1 T PO D  6  . fluticasone (FLONASE) 50 MCG/ACT nasal spray Place 2 sprays into both nostrils daily as needed for allergies.     . furosemide (LASIX) 40 MG tablet Take 80 mg by mouth daily.     Marland Kitchen gabapentin (NEURONTIN) 300 MG capsule TAKE 1 CAPSULE BY MOUTH UP  TO 4 TIMES DAILY 360 capsule 3  . GENERLAC 10 GM/15ML SOLN TAKE 15 ML BY MOUTH TWICE DAILY AS NEEDED 946 mL 0  . levothyroxine (SYNTHROID, LEVOTHROID) 75 MCG tablet Take 75 mcg by mouth daily.    Marland Kitchen linaclotide (LINZESS) 145 MCG CAPS capsule Take 1  capsule (145 mcg total) by mouth daily before breakfast. 90 capsule 3  . Multiple Vitamins-Minerals (CENTRUM SILVER PO) Take 1 tablet by mouth daily.    . nebivolol (BYSTOLIC) 10 MG tablet Take 10 mg by mouth daily. Take 10mg  daily unless bp systolic is below [id number] then take 5mg     . Omega-3 Fatty Acids (FISH OIL) 1000 MG CAPS Take 1,000 mg by mouth daily.    Marland Kitchen omeprazole (PRILOSEC) 20 MG capsule Take 20 mg by mouth daily.     . psyllium (METAMUCIL SMOOTH TEXTURE) 28 % packet Take 1 packet by mouth every other day.    . simvastatin (ZOCOR) 10 MG tablet     . SYRINGE/NEEDLE, DISP, 1 ML (BD LUER-LOK SYRINGE) 25G X 5/8" 1 ML MISC Use as directed to inject  Acthar 10 each prn  . Tamsulosin HCl (FLOMAX) 0.4 MG CAPS Take 0.4 mg by mouth daily.    Marland Kitchen UNABLE TO FIND Baclofen pump: Continuous    .  warfarin (COUMADIN) 5 MG tablet TAKE 1 TABLET BY MOUTH  DAILY AS DIRECTED BY  COUMADIN CLINIC 90 tablet 1   Current Facility-Administered Medications on File Prior to Visit  Medication Dose Route Frequency Provider Last Rate Last Dose  . baclofen (LIORESAL) intrathecal injection 40 mg/62mL  40 mg Intrathecal Once Kathrynn Ducking, MD        PAST MEDICAL HISTORY: Past Medical History:  Diagnosis Date  . Abnormality of gait 03/01/2015  . Anxiety   . Arthritis    "knees" (01/19/2016)  . Atrial fibrillation with RVR ([place]) 01/19/2016  . Back pain   . Chest pain   . Childhood asthma   . Chronic pain    "nerve pain from the MS"  . Constipation   . Depression   . DVT (deep venous thrombosis) ([place]) 1983   "RLE; may have just been phlebitis; it was before the age of dopplers"  . 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 GERD (gastroesophageal reflux disease)   . History of stress test 06/21/10   limited exam with some degree of breast attenuatin of the apex however a component of apical ischemia is not excluded, EF 62%; cardiac cath   . HLD (hyperlipidemia)    . Hx of echocardiogram 04/22/10   EF 55-60%, no valve issues  . Hypertension   . Hypothyroidism   . Joint pain   . Migraine    "none since I stopped beta blocker in 1990s" (01/19/2016)  . Multiple sclerosis ([place])    has had this may 1989-[place] reg doc  . OSA on CPAP   . Osteoarthritis   . PAF (paroxysmal atrial fibrillation) ([place])    on coumadin; documented on monitor 06/2010  . Pneumonia 11/2011  . Shortness of breath   . Thyroid disease     PAST SURGICAL HISTORY: Past Surgical History:  Procedure Laterality Date  . ANTERIOR CERVICAL DECOMP/DISCECTOMY FUSION  01/2004  . BACK SURGERY    . CARDIAC CATHETERIZATION  06/22/2010   normal coronary arteries, PAF  . CATARACT EXTRACTION, BILATERAL Bilateral   . CHILD BIRTHS     X2  . DILATION AND CURETTAGE OF UTERUS    . INFUSION PUMP IMPLANTATION  ~ 2009   baclofen infusion in lower abd  . PAIN PUMP REVISION N/A 07/29/2014   Procedure: Baclofen pump replacement;  Surgeon: Erline Levine, MD;  Location: [facility];  Service: Neurosurgery;  Laterality: N/A;  Baclofen pump replacement  . TONSILLECTOMY AND ADENOIDECTOMY    . VARICOSE VEIN SURGERY  ~ 1968  . VENOUS ABLATION  12/16/2010   radiofreq ablation -Dr Elisabeth Cara and [facility]  . WISDOM TOOTH EXTRACTION      SOCIAL HISTORY: Social History   Tobacco Use  . Smoking status: Former Smoker    Packs/day: 0.50    Years: 7.00    Pack years: 3.50    Types: Cigarettes    Quit date: 08/21/1976    Years since quitting: 42.8  . Smokeless tobacco: Never Used  Substance Use Topics  . Alcohol use: Yes    Alcohol/week: 1.0 standard drinks    Types: 1 Glasses of wine per week  . Drug use: No    FAMILY HISTORY: Family History  Problem Relation Age of Onset  . Coronary artery disease Father        at age 103  . Hyperlipidemia Father   . Thyroid disease Father   .  Coronary artery disease Maternal Grandmother   . Depression Maternal Grandmother   . Cancer Paternal Grandmother   .  Depression Mother   . Sudden death Mother   . Anxiety disorder Mother   . Alcoholism Son     ROS: Review of Systems  Constitutional: Negative for weight loss.  Endo/Heme/Allergies:       Negative hypoglycemia    PHYSICAL EXAM: Pt in no acute distress  RECENT LABS AND TESTS: BMET    Component Value Date/Time   NA 145 (H) 03/19/2019 1517   K 4.2 03/19/2019 1517   CL 105 03/19/2019 1517   CO2 25 03/19/2019 1517   GLUCOSE 78 03/19/2019 1517   GLUCOSE 92 07/08/2017 1214   BUN 24 03/19/2019 1517   CREATININE 0.64 03/19/2019 1517   CALCIUM 9.0 03/19/2019 1517   GFRNONAA 90 03/19/2019 1517   GFRAA 104 03/19/2019 1517   Lab Results  Component Value Date   HGBA1C 5.0 03/19/2019   HGBA1C 5.3 09/11/2018   HGBA1C 5.3 04/23/2018   HGBA1C 5.2 10/01/2017   HGBA1C 5.4 03/08/2017   Lab Results  Component Value Date   INSULIN 7.1 03/19/2019   INSULIN 13.0 09/11/2018   INSULIN 7.8 04/23/2018   INSULIN 20.4 11/27/2017   INSULIN 20.4 03/08/2017   CBC    Component Value Date/Time   WBC 8.9 07/11/2018   WBC 8.4 07/08/2017 1214   RBC 4.83 07/08/2017 1214   HGB 14.6 07/11/2018   HGB 14.1 03/08/2017 1152   HCT 45 07/11/2018   HCT 43.0 03/08/2017 1152   PLT 185 10/01/2017   PLT 219 12/18/2014 1157   MCV 93.2 07/08/2017 1214   MCV 93 03/08/2017 1152   MCH 31.5 07/08/2017 1214   MCHC 33.8 07/08/2017 1214   RDW 13.3 07/08/2017 1214   RDW 13.3 03/08/2017 1152   LYMPHSABS 1.6 07/08/2017 1214   LYMPHSABS 2.2 03/08/2017 1152   MONOABS 1.3 (H) 07/08/2017 1214   EOSABS 0.2 07/08/2017 1214   EOSABS 0.1 03/08/2017 1152   BASOSABS 0.0 07/08/2017 1214   BASOSABS 0.0 03/08/2017 1152   Iron/TIBC/Ferritin/ %Sat No results found for: IRON, TIBC, FERRITIN, IRONPCTSAT Lipid Panel     Component Value Date/Time   CHOL 111 03/19/2019 1517   TRIG 53 03/19/2019 1517   HDL 57 03/19/2019 1517   CHOLHDL 3.0 04/22/2010 0536   VLDL 29 04/22/2010 0536   LDLCALC 43 03/19/2019 1517    Hepatic Function Panel     Component Value Date/Time   PROT 5.5 (L) 03/19/2019 1517   ALBUMIN 3.7 03/19/2019 1517   AST 12 03/19/2019 1517   ALT 22 03/19/2019 1517   ALKPHOS 48 03/19/2019 1517   BILITOT 0.2 03/19/2019 1517      Component Value Date/Time   TSH 2.060 03/19/2019 1517   TSH 0.651 11/27/2017 1011   TSH 2.410 03/08/2017 1152      I, Trixie Dredge, am acting as Location manager for [employer], DO  I have reviewed the above documentation for accuracy and completeness, and I agree with the above. Jearld Lesch, DO

## 2019-06-02 ENCOUNTER — Ambulatory Visit: Payer: Self-pay | Admitting: Diagnostic Radiology

## 2019-06-02 DIAGNOSIS — Z6832 Body mass index (BMI) 32.0-32.9, adult: Secondary | ICD-10-CM | POA: Diagnosis not present

## 2019-06-02 DIAGNOSIS — R300 Dysuria: Secondary | ICD-10-CM | POA: Diagnosis not present

## 2019-06-02 DIAGNOSIS — I4891 Unspecified atrial fibrillation: Secondary | ICD-10-CM | POA: Diagnosis not present

## 2019-06-02 DIAGNOSIS — G35 Multiple sclerosis: Secondary | ICD-10-CM | POA: Diagnosis not present

## 2019-06-02 DIAGNOSIS — C642 Malignant neoplasm of left kidney, except renal pelvis: Secondary | ICD-10-CM | POA: Diagnosis not present

## 2019-06-02 DIAGNOSIS — M8580 Other specified disorders of bone density and structure, unspecified site: Secondary | ICD-10-CM | POA: Diagnosis not present

## 2019-06-02 DIAGNOSIS — E785 Hyperlipidemia, unspecified: Secondary | ICD-10-CM | POA: Diagnosis not present

## 2019-06-02 DIAGNOSIS — J309 Allergic rhinitis, unspecified: Secondary | ICD-10-CM | POA: Diagnosis not present

## 2019-06-02 DIAGNOSIS — F418 Other specified anxiety disorders: Secondary | ICD-10-CM | POA: Diagnosis not present

## 2019-06-02 DIAGNOSIS — E039 Hypothyroidism, unspecified: Secondary | ICD-10-CM | POA: Diagnosis not present

## 2019-06-02 DIAGNOSIS — Z7901 Long term (current) use of anticoagulants: Secondary | ICD-10-CM | POA: Diagnosis not present

## 2019-06-02 DIAGNOSIS — Z79899 Other long term (current) drug therapy: Secondary | ICD-10-CM | POA: Diagnosis not present

## 2019-06-02 DIAGNOSIS — Z9181 History of falling: Secondary | ICD-10-CM | POA: Diagnosis not present

## 2019-06-02 DIAGNOSIS — I10 Essential (primary) hypertension: Secondary | ICD-10-CM | POA: Diagnosis not present

## 2019-06-02 DIAGNOSIS — K5900 Constipation, unspecified: Secondary | ICD-10-CM | POA: Diagnosis not present

## 2019-06-02 DIAGNOSIS — E669 Obesity, unspecified: Secondary | ICD-10-CM | POA: Diagnosis not present

## 2019-06-02 DIAGNOSIS — K219 Gastro-esophageal reflux disease without esophagitis: Secondary | ICD-10-CM | POA: Diagnosis not present

## 2019-06-02 DIAGNOSIS — I872 Venous insufficiency (chronic) (peripheral): Secondary | ICD-10-CM | POA: Diagnosis not present

## 2019-06-02 DIAGNOSIS — N3289 Other specified disorders of bladder: Secondary | ICD-10-CM | POA: Diagnosis not present

## 2019-06-03 ENCOUNTER — Other Ambulatory Visit: Payer: Self-pay

## 2019-06-03 DIAGNOSIS — I872 Venous insufficiency (chronic) (peripheral): Secondary | ICD-10-CM

## 2019-06-04 DIAGNOSIS — I872 Venous insufficiency (chronic) (peripheral): Secondary | ICD-10-CM | POA: Diagnosis not present

## 2019-06-04 DIAGNOSIS — I10 Essential (primary) hypertension: Secondary | ICD-10-CM | POA: Diagnosis not present

## 2019-06-04 DIAGNOSIS — G35 Multiple sclerosis: Secondary | ICD-10-CM | POA: Diagnosis not present

## 2019-06-04 DIAGNOSIS — E669 Obesity, unspecified: Secondary | ICD-10-CM | POA: Diagnosis not present

## 2019-06-04 DIAGNOSIS — E785 Hyperlipidemia, unspecified: Secondary | ICD-10-CM | POA: Diagnosis not present

## 2019-06-04 DIAGNOSIS — I4891 Unspecified atrial fibrillation: Secondary | ICD-10-CM | POA: Diagnosis not present

## 2019-06-05 ENCOUNTER — Ambulatory Visit (HOSPITAL_COMMUNITY): Payer: Medicare Other

## 2019-06-05 ENCOUNTER — Ambulatory Visit: Payer: Medicare Other | Admitting: Vascular Surgery

## 2019-06-06 DIAGNOSIS — I10 Essential (primary) hypertension: Secondary | ICD-10-CM | POA: Diagnosis not present

## 2019-06-06 DIAGNOSIS — G35 Multiple sclerosis: Secondary | ICD-10-CM | POA: Diagnosis not present

## 2019-06-06 DIAGNOSIS — I4891 Unspecified atrial fibrillation: Secondary | ICD-10-CM | POA: Diagnosis not present

## 2019-06-06 DIAGNOSIS — E669 Obesity, unspecified: Secondary | ICD-10-CM | POA: Diagnosis not present

## 2019-06-06 DIAGNOSIS — E785 Hyperlipidemia, unspecified: Secondary | ICD-10-CM | POA: Diagnosis not present

## 2019-06-06 DIAGNOSIS — I872 Venous insufficiency (chronic) (peripheral): Secondary | ICD-10-CM | POA: Diagnosis not present

## 2019-06-10 ENCOUNTER — Other Ambulatory Visit: Payer: Self-pay | Admitting: Neurology

## 2019-06-10 DIAGNOSIS — E785 Hyperlipidemia, unspecified: Secondary | ICD-10-CM | POA: Diagnosis not present

## 2019-06-10 DIAGNOSIS — E669 Obesity, unspecified: Secondary | ICD-10-CM | POA: Diagnosis not present

## 2019-06-10 DIAGNOSIS — I872 Venous insufficiency (chronic) (peripheral): Secondary | ICD-10-CM | POA: Diagnosis not present

## 2019-06-10 DIAGNOSIS — G35 Multiple sclerosis: Secondary | ICD-10-CM | POA: Diagnosis not present

## 2019-06-10 DIAGNOSIS — I4891 Unspecified atrial fibrillation: Secondary | ICD-10-CM | POA: Diagnosis not present

## 2019-06-10 DIAGNOSIS — I10 Essential (primary) hypertension: Secondary | ICD-10-CM | POA: Diagnosis not present

## 2019-06-11 ENCOUNTER — Encounter (INDEPENDENT_AMBULATORY_CARE_PROVIDER_SITE_OTHER): Payer: Self-pay | Admitting: Family Medicine

## 2019-06-11 ENCOUNTER — Telehealth (INDEPENDENT_AMBULATORY_CARE_PROVIDER_SITE_OTHER): Payer: Medicare Other | Admitting: Family Medicine

## 2019-06-11 ENCOUNTER — Telehealth: Payer: Self-pay | Admitting: Neurology

## 2019-06-11 ENCOUNTER — Other Ambulatory Visit: Payer: Self-pay | Admitting: Neurology

## 2019-06-11 ENCOUNTER — Other Ambulatory Visit: Payer: Self-pay

## 2019-06-11 DIAGNOSIS — Z6834 Body mass index (BMI) 34.0-34.9, adult: Secondary | ICD-10-CM | POA: Diagnosis not present

## 2019-06-11 DIAGNOSIS — G35 Multiple sclerosis: Secondary | ICD-10-CM

## 2019-06-11 DIAGNOSIS — E669 Obesity, unspecified: Secondary | ICD-10-CM

## 2019-06-11 NOTE — Telephone Encounter (Signed)
Pt called wanting to speak to the RN about her attacks she has been having. Please advise.

## 2019-06-11 NOTE — Telephone Encounter (Signed)
Called the patient and she just wanted to provide an update. She has completed 10 shots of acthar and has been off of it for several days. She states that she is better then what she was but that she is not back to her baseline prior to the ms exacerbation.  She states that she has weakness in the extremities and not strong fine motor movement.  She still receiving PT twice a week and states that she is able to stand as long as she had been and her legs are buckling. She wanted to inform Dr Brett Fairy that the acthar has helped but she is just still having some difficulties. She wanted to ask since she is getting [facility] PT with advanced home therapy she was asking if she could get order for a [place]ga aid that could help her a few times a week with getting in and out of the shower. Advised that I would make sure Dr Brett Fairy is ok and we can attempt. I also informed her to talk with the PT person when they come tomorrow and that way they can help get the ball rolling.  The patient also wanted to ask if she could talk to Dr Brett Fairy about having additional doses of acthar ordered to have as extra's on hand. Patient states the patient assistance fun that she goes through no longer has funds in the MS program and as of dec 31,2020 she will be without the patient assistance and they typically covered her acthar copays for her. She realizes this may be a concern and she may not be able to but she wanted to bring it to her attention cause after 08/21/19 she will no longer be able to afford the acthar. Advised I will bring this information to Dr Brett Fairy and call and discuss with her. Pt verbalized understanding and was appreciative.

## 2019-06-11 NOTE — Telephone Encounter (Signed)
Spoke with Dr Brett Fairy order is placed for [facility] aide and she was agreeable to sending in a new script in  As well as sending  Script for more acthar. I will complete the order for this on Monday 10/26.

## 2019-06-12 ENCOUNTER — Other Ambulatory Visit: Payer: Self-pay

## 2019-06-12 DIAGNOSIS — I4891 Unspecified atrial fibrillation: Secondary | ICD-10-CM | POA: Diagnosis not present

## 2019-06-12 DIAGNOSIS — G35 Multiple sclerosis: Secondary | ICD-10-CM | POA: Diagnosis not present

## 2019-06-12 DIAGNOSIS — I872 Venous insufficiency (chronic) (peripheral): Secondary | ICD-10-CM | POA: Diagnosis not present

## 2019-06-12 DIAGNOSIS — E669 Obesity, unspecified: Secondary | ICD-10-CM | POA: Diagnosis not present

## 2019-06-12 DIAGNOSIS — E785 Hyperlipidemia, unspecified: Secondary | ICD-10-CM | POA: Diagnosis not present

## 2019-06-12 DIAGNOSIS — I10 Essential (primary) hypertension: Secondary | ICD-10-CM | POA: Diagnosis not present

## 2019-06-12 NOTE — Progress Notes (Signed)
Office: [phone]  /  Fax: [phone] TeleHealth Visit:  Toni [name] has verbally consented to this TeleHealth visit today. The patient is located at home, the provider is located at the [employer] [facility] office. The participants in this visit include the listed provider and patient. The visit was conducted today via face time.  HPI:   Chief Complaint: OBESITY Toni [name] is here to discuss her progress with her obesity treatment plan. She is on the Category 1 plan and is following her eating plan approximately 80 % of the time. She states she is exercising 0 minutes 0 times per week. Toni [name] has maintained her weight well. She has been working on journaling, and trying to meet her calories and protein goals. She is working with a physical therapist 2 times per week at her house since she has had a MS flare. She states her blood pressure was 114/61 with a pulse of 71. We were unable to weigh the patient today for this TeleHealth visit. She feels as if she has maintained her weight since her last visit. She has lost 90 lbs since starting treatment with [name].  Multiple Sclerosis Toni [name] states her MS worsened about 3 weeks ago and Dr. Brett Fairy increased her Acthar injections to daily for 10 days. She is finished with this and feels a bit better, but still struggles to sit upright for any length of time and is slightly slurring her words. She denies swallowing difficulties.  ASSESSMENT AND PLAN:  MS (multiple sclerosis) ([place])  Class 1 obesity with serious comorbidity and body mass index (BMI) of 34.0 to 34.9 in adult, unspecified obesity type  PLAN:  Multiple Sclerosis Toni [name] was encouraged to continue with physical therapy as directed and will follow up with Dr. Brett Fairy. We will continue to monitor, and she will continue to work on nutrition and exercise. Toni [name] agrees to follow up with our clinic in 2 weeks.  I spent > than 50% of the 15 minute visit on  counseling as documented in the note.  Obesity Toni [name] is currently in the action stage of change. As such, her goal is to continue with weight loss efforts She has agreed to keep a food journal with 1000-1200 calories and 75+ grams of protein daily Toni [name] has been instructed to work up to a goal of 150 minutes of combined cardio and strengthening exercise per week for weight loss and overall health benefits. We discussed the following Behavioral Modification Strategies today: increasing lean protein intake, work on meal planning and easy cooking plans, and keep a strict food journal Toni [name] is ok to use protein drinks if needed to maintain her protein intake for now. Premier protein drinks recommended.  Toni [name] has agreed to follow up with our clinic in 2 weeks. She was informed of the importance of frequent follow up visits to maximize her success with intensive lifestyle modifications for her multiple health conditions.  ALLERGIES: Allergies  Allergen Reactions   Hydrocodone Nausea And Vomiting    Hydrocodone causes vomiting   Oxycodone Nausea And Vomiting    Oral oxycodone causes vomiting   Chlorhexidine Gluconate Rash    Sores at site   Zoloft [Sertraline] Hives, Swelling and Rash    MEDICATIONS: Current Outpatient Medications on File Prior to Visit  Medication Sig Dispense Refill   acetaminophen (TYLENOL) 500 MG tablet Take 500-1,000 mg by mouth every 6 (six) hours as needed for fever or headache (or pain).     ALPRAZolam (XANAX) 0.5 MG tablet TAKE 1  TABLET BY MOUTH AT  BEDTIME AS NEEDED FOR  ANXIETY OR SLEEP 30 tablet 0   baclofen (LIORESAL) 20 MG tablet Once to two tablets a day for breakthrough spasms 180 tablet 0   Biotin (PA BIOTIN) 1000 MCG tablet Take 1,000 mcg by mouth daily.     buPROPion (WELLBUTRIN XL) 300 MG 24 hr tablet Take 1 tablet (300 mg total) by mouth daily. 30 tablet 0   CALCIUM CITRATE PO Take 2 tablets by mouth 2 (two) times daily.      cetirizine (ZYRTEC) 10 MG tablet Take 10 mg by mouth daily.     Cholecalciferol (VITAMIN D) 2000 units tablet Take 1 tablet (2,000 Units total) by mouth 2 (two) times daily.  0   co-enzyme Q-10 30 MG capsule Take 30 mg by mouth daily.     corticotropin (ACTHAR) 80 UNIT/ML injectable gel INJECT 80 UNITS (1ML)  SUBCUTANEOUSLY EVERY OTHER  DAY FOR 10 DAYS. (DISCARD  28 DAYS AFTER 1ST USE) 10 mL 5   Cranberry 1000 MG CAPS Take by mouth.     diltiazem (CARDIZEM CD) 180 MG 24 hr capsule TAKE 1 CAPSULE BY MOUTH  DAILY 90 capsule 3   docusate sodium (COLACE) 100 MG capsule Take 1 capsule (100 mg total) by mouth daily. 10 capsule 0   famotidine (PEPCID) 20 MG tablet TK 1 T PO D  6   fluticasone (FLONASE) 50 MCG/ACT nasal spray Place 2 sprays into both nostrils daily as needed for allergies.      furosemide (LASIX) 40 MG tablet Take 80 mg by mouth daily.      gabapentin (NEURONTIN) 300 MG capsule TAKE 1 CAPSULE BY MOUTH UP  TO 4 TIMES DAILY 360 capsule 3   GENERLAC 10 GM/15ML SOLN TAKE 15 ML BY MOUTH TWICE DAILY AS NEEDED 946 mL 0   levothyroxine (SYNTHROID, LEVOTHROID) 75 MCG tablet Take 75 mcg by mouth daily.     linaclotide (LINZESS) 145 MCG CAPS capsule Take 1 capsule (145 mcg total) by mouth daily before breakfast. 90 capsule 3   Multiple Vitamins-Minerals (CENTRUM SILVER PO) Take 1 tablet by mouth daily.     nebivolol (BYSTOLIC) 10 MG tablet Take 10 mg by mouth daily. Take 10mg  daily unless bp systolic is below [id number] then take 5mg      Omega-3 Fatty Acids (FISH OIL) 1000 MG CAPS Take 1,000 mg by mouth daily.     omeprazole (PRILOSEC) 20 MG capsule Take 20 mg by mouth daily.      psyllium (METAMUCIL SMOOTH TEXTURE) 28 % packet Take 1 packet by mouth every other day.     simvastatin (ZOCOR) 10 MG tablet      SYRINGE/NEEDLE, DISP, 1 ML (BD LUER-LOK SYRINGE) 25G X 5/8" 1 ML MISC Use as directed to inject  Acthar 10 each prn   Tamsulosin HCl (FLOMAX) 0.4 MG CAPS Take 0.4 mg by mouth  daily.     UNABLE TO FIND Baclofen pump: Continuous     warfarin (COUMADIN) 5 MG tablet TAKE 1 TABLET BY MOUTH  DAILY AS DIRECTED BY  COUMADIN CLINIC 90 tablet 1   Current Facility-Administered Medications on File Prior to Visit  Medication Dose Route Frequency Provider Last Rate Last Dose   baclofen (LIORESAL) intrathecal injection 40 mg/62mL  40 mg Intrathecal Once Kathrynn Ducking, MD        PAST MEDICAL HISTORY: Past Medical History:  Diagnosis Date   Abnormality of gait 03/01/2015   Anxiety    Arthritis    "  knees" (01/19/2016)   Atrial fibrillation with RVR ([place]) 01/19/2016   Back pain    Chest pain    Childhood asthma    Chronic pain    "nerve pain from the MS"   Constipation    Depression    DVT (deep venous thrombosis) ([place]) 1983   "RLE; may have just been phlebitis; it was before the age of dopplers"   Edema    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' ablation of R GSV 2012   GERD (gastroesophageal reflux disease)    History of stress test 06/21/10   limited exam with some degree of breast attenuatin of the apex however a component of apical ischemia is not excluded, EF 62%; cardiac cath    HLD (hyperlipidemia)    Hx of echocardiogram 04/22/10   EF 55-60%, no valve issues   Hypertension    Hypothyroidism    Joint pain    Migraine    "none since I stopped beta blocker in 1990s" (01/19/2016)   Multiple sclerosis ([place])    has had this may 1989-[facility] reg doc   OSA on CPAP    Osteoarthritis    PAF (paroxysmal atrial fibrillation) ([place])    on coumadin; documented on monitor 06/2010   Pneumonia 11/2011   Shortness of breath    Thyroid disease     PAST SURGICAL HISTORY: Past Surgical History:  Procedure Laterality Date   ANTERIOR CERVICAL DECOMP/DISCECTOMY FUSION  01/2004   BACK SURGERY     CARDIAC CATHETERIZATION  06/22/2010   normal coronary arteries, PAF   CATARACT EXTRACTION, BILATERAL Bilateral    CHILD  BIRTHS     X2   DILATION AND CURETTAGE OF UTERUS     INFUSION PUMP IMPLANTATION  ~ 2009   baclofen infusion in lower abd   PAIN PUMP REVISION N/A 07/29/2014   Procedure: Baclofen pump replacement;  Surgeon: Erline Levine, MD;  Location: [place] [facility];  Service: Neurosurgery;  Laterality: N/A;  Baclofen pump replacement   TONSILLECTOMY AND ADENOIDECTOMY     VARICOSE VEIN SURGERY  ~ 1968   VENOUS ABLATION  12/16/2010   radiofreq ablation -Dr Elisabeth Cara and [facility]   WISDOM TOOTH EXTRACTION      SOCIAL HISTORY: Social History   Tobacco Use   Smoking status: Former Smoker    Packs/day: 0.50    Years: 7.00    Pack years: 3.50    Types: Cigarettes    Quit date: 08/21/1976    Years since quitting: 42.8   Smokeless tobacco: Never Used  Substance Use Topics   Alcohol use: Yes    Alcohol/week: 1.0 standard drinks    Types: 1 Glasses of wine per week   Drug use: No    FAMILY HISTORY: Family History  Problem Relation Age of Onset   Coronary artery disease Father        at age 3   Hyperlipidemia Father    Thyroid disease Father    Coronary artery disease Maternal Grandmother    Depression Maternal Grandmother    Cancer Paternal Grandmother    Depression Mother    Sudden death Mother    Anxiety disorder Mother    Alcoholism Son     ROS: Review of Systems  Constitutional: Negative for weight loss.  Gastrointestinal:       Negative difficulty swallowing    PHYSICAL EXAM: Pt in no acute distress  RECENT LABS AND TESTS: BMET    Component Value Date/Time  NA 145 (H) 03/19/2019 1517   K 4.2 03/19/2019 1517   CL 105 03/19/2019 1517   CO2 25 03/19/2019 1517   GLUCOSE 78 03/19/2019 1517   GLUCOSE 92 07/08/2017 1214   BUN 24 03/19/2019 1517   CREATININE 0.64 03/19/2019 1517   CALCIUM 9.0 03/19/2019 1517   GFRNONAA 90 03/19/2019 1517   GFRAA 104 03/19/2019 1517   Lab Results  Component Value Date   HGBA1C 5.0 03/19/2019   HGBA1C 5.3 09/11/2018    HGBA1C 5.3 04/23/2018   HGBA1C 5.2 10/01/2017   HGBA1C 5.4 03/08/2017   Lab Results  Component Value Date   INSULIN 7.1 03/19/2019   INSULIN 13.0 09/11/2018   INSULIN 7.8 04/23/2018   INSULIN 20.4 11/27/2017   INSULIN 20.4 03/08/2017   CBC    Component Value Date/Time   WBC 8.9 07/11/2018   WBC 8.4 07/08/2017 1214   RBC 4.83 07/08/2017 1214   HGB 14.6 07/11/2018   HGB 14.1 03/08/2017 1152   HCT 45 07/11/2018   HCT 43.0 03/08/2017 1152   PLT 185 10/01/2017   PLT 219 12/18/2014 1157   MCV 93.2 07/08/2017 1214   MCV 93 03/08/2017 1152   MCH 31.5 07/08/2017 1214   MCHC 33.8 07/08/2017 1214   RDW 13.3 07/08/2017 1214   RDW 13.3 03/08/2017 1152   LYMPHSABS 1.6 07/08/2017 1214   LYMPHSABS 2.2 03/08/2017 1152   MONOABS 1.3 (H) 07/08/2017 1214   EOSABS 0.2 07/08/2017 1214   EOSABS 0.1 03/08/2017 1152   BASOSABS 0.0 07/08/2017 1214   BASOSABS 0.0 03/08/2017 1152   Iron/TIBC/Ferritin/ %Sat No results found for: IRON, TIBC, FERRITIN, IRONPCTSAT Lipid Panel     Component Value Date/Time   CHOL 111 03/19/2019 1517   TRIG 53 03/19/2019 1517   HDL 57 03/19/2019 1517   CHOLHDL 3.0 04/22/2010 0536   VLDL 29 04/22/2010 0536   LDLCALC 43 03/19/2019 1517   Hepatic Function Panel     Component Value Date/Time   PROT 5.5 (L) 03/19/2019 1517   ALBUMIN 3.7 03/19/2019 1517   AST 12 03/19/2019 1517   ALT 22 03/19/2019 1517   ALKPHOS 48 03/19/2019 1517   BILITOT 0.2 03/19/2019 1517      Component Value Date/Time   TSH 2.060 03/19/2019 1517   TSH 0.651 11/27/2017 1011   TSH 2.410 03/08/2017 1152      I, Trixie Dredge, am acting as Location manager for Dennard Nip, MD I have reviewed the above documentation for accuracy and completeness, and I agree with the above. -Dennard Nip, MD

## 2019-06-12 NOTE — Telephone Encounter (Signed)
Tim from [facility] called wanting to verify if the rx sent on 06/11/2019 needed to be filled and I advised the refill was needed due to the recent MS exacerbation. Tim verbalized understanding and will work on processing the order.

## 2019-06-18 ENCOUNTER — Other Ambulatory Visit: Payer: Self-pay | Admitting: Neurology

## 2019-06-18 DIAGNOSIS — I10 Essential (primary) hypertension: Secondary | ICD-10-CM | POA: Diagnosis not present

## 2019-06-18 DIAGNOSIS — E785 Hyperlipidemia, unspecified: Secondary | ICD-10-CM | POA: Diagnosis not present

## 2019-06-18 DIAGNOSIS — G35 Multiple sclerosis: Secondary | ICD-10-CM | POA: Diagnosis not present

## 2019-06-18 DIAGNOSIS — E669 Obesity, unspecified: Secondary | ICD-10-CM | POA: Diagnosis not present

## 2019-06-18 DIAGNOSIS — I872 Venous insufficiency (chronic) (peripheral): Secondary | ICD-10-CM | POA: Diagnosis not present

## 2019-06-18 DIAGNOSIS — I4891 Unspecified atrial fibrillation: Secondary | ICD-10-CM | POA: Diagnosis not present

## 2019-06-18 MED ORDER — ACTHAR 80 UNIT/ML IJ GEL: INTRAMUSCULAR | 1 refills | Status: DC

## 2019-06-18 NOTE — Telephone Encounter (Signed)
Called the patient to touch base with her, about whether the [facility] aide order had went through. She states that she had not heard anything. Advised that I would reach out and see what we can do to get that process started. She was appreciative for the call.  I will send a message to the [facility] to help

## 2019-06-19 ENCOUNTER — Ambulatory Visit (INDEPENDENT_AMBULATORY_CARE_PROVIDER_SITE_OTHER): Payer: Medicare Other | Admitting: Cardiology

## 2019-06-19 DIAGNOSIS — I10 Essential (primary) hypertension: Secondary | ICD-10-CM | POA: Diagnosis not present

## 2019-06-19 DIAGNOSIS — E669 Obesity, unspecified: Secondary | ICD-10-CM | POA: Diagnosis not present

## 2019-06-19 DIAGNOSIS — I872 Venous insufficiency (chronic) (peripheral): Secondary | ICD-10-CM | POA: Diagnosis not present

## 2019-06-19 DIAGNOSIS — I4891 Unspecified atrial fibrillation: Secondary | ICD-10-CM | POA: Diagnosis not present

## 2019-06-19 DIAGNOSIS — Z7901 Long term (current) use of anticoagulants: Secondary | ICD-10-CM | POA: Diagnosis not present

## 2019-06-19 DIAGNOSIS — E785 Hyperlipidemia, unspecified: Secondary | ICD-10-CM | POA: Diagnosis not present

## 2019-06-19 DIAGNOSIS — G35 Multiple sclerosis: Secondary | ICD-10-CM | POA: Diagnosis not present

## 2019-06-19 LAB — POCT INR: INR: 2.4 (ref 2.0–3.0)

## 2019-06-24 ENCOUNTER — Other Ambulatory Visit: Payer: Self-pay | Admitting: Neurology

## 2019-06-24 ENCOUNTER — Telehealth (HOSPITAL_COMMUNITY): Payer: Self-pay

## 2019-06-24 DIAGNOSIS — E669 Obesity, unspecified: Secondary | ICD-10-CM | POA: Diagnosis not present

## 2019-06-24 DIAGNOSIS — I4891 Unspecified atrial fibrillation: Secondary | ICD-10-CM | POA: Diagnosis not present

## 2019-06-24 DIAGNOSIS — I10 Essential (primary) hypertension: Secondary | ICD-10-CM | POA: Diagnosis not present

## 2019-06-24 DIAGNOSIS — E785 Hyperlipidemia, unspecified: Secondary | ICD-10-CM | POA: Diagnosis not present

## 2019-06-24 DIAGNOSIS — I872 Venous insufficiency (chronic) (peripheral): Secondary | ICD-10-CM | POA: Diagnosis not present

## 2019-06-24 DIAGNOSIS — Z23 Encounter for immunization: Secondary | ICD-10-CM | POA: Diagnosis not present

## 2019-06-24 DIAGNOSIS — G35 Multiple sclerosis: Secondary | ICD-10-CM | POA: Diagnosis not present

## 2019-06-24 NOTE — Telephone Encounter (Signed)

## 2019-06-25 ENCOUNTER — Telehealth (INDEPENDENT_AMBULATORY_CARE_PROVIDER_SITE_OTHER): Payer: Medicare Other | Admitting: Family Medicine

## 2019-06-25 ENCOUNTER — Encounter: Payer: Self-pay | Admitting: Vascular Surgery

## 2019-06-25 ENCOUNTER — Encounter (INDEPENDENT_AMBULATORY_CARE_PROVIDER_SITE_OTHER): Payer: Self-pay | Admitting: Family Medicine

## 2019-06-25 ENCOUNTER — Ambulatory Visit (INDEPENDENT_AMBULATORY_CARE_PROVIDER_SITE_OTHER): Payer: Medicare Other | Admitting: Vascular Surgery

## 2019-06-25 ENCOUNTER — Other Ambulatory Visit: Payer: Self-pay

## 2019-06-25 ENCOUNTER — Ambulatory Visit (HOSPITAL_COMMUNITY)
Admission: RE | Admit: 2019-06-25 | Discharge: 2019-06-25 | Disposition: A | Discharge status: Home / Self Care | Payer: Medicare Other | Source: Ambulatory Visit | Attending: Vascular Surgery | Admitting: Vascular Surgery

## 2019-06-25 VITALS — BP 126/74 | HR 84 | Temp 97.3°F | Resp 20 | Ht 62.5 in | Wt 186.0 lb

## 2019-06-25 DIAGNOSIS — E669 Obesity, unspecified: Secondary | ICD-10-CM

## 2019-06-25 DIAGNOSIS — I872 Venous insufficiency (chronic) (peripheral): Secondary | ICD-10-CM

## 2019-06-25 DIAGNOSIS — I10 Essential (primary) hypertension: Secondary | ICD-10-CM

## 2019-06-25 DIAGNOSIS — Z6834 Body mass index (BMI) 34.0-34.9, adult: Secondary | ICD-10-CM | POA: Diagnosis not present

## 2019-06-25 DIAGNOSIS — I83813 Varicose veins of bilateral lower extremities with pain: Secondary | ICD-10-CM | POA: Diagnosis not present

## 2019-06-25 NOTE — Progress Notes (Signed)
Office: [phone]  /  Fax: [phone] TeleHealth Visit:  Toni [name] has verbally consented to this TeleHealth visit today. The patient is located at home, the provider is located at the [employer] [facility] office. The participants in this visit include the listed provider and patient. The visit was conducted today via telephone call (FaceTime failed - changed to telephone call).  HPI:   Chief Complaint: OBESITY Toni [name] is here to discuss her progress with her obesity treatment plan. She is on the Category 1 plan and is following her eating plan approximately 90% of the time. She states she is doing physical therapy 30-60 minutes 5-7 times per week. Toni [name] continues to do well on her Category 1 plan. She is recovering from an MS flare and is unable to stand long enough to weigh herself, but feels she is continuing to lose weight. We were unable to weigh the patient today for this TeleHealth visit. She feels as if she has lost weight since her last visit. She has lost 61 lbs since starting treatment with [name].  Hypertension Toni [name] is a 73 y.o. female with hypertension. Toni [name] denies chest pain or shortness of breath on exertion. She is working weight loss to help control her blood pressure with the goal of decreasing her risk of heart attack and stroke. Toni [name]'s blood pressure is well controlled. She checked it at home today and it was 115/67. She is doing PT and states it is normal when they check it as well.  ASSESSMENT AND PLAN:  Essential hypertension  Class 1 obesity with serious comorbidity and body mass index (BMI) of 34.0 to 34.9 in adult, unspecified obesity type  PLAN:  Hypertension We discussed sodium restriction, working on healthy weight loss, and a regular exercise program as the means to achieve improved blood pressure control. Toni [name] agreed with this plan and agreed to follow up as directed. We will continue to monitor her blood  pressure as well as her progress with the above lifestyle modifications. Toni [name] was instructed to continue diet and exercise. She will watch for signs of hypotension as she continues her lifestyle modifications.  I spent > than 50% of the 15 minute visit on counseling as documented in the note.  Obesity Toni [name] is currently in the action stage of change. As such, her goal is to continue with weight loss efforts. She has agreed to follow the Category 1 plan. Toni [name] has been instructed to work up to a goal of 150 minutes of combined cardio and strengthening exercise per week for weight loss and overall health benefits. We discussed the following Behavioral Modification Strategies today: increase [name] intake and no skipping meals.  Toni [name] has agreed to follow-up with our clinic in 2-3 weeks. She was informed of the importance of frequent follow-up visits to maximize her success with intensive lifestyle modifications for her multiple health conditions.  ALLERGIES: Allergies  Allergen Reactions  . Hydrocodone Nausea And Vomiting    Hydrocodone causes vomiting  . Oxycodone Nausea And Vomiting    Oral oxycodone causes vomiting  . Chlorhexidine Gluconate Rash    Sores at site  . Zoloft [Sertraline] Hives, Swelling and Rash    MEDICATIONS: Current Outpatient Medications on File Prior to Visit  Medication Sig Dispense Refill  . acetaminophen (TYLENOL) 500 MG tablet Take 500-1,000 mg by mouth every 6 (six) hours as needed for fever or headache (or pain).    Marland Kitchen ALPRAZolam (XANAX) 0.5 MG tablet TAKE 1 TABLET BY  MOUTH AT  BEDTIME AS NEEDED FOR  ANXIETY OR SLEEP 30 tablet 0  . baclofen (LIORESAL) 20 MG tablet Once to two tablets a day for breakthrough spasms 180 tablet 0  . Biotin (PA BIOTIN) 1000 MCG tablet Take 1,000 mcg by mouth daily.    Marland Kitchen buPROPion (WELLBUTRIN XL) 300 MG 24 hr tablet Take 1 tablet (300 mg total) by mouth daily. 30 tablet 0  . CALCIUM CITRATE PO Take 2 tablets by mouth  2 (two) times daily.    . cetirizine (ZYRTEC) 10 MG tablet Take 10 mg by mouth daily.    . Cholecalciferol (VITAMIN D) 2000 units tablet Take 1 tablet (2,000 Units total) by mouth 2 (two) times daily.  0  . co-enzyme Q-10 30 MG capsule Take 30 mg by mouth daily.    . corticotropin (ACTHAR) 80 UNIT/ML injectable gel INJECT 80 UNITS (1ML) DAILY FOR 10 DAYS. DUE TO MS EXACERBATION AND THEN CAN RESUME NEXT MONTH AT PREVIOUS DOSE. 10 mL 1  . Cranberry 1000 MG CAPS Take by mouth.    . diltiazem (CARDIZEM CD) 180 MG 24 hr capsule TAKE 1 CAPSULE BY MOUTH  DAILY 90 capsule 3  . docusate sodium (COLACE) 100 MG capsule Take 1 capsule (100 mg total) by mouth daily. 10 capsule 0  . famotidine (PEPCID) 20 MG tablet TK 1 T PO D  6  . fluticasone (FLONASE) 50 MCG/ACT nasal spray Place 2 sprays into both nostrils daily as needed for allergies.     . furosemide (LASIX) 40 MG tablet Take 80 mg by mouth daily.     Marland Kitchen gabapentin (NEURONTIN) 300 MG capsule TAKE 1 CAPSULE BY MOUTH UP  TO 4 TIMES DAILY 360 capsule 3  . GENERLAC 10 GM/15ML SOLN TAKE 15 ML BY MOUTH TWICE DAILY AS NEEDED 946 mL 1  . levothyroxine (SYNTHROID, LEVOTHROID) 75 MCG tablet Take 75 mcg by mouth daily.    Marland Kitchen linaclotide (LINZESS) 145 MCG CAPS capsule Take 1 capsule (145 mcg total) by mouth daily before breakfast. 90 capsule 3  . Multiple Vitamins-Minerals (CENTRUM SILVER PO) Take 1 tablet by mouth daily.    . nebivolol (BYSTOLIC) 10 MG tablet Take 10 mg by mouth daily. Take 10mg  daily unless bp systolic is below [id number] then take 5mg     . Omega-3 Fatty Acids (FISH OIL) 1000 MG CAPS Take 1,000 mg by mouth daily.    Marland Kitchen omeprazole (PRILOSEC) 20 MG capsule Take 20 mg by mouth daily.     . psyllium (METAMUCIL SMOOTH TEXTURE) 28 % packet Take 1 packet by mouth every other day.    . simvastatin (ZOCOR) 10 MG tablet     . SYRINGE/NEEDLE, DISP, 1 ML (BD LUER-LOK SYRINGE) 25G X 5/8" 1 ML MISC Use as directed to inject  Acthar 10 each prn  . Tamsulosin HCl  (FLOMAX) 0.4 MG CAPS Take 0.4 mg by mouth daily.    Marland Kitchen UNABLE TO FIND Baclofen pump: Continuous    . warfarin (COUMADIN) 5 MG tablet TAKE 1 TABLET BY MOUTH  DAILY AS DIRECTED BY  COUMADIN CLINIC 90 tablet 1   Current Facility-Administered Medications on File Prior to Visit  Medication Dose Route Frequency Provider Last Rate Last Dose  . baclofen (LIORESAL) intrathecal injection 40 mg/22mL  40 mg Intrathecal Once Kathrynn Ducking, MD        PAST MEDICAL HISTORY: Past Medical History:  Diagnosis Date  . Abnormality of gait 03/01/2015  . Anxiety   . Arthritis    "  knees" (01/19/2016)  . Atrial fibrillation with RVR ([place]) 01/19/2016  . Back pain   . Chest pain   . Childhood asthma   . Chronic pain    "nerve pain from the MS"  . Constipation   . Depression   . DVT (deep venous thrombosis) ([place]) 1983   "RLE; may have just been phlebitis; it was before the age of dopplers"  . 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 GERD (gastroesophageal reflux disease)   . History of stress test 06/21/10   limited exam with some degree of breast attenuatin of the apex however a component of apical ischemia is not excluded, EF 62%; cardiac cath   . HLD (hyperlipidemia)   . Hx of echocardiogram 04/22/10   EF 55-60%, no valve issues  . Hypertension   . Hypothyroidism   . Joint pain   . Migraine    "none since I stopped beta blocker in 1990s" (01/19/2016)  . Multiple sclerosis ([place])    has had this may 1989-[facility] reg doc  . OSA on CPAP   . Osteoarthritis   . PAF (paroxysmal atrial fibrillation) ([place])    on coumadin; documented on monitor 06/2010  . Pneumonia 11/2011  . Shortness of breath   . Thyroid disease     PAST SURGICAL HISTORY: Past Surgical History:  Procedure Laterality Date  . ANTERIOR CERVICAL DECOMP/DISCECTOMY FUSION  01/2004  . BACK SURGERY    . CARDIAC CATHETERIZATION  06/22/2010   normal coronary arteries, PAF  . CATARACT  EXTRACTION, BILATERAL Bilateral   . CHILD BIRTHS     X2  . DILATION AND CURETTAGE OF UTERUS    . INFUSION PUMP IMPLANTATION  ~ 2009   baclofen infusion in lower abd  . PAIN PUMP REVISION N/A 07/29/2014   Procedure: Baclofen pump replacement;  Surgeon: Erline Levine, MD;  Location: [place] [facility];  Service: Neurosurgery;  Laterality: N/A;  Baclofen pump replacement  . TONSILLECTOMY AND ADENOIDECTOMY    . VARICOSE VEIN SURGERY  ~ 1968  . VENOUS ABLATION  12/16/2010   radiofreq ablation -Dr Elisabeth Cara and [facility]  . WISDOM TOOTH EXTRACTION      SOCIAL HISTORY: Social History   Tobacco Use  . Smoking status: Former Smoker    Packs/day: 0.50    Years: 7.00    Pack years: 3.50    Types: Cigarettes    Quit date: 08/21/1976    Years since quitting: 42.8  . Smokeless tobacco: Never Used  Substance Use Topics  . Alcohol use: Yes    Alcohol/week: 1.0 standard drinks    Types: 1 Glasses of wine per week  . Drug use: No    FAMILY HISTORY: Family History  Problem Relation Age of Onset  . Coronary artery disease Father        at age 56  . Hyperlipidemia Father   . Thyroid disease Father   . Coronary artery disease Maternal Grandmother   . Depression Maternal Grandmother   . Cancer Paternal Grandmother   . Depression Mother   . Sudden death Mother   . Anxiety disorder Mother   . Alcoholism Son    ROS: Review of Systems  Respiratory: Negative for shortness of breath.   Cardiovascular: Negative for chest pain.   PHYSICAL EXAM: Pt in no acute distress  RECENT LABS AND TESTS: BMET    Component Value Date/Time   NA 145 (H) 03/19/2019 1517  K 4.2 03/19/2019 1517   CL 105 03/19/2019 1517   CO2 25 03/19/2019 1517   GLUCOSE 78 03/19/2019 1517   GLUCOSE 92 07/08/2017 1214   BUN 24 03/19/2019 1517   CREATININE 0.64 03/19/2019 1517   CALCIUM 9.0 03/19/2019 1517   GFRNONAA 90 03/19/2019 1517   GFRAA 104 03/19/2019 1517   Lab Results  Component Value Date   HGBA1C 5.0 03/19/2019    HGBA1C 5.3 09/11/2018   HGBA1C 5.3 04/23/2018   HGBA1C 5.2 10/01/2017   HGBA1C 5.4 03/08/2017   Lab Results  Component Value Date   INSULIN 7.1 03/19/2019   INSULIN 13.0 09/11/2018   INSULIN 7.8 04/23/2018   INSULIN 20.4 11/27/2017   INSULIN 20.4 03/08/2017   CBC    Component Value Date/Time   WBC 8.9 07/11/2018   WBC 8.4 07/08/2017 1214   RBC 4.83 07/08/2017 1214   HGB 14.6 07/11/2018   HGB 14.1 03/08/2017 1152   HCT 45 07/11/2018   HCT 43.0 03/08/2017 1152   PLT 185 10/01/2017   PLT 219 12/18/2014 1157   MCV 93.2 07/08/2017 1214   MCV 93 03/08/2017 1152   MCH 31.5 07/08/2017 1214   MCHC 33.8 07/08/2017 1214   RDW 13.3 07/08/2017 1214   RDW 13.3 03/08/2017 1152   LYMPHSABS 1.6 07/08/2017 1214   LYMPHSABS 2.2 03/08/2017 1152   MONOABS 1.3 (H) 07/08/2017 1214   EOSABS 0.2 07/08/2017 1214   EOSABS 0.1 03/08/2017 1152   BASOSABS 0.0 07/08/2017 1214   BASOSABS 0.0 03/08/2017 1152   Iron/TIBC/Ferritin/ %Sat No results found for: IRON, TIBC, FERRITIN, IRONPCTSAT Lipid Panel     Component Value Date/Time   CHOL 111 03/19/2019 1517   TRIG 53 03/19/2019 1517   HDL 57 03/19/2019 1517   CHOLHDL 3.0 04/22/2010 0536   VLDL 29 04/22/2010 0536   LDLCALC 43 03/19/2019 1517   Hepatic Function Panel     Component Value Date/Time   PROT 5.5 (L) 03/19/2019 1517   ALBUMIN 3.7 03/19/2019 1517   AST 12 03/19/2019 1517   ALT 22 03/19/2019 1517   ALKPHOS 48 03/19/2019 1517   BILITOT 0.2 03/19/2019 1517      Component Value Date/Time   TSH 2.060 03/19/2019 1517   TSH 0.651 11/27/2017 1011   TSH 2.410 03/08/2017 1152   Results for Haak, Alek G "[name]" (MRN [id number]) as of 06/25/2019 15:39  Ref. Range 03/19/2019 15:17  Vitamin D, 25-Hydroxy Latest Ref Range: 30.0 - 100.0 ng/mL 67.7   I, Michaelene Song, am acting as Location manager for Dennard Nip, MD I have reviewed the above documentation for accuracy and completeness, and I agree with the above. -Dennard Nip, MD

## 2019-06-25 NOTE — Progress Notes (Signed)
Patient name: Toni [name] MRN: [id number] DOB: 08/19/1946 Sex: female  REASON FOR VISIT:   Follow-up of chronic venous insufficiency.  HPI:   Toni [name] is a pleasant 73 y.o. female who I saw initially in June of last year with bilateral lower extremity swelling.  She has had a previous ablation procedure of the right great saphenous vein in 2012.  She had a DVT in the left popliteal vein 30 years ago.  She is on Coumadin for atrial fibrillation.  She had a left lower extremity venous duplex scan at the time of her last visit which showed no evidence of DVT in the left lower extremity.  There was reflux in the common femoral vein and femoral vein on the left.  There was no significant superficial venous reflux.  She had evidence of chronic venous insufficiency bilaterally.  We discussed conservative treatment.  I was going to see her back as needed.  She complains of pain in her left leg more than her right. This is relieved with elevation  Her thigh high 20-30 stockings make here sx worse. Her activity is limited bc of her MS.   She also complains of some numbness in her right leg.  She does have a history of degenerative disc disease at L4-L5 and S1.  She is on Coumadin for atrial fibrillation.  Past Medical History:  Diagnosis Date   Abnormality of gait 03/01/2015   Anxiety    Arthritis    "knees" (01/19/2016)   Atrial fibrillation with RVR ([facility]) 01/19/2016   Back pain    Chest pain    Childhood asthma    Chronic pain    "nerve pain from the MS"   Constipation    Depression    DVT (deep venous thrombosis) ([place]) 1983   "RLE; may have just been phlebitis; it was before the age of dopplers"   Edema    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' ablation of R GSV 2012   GERD (gastroesophageal reflux disease)    History of stress test 06/21/10   limited exam with some degree of breast attenuatin of the apex however a component of apical ischemia is  not excluded, EF 62%; cardiac cath    HLD (hyperlipidemia)    Hx of echocardiogram 04/22/10   EF 55-60%, no valve issues   Hypertension    Hypothyroidism    Joint pain    Migraine    "none since I stopped beta blocker in 1990s" (01/19/2016)   Multiple sclerosis ([place])    has had this may 1989-[facility] reg doc   OSA on CPAP    Osteoarthritis    PAF (paroxysmal atrial fibrillation) ([place])    on coumadin; documented on monitor 06/2010   Pneumonia 11/2011   Shortness of breath    Thyroid disease     Family History  Problem Relation Age of Onset   Coronary artery disease Father        at age 59   Hyperlipidemia Father    Thyroid disease Father    Coronary artery disease Maternal Grandmother    Depression Maternal Grandmother    Cancer Paternal Grandmother    Depression Mother    Sudden death Mother    Anxiety disorder Mother    Alcoholism Son     SOCIAL HISTORY: Social History   Tobacco Use   Smoking status: Former Smoker    Packs/day: 0.50    Years: 7.00    Pack  years: 3.50    Types: Cigarettes    Quit date: 08/21/1976    Years since quitting: 42.8   Smokeless tobacco: Never Used  Substance Use Topics   Alcohol use: Yes    Alcohol/week: 1.0 standard drinks    Types: 1 Glasses of wine per week    Allergies  Allergen Reactions   Hydrocodone Nausea And Vomiting    Hydrocodone causes vomiting   Oxycodone Nausea And Vomiting    Oral oxycodone causes vomiting   Chlorhexidine Gluconate Rash    Sores at site   Zoloft [Sertraline] Hives, Swelling and Rash    Current Outpatient Medications  Medication Sig Dispense Refill   acetaminophen (TYLENOL) 500 MG tablet Take 500-1,000 mg by mouth every 6 (six) hours as needed for fever or headache (or pain).     ALPRAZolam (XANAX) 0.5 MG tablet TAKE 1 TABLET BY MOUTH AT  BEDTIME AS NEEDED FOR  ANXIETY OR SLEEP 30 tablet 0   baclofen (LIORESAL) 20 MG tablet Once to two tablets a day for breakthrough  spasms 180 tablet 0   Biotin (PA BIOTIN) 1000 MCG tablet Take 1,000 mcg by mouth daily.     buPROPion (WELLBUTRIN XL) 300 MG 24 hr tablet Take 1 tablet (300 mg total) by mouth daily. 30 tablet 0   CALCIUM CITRATE PO Take 2 tablets by mouth 2 (two) times daily.     cetirizine (ZYRTEC) 10 MG tablet Take 10 mg by mouth daily.     Cholecalciferol (VITAMIN D) 2000 units tablet Take 1 tablet (2,000 Units total) by mouth 2 (two) times daily.  0   co-enzyme Q-10 30 MG capsule Take 30 mg by mouth daily.     corticotropin (ACTHAR) 80 UNIT/ML injectable gel INJECT 80 UNITS (1ML) DAILY FOR 10 DAYS. DUE TO MS EXACERBATION AND THEN CAN RESUME NEXT MONTH AT PREVIOUS DOSE. 10 mL 1   Cranberry 1000 MG CAPS Take by mouth.     diltiazem (CARDIZEM CD) 180 MG 24 hr capsule TAKE 1 CAPSULE BY MOUTH  DAILY 90 capsule 3   docusate sodium (COLACE) 100 MG capsule Take 1 capsule (100 mg total) by mouth daily. 10 capsule 0   famotidine (PEPCID) 20 MG tablet TK 1 T PO D  6   fluticasone (FLONASE) 50 MCG/ACT nasal spray Place 2 sprays into both nostrils daily as needed for allergies.      furosemide (LASIX) 40 MG tablet Take 80 mg by mouth daily.      gabapentin (NEURONTIN) 300 MG capsule TAKE 1 CAPSULE BY MOUTH UP  TO 4 TIMES DAILY 360 capsule 3   GENERLAC 10 GM/15ML SOLN TAKE 15 ML BY MOUTH TWICE DAILY AS NEEDED 946 mL 1   levothyroxine (SYNTHROID, LEVOTHROID) 75 MCG tablet Take 75 mcg by mouth daily.     linaclotide (LINZESS) 145 MCG CAPS capsule Take 1 capsule (145 mcg total) by mouth daily before breakfast. 90 capsule 3   Multiple Vitamins-Minerals (CENTRUM SILVER PO) Take 1 tablet by mouth daily.     nebivolol (BYSTOLIC) 10 MG tablet Take 10 mg by mouth daily. Take 10mg  daily unless bp systolic is below [id number] then take 5mg      Omega-3 Fatty Acids (FISH OIL) 1000 MG CAPS Take 1,000 mg by mouth daily.     omeprazole (PRILOSEC) 20 MG capsule Take 20 mg by mouth daily.      psyllium (METAMUCIL SMOOTH  TEXTURE) 28 % packet Take 1 packet by mouth every other day.  simvastatin (ZOCOR) 10 MG tablet      SYRINGE/NEEDLE, DISP, 1 ML (BD LUER-LOK SYRINGE) 25G X 5/8" 1 ML MISC Use as directed to inject  Acthar 10 each prn   Tamsulosin HCl (FLOMAX) 0.4 MG CAPS Take 0.4 mg by mouth daily.     UNABLE TO FIND Baclofen pump: Continuous     warfarin (COUMADIN) 5 MG tablet TAKE 1 TABLET BY MOUTH  DAILY AS DIRECTED BY  COUMADIN CLINIC 90 tablet 1   Current Facility-Administered Medications  Medication Dose Route Frequency Provider Last Rate Last Dose   baclofen (LIORESAL) intrathecal injection 40 mg/44mL  40 mg Intrathecal Once Kathrynn Ducking, MD        REVIEW OF SYSTEMS:  [X]  denotes positive finding, [ ]  denotes negative finding Cardiac  Comments:  Chest pain or chest pressure:    Shortness of breath upon exertion:    Short of breath when lying flat:    Irregular heart rhythm: x       Vascular    Pain in calf, thigh, or hip brought on by ambulation: x   Pain in feet at night that wakes you up from your sleep:     Blood clot in your veins:    Leg swelling:  x       Pulmonary    Oxygen at home:    Productive cough:     Wheezing:         Neurologic    Sudden weakness in arms or legs:     Sudden numbness in arms or legs:     Sudden onset of difficulty speaking or slurred speech:    Temporary loss of vision in one eye:     Problems with dizziness:         Gastrointestinal    Blood in stool:     Vomited blood:         Genitourinary    Burning when urinating:     Blood in urine:        Psychiatric    Major depression:         Hematologic    Bleeding problems:    Problems with blood clotting too easily:        Skin    Rashes or ulcers:        Constitutional    Fever or chills:     PHYSICAL EXAM:   Vitals:   06/25/19 1547  BP: 126/74  Pulse: 84  Resp: 20  Temp: (!) 97.3 F (36.3 C)  SpO2: 100%  Weight: 186 lb (84.4 kg)  Height: 5' 2.5" (1.588 m)     GENERAL: The patient is a well-nourished female, in no acute distress. The vital signs are documented above. CARDIAC: There is a regular rate and rhythm.  VASCULAR: No bruits. Biphasic doppler signals right foot Monophasic but brisk doppler signals in left foot. Hyperpigmentation bilat.       PULMONARY: There is good air exchange bilaterally without wheezing or rales. ABDOMEN: Soft and non-tender with normal pitched bowel sounds.  MUSCULOSKELETAL: There are no major deformities or cyanosis. NEUROLOGIC: No focal weakness or paresthesias are detected. SKIN: There are no ulcers or rashes noted. PSYCHIATRIC: The patient has a normal affect.  DATA:    VENOUS DUPLEX: I have independently interpreted her venous duplex scan today.  On the right side there is no evidence of DVT.  She has previously had a right great saphenous vein ablated.  There is deep venous reflux involving  the common femoral vein, femoral vein, and popliteal vein.  There is reflux in a short segment of the small saphenous vein on the right.  On the left side there is no evidence of DVT.  There is deep venous reflux involving the common femoral vein.  There is superficial venous reflux in the great saphenous vein from the saphenofemoral junction to the calf.  The vein is dilated up to 0.64 cm in the thigh.  MEDICAL ISSUES:   CHRONIC VENOUS INSUFFICIENCY: We have discussed the importance of leg elevation and the proper position for this. I have asked her to try knee high compression stockings with a gradient of 15-20. I have encouraged her to avoid prolonged sitting and standing. I will see her back in Oct of 2021. If her symptoms progress, she may be a candidate for EVLA of her left GSV.   Deitra Mayo [facility] [phone]

## 2019-06-27 DIAGNOSIS — E669 Obesity, unspecified: Secondary | ICD-10-CM | POA: Diagnosis not present

## 2019-06-27 DIAGNOSIS — I4891 Unspecified atrial fibrillation: Secondary | ICD-10-CM | POA: Diagnosis not present

## 2019-06-27 DIAGNOSIS — G35 Multiple sclerosis: Secondary | ICD-10-CM | POA: Diagnosis not present

## 2019-06-27 DIAGNOSIS — I10 Essential (primary) hypertension: Secondary | ICD-10-CM | POA: Diagnosis not present

## 2019-06-27 DIAGNOSIS — I872 Venous insufficiency (chronic) (peripheral): Secondary | ICD-10-CM | POA: Diagnosis not present

## 2019-06-27 DIAGNOSIS — E785 Hyperlipidemia, unspecified: Secondary | ICD-10-CM | POA: Diagnosis not present

## 2019-07-02 ENCOUNTER — Other Ambulatory Visit: Payer: Self-pay

## 2019-07-02 ENCOUNTER — Encounter: Payer: Self-pay | Admitting: Neurology

## 2019-07-02 ENCOUNTER — Ambulatory Visit (INDEPENDENT_AMBULATORY_CARE_PROVIDER_SITE_OTHER): Payer: Medicare Other | Admitting: Neurology

## 2019-07-02 ENCOUNTER — Telehealth: Payer: Self-pay | Admitting: Neurology

## 2019-07-02 VITALS — BP 122/70 | HR 73 | Temp 97.7°F

## 2019-07-02 DIAGNOSIS — G35 Multiple sclerosis: Secondary | ICD-10-CM | POA: Diagnosis not present

## 2019-07-02 DIAGNOSIS — C642 Malignant neoplasm of left kidney, except renal pelvis: Secondary | ICD-10-CM | POA: Diagnosis not present

## 2019-07-02 DIAGNOSIS — Z79899 Other long term (current) drug therapy: Secondary | ICD-10-CM | POA: Diagnosis not present

## 2019-07-02 DIAGNOSIS — I872 Venous insufficiency (chronic) (peripheral): Secondary | ICD-10-CM | POA: Diagnosis not present

## 2019-07-02 DIAGNOSIS — Z6833 Body mass index (BMI) 33.0-33.9, adult: Secondary | ICD-10-CM | POA: Diagnosis not present

## 2019-07-02 DIAGNOSIS — K219 Gastro-esophageal reflux disease without esophagitis: Secondary | ICD-10-CM | POA: Diagnosis not present

## 2019-07-02 DIAGNOSIS — I4891 Unspecified atrial fibrillation: Secondary | ICD-10-CM | POA: Diagnosis not present

## 2019-07-02 DIAGNOSIS — E669 Obesity, unspecified: Secondary | ICD-10-CM | POA: Diagnosis not present

## 2019-07-02 DIAGNOSIS — R300 Dysuria: Secondary | ICD-10-CM | POA: Diagnosis not present

## 2019-07-02 DIAGNOSIS — E785 Hyperlipidemia, unspecified: Secondary | ICD-10-CM | POA: Diagnosis not present

## 2019-07-02 DIAGNOSIS — J309 Allergic rhinitis, unspecified: Secondary | ICD-10-CM | POA: Diagnosis not present

## 2019-07-02 DIAGNOSIS — M8580 Other specified disorders of bone density and structure, unspecified site: Secondary | ICD-10-CM | POA: Diagnosis not present

## 2019-07-02 DIAGNOSIS — E039 Hypothyroidism, unspecified: Secondary | ICD-10-CM | POA: Diagnosis not present

## 2019-07-02 DIAGNOSIS — Z9181 History of falling: Secondary | ICD-10-CM | POA: Diagnosis not present

## 2019-07-02 DIAGNOSIS — N3289 Other specified disorders of bladder: Secondary | ICD-10-CM | POA: Diagnosis not present

## 2019-07-02 DIAGNOSIS — I10 Essential (primary) hypertension: Secondary | ICD-10-CM | POA: Diagnosis not present

## 2019-07-02 DIAGNOSIS — Z7901 Long term (current) use of anticoagulants: Secondary | ICD-10-CM | POA: Diagnosis not present

## 2019-07-02 DIAGNOSIS — F418 Other specified anxiety disorders: Secondary | ICD-10-CM | POA: Diagnosis not present

## 2019-07-02 MED ORDER — BACLOFEN 40 MG/20ML IT SOLN
80.0000 mg | Freq: Once | INTRATHECAL | Status: AC
  Administered 2019-07-02: 80 mg via INTRATHECAL

## 2019-07-02 NOTE — Telephone Encounter (Signed)
I contacted the pt. She reports she did not sleep well last night and would like a later appt today or tomorrow.  I advised the latest appt I have is 07/02/19 at 12. I advised right now I did not have any openings.   Pt was also advised Dr. [name] would not be here next week. Pt was agreeable with coming in today at 1200. Pt was advised to arrive at least 15 mins prior.

## 2019-07-02 NOTE — Procedures (Signed)
     History:  Toni [name] is a 73 year old patient with a history of multiple sclerosis with a spastic paraparesis.  The patient has a baclofen pump in place and comes back for a baclofen pump refill.  She has noted some recent increase in spasticity, she desires to go up on the pump rate.  Baclofen pump refill note  The baclofen pump site was cleaned with Betadine solution. A 21-gauge needle was inserted into the pump port site. Approximately 6 cc of residual baclofen was removed, the baclofen pump indicates a 2.2 cc residual. 40 cc of replacement baclofen was placed into the pump at 2000 mcg/cc concentration.  The pump was reprogrammed for the following settings: The pump rate was increased from a simple continuous rate of 780.6 micrograms per day to a simple continuous rate of 799.5 mcg/day  The alarm volume is set at 1.5 cc. The next alarm date is October 06, 2019.  The patient tolerated the procedure well. There were no complications of the above procedure.  The [place] number is [id number] The baclofen expiration date is January 2022. The baclofen lot number is [id number].

## 2019-07-02 NOTE — Telephone Encounter (Signed)
Pt is calling in wanting to know if she can come in later this evening or tomorrow at noon to get her pump refilled. She stated she a exacerbation last night and has slept to well. Pt is requesting a call back

## 2019-07-03 DIAGNOSIS — I872 Venous insufficiency (chronic) (peripheral): Secondary | ICD-10-CM | POA: Diagnosis not present

## 2019-07-03 DIAGNOSIS — E669 Obesity, unspecified: Secondary | ICD-10-CM | POA: Diagnosis not present

## 2019-07-03 DIAGNOSIS — G35 Multiple sclerosis: Secondary | ICD-10-CM | POA: Diagnosis not present

## 2019-07-03 DIAGNOSIS — I10 Essential (primary) hypertension: Secondary | ICD-10-CM | POA: Diagnosis not present

## 2019-07-03 DIAGNOSIS — E785 Hyperlipidemia, unspecified: Secondary | ICD-10-CM | POA: Diagnosis not present

## 2019-07-03 DIAGNOSIS — I4891 Unspecified atrial fibrillation: Secondary | ICD-10-CM | POA: Diagnosis not present

## 2019-07-04 DIAGNOSIS — E785 Hyperlipidemia, unspecified: Secondary | ICD-10-CM | POA: Diagnosis not present

## 2019-07-04 DIAGNOSIS — E669 Obesity, unspecified: Secondary | ICD-10-CM | POA: Diagnosis not present

## 2019-07-04 DIAGNOSIS — G35 Multiple sclerosis: Secondary | ICD-10-CM | POA: Diagnosis not present

## 2019-07-04 DIAGNOSIS — I872 Venous insufficiency (chronic) (peripheral): Secondary | ICD-10-CM | POA: Diagnosis not present

## 2019-07-04 DIAGNOSIS — I4891 Unspecified atrial fibrillation: Secondary | ICD-10-CM | POA: Diagnosis not present

## 2019-07-04 DIAGNOSIS — I10 Essential (primary) hypertension: Secondary | ICD-10-CM | POA: Diagnosis not present

## 2019-07-08 DIAGNOSIS — E669 Obesity, unspecified: Secondary | ICD-10-CM | POA: Diagnosis not present

## 2019-07-08 DIAGNOSIS — I872 Venous insufficiency (chronic) (peripheral): Secondary | ICD-10-CM | POA: Diagnosis not present

## 2019-07-08 DIAGNOSIS — G35 Multiple sclerosis: Secondary | ICD-10-CM | POA: Diagnosis not present

## 2019-07-08 DIAGNOSIS — I10 Essential (primary) hypertension: Secondary | ICD-10-CM | POA: Diagnosis not present

## 2019-07-08 DIAGNOSIS — I4891 Unspecified atrial fibrillation: Secondary | ICD-10-CM | POA: Diagnosis not present

## 2019-07-08 DIAGNOSIS — E785 Hyperlipidemia, unspecified: Secondary | ICD-10-CM | POA: Diagnosis not present

## 2019-07-09 ENCOUNTER — Telehealth (INDEPENDENT_AMBULATORY_CARE_PROVIDER_SITE_OTHER): Payer: Medicare Other | Admitting: Family Medicine

## 2019-07-09 ENCOUNTER — Other Ambulatory Visit: Payer: Self-pay

## 2019-07-09 ENCOUNTER — Encounter (INDEPENDENT_AMBULATORY_CARE_PROVIDER_SITE_OTHER): Payer: Self-pay | Admitting: Family Medicine

## 2019-07-09 DIAGNOSIS — Z6833 Body mass index (BMI) 33.0-33.9, adult: Secondary | ICD-10-CM

## 2019-07-09 DIAGNOSIS — I10 Essential (primary) hypertension: Secondary | ICD-10-CM

## 2019-07-09 DIAGNOSIS — E66811 Obesity, class 1: Secondary | ICD-10-CM

## 2019-07-09 DIAGNOSIS — E669 Obesity, unspecified: Secondary | ICD-10-CM | POA: Diagnosis not present

## 2019-07-11 DIAGNOSIS — I872 Venous insufficiency (chronic) (peripheral): Secondary | ICD-10-CM | POA: Diagnosis not present

## 2019-07-11 DIAGNOSIS — E669 Obesity, unspecified: Secondary | ICD-10-CM | POA: Diagnosis not present

## 2019-07-11 DIAGNOSIS — E785 Hyperlipidemia, unspecified: Secondary | ICD-10-CM | POA: Diagnosis not present

## 2019-07-11 DIAGNOSIS — I4891 Unspecified atrial fibrillation: Secondary | ICD-10-CM | POA: Diagnosis not present

## 2019-07-11 DIAGNOSIS — G35 Multiple sclerosis: Secondary | ICD-10-CM | POA: Diagnosis not present

## 2019-07-11 DIAGNOSIS — I10 Essential (primary) hypertension: Secondary | ICD-10-CM | POA: Diagnosis not present

## 2019-07-13 DIAGNOSIS — G35 Multiple sclerosis: Secondary | ICD-10-CM | POA: Diagnosis not present

## 2019-07-13 DIAGNOSIS — I10 Essential (primary) hypertension: Secondary | ICD-10-CM | POA: Diagnosis not present

## 2019-07-13 DIAGNOSIS — I4891 Unspecified atrial fibrillation: Secondary | ICD-10-CM | POA: Diagnosis not present

## 2019-07-13 DIAGNOSIS — E785 Hyperlipidemia, unspecified: Secondary | ICD-10-CM | POA: Diagnosis not present

## 2019-07-13 DIAGNOSIS — I872 Venous insufficiency (chronic) (peripheral): Secondary | ICD-10-CM | POA: Diagnosis not present

## 2019-07-13 DIAGNOSIS — E669 Obesity, unspecified: Secondary | ICD-10-CM | POA: Diagnosis not present

## 2019-07-14 NOTE — Progress Notes (Signed)
**Note Toni-Identified via Obfuscation** Office: [phone]  /  Fax: [phone] TeleHealth Visit:  Toni [name] has verbally consented to this TeleHealth visit today. The patient is located at home, the provider is located at the [employer] [facility] office. The participants in this visit include the listed provider and patient. Toni [name] was unable to use realtime audiovisual technology today and the telehealth visit was conducted via telephone.   HPI:   Chief Complaint: OBESITY Toni [name] is here to discuss her progress with her obesity treatment plan. She is on the Category 1 plan and is following her eating plan approximately 85 % of the time. She states she is doing physical therapy for 60 minutes 2 times per week, and physical therapy at home for 15-20 minutes 4 times per week. Toni [name] continues to do well maintaining her weight loss. She has questions about Thanksgiving this year and COVID19. Her hunger is controlled.  We were unable to weigh the patient today for this TeleHealth visit. She feels as if she has maintained her weight since her last visit. She has lost 61 lbs since starting treatment with [name].  Hypertension Toni [name] is a 73 y.o. female with hypertension. Toni [name] Bastolic was decreased and her blood pressure was 128/71 today before taking her Diltiazem. She is working on weight loss to help control her blood pressure with the goal of decreasing her risk of heart attack and stroke. Toni [name]'s blood pressure is currently controlled.  ASSESSMENT AND PLAN:  Essential hypertension  Class 1 obesity with serious comorbidity and body mass index (BMI) of 33.0 to 33.9 in adult, unspecified obesity type  PLAN:  Hypertension We discussed sodium restriction, working on healthy weight loss, and a regular exercise program as the means to achieve improved blood pressure control. Toni [name] agreed with this plan and agreed to follow up as directed. We will continue to monitor her blood pressure as  well as her progress with the above lifestyle modifications. Toni [name] was advised to contact her primary care physician about decrease in her medications, as she has lost a significant amount of weight and she agreed. She will watch for signs of hypotension as she continues her lifestyle modifications. Toni [name] agrees to follow up with our clinic in 3 to 4 weeks.  I spent > than 50% of the 22 minute visit on counseling as documented in the note.  Obesity Toni [name] is currently in the action stage of change. As such, her goal is to continue with weight loss efforts She has agreed to keep a food journal with 1000-1200 calories and 70+ grams of protein daily Toni [name] has been instructed to work up to a goal of 150 minutes of combined cardio and strengthening exercise per week for weight loss and overall health benefits. We discussed the following Behavioral Modification Strategies today: holiday eating strategies  We will send all recipes to the patient through MyChart (recipe packet and shredded chicken).  Toni [name] has agreed to follow up with our clinic in 3 to 4 weeks. She was informed of the importance of frequent follow up visits to maximize her success with intensive lifestyle modifications for her multiple health conditions.  ALLERGIES: Allergies  Allergen Reactions  . Hydrocodone Nausea And Vomiting    Hydrocodone causes vomiting  . Oxycodone Nausea And Vomiting    Oral oxycodone causes vomiting  . Chlorhexidine Gluconate Rash    Sores at site  . Zoloft [Sertraline] Hives, Swelling and Rash    MEDICATIONS: Current Outpatient Medications on File Prior to Visit  Medication  Sig Dispense Refill  . acetaminophen (TYLENOL) 500 MG tablet Take 500-1,000 mg by mouth every 6 (six) hours as needed for fever or headache (or pain).    Marland Kitchen ALPRAZolam (XANAX) 0.5 MG tablet TAKE 1 TABLET BY MOUTH AT  BEDTIME AS NEEDED FOR  ANXIETY OR SLEEP 30 tablet 0  . baclofen (LIORESAL) 20 MG tablet Once to  two tablets a day for breakthrough spasms 180 tablet 0  . Biotin (PA BIOTIN) 1000 MCG tablet Take 1,000 mcg by mouth daily.    Marland Kitchen buPROPion (WELLBUTRIN XL) 300 MG 24 hr tablet Take 1 tablet (300 mg total) by mouth daily. 30 tablet 0  . CALCIUM CITRATE PO Take 2 tablets by mouth 2 (two) times daily.    . cetirizine (ZYRTEC) 10 MG tablet Take 10 mg by mouth daily.    . Cholecalciferol (VITAMIN D) 2000 units tablet Take 1 tablet (2,000 Units total) by mouth 2 (two) times daily.  0  . co-enzyme Q-10 30 MG capsule Take 30 mg by mouth daily.    . corticotropin (ACTHAR) 80 UNIT/ML injectable gel INJECT 80 UNITS (1ML) DAILY FOR 10 DAYS. DUE TO MS EXACERBATION AND THEN CAN RESUME NEXT MONTH AT PREVIOUS DOSE. 10 mL 1  . Cranberry 1000 MG CAPS Take by mouth.    . diltiazem (CARDIZEM CD) 180 MG 24 hr capsule TAKE 1 CAPSULE BY MOUTH  DAILY 90 capsule 3  . docusate sodium (COLACE) 100 MG capsule Take 1 capsule (100 mg total) by mouth daily. 10 capsule 0  . famotidine (PEPCID) 20 MG tablet TK 1 T PO D  6  . fluticasone (FLONASE) 50 MCG/ACT nasal spray Place 2 sprays into both nostrils daily as needed for allergies.     . furosemide (LASIX) 40 MG tablet Take 80 mg by mouth daily.     Marland Kitchen gabapentin (NEURONTIN) 300 MG capsule TAKE 1 CAPSULE BY MOUTH UP  TO 4 TIMES DAILY 360 capsule 3  . GENERLAC 10 GM/15ML SOLN TAKE 15 ML BY MOUTH TWICE DAILY AS NEEDED 946 mL 1  . levothyroxine (SYNTHROID, LEVOTHROID) 75 MCG tablet Take 75 mcg by mouth daily.    Marland Kitchen linaclotide (LINZESS) 145 MCG CAPS capsule Take 1 capsule (145 mcg total) by mouth daily before breakfast. 90 capsule 3  . Multiple Vitamins-Minerals (CENTRUM SILVER PO) Take 1 tablet by mouth daily.    . nebivolol (BYSTOLIC) 10 MG tablet Take 10 mg by mouth daily. Take 10mg  daily unless bp systolic is below 123XX123 then take 5mg     . Omega-3 Fatty Acids (FISH OIL) 1000 MG CAPS Take 1,000 mg by mouth daily.    Marland Kitchen omeprazole (PRILOSEC) 20 MG capsule Take 20 mg by mouth daily.      . psyllium (METAMUCIL SMOOTH TEXTURE) 28 % packet Take 1 packet by mouth every other day.    . simvastatin (ZOCOR) 10 MG tablet     . SYRINGE/NEEDLE, DISP, 1 ML (BD LUER-LOK SYRINGE) 25G X 5/8" 1 ML MISC Use as directed to inject  Acthar 10 each prn  . Tamsulosin HCl (FLOMAX) 0.4 MG CAPS Take 0.4 mg by mouth daily.    Marland Kitchen UNABLE TO FIND Baclofen pump: Continuous    . warfarin (COUMADIN) 5 MG tablet TAKE 1 TABLET BY MOUTH  DAILY AS DIRECTED BY  COUMADIN CLINIC 90 tablet 1   Current Facility-Administered Medications on File Prior to Visit  Medication Dose Route Frequency Provider Last Rate Last Dose  . baclofen (LIORESAL) intrathecal injection 40 mg/10mL  40 mg Intrathecal Once Kathrynn Ducking, MD        PAST MEDICAL HISTORY: Past Medical History:  Diagnosis Date  . Abnormality of gait 03/01/2015  . Anxiety   . Arthritis    "knees" (01/19/2016)  . Atrial fibrillation with RVR ([place]) 01/19/2016  . Back pain   . Chest pain   . Childhood asthma   . Chronic pain    "nerve pain from the MS"  . Constipation   . Depression   . DVT (deep venous thrombosis) ([place]) 1983   "RLE; may have just been phlebitis; it was before the age of dopplers"  . 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 GERD (gastroesophageal reflux disease)   . History of stress test 06/21/10   limited exam with some degree of breast attenuatin of the apex however a component of apical ischemia is not excluded, EF 62%; cardiac cath   . HLD (hyperlipidemia)   . Hx of echocardiogram 04/22/10   EF 55-60%, no valve issues  . Hypertension   . Hypothyroidism   . Joint pain   . Migraine    "none since I stopped beta blocker in 1990s" (01/19/2016)  . Multiple sclerosis ([place])    has had this may 1989-[facility] reg doc  . OSA on CPAP   . Osteoarthritis   . PAF (paroxysmal atrial fibrillation) ([place])    on coumadin; documented on monitor 06/2010  . Pneumonia 11/2011  . Shortness of  breath   . Thyroid disease     PAST SURGICAL HISTORY: Past Surgical History:  Procedure Laterality Date  . ANTERIOR CERVICAL DECOMP/DISCECTOMY FUSION  01/2004  . BACK SURGERY    . CARDIAC CATHETERIZATION  06/22/2010   normal coronary arteries, PAF  . CATARACT EXTRACTION, BILATERAL Bilateral   . CHILD BIRTHS     X2  . DILATION AND CURETTAGE OF UTERUS    . INFUSION PUMP IMPLANTATION  ~ 2009   baclofen infusion in lower abd  . PAIN PUMP REVISION N/A 07/29/2014   Procedure: Baclofen pump replacement;  Surgeon: Erline Levine, MD;  Location: [facility];  Service: Neurosurgery;  Laterality: N/A;  Baclofen pump replacement  . TONSILLECTOMY AND ADENOIDECTOMY    . VARICOSE VEIN SURGERY  ~ 1968  . VENOUS ABLATION  12/16/2010   radiofreq ablation -Dr Elisabeth Cara and [facility]  . WISDOM TOOTH EXTRACTION      SOCIAL HISTORY: Social History   Tobacco Use  . Smoking status: Former Smoker    Packs/day: 0.50    Years: 7.00    Pack years: 3.50    Types: Cigarettes    Quit date: 08/21/1976    Years since quitting: 42.9  . Smokeless tobacco: Never Used  Substance Use Topics  . Alcohol use: Yes    Alcohol/week: 1.0 standard drinks    Types: 1 Glasses of wine per week  . Drug use: No    FAMILY HISTORY: Family History  Problem Relation Age of Onset  . Coronary artery disease Father        at age 44  . Hyperlipidemia Father   . Thyroid disease Father   . Coronary artery disease Maternal Grandmother   . Depression Maternal Grandmother   . Cancer Paternal Grandmother   . Depression Mother   . Sudden death Mother   . Anxiety disorder Mother   . Alcoholism Son     ROS: Review of Systems  Constitutional: Negative for weight loss.  Cardiovascular: Negative for chest pain.    PHYSICAL EXAM: Pt in no acute distress  RECENT LABS AND TESTS: BMET    Component Value Date/Time   NA 145 (H) 03/19/2019 1517   K 4.2 03/19/2019 1517   CL 105 03/19/2019 1517   CO2 25 03/19/2019 1517   GLUCOSE  78 03/19/2019 1517   GLUCOSE 92 07/08/2017 1214   BUN 24 03/19/2019 1517   CREATININE 0.64 03/19/2019 1517   CALCIUM 9.0 03/19/2019 1517   GFRNONAA 90 03/19/2019 1517   GFRAA 104 03/19/2019 1517   Lab Results  Component Value Date   HGBA1C 5.0 03/19/2019   HGBA1C 5.3 09/11/2018   HGBA1C 5.3 04/23/2018   HGBA1C 5.2 10/01/2017   HGBA1C 5.4 03/08/2017   Lab Results  Component Value Date   INSULIN 7.1 03/19/2019   INSULIN 13.0 09/11/2018   INSULIN 7.8 04/23/2018   INSULIN 20.4 11/27/2017   INSULIN 20.4 03/08/2017   CBC    Component Value Date/Time   WBC 8.9 07/11/2018   WBC 8.4 07/08/2017 1214   RBC 4.83 07/08/2017 1214   HGB 14.6 07/11/2018   HGB 14.1 03/08/2017 1152   HCT 45 07/11/2018   HCT 43.0 03/08/2017 1152   PLT 185 10/01/2017   PLT 219 12/18/2014 1157   MCV 93.2 07/08/2017 1214   MCV 93 03/08/2017 1152   MCH 31.5 07/08/2017 1214   MCHC 33.8 07/08/2017 1214   RDW 13.3 07/08/2017 1214   RDW 13.3 03/08/2017 1152   LYMPHSABS 1.6 07/08/2017 1214   LYMPHSABS 2.2 03/08/2017 1152   MONOABS 1.3 (H) 07/08/2017 1214   EOSABS 0.2 07/08/2017 1214   EOSABS 0.1 03/08/2017 1152   BASOSABS 0.0 07/08/2017 1214   BASOSABS 0.0 03/08/2017 1152   Iron/TIBC/Ferritin/ %Sat No results found for: IRON, TIBC, FERRITIN, IRONPCTSAT Lipid Panel     Component Value Date/Time   CHOL 111 03/19/2019 1517   TRIG 53 03/19/2019 1517   HDL 57 03/19/2019 1517   CHOLHDL 3.0 04/22/2010 0536   VLDL 29 04/22/2010 0536   LDLCALC 43 03/19/2019 1517   Hepatic Function Panel     Component Value Date/Time   PROT 5.5 (L) 03/19/2019 1517   ALBUMIN 3.7 03/19/2019 1517   AST 12 03/19/2019 1517   ALT 22 03/19/2019 1517   ALKPHOS 48 03/19/2019 1517   BILITOT 0.2 03/19/2019 1517      Component Value Date/Time   TSH 2.060 03/19/2019 1517   TSH 0.651 11/27/2017 1011   TSH 2.410 03/08/2017 1152      I, Trixie Dredge, am acting as Location manager for Dennard Nip, MD I have reviewed the  above documentation for accuracy and completeness, and I agree with the above. -Dennard Nip, MD

## 2019-07-15 ENCOUNTER — Telehealth: Payer: Self-pay | Admitting: Neurology

## 2019-07-15 DIAGNOSIS — G4733 Obstructive sleep apnea (adult) (pediatric): Secondary | ICD-10-CM

## 2019-07-15 DIAGNOSIS — G4731 Primary central sleep apnea: Secondary | ICD-10-CM

## 2019-07-15 NOTE — Telephone Encounter (Signed)
Pt would like to know if RN can download her bpap machine infor to see if there are any changes that need to be made. Please advise.

## 2019-07-15 NOTE — Telephone Encounter (Signed)
I reviewed patient's BiPAP compliance data from 06/15/2019 through 07/14/2019, which is a total of 30 days, during which time the patient used her BiPAP machine every night with percent use days greater than 4 hours at 100%, indicating superb compliance, average usage of 7 hours and 25 minutes, residual AHI is elevated at 24.2/h, leak very low with a 95th percentile at 0.6 L/min on a pressure of 11/7, residual sleep disordered breathing is mostly obstructive. I recommend that her treatment pressure be increased to 13/9 cm. Please call patient to advise her and fax order to her DME company.

## 2019-07-16 DIAGNOSIS — I4891 Unspecified atrial fibrillation: Secondary | ICD-10-CM | POA: Diagnosis not present

## 2019-07-16 DIAGNOSIS — E669 Obesity, unspecified: Secondary | ICD-10-CM | POA: Diagnosis not present

## 2019-07-16 DIAGNOSIS — G35 Multiple sclerosis: Secondary | ICD-10-CM | POA: Diagnosis not present

## 2019-07-16 DIAGNOSIS — I872 Venous insufficiency (chronic) (peripheral): Secondary | ICD-10-CM | POA: Diagnosis not present

## 2019-07-16 DIAGNOSIS — E785 Hyperlipidemia, unspecified: Secondary | ICD-10-CM | POA: Diagnosis not present

## 2019-07-16 DIAGNOSIS — I10 Essential (primary) hypertension: Secondary | ICD-10-CM | POA: Diagnosis not present

## 2019-07-16 NOTE — Telephone Encounter (Signed)
Pt returned my call. I discussed these recommendations with her. She will keep her follow up with Jinny Blossom, NP in December as well and will expect a call from [facility] with the pressure change update. Pt verbalized understanding of recommendations. Pt had no questions at this time but was encouraged to call back if questions arise.

## 2019-07-16 NOTE — Telephone Encounter (Signed)
I called pt to discuss. No answer, left a message asking her to call me back. 

## 2019-07-22 DIAGNOSIS — I4891 Unspecified atrial fibrillation: Secondary | ICD-10-CM | POA: Diagnosis not present

## 2019-07-22 DIAGNOSIS — G35 Multiple sclerosis: Secondary | ICD-10-CM | POA: Diagnosis not present

## 2019-07-22 DIAGNOSIS — E669 Obesity, unspecified: Secondary | ICD-10-CM | POA: Diagnosis not present

## 2019-07-22 DIAGNOSIS — I10 Essential (primary) hypertension: Secondary | ICD-10-CM | POA: Diagnosis not present

## 2019-07-22 DIAGNOSIS — E785 Hyperlipidemia, unspecified: Secondary | ICD-10-CM | POA: Diagnosis not present

## 2019-07-22 DIAGNOSIS — I872 Venous insufficiency (chronic) (peripheral): Secondary | ICD-10-CM | POA: Diagnosis not present

## 2019-07-23 ENCOUNTER — Ambulatory Visit: Payer: Medicare Other | Admitting: Orthopaedic Surgery

## 2019-07-24 DIAGNOSIS — E785 Hyperlipidemia, unspecified: Secondary | ICD-10-CM | POA: Diagnosis not present

## 2019-07-24 DIAGNOSIS — I10 Essential (primary) hypertension: Secondary | ICD-10-CM | POA: Diagnosis not present

## 2019-07-24 DIAGNOSIS — I4891 Unspecified atrial fibrillation: Secondary | ICD-10-CM | POA: Diagnosis not present

## 2019-07-24 DIAGNOSIS — I872 Venous insufficiency (chronic) (peripheral): Secondary | ICD-10-CM | POA: Diagnosis not present

## 2019-07-24 DIAGNOSIS — G35 Multiple sclerosis: Secondary | ICD-10-CM | POA: Diagnosis not present

## 2019-07-24 DIAGNOSIS — E669 Obesity, unspecified: Secondary | ICD-10-CM | POA: Diagnosis not present

## 2019-07-28 ENCOUNTER — Ambulatory Visit (INDEPENDENT_AMBULATORY_CARE_PROVIDER_SITE_OTHER): Payer: Medicare Other | Admitting: Pharmacist

## 2019-07-28 ENCOUNTER — Ambulatory Visit: Payer: Medicare Other | Admitting: Orthopaedic Surgery

## 2019-07-28 ENCOUNTER — Other Ambulatory Visit: Payer: Self-pay

## 2019-07-28 DIAGNOSIS — I4821 Permanent atrial fibrillation: Secondary | ICD-10-CM

## 2019-07-28 DIAGNOSIS — Z7901 Long term (current) use of anticoagulants: Secondary | ICD-10-CM

## 2019-07-28 LAB — POCT INR: INR: 3.4 — AB (ref 2.0–3.0)

## 2019-07-29 DIAGNOSIS — G35 Multiple sclerosis: Secondary | ICD-10-CM | POA: Diagnosis not present

## 2019-07-29 DIAGNOSIS — I4891 Unspecified atrial fibrillation: Secondary | ICD-10-CM | POA: Diagnosis not present

## 2019-07-29 DIAGNOSIS — E669 Obesity, unspecified: Secondary | ICD-10-CM | POA: Diagnosis not present

## 2019-07-29 DIAGNOSIS — E785 Hyperlipidemia, unspecified: Secondary | ICD-10-CM | POA: Diagnosis not present

## 2019-07-29 DIAGNOSIS — I10 Essential (primary) hypertension: Secondary | ICD-10-CM | POA: Diagnosis not present

## 2019-07-29 DIAGNOSIS — I872 Venous insufficiency (chronic) (peripheral): Secondary | ICD-10-CM | POA: Diagnosis not present

## 2019-07-30 ENCOUNTER — Encounter (INDEPENDENT_AMBULATORY_CARE_PROVIDER_SITE_OTHER): Payer: Self-pay | Admitting: Family Medicine

## 2019-07-30 ENCOUNTER — Other Ambulatory Visit: Payer: Self-pay

## 2019-07-30 ENCOUNTER — Telehealth (INDEPENDENT_AMBULATORY_CARE_PROVIDER_SITE_OTHER): Payer: Medicare Other | Admitting: Family Medicine

## 2019-07-30 DIAGNOSIS — Z6833 Body mass index (BMI) 33.0-33.9, adult: Secondary | ICD-10-CM

## 2019-07-30 DIAGNOSIS — E669 Obesity, unspecified: Secondary | ICD-10-CM | POA: Diagnosis not present

## 2019-07-30 DIAGNOSIS — F3289 Other specified depressive episodes: Secondary | ICD-10-CM

## 2019-07-30 NOTE — Progress Notes (Signed)
Office: [phone]  /  Fax: [phone] TeleHealth Visit:  Toni [name] has verbally consented to this TeleHealth visit today. The patient is located at home, the provider is located at the [employer] [facility] office. The participants in this visit include the listed provider and patient. The visit was conducted today via telephone call.  HPI:   Chief Complaint: OBESITY Toni [name] is here to discuss her progress with her obesity treatment plan. She is on the Category 1 plan and is following her eating plan approximately 80% of the time. She states she is doing physical therapy, home exercises 30-60 minutes 4-5 times per week. Toni [name] continues to work on [employer] loss and feels she has lost another 1-2 lbs, but is not sure as weighing herself at home is difficult with her MS. She is still recovering from her last flare and is doing PT and home exercises. Hunger is controlled. We were unable to weigh the patient today for this TeleHealth visit. She feels as if she has lost weight since her last visit. She has lost 61 lbs since starting treatment with [name].  Depression with emotional eating behaviors Toni [name] is struggling with emotional eating and using food for comfort to the extent that it is negatively impacting her health. She often snacks when she is not hungry. Toni [name] sometimes feels she is out of control and then feels guilty that she made poor food choices. She has been working on behavior modification techniques to help reduce her emotional eating and has been somewhat successful. Toni [name]'s mood appears stable on her medications. She, of course, is unhappy with her slow progress recovery from MS flare and realizes she may not go back to her previous level of health, but she is trying to keep her spirits up. She has done very well minimizing emotional eating. She shows no sign of suicidal or homicidal ideations.  Depression screen [facility] 2/9 08/06/2018 07/09/2018 06/25/2018  06/04/2018 05/13/2018  Decreased Interest 0 0 0 0 0  Down, Depressed, Hopeless 0 0 0 0 0  PHQ - 2 Score 0 0 0 0 0  Altered sleeping 0 1 3 0 1  Tired, decreased energy 1 1 2 1 1   Change in appetite 0 0 0 0 0  Feeling bad or failure about yourself  0 0 1 1 0  Trouble concentrating 0 1 0 0 0  Moving slowly or fidgety/restless 1 0 1 1 0  Suicidal thoughts 0 0 0 0 0  PHQ-9 Score 2 3 7 3 2   Some recent data might be hidden   ASSESSMENT AND PLAN:  Other depression, emotional eating  Class 1 obesity with serious comorbidity and body mass index (BMI) of 33.0 to 33.9 in adult, unspecified obesity type  PLAN:  Emotional Eating Behaviors (other depression) Behavior modification techniques were discussed today to help Toni [name] deal with her emotional/non-hunger eating behaviors. [name] will continue Wellbutrin as is and will continue to monitor encourage her.  I spent > than 50% of the 20 minute visit on counseling as documented in the note.  TIME SPENT: 22 minutes   Obesity Toni [name] is currently in the action stage of change. As such, her goal is to continue with weight loss efforts. She has agreed to follow the Category 1 plan. Toni [name] has been instructed to work up to a goal of 150 minutes of combined cardio and strengthening exercise per week for weight loss and overall health benefits. We discussed the following Behavioral Modification Strategies today: increasing vegetables,  work on [employer] and easy cooking plans, and emotional eating strategies.  Toni [name] has agreed to follow-up with our clinic in 2 weeks. She was informed of the importance of frequent follow-up visits to maximize her success with intensive lifestyle modifications for her multiple health conditions.  ALLERGIES: Allergies  Allergen Reactions  . Hydrocodone Nausea And Vomiting    Hydrocodone causes vomiting  . Oxycodone Nausea And Vomiting    Oral oxycodone causes vomiting  . Chlorhexidine Gluconate  Rash    Sores at site  . Zoloft [Sertraline] Hives, Swelling and Rash    MEDICATIONS: Current Outpatient Medications on File Prior to Visit  Medication Sig Dispense Refill  . acetaminophen (TYLENOL) 500 MG tablet Take 500-1,000 mg by mouth every 6 (six) hours as needed for fever or headache (or pain).    Marland Kitchen ALPRAZolam (XANAX) 0.5 MG tablet TAKE 1 TABLET BY MOUTH AT  BEDTIME AS NEEDED FOR  ANXIETY OR SLEEP 30 tablet 0  . baclofen (LIORESAL) 20 MG tablet Once to two tablets a day for breakthrough spasms 180 tablet 0  . Biotin (PA BIOTIN) 1000 MCG tablet Take 1,000 mcg by mouth daily.    Marland Kitchen buPROPion (WELLBUTRIN XL) 300 MG 24 hr tablet Take 1 tablet (300 mg total) by mouth daily. 30 tablet 0  . CALCIUM CITRATE PO Take 2 tablets by mouth 2 (two) times daily.    . cetirizine (ZYRTEC) 10 MG tablet Take 10 mg by mouth daily.    . Cholecalciferol (VITAMIN D) 2000 units tablet Take 1 tablet (2,000 Units total) by mouth 2 (two) times daily.  0  . co-enzyme Q-10 30 MG capsule Take 30 mg by mouth daily.    . corticotropin (ACTHAR) 80 UNIT/ML injectable gel INJECT 80 UNITS (1ML) DAILY FOR 10 DAYS. DUE TO MS EXACERBATION AND THEN CAN RESUME NEXT MONTH AT PREVIOUS DOSE. 10 mL 1  . Cranberry 1000 MG CAPS Take by mouth.    . diltiazem (CARDIZEM CD) 180 MG 24 hr capsule TAKE 1 CAPSULE BY MOUTH  DAILY 90 capsule 3  . docusate sodium (COLACE) 100 MG capsule Take 1 capsule (100 mg total) by mouth daily. 10 capsule 0  . famotidine (PEPCID) 20 MG tablet TK 1 T PO D  6  . fluticasone (FLONASE) 50 MCG/ACT nasal spray Place 2 sprays into both nostrils daily as needed for allergies.     . furosemide (LASIX) 40 MG tablet Take 80 mg by mouth daily.     Marland Kitchen gabapentin (NEURONTIN) 300 MG capsule TAKE 1 CAPSULE BY MOUTH UP  TO 4 TIMES DAILY 360 capsule 3  . GENERLAC 10 GM/15ML SOLN TAKE 15 ML BY MOUTH TWICE DAILY AS NEEDED 946 mL 1  . levothyroxine (SYNTHROID, LEVOTHROID) 75 MCG tablet Take 75 mcg by mouth daily.    Marland Kitchen  linaclotide (LINZESS) 145 MCG CAPS capsule Take 1 capsule (145 mcg total) by mouth daily before breakfast. 90 capsule 3  . Multiple Vitamins-Minerals (CENTRUM SILVER PO) Take 1 tablet by mouth daily.    . nebivolol (BYSTOLIC) 10 MG tablet Take 10 mg by mouth daily. Take 10mg  daily unless bp systolic is below [id number] then take 5mg     . Omega-3 Fatty Acids (FISH OIL) 1000 MG CAPS Take 1,000 mg by mouth daily.    Marland Kitchen omeprazole (PRILOSEC) 20 MG capsule Take 20 mg by mouth daily.     . psyllium (METAMUCIL SMOOTH TEXTURE) 28 % packet Take 1 packet by mouth every other day.    Marland Kitchen  simvastatin (ZOCOR) 10 MG tablet     . SYRINGE/NEEDLE, DISP, 1 ML (BD LUER-LOK SYRINGE) 25G X 5/8" 1 ML MISC Use as directed to inject  Acthar 10 each prn  . Tamsulosin HCl (FLOMAX) 0.4 MG CAPS Take 0.4 mg by mouth daily.    Marland Kitchen UNABLE TO FIND Baclofen pump: Continuous    . warfarin (COUMADIN) 5 MG tablet TAKE 1 TABLET BY MOUTH  DAILY AS DIRECTED BY  COUMADIN CLINIC 90 tablet 1   Current Facility-Administered Medications on File Prior to Visit  Medication Dose Route Frequency Provider Last Rate Last Dose  . baclofen (LIORESAL) intrathecal injection 40 mg/63mL  40 mg Intrathecal Once Kathrynn Ducking, MD        PAST MEDICAL HISTORY: Past Medical History:  Diagnosis Date  . Abnormality of gait 03/01/2015  . Anxiety   . Arthritis    "knees" (01/19/2016)  . Atrial fibrillation with RVR ([place]) 01/19/2016  . Back pain   . Chest pain   . Childhood asthma   . Chronic pain    "nerve pain from the MS"  . Constipation   . Depression   . DVT (deep venous thrombosis) ([place]) 1983   "RLE; may have just been phlebitis; it was before the age of dopplers"  . 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 GERD (gastroesophageal reflux disease)   . History of stress test 06/21/10   limited exam with some degree of breast attenuatin of the apex however a component of apical ischemia is not  excluded, EF 62%; cardiac cath   . HLD (hyperlipidemia)   . Hx of echocardiogram 04/22/10   EF 55-60%, no valve issues  . Hypertension   . Hypothyroidism   . Joint pain   . Migraine    "none since I stopped beta blocker in 1990s" (01/19/2016)  . Multiple sclerosis ([place])    has had this may 1989-[place] reg doc  . OSA on CPAP   . Osteoarthritis   . PAF (paroxysmal atrial fibrillation) ([place])    on coumadin; documented on monitor 06/2010  . Pneumonia 11/2011  . Shortness of breath   . Thyroid disease     PAST SURGICAL HISTORY: Past Surgical History:  Procedure Laterality Date  . ANTERIOR CERVICAL DECOMP/DISCECTOMY FUSION  01/2004  . BACK SURGERY    . CARDIAC CATHETERIZATION  06/22/2010   normal coronary arteries, PAF  . CATARACT EXTRACTION, BILATERAL Bilateral   . CHILD BIRTHS     X2  . DILATION AND CURETTAGE OF UTERUS    . INFUSION PUMP IMPLANTATION  ~ 2009   baclofen infusion in lower abd  . PAIN PUMP REVISION N/A 07/29/2014   Procedure: Baclofen pump replacement;  Surgeon: Erline Levine, MD;  Location: [facility];  Service: Neurosurgery;  Laterality: N/A;  Baclofen pump replacement  . TONSILLECTOMY AND ADENOIDECTOMY    . VARICOSE VEIN SURGERY  ~ 1968  . VENOUS ABLATION  12/16/2010   radiofreq ablation -Dr Elisabeth [facility]  . WISDOM TOOTH EXTRACTION      SOCIAL HISTORY: Social History   Tobacco Use  . Smoking status: Former Smoker    Packs/day: 0.50    Years: 7.00    Pack years: 3.50    Types: Cigarettes    Quit date: 08/21/1976    Years since quitting: 42.9  . Smokeless tobacco: Never Used  Substance Use Topics  . Alcohol use: Yes  Alcohol/week: 1.0 standard drinks    Types: 1 Glasses of wine per week  . Drug use: No    FAMILY HISTORY: Family History  Problem Relation Age of Onset  . Coronary artery disease Father        at age 26  . Hyperlipidemia Father   . Thyroid disease Father   . Coronary artery disease Maternal Grandmother   . Depression Maternal  Grandmother   . Cancer Paternal Grandmother   . Depression Mother   . Sudden death Mother   . Anxiety disorder Mother   . Alcoholism Son    ROS: Review of Systems  Psychiatric/Behavioral: Positive for depression. Negative for suicidal ideas.       Negative for homicidal ideas.   PHYSICAL EXAM: Pt in no acute distress  RECENT LABS AND TESTS: BMET    Component Value Date/Time   NA 145 (H) 03/19/2019 1517   K 4.2 03/19/2019 1517   CL 105 03/19/2019 1517   CO2 25 03/19/2019 1517   GLUCOSE 78 03/19/2019 1517   GLUCOSE 92 07/08/2017 1214   BUN 24 03/19/2019 1517   CREATININE 0.64 03/19/2019 1517   CALCIUM 9.0 03/19/2019 1517   GFRNONAA 90 03/19/2019 1517   GFRAA 104 03/19/2019 1517   Lab Results  Component Value Date   HGBA1C 5.0 03/19/2019   HGBA1C 5.3 09/11/2018   HGBA1C 5.3 04/23/2018   HGBA1C 5.2 10/01/2017   HGBA1C 5.4 03/08/2017   Lab Results  Component Value Date   INSULIN 7.1 03/19/2019   INSULIN 13.0 09/11/2018   INSULIN 7.8 04/23/2018   INSULIN 20.4 11/27/2017   INSULIN 20.4 03/08/2017   CBC    Component Value Date/Time   WBC 8.9 07/11/2018   WBC 8.4 07/08/2017 1214   RBC 4.83 07/08/2017 1214   HGB 14.6 07/11/2018   HGB 14.1 03/08/2017 1152   HCT 45 07/11/2018   HCT 43.0 03/08/2017 1152   PLT 185 10/01/2017   PLT 219 12/18/2014 1157   MCV 93.2 07/08/2017 1214   MCV 93 03/08/2017 1152   MCH 31.5 07/08/2017 1214   MCHC 33.8 07/08/2017 1214   RDW 13.3 07/08/2017 1214   RDW 13.3 03/08/2017 1152   LYMPHSABS 1.6 07/08/2017 1214   LYMPHSABS 2.2 03/08/2017 1152   MONOABS 1.3 (H) 07/08/2017 1214   EOSABS 0.2 07/08/2017 1214   EOSABS 0.1 03/08/2017 1152   BASOSABS 0.0 07/08/2017 1214   BASOSABS 0.0 03/08/2017 1152   Iron/TIBC/Ferritin/ %Sat No results found for: IRON, TIBC, FERRITIN, IRONPCTSAT Lipid Panel     Component Value Date/Time   CHOL 111 03/19/2019 1517   TRIG 53 03/19/2019 1517   HDL 57 03/19/2019 1517   CHOLHDL 3.0 04/22/2010 0536    VLDL 29 04/22/2010 0536   LDLCALC 43 03/19/2019 1517   Hepatic Function Panel     Component Value Date/Time   PROT 5.5 (L) 03/19/2019 1517   ALBUMIN 3.7 03/19/2019 1517   AST 12 03/19/2019 1517   ALT 22 03/19/2019 1517   ALKPHOS 48 03/19/2019 1517   BILITOT 0.2 03/19/2019 1517      Component Value Date/Time   TSH 2.060 03/19/2019 1517   TSH 0.651 11/27/2017 1011   TSH 2.410 03/08/2017 1152   Results for Koffman, Kyndel G "[name]" (MRN [id number]) as of 07/30/2019 14:57  Ref. Range 03/19/2019 15:17  Vitamin D, 25-Hydroxy Latest Ref Range: 30.0 - 100.0 ng/mL 67.7   I, Michaelene Song, am acting as Location manager for Dennard Nip, MD I have reviewed  the above documentation for accuracy and completeness, and I agree with the above. -Dennard Nip, MD

## 2019-07-31 ENCOUNTER — Other Ambulatory Visit: Payer: Self-pay

## 2019-07-31 ENCOUNTER — Encounter (HOSPITAL_COMMUNITY): Payer: Self-pay

## 2019-07-31 ENCOUNTER — Emergency Department (HOSPITAL_COMMUNITY)
Admission: EM | Admit: 2019-07-31 | Discharge: 2019-08-01 | Disposition: A | Discharge status: Home / Self Care | Payer: Medicare Other | Attending: Emergency Medicine | Admitting: Emergency Medicine

## 2019-07-31 ENCOUNTER — Emergency Department (HOSPITAL_COMMUNITY): Payer: Medicare Other

## 2019-07-31 DIAGNOSIS — G35 Multiple sclerosis: Secondary | ICD-10-CM | POA: Diagnosis not present

## 2019-07-31 DIAGNOSIS — Y929 Unspecified place or not applicable: Secondary | ICD-10-CM | POA: Insufficient documentation

## 2019-07-31 DIAGNOSIS — S3992XA Unspecified injury of lower back, initial encounter: Secondary | ICD-10-CM | POA: Diagnosis present

## 2019-07-31 DIAGNOSIS — I10 Essential (primary) hypertension: Secondary | ICD-10-CM | POA: Insufficient documentation

## 2019-07-31 DIAGNOSIS — G8929 Other chronic pain: Secondary | ICD-10-CM | POA: Insufficient documentation

## 2019-07-31 DIAGNOSIS — Z87891 Personal history of nicotine dependence: Secondary | ICD-10-CM | POA: Diagnosis not present

## 2019-07-31 DIAGNOSIS — S322XXA Fracture of coccyx, initial encounter for closed fracture: Secondary | ICD-10-CM | POA: Insufficient documentation

## 2019-07-31 DIAGNOSIS — M25512 Pain in left shoulder: Secondary | ICD-10-CM | POA: Insufficient documentation

## 2019-07-31 DIAGNOSIS — K5900 Constipation, unspecified: Secondary | ICD-10-CM | POA: Diagnosis not present

## 2019-07-31 DIAGNOSIS — E039 Hypothyroidism, unspecified: Secondary | ICD-10-CM | POA: Insufficient documentation

## 2019-07-31 DIAGNOSIS — W1789XA Other fall from one level to another, initial encounter: Secondary | ICD-10-CM | POA: Diagnosis not present

## 2019-07-31 DIAGNOSIS — Z79899 Other long term (current) drug therapy: Secondary | ICD-10-CM | POA: Diagnosis not present

## 2019-07-31 DIAGNOSIS — Y939 Activity, unspecified: Secondary | ICD-10-CM | POA: Insufficient documentation

## 2019-07-31 DIAGNOSIS — Y999 Unspecified external cause status: Secondary | ICD-10-CM | POA: Diagnosis not present

## 2019-07-31 DIAGNOSIS — E119 Type 2 diabetes mellitus without complications: Secondary | ICD-10-CM | POA: Diagnosis not present

## 2019-07-31 MED ORDER — HYDROMORPHONE HCL 1 MG/ML IJ SOLN
0.5000 mg | Freq: Once | INTRAMUSCULAR | Status: AC
  Administered 2019-07-31: 0.5 mg via SUBCUTANEOUS
  Filled 2019-07-31: qty 1

## 2019-07-31 NOTE — ED Triage Notes (Addendum)
BIB EMS from home. Pt fell and slide to ground 1 weeks ago and had initial pain.  Pt now reports pain in rectum when trying to have a bowel movement. Pt also reports she feels like there is tissue hanging out of rectum pt was unable to have bowl movement.  Last bowl movement was 2 days .Pt has a history of MS.    162/84 84 16 98% 97.6

## 2019-07-31 NOTE — ED Notes (Signed)
ED Provider at bedside. 

## 2019-07-31 NOTE — ED Provider Notes (Signed)
[facility] DEPT Provider Note   CSN: [id number] Arrival date & time: 07/31/19  2124     History No chief complaint on file.   Toni [name] is a 73 y.o. female.  Patient with history of MS, PAF, HTN, HLD, depression, OSA, migraine presents for evaluation of right buttock pain over the last 2-3 days without known injury. She reports she had a minor fall 2 weeks ago where she slid from sitting to the floor but had only mild soreness for 2 days and no symptoms of pain since. She also reports concern for discomfort and swelling at her rectum. She has baseline generalized muscle weakness from her MS and sometimes has to self disimpact in order to have a bowel movement. She states tonight she felt something just inside the rectum that wasn't stool. No bleeding. She also reports her vagina also feels more swollen to her. No difficulty urinating. She has not lost continence of either bowel or bladder with her MS. No abdominal pain, fever, nausea.   The history is provided by the patient. No language interpreter was used.       Past Medical History:  Diagnosis Date  . Abnormality of gait 03/01/2015  . Anxiety   . Arthritis    "knees" (01/19/2016)  . Atrial fibrillation with RVR ([place]) 01/19/2016  . Back pain   . Chest pain   . Childhood asthma   . Chronic pain    "nerve pain from the MS"  . Constipation   . Depression   . DVT (deep venous thrombosis) ([place]) 1983   "RLE; may have just been phlebitis; it was before the age of dopplers"  . 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 GERD (gastroesophageal reflux disease)   . History of stress test 06/21/10   limited exam with some degree of breast attenuatin of the apex however a component of apical ischemia is not excluded, EF 62%; cardiac cath   . HLD (hyperlipidemia)   . Hx of echocardiogram 04/22/10   EF 55-60%, no valve issues  . Hypertension   .  Hypothyroidism   . Joint pain   . Migraine    "none since I stopped beta blocker in 1990s" (01/19/2016)  . Multiple sclerosis ([place])    has had this may 1989-D[facility]meir reg doc  . OSA on CPAP   . Osteoarthritis   . PAF (paroxysmal atrial fibrillation) ([place])    on coumadin; documented on monitor 06/2010  . Pneumonia 11/2011  . Shortness of breath   . Thyroid disease     Patient Active Problem List   Diagnosis Date Noted  . Unilateral primary osteoarthritis, left knee 01/09/2019  . Type 2 diabetes mellitus without complication, without long-term current use of insulin ([place]) 08/27/2018  . Obstructive sleep apnea treated with bilevel positive airway pressure (BiPAP) 08/15/2018  . Sleep apnea treated with nocturnal BiPAP 04/23/2018  . 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 ([place]) 11/21/2017  . Permanent atrial fibrillation ([place]) 11/21/2017  . Tightness of leg fascia 11/21/2017  . Muscle spasm of both lower legs 11/21/2017  . Carpal tunnel syndrome, left upper limb 09/13/2017  . Carpal tunnel syndrome, right upper limb 09/13/2017  . Other insomnia 06/11/2017  . Depression 06/11/2017  . Abdominal swelling 03/29/2017  . Central apnea 03/26/2017  . Class 3 obesity with serious comorbidity and body mass index (BMI) of 40.0 to 44.9 in  adult 03/22/2017  . Vitamin D deficiency 03/22/2017  . Prediabetes 03/22/2017  . Venous insufficiency 02/14/2017  . Right hand weakness 08/06/2016  . Nonischemic cardiomyopathy ([place]) 05/09/2016  . Localized edema 05/09/2016  . Long term current use of anticoagulant therapy 02/03/2016  . 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 Atrial fibrillation with RVR ([place]) 01/19/2016  . 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 Obesity hypoventilation syndrome ([place]) 03/30/2015  . Abnormality of gait 03/01/2015  . Chronic constipation with overflow incontinence 01/25/2015  . 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) ([place]) 01/25/2015  . 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 MS (multiple sclerosis) ([place]) 07/29/2014  . Closed right ankle fracture 10/30/2013  . Leucocytosis 10/30/2013  . Varicose veins of both lower extremities with complications 0000000  . OSA on CPAP 06/04/2013  . Muscle spasms of lower extremity 02/24/2013  . Long term (current) use of anticoagulants 11/05/2012  . 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 Aspiration pneumonia vs Failed outpatient Rx of CAP 11/26/2011  . Essential hypertension   . Thyroid disease   . HLD (hyperlipidemia)     Past Surgical History:  Procedure Laterality Date  . ANTERIOR CERVICAL DECOMP/DISCECTOMY FUSION  01/2004  . BACK SURGERY    . CARDIAC CATHETERIZATION  06/22/2010   normal coronary arteries, PAF  . CATARACT EXTRACTION, BILATERAL Bilateral   . CHILD BIRTHS     X2  . DILATION AND CURETTAGE OF UTERUS    . INFUSION PUMP IMPLANTATION  ~ 2009   baclofen infusion in lower abd  . PAIN PUMP REVISION N/A 07/29/2014   Procedure: Baclofen pump replacement;  Surgeon: Erline Levine, MD;  Location: [facility];  Service: Neurosurgery;  Laterality: N/A;  Baclofen pump replacement  . TONSILLECTOMY AND ADENOIDECTOMY    . VARICOSE VEIN SURGERY  ~ 1968  . VENOUS ABLATION  12/16/2010   radiofreq ablation -Dr Elisabeth Cara and [facility]  . WISDOM TOOTH EXTRACTION       OB History    Gravida  2   Para      Term      Preterm      AB      Living        SAB      TAB      Ectopic      Multiple      Live Births              Family History  Problem Relation Age of Onset  . Coronary artery disease Father        at age 68  . Hyperlipidemia Father   . Thyroid disease Father   . Coronary artery disease Maternal Grandmother   . Depression Maternal Grandmother   . Cancer Paternal Grandmother   .  Depression Mother   . Sudden death Mother   . Anxiety disorder Mother   . Alcoholism Son     Social History   Tobacco Use  . Smoking status: Former Smoker    Packs/day: 0.50    Years: 7.00    Pack years: 3.50    Types: Cigarettes    Quit date: 08/21/1976    Years since quitting: 42.9  . Smokeless tobacco: Never Used  Substance Use Topics  . Alcohol use: Yes    Alcohol/week: 1.0 standard drinks    Types: 1 Glasses of wine  per week  . Drug use: No    Home Medications Prior to Admission medications   Medication Sig Start Date End Date Taking? Authorizing Provider  acetaminophen (TYLENOL) 500 MG tablet Take 500-1,000 mg by mouth every 6 (six) hours as needed for fever or headache (or pain).   Yes [provider]  ALPRAZolam (XANAX) 0.5 MG tablet TAKE 1 TABLET BY MOUTH AT  BEDTIME AS NEEDED FOR  ANXIETY OR SLEEP Patient taking differently: Take 0.5 mg by mouth at bedtime as needed for anxiety.  05/05/19  Yes Ward Givens, NP  baclofen (LIORESAL) 20 MG tablet Once to two tablets a day for breakthrough spasms Patient taking differently: Take 20 mg by mouth 2 (two) times daily as needed for muscle spasms.  08/27/18  Yes Dohmeier, Asencion Partridge, MD  Biotin (PA BIOTIN) 1000 MCG tablet Take 1,000 mcg by mouth daily.   Yes [provider]  buPROPion (WELLBUTRIN XL) 300 MG 24 hr tablet Take 1 tablet (300 mg total) by mouth daily. 08/23/17  Yes Beasley, Caren D, MD  BYSTOLIC 5 MG tablet Take 5 mg by mouth daily.  03/26/19  Yes [provider]  CALCIUM CITRATE PO Take 2 tablets by mouth 2 (two) times daily.   Yes [provider]  cetirizine (ZYRTEC) 10 MG tablet Take 10 mg by mouth daily as needed for allergies.    Yes [provider]  Cholecalciferol (VITAMIN D) 2000 units tablet Take 1 tablet (2,000 Units total) by mouth 2 (two) times daily. 03/22/17  Yes Beasley, Caren D, MD  co-enzyme Q-10 30 MG capsule Take 30 mg by mouth daily.   Yes [provider]   corticotropin (ACTHAR) 80 UNIT/ML injectable gel INJECT 80 UNITS (1ML) DAILY FOR 10 DAYS. DUE TO MS EXACERBATION AND THEN CAN RESUME NEXT MONTH AT PREVIOUS DOSE. 06/18/19  Yes Dohmeier, Asencion Partridge, MD  Cranberry 1000 MG CAPS Take 2 capsules by mouth 2 (two) times daily.    Yes [provider]  diltiazem (CARDIZEM CD) 180 MG 24 hr capsule TAKE 1 CAPSULE BY MOUTH  DAILY 01/14/19  Yes Hilty, Nadean Corwin, MD  docusate sodium (COLACE) 100 MG capsule Take 1 capsule (100 mg total) by mouth daily. 08/12/16  Yes Rai, Ripudeep K, MD  famotidine (PEPCID) 20 MG tablet Take 20 mg by mouth at bedtime.  06/11/18  Yes [provider]  fluticasone (FLONASE) 50 MCG/ACT nasal spray Place 2 sprays into both nostrils daily as needed for allergies.  03/23/15  Yes [provider]  furosemide (LASIX) 40 MG tablet Take 20 mg by mouth 2 (two) times daily.    Yes [provider]  gabapentin (NEURONTIN) 300 MG capsule TAKE 1 CAPSULE BY MOUTH UP  TO 4 TIMES DAILY Patient taking differently: Take 300 mg by mouth 4 (four) times daily.  12/31/18  Yes Dohmeier, Asencion Partridge, MD  GENERLAC 10 GM/15ML SOLN TAKE 15 ML BY MOUTH TWICE DAILY AS NEEDED Patient taking differently: Take 30 g by mouth daily as needed (constipation).  06/25/19  Yes Dohmeier, Asencion Partridge, MD  levothyroxine (SYNTHROID, LEVOTHROID) 75 MCG tablet Take 75 mcg by mouth daily.   Yes [provider]  Multiple Vitamins-Minerals (CENTRUM SILVER PO) Take 1 tablet by mouth daily.   Yes [provider]  Omega-3 Fatty Acids (FISH OIL) 1000 MG CAPS Take 1,000 mg by mouth daily.   Yes [provider]  omeprazole (PRILOSEC) 20 MG capsule Take 20 mg by mouth daily.    Yes [provider]  psyllium (METAMUCIL SMOOTH TEXTURE) 28 % packet Take 1 packet by mouth daily.    Yes [provider]  simvastatin (ZOCOR) 10 MG tablet Take 10 mg by mouth every evening.  03/01/18  Yes [provider]  Tamsulosin HCl (FLOMAX)  0.4 MG CAPS Take 0.4 mg by mouth daily.   Yes [provider]  UNABLE TO FIND Baclofen pump: Continuous   Yes [provider]  warfarin (COUMADIN) 5 MG tablet TAKE 1 TABLET BY MOUTH  DAILY AS DIRECTED BY  COUMADIN CLINIC Patient taking differently: Take 2.5-5 mg by mouth as directed. Take 1 tablet (5 mg) daily except on Friday Take 1/2 tablet (2.5 mg) as directed by coumadin clinic. 10/30/18  Yes Hilty, Nadean Corwin, MD  linaclotide [facility]) 145 MCG CAPS capsule Take 1 capsule (145 mcg total) by mouth daily before breakfast. Patient not taking: Reported on 07/31/2019 04/11/19   Th[place]ton [place], MD  SYRINGE/NEEDLE, DISP, 1 ML (BD LUER-LOK SYRINGE) 25G X 5/8" 1 ML MISC Use as directed to inject  Acthar 06/15/15   Dohmeier, Asencion Partridge, MD    Allergies    Hydrocodone, Oxycodone, Chlorhexidine gluconate, and Zoloft [sertraline]  Review of Systems   Review of Systems  Constitutional: Negative for activity change, appetite change and fever.  HENT: Negative.   Respiratory: Negative.  Negative for cough and shortness of breath.   Cardiovascular: Negative.  Negative for chest pain.  Gastrointestinal: Positive for constipation. Negative for abdominal pain, blood in stool, nausea and vomiting.       See HPI.  Genitourinary:       See HPI.  Musculoskeletal:       She c/o pain in right shoulder, chronic but worse tonight. C/o right buttock pain.    Physical Exam Updated Vital Signs BP 122/70   Pulse 87   Temp 98.5 F (36.9 C) (Oral)   Resp 20   SpO2 98%   Physical Exam Vitals and nursing note reviewed.  Constitutional:      General: She is not in acute distress.    Appearance: She is well-developed.     Comments: Very pleasant, alert patient in NAD  HENT:     Head: Normocephalic.  Cardiovascular:     Rate and Rhythm: Normal rate and regular rhythm.  Pulmonary:     Effort: Pulmonary effort is normal.     Breath sounds: Normal breath sounds.  Abdominal:     General: Bowel  sounds are normal.     Palpations: Abdomen is soft.     Tenderness: There is no abdominal tenderness. There is no guarding or rebound.     Comments: Baclofen pump palpated in RLQ, nontender.  Genitourinary:    General: Normal vulva.     Comments: No inflamed external hemorrhoids. No rectal tenderness, mass, swelling. There is light brown, soft stool in the rectum. There is no vulvar swelling, bladder prolapse or tenderness on digital vaginal exam.  Musculoskeletal:        General: Normal range of motion.     Cervical back: Normal range of motion and neck supple.  Skin:    General: Skin is warm and dry.     Findings: No rash.  Neurological:     Mental Status: She is alert and oriented to person, place, and time.     ED Results / Procedures / Treatments   Labs (all labs ordered are listed, but only abnormal results are displayed) Labs Reviewed  URINE CULTURE  URINALYSIS, ROUTINE W REFLEX MICROSCOPIC  EKG None  Radiology No results found.  Procedures Procedures (including critical care time)  Medications Ordered in ED Medications  HYDROmorphone (DILAUDID) injection 0.5 mg (has no administration in time range)    ED Course  I have reviewed the triage vital signs and the nursing notes.  Pertinent labs & imaging results that were available during my care of the patient were reviewed by me and considered in my medical decision making (see chart for details).    MDM Rules/Calculators/A&P     CHA2DS2/VAS Stroke Risk Points  Current as of 5 minutes ago     5 >= 2 Points: High Risk  1 - 1.99 Points: Medium Risk  0 Points: Low Risk    This is the only CHA2DS2/VAS Stroke Risk Points available for the past  year.: Last Change: N/A     Details    This score determines the patient's risk of having a stroke if the  patient has atrial fibrillation.       Points Metrics  1 Has Congestive Heart Failure:  Yes    Current as of 5 minutes ago  0 Has Vascular Disease:  No     Current as of 5 minutes ago  1 Has Hypertension:  Yes    Current as of 5 minutes ago  1 Age:  4    Current as of 5 minutes ago  1 Has Diabetes:  Yes    Current as of 5 minutes ago  0 Had Stroke:  No  Had TIA:  No  Had thromboembolism:  No    Current as of 5 minutes ago  1 Female:  Yes    Current as of 5 minutes ago                         Patient with history of MS, generalized muscle weakness, here with complaints as outline in HPI.   Patient had a recent minimal impact fall 2 weeks ago but no discomfort in the buttock until 2 days ago. On coumadin, but no palpable hematoma, induration or discoloration of the buttock. She has a moderate amount of stool present in the rectum. I disimpacted what was reachable. Will consider enema to help clear the remaining stool. No vaginal abnormalities.   Will check UA, provide enema. Imaging ordered to evaluate pelvis and coccyx. She is requesting pain control, mostly for the shoulder which seems to be bothering her more tonight. IM Dilaudid provided as she has intolerance to most oral opioid pain relievers.  Pain improved with IM medication. UA negative for infection. VSS. Enema successful, producing large bowel movement. Imaging shows cortical irregularity at sacrococcygeal junction. Though there is limitation of the study due to body habitus this area is suspicious for a nondisplaced fracture. Feel this correlates clinically given the patient's going pain.   She can be discharged home. She is encouraged to call her neurologist for outpatient follow up. Will provide Norco for pain. She has medication for nausea at home, which has been her reaction with Norco in the past. She is comfortable with this plan. Husband at bedside for transport home.  Final Clinical Impression(s) / ED Diagnoses Final diagnoses:  None   1. Constipation 2. History of MS 3. Nondisplaced fracture of sacrococcygeal junction 4. Chronic left shoulder pain  Rx / DC  Orders ED Discharge Orders    None       Dennie Bible 08/01/19 0115    Lacretia Leigh, MD  08/04/19 1721  

## 2019-07-31 NOTE — ED Notes (Addendum)
Patient transported to x-ray. ?

## 2019-07-31 NOTE — ED Notes (Signed)
Pure wick in place 

## 2019-08-01 DIAGNOSIS — S322XXA Fracture of coccyx, initial encounter for closed fracture: Secondary | ICD-10-CM | POA: Diagnosis not present

## 2019-08-01 LAB — URINALYSIS, ROUTINE W REFLEX MICROSCOPIC
Bilirubin Urine: NEGATIVE
Glucose, UA: NEGATIVE mg/dL
Hgb urine dipstick: NEGATIVE
Ketones, ur: NEGATIVE mg/dL
Nitrite: NEGATIVE
Protein, ur: NEGATIVE mg/dL
Specific Gravity, Urine: 1.006 (ref 1.005–1.030)
pH: 8 (ref 5.0–8.0)

## 2019-08-01 MED ORDER — ACETAMINOPHEN 500 MG PO TABS
1000.0000 mg | ORAL_TABLET | Freq: Once | ORAL | Status: AC
  Administered 2019-08-01: 01:00:00 1000 mg via ORAL
  Filled 2019-08-01: qty 2

## 2019-08-01 MED ORDER — HYDROCODONE-ACETAMINOPHEN 5-325 MG PO TABS: 1.0000 | ORAL_TABLET | Freq: Four times a day (QID) | ORAL | 0 refills | Status: DC | PRN

## 2019-08-01 MED ORDER — HYDROMORPHONE HCL 1 MG/ML IJ SOLN
0.5000 mg | Freq: Once | INTRAMUSCULAR | Status: AC
  Administered 2019-08-01: 0.5 mg via SUBCUTANEOUS
  Filled 2019-08-01: qty 1

## 2019-08-01 NOTE — ED Notes (Signed)
Pt tolerated 2 attempts at soap sud enema. After second attempt pt able to have medium amount of soft formed light brown stool.

## 2019-08-01 NOTE — ED Notes (Signed)
Called lab to verify they received urine sample. Lab informed they are running it now.

## 2019-08-01 NOTE — Discharge Instructions (Signed)
Call your neurologist for further outpatient management. Return to the ED with any new or concerning symptoms. Continue your medications as prescribed.   Take Norco for pain. Recommend taking after a meal, and use your nausea medicine to control any symptoms.

## 2019-08-03 ENCOUNTER — Inpatient Hospital Stay (HOSPITAL_COMMUNITY)
Admission: EM | Admit: 2019-08-03 | Discharge: 2019-08-08 | DRG: 690 | Disposition: A | Discharge status: Skilled Nursing Facility | Payer: Medicare Other | Attending: Internal Medicine | Admitting: Internal Medicine

## 2019-08-03 ENCOUNTER — Other Ambulatory Visit: Payer: Self-pay

## 2019-08-03 DIAGNOSIS — N39490 Overflow incontinence: Secondary | ICD-10-CM | POA: Diagnosis present

## 2019-08-03 DIAGNOSIS — K5900 Constipation, unspecified: Secondary | ICD-10-CM

## 2019-08-03 DIAGNOSIS — W19XXXA Unspecified fall, initial encounter: Secondary | ICD-10-CM | POA: Diagnosis present

## 2019-08-03 DIAGNOSIS — R29898 Other symptoms and signs involving the musculoskeletal system: Secondary | ICD-10-CM | POA: Diagnosis present

## 2019-08-03 DIAGNOSIS — I10 Essential (primary) hypertension: Secondary | ICD-10-CM | POA: Diagnosis present

## 2019-08-03 DIAGNOSIS — Z981 Arthrodesis status: Secondary | ICD-10-CM

## 2019-08-03 DIAGNOSIS — Z8249 Family history of ischemic heart disease and other diseases of the circulatory system: Secondary | ICD-10-CM

## 2019-08-03 DIAGNOSIS — I4821 Permanent atrial fibrillation: Secondary | ICD-10-CM | POA: Diagnosis present

## 2019-08-03 DIAGNOSIS — Z03818 Encounter for observation for suspected exposure to other biological agents ruled out: Secondary | ICD-10-CM | POA: Diagnosis not present

## 2019-08-03 DIAGNOSIS — F329 Major depressive disorder, single episode, unspecified: Secondary | ICD-10-CM | POA: Diagnosis present

## 2019-08-03 DIAGNOSIS — Z20828 Contact with and (suspected) exposure to other viral communicable diseases: Secondary | ICD-10-CM | POA: Diagnosis not present

## 2019-08-03 DIAGNOSIS — K219 Gastro-esophageal reflux disease without esophagitis: Secondary | ICD-10-CM | POA: Diagnosis present

## 2019-08-03 DIAGNOSIS — Z993 Dependence on wheelchair: Secondary | ICD-10-CM

## 2019-08-03 DIAGNOSIS — Z86718 Personal history of other venous thrombosis and embolism: Secondary | ICD-10-CM

## 2019-08-03 DIAGNOSIS — N390 Urinary tract infection, site not specified: Secondary | ICD-10-CM | POA: Diagnosis not present

## 2019-08-03 DIAGNOSIS — E079 Disorder of thyroid, unspecified: Secondary | ICD-10-CM | POA: Diagnosis present

## 2019-08-03 DIAGNOSIS — Z7901 Long term (current) use of anticoagulants: Secondary | ICD-10-CM

## 2019-08-03 DIAGNOSIS — G4730 Sleep apnea, unspecified: Secondary | ICD-10-CM | POA: Diagnosis present

## 2019-08-03 DIAGNOSIS — Z87891 Personal history of nicotine dependence: Secondary | ICD-10-CM

## 2019-08-03 DIAGNOSIS — F419 Anxiety disorder, unspecified: Secondary | ICD-10-CM | POA: Diagnosis present

## 2019-08-03 DIAGNOSIS — G35 Multiple sclerosis: Secondary | ICD-10-CM | POA: Diagnosis present

## 2019-08-03 DIAGNOSIS — E039 Hypothyroidism, unspecified: Secondary | ICD-10-CM | POA: Diagnosis present

## 2019-08-03 DIAGNOSIS — Z9689 Presence of other specified functional implants: Secondary | ICD-10-CM | POA: Diagnosis present

## 2019-08-03 DIAGNOSIS — E785 Hyperlipidemia, unspecified: Secondary | ICD-10-CM | POA: Diagnosis present

## 2019-08-03 DIAGNOSIS — E119 Type 2 diabetes mellitus without complications: Secondary | ICD-10-CM

## 2019-08-03 DIAGNOSIS — R531 Weakness: Secondary | ICD-10-CM

## 2019-08-03 DIAGNOSIS — K5909 Other constipation: Secondary | ICD-10-CM | POA: Diagnosis present

## 2019-08-03 DIAGNOSIS — Z1623 Resistance to quinolones and fluoroquinolones: Secondary | ICD-10-CM | POA: Diagnosis present

## 2019-08-03 DIAGNOSIS — B957 Other staphylococcus as the cause of diseases classified elsewhere: Secondary | ICD-10-CM | POA: Diagnosis present

## 2019-08-03 DIAGNOSIS — G4733 Obstructive sleep apnea (adult) (pediatric): Secondary | ICD-10-CM

## 2019-08-03 DIAGNOSIS — S3219XA Other fracture of sacrum, initial encounter for closed fracture: Secondary | ICD-10-CM | POA: Diagnosis not present

## 2019-08-03 DIAGNOSIS — S3210XA Unspecified fracture of sacrum, initial encounter for closed fracture: Secondary | ICD-10-CM

## 2019-08-03 DIAGNOSIS — Z79899 Other long term (current) drug therapy: Secondary | ICD-10-CM

## 2019-08-03 DIAGNOSIS — Z8349 Family history of other endocrine, nutritional and metabolic diseases: Secondary | ICD-10-CM

## 2019-08-03 LAB — URINE CULTURE: Culture: >=100000 — AB

## 2019-08-03 NOTE — ED Triage Notes (Signed)
Arrived by [facility] from home. C/O increased weakness in both legs. Hx of MS. Patient denies weakness anywhere else; no other neurological deficits.

## 2019-08-04 ENCOUNTER — Encounter (HOSPITAL_COMMUNITY): Payer: Self-pay

## 2019-08-04 ENCOUNTER — Observation Stay (HOSPITAL_COMMUNITY): Payer: Medicare Other

## 2019-08-04 ENCOUNTER — Emergency Department (HOSPITAL_COMMUNITY): Payer: Medicare Other

## 2019-08-04 ENCOUNTER — Telehealth: Payer: Self-pay | Admitting: Emergency Medicine

## 2019-08-04 DIAGNOSIS — G4733 Obstructive sleep apnea (adult) (pediatric): Secondary | ICD-10-CM

## 2019-08-04 DIAGNOSIS — E119 Type 2 diabetes mellitus without complications: Secondary | ICD-10-CM

## 2019-08-04 DIAGNOSIS — I10 Essential (primary) hypertension: Secondary | ICD-10-CM

## 2019-08-04 DIAGNOSIS — R531 Weakness: Secondary | ICD-10-CM

## 2019-08-04 DIAGNOSIS — K5900 Constipation, unspecified: Secondary | ICD-10-CM

## 2019-08-04 DIAGNOSIS — S3210XA Unspecified fracture of sacrum, initial encounter for closed fracture: Secondary | ICD-10-CM

## 2019-08-04 DIAGNOSIS — G35 Multiple sclerosis: Secondary | ICD-10-CM

## 2019-08-04 DIAGNOSIS — K5909 Other constipation: Secondary | ICD-10-CM

## 2019-08-04 DIAGNOSIS — R29898 Other symptoms and signs involving the musculoskeletal system: Secondary | ICD-10-CM | POA: Diagnosis not present

## 2019-08-04 DIAGNOSIS — N390 Urinary tract infection, site not specified: Secondary | ICD-10-CM | POA: Diagnosis present

## 2019-08-04 DIAGNOSIS — I4821 Permanent atrial fibrillation: Secondary | ICD-10-CM

## 2019-08-04 DIAGNOSIS — Z9989 Dependence on other enabling machines and devices: Secondary | ICD-10-CM

## 2019-08-04 DIAGNOSIS — G4730 Sleep apnea, unspecified: Secondary | ICD-10-CM

## 2019-08-04 LAB — URINALYSIS, ROUTINE W REFLEX MICROSCOPIC
Bilirubin Urine: NEGATIVE
Glucose, UA: NEGATIVE mg/dL
Ketones, ur: NEGATIVE mg/dL
Nitrite: NEGATIVE
Protein, ur: 100 mg/dL — AB
RBC / HPF: >50 RBC/hpf — ABNORMAL HIGH (ref 0–5)
Specific Gravity, Urine: >1.046 — ABNORMAL HIGH (ref 1.005–1.030)
WBC, UA: >50 WBC/hpf — ABNORMAL HIGH (ref 0–5)
pH: 5 (ref 5.0–8.0)

## 2019-08-04 LAB — CBC WITH DIFFERENTIAL/PLATELET
Abs Immature Granulocytes: 0.04 10*3/uL (ref 0.00–0.07)
Basophils Absolute: 0.1 10*3/uL (ref 0.0–0.1)
Basophils Relative: 1 %
Eosinophils Absolute: 0 10*3/uL (ref 0.0–0.5)
Eosinophils Relative: 0 %
HCT: 43.5 % (ref 36.0–46.0)
Hemoglobin: 14.2 g/dL (ref 12.0–15.0)
Immature Granulocytes: 0 %
Lymphocytes Relative: 10 %
Lymphs Abs: 1.3 10*3/uL (ref 0.7–4.0)
MCH: 30.9 pg (ref 26.0–34.0)
MCHC: 32.6 g/dL (ref 30.0–36.0)
MCV: 94.8 fL (ref 80.0–100.0)
Monocytes Absolute: 2.2 10*3/uL — ABNORMAL HIGH (ref 0.1–1.0)
Monocytes Relative: 17 %
Neutro Abs: 9.1 10*3/uL — ABNORMAL HIGH (ref 1.7–7.7)
Neutrophils Relative %: 72 %
Platelets: 228 10*3/uL (ref 150–400)
RBC: 4.59 MIL/uL (ref 3.87–5.11)
RDW: 12.8 % (ref 11.5–15.5)
WBC: 12.8 10*3/uL — ABNORMAL HIGH (ref 4.0–10.5)
nRBC: 0 % (ref 0.0–0.2)

## 2019-08-04 LAB — BASIC METABOLIC PANEL
Anion gap: 9 (ref 5–15)
BUN: 23 mg/dL (ref 8–23)
CO2: 31 mmol/L (ref 22–32)
Calcium: 9.6 mg/dL (ref 8.9–10.3)
Chloride: 100 mmol/L (ref 98–111)
Creatinine, Ser: 0.68 mg/dL (ref 0.44–1.00)
GFR calc Af Amer: >60 mL/min (ref >60)
GFR calc non Af Amer: >60 mL/min (ref >60)
Glucose, Bld: 120 mg/dL — ABNORMAL HIGH (ref 70–99)
Potassium: 4 mmol/L (ref 3.5–5.1)
Sodium: 140 mmol/L (ref 135–145)

## 2019-08-04 LAB — PROTIME-INR
INR: 3 — ABNORMAL HIGH (ref 0.8–1.2)
Prothrombin Time: 31.4 seconds — ABNORMAL HIGH (ref 11.4–15.2)

## 2019-08-04 LAB — SARS CORONAVIRUS 2 (TAT 6-24 HRS): SARS Coronavirus 2: NEGATIVE

## 2019-08-04 MED ORDER — DOCUSATE SODIUM 100 MG PO CAPS
100.0000 mg | ORAL_CAPSULE | Freq: Every day | ORAL | Status: DC
  Administered 2019-08-06 – 2019-08-08 (×3): 100 mg via ORAL
  Filled 2019-08-04 (×4): qty 1

## 2019-08-04 MED ORDER — FENTANYL CITRATE (PF) 100 MCG/2ML IJ SOLN
50.0000 ug | INTRAMUSCULAR | Status: DC | PRN
  Administered 2019-08-04: 100 ug via INTRAVENOUS
  Filled 2019-08-04: qty 2

## 2019-08-04 MED ORDER — HYDROCODONE-ACETAMINOPHEN 5-325 MG PO TABS: 1.0000 | ORAL_TABLET | Freq: Four times a day (QID) | ORAL | Status: DC | PRN

## 2019-08-04 MED ORDER — LEVOTHYROXINE SODIUM 75 MCG PO TABS
75.0000 ug | ORAL_TABLET | Freq: Every day | ORAL | Status: DC
  Administered 2019-08-05 – 2019-08-08 (×4): 75 ug via ORAL
  Filled 2019-08-04 (×6): qty 1

## 2019-08-04 MED ORDER — BACLOFEN 20 MG PO TABS: 20.0000 mg | ORAL_TABLET | Freq: Two times a day (BID) | ORAL | Status: DC | PRN

## 2019-08-04 MED ORDER — PSYLLIUM 95 % PO PACK
1.0000 | PACK | Freq: Every day | ORAL | Status: DC
  Administered 2019-08-05 – 2019-08-08 (×4): 1 via ORAL
  Filled 2019-08-04 (×5): qty 1

## 2019-08-04 MED ORDER — OMEGA-3-ACID ETHYL ESTERS 1 G PO CAPS
1000.0000 mg | ORAL_CAPSULE | Freq: Every day | ORAL | Status: DC
  Administered 2019-08-06 – 2019-08-08 (×3): 1000 mg via ORAL
  Filled 2019-08-04 (×5): qty 1

## 2019-08-04 MED ORDER — BIOTIN 1000 MCG PO TABS: 1000.0000 ug | ORAL_TABLET | Freq: Every day | ORAL | Status: DC

## 2019-08-04 MED ORDER — VITAMIN D3 25 MCG (1000 UNIT) PO TABS
2000.0000 [IU] | ORAL_TABLET | Freq: Two times a day (BID) | ORAL | Status: DC
  Administered 2019-08-05 – 2019-08-08 (×6): 2000 [IU] via ORAL
  Filled 2019-08-04 (×10): qty 2

## 2019-08-04 MED ORDER — GADOBUTROL 1 MMOL/ML IV SOLN
7.0000 mL | Freq: Once | INTRAVENOUS | Status: AC | PRN
  Administered 2019-08-04: 7 mL via INTRAVENOUS

## 2019-08-04 MED ORDER — LACTULOSE 10 GM/15ML PO SOLN: 30.0000 g | Freq: Every day | ORAL | Status: DC | PRN

## 2019-08-04 MED ORDER — DILTIAZEM HCL ER COATED BEADS 180 MG PO CP24
180.0000 mg | ORAL_CAPSULE | Freq: Every day | ORAL | Status: DC
  Administered 2019-08-04 – 2019-08-08 (×5): 180 mg via ORAL
  Filled 2019-08-04 (×5): qty 1

## 2019-08-04 MED ORDER — FENTANYL CITRATE (PF) 100 MCG/2ML IJ SOLN
100.0000 ug | Freq: Once | INTRAMUSCULAR | Status: AC
  Administered 2019-08-04: 100 ug via INTRAVENOUS
  Filled 2019-08-04: qty 2

## 2019-08-04 MED ORDER — SODIUM CHLORIDE 0.9 % IV SOLN
1.0000 g | INTRAVENOUS | Status: DC
  Administered 2019-08-04 – 2019-08-05 (×2): 1 g via INTRAVENOUS
  Filled 2019-08-04 (×2): qty 10

## 2019-08-04 MED ORDER — DOXYCYCLINE HYCLATE 100 MG PO TABS
100.0000 mg | ORAL_TABLET | Freq: Two times a day (BID) | ORAL | Status: DC
  Administered 2019-08-04 – 2019-08-08 (×9): 100 mg via ORAL
  Filled 2019-08-04 (×9): qty 1

## 2019-08-04 MED ORDER — ONDANSETRON HCL 4 MG/2ML IJ SOLN
4.0000 mg | Freq: Four times a day (QID) | INTRAMUSCULAR | Status: DC | PRN
  Administered 2019-08-07: 4 mg via INTRAVENOUS
  Filled 2019-08-04: qty 2

## 2019-08-04 MED ORDER — SIMVASTATIN 20 MG PO TABS
10.0000 mg | ORAL_TABLET | Freq: Every evening | ORAL | Status: DC
  Administered 2019-08-04 – 2019-08-08 (×5): 10 mg via ORAL
  Filled 2019-08-04 (×6): qty 1

## 2019-08-04 MED ORDER — HYDROCODONE-ACETAMINOPHEN 5-325 MG PO TABS
1.0000 | ORAL_TABLET | Freq: Four times a day (QID) | ORAL | Status: DC | PRN
  Administered 2019-08-04: 1 via ORAL
  Filled 2019-08-04: qty 1

## 2019-08-04 MED ORDER — FAMOTIDINE 20 MG PO TABS
20.0000 mg | ORAL_TABLET | Freq: Every day | ORAL | Status: DC
  Administered 2019-08-04 – 2019-08-07 (×4): 20 mg via ORAL
  Filled 2019-08-04 (×4): qty 1

## 2019-08-04 MED ORDER — PANTOPRAZOLE SODIUM 40 MG PO TBEC
40.0000 mg | DELAYED_RELEASE_TABLET | Freq: Every day | ORAL | Status: DC
  Administered 2019-08-04 – 2019-08-08 (×4): 40 mg via ORAL
  Filled 2019-08-04 (×5): qty 1

## 2019-08-04 MED ORDER — BUPROPION HCL ER (XL) 300 MG PO TB24
300.0000 mg | ORAL_TABLET | Freq: Every day | ORAL | Status: DC
  Administered 2019-08-05 – 2019-08-08 (×4): 300 mg via ORAL
  Filled 2019-08-04: qty 1
  Filled 2019-08-04: qty 2
  Filled 2019-08-04 (×2): qty 1

## 2019-08-04 MED ORDER — NEBIVOLOL HCL 5 MG PO TABS
5.0000 mg | ORAL_TABLET | Freq: Every day | ORAL | Status: DC
  Administered 2019-08-04 – 2019-08-08 (×5): 5 mg via ORAL
  Filled 2019-08-04 (×5): qty 1

## 2019-08-04 MED ORDER — GABAPENTIN 300 MG PO CAPS
300.0000 mg | ORAL_CAPSULE | Freq: Four times a day (QID) | ORAL | Status: DC
  Administered 2019-08-04 – 2019-08-08 (×17): 300 mg via ORAL
  Filled 2019-08-04 (×18): qty 1

## 2019-08-04 MED ORDER — ONDANSETRON HCL 4 MG PO TABS: 4.0000 mg | ORAL_TABLET | Freq: Four times a day (QID) | ORAL | Status: DC | PRN

## 2019-08-04 MED ORDER — ACETAMINOPHEN 500 MG PO TABS
500.0000 mg | ORAL_TABLET | Freq: Four times a day (QID) | ORAL | Status: DC | PRN
  Administered 2019-08-05 – 2019-08-07 (×3): 1000 mg via ORAL
  Filled 2019-08-04 (×3): qty 2

## 2019-08-04 MED ORDER — FUROSEMIDE 20 MG PO TABS
20.0000 mg | ORAL_TABLET | Freq: Two times a day (BID) | ORAL | Status: DC
  Administered 2019-08-05 – 2019-08-08 (×7): 20 mg via ORAL
  Filled 2019-08-04 (×11): qty 1

## 2019-08-04 MED ORDER — ALPRAZOLAM 0.5 MG PO TABS
0.5000 mg | ORAL_TABLET | Freq: Every evening | ORAL | Status: DC | PRN
  Administered 2019-08-06 – 2019-08-07 (×2): 0.5 mg via ORAL
  Filled 2019-08-04 (×2): qty 1

## 2019-08-04 MED ORDER — METOPROLOL TARTRATE 5 MG/5ML IV SOLN
5.0000 mg | Freq: Once | INTRAVENOUS | Status: AC
  Administered 2019-08-04: 5 mg via INTRAVENOUS
  Filled 2019-08-04: qty 5

## 2019-08-04 MED ORDER — TAMSULOSIN HCL 0.4 MG PO CAPS
0.4000 mg | ORAL_CAPSULE | Freq: Every day | ORAL | Status: DC
  Administered 2019-08-08: 0.4 mg via ORAL
  Filled 2019-08-04 (×5): qty 1

## 2019-08-04 MED ORDER — SODIUM CHLORIDE (PF) 0.9 % IJ SOLN
INTRAMUSCULAR | Status: AC
  Administered 2019-08-04: 04:00:00
  Filled 2019-08-04: qty 50

## 2019-08-04 MED ORDER — IOHEXOL 300 MG/ML  SOLN
100.0000 mL | Freq: Once | INTRAMUSCULAR | Status: AC | PRN
  Administered 2019-08-04: 100 mL via INTRAVENOUS

## 2019-08-04 MED ORDER — FLUTICASONE PROPIONATE 50 MCG/ACT NA SUSP
2.0000 | Freq: Every day | NASAL | Status: DC | PRN
  Filled 2019-08-04: qty 16

## 2019-08-04 NOTE — Progress Notes (Signed)
ANTICOAGULATION CONSULT NOTE - Initial Consult  Pharmacy Consult for warfarin Indication: atrial fibrillation  Allergies  Allergen Reactions  . Hydrocodone Nausea And Vomiting    Hydrocodone causes vomiting  . Oxycodone Nausea And Vomiting    Oral oxycodone causes vomiting  . Chlorhexidine Gluconate Rash    Sores at site  . Zoloft [Sertraline] Hives, Swelling and Rash    Patient Measurements: Height: 5\' 3"  (160 cm) Weight: 170 lb (77.1 kg) IBW/kg (Calculated) : 52.4   Vital Signs: Temp: 98.4 F (36.9 C) (12/13 2341) Temp Source: Oral (12/13 2341) BP: 119/62 (12/14 0300) Pulse Rate: 151 (12/14 0300)  Labs: Recent Labs    08/04/19 0116  HGB 14.2  HCT 43.5  PLT 228  LABPROT 31.4*  INR 3.0*  CREATININE 0.68    Estimated Creatinine Clearance: 62.5 mL/min (by C-G formula based on SCr of 0.68 mg/dL).   Medical History: Past Medical History:  Diagnosis Date  . Abnormality of gait 03/01/2015  . Anxiety   . Arthritis    "knees" (01/19/2016)  . Atrial fibrillation with RVR ([place]) 01/19/2016  . Back pain   . Chest pain   . Childhood asthma   . Chronic pain    "nerve pain from the MS"  . Constipation   . Depression   . DVT (deep venous thrombosis) ([place]) 1983   "RLE; may have just been phlebitis; it was before the age of dopplers"  . 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 GERD (gastroesophageal reflux disease)   . History of stress test 06/21/10   limited exam with some degree of breast attenuatin of the apex however a component of apical ischemia is not excluded, EF 62%; cardiac cath   . HLD (hyperlipidemia)   . Hx of echocardiogram 04/22/10   EF 55-60%, no valve issues  . Hypertension   . Hypothyroidism   . Joint pain   . Migraine    "none since I stopped beta blocker in 1990s" (01/19/2016)  . Multiple sclerosis ([place])    has had this may 1989-Dohmeir reg doc  . OSA on CPAP   . Osteoarthritis   . PAF  (paroxysmal atrial fibrillation) ([place])    on coumadin; documented on monitor 06/2010  . Pneumonia 11/2011  . Shortness of breath   . Thyroid disease     Medications:  Scheduled:  . baclofen  40 mg Intrathecal Once   Infusions:    Assessment: 84 yoF with extremity weakness on chronic warfarin for a-fib.  HD 5 mg daily, 2.5 mg Fridays. LD = 12/13 2200, INR = 3  Goal of Therapy:  INR 2-3    Plan:  Daily PT/INR  Lawana Pai R 08/04/2019,4:59 AM

## 2019-08-04 NOTE — Telephone Encounter (Signed)
Currently a inpatient and receiving doxycycline 100mg  po bid

## 2019-08-04 NOTE — Progress Notes (Signed)
PHARMACIST - PHYSICIAN ORDER COMMUNICATION  CONCERNING: P&T Medication Policy on Herbal Medications  DESCRIPTION:  This patient's order for: Biotin has been noted.  This product(s) is classified as an "herbal" or natural product. Due to a lack of definitive safety studies or FDA approval, nonstandard manufacturing practices, plus the potential risk of unknown drug-drug interactions while on inpatient medications, the Pharmacy and Therapeutics Committee does not permit the use of "herbal" or natural products of this type within [facility].   ACTION TAKEN: The pharmacy department is unable to verify this order at this time and your patient has been informed of this safety policy. Please re-evaluate patient's clinical condition at discharge and address if the herbal or natural product(s) should be resumed at that time.   Lindell Spar, PharmD, BCPS Clinical Pharmacist  08/04/2019 11:00 AM

## 2019-08-04 NOTE — ED Provider Notes (Signed)
[facility] DEPT Provider Note   CSN: [id number] Arrival date & time: 08/03/19  2329     History Chief Complaint  Patient presents with  . Extremity Weakness    Toni [name] is a 73 y.o. female.  The history is provided by the patient.  Weakness Severity:  Moderate Onset quality:  Gradual Timing:  Constant Progression:  Worsening Chronicity:  Recurrent Relieved by:  Nothing Worsened by:  Activity Associated symptoms: no chest pain, no fever and no vomiting       Patient with history of atrial fibrillation, multiple sclerosis, obesity presents with weakness.  Patient reports chronic weakness in her legs, usually right leg is worse than left.  Over the past day she had increasing weakness in the right leg and numbness.  She also reports a burning type pain in her right buttock.  She had a recent evaluation for this pain and had a negative x-ray of her right hip No recent falls in the past several days.  She reports this weakness has been worse over the past 24 hours.  This feels similar to her MS flare but she does have more pain than usual.  No new arm weakness. No other acute complaints Past Medical History:  Diagnosis Date  . Abnormality of gait 03/01/2015  . Anxiety   . Arthritis    "knees" (01/19/2016)  . Atrial fibrillation with RVR ([place]) 01/19/2016  . Back pain   . Chest pain   . Childhood asthma   . Chronic pain    "nerve pain from the MS"  . Constipation   . Depression   . DVT (deep venous thrombosis) ([place]) 1983   "RLE; may have just been phlebitis; it was before the age of dopplers"  . 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 GERD (gastroesophageal reflux disease)   . History of stress test 06/21/10   limited exam with some degree of breast attenuatin of the apex however a component of apical ischemia is not excluded, EF 62%; cardiac cath   . HLD (hyperlipidemia)   . Hx of  echocardiogram 04/22/10   EF 55-60%, no valve issues  . Hypertension   . Hypothyroidism   . Joint pain   . Migraine    "none since I stopped beta blocker in 1990s" (01/19/2016)  . Multiple sclerosis ([place])    has had this may 1989-[place] reg doc  . OSA on CPAP   . Osteoarthritis   . PAF (paroxysmal atrial fibrillation) ([place])    on coumadin; documented on monitor 06/2010  . Pneumonia 11/2011  . Shortness of breath   . Thyroid disease     Patient Active Problem List   Diagnosis Date Noted  . Unilateral primary osteoarthritis, left knee 01/09/2019  . Type 2 diabetes mellitus without complication, without long-term current use of insulin ([place]) 08/27/2018  . Obstructive sleep apnea treated with bilevel positive airway pressure (BiPAP) 08/15/2018  . Sleep apnea treated with nocturnal BiPAP 04/23/2018  . 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 ([place]) 11/21/2017  . Permanent atrial fibrillation ([place]) 11/21/2017  . Tightness of leg fascia 11/21/2017  . Muscle spasm of both lower legs 11/21/2017  . Carpal tunnel syndrome, left upper limb 09/13/2017  . Carpal tunnel syndrome, right upper limb 09/13/2017  . Other insomnia 06/11/2017  . Depression 06/11/2017  . Abdominal swelling 03/29/2017  . Central apnea 03/26/2017  . Class  3 obesity with serious comorbidity and body mass index (BMI) of 40.0 to 44.9 in adult 03/22/2017  . Vitamin D deficiency 03/22/2017  . Prediabetes 03/22/2017  . Venous insufficiency 02/14/2017  . Right hand weakness 08/06/2016  . Nonischemic cardiomyopathy ([place]) 05/09/2016  . Localized edema 05/09/2016  . Long term current use of anticoagulant therapy 02/03/2016  . 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 Atrial fibrillation with RVR ([place]) 01/19/2016  . 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 Obesity hypoventilation syndrome ([place]) 03/30/2015  . Abnormality of gait 03/01/2015  .  Chronic constipation with overflow incontinence 01/25/2015  . 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) ([employer]) 01/25/2015  . 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 MS (multiple sclerosis) ([place]) 07/29/2014  . Closed right ankle fracture 10/30/2013  . Leucocytosis 10/30/2013  . Varicose veins of both lower extremities with complications 0000000  . OSA on CPAP 06/04/2013  . Muscle spasms of lower extremity 02/24/2013  . Long term (current) use of anticoagulants 11/05/2012  . 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 Aspiration pneumonia vs Failed outpatient Rx of CAP 11/26/2011  . Essential hypertension   . Thyroid disease   . HLD (hyperlipidemia)     Past Surgical History:  Procedure Laterality Date  . ANTERIOR CERVICAL DECOMP/DISCECTOMY FUSION  01/2004  . BACK SURGERY    . CARDIAC CATHETERIZATION  06/22/2010   normal coronary arteries, PAF  . CATARACT EXTRACTION, BILATERAL Bilateral   . CHILD BIRTHS     X2  . DILATION AND CURETTAGE OF UTERUS    . INFUSION PUMP IMPLANTATION  ~ 2009   baclofen infusion in lower abd  . PAIN PUMP REVISION N/A 07/29/2014   Procedure: Baclofen pump replacement;  Surgeon: Erline Levine, MD;  Location: [facility];  Service: Neurosurgery;  Laterality: N/A;  Baclofen pump replacement  . TONSILLECTOMY AND ADENOIDECTOMY    . VARICOSE VEIN SURGERY  ~ 1968  . VENOUS ABLATION  12/16/2010   radiofreq ablation -Dr Elisabeth Cara and [facility]  . WISDOM TOOTH EXTRACTION       OB History    Gravida  2   Para      Term      Preterm      AB      Living        SAB      TAB      Ectopic      Multiple      Live Births              Family History  Problem Relation Age of Onset  . Coronary artery disease Father        at age 59  . Hyperlipidemia Father   . Thyroid disease Father   . Coronary artery disease Maternal  Grandmother   . Depression Maternal Grandmother   . Cancer Paternal Grandmother   . Depression Mother   . Sudden death Mother   . Anxiety disorder Mother   . Alcoholism Son     Social History   Tobacco Use  . Smoking status: Former Smoker    Packs/day: 0.50    Years: 7.00    Pack years: 3.50    Types: Cigarettes    Quit date: 08/21/1976    Years since quitting: 42.9  . Smokeless tobacco: Never Used  Substance Use Topics  . Alcohol use: Yes  Alcohol/week: 1.0 standard drinks    Types: 1 Glasses of wine per week  . Drug use: No    Home Medications Prior to Admission medications   Medication Sig Start Date End Date Taking? Authorizing Provider  acetaminophen (TYLENOL) 500 MG tablet Take 500-1,000 mg by mouth every 6 (six) hours as needed for fever or headache (or pain).    [provider]  ALPRAZolam (XANAX) 0.5 MG tablet TAKE 1 TABLET BY MOUTH AT  BEDTIME AS NEEDED FOR  ANXIETY OR SLEEP Patient taking differently: Take 0.5 mg by mouth at bedtime as needed for anxiety.  05/05/19   Ward Givens, NP  baclofen (LIORESAL) 20 MG tablet Once to two tablets a day for breakthrough spasms Patient taking differently: Take 20 mg by mouth 2 (two) times daily as needed for muscle spasms.  08/27/18   Dohmeier, Asencion Partridge, MD  Biotin (PA BIOTIN) 1000 MCG tablet Take 1,000 mcg by mouth daily.    [provider]  buPROPion (WELLBUTRIN XL) 300 MG 24 hr tablet Take 1 tablet (300 mg total) by mouth daily. 08/23/17   Dennard Nip D, MD  BYSTOLIC 5 MG tablet Take 5 mg by mouth daily.  03/26/19   [provider]  CALCIUM CITRATE PO Take 2 tablets by mouth 2 (two) times daily.    [provider]  cetirizine (ZYRTEC) 10 MG tablet Take 10 mg by mouth daily as needed for allergies.     [provider]  Cholecalciferol (VITAMIN D) 2000 units tablet Take 1 tablet (2,000 Units total) by mouth 2 (two) times daily. 03/22/17   Dennard Nip D, MD  co-enzyme Q-10 30 MG  capsule Take 30 mg by mouth daily.    [provider]  corticotropin (ACTHAR) 80 UNIT/ML injectable gel INJECT 80 UNITS (1ML) DAILY FOR 10 DAYS. DUE TO MS EXACERBATION AND THEN CAN RESUME NEXT MONTH AT PREVIOUS DOSE. 06/18/19   Dohmeier, Asencion Partridge, MD  Cranberry 1000 MG CAPS Take 2 capsules by mouth 2 (two) times daily.     [provider]  diltiazem (CARDIZEM CD) 180 MG 24 hr capsule TAKE 1 CAPSULE BY MOUTH  DAILY 01/14/19   Hilty, Nadean Corwin, MD  docusate sodium (COLACE) 100 MG capsule Take 1 capsule (100 mg total) by mouth daily. 08/12/16   Rai, Vernelle Emerald, MD  famotidine (PEPCID) 20 MG tablet Take 20 mg by mouth at bedtime.  06/11/18   [provider]  fluticasone (FLONASE) 50 MCG/ACT nasal spray Place 2 sprays into both nostrils daily as needed for allergies.  03/23/15   [provider]  furosemide (LASIX) 40 MG tablet Take 20 mg by mouth 2 (two) times daily.     [provider]  gabapentin (NEURONTIN) 300 MG capsule TAKE 1 CAPSULE BY MOUTH UP  TO 4 TIMES DAILY Patient taking differently: Take 300 mg by mouth 4 (four) times daily.  12/31/18   Dohmeier, Asencion Partridge, MD  GENERLAC 10 GM/15ML SOLN TAKE 15 ML BY MOUTH TWICE DAILY AS NEEDED Patient taking differently: Take 30 g by mouth daily as needed (constipation).  06/25/19   Dohmeier, Asencion Partridge, MD  HYDROcodone-acetaminophen (NORCO/VICODIN) 5-325 MG tablet Take 1 tablet by mouth every 6 (six) hours as needed. 08/01/19   Charlann Lange, PA-C  levothyroxine (SYNTHROID, LEVOTHROID) 75 MCG tablet Take 75 mcg by mouth daily.    [provider]  linaclotide (LINZESS) 145 MCG CAPS capsule Take 1 capsule (145 mcg total) by mouth daily before breakfast. Patient not taking: Reported on  07/31/2019 04/11/19   Thornton Park, MD  Multiple Vitamins-Minerals (CENTRUM SILVER PO) Take 1 tablet by mouth daily.    [provider]  Omega-3 Fatty Acids (FISH OIL) 1000 MG CAPS Take 1,000 mg by mouth daily.    [provider]  omeprazole (PRILOSEC) 20 MG capsule Take 20 mg by mouth daily.     [provider]  psyllium (METAMUCIL SMOOTH TEXTURE) 28 % packet Take 1 packet by mouth daily.     [provider]  simvastatin (ZOCOR) 10 MG tablet Take 10 mg by mouth every evening.  03/01/18   [provider]  SYRINGE/NEEDLE, DISP, 1 ML (BD LUER-LOK SYRINGE) 25G X 5/8" 1 ML MISC Use as directed to inject  Acthar 06/15/15   Dohmeier, Asencion Partridge, MD  Tamsulosin HCl (FLOMAX) 0.4 MG CAPS Take 0.4 mg by mouth daily.    [provider]  UNABLE TO FIND Baclofen pump: Continuous    [provider]  warfarin (COUMADIN) 5 MG tablet TAKE 1 TABLET BY MOUTH  DAILY AS DIRECTED BY  COUMADIN CLINIC Patient taking differently: Take 2.5-5 mg by mouth as directed. Take 1 tablet (5 mg) daily except on Friday Take 1/2 tablet (2.5 mg) as directed by coumadin clinic. 10/30/18   Hilty, Nadean Corwin, MD    Allergies    Hydrocodone, Oxycodone, Chlorhexidine gluconate, and Zoloft [sertraline]  Review of Systems   Review of Systems  Constitutional: Negative for fever.  Cardiovascular: Negative for chest pain.  Gastrointestinal: Negative for vomiting.  Neurological: Positive for weakness and numbness.  All other systems reviewed and are negative.   Physical Exam Updated Vital Signs BP (!) 145/90 (BP Location: Left Arm)   Pulse 100   Temp 98.4 F (36.9 C) (Oral)   Resp 18   Ht 1.6 m (5\' 3" )   Wt 77.1 kg   SpO2 99%   BMI 30.11 kg/m   Physical Exam CONSTITUTIONAL: Chronically ill-appearing HEAD: Normocephalic/atraumatic EYES: EOMI/PERRL ENMT: Mucous membranes moist NECK: supple no meningeal signs CV: S1/S2 noted, no murmurs/rubs/gallops noted LUNGS: Lungs are clear to auscultation bilaterally, no apparent distress ABDOMEN: soft, nontender, baclofen pump noted NEURO: Pt is awake/alert/appropriate, no arm drift, equal strength in both arms 3 out of 5 strength with right hip flexion  extension, 3/5 strength with right knee flexion extension.  5 out of 5 strength with right foot plantar/dorsiflexion 4 out of 5 strength with left hip flexion extension, 4 out of 5 strength with left knee flexion extension, 5 out of 5 strength with left foot plantar/dorsiflexion EXTREMITIES: Legs are warm to palpation, chronic edema lower extremities, pelvis stable, no deformities SKIN: warm, color normal PSYCH: no abnormalities of mood noted, alert and oriented to situation  ED Results / Procedures / Treatments   Labs (all labs ordered are listed, but only abnormal results are displayed) Labs Reviewed  BASIC METABOLIC PANEL - Abnormal; Notable for the following components:      Result Value   Glucose, Bld 120 (*)    All other components within normal limits  CBC WITH DIFFERENTIAL/PLATELET - Abnormal; Notable for the following components:   WBC 12.8 (*)    Neutro Abs 9.1 (*)    Monocytes Absolute 2.2 (*)    All other components within normal limits  PROTIME-INR - Abnormal; Notable for the following components:   Prothrombin Time 31.4 (*)    INR 3.0 (*)    All other components within normal limits  SARS CORONAVIRUS 2 (TAT 6-24 HRS)  URINALYSIS,  ROUTINE W REFLEX MICROSCOPIC    EKG None  Radiology CT ABDOMEN PELVIS W CONTRAST  Result Date: 08/04/2019 CLINICAL DATA:  Fall 3 weeks ago. Persistent right buttock pain. Pain radiates to both hips. Evaluate for intra-abdominal/retroperitoneal hematoma. On anticoagulation. EXAM: CT ABDOMEN AND PELVIS WITH CONTRAST TECHNIQUE: Multidetector CT imaging of the abdomen and pelvis was performed using the standard protocol following bolus administration of intravenous contrast. CONTRAST:  150mL OMNIPAQUE IOHEXOL 300 MG/ML  SOLN COMPARISON:  Abdominopelvic CT 02/25/2019 05/11/2014 FINDINGS: Lower chest: No focal airspace disease. Trace left pleural thickening without frank effusion. Borderline cardiomegaly. No basilar rib fracture. Previous right  middle lobe pulmonary nodules not included in the field of view. Hepatobiliary: No focal hepatic abnormality or hepatic injury. No perihepatic hematoma. Gallbladder physiologically distended, no calcified stone. No biliary dilatation. Pancreas: No ductal dilatation or inflammation. Spleen: Normal in size without focal abnormality. No evidence of splenic injury. Adrenals/Urinary Tract: Normal adrenal glands. No hydronephrosis. The enhancing left renal lesion in the upper pole is less well visualized on the current exam, however appears grossly unchanged in size measuring approximately 16 mm, series 5, image 134. Additional simple cyst in the lower left kidney is again seen. No hydronephrosis. No evidence of renal injury. Symmetric excretion on delayed phase imaging. Urinary bladder is distended without wall thickening. Stomach/Bowel: Ingested material in the stomach. No bowel wall thickening or evidence of injury. No mesenteric hematoma. There is fecalization of distal small bowel contents. Normal appendix. Large volume of stool throughout the colon with colonic tortuosity. No mesenteric hematoma. Vascular/Lymphatic: Mild aortic atherosclerosis. No evidence of aortic injury. IVC is intact. No retroperitoneal fluid. No retroperitoneal stranding or evidence of retroperitoneal bleed. No adenopathy. Reproductive: Uterus is unremarkable. No suspicious adnexal mass. Slight right ovarian prominence is unchanged from prior. Other: No free fluid or evidence intra-abdominal bleed. No retroperitoneal hemorrhage. No free air. Minimal patchy subcutaneous edema of the right posterior flank without confluent hematoma. Musculoskeletal: Spinal stimulator in place with battery pack in the right anterior abdominal wall. Minimal cortical buckling of the mid sacrum may represent a nondisplaced fracture, series 5, image 100. No other fracture of the lumbar spine or pelvis. Multilevel degenerative change throughout the lumbar spine.  IMPRESSION: 1. Minimal cortical buckling of the mid sacrum may represent a nondisplaced fracture. 2. Minimal patchy subcutaneous edema of the right posterior flank without confluent hematoma. No evidence of intra-abdominal or retroperitoneal bleed. No evidence of acute intra-abdominal/pelvic injury. 3. Enhancing left renal lesion measuring 16 mm, unchanged from abdominal CT 02/25/2019. 4. Large volume of stool throughout the colon with colonic tortuosity in fecalization of distal small bowel contents consistent with constipation and slow bowel transit. Aortic Atherosclerosis (ICD10-I70.0). Electronically Signed   By: Keith Rake M.D.   On: 08/04/2019 04:06    Procedures Procedures    Medications Ordered in ED Medications  fentaNYL (SUBLIMAZE) injection 100 mcg (100 mcg Intravenous Given 08/04/19 0123)  sodium chloride (PF) 0.9 % injection (  Given by Other 08/04/19 0345)  iohexol (OMNIPAQUE) 300 MG/ML solution 100 mL (100 mLs Intravenous Contrast Given 08/04/19 0341)    ED Course  I have reviewed the triage vital signs and the nursing notes.  Pertinent labs   results that were available during my care of the patient were reviewed by me and considered in my medical decision making (see chart for details).    MDM Rules/Calculators/A&P  1:01 AM Patient has a history of MS who feels she is having an MS flare.  She takes Acthar for flares Will consult neurology 2:11 AM Discussed with teleneurologist.  He recommends obtaining CT abdomen pelvis to rule out any sort of intra-abdominal or retroperitoneal hematoma as she is on Coumadin.  If this is negative then she will require MR I with and without contrast of cervical/thoracic/lumbar spine and will need to be admitted. 4:46 AM CT reveals possible nondisplaced sacral fracture which could be the source of some of her pain.  No signs of any intra-abdominal retroperitoneal bleed Discussed with Dr. Alcario Drought for  admission.  Per neurology recommendations will require MRI imaging of her spine, but her baclofen pump will need to be deactivated during procedure   Final Clinical Impression(s) / ED Diagnoses Final diagnoses:  Closed fracture of sacrum, unspecified portion of sacrum, initial encounter ([place])  Weakness  Constipation, unspecified constipation type    Rx / DC Orders ED Discharge Orders    None       Ripley Fraise, MD 08/04/19 [phone]

## 2019-08-04 NOTE — ED Notes (Signed)
Pt asking about Warfarin, as she takes at night. Medication not listed on patient's MAR, sent message to Dr Grandville Silos.

## 2019-08-04 NOTE — ED Notes (Signed)
Pt advised unable to void at this time, will monitor.

## 2019-08-04 NOTE — ED Notes (Signed)
Respiratory made aware of bipap needed. Will come and set pt up.

## 2019-08-04 NOTE — Progress Notes (Addendum)
I have seen and assessed patient and agree with Dr. Juleen China assessment and plan.  Patient is a pleasant 73 year old female wheelchair-bound able to transfer on her own with history of MS diagnosed in 1989 on baclofen pump, hypertension, A. fib on Coumadin presented to the ED after a fall on her buttocks 3 weeks prior to admission coming in with worsening right-sided buttock pain with radiation to the left side with increased lower extremity weakness and difficulty transferring.  Patient was seen by telemetry neurology who had recommended evaluation by inpatient neurology as well as MRI of the C, T and L-spine to rule out a MS flare.  Infectious etiologies being worked up.  Urinalysis done was turbid with large leukocytes, nitrite negative, greater than 50 WBCs.  Check a urine culture.  Will place empirically on IV Rocephin.  Patient also placed on doxycycline as urine cultures from 07/31/2019 with greater than 100,000 colonies of Staphylococcu simulans.  IV fluids.  Pain management.  Supportive care.  At time of reassessment of patient in the ED heart rate noted to be in A. fib with rates of 130s to 140s.  Patient supposedly just received her oral Cardizem and Bystolic.  We will give a dose of Lopressor 5 mg IV x1 and change bed assignment to a telemetry bed.  Consult with neurology for further evaluation and management.  No charge.

## 2019-08-04 NOTE — H&P (Signed)
History and Physical    LORESA [name] [id number] DOB: [birth year] DOA: 08/03/2019  PCP: Prince Solian, MD  Patient coming from: Home  I have personally briefly reviewed patient's old medical records in [place]  Chief Complaint: Worsening buttock and BLE pain  HPI: Toni [name] is a 73 y.o. female with medical history significant of MS diagnosed in 1989, HTN, A.Fib on coumadin.  At baseline she is mostly wheelchair bound but can transfer.  Looks like she takes Acthar as needed for MS flares but not on any chronic disease modifying therapies.  ~3 weeks ago, patient had fall, landed on buttock.  Came to ED on Thurs for continued R sided buttock pain.  X ray is ? For non-displaced fracture, she was discharged home.  Today noticed that her R sided buttock pain started to radiate to L side.  B legs seemed weaker and having trouble transfering herself, normally able to transfer independently.  Presents to ED.  ED Course: Feeling better after pain meds.  CT abd/pelvis shows no obvious hematoma, does show questionable non-displaced sacral fracture.  Of note her UA from 12/10 has come back growing staph simulans.  Tele-neuro consulted (see their extensive note).  Hospitalist asked to admit.   Review of Systems: As per HPI, otherwise all review of systems negative.  Past Medical History:  Diagnosis Date  . Abnormality of gait 03/01/2015  . Anxiety   . Arthritis    "knees" (01/19/2016)  . Atrial fibrillation with RVR ([place]) 01/19/2016  . Back pain   . Chest pain   . Childhood asthma   . Chronic pain    "nerve pain from the MS"  . Constipation   . Depression   . DVT (deep venous thrombosis) ([place]) 1983   "RLE; may have just been phlebitis; it was before the age of dopplers"  . 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 GERD (gastroesophageal reflux disease)   . History of stress test 06/21/10   limited exam with  some degree of breast attenuatin of the apex however a component of apical ischemia is not excluded, EF 62%; cardiac cath   . HLD (hyperlipidemia)   . Hx of echocardiogram 04/22/10   EF 55-60%, no valve issues  . Hypertension   . Hypothyroidism   . Joint pain   . Migraine    "none since I stopped beta blocker in 1990s" (01/19/2016)  . Multiple sclerosis ([place])    has had this may 1989-[place] reg doc  . OSA on CPAP   . Osteoarthritis   . PAF (paroxysmal atrial fibrillation) ([place])    on coumadin; documented on monitor 06/2010  . Pneumonia 11/2011  . Shortness of breath   . Thyroid disease     Past Surgical History:  Procedure Laterality Date  . ANTERIOR CERVICAL DECOMP/DISCECTOMY FUSION  01/2004  . BACK SURGERY    . CARDIAC CATHETERIZATION  06/22/2010   normal coronary arteries, PAF  . CATARACT EXTRACTION, BILATERAL Bilateral   . CHILD BIRTHS     X2  . DILATION AND CURETTAGE OF UTERUS    . INFUSION PUMP IMPLANTATION  ~ 2009   baclofen infusion in lower abd  . PAIN PUMP REVISION N/A 07/29/2014   Procedure: Baclofen pump replacement;  Surgeon: Erline Levine, MD;  Location: [facility];  Service: Neurosurgery;  Laterality: N/A;  Baclofen pump replacement  . TONSILLECTOMY AND ADENOIDECTOMY    .  VARICOSE VEIN SURGERY  ~ 1968  . VENOUS ABLATION  12/16/2010   radiofreq ablation -Dr Elisabeth Cara and [facility]  . WISDOM TOOTH EXTRACTION       reports that she quit smoking about 42 years ago. Her smoking use included cigarettes. She has a 3.50 pack-year smoking history. She has never used smokeless tobacco. She reports current alcohol use of about 1.0 standard drinks of alcohol per week. She reports that she does not use drugs.  Allergies  Allergen Reactions  . Hydrocodone Nausea And Vomiting    Hydrocodone causes vomiting  . Oxycodone Nausea And Vomiting    Oral oxycodone causes vomiting  . Chlorhexidine Gluconate Rash    Sores at site  . Zoloft [Sertraline] Hives, Swelling and Rash     Family History  Problem Relation Age of Onset  . Coronary artery disease Father        at age 82  . Hyperlipidemia Father   . Thyroid disease Father   . Coronary artery disease Maternal Grandmother   . Depression Maternal Grandmother   . Cancer Paternal Grandmother   . Depression Mother   . Sudden death Mother   . Anxiety disorder Mother   . Alcoholism Son      Prior to Admission medications   Medication Sig Start Date End Date Taking? Authorizing Provider  acetaminophen (TYLENOL) 500 MG tablet Take 500-1,000 mg by mouth every 6 (six) hours as needed for fever or headache (or pain).   Yes [provider]  ALPRAZolam (XANAX) 0.5 MG tablet TAKE 1 TABLET BY MOUTH AT  BEDTIME AS NEEDED FOR  ANXIETY OR SLEEP Patient taking differently: Take 0.5 mg by mouth at bedtime as needed for anxiety.  05/05/19  Yes Ward Givens, NP  baclofen (LIORESAL) 20 MG tablet Once to two tablets a day for breakthrough spasms Patient taking differently: Take 20 mg by mouth 2 (two) times daily as needed for muscle spasms.  08/27/18  Yes Dohmeier, Asencion Partridge, MD  Biotin (PA BIOTIN) 1000 MCG tablet Take 1,000 mcg by mouth daily.   Yes [provider]  buPROPion (WELLBUTRIN XL) 300 MG 24 hr tablet Take 1 tablet (300 mg total) by mouth daily. 08/23/17  Yes Beasley, Caren D, MD  BYSTOLIC 5 MG tablet Take 5 mg by mouth daily.  03/26/19  Yes [provider]  CALCIUM CITRATE PO Take 2 tablets by mouth 2 (two) times daily.   Yes [provider]  cetirizine (ZYRTEC) 10 MG tablet Take 10 mg by mouth daily as needed for allergies.    Yes [provider]  Cholecalciferol (VITAMIN D) 2000 units tablet Take 1 tablet (2,000 Units total) by mouth 2 (two) times daily. 03/22/17  Yes Beasley, Caren D, MD  co-enzyme Q-10 30 MG capsule Take 30 mg by mouth daily.   Yes [provider]  corticotropin (ACTHAR) 80 UNIT/ML injectable gel INJECT 80 UNITS (1ML) DAILY FOR 10 DAYS. DUE TO MS  EXACERBATION AND THEN CAN RESUME NEXT MONTH AT PREVIOUS DOSE. 06/18/19  Yes Dohmeier, Asencion Partridge, MD  Cranberry 1000 MG CAPS Take 2 capsules by mouth 2 (two) times daily.    Yes [provider]  diltiazem (CARDIZEM CD) 180 MG 24 hr capsule TAKE 1 CAPSULE BY MOUTH  DAILY Patient taking differently: Take 180 mg by mouth daily.  01/14/19  Yes Hilty, Nadean Corwin, MD  docusate sodium (COLACE) 100 MG capsule Take 1 capsule (100 mg total) by mouth daily. 08/12/16  Yes Rai, Vernelle Emerald, MD  famotidine (PEPCID) 20 MG tablet Take 20 mg by mouth at bedtime.  06/11/18  Yes [provider]  fluticasone (FLONASE) 50 MCG/ACT nasal spray Place 2 sprays into both nostrils daily as needed for allergies.  03/23/15  Yes [provider]  furosemide (LASIX) 40 MG tablet Take 20 mg by mouth 2 (two) times daily.    Yes [provider]  gabapentin (NEURONTIN) 300 MG capsule TAKE 1 CAPSULE BY MOUTH UP  TO 4 TIMES DAILY Patient taking differently: Take 300 mg by mouth 4 (four) times daily.  12/31/18  Yes Dohmeier, Asencion Partridge, MD  GENERLAC 10 GM/15ML SOLN TAKE 15 ML BY MOUTH TWICE DAILY AS NEEDED Patient taking differently: Take 30 g by mouth daily as needed (constipation).  06/25/19  Yes Dohmeier, Asencion Partridge, MD  HYDROcodone-acetaminophen (NORCO/VICODIN) 5-325 MG tablet Take 1 tablet by mouth every 6 (six) hours as needed. Patient taking differently: Take 1 tablet by mouth every 6 (six) hours as needed for moderate pain or severe pain.  08/01/19  Yes Upstill, Nehemiah Settle, PA-C  levothyroxine (SYNTHROID, LEVOTHROID) 75 MCG tablet Take 75 mcg by mouth daily.   Yes [provider]  Multiple Vitamins-Minerals (CENTRUM SILVER PO) Take 1 tablet by mouth daily.   Yes [provider]  Omega-3 Fatty Acids (FISH OIL) 1000 MG CAPS Take 1,000 mg by mouth daily.   Yes [provider]  omeprazole (PRILOSEC) 20 MG capsule Take 20 mg by mouth daily.    Yes [provider]  psyllium (METAMUCIL  SMOOTH TEXTURE) 28 % packet Take 1 packet by mouth daily.    Yes [provider]  simvastatin (ZOCOR) 10 MG tablet Take 10 mg by mouth every evening.  03/01/18  Yes [provider]  SYRINGE/NEEDLE, DISP, 1 ML (BD LUER-LOK SYRINGE) 25G X 5/8" 1 ML MISC Use as directed to inject  Acthar 06/15/15  Yes Dohmeier, Asencion Partridge, MD  Tamsulosin HCl (FLOMAX) 0.4 MG CAPS Take 0.4 mg by mouth daily.   Yes [provider]  warfarin (COUMADIN) 5 MG tablet TAKE 1 TABLET BY MOUTH  DAILY AS DIRECTED BY  COUMADIN CLINIC Patient taking differently: Take 2.5-5 mg by mouth as directed. Take 1 tablet (5 mg) daily except on Friday Take 1/2 tablet (2.5 mg) as directed by coumadin clinic. 10/30/18  Yes Pixie Casino, MD    Physical Exam: Vitals:   08/03/19 2341 08/03/19 2345 08/04/19 0130 08/04/19 0300  BP: (!) 145/90  133/81 119/62  Pulse: 100  (!) 115 (!) 151  Resp: 18  (!) 30 14  Temp: 98.4 F (36.9 C)     TempSrc: Oral     SpO2: 98% 99% 100% 100%  Weight: 77.1 kg     Height: 5\' 3"  (1.6 m)       Constitutional: NAD, calm, comfortable Eyes: PERRL, lids and conjunctivae normal ENMT: Mucous membranes are moist. Posterior pharynx clear of any exudate or lesions.Normal dentition.  Neck: normal, supple, no masses, no thyromegaly Respiratory: clear to auscultation bilaterally, no wheezing, no crackles. Normal respiratory effort. No accessory muscle use.  Cardiovascular: Regular rate and rhythm, no murmurs / rubs / gallops. No extremity edema. 2+ pedal pulses. No carotid bruits.  Abdomen: no tenderness, no masses palpated. No hepatosplenomegaly. Bowel sounds positive.  Musculoskeletal: no clubbing / cyanosis. No joint deformity upper and lower extremities. Good ROM, no contractures. Normal muscle tone.  Skin: no rashes, lesions, ulcers. No induration Neurologic: CN 2-12 grossly intact. Sensation intact, DTR normal. Strength 5/5 in BUE.  3/5 strength R hip, 3/5 R knee , 5/5 R foot plantar and  dorsiflexion.  4/5 on L hip and knee.  5/5 L foot. Psychiatric: Normal judgment and insight. Alert and oriented x 3. Normal mood.    Labs on Admission: I have personally reviewed following labs and imaging studies  CBC: Recent Labs  Lab 08/04/19 0116  WBC 12.8*  NEUTROABS 9.1*  HGB 14.2  HCT 43.5  MCV 94.8  PLT [id number]   Basic Metabolic Panel: Recent Labs  Lab 08/04/19 0116  NA 140  K 4.0  CL 100  CO2 31  GLUCOSE 120*  BUN 23  CREATININE 0.68  CALCIUM 9.6   GFR: Estimated Creatinine Clearance: 62.5 mL/min (by C-G formula based on SCr of 0.68 mg/dL). Liver Function Tests: No results for input(s): AST, ALT, ALKPHOS, BILITOT, PROT, ALBUMIN in the last 168 hours. No results for input(s): LIPASE, AMYLASE in the last 168 hours. No results for input(s): AMMONIA in the last 168 hours. Coagulation Profile: Recent Labs  Lab 07/28/19 1525 08/04/19 0116  INR 3.4* 3.0*   Cardiac Enzymes: No results for input(s): CKTOTAL, CKMB, CKMBINDEX, TROPONINI in the last 168 hours. BNP (last 3 results) No results for input(s): PROBNP in the last 8760 hours. HbA1C: No results for input(s): HGBA1C in the last 72 hours. CBG: No results for input(s): GLUCAP in the last 168 hours. Lipid Profile: No results for input(s): CHOL, HDL, LDLCALC, TRIG, CHOLHDL, LDLDIRECT in the last 72 hours. Thyroid Function Tests: No results for input(s): TSH, T4TOTAL, FREET4, T3FREE, THYROIDAB in the last 72 hours. Anemia Panel: No results for input(s): VITAMINB12, FOLATE, FERRITIN, TIBC, IRON, RETICCTPCT in the last 72 hours. Urine analysis:    Component Value Date/Time   COLORURINE YELLOW 07/31/2019 2226   APPEARANCEUR HAZY (A) 07/31/2019 2226   APPEARANCEUR Clear 12/18/2014 1156   LABSPEC 1.006 07/31/2019 2226   PHURINE 8.0 07/31/2019 2226   GLUCOSEU NEGATIVE 07/31/2019 2226   HGBUR NEGATIVE 07/31/2019 2226   BILIRUBINUR NEGATIVE 07/31/2019 2226   BILIRUBINUR Negative 12/18/2014 [place] 07/31/2019 2226   PROTEINUR NEGATIVE 07/31/2019 2226   UROBILINOGEN 0.2 08/05/2014 0951   NITRITE NEGATIVE 07/31/2019 2226   LEUKOCYTESUR MODERATE (A) 07/31/2019 2226    Radiological Exams on Admission: CT ABDOMEN PELVIS W CONTRAST  Result Date: 08/04/2019 CLINICAL DATA:  Fall 3 weeks ago. Persistent right buttock pain. Pain radiates to both hips. Evaluate for intra-abdominal/retroperitoneal hematoma. On anticoagulation. EXAM: CT ABDOMEN AND PELVIS WITH CONTRAST TECHNIQUE: Multidetector CT imaging of the abdomen and pelvis was performed using the standard protocol following bolus administration of intravenous contrast. CONTRAST:  127mL OMNIPAQUE IOHEXOL 300 MG/ML  SOLN COMPARISON:  Abdominopelvic CT 02/25/2019 05/11/2014 FINDINGS: Lower chest: No focal airspace disease. Trace left pleural thickening without frank effusion. Borderline cardiomegaly. No basilar rib fracture. Previous right middle lobe pulmonary nodules not included in the field of view. Hepatobiliary: No focal hepatic abnormality or hepatic injury. No perihepatic hematoma. Gallbladder physiologically distended, no calcified stone. No biliary dilatation. Pancreas: No ductal dilatation or inflammation. Spleen: Normal in size without focal abnormality. No evidence of splenic injury. Adrenals/Urinary Tract: Normal adrenal glands. No hydronephrosis. The enhancing left renal lesion in the upper pole is less well visualized on the current exam, however appears grossly unchanged in size measuring approximately 16 mm, series 5, image 134. Additional simple cyst in the lower left kidney is again seen. No hydronephrosis. No evidence of renal injury. Symmetric excretion on delayed phase imaging. Urinary bladder is  distended without wall thickening. Stomach/Bowel: Ingested material in the stomach. No bowel wall thickening or evidence of injury. No mesenteric hematoma. There is fecalization of distal small bowel contents. Normal appendix. Large  volume of stool throughout the colon with colonic tortuosity. No mesenteric hematoma. Vascular/Lymphatic: Mild aortic atherosclerosis. No evidence of aortic injury. IVC is intact. No retroperitoneal fluid. No retroperitoneal stranding or evidence of retroperitoneal bleed. No adenopathy. Reproductive: Uterus is unremarkable. No suspicious adnexal mass. Slight right ovarian prominence is unchanged from prior. Other: No free fluid or evidence intra-abdominal bleed. No retroperitoneal hemorrhage. No free air. Minimal patchy subcutaneous edema of the right posterior flank without confluent hematoma. Musculoskeletal: Spinal stimulator in place with battery pack in the right anterior abdominal wall. Minimal cortical buckling of the mid sacrum may represent a nondisplaced fracture, series 5, image 100. No other fracture of the lumbar spine or pelvis. Multilevel degenerative change throughout the lumbar spine. IMPRESSION: 1. Minimal cortical buckling of the mid sacrum may represent a nondisplaced fracture. 2. Minimal patchy subcutaneous edema of the right posterior flank without confluent hematoma. No evidence of intra-abdominal or retroperitoneal bleed. No evidence of acute intra-abdominal/pelvic injury. 3. Enhancing left renal lesion measuring 16 mm, unchanged from abdominal CT 02/25/2019. 4. Large volume of stool throughout the colon with colonic tortuosity in fecalization of distal small bowel contents consistent with constipation and slow bowel transit. Aortic Atherosclerosis (ICD10-I70.0). Electronically Signed   By: Keith Rake M.D.   On: 08/04/2019 04:06    EKG: Independently reviewed.  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 B leg weakness and pain - 1. Some of pain could be due to the questionable non-displaced sacral fracture 2. However the timing between the fall and worsening symptoms seems definitely off (injury was 3 weeks ago, symptoms didn't really worsen until yesterday). 3. Per neuro note, need MRI of C/T/L spine to r/o MS flare, will get this ordered 1. Has baclofen pump, but has had multiple MRIs since pump last replaced in 2015.  Just need to call [employer] to come and deactivate and reactivate pump apparently. 4. PRN fentanyl for severe pain, continue home PRN norco for moderate pain 2. MS - 1. No steroids unless acute MS found on MRI as per neurologists note. 3. UTI - 1. UCx positive from 4 days ago for staph simulans 2. Will start doxycycline 3. Repeat UA pending today 4. A.Fib - 1. Continue rate control 2. Continue coumadin 5. HTN - 1. Cont home BP meds 6. Hypothyroidism - continue synthroid 7. Chronic constipation - 1. Continue home laxatives 8. OSA - continue BIPAP at night 9. DM2 - 1. On no chronic meds for this 2. Seems to be a chart diagnosis 3. Will put on carb mod diet 4. Will need to treat if she does end up getting steroids for an MS flare.  DVT prophylaxis: Coumadin Code Status: Full Family Communication: No family in room Disposition Plan: Home after admit Consults called: teleneuro Admission status: Place in 78    Jazira Maloney, [facility]  How to contact the [facility] Attending or Consulting provider [place]  or covering provider during after hours [place], for this patient?  1. Check the care team in [facility] and look for a) attending/consulting [facility] provider listed and b) the [facility] team listed 2. Log into [email or web address]  Amion Physician Scheduling and messaging for groups and whole hospitals  On call and physician scheduling software  for group practices, residents, hospitalists and other medical providers for call, clinic, rotation and shift schedules. On[employer] is a hospital-wide system for scheduling doctors and paging doctors on call. EasyPlot is for scientific plotting and data analysis.  [email or web address]  and use [place]'s universal password to access. If you do not have the password, please contact the hospital operator.  3. Locate the [facility] provider you are looking for under [facility] and page to a number that you can be directly reached. 4. If you still have difficulty reaching the provider, please page the [facility] (Director on Call) for the Hospitalists listed on amion for assistance.  08/04/2019, 5:33 AM

## 2019-08-04 NOTE — ED Notes (Signed)
Per, [employer] rep someone will be here in a couple of hours to check baclofen pump

## 2019-08-04 NOTE — ED Notes (Signed)
Pt moved in system by accident, disregard.

## 2019-08-04 NOTE — Consult Note (Signed)
[facility] TeleNeurology Consult Services   STAT Neurology Consult   Chief Complaint: Worsening buttock pain and leg pain   HPI: Asked to see this patient in emergent telemedicine consultation utilizing interactive audio and video technologies. Consultation was performed with assistance of ancillary / medical staff at bedside. Verbal consent to perform the examination with telemedicine was obtained. Patient agreed to proceed with the consultation.  73 year old right-handed white female who was brought to the emergency room with worsening buttock and lower extremity pain.  Patient is on Coumadin for a history of paroxysmal atrial fibrillation.  She states she has had multiple sclerosis since 1989 and has a baclofen pump.  She states she is mostly wheelchair-bound and can transfer.  Patient states she can have MRI imaging with her baclofen pump.  It appears she is not on any disease modifying therapies for her multiple sclerosis.  She does have chronic right leg weakness.  Patient states that about 3 weeks ago, she had a fall and landed on her buttock.  She came to the emergency room on Thursday, 12/10 for her continued right-sided buttock pain.  She had x-rays that were negative and she was discharged home.  Patient states that today, she noted that her right-sided buttock pain had now started to radiate to the left side.  She also developed burning type pain in her buttocks and bilateral thighs.  She also had pain in her hips.  She felt like her right leg was worse.  She also felt like her legs were weaker and she was having trouble transferring herself.  Her husband had to help her, which he has never had to do before.  Patient decided to come to the emergency room due to her pain and persistent symptoms.  She does feel better after getting pain medications this evening.   PMH: Multiple sclerosis since 1989 with chronic gait abnormality and baclofen pump; hypertension; hyperlipidemia; paroxysmal  atrial fibrillation; Hypothyroidism; depression/anxiety; history of DVT; obstructive sleep apnea; migraine headaches; and prior cervical spine fusion surgery    SOC: Negative x3.  Patient smoked before in the past.  Patient is married and lives with her family.  Patient is wheelchair-bound.   [place]: Brother with a history of atrial fibrillation.  Mother might of had a stroke before.  Family history also significant for heart disease, cancer, and depression.   ROS: 13 point review systems were reviewed with the patient, and are all negative with the exception of the aforementioned in the history of present illness.   VS: Temperature 98.4 F, pulse 100, respiration 18, blood pressure 145/90, oxygen saturation 99%   Exam: Patient is in no apparent distress.  Patient appears as stated age.  No obvious acute respiratory or cardiac distress.  Patient is well groomed and well-nourished. 1a- LOC: Keenly responsive - 0 1b- LOC questions: Answers both questions correctly - 0 1c- LOC commands- Performs both tasks correctly- 0 2- Gaze: Normal; no gaze paresis or gaze deviation - 0 3- Visual Fields: normal, no Visual field deficit - 0 4- Facial movements: no facial palsy - 0 5- Upper limb motor - no arm drift - 0 6- Lower limb motor - right leg drift - 1 7- Limb Coordination: absent ataxia - 0 8- Sensory: no sensory loss - 0 9- Language - No aphasia - 0 10- Speech - No dysarthria -0 11- Neglect / Extinction - none found - 0 NIHSS score: 1   Diagnostic Data: WBC 12.8, hemoglobin 14.2, platelets 228, PT 31.4, INR  3  Medical Data Reviewed: 1.Data?reviewed include clinical labs, radiology,?and medical tests; 2.Tests?results discussed w/performing or interpreting physician; 3.Obtaining/reviewing old medical records; 4.Obtaining?case history from another source; 5.Independent?review of image, tracing, or specimen.   Medical Decision Making: - Extensive number of diagnosis or management options are  considered below. - Extensive amount of complex data reviewed. - High risk of complication and/or morbidity or mortality are associated with differential diagnostic considerations below. - There may be?uncertain?outcome and increased probability of prolonged functional impairment or high probability of severe prolonged functional impairment associated with some of these differential diagnosis.    Assessment: 1.  Worsening buttock and leg pain with lower extremity weakness 2.  History of multiple sclerosis since 1989 with baclofen pump and being wheelchair-bound 3.  Hypertension 4.  Hyperlipidemia 5.  Paroxysmal atrial fibrillation on Coumadin 6.  Hypothyroidism 7.  Depression/anxiety 8.  Prior cervical spine fusion surgery 9.  Migraine headache   Recommendations: Can check CT of the abdomen pelvis to rule out any odd hematoma that could be compressing her lumbosacral plexus given that the patient is on Coumadin  Patient can be admitted to the hospital for further work-up of her symptoms Consult inpatient neurology team to assist with evaluation and management Check MRI lumbar spine with and without contrast to rule out any acute lumbar spine process or inflammatory process Can also check MRI cervical spine and thoracic spine with and without contrast to rule out any acute spinal cord process or inflammatory process Would not start the patient on any IV steroids unless MRI imaging shows active demyelination Metabolic and infectious work-up per primary team.  Will need to rule out any underlying subacute infectious process that be causing a pseudo-MS exacerbation Consult PT and OT Continue supportive care Plan of care was discussed with the patient  Thank you for allowing [facility] to participate in the care of your patient. Please call me, Dr. Dale [place], with any questions at [phone]. Case discussed with the ER staff and Dr. Christy Gentles.

## 2019-08-04 NOTE — ED Notes (Signed)
Patient transported to MRI 

## 2019-08-04 NOTE — ED Notes (Signed)
[employer] rep contacted to turn on baclofen pump.

## 2019-08-04 NOTE — ED Notes (Signed)
Hospitalist at bedside 

## 2019-08-05 DIAGNOSIS — E119 Type 2 diabetes mellitus without complications: Secondary | ICD-10-CM | POA: Diagnosis present

## 2019-08-05 DIAGNOSIS — G4733 Obstructive sleep apnea (adult) (pediatric): Secondary | ICD-10-CM | POA: Diagnosis present

## 2019-08-05 DIAGNOSIS — Z79899 Other long term (current) drug therapy: Secondary | ICD-10-CM | POA: Diagnosis not present

## 2019-08-05 DIAGNOSIS — Z993 Dependence on wheelchair: Secondary | ICD-10-CM | POA: Diagnosis not present

## 2019-08-05 DIAGNOSIS — R29898 Other symptoms and signs involving the musculoskeletal system: Secondary | ICD-10-CM | POA: Diagnosis not present

## 2019-08-05 DIAGNOSIS — Z8349 Family history of other endocrine, nutritional and metabolic diseases: Secondary | ICD-10-CM | POA: Diagnosis not present

## 2019-08-05 DIAGNOSIS — K5900 Constipation, unspecified: Secondary | ICD-10-CM | POA: Diagnosis not present

## 2019-08-05 DIAGNOSIS — E039 Hypothyroidism, unspecified: Secondary | ICD-10-CM | POA: Diagnosis present

## 2019-08-05 DIAGNOSIS — Z87891 Personal history of nicotine dependence: Secondary | ICD-10-CM | POA: Diagnosis not present

## 2019-08-05 DIAGNOSIS — I4821 Permanent atrial fibrillation: Secondary | ICD-10-CM | POA: Diagnosis present

## 2019-08-05 DIAGNOSIS — G35 Multiple sclerosis: Secondary | ICD-10-CM | POA: Diagnosis present

## 2019-08-05 DIAGNOSIS — Z8249 Family history of ischemic heart disease and other diseases of the circulatory system: Secondary | ICD-10-CM | POA: Diagnosis not present

## 2019-08-05 DIAGNOSIS — Z981 Arthrodesis status: Secondary | ICD-10-CM | POA: Diagnosis not present

## 2019-08-05 DIAGNOSIS — Z7901 Long term (current) use of anticoagulants: Secondary | ICD-10-CM | POA: Diagnosis not present

## 2019-08-05 DIAGNOSIS — W19XXXA Unspecified fall, initial encounter: Secondary | ICD-10-CM | POA: Diagnosis present

## 2019-08-05 DIAGNOSIS — K5909 Other constipation: Secondary | ICD-10-CM | POA: Diagnosis present

## 2019-08-05 DIAGNOSIS — B957 Other staphylococcus as the cause of diseases classified elsewhere: Secondary | ICD-10-CM | POA: Diagnosis present

## 2019-08-05 DIAGNOSIS — Z1623 Resistance to quinolones and fluoroquinolones: Secondary | ICD-10-CM | POA: Diagnosis present

## 2019-08-05 DIAGNOSIS — E785 Hyperlipidemia, unspecified: Secondary | ICD-10-CM | POA: Diagnosis present

## 2019-08-05 DIAGNOSIS — Z20828 Contact with and (suspected) exposure to other viral communicable diseases: Secondary | ICD-10-CM | POA: Diagnosis present

## 2019-08-05 DIAGNOSIS — N39490 Overflow incontinence: Secondary | ICD-10-CM | POA: Diagnosis present

## 2019-08-05 DIAGNOSIS — R531 Weakness: Secondary | ICD-10-CM | POA: Diagnosis not present

## 2019-08-05 DIAGNOSIS — K219 Gastro-esophageal reflux disease without esophagitis: Secondary | ICD-10-CM | POA: Diagnosis present

## 2019-08-05 DIAGNOSIS — N390 Urinary tract infection, site not specified: Secondary | ICD-10-CM | POA: Diagnosis present

## 2019-08-05 DIAGNOSIS — S3210XA Unspecified fracture of sacrum, initial encounter for closed fracture: Secondary | ICD-10-CM | POA: Diagnosis present

## 2019-08-05 DIAGNOSIS — E079 Disorder of thyroid, unspecified: Secondary | ICD-10-CM

## 2019-08-05 DIAGNOSIS — Z9689 Presence of other specified functional implants: Secondary | ICD-10-CM | POA: Diagnosis present

## 2019-08-05 DIAGNOSIS — I10 Essential (primary) hypertension: Secondary | ICD-10-CM | POA: Diagnosis present

## 2019-08-05 DIAGNOSIS — Z86718 Personal history of other venous thrombosis and embolism: Secondary | ICD-10-CM | POA: Diagnosis not present

## 2019-08-05 LAB — CBC WITH DIFFERENTIAL/PLATELET
Abs Immature Granulocytes: 0.03 10*3/uL (ref 0.00–0.07)
Basophils Absolute: 0 10*3/uL (ref 0.0–0.1)
Basophils Relative: 0 %
Eosinophils Absolute: 0.2 10*3/uL (ref 0.0–0.5)
Eosinophils Relative: 2 %
HCT: 42.2 % (ref 36.0–46.0)
Hemoglobin: 13.6 g/dL (ref 12.0–15.0)
Immature Granulocytes: 0 %
Lymphocytes Relative: 25 %
Lymphs Abs: 2.5 10*3/uL (ref 0.7–4.0)
MCH: 31.2 pg (ref 26.0–34.0)
MCHC: 32.2 g/dL (ref 30.0–36.0)
MCV: 96.8 fL (ref 80.0–100.0)
Monocytes Absolute: 1.4 10*3/uL — ABNORMAL HIGH (ref 0.1–1.0)
Monocytes Relative: 15 %
Neutro Abs: 5.6 10*3/uL (ref 1.7–7.7)
Neutrophils Relative %: 58 %
Platelets: 186 10*3/uL (ref 150–400)
RBC: 4.36 MIL/uL (ref 3.87–5.11)
RDW: 13 % (ref 11.5–15.5)
WBC: 9.8 10*3/uL (ref 4.0–10.5)
nRBC: 0 % (ref 0.0–0.2)

## 2019-08-05 LAB — URINE CULTURE

## 2019-08-05 LAB — BASIC METABOLIC PANEL
Anion gap: 9 (ref 5–15)
BUN: 19 mg/dL (ref 8–23)
CO2: 30 mmol/L (ref 22–32)
Calcium: 8.3 mg/dL — ABNORMAL LOW (ref 8.9–10.3)
Chloride: 101 mmol/L (ref 98–111)
Creatinine, Ser: 0.52 mg/dL (ref 0.44–1.00)
GFR calc Af Amer: >60 mL/min (ref >60)
GFR calc non Af Amer: >60 mL/min (ref >60)
Glucose, Bld: 79 mg/dL (ref 70–99)
Potassium: 3.6 mmol/L (ref 3.5–5.1)
Sodium: 140 mmol/L (ref 135–145)

## 2019-08-05 LAB — PROTIME-INR
INR: 2.7 — ABNORMAL HIGH (ref 0.8–1.2)
Prothrombin Time: 28.2 seconds — ABNORMAL HIGH (ref 11.4–15.2)

## 2019-08-05 MED ORDER — SODIUM CHLORIDE 0.9 % IV SOLN: INTRAVENOUS | Status: DC

## 2019-08-05 MED ORDER — MORPHINE SULFATE (PF) 2 MG/ML IV SOLN: 1.0000 mg | INTRAVENOUS | Status: DC | PRN

## 2019-08-05 MED ORDER — WARFARIN SODIUM 5 MG PO TABS
5.0000 mg | ORAL_TABLET | Freq: Once | ORAL | Status: AC
  Administered 2019-08-05: 5 mg via ORAL
  Filled 2019-08-05 (×2): qty 1

## 2019-08-05 MED ORDER — SORBITOL 70 % SOLN
30.0000 mL | Freq: Once | Status: DC
  Filled 2019-08-05: qty 30

## 2019-08-05 MED ORDER — SENNOSIDES-DOCUSATE SODIUM 8.6-50 MG PO TABS
1.0000 | ORAL_TABLET | Freq: Two times a day (BID) | ORAL | Status: DC
  Administered 2019-08-05 – 2019-08-08 (×6): 1 via ORAL
  Filled 2019-08-05 (×6): qty 1

## 2019-08-05 MED ORDER — WARFARIN - PHARMACIST DOSING INPATIENT: Freq: Every day | Status: DC

## 2019-08-05 MED ORDER — HYDROCODONE-ACETAMINOPHEN 5-325 MG PO TABS
1.0000 | ORAL_TABLET | ORAL | Status: DC | PRN
  Administered 2019-08-07 (×2): 1 via ORAL
  Filled 2019-08-05 (×2): qty 1

## 2019-08-05 NOTE — Progress Notes (Signed)
ANTICOAGULATION CONSULT NOTE -   Pharmacy Consult for warfarin Indication: atrial fibrillation  Allergies  Allergen Reactions  . Hydrocodone Nausea And Vomiting    Hydrocodone causes vomiting  . Oxycodone Nausea And Vomiting    Oral oxycodone causes vomiting  . Chlorhexidine Gluconate Rash    Sores at site  . Zoloft [Sertraline] Hives, Swelling and Rash    Patient Measurements: Height: 5\' 3"  (160 cm) Weight: 170 lb (77.1 kg) IBW/kg (Calculated) : 52.4   Vital Signs: BP: 110/76 (12/15 0700) Pulse Rate: 62 (12/15 0700)  Labs: Recent Labs    08/04/19 0116 08/05/19 0547  HGB 14.2 13.6  HCT 43.5 42.2  PLT 228 186  LABPROT 31.4* 28.2*  INR 3.0* 2.7*  CREATININE 0.68 0.52    Estimated Creatinine Clearance: 62.5 mL/min (by C-G formula based on SCr of 0.52 mg/dL).   Medical History: Past Medical History:  Diagnosis Date  . Abnormality of gait 03/01/2015  . Anxiety   . Arthritis    "knees" (01/19/2016)  . Atrial fibrillation with RVR ([place]) 01/19/2016  . Back pain   . Chest pain   . Childhood asthma   . Chronic pain    "nerve pain from the MS"  . Constipation   . Depression   . DVT (deep venous thrombosis) ([place]) 1983   "RLE; may have just been phlebitis; it was before the age of dopplers"  . 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 GERD (gastroesophageal reflux disease)   . History of stress test 06/21/10   limited exam with some degree of breast attenuatin of the apex however a component of apical ischemia is not excluded, EF 62%; cardiac cath   . HLD (hyperlipidemia)   . Hx of echocardiogram 04/22/10   EF 55-60%, no valve issues  . Hypertension   . Hypothyroidism   . Joint pain   . Migraine    "none since I stopped beta blocker in 1990s" (01/19/2016)  . Multiple sclerosis ([place])    has had this may 1989-[place] reg doc  . OSA on CPAP   . Osteoarthritis   . PAF (paroxysmal atrial fibrillation) ([place])    on  coumadin; documented on monitor 06/2010  . Pneumonia 11/2011  . Shortness of breath   . Thyroid disease     Medications:  Scheduled:  . baclofen  40 mg Intrathecal Once  . buPROPion  300 mg Oral Daily  . cholecalciferol  2,000 Units Oral BID  . diltiazem  180 mg Oral Daily  . docusate sodium  100 mg Oral Daily  . doxycycline  100 mg Oral Q12H  . famotidine  20 mg Oral QHS  . furosemide  20 mg Oral BID  . gabapentin  300 mg Oral QID  . levothyroxine  75 mcg Oral Daily  . nebivolol  5 mg Oral Daily  . omega-3 acid ethyl esters  1,000 mg Oral Daily  . pantoprazole  40 mg Oral Daily  . psyllium  1 packet Oral Daily  . simvastatin  10 mg Oral QPM  . tamsulosin  0.4 mg Oral Daily   Infusions:  . cefTRIAXone (ROCEPHIN)  IV Stopped (08/04/19 1434)    Assessment: 45 yoF with extremity weakness on chronic warfarin for a-fib.  HD 5 mg daily, 2.5 mg Fridays. LD = 12/13 2200,   08/05/2019 INR 2.7 H/H & Plts WNL DI: doxycycline: Hypoprothrombinemic effects of warfarin may be increased  Goal of Therapy:  INR 2-3    Plan:  Warfarin 5mg  po x1 today Daily PT/INR  Dolly Rias RPh 08/05/2019, 7:56 AM

## 2019-08-05 NOTE — Progress Notes (Signed)
PROGRESS NOTE    Toni [name]  [id number] DOB: 1946-07-07 DOA: 08/03/2019 PCP: Prince Solian, MD    Brief Narrative:  HPI per Dr. Sonia Side is a 73 y.o. female with medical history significant of MS diagnosed in 1989, HTN, A.Fib on coumadin.  At baseline she is mostly wheelchair bound but can transfer.  Looks like she takes Acthar as needed for MS flares but not on any chronic disease modifying therapies.  ~3 weeks ago, patient had fall, landed on buttock.  Came to ED on Thurs for continued R sided buttock pain.  X ray is ? For non-displaced fracture, she was discharged home.  Today noticed that her R sided buttock pain started to radiate to L side.  B legs seemed weaker and having trouble transfering herself, normally able to transfer independently.  Presents to ED.  ED Course: Feeling better after pain meds.  CT abd/pelvis shows no obvious hematoma, does show questionable non-displaced sacral fracture.  Of note her UA from 12/10 has come back growing staph simulans.  Tele-neuro consulted (see their extensive note).  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  CT abdomen and pelvis done concerning for nondisplaced sacral fracture.  Due to timing between following worsening symptoms there was some concern that patient's pain likely not necessarily secondary to fall.  Patient was seen by telemetry  neurology who recommended MRI of the C/T/L-spine to rule out MS flare which was done.  MRI which was done showed no definite new cord lesion or abnormal enhancement with suboptimal evaluation due to artifact.  Postop and degenerative changes noted.  Urinalysis done concerning for UTI and as such patient started on doxycycline.  Urine cultures were not obtained prior to starting of oral doxy and repeat urine cultures obtained with multiple species.  Urine cultures from 07/31/2019 which was done was positive for Staphylococcus simulans.  Continue doxycycline.  IV fluids, supportive care.  Case discussed with neurology Dr.Aroor who reviewed MRI films and felt no sign of MS flare and recommended treatment for infection, pain management.  PT/OT.  Will discontinue IV Rocephin.  Follow.  2.  UTI Urine cultures from 07/31/2019 when patient had presented to the ED positive for greater than 100,000 colonies of Staphylococcus simulans.  Sensitivities resistant to ciprofloxacin and oxacillin and sensitive to tetracyclines, clindamycin, gentamicin, Bactrim and vancomycin.  Patient on doxycycline which we will continue for now.  Discontinue IV Rocephin.  Follow.  3.  Obstructive sleep apnea BiPAP nightly.  4.  Paroxysmal atrial fibrillation Continue Cardizem and Bystolic for rate control.  Coumadin for anticoagulation.  5.  MS No MS flare noted on MRI.  Per neurology's note and in discussion with neuro hospitalist no need for steroids at this time.  Outpatient follow-up.  6.  Hypertension Continue Cardizem and Bystolic, Flomax.  7.  Hypothyroidism Continue Synthroid.  8.  Chronic constipation Soapsuds enema x1.  Sorbitol p.o. x1.  Continue Metamucil.  Placed on Senokot-S twice  daily.  Follow.  9.  Gastroesophageal reflux disease PPI.    DVT prophylaxis: Coumadin Code Status: Full Family Communication: Updated patient.  Updated husband at bedside. Disposition Plan: Likely home when pain is  controlled, clinical improvement.   Consultants:   Telemetry neurology  Curb sided neuro hospitalist: Dr.Aroor 08/04/2019  Procedures:   MRI C/T/L-spine 08/04/2019  CT abdomen and pelvis 08/04/2019  Antimicrobials:   Doxycycline 08/04/2019  IV Rocephin 08/04/2019 >>>>> 08/05/2019   Subjective: Patient laying on gurney in the ED.  Patient states is having dysuria and increased urinary frequency.  Patient still with complaints of increasing weakness in lower extremities right greater than left.  No chest pain.  No shortness of breath.  Patient concerned she did not receive her Coumadin last night.  Objective: Vitals:   08/05/19 1245 08/05/19 1300 08/05/19 1315 08/05/19 1330  BP:  (!) 142/78  127/66  Pulse: 90 89 92 82  Resp: 14 13 (!) 8 11  Temp:      TempSrc:      SpO2: 93% 91% 92% 90%  Weight:      Height:        Intake/Output Summary (Last 24 hours) at 08/05/2019 1347 Last data filed at 08/04/2019 1434 Gross per 24 hour  Intake 100 ml  Output --  Net 100 ml   Filed Weights   08/03/19 2341  Weight: 77.1 kg    Examination:  General exam: NAD. Respiratory system: Clear to auscultation bilaterally anterior lung fields.  No wheezes, no crackles, no rhonchi. Cardiovascular system: Irregularly irregular.  No JVD.  No lower extremity edema.  Gastrointestinal system: Abdomen is nondistended, soft and nontender. No organomegaly or masses felt. Normal bowel sounds heard. Central nervous system: Alert and oriented. No focal neurological deficits. Extremities: 5/5 bilateral upper extremity strength.  3/5 right lower extremity strength.  4/5 left lower extremity strength.  Skin: No rashes, lesions or ulcers Psychiatry: Judgement and insight appear normal. Mood & affect appropriate.     Data Reviewed: I have personally reviewed following labs and imaging studies  CBC: Recent Labs  Lab 08/04/19 0116 08/05/19 0547  WBC 12.8* 9.8  NEUTROABS 9.1* 5.6  HGB 14.2  13.6  HCT 43.5 42.2  MCV 94.8 96.8  PLT 228 [id number]   Basic Metabolic Panel: Recent Labs  Lab 08/04/19 0116 08/05/19 0547  NA 140 140  K 4.0 3.6  CL 100 101  CO2 31 30  GLUCOSE 120* 79  BUN 23 19  CREATININE 0.68 0.52  CALCIUM 9.6 8.3*   GFR: Estimated Creatinine Clearance: 62.5 mL/min (by C-G formula based on SCr of 0.52 mg/dL). Liver Function Tests: No results for input(s): AST, ALT, ALKPHOS, BILITOT, PROT, ALBUMIN in the last 168 hours. No results for input(s): LIPASE, AMYLASE in the last 168 hours. No results for input(s): AMMONIA in the last 168 hours. Coagulation Profile: Recent Labs  Lab 08/04/19 0116 08/05/19 0547  INR 3.0* 2.7*   Cardiac Enzymes: No results for input(s): CKTOTAL, CKMB, CKMBINDEX, TROPONINI in the last 168 hours. BNP (last 3 results) No results for input(s): PROBNP in the last 8760 hours. HbA1C: No results for input(s): HGBA1C in the last 72 hours. CBG: No results for input(s): GLUCAP in the last 168 hours. Lipid Profile: No results for input(s): CHOL, HDL, LDLCALC, TRIG, CHOLHDL, LDLDIRECT in the last 72 hours. Thyroid Function Tests: No results for input(s): TSH, T4TOTAL, FREET4, T3FREE, THYROIDAB in the last 72 hours. Anemia Panel: No results for input(s): VITAMINB12, FOLATE, FERRITIN,  TIBC, IRON, RETICCTPCT in the last 72 hours. Sepsis Labs: No results for input(s): PROCALCITON, LATICACIDVEN in the last 168 hours.  Recent Results (from the past 240 hour(s))  Urine culture     Status: Abnormal   Collection Time: 07/31/19 10:28 PM   Specimen: Urine, Clean Catch  Result Value Ref Range Status   Specimen Description   Final    Urine Performed at [facility] [address] [place], [place] [address]    Special Requests   Final    NONE Performed at [facility], [place] [address], [place], [place] [address]    Culture >=100,000 COLONIES/mL STAPHYLOCOCCUS SIMULANS (A)  Final   Report Status  08/03/2019 FINAL  Final   Organism ID, Bacteria STAPHYLOCOCCUS SIMULANS (A)  Final      Susceptibility   Staphylococcus simulans - MIC*    CIPROFLOXACIN >=8 RESISTANT Resistant     ERYTHROMYCIN <=0.25 SENSITIVE Sensitive     GENTAMICIN <=0.5 SENSITIVE Sensitive     OXACILLIN 1 RESISTANT Resistant     TETRACYCLINE <=1 SENSITIVE Sensitive     VANCOMYCIN <=0.5 SENSITIVE Sensitive     TRIMETH/SULFA <=10 SENSITIVE Sensitive     CLINDAMYCIN <=0.25 SENSITIVE Sensitive     RIFAMPIN <=0.5 SENSITIVE Sensitive     Inducible Clindamycin NEGATIVE Sensitive     * >=100,000 COLONIES/mL STAPHYLOCOCCUS SIMULANS  SARS CORONAVIRUS 2 (TAT 6-24 HRS) Nasopharyngeal Nasopharyngeal Swab     Status: None   Collection Time: 08/04/19  3:24 AM   Specimen: Nasopharyngeal Swab  Result Value Ref Range Status   SARS Coronavirus 2 NEGATIVE NEGATIVE Final    Comment: (NOTE) SARS-CoV-2 target nucleic acids are NOT DETECTED. The SARS-CoV-2 RNA is generally detectable in upper and lower respiratory specimens during the acute phase of infection. Negative results do not preclude SARS-CoV-2 infection, do not rule out co-infections with other pathogens, and should not be used as the sole basis for treatment or other patient management decisions. Negative results must be combined with clinical observations, patient history, and epidemiological information. The expected result is Negative. Fact Sheet for Patients: SugarRoll.be Fact Sheet for Healthcare Providers: https://www.woods-mathews.com/ This test is not yet approved or cleared by the [place] FDA and  has been authorized for detection and/or diagnosis of SARS-CoV-2 by FDA under an Emergency Use Authorization (EUA). This EUA will remain  in effect (meaning this test can be used) for the duration of the COVID-19 declaration under Section 56 4(b)(1) of the Act, 21 U.S.C. section 360bbb-3(b)(1), unless the authorization is  terminated or revoked sooner. Performed at [facility], [place] [address] [place], [place] [address]          Radiology Studies: MR CERVICAL SPINE W WO CONTRAST  Result Date: 08/04/2019 CLINICAL DATA:  Increasing right leg weakness and numbness, right buttock pain; history of multiple sclerosis EXAM: MRI CERVICAL SPINE WITHOUT AND WITH CONTRAST TECHNIQUE: Multiplanar and multiecho pulse sequences of the cervical spine, to include the craniocervical junction and cervicothoracic junction, were obtained without and with intravenous contrast. CONTRAST:  26mL GADAVIST GADOBUTROL 1 MMOL/ML IV SOLN COMPARISON:  2011 FINDINGS: Alignment: Stable. Vertebrae: Stable vertebral body heights. There is anterior fusion from C3-C7 with plate and screw fixation and well incorporated interbody grafts. The hardware is not well evaluated on this study and there is associated susceptibility artifact. No significant marrow edema. Cord: Possible unchanged cord T2 hyperintensity on the right at C6-C7. No definite new abnormal signal with suboptimal evaluation due to  artifact. No abnormal enhancement. Posterior Fossa, vertebral arteries, paraspinal tissues: Unremarkable. Disc levels: Suboptimal evaluation due to artifact. C2-C3: Small central disc protrusion. Facet hypertrophy, left greater than right. Probable minor canal stenosis and no significant foraminal stenosis. C3-C4: Operative level. Bridging bone and endplate osteophytes. Probable mild canal stenosis and no significant foraminal stenosis. C4-C5: Operative level. Bridging bone and endplate osteophytes. Uncovertebral and facet hypertrophy. Probable mild canal stenosis and at least mild foraminal stenosis. C5-C6: Operative level. Bridging bone and endplate osteophytes. Facet and uncovertebral hypertrophy. Probable minor canal and foraminal stenosis. C6-C7: Operative level. Bridging bone and endplate osteophytes. No significant canal or foraminal stenosis. C7-T1:  Small disc extrusion extending slightly below the disc level. Facet hypertrophy. No significant canal or foraminal stenosis. IMPRESSION: No definite new cord lesion or abnormal enhancement with suboptimal evaluation due to artifact. Postoperative and degenerative changes as detailed above. Electronically Signed   By: Macy Mis M.D.   On: 08/04/2019 09:07   MR THORACIC SPINE W WO CONTRAST  Result Date: 08/04/2019 CLINICAL DATA:  Fall 3 weeks ago, increasing right leg weakness and numbness; history of multiple sclerosis EXAM: MRI THORACIC WITHOUT AND WITH CONTRAST TECHNIQUE: Multiplanar and multiecho pulse sequences of the thoracic spine were obtained without and with intravenous contrast. CONTRAST:  8mL GADAVIST GADOBUTROL 1 MMOL/ML IV SOLN COMPARISON:  None. FINDINGS: MRI THORACIC SPINE FINDINGS Alignment:  Anteroposterior alignment is maintained. Vertebrae: Vertebral body heights are preserved. There are several vertebral body hemangiomas with the largest involving the left T6 pedicle and the T10 vertebral body. Cord: Suboptimal evaluation due to motion artifact. Patchy foci of cord T2 hyperintensity likely reflecting chronic demyelination given history. Thoracic cord appears mildly atrophic. No abnormal enhancement. Paraspinal and other soft tissues: Unremarkable. Disc levels: Mild multilevel degenerative disc disease and facet arthropathy with ligamentum flavum thickening. For example, small central disc protrusion at T4-T5 and right paracentral disc extrusion extending slightly above disc level at T12-L1. There is no significant degenerative canal or neural foraminal stenosis. IMPRESSION: Patchy foci of cord T2 hyperintensity likely reflecting chronic demyelination given history. There is no definite abnormal enhancement. Mild degenerative changes. Electronically Signed   By: Macy Mis M.D.   On: 08/04/2019 09:48   MR Lumbar Spine W Wo Contrast  Result Date: 08/04/2019 CLINICAL DATA:  Fall 3  weeks ago, increasing right leg weakness and numbness; history of multiple sclerosis EXAM: MRI LUMBAR SPINE WITHOUT AND WITH CONTRAST TECHNIQUE: Multiplanar and multiecho pulse sequences of the lumbar spine were obtained without and with intravenous contrast. CONTRAST:  37mL GADAVIST GADOBUTROL 1 MMOL/ML IV SOLN COMPARISON:  Dec 28, 2016 FINDINGS: Segmentation: For the purposes of this dictation, the most caudal lumbar type vertebral body is designated L5. Alignment: There is grade 1 anterolisthesis at L4-L5 similar to the prior study. Vertebrae: Lumbar vertebral body heights are maintained apart from degenerative endplate irregularity and Schmorl's nodes. There is sacral marrow edema particularly at S2 and S3 as well as within the imaged iliac bones bilaterally. Conus medullaris and cauda equina: Conus extends to the L1 level. Conus and cauda equina appear normal. Paraspinal and other soft tissues: Presacral fluid is noted. There is mild soft tissue edema surrounding the sacrum. Disc levels: L1-L2: Very small left central disc protrusion. No significant canal or foraminal stenosis. Appearance is similar. L2-L3: Disc bulge with small left central disc protrusion. Mild facet arthropathy with ligamentum flavum infolding. No significant canal stenosis. Mild right and minor left foraminal stenosis. Appearance is similar. L3-L4: Disc bulge and marked  facet arthropathy with ligamentum flavum infolding. Similar mild to moderate canal stenosis with partial effacement of the lateral recesses. Slightly increased mild right foraminal stenosis. Similar mild left foraminal stenosis. L4-L5: Anterolisthesis with uncovering of disc bulge. Marked facet arthropathy with ligamentum flavum infolding. Similar moderate canal stenosis with narrowing of the lateral recesses. Similar moderate right and moderate to marked left foraminal stenosis. L5-S1: Disc bulge and moderate facet arthropathy. No significant canal stenosis. Similar mild,  right greater than left foraminal stenosis. IMPRESSION: Partially imaged sacral and bilateral iliac marrow edema. This is probably post-traumatic, noting possible fracture on prior imaging. Multilevel degenerative changes as detailed above. Appearance is similar to the 2018 study. Slightly increased right foraminal stenosis at L3-L4. Electronically Signed   By: Macy Mis M.D.   On: 08/04/2019 09:41   CT ABDOMEN PELVIS W CONTRAST  Result Date: 08/04/2019 CLINICAL DATA:  Fall 3 weeks ago. Persistent right buttock pain. Pain radiates to both hips. Evaluate for intra-abdominal/retroperitoneal hematoma. On anticoagulation. EXAM: CT ABDOMEN AND PELVIS WITH CONTRAST TECHNIQUE: Multidetector CT imaging of the abdomen and pelvis was performed using the standard protocol following bolus administration of intravenous contrast. CONTRAST:  135mL OMNIPAQUE IOHEXOL 300 MG/ML  SOLN COMPARISON:  Abdominopelvic CT 02/25/2019 05/11/2014 FINDINGS: Lower chest: No focal airspace disease. Trace left pleural thickening without frank effusion. Borderline cardiomegaly. No basilar rib fracture. Previous right middle lobe pulmonary nodules not included in the field of view. Hepatobiliary: No focal hepatic abnormality or hepatic injury. No perihepatic hematoma. Gallbladder physiologically distended, no calcified stone. No biliary dilatation. Pancreas: No ductal dilatation or inflammation. Spleen: Normal in size without focal abnormality. No evidence of splenic injury. Adrenals/Urinary Tract: Normal adrenal glands. No hydronephrosis. The enhancing left renal lesion in the upper pole is less well visualized on the current exam, however appears grossly unchanged in size measuring approximately 16 mm, series 5, image 134. Additional simple cyst in the lower left kidney is again seen. No hydronephrosis. No evidence of renal injury. Symmetric excretion on delayed phase imaging. Urinary bladder is distended without wall thickening.  Stomach/Bowel: Ingested material in the stomach. No bowel wall thickening or evidence of injury. No mesenteric hematoma. There is fecalization of distal small bowel contents. Normal appendix. Large volume of stool throughout the colon with colonic tortuosity. No mesenteric hematoma. Vascular/Lymphatic: Mild aortic atherosclerosis. No evidence of aortic injury. IVC is intact. No retroperitoneal fluid. No retroperitoneal stranding or evidence of retroperitoneal bleed. No adenopathy. Reproductive: Uterus is unremarkable. No suspicious adnexal mass. Slight right ovarian prominence is unchanged from prior. Other: No free fluid or evidence intra-abdominal bleed. No retroperitoneal hemorrhage. No free air. Minimal patchy subcutaneous edema of the right posterior flank without confluent hematoma. Musculoskeletal: Spinal stimulator in place with battery pack in the right anterior abdominal wall. Minimal cortical buckling of the mid sacrum may represent a nondisplaced fracture, series 5, image 100. No other fracture of the lumbar spine or pelvis. Multilevel degenerative change throughout the lumbar spine. IMPRESSION: 1. Minimal cortical buckling of the mid sacrum may represent a nondisplaced fracture. 2. Minimal patchy subcutaneous edema of the right posterior flank without confluent hematoma. No evidence of intra-abdominal or retroperitoneal bleed. No evidence of acute intra-abdominal/pelvic injury. 3. Enhancing left renal lesion measuring 16 mm, unchanged from abdominal CT 02/25/2019. 4. Large volume of stool throughout the colon with colonic tortuosity in fecalization of distal small bowel contents consistent with constipation and slow bowel transit. Aortic Atherosclerosis (ICD10-I70.0). Electronically Signed   By: Keith Rake M.D.   On:  08/04/2019 04:06        Scheduled Meds: . baclofen  40 mg Intrathecal Once  . buPROPion  300 mg Oral Daily  . cholecalciferol  2,000 Units Oral BID  . diltiazem  180 mg  Oral Daily  . docusate sodium  100 mg Oral Daily  . doxycycline  100 mg Oral Q12H  . famotidine  20 mg Oral QHS  . furosemide  20 mg Oral BID  . gabapentin  300 mg Oral QID  . levothyroxine  75 mcg Oral Daily  . nebivolol  5 mg Oral Daily  . omega-3 acid ethyl esters  1,000 mg Oral Daily  . pantoprazole  40 mg Oral Daily  . psyllium  1 packet Oral Daily  . simvastatin  10 mg Oral QPM  . tamsulosin  0.4 mg Oral Daily  . warfarin  5 mg Oral ONCE-1800  . Warfarin - Pharmacist Dosing Inpatient   Does not apply q1800   Continuous Infusions: . cefTRIAXone (ROCEPHIN)  IV Stopped (08/04/19 1434)     LOS: 0 days    Time spent: 35 minutes    Irine Seal, MD [facility]  If 7PM-7AM, please contact night-coverage [email or web address] 08/05/2019, 1:47 PM

## 2019-08-05 NOTE — Evaluation (Addendum)
Physical Therapy Evaluation Patient Details Name: Toni [name] MRN: [id number] DOB: [birth year] Today's Date: 08/05/2019   History of Present Illness  73 y.o. female with medical history significant of MS diagnosed in 1989, HTN, A.Fib on coumadin. Pt presented to the ED after a fall on her buttocks 3 weeks prior to admission coming in with worsening right-sided buttock pain with radiation to the left side with increased lower extremity weakness and difficulty transferring.   Clinical Impression  Pt admitted with above diagnosis. Pt requires +2 max to total assist for bed mobility and sit to partial stand. She was unable to come to a full stand (buttocks did not fully clear bed) with +2 assist and use of rolling walker. ST-SNF recommended.  Pt currently with functional limitations due to the deficits listed below (see PT Problem List). Pt will benefit from skilled PT to increase their independence and safety with mobility to allow discharge to the venue listed below.       Follow Up Recommendations SNF;Supervision/Assistance - 24 hour;Supervision for mobility/OOB    Equipment Recommendations  None recommended by PT    Recommendations for Other Services       Precautions / Restrictions Precautions Precautions: Fall Precaution Comments: pt fell 3 weeks ago Restrictions Weight Bearing Restrictions: No      Mobility  Bed Mobility Overal bed mobility: Needs Assistance Bed Mobility: Rolling;Sidelying to Sit;Sit to Supine Rolling: +2 for physical assistance;Max assist Sidelying to sit: +2 for physical assistance;Total assist   Sit to supine: +2 for safety/equipment;Total assist   General bed mobility comments: assist to raise trunk and pivot hips to edge of bed  Transfers Overall transfer level: Needs assistance Equipment used: Rolling walker (2 wheeled) Transfers: Sit to/from Stand Sit to Stand: +2 physical assistance;Total assist         General transfer comment: pt  unable to come to full stand, couldn't clear buttocks from bed with +2 total assist. Pt performed lateral scoot x 3 towards head of bed with +2 max assist.  Ambulation/Gait                Stairs            Wheelchair Mobility    Modified Rankin (Stroke Patients Only)       Balance Overall balance assessment: Needs assistance Sitting-balance support: Feet supported;No upper extremity supported Sitting balance-Leahy Scale: Fair     Standing balance support: Bilateral upper extremity supported Standing balance-Leahy Scale: Zero Standing balance comment: unable to come to full stand                             Pertinent Vitals/Pain Pain Assessment: 0-10 Pain Score: 8  Pain Location: BLEs Pain Descriptors / Indicators: Sore Pain Intervention(s): Limited activity within patient's tolerance;Monitored during session;Repositioned    Home Living Family/patient expects to be discharged to:: Private residence Living Arrangements: Spouse/significant other Available Help at Discharge: Family;Available 24 hours/day           Home Equipment: Wheelchair - manual;Shower seat;Grab bars - tub/shower      Prior Function Level of Independence: Needs assistance   Gait / Transfers Assistance Needed: pt had HHPT and was walking short distances 2 weeks ago, PTA she required assist from her husband for transfers           Hand Dominance        Extremity/Trunk Assessment   Upper Extremity Assessment Upper Extremity Assessment: Generalized weakness  Lower Extremity Assessment Lower Extremity Assessment: Generalized weakness;RLE deficits/detail RLE Deficits / Details: knee ext +3/5, hip flexion 2/5 RLE Coordination: WNL    Cervical / Trunk Assessment Cervical / Trunk Assessment: Normal  Communication   Communication: No difficulties  Cognition Arousal/Alertness: Awake/alert Behavior During Therapy: WFL for tasks assessed/performed Overall  Cognitive Status: Within Functional Limits for tasks assessed                                        General Comments      Exercises     Assessment/Plan    PT Assessment Patient needs continued PT services  PT Problem List Decreased mobility;Decreased strength;Decreased activity tolerance;Decreased balance;Pain       PT Treatment Interventions Therapeutic activities;Therapeutic exercise;Functional mobility training;Patient/family education    PT Goals (Current goals can be found in the Care Plan section)  Acute Rehab PT Goals Patient Stated Goal: to be able to perform WC transfers PT Goal Formulation: With patient/family Time For Goal Achievement: 08/19/19 Potential to Achieve Goals: Fair    Frequency Min 2X/week   Barriers to discharge        Co-evaluation               AM-PAC PT "6 Clicks" Mobility  Outcome Measure Help needed turning from your back to your side while in a flat bed without using bedrails?: Total Help needed moving from lying on your back to sitting on the side of a flat bed without using bedrails?: Total Help needed moving to and from a bed to a chair (including a wheelchair)?: Total Help needed standing up from a chair using your arms (e.g., wheelchair or bedside chair)?: Total Help needed to walk in hospital room?: Total Help needed climbing 3-5 steps with a railing? : Total 6 Click Score: 6    End of Session Equipment Utilized During Treatment: Gait belt Activity Tolerance: Patient limited by fatigue;Patient limited by pain Patient left: in bed;with call bell/phone within reach;with family/visitor present Nurse Communication: Mobility status;Need for lift equipment PT Visit Diagnosis: History of falling (Z91.81);Difficulty in walking, not elsewhere classified (R26.2);Muscle weakness (generalized) (M62.81);Pain Pain - Right/Left: Right Pain - part of body: Leg    Time: [id number] PT Time Calculation (min) (ACUTE ONLY):  19 min   Charges:   PT Evaluation $PT Eval Moderate Complexity: 1 Mod          Philomena Doheny PT 08/05/2019  Acute Rehabilitation Services Pager [phone] Office [phone]

## 2019-08-06 ENCOUNTER — Ambulatory Visit: Payer: Medicare Other | Admitting: Orthopaedic Surgery

## 2019-08-06 ENCOUNTER — Inpatient Hospital Stay (HOSPITAL_COMMUNITY): Payer: Medicare Other

## 2019-08-06 ENCOUNTER — Telehealth: Payer: Self-pay | Admitting: Neurology

## 2019-08-06 LAB — BASIC METABOLIC PANEL
Anion gap: 8 (ref 5–15)
BUN: 22 mg/dL (ref 8–23)
CO2: 32 mmol/L (ref 22–32)
Calcium: 8.6 mg/dL — ABNORMAL LOW (ref 8.9–10.3)
Chloride: 100 mmol/L (ref 98–111)
Creatinine, Ser: 0.56 mg/dL (ref 0.44–1.00)
GFR calc Af Amer: >60 mL/min (ref >60)
GFR calc non Af Amer: >60 mL/min (ref >60)
Glucose, Bld: 93 mg/dL (ref 70–99)
Potassium: 3.9 mmol/L (ref 3.5–5.1)
Sodium: 140 mmol/L (ref 135–145)

## 2019-08-06 LAB — PROTIME-INR
INR: 2.7 — ABNORMAL HIGH (ref 0.8–1.2)
Prothrombin Time: 28.4 seconds — ABNORMAL HIGH (ref 11.4–15.2)

## 2019-08-06 LAB — CBC
HCT: 41.7 % (ref 36.0–46.0)
Hemoglobin: 13.5 g/dL (ref 12.0–15.0)
MCH: 31 pg (ref 26.0–34.0)
MCHC: 32.4 g/dL (ref 30.0–36.0)
MCV: 95.9 fL (ref 80.0–100.0)
Platelets: 190 10*3/uL (ref 150–400)
RBC: 4.35 MIL/uL (ref 3.87–5.11)
RDW: 12.9 % (ref 11.5–15.5)
WBC: 8.9 10*3/uL (ref 4.0–10.5)
nRBC: 0 % (ref 0.0–0.2)

## 2019-08-06 LAB — SARS CORONAVIRUS 2 (TAT 6-24 HRS): SARS Coronavirus 2: NEGATIVE

## 2019-08-06 MED ORDER — WARFARIN SODIUM 5 MG PO TABS
5.0000 mg | ORAL_TABLET | Freq: Once | ORAL | Status: AC
  Administered 2019-08-06: 5 mg via ORAL
  Filled 2019-08-06: qty 1

## 2019-08-06 NOTE — Progress Notes (Signed)
ANTICOAGULATION CONSULT NOTE -   Pharmacy Consult for warfarin Indication: atrial fibrillation  Allergies  Allergen Reactions  . Hydrocodone Nausea And Vomiting    Hydrocodone causes vomiting  . Oxycodone Nausea And Vomiting    Oral oxycodone causes vomiting  . Chlorhexidine Gluconate Rash    Sores at site  . Zoloft [Sertraline] Hives, Swelling and Rash    Patient Measurements: Height: 5\' 3"  (160 cm) Weight: 170 lb (77.1 kg) IBW/kg (Calculated) : 52.4   Vital Signs: Temp: 98.4 F (36.9 C) (12/16 0605) Temp Source: Oral (12/16 0605) BP: 99/58 (12/16 0856) Pulse Rate: 84 (12/16 0605)  Labs: Recent Labs    08/04/19 0116 08/05/19 0547 08/06/19 0540  HGB 14.2 13.6 13.5  HCT 43.5 42.2 41.7  PLT 228 186 190  LABPROT 31.4* 28.2* 28.4*  INR 3.0* 2.7* 2.7*  CREATININE 0.68 0.52 0.56    Estimated Creatinine Clearance: 62.5 mL/min (by C-G formula based on SCr of 0.56 mg/dL).   Medical History: Past Medical History:  Diagnosis Date  . Abnormality of gait 03/01/2015  . Anxiety   . Arthritis    "knees" (01/19/2016)  . Atrial fibrillation with RVR ([place]) 01/19/2016  . Back pain   . Chest pain   . Childhood asthma   . Chronic pain    "nerve pain from the MS"  . Constipation   . Depression   . DVT (deep venous thrombosis) ([place]) 1983   "RLE; may have just been phlebitis; it was before the age of dopplers"  . 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 GERD (gastroesophageal reflux disease)   . History of stress test 06/21/10   limited exam with some degree of breast attenuatin of the apex however a component of apical ischemia is not excluded, EF 62%; cardiac cath   . HLD (hyperlipidemia)   . Hx of echocardiogram 04/22/10   EF 55-60%, no valve issues  . Hypertension   . Hypothyroidism   . Joint pain   . Migraine    "none since I stopped beta blocker in 1990s" (01/19/2016)  . Multiple sclerosis ([place])    has had this may  1989-[place] reg doc  . OSA on CPAP   . Osteoarthritis   . PAF (paroxysmal atrial fibrillation) ([place])    on coumadin; documented on monitor 06/2010  . Pneumonia 11/2011  . Shortness of breath   . Thyroid disease     Medications:  Scheduled:  . buPROPion  300 mg Oral Daily  . cholecalciferol  2,000 Units Oral BID  . diltiazem  180 mg Oral Daily  . docusate sodium  100 mg Oral Daily  . doxycycline  100 mg Oral Q12H  . famotidine  20 mg Oral QHS  . furosemide  20 mg Oral BID  . gabapentin  300 mg Oral QID  . levothyroxine  75 mcg Oral Daily  . nebivolol  5 mg Oral Daily  . omega-3 acid ethyl esters  1,000 mg Oral Daily  . pantoprazole  40 mg Oral Daily  . psyllium  1 packet Oral Daily  . senna-docusate  1 tablet Oral BID  . simvastatin  10 mg Oral QPM  . sorbitol  30 mL Oral Once  . tamsulosin  0.4 mg Oral Daily  . Warfarin - Pharmacist Dosing Inpatient   Does not apply q1800    Assessment: 87 yoF with extremity weakness on chronic warfarin for a-fib.  Home dose: 5 mg daily, 2.5 mg Fridays. LD = 12/13 2200  08/06/2019  INR = 2.7 remains therapeutic  Hgb/Plt both WNL and stable  Patient eating 100% of meals  Drug interactions: Pt on doxycycline which may enhance the anticoagulant effect of warfarin.  Goal of Therapy:  INR 2-3   Plan:   Warfarin 5 mg PO this evening, continue home dose  INR daily while inpatient and on antibiotics  Pt will require more frequent INR monitoring while on antibiotics and at completion of ABX course.   Lenis Noon, PharmD 08/06/19 9:16 AM

## 2019-08-06 NOTE — Progress Notes (Signed)
PROGRESS NOTE    Toni [name]  [id number] DOB: Aug 12, 1946 DOA: 08/03/2019 PCP: Prince Solian, MD    Brief Narrative:  73 y.o.femalewith medical history significant ofMS diagnosed in 1989, HTN, A.Fib on coumadin.  At baseline she is mostly wheelchair bound but can transfer.  Looks like she takes Acthar as needed for MS flares but not on any chronic disease modifying therapies.  ~3 weeks ago, patient had fall, landed on buttock. Came to ED on Thurs for continued R sided buttock pain. X ray is ? For non-displaced fracture, she was discharged home.  Today noticed that her R sided buttock pain started to radiate to L side. B legs seemed weaker and having trouble transfering herself, normally able to transfer independently. Presents to ED.  ED Course:Feeling better after pain meds.  CT abd/pelvis shows no obvious hematoma, does show questionable non-displaced sacral fracture.  Of note her UA from 12/10 has come back growing staph simulans.  Tele-neuro consulted (see their extensive note). 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  CT abdomen and pelvis done concerning for nondisplaced sacral fracture.   -Due to timing between following worsening symptoms there was some concern that patient's pain likely not necessarily secondary to fall.   -Patient was seen by telemetry neurology who recommended MRI of the  C/T/L-spine to rule out MS flare which was done.  MRI which was done showed no definite new cord lesion or abnormal enhancement with suboptimal evaluation due to artifact.   -Postop and degenerative changes noted.   -Urinalysis done concerning for UTI and as such patient started on doxycycline.   -Urine cultures from 07/31/2019 which was done was positive for Staphylococcus simulans.  Continue doxycycline.   -Dr. [place] Silos discussed with neurology Dr.Aroor who reviewed MRI films and felt no sign of MS flare and recommended treatment for infection, pain management.   -PT/OT rec for SNF -Pt continues to have marked LE weakness. No brain MRI done thus far, thus have ordered brain MRI, pending  2.  UTI -Urine cultures from 07/31/2019 when patient had presented to the ED positive for greater than 100,000 colonies of Staphylococcus simulans.  Sensitivities resistant to ciprofloxacin and oxacillin and sensitive to tetracyclines, clindamycin, gentamicin, Bactrim and vancomycin.   -Patient on doxycycline which we will continue for now. -Afebrile  3.  Obstructive sleep apnea -Continue with BiPAP nightly.  4.  Paroxysmal atrial fibrillation -Continue Cardizem and Bystolic for rate control.   -Coumadin for anticoagulation.  5.  MS -No MS flare noted on MRI of spine.  -Brain MRI ordered, pending  6.  Hypertension -Continue Cardizem and Bystolic, Flomax. -Stable at this time  7.  Hypothyroidism -Continue Synthroid as tolerated  8.  Chronic constipation -Continue with cathartics as needed  9.  Gastroesophageal reflux disease -Continue on PPI. -Stable at this time  DVT prophylaxis: Coumadin Code Status: Full Family Communication: Pt in room, family not at bedside Disposition Plan: SNF, timing uncertain  Consultants:  Procedures:     Antimicrobials: Anti-infectives (From admission, onward)   Start     Dose/Rate Route Frequency Ordered Stop   08/04/19 1400  cefTRIAXone  (ROCEPHIN) 1 g in sodium chloride 0.9 % 100 mL IVPB  Status:  Discontinued     1 g 200 mL/hr over 30 Minutes Intravenous Every 24 hours 08/04/19 1330 08/05/19 1901   08/04/19 0730  doxycycline (VIBRA-TABS) tablet 100 mg     100 mg Oral Every 12 hours 08/04/19 0545         Subjective: Still complaining of LE weakness  Objective: Vitals:   08/06/19 0605 08/06/19 0856 08/06/19 1119 08/06/19 1419  BP: (!) 121/51 (!) 99/58 (!) 107/54 (!) 108/56  Pulse: 84  83 78  Resp:    15  Temp: 98.4 F (36.9 C)   98.6 F (37 C)  TempSrc: Oral   Oral  SpO2: 98%   99%  Weight:      Height:        Intake/Output Summary (Last 24 hours) at 08/06/2019 1612 Last data filed at 08/06/2019 1500 Gross per 24 hour  Intake 420 ml  Output 550 ml  Net -130 ml   Filed Weights   08/03/19 2341  Weight: 77.1 kg    Examination: General exam: Appears calm and comfortable  Respiratory system: Clear to auscultation. Respiratory effort normal. Cardiovascular system: S1 & S2 heard, Regular Gastrointestinal system: Abdomen is nondistended, soft and nontender. No organomegaly or masses felt. Normal bowel sounds heard. Central nervous system: Alert and oriented. No focal neurological deficits. Extremities: BLE weakness Skin: No rashes, lesions Psychiatry: Judgement and insight appear normal. Mood & affect appropriate.   Data Reviewed: I have personally reviewed following labs and imaging studies  CBC: Recent Labs  Lab 08/04/19 0116 08/05/19 0547 08/06/19 0540  WBC 12.8* 9.8 8.9  NEUTROABS 9.1* 5.6  --   HGB 14.2 13.6 13.5  HCT 43.5 42.2 41.7  MCV 94.8 96.8 95.9  PLT 228 186 [id number]   Basic Metabolic Panel: Recent Labs  Lab 08/04/19 0116 08/05/19 0547 08/06/19 0540  NA 140 140 140  K 4.0 3.6 3.9  CL 100 101 100  CO2 31 30 32  GLUCOSE 120* 79 93  BUN 23 19 22   CREATININE 0.68 0.52 0.56  CALCIUM 9.6 8.3* 8.6*   GFR: Estimated Creatinine Clearance: 62.5 mL/min (by C-G formula based on SCr  of 0.56 mg/dL). Liver Function Tests: No results for input(s): AST, ALT, ALKPHOS, BILITOT, PROT, ALBUMIN in the last 168 hours. No results for input(s): LIPASE, AMYLASE in the last 168 hours. No results for input(s): AMMONIA in the last 168 hours. Coagulation Profile: Recent Labs  Lab 08/04/19 0116 08/05/19 0547 08/06/19 0540  INR 3.0* 2.7* 2.7*   Cardiac Enzymes: No results for input(s): CKTOTAL, CKMB, CKMBINDEX, TROPONINI in the last 168 hours. BNP (last 3 results) No results for input(s): PROBNP in the last 8760 hours. HbA1C: No results for input(s): HGBA1C in the last 72 hours. CBG: No results for input(s): GLUCAP in the last 168 hours. Lipid Profile: No results for input(s): CHOL, HDL, LDLCALC, TRIG, CHOLHDL, LDLDIRECT in the last 72 hours. Thyroid Function Tests: No results for input(s): TSH, T4TOTAL, FREET4, T3FREE, THYROIDAB in the last 72 hours. Anemia Panel: No results for input(s): VITAMINB12, FOLATE, FERRITIN, TIBC, IRON, RETICCTPCT in the last 72 hours. Sepsis Labs: No results for input(s): PROCALCITON, LATICACIDVEN in the last 168 hours.  Recent Results (from the past 240 hour(s))  Urine culture  Status: Abnormal   Collection Time: 07/31/19 10:28 PM   Specimen: Urine, Clean Catch  Result Value Ref Range Status   Specimen Description   Final    Urine Performed at [facility] [address], [place], [place] [address]    Special Requests   Final    NONE Performed at [facility], [place] [address], [place], [place] [address]    Culture >=100,000 COLONIES/mL STAPHYLOCOCCUS SIMULANS (A)  Final   Report Status 08/03/2019 FINAL  Final   Organism ID, Bacteria STAPHYLOCOCCUS SIMULANS (A)  Final      Susceptibility   Staphylococcus simulans - MIC*    CIPROFLOXACIN >=8 RESISTANT Resistant     ERYTHROMYCIN <=0.25 SENSITIVE Sensitive     GENTAMICIN <=0.5 SENSITIVE Sensitive     OXACILLIN 1 RESISTANT Resistant      TETRACYCLINE <=1 SENSITIVE Sensitive     VANCOMYCIN <=0.5 SENSITIVE Sensitive     TRIMETH/SULFA <=10 SENSITIVE Sensitive     CLINDAMYCIN <=0.25 SENSITIVE Sensitive     RIFAMPIN <=0.5 SENSITIVE Sensitive     Inducible Clindamycin NEGATIVE Sensitive     * >=100,000 COLONIES/mL STAPHYLOCOCCUS SIMULANS  SARS CORONAVIRUS 2 (TAT 6-24 HRS) Nasopharyngeal Nasopharyngeal Swab     Status: None   Collection Time: 08/04/19  3:24 AM   Specimen: Nasopharyngeal Swab  Result Value Ref Range Status   SARS Coronavirus 2 NEGATIVE NEGATIVE Final    Comment: (NOTE) SARS-CoV-2 target nucleic acids are NOT DETECTED. The SARS-CoV-2 RNA is generally detectable in upper and lower respiratory specimens during the acute phase of infection. Negative results do not preclude SARS-CoV-2 infection, do not rule out co-infections with other pathogens, and should not be used as the sole basis for treatment or other patient management decisions. Negative results must be combined with clinical observations, patient history, and epidemiological information. The expected result is Negative. Fact Sheet for Patients: SugarRoll.be Fact Sheet for Healthcare Providers: https://www.woods-mathews.com/ This test is not yet approved or cleared by the [place] FDA and  has been authorized for detection and/or diagnosis of SARS-CoV-2 by FDA under an Emergency Use Authorization (EUA). This EUA will remain  in effect (meaning this test can be used) for the duration of the COVID-19 declaration under Section 56 4(b)(1) of the Act, 21 U.S.C. section 360bbb-3(b)(1), unless the authorization is terminated or revoked sooner. Performed at [facility], [place] [address] [place], [place] [address]   Culture, Urine     Status: Abnormal   Collection Time: 08/04/19  3:24 AM   Specimen: Urine, Catheterized  Result Value Ref Range Status   Specimen Description   Final    URINE,  CATHETERIZED Performed at [facility] [address], [place], [place] [address]    Special Requests   Final    NONE Performed at [facility], [place] [address], [place],  [address]    Culture MULTIPLE SPECIES PRESENT, SUGGEST RECOLLECTION (A)  Final   Report Status 08/05/2019 FINAL  Final     Radiology Studies: No results found.  Scheduled Meds: . buPROPion  300 mg Oral Daily  . cholecalciferol  2,000 Units Oral BID  . diltiazem  180 mg Oral Daily  . docusate sodium  100 mg Oral Daily  . doxycycline  100 mg Oral Q12H  . famotidine  20 mg Oral QHS  . furosemide  20 mg Oral BID  . gabapentin  300 mg Oral QID  . levothyroxine  75 mcg Oral  Daily  . nebivolol  5 mg Oral Daily  . omega-3 acid ethyl esters  1,000 mg Oral Daily  . pantoprazole  40 mg Oral Daily  . psyllium  1 packet Oral Daily  . senna-docusate  1 tablet Oral BID  . simvastatin  10 mg Oral QPM  . sorbitol  30 mL Oral Once  . tamsulosin  0.4 mg Oral Daily  . warfarin  5 mg Oral ONCE-1800  . Warfarin - Pharmacist Dosing Inpatient   Does not apply q1800   Continuous Infusions:   LOS: 1 day   Marylu Lund, MD [facility] Pager On Amion  If 7PM-7AM, please contact night-coverage 08/06/2019, 4:12 PM

## 2019-08-06 NOTE — TOC Progression Note (Signed)
Transition of Care [facility]) - Progression Note    Patient Details  Name: Toni [name] MRN: [id number] Date of Birth: 1945-11-17  [facility]) CM/SW Contact  [employer], Juliann Pulse, RN Phone Number: 08/06/2019, 2:32 PM  Clinical Narrative:  Patient ageed to fax out preferred [facility] been there several times. Await bed offers.covid ordered.     Expected Discharge Plan: [place] Barriers to Discharge: Continued Medical Work up  Expected Discharge Plan and Services Expected Discharge Plan: [place] Acute Care Choice: [place]                                         Social Determinants of Health (SDOH) Interventions    Readmission Risk Interventions No flowsheet data found.

## 2019-08-06 NOTE — Progress Notes (Signed)
Occupational Therapy Evaluation Patient Details Name: Toni [name] MRN: [id number] DOB: 04-16-1946 Today's Date: 08/06/2019    History of Present Illness 73 y.o. female with medical history significant of MS diagnosed in 1989, HTN, A.Fib on coumadin. Pt fell 3 weeks PTA, she is presented with worsening R buttock pain.   Clinical Impression   PTA, pt was living at home with her husband, who assisted pt with ADL/IADL and transfer to her w/c. Pt currently demonstrates limitations with bilateral hand fine motor coordination and strength impacting her independence with feeding and grooming. She requires minA-modA for bed mobility and maxA for 2x sit<>stand. Due to decline in current level of function, pt would benefit from acute OT to address established goals to facilitate safe D/C to venue listed below. At this time, recommend SNF follow-up. Will continue to follow acutely.     Follow Up Recommendations  SNF;Supervision/Assistance - 24 hour    Equipment Recommendations  3 in 1 bedside commode    Recommendations for Other Services       Precautions / Restrictions Precautions Precautions: Fall Precaution Comments: pt fell 3 weeks ago Restrictions Weight Bearing Restrictions: No      Mobility Bed Mobility Overal bed mobility: Needs Assistance Bed Mobility: Rolling;Sidelying to Sit;Sit to Supine Rolling: Min assist Sidelying to sit: Mod assist;HOB elevated   Sit to supine: Min assist   General bed mobility comments: minA for rolling;modA to progress trunk to upright posture;minA to return BLE to bed  Transfers Overall transfer level: Needs assistance Equipment used: 1 person hand held assist Transfers: Sit to/from Stand Sit to Stand: Max assist         General transfer comment: face to face with therapist sit<>stand x2;pt unable to come into standing fully upright    Balance Overall balance assessment: Needs assistance Sitting-balance support: Feet supported;No  upper extremity supported Sitting balance-Leahy Scale: Fair     Standing balance support: Bilateral upper extremity supported Standing balance-Leahy Scale: Zero Standing balance comment: unable to come to full stand                           ADL either performed or assessed with clinical judgement   ADL Overall ADL's : Needs assistance/impaired Eating/Feeding: Minimal assistance;Sitting Eating/Feeding Details (indicate cue type and reason): provided pt with built up grips to assist with feeding Grooming: Minimal assistance;Sitting   Upper Body Bathing: Minimal assistance;Sitting   Lower Body Bathing: Moderate assistance;Sitting/lateral leans   Upper Body Dressing : Minimal assistance;Sitting   Lower Body Dressing: Moderate assistance;Sitting/lateral leans   Toilet Transfer: Maximal assistance;Squat-pivot Toilet Transfer Details (indicate cue type and reason): clinical observation Toileting- Clothing Manipulation and Hygiene: Maximal assistance;Sit to/from stand Toileting - Clothing Manipulation Details (indicate cue type and reason): clinical observation     Functional mobility during ADLs: Moderate assistance;Maximal assistance General ADL Comments: pt limited by decreased strength in BUE;BLE weakness     Vision Patient Visual Report: No change from baseline       Perception     Praxis      Pertinent Vitals/Pain Pain Assessment: 0-10 Pain Score: 6  Pain Location: BLEs Pain Descriptors / Indicators: Sore Pain Intervention(s): Limited activity within patient's tolerance;Monitored during session     Hand Dominance Right   Extremity/Trunk Assessment Upper Extremity Assessment Upper Extremity Assessment: RUE deficits/detail;LUE deficits/detail RUE Deficits / Details: decreased grip strength 2/5, reports decreased sensation;decreased manipulation and fine motor skills;decreased shoulder ROM RUE Sensation: decreased proprioception;decreased light  touch RUE Coordination: decreased gross motor;decreased fine motor LUE Deficits / Details: same as RUE LUE Sensation: decreased proprioception;decreased light touch LUE Coordination: decreased gross motor;decreased fine motor   Lower Extremity Assessment Lower Extremity Assessment: Defer to PT evaluation   Cervical / Trunk Assessment Cervical / Trunk Assessment: Normal   Communication Communication Communication: No difficulties   Cognition Arousal/Alertness: Awake/alert Behavior During Therapy: WFL for tasks assessed/performed Overall Cognitive Status: Within Functional Limits for tasks assessed                                     General Comments  pt's husband present during session    Exercises     Shoulder Instructions      Home Living Family/patient expects to be discharged to:: Private residence Living Arrangements: Spouse/significant other Available Help at Discharge: Family;Available 24 hours/day Type of Home: Apartment Home Access: Ramped entrance     Home Layout: Two level;Able to live on main level with bedroom/bathroom         Bathroom Toilet: Handicapped height Bathroom Accessibility: Yes How Accessible: Accessible via wheelchair Home Equipment: Wheelchair - manual;Shower seat;Grab bars - tub/shower          Prior Functioning/Environment Level of Independence: Needs assistance  Gait / Transfers Assistance Needed: pt had HHPT and was walking short distances 2 weeks ago, PTA she required assist from her husband for transfers ADL's / Page Needed: husband assists with ADL             OT Problem List: Decreased strength;Decreased range of motion;Decreased activity tolerance;Impaired balance (sitting and/or standing);Decreased safety awareness;Decreased knowledge of use of DME or AE;Impaired UE functional use      OT Treatment/Interventions: Self-care/ADL training;Therapeutic exercise;Neuromuscular education;Energy  conservation;DME and/or AE instruction;Therapeutic activities;Patient/family education;Balance training    OT Goals(Current goals can be found in the care plan section) Acute Rehab OT Goals Patient Stated Goal: to be able to perform WC transfers OT Goal Formulation: With patient Time For Goal Achievement: 08/20/19 Potential to Achieve Goals: Good ADL Goals Pt Will Perform Eating: with modified independence;with adaptive utensils Pt Will Perform Grooming: with modified independence;with adaptive equipment Pt Will Perform Lower Body Dressing: sit to/from stand;with min assist Pt Will Transfer to Toilet: with min assist;stand pivot transfer  OT Frequency: Min 2X/week   Barriers to D/C: Decreased caregiver support  pt's husband unable to provide appropriate level of assistance for pt's current level of functioning        Co-evaluation              AM-PAC OT "6 Clicks" Daily Activity     Outcome Measure Help from another person eating meals?: A Little Help from another person taking care of personal grooming?: A Little Help from another person toileting, which includes using toliet, bedpan, or urinal?: A Lot Help from another person bathing (including washing, rinsing, drying)?: A Lot Help from another person to put on and taking off regular upper body clothing?: A Little Help from another person to put on and taking off regular lower body clothing?: A Lot 6 Click Score: 15   End of Session Equipment Utilized During Treatment: Gait belt Nurse Communication: Mobility status  Activity Tolerance: Patient tolerated treatment well Patient left: in bed;with call bell/phone within reach;with bed alarm set  OT Visit Diagnosis: Unsteadiness on feet (R26.81);Other abnormalities of gait and mobility (R26.89);Muscle weakness (generalized) (M62.81);Pain Pain - Right/Left: (bilat) Pain -  part of body: Leg                Time: [id number] OT Time Calculation (min): 23 min Charges:  OT  General Charges $OT Visit: 1 Visit OT Evaluation $OT Eval Moderate Complexity: 1 Mod OT Treatments $Self Care/Home Management : 8-22 mins  Dorinda Muraski OTR/L Acute Rehabilitation Services Office: [phone]   Wyn Forster 08/06/2019, 4:28 PM

## 2019-08-06 NOTE — Telephone Encounter (Signed)
Patient called to advise that she is currently admitted at [facility] long and is going to possibly be transferred to [place] rehab. Patient states she is unable to do functions that she has been able to do previously.  Please follow up

## 2019-08-06 NOTE — NC FL2 (Signed)
[place] LEVEL OF CARE SCREENING TOOL     IDENTIFICATION  Patient Name: Toni [name] Birthdate: 12-12-1945 Sex: female Admission Date (Current Location): 08/03/2019  [facility] and [place] Number:  Herbalist and Address:  [facility],  [place] [address] [place]      Provider Number:    Attending Physician Name and Address:  Donne Hazel, MD  Relative Name and Phone Number:  [place] spouse [phone]    Current Level of Care: Hospital Recommended Level of Care: [place] Prior Approval Number:    Date Approved/Denied:   PASRR Number: [id number]  Discharge Plan: SNF    Current Diagnoses: Patient Active Problem List   Diagnosis Date Noted  . Bilateral leg weakness 08/04/2019  . Acute lower UTI 08/04/2019  . Weakness 08/04/2019  . Unilateral primary osteoarthritis, left knee 01/09/2019  . Type 2 diabetes mellitus without complication, without long-term current use of insulin ([place]) 08/27/2018  . Obstructive sleep apnea treated with bilevel positive airway pressure (BiPAP) 08/15/2018  . Sleep apnea treated with nocturnal BiPAP 04/23/2018  . 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 ([place]) 11/21/2017  . Permanent atrial fibrillation ([place]) 11/21/2017  . Tightness of leg fascia 11/21/2017  . Muscle spasm of both lower legs 11/21/2017  . Carpal tunnel syndrome, left upper limb 09/13/2017  . Carpal tunnel syndrome, right upper limb 09/13/2017  . Other insomnia 06/11/2017  . Depression 06/11/2017  . Abdominal swelling 03/29/2017  . Central apnea 03/26/2017  . Class 3 obesity with serious comorbidity and body mass index (BMI) of 40.0 to 44.9 in adult 03/22/2017  . Vitamin D deficiency 03/22/2017  . Prediabetes 03/22/2017  . Venous insufficiency 02/14/2017  . Right hand weakness 08/06/2016  . Nonischemic cardiomyopathy ([place]) 05/09/2016  . Localized edema 05/09/2016  . Long term current use  of anticoagulant therapy 02/03/2016  . Combined systolic and diastolic congestive heart failure ([employer]) 01/19/2016  . Atrial fibrillation with RVR ([place]) 01/19/2016  . 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 Obesity hypoventilation syndrome ([place]) 03/30/2015  . Abnormality of gait 03/01/2015  . Chronic constipation with overflow incontinence 01/25/2015  . 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) ([place]) 01/25/2015  . 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 Closed right ankle fracture 10/30/2013  . Leucocytosis 10/30/2013  . Varicose veins of both lower extremities with complications 0000000  . Muscle spasms of lower extremity 02/24/2013  . Long term (current) use of anticoagulants 11/05/2012  . 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 Aspiration pneumonia vs Failed outpatient Rx of CAP 11/26/2011  . Essential hypertension   . Thyroid disease   . HLD (hyperlipidemia)     Orientation RESPIRATION BLADDER Height & Weight     Self, Time, Situation  Other (Comment)(CPAP HS) Incontinent Weight: 77.1 kg Height:  5\' 3"  (160 cm)  BEHAVIORAL SYMPTOMS/MOOD NEUROLOGICAL BOWEL NUTRITION STATUS      Incontinent Diet(Regular)  AMBULATORY STATUS COMMUNICATION OF NEEDS Skin   Limited Assist Verbally Normal                       Personal Care Assistance Level of Assistance  Bathing, Feeding, Dressing Bathing Assistance: Limited assistance Feeding assistance: Limited assistance Dressing Assistance: Limited assistance     Functional Limitations Info  Sight, Hearing, Speech Sight Info: Adequate Hearing Info: Adequate Speech  Info: Adequate    SPECIAL CARE FACTORS FREQUENCY  PT (By licensed PT), OT (By licensed OT)     PT Frequency: 5x week OT Frequency: 5x week            Contractures Contractures Info: Not present     Additional Factors Info  Code Status Code Status Info: Full code             Current Medications (08/06/2019):  This is the current hospital active medication list Current Facility-Administered Medications  Medication Dose Route Frequency Provider Last Rate Last Admin  . acetaminophen (TYLENOL) tablet 500-1,000 mg  500-1,000 mg Oral Q6H PRN Etta Quill, DO   1,000 mg at 08/05/19 2135  . ALPRAZolam Duanne Moron) tablet 0.5 mg  0.5 mg Oral QHS PRN Etta Quill, DO      . baclofen (LIORESAL) tablet 20 mg  20 mg Oral BID PRN Etta Quill, DO      . buPROPion (WELLBUTRIN XL) 24 hr tablet 300 mg  300 mg Oral Daily Jennette Kettle M, DO   300 mg at 08/06/19 [id number]  . cholecalciferol (VITAMIN D) tablet 2,000 Units  2,000 Units Oral BID Etta Quill, DO   2,000 Units at 08/06/19 0840  . diltiazem (CARDIZEM CD) 24 hr capsule 180 mg  180 mg Oral Daily Etta Quill, DO   180 mg at 08/06/19 0856  . docusate sodium (COLACE) capsule 100 mg  100 mg Oral Daily Jennette Kettle M, DO   100 mg at 08/06/19 0841  . doxycycline (VIBRA-TABS) tablet 100 mg  100 mg Oral Q12H Jennette Kettle M, DO   100 mg at 08/06/19 0840  . famotidine (PEPCID) tablet 20 mg  20 mg Oral QHS Jennette Kettle M, DO   20 mg at 08/05/19 2132  . fluticasone (FLONASE) 50 MCG/ACT nasal spray 2 spray  2 spray Each Nare Daily PRN Etta Quill, DO      . furosemide (LASIX) tablet 20 mg  20 mg Oral BID Jennette Kettle M, DO   20 mg at 08/06/19 1119  . gabapentin (NEURONTIN) capsule 300 mg  300 mg Oral QID Jennette Kettle M, DO   300 mg at 08/06/19 1405  . HYDROcodone-acetaminophen (NORCO/VICODIN) 5-325 MG per tablet 1 tablet  1 tablet Oral Q4H PRN Eugenie Filler, MD      . lactulose (CHRONULAC) 10 GM/15ML solution 30 g  30 g Oral Daily PRN Etta Quill, DO      . levothyroxine (SYNTHROID) tablet 75 mcg  75 mcg Oral Daily Jennette Kettle M, DO   75 mcg at 08/06/19 0601  . morphine 2 MG/ML injection 1-2 mg  1-2 mg  Intravenous Q4H PRN Eugenie Filler, MD      . nebivolol (BYSTOLIC) tablet 5 mg  5 mg Oral Daily Jennette Kettle M, DO   5 mg at 08/06/19 1119  . omega-3 acid ethyl esters (LOVAZA) capsule 1,000 mg  1,000 mg Oral Daily Jennette Kettle M, DO   1,000 mg at 08/06/19 0843  . ondansetron (ZOFRAN) tablet 4 mg  4 mg Oral Q6H PRN Etta Quill, DO       Or  . ondansetron [facility]) injection 4 mg  4 mg Intravenous Q6H PRN Etta Quill, DO      . pantoprazole (PROTONIX) EC tablet 40 mg  40 mg Oral Daily Jennette Kettle M, DO   40 mg at 08/06/19 0841  . psyllium (HYDROCIL/METAMUCIL) packet 1 packet  1 packet Oral Daily Etta Quill, DO   1 packet at 08/06/19 [phone]  . senna-docusate (Senokot-S) tablet 1 tablet  1 tablet Oral BID Eugenie Filler, MD   1 tablet at 08/06/19 [phone]  . simvastatin (ZOCOR) tablet 10 mg  10 mg Oral QPM Jennette Kettle M, DO   10 mg at 08/05/19 1817  . sorbitol 70 % solution 30 mL  30 mL Oral Once Eugenie Filler, MD      . tamsulosin [facility]) capsule 0.4 mg  0.4 mg Oral Daily Jennette Kettle M, DO      . warfarin (COUMADIN) tablet 5 mg  5 mg Oral ONCE-1800 Lenis Noon, [facility]      . Warfarin - Pharmacist Dosing Inpatient   Does not apply q1800 Angela Adam [facility]         Discharge Medications: Please see discharge summary for a list of discharge medications.  Relevant Imaging Results:  Relevant Lab Results:   Additional Information ss#[id number], [place], [place]

## 2019-08-06 NOTE — Telephone Encounter (Signed)
I contacted the pt. She states she has been hospitalized for a UTI and once the infection has improved she will be moved to [facility]. She reports she fell a few weeks ago and had been told she may have a small break in her sacrum. She wanted Dr. Brett Fairy to be updated on this.

## 2019-08-07 ENCOUNTER — Ambulatory Visit: Payer: Self-pay | Admitting: Adult Health

## 2019-08-07 DIAGNOSIS — K5900 Constipation, unspecified: Secondary | ICD-10-CM

## 2019-08-07 LAB — PROTIME-INR
INR: 2.2 — ABNORMAL HIGH (ref 0.8–1.2)
Prothrombin Time: 24 seconds — ABNORMAL HIGH (ref 11.4–15.2)

## 2019-08-07 MED ORDER — WARFARIN SODIUM 6 MG PO TABS
6.0000 mg | ORAL_TABLET | Freq: Once | ORAL | Status: AC
  Administered 2019-08-07: 6 mg via ORAL
  Filled 2019-08-07: qty 1

## 2019-08-07 NOTE — Progress Notes (Signed)
PROGRESS NOTE    Toni [name]  [id number] DOB: 01-23-1946 DOA: 08/03/2019 PCP: Prince Solian, MD    Brief Narrative:  73 y.o.femalewith medical history significant ofMS diagnosed in 1989, HTN, A.Fib on coumadin.  At baseline she is mostly wheelchair bound but can transfer.  Looks like she takes Acthar as needed for MS flares but not on any chronic disease modifying therapies.  ~3 weeks ago, patient had fall, landed on buttock. Came to ED on Thurs for continued R sided buttock pain. X ray is ? For non-displaced fracture, she was discharged home.  Today noticed that her R sided buttock pain started to radiate to L side. B legs seemed weaker and having trouble transfering herself, normally able to transfer independently. Presents to ED.  ED Course:Feeling better after pain meds.  CT abd/pelvis shows no obvious hematoma, does show questionable non-displaced sacral fracture.  Of note her UA from 12/10 has come back growing staph simulans.  Tele-neuro consulted (see their extensive note). 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  CT abdomen and pelvis done concerning for nondisplaced sacral fracture.   -Due to timing between following worsening symptoms there was some concern that patient's pain likely not necessarily secondary to fall.   -Patient was seen by telemetry neurology who recommended MRI of the  C/T/L-spine to rule out MS flare which was done.  MRI which was done showed no definite new cord lesion or abnormal enhancement with suboptimal evaluation due to artifact.   -Postop and degenerative changes noted.   -Urinalysis done concerning for UTI and as such patient started on doxycycline.   -Urine cultures from 07/31/2019 which was done was positive for Staphylococcus simulans.  Continue doxycycline.   -Dr. Grandville Silos discussed with neurology Dr.Aroor who reviewed MRI films and felt no sign of MS flare and recommended treatment for infection, pain management.   -PT/OT rec for SNF -Ordered and personally reviewed brain MRI with patient. Stable findings compared to prior MRI from 2017  2.  UTI -Urine cultures from 07/31/2019 when patient had presented to the ED positive for greater than 100,000 colonies of Staphylococcus simulans.  Sensitivities resistant to ciprofloxacin and oxacillin and sensitive to tetracyclines, clindamycin, gentamicin, Bactrim and vancomycin.   -Patient on doxycycline which we will continue for now. Anticipate total of 5-7 days tx -Remains afebrile  3.  Obstructive sleep apnea -Continue with BiPAP nightly.  4.  Paroxysmal atrial fibrillation -Continue Cardizem and Bystolic for rate control.   -Coumadin for anticoagulation. -INR stable, reviewed with patient  5.  MS -No MS flare noted on MRI of spine.  -Brain MRI ordered, pending  6.  Hypertension -Continue Cardizem and Bystolic, Flomax. -Stable at this time  7.  Hypothyroidism -Continue Synthroid as pt tolerates  8.  Chronic constipation -Continue with cathartics as needed  9.  Gastroesophageal reflux disease -Continue on PPI. -Stable at this time  DVT prophylaxis: Coumadin Code Status: Full Family Communication: Pt in room, family not  at bedside Disposition Plan: SNF, possible in 24hrs  Consultants:     Procedures:     Antimicrobials: Anti-infectives (From admission, onward)    Start     Dose/Rate Route Frequency Ordered Stop   08/04/19 1400  cefTRIAXone (ROCEPHIN) 1 g in sodium chloride 0.9 % 100 mL IVPB  Status:  Discontinued     1 g 200 mL/hr over 30 Minutes Intravenous Every 24 hours 08/04/19 1330 08/05/19 1901   08/04/19 0730  doxycycline (VIBRA-TABS) tablet 100 mg     100 mg Oral Every 12 hours 08/04/19 0545        Subjective: Without complaints this AM  Objective: Vitals:   08/07/19 0630 08/07/19 0645 08/07/19 0853 08/07/19 1317  BP: 104/74  (!) 118/53 (!) 106/48  Pulse: 72  89 85  Resp: 18   16  Temp: 98.1 F (36.7 C)   98.2 F (36.8 C)  TempSrc: Oral     SpO2: 95%  99% 100%  Weight:  85.7 kg    Height:        Intake/Output Summary (Last 24 hours) at 08/07/2019 1619 Last data filed at 08/07/2019 0645 Gross per 24 hour  Intake --  Output 400 ml  Net -400 ml   Filed Weights   08/03/19 2341 08/07/19 0645  Weight: 77.1 kg 85.7 kg    Examination: General exam: Conversant, in no acute distress Respiratory system: normal chest rise, clear, no audible wheezing Cardiovascular system: regular rhythm, s1-s2 Gastrointestinal system: Nondistended, nontender, pos BS Central nervous system: No seizures, no tremors Extremities: No cyanosis, no joint deformities Skin: No rashes, no pallor Psychiatry: Affect normal // no auditory hallucinations   Data Reviewed: I have personally reviewed following labs and imaging studies  CBC: Recent Labs  Lab 08/04/19 0116 08/05/19 0547 08/06/19 0540  WBC 12.8* 9.8 8.9  NEUTROABS 9.1* 5.6  --   HGB 14.2 13.6 13.5  HCT 43.5 42.2 41.7  MCV 94.8 96.8 95.9  PLT 228 186 [id number]   Basic Metabolic Panel: Recent Labs  Lab 08/04/19 0116 08/05/19 0547 08/06/19 0540  NA 140 140 140  K 4.0 3.6 3.9  CL 100 101 100  CO2 31 30 32  GLUCOSE 120* 79 93  BUN 23 19 22   CREATININE 0.68 0.52 0.56  CALCIUM 9.6 8.3* 8.6*   GFR: Estimated Creatinine Clearance: 65.9 mL/min (by C-G formula based on SCr of 0.56  mg/dL). Liver Function Tests: No results for input(s): AST, ALT, ALKPHOS, BILITOT, PROT, ALBUMIN in the last 168 hours. No results for input(s): LIPASE, AMYLASE in the last 168 hours. No results for input(s): AMMONIA in the last 168 hours. Coagulation Profile: Recent Labs  Lab 08/04/19 0116 08/05/19 0547 08/06/19 0540 08/07/19 0548  INR 3.0* 2.7* 2.7* 2.2*   Cardiac Enzymes: No results for input(s): CKTOTAL, CKMB, CKMBINDEX, TROPONINI in the last 168 hours. BNP (last 3 results) No results for input(s): PROBNP in the last 8760 hours. HbA1C: No results for input(s): HGBA1C in the last 72 hours. CBG: No results for input(s): GLUCAP in the last 168 hours. Lipid Profile: No results for input(s): CHOL, HDL, LDLCALC, TRIG, CHOLHDL, LDLDIRECT in the last 72 hours. Thyroid Function Tests: No results for input(s): TSH, T4TOTAL, FREET4, T3FREE, THYROIDAB in the last 72 hours. Anemia Panel: No results for input(s): VITAMINB12, FOLATE, FERRITIN, TIBC, IRON, RETICCTPCT in the last 72 hours. Sepsis Labs: No results for input(s): PROCALCITON, LATICACIDVEN in the last 168 hours.  Recent Results (from the past 240 hour(s))  Urine culture     Status: Abnormal   Collection Time: 07/31/19 10:28 PM   Specimen: Urine, Clean Catch  Result Value Ref Range Status   Specimen Description   Final    Urine Performed at [facility] [address], [place], [place] [address]    Special Requests   Final    NONE Performed at [facility], [place] [address] [place], [place] [address]    Culture >=100,000 COLONIES/mL STAPHYLOCOCCUS SIMULANS (A)  Final   Report Status 08/03/2019 FINAL  Final   Organism ID, Bacteria STAPHYLOCOCCUS SIMULANS (A)  Final      Susceptibility   Staphylococcus simulans - MIC*    CIPROFLOXACIN >=8 RESISTANT Resistant     ERYTHROMYCIN <=0.25 SENSITIVE Sensitive     GENTAMICIN <=0.5 SENSITIVE Sensitive     OXACILLIN 1 RESISTANT Resistant      TETRACYCLINE <=1 SENSITIVE Sensitive     VANCOMYCIN <=0.5 SENSITIVE Sensitive     TRIMETH/SULFA <=10 SENSITIVE Sensitive     CLINDAMYCIN <=0.25 SENSITIVE Sensitive     RIFAMPIN <=0.5 SENSITIVE Sensitive     Inducible Clindamycin NEGATIVE Sensitive     * >=100,000 COLONIES/mL STAPHYLOCOCCUS SIMULANS  SARS CORONAVIRUS 2 (TAT 6-24 HRS) Nasopharyngeal Nasopharyngeal Swab     Status: None   Collection Time: 08/04/19  3:24 AM   Specimen: Nasopharyngeal Swab  Result Value Ref Range Status   SARS Coronavirus 2 NEGATIVE NEGATIVE Final    Comment: (NOTE) SARS-CoV-2 target nucleic acids are NOT DETECTED. The SARS-CoV-2 RNA is generally detectable in upper and lower respiratory specimens during the acute phase of infection. Negative results do not preclude SARS-CoV-2 infection, do not rule out co-infections with other pathogens, and should not be used as the sole basis for treatment or other patient management decisions. Negative results must be combined with clinical observations, patient history, and epidemiological information. The expected result is Negative. Fact Sheet for Patients: SugarRoll.be Fact Sheet for Healthcare Providers: https://www.woods-mathews.com/ This test is not yet approved or cleared by the [place] FDA and  has been authorized for detection and/or diagnosis of SARS-CoV-2 by FDA under an Emergency Use Authorization (EUA). This EUA will remain  in effect (meaning this test can be used) for the duration of the COVID-19 declaration under Section 56 4(b)(1) of the Act, 21 U.S.C. section 360bbb-3(b)(1), unless the authorization is terminated or revoked sooner. Performed at [facility], [place] [address] [place], [place] [address]   Culture, Urine     Status: Abnormal   Collection Time: 08/04/19  3:24 AM   Specimen: Urine, Catheterized  Result Value Ref Range Status   Specimen Description   Final    URINE,  CATHETERIZED Performed at [facility] [address], [place], [place] [address]    Special Requests   Final    NONE Performed at [facility], [place] [address], [place],  [address]    Culture MULTIPLE SPECIES PRESENT, SUGGEST RECOLLECTION (A)  Final   Report Status 08/05/2019 FINAL  Final  SARS CORONAVIRUS 2 (TAT 6-24 HRS) Nasopharyngeal Nasopharyngeal Swab     Status: None   Collection Time: 08/06/19  2:22 PM   Specimen: Nasopharyngeal Swab  Result Value Ref Range Status   SARS Coronavirus 2 NEGATIVE NEGATIVE Final    Comment: (NOTE) SARS-CoV-2 target nucleic acids are NOT DETECTED. The SARS-CoV-2 RNA is generally detectable in upper and lower respiratory specimens during the acute phase of infection. Negative results do not  preclude SARS-CoV-2 infection, do not rule out co-infections with other pathogens, and should not be used as the sole basis for treatment or other patient management decisions. Negative results must be combined with clinical observations, patient history, and epidemiological information. The expected result is Negative. Fact Sheet for Patients: SugarRoll.be Fact Sheet for Healthcare Providers: https://www.woods-mathews.com/ This test is not yet approved or cleared by the [place] FDA and  has been authorized for detection and/or diagnosis of SARS-CoV-2 by FDA under an Emergency Use Authorization (EUA). This EUA will remain  in effect (meaning this test can be used) for the duration of the COVID-19 declaration under Section 56 4(b)(1) of the Act, 21 U.S.C. section 360bbb-3(b)(1), unless the authorization is terminated or revoked sooner. Performed at [facility], [place] [address], [place], [place] [address]      Radiology Studies: MR BRAIN WO CONTRAST  Result Date: 08/06/2019 CLINICAL DATA:  Rule out CVA. Focal neuro deficit. History of MS. EXAM: MRI HEAD  WITHOUT CONTRAST TECHNIQUE: Multiplanar, multiecho pulse sequences of the brain and surrounding structures were obtained without intravenous contrast. COMPARISON:  CT head 08/06/2016, MRI 08/08/2016 FINDINGS: Brain: Ventricle size normal. Negative for acute infarct. Periventricular white matter hyperintensity similar to 2017. Pattern is consistent with multiple sclerosis. Negative for hemorrhage or mass. Fat is seen floating in the frontal horns of the ventricles bilaterally and also in the posterior fossa, unchanged from the prior study. Vascular: Normal arterial flow voids Skull and upper cervical spine: Negative Sinuses/Orbits: Paranasal sinuses clear. Bilateral cataract surgery Other: None IMPRESSION: No acute abnormality and no change from the prior study. 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 Electronically Signed   By: Franchot Gallo M.D.   On: 08/06/2019 17:29    Scheduled Meds: . buPROPion  300 mg Oral Daily  . cholecalciferol  2,000 Units Oral BID  . diltiazem  180 mg Oral Daily  . docusate sodium  100 mg Oral Daily  . doxycycline  100 mg Oral Q12H  . famotidine  20 mg Oral QHS  . furosemide  20 mg Oral BID  . gabapentin  300 mg Oral QID  . levothyroxine  75 mcg Oral Daily  . nebivolol  5 mg Oral Daily  . omega-3 acid ethyl esters  1,000 mg Oral Daily  . pantoprazole  40 mg Oral Daily  . psyllium  1 packet Oral Daily  . senna-docusate  1 tablet Oral BID  . simvastatin  10 mg Oral QPM  . sorbitol  30 mL Oral Once  . tamsulosin  0.4 mg Oral Daily  . warfarin  6 mg Oral ONCE-1800  . Warfarin - Pharmacist Dosing Inpatient   Does not apply q1800   Continuous Infusions:   LOS: 2 days   Marylu Lund, MD [facility] Pager On Amion  If 7PM-7AM, please contact night-coverage 08/07/2019, 4:19 PM

## 2019-08-07 NOTE — Progress Notes (Signed)
Physical Therapy Treatment Patient Details Name: Toni [name] MRN: [id number] DOB: Feb 17, 1946 Today's Date: 08/07/2019    History of Present Illness 73 y.o. female with medical history significant of MS diagnosed in 1989, HTN, A.Fib on coumadin. Pt fell 3 weeks PTA, she is presented with worsening R buttock pain.  Imaging did indicate possible nondisplaced fx of sacrum - no precautions ordered.    PT Comments    Pt with good participation today.  She continued to require mod-max A of 2 but was able to clear buttock enough with standing for use of Stedy lift today.  Performed multiple sit to stands and standing in [place].  Pt's ability to stand limited by limited knee extension (pt reports baseline).  Cont POC.    Follow Up Recommendations  SNF     Equipment Recommendations  None recommended by PT    Recommendations for Other Services       Precautions / Restrictions Precautions Precautions: Fall Restrictions Weight Bearing Restrictions: No    Mobility  Bed Mobility Overal bed mobility: Needs Assistance Bed Mobility: Rolling;Sidelying to Sit;Sit to Sidelying Rolling: Min assist Sidelying to sit: Mod assist;+2 for safety/equipment     Sit to sidelying: Mod assist;+2 for safety/equipment General bed mobility comments: cues for techinque and increased time; assist to raise up trunk and return Bil LE to bed  Transfers Overall transfer level: Needs assistance Equipment used: Rolling walker (2 wheeled) Transfers: Sit to/from [facility] Sit to Stand: +2 physical assistance;Mod assist Stand pivot transfers: Total assist(with Stedy)       General transfer comment: Performed sit to stand x 4 from bed; x1 from [facility]; and x 2 from [place].  She required cues for safe hand placement and mod A x 2 to power up; pt had difficulty getting knees extended (reports this is baseline due to tight hamstrings)  Ambulation/Gait             General Gait Details:  deferred   Stairs             Wheelchair Mobility    Modified Rankin (Stroke Patients Only)       Balance Overall balance assessment: Needs assistance Sitting-balance support: Feet supported;No upper extremity supported Sitting balance-Leahy Scale: Fair Sitting balance - Comments: Pt at EOB for 10 minute with SBA for safety   Standing balance support: Bilateral upper extremity supported Standing balance-Leahy Scale: Zero Standing balance comment: required mod A x 2; stood for ~10 sec for 3 reps but unable to achieve full standing due to limited knee ext                            Cognition Arousal/Alertness: Awake/alert Behavior During Therapy: WFL for tasks assessed/performed Overall Cognitive Status: Within Functional Limits for tasks assessed                                        Exercises      General Comments General comments (skin integrity, edema, etc.): Pt performed 3 sit to stand, then sit to stand with Stedy for transfer to bsc and back to bed      Pertinent Vitals/Pain Pain Assessment: Faces Faces Pain Scale: Hurts little more Pain Location: R buttock Pain Descriptors / Indicators: Sore Pain Intervention(s): Limited activity within patient's tolerance;Repositioned    Home Living  Prior Function            PT Goals (current goals can now be found in the care plan section) Progress towards PT goals: Progressing toward goals    Frequency    Min 2X/week      PT Plan Current plan remains appropriate    Co-evaluation              AM-PAC PT "6 Clicks" Mobility   Outcome Measure  Help needed turning from your back to your side while in a flat bed without using bedrails?: A Lot Help needed moving from lying on your back to sitting on the side of a flat bed without using bedrails?: A Lot Help needed moving to and from a bed to a chair (including a wheelchair)?: Total Help  needed standing up from a chair using your arms (e.g., wheelchair or bedside chair)?: Total Help needed to walk in hospital room?: Total Help needed climbing 3-5 steps with a railing? : Total 6 Click Score: 8    End of Session Equipment Utilized During Treatment: Gait belt Activity Tolerance: Patient limited by fatigue Patient left: in bed;with call bell/phone within reach;with family/visitor present;with bed alarm set Nurse Communication: Mobility status;Need for lift equipment PT Visit Diagnosis: History of falling (Z91.81);Difficulty in walking, not elsewhere classified (R26.2);Muscle weakness (generalized) (M62.81);Pain     Time: 1450-1520 PT Time Calculation (min) (ACUTE ONLY): 30 min  Charges:  $Therapeutic Activity: 23-37 mins                     Maggie Font, PT Acute Rehab Services Pager [phone] [facility] Rehab [phone] [facility] [phone]    Karlton Lemon 08/07/2019, 4:53 PM

## 2019-08-07 NOTE — Progress Notes (Signed)
ANTICOAGULATION CONSULT NOTE -   Pharmacy Consult for warfarin Indication: atrial fibrillation  Allergies  Allergen Reactions  . Hydrocodone Nausea And Vomiting    Hydrocodone causes vomiting  . Oxycodone Nausea And Vomiting    Oral oxycodone causes vomiting  . Chlorhexidine Gluconate Rash    Sores at site  . Zoloft [Sertraline] Hives, Swelling and Rash    Patient Measurements: Height: 5\' 3"  (160 cm) Weight: 189 lb (85.7 kg) IBW/kg (Calculated) : 52.4   Vital Signs: Temp: 98.1 F (36.7 C) (12/17 0630) Temp Source: Oral (12/17 0630) BP: 118/53 (12/17 0853) Pulse Rate: 89 (12/17 0853)  Labs: Recent Labs    08/05/19 0547 08/06/19 0540 08/07/19 0548  HGB 13.6 13.5  --   HCT 42.2 41.7  --   PLT 186 190  --   LABPROT 28.2* 28.4* 24.0*  INR 2.7* 2.7* 2.2*  CREATININE 0.52 0.56  --     Estimated Creatinine Clearance: 65.9 mL/min (by C-G formula based on SCr of 0.56 mg/dL).   Medical History: Past Medical History:  Diagnosis Date  . Abnormality of gait 03/01/2015  . Anxiety   . Arthritis    "knees" (01/19/2016)  . Atrial fibrillation with RVR ([place]) 01/19/2016  . Back pain   . Chest pain   . Childhood asthma   . Chronic pain    "nerve pain from the MS"  . Constipation   . Depression   . DVT (deep venous thrombosis) ([place]) 1983   "RLE; may have just been phlebitis; it was before the age of dopplers"  . 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 GERD (gastroesophageal reflux disease)   . History of stress test 06/21/10   limited exam with some degree of breast attenuatin of the apex however a component of apical ischemia is not excluded, EF 62%; cardiac cath   . HLD (hyperlipidemia)   . Hx of echocardiogram 04/22/10   EF 55-60%, no valve issues  . Hypertension   . Hypothyroidism   . Joint pain   . Migraine    "none since I stopped beta blocker in 1990s" (01/19/2016)  . Multiple sclerosis ([place])    has had this  may 1989-[place] reg doc  . OSA on CPAP   . Osteoarthritis   . PAF (paroxysmal atrial fibrillation) ([place])    on coumadin; documented on monitor 06/2010  . Pneumonia 11/2011  . Shortness of breath   . Thyroid disease     Medications:  Scheduled:  . buPROPion  300 mg Oral Daily  . cholecalciferol  2,000 Units Oral BID  . diltiazem  180 mg Oral Daily  . docusate sodium  100 mg Oral Daily  . doxycycline  100 mg Oral Q12H  . famotidine  20 mg Oral QHS  . furosemide  20 mg Oral BID  . gabapentin  300 mg Oral QID  . levothyroxine  75 mcg Oral Daily  . nebivolol  5 mg Oral Daily  . omega-3 acid ethyl esters  1,000 mg Oral Daily  . pantoprazole  40 mg Oral Daily  . psyllium  1 packet Oral Daily  . senna-docusate  1 tablet Oral BID  . simvastatin  10 mg Oral QPM  . sorbitol  30 mL Oral Once  . tamsulosin  0.4 mg Oral Daily  . Warfarin - Pharmacist Dosing Inpatient   Does not apply q1800    Assessment: 46 yoF with  extremity weakness on chronic warfarin for a-fib.  Home dose: 5 mg daily, 2.5 mg Fridays. LD = 12/13 2200, no dose given 12/14  08/07/2019  INR = 2.2 remains therapeutic, but slight decrease, likely reflection of no warfarin dose on 12/14  Hgb/Plt both WNL and stable  Patient eating 30% of meals  Drug interactions: Pt on doxycycline which may enhance the anticoagulant effect of warfarin.  Goal of Therapy:  INR 2-3   Plan:   Warfarin 6 mg PO this evening  INR daily while inpatient and on antibiotics  Pt will require more frequent INR monitoring while on antibiotics and at completion of ABX course.   Current discharge recommendation: Continue home dose of warfarin with INR check within 48 hours of discharge for further dose guidance.  Lenis Noon, PharmD 08/07/19 10:40 AM

## 2019-08-08 LAB — PROTIME-INR
INR: 2.4 — ABNORMAL HIGH (ref 0.8–1.2)
Prothrombin Time: 26.1 seconds — ABNORMAL HIGH (ref 11.4–15.2)

## 2019-08-08 MED ORDER — WARFARIN SODIUM 2.5 MG PO TABS
2.5000 mg | ORAL_TABLET | Freq: Once | ORAL | Status: AC
  Administered 2019-08-08: 2.5 mg via ORAL
  Filled 2019-08-08: qty 1

## 2019-08-08 MED ORDER — DOXYCYCLINE HYCLATE 100 MG PO TABS: 100.0000 mg | ORAL_TABLET | Freq: Two times a day (BID) | ORAL | 0 refills | Status: AC

## 2019-08-08 MED ORDER — MELATONIN 3 MG PO TABS: 1.0000 | ORAL_TABLET | Freq: Every evening | ORAL | 0 refills | Status: DC | PRN

## 2019-08-08 MED ORDER — HYDROCODONE-ACETAMINOPHEN 5-325 MG PO TABS: 1.0000 | ORAL_TABLET | Freq: Four times a day (QID) | ORAL | 0 refills | Status: DC | PRN

## 2019-08-08 MED ORDER — ALPRAZOLAM 0.5 MG PO TABS: ORAL_TABLET | ORAL | 0 refills | Status: DC

## 2019-08-08 NOTE — Discharge Summary (Signed)
Physician Discharge Summary  Toni [name] [id number] DOB: 15-Jan-1946 DOA: 08/03/2019  PCP: Prince Solian, MD  Admit date: 08/03/2019 Discharge date: 08/08/2019  Admitted From: Home Disposition:  SNF  Recommendations for Outpatient Follow-up:  1. Follow up with PCP in 1-2 weeks 2. Continue coumadin with goal INR 2-3 3. INR on d/c of 2.4  Discharge Condition:Stable CODE STATUS:Full Diet recommendation: Diabetic   Brief/Interim Summary: 73 y.o.femalewith medical history significant ofMS diagnosed in 1989, HTN, A.Fib on coumadin.  At baseline she is mostly wheelchair bound but can transfer.  Looks like she takes Acthar as needed for MS flares but not on any chronic disease modifying therapies.  ~3 weeks ago, patient had fall, landed on buttock. Came to ED on Thurs for continued R sided buttock pain. X ray is ? For non-displaced fracture, she was discharged home.  Today noticed that her R sided buttock pain started to radiate to L side. B legs seemed weaker and having trouble transfering herself, normally able to transfer independently. Presents to ED.  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 CT abdomen and pelvis done concerning for nondisplaced sacral fracture.  -Due to timing between following worsening symptoms there was some concern that patient's pain likely not necessarily secondary to fall.  -Patient was seen  by telemetry neurology who recommended MRI of the C/T/L-spine to rule out MS flare which was done. MRI which was done showed no definite new cord lesion or abnormal enhancement with suboptimal evaluation due to artifact.  -Postop and degenerative changes noted.  -Urinalysis done concerning for UTI and as such patient started on doxycycline.  -Urine cultures from 07/31/2019 which was done was positive for Staphylococcus simulans. Continue doxycycline.  -Dr. Grandville Silos discussed with neurology Dr.Aroorwho reviewed MRI films and felt no sign of MS flare and recommended treatment for infection, pain management.  -PT/OT rec for SNF -Ordered and personally reviewed brain MRI with patient. Stable findings compared to prior MRI from 2017  2. UTI -Urine cultures from 07/31/2019 when patient had presented to the ED positive for greater than 100,000 colonies of Staphylococcus simulans. Sensitivities resistant to ciprofloxacin and oxacillin and sensitive to tetracyclines, clindamycin, gentamicin, Bactrim and vancomycin.  -Patient on doxycycline which we will continue for now. Anticipate total of 5-7 days tx, anticipate 4 more days of abx on discharge -Remains afebrile  3. Obstructive sleep apnea -Continue with BiPAP nightly.  4. Paroxysmal atrial fibrillation -Continue Cardizem and Bystolic for rate control.  -Coumadin for anticoagulation. -INR stable, reviewed with patient  5. MS -No MS flare noted on MRI of spine.  -Brain MRI ordered, pending  6. Hypertension -Continue Cardizem and Bystolic, Flomax. -Remains stable at this time  7. Hypothyroidism -Continue Synthroid as pt tolerates  8. Chronic constipation -Continue with cathartics as needed  9. Gastroesophageal reflux disease -Continue on PPI. -Stable at this time  Discharge Instructions   Allergies as of 08/08/2019      Reactions   Hydrocodone Nausea And Vomiting   Hydrocodone causes  vomiting    Oxycodone Nausea And Vomiting   Oral oxycodone causes vomiting   Chlorhexidine Gluconate Rash   Sores at site   Zoloft [sertraline] Hives, Swelling, Rash      Medication List    TAKE these medications   acetaminophen 500 MG tablet Commonly known as: TYLENOL Take 500-1,000 mg by mouth every 6 (six) hours as needed for fever or headache (or pain).   Acthar 80 UNIT/ML injectable gel Generic drug: corticotropin INJECT 80 UNITS (1ML) DAILY FOR 10 DAYS. DUE TO MS EXACERBATION AND THEN CAN RESUME NEXT MONTH AT PREVIOUS DOSE.   ALPRAZolam 0.5 MG tablet Commonly known as: XANAX TAKE 1 TABLET BY MOUTH AT BEDTIME AS NEEDED FOR ANXIETY What changed: See the new instructions.   baclofen 20 MG tablet Commonly known as: LIORESAL Once to two tablets a day for breakthrough spasms What changed:   how much to take  how to take this  when to take this  reasons to take this  additional instructions   buPROPion 300 MG 24 hr tablet Commonly known as: Wellbutrin XL Take 1 tablet (300 mg total) by mouth daily.   Bystolic 5 MG tablet Generic drug: nebivolol Take 5 mg by mouth daily.   CALCIUM CITRATE PO Take 2 tablets by mouth 2 (two) times daily.   CENTRUM SILVER PO Take 1 tablet by mouth daily.   cetirizine 10 MG tablet Commonly known as: ZYRTEC Take 10 mg by mouth daily as needed for allergies.   co-enzyme Q-10 30 MG capsule Take 30 mg by mouth daily.   Cranberry 1000 MG Caps Take 2 capsules by mouth 2 (two) times daily.   diltiazem 180 MG 24 hr capsule Commonly known as: CARDIZEM CD TAKE 1 CAPSULE BY MOUTH  DAILY What changed:   how much to take  how to take this  when to take this  additional instructions   docusate sodium 100 MG capsule Commonly known as: COLACE Take 1 capsule (100 mg total) by mouth daily.   doxycycline 100 MG tablet Commonly known as: VIBRA-TABS Take 1 tablet (100 mg total) by mouth every 12 (twelve) hours for 4 days.   famotidine 20  MG tablet Commonly known as: PEPCID Take 20 mg by mouth at bedtime.   Fish Oil 1000 MG Caps Take 1,000 mg by mouth daily.   fluticasone 50 MCG/ACT nasal spray Commonly known as: FLONASE Place 2 sprays into both nostrils daily as needed for allergies.   furosemide 40 MG tablet Commonly known as: LASIX Take 20 mg by mouth 2 (two) times daily.   gabapentin 300 MG capsule Commonly known as: NEURONTIN TAKE 1 CAPSULE BY MOUTH UP  TO 4 TIMES DAILY What changed: See the new instructions.   Generlac 10 GM/15ML Soln Generic drug: lactulose (encephalopathy) TAKE 15 ML BY MOUTH TWICE DAILY AS NEEDED What changed: See the new instructions.   HYDROcodone-acetaminophen 5-325 MG tablet Commonly known as: NORCO/VICODIN Take 1 tablet by mouth every 6 (six) hours as needed for moderate pain or severe pain.   levothyroxine 75 MCG tablet Commonly known as: SYNTHROID Take 75 mcg by mouth daily.   Melatonin 3 MG Tabs Take 1 tablet (3 mg total) by mouth at bedtime as needed (sleep).   omeprazole 20 MG capsule Commonly known as: PRILOSEC Take 20 mg by mouth daily.   PA Biotin 1000 MCG tablet Generic drug: Biotin Take 1,000 mcg by mouth daily.   psyllium 28 % packet Commonly known as: METAMUCIL SMOOTH TEXTURE  Take 1 packet by mouth daily.   simvastatin 10 MG tablet Commonly known as: ZOCOR Take 10 mg by mouth every evening.   SYRINGE/NEEDLE (DISP) 1 ML 25G X 5/8" 1 ML Misc Commonly known as: BD Luer-Lok Syringe Use as directed to inject  Acthar   tamsulosin 0.4 MG Caps capsule Commonly known as: FLOMAX Take 0.4 mg by mouth daily.   Vitamin D 50 MCG (2000 UT) tablet Take 1 tablet (2,000 Units total) by mouth 2 (two) times daily.   warfarin 5 MG tablet Commonly known as: COUMADIN Take as directed. If you are unsure how to take this medication, talk to your nurse or doctor. Original instructions: TAKE 1 TABLET BY MOUTH  DAILY AS DIRECTED BY  COUMADIN CLINIC What changed: See the  new instructions.      Contact information for after-discharge care    Destination    [facility] .   Service: Skilled Nursing Contact information: [place] [id number] [phone]             Allergies  Allergen Reactions  . Hydrocodone Nausea And Vomiting    Hydrocodone causes vomiting  . Oxycodone Nausea And Vomiting    Oral oxycodone causes vomiting  . Chlorhexidine Gluconate Rash    Sores at site  . Zoloft [Sertraline] Hives, Swelling and Rash   Procedures/Studies: DG Sacrum/Coccyx  Result Date: 07/31/2019 CLINICAL DATA:  Buttock pain. Fall 1 week ago. Hip and coccyx pain. EXAM: SACRUM AND COCCYX - 2+ VIEW COMPARISON:  Concurrent pelvic and right hip radiographs. Reformats from abdominopelvic CT 02/25/2019. FINDINGS: Please note examination is limited due to soft tissue attenuation from habitus. Cortical irregularity at the sacrococcygeal junction on the lateral view, not well assessed due to habitus. Sacral ala are grossly maintained. The sacroiliac joints are congruent with degenerative change. IMPRESSION: Cortical irregularity at the sacrococcygeal junction on the lateral view, not well assessed due to habitus. High parents of normal in this region, however this is not definitively seen on prior exam and suspicious for nondisplaced fracture. Electronically Signed   By: Keith Rake M.D.   On: 07/31/2019 23:30   MR BRAIN WO CONTRAST  Result Date: 08/06/2019 CLINICAL DATA:  Rule out CVA. Focal neuro deficit. History of MS. EXAM: MRI HEAD WITHOUT CONTRAST TECHNIQUE: Multiplanar, multiecho pulse sequences of the brain and surrounding structures were obtained without intravenous contrast. COMPARISON:  CT head 08/06/2016, MRI 08/08/2016 FINDINGS: Brain: Ventricle size normal. Negative for acute infarct. Periventricular white matter hyperintensity similar to 2017. Pattern is consistent with multiple sclerosis. Negative for  hemorrhage or mass. Fat is seen floating in the frontal horns of the ventricles bilaterally and also in the posterior fossa, unchanged from the prior study. Vascular: Normal arterial flow voids Skull and upper cervical spine: Negative Sinuses/Orbits: Paranasal sinuses clear. Bilateral cataract surgery Other: None IMPRESSION: No acute abnormality and no change from the prior study. 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 Electronically Signed   By: Franchot Gallo M.D.   On: 08/06/2019 17:29   MR CERVICAL SPINE W WO CONTRAST  Result Date: 08/04/2019 CLINICAL DATA:  Increasing right leg weakness and numbness, right buttock pain; history of multiple sclerosis EXAM: MRI CERVICAL SPINE WITHOUT AND WITH CONTRAST TECHNIQUE: Multiplanar and multiecho pulse sequences of the cervical spine, to include the craniocervical junction and cervicothoracic junction, were obtained without and  with intravenous contrast. CONTRAST:  30mL GADAVIST GADOBUTROL 1 MMOL/ML IV SOLN COMPARISON:  2011 FINDINGS: Alignment: Stable. Vertebrae: Stable vertebral body heights. There is anterior fusion from C3-C7 with plate and screw fixation and well incorporated interbody grafts. The hardware is not well evaluated on this study and there is associated susceptibility artifact. No significant marrow edema. Cord: Possible unchanged cord T2 hyperintensity on the right at C6-C7. No definite new abnormal signal with suboptimal evaluation due to artifact. No abnormal enhancement. Posterior Fossa, vertebral arteries, paraspinal tissues: Unremarkable. Disc levels: Suboptimal evaluation due to artifact. C2-C3: Small central disc protrusion. Facet hypertrophy, left greater than right. Probable minor canal stenosis and no significant foraminal stenosis. C3-C4: Operative level. Bridging bone and endplate  osteophytes. Probable mild canal stenosis and no significant foraminal stenosis. C4-C5: Operative level. Bridging bone and endplate osteophytes. Uncovertebral and facet hypertrophy. Probable mild canal stenosis and at least mild foraminal stenosis. C5-C6: Operative level. Bridging bone and endplate osteophytes. Facet and uncovertebral hypertrophy. Probable minor canal and foraminal stenosis. C6-C7: Operative level. Bridging bone and endplate osteophytes. No significant canal or foraminal stenosis. C7-T1: Small disc extrusion extending slightly below the disc level. Facet hypertrophy. No significant canal or foraminal stenosis. IMPRESSION: No definite new cord lesion or abnormal enhancement with suboptimal evaluation due to artifact. Postoperative and degenerative changes as detailed above. Electronically Signed   By: Macy Mis M.D.   On: 08/04/2019 09:07   MR THORACIC SPINE W WO CONTRAST  Result Date: 08/04/2019 CLINICAL DATA:  Fall 3 weeks ago, increasing right leg weakness and numbness; history of multiple sclerosis EXAM: MRI THORACIC WITHOUT AND WITH CONTRAST TECHNIQUE: Multiplanar and multiecho pulse sequences of the thoracic spine were obtained without and with intravenous contrast. CONTRAST:  49mL GADAVIST GADOBUTROL 1 MMOL/ML IV SOLN COMPARISON:  None. FINDINGS: MRI THORACIC SPINE FINDINGS Alignment:  Anteroposterior alignment is maintained. Vertebrae: Vertebral body heights are preserved. There are several vertebral body hemangiomas with the largest involving the left T6 pedicle and the T10 vertebral body. Cord: Suboptimal evaluation due to motion artifact. Patchy foci of cord T2 hyperintensity likely reflecting chronic demyelination given history. Thoracic cord appears mildly atrophic. No abnormal enhancement. Paraspinal and other soft tissues: Unremarkable. Disc levels: Mild multilevel degenerative disc disease and facet arthropathy with ligamentum flavum thickening. For example, small central  disc protrusion at T4-T5 and right paracentral disc extrusion extending slightly above disc level at T12-L1. There is no significant degenerative canal or neural foraminal stenosis. IMPRESSION: Patchy foci of cord T2 hyperintensity likely reflecting chronic demyelination given history. There is no definite abnormal enhancement. Mild degenerative changes. Electronically Signed   By: Macy Mis M.D.   On: 08/04/2019 09:48   MR Lumbar Spine W Wo Contrast  Result Date: 08/04/2019 CLINICAL DATA:  Fall 3 weeks ago, increasing right leg weakness and numbness; history of multiple sclerosis EXAM: MRI LUMBAR SPINE WITHOUT AND WITH CONTRAST TECHNIQUE: Multiplanar and multiecho pulse sequences of the lumbar spine were obtained without and with intravenous contrast. CONTRAST:  63mL GADAVIST GADOBUTROL 1 MMOL/ML IV SOLN COMPARISON:  Dec 28, 2016 FINDINGS: Segmentation: For the purposes of this dictation, the most caudal lumbar type vertebral body is designated L5. Alignment: There is grade 1 anterolisthesis at L4-L5 similar to the prior study. Vertebrae: Lumbar vertebral body heights are maintained apart from degenerative endplate irregularity and Schmorl's nodes. There is sacral marrow edema particularly at S2 and S3 as well as within the imaged iliac bones bilaterally. Conus medullaris and cauda equina: Conus extends to the  L1 level. Conus and cauda equina appear normal. Paraspinal and other soft tissues: Presacral fluid is noted. There is mild soft tissue edema surrounding the sacrum. Disc levels: L1-L2: Very small left central disc protrusion. No significant canal or foraminal stenosis. Appearance is similar. L2-L3: Disc bulge with small left central disc protrusion. Mild facet arthropathy with ligamentum flavum infolding. No significant canal stenosis. Mild right and minor left foraminal stenosis. Appearance is similar. L3-L4: Disc bulge and marked facet arthropathy with ligamentum flavum infolding. Similar mild to  moderate canal stenosis with partial effacement of the lateral recesses. Slightly increased mild right foraminal stenosis. Similar mild left foraminal stenosis. L4-L5: Anterolisthesis with uncovering of disc bulge. Marked facet arthropathy with ligamentum flavum infolding. Similar moderate canal stenosis with narrowing of the lateral recesses. Similar moderate right and moderate to marked left foraminal stenosis. L5-S1: Disc bulge and moderate facet arthropathy. No significant canal stenosis. Similar mild, right greater than left foraminal stenosis. IMPRESSION: Partially imaged sacral and bilateral iliac marrow edema. This is probably post-traumatic, noting possible fracture on prior imaging. Multilevel degenerative changes as detailed above. Appearance is similar to the 2018 study. Slightly increased right foraminal stenosis at L3-L4. Electronically Signed   By: Macy Mis M.D.   On: 08/04/2019 09:41   CT ABDOMEN PELVIS W CONTRAST  Result Date: 08/04/2019 CLINICAL DATA:  Fall 3 weeks ago. Persistent right buttock pain. Pain radiates to both hips. Evaluate for intra-abdominal/retroperitoneal hematoma. On anticoagulation. EXAM: CT ABDOMEN AND PELVIS WITH CONTRAST TECHNIQUE: Multidetector CT imaging of the abdomen and pelvis was performed using the standard protocol following bolus administration of intravenous contrast. CONTRAST:  136mL OMNIPAQUE IOHEXOL 300 MG/ML  SOLN COMPARISON:  Abdominopelvic CT 02/25/2019 05/11/2014 FINDINGS: Lower chest: No focal airspace disease. Trace left pleural thickening without frank effusion. Borderline cardiomegaly. No basilar rib fracture. Previous right middle lobe pulmonary nodules not included in the field of view. Hepatobiliary: No focal hepatic abnormality or hepatic injury. No perihepatic hematoma. Gallbladder physiologically distended, no calcified stone. No biliary dilatation. Pancreas: No ductal dilatation or inflammation. Spleen: Normal in size without focal  abnormality. No evidence of splenic injury. Adrenals/Urinary Tract: Normal adrenal glands. No hydronephrosis. The enhancing left renal lesion in the upper pole is less well visualized on the current exam, however appears grossly unchanged in size measuring approximately 16 mm, series 5, image 134. Additional simple cyst in the lower left kidney is again seen. No hydronephrosis. No evidence of renal injury. Symmetric excretion on delayed phase imaging. Urinary bladder is distended without wall thickening. Stomach/Bowel: Ingested material in the stomach. No bowel wall thickening or evidence of injury. No mesenteric hematoma. There is fecalization of distal small bowel contents. Normal appendix. Large volume of stool throughout the colon with colonic tortuosity. No mesenteric hematoma. Vascular/Lymphatic: Mild aortic atherosclerosis. No evidence of aortic injury. IVC is intact. No retroperitoneal fluid. No retroperitoneal stranding or evidence of retroperitoneal bleed. No adenopathy. Reproductive: Uterus is unremarkable. No suspicious adnexal mass. Slight right ovarian prominence is unchanged from prior. Other: No free fluid or evidence intra-abdominal bleed. No retroperitoneal hemorrhage. No free air. Minimal patchy subcutaneous edema of the right posterior flank without confluent hematoma. Musculoskeletal: Spinal stimulator in place with battery pack in the right anterior abdominal wall. Minimal cortical buckling of the mid sacrum may represent a nondisplaced fracture, series 5, image 100. No other fracture of the lumbar spine or pelvis. Multilevel degenerative change throughout the lumbar spine. IMPRESSION: 1. Minimal cortical buckling of the mid sacrum may represent a nondisplaced fracture.  2. Minimal patchy subcutaneous edema of the right posterior flank without confluent hematoma. No evidence of intra-abdominal or retroperitoneal bleed. No evidence of acute intra-abdominal/pelvic injury. 3. Enhancing left renal  lesion measuring 16 mm, unchanged from abdominal CT 02/25/2019. 4. Large volume of stool throughout the colon with colonic tortuosity in fecalization of distal small bowel contents consistent with constipation and slow bowel transit. Aortic Atherosclerosis (ICD10-I70.0). Electronically Signed   By: Keith Rake M.D.   On: 08/04/2019 04:06   DG Hip Unilat W or Wo Pelvis 2-3 Views Right  Result Date: 07/31/2019 CLINICAL DATA:  Buttock pain. Fall 1 week ago. Hip and coccyx pain. EXAM: DG HIP (WITH OR WITHOUT PELVIS) 2-3V RIGHT COMPARISON:  Concurrent sacrum and coccyx radiographs FINDINGS: The cortical margins of the bony pelvis and right hip are intact. Moderate bilateral hip osteoarthritis. The questioned sacrococcygeal fracture is not well assessed on the current exam. Pubic symphysis and sacroiliac joints are congruent. Both femoral heads are well-seated in the respective acetabula. Stimulator with battery pack in the right. IMPRESSION: No evidence of pelvic or right hip fracture. The questioned of sacrococcygeal fracture dedicated coccyx exam is not well assessed on the current exam. Electronically Signed   By: Keith Rake M.D.   On: 07/31/2019 23:32     Subjective: Without complaints this AM  Discharge Exam: Vitals:   08/08/19 0535 08/08/19 1300  BP: 126/90 (!) 103/48  Pulse: 74 82  Resp: 18 18  Temp: 98.4 F (36.9 C) 98.4 F (36.9 C)  SpO2: 96% 100%   Vitals:   08/07/19 2027 08/07/19 2118 08/08/19 0535 08/08/19 1300  BP:  100/62 126/90 (!) 103/48  Pulse: 82 80 74 82  Resp: 15 18 18 18   Temp:  99.4 F (37.4 C) 98.4 F (36.9 C) 98.4 F (36.9 C)  TempSrc:    Oral  SpO2: 99% 97% 96% 100%  Weight:      Height:        General: Pt is alert, awake, not in acute distress Cardiovascular: RRR, S1/S2 +, no rubs, no gallops Respiratory: CTA bilaterally, no wheezing, no rhonchi Abdominal: Soft, NT, ND, bowel sounds + Extremities: no edema, no cyanosis  The results of  significant diagnostics from this hospitalization (including imaging, microbiology, ancillary and laboratory) are listed below for reference.     Microbiology: Recent Results (from the past 240 hour(s))  Urine culture     Status: Abnormal   Collection Time: 07/31/19 10:28 PM   Specimen: Urine, Clean Catch  Result Value Ref Range Status   Specimen Description   Final    Urine Performed at [facility] [address] [place], [place] [address]    Special Requests   Final    NONE Performed at [facility], [place] [address] [place], [place] [address]    Culture >=100,000 COLONIES/mL STAPHYLOCOCCUS SIMULANS (A)  Final   Report Status 08/03/2019 FINAL  Final   Organism ID, Bacteria STAPHYLOCOCCUS SIMULANS (A)  Final      Susceptibility   Staphylococcus simulans - MIC*    CIPROFLOXACIN >=8 RESISTANT Resistant     ERYTHROMYCIN <=0.25 SENSITIVE Sensitive     GENTAMICIN <=0.5 SENSITIVE Sensitive     OXACILLIN 1 RESISTANT Resistant     TETRACYCLINE <=1 SENSITIVE Sensitive     VANCOMYCIN <=0.5 SENSITIVE Sensitive     TRIMETH/SULFA <=10 SENSITIVE Sensitive     CLINDAMYCIN <=0.25 SENSITIVE Sensitive     RIFAMPIN <=0.5 SENSITIVE Sensitive  Inducible Clindamycin NEGATIVE Sensitive     * >=100,000 COLONIES/mL STAPHYLOCOCCUS SIMULANS  SARS CORONAVIRUS 2 (TAT 6-24 HRS) Nasopharyngeal Nasopharyngeal Swab     Status: None   Collection Time: 08/04/19  3:24 AM   Specimen: Nasopharyngeal Swab  Result Value Ref Range Status   SARS Coronavirus 2 NEGATIVE NEGATIVE Final    Comment: (NOTE) SARS-CoV-2 target nucleic acids are NOT DETECTED. The SARS-CoV-2 RNA is generally detectable in upper and lower respiratory specimens during the acute phase of infection. Negative results do not preclude SARS-CoV-2 infection, do not rule out co-infections with other pathogens, and should not be used as the sole basis for treatment or other patient management  decisions. Negative results must be combined with clinical observations, patient history, and epidemiological information. The expected result is Negative. Fact Sheet for Patients: SugarRoll.be Fact Sheet for Healthcare Providers: https://www.woods-mathews.com/ This test is not yet approved or cleared by the [place] FDA and  has been authorized for detection and/or diagnosis of SARS-CoV-2 by FDA under an Emergency Use Authorization (EUA). This EUA will remain  in effect (meaning this test can be used) for the duration of the COVID-19 declaration under Section 56 4(b)(1) of the Act, 21 U.S.C. section 360bbb-3(b)(1), unless the authorization is terminated or revoked sooner. Performed at [facility], [place] [address], [place], [place] [address]   Culture, Urine     Status: Abnormal   Collection Time: 08/04/19  3:24 AM   Specimen: Urine, Catheterized  Result Value Ref Range Status   Specimen Description   Final    URINE, CATHETERIZED Performed at [facility] [address], [place], [place] [address]    Special Requests   Final    NONE Performed at [facility] [facility], [place] [address], [place], [place] [address]    Culture MULTIPLE SPECIES PRESENT, SUGGEST RECOLLECTION (A)  Final   Report Status 08/05/2019 FINAL  Final  SARS CORONAVIRUS 2 (TAT 6-24 HRS) Nasopharyngeal Nasopharyngeal Swab     Status: None   Collection Time: 08/06/19  2:22 PM   Specimen: Nasopharyngeal Swab  Result Value Ref Range Status   SARS Coronavirus 2 NEGATIVE NEGATIVE Final    Comment: (NOTE) SARS-CoV-2 target nucleic acids are NOT DETECTED. The SARS-CoV-2 RNA is generally detectable in upper and lower respiratory specimens during the acute phase of infection. Negative results do not preclude SARS-CoV-2 infection, do not rule out co-infections with other pathogens, and should not be used as the sole basis for treatment or  other patient management decisions. Negative results must be combined with clinical observations, patient history, and epidemiological information. The expected result is Negative. Fact Sheet for Patients: SugarRoll.be Fact Sheet for Healthcare Providers: https://www.woods-mathews.com/ This test is not yet approved or cleared by the [place] FDA and  has been authorized for detection and/or diagnosis of SARS-CoV-2 by FDA under an Emergency Use Authorization (EUA). This EUA will remain  in effect (meaning this test can be used) for the duration of the COVID-19 declaration under Section 56 4(b)(1) of the Act, 21 U.S.C. section 360bbb-3(b)(1), unless the authorization is terminated or revoked sooner. Performed at [facility], [place] [address] [place], [place] [address]      Labs: BNP (last 3 results) No results for input(s): BNP in the last 8760 hours. Basic Metabolic Panel: Recent Labs  Lab 08/04/19 0116 08/05/19 0547 08/06/19 0540  NA 140 140 140  K 4.0 3.6 3.9  CL 100 101 100  CO2 31 30  32  GLUCOSE 120* 79 93  BUN 23 19 22   CREATININE 0.68 0.52 0.56  CALCIUM 9.6 8.3* 8.6*   Liver Function Tests: No results for input(s): AST, ALT, ALKPHOS, BILITOT, PROT, ALBUMIN in the last 168 hours. No results for input(s): LIPASE, AMYLASE in the last 168 hours. No results for input(s): AMMONIA in the last 168 hours. CBC: Recent Labs  Lab 08/04/19 0116 08/05/19 0547 08/06/19 0540  WBC 12.8* 9.8 8.9  NEUTROABS 9.1* 5.6  --   HGB 14.2 13.6 13.5  HCT 43.5 42.2 41.7  MCV 94.8 96.8 95.9  PLT 228 186 190   Cardiac Enzymes: No results for input(s): CKTOTAL, CKMB, CKMBINDEX, TROPONINI in the last 168 hours. BNP: Invalid input(s): POCBNP CBG: No results for input(s): GLUCAP in the last 168 hours. D-Dimer No results for input(s): DDIMER in the last 72 hours. Hgb A1c No results for input(s): HGBA1C in the last 72 hours. Lipid  Profile No results for input(s): CHOL, HDL, LDLCALC, TRIG, CHOLHDL, LDLDIRECT in the last 72 hours. Thyroid function studies No results for input(s): TSH, T4TOTAL, T3FREE, THYROIDAB in the last 72 hours.  Invalid input(s): FREET3 Anemia work up No results for input(s): VITAMINB12, FOLATE, FERRITIN, TIBC, IRON, RETICCTPCT in the last 72 hours. Urinalysis    Component Value Date/Time   COLORURINE YELLOW 08/04/2019 0324   APPEARANCEUR TURBID (A) 08/04/2019 0324   APPEARANCEUR Clear 12/18/2014 1156   LABSPEC >1.046 (H) 08/04/2019 0324   PHURINE 5.0 08/04/2019 0324   GLUCOSEU NEGATIVE 08/04/2019 0324   HGBUR SMALL (A) 08/04/2019 0324   BILIRUBINUR NEGATIVE 08/04/2019 0324   BILIRUBINUR Negative 12/18/2014 1156   KETONESUR NEGATIVE 08/04/2019 0324   PROTEINUR 100 (A) 08/04/2019 0324   UROBILINOGEN 0.2 08/05/2014 0951   NITRITE NEGATIVE 08/04/2019 0324   LEUKOCYTESUR LARGE (A) 08/04/2019 0324   Sepsis Labs Invalid input(s): PROCALCITONIN,  WBC,  LACTICIDVEN Microbiology Recent Results (from the past 240 hour(s))  Urine culture     Status: Abnormal   Collection Time: 07/31/19 10:28 PM   Specimen: Urine, Clean Catch  Result Value Ref Range Status   Specimen Description   Final    Urine Performed at [facility], [place] [address] [place], [place] [address]    Special Requests   Final    NONE Performed at [facility], [place] [address], [place], [place] [address]    Culture >=100,000 COLONIES/mL STAPHYLOCOCCUS SIMULANS (A)  Final   Report Status 08/03/2019 FINAL  Final   Organism ID, Bacteria STAPHYLOCOCCUS SIMULANS (A)  Final      Susceptibility   Staphylococcus simulans - MIC*    CIPROFLOXACIN >=8 RESISTANT Resistant     ERYTHROMYCIN <=0.25 SENSITIVE Sensitive     GENTAMICIN <=0.5 SENSITIVE Sensitive     OXACILLIN 1 RESISTANT Resistant     TETRACYCLINE <=1 SENSITIVE Sensitive     VANCOMYCIN <=0.5 SENSITIVE Sensitive     TRIMETH/SULFA <=10  SENSITIVE Sensitive     CLINDAMYCIN <=0.25 SENSITIVE Sensitive     RIFAMPIN <=0.5 SENSITIVE Sensitive     Inducible Clindamycin NEGATIVE Sensitive     * >=100,000 COLONIES/mL STAPHYLOCOCCUS SIMULANS  SARS CORONAVIRUS 2 (TAT 6-24 HRS) Nasopharyngeal Nasopharyngeal Swab     Status: None   Collection Time: 08/04/19  3:24 AM   Specimen: Nasopharyngeal Swab  Result Value Ref Range Status   SARS Coronavirus 2 NEGATIVE NEGATIVE Final    Comment: (NOTE) SARS-CoV-2 target nucleic acids are NOT DETECTED. The SARS-CoV-2 RNA is generally detectable in  upper and lower respiratory specimens during the acute phase of infection. Negative results do not preclude SARS-CoV-2 infection, do not rule out co-infections with other pathogens, and should not be used as the sole basis for treatment or other patient management decisions. Negative results must be combined with clinical observations, patient history, and epidemiological information. The expected result is Negative. Fact Sheet for Patients: SugarRoll.be Fact Sheet for Healthcare Providers: https://www.woods-mathews.com/ This test is not yet approved or cleared by the [place] FDA and  has been authorized for detection and/or diagnosis of SARS-CoV-2 by FDA under an Emergency Use Authorization (EUA). This EUA will remain  in effect (meaning this test can be used) for the duration of the COVID-19 declaration under Section 56 4(b)(1) of the Act, 21 U.S.C. section 360bbb-3(b)(1), unless the authorization is terminated or revoked sooner. Performed at [facility], [place] [address] [place], [place] [address]   Culture, Urine     Status: Abnormal   Collection Time: 08/04/19  3:24 AM   Specimen: Urine, Catheterized  Result Value Ref Range Status   Specimen Description   Final    URINE, CATHETERIZED Performed at [place] [address] [place], [place] [address]    Special  Requests   Final    NONE Performed at [facility], [place] [address], [place], [place] [address]    Culture MULTIPLE SPECIES PRESENT, SUGGEST RECOLLECTION (A)  Final   Report Status 08/05/2019 FINAL  Final  SARS CORONAVIRUS 2 (TAT 6-24 HRS) Nasopharyngeal Nasopharyngeal Swab     Status: None   Collection Time: 08/06/19  2:22 PM   Specimen: Nasopharyngeal Swab  Result Value Ref Range Status   SARS Coronavirus 2 NEGATIVE NEGATIVE Final    Comment: (NOTE) SARS-CoV-2 target nucleic acids are NOT DETECTED. The SARS-CoV-2 RNA is generally detectable in upper and lower respiratory specimens during the acute phase of infection. Negative results do not preclude SARS-CoV-2 infection, do not rule out co-infections with other pathogens, and should not be used as the sole basis for treatment or other patient management decisions. Negative results must be combined with clinical observations, patient history, and epidemiological information. The expected result is Negative. Fact Sheet for Patients: SugarRoll.be Fact Sheet for Healthcare Providers: https://www.woods-mathews.com/ This test is not yet approved or cleared by the [place] FDA and  has been authorized for detection and/or diagnosis of SARS-CoV-2 by FDA under an Emergency Use Authorization (EUA). This EUA will remain  in effect (meaning this test can be used) for the duration of the COVID-19 declaration under Section 56 4(b)(1) of the Act, 21 U.S.C. section 360bbb-3(b)(1), unless the authorization is terminated or revoked sooner. Performed at [facility], [place] [address] [place], [place] [address]    Time spent: 30 min  SIGNED:   Marylu Lund, MD  [facility] 08/08/2019, 2:06 PM  If 7PM-7AM, please contact night-coverage

## 2019-08-08 NOTE — Progress Notes (Signed)
ANTICOAGULATION CONSULT NOTE -   Pharmacy Consult for warfarin Indication: atrial fibrillation  Allergies  Allergen Reactions  . Hydrocodone Nausea And Vomiting    Hydrocodone causes vomiting  . Oxycodone Nausea And Vomiting    Oral oxycodone causes vomiting  . Chlorhexidine Gluconate Rash    Sores at site  . Zoloft [Sertraline] Hives, Swelling and Rash   Patient Measurements: Height: 5\' 3"  (160 cm) Weight: 189 lb (85.7 kg) IBW/kg (Calculated) : 52.4  Vital Signs: Temp: 98.4 F (36.9 C) (12/18 0535) BP: 126/90 (12/18 0535) Pulse Rate: 74 (12/18 0535)  Labs: Recent Labs    08/06/19 0540 08/07/19 0548 08/08/19 0458  HGB 13.5  --   --   HCT 41.7  --   --   PLT 190  --   --   LABPROT 28.4* 24.0* 26.1*  INR 2.7* 2.2* 2.4*  CREATININE 0.56  --   --     Estimated Creatinine Clearance: 65.9 mL/min (by C-G formula based on SCr of 0.56 mg/dL).   Medical History: Past Medical History:  Diagnosis Date  . Abnormality of gait 03/01/2015  . Anxiety   . Arthritis    "knees" (01/19/2016)  . Atrial fibrillation with RVR ([place]) 01/19/2016  . Back pain   . Chest pain   . Childhood asthma   . Chronic pain    "nerve pain from the MS"  . Constipation   . Depression   . DVT (deep venous thrombosis) ([place]) 1983   "RLE; may have just been phlebitis; it was before the age of dopplers"  . 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 GERD (gastroesophageal reflux disease)   . History of stress test 06/21/10   limited exam with some degree of breast attenuatin of the apex however a component of apical ischemia is not excluded, EF 62%; cardiac cath   . HLD (hyperlipidemia)   . Hx of echocardiogram 04/22/10   EF 55-60%, no valve issues  . Hypertension   . Hypothyroidism   . Joint pain   . Migraine    "none since I stopped beta blocker in 1990s" (01/19/2016)  . Multiple sclerosis ([place])    has had this may 1989-[facility] reg doc  . OSA on  CPAP   . Osteoarthritis   . PAF (paroxysmal atrial fibrillation) ([place])    on coumadin; documented on monitor 06/2010  . Pneumonia 11/2011  . Shortness of breath   . Thyroid disease     Assessment: 12 yoF with extremity weakness on chronic warfarin for a-fib.  Home dose: 5 mg daily, 2.5 mg Fridays. LD = 12/13 2200, no dose given 12/14  08/08/2019  INR = 2.4 remains therapeutic  Hgb/Plt both WNL and stable  Patient eating 75% of meals  Drug interactions: Pt on doxycycline which may enhance the anticoagulant effect of warfarin.  Goal of Therapy:  INR 2-3   Plan:   Warfarin 2.5 mg PO this evening. Continue home dose  INR daily while inpatient and on antibiotics  Pt will require more frequent INR monitoring while on antibiotics and at completion of ABX course.   Current discharge recommendation: Continue home dose of warfarin with INR check within 48 hours of discharge for further dose guidance.  Lenis Noon, PharmD 08/08/19 10:42 AM

## 2019-08-08 NOTE — Progress Notes (Signed)
Assumed care of this Pt from previous RN. Pt resting comfortably in bed in stable condition at this time. Will continue to monitor closely.

## 2019-08-08 NOTE — Care Management Important Message (Signed)
Important Message  Patient Details IM Letter given to Dessa Phi RN Case Manager to present to the Patient Name: Toni [name] MRN: [id number] Date of Birth: Sep 11, 1945   Medicare Important Message Given:  Yes     Kerin Salen 08/08/2019, 10:48 AM

## 2019-08-08 NOTE — Plan of Care (Signed)
Completed.

## 2019-08-08 NOTE — TOC Transition Note (Signed)
Transition of Care [facility]) - CM/SW Discharge Note   Patient Details  Name: Toni [name] MRN: [id number] Date of Birth: 1946/03/02  [facility] CM/SW Contact:  Dessa Phi, RN Phone Number: 08/08/2019, 12:29 PM   Clinical Narrative:Patient/spouse chose [facility]- Has bed @ [facility] rep Irine Seal. Per patient/spouse-they have a med Acthar they provide-facility is aware. PTAR for transport.await dc summary to send to facility. Await rm tel# for report.    Final next level of care: Skilled Nursing Facility Barriers to Discharge: No Barriers Identified   Patient Goals and CMS Choice   CMS Medicare.gov Compare Post Acute Care list provided to:: Patient    Discharge Placement   Existing PASRR number confirmed : 08/08/19          Patient [facility] bed at: [facility] Patient to be transferred to facility by: [place] Name of family member notified: ronald Patient and family notified of of transfer: 08/08/19  Discharge Plan and Services     Post Acute Care Choice: [place]                               Social Determinants of Health (SDOH) Interventions     Readmission Risk Interventions No flowsheet data found.

## 2019-08-08 NOTE — TOC Transition Note (Signed)
Transition of Care [facility]) - CM/SW Discharge Note   Patient Details  Name: Toni [name] MRN: [id number] Date of Birth: 1945-10-12  Transition of [facility]) CM/SW Contact:  Dessa Phi, RN Phone Number: 08/08/2019, 3:37 PM   Clinical Narrative: [facility] called for transport going to [facility],nurse call report to tel#[phone]-Nsg aware. No further CM needs.       Final next level of care: Skilled Nursing Facility Barriers to Discharge: No Barriers Identified   Patient Goals and CMS Choice   CMS Medicare.gov Compare Post Acute Care list provided to:: Patient    Discharge Placement   Existing PASRR number confirmed : 08/08/19          Patient [facility] bed at: [facility] Patient to be transferred to facility by: [place] Name of family member notified: ronald Patient and family notified of of transfer: 08/08/19  Discharge Plan and Services     Post Acute Care Choice: [place]                               Social Determinants of Health (SDOH) Interventions     Readmission Risk Interventions No flowsheet data found.

## 2019-08-11 NOTE — Telephone Encounter (Signed)
I called the patient and talk with the daughter.  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  The patient apparently has 5 days of Acthar each month, she may need a 10-day course for this exacerbation.  She is at [facility], if they need me to call in orders, they will let me know.  The patient has her own medication.

## 2019-08-11 NOTE — Telephone Encounter (Signed)
Called the patient's daughter and spoke in detail with her in regards to pt care and what has been going on.  wkend before the 10 th of Dec she fell at home in between transferring. On the 10th she went to ED due to pain she was having on her bottom and they did blood work and scan and at that time was negative for a UTI and they suspected hairline fx of tailbone. DC home. On 12/13 she woke up and noticed increased in her weakness in extremities. She couldn't hold a fork, couldn't move her legs up and down, couldn't roll herself. They called EMS went to ED and there she was in ER for 48 hrs before getting in a room. They told her she had a "raging UTI" and they felt that this is what was flaring up her MS. (see labs c/o)  They did repeat imaging and don't see any new lesions. [facility].  Patient asked her daughter to update [name] because with the weakness that she is having in comparison to 3 wks ago when she was working with outpatient PT she has declined tremendously and is concerned this is a exacerbation.  My concern is that the patient is been having exacerbations more frequently. 3 in the last 4 mths and in which case we would increase the frequency of the acthar during that time frame and then she would resume it to be taken normally.  She had side effects to steroids previously.  I advised the daughter with the increased frequency of these flare ups I would like to discuss with Ward Givens and possibly ask Dr [name] since he is familiar with her care thoughts. I advised the daughter I would call her back.

## 2019-08-11 NOTE — Telephone Encounter (Signed)
Pt daughter [name] Witz(on DPR) has called to report that pt is at [facility], daughter is asking for a call from RN to discuss pt's MS exacerbation

## 2019-08-12 ENCOUNTER — Telehealth: Payer: Self-pay | Admitting: Neurology

## 2019-08-12 DIAGNOSIS — U071 COVID-19: Secondary | ICD-10-CM | POA: Diagnosis not present

## 2019-08-12 NOTE — Telephone Encounter (Signed)
I called the patient.  The patient may be able to get the vaccination for Covid 6 days from now.  The patient is being actively treated for bladder infection, if she has recovered well from this and is feeling better she will get the vaccination, if she does not believe that she is doing well, still has a lot of weakness and fatigue then I would hold off on the vaccination as this may result in some fevers and malaise and make her clinical condition worse.

## 2019-08-12 NOTE — Telephone Encounter (Signed)
Patient called to inform that her rehab facility will be providing COVID vaccines on Monday and she would like to know if there would be any concerns for her getting one.  Please follow up

## 2019-08-13 ENCOUNTER — Telehealth (INDEPENDENT_AMBULATORY_CARE_PROVIDER_SITE_OTHER): Payer: Medicare Other | Admitting: Family Medicine

## 2019-08-19 ENCOUNTER — Telehealth: Payer: Self-pay | Admitting: Neurology

## 2019-08-19 DIAGNOSIS — U071 COVID-19: Secondary | ICD-10-CM | POA: Diagnosis not present

## 2019-08-19 NOTE — Telephone Encounter (Signed)
Pt's daughter Marletta Lor on [place] called wanting to speak to RN or provider about her mother's care at [facility]. She will be on a conference call with them today from 10:30am-11:30am but will be free before or after that. Please advise.

## 2019-08-19 NOTE — Telephone Encounter (Signed)
Called the patient's daughter and she feels that she may have this situation settled but wanted to make [name] aware that her care at [facility] has been horrible. She has had assistance and she has reached out and hopefully she will be moving to [facility] soon. She wanted to note that she has had her covid vaccine and due to that she held off taking acthar to make sure they didn't interact. The daughter was initially just looking to see if Dr Brett Fairy had connections or someone that could help her get moved, but after having meeting with the staff at the facility she thinks they are moving in a good directions. She just wanted to update on everything. Informed I would make sure this is noted.

## 2019-08-20 ENCOUNTER — Telehealth: Payer: Self-pay | Admitting: Neurology

## 2019-08-20 NOTE — Telephone Encounter (Signed)
Called the patient daughter to advise that Dr. Brett Fairy states that it is ok for her to continue her acthar as she is scheduled. Patient's daughter will relay this information to the medical staff in case she needs it. As far as symptoms, she has been for the most part afebrile, her temp typically runs lower and for her to have a 98.8 that per her is mild fever. She has been having dizziness and nausea but initially they thought this was related to her low bp. They tested her using the rapid test and she was positive both time. They are waiting on the PCR test to come back.. the last time she was tested was 12/22 and that was negative. Unsure how she was exposed unless it was in the hospital and was late to show up.

## 2019-08-20 NOTE — Telephone Encounter (Signed)
Pt's daughter Marletta Lor on [place] called stating that the pt is COVID positive and she states that Ronney Lion is wanting to know if the pt can receive the corticotropin (ACTHAR) 80 UNIT/ML injectable gel when COVID positive. Please advise.

## 2019-08-20 NOTE — Telephone Encounter (Signed)
Yes, she can- is she symptomatic? That's when steroids help actually! No reason to to use ACTH .

## 2019-08-21 ENCOUNTER — Telehealth (INDEPENDENT_AMBULATORY_CARE_PROVIDER_SITE_OTHER): Payer: Medicare Other | Admitting: Physician Assistant

## 2019-08-21 VITALS — BP 110/77 | HR 94 | Temp 99.2°F | Resp 20 | Wt 180.0 lb

## 2019-08-21 DIAGNOSIS — Z7189 Other specified counseling: Secondary | ICD-10-CM | POA: Diagnosis not present

## 2019-08-21 DIAGNOSIS — I4821 Permanent atrial fibrillation: Secondary | ICD-10-CM

## 2019-08-21 DIAGNOSIS — I509 Heart failure, unspecified: Secondary | ICD-10-CM | POA: Diagnosis not present

## 2019-08-21 DIAGNOSIS — I110 Hypertensive heart disease with heart failure: Secondary | ICD-10-CM

## 2019-08-21 DIAGNOSIS — I872 Venous insufficiency (chronic) (peripheral): Secondary | ICD-10-CM

## 2019-08-21 DIAGNOSIS — I959 Hypotension, unspecified: Secondary | ICD-10-CM

## 2019-08-21 DIAGNOSIS — I951 Orthostatic hypotension: Secondary | ICD-10-CM

## 2019-08-21 DIAGNOSIS — I10 Essential (primary) hypertension: Secondary | ICD-10-CM

## 2019-08-21 DIAGNOSIS — I5032 Chronic diastolic (congestive) heart failure: Secondary | ICD-10-CM

## 2019-08-21 MED ORDER — FUROSEMIDE 20 MG PO TABS: 10.0000 mg | ORAL_TABLET | Freq: Two times a day (BID) | ORAL | Status: DC

## 2019-08-21 MED ORDER — DILTIAZEM HCL ER COATED BEADS 180 MG PO CP24: ORAL_CAPSULE | ORAL | 3 refills | Status: DC

## 2019-08-21 MED ORDER — BYSTOLIC 2.5 MG PO TABS: 2.5000 mg | ORAL_TABLET | Freq: Every day | ORAL | Status: DC

## 2019-08-21 NOTE — Progress Notes (Signed)
Virtual Visit via Telephone Note   This visit type was conducted due to national recommendations for restrictions regarding the COVID-19 Pandemic (e.g. social distancing) in an effort to limit this patient's exposure and mitigate transmission in our community.  Due to her co-morbid illnesses, this patient is at least at moderate risk for complications without adequate follow up.  This format is felt to be most appropriate for this patient at this time.  The patient did not have access to video technology/had technical difficulties with video requiring transitioning to audio format only (telephone).  All issues noted in this document were discussed and addressed.  No physical exam could be performed with this format.  Please refer to the patient's chart for her  consent to telehealth for [facility].  Evaluation Performed:  Follow-up visit  This visit type was conducted due to national recommendations for restrictions regarding the COVID-19 Pandemic (e.g. social distancing).  This format is felt to be most appropriate for this patient at this time.  All issues noted in this document were discussed and addressed.  No physical exam was performed (except for noted visual exam findings with Video Visits).  Please refer to the patient's chart (MyChart message for video visits and phone note for telephone visits) for the patient's consent to telehealth for [facility].  Date:  08/21/2019   ID:  Toni [name], DOB 12/07/1945, MRN [id number]  Patient Location:  [facility]  Provider location:   Office  PCP:  Prince Solian, MD  Cardiologist:  Pixie Casino, MD 03/10/2019 Electrophysiologist:  None   Chief Complaint:  Cardiology follow up  History of Present Illness:    Toni [name] is a 73 y.o. female who presents via audio/video conferencing for a telehealth visit today.  Toni [name], the head nurse at [facility] is assisting in the visit.  73 y.o. yo female who has a hx of  MS diagnosed in 1989, HTN, A.Fib on coumadin, nl cors 2011, venous insufficiency, OSA on BiPAP.   Pt admitted 12/13-12/18/2020 for fall (3 weeks earlier) w/ bilateral LE weakness and pain. CT w/ sacral fx, MRI w/out definite new lesion (+artifact) so not felt to be an MS flare, UTI>>doxy.  Since d/c, she has had problems w/ weakness, orthostatic sx. Her Bystolic was reduced from 5 mg daily down to 2.5 mg daily. The facility NP put parameters on the Cardizem, Bystolic and Lasix. Hold for SBP <110.   She began having problems with being lightheaded when working with physical therapy.  It was noted that her systolic blood pressure was in the 90s.  The Cardizem, Bystolic and Lasix were held because of the low blood pressure.  It was felt that she might be a little dehydrated and she was encouraged to drink more water which she did.  Her weight today at 180 pounds, is the lowest her weight has been in a while.  Her weight has been trending down over the past year and a half.  She has been working to lose the weight steadily with the help of a physician.  However, her blood pressure remained low and she did not get her Cardizem or Bystolic or Lasix for 4 days.  Without the rate control medications, her heart rate would go as high as 140 when she was working with physical therapy.  She is concerned that her blood pressure has been up as high as 0000000 systolic when she was in the hospital and is now  hanging in the 90s.  She is concerned that she is not getting her Cardizem or Bystolic for rate control.  She is concerned that her heart rate has been elevated.  She has not had chest pain or shortness of breath.  She has had some mild lower extremity edema, but denies orthopnea or PND.  A rapid swab was performed because of the weakness and low blood pressure that was positive for Covid.  A confirmatory test is pending.  However, she denies shortness of breath, cough or fever.  The patient does not have  symptoms concerning for COVID-19 infection (fever, chills, cough, or new shortness of breath).    Prior CV studies:   The following studies were reviewed today:  ECHO: 03/25/2019  1. The left ventricle has normal systolic function with an ejection fraction of 60-65%. The cavity size was normal. Left ventricular diastolic function could not be evaluated secondary to atrial fibrillation.  2. The right ventricle has normal systolic function. The cavity was normal. There is no increase in right ventricular wall thickness.  3. Left atrial size was severely dilated.  4. Right atrial size was mildly dilated.  5. No evidence of mitral valve stenosis.  6. No stenosis of the aortic valve.  7. The aorta is normal in size and structure.  8. The aortic root and ascending aorta are normal in size and structure.  9. Thickened.  FINDINGS  Left Ventricle: The left ventricle has normal systolic function, with an ejection fraction of 60-65%. The cavity size was normal. There is no increase in left ventricular wall thickness. Left ventricular diastolic function could not be evaluated  secondary to atrial fibrillation.  Right Ventricle: The right ventricle has normal systolic function. The cavity was normal. There is no increase in right ventricular wall thickness.  Left Atrium: Left atrial size was severely dilated.  Right Atrium: Right atrial size was mildly dilated. Right atrial pressure is estimated at 10 mmHg.  Interatrial Septum: Thickened.  Pericardium: There is no evidence of pericardial effusion.  Mitral Valve: The mitral valve is normal in structure. Mitral valve regurgitation is mild by color flow Doppler. No evidence of mitral valve stenosis.  Tricuspid Valve: The tricuspid valve is normal in structure. Tricuspid valve regurgitation is trivial by color flow Doppler.  Aortic Valve: The aortic valve is normal in structure. Aortic valve regurgitation was not visualized by color flow  Doppler. There is No stenosis of the aortic valve.  Pulmonic Valve: The pulmonic valve was grossly normal. Pulmonic valve regurgitation is not visualized by color flow Doppler.  Aorta: The aortic root and ascending aorta are normal in size and structure. The aorta is normal in size and structure.   Past Medical History:  Diagnosis Date  . Abnormality of gait 03/01/2015  . Anxiety   . Arthritis    "knees" (01/19/2016)  . Atrial fibrillation with RVR ([place]) 01/19/2016  . Back pain   . Chest pain   . Childhood asthma   . Chronic pain    "nerve pain from the MS"  . Constipation   . Depression   . DVT (deep venous thrombosis) ([place]) 1983   "RLE; may have just been phlebitis; it was before the age of dopplers"  . 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 GERD (gastroesophageal reflux disease)   . History of stress test 06/21/10   limited exam with some degree of breast attenuatin  of the apex however a component of apical ischemia is not excluded, EF 62%; cardiac cath   . HLD (hyperlipidemia)   . Hx of echocardiogram 04/22/10   EF 55-60%, no valve issues  . Hypertension   . Hypothyroidism   . Joint pain   . Migraine    "none since I stopped beta blocker in 1990s" (01/19/2016)  . Multiple sclerosis ([place])    has had this may 1989-[facility] reg doc  . OSA on CPAP   . Osteoarthritis   . PAF (paroxysmal atrial fibrillation) ([place])    on coumadin; documented on monitor 06/2010  . Pneumonia 11/2011  . Shortness of breath   . Thyroid disease    Past Surgical History:  Procedure Laterality Date  . ANTERIOR CERVICAL DECOMP/DISCECTOMY FUSION  01/2004  . BACK SURGERY    . CARDIAC CATHETERIZATION  06/22/2010   normal coronary arteries, PAF  . CATARACT EXTRACTION, BILATERAL Bilateral   . CHILD BIRTHS     X2  . DILATION AND CURETTAGE OF UTERUS    . INFUSION PUMP IMPLANTATION  ~ 2009   baclofen infusion in lower abd  . PAIN PUMP REVISION N/A  07/29/2014   Procedure: Baclofen pump replacement;  Surgeon: Erline Levine, MD;  Location: [facility];  Service: Neurosurgery;  Laterality: N/A;  Baclofen pump replacement  . TONSILLECTOMY AND ADENOIDECTOMY    . VARICOSE VEIN SURGERY  ~ 1968  . VENOUS ABLATION  12/16/2010   radiofreq ablation -Dr Elisabeth [facility]  . WISDOM TOOTH EXTRACTION       Current Meds  Medication Sig  . acetaminophen (TYLENOL) 500 MG tablet Take 500-1,000 mg by mouth every 6 (six) hours as needed for fever or headache (or pain).  Marland Kitchen ALPRAZolam (XANAX) 0.5 MG tablet TAKE 1 TABLET BY MOUTH AT BEDTIME AS NEEDED FOR ANXIETY  . baclofen (LIORESAL) 20 MG tablet Once to two tablets a day for breakthrough spasms (Patient taking differently: Take 20 mg by mouth 2 (two) times daily as needed for muscle spasms. )  . buPROPion (WELLBUTRIN XL) 300 MG 24 hr tablet Take 1 tablet (300 mg total) by mouth daily.  Marland Kitchen BYSTOLIC 2.5 MG tablet Take 2.5 mg by mouth daily.   Marland Kitchen CALCIUM CITRATE PO Take 2 tablets by mouth 2 (two) times daily.  . cetirizine (ZYRTEC) 10 MG tablet Take 10 mg by mouth daily as needed for allergies.   . Cholecalciferol (VITAMIN D) 2000 units tablet Take 1 tablet (2,000 Units total) by mouth 2 (two) times daily.  . corticotropin (ACTHAR) 80 UNIT/ML injectable gel INJECT 80 UNITS (1ML) DAILY FOR 10 DAYS. DUE TO MS EXACERBATION AND THEN CAN RESUME NEXT MONTH AT PREVIOUS DOSE.  Marland Kitchen Cranberry 1000 MG CAPS Take 2 capsules by mouth 2 (two) times daily.   Marland Kitchen diltiazem (CARDIZEM CD) 180 MG 24 hr capsule TAKE 1 CAPSULE BY MOUTH  DAILY (Patient taking differently: Take 180 mg by mouth daily. )  . docusate sodium (COLACE) 100 MG capsule Take 1 capsule (100 mg total) by mouth daily.  . famotidine (PEPCID) 20 MG tablet Take 20 mg by mouth at bedtime.   . fluticasone (FLONASE) 50 MCG/ACT nasal spray Place 2 sprays into both nostrils daily as needed for allergies.   . furosemide (LASIX) 20 MG tablet Take 10 mg by mouth 2 (two) times  daily.   Marland Kitchen gabapentin (NEURONTIN) 100 MG capsule Take 500 mg by mouth at bedtime.  . gabapentin (NEURONTIN) 300 MG capsule TAKE 1 CAPSULE BY  MOUTH UP  TO 4 TIMES DAILY (Patient taking differently: Take 300 mg by mouth 3 (three) times daily. )  . GENERLAC 10 GM/15ML SOLN TAKE 15 ML BY MOUTH TWICE DAILY AS NEEDED (Patient taking differently: Take 30 g by mouth daily as needed (constipation). )  . HYDROcodone-acetaminophen (NORCO/VICODIN) 5-325 MG tablet Take 1 tablet by mouth every 6 (six) hours as needed for moderate pain or severe pain.  Marland Kitchen levothyroxine (SYNTHROID, LEVOTHROID) 75 MCG tablet Take 75 mcg by mouth daily.  . Melatonin 3 MG TABS Take 1 tablet (3 mg total) by mouth at bedtime as needed (sleep).  . Multiple Vitamins-Minerals (CENTRUM SILVER PO) Take 1 tablet by mouth daily.  Marland Kitchen omeprazole (PRILOSEC) 20 MG capsule Take 20 mg by mouth daily.   . ondansetron (ZOFRAN) 4 MG tablet Take 4 mg by mouth every 8 (eight) hours as needed for nausea or vomiting.  . psyllium (METAMUCIL SMOOTH TEXTURE) 28 % packet Take 1 packet by mouth daily.   . simvastatin (ZOCOR) 10 MG tablet Take 10 mg by mouth every evening.   Marland Kitchen SYRINGE/NEEDLE, DISP, 1 ML (BD LUER-LOK SYRINGE) 25G X 5/8" 1 ML MISC Use as directed to inject  Acthar  . Tamsulosin HCl (FLOMAX) 0.4 MG CAPS Take 0.4 mg by mouth daily.  Marland Kitchen warfarin (COUMADIN) 5 MG tablet TAKE 1 TABLET BY MOUTH  DAILY AS DIRECTED BY  COUMADIN CLINIC (Patient taking differently: Take 2.5-5 mg by mouth as directed. Take 4.5mg  Friday, Saturday,sunday, Monday, Tuesday 4mg  Wednesday thursday)     Allergies:   Hydrocodone, Oxycodone, Chlorhexidine gluconate, and Zoloft [sertraline]   Social History   Tobacco Use  . Smoking status: Former Smoker    Packs/day: 0.50    Years: 7.00    Pack years: 3.50    Types: Cigarettes    Quit date: 08/21/1976    Years since quitting: 43.0  . Smokeless tobacco: Never Used  Substance Use Topics  . Alcohol use: Yes    Alcohol/week: 1.0  standard drinks    Types: 1 Glasses of wine per week  . Drug use: No     Family Hx: The patient's family history includes Alcoholism in her son; Anxiety disorder in her mother; Cancer in her paternal grandmother; Coronary artery disease in her father and maternal grandmother; Depression in her maternal grandmother and mother; Hyperlipidemia in her father; Sudden death in her mother; Thyroid disease in her father.  ROS:   Please see the history of present illness.    All other systems reviewed and are negative.   Labs/Other Tests and Data Reviewed:    Recent Labs: 03/19/2019: ALT 22; TSH 2.060 08/06/2019: BUN 22; Creatinine, Ser 0.56; Hemoglobin 13.5; Platelets 190; Potassium 3.9; Sodium 140   CBC    Component Value Date/Time   WBC 8.9 08/06/2019 0540   RBC 4.35 08/06/2019 0540   HGB 13.5 08/06/2019 0540   HGB 14.1 03/08/2017 1152   HCT 41.7 08/06/2019 0540   HCT 43.0 03/08/2017 1152   PLT 190 08/06/2019 0540   PLT 219 12/18/2014 1157   MCV 95.9 08/06/2019 0540   MCV 93 03/08/2017 1152   MCH 31.0 08/06/2019 0540   MCHC 32.4 08/06/2019 0540   RDW 12.9 08/06/2019 0540   RDW 13.3 03/08/2017 1152   LYMPHSABS 2.5 08/05/2019 0547   LYMPHSABS 2.2 03/08/2017 1152   MONOABS 1.4 (H) 08/05/2019 0547   EOSABS 0.2 08/05/2019 0547   EOSABS 0.1 03/08/2017 1152   BASOSABS 0.0 08/05/2019 0547   BASOSABS  0.0 03/08/2017 1152    CMP Latest Ref Rng & Units 08/06/2019 08/05/2019 08/04/2019  Glucose 70 - 99 mg/dL 93 79 120(H)  BUN 8 - 23 mg/dL 22 19 23   Creatinine 0.44 - 1.00 mg/dL 0.56 0.52 0.68  Sodium 135 - 145 mmol/L 140 140 140  Potassium 3.5 - 5.1 mmol/L 3.9 3.6 4.0  Chloride 98 - 111 mmol/L 100 101 100  CO2 22 - 32 mmol/L 32 30 31  Calcium 8.9 - 10.3 mg/dL 8.6(L) 8.3(L) 9.6  Total Protein 6.0 - 8.5 g/dL - - -  Total Bilirubin 0.0 - 1.2 mg/dL - - -  Alkaline Phos 39 - 117 IU/L - - -  AST 0 - 40 IU/L - - -  ALT 0 - 32 IU/L - - -     Recent Lipid Panel Lab Results   Component Value Date/Time   CHOL 111 03/19/2019 03:17 PM   TRIG 53 03/19/2019 03:17 PM   HDL 57 03/19/2019 03:17 PM   CHOLHDL 3.0 04/22/2010 05:36 AM   LDLCALC 43 03/19/2019 03:17 PM    Wt Readings from Last 3 Encounters:  08/21/19 180 lb (81.6 kg)  08/07/19 189 lb (85.7 kg)  06/25/19 186 lb (84.4 kg)     Objective:    Vital Signs:  BP 110/77   Pulse 94   Temp 99.2 F (37.3 C)   Resp 20   Wt 180 lb (81.6 kg)   BMI 31.89 kg/m    73 y.o. female in no acute distress over the phone.   ASSESSMENT & PLAN:    1.  Hypotension:  -Her systolic blood pressure was consistently in the 90s.  Orthostatics at that time were positive. -Her Cardizem, Bystolic, and Lasix were held for 4 days.  P.o. intake was encouraged and her blood pressure improved -Toni [name] wonders why she is now on Bystolic 2.5 mg daily instead of Bystolic 10 mg daily. - We discussed the effect that weight loss can have on blood pressure and that her Covid illness may be affecting her blood pressure as well. -I explained that it is most important to get the Cardizem on board for rate control of her atrial fibrillation, but the Bystolic helps as well. -However, her systolic blood pressure needs to be kept at approximately 100, or she will feel bad -Toni [name] is concerned that she will get worsening lower extremity edema if she does not take the Lasix -I explained that her weight is the lowest it has been in a long time, and she is not having any respiratory issues.  Therefore, I feel that her volume status is okay right now.  I agree that the Lasix helps with control of blood pressure and edema. -It is good to get the Lasix on board, but we can only do what her body will allow [name] to do.  She is on a reduced sodium diet at the facility and does not over drink.  2.  Permanent atrial fibrillation: -She needs the Cardizem for heart rate control if at all possible. -If her blood pressure does not tolerate both the Cardizem and  the Bystolic, DC the Bystolic. -If her blood pressure does not tolerate the Cardizem at 180 mg a day, will have to go to 120 mg daily -Hopefully, her blood pressure will stabilize and she will tolerate both Cardizem CD [id number] mg and Bystolic 2.5 mg a day -Goal heart rate is generally less than 100, okay to go up to approximately 120s when she  is exerting herself -Change the parameter for the Cardizem to hold it for a systolic blood pressure of less than 100.  Okay to keep the hold parameter at [id number] for the Bystolic and the Lasix.  3.  Hypertension: -She is on much less blood pressure medication than previously, most likely related to weight loss of over 100 pounds  4.  Chronic diastolic CHF: -Her weight is below previous weights -She denies increased dyspnea on exertion, orthopnea, or PND -Her Lasix dose is down to 10 mg a day, if her weight stays stable or decreases, that is likely enough -She is wearing compression socks consistently, that will help. -She is encouraged to elevate her legs several times a day for least 30 minutes  5.  Chronic venous insufficiency -I explained that the venous insufficiency makes it very easy for her to get lower extremity edema -She may have swelling in her legs, even when her volume status is good -Compression stockings will help this -Hopefully, she will be able to increase her activity which will help the swelling by using the muscles in her legs. -Elevate legs several times a day for 30 minutes or more to help the swelling  COVID-19 Education: The signs and symptoms of COVID-19 were discussed with the patient and how to seek care for testing (follow up with PCP or arrange E-visit).  The importance of social distancing was discussed today.  Patient Risk:   She has had a rapid test that was positive for Covid, a confirmatory test is pending at this time  Time:   Today, I have spent 25 minutes with the patient with telehealth technology discussing  cardiology and other health issues.     Medication Adjustments/Labs and Tests Ordered: Current medicines are reviewed at length with the patient today.  Concerns regarding medicines are outlined above.  Tests Ordered: No orders of the defined types were placed in this encounter.  Medication Changes: No orders of the defined types were placed in this encounter.   Disposition:  Follow up with Pixie Casino, MD   Signed, Rosaria Ferries, PA-C  08/21/2019 8:39 AM    [facility]

## 2019-08-21 NOTE — Patient Instructions (Signed)
Medication Instructions:  HOLD CARDIZEM FOR 0000000  HOLD BYSTOLIC AND LASIX 0000000  If you need a refill on your cardiac medications before your next appointment, please call your pharmacy.  Follow-Up: IN 3 months In Person K. Mali Hilty, MD.    At [facility], you and your health needs are our priority.  As part of our continuing mission to provide you with exceptional heart care, we have created designated Provider Care Teams.  These Care Teams include your primary Cardiologist (physician) and Advanced Practice Providers (APPs -  Physician Assistants and Nurse Practitioners) who all work together to provide you with the care you need, when you need it.  Thank you for choosing [facility] at [facility]!!

## 2019-08-22 ENCOUNTER — Other Ambulatory Visit: Payer: Self-pay

## 2019-08-22 ENCOUNTER — Inpatient Hospital Stay (HOSPITAL_COMMUNITY)
Admission: EM | Admit: 2019-08-22 | Discharge: 2019-08-29 | DRG: 178 | Disposition: A | Discharge status: Home / Self Care | Payer: Medicare Other | Attending: Internal Medicine | Admitting: Internal Medicine

## 2019-08-22 ENCOUNTER — Encounter (HOSPITAL_COMMUNITY): Payer: Self-pay | Admitting: Obstetrics and Gynecology

## 2019-08-22 ENCOUNTER — Emergency Department (HOSPITAL_COMMUNITY): Payer: Medicare Other

## 2019-08-22 DIAGNOSIS — E876 Hypokalemia: Secondary | ICD-10-CM | POA: Diagnosis not present

## 2019-08-22 DIAGNOSIS — M792 Neuralgia and neuritis, unspecified: Secondary | ICD-10-CM | POA: Diagnosis present

## 2019-08-22 DIAGNOSIS — I110 Hypertensive heart disease with heart failure: Secondary | ICD-10-CM | POA: Diagnosis present

## 2019-08-22 DIAGNOSIS — K5900 Constipation, unspecified: Secondary | ICD-10-CM | POA: Diagnosis present

## 2019-08-22 DIAGNOSIS — I509 Heart failure, unspecified: Secondary | ICD-10-CM | POA: Diagnosis present

## 2019-08-22 DIAGNOSIS — M1990 Unspecified osteoarthritis, unspecified site: Secondary | ICD-10-CM | POA: Diagnosis present

## 2019-08-22 DIAGNOSIS — E785 Hyperlipidemia, unspecified: Secondary | ICD-10-CM | POA: Diagnosis present

## 2019-08-22 DIAGNOSIS — Z7901 Long term (current) use of anticoagulants: Secondary | ICD-10-CM

## 2019-08-22 DIAGNOSIS — Z7409 Other reduced mobility: Secondary | ICD-10-CM | POA: Diagnosis not present

## 2019-08-22 DIAGNOSIS — M6281 Muscle weakness (generalized): Secondary | ICD-10-CM | POA: Diagnosis not present

## 2019-08-22 DIAGNOSIS — M25561 Pain in right knee: Secondary | ICD-10-CM | POA: Diagnosis present

## 2019-08-22 DIAGNOSIS — Z818 Family history of other mental and behavioral disorders: Secondary | ICD-10-CM | POA: Diagnosis not present

## 2019-08-22 DIAGNOSIS — R1312 Dysphagia, oropharyngeal phase: Secondary | ICD-10-CM | POA: Diagnosis not present

## 2019-08-22 DIAGNOSIS — I4821 Permanent atrial fibrillation: Secondary | ICD-10-CM | POA: Diagnosis present

## 2019-08-22 DIAGNOSIS — G35 Multiple sclerosis: Secondary | ICD-10-CM | POA: Diagnosis present

## 2019-08-22 DIAGNOSIS — I959 Hypotension, unspecified: Secondary | ICD-10-CM | POA: Diagnosis not present

## 2019-08-22 DIAGNOSIS — Z86718 Personal history of other venous thrombosis and embolism: Secondary | ICD-10-CM

## 2019-08-22 DIAGNOSIS — F29 Unspecified psychosis not due to a substance or known physiological condition: Secondary | ICD-10-CM | POA: Diagnosis not present

## 2019-08-22 DIAGNOSIS — S3210XS Unspecified fracture of sacrum, sequela: Secondary | ICD-10-CM | POA: Diagnosis not present

## 2019-08-22 DIAGNOSIS — R278 Other lack of coordination: Secondary | ICD-10-CM | POA: Diagnosis not present

## 2019-08-22 DIAGNOSIS — Z79899 Other long term (current) drug therapy: Secondary | ICD-10-CM

## 2019-08-22 DIAGNOSIS — I428 Other cardiomyopathies: Secondary | ICD-10-CM | POA: Diagnosis not present

## 2019-08-22 DIAGNOSIS — G4733 Obstructive sleep apnea (adult) (pediatric): Secondary | ICD-10-CM | POA: Diagnosis present

## 2019-08-22 DIAGNOSIS — E039 Hypothyroidism, unspecified: Secondary | ICD-10-CM | POA: Diagnosis present

## 2019-08-22 DIAGNOSIS — I951 Orthostatic hypotension: Secondary | ICD-10-CM

## 2019-08-22 DIAGNOSIS — I10 Essential (primary) hypertension: Secondary | ICD-10-CM | POA: Diagnosis not present

## 2019-08-22 DIAGNOSIS — G8929 Other chronic pain: Secondary | ICD-10-CM | POA: Diagnosis present

## 2019-08-22 DIAGNOSIS — R0602 Shortness of breath: Secondary | ICD-10-CM | POA: Diagnosis not present

## 2019-08-22 DIAGNOSIS — K219 Gastro-esophageal reflux disease without esophagitis: Secondary | ICD-10-CM | POA: Diagnosis present

## 2019-08-22 DIAGNOSIS — N390 Urinary tract infection, site not specified: Secondary | ICD-10-CM | POA: Diagnosis present

## 2019-08-22 DIAGNOSIS — F329 Major depressive disorder, single episode, unspecified: Secondary | ICD-10-CM | POA: Diagnosis present

## 2019-08-22 DIAGNOSIS — N3000 Acute cystitis without hematuria: Secondary | ICD-10-CM | POA: Diagnosis not present

## 2019-08-22 DIAGNOSIS — Z8349 Family history of other endocrine, nutritional and metabolic diseases: Secondary | ICD-10-CM | POA: Diagnosis not present

## 2019-08-22 DIAGNOSIS — Z7401 Bed confinement status: Secondary | ICD-10-CM | POA: Diagnosis not present

## 2019-08-22 DIAGNOSIS — E860 Dehydration: Secondary | ICD-10-CM | POA: Diagnosis not present

## 2019-08-22 DIAGNOSIS — M2550 Pain in unspecified joint: Secondary | ICD-10-CM | POA: Diagnosis not present

## 2019-08-22 DIAGNOSIS — R2689 Other abnormalities of gait and mobility: Secondary | ICD-10-CM | POA: Diagnosis not present

## 2019-08-22 DIAGNOSIS — Z809 Family history of malignant neoplasm, unspecified: Secondary | ICD-10-CM | POA: Diagnosis not present

## 2019-08-22 DIAGNOSIS — S3210XD Unspecified fracture of sacrum, subsequent encounter for fracture with routine healing: Secondary | ICD-10-CM | POA: Diagnosis not present

## 2019-08-22 DIAGNOSIS — F419 Anxiety disorder, unspecified: Secondary | ICD-10-CM | POA: Diagnosis present

## 2019-08-22 DIAGNOSIS — E119 Type 2 diabetes mellitus without complications: Secondary | ICD-10-CM | POA: Diagnosis not present

## 2019-08-22 DIAGNOSIS — Z79890 Hormone replacement therapy (postmenopausal): Secondary | ICD-10-CM

## 2019-08-22 DIAGNOSIS — R41841 Cognitive communication deficit: Secondary | ICD-10-CM | POA: Diagnosis not present

## 2019-08-22 DIAGNOSIS — R52 Pain, unspecified: Secondary | ICD-10-CM | POA: Diagnosis not present

## 2019-08-22 DIAGNOSIS — U071 COVID-19: Secondary | ICD-10-CM | POA: Diagnosis present

## 2019-08-22 DIAGNOSIS — Z8249 Family history of ischemic heart disease and other diseases of the circulatory system: Secondary | ICD-10-CM

## 2019-08-22 DIAGNOSIS — G4730 Sleep apnea, unspecified: Secondary | ICD-10-CM | POA: Diagnosis present

## 2019-08-22 LAB — URINALYSIS, ROUTINE W REFLEX MICROSCOPIC
Bilirubin Urine: NEGATIVE
Glucose, UA: NEGATIVE mg/dL
Ketones, ur: NEGATIVE mg/dL
Nitrite: POSITIVE — AB
Protein, ur: NEGATIVE mg/dL
Specific Gravity, Urine: 1.009 (ref 1.005–1.030)
WBC, UA: >50 WBC/hpf — ABNORMAL HIGH (ref 0–5)
pH: 7 (ref 5.0–8.0)

## 2019-08-22 LAB — COMPREHENSIVE METABOLIC PANEL
ALT: 36 U/L (ref 0–44)
AST: 32 U/L (ref 15–41)
Albumin: 3.1 g/dL — ABNORMAL LOW (ref 3.5–5.0)
Alkaline Phosphatase: 143 U/L — ABNORMAL HIGH (ref 38–126)
Anion gap: 10 (ref 5–15)
BUN: 22 mg/dL (ref 8–23)
CO2: 29 mmol/L (ref 22–32)
Calcium: 8.6 mg/dL — ABNORMAL LOW (ref 8.9–10.3)
Chloride: 98 mmol/L (ref 98–111)
Creatinine, Ser: 0.51 mg/dL (ref 0.44–1.00)
GFR calc Af Amer: >60 mL/min (ref >60)
GFR calc non Af Amer: >60 mL/min (ref >60)
Glucose, Bld: 81 mg/dL (ref 70–99)
Potassium: 3.7 mmol/L (ref 3.5–5.1)
Sodium: 137 mmol/L (ref 135–145)
Total Bilirubin: 0.4 mg/dL (ref 0.3–1.2)
Total Protein: 6 g/dL — ABNORMAL LOW (ref 6.5–8.1)

## 2019-08-22 LAB — CBC WITH DIFFERENTIAL/PLATELET
Abs Immature Granulocytes: 0.01 10*3/uL (ref 0.00–0.07)
Basophils Absolute: 0 10*3/uL (ref 0.0–0.1)
Basophils Relative: 0 %
Eosinophils Absolute: 0.1 10*3/uL (ref 0.0–0.5)
Eosinophils Relative: 3 %
HCT: 43 % (ref 36.0–46.0)
Hemoglobin: 14.2 g/dL (ref 12.0–15.0)
Immature Granulocytes: 0 %
Lymphocytes Relative: 35 %
Lymphs Abs: 1.9 10*3/uL (ref 0.7–4.0)
MCH: 30.9 pg (ref 26.0–34.0)
MCHC: 33 g/dL (ref 30.0–36.0)
MCV: 93.5 fL (ref 80.0–100.0)
Monocytes Absolute: 0.7 10*3/uL (ref 0.1–1.0)
Monocytes Relative: 13 %
Neutro Abs: 2.7 10*3/uL (ref 1.7–7.7)
Neutrophils Relative %: 49 %
Platelets: 203 10*3/uL (ref 150–400)
RBC: 4.6 MIL/uL (ref 3.87–5.11)
RDW: 12.5 % (ref 11.5–15.5)
WBC: 5.4 10*3/uL (ref 4.0–10.5)
nRBC: 0 % (ref 0.0–0.2)

## 2019-08-22 LAB — LACTIC ACID, PLASMA: Lactic Acid, Venous: 1.7 mmol/L (ref 0.5–1.9)

## 2019-08-22 LAB — TROPONIN I (HIGH SENSITIVITY): Troponin I (High Sensitivity): 5 ng/L (ref <18)

## 2019-08-22 LAB — PROTIME-INR
INR: 2.4 — ABNORMAL HIGH (ref 0.8–1.2)
Prothrombin Time: 26.3 seconds — ABNORMAL HIGH (ref 11.4–15.2)

## 2019-08-22 LAB — BRAIN NATRIURETIC PEPTIDE: B Natriuretic Peptide: 81.8 pg/mL (ref 0.0–100.0)

## 2019-08-22 LAB — POC SARS CORONAVIRUS 2 AG -  ED: SARS Coronavirus 2 Ag: POSITIVE — AB

## 2019-08-22 MED ORDER — SODIUM CHLORIDE 0.9 % IV SOLN: INTRAVENOUS | Status: DC

## 2019-08-22 MED ORDER — ONDANSETRON HCL 4 MG/2ML IJ SOLN
4.0000 mg | Freq: Four times a day (QID) | INTRAMUSCULAR | Status: DC | PRN
  Administered 2019-08-26 – 2019-08-29 (×6): 4 mg via INTRAVENOUS
  Filled 2019-08-22 (×6): qty 2

## 2019-08-22 MED ORDER — BUPROPION HCL ER (XL) 300 MG PO TB24
300.0000 mg | ORAL_TABLET | Freq: Every day | ORAL | Status: DC
  Administered 2019-08-23 – 2019-08-29 (×7): 300 mg via ORAL
  Filled 2019-08-22 (×7): qty 1

## 2019-08-22 MED ORDER — SODIUM CHLORIDE 0.9 % IV BOLUS
1000.0000 mL | Freq: Once | INTRAVENOUS | Status: AC
  Administered 2019-08-22: 18:00:00 1000 mL via INTRAVENOUS

## 2019-08-22 MED ORDER — TAMSULOSIN HCL 0.4 MG PO CAPS
0.4000 mg | ORAL_CAPSULE | Freq: Every day | ORAL | Status: DC
  Administered 2019-08-23 – 2019-08-28 (×3): 0.4 mg via ORAL
  Filled 2019-08-22 (×7): qty 1

## 2019-08-22 MED ORDER — GABAPENTIN 300 MG PO CAPS
300.0000 mg | ORAL_CAPSULE | Freq: Three times a day (TID) | ORAL | Status: DC
  Administered 2019-08-22 – 2019-08-29 (×21): 300 mg via ORAL
  Filled 2019-08-22 (×21): qty 1

## 2019-08-22 MED ORDER — SODIUM CHLORIDE 0.9 % IV BOLUS
1000.0000 mL | Freq: Once | INTRAVENOUS | Status: AC
  Administered 2019-08-22: 1000 mL via INTRAVENOUS

## 2019-08-22 MED ORDER — FAMOTIDINE 20 MG PO TABS
20.0000 mg | ORAL_TABLET | Freq: Every day | ORAL | Status: DC
  Administered 2019-08-22 – 2019-08-28 (×7): 20 mg via ORAL
  Filled 2019-08-22 (×7): qty 1

## 2019-08-22 MED ORDER — WARFARIN - PHARMACIST DOSING INPATIENT: Freq: Every day | Status: DC

## 2019-08-22 MED ORDER — WARFARIN SODIUM 2.5 MG PO TABS: 2.5000 mg | ORAL_TABLET | ORAL | Status: DC

## 2019-08-22 MED ORDER — ONDANSETRON HCL 4 MG PO TABS
4.0000 mg | ORAL_TABLET | Freq: Four times a day (QID) | ORAL | Status: DC | PRN
  Administered 2019-08-24 – 2019-08-28 (×4): 4 mg via ORAL
  Filled 2019-08-22 (×5): qty 1

## 2019-08-22 MED ORDER — WARFARIN SODIUM 4 MG PO TABS
4.5000 mg | ORAL_TABLET | ORAL | Status: AC
  Administered 2019-08-22: 4.5 mg via ORAL
  Filled 2019-08-22: qty 1

## 2019-08-22 MED ORDER — LEVOTHYROXINE SODIUM 75 MCG PO TABS: 75.0000 ug | ORAL_TABLET | Freq: Every day | ORAL | Status: DC

## 2019-08-22 MED ORDER — LEVOTHYROXINE SODIUM 50 MCG PO TABS
75.0000 ug | ORAL_TABLET | Freq: Every day | ORAL | Status: DC
  Administered 2019-08-23 – 2019-08-29 (×7): 75 ug via ORAL
  Filled 2019-08-22 (×7): qty 1

## 2019-08-22 MED ORDER — SODIUM CHLORIDE 0.9 % IV SOLN
1.0000 g | Freq: Once | INTRAVENOUS | Status: AC
  Administered 2019-08-22: 1 g via INTRAVENOUS
  Filled 2019-08-22: qty 10

## 2019-08-22 MED ORDER — SODIUM CHLORIDE 0.9 % IV BOLUS
500.0000 mL | Freq: Once | INTRAVENOUS | Status: AC
  Administered 2019-08-22: 17:00:00 500 mL via INTRAVENOUS

## 2019-08-22 MED ORDER — WARFARIN SODIUM 4 MG PO TABS: 4.5000 mg | ORAL_TABLET | ORAL | Status: DC

## 2019-08-22 MED ORDER — SODIUM CHLORIDE 0.9 % IV BOLUS
500.0000 mL | Freq: Once | INTRAVENOUS | Status: AC
  Administered 2019-08-22: 500 mL via INTRAVENOUS

## 2019-08-22 MED ORDER — MELATONIN 3 MG PO TABS
1.0000 | ORAL_TABLET | Freq: Every evening | ORAL | Status: DC | PRN
  Administered 2019-08-22 – 2019-08-28 (×7): 3 mg via ORAL
  Filled 2019-08-22 (×7): qty 1

## 2019-08-22 NOTE — Discharge Instructions (Addendum)
Please hold your blood pressure medicines (bystolic, cardizem) if your blood pressure is less than 110. Hold lasix for now   Please contact cardiology in several days for further recommendations   Stay hydrated   See your doctor   Return to ER if your BP is less than 90, vomiting, fever, trouble breathing

## 2019-08-22 NOTE — ED Notes (Signed)
RN spoke to [place] at [facility] and [place] and was told there was no available nurse for report

## 2019-08-22 NOTE — Progress Notes (Signed)
BIPAP QHS is contraindicated due to positive COVID test.

## 2019-08-22 NOTE — Progress Notes (Signed)
ANTICOAGULATION CONSULT NOTE - Initial Consult  Pharmacy Consult for warfarin Indication: atrial fibrillation  Allergies  Allergen Reactions  . Hydrocodone Nausea And Vomiting    Hydrocodone causes vomiting  . Oxycodone Nausea And Vomiting    Oral oxycodone causes vomiting  . Chlorhexidine Gluconate Rash    Sores at site  . Zoloft [Sertraline] Hives, Swelling and Rash    Patient Measurements:   Heparin Dosing Weight:   Vital Signs: Temp: 98.6 F (37 C) (01/01 1310) Temp Source: Oral (01/01 1310) BP: 100/56 (01/01 2200) Pulse Rate: 75 (01/01 2200)  Labs: Recent Labs    08/22/19 1404  HGB 14.2  HCT 43.0  PLT 203  LABPROT 26.3*  INR 2.4*  CREATININE 0.51  TROPONINIHS 5    Estimated Creatinine Clearance: 64.3 mL/min (by C-G formula based on SCr of 0.51 mg/dL).   Medical History: Past Medical History:  Diagnosis Date  . Abnormality of gait 03/01/2015  . Anxiety   . Arthritis    "knees" (01/19/2016)  . Atrial fibrillation with RVR ([place]) 01/19/2016  . Back pain   . Chest pain   . Childhood asthma   . Chronic pain    "nerve pain from the MS"  . Constipation   . Depression   . DVT (deep venous thrombosis) ([place]) 1983   "RLE; may have just been phlebitis; it was before the age of dopplers"  . 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 GERD (gastroesophageal reflux disease)   . History of stress test 06/21/10   limited exam with some degree of breast attenuatin of the apex however a component of apical ischemia is not excluded, EF 62%; cardiac cath   . HLD (hyperlipidemia)   . Hx of echocardiogram 04/22/10   EF 55-60%, no valve issues  . Hypertension   . Hypothyroidism   . Joint pain   . Migraine    "none since I stopped beta blocker in 1990s" (01/19/2016)  . Multiple sclerosis ([place])    has had this may 1989-[facility] reg doc  . OSA on CPAP   . Osteoarthritis   . PAF (paroxysmal atrial fibrillation) ([place])    on  coumadin; documented on monitor 06/2010  . Pneumonia 11/2011  . Shortness of breath   . Thyroid disease     Medications:  (Not in a hospital admission)  Scheduled:  . [START ON 08/23/2019] buPROPion  300 mg Oral Daily  . famotidine  20 mg Oral QHS  . gabapentin  300 mg Oral TID  . [START ON 08/23/2019] levothyroxine  75 mcg Oral Daily  . [START ON 08/23/2019] tamsulosin  0.4 mg Oral Daily  . warfarin  4.5 mg Oral NOW  . [START ON 08/23/2019] Warfarin - Pharmacist Dosing Inpatient   Does not apply q1800    Assessment: Patient on warfarin for hx of Afib.  Last dose of warfarin noted 08/21/19.  INR on admit 2.4  Goal of Therapy:  INR 2-3    Plan:  Daily INR Warfarin 4.5mg  po x1 now  Nani Skillern Crowford 08/22/2019,11:06 PM

## 2019-08-22 NOTE — ED Triage Notes (Signed)
Per EMS: Pt is coming from [facility] and rehab. Patient reportedly has COVID since Tuesday.  Pt denies complaints at this time.

## 2019-08-22 NOTE — ED Notes (Signed)
Attending at bedside.

## 2019-08-22 NOTE — ED Provider Notes (Addendum)
[facility] DEPT Provider Note   CSN: [id number] Arrival date & time: 08/22/19  1254     History Chief Complaint  Patient presents with  . COVID+    Toni [name] is a 74 y.o. female history of A. fib on Coumadin, CHF, here presenting with hypotension Patient was recently admitted to the hospital after a fall and has been weak.  She also had multiple sclerosis and had recent MRI that did not show any flareup of her MS. Patient is at [facility] for rehab.  Patient states that her blood pressure has been low recently .  She saw cardiology yesterday virtually and her Bystolic was decreased to 2.5 from 10.  Her Lasix and Cardizem were held but she was given a dose of Cardizem this morning since her heart rate went up to 130 .  She states that this morning the physical therapist came to talk to her and her blood pressure dropped to the 80s when she stood by the side of the bed.  She went to be evaluated for orthostatic hypotension .  Patient denies any fevers or chills or vomiting.  Finally, patient was tested positive for Covid 3 days ago and has been anxious about her diagnosis.   The history is provided by the patient.       Past Medical History:  Diagnosis Date  . Abnormality of gait 03/01/2015  . Anxiety   . Arthritis    "knees" (01/19/2016)  . Atrial fibrillation with RVR ([place]) 01/19/2016  . Back pain   . Chest pain   . Childhood asthma   . Chronic pain    "nerve pain from the MS"  . Constipation   . Depression   . DVT (deep venous thrombosis) ([place]) 1983   "RLE; may have just been phlebitis; it was before the age of dopplers"  . 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 GERD (gastroesophageal reflux disease)   . History of stress test 06/21/10   limited exam with some degree of breast attenuatin of the apex however a component of apical ischemia is not excluded, EF 62%; cardiac cath   . HLD  (hyperlipidemia)   . Hx of echocardiogram 04/22/10   EF 55-60%, no valve issues  . Hypertension   . Hypothyroidism   . Joint pain   . Migraine    "none since I stopped beta blocker in 1990s" (01/19/2016)  . Multiple sclerosis ([place])    has had this may 1989-[facility] reg doc  . OSA on CPAP   . Osteoarthritis   . PAF (paroxysmal atrial fibrillation) ([place])    on coumadin; documented on monitor 06/2010  . Pneumonia 11/2011  . Shortness of breath   . Thyroid disease     Patient Active Problem List   Diagnosis Date Noted  . Constipation   . Bilateral leg weakness 08/04/2019  . Acute lower UTI 08/04/2019  . Weakness 08/04/2019  . Unilateral primary osteoarthritis, left knee 01/09/2019  . Type 2 diabetes mellitus without complication, without long-term current use of insulin ([place]) 08/27/2018  . Obstructive sleep apnea treated with bilevel positive airway pressure (BiPAP) 08/15/2018  . Sleep apnea treated with nocturnal BiPAP 04/23/2018  . 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 ([place]) 11/21/2017  . Permanent atrial fibrillation ([place]) 11/21/2017  . Tightness of leg fascia 11/21/2017  . Muscle spasm of both lower legs 11/21/2017  .  Carpal tunnel syndrome, left upper limb 09/13/2017  . Carpal tunnel syndrome, right upper limb 09/13/2017  . Other insomnia 06/11/2017  . Depression 06/11/2017  . Abdominal swelling 03/29/2017  . Central apnea 03/26/2017  . Class 3 obesity with serious comorbidity and body mass index (BMI) of 40.0 to 44.9 in adult 03/22/2017  . Vitamin D deficiency 03/22/2017  . Prediabetes 03/22/2017  . Venous insufficiency 02/14/2017  . Right hand weakness 08/06/2016  . Nonischemic cardiomyopathy ([place]) 05/09/2016  . Localized edema 05/09/2016  . Long term current use of anticoagulant therapy 02/03/2016  . 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 Atrial fibrillation with RVR ([place]) 01/19/2016  . 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 Obesity hypoventilation syndrome ([place]) 03/30/2015  . Abnormality of gait 03/01/2015  . Chronic constipation with overflow incontinence 01/25/2015  . 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) ([name]) 01/25/2015  . 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 Closed right ankle fracture 10/30/2013  . Leucocytosis 10/30/2013  . Varicose veins of both lower extremities with complications 0000000  . Muscle spasms of lower extremity 02/24/2013  . Long term (current) use of anticoagulants 11/05/2012  . 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 Aspiration pneumonia vs Failed outpatient Rx of CAP 11/26/2011  . Essential hypertension   . Thyroid disease   . HLD (hyperlipidemia)     Past Surgical History:  Procedure Laterality Date  . ANTERIOR CERVICAL DECOMP/DISCECTOMY FUSION  01/2004  . BACK SURGERY    . CARDIAC CATHETERIZATION  06/22/2010   normal coronary arteries, PAF  . CATARACT EXTRACTION, BILATERAL Bilateral   . CHILD BIRTHS     X2  . DILATION AND CURETTAGE OF UTERUS    . INFUSION PUMP IMPLANTATION  ~ 2009   baclofen infusion in lower abd  . PAIN PUMP REVISION N/A 07/29/2014   Procedure: Baclofen pump replacement;  Surgeon: Erline Levine, MD;  Location: [facility];  Service: Neurosurgery;  Laterality: N/A;  Baclofen pump replacement  . TONSILLECTOMY AND ADENOIDECTOMY    . VARICOSE VEIN SURGERY  ~ 1968  . VENOUS ABLATION  12/16/2010   radiofreq ablation -Dr Elisabeth Cara and [facility]  . WISDOM TOOTH EXTRACTION       OB History    Gravida  2   Para      Term      Preterm      AB      Living        SAB      TAB      Ectopic      Multiple      Live Births              Family History  Problem Relation Age of Onset  . Coronary artery disease Father        at age 61  . Hyperlipidemia Father     . Thyroid disease Father   . Coronary artery disease Maternal Grandmother   . Depression Maternal Grandmother   . Cancer Paternal Grandmother   . Depression Mother   . Sudden death Mother   . Anxiety disorder Mother   . Alcoholism Son     Social History   Tobacco Use  . Smoking status: Former Smoker    Packs/day: 0.50    Years: 7.00    Pack years: 3.50    Types: Cigarettes  Quit date: 08/21/1976    Years since quitting: 43.0  . Smokeless tobacco: Never Used  Substance Use Topics  . Alcohol use: Yes    Alcohol/week: 1.0 standard drinks    Types: 1 Glasses of wine per week  . Drug use: No    Home Medications Prior to Admission medications   Medication Sig Start Date End Date Taking? Authorizing Provider  acetaminophen (TYLENOL) 500 MG tablet Take 500-1,000 mg by mouth every 6 (six) hours as needed for fever or headache (or pain).   Yes [provider]  ALPRAZolam (XANAX) 0.5 MG tablet TAKE 1 TABLET BY MOUTH AT BEDTIME AS NEEDED FOR ANXIETY Patient taking differently: Take 0.5 mg by mouth at bedtime as needed for anxiety.  08/08/19  Yes Donne Hazel, MD  Ascorbic Acid (VITAMIN C) 500 MG CAPS Take 500 mg by mouth daily.   Yes [provider]  baclofen (LIORESAL) 20 MG tablet Once to two tablets a day for breakthrough spasms Patient taking differently: Take 20 mg by mouth 2 (two) times daily as needed for muscle spasms.  08/27/18  Yes Dohmeier, Asencion Partridge, MD  buPROPion (WELLBUTRIN XL) 300 MG 24 hr tablet Take 1 tablet (300 mg total) by mouth daily. 08/23/17  Yes Beasley, Caren D, MD  BYSTOLIC 2.5 MG tablet Take 1 tablet (2.5 mg total) by mouth daily. Hold for SBP<110 08/21/19  Yes Barrett, Evelene Croon, PA-C  Calcium Carbonate-Vit D-Min (CALTRATE 600+D PLUS MINERALS) 600-800 MG-UNIT TABS Take 2 tablets by mouth 2 (two) times daily.   Yes [provider]  cetirizine (ZYRTEC) 10 MG tablet Take 10 mg by mouth daily as needed for allergies.    Yes [provider]  Cranberry 1000 MG CAPS Take 2 capsules by mouth 2 (two) times daily.    Yes [provider]  diltiazem (CARDIZEM CD) 180 MG 24 hr capsule Hold for SBP<100 Patient taking differently: Take 180 mg by mouth daily. Hold for SBP<100 08/21/19  Yes Barrett, Evelene Croon, PA-C  docusate sodium (COLACE) 100 MG capsule Take 1 capsule (100 mg total) by mouth daily. 08/12/16  Yes Rai, Ripudeep K, MD  famotidine (PEPCID) 20 MG tablet Take 20 mg by mouth at bedtime.  06/11/18  Yes [provider]  fluticasone (FLONASE) 50 MCG/ACT nasal spray Place 2 sprays into both nostrils daily as needed for allergies.  03/23/15  Yes [provider]  furosemide (LASIX) 20 MG tablet Take 0.5 tablets (10 mg total) by mouth 2 (two) times daily. Hold for SBP<110 Patient taking differently: Take 10 mg by mouth daily. Hold for SBP<110 08/21/19  Yes Barrett, Evelene Croon, PA-C  gabapentin (NEURONTIN) 100 MG capsule Take 200 mg by mouth at bedtime. Given with gabapentin 300mg  qhs for a total dose of 500 mg at bedtime   Yes [provider]  gabapentin (NEURONTIN) 300 MG capsule TAKE 1 CAPSULE BY MOUTH UP  TO 4 TIMES DAILY Patient taking differently: Take 300 mg by mouth 3 (three) times daily.  12/31/18  Yes Dohmeier, Asencion Partridge, MD  GENERLAC 10 GM/15ML SOLN TAKE 15 ML BY MOUTH TWICE DAILY AS NEEDED Patient taking differently: Take 30 g by mouth daily as needed (constipation).  06/25/19  Yes Dohmeier, Asencion Partridge, MD  HYDROcodone-acetaminophen (NORCO/VICODIN) 5-325 MG tablet Take 1 tablet by mouth every 6 (six) hours as needed for moderate pain or severe pain. 08/08/19  Yes Donne Hazel, MD  levothyroxine (SYNTHROID, LEVOTHROID) 75 MCG tablet Take 75 mcg by mouth daily.  Yes [provider]  Melatonin 3 MG TABS Take 1 tablet (3 mg total) by mouth at bedtime as needed (sleep). 08/08/19  Yes Donne Hazel, MD  omeprazole (PRILOSEC) 20 MG capsule Take 20 mg by mouth daily.    Yes [provider]  ondansetron (ZOFRAN) 4 MG tablet Take 4 mg by mouth every 8 (eight) hours as needed for nausea or vomiting.   Yes [provider]  psyllium (METAMUCIL SMOOTH TEXTURE) 28 % packet Take 1 packet by mouth daily.    Yes [provider]  simvastatin (ZOCOR) 10 MG tablet Take 10 mg by mouth every evening.  03/01/18  Yes [provider]  Tamsulosin HCl (FLOMAX) 0.4 MG CAPS Take 0.4 mg by mouth daily.   Yes [provider]  warfarin (COUMADIN) 5 MG tablet TAKE 1 TABLET BY MOUTH  DAILY AS DIRECTED BY  COUMADIN CLINIC Patient taking differently: Take 2.5-5 mg by mouth as directed. Take 4.5mg  Friday, Saturday,sunday, Monday, Tuesday 4mg  Wednesday thursday 10/30/18  Yes Hilty, Nadean Corwin, MD  zinc gluconate 50 MG tablet Take 50 mg by mouth daily.   Yes [provider]  Cholecalciferol (VITAMIN D) 2000 units tablet Take 1 tablet (2,000 Units total) by mouth 2 (two) times daily. Patient not taking: Reported on 08/22/2019 03/22/17   Dennard Nip D, MD  corticotropin (ACTHAR) 80 UNIT/ML injectable gel INJECT 80 UNITS (1ML) DAILY FOR 10 DAYS. DUE TO MS EXACERBATION AND THEN CAN RESUME NEXT MONTH AT PREVIOUS DOSE. Patient not taking: Reported on 08/22/2019 06/18/19   Dohmeier, Asencion Partridge, MD  SYRINGE/NEEDLE, Saxon, 1 ML (BD LUER-LOK SYRINGE) 25G X 5/8" 1 ML MISC Use as directed to inject  Acthar 06/15/15   Dohmeier, Asencion Partridge, MD    Allergies    Hydrocodone, Oxycodone, Chlorhexidine gluconate, and Zoloft [sertraline]  Review of Systems   Review of Systems  Respiratory: Positive for shortness of breath.   Neurological: Positive for weakness.  All other systems reviewed and are negative.   Physical Exam Updated Vital Signs BP (!) 106/56 (BP Location: Right Arm)   Pulse 94   Temp 98.6 F (37 C) (Oral)   Resp 16   SpO2 98%   Physical Exam Vitals and nursing note reviewed.  Constitutional:      Comments: Chronically ill, anxious   HENT:     Head:  Normocephalic.     Nose: Nose normal.     Mouth/Throat:     Mouth: Mucous membranes are moist.  Eyes:     Extraocular Movements: Extraocular movements intact.     Pupils: Pupils are equal, round, and reactive to light.  Cardiovascular:     Rate and Rhythm: Normal rate and regular rhythm.     Pulses: Normal pulses.     Heart sounds: Normal heart sounds.  Pulmonary:     Comments: Diminished bilaterally, no wheezing, no distress  Abdominal:     General: Abdomen is flat.     Palpations: Abdomen is soft.  Musculoskeletal:        General: Normal range of motion.     Cervical back: Normal range of motion.  Skin:    General: Skin is warm.     Capillary Refill: Capillary refill takes less than 2 seconds.  Neurological:     General: No focal deficit present.     Mental Status: She is oriented to person, place, and time.  Psychiatric:        Mood and Affect: Mood normal.  Behavior: Behavior normal.     ED Results / Procedures / Treatments   Labs (all labs ordered are listed, but only abnormal results are displayed) Labs Reviewed  COMPREHENSIVE METABOLIC PANEL - Abnormal; Notable for the following components:      Result Value   Calcium 8.6 (*)    Total Protein 6.0 (*)    Albumin 3.1 (*)    Alkaline Phosphatase 143 (*)    All other components within normal limits  PROTIME-INR - Abnormal; Notable for the following components:   Prothrombin Time 26.3 (*)    INR 2.4 (*)    All other components within normal limits  POC SARS CORONAVIRUS 2 AG -  ED - Abnormal; Notable for the following components:   SARS Coronavirus 2 Ag POSITIVE (*)    All other components within normal limits  CBC WITH DIFFERENTIAL/PLATELET  BRAIN NATRIURETIC PEPTIDE  TROPONIN I (HIGH SENSITIVITY)  TROPONIN I (HIGH SENSITIVITY)    EKG EKG Interpretation  Date/Time:  Friday August 22 2019 13:08:47 EST Ventricular Rate:  99 PR Interval:    QRS Duration: 94 QT Interval:  364 QTC Calculation: 463 R  Axis:   40 Text Interpretation: Atrial fibrillation Ventricular premature complex Borderline T abnormalities, anterior leads No significant change since last tracing Confirmed by Wandra Arthurs [phone]) on 08/22/2019 2:15:56 PM   Radiology DG Chest Port 1 View  Result Date: 08/22/2019 CLINICAL DATA:  Shortness of breath EXAM: PORTABLE CHEST 1 VIEW COMPARISON:  07/08/2017 FINDINGS: Stable cardiomegaly. Pulmonary vasculature is within normal limits. No focal airspace consolidation, pleural effusion, or pneumothorax. IMPRESSION: Stable cardiomegaly without edema or focal airspace disease. Electronically Signed   By: Davina Poke D.O.   On: 08/22/2019 14:09    Procedures Procedures (including critical care time)  Medications Ordered in ED Medications  sodium chloride 0.9 % bolus 500 mL (has no administration in time range)    ED Course  I have reviewed the triage vital signs and the nursing notes.  Pertinent labs & imaging results that were available during my care of the patient were reviewed by me and considered in my medical decision making (see chart for details).    MDM Rules/Calculators/A&P                      Toni [name] is a 74 y.o. female who presented with hypotension.  Patient is on multiple BP meds and on Lasix that could cause her hypotension .  Patient also recently diagnosed with Covid.  Patient is not hypoxic and appears chronically ill but not acutely ill.  Will get lab work and chest x-ray and BNP and urinalysis to rule out emergent conditions.  5:07 PM Patient is not hypotensive in the ED. BP remained low 100s. INR 2.4. CXR clear. Labs unremarkable. Not hypoxic. Doesn't meet criteria for admission for COVID. She sat up and BP remained in low 100s. Given 500 cc bolus. She remained hemodynamically stable. Family wanted to bring her to another rehab facility but since she is COVID positive, she will need to remain at [facility] place for the time being. Will recommend  holding BP meds for now.   6 pm Upon discharge, patient dropped her BP again to the 90s. Received 1 L NS bolus initially. Will give another bolus. She recently finished doxycycline for UTI. Will get UA and get lactate, cultures.   8:30 PM Patient finished 2 L bolus. Her BP is still 97/78. UA + UTI. Previous cultures  showed Staph aureus sensitive to cephalosporins. Will give rocephin.  Hypotension is likely multifactorial.  She is on multiple BP meds as well as she has Covid and she has a UTI .  At this point, since she is still hypotensive, will admit for observation.   Final Clinical Impression(s) / ED Diagnoses Final diagnoses:  None    Rx / DC Orders ED Discharge Orders    None       Drenda Freeze, MD 08/22/19 1709    Drenda Freeze, MD 08/22/19 2040

## 2019-08-22 NOTE — ED Notes (Signed)
[facility] reports that the patient has been having decreased BP. Patient has been seen by tele-cardiology for same. Patient was tearful with this RN and NT in the room. Patient crying stating "I would rather be here then at [facility]. I don't want to go back there." Patient anxious about potential of going back.  RN made MD aware of patient concerns

## 2019-08-23 DIAGNOSIS — U071 COVID-19: Secondary | ICD-10-CM

## 2019-08-23 DIAGNOSIS — I959 Hypotension, unspecified: Secondary | ICD-10-CM

## 2019-08-23 HISTORY — DX: COVID-19: U07.1

## 2019-08-23 LAB — CBC
HCT: 42.5 % (ref 36.0–46.0)
Hemoglobin: 13.7 g/dL (ref 12.0–15.0)
MCH: 31.1 pg (ref 26.0–34.0)
MCHC: 32.2 g/dL (ref 30.0–36.0)
MCV: 96.6 fL (ref 80.0–100.0)
Platelets: 165 10*3/uL (ref 150–400)
RBC: 4.4 MIL/uL (ref 3.87–5.11)
RDW: 12.5 % (ref 11.5–15.5)
WBC: 5.4 10*3/uL (ref 4.0–10.5)
nRBC: 0 % (ref 0.0–0.2)

## 2019-08-23 LAB — COMPREHENSIVE METABOLIC PANEL
ALT: 36 U/L (ref 0–44)
AST: 34 U/L (ref 15–41)
Albumin: 2.8 g/dL — ABNORMAL LOW (ref 3.5–5.0)
Alkaline Phosphatase: 125 U/L (ref 38–126)
Anion gap: 8 (ref 5–15)
BUN: 20 mg/dL (ref 8–23)
CO2: 27 mmol/L (ref 22–32)
Calcium: 8.1 mg/dL — ABNORMAL LOW (ref 8.9–10.3)
Chloride: 105 mmol/L (ref 98–111)
Creatinine, Ser: 0.48 mg/dL (ref 0.44–1.00)
GFR calc Af Amer: >60 mL/min (ref >60)
GFR calc non Af Amer: >60 mL/min (ref >60)
Glucose, Bld: 136 mg/dL — ABNORMAL HIGH (ref 70–99)
Potassium: 3.1 mmol/L — ABNORMAL LOW (ref 3.5–5.1)
Sodium: 140 mmol/L (ref 135–145)
Total Bilirubin: 0.3 mg/dL (ref 0.3–1.2)
Total Protein: 5.1 g/dL — ABNORMAL LOW (ref 6.5–8.1)

## 2019-08-23 LAB — C-REACTIVE PROTEIN: CRP: 0.6 mg/dL (ref <1.0)

## 2019-08-23 LAB — FERRITIN: Ferritin: 133 ng/mL (ref 11–307)

## 2019-08-23 LAB — PROTIME-INR
INR: 2.7 — ABNORMAL HIGH (ref 0.8–1.2)
Prothrombin Time: 28.8 seconds — ABNORMAL HIGH (ref 11.4–15.2)

## 2019-08-23 MED ORDER — WARFARIN SODIUM 2.5 MG PO TABS
2.5000 mg | ORAL_TABLET | Freq: Once | ORAL | Status: AC
  Administered 2019-08-23: 18:00:00 2.5 mg via ORAL
  Filled 2019-08-23: qty 1

## 2019-08-23 MED ORDER — POTASSIUM CHLORIDE CRYS ER 20 MEQ PO TBCR
40.0000 meq | EXTENDED_RELEASE_TABLET | ORAL | Status: AC
  Administered 2019-08-23 (×2): 40 meq via ORAL
  Filled 2019-08-23 (×2): qty 2

## 2019-08-23 MED ORDER — DOCUSATE SODIUM 100 MG PO CAPS
100.0000 mg | ORAL_CAPSULE | Freq: Every day | ORAL | Status: DC | PRN
  Administered 2019-08-23 – 2019-08-27 (×3): 100 mg via ORAL
  Filled 2019-08-23 (×3): qty 1

## 2019-08-23 MED ORDER — LACTULOSE ENCEPHALOPATHY 10 GM/15ML PO SOLN
30.0000 g | Freq: Every day | ORAL | Status: DC | PRN
  Administered 2019-08-23 – 2019-08-26 (×2): 30 g via ORAL
  Filled 2019-08-23 (×3): qty 45

## 2019-08-23 MED ORDER — METOPROLOL TARTRATE 25 MG PO TABS
12.5000 mg | ORAL_TABLET | Freq: Two times a day (BID) | ORAL | Status: DC
  Administered 2019-08-23 – 2019-08-29 (×12): 12.5 mg via ORAL
  Filled 2019-08-23 (×13): qty 1

## 2019-08-23 MED ORDER — SODIUM CHLORIDE 0.9 % IV SOLN
1.0000 g | INTRAVENOUS | Status: DC
  Administered 2019-08-23 – 2019-08-24 (×2): 1 g via INTRAVENOUS
  Filled 2019-08-23 (×2): qty 1
  Filled 2019-08-23: qty 10

## 2019-08-23 MED ORDER — LORATADINE 10 MG PO TABS
10.0000 mg | ORAL_TABLET | Freq: Every day | ORAL | Status: DC
  Administered 2019-08-23 – 2019-08-29 (×7): 10 mg via ORAL
  Filled 2019-08-23 (×7): qty 1

## 2019-08-23 NOTE — Progress Notes (Signed)
PROGRESS NOTE    Toni [name]  [id number] DOB: 20-Jan-1946 DOA: 08/22/2019 PCP: Prince Solian, MD    Brief Narrative:   Toni [name] is a 74 year old Caucasian female with past medical history remarkable for essential hypertension, anxiety, hypothyroidism, atrial fibrillation on Coumadin, multiple sclerosis, who presented from [facility] for concerns of hypotension. Patient states that she was recently admitted and treated in the hospital status post fall and severe weakness.  Patient was sent to rehab after discharge.  Patient states that she recently started having low blood pressure and yesterday she was evaluated by cardiologist who decreased her Bystolic from 10 mg to 2.5 mg and also held her Lasix and Cardizem but this morning her heart rate went up to 130 so she took 1 dose of Cardizem for her atrial fibrillation with increased heart rate.  Patient further mentioned that today during the physical therapy her blood pressure dropped to 80s.  Patient admits of having some difficulty with urination and also reported that she was tested positive for COVID-19 3 days ago.  Patient otherwise denies fever, chills, headache, sore throat, loss of taste and smell sensation, chest pain, nausea, vomiting, abdominal pain and diarrhea.  ED Course: In ED on arrival patient temperature of 98.6, blood pressure 101/50 that dropped to 98/60, heart rate 68 and oxygen saturation 95% on room air.  Urine was positive for UTI.  In ED patient was managed with 3 L of normal saline and also given 1 g of ceftriaxone.   Assessment & Plan:   Principal Problem:   Acute hypotension Active Problems:   Sleep apnea treated with nocturnal BiPAP   Acute lower UTI   COVID-19 virus infection   Acute Covid-19 viral infection during the ongoing 2020 Covid 19 Pandemic - POA Patient presenting from [facility], was Covid-19 - on 08/06/2019.  Now positive Covid-19 on 08/22/2019.  Currently oxygenating well  on room air.  Chest x-ray with stable cardiomegaly without edema or focal airspace disease. --Check CRP and ferritin --No indication for remdesivir, steroids or Actemra at this point --Continue to monitor respiratory status closely --Continue to trend CRP, ferritin daily --Continue airborne/contact isolation precautions  Hypotension Patient presenting from SNF with hypotensive episode.  92/44 at time of presentation.  Recently seen by cardiology with a decrease in her Bystolic from 10mg  to 2.5 mg daily.  Also concerned this may be related to recent poor oral intake versus UTI.  Patient received 3 L NS bolus in the ED. --BP 108/54 this morning --Continue NS at 100 mL's per hour --Hold home Bystolic, Cardizem, furosemide --Consider DC tamsulosin --Start low-dose metoprolol tartrate 12.5 mg p.o. twice daily as should have less effect on BP; hold for SBP <100 or HR <60 --Continue close monitoring of blood pressure --Continue to monitor on telemetry  Permanent atrial fibrillation on anticoagulation with Coumadin Patient on Bystolic 2.5mg  po daily , Cardizem 180 mg p.o. daily, and furosemide 10mg  BID. --Holding home Bystolic, Cardizem and furosemide secondary to hypotension --Start metoprolol tartrate 12.5 mg p.o. twice daily for rate control --INR 2.7, at goal (2-3); pharmacy consulted for assistance with dosing/monitoring --Continue to monitor on telemetry  Urinary tract infection Urinalysis with large leukoesterase, positive nitrite, rare bacteria and greater than 50 WBCs. --Continue ceftriaxone 1 g IV every 24 hours --Blood cultures x2: Pending --Await further identification and susceptibilities from a culture  Hypokalemia Potassium 3.1 this morning, likely secondary to aggressive IV fluid resuscitation in ED. --will replete potassium today --Follow  electrolytes daily to include magnesium  Hx multiple sclerosis --Supportive care, lactulose/Colace as needed for issues with  constipation  Depression: Continue Wellbutrin 300 mg p.o. daily  GERD: Continue Pepcid 20 mg p.o. nightly  Hypothyroidism: Continue levothyroxine 75 mcg p.o. daily   DVT prophylaxis: Coumadin Code Status: Full code Family Communication: Discussed extensively with patient at bedside Disposition Plan: Remain inpatient, IV fluid hydration, close monitoring of blood pressure and telemetry, was from [facility] regarding recent admission for falls, patient unlikely to return, PT/OT for evaluation.   Consultants:   None  Procedures:   None  Antimicrobials:   Ceftriaxone 1/1>>   Subjective: Patient seen and examined bedside, resting comfortably.  She is very anxious, complaining of "abdominal distention".  She is requesting her Colace and lactulose to be restarted as she has issues moving her bowels secondary to her multiple sclerosis.  She believes she contracted the Covid-19 virus from her SNF in which he presented from.  States will not return back to this place.  Also reports severely deconditioned and weak as she cannot even transfer herself to the side of the bed and unable to ambulate.  No other complaints or concerns at this time.  Denies headache, no visual changes, no chest pain, no palpitations, no abdominal pain, no cough/congestion, no fever/chills/night sweats, no nausea/vomiting/diarrhea.  No acute events overnight per nursing staff.  Objective: Vitals:   08/22/19 2200 08/22/19 2313 08/23/19 0454 08/23/19 0611  BP: (!) 100/56 (!) 100/59 (!) 92/44 (!) 103/54  Pulse: 75 66 76 73  Resp:  16 18 18   Temp:  97.9 F (36.6 C) 97.6 F (36.4 C)   TempSrc:      SpO2: 98% 99% 97% 97%    Intake/Output Summary (Last 24 hours) at 08/23/2019 1145 Last data filed at 08/23/2019 0606 Gross per 24 hour  Intake 626.68 ml  Output 350 ml  Net 276.68 ml   There were no vitals filed for this visit.  Examination:  General exam: Appears calm and comfortable, obese,  anxious Respiratory system: Clear to auscultation. Respiratory effort normal.  Oxygenating well on room air Cardiovascular system: S1 & S2 heard, irregularly irregular rhythm, normal rate. No JVD, murmurs, rubs, gallops or clicks.  Nonpitting edema bilateral lower extremities. Gastrointestinal system: Abdomen is nondistended, soft and nontender. No organomegaly or masses felt. Normal bowel sounds heard. Central nervous system: Alert and oriented. No focal neurological deficits. Extremities: Symmetric 5 x 5 power. Skin: No rashes, lesions or ulcers Psychiatry: Judgement and insight appear normal. Mood & affect appropriate.     Data Reviewed: I have personally reviewed following labs and imaging studies  CBC: Recent Labs  Lab 08/22/19 1404 08/23/19 0203  WBC 5.4 5.4  NEUTROABS 2.7  --   HGB 14.2 13.7  HCT 43.0 42.5  MCV 93.5 96.6  PLT 203 [id number]   Basic Metabolic Panel: Recent Labs  Lab 08/22/19 1404 08/23/19 0203  NA 137 140  K 3.7 3.1*  CL 98 105  CO2 29 27  GLUCOSE 81 136*  BUN 22 20  CREATININE 0.51 0.48  CALCIUM 8.6* 8.1*   GFR: Estimated Creatinine Clearance: 64.3 mL/min (by C-G formula based on SCr of 0.48 mg/dL). Liver Function Tests: Recent Labs  Lab 08/22/19 1404 08/23/19 0203  AST 32 34  ALT 36 36  ALKPHOS 143* 125  BILITOT 0.4 0.3  PROT 6.0* 5.1*  ALBUMIN 3.1* 2.8*   No results for input(s): LIPASE, AMYLASE in the last 168 hours. No results  for input(s): AMMONIA in the last 168 hours. Coagulation Profile: Recent Labs  Lab 08/22/19 1404 08/23/19 0203  INR 2.4* 2.7*   Cardiac Enzymes: No results for input(s): CKTOTAL, CKMB, CKMBINDEX, TROPONINI in the last 168 hours. BNP (last 3 results) No results for input(s): PROBNP in the last 8760 hours. HbA1C: No results for input(s): HGBA1C in the last 72 hours. CBG: No results for input(s): GLUCAP in the last 168 hours. Lipid Profile: No results for input(s): CHOL, HDL, LDLCALC, TRIG, CHOLHDL,  LDLDIRECT in the last 72 hours. Thyroid Function Tests: No results for input(s): TSH, T4TOTAL, FREET4, T3FREE, THYROIDAB in the last 72 hours. Anemia Panel: Recent Labs    08/23/19 0854  FERRITIN 133   Sepsis Labs: Recent Labs  Lab 08/22/19 1801  LATICACIDVEN 1.7    Recent Results (from the past 240 hour(s))  Blood culture (routine x 2)     Status: None (Preliminary result)   Collection Time: 08/22/19  6:01 PM   Specimen: BLOOD  Result Value Ref Range Status   Specimen Description   Final    BLOOD LEFT ANTECUBITAL Performed at [facility] [address], [place], [place] [address]    Special Requests   Final    BOTTLES DRAWN AEROBIC AND ANAEROBIC Blood Culture results may not be optimal due to an excessive volume of blood received in culture bottles Performed at [facility] [address] [place], [place] [address]    Culture   Final    NO GROWTH < 12 HOURS Performed at [facility] [address] [place], [place] [address]    Report Status PENDING  Incomplete  Blood culture (routine x 2)     Status: None (Preliminary result)   Collection Time: 08/22/19  6:06 PM   Specimen: BLOOD  Result Value Ref Range Status   Specimen Description   Final    BLOOD BLOOD RIGHT FOREARM Performed at [facility] [address] [place], [place] [address]    Special Requests   Final    BOTTLES DRAWN AEROBIC AND ANAEROBIC Blood Culture adequate volume Performed at [facility] [address], [place], [place] [address]    Culture   Final    NO GROWTH < 12 HOURS Performed at [facility] [address] [place], [place] [address]    Report Status PENDING  Incomplete         Radiology Studies: DG Chest Port 1 View  Result Date: 08/22/2019 CLINICAL DATA:  Shortness of breath EXAM: PORTABLE CHEST 1 VIEW COMPARISON:  07/08/2017 FINDINGS: Stable cardiomegaly. Pulmonary vasculature is  within normal limits. No focal airspace consolidation, pleural effusion, or pneumothorax. IMPRESSION: Stable cardiomegaly without edema or focal airspace disease. Electronically Signed   By: Davina Poke D.O.   On: 08/22/2019 14:09        Scheduled Meds: . buPROPion  300 mg Oral Daily  . famotidine  20 mg Oral QHS  . gabapentin  300 mg Oral TID  . levothyroxine  75 mcg Oral Q0600  . potassium chloride  40 mEq Oral Q4H  . tamsulosin  0.4 mg Oral Daily  . warfarin  2.5 mg Oral ONCE-1800  . Warfarin - Pharmacist Dosing Inpatient   Does not apply q1800   Continuous Infusions: . sodium chloride 100 mL/hr at 08/23/19 1046  . cefTRIAXone (ROCEPHIN)  IV       LOS: 1 day    Time spent: 39 minutes  spent on chart review, discussion with nursing staff, consultants, updating family and interview/physical exam; more than 50% of that time was spent in counseling and/or coordination of care.    Laurene Melendrez J [place] DO [facility] 08/23/2019, 11:45 AM

## 2019-08-23 NOTE — H&P (Signed)
History and Physical    Toni [name] [id number] DOB: 14-Mar-1946 DOA: 08/22/2019  PCP: Prince Solian, MD (Confirm with patient/family/NH records and if not entered, this has to be entered at [facility] point of entry) Patient coming from: Rehab center  I have personally briefly reviewed patient's old medical records in [place]  Chief Complaint: Worsening hypotension  HPI: Toni [name] is a 74 y.o. female with medical history significant of hypertension, atrial fibrillation on warfarin, anxiety, multiple sclerosis and hypothyroidism presented to ED for evaluation of hypotension.  Patient states that she was recently admitted and treated in the hospital status post fall and severe weakness.  Patient was sent to rehab after discharge.  Patient states that she recently started having low blood pressure and yesterday she was evaluated by cardiologist who decreased her Bystolic from 10 mg to 2.5 mg and also held her Lasix and Cardizem but this morning her heart rate went up to 130 so she took 1 dose of Cardizem for her atrial fibrillation with increased heart rate.  Patient further mentioned that today during the physical therapy her blood pressure dropped to 80s.  Patient admits of having some difficulty with urination and also reported that she was tested positive for COVID-19 3 days ago.  Patient otherwise denies fever, chills, headache, sore throat, loss of taste and smell sensation, chest pain, nausea, vomiting, abdominal pain and diarrhea.  ED Course: In ED on arrival patient temperature of 98.6, blood pressure 101/50 that dropped to 98/60, heart rate 68 and oxygen saturation 95% on room air.  Urine was positive for UTI.  In ED patient was managed with 3 L of normal saline and also given 1 g of ceftriaxone.  Review of Systems: As per HPI otherwise 10 point review of systems negative.    Past Medical History:  Diagnosis Date  . Abnormality of gait 03/01/2015  . Anxiety   . Arthritis     "knees" (01/19/2016)  . Atrial fibrillation with RVR ([place]) 01/19/2016  . Back pain   . Chest pain   . Childhood asthma   . Chronic pain    "nerve pain from the MS"  . Constipation   . Depression   . DVT (deep venous thrombosis) ([place]) 1983   "RLE; may have just been phlebitis; it was before the age of dopplers"  . 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 GERD (gastroesophageal reflux disease)   . History of stress test 06/21/10   limited exam with some degree of breast attenuatin of the apex however a component of apical ischemia is not excluded, EF 62%; cardiac cath   . HLD (hyperlipidemia)   . Hx of echocardiogram 04/22/10   EF 55-60%, no valve issues  . Hypertension   . Hypothyroidism   . Joint pain   . Migraine    "none since I stopped beta blocker in 1990s" (01/19/2016)  . Multiple sclerosis ([place])    has had this may 1989-[facility] reg doc  . OSA on CPAP   . Osteoarthritis   . PAF (paroxysmal atrial fibrillation) ([place])    on coumadin; documented on monitor 06/2010  . Pneumonia 11/2011  . Shortness of breath   . Thyroid disease     Past Surgical History:  Procedure Laterality Date  . ANTERIOR CERVICAL DECOMP/DISCECTOMY FUSION  01/2004  . BACK SURGERY    . CARDIAC CATHETERIZATION  06/22/2010   normal  coronary arteries, PAF  . CATARACT EXTRACTION, BILATERAL Bilateral   . CHILD BIRTHS     X2  . DILATION AND CURETTAGE OF UTERUS    . INFUSION PUMP IMPLANTATION  ~ 2009   baclofen infusion in lower abd  . PAIN PUMP REVISION N/A 07/29/2014   Procedure: Baclofen pump replacement;  Surgeon: Erline Levine, MD;  Location: [facility];  Service: Neurosurgery;  Laterality: N/A;  Baclofen pump replacement  . TONSILLECTOMY AND ADENOIDECTOMY    . VARICOSE VEIN SURGERY  ~ 1968  . VENOUS ABLATION  12/16/2010   radiofreq ablation -Dr Elisabeth Cara and [facility]  . WISDOM TOOTH EXTRACTION       reports that she quit smoking about 43 years ago.  Her smoking use included cigarettes. She has a 3.50 pack-year smoking history. She has never used smokeless tobacco. She reports current alcohol use of about 1.0 standard drinks of alcohol per week. She reports that she does not use drugs.  Allergies  Allergen Reactions  . Hydrocodone Nausea And Vomiting    Hydrocodone causes vomiting  . Oxycodone Nausea And Vomiting    Oral oxycodone causes vomiting  . Chlorhexidine Gluconate Rash    Sores at site  . Zoloft [Sertraline] Hives, Swelling and Rash    Family History  Problem Relation Age of Onset  . Coronary artery disease Father        at age 66  . Hyperlipidemia Father   . Thyroid disease Father   . Coronary artery disease Maternal Grandmother   . Depression Maternal Grandmother   . Cancer Paternal Grandmother   . Depression Mother   . Sudden death Mother   . Anxiety disorder Mother   . Alcoholism Son    Prior to Admission medications   Medication Sig Start Date End Date Taking? Authorizing Provider  acetaminophen (TYLENOL) 500 MG tablet Take 500-1,000 mg by mouth every 6 (six) hours as needed for fever or headache (or pain).   Yes [provider]  ALPRAZolam (XANAX) 0.5 MG tablet TAKE 1 TABLET BY MOUTH AT BEDTIME AS NEEDED FOR ANXIETY Patient taking differently: Take 0.5 mg by mouth at bedtime as needed for anxiety.  08/08/19  Yes Donne Hazel, MD  Ascorbic Acid (VITAMIN C) 500 MG CAPS Take 500 mg by mouth daily.   Yes [provider]  baclofen (LIORESAL) 20 MG tablet Once to two tablets a day for breakthrough spasms Patient taking differently: Take 20 mg by mouth 2 (two) times daily as needed for muscle spasms.  08/27/18  Yes Dohmeier, Asencion Partridge, MD  buPROPion (WELLBUTRIN XL) 300 MG 24 hr tablet Take 1 tablet (300 mg total) by mouth daily. 08/23/17  Yes Beasley, Caren D, MD  BYSTOLIC 2.5 MG tablet Take 1 tablet (2.5 mg total) by mouth daily. Hold for SBP<110 08/21/19  Yes Barrett, Evelene Croon, PA-C  Calcium  Carbonate-Vit D-Min (CALTRATE 600+D PLUS MINERALS) 600-800 MG-UNIT TABS Take 2 tablets by mouth 2 (two) times daily.   Yes [provider]  cetirizine (ZYRTEC) 10 MG tablet Take 10 mg by mouth daily as needed for allergies.    Yes [provider]  Cranberry 1000 MG CAPS Take 2 capsules by mouth 2 (two) times daily.    Yes [provider]  diltiazem (CARDIZEM CD) 180 MG 24 hr capsule Hold for SBP<100 Patient taking differently: Take 180 mg by mouth daily. Hold for SBP<100 08/21/19  Yes Barrett, Evelene Croon, PA-C  docusate sodium (COLACE) 100 MG capsule Take 1 capsule (  100 mg total) by mouth daily. 08/12/16  Yes Rai, Ripudeep K, MD  famotidine (PEPCID) 20 MG tablet Take 20 mg by mouth at bedtime.  06/11/18  Yes [provider]  fluticasone (FLONASE) 50 MCG/ACT nasal spray Place 2 sprays into both nostrils daily as needed for allergies.  03/23/15  Yes [provider]  furosemide (LASIX) 20 MG tablet Take 0.5 tablets (10 mg total) by mouth 2 (two) times daily. Hold for SBP<110 Patient taking differently: Take 10 mg by mouth daily. Hold for SBP<110 08/21/19  Yes Barrett, Evelene Croon, PA-C  gabapentin (NEURONTIN) 100 MG capsule Take 200 mg by mouth at bedtime. Given with gabapentin 300mg  qhs for a total dose of 500 mg at bedtime   Yes [provider]  gabapentin (NEURONTIN) 300 MG capsule TAKE 1 CAPSULE BY MOUTH UP  TO 4 TIMES DAILY Patient taking differently: Take 300 mg by mouth 3 (three) times daily.  12/31/18  Yes Dohmeier, Asencion Partridge, MD  GENERLAC 10 GM/15ML SOLN TAKE 15 ML BY MOUTH TWICE DAILY AS NEEDED Patient taking differently: Take 30 g by mouth daily as needed (constipation).  06/25/19  Yes Dohmeier, Asencion Partridge, MD  HYDROcodone-acetaminophen (NORCO/VICODIN) 5-325 MG tablet Take 1 tablet by mouth every 6 (six) hours as needed for moderate pain or severe pain. 08/08/19  Yes Donne Hazel, MD  levothyroxine (SYNTHROID, LEVOTHROID) 75 MCG tablet Take 75 mcg by  mouth daily.   Yes [provider]  Melatonin 3 MG TABS Take 1 tablet (3 mg total) by mouth at bedtime as needed (sleep). 08/08/19  Yes Donne Hazel, MD  omeprazole (PRILOSEC) 20 MG capsule Take 20 mg by mouth daily.    Yes [provider]  ondansetron (ZOFRAN) 4 MG tablet Take 4 mg by mouth every 8 (eight) hours as needed for nausea or vomiting.   Yes [provider]  psyllium (METAMUCIL SMOOTH TEXTURE) 28 % packet Take 1 packet by mouth daily.    Yes [provider]  simvastatin (ZOCOR) 10 MG tablet Take 10 mg by mouth every evening.  03/01/18  Yes [provider]  Tamsulosin HCl (FLOMAX) 0.4 MG CAPS Take 0.4 mg by mouth daily.   Yes [provider]  warfarin (COUMADIN) 5 MG tablet TAKE 1 TABLET BY MOUTH  DAILY AS DIRECTED BY  COUMADIN CLINIC Patient taking differently: Take 2.5-5 mg by mouth as directed. Take 4.5mg  Friday, Saturday,sunday, Monday, Tuesday 4mg  Wednesday thursday 10/30/18  Yes Hilty, Nadean Corwin, MD  zinc gluconate 50 MG tablet Take 50 mg by mouth daily.   Yes [provider]  Cholecalciferol (VITAMIN D) 2000 units tablet Take 1 tablet (2,000 Units total) by mouth 2 (two) times daily. Patient not taking: Reported on 08/22/2019 03/22/17   Dennard Nip D, MD  corticotropin (ACTHAR) 80 UNIT/ML injectable gel INJECT 80 UNITS (1ML) DAILY FOR 10 DAYS. DUE TO MS EXACERBATION AND THEN CAN RESUME NEXT MONTH AT PREVIOUS DOSE. Patient not taking: Reported on 08/22/2019 06/18/19   Dohmeier, Asencion Partridge, MD  SYRINGE/NEEDLE, DISP, 1 ML (BD LUER-LOK SYRINGE) 25G X 5/8" 1 ML MISC Use as directed to inject  Acthar 06/15/15   Dohmeier, Asencion Partridge, MD    Physical Exam: Vitals:   08/22/19 2100 08/22/19 2135 08/22/19 2200 08/22/19 2313  BP: 104/63 (!) 107/59 (!) 100/56 (!) 100/59  Pulse: 77 63 75 66  Resp: 12 14  16   Temp:    97.9 F (36.6 C)  TempSrc:      SpO2: 99% 99% 98%  99%    Constitutional: NAD, calm, comfortable Vitals:   08/22/19  2100 08/22/19 2135 08/22/19 2200 08/22/19 2313  BP: 104/63 (!) 107/59 (!) 100/56 (!) 100/59  Pulse: 77 63 75 66  Resp: 12 14  16   Temp:    97.9 F (36.6 C)  TempSrc:      SpO2: 99% 99% 98% 99%   Eyes: PERRL, lids and conjunctivae normal ENMT: Mucous membranes are moist. Posterior pharynx clear of any exudate or lesions.Normal dentition.  Neck: normal, supple, no masses, no thyromegaly Respiratory: clear to auscultation bilaterally, no wheezing, no crackles. Normal respiratory effort. No accessory muscle use.  Cardiovascular: Regular rate and rhythm, no murmurs / rubs / gallops. No extremity edema. 2+ pedal pulses. No carotid bruits.  Abdomen: no tenderness, no masses palpated. No hepatosplenomegaly. Bowel sounds positive.  Musculoskeletal: no clubbing / cyanosis. No joint deformity upper and lower extremities. Good ROM, no contractures. Normal muscle tone.  Skin: no rashes, lesions, ulcers. No induration Neurologic: CN 2-12 grossly intact. Sensation intact, DTR normal. Strength 3/5 in bilateral lower extremities but 5/5 in bilateral upper extremities  Psychiatric: Normal judgment and insight. Alert and oriented x 3. Normal mood.   Labs on Admission: I have personally reviewed following labs and imaging studies  CBC: Recent Labs  Lab 08/22/19 1404 08/23/19 0203  WBC 5.4 5.4  NEUTROABS 2.7  --   HGB 14.2 13.7  HCT 43.0 42.5  MCV 93.5 96.6  PLT 203 [id number]   Basic Metabolic Panel: Recent Labs  Lab 08/22/19 1404 08/23/19 0203  NA 137 140  K 3.7 3.1*  CL 98 105  CO2 29 27  GLUCOSE 81 136*  BUN 22 20  CREATININE 0.51 0.48  CALCIUM 8.6* 8.1*   GFR: Estimated Creatinine Clearance: 64.3 mL/min (by C-G formula based on SCr of 0.48 mg/dL). Liver Function Tests: Recent Labs  Lab 08/22/19 1404 08/23/19 0203  AST 32 34  ALT 36 36  ALKPHOS 143* 125  BILITOT 0.4 0.3  PROT 6.0* 5.1*  ALBUMIN 3.1* 2.8*   No results for input(s): LIPASE, AMYLASE in the last 168 hours. No  results for input(s): AMMONIA in the last 168 hours. Coagulation Profile: Recent Labs  Lab 08/22/19 1404 08/23/19 0203  INR 2.4* 2.7*   Cardiac Enzymes: No results for input(s): CKTOTAL, CKMB, CKMBINDEX, TROPONINI in the last 168 hours. BNP (last 3 results) No results for input(s): PROBNP in the last 8760 hours. HbA1C: No results for input(s): HGBA1C in the last 72 hours. CBG: No results for input(s): GLUCAP in the last 168 hours. Lipid Profile: No results for input(s): CHOL, HDL, LDLCALC, TRIG, CHOLHDL, LDLDIRECT in the last 72 hours. Thyroid Function Tests: No results for input(s): TSH, T4TOTAL, FREET4, T3FREE, THYROIDAB in the last 72 hours. Anemia Panel: No results for input(s): VITAMINB12, FOLATE, FERRITIN, TIBC, IRON, RETICCTPCT in the last 72 hours. Urine analysis:    Component Value Date/Time   COLORURINE YELLOW 08/22/2019 1958   APPEARANCEUR HAZY (A) 08/22/2019 1958   APPEARANCEUR Clear 12/18/2014 1156   LABSPEC 1.009 08/22/2019 1958   PHURINE 7.0 08/22/2019 1958   GLUCOSEU NEGATIVE 08/22/2019 1958   HGBUR SMALL (A) 08/22/2019 1958   BILIRUBINUR NEGATIVE 08/22/2019 1958   BILIRUBINUR Negative 12/18/2014 1156   [place] 08/22/2019 1958   PROTEINUR NEGATIVE 08/22/2019 1958   UROBILINOGEN 0.2 08/05/2014 0951   NITRITE POSITIVE (A) 08/22/2019 1958   LEUKOCYTESUR LARGE (A) 08/22/2019 1958    Radiological Exams on Admission: DG Chest Port 1 [address]  Result Date: 08/22/2019 CLINICAL DATA:  Shortness of breath EXAM: PORTABLE CHEST 1 VIEW COMPARISON:  07/08/2017 FINDINGS: Stable cardiomegaly. Pulmonary vasculature is within normal limits. No focal airspace consolidation, pleural effusion, or pneumothorax. IMPRESSION: Stable cardiomegaly without edema or focal airspace disease. Electronically Signed   By: Davina Poke D.O.   On: 08/22/2019 14:09    Assessment/Plan Principal Problem:   Acute hypotension Acute hypotension is most likely multifactorial.  It  could be secondary to multiple blood pressure medications, poor oral intake or UTI. Continue holding blood pressure medications. Continue IV fluids at the rate of 100 mL/h. Continue IV ceftriaxone for UTI. Patient is admitted to the telemetry unit for monitoring heart rate and atrial fibrillation.  Active Problems:   Sleep apnea treated with nocturnal BiPAP BiPAP ordered patient could not use BiPAP because of COVID-19 infection.  Scheduled supplementation with nasal cannula as needed.    Acute lower UTI Continue IV ceftriaxone Urine cultures ordered    COVID-19 virus infection Patient has no respiratory symptoms at this time.  Patient also denied body pain, cough and loss of taste and smell sensations. Continue to monitor    DVT prophylaxis: Continue home warfarin Code Status: Full code Admission status: Inpatient/telemetry   Edmonia Lynch MD [facility] Pager   If 7PM-7AM, please contact night-coverage [email or web address] Password  08/23/2019, 4:07 AM

## 2019-08-23 NOTE — Progress Notes (Signed)
Bipap is contraindicated due to patient being Covid +

## 2019-08-23 NOTE — Progress Notes (Signed)
ANTICOAGULATION CONSULT NOTE - Initial Consult  Pharmacy Consult for warfarin Indication: atrial fibrillation  Allergies  Allergen Reactions  . Hydrocodone Nausea And Vomiting    Hydrocodone causes vomiting  . Oxycodone Nausea And Vomiting    Oral oxycodone causes vomiting  . Chlorhexidine Gluconate Rash    Sores at site  . Zoloft [Sertraline] Hives, Swelling and Rash    Vital Signs: Temp: 97.6 F (36.4 C) (01/02 0454) BP: 103/54 (01/02 0611) Pulse Rate: 73 (01/02 0611)  Labs: Recent Labs    08/22/19 1404 08/23/19 0203  HGB 14.2 13.7  HCT 43.0 42.5  PLT 203 165  LABPROT 26.3* 28.8*  INR 2.4* 2.7*  CREATININE 0.51 0.48  TROPONINIHS 5  --     Estimated Creatinine Clearance: 64.3 mL/min (by C-G formula based on SCr of 0.48 mg/dL).   Medical History: Past Medical History:  Diagnosis Date  . Abnormality of gait 03/01/2015  . Anxiety   . Arthritis    "knees" (01/19/2016)  . Atrial fibrillation with RVR ([place]) 01/19/2016  . Back pain   . Chest pain   . Childhood asthma   . Chronic pain    "nerve pain from the MS"  . Constipation   . Depression   . DVT (deep venous thrombosis) ([place]) 1983   "RLE; may have just been phlebitis; it was before the age of dopplers"  . 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 GERD (gastroesophageal reflux disease)   . History of stress test 06/21/10   limited exam with some degree of breast attenuatin of the apex however a component of apical ischemia is not excluded, EF 62%; cardiac cath   . HLD (hyperlipidemia)   . Hx of echocardiogram 04/22/10   EF 55-60%, no valve issues  . Hypertension   . Hypothyroidism   . Joint pain   . Migraine    "none since I stopped beta blocker in 1990s" (01/19/2016)  . Multiple sclerosis ([place])    has had this may 1989-[facility] reg doc  . OSA on CPAP   . Osteoarthritis   . PAF (paroxysmal atrial fibrillation) ([place])    on coumadin; documented on monitor  06/2010  . Pneumonia 11/2011  . Shortness of breath   . Thyroid disease     Medications:  Medications Prior to Admission  Medication Sig Dispense Refill Last Dose  . acetaminophen (TYLENOL) 500 MG tablet Take 500-1,000 mg by mouth every 6 (six) hours as needed for fever or headache (or pain).   Past Week at Unknown time  . ALPRAZolam (XANAX) 0.5 MG tablet TAKE 1 TABLET BY MOUTH AT BEDTIME AS NEEDED FOR ANXIETY (Patient taking differently: Take 0.5 mg by mouth at bedtime as needed for anxiety. ) 5 tablet 0 08/22/2019 at Unknown time  . Ascorbic Acid (VITAMIN C) 500 MG CAPS Take 500 mg by mouth daily.   08/21/2019 at Unknown time  . baclofen (LIORESAL) 20 MG tablet Once to two tablets a day for breakthrough spasms (Patient taking differently: Take 20 mg by mouth 2 (two) times daily as needed for muscle spasms. ) 180 tablet 0 Past Week at Unknown time  . buPROPion (WELLBUTRIN XL) 300 MG 24 hr tablet Take 1 tablet (300 mg total) by mouth daily. 30 tablet 0 08/22/2019 at Unknown time  . BYSTOLIC 2.5 MG tablet Take 1 tablet (2.5 mg total) by mouth daily. Hold for SBP<110   08/22/2019 at  Unknown time  . Calcium Carbonate-Vit D-Min (CALTRATE 600+D PLUS MINERALS) 600-800 MG-UNIT TABS Take 2 tablets by mouth 2 (two) times daily.   08/22/2019 at Unknown time  . cetirizine (ZYRTEC) 10 MG tablet Take 10 mg by mouth daily as needed for allergies.    Past Week at Unknown time  . Cranberry 1000 MG CAPS Take 2 capsules by mouth 2 (two) times daily.    08/22/2019 at Unknown time  . diltiazem (CARDIZEM CD) 180 MG 24 hr capsule Hold for SBP<100 (Patient taking differently: Take 180 mg by mouth daily. Hold for SBP<100) 90 capsule 3 08/22/2019 at Unknown time  . docusate sodium (COLACE) 100 MG capsule Take 1 capsule (100 mg total) by mouth daily. 10 capsule 0 Past Week at Unknown time  . famotidine (PEPCID) 20 MG tablet Take 20 mg by mouth at bedtime.   6 08/21/2019 at Unknown time  . fluticasone (FLONASE) 50 MCG/ACT nasal spray  Place 2 sprays into both nostrils daily as needed for allergies.    Past Week at Unknown time  . furosemide (LASIX) 20 MG tablet Take 0.5 tablets (10 mg total) by mouth 2 (two) times daily. Hold for SBP<110 (Patient taking differently: Take 10 mg by mouth daily. Hold for SBP<110) 30 tablet  08/22/2019 at Unknown time  . gabapentin (NEURONTIN) 100 MG capsule Take 200 mg by mouth at bedtime. Given with gabapentin 300mg  qhs for a total dose of 500 mg at bedtime   08/21/2019 at Unknown time  . gabapentin (NEURONTIN) 300 MG capsule TAKE 1 CAPSULE BY MOUTH UP  TO 4 TIMES DAILY (Patient taking differently: Take 300 mg by mouth 3 (three) times daily. ) 360 capsule 3 08/22/2019 at Unknown time  . GENERLAC 10 GM/15ML SOLN TAKE 15 ML BY MOUTH TWICE DAILY AS NEEDED (Patient taking differently: Take 30 g by mouth daily as needed (constipation). ) 946 mL 1 Past Week at Unknown time  . HYDROcodone-acetaminophen (NORCO/VICODIN) 5-325 MG tablet Take 1 tablet by mouth every 6 (six) hours as needed for moderate pain or severe pain. 8 tablet 0 Past Week at Unknown time  . levothyroxine (SYNTHROID, LEVOTHROID) 75 MCG tablet Take 75 mcg by mouth daily.   08/22/2019 at Unknown time  . Melatonin 3 MG TABS Take 1 tablet (3 mg total) by mouth at bedtime as needed (sleep). 30 tablet 0 08/21/2019 at Unknown time  . omeprazole (PRILOSEC) 20 MG capsule Take 20 mg by mouth daily.    08/22/2019 at Unknown time  . ondansetron (ZOFRAN) 4 MG tablet Take 4 mg by mouth every 8 (eight) hours as needed for nausea or vomiting.   Past Week at Unknown time  . psyllium (METAMUCIL SMOOTH TEXTURE) 28 % packet Take 1 packet by mouth daily.    08/22/2019 at Unknown time  . simvastatin (ZOCOR) 10 MG tablet Take 10 mg by mouth every evening.    08/21/2019 at Unknown time  . Tamsulosin HCl (FLOMAX) 0.4 MG CAPS Take 0.4 mg by mouth daily.   08/22/2019 at Unknown time  . warfarin (COUMADIN) 5 MG tablet TAKE 1 TABLET BY MOUTH  DAILY AS DIRECTED BY  COUMADIN CLINIC  (Patient taking differently: Take 2.5-5 mg by mouth as directed. Take 4.5mg  Friday, Saturday,sunday, Monday, Tuesday 4mg  Wednesday thursday) 90 tablet 1 08/21/2019 at Unknown time  . zinc gluconate 50 MG tablet Take 50 mg by mouth daily.   08/21/2019 at Unknown time  . Cholecalciferol (VITAMIN D) 2000 units tablet Take 1 tablet (2,000 Units  total) by mouth 2 (two) times daily. (Patient not taking: Reported on 08/22/2019)  0 Not Taking at Unknown time  . corticotropin (ACTHAR) 80 UNIT/ML injectable gel INJECT 80 UNITS (1ML) DAILY FOR 10 DAYS. DUE TO MS EXACERBATION AND THEN CAN RESUME NEXT MONTH AT PREVIOUS DOSE. (Patient not taking: Reported on 08/22/2019) 10 mL 1 Not Taking at Unknown time  . SYRINGE/NEEDLE, DISP, 1 ML (BD LUER-LOK SYRINGE) 25G X 5/8" 1 ML MISC Use as directed to inject  Acthar 10 each prn    Scheduled:  . buPROPion  300 mg Oral Daily  . famotidine  20 mg Oral QHS  . gabapentin  300 mg Oral TID  . levothyroxine  75 mcg Oral Q0600  . potassium chloride  40 mEq Oral Q4H  . tamsulosin  0.4 mg Oral Daily  . Warfarin - Pharmacist Dosing Inpatient   Does not apply q1800    Assessment: Patient on Coumadin 5 mg daily exc 2.5mg  on Fri per last anticoag visit note PTA for Afib. Dose for MAG states 4.5mg  daily exc 4mg  on Wed/Thurs? INR therapeutic on admit 2.4. INR remains therapeutic at 2.7. CBC stable.  Goal of Therapy:  INR 2-3  Plan:  Give Coumadin 2.5mg  PO x 1 tonight Monitor daily INR, CBC, s/s of bleed  Davien Malone J 08/23/2019,10:11 AM

## 2019-08-23 NOTE — Progress Notes (Signed)
Two pairs of eyeglasses were delivered to patient. Telemetry monitor battery changed.

## 2019-08-23 NOTE — Progress Notes (Signed)
Patient complaining of upper abdominal tightness and upper anterior thigh tightness, saying it is not related to constipation. Patient asked that fluids be stopped.   RN notified MD of patient symptoms and request. MD ordered fluids be stopped while continuing to monitor BP. RN stopped fluids.

## 2019-08-24 ENCOUNTER — Encounter (HOSPITAL_COMMUNITY): Payer: Self-pay | Admitting: Internal Medicine

## 2019-08-24 LAB — CBC
HCT: 42.9 % (ref 36.0–46.0)
Hemoglobin: 14 g/dL (ref 12.0–15.0)
MCH: 31.3 pg (ref 26.0–34.0)
MCHC: 32.6 g/dL (ref 30.0–36.0)
MCV: 95.8 fL (ref 80.0–100.0)
Platelets: 198 10*3/uL (ref 150–400)
RBC: 4.48 MIL/uL (ref 3.87–5.11)
RDW: 12.6 % (ref 11.5–15.5)
WBC: 5.2 10*3/uL (ref 4.0–10.5)
nRBC: 0 % (ref 0.0–0.2)

## 2019-08-24 LAB — BASIC METABOLIC PANEL
Anion gap: 6 (ref 5–15)
BUN: 14 mg/dL (ref 8–23)
CO2: 29 mmol/L (ref 22–32)
Calcium: 8.6 mg/dL — ABNORMAL LOW (ref 8.9–10.3)
Chloride: 104 mmol/L (ref 98–111)
Creatinine, Ser: 0.46 mg/dL (ref 0.44–1.00)
GFR calc Af Amer: >60 mL/min (ref >60)
GFR calc non Af Amer: >60 mL/min (ref >60)
Glucose, Bld: 81 mg/dL (ref 70–99)
Potassium: 4.2 mmol/L (ref 3.5–5.1)
Sodium: 139 mmol/L (ref 135–145)

## 2019-08-24 LAB — FERRITIN: Ferritin: 127 ng/mL (ref 11–307)

## 2019-08-24 LAB — PROTIME-INR
INR: 3.4 — ABNORMAL HIGH (ref 0.8–1.2)
Prothrombin Time: 34.3 seconds — ABNORMAL HIGH (ref 11.4–15.2)

## 2019-08-24 LAB — C-REACTIVE PROTEIN: CRP: 0.6 mg/dL (ref <1.0)

## 2019-08-24 LAB — MAGNESIUM: Magnesium: 2.1 mg/dL (ref 1.7–2.4)

## 2019-08-24 MED ORDER — ZINC SULFATE 220 (50 ZN) MG PO CAPS
220.0000 mg | ORAL_CAPSULE | Freq: Every day | ORAL | Status: DC
  Administered 2019-08-24 – 2019-08-29 (×6): 220 mg via ORAL
  Filled 2019-08-24 (×6): qty 1

## 2019-08-24 MED ORDER — CALCIUM CARBONATE-VITAMIN D 500-200 MG-UNIT PO TABS
1.0000 | ORAL_TABLET | Freq: Two times a day (BID) | ORAL | Status: DC
  Administered 2019-08-24 – 2019-08-29 (×11): 1 via ORAL
  Filled 2019-08-24 (×11): qty 1

## 2019-08-24 MED ORDER — POLYETHYLENE GLYCOL 3350 17 G PO PACK
17.0000 g | PACK | Freq: Every day | ORAL | Status: DC | PRN
  Administered 2019-08-24 – 2019-08-27 (×2): 17 g via ORAL
  Filled 2019-08-24 (×3): qty 1

## 2019-08-24 MED ORDER — BISACODYL 10 MG RE SUPP
10.0000 mg | Freq: Every day | RECTAL | Status: DC | PRN
  Administered 2019-08-24 – 2019-08-27 (×2): 10 mg via RECTAL
  Filled 2019-08-24 (×2): qty 1

## 2019-08-24 MED ORDER — ACETAMINOPHEN 325 MG PO TABS
650.0000 mg | ORAL_TABLET | Freq: Four times a day (QID) | ORAL | Status: DC | PRN
  Administered 2019-08-24 – 2019-08-29 (×6): 650 mg via ORAL
  Filled 2019-08-24 (×6): qty 2

## 2019-08-24 MED ORDER — ASCORBIC ACID 500 MG PO TABS
500.0000 mg | ORAL_TABLET | Freq: Every day | ORAL | Status: DC
  Administered 2019-08-24 – 2019-08-29 (×6): 500 mg via ORAL
  Filled 2019-08-24 (×6): qty 1

## 2019-08-24 MED ORDER — CRANBERRY 1000 MG PO CAPS: 2.0000 | ORAL_CAPSULE | Freq: Two times a day (BID) | ORAL | Status: DC

## 2019-08-24 MED ORDER — PANTOPRAZOLE SODIUM 40 MG PO TBEC
40.0000 mg | DELAYED_RELEASE_TABLET | Freq: Every day | ORAL | Status: DC
  Administered 2019-08-24 – 2019-08-29 (×6): 40 mg via ORAL
  Filled 2019-08-24 (×6): qty 1

## 2019-08-24 MED ORDER — HYDROCODONE-ACETAMINOPHEN 5-325 MG PO TABS
1.0000 | ORAL_TABLET | Freq: Four times a day (QID) | ORAL | Status: DC | PRN
  Administered 2019-08-27 – 2019-08-28 (×2): 1 via ORAL
  Filled 2019-08-24 (×4): qty 1

## 2019-08-24 MED ORDER — FLUTICASONE PROPIONATE 50 MCG/ACT NA SUSP: 2.0000 | Freq: Every day | NASAL | Status: DC | PRN

## 2019-08-24 MED ORDER — VITAMIN D 25 MCG (1000 UNIT) PO TABS
2000.0000 [IU] | ORAL_TABLET | Freq: Two times a day (BID) | ORAL | Status: DC
  Administered 2019-08-24 – 2019-08-29 (×10): 2000 [IU] via ORAL
  Filled 2019-08-24 (×10): qty 2

## 2019-08-24 MED ORDER — SIMVASTATIN 10 MG PO TABS
10.0000 mg | ORAL_TABLET | Freq: Every evening | ORAL | Status: DC
  Administered 2019-08-24 – 2019-08-29 (×6): 10 mg via ORAL
  Filled 2019-08-24 (×6): qty 1

## 2019-08-24 MED ORDER — ZINC GLUCONATE 50 MG PO TABS: 50.0000 mg | ORAL_TABLET | Freq: Every day | ORAL | Status: DC

## 2019-08-24 MED ORDER — BACLOFEN 20 MG PO TABS
20.0000 mg | ORAL_TABLET | Freq: Two times a day (BID) | ORAL | Status: DC | PRN
  Administered 2019-08-25 – 2019-08-26 (×2): 20 mg via ORAL
  Filled 2019-08-24 (×3): qty 1

## 2019-08-24 MED ORDER — CALTRATE 600+D PLUS MINERALS 600-800 MG-UNIT PO TABS: 2.0000 | ORAL_TABLET | Freq: Two times a day (BID) | ORAL | Status: DC

## 2019-08-24 MED ORDER — WARFARIN SODIUM 1 MG PO TABS
1.0000 mg | ORAL_TABLET | Freq: Once | ORAL | Status: AC
  Administered 2019-08-24: 1 mg via ORAL
  Filled 2019-08-24: qty 1

## 2019-08-24 NOTE — Plan of Care (Signed)

## 2019-08-24 NOTE — Progress Notes (Signed)
PROGRESS NOTE    Toni [name]  [id number] DOB: 08-07-1946 DOA: 08/22/2019 PCP: Prince Solian, MD   Brief Narrative: 74 year old F W/ hx of hypertension, anxiety, hypothyroidism, atrial fibrillation on Coumadin, multiple sclerosis, who presented from [facility] for concerns of hypotension. She was recently admitted and treated in the hospital status post fall and severe weakness.Patient states that she recently started having low blood pressure and was evaluated by cardiologist 1 day PTA who decreased her Bystolic from 10 mg to 2.5 mg and also held her Lasix and Cardizem but morning of admission HR went up to 130 so she took 1 dose of Cardizem for her atrial fibrillation with increased heart rate. Patient further mentioned that during the physical therapy her blood pressure dropped to 80s. Patient admits of having some difficulty with urination and also reported that she was tested positive for COVID-19, 3 days PTA.Patient otherwise denies fever, chills, headache, sore throat, loss of taste and smell sensation, chest pain, nausea, vomiting, abdominal pain and diarrhea.  In ER, temp 98.6, blood pressure 101/50 that dropped to 98/60, heart rate 68 and oxygen saturation 95% on room air. Urine was positive for UTI. In ED patient was managed with 3 L of normal saline and also given 1 g of ceftriaxone  Subjective: Resting on RA C/o headache and nausea this am- attributes to her constipation- reports last time she needed soda sud enema Denies any fever chest pain cough or shortness of breath. Col Rt knee pain- has had during last admission  Assessment & Plan:  COVID-19 virus infection: Respiratory status stable chest x-ray with stable cardiomegaly without edema or focal airspace disease.  Inflammatory markers are stable as below.  Continue supportive care, HOLDING OFF on remdesivir and steroids.  Monitor respiratory status closely.  Recent Labs    08/23/19 0854 08/24/19 0351   FERRITIN 133 127  CRP 0.6 0.6   Lab Results  Component Value Date   SARSCOV2NAA NEGATIVE 08/06/2019   [place] NEGATIVE 08/04/2019   [place] Not Detected 01/18/2019   Acute lower UTI: UA + with LE/Nitrate WBC more than 50, continue ceftriaxone.  Urine culture more than 100,000 Staphylococcus coagulase-negative, 20,000 E. coli, follow-up on culture sensitivity.  Blood culture negative so far.  Remains hemodynamically stable 98% on room air afebrile currently stable at 5.2K.   Hypotension: 92/44 at time of presentation.  Recently seen by cardiology with a decrease in her Bystolic from 10mg  to 2.5 mg daily.  Also concerned this may be related to recent poor oral intake versus UTI.  Patient received 3 L NS bolus in the ED. currently Bystolic Cardizem Lasix on hold, continue metoprolol to prevent tachycardia due to A. fib.  Monitor blood pressure.  Off IV fluids.  Patient was using thigh stocking for hypotension and I have started it back.  Ordered orthostatic hypotension for morning. Cont pt/ot  Permanent atrial fibrillation on anticoagulation with Coumadin.  Tinea metoprolol while holding rest of the home medication due to hypotension.  Continue Coumadin pharmacy dosing.  INR above goal today. Recent Labs  Lab 08/22/19 1404 08/23/19 0203 08/24/19 0351  INR 2.4* 2.7* 3.4*   Hypokalemia: resolved. Multiple sclerosis  Constipation: Started back on aggressive bowel regimen and as needed soapsuds enema.  OSA: when able put her on CPAP.  Hypothyroidism continue Synthroid  GERD continue Pepcid  Depression continue Wellbutrin  Physical deconditioning/right knee pain/recent bilateral lower extremity weakness and pain had extensive work-up in w MRI of the brain C/TL-spine and neurology  evaluation and overall unremarkable-no signs of MS flare and advised outpatient follow-up pain management.  DVT prophylaxis: Coumadin Code Status: full Family Communication: plan of care discussed with  patient at bedside. Disposition Plan: Remains inpatient pending clinical improvement. PT advised skilled nursing facility, patient appears to be reluctant to return to rehab.  Consultants: none Procedures:none Microbiology:100,000 Staphylococcus coagulase-negative, 20,000 E. Coli-sensitivities pending  Antimicrobials: Anti-infectives (From admission, onward)   Start     Dose/Rate Route Frequency Ordered Stop   08/23/19 2000  cefTRIAXone (ROCEPHIN) 1 g in sodium chloride 0.9 % 100 mL IVPB     1 g 200 mL/hr over 30 Minutes Intravenous Every 24 hours 08/23/19 0425     08/22/19 2030  cefTRIAXone (ROCEPHIN) 1 g in sodium chloride 0.9 % 100 mL IVPB     1 g 200 mL/hr over 30 Minutes Intravenous  Once 08/22/19 2029 08/22/19 2220       Objective: Vitals:   08/23/19 1442 08/23/19 1950 08/24/19 0516 08/24/19 0850  BP: 107/70 139/64 128/69 (!) 106/58  Pulse: 87 83 77 84  Resp: 18 20 20 16   Temp: 98.6 F (37 C) 98.4 F (36.9 C) 98.7 F (37.1 C) 98.1 F (36.7 C)  TempSrc: Oral   Oral  SpO2: 100% 95% 99% 98%    Intake/Output Summary (Last 24 hours) at 08/24/2019 1055 Last data filed at 08/24/2019 0900 Gross per 24 hour  Intake 1565.22 ml  Output 2200 ml  Net -634.78 ml   There were no vitals filed for this visit. Weight change:   There is no height or weight on file to calculate BMI.  Intake/Output from previous day: 01/02 0701 - 01/03 0700 In: 1565.2 [P.O.:720; I.V.:845.2] Out: 1300 [Urine:1300] Intake/Output this shift: Total I/O In: -  Out: 900 [Urine:900]  Examination:  General exam: AAOx3, nauseas, Weak appearing. HEENT:Oral mucosa moist, Ear/Nose WNL grossly, dentition normal. Respiratory system: Diminished at the base,no wheezing or crackles,no use of accessory muscle Cardiovascular system: S1 & S2 +, No JVD,. Gastrointestinal system: Abdomen soft, NT,ND, BS+ Nervous System:Alert, awake, moving extremities and grossly nonfocal Extremities: non pitting chronic edema,  distal peripheral pulses palpable.  Skin: No rashes,no icterus. MSK: Normal muscle bulk,tone, power  Medications:  Scheduled Meds: . buPROPion  300 mg Oral Daily  . famotidine  20 mg Oral QHS  . gabapentin  300 mg Oral TID  . levothyroxine  75 mcg Oral Q0600  . loratadine  10 mg Oral Daily  . metoprolol tartrate  12.5 mg Oral BID  . tamsulosin  0.4 mg Oral Daily  . Warfarin - Pharmacist Dosing Inpatient   Does not apply q1800   Continuous Infusions: . cefTRIAXone (ROCEPHIN)  IV 1 g (08/23/19 2036)    Data Reviewed: I have personally reviewed following labs and imaging studies  CBC: Recent Labs  Lab 08/22/19 1404 08/23/19 0203 08/24/19 0351  WBC 5.4 5.4 5.2  NEUTROABS 2.7  --   --   HGB 14.2 13.7 14.0  HCT 43.0 42.5 42.9  MCV 93.5 96.6 95.8  PLT 203 165 [id number]   Basic Metabolic Panel: Recent Labs  Lab 08/22/19 1404 08/23/19 0203 08/24/19 0351  NA 137 140 139  K 3.7 3.1* 4.2  CL 98 105 104  CO2 29 27 29   GLUCOSE 81 136* 81  BUN 22 20 14   CREATININE 0.51 0.48 0.46  CALCIUM 8.6* 8.1* 8.6*  MG  --   --  2.1   GFR: Estimated Creatinine Clearance: 64.3 mL/min (by C-G formula  based on SCr of 0.46 mg/dL). Liver Function Tests: Recent Labs  Lab 08/22/19 1404 08/23/19 0203  AST 32 34  ALT 36 36  ALKPHOS 143* 125  BILITOT 0.4 0.3  PROT 6.0* 5.1*  ALBUMIN 3.1* 2.8*   No results for input(s): LIPASE, AMYLASE in the last 168 hours. No results for input(s): AMMONIA in the last 168 hours. Coagulation Profile: Recent Labs  Lab 08/22/19 1404 08/23/19 0203 08/24/19 0351  INR 2.4* 2.7* 3.4*   Cardiac Enzymes: No results for input(s): CKTOTAL, CKMB, CKMBINDEX, TROPONINI in the last 168 hours. BNP (last 3 results) No results for input(s): PROBNP in the last 8760 hours. HbA1C: No results for input(s): HGBA1C in the last 72 hours. CBG: No results for input(s): GLUCAP in the last 168 hours. Lipid Profile: No results for input(s): CHOL, HDL, LDLCALC, TRIG,  CHOLHDL, LDLDIRECT in the last 72 hours. Thyroid Function Tests: No results for input(s): TSH, T4TOTAL, FREET4, T3FREE, THYROIDAB in the last 72 hours. Anemia Panel: Recent Labs    08/23/19 0854 08/24/19 0351  FERRITIN 133 127   Sepsis Labs: Recent Labs  Lab 08/22/19 1801  LATICACIDVEN 1.7    Recent Results (from the past 240 hour(s))  Blood culture (routine x 2)     Status: None (Preliminary result)   Collection Time: 08/22/19  6:01 PM   Specimen: BLOOD  Result Value Ref Range Status   Specimen Description   Final    BLOOD LEFT ANTECUBITAL Performed at [facility], [place] [address], [place], [place] [address]    Special Requests   Final    BOTTLES DRAWN AEROBIC AND ANAEROBIC Blood Culture results may not be optimal due to an excessive volume of blood received in culture bottles Performed at [facility] [address] [place], [place] [address]    Culture   Final    NO GROWTH 2 DAYS Performed at [facility] [address] [place] [employer] [place], [place] [address]    Report Status PENDING  Incomplete  Blood culture (routine x 2)     Status: None (Preliminary result)   Collection Time: 08/22/19  6:06 PM   Specimen: BLOOD  Result Value Ref Range Status   Specimen Description   Final    BLOOD BLOOD RIGHT FOREARM Performed at [facility] [address] [place], [place] [address]    Special Requests   Final    BOTTLES DRAWN AEROBIC AND ANAEROBIC Blood Culture adequate volume Performed at [facility] [address] [place], [place] [address]    Culture   Final    NO GROWTH 2 DAYS Performed at [facility] [address], [place], [place] [address]    Report Status PENDING  Incomplete  Urine culture     Status: Abnormal (Preliminary result)   Collection Time: 08/22/19  7:58 PM   Specimen: Urine, Random  Result Value Ref Range Status   Specimen Description   Final    URINE, RANDOM  Performed at [facility] [address], [place],  [address]    Special Requests   Final    NONE Performed at [facility], [place] [address] [place],  [address]    Culture (A)  Final    20,000 COLONIES/mL GRAM NEGATIVE RODS CULTURE REINCUBATED FOR BETTER GROWTH Performed at [facility], [place] [address], [place],  [address]    Report Status PENDING  Incomplete  Radiology Studies: DG Chest Port 1 View  Result Date: 08/22/2019 CLINICAL DATA:  Shortness of breath EXAM: PORTABLE CHEST 1 VIEW COMPARISON:  07/08/2017 FINDINGS: Stable cardiomegaly. Pulmonary vasculature is within normal limits. No focal airspace consolidation, pleural effusion, or pneumothorax. IMPRESSION: Stable cardiomegaly without edema or focal airspace disease. Electronically Signed   By: Davina Poke D.O.   On: 08/22/2019 14:09      LOS: 2 days   Time spent: More than 50% of that time was spent in counseling and/or coordination of care.  Antonieta Pert, MD [facility]  08/24/2019, 10:55 AM

## 2019-08-24 NOTE — Evaluation (Signed)
Physical Therapy Evaluation Patient Details Name: Toni [name] MRN: [id number] DOB: 03/26/1946 Today's Date: 08/24/2019   History of Present Illness  Pt is 74 y.o. female with medical history significant of MS diagnosed in 1989, HTN, A.Fib on coumadin. Pt with recent admission in mid-December s/p fall with nondisplaced fx of sacrum - no precautions ordered.  She went to a SNF at d/c.  Pt now readmitted with COVID and hypotension.  Clinical Impression  Pt admitted with above diagnosis. Pt requiring mod A x 2 for transfers to EOB and attempts to stand.  Pt fearful of falling and weak that limited OOB activity.  Pt did have mild drop in BP in sitting, but was asymptomatic.  May benefit from use of STEDY as able. Pt currently with functional limitations due to the deficits listed below (see PT Problem List). Pt will benefit from skilled PT to increase their independence and safety with mobility to allow discharge to the venue listed below.       Follow Up Recommendations SNF    Equipment Recommendations  None recommended by PT(to be determined next venue)    Recommendations for Other Services       Precautions / Restrictions Precautions Precautions: Fall      Mobility  Bed Mobility Overal bed mobility: Needs Assistance Bed Mobility: Supine to Sit;Sit to Supine Rolling: Min assist   Supine to sit: +2 for physical assistance;Mod assist Sit to supine: Mod assist;+2 for physical assistance   General bed mobility comments: cues for technique, increased time, assist to raise up trunk.  On return to bed, pt felt she was at EOB and felt she was falling (she was securly on bed) so she laid back across bed abruptly requiring mod A x 2 to get straight in bed.  Transfers Overall transfer level: Needs assistance Equipment used: 2 person hand held assist Transfers: Sit to/from Stand Sit to Stand: +2 physical assistance;Mod assist         General transfer comment: 2 attempts, partical  stands, cues for technique, feet blocked,  pt fearful of falling  Ambulation/Gait             General Gait Details: deferred  Stairs            Wheelchair Mobility    Modified Rankin (Stroke Patients Only)       Balance Overall balance assessment: Needs assistance Sitting-balance support: Feet supported;No upper extremity supported Sitting balance-Leahy Scale: Fair     Standing balance support: Bilateral upper extremity supported Standing balance-Leahy Scale: Zero                               Pertinent Vitals/Pain Pain Assessment: 0-10 Pain Score: 6  Pain Location: R buttock Pain Descriptors / Indicators: Sore Pain Intervention(s): Limited activity within patient's tolerance   Orthostatic BPs  Supine 130/70  Sitting 115/68  Sitting after 3 min 114/68  Standing unable          Home Living Family/patient expects to be discharged to:: Skilled nursing facility Living Arrangements: Spouse/significant other   Type of Home: Apartment Home Access: Ramped entrance     Home Layout: Two level;Able to live on main level with bedroom/bathroom Home Equipment: Wheelchair - manual;Shower seat;Grab bars - tub/shower      Prior Function Level of Independence: Needs assistance   Gait / Transfers Assistance Needed: Prior to mid-Dec admission pt was able to walk short distances with AD  and spouse assisted with transfers.  Since her fall she has been unable to walk.  Was using STEDY/Standing frame at SNF for transfer but limited due to hypotension.  ADL's / Homemaking Assistance Needed: Prior to admission in December pt needing min A ADLs, since [employer] was max A at SNF        Hand Dominance        Extremity/Trunk Assessment   Upper Extremity Assessment Upper Extremity Assessment: Overall WFL for tasks assessed    Lower Extremity Assessment Lower Extremity Assessment: LLE deficits/detail RLE Deficits / Details: ROM WFL; Strength 4+/5  knee and ankle, 3/5 hips LLE Deficits / Details: ROM WFL; Strength 4+/5 knee and ankle, 3/5 hips       Communication   Communication: No difficulties  Cognition Arousal/Alertness: Awake/alert Behavior During Therapy: WFL for tasks assessed/performed Overall Cognitive Status: Within Functional Limits for tasks assessed                                 General Comments: Pt very pleasant and with good sense of humor      General Comments General comments (skin integrity, edema, etc.): Sat EOB for 8 minutes with SBA    Exercises     Assessment/Plan    PT Assessment Patient needs continued PT services  PT Problem List Decreased mobility;Decreased strength;Decreased activity tolerance;Decreased balance;Pain;Decreased safety awareness;Decreased range of motion;Cardiopulmonary status limiting activity;Decreased knowledge of use of DME       PT Treatment Interventions Therapeutic activities;Therapeutic exercise;Functional mobility training;Patient/family education;DME instruction;Gait training;Balance training    PT Goals (Current goals can be found in the Care Plan section)  Acute Rehab PT Goals Patient Stated Goal: return to SNF at d/c; be able to get OOB PT Goal Formulation: With patient Time For Goal Achievement: 09/07/19 Potential to Achieve Goals: Fair    Frequency Min 2X/week   Barriers to discharge        Co-evaluation               AM-PAC PT "6 Clicks" Mobility  Outcome Measure Help needed turning from your back to your side while in a flat bed without using bedrails?: A Little Help needed moving from lying on your back to sitting on the side of a flat bed without using bedrails?: A Lot Help needed moving to and from a bed to a chair (including a wheelchair)?: Total Help needed standing up from a chair using your arms (e.g., wheelchair or bedside chair)?: Total Help needed to walk in hospital room?: Total Help needed climbing 3-5 steps with a  railing? : Total 6 Click Score: 9    End of Session Equipment Utilized During Treatment: Gait belt Activity Tolerance: Patient limited by fatigue(limited by fear of falling) Patient left: in bed;with call bell/phone within reach;with bed alarm set Nurse Communication: Mobility status;Need for lift equipment PT Visit Diagnosis: History of falling (Z91.81);Difficulty in walking, not elsewhere classified (R26.2);Muscle weakness (generalized) (M62.81);Pain    Time: [id number] PT Time Calculation (min) (ACUTE ONLY): 31 min   Charges:   PT Evaluation $PT Eval Moderate Complexity: 1 Mod          Maggie Font, PT Acute Rehab Services Pager [phone] [facility] [phone] [facility] [phone]   Karlton Lemon 08/24/2019, 1:06 PM

## 2019-08-24 NOTE — Evaluation (Signed)
Occupational Therapy Evaluation Patient Details Name: Toni [name] MRN: [id number] DOB: 1946-07-24 Today's Date: 08/24/2019    History of Present Illness Pt is 74 y.o. female with medical history significant of MS diagnosed in 1989, HTN, A.Fib on coumadin. Pt with recent admission in mid-December s/p fall with nondisplaced fx of sacrum - no precautions ordered.  She went to a SNF at d/c.  Pt now readmitted with COVID and hypotension.   Clinical Impression   At her baseline, pt is w/c dependent, but propels it herself and performs a stand-pivot transfer. She showers with supervision for transfer and dresses with assist for her compression hose. She requires assist to open packages/cut her food and her husband does all cooking and cleaning. Pt is well equipped with DME at home. Pt presents with generalized weakness and impaired fine motor coordination. She requires min to total assist for ADL.Pt reports ability to participate in therapy at SNF was limited by hypotension. Will follow acutely.    Follow Up Recommendations  SNF;Supervision/Assistance - 24 hour    Equipment Recommendations  None recommended by OT    Recommendations for Other Services       Precautions / Restrictions Precautions Precautions: Fall Precaution Comments: monitor BP, consider thigh high compression stockings Restrictions Weight Bearing Restrictions: No      Mobility Bed Mobility Overal bed mobility: Needs Assistance Bed Mobility: Supine to Sit;Sit to Supine Rolling: Min assist   Supine to sit: Max assist Sit to supine: Mod assist   General bed mobility comments: use of rail, HOB up assist for LEs to EOB, to raise trunk and position hips at EOB with bed pad, assisted LEs back into bed  Transfers          General transfer comment: deferred    Balance Overall balance assessment: Needs assistance Sitting-balance support: Feet supported;No upper extremity supported Sitting balance-Leahy Scale:  Fair     Standing balance support: Bilateral upper extremity supported Standing balance-Leahy Scale: Zero                             ADL either performed or assessed with clinical judgement   ADL Overall ADL's : Needs assistance/impaired Eating/Feeding: Minimal assistance;Sitting Eating/Feeding Details (indicate cue type and reason): assisted to open packages, no long needs built up grips Grooming: Minimal assistance;Sitting   Upper Body Bathing: Moderate assistance;Sitting   Lower Body Bathing: Total assistance;Bed level   Upper Body Dressing : Minimal assistance;Sitting   Lower Body Dressing: Total assistance;Bed level                 General ADL Comments: pt reports she only walks for exercise in therapy, was pivoting only and propelling her manual w/c, pt had stopped helping with meal prep as her hand became weaker due to safety concerns     Vision Patient Visual Report: No change from baseline       Perception     Praxis      Pertinent Vitals/Pain Pain Assessment: 0-10 Pain Score: 6  Faces Pain Scale: Hurts little more Pain Location: R buttock Pain Descriptors / Indicators: Sore Pain Intervention(s): Monitored during session;Repositioned     Hand Dominance Right   Extremity/Trunk Assessment Upper Extremity Assessment Upper Extremity Assessment: Generalized weakness;RUE deficits/detail;LUE deficits/detail RUE Deficits / Details: decreased grip strength 3/5, reports decreased sensation;decreased manipulation and fine motor skills, h/o carpal tunnel RUE Coordination: decreased fine motor LUE Deficits / Details: similar to R, but  reports she was going to have her shoulder evaluated by Dr Berenice Primas prior to intial admission LUE Coordination: decreased fine motor      Cervical / Trunk Assessment Cervical / Trunk Assessment: Other exceptions(weakness)   Communication Communication Communication: No difficulties   Cognition Arousal/Alertness:  Awake/alert Behavior During Therapy: WFL for tasks assessed/performed Overall Cognitive Status: Within Functional Limits for tasks assessed                                 General Comments: Pt very pleasant and with good sense of humor   General Comments      Exercises     Shoulder Instructions      Home Living Family/patient expects to be discharged to:: Private residence Living Arrangements: Spouse/significant other Available Help at Discharge: Family;Available 24 hours/day Type of Home: House Home Access: Ramped entrance     Home Layout: Two level;Able to live on main level with bedroom/bathroom     Bathroom Shower/Tub: Other (comment)(barrier free shower)   Bathroom Toilet: Handicapped height Bathroom Accessibility: Yes How Accessible: Accessible via wheelchair [place]: Wheelchair - manual;Grab bars - tub/shower;Bedside commode;Hand held shower head          Prior Functioning/Environment Level of Independence: Needs assistance  Gait / Transfers Assistance Needed: Prior to mid-Dec admission pt was able to walk short distances with AD and spouse assisted with transfers.  Since her fall she has been unable to walk.  Was using STEDY/Standing frame at SNF for transfer but limited due to hypotension. ADL's / Homemaking Assistance Needed: pt can typically transfer and shower in sitting with supervision, she dresses seated on her toilet, her husband assists with compression stockings, husband does all IADL            OT Problem List: Decreased strength;Decreased range of motion;Decreased activity tolerance;Impaired balance (sitting and/or standing);Decreased safety awareness;Decreased knowledge of use of DME or AE;Impaired UE functional use      OT Treatment/Interventions: Self-care/ADL training;Therapeutic exercise;Neuromuscular education;Energy conservation;DME and/or AE instruction;Therapeutic activities;Patient/family education;Balance training     OT Goals(Current goals can be found in the care plan section) Acute Rehab OT Goals Patient Stated Goal: return to PLOF OT Goal Formulation: With patient Time For Goal Achievement: 09/07/19 Potential to Achieve Goals: Good ADL Goals Pt Will Perform Upper Body Bathing: with set-up;with supervision;sitting Pt Will Perform Upper Body Dressing: with set-up;with supervision;sitting Pt Will Transfer to Toilet: with min assist;with transfer board;bedside commode Pt Will Perform Toileting - Clothing Manipulation and hygiene: with min assist;sitting/lateral leans Pt/caregiver will Perform Home Exercise Program: Increased strength;Both right and left upper extremity;With theraband;With theraputty;Independently;With written HEP provided Additional ADL Goal #1: Pt will perform bed mobility with min assist in preparation for ADL.  OT Frequency: Min 2X/week   Barriers to D/C:            Co-evaluation              AM-PAC OT "6 Clicks" Daily Activity     Outcome Measure Help from another person eating meals?: A Little Help from another person taking care of personal grooming?: A Little Help from another person toileting, which includes using toliet, bedpan, or urinal?: Total Help from another person bathing (including washing, rinsing, drying)?: A Lot Help from another person to put on and taking off regular upper body clothing?: A Little Help from another person to put on and taking off regular lower body clothing?: Total 6 Click  Score: 13   End of Session    Activity Tolerance: Patient tolerated treatment well Patient left: in bed;with call bell/phone within reach;with bed alarm set  OT Visit Diagnosis: Unsteadiness on feet (R26.81);Muscle weakness (generalized) (M62.81);Pain                Time: [id number] OT Time Calculation (min): 16 min Charges:  OT General Charges $OT Visit: 1 Visit OT Evaluation $OT Eval Moderate Complexity: 1 Mod  Nestor Lewandowsky, OTR/L Acute Rehabilitation  Services Pager: [phone] Office: [phone]  Malka So 08/24/2019, 1:59 PM

## 2019-08-24 NOTE — Progress Notes (Signed)
[facility] for warfarin Indication: atrial fibrillation  Allergies  Allergen Reactions  . Hydrocodone Nausea And Vomiting    Hydrocodone causes vomiting  . Oxycodone Nausea And Vomiting    Oral oxycodone causes vomiting  . Chlorhexidine Gluconate Rash    Sores at site  . Zoloft [Sertraline] Hives, Swelling and Rash    Vital Signs: Temp: 98.1 F (36.7 C) (01/03 0850) Temp Source: Oral (01/03 0850) BP: 106/58 (01/03 0850) Pulse Rate: 84 (01/03 0850)  Labs: Recent Labs    08/22/19 1404 08/23/19 0203 08/24/19 0351  HGB 14.2 13.7 14.0  HCT 43.0 42.5 42.9  PLT 203 165 198  LABPROT 26.3* 28.8* 34.3*  INR 2.4* 2.7* 3.4*  CREATININE 0.51 0.48 0.46  TROPONINIHS 5  --   --     Estimated Creatinine Clearance: 64.3 mL/min (by C-G formula based on SCr of 0.46 mg/dL).   Assessment: Patient on Coumadin 5 mg daily exc 2.5mg  on Fri per last anticoag visit note PTA for Afib. Dose for MAG states 4.5mg  daily exc 4mg  on Wed/Thurs? INR therapeutic on admit 2.4. INR remains therapeutic at 2.7. CBC stable. 08/24/2019  INR slightly high at 3.4.  5.5 sec jump in protime.  CBC stable and WNL, No bleeding reported.  Goal of Therapy:  INR 2-3  Plan:  Give Coumadin 1 mg PO x 1 tonight Monitor daily INR, CBC, s/s of bleed  Eudelia Bunch, Pharm.D [phone] 08/24/2019 1:17 PM

## 2019-08-25 LAB — BASIC METABOLIC PANEL
Anion gap: 6 (ref 5–15)
BUN: 13 mg/dL (ref 8–23)
CO2: 30 mmol/L (ref 22–32)
Calcium: 8.7 mg/dL — ABNORMAL LOW (ref 8.9–10.3)
Chloride: 106 mmol/L (ref 98–111)
Creatinine, Ser: 0.46 mg/dL (ref 0.44–1.00)
GFR calc Af Amer: >60 mL/min (ref >60)
GFR calc non Af Amer: >60 mL/min (ref >60)
Glucose, Bld: 82 mg/dL (ref 70–99)
Potassium: 4.4 mmol/L (ref 3.5–5.1)
Sodium: 142 mmol/L (ref 135–145)

## 2019-08-25 LAB — CBC
HCT: 44.1 % (ref 36.0–46.0)
Hemoglobin: 13.9 g/dL (ref 12.0–15.0)
MCH: 30.4 pg (ref 26.0–34.0)
MCHC: 31.5 g/dL (ref 30.0–36.0)
MCV: 96.5 fL (ref 80.0–100.0)
Platelets: 183 10*3/uL (ref 150–400)
RBC: 4.57 MIL/uL (ref 3.87–5.11)
RDW: 12.6 % (ref 11.5–15.5)
WBC: 5.6 10*3/uL (ref 4.0–10.5)
nRBC: 0 % (ref 0.0–0.2)

## 2019-08-25 LAB — PROTIME-INR
INR: 2.7 — ABNORMAL HIGH (ref 0.8–1.2)
Prothrombin Time: 29 seconds — ABNORMAL HIGH (ref 11.4–15.2)

## 2019-08-25 LAB — URINE CULTURE: Culture: >=100000 — AB

## 2019-08-25 LAB — MAGNESIUM: Magnesium: 2.2 mg/dL (ref 1.7–2.4)

## 2019-08-25 LAB — FERRITIN: Ferritin: 141 ng/mL (ref 11–307)

## 2019-08-25 LAB — C-REACTIVE PROTEIN: CRP: 0.7 mg/dL (ref <1.0)

## 2019-08-25 MED ORDER — WARFARIN SODIUM 2.5 MG PO TABS
2.5000 mg | ORAL_TABLET | Freq: Once | ORAL | Status: AC
  Administered 2019-08-25: 2.5 mg via ORAL
  Filled 2019-08-25: qty 1

## 2019-08-25 MED ORDER — AMOXICILLIN-POT CLAVULANATE 875-125 MG PO TABS
1.0000 | ORAL_TABLET | Freq: Two times a day (BID) | ORAL | Status: DC
  Administered 2019-08-25: 1 via ORAL
  Filled 2019-08-25: qty 1

## 2019-08-25 MED ORDER — NITROFURANTOIN MONOHYD MACRO 100 MG PO CAPS
100.0000 mg | ORAL_CAPSULE | Freq: Two times a day (BID) | ORAL | Status: DC
  Administered 2019-08-25 – 2019-08-29 (×8): 100 mg via ORAL
  Filled 2019-08-25 (×9): qty 1

## 2019-08-25 MED ORDER — MIDODRINE HCL 2.5 MG PO TABS
2.5000 mg | ORAL_TABLET | Freq: Three times a day (TID) | ORAL | Status: DC
  Administered 2019-08-25 – 2019-08-26 (×3): 2.5 mg via ORAL
  Filled 2019-08-25 (×7): qty 1

## 2019-08-25 NOTE — Care Management Important Message (Signed)
Important Message  Patient Details IM Letter given to Gabriel Earing RN Case Manager to present to the Patient Name: Toni [name] MRN: [id number] Date of Birth: [birth year]   Medicare Important Message Given:  Yes     Kerin Salen 08/25/2019, 12:14 PM

## 2019-08-25 NOTE — Progress Notes (Signed)
PROGRESS NOTE    Toni [name]  [id number] DOB: [birth year] DOA: 08/22/2019 PCP: Prince Solian, MD    Brief Narrative:  74 year old F W/ hx of hypertension, anxiety, hypothyroidism, atrial fibrillation on Coumadin, multiple sclerosis, who presented from [facility] for concerns of hypotension. She was recently admitted and treated in the hospital status post fall and severe weakness.Patient states that she recently started having low blood pressure and was evaluated by cardiologist 1 day PTA who decreased her Bystolic from 10 mg to 2.5 mg and also held her Lasix and Cardizem but morning of admission HR went up to 130 so she took 1 dose of Cardizem for her atrial fibrillation with increased heart rate. Patient further mentioned that during the physical therapy her blood pressure dropped to 80s. Patient admits of having some difficulty with urination and also reported that she was tested positive for COVID-19, 3 days PTA.Patient otherwise denies fever, chills, headache, sore throat, loss of taste and smell sensation, chest pain, nausea, vomiting, abdominal pain and diarrhea.  In ER, temp 98.6, blood pressure 101/50 that dropped to 98/60, heart rate 68 and oxygen saturation 95% on room air. Urine was positive for UTI. In ED patient was managed with 3 L of normal saline and also given 1 g of ceftriaxone, continues on CTX since that time Change to PO abx on 08/24/18  Subjective: Resting on RA, BP still low despite fluids. Has been symptomatic with PT No other complaints at this time. No oxygen requirement. Denies any fever chest pain cough or shortness of breath. Wants to go to rehab, sad she can only go to 1 facility  Assessment & Plan:   Principal Problem:   Acute hypotension Active Problems:   Sleep apnea treated with nocturnal BiPAP   Acute lower UTI   COVID-19 virus infection  COVID-19 virus infection: Respiratory status stable chest x-ray with stable cardiomegaly without  edema or focal airspace disease.  Inflammatory markers are stable as below.  Continue supportive care, HOLDING OFF on remdesivir and steroids.  Monitor respiratory status closely. No changes.  Acute lower UTI: UA + with LE/Nitrate WBC more than 50, continue ceftriaxone.  Urine culture more than 100,000 Staphylococcus coagulase-negative, 20,000 E. coli, follow-up on culture sensitivity.  Blood culture negative so far.  Remains hemodynamically stable 98% on room air. Afebrile. Will not treat Staph in urine, will treat E coli with augmentin.    Hypotension:92/44at time of presentation. Recently seen by cardiology with a decrease in her Bystolic from 10mg to 2.5 mg daily. Also concerned this may be related to recent poor oral intake versus UTI. Patient received 3 L NS bolus in the ED.  - Have stopped bystolic and dilt, switched to metoprolol. Off IV fluids - On thigh stocking.  - Starting midodrine - Cont p/ot - otho vitals tomorrow  Permanent atrial fibrillation on anticoagulation with Coumadin.  Tinea metoprolol while holding rest of the home medication due to hypotension.  Continue Coumadin pharmacy dosing.  INR above goal today.  Hypokalemia: resolved. Multiple sclerosis  Constipation: Started back on aggressive bowel regimen and as needed soapsuds enema.  OSA: when able put her on CPAP.  Hypothyroidism continue Synthroid  GERD continue Pepcid  Depression continue Wellbutrin  Physical deconditioning/right knee pain/recent bilateral lower extremity weakness and pain had extensive work-up in w MRI of the brain C/TL-spine and neurology evaluation and overall unremarkable-no signs of MS flare and advised outpatient follow-up pain management.  DVT prophylaxis: Coumadin Code Status: full Family Communication: plan  of care discussed with patient at bedside. Disposition Plan: Remains inpatient pending clinical improvement. PT advised skilled nursing facility  Consultants:  none Procedures:none Microbiology:Coag Neg Staph and E coli Antimicrobials: Augmentin  Objective: Vitals:   08/24/19 2033 08/24/19 2045 08/25/19 0549 08/25/19 1258  BP: 119/65  (!) 96/58 134/76  Pulse: 97  67 72  Resp: 18  16 17   Temp: 98.7 F (37.1 C)  98.4 F (36.9 C) 99 F (37.2 C)  TempSrc: Oral  Oral   SpO2: 96%  94% 97%  Weight:  87.5 kg    Height:  5\' 3"  (1.6 m)      Intake/Output Summary (Last 24 hours) at 08/25/2019 1511 Last data filed at 08/25/2019 1300 Gross per 24 hour  Intake 920 ml  Output 2800 ml  Net -1880 ml   Filed Weights   08/24/19 2045  Weight: 87.5 kg    Examination:  General exam: Appears calm and comfortable  Respiratory system: Clear to auscultation. Respiratory effort normal. Cardiovascular system: S1 & S2 heard, RRR. No JVD, murmurs, rubs, gallops or clicks. No pedal edema. Gastrointestinal system: Abdomen is nondistended, soft and nontender. No organomegaly or masses felt. Normal bowel sounds heard. Central nervous system: Alert and oriented. No focal neurological deficits. Extremities: Symmetric 5 x 5 power. Skin: No rashes, lesions or ulcers Psychiatry: Judgement and insight appear normal. Mood & affect appropriate.     Data Reviewed: I have personally reviewed following labs and imaging studies  CBC: Recent Labs  Lab 08/22/19 1404 08/23/19 0203 08/24/19 0351 08/25/19 0424  WBC 5.4 5.4 5.2 5.6  NEUTROABS 2.7  --   --   --   HGB 14.2 13.7 14.0 13.9  HCT 43.0 42.5 42.9 44.1  MCV 93.5 96.6 95.8 96.5  PLT 203 165 198 [id number]   Basic Metabolic Panel: Recent Labs  Lab 08/22/19 1404 08/23/19 0203 08/24/19 0351 08/25/19 0424  NA 137 140 139 142  K 3.7 3.1* 4.2 4.4  CL 98 105 104 106  CO2 29 27 29 30   GLUCOSE 81 136* 81 82  BUN 22 20 14 13   CREATININE 0.51 0.48 0.46 0.46  CALCIUM 8.6* 8.1* 8.6* 8.7*  MG  --   --  2.1 2.2   GFR: Estimated Creatinine Clearance: 66.6 mL/min (by C-G formula based on SCr of 0.46 mg/dL). Liver  Function Tests: Recent Labs  Lab 08/22/19 1404 08/23/19 0203  AST 32 34  ALT 36 36  ALKPHOS 143* 125  BILITOT 0.4 0.3  PROT 6.0* 5.1*  ALBUMIN 3.1* 2.8*   No results for input(s): LIPASE, AMYLASE in the last 168 hours. No results for input(s): AMMONIA in the last 168 hours. Coagulation Profile: Recent Labs  Lab 08/22/19 1404 08/23/19 0203 08/24/19 0351 08/25/19 0424  INR 2.4* 2.7* 3.4* 2.7*   Cardiac Enzymes: No results for input(s): CKTOTAL, CKMB, CKMBINDEX, TROPONINI in the last 168 hours. BNP (last 3 results) No results for input(s): PROBNP in the last 8760 hours. HbA1C: No results for input(s): HGBA1C in the last 72 hours. CBG: No results for input(s): GLUCAP in the last 168 hours. Lipid Profile: No results for input(s): CHOL, HDL, LDLCALC, TRIG, CHOLHDL, LDLDIRECT in the last 72 hours. Thyroid Function Tests: No results for input(s): TSH, T4TOTAL, FREET4, T3FREE, THYROIDAB in the last 72 hours. Anemia Panel: Recent Labs    08/24/19 0351 08/25/19 0424  FERRITIN 127 141   Sepsis Labs: Recent Labs  Lab 08/22/19 1801  LATICACIDVEN 1.7    Recent Results (  from the past 240 hour(s))  Blood culture (routine x 2)     Status: None (Preliminary result)   Collection Time: 08/22/19  6:01 PM   Specimen: BLOOD  Result Value Ref Range Status   Specimen Description   Final    BLOOD LEFT ANTECUBITAL Performed at [facility] [address] [place], [place] [address]    Special Requests   Final    BOTTLES DRAWN AEROBIC AND ANAEROBIC Blood Culture results may not be optimal due to an excessive volume of blood received in culture bottles Performed at [facility] [address], [place], [place] [address]    Culture   Final    NO GROWTH 3 DAYS Performed at [facility], [place] [address], [place], [place] [address]    Report Status PENDING  Incomplete  Blood culture (routine x 2)     Status: None (Preliminary result)    Collection Time: 08/22/19  6:06 PM   Specimen: BLOOD  Result Value Ref Range Status   Specimen Description   Final    BLOOD BLOOD RIGHT FOREARM Performed at [facility] [address] [place], [place] [address]    Special Requests   Final    BOTTLES DRAWN AEROBIC AND ANAEROBIC Blood Culture adequate volume Performed at [facility] [address], [place], [place] [address]    Culture   Final    NO GROWTH 3 DAYS Performed at [facility], [place] [address], [place], [place]  [address]    Report Status PENDING  Incomplete  Urine culture     Status: Abnormal   Collection Time: 08/22/19  7:58 PM   Specimen: Urine, Random  Result Value Ref Range Status   Specimen Description   Final    URINE, RANDOM Performed at [facility] [address], [place], [place] [address]    Special Requests   Final    NONE Performed at [facility], [place] [address], [place], [place] [address]    Culture (A)  Final    >=100,000 COLONIES/mL STAPHYLOCOCCUS EPIDERMIDIS 20,000 COLONIES/mL ESCHERICHIA COLI    Report Status 08/25/2019 FINAL  Final   Organism ID, Bacteria STAPHYLOCOCCUS EPIDERMIDIS (A)  Final   Organism ID, Bacteria ESCHERICHIA COLI (A)  Final      Susceptibility   Escherichia coli - MIC*    AMPICILLIN 8 SENSITIVE Sensitive     CEFAZOLIN <=4 SENSITIVE Sensitive     CEFTRIAXONE <=0.25 SENSITIVE Sensitive     CIPROFLOXACIN <=0.25 SENSITIVE Sensitive     GENTAMICIN <=1 SENSITIVE Sensitive     IMIPENEM <=0.25 SENSITIVE Sensitive     NITROFURANTOIN <=16 SENSITIVE Sensitive     TRIMETH/SULFA <=20 SENSITIVE Sensitive     AMPICILLIN/SULBACTAM 4 SENSITIVE Sensitive     PIP/TAZO <=4 SENSITIVE Sensitive     * 20,000 COLONIES/mL ESCHERICHIA COLI   Staphylococcus epidermidis - MIC*    CIPROFLOXACIN >=8 RESISTANT Resistant     GENTAMICIN <=0.5 SENSITIVE Sensitive     NITROFURANTOIN <=16 SENSITIVE Sensitive      OXACILLIN >=4 RESISTANT Resistant     TETRACYCLINE >=16 RESISTANT Resistant     VANCOMYCIN 2 SENSITIVE Sensitive     TRIMETH/SULFA 40 SENSITIVE Sensitive     CLINDAMYCIN <=0.25 SENSITIVE Sensitive     RIFAMPIN <=0.5 SENSITIVE Sensitive     Inducible Clindamycin NEGATIVE Sensitive     * >=100,000 COLONIES/mL STAPHYLOCOCCUS EPIDERMIDIS      Radiology Studies:  No results found.  Scheduled Meds: . amoxicillin-clavulanate  1 tablet Oral Q12H  . ascorbic acid  500 mg Oral Daily  . buPROPion  300 mg Oral Daily  . calcium-vitamin D  1 tablet Oral BID WC  . cholecalciferol  2,000 Units Oral BID  . famotidine  20 mg Oral QHS  . gabapentin  300 mg Oral TID  . levothyroxine  75 mcg Oral Q0600  . loratadine  10 mg Oral Daily  . metoprolol tartrate  12.5 mg Oral BID  . midodrine  2.5 mg Oral TID WC  . pantoprazole  40 mg Oral Daily  . simvastatin  10 mg Oral QPM  . tamsulosin  0.4 mg Oral Daily  . warfarin  2.5 mg Oral ONCE-1800  . Warfarin - Pharmacist Dosing Inpatient   Does not apply q1800  . zinc sulfate  220 mg Oral Daily   Continuous Infusions:   LOS: 3 days    Time spent: 30 minutes  Arma Heading, MD [facility] Pager [phone]  If 7PM-7AM, please contact night-coverage [email or web address] Password [email or web address] 08/25/2019, 3:11 PM

## 2019-08-25 NOTE — Progress Notes (Signed)
[facility] for warfarin Indication: atrial fibrillation  Allergies  Allergen Reactions  . Hydrocodone Nausea And Vomiting    Hydrocodone causes vomiting  . Oxycodone Nausea And Vomiting    Oral oxycodone causes vomiting  . Chlorhexidine Gluconate Rash    Sores at site  . Zoloft [Sertraline] Hives, Swelling and Rash   Vital Signs: Temp: 98.4 F (36.9 C) (01/04 0549) Temp Source: Oral (01/04 0549) BP: 96/58 (01/04 0549) Pulse Rate: 67 (01/04 0549)  Labs: Recent Labs    08/22/19 1404 08/23/19 0203 08/24/19 0351 08/25/19 0424  HGB 14.2 13.7 14.0 13.9  HCT 43.0 42.5 42.9 44.1  PLT 203 165 198 183  LABPROT 26.3* 28.8* 34.3* 29.0*  INR 2.4* 2.7* 3.4* 2.7*  CREATININE 0.51 0.48 0.46 0.46  TROPONINIHS 5  --   --   --    Estimated Creatinine Clearance: 66.6 mL/min (by C-G formula based on SCr of 0.46 mg/dL).  Assessment: Patient on Coumadin 5 mg daily exc 2.5mg  on Fri per last anticoag visit note PTA for Afib. Dose for MAG states 4.5mg  daily exc 4mg  on Wed/Thurs? INR therapeutic on admit 2.4. INR remains therapeutic at 2.7. CBC stable.  Today, 08/25/2019 INR back into therapeutic range - 2.7 Hgb, Plt wnl, no s/s bleed  Goal of Therapy:  INR 2-3  Plan:   Warfarin 2.5mg  today at 1800  Daily Protime/INR, last CBC 1/5  Minda Ditto PharmD 08/25/2019, 10:48 AM

## 2019-08-26 ENCOUNTER — Telehealth: Payer: Self-pay | Admitting: Neurology

## 2019-08-26 LAB — CBC
HCT: 42.4 % (ref 36.0–46.0)
Hemoglobin: 13.7 g/dL (ref 12.0–15.0)
MCH: 30.6 pg (ref 26.0–34.0)
MCHC: 32.3 g/dL (ref 30.0–36.0)
MCV: 94.6 fL (ref 80.0–100.0)
Platelets: 207 10*3/uL (ref 150–400)
RBC: 4.48 MIL/uL (ref 3.87–5.11)
RDW: 12.3 % (ref 11.5–15.5)
WBC: 5.7 10*3/uL (ref 4.0–10.5)
nRBC: 0 % (ref 0.0–0.2)

## 2019-08-26 LAB — BASIC METABOLIC PANEL
Anion gap: 8 (ref 5–15)
BUN: 19 mg/dL (ref 8–23)
CO2: 30 mmol/L (ref 22–32)
Calcium: 8.7 mg/dL — ABNORMAL LOW (ref 8.9–10.3)
Chloride: 99 mmol/L (ref 98–111)
Creatinine, Ser: 0.43 mg/dL — ABNORMAL LOW (ref 0.44–1.00)
GFR calc Af Amer: >60 mL/min (ref >60)
GFR calc non Af Amer: >60 mL/min (ref >60)
Glucose, Bld: 79 mg/dL (ref 70–99)
Potassium: 4 mmol/L (ref 3.5–5.1)
Sodium: 137 mmol/L (ref 135–145)

## 2019-08-26 LAB — PROTIME-INR
INR: 2 — ABNORMAL HIGH (ref 0.8–1.2)
Prothrombin Time: 22.3 seconds — ABNORMAL HIGH (ref 11.4–15.2)

## 2019-08-26 LAB — MAGNESIUM: Magnesium: 2 mg/dL (ref 1.7–2.4)

## 2019-08-26 LAB — C-REACTIVE PROTEIN: CRP: 0.6 mg/dL (ref <1.0)

## 2019-08-26 LAB — FERRITIN: Ferritin: 137 ng/mL (ref 11–307)

## 2019-08-26 MED ORDER — WARFARIN SODIUM 2.5 MG PO TABS
2.5000 mg | ORAL_TABLET | Freq: Once | ORAL | Status: AC
  Administered 2019-08-26: 18:00:00 2.5 mg via ORAL
  Filled 2019-08-26: qty 1

## 2019-08-26 MED ORDER — BACLOFEN 20 MG PO TABS
20.0000 mg | ORAL_TABLET | Freq: Three times a day (TID) | ORAL | Status: DC | PRN
  Administered 2019-08-26 – 2019-08-29 (×8): 20 mg via ORAL
  Filled 2019-08-26 (×8): qty 1

## 2019-08-26 NOTE — Telephone Encounter (Signed)
Pt and daughter called stating that they think her Pump has stopped working and they would like to be advised on what can be done.

## 2019-08-26 NOTE — Progress Notes (Signed)
PROGRESS NOTE    Toni [name]  [id number] DOB: 01-May-1946 DOA: 08/22/2019 PCP: Prince Solian, MD    Brief Narrative:  74 year old F W/ hx of hypertension, anxiety, hypothyroidism, atrial fibrillation on Coumadin, multiple sclerosis, who presented from [facility] for concerns of hypotension. She was recently admitted and treated in the hospital status post fall and severe weakness.Patient states that she recently started having low blood pressure and was evaluated by cardiologist 1 day PTA who decreased her Bystolic from 10 mg to 2.5 mg and also held her Lasix and Cardizem but morning of admission HR went up to 130 so she took 1 dose of Cardizem for her atrial fibrillation with increased heart rate. Patient further mentioned that during the physical therapy her blood pressure dropped to 80s. Patient admits of having some difficulty with urination and also reported that she was tested positive for COVID-19, 3 days PTA.Patient otherwise denies fever, chills, headache, sore throat, loss of taste and smell sensation, chest pain, nausea, vomiting, abdominal pain and diarrhea.  In ER, temp 98.6, blood pressure 101/50 that dropped to 98/60, heart rate 68 and oxygen saturation 95% on room air. Urine was positive for UTI. In ED patient was managed with 3 L of normal saline and also given 1 g of ceftriaxone, continues on CTX since that time Change to PO abx on 08/24/18, macobid for 2 more doses 1/5: complaining of baclofen pump issues, will add supplemental and address as outpatient per neurologist   Subjective: Resting on RA, much improved on midroine. Wants to work with PT. Nervous about going to facility. Concerned about her baclofen pump. Have spoke to her neurologist and will address as outpatient and will continue oral supplemental baclofen in the meantime. Hopefully can discharge soon.   Assessment & Plan:   Principal Problem:   Acute hypotension Active Problems:   Sleep apnea  treated with nocturnal BiPAP   Acute lower UTI   COVID-19 virus infection  COVID-19 virus infection: Respiratory status stable chest x-ray with stable cardiomegaly without edema or focal airspace disease.  Inflammatory markers are stable as below.  Continue supportive care, HOLDING OFF on remdesivir and steroids.  Monitor respiratory status closely. No changes.  Acute lower UTI: UA + with LE/Nitrate WBC more than 50, continue ceftriaxone.  Urine culture more than 100,000 Staphylococcus coagulase-negative, 20,000 E. coli, follow-up on culture sensitivity.  Blood culture negative so far.  Remains hemodynamically stable 98% on room air. Afebrile. Will not treat Staph in urine, will treat E coli with macrobid   Hypotension:92/44at time of presentation. Recently seen by cardiology with a decrease in her Bystolic from 10mg to 2.5 mg daily. Also concerned this may be related to recent poor oral intake versus UTI. Patient received 3 L NS bolus in the ED.  - Have stopped bystolic and dilt, switched to metoprolol. Off IV fluids - On thigh stocking.  - Starting midodrine 2.5mg  TID - Cont p/ot - otho vitals today  Permanent atrial fibrillation on anticoagulation with Coumadin.  Tinea metoprolol while holding rest of the home medication due to hypotension.  Continue Coumadin pharmacy dosing.  Hypokalemia: resolved.  Multiple sclerosis baclofen pump, now using PO PRN baclofen as well, may need adjustment of her dosage  Constipation: Started back on aggressive bowel regimen and as needed soapsuds enema.  OSA: when able put her on CPAP.  Hypothyroidism continue Synthroid  GERD continue Pepcid  Depression continue Wellbutrin  Physical deconditioning/right knee pain/recent bilateral lower extremity weakness and pain had extensive  work-up in w MRI of the brain C/TL-spine and neurology evaluation and overall unremarkable-no signs of MS flare and advised outpatient follow-up pain  management.  DVT prophylaxis: Coumadin Code Status: full Family Communication: plan of care discussed with patient at bedside. Disposition Plan: Remains inpatient pending clinical improvement. PT advised skilled nursing facility  Consultants: none Procedures:none Microbiology:Coag Neg Staph and E coli Antimicrobials: macrobid  Objective: Vitals:   08/25/19 1258 08/25/19 2000 08/26/19 0520 08/26/19 1249  BP: 134/76 130/77 118/71 131/79  Pulse: 72 89 67 77  Resp: 17 19 18 17   Temp: 99 F (37.2 C) 98.6 F (37 C) 98.3 F (36.8 C) 98.6 F (37 C)  TempSrc:  Oral  Oral  SpO2: 97% 96% 98% 99%  Weight:      Height:        Intake/Output Summary (Last 24 hours) at 08/26/2019 1522 Last data filed at 08/26/2019 1251 Gross per 24 hour  Intake 240 ml  Output 2400 ml  Net -2160 ml   Filed Weights   08/24/19 2045  Weight: 87.5 kg    Examination:  General exam: Appears calm and comfortable  Respiratory system: Clear to auscultation. Respiratory effort normal. Cardiovascular system: S1 & S2 heard, RRR. No JVD, murmurs, rubs, gallops or clicks. No pedal edema. Gastrointestinal system: Abdomen is nondistended, soft but slightly tender. No organomegaly or masses felt. Normal bowel sounds heard. Central nervous system: Alert and oriented. No focal neurological deficits. Extremities: Symmetric 5 x 5 power. Skin: No rashes, lesions or ulcers Psychiatry: Judgement and insight appear normal. Mood & affect appropriate.     Data Reviewed: I have personally reviewed following labs and imaging studies  CBC: Recent Labs  Lab 08/22/19 1404 08/23/19 0203 08/24/19 0351 08/25/19 0424 08/26/19 0247  WBC 5.4 5.4 5.2 5.6 5.7  NEUTROABS 2.7  --   --   --   --   HGB 14.2 13.7 14.0 13.9 13.7  HCT 43.0 42.5 42.9 44.1 42.4  MCV 93.5 96.6 95.8 96.5 94.6  PLT 203 165 198 183 [id number]   Basic Metabolic Panel: Recent Labs  Lab 08/22/19 1404 08/23/19 0203 08/24/19 0351 08/25/19 0424  08/26/19 0247  NA 137 140 139 142 137  K 3.7 3.1* 4.2 4.4 4.0  CL 98 105 104 106 99  CO2 29 27 29 30 30   GLUCOSE 81 136* 81 82 79  BUN 22 20 14 13 19   CREATININE 0.51 0.48 0.46 0.46 0.43*  CALCIUM 8.6* 8.1* 8.6* 8.7* 8.7*  MG  --   --  2.1 2.2 2.0   GFR: Estimated Creatinine Clearance: 66.6 mL/min (A) (by C-G formula based on SCr of 0.43 mg/dL (L)). Liver Function Tests: Recent Labs  Lab 08/22/19 1404 08/23/19 0203  AST 32 34  ALT 36 36  ALKPHOS 143* 125  BILITOT 0.4 0.3  PROT 6.0* 5.1*  ALBUMIN 3.1* 2.8*   No results for input(s): LIPASE, AMYLASE in the last 168 hours. No results for input(s): AMMONIA in the last 168 hours. Coagulation Profile: Recent Labs  Lab 08/22/19 1404 08/23/19 0203 08/24/19 0351 08/25/19 0424 08/26/19 0247  INR 2.4* 2.7* 3.4* 2.7* 2.0*   Cardiac Enzymes: No results for input(s): CKTOTAL, CKMB, CKMBINDEX, TROPONINI in the last 168 hours. BNP (last 3 results) No results for input(s): PROBNP in the last 8760 hours. HbA1C: No results for input(s): HGBA1C in the last 72 hours. CBG: No results for input(s): GLUCAP in the last 168 hours. Lipid Profile: No results for input(s): CHOL, HDL,  LDLCALC, TRIG, CHOLHDL, LDLDIRECT in the last 72 hours. Thyroid Function Tests: No results for input(s): TSH, T4TOTAL, FREET4, T3FREE, THYROIDAB in the last 72 hours. Anemia Panel: Recent Labs    08/25/19 0424 08/26/19 0247  FERRITIN 141 137   Sepsis Labs: Recent Labs  Lab 08/22/19 1801  LATICACIDVEN 1.7    Recent Results (from the past 240 hour(s))  Blood culture (routine x 2)     Status: None (Preliminary result)   Collection Time: 08/22/19  6:01 PM   Specimen: BLOOD  Result Value Ref Range Status   Specimen Description   Final    BLOOD LEFT ANTECUBITAL Performed at [facility], [place] [address] [place], [place] [address]    Special Requests   Final    BOTTLES DRAWN AEROBIC AND ANAEROBIC Blood Culture results may not be  optimal due to an excessive volume of blood received in culture bottles Performed at [facility] [address], [place], [place] [address]    Culture   Final    NO GROWTH 4 DAYS Performed at [facility], [place] [address], [place], [place] [address]    Report Status PENDING  Incomplete  Blood culture (routine x 2)     Status: None (Preliminary result)   Collection Time: 08/22/19  6:06 PM   Specimen: BLOOD  Result Value Ref Range Status   Specimen Description   Final    BLOOD BLOOD RIGHT FOREARM Performed at [facility] [address] [place], [place] [address]    Special Requests   Final    BOTTLES DRAWN AEROBIC AND ANAEROBIC Blood Culture adequate volume Performed at [facility] [address], [place], [place] [address]    Culture   Final    NO GROWTH 4 DAYS Performed at [facility], [place] [address] [place], [place] [address]    Report Status PENDING  Incomplete  Urine culture     Status: Abnormal   Collection Time: 08/22/19  7:58 PM   Specimen: Urine, Random  Result Value Ref Range Status   Specimen Description   Final    URINE, RANDOM Performed at [facility] [address] [place], [place] [address]    Special Requests   Final    NONE Performed at [facility], [place] [address], [place], [place] [address]    Culture (A)  Final    >=100,000 COLONIES/mL STAPHYLOCOCCUS EPIDERMIDIS 20,000 COLONIES/mL ESCHERICHIA COLI    Report Status 08/25/2019 FINAL  Final   Organism ID, Bacteria STAPHYLOCOCCUS EPIDERMIDIS (A)  Final   Organism ID, Bacteria ESCHERICHIA COLI (A)  Final      Susceptibility   Escherichia coli - MIC*    AMPICILLIN 8 SENSITIVE Sensitive     CEFAZOLIN <=4 SENSITIVE Sensitive     CEFTRIAXONE <=0.25 SENSITIVE Sensitive     CIPROFLOXACIN <=0.25 SENSITIVE Sensitive     GENTAMICIN <=1 SENSITIVE Sensitive     IMIPENEM <=0.25 SENSITIVE Sensitive      NITROFURANTOIN <=16 SENSITIVE Sensitive     TRIMETH/SULFA <=20 SENSITIVE Sensitive     AMPICILLIN/SULBACTAM 4 SENSITIVE Sensitive     PIP/TAZO <=4 SENSITIVE Sensitive     * 20,000 COLONIES/mL ESCHERICHIA COLI   Staphylococcus epidermidis - MIC*    CIPROFLOXACIN >=8 RESISTANT Resistant     GENTAMICIN <=0.5 SENSITIVE Sensitive     NITROFURANTOIN <=16 SENSITIVE Sensitive     OXACILLIN >=4 RESISTANT Resistant     TETRACYCLINE >=  16 RESISTANT Resistant     VANCOMYCIN 2 SENSITIVE Sensitive     TRIMETH/SULFA 40 SENSITIVE Sensitive     CLINDAMYCIN <=0.25 SENSITIVE Sensitive     RIFAMPIN <=0.5 SENSITIVE Sensitive     Inducible Clindamycin NEGATIVE Sensitive     * >=100,000 COLONIES/mL STAPHYLOCOCCUS EPIDERMIDIS      Radiology Studies: No results found.  Scheduled Meds:  ascorbic acid  500 mg Oral Daily   buPROPion  300 mg Oral Daily   calcium-vitamin D  1 tablet Oral BID WC   cholecalciferol  2,000 Units Oral BID   famotidine  20 mg Oral QHS   gabapentin  300 mg Oral TID   levothyroxine  75 mcg Oral Q0600   loratadine  10 mg Oral Daily   metoprolol tartrate  12.5 mg Oral BID   midodrine  2.5 mg Oral TID WC   nitrofurantoin (macrocrystal-monohydrate)  100 mg Oral Q12H   pantoprazole  40 mg Oral Daily   simvastatin  10 mg Oral QPM   tamsulosin  0.4 mg Oral Daily   warfarin  2.5 mg Oral ONCE-1800   Warfarin - Pharmacist Dosing Inpatient   Does not apply q1800   zinc sulfate  220 mg Oral Daily   Continuous Infusions:   LOS: 4 days   Time spent: 35 minutes  Arma Heading, MD [facility] Pager [phone]  If 7PM-7AM, please contact night-coverage [email or web address] Password [facility] 08/26/2019, 3:22 PM

## 2019-08-26 NOTE — Progress Notes (Signed)
CMT reported pt had 2.28 second pause. Pt sleeping and asymptomatic at this time. Will continue to monitor.

## 2019-08-26 NOTE — Progress Notes (Signed)
[place] for warfarin Indication: atrial fibrillation  Allergies  Allergen Reactions  . Hydrocodone Nausea And Vomiting    Hydrocodone causes vomiting  . Oxycodone Nausea And Vomiting    Oral oxycodone causes vomiting  . Chlorhexidine Gluconate Rash    Sores at site  . Zoloft [Sertraline] Hives, Swelling and Rash   Vital Signs: Temp: 98.3 F (36.8 C) (01/05 0520) BP: 118/71 (01/05 0520) Pulse Rate: 67 (01/05 0520)  Labs: Recent Labs    08/24/19 0351 08/25/19 0424 08/26/19 0247  HGB 14.0 13.9 13.7  HCT 42.9 44.1 42.4  PLT 198 183 207  LABPROT 34.3* 29.0* 22.3*  INR 3.4* 2.7* 2.0*  CREATININE 0.46 0.46 0.43*   Estimated Creatinine Clearance: 66.6 mL/min (A) (by C-G formula based on SCr of 0.43 mg/dL (L)).  Assessment: Patient on Coumadin 5 mg daily exc 2.5mg  on Fri per last anticoag visit note PTA for Afib. Dose for MAG states 4.5mg  daily exc 4mg  on Wed/Thurs? INR therapeutic on admit 2.4. INR remains therapeutic at 2.7. CBC stable.  Today, 08/26/2019 INR decreased to 2, low end of therapeutic range, despite poor po intake Hgb, Plt wnl, no s/s bleed (last CBC 1/5)  Goal of Therapy:  INR 2-3  Plan:   Warfarin 2.5mg  today at 1800  Daily Protime/INR  Minda Ditto PharmD 08/26/2019, 8:23 AM

## 2019-08-26 NOTE — Progress Notes (Signed)
Physical Therapy Treatment Patient Details Name: Toni [name] MRN: [id number] DOB: May 15, 1946 Today's Date: 08/26/2019    History of Present Illness Pt is 74 y.o. female with medical history significant of MS diagnosed in 1989, HTN, A.Fib on coumadin. Pt with recent admission in mid-December s/p fall with nondisplaced fx of sacrum - no precautions ordered.  She went to a SNF at d/c.  Pt now readmitted with COVID and hypotension.    PT Comments    RN reported they need orthostatic BP's.  Assisted pt OOB.  General bed mobility comments: use of rail, HOB up assist for LEs to EOB, to raise trunk and position hips at EOB with bed pad, assisted LEs back into bed. Supine     BP 128/84       HR 95 EOB         BP 119/62      HR 115 Standing   BP 108/56      HR 145 Pt is unable to stand with walker so used a stedy.  "at best I can transfer".  General Gait Details: pt has not amb but a few feet "these past 6 months" per pt "at best I got 10 feet'  Pt is from [facility] for Rehab when she started to have issues with her BP.  At one point tested COVID +.  She plans to return to [place] till her 14th COVID day then move to [facility], stated pt.    Follow Up Recommendations   SNF     Equipment Recommendations       Recommendations for Other Services       Precautions / Restrictions Precautions Precautions: Fall Precaution Comments: monitor BP, consider thigh high compression stockings Restrictions Weight Bearing Restrictions: No Other Position/Activity Restrictions: WBAT    Mobility  Bed Mobility Overal bed mobility: Needs Assistance Bed Mobility: Supine to Sit;Sit to Supine   Sidelying to sit: Mod assist;+2 for safety/equipment Supine to sit: Max assist   Sit to sidelying: Max assist General bed mobility comments: use of rail, HOB up assist for LEs to EOB, to raise trunk and position hips at EOB with bed pad, assisted LEs back into bed  Transfers Overall transfer level: Needs  assistance Equipment used: 2 person hand held assist Transfers: Sit to/from Stand Sit to Stand: +2 physical assistance;Mod assist         General transfer comment: sit to stand with stedy to take BP pt declined to transfer to recliner  Ambulation/Gait             General Gait Details: pt has not amb but a few feet "these past 6 months" per pt "at best I got 10 feet'   Stairs             Wheelchair Mobility    Modified Rankin (Stroke Patients Only)       Balance                                            Cognition Arousal/Alertness: Awake/alert Behavior During Therapy: WFL for tasks assessed/performed Overall Cognitive Status: Within Functional Limits for tasks assessed                                 General Comments: Pt very pleasant and with good sense  of humor      Exercises      General Comments        Pertinent Vitals/Pain Pain Assessment: No/denies pain    Home Living                      Prior Function            PT Goals (current goals can now be found in the care plan section) Progress towards PT goals: Progressing toward goals    Frequency           PT Plan      Co-evaluation              AM-PAC PT "6 Clicks" Mobility   Outcome Measure                   End of Session Equipment Utilized During Treatment: Gait belt Activity Tolerance: Patient limited by fatigue;Other (comment)(orthostatic) Patient left: in bed;with call bell/phone within reach;with bed alarm set Nurse Communication: Mobility status;Need for lift equipment;Other (comment)(pt does not amb/transfers only)       Time: [id number] PT Time Calculation (min) (ACUTE ONLY): 28 min  Charges:  $Therapeutic Activity: 23-37 mins                     Rica Koyanagi  PTA Acute  Rehabilitation Services Pager      [phone] Office      [phone]

## 2019-08-26 NOTE — Progress Notes (Signed)
[employer] area representative called and spoke with RN about patient Baclofen pump. The representative stated that she can only verify dosage/supply; stated that the pump managing provider is the one to communicate with. Patient states that Dr. [name] at [facility] has been the physician managing her pump.  RN communicated with hospital MD regarding communicating with Dr. [name]' office.

## 2019-08-26 NOTE — Progress Notes (Signed)
Patient & patient husband requested we contact the company, [employer], which supplies patient Baclofen pump, as patient and family believe she is having symptoms as she has had in the past which indicate the pump is not working properly. Patient symptoms include abdominal tightness and upper thigh tightness.   RN contacted company and spoke with Lyndee Leo, who stated she would contact a representative to come to the hospital to evaluate the pump.

## 2019-08-27 LAB — C-REACTIVE PROTEIN: CRP: <0.5 mg/dL (ref <1.0)

## 2019-08-27 LAB — CULTURE, BLOOD (ROUTINE X 2)
Culture: NO GROWTH
Culture: NO GROWTH
Special Requests: ADEQUATE

## 2019-08-27 LAB — FERRITIN: Ferritin: 159 ng/mL (ref 11–307)

## 2019-08-27 LAB — PROTIME-INR
INR: 1.5 — ABNORMAL HIGH (ref 0.8–1.2)
Prothrombin Time: 18.3 seconds — ABNORMAL HIGH (ref 11.4–15.2)

## 2019-08-27 MED ORDER — WARFARIN SODIUM 5 MG PO TABS
5.0000 mg | ORAL_TABLET | Freq: Once | ORAL | Status: AC
  Administered 2019-08-27: 13:00:00 5 mg via ORAL
  Filled 2019-08-27: qty 1

## 2019-08-27 MED ORDER — MIDODRINE HCL 5 MG PO TABS
5.0000 mg | ORAL_TABLET | Freq: Three times a day (TID) | ORAL | Status: DC
  Administered 2019-08-27 – 2019-08-29 (×9): 5 mg via ORAL
  Filled 2019-08-27 (×9): qty 1

## 2019-08-27 NOTE — Progress Notes (Signed)
PROGRESS NOTE    Toni [name]  [id number] DOB: [birth year] DOA: 08/22/2019 PCP: Prince Solian, MD    Brief Narrative:  74 year old F W/ hx of hypertension, anxiety, hypothyroidism, atrial fibrillation on Coumadin, multiple sclerosis, who presented from [facility] for concerns of hypotension. She was recently admitted and treated in the hospital status post fall and severe weakness.Patient states that she recently started having low blood pressure and was evaluated by cardiologist 1 day PTA who decreased her Bystolic from 10 mg to 2.5 mg and also held her Lasix and Cardizem but morning of admission HR went up to 130 so she took 1 dose of Cardizem for her atrial fibrillation with increased heart rate. Patient further mentioned that during the physical therapy her blood pressure dropped to 80s. Patient admits of having some difficulty with urination and also reported that she was tested positive for COVID-19, 3 days PTA.Patient otherwise denies fever, chills, headache, sore throat, loss of taste and smell sensation, chest pain, nausea, vomiting, abdominal pain and diarrhea.  In ER, temp 98.6, blood pressure 101/50 that dropped to 98/60, heart rate 68 and oxygen saturation 95% on room air. Urine was positive for UTI. In ED patient was managed with 3 L of normal saline and also given 1 g of ceftriaxone, continues on CTX since that time Change to PO abx on 08/24/18, macobid for 2 more doses 1/5: complaining of baclofen pump issues, will add supplemental and address as outpatient per neurologist   Subjective: Resting on RA, was still slightly orthostatic despite midodrine. Wants to work with PT. Thinks her baclofen pump is actually working but insufficient dose. Otherwise no acute events, patient overall doing well.    Assessment & Plan:   Principal Problem:   Acute hypotension Active Problems:   Sleep apnea treated with nocturnal BiPAP   Acute lower UTI   COVID-19 virus  infection  COVID-19 virus infection: Respiratory status stable chest x-ray with stable cardiomegaly without edema or focal airspace disease.  Inflammatory markers are stable as below.  Continue supportive care, HOLDING OFF on remdesivir and steroids.  Monitor respiratory status closely. No changes. - Doing well  Acute lower UTI: UA + with LE/Nitrate WBC more than 50, continue ceftriaxone.  Urine culture more than 100,000 Staphylococcus coagulase-negative, 20,000 E. coli, follow-up on culture sensitivity.  Blood culture negative so far.  Remains hemodynamically stable 98% on room air. Afebrile. Will not treat Staph in urine, will treat E coli with macrobid - Should finish treatment 1/6   Hypotension:92/44at time of presentation. Recently seen by cardiology with a decrease in her Bystolic from 10mg to 2.5 mg daily. Also concerned this may be related to recent poor oral intake versus UTI. Patient received 3 L NS bolus in the ED.  - Have stopped bystolic and dilt, switched to metoprolol. Off IV fluids - On thigh stocking.  - Update midodrine to 5mg  bid - Cont pt/ot - otho vitals today again  Permanent atrial fibrillation on anticoagulation with Coumadin.  Tinea metoprolol while holding rest of the home medication due to hypotension.  Continue Coumadin pharmacy dosing.  Hypokalemia: resolved.  Multiple sclerosis baclofen pump, now using PO PRN baclofen as well, may need adjustment of her dosage - Neurology office following  Constipation: Started back on aggressive bowel regimen and as needed soapsuds enema.  OSA: when able put her on CPAP.  Hypothyroidism continue Synthroid  GERD continue Pepcid  Depression continue Wellbutrin  Physical deconditioning/right knee pain/recent bilateral lower extremity weakness and pain  had extensive work-up in w MRI of the brain C/TL-spine and neurology evaluation and overall unremarkable-no signs of MS flare and advised outpatient follow-up  pain management.  DVT prophylaxis: Coumadin Code Status: full Family Communication: plan of care discussed with patient at bedside. Disposition Plan: Remains inpatient pending clinical improvement. PT advised skilled nursing facility  Consultants: none Procedures:none Microbiology:Coag Neg Staph and E coli Antimicrobials: macrobid  Objective: Vitals:   08/26/19 1249 08/26/19 2026 08/26/19 2027 08/27/19 0548  BP: 131/79 116/68 116/68 106/62  Pulse: 77 65 65 73  Resp: 17  20 18   Temp: 98.6 F (37 C)  98.5 F (36.9 C) 98.4 F (36.9 C)  TempSrc: Oral  Oral   SpO2: 99%  99% 97%  Weight:      Height:        Intake/Output Summary (Last 24 hours) at 08/27/2019 1324 Last data filed at 08/27/2019 0600 Gross per 24 hour  Intake 140 ml  Output 1300 ml  Net -1160 ml   Filed Weights   08/24/19 2045  Weight: 87.5 kg    Examination:  General exam: Appears calm and comfortable, exam unchanged  Respiratory system: Clear to auscultation. Respiratory effort normal. Cardiovascular system: S1 & S2 heard, RRR. No JVD, murmurs, rubs, gallops or clicks. No pedal edema. Gastrointestinal system: Abdomen is nondistended, soft but slightly tender. No organomegaly or masses felt. Normal bowel sounds heard. Central nervous system: Alert and oriented. No focal neurological deficits. Extremities: Symmetric 5 x 5 power. Skin: No rashes, lesions or ulcers Psychiatry: Judgement and insight appear normal. Mood & affect appropriate.     Data Reviewed: I have personally reviewed following labs and imaging studies  CBC: Recent Labs  Lab 08/22/19 1404 08/23/19 0203 08/24/19 0351 08/25/19 0424 08/26/19 0247  WBC 5.4 5.4 5.2 5.6 5.7  NEUTROABS 2.7  --   --   --   --   HGB 14.2 13.7 14.0 13.9 13.7  HCT 43.0 42.5 42.9 44.1 42.4  MCV 93.5 96.6 95.8 96.5 94.6  PLT 203 165 198 183 [id number]   Basic Metabolic Panel: Recent Labs  Lab 08/22/19 1404 08/23/19 0203 08/24/19 0351 08/25/19 0424  08/26/19 0247  NA 137 140 139 142 137  K 3.7 3.1* 4.2 4.4 4.0  CL 98 105 104 106 99  CO2 29 27 29 30 30   GLUCOSE 81 136* 81 82 79  BUN 22 20 14 13 19   CREATININE 0.51 0.48 0.46 0.46 0.43*  CALCIUM 8.6* 8.1* 8.6* 8.7* 8.7*  MG  --   --  2.1 2.2 2.0   GFR: Estimated Creatinine Clearance: 66.6 mL/min (A) (by C-G formula based on SCr of 0.43 mg/dL (L)). Liver Function Tests: Recent Labs  Lab 08/22/19 1404 08/23/19 0203  AST 32 34  ALT 36 36  ALKPHOS 143* 125  BILITOT 0.4 0.3  PROT 6.0* 5.1*  ALBUMIN 3.1* 2.8*   No results for input(s): LIPASE, AMYLASE in the last 168 hours. No results for input(s): AMMONIA in the last 168 hours. Coagulation Profile: Recent Labs  Lab 08/23/19 0203 08/24/19 0351 08/25/19 0424 08/26/19 0247 08/27/19 0230  INR 2.7* 3.4* 2.7* 2.0* 1.5*   Cardiac Enzymes: No results for input(s): CKTOTAL, CKMB, CKMBINDEX, TROPONINI in the last 168 hours. BNP (last 3 results) No results for input(s): PROBNP in the last 8760 hours. HbA1C: No results for input(s): HGBA1C in the last 72 hours. CBG: No results for input(s): GLUCAP in the last 168 hours. Lipid Profile: No results for input(s): CHOL,  HDL, LDLCALC, TRIG, CHOLHDL, LDLDIRECT in the last 72 hours. Thyroid Function Tests: No results for input(s): TSH, T4TOTAL, FREET4, T3FREE, THYROIDAB in the last 72 hours. Anemia Panel: Recent Labs    08/26/19 0247 08/27/19 0230  FERRITIN 137 159   Sepsis Labs: Recent Labs  Lab 08/22/19 1801  LATICACIDVEN 1.7    Recent Results (from the past 240 hour(s))  Blood culture (routine x 2)     Status: None (Preliminary result)   Collection Time: 08/22/19  6:01 PM   Specimen: BLOOD  Result Value Ref Range Status   Specimen Description   Final    BLOOD LEFT ANTECUBITAL Performed at [facility], [place] [address] [place], [place] [address]    Special Requests   Final    BOTTLES DRAWN AEROBIC AND ANAEROBIC Blood Culture results may not be  optimal due to an excessive volume of blood received in culture bottles Performed at [place] [address], [place], [place] [address]    Culture   Final    NO GROWTH 4 DAYS Performed at [facility], [place] [address], [place], [place] [address]    Report Status PENDING  Incomplete  Blood culture (routine x 2)     Status: None (Preliminary result)   Collection Time: 08/22/19  6:06 PM   Specimen: BLOOD  Result Value Ref Range Status   Specimen Description   Final    BLOOD BLOOD RIGHT FOREARM Performed at [place] [address], [place], [place] [address]    Special Requests   Final    BOTTLES DRAWN AEROBIC AND ANAEROBIC Blood Culture adequate volume Performed at [place] [address] [place], [place] [address]    Culture   Final    NO GROWTH 4 DAYS Performed at [facility], [place] [address], [place], [place] [address]    Report Status PENDING  Incomplete  Urine culture     Status: Abnormal   Collection Time: 08/22/19  7:58 PM   Specimen: Urine, Random  Result Value Ref Range Status   Specimen Description   Final    URINE, RANDOM Performed at [place] [address], [place], [place] [address]    Special Requests   Final    NONE Performed at [facility], [place] [address], [place], [place] [address]    Culture (A)  Final    >=100,000 COLONIES/mL STAPHYLOCOCCUS EPIDERMIDIS 20,000 COLONIES/mL ESCHERICHIA COLI    Report Status 08/25/2019 FINAL  Final   Organism ID, Bacteria STAPHYLOCOCCUS EPIDERMIDIS (A)  Final   Organism ID, Bacteria ESCHERICHIA COLI (A)  Final      Susceptibility   Escherichia coli - MIC*    AMPICILLIN 8 SENSITIVE Sensitive     CEFAZOLIN <=4 SENSITIVE Sensitive     CEFTRIAXONE <=0.25 SENSITIVE Sensitive     CIPROFLOXACIN <=0.25 SENSITIVE Sensitive     GENTAMICIN <=1 SENSITIVE Sensitive     IMIPENEM <=0.25 SENSITIVE Sensitive      NITROFURANTOIN <=16 SENSITIVE Sensitive     TRIMETH/SULFA <=20 SENSITIVE Sensitive     AMPICILLIN/SULBACTAM 4 SENSITIVE Sensitive     PIP/TAZO <=4 SENSITIVE Sensitive     * 20,000 COLONIES/mL ESCHERICHIA COLI   Staphylococcus epidermidis - MIC*    CIPROFLOXACIN >=8 RESISTANT Resistant     GENTAMICIN <=0.5 SENSITIVE Sensitive     NITROFURANTOIN <=16 SENSITIVE Sensitive     OXACILLIN >=4 RESISTANT Resistant  TETRACYCLINE >=16 RESISTANT Resistant     VANCOMYCIN 2 SENSITIVE Sensitive     TRIMETH/SULFA 40 SENSITIVE Sensitive     CLINDAMYCIN <=0.25 SENSITIVE Sensitive     RIFAMPIN <=0.5 SENSITIVE Sensitive     Inducible Clindamycin NEGATIVE Sensitive     * >=100,000 COLONIES/mL STAPHYLOCOCCUS EPIDERMIDIS      Radiology Studies: No results found.  Scheduled Meds: . ascorbic acid  500 mg Oral Daily  . buPROPion  300 mg Oral Daily  . calcium-vitamin D  1 tablet Oral BID WC  . cholecalciferol  2,000 Units Oral BID  . famotidine  20 mg Oral QHS  . gabapentin  300 mg Oral TID  . levothyroxine  75 mcg Oral Q0600  . loratadine  10 mg Oral Daily  . metoprolol tartrate  12.5 mg Oral BID  . midodrine  5 mg Oral TID WC  . nitrofurantoin (macrocrystal-monohydrate)  100 mg Oral Q12H  . pantoprazole  40 mg Oral Daily  . simvastatin  10 mg Oral QPM  . tamsulosin  0.4 mg Oral Daily  . Warfarin - Pharmacist Dosing Inpatient   Does not apply q1800  . zinc sulfate  220 mg Oral Daily   Continuous Infusions:   LOS: 5 days   Time spent: 20 minutes  Arma Heading, MD [facility] Pager [phone]  If 7PM-7AM, please contact night-coverage [email or web address] Password [email or web address] 08/27/2019, 1:24 PM

## 2019-08-27 NOTE — TOC Progression Note (Signed)
Transition of Care [facility]) - Progression Note    Patient Details  Name: Toni [name] MRN: [id number] Date of Birth: 1945-09-27  Transition of [facility]) CM/SW Contact  Purcell Mouton, RN Phone Number: 08/27/2019, 11:34 AM  Clinical Narrative:     Pt is from [facility] and will return in AM.       Expected Discharge Plan and Services                                                 Social Determinants of Health (SDOH) Interventions    Readmission Risk Interventions No flowsheet data found.

## 2019-08-27 NOTE — Progress Notes (Signed)
Occupational Therapy Treatment Patient Details Name: Toni [name] MRN: [id number] DOB: [birth year] Today's Date: 08/27/2019    History of present illness Pt is 74 y.o. female with medical history significant of MS diagnosed in 1989, HTN, A.Fib on coumadin. Pt with recent admission in mid-December s/p fall with nondisplaced fx of sacrum - no precautions ordered.  She went to a SNF at d/c.  Pt now readmitted with COVID and hypotension.   OT comments  Performed level one theraband and theraputty exercises. Pt initially nauseous; was agreeable to sitting EOB at end of session, if nausea subsided.  Pt c/o sharp L chest pain at end of session. Vitals taken and WFLs--see flowsheets.   NT and RN arrived to perform EKG.    Follow Up Recommendations  SNF    Equipment Recommendations  None recommended by OT    Recommendations for Other Services      Precautions / Restrictions Precautions Precautions: Fall Precaution Comments: monitor BP, consider thigh high compression stockings       Mobility Bed Mobility                  Transfers                      Balance                                           ADL either performed or assessed with clinical judgement   ADL                                               Vision       Perception     Praxis      Cognition Arousal/Alertness: Awake/alert Behavior During Therapy: WFL for tasks assessed/performed Overall Cognitive Status: Within Functional Limits for tasks assessed                                          Exercises Exercises: Other exercises Other Exercises Other Exercises: level one theraband and putty issued for FF RUE, bil elbow FF and gross grasp.  Performed x 10 with theraband then c/o pain.  Vitals taken and RN notified.  Pt reports L shoulder painful so only performed AROM for this   Shoulder Instructions       General Comments       Pertinent Vitals/ Pain       Pain Assessment: Faces Faces Pain Scale: Hurts whole lot Pain Location: L chest Pain Descriptors / Indicators: Sharp Pain Intervention(s): (notified RN and took vitals)  Home Living                                          Prior Functioning/Environment              Frequency  Min 2X/week        Progress Toward Goals  OT Goals(current goals can now be found in the care plan section)  Progress towards OT goals: Progressing toward goals     Plan  Co-evaluation                 AM-PAC OT "6 Clicks" Daily Activity     Outcome Measure   Help from another person eating meals?: A Little Help from another person taking care of personal grooming?: A Little Help from another person toileting, which includes using toliet, bedpan, or urinal?: Total Help from another person bathing (including washing, rinsing, drying)?: A Lot Help from another person to put on and taking off regular upper body clothing?: A Little Help from another person to put on and taking off regular lower body clothing?: Total 6 Click Score: 13    End of Session    OT Visit Diagnosis: Unsteadiness on feet (R26.81);Muscle weakness (generalized) (M62.81)   Activity Tolerance Treatment limited secondary to medical complications (Comment)   Patient Left in bed;with call bell/phone within reach;with bed alarm set   Nurse Communication          Time: 1352-1401 OT Time Calculation (min): 9 min  Charges: OT General Charges $OT Visit: 1 Visit OT Treatments $Therapeutic Exercise: 8-22 mins  Ellwood Steidle S, OTR/L Acute Rehabilitation Services 08/27/2019   [place] 08/27/2019, 2:09 PM

## 2019-08-27 NOTE — Progress Notes (Signed)
Toni [name] for warfarin Indication: atrial fibrillation  Allergies  Allergen Reactions  . Hydrocodone Nausea And Vomiting    Hydrocodone causes vomiting  . Oxycodone Nausea And Vomiting    Oral oxycodone causes vomiting  . Chlorhexidine Gluconate Rash    Sores at site  . Zoloft [Sertraline] Hives, Swelling and Rash   Vital Signs: Temp: 98.4 F (36.9 C) (01/06 0548) BP: 106/62 (01/06 0548) Pulse Rate: 73 (01/06 0548)  Labs: Recent Labs    08/25/19 0424 08/26/19 0247 08/27/19 0230  HGB 13.9 13.7  --   HCT 44.1 42.4  --   PLT 183 207  --   LABPROT 29.0* 22.3* 18.3*  INR 2.7* 2.0* 1.5*  CREATININE 0.46 0.43*  --    Estimated Creatinine Clearance: 66.6 mL/min (A) (by C-G formula based on SCr of 0.43 mg/dL (L)).  Assessment: Patient on Coumadin 5 mg daily exc 2.5mg  on Fri per last anticoag visit note PTA for Afib. Dose for MAG states 4.5mg  daily exc 4mg  on Wed/Thurs? INR therapeutic on admit 2.4. INR remains therapeutic at 2.7. CBC stable.  Today, 08/27/2019 INR decreased to 1.5, below therapeutic range, despite poor po intake Hgb, Plt wnl, no s/s bleed (last CBC 1/5)  Goal of Therapy:  INR 2-3  Plan:   Warfarin 5mg  today at 1000  Daily Protime/INR  Minda Ditto PharmD 08/27/2019, 9:02 AM

## 2019-08-27 NOTE — Telephone Encounter (Signed)
[place], can you call back and get some more information as to what is going on?

## 2019-08-27 NOTE — Telephone Encounter (Signed)
Spoke with the patient and she stated that her machine is working. She stated that she feels like her machine isn't pumping enough Baclofen being pumped into her spine. She stated that when Dr. [name] checked her pump it was less then what the computer was saying. Please advise

## 2019-08-27 NOTE — Consult Note (Signed)
   [facility] Inpatient Consult   08/27/2019  AMYA BAUDIN 11/20/1945 [id number]   Patient chart has been reviewed for readmissions less than 30 days and for high risk score, 26%, for unplanned readmissions.  Patient assessed for community [place] [facility] follow up needs.   Chart review reveals patient presented from SNF to hospital. Current disposition not determined. Will continue to follow for progression and disposition plans.  Netta Cedars, MSN, [facility] Liaison Nurse Mobile Phone [phone]  Toll free office [phone]

## 2019-08-28 ENCOUNTER — Encounter (HOSPITAL_COMMUNITY): Payer: Medicare Other

## 2019-08-28 ENCOUNTER — Ambulatory Visit: Payer: Medicare Other | Admitting: Vascular Surgery

## 2019-08-28 LAB — PROTIME-INR
INR: 1.9 — ABNORMAL HIGH (ref 0.8–1.2)
Prothrombin Time: 21.4 seconds — ABNORMAL HIGH (ref 11.4–15.2)

## 2019-08-28 LAB — CORTISOL: Cortisol, Plasma: 7.5 ug/dL

## 2019-08-28 MED ORDER — SODIUM CHLORIDE 1 G PO TABS
1.0000 g | ORAL_TABLET | Freq: Two times a day (BID) | ORAL | Status: DC
  Administered 2019-08-28 – 2019-08-29 (×3): 1 g via ORAL
  Filled 2019-08-28 (×3): qty 1

## 2019-08-28 MED ORDER — WARFARIN SODIUM 2.5 MG PO TABS
2.5000 mg | ORAL_TABLET | Freq: Once | ORAL | Status: AC
  Administered 2019-08-28: 2.5 mg via ORAL
  Filled 2019-08-28: qty 1

## 2019-08-28 MED ORDER — COSYNTROPIN 0.25 MG IJ SOLR
0.2500 mg | Freq: Once | INTRAMUSCULAR | Status: AC
  Administered 2019-08-29: 08:00:00 0.25 mg via INTRAVENOUS
  Filled 2019-08-28: qty 0.25

## 2019-08-28 MED ORDER — ALPRAZOLAM 0.5 MG PO TABS
0.5000 mg | ORAL_TABLET | Freq: Every evening | ORAL | Status: DC | PRN
  Administered 2019-08-28 – 2019-08-29 (×2): 0.5 mg via ORAL
  Filled 2019-08-28 (×2): qty 1

## 2019-08-28 NOTE — Progress Notes (Signed)
PROGRESS NOTE    Toni [name]  [id number] DOB: 1946/02/13 DOA: 08/22/2019 PCP: Prince Solian, MD    Brief Narrative:  74 year old F W/ hx of hypertension, anxiety, hypothyroidism, atrial fibrillation on Coumadin, multiple sclerosis, who presented from [facility] for concerns of hypotension. She was recently admitted and treated in the hospital status post fall and severe weakness.Patient states that she recently started having low blood pressure and was evaluated by cardiologist 1 day PTA who decreased her Bystolic from 10 mg to 2.5 mg and also held her Lasix and Cardizem but morning of admission HR went up to 130 so she took 1 dose of Cardizem for her atrial fibrillation with increased heart rate. Patient further mentioned that during the physical therapy her blood pressure dropped to 80s. Patient admits of having some difficulty with urination and also reported that she was tested positive for COVID-19, 3 days PTA.Patient otherwise denies fever, chills, headache, sore throat, loss of taste and smell sensation, chest pain, nausea, vomiting, abdominal pain and diarrhea.  In ER, temp 98.6, blood pressure 101/50 that dropped to 98/60, heart rate 68 and oxygen saturation 95% on room air. Urine was positive for UTI. In ED patient was managed with 3 L of normal saline and also given 1 g of ceftriaxone, continues on CTX since that time Change to PO abx on 08/24/18, macobid for 2 more doses 1/5: complaining of baclofen pump issues, will add supplemental and address as outpatient per neurologist  1/6: Continues to be orthostatic despite midodrine and increases to this medication  Subjective: Resting on room air, occasionally has some nausea but overall doing alright. Has anxiety about care she will receive at nursing facility. Overall patient is doing well at rest, but becomes veyr orthostatic if she stands up including sig increases to HR. Patient was at home and taking care of herself  prior to admission in Dec. Do believe she wants to be able to walk again. Continues to work with PT. Otherwise no acute events, patient overall doing well.    Assessment & Plan:   Principal Problem:   Acute hypotension Active Problems:   Sleep apnea treated with nocturnal BiPAP   Acute lower UTI   COVID-19 virus infection  COVID-19 virus infection: Respiratory status stable chest x-ray with stable cardiomegaly without edema or focal airspace disease.  Inflammatory markers are stable as below.  Continue supportive care, HOLDING OFF on remdesivir and steroids.  Monitor respiratory status closely. No changes. - Doing well  Acute lower UTI: UA + with LE/Nitrate WBC more than 50, continue ceftriaxone.  Urine culture more than 100,000 Staphylococcus coagulase-negative, 20,000 E. coli, follow-up on culture sensitivity.  Blood culture negative so far.  Remains hemodynamically stable 98% on room air. Afebrile. Will not treat Staph in urine, will treat E coli with macrobid - Should finish treatment 1/6   Hypotension:92/44at time of presentation. Recently seen by cardiology with a decrease in her Bystolic from 10mg to 2.5 mg daily. Also concerned this may be related to recent poor oral intake versus UTI. Patient received 3 L NS bolus in the ED.  - Have stopped bystolic and dilt, switched to metoprolol. Off IV fluids - On thigh stocking.  - Update midodrine to 5mg  bid - Cont pt/ot - otho vitals today again, still low despite midodrine of 5mg  - Does have Afib, but doubt this rise is 2/2 to that, do not believe this is cardiac but could be POTS or adrenal insuff. Sugars and electrolytes are okay,  has been fluid resuscitated, urine and liver and heart function all normal. Will perform cortisol stim test tomorrow. If normal, continue midodrine and salt tabs, DC to SNF, and continue workup as outpatient including tilt table testing and follow-up with neurology to see if this is sympathetic related to  MS  Permanent atrial fibrillation on anticoagulation with Coumadin.  Tinea metoprolol while holding rest of the home medication due to hypotension.  Continue Coumadin pharmacy dosing.  Hypokalemia: resolved.  Multiple sclerosis baclofen pump, now using PO PRN baclofen as well, may need adjustment of her dosage - Neurology office following  Constipation: Started back on aggressive bowel regimen and as needed soapsuds enema.  OSA: when able put her on CPAP.  Hypothyroidism continue Synthroid  GERD continue Pepcid  Depression continue Wellbutrin  Physical deconditioning/right knee pain/recent bilateral lower extremity weakness and pain had extensive work-up in w MRI of the brain C/TL-spine and neurology evaluation and overall unremarkable-no signs of MS flare and advised outpatient follow-up pain management.  DVT prophylaxis: Coumadin Code Status: full Family Communication: plan of care discussed with patient at bedside. Disposition Plan: Remains inpatient pending clinical improvement. PT advised skilled nursing facility  Consultants: none Procedures:none Microbiology:Coag Neg Staph and E coli Antimicrobials: macrobid  Objective: Vitals:   08/27/19 2027 08/27/19 2031 08/28/19 0512 08/28/19 1352  BP: 112/72 (!) 100/52 (!) 101/57 114/72  Pulse: 78 69 67 (!) 41  Resp: 18  18 20   Temp: 98.3 F (36.8 C)  98.1 F (36.7 C)   TempSrc:   Oral   SpO2: 99%  98% 100%  Weight:      Height:        Intake/Output Summary (Last 24 hours) at 08/28/2019 1443 Last data filed at 08/28/2019 0500 Gross per 24 hour  Intake --  Output 1600 ml  Net -1600 ml   Filed Weights   08/24/19 2045  Weight: 87.5 kg    Examination:  General exam: Appears calm and comfortable, exam unchanged from yesterday Respiratory system: Clear to auscultation. Respiratory effort normal. Cardiovascular system: S1 & S2 heard, RRR. No JVD, murmurs, rubs, gallops or clicks. No pedal  edema. Gastrointestinal system: Abdomen is nondistended, soft but slightly tender. No organomegaly or masses felt. Normal bowel sounds heard. Central nervous system: Alert and oriented. No focal neurological deficits. Extremities: Symmetric 5 x 5 power. Age appropriate, but does have hints of deconditioning Skin: No rashes, lesions or ulcers Psychiatry: Judgement and insight appear normal. Mood & affect appropriate.     Data Reviewed: I have personally reviewed following labs and imaging studies  CBC: Recent Labs  Lab 08/22/19 1404 08/23/19 0203 08/24/19 0351 08/25/19 0424 08/26/19 0247  WBC 5.4 5.4 5.2 5.6 5.7  NEUTROABS 2.7  --   --   --   --   HGB 14.2 13.7 14.0 13.9 13.7  HCT 43.0 42.5 42.9 44.1 42.4  MCV 93.5 96.6 95.8 96.5 94.6  PLT 203 165 198 183 [id number]   Basic Metabolic Panel: Recent Labs  Lab 08/22/19 1404 08/23/19 0203 08/24/19 0351 08/25/19 0424 08/26/19 0247  NA 137 140 139 142 137  K 3.7 3.1* 4.2 4.4 4.0  CL 98 105 104 106 99  CO2 29 27 29 30 30   GLUCOSE 81 136* 81 82 79  BUN 22 20 14 13 19   CREATININE 0.51 0.48 0.46 0.46 0.43*  CALCIUM 8.6* 8.1* 8.6* 8.7* 8.7*  MG  --   --  2.1 2.2 2.0   GFR: Estimated Creatinine Clearance: 66.6  mL/min (A) (by C-G formula based on SCr of 0.43 mg/dL (L)). Liver Function Tests: Recent Labs  Lab 08/22/19 1404 08/23/19 0203  AST 32 34  ALT 36 36  ALKPHOS 143* 125  BILITOT 0.4 0.3  PROT 6.0* 5.1*  ALBUMIN 3.1* 2.8*   No results for input(s): LIPASE, AMYLASE in the last 168 hours. No results for input(s): AMMONIA in the last 168 hours. Coagulation Profile: Recent Labs  Lab 08/24/19 0351 08/25/19 0424 08/26/19 0247 08/27/19 0230 08/28/19 0244  INR 3.4* 2.7* 2.0* 1.5* 1.9*   Cardiac Enzymes: No results for input(s): CKTOTAL, CKMB, CKMBINDEX, TROPONINI in the last 168 hours. BNP (last 3 results) No results for input(s): PROBNP in the last 8760 hours. HbA1C: No results for input(s): HGBA1C in the last 72  hours. CBG: No results for input(s): GLUCAP in the last 168 hours. Lipid Profile: No results for input(s): CHOL, HDL, LDLCALC, TRIG, CHOLHDL, LDLDIRECT in the last 72 hours. Thyroid Function Tests: No results for input(s): TSH, T4TOTAL, FREET4, T3FREE, THYROIDAB in the last 72 hours. Anemia Panel: Recent Labs    08/26/19 0247 08/27/19 0230  FERRITIN 137 159   Sepsis Labs: Recent Labs  Lab 08/22/19 1801  LATICACIDVEN 1.7    Recent Results (from the past 240 hour(s))  Blood culture (routine x 2)     Status: None   Collection Time: 08/22/19  6:01 PM   Specimen: BLOOD  Result Value Ref Range Status   Specimen Description   Final    BLOOD LEFT ANTECUBITAL Performed at [facility] [address], [place], [place] [address]    Special Requests   Final    BOTTLES DRAWN AEROBIC AND ANAEROBIC Blood Culture results may not be optimal due to an excessive volume of blood received in culture bottles Performed at [facility] [address], [place], [place] [address]    Culture   Final    NO GROWTH 5 DAYS Performed at [facility], [place] [address], [place], [place] [address]    Report Status 08/27/2019 FINAL  Final  Blood culture (routine x 2)     Status: None   Collection Time: 08/22/19  6:06 PM   Specimen: BLOOD  Result Value Ref Range Status   Specimen Description   Final    BLOOD BLOOD RIGHT FOREARM Performed at [facility] [address] [place], [place] [address]    Special Requests   Final    BOTTLES DRAWN AEROBIC AND ANAEROBIC Blood Culture adequate volume Performed at [facility] [address] [place], [place] [address]    Culture   Final    NO GROWTH 5 DAYS Performed at [facility], [place] [address], [place], [place] [address]    Report Status 08/27/2019 FINAL  Final  Urine culture     Status: Abnormal   Collection Time: 08/22/19  7:58 PM   Specimen: Urine, Random   Result Value Ref Range Status   Specimen Description   Final    URINE, RANDOM Performed at [facility] [address], [place], [place] [address]    Special Requests   Final    NONE Performed at [facility], [place] [address], [place],  [address]    Culture (A)  Final    >=100,000 COLONIES/mL STAPHYLOCOCCUS EPIDERMIDIS 20,000 COLONIES/mL ESCHERICHIA COLI    Report Status 08/25/2019 FINAL  Final   Organism ID, Bacteria STAPHYLOCOCCUS EPIDERMIDIS (A)  Final  Organism ID, Bacteria ESCHERICHIA COLI (A)  Final      Susceptibility   Escherichia coli - MIC*    AMPICILLIN 8 SENSITIVE Sensitive     CEFAZOLIN <=4 SENSITIVE Sensitive     CEFTRIAXONE <=0.25 SENSITIVE Sensitive     CIPROFLOXACIN <=0.25 SENSITIVE Sensitive     GENTAMICIN <=1 SENSITIVE Sensitive     IMIPENEM <=0.25 SENSITIVE Sensitive     NITROFURANTOIN <=16 SENSITIVE Sensitive     TRIMETH/SULFA <=20 SENSITIVE Sensitive     AMPICILLIN/SULBACTAM 4 SENSITIVE Sensitive     PIP/TAZO <=4 SENSITIVE Sensitive     * 20,000 COLONIES/mL ESCHERICHIA COLI   Staphylococcus epidermidis - MIC*    CIPROFLOXACIN >=8 RESISTANT Resistant     GENTAMICIN <=0.5 SENSITIVE Sensitive     NITROFURANTOIN <=16 SENSITIVE Sensitive     OXACILLIN >=4 RESISTANT Resistant     TETRACYCLINE >=16 RESISTANT Resistant     VANCOMYCIN 2 SENSITIVE Sensitive     TRIMETH/SULFA 40 SENSITIVE Sensitive     CLINDAMYCIN <=0.25 SENSITIVE Sensitive     RIFAMPIN <=0.5 SENSITIVE Sensitive     Inducible Clindamycin NEGATIVE Sensitive     * >=100,000 COLONIES/mL STAPHYLOCOCCUS EPIDERMIDIS      Radiology Studies: No results found.  Scheduled Meds: . ascorbic acid  500 mg Oral Daily  . buPROPion  300 mg Oral Daily  . calcium-vitamin D  1 tablet Oral BID WC  . cholecalciferol  2,000 Units Oral BID  . [START ON 08/29/2019] cosyntropin  0.25 mg Intravenous Once  . famotidine  20 mg Oral QHS  . gabapentin  300 mg Oral TID   . levothyroxine  75 mcg Oral Q0600  . loratadine  10 mg Oral Daily  . metoprolol tartrate  12.5 mg Oral BID  . midodrine  5 mg Oral TID WC  . nitrofurantoin (macrocrystal-monohydrate)  100 mg Oral Q12H  . pantoprazole  40 mg Oral Daily  . simvastatin  10 mg Oral QPM  . sodium chloride  1 g Oral BID WC  . tamsulosin  0.4 mg Oral Daily  . warfarin  2.5 mg Oral ONCE-1800  . Warfarin - Pharmacist Dosing Inpatient   Does not apply q1800  . zinc sulfate  220 mg Oral Daily   Continuous Infusions:   LOS: 6 days   Time spent: 30 minutes  Arma Heading, MD [facility] Pager [phone]  If 7PM-7AM, please contact night-coverage [email or web address] Password [facility] 08/28/2019, 2:43 PM

## 2019-08-28 NOTE — Progress Notes (Signed)
Toni [name] for warfarin Indication: atrial fibrillation  Allergies  Allergen Reactions  . Hydrocodone Nausea And Vomiting    Hydrocodone causes vomiting  . Oxycodone Nausea And Vomiting    Oral oxycodone causes vomiting  . Chlorhexidine Gluconate Rash    Sores at site  . Zoloft [Sertraline] Hives, Swelling and Rash   Vital Signs: Temp: 98.1 F (36.7 C) (01/07 0512) Temp Source: Oral (01/07 0512) BP: 101/57 (01/07 0512) Pulse Rate: 67 (01/07 0512)  Labs: Recent Labs    08/26/19 0247 08/27/19 0230 08/28/19 0244  HGB 13.7  --   --   HCT 42.4  --   --   PLT 207  --   --   LABPROT 22.3* 18.3* 21.4*  INR 2.0* 1.5* 1.9*  CREATININE 0.43*  --   --    Estimated Creatinine Clearance: 66.6 mL/min (A) (by C-G formula based on SCr of 0.43 mg/dL (L)).  Assessment: Patient on Warfarin 5mg  daily except 2.5mg  on Fri per last anticoag visit note PTA for atrial fibrillation. Dose from MAG states 4.5mg  daily except 4mg  on Wed/Thurs. INR therapeutic on admit at 2.4. Pharmacy consulted for Warfarin management while patient in the hospital.   Today, 08/28/2019 INR increased to 1.9 today CBC (1/5): Hgb, Pltc WNL  No major drug interactions noted No bleeding issues noted  Goal of Therapy:  INR 2-3  Plan:   Warfarin 2.5mg  PO x 1 today   Daily PT/INR  CBC in AM  Monitor closely for s/sx of bleeding   Lindell Spar, PharmD, BCPS Clinical Pharmacist  08/28/2019, 12:40 PM

## 2019-08-28 NOTE — Progress Notes (Signed)
Physical Therapy Treatment Patient Details Name: Toni [name] MRN: [id number] DOB: [birth year] Today's Date: 08/28/2019    History of Present Illness Pt is 74 y.o. female with medical history significant of MS diagnosed in 1989, HTN, A.Fib on coumadin. Pt with recent admission in mid-December s/p fall with nondisplaced fx of sacrum - no precautions ordered.  She went to a SNF at d/c.  Pt now readmitted with COVID and hypotension.    PT Comments    RN reported pt needs Orthostatic BP's taken.  Pt is not able to stand and has not walked these past 6 months "at best 10 feet" at [facility].  Used STEDY to achieve partial stance. Supine       BP 122/84             HR 96 irregular EOB           BP 117/84            HR 128 irregular Partial standing BP 104/51     HR 148 - 165 (on tele) Pt requires Total Care as she is unable to self transfer from bed to chair to [facility] as prior claimed and has issues of urine incontin with each movement.  Pt requires Total Care for hygiene and don/Dof Thigh High's.   I just don't see how she will function if she is D/C to home.  Pt will need 24/7 care/assist. LPT rec SNF.   Follow Up Recommendations  SNF     Equipment Recommendations  None recommended by PT    Recommendations for Other Services       Precautions / Restrictions Precautions Precautions: Fall Precaution Comments: monitor BP, consider thigh high compression stockings Restrictions Weight Bearing Restrictions: No Other Position/Activity Restrictions: WBAT    Mobility  Bed Mobility Overal bed mobility: Needs Assistance Bed Mobility: Supine to Sit;Sit to Supine   Sidelying to sit: Mod assist;Max assist   Sit to supine: Max assist;Total assist   General bed mobility comments: pt tries her hardest using rails but still requires much assist esp back to bed due to weakness and BMI  Transfers Overall transfer level: Needs assistance   Transfers: Sit to/from Stand Sit to Stand: Mod  assist;Max assist         General transfer comment: sit to stand with stedy to take BP pt declined to transfer to recliner.  required increased assist vs last session  Ambulation/Gait             General Gait Details: pt has not amb but a few feet "these past 6 months" per pt "at best I got 10 feet'   Stairs             Wheelchair Mobility    Modified Rankin (Stroke Patients Only)       Balance                                            Cognition Arousal/Alertness: Awake/alert   Overall Cognitive Status: Within Functional Limits for tasks assessed                                 General Comments: Pt very pleasant and with good sense of humor      Exercises      General Comments  Pertinent Vitals/Pain Pain Assessment: Faces Faces Pain Scale: Hurts a little bit Pain Location: L chest Pain Descriptors / Indicators: Sharp Pain Intervention(s): Monitored during session    Home Living                      Prior Function            PT Goals (current goals can now be found in the care plan section) Progress towards PT goals: Progressing toward goals    Frequency           PT Plan Current plan remains appropriate    Co-evaluation              AM-PAC PT "6 Clicks" Mobility   Outcome Measure  Help needed turning from your back to your side while in a flat bed without using bedrails?: A Lot Help needed moving from lying on your back to sitting on the side of a flat bed without using bedrails?: A Lot Help needed moving to and from a bed to a chair (including a wheelchair)?: Total Help needed standing up from a chair using your arms (e.g., wheelchair or bedside chair)?: Total Help needed to walk in hospital room?: Total Help needed climbing 3-5 steps with a railing? : Total 6 Click Score: 8    End of Session Equipment Utilized During Treatment: Gait belt Activity Tolerance: Patient  limited by fatigue;Other (comment)(hypotensive and tacky)   Nurse Communication: Mobility status;Need for lift equipment;Other (comment) PT Visit Diagnosis: History of falling (Z91.81);Difficulty in walking, not elsewhere classified (R26.2);Muscle weakness (generalized) (M62.81);Pain     Time: [id number] PT Time Calculation (min) (ACUTE ONLY): 28 min  Charges:  $Gait Training: 8-22 mins $Therapeutic Activity: 8-22 mins                     {Reyes Fifield  PTA Acute  Rehabilitation Services Pager      [phone] Office      [phone]

## 2019-08-29 DIAGNOSIS — S3210XS Unspecified fracture of sacrum, sequela: Secondary | ICD-10-CM | POA: Diagnosis not present

## 2019-08-29 DIAGNOSIS — R278 Other lack of coordination: Secondary | ICD-10-CM | POA: Diagnosis not present

## 2019-08-29 DIAGNOSIS — I428 Other cardiomyopathies: Secondary | ICD-10-CM | POA: Diagnosis not present

## 2019-08-29 DIAGNOSIS — I480 Paroxysmal atrial fibrillation: Secondary | ICD-10-CM | POA: Diagnosis not present

## 2019-08-29 DIAGNOSIS — E119 Type 2 diabetes mellitus without complications: Secondary | ICD-10-CM | POA: Diagnosis not present

## 2019-08-29 DIAGNOSIS — Z7401 Bed confinement status: Secondary | ICD-10-CM | POA: Diagnosis not present

## 2019-08-29 DIAGNOSIS — S3210XD Unspecified fracture of sacrum, subsequent encounter for fracture with routine healing: Secondary | ICD-10-CM | POA: Diagnosis not present

## 2019-08-29 DIAGNOSIS — R52 Pain, unspecified: Secondary | ICD-10-CM | POA: Diagnosis not present

## 2019-08-29 DIAGNOSIS — R2689 Other abnormalities of gait and mobility: Secondary | ICD-10-CM | POA: Diagnosis not present

## 2019-08-29 DIAGNOSIS — U071 COVID-19: Secondary | ICD-10-CM | POA: Diagnosis not present

## 2019-08-29 DIAGNOSIS — F29 Unspecified psychosis not due to a substance or known physiological condition: Secondary | ICD-10-CM | POA: Diagnosis not present

## 2019-08-29 DIAGNOSIS — R41841 Cognitive communication deficit: Secondary | ICD-10-CM | POA: Diagnosis not present

## 2019-08-29 DIAGNOSIS — I951 Orthostatic hypotension: Secondary | ICD-10-CM | POA: Diagnosis not present

## 2019-08-29 DIAGNOSIS — M6281 Muscle weakness (generalized): Secondary | ICD-10-CM | POA: Diagnosis not present

## 2019-08-29 DIAGNOSIS — I4891 Unspecified atrial fibrillation: Secondary | ICD-10-CM | POA: Diagnosis not present

## 2019-08-29 DIAGNOSIS — Z5181 Encounter for therapeutic drug level monitoring: Secondary | ICD-10-CM | POA: Diagnosis not present

## 2019-08-29 DIAGNOSIS — I959 Hypotension, unspecified: Secondary | ICD-10-CM | POA: Diagnosis not present

## 2019-08-29 DIAGNOSIS — R1312 Dysphagia, oropharyngeal phase: Secondary | ICD-10-CM | POA: Diagnosis not present

## 2019-08-29 DIAGNOSIS — M2550 Pain in unspecified joint: Secondary | ICD-10-CM | POA: Diagnosis not present

## 2019-08-29 DIAGNOSIS — E860 Dehydration: Secondary | ICD-10-CM | POA: Diagnosis not present

## 2019-08-29 DIAGNOSIS — G35 Multiple sclerosis: Secondary | ICD-10-CM | POA: Diagnosis not present

## 2019-08-29 DIAGNOSIS — Z7409 Other reduced mobility: Secondary | ICD-10-CM | POA: Diagnosis not present

## 2019-08-29 LAB — PROTIME-INR
INR: 2 — ABNORMAL HIGH (ref 0.8–1.2)
Prothrombin Time: 22.6 seconds — ABNORMAL HIGH (ref 11.4–15.2)

## 2019-08-29 LAB — CBC
HCT: 42 % (ref 36.0–46.0)
Hemoglobin: 13.7 g/dL (ref 12.0–15.0)
MCH: 31.4 pg (ref 26.0–34.0)
MCHC: 32.6 g/dL (ref 30.0–36.0)
MCV: 96.1 fL (ref 80.0–100.0)
Platelets: 187 10*3/uL (ref 150–400)
RBC: 4.37 MIL/uL (ref 3.87–5.11)
RDW: 12 % (ref 11.5–15.5)
WBC: 6.6 10*3/uL (ref 4.0–10.5)
nRBC: 0 % (ref 0.0–0.2)

## 2019-08-29 LAB — ACTH STIMULATION, 3 TIME POINTS
Cortisol, 30 Min: 21.2 ug/dL
Cortisol, 60 Min: 24.6 ug/dL
Cortisol, Base: 9.3 ug/dL

## 2019-08-29 MED ORDER — MIDODRINE HCL 5 MG PO TABS: 5.0000 mg | ORAL_TABLET | Freq: Three times a day (TID) | ORAL | Status: DC

## 2019-08-29 MED ORDER — HYDROCODONE-ACETAMINOPHEN 5-325 MG PO TABS: 1.0000 | ORAL_TABLET | Freq: Four times a day (QID) | ORAL | 0 refills | Status: DC | PRN

## 2019-08-29 MED ORDER — METOPROLOL TARTRATE 25 MG PO TABS: 12.5000 mg | ORAL_TABLET | Freq: Two times a day (BID) | ORAL | Status: DC

## 2019-08-29 MED ORDER — ZINC SULFATE 220 (50 ZN) MG PO CAPS: 220.0000 mg | ORAL_CAPSULE | Freq: Every day | ORAL | Status: DC

## 2019-08-29 MED ORDER — WARFARIN SODIUM 2.5 MG PO TABS
2.5000 mg | ORAL_TABLET | Freq: Once | ORAL | Status: AC
  Administered 2019-08-29: 2.5 mg via ORAL
  Filled 2019-08-29: qty 1

## 2019-08-29 MED ORDER — SODIUM CHLORIDE 1 G PO TABS: 1.0000 g | ORAL_TABLET | Freq: Two times a day (BID) | ORAL | Status: DC

## 2019-08-29 MED ORDER — ALPRAZOLAM 0.5 MG PO TABS: 0.5000 mg | ORAL_TABLET | Freq: Every evening | ORAL | 0 refills | Status: DC | PRN

## 2019-08-29 NOTE — NC FL2 (Signed)
[place] LEVEL OF CARE SCREENING TOOL     IDENTIFICATION  Patient Name: Toni [name] Birthdate: [birth year] Sex: female Admission Date (Current Location): 08/22/2019  [facility] [address] and [place] Number:  Herbalist and Address:  [facility],  [place], [place]      Provider Number: [id number]  Attending Physician Name and Address:  Arma Heading, MD  Relative Name and Phone Number:  Marletta Lor Daughter   [phone]    Current Level of Care: Hospital Recommended Level of Care: [place] Prior Approval Number:    Date Approved/Denied:   PASRR Number: [id number]  Discharge Plan: SNF    Current Diagnoses: Patient Active Problem List   Diagnosis Date Noted  . COVID-19 virus infection 08/23/2019  . Constipation   . Bilateral leg weakness 08/04/2019  . Weakness 08/04/2019  . Unilateral primary osteoarthritis, left knee 01/09/2019  . Type 2 diabetes mellitus without complication, without long-term current use of insulin ([place]) 08/27/2018  . Obstructive sleep apnea treated with bilevel positive airway pressure (BiPAP) 08/15/2018  . Sleep apnea treated with nocturnal BiPAP 04/23/2018  . 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 ([place]) 11/21/2017  . Permanent atrial fibrillation ([place]) 11/21/2017  . Tightness of leg fascia 11/21/2017  . Muscle spasm of both lower legs 11/21/2017  . Carpal tunnel syndrome, left upper limb 09/13/2017  . Carpal tunnel syndrome, right upper limb 09/13/2017  . Other insomnia 06/11/2017  . Depression 06/11/2017  . Abdominal swelling 03/29/2017  . Central apnea 03/26/2017  . Class 3 obesity with serious comorbidity and body mass index (BMI) of 40.0 to 44.9 in adult 03/22/2017  . Vitamin D deficiency 03/22/2017  . Prediabetes 03/22/2017  . Venous insufficiency 02/14/2017  . Right hand weakness 08/06/2016  . Nonischemic cardiomyopathy ([place]) 05/09/2016  . Localized  edema 05/09/2016  . Long term current use of anticoagulant therapy 02/03/2016  . Combined systolic and diastolic congestive heart failure ([place]) 01/19/2016  . Atrial fibrillation with RVR ([place]) 01/19/2016  . 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 Obesity hypoventilation syndrome ([place]) 03/30/2015  . Abnormality of gait 03/01/2015  . Chronic constipation with overflow incontinence 01/25/2015  . ACTH dependent Cushing's syndrome ([place]) 01/25/2015  . 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) ([place]) 01/25/2015  . 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 Closed right ankle fracture 10/30/2013  . Leucocytosis 10/30/2013  . Varicose veins of both lower extremities with complications 0000000  . Muscle spasms of lower extremity 02/24/2013  . Long term (current) use of anticoagulants 11/05/2012  . 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 Aspiration pneumonia vs Failed outpatient Rx of CAP 11/26/2011  . Essential hypertension   . Thyroid disease   . HLD (hyperlipidemia)     Orientation RESPIRATION BLADDER Height & Weight     Self, Time, Situation, Place  Normal Continent Weight: 192 lb 14.4 oz (87.5 kg) Height:  5\' 3"  (160 cm)  BEHAVIORAL SYMPTOMS/MOOD NEUROLOGICAL BOWEL NUTRITION STATUS      Incontinent Diet  AMBULATORY STATUS COMMUNICATION OF NEEDS Skin   Limited Assist Verbally Normal                       Personal Care Assistance Level of Assistance  Bathing, Feeding, Dressing Bathing Assistance: Limited assistance Feeding assistance: Independent Dressing Assistance: Limited assistance     Functional Limitations Info  Sight, Hearing, Speech Sight  Info: Adequate Hearing Info: Adequate Speech Info: Adequate    SPECIAL CARE FACTORS FREQUENCY  PT (By licensed PT), OT (By licensed OT)     PT Frequency: Minimum 5x a week OT Frequency: Minimum 5x a week             Contractures Contractures Info: Not present    Additional Factors Info  Code Status, Allergies, Psychotropic, Isolation Precautions Code Status Info: Full Code Allergies Info: Hydrocodone Oxycodone Chlorhexidine Gluconate Zoloft Psychotropic Info: buPROPion (WELLBUTRIN XL) 24 hr tablet 300 mg   Isolation Precautions Info: Covid + Air/Con precautions.     Current Medications (08/29/2019):  This is the current hospital active medication list Current Facility-Administered Medications  Medication Dose Route Frequency Provider Last Rate Last Admin  . acetaminophen (TYLENOL) tablet 650 mg  650 mg Oral Q6H PRN Antonieta Pert, MD   650 mg at 08/29/19 1208  . ALPRAZolam Duanne Moron) tablet 0.5 mg  0.5 mg Oral QHS PRN Arma Heading, MD   0.5 mg at 08/28/19 1821  . ascorbic acid (VITAMIN C) tablet 500 mg  500 mg Oral Daily Kc, Ramesh, MD   500 mg at 08/29/19 0849  . baclofen (LIORESAL) tablet 20 mg  20 mg Oral TID PRN Arma Heading, MD   20 mg at 08/29/19 1208  . bisacodyl (DULCOLAX) suppository 10 mg  10 mg Rectal Daily PRN Antonieta Pert, MD   10 mg at 08/27/19 2119  . buPROPion (WELLBUTRIN XL) 24 hr tablet 300 mg  300 mg Oral Daily Bunnie Pion Z, DO   300 mg at 08/29/19 0849  . calcium-vitamin D (OSCAL WITH D) 500-200 MG-UNIT per tablet 1 tablet  1 tablet Oral BID WC Antonieta Pert, MD   1 tablet at 08/29/19 0849  . cholecalciferol (VITAMIN D3) tablet 2,000 Units  2,000 Units Oral BID Antonieta Pert, MD   2,000 Units at 08/29/19 0848  . docusate sodium (COLACE) capsule 100 mg  100 mg Oral Daily PRN [place] Praise Stennett J, DO   100 mg at 08/27/19 0851  . famotidine (PEPCID) tablet 20 mg  20 mg Oral QHS Bunnie Pion Z, DO   20 mg at 08/28/19 2121  . fluticasone (FLONASE) 50 MCG/ACT nasal spray 2 spray  2 spray Each Nare Daily PRN Kc, Ramesh, MD      . gabapentin (NEURONTIN) capsule 300 mg  300 mg Oral TID Bunnie Pion Z, DO   300 mg at 08/29/19 0849  . HYDROcodone-acetaminophen (NORCO/VICODIN) 5-325 MG per tablet  1 tablet  1 tablet Oral Q6H PRN Antonieta Pert, MD   1 tablet at 08/28/19 0939  . lactulose (encephalopathy) (CHRONULAC) 10 GM/15ML solution 30 g  30 g Oral Daily PRN [place] Erendida Wrenn J, DO   30 g at 08/26/19 1133  . levothyroxine (SYNTHROID) tablet 75 mcg  75 mcg Oral Q0600 Bunnie Pion Z, DO   75 mcg at 08/29/19 0500  . loratadine (CLARITIN) tablet 10 mg  10 mg Oral Daily [place] Donnamarie Poag, DO   10 mg at 08/29/19 0849  . Melatonin TABS 3 mg  1 tablet Oral QHS PRN Bunnie Pion Z, DO   3 mg at 08/28/19 2120  . metoprolol tartrate (LOPRESSOR) tablet 12.5 mg  12.5 mg Oral BID [place] Delecia Vastine J, DO   12.5 mg at 08/29/19 0849  . midodrine (PROAMATINE) tablet 5 mg  5 mg Oral TID WC Arma Heading, MD   5 mg at 08/29/19 1209  . nitrofurantoin (macrocrystal-monohydrate) (MACROBID) capsule 100 mg  100 mg Oral Q12H Arma Heading, MD   100 mg at 08/29/19 1209  . ondansetron (ZOFRAN) tablet 4 mg  4 mg Oral Q6H PRN Bunnie Pion Z, DO   4 mg at 08/28/19 2120   Or  . ondansetron (ZOFRAN) injection 4 mg  4 mg Intravenous Q6H PRN Bunnie Pion Z, DO   4 mg at 08/29/19 [id number]  . pantoprazole (PROTONIX) EC tablet 40 mg  40 mg Oral Daily Kc, Ramesh, MD   40 mg at 08/29/19 0848  . polyethylene glycol (MIRALAX / GLYCOLAX) packet 17 g  17 g Oral Daily PRN Antonieta Pert, MD   17 g at 08/27/19 0848  . simvastatin (ZOCOR) tablet 10 mg  10 mg Oral QPM Kc, Ramesh, MD   10 mg at 08/28/19 1821  . sodium chloride tablet 1 g  1 g Oral BID WC Arma Heading, MD   1 g at 08/29/19 0848  . tamsulosin (FLOMAX) capsule 0.4 mg  0.4 mg Oral Daily Bunnie Pion Z, DO   0.4 mg at 08/28/19 [id number]  . warfarin (COUMADIN) tablet 2.5 mg  2.5 mg Oral ONCE-1800 Minda Ditto, RPH      . Warfarin - Pharmacist Dosing Inpatient   Does not apply q1800 Edmonia Lynch, DO      . zinc sulfate capsule 220 mg  220 mg Oral Daily Kc, Maren [place], MD   220 mg at 08/29/19 [id number]     Discharge Medications: Please see discharge summary for a list of discharge  medications.  Relevant Imaging Results:  Relevant Lab Results:   Additional Information SSN [id number]  Ross Ludwig, LCSW

## 2019-08-29 NOTE — TOC Transition Note (Addendum)
Transition of Care [facility]) - CM/SW Discharge Note   Patient Details  Name: Toni [name] MRN: [id number] Date of Birth: 1945-09-26  [facility] CM/SW Contact:  Ross Ludwig, LCSW Phone Number: 08/29/2019, 2:28 PM   Clinical Narrative:    Patient to be d/c'ed today to [facility] room 702P.  Patient and family agreeable to plans will transport via ems RN to call report to [phone].  Patient's daughter is aware that patient is discharging today.   Final next level of care: [place] Barriers to Discharge: Barriers Resolved   Patient Goals and CMS Choice Patient states their goals for this hospitalization and ongoing recovery are:: To return to [facility] to continue with therapy CMS Medicare.gov Compare Post Acute Care list provided to:: Patient Represenative (must comment) Choice offered to / list presented to : Adult Children  Discharge Placement   Existing PASRR number confirmed : 08/29/19          Patient chooses bed at: [facility] Patient to be transferred to facility by: [place] Name of family member notified: Daughter Krisin Patient and family notified of of transfer: 08/29/19  Discharge Plan and Services                                     Social Determinants of Health (SDOH) Interventions     Readmission Risk Interventions No flowsheet data found.

## 2019-08-29 NOTE — Progress Notes (Signed)
[place] for warfarin Indication: atrial fibrillation  Allergies  Allergen Reactions  . Hydrocodone Nausea And Vomiting    Hydrocodone causes vomiting  . Oxycodone Nausea And Vomiting    Oral oxycodone causes vomiting  . Chlorhexidine Gluconate Rash    Sores at site  . Zoloft [Sertraline] Hives, Swelling and Rash   Vital Signs: Temp: 97.3 F (36.3 C) (01/08 0555) Temp Source: Oral (01/08 0555) BP: 100/76 (01/08 0555) Pulse Rate: 76 (01/08 0555)  Labs: Recent Labs    08/27/19 0230 08/28/19 0244 08/29/19 0757  HGB  --   --  13.7  HCT  --   --  42.0  PLT  --   --  187  LABPROT 18.3* 21.4* 22.6*  INR 1.5* 1.9* 2.0*   Estimated Creatinine Clearance: 66.6 mL/min (A) (by C-G formula based on SCr of 0.43 mg/dL (L)).  Assessment: Patient on Warfarin 5mg  daily except 2.5mg  on Fri per last anticoag visit note PTA for atrial fibrillation. Dose from MAG states 4.5mg  daily except 4mg  on Wed/Thurs. INR therapeutic on admit at 2.4. Pharmacy consulted for Warfarin management while patient in the hospital.   Today, 08/29/2019 INR increased to 2.0 today CBC (1/8): Hgb, Pltc WNL  No major drug interactions noted No bleeding issues noted  Goal of Therapy:  INR 2-3  Plan:   Warfarin 2.5mg  PO x 1 today   Daily PT/INR  Monitor closely for s/sx of bleeding  Minda Ditto PharmD 08/29/2019, 9:32 AM

## 2019-08-29 NOTE — Consult Note (Signed)
   [facility] Inpatient Consult   08/29/2019  DEVANEE MAI August 04, 1946 [id number]   [facility] Care Management Follow Up:  Patient chart reviewed for 30 day readmission and high unplanned readmission risk score. Current disposition is for SNF. No THN CM needs.  Netta Cedars, MSN, [facility] Liaison Nurse Mobile Phone [phone]  Toll free office [phone]

## 2019-08-29 NOTE — Discharge Summary (Signed)
Physician Discharge Summary  Toni [name] [id number] DOB: 1945-11-26 DOA: 08/22/2019  PCP: Prince Solian, MD  Admit date: 08/22/2019 Discharge date: 08/29/2019  Admitted From: SNF Disposition: SNF  Recommendations for Outpatient Follow-up:  1. Follow-up with Neurology in 1-2 weeks for balcofen pump and further eval of MS, it is possible her orthostatic hypotension could be related to MS, would ask Neurology to evaluate as well as balofen pump 2. May need Til-Table Testing or referral to Cardiology 3. Continue Midodrine and salt tabs for Soft BP, is asymptomatic except when she stands up, should improve with therapy and physical conditioning 4. Continue physical conditioning 5. Continue Bipap at night as tolerated, oxygen otherwise 6. Continue warfarin for anticoagulation and monitor levels 7. Cortisol stim test and multiple other etiologies of orthostatic hypotension including volume depletion ruled out, see above for final recommendations, likely neurogenic and from extensive physical deconditioning 8. Treat constipation 9. Evaluate for recurrent UTI 10. Please obtain BMP/CBC in one week  Home Health: NO Equipment/Devices: NO  Discharge Condition: Stable  CODE STATUS: Full  Diet recommendation: Heart Healthy   Brief/Interim Summary: 76 year oldF W/ hx ofhypertension, anxiety, hypothyroidism, atrial fibrillation on Coumadin, multiple sclerosis, who presented from [facility] for concerns of hypotension. She was recently admitted and treated in the hospital status post fall and severe weakness.Patient states that she recently started having low blood pressure and was evaluated by cardiologist1 day PTAwho decreased her Bystolic from 10 mg to 2.5 mg and also held her Lasix and Cardizem but morning of admission HRwent up to 130 so she took 1 dose of Cardizem for her atrial fibrillation with increased heart rate. Patient further mentioned that during the physical therapy her  blood pressure dropped to 80s. Patient admits of having some difficulty with urination and also reported that she was tested positive for COVID-19,3 days PTA.Patient otherwise denies fever, chills, headache, sore throat, loss of taste and smell sensation, chest pain, nausea, vomiting, abdominal pain and diarrhea.  In ER, temp98.6, blood pressure 101/50 that dropped to 98/60, heart rate 68 and oxygen saturation 95% on room air. Urine was positive for UTI. In ED patient was managed with 3 L of normal saline and also given 1 g of ceftriaxone, continues on CTX since that time  1/4: Change to PO abx on macobid for 2 more doses 1/5: complaining of baclofen pump issues, will add supplemental and address as outpatient per neurologist  1/6: Continues to be orthostatic despite midodrine and increases to this medication 1/7: Started on salt tabs as further management for orthostatic hypotension, continue thigh stockings 1/8: Cortisol levels normal from stim test, BP normal  Discharge Diagnoses:  Active Problems:   Sleep apnea treated with nocturnal BiPAP   COVID-19 virus infection  Discharge Instructions  Discharge Instructions    Call MD for:  difficulty breathing, headache or visual disturbances   Complete by: As directed    Call MD for:  extreme fatigue   Complete by: As directed    Call MD for:  persistant dizziness or light-headedness   Complete by: As directed    Call MD for:  persistant nausea and vomiting   Complete by: As directed    Call MD for:  severe uncontrolled pain   Complete by: As directed    Call MD for:  temperature >100.4   Complete by: As directed    Diet - low sodium heart healthy   Complete by: As directed    Increase activity slowly   Complete  by: As directed      Allergies as of 08/29/2019      Reactions   Hydrocodone Nausea And Vomiting   Hydrocodone causes vomiting   Oxycodone Nausea And Vomiting   Oral oxycodone causes vomiting   Chlorhexidine  Gluconate Rash   Sores at site   Zoloft [sertraline] Hives, Swelling, Rash      Medication List    STOP taking these medications   Bystolic 2.5 MG tablet Generic drug: nebivolol   diltiazem 180 MG 24 hr capsule Commonly known as: CARDIZEM CD   furosemide 20 MG tablet Commonly known as: LASIX   zinc gluconate 50 MG tablet     TAKE these medications   acetaminophen 500 MG tablet Commonly known as: TYLENOL Take 500-1,000 mg by mouth every 6 (six) hours as needed for fever or headache (or pain).   Acthar 80 UNIT/ML injectable gel Generic drug: corticotropin INJECT 80 UNITS (1ML) DAILY FOR 10 DAYS. DUE TO MS EXACERBATION AND THEN CAN RESUME NEXT MONTH AT PREVIOUS DOSE.   ALPRAZolam 0.5 MG tablet Commonly known as: XANAX TAKE 1 TABLET BY MOUTH AT BEDTIME AS NEEDED FOR ANXIETY What changed:   how much to take  how to take this  when to take this  reasons to take this  additional instructions   baclofen 20 MG tablet Commonly known as: LIORESAL Once to two tablets a day for breakthrough spasms What changed:   how much to take  how to take this  when to take this  reasons to take this  additional instructions   buPROPion 300 MG 24 hr tablet Commonly known as: Wellbutrin XL Take 1 tablet (300 mg total) by mouth daily.   Caltrate 600+D Plus Minerals 600-800 MG-UNIT Tabs Take 2 tablets by mouth 2 (two) times daily.   cetirizine 10 MG tablet Commonly known as: ZYRTEC Take 10 mg by mouth daily as needed for allergies.   Cranberry 1000 MG Caps Take 2 capsules by mouth 2 (two) times daily.   docusate sodium 100 MG capsule Commonly known as: COLACE Take 1 capsule (100 mg total) by mouth daily.   famotidine 20 MG tablet Commonly known as: PEPCID Take 20 mg by mouth at bedtime.   fluticasone 50 MCG/ACT nasal spray Commonly known as: FLONASE Place 2 sprays into both nostrils daily as needed for allergies.   gabapentin 100 MG capsule Commonly known as:  NEURONTIN Take 200 mg by mouth at bedtime. Given with gabapentin 300mg  qhs for a total dose of 500 mg at bedtime What changed: Another medication with the same name was changed. Make sure you understand how and when to take each.   gabapentin 300 MG capsule Commonly known as: NEURONTIN TAKE 1 CAPSULE BY MOUTH UP  TO 4 TIMES DAILY What changed: See the new instructions.   Generlac 10 GM/15ML Soln Generic drug: lactulose (encephalopathy) TAKE 15 ML BY MOUTH TWICE DAILY AS NEEDED What changed: See the new instructions.   HYDROcodone-acetaminophen 5-325 MG tablet Commonly known as: NORCO/VICODIN Take 1 tablet by mouth every 6 (six) hours as needed for moderate pain or severe pain.   levothyroxine 75 MCG tablet Commonly known as: SYNTHROID Take 75 mcg by mouth daily.   Melatonin 3 MG Tabs Take 1 tablet (3 mg total) by mouth at bedtime as needed (sleep).   metoprolol tartrate 25 MG tablet Commonly known as: LOPRESSOR Take 0.5 tablets (12.5 mg total) by mouth 2 (two) times daily.   midodrine 5 MG tablet Commonly known  as: PROAMATINE Take 1 tablet (5 mg total) by mouth 3 (three) times daily with meals.   omeprazole 20 MG capsule Commonly known as: PRILOSEC Take 20 mg by mouth daily.   ondansetron 4 MG tablet Commonly known as: ZOFRAN Take 4 mg by mouth every 8 (eight) hours as needed for nausea or vomiting.   psyllium 28 % packet Commonly known as: METAMUCIL SMOOTH TEXTURE Take 1 packet by mouth daily.   simvastatin 10 MG tablet Commonly known as: ZOCOR Take 10 mg by mouth every evening.   sodium chloride 1 g tablet Take 1 tablet (1 g total) by mouth 2 (two) times daily with a meal.   SYRINGE/NEEDLE (DISP) 1 ML 25G X 5/8" 1 ML Misc Commonly known as: BD Luer-Lok Syringe Use as directed to inject  Acthar   tamsulosin 0.4 MG Caps capsule Commonly known as: FLOMAX Take 0.4 mg by mouth daily.   Vitamin C 500 MG Caps Take 500 mg by mouth daily.   Vitamin D 50 MCG  (2000 UT) tablet Take 1 tablet (2,000 Units total) by mouth 2 (two) times daily.   warfarin 5 MG tablet Commonly known as: COUMADIN Take as directed. If you are unsure how to take this medication, talk to your nurse or doctor. Original instructions: TAKE 1 TABLET BY MOUTH  DAILY AS DIRECTED BY  COUMADIN CLINIC What changed: See the new instructions.   zinc sulfate 220 (50 Zn) MG capsule Take 1 capsule (220 mg total) by mouth daily. Start taking on: August 30, 2019       Allergies  Allergen Reactions  . Hydrocodone Nausea And Vomiting    Hydrocodone causes vomiting  . Oxycodone Nausea And Vomiting    Oral oxycodone causes vomiting  . Chlorhexidine Gluconate Rash    Sores at site  . Zoloft [Sertraline] Hives, Swelling and Rash    Consultations:  None   Procedures/Studies: DG Sacrum/Coccyx  Result Date: 07/31/2019 CLINICAL DATA:  Buttock pain. Fall 1 week ago. Hip and coccyx pain. EXAM: SACRUM AND COCCYX - 2+ VIEW COMPARISON:  Concurrent pelvic and right hip radiographs. Reformats from abdominopelvic CT 02/25/2019. FINDINGS: Please note examination is limited due to soft tissue attenuation from habitus. Cortical irregularity at the sacrococcygeal junction on the lateral view, not well assessed due to habitus. Sacral ala are grossly maintained. The sacroiliac joints are congruent with degenerative change. IMPRESSION: Cortical irregularity at the sacrococcygeal junction on the lateral view, not well assessed due to habitus. High parents of normal in this region, however this is not definitively seen on prior exam and suspicious for nondisplaced fracture. Electronically Signed   By: Keith Rake M.D.   On: 07/31/2019 23:30   MR BRAIN WO CONTRAST  Result Date: 08/06/2019 CLINICAL DATA:  Rule out CVA. Focal neuro deficit. History of MS. EXAM: MRI HEAD WITHOUT CONTRAST TECHNIQUE: Multiplanar, multiecho pulse sequences of the brain and surrounding structures were obtained without  intravenous contrast. COMPARISON:  CT head 08/06/2016, MRI 08/08/2016 FINDINGS: Brain: Ventricle size normal. Negative for acute infarct. Periventricular white matter hyperintensity similar to 2017. Pattern is consistent with multiple sclerosis. Negative for hemorrhage or mass. Fat is seen floating in the frontal horns of the ventricles bilaterally and also in the posterior fossa, unchanged from the prior study. Vascular: Normal arterial flow voids Skull and upper cervical spine: Negative Sinuses/Orbits: Paranasal sinuses clear. Bilateral cataract surgery Other: None IMPRESSION: No acute abnormality and no change from the prior study. 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 Electronically Signed   By: Franchot Gallo M.D.   On: 08/06/2019 17:29   MR CERVICAL SPINE W WO CONTRAST  Result Date: 08/04/2019 CLINICAL DATA:  Increasing right leg weakness and numbness, right buttock pain; history of multiple sclerosis EXAM: MRI CERVICAL SPINE WITHOUT AND WITH CONTRAST TECHNIQUE: Multiplanar and multiecho pulse sequences of the cervical spine, to include the craniocervical junction and cervicothoracic junction, were obtained without and with intravenous contrast. CONTRAST:  67mL GADAVIST GADOBUTROL 1 MMOL/ML IV SOLN COMPARISON:  2011 FINDINGS: Alignment: Stable. Vertebrae: Stable vertebral body heights. There is anterior fusion from C3-C7 with plate and screw fixation and well incorporated interbody grafts. The hardware is not well evaluated on this study and there is associated susceptibility artifact. No significant marrow edema. Cord: Possible unchanged cord T2 hyperintensity on the right at C6-C7. No definite new abnormal signal with suboptimal evaluation due to artifact. No abnormal enhancement. Posterior Fossa, vertebral arteries, paraspinal tissues: Unremarkable.  Disc levels: Suboptimal evaluation due to artifact. C2-C3: Small central disc protrusion. Facet hypertrophy, left greater than right. Probable minor canal stenosis and no significant foraminal stenosis. C3-C4: Operative level. Bridging bone and endplate osteophytes. Probable mild canal stenosis and no significant foraminal stenosis. C4-C5: Operative level. Bridging bone and endplate osteophytes. Uncovertebral and facet hypertrophy. Probable mild canal stenosis and at least mild foraminal stenosis. C5-C6: Operative level. Bridging bone and endplate osteophytes. Facet and uncovertebral hypertrophy. Probable minor canal and foraminal stenosis. C6-C7: Operative level. Bridging bone and endplate osteophytes. No significant canal or foraminal stenosis. C7-T1: Small disc extrusion extending slightly below the disc level. Facet hypertrophy. No significant canal or foraminal stenosis. IMPRESSION: No definite new cord lesion or abnormal enhancement with suboptimal evaluation due to artifact. Postoperative and degenerative changes as detailed above. Electronically Signed   By: Macy Mis M.D.   On: 08/04/2019 09:07   MR THORACIC SPINE W WO CONTRAST  Result Date: 08/04/2019 CLINICAL DATA:  Fall 3 weeks ago, increasing right leg weakness and numbness; history of multiple sclerosis EXAM: MRI THORACIC WITHOUT AND WITH CONTRAST TECHNIQUE: Multiplanar and multiecho pulse sequences of the thoracic spine were obtained without and with intravenous contrast. CONTRAST:  31mL GADAVIST GADOBUTROL 1 MMOL/ML IV SOLN COMPARISON:  None. FINDINGS: MRI THORACIC SPINE FINDINGS Alignment:  Anteroposterior alignment is maintained. Vertebrae: Vertebral body heights are preserved. There are several vertebral body hemangiomas with the largest involving the left T6 pedicle and the T10 vertebral body. Cord: Suboptimal evaluation due to motion artifact. Patchy foci of cord T2 hyperintensity likely reflecting chronic demyelination given history.  Thoracic cord appears mildly atrophic. No abnormal enhancement. Paraspinal and other soft tissues: Unremarkable. Disc levels: Mild multilevel degenerative disc disease and facet arthropathy with ligamentum flavum thickening. For example, small central disc protrusion at T4-T5 and right paracentral disc extrusion extending slightly above disc level at T12-L1. There is no significant degenerative canal or neural foraminal stenosis. IMPRESSION: Patchy foci of cord T2 hyperintensity likely reflecting chronic demyelination given history. There is no definite abnormal enhancement. Mild degenerative changes. Electronically Signed   By: Macy Mis M.D.   On: 08/04/2019 09:48   MR Lumbar Spine W Wo Contrast  Result Date: 08/04/2019 CLINICAL DATA:  Fall 3 weeks ago, increasing right leg weakness and numbness; history of multiple sclerosis EXAM: MRI LUMBAR SPINE WITHOUT AND WITH CONTRAST TECHNIQUE: Multiplanar and multiecho pulse sequences of the lumbar spine were obtained without and  with intravenous contrast. CONTRAST:  15mL GADAVIST GADOBUTROL 1 MMOL/ML IV SOLN COMPARISON:  Dec 28, 2016 FINDINGS: Segmentation: For the purposes of this dictation, the most caudal lumbar type vertebral body is designated L5. Alignment: There is grade 1 anterolisthesis at L4-L5 similar to the prior study. Vertebrae: Lumbar vertebral body heights are maintained apart from degenerative endplate irregularity and Schmorl's nodes. There is sacral marrow edema particularly at S2 and S3 as well as within the imaged iliac bones bilaterally. Conus medullaris and cauda equina: Conus extends to the L1 level. Conus and cauda equina appear normal. Paraspinal and other soft tissues: Presacral fluid is noted. There is mild soft tissue edema surrounding the sacrum. Disc levels: L1-L2: Very small left central disc protrusion. No significant canal or foraminal stenosis. Appearance is similar. L2-L3: Disc bulge with small left central disc protrusion.  Mild facet arthropathy with ligamentum flavum infolding. No significant canal stenosis. Mild right and minor left foraminal stenosis. Appearance is similar. L3-L4: Disc bulge and marked facet arthropathy with ligamentum flavum infolding. Similar mild to moderate canal stenosis with partial effacement of the lateral recesses. Slightly increased mild right foraminal stenosis. Similar mild left foraminal stenosis. L4-L5: Anterolisthesis with uncovering of disc bulge. Marked facet arthropathy with ligamentum flavum infolding. Similar moderate canal stenosis with narrowing of the lateral recesses. Similar moderate right and moderate to marked left foraminal stenosis. L5-S1: Disc bulge and moderate facet arthropathy. No significant canal stenosis. Similar mild, right greater than left foraminal stenosis. IMPRESSION: Partially imaged sacral and bilateral iliac marrow edema. This is probably post-traumatic, noting possible fracture on prior imaging. Multilevel degenerative changes as detailed above. Appearance is similar to the 2018 study. Slightly increased right foraminal stenosis at L3-L4. Electronically Signed   By: Macy Mis M.D.   On: 08/04/2019 09:41   CT ABDOMEN PELVIS W CONTRAST  Result Date: 08/04/2019 CLINICAL DATA:  Fall 3 weeks ago. Persistent right buttock pain. Pain radiates to both hips. Evaluate for intra-abdominal/retroperitoneal hematoma. On anticoagulation. EXAM: CT ABDOMEN AND PELVIS WITH CONTRAST TECHNIQUE: Multidetector CT imaging of the abdomen and pelvis was performed using the standard protocol following bolus administration of intravenous contrast. CONTRAST:  166mL OMNIPAQUE IOHEXOL 300 MG/ML  SOLN COMPARISON:  Abdominopelvic CT 02/25/2019 05/11/2014 FINDINGS: Lower chest: No focal airspace disease. Trace left pleural thickening without frank effusion. Borderline cardiomegaly. No basilar rib fracture. Previous right middle lobe pulmonary nodules not included in the field of view.  Hepatobiliary: No focal hepatic abnormality or hepatic injury. No perihepatic hematoma. Gallbladder physiologically distended, no calcified stone. No biliary dilatation. Pancreas: No ductal dilatation or inflammation. Spleen: Normal in size without focal abnormality. No evidence of splenic injury. Adrenals/Urinary Tract: Normal adrenal glands. No hydronephrosis. The enhancing left renal lesion in the upper pole is less well visualized on the current exam, however appears grossly unchanged in size measuring approximately 16 mm, series 5, image 134. Additional simple cyst in the lower left kidney is again seen. No hydronephrosis. No evidence of renal injury. Symmetric excretion on delayed phase imaging. Urinary bladder is distended without wall thickening. Stomach/Bowel: Ingested material in the stomach. No bowel wall thickening or evidence of injury. No mesenteric hematoma. There is fecalization of distal small bowel contents. Normal appendix. Large volume of stool throughout the colon with colonic tortuosity. No mesenteric hematoma. Vascular/Lymphatic: Mild aortic atherosclerosis. No evidence of aortic injury. IVC is intact. No retroperitoneal fluid. No retroperitoneal stranding or evidence of retroperitoneal bleed. No adenopathy. Reproductive: Uterus is unremarkable. No suspicious adnexal mass. Slight right ovarian  prominence is unchanged from prior. Other: No free fluid or evidence intra-abdominal bleed. No retroperitoneal hemorrhage. No free air. Minimal patchy subcutaneous edema of the right posterior flank without confluent hematoma. Musculoskeletal: Spinal stimulator in place with battery pack in the right anterior abdominal wall. Minimal cortical buckling of the mid sacrum may represent a nondisplaced fracture, series 5, image 100. No other fracture of the lumbar spine or pelvis. Multilevel degenerative change throughout the lumbar spine. IMPRESSION: 1. Minimal cortical buckling of the mid sacrum may  represent a nondisplaced fracture. 2. Minimal patchy subcutaneous edema of the right posterior flank without confluent hematoma. No evidence of intra-abdominal or retroperitoneal bleed. No evidence of acute intra-abdominal/pelvic injury. 3. Enhancing left renal lesion measuring 16 mm, unchanged from abdominal CT 02/25/2019. 4. Large volume of stool throughout the colon with colonic tortuosity in fecalization of distal small bowel contents consistent with constipation and slow bowel transit. Aortic Atherosclerosis (ICD10-I70.0). Electronically Signed   By: Keith Rake M.D.   On: 08/04/2019 04:06   DG Chest Port 1 View  Result Date: 08/22/2019 CLINICAL DATA:  Shortness of breath EXAM: PORTABLE CHEST 1 VIEW COMPARISON:  07/08/2017 FINDINGS: Stable cardiomegaly. Pulmonary vasculature is within normal limits. No focal airspace consolidation, pleural effusion, or pneumothorax. IMPRESSION: Stable cardiomegaly without edema or focal airspace disease. Electronically Signed   By: Davina Poke D.O.   On: 08/22/2019 14:09   DG Hip Unilat W or Wo Pelvis 2-3 Views Right  Result Date: 07/31/2019 CLINICAL DATA:  Buttock pain. Fall 1 week ago. Hip and coccyx pain. EXAM: DG HIP (WITH OR WITHOUT PELVIS) 2-3V RIGHT COMPARISON:  Concurrent sacrum and coccyx radiographs FINDINGS: The cortical margins of the bony pelvis and right hip are intact. Moderate bilateral hip osteoarthritis. The questioned sacrococcygeal fracture is not well assessed on the current exam. Pubic symphysis and sacroiliac joints are congruent. Both femoral heads are well-seated in the respective acetabula. Stimulator with battery pack in the right. IMPRESSION: No evidence of pelvic or right hip fracture. The questioned of sacrococcygeal fracture dedicated coccyx exam is not well assessed on the current exam. Electronically Signed   By: Keith Rake M.D.   On: 07/31/2019 23:32    Subjective: No acute events overnight. Is worried about going to  SNF, but encouraged about completeness of workup here. BP has looked better on salt tabs and midodrine. Cortisol stim test was normal. Overall patient has completed workup, is doing symptomatically better. Thinks baclofen pump is fine now. Otherwise no complaints at this time.   Discharge Exam: Vitals:   08/28/19 2236 08/29/19 0555  BP: 105/72 100/76  Pulse: 71 76  Resp: 18 18  Temp: 97.7 F (36.5 C) (!) 97.3 F (36.3 C)  SpO2: 97% 98%   Vitals:   08/28/19 0512 08/28/19 1352 08/28/19 2236 08/29/19 0555  BP: (!) 101/57 114/72 105/72 100/76  Pulse: 67 (!) 41 71 76  Resp: 18 20 18 18   Temp: 98.1 F (36.7 C)  97.7 F (36.5 C) (!) 97.3 F (36.3 C)  TempSrc: Oral   Oral  SpO2: 98% 100% 97% 98%  Weight:      Height:        General exam: Appears calm and comfortable, exam unchanged from 08/27/18 Respiratory system: Clear to auscultation. Respiratory effort normal. Cardiovascular system: S1 & S2 heard, RRR. No pedal edema. Gastrointestinal system: Abdomen is nondistended, soft but slightly tender. No organomegaly or masses felt. Normal bowel sounds heard. Central nervous system: Alert and oriented. No focal neurological  deficits. Extremities: Symmetric 5 x 5 power. Age appropriate, but does have signs of deconditioning Skin: No rashes, lesions or ulcers Psychiatry: Judgement and insight appear normal. Mood & affect appropriate.   The results of significant diagnostics from this hospitalization (including imaging, microbiology, ancillary and laboratory) are listed below for reference.     Microbiology: Recent Results (from the past 240 hour(s))  Blood culture (routine x 2)     Status: None   Collection Time: 08/22/19  6:01 PM   Specimen: BLOOD  Result Value Ref Range Status   Specimen Description   Final    BLOOD LEFT ANTECUBITAL Performed at [facility] [address], [place], [place] [address]    Special Requests   Final    BOTTLES DRAWN AEROBIC AND  ANAEROBIC Blood Culture results may not be optimal due to an excessive volume of blood received in culture bottles Performed at [facility] [address], [place], [place] [address]    Culture   Final    NO GROWTH 5 DAYS Performed at [facility], [place] [address] [place], [place] [address]    Report Status 08/27/2019 FINAL  Final  Blood culture (routine x 2)     Status: None   Collection Time: 08/22/19  6:06 PM   Specimen: BLOOD  Result Value Ref Range Status   Specimen Description   Final    BLOOD BLOOD RIGHT FOREARM Performed at [facility] [address], [place], [place] [address]    Special Requests   Final    BOTTLES DRAWN AEROBIC AND ANAEROBIC Blood Culture adequate volume Performed at [facility] [address], [place], [place] [address]    Culture   Final    NO GROWTH 5 DAYS Performed at [facility], [place] [address] [place], [place] [address]    Report Status 08/27/2019 FINAL  Final  Urine culture     Status: Abnormal   Collection Time: 08/22/19  7:58 PM   Specimen: Urine, Random  Result Value Ref Range Status   Specimen Description   Final    URINE, RANDOM Performed at [facility] [address], [place], [place] [address]    Special Requests   Final    NONE Performed at [facility], [place] [address] [place], [place] [address]    Culture (A)  Final    >=100,000 COLONIES/mL STAPHYLOCOCCUS EPIDERMIDIS 20,000 COLONIES/mL ESCHERICHIA COLI    Report Status 08/25/2019 FINAL  Final   Organism ID, Bacteria STAPHYLOCOCCUS EPIDERMIDIS (A)  Final   Organism ID, Bacteria ESCHERICHIA COLI (A)  Final      Susceptibility   Escherichia coli - MIC*    AMPICILLIN 8 SENSITIVE Sensitive     CEFAZOLIN <=4 SENSITIVE Sensitive     CEFTRIAXONE <=0.25 SENSITIVE Sensitive     CIPROFLOXACIN <=0.25 SENSITIVE Sensitive     GENTAMICIN <=1 SENSITIVE Sensitive      IMIPENEM <=0.25 SENSITIVE Sensitive     NITROFURANTOIN <=16 SENSITIVE Sensitive     TRIMETH/SULFA <=20 SENSITIVE Sensitive     AMPICILLIN/SULBACTAM 4 SENSITIVE Sensitive     PIP/TAZO <=4 SENSITIVE Sensitive     * 20,000 COLONIES/mL ESCHERICHIA COLI   Staphylococcus epidermidis - MIC*    CIPROFLOXACIN >=8 RESISTANT Resistant     GENTAMICIN <=0.5 SENSITIVE Sensitive     NITROFURANTOIN <=16 SENSITIVE Sensitive     OXACILLIN >=4 RESISTANT Resistant     TETRACYCLINE >=16 RESISTANT  Resistant     VANCOMYCIN 2 SENSITIVE Sensitive     TRIMETH/SULFA 40 SENSITIVE Sensitive     CLINDAMYCIN <=0.25 SENSITIVE Sensitive     RIFAMPIN <=0.5 SENSITIVE Sensitive     Inducible Clindamycin NEGATIVE Sensitive     * >=100,000 COLONIES/mL STAPHYLOCOCCUS EPIDERMIDIS     Labs: BNP (last 3 results) Recent Labs    08/22/19 1404  BNP [id number]   Basic Metabolic Panel: Recent Labs  Lab 08/22/19 1404 08/23/19 0203 08/24/19 0351 08/25/19 0424 08/26/19 0247  NA 137 140 139 142 137  K 3.7 3.1* 4.2 4.4 4.0  CL 98 105 104 106 99  CO2 29 27 29 30 30   GLUCOSE 81 136* 81 82 79  BUN 22 20 14 13 19   CREATININE 0.51 0.48 0.46 0.46 0.43*  CALCIUM 8.6* 8.1* 8.6* 8.7* 8.7*  MG  --   --  2.1 2.2 2.0   Liver Function Tests: Recent Labs  Lab 08/22/19 1404 08/23/19 0203  AST 32 34  ALT 36 36  ALKPHOS 143* 125  BILITOT 0.4 0.3  PROT 6.0* 5.1*  ALBUMIN 3.1* 2.8*   No results for input(s): LIPASE, AMYLASE in the last 168 hours. No results for input(s): AMMONIA in the last 168 hours. CBC: Recent Labs  Lab 08/22/19 1404 08/23/19 0203 08/24/19 0351 08/25/19 0424 08/26/19 0247 08/29/19 0757  WBC 5.4 5.4 5.2 5.6 5.7 6.6  NEUTROABS 2.7  --   --   --   --   --   HGB 14.2 13.7 14.0 13.9 13.7 13.7  HCT 43.0 42.5 42.9 44.1 42.4 42.0  MCV 93.5 96.6 95.8 96.5 94.6 96.1  PLT 203 165 198 183 207 187   Cardiac Enzymes: No results for input(s): CKTOTAL, CKMB, CKMBINDEX, TROPONINI in the last 168  hours. BNP: Invalid input(s): POCBNP CBG: No results for input(s): GLUCAP in the last 168 hours. D-Dimer No results for input(s): DDIMER in the last 72 hours. Hgb A1c No results for input(s): HGBA1C in the last 72 hours. Lipid Profile No results for input(s): CHOL, HDL, LDLCALC, TRIG, CHOLHDL, LDLDIRECT in the last 72 hours. Thyroid function studies No results for input(s): TSH, T4TOTAL, T3FREE, THYROIDAB in the last 72 hours.  Invalid input(s): FREET3 Anemia work up Recent Labs    08/27/19 0230  FERRITIN 159   Urinalysis    Component Value Date/Time   COLORURINE YELLOW 08/22/2019 1958   APPEARANCEUR HAZY (A) 08/22/2019 1958   APPEARANCEUR Clear 12/18/2014 1156   LABSPEC 1.009 08/22/2019 1958   PHURINE 7.0 08/22/2019 1958   GLUCOSEU NEGATIVE 08/22/2019 1958   HGBUR SMALL (A) 08/22/2019 1958   BILIRUBINUR NEGATIVE 08/22/2019 1958   BILIRUBINUR Negative 12/18/2014 1156   [place] 08/22/2019 1958   PROTEINUR NEGATIVE 08/22/2019 1958   UROBILINOGEN 0.2 08/05/2014 0951   NITRITE POSITIVE (A) 08/22/2019 1958   LEUKOCYTESUR LARGE (A) 08/22/2019 1958   Sepsis Labs Invalid input(s): PROCALCITONIN,  WBC,  LACTICIDVEN Microbiology Recent Results (from the past 240 hour(s))  Blood culture (routine x 2)     Status: None   Collection Time: 08/22/19  6:01 PM   Specimen: BLOOD  Result Value Ref Range Status   Specimen Description   Final    BLOOD LEFT ANTECUBITAL Performed at [facility], [place] [address], [place], [place] [address]    Special Requests   Final    BOTTLES DRAWN AEROBIC AND ANAEROBIC Blood Culture results may not be optimal due to an excessive volume of blood received in  culture bottles Performed at [facility] [address], [place], [place] [address]    Culture   Final    NO GROWTH 5 DAYS Performed at [facility], [place] [address], [place], [place] [address]    Report Status 08/27/2019 FINAL  Final   Blood culture (routine x 2)     Status: None   Collection Time: 08/22/19  6:06 PM   Specimen: BLOOD  Result Value Ref Range Status   Specimen Description   Final    BLOOD BLOOD RIGHT FOREARM Performed at [facility] [address], [place], [place] [address]    Special Requests   Final    BOTTLES DRAWN AEROBIC AND ANAEROBIC Blood Culture adequate volume Performed at [facility] [address] [place], [place] [address]    Culture   Final    NO GROWTH 5 DAYS Performed at [facility], [place] [address], [place], [place] [address]    Report Status 08/27/2019 FINAL  Final  Urine culture     Status: Abnormal   Collection Time: 08/22/19  7:58 PM   Specimen: Urine, Random  Result Value Ref Range Status   Specimen Description   Final    URINE, RANDOM Performed at [facility] [address], [place], [place] [address]    Special Requests   Final    NONE Performed at [facility], [place] [address] [place], [place] [address]    Culture (A)  Final    >=100,000 COLONIES/mL STAPHYLOCOCCUS EPIDERMIDIS 20,000 COLONIES/mL ESCHERICHIA COLI    Report Status 08/25/2019 FINAL  Final   Organism ID, Bacteria STAPHYLOCOCCUS EPIDERMIDIS (A)  Final   Organism ID, Bacteria ESCHERICHIA COLI (A)  Final      Susceptibility   Escherichia coli - MIC*    AMPICILLIN 8 SENSITIVE Sensitive     CEFAZOLIN <=4 SENSITIVE Sensitive     CEFTRIAXONE <=0.25 SENSITIVE Sensitive     CIPROFLOXACIN <=0.25 SENSITIVE Sensitive     GENTAMICIN <=1 SENSITIVE Sensitive     IMIPENEM <=0.25 SENSITIVE Sensitive     NITROFURANTOIN <=16 SENSITIVE Sensitive     TRIMETH/SULFA <=20 SENSITIVE Sensitive     AMPICILLIN/SULBACTAM 4 SENSITIVE Sensitive     PIP/TAZO <=4 SENSITIVE Sensitive     * 20,000 COLONIES/mL ESCHERICHIA COLI   Staphylococcus epidermidis - MIC*    CIPROFLOXACIN >=8 RESISTANT Resistant     GENTAMICIN <=0.5 SENSITIVE Sensitive      NITROFURANTOIN <=16 SENSITIVE Sensitive     OXACILLIN >=4 RESISTANT Resistant     TETRACYCLINE >=16 RESISTANT Resistant     VANCOMYCIN 2 SENSITIVE Sensitive     TRIMETH/SULFA 40 SENSITIVE Sensitive     CLINDAMYCIN <=0.25 SENSITIVE Sensitive     RIFAMPIN <=0.5 SENSITIVE Sensitive     Inducible Clindamycin NEGATIVE Sensitive     * >=100,000 COLONIES/mL STAPHYLOCOCCUS EPIDERMIDIS    Time coordinating discharge: Over 30 minutes  SIGNED:  Arma Heading, MD  [facility] 08/29/2019, 2:03 PM Pager   If 7PM-7AM, please contact night-coverage [email or web address] Password [email or web address]

## 2019-08-29 NOTE — Progress Notes (Signed)
RN attempted handoff call to [facility].  Call was answered and transferred, but phone rang multiple times with no answer; RN was unable to provide handoff report.  RN will attempt calling later.

## 2019-08-29 NOTE — Progress Notes (Signed)
RN attempted another handoff report to [facility] but received no answer.

## 2019-08-29 NOTE — Care Management Important Message (Signed)
Important Message  Patient Details IM Letter given to Evette Cristal SW Case Manager to present to the Patient                                              Name: Toni RAMELB MRN: [id number] Date of Birth: 11/26/1945   Medicare Important Message Given:  Yes     Kerin Salen 08/29/2019, 11:41 AM

## 2019-08-29 NOTE — Plan of Care (Signed)
  Problem: Education: Goal: Knowledge of General Education information will improve Description: Including pain rating scale, medication(s)/side effects and non-pharmacologic comfort measures Outcome: Adequate for Discharge   Problem: Activity: Goal: Risk for activity intolerance will decrease Outcome: Adequate for Discharge   Problem: Nutrition: Goal: Adequate nutrition will be maintained Outcome: Adequate for Discharge   Problem: Elimination: Goal: Will not experience complications related to bowel motility Outcome: Adequate for Discharge Goal: Will not experience complications related to urinary retention Outcome: Adequate for Discharge   Problem: Pain Managment: Goal: General experience of comfort will improve Outcome: Adequate for Discharge   Problem: Safety: Goal: Ability to remain free from injury will improve Outcome: Adequate for Discharge   Problem: Skin Integrity: Goal: Risk for impaired skin integrity will decrease Outcome: Adequate for Discharge   

## 2019-08-31 ENCOUNTER — Telehealth: Payer: Self-pay | Admitting: Physician Assistant

## 2019-08-31 NOTE — Telephone Encounter (Signed)
Patient called from [facility] stating she is having problems since her hospital discharge on 08/29/19. She was taken off her bystolic, diltiazem, and lasix. She was started on salt tablets. Her pressure is  Now 138/80. She has swelling in her legs up to her waist, back, hands/arms, and face.   I stopped her salt tablets and restarted 20 mg lasix daily - verbal orders given to [facility]. She needs a virtual visit ASAP (Monday 09/01/19) for medication management.

## 2019-09-01 ENCOUNTER — Telehealth (INDEPENDENT_AMBULATORY_CARE_PROVIDER_SITE_OTHER): Payer: Medicare Other | Admitting: Internal Medicine

## 2019-09-01 ENCOUNTER — Encounter: Payer: Self-pay | Admitting: Internal Medicine

## 2019-09-01 VITALS — BP 103/59 | HR 77 | Ht 62.0 in | Wt 180.0 lb

## 2019-09-01 DIAGNOSIS — I4821 Permanent atrial fibrillation: Secondary | ICD-10-CM

## 2019-09-01 DIAGNOSIS — I4891 Unspecified atrial fibrillation: Secondary | ICD-10-CM | POA: Diagnosis not present

## 2019-09-01 DIAGNOSIS — G35 Multiple sclerosis: Secondary | ICD-10-CM | POA: Diagnosis not present

## 2019-09-01 DIAGNOSIS — I951 Orthostatic hypotension: Secondary | ICD-10-CM

## 2019-09-01 DIAGNOSIS — I5032 Chronic diastolic (congestive) heart failure: Secondary | ICD-10-CM

## 2019-09-01 NOTE — Telephone Encounter (Signed)
Pt has already been set up for virtual appt at 2:30 pm with Dr. Debara Pickett

## 2019-09-01 NOTE — Patient Instructions (Signed)
Medication Instructions: NO CHANGES *If you need a refill on your cardiac medications before your next appointment, please call your pharmacy*  Lab Work: If you have labs (blood work) drawn today and your tests are completely normal, you will receive your results only by: Marland Kitchen MyChart Message (if you have MyChart) OR . A paper copy in the mail If you have any lab test that is abnormal or we need to change your treatment, we will call you to review the results.    Follow-Up: At [facility], you and your health needs are our priority.  As part of our continuing mission to provide you with exceptional heart care, we have created designated Provider Care Teams.  These Care Teams include your primary Cardiologist (physician) and Advanced Practice Providers (APPs -  Physician Assistants and Nurse Practitioners) who all work together to provide you with the care you need, when you need it.  Your next appointment:   6 month(s)  The format for your next appointment:   Either In Person or Virtual  Provider:   You may see Pixie Casino, MD or one of the following Advanced Practice Providers on your designated Care Team:    Almyra Deforest, PA-C  Fabian Sharp, PA-C or   Roby Lofts, [place]

## 2019-09-01 NOTE — Progress Notes (Signed)
Virtual Visit via Telephone Note   This visit type was conducted due to national recommendations for restrictions regarding the COVID-19 Pandemic (e.g. social distancing) in an effort to limit this patient's exposure and mitigate transmission in our community.  Due to her co-morbid illnesses, this patient is at least at moderate risk for complications without adequate follow up.  This format is felt to be most appropriate for this patient at this time.  The patient did not have access to video technology/had technical difficulties with video requiring transitioning to audio format only (telephone).  All issues noted in this document were discussed and addressed.  No physical exam could be performed with this format.  Please refer to the patient's chart for her  consent to telehealth for [facility].   Evaluation Performed:  Telephone visit  Date:  09/01/2019   ID:  Toni [name], DOB Sep 16, 1945, MRN [id number]  Patient Location:  [place] [address]  Provider location:   [address], [place], [place] [address]  PCP:  Prince Solian, MD  Cardiologist:  Pixie Casino, MD Electrophysiologist:  None   Chief Complaint:  Follow-up  History of Present Illness:    Toni [name] is a 73 y.o. female who presents via audio/video conferencing for a telehealth visit today.  Toni [name] is a 74 year old female with a history of longstanding persistent and not permanent A. fib and multiple sclerosis.  Recently she was hospitalized has had issues with orthostatic hypotension.  Medicines were discontinued and she was started on midodrine.  She was tested recently for adrenal insufficiency with a cosyntropin stim test which was negative.  She reports some mild improvement in blood pressures.  Her diuretics were held and she was placed on salt load and this resulted in a diastolic heart failure exacerbation.  Yesterday she was instructed to stop her salt and restarted  on diuretics and is now diuresing.  The patient does not have symptoms concerning for COVID-19 infection (fever, chills, cough, or new SHORTNESS OF BREATH).    Prior CV studies:   The following studies were reviewed today:  Chart reviewed  PMHx:  Past Medical History:  Diagnosis Date  . Abnormality of gait 03/01/2015  . Anxiety   . Arthritis    "knees" (01/19/2016)  . Atrial fibrillation with RVR ([place]) 01/19/2016  . Back pain   . Chest pain   . Childhood asthma   . Chronic pain    "nerve pain from the MS"  . Constipation   . Depression   . DVT (deep venous thrombosis) ([place]) 1983   "RLE; may have just been phlebitis; it was before the age of dopplers"  . 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 GERD (gastroesophageal reflux disease)   . History of stress test 06/21/10   limited exam with some degree of breast attenuatin of the apex however a component of apical ischemia is not excluded, EF 62%; cardiac cath   . HLD (hyperlipidemia)   . Hx of echocardiogram 04/22/10   EF 55-60%, no valve issues  . Hypertension   . Hypothyroidism   . Joint pain   . Migraine    "none since I stopped beta blocker in 1990s" (01/19/2016)  . Multiple sclerosis ([place])    has had this may 1989-[place] reg doc  . OSA on CPAP   . Osteoarthritis   . PAF (paroxysmal atrial  fibrillation) ([place])    on coumadin; documented on monitor 06/2010  . Pneumonia 11/2011  . Shortness of breath   . Thyroid disease     Past Surgical History:  Procedure Laterality Date  . ANTERIOR CERVICAL DECOMP/DISCECTOMY FUSION  01/2004  . BACK SURGERY    . CARDIAC CATHETERIZATION  06/22/2010   normal coronary arteries, PAF  . CATARACT EXTRACTION, BILATERAL Bilateral   . CHILD BIRTHS     X2  . DILATION AND CURETTAGE OF UTERUS    . INFUSION PUMP IMPLANTATION  ~ 2009   baclofen infusion in lower abd  . PAIN PUMP REVISION N/A 07/29/2014   Procedure: Baclofen pump replacement;   Surgeon: Erline Levine, MD;  Location: [facility];  Service: Neurosurgery;  Laterality: N/A;  Baclofen pump replacement  . TONSILLECTOMY AND ADENOIDECTOMY    . VARICOSE VEIN SURGERY  ~ 1968  . VENOUS ABLATION  12/16/2010   radiofreq ablation -Dr Elisabeth Cara and [facility]  . WISDOM TOOTH EXTRACTION      FAMHx:  Family History  Problem Relation Age of Onset  . Coronary artery disease Father        at age 19  . Hyperlipidemia Father   . Thyroid disease Father   . Coronary artery disease Maternal Grandmother   . Depression Maternal Grandmother   . Cancer Paternal Grandmother   . Depression Mother   . Sudden death Mother   . Anxiety disorder Mother   . Alcoholism Son     SOCHx:   reports that she quit smoking about 43 years ago. Her smoking use included cigarettes. She has a 3.50 pack-year smoking history. She has never used smokeless tobacco. She reports current alcohol use of about 1.0 standard drinks of alcohol per week. She reports that she does not use drugs.  ALLERGIES:  Allergies  Allergen Reactions  . Hydrocodone Nausea And Vomiting    Hydrocodone causes vomiting  . Oxycodone Nausea And Vomiting    Oral oxycodone causes vomiting  . Chlorhexidine Gluconate Rash    Sores at site  . Zoloft [Sertraline] Hives, Swelling and Rash    MEDS:  Current Meds  Medication Sig  . acetaminophen (TYLENOL) 500 MG tablet Take 500-1,000 mg by mouth every 6 (six) hours as needed for fever or headache (or pain).  Marland Kitchen ALPRAZolam (XANAX) 0.5 MG tablet Take 1 tablet (0.5 mg total) by mouth at bedtime as needed for anxiety.  . Ascorbic Acid (VITAMIN C) 500 MG CAPS Take 500 mg by mouth daily.  . baclofen (LIORESAL) 20 MG tablet Once to two tablets a day for breakthrough spasms (Patient taking differently: Take 20 mg by mouth 2 (two) times daily as needed for muscle spasms. )  . buPROPion (WELLBUTRIN XL) 300 MG 24 hr tablet Take 1 tablet (300 mg total) by mouth daily.  . Calcium Carbonate-Vit D-Min  (CALTRATE 600+D PLUS MINERALS) 600-800 MG-UNIT TABS Take 2 tablets by mouth 2 (two) times daily.  . cetirizine (ZYRTEC) 10 MG tablet Take 10 mg by mouth daily as needed for allergies.   . Cholecalciferol (VITAMIN D) 2000 units tablet Take 1 tablet (2,000 Units total) by mouth 2 (two) times daily.  . corticotropin (ACTHAR) 80 UNIT/ML injectable gel INJECT 80 UNITS (1ML) DAILY FOR 10 DAYS. DUE TO MS EXACERBATION AND THEN CAN RESUME NEXT MONTH AT PREVIOUS DOSE.  Marland Kitchen Cranberry 1000 MG CAPS Take 2 capsules by mouth 2 (two) times daily.   Marland Kitchen docusate sodium (COLACE) 100 MG capsule Take 1 capsule (100  mg total) by mouth daily.  . famotidine (PEPCID) 20 MG tablet Take 20 mg by mouth at bedtime.   . fluticasone (FLONASE) 50 MCG/ACT nasal spray Place 2 sprays into both nostrils daily as needed for allergies.   Marland Kitchen gabapentin (NEURONTIN) 100 MG capsule Take 200 mg by mouth at bedtime. Given with gabapentin 300mg  qhs for a total dose of 500 mg at bedtime  . gabapentin (NEURONTIN) 300 MG capsule TAKE 1 CAPSULE BY MOUTH UP  TO 4 TIMES DAILY (Patient taking differently: Take 300 mg by mouth 3 (three) times daily. )  . GENERLAC 10 GM/15ML SOLN TAKE 15 ML BY MOUTH TWICE DAILY AS NEEDED (Patient taking differently: Take 30 g by mouth daily as needed (constipation). )  . HYDROcodone-acetaminophen (NORCO/VICODIN) 5-325 MG tablet Take 1 tablet by mouth every 6 (six) hours as needed for up to 30 doses for moderate pain or severe pain.  Marland Kitchen levothyroxine (SYNTHROID, LEVOTHROID) 75 MCG tablet Take 75 mcg by mouth daily.  . Melatonin 3 MG TABS Take 1 tablet (3 mg total) by mouth at bedtime as needed (sleep).  . metoprolol tartrate (LOPRESSOR) 25 MG tablet Take 0.5 tablets (12.5 mg total) by mouth 2 (two) times daily.  . midodrine (PROAMATINE) 5 MG tablet Take 1 tablet (5 mg total) by mouth 3 (three) times daily with meals.  Marland Kitchen omeprazole (PRILOSEC) 20 MG capsule Take 20 mg by mouth daily.   . ondansetron (ZOFRAN) 4 MG tablet Take 4  mg by mouth every 8 (eight) hours as needed for nausea or vomiting.  . psyllium (METAMUCIL SMOOTH TEXTURE) 28 % packet Take 1 packet by mouth daily.   . simvastatin (ZOCOR) 10 MG tablet Take 10 mg by mouth every evening.   . sodium chloride 1 g tablet Take 1 tablet (1 g total) by mouth 2 (two) times daily with a meal.  . SYRINGE/NEEDLE, DISP, 1 ML (BD LUER-LOK SYRINGE) 25G X 5/8" 1 ML MISC Use as directed to inject  Acthar  . Tamsulosin HCl (FLOMAX) 0.4 MG CAPS Take 0.4 mg by mouth daily.  Marland Kitchen warfarin (COUMADIN) 5 MG tablet TAKE 1 TABLET BY MOUTH  DAILY AS DIRECTED BY  COUMADIN CLINIC (Patient taking differently: Take 2.5-5 mg by mouth as directed. Take 4.5mg  Friday, Saturday,sunday, Monday, Tuesday 4mg  Wednesday thursday)  . zinc sulfate 220 (50 Zn) MG capsule Take 1 capsule (220 mg total) by mouth daily.     ROS: Pertinent items noted in HPI and remainder of comprehensive ROS otherwise negative.  Labs/Other Tests and Data Reviewed:    Recent Labs: 03/19/2019: TSH 2.060 08/22/2019: B Natriuretic Peptide 81.8 08/23/2019: ALT 36 08/26/2019: BUN 19; Creatinine, Ser 0.43; Magnesium 2.0; Potassium 4.0; Sodium 137 08/29/2019: Hemoglobin 13.7; Platelets 187   Recent Lipid Panel Lab Results  Component Value Date/Time   CHOL 111 03/19/2019 03:17 PM   TRIG 53 03/19/2019 03:17 PM   HDL 57 03/19/2019 03:17 PM   CHOLHDL 3.0 04/22/2010 05:36 AM   LDLCALC 43 03/19/2019 03:17 PM    Wt Readings from Last 3 Encounters:  09/01/19 180 lb (81.6 kg)  08/24/19 192 lb 14.4 oz (87.5 kg)  08/21/19 180 lb (81.6 kg)     Exam:    Vital Signs:  BP (!) 103/59   Pulse 77   Ht 5\' 2"  (1.575 m)   Wt 180 lb (81.6 kg)   BMI 32.92 kg/m    Exam deferred due to telephone visit  ASSESSMENT & PLAN:    1. Longstanding persistent  and not permanent A. fib 2. Orthostatic hypotension 3. Multiple sclerosis 4. Chronic diastolic heart failure  Ms. Purifoy has longstanding persistent if not permanent A. fib.  She had  recent orthostatic hypotension improved somewhat on midodrine.  She does have multiple sclerosis.  Cosyntropin stim test was negative.  There is chronic diastolic heart failure we need to avoid fludrocortisone or salt and she must maintain a diuretic.  No further recommendations at this time.  COVID-19 Education: The signs and symptoms of COVID-19 were discussed with the patient and how to seek care for testing (follow up with PCP or arrange E-visit).  The importance of social distancing was discussed today.  Patient Risk:   After full review of this patients clinical status, I feel that they are at least moderate risk at this time.  Time:   Today, I have spent 25 minutes with the patient with telehealth technology discussing A. fib, orthostatic hypotension..     Medication Adjustments/Labs and Tests Ordered: Current medicines are reviewed at length with the patient today.  Concerns regarding medicines are outlined above.   Tests Ordered: No orders of the defined types were placed in this encounter.   Medication Changes: No orders of the defined types were placed in this encounter.   Disposition:  in 6 month(s)  Pixie Casino, MD, [facility], [place] Director of the Advanced Lipid Disorders &  Cardiovascular Risk Reduction Clinic Diplomate of the American Board of Clinical Lipidology Attending Cardiologist  Direct Dial: [phone]  Fax: [phone]  Website:  [email or web address]  Pixie Casino, MD  09/01/2019 5:11 PM

## 2019-09-02 DIAGNOSIS — G35 Multiple sclerosis: Secondary | ICD-10-CM | POA: Diagnosis not present

## 2019-09-02 DIAGNOSIS — I951 Orthostatic hypotension: Secondary | ICD-10-CM | POA: Diagnosis not present

## 2019-09-02 DIAGNOSIS — I480 Paroxysmal atrial fibrillation: Secondary | ICD-10-CM | POA: Diagnosis not present

## 2019-09-02 DIAGNOSIS — U071 COVID-19: Secondary | ICD-10-CM | POA: Diagnosis not present

## 2019-09-03 ENCOUNTER — Encounter: Payer: Self-pay | Admitting: Neurology

## 2019-09-03 ENCOUNTER — Telehealth: Payer: Self-pay | Admitting: Neurology

## 2019-09-03 ENCOUNTER — Telehealth: Payer: Self-pay | Admitting: *Deleted

## 2019-09-03 DIAGNOSIS — R339 Retention of urine, unspecified: Secondary | ICD-10-CM | POA: Diagnosis not present

## 2019-09-03 DIAGNOSIS — M1991 Primary osteoarthritis, unspecified site: Secondary | ICD-10-CM | POA: Diagnosis not present

## 2019-09-03 DIAGNOSIS — Z9181 History of falling: Secondary | ICD-10-CM | POA: Diagnosis not present

## 2019-09-03 DIAGNOSIS — R531 Weakness: Secondary | ICD-10-CM | POA: Diagnosis not present

## 2019-09-03 DIAGNOSIS — Z23 Encounter for immunization: Secondary | ICD-10-CM | POA: Diagnosis not present

## 2019-09-03 DIAGNOSIS — M62838 Other muscle spasm: Secondary | ICD-10-CM | POA: Diagnosis not present

## 2019-09-03 DIAGNOSIS — R278 Other lack of coordination: Secondary | ICD-10-CM | POA: Diagnosis not present

## 2019-09-03 DIAGNOSIS — E785 Hyperlipidemia, unspecified: Secondary | ICD-10-CM | POA: Diagnosis not present

## 2019-09-03 DIAGNOSIS — K219 Gastro-esophageal reflux disease without esophagitis: Secondary | ICD-10-CM | POA: Diagnosis not present

## 2019-09-03 DIAGNOSIS — M79605 Pain in left leg: Secondary | ICD-10-CM | POA: Diagnosis not present

## 2019-09-03 DIAGNOSIS — R609 Edema, unspecified: Secondary | ICD-10-CM | POA: Diagnosis not present

## 2019-09-03 DIAGNOSIS — I4891 Unspecified atrial fibrillation: Secondary | ICD-10-CM | POA: Diagnosis not present

## 2019-09-03 DIAGNOSIS — G35 Multiple sclerosis: Secondary | ICD-10-CM | POA: Diagnosis not present

## 2019-09-03 DIAGNOSIS — R2681 Unsteadiness on feet: Secondary | ICD-10-CM | POA: Diagnosis not present

## 2019-09-03 DIAGNOSIS — G4700 Insomnia, unspecified: Secondary | ICD-10-CM | POA: Diagnosis not present

## 2019-09-03 DIAGNOSIS — I482 Chronic atrial fibrillation, unspecified: Secondary | ICD-10-CM | POA: Diagnosis not present

## 2019-09-03 DIAGNOSIS — S3210XD Unspecified fracture of sacrum, subsequent encounter for fracture with routine healing: Secondary | ICD-10-CM | POA: Diagnosis not present

## 2019-09-03 DIAGNOSIS — Z5181 Encounter for therapeutic drug level monitoring: Secondary | ICD-10-CM | POA: Diagnosis not present

## 2019-09-03 DIAGNOSIS — I110 Hypertensive heart disease with heart failure: Secondary | ICD-10-CM | POA: Diagnosis not present

## 2019-09-03 DIAGNOSIS — F419 Anxiety disorder, unspecified: Secondary | ICD-10-CM | POA: Diagnosis not present

## 2019-09-03 DIAGNOSIS — Z9689 Presence of other specified functional implants: Secondary | ICD-10-CM | POA: Diagnosis not present

## 2019-09-03 DIAGNOSIS — I5040 Unspecified combined systolic (congestive) and diastolic (congestive) heart failure: Secondary | ICD-10-CM | POA: Diagnosis not present

## 2019-09-03 DIAGNOSIS — F329 Major depressive disorder, single episode, unspecified: Secondary | ICD-10-CM | POA: Diagnosis not present

## 2019-09-03 DIAGNOSIS — I959 Hypotension, unspecified: Secondary | ICD-10-CM | POA: Diagnosis not present

## 2019-09-03 DIAGNOSIS — K5900 Constipation, unspecified: Secondary | ICD-10-CM | POA: Diagnosis not present

## 2019-09-03 DIAGNOSIS — G4733 Obstructive sleep apnea (adult) (pediatric): Secondary | ICD-10-CM | POA: Diagnosis not present

## 2019-09-03 DIAGNOSIS — E119 Type 2 diabetes mellitus without complications: Secondary | ICD-10-CM | POA: Diagnosis not present

## 2019-09-03 DIAGNOSIS — I480 Paroxysmal atrial fibrillation: Secondary | ICD-10-CM | POA: Diagnosis not present

## 2019-09-03 DIAGNOSIS — M6281 Muscle weakness (generalized): Secondary | ICD-10-CM | POA: Diagnosis not present

## 2019-09-03 DIAGNOSIS — I872 Venous insufficiency (chronic) (peripheral): Secondary | ICD-10-CM | POA: Diagnosis not present

## 2019-09-03 DIAGNOSIS — E039 Hypothyroidism, unspecified: Secondary | ICD-10-CM | POA: Diagnosis not present

## 2019-09-03 NOTE — Telephone Encounter (Signed)
PA completed through cover my meds/[employer] KEY:[id number] Will wait for response

## 2019-09-03 NOTE — Telephone Encounter (Signed)
I spoke to the patient. States she is currently in rehab in [facility]. She is being transferred to [facility] in [facility] today to finish out the remainder of therapy. She is transferring due to dissatisfaction with care at [facility]. She tells me she has orders at the facility for baclofen 20mg , one tablet TID PRN.  She has been asking for it twice daily but plans to try it three times daily on a more routine basis to help with her increased spasms.  Dr. [name] requested that I make this call to check on the patient. He is willing to work her in to make adjustments to her baclofen pump. Once she gets transferred and settled, she is going to ask her daughter to try to arrange transportation to our office.  She is currently using the facility phone. If our office needs to call her back, she ask that we contact her daughter. Marletta Lor [phone].

## 2019-09-05 DIAGNOSIS — I482 Chronic atrial fibrillation, unspecified: Secondary | ICD-10-CM | POA: Diagnosis not present

## 2019-09-05 DIAGNOSIS — G35 Multiple sclerosis: Secondary | ICD-10-CM | POA: Diagnosis not present

## 2019-09-05 DIAGNOSIS — R531 Weakness: Secondary | ICD-10-CM | POA: Diagnosis not present

## 2019-09-05 DIAGNOSIS — I959 Hypotension, unspecified: Secondary | ICD-10-CM | POA: Diagnosis not present

## 2019-09-05 NOTE — Telephone Encounter (Signed)
Thank You Toni [name]. I hope the move will be smooth and increased oral Baclofen will help her.

## 2019-09-08 ENCOUNTER — Encounter: Payer: Self-pay | Admitting: Neurology

## 2019-09-08 NOTE — Telephone Encounter (Signed)
Received a denial. Appeal letter was already completed and faxed first thing this am for the patient. Received a confirmation of the fax going through to the standard appeal fax # provided in denial letter.

## 2019-09-08 NOTE — Telephone Encounter (Signed)
[email or web address] called to report on Saturday the decision was made to deny the PA.  Miranda wanted to  Know if there would be an appeal and if so RN can call [phone] to appeal

## 2019-09-09 ENCOUNTER — Telehealth: Payer: Self-pay | Admitting: Pharmacist Clinician (PhC)/ Clinical Pharmacy Specialist

## 2019-09-09 NOTE — Telephone Encounter (Signed)
[place]  Dept. Has called to report  The approval of the medication effective 09-03-19 to 08-20-20.

## 2019-09-09 NOTE — Telephone Encounter (Signed)
Pt called to report still having problems with hypotension, especially morning, early afternoon, when she does her therapies.   Per patient report, taking midodrine at 8 am, 6 pm and 10 pm.   Called facility and asked that they change time of doses to 8 am, noon and 6 pm.  Patient aware to call back in another week to let us know if any improvement.  If pressures still regularly dropping to < [id number] systolic, will consider increasing morning/noon doses to 10 mg

## 2019-09-15 ENCOUNTER — Telehealth: Payer: Self-pay

## 2019-09-15 DIAGNOSIS — R609 Edema, unspecified: Secondary | ICD-10-CM | POA: Diagnosis not present

## 2019-09-15 DIAGNOSIS — I4891 Unspecified atrial fibrillation: Secondary | ICD-10-CM | POA: Diagnosis not present

## 2019-09-15 DIAGNOSIS — I959 Hypotension, unspecified: Secondary | ICD-10-CM | POA: Diagnosis not present

## 2019-09-15 DIAGNOSIS — R531 Weakness: Secondary | ICD-10-CM | POA: Diagnosis not present

## 2019-09-15 NOTE — Telephone Encounter (Signed)
Pt calling in to report bp but left a message saying she would like a call back regarding some medication issues. Please call the number [phone] at [place].

## 2019-09-15 NOTE — Telephone Encounter (Signed)
Spoke with patient.  States feeling better with BP readings now on midodrine 5 mg tid (8am, 12, 6pm).  But also notes nausea regularly since starting.  Advised that she skip 6 pm dose tonight and see if that makes her feel any better.  Cannot use Florinef due to CHF.  Could consider Northera (patient has MS), but cost always an issue with that.  She will let us know if the nausea resolves in the next few days.

## 2019-09-16 DIAGNOSIS — F329 Major depressive disorder, single episode, unspecified: Secondary | ICD-10-CM | POA: Diagnosis not present

## 2019-09-16 DIAGNOSIS — G4700 Insomnia, unspecified: Secondary | ICD-10-CM | POA: Diagnosis not present

## 2019-09-16 DIAGNOSIS — F419 Anxiety disorder, unspecified: Secondary | ICD-10-CM | POA: Diagnosis not present

## 2019-09-18 ENCOUNTER — Telehealth: Payer: Self-pay | Admitting: Neurology

## 2019-09-18 DIAGNOSIS — R609 Edema, unspecified: Secondary | ICD-10-CM | POA: Diagnosis not present

## 2019-09-18 NOTE — Telephone Encounter (Signed)
Pt has called so Dr [name] can be aware that re: her pump refill for next week she will be unable to sit on the table.  Pt is asking if this can be done with her sitting in the wheel chair.  Pt is currently in rehab(they are bringing her from [place]) she can be reached at [phone]

## 2019-09-18 NOTE — Telephone Encounter (Signed)
I tried to call pts listed number in message and several times the phone had a busy signal.

## 2019-09-22 ENCOUNTER — Telehealth: Payer: Self-pay

## 2019-09-22 DIAGNOSIS — R609 Edema, unspecified: Secondary | ICD-10-CM | POA: Diagnosis not present

## 2019-09-22 DIAGNOSIS — M79605 Pain in left leg: Secondary | ICD-10-CM | POA: Diagnosis not present

## 2019-09-22 NOTE — Telephone Encounter (Signed)
Pt called asked to speak with Toni [name], very upset concerning blood pressure left an urgent msg on our machine that bp today was 84/48.

## 2019-09-22 NOTE — Telephone Encounter (Signed)
I called [facility] facility and spoke with [facility] Rn for pt. She has appt down for 1100 leave time for her appt on 09/24/2019 at 1200pm. I stated its for baclofen pump refill. She stated pt is out of isolation/quarantine. PT is not having any symptoms.

## 2019-09-22 NOTE — Telephone Encounter (Signed)
Doing the procedure in the wheelchair is certainly more difficult, but it can be done.

## 2019-09-22 NOTE — Telephone Encounter (Signed)
Per Dr. [name] pt can be added back to 1200pm slot. Pt reschedule on 09/24/2019 at 1200pm.

## 2019-09-22 NOTE — Telephone Encounter (Signed)
I called pts husband on dpr. He was inquiring about pts appt and the pump refill. I stated the appt was cancel with the automatic system. The husband stated he did not cancel appt because his wife is in rehab. I stated message will be sent back to Dr. [name] if 1200pm appt can be added back. The husband verbalized understanding.

## 2019-09-22 NOTE — Telephone Encounter (Signed)
The alarm date is on 06 October 2019, she will need to come in before this date to have the pump refill.

## 2019-09-22 NOTE — Telephone Encounter (Addendum)
Pts husband Chriss Czar is returning call to discuss patient pump refill  CB# [phone]

## 2019-09-22 NOTE — Telephone Encounter (Signed)
I called pts husband about pts appt on 09/24/2019 was put in for 1200pm. He stated pts wife call last week about if pump can be done in wheelchair and no one never call back. I stated a call was made 1/28/201 and the phone was busy, a vm could not be left. He stated pts legs are weaker now and she will be unable to get on table. I ask him was pt being quarantine because of being positive earlier this month.The husband stated she was not in isolation and tested negative later on.

## 2019-09-22 NOTE — Telephone Encounter (Signed)
Spoke with patient.  Advised she document for 2-3 weeks.  If continues to have hypotension in the mornings, may need to increase AM dose of midodrine.    Still having problems with nausea/dizziness, but daughter pointed out that she had those symptoms 2-3 weeks prior to starting midodrine.

## 2019-09-23 ENCOUNTER — Other Ambulatory Visit: Payer: Self-pay | Admitting: Internal Medicine

## 2019-09-23 DIAGNOSIS — Z23 Encounter for immunization: Secondary | ICD-10-CM | POA: Diagnosis not present

## 2019-09-23 NOTE — Telephone Encounter (Signed)
lmom for pt to call back regarding who is managing coumadin

## 2019-09-24 ENCOUNTER — Ambulatory Visit (INDEPENDENT_AMBULATORY_CARE_PROVIDER_SITE_OTHER): Payer: Medicare Other | Admitting: Neurology

## 2019-09-24 ENCOUNTER — Other Ambulatory Visit: Payer: Self-pay

## 2019-09-24 ENCOUNTER — Ambulatory Visit: Payer: Self-pay | Admitting: Neurology

## 2019-09-24 ENCOUNTER — Encounter: Payer: Self-pay | Admitting: Neurology

## 2019-09-24 VITALS — BP 131/85 | HR 53 | Temp 97.9°F

## 2019-09-24 DIAGNOSIS — M62838 Other muscle spasm: Secondary | ICD-10-CM

## 2019-09-24 DIAGNOSIS — G35 Multiple sclerosis: Secondary | ICD-10-CM | POA: Diagnosis not present

## 2019-09-24 MED ORDER — BACLOFEN 40 MG/20ML IT SOLN
40.0000 mg | Freq: Once | INTRATHECAL | Status: AC
  Administered 2019-09-24: 40 mg via INTRATHECAL

## 2019-09-24 NOTE — Progress Notes (Signed)
Please refer to baclofen pump refill note.

## 2019-09-24 NOTE — Procedures (Signed)
     History:  Toni [name] is a 74 year old patient with a history of multiple sclerosis with a spastic paraparesis.  The patient recently has been in the hospital and subsequently in an extended care facility with an exacerbation of her multiple sclerosis with right-sided weakness.  She has not been ambulatory.  She believes that the spasticity of her abdomen has worsened.  She has more weakness of the right leg.  She comes in for a baclofen pump refill.  Baclofen pump refill note  The baclofen pump site was cleaned with Betadine solution. A 21-gauge needle was inserted into the pump port site. Approximately 11 cc of residual baclofen was removed, the pump indicates a 6.5 residual.40 cc of replacement baclofen was placed into the pump at 2000 mcg/cc concentration.  The pump was reprogrammed for the following settings: The pump was increased from a simple continuous rate of 799.5 mcg/day to a simple continuous rate of 819.4 mcg daily, a 2% increase.  The alarm volume is set at 1.5 cc. The next alarm date is Dec 26, 2019.  The patient tolerated the procedure well. There were no complications of the above procedure.  The [facility] number is [id number] The baclofen expiration date is December 2022. The baclofen lot number is [id number].

## 2019-09-30 ENCOUNTER — Telehealth: Payer: Self-pay | Admitting: Pharmacist Clinician (PhC)/ Clinical Pharmacy Specialist

## 2019-09-30 MED ORDER — MIDODRINE HCL 5 MG PO TABS: 5.0000 mg | ORAL_TABLET | Freq: Three times a day (TID) | ORAL | 3 refills | Status: DC

## 2019-09-30 MED ORDER — FUROSEMIDE 20 MG PO TABS: ORAL_TABLET | ORAL | 3 refills | Status: DC

## 2019-09-30 MED ORDER — METOPROLOL TARTRATE 25 MG PO TABS: 12.5000 mg | ORAL_TABLET | Freq: Two times a day (BID) | ORAL | 3 refills | Status: DC

## 2019-09-30 NOTE — Telephone Encounter (Signed)
Pt will be returning home next week after staying in [facility] rehab.  Needs refills on furosemide, metoprolol and midodrine.  Prescriptions sent to [facility]

## 2019-10-02 ENCOUNTER — Other Ambulatory Visit: Payer: Self-pay | Admitting: *Deleted

## 2019-10-02 NOTE — Patient Outreach (Signed)
Member screened for potential [facility] Care Management needs as a benefit of [place] Medicare.  [name] is currently receiving skilled therapy at [facility].  [facility] RN previously indicated member will transition to home on 10/07/19.  Telephone call made to Toni [name] (daughter) [phone] to discuss [facility] services. Patient identifiers confirmed.   Toni [name] confirms Toni [name] is slated to transition to home on 10/07/19 with [place] and husband. Toni [name] will have private paid caregivers 24/7.   Toni [name] states member now requires a hospital bed and hoyer lift. States member did not need this prior. Toni [name] endorses that her mother recently had COVID and has MS.   Due to high risk for readmission, [facility] discussed. Toni [name] states she should be contacted by [facility] to schedule visit.   Explained [facility] will not interfere or replace services provided by home health.   Writer to send writer's contact information via email.   Writer to make referral to [facility].   Will continue to follow while member resides in SNF.    Toni Rolling, MSN-Ed, RN,BSN [place] Acute Care Coordinator [phone] [facility]) [phone]  (Toll free office)

## 2019-10-08 ENCOUNTER — Ambulatory Visit (INDEPENDENT_AMBULATORY_CARE_PROVIDER_SITE_OTHER): Payer: Medicare Other | Admitting: Pharmacist

## 2019-10-08 DIAGNOSIS — I5032 Chronic diastolic (congestive) heart failure: Secondary | ICD-10-CM | POA: Diagnosis not present

## 2019-10-08 DIAGNOSIS — M549 Dorsalgia, unspecified: Secondary | ICD-10-CM | POA: Diagnosis not present

## 2019-10-08 DIAGNOSIS — Z7901 Long term (current) use of anticoagulants: Secondary | ICD-10-CM | POA: Diagnosis not present

## 2019-10-08 DIAGNOSIS — M170 Bilateral primary osteoarthritis of knee: Secondary | ICD-10-CM | POA: Diagnosis not present

## 2019-10-08 DIAGNOSIS — I8393 Asymptomatic varicose veins of bilateral lower extremities: Secondary | ICD-10-CM | POA: Diagnosis not present

## 2019-10-08 DIAGNOSIS — F419 Anxiety disorder, unspecified: Secondary | ICD-10-CM | POA: Diagnosis not present

## 2019-10-08 DIAGNOSIS — K219 Gastro-esophageal reflux disease without esophagitis: Secondary | ICD-10-CM | POA: Diagnosis not present

## 2019-10-08 DIAGNOSIS — G8929 Other chronic pain: Secondary | ICD-10-CM | POA: Diagnosis not present

## 2019-10-08 DIAGNOSIS — Z9689 Presence of other specified functional implants: Secondary | ICD-10-CM | POA: Diagnosis not present

## 2019-10-08 DIAGNOSIS — Z86718 Personal history of other venous thrombosis and embolism: Secondary | ICD-10-CM | POA: Diagnosis not present

## 2019-10-08 DIAGNOSIS — F329 Major depressive disorder, single episode, unspecified: Secondary | ICD-10-CM | POA: Diagnosis not present

## 2019-10-08 DIAGNOSIS — E039 Hypothyroidism, unspecified: Secondary | ICD-10-CM | POA: Diagnosis not present

## 2019-10-08 DIAGNOSIS — I4811 Longstanding persistent atrial fibrillation: Secondary | ICD-10-CM | POA: Diagnosis not present

## 2019-10-08 DIAGNOSIS — M62838 Other muscle spasm: Secondary | ICD-10-CM | POA: Diagnosis not present

## 2019-10-08 DIAGNOSIS — G43909 Migraine, unspecified, not intractable, without status migrainosus: Secondary | ICD-10-CM | POA: Diagnosis not present

## 2019-10-08 DIAGNOSIS — G35 Multiple sclerosis: Secondary | ICD-10-CM | POA: Diagnosis not present

## 2019-10-08 DIAGNOSIS — G4733 Obstructive sleep apnea (adult) (pediatric): Secondary | ICD-10-CM | POA: Diagnosis not present

## 2019-10-08 DIAGNOSIS — I4891 Unspecified atrial fibrillation: Secondary | ICD-10-CM | POA: Diagnosis not present

## 2019-10-08 DIAGNOSIS — K5900 Constipation, unspecified: Secondary | ICD-10-CM | POA: Diagnosis not present

## 2019-10-08 DIAGNOSIS — J45909 Unspecified asthma, uncomplicated: Secondary | ICD-10-CM | POA: Diagnosis not present

## 2019-10-08 DIAGNOSIS — I110 Hypertensive heart disease with heart failure: Secondary | ICD-10-CM | POA: Diagnosis not present

## 2019-10-08 DIAGNOSIS — I951 Orthostatic hypotension: Secondary | ICD-10-CM | POA: Diagnosis not present

## 2019-10-08 DIAGNOSIS — Z79899 Other long term (current) drug therapy: Secondary | ICD-10-CM | POA: Diagnosis not present

## 2019-10-08 DIAGNOSIS — E785 Hyperlipidemia, unspecified: Secondary | ICD-10-CM | POA: Diagnosis not present

## 2019-10-08 DIAGNOSIS — Z87891 Personal history of nicotine dependence: Secondary | ICD-10-CM | POA: Diagnosis not present

## 2019-10-08 LAB — POCT INR: INR: 2.9 (ref 2.0–3.0)

## 2019-10-09 ENCOUNTER — Encounter (HOSPITAL_COMMUNITY): Payer: Self-pay

## 2019-10-09 ENCOUNTER — Other Ambulatory Visit: Payer: Self-pay

## 2019-10-09 ENCOUNTER — Telehealth: Payer: Self-pay | Admitting: Pharmacist

## 2019-10-09 ENCOUNTER — Inpatient Hospital Stay (HOSPITAL_COMMUNITY)
Admission: EM | Admit: 2019-10-09 | Discharge: 2019-10-17 | DRG: 871 | Disposition: A | Discharge status: Home / Self Care | Payer: Medicare Other | Attending: Internal Medicine | Admitting: Internal Medicine

## 2019-10-09 ENCOUNTER — Emergency Department (HOSPITAL_COMMUNITY): Payer: Medicare Other

## 2019-10-09 DIAGNOSIS — Z86718 Personal history of other venous thrombosis and embolism: Secondary | ICD-10-CM

## 2019-10-09 DIAGNOSIS — R0902 Hypoxemia: Secondary | ICD-10-CM | POA: Diagnosis not present

## 2019-10-09 DIAGNOSIS — Z79899 Other long term (current) drug therapy: Secondary | ICD-10-CM | POA: Diagnosis not present

## 2019-10-09 DIAGNOSIS — Z885 Allergy status to narcotic agent status: Secondary | ICD-10-CM | POA: Diagnosis not present

## 2019-10-09 DIAGNOSIS — Z8616 Personal history of COVID-19: Secondary | ICD-10-CM

## 2019-10-09 DIAGNOSIS — I4891 Unspecified atrial fibrillation: Secondary | ICD-10-CM | POA: Diagnosis not present

## 2019-10-09 DIAGNOSIS — R791 Abnormal coagulation profile: Secondary | ICD-10-CM | POA: Diagnosis not present

## 2019-10-09 DIAGNOSIS — Z6833 Body mass index (BMI) 33.0-33.9, adult: Secondary | ICD-10-CM

## 2019-10-09 DIAGNOSIS — I110 Hypertensive heart disease with heart failure: Secondary | ICD-10-CM | POA: Diagnosis present

## 2019-10-09 DIAGNOSIS — F329 Major depressive disorder, single episode, unspecified: Secondary | ICD-10-CM | POA: Diagnosis present

## 2019-10-09 DIAGNOSIS — Z7901 Long term (current) use of anticoagulants: Secondary | ICD-10-CM

## 2019-10-09 DIAGNOSIS — E785 Hyperlipidemia, unspecified: Secondary | ICD-10-CM | POA: Diagnosis present

## 2019-10-09 DIAGNOSIS — A419 Sepsis, unspecified organism: Secondary | ICD-10-CM | POA: Diagnosis present

## 2019-10-09 DIAGNOSIS — G35 Multiple sclerosis: Secondary | ICD-10-CM | POA: Diagnosis present

## 2019-10-09 DIAGNOSIS — J189 Pneumonia, unspecified organism: Secondary | ICD-10-CM | POA: Diagnosis present

## 2019-10-09 DIAGNOSIS — Z888 Allergy status to other drugs, medicaments and biological substances status: Secondary | ICD-10-CM | POA: Diagnosis not present

## 2019-10-09 DIAGNOSIS — I5033 Acute on chronic diastolic (congestive) heart failure: Secondary | ICD-10-CM | POA: Diagnosis present

## 2019-10-09 DIAGNOSIS — E039 Hypothyroidism, unspecified: Secondary | ICD-10-CM | POA: Diagnosis present

## 2019-10-09 DIAGNOSIS — Z79890 Hormone replacement therapy (postmenopausal): Secondary | ICD-10-CM | POA: Diagnosis not present

## 2019-10-09 DIAGNOSIS — G4730 Sleep apnea, unspecified: Secondary | ICD-10-CM | POA: Diagnosis present

## 2019-10-09 DIAGNOSIS — K5641 Fecal impaction: Secondary | ICD-10-CM | POA: Diagnosis present

## 2019-10-09 DIAGNOSIS — Z66 Do not resuscitate: Secondary | ICD-10-CM | POA: Diagnosis present

## 2019-10-09 DIAGNOSIS — R0602 Shortness of breath: Secondary | ICD-10-CM

## 2019-10-09 DIAGNOSIS — R000 Tachycardia, unspecified: Secondary | ICD-10-CM | POA: Diagnosis not present

## 2019-10-09 DIAGNOSIS — Z87440 Personal history of urinary (tract) infections: Secondary | ICD-10-CM

## 2019-10-09 DIAGNOSIS — J9621 Acute and chronic respiratory failure with hypoxia: Secondary | ICD-10-CM | POA: Diagnosis present

## 2019-10-09 DIAGNOSIS — J9601 Acute respiratory failure with hypoxia: Secondary | ICD-10-CM

## 2019-10-09 DIAGNOSIS — R6520 Severe sepsis without septic shock: Secondary | ICD-10-CM

## 2019-10-09 DIAGNOSIS — Y95 Nosocomial condition: Secondary | ICD-10-CM | POA: Diagnosis present

## 2019-10-09 DIAGNOSIS — I4819 Other persistent atrial fibrillation: Secondary | ICD-10-CM | POA: Diagnosis present

## 2019-10-09 DIAGNOSIS — G4733 Obstructive sleep apnea (adult) (pediatric): Secondary | ICD-10-CM | POA: Diagnosis present

## 2019-10-09 DIAGNOSIS — E669 Obesity, unspecified: Secondary | ICD-10-CM | POA: Diagnosis present

## 2019-10-09 DIAGNOSIS — Z8349 Family history of other endocrine, nutritional and metabolic diseases: Secondary | ICD-10-CM

## 2019-10-09 DIAGNOSIS — Z981 Arthrodesis status: Secondary | ICD-10-CM

## 2019-10-09 DIAGNOSIS — Z87891 Personal history of nicotine dependence: Secondary | ICD-10-CM

## 2019-10-09 DIAGNOSIS — I480 Paroxysmal atrial fibrillation: Secondary | ICD-10-CM

## 2019-10-09 DIAGNOSIS — K219 Gastro-esophageal reflux disease without esophagitis: Secondary | ICD-10-CM | POA: Diagnosis present

## 2019-10-09 DIAGNOSIS — Z8701 Personal history of pneumonia (recurrent): Secondary | ICD-10-CM

## 2019-10-09 DIAGNOSIS — Z993 Dependence on wheelchair: Secondary | ICD-10-CM | POA: Diagnosis not present

## 2019-10-09 DIAGNOSIS — Z8249 Family history of ischemic heart disease and other diseases of the circulatory system: Secondary | ICD-10-CM

## 2019-10-09 DIAGNOSIS — R069 Unspecified abnormalities of breathing: Secondary | ICD-10-CM | POA: Diagnosis not present

## 2019-10-09 LAB — URINALYSIS, ROUTINE W REFLEX MICROSCOPIC
Bilirubin Urine: NEGATIVE
Glucose, UA: NEGATIVE mg/dL
Hgb urine dipstick: NEGATIVE
Ketones, ur: 20 mg/dL — AB
Leukocytes,Ua: NEGATIVE
Nitrite: NEGATIVE
Protein, ur: NEGATIVE mg/dL
Specific Gravity, Urine: 1.016 (ref 1.005–1.030)
pH: 8 (ref 5.0–8.0)

## 2019-10-09 LAB — MAGNESIUM: Magnesium: 2.1 mg/dL (ref 1.7–2.4)

## 2019-10-09 LAB — PROCALCITONIN: Procalcitonin: <0.1 ng/mL

## 2019-10-09 LAB — CBC WITH DIFFERENTIAL/PLATELET
Abs Immature Granulocytes: 0.03 10*3/uL (ref 0.00–0.07)
Basophils Absolute: 0.1 10*3/uL (ref 0.0–0.1)
Basophils Relative: 0 %
Eosinophils Absolute: 0.2 10*3/uL (ref 0.0–0.5)
Eosinophils Relative: 2 %
HCT: 44 % (ref 36.0–46.0)
Hemoglobin: 14.3 g/dL (ref 12.0–15.0)
Immature Granulocytes: 0 %
Lymphocytes Relative: 11 %
Lymphs Abs: 1.5 10*3/uL (ref 0.7–4.0)
MCH: 31.1 pg (ref 26.0–34.0)
MCHC: 32.5 g/dL (ref 30.0–36.0)
MCV: 95.7 fL (ref 80.0–100.0)
Monocytes Absolute: 1.7 10*3/uL — ABNORMAL HIGH (ref 0.1–1.0)
Monocytes Relative: 12 %
Neutro Abs: 9.9 10*3/uL — ABNORMAL HIGH (ref 1.7–7.7)
Neutrophils Relative %: 75 %
Platelets: 197 10*3/uL (ref 150–400)
RBC: 4.6 MIL/uL (ref 3.87–5.11)
RDW: 14.5 % (ref 11.5–15.5)
WBC: 13.4 10*3/uL — ABNORMAL HIGH (ref 4.0–10.5)
nRBC: 0 % (ref 0.0–0.2)

## 2019-10-09 LAB — COMPREHENSIVE METABOLIC PANEL
ALT: 21 U/L (ref 0–44)
AST: 15 U/L (ref 15–41)
Albumin: 3.6 g/dL (ref 3.5–5.0)
Alkaline Phosphatase: 59 U/L (ref 38–126)
Anion gap: 9 (ref 5–15)
BUN: 19 mg/dL (ref 8–23)
CO2: 33 mmol/L — ABNORMAL HIGH (ref 22–32)
Calcium: 8.9 mg/dL (ref 8.9–10.3)
Chloride: 100 mmol/L (ref 98–111)
Creatinine, Ser: 0.44 mg/dL (ref 0.44–1.00)
GFR calc Af Amer: >60 mL/min (ref >60)
GFR calc non Af Amer: >60 mL/min (ref >60)
Glucose, Bld: 92 mg/dL (ref 70–99)
Potassium: 3.8 mmol/L (ref 3.5–5.1)
Sodium: 142 mmol/L (ref 135–145)
Total Bilirubin: 0.9 mg/dL (ref 0.3–1.2)
Total Protein: 6.4 g/dL — ABNORMAL LOW (ref 6.5–8.1)

## 2019-10-09 LAB — PROTIME-INR
INR: 2.6 — ABNORMAL HIGH (ref 0.8–1.2)
Prothrombin Time: 27.9 seconds — ABNORMAL HIGH (ref 11.4–15.2)

## 2019-10-09 LAB — LACTIC ACID, PLASMA: Lactic Acid, Venous: 1.1 mmol/L (ref 0.5–1.9)

## 2019-10-09 LAB — MRSA PCR SCREENING: MRSA by PCR: NEGATIVE

## 2019-10-09 LAB — BRAIN NATRIURETIC PEPTIDE: B Natriuretic Peptide: 381.6 pg/mL — ABNORMAL HIGH (ref 0.0–100.0)

## 2019-10-09 LAB — STREP PNEUMONIAE URINARY ANTIGEN: Strep Pneumo Urinary Antigen: NEGATIVE

## 2019-10-09 MED ORDER — SODIUM CHLORIDE 0.9 % IV SOLN
2.0000 g | Freq: Once | INTRAVENOUS | Status: AC
  Administered 2019-10-09: 2 g via INTRAVENOUS
  Filled 2019-10-09: qty 2

## 2019-10-09 MED ORDER — ACETAMINOPHEN 650 MG RE SUPP: 650.0000 mg | Freq: Four times a day (QID) | RECTAL | Status: DC | PRN

## 2019-10-09 MED ORDER — WARFARIN - PHARMACIST DOSING INPATIENT: Freq: Every day | Status: DC

## 2019-10-09 MED ORDER — ONDANSETRON HCL 4 MG/2ML IJ SOLN: 4.0000 mg | Freq: Four times a day (QID) | INTRAMUSCULAR | Status: DC | PRN

## 2019-10-09 MED ORDER — SODIUM CHLORIDE 0.9 % IV SOLN: 1.0000 g | Freq: Once | INTRAVENOUS | Status: DC

## 2019-10-09 MED ORDER — GABAPENTIN 300 MG PO CAPS
300.0000 mg | ORAL_CAPSULE | Freq: Three times a day (TID) | ORAL | Status: DC
  Administered 2019-10-09 – 2019-10-17 (×23): 300 mg via ORAL
  Filled 2019-10-09 (×23): qty 1

## 2019-10-09 MED ORDER — LEVOTHYROXINE SODIUM 50 MCG PO TABS
75.0000 ug | ORAL_TABLET | Freq: Every day | ORAL | Status: DC
  Administered 2019-10-10 – 2019-10-17 (×8): 75 ug via ORAL
  Filled 2019-10-09 (×9): qty 1

## 2019-10-09 MED ORDER — BUPROPION HCL ER (XL) 300 MG PO TB24
300.0000 mg | ORAL_TABLET | Freq: Every day | ORAL | Status: DC
  Administered 2019-10-10 – 2019-10-17 (×8): 300 mg via ORAL
  Filled 2019-10-09 (×8): qty 1

## 2019-10-09 MED ORDER — ONDANSETRON HCL 4 MG PO TABS: 4.0000 mg | ORAL_TABLET | Freq: Four times a day (QID) | ORAL | Status: DC | PRN

## 2019-10-09 MED ORDER — FUROSEMIDE 10 MG/ML IJ SOLN
40.0000 mg | Freq: Once | INTRAMUSCULAR | Status: AC
  Administered 2019-10-09: 40 mg via INTRAVENOUS
  Filled 2019-10-09: qty 4

## 2019-10-09 MED ORDER — VANCOMYCIN HCL 1500 MG/300ML IV SOLN
1500.0000 mg | Freq: Once | INTRAVENOUS | Status: AC
  Administered 2019-10-09: 1500 mg via INTRAVENOUS
  Filled 2019-10-09: qty 300

## 2019-10-09 MED ORDER — SODIUM CHLORIDE 0.9% FLUSH
3.0000 mL | Freq: Two times a day (BID) | INTRAVENOUS | Status: DC
  Administered 2019-10-09 – 2019-10-17 (×16): 3 mL via INTRAVENOUS

## 2019-10-09 MED ORDER — POTASSIUM CHLORIDE CRYS ER 20 MEQ PO TBCR
40.0000 meq | EXTENDED_RELEASE_TABLET | Freq: Once | ORAL | Status: AC
  Administered 2019-10-09: 22:00:00 40 meq via ORAL
  Filled 2019-10-09: qty 2

## 2019-10-09 MED ORDER — MELATONIN 3 MG PO TABS
1.0000 | ORAL_TABLET | Freq: Every evening | ORAL | Status: DC | PRN
  Administered 2019-10-09 – 2019-10-16 (×7): 3 mg via ORAL
  Filled 2019-10-09 (×8): qty 1

## 2019-10-09 MED ORDER — SODIUM CHLORIDE 0.9 % IV SOLN
2.0000 g | Freq: Three times a day (TID) | INTRAVENOUS | Status: DC
  Administered 2019-10-10 – 2019-10-11 (×4): 2 g via INTRAVENOUS
  Filled 2019-10-09 (×4): qty 2

## 2019-10-09 MED ORDER — ALBUTEROL SULFATE (2.5 MG/3ML) 0.083% IN NEBU: 3.0000 mL | INHALATION_SOLUTION | RESPIRATORY_TRACT | Status: DC | PRN

## 2019-10-09 MED ORDER — ACETAMINOPHEN 500 MG PO TABS
500.0000 mg | ORAL_TABLET | Freq: Once | ORAL | Status: AC
  Administered 2019-10-09: 500 mg via ORAL
  Filled 2019-10-09: qty 1

## 2019-10-09 MED ORDER — SIMVASTATIN 10 MG PO TABS
10.0000 mg | ORAL_TABLET | Freq: Every evening | ORAL | Status: DC
  Administered 2019-10-09 – 2019-10-16 (×8): 10 mg via ORAL
  Filled 2019-10-09 (×9): qty 1

## 2019-10-09 MED ORDER — WARFARIN SODIUM 5 MG PO TABS
5.0000 mg | ORAL_TABLET | Freq: Once | ORAL | Status: AC
  Administered 2019-10-09: 5 mg via ORAL
  Filled 2019-10-09: qty 1

## 2019-10-09 MED ORDER — DILTIAZEM HCL-DEXTROSE 125-5 MG/125ML-% IV SOLN (PREMIX): 5.0000 mg/h | INTRAVENOUS | Status: DC

## 2019-10-09 MED ORDER — SODIUM CHLORIDE 0.9 % IV SOLN
500.0000 mg | INTRAVENOUS | Status: DC
  Administered 2019-10-09 – 2019-10-10 (×2): 500 mg via INTRAVENOUS
  Filled 2019-10-09 (×2): qty 500

## 2019-10-09 MED ORDER — ORAL CARE MOUTH RINSE
15.0000 mL | Freq: Two times a day (BID) | OROMUCOSAL | Status: DC
  Administered 2019-10-09 – 2019-10-17 (×16): 15 mL via OROMUCOSAL

## 2019-10-09 MED ORDER — ACETAMINOPHEN 325 MG PO TABS
650.0000 mg | ORAL_TABLET | Freq: Four times a day (QID) | ORAL | Status: DC | PRN
  Administered 2019-10-10: 650 mg via ORAL
  Filled 2019-10-09: qty 2

## 2019-10-09 MED ORDER — FAMOTIDINE 20 MG PO TABS
20.0000 mg | ORAL_TABLET | Freq: Every day | ORAL | Status: DC
  Administered 2019-10-09 – 2019-10-16 (×8): 20 mg via ORAL
  Filled 2019-10-09 (×8): qty 1

## 2019-10-09 MED ORDER — DILTIAZEM HCL-DEXTROSE 125-5 MG/125ML-% IV SOLN (PREMIX)
5.0000 mg/h | INTRAVENOUS | Status: DC
  Administered 2019-10-09: 5 mg/h via INTRAVENOUS
  Filled 2019-10-09: qty 125

## 2019-10-09 MED ORDER — DOCUSATE SODIUM 100 MG PO CAPS
100.0000 mg | ORAL_CAPSULE | Freq: Every day | ORAL | Status: DC
  Administered 2019-10-09 – 2019-10-17 (×9): 100 mg via ORAL
  Filled 2019-10-09 (×9): qty 1

## 2019-10-09 MED ORDER — VANCOMYCIN HCL IN DEXTROSE 1-5 GM/200ML-% IV SOLN: 1000.0000 mg | INTRAVENOUS | Status: DC

## 2019-10-09 MED ORDER — MIDODRINE HCL 5 MG PO TABS
5.0000 mg | ORAL_TABLET | Freq: Three times a day (TID) | ORAL | Status: DC
  Administered 2019-10-09 – 2019-10-11 (×5): 5 mg via ORAL
  Filled 2019-10-09 (×7): qty 1

## 2019-10-09 NOTE — ED Provider Notes (Signed)
Toni [name]   CSN: [id number] Arrival date & time: 10/09/19  1508     History No chief complaint on file.   Toni [name] is a 74 y.o. female.  HPI   Patient presents with dyspnea, concern for hypertension.  Patient has multiple medical issues including A. fib, had coronavirus 1 month ago.  She was in rehabilitation until 2 days ago, and now since arriving home she has had worsening dyspnea. She has also had worsening blood pressure, but denies fever, syncope, vomiting.  She also describes chest tightness, though only after initial questions. Notably, the patient was hospitalized 1 month ago, had adjustment of her hypertension and A. fib medication regimen.  Yesterday with dyspnea, concern for hypertension spoke with her cardiology team, and they adjusted medication.  Line in spite of taking her changed dose today she notes that she feels persistently short of breath and blood pressure has increased. At home she had pulse oximetry readings below 90%, she does not wear oxygen.  EMS reports that the patient did not improve with nasal cannula, but did with a nonrebreather mask to 90%.  Past Medical History:  Diagnosis Date  . Abnormality of gait 03/01/2015  . Anxiety   . Arthritis    "knees" (01/19/2016)  . Atrial fibrillation with RVR ([place]) 01/19/2016  . Back pain   . Chest pain   . Childhood asthma   . Chronic pain    "nerve pain from the MS"  . Constipation   . Depression   . DVT (deep venous thrombosis) ([place]) 1983   "RLE; may have just been phlebitis; it was before the age of dopplers"  . 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 GERD (gastroesophageal reflux disease)   . History of stress test 06/21/10   limited exam with some degree of breast attenuatin of the apex however a component of apical ischemia is not excluded, EF 62%; cardiac cath   . HLD (hyperlipidemia)   . Hx  of echocardiogram 04/22/10   EF 55-60%, no valve issues  . Hypertension   . Hypothyroidism   . Joint pain   . Migraine    "none since I stopped beta blocker in 1990s" (01/19/2016)  . Multiple sclerosis ([place])    has had this may 1989-[place] reg doc  . OSA on CPAP   . Osteoarthritis   . PAF (paroxysmal atrial fibrillation) ([place])    on coumadin; documented on monitor 06/2010  . Pneumonia 11/2011  . Shortness of breath   . Thyroid disease     Patient Active Problem List   Diagnosis Date Noted  . COVID-19 virus infection 08/23/2019  . Constipation   . Bilateral leg weakness 08/04/2019  . Weakness 08/04/2019  . Unilateral primary osteoarthritis, left knee 01/09/2019  . Type 2 diabetes mellitus without complication, without long-term current use of insulin ([place]) 08/27/2018  . Obstructive sleep apnea treated with bilevel positive airway pressure (BiPAP) 08/15/2018  . Sleep apnea treated with nocturnal BiPAP 04/23/2018  . 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 ([place]) 11/21/2017  . Permanent atrial fibrillation ([place]) 11/21/2017  . Tightness of leg fascia 11/21/2017  . Muscle spasm of both lower legs 11/21/2017  . Carpal tunnel syndrome, left upper limb 09/13/2017  . Carpal tunnel syndrome, right upper limb 09/13/2017  . Other insomnia 06/11/2017  . Depression 06/11/2017  . Abdominal swelling 03/29/2017  .  Central apnea 03/26/2017  . Class 3 obesity with serious comorbidity and body mass index (BMI) of 40.0 to 44.9 in adult 03/22/2017  . Vitamin D deficiency 03/22/2017  . Prediabetes 03/22/2017  . Venous insufficiency 02/14/2017  . Right hand weakness 08/06/2016  . Nonischemic cardiomyopathy ([place]) 05/09/2016  . Localized edema 05/09/2016  . Long term current use of anticoagulant therapy 02/03/2016  . 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 Atrial fibrillation with RVR ([place]) 01/19/2016  . 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 Obesity hypoventilation syndrome ([place]) 03/30/2015  . Abnormality of gait 03/01/2015  . Chronic constipation with overflow incontinence 01/25/2015  . 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) ([place]) 01/25/2015  . 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 Closed right ankle fracture 10/30/2013  . Leucocytosis 10/30/2013  . Varicose veins of both lower extremities with complications 0000000  . Muscle spasms of lower extremity 02/24/2013  . Long term (current) use of anticoagulants 11/05/2012  . 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 Aspiration pneumonia vs Failed outpatient Rx of CAP 11/26/2011  . Essential hypertension   . Thyroid disease   . HLD (hyperlipidemia)     Past Surgical History:  Procedure Laterality Date  . ANTERIOR CERVICAL DECOMP/DISCECTOMY FUSION  01/2004  . BACK SURGERY    . CARDIAC CATHETERIZATION  06/22/2010   normal coronary arteries, PAF  . CATARACT EXTRACTION, BILATERAL Bilateral   . CHILD BIRTHS     X2  . DILATION AND CURETTAGE OF UTERUS    . INFUSION PUMP IMPLANTATION  ~ 2009   baclofen infusion in lower abd  . PAIN PUMP REVISION N/A 07/29/2014   Procedure: Baclofen pump replacement;  Surgeon: Erline Levine, MD;  Location: [place] NEURO ORS;  Service: Neurosurgery;  Laterality: N/A;  Baclofen pump replacement  . TONSILLECTOMY AND ADENOIDECTOMY    . VARICOSE VEIN SURGERY  ~ 1968  . VENOUS ABLATION  12/16/2010   radiofreq ablation -Dr Elisabeth Cara [facility]  . WISDOM TOOTH EXTRACTION       OB History    Gravida  2   Para      Term      Preterm      AB      Living        SAB      TAB      Ectopic      Multiple      Live Births              Family History  Problem Relation Age of Onset  . Coronary artery disease Father        at age 48  . Hyperlipidemia Father   . Thyroid disease  Father   . Coronary artery disease Maternal Grandmother   . Depression Maternal Grandmother   . Cancer Paternal Grandmother   . Depression Mother   . Sudden death Mother   . Anxiety disorder Mother   . Alcoholism Son     Social History   Tobacco Use  . Smoking status: Former Smoker    Packs/day: 0.50    Years: 7.00    Pack years: 3.50    Types: Cigarettes    Quit date: 08/21/1976    Years since quitting: 43.1  . Smokeless tobacco: Never Used  Substance Use Topics  . Alcohol use: Yes    Alcohol/week: 1.0 standard drinks  Types: 1 Glasses of wine per week  . Drug use: No    Home Medications Prior to Admission medications   Medication Sig Start Date End Date Taking? Authorizing Provider  acetaminophen (TYLENOL) 500 MG tablet Take 500-1,000 mg by mouth every 6 (six) hours as needed for fever or headache (or pain).    [provider]  ALPRAZolam Duanne Moron) 0.5 MG tablet Take 1 tablet (0.5 mg total) by mouth at bedtime as needed for anxiety. 08/29/19   Arma Heading, MD  Ascorbic Acid (VITAMIN C) 500 MG CAPS Take 500 mg by mouth daily.    [provider]  baclofen (LIORESAL) 20 MG tablet Once to two tablets a day for breakthrough spasms Patient taking differently: Take 20 mg by mouth 2 (two) times daily as needed for muscle spasms.  08/27/18   Dohmeier, Asencion Partridge, MD  buPROPion (WELLBUTRIN XL) 300 MG 24 hr tablet Take 1 tablet (300 mg total) by mouth daily. 08/23/17   Dennard Nip D, MD  Calcium Carbonate-Vit D-Min (CALTRATE 600+D PLUS MINERALS) 600-800 MG-UNIT TABS Take 2 tablets by mouth 2 (two) times daily.    [provider]  cetirizine (ZYRTEC) 10 MG tablet Take 10 mg by mouth daily as needed for allergies.     [provider]  Cholecalciferol (VITAMIN D) 2000 units tablet Take 1 tablet (2,000 Units total) by mouth 2 (two) times daily. 03/22/17   Dennard Nip D, MD  corticotropin (ACTHAR) 80 UNIT/ML injectable gel INJECT 80 UNITS (1ML) DAILY FOR 10  DAYS. DUE TO MS EXACERBATION AND THEN CAN RESUME NEXT MONTH AT PREVIOUS DOSE. 06/18/19   Dohmeier, Asencion Partridge, MD  Cranberry 1000 MG CAPS Take 2 capsules by mouth 2 (two) times daily.     [provider]  docusate sodium (COLACE) 100 MG capsule Take 1 capsule (100 mg total) by mouth daily. 08/12/16   Rai, Vernelle Emerald, MD  famotidine (PEPCID) 20 MG tablet Take 20 mg by mouth at bedtime.  06/11/18   [provider]  fluticasone (FLONASE) 50 MCG/ACT nasal spray Place 2 sprays into both nostrils daily as needed for allergies.  03/23/15   [provider]  furosemide (LASIX) 20 MG tablet Take 1 tablet by mouth daily, may take second dose if needed for excess edema 09/30/19   Pixie Casino, MD  gabapentin (NEURONTIN) 100 MG capsule Take 200 mg by mouth at bedtime. Given with gabapentin 300mg  qhs for a total dose of 500 mg at bedtime    [provider]  gabapentin (NEURONTIN) 300 MG capsule TAKE 1 CAPSULE BY MOUTH UP  TO 4 TIMES DAILY Patient taking differently: Take 300 mg by mouth 3 (three) times daily.  12/31/18   Dohmeier, Asencion Partridge, MD  GENERLAC 10 GM/15ML SOLN TAKE 15 ML BY MOUTH TWICE DAILY AS NEEDED Patient taking differently: Take 30 g by mouth daily as needed (constipation).  06/25/19   Dohmeier, Asencion Partridge, MD  HYDROcodone-acetaminophen (NORCO/VICODIN) 5-325 MG tablet Take 1 tablet by mouth every 6 (six) hours as needed for up to 30 doses for moderate pain or severe pain. 08/29/19   Arma Heading, MD  levothyroxine (SYNTHROID, LEVOTHROID) 75 MCG tablet Take 75 mcg by mouth daily.    [provider]  Melatonin 3 MG TABS Take 1 tablet (3 mg total) by mouth at bedtime as needed (sleep). 08/08/19   Donne Hazel, MD  metoprolol tartrate (LOPRESSOR) 25 MG tablet Take 0.5 tablets (12.5 mg total) by mouth 2 (two) times daily. 09/30/19  Pixie Casino, MD  midodrine (PROAMATINE) 5 MG tablet Take 1 tablet (5 mg total) by mouth 3 (three) times daily with meals. 09/30/19    Hilty, Nadean Corwin, MD  omeprazole (PRILOSEC) 20 MG capsule Take 20 mg by mouth daily.     [provider]  ondansetron (ZOFRAN) 4 MG tablet Take 4 mg by mouth every 8 (eight) hours as needed for nausea or vomiting.    [provider]  promethazine (PHENERGAN) 25 MG suppository Place 25 mg rectally every 6 (six) hours as needed for nausea or vomiting.    [provider]  psyllium (METAMUCIL SMOOTH TEXTURE) 28 % packet Take 1 packet by mouth daily.     [provider]  simvastatin (ZOCOR) 10 MG tablet Take 10 mg by mouth every evening.  03/01/18   [provider]  sodium chloride 1 g tablet Take 1 tablet (1 g total) by mouth 2 (two) times daily with a meal. 08/29/19   Arma Heading, MD  SYRINGE/NEEDLE, DISP, 1 ML (BD LUER-LOK SYRINGE) 25G X 5/8" 1 ML MISC Use as directed to inject  Acthar 06/15/15   Dohmeier, Asencion Partridge, MD  Tamsulosin HCl (FLOMAX) 0.4 MG CAPS Take 0.4 mg by mouth daily.    [provider]  warfarin (COUMADIN) 5 MG tablet Take 1/2 to 1 tablet by mouth daily as directed by coumadin clinic. 09/24/19   Hilty, Nadean Corwin, MD  zinc sulfate 220 (50 Zn) MG capsule Take 1 capsule (220 mg total) by mouth daily. 08/30/19   Arma Heading, MD    Allergies    Hydrocodone, Oxycodone, Chlorhexidine gluconate, and Zoloft [sertraline]  Review of Systems   Review of Systems  Constitutional:       Per HPI, otherwise negative  HENT:       Per HPI, otherwise negative  Respiratory:       Per HPI, otherwise negative  Cardiovascular:       Per HPI, otherwise negative  Gastrointestinal: Negative for vomiting.  Endocrine:       Negative aside from HPI  Genitourinary:       Neg aside from HPI   Musculoskeletal:       Per HPI, otherwise negative  Skin: Negative.   Neurological: Positive for weakness and light-headedness. Negative for syncope.    Physical Exam Updated Vital Signs BP 126/77   Pulse (!) 127   Temp 99.8 F (37.7 C)   Resp 16    Ht 5\' 3"  (1.6 m)   Wt 81.2 kg   SpO2 98%   BMI 31.71 kg/m   Physical Exam Vitals and nursing [name] reviewed.  Constitutional:      General: She is not in acute distress.    Appearance: She is well-developed.  HENT:     Head: Normocephalic and atraumatic.  Eyes:     Conjunctiva/sclera: Conjunctivae normal.  Cardiovascular:     Rate and Rhythm: Regular rhythm. Tachycardia present.  Pulmonary:     Effort: Pulmonary effort is normal. No respiratory distress.     Breath sounds: Decreased air movement present.     Comments: Nonrebreather in place, 98% with supplemental oxygen. Abdominal:     General: There is no distension.  Skin:    General: Skin is warm and dry.  Neurological:     Mental Status: She is alert and oriented to person, place, and time.     Cranial Nerves: No cranial nerve deficit.     ED Results / Procedures /  Treatments   Labs (all labs ordered are listed, but only abnormal results are displayed) Labs Reviewed  PROTIME-INR - Abnormal; Notable for the following components:      Result Value   Prothrombin Time 27.9 (*)    INR 2.6 (*)    All other components within normal limits  COMPREHENSIVE METABOLIC PANEL - Abnormal; Notable for the following components:   CO2 33 (*)    Total Protein 6.4 (*)    All other components within normal limits  CBC WITH DIFFERENTIAL/PLATELET - Abnormal; Notable for the following components:   WBC 13.4 (*)    Neutro Abs 9.9 (*)    Monocytes Absolute 1.7 (*)    All other components within normal limits  URINALYSIS, ROUTINE W REFLEX MICROSCOPIC - Abnormal; Notable for the following components:   Color, Urine AMBER (*)    Ketones, ur 20 (*)    All other components within normal limits  BRAIN NATRIURETIC PEPTIDE    EKG EKG Interpretation  Date/Time:  Thursday October 09 2019 15:56:26 EST Ventricular Rate:  135 PR Interval:    QRS Duration: 93 QT Interval:  302 QTC Calculation: 453 R Axis:   94 Text  Interpretation: Atrial fibrillation Right axis deviation Abnormal ECG Confirmed by Carmin Muskrat [id number]) on 10/09/2019 4:07:39 PM   Radiology DG Chest Port 1 View  Result Date: 10/09/2019 CLINICAL DATA:  Shortness of breath. EXAM: PORTABLE CHEST 1 VIEW COMPARISON:  08/22/2019. FINDINGS: Cardiomegaly. Diffuse bilateral pulmonary infiltrates/edema, right side greater than left. Kerley B-lines noted. These findings suggest CHF. Pneumonia cannot be excluded. No prominent pleural effusion. No pneumothorax. Degenerative changes and scoliosis thoracic spine. IMPRESSION: Cardiomegaly with diffuse bilateral pulmonary infiltrates/edema, right side greater than left. Findings suggest CHF. Bilateral pneumonia cannot be excluded. Electronically Signed   By: Marcello Moores  Register   On: 10/09/2019 15:43    Procedures Procedures (including critical care time)  CRITICAL CARE Performed by: Carmin Muskrat Total critical care time: 35 minutes Critical care time was exclusive of separately billable procedures and treating other patients. Critical care was necessary to treat or prevent imminent or life-threatening deterioration. Critical care was time spent personally by me on the following activities: development of treatment plan with patient and/or surrogate as well as nursing, discussions with consultants, evaluation of patient's response to treatment, examination of patient, obtaining history from patient or surrogate, ordering and performing treatments and interventions, ordering and review of laboratory studies, ordering and review of radiographic studies, pulse oximetry and re-evaluation of patient's condition.   Medications Ordered in ED Medications  vancomycin (VANCOREADY) IVPB 1500 mg/300 mL (1,500 mg Intravenous New Bag/Given 10/09/19 1721)  acetaminophen (TYLENOL) tablet 500 mg (500 mg Oral Given 10/09/19 1644)  ceFEPIme (MAXIPIME) 2 g in sodium chloride 0.9 % 100 mL IVPB (0 g Intravenous Stopped 10/09/19  1719)    ED Course  I have reviewed the triage vital signs and the nursing notes.  Pertinent labs & imaging results that were available during my care of the patient were reviewed by me and considered in my medical decision making (see chart for details).    MDM Rules/Calculators/A&P                      5:42 PM  Patient now on 4 L via nasal cannula, maintains appropriate saturation for We have discussed initial findings, including x-ray which suggests pneumonia. With new oxygen requirement she will require admission. She has persistent tachycardia, but no ongoing pain.  Patient's new oxygen  requirement likely secondary to multifactorial etiology with concern for pneumonia, A. fib, heart failure.  After initial antibiotics patient received Lasix and is starting a Cardizem drip.  This elderly female with multiple medical issues including recent coronavirus infection presents with new fatigue, dyspnea, and is found to have new oxygen requirement. Patient is awake and alert, afebrile, but has leukocytosis and x-ray concerning for effusion versus pneumonia.  Given recent hospitalization did she did receive antibiotics, but with consideration of mixed etiology also received Lasix and Cardizem for persistent A. fib.  Given this combination the patient will require admission for further monitoring, management   Final Clinical Impression(s) / ED Diagnoses Final diagnoses:  HCAP (healthcare-associated pneumonia)  Paroxysmal atrial fibrillation (HCC)     Carmin Muskrat, MD 10/09/19 [phone]

## 2019-10-09 NOTE — Progress Notes (Signed)
[facility] for warfarin Indication: hx atrial fibrillation  Allergies  Allergen Reactions  . Hydrocodone Nausea And Vomiting    Hydrocodone causes vomiting  . Oxycodone Nausea And Vomiting    Oral oxycodone causes vomiting  . Chlorhexidine Gluconate Rash    Sores at site  . Zoloft [Sertraline] Hives, Swelling and Rash    Patient Measurements: Height: 5\' 3"  (160 cm) Weight: 179 lb (81.2 kg) IBW/kg (Calculated) : 52.4 Heparin Dosing Weight:   Vital Signs: Temp: 99.8 F (37.7 C) (02/18 1530) BP: 118/72 (02/18 1945) Pulse Rate: 91 (02/18 1945)  Labs: Recent Labs    10/08/19 0000 10/09/19 1557  HGB  --  14.3  HCT  --  44.0  PLT  --  197  LABPROT  --  27.9*  INR 2.9 2.6*  CREATININE  --  0.44    Estimated Creatinine Clearance: 64.1 mL/min (by C-G formula based on SCr of 0.44 mg/dL).   Medications:  PTA regimen: 5mg  daily except 2.5 mg on Mondays and Wednesdays--> per [facility] clinic visit on 10/08/2019.  However, patient said that since she got discharged from rehab facility (10/07/19), she has been using their medication dose packs with a warfarin regimen of 4mg  daily except 4.5 mg on Mon and Fri  Assessment: Patient is a 74 y.o F with hx afib on warfarin on admission presented to the ED on 2/18 with SOB.  Warfarin resumed on admission.  Today, 10/09/2019: - INR is therapeutic at 2.6 - cbc stable - drug-drug intxns: being on abx can make pt more sensitive to warfarin  Goal of Therapy:  INR 2-3 Monitor platelets by anticoagulation protocol: Yes   Plan:  - warfarin 5 mg PO x1 today - daily INR - monitor for s/s bleeding  Lynelle Doctor 10/09/2019,8:05 PM

## 2019-10-09 NOTE — Progress Notes (Signed)
A consult was received from an ED physician for Vancomycin per pharmacy dosing.  The patient's profile has been reviewed for ht/wt/allergies/indication/available labs.   A one time order has been placed for Vancomycin 1500mg  IV.  Further antibiotics/pharmacy consults should be ordered by admitting physician if indicated.                       Thank you, Netta Cedars, PharmD 10/09/2019  4:47 PM

## 2019-10-09 NOTE — Telephone Encounter (Signed)
Spoke with patient.  She notes that for the past 24 hours her HR has been 88-148 with BP increasing, especially diastolic (was [id number] at 11 am then 139/105 at 1 pm).  O2 is in the low 90's.    Advised patient that she needs to go to ED for evaluation.  She voiced understanding and will call EMS

## 2019-10-09 NOTE — Telephone Encounter (Signed)
Patient called to report elevated BP in 140s prior to taking midodrine dose toda and pulse in the 90s.  Current regimen: Midodrine 5mg  TID (meal times) Metoprolol 12.5mg  BID  Recommendation: 1. HOLD morning dose of midodrine , then take 2.5mg  (1/2 tablet) twice daily  2. Increase metoprolol to 25mg  in AM and 12.5mg  in PM  3 monitor BP 2-3 times per day and keep records  4. Call back tomorrow 2/18 with BP readings for further assessment. [facility] nurse to assess today 2/17 and draw INR)

## 2019-10-09 NOTE — Progress Notes (Signed)
Pharmacy Antibiotic Note  Toni [name] is a 74 y.o. female with recent hx COVID in January 2021 presented to the ED on 10/09/2019 with c/o SOB.  Pharmacy is consulted to dose vancomycin and cefepime for suspected PNA.  Plan: - vancomycin 1500 mg IV x1, then 1000 mg q24h for ext AUC 514 - cefepime 2gm IV q8h  _____________________________________  Height: 5\' 3"  (160 cm) Weight: 179 lb (81.2 kg) IBW/kg (Calculated) : 52.4  Temp (24hrs), Avg:99.8 F (37.7 C), Min:99.8 F (37.7 C), Max:99.8 F (37.7 C)  Recent Labs  Lab 10/09/19 1557  WBC 13.4*  CREATININE 0.44    Estimated Creatinine Clearance: 64.1 mL/min (by C-G formula based on SCr of 0.44 mg/dL).    Allergies  Allergen Reactions  . Hydrocodone Nausea And Vomiting    Hydrocodone causes vomiting  . Oxycodone Nausea And Vomiting    Oral oxycodone causes vomiting  . Chlorhexidine Gluconate Rash    Sores at site  . Zoloft [Sertraline] Hives, Swelling and Rash     Thank you for allowing pharmacy to be a part of this patient's care.  Lynelle Doctor 10/09/2019 7:57 PM

## 2019-10-09 NOTE — Progress Notes (Signed)
As it has been greater than 21 days but within 3 months since the patient's positive COVID-19 test on 08/22/19, with interval improvement in initial symptoms, there is currently no indication to repeat COVID-19 testing or to maintain Airborne/Contact precautions, and I will be admitting this patient to a non-COVID unit.    Babs Bertin, DO Hospitalist

## 2019-10-09 NOTE — ED Triage Notes (Signed)
Per EMS, Pt is coming from is coming from home. HX of MS, was seen beginning of Jan for covid. Pt called out today for SOB, 85% RA on arrival. Diminished sounds in lower bilateral lobes. Pt states "unable to take a deep breath". 96% o2 while wearing non-rebreather at 10L.

## 2019-10-09 NOTE — H&P (Signed)
History and Physical    PLEASE NOTE THAT [employer]AGON DICT[employer] SOFTWARE WAS USED IN THE CONSTRUCTION OF THIS NOTE.   Toni [name] QMG:[id number] DOB: 10-22-1945 DOA: 10/09/2019  PCP: Prince Solian, MD Patient coming from: home   I have personally briefly reviewed patient's old medical records in [place]  Chief Complaint: shortness of breath  HPI: Toni [name] is a 74 y.o. female with medical history significant for paroxysmal atrial fibrillation chronically anticoagulated on Warfarin, OSA on nocturnal BiPAP, hypothyroidism, multiple sclerosis, who is admitted to [facility] on 10/09/2019 with acute hypoxic respiratory failure in the setting of suspected severe sepsis due to HCAP pna after presenting from home to [facility] Emergency Department complaining of shortness.   The patient was recently hospitalized at [facility] from 08/22/19 through 08/29/19 for further evaluation and management of COVID-19 infection per positive rapid COVID-19 antigen test performed on 08/22/19, with presentation complicated by hypotension and UTI. It appears that she exhibited persistent hypotension with symptomatic orthostatic hypotension throughout this previous hospitalization resulting in multiple changes to her outpatient medication regimen, including initiation of of midodrine and salt tabs, as well as discontinuation of prior Bystolic and Diltiazem, which were replaced with metoprolol tartrate 12.5 mg PO BID. Please see discharge summary from this most recent hospitalization for additional details.   On 08/29/19 the patient was discharge to temporary rehab at SNF at which time was experiencing no residual respiratory symptoms, and was subsequently discharged from SNF to home on 10/07/19. Over the last 2 days she reports development of shortness of breath a/w subjective fever. She reports that her baseline temperature for several years has been 97.5, and that any temperature higher than this is  very unusual for her. She denies any associated orthopnea, PND, worsening of peripheral edema, or any known increase in weight. Denies any associated cough, chills, rigors, or generalized myalgias. Denies any recent headache, neck stiffness, rhinitis, rhinorrhea, sore throat, abdominal pain, diarrhea, or rash. She reports mild nausea over the last several weeks, but denies any associated vomiting. Denies dysuria, gross hematuria, or change in urinary urgency/frequency. She also denies any recent chest pain, palpitations, diaphoresis, or wheezing.   The patient reports good compliance with her outpatient metoprolol tartrate 12.5 mg PO BID as well as Lasix 20 mg PO Qdaily, with most recent dose of the latter occurring on the morning of 10/09/19. Denies any baseline supplemental O2 requirements.   In the setting of progressive SOB, patient contact EMS, who, upon arrival, noted the patient to be hypoxic with O2 sats in the mid 80's on RA. She was started on 10 L non-rebreather, with ensuing increase in O2 sats to the high 90's     ED Course:  Vital signs in the ED were notable for the following: Tmax 99.8; HR 76-147; BP 111/79 - 130/82; RR 18-22; initial O2 sat was 98% on 10 L non-rebreather, which was subsequently reduced to 4 L [place], with result O2 sat of 97%.   Labs were notable for the following: CMP notable for the following: Na 142, potassium 3.8, Cr 0.44 relative to 0.43 on 08/26/19. BNP 381 relative to most recent prior value of 82 on 08/22/19. CBC notable for wbc of 13,400 with 75% neutrophils, hgb 14.3; INR 2.6. UA showed no wbc's and was LE and nitrite negative.   CXR, relative to CXR from 08/22/19, showed interval development of bilateral airspace opacities concerning for infiltrates, right greater than left. EKG showed A fib with RVR and  ventricular rate of 135; with T-wave inversion in V1, which was unchanged from EKG performe don 08/27/19, and no evidence of ST changes, including no evidence of ST  elevation.  While in the ED, the following were administered: tylenol 500 mg PO x 1, Lasix 40 mg IV x 1, Cefepime 2 g IV x 1, dose of IV Vanc, and was started on Diltiazem drip. The patient was was subsequently admitted to the stepdown unit at [facility] for further evaluation and management of acute hypoxic respiratory failure in the setting of severe sepsis due to suspected HCAP pna as well as additional evaluation of Atrial fibrillation with RVR.     Review of Systems: As per HPI otherwise 10 point review of systems negative.   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' ablation of R GSV 2012   GERD (gastroesophageal reflux disease)    History of stress test 06/21/10   limited exam with some degree of breast attenuatin of the apex however a component of apical ischemia is not excluded, EF 62%; cardiac cath    HLD (hyperlipidemia)    Hx of echocardiogram 04/22/10   EF 55-60%, no valve issues   Hypertension    Hypothyroidism    Joint pain    Migraine    "none since I stopped beta blocker in 1990s" (01/19/2016)   Multiple sclerosis ([place])    has had this may 1989-[facility] reg doc   OSA on CPAP    Osteoarthritis    PAF (paroxysmal atrial fibrillation) ([place])    on coumadin; documented on monitor 06/2010   Pneumonia 11/2011   Shortness of breath    Thyroid disease     Past Surgical History:  Procedure Laterality Date   ANTERIOR CERVICAL DECOMP/DISCECTOMY FUSION  01/2004   BACK SURGERY     CARDIAC CATHETERIZATION  06/22/2010   normal  coronary arteries, PAF   CATARACT EXTRACTION, BILATERAL Bilateral    CHILD BIRTHS     X2   DILATION AND CURETTAGE OF UTERUS     INFUSION PUMP IMPLANTATION  ~ 2009   baclofen infusion in lower abd   PAIN PUMP REVISION N/A 07/29/2014   Procedure: Baclofen pump replacement;  Surgeon: Erline Levine, MD;  Location: [place] [facility] OR[facility];  Service: Neurosurgery;  Laterality: N/A;  Baclofen pump replacement   TONSILLECTOMY AND ADENOIDECTOMY     VARICOSE VEIN SURGERY  ~ 1968   VENOUS ABLATION  12/16/2010   radiofreq ablation -Dr Elisabeth Cara and Hilty   WISDOM TOOTH EXTRACTION      Social History:  reports that she quit smoking about 43 years ago. Her smoking use included cigarettes. She has a 3.50 pack-year smoking history. She has never used smokeless tobacco. She reports current alcohol use of about 1.0 standard drinks of alcohol per week. She reports that she does not use drugs.   Allergies  Allergen Reactions   Hydrocodone Nausea And Vomiting  Hydrocodone causes vomiting   Oxycodone Nausea And Vomiting    Oral oxycodone causes vomiting   Chlorhexidine Gluconate Rash    Sores at site   Zoloft [Sertraline] Hives, Swelling and Rash    Family History  Problem Relation Age of Onset   Coronary artery disease Father        at age 64   Hyperlipidemia Father    Thyroid disease Father    Coronary artery disease Maternal Grandmother    Depression Maternal Grandmother    Cancer Paternal Grandmother    Depression Mother    Sudden death Mother    Anxiety disorder Mother    Alcoholism Son       Prior to Admission medications   Medication Sig Start Date End Date Taking? Authorizing Provider  acetaminophen (TYLENOL) 500 MG tablet Take 500-1,000 mg by mouth every 6 (six) hours as needed for fever or headache (or pain).    [provider]  ALPRAZolam Duanne Moron) 0.5 MG tablet Take 1 tablet (0.5 mg total) by mouth at bedtime as needed for anxiety. 08/29/19   Arma Heading, MD   Ascorbic Acid (VITAMIN C) 500 MG CAPS Take 500 mg by mouth daily.    [provider]  baclofen (LIORESAL) 20 MG tablet Once to two tablets a day for breakthrough spasms Patient taking differently: Take 20 mg by mouth 2 (two) times daily as needed for muscle spasms.  08/27/18   Dohmeier, Asencion Partridge, MD  buPROPion (WELLBUTRIN XL) 300 MG 24 hr tablet Take 1 tablet (300 mg total) by mouth daily. 08/23/17   Dennard Nip D, MD  Calcium Carbonate-Vit D-Min (CALTRATE 600+D PLUS MINERALS) 600-800 MG-UNIT TABS Take 2 tablets by mouth 2 (two) times daily.    [provider]  cetirizine (ZYRTEC) 10 MG tablet Take 10 mg by mouth daily as needed for allergies.     [provider]  Cholecalciferol (VITAMIN D) 2000 units tablet Take 1 tablet (2,000 Units total) by mouth 2 (two) times daily. 03/22/17   Dennard Nip D, MD  corticotropin (ACTHAR) 80 UNIT/ML injectable gel INJECT 80 UNITS (1ML) DAILY FOR 10 DAYS. DUE TO MS EXACERBATION AND THEN CAN RESUME NEXT MONTH AT PREVIOUS DOSE. 06/18/19   Dohmeier, Asencion Partridge, MD  Cranberry 1000 MG CAPS Take 2 capsules by mouth 2 (two) times daily.     [provider]  docusate sodium (COLACE) 100 MG capsule Take 1 capsule (100 mg total) by mouth daily. 08/12/16   Rai, Vernelle Emerald, MD  famotidine (PEPCID) 20 MG tablet Take 20 mg by mouth at bedtime.  06/11/18   [provider]  fluticasone (FLONASE) 50 MCG/ACT nasal spray Place 2 sprays into both nostrils daily as needed for allergies.  03/23/15   [provider]  furosemide (LASIX) 20 MG tablet Take 1 tablet by mouth daily, may take second dose if needed for excess edema 09/30/19   Pixie Casino, MD  gabapentin (NEURONTIN) 100 MG capsule Take 200 mg by mouth at bedtime. Given with gabapentin 392m qhs for a total dose of 500 mg at bedtime    [provider]  gabapentin (NEURONTIN) 300 MG capsule TAKE 1 CAPSULE BY MOUTH UP  TO 4 TIMES DAILY Patient taking differently: Take 300 mg  by mouth 3 (three) times daily.  12/31/18   Dohmeier, CAsencion Partridge MD  GENERLAC 10 GM/15ML SOLN TAKE 15 ML BY MOUTH TWICE DAILY AS NEEDED Patient taking differently: Take 30 g by mouth daily as needed (constipation).  06/25/19  Dohmeier, Asencion Partridge, MD  HYDROcodone-acetaminophen (NORCO/VICODIN) 5-325 MG tablet Take 1 tablet by mouth every 6 (six) hours as needed for up to 30 doses for moderate pain or severe pain. 08/29/19   Arma Heading, MD  levothyroxine (SYNTHROID, LEVOTHROID) 75 MCG tablet Take 75 mcg by mouth daily.    [provider]  Melatonin 3 MG TABS Take 1 tablet (3 mg total) by mouth at bedtime as needed (sleep). 08/08/19   Donne Hazel, MD  metoprolol tartrate (LOPRESSOR) 25 MG tablet Take 0.5 tablets (12.5 mg total) by mouth 2 (two) times daily. 09/30/19   Hilty, Nadean Corwin, MD  midodrine (PROAMATINE) 5 MG tablet Take 1 tablet (5 mg total) by mouth 3 (three) times daily with meals. 09/30/19   Hilty, Nadean Corwin, MD  omeprazole (PRILOSEC) 20 MG capsule Take 20 mg by mouth daily.     [provider]  ondansetron (ZOFRAN) 4 MG tablet Take 4 mg by mouth every 8 (eight) hours as needed for nausea or vomiting.    [provider]  promethazine (PHENERGAN) 25 MG suppository Place 25 mg rectally every 6 (six) hours as needed for nausea or vomiting.    [provider]  psyllium (METAMUCIL SMOOTH TEXTURE) 28 % packet Take 1 packet by mouth daily.     [provider]  simvastatin (ZOCOR) 10 MG tablet Take 10 mg by mouth every evening.  03/01/18   [provider]  sodium chloride 1 g tablet Take 1 tablet (1 g total) by mouth 2 (two) times daily with a meal. 08/29/19   Arma Heading, MD  SYRINGE/NEEDLE, DISP, 1 ML (BD LUER-LOK SYRINGE) 25G X 5/8" 1 ML MISC Use as directed to inject  Acthar 06/15/15   Dohmeier, Asencion Partridge, MD  Tamsulosin HCl (FLOMAX) 0.4 MG CAPS Take 0.4 mg by mouth daily.    [provider]  warfarin (COUMADIN) 5 MG tablet Take 1/2 to 1  tablet by mouth daily as directed by coumadin clinic. 09/24/19   Hilty, Nadean Corwin, MD  zinc sulfate 220 (50 Zn) MG capsule Take 1 capsule (220 mg total) by mouth daily. 08/30/19   Arma Heading, MD     Objective    Physical Exam: Vitals:   10/09/19 1531 10/09/19 1652 10/09/19 1741 10/09/19 1800  BP:  126/77 129/70 114/88  Pulse:  (!) 127 76 97  Resp:  '16 17 17  ' Temp:      SpO2:  98% 99% 97%  Weight: 81.2 kg     Height: '5\' 3"'  (1.6 m)       General: appears to be stated age; alert, oriented Skin: warm, dry, no rash Head:  AT/[place] Eyes:  PEARL b/l, EOMI Mouth:  Oral mucosa membranes appear moist, normal dentition Neck: supple; trachea midline Heart:  Tachycardic, irregular; did not appreciate any M/R/G Lungs: b/l rates noted in the absence of any rhonchi or wheezes.  Abdomen: + BS; soft, ND, NT Extremities: trace edema in b/l LE's; no erythema.   Labs on Admission: I have personally reviewed following labs and imaging studies  CBC: Recent Labs  Lab 10/09/19 1557  WBC 13.4*  NEUTROABS 9.9*  HGB 14.3  HCT 44.0  MCV 95.7  PLT 696   Basic Metabolic Panel: Recent Labs  Lab 10/09/19 1557  NA 142  K 3.8  CL 100  CO2 33*  GLUCOSE 92  BUN 19  CREATININE 0.44  CALCIUM 8.9   GFR: Estimated Creatinine Clearance: 64.1 mL/min (by C-G formula  based on SCr of 0.44 mg/dL). Liver Function Tests: Recent Labs  Lab 10/09/19 1557  AST 15  ALT 21  ALKPHOS 59  BILITOT 0.9  PROT 6.4*  ALBUMIN 3.6   No results for input(s): LIPASE, AMYLASE in the last 168 hours. No results for input(s): AMMONIA in the last 168 hours. Coagulation Profile: Recent Labs  Lab 10/08/19 0000 10/09/19 1557  INR 2.9 2.6*   Cardiac Enzymes: No results for input(s): CKTOTAL, CKMB, CKMBINDEX, TROPONINI in the last 168 hours. BNP (last 3 results) No results for input(s): PROBNP in the last 8760 hours. HbA1C: No results for input(s): HGBA1C in the last 72 hours. CBG: No results for input(s):  GLUCAP in the last 168 hours. Lipid Profile: No results for input(s): CHOL, HDL, LDLCALC, TRIG, CHOLHDL, LDLDIRECT in the last 72 hours. Thyroid Function Tests: No results for input(s): TSH, T4TOTAL, FREET4, T3FREE, THYROIDAB in the last 72 hours. Anemia Panel: No results for input(s): VITAMINB12, FOLATE, FERRITIN, TIBC, IRON, RETICCTPCT in the last 72 hours. Urine analysis:    Component Value Date/Time   COLORURINE AMBER (A) 10/09/2019 1722   APPEARANCEUR CLEAR 10/09/2019 1722   APPEARANCEUR Clear 12/18/2014 1156   LABSPEC 1.016 10/09/2019 1722   PHURINE 8.0 10/09/2019 1722   GLUCOSEU NEGATIVE 10/09/2019 1722   HGBUR NEGATIVE 10/09/2019 1722   BILIRUBINUR NEGATIVE 10/09/2019 1722   BILIRUBINUR Negative 12/18/2014 1156   KETONESUR 20 (A) 10/09/2019 1722   PROTEINUR NEGATIVE 10/09/2019 1722   UROBILINOGEN 0.2 08/05/2014 0951   NITRITE NEGATIVE 10/09/2019 1722   LEUKOCYTESUR NEGATIVE 10/09/2019 1722    Radiological Exams on Admission: DG Chest Port 1 View  Result Date: 10/09/2019 CLINICAL DATA:  Shortness of breath. EXAM: PORTABLE CHEST 1 VIEW COMPARISON:  08/22/2019. FINDINGS: Cardiomegaly. Diffuse bilateral pulmonary infiltrates/edema, right side greater than left. [place] B-lines noted. These findings suggest CHF. Pneumonia cannot be excluded. No prominent pleural effusion. No pneumothorax. Degenerative changes and scoliosis thoracic spine. IMPRESSION: Cardiomegaly with diffuse bilateral pulmonary infiltrates/edema, right side greater than left. Findings suggest CHF. Bilateral pneumonia cannot be excluded. Electronically Signed   By: Marcello Moores  Register   On: 10/09/2019 15:43     EKG: Independently reviewed, with result as described above.    Assessment/Plan   74 y.o. female with medical history significant for paroxysmal atrial fibrillation chronically anticoagulated on Warfarin, OSA on nocturnal BiPAP, hypothyroidism, multiple sclerosis, who is admitted to [facility] on  10/09/2019 with acute hypoxic respiratory failure in the setting of suspected severe sepsis due to HCAP pna after presenting from home to [facility] Emergency Department complaining of shortness.    Principal Problem:   HCAP (healthcare-associated pneumonia) Active Problems:   Long term (current) use of anticoagulants   Atrial fibrillation with RVR (HCC)   Obstructive sleep apnea treated with bilevel positive airway pressure (BiPAP)   Acute respiratory failure with hypoxia (HCC)   Severe sepsis (HCC)   SOB (shortness of breath)    #) Severe sepsis due to HCAP: Diagnosis on the basis of to 3 days of progressive shortness of breath associated with subjective fever, with presenting chest x-ray showing interval development of bilateral airspace opacities concerning for infiltrates in the context of increased work of breathing as well as acute hypoxic respiratory failure, as further described below.  Suspect that this represents bacterial pneumonia, potentially secondary bacterial pneumonia given recent prior diagnosis of Covid pneumonia with positive rapid COVID-19 antigen result on 08/22/2019, from which the patient subsequently recovered before ultimately developing shortness of breath  2 to 3 days ago.  Given this timeframe, I do not suspect occurrence of COVID-19, but rather feel that this is more consistent with a secondary bacterial pneumonia.  Meets criteria to be considered healthcare associated on the basis of hospitalization in January 2021 associated with aforementioned COVID-19 infection.  SIRS criteria met via presenting leukocytosis, tachycardia, and tachypnea.  Criteria are met for patient sepsis to be considered severe nature on the basis of acute hypoxic respiratory failure.  Presentation has not been associated with any hypotension thus far. Given currently stable BP's, and due to concern for potential acute diastolic heart failure as a consequence of physiologic stress resulting  in poor rate control related to patient's atrial fibrillation, will refrain from aggressive IV fluids at this time.  Of note, urinalysis shows no evidence of underlying UTI.  The patient received doses of IV vancomycin as well as cefepime in the ED this evening. In the setting of HCAP, will continue with these antibiotics for broad-spectrum coverage, but will also add azithromycin for atypical coverage given bilateral distribution of infiltrates..  Plan: Stat blood cultures x2 prior to duration of any additional antibiotics.  Stat lactic acid.  Continue IV vancomycin and cefepime.  Add azithromycin, as described above.  Check MRSA PCR, with plan to discontinue IV vancomycin if MRSA PCR found to be negative given the high negative predictive value associated with the negative MRSA PCR in this setting.  Repeat CBC with differential in morning.  Monitor on telemetry.  Monitor continuous pulse oximetry.  Check strep pneumonia urine antigen. Will also check Legionella urine antigen given bilateral distribution of infiltrates as well as patient's report of associated nausea. Of note: As it has been greater than 21 days but within 3 months since the patient's positive COVID-19 test on 08/22/19, with interval improvement in initial symptoms, there is currently no indication to repeat COVID-19 testing or to maintain Airborne/Contact precautions, and I will be admitting this patient to a non-COVID unit.       #) Acute hypoxic respiratory failure: In the context of no baseline supplemental oxygen requirements, EMS reports initial oxygen saturation noted to be in the mid 80s on room air, which subsequently improved to 98-99% on 10 L nonrebreather by the time the patient arrived in the emergency department.  Subsequently, supplemental oxygen has been decreased to 4 L nasal cannula while maintaining oxygen saturations of 97%.  Suspect that the patient's acute hypoxic respiratory failure is multifactorial in nature, with  contributions from suspected HCAP pneumonia, as described above, as well as potential additional contribution from acute diastolic heart failure in the setting of recent use of sodium tablets causing relative fluid retention as well as the presenting presence of atrial fibrillation with RVR resulting in diminished diastolic filling time, as further described low.  Of note, presenting EKG shows atrial fibrillation with RVR, but demonstrates no evidence of acute ischemic changes.  Additionally, acute pulmonary embolism is felt to be unlikely given that the patient is chronically anticoagulated on warfarin in the setting of her history of paroxysmal atrial fibrillation, with presenting INR found to be therapeutic.   Plan: Work-up and management of severe sepsis due to suspected HCAP, as above.  Monitor continuous pulse oximetry.  Monitor on telemetry.  Additional work-up and management of atrial fibrillation with RVR and potential consequential acute diastolic heart failure, as further described below.  Check ABG.  Check serum phosphorus level.  Incentive spirometry has been ordered.  As needed albuterol inhaler for shortness of  breath.      #) Atrial fibrillation with RVR: In the setting of a known history of paroxysmal atrial fibrillation, the patient presents in atrial fibrillation with ventricular rates in the 140's, and a/w shortness of breath in the absence of chest pain. Of note, blood pressure appears to be tolerating these rates. In terms of factors leading to this exacerbation, suspect contribution from physiologic stress stemming from suspected presenting HCAP pneumonia, as above, as well as diminished AV nodal blocking abilities arising from recent changes to her AV nodal blocking regimen, including discontinuation of cardizem and bystolic, which were replaced with Lopressor 12.5 mg PO BID, as further described above. Another potential contributing factor may stem from increased fluid retention due  to recently initiated salt tabs as treatment for borderline hypotension during January hospitalization as above. Presenting EKG shows A Fib RVR with ventricular rate of 135 and no evidence of acute ischemic changes. Most recent echo, which occurred on 03/25/19, showed normal LV cavity size, LV EF 60-65%, severely dilated LA, no evidence of significant valvular pathology. Of note, echo performed at that time was unable to evaluate diastolic function in the setting of atrial fibrillation. In the setting of a CHA2DS2-VASc score of 5, there is an indication for the patient to be on chronic anticoagulation for thromboembolic prophylaxis. Consistent with this, the patient is chronically anticoagulated on Warfarin, with presenting INR found to be therapeutic at 2.6.  Of note, Diltiazem drip was started in the ED this evening, with ensuing HR into the 130's without associated hypotension. In general, will focus overnight treatment on suspected underlying severe sepsis due to HCAP along with gentle diuresis of any component of volume overload as opposed to aggressively attempting to rate control.    Plan: Monitor strict I's and O's and daily weights. Monitor on telemetry. Check serum magnesium level with prn supplementation to maintain levels of greater than or equal to 2.0. Repeat BMP in the AM.  Repeat CBC in the AM. Work-up and management of HCAP, as above. Gentle diuresis, as further described below. Continue Diltiazem drip, titrated to goal HR of < 120 bpm, while treatment suspected precipitating factors, as outlined above. Will home home metoprolol tartrate overnight, but strive for resumption of these medication 1-2 hours prior to discontinuation of dilt drip so as to prevent rebound tachycardia. Pharm consult placed to assist with dosing of Warfarin. May benefit from updated echo when no longer in A fib so as to better assess underlying diastolic function.      #) Acute diastolic heart failure: in the absence  of any known underlying dx of chronic heart failure, there is potential for finding of acute diastolic heart failure at the time of this evening's presentation given presenting SOB, BNP found to be 5 times higher than most recent prior value, and finding of potential edema on presenting CXR. Most recent echo performed in August 2020, with results as described above. Suspect that any component of acutely decompensated stems from physiologic stress and alteration of home AV nodal blocking regimen mediated atrial fibrillation, as described above, resulting in relative decline in cardiac output, in part due to loss of atrial kick, and complicated by increased likelihood of fluid retention in setting of recently initiated salt tabs. Outpatient diuretic regimen consists of Lasix 20 mg PO Qdaily, with which patient reports good compliance. Received Lasix 40 mg IV x 1 in the ED this evening. In the setting of ensuing diuresis and presenting serum potassium level, will provide dose of  oral potassium supplementation at this time, as quantified below.  Plan:  Given that the patient has now received an effective dose of Lasix that is 4 times her outpatient dosage, will refrain from additional IV Lasix overnight but rather closely monitor ensuing volume status as well as HR response to ensuing diuresis to help guide further approach to diuresis. Hold home salt tabs. Monitor strict I&O's and daily weights. Repeat BMP in the morning. Monitor on tele. Monitor continuous pulse-ox. Add-on serum Mg level. KCl 40 mEq PO x 1 now, as above. Consider stopping or reducing home midodrine as means to better optimize afterload reduction.      #) OSA: patient reports good compliance with home QHS BiPAP.   Plan: I have placed an order with respiratory therapy requesting that she be set-up with QHS BiPAP at home settings during this hospitalization.       #) Hypothyroidism: on Synthroid as an outpatient. In setting of presenting  Afib with RVR, will check TSH.   Plan: continue home Synthroid for now, and check TSH.      #) Multiple Sclerosis: the patient confirms that she takes Q48 Corticotropin injections over a 10 period per month, which she just completed last week.   Plan: ongoing management per outpatient provider.     DVT prophylaxis: continue home Warfarin; inpatient pharmacy consult placed to assist with this.   Code Status: per my discussions with the patient this evening, she conveys her wish to be DNR/DNI. Family Communication: none Disposition Plan: Per Rounding Team Consults called: none  Admission status: inpatient; stepdown unit.     PLEASE NOTE THAT DRAGON DICTATION SOFTWARE WAS USED IN THE CONSTRUCTION OF THIS NOTE.   [place] [facility] Pager [phone] From [place].   Otherwise, please contact night-coverage  [email or web address] Password [facility]  10/09/2019, 6:22 PM

## 2019-10-10 ENCOUNTER — Inpatient Hospital Stay (HOSPITAL_COMMUNITY): Payer: Medicare Other

## 2019-10-10 DIAGNOSIS — I5033 Acute on chronic diastolic (congestive) heart failure: Secondary | ICD-10-CM

## 2019-10-10 DIAGNOSIS — J9601 Acute respiratory failure with hypoxia: Secondary | ICD-10-CM | POA: Diagnosis present

## 2019-10-10 DIAGNOSIS — J9621 Acute and chronic respiratory failure with hypoxia: Secondary | ICD-10-CM | POA: Diagnosis present

## 2019-10-10 DIAGNOSIS — R0602 Shortness of breath: Secondary | ICD-10-CM

## 2019-10-10 DIAGNOSIS — Z7901 Long term (current) use of anticoagulants: Secondary | ICD-10-CM

## 2019-10-10 DIAGNOSIS — A419 Sepsis, unspecified organism: Secondary | ICD-10-CM | POA: Diagnosis present

## 2019-10-10 DIAGNOSIS — G35 Multiple sclerosis: Secondary | ICD-10-CM | POA: Diagnosis present

## 2019-10-10 LAB — COMPREHENSIVE METABOLIC PANEL
ALT: 17 U/L (ref 0–44)
AST: 15 U/L (ref 15–41)
Albumin: 3.3 g/dL — ABNORMAL LOW (ref 3.5–5.0)
Alkaline Phosphatase: 52 U/L (ref 38–126)
Anion gap: 10 (ref 5–15)
BUN: 17 mg/dL (ref 8–23)
CO2: 32 mmol/L (ref 22–32)
Calcium: 8.6 mg/dL — ABNORMAL LOW (ref 8.9–10.3)
Chloride: 97 mmol/L — ABNORMAL LOW (ref 98–111)
Creatinine, Ser: 0.49 mg/dL (ref 0.44–1.00)
GFR calc Af Amer: >60 mL/min (ref >60)
GFR calc non Af Amer: >60 mL/min (ref >60)
Glucose, Bld: 88 mg/dL (ref 70–99)
Potassium: 3.8 mmol/L (ref 3.5–5.1)
Sodium: 139 mmol/L (ref 135–145)
Total Bilirubin: 1.2 mg/dL (ref 0.3–1.2)
Total Protein: 6 g/dL — ABNORMAL LOW (ref 6.5–8.1)

## 2019-10-10 LAB — CBC WITH DIFFERENTIAL/PLATELET
Abs Immature Granulocytes: 0.05 10*3/uL (ref 0.00–0.07)
Basophils Absolute: 0.1 10*3/uL (ref 0.0–0.1)
Basophils Relative: 1 %
Eosinophils Absolute: 0.1 10*3/uL (ref 0.0–0.5)
Eosinophils Relative: 1 %
HCT: 41 % (ref 36.0–46.0)
Hemoglobin: 13.3 g/dL (ref 12.0–15.0)
Immature Granulocytes: 0 %
Lymphocytes Relative: 17 %
Lymphs Abs: 2.2 10*3/uL (ref 0.7–4.0)
MCH: 31.1 pg (ref 26.0–34.0)
MCHC: 32.4 g/dL (ref 30.0–36.0)
MCV: 95.8 fL (ref 80.0–100.0)
Monocytes Absolute: 1.9 10*3/uL — ABNORMAL HIGH (ref 0.1–1.0)
Monocytes Relative: 14 %
Neutro Abs: 8.8 10*3/uL — ABNORMAL HIGH (ref 1.7–7.7)
Neutrophils Relative %: 67 %
Platelets: 182 10*3/uL (ref 150–400)
RBC: 4.28 MIL/uL (ref 3.87–5.11)
RDW: 14.6 % (ref 11.5–15.5)
WBC: 13.1 10*3/uL — ABNORMAL HIGH (ref 4.0–10.5)
nRBC: 0 % (ref 0.0–0.2)

## 2019-10-10 LAB — BLOOD GAS, ARTERIAL
Acid-Base Excess: 8.8 mmol/L — ABNORMAL HIGH (ref 0.0–2.0)
Bicarbonate: 33.7 mmol/L — ABNORMAL HIGH (ref 20.0–28.0)
O2 Saturation: 91.5 %
Patient temperature: 37
pCO2 arterial: 48.1 mmHg — ABNORMAL HIGH (ref 32.0–48.0)
pH, Arterial: 7.459 — ABNORMAL HIGH (ref 7.350–7.450)
pO2, Arterial: 57.7 mmHg — ABNORMAL LOW (ref 83.0–108.0)

## 2019-10-10 LAB — LACTIC ACID, PLASMA: Lactic Acid, Venous: 1.4 mmol/L (ref 0.5–1.9)

## 2019-10-10 LAB — PHOSPHORUS: Phosphorus: 3.2 mg/dL (ref 2.5–4.6)

## 2019-10-10 LAB — TSH: TSH: 1.374 u[IU]/mL (ref 0.350–4.500)

## 2019-10-10 LAB — ECHOCARDIOGRAM COMPLETE
Height: 63 in
Weight: 3068.8 oz

## 2019-10-10 LAB — PROTIME-INR
INR: 2.9 — ABNORMAL HIGH (ref 0.8–1.2)
Prothrombin Time: 30.4 seconds — ABNORMAL HIGH (ref 11.4–15.2)

## 2019-10-10 LAB — MAGNESIUM: Magnesium: 1.9 mg/dL (ref 1.7–2.4)

## 2019-10-10 MED ORDER — PSYLLIUM 95 % PO PACK
1.0000 | PACK | Freq: Every day | ORAL | Status: DC
  Administered 2019-10-10: 1 via ORAL
  Filled 2019-10-10 (×2): qty 1

## 2019-10-10 MED ORDER — METOPROLOL TARTRATE 25 MG PO TABS
25.0000 mg | ORAL_TABLET | Freq: Two times a day (BID) | ORAL | Status: DC
  Administered 2019-10-10 (×2): 25 mg via ORAL
  Filled 2019-10-10 (×2): qty 1

## 2019-10-10 MED ORDER — PANTOPRAZOLE SODIUM 40 MG PO TBEC
40.0000 mg | DELAYED_RELEASE_TABLET | Freq: Every day | ORAL | Status: DC
  Administered 2019-10-10 – 2019-10-17 (×8): 40 mg via ORAL
  Filled 2019-10-10 (×8): qty 1

## 2019-10-10 MED ORDER — LACTULOSE 10 GM/15ML PO SOLN
10.0000 g | Freq: Two times a day (BID) | ORAL | Status: DC | PRN
  Administered 2019-10-10 – 2019-10-16 (×8): 10 g via ORAL
  Filled 2019-10-10 (×2): qty 30
  Filled 2019-10-10: qty 15
  Filled 2019-10-10: qty 30
  Filled 2019-10-10: qty 15
  Filled 2019-10-10 (×4): qty 30

## 2019-10-10 MED ORDER — FUROSEMIDE 20 MG PO TABS
20.0000 mg | ORAL_TABLET | Freq: Every day | ORAL | Status: DC
  Administered 2019-10-10 – 2019-10-15 (×6): 20 mg via ORAL
  Filled 2019-10-10 (×6): qty 1

## 2019-10-10 MED ORDER — DILTIAZEM HCL 30 MG PO TABS
30.0000 mg | ORAL_TABLET | Freq: Once | ORAL | Status: AC
  Administered 2019-10-10: 30 mg via ORAL
  Filled 2019-10-10: qty 1

## 2019-10-10 MED ORDER — WARFARIN SODIUM 4 MG PO TABS
4.0000 mg | ORAL_TABLET | Freq: Once | ORAL | Status: AC
  Administered 2019-10-10: 4 mg via ORAL
  Filled 2019-10-10: qty 1

## 2019-10-10 MED ORDER — ORAL CARE MOUTH RINSE: 15.0000 mL | Freq: Two times a day (BID) | OROMUCOSAL | Status: DC

## 2019-10-10 NOTE — Progress Notes (Addendum)
PROGRESS NOTE  Toni [name] [id number] DOB: 16-Mar-1946 DOA: 10/09/2019 PCP: Prince Solian, MD   LOS: 1 day   Brief narrative:  As per HPI,  Toni [name] is a 74 y.o. female with medical history significant for paroxysmal atrial fibrillation chronically anticoagulated on Warfarin, OSA on nocturnal BiPAP, hypothyroidism, multiple sclerosis,  with recent admission to the hospital  from 08/22/19 through 08/29/19 for  COVID-19 infection.  She did have orthostatic hypotension and several medications were addressed at that time.  On 08/29/19 the patient was discharged to [facility] was subsequently discharged from SNF to home on 10/07/19.  This time patient presented with shortness of breath subjective fever.  No orthopnea or PND.  Patient has been taking metoprolol and Lasix as outpatient.EMS was called into noted the patient to be hypoxic with O2 sats in the mid 80's on RA. She was started on 10 L non-rebreather, with ensuing increase in O2 sats to the high 90's  ED Course:  Na 142, potassium 3.8, Cr 0.44 relative to 0.43 on 08/26/19. BNP 381 relative to most recent prior value of 82 on 08/22/19. CBC notable for wbc of 13,400 with 75% neutrophils, hgb 14.3; INR 2.6. UA showed no wbc's and was LE and nitrite negative. CXR, relative to CXR from 08/22/19, showed interval development of bilateral airspace opacities concerning for infiltrates, right greater than left. EKG showed A fib with RVR and ventricular rate of 135; with T-wave inversion in V1, which was unchanged from EKG performe don 08/27/19, and no evidence of ST changes, including no evidence of ST elevation.  In the ED, patient received Lasix, Tylenol cefepime and vancomycin and was started on Cardizem.  Patient was then admitted at the stepdown unitfor further evaluation and management of acute hypoxic respiratory failure in the setting of severe sepsis due to suspected HCAP PNA as well as additional evaluation of Atrial fibrillation with RVR.    Assessment/Plan:  Principal Problem:   HCAP (healthcare-associated pneumonia) Active Problems:   Long term (current) use of anticoagulants   Atrial fibrillation with RVR (HCC)   Obstructive sleep apnea treated with bilevel positive airway pressure (BiPAP)   Acute respiratory failure with hypoxia (HCC)   Severe sepsis (HCC)   SOB (shortness of breath)   Sepsis due to HCAP:  Prior history of Covid pneumonia.  Possibility of secondary bacterial pneumonia.  Patient presented with leukocytosis, tachycardia and tachypnea with acute hypoxic respiratory failure.  Urinalysis negative for infection.  Continue vancomycin, cefepime azithromycin.  Follow blood cultures-negative in less than 12 hours.  Strep urinary antigen was negative.  Legionella antigen pending.  No need for isolation at this time since patient is more than 21 days. Lactate was 1.4.  Acute hypoxic respiratory failure: Likely multifactorial from healthcare associated pneumonia, diastolic heart failure, OSA, atrial fibrillation with RVR.  Patient is therapeutically anticoagulated so low suspicion for pulmonary embolism.  Patient presented with oxygen saturation in the mid 80s improved with nonrebreather mask to 98-99%.  Currently on nasal cannula oxygen 4 L/min continue incentive spirometry, inhaler. We will continue to wean oxygen as possible.   Atrial fibrillation with RVR: Likely exacerbated by pneumonia.  Patient used to be on Cardizem and Bystolic which was replaced with Lopressor in the last admission.  EKG with RVR but no ischemic changes. Most recent echo, which occurred on 03/25/19, showed normal LV cavity size, LV EF 60-65%, severely dilated LA, no evidence of significant valvular pathology. CHA2DS2-VASc score of 5, the patient is chronically anticoagulated on  Warfarin, with presenting INR found to be therapeutic at 2.6.   On Cardizem drip titrated to goal HR of < 123456 bpm.  Certainly improved at 5 mg/h.  Will change to oral  metoprolol and discontinue Cardizem drip.  Check 2D echocardiogram for repeat evaluation.  Possible acute diastolic heart failure:  Likely is exacerbated by atrial fibrillation with RVR and use of salt tablets at home.  Elevated BNP with congestion in the chest x-ray.  2D echocardiogram from 03/25/2019 reviewed.  Patient is on Lasix 20 mg PO Qdaily at home.  Received 40 mg IV Lasix in the ED..  Monitor strict I&O's and daily weights.  Will hold midodrine for now if blood pressure remains okay.  Hold tamsulosin for now.  Hold sodium tablets.  Resume low-dose Lasix.  Check 2D echocardiogram.  OSA: On QHS BiPAP at home.  Hypothyroidism: TSH of 1.3.  Continue home Synthroid.    Multiple Sclerosis: Corticotropin injections over a 10 day period per month, which she just completed last week.  Follow-up with primary care physician as outpatient.  Patient has a mild right lower extremity weakness and is wheelchair-bound because of multiple sclerosis.   VTE Prophylaxis: Coumadin  Code Status: DNR  Family Communication: I spoke with the patient's husband on the phone and updated him about the clinical condition of the patient.   Disposition Plan:  . Patient is from home . Likely disposition to home with home health likely in 2 to 3 days . Barriers to discharge: Pending clinical improvement, IV antibiotics, supplemental oxygen  Consultants:  None  Procedures:  None  Antibiotics:  . Vancomycin, cefepime, azithromycin  Anti-infectives (From admission, onward)   Start     Dose/Rate Route Frequency Ordered Stop   10/10/19 1800  vancomycin (VANCOCIN) IVPB 1000 mg/200 mL premix     1,000 mg 200 mL/hr over 60 Minutes Intravenous Every 24 hours 10/09/19 2002     10/10/19 0100  ceFEPIme (MAXIPIME) 2 g in sodium chloride 0.9 % 100 mL IVPB     2 g 200 mL/hr over 30 Minutes Intravenous Every 8 hours 10/09/19 2002     10/09/19 2030  azithromycin (ZITHROMAX) 500 mg in sodium chloride 0.9 % 250  mL IVPB     500 mg 250 mL/hr over 60 Minutes Intravenous Every 24 hours 10/09/19 1943     10/09/19 1700  vancomycin (VANCOREADY) IVPB 1500 mg/300 mL     1,500 mg 150 mL/hr over 120 Minutes Intravenous  Once 10/09/19 1646 10/09/19 2100   10/09/19 1645  ceFEPIme (MAXIPIME) 1 g in sodium chloride 0.9 % 100 mL IVPB  Status:  Discontinued     1 g 200 mL/hr over 30 Minutes Intravenous  Once 10/09/19 1634 10/09/19 1636   10/09/19 1645  ceFEPIme (MAXIPIME) 2 g in sodium chloride 0.9 % 100 mL IVPB     2 g 200 mL/hr over 30 Minutes Intravenous  Once 10/09/19 1637 10/09/19 1719     Subjective: Today, patient was seen and examined at bedside.  Patient still has a mild cough but has felt little better with breathing.  Still short winded.  Denies any chest pain, no palpitation.  Objective: Vitals:   10/10/19 0344 10/10/19 0400  BP:  (!) 109/46  Pulse:  (!) 52  Resp:  13  Temp: 97.6 F (36.4 C)   SpO2:  95%    Intake/Output Summary (Last 24 hours) at 10/10/2019 0731 Last data filed at 10/10/2019 0600 Gross per 24 hour  Intake 521.6  ml  Output 625 ml  Net -103.4 ml   Filed Weights   10/09/19 1531 10/09/19 2045 10/10/19 0400  Weight: 81.2 kg 87.1 kg 87 kg   Body mass index is 33.98 kg/m.   Physical Exam: GENERAL: Patient is alert awake and oriented. Not in obvious distress.  Obese, on nasal cannula oxygen at 4 L/min HENT: No scleral pallor or icterus. Pupils equally reactive to light. Oral mucosa is moist NECK: is supple, no gross swelling noted. CHEST: Diminished breath sounds bilaterally.  No obvious wheezes noted CVS: S1 and S2 heard, no murmur.  Irregular rhythm with tachycardia ABDOMEN: Soft, non-tender, bowel sounds are present. EXTREMITIES: Trace lower extremity edema, mild right leg weakness CNS: Cranial nerves are intact. No focal motor deficits. SKIN: warm and dry without rashes.  Data Review: I have personally reviewed the following laboratory data and  studies,  CBC: Recent Labs  Lab 10/09/19 1557 10/10/19 0044  WBC 13.4* 13.1*  NEUTROABS 9.9* 8.8*  HGB 14.3 13.3  HCT 44.0 41.0  MCV 95.7 95.8  PLT 197 [id number]   Basic Metabolic Panel: Recent Labs  Lab 10/09/19 1557 10/09/19 1943 10/10/19 0044  NA 142  --  139  K 3.8  --  3.8  CL 100  --  97*  CO2 33*  --  32  GLUCOSE 92  --  88  BUN 19  --  17  CREATININE 0.44  --  0.49  CALCIUM 8.9  --  8.6*  MG  --  2.1 1.9  PHOS  --   --  3.2   Liver Function Tests: Recent Labs  Lab 10/09/19 1557 10/10/19 0044  AST 15 15  ALT 21 17  ALKPHOS 59 52  BILITOT 0.9 1.2  PROT 6.4* 6.0*  ALBUMIN 3.6 3.3*   No results for input(s): LIPASE, AMYLASE in the last 168 hours. No results for input(s): AMMONIA in the last 168 hours. Cardiac Enzymes: No results for input(s): CKTOTAL, CKMB, CKMBINDEX, TROPONINI in the last 168 hours. BNP (last 3 results) Recent Labs    08/22/19 1404 10/09/19 1557  BNP 81.8 381.6*    ProBNP (last 3 results) No results for input(s): PROBNP in the last 8760 hours.  CBG: No results for input(s): GLUCAP in the last 168 hours. Recent Results (from the past 240 hour(s))  MRSA PCR Screening     Status: None   Collection Time: 10/09/19  7:46 PM   Specimen: Nasal Mucosa; Nasopharyngeal  Result Value Ref Range Status   MRSA by PCR NEGATIVE NEGATIVE Final    Comment:        The GeneXpert MRSA Assay (FDA approved for NASAL specimens only), is one component of a comprehensive MRSA colonization surveillance program. It is not intended to diagnose MRSA infection nor to guide or monitor treatment for MRSA infections. Performed at [facility], [place] [address] [place], [place] [address]   Culture, blood (Routine X 2) w Reflex to ID Panel     Status: None (Preliminary result)   Collection Time: 10/09/19  9:04 PM   Specimen: BLOOD  Result Value Ref Range Status   Specimen Description   Final    BLOOD BLOOD RIGHT WRIST Performed at  [facility] [address], [place],  [address]    Special Requests   Final    BOTTLES DRAWN AEROBIC ONLY Blood Culture results may not be optimal due to an excessive volume of blood received in culture bottles Performed at [facility]  [place] [address] [place], [place] [address]    Culture   Final    NO GROWTH < 12 HOURS Performed at [facility] [address] [place], [place] [address]    Report Status PENDING  Incomplete  Culture, blood (Routine X 2) w Reflex to ID Panel     Status: None (Preliminary result)   Collection Time: 10/09/19  9:04 PM   Specimen: BLOOD  Result Value Ref Range Status   Specimen Description   Final    BLOOD BLOOD RIGHT ARM Performed at [facility] [address], [place], [place] [address]    Special Requests   Final    BOTTLES DRAWN AEROBIC AND ANAEROBIC Blood Culture results may not be optimal due to an excessive volume of blood received in culture bottles Performed at [facility] [address], [place], [place] [address]    Culture   Final    NO GROWTH < 12 HOURS Performed at [facility] [address], [place], [place] [address]    Report Status PENDING  Incomplete     Studies: DG Chest Port 1 View  Result Date: 10/09/2019 CLINICAL DATA:  Shortness of breath. EXAM: PORTABLE CHEST 1 VIEW COMPARISON:  08/22/2019. FINDINGS: Cardiomegaly. Diffuse bilateral pulmonary infiltrates/edema, right side greater than left. Kerley B-lines noted. These findings suggest CHF. Pneumonia cannot be excluded. No prominent pleural effusion. No pneumothorax. Degenerative changes and scoliosis thoracic spine. IMPRESSION: Cardiomegaly with diffuse bilateral pulmonary infiltrates/edema, right side greater than left. Findings suggest CHF. Bilateral pneumonia cannot be excluded. Electronically Signed   By: Marcello Moores  Register   On: 10/09/2019 15:43      Flora Lipps,  MD  [facility] 10/10/2019

## 2019-10-10 NOTE — Progress Notes (Signed)
  Echocardiogram 2D Echocardiogram has been performed.  Toni [name] 10/10/2019, 12:57 PM

## 2019-10-10 NOTE — Progress Notes (Signed)
[facility] for warfarin Indication: hx atrial fibrillation  Allergies  Allergen Reactions  . Chlorhexidine Hives  . Hydrocodone Nausea And Vomiting    Hydrocodone causes vomiting  . Oxycodone Nausea And Vomiting    Oral oxycodone causes vomiting  . Chlorhexidine Gluconate Rash    Sores at site  . Zoloft [Sertraline] Hives, Swelling and Rash   Patient Measurements: Height: 5\' 3"  (160 cm) Weight: 191 lb 12.8 oz (87 kg) IBW/kg (Calculated) : 52.4 Heparin Dosing Weight:   Vital Signs: Temp: 98.4 F (36.9 C) (02/19 0814) Temp Source: Oral (02/19 0814) BP: 113/42 (02/19 0845) Pulse Rate: 89 (02/19 1100)  Labs: Recent Labs    10/08/19 0000 10/09/19 1557 10/10/19 0044  HGB  --  14.3 13.3  HCT  --  44.0 41.0  PLT  --  197 182  LABPROT  --  27.9* 30.4*  INR 2.9 2.6* 2.9*  CREATININE  --  0.44 0.49    Estimated Creatinine Clearance: 66.4 mL/min (by C-G formula based on SCr of 0.49 mg/dL).   Medications:  PTA regimen: 5mg  daily except 2.5 mg on Mondays and Wednesdays--> per [facility] clinic visit on 10/08/2019.  However, patient said that since she got discharged from rehab facility (10/07/19), she has been using their medication dose packs with a warfarin regimen of 4mg  daily except 4.5 mg on Mon and Fri  Assessment: Patient is a 74 y.o F with hx afib on warfarin on admission presented to the ED on 2/18 with SOB.  Warfarin resumed on admission.  Today, 10/10/2019: - INR remains therapeutic at 2.9 - cbc stable - drug-drug intxns: abx can make pt more sensitive to warfarin  Goal of Therapy:  INR 2-3 Monitor platelets by anticoagulation protocol: Yes   Plan:  - warfarin 4 mg PO x1 at 1800 - daily INR - monitor for s/s bleeding  Minda Ditto PharmD 10/10/2019,12:01 PM

## 2019-10-10 NOTE — Progress Notes (Signed)
ABG obtained on Auto BIPAP with 3 LPM O2 bleed in.  Results for Toni [name], Toni [name] (MRN [id number]) as of 10/10/2019 03:41  Ref. Range 10/10/2019 03:25  pH, Arterial Latest Ref Range: 7.350 - 7.450  7.459 (H)  pCO2 arterial Latest Ref Range: 32.0 - 48.0 mmHg 48.1 (H)  pO2, Arterial Latest Ref Range: 83.0 - 108.0 mmHg 57.7 (L)  Acid-Base Excess Latest Ref Range: 0.0 - 2.0 mmol/L 8.8 (H)  Bicarbonate Latest Ref Range: 20.0 - 28.0 mmol/L 33.7 (H)  O2 Saturation Latest Units: % 91.5  Patient temperature Unknown 37.0  [place]ens test (pass/fail) Latest Ref Range: PASS  PASS

## 2019-10-11 LAB — COMPREHENSIVE METABOLIC PANEL
ALT: 16 U/L (ref 0–44)
AST: 13 U/L — ABNORMAL LOW (ref 15–41)
Albumin: 3 g/dL — ABNORMAL LOW (ref 3.5–5.0)
Alkaline Phosphatase: 44 U/L (ref 38–126)
Anion gap: 8 (ref 5–15)
BUN: 23 mg/dL (ref 8–23)
CO2: 30 mmol/L (ref 22–32)
Calcium: 8.2 mg/dL — ABNORMAL LOW (ref 8.9–10.3)
Chloride: 100 mmol/L (ref 98–111)
Creatinine, Ser: 0.36 mg/dL — ABNORMAL LOW (ref 0.44–1.00)
GFR calc Af Amer: >60 mL/min (ref >60)
GFR calc non Af Amer: >60 mL/min (ref >60)
Glucose, Bld: 84 mg/dL (ref 70–99)
Potassium: 3.6 mmol/L (ref 3.5–5.1)
Sodium: 138 mmol/L (ref 135–145)
Total Bilirubin: 0.8 mg/dL (ref 0.3–1.2)
Total Protein: 5.5 g/dL — ABNORMAL LOW (ref 6.5–8.1)

## 2019-10-11 LAB — CBC
HCT: 35.8 % — ABNORMAL LOW (ref 36.0–46.0)
Hemoglobin: 11.5 g/dL — ABNORMAL LOW (ref 12.0–15.0)
MCH: 30.8 pg (ref 26.0–34.0)
MCHC: 32.1 g/dL (ref 30.0–36.0)
MCV: 96 fL (ref 80.0–100.0)
Platelets: 157 10*3/uL (ref 150–400)
RBC: 3.73 MIL/uL — ABNORMAL LOW (ref 3.87–5.11)
RDW: 14.5 % (ref 11.5–15.5)
WBC: 7.8 10*3/uL (ref 4.0–10.5)
nRBC: 0 % (ref 0.0–0.2)

## 2019-10-11 LAB — PHOSPHORUS: Phosphorus: 3.3 mg/dL (ref 2.5–4.6)

## 2019-10-11 LAB — MAGNESIUM: Magnesium: 2.1 mg/dL (ref 1.7–2.4)

## 2019-10-11 LAB — PROTIME-INR
INR: 4.5 (ref 0.8–1.2)
Prothrombin Time: 42.7 seconds — ABNORMAL HIGH (ref 11.4–15.2)

## 2019-10-11 MED ORDER — SODIUM CHLORIDE 0.9 % IV SOLN
1.0000 g | INTRAVENOUS | Status: DC
  Administered 2019-10-11 – 2019-10-15 (×5): 1 g via INTRAVENOUS
  Filled 2019-10-11 (×2): qty 1
  Filled 2019-10-11: qty 10
  Filled 2019-10-11 (×2): qty 1

## 2019-10-11 MED ORDER — AZITHROMYCIN 250 MG PO TABS
500.0000 mg | ORAL_TABLET | Freq: Every day | ORAL | Status: DC
  Administered 2019-10-11 – 2019-10-14 (×4): 500 mg via ORAL
  Filled 2019-10-11 (×4): qty 2

## 2019-10-11 MED ORDER — POLYETHYLENE GLYCOL 3350 17 G PO PACK
17.0000 g | PACK | Freq: Every day | ORAL | Status: DC
  Administered 2019-10-11 – 2019-10-16 (×6): 17 g via ORAL
  Filled 2019-10-11 (×6): qty 1

## 2019-10-11 MED ORDER — DILTIAZEM HCL ER COATED BEADS 180 MG PO CP24
180.0000 mg | ORAL_CAPSULE | Freq: Every day | ORAL | Status: DC
  Administered 2019-10-11 – 2019-10-17 (×7): 180 mg via ORAL
  Filled 2019-10-11 (×7): qty 1

## 2019-10-11 MED ORDER — LORATADINE 10 MG PO TABS
10.0000 mg | ORAL_TABLET | Freq: Every day | ORAL | Status: DC | PRN
  Administered 2019-10-11: 10 mg via ORAL
  Filled 2019-10-11 (×2): qty 1

## 2019-10-11 MED ORDER — SALINE SPRAY 0.65 % NA SOLN
1.0000 | NASAL | Status: DC | PRN
  Administered 2019-10-11: 1 via NASAL
  Filled 2019-10-11: qty 44

## 2019-10-11 NOTE — Progress Notes (Signed)
CRITICAL VALUE ALERT  Critical Value:  INR 4.5  Date & Time Notied:  10/11/2019 04:05 AM   Provider Notified: Dr. Silas [place]   Orders Received/Actions taken: No new orders at current time.

## 2019-10-11 NOTE — Progress Notes (Signed)
PT Cancellation Note  Patient Details Name: Toni [name] MRN: [id number] DOB: 04/18/1946   Cancelled Treatment:    Reason Eval/Treat Not Completed: Patient not medically ready. Pt INR elevated, defer today and continue efforts.   [facility] 10/11/2019, 8:58 AM

## 2019-10-11 NOTE — Progress Notes (Signed)
PROGRESS NOTE    Toni [name]  [id number] DOB: 1946-03-24 DOA: 10/09/2019 PCP: Prince Solian, MD    Brief Narrative:  Patient was admitted to the hospital with a working diagnosis of acute hypoxic respiratory failure due to pneumonia, complicated by severe sepsis.  74 year old female who presented with dyspnea.  She does have significant past medical history for paroxysmal atrial fibrillation, hypothyroidism, multiple sclerosis (wheelchair bound for the last 3 years) and obstructive sleep apnea.  Recent hospitalization 08/22/19 to 08/29/19 for [id number] infection complicated by hypotension and urinary tract infection.  Discharged to skilled nursing facility. From SNF she was discharged home on 10/07/19.   2 days prior to hospitalization she developed dyspnea, associated with subjective fevers but with no cough or congestion. EMS was called and his oximetry was 80% on room air. Physical examination her blood pressure was 126/77, heart rate 76-97, respiratory rate 17, oxygen saturation 98%, she had moist mucous membranes, her lungs had no wheezing or rails, heart S1-S2, present rhythm, soft abdomen, non pitting lower extremity edema. Sodium 142, potassium 3.8, chloride 100, bicarb 33, glucose 92, BUN 19, creatinine 0.44, white count 13.4, hemoglobin 14.3, hematocrit 44.0, platelets 197.  Urine analysis negative for infection.  Chest radiograph had bilateral hilar congestion, reticulonodular infiltrate at the right base/right middle lobe.  EKG 135 bpm, normal axis, normal QRS, atrial fibrillation rhythm, no ST segment or T wave changes.   Patient received supplemental oxygen per nonrebreather mask, and broad spectrum antibiotic therapy, along with diuresis with furosemide.  Oxygen requirements have improved and patient was weaned off to nasal cannula.  Assessment & Plan:   Principal Problem:   HCAP (healthcare-associated pneumonia) Active Problems:   Long term (current) use of  anticoagulants   Atrial fibrillation with RVR (HCC)   Obstructive sleep apnea treated with bilevel positive airway pressure (BiPAP)   Acute respiratory failure with hypoxia (HCC)   Severe sepsis ([place])   1. Acute hypoxic respiratory failure due to right middle lobe pneumonia, complicated with sepsis (end-organ damage hypoxemia)/ present on admission.  Oxymetry is 100% on 3 LPM per [place], wbc down to 7,8, cultures have been no growth. Patient is wheelchair bound and not interested in SNF. Will continue antibiotic therapy with ceftriaxone IV and oral azithromycin, currently on 2 doses of effective antibiotic therapy, will plan for 5 doses.   Continue oxymetry monitoring, follow a restrictive IV fluids strategy, consult physical and occupational therapy. Continue bronchodilator therapy.   Transfer to medical telemetry ward.   2. Atrial fibrillation with RVR. Will resume oral diltiazem, 180 mg daily, dc metoprolol and continue anticoagulation with warfarin. Supra-therapeutic INR at 4,5, will follow pharmacy protocol, target INR 2 to 3.   3. Acute on chronic diastolic heart failure. Patient responded well to diureses with furosemide, urine output over last 24 H is 1000 ml. Blood pressure this am 99 to 123456 systolic. Continue diuresis with oral furosemide and rate control atrial fibrillation with diltiazem.   4. Hypothyroid. Continue levothyroxine.  5. Multiple sclerosis. No clinical signs of exacerbation, will continue physical and occupational therapy evaliuation. Patient has been wheelchair bound for about 3 years.   6. Dyslipidemia. Continue with simvastatin.   DVT prophylaxis: warfarin   Code Status:  DNR  Family Communication: no family at the bedside  Disposition Plan/ discharge barriers: transfer to telemetry.    Antimicrobials:   Azithromycin   Ceftriaxone     Subjective: Patient is feeling better, but not yet back to baseline, continue to have dyspnea,  but no chest pain, chronic  lower extremity edema, no nausea or vomiting.   Objective: Vitals:   10/11/19 0014 10/11/19 0400 10/11/19 0419 10/11/19 0426  BP:  (!) 103/39    Pulse:  69    Resp:  16    Temp: 97.7 F (36.5 C)   (!) 97.5 F (36.4 C)  TempSrc: Axillary   Oral  SpO2:  98%    Weight:   87 kg   Height:        Intake/Output Summary (Last 24 hours) at 10/11/2019 0750 Last data filed at 10/11/2019 0603 Gross per 24 hour  Intake 1117.48 ml  Output 1000 ml  Net 117.48 ml   Filed Weights   10/09/19 2045 10/10/19 0400 10/11/19 0419  Weight: 87.1 kg 87 kg 87 kg    Examination:   General: deconditioned  Neurology: Awake and alert, non focal  E ENT: mild pallor, no icterus, oral mucosa moist Cardiovascular: No JVD. S1-S2 present, rhythmic, no gallops, rubs, or murmurs. Non pitting lower extremity edema. Pulmonary: positive breath sounds bilaterally, decreased air movement, no wheezing, rhonchi or rales. (anterior auscultation) Gastrointestinal. Abdomen with no organomegaly, non tender, no rebound or guarding Skin. No rashes Musculoskeletal: no joint deformities     Data Reviewed: I have personally reviewed following labs and imaging studies  CBC: Recent Labs  Lab 10/09/19 1557 10/10/19 0044 10/11/19 0256  WBC 13.4* 13.1* 7.8  NEUTROABS 9.9* 8.8*  --   HGB 14.3 13.3 11.5*  HCT 44.0 41.0 35.8*  MCV 95.7 95.8 96.0  PLT 197 182 [id number]   Basic Metabolic Panel: Recent Labs  Lab 10/09/19 1557 10/09/19 1943 10/10/19 0044 10/11/19 0256  NA 142  --  139 138  K 3.8  --  3.8 3.6  CL 100  --  97* 100  CO2 33*  --  32 30  GLUCOSE 92  --  88 84  BUN 19  --  17 23  CREATININE 0.44  --  0.49 0.36*  CALCIUM 8.9  --  8.6* 8.2*  MG  --  2.1 1.9 2.1  PHOS  --   --  3.2 3.3   GFR: Estimated Creatinine Clearance: 66.4 mL/min (A) (by C-G formula based on SCr of 0.36 mg/dL (L)). Liver Function Tests: Recent Labs  Lab 10/09/19 1557 10/10/19 0044 10/11/19 0256  AST 15 15 13*  ALT 21 17 16     ALKPHOS 59 52 44  BILITOT 0.9 1.2 0.8  PROT 6.4* 6.0* 5.5*  ALBUMIN 3.6 3.3* 3.0*   No results for input(s): LIPASE, AMYLASE in the last 168 hours. No results for input(s): AMMONIA in the last 168 hours. Coagulation Profile: Recent Labs  Lab 10/08/19 0000 10/09/19 1557 10/10/19 0044 10/11/19 0256  INR 2.9 2.6* 2.9* 4.5*   Cardiac Enzymes: No results for input(s): CKTOTAL, CKMB, CKMBINDEX, TROPONINI in the last 168 hours. BNP (last 3 results) No results for input(s): PROBNP in the last 8760 hours. HbA1C: No results for input(s): HGBA1C in the last 72 hours. CBG: No results for input(s): GLUCAP in the last 168 hours. Lipid Profile: No results for input(s): CHOL, HDL, LDLCALC, TRIG, CHOLHDL, LDLDIRECT in the last 72 hours. Thyroid Function Tests: Recent Labs    10/10/19 0044  TSH 1.374   Anemia Panel: No results for input(s): VITAMINB12, FOLATE, FERRITIN, TIBC, IRON, RETICCTPCT in the last 72 hours.    Radiology Studies: I have reviewed all of the imaging during this hospital visit personally     Scheduled  Meds: . buPROPion  300 mg Oral Daily  . docusate sodium  100 mg Oral Daily  . famotidine  20 mg Oral QHS  . furosemide  20 mg Oral Daily  . gabapentin  300 mg Oral TID  . levothyroxine  75 mcg Oral Daily  . mouth rinse  15 mL Mouth Rinse BID  . metoprolol tartrate  25 mg Oral BID  . midodrine  5 mg Oral TID WC  . pantoprazole  40 mg Oral Daily  . psyllium  1 packet Oral Daily  . simvastatin  10 mg Oral QPM  . sodium chloride flush  3 mL Intravenous Q12H  . Warfarin - Pharmacist Dosing Inpatient   Does not apply q1800   Continuous Infusions: . azithromycin Stopped (10/10/19 2042)  . ceFEPime (MAXIPIME) IV Stopped (10/11/19 0049)  . diltiazem (CARDIZEM) infusion Stopped (10/10/19 1044)     LOS: 2 days        Cristianna Cyr Gerome Apley, MD

## 2019-10-11 NOTE — Progress Notes (Signed)
[place] for warfarin Indication: hx atrial fibrillation  Allergies  Allergen Reactions  . Chlorhexidine Hives  . Hydrocodone Nausea And Vomiting    Hydrocodone causes vomiting  . Oxycodone Nausea And Vomiting    Oral oxycodone causes vomiting  . Chlorhexidine Gluconate Rash    Sores at site  . Zoloft [Sertraline] Hives, Swelling and Rash   Patient Measurements: Height: 5\' 3"  (160 cm) Weight: 191 lb 12.8 oz (87 kg) IBW/kg (Calculated) : 52.4  Vital Signs: Temp: 97.4 F (36.3 C) (02/20 0803) Temp Source: Oral (02/20 0803) BP: 103/39 (02/20 0400) Pulse Rate: 69 (02/20 0400)  Labs: Recent Labs    10/09/19 1557 10/09/19 1557 10/10/19 0044 10/11/19 0256  HGB 14.3   < > 13.3 11.5*  HCT 44.0  --  41.0 35.8*  PLT 197  --  182 157  LABPROT 27.9*  --  30.4* 42.7*  INR 2.6*  --  2.9* 4.5*  CREATININE 0.44  --  0.49 0.36*   < > = values in this interval not displayed.   Estimated Creatinine Clearance: 66.4 mL/min (A) (by C-G formula based on SCr of 0.36 mg/dL (L)).  Medications:  PTA regimen: 5mg  daily except 2.5 mg on Mondays and Wednesdays--> per [facility] clinic visit on 10/08/2019.  However, patient said that since she got discharged from rehab facility (10/07/19), she has been using their medication dose packs with a warfarin regimen of 4mg  daily except 4.5 mg on Mon and Fri  Assessment: 74 y.o F with hx afib on warfarin on admission presented to the ED on 2/18 with ShOB.  Warfarin resumed on admission.  Today, 10/11/2019: - INR elevated at 4.5 - cbc decreased, Plt wnl - drug-drug intxns: abx can make pt more sensitive to warfarin, Current Azithromycin, Cefepime  Goal of Therapy:  INR 2-3 Monitor platelets by anticoagulation protocol: Yes   Plan:  No Warfarin today Daily Protime/INR Monitor po intake, s/s bleed  Minda Ditto PharmD 10/11/2019,8:09 AM

## 2019-10-11 NOTE — Progress Notes (Signed)
Patient to transfer to [facility] report given to receiving nurse, all questions answered at this time.  Pt. VSS with no s/s of distress.  Patient stable at transfer.

## 2019-10-11 NOTE — Progress Notes (Signed)
Received handoff report from [place], [place], pt transfer in bed, in stable condition. Pt a/o mild pain related to move, Baclofen implantable patent. Pt on tele and verified, VSs.Skin assessed, Allevyn replaced. SRP, RN

## 2019-10-12 DIAGNOSIS — G35 Multiple sclerosis: Secondary | ICD-10-CM

## 2019-10-12 LAB — CBC WITH DIFFERENTIAL/PLATELET
Abs Immature Granulocytes: 0.02 10*3/uL (ref 0.00–0.07)
Basophils Absolute: 0 10*3/uL (ref 0.0–0.1)
Basophils Relative: 1 %
Eosinophils Absolute: 0.3 10*3/uL (ref 0.0–0.5)
Eosinophils Relative: 4 %
HCT: 37.2 % (ref 36.0–46.0)
Hemoglobin: 12.1 g/dL (ref 12.0–15.0)
Immature Granulocytes: 0 %
Lymphocytes Relative: 27 %
Lymphs Abs: 2.2 10*3/uL (ref 0.7–4.0)
MCH: 31.2 pg (ref 26.0–34.0)
MCHC: 32.5 g/dL (ref 30.0–36.0)
MCV: 95.9 fL (ref 80.0–100.0)
Monocytes Absolute: 1.2 10*3/uL — ABNORMAL HIGH (ref 0.1–1.0)
Monocytes Relative: 15 %
Neutro Abs: 4.3 10*3/uL (ref 1.7–7.7)
Neutrophils Relative %: 53 %
Platelets: 160 10*3/uL (ref 150–400)
RBC: 3.88 MIL/uL (ref 3.87–5.11)
RDW: 14.4 % (ref 11.5–15.5)
WBC: 8.1 10*3/uL (ref 4.0–10.5)
nRBC: 0 % (ref 0.0–0.2)

## 2019-10-12 LAB — BASIC METABOLIC PANEL
Anion gap: 7 (ref 5–15)
BUN: 18 mg/dL (ref 8–23)
CO2: 33 mmol/L — ABNORMAL HIGH (ref 22–32)
Calcium: 8.4 mg/dL — ABNORMAL LOW (ref 8.9–10.3)
Chloride: 100 mmol/L (ref 98–111)
Creatinine, Ser: 0.43 mg/dL — ABNORMAL LOW (ref 0.44–1.00)
GFR calc Af Amer: >60 mL/min (ref >60)
GFR calc non Af Amer: >60 mL/min (ref >60)
Glucose, Bld: 92 mg/dL (ref 70–99)
Potassium: 4 mmol/L (ref 3.5–5.1)
Sodium: 140 mmol/L (ref 135–145)

## 2019-10-12 LAB — LEGIONELLA PNEUMOPHILA SEROGP 1 UR AG: L. pneumophila Serogp 1 Ur Ag: NEGATIVE

## 2019-10-12 LAB — PROTIME-INR
INR: 4.1 (ref 0.8–1.2)
Prothrombin Time: 40 seconds — ABNORMAL HIGH (ref 11.4–15.2)

## 2019-10-12 MED ORDER — CALTRATE 600+D PLUS MINERALS 600-800 MG-UNIT PO TABS: 2.0000 | ORAL_TABLET | Freq: Two times a day (BID) | ORAL | Status: DC

## 2019-10-12 MED ORDER — ALPRAZOLAM 0.5 MG PO TABS
0.5000 mg | ORAL_TABLET | Freq: Every evening | ORAL | Status: DC | PRN
  Administered 2019-10-12 – 2019-10-14 (×3): 0.5 mg via ORAL
  Filled 2019-10-12 (×4): qty 1

## 2019-10-12 MED ORDER — BISACODYL 5 MG PO TBEC
10.0000 mg | DELAYED_RELEASE_TABLET | Freq: Once | ORAL | Status: AC
  Administered 2019-10-12: 10 mg via ORAL
  Filled 2019-10-12: qty 2

## 2019-10-12 MED ORDER — BACLOFEN 20 MG PO TABS
20.0000 mg | ORAL_TABLET | Freq: Two times a day (BID) | ORAL | Status: DC | PRN
  Administered 2019-10-14 – 2019-10-17 (×3): 20 mg via ORAL
  Filled 2019-10-12 (×3): qty 1

## 2019-10-12 MED ORDER — CALCIUM CARBONATE-VITAMIN D 500-200 MG-UNIT PO TABS
1.0000 | ORAL_TABLET | Freq: Two times a day (BID) | ORAL | Status: DC
  Administered 2019-10-12 – 2019-10-17 (×11): 1 via ORAL
  Filled 2019-10-12 (×11): qty 1

## 2019-10-12 MED ORDER — VITAMIN D3 25 MCG (1000 UNIT) PO TABS
2000.0000 [IU] | ORAL_TABLET | Freq: Two times a day (BID) | ORAL | Status: DC
  Administered 2019-10-12 – 2019-10-17 (×11): 2000 [IU] via ORAL
  Filled 2019-10-12 (×11): qty 2

## 2019-10-12 MED ORDER — ASCORBIC ACID 500 MG PO TABS
500.0000 mg | ORAL_TABLET | Freq: Every day | ORAL | Status: DC
  Administered 2019-10-12 – 2019-10-17 (×6): 500 mg via ORAL
  Filled 2019-10-12 (×6): qty 1

## 2019-10-12 MED ORDER — BACLOFEN 20 MG PO TABS: 20.0000 mg | ORAL_TABLET | Freq: Three times a day (TID) | ORAL | Status: DC | PRN

## 2019-10-12 NOTE — Progress Notes (Signed)
[place] for warfarin Indication: hx atrial fibrillation  Allergies  Allergen Reactions  . Chlorhexidine Hives  . Hydrocodone Nausea And Vomiting    Hydrocodone causes vomiting  . Oxycodone Nausea And Vomiting    Oral oxycodone causes vomiting  . Chlorhexidine Gluconate Rash    Sores at site  . Zoloft [Sertraline] Hives, Swelling and Rash   Patient Measurements: Height: 5\' 3"  (160 cm) Weight: 188 lb 7.9 oz (85.5 kg) IBW/kg (Calculated) : 52.4  Vital Signs: Temp: 97.7 F (36.5 C) (02/21 0524) Temp Source: Oral (02/21 0524) BP: 97/62 (02/21 0524) Pulse Rate: 66 (02/21 0524)  Labs: Recent Labs    10/10/19 0044 10/10/19 0044 10/11/19 0256 10/12/19 0358  HGB 13.3   < > 11.5* 12.1  HCT 41.0  --  35.8* 37.2  PLT 182  --  157 160  LABPROT 30.4*  --  42.7* 40.0*  INR 2.9*  --  4.5* 4.1*  CREATININE 0.49  --  0.36* 0.43*   < > = values in this interval not displayed.   Estimated Creatinine Clearance: 65.8 mL/min (A) (by C-G formula based on SCr of 0.43 mg/dL (L)).  Medications:  PTA regimen: 5mg  daily except 2.5 mg on Mondays and Wednesdays--> per [facility] clinic visit on 10/08/2019.  However, patient said that since she got discharged from rehab facility (10/07/19), she has been using their medication dose packs with a warfarin regimen of 4mg  daily except 4.5 mg on Mon and Fri  Assessment: 74 y.o F with hx afib on warfarin on admission presented to the ED on 2/18 with ShOB.  Warfarin resumed on admission.  Today, 10/12/2019: - INR still SUPRAtherapeutic 4.1 - CBC stable - drug-drug intxns: abx can make pt more sensitive to warfarin, Current Azithromycin, Cefepime - no reported bleeding  Goal of Therapy:  INR 2-3 Monitor platelets by anticoagulation protocol: Yes   Plan:  No Warfarin again today Daily Protime/INR Monitor po intake, s/s bleed   Adrian Saran, PharmD, BCPS 10/12/2019 10:21 AM

## 2019-10-12 NOTE — Progress Notes (Signed)
PROGRESS NOTE    Toni [name]  [id number] DOB: 1946/03/08 DOA: 10/09/2019 PCP: Prince Solian, MD    Brief Narrative:  Patient was admitted to the hospital with a working diagnosis of acute hypoxic respiratory failure due to pneumonia, complicated by severe sepsis.  74 year old female who presented with dyspnea.  She does have significant past medical history for paroxysmal atrial fibrillation, hypothyroidism, multiple sclerosis (wheelchair bound for the last 3 years) and obstructive sleep apnea.  Recent hospitalization 08/22/19 to 08/29/19 for [id number] infection complicated by hypotension and urinary tract infection.  Discharged to skilled nursing facility. From SNF she was discharged home on 10/07/19.   2 days prior to hospitalization she developed dyspnea, associated with subjective fevers but with no cough or congestion. EMS was called and his oximetry was 80% on room air. Physical examination her blood pressure was 126/77, heart rate 76-97, respiratory rate 17, oxygen saturation 98%, she had moist mucous membranes, her lungs had no wheezing or rails, heart S1-S2, present rhythm, soft abdomen, non pitting lower extremity edema. Sodium 142, potassium 3.8, chloride 100, bicarb 33, glucose 92, BUN 19, creatinine 0.44, white count 13.4, hemoglobin 14.3, hematocrit 44.0, platelets 197.  Urine analysis negative for infection.  Chest radiograph had bilateral hilar congestion, reticulonodular infiltrate at the right base/right middle lobe.  EKG 135 bpm, normal axis, normal QRS, atrial fibrillation rhythm, no ST segment or T wave changes.   Patient received supplemental oxygen per nonrebreather mask, and broad spectrum antibiotic therapy, along with diuresis with furosemide.  Oxygen requirements have improved and patient was weaned off to nasal cannula.    Assessment & Plan:   Principal Problem:   HCAP (healthcare-associated pneumonia) Active Problems:   Long term (current) use  of anticoagulants   Atrial fibrillation with RVR (HCC)   Obstructive sleep apnea treated with bilevel positive airway pressure (BiPAP)   Acute respiratory failure with hypoxia (HCC)   Severe sepsis ([place])   1. Acute hypoxic respiratory failure due to right middle lobe pneumonia, complicated with sepsis (end-organ damage hypoxemia)/ present on admission.  Oxygen requirements between 3 and 4 L/ min  per [place], stable wbc at 8,1 and cultures continue with no growth.  Continue antibiotic therapy with oral azithromycin and IV ceftriaxone.   Out of bed to chair as tolerated, PT and OT. Patient is wheelchair bound. Has home health services and has declined SNF.   2. Atrial fibrillation with RVR. Tolerating well oral diltiazem, 180 mg daily, HR 66 to 87 bpm. Will continue anticoagulation with warfarin per pharmacy protocol. INR today down to 4.1 from 4,5.   3. Acute on chronic diastolic heart failure. Sp diuresis with IV furosemide. Today patient euvolemic, will continue oral furosemide and blood pressure monitoring. Continue to hold on midodrine for now.  4. Hypothyroid. On levothyroxine.  5. Multiple sclerosis/ sp baclofen pump. No clinical signs of exacerbation,  wheelchair bound for about 3 years. Will resume home health services at discharge. Will resume as needed po baclofen. Continue bupropion. Add dulcolax to bowel regimen.   6. Dyslipidemia. On simvastatin.   DVT prophylaxis: warfarin   Code Status:  DNR  Family Communication: I spoke over the phone with the patient's daughter about patient's  condition, plan of care, prognosis and all questions were addressed. Disposition Plan/ discharge barriers: transfer to telemetry.     Antimicrobials:   Azithromycin   Ceftriaxone     Subjective: Patient is feeling better, not able to sleep last night due to cpap interface, no nausea  or vomiting, dyspnea continue to improve, no chest pain. Continue to have constipation.    Objective: Vitals:   10/11/19 1953 10/11/19 2058 10/12/19 0500 10/12/19 0524  BP:  (!) 94/28  97/62  Pulse: 72 64  66  Resp: 18 18  18   Temp:  98.8 F (37.1 C)  97.7 F (36.5 C)  TempSrc:  Oral  Oral  SpO2: 96% 100%  95%  Weight:   85.5 kg   Height:        Intake/Output Summary (Last 24 hours) at 10/12/2019 0953 Last data filed at 10/12/2019 0828 Gross per 24 hour  Intake 463 ml  Output --  Net 463 ml   Filed Weights   10/10/19 0400 10/11/19 0419 10/12/19 0500  Weight: 87 kg 87 kg 85.5 kg    Examination:   General: Not in pain or dyspnea. Deconditioned  Neurology: Awake and alert, non focal  E ENT: mild pallor, no icterus, oral mucosa moist Cardiovascular: No JVD. S1-S2 present, rhythmic, no gallops, rubs, or murmurs. Non pitting lower extremity edema ++. Pulmonary: positive breath sounds bilaterally. Gastrointestinal. Abdomen protuberant with no organomegaly, non tender, no rebound or guarding Skin. No rashes Musculoskeletal: no joint deformities     Data Reviewed: I have personally reviewed following labs and imaging studies  CBC: Recent Labs  Lab 10/09/19 1557 10/10/19 0044 10/11/19 0256 10/12/19 0358  WBC 13.4* 13.1* 7.8 8.1  NEUTROABS 9.9* 8.8*  --  4.3  HGB 14.3 13.3 11.5* 12.1  HCT 44.0 41.0 35.8* 37.2  MCV 95.7 95.8 96.0 95.9  PLT 197 182 157 0000000   Basic Metabolic Panel: Recent Labs  Lab 10/09/19 1557 10/09/19 1943 10/10/19 0044 10/11/19 0256 10/12/19 0358  NA 142  --  139 138 140  K 3.8  --  3.8 3.6 4.0  CL 100  --  97* 100 100  CO2 33*  --  32 30 33*  GLUCOSE 92  --  88 84 92  BUN 19  --  17 23 18   CREATININE 0.44  --  0.49 0.36* 0.43*  CALCIUM 8.9  --  8.6* 8.2* 8.4*  MG  --  2.1 1.9 2.1  --   PHOS  --   --  3.2 3.3  --    GFR: Estimated Creatinine Clearance: 65.8 mL/min (A) (by C-G formula based on SCr of 0.43 mg/dL (L)). Liver Function Tests: Recent Labs  Lab 10/09/19 1557 10/10/19 0044 10/11/19 0256  AST 15 15 13*   ALT 21 17 16   ALKPHOS 59 52 44  BILITOT 0.9 1.2 0.8  PROT 6.4* 6.0* 5.5*  ALBUMIN 3.6 3.3* 3.0*   No results for input(s): LIPASE, AMYLASE in the last 168 hours. No results for input(s): AMMONIA in the last 168 hours. Coagulation Profile: Recent Labs  Lab 10/08/19 0000 10/09/19 1557 10/10/19 0044 10/11/19 0256 10/12/19 0358  INR 2.9 2.6* 2.9* 4.5* 4.1*   Cardiac Enzymes: No results for input(s): CKTOTAL, CKMB, CKMBINDEX, TROPONINI in the last 168 hours. BNP (last 3 results) No results for input(s): PROBNP in the last 8760 hours. HbA1C: No results for input(s): HGBA1C in the last 72 hours. CBG: No results for input(s): GLUCAP in the last 168 hours. Lipid Profile: No results for input(s): CHOL, HDL, LDLCALC, TRIG, CHOLHDL, LDLDIRECT in the last 72 hours. Thyroid Function Tests: Recent Labs    10/10/19 0044  TSH 1.374   Anemia Panel: No results for input(s): VITAMINB12, FOLATE, FERRITIN, TIBC, IRON, RETICCTPCT in the last 72 hours.  Radiology Studies: I have reviewed all of the imaging during this hospital visit personally     Scheduled Meds: . azithromycin  500 mg Oral Daily  . buPROPion  300 mg Oral Daily  . diltiazem  180 mg Oral Daily  . docusate sodium  100 mg Oral Daily  . famotidine  20 mg Oral QHS  . furosemide  20 mg Oral Daily  . gabapentin  300 mg Oral TID  . levothyroxine  75 mcg Oral Daily  . mouth rinse  15 mL Mouth Rinse BID  . pantoprazole  40 mg Oral Daily  . polyethylene glycol  17 g Oral Daily  . simvastatin  10 mg Oral QPM  . sodium chloride flush  3 mL Intravenous Q12H  . Warfarin - Pharmacist Dosing Inpatient   Does not apply q1800   Continuous Infusions: . cefTRIAXone (ROCEPHIN)  IV 1 g (10/12/19 0827)     LOS: 3 days        Fitzgerald Dunne Gerome Apley, MD

## 2019-10-12 NOTE — Evaluation (Signed)
Physical Therapy Evaluation Patient Details Name: Toni [name] MRN: [id number] DOB: [birth year] Today's Date: 10/12/2019   History of Present Illness  Pt admitted with resp failure with sepsis 2* PNA;  Pt recently dc for rehab setting at [facility] following COVID dx in January.  Pt with hx of MS, PAF, A-fib with RVR, s/p cervical fusion, back surgery and Baclofen INfusion Pain pump implantation  Clinical Impression  Pt admitted as above and presenting with functional mobility limitations 2* generalized weakness but with R side more affected by MS and balance deficits.  This date pt up to side of bed with assist and balanced in bed side sitting with intermittent use of UEs for ~ 12 min.  Transfer to chair deferred this date and pt transferred back to bed requesting bedpan - pt reports recent laxative.  Pt should progress to transition home with assist of spouse and hired care-givers.    Follow Up Recommendations Home health PT    Equipment Recommendations  None recommended by PT    Recommendations for Other Services OT consult     Precautions / Restrictions Precautions Precautions: Fall Restrictions Weight Bearing Restrictions: No      Mobility  Bed Mobility Overal bed mobility: Needs Assistance Bed Mobility: Supine to Sit;Sit to Supine     Supine to sit: +2 for physical assistance;Min assist;Mod assist Sit to supine: Min assist;Mod assist;+2 for physical assistance;+2 for safety/equipment   General bed mobility comments: Increased time, use of bed rail, physical assist to manage LEs and trunk  Transfers Overall transfer level: Needs assistance Equipment used: 2 person hand held assist Transfers: Lateral/Scoot Transfers          Lateral/Scoot Transfers: Min assist;Mod assist;+2 physical assistance General transfer comment: lateral/squat transfer technique to move up side of bed  Ambulation/Gait                Stairs            Wheelchair Mobility     Modified Rankin (Stroke Patients Only)       Balance Overall balance assessment: Needs assistance Sitting-balance support: No upper extremity supported Sitting balance-Leahy Scale: Fair Sitting balance - Comments: increased use of UEs with increased fatigue                                     Pertinent Vitals/Pain Pain Assessment: No/denies pain    Home Living Family/patient expects to be discharged to:: Private residence Living Arrangements: Spouse/significant other Available Help at Discharge: Family;Available 24 hours/day Type of Home: House Home Access: Ramped entrance     Home Layout: Two level;Able to live on main level with bedroom/bathroom Home Equipment: Wheelchair - manual;Grab bars - tub/shower;Bedside commode;Hand held shower head;Hospital bed;Other (comment)(lift equipment) Additional Comments: Hired assist 7 days a week    Prior Function Level of Independence: Needs assistance   Gait / Transfers Assistance Needed: Pt reports using sliding board for transfers or lift equipment.           Hand Dominance   Dominant Hand: Right    Extremity/Trunk Assessment   Upper Extremity Assessment Upper Extremity Assessment: Generalized weakness    Lower Extremity Assessment Lower Extremity Assessment: Generalized weakness;RLE deficits/detail RLE Deficits / Details: Bil generalized weakness with R LE most affected    Cervical / Trunk Assessment Cervical / Trunk Assessment: Other exceptions(fused cervical spine)  Communication   Communication: No difficulties  Cognition Arousal/Alertness:  Awake/alert Behavior During Therapy: WFL for tasks assessed/performed Overall Cognitive Status: Within Functional Limits for tasks assessed                                        General Comments      Exercises     Assessment/Plan    PT Assessment Patient needs continued PT services  PT Problem List Decreased strength;Decreased  activity tolerance;Decreased balance;Decreased mobility;Decreased knowledge of use of DME;Obesity       PT Treatment Interventions DME instruction;Gait training;Functional mobility training;Therapeutic activities;Therapeutic exercise;Balance training;Patient/family education    PT Goals (Current goals can be found in the Care Plan section)  Acute Rehab PT Goals Patient Stated Goal: Regain IND PT Goal Formulation: With patient Time For Goal Achievement: 10/26/19 Potential to Achieve Goals: Fair    Frequency Min 3X/week   Barriers to discharge        Co-evaluation PT/OT/SLP Co-Evaluation/Treatment: Yes Reason for Co-Treatment: Complexity of the patient's impairments (multi-system involvement);For patient/therapist safety PT goals addressed during session: Mobility/safety with mobility OT goals addressed during session: ADL's and self-care       AM-PAC PT "6 Clicks" Mobility  Outcome Measure Help needed turning from your back to your side while in a flat bed without using bedrails?: A Little Help needed moving from lying on your back to sitting on the side of a flat bed without using bedrails?: A Lot Help needed moving to and from a bed to a chair (including a wheelchair)?: A Lot Help needed standing up from a chair using your arms (e.g., wheelchair or bedside chair)?: A Lot Help needed to walk in hospital room?: Total Help needed climbing 3-5 steps with a railing? : Total 6 Click Score: 11    End of Session Equipment Utilized During Treatment: Oxygen Activity Tolerance: Patient tolerated treatment well;Patient limited by fatigue Patient left: in bed;with call bell/phone within reach;with bed alarm set Nurse Communication: Mobility status PT Visit Diagnosis: Difficulty in walking, not elsewhere classified (R26.2);Muscle weakness (generalized) (M62.81);Other symptoms and signs involving the nervous system (R29.898)    Time: [id number] PT Time Calculation (min) (ACUTE ONLY):  30 min   Charges:   PT Evaluation $PT Eval Moderate Complexity: [place] Pager [phone] Office [phone]   Dakota Vanwart 10/12/2019, 5:56 PM

## 2019-10-12 NOTE — Evaluation (Signed)
Occupational Therapy Evaluation Patient Details Name: Toni [name] MRN: [id number] DOB: [birth year] Today's Date: 10/12/2019    History of Present Illness Pt admitted with resp failure with sepsis 2* PNA;  Pt recently dc for rehab setting at [facility] following COVID dx in January.  Pt with hx of MS, PAF, A-fib with RVR, s/p cervical fusion, back surgery and Baclofen INfusion Pain pump implantation   Clinical Impression   Pt admitted with sepsis. Pt currently with functional limitations due to the deficits listed below (see OT Problem List).  Pt will benefit from skilled OT to increase their safety and independence with ADL and functional mobility for ADL to facilitate discharge to venue listed below.      Follow Up Recommendations  Home health OT;Supervision/Assistance - 24 hour    Equipment Recommendations  None recommended by OT    Recommendations for Other Services       Precautions / Restrictions Precautions Precautions: Fall Restrictions Weight Bearing Restrictions: No      Mobility Bed Mobility Overal bed mobility: Needs Assistance Bed Mobility: Supine to Sit;Sit to Supine     Supine to sit: +2 for physical assistance;Min assist;Mod assist Sit to supine: Min assist;Mod assist;+2 for physical assistance;+2 for safety/equipment   General bed mobility comments: Increased time, use of bed rail, physical assist to manage LEs and trunk  Transfers Overall transfer level: Needs assistance Equipment used: 2 person hand held assist Transfers: Lateral/Scoot Transfers          Lateral/Scoot Transfers: Min assist;Mod assist;+2 physical assistance General transfer comment: lateral/squat transfer technique to move up side of bed    Balance Overall balance assessment: Needs assistance Sitting-balance support: No upper extremity supported Sitting balance-Leahy Scale: Fair Sitting balance - Comments: increased use of UEs with increased fatigue                                    ADL either performed or assessed with clinical judgement   ADL Overall ADL's : Needs assistance/impaired Eating/Feeding: Minimal assistance;Sitting   Grooming: Minimal assistance;Sitting   Upper Body Bathing: Minimal assistance;Sitting   Lower Body Bathing: Sitting/lateral leans;+2 for physical assistance;+2 for safety/equipment;Cueing for safety;Cueing for sequencing   Upper Body Dressing : Minimal assistance;Sitting   Lower Body Dressing: Sitting/lateral leans;Bed level;+2 for physical assistance;+2 for safety/equipment;Total assistance                 General ADL Comments: Pt sat EOB with OT and PT.  Pt very participative and hopeful to return home     Vision Patient Visual Report: No change from baseline              Pertinent Vitals/Pain Pain Assessment: No/denies pain     Hand Dominance Right   Extremity/Trunk Assessment Upper Extremity Assessment Upper Extremity Assessment: Generalized weakness   Lower Extremity Assessment Lower Extremity Assessment: Generalized weakness;RLE deficits/detail RLE Deficits / Details: Bil generalized weakness with R LE most affected   Cervical / Trunk Assessment Cervical / Trunk Assessment: Other exceptions(fused cervical spine)   Communication Communication Communication: No difficulties   Cognition Arousal/Alertness: Awake/alert Behavior During Therapy: WFL for tasks assessed/performed Overall Cognitive Status: Within Functional Limits for tasks assessed  Home Living Family/patient expects to be discharged to:: Private residence Living Arrangements: Spouse/significant other Available Help at Discharge: Family;Available 24 hours/day Type of Home: House Home Access: Ramped entrance     Home Layout: Two level;Able to live on main level with bedroom/bathroom     Bathroom Shower/Tub: Other (comment)(barrier free shower)    Bathroom Toilet: Handicapped height Bathroom Accessibility: Yes   Home Equipment: Wheelchair - manual;Grab bars - tub/shower;Bedside commode;Hand held shower head;Hospital bed;Other (comment)(lift equipment)   Additional Comments: Hired assist 7 days a week      Prior Functioning/Environment Level of Independence: Needs assistance  Gait / Transfers Assistance Needed: Pt reports using sliding board for transfers or lift equipment.              OT Problem List: Decreased strength;Decreased activity tolerance;Impaired balance (sitting and/or standing);Decreased safety awareness;Decreased knowledge of use of DME or AE      OT Treatment/Interventions: Self-care/ADL training;Patient/family education;DME and/or AE instruction    OT Goals(Current goals can be found in the care plan section) Acute Rehab OT Goals Patient Stated Goal: Regain IND  OT Frequency: Min 2X/week   Barriers to D/C:            Co-evaluation   Reason for Co-Treatment: For patient/therapist safety PT goals addressed during session: Mobility/safety with mobility OT goals addressed during session: ADL's and self-care      AM-PAC OT "6 Clicks" Daily Activity     Outcome Measure Help from another person eating meals?: A Little Help from another person taking care of personal grooming?: A Little Help from another person toileting, which includes using toliet, bedpan, or urinal?: Total Help from another person bathing (including washing, rinsing, drying)?: A Lot Help from another person to put on and taking off regular upper body clothing?: A Lot Help from another person to put on and taking off regular lower body clothing?: Total 6 Click Score: 12   End of Session Nurse Communication: Mobility status  Activity Tolerance: Patient tolerated treatment well Patient left: in chair;with call bell/phone within reach  OT Visit Diagnosis: Unsteadiness on feet (R26.81);Muscle weakness (generalized)  (M62.81);History of falling (Z91.81);Repeated falls (R29.6);Other abnormalities of gait and mobility (R26.89)                Time: [id number] OT Time Calculation (min): 29 min Charges:  OT General Charges $OT Visit: 1 Visit OT Evaluation $OT Eval Moderate Complexity: 1 [place], [place] [phone] Office- [phone], Edwena Felty D 10/12/2019, 6:22 PM

## 2019-10-13 LAB — BASIC METABOLIC PANEL
Anion gap: 8 (ref 5–15)
BUN: 15 mg/dL (ref 8–23)
CO2: 32 mmol/L (ref 22–32)
Calcium: 8.8 mg/dL — ABNORMAL LOW (ref 8.9–10.3)
Chloride: 101 mmol/L (ref 98–111)
Creatinine, Ser: 0.44 mg/dL (ref 0.44–1.00)
GFR calc Af Amer: >60 mL/min (ref >60)
GFR calc non Af Amer: >60 mL/min (ref >60)
Glucose, Bld: 78 mg/dL (ref 70–99)
Potassium: 4.1 mmol/L (ref 3.5–5.1)
Sodium: 141 mmol/L (ref 135–145)

## 2019-10-13 LAB — CBC WITH DIFFERENTIAL/PLATELET
Abs Immature Granulocytes: 0.02 10*3/uL (ref 0.00–0.07)
Basophils Absolute: 0 10*3/uL (ref 0.0–0.1)
Basophils Relative: 0 %
Eosinophils Absolute: 0.4 10*3/uL (ref 0.0–0.5)
Eosinophils Relative: 5 %
HCT: 37.4 % (ref 36.0–46.0)
Hemoglobin: 12 g/dL (ref 12.0–15.0)
Immature Granulocytes: 0 %
Lymphocytes Relative: 34 %
Lymphs Abs: 2.7 10*3/uL (ref 0.7–4.0)
MCH: 31.1 pg (ref 26.0–34.0)
MCHC: 32.1 g/dL (ref 30.0–36.0)
MCV: 96.9 fL (ref 80.0–100.0)
Monocytes Absolute: 1 10*3/uL (ref 0.1–1.0)
Monocytes Relative: 13 %
Neutro Abs: 3.7 10*3/uL (ref 1.7–7.7)
Neutrophils Relative %: 48 %
Platelets: 179 10*3/uL (ref 150–400)
RBC: 3.86 MIL/uL — ABNORMAL LOW (ref 3.87–5.11)
RDW: 14.1 % (ref 11.5–15.5)
WBC: 7.8 10*3/uL (ref 4.0–10.5)
nRBC: 0 % (ref 0.0–0.2)

## 2019-10-13 LAB — PROTIME-INR
INR: 2.3 — ABNORMAL HIGH (ref 0.8–1.2)
Prothrombin Time: 25.3 seconds — ABNORMAL HIGH (ref 11.4–15.2)

## 2019-10-13 MED ORDER — WARFARIN SODIUM 4 MG PO TABS
4.0000 mg | ORAL_TABLET | Freq: Once | ORAL | Status: AC
  Administered 2019-10-13: 4 mg via ORAL
  Filled 2019-10-13: qty 1

## 2019-10-13 NOTE — Progress Notes (Signed)
[place] for warfarin Indication: hx atrial fibrillation  Allergies  Allergen Reactions  . Chlorhexidine Hives  . Hydrocodone Nausea And Vomiting    Hydrocodone causes vomiting  . Oxycodone Nausea And Vomiting    Oral oxycodone causes vomiting  . Chlorhexidine Gluconate Rash    Sores at site  . Zoloft [Sertraline] Hives, Swelling and Rash   Patient Measurements: Height: 5\' 3"  (160 cm) Weight: 186 lb 8 oz (84.6 kg) IBW/kg (Calculated) : 52.4  Vital Signs: Temp: 97.7 F (36.5 C) (02/22 0607) Temp Source: Oral (02/22 0607) BP: 100/50 (02/22 0607) Pulse Rate: 64 (02/22 0607)  Labs: Recent Labs    10/11/19 0256 10/11/19 0256 10/12/19 0358 10/13/19 0345  HGB 11.5*   < > 12.1 12.0  HCT 35.8*  --  37.2 37.4  PLT 157  --  160 179  LABPROT 42.7*  --  40.0* 25.3*  INR 4.5*  --  4.1* 2.3*  CREATININE 0.36*  --  0.43* 0.44   < > = values in this interval not displayed.   Estimated Creatinine Clearance: 65.5 mL/min (by C-G formula based on SCr of 0.44 mg/dL).  Medications:  PTA regimen: 5mg  daily except 2.5 mg on Mondays and Wednesdays--> per [facility] clinic visit on 10/08/2019.  However, patient said that since she got discharged from rehab facility (10/07/19), she has been using their medication dose packs with a warfarin regimen of 4mg  daily except 4.5 mg on Mon and Fri  Assessment: 74 y.o F with hx afib on warfarin on admission presented to the ED on 2/18 with ShOB.  Warfarin resumed on admission.  Today, 10/13/2019: - INR 2.3 therapeutic, warfarin held for the past 2 days - CBC stable - drug-drug intxns: abx can make pt more sensitive to warfarin, Current Azithromycin, Ceftriaxone - no reported bleeding  Goal of Therapy:  INR 2-3 Monitor platelets by anticoagulation protocol: Yes   Plan:  Warfarin 4mg  po today at 1800 x 1 Daily Protime/INR Monitor po intake, s/s bleed   Dolly Rias RPh 10/13/2019, 11:26 AM

## 2019-10-13 NOTE — Progress Notes (Signed)
Occupational Therapy Treatment Patient Details Name: Toni [name] MRN: [id number] DOB: 1946/02/24 Today's Date: 10/13/2019    History of present illness Pt admitted with resp failure with sepsis 2* PNA;  Pt recently dc for rehab setting at [facility] following [place] dx in January.  Pt with hx of MS, PAF, A-fib with RVR, s/p cervical fusion, back surgery and Baclofen INfusion Pain pump implantation   OT comments  Pt with good participation !  Will attempt getting to chair with STEADY next day. Will need wide steady and it was not available this day   Follow Up Recommendations  Home health OT;Supervision/Assistance - 24 hour    Equipment Recommendations  None recommended by OT    Recommendations for Other Services      Precautions / Restrictions Precautions Precautions: Fall Restrictions Weight Bearing Restrictions: No       Mobility Bed Mobility Overal bed mobility: Needs Assistance Bed Mobility: Supine to Sit;Sit to Supine     Supine to sit: Min assist Sit to supine: Mod assist;+2 for physical assistance   General bed mobility comments: Increased time, use of bed rail, physical assist to manage LEs and trunk  Transfers Overall transfer level: Needs assistance Equipment used: None Transfers: Sit to/from Stand Sit to Stand: +2 physical assistance;Max assist;From elevated surface         General transfer comment: using STEADY    Balance Overall balance assessment: Needs assistance Sitting-balance support: No upper extremity supported Sitting balance-Leahy Scale: Fair Sitting balance - Comments: increased use of UEs with increased fatigue                                   ADL either performed or assessed with clinical judgement   ADL Overall ADL's : Needs assistance/impaired Eating/Feeding: Minimal assistance;Sitting   Grooming: Minimal assistance;Sitting                                 General ADL Comments: pt performed sit  to stand 3 times with steady.  pt MAX A of 2 but able to stand upright for 30 sec 3 times.               Cognition Arousal/Alertness: Awake/alert Behavior During Therapy: WFL for tasks assessed/performed Overall Cognitive Status: Within Functional Limits for tasks assessed                                               Frequency  Min 2X/week        Progress Toward Goals  OT Goals(current goals can now be found in the care plan section)  Progress towards OT goals: Progressing toward goals     Plan Discharge plan remains appropriate       AM-PAC OT "6 Clicks" Daily Activity     Outcome Measure   Help from another person eating meals?: A Little Help from another person taking care of personal grooming?: A Little Help from another person toileting, which includes using toliet, bedpan, or urinal?: Total Help from another person bathing (including washing, rinsing, drying)?: A Lot Help from another person to put on and taking off regular upper body clothing?: A Lot Help from another person to put on and taking off regular lower body  clothing?: Total 6 Click Score: 12    End of Session Equipment Utilized During Treatment: Other (comment)(STEADY)  OT Visit Diagnosis: Unsteadiness on feet (R26.81);Muscle weakness (generalized) (M62.81);History of falling (Z91.81);Repeated falls (R29.6);Other abnormalities of gait and mobility (R26.89)   Activity Tolerance Patient tolerated treatment well   Patient Left with call bell/phone within reach;in bed   Nurse Communication Mobility status        Time: 0330-0400 OT Time Calculation (min): 30 min  Charges: OT General Charges $OT Visit: 1 Visit OT Treatments $Self Care/Home Management : 23-37 mins  Kari Baars, [place] [phone] Office- [phone], Edwena Felty D 10/13/2019, 5:42 PM

## 2019-10-13 NOTE — Progress Notes (Signed)
PROGRESS NOTE    Toni [name]  [id number] DOB: Dec 24, 1945 DOA: 10/09/2019 PCP: Prince Solian, MD   Brief Narrative:  Patient was admitted to the hospital with a working diagnosis of acute hypoxic respiratory failure due to pneumonia, complicated by severe sepsis.  This 74 year old female who presented with dyspnea. She does have significant past medical history for paroxysmal atrial fibrillation, hypothyroidism, multiple sclerosis (wheelchair bound for the last 3 years)and obstructive sleep apnea. Recent hospitalization01/1/21 to 01/08/21for [id number] infection complicated by hypotension and urinary tract infection. Discharged to skilled nursing facility. From SNF she was discharged homeon 10/07/19. Two days prior to hospitalization she developed dyspnea,associated with subjective fevers but with no coughor congestion.EMS was called and her pulse oximetry was 80% on room air. Physical examination her blood pressure was 126/77, heart rate 76-97, respiratory rate 17, oxygen saturation 98%, she had moist mucous membranes, her lungs had no wheezing or rales, heart S1-S2, present rhythm, soft abdomen, non pittinglower extremity edema. Urine analysis negative for infection. Chest radiograph had bilateral hilar congestion, reticulonodular infiltrate at the right base/right middle lobe. Patient received supplemental oxygen per nonrebreather mask,andbroad spectrum antibiotic therapy,along with diuresis with furosemide. Oxygen requirements have improved and patient was weaned off to nasal cannula.    Assessment & Plan:   Principal Problem:   HCAP (healthcare-associated pneumonia) Active Problems:   Long term (current) use of anticoagulants   Atrial fibrillation with RVR (HCC)   Obstructive sleep apnea treated with bilevel positive airway pressure (BiPAP)   Acute respiratory failure with hypoxia (HCC)   Severe sepsis ([place])  1. Acute hypoxic respiratory failure due to  right middle lobe pneumonia, complicated with sepsis (end-organ damage hypoxemia)/ present on admission. Oxygen requirements between 3 and 4 L/ min  per [place], stable wbc at 8.1 and cultures so far  no growth. Continue antibiotic therapy with oral azithromycin and IV ceftriaxone.  Out of bed to chair as tolerated, PT and OT. Patient is wheelchair bound. Has home health services and has declined SNF.   2. Atrial fibrillation with RVR. Tolerating well oral diltiazem, 180 mg daily, HR 66 to 87 bpm. Will continue anticoagulation with warfarin per pharmacy protocol. INR today down to 4.1 from 4,5.   She reports having palpitation last night, A. fib with RVR noted.  Cardizem given.  Heart rate controlled now.  3. Acute on chronic diastolic heart failure. s/ p diuresis with IV furosemide. Today patient euvolemic, will continue oral furosemide and blood pressure monitoring. Continue to hold on midodrine for now.  4. Hypothyroid. On levothyroxine.  5. Multiple sclerosis/ sp baclofen pump. No clinical signs of exacerbation,  wheelchair bound for about 3 years. Will resume home health services at discharge. Will resume as needed po baclofen. Continue bupropion. Add dulcolax to bowel regimen.  fecal impaction done, consider tap water enema.  6. Dyslipidemia. On simvastatin.  DVT prophylaxis:warfarin Code Status:DNR Family Communication:I spoke over the phone with the patient's daughter about patient's condition, plan of care, prognosis and all questions were addressed. Disposition Plan/ discharge barriers: Anticipated discharge home with HA once medically clear. Still has low BP/ generalized weakness.    Consultants:     Procedures:  None    Antimicrobials: Anti-infectives (From admission, onward)   Start     Dose/Rate Route Frequency Ordered Stop   10/11/19 1000  azithromycin (ZITHROMAX) tablet 500 mg     500 mg Oral Daily 10/11/19 0831     10/11/19 0900  cefTRIAXone (ROCEPHIN)  1 g in sodium chloride  0.9 % 100 mL IVPB     1 g 200 mL/hr over 30 Minutes Intravenous Every 24 hours 10/11/19 0831     10/10/19 1800  vancomycin (VANCOCIN) IVPB 1000 mg/200 mL premix  Status:  Discontinued     1,000 mg 200 mL/hr over 60 Minutes Intravenous Every 24 hours 10/09/19 2002 10/10/19 1201   10/10/19 0100  ceFEPIme (MAXIPIME) 2 g in sodium chloride 0.9 % 100 mL IVPB  Status:  Discontinued     2 g 200 mL/hr over 30 Minutes Intravenous Every 8 hours 10/09/19 2002 10/11/19 0831   10/09/19 2030  azithromycin (ZITHROMAX) 500 mg in sodium chloride 0.9 % 250 mL IVPB  Status:  Discontinued     500 mg 250 mL/hr over 60 Minutes Intravenous Every 24 hours 10/09/19 1943 10/11/19 0831   10/09/19 1700  vancomycin (VANCOREADY) IVPB 1500 mg/300 mL     1,500 mg 150 mL/hr over 120 Minutes Intravenous  Once 10/09/19 1646 10/09/19 2100   10/09/19 1645  ceFEPIme (MAXIPIME) 1 g in sodium chloride 0.9 % 100 mL IVPB  Status:  Discontinued     1 g 200 mL/hr over 30 Minutes Intravenous  Once 10/09/19 1634 10/09/19 1636   10/09/19 1645  ceFEPIme (MAXIPIME) 2 g in sodium chloride 0.9 % 100 mL IVPB     2 g 200 mL/hr over 30 Minutes Intravenous  Once 10/09/19 1637 10/09/19 1719      Subjective: Patient was seen and examined at bedside.  She reports feeling better, states she had palpitation last night because of her atrial fibrillation.  Feels better now.  She reports to have constipation because of multiple sclerosis,  asks for something.  Objective: Vitals:   10/12/19 1259 10/12/19 1906 10/12/19 2059 10/13/19 0607  BP: 111/67  108/76 (!) 100/50  Pulse: 87 81 (!) 106 64  Resp: 18  16 17   Temp: 99.4 F (37.4 C)  98.8 F (37.1 C) 97.7 F (36.5 C)  TempSrc: Oral  Oral Oral  SpO2: 97% 96% 98% 100%  Weight:    84.6 kg  Height:        Intake/Output Summary (Last 24 hours) at 10/13/2019 1140 Last data filed at 10/13/2019 1030 Gross per 24 hour  Intake 460 ml  Output 1350 ml  Net -890 ml   Filed  Weights   10/11/19 0419 10/12/19 0500 10/13/19 0607  Weight: 87 kg 85.5 kg 84.6 kg    Examination:  General exam: Appears calm and comfortable  Respiratory system: Clear to auscultation. Respiratory effort normal. Cardiovascular system: S1 & S2 heard, RRR. No JVD, murmurs, rubs, gallops or clicks. No pedal edema. Gastrointestinal system: Abdomen is nondistended, soft and nontender. No organomegaly or masses felt. Normal bowel sounds heard. Central nervous system: Alert and oriented. No focal neurological deficits. Extremities: Symmetric 5 x 5 power. Skin: No rashes, lesions or ulcers Psychiatry: Judgement and insight appear normal. Mood & affect appropriate.     Data Reviewed: I have personally reviewed following labs and imaging studies  CBC: Recent Labs  Lab 10/09/19 1557 10/10/19 0044 10/11/19 0256 10/12/19 0358 10/13/19 0345  WBC 13.4* 13.1* 7.8 8.1 7.8  NEUTROABS 9.9* 8.8*  --  4.3 3.7  HGB 14.3 13.3 11.5* 12.1 12.0  HCT 44.0 41.0 35.8* 37.2 37.4  MCV 95.7 95.8 96.0 95.9 96.9  PLT 197 182 157 160 0000000   Basic Metabolic Panel: Recent Labs  Lab 10/09/19 1557 10/09/19 1943 10/10/19 0044 10/11/19 0256 10/12/19 0358 10/13/19 0345  NA  142  --  139 138 140 141  K 3.8  --  3.8 3.6 4.0 4.1  CL 100  --  97* 100 100 101  CO2 33*  --  32 30 33* 32  GLUCOSE 92  --  88 84 92 78  BUN 19  --  17 23 18 15   CREATININE 0.44  --  0.49 0.36* 0.43* 0.44  CALCIUM 8.9  --  8.6* 8.2* 8.4* 8.8*  MG  --  2.1 1.9 2.1  --   --   PHOS  --   --  3.2 3.3  --   --    GFR: Estimated Creatinine Clearance: 65.5 mL/min (by C-G formula based on SCr of 0.44 mg/dL). Liver Function Tests: Recent Labs  Lab 10/09/19 1557 10/10/19 0044 10/11/19 0256  AST 15 15 13*  ALT 21 17 16   ALKPHOS 59 52 44  BILITOT 0.9 1.2 0.8  PROT 6.4* 6.0* 5.5*  ALBUMIN 3.6 3.3* 3.0*   No results for input(s): LIPASE, AMYLASE in the last 168 hours. No results for input(s): AMMONIA in the last 168  hours. Coagulation Profile: Recent Labs  Lab 10/09/19 1557 10/10/19 0044 10/11/19 0256 10/12/19 0358 10/13/19 0345  INR 2.6* 2.9* 4.5* 4.1* 2.3*   Cardiac Enzymes: No results for input(s): CKTOTAL, CKMB, CKMBINDEX, TROPONINI in the last 168 hours. BNP (last 3 results) No results for input(s): PROBNP in the last 8760 hours. HbA1C: No results for input(s): HGBA1C in the last 72 hours. CBG: No results for input(s): GLUCAP in the last 168 hours. Lipid Profile: No results for input(s): CHOL, HDL, LDLCALC, TRIG, CHOLHDL, LDLDIRECT in the last 72 hours. Thyroid Function Tests: No results for input(s): TSH, T4TOTAL, FREET4, T3FREE, THYROIDAB in the last 72 hours. Anemia Panel: No results for input(s): VITAMINB12, FOLATE, FERRITIN, TIBC, IRON, RETICCTPCT in the last 72 hours. Sepsis Labs: Recent Labs  Lab 10/09/19 1943 10/09/19 2114 10/10/19 0044  PROCALCITON <0.10  --   --   LATICACIDVEN  --  1.1 1.4    Recent Results (from the past 240 hour(s))  MRSA PCR Screening     Status: None   Collection Time: 10/09/19  7:46 PM   Specimen: Nasal Mucosa; Nasopharyngeal  Result Value Ref Range Status   MRSA by PCR NEGATIVE NEGATIVE Final    Comment:        The GeneXpert MRSA Assay (FDA approved for NASAL specimens only), is one component of a comprehensive MRSA colonization surveillance program. It is not intended to diagnose MRSA infection nor to guide or monitor treatment for MRSA infections. Performed at [facility], [place] [address] [place], [place] [address]   Culture, blood (Routine X 2) w Reflex to ID Panel     Status: None (Preliminary result)   Collection Time: 10/09/19  9:04 PM   Specimen: BLOOD  Result Value Ref Range Status   Specimen Description   Final    BLOOD BLOOD RIGHT WRIST Performed at [facility] [address], [place], [place] [address]    Special Requests   Final    BOTTLES DRAWN AEROBIC ONLY Blood Culture  results may not be optimal due to an excessive volume of blood received in culture bottles Performed at [facility] [address] [place], [place] [address]    Culture   Final    NO GROWTH 4 DAYS Performed at [facility], [place] [address] [place], [place] [address]    Report Status  PENDING  Incomplete  Culture, blood (Routine X 2) w Reflex to ID Panel     Status: None (Preliminary result)   Collection Time: 10/09/19  9:04 PM   Specimen: BLOOD  Result Value Ref Range Status   Specimen Description   Final    BLOOD BLOOD RIGHT ARM Performed at [facility] [address] [place], [place] [address]    Special Requests   Final    BOTTLES DRAWN AEROBIC AND ANAEROBIC Blood Culture results may not be optimal due to an excessive volume of blood received in culture bottles Performed at [facility] [address] [place], [place] [address]    Culture   Final    NO GROWTH 4 DAYS Performed at [facility], [place] [address], [place], [place] [address]    Report Status PENDING  Incomplete      Radiology Studies: No results found.  Scheduled Meds: . ascorbic acid  500 mg Oral Daily  . azithromycin  500 mg Oral Daily  . buPROPion  300 mg Oral Daily  . calcium-vitamin D  1 tablet Oral BID WC  . cholecalciferol  2,000 Units Oral BID  . diltiazem  180 mg Oral Daily  . docusate sodium  100 mg Oral Daily  . famotidine  20 mg Oral QHS  . furosemide  20 mg Oral Daily  . gabapentin  300 mg Oral TID  . levothyroxine  75 mcg Oral Daily  . mouth rinse  15 mL Mouth Rinse BID  . pantoprazole  40 mg Oral Daily  . polyethylene glycol  17 g Oral Daily  . simvastatin  10 mg Oral QPM  . sodium chloride flush  3 mL Intravenous Q12H  . warfarin  4 mg Oral ONCE-1800  . Warfarin - Pharmacist Dosing Inpatient   Does not apply q1800   Continuous Infusions: . cefTRIAXone (ROCEPHIN)  IV 1 g (10/13/19 0955)     LOS: 4 days     Time spent: 25 mins.    Shawna Clamp, MD [facility]   If 7PM-7AM, please contact night-coverage

## 2019-10-13 NOTE — TOC Progression Note (Signed)
Transition of Care [facility]) - Progression Note    Patient Details  Name: Toni [name] MRN: [id number] Date of Birth: 01-Apr-1946  [facility] CM/SW Contact  Purcell Mouton, RN Phone Number: 10/13/2019, 3:36 PM  Clinical Narrative:     Pt from home with HHRN/PT/OT and will need [place] orders with face to face. Pt was active with [facility] and will continue.   Expected Discharge Plan: [place] Barriers to Discharge: No Barriers Identified  Expected Discharge Plan and Services Expected Discharge Plan: [place]   Discharge Planning Services: CM Consult Post Acute Care Choice: [place] arrangements for the past 2 months: Single Family Home                           HH Arranged: RN, PT, OT [facility] Agency: [place] ([facility]) Date [facility]: 10/13/19 Time HH Agency Contacted: 0900 Representative spoke with at [place]: Santiago Glad, RN/Text   Social Determinants of Health ([place]) Interventions    Readmission Risk Interventions No flowsheet data found.

## 2019-10-13 NOTE — Care Management Important Message (Signed)
Important Message  Patient Details IM Letter given to Gabriel Earing RN Case Manager to present to the Patient Name: Toni [name] MRN: [id number] Date of Birth: 07/13/1946   Medicare Important Message Given:  Yes     Kerin Salen 10/13/2019, 10:03 AM

## 2019-10-14 ENCOUNTER — Inpatient Hospital Stay (HOSPITAL_COMMUNITY): Payer: Medicare Other

## 2019-10-14 ENCOUNTER — Telehealth: Payer: Self-pay | Admitting: Cardiovascular Disease

## 2019-10-14 LAB — MAGNESIUM: Magnesium: 2.1 mg/dL (ref 1.7–2.4)

## 2019-10-14 LAB — CBC
HCT: 37 % (ref 36.0–46.0)
Hemoglobin: 11.7 g/dL — ABNORMAL LOW (ref 12.0–15.0)
MCH: 31 pg (ref 26.0–34.0)
MCHC: 31.6 g/dL (ref 30.0–36.0)
MCV: 98.1 fL (ref 80.0–100.0)
Platelets: 181 10*3/uL (ref 150–400)
RBC: 3.77 MIL/uL — ABNORMAL LOW (ref 3.87–5.11)
RDW: 14.1 % (ref 11.5–15.5)
WBC: 7.2 10*3/uL (ref 4.0–10.5)
nRBC: 0 % (ref 0.0–0.2)

## 2019-10-14 LAB — COMPREHENSIVE METABOLIC PANEL
ALT: 24 U/L (ref 0–44)
AST: 19 U/L (ref 15–41)
Albumin: 3 g/dL — ABNORMAL LOW (ref 3.5–5.0)
Alkaline Phosphatase: 55 U/L (ref 38–126)
Anion gap: 7 (ref 5–15)
BUN: 16 mg/dL (ref 8–23)
CO2: 34 mmol/L — ABNORMAL HIGH (ref 22–32)
Calcium: 8.9 mg/dL (ref 8.9–10.3)
Chloride: 100 mmol/L (ref 98–111)
Creatinine, Ser: 0.4 mg/dL — ABNORMAL LOW (ref 0.44–1.00)
GFR calc Af Amer: >60 mL/min (ref >60)
GFR calc non Af Amer: >60 mL/min (ref >60)
Glucose, Bld: 85 mg/dL (ref 70–99)
Potassium: 4.4 mmol/L (ref 3.5–5.1)
Sodium: 141 mmol/L (ref 135–145)
Total Bilirubin: 0.8 mg/dL (ref 0.3–1.2)
Total Protein: 5.7 g/dL — ABNORMAL LOW (ref 6.5–8.1)

## 2019-10-14 LAB — BLOOD GAS, ARTERIAL
Acid-Base Excess: 9.9 mmol/L — ABNORMAL HIGH (ref 0.0–2.0)
Bicarbonate: 35.4 mmol/L — ABNORMAL HIGH (ref 20.0–28.0)
Drawn by: 25788
FIO2: 28
O2 Saturation: 97.7 %
Patient temperature: 97.7
pCO2 arterial: 50.8 mmHg — ABNORMAL HIGH (ref 32.0–48.0)
pH, Arterial: 7.454 — ABNORMAL HIGH (ref 7.350–7.450)
pO2, Arterial: 90.6 mmHg (ref 83.0–108.0)

## 2019-10-14 LAB — PROTIME-INR
INR: 1.6 — ABNORMAL HIGH (ref 0.8–1.2)
Prothrombin Time: 19.3 seconds — ABNORMAL HIGH (ref 11.4–15.2)

## 2019-10-14 LAB — CULTURE, BLOOD (ROUTINE X 2)
Culture: NO GROWTH
Culture: NO GROWTH

## 2019-10-14 LAB — PHOSPHORUS: Phosphorus: 4 mg/dL (ref 2.5–4.6)

## 2019-10-14 MED ORDER — WARFARIN SODIUM 5 MG PO TABS
5.0000 mg | ORAL_TABLET | Freq: Once | ORAL | Status: AC
  Administered 2019-10-14: 19:00:00 5 mg via ORAL
  Filled 2019-10-14: qty 1

## 2019-10-14 NOTE — Telephone Encounter (Signed)
New message  Patient wants Dr. Debara Pickett to know that she is in [place] and she would like a return call.

## 2019-10-14 NOTE — Progress Notes (Signed)
PROGRESS NOTE    IVON WIGINTON  [id number] DOB: 1945/10/21 DOA: 10/09/2019 PCP: Prince Solian, MD   Brief Narrative:  Patient was admitted to the hospital with a working diagnosis of acute hypoxic respiratory failure due to pneumonia, complicated by severe sepsis.  This 74 year old female who presented with dyspnea. She does have significant past medical history for paroxysmal atrial fibrillation, hypothyroidism, multiple sclerosis (wheelchair bound for the last 3 years)and obstructive sleep apnea. Recent hospitalization01/1/21 to 01/08/21for [id number] infection complicated by hypotension and urinary tract infection. Discharged to skilled nursing facility. From SNF she was discharged homeon 10/07/19. Two days prior to hospitalization she developed dyspnea,associated with subjective fevers but with no coughor congestion.EMS was called and her pulse oximetry was 80% on room air. Physical examination her blood pressure was 126/77, heart rate 76-97, respiratory rate 17, oxygen saturation 98%, she had moist mucous membranes, her lungs had no wheezing or rales, heart S1-S2, present rhythm, soft abdomen, non pittinglower extremity edema. Urine analysis negative for infection. Chest radiograph had bilateral hilar congestion, reticulonodular infiltrate at the right base/right middle lobe. Patient received supplemental oxygen per nonrebreather mask,andbroad spectrum antibiotic therapy,along with diuresis with furosemide. Oxygen requirements have improved and patient was weaned off to nasal cannula.   Assessment & Plan:   Principal Problem:   HCAP (healthcare-associated pneumonia) Active Problems:   Long term (current) use of anticoagulants   Atrial fibrillation with RVR (HCC)   Obstructive sleep apnea treated with bilevel positive airway pressure (BiPAP)   Acute respiratory failure with hypoxia (HCC)   Severe sepsis ([place])  1. Acute hypoxic respiratory failure due to right  middle lobe pneumonia, complicated with sepsis (end-organ damage hypoxemia)/ present on admission. Oxygen requirements between 3 and 4 L/ min  per [place], stable wbc at 8.1 and cultures so far  no growth. Continue antibiotic therapy with oral azithromycin and IV ceftriaxone.  Out of bed to chair as tolerated, PT and OT. Patient is wheelchair bound. Has home health services and has declined SNF.   2. Atrial fibrillation with RVR. Tolerating well oral diltiazem, 180 mg daily, HR 66 to 87 bpm. Will continue anticoagulation with warfarin per pharmacy protocol. INR today down to 1.6 from 4,5.   She reports having palpitation last night, A. fib with RVR noted.  Cardizem given.  Heart rate controlled now.  3. Acute on chronic diastolic heart failure. s/ p diuresis with IV furosemide. Today patient euvolemic, will continue oral furosemide and blood pressure monitoring. Continue to hold on midodrine for now.  4. Hypothyroid. On levothyroxine.  5. Multiple sclerosis/ sp baclofen pump. No clinical signs of exacerbation,  wheelchair bound for about 3 years. Will resume home health services at discharge. Will resume as needed po baclofen. Continue bupropion. Add dulcolax to bowel regimen.  fecal impaction done, consider tap water enema.  6. Dyslipidemia. On simvastatin.  DVT prophylaxis:warfarin Code Status:DNR Family Communication:I spoke over the phone with the patient's daughter about patient's condition, plan of care, prognosis and all questions were addressed. Disposition Plan/ discharge barriers: Anticipated discharge home with HA once medically clear. Still has hypoxia, requiring 4 L/ min supplemental Ox     Consultants:     Procedures:  None    Antimicrobials: Anti-infectives (From admission, onward)   Start     Dose/Rate Route Frequency Ordered Stop   10/11/19 1000  azithromycin (ZITHROMAX) tablet 500 mg     500 mg Oral Daily 10/11/19 0831     10/11/19 0900  cefTRIAXone  (ROCEPHIN) 1 g  in sodium chloride 0.9 % 100 mL IVPB     1 g 200 mL/hr over 30 Minutes Intravenous Every 24 hours 10/11/19 0831     10/10/19 1800  vancomycin (VANCOCIN) IVPB 1000 mg/200 mL premix  Status:  Discontinued     1,000 mg 200 mL/hr over 60 Minutes Intravenous Every 24 hours 10/09/19 2002 10/10/19 1201   10/10/19 0100  ceFEPIme (MAXIPIME) 2 g in sodium chloride 0.9 % 100 mL IVPB  Status:  Discontinued     2 g 200 mL/hr over 30 Minutes Intravenous Every 8 hours 10/09/19 2002 10/11/19 0831   10/09/19 2030  azithromycin (ZITHROMAX) 500 mg in sodium chloride 0.9 % 250 mL IVPB  Status:  Discontinued     500 mg 250 mL/hr over 60 Minutes Intravenous Every 24 hours 10/09/19 1943 10/11/19 0831   10/09/19 1700  vancomycin (VANCOREADY) IVPB 1500 mg/300 mL     1,500 mg 150 mL/hr over 120 Minutes Intravenous  Once 10/09/19 1646 10/09/19 2100   10/09/19 1645  ceFEPIme (MAXIPIME) 1 g in sodium chloride 0.9 % 100 mL IVPB  Status:  Discontinued     1 g 200 mL/hr over 30 Minutes Intravenous  Once 10/09/19 1634 10/09/19 1636   10/09/19 1645  ceFEPIme (MAXIPIME) 2 g in sodium chloride 0.9 % 100 mL IVPB     2 g 200 mL/hr over 30 Minutes Intravenous  Once 10/09/19 1637 10/09/19 1719      Subjective: Patient was seen and examined at bedside. states she had palpitations last night because of her atrial fibrillation.  She c/o: worsening shortness of breath,  O2 sat 95% on 2 L/ m, still hasn't had bowel movement yet after having soap water enema.  Objective: Vitals:   10/13/19 2029 10/14/19 0416 10/14/19 0500 10/14/19 1220  BP: (!) 107/51 (!) 110/53  129/74  Pulse: 97 71  87  Resp: 16 16    Temp: 98.6 F (37 C) 97.7 F (36.5 C)  98.3 F (36.8 C)  TempSrc: Oral Oral  Oral  SpO2: 96% 99%  97%  Weight:   85.4 kg   Height:        Intake/Output Summary (Last 24 hours) at 10/14/2019 1221 Last data filed at 10/14/2019 0518 Gross per 24 hour  Intake 720 ml  Output 900 ml  Net -180 ml   Filed  Weights   10/12/19 0500 10/13/19 0607 10/14/19 0500  Weight: 85.5 kg 84.6 kg 85.4 kg    Examination:  General exam: Appears calm and comfortable  Respiratory system: Clear to auscultation. Respiratory effort normal. Cardiovascular system: S1 & S2 heard, RRR. No JVD, murmurs, rubs, gallops or clicks. No pedal edema. Gastrointestinal system: Abdomen is nondistended, soft and nontender. No organomegaly or masses felt. Normal bowel sounds heard. Central nervous system: Alert and oriented. No focal neurological deficits. Extremities:  No edema, no leg pain. Skin: No rashes, lesions or ulcers Psychiatry: Judgement and insight appear normal. Mood & affect appropriate.     Data Reviewed: I have personally reviewed following labs and imaging studies  CBC: Recent Labs  Lab 10/09/19 1557 10/09/19 1557 10/10/19 0044 10/11/19 0256 10/12/19 0358 10/13/19 0345 10/14/19 0251  WBC 13.4*   < > 13.1* 7.8 8.1 7.8 7.2  NEUTROABS 9.9*  --  8.8*  --  4.3 3.7  --   HGB 14.3   < > 13.3 11.5* 12.1 12.0 11.7*  HCT 44.0   < > 41.0 35.8* 37.2 37.4 37.0  MCV 95.7   < >  95.8 96.0 95.9 96.9 98.1  PLT 197   < > 182 157 160 179 181   < > = values in this interval not displayed.   Basic Metabolic Panel: Recent Labs  Lab 10/09/19 1557 10/09/19 1943 10/10/19 0044 10/11/19 0256 10/12/19 0358 10/13/19 0345 10/14/19 0251  NA   < >  --  139 138 140 141 141  K   < >  --  3.8 3.6 4.0 4.1 4.4  CL   < >  --  97* 100 100 101 100  CO2   < >  --  32 30 33* 32 34*  GLUCOSE   < >  --  88 84 92 78 85  BUN   < >  --  17 23 18 15 16   CREATININE   < >  --  0.49 0.36* 0.43* 0.44 0.40*  CALCIUM   < >  --  8.6* 8.2* 8.4* 8.8* 8.9  MG  --  2.1 1.9 2.1  --   --  2.1  PHOS  --   --  3.2 3.3  --   --  4.0   < > = values in this interval not displayed.   GFR: Estimated Creatinine Clearance: 65.8 mL/min (A) (by C-G formula based on SCr of 0.4 mg/dL (L)). Liver Function Tests: Recent Labs  Lab 10/09/19 1557  10/10/19 0044 10/11/19 0256 10/14/19 0251  AST 15 15 13* 19  ALT 21 17 16 24   ALKPHOS 59 52 44 55  BILITOT 0.9 1.2 0.8 0.8  PROT 6.4* 6.0* 5.5* 5.7*  ALBUMIN 3.6 3.3* 3.0* 3.0*   No results for input(s): LIPASE, AMYLASE in the last 168 hours. No results for input(s): AMMONIA in the last 168 hours. Coagulation Profile: Recent Labs  Lab 10/10/19 0044 10/11/19 0256 10/12/19 0358 10/13/19 0345 10/14/19 0251  INR 2.9* 4.5* 4.1* 2.3* 1.6*   Cardiac Enzymes: No results for input(s): CKTOTAL, CKMB, CKMBINDEX, TROPONINI in the last 168 hours. BNP (last 3 results) No results for input(s): PROBNP in the last 8760 hours. HbA1C: No results for input(s): HGBA1C in the last 72 hours. CBG: No results for input(s): GLUCAP in the last 168 hours. Lipid Profile: No results for input(s): CHOL, HDL, LDLCALC, TRIG, CHOLHDL, LDLDIRECT in the last 72 hours. Thyroid Function Tests: No results for input(s): TSH, T4TOTAL, FREET4, T3FREE, THYROIDAB in the last 72 hours. Anemia Panel: No results for input(s): VITAMINB12, FOLATE, FERRITIN, TIBC, IRON, RETICCTPCT in the last 72 hours. Sepsis Labs: Recent Labs  Lab 10/09/19 1943 10/09/19 2114 10/10/19 0044  PROCALCITON <0.10  --   --   LATICACIDVEN  --  1.1 1.4    Recent Results (from the past 240 hour(s))  MRSA PCR Screening     Status: None   Collection Time: 10/09/19  7:46 PM   Specimen: Nasal Mucosa; Nasopharyngeal  Result Value Ref Range Status   MRSA by PCR NEGATIVE NEGATIVE Final    Comment:        The GeneXpert MRSA Assay (FDA approved for NASAL specimens only), is one component of a comprehensive MRSA colonization surveillance program. It is not intended to diagnose MRSA infection nor to guide or monitor treatment for MRSA infections. Performed at [facility], [place] [address] [place], [place] [address]   Culture, blood (Routine X 2) w Reflex to ID Panel     Status: None   Collection Time: 10/09/19  9:04  PM   Specimen: BLOOD  Result Value Ref Range  Status   Specimen Description   Final    BLOOD BLOOD RIGHT WRIST Performed at [facility] [address], [place], [place] [address]    Special Requests   Final    BOTTLES DRAWN AEROBIC ONLY Blood Culture results may not be optimal due to an excessive volume of blood received in culture bottles Performed at [facility] [address] [place], [place] [address]    Culture   Final    NO GROWTH 5 DAYS Performed at [facility], [place] [address], [place], [place] [address]    Report Status 10/14/2019 FINAL  Final  Culture, blood (Routine X 2) w Reflex to ID Panel     Status: None   Collection Time: 10/09/19  9:04 PM   Specimen: BLOOD  Result Value Ref Range Status   Specimen Description   Final    BLOOD BLOOD RIGHT ARM Performed at [facility] [address], [place], [place] [address]    Special Requests   Final    BOTTLES DRAWN AEROBIC AND ANAEROBIC Blood Culture results may not be optimal due to an excessive volume of blood received in culture bottles Performed at [facility] [address] [place], [place] [address]    Culture   Final    NO GROWTH 5 DAYS Performed at [facility], [place] [address] [place], [place] [address]    Report Status 10/14/2019 FINAL  Final      Radiology Studies: No results found.  Scheduled Meds: . ascorbic acid  500 mg Oral Daily  . azithromycin  500 mg Oral Daily  . buPROPion  300 mg Oral Daily  . calcium-vitamin D  1 tablet Oral BID WC  . cholecalciferol  2,000 Units Oral BID  . diltiazem  180 mg Oral Daily  . docusate sodium  100 mg Oral Daily  . famotidine  20 mg Oral QHS  . furosemide  20 mg Oral Daily  . gabapentin  300 mg Oral TID  . levothyroxine  75 mcg Oral Daily  . mouth rinse  15 mL Mouth Rinse BID  . pantoprazole  40 mg Oral Daily  . polyethylene glycol  17 g Oral Daily  . simvastatin   10 mg Oral QPM  . sodium chloride flush  3 mL Intravenous Q12H  . warfarin  5 mg Oral ONCE-1800  . Warfarin - Pharmacist Dosing Inpatient   Does not apply q1800   Continuous Infusions: . cefTRIAXone (ROCEPHIN)  IV 1 g (10/14/19 0920)     LOS: 5 days    Time spent: 25 mins.    Shawna Clamp, MD [facility]   If 7PM-7AM, please contact night-coverage

## 2019-10-14 NOTE — Progress Notes (Signed)
Physical Therapy Treatment Patient Details Name: Toni [name] MRN: [id number] DOB: 1946/01/28 Today's Date: 10/14/2019    History of Present Illness Pt admitted with resp failure with sepsis 2* PNA;  Pt recently dc for rehab setting at [facility] following COVID dx in January.  Pt with hx of MS, PAF, A-fib with RVR, s/p cervical fusion, back surgery and Baclofen INfusion Pain pump implantation    PT Comments    Pt reports receiving enema earlier and eager to use BSC so assisted pt with transfer using Stedy.  Pt with impaction and assisted back to bed for RN to disimpact.  Observed HR 140 bpm with activity.  SpO2 93-99% on room air.   Follow Up Recommendations  Home health PT     Equipment Recommendations  None recommended by PT    Recommendations for Other Services       Precautions / Restrictions Precautions Precautions: Fall Precaution Comments: monitor HR    Mobility  Bed Mobility Overal bed mobility: Needs Assistance Bed Mobility: Supine to Sit;Sit to Sidelying       Sit to supine: Mod assist;+2 for physical assistance Sit to sidelying: Mod assist General bed mobility comments: Increased time, use of bed rail, physical assist to manage LEs and trunk  Transfers Overall transfer level: Needs assistance   Transfers: Sit to/from Stand Sit to Stand: Mod assist;From elevated surface;+2 physical assistance         General transfer comment: assist to rise, and control descent, pt with difficulty performing hip extension so provided a little assist to utilize flaps on stedy to sit, pt performed standing x3 (pt wishing to utilize [facility], received enema earlier); required back to bed for RN to assist with disimpaction; HR 140 bpm with transfer  Ambulation/Gait                 Stairs             Wheelchair Mobility    Modified Rankin (Stroke Patients Only)       Balance                                            Cognition  Arousal/Alertness: Awake/alert Behavior During Therapy: WFL for tasks assessed/performed Overall Cognitive Status: Within Functional Limits for tasks assessed                                        Exercises      General Comments        Pertinent Vitals/Pain Pain Assessment: 0-10 Pain Score: 6  Pain Location: L posterior leg Pain Descriptors / Indicators: Sore Pain Intervention(s): Repositioned;Monitored during session    Home Living                      Prior Function            PT Goals (current goals can now be found in the care plan section) Progress towards PT goals: Progressing toward goals    Frequency    Min 3X/week      PT Plan Current plan remains appropriate    Co-evaluation              AM-PAC PT "6 Clicks" Mobility   Outcome Measure  Help needed turning from your  back to your side while in a flat bed without using bedrails?: A Little Help needed moving from lying on your back to sitting on the side of a flat bed without using bedrails?: A Lot Help needed moving to and from a bed to a chair (including a wheelchair)?: A Lot Help needed standing up from a chair using your arms (e.g., wheelchair or bedside chair)?: A Lot Help needed to walk in hospital room?: Total Help needed climbing 3-5 steps with a railing? : Total 6 Click Score: 11    End of Session   Activity Tolerance: Patient tolerated treatment well Patient left: in bed;with call bell/phone within reach;with nursing/sitter in room   PT Visit Diagnosis: Difficulty in walking, not elsewhere classified (R26.2);Muscle weakness (generalized) (M62.81)     Time: [id number] PT Time Calculation (min) (ACUTE ONLY): 27 min  Charges:  $Therapeutic Activity: 23-37 mins                     Jannette Spanner PT, DPT Acute Rehabilitation Services Office: [phone]  Trena Platt 10/14/2019, 3:51 PM

## 2019-10-14 NOTE — Telephone Encounter (Signed)
Thanks .Marland Kitchen I'll look into whether she needs to see [name] or not. Dr. Marlou Porch is rounding at [facility] this week.

## 2019-10-14 NOTE — Telephone Encounter (Signed)
Returned call to patient-she states she is at [facility] currently admitted.   She would like to make sure Dr. Debara Pickett is aware she is in the hospital.   She states she is being managed by hospitalist.  She also request appt to see Dr. Debara Pickett soon after she gets discharged.   Advised to request cards consult if appropriate but would make Dr. Debara Pickett aware as well.   Advised they would either schedule her appt at d/c or to call at d/c and we could get follow up arranged.  Patient verbalized understanding.

## 2019-10-14 NOTE — Progress Notes (Signed)
OT Cancellation Note  Patient Details Name: Toni [name] MRN: [id number] DOB: May 12, 1946   Cancelled Treatment:    Reason Eval/Treat Not Completed: Other (comment)  Spoke with RN who deferred OT for pt this day Kari Baars, [place] [phone] Office- [place], Edwena Felty D 10/14/2019, 12:00 PM

## 2019-10-14 NOTE — Progress Notes (Signed)
[place] for warfarin Indication: hx atrial fibrillation  Allergies  Allergen Reactions  . Chlorhexidine Hives  . Hydrocodone Nausea And Vomiting    Hydrocodone causes vomiting  . Oxycodone Nausea And Vomiting    Oral oxycodone causes vomiting  . Chlorhexidine Gluconate Rash    Sores at site  . Zoloft [Sertraline] Hives, Swelling and Rash   Patient Measurements: Height: 5\' 3"  (160 cm) Weight: 188 lb 4.4 oz (85.4 kg) IBW/kg (Calculated) : 52.4  Vital Signs: Temp: 97.7 F (36.5 C) (02/23 0416) Temp Source: Oral (02/23 0416) BP: 110/53 (02/23 0416) Pulse Rate: 71 (02/23 0416)  Labs: Recent Labs    10/12/19 0358 10/12/19 0358 10/13/19 0345 10/14/19 0251  HGB 12.1   < > 12.0 11.7*  HCT 37.2  --  37.4 37.0  PLT 160  --  179 181  LABPROT 40.0*  --  25.3* 19.3*  INR 4.1*  --  2.3* 1.6*  CREATININE 0.43*  --  0.44 0.40*   < > = values in this interval not displayed.   Estimated Creatinine Clearance: 65.8 mL/min (A) (by C-G formula based on SCr of 0.4 mg/dL (L)).  Medications:  PTA regimen: 5mg  daily except 2.5 mg on Mondays and Wednesdays--> per [facility] clinic visit on 10/08/2019.  However, patient said that since she got discharged from rehab facility (10/07/19), she has been using their medication dose packs with a warfarin regimen of 4mg  daily except 4.5 mg on Mon and Fri  Assessment: 74 y.o F with hx afib on warfarin on admission presented to the ED on 2/18 with ShOB.  Warfarin resumed on admission.  Today, 10/14/2019: - INR 1.6 now subtherapeutic after holding dose on 2/20 and 2/21 - CBC stable - drug-drug intxns: abx can make pt more sensitive to warfarin, Current Azithromycin, Ceftriaxone - no reported bleeding  Goal of Therapy:  INR 2-3 Monitor platelets by anticoagulation protocol: Yes   Plan:  Warfarin 5mg  po today at 1800 x 1 Daily Protime/INR Monitor po intake, s/s bleed   Dolly Rias RPh 10/14/2019, 10:49 AM

## 2019-10-14 NOTE — Plan of Care (Signed)
  Problem: Health Behavior/Discharge Planning: Goal: Ability to manage health-related needs will improve Outcome: Progressing   Problem: Clinical Measurements: Goal: Respiratory complications will improve Outcome: Progressing   Problem: Skin Integrity: Goal: Risk for impaired skin integrity will decrease Outcome: Progressing   Problem: Education: Goal: Knowledge of General Education information will improve Description: Including pain rating scale, medication(s)/side effects and non-pharmacologic comfort measures Outcome: Progressing   Problem: Clinical Measurements: Goal: Ability to maintain clinical measurements within normal limits will improve Outcome: Progressing   Problem: Coping: Goal: Level of anxiety will decrease Outcome: Progressing

## 2019-10-15 ENCOUNTER — Telehealth: Payer: Self-pay

## 2019-10-15 DIAGNOSIS — J189 Pneumonia, unspecified organism: Secondary | ICD-10-CM

## 2019-10-15 DIAGNOSIS — I4891 Unspecified atrial fibrillation: Secondary | ICD-10-CM

## 2019-10-15 LAB — COMPREHENSIVE METABOLIC PANEL
ALT: 19 U/L (ref 0–44)
AST: 14 U/L — ABNORMAL LOW (ref 15–41)
Albumin: 2.9 g/dL — ABNORMAL LOW (ref 3.5–5.0)
Alkaline Phosphatase: 48 U/L (ref 38–126)
Anion gap: 8 (ref 5–15)
BUN: 22 mg/dL (ref 8–23)
CO2: 34 mmol/L — ABNORMAL HIGH (ref 22–32)
Calcium: 8.9 mg/dL (ref 8.9–10.3)
Chloride: 99 mmol/L (ref 98–111)
Creatinine, Ser: 0.46 mg/dL (ref 0.44–1.00)
GFR calc Af Amer: >60 mL/min (ref >60)
GFR calc non Af Amer: >60 mL/min (ref >60)
Glucose, Bld: 89 mg/dL (ref 70–99)
Potassium: 3.9 mmol/L (ref 3.5–5.1)
Sodium: 141 mmol/L (ref 135–145)
Total Bilirubin: 0.7 mg/dL (ref 0.3–1.2)
Total Protein: 5.7 g/dL — ABNORMAL LOW (ref 6.5–8.1)

## 2019-10-15 LAB — CBC
HCT: 36.6 % (ref 36.0–46.0)
Hemoglobin: 12 g/dL (ref 12.0–15.0)
MCH: 31.5 pg (ref 26.0–34.0)
MCHC: 32.8 g/dL (ref 30.0–36.0)
MCV: 96.1 fL (ref 80.0–100.0)
Platelets: 190 10*3/uL (ref 150–400)
RBC: 3.81 MIL/uL — ABNORMAL LOW (ref 3.87–5.11)
RDW: 14 % (ref 11.5–15.5)
WBC: 7.2 10*3/uL (ref 4.0–10.5)
nRBC: 0 % (ref 0.0–0.2)

## 2019-10-15 LAB — PROTIME-INR
INR: 1.5 — ABNORMAL HIGH (ref 0.8–1.2)
Prothrombin Time: 17.8 seconds — ABNORMAL HIGH (ref 11.4–15.2)

## 2019-10-15 LAB — PHOSPHORUS: Phosphorus: 4.3 mg/dL (ref 2.5–4.6)

## 2019-10-15 LAB — MAGNESIUM: Magnesium: 2 mg/dL (ref 1.7–2.4)

## 2019-10-15 MED ORDER — WARFARIN SODIUM 5 MG PO TABS
5.0000 mg | ORAL_TABLET | Freq: Once | ORAL | Status: AC
  Administered 2019-10-15: 5 mg via ORAL
  Filled 2019-10-15: qty 1

## 2019-10-15 MED ORDER — MIDODRINE HCL 5 MG PO TABS
5.0000 mg | ORAL_TABLET | Freq: Three times a day (TID) | ORAL | Status: DC
  Administered 2019-10-15 – 2019-10-17 (×4): 5 mg via ORAL
  Filled 2019-10-15 (×7): qty 1

## 2019-10-15 MED ORDER — FUROSEMIDE 20 MG PO TABS
20.0000 mg | ORAL_TABLET | Freq: Two times a day (BID) | ORAL | Status: DC
  Administered 2019-10-16 (×2): 20 mg via ORAL
  Filled 2019-10-15 (×3): qty 1

## 2019-10-15 NOTE — Telephone Encounter (Signed)
Orders given to [facility] from [facility] to draw INR on Monday March 1st

## 2019-10-15 NOTE — Progress Notes (Addendum)
[place] for warfarin Indication: hx atrial fibrillation  Allergies  Allergen Reactions  . Chlorhexidine Hives  . Hydrocodone Nausea And Vomiting    Hydrocodone causes vomiting  . Oxycodone Nausea And Vomiting    Oral oxycodone causes vomiting  . Chlorhexidine Gluconate Rash    Sores at site  . Zoloft [Sertraline] Hives, Swelling and Rash   Patient Measurements: Height: 5\' 3"  (160 cm) Weight: 183 lb 3.2 oz (83.1 kg) IBW/kg (Calculated) : 52.4  Vital Signs: Temp: 97.6 F (36.4 C) (02/24 0633) Temp Source: Oral (02/24 0633) BP: 98/55 (02/24 [id number]) Pulse Rate: 66 (02/24 0633)  Labs: Recent Labs    10/13/19 0345 10/13/19 0345 10/14/19 0251 10/15/19 0310  HGB 12.0   < > 11.7* 12.0  HCT 37.4  --  37.0 36.6  PLT 179  --  181 190  LABPROT 25.3*  --  19.3* 17.8*  INR 2.3*  --  1.6* 1.5*  CREATININE 0.44  --  0.40* 0.46   < > = values in this interval not displayed.   Estimated Creatinine Clearance: 64.9 mL/min (by C-G formula based on SCr of 0.46 mg/dL).  Medications:  PTA regimen: 5mg  daily except 2.5 mg on Mondays and Wednesdays--> per [facility] clinic visit on 10/08/2019.  However, patient said that since she got discharged from rehab facility (10/07/19), she has been using their medication dose packs with a warfarin regimen of 4mg  daily except 4.5 mg on Mon and Fri  Assessment: 74 y.o F with hx afib on warfarin on admission presented to the ED on 2/18 with ShOB.  Warfarin resumed on admission.  Today, 10/15/2019: - INR 1.5 subtherapeutic after holding doses on 2/20 and 2/21 - CBC stable - drug-drug intxns: azith stopped today - no reported bleeding  Goal of Therapy:  INR 2-3 Monitor platelets by anticoagulation protocol: Yes   Plan:  Warfarin 5mg  po today at 1800 x 1 Daily Protime/INR Monitor po intake, s/s bleed   Dolly Rias RPh 10/15/2019, 10:57 AM

## 2019-10-15 NOTE — Consult Note (Addendum)
Cardiology Consultation:   Patient ID: Toni [name] MRN: [id number]; DOB: 1946-01-19  Admit date: 10/09/2019 Date of Consult: 10/15/2019  Primary Care Provider: Prince Solian, MD Primary Cardiologist: Toni Casino, MD  Primary Electrophysiologist:  None    Patient Profile:   Toni [name] is a 74 y.o. female who is being seen today for the evaluation of atrial fibrillation with rapid ventricular response at the request of Dr Toni [name].  History of Present Illness:   Toni [name] is a 74 year old female patient of Toni [name] with paroxysmal atrial fibrillation on warfarin obstructive sleep apnea with BiPAP hypothyroidism multiple sclerosis admitted on 10/09/2019 with acute hypoxic respiratory failure with severe sepsis due to hospital acquired pneumonia.  Back in January she had COVID-19.  Hypotension and UTI at that time.  Orthostasis.  Midodrine and salt was utilized and discontinuation of Bystolic and diltiazem at that time were replaced with low-dose metoprolol tartrate 12.5 twice daily.  She was sent off to the rehab facility with no residual shortness of breath until she went home on 10/07/2019.  She was only home for 2 days and she reported development of shortness of breath with subjective fever.  Metoprolol tartrate 12.5 twice daily at home.  Taking Lasix 20 mg a day as well.  Here she was changed to diltiazem drip and metoprolol was stopped.  She is currently on diltiazem 180.  Chest x-ray showed bilateral airspace opacities concerning for infiltrate.  Her EKG showed A. fib with RVR with heart rate of 135 initially.  T wave inversion isolated in V1 was unchanged.  She was given a diltiazem drip in the ER.  Currently on telemetry, her heart rates are mostly below 100, in the 80s with overall good rate control.  She has been weaned to nasal cannula.  Blood cultures have been negative.  Antibiotics continued.  During the hospitalization, oral diltiazem 180 mg a day were given.   Overnight she did have brief episode of RVR.  Cardizem as needed was administered.  Heart rate under good control currently.  She is quite adamant about not going back to skilled nursing facility.  She wants to go back home.  She was only home for 2 days.  Overall she is resting comfortably currently laying in bed.  No shortness of breath.  Her blood pressures are quite low especially when she stands, orthostatic.  This prohibits her sometimes from doing physical therapy.  We are going to restart her midodrine.  Heart Pathway Score:     Past Medical History:  Diagnosis Date  . Abnormality of gait 03/01/2015  . Anxiety   . Arthritis    "knees" (01/19/2016)  . Atrial fibrillation with RVR ([place]) 01/19/2016  . Back pain   . Chest pain   . Childhood asthma   . Chronic pain    "nerve pain from the MS"  . Constipation   . Depression   . DVT (deep venous thrombosis) ([place]) 1983   "RLE; may have just been phlebitis; it was before the age of dopplers"  . 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 GERD (gastroesophageal reflux disease)   . History of stress test 06/21/10   limited exam with some degree of breast attenuatin of the apex however a component of apical ischemia is not excluded, EF 62%; cardiac cath   . HLD (hyperlipidemia)   . Hx of echocardiogram 04/22/10   EF 55-60%,  no valve issues  . Hypertension   . Hypothyroidism   . Joint pain   . Migraine    "none since I stopped beta blocker in 1990s" (01/19/2016)  . Multiple sclerosis ([place])    has had this may 1989-[facility] reg doc  . OSA on CPAP   . Osteoarthritis   . PAF (paroxysmal atrial fibrillation) ([place])    on coumadin; documented on monitor 06/2010  . Pneumonia 11/2011  . Shortness of breath   . Thyroid disease     Past Surgical History:  Procedure Laterality Date  . ANTERIOR CERVICAL DECOMP/DISCECTOMY FUSION  01/2004  . BACK SURGERY    . CARDIAC CATHETERIZATION  06/22/2010    normal coronary arteries, PAF  . CATARACT EXTRACTION, BILATERAL Bilateral   . CHILD BIRTHS     X2  . DILATION AND CURETTAGE OF UTERUS    . INFUSION PUMP IMPLANTATION  ~ 2009   baclofen infusion in lower abd  . PAIN PUMP REVISION N/A 07/29/2014   Procedure: Baclofen pump replacement;  Surgeon: Erline Levine, MD;  Location: [facility];  Service: Neurosurgery;  Laterality: N/A;  Baclofen pump replacement  . TONSILLECTOMY AND ADENOIDECTOMY    . VARICOSE VEIN SURGERY  ~ 1968  . VENOUS ABLATION  12/16/2010   radiofreq ablation -Dr Elisabeth Cara and [facility]  . WISDOM TOOTH EXTRACTION       Home Medications:  Prior to Admission medications   Medication Sig Start Date End Date Taking? Authorizing Provider  acetaminophen (TYLENOL) 500 MG tablet Take 500-1,000 mg by mouth every 6 (six) hours as needed for fever or headache (or pain).   Yes [provider]  ALPRAZolam Duanne Moron) 0.5 MG tablet Take 1 tablet (0.5 mg total) by mouth at bedtime as needed for anxiety. 08/29/19  Yes Arma Heading, MD  Ascorbic Acid (VITAMIN C) 500 MG CAPS Take 500 mg by mouth daily.   Yes [provider]  baclofen (LIORESAL) 20 MG tablet Once to two tablets a day for breakthrough spasms Patient taking differently: Take 20 mg by mouth 2 (two) times daily as needed for muscle spasms.  08/27/18  Yes Dohmeier, Asencion Partridge, MD  buPROPion (WELLBUTRIN XL) 300 MG 24 hr tablet Take 1 tablet (300 mg total) by mouth daily. 08/23/17  Yes Beasley, Caren D, MD  Calcium Carbonate-Vit D-Min (CALTRATE 600+D PLUS MINERALS) 600-800 MG-UNIT TABS Take 2 tablets by mouth 2 (two) times daily.   Yes [provider]  Cholecalciferol (VITAMIN D) 2000 units tablet Take 1 tablet (2,000 Units total) by mouth 2 (two) times daily. 03/22/17  Yes Beasley, Caren D, MD  corticotropin (ACTHAR) 80 UNIT/ML injectable gel INJECT 80 UNITS (1ML) DAILY FOR 10 DAYS. DUE TO MS EXACERBATION AND THEN CAN RESUME NEXT MONTH AT PREVIOUS DOSE. Patient taking  differently: Inject 80 Units into the muscle every other day. For 10 days each month. 06/18/19  Yes Dohmeier, Asencion Partridge, MD  Cranberry 1000 MG CAPS Take 2 capsules by mouth 2 (two) times daily.    Yes [provider]  docusate sodium (COLACE) 100 MG capsule Take 1 capsule (100 mg total) by mouth daily. 08/12/16  Yes Rai, Ripudeep K, MD  famotidine (PEPCID) 20 MG tablet Take 20 mg by mouth at bedtime.  06/11/18  Yes [provider]  fluticasone (FLONASE) 50 MCG/ACT nasal spray Place 2 sprays into both nostrils daily as needed for allergies.  03/23/15  Yes [provider]  furosemide (LASIX) 20 MG tablet Take 1 tablet by mouth daily,  may take second dose if needed for excess edema 09/30/19  Yes Hilty, Nadean Corwin, MD  gabapentin (NEURONTIN) 300 MG capsule TAKE 1 CAPSULE BY MOUTH UP  TO 4 TIMES DAILY Patient taking differently: Take 300 mg by mouth 4 (four) times daily.  12/31/18  Yes Dohmeier, Asencion Partridge, MD  GENERLAC 10 GM/15ML SOLN TAKE 15 ML BY MOUTH TWICE DAILY AS NEEDED Patient taking differently: Take 10 g by mouth 2 (two) times daily as needed (constipation).  06/25/19  Yes Dohmeier, Asencion Partridge, MD  levothyroxine (SYNTHROID, LEVOTHROID) 75 MCG tablet Take 75 mcg by mouth daily.   Yes [provider]  Melatonin 3 MG TABS Take 1 tablet (3 mg total) by mouth at bedtime as needed (sleep). 08/08/19  Yes Donne Hazel, MD  metoprolol tartrate (LOPRESSOR) 25 MG tablet Take 0.5 tablets (12.5 mg total) by mouth 2 (two) times daily. 09/30/19  Yes Hilty, Nadean Corwin, MD  midodrine (PROAMATINE) 5 MG tablet Take 1 tablet (5 mg total) by mouth 3 (three) times daily with meals. 09/30/19  Yes Hilty, Nadean Corwin, MD  omeprazole (PRILOSEC) 20 MG capsule Take 20 mg by mouth daily.    Yes [provider]  ondansetron (ZOFRAN) 4 MG tablet Take 4 mg by mouth every 8 (eight) hours as needed for nausea or vomiting.   Yes [provider]  promethazine (PHENERGAN) 25 MG suppository Place 25  mg rectally every 6 (six) hours as needed for nausea or vomiting.   Yes [provider]  psyllium (METAMUCIL SMOOTH TEXTURE) 28 % packet Take 1 packet by mouth daily.    Yes [provider]  simvastatin (ZOCOR) 10 MG tablet Take 10 mg by mouth every evening.  03/01/18  Yes [provider]  Tamsulosin HCl (FLOMAX) 0.4 MG CAPS Take 0.4 mg by mouth daily as needed (bladder emptying).    Yes [provider]  WARFARIN SODIUM PO Take 4-4.5 mg by mouth See admin instructions. Take 4.5 mg once daily on Monday and Friday.  All other days of the week, take 4 mg.   Yes [provider]  cetirizine (ZYRTEC) 10 MG tablet Take 10 mg by mouth daily as needed for allergies.     [provider]  sodium chloride 1 g tablet Take 1 tablet (1 g total) by mouth 2 (two) times daily with a meal. Patient not taking: Reported on 10/09/2019 08/29/19   Arma Heading, MD  SYRINGE/NEEDLE, DISP, 1 ML (BD LUER-LOK SYRINGE) 25G X 5/8" 1 ML MISC Use as directed to inject  [place] 06/15/15   Dohmeier, Asencion Partridge, MD  warfarin (COUMADIN) 5 MG tablet Take 1/2 to 1 tablet by mouth daily as directed by coumadin clinic. Patient not taking: Reported on 10/09/2019 09/24/19   Toni Casino, MD  zinc sulfate 220 (50 Zn) MG capsule Take 1 capsule (220 mg total) by mouth daily. Patient not taking: Reported on 10/09/2019 08/30/19   Arma Heading, MD    Inpatient Medications: Scheduled Meds: . ascorbic acid  500 mg Oral Daily  . buPROPion  300 mg Oral Daily  . calcium-vitamin D  1 tablet Oral BID WC  . cholecalciferol  2,000 Units Oral BID  . diltiazem  180 mg Oral Daily  . docusate sodium  100 mg Oral Daily  . famotidine  20 mg Oral QHS  . furosemide  20 mg Oral Daily  . gabapentin  300 mg Oral TID  . levothyroxine  75 mcg Oral Daily  . mouth rinse  15 mL Mouth Rinse BID  . pantoprazole  40 mg Oral Daily  . polyethylene glycol  17 g Oral Daily  . simvastatin  10 mg Oral QPM  . sodium  chloride flush  3 mL Intravenous Q12H  . warfarin  5 mg Oral ONCE-1800  . Warfarin - Pharmacist Dosing Inpatient   Does not apply q1800   Continuous Infusions: . cefTRIAXone (ROCEPHIN)  IV 1 g (10/15/19 0837)   PRN Meds: acetaminophen **OR** acetaminophen, albuterol, ALPRAZolam, baclofen, lactulose, loratadine, Melatonin, ondansetron **OR** ondansetron (ZOFRAN) IV, sodium chloride  Allergies:    Allergies  Allergen Reactions  . Chlorhexidine Hives  . Hydrocodone Nausea And Vomiting    Hydrocodone causes vomiting  . Oxycodone Nausea And Vomiting    Oral oxycodone causes vomiting  . Chlorhexidine Gluconate Rash    Sores at site  . Zoloft [Sertraline] Hives, Swelling and Rash    Social History:   Social History   Socioeconomic History  . Marital status: Married    Spouse name: [name]  . Number of children: 2  . Years of education: COLLEGE  . Highest education level: Not on file  Occupational History  . Occupation: RETIRED  Tobacco Use  . Smoking status: Former Smoker    Packs/day: 0.50    Years: 7.00    Pack years: 3.50    Types: Cigarettes    Quit date: 08/21/1976    Years since quitting: 43.1  . Smokeless tobacco: Never Used  Substance and Sexual Activity  . Alcohol use: Yes    Alcohol/week: 1.0 standard drinks    Types: 1 Glasses of wine per week  . Drug use: No  . Sexual activity: Not Currently  Other Topics Concern  . Not on file  Social History Narrative   Lives in [place] with Husband [name] [phone], [phone]   Currently in rehab   Social Determinants of Health   Financial Resource Strain:   . Difficulty of Paying Living Expenses: Not on file  Food Insecurity:   . Worried About Charity fundraiser in the Last Year: Not on file  . Ran Out of Food in the Last Year: Not on file  Transportation Needs:   . Lack of Transportation (Medical): Not on file  . Lack of Transportation (Non-Medical): Not on file  Physical Activity:   . Days of Exercise per Week: Not on  file  . Minutes of Exercise per Session: Not on file  Stress:   . Feeling of Stress : Not on file  Social Connections:   . Frequency of Communication with Friends and Family: Not on file  . Frequency of Social Gatherings with Friends and Family: Not on file  . Attends Religious Services: Not on file  . Active Member of Clubs or Organizations: Not on file  . Attends Archivist Meetings: Not on file  . Marital Status: Not on file  Intimate Partner Violence:   . Fear of Current or Ex-Partner: Not on file  . Emotionally Abused: Not on file  . Physically Abused: Not on file  . Sexually Abused: Not on file    Family History:    Family History  Problem Relation Age of Onset  . Coronary artery disease Father        at age 43  . Hyperlipidemia Father   . Thyroid disease Father   . Coronary artery disease Maternal Grandmother   . Depression Maternal Grandmother   . Cancer Paternal Grandmother   . Depression Mother   .  Sudden death Mother   . Anxiety disorder Mother   . Alcoholism Son      ROS:  Please see the history of present illness.  Positive for recent shortness of breath palpitations denies chest pain All other ROS reviewed and negative.     Physical Exam/Data:   Vitals:   10/14/19 0500 10/14/19 1220 10/14/19 2038 10/15/19 0633  BP:  129/74 (!) 113/51 (!) 98/55  Pulse:  87 88 66  Resp:   18 18  Temp:  98.3 F (36.8 C) 98.9 F (37.2 C) 97.6 F (36.4 C)  TempSrc:  Oral Oral Oral  SpO2:  97% 97% 96%  Weight: 85.4 kg   83.1 kg  Height:        Intake/Output Summary (Last 24 hours) at 10/15/2019 1204 Last data filed at 10/15/2019 [id number] Gross per 24 hour  Intake 620 ml  Output 1650 ml  Net -1030 ml   Last 3 Weights 10/15/2019 10/14/2019 10/13/2019  Weight (lbs) 183 lb 3.2 oz 188 lb 4.4 oz 186 lb 8 oz  Weight (kg) 83.1 kg 85.4 kg 84.596 kg     Body mass index is 32.45 kg/m.  General:  Well nourished, well developed, in no acute distress HEENT:  normal Lymph: no adenopathy Neck: no JVD Endocrine:  No thryomegaly Vascular: No carotid bruits; FA pulses 2+ bilaterally without bruits  Cardiac:  normal S1, S2; irregularly irregular normal rate; no murmur  Lungs:  clear to auscultation bilaterally, no wheezing, rhonchi or rales  Abd: soft, nontender, no hepatomegaly  Ext: no edema Musculoskeletal:  No deformities, BUE and BLE strength normal and equal Skin: warm and dry  Neuro:  CNs 2-12 intact, no focal abnormalities noted Psych:  Normal affect   EKG:  The EKG was personally reviewed and demonstrates: A. fib heart rate 135 bpm on admission.  Prior EKG 08/27/2019 shows A. fib heart rate in the 60s.  Telemetry:  Telemetry was personally reviewed and demonstrates: Brief period of tachycardia with atrial fibrillation noted overnight.  Currently well rate controlled.  Relevant CV Studies:  Echocardiogram 10/10/2019-EF 60%, mildly elevated pulmonary pressures 34 mmHg, left atrium severely dilated, no significant change from prior study.  Laboratory Data:  High Sensitivity Troponin:  No results for input(s): TROPONINIHS in the last 720 hours.   Chemistry Recent Labs  Lab 10/13/19 0345 10/14/19 0251 10/15/19 0310  NA 141 141 141  K 4.1 4.4 3.9  CL 101 100 99  CO2 32 34* 34*  GLUCOSE 78 85 89  BUN 15 16 22   CREATININE 0.44 0.40* 0.46  CALCIUM 8.8* 8.9 8.9  GFRNONAA >60 >60 >60  GFRAA >60 >60 >60  ANIONGAP 8 7 8     Recent Labs  Lab 10/11/19 0256 10/14/19 0251 10/15/19 0310  PROT 5.5* 5.7* 5.7*  ALBUMIN 3.0* 3.0* 2.9*  AST 13* 19 14*  ALT 16 24 19   ALKPHOS 44 55 48  BILITOT 0.8 0.8 0.7   Hematology Recent Labs  Lab 10/13/19 0345 10/14/19 0251 10/15/19 0310  WBC 7.8 7.2 7.2  RBC 3.86* 3.77* 3.81*  HGB 12.0 11.7* 12.0  HCT 37.4 37.0 36.6  MCV 96.9 98.1 96.1  MCH 31.1 31.0 31.5  MCHC 32.1 31.6 32.8  RDW 14.1 14.1 14.0  PLT 179 181 190   BNP Recent Labs  Lab 10/09/19 1557  BNP 381.6*    DDimer No results  for input(s): DDIMER in the last 168 hours.   Radiology/Studies:  DG CHEST PORT 1 VIEW  Result  Date: 10/14/2019 CLINICAL DATA:  Shortness of breath, respiratory failure due to pneumonia; history hypertension, multiple sclerosis, paroxysmal atrial fibrillation, type II diabetes mellitus EXAM: PORTABLE CHEST 1 VIEW COMPARISON:  Portable exam 1156 hours compared to 10/09/2019 FINDINGS: Enlargement of cardiac silhouette. Mediastinal contours and pulmonary vascularity normal. Chronic accentuation of RIGHT infrahilar markings. No definite acute infiltrate, pleural effusion, or pneumothorax. Prior cervical spine fusion. Bones demineralized. IMPRESSION: Enlargement of cardiac silhouette. No acute abnormalities. Electronically Signed   By: Lavonia Dana M.D.   On: 10/14/2019 13:49         Assessment and Plan:   Persistent atrial fibrillation -Continue with current dose of diltiazem 180 mg once a day.  Overall good rate control.  Two nights she did have a brief period of atrial fibrillation with rapid ventricular response.  If necessary, she could use diltiazem 30 mg p.o. every 6 hours as needed for periods like this where she has rapid palpitations. -I am fine with her not using the low-dose metoprolol  Chronic anticoagulation -Continue with warfarin.  This is longstanding. -Last INR was 1.5, adjust per pharmacy.  Pneumonia -IV antibiotics.  Provement of x-ray.  Multiple sclerosis -Baclofen.  Orthostatic hypotension -I agree with reinitiation of midodrine.  This will likely be absolutely necessary if she continues to take her Lasix 20 mg for her abdominal swelling and ankle swelling.  She was taking her Lasix 20 mg twice a day.  She tells me that her Lasix dose has gradually decreased.  I explained to her that was probably because of the ongoing orthostatic hypotension.  DNR  Continue with current management strategy.  Please let us know if we can be of further assistance.  We will sign off.  I  will let Dr. Debara Pickett know we saw her.      For questions or updates, please contact [place] Please consult [email or web address] for contact info under     Signed, Candee Furbish, MD  10/15/2019 12:04 PM

## 2019-10-15 NOTE — Telephone Encounter (Signed)
Pt called and stated that they are being discharged Friday with home health again and orders need to be given to them.

## 2019-10-15 NOTE — Consult Note (Signed)
   [facility] Inpatient Consult   10/15/2019  Toni [name] [birth year] [id number]   Patient screened for potential [facility] Care Management services due to unplanned readmission risk score of 27% and multiple hospitalizations.   Per review, [facility] CM post acute RN made referral for patient to be followed by [facility] after last discharge from SNF. Confirmed that patient is currently active with [facility] at home program. Will continue to follow for progression and disposition plans.  Of note, [facility] Care Management services does not replace or interfere with any services that are arranged by inpatient case management or social work.  Netta Cedars, MSN, [place] [facility] Liaison Nurse Mobile Phone [phone]  Toll free office [phone]

## 2019-10-15 NOTE — Progress Notes (Signed)
Occupational Therapy Treatment Patient Details Name: Toni [name] MRN: [id number] DOB: 05/10/1946 Today's Date: 10/15/2019    History of present illness Pt admitted with resp failure with sepsis 2* PNA;  Pt recently dc for rehab setting at [facility] following [place] dx in January.  Pt with hx of MS, PAF, A-fib with RVR, s/p cervical fusion, back surgery and Baclofen INfusion Pain pump implantation   OT comments  Pt VERY excited to get in chair with STEADY Upon getting to chair pt wanted to groom, perform cross words and BUE exercise.      Follow Up Recommendations  Home health OT;Supervision/Assistance - 24 hour    Equipment Recommendations  None recommended by OT    Recommendations for Other Services      Precautions / Restrictions Precautions Precautions: Fall Precaution Comments: monitor HR       Mobility Bed Mobility Overal bed mobility: Needs Assistance Bed Mobility: Supine to Sit;Sit to Sidelying       Sit to supine: Mod assist;+2 for physical assistance Sit to sidelying: Mod assist General bed mobility comments: Increased time, use of bed rail, physical assist to manage LEs and trunk  Transfers Overall transfer level: Needs assistance   Transfers: Sit to/from Stand Sit to Stand: Mod assist;From elevated surface;+2 physical assistance         General transfer comment: assist to rise, and control descent, pt with difficulty performing hip extension so provided a little assist to utilize flaps on stedy to sit, pt performed standing x3 (pt wishing to utilize [facility], received enema earlier); required back to bed for RN to assist with disimpaction; HR 140 bpm with transfer    Balance Overall balance assessment: Needs assistance Sitting-balance support: No upper extremity supported Sitting balance-Leahy Scale: Fair Sitting balance - Comments: increased use of UEs with increased fatigue                                   ADL either performed or  assessed with clinical judgement   ADL Overall ADL's : Needs assistance/impaired Eating/Feeding: Set up;Sitting   Grooming: Set up;Sitting   Upper Body Bathing: Minimal assistance;Sitting                   Toileting- Clothing Manipulation and Hygiene: Sit to/from stand;Maximal assistance;+2 for physical assistance;+2 for safety/equipment Toileting - Clothing Manipulation Details (indicate cue type and reason): using STEADY       General ADL Comments: Used STEADY to get pt to chair     Vision Patient Visual Report: No change from baseline            Cognition Arousal/Alertness: Awake/alert Behavior During Therapy: WFL for tasks assessed/performed Overall Cognitive Status: Within Functional Limits for tasks assessed                                                     Pertinent Vitals/ Pain       Pain Assessment: No/denies pain         Frequency  Min 2X/week        Progress Toward Goals  OT Goals(current goals can now be found in the care plan section)  Progress towards OT goals: Progressing toward goals  ADL Goals Pt Will Perform Grooming: with set-up;sitting Pt Will  Perform Lower Body Bathing: with mod assist;sitting/lateral leans;with caregiver independent in assisting Pt Will Perform Lower Body Dressing: with mod assist;sitting/lateral leans;with caregiver independent in assisting;bed level Pt Will Transfer to Toilet: bedside commode;with min assist(STEADY) Pt Will Perform Toileting - Clothing Manipulation and hygiene: sitting/lateral leans;with mod assist(using STEADY or lift DME pt has at home)  Plan Discharge plan remains appropriate       AM-PAC OT "6 Clicks" Daily Activity     Outcome Measure   Help from another person eating meals?: A Little Help from another person taking care of personal grooming?: A Little Help from another person toileting, which includes using toliet, bedpan, or urinal?: Total Help from another  person bathing (including washing, rinsing, drying)?: A Lot Help from another person to put on and taking off regular upper body clothing?: A Lot Help from another person to put on and taking off regular lower body clothing?: Total 6 Click Score: 12    End of Session Equipment Utilized During Treatment: Other (comment)(STEADY)  OT Visit Diagnosis: Unsteadiness on feet (R26.81);Muscle weakness (generalized) (M62.81);History of falling (Z91.81);Repeated falls (R29.6);Other abnormalities of gait and mobility (R26.89)   Activity Tolerance Patient tolerated treatment well   Patient Left with call bell/phone within reach;in bed   Nurse Communication Mobility status        Time: 1320-1400 OT Time Calculation (min): 40 min  Charges: OT General Charges $OT Visit: 1 Visit OT Treatments $Self Care/Home Management : 38-52 mins  Kari Baars, [place] [phone] Office- [phone], Edwena Felty D 10/15/2019, 3:49 PM

## 2019-10-15 NOTE — Progress Notes (Signed)
PROGRESS NOTE  Toni [name]  [id number] DOB: Dec 24, 1945 DOA: 10/09/2019 PCP: Prince Solian, MD  Outpatient Specialists: Dr. Debara Pickett, Cardiology Brief Narrative: Toni [name] is a 74 y.o. female with a history of PAF, wheelchair-bound multiple sclerosis, OSA, hypothyroidism, hypotension, and recent covid-19, UTI who presented to the ED 2/18 with dyspnea. She'd been in SNF or the hospital for months prior, only discharged from SNF 2 days before admission here. CXR suggested an element of volume overload and possible HCAP for which diuresis and antibiotics were started with improvement in hypoxemia. Hospitalization has been further complicated by rapid ventricular response to AFib for which diltiazem infusion was given, ultimately transitioned to oral medications with improvement, though still intermittently rapid rate. Blood pressures have been recurrently low as well despite modification of diuretic/antihypertensive/rate control agents. Cardiology has been consulted and made recommendations. PT/OT has recommended return to SNF based on decreased independence, impaired functional status, though the patient has suffered from draconian visitor restrictions at [facility] since late 2020 and has arranged 24 hours care in addition to multiple home health services several times per week.   Assessment & Plan: Principal Problem:   HCAP (healthcare-associated pneumonia) Active Problems:   Long term (current) use of anticoagulants   Atrial fibrillation with RVR (HCC)   Obstructive sleep apnea treated with bilevel positive airway pressure (BiPAP)   Acute respiratory failure with hypoxia (HCC)   Severe sepsis (HCC)  Severe sepsis and acute hypoxic respiratory failure due to RML HCAP: Based on initial CXR. Hypoxemia resolved.  - Completes ceftriaxone x7 days today. No need for continued azithromycin, stopped this AM.  - Cultures negative to date.   Persistent atrial fibrillation with RVR: RVR improving  overall, still sporadically out of control. Note biatrial dilatation on echocardiogram 2/19. - Cardiology consultation appreciated. We will forego beta blocker and continue diltiazem. Low dose of 180mg  leaves room for prn's if BP will tolerate.  - Anticoagulation continued - Follow up with Dr. Debara Pickett in next week or so.  Acute on chronic HFpEF:  - Diuresed successfully with IV lasix. Weight down 192lbs >> 183lbs. Continue po lasix, believe dose should be lasix 20mg  po BID if BP will tolerate (using midodrine for support in this regard).   Hypotension: Persistent despite treatment of infection, not dehydrated and not bleeding.  - Restart midodrine, ok'ed by cardiology.   Multiple sclerosis: Followed by Dr. [name] - Continue frequent PT, OT. Continue home medications including baclofen pump.  Hypothyroidism: TSH 1.374.  - Continue home dose synthroid  Supratherapeutic INR:  - Coumadin managed per pharmacy. This is a long term medication.   OSA:  - Continue qHS NIPPV  Dyslipidemia:  - Continue statin  History of covid-19: Noted.    DVT prophylaxis: Warfarin Code Status: DNR Family Communication: Daughter by phone Disposition Plan: Patient recently mostly in SNF, but from home. Though SNF is recommended by therapy and medical team, the patient has taken significant strides to create a safe environment at home. Will plan to DC there once HR stable, possibly 2/25-2/26.  Consultants:   Cardiology  Procedures:   Echocardiogram 10/10/2019: 1. Left ventricular ejection fraction, by estimation, is 55 to 60%. The  left ventricle has normal function. The left ventricle has no regional  wall motion abnormalities. Left ventricular diastolic function could not  be evaluated.  2. Right ventricular systolic function is normal. The right ventricular  size is normal. There is normal pulmonary artery systolic pressure. The  estimated right ventricular systolic pressure is 34.0  mmHg.  3.  Left atrial size was severely dilated.  4. Right atrial size was mild to moderately dilated.  5. The mitral valve is grossly normal. Mild to moderate mitral valve  regurgitation.  6. The aortic valve is tricuspid. Aortic valve regurgitation is not  visualized. No aortic stenosis is present.  7. The inferior vena cava is dilated in size with <50% respiratory  variability, suggesting right atrial pressure of 15 mmHg.   Antimicrobials:  Ceftriaxone, azithromycin 2/18 - 2/24  Subjective: Feels some palpitations at night. No chest pains. Dyspnea much improved. Wants to get up with PT. Doesn't and didn't have a cough.  Objective: Vitals:   10/14/19 0500 10/14/19 1220 10/14/19 2038 10/15/19 0633  BP:  129/74 (!) 113/51 (!) 98/55  Pulse:  87 88 66  Resp:   18 18  Temp:  98.3 F (36.8 C) 98.9 F (37.2 C) 97.6 F (36.4 C)  TempSrc:  Oral Oral Oral  SpO2:  97% 97% 96%  Weight: 85.4 kg   83.1 kg  Height:        Intake/Output Summary (Last 24 hours) at 10/15/2019 1844 Last data filed at 10/15/2019 [id number] Gross per 24 hour  Intake --  Output 1650 ml  Net -1650 ml   Filed Weights   10/13/19 0607 10/14/19 0500 10/15/19 [id number]  Weight: 84.6 kg 85.4 kg 83.1 kg    Gen: 74 y.o. female in no distress Pulm: Non-labored breathing room air, no crackles.  CV: Irreg irreg w/rate between 80-120. No murmur, rub, or gallop. No JVD, Trace pedal edema. GI: Abdomen soft, non-tender, non-distended, with normoactive bowel sounds. No organomegaly or masses felt. Ext: Warm, no deformities Skin: No rashes, lesions or new ulcers Neuro: Alert and oriented. No focal neurological deficits. Psych: Judgement and insight appear normal. Mood & affect appropriate.   Data Reviewed: I have personally reviewed following labs and imaging studies  CBC: Recent Labs  Lab 10/09/19 1557 10/09/19 1557 10/10/19 0044 10/10/19 0044 10/11/19 0256 10/12/19 0358 10/13/19 0345 10/14/19 0251 10/15/19 0310  WBC  13.4*   < > 13.1*   < > 7.8 8.1 7.8 7.2 7.2  NEUTROABS 9.9*  --  8.8*  --   --  4.3 3.7  --   --   HGB 14.3   < > 13.3   < > 11.5* 12.1 12.0 11.7* 12.0  HCT 44.0   < > 41.0   < > 35.8* 37.2 37.4 37.0 36.6  MCV 95.7   < > 95.8   < > 96.0 95.9 96.9 98.1 96.1  PLT 197   < > 182   < > 157 160 179 181 190   < > = values in this interval not displayed.   Basic Metabolic Panel: Recent Labs  Lab 10/09/19 1557 10/09/19 1943 10/10/19 0044 10/10/19 0044 10/11/19 0256 10/12/19 0358 10/13/19 0345 10/14/19 0251 10/15/19 0310  NA   < >  --  139   < > 138 140 141 141 141  K   < >  --  3.8   < > 3.6 4.0 4.1 4.4 3.9  CL   < >  --  97*   < > 100 100 101 100 99  CO2   < >  --  32   < > 30 33* 32 34* 34*  GLUCOSE   < >  --  88   < > 84 92 78 85 89  BUN   < >  --  17   < >  23 18 15 16 22   CREATININE   < >  --  0.49   < > 0.36* 0.43* 0.44 0.40* 0.46  CALCIUM   < >  --  8.6*   < > 8.2* 8.4* 8.8* 8.9 8.9  MG  --  2.1 1.9  --  2.1  --   --  2.1 2.0  PHOS  --   --  3.2  --  3.3  --   --  4.0 4.3   < > = values in this interval not displayed.   GFR: Estimated Creatinine Clearance: 64.9 mL/min (by C-G formula based on SCr of 0.46 mg/dL). Liver Function Tests: Recent Labs  Lab 10/09/19 1557 10/10/19 0044 10/11/19 0256 10/14/19 0251 10/15/19 0310  AST 15 15 13* 19 14*  ALT 21 17 16 24 19   ALKPHOS 59 52 44 55 48  BILITOT 0.9 1.2 0.8 0.8 0.7  PROT 6.4* 6.0* 5.5* 5.7* 5.7*  ALBUMIN 3.6 3.3* 3.0* 3.0* 2.9*   No results for input(s): LIPASE, AMYLASE in the last 168 hours. No results for input(s): AMMONIA in the last 168 hours. Coagulation Profile: Recent Labs  Lab 10/11/19 0256 10/12/19 0358 10/13/19 0345 10/14/19 0251 10/15/19 0310  INR 4.5* 4.1* 2.3* 1.6* 1.5*   Cardiac Enzymes: No results for input(s): CKTOTAL, CKMB, CKMBINDEX, TROPONINI in the last 168 hours. BNP (last 3 results) No results for input(s): PROBNP in the last 8760 hours. HbA1C: No results for input(s): HGBA1C in the  last 72 hours. CBG: No results for input(s): GLUCAP in the last 168 hours. Lipid Profile: No results for input(s): CHOL, HDL, LDLCALC, TRIG, CHOLHDL, LDLDIRECT in the last 72 hours. Thyroid Function Tests: No results for input(s): TSH, T4TOTAL, FREET4, T3FREE, THYROIDAB in the last 72 hours. Anemia Panel: No results for input(s): VITAMINB12, FOLATE, FERRITIN, TIBC, IRON, RETICCTPCT in the last 72 hours. Urine analysis:    Component Value Date/Time   COLORURINE AMBER (A) 10/09/2019 1722   APPEARANCEUR CLEAR 10/09/2019 1722   APPEARANCEUR Clear 12/18/2014 1156   LABSPEC 1.016 10/09/2019 1722   PHURINE 8.0 10/09/2019 1722   GLUCOSEU NEGATIVE 10/09/2019 1722   HGBUR NEGATIVE 10/09/2019 1722   BILIRUBINUR NEGATIVE 10/09/2019 1722   BILIRUBINUR Negative 12/18/2014 1156   KETONESUR 20 (A) 10/09/2019 1722   PROTEINUR NEGATIVE 10/09/2019 1722   UROBILINOGEN 0.2 08/05/2014 0951   NITRITE NEGATIVE 10/09/2019 1722   LEUKOCYTESUR NEGATIVE 10/09/2019 1722   Recent Results (from the past 240 hour(s))  MRSA PCR Screening     Status: None   Collection Time: 10/09/19  7:46 PM   Specimen: Nasal Mucosa; Nasopharyngeal  Result Value Ref Range Status   MRSA by PCR NEGATIVE NEGATIVE Final    Comment:        The GeneXpert MRSA Assay (FDA approved for NASAL specimens only), is one component of a comprehensive MRSA colonization surveillance program. It is not intended to diagnose MRSA infection nor to guide or monitor treatment for MRSA infections. Performed at [facility], [place] [address], [place], [place] [address]   Culture, blood (Routine X 2) w Reflex to ID Panel     Status: None   Collection Time: 10/09/19  9:04 PM   Specimen: BLOOD  Result Value Ref Range Status   Specimen Description   Final    BLOOD BLOOD RIGHT WRIST Performed at [facility] [address] [place], [place] [address]    Special Requests   Final    BOTTLES DRAWN AEROBIC  ONLY  Blood Culture results may not be optimal due to an excessive volume of blood received in culture bottles Performed at [facility] [address] [place], [place] [address]    Culture   Final    NO GROWTH 5 DAYS Performed at [facility], [place] [address], [place], [place] [address]    Report Status 10/14/2019 FINAL  Final  Culture, blood (Routine X 2) w Reflex to ID Panel     Status: None   Collection Time: 10/09/19  9:04 PM   Specimen: BLOOD  Result Value Ref Range Status   Specimen Description   Final    BLOOD BLOOD RIGHT ARM Performed at [facility] [address] [place], [place] [address]    Special Requests   Final    BOTTLES DRAWN AEROBIC AND ANAEROBIC Blood Culture results may not be optimal due to an excessive volume of blood received in culture bottles Performed at [facility] [address], [place], [place] [address]    Culture   Final    NO GROWTH 5 DAYS Performed at [facility], [place] [address], [place],  [address]    Report Status 10/14/2019 FINAL  Final      Radiology Studies: DG CHEST PORT 1 VIEW  Result Date: 10/14/2019 CLINICAL DATA:  Shortness of breath, respiratory failure due to pneumonia; history hypertension, multiple sclerosis, paroxysmal atrial fibrillation, type II diabetes mellitus EXAM: PORTABLE CHEST 1 VIEW COMPARISON:  Portable exam 1156 hours compared to 10/09/2019 FINDINGS: Enlargement of cardiac silhouette. Mediastinal contours and pulmonary vascularity normal. Chronic accentuation of RIGHT infrahilar markings. No definite acute infiltrate, pleural effusion, or pneumothorax. Prior cervical spine fusion. Bones demineralized. IMPRESSION: Enlargement of cardiac silhouette. No acute abnormalities. Electronically Signed   By: Lavonia Dana M.D.   On: 10/14/2019 13:49    Scheduled Meds: . ascorbic acid  500 mg Oral Daily  . buPROPion  300 mg Oral Daily  . calcium-vitamin D   1 tablet Oral BID WC  . cholecalciferol  2,000 Units Oral BID  . diltiazem  180 mg Oral Daily  . docusate sodium  100 mg Oral Daily  . famotidine  20 mg Oral QHS  . furosemide  20 mg Oral Daily  . gabapentin  300 mg Oral TID  . levothyroxine  75 mcg Oral Daily  . mouth rinse  15 mL Mouth Rinse BID  . midodrine  5 mg Oral TID WC  . pantoprazole  40 mg Oral Daily  . polyethylene glycol  17 g Oral Daily  . simvastatin  10 mg Oral QPM  . sodium chloride flush  3 mL Intravenous Q12H  . Warfarin - Pharmacist Dosing Inpatient   Does not apply q1800   Continuous Infusions: . cefTRIAXone (ROCEPHIN)  IV 1 g (10/15/19 0837)     LOS: 6 days   Time spent: 35 minutes.  Patrecia Pour, MD [facility] [email or web address] 10/15/2019, 6:44 PM

## 2019-10-16 LAB — COMPREHENSIVE METABOLIC PANEL
ALT: 17 U/L (ref 0–44)
AST: 19 U/L (ref 15–41)
Albumin: 3.1 g/dL — ABNORMAL LOW (ref 3.5–5.0)
Alkaline Phosphatase: 49 U/L (ref 38–126)
Anion gap: 10 (ref 5–15)
BUN: 27 mg/dL — ABNORMAL HIGH (ref 8–23)
CO2: 32 mmol/L (ref 22–32)
Calcium: 9.2 mg/dL (ref 8.9–10.3)
Chloride: 98 mmol/L (ref 98–111)
Creatinine, Ser: 0.48 mg/dL (ref 0.44–1.00)
GFR calc Af Amer: >60 mL/min (ref >60)
GFR calc non Af Amer: >60 mL/min (ref >60)
Glucose, Bld: 93 mg/dL (ref 70–99)
Potassium: 4.8 mmol/L (ref 3.5–5.1)
Sodium: 140 mmol/L (ref 135–145)
Total Bilirubin: 0.6 mg/dL (ref 0.3–1.2)
Total Protein: 6 g/dL — ABNORMAL LOW (ref 6.5–8.1)

## 2019-10-16 LAB — PROTIME-INR
INR: 1.6 — ABNORMAL HIGH (ref 0.8–1.2)
Prothrombin Time: 18.9 seconds — ABNORMAL HIGH (ref 11.4–15.2)

## 2019-10-16 MED ORDER — DILTIAZEM HCL 60 MG PO TABS
60.0000 mg | ORAL_TABLET | Freq: Four times a day (QID) | ORAL | Status: DC | PRN
  Administered 2019-10-17: 60 mg via ORAL
  Filled 2019-10-16: qty 1

## 2019-10-16 MED ORDER — BISACODYL 10 MG RE SUPP
10.0000 mg | Freq: Every day | RECTAL | Status: DC | PRN
  Administered 2019-10-16: 10 mg via RECTAL
  Filled 2019-10-16: qty 1

## 2019-10-16 MED ORDER — WARFARIN SODIUM 5 MG PO TABS
5.0000 mg | ORAL_TABLET | Freq: Once | ORAL | Status: AC
  Administered 2019-10-16: 19:00:00 5 mg via ORAL
  Filled 2019-10-16: qty 1

## 2019-10-16 NOTE — TOC Progression Note (Signed)
Transition of Care [facility]) - Progression Note    Patient Details  Name: Toni [name] MRN: [id number] Date of Birth: 1946-05-10  [facility] CM/SW Contact  Purcell Mouton, RN Phone Number: 10/16/2019, 3:55 PM  Clinical Narrative:    2/25 Pt selected [facility] for [facility] needs.   Expected Discharge Plan: [place] Barriers to Discharge: No Barriers Identified  Expected Discharge Plan and Services Expected Discharge Plan: [place]   Discharge Planning Services: CM Consult Post Acute Care Choice: [place] arrangements for the past 2 months: Single Family Home                           HH Arranged: RN, PT, OT [facility] Agency: [place] ([facility]) Date [place]: 10/13/19 Time HH Agency Contacted: 0900 Representative spoke with at [place]: Santiago Glad, RN/Text   Social Determinants of Health ([place]) Interventions    Readmission Risk Interventions No flowsheet data found.

## 2019-10-16 NOTE — Care Management Important Message (Signed)
Important Message  Patient Details IM Letter given to Gabriel Earing RN Case Manager to present to the Patient Name: Toni [name] MRN: [id number] Date of Birth: [birth year]   Medicare Important Message Given:  Yes     Kerin Salen 10/16/2019, 2:16 PM

## 2019-10-16 NOTE — Progress Notes (Signed)
Physical Therapy Treatment Patient Details Name: ANZAL COFFEY MRN: [id number] DOB: [birth year] Today's Date: 10/16/2019    History of Present Illness Pt admitted with resp failure with sepsis 2* PNA;  Pt recently dc for rehab setting at [facility] following COVID dx in January.  Pt with hx of MS, PAF, A-fib with RVR, s/p cervical fusion, back surgery and Baclofen INfusion Pain pump implantation    PT Comments    Pt with improved mobility today and able to assist with sit to stands from stedy.  Pt may benefit from attempting lateral/scoot transfers next session since improved with stedy today.  Pt with elevated HR with activity however and did reports some dizziness upon sitting in recliner at end of session (see below).   Follow Up Recommendations  Home health PT     Equipment Recommendations  None recommended by PT    Recommendations for Other Services       Precautions / Restrictions Precautions Precautions: Fall Precaution Comments: monitor HR    Mobility  Bed Mobility Overal bed mobility: Needs Assistance Bed Mobility: Supine to Sit     Supine to sit: Min assist     General bed mobility comments: Increased time, use of bed rail, physical assist to manage trunk  Transfers Overall transfer level: Needs assistance   Transfers: Sit to/from Stand Sit to Stand: Min assist;Min guard         General transfer comment: initial stand assist to rise, used flaps on stedy for pt to sit, from stedy pt performed 4 mini sit to stands with longest standing hold of count to 20.  HR elevated to upper 140s so used stedy to transfer over to recliner; pt reports dizziness "feeling funny" upon sitting in recliner however HR 90 and SPO2 98% room air; BP documentated in flowsheets and RN aware (first diastolic high)  Ambulation/Gait                 Stairs             Wheelchair Mobility    Modified Rankin (Stroke Patients Only)       Balance                                             Cognition Arousal/Alertness: Awake/alert Behavior During Therapy: WFL for tasks assessed/performed Overall Cognitive Status: Within Functional Limits for tasks assessed                                        Exercises      General Comments        Pertinent Vitals/Pain Pain Assessment: No/denies pain    Home Living                      Prior Function            PT Goals (current goals can now be found in the care plan section) Progress towards PT goals: Progressing toward goals    Frequency    Min 3X/week      PT Plan Current plan remains appropriate    Co-evaluation              AM-PAC PT "6 Clicks" Mobility   Outcome Measure  Help needed turning from your back  to your side while in a flat bed without using bedrails?: A Little Help needed moving from lying on your back to sitting on the side of a flat bed without using bedrails?: A Little Help needed moving to and from a bed to a chair (including a wheelchair)?: A Lot Help needed standing up from a chair using your arms (e.g., wheelchair or bedside chair)?: A Lot Help needed to walk in hospital room?: Total Help needed climbing 3-5 steps with a railing? : Total 6 Click Score: 12    End of Session   Activity Tolerance: Patient tolerated treatment well Patient left: in chair;with call bell/phone within reach;with chair alarm set   PT Visit Diagnosis: Muscle weakness (generalized) (M62.81);Other abnormalities of gait and mobility (R26.89)     Time: [id number] PT Time Calculation (min) (ACUTE ONLY): 29 min  Charges:  $Therapeutic Activity: 23-37 mins                     Jannette Spanner PT, DPT Acute Rehabilitation Services Office: [phone]   Trena Platt 10/16/2019, 3:36 PM

## 2019-10-16 NOTE — Progress Notes (Addendum)
RN was confirming code status with patient. Patient states she does not wish to be a DNR. She states she would like to be a full code. Patient states "The guy in the ED did not explain this very well and made it seem that it would be a useless cause for me to be a full code. I believe his personal beliefs came through in that moment. The way he explained it was that my body is dead and there is no bringing me back."    RN notified Tylene Fantasia, NP immediately upon obtaining the above information of the patient's wishes to be switched to a Full Code.

## 2019-10-16 NOTE — Progress Notes (Addendum)
PROGRESS NOTE  Toni [name]  [id number] DOB: 06/04/1946 DOA: 10/09/2019 PCP: Prince Solian, MD  Outpatient Specialists: Dr. Debara Pickett, Cardiology Brief Narrative: Toni [name] is a 74 y.o. female with a history of PAF, wheelchair-bound multiple sclerosis, OSA, hypothyroidism, hypotension, and recent covid-19, UTI who presented to the ED 2/18 with dyspnea. She'd been in SNF or the hospital for months prior, only discharged from SNF 2 days before admission here. CXR suggested an element of volume overload and possible HCAP for which diuresis and antibiotics were started with improvement in hypoxemia. Hospitalization has been further complicated by rapid ventricular response to AFib for which diltiazem infusion was given, ultimately transitioned to oral medications with improvement, though still intermittently rapid rate. Blood pressures have been recurrently low as well despite modification of diuretic/antihypertensive/rate control agents. Cardiology has been consulted and made recommendations. PT/OT has recommended return to SNF based on decreased independence, impaired functional status, though the patient has suffered from draconian visitor restrictions at [facility] since late 2020 and has arranged 24 hours care in addition to multiple home health services several times per week.   Assessment & Plan: Principal Problem:   HCAP (healthcare-associated pneumonia) Active Problems:   Long term (current) use of anticoagulants   Atrial fibrillation with RVR (HCC)   Obstructive sleep apnea treated with bilevel positive airway pressure (BiPAP)   Acute respiratory failure with hypoxia (HCC)   Severe sepsis (HCC)  Severe sepsis and acute hypoxic respiratory failure due to RML HCAP: Based on initial CXR. Hypoxemia resolved.  - Completed ceftriaxone x7 days.  - Cultures negative to date.   Persistent atrial fibrillation with RVR: RVR improving overall, still sporadically out of control. Note biatrial  dilatation on echocardiogram 2/19. - Cardiology consultation appreciated. We will forego beta blocker and continue diltiazem. Low dose of 180mg  leaves room for prn's if BP will tolerate. Ordered dilt prn HR >110bpm and MAP >70.  - Anticoagulation continued - Follow up with Dr. Debara Pickett is scheduled next week.  Acute on chronic HFpEF:  - Diuresed successfully with IV lasix. Weight down 192lbs >> 181lbs. Continue po lasix, believe dose should be lasix 20mg  po BID if BP will tolerate (using midodrine for support in this regard).   Hypotension: Persistent despite treatment of infection, not dehydrated and not bleeding.  - Restart midodrine, ok'ed by cardiology.   Multiple sclerosis: Followed by Dr. [name] for baclofen pump, Dr. Brett Fairy for MS. - Continue frequent PT, OT. Continue home medications including baclofen pump.  Hypothyroidism: TSH 1.374.  - Continue home dose synthroid  Supratherapeutic INR: Resolved. - Coumadin managed per pharmacy. This is a long term medication.   OSA:  - Continue qHS NIPPV  Dyslipidemia:  - Continue statin  History of covid-19: Noted.   DVT prophylaxis: Coumadin Code Status: Full Family Communication: Daughter by phone Disposition Plan: Patient recently mostly in SNF, but from home. Though SNF is recommended by therapy and medical team, the patient has taken significant strides to create a safe environment at home. Will plan to DC there once HR stable, likely 2/26.  Consultants:   Cardiology  Procedures:   Echocardiogram 10/10/2019: 1. Left ventricular ejection fraction, by estimation, is 55 to 60%. The  left ventricle has normal function. The left ventricle has no regional  wall motion abnormalities. Left ventricular diastolic function could not  be evaluated.  2. Right ventricular systolic function is normal. The right ventricular  size is normal. There is normal pulmonary artery systolic pressure. The  estimated right ventricular  systolic  pressure is [id number] mmHg.  3. Left atrial size was severely dilated.  4. Right atrial size was mild to moderately dilated.  5. The mitral valve is grossly normal. Mild to moderate mitral valve  regurgitation.  6. The aortic valve is tricuspid. Aortic valve regurgitation is not  visualized. No aortic stenosis is present.  7. The inferior vena cava is dilated in size with <50% respiratory  variability, suggesting right atrial pressure of 15 mmHg.   Antimicrobials:  Ceftriaxone, azithromycin 2/18 - 2/24  Subjective: Shortness of breath is improved, didn't take lasix last night because it got too late forgetting she had external catheter.   Objective: Vitals:   10/14/19 2038 10/15/19 0633 10/15/19 2048 10/16/19 0640  BP: (!) 113/51 (!) 98/55 118/67 (!) 112/57  Pulse: 88 66 (!) 105 67  Resp: 18 18 20 20   Temp: 98.9 F (37.2 C) 97.6 F (36.4 C) 98.6 F (37 C) 98.2 F (36.8 C)  TempSrc: Oral Oral    SpO2: 97% 96% 98% 100%  Weight:  83.1 kg  82.3 kg  Height:        Intake/Output Summary (Last 24 hours) at 10/16/2019 0913 Last data filed at 10/16/2019 0655 Gross per 24 hour  Intake 273 ml  Output 2400 ml  Net -2127 ml   Filed Weights   10/14/19 0500 10/15/19 0633 10/16/19 0640  Weight: 85.4 kg 83.1 kg 82.3 kg   Gen: 74 y.o. female in no distress Pulm: Nonlabored breathing room air, 100% SpO2. Clear bilaterally including bases. CV: Irreg w/rate 90-100bpm. No murmur, rub, or gallop. No JVD, no pitting dependent edema. GI: Abdomen soft, non-tender, non-distended, with normoactive bowel sounds.  Ext: Warm, no deformities Skin: No rashes, lesions or ulcers on visualized skin. Neuro: Alert and oriented. No focal neurological deficits. Psych: Judgement and insight appear fair. Mood euthymic & affect congruent. Behavior is appropriate.    Data Reviewed: I have personally reviewed following labs and imaging studies  CBC: Recent Labs  Lab 10/09/19 1557 10/09/19 1557  10/10/19 0044 10/10/19 0044 10/11/19 0256 10/12/19 0358 10/13/19 0345 10/14/19 0251 10/15/19 0310  WBC 13.4*   < > 13.1*   < > 7.8 8.1 7.8 7.2 7.2  NEUTROABS 9.9*  --  8.8*  --   --  4.3 3.7  --   --   HGB 14.3   < > 13.3   < > 11.5* 12.1 12.0 11.7* 12.0  HCT 44.0   < > 41.0   < > 35.8* 37.2 37.4 37.0 36.6  MCV 95.7   < > 95.8   < > 96.0 95.9 96.9 98.1 96.1  PLT 197   < > 182   < > 157 160 179 181 190   < > = values in this interval not displayed.   Basic Metabolic Panel: Recent Labs  Lab 10/09/19 1557 10/09/19 1943 10/10/19 0044 10/10/19 0044 10/11/19 0256 10/11/19 0256 10/12/19 0358 10/13/19 0345 10/14/19 0251 10/15/19 0310 10/16/19 0226  NA   < >  --  139   < > 138   < > 140 141 141 141 140  K   < >  --  3.8   < > 3.6   < > 4.0 4.1 4.4 3.9 4.8  CL   < >  --  97*   < > 100   < > 100 101 100 99 98  CO2   < >  --  32   < > 30   < >  33* 32 34* 34* 32  GLUCOSE   < >  --  88   < > 84   < > 92 78 85 89 93  BUN   < >  --  17   < > 23   < > 18 15 16 22  27*  CREATININE   < >  --  0.49   < > 0.36*   < > 0.43* 0.44 0.40* 0.46 0.48  CALCIUM   < >  --  8.6*   < > 8.2*   < > 8.4* 8.8* 8.9 8.9 9.2  MG  --  2.1 1.9  --  2.1  --   --   --  2.1 2.0  --   PHOS  --   --  3.2  --  3.3  --   --   --  4.0 4.3  --    < > = values in this interval not displayed.   GFR: Estimated Creatinine Clearance: 64.6 mL/min (by C-G formula based on SCr of 0.48 mg/dL). Liver Function Tests: Recent Labs  Lab 10/10/19 0044 10/11/19 0256 10/14/19 0251 10/15/19 0310 10/16/19 0226  AST 15 13* 19 14* 19  ALT 17 16 24 19 17   ALKPHOS 52 44 55 48 49  BILITOT 1.2 0.8 0.8 0.7 0.6  PROT 6.0* 5.5* 5.7* 5.7* 6.0*  ALBUMIN 3.3* 3.0* 3.0* 2.9* 3.1*   Coagulation Profile: Recent Labs  Lab 10/12/19 0358 10/13/19 0345 10/14/19 0251 10/15/19 0310 10/16/19 0226  INR 4.1* 2.3* 1.6* 1.5* 1.6*   Urine analysis:    Component Value Date/Time   COLORURINE AMBER (A) 10/09/2019 1722   APPEARANCEUR CLEAR  10/09/2019 1722   APPEARANCEUR Clear 12/18/2014 1156   LABSPEC 1.016 10/09/2019 1722   PHURINE 8.0 10/09/2019 1722   GLUCOSEU NEGATIVE 10/09/2019 1722   HGBUR NEGATIVE 10/09/2019 1722   BILIRUBINUR NEGATIVE 10/09/2019 1722   BILIRUBINUR Negative 12/18/2014 1156   KETONESUR 20 (A) 10/09/2019 1722   PROTEINUR NEGATIVE 10/09/2019 1722   UROBILINOGEN 0.2 08/05/2014 0951   NITRITE NEGATIVE 10/09/2019 1722   LEUKOCYTESUR NEGATIVE 10/09/2019 1722   Recent Results (from the past 240 hour(s))  MRSA PCR Screening     Status: None   Collection Time: 10/09/19  7:46 PM   Specimen: Nasal Mucosa; Nasopharyngeal  Result Value Ref Range Status   MRSA by PCR NEGATIVE NEGATIVE Final    Comment:        The GeneXpert MRSA Assay (FDA approved for NASAL specimens only), is one component of a comprehensive MRSA colonization surveillance program. It is not intended to diagnose MRSA infection nor to guide or monitor treatment for MRSA infections. Performed at [facility], [place] [address], [place], [place] [address]   Culture, blood (Routine X 2) w Reflex to ID Panel     Status: None   Collection Time: 10/09/19  9:04 PM   Specimen: BLOOD  Result Value Ref Range Status   Specimen Description   Final    BLOOD BLOOD RIGHT WRIST Performed at [facility] [address] [place], [place] [address]    Special Requests   Final    BOTTLES DRAWN AEROBIC ONLY Blood Culture results may not be optimal due to an excessive volume of blood received in culture bottles Performed at [facility] [address], [place], [place] [address]    Culture   Final    NO GROWTH 5 DAYS Performed at [facility], [address] [place], [place] [address]    Report Status 10/14/2019 FINAL  Final  Culture, blood (Routine X 2) w Reflex to ID Panel     Status: None   Collection Time: 10/09/19  9:04 PM   Specimen: BLOOD  Result Value Ref Range Status    Specimen Description   Final    BLOOD BLOOD RIGHT ARM Performed at [facility] [address] [place], [place] [address]    Special Requests   Final    BOTTLES DRAWN AEROBIC AND ANAEROBIC Blood Culture results may not be optimal due to an excessive volume of blood received in culture bottles Performed at [facility] [address], [place], [place] [address]    Culture   Final    NO GROWTH 5 DAYS Performed at [facility], [place] [address], [place],  [address]    Report Status 10/14/2019 FINAL  Final      Radiology Studies: DG CHEST PORT 1 VIEW  Result Date: 10/14/2019 CLINICAL DATA:  Shortness of breath, respiratory failure due to pneumonia; history hypertension, multiple sclerosis, paroxysmal atrial fibrillation, type II diabetes mellitus EXAM: PORTABLE CHEST 1 VIEW COMPARISON:  Portable exam 1156 hours compared to 10/09/2019 FINDINGS: Enlargement of cardiac silhouette. Mediastinal contours and pulmonary vascularity normal. Chronic accentuation of RIGHT infrahilar markings. No definite acute infiltrate, pleural effusion, or pneumothorax. Prior cervical spine fusion. Bones demineralized. IMPRESSION: Enlargement of cardiac silhouette. No acute abnormalities. Electronically Signed   By: Lavonia Dana M.D.   On: 10/14/2019 13:49    Scheduled Meds: . ascorbic acid  500 mg Oral Daily  . buPROPion  300 mg Oral Daily  . calcium-vitamin D  1 tablet Oral BID WC  . cholecalciferol  2,000 Units Oral BID  . diltiazem  180 mg Oral Daily  . docusate sodium  100 mg Oral Daily  . famotidine  20 mg Oral QHS  . furosemide  20 mg Oral BID  . gabapentin  300 mg Oral TID  . levothyroxine  75 mcg Oral Daily  . mouth rinse  15 mL Mouth Rinse BID  . midodrine  5 mg Oral TID WC  . pantoprazole  40 mg Oral Daily  . polyethylene glycol  17 g Oral Daily  . simvastatin  10 mg Oral QPM  . sodium chloride flush  3 mL Intravenous Q12H  . warfarin  5 mg Oral  ONCE-1800  . Warfarin - Pharmacist Dosing Inpatient   Does not apply q1800   Continuous Infusions:    LOS: 7 days   Time spent: 25 minutes.  Patrecia Pour, MD [facility] [email or web address] 10/16/2019, 9:13 AM

## 2019-10-16 NOTE — Progress Notes (Signed)
[facility] for warfarin Indication: hx atrial fibrillation  Allergies  Allergen Reactions  . Chlorhexidine Hives  . Hydrocodone Nausea And Vomiting    Hydrocodone causes vomiting  . Oxycodone Nausea And Vomiting    Oral oxycodone causes vomiting  . Chlorhexidine Gluconate Rash    Sores at site  . Zoloft [Sertraline] Hives, Swelling and Rash   Patient Measurements: Height: 5\' 3"  (160 cm) Weight: 181 lb 7 oz (82.3 kg) IBW/kg (Calculated) : 52.4  Vital Signs: Temp: 98.2 F (36.8 C) (02/25 0640) BP: 112/57 (02/25 0640) Pulse Rate: 67 (02/25 0640)  Labs: Recent Labs    10/14/19 0251 10/15/19 0310 10/16/19 0226  HGB 11.7* 12.0  --   HCT 37.0 36.6  --   PLT 181 190  --   LABPROT 19.3* 17.8* 18.9*  INR 1.6* 1.5* 1.6*  CREATININE 0.40* 0.46 0.48   Estimated Creatinine Clearance: 64.6 mL/min (by C-G formula based on SCr of 0.48 mg/dL).  Medications:  PTA regimen: 5mg  daily except 2.5 mg on Mondays and Wednesdays--> per [facility] clinic visit on 10/08/2019.  However, patient said that since she got discharged from rehab facility (10/07/19), she has been using their medication dose packs with a warfarin regimen of 4mg  daily except 4.5 mg on Mon and Fri  Assessment: 74 y.o F with hx afib on warfarin on admission presented to the ED on 2/18 with ShOB.  Warfarin resumed on admission.  Today, 10/16/2019: - INR continues to be SUBtherapeutic but holding steady - note that doses on 2/20 and 2/21 were held due to INRs > 4 - CBC stable - drug-drug intxns: azith stopped today - no reported bleeding  Goal of Therapy:  INR 2-3 Monitor platelets by anticoagulation protocol: Yes   Plan:   Anticipate INR will continue to rise tomorrow with 5mg  warfarin given last 2 days - repeat warfarin 5mg  po today at 1800 x 1  Daily Protime/INR  Monitor po intake, s/s bleed   Adrian Saran, PharmD, BCPS  10/16/2019 8:01 AM

## 2019-10-16 NOTE — Plan of Care (Signed)
  Problem: Health Behavior/Discharge Planning: Goal: Ability to manage health-related needs will improve Outcome: Progressing   Problem: Activity: Goal: Risk for activity intolerance will decrease Outcome: Progressing   Problem: Safety: Goal: Ability to remain free from injury will improve Outcome: Progressing   Problem: Skin Integrity: Goal: Risk for impaired skin integrity will decrease Outcome: Progressing   Problem: Clinical Measurements: Goal: Respiratory complications will improve Outcome: Progressing Goal: Cardiovascular complication will be avoided Outcome: Progressing   Problem: Elimination: Goal: Will not experience complications related to bowel motility Outcome: Progressing Goal: Will not experience complications related to urinary retention Outcome: Progressing   Problem: Safety: Goal: Ability to remain free from injury will improve Outcome: Progressing

## 2019-10-16 NOTE — Progress Notes (Signed)
RN paged Tylene Fantasia, NP regarding patient's request to change code status from DNR to Full code.  RN notified [facility] of patient's request to change code status as well as the CN.

## 2019-10-16 NOTE — Progress Notes (Signed)
Off-going RN notified On-coming RN, Day shift CN, and Day shift AC that the patient is requesting to be made a Full Code and does not wish to be a DNR.

## 2019-10-16 NOTE — Progress Notes (Signed)
RN notified Dr. Bonner Puna that the patient is requesting to be made a Full Code and does not wish to be a DNR.

## 2019-10-16 NOTE — Progress Notes (Signed)
RN notified Dr. Humphrey Rolls, that the patient is requesting to made a Full Code and does not wish to be a DNR.

## 2019-10-17 ENCOUNTER — Telehealth: Payer: Self-pay | Admitting: Pharmacist

## 2019-10-17 LAB — BASIC METABOLIC PANEL
Anion gap: 9 (ref 5–15)
BUN: 23 mg/dL (ref 8–23)
CO2: 32 mmol/L (ref 22–32)
Calcium: 9 mg/dL (ref 8.9–10.3)
Chloride: 98 mmol/L (ref 98–111)
Creatinine, Ser: 0.35 mg/dL — ABNORMAL LOW (ref 0.44–1.00)
GFR calc Af Amer: >60 mL/min (ref >60)
GFR calc non Af Amer: >60 mL/min (ref >60)
Glucose, Bld: 90 mg/dL (ref 70–99)
Potassium: 3.7 mmol/L (ref 3.5–5.1)
Sodium: 139 mmol/L (ref 135–145)

## 2019-10-17 LAB — CBC
HCT: 37 % (ref 36.0–46.0)
Hemoglobin: 12.2 g/dL (ref 12.0–15.0)
MCH: 31.1 pg (ref 26.0–34.0)
MCHC: 33 g/dL (ref 30.0–36.0)
MCV: 94.4 fL (ref 80.0–100.0)
Platelets: 201 10*3/uL (ref 150–400)
RBC: 3.92 MIL/uL (ref 3.87–5.11)
RDW: 13.7 % (ref 11.5–15.5)
WBC: 7.2 10*3/uL (ref 4.0–10.5)
nRBC: 0 % (ref 0.0–0.2)

## 2019-10-17 LAB — PROTIME-INR
INR: 1.7 — ABNORMAL HIGH (ref 0.8–1.2)
Prothrombin Time: 19.5 seconds — ABNORMAL HIGH (ref 11.4–15.2)

## 2019-10-17 MED ORDER — WARFARIN SODIUM 5 MG PO TABS: 5.0000 mg | ORAL_TABLET | Freq: Once | ORAL | Status: DC

## 2019-10-17 MED ORDER — DILTIAZEM HCL 60 MG PO TABS: 60.0000 mg | ORAL_TABLET | Freq: Four times a day (QID) | ORAL | 0 refills | Status: DC | PRN

## 2019-10-17 MED ORDER — DILTIAZEM HCL ER COATED BEADS 180 MG PO CP24: 180.0000 mg | ORAL_CAPSULE | Freq: Every day | ORAL | 0 refills | Status: DC

## 2019-10-17 NOTE — Discharge Summary (Addendum)
Physician Discharge Summary  Toni [name] [id number] DOB: [birth year] DOA: 10/09/2019  PCP: Prince Solian, MD  Admit date: 10/09/2019 Discharge date: 10/17/2019  Admitted From: Home Discharge disposition: Home health, PT, OT, RN   Code Status: Full Code  Diet Recommendation: Cardiac diet   Recommendations for Outpatient Follow-Up:   1. Follow-up with PCP as an outpatient 2. Follow-up with cardiology as an outpatient  Discharge Diagnosis:   Principal Problem:   HCAP (healthcare-associated pneumonia) Active Problems:   Long term (current) use of anticoagulants   Atrial fibrillation with RVR (HCC)   Obstructive sleep apnea treated with bilevel positive airway pressure (BiPAP)   Acute respiratory failure with hypoxia (HCC)   Severe sepsis (HCC)    History of Present Illness / Brief narrative:  Toni [name] is a 74 y.o. female with a history of PAF, wheelchair-bound from multiple sclerosis, OSA, hypothyroidism, hypotension, and recent covid-19, UTI. Patient presented to the ED on 2/18 with dyspnea. She'd been in SNF or the hospital for months prior, only discharged from SNF 2 days before admission here.   The patient was recently hospitalized at [facility] from 08/22/19 through 08/29/19 for Covid pneumonia.  Not hospitalized and was complicated by UTI and persistent orthostatic hypotension.  Medications were adjusted.  Patient was started on midodrine and salt tablets.  Bystolic and Cardizem were stopped patient was started on low-dose metoprolol 12.5 mg twice daily. She was discharged to an SNF on 1/8 and subsequently she was discharged to SNF to home on 2/16 that is 2 days prior to this presentation. Over the 2 days at home, patient has shortness of breath, subjective fever.  EMS was called.  Patient was noted to be hypoxic in the 80s.  She was started on 10 L oxygen by nonrebreather and brought to the ED.  In the ED, chest x-ray showed interval development of bilateral  airspace opacities concerning for infiltrates, right greater than left.  EKG showed A fib with RVR and ventricular rate of 135  Patient was admitted to hospitalist medicine service for severe sepsis secondary to address A. fib with RVR.  Hospital Course:  Severe sepsis due to HCAP Acute respiratory failure with hypoxia -Patient was started on IV antibiotics and oxygen supplementation. -Cultures negative. -She completed the course of antibiotics.  Clinically improved.  Currently not on oxygen supplementation.  Persistent atrial fibrillation with RVR -In her recent hospitalization for Covid pneumonia, Bystolic and Cardizem was stopped and patient was discharged on metoprolol 12.5 mg twice daily. -Patient presented with RVR this time.  -Cardiology consultation was obtained.  Echocardiogram showed biatrial dilatation -Currently patient's heart rate is controlled on Cardizem long-acting 180 mg daily as well as as needed short acting Cardizem 60 mg every 6 hours for heart rate 110. -Patient understands the plan and she was able to verbalize that to me this morning. -Patient will continue anticoagulation with Coumadin. -Follow up with Dr. Debara Pickett is scheduled next week.  Acute on chronic HFpEF Chronic hypotension -EF 55 to 60%. -In her last hospitalization, patient had significant orthostatic hypotension related which she was placed on midodrine as well as scheduled salt tablets.  At the same time, she was also on Lasix 20 mg daily scheduled and 20 mg daily as needed. -Patient was volume overloaded at the time of this presentation. -She was diuresed with IV Lasix, Weight down 192lbs >> 181lbs.  -She is no longer on salt tablets.  She is currently on Lasix 20 mg twice daily and midodrine  5 mg 3 times daily. -We will discharge the patient on the same regimen.  Continue to monitor blood pressure, pedal edema and respiratory status at home.  - Follow-up with cardiology as an outpatient.  History  of multiple sclerosis Impaired mobility/wheelchair-bound -Chronically on corticotropin -Followed by Dr. [name] for baclofen pump, Dr. Brett Fairy for MS. -PT/OT has recommended return to SNF based on decreased independence, impaired functional status. -Patient does not want to go to SNF because of significant visitor restriction policy.  She strongly prefers to go home.  She apparently has arranged 24 hours care in addition to multiple home health services several times per week.  -Continue Neurontin, Xanax, baclofen, Wellbutrin, melatonin  Hypothyroidism -Continue Synthroid  OSA:  -Continue qHS NIPPV  Dyslipidemia:  -Continue statin  History of covid-19  -off isolation  Stable to discharge to home today with home health, RN, PT, OT.   Subjective:  Seen and examined this morning.  Pleasant elderly Caucasian female.  Not in distress . Alert, awake, oriented x3.  Discharge Exam:   Vitals:   10/17/19 0700 10/17/19 0716 10/17/19 1130 10/17/19 1325  BP:  (!) 104/57 126/77 (!) 111/55  Pulse:  71    Resp:  20    Temp:  97.7 F (36.5 C)    TempSrc:      SpO2:  100%  100%  Weight: 83.8 kg     Height:        Body mass index is 32.72 kg/m.  General exam: Appears calm and comfortable.  Skin: No rashes, lesions or ulcers. HEENT: Atraumatic, normocephalic, supple neck, no obvious bleeding Lungs: Clear to auscultation bilaterally CVS: Regular rate and rhythm, no murmur GI/Abd soft, nontender, nondistended, bowel sound present CNS: Alert, awake, oriented x3 Psychiatry: Mood appropriate Extremities: No pedal edema, no calf tenderness  Discharge Instructions:  Wound care: None Discharge Instructions    Diet - low sodium heart healthy   Complete by: As directed    Increase activity slowly   Complete by: As directed      Follow-up Information    Avva, Ravisankar, MD Follow up.   Specialty: Internal Medicine Contact information: [place]  [address] [phone]        Pixie Casino, MD .   Specialty: Cardiology Contact information: [address] [place] [address] [phone]          Allergies as of 10/17/2019      Reactions   Chlorhexidine Hives   Hydrocodone Nausea And Vomiting   Hydrocodone causes vomiting   Oxycodone Nausea And Vomiting   Oral oxycodone causes vomiting   Chlorhexidine Gluconate Rash   Sores at site   Zoloft [sertraline] Hives, Swelling, Rash      Medication List    STOP taking these medications   metoprolol tartrate 25 MG tablet Commonly known as: LOPRESSOR     TAKE these medications   acetaminophen 500 MG tablet Commonly known as: TYLENOL Take 500-1,000 mg by mouth every 6 (six) hours as needed for fever or headache (or pain).   Acthar 80 UNIT/ML injectable gel Generic drug: corticotropin INJECT 80 UNITS (1ML) DAILY FOR 10 DAYS. DUE TO MS EXACERBATION AND THEN CAN RESUME NEXT MONTH AT PREVIOUS DOSE. What changed:   how much to take  how to take this  when to take this  additional instructions   ALPRAZolam 0.5 MG tablet Commonly known as: XANAX Take 1 tablet (0.5 mg total) by mouth at bedtime as needed for  anxiety.   baclofen 20 MG tablet Commonly known as: LIORESAL Once to two tablets a day for breakthrough spasms What changed:   how much to take  how to take this  when to take this  reasons to take this  additional instructions   buPROPion 300 MG 24 hr tablet Commonly known as: Wellbutrin XL Take 1 tablet (300 mg total) by mouth daily.   Caltrate 600+D Plus Minerals 600-800 MG-UNIT Tabs Take 2 tablets by mouth 2 (two) times daily.   cetirizine 10 MG tablet Commonly known as: ZYRTEC Take 10 mg by mouth daily as needed for allergies.   Cranberry 1000 MG Caps Take 2 capsules by mouth 2 (two) times daily.   diltiazem 180 MG 24 hr capsule Commonly known as: CARDIZEM CD Take 1 capsule (180 mg total) by mouth daily. Start taking  on: October 18, 2019   diltiazem 60 MG tablet Commonly known as: CARDIZEM Take 1 tablet (60 mg total) by mouth every 6 (six) hours as needed for up to 7 days (HR sustained >110bpm).   docusate sodium 100 MG capsule Commonly known as: COLACE Take 1 capsule (100 mg total) by mouth daily.   famotidine 20 MG tablet Commonly known as: PEPCID Take 20 mg by mouth at bedtime.   fluticasone 50 MCG/ACT nasal spray Commonly known as: FLONASE Place 2 sprays into both nostrils daily as needed for allergies.   furosemide 20 MG tablet Commonly known as: LASIX Take 1 tablet by mouth daily, may take second dose if needed for excess edema   gabapentin 300 MG capsule Commonly known as: NEURONTIN TAKE 1 CAPSULE BY MOUTH UP  TO 4 TIMES DAILY What changed: See the new instructions.   Generlac 10 GM/15ML Soln Generic drug: lactulose (encephalopathy) TAKE 15 ML BY MOUTH TWICE DAILY AS NEEDED What changed: See the new instructions.   levothyroxine 75 MCG tablet Commonly known as: SYNTHROID Take 75 mcg by mouth daily.   Melatonin 3 MG Tabs Take 1 tablet (3 mg total) by mouth at bedtime as needed (sleep).   midodrine 5 MG tablet Commonly known as: PROAMATINE Take 1 tablet (5 mg total) by mouth 3 (three) times daily with meals.   omeprazole 20 MG capsule Commonly known as: PRILOSEC Take 20 mg by mouth daily.   ondansetron 4 MG tablet Commonly known as: ZOFRAN Take 4 mg by mouth every 8 (eight) hours as needed for nausea or vomiting.   promethazine 25 MG suppository Commonly known as: PHENERGAN Place 25 mg rectally every 6 (six) hours as needed for nausea or vomiting.   psyllium 28 % packet Commonly known as: METAMUCIL SMOOTH TEXTURE Take 1 packet by mouth daily.   simvastatin 10 MG tablet Commonly known as: ZOCOR Take 10 mg by mouth every evening.   sodium chloride 1 g tablet Take 1 tablet (1 g total) by mouth 2 (two) times daily with a meal.   SYRINGE/NEEDLE (DISP) 1 ML 25G X  5/8" 1 ML Misc Commonly known as: BD Luer-Lok Syringe Use as directed to inject  Acthar   tamsulosin 0.4 MG Caps capsule Commonly known as: FLOMAX Take 0.4 mg by mouth daily as needed (bladder emptying).   Vitamin C 500 MG Caps Take 500 mg by mouth daily.   Vitamin D 50 MCG (2000 UT) tablet Take 1 tablet (2,000 Units total) by mouth 2 (two) times daily.   WARFARIN SODIUM PO Take as directed. If you are unsure how to take this medication, talk to your  nurse or doctor. Original instructions: Take 4-4.5 mg by mouth See admin instructions. Take 4.5 mg once daily on Monday and Friday.  All other days of the week, take 4 mg.   warfarin 5 MG tablet Commonly known as: COUMADIN Take as directed. If you are unsure how to take this medication, talk to your nurse or doctor. Original instructions: Take 1/2 to 1 tablet by mouth daily as directed by coumadin clinic.   zinc sulfate 220 (50 Zn) MG capsule Take 1 capsule (220 mg total) by mouth daily.       Time coordinating discharge: 35 minutes  The results of significant diagnostics from this hospitalization (including imaging, microbiology, ancillary and laboratory) are listed below for reference.    Procedures and Diagnostic Studies:   DG Chest Port 1 View  Result Date: 10/09/2019 CLINICAL DATA:  Shortness of breath. EXAM: PORTABLE CHEST 1 VIEW COMPARISON:  08/22/2019. FINDINGS: Cardiomegaly. Diffuse bilateral pulmonary infiltrates/edema, right side greater than left. Kerley B-lines noted. These findings suggest CHF. Pneumonia cannot be excluded. No prominent pleural effusion. No pneumothorax. Degenerative changes and scoliosis thoracic spine. IMPRESSION: Cardiomegaly with diffuse bilateral pulmonary infiltrates/edema, right side greater than left. Findings suggest CHF. Bilateral pneumonia cannot be excluded. Electronically Signed   By: Marcello Moores  Register   On: 10/09/2019 15:43   ECHOCARDIOGRAM COMPLETE  Result Date: 10/10/2019     ECHOCARDIOGRAM REPORT   Patient Name:   Toni [name] Date of Exam: 10/10/2019 Medical Rec #:  [id number]        Height:       63.0 in Accession #:    [id number]       Weight:       191.8 lb Date of Birth:  [birth year]         BSA:          1.90 m Patient Age:    17 years         BP:           112/45 mmHg Patient Gender: F                HR:           88 bpm. Exam Location:  Inpatient Procedure: 2D Echo, Cardiac Doppler and Color Doppler Indications:    I50.33 Acute on chronic diastolic (congestive) heart failure  History:        Patient has prior history of Echocardiogram examinations, most                 recent 03/25/2019. Arrythmias:Atrial Fibrillation,                 Signs/Symptoms:Dyspnea; Risk Factors:Sleep Apnea, Hypertension                 and Dyslipidemia. COVID-19 Positive. Thyroid Disease. GERD.  Sonographer:    Tiffany Dance Referring Phys: [id number] [employer]  1. Left ventricular ejection fraction, by estimation, is 55 to 60%. The left ventricle has normal function. The left ventricle has no regional wall motion abnormalities. Left ventricular diastolic function could not be evaluated.  2. Right ventricular systolic function is normal. The right ventricular size is normal. There is normal pulmonary artery systolic pressure. The estimated right ventricular systolic pressure is [id number] mmHg.  3. Left atrial size was severely dilated.  4. Right atrial size was mild to moderately dilated.  5. The mitral valve is grossly normal. Mild to moderate mitral valve regurgitation.  6. The aortic valve is tricuspid. Aortic valve  regurgitation is not visualized. No aortic stenosis is present.  7. The inferior vena cava is dilated in size with <50% respiratory variability, suggesting right atrial pressure of 15 mmHg. Comparison(s): A prior study was performed on 03/25/2019. No significant change from prior study. FINDINGS  Left Ventricle: Left ventricular ejection fraction, by estimation, is 55 to 60%. The  left ventricle has normal function. The left ventricle has no regional wall motion abnormalities. The left ventricular internal cavity size was normal in size. There is  no left ventricular hypertrophy. Left ventricular diastolic function could not be evaluated due to atrial fibrillation. Left ventricular diastolic function could not be evaluated. Right Ventricle: The right ventricular size is normal. No increase in right ventricular wall thickness. Right ventricular systolic function is normal. There is normal pulmonary artery systolic pressure. The tricuspid regurgitant velocity is 2.18 m/s, and  with an assumed right atrial pressure of 15 mmHg, the estimated right ventricular systolic pressure is [id number] mmHg. Left Atrium: Left atrial size was severely dilated. Right Atrium: Right atrial size was mild to moderately dilated. Pericardium: A small pericardial effusion is present. The pericardial effusion is circumferential. Presence of pericardial fat pad. Mitral Valve: The mitral valve is grossly normal. Mild to moderate mitral valve regurgitation. Tricuspid Valve: The tricuspid valve is grossly normal. Tricuspid valve regurgitation is mild. Aortic Valve: The aortic valve is tricuspid. Aortic valve regurgitation is not visualized. No aortic stenosis is present. Pulmonic Valve: The pulmonic valve was grossly normal. Pulmonic valve regurgitation is trivial. Aorta: The aortic root and ascending aorta are structurally normal, with no evidence of dilitation. Venous: The inferior vena cava is dilated in size with less than 50% respiratory variability, suggesting right atrial pressure of 15 mmHg. IAS/Shunts: No atrial level shunt detected by color flow Doppler.  LEFT VENTRICLE PLAX 2D LVIDd:         5.40 cm LVIDs:         3.30 cm LV PW:         1.30 cm LV IVS:        0.80 cm LVOT diam:     1.80 cm LV SV:         52.93 ml LV SV Index:   48.51 LVOT Area:     2.54 cm  RIGHT VENTRICLE             IVC RV Basal diam:  3.00 cm      IVC diam: 2.20 cm RV S prime:     10.30 cm/s TAPSE (M-mode): 2.1 cm LEFT ATRIUM             Index       RIGHT ATRIUM           Index LA diam:        3.80 cm 2.00 cm/m  RA Area:     19.70 cm LA Vol (A2C):   66.3 ml 34.90 ml/m RA Volume:   49.10 ml  25.85 ml/m LA Vol (A4C):   46.7 ml 24.58 ml/m LA Biplane Vol: 55.4 ml 29.16 ml/m  AORTIC VALVE LVOT Vmax:   108.50 cm/s LVOT Vmean:  73.800 cm/s LVOT VTI:    0.208 m  AORTA Ao Root diam: 3.00 cm Ao Asc diam:  2.60 cm MITRAL VALVE                TRICUSPID VALVE MV Area (PHT): 3.81 cm     TR Peak grad:   19.0 mmHg MV Decel Time: 199 msec  TR Vmax:        218.00 cm/s MV E velocity: 118.50 cm/s                             SHUNTS                             Systemic VTI:  0.21 m                             Systemic Diam: 1.80 cm Eleonore Chiquito MD Electronically signed by Eleonore Chiquito MD Signature Date/Time: 10/10/2019/2:37:28 PM    Final      Labs:   Basic Metabolic Panel: Recent Labs  Lab 10/11/19 0256 10/12/19 [id number] 10/13/19 0345 10/13/19 0345 10/14/19 0251 10/14/19 0251 10/15/19 0310 10/15/19 0310 10/16/19 0226 10/17/19 0240  NA 138   < > 141  --  141  --  141  --  140 139  K 3.6   < > 4.1   < > 4.4   < > 3.9   < > 4.8 3.7  CL 100   < > 101  --  100  --  99  --  98 98  CO2 30   < > 32  --  34*  --  34*  --  32 32  GLUCOSE 84   < > 78  --  85  --  89  --  93 90  BUN 23   < > 15  --  16  --  22  --  27* 23  CREATININE 0.36*   < > 0.44  --  0.40*  --  0.46  --  0.48 0.35*  CALCIUM 8.2*   < > 8.8*  --  8.9  --  8.9  --  9.2 9.0  MG 2.1  --   --   --  2.1  --  2.0  --   --   --   PHOS 3.3  --   --   --  4.0  --  4.3  --   --   --    < > = values in this interval not displayed.   GFR Estimated Creatinine Clearance: 65.2 mL/min (A) (by C-G formula based on SCr of 0.35 mg/dL (L)). Liver Function Tests: Recent Labs  Lab 10/11/19 0256 10/14/19 0251 10/15/19 0310 10/16/19 0226  AST 13* 19 14* 19  ALT 16 24 19 17   ALKPHOS 44 55 48 49   BILITOT 0.8 0.8 0.7 0.6  PROT 5.5* 5.7* 5.7* 6.0*  ALBUMIN 3.0* 3.0* 2.9* 3.1*   No results for input(s): LIPASE, AMYLASE in the last 168 hours. No results for input(s): AMMONIA in the last 168 hours. Coagulation profile Recent Labs  Lab 10/13/19 0345 10/14/19 0251 10/15/19 0310 10/16/19 0226 10/17/19 0240  INR 2.3* 1.6* 1.5* 1.6* 1.7*    CBC: Recent Labs  Lab 10/12/19 0358 10/13/19 0345 10/14/19 0251 10/15/19 0310 10/17/19 0240  WBC 8.1 7.8 7.2 7.2 7.2  NEUTROABS 4.3 3.7  --   --   --   HGB 12.1 12.0 11.7* 12.0 12.2  HCT 37.2 37.4 37.0 36.6 37.0  MCV 95.9 96.9 98.1 96.1 94.4  PLT 160 179 181 190 201   Cardiac Enzymes: No results for input(s): CKTOTAL, CKMB, CKMBINDEX, TROPONINI in the last 168 hours. BNP: Invalid input(s): POCBNP  CBG: No results for input(s): GLUCAP in the last 168 hours. D-Dimer No results for input(s): DDIMER in the last 72 hours. Hgb A1c No results for input(s): HGBA1C in the last 72 hours. Lipid Profile No results for input(s): CHOL, HDL, LDLCALC, TRIG, CHOLHDL, LDLDIRECT in the last 72 hours. Thyroid function studies No results for input(s): TSH, T4TOTAL, T3FREE, THYROIDAB in the last 72 hours.  Invalid input(s): FREET3 Anemia work up No results for input(s): VITAMINB12, FOLATE, FERRITIN, TIBC, IRON, RETICCTPCT in the last 72 hours. Microbiology Recent Results (from the past 240 hour(s))  MRSA PCR Screening     Status: None   Collection Time: 10/09/19  7:46 PM   Specimen: Nasal Mucosa; Nasopharyngeal  Result Value Ref Range Status   MRSA by PCR NEGATIVE NEGATIVE Final    Comment:        The GeneXpert MRSA Assay (FDA approved for NASAL specimens only), is one component of a comprehensive MRSA colonization surveillance program. It is not intended to diagnose MRSA infection nor to guide or monitor treatment for MRSA infections. Performed at [facility], [place] [address] [place], [place] [address]   Culture,  blood (Routine X 2) w Reflex to ID Panel     Status: None   Collection Time: 10/09/19  9:04 PM   Specimen: BLOOD  Result Value Ref Range Status   Specimen Description   Final    BLOOD BLOOD RIGHT WRIST Performed at [facility] [place] [address] [place], [place] [address]    Special Requests   Final    BOTTLES DRAWN AEROBIC ONLY Blood Culture results may not be optimal due to an excessive volume of blood received in culture bottles Performed at [facility] [address], [place], [place] [address]    Culture   Final    NO GROWTH 5 DAYS Performed at [facility], [employer] [address] [place], [place] [address]    Report Status 10/14/2019 FINAL  Final  Culture, blood (Routine X 2) w Reflex to ID Panel     Status: None   Collection Time: 10/09/19  9:04 PM   Specimen: BLOOD  Result Value Ref Range Status   Specimen Description   Final    BLOOD BLOOD RIGHT ARM Performed at [facility] [address], [place], [place] [address]    Special Requests   Final    BOTTLES DRAWN AEROBIC AND ANAEROBIC Blood Culture results may not be optimal due to an excessive volume of blood received in culture bottles Performed at [facility] [address] [place], [place] [address]    Culture   Final    NO GROWTH 5 DAYS Performed at [facility], [place] [address] [place],  [address]    Report Status 10/14/2019 FINAL  Final    Please note: You were cared for by a hospitalist during your hospital stay. Once you are discharged, your primary care physician will handle any further medical issues. Please note that NO REFILLS for any discharge medications will be authorized once you are discharged, as it is imperative that you return to your primary care physician (or establish a relationship with a primary care physician if you do not have one) for your post hospital discharge needs so that they can reassess your  need for medications and monitor your lab values.  Signed: Terrilee Croak  [facility] 10/17/2019, 2:01 PM

## 2019-10-17 NOTE — Progress Notes (Signed)
[facility] for warfarin Indication: hx atrial fibrillation  Allergies  Allergen Reactions  . Chlorhexidine Hives  . Hydrocodone Nausea And Vomiting    Hydrocodone causes vomiting  . Oxycodone Nausea And Vomiting    Oral oxycodone causes vomiting  . Chlorhexidine Gluconate Rash    Sores at site  . Zoloft [Sertraline] Hives, Swelling and Rash   Patient Measurements: Height: 5\' 3"  (160 cm) Weight: 184 lb 11.2 oz (83.8 kg) IBW/kg (Calculated) : 52.4  Vital Signs: Temp: 97.7 F (36.5 C) (02/26 0716) BP: 104/57 (02/26 0716) Pulse Rate: 71 (02/26 0716)  Labs: Recent Labs    10/15/19 0310 10/16/19 0226 10/17/19 0240  HGB 12.0  --  12.2  HCT 36.6  --  37.0  PLT 190  --  201  LABPROT 17.8* 18.9* 19.5*  INR 1.5* 1.6* 1.7*  CREATININE 0.46 0.48 0.35*   Estimated Creatinine Clearance: 65.2 mL/min (A) (by C-G formula based on SCr of 0.35 mg/dL (L)).  Medications:  PTA regimen: 5mg  daily except 2.5 mg on Mondays and Wednesdays--> per [facility] clinic visit on 10/08/2019.  However, patient said that since she got discharged from rehab facility (10/07/19), she has been using their medication dose packs with a warfarin regimen of 4mg  daily except 4.5 mg on Mon and Fri  Assessment: 74 y.o F with hx afib on warfarin on admission presented to the ED on 2/18 with ShOB.  Warfarin resumed on admission.  Today, 10/17/2019: - INR continues to be SUBtherapeutic but holding steady - note that doses on 2/20 and 2/21 were held due to INRs > 4 - CBC stable - drug-drug intxns: azith stopped 2/25 - no reported bleeding  Goal of Therapy:  INR 2-3 Monitor platelets by anticoagulation protocol: Yes   Plan:   Anticipate INR will continue to rise tomorrow with 5mg  warfarin given last 3 days - repeat warfarin 5mg  po today at 1800 x 1  Daily Protime/INR  Monitor po intake, s/s bleed   Dolly Rias RPh 10/17/2019, 9:29 AM

## 2019-10-17 NOTE — Progress Notes (Addendum)
Occupational Therapy Treatment Patient Details Name: FRANCHESTER BARR MRN: [id number] DOB: 08-01-1946 Today's Date: 10/17/2019    History of present illness Pt admitted with resp failure with sepsis 2* PNA;  Pt recently dc for rehab setting at [facility] following [place] dx in January.  Pt with hx of MS, PAF, A-fib with RVR, s/p cervical fusion, back surgery and Baclofen INfusion Pain pump implantation   OT comments  Patient verbalized that she was laying in bed and was soaking wet from urine. Patient required Moderate assist for rolling side to side with use of bed rails. Patient was able to assist with perihygiene in front, but not back. Patient required extra time to complete self-care tasks. Patient required total assist for donning of B grip socks and setup for grooming at bed level. Patient verbalized increased pain in B LE which was alleviated by repositioning with elevation. BP dropped to 99/59 after rolling side to side, heart rate 70bpm. Patient will benefit from continued skilled acute care OT services.    Follow Up Recommendations  Home health OT;Supervision/Assistance - 24 hour    Equipment Recommendations  None recommended by OT    Recommendations for Other Services      Precautions / Restrictions Precautions Precautions: Fall Precaution Comments: monitor HR Restrictions Weight Bearing Restrictions: No       Mobility Bed Mobility Overal bed mobility: Needs Assistance Bed Mobility: Rolling Rolling: Mod assist            Transfers                      Balance   Sitting-balance support: No upper extremity supported Sitting balance-Leahy Scale: Fair Sitting balance - Comments: increased use of UEs with increased fatigue                                   ADL either performed or assessed with clinical judgement   ADL       Grooming: Set up;Oral care;Wash/dry face;Wash/dry hands;Brushing hair       Lower Body Bathing: Maximal  assistance       Lower Body Dressing: Total assistance                       Vision       Perception     Praxis      Cognition Arousal/Alertness: Awake/alert Behavior During Therapy: WFL for tasks assessed/performed Overall Cognitive Status: Within Functional Limits for tasks assessed                                          Exercises     Shoulder Instructions       General Comments      Pertinent Vitals/ Pain       Pain Assessment: 0-10 Pain Score: 3  Pain Location: B LE  Pain Descriptors / Indicators: Sore Pain Intervention(s): Repositioned  Home Living                                          Prior Functioning/Environment              Frequency  Progress Toward Goals  OT Goals(current goals can now be found in the care plan section)  Progress towards OT goals: Progressing toward goals     Plan      Co-evaluation                 AM-PAC OT "6 Clicks" Daily Activity     Outcome Measure   Help from another person eating meals?: None Help from another person taking care of personal grooming?: A Little Help from another person toileting, which includes using toliet, bedpan, or urinal?: Total Help from another person bathing (including washing, rinsing, drying)?: A Lot Help from another person to put on and taking off regular upper body clothing?: A Little Help from another person to put on and taking off regular lower body clothing?: Total 6 Click Score: 14    End of Session        Activity Tolerance Patient tolerated treatment well   Patient Left with call bell/phone within reach;in bed   Nurse Communication Mobility status        Time: [id number] OT Time Calculation (min): 39 min  Charges: OT General Charges $OT Visit: 1 Visit OT Treatments $Self Care/Home Management : 23-37 mins $Therapeutic Activity: 8-22 mins  Kamarrion Stfort OTR/L    Marlaysia Lenig 10/17/2019, 1:12 PM

## 2019-10-17 NOTE — Discharge Instructions (Signed)
Atrial Fibrillation  Atrial fibrillation is a type of heartbeat that is irregular or fast. If you have this condition, your heart beats without any order. This makes it hard for your heart to pump blood in a normal way. Atrial fibrillation may come and go, or it may become a long-lasting problem. If this condition is not treated, it can put you at higher risk for stroke, heart failure, and other heart problems. What are the causes? This condition may be caused by diseases that damage the heart. They include:  High blood pressure.  Heart failure.  Heart valve disease.  Heart surgery. Other causes include:  Diabetes.  Thyroid disease.  Being overweight.  Kidney disease. Sometimes the cause is not known. What increases the risk? You are more likely to develop this condition if:  You are older.  You smoke.  You exercise often and very hard.  You have a family history of this condition.  You are a man.  You use drugs.  You drink a lot of alcohol.  You have lung conditions, such as emphysema, pneumonia, or COPD.  You have sleep apnea. What are the signs or symptoms? Common symptoms of this condition include:  A feeling that your heart is beating very fast.  Chest pain or discomfort.  Feeling short of breath.  Suddenly feeling light-headed or weak.  Getting tired easily during activity.  Fainting.  Sweating. In some cases, there are no symptoms. How is this treated? Treatment for this condition depends on underlying conditions and how you feel when you have atrial fibrillation. They include:  Medicines to: ? Prevent blood clots. ? Treat heart rate or heart rhythm problems.  Using devices, such as a pacemaker, to correct heart rhythm problems.  Doing surgery to remove the part of the heart that sends bad signals.  Closing an area where clots can form in the heart (left atrial appendage). In some cases, your doctor will treat other underlying  conditions. Follow these instructions at home: Medicines  Take over-the-counter and prescription medicines only as told by your doctor.  Do not take any new medicines without first talking to your doctor.  If you are taking blood thinners: ? Talk with your doctor before you take any medicines that have aspirin or NSAIDs, such as ibuprofen, in them. ? Take your medicine exactly as told by your doctor. Take it at the same time each day. ? Avoid activities that could hurt or bruise you. Follow instructions about how to prevent falls. ? Wear a bracelet that says you are taking blood thinners. Or, carry a card that lists what medicines you take. Lifestyle      Do not use any products that have nicotine or tobacco in them. These include cigarettes, e-cigarettes, and chewing tobacco. If you need help quitting, ask your doctor.  Eat heart-healthy foods. Talk with your doctor about the right eating plan for you.  Exercise regularly as told by your doctor.  Do not drink alcohol.  Lose weight if you are overweight.  Do not use drugs, including cannabis. General instructions  If you have a condition that causes breathing to stop for a short period of time (apnea), treat it as told by your doctor.  Keep a healthy weight. Do not use diet pills unless your doctor says they are safe for you. Diet pills may make heart problems worse.  Keep all follow-up visits as told by your doctor. This is important. Contact a doctor if:  You notice a change   in the speed, rhythm, or strength of your heartbeat.  You are taking a blood-thinning medicine and you get more bruising.  You get tired more easily when you move or exercise.  You have a sudden change in weight. Get help right away if:   You have pain in your chest or your belly (abdomen).  You have trouble breathing.  You have side effects of blood thinners, such as blood in your vomit, poop (stool), or pee (urine), or bleeding that cannot  stop.  You have any signs of a stroke. "BE FAST" is an easy way to remember the main warning signs: ? B - Balance. Signs are dizziness, sudden trouble walking, or loss of balance. ? E - Eyes. Signs are trouble seeing or a change in how you see. ? F - Face. Signs are sudden weakness or loss of feeling in the face, or the face or eyelid drooping on one side. ? A - Arms. Signs are weakness or loss of feeling in an arm. This happens suddenly and usually on one side of the body. ? S - Speech. Signs are sudden trouble speaking, slurred speech, or trouble understanding what people say. ? T - Time. Time to call emergency services. Write down what time symptoms started.  You have other signs of a stroke, such as: ? A sudden, very bad headache with no known cause. ? Feeling like you may vomit (nausea). ? Vomiting. ? A seizure. These symptoms may be an emergency. Do not wait to see if the symptoms will go away. Get medical help right away. Call your local emergency services (911 in the [place] Do not drive yourself to the hospital. Summary  Atrial fibrillation is a type of heartbeat that is irregular or fast.  You are at higher risk of this condition if you smoke, are older, have diabetes, or are overweight.  Follow your doctor's instructions about medicines, diet, exercise, and follow-up visits.  Get help right away if you have signs or symptoms of a stroke.  Get help right away if you cannot catch your breath, or you have chest pain or discomfort. This information is not intended to replace advice given to you by your health care provider. Make sure you discuss any questions you have with your health care provider. Document Revised: 01/29/2019 Document Reviewed: 01/29/2019 Elsevier Patient Education  [place] Pneumonia, Adult Pneumonia is an infection of the lungs. It causes swelling in the airways of the lungs. Mucus and fluid may also build up inside the  airways. One type of pneumonia can happen while a person is in a hospital. A different type can happen when a person is not in a hospital (community-acquired pneumonia).  What are the causes?  This condition is caused by germs (viruses, bacteria, or fungi). Some types of germs can be passed from one person to another. This can happen when you breathe in droplets from the cough or sneeze of an infected person. What increases the risk? You are more likely to develop this condition if you:  Have a long-term (chronic) disease, such as: ? Chronic obstructive pulmonary disease (COPD). ? Asthma. ? Cystic fibrosis. ? Congestive heart failure. ? Diabetes. ? Kidney disease.  Have HIV.  Have sickle cell disease.  Have had your spleen removed.  Do not take good care of your teeth and mouth (poor dental hygiene).  Have a medical condition that increases the risk of breathing in droplets from your own mouth and nose.  Have a weakened body defense system (immune system).  Are a smoker.  Travel to areas where the germs that cause this illness are common.  Are around certain animals or the places they live. What are the signs or symptoms?  A dry cough.  A wet (productive) cough.  Fever.  Sweating.  Chest pain. This often happens when breathing deeply or coughing.  Fast breathing or trouble breathing.  Shortness of breath.  Shaking chills.  Feeling tired (fatigue).  Muscle aches. How is this treated? Treatment for this condition depends on many things. Most adults can be treated at home. In some cases, treatment must happen in a hospital. Treatment may include:  Medicines given by mouth or through an IV tube.  Being given extra oxygen.  Respiratory therapy. In rare cases, treatment for very bad pneumonia may include:  Using a machine to help you breathe.  Having a procedure to remove fluid from around your lungs. Follow these instructions at home: Medicines  Take  over-the-counter and prescription medicines only as told by your doctor. ? Only take cough medicine if you are losing sleep.  If you were prescribed an antibiotic medicine, take it as told by your doctor. Do not stop taking the antibiotic even if you start to feel better. General instructions   Sleep with your head and neck raised (elevated). You can do this by sleeping in a recliner or by putting a few pillows under your head.  Rest as needed. Get at least 8 hours of sleep each night.  Drink enough water to keep your pee (urine) pale yellow.  Eat a healthy diet that includes plenty of vegetables, fruits, whole grains, low-fat dairy products, and lean protein.  Do not use any products that contain nicotine or tobacco. These include cigarettes, e-cigarettes, and chewing tobacco. If you need help quitting, ask your doctor.  Keep all follow-up visits as told by your doctor. This is important. How is this prevented? A shot (vaccine) can help prevent pneumonia. Shots are often suggested for:  People older than 73 years of age.  People older than 73 years of age who: ? Are having cancer treatment. ? Have long-term (chronic) lung disease. ? Have problems with their body's defense system. You may also prevent pneumonia if you take these actions:  Get the flu (influenza) shot every year.  Go to the dentist as often as told.  Wash your hands often. If you cannot use soap and water, use hand sanitizer. Contact a doctor if:  You have a fever.  You lose sleep because your cough medicine does not help. Get help right away if:  You are short of breath and it gets worse.  You have more chest pain.  Your sickness gets worse. This is very serious if: ? You are an older adult. ? Your body's defense system is weak.  You cough up blood. Summary  Pneumonia is an infection of the lungs.  Most adults can be treated at home. Some will need treatment in a hospital.  Drink enough water  to keep your pee pale yellow.  Get at least 8 hours of sleep each night. This information is not intended to replace advice given to you by your health care provider. Make sure you discuss any questions you have with your health care provider. Document Revised: 11/27/2018 Document Reviewed: 04/04/2018 Elsevier Patient Education  [place].   Atrial Fibrillation  Atrial fibrillation is a type of irregular or rapid heartbeat (arrhythmia). In atrial  fibrillation, the top part of the heart (atria) beats in an irregular pattern. This makes the heart unable to pump blood normally and effectively. The goal of treatment is to prevent blood clots from forming, control your heart rate, or restore your heartbeat to a normal rhythm. If this condition is not treated, it can cause serious problems, such as a weakened heart muscle (cardiomyopathy) or a stroke. What are the causes? This condition is often caused by medical conditions that damage the heart's electrical system. These include:  High blood pressure (hypertension). This is the most common cause.  Certain heart problems or conditions, such as heart failure, coronary artery disease, heart valve problems, or heart surgery.  Diabetes.  Overactive thyroid (hyperthyroidism).  Obesity.  Chronic kidney disease. In some cases, the cause of this condition is not known. What increases the risk? This condition is more likely to develop in:  Older people.  People who smoke.  Athletes who do endurance exercise.  People who have a family history of atrial fibrillation.  Men.  People who use drugs.  People who drink a lot of alcohol.  People who have lung conditions, such as emphysema, pneumonia, or COPD.  People who have obstructive sleep apnea. What are the signs or symptoms? Symptoms of this condition include:  A feeling that your heart is racing or beating irregularly.  Discomfort or pain in your chest.  Shortness of  breath.  Sudden light-headedness or weakness.  Tiring easily during exercise or activity.  Fatigue.  Syncope (fainting).  Sweating. In some cases, there are no symptoms. How is this diagnosed? Your health care provider may detect atrial fibrillation when taking your pulse. If detected, this condition may be diagnosed with:  An electrocardiogram (ECG) to check electrical signals of the heart.  An ambulatory cardiac monitor to record your heart's activity for a few days.  A transthoracic echocardiogram (TTE) to create pictures of your heart.  A transesophageal echocardiogram (TEE) to create even closer pictures of your heart.  A stress test to check your blood supply while you exercise.  Imaging tests, such as a CT scan or chest X-ray.  Blood tests. How is this treated? Treatment depends on underlying conditions and how you feel when you experience atrial fibrillation. This condition may be treated with:  Medicines to prevent blood clots or to treat heart rate or heart rhythm problems.  Electrical cardioversion to reset the heart's rhythm.  A pacemaker to correct abnormal heart rhythm.  Ablation to remove the heart tissue that sends abnormal signals.  Left atrial appendage closure to seal the area where blood clots can form. In some cases, underlying conditions will be treated. Follow these instructions at home: Medicines  Take over-the counter and prescription medicines only as told by your health care provider.  Do not take any new medicines without talking to your health care provider.  If you are taking blood thinners: ? Talk with your health care provider before you take any medicines that contain aspirin or NSAIDs, such as ibuprofen. These medicines increase your risk for dangerous bleeding. ? Take your medicine exactly as told, at the same time every day. ? Avoid activities that could cause injury or bruising, and follow instructions about how to prevent  falls. ? Wear a medical alert bracelet or carry a card that lists what medicines you take. Lifestyle      Do not use any products that contain nicotine or tobacco, such as cigarettes, e-cigarettes, and chewing tobacco. If you need  help quitting, ask your health care provider.  Eat heart-healthy foods. Talk with a dietitian to make an eating plan that is right for you.  Exercise regularly as told by your health care provider.  Do not drink alcohol.  Lose weight if you are overweight.  Do not use drugs, including cannabis. General instructions  If you have obstructive sleep apnea, manage your condition as told by your health care provider.  Do not use diet pills unless your health care provider approves. Diet pills can make heart problems worse.  Keep all follow-up visits as told by your health care provider. This is important. Contact a health care provider if you:  Notice a change in the rate, rhythm, or strength of your heartbeat.  Are taking a blood thinner and you notice more bruising.  Tire more easily when you exercise or do heavy work.  Have a sudden change in weight. Get help right away if you have:   Chest pain, abdominal pain, sweating, or weakness.  Trouble breathing.  Side effects of blood thinners, such as blood in your vomit, stool, or urine, or bleeding that cannot stop.  Any symptoms of a stroke. "BE FAST" is an easy way to remember the main warning signs of a stroke: ? B - Balance. Signs are dizziness, sudden trouble walking, or loss of balance. ? E - Eyes. Signs are trouble seeing or a sudden change in vision. ? F - Face. Signs are sudden weakness or numbness of the face, or the face or eyelid drooping on one side. ? A - Arms. Signs are weakness or numbness in an arm. This happens suddenly and usually on one side of the body. ? S - Speech. Signs are sudden trouble speaking, slurred speech, or trouble understanding what people say. ? T - Time. Time to  call emergency services. Write down what time symptoms started.  Other signs of a stroke, such as: ? A sudden, severe headache with no known cause. ? Nausea or vomiting. ? Seizure. These symptoms may represent a serious problem that is an emergency. Do not wait to see if the symptoms will go away. Get medical help right away. Call your local emergency services (911 in the [place] Do not drive yourself to the hospital. Summary  Atrial fibrillation is a type of irregular or rapid heartbeat (arrhythmia).  Symptoms include a feeling that your heart is beating fast or irregularly.  You may be given medicines to prevent blood clots or to treat heart rate or heart rhythm problems.  Get help right away if you have signs or symptoms of a stroke.  Get help right away if you cannot catch your breath or have chest pain or pressure. This information is not intended to replace advice given to you by your health care provider. Make sure you discuss any questions you have with your health care provider. Document Revised: 01/29/2019 Document Reviewed: 01/29/2019 Elsevier Patient Education  [place].

## 2019-10-17 NOTE — Telephone Encounter (Signed)
Patient going home today from the hospital  Instructions to take 5mg  daily for next 3 days given to patient.  Order to repeat INR on Monday 3/1 or Tuesday 3/2 given to [employer] from [facility].

## 2019-10-20 ENCOUNTER — Ambulatory Visit (INDEPENDENT_AMBULATORY_CARE_PROVIDER_SITE_OTHER): Payer: Medicare Other | Admitting: Cardiovascular Disease

## 2019-10-20 ENCOUNTER — Telehealth: Payer: Self-pay

## 2019-10-20 DIAGNOSIS — I5032 Chronic diastolic (congestive) heart failure: Secondary | ICD-10-CM | POA: Diagnosis not present

## 2019-10-20 DIAGNOSIS — M62838 Other muscle spasm: Secondary | ICD-10-CM | POA: Diagnosis not present

## 2019-10-20 DIAGNOSIS — I4811 Longstanding persistent atrial fibrillation: Secondary | ICD-10-CM | POA: Diagnosis not present

## 2019-10-20 DIAGNOSIS — Z7901 Long term (current) use of anticoagulants: Secondary | ICD-10-CM | POA: Diagnosis not present

## 2019-10-20 DIAGNOSIS — G35 Multiple sclerosis: Secondary | ICD-10-CM | POA: Diagnosis not present

## 2019-10-20 DIAGNOSIS — I110 Hypertensive heart disease with heart failure: Secondary | ICD-10-CM | POA: Diagnosis not present

## 2019-10-20 DIAGNOSIS — M170 Bilateral primary osteoarthritis of knee: Secondary | ICD-10-CM | POA: Diagnosis not present

## 2019-10-20 LAB — POCT INR: INR: 2.1 (ref 2.0–3.0)

## 2019-10-20 NOTE — Telephone Encounter (Signed)
Pt called wanting to speak to a pharmacist regarding medicine questions. The pt left a voicemail and didn't specify which medication.

## 2019-10-21 DIAGNOSIS — I110 Hypertensive heart disease with heart failure: Secondary | ICD-10-CM | POA: Diagnosis not present

## 2019-10-21 DIAGNOSIS — M170 Bilateral primary osteoarthritis of knee: Secondary | ICD-10-CM | POA: Diagnosis not present

## 2019-10-21 DIAGNOSIS — M62838 Other muscle spasm: Secondary | ICD-10-CM | POA: Diagnosis not present

## 2019-10-21 DIAGNOSIS — G35 Multiple sclerosis: Secondary | ICD-10-CM | POA: Diagnosis not present

## 2019-10-21 DIAGNOSIS — I5032 Chronic diastolic (congestive) heart failure: Secondary | ICD-10-CM | POA: Diagnosis not present

## 2019-10-21 DIAGNOSIS — I4811 Longstanding persistent atrial fibrillation: Secondary | ICD-10-CM | POA: Diagnosis not present

## 2019-10-21 NOTE — Telephone Encounter (Signed)
Spoke with patient.  Clarified instructions for warfarin dosing and repeat INR with [facility].  Patient also asked about daily need for additional diltiazem.  HR has continues to have spikes into the 130's, has been on dilt 180 mg plus 60 mg prn - has used the prn more days than not in the past week.  Advised that she continue with this for now, has virtual MD appointment on Friday.  Be sure to share with MD.    Also asked about midodrine.  Previously used 2-3 times daily to keep BP high enough to avoid symptomatic hypotension.  Now BP mostly 130-150 range.  Advised to hold midodrine and use 1/2 tablet when symptomatic.    Patient voiced understanding.

## 2019-10-23 ENCOUNTER — Telehealth: Payer: Self-pay | Admitting: Neurology

## 2019-10-23 DIAGNOSIS — G4731 Primary central sleep apnea: Secondary | ICD-10-CM

## 2019-10-23 DIAGNOSIS — I5033 Acute on chronic diastolic (congestive) heart failure: Secondary | ICD-10-CM | POA: Diagnosis not present

## 2019-10-23 DIAGNOSIS — A419 Sepsis, unspecified organism: Secondary | ICD-10-CM | POA: Diagnosis not present

## 2019-10-23 DIAGNOSIS — E039 Hypothyroidism, unspecified: Secondary | ICD-10-CM | POA: Diagnosis not present

## 2019-10-23 DIAGNOSIS — G4733 Obstructive sleep apnea (adult) (pediatric): Secondary | ICD-10-CM

## 2019-10-23 DIAGNOSIS — G35 Multiple sclerosis: Secondary | ICD-10-CM | POA: Diagnosis not present

## 2019-10-23 DIAGNOSIS — I480 Paroxysmal atrial fibrillation: Secondary | ICD-10-CM | POA: Diagnosis not present

## 2019-10-23 DIAGNOSIS — Z8616 Personal history of COVID-19: Secondary | ICD-10-CM | POA: Diagnosis not present

## 2019-10-23 NOTE — Telephone Encounter (Signed)
Noted and patient has had a lot of health changes and the apt for 4/12 is fine. Will leave as is.

## 2019-10-23 NOTE — Telephone Encounter (Signed)
FYI Pt has called and was told that her next f/u re: what she see's Dr Brett Fairy for needs to be with NP.  Pt has stated that re: the concerns she see's Dr Brett Fairy for she would strongly prefer to see Dr and not NP.  Pt was advised it is office policy that we honor office notes from provider.  Pt was told that Dr Brett Fairy  Put request that pt next f/u with NP.  Pt went on to explain a lot has taken place since she was last seen.  She has been in hospital, she has had pnemonia and has had heart issues and sleep issues as well.  Pt is now scheduled with Dr.Dohmeier for 04-12.  This is FYI, no call back requested

## 2019-10-24 ENCOUNTER — Encounter: Payer: Self-pay | Admitting: Internal Medicine

## 2019-10-24 ENCOUNTER — Telehealth: Payer: Self-pay | Admitting: Neurology

## 2019-10-24 ENCOUNTER — Telehealth (INDEPENDENT_AMBULATORY_CARE_PROVIDER_SITE_OTHER): Payer: Medicare Other | Admitting: Internal Medicine

## 2019-10-24 VITALS — BP 124/66 | HR 77 | Temp 97.6°F | Ht 62.0 in | Wt 178.0 lb

## 2019-10-24 DIAGNOSIS — I951 Orthostatic hypotension: Secondary | ICD-10-CM

## 2019-10-24 DIAGNOSIS — G35 Multiple sclerosis: Secondary | ICD-10-CM | POA: Diagnosis not present

## 2019-10-24 DIAGNOSIS — I110 Hypertensive heart disease with heart failure: Secondary | ICD-10-CM | POA: Diagnosis not present

## 2019-10-24 DIAGNOSIS — I5032 Chronic diastolic (congestive) heart failure: Secondary | ICD-10-CM | POA: Diagnosis not present

## 2019-10-24 DIAGNOSIS — I4811 Longstanding persistent atrial fibrillation: Secondary | ICD-10-CM | POA: Diagnosis not present

## 2019-10-24 DIAGNOSIS — Z7901 Long term (current) use of anticoagulants: Secondary | ICD-10-CM

## 2019-10-24 DIAGNOSIS — M62838 Other muscle spasm: Secondary | ICD-10-CM | POA: Diagnosis not present

## 2019-10-24 DIAGNOSIS — M170 Bilateral primary osteoarthritis of knee: Secondary | ICD-10-CM | POA: Diagnosis not present

## 2019-10-24 DIAGNOSIS — I4821 Permanent atrial fibrillation: Secondary | ICD-10-CM

## 2019-10-24 NOTE — Progress Notes (Signed)
Virtual Visit via Telephone Note   This visit type was conducted due to national recommendations for restrictions regarding the COVID-19 Pandemic (e.g. social distancing) in an effort to limit this patient's exposure and mitigate transmission in our community.  Due to her co-morbid illnesses, this patient is at least at moderate risk for complications without adequate follow up.  This format is felt to be most appropriate for this patient at this time.  The patient did not have access to video technology/had technical difficulties with video requiring transitioning to audio format only (telephone).  All issues noted in this document were discussed and addressed.  No physical exam could be performed with this format.  Please refer to the patient's chart for her  consent to telehealth for [facility].   Evaluation Performed:  Telephone visit  Date:  10/24/2019   ID:  Toni [name], DOB November 30, 1945, MRN [id number]  Patient Location:  [place] [address]  Provider location:   [address] [place], [place] [address]  PCP:  Prince Solian, MD  Cardiologist:  Pixie Casino, MD Electrophysiologist:  None   Chief Complaint:  Follow-up  History of Present Illness:    Toni [name] is a 74 y.o. female who presents via audio/video conferencing for a telehealth visit today.  Toni [name] is a 74 year old female with a history of longstanding persistent and not permanent A. fib and multiple sclerosis.  Recently she was hospitalized has had issues with orthostatic hypotension.  Medicines were discontinued and she was started on midodrine.  She was tested recently for adrenal insufficiency with a cosyntropin stim test which was negative.  She reports some mild improvement in blood pressures.  Her diuretics were held and she was placed on salt load and this resulted in a diastolic heart failure exacerbation.  Yesterday she was instructed to stop her salt and restarted on  diuretics and is now diuresing.  10/24/2019  Toni [name] is seen today via telephone follow-up.  She was recently hospitalized at [facility] for possible pneumonia versus heart failure and treated for presumed sepsis.  As noted above she had COVID-19 back in December and has struggled with orthostatic hypotension requiring midodrine.  She had a protracted hospital course and was seen in consultation by Dr. Marlou Porch to agreed with the use of midodrine.  In follow-up she is required numerous adjustments on her anticoagulation as well as blood pressure medications.  Primarily, her midodrine has been weaned down due to some hypertension although the other day she was hypotensive and did have to take additional medication.  The patient does not have symptoms concerning for COVID-19 infection (fever, chills, cough, or new SHORTNESS OF BREATH).    Prior CV studies:   The following studies were reviewed today:  Chart reviewed  PMHx:  Past Medical History:  Diagnosis Date  . Abnormality of gait 03/01/2015  . Anxiety   . Arthritis    "knees" (01/19/2016)  . Atrial fibrillation with RVR ([place]) 01/19/2016  . Back pain   . Chest pain   . Childhood asthma   . Chronic pain    "nerve pain from the MS"  . Constipation   . Depression   . DVT (deep venous thrombosis) ([place]) 1983   "RLE; may have just been phlebitis; it was before the age of dopplers"  . 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 GERD (gastroesophageal  reflux disease)   . History of stress test 06/21/10   limited exam with some degree of breast attenuatin of the apex however a component of apical ischemia is not excluded, EF 62%; cardiac cath   . HLD (hyperlipidemia)   . Hx of echocardiogram 04/22/10   EF 55-60%, no valve issues  . Hypertension   . Hypothyroidism   . Joint pain   . Migraine    "none since I stopped beta blocker in 1990s" (01/19/2016)  . Multiple sclerosis ([place])    has had  this may 1989-[place] reg doc  . OSA on CPAP   . Osteoarthritis   . PAF (paroxysmal atrial fibrillation) ([place])    on coumadin; documented on monitor 06/2010  . Pneumonia 11/2011  . Shortness of breath   . Thyroid disease     Past Surgical History:  Procedure Laterality Date  . ANTERIOR CERVICAL DECOMP/DISCECTOMY FUSION  01/2004  . BACK SURGERY    . CARDIAC CATHETERIZATION  06/22/2010   normal coronary arteries, PAF  . CATARACT EXTRACTION, BILATERAL Bilateral   . CHILD BIRTHS     X2  . DILATION AND CURETTAGE OF UTERUS    . INFUSION PUMP IMPLANTATION  ~ 2009   baclofen infusion in lower abd  . PAIN PUMP REVISION N/A 07/29/2014   Procedure: Baclofen pump replacement;  Surgeon: Erline Levine, MD;  Location: [facility];  Service: Neurosurgery;  Laterality: N/A;  Baclofen pump replacement  . TONSILLECTOMY AND ADENOIDECTOMY    . VARICOSE VEIN SURGERY  ~ 1968  . VENOUS ABLATION  12/16/2010   radiofreq ablation -Dr Elisabeth Cara and [facility]  . WISDOM TOOTH EXTRACTION      FAMHx:  Family History  Problem Relation Age of Onset  . Coronary artery disease Father        at age 35  . Hyperlipidemia Father   . Thyroid disease Father   . Coronary artery disease Maternal Grandmother   . Depression Maternal Grandmother   . Cancer Paternal Grandmother   . Depression Mother   . Sudden death Mother   . Anxiety disorder Mother   . Alcoholism Son     SOCHx:   reports that she quit smoking about 43 years ago. Her smoking use included cigarettes. She has a 3.50 pack-year smoking history. She has never used smokeless tobacco. She reports current alcohol use of about 1.0 standard drinks of alcohol per week. She reports that she does not use drugs.  ALLERGIES:  Allergies  Allergen Reactions  . Chlorhexidine Hives  . Hydrocodone Nausea And Vomiting    Hydrocodone causes vomiting  . Oxycodone Nausea And Vomiting    Oral oxycodone causes vomiting  . Chlorhexidine Gluconate Rash    Sores at site  .  Zoloft [Sertraline] Hives, Swelling and Rash    MEDS:  Current Meds  Medication Sig  . acetaminophen (TYLENOL) 500 MG tablet Take 500-1,000 mg by mouth every 6 (six) hours as needed for fever or headache (or pain).  Marland Kitchen ALPRAZolam (XANAX) 0.5 MG tablet Take 1 tablet (0.5 mg total) by mouth at bedtime as needed for anxiety.  . Ascorbic Acid (VITAMIN C) 500 MG CAPS Take 500 mg by mouth daily.  . baclofen (LIORESAL) 20 MG tablet Once to two tablets a day for breakthrough spasms (Patient taking differently: Take 20 mg by mouth 2 (two) times daily as needed for muscle spasms. )  . buPROPion (WELLBUTRIN XL) 300 MG 24 hr tablet Take 1 tablet (300 mg total) by mouth  daily.  . Calcium Carbonate-Vit D-Min (CALTRATE 600+D PLUS MINERALS) 600-800 MG-UNIT TABS Take 2 tablets by mouth 2 (two) times daily.  . cetirizine (ZYRTEC) 10 MG tablet Take 10 mg by mouth daily as needed for allergies.   . Cholecalciferol (VITAMIN D) 2000 units tablet Take 1 tablet (2,000 Units total) by mouth 2 (two) times daily.  . Coenzyme Q10 (CO Q-10 PO) Take 1 tablet by mouth daily.  . corticotropin (ACTHAR) 80 UNIT/ML injectable gel INJECT 80 UNITS (1ML) DAILY FOR 10 DAYS. DUE TO MS EXACERBATION AND THEN CAN RESUME NEXT MONTH AT PREVIOUS DOSE. (Patient taking differently: Inject 80 Units into the muscle every other day. For 10 days each month.)  . Cranberry 1000 MG CAPS Take 2 capsules by mouth 2 (two) times daily.   Marland Kitchen diltiazem (CARDIZEM CD) 180 MG 24 hr capsule Take 1 capsule (180 mg total) by mouth daily.  Marland Kitchen diltiazem (CARDIZEM) 60 MG tablet Take 1 tablet (60 mg total) by mouth every 6 (six) hours as needed for up to 7 days (HR sustained >110bpm).  . docusate sodium (COLACE) 100 MG capsule Take 1 capsule (100 mg total) by mouth daily.  . famotidine (PEPCID) 20 MG tablet Take 20 mg by mouth at bedtime.   . fluticasone (FLONASE) 50 MCG/ACT nasal spray Place 2 sprays into both nostrils daily as needed for allergies.   . furosemide  (LASIX) 20 MG tablet Take 1 tablet by mouth daily, may take second dose if needed for excess edema  . gabapentin (NEURONTIN) 300 MG capsule TAKE 1 CAPSULE BY MOUTH UP  TO 4 TIMES DAILY (Patient taking differently: Take 300 mg by mouth 4 (four) times daily. )  . GENERLAC 10 GM/15ML SOLN TAKE 15 ML BY MOUTH TWICE DAILY AS NEEDED (Patient taking differently: Take 10 g by mouth 2 (two) times daily as needed (constipation). )  . levothyroxine (SYNTHROID, LEVOTHROID) 75 MCG tablet Take 75 mcg by mouth daily.  . Melatonin 3 MG TABS Take 1 tablet (3 mg total) by mouth at bedtime as needed (sleep).  . midodrine (PROAMATINE) 5 MG tablet Take 1 tablet (5 mg total) by mouth 3 (three) times daily with meals.  . Omega-3 Fatty Acids (FISH OIL PO) Take 1 capsule by mouth daily.  Marland Kitchen omeprazole (PRILOSEC) 20 MG capsule Take 20 mg by mouth daily.   . ondansetron (ZOFRAN) 4 MG tablet Take 4 mg by mouth every 8 (eight) hours as needed for nausea or vomiting.  . psyllium (METAMUCIL SMOOTH TEXTURE) 28 % packet Take 1 packet by mouth daily.   . simvastatin (ZOCOR) 10 MG tablet Take 10 mg by mouth every evening.   Marland Kitchen SYRINGE/NEEDLE, DISP, 1 ML (BD LUER-LOK SYRINGE) 25G X 5/8" 1 ML MISC Use as directed to inject  Acthar  . Tamsulosin HCl (FLOMAX) 0.4 MG CAPS Take 0.4 mg by mouth daily as needed (bladder emptying).   . warfarin (COUMADIN) 5 MG tablet Take 1/2 to 1 tablet by mouth daily as directed by coumadin clinic.     ROS: Pertinent items noted in HPI and remainder of comprehensive ROS otherwise negative.  Labs/Other Tests and Data Reviewed:    Recent Labs: 10/09/2019: B Natriuretic Peptide 381.6 10/10/2019: TSH 1.374 10/15/2019: Magnesium 2.0 10/16/2019: ALT 17 10/17/2019: BUN 23; Creatinine, Ser 0.35; Hemoglobin 12.2; Platelets 201; Potassium 3.7; Sodium 139   Recent Lipid Panel Lab Results  Component Value Date/Time   CHOL 111 03/19/2019 03:17 PM   TRIG 53 03/19/2019 03:17 PM   HDL  57 03/19/2019 03:17 PM    CHOLHDL 3.0 04/22/2010 05:36 AM   LDLCALC 43 03/19/2019 03:17 PM    Wt Readings from Last 3 Encounters:  10/24/19 178 lb (80.7 kg)  10/17/19 184 lb 11.2 oz (83.8 kg)  09/01/19 180 lb (81.6 kg)     Exam:    Vital Signs:  BP 124/66   Pulse 77   Temp 97.6 F (36.4 C)   Ht 5\' 2"  (1.575 m)   Wt 178 lb (80.7 kg)   SpO2 98%   BMI 32.56 kg/m    Exam deferred due to telephone visit  ASSESSMENT & PLAN:    1. Longstanding persistent and not permanent A. fib 2. Orthostatic hypotension 3. Multiple sclerosis 4. Chronic diastolic heart failure  Toni [name] has had a rough several months including COVID-19 infection, hospitalizations, possible pneumonia versus heart failure and sepsis picture, as well as recurrent orthostatic hypotension.  She is responded to midodrine.  I suspect that her MS may be playing a role in this.  She is supposed to follow-up with Dr. Brett Fairy in April.  For now she should continue use the midodrine as needed for systolic blood pressures less than 100.  Her A. fib is stable.  A repeat echo in February looked unchanged from a prior study in August 2020.  Plan follow-up with me in 3 months or sooner as necessary.  COVID-19 Education: The signs and symptoms of COVID-19 were discussed with the patient and how to seek care for testing (follow up with PCP or arrange E-visit).  The importance of social distancing was discussed today.  Patient Risk:   After full review of this patients clinical status, I feel that they are at least moderate risk at this time.  Time:   Today, I have spent 25 minutes with the patient with telehealth technology discussing A. fib, orthostatic hypotension..     Medication Adjustments/Labs and Tests Ordered: Current medicines are reviewed at length with the patient today.  Concerns regarding medicines are outlined above.   Tests Ordered: No orders of the defined types were placed in this encounter.   Medication Changes: No orders of the  defined types were placed in this encounter.   Disposition:  in 3 month(s)  Pixie Casino, MD, [facility], [place] Director of the Advanced Lipid Disorders &  Cardiovascular Risk Reduction Clinic Diplomate of the American Board of Clinical Lipidology Attending Cardiologist  Direct Dial: [phone]  Fax: [phone]  Website:  [email or web address]  Pixie Casino, MD  10/24/2019 9:46 AM

## 2019-10-24 NOTE — Patient Instructions (Signed)
Medication Instructions:  Your physician recommends that you continue on your current medications as directed. Please refer to the Current Medication list given to you today.  *If you need a refill on your cardiac medications before your next appointment, please call your pharmacy*   Follow-Up: At [facility], you and your health needs are our priority.  As part of our continuing mission to provide you with exceptional heart care, we have created designated Provider Care Teams.  These Care Teams include your primary Cardiologist (physician) and Advanced Practice Providers (APPs -  Physician Assistants and Nurse Practitioners) who all work together to provide you with the care you need, when you need it.  We recommend signing up for the patient portal called "MyChart".  Sign up information is provided on this After Visit Summary.  MyChart is used to connect with patients for Virtual Visits (Telemedicine).  Patients are able to view lab/test results, encounter notes, upcoming appointments, etc.  Non-urgent messages can be sent to your provider as well.   To learn more about what you can do with MyChart, go to [email or web address].    Your next appointment:   3 month(s)  The format for your next appointment:   In Person  Provider:   You may see Kenneth C Hilty, MD or one of the following Advanced Practice Providers on your designated Care Team:    Hao Meng, PA-C  Angela Duke, PA-C or   Krista Kroeger, PA-C    Other Instructions   

## 2019-10-24 NOTE — Telephone Encounter (Signed)
Yes, I think she is getting better. She is looking forward to seeing you. Hope all is well.  -Mali

## 2019-10-24 NOTE — Telephone Encounter (Signed)
Dear Dr. Debara Pickett,  Thank you for sending your virtual visit note with our mutual patient to me. I was unaware that her COVID 19 infection in December had let to multiple complications, also she is high risk for long-haul Covid recovery. The hypotension and autonomic problems are getting better as I extracted form your information.  Thank you again,   [name], MD

## 2019-10-26 NOTE — Telephone Encounter (Signed)
Yes, going well here. Hope the same for you and yours.  PS:  Toni [name] will be able to get the corona-vaccine 90 days after last testing positive, correct?  Asencion Partridge

## 2019-10-27 ENCOUNTER — Ambulatory Visit (INDEPENDENT_AMBULATORY_CARE_PROVIDER_SITE_OTHER): Payer: Medicare Other | Admitting: Cardiology

## 2019-10-27 DIAGNOSIS — Z7901 Long term (current) use of anticoagulants: Secondary | ICD-10-CM

## 2019-10-27 DIAGNOSIS — I5032 Chronic diastolic (congestive) heart failure: Secondary | ICD-10-CM | POA: Diagnosis not present

## 2019-10-27 DIAGNOSIS — G35 Multiple sclerosis: Secondary | ICD-10-CM | POA: Diagnosis not present

## 2019-10-27 DIAGNOSIS — M62838 Other muscle spasm: Secondary | ICD-10-CM | POA: Diagnosis not present

## 2019-10-27 DIAGNOSIS — I4811 Longstanding persistent atrial fibrillation: Secondary | ICD-10-CM | POA: Diagnosis not present

## 2019-10-27 DIAGNOSIS — M170 Bilateral primary osteoarthritis of knee: Secondary | ICD-10-CM | POA: Diagnosis not present

## 2019-10-27 DIAGNOSIS — I110 Hypertensive heart disease with heart failure: Secondary | ICD-10-CM | POA: Diagnosis not present

## 2019-10-27 LAB — POCT INR: INR: 1.8 — AB (ref 2.0–3.0)

## 2019-10-27 NOTE — Telephone Encounter (Signed)
I believe it is 45 days, unless you had pooled plasma treatment or Ab infusion (which she did not).  -Mali

## 2019-10-28 DIAGNOSIS — M170 Bilateral primary osteoarthritis of knee: Secondary | ICD-10-CM | POA: Diagnosis not present

## 2019-10-28 DIAGNOSIS — G35 Multiple sclerosis: Secondary | ICD-10-CM | POA: Diagnosis not present

## 2019-10-28 DIAGNOSIS — M62838 Other muscle spasm: Secondary | ICD-10-CM | POA: Diagnosis not present

## 2019-10-28 DIAGNOSIS — I5032 Chronic diastolic (congestive) heart failure: Secondary | ICD-10-CM | POA: Diagnosis not present

## 2019-10-28 DIAGNOSIS — I4811 Longstanding persistent atrial fibrillation: Secondary | ICD-10-CM | POA: Diagnosis not present

## 2019-10-28 DIAGNOSIS — I110 Hypertensive heart disease with heart failure: Secondary | ICD-10-CM | POA: Diagnosis not present

## 2019-10-30 DIAGNOSIS — I4811 Longstanding persistent atrial fibrillation: Secondary | ICD-10-CM | POA: Diagnosis not present

## 2019-10-30 DIAGNOSIS — M62838 Other muscle spasm: Secondary | ICD-10-CM | POA: Diagnosis not present

## 2019-10-30 DIAGNOSIS — N390 Urinary tract infection, site not specified: Secondary | ICD-10-CM | POA: Diagnosis not present

## 2019-10-30 DIAGNOSIS — G35 Multiple sclerosis: Secondary | ICD-10-CM | POA: Diagnosis not present

## 2019-10-30 DIAGNOSIS — I110 Hypertensive heart disease with heart failure: Secondary | ICD-10-CM | POA: Diagnosis not present

## 2019-10-30 DIAGNOSIS — I5032 Chronic diastolic (congestive) heart failure: Secondary | ICD-10-CM | POA: Diagnosis not present

## 2019-10-30 DIAGNOSIS — M170 Bilateral primary osteoarthritis of knee: Secondary | ICD-10-CM | POA: Diagnosis not present

## 2019-10-30 DIAGNOSIS — E7849 Other hyperlipidemia: Secondary | ICD-10-CM | POA: Diagnosis not present

## 2019-10-31 DIAGNOSIS — M170 Bilateral primary osteoarthritis of knee: Secondary | ICD-10-CM | POA: Diagnosis not present

## 2019-10-31 DIAGNOSIS — I4811 Longstanding persistent atrial fibrillation: Secondary | ICD-10-CM | POA: Diagnosis not present

## 2019-10-31 DIAGNOSIS — G35 Multiple sclerosis: Secondary | ICD-10-CM | POA: Diagnosis not present

## 2019-10-31 DIAGNOSIS — I5032 Chronic diastolic (congestive) heart failure: Secondary | ICD-10-CM | POA: Diagnosis not present

## 2019-10-31 DIAGNOSIS — I110 Hypertensive heart disease with heart failure: Secondary | ICD-10-CM | POA: Diagnosis not present

## 2019-10-31 DIAGNOSIS — M62838 Other muscle spasm: Secondary | ICD-10-CM | POA: Diagnosis not present

## 2019-11-03 ENCOUNTER — Ambulatory Visit (INDEPENDENT_AMBULATORY_CARE_PROVIDER_SITE_OTHER): Payer: Medicare Other | Admitting: Cardiovascular Disease

## 2019-11-03 DIAGNOSIS — M170 Bilateral primary osteoarthritis of knee: Secondary | ICD-10-CM | POA: Diagnosis not present

## 2019-11-03 DIAGNOSIS — I4811 Longstanding persistent atrial fibrillation: Secondary | ICD-10-CM | POA: Diagnosis not present

## 2019-11-03 DIAGNOSIS — Z7901 Long term (current) use of anticoagulants: Secondary | ICD-10-CM | POA: Diagnosis not present

## 2019-11-03 DIAGNOSIS — M62838 Other muscle spasm: Secondary | ICD-10-CM | POA: Diagnosis not present

## 2019-11-03 DIAGNOSIS — I110 Hypertensive heart disease with heart failure: Secondary | ICD-10-CM | POA: Diagnosis not present

## 2019-11-03 DIAGNOSIS — G35 Multiple sclerosis: Secondary | ICD-10-CM | POA: Diagnosis not present

## 2019-11-03 DIAGNOSIS — I5032 Chronic diastolic (congestive) heart failure: Secondary | ICD-10-CM | POA: Diagnosis not present

## 2019-11-03 LAB — POCT INR: INR: 2.1 (ref 2.0–3.0)

## 2019-11-04 DIAGNOSIS — G35 Multiple sclerosis: Secondary | ICD-10-CM | POA: Diagnosis not present

## 2019-11-04 DIAGNOSIS — M62838 Other muscle spasm: Secondary | ICD-10-CM | POA: Diagnosis not present

## 2019-11-04 DIAGNOSIS — I4811 Longstanding persistent atrial fibrillation: Secondary | ICD-10-CM | POA: Diagnosis not present

## 2019-11-04 DIAGNOSIS — I5032 Chronic diastolic (congestive) heart failure: Secondary | ICD-10-CM | POA: Diagnosis not present

## 2019-11-04 DIAGNOSIS — M170 Bilateral primary osteoarthritis of knee: Secondary | ICD-10-CM | POA: Diagnosis not present

## 2019-11-04 DIAGNOSIS — I110 Hypertensive heart disease with heart failure: Secondary | ICD-10-CM | POA: Diagnosis not present

## 2019-11-05 DIAGNOSIS — I5032 Chronic diastolic (congestive) heart failure: Secondary | ICD-10-CM | POA: Diagnosis not present

## 2019-11-05 DIAGNOSIS — G35 Multiple sclerosis: Secondary | ICD-10-CM | POA: Diagnosis not present

## 2019-11-05 DIAGNOSIS — I110 Hypertensive heart disease with heart failure: Secondary | ICD-10-CM | POA: Diagnosis not present

## 2019-11-05 DIAGNOSIS — M170 Bilateral primary osteoarthritis of knee: Secondary | ICD-10-CM | POA: Diagnosis not present

## 2019-11-05 DIAGNOSIS — I4811 Longstanding persistent atrial fibrillation: Secondary | ICD-10-CM | POA: Diagnosis not present

## 2019-11-05 DIAGNOSIS — M62838 Other muscle spasm: Secondary | ICD-10-CM | POA: Diagnosis not present

## 2019-11-06 ENCOUNTER — Telehealth: Payer: Self-pay | Admitting: Pharmacist Clinician (PhC)/ Clinical Pharmacy Specialist

## 2019-11-06 DIAGNOSIS — M170 Bilateral primary osteoarthritis of knee: Secondary | ICD-10-CM | POA: Diagnosis not present

## 2019-11-06 DIAGNOSIS — M62838 Other muscle spasm: Secondary | ICD-10-CM | POA: Diagnosis not present

## 2019-11-06 DIAGNOSIS — I110 Hypertensive heart disease with heart failure: Secondary | ICD-10-CM | POA: Diagnosis not present

## 2019-11-06 DIAGNOSIS — I4811 Longstanding persistent atrial fibrillation: Secondary | ICD-10-CM | POA: Diagnosis not present

## 2019-11-06 DIAGNOSIS — I5032 Chronic diastolic (congestive) heart failure: Secondary | ICD-10-CM | POA: Diagnosis not present

## 2019-11-06 DIAGNOSIS — G35 Multiple sclerosis: Secondary | ICD-10-CM | POA: Diagnosis not present

## 2019-11-06 NOTE — Telephone Encounter (Signed)
Patient called to report that she is taking SMX-TMP DS bid x 7 days.  Started on Monday afternoon.  Due to potential interaction with warfarin, I have asked that she hold her warfarin dose today until we can get [facility] out to do INR check.  Also called [facility] and [facility] asking if possible for INR draw today or tomorrow.  Patient aware not to take warfarin until we can determine if INR can be checked

## 2019-11-06 NOTE — Telephone Encounter (Signed)
LMOM for RN Sharyn Lull.  INR due tomorrow 3/19 d/t DDI between warfarin and bactrim

## 2019-11-07 ENCOUNTER — Ambulatory Visit (INDEPENDENT_AMBULATORY_CARE_PROVIDER_SITE_OTHER): Payer: Medicare Other | Admitting: Pharmacist Clinician (PhC)/ Clinical Pharmacy Specialist

## 2019-11-07 DIAGNOSIS — I951 Orthostatic hypotension: Secondary | ICD-10-CM | POA: Diagnosis not present

## 2019-11-07 DIAGNOSIS — F419 Anxiety disorder, unspecified: Secondary | ICD-10-CM | POA: Diagnosis not present

## 2019-11-07 DIAGNOSIS — K219 Gastro-esophageal reflux disease without esophagitis: Secondary | ICD-10-CM | POA: Diagnosis not present

## 2019-11-07 DIAGNOSIS — K5900 Constipation, unspecified: Secondary | ICD-10-CM | POA: Diagnosis not present

## 2019-11-07 DIAGNOSIS — G43909 Migraine, unspecified, not intractable, without status migrainosus: Secondary | ICD-10-CM | POA: Diagnosis not present

## 2019-11-07 DIAGNOSIS — E039 Hypothyroidism, unspecified: Secondary | ICD-10-CM | POA: Diagnosis not present

## 2019-11-07 DIAGNOSIS — Z86718 Personal history of other venous thrombosis and embolism: Secondary | ICD-10-CM | POA: Diagnosis not present

## 2019-11-07 DIAGNOSIS — I110 Hypertensive heart disease with heart failure: Secondary | ICD-10-CM | POA: Diagnosis not present

## 2019-11-07 DIAGNOSIS — Z8701 Personal history of pneumonia (recurrent): Secondary | ICD-10-CM | POA: Diagnosis not present

## 2019-11-07 DIAGNOSIS — I4891 Unspecified atrial fibrillation: Secondary | ICD-10-CM

## 2019-11-07 DIAGNOSIS — M549 Dorsalgia, unspecified: Secondary | ICD-10-CM | POA: Diagnosis not present

## 2019-11-07 DIAGNOSIS — Z5181 Encounter for therapeutic drug level monitoring: Secondary | ICD-10-CM | POA: Diagnosis not present

## 2019-11-07 DIAGNOSIS — I5032 Chronic diastolic (congestive) heart failure: Secondary | ICD-10-CM | POA: Diagnosis not present

## 2019-11-07 DIAGNOSIS — F329 Major depressive disorder, single episode, unspecified: Secondary | ICD-10-CM | POA: Diagnosis not present

## 2019-11-07 DIAGNOSIS — M62838 Other muscle spasm: Secondary | ICD-10-CM | POA: Diagnosis not present

## 2019-11-07 DIAGNOSIS — M170 Bilateral primary osteoarthritis of knee: Secondary | ICD-10-CM | POA: Diagnosis not present

## 2019-11-07 DIAGNOSIS — Z79899 Other long term (current) drug therapy: Secondary | ICD-10-CM | POA: Diagnosis not present

## 2019-11-07 DIAGNOSIS — J45909 Unspecified asthma, uncomplicated: Secondary | ICD-10-CM | POA: Diagnosis not present

## 2019-11-07 DIAGNOSIS — Z7901 Long term (current) use of anticoagulants: Secondary | ICD-10-CM

## 2019-11-07 DIAGNOSIS — G4733 Obstructive sleep apnea (adult) (pediatric): Secondary | ICD-10-CM | POA: Diagnosis not present

## 2019-11-07 DIAGNOSIS — G35 Multiple sclerosis: Secondary | ICD-10-CM | POA: Diagnosis not present

## 2019-11-07 DIAGNOSIS — I4811 Longstanding persistent atrial fibrillation: Secondary | ICD-10-CM | POA: Diagnosis not present

## 2019-11-07 DIAGNOSIS — G8929 Other chronic pain: Secondary | ICD-10-CM | POA: Diagnosis not present

## 2019-11-07 DIAGNOSIS — I8393 Asymptomatic varicose veins of bilateral lower extremities: Secondary | ICD-10-CM | POA: Diagnosis not present

## 2019-11-07 DIAGNOSIS — Z8616 Personal history of COVID-19: Secondary | ICD-10-CM | POA: Diagnosis not present

## 2019-11-07 DIAGNOSIS — E785 Hyperlipidemia, unspecified: Secondary | ICD-10-CM | POA: Diagnosis not present

## 2019-11-07 LAB — POCT INR: INR: 2.8 (ref 2.0–3.0)

## 2019-11-10 ENCOUNTER — Ambulatory Visit (INDEPENDENT_AMBULATORY_CARE_PROVIDER_SITE_OTHER): Payer: Medicare Other | Admitting: Cardiology

## 2019-11-10 DIAGNOSIS — I4891 Unspecified atrial fibrillation: Secondary | ICD-10-CM

## 2019-11-10 DIAGNOSIS — M170 Bilateral primary osteoarthritis of knee: Secondary | ICD-10-CM | POA: Diagnosis not present

## 2019-11-10 DIAGNOSIS — I4811 Longstanding persistent atrial fibrillation: Secondary | ICD-10-CM | POA: Diagnosis not present

## 2019-11-10 DIAGNOSIS — I5032 Chronic diastolic (congestive) heart failure: Secondary | ICD-10-CM | POA: Diagnosis not present

## 2019-11-10 DIAGNOSIS — Z7901 Long term (current) use of anticoagulants: Secondary | ICD-10-CM | POA: Diagnosis not present

## 2019-11-10 DIAGNOSIS — I110 Hypertensive heart disease with heart failure: Secondary | ICD-10-CM | POA: Diagnosis not present

## 2019-11-10 DIAGNOSIS — G35 Multiple sclerosis: Secondary | ICD-10-CM | POA: Diagnosis not present

## 2019-11-10 DIAGNOSIS — M62838 Other muscle spasm: Secondary | ICD-10-CM | POA: Diagnosis not present

## 2019-11-10 LAB — POCT INR: INR: 2.2 (ref 2.0–3.0)

## 2019-11-11 DIAGNOSIS — M62838 Other muscle spasm: Secondary | ICD-10-CM | POA: Diagnosis not present

## 2019-11-11 DIAGNOSIS — I5032 Chronic diastolic (congestive) heart failure: Secondary | ICD-10-CM | POA: Diagnosis not present

## 2019-11-11 DIAGNOSIS — I110 Hypertensive heart disease with heart failure: Secondary | ICD-10-CM | POA: Diagnosis not present

## 2019-11-11 DIAGNOSIS — M170 Bilateral primary osteoarthritis of knee: Secondary | ICD-10-CM | POA: Diagnosis not present

## 2019-11-11 DIAGNOSIS — I4811 Longstanding persistent atrial fibrillation: Secondary | ICD-10-CM | POA: Diagnosis not present

## 2019-11-11 DIAGNOSIS — G35 Multiple sclerosis: Secondary | ICD-10-CM | POA: Diagnosis not present

## 2019-11-11 NOTE — Telephone Encounter (Signed)
Spoke with Dr Brett Fairy and she reviewed her download and indicated that we should bring her in for bipap titration due to the severity of her apnea on current settings. She is on Bipap 13/9 and obstructive events are occurring 27.5 and central at AHI of 7. Order will be placed for the patient.

## 2019-11-11 NOTE — Addendum Note (Signed)
Addended by: Darleen Crocker on: 11/11/2019 03:58 PM   Modules accepted: Orders

## 2019-11-11 NOTE — Telephone Encounter (Signed)
Called the patient back. She is home now. She is nervous and concerned because when she was in the rehab center there were times where she would be woken up to several team members over her and they were telling her that she was difficult to wake up. Normally she is a light sleeper and it doesn't take much to wake her. She states that they never said she stopped breathing and she would be using her machine but she was hard to wake up.  Now that she is home her caregiver attempted to wake her up around 9:30 am and same thing she was difficult to wake up. Her husband came in there. She is concerned this is happening. I advised I would look at her download. I asked her if she had any medication changes that would cause increase in sleepiness and she denies no new medications. She does state this started becoming a concern post Covid. Also pos covid she struggles with nausea still. Advised I would pass this information along and show her the download on her machine. She verbalized understanding and will wait for my return call.

## 2019-11-11 NOTE — Telephone Encounter (Signed)
Called the patient and discussed with her and due to the declining health she is unable to come in for a titration study. She is unable to transfer from her bed to wheelchair or WC to car and it is a process just having her get out. Was able to place the patient on brief hold and ask Dr Brett Fairy and she went ahead and increased the BiPAP pressure to 16/11cm water pressure. Patient also inquired about checking her oxygen when sleeping. Advised the patient Dr Brett Fairy would order a ONO to be completed on the new pressure settings but advised that insurance is really strict about if oxygen is needed then we would have to do a Bipap titration where we add oxygen if needed in the sleep lab. Advised that we might would have better luck with cardiology or pulmonology being able to order oxygen but as far as [name] we are restricted in what we are able to do. She verbalized understanding.

## 2019-11-11 NOTE — Telephone Encounter (Signed)
Pt called to inform that she has had a couple of times where people have had difficulty waking her up and when she wakes up she has a headache. And wanted to inform/discuss with RN prior to upcoming apt

## 2019-11-11 NOTE — Addendum Note (Signed)
Addended by: Darleen Crocker on: 11/11/2019 04:28 PM   Modules accepted: Orders

## 2019-11-12 ENCOUNTER — Encounter (INDEPENDENT_AMBULATORY_CARE_PROVIDER_SITE_OTHER): Payer: Self-pay | Admitting: Family Medicine

## 2019-11-12 ENCOUNTER — Telehealth (INDEPENDENT_AMBULATORY_CARE_PROVIDER_SITE_OTHER): Payer: Medicare Other | Admitting: Family Medicine

## 2019-11-12 ENCOUNTER — Other Ambulatory Visit: Payer: Self-pay

## 2019-11-12 DIAGNOSIS — Z6833 Body mass index (BMI) 33.0-33.9, adult: Secondary | ICD-10-CM

## 2019-11-12 DIAGNOSIS — M62838 Other muscle spasm: Secondary | ICD-10-CM | POA: Diagnosis not present

## 2019-11-12 DIAGNOSIS — I110 Hypertensive heart disease with heart failure: Secondary | ICD-10-CM | POA: Diagnosis not present

## 2019-11-12 DIAGNOSIS — G35 Multiple sclerosis: Secondary | ICD-10-CM | POA: Diagnosis not present

## 2019-11-12 DIAGNOSIS — M170 Bilateral primary osteoarthritis of knee: Secondary | ICD-10-CM | POA: Diagnosis not present

## 2019-11-12 DIAGNOSIS — I5032 Chronic diastolic (congestive) heart failure: Secondary | ICD-10-CM | POA: Diagnosis not present

## 2019-11-12 DIAGNOSIS — I4811 Longstanding persistent atrial fibrillation: Secondary | ICD-10-CM | POA: Diagnosis not present

## 2019-11-12 DIAGNOSIS — E669 Obesity, unspecified: Secondary | ICD-10-CM

## 2019-11-13 DIAGNOSIS — I4811 Longstanding persistent atrial fibrillation: Secondary | ICD-10-CM | POA: Diagnosis not present

## 2019-11-13 DIAGNOSIS — M170 Bilateral primary osteoarthritis of knee: Secondary | ICD-10-CM | POA: Diagnosis not present

## 2019-11-13 DIAGNOSIS — I110 Hypertensive heart disease with heart failure: Secondary | ICD-10-CM | POA: Diagnosis not present

## 2019-11-13 DIAGNOSIS — G35 Multiple sclerosis: Secondary | ICD-10-CM | POA: Diagnosis not present

## 2019-11-13 DIAGNOSIS — I5032 Chronic diastolic (congestive) heart failure: Secondary | ICD-10-CM | POA: Diagnosis not present

## 2019-11-13 DIAGNOSIS — M62838 Other muscle spasm: Secondary | ICD-10-CM | POA: Diagnosis not present

## 2019-11-14 DIAGNOSIS — I4811 Longstanding persistent atrial fibrillation: Secondary | ICD-10-CM | POA: Diagnosis not present

## 2019-11-14 DIAGNOSIS — G35 Multiple sclerosis: Secondary | ICD-10-CM | POA: Diagnosis not present

## 2019-11-14 DIAGNOSIS — M170 Bilateral primary osteoarthritis of knee: Secondary | ICD-10-CM | POA: Diagnosis not present

## 2019-11-14 DIAGNOSIS — I5032 Chronic diastolic (congestive) heart failure: Secondary | ICD-10-CM | POA: Diagnosis not present

## 2019-11-14 DIAGNOSIS — M62838 Other muscle spasm: Secondary | ICD-10-CM | POA: Diagnosis not present

## 2019-11-14 DIAGNOSIS — I110 Hypertensive heart disease with heart failure: Secondary | ICD-10-CM | POA: Diagnosis not present

## 2019-11-17 ENCOUNTER — Ambulatory Visit (INDEPENDENT_AMBULATORY_CARE_PROVIDER_SITE_OTHER): Payer: Medicare Other | Admitting: Internal Medicine

## 2019-11-17 ENCOUNTER — Telehealth: Payer: Medicare Other | Admitting: Family Medicine

## 2019-11-17 DIAGNOSIS — I5032 Chronic diastolic (congestive) heart failure: Secondary | ICD-10-CM | POA: Diagnosis not present

## 2019-11-17 DIAGNOSIS — Z7901 Long term (current) use of anticoagulants: Secondary | ICD-10-CM

## 2019-11-17 DIAGNOSIS — I4811 Longstanding persistent atrial fibrillation: Secondary | ICD-10-CM | POA: Diagnosis not present

## 2019-11-17 DIAGNOSIS — M170 Bilateral primary osteoarthritis of knee: Secondary | ICD-10-CM | POA: Diagnosis not present

## 2019-11-17 DIAGNOSIS — M62838 Other muscle spasm: Secondary | ICD-10-CM | POA: Diagnosis not present

## 2019-11-17 DIAGNOSIS — G35 Multiple sclerosis: Secondary | ICD-10-CM | POA: Diagnosis not present

## 2019-11-17 DIAGNOSIS — I110 Hypertensive heart disease with heart failure: Secondary | ICD-10-CM | POA: Diagnosis not present

## 2019-11-17 LAB — POCT INR: INR: 1.6 — AB (ref 2.0–3.0)

## 2019-11-18 DIAGNOSIS — I110 Hypertensive heart disease with heart failure: Secondary | ICD-10-CM | POA: Diagnosis not present

## 2019-11-18 DIAGNOSIS — I4811 Longstanding persistent atrial fibrillation: Secondary | ICD-10-CM | POA: Diagnosis not present

## 2019-11-18 DIAGNOSIS — I5032 Chronic diastolic (congestive) heart failure: Secondary | ICD-10-CM | POA: Diagnosis not present

## 2019-11-18 DIAGNOSIS — G35 Multiple sclerosis: Secondary | ICD-10-CM | POA: Diagnosis not present

## 2019-11-18 DIAGNOSIS — M170 Bilateral primary osteoarthritis of knee: Secondary | ICD-10-CM | POA: Diagnosis not present

## 2019-11-18 DIAGNOSIS — M62838 Other muscle spasm: Secondary | ICD-10-CM | POA: Diagnosis not present

## 2019-11-19 DIAGNOSIS — M170 Bilateral primary osteoarthritis of knee: Secondary | ICD-10-CM | POA: Diagnosis not present

## 2019-11-19 DIAGNOSIS — I110 Hypertensive heart disease with heart failure: Secondary | ICD-10-CM | POA: Diagnosis not present

## 2019-11-19 DIAGNOSIS — I4811 Longstanding persistent atrial fibrillation: Secondary | ICD-10-CM | POA: Diagnosis not present

## 2019-11-19 DIAGNOSIS — G35 Multiple sclerosis: Secondary | ICD-10-CM | POA: Diagnosis not present

## 2019-11-19 DIAGNOSIS — M62838 Other muscle spasm: Secondary | ICD-10-CM | POA: Diagnosis not present

## 2019-11-19 DIAGNOSIS — I5032 Chronic diastolic (congestive) heart failure: Secondary | ICD-10-CM | POA: Diagnosis not present

## 2019-11-21 ENCOUNTER — Encounter: Payer: Self-pay | Admitting: Neurology

## 2019-11-21 DIAGNOSIS — M170 Bilateral primary osteoarthritis of knee: Secondary | ICD-10-CM | POA: Diagnosis not present

## 2019-11-21 DIAGNOSIS — G35 Multiple sclerosis: Secondary | ICD-10-CM | POA: Diagnosis not present

## 2019-11-21 DIAGNOSIS — M62838 Other muscle spasm: Secondary | ICD-10-CM | POA: Diagnosis not present

## 2019-11-21 DIAGNOSIS — I5032 Chronic diastolic (congestive) heart failure: Secondary | ICD-10-CM | POA: Diagnosis not present

## 2019-11-21 DIAGNOSIS — I110 Hypertensive heart disease with heart failure: Secondary | ICD-10-CM | POA: Diagnosis not present

## 2019-11-21 DIAGNOSIS — R0902 Hypoxemia: Secondary | ICD-10-CM | POA: Diagnosis not present

## 2019-11-21 DIAGNOSIS — I4811 Longstanding persistent atrial fibrillation: Secondary | ICD-10-CM | POA: Diagnosis not present

## 2019-11-21 DIAGNOSIS — J449 Chronic obstructive pulmonary disease, unspecified: Secondary | ICD-10-CM | POA: Diagnosis not present

## 2019-11-24 ENCOUNTER — Telehealth: Payer: Self-pay | Admitting: Neurology

## 2019-11-24 ENCOUNTER — Ambulatory Visit (INDEPENDENT_AMBULATORY_CARE_PROVIDER_SITE_OTHER): Payer: Medicare Other | Admitting: Internal Medicine

## 2019-11-24 DIAGNOSIS — M170 Bilateral primary osteoarthritis of knee: Secondary | ICD-10-CM | POA: Diagnosis not present

## 2019-11-24 DIAGNOSIS — M62838 Other muscle spasm: Secondary | ICD-10-CM | POA: Diagnosis not present

## 2019-11-24 DIAGNOSIS — I110 Hypertensive heart disease with heart failure: Secondary | ICD-10-CM | POA: Diagnosis not present

## 2019-11-24 DIAGNOSIS — Z7901 Long term (current) use of anticoagulants: Secondary | ICD-10-CM | POA: Diagnosis not present

## 2019-11-24 DIAGNOSIS — G35 Multiple sclerosis: Secondary | ICD-10-CM | POA: Diagnosis not present

## 2019-11-24 DIAGNOSIS — I5032 Chronic diastolic (congestive) heart failure: Secondary | ICD-10-CM | POA: Diagnosis not present

## 2019-11-24 DIAGNOSIS — I4811 Longstanding persistent atrial fibrillation: Secondary | ICD-10-CM | POA: Diagnosis not present

## 2019-11-24 LAB — POCT INR: INR: 2 (ref 2.0–3.0)

## 2019-11-24 NOTE — Telephone Encounter (Signed)
ONO result has been sent from [facility]. Patient completed the ONO on BiPAP 16/11 cm water pressure. Will send to Dr Brett Fairy to review and result. Patient unfortunately is unable to come into the sleep lab to completed overnight testing due to limited mobility. Pt has upcoming apt on 12/01/19 with Dr Brett Fairy. Will review results after Dr Brett Fairy reviews and results.

## 2019-11-25 DIAGNOSIS — M170 Bilateral primary osteoarthritis of knee: Secondary | ICD-10-CM | POA: Diagnosis not present

## 2019-11-25 DIAGNOSIS — M62838 Other muscle spasm: Secondary | ICD-10-CM | POA: Diagnosis not present

## 2019-11-25 DIAGNOSIS — I110 Hypertensive heart disease with heart failure: Secondary | ICD-10-CM | POA: Diagnosis not present

## 2019-11-25 DIAGNOSIS — G35 Multiple sclerosis: Secondary | ICD-10-CM | POA: Diagnosis not present

## 2019-11-25 DIAGNOSIS — I5032 Chronic diastolic (congestive) heart failure: Secondary | ICD-10-CM | POA: Diagnosis not present

## 2019-11-25 DIAGNOSIS — I4811 Longstanding persistent atrial fibrillation: Secondary | ICD-10-CM | POA: Diagnosis not present

## 2019-11-26 DIAGNOSIS — I5032 Chronic diastolic (congestive) heart failure: Secondary | ICD-10-CM | POA: Diagnosis not present

## 2019-11-26 DIAGNOSIS — I4811 Longstanding persistent atrial fibrillation: Secondary | ICD-10-CM | POA: Diagnosis not present

## 2019-11-26 DIAGNOSIS — M62838 Other muscle spasm: Secondary | ICD-10-CM | POA: Diagnosis not present

## 2019-11-26 DIAGNOSIS — M170 Bilateral primary osteoarthritis of knee: Secondary | ICD-10-CM | POA: Diagnosis not present

## 2019-11-26 DIAGNOSIS — G35 Multiple sclerosis: Secondary | ICD-10-CM | POA: Diagnosis not present

## 2019-11-26 DIAGNOSIS — I110 Hypertensive heart disease with heart failure: Secondary | ICD-10-CM | POA: Diagnosis not present

## 2019-11-27 NOTE — Telephone Encounter (Signed)
The patient has hypoxia while using BiPAP but cannot come to the office for all night titration . Can we ask her cardiologist for help with 02 order? Dr. Lamonte Sakai.

## 2019-11-28 DIAGNOSIS — I4811 Longstanding persistent atrial fibrillation: Secondary | ICD-10-CM | POA: Diagnosis not present

## 2019-11-28 DIAGNOSIS — I5032 Chronic diastolic (congestive) heart failure: Secondary | ICD-10-CM | POA: Diagnosis not present

## 2019-11-28 DIAGNOSIS — M170 Bilateral primary osteoarthritis of knee: Secondary | ICD-10-CM | POA: Diagnosis not present

## 2019-11-28 DIAGNOSIS — M62838 Other muscle spasm: Secondary | ICD-10-CM | POA: Diagnosis not present

## 2019-11-28 DIAGNOSIS — G35 Multiple sclerosis: Secondary | ICD-10-CM | POA: Diagnosis not present

## 2019-11-28 DIAGNOSIS — I110 Hypertensive heart disease with heart failure: Secondary | ICD-10-CM | POA: Diagnosis not present

## 2019-12-01 ENCOUNTER — Encounter: Payer: Self-pay | Admitting: Neurology

## 2019-12-01 ENCOUNTER — Other Ambulatory Visit: Payer: Self-pay

## 2019-12-01 ENCOUNTER — Ambulatory Visit (INDEPENDENT_AMBULATORY_CARE_PROVIDER_SITE_OTHER): Payer: Medicare Other | Admitting: Neurology

## 2019-12-01 ENCOUNTER — Ambulatory Visit (INDEPENDENT_AMBULATORY_CARE_PROVIDER_SITE_OTHER): Payer: Medicare Other | Admitting: Pharmacist

## 2019-12-01 VITALS — BP 122/62 | HR 82 | Temp 97.6°F

## 2019-12-01 DIAGNOSIS — E662 Morbid (severe) obesity with alveolar hypoventilation: Secondary | ICD-10-CM

## 2019-12-01 DIAGNOSIS — E249 Cushing's syndrome, unspecified: Secondary | ICD-10-CM

## 2019-12-01 DIAGNOSIS — R0602 Shortness of breath: Secondary | ICD-10-CM | POA: Diagnosis not present

## 2019-12-01 DIAGNOSIS — I5032 Chronic diastolic (congestive) heart failure: Secondary | ICD-10-CM | POA: Diagnosis not present

## 2019-12-01 DIAGNOSIS — E119 Type 2 diabetes mellitus without complications: Secondary | ICD-10-CM | POA: Diagnosis not present

## 2019-12-01 DIAGNOSIS — M170 Bilateral primary osteoarthritis of knee: Secondary | ICD-10-CM | POA: Diagnosis not present

## 2019-12-01 DIAGNOSIS — E240 Pituitary-dependent Cushing's disease: Secondary | ICD-10-CM

## 2019-12-01 DIAGNOSIS — I4891 Unspecified atrial fibrillation: Secondary | ICD-10-CM

## 2019-12-01 DIAGNOSIS — G4730 Sleep apnea, unspecified: Secondary | ICD-10-CM | POA: Diagnosis not present

## 2019-12-01 DIAGNOSIS — I4811 Longstanding persistent atrial fibrillation: Secondary | ICD-10-CM | POA: Diagnosis not present

## 2019-12-01 DIAGNOSIS — A419 Sepsis, unspecified organism: Secondary | ICD-10-CM

## 2019-12-01 DIAGNOSIS — R6520 Severe sepsis without septic shock: Secondary | ICD-10-CM

## 2019-12-01 DIAGNOSIS — U071 COVID-19: Secondary | ICD-10-CM

## 2019-12-01 DIAGNOSIS — G8389 Other specified paralytic syndromes: Secondary | ICD-10-CM | POA: Insufficient documentation

## 2019-12-01 DIAGNOSIS — G35 Multiple sclerosis: Secondary | ICD-10-CM | POA: Diagnosis not present

## 2019-12-01 DIAGNOSIS — I110 Hypertensive heart disease with heart failure: Secondary | ICD-10-CM | POA: Diagnosis not present

## 2019-12-01 DIAGNOSIS — M62838 Other muscle spasm: Secondary | ICD-10-CM | POA: Diagnosis not present

## 2019-12-01 DIAGNOSIS — Z7901 Long term (current) use of anticoagulants: Secondary | ICD-10-CM

## 2019-12-01 LAB — POCT INR: INR: 1.8 — AB (ref 2.0–3.0)

## 2019-12-01 MED ORDER — ACTHAR 80 UNIT/ML IJ GEL: 80.0000 [IU] | INTRAMUSCULAR | 5 refills | Status: DC

## 2019-12-01 MED ORDER — ALPRAZOLAM 0.5 MG PO TABS: 0.5000 mg | ORAL_TABLET | Freq: Every evening | ORAL | 0 refills | Status: DC | PRN

## 2019-12-01 NOTE — Patient Instructions (Signed)
Home Oxygen Use, Adult When a medical condition keeps you from getting enough oxygen, your health care provider may instruct you to take extra oxygen at home. Your health care provider will let you know:  When to take oxygen.  For how long to take oxygen.  How quickly oxygen should be delivered (flow rate), in liters per minute (LPM or L/M). Home oxygen can be given through:  A mask.  A nasal cannula. This is a device or tube that goes in the nostrils.  A transtracheal catheter. This is a small, flexible tube placed in the trachea.  A tracheostomy. This is a surgically made opening in the trachea. These devices are connected with tubing to an oxygen source, such as:  A tank. Tanks hold oxygen in gas form. They must be replaced when the oxygen is used up.  A liquid oxygen device. This holds oxygen in liquid form. It must be replaced when the oxygen is used up.  An oxygen concentrator machine. This filters oxygen in the room. It uses electricity, so you must have a backup cylinder of oxygen in case the power goes out. Supplies needed: To use oxygen, you will need:  A mask, nasal cannula, transtracheal catheter, or tracheostomy.  An oxygen tank, a liquid oxygen device, or an oxygen concentrator.  The tape that your health care provider recommends (optional). If you use a transtracheal catheter and your prescribed flow rate is 1 LPM or greater, you will also need a humidifier. Risks and complications  Fire. This can happen if the oxygen is exposed to a heat source, flame, or spark.  Injury to skin. This can happen if liquid oxygen touches your skin.  Organ damage. This can happen if you get too little oxygen. How to use oxygen Your health care provider or a representative from your medical device company will show you how to use your oxygen device. Follow her or his instructions. The instructions may look something like this: 1. Wash your hands. 2. If you use an oxygen  concentrator, make sure it is plugged in. 3. Place one end of the tube into the port on the tank, device, or machine. 4. Place the mask over your nose and mouth. Or, place the nasal cannula and secure it with tape if instructed. If you use a tracheostomy or transtracheal catheter, connect it to the oxygen source as directed. 5. Make sure the liter-flow setting on the machine is at the level prescribed by your health care provider. 6. Turn on the machine or adjust the knob on the tank or device to the correct liter-flow setting. 7. When you are done, turn off and unplug the machine, or turn the knob to OFF. How to clean and care for the oxygen supplies Nasal cannula  Clean it with a warm, wet cloth daily or as needed.  Wash it with a liquid soap once a week.  Rinse it thoroughly once or twice a week.  Replace it every 2-4 weeks.  If you have an infection, such as a cold or pneumonia, change the cannula when you get better. Mask  Replace it every 2-4 weeks.  If you have an infection, such as a cold or pneumonia, change the mask when you get better. Humidifier bottle  Wash the bottle between each refill: ? Wash it with soap and warm water. ? Rinse it thoroughly. ? Disinfect it and its top. ? Air-dry it.  Make sure it is dry before you refill it. Oxygen concentrator  Clean   the air filter at least twice a week according to directions from your home medical equipment and service company.  Wipe down the cabinet every day. To do this: ? Unplug the unit. ? Wipe down the cabinet with a damp cloth. ? Dry the cabinet. Other equipment  Change any extra tubing every 1-3 months.  Follow instructions from your health care provider about taking care of any other equipment. Safety tips Fire safety tips   Keep your oxygen and oxygen supplies at least 5 ft away from sources of heat, flames, and sparks at all times.  Do not allow smoking near your oxygen. Put up "no smoking" signs in  your home. Avoid smoking areas when in public.  Do not use materials that can burn (are flammable) while you use oxygen.  When you go to a restaurant with portable oxygen, ask to be seated in the nonsmoking section.  Keep a fire extinguisher close by. Let your fire department know that you have oxygen in your home.  Test your home smoke detectors regularly. Traveling  Secure your oxygen tank in the vehicle so that it does not move around. Follow instructions from your medical device company about how to safely secure your tank.  Make sure you have enough oxygen for the amount of time you will be away from home.  If you are planning air travel, contact the airline to find out if they allow the use of an approved portable oxygen concentrator. You may also need documents from your health care provider and medical device company before you travel. General safety tips  If you use an oxygen cylinder, make sure it is in a stand or secured to an object that will not move (fixed object).  If you use liquid oxygen, make sure its container is kept upright.  If you use an oxygen concentrator: ? Tell your electric company. Make sure you are given priority service in the event that your power goes out. ? Avoid using extension cords, if possible. Follow these instructions at home:  Use oxygen only as told by your health care provider.  Do not use alcohol or other drugs that make you relax (sedating drugs) unless instructed. They can slow down your breathing rate and make it hard to get in enough oxygen.  Know how and when to order a refill of oxygen.  Always keep a spare tank of oxygen. Plan ahead for holidays when you may not be able to get a prescription filled.  Use water-based lubricants on your lips or nostrils. Do not use oil-based products like petroleum jelly.  To prevent skin irritation on your cheeks or behind your ears, tuck some gauze under the tubing. Contact a health care  provider if:  You get headaches often.  You have shortness of breath.  You have a lasting cough.  You have anxiety.  You are sleepy all the time.  You develop an illness that affects your breathing.  You cannot exercise at your regular level.  You are restless.  You have difficult or irregular breathing, and it is getting worse.  You have a fever.  You have persistent redness under your nose. Get help right away if:  You are confused.  You have blue lips or fingernails.  You are struggling to breathe. Summary  Your health care provider or a representative from your medical device company will show you how to use your oxygen device. Follow her or his instructions.  If you use an oxygen concentrator, make   sure it is plugged in.  Make sure the liter-flow setting on the machine is at the level prescribed by your health care provider.  Keep your oxygen and oxygen supplies at least 5 ft away from sources of heat, flames, and sparks at all times. This information is not intended to replace advice given to you by your health care provider. Make sure you discuss any questions you have with your health care provider. Document Revised: 01/24/2018 Document Reviewed: 02/29/2016 [employer] Patient Education  2020 [employer]  

## 2019-12-01 NOTE — Progress Notes (Signed)
PATIENT: Toni [name] DOB: 07/22/1946  REASON FOR VISIT: follow up MS, OSA, Spasms, / renal cell carcinoma - Dr. Ernst Bowler   HISTORY FROM: patient alone   HISTORY OF PRESENT ILLNESS:  Today 12/01/19,   Toni [name] is seen on 12-01-2019. She has been going through an odyssey over the year 3/20 - 3/21.  Toni [name] has a significant history of paroxysmal atrial fibrillation and post chronically antianticoagulated on warfarin.  She had a complicated case of obstructive sleep apnea and has used nocturnal BiPAP but most recently has not found a reduction in apnea or hypopnea.  But she has been very compliant at 100% with a setting of 16/11 cmH2O her AHI became 35/h.  And at this time I would see no other explanation she has had very little air leakage, remaining apneas are not central but obstructive.  I think this is her chest wall weakness that CPAP or BiPAP alone cannot compensate for.  I would like for her to discontinue its use as she also dislikes it.  1 she reported aerophagia- since COVID- she has felt that the PAP therapy is tidious.  She was infected with COVID at [facility]- her diet was not the one prescribed, she was miserable. She moved on to [place]. She is a survivor.  She has received both vaccines- actually at [facility] She is bothered by nausea, vertigo since Covid 19, her sleep apnea is not responding to BiPAP- AHI was over 30 and she had hypoxemia. She cannot get oxygen from our [place] [facility] center , because I would have to test her, titrate her to 0xygen - while in the lab on BIPAP.  She has no leaks- she is 100% compliant.  She is also my MS patient - and has had MS for 30 years. Has been on acthar- ACTH- she gets a boost.Acthar also has a 2000 USD copay. Has atrial fibrillation.  She has wanted to inquire about new therapies, since she still feels relapses may affect her. She now has a caregiver living at the house, helping her husband out. This caregiver is  exercising with her. Al family members and caretakers are vaccinated.  She will undergo a HST, and we will look if central apneas have arisen, how low oxygen dips.  And after that we will organize a OXYGEN on BIPAP trial. Or titrate to ASV next? She has a medical bed at home and can hardly come to the lab.  I will try to order oxygen 2 liters per BPAP for her. I am not optimistic.  We discussed Ocrevus. I will consult with dr sater. I check the airflow  On BiPAP- reviewed ONO, BiPAP compliance.       Rv: I have the pleasure of seeing Toni [name] today,  A 74 year old female MS patient an sleep patient -my last visit with her was 4 months ago.  She presents today having lost again a significant amount of weight.  She follows Dr. Leafy Ro at the medical weight management clinic.  With her weight loss came also the needs to refit her CPAP mask.  She is using a BiPAP setting of 20/15 cmH2O and her residual AHI is 4.9 the best for long time.  She does have minimal leaks which speaks for the good fit of her current mask.  She uses the machine 100% of the time of 7 hours and 2 minutes of average use.  I would not like to change  any settings and also the patient reports that she has pressure marks and sometimes almost bruising from her facial mask seems to be the best she has done in a long time.  Her Epworth sleepiness score also has improved she only endorsed 12 out of 24 points and she endorsed 20 out of 63 points of the fatigue severity score.  I see her as much less fatigued, and more optimistic.  Overall stronger with Pt , not longer with home health- now with "[facility]" 3 times a week. She now can stand long enough to pull up her pants. Her last labs were reviewed. She is in atrial fibrillation- rate controlled on diltiazem 180 mg, BP is lower.    Dr [name]: History: Toni [name] is a 74 year old patient with a history of multiple sclerosis with a spastic paraparesis.  The patient recently has been in  the hospital and subsequently in an extended care facility with an exacerbation of her multiple sclerosis with right-sided weakness.  She has not been ambulatory.  She believes that the spasticity of her abdomen has worsened.  She has more weakness of the right leg.  She comes in for a baclofen pump refill.  Baclofen pump refill note  The baclofen pump site was cleaned with Betadine solution. A 21-gauge needle was inserted into the pump port site. Approximately 11 cc of residual baclofen was removed, the pump indicates a 6.5 residual.40 cc of replacement baclofen was placed into the pump at 2000 mcg/cc concentration. The pump was reprogrammed for the following settings: The pump was increased from a simple continuous rate of 799.5 mcg/day to a simple continuous rate of 819.4 mcg daily, a 2% increase. The alarm volume is set at 1.5 cc. The next alarm date is Dec 26, 2019. The patient tolerated the procedure well. There were no complications of the above procedure. The [place] number is [phone] The baclofen expiration date is December 2022. The baclofen lot number is [id number].   Toni [name]  has been seen in this office for MS for 30 years, was diagnosed by Dr. Rosiland Oz in [phone]. Toni [name] had several days of severe spasms, feeling tight, and she was unable to transfer, to walk with her walker. At baseline she can walk 40-50 feet. She was asked to used acthar 2 days in a row, and it seems to help, she has 6 more to go. She did not feel any improvement after taking baclofen.  She has been successful in losing a lot of weight under Dr. Migdalia Dk medical weight management program, at that clinic she needs to stay and stand still on a special scale which allows the differentiation between total weight, water weight fluid and fat content.  She is not able right now to stand still enough to allow this machine to work.  However her weight loss has been so significant that she has some loose skin now up to 18 her  current weight is 218 pounds and this will certainly support her apnea treatment.  Orders the patient is here followed also for complex and central sleep apnea, the control had been important as she also developed atrial fibrillation followed by Dr. Debara Pickett.   12-19-2017, RV . Follow up visit with Toni [name], better known as Toni [name], established 74 year old Caucasian female patient with long-standing history of MS.  She just celebrated her 46th wedding anniversary with her husband.  The patient has been followed for a prescription pump and fill by Dr. [name] she has an  appointment on April 11 and at that time her baclofen rate was increased to 799.5 mcg/day, for a day if she felt floppy and weak.  The very next day the dose was reduced to 700.3 mcg a day and since then she has been doing okay.  She still uses actor, she still follows with Dr. Leafy Ro at the medical weight management clinic.  Dr. Debara Pickett is her cardiologist, Dr. Dagmar Hait her primary care physician. The patient most recently saw Dr. Leafy Ro she was unable to step up to the scale and told safely on.  Dr. Leafy Ro obtain a metabolic panel which showed hypokalemia we will review this in detail later on today's visit, she has been started on potassium supplements.   Date , MM: Toni [name] is a 74 year old female with a history of multiple sclerosis and obstructive sleep apnea on BiPAP.   she returns today for follow-up.  Her BiPAP indicates that she use her machine 30 out of 30 days for compliance of 100%.  She use her machine greater than 4 hours 28 out of 30 days for compliance of 93%.  On average she uses her machine 7 hours and 14 minutes.  Her IPAP is 20 cm of water and EPAP pressure of 15 cm of water her residual AHI is 16.7.  She does not have a significant leak.  The patient reports that she has been seeing Dr. Leafy Ro for weight loss.  Reports that she is lost approximately 30 pounds.  She has an appointment this coming Wednesday.  The patient  denies any new numbness or weakness does feel like her hands is getting gradually weaker.  She states that she does participate in physical therapy 1 time a week but then does her own exercises 3 times a week.  She uses a wheelchair primarily.  She reports that she has several more months of Betaseron left but once her prescription runs out she plans to stop this medication.  She reports that taking Acthar works better for her.  She reports that she has been on Copaxone and Avonex but never any of the oral medications.  She returns today for an evaluation.  HISTORY :My long-standing patient Toni [name] has struggled trying to control her complex sleep apneas , which are central and obstructive in nature. She has started on a BiPAP setting of 20/15 cm water and has been 100% compliance for the last 30 days with an average of 7 hours and 42 minutes. The residual AHI is still 13.9 area advanced home care did call her and told her that there with 3 days consecutively during which her AHI was markedly reduced. This occurred during May, and it related to Toni [name] being given at double dose of diuretics , while she was at [facility] rehab. It was most nights after a high diuretic was given that she slept the best and happily with congestive central apnea. In the meantime, her cardiologist, Dr. Debara Pickett, has increased Lasix from 60-80 mg daily but she has not had the same effect as she had at 100 mg daily. She is continuously in a fib now. No ablation as long as apnea is not treated.   Dr. Leafy Ro did an extensive lab panel,  Good HB a1c. She was low on protein- and she sees a nutritionist. Toni [name] is now cooking for her. Lots of chicken and vegetables- and she enjoys the food.   MS stable on Acthar. No diabetes on acthar and no relapses into acute a fib.  Melatonin helps sleep. Given that Toni [name] has suffered from [place] for almost 30 years now.   REVIEW OF SYSTEMS: Out of a complete 14 system review of symptoms, the  patient complains only of the following symptoms, and all other reviewed systems are negative. Ankle and lag edema, Urinary urge.  She uses a BiPAP - 20-15 cm water.  Epworth 8 on Acthar and Baclofen .   ALLERGIES: Allergies  Allergen Reactions  . Chlorhexidine Hives  . Hydrocodone Nausea And Vomiting    Hydrocodone causes vomiting  . Oxycodone Nausea And Vomiting    Oral oxycodone causes vomiting  . Chlorhexidine Gluconate Rash    Sores at site  . Zoloft [Sertraline] Hives, Swelling and Rash    HOME MEDICATIONS: Outpatient Medications Prior to Visit  Medication Sig Dispense Refill  . acetaminophen (TYLENOL) 500 MG tablet Take 500-1,000 mg by mouth every 6 (six) hours as needed for fever or headache (or pain).    Marland Kitchen ALPRAZolam (XANAX) 0.5 MG tablet Take 1 tablet (0.5 mg total) by mouth at bedtime as needed for anxiety. 30 tablet 0  . Ascorbic Acid (VITAMIN C) 500 MG CAPS Take 500 mg by mouth daily.    . baclofen (LIORESAL) 20 MG tablet Once to two tablets a day for breakthrough spasms (Patient taking differently: Take 20 mg by mouth 2 (two) times daily as needed for muscle spasms. ) 180 tablet 0  . buPROPion (WELLBUTRIN XL) 300 MG 24 hr tablet Take 1 tablet (300 mg total) by mouth daily. 30 tablet 0  . Calcium Carbonate-Vit D-Min (CALTRATE 600+D PLUS MINERALS) 600-800 MG-UNIT TABS Take 2 tablets by mouth 2 (two) times daily.    . cetirizine (ZYRTEC) 10 MG tablet Take 10 mg by mouth daily as needed for allergies.     . Cholecalciferol (VITAMIN D) 2000 units tablet Take 1 tablet (2,000 Units total) by mouth 2 (two) times daily.  0  . Coenzyme Q10 (CO Q-10 PO) Take 1 tablet by mouth daily.    . corticotropin (ACTHAR) 80 UNIT/ML injectable gel INJECT 80 UNITS (1ML) DAILY FOR 10 DAYS. DUE TO MS EXACERBATION AND THEN CAN RESUME NEXT MONTH AT PREVIOUS DOSE. (Patient taking differently: Inject 80 Units into the muscle every other day. For 10 days each month.) 10 mL 1  . Cranberry 1000 MG CAPS  Take 2 capsules by mouth 2 (two) times daily.     Marland Kitchen docusate sodium (COLACE) 100 MG capsule Take 1 capsule (100 mg total) by mouth daily. 10 capsule 0  . famotidine (PEPCID) 20 MG tablet Take 20 mg by mouth at bedtime.   6  . fluticasone (FLONASE) 50 MCG/ACT nasal spray Place 2 sprays into both nostrils daily as needed for allergies.     . furosemide (LASIX) 20 MG tablet Take 1 tablet by mouth daily, may take second dose if needed for excess edema 180 tablet 3  . gabapentin (NEURONTIN) 300 MG capsule TAKE 1 CAPSULE BY MOUTH UP  TO 4 TIMES DAILY (Patient taking differently: Take 300 mg by mouth 4 (four) times daily. ) 360 capsule 3  . GENERLAC 10 GM/15ML SOLN TAKE 15 ML BY MOUTH TWICE DAILY AS NEEDED (Patient taking differently: Take 10 g by mouth 2 (two) times daily as needed (constipation). ) 946 mL 1  . levothyroxine (SYNTHROID, LEVOTHROID) 75 MCG tablet Take 75 mcg by mouth daily.    . melatonin 3 MG TABS tablet Take 3 mg by mouth at bedtime.    . midodrine (  PROAMATINE) 5 MG tablet Take 1 tablet (5 mg total) by mouth 3 (three) times daily with meals. 270 tablet 3  . Omega-3 Fatty Acids (FISH OIL PO) Take 1 capsule by mouth daily.    Marland Kitchen omeprazole (PRILOSEC) 20 MG capsule Take 20 mg by mouth daily.     . ondansetron (ZOFRAN) 4 MG tablet Take 4-8 mg by mouth every 8 (eight) hours as needed for nausea or vomiting.     . psyllium (METAMUCIL SMOOTH TEXTURE) 28 % packet Take 1 packet by mouth daily.     . simvastatin (ZOCOR) 10 MG tablet Take 10 mg by mouth every evening.     Marland Kitchen SYRINGE/NEEDLE, DISP, 1 ML (BD LUER-LOK SYRINGE) 25G X 5/8" 1 ML MISC Use as directed to inject  Acthar 10 each prn  . Tamsulosin HCl (FLOMAX) 0.4 MG CAPS Take 0.4 mg by mouth daily as needed (bladder emptying).     . warfarin (COUMADIN) 5 MG tablet Take 1/2 to 1 tablet by mouth daily as directed by coumadin clinic. 90 tablet 1  . diltiazem (CARDIZEM CD) 180 MG 24 hr capsule Take 1 capsule (180 mg total) by mouth daily. 30  capsule 0  . diltiazem (CARDIZEM) 60 MG tablet Take 1 tablet (60 mg total) by mouth every 6 (six) hours as needed for up to 7 days (HR sustained >110bpm). 28 tablet 0  . Melatonin 3 MG TABS Take 1 tablet (3 mg total) by mouth at bedtime as needed (sleep). 30 tablet 0   No facility-administered medications prior to visit.    PAST MEDICAL HISTORY: Past Medical History:  Diagnosis Date  . Abnormality of gait 03/01/2015  . Anxiety   . Arthritis    "knees" (01/19/2016)  . Atrial fibrillation with RVR ([place]) 01/19/2016  . Back pain   . Chest pain   . Childhood asthma   . Chronic pain    "nerve pain from the MS"  . Constipation   . Depression   . DVT (deep venous thrombosis) ([place]) 1983   "RLE; may have just been phlebitis; it was before the age of dopplers"  . 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 GERD (gastroesophageal reflux disease)   . History of stress test 06/21/10   limited exam with some degree of breast attenuatin of the apex however a component of apical ischemia is not excluded, EF 62%; cardiac cath   . HLD (hyperlipidemia)   . Hx of echocardiogram 04/22/10   EF 55-60%, no valve issues  . Hypertension   . Hypothyroidism   . Joint pain   . Migraine    "none since I stopped beta blocker in 1990s" (01/19/2016)  . Multiple sclerosis ([place])    has had this may 1989-[facility] reg doc  . OSA on CPAP   . Osteoarthritis   . PAF (paroxysmal atrial fibrillation) ([place])    on coumadin; documented on monitor 06/2010  . Pneumonia 11/2011  . Shortness of breath   . Thyroid disease     PAST SURGICAL HISTORY: Past Surgical History:  Procedure Laterality Date  . ANTERIOR CERVICAL DECOMP/DISCECTOMY FUSION  01/2004  . BACK SURGERY    . CARDIAC CATHETERIZATION  06/22/2010   normal coronary arteries, PAF  . CATARACT EXTRACTION, BILATERAL Bilateral   . CHILD BIRTHS     X2  . DILATION AND CURETTAGE OF UTERUS    . INFUSION PUMP  IMPLANTATION  ~  2009   baclofen infusion in lower abd  . PAIN PUMP REVISION N/A 07/29/2014   Procedure: Baclofen pump replacement;  Surgeon: Erline Levine, MD;  Location: [facility];  Service: Neurosurgery;  Laterality: N/A;  Baclofen pump replacement  . TONSILLECTOMY AND ADENOIDECTOMY    . VARICOSE VEIN SURGERY  ~ 1968  . VENOUS ABLATION  12/16/2010   radiofreq ablation -Dr Elisabeth Cara and [facility]  . WISDOM TOOTH EXTRACTION      FAMILY HISTORY: Family History  Problem Relation Age of Onset  . Coronary artery disease Father        at age 64  . Hyperlipidemia Father   . Thyroid disease Father   . Coronary artery disease Maternal Grandmother   . Depression Maternal Grandmother   . Cancer Paternal Grandmother   . Depression Mother   . Sudden death Mother   . Anxiety disorder Mother   . Alcoholism Son     SOCIAL HISTORY: Social History   Socioeconomic History  . Marital status: Married    Spouse name: Toni [name]  . Number of children: 2  . Years of education: COLLEGE  . Highest education level: Not on file  Occupational History  . Occupation: RETIRED  Tobacco Use  . Smoking status: Former Smoker    Packs/day: 0.50    Years: 7.00    Pack years: 3.50    Types: Cigarettes    Quit date: 08/21/1976    Years since quitting: 43.3  . Smokeless tobacco: Never Used  Substance and Sexual Activity  . Alcohol use: Yes    Alcohol/week: 1.0 standard drinks    Types: 1 Glasses of wine per week  . Drug use: No  . Sexual activity: Not Currently  Other Topics Concern  . Not on file  Social History Narrative   Lives in [place] with Husband [name] [phone], [phone]   Currently in rehab   Social Determinants of Health   Financial Resource Strain:   . Difficulty of Paying Living Expenses:   Food Insecurity:   . Worried About Charity fundraiser in the Last Year:   . Arboriculturist in the Last Year:   Transportation Needs:   . Film/video editor (Medical):   Marland Kitchen Lack of Transportation  (Non-Medical):   Physical Activity:   . Days of Exercise per Week:   . Minutes of Exercise per Session:   Stress:   . Feeling of Stress :   Social Connections:   . Frequency of Communication with Friends and Family:   . Frequency of Social Gatherings with Friends and Family:   . Attends Religious Services:   . Active Member of Clubs or Organizations:   . Attends Archivist Meetings:   Marland Kitchen Marital Status:   Intimate Partner Violence:   . Fear of Current or Ex-Partner:   . Emotionally Abused:   Marland Kitchen Physically Abused:   . Sexually Abused:       PHYSICAL EXAM  Vitals:   12/01/19 1512  BP: 122/62  Pulse: 82  Temp: 97.6 F (36.4 C)   There is no height or weight on file to calculate BMI.  Generalized: Well developed, in no acute distress, wheelchair    irregular heart beat, a fibrillation.  Wheelchair bound. .  Excess skin after weight loss, chin and upper arms.  . bilateral edema up to the knees. Broken blood vessels.  Lungs are clear to auscultation.   Neurological examination :  Mentation: Alert oriented to time, place, history  taking. Follows all commands speech and language fluent Cranial nerve : Pupils were equal round reactive to light. Extraocular movements were full, visual field were full on confrontational test. Facial sensation and strength were normal. Tongue in midline.  Head turning and shoulder shrug  were normal and symmetric. Motor: The patient cannot fully extend her right upper extremity at the elbow, but she has good biceps flexion strength.   Grip strength is weak and bilaterally there is atrophy of the thenar eminence noted.   Her fingers feel stiff, she noted an abdominal and chest spasm, she is unable right now to walk has lower back pain and feels generalized weak and heavy.  After has given her some energy back.  As to sensory she noticed ongoing numbness in the fingers of both hands the pounds but no numbness in the legs.  Loss of sensation  at the feet and ankle which may be related to the significant edema.  We did not ambulate   DIAGNOSTIC DATA (LABS, IMAGING, TESTING) - I reviewed patient records, labs, notes, testing and imaging myself where available.  Lab Results  Component Value Date   WBC 7.2 10/17/2019   HGB 12.2 10/17/2019   HCT 37.0 10/17/2019   MCV 94.4 10/17/2019   PLT 201 10/17/2019      Component Value Date/Time   NA 139 10/17/2019 0240   NA 145 (H) 03/19/2019 1517   K 3.7 10/17/2019 0240   CL 98 10/17/2019 0240   CO2 32 10/17/2019 0240   GLUCOSE 90 10/17/2019 0240   BUN 23 10/17/2019 0240   BUN 24 03/19/2019 1517   CREATININE 0.35 (L) 10/17/2019 0240   CALCIUM 9.0 10/17/2019 0240   PROT 6.0 (L) 10/16/2019 0226   PROT 5.5 (L) 03/19/2019 1517   ALBUMIN 3.1 (L) 10/16/2019 0226   ALBUMIN 3.7 03/19/2019 1517   AST 19 10/16/2019 0226   ALT 17 10/16/2019 0226   ALKPHOS 49 10/16/2019 0226   BILITOT 0.6 10/16/2019 0226   BILITOT 0.2 03/19/2019 1517   GFRNONAA >60 10/17/2019 0240   GFRAA >60 10/17/2019 0240   Lab Results  Component Value Date   CHOL 111 03/19/2019   HDL 57 03/19/2019   LDLCALC 43 03/19/2019   TRIG 53 03/19/2019   CHOLHDL 3.0 04/22/2010   Lab Results  Component Value Date   HGBA1C 5.0 03/19/2019   No results found for: VITAMINB12 Lab Results  Component Value Date   TSH 1.374 10/10/2019      ASSESSMENT AND PLAN 74 y.o. year old female  has a past medical history of Abnormality of gait (03/01/2015), Anxiety, Arthritis, Atrial fibrillation with RVR ([place]) (01/19/2016), Back pain, Chest pain, Childhood asthma, Chronic pain, Constipation, Depression, DVT (deep venous thrombosis) ([place]) (1983), Edema, GERD (gastroesophageal reflux disease), History of stress test (06/21/10), HLD (hyperlipidemia), echocardiogram (04/22/10), Hypertension, Hypothyroidism, Joint pain, Migraine, Multiple sclerosis ([place]), OSA on CPAP, Osteoarthritis, PAF (paroxysmal atrial fibrillation) ([place]), Pneumonia  (11/2011), Shortness of breath, and Thyroid disease. here with:  1.  COMPLEX sleep apnea on BiPAP- has enough central apneas to cause her atrial fib, but we were unable to control her apnea and she is therefor not a cardioversion candidate.  download of PAP- Toni [name] has been extremely compliant has used at 100% of the last 90 days was 100% compliance by time,  her BiPAP machine has a inspiratory pressure setting of 20 expiratory pressure of 15 her-  AHI is 4.9  which is significantly reduced over her baseline,  2.  Multiple sclerosis on Acthar, which she hs sparingly used over the last 6 mon 3. PT helped with gait problems, sensory abnormalities and " MS hugs" .4. Atrial fibrillation,chronic and persistent.   She will continue on BiPAP therapy 20 over 15 cm. The patient wants to continue on Betaseron- and she will remain on Acthar.  Dr [name] will try an increase of baclofen application by pump. As described above, she is now on 700 mcg a day.   Renal cell carcinoma - Dr. Tresa Moore- [facility] .    She is advised that if her symptoms worsen or she develops new symptoms she should let us know.  She is very much better- more strength, optimism and no falls.the weight loss has much improved her central and obstructive apneas.    Montreal Cognitive Assessment  10/04/2015 06/02/2015 03/30/2015  Visuospatial/ Executive (0/5) 4 5 5   Naming (0/3) 3 3 3   Attention: Read list of digits (0/2) 2 2 2   Attention: Read list of letters (0/1) 1 1 1   Attention: Serial 7 subtraction starting at 100 (0/3) 3 3 3   Language: Repeat phrase (0/2) 2 2 2   Language : Fluency (0/1) 1 1 1   Abstraction (0/2) 2 2 2   Delayed Recall (0/5) 5 5 5   Orientation (0/6) 6 6 6   Total 29 30 30   Adjusted Score (based on education) 29 30 30       Visit was 35 minutes long and addressed 3 problems.  MS therapies, affortable ACTH treatment/ ocrevus.   BIPAP to d/c unless we get Oxygen with it. Sleep with head of bed elevated.     Baclofen pump to be renewed, Dr [name].     [name], MD  12/01/2019, 3:46 PM [facility] [address], [place], [place]  [address] [phone]

## 2019-12-01 NOTE — Addendum Note (Signed)
Addended by: [name] on: 12/01/2019 04:48 PM   Modules accepted: Orders

## 2019-12-01 NOTE — Telephone Encounter (Signed)
I can't see where anyone from pulmonary has ever evaluated her. You can make her a new referral for nocturnal hypoxemia, next available provider with Dr Brett Fairy as the referring MD. Thanks.

## 2019-12-02 ENCOUNTER — Telehealth: Payer: Self-pay | Admitting: Neurology

## 2019-12-02 DIAGNOSIS — I5032 Chronic diastolic (congestive) heart failure: Secondary | ICD-10-CM | POA: Diagnosis not present

## 2019-12-02 DIAGNOSIS — M170 Bilateral primary osteoarthritis of knee: Secondary | ICD-10-CM | POA: Diagnosis not present

## 2019-12-02 DIAGNOSIS — G35 Multiple sclerosis: Secondary | ICD-10-CM | POA: Diagnosis not present

## 2019-12-02 DIAGNOSIS — I110 Hypertensive heart disease with heart failure: Secondary | ICD-10-CM | POA: Diagnosis not present

## 2019-12-02 DIAGNOSIS — I4811 Longstanding persistent atrial fibrillation: Secondary | ICD-10-CM | POA: Diagnosis not present

## 2019-12-02 DIAGNOSIS — M62838 Other muscle spasm: Secondary | ICD-10-CM | POA: Diagnosis not present

## 2019-12-02 NOTE — Telephone Encounter (Signed)
Pt called stating that she forgot to address a concern that she had yesterday in her appt. Pt states that several times in the last few months her caregiver or a nurse or someone have had trouble waking her up and she is wanting to know if this has to do with the low oxygen level at night. Please advise.

## 2019-12-03 ENCOUNTER — Other Ambulatory Visit: Payer: Self-pay | Admitting: Neurology

## 2019-12-03 DIAGNOSIS — I5032 Chronic diastolic (congestive) heart failure: Secondary | ICD-10-CM | POA: Diagnosis not present

## 2019-12-03 DIAGNOSIS — G35 Multiple sclerosis: Secondary | ICD-10-CM | POA: Diagnosis not present

## 2019-12-03 DIAGNOSIS — I110 Hypertensive heart disease with heart failure: Secondary | ICD-10-CM | POA: Diagnosis not present

## 2019-12-03 DIAGNOSIS — I4811 Longstanding persistent atrial fibrillation: Secondary | ICD-10-CM | POA: Diagnosis not present

## 2019-12-03 DIAGNOSIS — M170 Bilateral primary osteoarthritis of knee: Secondary | ICD-10-CM | POA: Diagnosis not present

## 2019-12-03 DIAGNOSIS — M62838 Other muscle spasm: Secondary | ICD-10-CM | POA: Diagnosis not present

## 2019-12-03 MED ORDER — ALPRAZOLAM 0.5 MG PO TABS: 0.5000 mg | ORAL_TABLET | Freq: Two times a day (BID) | ORAL | 0 refills | Status: AC | PRN

## 2019-12-03 NOTE — Telephone Encounter (Signed)
Called the patient back and advised her that the oxygen level can play a role into why she is having a harder time being woke up. Advised that Dr Brett Fairy was discussing with Dr Debara Pickett about seeing if he would have any luck with getting her oxygen approved since we are unable to complete that and order without completing a overnight sleep study. Pt states that she heard from Dr [facility] office and I advised her to go ahead and schedule a sooner apt if he has one available to see if can get oxygen process started. Pt verbalized understanding. Pt also said she will shred the printed prescription for the alprazolam because she needs it send to [facility]. I will resend for Dr Brett Fairy to send to [facility] for her.

## 2019-12-04 ENCOUNTER — Telehealth: Payer: Self-pay | Admitting: Internal Medicine

## 2019-12-04 NOTE — Telephone Encounter (Signed)
Okay to continue therapy. Diltiazem and simvastatin interaction will represent a problem ONLY with simvastatin doses above 10mg  or cardizem above 240mg .

## 2019-12-04 NOTE — Telephone Encounter (Signed)
Called  Toni [name] PT back with pharmacists review of medications. "okay to continue therapy. Diltiazem and simvastatin interaction will represent a problem ONLY with simvastatin doses above 10mg  or cardizem above 240mg ."- Raquel RPH Toni [name] reports that she spoke with the pt who states that she takes the 180mg  of diltiazem daily and only that the 60mg  as needed for her afib. She verbalized understanding of Pharmacists review and was thankful for the follow up call. No other questions at this time.

## 2019-12-04 NOTE — Telephone Encounter (Signed)
Penni Bombard, PT with [facility] is calling to report a level 1 medication interaction. She states the patient is taking simvastatin (ZOCOR) 10 MG tablet while also taking Cartia XT 120 MG. I made Jeannine aware that I see diltiazem (CARDIZEM) listed with status of expired. However, we do not have Cartia XT listed. She states she is calling the patient to ensure that the patient is no longer taking the medication and will give our office a call back shortly.  Edmonia Lynch may be contacted at [phone].

## 2019-12-04 NOTE — Telephone Encounter (Signed)
Called and spoke with Penni Bombard PT with [facility] She reports that they are required to call in with Level 1 Medication interactions and this is why she called with the Zocor and Cardizem.  She states she was going to call the pt to see if she is still taking either medication but has not done so yet and that she will need to be notified with any recommendations as well.  From the pts chart, the last time her Cardizem was filled was 10/18/19 and her Zocor has not been refilled since 03/01/18  Will route to Dr.Hilty and PharmD for review.

## 2019-12-05 DIAGNOSIS — M170 Bilateral primary osteoarthritis of knee: Secondary | ICD-10-CM | POA: Diagnosis not present

## 2019-12-05 DIAGNOSIS — M62838 Other muscle spasm: Secondary | ICD-10-CM | POA: Diagnosis not present

## 2019-12-05 DIAGNOSIS — G35 Multiple sclerosis: Secondary | ICD-10-CM | POA: Diagnosis not present

## 2019-12-05 DIAGNOSIS — I5032 Chronic diastolic (congestive) heart failure: Secondary | ICD-10-CM | POA: Diagnosis not present

## 2019-12-05 DIAGNOSIS — I4811 Longstanding persistent atrial fibrillation: Secondary | ICD-10-CM | POA: Diagnosis not present

## 2019-12-05 DIAGNOSIS — I110 Hypertensive heart disease with heart failure: Secondary | ICD-10-CM | POA: Diagnosis not present

## 2019-12-07 DIAGNOSIS — G4733 Obstructive sleep apnea (adult) (pediatric): Secondary | ICD-10-CM | POA: Diagnosis not present

## 2019-12-07 DIAGNOSIS — I8393 Asymptomatic varicose veins of bilateral lower extremities: Secondary | ICD-10-CM | POA: Diagnosis not present

## 2019-12-07 DIAGNOSIS — M62838 Other muscle spasm: Secondary | ICD-10-CM | POA: Diagnosis not present

## 2019-12-07 DIAGNOSIS — Z8616 Personal history of COVID-19: Secondary | ICD-10-CM | POA: Diagnosis not present

## 2019-12-07 DIAGNOSIS — F419 Anxiety disorder, unspecified: Secondary | ICD-10-CM | POA: Diagnosis not present

## 2019-12-07 DIAGNOSIS — Z8701 Personal history of pneumonia (recurrent): Secondary | ICD-10-CM | POA: Diagnosis not present

## 2019-12-07 DIAGNOSIS — I4811 Longstanding persistent atrial fibrillation: Secondary | ICD-10-CM | POA: Diagnosis not present

## 2019-12-07 DIAGNOSIS — K219 Gastro-esophageal reflux disease without esophagitis: Secondary | ICD-10-CM | POA: Diagnosis not present

## 2019-12-07 DIAGNOSIS — E785 Hyperlipidemia, unspecified: Secondary | ICD-10-CM | POA: Diagnosis not present

## 2019-12-07 DIAGNOSIS — Z7901 Long term (current) use of anticoagulants: Secondary | ICD-10-CM | POA: Diagnosis not present

## 2019-12-07 DIAGNOSIS — I110 Hypertensive heart disease with heart failure: Secondary | ICD-10-CM | POA: Diagnosis not present

## 2019-12-07 DIAGNOSIS — I5032 Chronic diastolic (congestive) heart failure: Secondary | ICD-10-CM | POA: Diagnosis not present

## 2019-12-07 DIAGNOSIS — J45909 Unspecified asthma, uncomplicated: Secondary | ICD-10-CM | POA: Diagnosis not present

## 2019-12-07 DIAGNOSIS — Z5181 Encounter for therapeutic drug level monitoring: Secondary | ICD-10-CM | POA: Diagnosis not present

## 2019-12-07 DIAGNOSIS — G35 Multiple sclerosis: Secondary | ICD-10-CM | POA: Diagnosis not present

## 2019-12-07 DIAGNOSIS — M170 Bilateral primary osteoarthritis of knee: Secondary | ICD-10-CM | POA: Diagnosis not present

## 2019-12-07 DIAGNOSIS — E039 Hypothyroidism, unspecified: Secondary | ICD-10-CM | POA: Diagnosis not present

## 2019-12-07 DIAGNOSIS — K5900 Constipation, unspecified: Secondary | ICD-10-CM | POA: Diagnosis not present

## 2019-12-07 DIAGNOSIS — Z86718 Personal history of other venous thrombosis and embolism: Secondary | ICD-10-CM | POA: Diagnosis not present

## 2019-12-07 DIAGNOSIS — G43909 Migraine, unspecified, not intractable, without status migrainosus: Secondary | ICD-10-CM | POA: Diagnosis not present

## 2019-12-07 DIAGNOSIS — G8929 Other chronic pain: Secondary | ICD-10-CM | POA: Diagnosis not present

## 2019-12-07 DIAGNOSIS — M549 Dorsalgia, unspecified: Secondary | ICD-10-CM | POA: Diagnosis not present

## 2019-12-07 DIAGNOSIS — Z79899 Other long term (current) drug therapy: Secondary | ICD-10-CM | POA: Diagnosis not present

## 2019-12-07 DIAGNOSIS — I951 Orthostatic hypotension: Secondary | ICD-10-CM | POA: Diagnosis not present

## 2019-12-07 DIAGNOSIS — F329 Major depressive disorder, single episode, unspecified: Secondary | ICD-10-CM | POA: Diagnosis not present

## 2019-12-08 ENCOUNTER — Telehealth: Payer: Self-pay

## 2019-12-08 ENCOUNTER — Encounter (INDEPENDENT_AMBULATORY_CARE_PROVIDER_SITE_OTHER): Payer: Self-pay

## 2019-12-08 ENCOUNTER — Ambulatory Visit (INDEPENDENT_AMBULATORY_CARE_PROVIDER_SITE_OTHER): Payer: Medicare Other | Admitting: Cardiovascular Disease

## 2019-12-08 ENCOUNTER — Ambulatory Visit (INDEPENDENT_AMBULATORY_CARE_PROVIDER_SITE_OTHER): Payer: Medicare Other | Admitting: Family Medicine

## 2019-12-08 ENCOUNTER — Other Ambulatory Visit: Payer: Self-pay

## 2019-12-08 DIAGNOSIS — M62838 Other muscle spasm: Secondary | ICD-10-CM | POA: Diagnosis not present

## 2019-12-08 DIAGNOSIS — G35 Multiple sclerosis: Secondary | ICD-10-CM | POA: Diagnosis not present

## 2019-12-08 DIAGNOSIS — E669 Obesity, unspecified: Secondary | ICD-10-CM

## 2019-12-08 DIAGNOSIS — I110 Hypertensive heart disease with heart failure: Secondary | ICD-10-CM | POA: Diagnosis not present

## 2019-12-08 DIAGNOSIS — I4811 Longstanding persistent atrial fibrillation: Secondary | ICD-10-CM | POA: Diagnosis not present

## 2019-12-08 DIAGNOSIS — G4737 Central sleep apnea in conditions classified elsewhere: Secondary | ICD-10-CM

## 2019-12-08 DIAGNOSIS — F418 Other specified anxiety disorders: Secondary | ICD-10-CM | POA: Diagnosis not present

## 2019-12-08 DIAGNOSIS — I4891 Unspecified atrial fibrillation: Secondary | ICD-10-CM

## 2019-12-08 DIAGNOSIS — Z7901 Long term (current) use of anticoagulants: Secondary | ICD-10-CM

## 2019-12-08 DIAGNOSIS — R0902 Hypoxemia: Secondary | ICD-10-CM

## 2019-12-08 DIAGNOSIS — Z6834 Body mass index (BMI) 34.0-34.9, adult: Secondary | ICD-10-CM

## 2019-12-08 DIAGNOSIS — M170 Bilateral primary osteoarthritis of knee: Secondary | ICD-10-CM | POA: Diagnosis not present

## 2019-12-08 DIAGNOSIS — I5032 Chronic diastolic (congestive) heart failure: Secondary | ICD-10-CM | POA: Diagnosis not present

## 2019-12-08 DIAGNOSIS — G4733 Obstructive sleep apnea (adult) (pediatric): Secondary | ICD-10-CM

## 2019-12-08 LAB — POCT INR: INR: 2.4 (ref 2.0–3.0)

## 2019-12-08 NOTE — Telephone Encounter (Signed)
Medicare will not pay for a HST with her medical history. We need a Bipap order.  We will try to get her oxygen if needed. Spoke with Dr. Brett Fairy on this patient.

## 2019-12-09 DIAGNOSIS — M170 Bilateral primary osteoarthritis of knee: Secondary | ICD-10-CM | POA: Diagnosis not present

## 2019-12-09 DIAGNOSIS — I5032 Chronic diastolic (congestive) heart failure: Secondary | ICD-10-CM | POA: Diagnosis not present

## 2019-12-09 DIAGNOSIS — I110 Hypertensive heart disease with heart failure: Secondary | ICD-10-CM | POA: Diagnosis not present

## 2019-12-09 DIAGNOSIS — M62838 Other muscle spasm: Secondary | ICD-10-CM | POA: Diagnosis not present

## 2019-12-09 DIAGNOSIS — I4811 Longstanding persistent atrial fibrillation: Secondary | ICD-10-CM | POA: Diagnosis not present

## 2019-12-09 DIAGNOSIS — G35 Multiple sclerosis: Secondary | ICD-10-CM | POA: Diagnosis not present

## 2019-12-09 NOTE — Telephone Encounter (Signed)
Order placed. Pt is aware that in order to have oxygen approved this study is needed but has declined several times on completing this due to her decline in health and the effort it takes to get out of the house. She is also wheelchair bound so getting in and out of the bed is hard. Pt wad following up with Dr. Debara Pickett to see if there was anything on cardiology side that could be done to get the oxygen approved. Dr. Brett Fairy was also planning to talk with cardiologist as well.

## 2019-12-09 NOTE — Telephone Encounter (Signed)
Dr Debara Pickett is aware, I received word he will work on Oxygen for her.

## 2019-12-10 ENCOUNTER — Telehealth: Payer: Self-pay

## 2019-12-10 DIAGNOSIS — M170 Bilateral primary osteoarthritis of knee: Secondary | ICD-10-CM | POA: Diagnosis not present

## 2019-12-10 DIAGNOSIS — I5032 Chronic diastolic (congestive) heart failure: Secondary | ICD-10-CM | POA: Diagnosis not present

## 2019-12-10 DIAGNOSIS — I110 Hypertensive heart disease with heart failure: Secondary | ICD-10-CM | POA: Diagnosis not present

## 2019-12-10 DIAGNOSIS — I4811 Longstanding persistent atrial fibrillation: Secondary | ICD-10-CM | POA: Diagnosis not present

## 2019-12-10 DIAGNOSIS — M62838 Other muscle spasm: Secondary | ICD-10-CM | POA: Diagnosis not present

## 2019-12-10 DIAGNOSIS — G35 Multiple sclerosis: Secondary | ICD-10-CM | POA: Diagnosis not present

## 2019-12-10 NOTE — Progress Notes (Signed)
TeleHealth Visit:  Due to the COVID-19 pandemic, this visit was completed with telemedicine (audio/video) technology to reduce patient and provider exposure as well as to preserve personal protective equipment.   Toni [name] has verbally consented to this TeleHealth visit. The patient is located at home, the provider is located at the [facility] office. The participants in this visit include the listed provider and patient. Toni [name] was unable to use realtime audiovisual technology today and the telehealth visit was conducted via telephone.  Chief Complaint: OBESITY Toni [name] is here to discuss her progress with her obesity treatment plan along with follow-up of her obesity related diagnoses. Toni [name] is on the Category 1 Plan and states she is following her eating plan approximately 90% of the time. Toni [name] states she is doing 0 minutes 0 times per week.  Today's visit was #: 21 Starting weight: 257 lbs Starting date: 03/08/2017  Interim History: Toni [name] feels she continues to do well with maintaining her weight loss. 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  She states her blood pressure was 120/72, pulse 76, temp of 97.8, and 98 SpO2.  Subjective:   1. Depression with anxiety Toni [name] continues to do well with decreasing emotional eating and is working on [employer]. Her mood has improved and she is stable on her medications.  Assessment/Plan:   1. Depression with anxiety Toni [name] will continue her medications as is and keep up the good work with decreasing emotiona eating, and will continue to follow up.  2. Class 1 obesity with serious comorbidity and body mass index (BMI) of 34.0 to 34.9 in adult, unspecified obesity type Toni [name] is currently in the action stage of change. As such, her goal is to continue with weight loss efforts. She has  agreed to the Category 1 Plan.   Toni [name] agreed to come back to the clinic when the expansion is finished and we will be able to weigh her on the wheelchair enabled bariatric scale.  Exercise goals: Toni [name] is to do chair exercises as tolerated.  Behavioral modification strategies: increasing water intake.  Toni [name] has agreed to follow-up with our clinic in 4 weeks. She was informed of the importance of frequent follow-up visits to maximize her success with intensive lifestyle modifications for her multiple health conditions.  Objective:   VITALS: Per patient if applicable, see vitals. GENERAL: Alert and in no acute distress. CARDIOPULMONARY: No increased WOB. Speaking in clear sentences.  PSYCH: Pleasant and cooperative. Speech normal rate and rhythm. Affect is appropriate. Insight and judgement are appropriate. Attention is focused, linear, and appropriate.  NEURO: Oriented as arrived to appointment on time with no prompting.   Lab Results  Component Value Date   CREATININE 0.35 (L) 10/17/2019   BUN 23 10/17/2019   NA 139 10/17/2019   K 3.7 10/17/2019   CL 98 10/17/2019   CO2 32 10/17/2019   Lab Results  Component Value Date   ALT 17 10/16/2019   AST 19 10/16/2019   ALKPHOS 49 10/16/2019   BILITOT 0.6 10/16/2019   Lab Results  Component Value Date   HGBA1C 5.0 03/19/2019   HGBA1C 5.3 09/11/2018   HGBA1C 5.3 04/23/2018   HGBA1C 5.2 10/01/2017   HGBA1C 5.4 03/08/2017   Lab Results  Component Value Date   INSULIN 7.1 03/19/2019   INSULIN 13.0 09/11/2018   INSULIN 7.8 04/23/2018   INSULIN 20.4 11/27/2017  INSULIN 20.4 03/08/2017   Lab Results  Component Value Date   TSH 1.374 10/10/2019   Lab Results  Component Value Date   CHOL 111 03/19/2019   HDL 57 03/19/2019   LDLCALC 43 03/19/2019   TRIG 53 03/19/2019   CHOLHDL 3.0 04/22/2010   Lab Results  Component Value Date   WBC 7.2 10/17/2019   HGB 12.2 10/17/2019   HCT 37.0 10/17/2019   MCV 94.4  10/17/2019   PLT 201 10/17/2019   Lab Results  Component Value Date   FERRITIN 159 08/27/2019    Attestation Statements:   Reviewed by clinician on day of visit: allergies, medications, problem list, medical history, surgical history, family history, social history, and previous encounter notes.  Time spent on visit including pre-visit chart review and post-visit charting and care was 25 minutes.    I, Trixie Dredge, am acting as transcriptionist for Dennard Nip, MD.  I have reviewed the above documentation for accuracy and completeness, and I agree with the above. - Dennard Nip, MD

## 2019-12-10 NOTE — Telephone Encounter (Signed)
Pt is refusing to schedule in lab BiPAP sleep study. Pt stated that her declining health prevents her from coming into the sleep lab. She stated that she is following up with her cardiologist to see if he can get her oxygen approved.

## 2019-12-15 ENCOUNTER — Ambulatory Visit (INDEPENDENT_AMBULATORY_CARE_PROVIDER_SITE_OTHER): Payer: Medicare Other | Admitting: Pharmacist Clinician (PhC)/ Clinical Pharmacy Specialist

## 2019-12-15 DIAGNOSIS — G35 Multiple sclerosis: Secondary | ICD-10-CM | POA: Diagnosis not present

## 2019-12-15 DIAGNOSIS — I4811 Longstanding persistent atrial fibrillation: Secondary | ICD-10-CM | POA: Diagnosis not present

## 2019-12-15 DIAGNOSIS — Z7901 Long term (current) use of anticoagulants: Secondary | ICD-10-CM | POA: Diagnosis not present

## 2019-12-15 DIAGNOSIS — I110 Hypertensive heart disease with heart failure: Secondary | ICD-10-CM | POA: Diagnosis not present

## 2019-12-15 DIAGNOSIS — M62838 Other muscle spasm: Secondary | ICD-10-CM | POA: Diagnosis not present

## 2019-12-15 DIAGNOSIS — M170 Bilateral primary osteoarthritis of knee: Secondary | ICD-10-CM | POA: Diagnosis not present

## 2019-12-15 DIAGNOSIS — I5032 Chronic diastolic (congestive) heart failure: Secondary | ICD-10-CM | POA: Diagnosis not present

## 2019-12-15 DIAGNOSIS — I4891 Unspecified atrial fibrillation: Secondary | ICD-10-CM

## 2019-12-15 LAB — POCT INR: INR: 1.7 — AB (ref 2.0–3.0)

## 2019-12-16 DIAGNOSIS — I110 Hypertensive heart disease with heart failure: Secondary | ICD-10-CM | POA: Diagnosis not present

## 2019-12-16 DIAGNOSIS — G35 Multiple sclerosis: Secondary | ICD-10-CM | POA: Diagnosis not present

## 2019-12-16 DIAGNOSIS — M170 Bilateral primary osteoarthritis of knee: Secondary | ICD-10-CM | POA: Diagnosis not present

## 2019-12-16 DIAGNOSIS — I4811 Longstanding persistent atrial fibrillation: Secondary | ICD-10-CM | POA: Diagnosis not present

## 2019-12-16 DIAGNOSIS — M62838 Other muscle spasm: Secondary | ICD-10-CM | POA: Diagnosis not present

## 2019-12-16 DIAGNOSIS — I5032 Chronic diastolic (congestive) heart failure: Secondary | ICD-10-CM | POA: Diagnosis not present

## 2019-12-17 DIAGNOSIS — G35 Multiple sclerosis: Secondary | ICD-10-CM | POA: Diagnosis not present

## 2019-12-17 DIAGNOSIS — I5032 Chronic diastolic (congestive) heart failure: Secondary | ICD-10-CM | POA: Diagnosis not present

## 2019-12-17 DIAGNOSIS — I110 Hypertensive heart disease with heart failure: Secondary | ICD-10-CM | POA: Diagnosis not present

## 2019-12-17 DIAGNOSIS — I4811 Longstanding persistent atrial fibrillation: Secondary | ICD-10-CM | POA: Diagnosis not present

## 2019-12-17 DIAGNOSIS — M170 Bilateral primary osteoarthritis of knee: Secondary | ICD-10-CM | POA: Diagnosis not present

## 2019-12-17 DIAGNOSIS — M62838 Other muscle spasm: Secondary | ICD-10-CM | POA: Diagnosis not present

## 2019-12-18 ENCOUNTER — Encounter: Payer: Self-pay | Admitting: Neurology

## 2019-12-18 ENCOUNTER — Other Ambulatory Visit: Payer: Self-pay

## 2019-12-18 ENCOUNTER — Ambulatory Visit (INDEPENDENT_AMBULATORY_CARE_PROVIDER_SITE_OTHER): Payer: Medicare Other | Admitting: Neurology

## 2019-12-18 VITALS — BP 140/72 | HR 80 | Temp 98.3°F

## 2019-12-18 DIAGNOSIS — G35 Multiple sclerosis: Secondary | ICD-10-CM | POA: Diagnosis not present

## 2019-12-18 MED ORDER — BACLOFEN 40 MG/20ML IT SOLN
40.0000 mg | Freq: Once | INTRATHECAL | Status: AC
  Administered 2019-12-18: 80 mg via INTRATHECAL

## 2019-12-18 NOTE — Procedures (Signed)
     History:  Toni [name] is a 74 year old patient with history of multiple sclerosis with a spastic paraparesis.  The patient has a baclofen pump in place and comes in for a pump refill.  She has noted some increase in spasticity recently and wishes to have a new increase in the pump rate.  Baclofen pump refill note  The baclofen pump site was cleaned with Betadine solution. A 21-gauge needle was inserted into the pump port site. Approximately 9 cc of residual baclofen was removed, the pump indicates a 5.3 cc residual. 40 cc of replacement baclofen was placed into the pump at 2000 mcg/cc concentration.  The pump was reprogrammed for the following settings: The pump was increased to 847.4 mcg/day from 819.4 mcg/day, simple continuous rate.  The alarm volume is set at 1.5 cc. The next alarm date is March 17, 2020..  The patient tolerated the procedure well. There were no complications of the above procedure.  The [place] number is [phone] The baclofen expiration date is December 2022. The baclofen lot number is [id number].

## 2019-12-18 NOTE — Progress Notes (Signed)
Please refer to back from pump procedure note.

## 2019-12-18 NOTE — Progress Notes (Signed)
TeleHealth Visit:  Due to the COVID-19 pandemic, this visit was completed with telemedicine (audio/video) technology to reduce patient and provider exposure as well as to preserve personal protective equipment.   Toni [name] has verbally consented to this TeleHealth visit. The patient is located at home, the provider is located at the [facility] office. The participants in this visit include the listed provider and patient. [name] was unable to use realtime audiovisual technology today and the telehealth visit was conducted via telephone.  Chief Complaint: OBESITY Toni [name] is here to discuss her progress with her obesity treatment plan along with follow-up of her obesity related diagnoses. Toni [name] is on the Category 1 Plan and states she is following her eating plan approximately 50% of the time. Toni [name] states she is doing physical therapy when tolerated.  Today's visit was #: 18 Starting weight: 257 lbs Starting date: 03/08/2017  Interim History: Toni [name] feels she has done well with maintaining her weight since our last visit. She has had multiple challenges with her health including worsening multiple sclerosis, and is much more limited in her activity but she is trying to do physical therapy as tolerated. She reports a weight of 178 lbs from the hospital. She notes her blood pressure was 130/87, pulse 80, SpO2 99%, and temp at 97.5.  Subjective:   1. MS (multiple sclerosis) ([place]) Toni [name] is followed closely by her Neurologist and her mood has decreased due to her recent exacerbation. She denies suicidal ideas.  Assessment/Plan:   1. MS (multiple sclerosis) ([place]) Toni [name] was offered support and encouragement, and she was encouraged to continue with physical therapy as much as possible.  2. Class 1 obesity with serious comorbidity and body mass index (BMI) of 33.0 to 33.9 in adult, unspecified obesity type Toni [name] is currently in the action stage of  change. As such, her goal is to continue with weight loss efforts. She has agreed to the Category 1 Plan.   Exercise goals: As is.  Behavioral modification strategies: no skipping meals.  Toni [name] has agreed to follow-up with our clinic in 4 weeks with myself. She was informed of the importance of frequent follow-up visits to maximize her success with intensive lifestyle modifications for her multiple health conditions.  Objective:   VITALS: Per patient if applicable, see vitals. GENERAL: Alert and in no acute distress. CARDIOPULMONARY: No increased WOB. Speaking in clear sentences.  PSYCH: Pleasant and cooperative. Speech normal rate and rhythm. Affect is appropriate. Insight and judgement are appropriate. Attention is focused, linear, and appropriate.  NEURO: Oriented as arrived to appointment on time with no prompting.   Lab Results  Component Value Date   CREATININE 0.35 (L) 10/17/2019   BUN 23 10/17/2019   NA 139 10/17/2019   K 3.7 10/17/2019   CL 98 10/17/2019   CO2 32 10/17/2019   Lab Results  Component Value Date   ALT 17 10/16/2019   AST 19 10/16/2019   ALKPHOS 49 10/16/2019   BILITOT 0.6 10/16/2019   Lab Results  Component Value Date   HGBA1C 5.0 03/19/2019   HGBA1C 5.3 09/11/2018   HGBA1C 5.3 04/23/2018   HGBA1C 5.2 10/01/2017   HGBA1C 5.4 03/08/2017   Lab Results  Component Value Date   INSULIN 7.1 03/19/2019   INSULIN 13.0 09/11/2018   INSULIN 7.8 04/23/2018   INSULIN 20.4 11/27/2017   INSULIN 20.4 03/08/2017   Lab Results  Component Value Date   TSH 1.374 10/10/2019   Lab Results  Component Value Date  CHOL 111 03/19/2019   HDL 57 03/19/2019   LDLCALC 43 03/19/2019   TRIG 53 03/19/2019   CHOLHDL 3.0 04/22/2010   Lab Results  Component Value Date   WBC 7.2 10/17/2019   HGB 12.2 10/17/2019   HCT 37.0 10/17/2019   MCV 94.4 10/17/2019   PLT 201 10/17/2019   Lab Results  Component Value Date   FERRITIN 159 08/27/2019    Attestation  Statements:   Reviewed by clinician on day of visit: allergies, medications, problem list, medical history, surgical history, family history, social history, and previous encounter notes.  Time spent on visit including pre-visit chart review and post-visit charting and care was 45 minutes.    I, Trixie Dredge, am acting as transcriptionist for Dennard Nip, MD.  I have reviewed the above documentation for accuracy and completeness, and I agree with the above. - Dennard Nip, MD

## 2019-12-22 ENCOUNTER — Ambulatory Visit (INDEPENDENT_AMBULATORY_CARE_PROVIDER_SITE_OTHER): Payer: Medicare Other | Admitting: Cardiology

## 2019-12-22 DIAGNOSIS — Z7901 Long term (current) use of anticoagulants: Secondary | ICD-10-CM | POA: Diagnosis not present

## 2019-12-22 DIAGNOSIS — I4811 Longstanding persistent atrial fibrillation: Secondary | ICD-10-CM | POA: Diagnosis not present

## 2019-12-22 DIAGNOSIS — I4821 Permanent atrial fibrillation: Secondary | ICD-10-CM | POA: Diagnosis not present

## 2019-12-22 DIAGNOSIS — I110 Hypertensive heart disease with heart failure: Secondary | ICD-10-CM | POA: Diagnosis not present

## 2019-12-22 DIAGNOSIS — I5032 Chronic diastolic (congestive) heart failure: Secondary | ICD-10-CM | POA: Diagnosis not present

## 2019-12-22 DIAGNOSIS — G35 Multiple sclerosis: Secondary | ICD-10-CM | POA: Diagnosis not present

## 2019-12-22 DIAGNOSIS — M170 Bilateral primary osteoarthritis of knee: Secondary | ICD-10-CM | POA: Diagnosis not present

## 2019-12-22 DIAGNOSIS — M62838 Other muscle spasm: Secondary | ICD-10-CM | POA: Diagnosis not present

## 2019-12-22 LAB — POCT INR: INR: 2.1 (ref 2.0–3.0)

## 2019-12-23 DIAGNOSIS — I5032 Chronic diastolic (congestive) heart failure: Secondary | ICD-10-CM | POA: Diagnosis not present

## 2019-12-23 DIAGNOSIS — G35 Multiple sclerosis: Secondary | ICD-10-CM | POA: Diagnosis not present

## 2019-12-23 DIAGNOSIS — I110 Hypertensive heart disease with heart failure: Secondary | ICD-10-CM | POA: Diagnosis not present

## 2019-12-23 DIAGNOSIS — M170 Bilateral primary osteoarthritis of knee: Secondary | ICD-10-CM | POA: Diagnosis not present

## 2019-12-23 DIAGNOSIS — I4811 Longstanding persistent atrial fibrillation: Secondary | ICD-10-CM | POA: Diagnosis not present

## 2019-12-23 DIAGNOSIS — M62838 Other muscle spasm: Secondary | ICD-10-CM | POA: Diagnosis not present

## 2019-12-24 DIAGNOSIS — I5032 Chronic diastolic (congestive) heart failure: Secondary | ICD-10-CM | POA: Diagnosis not present

## 2019-12-24 DIAGNOSIS — M170 Bilateral primary osteoarthritis of knee: Secondary | ICD-10-CM | POA: Diagnosis not present

## 2019-12-24 DIAGNOSIS — M62838 Other muscle spasm: Secondary | ICD-10-CM | POA: Diagnosis not present

## 2019-12-24 DIAGNOSIS — I4811 Longstanding persistent atrial fibrillation: Secondary | ICD-10-CM | POA: Diagnosis not present

## 2019-12-24 DIAGNOSIS — I110 Hypertensive heart disease with heart failure: Secondary | ICD-10-CM | POA: Diagnosis not present

## 2019-12-24 DIAGNOSIS — G35 Multiple sclerosis: Secondary | ICD-10-CM | POA: Diagnosis not present

## 2019-12-29 ENCOUNTER — Ambulatory Visit (INDEPENDENT_AMBULATORY_CARE_PROVIDER_SITE_OTHER): Payer: Medicare Other | Admitting: Cardiovascular Disease

## 2019-12-29 DIAGNOSIS — G35 Multiple sclerosis: Secondary | ICD-10-CM | POA: Diagnosis not present

## 2019-12-29 DIAGNOSIS — I110 Hypertensive heart disease with heart failure: Secondary | ICD-10-CM | POA: Diagnosis not present

## 2019-12-29 DIAGNOSIS — M62838 Other muscle spasm: Secondary | ICD-10-CM | POA: Diagnosis not present

## 2019-12-29 DIAGNOSIS — I5032 Chronic diastolic (congestive) heart failure: Secondary | ICD-10-CM | POA: Diagnosis not present

## 2019-12-29 DIAGNOSIS — Z7901 Long term (current) use of anticoagulants: Secondary | ICD-10-CM | POA: Diagnosis not present

## 2019-12-29 DIAGNOSIS — M170 Bilateral primary osteoarthritis of knee: Secondary | ICD-10-CM | POA: Diagnosis not present

## 2019-12-29 DIAGNOSIS — I4811 Longstanding persistent atrial fibrillation: Secondary | ICD-10-CM | POA: Diagnosis not present

## 2019-12-29 LAB — POCT INR: INR: 2.4 (ref 2.0–3.0)

## 2019-12-30 DIAGNOSIS — I5032 Chronic diastolic (congestive) heart failure: Secondary | ICD-10-CM | POA: Diagnosis not present

## 2019-12-30 DIAGNOSIS — M62838 Other muscle spasm: Secondary | ICD-10-CM | POA: Diagnosis not present

## 2019-12-30 DIAGNOSIS — I110 Hypertensive heart disease with heart failure: Secondary | ICD-10-CM | POA: Diagnosis not present

## 2019-12-30 DIAGNOSIS — I4811 Longstanding persistent atrial fibrillation: Secondary | ICD-10-CM | POA: Diagnosis not present

## 2019-12-30 DIAGNOSIS — M170 Bilateral primary osteoarthritis of knee: Secondary | ICD-10-CM | POA: Diagnosis not present

## 2019-12-30 DIAGNOSIS — G35 Multiple sclerosis: Secondary | ICD-10-CM | POA: Diagnosis not present

## 2019-12-31 DIAGNOSIS — G35 Multiple sclerosis: Secondary | ICD-10-CM | POA: Diagnosis not present

## 2019-12-31 DIAGNOSIS — M170 Bilateral primary osteoarthritis of knee: Secondary | ICD-10-CM | POA: Diagnosis not present

## 2019-12-31 DIAGNOSIS — I110 Hypertensive heart disease with heart failure: Secondary | ICD-10-CM | POA: Diagnosis not present

## 2019-12-31 DIAGNOSIS — M62838 Other muscle spasm: Secondary | ICD-10-CM | POA: Diagnosis not present

## 2019-12-31 DIAGNOSIS — I5032 Chronic diastolic (congestive) heart failure: Secondary | ICD-10-CM | POA: Diagnosis not present

## 2019-12-31 DIAGNOSIS — I4811 Longstanding persistent atrial fibrillation: Secondary | ICD-10-CM | POA: Diagnosis not present

## 2020-01-02 ENCOUNTER — Encounter: Payer: Self-pay | Admitting: Pulmonary Disease

## 2020-01-02 ENCOUNTER — Other Ambulatory Visit: Payer: Self-pay

## 2020-01-02 ENCOUNTER — Ambulatory Visit (INDEPENDENT_AMBULATORY_CARE_PROVIDER_SITE_OTHER): Payer: Medicare Other | Admitting: Pulmonary Disease

## 2020-01-02 VITALS — BP 120/80 | HR 112 | Temp 98.2°F | Ht 62.0 in

## 2020-01-02 DIAGNOSIS — G4733 Obstructive sleep apnea (adult) (pediatric): Secondary | ICD-10-CM | POA: Diagnosis not present

## 2020-01-02 NOTE — Progress Notes (Addendum)
Toni [name]    MB:[id number]    Feb 21, 1946  Primary Care Physician:Avva, Ravisankar, MD  Referring Physician: Prince Solian, MD [address] [place],  [place] [address]  Chief complaint:   Patient with a history of obstructive and central sleep apnea  HPI:  Noted to have more apneas on recent review  Diagnosed  Previou recently  Was also recently tr Covid-around end of December/January  She does have a history of multiple sclerosis  Outpatient Encounter Medications as of 01/02/2020  Medication Sig  . acetaminophen (TYLENOL) 500 MG tablet Take 500-1,000 mg by mouth every 6 (six) hours as needed for fever or headache (or pain).  Marland Kitchen ALPRAZolam (XANAX) 0.5 MG tablet Take 1 tablet (0.5 mg total) by mouth 2 (two) times daily as needed for anxiety.  . Ascorbic Acid (VITAMIN C) 500 MG CAPS Take 500 mg by mouth daily.  . baclofen (LIORESAL) 20 MG tablet Once to two tablets a day for breakthrough spasms (Patient taking differently: Take 20 mg by mouth 2 (two) times daily as needed for muscle spasms. )  . buPROPion (WELLBUTRIN XL) 300 MG 24 hr tablet Take 1 tablet (300 mg total) by mouth daily.  . Calcium Carbonate-Vit D-Min (CALTRATE 600+D PLUS MINERALS) 600-800 MG-UNIT TABS Take 2 tablets by mouth 2 (two) times daily.  . cetirizine (ZYRTEC) 10 MG tablet Take 10 mg by mouth daily as needed for allergies.   . Cholecalciferol (VITAMIN D) 2000 units tablet Take 1 tablet (2,000 Units total) by mouth 2 (two) times daily.  . Coenzyme Q10 (CO Q-10 PO) Take 1 tablet by mouth daily.  . corticotropin (ACTHAR) 80 UNIT/ML injectable gel Inject 1 mL (80 Units total) into the muscle every other day. For 10 days each month.  . Cranberry 1000 MG CAPS Take 2 capsules by mouth 2 (two) times daily.   Marland Kitchen docusate sodium (COLACE) 100 MG capsule Take 1 capsule (100 mg total) by mouth daily.  . famotidine (PEPCID) 20 MG tablet Take 20 mg by mouth at bedtime.   . fluconazole (DIFLUCAN) 150 MG  tablet Take 150 mg by mouth every 3 (three) days.  . fluticasone (FLONASE) 50 MCG/ACT nasal spray Place 2 sprays into both nostrils daily as needed for allergies.   . furosemide (LASIX) 20 MG tablet Take 1 tablet by mouth daily, may take second dose if needed for excess edema  . gabapentin (NEURONTIN) 300 MG capsule TAKE 1 CAPSULE BY MOUTH UP  TO 4 TIMES DAILY (Patient taking differently: Take 300 mg by mouth 4 (four) times daily. )  . GENERLAC 10 GM/15ML SOLN TAKE 15 ML BY MOUTH TWICE DAILY AS NEEDED (Patient taking differently: Take 10 g by mouth 2 (two) times daily as needed (constipation). )  . levothyroxine (SYNTHROID, LEVOTHROID) 75 MCG tablet Take 75 mcg by mouth daily.  . melatonin 3 MG TABS tablet Take 3 mg by mouth at bedtime.  . Omega-3 Fatty Acids (FISH OIL PO) Take 1 capsule by mouth daily.  Marland Kitchen omeprazole (PRILOSEC) 20 MG capsule Take 20 mg by mouth daily.   . ondansetron (ZOFRAN) 4 MG tablet Take 4-8 mg by mouth every 8 (eight) hours as needed for nausea or vomiting.   . psyllium (METAMUCIL SMOOTH TEXTURE) 28 % packet Take 1 packet by mouth daily.   . simvastatin (ZOCOR) 10 MG tablet Take 10 mg by mouth every evening.   Marland Kitchen SYRINGE/NEEDLE, DISP, 1 ML (BD LUER-LOK SYRINGE) 25G X 5/8" 1  ML MISC Use as directed to inject  Acthar  . Tamsulosin HCl (FLOMAX) 0.4 MG CAPS Take 0.4 mg by mouth daily as needed (bladder emptying).   . warfarin (COUMADIN) 5 MG tablet Take 1/2 to 1 tablet by mouth daily as directed by coumadin clinic.  Marland Kitchen diltiazem (CARDIZEM CD) 180 MG 24 hr capsule Take 1 capsule (180 mg total) by mouth daily.  Marland Kitchen diltiazem (CARDIZEM) 60 MG tablet Take 1 tablet (60 mg total) by mouth every 6 (six) hours as needed for up to 7 days (HR sustained >110bpm).  . midodrine (PROAMATINE) 5 MG tablet Take 1 tablet (5 mg total) by mouth 3 (three) times daily with meals. (Patient not taking: Reported on 01/02/2020)   No facility-administered encounter medications on file as of 01/02/2020.     Allergies as of 01/02/2020 - Review Complete 01/02/2020  Allergen Reaction Noted  . Chlorhexidine Hives 10/10/2019  . Hydrocodone Nausea And Vomiting 11/26/2011  . Oxycodone Nausea And Vomiting 05/12/2013  . Chlorhexidine gluconate Rash 11/26/2011  . Zoloft [sertraline] Hives, Swelling, and Rash 05/15/2013    Past Medical History:  Diagnosis Date  . Abnormality of gait 03/01/2015  . Anxiety   . Arthritis    "knees" (01/19/2016)  . Atrial fibrillation with RVR ([place]) 01/19/2016  . Back pain   . Chest pain   . Childhood asthma   . Chronic pain    "nerve pain from the MS"  . Constipation   . Depression   . DVT (deep venous thrombosis) ([place]) 1983   "RLE; may have just been phlebitis; it was before the age of dopplers"  . 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 GERD (gastroesophageal reflux disease)   . History of stress test 06/21/10   limited exam with some degree of breast attenuatin of the apex however a component of apical ischemia is not excluded, EF 62%; cardiac cath   . HLD (hyperlipidemia)   . Hx of echocardiogram 04/22/10   EF 55-60%, no valve issues  . Hypertension   . Hypothyroidism   . Joint pain   . Migraine    "none since I stopped beta blocker in 1990s" (01/19/2016)  . Multiple sclerosis ([place])    has had this may 1989-[facility] reg doc  . OSA on CPAP   . Osteoarthritis   . PAF (paroxysmal atrial fibrillation) ([place])    on coumadin; documented on monitor 06/2010  . Pneumonia 11/2011  . Shortness of breath   . Thyroid disease     Past Surgical History:  Procedure Laterality Date  . ANTERIOR CERVICAL DECOMP/DISCECTOMY FUSION  01/2004  . BACK SURGERY    . CARDIAC CATHETERIZATION  06/22/2010   normal coronary arteries, PAF  . CATARACT EXTRACTION, BILATERAL Bilateral   . CHILD BIRTHS     X2  . DILATION AND CURETTAGE OF UTERUS    . INFUSION PUMP IMPLANTATION  ~ 2009   baclofen infusion in lower abd  . PAIN  PUMP REVISION N/A 07/29/2014   Procedure: Baclofen pump replacement;  Surgeon: Erline Levine, MD;  Location: [facility];  Service: Neurosurgery;  Laterality: N/A;  Baclofen pump replacement  . TONSILLECTOMY AND ADENOIDECTOMY    . VARICOSE VEIN SURGERY  ~ 1968  . VENOUS ABLATION  12/16/2010   radiofreq ablation -Dr Elisabeth Cara and [facility]  . WISDOM TOOTH EXTRACTION      Family History  Problem Relation Age of Onset  .  Coronary artery disease Father        at age 11  . Hyperlipidemia Father   . Thyroid disease Father   . Coronary artery disease Maternal Grandmother   . Depression Maternal Grandmother   . Cancer Paternal Grandmother   . Depression Mother   . Sudden death Mother   . Anxiety disorder Mother   . Alcoholism Son     Social History   Socioeconomic History  . Marital status: Married    Spouse name: [name]  . Number of children: 2  . Years of education: COLLEGE  . Highest education level: Not on file  Occupational History  . Occupation: RETIRED  Tobacco Use  . Smoking status: Former Smoker    Packs/day: 0.50    Years: 7.00    Pack years: 3.50    Types: Cigarettes    Quit date: 08/21/1976    Years since quitting: 43.3  . Smokeless tobacco: Never Used  Substance and Sexual Activity  . Alcohol use: Yes    Alcohol/week: 1.0 standard drinks    Types: 1 Glasses of wine per week  . Drug use: No  . Sexual activity: Not Currently  Other Topics Concern  . Not on file  Social History Narrative   Lives in [place] with Husband [name] [phone], [phone]   Currently in rehab   Social Determinants of Health   Financial Resource Strain:   . Difficulty of Paying Living Expenses:   Food Insecurity:   . Worried About Charity fundraiser in the Last Year:   . Arboriculturist in the Last Year:   Transportation Needs:   . Film/video editor (Medical):   Marland Kitchen Lack of Transportation (Non-Medical):   Physical Activity:   . Days of Exercise per Week:   . Minutes of Exercise per  Session:   Stress:   . Feeling of Stress :   Social Connections:   . Frequency of Communication with Friends and Family:   . Frequency of Social Gatherings with Friends and Family:   . Attends Religious Services:   . Active Member of Clubs or Organizations:   . Attends Archivist Meetings:   Marland Kitchen Marital Status:   Intimate Partner Violence:   . Fear of Current or Ex-Partner:   . Emotionally Abused:   Marland Kitchen Physically Abused:   . Sexually Abused:     Review of Systems  Constitutional: Positive for fatigue.  HENT: Negative.   Respiratory: Positive for apnea. Negative for cough and shortness of breath.   Cardiovascular: Negative.   Psychiatric/Behavioral: Positive for sleep disturbance.    Vitals:   01/02/20 1358  BP: 120/80  Pulse: (!) 112  Temp: 98.2 F (36.8 C)  SpO2: 93%     Physical Exam  Constitutional: She appears well-developed and well-nourished.  HENT:  Head: Normocephalic and atraumatic.  Eyes: Right eye exhibits no discharge. Left eye exhibits no discharge.  Neck: No tracheal deviation present. No thyromegaly present.  Cardiovascular: Normal rate.  Irregular rhythm  Pulmonary/Chest: Effort normal and breath sounds normal. No respiratory distress. She has no wheezes. She has no rales. She exhibits no tenderness.  Musculoskeletal:        General: No edema. Normal range of motion.  Neurological: She is alert.  Skin: Skin is warm.  Psychiatric: She has a normal mood and affect.   No flowsheet data found. Epworth sleepiness scale of 10  Data Reviewed: Sleep study sleep study from 08/12/2018 shows complex sleep apnea Oxygen  level improved on that study Fragmented sleep  Assessment:  History of central sleep apnea Complex sleep apnea on the most recent study -Patient has lost significant weight since previous study  Recent study showing nocturnal hypoxemia with concerns regarding patient needing oxygen supplementation with BiPAP  She does not feel  she will be able to tolerate sleeping in the sleep lab  Plan/Recommendations: Will obtain most recent oximetry-this was performed on BiPAP. -She stated she has not been using BiPAP recently  We will obtain oximetry on room air Depending on findings, will start oxygen supplementation and repeat an oximetry on supplementation to ascertain adequacy of oxygen supplementation  Will contact neurology office to obtain information  Will contact medical supply company as well to try and obtain a recent download regarding patient's BiPAP   Encouraged to call with any significant concerns  Sherrilyn Rist MD  Pulmonary and Critical Care 01/02/2020, 2:32 PM  CC: Prince Solian, MD

## 2020-01-02 NOTE — Patient Instructions (Signed)
We will try and obtain a download from your machine  Contact Dr. Edwena Felty office for download/ information about apneas  Request for an overnight oximetry on room air  Tentatively return appointment in 4 weeks  Once we get an oximetry, we will prescribe oxygen supplementation and repeat an oximetry on the new oxygen supplementation level to ensure it is adequate

## 2020-01-05 ENCOUNTER — Ambulatory Visit (INDEPENDENT_AMBULATORY_CARE_PROVIDER_SITE_OTHER): Payer: Medicare Other | Admitting: Cardiovascular Disease

## 2020-01-05 DIAGNOSIS — M62838 Other muscle spasm: Secondary | ICD-10-CM | POA: Diagnosis not present

## 2020-01-05 DIAGNOSIS — I110 Hypertensive heart disease with heart failure: Secondary | ICD-10-CM | POA: Diagnosis not present

## 2020-01-05 DIAGNOSIS — I5032 Chronic diastolic (congestive) heart failure: Secondary | ICD-10-CM | POA: Diagnosis not present

## 2020-01-05 DIAGNOSIS — I4811 Longstanding persistent atrial fibrillation: Secondary | ICD-10-CM | POA: Diagnosis not present

## 2020-01-05 DIAGNOSIS — Z7901 Long term (current) use of anticoagulants: Secondary | ICD-10-CM

## 2020-01-05 DIAGNOSIS — M170 Bilateral primary osteoarthritis of knee: Secondary | ICD-10-CM | POA: Diagnosis not present

## 2020-01-05 DIAGNOSIS — G35 Multiple sclerosis: Secondary | ICD-10-CM | POA: Diagnosis not present

## 2020-01-05 LAB — POCT INR: INR: 4.8 — AB (ref 2.0–3.0)

## 2020-01-06 DIAGNOSIS — G43909 Migraine, unspecified, not intractable, without status migrainosus: Secondary | ICD-10-CM | POA: Diagnosis not present

## 2020-01-06 DIAGNOSIS — G4733 Obstructive sleep apnea (adult) (pediatric): Secondary | ICD-10-CM | POA: Diagnosis not present

## 2020-01-06 DIAGNOSIS — E785 Hyperlipidemia, unspecified: Secondary | ICD-10-CM | POA: Diagnosis not present

## 2020-01-06 DIAGNOSIS — I110 Hypertensive heart disease with heart failure: Secondary | ICD-10-CM | POA: Diagnosis not present

## 2020-01-06 DIAGNOSIS — K5900 Constipation, unspecified: Secondary | ICD-10-CM | POA: Diagnosis not present

## 2020-01-06 DIAGNOSIS — M79672 Pain in left foot: Secondary | ICD-10-CM | POA: Diagnosis not present

## 2020-01-06 DIAGNOSIS — Z79899 Other long term (current) drug therapy: Secondary | ICD-10-CM | POA: Diagnosis not present

## 2020-01-06 DIAGNOSIS — Z8701 Personal history of pneumonia (recurrent): Secondary | ICD-10-CM | POA: Diagnosis not present

## 2020-01-06 DIAGNOSIS — G8929 Other chronic pain: Secondary | ICD-10-CM | POA: Diagnosis not present

## 2020-01-06 DIAGNOSIS — M62838 Other muscle spasm: Secondary | ICD-10-CM | POA: Diagnosis not present

## 2020-01-06 DIAGNOSIS — E039 Hypothyroidism, unspecified: Secondary | ICD-10-CM | POA: Diagnosis not present

## 2020-01-06 DIAGNOSIS — I951 Orthostatic hypotension: Secondary | ICD-10-CM | POA: Diagnosis not present

## 2020-01-06 DIAGNOSIS — K219 Gastro-esophageal reflux disease without esophagitis: Secondary | ICD-10-CM | POA: Diagnosis not present

## 2020-01-06 DIAGNOSIS — N3289 Other specified disorders of bladder: Secondary | ICD-10-CM | POA: Diagnosis not present

## 2020-01-06 DIAGNOSIS — N390 Urinary tract infection, site not specified: Secondary | ICD-10-CM | POA: Diagnosis not present

## 2020-01-06 DIAGNOSIS — I4891 Unspecified atrial fibrillation: Secondary | ICD-10-CM | POA: Diagnosis not present

## 2020-01-06 DIAGNOSIS — J45909 Unspecified asthma, uncomplicated: Secondary | ICD-10-CM | POA: Diagnosis not present

## 2020-01-06 DIAGNOSIS — R8290 Unspecified abnormal findings in urine: Secondary | ICD-10-CM | POA: Diagnosis not present

## 2020-01-06 DIAGNOSIS — I5032 Chronic diastolic (congestive) heart failure: Secondary | ICD-10-CM | POA: Diagnosis not present

## 2020-01-06 DIAGNOSIS — I519 Heart disease, unspecified: Secondary | ICD-10-CM | POA: Diagnosis not present

## 2020-01-06 DIAGNOSIS — C642 Malignant neoplasm of left kidney, except renal pelvis: Secondary | ICD-10-CM | POA: Diagnosis not present

## 2020-01-06 DIAGNOSIS — I4811 Longstanding persistent atrial fibrillation: Secondary | ICD-10-CM | POA: Diagnosis not present

## 2020-01-06 DIAGNOSIS — F329 Major depressive disorder, single episode, unspecified: Secondary | ICD-10-CM | POA: Diagnosis not present

## 2020-01-06 DIAGNOSIS — I959 Hypotension, unspecified: Secondary | ICD-10-CM | POA: Diagnosis not present

## 2020-01-06 DIAGNOSIS — M549 Dorsalgia, unspecified: Secondary | ICD-10-CM | POA: Diagnosis not present

## 2020-01-06 DIAGNOSIS — Z5181 Encounter for therapeutic drug level monitoring: Secondary | ICD-10-CM | POA: Diagnosis not present

## 2020-01-06 DIAGNOSIS — I8393 Asymptomatic varicose veins of bilateral lower extremities: Secondary | ICD-10-CM | POA: Diagnosis not present

## 2020-01-06 DIAGNOSIS — F419 Anxiety disorder, unspecified: Secondary | ICD-10-CM | POA: Diagnosis not present

## 2020-01-06 DIAGNOSIS — G35 Multiple sclerosis: Secondary | ICD-10-CM | POA: Diagnosis not present

## 2020-01-06 DIAGNOSIS — M170 Bilateral primary osteoarthritis of knee: Secondary | ICD-10-CM | POA: Diagnosis not present

## 2020-01-06 DIAGNOSIS — E669 Obesity, unspecified: Secondary | ICD-10-CM | POA: Diagnosis not present

## 2020-01-06 DIAGNOSIS — Z7901 Long term (current) use of anticoagulants: Secondary | ICD-10-CM | POA: Diagnosis not present

## 2020-01-06 DIAGNOSIS — R300 Dysuria: Secondary | ICD-10-CM | POA: Diagnosis not present

## 2020-01-06 DIAGNOSIS — Z8616 Personal history of COVID-19: Secondary | ICD-10-CM | POA: Diagnosis not present

## 2020-01-06 DIAGNOSIS — Z86718 Personal history of other venous thrombosis and embolism: Secondary | ICD-10-CM | POA: Diagnosis not present

## 2020-01-07 DIAGNOSIS — N840 Polyp of corpus uteri: Secondary | ICD-10-CM | POA: Diagnosis not present

## 2020-01-07 DIAGNOSIS — R1909 Other intra-abdominal and pelvic swelling, mass and lump: Secondary | ICD-10-CM | POA: Diagnosis not present

## 2020-01-08 ENCOUNTER — Emergency Department (HOSPITAL_COMMUNITY): Payer: Medicare Other

## 2020-01-08 ENCOUNTER — Other Ambulatory Visit: Payer: Self-pay

## 2020-01-08 ENCOUNTER — Encounter (HOSPITAL_COMMUNITY): Payer: Self-pay

## 2020-01-08 ENCOUNTER — Telehealth: Payer: Self-pay | Admitting: Pharmacist

## 2020-01-08 ENCOUNTER — Emergency Department (HOSPITAL_COMMUNITY)
Admission: EM | Admit: 2020-01-08 | Discharge: 2020-01-09 | Disposition: A | Discharge status: Home / Self Care | Payer: Medicare Other | Attending: Emergency Medicine | Admitting: Emergency Medicine

## 2020-01-08 ENCOUNTER — Telehealth: Payer: Self-pay | Admitting: Internal Medicine

## 2020-01-08 DIAGNOSIS — L03116 Cellulitis of left lower limb: Secondary | ICD-10-CM | POA: Insufficient documentation

## 2020-01-08 DIAGNOSIS — I110 Hypertensive heart disease with heart failure: Secondary | ICD-10-CM | POA: Insufficient documentation

## 2020-01-08 DIAGNOSIS — R319 Hematuria, unspecified: Secondary | ICD-10-CM | POA: Diagnosis present

## 2020-01-08 DIAGNOSIS — I5042 Chronic combined systolic (congestive) and diastolic (congestive) heart failure: Secondary | ICD-10-CM | POA: Diagnosis not present

## 2020-01-08 DIAGNOSIS — Z7901 Long term (current) use of anticoagulants: Secondary | ICD-10-CM | POA: Insufficient documentation

## 2020-01-08 DIAGNOSIS — R310 Gross hematuria: Secondary | ICD-10-CM | POA: Insufficient documentation

## 2020-01-08 DIAGNOSIS — E119 Type 2 diabetes mellitus without complications: Secondary | ICD-10-CM | POA: Insufficient documentation

## 2020-01-08 DIAGNOSIS — R339 Retention of urine, unspecified: Secondary | ICD-10-CM | POA: Insufficient documentation

## 2020-01-08 DIAGNOSIS — R58 Hemorrhage, not elsewhere classified: Secondary | ICD-10-CM | POA: Diagnosis not present

## 2020-01-08 DIAGNOSIS — Z79899 Other long term (current) drug therapy: Secondary | ICD-10-CM | POA: Diagnosis not present

## 2020-01-08 DIAGNOSIS — Z87891 Personal history of nicotine dependence: Secondary | ICD-10-CM | POA: Insufficient documentation

## 2020-01-08 DIAGNOSIS — M7732 Calcaneal spur, left foot: Secondary | ICD-10-CM | POA: Diagnosis not present

## 2020-01-08 DIAGNOSIS — K5900 Constipation, unspecified: Secondary | ICD-10-CM | POA: Diagnosis not present

## 2020-01-08 DIAGNOSIS — M7989 Other specified soft tissue disorders: Secondary | ICD-10-CM | POA: Diagnosis not present

## 2020-01-08 LAB — BASIC METABOLIC PANEL
Anion gap: 10 (ref 5–15)
BUN: 26 mg/dL — ABNORMAL HIGH (ref 8–23)
CO2: 30 mmol/L (ref 22–32)
Calcium: 9 mg/dL (ref 8.9–10.3)
Chloride: 100 mmol/L (ref 98–111)
Creatinine, Ser: 0.74 mg/dL (ref 0.44–1.00)
GFR calc Af Amer: >60 mL/min (ref >60)
GFR calc non Af Amer: >60 mL/min (ref >60)
Glucose, Bld: 91 mg/dL (ref 70–99)
Potassium: 4.8 mmol/L (ref 3.5–5.1)
Sodium: 140 mmol/L (ref 135–145)

## 2020-01-08 LAB — CBC WITH DIFFERENTIAL/PLATELET
Abs Immature Granulocytes: 0.02 10*3/uL (ref 0.00–0.07)
Basophils Absolute: 0 10*3/uL (ref 0.0–0.1)
Basophils Relative: 0 %
Eosinophils Absolute: 0.3 10*3/uL (ref 0.0–0.5)
Eosinophils Relative: 3 %
HCT: 49.6 % — ABNORMAL HIGH (ref 36.0–46.0)
Hemoglobin: 16.6 g/dL — ABNORMAL HIGH (ref 12.0–15.0)
Immature Granulocytes: 0 %
Lymphocytes Relative: 18 %
Lymphs Abs: 2.2 10*3/uL (ref 0.7–4.0)
MCH: 31.3 pg (ref 26.0–34.0)
MCHC: 33.5 g/dL (ref 30.0–36.0)
MCV: 93.6 fL (ref 80.0–100.0)
Monocytes Absolute: 1.6 10*3/uL — ABNORMAL HIGH (ref 0.1–1.0)
Monocytes Relative: 14 %
Neutro Abs: 7.7 10*3/uL (ref 1.7–7.7)
Neutrophils Relative %: 65 %
Platelets: 223 10*3/uL (ref 150–400)
RBC: 5.3 MIL/uL — ABNORMAL HIGH (ref 3.87–5.11)
RDW: 12.1 % (ref 11.5–15.5)
WBC: 11.7 10*3/uL — ABNORMAL HIGH (ref 4.0–10.5)
nRBC: 0 % (ref 0.0–0.2)

## 2020-01-08 LAB — PROTIME-INR
INR: 3.9 — ABNORMAL HIGH (ref 0.8–1.2)
Prothrombin Time: 37.3 seconds — ABNORMAL HIGH (ref 11.4–15.2)

## 2020-01-08 MED ORDER — ONDANSETRON HCL 4 MG/2ML IJ SOLN
4.0000 mg | Freq: Once | INTRAMUSCULAR | Status: AC
  Administered 2020-01-08: 4 mg via INTRAVENOUS
  Filled 2020-01-08: qty 2

## 2020-01-08 MED ORDER — SODIUM CHLORIDE 0.9 % IV SOLN
1.0000 g | Freq: Once | INTRAVENOUS | Status: AC
  Administered 2020-01-08: 1 g via INTRAVENOUS
  Filled 2020-01-08: qty 10

## 2020-01-08 MED ORDER — FLEET ENEMA 7-19 GM/118ML RE ENEM
1.0000 | ENEMA | Freq: Once | RECTAL | Status: AC
  Administered 2020-01-08: 1 via RECTAL
  Filled 2020-01-08: qty 1

## 2020-01-08 MED ORDER — MORPHINE SULFATE (PF) 4 MG/ML IV SOLN
4.0000 mg | Freq: Once | INTRAVENOUS | Status: AC
  Administered 2020-01-08: 4 mg via INTRAVENOUS
  Filled 2020-01-08: qty 1

## 2020-01-08 NOTE — Telephone Encounter (Signed)
Toni [name] called, having a procedure on 01/15/20 with Dr Radene Knee with [facility].  Called office and left voicemail with Nira Conn in scheduling requesting clearance form due to patient being on warfarin.

## 2020-01-08 NOTE — Discharge Instructions (Addendum)
Hold coumadin tonight and tomorrow night.

## 2020-01-08 NOTE — ED Provider Notes (Signed)
[facility] DEPT Provider Note   CSN: [id number] Arrival date & time: 01/08/20  2001     History Chief Complaint  Patient presents with  . Hematuria    Toni [name] is a 74 y.o. female.  Pt presents to the ED today with hematuria.  Pt saw her ObGyn (Dr. Radene Knee) who placed a foley catheter today due to urinary retention.  Pt said 800 cc of urine returned almost immediately.  Pt is on coumadin and had some blood in her urine initially.  Since her appointment today, she has had frank blood.  Urine is still draining.  She does have some lower abdominal pain.  As a different issue, she has redness and swelling to her left foot.  Pt also c/o constipation.        Past Medical History:  Diagnosis Date  . Abnormality of gait 03/01/2015  . Anxiety   . Arthritis    "knees" (01/19/2016)  . Atrial fibrillation with RVR ([place]) 01/19/2016  . Back pain   . Chest pain   . Childhood asthma   . Chronic pain    "nerve pain from the MS"  . Constipation   . Depression   . DVT (deep venous thrombosis) ([place]) 1983   "RLE; may have just been phlebitis; it was before the age of dopplers"  . 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 GERD (gastroesophageal reflux disease)   . History of stress test 06/21/10   limited exam with some degree of breast attenuatin of the apex however a component of apical ischemia is not excluded, EF 62%; cardiac cath   . HLD (hyperlipidemia)   . Hx of echocardiogram 04/22/10   EF 55-60%, no valve issues  . Hypertension   . Hypothyroidism   . Joint pain   . Migraine    "none since I stopped beta blocker in 1990s" (01/19/2016)  . Multiple sclerosis ([place])    has had this may 1989-[facility] reg doc  . OSA on CPAP   . Osteoarthritis   . PAF (paroxysmal atrial fibrillation) ([place])    on coumadin; documented on monitor 06/2010  . Pneumonia 11/2011  . Shortness of breath   . Thyroid disease      Patient Active Problem List   Diagnosis Date Noted  . 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 Acute respiratory failure with hypoxia ([place]) 10/10/2019  . Severe sepsis ([place]) 10/10/2019  . SOB (shortness of breath) 10/10/2019  . HCAP (healthcare-associated pneumonia) 10/09/2019  . COVID-19 virus infection 08/23/2019  . Constipation   . Bilateral leg weakness 08/04/2019  . Weakness 08/04/2019  . Unilateral primary osteoarthritis, left knee 01/09/2019  . Type 2 diabetes mellitus without complication, without long-term current use of insulin ([place]) 08/27/2018  . Obstructive sleep apnea treated with bilevel positive airway pressure (BiPAP) 08/15/2018  . Sleep apnea treated with nocturnal BiPAP 04/23/2018  . 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 ([place]) 11/21/2017  . Permanent atrial fibrillation ([place]) 11/21/2017  . Tightness of leg fascia 11/21/2017  . Muscle spasm of both lower legs 11/21/2017  . Carpal tunnel syndrome, left upper limb 09/13/2017  . Carpal tunnel syndrome, right upper limb 09/13/2017  . Other insomnia 06/11/2017  . Depression 06/11/2017  . Abdominal swelling 03/29/2017  . Central apnea 03/26/2017  . Class 3 obesity with serious comorbidity and body mass index (BMI) of 40.0 to 44.9 in  adult 03/22/2017  . Vitamin D deficiency 03/22/2017  . Prediabetes 03/22/2017  . Venous insufficiency 02/14/2017  . Right hand weakness 08/06/2016  . Nonischemic cardiomyopathy ([place]) 05/09/2016  . Localized edema 05/09/2016  . Long term current use of anticoagulant therapy 02/03/2016  . 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 Atrial fibrillation with RVR ([place]) 01/19/2016  . 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 Obesity hypoventilation syndrome ([place]) 03/30/2015  . Abnormality of gait 03/01/2015  . Chronic constipation with overflow incontinence 01/25/2015  . 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) ([place]) 01/25/2015  . 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 Closed right ankle fracture 10/30/2013  . Leucocytosis 10/30/2013  . Varicose veins of both lower extremities with complications 0000000  . Muscle spasms of lower extremity 02/24/2013  . Long term (current) use of anticoagulants 11/05/2012  . 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 Aspiration pneumonia vs Failed outpatient Rx of CAP 11/26/2011  . Essential hypertension   . Thyroid disease   . HLD (hyperlipidemia)     Past Surgical History:  Procedure Laterality Date  . ANTERIOR CERVICAL DECOMP/DISCECTOMY FUSION  01/2004  . BACK SURGERY    . CARDIAC CATHETERIZATION  06/22/2010   normal coronary arteries, PAF  . CATARACT EXTRACTION, BILATERAL Bilateral   . CHILD BIRTHS     X2  . DILATION AND CURETTAGE OF UTERUS    . INFUSION PUMP IMPLANTATION  ~ 2009   baclofen infusion in lower abd  . PAIN PUMP REVISION N/A 07/29/2014   Procedure: Baclofen pump replacement;  Surgeon: Erline Levine, MD;  Location: [facility];  Service: Neurosurgery;  Laterality: N/A;  Baclofen pump replacement  . TONSILLECTOMY AND ADENOIDECTOMY    . VARICOSE VEIN SURGERY  ~ 1968  . VENOUS ABLATION  12/16/2010   radiofreq ablation -Dr Elisabeth Cara and [facility]  . WISDOM TOOTH EXTRACTION       OB History    Gravida  2   Para      Term      Preterm      AB      Living        SAB      TAB      Ectopic      Multiple      Live Births              Family History  Problem Relation Age of Onset  . Coronary artery disease Father        at age 95  . Hyperlipidemia Father   . Thyroid disease Father   . Coronary artery disease Maternal Grandmother   . Depression Maternal Grandmother   . Cancer Paternal Grandmother   . Depression Mother   . Sudden death Mother   . Anxiety disorder Mother    . Alcoholism Son     Social History   Tobacco Use  . Smoking status: Former Smoker    Packs/day: 0.50    Years: 7.00    Pack years: 3.50    Types: Cigarettes    Quit date: 08/21/1976    Years since quitting: 43.4  . Smokeless tobacco: Never Used  Substance Use Topics  . Alcohol use: Yes    Alcohol/week: 1.0 standard drinks    Types: 1 Glasses of wine per week  . Drug use: No    Home Medications Prior  to Admission medications   Medication Sig Start Date End Date Taking? Authorizing Provider  acetaminophen (TYLENOL) 500 MG tablet Take 500-1,000 mg by mouth every 6 (six) hours as needed for fever or headache (or pain).   Yes [provider]  ALPRAZolam (XANAX) 0.5 MG tablet Take 1 tablet (0.5 mg total) by mouth 2 (two) times daily as needed for anxiety. 12/03/19 02/01/20 Yes Dohmeier, Asencion Partridge, MD  Ascorbic Acid (VITAMIN C) 500 MG CAPS Take 500 mg by mouth daily.   Yes [provider]  baclofen (LIORESAL) 20 MG tablet Once to two tablets a day for breakthrough spasms Patient taking differently: Take 20 mg by mouth 2 (two) times daily as needed for muscle spasms.  08/27/18  Yes Dohmeier, Asencion Partridge, MD  buPROPion (WELLBUTRIN XL) 300 MG 24 hr tablet Take 1 tablet (300 mg total) by mouth daily. 08/23/17  Yes Beasley, Caren D, MD  Calcium Carbonate-Vit D-Min (CALTRATE 600+D PLUS MINERALS) 600-800 MG-UNIT TABS Take 2 tablets by mouth 2 (two) times daily.   Yes [provider]  cephALEXin (KEFLEX) 250 MG capsule Take 1 capsule by mouth every 8 (eight) hours. 01/08/20  Yes [provider]  cetirizine (ZYRTEC) 10 MG tablet Take 10 mg by mouth daily as needed for allergies.    Yes [provider]  Cholecalciferol (VITAMIN D) 2000 units tablet Take 1 tablet (2,000 Units total) by mouth 2 (two) times daily. 03/22/17  Yes Beasley, Caren D, MD  Coenzyme Q10 (CO Q-10 PO) Take 1 tablet by mouth daily.   Yes [provider]  Cranberry 1000 MG CAPS Take 2  capsules by mouth 2 (two) times daily.    Yes [provider]  diltiazem (CARDIZEM CD) 180 MG 24 hr capsule Take 1 capsule (180 mg total) by mouth daily. 10/18/19 01/08/20 Yes Dahal, Marlowe Aschoff, MD  diltiazem (CARDIZEM) 60 MG tablet Take 1 tablet (60 mg total) by mouth every 6 (six) hours as needed for up to 7 days (HR sustained >110bpm). 10/17/19 01/08/20 Yes Dahal, Marlowe Aschoff, MD  docusate sodium (COLACE) 100 MG capsule Take 1 capsule (100 mg total) by mouth daily. 08/12/16  Yes Rai, Ripudeep K, MD  famotidine (PEPCID) 20 MG tablet Take 20 mg by mouth at bedtime.  06/11/18  Yes [provider]  fluticasone (FLONASE) 50 MCG/ACT nasal spray Place 2 sprays into both nostrils daily as needed for allergies.  03/23/15  Yes [provider]  furosemide (LASIX) 20 MG tablet Take 1 tablet by mouth daily, may take second dose if needed for excess edema 09/30/19  Yes Hilty, Nadean Corwin, MD  gabapentin (NEURONTIN) 300 MG capsule TAKE 1 CAPSULE BY MOUTH UP  TO 4 TIMES DAILY Patient taking differently: Take 300 mg by mouth 4 (four) times daily.  12/31/18  Yes Dohmeier, Asencion Partridge, MD  GENERLAC 10 GM/15ML SOLN TAKE 15 ML BY MOUTH TWICE DAILY AS NEEDED Patient taking differently: Take 10 g by mouth 2 (two) times daily as needed (constipation).  06/25/19  Yes Dohmeier, Asencion Partridge, MD  levothyroxine (SYNTHROID, LEVOTHROID) 75 MCG tablet Take 75 mcg by mouth daily.   Yes [provider]  melatonin 3 MG TABS tablet Take 3 mg by mouth at bedtime.   Yes [provider]  midodrine (PROAMATINE) 5 MG tablet Take 1 tablet (5 mg total) by mouth 3 (three) times daily with meals. 09/30/19  Yes Hilty, Nadean Corwin, MD  Omega-3 Fatty Acids (FISH OIL PO) Take 1 capsule by mouth daily.   Yes [provider]  omeprazole (PRILOSEC) 20 MG capsule Take 20 mg by mouth daily.    Yes [provider]  ondansetron (ZOFRAN) 4 MG tablet Take 4-8 mg by mouth every 8 (eight) hours as needed for nausea or vomiting.     Yes [provider]  psyllium (METAMUCIL SMOOTH TEXTURE) 28 % packet Take 1 packet by mouth daily.    Yes [provider]  simvastatin (ZOCOR) 10 MG tablet Take 10 mg by mouth every evening.  03/01/18  Yes [provider]  SYRINGE/NEEDLE, DISP, 1 ML (BD LUER-LOK SYRINGE) 25G X 5/8" 1 ML MISC Use as directed to inject  Acthar 06/15/15  Yes Dohmeier, Asencion Partridge, MD  Tamsulosin HCl (FLOMAX) 0.4 MG CAPS Take 0.4 mg by mouth daily as needed (bladder emptying).    Yes [provider]  warfarin (COUMADIN) 5 MG tablet Take 1/2 to 1 tablet by mouth daily as directed by coumadin clinic. 09/24/19  Yes Hilty, Nadean Corwin, MD  corticotropin (ACTHAR) 80 UNIT/ML injectable gel Inject 1 mL (80 Units total) into the muscle every other day. For 10 days each month. 12/01/19   Dohmeier, Asencion Partridge, MD  fluconazole (DIFLUCAN) 150 MG tablet Take 150 mg by mouth every 3 (three) days. 01/01/20   [provider]    Allergies    Chlorhexidine, Hydrocodone, Oxycodone, Chlorhexidine gluconate, and Zoloft [sertraline]  Review of Systems   Review of Systems  Gastrointestinal: Positive for abdominal pain.  Genitourinary: Positive for hematuria.  Skin: Positive for rash.  All other systems reviewed and are negative.   Physical Exam Updated Vital Signs BP 131/85   Pulse 67   Temp 98 F (36.7 C) (Oral)   Resp 15   Ht 5\' 2"  (1.575 m)   Wt 77.1 kg   SpO2 98%   BMI 31.09 kg/m   Physical Exam Vitals and nursing note reviewed.  Constitutional:      Appearance: Normal appearance.  HENT:     Head: Normocephalic and atraumatic.     Right Ear: External ear normal.     Left Ear: External ear normal.     Nose: Nose normal.     Mouth/Throat:     Mouth: Mucous membranes are moist.     Pharynx: Oropharynx is clear.  Eyes:     Extraocular Movements: Extraocular movements intact.     Conjunctiva/sclera: Conjunctivae normal.     Pupils: Pupils are equal, round, and reactive to light.   Cardiovascular:     Rate and Rhythm: Normal rate and regular rhythm.     Pulses: Normal pulses.     Heart sounds: Normal heart sounds.  Pulmonary:     Effort: Pulmonary effort is normal.     Breath sounds: Normal breath sounds.  Abdominal:     General: Abdomen is flat. Bowel sounds are normal.     Palpations: Abdomen is soft.     Tenderness: There is abdominal tenderness in the suprapubic area.  Genitourinary:    Comments: Gross blood in urine Musculoskeletal:        General: Normal range of motion.     Cervical back: Normal range of motion and neck supple.     Comments: Left foot with cellulitis  Skin:    General: Skin is warm.     Capillary Refill: Capillary refill takes less than 2 seconds.  Neurological:     General: No focal deficit present.     Mental Status: She is alert and oriented to person, place, and time.  Psychiatric:        Mood and Affect: Mood normal.        Behavior: Behavior normal.        Thought Content: Thought content normal.        Judgment: Judgment normal.     ED Results / Procedures / Treatments   Labs (all labs ordered are listed, but only abnormal results are displayed) Labs Reviewed  BASIC METABOLIC PANEL - Abnormal; Notable for the following components:      Result Value   BUN 26 (*)    All other components within normal limits  CBC WITH DIFFERENTIAL/PLATELET - Abnormal; Notable for the following components:   WBC 11.7 (*)    RBC 5.30 (*)    Hemoglobin 16.6 (*)    HCT 49.6 (*)    Monocytes Absolute 1.6 (*)    All other components within normal limits  PROTIME-INR - Abnormal; Notable for the following components:   Prothrombin Time 37.3 (*)    INR 3.9 (*)    All other components within normal limits  URINE CULTURE  URINALYSIS, ROUTINE W REFLEX MICROSCOPIC    EKG None  Radiology DG Foot Complete Left  Result Date: 01/08/2020 CLINICAL DATA:  Foot redness. EXAM: LEFT FOOT - COMPLETE 3+ VIEW COMPARISON:  None. FINDINGS: There is  no acute displaced fracture. There is no dislocation. There is nonspecific soft tissue swelling about the forefoot. Degenerative changes are noted of the for first metatarsophalangeal joint. There is a moderate-sized plantar calcaneal spur. IMPRESSION: 1. No acute bony abnormality. 2. Nonspecific soft tissue swelling about the forefoot. 3. Moderate-sized plantar calcaneal spur. Electronically Signed   By: Constance Holster M.D.   On: 01/08/2020 20:46    Procedures Procedures (including critical care time)  Medications Ordered in ED Medications  sodium phosphate (FLEET) 7-19 GM/118ML enema 1 enema (has no administration in time range)  morphine 4 MG/ML injection 4 mg (4 mg Intravenous Given 01/08/20 2051)  ondansetron (ZOFRAN) injection 4 mg (4 mg Intravenous Given 01/08/20 2051)  cefTRIAXone (ROCEPHIN) 1 g in sodium chloride 0.9 % 100 mL IVPB (1 g Intravenous New Bag/Given 01/08/20 2127)    ED Course  I have reviewed the triage vital signs and the nursing notes.  Pertinent labs & imaging results that were available during my care of the patient were reviewed by me and considered in my medical decision making (see chart for details).    MDM Rules/Calculators/A&P                      Pt does have gross hematuria, but her foley is draining well.  INR is elevated at 3.9.  She is told to hold her coumadin today and tomorrow.  She does have an appointment with urology on Wed., May 26.  Pt was put on keflex today by her obgyn.  She is told to continue that as it will help her cellulitis as well.  Pt will be given an enema prior to d/c due her constipation.  Final Clinical Impression(s) / ED Diagnoses Final diagnoses:  Gross hematuria  Cellulitis of left lower extremity  Urinary retention  Constipation, unspecified constipation type    Rx / DC Orders ED Discharge Orders    None       Isla Pence, MD 01/08/20 2302

## 2020-01-08 NOTE — ED Notes (Signed)
Bladder scan revealed 0 ml in bladder.

## 2020-01-08 NOTE — Telephone Encounter (Signed)
Called patient 01/08/20 to schedule appointment from recall list but the patient already had one scheduled for 01/28/20, patient wasn't removed from recall list.

## 2020-01-08 NOTE — ED Triage Notes (Signed)
Per EMS, Pt is coming from home. Pt had a foley catheter placed today, since then gross hematuria has been collecting in her catheter bag. [facility], pt says it was placed due to pts MS causing her have difficulty peeing. Pt complaining of abdomen pain. Pt also complaining of constipation, and pain in left big toe.

## 2020-01-09 ENCOUNTER — Telehealth: Payer: Self-pay | Admitting: Pulmonary Disease

## 2020-01-09 ENCOUNTER — Telehealth: Payer: Self-pay

## 2020-01-09 DIAGNOSIS — I4891 Unspecified atrial fibrillation: Secondary | ICD-10-CM | POA: Diagnosis not present

## 2020-01-09 DIAGNOSIS — R319 Hematuria, unspecified: Secondary | ICD-10-CM | POA: Diagnosis not present

## 2020-01-09 DIAGNOSIS — Z7401 Bed confinement status: Secondary | ICD-10-CM | POA: Diagnosis not present

## 2020-01-09 DIAGNOSIS — I10 Essential (primary) hypertension: Secondary | ICD-10-CM | POA: Diagnosis not present

## 2020-01-09 DIAGNOSIS — M2550 Pain in unspecified joint: Secondary | ICD-10-CM | POA: Diagnosis not present

## 2020-01-09 LAB — URINALYSIS, ROUTINE W REFLEX MICROSCOPIC
RBC / HPF: >50 RBC/hpf — ABNORMAL HIGH (ref 0–5)
Squamous Epithelial / HPF: >50 — ABNORMAL HIGH (ref 0–5)

## 2020-01-09 LAB — URINE CULTURE: Culture: NO GROWTH

## 2020-01-09 NOTE — Telephone Encounter (Signed)
   [facility] Pre-operative Risk Assessment     Request for surgical clearance:  1. What type of surgery is being performed? Hysteroscopy  D&C   2. When is this surgery scheduled? 01/15/20   3. What type of clearance is required (medical clearance vs. Pharmacy clearance to hold med vs. Both)? Both   4. Are there any medications that need to be held prior to surgery and how long? None noted but pt is on Coumadin   5. Practice name and name of physician performing surgery? [facility] Dr. Arvella Nigh   6. What is the office phone number? [place].   What is the office fax number?  [phone]  8.   Anesthesia type (None, local, MAC, general) ?

## 2020-01-09 NOTE — Telephone Encounter (Signed)
We are still waiting on a response from Dr. Ander Slade.

## 2020-01-09 NOTE — Telephone Encounter (Signed)
Spoke with patient regarding prior message from patient's GYN Dr.Mcomb.  Patient having outpatient hysteroscopy dnc surgery on 01/15/2020 at [facility]. States needs surgical clearance. Phone number is [phone]. Fax number is [phone].   Dr.Olalere can you please advise. Thank you

## 2020-01-09 NOTE — H&P (Signed)
Patient Toni [name], Toni [name] DICTATION#[id number] CSN# [id number]  Arvella Nigh, MD 01/09/2020 6:45 AM

## 2020-01-09 NOTE — ED Notes (Signed)
[facility] called  

## 2020-01-09 NOTE — Telephone Encounter (Signed)
Pt calling to say she is not going to postpone the surgery. She feels she should be able to stay a night. Pt can reached at [phone].

## 2020-01-09 NOTE — Progress Notes (Signed)
Spoke with Konrad Felix pa and patient to be done [facility] due to complicated medical history and needs pulmonary medical clearance from dr Sherrilyn Rist, spoke with [facility] and made aware [facility] to be [facility] and heather stated dr Manon Hilding made urology referral after ed visit 01-08-2020, patient is 2 person transfer per heather at dr [facility] office.

## 2020-01-10 ENCOUNTER — Emergency Department (HOSPITAL_COMMUNITY)
Admission: EM | Admit: 2020-01-10 | Discharge: 2020-01-10 | Disposition: A | Discharge status: Home / Self Care | Payer: Medicare Other | Attending: Emergency Medicine | Admitting: Emergency Medicine

## 2020-01-10 ENCOUNTER — Encounter (HOSPITAL_COMMUNITY): Payer: Self-pay

## 2020-01-10 ENCOUNTER — Other Ambulatory Visit
Admission: RE | Admit: 2020-01-10 | Discharge: 2020-01-10 | Disposition: A | Discharge status: Home / Self Care | Payer: Medicare Other | Source: Ambulatory Visit | Attending: Emergency Medicine | Admitting: Emergency Medicine

## 2020-01-10 DIAGNOSIS — Z87891 Personal history of nicotine dependence: Secondary | ICD-10-CM | POA: Insufficient documentation

## 2020-01-10 DIAGNOSIS — L0390 Cellulitis, unspecified: Secondary | ICD-10-CM | POA: Insufficient documentation

## 2020-01-10 DIAGNOSIS — I10 Essential (primary) hypertension: Secondary | ICD-10-CM | POA: Diagnosis not present

## 2020-01-10 DIAGNOSIS — R319 Hematuria, unspecified: Secondary | ICD-10-CM | POA: Insufficient documentation

## 2020-01-10 DIAGNOSIS — Z7401 Bed confinement status: Secondary | ICD-10-CM | POA: Diagnosis not present

## 2020-01-10 DIAGNOSIS — R52 Pain, unspecified: Secondary | ICD-10-CM | POA: Diagnosis not present

## 2020-01-10 DIAGNOSIS — T83098A Other mechanical complication of other indwelling urethral catheter, initial encounter: Secondary | ICD-10-CM | POA: Diagnosis not present

## 2020-01-10 DIAGNOSIS — T839XXA Unspecified complication of genitourinary prosthetic device, implant and graft, initial encounter: Secondary | ICD-10-CM | POA: Diagnosis not present

## 2020-01-10 DIAGNOSIS — R109 Unspecified abdominal pain: Secondary | ICD-10-CM | POA: Diagnosis not present

## 2020-01-10 DIAGNOSIS — N3289 Other specified disorders of bladder: Secondary | ICD-10-CM | POA: Diagnosis not present

## 2020-01-10 DIAGNOSIS — N390 Urinary tract infection, site not specified: Secondary | ICD-10-CM | POA: Diagnosis not present

## 2020-01-10 DIAGNOSIS — Z7901 Long term (current) use of anticoagulants: Secondary | ICD-10-CM | POA: Diagnosis not present

## 2020-01-10 DIAGNOSIS — R310 Gross hematuria: Secondary | ICD-10-CM | POA: Diagnosis not present

## 2020-01-10 DIAGNOSIS — Z79899 Other long term (current) drug therapy: Secondary | ICD-10-CM | POA: Diagnosis not present

## 2020-01-10 DIAGNOSIS — R339 Retention of urine, unspecified: Secondary | ICD-10-CM | POA: Diagnosis present

## 2020-01-10 DIAGNOSIS — E039 Hypothyroidism, unspecified: Secondary | ICD-10-CM | POA: Insufficient documentation

## 2020-01-10 DIAGNOSIS — I4891 Unspecified atrial fibrillation: Secondary | ICD-10-CM | POA: Diagnosis not present

## 2020-01-10 DIAGNOSIS — M2550 Pain in unspecified joint: Secondary | ICD-10-CM | POA: Diagnosis not present

## 2020-01-10 LAB — CBC WITH DIFFERENTIAL/PLATELET
Abs Immature Granulocytes: 0.03 10*3/uL (ref 0.00–0.07)
Basophils Absolute: 0.1 10*3/uL (ref 0.0–0.1)
Basophils Relative: 1 %
Eosinophils Absolute: 0.5 10*3/uL (ref 0.0–0.5)
Eosinophils Relative: 4 %
HCT: 45.7 % (ref 36.0–46.0)
Hemoglobin: 15.4 g/dL — ABNORMAL HIGH (ref 12.0–15.0)
Immature Granulocytes: 0 %
Lymphocytes Relative: 26 %
Lymphs Abs: 2.8 10*3/uL (ref 0.7–4.0)
MCH: 31.6 pg (ref 26.0–34.0)
MCHC: 33.7 g/dL (ref 30.0–36.0)
MCV: 93.6 fL (ref 80.0–100.0)
Monocytes Absolute: 1.5 10*3/uL — ABNORMAL HIGH (ref 0.1–1.0)
Monocytes Relative: 14 %
Neutro Abs: 6 10*3/uL (ref 1.7–7.7)
Neutrophils Relative %: 55 %
Platelets: 196 10*3/uL (ref 150–400)
RBC: 4.88 MIL/uL (ref 3.87–5.11)
RDW: 11.9 % (ref 11.5–15.5)
WBC: 10.9 10*3/uL — ABNORMAL HIGH (ref 4.0–10.5)
nRBC: 0 % (ref 0.0–0.2)

## 2020-01-10 LAB — PROTIME-INR
INR: 2.8 — ABNORMAL HIGH (ref 0.8–1.2)
Prothrombin Time: 28.8 seconds — ABNORMAL HIGH (ref 11.4–15.2)

## 2020-01-10 LAB — CBC
HCT: 42.6 % (ref 36.0–46.0)
Hemoglobin: 14.8 g/dL (ref 12.0–15.0)
MCH: 31.7 pg (ref 26.0–34.0)
MCHC: 34.7 g/dL (ref 30.0–36.0)
MCV: 91.2 fL (ref 80.0–100.0)
Platelets: 194 10*3/uL (ref 150–400)
RBC: 4.67 MIL/uL (ref 3.87–5.11)
RDW: 12.2 % (ref 11.5–15.5)
WBC: 8.8 10*3/uL (ref 4.0–10.5)
nRBC: 0 % (ref 0.0–0.2)

## 2020-01-10 LAB — URINALYSIS, ROUTINE W REFLEX MICROSCOPIC

## 2020-01-10 LAB — URINALYSIS, MICROSCOPIC (REFLEX): RBC / HPF: >50 RBC/hpf (ref 0–5)

## 2020-01-10 LAB — BASIC METABOLIC PANEL
Anion gap: 7 (ref 5–15)
BUN: 26 mg/dL — ABNORMAL HIGH (ref 8–23)
CO2: 30 mmol/L (ref 22–32)
Calcium: 8.7 mg/dL — ABNORMAL LOW (ref 8.9–10.3)
Chloride: 100 mmol/L (ref 98–111)
Creatinine, Ser: 0.64 mg/dL (ref 0.44–1.00)
GFR calc Af Amer: >60 mL/min (ref >60)
GFR calc non Af Amer: >60 mL/min (ref >60)
Glucose, Bld: 102 mg/dL — ABNORMAL HIGH (ref 70–99)
Potassium: 4.3 mmol/L (ref 3.5–5.1)
Sodium: 137 mmol/L (ref 135–145)

## 2020-01-10 MED ORDER — ACETAMINOPHEN 500 MG PO TABS
1000.0000 mg | ORAL_TABLET | Freq: Once | ORAL | Status: DC
  Filled 2020-01-10: qty 2

## 2020-01-10 NOTE — ED Notes (Signed)
[facility] has been notified of need for transport. Please notify pt spouse upon transport home

## 2020-01-10 NOTE — ED Notes (Signed)
0ML on bladder scan x 3 scans

## 2020-01-10 NOTE — ED Triage Notes (Signed)
Pt BIB [facility] from home. Foley in place x 2 days. Hematuria noted. Discharged yesterday AM after similar incident. Reports last urine output was 7a. She is reporting bladder spasms and pain in her urethra. Denies flank pain at this time. No fevers. A&Ox4. Takes blood thinners for afib. Hx MS.

## 2020-01-10 NOTE — ED Provider Notes (Signed)
TIME SEEN: 2:09 AM  CHIEF COMPLAINT: Concern for urinary retention  HPI: Patient is a 74 year old female A. fib on Coumadin, hypertension, hyperlipidemia who presents to the emergency department with concerns for urinary retention.  Patient was seen by her OB/GYN Dr. Renold Don who placed a Foley catheter on 01/08/2020 due to urinary retention.  At that time she drained approximately 800 mL of urine and had some amount of blood.  She reported that blood increased over time after Foley catheter was placed.  She was started on Keflex.  She was seen in the emergency department on 01/08/2020 for gross hematuria.  Her INR at that time was 3.9 she was advised to hold her Coumadin for a couple of days.  She comes back today with a 16 French Foley catheter in place stating that she is not emptying her bladder since this afternoon.  She reports abdominal discomfort.  She states she took medication that was called in for her by her urologist for bladder spasms today.  She denies any fevers, vomiting or diarrhea.  ROS: See HPI Constitutional: no fever  Eyes: no drainage  ENT: no runny nose   Cardiovascular:  no chest pain  Resp: no SOB  GI: no vomiting GU: no dysuria Integumentary: no rash  Allergy: no hives  Musculoskeletal: no leg swelling  Neurological: no slurred speech ROS otherwise negative  PAST MEDICAL HISTORY/PAST SURGICAL HISTORY:  Past Medical History:  Diagnosis Date  . Abnormality of gait 03/01/2015  . Anxiety   . Arthritis    "knees" (01/19/2016)  . Atrial fibrillation with RVR ([place]) 01/19/2016  . Back pain   . Chest pain   . Childhood asthma   . Chronic pain    "nerve pain from the MS"  . Constipation   . Depression   . DVT (deep venous thrombosis) ([place]) 1983   "RLE; may have just been phlebitis; it was before the age of dopplers"  . 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 GERD (gastroesophageal reflux disease)   . History of  stress test 06/21/10   limited exam with some degree of breast attenuatin of the apex however a component of apical ischemia is not excluded, EF 62%; cardiac cath   . HLD (hyperlipidemia)   . Hx of echocardiogram 04/22/10   EF 55-60%, no valve issues  . Hypertension   . Hypothyroidism   . Joint pain   . Migraine    "none since I stopped beta blocker in 1990s" (01/19/2016)  . Multiple sclerosis ([place])    has had this may 1989-[facility] reg doc  . OSA on CPAP   . Osteoarthritis   . PAF (paroxysmal atrial fibrillation) ([place])    on coumadin; documented on monitor 06/2010  . Pneumonia 11/2011  . Shortness of breath   . Thyroid disease     MEDICATIONS:  Prior to Admission medications   Medication Sig Start Date End Date Taking? Authorizing Provider  acetaminophen (TYLENOL) 500 MG tablet Take 500-1,000 mg by mouth every 6 (six) hours as needed for fever or headache (or pain).    [provider]  ALPRAZolam Duanne Moron) 0.5 MG tablet Take 1 tablet (0.5 mg total) by mouth 2 (two) times daily as needed for anxiety. 12/03/19 02/01/20  Dohmeier, Asencion Partridge, MD  Ascorbic Acid (VITAMIN C) 500 MG CAPS Take 500 mg by mouth daily.    [provider]  baclofen (LIORESAL) 20 MG tablet  Once to two tablets a day for breakthrough spasms Patient taking differently: Take 20 mg by mouth 2 (two) times daily as needed for muscle spasms.  08/27/18   Dohmeier, Asencion Partridge, MD  buPROPion (WELLBUTRIN XL) 300 MG 24 hr tablet Take 1 tablet (300 mg total) by mouth daily. 08/23/17   Dennard Nip D, MD  Calcium Carbonate-Vit D-Min (CALTRATE 600+D PLUS MINERALS) 600-800 MG-UNIT TABS Take 2 tablets by mouth 2 (two) times daily.    [provider]  cephALEXin (KEFLEX) 250 MG capsule Take 1 capsule by mouth every 8 (eight) hours. 01/08/20   [provider]  cetirizine (ZYRTEC) 10 MG tablet Take 10 mg by mouth daily as needed for allergies.     [provider]  Cholecalciferol (VITAMIN D) 2000 units  tablet Take 1 tablet (2,000 Units total) by mouth 2 (two) times daily. 03/22/17   Dennard Nip D, MD  Coenzyme Q10 (CO Q-10 PO) Take 1 tablet by mouth daily.    [provider]  corticotropin (ACTHAR) 80 UNIT/ML injectable gel Inject 1 mL (80 Units total) into the muscle every other day. For 10 days each month. 12/01/19   Dohmeier, Asencion Partridge, MD  Cranberry 1000 MG CAPS Take 2 capsules by mouth 2 (two) times daily.     [provider]  diltiazem (CARDIZEM CD) 180 MG 24 hr capsule Take 1 capsule (180 mg total) by mouth daily. 10/18/19 01/08/20  Terrilee Croak, MD  diltiazem (CARDIZEM) 60 MG tablet Take 1 tablet (60 mg total) by mouth every 6 (six) hours as needed for up to 7 days (HR sustained >110bpm). 10/17/19 01/08/20  Terrilee Croak, MD  docusate sodium (COLACE) 100 MG capsule Take 1 capsule (100 mg total) by mouth daily. 08/12/16   Rai, Vernelle Emerald, MD  famotidine (PEPCID) 20 MG tablet Take 20 mg by mouth at bedtime.  06/11/18   [provider]  fluconazole (DIFLUCAN) 150 MG tablet Take 150 mg by mouth every 3 (three) days. 01/01/20   [provider]  fluticasone (FLONASE) 50 MCG/ACT nasal spray Place 2 sprays into both nostrils daily as needed for allergies.  03/23/15   [provider]  furosemide (LASIX) 20 MG tablet Take 1 tablet by mouth daily, may take second dose if needed for excess edema 09/30/19   Hilty, Nadean Corwin, MD  gabapentin (NEURONTIN) 300 MG capsule TAKE 1 CAPSULE BY MOUTH UP  TO 4 TIMES DAILY Patient taking differently: Take 300 mg by mouth 4 (four) times daily.  12/31/18   Dohmeier, Asencion Partridge, MD  GENERLAC 10 GM/15ML SOLN TAKE 15 ML BY MOUTH TWICE DAILY AS NEEDED Patient taking differently: Take 10 g by mouth 2 (two) times daily as needed (constipation).  06/25/19   Dohmeier, Asencion Partridge, MD  levothyroxine (SYNTHROID, LEVOTHROID) 75 MCG tablet Take 75 mcg by mouth daily.    [provider]  melatonin 3 MG TABS tablet Take 3 mg by mouth at bedtime.     [provider]  midodrine (PROAMATINE) 5 MG tablet Take 1 tablet (5 mg total) by mouth 3 (three) times daily with meals. 09/30/19   Hilty, Nadean Corwin, MD  Omega-3 Fatty Acids (FISH OIL PO) Take 1 capsule by mouth daily.    [provider]  omeprazole (PRILOSEC) 20 MG capsule Take 20 mg by mouth daily.     [provider]  ondansetron (ZOFRAN) 4 MG tablet Take 4-8 mg by mouth every 8 (eight) hours as needed for nausea or vomiting.  [provider]  psyllium (METAMUCIL SMOOTH TEXTURE) 28 % packet Take 1 packet by mouth daily.     [provider]  simvastatin (ZOCOR) 10 MG tablet Take 10 mg by mouth every evening.  03/01/18   [provider]  SYRINGE/NEEDLE, DISP, 1 ML (BD LUER-LOK SYRINGE) 25G X 5/8" 1 ML MISC Use as directed to inject  Acthar 06/15/15   Dohmeier, Asencion Partridge, MD  Tamsulosin HCl (FLOMAX) 0.4 MG CAPS Take 0.4 mg by mouth daily as needed (bladder emptying).     [provider]  warfarin (COUMADIN) 5 MG tablet Take 1/2 to 1 tablet by mouth daily as directed by coumadin clinic. 09/24/19   Hilty, Nadean Corwin, MD    ALLERGIES:  Allergies  Allergen Reactions  . Chlorhexidine Hives  . Hydrocodone Nausea And Vomiting    Hydrocodone causes vomiting  . Oxycodone Nausea And Vomiting    Oral oxycodone causes vomiting  . Chlorhexidine Gluconate Rash    Sores at site  . Zoloft [Sertraline] Hives, Swelling and Rash    SOCIAL HISTORY:  Social History   Tobacco Use  . Smoking status: Former Smoker    Packs/day: 0.50    Years: 7.00    Pack years: 3.50    Types: Cigarettes    Quit date: 08/21/1976    Years since quitting: 43.4  . Smokeless tobacco: Never Used  Substance Use Topics  . Alcohol use: Yes    Alcohol/week: 1.0 standard drinks    Types: 1 Glasses of wine per week    FAMILY HISTORY: Family History  Problem Relation Age of Onset  . Coronary artery disease Father        at age 73  . Hyperlipidemia Father   .  Thyroid disease Father   . Coronary artery disease Maternal Grandmother   . Depression Maternal Grandmother   . Cancer Paternal Grandmother   . Depression Mother   . Sudden death Mother   . Anxiety disorder Mother   . Alcoholism Son     EXAM: BP 123/79 (BP Location: Right Arm)   Pulse 73   Temp 98.4 F (36.9 C) (Oral)   SpO2 100%  CONSTITUTIONAL: Alert and oriented and responds appropriately to questions. Well-appearing; well-nourished elderly HEAD: Normocephalic EYES: Conjunctivae clear, pupils appear equal, EOM appear intact ENT: normal nose; moist mucous membranes NECK: Supple, normal ROM CARD: RRR; S1 and S2 appreciated; no murmurs, no clicks, no rubs, no gallops RESP: Normal chest excursion without splinting or tachypnea; breath sounds clear and equal bilaterally; no wheezes, no rhonchi, no rales, no hypoxia or respiratory distress, speaking full sentences ABD/GI: Normal bowel sounds; non-distended; soft, non-tender, no rebound, no guarding, no peritoneal signs, no hepatosplenomegaly GU: 16 French Foley catheter in place with fluid leaking around the catheter BACK:  The back appears normal EXT: Normal ROM in all joints; no deformity noted, no edema; no cyanosis, small amount of redness noted to the left distal foot that seems to be improving with 2+ DP pulse and mild soft tissue swelling no bony deformity, fluctuance or induration SKIN: Normal color for age and race; warm; no rash on exposed skin NEURO: Moves all extremities equally PSYCH: The patient's mood and manner are appropriate.   MEDICAL DECISION MAKING: Patient here stating she feels like she has urinary retention.  Nurse has BladderScan the patient and shows no urine in the bladder and I have performed a bedside ultrasound which shows no urine.  Nurse reports that she came in with a brief  on that was completely wet and urine that was leaking around the catheter.  I suspect that patient has been able to empty her bladder  today just around the Foley catheter into her brief.  We have irrigated the Foley with a liter of saline and there is blood-tinged fluid that comes back but no clots.  Patient would like the Foley catheter to be removed at this time but we have discussed that it may need to be replaced if she begins having urinary retention again.  Her abdominal exam today is benign.  Will check labs, urinalysis, urine culture.  She is on Keflex.  Will allow her to eat and drink and see if she is able to urinate on her own.  Also was seen for cellulitis of the left foot that seems to be significantly improving on Keflex.  No sign of abscess or worsening cellulitis today.  No systemic symptoms.  Afebrile here.  ED PROGRESS: Patient's labs have been reviewed/interpreted and show no significant abnormality.  INR is now 2.8.  We discussed that she can restart her Coumadin.  Urine shows blood and small amount white blood cells with only few bacteria.  She is called Keflex and have encouraged her to continue this medication.  She has been able to urinate over 200 mL in an hour on her own and reports feeling better.  No signs of urinary retention at this time.  I feel she is safe to be discharged without having Foley catheter replaced currently.  She has follow-up scheduled with her urologist and OB/GYN.  We have recommended increase fluid intake and frequent urination to avoid blood clots from forming and repeat urinary retention.  Patient and daughter at bedside comfortable with this plan.   At this time, I do not feel there is any life-threatening condition present. I have reviewed, interpreted and discussed all results (EKG, imaging, lab, urine as appropriate) and exam findings with patient/family. I have reviewed nursing notes and appropriate previous records.  I feel the patient is safe to be discharged home without further emergent workup and can continue workup as an outpatient as needed. Discussed usual and customary return  precautions. Patient/family verbalize understanding and are comfortable with this plan.  Outpatient follow-up has been provided as needed. All questions have been answered.   Toni [name] was evaluated in Emergency Department on 01/10/2020 for the symptoms described in the history of present illness. She was evaluated in the context of the global COVID-19 pandemic, which necessitated consideration that the patient might be at risk for infection with the SARS-CoV-2 virus that causes COVID-19. Institutional protocols and algorithms that pertain to the evaluation of patients at risk for COVID-19 are in a state of rapid change based on information released by regulatory bodies including the CDC and federal and state organizations. These policies and algorithms were followed during the patient's care in the ED.      Cristi Gwynn, Delice Bison, DO 01/10/20 [phone]

## 2020-01-10 NOTE — Discharge Instructions (Addendum)
I recommend close follow-up with your urologist and OB/GYN as scheduled.  Please increase your fluid intake at home and empty your bladder frequently.  If you feel you are unable to empty your bladder and begin having pain or passing clots, high fever, vomiting, please return to the emergency department.  Your labs were reassuring today.  Your INR is 2.8.  I feel it is safe to resume your Coumadin at this time.  Please continue your Keflex as prescribed.  Please take this medication until complete.

## 2020-01-11 LAB — URINE CULTURE: Culture: NO GROWTH

## 2020-01-12 ENCOUNTER — Encounter (HOSPITAL_COMMUNITY): Payer: Self-pay | Admitting: Obstetrics and Gynecology

## 2020-01-12 ENCOUNTER — Encounter (INDEPENDENT_AMBULATORY_CARE_PROVIDER_SITE_OTHER): Payer: Self-pay | Admitting: Family Medicine

## 2020-01-12 ENCOUNTER — Other Ambulatory Visit: Payer: Self-pay

## 2020-01-12 ENCOUNTER — Ambulatory Visit (INDEPENDENT_AMBULATORY_CARE_PROVIDER_SITE_OTHER): Payer: Medicare Other | Admitting: Pharmacist

## 2020-01-12 ENCOUNTER — Ambulatory Visit (INDEPENDENT_AMBULATORY_CARE_PROVIDER_SITE_OTHER): Payer: Medicare Other | Admitting: Family Medicine

## 2020-01-12 DIAGNOSIS — Z6835 Body mass index (BMI) 35.0-35.9, adult: Secondary | ICD-10-CM | POA: Diagnosis not present

## 2020-01-12 DIAGNOSIS — G35 Multiple sclerosis: Secondary | ICD-10-CM

## 2020-01-12 DIAGNOSIS — Z7901 Long term (current) use of anticoagulants: Secondary | ICD-10-CM | POA: Diagnosis not present

## 2020-01-12 DIAGNOSIS — I4821 Permanent atrial fibrillation: Secondary | ICD-10-CM

## 2020-01-12 LAB — POCT INR: INR: 1.2 — AB (ref 2.0–3.0)

## 2020-01-12 NOTE — Progress Notes (Addendum)
Anesthesia Review:  PCP: Cardiologist :dr Debara Pickett  Pt to contact DR [facility] or Dr Radene Knee regarding Coumadin instructions  Pulmonary: DR Ander Slade 01/02/20 LOV  Neuro- Dr Apolonio Schneiders 12/18/19 Clearance in 01/09/20 telephone  visit note  From pulmonary  Chest x-ray :10/14/19  EKG : 10/09/19  Echo :10/10/19  Cardiac Cath :  Sleep Study/ CPAP :Biap machine - Sleep apnea has been worse per patient since covid diagnosis 12/20-1/21. Setting increased from 9 to 34  Was supposed to have oxygen test done last week but due to current health issues has not yet been done  Patient reports sats in 90s on room air at home during day  Fasting Blood Sugar :      / Checks Blood Sugar -- times a day:   Blood Thinner/ Instructions /Last Dose: ASA / Instructions/ Last Dose :  Patient on Coumadin - PT/INR check done at home by [facility] - per pt has recently been high and low  Patient denies shortness of breath, chest pain, fever, and cough at this phone interview. Patient has issues with hypotension ( takes midodrine)  per patient Has MS in wheelchair.  Patient has hx of atrial fib.  Patient reports when blood pressure is decreased that heart rate increases.   [facility]- followed by RN, PT and OT - once- twice per week per pt  [facility] - usually once per week but recently has come daily  Patient has not had to take Midodrine recently per pt only takes if blood pressure stays below [id number] systolic for few readings  Patient has caregivers 24/7

## 2020-01-12 NOTE — H&P (Signed)
NAME: Min[place], [place] RECORD [id number] ACCOUNT [id number] DATE OF BIRTH:[birth year] FACILITY: [facility] LOCATION: [facility] PHYSICIAN:Dorian Duval Sherran Needs, MD  HISTORY AND PHYSICAL  DATE OF ADMISSION:  01/15/2020  HISTORY OF PRESENT ILLNESS:  The patient is a 74 year old gravida 2, para 2, postmenopausal patient presents for hysteroscopy.  The patient was initially referred to our office earlier last week for evaluation of vaginal discharge and continued leaking.  Of note, she was seeing Dr. Collene Mares, a urologist at that point in time.  She has a significant history of severe multiple  sclerosis.  Initial evaluation in the office revealed a large pelvic mass.  We brought her back in on Thursday.  Ultrasound revealed a large dilated bladder.  A catheter was put in place.  A saline infusion was performed that did reveal an endometrial  polyp, for which she presents for hysteroscopic resection.  She continues to have a hydrosalpinx located in the right adnexa, but with this has remained unchanged.  ALLERGIES:   1.  Chloraseptic. 2.  OXYCODONE. 3.  ZOLOFT.  MEDICATIONS: 1.  Acetaminophen. 2.  Xanax. 3.  Ascorbic acid. 4.  Wellbutrin. 5.  Keflex. 6.  Zyrtec. 7.  Vitamin D. 8.  Cardizem. 9.  Colace. 10.  Lasix. 11.  Neurontin. 12.  Levothyroxine. 13.  ProAmatine 5 mg tablets. 14.  Zofran. 15.  Coumadin 5 mg daily. 16.  Corticotropin. 17.  Fluconazole.  PAST MEDICAL HISTORY:  She has a history of severe multiple sclerosis for which she is wheelchair bound.  She has a history of deep venous thrombosis for which she remains on Coumadin, history of gastroesophageal reflux disorder.  She has hyperlipidemia,  hypertension, hypothyroidism, atrial fibrillation, type 2 diabetes, chronic obstructive pulmonary disease, sleep apnea, neurogenic bladder.  PAST SURGICAL HISTORY:   1.  She has had back surgery and fusion. 2.  Cardiac catheterization. 3.  Cataract extraction. 4.   Childbirth. 5.  Dilation and curettage. 6.  Infusion pump implantation. 7.  Tonsillectomy and adenoidectomy. 8.  Varicose vein surgery. 9.  Wisdom tooth extraction.  SOCIAL HISTORY:  Reveals a former smoker and does have 1 standard drink per day.  FAMILY HISTORY:  Noncontributory.  REVIEW OF SYSTEMS:  Noncontributory.  PHYSICAL EXAMINATION: VITAL SIGNS:  The patient is afebrile with stable vital signs. LUNGS:  Clear to auscultation and percussion. CARDIOVASCULAR:  Regular rhythm and rate.  No murmurs or gallops. ABDOMEN:  Benign.  No masses, organomegaly or tenderness. PELVIC:  She has a catheter in place.  Cervix is unremarkable.  Uterus is of normal size and shape.  Adnexa free of masses or tenderness. EXTREMITIES:  Trace edema. NEUROLOGIC:  Grossly within normal limits.  IMPRESSION: 1.  Endometrial polyp. 2.  Urinary retention due to multiple sclerosis. 3.  Continued right hydrosalpinx. 4.  Multiple sclerosis. 5.  Atrial fibrillation. 6.  Sleep apnea. 7.  Hypothyroidism. 8.  Hyperlipidemia. 9.  Hypertension. 10.  Renal cell carcinoma. 11.  History of deep venous thrombosis.  PLAN:  The patient to undergo hysteroscopy with resection of polyp.  The nature of the procedure has been discussed, the risks have been explained, including the risk of infection, the risk of hemorrhage that could require transfusion with the risk of  AIDS or hepatitis, excessive bleeding could require hysterectomy.  There is a risk of injury to adjacent organs through perforation, requiring exploratory surgery for management, risk of deep venous thrombosis and pulmonary embolus.  The patient  expressed understanding of indications and risks.  VN/NUANCE  D:01/09/2020  T:01/09/2020 JOB:[id number]

## 2020-01-12 NOTE — Progress Notes (Signed)
Patient called and LVMM for me to call her regarding preop instructions.  Patient states she has received preop instructions regarding Coumadin from the [facility] and Dr Debara Pickett.  Patient states the [facility] called her today.  Also patient wanted to know if anesthesia has seen her information and history in epic.  Nurse informed her that Anesthesia PA has chart for review.  Informed Audria Nine that patient had called and wanted to know if Anesthesia had her information for review. Also Informed PA that patient stated she has been given preop instructions for Coumadin from the [facility] as of today.

## 2020-01-12 NOTE — Telephone Encounter (Signed)
Patient has no acute preclusion to surgery from a respiratory perspective  He has sleep apnea, has not been using BiPAP There is nothing that can be changed acutely  Anesthesia needs to be aware that she has sleep apnea and-place perioperative support  There is nothing to change acutely from a respiratory perspective.we will optimize for surgery  She is cleared to go ahead with surgery, aware that she has sleep disordered breathing

## 2020-01-12 NOTE — Telephone Encounter (Signed)
Patient with diagnosis of ATRIAL FIBRILLATION on WARFARIN for anticoagulation.    Procedure: D&C Date of procedure: 01/15/2020  CHADS2-VASc score of 5  (CHF, HTN, AGE, DM2,  female)  SCR = 0.64 Platelet count = 196  Per office protocol, patient can hold WARFARIN for 5 days prior to procedure.   Patient WILL NOT need bridging with Lovenox (enoxaparin) around procedure.  * PATIENT ALREADY HOLDING WARFARIN SICEN 01/10/2020 D/T HEMATURIA WITH INR = 1.2 TODAY 5/24*   Okay to hold until procedure, then resume at previously stable dose. Next INR already scheduled for 01/21/2020

## 2020-01-13 ENCOUNTER — Telehealth: Payer: Self-pay | Admitting: Pharmacist

## 2020-01-13 ENCOUNTER — Encounter: Payer: Self-pay | Admitting: *Deleted

## 2020-01-13 NOTE — Anesthesia Preprocedure Evaluation (Addendum)
Anesthesia Evaluation  Patient identified by MRN, date of birth, ID band Patient awake    Reviewed: Allergy & Precautions, NPO status , Patient's Chart, lab work & pertinent test results  History of Anesthesia Complications Negative for: history of anesthetic complications  Airway Mallampati: III  TM Distance: >3 FB Neck ROM: Limited    Dental no notable dental hx.    Pulmonary sleep apnea and Continuous Positive Airway Pressure Ventilation , former smoker,  COVID pna in 08/2019, increased BiPAP setting since   Pulmonary exam normal        Cardiovascular +CHF  Normal cardiovascular exam+ dysrhythmias (on Coumadin) Atrial Fibrillation   Echo 10/10/2019: EF 55-60%, severe LAE, mild to moderate MR     Neuro/Psych  Headaches, Anxiety Depression MS, wheelchair bound, has baclofen pump Hx of cervical fusion 2005    GI/Hepatic Neg liver ROS, GERD  Medicated and Controlled,  Endo/Other  diabetes, Type 2Hypothyroidism   Renal/GU Hx of RCC  negative genitourinary   Musculoskeletal  (+) Arthritis ,   Abdominal   Peds  Hematology negative hematology ROS (+)   Anesthesia Other Findings Day of surgery medications reviewed with patient.  Reproductive/Obstetrics negative OB ROS                          Anesthesia Physical Anesthesia Plan  ASA: III  Anesthesia Plan: General   Post-op Pain Management:    Induction: Intravenous  PONV Risk Score and Plan: 4 or greater and Treatment may vary due to age or medical condition, Ondansetron and Dexamethasone  Airway Management Planned: LMA  Additional Equipment: None  Intra-op Plan:   Post-operative Plan: Extubation in OR  Informed Consent: I have reviewed the patients History and Physical, chart, labs and discussed the procedure including the risks, benefits and alternatives for the proposed anesthesia with the patient or authorized representative who  has indicated his/her understanding and acceptance.     Dental advisory given  Plan Discussed with: CRNA  Anesthesia Plan Comments: (See PAT note 01/13/2020, Konrad Felix, PA-C)      Anesthesia Quick Evaluation

## 2020-01-13 NOTE — Telephone Encounter (Signed)
Patient received instructions yesterday to HOD warfarin prior to [facility] scheduled for Thursday 5/27.   She is calling today worried about low INR and risk for stroke while off warfarin.    I explained to patient that risk vs benefits , she should stay off anticoagulation 3-5 days prior to procedure to decrease bleeding during and after D&C. She should be able to resume warfarin the evening of the procedure as previously recommended.    Call placed to surgeon for further guidance. Patient wants to know if okay to take 1/2 tablet of warfarin today and still have procedure on 5/27. I recommend against it . But will discuss with surgeon as well.

## 2020-01-13 NOTE — Telephone Encounter (Signed)
Toni [name] is returning Jesse's call.

## 2020-01-13 NOTE — Telephone Encounter (Signed)
   Primary Cardiologist: Pixie Casino, MD  Chart reviewed as part of pre-operative protocol coverage. Given past medical history and time since last visit, based on ACC/AHA guidelines, Toni [name] would be at acceptable risk for the planned procedure without further cardiovascular testing.   Patient with diagnosis of ATRIAL FIBRILLATION on WARFARIN for anticoagulation.   Procedure: D&C  Date of procedure: 01/15/2020   CHADS2-VASc score of 5 (CHF, HTN, AGE, DM2, female)  SCR = 0.64  Platelet count = 196   Per office protocol, patient can hold WARFARIN for 5 days prior to procedure. Patient WILL NOT need bridging with Lovenox (enoxaparin) around procedure.   * PATIENT ALREADY HOLDING WARFARIN SICEN 01/10/2020 D/T HEMATURIA WITH INR = 1.2 TODAY 5/24*   Okay to hold until procedure, then resume at previously stable dose. Next INR already scheduled for 01/21/2020   I will route this recommendation to the requesting party via Marty fax function and remove from pre-op pool.  Please call with questions.  Jossie Ng. Teyon Odette NP-C    01/13/2020, 1:18 PM [facility] [place] [address] Office [phone] Fax [phone]

## 2020-01-13 NOTE — Progress Notes (Signed)
TeleHealth Visit:  Due to the COVID-19 pandemic, this visit was completed with telemedicine (audio/video) technology to reduce patient and provider exposure as well as to preserve personal protective equipment.   Toni [name] has verbally consented to this TeleHealth visit. The patient is located at home, the provider is located at the [facility] office. The participants in this visit include the listed provider and patient. Toni [name] was unable to use realtime audiovisual technology today and the telehealth visit was conducted via telephone.  Chief Complaint: OBESITY Toni [name] is here to discuss her progress with her obesity treatment plan along with follow-up of her obesity related diagnoses. Toni [name] is on the Category 1 Plan and states she is following her eating plan approximately 75% of the time. Toni [name] states she is doing physical and occupational therapy 5-6 times per week.  Today's visit was #: 82 Starting weight: 257 lbs Starting date: 03/08/2017  Interim History: Toni [name] hasn't been able to weigh at home, and she now has 24 hour nursing care. She is still struggling with residual COVID sequela and multiple sclerosis flare. She thinks she may be still losing weight and I am concerned that she may be cachectic, and she is certainly sarcopenic due to her multiple sclerosis. She notes her blood per was 100/76, pulse 70, and SpO2 at 98%.  Subjective:   1. MS (multiple sclerosis) (HCC) Toni [name] is followed by Neurology and is doing physical therapy, and she has 24 hour nursing care. She has been unable to weigh at home due to multiple sclerosis, but she suspects that she is still losing weight. Our goal is to maintain her weight. She notes her appetite has decreased but she has started using protein drinks to supplement her nutrition.   Assessment/Plan:   1. MS (multiple sclerosis) ([place]) Toni [name] is to continue to eat and drink at least 1-2 protein drinks per  day. Her next visit will be in office to evaluate her weight more accurately.  2. Class 2 severe obesity with serious comorbidity and body mass index (BMI) of 35.0 to 35.9 in adult, unspecified obesity type [facility]) Toni [name] is currently in the action stage of change. As such, her goal is to maintain weight for now. She has agreed to keeping a food journal and adhering to recommended goals of 1500 calories and 80+ grams of protein daily.   Toni [name]'s goal is to maintain her weight and minimize muscle loss from multiple sclerosis. We will continue to follow her closely to maximize her nutrition.  Exercise goals: As is (physical therapy).  Toni [name] has agreed to follow-up with our clinic in 6 weeks. She was informed of the importance of frequent follow-up visits to maximize her success with intensive lifestyle modifications for her multiple health conditions.  Objective:   VITALS: Per patient if applicable, see vitals. GENERAL: Alert and in no acute distress. CARDIOPULMONARY: No increased WOB. Speaking in clear sentences.  PSYCH: Pleasant and cooperative. Speech normal rate and rhythm. Affect is appropriate. Insight and judgement are appropriate. Attention is focused, linear, and appropriate.  NEURO: Oriented as arrived to appointment on time with no prompting.   Lab Results  Component Value Date   CREATININE 0.64 01/10/2020   BUN 26 (H) 01/10/2020   NA 137 01/10/2020   K 4.3 01/10/2020   CL 100 01/10/2020   CO2 30 01/10/2020   Lab Results  Component Value Date   ALT 17 10/16/2019   AST 19 10/16/2019   ALKPHOS 49 10/16/2019   BILITOT 0.6  10/16/2019   Lab Results  Component Value Date   HGBA1C 5.0 03/19/2019   HGBA1C 5.3 09/11/2018   HGBA1C 5.3 04/23/2018   HGBA1C 5.2 10/01/2017   HGBA1C 5.4 03/08/2017   Lab Results  Component Value Date   INSULIN 7.1 03/19/2019   INSULIN 13.0 09/11/2018   INSULIN 7.8 04/23/2018   INSULIN 20.4 11/27/2017   INSULIN 20.4 03/08/2017   Lab  Results  Component Value Date   TSH 1.374 10/10/2019   Lab Results  Component Value Date   CHOL 111 03/19/2019   HDL 57 03/19/2019   LDLCALC 43 03/19/2019   TRIG 53 03/19/2019   CHOLHDL 3.0 04/22/2010   Lab Results  Component Value Date   WBC 10.9 (H) 01/10/2020   HGB 15.4 (H) 01/10/2020   HCT 45.7 01/10/2020   MCV 93.6 01/10/2020   PLT 196 01/10/2020   Lab Results  Component Value Date   FERRITIN 159 08/27/2019    Attestation Statements:   Reviewed by clinician on day of visit: allergies, medications, problem list, medical history, surgical history, family history, social history, and previous encounter notes.  Time spent on visit including pre-visit chart review and post-visit charting and care was 30 minutes.    I, Trixie Dredge, am acting as transcriptionist for Dennard Nip, MD.  I have reviewed the above documentation for accuracy and completeness, and I agree with the above. - Dennard Nip, MD

## 2020-01-13 NOTE — Telephone Encounter (Signed)
Letter has been written and faxed to [facility] at Dr. Sherran Needs office.

## 2020-01-13 NOTE — Progress Notes (Addendum)
Anesthesia Chart Review   Case: [id number] Date/Time: 01/15/20 0715   Procedure: DILATATION & CURETTAGE/HYSTEROSCOPY WITH MYOSURE (N/A ) - pt in wheelchair-has MS   Anesthesia type: Choice   Pre-op diagnosis: polyp   [facility] ROOM 05 / [facility]   Surgeons: Arvella Nigh, MD      DISCUSSION:74 y.o. former smoker (3.5 pack years, quit 08/21/76) with h/o HLD, hypothyroidism, GERD, MS (baclofen pump in place, wheelchair bound), PAF (on Coumadin), OSA on CPAP, renal cell carcinoma, polyp scheduled for above procedure 01/15/2020 with Dr. Arvella Nigh.    Hospitalized 08/22/19-08/29/19 for COVID PNA.  Has struggled with hypotension since then.  On Midodrine.  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  Hospitalized 2/18-2/26/2021 for possible PNA vs HR and treated for presumed sepsis.  Pt in A-fib with RVR at this time.    Cardiology follows A-fib, orthostatic HTN, chronic diastolic heart failure.  Last seen by Dr. Lyman Bishop 10/24/2019.  Per OV note, "Her A. Fib is stable.  A repeat echo in February looked unchanged from a prior study in August 2020.  Plan follow-up with me in 3 months or sooner if necessary."   Last seen by pulmonologist 01/02/2020.  Per OV note, "Recent study showing nocturnal hypoxemia with concerns regarding patient needing oxygen supplementation with BiPAP."  Pt reports O2 sats in the 90s on room air at home.  Supplemental O2 has not been ordered.  Per pulmonologist, Dr. Ander Slade, 01/12/2020, "Patient has no acute preclusion to surgery from a respiratory perspective.  She has sleep apnea, has not been using BiPAP.  There is nothing that can be changed acutely.  Anesthesia needs to be aware that she has sleep apnea and place perioperative support.  There is nothing to change acutely from a respiratory perspective. We will optimize for surgery.  She is cleared to go ahead with surgery, aware that she has sleep disordered breathing."  Pt reports to PAT nurse she has  difficulty lying flat due to previous cervical surgery in 2005, C3-4, C4-5, C5-6, C6-7 with hardware in place.   Advised to hold coumadin 01/12/2020.  Per nurse with Coumadin clinic, "HOLD warfarin until D&C on 5/27, then resume warfarin at previously stable dose of 5mg  daily except for 7.5mg  every Monday. Repeat INR on 01/21/2020"  Anticipate pt can proceed with planned procedure barring acute status change and after evaluation DOS, same day workup.  Pt previously scheduled at [facility], advised scheduler to move to [facility] due to medical history.  Discussed with Dr. Gifford Shave.  VS: There were no vitals taken for this visit.  PROVIDERS: Prince Solian, MD is PCP   Lyman Bishop, MD is Cardiologist  Dohmeier, Marcene Brawn, MD is Neurologist last seen 12/01/2019 LABS: Labs reviewed: Acceptable for surgery. (all labs ordered are listed, but only abnormal results are displayed)  Labs Reviewed - No data to display   IMAGES:   EKG:   CV: Echo 10/10/2019 IMPRESSIONS    1. Left ventricular ejection fraction, by estimation, is 55 to 60%. The  left ventricle has normal function. The left ventricle has no regional  wall motion abnormalities. Left ventricular diastolic function could not  be evaluated.  2. Right ventricular systolic function is normal. The right ventricular  size is normal. There is normal pulmonary artery systolic pressure. The  estimated right ventricular systolic pressure is [id number] mmHg.  3. Left atrial size was severely dilated.  4. Right atrial size was mild to moderately dilated.  5. The mitral valve is  grossly normal. Mild to moderate mitral valve  regurgitation.  6. The aortic valve is tricuspid. Aortic valve regurgitation is not  visualized. No aortic stenosis is present.  7. The inferior vena cava is dilated in size with <50% respiratory  variability, suggesting right atrial pressure of 15 mmHg.  Past Medical History:  Diagnosis Date  . Abnormality of gait 03/01/2015  .  Anxiety   . Arthritis    "knees" (01/19/2016)  . Atrial fibrillation with RVR ([place]) 01/19/2016  . Back pain   . Cancer ([place])    / RENAL CELL CARCINOMA - GETS CT SCAN EVERY 6 MONTHS TO WATCH   . Chest pain   . Chronic pain    "nerve pain from the MS"  . Constipation   . Depression   . DVT (deep venous thrombosis) ([place]) 1983   "RLE; may have just been phlebitis; it was before the age of dopplers"  . Dysrhythmia   . 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 GERD (gastroesophageal reflux disease)   . History of stress test 06/21/10   limited exam with some degree of breast attenuatin of the apex however a component of apical ischemia is not excluded, EF 62%; cardiac cath   . HLD (hyperlipidemia)   . Hx of echocardiogram 04/22/10   EF 55-60%, no valve issues  . Hypertension    pt denies   . Hypotension   . Hypothyroidism   . Joint pain   . Migraine    "none since I stopped beta blocker in 1990s" (01/19/2016)  . Multiple sclerosis ([place])    has had this may 1989-Dohmeir reg doc  . OSA on CPAP    bipap machine - SLEEP APNEA WORSE SINCE COVID IN 1/21 PER PATIENT SETTING HAS INCREASED FROM 9 TO 30   . Osteoarthritis   . PAF (paroxysmal atrial fibrillation) ([place])    on coumadin; documented on monitor 06/2010  . Pneumonia 11/2011  . Shortness of breath    pt denies on preop of 5/24   . Thyroid disease     Past Surgical History:  Procedure Laterality Date  . ANTERIOR CERVICAL DECOMP/DISCECTOMY FUSION  01/2004  . BACK SURGERY    . CARDIAC CATHETERIZATION  06/22/2010   normal coronary arteries, PAF  . CATARACT EXTRACTION, BILATERAL Bilateral   . CHILD BIRTHS     X2  . DILATION AND CURETTAGE OF UTERUS    . INFUSION PUMP IMPLANTATION  ~ 2009   baclofen infusion in lower abd  . PAIN PUMP REVISION N/A 07/29/2014   Procedure: Baclofen pump replacement;  Surgeon: Erline Levine, MD;  Location: [facility];  Service: Neurosurgery;  Laterality:  N/A;  Baclofen pump replacement  . TONSILLECTOMY AND ADENOIDECTOMY    . VARICOSE VEIN SURGERY  ~ 1968  . VENOUS ABLATION  12/16/2010   radiofreq ablation -Dr Elisabeth Cara and [facility]  . WISDOM TOOTH EXTRACTION      MEDICATIONS: No current facility-administered medications for this encounter.   Marland Kitchen acetaminophen (TYLENOL) 500 MG tablet  . ALPRAZolam (XANAX) 0.5 MG tablet  . Ascorbic Acid (VITAMIN C) 500 MG CAPS  . baclofen (LIORESAL) 20 MG tablet  . buPROPion (WELLBUTRIN XL) 300 MG 24 hr tablet  . Calcium Carbonate-Vit D-Min (CALTRATE 600+D PLUS MINERALS) 600-800 MG-UNIT TABS  . cephALEXin (KEFLEX) 250 MG capsule  . cetirizine (ZYRTEC) 10 MG tablet  . Cholecalciferol (VITAMIN D) 2000 units tablet  .  Coenzyme Q10 (CO Q-10 PO)  . corticotropin (ACTHAR) 80 UNIT/ML injectable gel  . Cranberry 1000 MG CAPS  . diltiazem (CARDIZEM CD) 180 MG 24 hr capsule  . diltiazem (CARDIZEM) 60 MG tablet  . docusate sodium (COLACE) 100 MG capsule  . famotidine (PEPCID) 20 MG tablet  . fluconazole (DIFLUCAN) 150 MG tablet  . fluticasone (FLONASE) 50 MCG/ACT nasal spray  . furosemide (LASIX) 20 MG tablet  . gabapentin (NEURONTIN) 300 MG capsule  . GENERLAC 10 GM/15ML SOLN  . levothyroxine (SYNTHROID, LEVOTHROID) 75 MCG tablet  . melatonin 3 MG TABS tablet  . midodrine (PROAMATINE) 5 MG tablet  . Omega-3 Fatty Acids (FISH OIL PO)  . omeprazole (PRILOSEC) 20 MG capsule  . ondansetron (ZOFRAN) 4 MG tablet  . psyllium (METAMUCIL SMOOTH TEXTURE) 28 % packet  . simvastatin (ZOCOR) 10 MG tablet  . SYRINGE/NEEDLE, DISP, 1 ML (BD LUER-LOK SYRINGE) 25G X 5/8" 1 ML MISC  . Tamsulosin HCl (FLOMAX) 0.4 MG CAPS  . warfarin (COUMADIN) 5 MG tablet    Maia Plan [facility] Pre-Surgical Testing [phone] 01/13/20  10:54 AM

## 2020-01-14 ENCOUNTER — Telehealth: Payer: Self-pay

## 2020-01-14 ENCOUNTER — Encounter (HOSPITAL_COMMUNITY): Payer: Self-pay | Admitting: Obstetrics and Gynecology

## 2020-01-14 NOTE — Telephone Encounter (Signed)
Doctors office called and stated to check w/Dr [facility] regarding hold time for surgery

## 2020-01-14 NOTE — Telephone Encounter (Signed)
Returned call to [employer]. Pt takes warfarin for afib with CHADS2VASc score of 5 (age, sex, CHF, HTN, DM) and was previously cleared on 5/21 note to hold warfarin for 5 days.  Per Raquel's note yesterday, pt was advised to hold warfarin for 3-5 days prior to procedure, however pt was concerned about stroke risk off of warfarin and wanted to take 1/2 tablet of warfarin yesterday. She reached out to surgeon's office who is returning call today. They are deferring to [name]. Called pt and advised her not to take any Coumadin today since her procedure is tomorrow. Advised her she should resume her warfarin tomorrow evening after her procedure. She verbalized understanding.

## 2020-01-15 ENCOUNTER — Ambulatory Visit (HOSPITAL_COMMUNITY)
Admission: RE | Admit: 2020-01-15 | Discharge: 2020-01-15 | Disposition: A | Discharge status: Home / Self Care | Payer: Medicare Other | Attending: Obstetrics and Gynecology | Admitting: Obstetrics and Gynecology

## 2020-01-15 ENCOUNTER — Encounter (HOSPITAL_COMMUNITY)
Admission: RE | Disposition: A | Discharge status: Home / Self Care | Payer: Self-pay | Source: Home / Self Care | Attending: Obstetrics and Gynecology

## 2020-01-15 ENCOUNTER — Ambulatory Visit (HOSPITAL_COMMUNITY): Payer: Medicare Other | Admitting: Physician Assistant

## 2020-01-15 ENCOUNTER — Other Ambulatory Visit: Payer: Self-pay

## 2020-01-15 ENCOUNTER — Encounter (HOSPITAL_COMMUNITY): Payer: Self-pay | Admitting: Obstetrics and Gynecology

## 2020-01-15 DIAGNOSIS — E785 Hyperlipidemia, unspecified: Secondary | ICD-10-CM | POA: Insufficient documentation

## 2020-01-15 DIAGNOSIS — N8502 Endometrial intraepithelial neoplasia [EIN]: Secondary | ICD-10-CM | POA: Diagnosis not present

## 2020-01-15 DIAGNOSIS — I4891 Unspecified atrial fibrillation: Secondary | ICD-10-CM | POA: Insufficient documentation

## 2020-01-15 DIAGNOSIS — Z7901 Long term (current) use of anticoagulants: Secondary | ICD-10-CM | POA: Diagnosis not present

## 2020-01-15 DIAGNOSIS — R339 Retention of urine, unspecified: Secondary | ICD-10-CM | POA: Diagnosis not present

## 2020-01-15 DIAGNOSIS — Z791 Long term (current) use of non-steroidal anti-inflammatories (NSAID): Secondary | ICD-10-CM | POA: Diagnosis not present

## 2020-01-15 DIAGNOSIS — N7011 Chronic salpingitis: Secondary | ICD-10-CM | POA: Diagnosis not present

## 2020-01-15 DIAGNOSIS — N319 Neuromuscular dysfunction of bladder, unspecified: Secondary | ICD-10-CM | POA: Diagnosis not present

## 2020-01-15 DIAGNOSIS — Z86718 Personal history of other venous thrombosis and embolism: Secondary | ICD-10-CM | POA: Diagnosis not present

## 2020-01-15 DIAGNOSIS — Z20822 Contact with and (suspected) exposure to covid-19: Secondary | ICD-10-CM | POA: Diagnosis not present

## 2020-01-15 DIAGNOSIS — Z981 Arthrodesis status: Secondary | ICD-10-CM | POA: Insufficient documentation

## 2020-01-15 DIAGNOSIS — Z885 Allergy status to narcotic agent status: Secondary | ICD-10-CM | POA: Diagnosis not present

## 2020-01-15 DIAGNOSIS — K219 Gastro-esophageal reflux disease without esophagitis: Secondary | ICD-10-CM | POA: Diagnosis not present

## 2020-01-15 DIAGNOSIS — Z9849 Cataract extraction status, unspecified eye: Secondary | ICD-10-CM | POA: Insufficient documentation

## 2020-01-15 DIAGNOSIS — Z888 Allergy status to other drugs, medicaments and biological substances status: Secondary | ICD-10-CM | POA: Diagnosis not present

## 2020-01-15 DIAGNOSIS — Z993 Dependence on wheelchair: Secondary | ICD-10-CM | POA: Insufficient documentation

## 2020-01-15 DIAGNOSIS — E039 Hypothyroidism, unspecified: Secondary | ICD-10-CM | POA: Insufficient documentation

## 2020-01-15 DIAGNOSIS — Z79899 Other long term (current) drug therapy: Secondary | ICD-10-CM | POA: Diagnosis not present

## 2020-01-15 DIAGNOSIS — E119 Type 2 diabetes mellitus without complications: Secondary | ICD-10-CM | POA: Insufficient documentation

## 2020-01-15 DIAGNOSIS — I10 Essential (primary) hypertension: Secondary | ICD-10-CM | POA: Diagnosis not present

## 2020-01-15 DIAGNOSIS — N840 Polyp of corpus uteri: Secondary | ICD-10-CM | POA: Diagnosis not present

## 2020-01-15 DIAGNOSIS — G35 Multiple sclerosis: Secondary | ICD-10-CM | POA: Insufficient documentation

## 2020-01-15 DIAGNOSIS — G4730 Sleep apnea, unspecified: Secondary | ICD-10-CM | POA: Insufficient documentation

## 2020-01-15 DIAGNOSIS — Z792 Long term (current) use of antibiotics: Secondary | ICD-10-CM | POA: Diagnosis not present

## 2020-01-15 DIAGNOSIS — C649 Malignant neoplasm of unspecified kidney, except renal pelvis: Secondary | ICD-10-CM | POA: Insufficient documentation

## 2020-01-15 DIAGNOSIS — J449 Chronic obstructive pulmonary disease, unspecified: Secondary | ICD-10-CM | POA: Insufficient documentation

## 2020-01-15 DIAGNOSIS — Z87891 Personal history of nicotine dependence: Secondary | ICD-10-CM | POA: Diagnosis not present

## 2020-01-15 HISTORY — DX: Malignant (primary) neoplasm, unspecified: C80.1

## 2020-01-15 HISTORY — DX: Hypotension, unspecified: I95.9

## 2020-01-15 HISTORY — PX: DILATATION & CURETTAGE/HYSTEROSCOPY WITH MYOSURE: SHX6511

## 2020-01-15 HISTORY — DX: Cardiac arrhythmia, unspecified: I49.9

## 2020-01-15 LAB — ABO/RH: ABO/RH(D): A POS

## 2020-01-15 LAB — COMPREHENSIVE METABOLIC PANEL
ALT: 29 U/L (ref 0–44)
AST: 30 U/L (ref 15–41)
Albumin: 3.3 g/dL — ABNORMAL LOW (ref 3.5–5.0)
Alkaline Phosphatase: 49 U/L (ref 38–126)
Anion gap: 8 (ref 5–15)
BUN: 27 mg/dL — ABNORMAL HIGH (ref 8–23)
CO2: 34 mmol/L — ABNORMAL HIGH (ref 22–32)
Calcium: 9.2 mg/dL (ref 8.9–10.3)
Chloride: 98 mmol/L (ref 98–111)
Creatinine, Ser: 0.34 mg/dL — ABNORMAL LOW (ref 0.44–1.00)
GFR calc Af Amer: >60 mL/min (ref >60)
GFR calc non Af Amer: >60 mL/min (ref >60)
Glucose, Bld: 98 mg/dL (ref 70–99)
Potassium: 4 mmol/L (ref 3.5–5.1)
Sodium: 140 mmol/L (ref 135–145)
Total Bilirubin: 0.2 mg/dL — ABNORMAL LOW (ref 0.3–1.2)
Total Protein: 5.9 g/dL — ABNORMAL LOW (ref 6.5–8.1)

## 2020-01-15 LAB — CBC
HCT: 42.1 % (ref 36.0–46.0)
Hemoglobin: 13.5 g/dL (ref 12.0–15.0)
MCH: 31.3 pg (ref 26.0–34.0)
MCHC: 32.1 g/dL (ref 30.0–36.0)
MCV: 97.7 fL (ref 80.0–100.0)
Platelets: 242 10*3/uL (ref 150–400)
RBC: 4.31 MIL/uL (ref 3.87–5.11)
RDW: 12.1 % (ref 11.5–15.5)
WBC: 8 10*3/uL (ref 4.0–10.5)
nRBC: 0 % (ref 0.0–0.2)

## 2020-01-15 LAB — SARS CORONAVIRUS 2 BY RT PCR (HOSPITAL ORDER, PERFORMED IN ~~LOC~~ HOSPITAL LAB): SARS Coronavirus 2: NEGATIVE

## 2020-01-15 LAB — TYPE AND SCREEN
ABO/RH(D): A POS
Antibody Screen: NEGATIVE

## 2020-01-15 LAB — PROTIME-INR
INR: 1.1 (ref 0.8–1.2)
Prothrombin Time: 13.5 seconds (ref 11.4–15.2)

## 2020-01-15 LAB — APTT: aPTT: 29 seconds (ref 24–36)

## 2020-01-15 SURGERY — DILATATION & CURETTAGE/HYSTEROSCOPY WITH MYOSURE
Anesthesia: General

## 2020-01-15 MED ORDER — PHENYLEPHRINE 40 MCG/ML (10ML) SYRINGE FOR IV PUSH (FOR BLOOD PRESSURE SUPPORT)
PREFILLED_SYRINGE | INTRAVENOUS | Status: AC
  Filled 2020-01-15: qty 10

## 2020-01-15 MED ORDER — PROPOFOL 10 MG/ML IV BOLUS
INTRAVENOUS | Status: DC | PRN
  Administered 2020-01-15: 150 mg via INTRAVENOUS

## 2020-01-15 MED ORDER — SILVER NITRATE-POT NITRATE 75-25 % EX MISC
CUTANEOUS | Status: AC
  Filled 2020-01-15: qty 10

## 2020-01-15 MED ORDER — CEFAZOLIN SODIUM-DEXTROSE 2-4 GM/100ML-% IV SOLN
2.0000 g | INTRAVENOUS | Status: AC
  Administered 2020-01-15: 2 g via INTRAVENOUS
  Filled 2020-01-15: qty 100

## 2020-01-15 MED ORDER — ACETAMINOPHEN 500 MG PO TABS
1000.0000 mg | ORAL_TABLET | Freq: Once | ORAL | Status: AC
  Administered 2020-01-15: 1000 mg via ORAL
  Filled 2020-01-15: qty 2

## 2020-01-15 MED ORDER — LIDOCAINE 2% (20 MG/ML) 5 ML SYRINGE
INTRAMUSCULAR | Status: AC
  Filled 2020-01-15: qty 5

## 2020-01-15 MED ORDER — PROMETHAZINE HCL 25 MG/ML IJ SOLN: 6.2500 mg | INTRAMUSCULAR | Status: DC | PRN

## 2020-01-15 MED ORDER — SODIUM CHLORIDE 0.9 % IR SOLN
Status: DC | PRN
  Administered 2020-01-15: 3000 mL

## 2020-01-15 MED ORDER — LIDOCAINE HCL (PF) 1 % IJ SOLN
INTRAMUSCULAR | Status: AC
  Filled 2020-01-15: qty 30

## 2020-01-15 MED ORDER — PHENYLEPHRINE HCL (PRESSORS) 10 MG/ML IV SOLN
INTRAVENOUS | Status: AC
  Filled 2020-01-15: qty 1

## 2020-01-15 MED ORDER — CHLORHEXIDINE GLUCONATE 0.12 % MT SOLN: 15.0000 mL | Freq: Once | OROMUCOSAL | Status: AC

## 2020-01-15 MED ORDER — PHENYLEPHRINE 40 MCG/ML (10ML) SYRINGE FOR IV PUSH (FOR BLOOD PRESSURE SUPPORT)
PREFILLED_SYRINGE | INTRAVENOUS | Status: DC | PRN
  Administered 2020-01-15: 40 ug via INTRAVENOUS
  Administered 2020-01-15 (×3): 80 ug via INTRAVENOUS
  Administered 2020-01-15: 120 ug via INTRAVENOUS

## 2020-01-15 MED ORDER — ONDANSETRON HCL 4 MG/2ML IJ SOLN
INTRAMUSCULAR | Status: DC | PRN
  Administered 2020-01-15: 4 mg via INTRAVENOUS

## 2020-01-15 MED ORDER — FENTANYL CITRATE (PF) 100 MCG/2ML IJ SOLN
INTRAMUSCULAR | Status: AC
  Filled 2020-01-15: qty 2

## 2020-01-15 MED ORDER — DEXAMETHASONE SODIUM PHOSPHATE 10 MG/ML IJ SOLN
INTRAMUSCULAR | Status: DC | PRN
  Administered 2020-01-15: 4 mg via INTRAVENOUS

## 2020-01-15 MED ORDER — DEXAMETHASONE SODIUM PHOSPHATE 10 MG/ML IJ SOLN
INTRAMUSCULAR | Status: AC
  Filled 2020-01-15: qty 1

## 2020-01-15 MED ORDER — PROPOFOL 10 MG/ML IV BOLUS
INTRAVENOUS | Status: AC
  Filled 2020-01-15: qty 40

## 2020-01-15 MED ORDER — LIDOCAINE 2% (20 MG/ML) 5 ML SYRINGE
INTRAMUSCULAR | Status: DC | PRN
  Administered 2020-01-15: 80 mg via INTRAVENOUS

## 2020-01-15 MED ORDER — ONDANSETRON HCL 4 MG/2ML IJ SOLN
INTRAMUSCULAR | Status: AC
  Filled 2020-01-15: qty 2

## 2020-01-15 MED ORDER — FENTANYL CITRATE (PF) 100 MCG/2ML IJ SOLN
INTRAMUSCULAR | Status: DC | PRN
  Administered 2020-01-15: 50 ug via INTRAVENOUS

## 2020-01-15 MED ORDER — ORAL CARE MOUTH RINSE
15.0000 mL | Freq: Once | OROMUCOSAL | Status: AC
  Administered 2020-01-15: 15 mL via OROMUCOSAL

## 2020-01-15 MED ORDER — FENTANYL CITRATE (PF) 100 MCG/2ML IJ SOLN: 25.0000 ug | INTRAMUSCULAR | Status: DC | PRN

## 2020-01-15 MED ORDER — LACTATED RINGERS IV SOLN: INTRAVENOUS | Status: DC | PRN

## 2020-01-15 MED ORDER — LACTATED RINGERS IV SOLN: INTRAVENOUS | Status: DC

## 2020-01-15 SURGICAL SUPPLY — 22 items
CANISTER SUCT 3000ML PPV (MISCELLANEOUS) ×6 IMPLANT
CATH ROBINSON RED A/P 16FR (CATHETERS) ×3 IMPLANT
COVER WAND RF STERILE (DRAPES) ×3 IMPLANT
DEVICE MYOSURE LITE (MISCELLANEOUS) IMPLANT
DEVICE MYOSURE REACH (MISCELLANEOUS) ×2 IMPLANT
DILATOR CANAL MILEX (MISCELLANEOUS) IMPLANT
GAUZE 4X4 16PLY RFD (DISPOSABLE) ×3 IMPLANT
GLOVE BIO SURGEON STRL SZ7 (GLOVE) ×6 IMPLANT
GOWN STRL REUS W/TWL XL LVL3 (GOWN DISPOSABLE) ×3 IMPLANT
IV NS IRRIG 3000ML ARTHROMATIC (IV SOLUTION) ×3 IMPLANT
KIT PROCEDURE FLUENT (KITS) ×3 IMPLANT
KIT TURNOVER CYSTO (KITS) ×3 IMPLANT
MYOSURE XL FIBROID (MISCELLANEOUS)
NDL SPNL 18GX3.5 QUINCKE PK (NEEDLE) ×1 IMPLANT
NEEDLE SPNL 18GX3.5 QUINCKE PK (NEEDLE) ×3 IMPLANT
PACK VAGINAL MINOR WOMEN LF (CUSTOM PROCEDURE TRAY) ×3 IMPLANT
PAD OB MATERNITY 4.3X12.25 (PERSONAL CARE ITEMS) ×3 IMPLANT
PAD PREP 24X48 CUFFED NSTRL (MISCELLANEOUS) ×3 IMPLANT
SEAL CERVICAL OMNI LOK (ABLATOR) IMPLANT
SEAL ROD LENS SCOPE MYOSURE (ABLATOR) ×3 IMPLANT
SYSTEM TISS REMOVAL MYOSURE XL (MISCELLANEOUS) IMPLANT
WATER STERILE IRR 500ML POUR (IV SOLUTION) ×3 IMPLANT

## 2020-01-15 NOTE — Brief Op Note (Signed)
01/15/2020  8:13 AM  PATIENT:  Toni [name]  74 y.o. female  PRE-OPERATIVE DIAGNOSIS:  polyp  POST-OPERATIVE DIAGNOSIS:  polyp  PROCEDURE:  Procedure(s) with comments: DILATATION & CURETTAGE/HYSTEROSCOPY WITH MYOSURE (N/A) - pt in wheelchair-has MS  SURGEON:  Surgeon(s) and Role:    * Arvella Nigh, MD - Primary  PHYSICIAN ASSISTANT:   ASSISTANTS: none   ANESTHESIA:   general  EBL: minimal  BLOOD ADMINISTERED:none  DRAINS: none   LOCAL MEDICATIONS USED:  NONE  SPECIMEN:  Source of Specimen:  endometrial polyp and currettings  DISPOSITION OF SPECIMEN:  PATHOLOGY  COUNTS:  YES  TOURNIQUET:  * No tourniquets in log *  DICTATION: .Other Dictation: Dictation Number [id number]  PLAN OF CARE: Discharge to home after PACU  PATIENT DISPOSITION:  PACU - hemodynamically stable.   Delay start of Pharmacological VTE agent (>24hrs) due to surgical blood loss or risk of bleeding: not applicable

## 2020-01-15 NOTE — Anesthesia Procedure Notes (Addendum)
Procedure Name: LMA Insertion Date/Time: 01/15/2020 7:30 AM Performed by: Niel Hummer, CRNA Pre-anesthesia Checklist: Patient identified, Emergency Drugs available, Suction available and Patient being monitored Patient Re-evaluated:Patient Re-evaluated prior to induction Oxygen Delivery Method: Circle System Utilized Preoxygenation: Pre-oxygenation with 100% oxygen Induction Type: IV induction Ventilation: Mask ventilation without difficulty LMA: LMA inserted LMA Size: 4.0 Number of attempts: 1 Airway Equipment and Method: Bite block Placement Confirmation: positive ETCO2 Tube secured with: Tape Dental Injury: Teeth and Oropharynx as per pre-operative assessment  Comments: Inserted by lauren ferm, srna

## 2020-01-15 NOTE — Progress Notes (Signed)
Pt and family concerned about discharge arrangements for transportation.  Advised famil discharge contingent on voiding or need for catheter.  At present, family attempting to arrange transport @ 1 but RN will notify family if pt does not meet dischrage ctiteria at the time

## 2020-01-15 NOTE — Progress Notes (Signed)
PACU [place] Spoke to [place], the transport services [facility], Patient's pick up is scheduled at 1 pm today. Family are aware.

## 2020-01-15 NOTE — H&P (Signed)
  History and physical exam unchanged 

## 2020-01-15 NOTE — Transfer of Care (Signed)
Immediate Anesthesia Transfer of Care Note  Patient: [name]  Procedure(s) Performed: DILATATION & CURETTAGE/HYSTEROSCOPY WITH MYOSURE (N/A )  Patient Location: PACU  Anesthesia Type:General  Level of Consciousness: awake  Airway & Oxygen Therapy: Patient Spontanous Breathing and Patient connected to face mask oxygen  Post-op Assessment: Report given to RN, Post -op Vital signs reviewed and stable and Patient moving all extremities X 4  Post vital signs: Reviewed and stable  Last Vitals:  Vitals Value Taken Time  BP    Temp    Pulse 64 01/15/20 0809  Resp    SpO2 100 % 01/15/20 0809  Vitals shown include unvalidated device data.  Last Pain:  Vitals:   01/15/20 0633  TempSrc: Oral  PainSc:       Patients Stated Pain Goal: 4 ([phone] [id number])  Complications: No apparent anesthesia complications

## 2020-01-15 NOTE — Anesthesia Postprocedure Evaluation (Signed)
Anesthesia Post Note  Patient: Toni [name]  Procedure(s) Performed: [place] (N/A )     Patient location during evaluation: PACU Anesthesia Type: General Level of consciousness: awake and alert and oriented Pain management: pain level controlled Vital Signs Assessment: post-procedure vital signs reviewed and stable Respiratory status: spontaneous breathing, nonlabored ventilation and respiratory function stable Cardiovascular status: blood pressure returned to baseline Postop Assessment: no apparent nausea or vomiting Anesthetic complications: no    Last Vitals:  Vitals:   01/15/20 0942 01/15/20 0953  BP:  120/60  Pulse:  77  Resp:  14  Temp: 36.5 C 36.5 C  SpO2:  97%    Last Pain:  Vitals:   01/15/20 0953  TempSrc: Oral  PainSc:                  Brennan Bailey

## 2020-01-15 NOTE — Op Note (Signed)
NAME: Toni [name], Toni [name] MEDICAL RECORD [id number] ACCOUNT [id number] DATE OF BIRTH:12-16-1945 FACILITY[facility] LOCATION: [facility] PHYSICIAN:Manasa Spease Sherran Needs, MD  OPERATIVE REPORT  DATE OF PROCEDURE:  01/15/2020  PREOPERATIVE DIAGNOSIS:  Endometrial polyp.  POSTOPERATIVE DIAGNOSIS:  Endometrial polyp.  OPERATIVE PROCEDURE:  Hysteroscopy with resection of endometrial polyp.  Endometrial curettings.  SURGEON:  Darlyn Chamber, MD  ANESTHESIA:  General.  ESTIMATED BLOOD LOSS:  Minimal.  PACKS AND DRAINS:  None.  INTRAOPERATIVE BLOOD PLACED:  None.  COMPLICATIONS:  None.  INDICATIONS:  Dictated in history and physical.  DESCRIPTION OF PROCEDURE:  The patient was taken to the OR and placed in the supine position.  After satisfactory level of general anesthesia was obtained, the patient was placed in the dorsal lithotomy position using the Allen stirrups.  Perineum and  vagina were prepped out with Betadine and draped in sterile field.  Speculum was placed in the vaginal vault.  Cervix grasped with single tooth tenaculum.  Uterus sounded to 9 cm.  Cervix was serially dilated.  Hysteroscope was introduced.  Intrauterine  cavity was distended using saline.  Visualization revealed a fundal polyp in the endometrial cavity.  The MyoSure was brought in place.  The polyp was completely resected and sent to pathology.  Hysteroscope was then removed and endometrial curettings  were then obtained.  Total deficit was 50 mL.  There was no active bleeding, signs of perforation or complications.  Single tooth tenaculum and speculum removed.  The patient was taken out of dorsal lithotomy position once alert and extubated and transferred to recovery room in good condition.  Sponge, instrument and needle counts were correct by the circulating nurse.  CN/NUANCE  D:01/15/2020 T:01/15/2020 JOB:[id number]

## 2020-01-16 ENCOUNTER — Encounter: Payer: Self-pay | Admitting: *Deleted

## 2020-01-16 LAB — SURGICAL PATHOLOGY

## 2020-01-21 ENCOUNTER — Telehealth: Payer: Self-pay | Admitting: Neurology

## 2020-01-21 ENCOUNTER — Ambulatory Visit (INDEPENDENT_AMBULATORY_CARE_PROVIDER_SITE_OTHER): Payer: Medicare Other | Admitting: Cardiology

## 2020-01-21 DIAGNOSIS — Z7901 Long term (current) use of anticoagulants: Secondary | ICD-10-CM | POA: Diagnosis not present

## 2020-01-21 LAB — POCT INR: INR: 1.5 — AB (ref 2.0–3.0)

## 2020-01-21 NOTE — Telephone Encounter (Signed)
Pt called to request a refill for her baclofen pump advised next avail with MD (Dr.Willis) is the end of sept at the time of call

## 2020-01-22 ENCOUNTER — Telehealth: Payer: Self-pay | Admitting: *Deleted

## 2020-01-22 DIAGNOSIS — J449 Chronic obstructive pulmonary disease, unspecified: Secondary | ICD-10-CM | POA: Diagnosis not present

## 2020-01-22 DIAGNOSIS — R0902 Hypoxemia: Secondary | ICD-10-CM | POA: Diagnosis not present

## 2020-01-22 NOTE — Telephone Encounter (Signed)
Baclofen pump refill due on Juy 27,2021. I call and schedule pt for March 11, 2020 at 200pm. PT verbalized understanding.

## 2020-01-22 NOTE — Telephone Encounter (Addendum)
Patient called and moved her appt to 6/18 so her daughter can come to the appt. Worked arounded the daughter schedule

## 2020-01-26 ENCOUNTER — Inpatient Hospital Stay: Payer: Medicare Other | Admitting: Gynecologic Oncology

## 2020-01-26 ENCOUNTER — Telehealth: Payer: Self-pay | Admitting: Internal Medicine

## 2020-01-26 NOTE — Telephone Encounter (Signed)
Patient is requesting for her husband, [name], to accompany her during appointments scheduled for 01/28/20 for coumadin and follow up with Dr. Debara Pickett. She states she requires assistance due to being in a wheel chair. Please advise.

## 2020-01-26 NOTE — Telephone Encounter (Signed)
Pts husband [name] will accompany to her 01/28/20 OV die tot being in a wheelchair and ambulation issues.

## 2020-01-28 ENCOUNTER — Ambulatory Visit (INDEPENDENT_AMBULATORY_CARE_PROVIDER_SITE_OTHER): Payer: Medicare Other | Admitting: Internal Medicine

## 2020-01-28 ENCOUNTER — Encounter: Payer: Self-pay | Admitting: Internal Medicine

## 2020-01-28 ENCOUNTER — Other Ambulatory Visit: Payer: Self-pay

## 2020-01-28 ENCOUNTER — Ambulatory Visit (INDEPENDENT_AMBULATORY_CARE_PROVIDER_SITE_OTHER): Payer: Medicare Other | Admitting: Pharmacist Clinician (PhC)/ Clinical Pharmacy Specialist

## 2020-01-28 VITALS — BP 110/54 | HR 132 | Ht 62.0 in | Wt 171.0 lb

## 2020-01-28 DIAGNOSIS — G35 Multiple sclerosis: Secondary | ICD-10-CM | POA: Diagnosis not present

## 2020-01-28 DIAGNOSIS — I4821 Permanent atrial fibrillation: Secondary | ICD-10-CM

## 2020-01-28 DIAGNOSIS — Z7901 Long term (current) use of anticoagulants: Secondary | ICD-10-CM

## 2020-01-28 DIAGNOSIS — I4891 Unspecified atrial fibrillation: Secondary | ICD-10-CM

## 2020-01-28 DIAGNOSIS — I5032 Chronic diastolic (congestive) heart failure: Secondary | ICD-10-CM | POA: Diagnosis not present

## 2020-01-28 LAB — POCT INR: INR: 2.4 (ref 2.0–3.0)

## 2020-01-28 MED ORDER — DILTIAZEM HCL ER COATED BEADS 240 MG PO CP24: 240.0000 mg | ORAL_CAPSULE | Freq: Every day | ORAL | 3 refills | Status: DC

## 2020-01-28 NOTE — Progress Notes (Signed)
OFFICE NOTE  Chief Complaint:  Weight loss, occasional dizziness  Primary Care Physician: Prince Solian, MD  HPI:  Toni [name] is a 74 year old female with multiple sclerosis, paroxysmal atrial fibrillation, on Coumadin, morbid obesity, obstructive sleep apnea, and dyslipidemia. She has had worsening swelling in her legs, thought to be due to an MS flare. She has a history of varicose veins with severe venous insufficiency in both the right and left greater saphenous veins. The right greater saphenous vein was actually successfully ablated by my partner, Dr. Elisabeth Cara, about 1 to 2 years ago. She does have residual reflux in the left greater saphenous vein extending from the proximal calf up to the saphenofemoral junction. The vein measured between 4 and 8 mm with between 1 and 6 seconds of reflux. We had discussed ablation of that before, but she has canceled several times at procedures. At this point, I think there is little benefit to continuing to try for her to undergo ablation. 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 She can wear work showed a compression stockings and this does seem to help her. With regards to her atrial fibrillation she has not had significant recurrence to her knowledge. She continues on warfarin and has been therapeutic, with her INR of 1.9 in the office by fingerstick today.  Toni [name] returns today for followup. She reports her MS is somewhat progressive. She continues to have some enlargement of her legs but no significant swelling. There is a mild varicosities in the right lower extremity. She is unaware of her atrial fibrillation. INR today was 2.3.   I saw Toni [name] back in the office today,  She is describing more shortness of breath and feels that she's been in A. Fib more recently. In fact today6/21her EKG does show persistent atrial fibrillation. In the past she's been paroxysmal , but it may be that she is  more frequently having A. Fib. She has trouble telling whether not she is more short of breath or symptomatic from A. Fib versus her multiple sclerosis. She is also reporting more swelling in her left leg.  Toni [name] returns for follow-up today. She recently saw her neurologist regarding her MS which seems to be flaring. In addition she was noted to have significant apneic events despite using CPAP. There is concern for possible central sleep apnea. Both of these episodes are probably contributing to her recurrent atrial fibrillation. She's been wearing a monitor for a period of time which indicates a burden of A. fib of about 80% of the time. It does not appear that she is in persistent A. fib, rather she has had some episodes of sinus rhythm, but they're much less frequent than her A. Fib.  I saw Toni [name] back today in the office. Recently I saw her as an add-on visit briefly as she was describing some chest pain when she was getting her INR checked. An EKG at that time showed no ischemia. I did not feel that this was cardiac. She continues intermittently have abnormal pains both in her chest as well as her abdomen and extremities. This may be related to MS. She's had difficulty with CPAP and apparently getting a good mask fit. She has been a persistent atrial fibrillation for about 6 months. It's unclear whether or not she symptomatic with this and therefore I have not pursued a rhythm control strategy. Her rate control is generally pretty good and she has been  therapeutic on warfarin. She is reporting worsening swelling of her legs although her weight she says is been stable. She has a history of venous insufficiency and had a right greater saphenous vein ablation by one of my partners. She's concerned about more swelling in her left leg and whether she's developed worsening venous insufficiency. She also reports cold feet with numbness in her extremities. As she is basically wheelchair bound, I cannot  assess whether she is having any claudication.  Toni [name] returns today for follow-up. She underwent venous and arterial lower external Dopplers. Arterial Dopplers were normal which showed no evidence of obstruction. Her venous Doppler showed no DVT. There was significant venous reflux in the left greater saphenous vein. This is an area where she has swelling and discomfort. This may be amenable to treatment. I increased her Lasix and she reports some improvement in her leg swelling. She's trying to elevate her feet and reports she is intolerant of wearing compression stockings.  02/03/2016  Toni [name] was seen today in the office for follow-up of recent hospitalization. She was seeing her neurologist and complained of left chest and left arm pain and was referred to the hospital. She ruled out for MI and was felt that her chest pain was on coronary. She underwent an echocardiogram which showed low-normal EF but was also found to be in A. fib with rapid ventricular response. Her A. fib is persistent at this time but the rate was elevated. She was started on Cardizem which helped improve her rate and was discharged on Cardizem CD 120 mg daily. Heart rate today is better controlled in the 70s. She reports her arm pain is improved and she said today that she "did not feel that it was her heart".  05/09/2016  Toni [name] was seen in the office for follow-up today. She reports improved heart rate control with heart rates between 50 and 80 on diltiazem. Over this will help with her mild cardiomyopathy with EF 50-55% on recent echo. She reports some increased fluid gain particularly in her abdomen. This may be due to some degree of right heart dysfunction. She's working with her neurologist for MS as well as her sleep apnea. There is a component of central sleep apnea and she is reportedly going to have additional sleep study to look for the need for BiPAP therapy. She denies any recurrent chest pain or left arm  pain.  02/14/2017  Toni [name] was seen today in follow-up. She was recently hospitalized. She said problems with increasing fluid gain. I increased her Lasix last time however when she was hospitalized she required high-dose Lasix and says she has she felt better with that. She subsequently been put on BiPAP therapy. She says her sleeping is improved significantly. She is concerned though recently she's had some increased weight gain. She feels like she may need an increasing dose of diuretic. Her last echo was in December 2017 which showed an EF of 55%. This is been stable. If this is some heart failure, may be related diastolic dysfunction however suspect is more likely related to autonomic dysfunction the setting of multiple sclerosis.  03/29/2017  Toni [name] returns today for follow-up. She continues to report lower extremity edema and abdominal swelling. We'll increase her Lasix however she has not noted significant weight loss. It is actually very difficult to estimate her weight because of her MS. We tried to place her on the scale today however she was unable to stand long enough to get  an accurate weight. She reports urination was not as good on 40 mg of Lasix but is currently taking 80 mg once daily and has better urination with that.  05/18/2017  Toni [name] was seen today in follow-up. She reports some improvement in her abdominal swelling and bloating. Check she feels like she might be somewhat dry now. Her weight is down to 246 pounds from 253 pounds. She is also working with the weight loss clinic at [facility]. She is interested in decreasing her diuretic which is reasonable. She asked again about probably trying to get back into sinus rhythm however she's been in persistent A. fib which is long-standing at this point for more than one or 2 years. Her last echo in December 2017 showed mild left atrial enlargement. Is not clear that she's symptomatic with her A. fib although does feel tired at  times. She says she's had some improvement with her sleep apnea therapy.  01/15/2018  Toni [name] returns today for follow-up.  She is continued to lose some weight.  Weight is down now to 218 pounds, significant improvement from her prior weight over 250 pounds.  She also had marked reduction in edema.  Her blood pressure is low normal at the moment and may benefit from reduction in her blood pressure medications.  Her sleep apnea has improved significantly according to Dr. Brett Fairy, especially her obstructive symptoms.  She still has central sleep apnea.  In addition she has secondary progressive MS and recently had worsening symptoms associated with that.  Heart rate was elevated today with regards to A. fib however that did come down after short period of time.  She again appears to be asymptomatic with her A. fib.  The main reason not to pursue sinus rhythm is the fact that she has been persistently in A. fib for well over 5 years, and the likelihood of reestablishing sinus rhythm is exceedingly low.  05/16/2018  Toni [name] is seen today in follow-up.  Her weight is down even further today to 204.  She has had recently some positional dizziness and low blood pressures.  This is likely due to the significant weight loss and I would recommend a decrease in her medications.  She says that she is breathing better.  Her sleep apnea has improved significantly according to her sleep doctor and she has having less symptoms related to her MS.  She is in permanent A. fib with controlled ventricular response.  03/10/2019  Toni [name] returns today for follow-up.  She is done very well overall.  She is continued to lose weight with Dr. Migdalia Dk work.  Weight now 186 pounds down from 210 when I last saw her.  This is amazing since she is essentially sedentary due to her MS.  She reports reasonable control of her A. fib.  She has had some decrease in her PA P requirements with regards to sleep apnea.  Blood pressure is  well controlled.  She has recently had some swelling of the lower extremities and her abdomen.  She wonders if this could be some heart failure.  I suspect the lower extremity swelling is most likely due to venous insufficiency.  I previously performed a venous ablation on the right lower extremity and she still has some venous insufficiency of the left lower extremity however in the common femoral vein not amenable to ablation.  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   01/28/2020  Toni [name] is seen today in  follow-up.  She continues to lose some weight related to her diet by Dr. Leafy Ro.  She today is in A. fib with RVR.  Blood pressure has run low recently.  She has not needed to use midodrine that frequently.  She has follow-up with Dr. Brett Fairy in July.  I recently had corresponded with her about findings that Ms. Tabor was hypoxic during the day as well as at night.  There are plans to both give her oxygen via BiPAP as well as possible oxygen during the day.  She has been referred to see Dr. Hermina Staggers at [facility] pulmonary.  PMHx:  Past Medical History:  Diagnosis Date  . Abnormality of gait 03/01/2015  . Anxiety   . Arthritis    "knees" (01/19/2016)  . Back pain   . Cancer ([place])    / RENAL CELL CARCINOMA - GETS CT SCAN EVERY 6 MONTHS TO WATCH   . Chronic pain    "nerve pain from the MS"  . Constipation   . Depression   . DVT (deep venous thrombosis) ([place]) 1983   "RLE; may have just been phlebitis; it was before the age of dopplers"  . 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 GERD (gastroesophageal reflux disease)   . HLD (hyperlipidemia)   . Hypotension   . Hypothyroidism   . Joint pain   . Multiple sclerosis ([place])    has had this may 1989-[facility] reg doc  . OSA on CPAP    bipap machine - SLEEP APNEA WORSE SINCE COVID IN 1/21 PER PATIENT SETTING HAS INCREASED FROM 9 TO 30   . Osteoarthritis   . PAF (paroxysmal  atrial fibrillation) ([place])    on coumadin; documented on monitor 06/2010  . Pneumonia 11/2011    Past Surgical History:  Procedure Laterality Date  . ANTERIOR CERVICAL DECOMP/DISCECTOMY FUSION  01/2004  . BACK SURGERY    . CARDIAC CATHETERIZATION  06/22/2010   normal coronary arteries, PAF  . CATARACT EXTRACTION, BILATERAL Bilateral   . CHILD BIRTHS     X2  . DILATATION & CURETTAGE/HYSTEROSCOPY WITH MYOSURE N/A 01/15/2020   Procedure: DILATATION & CURETTAGE/HYSTEROSCOPY WITH MYOSURE;  Surgeon: Arvella Nigh, MD;  Location: [facility];  Service: Gynecology;  Laterality: N/A;  pt in wheelchair-has MS  . DILATION AND CURETTAGE OF UTERUS    . INFUSION PUMP IMPLANTATION  ~ 2009   baclofen infusion in lower abd  . PAIN PUMP REVISION N/A 07/29/2014   Procedure: Baclofen pump replacement;  Surgeon: Erline Levine, MD;  Location: [facility];  Service: Neurosurgery;  Laterality: N/A;  Baclofen pump replacement  . TONSILLECTOMY AND ADENOIDECTOMY    . VARICOSE VEIN SURGERY  ~ 1968  . VENOUS ABLATION  12/16/2010   radiofreq ablation -Dr Elisabeth Cara and [facility]  . WISDOM TOOTH EXTRACTION      FAMHx:  Family History  Problem Relation Age of Onset  . Coronary artery disease Father        at age 40  . Hyperlipidemia Father   . Thyroid disease Father   . Coronary artery disease Maternal Grandmother   . Depression Maternal Grandmother   . Cancer Paternal Grandmother   . Depression Mother   . Sudden death Mother   . Anxiety disorder Mother   . Alcoholism Son     SOCHx:   reports that she quit smoking about 43 years ago. Her smoking use included cigarettes. She has  a 3.50 pack-year smoking history. She has never used smokeless tobacco. She reports previous alcohol use. She reports that she does not use drugs.  ALLERGIES:  Allergies  Allergen Reactions  . Chlorhexidine Hives  . Hydrocodone Nausea And Vomiting    Hydrocodone causes vomiting  . Oxycodone Nausea And Vomiting    Oral oxycodone causes  vomiting  . Chlorhexidine Gluconate Rash    Sores at site  . Zoloft [Sertraline] Hives, Swelling and Rash    ROS: Pertinent items noted in HPI and remainder of comprehensive ROS otherwise negative.  HOME MEDS: Current Outpatient Medications  Medication Sig Dispense Refill  . acetaminophen (TYLENOL) 500 MG tablet Take 500-1,000 mg by mouth every 6 (six) hours as needed for fever or headache (or pain).    Marland Kitchen ALPRAZolam (XANAX) 0.5 MG tablet Take 1 tablet (0.5 mg total) by mouth 2 (two) times daily as needed for anxiety. 30 tablet 0  . Ascorbic Acid (VITAMIN C) 500 MG CAPS Take 500 mg by mouth daily.    . baclofen (LIORESAL) 20 MG tablet Once to two tablets a day for breakthrough spasms (Patient taking differently: Take 20 mg by mouth 2 (two) times daily as needed for muscle spasms. ) 180 tablet 0  . buPROPion (WELLBUTRIN XL) 300 MG 24 hr tablet Take 1 tablet (300 mg total) by mouth daily. 30 tablet 0  . Calcium Carbonate-Vit D-Min (CALTRATE 600+D PLUS MINERALS) 600-800 MG-UNIT TABS Take 2 tablets by mouth 2 (two) times daily.    . cephALEXin (KEFLEX) 250 MG capsule Take 1 capsule by mouth every 8 (eight) hours.    . cetirizine (ZYRTEC) 10 MG tablet Take 10 mg by mouth daily as needed for allergies.     . Cholecalciferol (VITAMIN D) 2000 units tablet Take 1 tablet (2,000 Units total) by mouth 2 (two) times daily.  0  . Coenzyme Q10 (CO Q-10 PO) Take 1 tablet by mouth daily.    . corticotropin (ACTHAR) 80 UNIT/ML injectable gel Inject 1 mL (80 Units total) into the muscle every other day. For 10 days each month. 5 mL 5  . Cranberry 1000 MG CAPS Take 2 capsules by mouth 2 (two) times daily.     Marland Kitchen diltiazem (CARDIZEM) 60 MG tablet Take 1 tablet (60 mg total) by mouth every 6 (six) hours as needed for up to 7 days (HR sustained >110bpm). 28 tablet 0  . docusate sodium (COLACE) 100 MG capsule Take 1 capsule (100 mg total) by mouth daily. 10 capsule 0  . famotidine (PEPCID) 20 MG tablet Take 20 mg by  mouth at bedtime.   6  . fluconazole (DIFLUCAN) 150 MG tablet Take 150 mg by mouth every 3 (three) days.    . fluticasone (FLONASE) 50 MCG/ACT nasal spray Place 2 sprays into both nostrils daily as needed for allergies.     . furosemide (LASIX) 20 MG tablet Take 1 tablet by mouth daily, may take second dose if needed for excess edema 180 tablet 3  . gabapentin (NEURONTIN) 300 MG capsule TAKE 1 CAPSULE BY MOUTH UP  TO 4 TIMES DAILY (Patient taking differently: Take 300 mg by mouth 4 (four) times daily. ) 360 capsule 3  . GENERLAC 10 GM/15ML SOLN TAKE 15 ML BY MOUTH TWICE DAILY AS NEEDED (Patient taking differently: Take 10 g by mouth 2 (two) times daily as needed (constipation). ) 946 mL 1  . levothyroxine (SYNTHROID, LEVOTHROID) 75 MCG tablet Take 75 mcg by mouth daily.    Marland Kitchen  melatonin 3 MG TABS tablet Take 3 mg by mouth at bedtime.    . midodrine (PROAMATINE) 5 MG tablet Take 1 tablet (5 mg total) by mouth 3 (three) times daily with meals. 270 tablet 3  . Omega-3 Fatty Acids (FISH OIL PO) Take 1 capsule by mouth daily.    Marland Kitchen omeprazole (PRILOSEC) 20 MG capsule Take 20 mg by mouth daily.     . ondansetron (ZOFRAN) 4 MG tablet Take 4-8 mg by mouth every 8 (eight) hours as needed for nausea or vomiting.     . psyllium (METAMUCIL SMOOTH TEXTURE) 28 % packet Take 1 packet by mouth daily.     . simvastatin (ZOCOR) 10 MG tablet Take 10 mg by mouth every evening.     Marland Kitchen SYRINGE/NEEDLE, DISP, 1 ML (BD LUER-LOK SYRINGE) 25G X 5/8" 1 ML MISC Use as directed to inject  Acthar 10 each prn  . Tamsulosin HCl (FLOMAX) 0.4 MG CAPS Take 0.4 mg by mouth daily as needed (bladder emptying).     . warfarin (COUMADIN) 5 MG tablet Take 1/2 to 1 tablet by mouth daily as directed by coumadin clinic. 90 tablet 1  . diltiazem (CARDIZEM CD) 240 MG 24 hr capsule Take 1 capsule (240 mg total) by mouth daily. 90 capsule 3   No current facility-administered medications for this visit.    LABS/IMAGING: Results for orders placed  or performed in visit on 01/28/20 (from the past 48 hour(s))  POCT INR     Status: None   Collection Time: 01/28/20  2:39 PM  Result Value Ref Range   INR 2.4 2.0 - 3.0   *Note: Due to a large number of results and/or encounters for the requested time period, some results have not been displayed. A complete set of results can be found in Results Review.   No results found.  VITALS: BP (!) 110/54   Pulse (!) 132   Ht 5\' 2"  (1.575 m)   Wt 171 lb (77.6 kg)   SpO2 96%   BMI 31.28 kg/m   EXAM: General appearance: alert, no distress and moderately obese Neck: no carotid bruit, no JVD and thyroid not enlarged, symmetric, no tenderness/mass/nodules Lungs: clear to auscultation bilaterally Heart: irregularly irregular rhythm Abdomen: soft, non-tender; bowel sounds normal; no masses,  no organomegaly Extremities: edema Trace lower extremity Pulses: 2+ and symmetric Skin: Skin color, texture, turgor normal. No rashes or lesions Neurologic: Mental status: Alert, oriented, thought content appropriate Psych: Pleasant  EKG: A. fib with RVR at 132-personally reviewed  ASSESSMENT: 1. Permanent atrial fibrillation - 80% frequency of A. Fib 2. Morbid obesity 3. Obstructive sleep apnea on BIPAP 4. Multiple sclerosis 5. Chronic anticoagulation on warfarin - INR 2.3 today 6. Chronic venous insufficiency - +LGSV reflux 7. Hypertension-controlled 8. Noncardiac chest and left arm pain 9. Non-ischemic cardiomyopathy - EF 50%, improved to 55% (07/2016) 10. Acute on chronic diastolic congestive heart failure  PLAN: 1.   Toni [name] is in A. fib with RVR today and recently has had elevated rates.  I advised increasing her diltiazem CD to 240 mg daily.  Blood pressure should tolerated as it seems that she is using the midodrine less frequently.  I advised her if her systolic blood pressures below 90 or diastolic below 50 she should consider using the midodrine.  Further management of hypoxia and BiPAP  per her sleep specialist as well as pulmonary.  Follow-up with me in 3 months.  Pixie Casino, MD, [facility], FACP  [facility] Director of the Advanced Lipid Disorders &  Cardiovascular Risk Reduction Clinic Diplomate of the American Board of Clinical Lipidology Attending Cardiologist  Direct Dial: [phone]  Fax: [phone]  Website:  [name] Toni [name] 01/28/2020, 10:28 PM

## 2020-01-28 NOTE — Patient Instructions (Signed)
Medication Instructions:  Your physician has recommended you make the following change in your medication: INCREASE diltiazem to 240mg  daily  *If you need a refill on your cardiac medications before your next appointment, please call your pharmacy*   Follow-Up: At [facility], you and your health needs are our priority.  As part of our continuing mission to provide you with exceptional heart care, we have created designated Provider Care Teams.  These Care Teams include your primary Cardiologist (physician) and Advanced Practice Providers (APPs -  Physician Assistants and Nurse Practitioners) who all work together to provide you with the care you need, when you need it.  We recommend signing up for the patient portal called "MyChart".  Sign up information is provided on this After Visit Summary.  MyChart is used to connect with patients for Virtual Visits (Telemedicine).  Patients are able to view lab/test results, encounter notes, upcoming appointments, etc.  Non-urgent messages can be sent to your provider as well.   To learn more about what you can do with MyChart, go to [email or web address].    Your next appointment:   3 month(s)  The format for your next appointment:   In Person  Provider:   You may see Pixie Casino, MD or one of the following Advanced Practice Providers on your designated Care Team:    Almyra Deforest, PA-C  Fabian Sharp, PA-C or   Roby Lofts, [place]    Other Instructions

## 2020-01-30 DIAGNOSIS — R338 Other retention of urine: Secondary | ICD-10-CM | POA: Diagnosis not present

## 2020-01-30 DIAGNOSIS — G35 Multiple sclerosis: Secondary | ICD-10-CM | POA: Diagnosis not present

## 2020-02-02 ENCOUNTER — Other Ambulatory Visit: Payer: Self-pay | Admitting: Internal Medicine

## 2020-02-02 ENCOUNTER — Telehealth: Payer: Self-pay | Admitting: Pulmonary Disease

## 2020-02-02 ENCOUNTER — Encounter: Payer: Self-pay | Admitting: Pulmonary Disease

## 2020-02-02 ENCOUNTER — Other Ambulatory Visit: Payer: Self-pay

## 2020-02-02 ENCOUNTER — Telehealth: Payer: Self-pay | Admitting: Neurology

## 2020-02-02 ENCOUNTER — Ambulatory Visit (INDEPENDENT_AMBULATORY_CARE_PROVIDER_SITE_OTHER): Payer: Medicare Other | Admitting: Pulmonary Disease

## 2020-02-02 DIAGNOSIS — G4733 Obstructive sleep apnea (adult) (pediatric): Secondary | ICD-10-CM

## 2020-02-02 NOTE — Patient Instructions (Signed)
Contact Dr. Edwena Felty office to get copy of ONO, BiPAP compliance  Multiple attempts made to contact [employer] to get oximetry results unsuccessfully  Tentative follow-up in 3 months

## 2020-02-02 NOTE — Telephone Encounter (Signed)
Pt has called to report that Sherrilyn Rist A, MD is telling pt he has no idea as to why pt has been referred to him.  Dr Gala Murdoch is stating he has not received any reports re:her Bipap or anything.  Pt is asking for a call to resolve this

## 2020-02-02 NOTE — Progress Notes (Addendum)
Virtual Visit via Telephone Note  I connected with Toni [name] on 02/03/20 at  1:45 PM EDT by telephone and verified that I am speaking with the correct person using two identifiers.  Location: Patient: Toni [name] Provider: Adrian Prince discussed the limitations, risks, security and privacy concerns of performing an evaluation and management service by telephone and the availability of in person appointments. I also discussed with the patient that there may be a patient responsible charge related to this service. The patient expressed understanding and agreed to proceed.   History of Present Illness: Patient with a history of sleep apnea Was supposed to have review of oximetry study that was done today Several attempts to contact [employer] to get a download-unsuccessful  Has not been using BiPAP  She said she was told not to continue using it because it was keeping her up much more than it was helping  We needed the result of the oximetry to ascertain that she is not having problems with nocturnal desaturations  Had Covid around December/January More symptomatic with more apneic episodes noted on the download  I do see only telephone conversations online about ONO completed on BiPAP 16/11    Observations/Objective: Not feeling well at the present time, bladder infection being treated  Assessment and Plan: History of central sleep apnea  Currently using oxygen supplementation  Yet to be able to review oximetry  Follow Up Instructions:  Contact Dr. Edwena Felty office to get a copy of most recent download Follow-up with medical supply company to get a copy of oximetry that was performed    I discussed the assessment and treatment plan with the patient. The patient was provided an opportunity to ask questions and all were answered. The patient agreed with the plan and demonstrated an understanding of the instructions.   The patient was advised to call back or seek an  in-person evaluation if the symptoms worsen or if the condition fails to improve as anticipated.  I provided 16 minutes of non-face-to-face time during this encounter.   Laurin Coder, MD  Addendum:   BiPAP compliance prior to transitioning to oxygen supplementation  Addendum: Telephone visit occurred on 02/02/2020 I placed a call to the patient from the office, she received the call at her home.

## 2020-02-02 NOTE — Telephone Encounter (Signed)
Can we contact Dr. Edwena Felty office to obtain overnight oximetry result, compliance result on BiPAP that they have on record  Patient had a telephone visit today and was bothered by her son having any records so far  Attempts made multiple times to contact [employer] to get an oximetry download unsuccessfully

## 2020-02-02 NOTE — Telephone Encounter (Signed)
Call placed to Dr. Edwena Felty office regarding Dr. Colman Cater request for ONO results, compliance result on bipap.  Left a detailed message for MR of the office and requested that the results be faxed to our office. The patient name and dob were left as well as the phone and fax numbers.

## 2020-02-03 ENCOUNTER — Ambulatory Visit (INDEPENDENT_AMBULATORY_CARE_PROVIDER_SITE_OTHER): Payer: Medicare Other | Admitting: Pharmacist

## 2020-02-03 DIAGNOSIS — Z7901 Long term (current) use of anticoagulants: Secondary | ICD-10-CM | POA: Diagnosis not present

## 2020-02-03 DIAGNOSIS — I4821 Permanent atrial fibrillation: Secondary | ICD-10-CM

## 2020-02-03 LAB — POCT INR: INR: 2.1 (ref 2.0–3.0)

## 2020-02-03 NOTE — Telephone Encounter (Signed)
The last overnight oximetry we completed and ordered for the patient was march 2021. She completed and it did indicate oxygen was needed with her BiPAP. However due to medical reasoning the patient was unable to come into the sleep lab and completed the necessary sleep study to have her qualified. Per Dr Dohmeier she has discussed with Dr Debara Pickett and was hoping cardiovascular could get the oxygen approved. When the patient saw Dr Ander Slade at [facility] pulmonology, he ordered 5/14 for the patient to have ONO on room air completed. In regards to whether or not that was completed I am unsure since this was not completed through our office. I will forward this information to Dr Debara Pickett for him to review and address if oxygen can be ordered and covered under insurance from cardiac standpoint. My understanding was this was who she was going to get it through (or attempt to get it through.)

## 2020-02-03 NOTE — Telephone Encounter (Signed)
I called Toni [name]. She reports that Dr. Brett Fairy was supposed to send a referral to either cardiology or pulmonology to get her nocturnal oxygen ordered. She saw Dr. Ander Slade virtually yesterday and he had not received ONO results or a bipap download from our office. Toni [name] was displeased with this visit and was wondering if Dr. Brett Fairy would be agreeable to her seeing Dr. Debara Pickett, her cardiologist, for nocturnal O2 orders. There is a referral from April already in Epic to Dr. Debara Pickett for nocturnal O2 orders. I advised Toni [name] that I would let Dr. Brett Fairy and Myriam Jacobson, RN know of this and for follow up. Toni [name] verbalized understanding.

## 2020-02-04 NOTE — Telephone Encounter (Signed)
I am convinced she needs oxygen-  and if cardiology or pulmonology colleagues can order it, it would be best.  OUR privileges to prescribe oxygen through a sleep clinic are restricted to  using oxygen during a sleep study and proving it it is needed  beyond PAP therapy. We cannot order oxygen through another pathway and Toni [name] cannot come in for in lab sleep.

## 2020-02-06 ENCOUNTER — Other Ambulatory Visit: Payer: Self-pay | Admitting: Gynecologic Oncology

## 2020-02-06 ENCOUNTER — Encounter: Payer: Self-pay | Admitting: Gynecologic Oncology

## 2020-02-06 ENCOUNTER — Other Ambulatory Visit: Payer: Self-pay

## 2020-02-06 ENCOUNTER — Inpatient Hospital Stay: Payer: Medicare Other | Attending: Gynecologic Oncology | Admitting: Gynecologic Oncology

## 2020-02-06 VITALS — BP 124/62 | HR 52 | Temp 98.4°F | Ht 62.0 in

## 2020-02-06 DIAGNOSIS — F329 Major depressive disorder, single episode, unspecified: Secondary | ICD-10-CM | POA: Diagnosis not present

## 2020-02-06 DIAGNOSIS — N8502 Endometrial intraepithelial neoplasia [EIN]: Secondary | ICD-10-CM

## 2020-02-06 DIAGNOSIS — K219 Gastro-esophageal reflux disease without esophagitis: Secondary | ICD-10-CM | POA: Insufficient documentation

## 2020-02-06 DIAGNOSIS — E039 Hypothyroidism, unspecified: Secondary | ICD-10-CM | POA: Diagnosis not present

## 2020-02-06 DIAGNOSIS — G4733 Obstructive sleep apnea (adult) (pediatric): Secondary | ICD-10-CM | POA: Insufficient documentation

## 2020-02-06 DIAGNOSIS — I480 Paroxysmal atrial fibrillation: Secondary | ICD-10-CM | POA: Diagnosis not present

## 2020-02-06 DIAGNOSIS — M1990 Unspecified osteoarthritis, unspecified site: Secondary | ICD-10-CM | POA: Insufficient documentation

## 2020-02-06 DIAGNOSIS — G35 Multiple sclerosis: Secondary | ICD-10-CM | POA: Diagnosis not present

## 2020-02-06 DIAGNOSIS — Z7901 Long term (current) use of anticoagulants: Secondary | ICD-10-CM | POA: Diagnosis not present

## 2020-02-06 DIAGNOSIS — F419 Anxiety disorder, unspecified: Secondary | ICD-10-CM | POA: Insufficient documentation

## 2020-02-06 DIAGNOSIS — Z79899 Other long term (current) drug therapy: Secondary | ICD-10-CM | POA: Diagnosis not present

## 2020-02-06 DIAGNOSIS — E785 Hyperlipidemia, unspecified: Secondary | ICD-10-CM | POA: Diagnosis not present

## 2020-02-06 NOTE — Progress Notes (Signed)
GYNECOLOGIC ONCOLOGY NEW PATIENT CONSULTATION   Patient Name: Toni [name]  Patient Age: 74 y.o. Date of Service: 6/18 Referring Provider: Dr. Radene Knee  Primary Care Provider: Prince Solian, MD Consulting Provider: Jeral Pinch, MD   Assessment/Plan:  Postmenopausal patient with complex medical history recently diagnosed with complex atypical hyperplasia.  I reviewed pathology report with the patient as well as her recent ultrasound.  Confirmed her surgical pathology showed complex atypical hyperplasia within a polyp.  Although this may be confined to the polyp, since it appears that there was not sufficient endometrial tissue sampled, I cannot say that her endometrial lining is not involved.  We reviewed the diagnosis of complex atypical hyperplasia (CAH) and the treatment options, including medical management (Mirena IUD or progesterone PO) or hysterectomy.  Given her multiple medical co-morbidities, she is not a suitable surgical candidate at this time, and medical management options are recommended for disease stabilization and reduction of risk in development of endometrial cancer. We discussed that endometrial cancer is detected in about 40% of final uterine pathology specimens from patients with CAH.  For this reason, I recommend proceeding with treatment as well as D&C to determine if endometrium is involved.  We reviewed the role of progesterone therapy and the effect on preneoplastic lesions, believed to include induction of apoptosis in addition to tissue sloughing during withdrawal bleeding.  Activation of the progesterone receptors is believed to lead stromal decidualization and thinning of the lining.  We reviewed the 3 most studied options, to include levonorgesterol IUD (50mcg/d), oral medorxyprogesterone acetate 10mg  daily or cyclically 78-29 days per month, or oral megesterol acetate 40-200mg  per day.    She understands that all options have few side effects, most common  being infrequent edema, GI disturbances, and thromboembolic events), but that local progesterone through IUD may have a stronger effect on the endometrium with less systemic side effects.  Furthermore, studies demonstrate that 80-90% of women will have regression of their hyperplasia with progesterone use.    With IUD use, we anticipate that the average duration to regression is 4.5 months, with all cases anticipated to have regression by 9 months.     Given her comorbidities and my desire to avoid any increased thromboembolic risk as well as edema, I recommend that we proceed with Mirena IUD placement.  I would like to do this in conjunction with a D&C under light anesthesia in the operating room.  The patient and her family are amenable to this.  For monitoring, there is no recommend standard of care.  We will base her surveillance on GOG 224 protocol.  This will include EMB at 3 months following initiation of treatment.  EMBs will be performed at 3 to 6 month intervals, until a minimum of 3 negative biopsy results are obtained, after which sampling frequeny may then be yearly or until new abnormal uterine bleeding develops.  If persistence of CAH is noted by 9 months of treatment, then we will discuss additional agents or fitness/readiness for surgery.    Plan will be for surgery on June 28 with D&C and Mirena IUD placement, possible hysteroscopy. The risks of surgery were discussed in detail and she understands these to include infection; injury to adjacent organs such as bowel, bladder, blood vessels, ureters and nerves; bleeding which may require blood transfusion; anesthesia risk; thromboembolic events; possible death; unforeseen complications; possible need for re-exploration; medical complications such as heart attack, stroke, pleural effusion and pneumonia; and, if full lymphadenectomy is performed the risk of  lymphedema and lymphocyst. The patient will receive DVT and antibiotic prophylaxis as  indicated. She voiced a clear understanding. She had the opportunity to ask questions. Perioperative instructions were reviewed with her. Prescriptions for post-op medications were sent to her pharmacy of choice.  A copy of this note was sent to the patient's referring provider.   22 minutes of total time was spent for this patient encounter, including preparation, face-to-face counseling with the patient and coordination of care, and documentation of the encounter.  Jeral Pinch, MD  Division of Gynecologic Oncology  Department of Obstetrics and Gynecology  [facility]  ___________________________________________  Chief Complaint: Chief Complaint  Patient presents with  . Complex atypical endometrial hyperplasia    New Patient    History of Present Illness:  Toni [name] is a 74 y.o. y.o. female who is seen in consultation at the request of Dr. Radene Knee for an evaluation of complex atypical hyperplasia.  Patient initially presented for evaluation secondary to white vaginal discharge.  She denies any vaginal bleeding, pelvic pain or cramping.  Pelvic ultrasound noted a cystic right adnexal mass measuring 3.5 cm that has been present for many years and similar in size.  Endometrial lining was noted to be thickened and on saline infusion, large endometrial polyp was visualized.  Patient was taken for hysteroscopy with polypectomy and D&C with Dr. Radene Knee on 5/27.  A fundal polyp was noted within the cavity on hysteroscopy.  Patient denies any further discharge since after seeing Dr. Radene Knee.    The patient's work-up was complicated by acute urinary retention with a 100 cc of urine noted in her bladder on pelvic ultrasound.  The patient had a Foley catheter placed which was subsequently removed after 2 visits to the emergency department secondary to bleeding that began and then likely clot occluding the catheter.  She follows with urology who recommended indwelling  catheter but the patient and her family have preferred to use in and out catheterization as needed.  She has done this several times in the recent couple of weeks with 350 cc in her bladder at the most.  She uses appear awake at night.  Patient presents with her daughter and husband today.  She has multiple sclerosis diagnosed in 1989.  She had an exacerbation in December right around the time that she contracted Covid.  Her exacerbations usually include weakness on the right side, muscle spasms, slowed speech, and vision issues.  She had somewhat limited mobility previously but since December cannot transfer herself or do much of the activity she had previously been able to do.  At home, she uses a lift and is able to do some exercise and bear weight with the left.  She has a baclofen pump that is implanted on her right side to help with muscle spasms.  Her medical history is also complicated by obstructive sleep apnea for which she uses a BiPAP at night and atrial fibrillation for which she is on lifelong anticoagulation.  She endorses several months of hot flashes which she reports as dry heat, not associated with any sweating.  PAST MEDICAL HISTORY:  Past Medical History:  Diagnosis Date  . Abnormality of gait 03/01/2015  . Anxiety   . Arthritis    "knees" (01/19/2016)  . Back pain   . Cancer ([place])    / RENAL CELL CARCINOMA - GETS CT SCAN EVERY 6 MONTHS TO WATCH   . Chronic pain    "nerve pain from the MS"  .  Complex atypical endometrial hyperplasia   . Constipation   . Depression   . DVT (deep venous thrombosis) ([place]) 1983   "RLE; may have just been phlebitis; it was before the age of dopplers"  . 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 GERD (gastroesophageal reflux disease)   . HLD (hyperlipidemia)   . Hypotension   . Hypothyroidism   . Joint pain   . Multiple sclerosis ([place])    has had this may 1989-[facility]me[facility] reg doc  . OSA on  CPAP    bipap machine - SLEEP APNEA WORSE SINCE COVID IN 1/21 PER PATIENT SETTING HAS INCREASED FROM 9 TO 30   . Osteoarthritis   . PAF (paroxysmal atrial fibrillation) ([place])    on coumadin; documented on monitor 06/2010  . Pneumonia 11/2011  . Sleep apnea      PAST SURGICAL HISTORY:  Past Surgical History:  Procedure Laterality Date  . ANTERIOR CERVICAL DECOMP/DISCECTOMY FUSION  01/2004  . CARDIAC CATHETERIZATION  06/22/2010   normal coronary arteries, PAF  . CATARACT EXTRACTION, BILATERAL Bilateral   . CHILD BIRTHS     X2  . DILATATION & CURETTAGE/HYSTEROSCOPY WITH MYOSURE N/A 01/15/2020   Procedure: DILATATION & CURETTAGE/HYSTEROSCOPY WITH MYOSURE;  Surgeon: Arvella Nigh, MD;  Location: [facility];  Service: Gynecology;  Laterality: N/A;  pt in wheelchair-has MS  . DILATION AND CURETTAGE OF UTERUS    . INFUSION PUMP IMPLANTATION  ~ 2009   baclofen infusion in lower abd  . PAIN PUMP REVISION N/A 07/29/2014   Procedure: Baclofen pump replacement;  Surgeon: Erline Levine, MD;  Location: [facility];  Service: Neurosurgery;  Laterality: N/A;  Baclofen pump replacement  . TONSILLECTOMY AND ADENOIDECTOMY    . VARICOSE VEIN SURGERY  ~ 1968  . VENOUS ABLATION  12/16/2010   radiofreq ablation -Dr Elisabeth Cara and [facility]  . WISDOM TOOTH EXTRACTION      OB/GYN HISTORY:  OB History  Gravida Para Term Preterm AB Living  2            SAB TAB Ectopic Multiple Live Births               # Outcome Date GA Lbr Len/2nd Weight Sex Delivery Anes PTL Lv  2 Gravida           1 Gravida             No LMP recorded. Patient is postmenopausal.  Age at menarche: 59 Age at menopause: 83 Hx of HRT: Denies Hx of STDs: Denies Last pap: 2019, normal History of abnormal pap smears: Denies  SCREENING STUDIES:  Last mammogram: 2020  Last colonoscopy: Cologuard Last bone mineral density: 2019  MEDICATIONS: Outpatient Encounter Medications as of 02/06/2020  Medication Sig  . acetaminophen (TYLENOL) 500 MG  tablet Take 500-1,000 mg by mouth every 6 (six) hours as needed for fever or headache (or pain).  . Ascorbic Acid (VITAMIN C) 500 MG CAPS Take 500 mg by mouth daily.  . baclofen (LIORESAL) 20 MG tablet Once to two tablets a day for breakthrough spasms (Patient taking differently: Take 20 mg by mouth 2 (two) times daily as needed for muscle spasms. )  . buPROPion (WELLBUTRIN XL) 300 MG 24 hr tablet Take 1 tablet (300 mg total) by mouth daily.  . Calcium Carbonate-Vit D-Min (CALTRATE 600+D PLUS MINERALS) 600-800 MG-UNIT TABS Take 2 tablets by mouth 2 (two) times daily.  Marland Kitchen  cetirizine (ZYRTEC) 10 MG tablet Take 10 mg by mouth daily as needed for allergies.   . Cholecalciferol (VITAMIN D) 2000 units tablet Take 1 tablet (2,000 Units total) by mouth 2 (two) times daily.  . Coenzyme Q10 (CO Q-10 PO) Take 1 tablet by mouth daily.  . corticotropin (ACTHAR) 80 UNIT/ML injectable gel Inject 1 mL (80 Units total) into the muscle every other day. For 10 days each month.  . Cranberry 1000 MG CAPS Take 2 capsules by mouth 2 (two) times daily.   Marland Kitchen diltiazem (CARDIZEM CD) 240 MG 24 hr capsule Take 1 capsule (240 mg total) by mouth daily.  Marland Kitchen docusate sodium (COLACE) 100 MG capsule Take 1 capsule (100 mg total) by mouth daily.  . famotidine (PEPCID) 20 MG tablet Take 20 mg by mouth at bedtime.   . fluconazole (DIFLUCAN) 150 MG tablet Take 150 mg by mouth every 3 (three) days.  . fluticasone (FLONASE) 50 MCG/ACT nasal spray Place 2 sprays into both nostrils daily as needed for allergies.   . furosemide (LASIX) 20 MG tablet Take 1 tablet by mouth daily, may take second dose if needed for excess edema  . gabapentin (NEURONTIN) 300 MG capsule TAKE 1 CAPSULE BY MOUTH UP  TO 4 TIMES DAILY (Patient taking differently: Take 300 mg by mouth 4 (four) times daily. )  . GENERLAC 10 GM/15ML SOLN TAKE 15 ML BY MOUTH TWICE DAILY AS NEEDED (Patient taking differently: Take 10 g by mouth 2 (two) times daily as needed (constipation). )   . levothyroxine (SYNTHROID, LEVOTHROID) 75 MCG tablet Take 75 mcg by mouth daily.  . melatonin 3 MG TABS tablet Take 3 mg by mouth at bedtime.  . midodrine (PROAMATINE) 5 MG tablet Take 1 tablet (5 mg total) by mouth 3 (three) times daily with meals.  . nitrofurantoin, macrocrystal-monohydrate, (MACROBID) 100 MG capsule Take 100 mg by mouth 2 (two) times daily.  . Omega-3 Fatty Acids (FISH OIL PO) Take 1 capsule by mouth daily.  Marland Kitchen omeprazole (PRILOSEC) 20 MG capsule Take 20 mg by mouth daily.   . ondansetron (ZOFRAN) 4 MG tablet Take 4-8 mg by mouth every 8 (eight) hours as needed for nausea or vomiting.   . psyllium (METAMUCIL SMOOTH TEXTURE) 28 % packet Take 1 packet by mouth daily.   . simvastatin (ZOCOR) 10 MG tablet Take 10 mg by mouth every evening.   Marland Kitchen SYRINGE/NEEDLE, DISP, 1 ML (BD LUER-LOK SYRINGE) 25G X 5/8" 1 ML MISC Use as directed to inject  Acthar  . Tamsulosin HCl (FLOMAX) 0.4 MG CAPS Take 0.4 mg by mouth daily as needed (bladder emptying).   . warfarin (COUMADIN) 5 MG tablet Take 1/2 to 1 tablet by mouth daily as directed by coumadin clinic.  Marland Kitchen diltiazem (CARDIZEM) 60 MG tablet Take 1 tablet (60 mg total) by mouth every 6 (six) hours as needed for up to 7 days (HR sustained >110bpm).  . [DISCONTINUED] cephALEXin (KEFLEX) 250 MG capsule Take 1 capsule by mouth every 8 (eight) hours. (Patient not taking: Reported on 02/06/2020)   No facility-administered encounter medications on file as of 02/06/2020.    ALLERGIES:  Allergies  Allergen Reactions  . Chlorhexidine Hives  . Hydrocodone Nausea And Vomiting    Hydrocodone causes vomiting  . Oxycodone Nausea And Vomiting    Oral oxycodone causes vomiting  . Chlorhexidine Gluconate Rash    Sores at site  . Zoloft [Sertraline] Hives, Swelling and Rash     FAMILY HISTORY:  Family History  Problem Relation Age of Onset  . Coronary artery disease Father        at age 46  . Hyperlipidemia Father   . Thyroid disease Father   .  Coronary artery disease Maternal Grandmother   . Depression Maternal Grandmother   . Cancer Paternal Grandmother        breast  . Depression Mother   . Sudden death Mother   . Anxiety disorder Mother   . Alcoholism Son      SOCIAL HISTORY:    Social Connections:   . Frequency of Communication with Friends and Family:   . Frequency of Social Gatherings with Friends and Family:   . Attends Religious Services:   . Active Member of Clubs or Organizations:   . Attends Archivist Meetings:   Marland Kitchen Marital Status:     REVIEW OF SYSTEMS:  Reports abdominal distention, nausea, hot flashes Denies appetite changes, fevers, chills, fatigue, unexplained weight changes. Denies hearing loss, neck lumps or masses, mouth sores, ringing in ears or voice changes. Denies cough or wheezing.  Denies shortness of breath. Denies chest pain or palpitations. Denies leg swelling. Denies abdominal pain, blood in stools, constipation, diarrhea, vomiting, or early satiety. Denies pain with intercourse, dysuria, frequency, hematuria or incontinence. Denies pelvic pain, vaginal bleeding or vaginal discharge.   Denies joint pain, back pain or muscle pain/cramps. Denies itching, rash, or wounds. Denies dizziness, headaches, numbness or seizures. Denies swollen lymph nodes or glands, denies easy bruising or bleeding. Denies anxiety, depression, confusion, or decreased concentration.  Physical Exam:  Vital Signs for this encounter:  Blood pressure 124/62, pulse (!) 52, temperature 98.4 F (36.9 C), temperature source Oral, height 5\' 2"  (1.575 m), SpO2 100 %. Body mass index is 31.28 kg/m. General: Alert, oriented, no acute distress.  HEENT: Normocephalic, atraumatic. Sclera anicteric.  Chest: Clear to auscultation bilaterally. No wheezes, rhonchi, or rales. Cardiovascular: Regular rate and rhythm, no murmurs, rubs, or gallops.  Abdomen: Obese. Normoactive bowel sounds. Soft, nondistended, nontender  to palpation. No masses or hepatosplenomegaly appreciated.  Extremities: In wheelchair during exam, lowe extremities somewhat cool to the touch, no edema.  GU: Deferred.  LABORATORY AND RADIOLOGIC DATA:  Outside medical records were reviewed to synthesize the above history, along with the history and physical obtained during the visit.   Lab Results  Component Value Date   WBC 8.0 01/15/2020   HGB 13.5 01/15/2020   HCT 42.1 01/15/2020   PLT 242 01/15/2020   GLUCOSE 98 01/15/2020   CHOL 111 03/19/2019   TRIG 53 03/19/2019   HDL 57 03/19/2019   LDLCALC 43 03/19/2019   ALT 29 01/15/2020   AST 30 01/15/2020   NA 140 01/15/2020   K 4.0 01/15/2020   CL 98 01/15/2020   CREATININE 0.34 (L) 01/15/2020   BUN 27 (H) 01/15/2020   CO2 34 (H) 01/15/2020   TSH 1.374 10/10/2019   INR 2.1 02/03/2020   HGBA1C 5.0 03/19/2019    Polypectomy on 5/27: Complex atypical hyperplasia.  Endometrial curettings: Scant endocervical type glandular tissue, insufficient endometrial tissue.  CT A/P on 08/04/2019: Uterus noted to be unremarkable, no suspicious adnexal masses.  Slight right ovarian prominence unchanged from prior.  No fluid or adenopathy noted.  Other findings not related to gynecologic organs as noted in CT report.  Pelvic ultrasound on 01/08/2020: [facility] Uterus measures 5.8 x 3.0 x 4.3 cm, thickened endometrial lining which measures 8 mm.  There is an  elongated cystic tubular structure without blood flow noted in the left adnexa measuring 3.7 cm x 9 mm, likely consistent with a hydrosalpinx.  No other adnexal masses seen.  19 mm polypoid appearing mass seen within the endometrial cavity.

## 2020-02-06 NOTE — H&P (View-Only) (Signed)
GYNECOLOGIC ONCOLOGY NEW PATIENT CONSULTATION   Patient Name: Toni [name]  Patient Age: 74 y.o. Date of Service: 6/18 Referring Provider: Dr. Radene Knee  Primary Care Provider: Prince Solian, MD Consulting Provider: Jeral Pinch, MD   Assessment/Plan:  Postmenopausal patient with complex medical history recently diagnosed with complex atypical hyperplasia.  I reviewed pathology report with the patient as well as her recent ultrasound.  Confirmed her surgical pathology showed complex atypical hyperplasia within a polyp.  Although this may be confined to the polyp, since it appears that there was not sufficient endometrial tissue sampled, I cannot say that her endometrial lining is not involved.  We reviewed the diagnosis of complex atypical hyperplasia (CAH) and the treatment options, including medical management (Mirena IUD or progesterone PO) or hysterectomy.  Given her multiple medical co-morbidities, she is not a suitable surgical candidate at this time, and medical management options are recommended for disease stabilization and reduction of risk in development of endometrial cancer. We discussed that endometrial cancer is detected in about 40% of final uterine pathology specimens from patients with CAH.  For this reason, I recommend proceeding with treatment as well as D&C to determine if endometrium is involved.  We reviewed the role of progesterone therapy and the effect on preneoplastic lesions, believed to include induction of apoptosis in addition to tissue sloughing during withdrawal bleeding.  Activation of the progesterone receptors is believed to lead stromal decidualization and thinning of the lining.  We reviewed the 3 most studied options, to include levonorgesterol IUD (55mcg/d), oral medorxyprogesterone acetate 10mg  daily or cyclically 81-27 days per month, or oral megesterol acetate 40-200mg  per day.    She understands that all options have few side effects, most common  being infrequent edema, GI disturbances, and thromboembolic events), but that local progesterone through IUD may have a stronger effect on the endometrium with less systemic side effects.  Furthermore, studies demonstrate that 80-90% of women will have regression of their hyperplasia with progesterone use.    With IUD use, we anticipate that the average duration to regression is 4.5 months, with all cases anticipated to have regression by 9 months.     Given her comorbidities and my desire to avoid any increased thromboembolic risk as well as edema, I recommend that we proceed with Mirena IUD placement.  I would like to do this in conjunction with a D&C under light anesthesia in the operating room.  The patient and her family are amenable to this.  For monitoring, there is no recommend standard of care.  We will base her surveillance on GOG 224 protocol.  This will include EMB at 3 months following initiation of treatment.  EMBs will be performed at 3 to 6 month intervals, until a minimum of 3 negative biopsy results are obtained, after which sampling frequeny may then be yearly or until new abnormal uterine bleeding develops.  If persistence of CAH is noted by 9 months of treatment, then we will discuss additional agents or fitness/readiness for surgery.    Plan will be for surgery on June 28 with D&C and Mirena IUD placement, possible hysteroscopy. The risks of surgery were discussed in detail and she understands these to include infection; injury to adjacent organs such as bowel, bladder, blood vessels, ureters and nerves; bleeding which may require blood transfusion; anesthesia risk; thromboembolic events; possible death; unforeseen complications; possible need for re-exploration; medical complications such as heart attack, stroke, pleural effusion and pneumonia; and, if full lymphadenectomy is performed the risk of  lymphedema and lymphocyst. The patient will receive DVT and antibiotic prophylaxis as  indicated. She voiced a clear understanding. She had the opportunity to ask questions. Perioperative instructions were reviewed with her. Prescriptions for post-op medications were sent to her pharmacy of choice.  A copy of this note was sent to the patient's referring provider.   22 minutes of total time was spent for this patient encounter, including preparation, face-to-face counseling with the patient and coordination of care, and documentation of the encounter.  Jeral Pinch, MD  Division of Gynecologic Oncology  Department of Obstetrics and Gynecology  [facility]  ___________________________________________  Chief Complaint: Chief Complaint  Patient presents with  . Complex atypical endometrial hyperplasia    New Patient    History of Present Illness:  Toni [name] is a 74 y.o. y.o. female who is seen in consultation at the request of Dr. Radene Knee for an evaluation of complex atypical hyperplasia.  Patient initially presented for evaluation secondary to white vaginal discharge.  She denies any vaginal bleeding, pelvic pain or cramping.  Pelvic ultrasound noted a cystic right adnexal mass measuring 3.5 cm that has been present for many years and similar in size.  Endometrial lining was noted to be thickened and on saline infusion, large endometrial polyp was visualized.  Patient was taken for hysteroscopy with polypectomy and D&C with Dr. Radene Knee on 5/27.  A fundal polyp was noted within the cavity on hysteroscopy.  Patient denies any further discharge since after seeing Dr. Radene Knee.    The patient's work-up was complicated by acute urinary retention with a 100 cc of urine noted in her bladder on pelvic ultrasound.  The patient had a Foley catheter placed which was subsequently removed after 2 visits to the emergency department secondary to bleeding that began and then likely clot occluding the catheter.  She follows with urology who recommended indwelling  catheter but the patient and her family have preferred to use in and out catheterization as needed.  She has done this several times in the recent couple of weeks with 350 cc in her bladder at the most.  She uses appear awake at night.  Patient presents with her daughter and husband today.  She has multiple sclerosis diagnosed in 1989.  She had an exacerbation in December right around the time that she contracted Covid.  Her exacerbations usually include weakness on the right side, muscle spasms, slowed speech, and vision issues.  She had somewhat limited mobility previously but since December cannot transfer herself or do much of the activity she had previously been able to do.  At home, she uses a lift and is able to do some exercise and bear weight with the left.  She has a baclofen pump that is implanted on her right side to help with muscle spasms.  Her medical history is also complicated by obstructive sleep apnea for which she uses a BiPAP at night and atrial fibrillation for which she is on lifelong anticoagulation.  She endorses several months of hot flashes which she reports as dry heat, not associated with any sweating.  PAST MEDICAL HISTORY:  Past Medical History:  Diagnosis Date  . Abnormality of gait 03/01/2015  . Anxiety   . Arthritis    "knees" (01/19/2016)  . Back pain   . Cancer ([place])    / RENAL CELL CARCINOMA - GETS CT SCAN EVERY 6 MONTHS TO WATCH   . Chronic pain    "nerve pain from the MS"  .  Complex atypical endometrial hyperplasia   . Constipation   . Depression   . DVT (deep venous thrombosis) ([place]) 1983   "RLE; may have just been phlebitis; it was before the age of dopplers"  . 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 GERD (gastroesophageal reflux disease)   . HLD (hyperlipidemia)   . Hypotension   . Hypothyroidism   . Joint pain   . Multiple sclerosis ([place])    has had this may 1989-[facility] reg doc  . OSA on  CPAP    bipap machine - SLEEP APNEA WORSE SINCE COVID IN 1/21 PER PATIENT SETTING HAS INCREASED FROM 9 TO 30   . Osteoarthritis   . PAF (paroxysmal atrial fibrillation) ([place])    on coumadin; documented on monitor 06/2010  . Pneumonia 11/2011  . Sleep apnea      PAST SURGICAL HISTORY:  Past Surgical History:  Procedure Laterality Date  . ANTERIOR CERVICAL DECOMP/DISCECTOMY FUSION  01/2004  . CARDIAC CATHETERIZATION  06/22/2010   normal coronary arteries, PAF  . CATARACT EXTRACTION, BILATERAL Bilateral   . CHILD BIRTHS     X2  . DILATATION & CURETTAGE/HYSTEROSCOPY WITH MYOSURE N/A 01/15/2020   Procedure: DILATATION & CURETTAGE/HYSTEROSCOPY WITH MYOSURE;  Surgeon: Arvella Nigh, MD;  Location: [facility];  Service: Gynecology;  Laterality: N/A;  pt in wheelchair-has MS  . DILATION AND CURETTAGE OF UTERUS    . INFUSION PUMP IMPLANTATION  ~ 2009   baclofen infusion in lower abd  . PAIN PUMP REVISION N/A 07/29/2014   Procedure: Baclofen pump replacement;  Surgeon: Erline Levine, MD;  Location: [place] [facility];  Service: Neurosurgery;  Laterality: N/A;  Baclofen pump replacement  . TONSILLECTOMY AND ADENOIDECTOMY    . VARICOSE VEIN SURGERY  ~ 1968  . VENOUS ABLATION  12/16/2010   radiofreq ablation -Dr Elisabeth Cara and [facility]  . WISDOM TOOTH EXTRACTION      OB/GYN HISTORY:  OB History  Gravida Para Term Preterm AB Living  2            SAB TAB Ectopic Multiple Live Births               # Outcome Date GA Lbr Len/2nd Weight Sex Delivery Anes PTL Lv  2 Gravida           1 Gravida             No LMP recorded. Patient is postmenopausal.  Age at menarche: 80 Age at menopause: 15 Hx of HRT: Denies Hx of STDs: Denies Last pap: 2019, normal History of abnormal pap smears: Denies  SCREENING STUDIES:  Last mammogram: 2020  Last colonoscopy: Cologuard Last bone mineral density: 2019  MEDICATIONS: Outpatient Encounter Medications as of 02/06/2020  Medication Sig  . acetaminophen (TYLENOL) 500 MG  tablet Take 500-1,000 mg by mouth every 6 (six) hours as needed for fever or headache (or pain).  . Ascorbic Acid (VITAMIN C) 500 MG CAPS Take 500 mg by mouth daily.  . baclofen (LIORESAL) 20 MG tablet Once to two tablets a day for breakthrough spasms (Patient taking differently: Take 20 mg by mouth 2 (two) times daily as needed for muscle spasms. )  . buPROPion (WELLBUTRIN XL) 300 MG 24 hr tablet Take 1 tablet (300 mg total) by mouth daily.  . Calcium Carbonate-Vit D-Min (CALTRATE 600+D PLUS MINERALS) 600-800 MG-UNIT TABS Take 2 tablets by mouth 2 (two) times daily.  Marland Kitchen  cetirizine (ZYRTEC) 10 MG tablet Take 10 mg by mouth daily as needed for allergies.   . Cholecalciferol (VITAMIN D) 2000 units tablet Take 1 tablet (2,000 Units total) by mouth 2 (two) times daily.  . Coenzyme Q10 (CO Q-10 PO) Take 1 tablet by mouth daily.  . corticotropin (ACTHAR) 80 UNIT/ML injectable gel Inject 1 mL (80 Units total) into the muscle every other day. For 10 days each month.  . Cranberry 1000 MG CAPS Take 2 capsules by mouth 2 (two) times daily.   Marland Kitchen diltiazem (CARDIZEM CD) 240 MG 24 hr capsule Take 1 capsule (240 mg total) by mouth daily.  Marland Kitchen docusate sodium (COLACE) 100 MG capsule Take 1 capsule (100 mg total) by mouth daily.  . famotidine (PEPCID) 20 MG tablet Take 20 mg by mouth at bedtime.   . fluconazole (DIFLUCAN) 150 MG tablet Take 150 mg by mouth every 3 (three) days.  . fluticasone (FLONASE) 50 MCG/ACT nasal spray Place 2 sprays into both nostrils daily as needed for allergies.   . furosemide (LASIX) 20 MG tablet Take 1 tablet by mouth daily, may take second dose if needed for excess edema  . gabapentin (NEURONTIN) 300 MG capsule TAKE 1 CAPSULE BY MOUTH UP  TO 4 TIMES DAILY (Patient taking differently: Take 300 mg by mouth 4 (four) times daily. )  . GENERLAC 10 GM/15ML SOLN TAKE 15 ML BY MOUTH TWICE DAILY AS NEEDED (Patient taking differently: Take 10 g by mouth 2 (two) times daily as needed (constipation). )   . levothyroxine (SYNTHROID, LEVOTHROID) 75 MCG tablet Take 75 mcg by mouth daily.  . melatonin 3 MG TABS tablet Take 3 mg by mouth at bedtime.  . midodrine (PROAMATINE) 5 MG tablet Take 1 tablet (5 mg total) by mouth 3 (three) times daily with meals.  . nitrofurantoin, macrocrystal-monohydrate, (MACROBID) 100 MG capsule Take 100 mg by mouth 2 (two) times daily.  . Omega-3 Fatty Acids (FISH OIL PO) Take 1 capsule by mouth daily.  Marland Kitchen omeprazole (PRILOSEC) 20 MG capsule Take 20 mg by mouth daily.   . ondansetron (ZOFRAN) 4 MG tablet Take 4-8 mg by mouth every 8 (eight) hours as needed for nausea or vomiting.   . psyllium (METAMUCIL SMOOTH TEXTURE) 28 % packet Take 1 packet by mouth daily.   . simvastatin (ZOCOR) 10 MG tablet Take 10 mg by mouth every evening.   Marland Kitchen SYRINGE/NEEDLE, DISP, 1 ML (BD LUER-LOK SYRINGE) 25G X 5/8" 1 ML MISC Use as directed to inject  Acthar  . Tamsulosin HCl (FLOMAX) 0.4 MG CAPS Take 0.4 mg by mouth daily as needed (bladder emptying).   . warfarin (COUMADIN) 5 MG tablet Take 1/2 to 1 tablet by mouth daily as directed by coumadin clinic.  Marland Kitchen diltiazem (CARDIZEM) 60 MG tablet Take 1 tablet (60 mg total) by mouth every 6 (six) hours as needed for up to 7 days (HR sustained >110bpm).  . [DISCONTINUED] cephALEXin (KEFLEX) 250 MG capsule Take 1 capsule by mouth every 8 (eight) hours. (Patient not taking: Reported on 02/06/2020)   No facility-administered encounter medications on file as of 02/06/2020.    ALLERGIES:  Allergies  Allergen Reactions  . Chlorhexidine Hives  . Hydrocodone Nausea And Vomiting    Hydrocodone causes vomiting  . Oxycodone Nausea And Vomiting    Oral oxycodone causes vomiting  . Chlorhexidine Gluconate Rash    Sores at site  . Zoloft [Sertraline] Hives, Swelling and Rash     FAMILY HISTORY:  Family History  Problem Relation Age of Onset  . Coronary artery disease Father        at age 60  . Hyperlipidemia Father   . Thyroid disease Father   .  Coronary artery disease Maternal Grandmother   . Depression Maternal Grandmother   . Cancer Paternal Grandmother        breast  . Depression Mother   . Sudden death Mother   . Anxiety disorder Mother   . Alcoholism Son      SOCIAL HISTORY:    Social Connections:   . Frequency of Communication with Friends and Family:   . Frequency of Social Gatherings with Friends and Family:   . Attends Religious Services:   . Active Member of Clubs or Organizations:   . Attends Archivist Meetings:   Marland Kitchen Marital Status:     REVIEW OF SYSTEMS:  Reports abdominal distention, nausea, hot flashes Denies appetite changes, fevers, chills, fatigue, unexplained weight changes. Denies hearing loss, neck lumps or masses, mouth sores, ringing in ears or voice changes. Denies cough or wheezing.  Denies shortness of breath. Denies chest pain or palpitations. Denies leg swelling. Denies abdominal pain, blood in stools, constipation, diarrhea, vomiting, or early satiety. Denies pain with intercourse, dysuria, frequency, hematuria or incontinence. Denies pelvic pain, vaginal bleeding or vaginal discharge.   Denies joint pain, back pain or muscle pain/cramps. Denies itching, rash, or wounds. Denies dizziness, headaches, numbness or seizures. Denies swollen lymph nodes or glands, denies easy bruising or bleeding. Denies anxiety, depression, confusion, or decreased concentration.  Physical Exam:  Vital Signs for this encounter:  Blood pressure 124/62, pulse (!) 52, temperature 98.4 F (36.9 C), temperature source Oral, height 5\' 2"  (1.575 m), SpO2 100 %. Body mass index is 31.28 kg/m. General: Alert, oriented, no acute distress.  HEENT: Normocephalic, atraumatic. Sclera anicteric.  Chest: Clear to auscultation bilaterally. No wheezes, rhonchi, or rales. Cardiovascular: Regular rate and rhythm, no murmurs, rubs, or gallops.  Abdomen: Obese. Normoactive bowel sounds. Soft, nondistended, nontender  to palpation. No masses or hepatosplenomegaly appreciated.  Extremities: In wheelchair during exam, lowe extremities somewhat cool to the touch, no edema.  GU: Deferred.  LABORATORY AND RADIOLOGIC DATA:  Outside medical records were reviewed to synthesize the above history, along with the history and physical obtained during the visit.   Lab Results  Component Value Date   WBC 8.0 01/15/2020   HGB 13.5 01/15/2020   HCT 42.1 01/15/2020   PLT 242 01/15/2020   GLUCOSE 98 01/15/2020   CHOL 111 03/19/2019   TRIG 53 03/19/2019   HDL 57 03/19/2019   LDLCALC 43 03/19/2019   ALT 29 01/15/2020   AST 30 01/15/2020   NA 140 01/15/2020   K 4.0 01/15/2020   CL 98 01/15/2020   CREATININE 0.34 (L) 01/15/2020   BUN 27 (H) 01/15/2020   CO2 34 (H) 01/15/2020   TSH 1.374 10/10/2019   INR 2.1 02/03/2020   HGBA1C 5.0 03/19/2019    Polypectomy on 5/27: Complex atypical hyperplasia.  Endometrial curettings: Scant endocervical type glandular tissue, insufficient endometrial tissue.  CT A/P on 08/04/2019: Uterus noted to be unremarkable, no suspicious adnexal masses.  Slight right ovarian prominence unchanged from prior.  No fluid or adenopathy noted.  Other findings not related to gynecologic organs as noted in CT report.  Pelvic ultrasound on 01/08/2020: [facility] Uterus measures 5.8 x 3.0 x 4.3 cm, thickened endometrial lining which measures 8 mm.  There is an  elongated cystic tubular structure without blood flow noted in the left adnexa measuring 3.7 cm x 9 mm, likely consistent with a hydrosalpinx.  No other adnexal masses seen.  19 mm polypoid appearing mass seen within the endometrial cavity.

## 2020-02-06 NOTE — Patient Instructions (Signed)
Preparing for your Surgery  Plan for surgery on February 16, 2020 with Dr. Jeral Pinch at [facility] will be scheduled for a dilation and curettage of the uterus with Mirena IUD placement, possible hysteroscopy.   You can stay on your anticoagulation (Coumadin).  Pre-operative Testing -You will receive a phone call from presurgical testing at [facility] to arrange for a pre-operative appointment over the phone, lab appointment, and COVID test. The COVID test normally happens 3 days prior to the surgery and they ask that you self quarantine after the test up until surgery to decrease chance of exposure.  -Bring your insurance card, copy of an advanced directive if applicable, medication list  -At that visit, you will be asked to sign a consent for a possible blood transfusion in case a transfusion becomes necessary during surgery.  The need for a blood transfusion is rare but having consent is a necessary part of your care.     Day Before Surgery at [facility] will be advised you can have clear liquids after midnight and up until 3 hours before your surgery.    Your role in recovery Your role is to become active as soon as directed by your doctor, while still giving yourself time to heal.  Rest when you feel tired. You will be asked to do the following in order to speed your recovery:  - Cough and breathe deeply. This helps to clear and expand your lungs and can prevent pneumonia after surgery.  - [place]. Do mild physical activity. Walking or moving your legs help your circulation and body functions return to normal. Do not try to get up or walk alone the first time after surgery.   -If you develop swelling on one leg or the other, pain in the back of your leg, redness/warmth in one of your legs, please call the office or go to the Emergency Room to have a doppler to rule out a blood clot. For shortness of breath, chest pain-seek care in the  Emergency Room as soon as possible. - Actively manage your pain. Managing your pain lets you move in comfort. We will ask you to rate your pain on a scale of zero to 10. It is your responsibility to tell your doctor or nurse where and how much you hurt so your pain can be treated.  Special Considerations -If you are diabetic, you may be placed on insulin after surgery to have closer control over your blood sugars to promote healing and recovery.  This does not mean that you will be discharged on insulin.  If applicable, your oral antidiabetics will be resumed when you are tolerating a solid diet.  -FMLA forms can be faxed to [phone] and please allow 5-7 business days for completion.  Risks of Surgery Risks of surgery are low but include bleeding, infection, damage to surrounding structures, re-operation, blood clots, and very rarely death.  Blood Transfusion Information (For the consent to be signed before surgery)  We will be checking your blood type before surgery so in case of emergencies, we will know what type of blood you would need.                                            WHAT IS A BLOOD TRANSFUSION?  A transfusion is the replacement of blood or  some of its parts. Blood is made up of multiple cells which provide different functions.  Red blood cells carry oxygen and are used for blood loss replacement.  White blood cells fight against infection.  Platelets control bleeding.  Plasma helps clot blood.  Other blood products are available for specialized needs, such as hemophilia or other clotting disorders. BEFORE THE TRANSFUSION  Who gives blood for transfusions?   You may be able to donate blood to be used at a later date on yourself (autologous donation).  Relatives can be asked to donate blood. This is generally not any safer than if you have received blood from a stranger. The same precautions are taken to ensure safety when a relative's blood is donated.  Healthy  volunteers who are fully evaluated to make sure their blood is safe. This is blood bank blood. Transfusion therapy is the safest it has ever been in the practice of medicine. Before blood is taken from a donor, a complete history is taken to make sure that person has no history of diseases nor engages in risky social behavior (examples are intravenous drug use or sexual activity with multiple partners). The donor's travel history is screened to minimize risk of transmitting infections, such as malaria. The donated blood is tested for signs of infectious diseases, such as HIV and hepatitis. The blood is then tested to be sure it is compatible with you in order to minimize the chance of a transfusion reaction. If you or a relative donates blood, this is often done in anticipation of surgery and is not appropriate for emergency situations. It takes many days to process the donated blood. RISKS AND COMPLICATIONS Although transfusion therapy is very safe and saves many lives, the main dangers of transfusion include:   Getting an infectious disease.  Developing a transfusion reaction. This is an allergic reaction to something in the blood you were given. Every precaution is taken to prevent this. The decision to have a blood transfusion has been considered carefully by your caregiver before blood is given. Blood is not given unless the benefits outweigh the risks.  AFTER SURGERY INSTRUCTIONS  Return to work: 4-6 weeks if applicable  Activity: 1. Be up and out of the bed during the day.  Take a nap if needed.  You may walk up steps but be careful and use the hand rail.  Stair climbing will tire you more than you think, you may need to stop part way and rest.   2. No lifting or straining for 6 weeks over 10 pounds. No pushing, pulling, straining for 6 weeks.  3. No driving for 48 hours after surgery if you were cleared to drive before surgery.  Do not drive if you are taking narcotic pain medicine and  make sure that your reaction time has returned.   4. You can shower as soon as the next day after surgery. Shower daily.  Use soap and water on your incision and pat dry; don't rub.  No tub baths or submerging your body in water until cleared by your surgeon. If you have the soap that was given to you by pre-surgical testing that was used before surgery, you do not need to use it afterwards because this can irritate your incisions.   5. No sexual activity and nothing in the vagina for 4 weeks.  6. You may experience vaginal spotting after surgery or around the 6-8 week mark from surgery when the stitches at the top of the vagina begin  to dissolve.  The spotting is normal but if you experience heavy bleeding, call our office.  9. Take Tylenol first for pain and monitor your Tylenol intake to a max of 4,000 mg in a 24 hour period.   Diet: 1. Low sodium Heart Healthy Diet is recommended.  2. It is safe to use a laxative, such as Miralax or Colace, if you have difficulty moving your bowels.   Wound Care: 1. Keep clean and dry.  Shower daily.  Reasons to call the Doctor:  Fever - Oral temperature greater than 100.4 degrees Fahrenheit  Foul-smelling vaginal discharge  Difficulty urinating  Nausea and vomiting  Increased pain at the site of the incision that is unrelieved with pain medicine.  Difficulty breathing with or without chest pain  New calf pain especially if only on one side  Sudden, continuing increased vaginal bleeding with or without clots.   Contacts: For questions or concerns you should contact:  Dr. Jeral Pinch at [phone]  [name], NP at [phone]  After Hours: call [phone] and have the GYN Oncologist paged/contacted   Dilation and Curettage or Vacuum Curettage  Dilation and curettage (D&C) and vacuum curettage are minor procedures. A D&C involves stretching (dilation) the cervix and scraping (curettage) the inside lining of the uterus  (endometrium). During a D&C, tissue is gently scraped from the endometrium, starting from the top portion of the uterus down to the lowest part of the uterus (cervix). During a vacuum curettage, the lining and tissue in the uterus are removed with the use of gentle suction. Curettage may be performed to either diagnose or treat a problem. As a diagnostic procedure, curettage is performed to examine tissues from the uterus. A diagnostic curettage may be done if you have:  Irregular bleeding in the uterus.  Bleeding with the development of clots.  Spotting between menstrual periods.  Prolonged menstrual periods or other abnormal bleeding.  Bleeding after menopause.  No menstrual period (amenorrhea).  A change in size and shape of the uterus.  Abnormal endometrial cells discovered during a Pap test. As a treatment procedure, curettage may be performed for the following reasons:  Removal of an IUD (intrauterine device).  Removal of retained placenta after giving birth.  Abortion.  Miscarriage.  Removal of endometrial polyps.  Removal of uncommon types of noncancerous lumps (fibroids). Tell a health care provider about:  Any allergies you have, including allergies to prescribed medicine or latex.  All medicines you are taking, including vitamins, herbs, eye drops, creams, and over-the-counter medicines. This is especially important if you take any blood-thinning medicine. Bring a list of all of your medicines to your appointment.  Any problems you or family members have had with anesthetic medicines.  Any blood disorders you have.  Any surgeries you have had.  Your medical history and any medical conditions you have.  Whether you are pregnant or may be pregnant.  Recent vaginal infections you have had.  Recent menstrual periods, bleeding problems you have had, and what form of birth control (contraception) you use. What are the risks? Generally, this is a safe procedure.  However, problems may occur, including:  Infection.  Heavy vaginal bleeding.  Allergic reactions to medicines.  Damage to the cervix or other structures or organs.  Development of scar tissue (adhesions) inside the uterus, which can cause abnormal amounts of menstrual bleeding. This may make it harder to get pregnant in the future.  A hole (perforation) or puncture in the uterine wall. This is  rare. What happens before the procedure? Staying hydrated Follow instructions from your health care provider about hydration, which may include:  Up to 2 hours before the procedure - you may continue to drink clear liquids, such as water, clear fruit juice, black coffee, and plain tea. Eating and drinking restrictions Follow instructions from your health care provider about eating and drinking, which may include:  8 hours before the procedure - stop eating heavy meals or foods such as meat, fried foods, or fatty foods.  6 hours before the procedure - stop eating light meals or foods, such as toast or cereal.  6 hours before the procedure - stop drinking milk or drinks that contain milk.  2 hours before the procedure - stop drinking clear liquids. If your health care provider told you to take your medicine(s) on the day of your procedure, take them with only a sip of water. Medicines  Ask your health care provider about: ? Changing or stopping your regular medicines. This is especially important if you are taking diabetes medicines or blood thinners. ? Taking medicines such as aspirin and ibuprofen. These medicines can thin your blood. Do not take these medicines before your procedure if your health care provider instructs you not to.  You may be given antibiotic medicine to help prevent infection. General instructions  For 24 hours before your procedure, do not: ? Douche. ? Use tampons. ? Use medicines, creams, or suppositories in the vagina. ? Have sexual intercourse.  You may be  given a pregnancy test on the day of the procedure.  Plan to have someone take you home from the hospital or clinic.  You may have a blood or urine sample taken.  If you will be going home right after the procedure, plan to have someone with you for 24 hours. What happens during the procedure?  To reduce your risk of infection: ? Your health care team will wash or sanitize their hands. ? Your skin will be washed with soap.  An IV tube will be inserted into one of your veins.  You will be given one of the following: ? A medicine that numbs the area in and around the cervix (local anesthetic). ? A medicine to make you fall asleep (general anesthetic).  You will lie down on your back, with your feet in foot rests (stirrups).  The size and position of your uterus will be checked.  A lubricated instrument (speculum or Sims retractor) will be inserted into the back side of your vagina. The speculum will be used to hold apart the walls of your vagina so your health care provider can see your cervix.  A tool (tenaculum) will be attached to the lip of the cervix to stabilize it.  Your cervix will be softened and dilated. This may be done by: ? Taking a medicine. ? Having tapered dilators or thin rods (laminaria) or gradual widening instruments (tapered dilators) inserted into your cervix.  A small, sharp, curved instrument (curette) will be used to scrape a small amount of tissue or cells from the endometrium or cervical canal. In some cases, gentle suction is applied with the curette. The curette will then be removed. The cells will be taken to a lab for testing. The procedure may vary among health care providers and hospitals. What happens after the procedure?  You may have mild cramping, backache, pain, and light bleeding or spotting. You may pass small blood clots from your vagina.  You may have to wear compression  stockings. These stockings help to prevent blood clots and reduce  swelling in your legs.  Your blood pressure, heart rate, breathing rate, and blood oxygen level will be monitored until the medicines you were given have worn off. Summary  Dilation and curettage (D&C) involves stretching (dilation) the cervix and scraping (curettage) the inside lining of the uterus (endometrium).  After the procedure, you may have mild cramping, backache, pain, and light bleeding or spotting. You may pass small blood clots from your vagina.  Plan to have someone take you home from the hospital or clinic. This information is not intended to replace advice given to you by your health care provider. Make sure you discuss any questions you have with your health care provider. Document Revised: 07/20/2017 Document Reviewed: 04/23/2016 Elsevier Patient Education  [employer].   Intrauterine Device Insertion An intrauterine device (IUD) is a medical device that gets inserted into the uterus to prevent pregnancy. It is a small, T-shaped device that has one or two nylon strings hanging down from it. The strings hang out of the lower part of the uterus (cervix) to allow for future IUD removal. There are two types of IUDs available:  Copper IUD. This type of IUD has copper wire wrapped around it. Copper makes the uterus and fallopian tubes produce a fluid that kills sperm. A copper IUD may last up to 10 years.  Hormone IUD. This type of IUD is made of plastic and contains the hormone progestin (synthetic progesterone). The hormone thickens mucus in the cervix and prevents sperm from entering the uterus. It also thins the uterine lining to prevent implantation of a fertilized egg. The hormone can weaken or kill the sperm that get into the uterus. A hormone IUD may last 3-5 years. Tell a health care provider about:  Any allergies you have.  All medicines you are taking, including vitamins, herbs, eye drops, creams, and over-the-counter medicines.  Any problems you or family  members have had with anesthetic medicines.  Any blood disorders you have.  Any surgeries you have had.  Any medical conditions you have, including any STIs (sexually transmitted infections) you may have.  Whether you are pregnant or may be pregnant. What are the risks? Generally, this is a safe procedure. However, problems may occur, including:  Infection.  Bleeding.  Allergic reactions to medicines.  Accidental puncture (perforation) of the uterus, or damage to other structures or organs.  Accidental placement of the IUD either in the muscle layer of the uterus (myometrium) or outside the uterus.  The IUD falling out of the uterus (expulsion). This is more common among women who have recently had a child.  Pregnancy that happens in the fallopian tube (ectopic pregnancy).  Infection of the uterus and fallopian tubes (pelvic inflammatory disease). What happens before the procedure?  Schedule the IUD insertion for when you will have your menstrual period or right after, to make sure you are not pregnant. Placement of the IUD is better tolerated shortly after a menstrual cycle.  Follow instructions from your health care provider about eating or drinking restrictions.  Ask your health care provider about changing or stopping your regular medicines. This is especially important if you are taking diabetes medicines or blood thinners.  You may get a pain reliever to take before the procedure.  You may have tests for:  Pregnancy. A pregnancy test involves having a urine sample taken.  STIs. Placing an IUD in someone who has an STI can make the  infection worse.  Cervical cancer. You may have a Pap test to check for this type of cancer. This means collecting cells from your cervix to be examined under a microscope.  You may have a physical exam to determine the size and position of your uterus. The procedure may vary among health care providers and hospitals. What happens during  the procedure?  A tool (speculum) will be placed in your vagina and widened so that your health care provider can see your cervix.  Medicine may be applied to your cervix to help lower your risk of infection (antiseptic medicine).  You may be given an anesthetic medicine to numb each side of your cervix (intracervical block or paracervical block). This medicine is usually given by an injection into the cervix.  A tool (uterine sound) will be inserted into your uterus to determine the length of your uterus and the direction that your uterus may be tilted.  A slim instrument or tube (IUD inserter) that holds the IUD will be inserted into your vagina, through your cervical canal, and into your uterus.  The IUD will be placed in the uterus, and the IUD inserter will be removed.  The strings that are attached to the IUD will be trimmed so that they lie just below the cervix. The procedure may vary among health care providers and hospitals. What happens after the procedure?  You may have bleeding after the procedure. This is normal. It varies from light bleeding (spotting) for a few days to menstrual-like bleeding.  You may have cramping and pain.  You may feel dizzy or light-headed.  You may have lower back pain. Summary  An intrauterine device (IUD) is a small, T-shaped device that has one or two nylon strings hanging down from it.  Two types of IUDs are available. You may have a copper IUD or a hormone IUD.  Schedule the IUD insertion for when you will have your menstrual period or right after, to make sure you are not pregnant. Placement of the IUD is better tolerated shortly after a menstrual cycle.  You may have bleeding after the procedure. This is normal. It varies from light spotting for a few days to menstrual-like bleeding. This information is not intended to replace advice given to you by your health care provider. Make sure you discuss any questions you have with your health  care provider. Document Released: 04/05/2011 Document Revised: 06/28/2016 Document Reviewed: 06/28/2016 Elsevier Interactive Patient Education  2017 [employer].  Levonorgestrel intrauterine device (IUD) What is this medicine? LEVONORGESTREL IUD (LEE voe nor jes trel) is a contraceptive (birth control) device. The device is placed inside the uterus by a healthcare professional. It is used to prevent pregnancy. This device can also be used to treat heavy bleeding that occurs during your period. This medicine may be used for other purposes; ask your health care provider or pharmacist if you have questions. COMMON BRAND NAME(S): Minette Headland What should I tell my health care provider before I take this medicine? They need to know if you have any of these conditions: -abnormal Pap smear -cancer of the breast, uterus, or cervix -diabetes -endometritis -genital or pelvic infection now or in the past -have more than one sexual partner or your partner has more than one partner -heart disease -history of an ectopic or tubal pregnancy -immune system problems -IUD in place -liver disease or tumor -problems with blood clots or take blood-thinners -seizures -use intravenous drugs -uterus of unusual shape -vaginal  bleeding that has not been explained -an unusual or allergic reaction to levonorgestrel, other hormones, silicone, or polyethylene, medicines, foods, dyes, or preservatives -pregnant or trying to get pregnant -breast-feeding How should I use this medicine? This device is placed inside the uterus by a health care professional. Talk to your pediatrician regarding the use of this medicine in children. Special care may be needed. Overdosage: If you think you have taken too much of this medicine contact a poison control center or emergency room at once. NOTE: This medicine is only for you. Do not share this medicine with others. What if I miss a dose? This does not  apply. Depending on the brand of device you have inserted, the device will need to be replaced every 3 to 5 years if you wish to continue using this type of birth control. What may interact with this medicine? Do not take this medicine with any of the following medications: -amprenavir -bosentan -fosamprenavir This medicine may also interact with the following medications: -aprepitant -armodafinil -barbiturate medicines for inducing sleep or treating seizures -bexarotene -boceprevir -griseofulvin -medicines to treat seizures like carbamazepine, ethotoin, felbamate, oxcarbazepine, phenytoin, topiramate -modafinil -pioglitazone -rifabutin -rifampin -rifapentine -some medicines to treat HIV infection like atazanavir, efavirenz, indinavir, lopinavir, nelfinavir, tipranavir, ritonavir -St. John's wort -warfarin This list may not describe all possible interactions. Give your health care provider a list of all the medicines, herbs, non-prescription drugs, or dietary supplements you use. Also tell them if you smoke, drink alcohol, or use illegal drugs. Some items may interact with your medicine. What should I watch for while using this medicine? Visit your doctor or health care professional for regular check ups. See your doctor if you or your partner has sexual contact with others, becomes HIV positive, or gets a sexual transmitted disease. This product does not protect you against HIV infection (AIDS) or other sexually transmitted diseases. You can check the placement of the IUD yourself by reaching up to the top of your vagina with clean fingers to feel the threads. Do not pull on the threads. It is a good habit to check placement after each menstrual period. Call your doctor right away if you feel more of the IUD than just the threads or if you cannot feel the threads at all. The IUD may come out by itself. You may become pregnant if the device comes out. If you notice that the IUD has come  out use a backup birth control method like condoms and call your health care provider. Using tampons will not change the position of the IUD and are okay to use during your period. This IUD can be safely scanned with magnetic resonance imaging (MRI) only under specific conditions. Before you have an MRI, tell your healthcare provider that you have an IUD in place, and which type of IUD you have in place. What side effects may I notice from receiving this medicine? Side effects that you should report to your doctor or health care professional as soon as possible: -allergic reactions like skin rash, itching or hives, swelling of the face, lips, or tongue -fever, flu-like symptoms -genital sores -high blood pressure -no menstrual period for 6 weeks during use -pain, swelling, warmth in the leg -pelvic pain or tenderness -severe or sudden headache -signs of pregnancy -stomach cramping -sudden shortness of breath -trouble with balance, talking, or walking -unusual vaginal bleeding, discharge -yellowing of the eyes or skin Side effects that usually do not require medical attention (report to your doctor or  health care professional if they continue or are bothersome): -acne -breast pain -change in sex drive or performance -changes in weight -cramping, dizziness, or faintness while the device is being inserted -headache -irregular menstrual bleeding within first 3 to 6 months of use -nausea This list may not describe all possible side effects. Call your doctor for medical advice about side effects. You may report side effects to FDA at 1-800-FDA-1088. Where should I keep my medicine? This does not apply. NOTE: This sheet is a summary. It may not cover all possible information. If you have questions about this medicine, talk to your doctor, pharmacist, or health care provider.  2018 Elsevier/Gold Standard (2016-05-19 14:14:56)

## 2020-02-08 NOTE — Telephone Encounter (Signed)
I was under the impression that pulmonary was arranging an overnight oximetry study. If this is not the case, I can see if we can arrange through a home health provider. I don't understand if she is hypoxic on BIPAP at night, why would bleed oxygen into her BIPAP not be covered if her sats were sufficiently low enough?  Dr. Debara Pickett

## 2020-02-09 ENCOUNTER — Other Ambulatory Visit: Payer: Self-pay

## 2020-02-09 ENCOUNTER — Emergency Department (HOSPITAL_COMMUNITY)
Admission: EM | Admit: 2020-02-09 | Discharge: 2020-02-10 | Disposition: A | Discharge status: Home / Self Care | Payer: Medicare Other | Attending: Emergency Medicine | Admitting: Emergency Medicine

## 2020-02-09 ENCOUNTER — Ambulatory Visit (INDEPENDENT_AMBULATORY_CARE_PROVIDER_SITE_OTHER): Payer: Medicare Other | Admitting: Cardiovascular Disease

## 2020-02-09 ENCOUNTER — Other Ambulatory Visit: Payer: Self-pay | Admitting: Neurology

## 2020-02-09 ENCOUNTER — Encounter (HOSPITAL_COMMUNITY): Payer: Self-pay | Admitting: Gynecologic Oncology

## 2020-02-09 ENCOUNTER — Telehealth: Payer: Self-pay | Admitting: *Deleted

## 2020-02-09 DIAGNOSIS — Z87891 Personal history of nicotine dependence: Secondary | ICD-10-CM | POA: Diagnosis not present

## 2020-02-09 DIAGNOSIS — R310 Gross hematuria: Secondary | ICD-10-CM | POA: Diagnosis not present

## 2020-02-09 DIAGNOSIS — I110 Hypertensive heart disease with heart failure: Secondary | ICD-10-CM | POA: Diagnosis not present

## 2020-02-09 DIAGNOSIS — E039 Hypothyroidism, unspecified: Secondary | ICD-10-CM | POA: Insufficient documentation

## 2020-02-09 DIAGNOSIS — Z79899 Other long term (current) drug therapy: Secondary | ICD-10-CM | POA: Diagnosis not present

## 2020-02-09 DIAGNOSIS — Z7984 Long term (current) use of oral hypoglycemic drugs: Secondary | ICD-10-CM | POA: Diagnosis not present

## 2020-02-09 DIAGNOSIS — Z885 Allergy status to narcotic agent status: Secondary | ICD-10-CM | POA: Diagnosis not present

## 2020-02-09 DIAGNOSIS — T83098A Other mechanical complication of other indwelling urethral catheter, initial encounter: Secondary | ICD-10-CM | POA: Insufficient documentation

## 2020-02-09 DIAGNOSIS — I509 Heart failure, unspecified: Secondary | ICD-10-CM | POA: Insufficient documentation

## 2020-02-09 DIAGNOSIS — Z882 Allergy status to sulfonamides status: Secondary | ICD-10-CM | POA: Insufficient documentation

## 2020-02-09 DIAGNOSIS — E119 Type 2 diabetes mellitus without complications: Secondary | ICD-10-CM | POA: Diagnosis not present

## 2020-02-09 DIAGNOSIS — Z7901 Long term (current) use of anticoagulants: Secondary | ICD-10-CM | POA: Diagnosis not present

## 2020-02-09 DIAGNOSIS — R1030 Lower abdominal pain, unspecified: Secondary | ICD-10-CM | POA: Diagnosis present

## 2020-02-09 DIAGNOSIS — T839XXA Unspecified complication of genitourinary prosthetic device, implant and graft, initial encounter: Secondary | ICD-10-CM

## 2020-02-09 LAB — URINALYSIS, ROUTINE W REFLEX MICROSCOPIC
Bacteria, UA: NONE SEEN
RBC / HPF: >50 RBC/hpf — ABNORMAL HIGH (ref 0–5)

## 2020-02-09 LAB — CBC
HCT: 42.1 % (ref 36.0–46.0)
Hemoglobin: 13.8 g/dL (ref 12.0–15.0)
MCH: 31.7 pg (ref 26.0–34.0)
MCHC: 32.8 g/dL (ref 30.0–36.0)
MCV: 96.6 fL (ref 80.0–100.0)
Platelets: 161 10*3/uL (ref 150–400)
RBC: 4.36 MIL/uL (ref 3.87–5.11)
RDW: 12.9 % (ref 11.5–15.5)
WBC: 9 10*3/uL (ref 4.0–10.5)
nRBC: 0 % (ref 0.0–0.2)

## 2020-02-09 LAB — POCT INR: INR: 2.5 (ref 2.0–3.0)

## 2020-02-09 LAB — BASIC METABOLIC PANEL
Anion gap: 3 — ABNORMAL LOW (ref 5–15)
BUN: 21 mg/dL (ref 8–23)
CO2: 35 mmol/L — ABNORMAL HIGH (ref 22–32)
Calcium: 8.9 mg/dL (ref 8.9–10.3)
Chloride: 103 mmol/L (ref 98–111)
Creatinine, Ser: 0.47 mg/dL (ref 0.44–1.00)
GFR calc Af Amer: >60 mL/min (ref >60)
GFR calc non Af Amer: >60 mL/min (ref >60)
Glucose, Bld: 130 mg/dL — ABNORMAL HIGH (ref 70–99)
Potassium: 4.2 mmol/L (ref 3.5–5.1)
Sodium: 141 mmol/L (ref 135–145)

## 2020-02-09 LAB — PROTIME-INR
INR: 2.9 — ABNORMAL HIGH (ref 0.8–1.2)
Prothrombin Time: 29.4 seconds — ABNORMAL HIGH (ref 11.4–15.2)

## 2020-02-09 MED ORDER — FENTANYL CITRATE (PF) 100 MCG/2ML IJ SOLN
75.0000 ug | Freq: Once | INTRAMUSCULAR | Status: AC
  Administered 2020-02-09: 75 ug via INTRAVENOUS
  Filled 2020-02-09: qty 2

## 2020-02-09 MED ORDER — WARFARIN SODIUM 5 MG PO TABS: ORAL_TABLET | ORAL | 1 refills | Status: DC

## 2020-02-09 NOTE — Telephone Encounter (Signed)
I apologize for the back-and-forth regarding oxygen supplementation  We did try to get the overnight oximetry results from [employer] which the patient informed us she already had done.  Could not get a result faxed to [name] from [employer].  Unfortunately insurance companies do not pay for oxygen supplementation to be added to BiPAP or CPAP if the patient was not titrated to the same in the sleep lab.  Toni [name] was no longer using her BiPAP and is not interested in continuing to use it.  That leaves [name] with the option of overnight oximetry on room air to qualify her for oxygen supplementation, not the perfect treatment for sleep disordered breathing but this may help to keep the saturations where it needs to be.

## 2020-02-09 NOTE — Progress Notes (Signed)
Per Hassan Rowan, PST Scheduler patient states has transportation and unable to come in for preop labs and appt.  Shameeka called and spoke with Barbaraann Share in OB/GYN Oncology office and told of above and stated patient would be a Same Day.

## 2020-02-09 NOTE — Telephone Encounter (Signed)
Dr Ander Slade, Dr. Debara Pickett  I understand the dilemma only too well- we are here in the same boat, as without PAP use there is no titration to oxygen in the sleep lab, which then qualifies for supplemental oxygen.   If you can help with ONO , even if suboptimal, I would be most grateful.  [name], MD    FYI to Nurse Mardene Celeste.

## 2020-02-09 NOTE — Telephone Encounter (Signed)
Patient called the office and stated "I have started having a some white discharge after I saw Dr Berline Lopes Friday. Now I have a brownish discharge. I saw the urologist today and they thought it was possible bacterial vaginosis." Explained that the nurse would call her back.

## 2020-02-09 NOTE — Addendum Note (Signed)
Addended by: Fidel Levy on: 02/09/2020 08:18 AM   Modules accepted: Orders

## 2020-02-09 NOTE — ED Provider Notes (Signed)
[facility] DEPT Provider Note   CSN: [id number] Arrival date & time: 02/09/20  2140     History No chief complaint on file.   Toni [name] is a 74 y.o. female with a history of multiple sclerosis, neurogenic bladder, atrial fibrillation on Coumadin, hypothyroidism, and renal cell carcinoma who presents to the emergency department with a chief complaint of hematuria.  Patient was seen by nurse practitioner Daine Gravel at [facility] urology and had a Foley catheter placed in the office.  She was discharged home and later in the day family noticed hematuria in her leg bag.  The patient reports constant, worsening abdominal pain, distention, and urge.  States that she feels as if she needs to empty her bladder, but her Foley does not seem to be draining.  She is having associated nausea.  She denies fever, chills, flank pain, vomiting, diarrhea, constipation, chest pain, shortness of breath, dizziness, lightheadedness.  No treatment prior to arrival.  She is on Coumadin for atrial fibrillation.  The history is provided by the patient. No language interpreter was used.       Past Medical History:  Diagnosis Date  . Abnormality of gait 03/01/2015  . Anxiety   . Arthritis    "knees" (01/19/2016)  . Asthma    as a child   . Back pain   . Cancer ([place])    / RENAL CELL CARCINOMA - GETS CT SCAN EVERY 6 MONTHS TO WATCH   . Chronic pain    "nerve pain from the MS"  . Complex atypical endometrial hyperplasia   . Constipation   . Depression   . DVT (deep venous thrombosis) ([place]) 1983   "RLE; may have just been phlebitis; it was before the age of dopplers"  . Dysrhythmia    afib   . 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 GERD (gastroesophageal reflux disease)   . HLD (hyperlipidemia)   . Hypotension   . Hypothyroidism   . Joint pain   . Multiple sclerosis ([place])    has had this may 1989-[place]me[place]  reg doc  . OSA on CPAP    bipap machine - SLEEP APNEA WORSE SINCE COVID IN 1/21 PER PATIENT SETTING HAS INCREASED FROM 9 TO 30   . Osteoarthritis   . PAF (paroxysmal atrial fibrillation) ([place])    on coumadin; documented on monitor 06/2010  . Pneumonia 11/2011  . Sleep apnea     Patient Active Problem List   Diagnosis Date Noted  . COVID-19 virus infection 08/23/2019  . Bilateral leg weakness 08/04/2019  . Weakness 08/04/2019  . Unilateral primary osteoarthritis, left knee 01/09/2019  . Type 2 diabetes mellitus without complication, without long-term current use of insulin ([place]) 08/27/2018  . Obstructive sleep apnea treated with bilevel positive airway pressure (BiPAP) 08/15/2018  . 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 ([place]) 11/21/2017  . Permanent atrial fibrillation ([place]) 11/21/2017  . Tightness of leg fascia 11/21/2017  . Muscle spasm of both lower legs 11/21/2017  . Carpal tunnel syndrome, left upper limb 09/13/2017  . Carpal tunnel syndrome, right upper limb 09/13/2017  . Other insomnia 06/11/2017  . Depression 06/11/2017  . Vitamin D deficiency 03/22/2017  . Right hand weakness 08/06/2016  . Nonischemic cardiomyopathy ([place]) 05/09/2016  . Localized edema 05/09/2016  . Long term current use of anticoagulant therapy 02/03/2016  . Amnestic MCI (mild cognitive impairment with memory  loss) 03/30/2015  . Obesity hypoventilation syndrome ([place]) 03/30/2015  . Abnormality of gait 03/01/2015  . Chronic constipation with overflow incontinence 01/25/2015  . 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) ([place]) 01/25/2015  . 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 Varicose veins of both lower extremities with complications 25/85/2778  . Long term (current) use of anticoagulants 11/05/2012  . 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 Essential  hypertension   . Thyroid disease   . HLD (hyperlipidemia)     Past Surgical History:  Procedure Laterality Date  . ANTERIOR CERVICAL DECOMP/DISCECTOMY FUSION  01/2004  . CARDIAC CATHETERIZATION  06/22/2010   normal coronary arteries, PAF  . CATARACT EXTRACTION, BILATERAL Bilateral   . CHILD BIRTHS     X2  . DILATATION & CURETTAGE/HYSTEROSCOPY WITH MYOSURE N/A 01/15/2020   Procedure: DILATATION & CURETTAGE/HYSTEROSCOPY WITH MYOSURE;  Surgeon: Arvella Nigh, MD;  Location: [facility];  Service: Gynecology;  Laterality: N/A;  pt in wheelchair-has MS  . DILATION AND CURETTAGE OF UTERUS    . INFUSION PUMP IMPLANTATION  ~ 2009   baclofen infusion in lower abd  . PAIN PUMP REVISION N/A 07/29/2014   Procedure: Baclofen pump replacement;  Surgeon: Erline Levine, MD;  Location: [facility];  Service: Neurosurgery;  Laterality: N/A;  Baclofen pump replacement  . TONSILLECTOMY AND ADENOIDECTOMY    . VARICOSE VEIN SURGERY  ~ 1968  . VENOUS ABLATION  12/16/2010   radiofreq ablation -Dr Elisabeth Cara and [facility]  . WISDOM TOOTH EXTRACTION       OB History    Gravida  2   Para      Term      Preterm      AB      Living        SAB      TAB      Ectopic      Multiple      Live Births              Family History  Problem Relation Age of Onset  . Coronary artery disease Father        at age 25  . Hyperlipidemia Father   . Thyroid disease Father   . Coronary artery disease Maternal Grandmother   . Depression Maternal Grandmother   . Cancer Paternal Grandmother        breast  . Depression Mother   . Sudden death Mother   . Anxiety disorder Mother   . Alcoholism Son     Social History   Tobacco Use  . Smoking status: Former Smoker    Packs/day: 0.50    Years: 7.00    Pack years: 3.50    Types: Cigarettes    Quit date: 08/21/1976    Years since quitting: 43.5  . Smokeless tobacco: Never Used  Vaping Use  . Vaping Use: Never used  Substance Use Topics  . Alcohol use: Not  Currently  . Drug use: No    Home Medications Prior to Admission medications   Medication Sig Start Date End Date Taking? Authorizing Provider  acetaminophen (TYLENOL) 500 MG tablet Take 500-1,000 mg by mouth every 6 (six) hours as needed for fever or headache (or pain).    [provider]  Ascorbic Acid (VITAMIN C) 500 MG CAPS Take 500 mg by mouth daily.    [provider]  baclofen (LIORESAL) 20 MG tablet Once to  two tablets a day for breakthrough spasms Patient taking differently: Take 20 mg by mouth 2 (two) times daily as needed for muscle spasms.  08/27/18   Dohmeier, Asencion Partridge, MD  belladonna-opium (B&O SUPPRETTES) 16.2-30 MG suppository Place 1 suppository rectally daily as needed for up to 12 days for pain. 02/10/20 02/22/20  Jerriah Ines A, PA-C  buPROPion (WELLBUTRIN XL) 300 MG 24 hr tablet Take 1 tablet (300 mg total) by mouth daily. 08/23/17   Dennard Nip D, MD  Calcium Carbonate-Vit D-Min (CALTRATE 600+D PLUS MINERALS) 600-800 MG-UNIT TABS Take 2 tablets by mouth 2 (two) times daily.    [provider]  cetirizine (ZYRTEC) 10 MG tablet Take 10 mg by mouth daily as needed for allergies.     [provider]  Cholecalciferol (VITAMIN D) 2000 units tablet Take 1 tablet (2,000 Units total) by mouth 2 (two) times daily. 03/22/17   Dennard Nip D, MD  Coenzyme Q10 (CO Q-10 PO) Take 1 tablet by mouth daily.    [provider]  corticotropin (ACTHAR) 80 UNIT/ML injectable gel Inject 1 mL (80 Units total) into the muscle every other day. For 10 days each month. 12/01/19   Dohmeier, Asencion Partridge, MD  Cranberry 1000 MG CAPS Take 2 capsules by mouth 2 (two) times daily.     [provider]  diltiazem (CARDIZEM CD) 240 MG 24 hr capsule Take 1 capsule (240 mg total) by mouth daily. 01/28/20   Hilty, Nadean Corwin, MD  diltiazem (CARDIZEM) 60 MG tablet Take 1 tablet (60 mg total) by mouth every 6 (six) hours as needed for up to 7 days (HR sustained >110bpm).  10/17/19 01/28/20  Terrilee Croak, MD  docusate sodium (COLACE) 100 MG capsule Take 1 capsule (100 mg total) by mouth daily. 08/12/16   Rai, Vernelle Emerald, MD  famotidine (PEPCID) 20 MG tablet Take 20 mg by mouth at bedtime.  06/11/18   [provider]  fluconazole (DIFLUCAN) 150 MG tablet Take 150 mg by mouth every 3 (three) days. 01/01/20   [provider]  fluticasone (FLONASE) 50 MCG/ACT nasal spray Place 2 sprays into both nostrils daily as needed for allergies.  03/23/15   [provider]  furosemide (LASIX) 20 MG tablet Take 1 tablet by mouth daily, may take second dose if needed for excess edema 09/30/19   Hilty, Nadean Corwin, MD  gabapentin (NEURONTIN) 300 MG capsule TAKE 1 CAPSULE BY MOUTH UP  TO 4 TIMES DAILY Patient taking differently: Take 300 mg by mouth 4 (four) times daily.  12/31/18   Dohmeier, Asencion Partridge, MD  GENERLAC 10 GM/15ML SOLN TAKE 15 ML BY MOUTH TWICE DAILY AS NEEDED Patient taking differently: Take 10 g by mouth 2 (two) times daily as needed (constipation).  06/25/19   Dohmeier, Asencion Partridge, MD  levothyroxine (SYNTHROID, LEVOTHROID) 75 MCG tablet Take 75 mcg by mouth daily.    [provider]  melatonin 3 MG TABS tablet Take 3 mg by mouth at bedtime.    [provider]  midodrine (PROAMATINE) 5 MG tablet Take 1 tablet (5 mg total) by mouth 3 (three) times daily with meals. 09/30/19   Hilty, Nadean Corwin, MD  nitrofurantoin, macrocrystal-monohydrate, (MACROBID) 100 MG capsule Take 100 mg by mouth 2 (two) times daily.    [provider]  Omega-3 Fatty Acids (FISH OIL PO) Take 1 capsule by mouth daily.    [provider]  omeprazole (PRILOSEC) 20 MG capsule Take 20 mg by mouth daily.     [provider]  ondansetron (ZOFRAN) 4 MG tablet Take 4-8 mg by mouth every 8 (eight) hours as needed for nausea or vomiting.     [provider]  oxyCODONE-acetaminophen (PERCOCET/ROXICET) 5-325 MG tablet Take 1 tablet by mouth every 8  (eight) hours as needed for severe pain. 02/10/20   Asir Bingley A, PA-C  psyllium (METAMUCIL SMOOTH TEXTURE) 28 % packet Take 1 packet by mouth daily.     [provider]  simvastatin (ZOCOR) 10 MG tablet Take 10 mg by mouth every evening.  03/01/18   [provider]  SYRINGE/NEEDLE, DISP, 1 ML (BD LUER-LOK SYRINGE) 25G X 5/8" 1 ML MISC Use as directed to inject  Acthar 06/15/15   Dohmeier, Asencion Partridge, MD  Tamsulosin HCl (FLOMAX) 0.4 MG CAPS Take 0.4 mg by mouth daily as needed (bladder emptying).     [provider]  warfarin (COUMADIN) 5 MG tablet Take 1/2 to 1 tablet by mouth daily as directed by coumadin clinic. 02/09/20   Pixie Casino, MD    Allergies    Chlorhexidine, Hydrocodone, Oxycodone, Chlorhexidine gluconate, and Zoloft [sertraline]  Review of Systems   Review of Systems  Constitutional: Negative for activity change, chills, diaphoresis and fever.  HENT: Negative for congestion and sore throat.   Respiratory: Negative for shortness of breath.   Cardiovascular: Negative for chest pain and palpitations.  Gastrointestinal: Positive for abdominal pain and nausea. Negative for blood in stool, constipation, diarrhea and vomiting.  Genitourinary: Positive for difficulty urinating, dysuria, hematuria and urgency. Negative for enuresis, frequency, menstrual problem, vaginal bleeding, vaginal discharge and vaginal pain.  Musculoskeletal: Negative for back pain, myalgias, neck pain and neck stiffness.  Skin: Negative for rash.  Allergic/Immunologic: Negative for immunocompromised state.  Neurological: Negative for seizures, syncope, weakness, numbness and headaches.  Psychiatric/Behavioral: Negative for confusion.    Physical Exam Updated Vital Signs BP (!) 109/58 (BP Location: Right Arm)   Pulse 87   Temp 97.9 F (36.6 C) (Oral)   Resp 16   SpO2 100%   Physical Exam Vitals and nursing note reviewed.  Constitutional:      General: She is not in acute  distress.    Appearance: She is not ill-appearing, toxic-appearing or diaphoretic.  HENT:     Head: Normocephalic.  Eyes:     Conjunctiva/sclera: Conjunctivae normal.  Cardiovascular:     Rate and Rhythm: Normal rate and regular rhythm.     Heart sounds: No murmur heard.  No friction rub. No gallop.   Pulmonary:     Effort: Pulmonary effort is normal. No respiratory distress.     Breath sounds: No stridor. No wheezing, rhonchi or rales.  Chest:     Chest wall: No tenderness.  Abdominal:     General: There is distension.     Palpations: Abdomen is soft. There is no mass.     Tenderness: There is abdominal tenderness. There is no right CVA tenderness, left CVA tenderness, guarding or rebound.     Hernia: No hernia is present.     Comments: Abdomen is moderately distended, but soft.  She is tender to palpation in the bilateral lower abdomen.  No rebound or guarding.  No CVA tenderness.  Foley leg bag with gross hematuria.  Musculoskeletal:     Cervical back: Neck supple.     Right lower leg: No edema.     Left lower leg: No edema.  Skin:    General: Skin is warm.     Findings: No  rash.  Neurological:     Mental Status: She is alert.  Psychiatric:        Behavior: Behavior normal.     ED Results / Procedures / Treatments   Labs (all labs ordered are listed, but only abnormal results are displayed) Labs Reviewed  URINALYSIS, ROUTINE W REFLEX MICROSCOPIC - Abnormal; Notable for the following components:      Result Value   Color, Urine RED (*)    APPearance CLOUDY (*)    Glucose, UA   (*)    Value: TEST NOT REPORTED DUE TO COLOR INTERFERENCE OF URINE PIGMENT   Hgb urine dipstick   (*)    Value: TEST NOT REPORTED DUE TO COLOR INTERFERENCE OF URINE PIGMENT   Bilirubin Urine   (*)    Value: TEST NOT REPORTED DUE TO COLOR INTERFERENCE OF URINE PIGMENT   Ketones, ur   (*)    Value: TEST NOT REPORTED DUE TO COLOR INTERFERENCE OF URINE PIGMENT   Protein, ur   (*)    Value: TEST  NOT REPORTED DUE TO COLOR INTERFERENCE OF URINE PIGMENT   Nitrite   (*)    Value: TEST NOT REPORTED DUE TO COLOR INTERFERENCE OF URINE PIGMENT   Leukocytes,Ua   (*)    Value: TEST NOT REPORTED DUE TO COLOR INTERFERENCE OF URINE PIGMENT   RBC / HPF >50 (*)    All other components within normal limits  PROTIME-INR - Abnormal; Notable for the following components:   Prothrombin Time 29.4 (*)    INR 2.9 (*)    All other components within normal limits  BASIC METABOLIC PANEL - Abnormal; Notable for the following components:   CO2 35 (*)    Glucose, Bld 130 (*)    Anion gap 3 (*)    All other components within normal limits  CBC    EKG None  Radiology US Renal  Result Date: 02/10/2020 CLINICAL DATA:  Abdominal distension and suprapubic tenderness. History of renal cancer. EXAM: RENAL / URINARY TRACT ULTRASOUND COMPLETE COMPARISON:  Abdominal CT 08/04/2019 FINDINGS: Right Kidney: Renal measurements: 10.6 x 5.0 x 4.9 cm = volume: 136 mL. Echogenicity within normal limits. No mass or hydronephrosis visualized. Left Kidney: Renal measurements: 11.0 x 5.6 x 5.7 cm = volume: 185 mL. Echogenicity within normal limits. No hydronephrosis visualized. Simple cyst in the lower kidney measuring 1.6 cm. Patient's known upper pole lesion is not well demonstrated by ultrasound. Bladder: Decompressed by Foley catheter. Other: No abdominal ascites. IMPRESSION: 1. No hydronephrosis or obstructive uropathy. 2. Known left renal lesion is not well demonstrated by ultrasound. 3. Decompressed bladder by Foley catheter. Electronically Signed   By: Keith Rake M.D.   On: 02/10/2020 02:08    Procedures Procedures (including critical care time)  Medications Ordered in ED Medications  fentaNYL (SUBLIMAZE) injection 75 mcg (75 mcg Intravenous Given 02/09/20 2310)  belladonna-opium (B&O) suppository 16.2-30 mg (1 suppository Rectal Given 02/10/20 0036)  fentaNYL (SUBLIMAZE) injection 75 mcg (75 mcg Intravenous  Given 02/10/20 0108)  sodium chloride 0.9 % bolus 500 mL (0 mLs Intravenous Stopped 02/10/20 0237)  ondansetron (ZOFRAN) injection 4 mg (4 mg Intravenous Given 02/10/20 0358)  oxyCODONE-acetaminophen (PERCOCET/ROXICET) 5-325 MG per tablet 1 tablet (1 tablet Oral Given 02/10/20 0358)    ED Course  I have reviewed the triage vital signs and the nursing notes.  Pertinent labs & imaging results that were available during my care of the patient were reviewed by me and considered in my medical  decision making (see chart for details).  Clinical Course as of Feb 09 741  Tue Feb 10, 2020  0247 Reviewed findings of renal ultrasound with patient.  Bladder is decompressed.  No urinary retention.  She has received suppository and 2 doses of fentanyl with minimal improvement in her pain.  Leg bag has been changed and has been actively draining.  However, urinary drainage is gross hematuria.  Will attempt 1 more liter of bladder irrigation to see if hematuria will clear.  Patient is agreeable with this plan.   [MM]    Clinical Course User Index [MM] Hashir Deleeuw, Laymond Purser, PA-C   MDM Rules/Calculators/A&P                          74 year old female with a history of multiple sclerosis, neurogenic bladder, atrial fibrillation on Coumadin, hypothyroidism, and renal cell carcinoma who presents to the emergency department from home with gross hematuria after having a Foley catheter placed by urology earlier today.  She is also endorsing worsening abdominal pain and distention and is concerned that her Foley is not draining.  Patient was seen and independently evaluated by Dr. Roxanne Mins, attending physician.  Recommends belladonna opium suppository for symptoms.  Hemoglobin is stable at 13.8.  INR is subtherapeutic at 2.9.  Urinalysis with minimal pyuria.   On exam, she has some moderate distention and tenderness in the lower abdomen.  Catheter was irrigated with 2 L of saline by RN.  However, after irrigation, pain was not  improved.  No additional urine noted and leg bag.  Replaced leg bag and Foley catheter was repositioned and now appears to be draining.  However, she continues to have significant gross hematuria, which is concerning since patient is on Coumadin.  Foley was then irrigated with another liter of saline and urine is now starting to clear.  Patient does continue to have pain, but reports significant improvement from arrival.  Discussed with Dr. Roxanne Mins, attending physician, who feels that the patient is appropriate for discharge and can follow-up with urology clinic in the morning.  Will discharge home with pain medication.  At time of discharge, she is hemodynamically stable and in no acute distress.  Strict return precautions given.  Safe for discharge to home with outpatient follow-up to urology.  Final Clinical Impression(s) / ED Diagnoses Final diagnoses:  Gross hematuria  Problem with Foley catheter, initial encounter [facility])    Rx / DC Orders ED Discharge Orders         Ordered    belladonna-opium (B&O SUPPRETTES) 16.2-30 MG suppository  Daily PRN     Discontinue  Reprint     02/10/20 0347    oxyCODONE-acetaminophen (PERCOCET/ROXICET) 5-325 MG tablet  Every 8 hours PRN     Discontinue  Reprint     02/10/20 [place], Laymond Purser, PA-C 81/27/51 7001    Delora Fuel, MD 74/94/49 [phone]

## 2020-02-09 NOTE — Telephone Encounter (Signed)
Her Urologist gave her suppositories to treat the vaginosis.  Dr. Berline Lopes is in agreement to use the suppositories as they may help in preventing post op infection. Pt verbalized understanding.

## 2020-02-09 NOTE — ED Triage Notes (Signed)
PER EMS: Pt is coming home with c/o blood in catheter. Pt had a catheter placed this afternoon with urology. Pt was discharged home and moved out of her wheelchair into her bed when family noticed blood in urine. Pt states she feels like she needs to pee but can't. Pain at catheter site. Hx afib.   EMS VITALS: BP 112/66 HR 80 SPO2 96% RA TEMP 97.2

## 2020-02-09 NOTE — Telephone Encounter (Signed)
Overnight oximetry study ordered.  Patient's home health agency is [facility]   Message routed to sleep study pool to pre-cert test and arrange

## 2020-02-10 ENCOUNTER — Telehealth: Payer: Self-pay | Admitting: Pharmacist Clinician (PhC)/ Clinical Pharmacy Specialist

## 2020-02-10 ENCOUNTER — Emergency Department (HOSPITAL_COMMUNITY): Payer: Medicare Other

## 2020-02-10 DIAGNOSIS — I959 Hypotension, unspecified: Secondary | ICD-10-CM | POA: Diagnosis not present

## 2020-02-10 DIAGNOSIS — Z7401 Bed confinement status: Secondary | ICD-10-CM | POA: Diagnosis not present

## 2020-02-10 DIAGNOSIS — R5381 Other malaise: Secondary | ICD-10-CM | POA: Diagnosis not present

## 2020-02-10 DIAGNOSIS — M2550 Pain in unspecified joint: Secondary | ICD-10-CM | POA: Diagnosis not present

## 2020-02-10 DIAGNOSIS — T83098A Other mechanical complication of other indwelling urethral catheter, initial encounter: Secondary | ICD-10-CM | POA: Diagnosis not present

## 2020-02-10 DIAGNOSIS — F29 Unspecified psychosis not due to a substance or known physiological condition: Secondary | ICD-10-CM | POA: Diagnosis not present

## 2020-02-10 MED ORDER — BELLADONNA ALKALOIDS-OPIUM 16.2-30 MG RE SUPP
1.0000 | Freq: Once | RECTAL | Status: AC
  Administered 2020-02-10: 1 via RECTAL
  Filled 2020-02-10: qty 1

## 2020-02-10 MED ORDER — ONDANSETRON HCL 4 MG/2ML IJ SOLN
4.0000 mg | Freq: Once | INTRAMUSCULAR | Status: AC
  Administered 2020-02-10: 4 mg via INTRAVENOUS
  Filled 2020-02-10: qty 2

## 2020-02-10 MED ORDER — SODIUM CHLORIDE 0.9 % IV BOLUS
500.0000 mL | Freq: Once | INTRAVENOUS | Status: AC
  Administered 2020-02-10: 500 mL via INTRAVENOUS

## 2020-02-10 MED ORDER — BELLADONNA ALKALOIDS-OPIUM 16.2-30 MG RE SUPP: 1.0000 | Freq: Every day | RECTAL | 0 refills | Status: DC | PRN

## 2020-02-10 MED ORDER — FENTANYL CITRATE (PF) 100 MCG/2ML IJ SOLN
75.0000 ug | Freq: Once | INTRAMUSCULAR | Status: AC
  Administered 2020-02-10: 75 ug via INTRAVENOUS
  Filled 2020-02-10: qty 2

## 2020-02-10 MED ORDER — OXYCODONE-ACETAMINOPHEN 5-325 MG PO TABS
1.0000 | ORAL_TABLET | Freq: Once | ORAL | Status: AC
  Administered 2020-02-10: 1 via ORAL
  Filled 2020-02-10: qty 1

## 2020-02-10 MED ORDER — OXYCODONE-ACETAMINOPHEN 5-325 MG PO TABS: 1.0000 | ORAL_TABLET | Freq: Three times a day (TID) | ORAL | 0 refills | Status: DC | PRN

## 2020-02-10 NOTE — Discharge Instructions (Addendum)
Thank you for allowing me to care for you today in the Emergency Department.   Please call [facility] in the morning to follow up from your ER visit tonight.   You can take a belladonna-opium suppository daily for bladder spasms. Do not take this medication with other medications that may make you drowsy.  Take one tablet of Percocet every 8 hours for pain. I would take this medication with Zofran to prevent nausea.   Return to the emergency department if your Foley catheter stops draining, if you develop significantly worsening, uncontrollable pain, if you have significant blood loss, or other new, concerning symptoms.

## 2020-02-10 NOTE — Telephone Encounter (Signed)
Patient called to report issues with hematuria.  She had gone to urologist yesterday and had Foley placed.  By evening had gross hematuria and went to ED.  Ultrasound was negative and was assumed that Foley itself was cause of bleeding.  Pt went back to urologist today.  Foley was removed, however was still having bleeding.  It was decided to remove Foley and use I/O catheterization.  Urology recommended holding warfarin x 5 days to get bleeding under control  Agree with urology assessment.  Pt has CHADS2-VASc score of 5 (chf, htn, age, dm, female).  She will hold warfarin x 5 days and restart at normal dose.  HH scheduled to check INR again next week on Wednesday or Thursday.   Pt was also in contact with OB-GYN to determine if should cancel IUD procedure for Monday.

## 2020-02-10 NOTE — Telephone Encounter (Signed)
Can we make sure that Toni [name] gets an ONO done please  This is to qualify for oxygen use at night

## 2020-02-10 NOTE — Telephone Encounter (Signed)
Order for ONO was already placed by our office.

## 2020-02-11 ENCOUNTER — Other Ambulatory Visit: Payer: Self-pay | Admitting: Neurology

## 2020-02-11 NOTE — Progress Notes (Signed)
Barbaraann Share, RN at [facility] stated they were aware of patient's ED visit on 02/09/20 and events.  Barbaraann Share stated she was going to inform Dr Berline Lopes.  Barbaraann Share stated they would touch base with patient on 02/13/20 to check on status of patient before surgery on 02/16/20.

## 2020-02-13 ENCOUNTER — Telehealth: Payer: Self-pay

## 2020-02-13 NOTE — Telephone Encounter (Signed)
Toni [name] states that she is feeling better today from her Ed visit the other day. She is holding her coumadin as directed from Dr. Tresa Moore. The urinary bleeding has stopped.  She states that she wants to proceed with surgery on Monday 02-16-20 as planned.  She understands her pre-op instructions and has no questions or concerns at this time. Arrival is 0700 to admitting. Pt to get pre op labs 02-16-20 due to transportation.

## 2020-02-13 NOTE — Telephone Encounter (Signed)
Dr. Berline Lopes will have in discharge instructions from 02-16-20 procedure when to resume her coumadin.

## 2020-02-16 ENCOUNTER — Ambulatory Visit (HOSPITAL_COMMUNITY)
Admission: RE | Admit: 2020-02-16 | Discharge: 2020-02-16 | Disposition: A | Discharge status: Home / Self Care | Payer: Medicare Other | Attending: Gynecologic Oncology | Admitting: Gynecologic Oncology

## 2020-02-16 ENCOUNTER — Ambulatory Visit (HOSPITAL_COMMUNITY): Payer: Medicare Other | Admitting: Physician Assistant

## 2020-02-16 ENCOUNTER — Encounter (HOSPITAL_COMMUNITY): Payer: Self-pay | Admitting: Gynecologic Oncology

## 2020-02-16 ENCOUNTER — Encounter (HOSPITAL_COMMUNITY)
Admission: RE | Disposition: A | Discharge status: Home / Self Care | Payer: Self-pay | Source: Home / Self Care | Attending: Gynecologic Oncology

## 2020-02-16 DIAGNOSIS — Z885 Allergy status to narcotic agent status: Secondary | ICD-10-CM | POA: Diagnosis not present

## 2020-02-16 DIAGNOSIS — E785 Hyperlipidemia, unspecified: Secondary | ICD-10-CM | POA: Insufficient documentation

## 2020-02-16 DIAGNOSIS — Z79899 Other long term (current) drug therapy: Secondary | ICD-10-CM | POA: Diagnosis not present

## 2020-02-16 DIAGNOSIS — Z87891 Personal history of nicotine dependence: Secondary | ICD-10-CM | POA: Insufficient documentation

## 2020-02-16 DIAGNOSIS — Z993 Dependence on wheelchair: Secondary | ICD-10-CM | POA: Insufficient documentation

## 2020-02-16 DIAGNOSIS — F329 Major depressive disorder, single episode, unspecified: Secondary | ICD-10-CM | POA: Diagnosis not present

## 2020-02-16 DIAGNOSIS — Z86718 Personal history of other venous thrombosis and embolism: Secondary | ICD-10-CM | POA: Diagnosis not present

## 2020-02-16 DIAGNOSIS — Z8349 Family history of other endocrine, nutritional and metabolic diseases: Secondary | ICD-10-CM | POA: Diagnosis not present

## 2020-02-16 DIAGNOSIS — G8929 Other chronic pain: Secondary | ICD-10-CM | POA: Insufficient documentation

## 2020-02-16 DIAGNOSIS — I509 Heart failure, unspecified: Secondary | ICD-10-CM | POA: Insufficient documentation

## 2020-02-16 DIAGNOSIS — Z30432 Encounter for removal of intrauterine contraceptive device: Secondary | ICD-10-CM | POA: Insufficient documentation

## 2020-02-16 DIAGNOSIS — Z20822 Contact with and (suspected) exposure to covid-19: Secondary | ICD-10-CM | POA: Insufficient documentation

## 2020-02-16 DIAGNOSIS — N8502 Endometrial intraepithelial neoplasia [EIN]: Secondary | ICD-10-CM | POA: Insufficient documentation

## 2020-02-16 DIAGNOSIS — Z888 Allergy status to other drugs, medicaments and biological substances status: Secondary | ICD-10-CM | POA: Insufficient documentation

## 2020-02-16 DIAGNOSIS — G35 Multiple sclerosis: Secondary | ICD-10-CM | POA: Insufficient documentation

## 2020-02-16 DIAGNOSIS — G4733 Obstructive sleep apnea (adult) (pediatric): Secondary | ICD-10-CM | POA: Insufficient documentation

## 2020-02-16 DIAGNOSIS — Z85528 Personal history of other malignant neoplasm of kidney: Secondary | ICD-10-CM | POA: Insufficient documentation

## 2020-02-16 DIAGNOSIS — E039 Hypothyroidism, unspecified: Secondary | ICD-10-CM | POA: Insufficient documentation

## 2020-02-16 DIAGNOSIS — M1990 Unspecified osteoarthritis, unspecified site: Secondary | ICD-10-CM | POA: Insufficient documentation

## 2020-02-16 DIAGNOSIS — I110 Hypertensive heart disease with heart failure: Secondary | ICD-10-CM | POA: Diagnosis not present

## 2020-02-16 DIAGNOSIS — Z7901 Long term (current) use of anticoagulants: Secondary | ICD-10-CM | POA: Diagnosis not present

## 2020-02-16 DIAGNOSIS — K219 Gastro-esophageal reflux disease without esophagitis: Secondary | ICD-10-CM | POA: Insufficient documentation

## 2020-02-16 DIAGNOSIS — I480 Paroxysmal atrial fibrillation: Secondary | ICD-10-CM | POA: Diagnosis not present

## 2020-02-16 DIAGNOSIS — Z8249 Family history of ischemic heart disease and other diseases of the circulatory system: Secondary | ICD-10-CM | POA: Insufficient documentation

## 2020-02-16 DIAGNOSIS — N8501 Benign endometrial hyperplasia: Secondary | ICD-10-CM | POA: Diagnosis not present

## 2020-02-16 DIAGNOSIS — Z79890 Hormone replacement therapy (postmenopausal): Secondary | ICD-10-CM | POA: Insufficient documentation

## 2020-02-16 HISTORY — PX: INTRAUTERINE DEVICE (IUD) INSERTION: SHX5877

## 2020-02-16 HISTORY — PX: DILATION AND CURETTAGE OF UTERUS: SHX78

## 2020-02-16 HISTORY — DX: Unspecified asthma, uncomplicated: J45.909

## 2020-02-16 LAB — BASIC METABOLIC PANEL
Anion gap: 8 (ref 5–15)
BUN: 26 mg/dL — ABNORMAL HIGH (ref 8–23)
CO2: 33 mmol/L — ABNORMAL HIGH (ref 22–32)
Calcium: 8.9 mg/dL (ref 8.9–10.3)
Chloride: 98 mmol/L (ref 98–111)
Creatinine, Ser: 0.52 mg/dL (ref 0.44–1.00)
GFR calc Af Amer: >60 mL/min (ref >60)
GFR calc non Af Amer: >60 mL/min (ref >60)
Glucose, Bld: 103 mg/dL — ABNORMAL HIGH (ref 70–99)
Potassium: 4.4 mmol/L (ref 3.5–5.1)
Sodium: 139 mmol/L (ref 135–145)

## 2020-02-16 LAB — CBC
HCT: 43.4 % (ref 36.0–46.0)
Hemoglobin: 14.3 g/dL (ref 12.0–15.0)
MCH: 31.4 pg (ref 26.0–34.0)
MCHC: 32.9 g/dL (ref 30.0–36.0)
MCV: 95.4 fL (ref 80.0–100.0)
Platelets: 209 10*3/uL (ref 150–400)
RBC: 4.55 MIL/uL (ref 3.87–5.11)
RDW: 12.7 % (ref 11.5–15.5)
WBC: 7.4 10*3/uL (ref 4.0–10.5)
nRBC: 0 % (ref 0.0–0.2)

## 2020-02-16 LAB — SARS CORONAVIRUS 2 BY RT PCR (HOSPITAL ORDER, PERFORMED IN ~~LOC~~ HOSPITAL LAB): SARS Coronavirus 2: NEGATIVE

## 2020-02-16 LAB — TYPE AND SCREEN
ABO/RH(D): A POS
Antibody Screen: NEGATIVE

## 2020-02-16 SURGERY — DILATION AND CURETTAGE
Anesthesia: Monitor Anesthesia Care

## 2020-02-16 MED ORDER — PROPOFOL 500 MG/50ML IV EMUL
INTRAVENOUS | Status: DC | PRN
  Administered 2020-02-16: 100 mg via INTRAVENOUS

## 2020-02-16 MED ORDER — LACTATED RINGERS IV SOLN: INTRAVENOUS | Status: DC

## 2020-02-16 MED ORDER — PROPOFOL 500 MG/50ML IV EMUL
INTRAVENOUS | Status: DC | PRN
  Administered 2020-02-16: 125 ug/kg/min via INTRAVENOUS

## 2020-02-16 MED ORDER — FENTANYL CITRATE (PF) 100 MCG/2ML IJ SOLN
INTRAMUSCULAR | Status: DC | PRN
  Administered 2020-02-16: 50 ug via INTRAVENOUS

## 2020-02-16 MED ORDER — FENTANYL CITRATE (PF) 100 MCG/2ML IJ SOLN
INTRAMUSCULAR | Status: AC
  Filled 2020-02-16: qty 2

## 2020-02-16 MED ORDER — DEXAMETHASONE SODIUM PHOSPHATE 10 MG/ML IJ SOLN
INTRAMUSCULAR | Status: DC | PRN
  Administered 2020-02-16: 5 mg via INTRAVENOUS

## 2020-02-16 MED ORDER — ONDANSETRON HCL 4 MG/2ML IJ SOLN
INTRAMUSCULAR | Status: AC
  Filled 2020-02-16: qty 2

## 2020-02-16 MED ORDER — LIDOCAINE-EPINEPHRINE 1 %-1:100000 IJ SOLN
INTRAMUSCULAR | Status: AC
  Filled 2020-02-16: qty 1

## 2020-02-16 MED ORDER — SILVER NITRATE-POT NITRATE 75-25 % EX MISC
CUTANEOUS | Status: AC
  Filled 2020-02-16: qty 10

## 2020-02-16 MED ORDER — LIDOCAINE HCL 1 % IJ SOLN
INTRAMUSCULAR | Status: DC | PRN
  Administered 2020-02-16: 10 mL

## 2020-02-16 MED ORDER — PROPOFOL 10 MG/ML IV BOLUS
INTRAVENOUS | Status: AC
  Filled 2020-02-16: qty 20

## 2020-02-16 MED ORDER — ONDANSETRON HCL 4 MG/2ML IJ SOLN
INTRAMUSCULAR | Status: DC | PRN
  Administered 2020-02-16: 4 mg via INTRAVENOUS

## 2020-02-16 MED ORDER — ONDANSETRON HCL 4 MG/2ML IJ SOLN: 4.0000 mg | Freq: Once | INTRAMUSCULAR | Status: DC | PRN

## 2020-02-16 MED ORDER — LIDOCAINE 2% (20 MG/ML) 5 ML SYRINGE
INTRAMUSCULAR | Status: DC | PRN
Start: 2020-02-16 — End: 2020-02-16
  Administered 2020-02-16: 60 mg via INTRAVENOUS

## 2020-02-16 MED ORDER — DEXAMETHASONE SODIUM PHOSPHATE 10 MG/ML IJ SOLN
INTRAMUSCULAR | Status: AC
  Filled 2020-02-16: qty 1

## 2020-02-16 MED ORDER — FENTANYL CITRATE (PF) 100 MCG/2ML IJ SOLN: 25.0000 ug | INTRAMUSCULAR | Status: DC | PRN

## 2020-02-16 MED ORDER — PHENYLEPHRINE 40 MCG/ML (10ML) SYRINGE FOR IV PUSH (FOR BLOOD PRESSURE SUPPORT)
PREFILLED_SYRINGE | INTRAVENOUS | Status: DC | PRN
  Administered 2020-02-16: 120 ug via INTRAVENOUS
  Administered 2020-02-16: 80 ug via INTRAVENOUS
  Administered 2020-02-16: 120 ug via INTRAVENOUS
  Administered 2020-02-16: 80 ug via INTRAVENOUS

## 2020-02-16 MED ORDER — ACETAMINOPHEN 500 MG PO TABS
1000.0000 mg | ORAL_TABLET | Freq: Once | ORAL | Status: DC
  Filled 2020-02-16: qty 2

## 2020-02-16 MED ORDER — GABAPENTIN 100 MG PO CAPS: 100.0000 mg | ORAL_CAPSULE | ORAL | Status: DC

## 2020-02-16 MED ORDER — LEVONORGESTREL 20 MCG/24HR IU IUD
INTRAUTERINE_SYSTEM | INTRAUTERINE | Status: AC
  Filled 2020-02-16 (×2): qty 1

## 2020-02-16 MED ORDER — ACETAMINOPHEN 500 MG PO TABS
1000.0000 mg | ORAL_TABLET | ORAL | Status: AC
  Administered 2020-02-16: 1000 mg via ORAL

## 2020-02-16 SURGICAL SUPPLY — 25 items
BACTOSHIELD CHG 4% 4OZ (MISCELLANEOUS) ×2
CATH ROBINSON RED A/P 16FR (CATHETERS) ×3 IMPLANT
COVER WAND RF STERILE (DRAPES) IMPLANT
DRAPE SHEET LG 3/4 BI-LAMINATE (DRAPES) ×3 IMPLANT
DRAPE UNDERBUTTOCKS STRL (DISPOSABLE) ×3 IMPLANT
DRSG TELFA 3X8 NADH (GAUZE/BANDAGES/DRESSINGS) ×3 IMPLANT
GAUZE 4X4 16PLY RFD (DISPOSABLE) ×3 IMPLANT
GLOVE BIO SURGEON STRL SZ 6 (GLOVE) ×6 IMPLANT
GOWN STRL REUS W/ TWL LRG LVL3 (GOWN DISPOSABLE) ×2 IMPLANT
GOWN STRL REUS W/TWL LRG LVL3 (GOWN DISPOSABLE) ×6
KIT BASIN (CUSTOM PROCEDURE TRAY) ×3 IMPLANT
KIT TURNOVER KIT A (KITS) IMPLANT
NEEDLE SPNL 22GX3.5 QUINCKE BK (NEEDLE) ×3 IMPLANT
NS IRRIG 1000ML POUR BTL (IV SOLUTION) ×3 IMPLANT
PACK LITHOTOMY IV (CUSTOM PROCEDURE TRAY) ×3 IMPLANT
PAD OB MATERNITY 4.3X12.25 (PERSONAL CARE ITEMS) ×3 IMPLANT
PENCIL SMOKE EVACUATOR (MISCELLANEOUS) IMPLANT
SCRUB CHG 4% DYNA-HEX 4OZ (MISCELLANEOUS) ×1 IMPLANT
SURGILUBE 2OZ TUBE FLIPTOP (MISCELLANEOUS) ×3 IMPLANT
SYR BULB IRRIG 60ML STRL (SYRINGE) ×3 IMPLANT
TOWEL OR 17X26 10 PK STRL BLUE (TOWEL DISPOSABLE) ×3 IMPLANT
TOWEL OR NON WOVEN STRL DISP B (DISPOSABLE) ×3 IMPLANT
UNDERPAD 30X36 HEAVY ABSORB (UNDERPADS AND DIAPERS) ×6 IMPLANT
YANKAUER SUCT BULB TIP 10FT TU (MISCELLANEOUS) ×3 IMPLANT
mirena  52 mg ×3 IMPLANT

## 2020-02-16 NOTE — Discharge Instructions (Signed)
02/16/2020  Activity: 1. Be up and out of the bed (or as mobile as you are normally) during the day.  Take a nap if needed.  You may walk up steps but be careful and use the hand rail.  Stair climbing will tire you more than you think, you may need to stop part way and rest.   2. No lifting or straining for 2 weeks.  3. Shower daily.  Use soap and water on your incision and pat dry; don't rub.   4. No sexual activity and nothing in the vagina for 4 weeks.  Medications:  - Take ibuprofen and tylenol first line for pain control. Take these regularly (every 6 hours) to decrease the build up of pain.  Diet: 1. Low sodium Heart Healthy Diet is recommended.  2. It is safe to use a laxative if you have difficulty moving your bowels.   Wound Care: 1. Keep clean and dry.  Shower daily.  Reasons to call the Doctor:   Fever - Oral temperature greater than 100.4 degrees Fahrenheit  Foul-smelling vaginal discharge  Difficulty urinating  Nausea and vomiting  Difficulty breathing with or without chest pain  New calf pain especially if only on one side  Sudden, continuing increased vaginal bleeding with or without clots.   Follow-up: 1. See Jeral Pinch in September, phone visit once pathology back.  Contacts: For questions or concerns you should contact:  Dr. Jeral Pinch at [phone] After hours and on week-ends call [phone] and ask to speak to the physician on call for Gynecologic Oncology

## 2020-02-16 NOTE — Transfer of Care (Signed)
Immediate Anesthesia Transfer of Care Note  Patient: Toni [name]  Procedure(s) Performed: DILATATION AND CURETTAGE (N/A ) INTRAUTERINE DEVICE (IUD) INSERTION - MIRENA (N/A )  Patient Location: PACU  Anesthesia Type:General  Level of Consciousness: awake, alert  and patient cooperative  Airway & Oxygen Therapy: Patient Spontanous Breathing and Patient connected to face mask oxygen  Post-op Assessment: Report given to RN and Post -op Vital signs reviewed and stable  Post vital signs: Reviewed and stable  Last Vitals:  Vitals Value Taken Time  BP 121/64 02/16/20 1055  Temp    Pulse 73 02/16/20 1058  Resp 12 02/16/20 1058  SpO2 100 % 02/16/20 1058  Vitals shown include unvalidated device data.  Last Pain:  Vitals:   02/16/20 0829  TempSrc:   PainSc: 3          Complications: No complications documented.

## 2020-02-16 NOTE — Anesthesia Procedure Notes (Signed)
Procedure Name: LMA Insertion Date/Time: 02/16/2020 10:16 AM Performed by: West Pugh, CRNA Pre-anesthesia Checklist: Patient identified, Emergency Drugs available, Suction available, Patient being monitored and Timeout performed Patient Re-evaluated:Patient Re-evaluated prior to induction Oxygen Delivery Method: Circle system utilized Preoxygenation: Pre-oxygenation with 100% oxygen Induction Type: IV induction LMA: LMA with gastric port inserted LMA Size: 4.0 Number of attempts: 1 Placement Confirmation: positive ETCO2 and breath sounds checked- equal and bilateral Tube secured with: Tape Dental Injury: Teeth and Oropharynx as per pre-operative assessment  Comments: Decision made to convert to LMA with TIVA. Uneventful procedure.

## 2020-02-16 NOTE — Op Note (Signed)
PATIENT: Toni [name] DATE: 05/17/1946   Preop Diagnosis: CAH  Postoperative Diagnosis: same  Surgery: D&C (dilation and curettage), Mirena IUD insertion (Lot [id number], exp 04/2022)  Surgeons:  Jeral Pinch, MD Assistant: none  Anesthesia: LMA  Estimated blood loss: 15 ml  IVF:  See I&O flowsheet   Urine output: unmeasured, patient voided after induction   Complications: None apparent  Pathology: endometrial curetteings  Operative findings: On EUA, small mobile uterus. Cervix 1cm dilated. Good uterine cri noted upon curettage, minimal tissue obtained. Uterus sounded to 7.5cm.  Procedure: The patient was identified in the preoperative holding area. Informed consent was signed on the chart. Patient was seen history was reviewed and exam was performed.   The patient was then taken to the operating room and placed in the supine position with SCD hose on. General anesthesia was then induced without difficulty. She was then placed in the dorsolithotomy position. The perineum was prepped with Betadine. The vagina was prepped with Betadine. The patient was then draped after the prep was dried.   Timeout was performed the patient, procedure, antibiotic, allergy, and length of procedure.   The speculum was placed in the vagina. The single tooth tenaculum was placed on the anterior lip of the cervix. The uterine sound was placed in the cervix and advanced to the fundus. The cervix was successively dilated using pratts dilators to 12F. A sharp curette was advanced to the fundus and a comprehensive curette of the endometrial cavity took place until a gritty feel was appreciated. The specimen was collected on a telfa and sent for permanent pathology.  Mirena IUD was inserted to the fundus, deployed, and inserter removed. Strings were trimmed at 3cm.  The tenaculum was removed and hemostasis was observed.   The vagina was irrigated.  All instrument, suture, laparotomy, Ray-Tec,  and needle counts were correct x2.  The patient tolerated the procedure well and was taken recovery room in stable condition.   Jeral Pinch MD Gynecologic Oncology

## 2020-02-16 NOTE — Anesthesia Postprocedure Evaluation (Signed)
Anesthesia Post Note  Patient: Toni [name]  Procedure(s) Performed: DILATATION AND CURETTAGE (N/A ) INTRAUTERINE DEVICE (IUD) INSERTION - MIRENA (N/A )     Patient location during evaluation: PACU Anesthesia Type: General Level of consciousness: awake and alert and oriented Pain management: pain level controlled Vital Signs Assessment: post-procedure vital signs reviewed and stable Respiratory status: spontaneous breathing, nonlabored ventilation and respiratory function stable Cardiovascular status: blood pressure returned to baseline and stable Postop Assessment: no apparent nausea or vomiting Anesthetic complications: no   No complications documented.  Last Vitals:  Vitals:   02/16/20 1115 02/16/20 1130  BP: (!) 113/93 (!) 119/55  Pulse: (!) 113 99  Resp: 12 18  Temp:  36.4 C  SpO2: 100% 98%    Last Pain:  Vitals:   02/16/20 1130  TempSrc:   PainSc: 0-No pain                 Nabiha Planck A.

## 2020-02-16 NOTE — Interval H&P Note (Signed)
History and Physical Interval Note:  02/16/2020 8:56 AM  Toni [name]  has presented today for surgery, with the diagnosis of COMPLEX ATYPICAL ENDOMETRIAL HYPERPLASIA.  The various methods of treatment have been discussed with the patient and family. After consideration of risks, benefits and other options for treatment, the patient has consented to  Procedure(s) with comments: DILATATION AND CURETTAGE AND POSSIBLE HYSTEROSCOPY (N/A) - NOT GENERAL -NO INTUBATION INTRAUTERINE DEVICE (IUD) INSERTION - MIRENA (N/A) as a surgical intervention.  The patient's history has been reviewed, patient examined, no change in status, stable for surgery.  I have reviewed the patient's chart and labs.  Questions were answered to the patient's satisfaction.     Lafonda Mosses

## 2020-02-16 NOTE — Anesthesia Preprocedure Evaluation (Addendum)
Anesthesia Evaluation  Patient identified by MRN, date of birth, ID band Patient awake    Reviewed: Allergy & Precautions, NPO status , Patient's Chart, lab work & pertinent test results  Airway Mallampati: II  TM Distance: >3 FB Neck ROM: Full    Dental no notable dental hx.    Pulmonary asthma , sleep apnea and Continuous Positive Airway Pressure Ventilation , former smoker,    Pulmonary exam normal breath sounds clear to auscultation       Cardiovascular hypertension, +CHF and + DVT  Normal cardiovascular exam+ dysrhythmias Atrial Fibrillation  Rhythm:Regular Rate:Normal  ECG: a-fib with RVR, rate 132  ECHO: 1. Left ventricular ejection fraction, by estimation, is 55 to 60%. The left ventricle has normal function. The left ventricle has no regional wall motion abnormalities. Left ventricular diastolic function could not be evaluated. 2. Right ventricular systolic function is normal. The right ventricular size is normal. There is normal pulmonary artery systolic pressure. The estimated right ventricular systolic pressure is 48.8 mmHg. 3. Left atrial size was severely dilated. 4. Right atrial size was mild to moderately dilated. 5. The mitral valve is grossly normal. Mild to moderate mitral valve regurgitation. 6. The aortic valve is tricuspid. Aortic valve regurgitation is not visualized. No aortic stenosis is present. 7. The inferior vena cava is dilated in size with <50% respiratory variability, suggesting right atrial pressure of 15 mmHg.   Neuro/Psych PSYCHIATRIC DISORDERS Anxiety Depression Multiple sclerosis  negative neurological ROS     GI/Hepatic Neg liver ROS, GERD  Medicated and Controlled,  Endo/Other  Hypothyroidism   Renal/GU negative Renal ROS     Musculoskeletal  (+) Arthritis , Chronic pain Wheelchair bound   Abdominal (+) + obese,   Peds  Hematology HLD   Anesthesia Other Findings COMPLEX  ATYPICAL ENDOMETRIAL HYPERPLASIA  Reproductive/Obstetrics                            Anesthesia Physical Anesthesia Plan  ASA: III  Anesthesia Plan: MAC   Post-op Pain Management:    Induction: Intravenous  PONV Risk Score and Plan: 2 and Ondansetron, Dexamethasone, Treatment may vary due to age or medical condition and Propofol infusion  Airway Management Planned: Simple Face Mask  Additional Equipment:   Intra-op Plan:   Post-operative Plan:   Informed Consent: I have reviewed the patients History and Physical, chart, labs and discussed the procedure including the risks, benefits and alternatives for the proposed anesthesia with the patient or authorized representative who has indicated his/her understanding and acceptance.     Dental advisory given  Plan Discussed with: CRNA  Anesthesia Plan Comments:         Anesthesia Quick Evaluation

## 2020-02-17 ENCOUNTER — Telehealth: Payer: Self-pay | Admitting: Neurology

## 2020-02-17 ENCOUNTER — Telehealth: Payer: Self-pay | Admitting: *Deleted

## 2020-02-17 ENCOUNTER — Encounter (HOSPITAL_COMMUNITY): Payer: Self-pay | Admitting: Gynecologic Oncology

## 2020-02-17 LAB — SURGICAL PATHOLOGY

## 2020-02-17 NOTE — Telephone Encounter (Signed)
Toni [name] states that she is feeling pretty god after her procedure yesterday. She is having some slight cramping. She denies vaginal bleeding. She is passing gas, urinating and had a BM.She is eating and drinking without difficulty. She denies fever or chills. Follow up appt. Confirmed w/ patient and she was encouraged to call the clinic with any questions or concerns in the meantime.

## 2020-02-17 NOTE — Telephone Encounter (Signed)
[facility] is sending records to fax [phone]  For her apt next week with Dr. Brett Fairy.

## 2020-02-18 ENCOUNTER — Telehealth: Payer: Self-pay | Admitting: Gynecologic Oncology

## 2020-02-18 ENCOUNTER — Telehealth: Payer: Self-pay | Admitting: *Deleted

## 2020-02-18 ENCOUNTER — Ambulatory Visit (INDEPENDENT_AMBULATORY_CARE_PROVIDER_SITE_OTHER): Payer: Medicare Other | Admitting: Pharmacist Clinician (PhC)/ Clinical Pharmacy Specialist

## 2020-02-18 ENCOUNTER — Telehealth: Payer: Self-pay | Admitting: Neurology

## 2020-02-18 DIAGNOSIS — Z7901 Long term (current) use of anticoagulants: Secondary | ICD-10-CM

## 2020-02-18 DIAGNOSIS — I4821 Permanent atrial fibrillation: Secondary | ICD-10-CM

## 2020-02-18 DIAGNOSIS — G4733 Obstructive sleep apnea (adult) (pediatric): Secondary | ICD-10-CM

## 2020-02-18 LAB — POCT INR: INR: 1 — AB (ref 2.0–3.0)

## 2020-02-18 NOTE — Telephone Encounter (Signed)
ONO results have come in for the patient while wearing her BiPAP. Will have Dr Dohmeier review the ONO report and contact the patient once result is read.

## 2020-02-18 NOTE — Telephone Encounter (Signed)
Called and left a message for the patient on her home number, callback number left. Also sent mychart message to her with pathology results from [facility] surgery.  Jeral Pinch MD Gynecologic Oncology

## 2020-02-18 NOTE — Telephone Encounter (Signed)
Ms. Wolven called regarding a message left this morning from Dr. Berline Lopes. I read her the My Chart message written by Dr. Berline Lopes this morning. She will follow up as scheduled and call back with any further questions or concerns.

## 2020-02-19 NOTE — Telephone Encounter (Signed)
Spoke with Dr Brett Fairy and she was able to review the ONO on BiPAP results. The pt's oxygen level would drop below 89% for a total of 54 min and 32 sec. The lowest the oxygen level dropped down was 50%. We are unable to provide oxygen for the patient based off this information without bringing her into the sleep lab and the patient medically can not do this. I will upload this information into the media tab so that her cardiologist and pulmonologist can hopefully assist her in treating her low oxygen.

## 2020-02-20 NOTE — Telephone Encounter (Signed)
Kindly message me when you upload it to media tab  She obviously does need oxygen supplementation with the information already available, the issue is how to get it covered DME company may be able to provide information on a workaround-the rules are quite stringent as far as I know

## 2020-02-24 ENCOUNTER — Telehealth: Payer: Self-pay | Admitting: *Deleted

## 2020-02-24 ENCOUNTER — Telehealth: Payer: Self-pay | Admitting: Internal Medicine

## 2020-02-24 NOTE — Telephone Encounter (Signed)
Called [name] regarding her phone call.  Advised her that we will send in a prescription for doxycyline to treat the uterus if it is the source.  She is concerned that the doxycyline may interfere with her warfarin and said she usually has cephalexin prescribed. Advised her that [name], NP will be notified and we will call her back.  Also discussed having home health to get a urine sample.  She said [facility] is sending a nurse this afternoon and will have them get a urine sample to send to [facility] and will send the results to [name] and [facility].

## 2020-02-24 NOTE — Telephone Encounter (Signed)
Pt waiting for urinalysis from [facility].  Has been running fever, some bleeding after D&C as well as post procedure uterine scraping.    Advised patient to take whichever antibiotic recommended by urology and we can dose warfarin around that.   Patient due for INR with [facility] tomorrow.  Will review with her at that time.

## 2020-02-24 NOTE — Telephone Encounter (Signed)
    Pt c/o medication issue:  1. Name of Medication: Antibiotics  2. How are you currently taking this medication (dosage and times per day)?   3. Are you having a reaction (difficulty breathing--STAT)?   4. What is your medication issue? [facility] calling she said they need to prescribe antibiotic to pt that will not interfere with pt's coumadin

## 2020-02-24 NOTE — Telephone Encounter (Signed)
Called Toni [name] back and advised her that Dr. Berline Lopes would like to see her urine test results before prescribing an antibiotic and that we also have a message in to the coumadin clinic.  She verbalized agreement and said her husband took a urine sample to [facility] today.

## 2020-02-24 NOTE — Telephone Encounter (Signed)
Patient called and stated "I had surgery last Tuesday with Dr Berline Lopes; [facility] with IUD placed). I had a foley placed Saturday by [facility] under the direction of Dr Tresa Moore. I have had a fever since Sunday of 100.4. I have been using tylenol; it brings it down, but then it goes back up. I am spotting blood with discharge and it has an odor. I am having pain now and its at a level of 4. The pain is at the open and into the vagina." Explained that Dr Berline Lopes is in the OR today and the message would be given to [facility] APP

## 2020-02-25 ENCOUNTER — Ambulatory Visit (INDEPENDENT_AMBULATORY_CARE_PROVIDER_SITE_OTHER): Payer: Medicare Other | Admitting: Cardiology

## 2020-02-25 ENCOUNTER — Telehealth: Payer: Self-pay | Admitting: Pulmonary Disease

## 2020-02-25 ENCOUNTER — Telehealth: Payer: Self-pay | Admitting: Oncology

## 2020-02-25 DIAGNOSIS — Z7901 Long term (current) use of anticoagulants: Secondary | ICD-10-CM | POA: Diagnosis not present

## 2020-02-25 LAB — POCT INR: INR: 2.5 (ref 2.0–3.0)

## 2020-02-25 NOTE — Telephone Encounter (Signed)
[facility] regarding urinalysis results.  Spoke to [place], RN who will contact Dr. Rogue Bussing about treatment (antibiotics) due to presence of nitrites. She will contact patient to update on treatment plan.

## 2020-02-25 NOTE — Telephone Encounter (Signed)
Overnight oximetry was reviewed, this was performed on room air Study date was 01/22/2020 Study was not stated to have been done on BiPAP  Shows SPO2 less than 88% for 2 hours 34 minutes  Needs oxygen supplementation  The need for oxygen supplementation is likely unrelated to sleep disordered breathing based off of the pattern of desaturations

## 2020-02-25 NOTE — Telephone Encounter (Signed)
Can we send a prescription for 3 L of oxygen  piped into BiPAP system to [facility].  Patient unable to go to the sleep lab for BiPAP titration with oxygen supplementation

## 2020-02-25 NOTE — Telephone Encounter (Signed)
Order to DME sent for 3 liters oxygen into Bipap as requested.

## 2020-02-25 NOTE — Addendum Note (Signed)
Addended by: Tery Sanfilippo R on: 02/25/2020 01:26 PM   Modules accepted: Orders

## 2020-02-25 NOTE — Telephone Encounter (Signed)
[facility] and requested urinalysis/culture results with Mickel Baas, RN who will fax results.

## 2020-02-26 ENCOUNTER — Telehealth (INDEPENDENT_AMBULATORY_CARE_PROVIDER_SITE_OTHER): Payer: Medicare Other | Admitting: Neurology

## 2020-02-26 ENCOUNTER — Encounter: Payer: Self-pay | Admitting: Neurology

## 2020-02-26 DIAGNOSIS — R0689 Other abnormalities of breathing: Secondary | ICD-10-CM

## 2020-02-26 DIAGNOSIS — Z9981 Dependence on supplemental oxygen: Secondary | ICD-10-CM

## 2020-02-26 DIAGNOSIS — G933 Postviral fatigue syndrome: Secondary | ICD-10-CM

## 2020-02-26 DIAGNOSIS — I4821 Permanent atrial fibrillation: Secondary | ICD-10-CM | POA: Diagnosis not present

## 2020-02-26 DIAGNOSIS — G35 Multiple sclerosis: Secondary | ICD-10-CM

## 2020-02-26 DIAGNOSIS — I428 Other cardiomyopathies: Secondary | ICD-10-CM | POA: Diagnosis not present

## 2020-02-26 DIAGNOSIS — G9331 Postviral fatigue syndrome: Secondary | ICD-10-CM | POA: Insufficient documentation

## 2020-02-26 MED ORDER — ALPRAZOLAM 0.5 MG PO TABS: 0.5000 mg | ORAL_TABLET | Freq: Three times a day (TID) | ORAL | 0 refills | Status: DC | PRN

## 2020-02-26 NOTE — Progress Notes (Signed)
Virtual Visit via Video Note  I connected with Toni [name] on 02/26/20 at  3:30 PM EDT by a video enabled telemedicine application and verified that I am speaking with the correct person using two identifiers.  Location: Patient: at home  Provider: at [facility] office.    I discussed the limitations of evaluation and management by telemedicine and the availability of in person appointments. The patient expressed understanding and agreed to proceed.  History of Present Illness:  [name] who prefers to be called Toni [name], is a 74 year old Caucasian female with a longstanding history of multiple sclerosis.  Her primary care physician is Dr. Prince Solian, MD PhD, her cardiologist is Dr. Debara Pickett and her new pulmonologist is Dr. Ander Slade.  [name], she has a baclofen pump to deal with the spasticity remaining she had further progression after she contracted Covid, she still feels that she has not recovered her baseline which is very likely.  The patient has atrial fibrillation for which she is followed by Dr. Debara Pickett, steroid infusions that be used in the past to limit an MS exacerbation had also contrast contributed to atrophic in onset.  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  BiPAP was no longer working and especially did not prevent hypoxemia at night.  She is using BiPAP present but it has produced an enormous oral airleak and she cannot tolerate a full facemask, he finally were able to convince her pulmonologist that she needs oxygen at 3 L either bled into this BiPAP device or to be used by nasal cannula .  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  Oncology gynecology did not find other malignant cells outside of the affected polyp.  She is now wearing an  IUD that has progesterone as a Depo.  Her baclofen pump will be refilled later this month by Dr. [name].  She has been taking ACTH to prevent relapses which has been kinder to her heart rate.  The baclofen pump seems to be malfunctioning and she is addressing this with Dr. [name].  She also developed constant bladder leaking and incontinence issues she has had a Foley catheter placed unfortunately there were 250 cc of residual urine that she could not spontaneously void.  With her Foley catheter however she contracted a UTI and she is already on the third round of Macrobid.  She was diagnosed with renal cell carcinoma 5 years ago which has been status quo and is followed yearly by urology.  Vital signs today were given by her caregiver and daughter Toni [name].  Blood pressure was 112/66 mmHg, heart rate was 74 and regular.  Her body temperature was 98.6 F, oxygen saturation was 97%.   PLAN :  1) She is taking Xanax as needed 90 tablets no refills she can take a pill when she cannot go to sleep or when she has an anxiety attack.  She has been asked by cardiology to take a half of a Xanax tablet to lower her heart rate when she has palpitations apparently this has worked for her.  She had a previous prescription for 30 tablets that has lasted 6 months.    2)Plan #2 MS attacks she has definitely had physical progressive decline ever since she contracted COVID-19.  I will not  change her overall treatment with ACTH and the baclofen pump  3).  Revisit with me in October 2021 MD visit only.  [name], MD    I discussed the assessment and treatment plan with the patient. The patient was provided an opportunity to ask questions and all were answered. The patient agreed with the plan and demonstrated an understanding of the instructions.   The patient was advised to call back or seek an in-person evaluation if the symptoms worsen or if the condition fails to improve as anticipated.  I provided 28  minutes of video -face-time during this encounter. I was able to discuss the need for oxygen and the application through BiPAP or tho rough nasal canula with her- BiPAP has been of limited benefit.  All PAP therapy data , ONO and visit notes with either me,  Dr Brett Fairy, MD, or Dr. [name], MD are in [place] and ONO under the media tap.    [name], MD

## 2020-02-26 NOTE — Patient Instructions (Signed)
Hypoventilation Syndrome  Hypoventilation syndrome (OHS) means that you are not breathing well enough to get air in and out of your lungs efficiently (ventilation). This causes a low oxygen level and a high carbon dioxide level in your blood (hypoventilation).  Having too much total body fat (obesity) is a significant risk factor for developing OHS. OHS makes it harder for your heart to pump oxygen-rich blood to your body. It can cause sleep disturbances and make you feel sleepy during the day. Over time, OHS can increase your risk for:  Heart disease.  High blood pressure (hypertension).  Reduced ability to absorb sugar from the bloodstream (insulin resistance).  Heart failure. Over time, OHS weakens your heart and can lead to heart failure. What are the causes? The exact cause of OHS is not known. Possible causes include:  Pressure on the lungs from excess body weight.  Obesity-related changes in how much air the lungs can hold (lung capacity) and how much they can expand (lung compliance).  Failure of the brain to regulate oxygen and carbon dioxide levels properly.  Chemicals (hormones) produced by excess fat cells interfering with breathing regulation.  A breathing condition in which breathing pauses or becomes shallow during sleep (sleep apnea). This condition can eventually cause the body to ventilate poorly and to hold onto carbon dioxide during the day. What increases the risk? You may have a greater risk for OHS if you:  Have a BMI of 30 or higher. BMI is an estimate of body fat that is calculated from height and weight. For adults, a BMI of 30 or higher is considered obese.  Are 110?73 years old.  Carry most of your excess weight around your waist.  Experience moderate symptoms of sleep apnea. What are the signs or symptoms? The most common symptoms of OHS are:  Daytime sleepiness.  Lack of energy.  Shortness of breath.  Morning headaches.  Sleep  apnea.  Trouble concentrating.  Irritability, mood swings, or depression.  Swollen veins in the neck.  Swelling of the legs. How is this diagnosed? Your health care provider may suspect OHS if you are obese and have poor breathing during the day and at night. Your health care provider will also do a physical exam. You may have tests to:  Measure your BMI.  Measure your blood oxygen level with a sensor placed on your finger (pulse oximetry).  Measure blood oxygen and carbon dioxide in a blood sample.  Measure the amount of red blood cells in a blood sample. OHS causes the number of red blood cells you have to increase (polycythemia).  Check your breathing ability (pulmonary function testing).  Check your breathing ability, breathing patterns, and oxygen level while you sleep (sleep study). You may also have a chest X-ray to rule out other breathing problems. You may have an electrocardiogram (ECG) and or echocardiogram to check for signs of heart failure. How is this treated? Weight loss is the most important part of treatment for OHS, and it may be the only treatment that you need. Other treatments may include:  Using a device to open your airway while you sleep, such as a continuous positive airway pressure (CPAP) machine that delivers oxygen to your airway through a mask.  Surgery (gastric bypass surgery) to lower your BMI. This may be needed if: ? You are very obese. ? Other treatments have not worked for you. ? Your OHS is very severe and is causing organ damage, such as heart failure. Follow these instructions  at home:  Medicines  Take over-the-counter and prescription medicines only as told by your health care provider.  Ask your health care provider what medicines are safe for you. You may be told to avoid medicines that can impair breathing and make OHS worse, such as sedatives and narcotics. Sleeping habits  If you are prescribed a CPAP machine, make sure you  understand and use the machine as directed.  Try to get 8 hours of sleep every night.  Go to bed at the same time every night, and get up at the same time every day. General instructions  Work with your health care provider to make a diet and exercise plan that helps you reach and maintain a healthy weight.  Eat a healthy diet.  Avoid smoking.  Exercise regularly as told by your health care provider.  During the evening, do not drink caffeine and do not eat heavy meals.  Keep all follow-up visits as told by your health care provider. This is important. Contact a health care provider if:  You experience new or worsening shortness of breath.  You have chest pain.  You have an irregular heartbeat (palpitations).  You have dizziness.  You faint.  You develop a cough.  You have a fever.  You have chest pain when you breathe (pleurisy). This information is not intended to replace advice given to you by your health care provider. Make sure you discuss any questions you have with your health care provider. Document Revised: 11/29/2018 Document Reviewed: 01/17/2016 Elsevier Patient Education  [place].

## 2020-02-26 NOTE — Telephone Encounter (Signed)
Toni [name] left a message saying that [facility] sent her in a prescription for Macrobid.

## 2020-02-27 ENCOUNTER — Telehealth: Payer: Self-pay | Admitting: Pulmonary Disease

## 2020-02-27 NOTE — Telephone Encounter (Signed)
Confirmed with patient that oxygen was ordered on 02/25/2020 and is still being processed. Patient verbalized understanding.

## 2020-03-01 ENCOUNTER — Ambulatory Visit: Payer: Medicare Other | Admitting: Neurology

## 2020-03-01 ENCOUNTER — Telehealth: Payer: Self-pay

## 2020-03-01 NOTE — Telephone Encounter (Signed)
Told Ms Toni [name] that the symptoms that she has noted are common with the initial insertion of the IUD.  It may take a few more weeks for her body to adjust to the IUD per [name], NP.   She is to call the office if the night sweats continue along with the hot flashes through the first week of August.  Pt verbalized understanding.

## 2020-03-01 NOTE — Telephone Encounter (Signed)
Toni [name] states that she has had some light intermittent  spotting  When she wipes with tissue after voiding. She also has some white to clear vaginal drainage that has an odor. She is experiencing night sweats and hot flashes and waking up drenched.  She wants to know if this is to be expected with the IUD.

## 2020-03-01 NOTE — Telephone Encounter (Signed)
Pt left a VM asking for a call to discuss her appt last week. There were some prescriptions that were supposed to be called in but were not. She is also asking about an oxygen order.

## 2020-03-02 ENCOUNTER — Other Ambulatory Visit: Payer: Self-pay | Admitting: Neurology

## 2020-03-02 DIAGNOSIS — G609 Hereditary and idiopathic neuropathy, unspecified: Secondary | ICD-10-CM

## 2020-03-02 NOTE — Telephone Encounter (Signed)
I called pt. She was wondering where the xanax RX went. I advised her that it went to [facility]. She is asking about her O2 order but it looks like Dr. Judson Roch office is working on that for her. She wanted to know if there are interactions between acthar and cephalexin or macrobid. I used UpToDate to analyze potential drug interactions and none were identified. I offered to speak with a WID on her behalf regarding this but she declined. Pt had no questions at this time but was encouraged to call back if questions arise.

## 2020-03-03 ENCOUNTER — Ambulatory Visit: Payer: Self-pay | Admitting: Internal Medicine

## 2020-03-03 DIAGNOSIS — Z7901 Long term (current) use of anticoagulants: Secondary | ICD-10-CM

## 2020-03-03 LAB — POCT INR: INR: 2.8 (ref 2.0–3.0)

## 2020-03-04 DIAGNOSIS — N3000 Acute cystitis without hematuria: Secondary | ICD-10-CM | POA: Diagnosis not present

## 2020-03-04 DIAGNOSIS — R338 Other retention of urine: Secondary | ICD-10-CM | POA: Diagnosis not present

## 2020-03-08 DIAGNOSIS — C642 Malignant neoplasm of left kidney, except renal pelvis: Secondary | ICD-10-CM | POA: Diagnosis not present

## 2020-03-08 DIAGNOSIS — R338 Other retention of urine: Secondary | ICD-10-CM | POA: Diagnosis not present

## 2020-03-10 ENCOUNTER — Telehealth: Payer: Self-pay | Admitting: Pulmonary Disease

## 2020-03-10 NOTE — Telephone Encounter (Signed)
I have called the patient and made her aware that this has been sent to [facility] looks like some kind of confusion with the message saying [facility] patient uses [facility]

## 2020-03-11 ENCOUNTER — Other Ambulatory Visit: Payer: Self-pay

## 2020-03-11 ENCOUNTER — Ambulatory Visit (INDEPENDENT_AMBULATORY_CARE_PROVIDER_SITE_OTHER): Payer: Medicare Other | Admitting: Neurology

## 2020-03-11 ENCOUNTER — Encounter: Payer: Self-pay | Admitting: Neurology

## 2020-03-11 VITALS — BP 112/60 | HR 70

## 2020-03-11 DIAGNOSIS — G35 Multiple sclerosis: Secondary | ICD-10-CM | POA: Diagnosis not present

## 2020-03-11 MED ORDER — BACLOFEN 40 MG/20ML IT SOLN
80.0000 mg | Freq: Once | INTRATHECAL | Status: AC
  Administered 2020-03-11: 80 mg via INTRATHECAL

## 2020-03-11 NOTE — Progress Notes (Signed)
Please refer to Botox procedure note. 

## 2020-03-11 NOTE — Procedures (Signed)
      History:  Toni [name] is a 74 year old patient with a history of multiple sclerosis with a paraparesis, she is coming in for a baclofen pump refill today, she has had significant issues following a Covid virus infection, she is still trying to recover from this.  Baclofen pump refill note  The baclofen pump site was cleaned with Betadine solution. A 21-gauge needle was inserted into the pump port site. Approximately 7 cc of residual baclofen was removed, the pump indicates a 4.4 cc residual. 40 cc of replacement baclofen was placed into the pump at 2000 mcg/cc concentration.  The pump was reprogrammed for the following settings: The pump rate was kept at simple continuous rate of 849.4 micrograms per day  The alarm volume is set at 1.5 cc. The next alarm date is June 09, 2020.  The patient tolerated the procedure well. There were no complications of the above procedure.  The [place] number is [phone] The baclofen expiration date is December 2022. The baclofen lot number is [id number].

## 2020-03-12 ENCOUNTER — Telehealth: Payer: Self-pay | Admitting: Pulmonary Disease

## 2020-03-12 NOTE — Telephone Encounter (Signed)
Spoke with patient. She stated that she was contacted by [facility] and was told that they received an order for a bipap machine instead of oxygen. She wanted to be sure the order was sent for O2. I advised her that I would check with [facility]. She verbalized understanding.   Will send a community message to [facility] and ask her to send the response back to triage as I am PRN.

## 2020-03-16 NOTE — Telephone Encounter (Signed)
Called and spoke with pt letting her know the info stated by [facility]. Pt stated that she received a call from [facility] yesterday 7/26 stating that her O2 should be delivered today. Nothing further needed.

## 2020-03-16 NOTE — Telephone Encounter (Signed)
Received the following community messages from [facility]:    New, Toni [name], Toni [name]; Toni [name], [place]; Toni [name] Order was received. However the o2 part of this order is in review with the qualification team currently. Once they let us know we have what is needed it will process with the o2. Thanks!       Previous Messages   ----- Message -----  From: Toni [name]  Sent: 03/15/2020 11:15 AM EDT  To: Toni [name], Toni [name], *  Subject: patient question                 Toni Baker!  I'm including the rest of my team to help with this question.    Thank you!  Toni [name]   ----- Message -----  From: Toni [name], [place]  Sent: 03/12/2020  4:58 PM EDT  To: Ria Comment!   Hope you are doing well!   I received a call from Toni [name]. She stated that she was contacted by [facility] today to start the process of getting a new bipap machine. She was under the impression that the order was for O2. I checked the order from 02/25/20 and it was for O2 to be blended into her bipap.   Can you check into this for me?   If possible, can you also route the message to triage since I am at pulmonary PRN on Fridays only.   Thank you as always!

## 2020-03-17 ENCOUNTER — Ambulatory Visit (INDEPENDENT_AMBULATORY_CARE_PROVIDER_SITE_OTHER): Payer: Medicare Other | Admitting: Cardiology

## 2020-03-17 DIAGNOSIS — N390 Urinary tract infection, site not specified: Secondary | ICD-10-CM | POA: Diagnosis not present

## 2020-03-17 DIAGNOSIS — Z7901 Long term (current) use of anticoagulants: Secondary | ICD-10-CM

## 2020-03-17 DIAGNOSIS — R300 Dysuria: Secondary | ICD-10-CM | POA: Diagnosis not present

## 2020-03-17 LAB — POCT INR: INR: 2.4 (ref 2.0–3.0)

## 2020-03-18 ENCOUNTER — Telehealth: Payer: Self-pay

## 2020-03-18 NOTE — Telephone Encounter (Signed)
Thanks for the update. Will provide appt for next f/u

## 2020-03-18 NOTE — Telephone Encounter (Signed)
rn called stated that they will be discharging them from home services the week of 8/11 but they will draw the inr that week. After that we will have to figure out a plan for after that. Will route to the pharmd pool for further review

## 2020-03-19 DIAGNOSIS — Z9889 Other specified postprocedural states: Secondary | ICD-10-CM | POA: Diagnosis not present

## 2020-03-19 DIAGNOSIS — H4423 Degenerative myopia, bilateral: Secondary | ICD-10-CM | POA: Insufficient documentation

## 2020-03-19 DIAGNOSIS — H3581 Retinal edema: Secondary | ICD-10-CM | POA: Diagnosis not present

## 2020-03-19 DIAGNOSIS — Z961 Presence of intraocular lens: Secondary | ICD-10-CM | POA: Diagnosis not present

## 2020-03-19 DIAGNOSIS — G35 Multiple sclerosis: Secondary | ICD-10-CM | POA: Diagnosis not present

## 2020-03-25 ENCOUNTER — Other Ambulatory Visit: Payer: Self-pay | Admitting: Neurology

## 2020-03-25 MED ORDER — LACTULOSE ENCEPHALOPATHY 10 GM/15ML PO SOLN: ORAL | 1 refills | Status: DC

## 2020-03-29 ENCOUNTER — Ambulatory Visit (INDEPENDENT_AMBULATORY_CARE_PROVIDER_SITE_OTHER): Payer: Medicare Other | Admitting: Orthopaedic Surgery

## 2020-03-29 ENCOUNTER — Ambulatory Visit (INDEPENDENT_AMBULATORY_CARE_PROVIDER_SITE_OTHER): Payer: Medicare Other

## 2020-03-29 ENCOUNTER — Encounter: Payer: Self-pay | Admitting: Orthopaedic Surgery

## 2020-03-29 DIAGNOSIS — M25511 Pain in right shoulder: Secondary | ICD-10-CM | POA: Diagnosis not present

## 2020-03-29 DIAGNOSIS — R200 Anesthesia of skin: Secondary | ICD-10-CM | POA: Diagnosis not present

## 2020-03-29 DIAGNOSIS — M25532 Pain in left wrist: Secondary | ICD-10-CM

## 2020-03-29 DIAGNOSIS — M25531 Pain in right wrist: Secondary | ICD-10-CM

## 2020-03-29 MED ORDER — LIDOCAINE HCL 1 % IJ SOLN
3.0000 mL | INTRAMUSCULAR | Status: AC | PRN
  Administered 2020-03-29: 3 mL

## 2020-03-29 MED ORDER — LIDOCAINE HCL 1 % IJ SOLN
1.0000 mL | INTRAMUSCULAR | Status: AC | PRN
  Administered 2020-03-29: 1 mL

## 2020-03-29 MED ORDER — METHYLPREDNISOLONE ACETATE 40 MG/ML IJ SUSP
40.0000 mg | INTRAMUSCULAR | Status: AC | PRN
  Administered 2020-03-29: 40 mg

## 2020-03-29 MED ORDER — METHYLPREDNISOLONE ACETATE 40 MG/ML IJ SUSP
40.0000 mg | INTRAMUSCULAR | Status: AC | PRN
  Administered 2020-03-29: 40 mg via INTRA_ARTICULAR

## 2020-03-29 NOTE — Progress Notes (Signed)
Office Visit Note   Patient: Toni [name]           Date of Birth: June 21, 1946           MRN: [id number] Visit Date: 03/29/2020              Requested by: Prince Solian, MD [address] [place],  [place] [address] PCP: Prince Solian, MD   Assessment & Plan: Visit Diagnoses:  1. Right shoulder pain, unspecified chronicity   2. Pain in left wrist   3. Pain in right wrist   4. Bilateral hand numbness     Plan: Since she is not a diabetic and is having such debilitating issues in her shoulder and hands, I do not mind trying steroid injections in both her right shoulder and both wrists today.  She understands the risks of this injection well.  She did tolerate it well.  Follow-up can be as needed.  She does waitlist 3 to 4 months between injections.  Follow-Up Instructions: Return if symptoms worsen or fail to improve.   Orders:  Orders Placed This Encounter  Procedures  . Large Joint Inj  . Hand/UE Inj  . XR Shoulder Right   No orders of the defined types were placed in this encounter.     Procedures: Large Joint Inj: R subacromial bursa on 03/29/2020 2:02 PM Indications: pain and diagnostic evaluation Details: 22 G 1.5 in needle  Arthrogram: No  Medications: 3 mL lidocaine 1 %; 40 mg methylPREDNISolone acetate 40 MG/ML Outcome: tolerated well, no immediate complications Procedure, treatment alternatives, risks and benefits explained, specific risks discussed. Consent was given by the patient. Immediately prior to procedure a time out was called to verify the correct patient, procedure, equipment, support staff and site/side marked as required. Patient was prepped and draped in the usual sterile fashion.   Hand/UE Inj: bilateral carpal tunnel for carpal tunnel syndrome on 03/29/2020 2:02 PM Medications (Right): 1 mL lidocaine 1 %; 40 mg methylPREDNISolone acetate 40 MG/ML Medications (Left): 1 mL lidocaine 1 %; 40 mg methylPREDNISolone acetate 40  MG/ML      Clinical Data: No additional findings.   Subjective: Chief Complaint  Patient presents with  . Left Hand - Pain  . Right Hand - Pain  The patient is well-known to me.  She comes in with continued bilateral hand pain, weakness and muscle atrophy with numbness and tingling.  She has known carpal tunnel syndrome as well as multiple sclerosis.  She is also been dealing with right shoulder pain that is quite significant.  She mainly gets around in a wheelchair.  She also has known significant Madelung's deformity's of both wrists.  She cannot fully supinate either wrist.  She is requesting steroid injections in at least her shoulder and both wrist at the transverse carpal ligament today.  She is not a diabetic.  She said no other acute change in her medical status other than slowly worsening MS.  She has lost weight as well.  HPI  Review of Systems There is currently no headache, chest pain, shortness of breath, fever, chills, nausea, vomiting  Objective: Vital Signs: There were no vitals taken for this visit.  Physical Exam She is alert and oriented x3 and in no acute distress Ortho Exam Examination right shoulder shows significant weakness as well as pain of the subacromial outlet with signs of impingement.  The shoulder is clinically well located.  Examination of both wrists show that she lacks full supination.  She  has prominent ulna is at both DRUJ's due to her chronic Madelung's deformity.  She has weak grip and pinch strength bilaterally with atrophy of the muscles in her hands. Specialty Comments:  No specialty comments available.  Imaging: XR Shoulder Right  Result Date: 03/29/2020 3 views of the right shoulder show well located shoulder with no acute findings.  There is significant arthritic changes at the [facility] joint and the humeral head is higher within the glenoid fossa suggesting rotator cuff disease that is chronic.    PMFS History: Patient Active Problem List    Diagnosis Date Noted  . 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 Oxygen dependent 02/26/2020  . Neurogenic hypoventilation 02/26/2020  . Complex atypical endometrial hyperplasia   . COVID-19 virus infection 08/23/2019  . Bilateral leg weakness 08/04/2019  . Weakness 08/04/2019  . Unilateral primary osteoarthritis, left knee 01/09/2019  . Type 2 diabetes mellitus without complication, without long-term current use of insulin ([place]) 08/27/2018  . Obstructive sleep apnea treated with bilevel positive airway pressure (BiPAP) 08/15/2018  . 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 ([place]) 11/21/2017  . Permanent atrial fibrillation ([place]) 11/21/2017  . Tightness of leg fascia 11/21/2017  . Muscle spasm of both lower legs 11/21/2017  . Carpal tunnel syndrome, left upper limb 09/13/2017  . Carpal tunnel syndrome, right upper limb 09/13/2017  . Other insomnia 06/11/2017  . Depression 06/11/2017  . Vitamin D deficiency 03/22/2017  . Right hand weakness 08/06/2016  . Nonischemic cardiomyopathy ([place]) 05/09/2016  . Localized edema 05/09/2016  . Long term current use of anticoagulant therapy 02/03/2016  . 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 Obesity hypoventilation syndrome ([place]) 03/30/2015  . Abnormality of gait 03/01/2015  . Chronic constipation with overflow incontinence 01/25/2015  . 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) ([place]) 01/25/2015  . 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 Varicose veins of both lower extremities with complications 26/71/2458  . Long term (current) use of anticoagulants 11/05/2012  . 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 Essential hypertension   . Thyroid disease   . HLD (hyperlipidemia)    Past Medical History:  Diagnosis Date  . Abnormality of gait 03/01/2015  . Anxiety   .  Arthritis    "knees" (01/19/2016)  . Asthma    as a child   . Back pain   . Cancer ([place])    / RENAL CELL CARCINOMA - GETS CT SCAN EVERY 6 MONTHS TO WATCH   . Chronic pain    "nerve pain from the MS"  . Complex atypical endometrial hyperplasia   . Constipation   . Depression   . DVT (deep venous thrombosis) ([place]) 1983   "RLE; may have just been phlebitis; it was before the age of dopplers"  . Dysrhythmia    afib   . 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 GERD (gastroesophageal reflux disease)   . HLD (hyperlipidemia)   . Hypotension   . Hypothyroidism   . Joint pain   . Multiple sclerosis ([place])    has had this may 1989-[place] reg doc  . OSA on CPAP    bipap machine - SLEEP APNEA WORSE SINCE COVID IN 1/21 PER PATIENT SETTING HAS INCREASED FROM 9 TO 30   . Osteoarthritis   . PAF (paroxysmal atrial fibrillation) ([place])  on coumadin; documented on monitor 06/2010  . Pneumonia 11/2011  . Sleep apnea     Family History  Problem Relation Age of Onset  . Coronary artery disease Father        at age 25  . Hyperlipidemia Father   . Thyroid disease Father   . Coronary artery disease Maternal Grandmother   . Depression Maternal Grandmother   . Cancer Paternal Grandmother        breast  . Depression Mother   . Sudden death Mother   . Anxiety disorder Mother   . Alcoholism Son     Past Surgical History:  Procedure Laterality Date  . ANTERIOR CERVICAL DECOMP/DISCECTOMY FUSION  01/2004  . CARDIAC CATHETERIZATION  06/22/2010   normal coronary arteries, PAF  . CATARACT EXTRACTION, BILATERAL Bilateral   . CHILD BIRTHS     X2  . DILATATION & CURETTAGE/HYSTEROSCOPY WITH MYOSURE N/A 01/15/2020   Procedure: DILATATION & CURETTAGE/HYSTEROSCOPY WITH MYOSURE;  Surgeon: Arvella Nigh, MD;  Location: [facility];  Service: Gynecology;  Laterality: N/A;  pt in wheelchair-has MS  . DILATION AND CURETTAGE OF UTERUS    . DILATION AND CURETTAGE OF  UTERUS N/A 02/16/2020   Procedure: DILATATION AND CURETTAGE;  Surgeon: Lafonda Mosses, MD;  Location: [facility];  Service: Gynecology;  Laterality: N/A;  NOT GENERAL -NO INTUBATION  . INFUSION PUMP IMPLANTATION  ~ 2009   baclofen infusion in lower abd  . INTRAUTERINE DEVICE (IUD) INSERTION N/A 02/16/2020   Procedure: INTRAUTERINE DEVICE (IUD) INSERTION - MIRENA;  Surgeon: Lafonda Mosses, MD;  Location: [facility];  Service: Gynecology;  Laterality: N/A;  . PAIN PUMP REVISION N/A 07/29/2014   Procedure: Baclofen pump replacement;  Surgeon: Erline Levine, MD;  Location: [facility];  Service: Neurosurgery;  Laterality: N/A;  Baclofen pump replacement  . TONSILLECTOMY AND ADENOIDECTOMY    . VARICOSE VEIN SURGERY  ~ 1968  . VENOUS ABLATION  12/16/2010   radiofreq ablation -Dr Elisabeth Cara and [facility]  . WISDOM TOOTH EXTRACTION     Social History   Occupational History  . Occupation: RETIRED  Tobacco Use  . Smoking status: Former Smoker    Packs/day: 0.50    Years: 7.00    Pack years: 3.50    Types: Cigarettes    Quit date: 08/21/1976    Years since quitting: 43.6  . Smokeless tobacco: Never Used  Vaping Use  . Vaping Use: Never used  Substance and Sexual Activity  . Alcohol use: Not Currently  . Drug use: No  . Sexual activity: Not Currently

## 2020-03-30 ENCOUNTER — Ambulatory Visit (INDEPENDENT_AMBULATORY_CARE_PROVIDER_SITE_OTHER): Payer: Medicare Other | Admitting: Cardiovascular Disease

## 2020-03-30 DIAGNOSIS — Z7901 Long term (current) use of anticoagulants: Secondary | ICD-10-CM

## 2020-03-30 LAB — POCT INR: INR: 2.5 (ref 2.0–3.0)

## 2020-04-01 DIAGNOSIS — I10 Essential (primary) hypertension: Secondary | ICD-10-CM | POA: Diagnosis not present

## 2020-04-01 DIAGNOSIS — N390 Urinary tract infection, site not specified: Secondary | ICD-10-CM | POA: Diagnosis not present

## 2020-04-06 ENCOUNTER — Encounter (HOSPITAL_COMMUNITY): Payer: Self-pay | Admitting: Emergency Medicine

## 2020-04-06 ENCOUNTER — Other Ambulatory Visit: Payer: Self-pay

## 2020-04-06 ENCOUNTER — Emergency Department (HOSPITAL_COMMUNITY): Payer: Medicare Other

## 2020-04-06 ENCOUNTER — Inpatient Hospital Stay (HOSPITAL_COMMUNITY)
Admission: EM | Admit: 2020-04-06 | Discharge: 2020-04-08 | DRG: 690 | Disposition: A | Discharge status: Home Health | Payer: Medicare Other | Attending: Family Medicine | Admitting: Family Medicine

## 2020-04-06 DIAGNOSIS — Z1619 Resistance to other specified beta lactam antibiotics: Secondary | ICD-10-CM | POA: Diagnosis present

## 2020-04-06 DIAGNOSIS — Z83438 Family history of other disorder of lipoprotein metabolism and other lipidemia: Secondary | ICD-10-CM

## 2020-04-06 DIAGNOSIS — Z20822 Contact with and (suspected) exposure to covid-19: Secondary | ICD-10-CM | POA: Diagnosis not present

## 2020-04-06 DIAGNOSIS — I5032 Chronic diastolic (congestive) heart failure: Secondary | ICD-10-CM | POA: Diagnosis not present

## 2020-04-06 DIAGNOSIS — N2889 Other specified disorders of kidney and ureter: Secondary | ICD-10-CM | POA: Diagnosis not present

## 2020-04-06 DIAGNOSIS — Z8616 Personal history of COVID-19: Secondary | ICD-10-CM

## 2020-04-06 DIAGNOSIS — G35 Multiple sclerosis: Secondary | ICD-10-CM | POA: Diagnosis present

## 2020-04-06 DIAGNOSIS — G4733 Obstructive sleep apnea (adult) (pediatric): Secondary | ICD-10-CM | POA: Diagnosis present

## 2020-04-06 DIAGNOSIS — Z87891 Personal history of nicotine dependence: Secondary | ICD-10-CM

## 2020-04-06 DIAGNOSIS — Z8701 Personal history of pneumonia (recurrent): Secondary | ICD-10-CM

## 2020-04-06 DIAGNOSIS — I110 Hypertensive heart disease with heart failure: Secondary | ICD-10-CM | POA: Diagnosis present

## 2020-04-06 DIAGNOSIS — I4821 Permanent atrial fibrillation: Secondary | ICD-10-CM | POA: Diagnosis present

## 2020-04-06 DIAGNOSIS — K219 Gastro-esophageal reflux disease without esophagitis: Secondary | ICD-10-CM | POA: Diagnosis present

## 2020-04-06 DIAGNOSIS — E785 Hyperlipidemia, unspecified: Secondary | ICD-10-CM | POA: Diagnosis present

## 2020-04-06 DIAGNOSIS — R609 Edema, unspecified: Secondary | ICD-10-CM | POA: Diagnosis not present

## 2020-04-06 DIAGNOSIS — Z818 Family history of other mental and behavioral disorders: Secondary | ICD-10-CM

## 2020-04-06 DIAGNOSIS — E559 Vitamin D deficiency, unspecified: Secondary | ICD-10-CM | POA: Diagnosis present

## 2020-04-06 DIAGNOSIS — K5909 Other constipation: Secondary | ICD-10-CM | POA: Diagnosis present

## 2020-04-06 DIAGNOSIS — R000 Tachycardia, unspecified: Secondary | ICD-10-CM | POA: Diagnosis not present

## 2020-04-06 DIAGNOSIS — I10 Essential (primary) hypertension: Secondary | ICD-10-CM | POA: Diagnosis not present

## 2020-04-06 DIAGNOSIS — K3189 Other diseases of stomach and duodenum: Secondary | ICD-10-CM | POA: Diagnosis not present

## 2020-04-06 DIAGNOSIS — Z79890 Hormone replacement therapy (postmenopausal): Secondary | ICD-10-CM

## 2020-04-06 DIAGNOSIS — Z8349 Family history of other endocrine, nutritional and metabolic diseases: Secondary | ICD-10-CM

## 2020-04-06 DIAGNOSIS — R140 Abdominal distension (gaseous): Secondary | ICD-10-CM | POA: Diagnosis not present

## 2020-04-06 DIAGNOSIS — N390 Urinary tract infection, site not specified: Principal | ICD-10-CM | POA: Diagnosis present

## 2020-04-06 DIAGNOSIS — Z981 Arthrodesis status: Secondary | ICD-10-CM

## 2020-04-06 DIAGNOSIS — Z85528 Personal history of other malignant neoplasm of kidney: Secondary | ICD-10-CM

## 2020-04-06 DIAGNOSIS — D1809 Hemangioma of other sites: Secondary | ICD-10-CM | POA: Diagnosis not present

## 2020-04-06 DIAGNOSIS — Z86718 Personal history of other venous thrombosis and embolism: Secondary | ICD-10-CM

## 2020-04-06 DIAGNOSIS — I4891 Unspecified atrial fibrillation: Secondary | ICD-10-CM | POA: Diagnosis not present

## 2020-04-06 DIAGNOSIS — F419 Anxiety disorder, unspecified: Secondary | ICD-10-CM | POA: Diagnosis present

## 2020-04-06 DIAGNOSIS — B964 Proteus (mirabilis) (morganii) as the cause of diseases classified elsewhere: Secondary | ICD-10-CM | POA: Diagnosis present

## 2020-04-06 DIAGNOSIS — Z888 Allergy status to other drugs, medicaments and biological substances status: Secondary | ICD-10-CM

## 2020-04-06 DIAGNOSIS — Z7901 Long term (current) use of anticoagulants: Secondary | ICD-10-CM

## 2020-04-06 DIAGNOSIS — F329 Major depressive disorder, single episode, unspecified: Secondary | ICD-10-CM | POA: Diagnosis present

## 2020-04-06 DIAGNOSIS — Z8249 Family history of ischemic heart disease and other diseases of the circulatory system: Secondary | ICD-10-CM

## 2020-04-06 DIAGNOSIS — M170 Bilateral primary osteoarthritis of knee: Secondary | ICD-10-CM | POA: Diagnosis present

## 2020-04-06 DIAGNOSIS — I700 Atherosclerosis of aorta: Secondary | ICD-10-CM | POA: Diagnosis not present

## 2020-04-06 DIAGNOSIS — G4730 Sleep apnea, unspecified: Secondary | ICD-10-CM | POA: Diagnosis present

## 2020-04-06 DIAGNOSIS — E039 Hypothyroidism, unspecified: Secondary | ICD-10-CM | POA: Diagnosis present

## 2020-04-06 DIAGNOSIS — Z885 Allergy status to narcotic agent status: Secondary | ICD-10-CM

## 2020-04-06 DIAGNOSIS — Z79899 Other long term (current) drug therapy: Secondary | ICD-10-CM

## 2020-04-06 LAB — CBC WITH DIFFERENTIAL/PLATELET
Abs Immature Granulocytes: 0.03 10*3/uL (ref 0.00–0.07)
Basophils Absolute: 0.1 10*3/uL (ref 0.0–0.1)
Basophils Relative: 1 %
Eosinophils Absolute: 0.2 10*3/uL (ref 0.0–0.5)
Eosinophils Relative: 2 %
HCT: 46.8 % — ABNORMAL HIGH (ref 36.0–46.0)
Hemoglobin: 15.3 g/dL — ABNORMAL HIGH (ref 12.0–15.0)
Immature Granulocytes: 0 %
Lymphocytes Relative: 29 %
Lymphs Abs: 2.7 10*3/uL (ref 0.7–4.0)
MCH: 31.3 pg (ref 26.0–34.0)
MCHC: 32.7 g/dL (ref 30.0–36.0)
MCV: 95.7 fL (ref 80.0–100.0)
Monocytes Absolute: 1.3 10*3/uL — ABNORMAL HIGH (ref 0.1–1.0)
Monocytes Relative: 14 %
Neutro Abs: 5.1 10*3/uL (ref 1.7–7.7)
Neutrophils Relative %: 54 %
Platelets: 259 10*3/uL (ref 150–400)
RBC: 4.89 MIL/uL (ref 3.87–5.11)
RDW: 12.9 % (ref 11.5–15.5)
WBC: 9.4 10*3/uL (ref 4.0–10.5)
nRBC: 0 % (ref 0.0–0.2)

## 2020-04-06 LAB — COMPREHENSIVE METABOLIC PANEL
ALT: 17 U/L (ref 0–44)
AST: 17 U/L (ref 15–41)
Albumin: 3.6 g/dL (ref 3.5–5.0)
Alkaline Phosphatase: 55 U/L (ref 38–126)
Anion gap: 11 (ref 5–15)
BUN: 27 mg/dL — ABNORMAL HIGH (ref 8–23)
CO2: 31 mmol/L (ref 22–32)
Calcium: 9.3 mg/dL (ref 8.9–10.3)
Chloride: 96 mmol/L — ABNORMAL LOW (ref 98–111)
Creatinine, Ser: 0.74 mg/dL (ref 0.44–1.00)
GFR calc Af Amer: >60 mL/min (ref >60)
GFR calc non Af Amer: >60 mL/min (ref >60)
Glucose, Bld: 118 mg/dL — ABNORMAL HIGH (ref 70–99)
Potassium: 4.2 mmol/L (ref 3.5–5.1)
Sodium: 138 mmol/L (ref 135–145)
Total Bilirubin: 0.4 mg/dL (ref 0.3–1.2)
Total Protein: 6.4 g/dL — ABNORMAL LOW (ref 6.5–8.1)

## 2020-04-06 LAB — URINALYSIS, ROUTINE W REFLEX MICROSCOPIC
Bilirubin Urine: NEGATIVE
Glucose, UA: NEGATIVE mg/dL
Ketones, ur: NEGATIVE mg/dL
Nitrite: POSITIVE — AB
Protein, ur: 30 mg/dL — AB
Specific Gravity, Urine: 1.016 (ref 1.005–1.030)
WBC, UA: >50 WBC/hpf — ABNORMAL HIGH (ref 0–5)
pH: 6 (ref 5.0–8.0)

## 2020-04-06 LAB — LIPASE, BLOOD: Lipase: 35 U/L (ref 11–51)

## 2020-04-06 MED ORDER — DILTIAZEM HCL 25 MG/5ML IV SOLN
10.0000 mg | Freq: Once | INTRAVENOUS | Status: AC
  Administered 2020-04-06: 10 mg via INTRAVENOUS
  Filled 2020-04-06: qty 5

## 2020-04-06 MED ORDER — IOHEXOL 300 MG/ML  SOLN
100.0000 mL | Freq: Once | INTRAMUSCULAR | Status: AC | PRN
  Administered 2020-04-06: 100 mL via INTRAVENOUS

## 2020-04-06 MED ORDER — SODIUM CHLORIDE 0.9 % IV SOLN
2.0000 g | Freq: Once | INTRAVENOUS | Status: AC
  Administered 2020-04-07: 2 g via INTRAVENOUS
  Filled 2020-04-06: qty 20

## 2020-04-06 NOTE — ED Notes (Signed)
To CT

## 2020-04-06 NOTE — ED Provider Notes (Signed)
[facility] EMERGENCY DEPARTMENT Provider Note   CSN: [id number] Arrival date & time: 04/06/20  2028     History Chief Complaint  Patient presents with  . Atrial Fibrillation    Toni [name] is a 74 y.o. female with history of persistent A. fib, hypertension, hyperlipidemia, DVT, MS, OSA presents for evaluation of acute onset, persistent and progressively worsening abdominal distention.  She states that she has experienced abdominal distention intermittently for a few years but over the last several days it has been worse than her baseline.  Endorses generalized soreness to the abdomen.  She also notes feeling constipated.  She last had a bowel movement yesterday which was normal for her, no melena or hematochezia.  She typically has 2 or 3 bowel movements daily.  She feels as though she has a UTI.  She states that she has been treated with 2 courses of Macrobid and a course of Keflex with persistent symptoms which include dysuria, urgency, frequency, cloudy urine and sediment noted in the urine. She tells me that she has felt "out of my mind" and confused which occurs when she gets UTIs. She does feel generally weak.  Daughter at the bedside states that the patient used to be on prophylactic antibiotics for frequent UTIs with her old urologist who has since retired.  She states that a few weeks ago the patient was started on a course of Macrobid by her urologist, which she completed with persistent symptoms.  She went to her PCP who started her on a course of Macrobid but then called 2 days into the course and switched her to Keflex based on urine culture and sensitivity.  She continued to have persistent UTI symptoms so on Thursday they provided another urine sample with the PCP.  Today they received a phone call from the office recommending another course of Keflex (initially recommended cipro but this is not compatible with her warfarin).  She has had persistent nausea since  being diagnosed with Covid in December 2020.   Reports compliance with her home medications including her warfarin and her diltiazem.  Denies lightheadedness, shortness of breath, chest pain or leg swelling.  She does not typically know when she goes into A. fib with RVR but is in A. fib all the time.  She states that when she notes that her heart rate is elevated she typically takes a dose of diltiazem 60 mg at the recommendation of her cardiologist. The history is provided by the patient.       Past Medical History:  Diagnosis Date  . Abnormality of gait 03/01/2015  . Anxiety   . Arthritis    "knees" (01/19/2016)  . Asthma    as a child   . Back pain   . Cancer ([place])    / RENAL CELL CARCINOMA - GETS CT SCAN EVERY 6 MONTHS TO WATCH   . Chronic pain    "nerve pain from the MS"  . Complex atypical endometrial hyperplasia   . Constipation   . Depression   . DVT (deep venous thrombosis) ([place]) 1983   "RLE; may have just been phlebitis; it was before the age of dopplers"  . Dysrhythmia    afib   . 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 GERD (gastroesophageal reflux disease)   . HLD (hyperlipidemia)   . Hypotension   . Hypothyroidism   . Joint pain   .  Multiple sclerosis ([place])    has had this may 1989-[facility] reg doc  . OSA on CPAP    bipap machine - SLEEP APNEA WORSE SINCE COVID IN 1/21 PER PATIENT SETTING HAS INCREASED FROM 9 TO 30   . Osteoarthritis   . PAF (paroxysmal atrial fibrillation) ([place])    on coumadin; documented on monitor 06/2010  . Pneumonia 11/2011  . Sleep apnea     Patient Active Problem List   Diagnosis Date Noted  . 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 Oxygen dependent 02/26/2020  . Neurogenic hypoventilation 02/26/2020  . Complex atypical endometrial hyperplasia   . COVID-19 virus infection 08/23/2019  . Bilateral leg weakness 08/04/2019  . Weakness 08/04/2019  . Unilateral primary  osteoarthritis, left knee 01/09/2019  . Type 2 diabetes mellitus without complication, without long-term current use of insulin ([place]) 08/27/2018  . Obstructive sleep apnea treated with bilevel positive airway pressure (BiPAP) 08/15/2018  . 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 ([place]) 11/21/2017  . Permanent atrial fibrillation ([place]) 11/21/2017  . Tightness of leg fascia 11/21/2017  . Muscle spasm of both lower legs 11/21/2017  . Carpal tunnel syndrome, left upper limb 09/13/2017  . Carpal tunnel syndrome, right upper limb 09/13/2017  . Other insomnia 06/11/2017  . Depression 06/11/2017  . Vitamin D deficiency 03/22/2017  . Right hand weakness 08/06/2016  . Nonischemic cardiomyopathy ([place]) 05/09/2016  . Localized edema 05/09/2016  . Long term current use of anticoagulant therapy 02/03/2016  . 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 Obesity hypoventilation syndrome ([place]) 03/30/2015  . Abnormality of gait 03/01/2015  . Chronic constipation with overflow incontinence 01/25/2015  . 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) ([place]) 01/25/2015  . 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 Varicose veins of both lower extremities with complications 67/89/3810  . Long term (current) use of anticoagulants 11/05/2012  . 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 Essential hypertension   . Thyroid disease   . HLD (hyperlipidemia)     Past Surgical History:  Procedure Laterality Date  . ANTERIOR CERVICAL DECOMP/DISCECTOMY FUSION  01/2004  . CARDIAC CATHETERIZATION  06/22/2010   normal coronary arteries, PAF  . CATARACT EXTRACTION, BILATERAL Bilateral   . CHILD BIRTHS     X2  . DILATATION & CURETTAGE/HYSTEROSCOPY WITH MYOSURE N/A 01/15/2020   Procedure: DILATATION & CURETTAGE/HYSTEROSCOPY WITH MYOSURE;  Surgeon: Arvella Nigh,  MD;  Location: [facility];  Service: Gynecology;  Laterality: N/A;  pt in wheelchair-has MS  . DILATION AND CURETTAGE OF UTERUS    . DILATION AND CURETTAGE OF UTERUS N/A 02/16/2020   Procedure: DILATATION AND CURETTAGE;  Surgeon: Lafonda Mosses, MD;  Location: [facility];  Service: Gynecology;  Laterality: N/A;  NOT GENERAL -NO INTUBATION  . INFUSION PUMP IMPLANTATION  ~ 2009   baclofen infusion in lower abd  . INTRAUTERINE DEVICE (IUD) INSERTION N/A 02/16/2020   Procedure: INTRAUTERINE DEVICE (IUD) INSERTION - MIRENA;  Surgeon: Lafonda Mosses, MD;  Location: [facility];  Service: Gynecology;  Laterality: N/A;  . PAIN PUMP REVISION N/A 07/29/2014   Procedure: Baclofen pump replacement;  Surgeon: Erline Levine, MD;  Location: [facility];  Service: Neurosurgery;  Laterality: N/A;  Baclofen pump replacement  . TONSILLECTOMY AND ADENOIDECTOMY    . VARICOSE VEIN SURGERY  ~ 1968  . VENOUS ABLATION  12/16/2010   radiofreq ablation -Dr Elisabeth Cara and [facility]  .  WISDOM TOOTH EXTRACTION       OB History    Gravida  2   Para      Term      Preterm      AB      Living        SAB      TAB      Ectopic      Multiple      Live Births              Family History  Problem Relation Age of Onset  . Coronary artery disease Father        at age 38  . Hyperlipidemia Father   . Thyroid disease Father   . Coronary artery disease Maternal Grandmother   . Depression Maternal Grandmother   . Cancer Paternal Grandmother        breast  . Depression Mother   . Sudden death Mother   . Anxiety disorder Mother   . Alcoholism Son     Social History   Tobacco Use  . Smoking status: Former Smoker    Packs/day: 0.50    Years: 7.00    Pack years: 3.50    Types: Cigarettes    Quit date: 08/21/1976    Years since quitting: 43.6  . Smokeless tobacco: Never Used  Vaping Use  . Vaping Use: Never used  Substance Use Topics  . Alcohol use: Not Currently  . Drug use: No    Home  Medications Prior to Admission medications   Medication Sig Start Date End Date Taking? Authorizing Provider  acetaminophen (TYLENOL) 500 MG tablet Take 500-1,000 mg by mouth every 6 (six) hours as needed for fever or headache (or pain).    [provider]  ALPRAZolam Duanne Moron) 0.5 MG tablet Take 1 tablet (0.5 mg total) by mouth 3 (three) times daily as needed for anxiety or sleep. 02/26/20   Dohmeier, Asencion Partridge, MD  baclofen (LIORESAL) 20 MG tablet Once to two tablets a day for breakthrough spasms Patient taking differently: Take 20 mg by mouth 2 (two) times daily as needed (Pump needs a boot).  08/27/18   Dohmeier, Asencion Partridge, MD  buPROPion (WELLBUTRIN XL) 300 MG 24 hr tablet Take 1 tablet (300 mg total) by mouth daily. 08/23/17   Dennard Nip D, MD  Calcium Carbonate-Vit D-Min (CALTRATE 600+D PLUS MINERALS) 600-800 MG-UNIT TABS Take 1 tablet by mouth in the morning and at bedtime.     [provider]  cetirizine (ZYRTEC) 10 MG tablet Take 10 mg by mouth daily as needed for allergies.     [provider]  Cholecalciferol (VITAMIN D) 2000 units tablet Take 1 tablet (2,000 Units total) by mouth 2 (two) times daily. 03/22/17   Dennard Nip D, MD  Coenzyme Q10 (CO Q-10 PO) Take 1 tablet by mouth daily.    [provider]  corticotropin (ACTHAR) 80 UNIT/ML injectable gel Inject 1 mL (80 Units total) into the muscle every other day. For 10 days each month. Patient taking differently: Inject 80 Units into the muscle See admin instructions. 5 shots a month given at home 12/01/19   Dohmeier, Asencion Partridge, MD  Cranberry 1000 MG CAPS Take 2,000 mg by mouth 2 (two) times daily.     [provider]  diltiazem (CARDIZEM CD) 240 MG 24 hr capsule Take 1 capsule (240 mg total) by mouth daily. 01/28/20   Hilty, Nadean Corwin, MD  diltiazem (CARDIZEM) 60 MG tablet Take 1 tablet (60 mg  total) by mouth every 6 (six) hours as needed for up to 7 days (HR sustained >110bpm). 10/17/19 02/10/20  Terrilee Croak, MD  docusate sodium (COLACE) 100 MG capsule Take 1 capsule (100 mg total) by mouth daily. 08/12/16   Rai, Vernelle Emerald, MD  famotidine (PEPCID) 20 MG tablet Take 20 mg by mouth at bedtime.  06/11/18   [provider]  fluticasone (FLONASE) 50 MCG/ACT nasal spray Place 2 sprays into both nostrils daily as needed for allergies.  03/23/15   [provider]  furosemide (LASIX) 20 MG tablet Take 1 tablet by mouth daily, may take second dose if needed for excess edema Patient taking differently: Take 20 mg by mouth daily. may take second dose if needed for excess edema 09/30/19   Hilty, Nadean Corwin, MD  gabapentin (NEURONTIN) 300 MG capsule TAKE 1 CAPSULE BY MOUTH UP  TO 4 TIMES DAILY 03/02/20   Dohmeier, Asencion Partridge, MD  lactulose, encephalopathy, (GENERLAC) 10 GM/15ML SOLN TAKE 15 ML BY MOUTH TWICE DAILY AS NEEDED 03/25/20   Dohmeier, Asencion Partridge, MD  levothyroxine (SYNTHROID, LEVOTHROID) 75 MCG tablet Take 75 mcg by mouth daily.    [provider]  melatonin 5 MG TABS Take 5 mg by mouth at bedtime as needed (sleep).     [provider]  midodrine (PROAMATINE) 5 MG tablet Take 1 tablet (5 mg total) by mouth 3 (three) times daily with meals. Patient taking differently: Take 5 mg by mouth 3 (three) times daily as needed (To raise blood pressure).  09/30/19   Hilty, Nadean Corwin, MD  Omega-3 Fatty Acids (FISH OIL PO) Take 1 capsule by mouth daily.    [provider]  omeprazole (PRILOSEC) 20 MG capsule Take 20 mg by mouth daily.     [provider]  ondansetron (ZOFRAN) 4 MG tablet Take 4-8 mg by mouth every 8 (eight) hours as needed for nausea or vomiting.     [provider]  oxybutynin (DITROPAN) 5 MG tablet Take 5 mg by mouth 3 (three) times daily. 02/09/20   [provider]  oxyCODONE-acetaminophen (PERCOCET/ROXICET) 5-325 MG tablet Take 1 tablet by mouth every 8 (eight) hours as needed for severe pain. 02/10/20   McDonald, Mia A, PA-C  psyllium  (METAMUCIL SMOOTH TEXTURE) 28 % packet Take 1 packet by mouth daily.     [provider]  simvastatin (ZOCOR) 10 MG tablet Take 10 mg by mouth every evening.  03/01/18   [provider]  SYRINGE/NEEDLE, DISP, 1 ML (BD LUER-LOK SYRINGE) 25G X 5/8" 1 ML MISC Use as directed to inject  Acthar 06/15/15   Dohmeier, Asencion Partridge, MD  Tamsulosin HCl (FLOMAX) 0.4 MG CAPS Take 0.4 mg by mouth 2 (two) times daily as needed (bladder emptying).     [provider]  warfarin (COUMADIN) 5 MG tablet Take 1/2 to 1 tablet by mouth daily as directed by coumadin clinic. Patient taking differently: Take 5-7.5 mg by mouth See admin instructions. Take 5 mg every day of the week Except Monday take 7.5 mg 02/09/20   Pixie Casino, MD    Allergies    Chlorhexidine, Hydrocodone, Oxycodone, Chlorhexidine gluconate, and Zoloft [sertraline]  Review of Systems   Review of Systems  Constitutional: Negative for chills and fever.  Respiratory: Negative for cough.   Cardiovascular: Negative for chest pain.  Gastrointestinal: Positive for abdominal distention, constipation and nausea. Negative for blood in stool and vomiting.  Genitourinary: Positive for dysuria, frequency and urgency. Negative for hematuria.  All other systems reviewed and are negative.   Physical Exam Updated Vital Signs BP 105/64   Pulse 66   Temp 98.4 F (36.9 C) (Oral)   Resp 13   SpO2 96%   Physical Exam Vitals and nursing note reviewed.  Constitutional:      General: She is not in acute distress.    Appearance: She is well-developed. She is obese.  HENT:     Head: Normocephalic and atraumatic.  Eyes:     General:        Right eye: No discharge.        Left eye: No discharge.     Conjunctiva/sclera: Conjunctivae normal.  Neck:     Vascular: No JVD.     Trachea: No tracheal deviation.  Cardiovascular:     Rate and Rhythm: Tachycardia present. Rhythm irregular.     Pulses: Normal pulses.  Pulmonary:      Effort: Pulmonary effort is normal.     Breath sounds: Normal breath sounds.  Abdominal:     General: Abdomen is protuberant. There is no distension.     Palpations: Abdomen is soft.     Tenderness: There is abdominal tenderness in the left lower quadrant.     Comments: Generalized discomfort on palpation of the abdomen, tenderness along the left lower quadrant.  Skin:    Findings: No erythema.  Neurological:     Mental Status: She is alert.  Psychiatric:        Behavior: Behavior normal.     ED Results / Procedures / Treatments   Labs (all labs ordered are listed, but only abnormal results are displayed) Labs Reviewed  CBC WITH DIFFERENTIAL/PLATELET - Abnormal; Notable for the following components:      Result Value   Hemoglobin 15.3 (*)    HCT 46.8 (*)    Monocytes Absolute 1.3 (*)    All other components within normal limits  COMPREHENSIVE METABOLIC PANEL - Abnormal; Notable for the following components:   Chloride 96 (*)    Glucose, Bld 118 (*)    BUN 27 (*)    Total Protein 6.4 (*)    All other components within normal limits  URINALYSIS, ROUTINE W REFLEX MICROSCOPIC - Abnormal; Notable for the following components:   Color, Urine AMBER (*)    APPearance HAZY (*)    Hgb urine dipstick SMALL (*)    Protein, ur 30 (*)    Nitrite POSITIVE (*)    Leukocytes,Ua LARGE (*)    WBC, UA >50 (*)    Bacteria, UA RARE (*)    All other components within normal limits  URINE CULTURE  SARS CORONAVIRUS 2 BY RT PCR (HOSPITAL ORDER, [facility])  LIPASE, BLOOD    EKG EKG Interpretation  Date/Time:  Tuesday April 06 2020 20:38:02 EDT Ventricular Rate:  110 PR Interval:    QRS Duration: 108 QT Interval:  321 QTC Calculation: 435 R Axis:   72 Text Interpretation: Atrial fibrillation Confirmed by Dene Gentry [phone]) on 04/06/2020 8:53:59 PM   Radiology CT ABDOMEN PELVIS W CONTRAST  Result Date: 04/06/2020 CLINICAL DATA:  Diverticulitis  suspected EXAM: CT ABDOMEN AND PELVIS WITH CONTRAST TECHNIQUE: Multidetector CT imaging of the abdomen and pelvis was performed using the standard protocol following bolus administration of intravenous contrast. CONTRAST:  151mL OMNIPAQUE IOHEXOL 300 MG/ML  SOLN COMPARISON:  CT 08/04/2019, 02/25/2019 FINDINGS: Lower chest: Pectus deformity of the chest (Haller index 3.75) some associated compression of heart, similar to  comparison with borderline cardiomegaly. Calcified granulomata noted in the lower lobes. Stable 3 mm solid nodule in the posterior left lower lobe (5/16). No consolidation or effusion. Hepatobiliary: No worrisome focal liver lesions. Smooth liver surface contour. Normal hepatic attenuation. Gallbladder partially contracted but otherwise unremarkable. No visible calcified gallstones. No pericholecystic inflammation. No biliary ductal dilatation. Pancreas: Unremarkable. No pancreatic ductal dilatation or surrounding inflammatory changes. Spleen: Normal in size. No concerning splenic lesions. Adrenals/Urinary Tract: Normal adrenal glands. Few subcentimeter hypoattenuating foci in the left kidney similar to prior. too small to fully characterize on CT imaging but statistically likely benign. Stable 618 mm heterogeneous enhancing lesion arising from the upper pole left kidney unchanged since 02/25/2019 multiphase exam (6/90). No other concerning renal lesions. Left extrarenal pelvis is noted. Some mild bilateral urothelial thickening is noted. No urolithiasis or hydronephrosis. Extensive circumferential bladder wall thickening with anti dependent focus of gas in the bladder lumen. Stomach/Bowel: Unremarkable distal esophagus. Stomach moderately distended with ingested material. Duodenum is unremarkable with a normal sweep across the midline abdomen. No small bowel thickening or dilatation. A normal appendix is visualized. Large colonic stool burden is noted. Few noninflamed colonic diverticula. No colonic  thickening or dilatation. Vascular/Lymphatic: Atherosclerotic calcifications within the abdominal aorta and branch vessels. No aneurysm or ectasia. No enlarged abdominopelvic lymph nodes. Reproductive: Retroverted uterus with normally positioned radiopaque IUD. Possible cystic structure in the right adnexa measuring up to 3.1 cm in size, greater than expected for typical quiescent ovarian tissue in a patient of this age. Left adnexa is unremarkable. Other: Circumferential body wall edema. Neuro stimulator pack in the soft tissues of the right abdominal wall. Leads course to the L5-S1 inter spinous space and into the canal, terminating above the level of imaging. No lead discontinuity or fracture. Musculoskeletal: The osseous structures appear diffusely demineralized which may limit detection of small or nondisplaced fractures. Multilevel degenerative changes are present in the imaged portions of the spine. Vertebral hemangiomata seen T10, L4. Moderate to severe multilevel discogenic and facet degenerative changes maximal L3-L5. Some marked external rotation of the right hip is noted. Possibly positional. Additional degenerative changes in the hips and pelvis. The osseous structures appear diffusely demineralized which may limit detection of small or nondisplaced fractures. IMPRESSION: 1. No evidence of diverticulitis. Large colonic stool burden, correlate for features of constipation. 2. Extensive circumferential bladder wall thickening with anti dependent focus of gas in the bladder lumen as well as mild urothelial thickening. Findings are concerning for cystitis with ascending urinary tract infection. Correlate with urinalysis. 3. Possible cystic structure in the right adnexa measuring up to 3.1 cm in size, greater than expected for typical quiescent ovarian tissue in a patient of this age. Consider further evaluation with pelvic ultrasound. 4. Unchanged appearance of a heterogeneously enhancing 16 mm lesion in  the upper pole left kidney stable since comparison multiphase CT 02/25/2019. 5. 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 6. Aortic Atherosclerosis (ICD10-I70.0). Electronically Signed   By: Lovena Le M.D.   On: 04/06/2020 22:53    Procedures Procedures (including critical care time)  Medications Ordered in ED Medications  cefTRIAXone (ROCEPHIN) 2 g in sodium chloride 0.9 % 100 mL IVPB (has no administration in time range)  diltiazem (CARDIZEM) injection 10 mg (10 mg Intravenous Given 04/06/20 2128)  iohexol (OMNIPAQUE) 300 MG/ML solution 100 mL (100 mLs Intravenous Contrast Given 04/06/20 2240)    ED Course  I have  reviewed the triage vital signs and the nursing notes.  Pertinent labs & imaging results that were available during my care of the patient were reviewed by me and considered in my medical decision making (see chart for details).    MDM Rules/Calculators/A&P                          Patient presenting for evaluation of urinary symptoms, feeling of abdominal distention/bloating, constipation.  She is afebrile, found to be in A. fib with RVR.  She is in A. fib persistently and states that she cannot typically tell when she goes into RVR but will take an additional dose of Cardizem when she notes her heart rate to be elevated.  We will give one 10mg  dose of Cardizem and reassess.  With regards to her abdominal examination, she has left lower quadrant tenderness and diffuse discomfort on palpation of the abdomen.  Will obtain lab work and CT scan to rule out diverticulitis versus acute surgical abdominal pathology.  Lab work reviewed and interpreted by myself shows no leukocytosis, elevated hemoglobin and hematocrit suggesting dehydration.  BUN and creatinine are mildly uptrending compared to patient's baseline, no metabolic derangements.  UA is concerning for UTI.  We will culture.    CT scan shows findings consistent with constipation as well as UTI versus developing ascending infection.  The patient and daughter expressed frustration that she has been on multiple courses of outpatient antibiotics with no improvement.  Concern for resistant UTI.  With her weakness, confusion, and A. fib with RVR, feel that she would benefit from admission to the hospital for further evaluation and management as well as IV antibiotics.  Unfortunately, we are unable to view the patient's urine culture through our electronic medical record.  Spoke with Jeneen Rinks, ED pharmacist. Recommends 2g rocephin tonight until someone can get ahold of outside urine culture.   Spoke with Dr. Tonie Griffith with [facility] hospitalist service who agrees to assume care of patient and bring her into the hospital for further evaluation and management. Patient is requesting bladder scan to see if she is retaining urine (which is not unusual for her in the setting of her MS) and requests in-and-out cath if she is retaining.   Final Clinical Impression(s) / ED Diagnoses Final diagnoses:  Atrial fibrillation with RVR ([place])  Recurrent UTI    Rx / DC Orders ED Discharge Orders    None       Debroah Baller 04/07/20 0010    Valarie Merino, MD 04/08/20 1640

## 2020-04-06 NOTE — ED Triage Notes (Signed)
Patient is from home, she is having abdominal distension, it is tight from lower abdomen to epigastric area.  She does have rigid lower pelvic area.  Patient does have MS, she does take lasix on a regular basis for edema.  No CHF history per chart.  Patient is in Afib RVR at this time rate of 150.  She denies any CP and shortness of breath.

## 2020-04-07 ENCOUNTER — Encounter (HOSPITAL_COMMUNITY): Payer: Self-pay | Admitting: Family Medicine

## 2020-04-07 DIAGNOSIS — K5909 Other constipation: Secondary | ICD-10-CM | POA: Diagnosis present

## 2020-04-07 DIAGNOSIS — F29 Unspecified psychosis not due to a substance or known physiological condition: Secondary | ICD-10-CM | POA: Diagnosis not present

## 2020-04-07 DIAGNOSIS — Z8616 Personal history of COVID-19: Secondary | ICD-10-CM | POA: Diagnosis not present

## 2020-04-07 DIAGNOSIS — I10 Essential (primary) hypertension: Secondary | ICD-10-CM | POA: Diagnosis not present

## 2020-04-07 DIAGNOSIS — Z1619 Resistance to other specified beta lactam antibiotics: Secondary | ICD-10-CM | POA: Diagnosis present

## 2020-04-07 DIAGNOSIS — I5032 Chronic diastolic (congestive) heart failure: Secondary | ICD-10-CM | POA: Diagnosis present

## 2020-04-07 DIAGNOSIS — Z85528 Personal history of other malignant neoplasm of kidney: Secondary | ICD-10-CM | POA: Diagnosis not present

## 2020-04-07 DIAGNOSIS — Z79890 Hormone replacement therapy (postmenopausal): Secondary | ICD-10-CM | POA: Diagnosis not present

## 2020-04-07 DIAGNOSIS — Z7401 Bed confinement status: Secondary | ICD-10-CM | POA: Diagnosis not present

## 2020-04-07 DIAGNOSIS — G4733 Obstructive sleep apnea (adult) (pediatric): Secondary | ICD-10-CM | POA: Diagnosis present

## 2020-04-07 DIAGNOSIS — I4821 Permanent atrial fibrillation: Secondary | ICD-10-CM | POA: Diagnosis present

## 2020-04-07 DIAGNOSIS — N390 Urinary tract infection, site not specified: Secondary | ICD-10-CM

## 2020-04-07 DIAGNOSIS — I4891 Unspecified atrial fibrillation: Secondary | ICD-10-CM

## 2020-04-07 DIAGNOSIS — I959 Hypotension, unspecified: Secondary | ICD-10-CM | POA: Diagnosis not present

## 2020-04-07 DIAGNOSIS — G35 Multiple sclerosis: Secondary | ICD-10-CM | POA: Diagnosis present

## 2020-04-07 DIAGNOSIS — M170 Bilateral primary osteoarthritis of knee: Secondary | ICD-10-CM | POA: Diagnosis present

## 2020-04-07 DIAGNOSIS — I110 Hypertensive heart disease with heart failure: Secondary | ICD-10-CM | POA: Diagnosis present

## 2020-04-07 DIAGNOSIS — Z86718 Personal history of other venous thrombosis and embolism: Secondary | ICD-10-CM | POA: Diagnosis not present

## 2020-04-07 DIAGNOSIS — M2550 Pain in unspecified joint: Secondary | ICD-10-CM | POA: Diagnosis not present

## 2020-04-07 DIAGNOSIS — E559 Vitamin D deficiency, unspecified: Secondary | ICD-10-CM | POA: Diagnosis present

## 2020-04-07 DIAGNOSIS — F419 Anxiety disorder, unspecified: Secondary | ICD-10-CM | POA: Diagnosis present

## 2020-04-07 DIAGNOSIS — K219 Gastro-esophageal reflux disease without esophagitis: Secondary | ICD-10-CM | POA: Diagnosis present

## 2020-04-07 DIAGNOSIS — Z981 Arthrodesis status: Secondary | ICD-10-CM | POA: Diagnosis not present

## 2020-04-07 DIAGNOSIS — Z8701 Personal history of pneumonia (recurrent): Secondary | ICD-10-CM | POA: Diagnosis not present

## 2020-04-07 DIAGNOSIS — F329 Major depressive disorder, single episode, unspecified: Secondary | ICD-10-CM | POA: Diagnosis present

## 2020-04-07 DIAGNOSIS — N3000 Acute cystitis without hematuria: Secondary | ICD-10-CM | POA: Diagnosis not present

## 2020-04-07 DIAGNOSIS — R5381 Other malaise: Secondary | ICD-10-CM | POA: Diagnosis not present

## 2020-04-07 DIAGNOSIS — Z20822 Contact with and (suspected) exposure to covid-19: Secondary | ICD-10-CM | POA: Diagnosis present

## 2020-04-07 DIAGNOSIS — B964 Proteus (mirabilis) (morganii) as the cause of diseases classified elsewhere: Secondary | ICD-10-CM | POA: Diagnosis present

## 2020-04-07 DIAGNOSIS — E785 Hyperlipidemia, unspecified: Secondary | ICD-10-CM | POA: Diagnosis present

## 2020-04-07 DIAGNOSIS — E039 Hypothyroidism, unspecified: Secondary | ICD-10-CM | POA: Diagnosis present

## 2020-04-07 DIAGNOSIS — Z79899 Other long term (current) drug therapy: Secondary | ICD-10-CM | POA: Diagnosis not present

## 2020-04-07 HISTORY — DX: Urinary tract infection, site not specified: N39.0

## 2020-04-07 LAB — CBC
HCT: 44.2 % (ref 36.0–46.0)
Hemoglobin: 14.2 g/dL (ref 12.0–15.0)
MCH: 30.8 pg (ref 26.0–34.0)
MCHC: 32.1 g/dL (ref 30.0–36.0)
MCV: 95.9 fL (ref 80.0–100.0)
Platelets: 214 10*3/uL (ref 150–400)
RBC: 4.61 MIL/uL (ref 3.87–5.11)
RDW: 12.8 % (ref 11.5–15.5)
WBC: 7.6 10*3/uL (ref 4.0–10.5)
nRBC: 0 % (ref 0.0–0.2)

## 2020-04-07 LAB — BASIC METABOLIC PANEL
Anion gap: 7 (ref 5–15)
BUN: 26 mg/dL — ABNORMAL HIGH (ref 8–23)
CO2: 34 mmol/L — ABNORMAL HIGH (ref 22–32)
Calcium: 9.2 mg/dL (ref 8.9–10.3)
Chloride: 95 mmol/L — ABNORMAL LOW (ref 98–111)
Creatinine, Ser: 0.62 mg/dL (ref 0.44–1.00)
GFR calc Af Amer: >60 mL/min (ref >60)
GFR calc non Af Amer: >60 mL/min (ref >60)
Glucose, Bld: 99 mg/dL (ref 70–99)
Potassium: 4.3 mmol/L (ref 3.5–5.1)
Sodium: 136 mmol/L (ref 135–145)

## 2020-04-07 LAB — PROTIME-INR
INR: 2.6 — ABNORMAL HIGH (ref 0.8–1.2)
Prothrombin Time: 27.2 seconds — ABNORMAL HIGH (ref 11.4–15.2)

## 2020-04-07 LAB — SARS CORONAVIRUS 2 BY RT PCR (HOSPITAL ORDER, PERFORMED IN ~~LOC~~ HOSPITAL LAB): SARS Coronavirus 2: NEGATIVE

## 2020-04-07 MED ORDER — SODIUM CHLORIDE 0.9 % IV SOLN: INTRAVENOUS | Status: DC

## 2020-04-07 MED ORDER — GABAPENTIN 300 MG PO CAPS
300.0000 mg | ORAL_CAPSULE | Freq: Three times a day (TID) | ORAL | Status: DC
  Administered 2020-04-07 – 2020-04-08 (×5): 300 mg via ORAL
  Filled 2020-04-07 (×5): qty 1

## 2020-04-07 MED ORDER — ACETAMINOPHEN 325 MG PO TABS
650.0000 mg | ORAL_TABLET | ORAL | Status: DC | PRN
  Administered 2020-04-07 (×2): 650 mg via ORAL
  Filled 2020-04-07 (×2): qty 2

## 2020-04-07 MED ORDER — WARFARIN SODIUM 5 MG PO TABS
5.0000 mg | ORAL_TABLET | Freq: Once | ORAL | Status: AC
  Administered 2020-04-07: 5 mg via ORAL
  Filled 2020-04-07 (×2): qty 1

## 2020-04-07 MED ORDER — FAMOTIDINE 20 MG PO TABS
20.0000 mg | ORAL_TABLET | Freq: Every day | ORAL | Status: DC
  Administered 2020-04-07: 20 mg via ORAL
  Filled 2020-04-07 (×2): qty 1

## 2020-04-07 MED ORDER — WARFARIN - PHARMACIST DOSING INPATIENT: Freq: Every day | Status: DC

## 2020-04-07 MED ORDER — SIMVASTATIN 20 MG PO TABS
10.0000 mg | ORAL_TABLET | Freq: Every evening | ORAL | Status: DC
  Administered 2020-04-07: 10 mg via ORAL
  Filled 2020-04-07: qty 1

## 2020-04-07 MED ORDER — LACTULOSE 10 GM/15ML PO SOLN: 10.0000 g | Freq: Two times a day (BID) | ORAL | Status: DC | PRN

## 2020-04-07 MED ORDER — MIDODRINE HCL 5 MG PO TABS
5.0000 mg | ORAL_TABLET | Freq: Three times a day (TID) | ORAL | Status: DC
  Administered 2020-04-08 (×2): 5 mg via ORAL
  Filled 2020-04-07 (×2): qty 1

## 2020-04-07 MED ORDER — PSYLLIUM 95 % PO PACK
1.0000 | PACK | Freq: Every evening | ORAL | Status: DC
  Administered 2020-04-07: 1 via ORAL
  Filled 2020-04-07 (×2): qty 1

## 2020-04-07 MED ORDER — ALPRAZOLAM 0.5 MG PO TABS
0.5000 mg | ORAL_TABLET | Freq: Three times a day (TID) | ORAL | Status: DC | PRN
  Administered 2020-04-07 (×2): 0.5 mg via ORAL
  Filled 2020-04-07: qty 2
  Filled 2020-04-07: qty 1

## 2020-04-07 MED ORDER — TAMSULOSIN HCL 0.4 MG PO CAPS
0.4000 mg | ORAL_CAPSULE | Freq: Every morning | ORAL | Status: DC
  Administered 2020-04-08: 0.4 mg via ORAL
  Filled 2020-04-07: qty 1

## 2020-04-07 MED ORDER — BUPROPION HCL ER (XL) 150 MG PO TB24
300.0000 mg | ORAL_TABLET | Freq: Every day | ORAL | Status: DC
  Administered 2020-04-07 – 2020-04-08 (×2): 300 mg via ORAL
  Filled 2020-04-07 (×2): qty 2

## 2020-04-07 MED ORDER — DOCUSATE SODIUM 100 MG PO CAPS
100.0000 mg | ORAL_CAPSULE | Freq: Every day | ORAL | Status: DC
  Administered 2020-04-07 – 2020-04-08 (×2): 100 mg via ORAL
  Filled 2020-04-07 (×2): qty 1

## 2020-04-07 MED ORDER — BACLOFEN 20 MG PO TABS
20.0000 mg | ORAL_TABLET | Freq: Two times a day (BID) | ORAL | Status: DC | PRN
  Administered 2020-04-07: 20 mg via ORAL
  Filled 2020-04-07: qty 1

## 2020-04-07 MED ORDER — LEVOTHYROXINE SODIUM 75 MCG PO TABS
75.0000 ug | ORAL_TABLET | Freq: Every day | ORAL | Status: DC
  Administered 2020-04-07 – 2020-04-08 (×2): 75 ug via ORAL
  Filled 2020-04-07 (×2): qty 1

## 2020-04-07 MED ORDER — WARFARIN SODIUM 5 MG PO TABS: 5.0000 mg | ORAL_TABLET | ORAL | Status: DC

## 2020-04-07 MED ORDER — SODIUM CHLORIDE 0.9 % IV SOLN
2.0000 g | INTRAVENOUS | Status: DC
  Administered 2020-04-08: 2 g via INTRAVENOUS
  Filled 2020-04-07: qty 20
  Filled 2020-04-07: qty 2

## 2020-04-07 MED ORDER — OXYBUTYNIN CHLORIDE 5 MG PO TABS
5.0000 mg | ORAL_TABLET | Freq: Three times a day (TID) | ORAL | Status: DC
  Administered 2020-04-07: 5 mg via ORAL
  Filled 2020-04-07 (×3): qty 1

## 2020-04-07 MED ORDER — MILK AND MOLASSES ENEMA
1.0000 | Freq: Once | RECTAL | Status: AC
  Administered 2020-04-07: 240 mL via RECTAL
  Filled 2020-04-07: qty 240

## 2020-04-07 MED ORDER — ACETAMINOPHEN 500 MG PO TABS
500.0000 mg | ORAL_TABLET | Freq: Four times a day (QID) | ORAL | Status: DC | PRN
  Administered 2020-04-08: 1000 mg via ORAL
  Filled 2020-04-07: qty 2

## 2020-04-07 MED ORDER — DILTIAZEM HCL ER COATED BEADS 240 MG PO CP24
240.0000 mg | ORAL_CAPSULE | Freq: Every day | ORAL | Status: DC
  Administered 2020-04-07 – 2020-04-08 (×2): 240 mg via ORAL
  Filled 2020-04-07 (×2): qty 2
  Filled 2020-04-07 (×2): qty 1

## 2020-04-07 MED ORDER — ONDANSETRON HCL 4 MG/2ML IJ SOLN
4.0000 mg | Freq: Four times a day (QID) | INTRAMUSCULAR | Status: DC | PRN
  Administered 2020-04-07 – 2020-04-08 (×2): 4 mg via INTRAVENOUS
  Filled 2020-04-07 (×2): qty 2

## 2020-04-07 MED ORDER — OXYCODONE-ACETAMINOPHEN 5-325 MG PO TABS
1.0000 | ORAL_TABLET | Freq: Three times a day (TID) | ORAL | Status: DC | PRN
  Administered 2020-04-07: 1 via ORAL
  Filled 2020-04-07: qty 1

## 2020-04-07 MED ORDER — BISACODYL 10 MG RE SUPP
10.0000 mg | Freq: Once | RECTAL | Status: AC
  Administered 2020-04-07: 10 mg via RECTAL
  Filled 2020-04-07: qty 1

## 2020-04-07 NOTE — ED Notes (Signed)
Breakfast Ordered 

## 2020-04-07 NOTE — Discharge Instructions (Addendum)
Toni [name],  You were in the hospital because of a UTI. This was thankfully treatable with Ceftriaxone. I have transitioned you to Cefdinir to complete 7 days. Your INR was elevated this morning. Please hold your Thursday dose and resume your Coumadin on Friday, 8/20.   Information on my medicine - Coumadin   (Warfarin)  This medication education was reviewed with me or my healthcare representative as part of my discharge preparation.  Why was Coumadin prescribed for you? Coumadin was prescribed for you because you have a blood clot or a medical condition that can cause an increased risk of forming blood clots. Blood clots can cause serious health problems by blocking the flow of blood to the heart, lung, or brain. Coumadin can prevent harmful blood clots from forming. As a reminder your indication for Coumadin is:   Stroke Prevention Because Of Atrial Fibrillation  What test will check on my response to Coumadin? While on Coumadin (warfarin) you will need to have an INR test regularly to ensure that your dose is keeping you in the desired range. The INR (international normalized ratio) number is calculated from the result of the laboratory test called prothrombin time (PT).  If an INR APPOINTMENT HAS NOT ALREADY BEEN MADE FOR YOU please schedule an appointment to have this lab work done by your health care provider within 7 days. Your INR goal is usually a number between:  2 to 3 or your provider may give you a more narrow range like 2-2.5.  Ask your health care provider during an office visit what your goal INR is.  What  do you need to  know  About  COUMADIN? Take Coumadin (warfarin) exactly as prescribed by your healthcare provider about the same time each day.  DO NOT stop taking without talking to the doctor who prescribed the medication.  Stopping without other blood clot prevention medication to take the place of Coumadin may increase your risk of developing a new clot or stroke.   Get refills before you run out.  What do you do if you miss a dose? If you miss a dose, take it as soon as you remember on the same day then continue your regularly scheduled regimen the next day.  Do not take two doses of Coumadin at the same time.  Important Safety Information A possible side effect of Coumadin (Warfarin) is an increased risk of bleeding. You should call your healthcare provider right away if you experience any of the following: ? Bleeding from an injury or your nose that does not stop. ? Unusual colored urine (red or dark brown) or unusual colored stools (red or black). ? Unusual bruising for unknown reasons. ? A serious fall or if you hit your head (even if there is no bleeding).  Some foods or medicines interact with Coumadin (warfarin) and might alter your response to warfarin. To help avoid this: ? Eat a balanced diet, maintaining a consistent amount of Vitamin K. ? Notify your provider about major diet changes you plan to make. ? Avoid alcohol or limit your intake to 1 drink for women and 2 drinks for men per day. (1 drink is 5 oz. wine, 12 oz. beer, or 1.5 oz. liquor.)  Make sure that ANY health care provider who prescribes medication for you knows that you are taking Coumadin (warfarin).  Also make sure the healthcare provider who is monitoring your Coumadin knows when you have started a new medication including herbals and non-prescription products.  Coumadin (Warfarin)  Major Drug Interactions  Increased Warfarin Effect Decreased Warfarin Effect  Alcohol (large quantities) Antibiotics (esp. Septra/Bactrim, Flagyl, Cipro) Amiodarone (Cordarone) Aspirin (ASA) Cimetidine (Tagamet) Megestrol (Megace) NSAIDs (ibuprofen, naproxen, etc.) Piroxicam (Feldene) Propafenone (Rythmol SR) Propranolol (Inderal) Isoniazid (INH) Posaconazole (Noxafil) Barbiturates (Phenobarbital) Carbamazepine (Tegretol) Chlordiazepoxide (Librium) Cholestyramine  (Questran) Griseofulvin Oral Contraceptives Rifampin Sucralfate (Carafate) Vitamin K   Coumadin (Warfarin) Major Herbal Interactions  Increased Warfarin Effect Decreased Warfarin Effect  Garlic Ginseng Ginkgo biloba Coenzyme Q10 Green tea St. John's wort    Coumadin (Warfarin) FOOD Interactions  Eat a consistent number of servings per week of foods HIGH in Vitamin K (1 serving =  cup)  Collards (cooked, or boiled & drained) Kale (cooked, or boiled & drained) Mustard greens (cooked, or boiled & drained) Parsley *serving size only =  cup Spinach (cooked, or boiled & drained) Swiss chard (cooked, or boiled & drained) Turnip greens (cooked, or boiled & drained)  Eat a consistent number of servings per week of foods MEDIUM-HIGH in Vitamin K (1 serving = 1 cup)  Asparagus (cooked, or boiled & drained) Broccoli (cooked, boiled & drained, or raw & chopped) Brussel sprouts (cooked, or boiled & drained) *serving size only =  cup Lettuce, raw (green leaf, endive, romaine) Spinach, raw Turnip greens, raw & chopped   These websites have more information on Coumadin (warfarin):  Fail[employer].se; VeganReport.com.au;

## 2020-04-07 NOTE — H&P (Signed)
History and Physical    Toni [name] EYC:[id number] DOB: 1945-10-21 DOA: 04/06/2020  PCP: Prince Solian, MD   Patient coming from: Home  Chief Complaint: Abdominal pain, dysuria  HPI: Toni [name] is a 74 y.o. female with medical history significant for persistent A. fib, hypertension, hyperlipidemia, DVT, MS, OSA presents for evaluation of acute onset, persistent and progressively worsening abdominal pain and distention.  She states that she has experienced abdominal distention intermittently for a few years but over the last several days it has been worse.  She also reports having dysuria and urinary frequency and hesitancy.  States it feels like it has in the past when she had UTI.  She also notes feeling constipated.  She last had a bowel movement yesterday which was normal for her, no melena or hematochezia.  She typically has 2 or 3 bowel movements daily. She states that she has been treated with 2 courses of Macrobid and a course of Keflex with persistent symptoms which include dysuria, urgency, frequency, cloudy urine and sediment noted in the urine. She tells me that she has felt "out of my mind" and confused which occurs when she gets UTIs. She does feel generally weak.  Daughter at the bedside states that the patient used to be on prophylactic antibiotics for frequent UTIs with her old urologist who has since retired.  She states that a few weeks ago the patient was started on a course of Macrobid by her urologist, which she completed with persistent symptoms.  She went to her PCP who started her on a course of Macrobid but then called 2 days into the course and switched her to Keflex based on urine culture and sensitivity.  She continued to have persistent UTI symptoms so on Thursday they provided another urine sample with the PCP.  Today they received a phone call from the office recommending another course of Keflex (initially recommended cipro but this is not compatible with her  warfarin).  Unfortunately we do not have a copy of the urine culture results that were called to the patient today. She has had persistent nausea since being diagnosed with Covid in December 2020. Reports compliance with her home medications including her warfarin and her diltiazem.  Denies lightheadedness, shortness of breath, chest pain or leg swelling.  She does not typically know when she goes into A. fib with RVR but is in A. fib all the time.   ED Course: On the emergency room Ms. Geske was noted to have atrial fibrillation with RVR and was given dose of Cardizem IV.  She does have improvement in her heart rate which is now 95-105.  She denies having any chest pain or palpitations.  She states she has not had any fever or chills.  She denies nausea, vomiting, diarrhea.  Review of Systems:  General: Denies weakness, fever, chills, weight loss, night sweats.  Denies dizziness.  Denies change in appetite HENT: Denies head trauma, headache, denies change in hearing, tinnitus.  Denies nasal congestion or bleeding.  Denies sore throat, sores in mouth.  Denies difficulty swallowing Eyes: Denies blurry vision, pain in eye, drainage.  Denies discoloration of eyes. Neck: Denies pain.  Denies swelling.  Denies pain with movement. Cardiovascular: Denies chest pain, palpitations.  Denies edema.  Denies orthopnea Respiratory: Denies shortness of breath, cough.  Denies wheezing.  Denies sputum production Gastrointestinal: Reports lower abdominal abdominal pain and swelling.  Denies nausea, vomiting, diarrhea.  Denies melena.  Denies hematemesis. Musculoskeletal: Denies limitation of  movement.  Denies deformity or swelling.  Denies pain.  Denies arthralgias or myalgias. Genitourinary: Denies pelvic pain.  Reports urinary frequency and hesitancy.  Reports dysuria.  Skin: Denies rash.  Denies petechiae, purpura, ecchymosis. Neurological: Denies headache.  Denies syncope.  Denies seizure activity.  Denies weakness  or paresthesia.  Denies slurred speech, drooping face.  Denies visual change. Psychiatric: Denies depression, anxiety.  Denies suicidal thoughts or ideation.  Denies hallucinations.  Past Medical History:  Diagnosis Date  . Abnormality of gait 03/01/2015  . Anxiety   . Arthritis    "knees" (01/19/2016)  . Asthma    as a child   . Back pain   . Cancer ([place])    / RENAL CELL CARCINOMA - GETS CT SCAN EVERY 6 MONTHS TO WATCH   . Chronic pain    "nerve pain from the MS"  . Complex atypical endometrial hyperplasia   . Constipation   . Depression   . DVT (deep venous thrombosis) ([place]) 1983   "RLE; may have just been phlebitis; it was before the age of dopplers"  . Dysrhythmia    afib   . 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 GERD (gastroesophageal reflux disease)   . HLD (hyperlipidemia)   . Hypotension   . Hypothyroidism   . Joint pain   . Multiple sclerosis ([place])    has had this may 1989-[facility]me[facility] reg doc  . OSA on CPAP    bipap machine - SLEEP APNEA WORSE SINCE COVID IN 1/21 PER PATIENT SETTING HAS INCREASED FROM 9 TO 30   . Osteoarthritis   . PAF (paroxysmal atrial fibrillation) ([place])    on coumadin; documented on monitor 06/2010  . Pneumonia 11/2011  . Sleep apnea     Past Surgical History:  Procedure Laterality Date  . ANTERIOR CERVICAL DECOMP/DISCECTOMY FUSION  01/2004  . CARDIAC CATHETERIZATION  06/22/2010   normal coronary arteries, PAF  . CATARACT EXTRACTION, BILATERAL Bilateral   . CHILD BIRTHS     X2  . DILATATION & CURETTAGE/HYSTEROSCOPY WITH MYOSURE N/A 01/15/2020   Procedure: DILATATION & CURETTAGE/HYSTEROSCOPY WITH MYOSURE;  Surgeon: Arvella Nigh, MD;  Location: [facility];  Service: Gynecology;  Laterality: N/A;  pt in wheelchair-has MS  . DILATION AND CURETTAGE OF UTERUS    . DILATION AND CURETTAGE OF UTERUS N/A 02/16/2020   Procedure: DILATATION AND CURETTAGE;  Surgeon: Lafonda Mosses, MD;  Location: [facility];   Service: Gynecology;  Laterality: N/A;  NOT GENERAL -NO INTUBATION  . INFUSION PUMP IMPLANTATION  ~ 2009   baclofen infusion in lower abd  . INTRAUTERINE DEVICE (IUD) INSERTION N/A 02/16/2020   Procedure: INTRAUTERINE DEVICE (IUD) INSERTION - MIRENA;  Surgeon: Lafonda Mosses, MD;  Location: [facility];  Service: Gynecology;  Laterality: N/A;  . PAIN PUMP REVISION N/A 07/29/2014   Procedure: Baclofen pump replacement;  Surgeon: Erline Levine, MD;  Location: [place] [facility];  Service: Neurosurgery;  Laterality: N/A;  Baclofen pump replacement  . TONSILLECTOMY AND ADENOIDECTOMY    . VARICOSE VEIN SURGERY  ~ 1968  . VENOUS ABLATION  12/16/2010   radiofreq ablation -Dr Elisabeth Cara and [facility]  . WISDOM TOOTH EXTRACTION      Social History  reports that she quit smoking about 43 years ago. Her smoking use included cigarettes. She has a 3.50 pack-year smoking history. She has never used smokeless tobacco. She reports previous alcohol use. She reports that  she does not use drugs.  Allergies  Allergen Reactions  . Chlorhexidine Hives  . Hydrocodone Nausea And Vomiting    Hydrocodone causes vomiting  . Oxycodone Nausea And Vomiting    Oral oxycodone causes vomiting  . Chlorhexidine Gluconate Rash    Sores at site  . Zoloft [Sertraline] Hives, Swelling and Rash    Family History  Problem Relation Age of Onset  . Coronary artery disease Father        at age 59  . Hyperlipidemia Father   . Thyroid disease Father   . Coronary artery disease Maternal Grandmother   . Depression Maternal Grandmother   . Cancer Paternal Grandmother        breast  . Depression Mother   . Sudden death Mother   . Anxiety disorder Mother   . Alcoholism Son      Prior to Admission medications   Medication Sig Start Date End Date Taking? Authorizing Provider  acetaminophen (TYLENOL) 500 MG tablet Take 500-1,000 mg by mouth every 6 (six) hours as needed for fever or headache (or pain).    [provider]   ALPRAZolam Duanne Moron) 0.5 MG tablet Take 1 tablet (0.5 mg total) by mouth 3 (three) times daily as needed for anxiety or sleep. 02/26/20   Dohmeier, Asencion Partridge, MD  baclofen (LIORESAL) 20 MG tablet Once to two tablets a day for breakthrough spasms Patient taking differently: Take 20 mg by mouth 2 (two) times daily as needed (Pump needs a boot).  08/27/18   Dohmeier, Asencion Partridge, MD  buPROPion (WELLBUTRIN XL) 300 MG 24 hr tablet Take 1 tablet (300 mg total) by mouth daily. 08/23/17   Dennard Nip D, MD  Calcium Carbonate-Vit D-Min (CALTRATE 600+D PLUS MINERALS) 600-800 MG-UNIT TABS Take 1 tablet by mouth in the morning and at bedtime.     [provider]  cetirizine (ZYRTEC) 10 MG tablet Take 10 mg by mouth daily as needed for allergies.     [provider]  Cholecalciferol (VITAMIN D) 2000 units tablet Take 1 tablet (2,000 Units total) by mouth 2 (two) times daily. 03/22/17   Dennard Nip D, MD  Coenzyme Q10 (CO Q-10 PO) Take 1 tablet by mouth daily.    [provider]  corticotropin (ACTHAR) 80 UNIT/ML injectable gel Inject 1 mL (80 Units total) into the muscle every other day. For 10 days each month. Patient taking differently: Inject 80 Units into the muscle See admin instructions. 5 shots a month given at home 12/01/19   Dohmeier, Asencion Partridge, MD  Cranberry 1000 MG CAPS Take 2,000 mg by mouth 2 (two) times daily.     [provider]  diltiazem (CARDIZEM CD) 240 MG 24 hr capsule Take 1 capsule (240 mg total) by mouth daily. 01/28/20   Hilty, Nadean Corwin, MD  diltiazem (CARDIZEM) 60 MG tablet Take 1 tablet (60 mg total) by mouth every 6 (six) hours as needed for up to 7 days (HR sustained >110bpm). 10/17/19 02/10/20  Terrilee Croak, MD  docusate sodium (COLACE) 100 MG capsule Take 1 capsule (100 mg total) by mouth daily. 08/12/16   Rai, Vernelle Emerald, MD  famotidine (PEPCID) 20 MG tablet Take 20 mg by mouth at bedtime.  06/11/18   [provider]  fluticasone (FLONASE) 50 MCG/ACT  nasal spray Place 2 sprays into both nostrils daily as needed for allergies.  03/23/15   [provider]  furosemide (LASIX) 20 MG tablet Take 1 tablet by mouth daily, may take second dose if  needed for excess edema Patient taking differently: Take 20 mg by mouth daily. may take second dose if needed for excess edema 09/30/19   Hilty, Nadean Corwin, MD  gabapentin (NEURONTIN) 300 MG capsule TAKE 1 CAPSULE BY MOUTH UP  TO 4 TIMES DAILY 03/02/20   Dohmeier, Asencion Partridge, MD  lactulose, encephalopathy, (GENERLAC) 10 GM/15ML SOLN TAKE 15 ML BY MOUTH TWICE DAILY AS NEEDED 03/25/20   Dohmeier, Asencion Partridge, MD  levothyroxine (SYNTHROID, LEVOTHROID) 75 MCG tablet Take 75 mcg by mouth daily.    [provider]  melatonin 5 MG TABS Take 5 mg by mouth at bedtime as needed (sleep).     [provider]  midodrine (PROAMATINE) 5 MG tablet Take 1 tablet (5 mg total) by mouth 3 (three) times daily with meals. Patient taking differently: Take 5 mg by mouth 3 (three) times daily as needed (To raise blood pressure).  09/30/19   Hilty, Nadean Corwin, MD  Omega-3 Fatty Acids (FISH OIL PO) Take 1 capsule by mouth daily.    [provider]  omeprazole (PRILOSEC) 20 MG capsule Take 20 mg by mouth daily.     [provider]  ondansetron (ZOFRAN) 4 MG tablet Take 4-8 mg by mouth every 8 (eight) hours as needed for nausea or vomiting.     [provider]  oxybutynin (DITROPAN) 5 MG tablet Take 5 mg by mouth 3 (three) times daily. 02/09/20   [provider]  oxyCODONE-acetaminophen (PERCOCET/ROXICET) 5-325 MG tablet Take 1 tablet by mouth every 8 (eight) hours as needed for severe pain. 02/10/20   McDonald, Mia A, PA-C  psyllium (METAMUCIL SMOOTH TEXTURE) 28 % packet Take 1 packet by mouth daily.     [provider]  simvastatin (ZOCOR) 10 MG tablet Take 10 mg by mouth every evening.  03/01/18   [provider]  SYRINGE/NEEDLE, DISP, 1 ML (BD LUER-LOK SYRINGE) 25G X 5/8" 1 ML  MISC Use as directed to inject  [place] 06/15/15   Dohmeier, Asencion Partridge, MD  Tamsulosin HCl (FLOMAX) 0.4 MG CAPS Take 0.4 mg by mouth 2 (two) times daily as needed (bladder emptying).     [provider]  warfarin (COUMADIN) 5 MG tablet Take 1/2 to 1 tablet by mouth daily as directed by coumadin clinic. Patient taking differently: Take 5-7.5 mg by mouth See admin instructions. Take 5 mg every day of the week Except Monday take 7.5 mg 02/09/20   Pixie Casino, MD    Physical Exam: Vitals:   04/06/20 2130 04/06/20 2200 04/06/20 2219 04/07/20 0010  BP: 118/62 105/64  116/64  Pulse: (!) 106 (!) 57 66 97  Resp: 11 15 13  (!) 24  Temp:      TempSrc:      SpO2: 96% 95% 96% 96%    Constitutional: NAD, calm, comfortable Vitals:   04/06/20 2130 04/06/20 2200 04/06/20 2219 04/07/20 0010  BP: 118/62 105/64  116/64  Pulse: (!) 106 (!) 57 66 97  Resp: 11 15 13  (!) 24  Temp:      TempSrc:      SpO2: 96% 95% 96% 96%   General: WDWN, Alert and oriented x3.  Eyes: EOMI, PERRL, lids and conjunctivae normal.  Sclera nonicteric HENT:  [place]/AT, external ears normal.  Nares patent without epistasis.  Mucous membranes are moist. Posterior pharynx clear of any exudate or lesions.   Neck: Soft, normal range of motion, supple, no masses, no thyromegaly.  Trachea midline Respiratory: clear to auscultation bilaterally, no wheezing, no  crackles. Normal respiratory effort. No accessory muscle use.  Cardiovascular: Irregularly irregular rhythm with normal rate., no murmurs / rubs / gallops.  Mild lower extremity edema. 1+ pedal pulses. No carotid bruits.  Abdomen: Soft, no tenderness, nondistended, no rebound or guarding.  No masses palpated. Bowel sounds normoactive Musculoskeletal: FROM. no clubbing / cyanosis. No joint deformity upper and lower extremities. no contractures. Normal muscle tone.  Skin: Warm, dry, intact no rashes, lesions, ulcers. No induration.  Thinning hair. Neurologic: CN 2-12 grossly  intact.  Normal speech.  Sensation intact, patella DTR +1 bilaterally. Strength 4/5 in all extremities.   Psychiatric: Normal judgment and insight.  Normal mood.    Labs on Admission: I have personally reviewed following labs and imaging studies  CBC: Recent Labs  Lab 04/06/20 2047  WBC 9.4  NEUTROABS 5.1  HGB 15.3*  HCT 46.8*  MCV 95.7  PLT 657    Basic Metabolic Panel: Recent Labs  Lab 04/06/20 2047  NA 138  K 4.2  CL 96*  CO2 31  GLUCOSE 118*  BUN 27*  CREATININE 0.74  CALCIUM 9.3    GFR: CrCl cannot be calculated (Unknown ideal weight.).  Liver Function Tests: Recent Labs  Lab 04/06/20 2047  AST 17  ALT 17  ALKPHOS 55  BILITOT 0.4  PROT 6.4*  ALBUMIN 3.6    Urine analysis:    Component Value Date/Time   COLORURINE AMBER (A) 04/06/2020 2116   APPEARANCEUR HAZY (A) 04/06/2020 2116   APPEARANCEUR Clear 12/18/2014 1156   LABSPEC 1.016 04/06/2020 2116   PHURINE 6.0 04/06/2020 2116   GLUCOSEU NEGATIVE 04/06/2020 2116   HGBUR SMALL (A) 04/06/2020 2116   BILIRUBINUR NEGATIVE 04/06/2020 2116   BILIRUBINUR Negative 12/18/2014 [place] 04/06/2020 2116   PROTEINUR 30 (A) 04/06/2020 2116   UROBILINOGEN 0.2 08/05/2014 0951   NITRITE POSITIVE (A) 04/06/2020 2116   LEUKOCYTESUR LARGE (A) 04/06/2020 2116    Radiological Exams on Admission: CT ABDOMEN PELVIS W CONTRAST  Result Date: 04/06/2020 CLINICAL DATA:  Diverticulitis suspected EXAM: CT ABDOMEN AND PELVIS WITH CONTRAST TECHNIQUE: Multidetector CT imaging of the abdomen and pelvis was performed using the standard protocol following bolus administration of intravenous contrast. CONTRAST:  126mL OMNIPAQUE IOHEXOL 300 MG/ML  SOLN COMPARISON:  CT 08/04/2019, 02/25/2019 FINDINGS: Lower chest: Pectus deformity of the chest (Haller index 3.75) some associated compression of heart, similar to comparison with borderline cardiomegaly. Calcified granulomata noted in the lower lobes. Stable 3 mm solid  nodule in the posterior left lower lobe (5/16). No consolidation or effusion. Hepatobiliary: No worrisome focal liver lesions. Smooth liver surface contour. Normal hepatic attenuation. Gallbladder partially contracted but otherwise unremarkable. No visible calcified gallstones. No pericholecystic inflammation. No biliary ductal dilatation. Pancreas: Unremarkable. No pancreatic ductal dilatation or surrounding inflammatory changes. Spleen: Normal in size. No concerning splenic lesions. Adrenals/Urinary Tract: Normal adrenal glands. Few subcentimeter hypoattenuating foci in the left kidney similar to prior. too small to fully characterize on CT imaging but statistically likely benign. Stable 618 mm heterogeneous enhancing lesion arising from the upper pole left kidney unchanged since 02/25/2019 multiphase exam (6/90). No other concerning renal lesions. Left extrarenal pelvis is noted. Some mild bilateral urothelial thickening is noted. No urolithiasis or hydronephrosis. Extensive circumferential bladder wall thickening with anti dependent focus of gas in the bladder lumen. Stomach/Bowel: Unremarkable distal esophagus. Stomach moderately distended with ingested material. Duodenum is unremarkable with a normal sweep across the midline abdomen. No small bowel thickening or dilatation. A normal appendix  is visualized. Large colonic stool burden is noted. Few noninflamed colonic diverticula. No colonic thickening or dilatation. Vascular/Lymphatic: Atherosclerotic calcifications within the abdominal aorta and branch vessels. No aneurysm or ectasia. No enlarged abdominopelvic lymph nodes. Reproductive: Retroverted uterus with normally positioned radiopaque IUD. Possible cystic structure in the right adnexa measuring up to 3.1 cm in size, greater than expected for typical quiescent ovarian tissue in a patient of this age. Left adnexa is unremarkable. Other: Circumferential body wall edema. Neuro stimulator pack in the soft  tissues of the right abdominal wall. Leads course to the L5-S1 inter spinous space and into the canal, terminating above the level of imaging. No lead discontinuity or fracture. Musculoskeletal: The osseous structures appear diffusely demineralized which may limit detection of small or nondisplaced fractures. Multilevel degenerative changes are present in the imaged portions of the spine. Vertebral hemangiomata seen T10, L4. Moderate to severe multilevel discogenic and facet degenerative changes maximal L3-L5. Some marked external rotation of the right hip is noted. Possibly positional. Additional degenerative changes in the hips and pelvis. The osseous structures appear diffusely demineralized which may limit detection of small or nondisplaced fractures. IMPRESSION: 1. No evidence of diverticulitis. Large colonic stool burden, correlate for features of constipation. 2. Extensive circumferential bladder wall thickening with anti dependent focus of gas in the bladder lumen as well as mild urothelial thickening. Findings are concerning for cystitis with ascending urinary tract infection. Correlate with urinalysis. 3. Possible cystic structure in the right adnexa measuring up to 3.1 cm in size, greater than expected for typical quiescent ovarian tissue in a patient of this age. Consider further evaluation with pelvic ultrasound. 4. Unchanged appearance of a heterogeneously enhancing 16 mm lesion in the upper pole left kidney stable since comparison multiphase CT 02/25/2019. 5. 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 6. Aortic Atherosclerosis (ICD10-I70.0). Electronically Signed   By: Lovena Le M.D.   On: 04/06/2020 22:53    EKG: Independently reviewed.  Rate of 110.  No acute ST elevation or depression.  Normal QTC  Assessment/Plan Principal Problem:   Atrial fibrillation with RVR ([place]) Ms. Brinley is  admitted to medical telemetry floor.  She was given dose of Cardizem 10 mg IV in the emergency room.  Heart rate is improved to the 95-105 range.  We will continue to monitor heart rate and provide further IV Cardizem if needed.  Otherwise we will continue p.o. Cardizem dose in the morning. Monitor serial troponin.  Patient echocardiogram in February of this year showed normal LV and RV function with a EF of 55 to 60%.  Will not repeat echocardiogram at this time  Active Problems:   Essential hypertension Continue home medications.  Monitor blood pressure.    UTI (urinary tract infection) Patient given 2 g of Rocephin in the emergency room.  We will continue 2 g of Rocephin for high-dose antibiotic therapy per pharmacy recommendation.  Patient has been on 2 rounds of [place] and Keflex recently.  Patient had urine culture performed at PCP office.  We will need to contact PCP office in the morning to obtain urine culture results    Multiple sclerosis, primary chronic progressive Continue chronic home medications    Long term current use of anticoagulant therapy  Continue warfarin for anticoagulation with atrial fibrillation    Obstructive sleep apnea treated with bilevel positive airway pressure (BiPAP) Continue home BiPAP use for OSA at night.  RT consulted  to provide machine to use while in the hospital    DVT prophylaxis: Anticoagulated on warfarin which will be continued.  Pharmacy to monitor and dose for A. fib. Code Status:   Full code Family Communication:  Tightness pain discussed with patient and her daughter who is at bedside.  They verbalized understanding and agree with plan.  Further recommendation to follow as clinical indicated Disposition Plan:   Patient is from:  Home  Anticipated DC to:  Home  Anticipated DC date:  Anticipate greater than 2 midnight stay in the hospital to treat acute medical condition  Anticipated DC barriers: No barriers to discharge identified at this  time  Admission status:  Inpatient  Severity of Illness: The appropriate patient status for this patient is INPATIENT. Inpatient status is judged to be reasonable and necessary in order to provide the required intensity of service to ensure the patient's safety. The patient's presenting symptoms, physical exam findings, and initial radiographic and laboratory data in the context of their chronic comorbidities is felt to place them at high risk for further clinical deterioration. Furthermore, it is not anticipated that the patient will be medically stable for discharge from the hospital within 2 midnights of admission. The following factors support the patient status of inpatient.    * I certify that at the point of admission it is my clinical judgment that the patient will require inpatient hospital care spanning beyond 2 midnights from the point of admission due to high intensity of service, high risk for further deterioration and high frequency of surveillance required.Yevonne Aline Dak Szumski MD [facility]  How to contact the [facility] Attending or Consulting provider [place] or covering provider during after hours [place], for this patient?   1. Check the care team in [facility] and look for a) attending/consulting [facility] provider listed and b) the [facility] team listed 2. Log into [email or web address] and use [place] universal password to access. If you do not have the password, please contact the hospital operator. 3. Locate the [facility] provider you are looking for under [facility] and page to a number that you can be directly reached. 4. If you still have difficulty reaching the provider, please page the [facility] (Director on Call) for the Hospitalists listed on amion for assistance.  04/07/2020, 12:42 AM

## 2020-04-07 NOTE — Progress Notes (Signed)
[place] for Coumadin Indication: atrial fibrillation  Allergies  Allergen Reactions  . Chlorhexidine Hives and Other (See Comments)    And, sores appear where applied  . Hydrocodone Nausea And Vomiting and Other (See Comments)    Hydrocodone causes vomiting  . Oxycodone Nausea And Vomiting and Other (See Comments)    Oral oxycodone causes vomiting  . Chlorhexidine Gluconate Rash and Other (See Comments)    Sores at site applied  . Zoloft [Sertraline] Hives, Swelling and Rash    Patient Measurements:    Vital Signs: BP: 110/63 (08/18 0807) Pulse Rate: 78 (08/18 0807)  Labs: Recent Labs    04/06/20 2047 04/07/20 0342  HGB 15.3* 14.2  HCT 46.8* 44.2  PLT 259 214  LABPROT  --  27.2*  INR  --  2.6*  CREATININE 0.74 0.62    CrCl cannot be calculated (Unknown ideal weight.).   Assessment: 74 y.o. female admitted with abdominal pain and dysuria.  Pharmacy consulted to continue Coumadin from PTA for history of Afib.  INR has been therapeutic on home regimen of 5mg  daily except 7.5mg  on Mondays.  No bleeding reported.  Goal of Therapy:  INR 2-3 Monitor platelets by anticoagulation protocol: Yes   Plan:  Coumadin 5mg  PO today Daily PT / INR  Natale Barba D. Mina Marble, PharmD, BCPS, [place] 04/07/2020, 9:35 AM

## 2020-04-07 NOTE — Progress Notes (Signed)
Patient seen and examined at bedside, patient admitted after midnight, please see earlier detailed admission note by Eben Burow, MD. Briefly, patient presented secondary to abdominal pain and dysuria with evidence of untreated UTI.    General exam: Appears calm and comfortable  Respiratory system: Clear to auscultation. Respiratory effort normal. Cardiovascular system: S1 & S2 heard, RRR. No murmurs, rubs, gallops or clicks. Gastrointestinal system: Abdomen is nondistended, soft and nontender. No organomegaly or masses felt. Normal bowel sounds heard. Central nervous system: Alert and oriented. No focal neurological deficits. Musculoskeletal: No edema. No calf tenderness Skin: No cyanosis. No rashes Psychiatry: Judgement and insight appear normal. Mood & affect appropriate.   Family communication: Daughter at bedside Disposition: Likely home in 1-2 days pending urine culture results and transition to oral antibiotics   A/P:  UTI -Continue Ceftriaxone and await culture data -Request outpatient documentation for urine culture  Chronic constipation -Dulcolax suppository, M/M enema -Resume home Lactulose  Permanent afib w/ RVR Restarted on home Cardizem. Rate improved after IV Cardizem in the ED. -Continue Coumadin per pharmacy -Telemetry overnight  OSA -Continue BiPAP qhs  Chronic diastolic heart failure Euvolemic.  Hypothyroidism -Continue Synthroid   Cordelia Poche, MD [facility] 04/07/2020, 12:27 PM

## 2020-04-07 NOTE — Progress Notes (Signed)
ANTICOAGULATION CONSULT NOTE - Initial Consult  Pharmacy Consult for Coumadin Indication: atrial fibrillation  Allergies  Allergen Reactions  . Chlorhexidine Hives  . Hydrocodone Nausea And Vomiting    Hydrocodone causes vomiting  . Oxycodone Nausea And Vomiting    Oral oxycodone causes vomiting  . Chlorhexidine Gluconate Rash    Sores at site  . Zoloft [Sertraline] Hives, Swelling and Rash    Patient Measurements:    Vital Signs: Temp: 98.4 F (36.9 C) (08/17 2034) Temp Source: Oral (08/17 2034) BP: 100/58 (08/18 0030) Pulse Rate: 89 (08/18 0030)  Labs: Recent Labs    04/06/20 2047  HGB 15.3*  HCT 46.8*  PLT 259  CREATININE 0.74    CrCl cannot be calculated (Unknown ideal weight.).   Medical History: Past Medical History:  Diagnosis Date  . Abnormality of gait 03/01/2015  . Anxiety   . Arthritis    "knees" (01/19/2016)  . Asthma    as a child   . Back pain   . Cancer ([place])    / RENAL CELL CARCINOMA - GETS CT SCAN EVERY 6 MONTHS TO WATCH   . Chronic pain    "nerve pain from the MS"  . Complex atypical endometrial hyperplasia   . Constipation   . Depression   . DVT (deep venous thrombosis) ([place]) 1983   "RLE; may have just been phlebitis; it was before the age of dopplers"  . Dysrhythmia    afib   . 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 GERD (gastroesophageal reflux disease)   . HLD (hyperlipidemia)   . Hypotension   . Hypothyroidism   . Joint pain   . Multiple sclerosis ([place])    has had this may 1989-[place] reg doc  . OSA on CPAP    bipap machine - SLEEP APNEA WORSE SINCE COVID IN 1/21 PER PATIENT SETTING HAS INCREASED FROM 9 TO 30   . Osteoarthritis   . PAF (paroxysmal atrial fibrillation) ([place])    on coumadin; documented on monitor 06/2010  . Pneumonia 11/2011  . Sleep apnea     Medications:  No current facility-administered medications on file prior to encounter.   Current  Outpatient Medications on File Prior to Encounter  Medication Sig Dispense Refill  . acetaminophen (TYLENOL) 500 MG tablet Take 500-1,000 mg by mouth every 6 (six) hours as needed for fever or headache (or pain).    Marland Kitchen ALPRAZolam (XANAX) 0.5 MG tablet Take 1 tablet (0.5 mg total) by mouth 3 (three) times daily as needed for anxiety or sleep. 90 tablet 0  . baclofen (LIORESAL) 20 MG tablet Once to two tablets a day for breakthrough spasms (Patient taking differently: Take 20 mg by mouth 2 (two) times daily as needed (Pump needs a boot). ) 180 tablet 0  . buPROPion (WELLBUTRIN XL) 300 MG 24 hr tablet Take 1 tablet (300 mg total) by mouth daily. 30 tablet 0  . Calcium Carbonate-Vit D-Min (CALTRATE 600+D PLUS MINERALS) 600-800 MG-UNIT TABS Take 1 tablet by mouth in the morning and at bedtime.     . cetirizine (ZYRTEC) 10 MG tablet Take 10 mg by mouth daily as needed for allergies.     . Cholecalciferol (VITAMIN D) 2000 units tablet Take 1 tablet (2,000 Units total) by mouth 2 (two) times daily.  0  . Coenzyme Q10 (CO Q-10 PO) Take 1 tablet by mouth daily.    . corticotropin (  ACTHAR) 80 UNIT/ML injectable gel Inject 1 mL (80 Units total) into the muscle every other day. For 10 days each month. (Patient taking differently: Inject 80 Units into the muscle See admin instructions. 5 shots a month given at home) 5 mL 5  . Cranberry 1000 MG CAPS Take 2,000 mg by mouth 2 (two) times daily.     Marland Kitchen diltiazem (CARDIZEM CD) 240 MG 24 hr capsule Take 1 capsule (240 mg total) by mouth daily. 90 capsule 3  . diltiazem (CARDIZEM) 60 MG tablet Take 1 tablet (60 mg total) by mouth every 6 (six) hours as needed for up to 7 days (HR sustained >110bpm). 28 tablet 0  . docusate sodium (COLACE) 100 MG capsule Take 1 capsule (100 mg total) by mouth daily. 10 capsule 0  . famotidine (PEPCID) 20 MG tablet Take 20 mg by mouth at bedtime.   6  . fluticasone (FLONASE) 50 MCG/ACT nasal spray Place 2 sprays into both nostrils daily as  needed for allergies.     . furosemide (LASIX) 20 MG tablet Take 1 tablet by mouth daily, may take second dose if needed for excess edema (Patient taking differently: Take 20 mg by mouth daily. may take second dose if needed for excess edema) 180 tablet 3  . gabapentin (NEURONTIN) 300 MG capsule TAKE 1 CAPSULE BY MOUTH UP  TO 4 TIMES DAILY 360 capsule 3  . lactulose, encephalopathy, (GENERLAC) 10 GM/15ML SOLN TAKE 15 ML BY MOUTH TWICE DAILY AS NEEDED 2717 mL 1  . levothyroxine (SYNTHROID, LEVOTHROID) 75 MCG tablet Take 75 mcg by mouth daily.    . melatonin 5 MG TABS Take 5 mg by mouth at bedtime as needed (sleep).     . midodrine (PROAMATINE) 5 MG tablet Take 1 tablet (5 mg total) by mouth 3 (three) times daily with meals. (Patient taking differently: Take 5 mg by mouth 3 (three) times daily as needed (To raise blood pressure). ) 270 tablet 3  . Omega-3 Fatty Acids (FISH OIL PO) Take 1 capsule by mouth daily.    Marland Kitchen omeprazole (PRILOSEC) 20 MG capsule Take 20 mg by mouth daily.     . ondansetron (ZOFRAN) 4 MG tablet Take 4-8 mg by mouth every 8 (eight) hours as needed for nausea or vomiting.     Marland Kitchen oxybutynin (DITROPAN) 5 MG tablet Take 5 mg by mouth 3 (three) times daily.    Marland Kitchen oxyCODONE-acetaminophen (PERCOCET/ROXICET) 5-325 MG tablet Take 1 tablet by mouth every 8 (eight) hours as needed for severe pain. 6 tablet 0  . psyllium (METAMUCIL SMOOTH TEXTURE) 28 % packet Take 1 packet by mouth daily.     . simvastatin (ZOCOR) 10 MG tablet Take 10 mg by mouth every evening.     Marland Kitchen SYRINGE/NEEDLE, DISP, 1 ML (BD LUER-LOK SYRINGE) 25G X 5/8" 1 ML MISC Use as directed to inject  [place]har 10 each prn  . Tamsulosin HCl (FLOMAX) 0.4 MG CAPS Take 0.4 mg by mouth 2 (two) times daily as needed (bladder emptying).     . warfarin (COUMADIN) 5 MG tablet Take 1/2 to 1 tablet by mouth daily as directed by coumadin clinic. (Patient taking differently: Take 5-7.5 mg by mouth See admin instructions. Take 5 mg every day of the  week Except Monday take 7.5 mg) 90 tablet 1     Assessment: 74 y.o. female admitted with UIT, h/o Afib , to continue Coumadin  Goal of Therapy:  INR 2-3 Monitor platelets by anticoagulation protocol: Yes  Plan:  F/U daily INR  Tynslee Bowlds, [place] 04/07/2020,12:50 AM

## 2020-04-08 DIAGNOSIS — G35 Multiple sclerosis: Secondary | ICD-10-CM

## 2020-04-08 DIAGNOSIS — N3000 Acute cystitis without hematuria: Secondary | ICD-10-CM

## 2020-04-08 DIAGNOSIS — I10 Essential (primary) hypertension: Secondary | ICD-10-CM

## 2020-04-08 LAB — PROTIME-INR
INR: 3.6 — ABNORMAL HIGH (ref 0.8–1.2)
Prothrombin Time: 34.6 seconds — ABNORMAL HIGH (ref 11.4–15.2)

## 2020-04-08 MED ORDER — SENNA 8.6 MG PO TABS
1.0000 | ORAL_TABLET | Freq: Every day | ORAL | Status: DC
  Administered 2020-04-08: 8.6 mg via ORAL
  Filled 2020-04-08: qty 1

## 2020-04-08 MED ORDER — CEFDINIR 300 MG PO CAPS: 300.0000 mg | ORAL_CAPSULE | Freq: Two times a day (BID) | ORAL | 0 refills | Status: AC

## 2020-04-08 MED ORDER — FUROSEMIDE 20 MG PO TABS
20.0000 mg | ORAL_TABLET | Freq: Every day | ORAL | Status: DC
  Administered 2020-04-08: 20 mg via ORAL
  Filled 2020-04-08: qty 1

## 2020-04-08 MED ORDER — WARFARIN SODIUM 5 MG PO TABS: 5.0000 mg | ORAL_TABLET | ORAL | Status: DC

## 2020-04-08 MED ORDER — PANTOPRAZOLE SODIUM 40 MG PO TBEC
40.0000 mg | DELAYED_RELEASE_TABLET | Freq: Every day | ORAL | Status: DC
  Administered 2020-04-08: 40 mg via ORAL
  Filled 2020-04-08: qty 1

## 2020-04-08 NOTE — Progress Notes (Signed)
[facility] for warfarin Indication: atrial fibrillation  Allergies  Allergen Reactions  . Chlorhexidine Hives and Other (See Comments)    And, sores appear where applied  . Hydrocodone Nausea And Vomiting and Other (See Comments)    Hydrocodone causes vomiting  . Oxycodone Nausea And Vomiting and Other (See Comments)    Oral oxycodone causes vomiting  . Chlorhexidine Gluconate Rash and Other (See Comments)    Sores at site applied  . Zoloft [Sertraline] Hives, Swelling and Rash    Patient Measurements: Height: 5\' 2"  (157.5 cm) Weight: 81.5 kg (179 lb 10.8 oz) IBW/kg (Calculated) : 50.1  Vital Signs: Temp: 98 F (36.7 C) (08/19 0513) Temp Source: Oral (08/19 0513) BP: 107/53 (08/19 0926) Pulse Rate: 92 (08/19 0926)  Labs: Recent Labs    04/06/20 2047 04/07/20 0342 04/08/20 0415  HGB 15.3* 14.2  --   HCT 46.8* 44.2  --   PLT 259 214  --   LABPROT  --  27.2* 34.6*  INR  --  2.6* 3.6*  CREATININE 0.74 0.62  --     Estimated Creatinine Clearance: 62.9 mL/min (by C-G formula based on SCr of 0.62 mg/dL).   Assessment: 74 y.o. female admitted with abdominal pain and dysuria. Pharmacy consulted to continue warfarin from PTA for history of Afib. INR therapeutic on admit, now up to 3.6.   PTA warfarin dose: 5mg  daily except 7.5mg  on Mondays  Goal of Therapy:  INR 2-3 Monitor platelets by anticoagulation protocol: Yes   Plan:  -Hold warfarin tonight -Daily INR   Arrie Senate, PharmD, BCPS Clinical Pharmacist [phone] Please check AMION for all [place] numbers 04/08/2020

## 2020-04-08 NOTE — Discharge Summary (Signed)
Physician Discharge Summary  Toni [name] DVV:[id number] DOB: Oct 17, 1945 DOA: 04/06/2020  PCP: Prince Solian, MD  Admit date: 04/06/2020 Discharge date: 04/08/2020  Admitted From: Home Disposition: Home  Recommendations for Outpatient Follow-up:  1. Follow up with PCP in 1 week 2. Please follow up on the following pending results: Urine culture  Home Health: PT, OT, RN Equipment/Devices: None  Discharge Condition: Stable CODE STATUS: Full code Diet recommendation: Heart healthy   Brief/Interim Summary:  Admission HPI written by Eben Burow, MD   Chief Complaint: Abdominal pain, dysuria  HPI: Toni [name] is a 74 y.o. female with medical history significant for persistent A. fib, hypertension, hyperlipidemia, DVT, MS, OSA presents for evaluation of acute onset, persistent and progressively worsening abdominal pain and distention. She states that she has experienced abdominal distention intermittently for a few years but over the last several days it has been worse.  She also reports having dysuria and urinary frequency and hesitancy.  States it feels like it has in the past when she had UTI.  She also notes feeling constipated. She last had a bowel movement yesterday which was normal for her, no melena or hematochezia. She typically has 2 or 3 bowel movements daily. She states that she has been treated with 2 courses of Macrobid and a course of Keflex with persistent symptoms which include dysuria, urgency, frequency, cloudy urine and sediment noted in the urine. She tells me that she has felt "out of my mind" and confused which occurs when she gets UTIs. She does feel generally weak.Daughter at the bedside states that the patient used to be on prophylactic antibiotics for frequent UTIs with her old urologist who has since retired. She states that a few weeks ago the patient was started on a course of Macrobid by her urologist,which she completed with persistent  symptoms. She went to her PCP who started her on a course of Macrobid but then called 2 days into the course and switched her to Keflex based on urine culture and sensitivity. She continued to have persistent UTI symptoms so on Thursday theyprovided another urine sample with the PCP. Today they received a phone call from the officerecommending another course of Keflex(initially recommended cipro but this is not compatible with her warfarin).  Unfortunately we do not have a copy of the urine culture results that were called to the patient today.She has had persistent nausea since being diagnosed with Covid inDecember2020. Reports compliance with her home medications including her warfarin and her diltiazem. Denies lightheadedness, shortness of breath, chest pain or leg swelling. She does not typically know when she goes into A. fib with RVR but is in A. fib all the time.    Hospital course:  UTI Outside records obtained and significant for Proteus Mirabilis sensitive to many antibiotics but resistant to Cephalexin. Patient treated empirically with Ceftriaxone which was transitioned to Cefdinir. Urine culture pending at time of discharge. Will treat for a total of 7 days.  Chronic constipation Continue home regimen. Given enema with successful bowel movement.  Permanent afib w/ RVR Restarted on home Cardizem. Rate improved after IV Cardizem in the ED. Stable. Hold Coumadin tonight as an outpatient and resume on 8/20.  OSA Continue BiPAP  Chronic diastolic heart failure Euvolemic.  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   Essential hypertension   Long term current  use of anticoagulant therapy   Obstructive sleep apnea treated with bilevel positive airway pressure (BiPAP)   UTI (urinary tract  infection)    Discharge Instructions  Discharge Instructions    Call MD for:  severe uncontrolled pain   Complete by: As directed    Call MD for:  temperature >100.4   Complete by: As directed      Allergies as of 04/08/2020      Reactions   Chlorhexidine Hives, Other (See Comments)   And, sores appear where applied   Hydrocodone Nausea And Vomiting, Other (See Comments)   Hydrocodone causes vomiting   Oxycodone Nausea And Vomiting, Other (See Comments)   Oral oxycodone causes vomiting   Chlorhexidine Gluconate Rash, Other (See Comments)   Sores at site applied   Zoloft [sertraline] Hives, Swelling, Rash      Medication List    STOP taking these medications   Cranberry 1000 MG Caps   oxyCODONE-acetaminophen 5-325 MG tablet Commonly known as: PERCOCET/ROXICET     TAKE these medications   acetaminophen 500 MG tablet Commonly known as: TYLENOL Take 500-1,000 mg by mouth every 6 (six) hours as needed for fever or headache (or pain).   Acthar 80 UNIT/ML injectable gel Generic drug: corticotropin Inject 1 mL (80 Units total) into the muscle every other day. For 10 days each month. What changed:   how to take this  when to take this  additional instructions   ALPRAZolam 0.5 MG tablet Commonly known as: XANAX Take 1 tablet (0.5 mg total) by mouth 3 (three) times daily as needed for anxiety or sleep.   baclofen 20 MG tablet Commonly known as: LIORESAL Once to two tablets a day for breakthrough spasms What changed:   how much to take  how to take this  when to take this  reasons to take this  additional instructions   BIOTIN PO Take 1 tablet by mouth daily.   buPROPion 300 MG 24 hr tablet Commonly known as: Wellbutrin XL Take 1 tablet (300 mg total) by mouth daily.   CALCIUM + D3 PO Take 2 tablets by mouth in the morning and at bedtime.   Caltrate 600+D Plus Minerals 600-800 MG-UNIT Tabs Take 2 tablets by mouth in the morning and at bedtime.    cefdinir 300 MG capsule Commonly known as: OMNICEF Take 1 capsule (300 mg total) by mouth 2 (two) times daily for 5 days. Start taking on: April 09, 2020   cetirizine 10 MG tablet Commonly known as: ZYRTEC Take 10 mg by mouth daily as needed for allergies.   CO Q-10 PO Take 1 capsule by mouth daily.   diltiazem 240 MG 24 hr capsule Commonly known as: CARDIZEM CD Take 1 capsule (240 mg total) by mouth daily. What changed: when to take this   diltiazem 60 MG tablet Commonly known as: CARDIZEM Take 1 tablet (60 mg total) by mouth every 6 (six) hours as needed for up to 7 days (HR sustained >110bpm).   docusate sodium 100 MG capsule Commonly known as: COLACE Take 1 capsule (100 mg total) by mouth daily. What changed:   how much to take  when to take this   famotidine 20 MG tablet Commonly known as: PEPCID Take 20 mg by mouth at bedtime.   FISH OIL PO Take 1 capsule by mouth daily.   fluticasone 50 MCG/ACT nasal spray Commonly known as: FLONASE Place 2 sprays into both nostrils daily as needed for allergies.   furosemide 20  MG tablet Commonly known as: LASIX Take 1 tablet by mouth daily, may take second dose if needed for excess edema What changed:   how much to take  how to take this  when to take this  additional instructions   gabapentin 300 MG capsule Commonly known as: NEURONTIN TAKE 1 CAPSULE BY MOUTH UP  TO 4 TIMES DAILY What changed: See the new instructions.   lactulose (encephalopathy) 10 GM/15ML Soln Commonly known as: Generlac TAKE 15 ML BY MOUTH TWICE DAILY AS NEEDED What changed:   how much to take  how to take this  when to take this  additional instructions   levothyroxine 75 MCG tablet Commonly known as: SYNTHROID Take 75 mcg by mouth daily before breakfast.   melatonin 5 MG Tabs Take 5 mg by mouth at bedtime as needed (sleep).   midodrine 5 MG tablet Commonly known as: PROAMATINE Take 1 tablet (5 mg total) by mouth 3  (three) times daily with meals. What changed:   when to take this  additional instructions   omeprazole 20 MG capsule Commonly known as: PRILOSEC Take 20 mg by mouth daily before breakfast.   ondansetron 8 MG tablet Commonly known as: ZOFRAN Take 8 mg by mouth See admin instructions. Take 8 mg by mouth in the morning and an additional 8 mg twice a day as needed for nausea What changed: Another medication with the same name was removed. Continue taking this medication, and follow the directions you see here.   oxybutynin 5 MG tablet Commonly known as: DITROPAN Take 5 mg by mouth 3 (three) times daily as needed for bladder spasms.   psyllium 28 % packet Commonly known as: METAMUCIL SMOOTH TEXTURE Take 1 packet by mouth every evening.   simvastatin 10 MG tablet Commonly known as: ZOCOR Take 10 mg by mouth every evening.   SYRINGE/NEEDLE (DISP) 1 ML 25G X 5/8" 1 ML Misc Commonly known as: BD Luer-Lok Syringe Use as directed to inject  Acthar   tamsulosin 0.4 MG Caps capsule Commonly known as: FLOMAX Take 0.4 mg by mouth in the morning.   Vitamin D3 50 MCG (2000 UT) Tabs Take 2,000 Units by mouth in the morning and at bedtime. What changed: Another medication with the same name was removed. Continue taking this medication, and follow the directions you see here.   warfarin 5 MG tablet Commonly known as: COUMADIN Take as directed. If you are unsure how to take this medication, talk to your nurse or doctor. Original instructions: Take 1-1.5 tablets (5-7.5 mg total) by mouth See admin instructions. Take 5 mg by mouth in the late evening on Sun/Tues/Wed/Thurs/Fri/Sat and 7.5 mg on Mondays Start taking on: April 09, 2020       Follow-up Information    Avva, Ravisankar, MD. Schedule an appointment as soon as possible for a visit in 1 week(s).   Specialty: Internal Medicine Why: Hospital follow-up Contact information: [address] [place]  [id number] [phone]              Allergies  Allergen Reactions  . Chlorhexidine Hives and Other (See Comments)    And, sores appear where applied  . Hydrocodone Nausea And Vomiting and Other (See Comments)    Hydrocodone causes vomiting  . Oxycodone Nausea And Vomiting and Other (See Comments)    Oral oxycodone causes vomiting  . Chlorhexidine Gluconate Rash and Other (See Comments)    Sores at site applied  . Zoloft [Sertraline] Hives, Swelling and Rash  Consultations:  None   Procedures/Studies: CT ABDOMEN PELVIS W CONTRAST  Result Date: 04/06/2020 CLINICAL DATA:  Diverticulitis suspected EXAM: CT ABDOMEN AND PELVIS WITH CONTRAST TECHNIQUE: Multidetector CT imaging of the abdomen and pelvis was performed using the standard protocol following bolus administration of intravenous contrast. 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 Calcified granulomata noted in the lower lobes. Stable 3 mm solid nodule in the posterior left lower lobe (5/16). No consolidation or effusion. Hepatobiliary: No worrisome focal liver lesions. Smooth liver surface contour. Normal hepatic attenuation. Gallbladder partially contracted but otherwise unremarkable. No visible calcified gallstones. No pericholecystic inflammation. No biliary ductal dilatation. Pancreas: Unremarkable. No pancreatic ductal dilatation or surrounding inflammatory changes. Spleen: Normal in size. No concerning splenic lesions. Adrenals/Urinary Tract: Normal adrenal glands. Few subcentimeter hypoattenuating foci in the left kidney similar to prior. too small to fully characterize on CT imaging but statistically likely benign. Stable 618 mm heterogeneous enhancing lesion arising from the upper pole left kidney unchanged since  02/25/2019 multiphase exam (6/90). No other concerning renal lesions. Left extrarenal pelvis is noted. Some mild bilateral urothelial thickening is noted. No urolithiasis or hydronephrosis. Extensive circumferential bladder wall thickening with anti dependent focus of gas in the bladder lumen. Stomach/Bowel: Unremarkable distal esophagus. Stomach moderately distended with ingested material. Duodenum is unremarkable with a normal sweep across the midline abdomen. No small bowel thickening or dilatation. A normal appendix is visualized. Large colonic stool burden is noted. Few noninflamed colonic diverticula. No colonic thickening or dilatation. Vascular/Lymphatic: Atherosclerotic calcifications within the abdominal aorta and branch vessels. No aneurysm or ectasia. No enlarged abdominopelvic lymph nodes. Reproductive: Retroverted uterus with normally positioned radiopaque IUD. Possible cystic structure in the right adnexa measuring up to 3.1 cm in size, greater than expected for typical quiescent ovarian tissue in a patient of this age. Left adnexa is unremarkable. Other: Circumferential body wall edema. Neuro stimulator pack in the soft tissues of the right abdominal wall. Leads course to the L5-S1 inter spinous space and into the canal, terminating above the level of imaging. No lead discontinuity or fracture. Musculoskeletal: The osseous structures appear diffusely demineralized which may limit detection of small or nondisplaced fractures. Multilevel degenerative changes are present in the imaged portions of the spine. Vertebral hemangiomata seen T10, L4. Moderate to severe multilevel discogenic and facet degenerative changes maximal L3-L5. Some marked external rotation of the right hip is noted. Possibly positional. Additional degenerative changes in the hips and pelvis. The osseous structures appear diffusely demineralized which may limit detection of small or nondisplaced fractures. IMPRESSION: 1. No evidence of  diverticulitis. Large colonic stool burden, correlate for features of constipation. 2. Extensive circumferential bladder wall thickening with anti dependent focus of gas in the bladder lumen as well as mild urothelial thickening. Findings are concerning for cystitis with ascending urinary tract infection. Correlate with urinalysis. 3. Possible cystic structure in the right adnexa measuring up to 3.1 cm in size, greater than expected for typical quiescent ovarian tissue in a patient of this age. Consider further evaluation with pelvic ultrasound. 4. Unchanged appearance of a heterogeneously enhancing 16 mm lesion in the upper pole left kidney stable since comparison multiphase CT 02/25/2019. 5. 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 6. Aortic Atherosclerosis (ICD10-I70.0). Electronically Signed  By: Lovena Le M.D.   On: 04/06/2020 22:53   XR Shoulder Right  Result Date: 03/29/2020 3 views of the right shoulder show well located shoulder with no acute findings.  There is significant arthritic changes at the [facility] joint and the humeral head is higher within the glenoid fossa suggesting rotator cuff disease that is chronic.  Baclofen Pump Refill  Result Date: 03/11/2020 Kathrynn Ducking, MD     03/11/2020  2:43 PM  History: Ellary Casamento is a 74 year old patient with a history of multiple sclerosis with a paraparesis, she is coming in for a baclofen pump refill today, she has had significant issues following a Covid virus infection, she is still trying to recover from this. Baclofen pump refill note The baclofen pump site was cleaned with Betadine solution. A 21-gauge needle was inserted into the pump port site. Approximately 7 cc of residual baclofen was removed, the pump indicates a 4.4 cc residual. 40 cc of replacement baclofen was placed into the pump at 2000 mcg/cc concentration. The pump was  reprogrammed for the following settings: The pump rate was kept at simple continuous rate of 849.4 micrograms per day The alarm volume is set at 1.5 cc. The next alarm date is June 09, 2020. The patient tolerated the procedure well. There were no complications of the above procedure. The [place] number is [id number] The baclofen expiration date is December 2022. The baclofen lot number is [id number].     Subjective: Mild suprapubic pain. No other issues.  Discharge Exam: Vitals:   04/08/20 0926 04/08/20 1250  BP: (!) 107/53 107/63  Pulse: 92 93  Resp: 14 14  Temp:  98.4 F (36.9 C)  SpO2: 99% 94%   Vitals:   04/08/20 0513 04/08/20 0655 04/08/20 0926 04/08/20 1250  BP: (!) 108/57 (!) 100/59 (!) 107/53 107/63  Pulse: 63 73 92 93  Resp: 15 18 14 14   Temp: 98 F (36.7 C)   98.4 F (36.9 C)  TempSrc: Oral   Oral  SpO2: 98% 98% 99% 94%  Weight: 81.5 kg     Height:        General: Pt is alert, awake, not in acute distress Cardiovascular: RRR, S1/S2 +, no rubs, no gallops Respiratory: CTA bilaterally, no wheezing, no rhonchi Abdominal: Soft, NT, ND, bowel sounds + Extremities: no edema, no cyanosis    The results of significant diagnostics from this hospitalization (including imaging, microbiology, ancillary and laboratory) are listed below for reference.     Microbiology: Recent Results (from the past 240 hour(s))  SARS Coronavirus 2 by RT PCR (hospital order, performed in [facility] hospital lab) Nasopharyngeal Nasopharyngeal Swab     Status: None   Collection Time: 04/07/20 12:46 AM   Specimen: Nasopharyngeal Swab  Result Value Ref Range Status   SARS Coronavirus 2 NEGATIVE NEGATIVE Final    Comment: (NOTE) SARS-CoV-2 target nucleic acids are NOT DETECTED.  The SARS-CoV-2 RNA is generally detectable in upper and lower respiratory specimens during the acute phase of infection. The lowest concentration of SARS-CoV-2 viral copies this assay can detect is 250 copies /  mL. A negative result does not preclude SARS-CoV-2 infection and should not be used as the sole basis for treatment or other patient management decisions.  A negative result may occur with improper specimen collection / handling, submission of specimen other than nasopharyngeal swab, presence of viral mutation(s) within the areas targeted by this assay, and inadequate number of viral copies (<250 copies / mL). A  negative result must be combined with clinical observations, patient history, and epidemiological information.  Fact Sheet for Patients:   StrictlyIdeas.no  Fact Sheet for Healthcare Providers: BankingDealers.co.za  This test is not yet approved or  cleared by the [place] FDA and has been authorized for detection and/or diagnosis of SARS-CoV-2 by FDA under an Emergency Use Authorization (EUA).  This EUA will remain in effect (meaning this test can be used) for the duration of the COVID-19 declaration under Section 564(b)(1) of the Act, 21 U.S.C. section 360bbb-3(b)(1), unless the authorization is terminated or revoked sooner.  Performed at [facility], [place] [address], [place], [place] [address]      Labs: BNP (last 3 results) Recent Labs    08/22/19 1404 10/09/19 1557  BNP 81.8 240.9*   Basic Metabolic Panel: Recent Labs  Lab 04/06/20 2047 04/07/20 0342  NA 138 136  K 4.2 4.3  CL 96* 95*  CO2 31 34*  GLUCOSE 118* 99  BUN 27* 26*  CREATININE 0.74 0.62  CALCIUM 9.3 9.2   Liver Function Tests: Recent Labs  Lab 04/06/20 2047  AST 17  ALT 17  ALKPHOS 55  BILITOT 0.4  PROT 6.4*  ALBUMIN 3.6   Recent Labs  Lab 04/06/20 2047  LIPASE 35   No results for input(s): AMMONIA in the last 168 hours. CBC: Recent Labs  Lab 04/06/20 2047 04/07/20 0342  WBC 9.4 7.6  NEUTROABS 5.1  --   HGB 15.3* 14.2  HCT 46.8* 44.2  MCV 95.7 95.9  PLT 259 214   Cardiac Enzymes: No results for input(s):  CKTOTAL, CKMB, CKMBINDEX, TROPONINI in the last 168 hours. BNP: Invalid input(s): POCBNP CBG: No results for input(s): GLUCAP in the last 168 hours. D-Dimer No results for input(s): DDIMER in the last 72 hours. Hgb A1c No results for input(s): HGBA1C in the last 72 hours. Lipid Profile No results for input(s): CHOL, HDL, LDLCALC, TRIG, CHOLHDL, LDLDIRECT in the last 72 hours. Thyroid function studies No results for input(s): TSH, T4TOTAL, T3FREE, THYROIDAB in the last 72 hours.  Invalid input(s): FREET3 Anemia work up No results for input(s): VITAMINB12, FOLATE, FERRITIN, TIBC, IRON, RETICCTPCT in the last 72 hours. Urinalysis    Component Value Date/Time   COLORURINE AMBER (A) 04/06/2020 2116   APPEARANCEUR HAZY (A) 04/06/2020 2116   APPEARANCEUR Clear 12/18/2014 1156   LABSPEC 1.016 04/06/2020 2116   PHURINE 6.0 04/06/2020 2116   GLUCOSEU NEGATIVE 04/06/2020 2116   HGBUR SMALL (A) 04/06/2020 2116   BILIRUBINUR NEGATIVE 04/06/2020 2116   BILIRUBINUR Negative 12/18/2014 [place] 04/06/2020 2116   PROTEINUR 30 (A) 04/06/2020 2116   UROBILINOGEN 0.2 08/05/2014 0951   NITRITE POSITIVE (A) 04/06/2020 2116   LEUKOCYTESUR LARGE (A) 04/06/2020 2116   Sepsis Labs Invalid input(s): PROCALCITONIN,  WBC,  LACTICIDVEN Microbiology Recent Results (from the past 240 hour(s))  SARS Coronavirus 2 by RT PCR (hospital order, performed in [place] hospital lab) Nasopharyngeal Nasopharyngeal Swab     Status: None   Collection Time: 04/07/20 12:46 AM   Specimen: Nasopharyngeal Swab  Result Value Ref Range Status   SARS Coronavirus 2 NEGATIVE NEGATIVE Final    Comment: (NOTE) SARS-CoV-2 target nucleic acids are NOT DETECTED.  The SARS-CoV-2 RNA is generally detectable in upper and lower respiratory specimens during the acute phase of infection. The lowest concentration of SARS-CoV-2 viral copies this assay can detect is 250 copies / mL. A negative result does not  preclude SARS-CoV-2  infection and should not be used as the sole basis for treatment or other patient management decisions.  A negative result may occur with improper specimen collection / handling, submission of specimen other than nasopharyngeal swab, presence of viral mutation(s) within the areas targeted by this assay, and inadequate number of viral copies (<250 copies / mL). A negative result must be combined with clinical observations, patient history, and epidemiological information.  Fact Sheet for Patients:   StrictlyIdeas.no  Fact Sheet for Healthcare Providers: BankingDealers.co.za  This test is not yet approved or  cleared by the [place] FDA and has been authorized for detection and/or diagnosis of SARS-CoV-2 by FDA under an Emergency Use Authorization (EUA).  This EUA will remain in effect (meaning this test can be used) for the duration of the COVID-19 declaration under Section 564(b)(1) of the Act, 21 U.S.C. section 360bbb-3(b)(1), unless the authorization is terminated or revoked sooner.  Performed at [facility], [place] [address], [place], [place] [address]      Time coordinating discharge: 35 minutes  SIGNED:   Cordelia Poche, MD [facility] 04/08/2020, 3:24 PM

## 2020-04-08 NOTE — Evaluation (Signed)
Occupational Therapy Evaluation Patient Details Name: Toni [name] MRN: [id number] DOB: [birth year] Today's Date: 04/08/2020    History of Present Illness 74 y.o. female with medical history significant for persistent A. fib, hypertension, hyperlipidemia, DVT, MS, OSA, COVID 12/20 presents to ED for acute onset, persistent and progressively worsening abdominal pain and distention. In ED noted to have A fib with RVR. Admitted 04/06/20 for treatment of Afib with RVR and UTI.   Clinical Impression   PTA pt living with spouse and required 24/7 care from home health aides for ADLs and IADLs. At time of eval, pt presents with ability to complete bed mobility at mod A and lateral scoot transfers at max A +2 to recliner. Pt is currently completing UB ADL at min-mod A, LB BADL at max A +2, and functional transfers at max A +2 level. Pt had orthostatic symptoms this session (see BP below) that resolved sitting with legs elevated in recliner. Given current status, recommend pt resume HHOT to support safety, BADL engagement, and PLOF. OT will continue to follow per POC listed below.  BP Supine: 107/71 Sitting in chair after transfer: 93/49 Sitting after 3 mins: 111/64    Follow Up Recommendations  Home health OT;Supervision/Assistance - 24 hour    Equipment Recommendations  None recommended by OT    Recommendations for Other Services       Precautions / Restrictions Precautions Precautions: Fall Precaution Comments: watch BP Restrictions Weight Bearing Restrictions: No      Mobility Bed Mobility Overal bed mobility: Needs Assistance Bed Mobility: Rolling;Supine to Sit;Sit to Supine Rolling: Min assist   Supine to sit: Mod assist Sit to supine: Mod assist   General bed mobility comments: assist to manage RLE translation to EOB as well as assist for trunk to come upright. Cues needed for pt to scoot to EOB with assist from OT at bed pads  Transfers Overall transfer level: Needs  assistance Equipment used: None Transfers: Lateral/Scoot Transfers          Lateral/Scoot Transfers: Max assist;+2 safety/equipment General transfer comment: assist to scoot and clear hips to recliner while second person held recliner steady    Balance Overall balance assessment: Needs assistance Sitting-balance support: Single extremity supported;No upper extremity supported;Feet supported Sitting balance-Leahy Scale: Fair Sitting balance - Comments: cannot tolerate sitting balance challenges     Standing balance-Leahy Scale: Zero                             ADL either performed or assessed with clinical judgement   ADL Overall ADL's : Needs assistance/impaired Eating/Feeding: Set up;Sitting   Grooming: Set up;Sitting   Upper Body Bathing: Moderate assistance;Sitting   Lower Body Bathing: Maximal assistance;Sitting/lateral leans;Sit to/from stand   Upper Body Dressing : Minimal assistance;Sitting   Lower Body Dressing: Maximal assistance;+2 for physical assistance;Bed level Lower Body Dressing Details (indicate cue type and reason): pt brief soiled with urine in bed. Completed rolling to remove brief and place new one with max A +2 Toilet Transfer: Maximal assistance;+2 for safety/equipment Toilet Transfer Details (indicate cue type and reason): simulated to recliner chair. pt performed lateral scoot to drop arm recliner. Required max cues to shift weight and lift and clear hips from bed Toileting- Clothing Manipulation and Hygiene: Total assistance;Bed level Toileting - Clothing Manipulation Details (indicate cue type and reason): total A for peri care while rolling in bed. Pt reports having little control over bowel/bladder  Functional mobility during ADLs: Maximal assistance;+2 for physical assistance (lateral scoot)       Vision Baseline Vision/History: Wears glasses Wears Glasses: At all times Patient Visual Report: No change from baseline        Perception     Praxis      Pertinent Vitals/Pain Pain Assessment: No/denies pain Pain Score: 4  Pain Location: abdomen, bilateral feet pain.  Pain Descriptors / Indicators: Pressure;Squeezing;Tightness;Tingling;Tender;Pins and needles;Burning Pain Intervention(s): Limited activity within patient's tolerance;Monitored during session;Repositioned     Hand Dominance Right   Extremity/Trunk Assessment Upper Extremity Assessment Upper Extremity Assessment: Generalized weakness;RUE deficits/detail RUE Deficits / Details: R UE weaker than L UE  RUE Coordination: decreased fine motor;decreased gross motor   Lower Extremity Assessment Lower Extremity Assessment: Defer to PT evaluation RLE Deficits / Details: hip, knee and ankle lacking full ROM, strength grossly 3/5 RLE Sensation: history of peripheral neuropathy RLE Coordination: decreased fine motor;decreased gross motor LLE Deficits / Details: ROM WFL, strength grossly 3+/5 LLE Sensation: history of peripheral neuropathy LLE Coordination: decreased fine motor   Cervical / Trunk Assessment Cervical / Trunk Assessment: Kyphotic   Communication Communication Communication: No difficulties   Cognition Arousal/Alertness: Awake/alert Behavior During Therapy: WFL for tasks assessed/performed Overall Cognitive Status: Within Functional Limits for tasks assessed                                 General Comments: pt with increased anxiety when disscussing transfer to recliner   General Comments  SpO2 stable on RA this session. Pt slightly orthostatic and mildly symptomatic    Exercises Exercises: General Lower Extremity General Exercises - Lower Extremity Ankle Circles/Pumps: 10 reps;Both;AROM;Seated Long Arc Quad: AROM;Both;10 reps;Seated Hip Flexion/Marching: AROM;Both;10 reps;Seated   Shoulder Instructions      Home Living Family/patient expects to be discharged to:: Private residence Living Arrangements:  Spouse/significant other Available Help at Discharge: Family;Available 24 hours/day;Personal care attendant Type of Home: House Home Access: Ramped entrance     Home Layout: Two level;Able to live on main level with bedroom/bathroom     Bathroom Shower/Tub: Occupational psychologist: Handicapped height Bathroom Accessibility: Yes   Home Equipment: Bedside commode;Shower seat - built in;Grab bars - tub/shower;Grab bars - toilet;Hand held shower head;Hospital bed;Wheelchair - Education administrator (comment)   Additional Comments: Hired assist 7 days a week; sit <> stand lift      Prior Functioning/Environment Level of Independence: Needs assistance  Gait / Transfers Assistance Needed: sliding transfers to w/c, BSC ADL's / Homemaking Assistance Needed: able to dress her upper body, had assist for briefs and pants. Uses purewick and briefs for incontinence management. Aide assists with bathing. Husband/aide assists with IADLs   Comments: able to mobilize with w/c         OT Problem List: Decreased strength;Decreased knowledge of use of DME or AE;Obesity;Decreased activity tolerance;Cardiopulmonary status limiting activity;Impaired balance (sitting and/or standing)      OT Treatment/Interventions: Self-care/ADL training;Therapeutic exercise;Patient/family education;Balance training;Energy conservation;Therapeutic activities;DME and/or AE instruction    OT Goals(Current goals can be found in the care plan section) Acute Rehab OT Goals Patient Stated Goal: return home and continue therapy OT Goal Formulation: With patient Time For Goal Achievement: 04/22/20 Potential to Achieve Goals: Good  OT Frequency: Min 2X/week   Barriers to D/C:            Co-evaluation  AM-PAC OT "6 Clicks" Daily Activity     Outcome Measure Help from another person eating meals?: A Little Help from another person taking care of personal grooming?: A Little Help from another person  toileting, which includes using toliet, bedpan, or urinal?: A Lot Help from another person bathing (including washing, rinsing, drying)?: A Lot Help from another person to put on and taking off regular upper body clothing?: A Little Help from another person to put on and taking off regular lower body clothing?: Total 6 Click Score: 14   End of Session Equipment Utilized During Treatment: Gait belt Nurse Communication: Mobility status;Need for lift equipment  Activity Tolerance: Patient tolerated treatment well Patient left: in chair;with call bell/phone within reach;with family/visitor present  OT Visit Diagnosis: Other abnormalities of gait and mobility (R26.89);Muscle weakness (generalized) (M62.81);Other symptoms and signs involving the nervous system (R29.898)                Time: 2197-5883 OT Time Calculation (min): 40 min Charges:  OT General Charges $OT Visit: 1 Visit OT Evaluation $OT Eval Moderate Complexity: 1 Mod OT Treatments $Self Care/Home Management : 23-37 mins  Zenovia Jarred, MSOT, OTR/L Acute Rehabilitation Services [facility] Office Number: [phone] Pager: [phone]  Zenovia Jarred 04/08/2020, 4:22 PM

## 2020-04-08 NOTE — Progress Notes (Signed)
Patient MEWS fired yellow, due to heart rate. No acute changes. Patient in Afib, heart rate 88-115.  Charge Nurses notified. [place]s vital signs initiated. Will continue to monitor.

## 2020-04-08 NOTE — Progress Notes (Signed)
Pt set up on BIPAP auto titrate for night rest with 3 lpm bled in.  Patient tolerating at this time.

## 2020-04-08 NOTE — Evaluation (Signed)
Physical Therapy Evaluation Patient Details Name: Toni [name] MRN: [id number] DOB: 1945-11-13 Today's Date: 04/08/2020   History of Present Illness  74 y.o. female with medical history significant for persistent A. fib, hypertension, hyperlipidemia, DVT, MS, OSA, COVID 12/20 presents to ED for acute onset, persistent and progressively worsening abdominal pain and distention. In ED noted to have A fib with RVR. Admitted 04/06/20 for treatment of Afib with RVR and UTI.  Clinical Impression  PTA pt living with husband in single story home with ramped entrance. Pt has live in health aide with 24 hr care. Pt reports she has been working with PT and was performing sliding board transfers with set up and min guard assistance. Pt then able to utilize w/c in her home for mobility. Pt requires assist for LE dressing and bathing as well as iADLs. Pt is currently limited in safe mobility by cardiopulmonary response to mobility, pain in bilateral LE and decreased R sided strength. Pt is currently mod A for bed mobility and lateral scooting. Pt has strength and ROM to be able to perform assisted pivot transfer to recliner however HR increases with discussion likely due to anxiety of performing in novel environment. PT recommends return home and resumption of HHPT with [place] to regain her PLOF. Pt will need PTAR transfer home as she is not able to perform car transfers at this time. PT will continue to follow acutely.     Follow Up Recommendations Home health PT;Supervision/Assistance - 24 hour;Other (comment) (will need PTAR transfer home)    Equipment Recommendations  None recommended by PT       Precautions / Restrictions Precautions Precautions: Fall Restrictions Weight Bearing Restrictions: No      Mobility  Bed Mobility Overal bed mobility: Needs Assistance Bed Mobility: Rolling;Supine to Sit;Sit to Supine Rolling: Min assist   Supine to sit: Mod assist Sit to supine: Mod  assist   General bed mobility comments: modA for management of R LE across bed and for bringing trunk to upright, modA for managing R LE back into bed, min A for rolling L and R for straightening pad, pt able to utilze L LE and bed rail with bed in trendelenburg to slide herself to top of bed   Transfers Overall transfer level: Needs assistance Equipment used: None Transfers: Lateral/Scoot Transfers          Lateral/Scoot Transfers: Mod assist General transfer comment: modA for scooting towards HoB utilizing bed pad, pt with increased anxiety about pivot transfer to recliner,        Balance Overall balance assessment: Needs assistance Sitting-balance support: No upper extremity supported;Single extremity supported;Feet supported Sitting balance-Leahy Scale: Poor                                       Pertinent Vitals/Pain Pain Assessment: 0-10 Pain Score: 4  Pain Location: abdomen, bilateral feet pain.  Pain Descriptors / Indicators: Pressure;Squeezing;Tightness;Tingling;Tender;Pins and needles;Burning Pain Intervention(s): Limited activity within patient's tolerance;Monitored during session;Repositioned    Home Living Family/patient expects to be discharged to:: Private residence Living Arrangements: Spouse/significant other Available Help at Discharge: Family;Available 24 hours/day;Personal care attendant Type of Home: House Home Access: Ramped entrance     Home Layout: Two level;Able to live on main level with bedroom/bathroom Home Equipment: Bedside commode;Shower seat - built in;Grab bars - tub/shower;Grab bars - toilet;Hand held shower head;Hospital bed;Wheelchair - Education administrator (comment) (sit  to stand lift ) Additional Comments: Hired assist 7 days a week    Prior Function Level of Independence: Needs assistance   Gait / Transfers Assistance Needed: sliding transfers to w/c, BSC  ADL's / Homemaking Assistance Needed: dressing upper body and assist  for lower body,   Comments: able to mobilize with w/c      Hand Dominance   Dominant Hand: Right    Extremity/Trunk Assessment   Upper Extremity Assessment Upper Extremity Assessment: Generalized weakness;RUE deficits/detail RUE Deficits / Details: R UE weaker than L UE     Lower Extremity Assessment Lower Extremity Assessment: RLE deficits/detail;LLE deficits/detail RLE Deficits / Details: hip, knee and ankle lacking full ROM, strength grossly 3/5 RLE Sensation: history of peripheral neuropathy RLE Coordination: decreased fine motor;decreased gross motor LLE Deficits / Details: ROM WFL, strength grossly 3+/5 LLE Sensation: history of peripheral neuropathy LLE Coordination: decreased fine motor    Cervical / Trunk Assessment Cervical / Trunk Assessment: Kyphotic  Communication   Communication: No difficulties  Cognition Arousal/Alertness: Awake/alert Behavior During Therapy: WFL for tasks assessed/performed;Anxious Overall Cognitive Status: Within Functional Limits for tasks assessed                                 General Comments: pt with increased anxiety when disscussing transfer to recliner      General Comments General comments (skin integrity, edema, etc.): Pt on 2L O2 via [place] on entry, with SaO2 98%O2, pt reports she only uses supplemental O2 at night for sleeping, removed [place] and pt able to maintain SaO2 at >90%O2 throughout session, HR increased to 144 bpm with coming to seated, BP in sittng  113/63    Exercises General Exercises - Lower Extremity Ankle Circles/Pumps: 10 reps;Both;AROM;Seated Long Arc Quad: AROM;Both;10 reps;Seated Hip Flexion/Marching: AROM;Both;10 reps;Seated   Assessment/Plan    PT Assessment Patient needs continued PT services  PT Problem List Decreased strength;Decreased range of motion;Decreased activity tolerance;Decreased balance;Decreased mobility;Decreased cognition;Decreased coordination;Cardiopulmonary status  limiting activity;Impaired sensation;Pain       PT Treatment Interventions DME instruction;Gait training;Functional mobility training;Therapeutic activities;Therapeutic exercise;Cognitive remediation;Patient/family education;Balance training    PT Goals (Current goals can be found in the Care Plan section)  Acute Rehab PT Goals Patient Stated Goal: go home and get back to PT PT Goal Formulation: With patient Time For Goal Achievement: 04/22/20 Potential to Achieve Goals: Fair    Frequency Min 3X/week    AM-PAC PT "6 Clicks" Mobility  Outcome Measure Help needed turning from your back to your side while in a flat bed without using bedrails?: None Help needed moving from lying on your back to sitting on the side of a flat bed without using bedrails?: A Lot Help needed moving to and from a bed to a chair (including a wheelchair)?: Total Help needed standing up from a chair using your arms (e.g., wheelchair or bedside chair)?: Total Help needed to walk in hospital room?: Total Help needed climbing 3-5 steps with a railing? : Total 6 Click Score: 10    End of Session Equipment Utilized During Treatment: Gait belt Activity Tolerance: Patient tolerated treatment well Patient left: in bed;with call bell/phone within reach;with bed alarm set Nurse Communication: Mobility status PT Visit Diagnosis: Unsteadiness on feet (R26.81);Other abnormalities of gait and mobility (R26.89);Muscle weakness (generalized) (M62.81);Difficulty in walking, not elsewhere classified (R26.2);Pain;Other symptoms and signs involving the nervous system (R29.898) Pain - Right/Left:  (bilateral ) Pain - part of  body: Ankle and joints of foot (abdomen)    Time: 1025-1100 PT Time Calculation (min) (ACUTE ONLY): 35 min   Charges:   PT Evaluation $PT Eval Moderate Complexity: 1 Mod PT Treatments $Therapeutic Activity: 8-22 mins        Anaysha B. Migdalia Dk PT, DPT Acute Rehabilitation Services Pager [phone] Office [phone]   [place] 04/08/2020, 2:02 PM

## 2020-04-09 ENCOUNTER — Telehealth: Payer: Self-pay | Admitting: Pharmacist Clinician (PhC)/ Clinical Pharmacy Specialist

## 2020-04-09 DIAGNOSIS — G4731 Primary central sleep apnea: Secondary | ICD-10-CM | POA: Diagnosis not present

## 2020-04-09 DIAGNOSIS — I110 Hypertensive heart disease with heart failure: Secondary | ICD-10-CM | POA: Diagnosis not present

## 2020-04-09 DIAGNOSIS — I429 Cardiomyopathy, unspecified: Secondary | ICD-10-CM | POA: Diagnosis not present

## 2020-04-09 DIAGNOSIS — E039 Hypothyroidism, unspecified: Secondary | ICD-10-CM | POA: Diagnosis not present

## 2020-04-09 DIAGNOSIS — E785 Hyperlipidemia, unspecified: Secondary | ICD-10-CM | POA: Diagnosis not present

## 2020-04-09 DIAGNOSIS — G35 Multiple sclerosis: Secondary | ICD-10-CM | POA: Diagnosis not present

## 2020-04-09 DIAGNOSIS — R1111 Vomiting without nausea: Secondary | ICD-10-CM | POA: Diagnosis not present

## 2020-04-09 DIAGNOSIS — I872 Venous insufficiency (chronic) (peripheral): Secondary | ICD-10-CM | POA: Diagnosis not present

## 2020-04-09 DIAGNOSIS — G933 Postviral fatigue syndrome: Secondary | ICD-10-CM | POA: Diagnosis not present

## 2020-04-09 DIAGNOSIS — R32 Unspecified urinary incontinence: Secondary | ICD-10-CM | POA: Diagnosis not present

## 2020-04-09 DIAGNOSIS — C541 Malignant neoplasm of endometrium: Secondary | ICD-10-CM | POA: Diagnosis not present

## 2020-04-09 DIAGNOSIS — B964 Proteus (mirabilis) (morganii) as the cause of diseases classified elsewhere: Secondary | ICD-10-CM | POA: Diagnosis not present

## 2020-04-09 DIAGNOSIS — I5032 Chronic diastolic (congestive) heart failure: Secondary | ICD-10-CM | POA: Diagnosis not present

## 2020-04-09 DIAGNOSIS — E240 Pituitary-dependent Cushing's disease: Secondary | ICD-10-CM | POA: Diagnosis not present

## 2020-04-09 DIAGNOSIS — K219 Gastro-esophageal reflux disease without esophagitis: Secondary | ICD-10-CM | POA: Diagnosis not present

## 2020-04-09 DIAGNOSIS — M170 Bilateral primary osteoarthritis of knee: Secondary | ICD-10-CM | POA: Diagnosis not present

## 2020-04-09 DIAGNOSIS — C649 Malignant neoplasm of unspecified kidney, except renal pelvis: Secondary | ICD-10-CM | POA: Diagnosis not present

## 2020-04-09 DIAGNOSIS — N319 Neuromuscular dysfunction of bladder, unspecified: Secondary | ICD-10-CM | POA: Diagnosis not present

## 2020-04-09 DIAGNOSIS — I83893 Varicose veins of bilateral lower extremities with other complications: Secondary | ICD-10-CM | POA: Diagnosis not present

## 2020-04-09 DIAGNOSIS — B948 Sequelae of other specified infectious and parasitic diseases: Secondary | ICD-10-CM | POA: Diagnosis not present

## 2020-04-09 DIAGNOSIS — I4821 Permanent atrial fibrillation: Secondary | ICD-10-CM | POA: Diagnosis not present

## 2020-04-09 DIAGNOSIS — G4709 Other insomnia: Secondary | ICD-10-CM | POA: Diagnosis not present

## 2020-04-09 DIAGNOSIS — N390 Urinary tract infection, site not specified: Secondary | ICD-10-CM | POA: Diagnosis not present

## 2020-04-09 DIAGNOSIS — K5904 Chronic idiopathic constipation: Secondary | ICD-10-CM | POA: Diagnosis not present

## 2020-04-09 DIAGNOSIS — G8929 Other chronic pain: Secondary | ICD-10-CM | POA: Diagnosis not present

## 2020-04-09 NOTE — Telephone Encounter (Signed)
Spoke with patient.  She was hospitalized earlier this week, d/c yesterday.  AF with RVR and recurrent UTI.  Given Cefdinir IV in hospital x 2 days then switched to PO 300 mg bid x 5 days at discharge.  INR at d/c yesterday was 3.6, warfarin held yesterday.  HH is coming out today to re-certify patient for PT, OT and RN.   Asked patient to have [place] contact our office today for orders to draw INR Monday Aug 23.  Pt to take warfarin 5 mg today, then 2.5 mg Saturday and Sunday.

## 2020-04-10 LAB — URINE CULTURE: Culture: >=100000 — AB

## 2020-04-12 ENCOUNTER — Ambulatory Visit (INDEPENDENT_AMBULATORY_CARE_PROVIDER_SITE_OTHER): Payer: Medicare Other | Admitting: Cardiology

## 2020-04-12 DIAGNOSIS — B964 Proteus (mirabilis) (morganii) as the cause of diseases classified elsewhere: Secondary | ICD-10-CM | POA: Diagnosis not present

## 2020-04-12 DIAGNOSIS — R1111 Vomiting without nausea: Secondary | ICD-10-CM | POA: Diagnosis not present

## 2020-04-12 DIAGNOSIS — Z7901 Long term (current) use of anticoagulants: Secondary | ICD-10-CM

## 2020-04-12 DIAGNOSIS — G35 Multiple sclerosis: Secondary | ICD-10-CM | POA: Diagnosis not present

## 2020-04-12 DIAGNOSIS — N390 Urinary tract infection, site not specified: Secondary | ICD-10-CM | POA: Diagnosis not present

## 2020-04-12 DIAGNOSIS — M170 Bilateral primary osteoarthritis of knee: Secondary | ICD-10-CM | POA: Diagnosis not present

## 2020-04-12 DIAGNOSIS — G8929 Other chronic pain: Secondary | ICD-10-CM | POA: Diagnosis not present

## 2020-04-12 LAB — POCT INR: INR: 1.7 — AB (ref 2.0–3.0)

## 2020-04-12 NOTE — Patient Instructions (Signed)
Description   Take 10mg  today, then continue warfarin 5mg  daily except for 7.5mg  every Mondays. Repeat INR in 1 week. *Dose and Orders given [place] while at home with patient*

## 2020-04-13 DIAGNOSIS — M170 Bilateral primary osteoarthritis of knee: Secondary | ICD-10-CM | POA: Diagnosis not present

## 2020-04-13 DIAGNOSIS — G8929 Other chronic pain: Secondary | ICD-10-CM | POA: Diagnosis not present

## 2020-04-13 DIAGNOSIS — N390 Urinary tract infection, site not specified: Secondary | ICD-10-CM | POA: Diagnosis not present

## 2020-04-13 DIAGNOSIS — G35 Multiple sclerosis: Secondary | ICD-10-CM | POA: Diagnosis not present

## 2020-04-13 DIAGNOSIS — B964 Proteus (mirabilis) (morganii) as the cause of diseases classified elsewhere: Secondary | ICD-10-CM | POA: Diagnosis not present

## 2020-04-13 DIAGNOSIS — R1111 Vomiting without nausea: Secondary | ICD-10-CM | POA: Diagnosis not present

## 2020-04-15 ENCOUNTER — Telehealth: Payer: Self-pay | Admitting: Neurology

## 2020-04-15 ENCOUNTER — Encounter: Payer: Self-pay | Admitting: *Deleted

## 2020-04-15 MED ORDER — BACLOFEN 20 MG PO TABS
20.0000 mg | ORAL_TABLET | Freq: Two times a day (BID) | ORAL | 0 refills | Status: DC | PRN
Start: 2020-04-15 — End: 2020-07-17

## 2020-04-15 NOTE — Addendum Note (Signed)
Addended by: Minna Antis on: 04/15/2020 10:52 AM   Modules accepted: Orders

## 2020-04-15 NOTE — Telephone Encounter (Signed)
Pt is requesting a refill for baclofen (LIORESAL) 20 MG tablet.  Pharmacy: [facility]

## 2020-04-15 NOTE — Telephone Encounter (Signed)
Refill sent as requested. Sent her my chart to advise.

## 2020-04-16 DIAGNOSIS — G35 Multiple sclerosis: Secondary | ICD-10-CM | POA: Diagnosis not present

## 2020-04-16 DIAGNOSIS — N390 Urinary tract infection, site not specified: Secondary | ICD-10-CM | POA: Diagnosis not present

## 2020-04-16 DIAGNOSIS — G8929 Other chronic pain: Secondary | ICD-10-CM | POA: Diagnosis not present

## 2020-04-16 DIAGNOSIS — B964 Proteus (mirabilis) (morganii) as the cause of diseases classified elsewhere: Secondary | ICD-10-CM | POA: Diagnosis not present

## 2020-04-16 DIAGNOSIS — M170 Bilateral primary osteoarthritis of knee: Secondary | ICD-10-CM | POA: Diagnosis not present

## 2020-04-16 DIAGNOSIS — R1111 Vomiting without nausea: Secondary | ICD-10-CM | POA: Diagnosis not present

## 2020-04-19 ENCOUNTER — Telehealth: Payer: Self-pay | Admitting: Gastroenterology

## 2020-04-19 ENCOUNTER — Telehealth: Payer: Self-pay

## 2020-04-19 DIAGNOSIS — R1111 Vomiting without nausea: Secondary | ICD-10-CM | POA: Diagnosis not present

## 2020-04-19 DIAGNOSIS — G8929 Other chronic pain: Secondary | ICD-10-CM | POA: Diagnosis not present

## 2020-04-19 DIAGNOSIS — B964 Proteus (mirabilis) (morganii) as the cause of diseases classified elsewhere: Secondary | ICD-10-CM | POA: Diagnosis not present

## 2020-04-19 DIAGNOSIS — M170 Bilateral primary osteoarthritis of knee: Secondary | ICD-10-CM | POA: Diagnosis not present

## 2020-04-19 DIAGNOSIS — N390 Urinary tract infection, site not specified: Secondary | ICD-10-CM | POA: Diagnosis not present

## 2020-04-19 DIAGNOSIS — G35 Multiple sclerosis: Secondary | ICD-10-CM | POA: Diagnosis not present

## 2020-04-19 NOTE — Telephone Encounter (Signed)
Pt was recently in the hospital. Reports she was having pain on the left side of her abdomen along with bloating. Pt had CT done that showed no diverticulitis and large stool burden. She states they took care of the constipation while in the hospital. Pt wanted Dr. Tarri Glenn to be aware of her being hospitalized and see if she had any other recommendations based on CT scan. Reports she is still having some discomfort. Pt has OV scheduled for follow-up with Dr. Tarri Glenn. Please advise.

## 2020-04-19 NOTE — Telephone Encounter (Signed)
Voicemail, 3:27p:  Schedule next appointment with Dr. [name].

## 2020-04-19 NOTE — Telephone Encounter (Signed)
Please confirm that she is still taking the regimen as outlined during her last office visit. I see discrepancies between her discharge medication list and what I had previously recommended for her chronic constipation. Thank you.

## 2020-04-19 NOTE — Telephone Encounter (Signed)
Pt ask to schedule an appt with [facility]. Schedule appt

## 2020-04-19 NOTE — Telephone Encounter (Signed)
Pt is requesting a call back from a nurse regarding her recent hospital admission, pt would like to discuss the records with the nurse.

## 2020-04-20 ENCOUNTER — Ambulatory Visit (INDEPENDENT_AMBULATORY_CARE_PROVIDER_SITE_OTHER): Payer: Medicare Other | Admitting: Internal Medicine

## 2020-04-20 DIAGNOSIS — Z7901 Long term (current) use of anticoagulants: Secondary | ICD-10-CM

## 2020-04-20 DIAGNOSIS — N3946 Mixed incontinence: Secondary | ICD-10-CM | POA: Diagnosis not present

## 2020-04-20 DIAGNOSIS — N3000 Acute cystitis without hematuria: Secondary | ICD-10-CM | POA: Diagnosis not present

## 2020-04-20 LAB — POCT INR: INR: 2.5 (ref 2.0–3.0)

## 2020-04-20 MED ORDER — LINACLOTIDE 145 MCG PO CAPS: 145.0000 ug | ORAL_CAPSULE | Freq: Every day | ORAL | 3 refills | Status: DC

## 2020-04-20 NOTE — Telephone Encounter (Signed)
I increased her Metamucil QD, continued her lactulose BID, frequent stool softener at night and started Linzess 145 mcg daily.   Spoke with pt and reviewed above recommendations. Pt has not been using the linzess, she will start this again. Refill sent to pharmacy.

## 2020-04-21 DIAGNOSIS — R1111 Vomiting without nausea: Secondary | ICD-10-CM | POA: Diagnosis not present

## 2020-04-21 DIAGNOSIS — B964 Proteus (mirabilis) (morganii) as the cause of diseases classified elsewhere: Secondary | ICD-10-CM | POA: Diagnosis not present

## 2020-04-21 DIAGNOSIS — G35 Multiple sclerosis: Secondary | ICD-10-CM | POA: Diagnosis not present

## 2020-04-21 DIAGNOSIS — N390 Urinary tract infection, site not specified: Secondary | ICD-10-CM | POA: Diagnosis not present

## 2020-04-21 DIAGNOSIS — M170 Bilateral primary osteoarthritis of knee: Secondary | ICD-10-CM | POA: Diagnosis not present

## 2020-04-21 DIAGNOSIS — G8929 Other chronic pain: Secondary | ICD-10-CM | POA: Diagnosis not present

## 2020-04-22 DIAGNOSIS — R1111 Vomiting without nausea: Secondary | ICD-10-CM | POA: Diagnosis not present

## 2020-04-22 DIAGNOSIS — G8929 Other chronic pain: Secondary | ICD-10-CM | POA: Diagnosis not present

## 2020-04-22 DIAGNOSIS — M170 Bilateral primary osteoarthritis of knee: Secondary | ICD-10-CM | POA: Diagnosis not present

## 2020-04-22 DIAGNOSIS — B964 Proteus (mirabilis) (morganii) as the cause of diseases classified elsewhere: Secondary | ICD-10-CM | POA: Diagnosis not present

## 2020-04-22 DIAGNOSIS — G35 Multiple sclerosis: Secondary | ICD-10-CM | POA: Diagnosis not present

## 2020-04-22 DIAGNOSIS — N390 Urinary tract infection, site not specified: Secondary | ICD-10-CM | POA: Diagnosis not present

## 2020-04-27 ENCOUNTER — Telehealth: Payer: Self-pay

## 2020-04-27 DIAGNOSIS — R1111 Vomiting without nausea: Secondary | ICD-10-CM | POA: Diagnosis not present

## 2020-04-27 DIAGNOSIS — M170 Bilateral primary osteoarthritis of knee: Secondary | ICD-10-CM | POA: Diagnosis not present

## 2020-04-27 DIAGNOSIS — G35 Multiple sclerosis: Secondary | ICD-10-CM | POA: Diagnosis not present

## 2020-04-27 DIAGNOSIS — B964 Proteus (mirabilis) (morganii) as the cause of diseases classified elsewhere: Secondary | ICD-10-CM | POA: Diagnosis not present

## 2020-04-27 DIAGNOSIS — G8929 Other chronic pain: Secondary | ICD-10-CM | POA: Diagnosis not present

## 2020-04-27 DIAGNOSIS — N390 Urinary tract infection, site not specified: Secondary | ICD-10-CM | POA: Diagnosis not present

## 2020-04-27 NOTE — Telephone Encounter (Signed)
I called and spoke w/ nurse stated that since the pt has an office visit w/provider that we could just draw the inr in the office this week. The nurse voiced understanding.

## 2020-04-27 NOTE — Telephone Encounter (Signed)
Patient called to report she was prescribed Bactrim DS for ongoing UTI.  Will start with first dose this am.  Advised that she take only 2.5 mg warfarin on Wednesday (instead of 5 mg)

## 2020-04-28 DIAGNOSIS — M170 Bilateral primary osteoarthritis of knee: Secondary | ICD-10-CM | POA: Diagnosis not present

## 2020-04-28 DIAGNOSIS — B964 Proteus (mirabilis) (morganii) as the cause of diseases classified elsewhere: Secondary | ICD-10-CM | POA: Diagnosis not present

## 2020-04-28 DIAGNOSIS — N390 Urinary tract infection, site not specified: Secondary | ICD-10-CM | POA: Diagnosis not present

## 2020-04-28 DIAGNOSIS — G8929 Other chronic pain: Secondary | ICD-10-CM | POA: Diagnosis not present

## 2020-04-28 DIAGNOSIS — R1111 Vomiting without nausea: Secondary | ICD-10-CM | POA: Diagnosis not present

## 2020-04-28 DIAGNOSIS — G35 Multiple sclerosis: Secondary | ICD-10-CM | POA: Diagnosis not present

## 2020-04-29 ENCOUNTER — Ambulatory Visit (INDEPENDENT_AMBULATORY_CARE_PROVIDER_SITE_OTHER): Payer: Medicare Other | Admitting: Internal Medicine

## 2020-04-29 ENCOUNTER — Other Ambulatory Visit: Payer: Self-pay

## 2020-04-29 ENCOUNTER — Ambulatory Visit (INDEPENDENT_AMBULATORY_CARE_PROVIDER_SITE_OTHER): Payer: Medicare Other

## 2020-04-29 ENCOUNTER — Encounter: Payer: Self-pay | Admitting: Internal Medicine

## 2020-04-29 VITALS — BP 109/69 | HR 107 | Temp 97.3°F | Ht 67.0 in

## 2020-04-29 DIAGNOSIS — Z7901 Long term (current) use of anticoagulants: Secondary | ICD-10-CM

## 2020-04-29 DIAGNOSIS — I951 Orthostatic hypotension: Secondary | ICD-10-CM

## 2020-04-29 DIAGNOSIS — I5032 Chronic diastolic (congestive) heart failure: Secondary | ICD-10-CM

## 2020-04-29 DIAGNOSIS — I4821 Permanent atrial fibrillation: Secondary | ICD-10-CM | POA: Diagnosis not present

## 2020-04-29 LAB — POCT INR: INR: 4.6 — AB (ref 2.0–3.0)

## 2020-04-29 NOTE — Progress Notes (Signed)
OFFICE NOTE  Chief Complaint:  Follow-up A. fib  Primary Care Physician: Prince Solian, MD  HPI:  Toni [name] is a 74 year old female with multiple sclerosis, paroxysmal atrial fibrillation, on Coumadin, morbid obesity, obstructive sleep apnea, and dyslipidemia. She has had worsening swelling in her legs, thought to be due to an MS flare. She has a history of varicose veins with severe venous insufficiency in both the right and left greater saphenous veins. The right greater saphenous vein was actually successfully ablated by my partner, Dr. Elisabeth Cara, about 1 to 2 years ago. She does have residual reflux in the left greater saphenous vein extending from the proximal calf up to the saphenofemoral junction. The vein measured between 4 and 8 mm with between 1 and 6 seconds of reflux. We had discussed ablation of that before, but she has canceled several times at procedures. At this point, I think there is little benefit to continuing to try for her to undergo ablation. 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 She can wear work showed a compression stockings and this does seem to help her. With regards to her atrial fibrillation she has not had significant recurrence to her knowledge. She continues on warfarin and has been therapeutic, with her INR of 1.9 in the office by fingerstick today.  [name] returns today for followup. She reports her MS is somewhat progressive. She continues to have some enlargement of her legs but no significant swelling. There is a mild varicosities in the right lower extremity. She is unaware of her atrial fibrillation. INR today was 2.3.   I saw [name] back in the office today,  She is describing more shortness of breath and feels that she's been in A. Fib more recently. In fact today6/21her EKG does show persistent atrial fibrillation. In the past she's been paroxysmal , but it may be that she is more frequently  having A. Fib. She has trouble telling whether not she is more short of breath or symptomatic from A. Fib versus her multiple sclerosis. She is also reporting more swelling in her left leg.  [name] returns for follow-up today. She recently saw her neurologist regarding her MS which seems to be flaring. In addition she was noted to have significant apneic events despite using CPAP. There is concern for possible central sleep apnea. Both of these episodes are probably contributing to her recurrent atrial fibrillation. She's been wearing a monitor for a period of time which indicates a burden of A. fib of about 80% of the time. It does not appear that she is in persistent A. fib, rather she has had some episodes of sinus rhythm, but they're much less frequent than her A. Fib.  I saw [name] back today in the office. Recently I saw her as an add-on visit briefly as she was describing some chest pain when she was getting her INR checked. An EKG at that time showed no ischemia. I did not feel that this was cardiac. She continues intermittently have abnormal pains both in her chest as well as her abdomen and extremities. This may be related to MS. She's had difficulty with CPAP and apparently getting a good mask fit. She has been a persistent atrial fibrillation for about 6 months. It's unclear whether or not she symptomatic with this and therefore I have not pursued a rhythm control strategy. Her rate control is generally pretty good and she has been therapeutic  on warfarin. She is reporting worsening swelling of her legs although her weight she says is been stable. She has a history of venous insufficiency and had a right greater saphenous vein ablation by one of my partners. She's concerned about more swelling in her left leg and whether she's developed worsening venous insufficiency. She also reports cold feet with numbness in her extremities. As she is basically wheelchair bound, I cannot assess whether she  is having any claudication.  [name] returns today for follow-up. She underwent venous and arterial lower external Dopplers. Arterial Dopplers were normal which showed no evidence of obstruction. Her venous Doppler showed no DVT. There was significant venous reflux in the left greater saphenous vein. This is an area where she has swelling and discomfort. This may be amenable to treatment. I increased her Lasix and she reports some improvement in her leg swelling. She's trying to elevate her feet and reports she is intolerant of wearing compression stockings.  02/03/2016  [name] was seen today in the office for follow-up of recent hospitalization. She was seeing her neurologist and complained of left chest and left arm pain and was referred to the hospital. She ruled out for MI and was felt that her chest pain was on coronary. She underwent an echocardiogram which showed low-normal EF but was also found to be in A. fib with rapid ventricular response. Her A. fib is persistent at this time but the rate was elevated. She was started on Cardizem which helped improve her rate and was discharged on Cardizem CD 120 mg daily. Heart rate today is better controlled in the 70s. She reports her arm pain is improved and she said today that she "did not feel that it was her heart".  05/09/2016  [name] was seen in the office for follow-up today. She reports improved heart rate control with heart rates between 50 and 80 on diltiazem. Over this will help with her mild cardiomyopathy with EF 50-55% on recent echo. She reports some increased fluid gain particularly in her abdomen. This may be due to some degree of right heart dysfunction. She's working with her neurologist for MS as well as her sleep apnea. There is a component of central sleep apnea and she is reportedly going to have additional sleep study to look for the need for BiPAP therapy. She denies any recurrent chest pain or left arm  pain.  02/14/2017  [name] was seen today in follow-up. She was recently hospitalized. She said problems with increasing fluid gain. I increased her Lasix last time however when she was hospitalized she required high-dose Lasix and says she has she felt better with that. She subsequently been put on BiPAP therapy. She says her sleeping is improved significantly. She is concerned though recently she's had some increased weight gain. She feels like she may need an increasing dose of diuretic. Her last echo was in December 2017 which showed an EF of 55%. This is been stable. If this is some heart failure, may be related diastolic dysfunction however suspect is more likely related to autonomic dysfunction the setting of multiple sclerosis.  03/29/2017  [name] returns today for follow-up. She continues to report lower extremity edema and abdominal swelling. We'll increase her Lasix however she has not noted significant weight loss. It is actually very difficult to estimate her weight because of her MS. We tried to place her on the scale today however she was unable to stand long enough to get an  accurate weight. She reports urination was not as good on 40 mg of Lasix but is currently taking 80 mg once daily and has better urination with that.  05/18/2017  [name] was seen today in follow-up. She reports some improvement in her abdominal swelling and bloating. Check she feels like she might be somewhat dry now. Her weight is down to 246 pounds from 253 pounds. She is also working with the weight loss clinic at [facility]. She is interested in decreasing her diuretic which is reasonable. She asked again about probably trying to get back into sinus rhythm however she's been in persistent A. fib which is long-standing at this point for more than one or 2 years. Her last echo in December 2017 showed mild left atrial enlargement. Is not clear that she's symptomatic with her A. fib although does feel tired at  times. She says she's had some improvement with her sleep apnea therapy.  01/15/2018  [name] returns today for follow-up.  She is continued to lose some weight.  Weight is down now to 218 pounds, significant improvement from her prior weight over 250 pounds.  She also had marked reduction in edema.  Her blood pressure is low normal at the moment and may benefit from reduction in her blood pressure medications.  Her sleep apnea has improved significantly according to Dr. Brett Fairy, especially her obstructive symptoms.  She still has central sleep apnea.  In addition she has secondary progressive MS and recently had worsening symptoms associated with that.  Heart rate was elevated today with regards to A. fib however that did come down after short period of time.  She again appears to be asymptomatic with her A. fib.  The main reason not to pursue sinus rhythm is the fact that she has been persistently in A. fib for well over 5 years, and the likelihood of reestablishing sinus rhythm is exceedingly low.  05/16/2018  [name] is seen today in follow-up.  Her weight is down even further today to 204.  She has had recently some positional dizziness and low blood pressures.  This is likely due to the significant weight loss and I would recommend a decrease in her medications.  She says that she is breathing better.  Her sleep apnea has improved significantly according to her sleep doctor and she has having less symptoms related to her MS.  She is in permanent A. fib with controlled ventricular response.  03/10/2019  [name] returns today for follow-up.  She is done very well overall.  She is continued to lose weight with Dr. Migdalia Dk work.  Weight now 186 pounds down from 210 when I last saw her.  This is amazing since she is essentially sedentary due to her MS.  She reports reasonable control of her A. fib.  She has had some decrease in her PA P requirements with regards to sleep apnea.  Blood pressure is  well controlled.  She has recently had some swelling of the lower extremities and her abdomen.  She wonders if this could be some heart failure.  I suspect the lower extremity swelling is most likely due to venous insufficiency.  I previously performed a venous ablation on the right lower extremity and she still has some venous insufficiency of the left lower extremity however in the common femoral vein not amenable to ablation.  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   01/28/2020  [name] is seen today in follow-up.  She continues to lose some weight related to her diet by Dr. Leafy Ro.  She today is in A. fib with RVR.  Blood pressure has run low recently.  She has not needed to use midodrine that frequently.  She has follow-up with Dr. Brett Fairy in July.  I recently had corresponded with her about findings that Ms. Pepitone was hypoxic during the day as well as at night.  There are plans to both give her oxygen via BiPAP as well as possible oxygen during the day.  She has been referred to see Dr. Hermina Staggers at [facility] pulmonary.  04/29/2020  [name] seen today for follow-up of A. fib.  Is been 3 months since I last saw her.  In the meantime she has been set up with bleed oxygen into her BiPAP.  I increased her diltiazem which is resulted in some improvement in heart rate although there is still a variability related to her A. fib.  Blood pressure is still reasonable in the low 673 systolic.  She has not required taking additional midodrine.  She feels like she is getting some abdominal bloating/swelling and has started to take some Lasix as needed for this.  She was taken off of this due to orthostasis.  PMHx:  Past Medical History:  Diagnosis Date  . Abnormality of gait 03/01/2015  . Anxiety   . Arthritis    "knees" (01/19/2016)  . Asthma    as a child   . Back pain   . Cancer ([place])    / RENAL CELL CARCINOMA - GETS CT SCAN EVERY 6 MONTHS TO WATCH   . Chronic pain    "nerve pain  from the MS"  . Complex atypical endometrial hyperplasia   . Constipation   . Depression   . DVT (deep venous thrombosis) ([place]) 1983   "RLE; may have just been phlebitis; it was before the age of dopplers"  . Dysrhythmia    afib   . 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 GERD (gastroesophageal reflux disease)   . HLD (hyperlipidemia)   . Hypotension   . Hypothyroidism   . Joint pain   . Multiple sclerosis ([place])    has had this may 1989-[facility] reg doc  . OSA on CPAP    bipap machine - SLEEP APNEA WORSE SINCE COVID IN 1/21 PER PATIENT SETTING HAS INCREASED FROM 9 TO 30   . Osteoarthritis   . PAF (paroxysmal atrial fibrillation) ([place])    on coumadin; documented on monitor 06/2010  . Pneumonia 11/2011  . Sleep apnea     Past Surgical History:  Procedure Laterality Date  . ANTERIOR CERVICAL DECOMP/DISCECTOMY FUSION  01/2004  . CARDIAC CATHETERIZATION  06/22/2010   normal coronary arteries, PAF  . CATARACT EXTRACTION, BILATERAL Bilateral   . CHILD BIRTHS     X2  . DILATATION & CURETTAGE/HYSTEROSCOPY WITH MYOSURE N/A 01/15/2020   Procedure: DILATATION & CURETTAGE/HYSTEROSCOPY WITH MYOSURE;  Surgeon: Arvella Nigh, MD;  Location: [facility];  Service: Gynecology;  Laterality: N/A;  pt in wheelchair-has MS  . DILATION AND CURETTAGE OF UTERUS    . DILATION AND CURETTAGE OF UTERUS N/A 02/16/2020   Procedure: DILATATION AND CURETTAGE;  Surgeon: Lafonda Mosses, MD;  Location: [facility];  Service: Gynecology;  Laterality: N/A;  NOT GENERAL -NO INTUBATION  . INFUSION PUMP IMPLANTATION  ~ 2009   baclofen infusion in lower abd  . INTRAUTERINE  DEVICE (IUD) INSERTION N/A 02/16/2020   Procedure: INTRAUTERINE DEVICE (IUD) INSERTION - MIRENA;  Surgeon: Lafonda Mosses, MD;  Location: [facility];  Service: Gynecology;  Laterality: N/A;  . PAIN PUMP REVISION N/A 07/29/2014   Procedure: Baclofen pump replacement;  Surgeon: Erline Levine, MD;  Location: [place]  [facility];  Service: Neurosurgery;  Laterality: N/A;  Baclofen pump replacement  . TONSILLECTOMY AND ADENOIDECTOMY    . VARICOSE VEIN SURGERY  ~ 1968  . VENOUS ABLATION  12/16/2010   radiofreq ablation -Dr Elisabeth Cara and [facility]  . WISDOM TOOTH EXTRACTION      FAMHx:  Family History  Problem Relation Age of Onset  . Coronary artery disease Father        at age 73  . Hyperlipidemia Father   . Thyroid disease Father   . Coronary artery disease Maternal Grandmother   . Depression Maternal Grandmother   . Cancer Paternal Grandmother        breast  . Depression Mother   . Sudden death Mother   . Anxiety disorder Mother   . Alcoholism Son     SOCHx:   reports that she quit smoking about 43 years ago. Her smoking use included cigarettes. She has a 3.50 pack-year smoking history. She has never used smokeless tobacco. She reports previous alcohol use. She reports that she does not use drugs.  ALLERGIES:  Allergies  Allergen Reactions  . Chlorhexidine Hives and Other (See Comments)    And, sores appear where applied  . Hydrocodone Nausea And Vomiting and Other (See Comments)    Hydrocodone causes vomiting  . Oxycodone Nausea And Vomiting and Other (See Comments)    Oral oxycodone causes vomiting  . Chlorhexidine Gluconate Rash and Other (See Comments)    Sores at site applied  . Zoloft [Sertraline] Hives, Swelling and Rash    ROS: Pertinent items noted in HPI and remainder of comprehensive ROS otherwise negative.  HOME MEDS: Current Outpatient Medications  Medication Sig Dispense Refill  . acetaminophen (TYLENOL) 500 MG tablet Take 500-1,000 mg by mouth every 6 (six) hours as needed for fever or headache (or pain).    Marland Kitchen ALPRAZolam (XANAX) 0.5 MG tablet Take 1 tablet (0.5 mg total) by mouth 3 (three) times daily as needed for anxiety or sleep. 90 tablet 0  . baclofen (LIORESAL) 20 MG tablet Take 1 tablet (20 mg total) by mouth 2 (two) times daily as needed (if the pump needs a  boost). 180 tablet 0  . BIOTIN PO Take 1 tablet by mouth daily.    Marland Kitchen buPROPion (WELLBUTRIN XL) 300 MG 24 hr tablet Take 1 tablet (300 mg total) by mouth daily. 30 tablet 0  . Calcium Carb-Cholecalciferol (CALCIUM + D3 PO) Take 2 tablets by mouth in the morning and at bedtime.    . Calcium Carbonate-Vit D-Min (CALTRATE 600+D PLUS MINERALS) 600-800 MG-UNIT TABS Take 2 tablets by mouth in the morning and at bedtime.     . cetirizine (ZYRTEC) 10 MG tablet Take 10 mg by mouth daily as needed for allergies.     . Cholecalciferol (VITAMIN D3) 50 MCG (2000 UT) TABS Take 2,000 Units by mouth in the morning and at bedtime.    . Coenzyme Q10 (CO Q-10 PO) Take 1 capsule by mouth daily.     . corticotropin (ACTHAR) 80 UNIT/ML injectable gel Inject 1 mL (80 Units total) into the muscle every other day. For 10 days each month. (Patient taking differently: Inject 80 Units into the  skin See admin instructions. Inject into the skin as directed every other day for a period of 10 days each month) 5 mL 5  . diltiazem (CARDIZEM CD) 240 MG 24 hr capsule Take 1 capsule (240 mg total) by mouth daily. (Patient taking differently: Take 240 mg by mouth in the morning. ) 90 capsule 3  . docusate sodium (COLACE) 100 MG capsule Take 1 capsule (100 mg total) by mouth daily. (Patient taking differently: Take 200 mg by mouth at bedtime. ) 10 capsule 0  . famotidine (PEPCID) 20 MG tablet Take 20 mg by mouth at bedtime.   6  . fluticasone (FLONASE) 50 MCG/ACT nasal spray Place 2 sprays into both nostrils daily as needed for allergies.     . furosemide (LASIX) 20 MG tablet Take 1 tablet by mouth daily, may take second dose if needed for excess edema (Patient taking differently: Take 20 mg by mouth in the morning and at bedtime. ) 180 tablet 3  . gabapentin (NEURONTIN) 300 MG capsule TAKE 1 CAPSULE BY MOUTH UP  TO 4 TIMES DAILY (Patient taking differently: Take 300 mg by mouth every 6 (six) hours. ) 360 capsule 3  . lactulose,  encephalopathy, (GENERLAC) 10 GM/15ML SOLN TAKE 15 ML BY MOUTH TWICE DAILY AS NEEDED (Patient taking differently: Take 10 g by mouth See admin instructions. Take 10 grams by mouth once a day and an additional 10 grams once daily, as needed to move the bowels) 2717 mL 1  . levothyroxine (SYNTHROID, LEVOTHROID) 75 MCG tablet Take 75 mcg by mouth daily before breakfast.     . linaclotide (LINZESS) 145 MCG CAPS capsule Take 1 capsule (145 mcg total) by mouth daily before breakfast. 90 capsule 3  . melatonin 5 MG TABS Take 5 mg by mouth at bedtime as needed (sleep).     . midodrine (PROAMATINE) 5 MG tablet Take 1 tablet (5 mg total) by mouth 3 (three) times daily with meals. (Patient taking differently: Take 5 mg by mouth See admin instructions. Take 5 mg by mouth up to three times a day with food, as needed to raise the blood pressure) 270 tablet 3  . Omega-3 Fatty Acids (FISH OIL PO) Take 1 capsule by mouth daily.    Marland Kitchen omeprazole (PRILOSEC) 20 MG capsule Take 20 mg by mouth daily before breakfast.     . ondansetron (ZOFRAN) 8 MG tablet Take 8 mg by mouth See admin instructions. Take 8 mg by mouth in the morning and an additional 8 mg twice a day as needed for nausea    . oxybutynin (DITROPAN) 5 MG tablet Take 5 mg by mouth 3 (three) times daily as needed for bladder spasms.     Marland Kitchen psyllium (METAMUCIL SMOOTH TEXTURE) 28 % packet Take 1 packet by mouth every evening.     . simvastatin (ZOCOR) 10 MG tablet Take 10 mg by mouth every evening.     . sulfamethoxazole-trimethoprim (BACTRIM) 200-40 MG/5ML suspension Take 150 mg/m2/day by mouth 2 (two) times daily.    . SYRINGE/NEEDLE, DISP, 1 ML (BD LUER-LOK SYRINGE) 25G X 5/8" 1 ML MISC Use as directed to inject  Acthar 10 each prn  . Tamsulosin HCl (FLOMAX) 0.4 MG CAPS Take 0.4 mg by mouth in the morning.     . warfarin (COUMADIN) 5 MG tablet Take 1-1.5 tablets (5-7.5 mg total) by mouth See admin instructions. Take 5 mg by mouth in the late evening on  Sun/Tues/Wed/Thurs/Fri/Sat and 7.5 mg on Mondays    .  diltiazem (CARDIZEM) 60 MG tablet Take 1 tablet (60 mg total) by mouth every 6 (six) hours as needed for up to 7 days (HR sustained >110bpm). 28 tablet 0   No current facility-administered medications for this visit.    LABS/IMAGING: No results found. However, due to the size of the patient record, not all encounters were searched. Please check Results Review for a complete set of results. No results found.  VITALS: BP 109/69   Pulse (!) 107   Temp (!) 97.3 F (36.3 C)   Ht 5\' 7"  (1.702 m)   SpO2 99%   BMI 28.14 kg/m   EXAM: General appearance: alert, no distress and moderately obese Neck: no carotid bruit, no JVD and thyroid not enlarged, symmetric, no tenderness/mass/nodules Lungs: clear to auscultation bilaterally Heart: irregularly irregular rhythm Abdomen: soft, non-tender; bowel sounds normal; no masses,  no organomegaly Extremities: edema Trace lower extremity Pulses: 2+ and symmetric Skin: Skin color, texture, turgor normal. No rashes or lesions Neurologic: Mental status: Alert, oriented, thought content appropriate Psych: Pleasant  EKG: A. fib with PVCs at 107-personally reviewed  ASSESSMENT: 1. Permanent atrial fibrillation - 80% frequency of A. Fib 2. Morbid obesity 3. Obstructive sleep apnea on BIPAP 4. Multiple sclerosis 5. Chronic anticoagulation on warfarin - INR 2.3 today 6. Chronic venous insufficiency - +LGSV reflux 7. Hypertension-controlled 8. Noncardiac chest and left arm pain 9. Non-ischemic cardiomyopathy - EF 50%, improved to 55% (07/2016) 10. Acute on chronic diastolic congestive heart failure  PLAN: 1.   [name] has had some improvement in heart rates with an increase in her Cardizem.  I do not think there is room to increase it further due to blood pressure.  Fortunately she has not required the midodrine.  She is now on BiPAP with oxygen (3 L).  Otherwise I think she is fairly stable.   She does have diastolic congestive heart failure and will likely require Lasix.  Recently she started taking some as needed and if blood pressure tolerates she may need to take between 20 and 40 mg on a daily basis.  Follow-up with me in 6 months or sooner as necessary.  Pixie Casino, MD, [facility], [place] Director of the Advanced Lipid Disorders &  Cardiovascular Risk Reduction Clinic Diplomate of the American Board of Clinical Lipidology Attending Cardiologist  Direct Dial: [phone]  Fax: [phone]  Website:  [name] Marcena Dias 04/29/2020, 2:17 PM

## 2020-04-29 NOTE — Patient Instructions (Signed)
Hold today and tomorrow and then continue warfarin 5mg  daily except for 7.5mg  every Mondays. Repeat INR in 2 weeks. *Dose and Orders given Sarah RN and patient*

## 2020-04-29 NOTE — Patient Instructions (Signed)
Medication Instructions:  No changes  *If you need a refill on your cardiac medications before your next appointment, please call your pharmacy*   Lab Work: Not needed If you have labs (blood work) drawn today and your tests are completely normal, you will receive your results only by: Marland Kitchen MyChart Message (if you have MyChart) OR . A paper copy in the mail If you have any lab test that is abnormal or we need to change your treatment, we will call you to review the results.   Testing/Procedures: Not needed   Follow-Up: At [facility], you and your health needs are our priority.  As part of our continuing mission to provide you with exceptional heart care, we have created designated Provider Care Teams.  These Care Teams include your primary Cardiologist (physician) and Advanced Practice Providers (APPs -  Physician Assistants and Nurse Practitioners) who all work together to provide you with the care you need, when you need it.    Your next appointment:   6 month(s)  The format for your next appointment:   In Person  Provider:   K. Mali Hilty, MD

## 2020-04-30 DIAGNOSIS — B964 Proteus (mirabilis) (morganii) as the cause of diseases classified elsewhere: Secondary | ICD-10-CM | POA: Diagnosis not present

## 2020-04-30 DIAGNOSIS — R1111 Vomiting without nausea: Secondary | ICD-10-CM | POA: Diagnosis not present

## 2020-04-30 DIAGNOSIS — G35 Multiple sclerosis: Secondary | ICD-10-CM | POA: Diagnosis not present

## 2020-04-30 DIAGNOSIS — G8929 Other chronic pain: Secondary | ICD-10-CM | POA: Diagnosis not present

## 2020-04-30 DIAGNOSIS — N390 Urinary tract infection, site not specified: Secondary | ICD-10-CM | POA: Diagnosis not present

## 2020-04-30 DIAGNOSIS — M170 Bilateral primary osteoarthritis of knee: Secondary | ICD-10-CM | POA: Diagnosis not present

## 2020-05-03 DIAGNOSIS — G8929 Other chronic pain: Secondary | ICD-10-CM | POA: Diagnosis not present

## 2020-05-03 DIAGNOSIS — N390 Urinary tract infection, site not specified: Secondary | ICD-10-CM | POA: Diagnosis not present

## 2020-05-03 DIAGNOSIS — M170 Bilateral primary osteoarthritis of knee: Secondary | ICD-10-CM | POA: Diagnosis not present

## 2020-05-03 DIAGNOSIS — R1111 Vomiting without nausea: Secondary | ICD-10-CM | POA: Diagnosis not present

## 2020-05-03 DIAGNOSIS — B964 Proteus (mirabilis) (morganii) as the cause of diseases classified elsewhere: Secondary | ICD-10-CM | POA: Diagnosis not present

## 2020-05-03 DIAGNOSIS — G35 Multiple sclerosis: Secondary | ICD-10-CM | POA: Diagnosis not present

## 2020-05-04 ENCOUNTER — Telehealth: Payer: Self-pay

## 2020-05-04 DIAGNOSIS — N390 Urinary tract infection, site not specified: Secondary | ICD-10-CM | POA: Diagnosis not present

## 2020-05-04 DIAGNOSIS — G35 Multiple sclerosis: Secondary | ICD-10-CM | POA: Diagnosis not present

## 2020-05-04 DIAGNOSIS — G8929 Other chronic pain: Secondary | ICD-10-CM | POA: Diagnosis not present

## 2020-05-04 DIAGNOSIS — M170 Bilateral primary osteoarthritis of knee: Secondary | ICD-10-CM | POA: Diagnosis not present

## 2020-05-04 DIAGNOSIS — R1111 Vomiting without nausea: Secondary | ICD-10-CM | POA: Diagnosis not present

## 2020-05-04 DIAGNOSIS — B964 Proteus (mirabilis) (morganii) as the cause of diseases classified elsewhere: Secondary | ICD-10-CM | POA: Diagnosis not present

## 2020-05-04 NOTE — Telephone Encounter (Signed)
Pt called in to say they will be discharged from home health and we scheduled an in office coumadin check

## 2020-05-06 DIAGNOSIS — F418 Other specified anxiety disorders: Secondary | ICD-10-CM | POA: Diagnosis not present

## 2020-05-06 DIAGNOSIS — N840 Polyp of corpus uteri: Secondary | ICD-10-CM | POA: Diagnosis not present

## 2020-05-06 DIAGNOSIS — I10 Essential (primary) hypertension: Secondary | ICD-10-CM | POA: Diagnosis not present

## 2020-05-06 DIAGNOSIS — Z23 Encounter for immunization: Secondary | ICD-10-CM | POA: Diagnosis not present

## 2020-05-06 DIAGNOSIS — I519 Heart disease, unspecified: Secondary | ICD-10-CM | POA: Diagnosis not present

## 2020-05-06 DIAGNOSIS — G35 Multiple sclerosis: Secondary | ICD-10-CM | POA: Diagnosis not present

## 2020-05-06 DIAGNOSIS — B079 Viral wart, unspecified: Secondary | ICD-10-CM | POA: Diagnosis not present

## 2020-05-06 DIAGNOSIS — I872 Venous insufficiency (chronic) (peripheral): Secondary | ICD-10-CM | POA: Diagnosis not present

## 2020-05-06 DIAGNOSIS — N3000 Acute cystitis without hematuria: Secondary | ICD-10-CM | POA: Diagnosis not present

## 2020-05-06 DIAGNOSIS — I480 Paroxysmal atrial fibrillation: Secondary | ICD-10-CM | POA: Diagnosis not present

## 2020-05-06 DIAGNOSIS — E669 Obesity, unspecified: Secondary | ICD-10-CM | POA: Diagnosis not present

## 2020-05-07 DIAGNOSIS — G8929 Other chronic pain: Secondary | ICD-10-CM | POA: Diagnosis not present

## 2020-05-07 DIAGNOSIS — B964 Proteus (mirabilis) (morganii) as the cause of diseases classified elsewhere: Secondary | ICD-10-CM | POA: Diagnosis not present

## 2020-05-07 DIAGNOSIS — G35 Multiple sclerosis: Secondary | ICD-10-CM | POA: Diagnosis not present

## 2020-05-07 DIAGNOSIS — R1111 Vomiting without nausea: Secondary | ICD-10-CM | POA: Diagnosis not present

## 2020-05-07 DIAGNOSIS — M170 Bilateral primary osteoarthritis of knee: Secondary | ICD-10-CM | POA: Diagnosis not present

## 2020-05-07 DIAGNOSIS — N390 Urinary tract infection, site not specified: Secondary | ICD-10-CM | POA: Diagnosis not present

## 2020-05-09 DIAGNOSIS — G8929 Other chronic pain: Secondary | ICD-10-CM | POA: Diagnosis not present

## 2020-05-09 DIAGNOSIS — E785 Hyperlipidemia, unspecified: Secondary | ICD-10-CM | POA: Diagnosis not present

## 2020-05-09 DIAGNOSIS — E039 Hypothyroidism, unspecified: Secondary | ICD-10-CM | POA: Diagnosis not present

## 2020-05-09 DIAGNOSIS — I5032 Chronic diastolic (congestive) heart failure: Secondary | ICD-10-CM | POA: Diagnosis not present

## 2020-05-09 DIAGNOSIS — I83893 Varicose veins of bilateral lower extremities with other complications: Secondary | ICD-10-CM | POA: Diagnosis not present

## 2020-05-09 DIAGNOSIS — E240 Pituitary-dependent Cushing's disease: Secondary | ICD-10-CM | POA: Diagnosis not present

## 2020-05-09 DIAGNOSIS — N390 Urinary tract infection, site not specified: Secondary | ICD-10-CM | POA: Diagnosis not present

## 2020-05-09 DIAGNOSIS — C541 Malignant neoplasm of endometrium: Secondary | ICD-10-CM | POA: Diagnosis not present

## 2020-05-09 DIAGNOSIS — B964 Proteus (mirabilis) (morganii) as the cause of diseases classified elsewhere: Secondary | ICD-10-CM | POA: Diagnosis not present

## 2020-05-09 DIAGNOSIS — R32 Unspecified urinary incontinence: Secondary | ICD-10-CM | POA: Diagnosis not present

## 2020-05-09 DIAGNOSIS — K219 Gastro-esophageal reflux disease without esophagitis: Secondary | ICD-10-CM | POA: Diagnosis not present

## 2020-05-09 DIAGNOSIS — I872 Venous insufficiency (chronic) (peripheral): Secondary | ICD-10-CM | POA: Diagnosis not present

## 2020-05-09 DIAGNOSIS — C649 Malignant neoplasm of unspecified kidney, except renal pelvis: Secondary | ICD-10-CM | POA: Diagnosis not present

## 2020-05-09 DIAGNOSIS — I110 Hypertensive heart disease with heart failure: Secondary | ICD-10-CM | POA: Diagnosis not present

## 2020-05-09 DIAGNOSIS — N319 Neuromuscular dysfunction of bladder, unspecified: Secondary | ICD-10-CM | POA: Diagnosis not present

## 2020-05-09 DIAGNOSIS — R1111 Vomiting without nausea: Secondary | ICD-10-CM | POA: Diagnosis not present

## 2020-05-09 DIAGNOSIS — G933 Postviral fatigue syndrome: Secondary | ICD-10-CM | POA: Diagnosis not present

## 2020-05-09 DIAGNOSIS — B948 Sequelae of other specified infectious and parasitic diseases: Secondary | ICD-10-CM | POA: Diagnosis not present

## 2020-05-09 DIAGNOSIS — K5904 Chronic idiopathic constipation: Secondary | ICD-10-CM | POA: Diagnosis not present

## 2020-05-09 DIAGNOSIS — G4731 Primary central sleep apnea: Secondary | ICD-10-CM | POA: Diagnosis not present

## 2020-05-09 DIAGNOSIS — G4709 Other insomnia: Secondary | ICD-10-CM | POA: Diagnosis not present

## 2020-05-09 DIAGNOSIS — I429 Cardiomyopathy, unspecified: Secondary | ICD-10-CM | POA: Diagnosis not present

## 2020-05-09 DIAGNOSIS — G35 Multiple sclerosis: Secondary | ICD-10-CM | POA: Diagnosis not present

## 2020-05-09 DIAGNOSIS — M170 Bilateral primary osteoarthritis of knee: Secondary | ICD-10-CM | POA: Diagnosis not present

## 2020-05-09 DIAGNOSIS — I4821 Permanent atrial fibrillation: Secondary | ICD-10-CM | POA: Diagnosis not present

## 2020-05-10 ENCOUNTER — Telehealth: Payer: Self-pay

## 2020-05-10 DIAGNOSIS — R1111 Vomiting without nausea: Secondary | ICD-10-CM | POA: Diagnosis not present

## 2020-05-10 DIAGNOSIS — M170 Bilateral primary osteoarthritis of knee: Secondary | ICD-10-CM | POA: Diagnosis not present

## 2020-05-10 DIAGNOSIS — G35 Multiple sclerosis: Secondary | ICD-10-CM | POA: Diagnosis not present

## 2020-05-10 DIAGNOSIS — B964 Proteus (mirabilis) (morganii) as the cause of diseases classified elsewhere: Secondary | ICD-10-CM | POA: Diagnosis not present

## 2020-05-10 DIAGNOSIS — N390 Urinary tract infection, site not specified: Secondary | ICD-10-CM | POA: Diagnosis not present

## 2020-05-10 DIAGNOSIS — G8929 Other chronic pain: Secondary | ICD-10-CM | POA: Diagnosis not present

## 2020-05-10 NOTE — Telephone Encounter (Signed)
Per note by Dr. Berline Lopes on 02-18-20 regarding pathology report: Great news! The sample from your procedure Monday showed no cancer or precancer - that leads me to believe it may have been limited to the polyp. Hopefully the IUD will treat any precancerous cells that may still be in the uterus. I will see you in several months for a clinic visit - please don't hesitate to call if you have any issues prior.

## 2020-05-10 NOTE — Telephone Encounter (Signed)
Pt call about her appointment with Dr. Berline Lopes on Friday 05-14-20. She is asking if it could be a virtual/phone appointment as her MS has flared up and is not up to coming into the office.

## 2020-05-11 NOTE — Telephone Encounter (Signed)
Toni [name] is not experiencing  any vaginal bleeding. Dr. Berline Lopes would like to do an endometrial bx in the next 1-2 months. Pt agreeable to r/s in office visit to 06-08-20 at 3 pm with a 2:30 arrival.

## 2020-05-14 ENCOUNTER — Ambulatory Visit (INDEPENDENT_AMBULATORY_CARE_PROVIDER_SITE_OTHER): Payer: Medicare Other

## 2020-05-14 ENCOUNTER — Ambulatory Visit: Payer: Medicare Other | Admitting: Gynecologic Oncology

## 2020-05-14 ENCOUNTER — Other Ambulatory Visit: Payer: Self-pay

## 2020-05-14 DIAGNOSIS — R1111 Vomiting without nausea: Secondary | ICD-10-CM | POA: Diagnosis not present

## 2020-05-14 DIAGNOSIS — B964 Proteus (mirabilis) (morganii) as the cause of diseases classified elsewhere: Secondary | ICD-10-CM | POA: Diagnosis not present

## 2020-05-14 DIAGNOSIS — G8929 Other chronic pain: Secondary | ICD-10-CM | POA: Diagnosis not present

## 2020-05-14 DIAGNOSIS — I4821 Permanent atrial fibrillation: Secondary | ICD-10-CM

## 2020-05-14 DIAGNOSIS — Z7901 Long term (current) use of anticoagulants: Secondary | ICD-10-CM

## 2020-05-14 DIAGNOSIS — M170 Bilateral primary osteoarthritis of knee: Secondary | ICD-10-CM | POA: Diagnosis not present

## 2020-05-14 DIAGNOSIS — N390 Urinary tract infection, site not specified: Secondary | ICD-10-CM | POA: Diagnosis not present

## 2020-05-14 DIAGNOSIS — G35 Multiple sclerosis: Secondary | ICD-10-CM | POA: Diagnosis not present

## 2020-05-14 LAB — POCT INR: INR: 4.3 — AB (ref 2.0–3.0)

## 2020-05-14 NOTE — Patient Instructions (Signed)
Hold today and tomorrow and then continue warfarin 5mg  daily. Repeat INR in 2 weeks. *Dose and Orders given Sarah RN and patient*

## 2020-05-17 DIAGNOSIS — R1111 Vomiting without nausea: Secondary | ICD-10-CM | POA: Diagnosis not present

## 2020-05-17 DIAGNOSIS — N390 Urinary tract infection, site not specified: Secondary | ICD-10-CM | POA: Diagnosis not present

## 2020-05-17 DIAGNOSIS — G35 Multiple sclerosis: Secondary | ICD-10-CM | POA: Diagnosis not present

## 2020-05-17 DIAGNOSIS — G8929 Other chronic pain: Secondary | ICD-10-CM | POA: Diagnosis not present

## 2020-05-17 DIAGNOSIS — M170 Bilateral primary osteoarthritis of knee: Secondary | ICD-10-CM | POA: Diagnosis not present

## 2020-05-17 DIAGNOSIS — B964 Proteus (mirabilis) (morganii) as the cause of diseases classified elsewhere: Secondary | ICD-10-CM | POA: Diagnosis not present

## 2020-05-19 DIAGNOSIS — G8929 Other chronic pain: Secondary | ICD-10-CM | POA: Diagnosis not present

## 2020-05-19 DIAGNOSIS — G35 Multiple sclerosis: Secondary | ICD-10-CM | POA: Diagnosis not present

## 2020-05-19 DIAGNOSIS — N390 Urinary tract infection, site not specified: Secondary | ICD-10-CM | POA: Diagnosis not present

## 2020-05-19 DIAGNOSIS — R1111 Vomiting without nausea: Secondary | ICD-10-CM | POA: Diagnosis not present

## 2020-05-19 DIAGNOSIS — M170 Bilateral primary osteoarthritis of knee: Secondary | ICD-10-CM | POA: Diagnosis not present

## 2020-05-19 DIAGNOSIS — B964 Proteus (mirabilis) (morganii) as the cause of diseases classified elsewhere: Secondary | ICD-10-CM | POA: Diagnosis not present

## 2020-05-20 DIAGNOSIS — C44519 Basal cell carcinoma of skin of other part of trunk: Secondary | ICD-10-CM | POA: Diagnosis not present

## 2020-05-20 DIAGNOSIS — D485 Neoplasm of uncertain behavior of skin: Secondary | ICD-10-CM | POA: Diagnosis not present

## 2020-05-24 DIAGNOSIS — N390 Urinary tract infection, site not specified: Secondary | ICD-10-CM | POA: Diagnosis not present

## 2020-05-24 DIAGNOSIS — R1111 Vomiting without nausea: Secondary | ICD-10-CM | POA: Diagnosis not present

## 2020-05-24 DIAGNOSIS — M170 Bilateral primary osteoarthritis of knee: Secondary | ICD-10-CM | POA: Diagnosis not present

## 2020-05-24 DIAGNOSIS — G35 Multiple sclerosis: Secondary | ICD-10-CM | POA: Diagnosis not present

## 2020-05-24 DIAGNOSIS — B964 Proteus (mirabilis) (morganii) as the cause of diseases classified elsewhere: Secondary | ICD-10-CM | POA: Diagnosis not present

## 2020-05-24 DIAGNOSIS — G8929 Other chronic pain: Secondary | ICD-10-CM | POA: Diagnosis not present

## 2020-05-26 ENCOUNTER — Telehealth: Payer: Self-pay | Admitting: Pulmonary Disease

## 2020-05-26 DIAGNOSIS — G4733 Obstructive sleep apnea (adult) (pediatric): Secondary | ICD-10-CM

## 2020-05-26 DIAGNOSIS — N390 Urinary tract infection, site not specified: Secondary | ICD-10-CM | POA: Diagnosis not present

## 2020-05-26 DIAGNOSIS — G8929 Other chronic pain: Secondary | ICD-10-CM | POA: Diagnosis not present

## 2020-05-26 DIAGNOSIS — M170 Bilateral primary osteoarthritis of knee: Secondary | ICD-10-CM | POA: Diagnosis not present

## 2020-05-26 DIAGNOSIS — B964 Proteus (mirabilis) (morganii) as the cause of diseases classified elsewhere: Secondary | ICD-10-CM | POA: Diagnosis not present

## 2020-05-26 DIAGNOSIS — R1111 Vomiting without nausea: Secondary | ICD-10-CM | POA: Diagnosis not present

## 2020-05-26 DIAGNOSIS — G35 Multiple sclerosis: Secondary | ICD-10-CM | POA: Diagnosis not present

## 2020-05-26 NOTE — Telephone Encounter (Signed)
Called and spoke with patient, she was told by [employer] that her insurance company needed for her to be requalified for her oxygen that she wears with her bipap, she just had an ono 3 months ago.  I let her know I would call [employer] and find out what is needed and then we would give her a call back.  Called [employer] and spoke with Abigail Butts, she reviewed the notes and was told she was new to [facility] and needs re qualification.  Transferred to [place] who transferred me to Seth Bake who states that she needs an office visit which can be a televisit and needs an ONO within 30 days of each other.  The office notes need to state how the patient benefits from the oxygen therapy.    Called the patient and explained that she needs an office visit (televisit) and an ONO within 30 days of each other to get her qualified per Medicare guidelines.  She verbalized understanding.  I explained I will contact Dr. Ander Slade to get the order and then we will schedule the f/u televisit.  She verbalized understanding.  Dr. Ander Slade, Can we have an order for an ONO to get patient requalified for her oxygen at night with her bipap per Medicare standards?  Patient has MS and wheelchair bound, is it ok to do a telelvisit for the f/u visit?  Please advise.  Thank you.

## 2020-05-27 NOTE — Telephone Encounter (Signed)
Spoke with pt and advised that ONO order has been placed. Advised pt that a day or two after she has ONO she needs to call out office to make sure we get ONO results and schedule a televisit with Dr Ander Slade within 30 days. Pt verbalized understanding. Nothing further needed.

## 2020-05-27 NOTE — Telephone Encounter (Signed)
Yes okay for televisit.  Does it have to be with me or can it be with an APP as well?  Does not matter from my end, will be glad to do the follow up.

## 2020-05-31 ENCOUNTER — Telehealth: Payer: Self-pay | Admitting: Pulmonary Disease

## 2020-05-31 NOTE — Telephone Encounter (Signed)
error 

## 2020-06-01 ENCOUNTER — Telehealth: Payer: Self-pay | Admitting: *Deleted

## 2020-06-01 NOTE — Telephone Encounter (Signed)
Called and moved the patient's appt from 10/19 to 11/4

## 2020-06-02 DIAGNOSIS — M170 Bilateral primary osteoarthritis of knee: Secondary | ICD-10-CM | POA: Diagnosis not present

## 2020-06-02 DIAGNOSIS — N390 Urinary tract infection, site not specified: Secondary | ICD-10-CM | POA: Diagnosis not present

## 2020-06-02 DIAGNOSIS — G8929 Other chronic pain: Secondary | ICD-10-CM | POA: Diagnosis not present

## 2020-06-02 DIAGNOSIS — B964 Proteus (mirabilis) (morganii) as the cause of diseases classified elsewhere: Secondary | ICD-10-CM | POA: Diagnosis not present

## 2020-06-02 DIAGNOSIS — G35 Multiple sclerosis: Secondary | ICD-10-CM | POA: Diagnosis not present

## 2020-06-02 DIAGNOSIS — R1111 Vomiting without nausea: Secondary | ICD-10-CM | POA: Diagnosis not present

## 2020-06-03 ENCOUNTER — Ambulatory Visit (INDEPENDENT_AMBULATORY_CARE_PROVIDER_SITE_OTHER): Payer: Medicare Other | Admitting: Internal Medicine

## 2020-06-03 DIAGNOSIS — G8929 Other chronic pain: Secondary | ICD-10-CM | POA: Diagnosis not present

## 2020-06-03 DIAGNOSIS — M170 Bilateral primary osteoarthritis of knee: Secondary | ICD-10-CM | POA: Diagnosis not present

## 2020-06-03 DIAGNOSIS — Z7901 Long term (current) use of anticoagulants: Secondary | ICD-10-CM | POA: Diagnosis not present

## 2020-06-03 DIAGNOSIS — G35 Multiple sclerosis: Secondary | ICD-10-CM | POA: Diagnosis not present

## 2020-06-03 DIAGNOSIS — N390 Urinary tract infection, site not specified: Secondary | ICD-10-CM | POA: Diagnosis not present

## 2020-06-03 DIAGNOSIS — R1111 Vomiting without nausea: Secondary | ICD-10-CM | POA: Diagnosis not present

## 2020-06-03 DIAGNOSIS — B964 Proteus (mirabilis) (morganii) as the cause of diseases classified elsewhere: Secondary | ICD-10-CM | POA: Diagnosis not present

## 2020-06-03 LAB — POCT INR: INR: 5.3 — AB (ref 2.0–3.0)

## 2020-06-04 ENCOUNTER — Inpatient Hospital Stay (HOSPITAL_COMMUNITY)
Admission: EM | Admit: 2020-06-04 | Discharge: 2020-06-11 | DRG: 871 | Disposition: A | Discharge status: Home Health | Payer: Medicare Other | Attending: Internal Medicine | Admitting: Internal Medicine

## 2020-06-04 ENCOUNTER — Emergency Department (HOSPITAL_COMMUNITY): Payer: Medicare Other

## 2020-06-04 ENCOUNTER — Emergency Department (HOSPITAL_COMMUNITY)
Admission: EM | Admit: 2020-06-04 | Discharge: 2020-06-04 | Disposition: A | Discharge status: Home / Self Care | Payer: Medicare Other | Source: Home / Self Care | Attending: Emergency Medicine | Admitting: Emergency Medicine

## 2020-06-04 ENCOUNTER — Encounter (HOSPITAL_COMMUNITY): Payer: Self-pay

## 2020-06-04 ENCOUNTER — Other Ambulatory Visit: Payer: Self-pay

## 2020-06-04 ENCOUNTER — Encounter (HOSPITAL_COMMUNITY): Payer: Self-pay | Admitting: Emergency Medicine

## 2020-06-04 DIAGNOSIS — J9621 Acute and chronic respiratory failure with hypoxia: Secondary | ICD-10-CM | POA: Diagnosis present

## 2020-06-04 DIAGNOSIS — J9601 Acute respiratory failure with hypoxia: Secondary | ICD-10-CM | POA: Diagnosis not present

## 2020-06-04 DIAGNOSIS — R0902 Hypoxemia: Secondary | ICD-10-CM | POA: Diagnosis not present

## 2020-06-04 DIAGNOSIS — G4733 Obstructive sleep apnea (adult) (pediatric): Secondary | ICD-10-CM | POA: Diagnosis present

## 2020-06-04 DIAGNOSIS — Z87440 Personal history of urinary (tract) infections: Secondary | ICD-10-CM

## 2020-06-04 DIAGNOSIS — E039 Hypothyroidism, unspecified: Secondary | ICD-10-CM | POA: Insufficient documentation

## 2020-06-04 DIAGNOSIS — F32A Depression, unspecified: Secondary | ICD-10-CM | POA: Diagnosis present

## 2020-06-04 DIAGNOSIS — Z7401 Bed confinement status: Secondary | ICD-10-CM

## 2020-06-04 DIAGNOSIS — F419 Anxiety disorder, unspecified: Secondary | ICD-10-CM | POA: Diagnosis present

## 2020-06-04 DIAGNOSIS — R21 Rash and other nonspecific skin eruption: Secondary | ICD-10-CM | POA: Diagnosis not present

## 2020-06-04 DIAGNOSIS — E119 Type 2 diabetes mellitus without complications: Secondary | ICD-10-CM

## 2020-06-04 DIAGNOSIS — R0602 Shortness of breath: Secondary | ICD-10-CM | POA: Diagnosis not present

## 2020-06-04 DIAGNOSIS — Z7901 Long term (current) use of anticoagulants: Secondary | ICD-10-CM | POA: Insufficient documentation

## 2020-06-04 DIAGNOSIS — Z79890 Hormone replacement therapy (postmenopausal): Secondary | ICD-10-CM

## 2020-06-04 DIAGNOSIS — I959 Hypotension, unspecified: Secondary | ICD-10-CM | POA: Diagnosis not present

## 2020-06-04 DIAGNOSIS — I361 Nonrheumatic tricuspid (valve) insufficiency: Secondary | ICD-10-CM | POA: Diagnosis not present

## 2020-06-04 DIAGNOSIS — J9811 Atelectasis: Secondary | ICD-10-CM | POA: Diagnosis not present

## 2020-06-04 DIAGNOSIS — G9341 Metabolic encephalopathy: Secondary | ICD-10-CM

## 2020-06-04 DIAGNOSIS — A419 Sepsis, unspecified organism: Secondary | ICD-10-CM | POA: Diagnosis present

## 2020-06-04 DIAGNOSIS — N390 Urinary tract infection, site not specified: Secondary | ICD-10-CM

## 2020-06-04 DIAGNOSIS — M7989 Other specified soft tissue disorders: Secondary | ICD-10-CM | POA: Diagnosis not present

## 2020-06-04 DIAGNOSIS — Z86718 Personal history of other venous thrombosis and embolism: Secondary | ICD-10-CM | POA: Diagnosis not present

## 2020-06-04 DIAGNOSIS — G35B0 Primary progressive multiple sclerosis, unspecified: Secondary | ICD-10-CM | POA: Diagnosis present

## 2020-06-04 DIAGNOSIS — Z809 Family history of malignant neoplasm, unspecified: Secondary | ICD-10-CM

## 2020-06-04 DIAGNOSIS — J45909 Unspecified asthma, uncomplicated: Secondary | ICD-10-CM | POA: Insufficient documentation

## 2020-06-04 DIAGNOSIS — I4891 Unspecified atrial fibrillation: Secondary | ICD-10-CM

## 2020-06-04 DIAGNOSIS — I340 Nonrheumatic mitral (valve) insufficiency: Secondary | ICD-10-CM | POA: Diagnosis not present

## 2020-06-04 DIAGNOSIS — R0689 Other abnormalities of breathing: Secondary | ICD-10-CM | POA: Diagnosis not present

## 2020-06-04 DIAGNOSIS — I951 Orthostatic hypotension: Secondary | ICD-10-CM | POA: Diagnosis present

## 2020-06-04 DIAGNOSIS — Z9841 Cataract extraction status, right eye: Secondary | ICD-10-CM

## 2020-06-04 DIAGNOSIS — I110 Hypertensive heart disease with heart failure: Secondary | ICD-10-CM | POA: Diagnosis present

## 2020-06-04 DIAGNOSIS — I517 Cardiomegaly: Secondary | ICD-10-CM | POA: Diagnosis not present

## 2020-06-04 DIAGNOSIS — R5381 Other malaise: Secondary | ICD-10-CM | POA: Diagnosis not present

## 2020-06-04 DIAGNOSIS — Z83438 Family history of other disorder of lipoprotein metabolism and other lipidemia: Secondary | ICD-10-CM

## 2020-06-04 DIAGNOSIS — G8929 Other chronic pain: Secondary | ICD-10-CM | POA: Diagnosis present

## 2020-06-04 DIAGNOSIS — R6520 Severe sepsis without septic shock: Secondary | ICD-10-CM

## 2020-06-04 DIAGNOSIS — Z79899 Other long term (current) drug therapy: Secondary | ICD-10-CM | POA: Insufficient documentation

## 2020-06-04 DIAGNOSIS — I499 Cardiac arrhythmia, unspecified: Secondary | ICD-10-CM | POA: Diagnosis not present

## 2020-06-04 DIAGNOSIS — I509 Heart failure, unspecified: Secondary | ICD-10-CM | POA: Insufficient documentation

## 2020-06-04 DIAGNOSIS — G4731 Primary central sleep apnea: Secondary | ICD-10-CM | POA: Diagnosis present

## 2020-06-04 DIAGNOSIS — Z85528 Personal history of other malignant neoplasm of kidney: Secondary | ICD-10-CM | POA: Insufficient documentation

## 2020-06-04 DIAGNOSIS — G35 Multiple sclerosis: Secondary | ICD-10-CM | POA: Diagnosis present

## 2020-06-04 DIAGNOSIS — R509 Fever, unspecified: Secondary | ICD-10-CM | POA: Insufficient documentation

## 2020-06-04 DIAGNOSIS — K5909 Other constipation: Secondary | ICD-10-CM | POA: Diagnosis present

## 2020-06-04 DIAGNOSIS — I5033 Acute on chronic diastolic (congestive) heart failure: Secondary | ICD-10-CM | POA: Diagnosis present

## 2020-06-04 DIAGNOSIS — K219 Gastro-esophageal reflux disease without esophagitis: Secondary | ICD-10-CM | POA: Diagnosis present

## 2020-06-04 DIAGNOSIS — Z888 Allergy status to other drugs, medicaments and biological substances status: Secondary | ICD-10-CM

## 2020-06-04 DIAGNOSIS — E785 Hyperlipidemia, unspecified: Secondary | ICD-10-CM | POA: Diagnosis present

## 2020-06-04 DIAGNOSIS — R000 Tachycardia, unspecified: Secondary | ICD-10-CM | POA: Diagnosis not present

## 2020-06-04 DIAGNOSIS — Z20822 Contact with and (suspected) exposure to covid-19: Secondary | ICD-10-CM | POA: Insufficient documentation

## 2020-06-04 DIAGNOSIS — Z8249 Family history of ischemic heart disease and other diseases of the circulatory system: Secondary | ICD-10-CM

## 2020-06-04 DIAGNOSIS — Z8349 Family history of other endocrine, nutritional and metabolic diseases: Secondary | ICD-10-CM

## 2020-06-04 DIAGNOSIS — R0600 Dyspnea, unspecified: Secondary | ICD-10-CM | POA: Diagnosis not present

## 2020-06-04 DIAGNOSIS — R410 Disorientation, unspecified: Secondary | ICD-10-CM | POA: Diagnosis not present

## 2020-06-04 DIAGNOSIS — Z8616 Personal history of COVID-19: Secondary | ICD-10-CM

## 2020-06-04 DIAGNOSIS — R140 Abdominal distension (gaseous): Secondary | ICD-10-CM | POA: Diagnosis not present

## 2020-06-04 DIAGNOSIS — M2550 Pain in unspecified joint: Secondary | ICD-10-CM | POA: Diagnosis not present

## 2020-06-04 DIAGNOSIS — Z9842 Cataract extraction status, left eye: Secondary | ICD-10-CM

## 2020-06-04 DIAGNOSIS — R002 Palpitations: Secondary | ICD-10-CM | POA: Diagnosis present

## 2020-06-04 DIAGNOSIS — G4730 Sleep apnea, unspecified: Secondary | ICD-10-CM | POA: Diagnosis present

## 2020-06-04 DIAGNOSIS — R109 Unspecified abdominal pain: Secondary | ICD-10-CM | POA: Diagnosis not present

## 2020-06-04 DIAGNOSIS — I4821 Permanent atrial fibrillation: Secondary | ICD-10-CM | POA: Diagnosis present

## 2020-06-04 DIAGNOSIS — I4819 Other persistent atrial fibrillation: Secondary | ICD-10-CM | POA: Diagnosis not present

## 2020-06-04 DIAGNOSIS — R531 Weakness: Secondary | ICD-10-CM | POA: Diagnosis not present

## 2020-06-04 DIAGNOSIS — Z87891 Personal history of nicotine dependence: Secondary | ICD-10-CM | POA: Insufficient documentation

## 2020-06-04 DIAGNOSIS — Z885 Allergy status to narcotic agent status: Secondary | ICD-10-CM

## 2020-06-04 DIAGNOSIS — Z818 Family history of other mental and behavioral disorders: Secondary | ICD-10-CM

## 2020-06-04 DIAGNOSIS — C649 Malignant neoplasm of unspecified kidney, except renal pelvis: Secondary | ICD-10-CM | POA: Diagnosis present

## 2020-06-04 HISTORY — DX: Heart failure, unspecified: I50.9

## 2020-06-04 HISTORY — DX: Sepsis, unspecified organism: A41.9

## 2020-06-04 HISTORY — DX: Type 2 diabetes mellitus without complications: E11.9

## 2020-06-04 HISTORY — DX: Metabolic encephalopathy: G93.41

## 2020-06-04 LAB — URINALYSIS, ROUTINE W REFLEX MICROSCOPIC
Bacteria, UA: NONE SEEN
Bilirubin Urine: NEGATIVE
Glucose, UA: NEGATIVE mg/dL
Hgb urine dipstick: NEGATIVE
Ketones, ur: NEGATIVE mg/dL
Nitrite: NEGATIVE
Protein, ur: NEGATIVE mg/dL
Specific Gravity, Urine: 1.005 (ref 1.005–1.030)
pH: 7 (ref 5.0–8.0)

## 2020-06-04 LAB — COMPREHENSIVE METABOLIC PANEL
ALT: 18 U/L (ref 0–44)
ALT: 15 U/L (ref 0–44)
AST: 15 U/L (ref 15–41)
AST: 13 U/L — ABNORMAL LOW (ref 15–41)
Albumin: 3.1 g/dL — ABNORMAL LOW (ref 3.5–5.0)
Albumin: 2.7 g/dL — ABNORMAL LOW (ref 3.5–5.0)
Alkaline Phosphatase: 45 U/L (ref 38–126)
Alkaline Phosphatase: 42 U/L (ref 38–126)
Anion gap: 6 (ref 5–15)
Anion gap: 9 (ref 5–15)
BUN: 15 mg/dL (ref 8–23)
BUN: 13 mg/dL (ref 8–23)
CO2: 32 mmol/L (ref 22–32)
CO2: 30 mmol/L (ref 22–32)
Calcium: 8.6 mg/dL — ABNORMAL LOW (ref 8.9–10.3)
Calcium: 8.4 mg/dL — ABNORMAL LOW (ref 8.9–10.3)
Chloride: 100 mmol/L (ref 98–111)
Chloride: 100 mmol/L (ref 98–111)
Creatinine, Ser: 0.76 mg/dL (ref 0.44–1.00)
Creatinine, Ser: 0.39 mg/dL — ABNORMAL LOW (ref 0.44–1.00)
GFR, Estimated: >60 mL/min (ref >60)
GFR, Estimated: >60 mL/min (ref >60)
Glucose, Bld: 101 mg/dL — ABNORMAL HIGH (ref 70–99)
Glucose, Bld: 79 mg/dL (ref 70–99)
Potassium: 4.3 mmol/L (ref 3.5–5.1)
Potassium: 4 mmol/L (ref 3.5–5.1)
Sodium: 138 mmol/L (ref 135–145)
Sodium: 139 mmol/L (ref 135–145)
Total Bilirubin: 0.3 mg/dL (ref 0.3–1.2)
Total Bilirubin: 0.9 mg/dL (ref 0.3–1.2)
Total Protein: 5.7 g/dL — ABNORMAL LOW (ref 6.5–8.1)
Total Protein: 5.1 g/dL — ABNORMAL LOW (ref 6.5–8.1)

## 2020-06-04 LAB — APTT
aPTT: 40 seconds — ABNORMAL HIGH (ref 24–36)
aPTT: 43 seconds — ABNORMAL HIGH (ref 24–36)

## 2020-06-04 LAB — RESPIRATORY PANEL BY RT PCR (FLU A&B, COVID)
Influenza A by PCR: NEGATIVE
Influenza B by PCR: NEGATIVE
SARS Coronavirus 2 by RT PCR: NEGATIVE

## 2020-06-04 LAB — CBC WITH DIFFERENTIAL/PLATELET
Abs Immature Granulocytes: 0.03 10*3/uL (ref 0.00–0.07)
Abs Immature Granulocytes: 0.03 10*3/uL (ref 0.00–0.07)
Basophils Absolute: 0.1 10*3/uL (ref 0.0–0.1)
Basophils Absolute: 0.1 10*3/uL (ref 0.0–0.1)
Basophils Relative: 1 %
Basophils Relative: 1 %
Eosinophils Absolute: 0.5 10*3/uL (ref 0.0–0.5)
Eosinophils Absolute: 0.4 10*3/uL (ref 0.0–0.5)
Eosinophils Relative: 4 %
Eosinophils Relative: 4 %
HCT: 38.3 % (ref 36.0–46.0)
HCT: 36.4 % (ref 36.0–46.0)
Hemoglobin: 12.4 g/dL (ref 12.0–15.0)
Hemoglobin: 11.8 g/dL — ABNORMAL LOW (ref 12.0–15.0)
Immature Granulocytes: 0 %
Immature Granulocytes: 0 %
Lymphocytes Relative: 17 %
Lymphocytes Relative: 12 %
Lymphs Abs: 1.9 10*3/uL (ref 0.7–4.0)
Lymphs Abs: 1.3 10*3/uL (ref 0.7–4.0)
MCH: 30.8 pg (ref 26.0–34.0)
MCH: 31.3 pg (ref 26.0–34.0)
MCHC: 32.4 g/dL (ref 30.0–36.0)
MCHC: 32.4 g/dL (ref 30.0–36.0)
MCV: 95 fL (ref 80.0–100.0)
MCV: 96.6 fL (ref 80.0–100.0)
Monocytes Absolute: 1.3 10*3/uL — ABNORMAL HIGH (ref 0.1–1.0)
Monocytes Absolute: 1.2 10*3/uL — ABNORMAL HIGH (ref 0.1–1.0)
Monocytes Relative: 12 %
Monocytes Relative: 11 %
Neutro Abs: 7.5 10*3/uL (ref 1.7–7.7)
Neutro Abs: 7.6 10*3/uL (ref 1.7–7.7)
Neutrophils Relative %: 66 %
Neutrophils Relative %: 72 %
Platelets: 165 10*3/uL (ref 150–400)
Platelets: 142 10*3/uL — ABNORMAL LOW (ref 150–400)
RBC: 4.03 MIL/uL (ref 3.87–5.11)
RBC: 3.77 MIL/uL — ABNORMAL LOW (ref 3.87–5.11)
RDW: 13.2 % (ref 11.5–15.5)
RDW: 13.2 % (ref 11.5–15.5)
WBC: 11.2 10*3/uL — ABNORMAL HIGH (ref 4.0–10.5)
WBC: 10.4 10*3/uL (ref 4.0–10.5)
nRBC: 0 % (ref 0.0–0.2)
nRBC: 0 % (ref 0.0–0.2)

## 2020-06-04 LAB — LACTIC ACID, PLASMA
Lactic Acid, Venous: 0.9 mmol/L (ref 0.5–1.9)
Lactic Acid, Venous: 1 mmol/L (ref 0.5–1.9)
Lactic Acid, Venous: 0.6 mmol/L (ref 0.5–1.9)

## 2020-06-04 LAB — PROTIME-INR
INR: 4.5 (ref 0.8–1.2)
INR: 2.7 — ABNORMAL HIGH (ref 0.8–1.2)
Prothrombin Time: 41.1 seconds — ABNORMAL HIGH (ref 11.4–15.2)
Prothrombin Time: 27.7 seconds — ABNORMAL HIGH (ref 11.4–15.2)

## 2020-06-04 LAB — AMMONIA: Ammonia: 20 umol/L (ref 9–35)

## 2020-06-04 MED ORDER — LACTATED RINGERS IV BOLUS (SEPSIS)
1000.0000 mL | Freq: Once | INTRAVENOUS | Status: AC
  Administered 2020-06-04: 1000 mL via INTRAVENOUS

## 2020-06-04 MED ORDER — METRONIDAZOLE IN NACL 5-0.79 MG/ML-% IV SOLN
500.0000 mg | Freq: Once | INTRAVENOUS | Status: AC
  Administered 2020-06-04: 500 mg via INTRAVENOUS
  Filled 2020-06-04: qty 100

## 2020-06-04 MED ORDER — ONDANSETRON HCL 4 MG PO TABS
8.0000 mg | ORAL_TABLET | Freq: Every morning | ORAL | Status: DC
  Administered 2020-06-05 – 2020-06-11 (×7): 8 mg via ORAL
  Filled 2020-06-04 (×7): qty 2

## 2020-06-04 MED ORDER — LACTATED RINGERS IV SOLN: INTRAVENOUS | Status: AC

## 2020-06-04 MED ORDER — BUPROPION HCL ER (XL) 150 MG PO TB24
300.0000 mg | ORAL_TABLET | Freq: Every day | ORAL | Status: DC
  Administered 2020-06-05 – 2020-06-11 (×7): 300 mg via ORAL
  Filled 2020-06-04 (×7): qty 2

## 2020-06-04 MED ORDER — LINACLOTIDE 145 MCG PO CAPS
145.0000 ug | ORAL_CAPSULE | Freq: Every day | ORAL | Status: DC
  Administered 2020-06-05 – 2020-06-11 (×7): 145 ug via ORAL
  Filled 2020-06-04 (×7): qty 1

## 2020-06-04 MED ORDER — WARFARIN SODIUM 2.5 MG PO TABS
2.5000 mg | ORAL_TABLET | Freq: Once | ORAL | Status: DC
  Filled 2020-06-04: qty 1

## 2020-06-04 MED ORDER — SODIUM CHLORIDE 0.9 % IV SOLN
2.0000 g | Freq: Once | INTRAVENOUS | Status: AC
  Administered 2020-06-04: 2 g via INTRAVENOUS
  Filled 2020-06-04: qty 2

## 2020-06-04 MED ORDER — INSULIN ASPART 100 UNIT/ML ~~LOC~~ SOLN: 0.0000 [IU] | Freq: Three times a day (TID) | SUBCUTANEOUS | Status: DC

## 2020-06-04 MED ORDER — SODIUM CHLORIDE 0.9 % IV BOLUS: 500.0000 mL | Freq: Once | INTRAVENOUS | Status: DC

## 2020-06-04 MED ORDER — TAMSULOSIN HCL 0.4 MG PO CAPS
0.4000 mg | ORAL_CAPSULE | Freq: Every morning | ORAL | Status: DC
  Administered 2020-06-05 – 2020-06-11 (×7): 0.4 mg via ORAL
  Filled 2020-06-04 (×7): qty 1

## 2020-06-04 MED ORDER — LACTATED RINGERS IV SOLN: INTRAVENOUS | Status: DC

## 2020-06-04 MED ORDER — ONDANSETRON HCL 4 MG/2ML IJ SOLN: 4.0000 mg | Freq: Four times a day (QID) | INTRAMUSCULAR | Status: DC | PRN

## 2020-06-04 MED ORDER — SIMVASTATIN 20 MG PO TABS
10.0000 mg | ORAL_TABLET | Freq: Every evening | ORAL | Status: DC
  Administered 2020-06-05 – 2020-06-11 (×8): 10 mg via ORAL
  Filled 2020-06-04 (×8): qty 1

## 2020-06-04 MED ORDER — VANCOMYCIN HCL 1750 MG/350ML IV SOLN
1750.0000 mg | Freq: Once | INTRAVENOUS | Status: AC
  Administered 2020-06-04: 1750 mg via INTRAVENOUS
  Filled 2020-06-04 (×2): qty 350

## 2020-06-04 MED ORDER — WARFARIN - PHARMACIST DOSING INPATIENT: Freq: Every day | Status: DC

## 2020-06-04 MED ORDER — ACETAMINOPHEN 325 MG PO TABS
650.0000 mg | ORAL_TABLET | Freq: Once | ORAL | Status: AC
  Administered 2020-06-04: 650 mg via ORAL
  Filled 2020-06-04: qty 2

## 2020-06-04 MED ORDER — ACETAMINOPHEN 500 MG PO TABS: 500.0000 mg | ORAL_TABLET | Freq: Four times a day (QID) | ORAL | Status: DC | PRN

## 2020-06-04 MED ORDER — ACETAMINOPHEN 650 MG RE SUPP: 650.0000 mg | Freq: Four times a day (QID) | RECTAL | Status: DC | PRN

## 2020-06-04 MED ORDER — SODIUM CHLORIDE 0.9 % IV SOLN
2.0000 g | Freq: Three times a day (TID) | INTRAVENOUS | Status: DC
  Administered 2020-06-05 – 2020-06-09 (×15): 2 g via INTRAVENOUS
  Filled 2020-06-04 (×16): qty 2

## 2020-06-04 MED ORDER — VANCOMYCIN HCL IN DEXTROSE 1-5 GM/200ML-% IV SOLN: 1000.0000 mg | Freq: Once | INTRAVENOUS | Status: DC

## 2020-06-04 MED ORDER — ACETAMINOPHEN 325 MG PO TABS
650.0000 mg | ORAL_TABLET | Freq: Four times a day (QID) | ORAL | Status: DC | PRN
  Administered 2020-06-04 – 2020-06-07 (×3): 650 mg via ORAL
  Filled 2020-06-04 (×3): qty 2

## 2020-06-04 MED ORDER — PANTOPRAZOLE SODIUM 40 MG PO TBEC
40.0000 mg | DELAYED_RELEASE_TABLET | Freq: Every day | ORAL | Status: DC
  Administered 2020-06-05 – 2020-06-11 (×7): 40 mg via ORAL
  Filled 2020-06-04 (×7): qty 1

## 2020-06-04 MED ORDER — IOHEXOL 300 MG/ML  SOLN
100.0000 mL | Freq: Once | INTRAMUSCULAR | Status: AC | PRN
  Administered 2020-06-04: 100 mL via INTRAVENOUS

## 2020-06-04 MED ORDER — OXYBUTYNIN CHLORIDE 5 MG PO TABS: 5.0000 mg | ORAL_TABLET | Freq: Three times a day (TID) | ORAL | Status: DC | PRN

## 2020-06-04 MED ORDER — BISACODYL 10 MG RE SUPP: 10.0000 mg | Freq: Every day | RECTAL | Status: DC | PRN

## 2020-06-04 MED ORDER — ACETAMINOPHEN 650 MG RE SUPP: 650.0000 mg | Freq: Once | RECTAL | Status: DC

## 2020-06-04 MED ORDER — MELATONIN 5 MG PO TABS
5.0000 mg | ORAL_TABLET | Freq: Every day | ORAL | Status: DC
  Filled 2020-06-04 (×2): qty 1

## 2020-06-04 MED ORDER — DOCUSATE SODIUM 100 MG PO CAPS
200.0000 mg | ORAL_CAPSULE | Freq: Every day | ORAL | Status: DC | PRN
  Administered 2020-06-09: 200 mg via ORAL

## 2020-06-04 MED ORDER — ACETAMINOPHEN 325 MG PO TABS: 650.0000 mg | ORAL_TABLET | Freq: Once | ORAL | Status: DC

## 2020-06-04 MED ORDER — PSYLLIUM 95 % PO PACK
1.0000 | PACK | Freq: Every evening | ORAL | Status: DC
  Administered 2020-06-05 – 2020-06-11 (×6): 1 via ORAL
  Filled 2020-06-04 (×8): qty 1

## 2020-06-04 MED ORDER — ONDANSETRON HCL 4 MG PO TABS: 4.0000 mg | ORAL_TABLET | Freq: Four times a day (QID) | ORAL | Status: DC | PRN

## 2020-06-04 MED ORDER — LACTULOSE 10 GM/15ML PO SOLN
10.0000 g | Freq: Every day | ORAL | Status: DC
  Administered 2020-06-05 – 2020-06-11 (×5): 10 g via ORAL
  Filled 2020-06-04 (×6): qty 15

## 2020-06-04 MED ORDER — ALPRAZOLAM 0.5 MG PO TABS
0.5000 mg | ORAL_TABLET | Freq: Three times a day (TID) | ORAL | Status: DC | PRN
  Administered 2020-06-04 – 2020-06-11 (×8): 0.5 mg via ORAL
  Filled 2020-06-04: qty 1
  Filled 2020-06-04: qty 2
  Filled 2020-06-04 (×6): qty 1

## 2020-06-04 MED ORDER — HYDROCORTISONE NA SUCCINATE PF 100 MG IJ SOLR
50.0000 mg | Freq: Four times a day (QID) | INTRAMUSCULAR | Status: DC
  Filled 2020-06-04: qty 2

## 2020-06-04 MED ORDER — METRONIDAZOLE IN NACL 5-0.79 MG/ML-% IV SOLN
500.0000 mg | Freq: Three times a day (TID) | INTRAVENOUS | Status: DC
  Administered 2020-06-05 – 2020-06-06 (×5): 500 mg via INTRAVENOUS
  Filled 2020-06-04 (×5): qty 100

## 2020-06-04 MED ORDER — INSULIN ASPART 100 UNIT/ML ~~LOC~~ SOLN: 0.0000 [IU] | Freq: Every day | SUBCUTANEOUS | Status: DC

## 2020-06-04 MED ORDER — LEVOTHYROXINE SODIUM 75 MCG PO TABS
75.0000 ug | ORAL_TABLET | Freq: Every day | ORAL | Status: DC
  Administered 2020-06-05 – 2020-06-11 (×7): 75 ug via ORAL
  Filled 2020-06-04 (×7): qty 1

## 2020-06-04 MED ORDER — GABAPENTIN 300 MG PO CAPS
300.0000 mg | ORAL_CAPSULE | Freq: Four times a day (QID) | ORAL | Status: DC
  Administered 2020-06-04 – 2020-06-11 (×28): 300 mg via ORAL
  Filled 2020-06-04 (×28): qty 1

## 2020-06-04 MED ORDER — VANCOMYCIN HCL IN DEXTROSE 1-5 GM/200ML-% IV SOLN
1000.0000 mg | Freq: Two times a day (BID) | INTRAVENOUS | Status: DC
  Administered 2020-06-05 – 2020-06-06 (×3): 1000 mg via INTRAVENOUS
  Filled 2020-06-04 (×4): qty 200

## 2020-06-04 MED ORDER — FAMOTIDINE 20 MG PO TABS
20.0000 mg | ORAL_TABLET | Freq: Every day | ORAL | Status: DC
  Administered 2020-06-04 – 2020-06-10 (×7): 20 mg via ORAL
  Filled 2020-06-04 (×7): qty 1

## 2020-06-04 NOTE — ED Notes (Signed)
Marletta Lor daughter [phone]

## 2020-06-04 NOTE — ED Notes (Signed)
EDP notified on elevated PT/INR results.

## 2020-06-04 NOTE — ED Triage Notes (Signed)
Patient arrived EMS from home reports palpitations this evening , Afib=140's , fever with history of recurrent UTI's , CBG= 120.

## 2020-06-04 NOTE — ED Triage Notes (Signed)
Pt presents w/ c/o worsening AMS, Fever, and malaise. Pt recently d/cd for afibRVR. Pt states they have had a fever for the last couple of days endorsing mild drowsiness and malaise.   A/o x 3

## 2020-06-04 NOTE — ED Notes (Signed)
Patient transported to CT scan . 

## 2020-06-04 NOTE — H&P (Signed)
History and Physical    Toni [name] UMP:[id number] DOB: 12/03/1945 DOA: 06/04/2020  PCP: Prince Solian, MD  Patient coming from: Home  I have personally briefly reviewed patient's old medical records in [place]  Chief Complaint: Fever, AMS  HPI: Toni [name] is a 74 y.o. female with medical history significant of MS, a.fib on coumadin, OSA on BIPAP, uses oxygen with BIPAP at night only, frequent recurrent UTIs due to [place].  Pt seen in ED this AM for palipations, possible UTI.  Tm at that time was 99.9.  Discharged.  At home: pt took nap, woke up and was confused per family.  EMS called.  O2 sat per EMS 70, Temp 100.  Patient denies: Cough, SOB, CP, N/V, changes in bowel habits (has chronic constipation from MS).  Has mild LLQ abd TTP.   ED Course: Tm 101.3.  Initial BP 87/58.  Improves to 400 systolic after 30 ml/kg bolus (this about baseline per pt and daughter).  AMS is resolved at this time.  WBC 10.4k. was 11.2k this AM  HR 68-119  INR 2.7.  CXR neg, UA neg, CT abd/pelvis: shows constipation and a small renal cell carcinoma that is known (just being followed at the moment as it hasnt done much for years).  COVID and flu neg.  Started on empiric cefepime, flagyl, vanc.   Review of Systems: As per HPI, otherwise all review of systems negative.  Past Medical History:  Diagnosis Date  . Abnormality of gait 03/01/2015  . Anxiety   . Arthritis    "knees" (01/19/2016)  . Asthma    as a child   . Back pain   . Cancer ([place])    / RENAL CELL CARCINOMA - GETS CT SCAN EVERY 6 MONTHS TO WATCH   . CHF (congestive heart failure) ([place])   . Chronic pain    "nerve pain from the MS"  . Complex atypical endometrial hyperplasia   . Constipation   . Depression   . Diabetes mellitus without complication ([place])   . DVT (deep venous thrombosis) ([place]) 1983   "RLE; may have just been phlebitis; it was before the age of dopplers"  . Dysrhythmia    afib   . Edema     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' ablation of R GSV 2012  . GERD (gastroesophageal reflux disease)   . HLD (hyperlipidemia)   . Hypertension   . Hypotension   . Hypothyroidism   . Joint pain   . Multiple sclerosis ([place])    has had this may 1989-[facility] reg doc  . OSA on CPAP    bipap machine - SLEEP APNEA WORSE SINCE COVID IN 1/21 PER PATIENT SETTING HAS INCREASED FROM 9 TO 30   . Osteoarthritis   . PAF (paroxysmal atrial fibrillation) ([place])    on coumadin; documented on monitor 06/2010  . Pneumonia 11/2011  . Sleep apnea     Past Surgical History:  Procedure Laterality Date  . ANTERIOR CERVICAL DECOMP/DISCECTOMY FUSION  01/2004  . CARDIAC CATHETERIZATION  06/22/2010   normal coronary arteries, PAF  . CATARACT EXTRACTION, BILATERAL Bilateral   . CHILD BIRTHS     X2  . DILATATION & CURETTAGE/HYSTEROSCOPY WITH MYOSURE N/A 01/15/2020   Procedure: DILATATION & CURETTAGE/HYSTEROSCOPY WITH MYOSURE;  Surgeon: Arvella Nigh, MD;  Location: [facility];  Service: Gynecology;  Laterality: N/A;  pt in wheelchair-has MS  . DILATION AND CURETTAGE OF UTERUS    .  DILATION AND CURETTAGE OF UTERUS N/A 02/16/2020   Procedure: DILATATION AND CURETTAGE;  Surgeon: Lafonda Mosses, MD;  Location: [facility];  Service: Gynecology;  Laterality: N/A;  NOT GENERAL -NO INTUBATION  . INFUSION PUMP IMPLANTATION  ~ 2009   baclofen infusion in lower abd  . INTRAUTERINE DEVICE (IUD) INSERTION N/A 02/16/2020   Procedure: INTRAUTERINE DEVICE (IUD) INSERTION - MIRENA;  Surgeon: Lafonda Mosses, MD;  Location: [facility];  Service: Gynecology;  Laterality: N/A;  . PAIN PUMP REVISION N/A 07/29/2014   Procedure: Baclofen pump replacement;  Surgeon: Erline Levine, MD;  Location: [facility];  Service: Neurosurgery;  Laterality: N/A;  Baclofen pump replacement  . TONSILLECTOMY AND ADENOIDECTOMY    . VARICOSE VEIN SURGERY  ~ 1968  . VENOUS ABLATION  12/16/2010   radiofreq ablation -Dr Elisabeth Cara and [facility]    . WISDOM TOOTH EXTRACTION       reports that she quit smoking about 43 years ago. Her smoking use included cigarettes. She has a 3.50 pack-year smoking history. She has never used smokeless tobacco. She reports previous alcohol use. She reports that she does not use drugs.  Allergies  Allergen Reactions  . Chlorhexidine Hives and Other (See Comments)    And, sores appear where applied  . Hydrocodone Nausea And Vomiting and Other (See Comments)    Hydrocodone causes vomiting  . Oxycodone Nausea And Vomiting and Other (See Comments)    Oral oxycodone causes vomiting  . Zoloft [Sertraline] Hives, Swelling and Rash    Family History  Problem Relation Age of Onset  . Coronary artery disease Father        at age 33  . Hyperlipidemia Father   . Thyroid disease Father   . Coronary artery disease Maternal Grandmother   . Depression Maternal Grandmother   . Cancer Paternal Grandmother        breast  . Depression Mother   . Sudden death Mother   . Anxiety disorder Mother   . Alcoholism Son      Prior to Admission medications   Medication Sig Start Date End Date Taking? Authorizing Provider  acetaminophen (TYLENOL) 500 MG tablet Take 500-1,000 mg by mouth every 6 (six) hours as needed for fever or headache (or pain).   Yes [provider]  ALPRAZolam (XANAX) 0.5 MG tablet Take 1 tablet (0.5 mg total) by mouth 3 (three) times daily as needed for anxiety or sleep. 02/26/20  Yes Dohmeier, Asencion Partridge, MD  baclofen (LIORESAL) 20 MG tablet Take 1 tablet (20 mg total) by mouth 2 (two) times daily as needed (if the pump needs a boost). Patient taking differently: Take 20 mg by mouth 2 (two) times daily as needed for muscle spasms.  04/15/20  Yes Dohmeier, Asencion Partridge, MD  BIOTIN PO Take 1 tablet by mouth daily.   Yes [provider]  buPROPion (WELLBUTRIN XL) 300 MG 24 hr tablet Take 1 tablet (300 mg total) by mouth daily. 08/23/17  Yes Beasley, Caren D, MD  Calcium Carb-Cholecalciferol  (CALCIUM + D3 PO) Take 2 tablets by mouth in the morning and at bedtime.   Yes [provider]  cetirizine (ZYRTEC) 10 MG tablet Take 10 mg by mouth daily as needed for allergies.    Yes [provider]  Cholecalciferol (VITAMIN D3) 50 MCG (2000 UT) TABS Take 2,000 Units by mouth in the morning and at bedtime.   Yes [provider]  Coenzyme Q10 (CO Q-10 PO) Take 1 capsule by mouth daily.  Yes [provider]  corticotropin (ACTHAR) 80 UNIT/ML injectable gel Inject 1 mL (80 Units total) into the muscle every other day. For 10 days each month. Patient taking differently: Inject 80 Units into the skin See admin instructions. Inject 80 units into the skin as directed every other day for a period of 10 days each month 12/01/19  Yes Dohmeier, Asencion Partridge, MD  diltiazem (CARDIZEM CD) 240 MG 24 hr capsule Take 1 capsule (240 mg total) by mouth daily. Patient taking differently: Take 240 mg by mouth in the morning.  01/28/20  Yes Hilty, Nadean Corwin, MD  docusate sodium (COLACE) 100 MG capsule Take 1 capsule (100 mg total) by mouth daily. Patient taking differently: Take 200 mg by mouth daily as needed for mild constipation.  08/12/16  Yes Rai, Ripudeep K, MD  famotidine (PEPCID) 20 MG tablet Take 20 mg by mouth at bedtime.  06/11/18  Yes [provider]  furosemide (LASIX) 20 MG tablet Take 1 tablet by mouth daily, may take second dose if needed for excess edema Patient taking differently: Take 20 mg by mouth in the morning and at bedtime.  09/30/19  Yes Hilty, Nadean Corwin, MD  gabapentin (NEURONTIN) 300 MG capsule TAKE 1 CAPSULE BY MOUTH UP  TO 4 TIMES DAILY Patient taking differently: Take 300 mg by mouth every 6 (six) hours.  03/02/20  Yes Dohmeier, Asencion Partridge, MD  lactulose, encephalopathy, (GENERLAC) 10 GM/15ML SOLN TAKE 15 ML BY MOUTH TWICE DAILY AS NEEDED Patient taking differently: Take 10 g by mouth See admin instructions. Take 10 grams by mouth once a day and an  additional 10 grams once daily, as needed to move the bowels 03/25/20  Yes Dohmeier, Asencion Partridge, MD  levothyroxine (SYNTHROID, LEVOTHROID) 75 MCG tablet Take 75 mcg by mouth daily before breakfast.    Yes [provider]  linaclotide (LINZESS) 145 MCG CAPS capsule Take 1 capsule (145 mcg total) by mouth daily before breakfast. 04/20/20  Yes Thornton Park, MD  melatonin 5 MG TABS Take 5 mg by mouth at bedtime.    Yes [provider]  midodrine (PROAMATINE) 5 MG tablet Take 1 tablet (5 mg total) by mouth 3 (three) times daily with meals. Patient taking differently: Take 5 mg by mouth 2 (two) times daily as needed (for blood pressure).  09/30/19  Yes Hilty, Nadean Corwin, MD  nitrofurantoin (MACRODANTIN) 100 MG capsule Take 100 mg by mouth at bedtime. 04/20/20  Yes [provider]  Omega-3 Fatty Acids (FISH OIL PO) Take 1 capsule by mouth daily.   Yes [provider]  omeprazole (PRILOSEC) 20 MG capsule Take 20 mg by mouth daily before breakfast.    Yes [provider]  ondansetron (ZOFRAN) 8 MG tablet Take 8 mg by mouth every morning.  01/12/20  Yes [provider]  oxybutynin (DITROPAN) 5 MG tablet Take 5 mg by mouth 3 (three) times daily as needed for bladder spasms.  02/09/20  Yes [provider]  psyllium (METAMUCIL SMOOTH TEXTURE) 28 % packet Take 1 packet by mouth every evening.    Yes [provider]  simvastatin (ZOCOR) 10 MG tablet Take 10 mg by mouth every evening.  03/01/18  Yes [provider]  Tamsulosin HCl (FLOMAX) 0.4 MG CAPS Take 0.4 mg by mouth in the morning.    Yes [provider]  warfarin (COUMADIN) 5 MG tablet Take 1-1.5 tablets (5-7.5 mg total) by mouth See admin instructions. Take 5 mg by mouth in the late evening  on Sun/Tues/Wed/Thurs/Fri/Sat and 7.5 mg on Mondays Patient taking differently: Take 2.5-5 mg by mouth See admin instructions. Take 1 tablet (5 mg totally) by mouth in the late evening on  Sun/Tues/Wed/Thurs/Sat. Except 0.5 tablet ( 2.5 mg totally) by mouth on Monday, Friday 04/09/20  Yes Mariel Aloe, MD  diltiazem (CARDIZEM) 60 MG tablet Take 1 tablet (60 mg total) by mouth every 6 (six) hours as needed for up to 7 days (HR sustained >110bpm). Patient not taking: Reported on 06/04/2020 10/17/19 04/07/20  Terrilee Croak, MD  SYRINGE/NEEDLE, DISP, 1 ML (BD LUER-LOK SYRINGE) 25G X 5/8" 1 ML MISC Use as directed to inject  Acthar 06/15/15   Dohmeier, Asencion Partridge, MD    Physical Exam: Vitals:   06/04/20 1726 06/04/20 1730 06/04/20 1800 06/04/20 1945  BP: (!) 87/58 (!) 103/45 (!) 104/47   Pulse: (!) 119 96 93   Resp:  20 16   Temp: 100 F (37.8 C)   (!) 101.3 F (38.5 C)  TempSrc: Oral   Rectal  SpO2: 97% 95% 90%     Constitutional: NAD, calm, comfortable Eyes: PERRL, lids and conjunctivae normal ENMT: Mucous membranes are moist. Posterior pharynx clear of any exudate or lesions.Normal dentition.  Neck: normal, supple, no masses, no thyromegaly Respiratory: clear to auscultation bilaterally, no wheezing, no crackles. Normal respiratory effort. No accessory muscle use.  Cardiovascular: Regular rate and rhythm, no murmurs / rubs / gallops. No extremity edema. 2+ pedal pulses. No carotid bruits.  Abdomen: no tenderness, no masses palpated. No hepatosplenomegaly. Bowel sounds positive.  Musculoskeletal: no clubbing / cyanosis. No joint deformity upper and lower extremities. Good ROM, no contractures. Normal muscle tone.  Skin: no rashes, lesions, ulcers. No induration Neurologic: CN 2-12 grossly intact. Sensation intact, DTR normal. Strength 5/5 in all 4.  Psychiatric: Normal judgment and insight. Alert and oriented x 3. Normal mood.    Labs on Admission: I have personally reviewed following labs and imaging studies  CBC: Recent Labs  Lab 06/04/20 0052 06/04/20 1911  WBC 11.2* 10.4  NEUTROABS 7.5 7.6  HGB 12.4 11.8*  HCT 38.3 36.4  MCV 95.0 96.6  PLT 165 142*   Basic  Metabolic Panel: Recent Labs  Lab 06/04/20 0052 06/04/20 1911  NA 138 139  K 4.3 4.0  CL 100 100  CO2 32 30  GLUCOSE 101* 79  BUN 15 13  CREATININE 0.76 0.39*  CALCIUM 8.6* 8.4*   GFR: Estimated Creatinine Clearance: 64.7 mL/min (A) (by C-G formula based on SCr of 0.39 mg/dL (L)). Liver Function Tests: Recent Labs  Lab 06/04/20 0052 06/04/20 1911  AST 15 13*  ALT 18 15  ALKPHOS 45 42  BILITOT 0.3 0.9  PROT 5.7* 5.1*  ALBUMIN 3.1* 2.7*   No results for input(s): LIPASE, AMYLASE in the last 168 hours. Recent Labs  Lab 06/04/20 1911  AMMONIA 20   Coagulation Profile: Recent Labs  Lab 06/03/20 0000 06/04/20 0052 06/04/20 1911  INR 5.3* 4.5* 2.7*   Cardiac Enzymes: No results for input(s): CKTOTAL, CKMB, CKMBINDEX, TROPONINI in the last 168 hours. BNP (last 3 results) No results for input(s): PROBNP in the last 8760 hours. HbA1C: No results for input(s): HGBA1C in the last 72 hours. CBG: No results for input(s): GLUCAP in the last 168 hours. Lipid Profile: No results for input(s): CHOL, HDL, LDLCALC, TRIG, CHOLHDL, LDLDIRECT in the last 72 hours. Thyroid Function Tests: No results for input(s): TSH, T4TOTAL, FREET4, T3FREE, THYROIDAB in the last 72 hours. Anemia Panel:  No results for input(s): VITAMINB12, FOLATE, FERRITIN, TIBC, IRON, RETICCTPCT in the last 72 hours. Urine analysis:    Component Value Date/Time   COLORURINE YELLOW 06/04/2020 0320   APPEARANCEUR CLEAR 06/04/2020 0320   APPEARANCEUR Clear 12/18/2014 1156   LABSPEC 1.005 06/04/2020 0320   PHURINE 7.0 06/04/2020 0320   GLUCOSEU NEGATIVE 06/04/2020 0320   HGBUR NEGATIVE 06/04/2020 0320   BILIRUBINUR NEGATIVE 06/04/2020 0320   BILIRUBINUR Negative 12/18/2014 1156   KETONESUR NEGATIVE 06/04/2020 0320   PROTEINUR NEGATIVE 06/04/2020 0320   UROBILINOGEN 0.2 08/05/2014 0951   NITRITE NEGATIVE 06/04/2020 0320   LEUKOCYTESUR SMALL (A) 06/04/2020 0320    Radiological Exams on Admission: CT  ABDOMEN PELVIS W CONTRAST  Result Date: 06/04/2020 CLINICAL DATA:  Fever.  UTI.  Generalized abdominal pain. EXAM: CT ABDOMEN AND PELVIS WITH CONTRAST TECHNIQUE: Multidetector CT imaging of the abdomen and pelvis was performed using the standard protocol following bolus administration of intravenous contrast. CONTRAST:  155mL OMNIPAQUE IOHEXOL 300 MG/ML  SOLN COMPARISON:  04/06/2020 FINDINGS: Lower chest: The heart is enlarged. Dependent atelectasis noted bilaterally, right greater than left. Hepatobiliary: No suspicious focal abnormality within the liver parenchyma. There is no evidence for gallstones, gallbladder wall thickening, or pericholecystic fluid. No intrahepatic or extrahepatic biliary dilation. Pancreas: No focal mass lesion. No dilatation of the main duct. No intraparenchymal cyst. No peripancreatic edema. Spleen: No splenomegaly. No focal mass lesion. Adrenals/Urinary Tract: No adrenal nodule or mass. 17 mm lesion upper pole left kidney shows heterogeneous enhancement, as before with no substantial change since 02/25/2019. Tiny hypodensities in the left kidney are similar to prior and remain too small to characterize but likely benign. Mild fullness noted both intrarenal collecting systems without associated hydroureter. Bladder is distended with slight circumferential bladder wall prominence. Stomach/Bowel: Gastric wall thickening likely related to underdistention. No gastric wall thickening. No evidence of outlet obstruction. Duodenum is normally positioned as is the ligament of Treitz. No small bowel wall thickening. No small bowel dilatation. The terminal ileum is normal. The appendix is normal. Large colonic stool volume. Vascular/Lymphatic: No abdominal aortic aneurysm. No abdominal lymphadenopathy. No pelvic sidewall lymphadenopathy. Reproductive: IUD is visualized in the uterus. There is no adnexal mass. Other: No intraperitoneal free fluid. Musculoskeletal: Mild body wall edema evident.  Bones are demineralized. Right-sided spinal device again noted, incompletely visualized. IMPRESSION: 1. No acute findings in the abdomen or pelvis. No findings to explain the patient's history of generalized abdominal pain. 2. Similar appearance heterogeneously enhancing 17 mm lesion upper pole left kidney. Renal cell carcinoma a concern. 3. Prominent colonic stool volume. Imaging features could be compatible with clinical constipation. 4. Prominent gastric wall likely related to underdistention. Electronically Signed   By: Misty Stanley M.D.   On: 06/04/2020 06:33   DG Chest Port 1 View  Result Date: 06/04/2020 CLINICAL DATA:  74 year old female with shortness of breath and fever. EXAM: PORTABLE CHEST 1 VIEW COMPARISON:  Chest CT dated 07/08/2017. chest radiograph dated 06/04/2020. FINDINGS: There are bibasilar interstitial coarsening or atelectasis. No focal consolidation, pleural effusion or pneumothorax. Stable cardiomegaly. Atherosclerotic calcification of the aorta. No acute osseous pathology. Cervical ACDF. IMPRESSION: No active disease. Electronically Signed   By: Anner Crete M.D.   On: 06/04/2020 20:01   DG Chest Port 1 View  Result Date: 06/04/2020 CLINICAL DATA:  Sepsis EXAM: PORTABLE CHEST 1 VIEW COMPARISON:  10/14/2019 FINDINGS: Lungs are well expanded, symmetric, and clear. No pneumothorax or pleural effusion. Cardiac size within normal limits. Pulmonary vascularity is normal.  Osseous structures are age-appropriate. No acute bone abnormality. IMPRESSION: No active disease. Electronically Signed   By: Fidela Salisbury MD   On: 06/04/2020 01:46    EKG: Independently reviewed.  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 Sepsis, new O2 requirement - 1. ? Underlying PNA not seen on CXR? 2. Repeat CXR in AM 3. BCx, UCx pending 4. COVID neg, flu neg 5. Tylenol PRN fever 6. Sepsis pathway 7. Got 50ml/kg LR 8. Repeat labs in AM 9. Check procalcitonin 10. Will hold off on steroids for the moment given improvement in condition and BP (at pt and daughter request).  If BP drops again, then give stress dose steroids. 11. Cont empiric cefepime / flagyl / vanc 2. A.Fib - 1. Cont coumadin 2. Holding cardizem for the moment given initial low BPs in the ED 3. Decide in AM wether to resume cardizem vs use other strategy for rate control 4. Tele monitor 3. Acute metabolic encephalopathy - 1. Resolved at the moment, probably delirium due to fever 4. OSA on BIPAP - 1. BIPAP QHS per home settings 5. Constipation - (chronic it seems) 1. Cont home bowel regimen 2. PRN dulcolax 6. DM2 - 1. Mod scale SSI AC/HS 7. MS - 1. Takes acthar 10 days each month 2. Not currently on acthar  DVT prophylaxis: Coumadin Code Status: Full Family Communication: Daughter at bedside Disposition Plan: Home after sepsis treated Consults called: None Admission status: Admit to inpatient  Severity of Illness: The appropriate patient status for this patient is INPATIENT. Inpatient status is judged to be reasonable and necessary in order to provide the required intensity of service to ensure the patient's safety. The patient's presenting symptoms, physical exam findings, and initial radiographic and laboratory data in the context of their chronic comorbidities is felt to place them at high risk for further clinical deterioration. Furthermore, it is not anticipated that the patient will be medically stable for discharge from the hospital within 2  midnights of admission. The following factors support the patient status of inpatient.   IP status due to sepsis with initial hypotension and new O2 requirement.   * I certify that at the point of admission it is my clinical judgment that the patient will require inpatient hospital care spanning beyond 2 midnights from the point of admission due to high intensity of service, high risk for further deterioration and high frequency of surveillance required.*    Berenice Oehlert M. DO [facility]  How to contact the [facility] Attending or Consulting provider [place] or covering provider during after hours [place], for this patient?  1. Check the care team in [facility] and look for a) attending/consulting [facility] provider listed and b) the [facility] team listed 2. Log into [email or web address]  Amion Physician Scheduling and messaging for groups and whole hospitals  On call and physician scheduling software for group practices, residents, hospitalists and other medical providers for call, clinic, rotation and shift schedules. On[employer] is a hospital-wide system for scheduling doctors and paging doctors on call. EasyPlot is for scientific plotting and data  analysis.  [email or web address]  and use [place]'s universal password to access. If you do not have the password, please contact the hospital operator.  3. Locate the [facility] provider you are looking for under [facility] and page to a number that you can be directly reached. 4. If you still have difficulty reaching the provider, please page the [facility] (Director on Call) for the Hospitalists listed on amion for assistance.  06/04/2020, 9:30 PM

## 2020-06-04 NOTE — Progress Notes (Signed)
  Pharmacy Antibiotic Note  Toni [name] is a 74 y.o. female admitted on 06/04/2020 with sepsis.  Pharmacy has been consulted for vancomycin and cefepime dosing. Pt is also receiving flagyl per primary team. This is day 1 of therapy. Pt is afebrile, WBC 11.2, LA 1.0. Pt is presenting with AMS, drowsiness, and malaise. 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    Plan: Vancomycin 1750mg  IV x1 load Vancomycin 1000mg  IV q12h Cefepime 2g IV q8h  Flagyl per primary Monitor cbc, clinical progress, micro data, and renal function      Temp (24hrs), Avg:98.9 F (37.2 C), Min:97.6 F (36.4 C), Max:100 F (37.8 C)  Recent Labs  Lab 06/04/20 0052 06/04/20 0317  WBC 11.2*  --   CREATININE 0.76  --   LATICACIDVEN 0.9 1.0    Estimated Creatinine Clearance: 64.7 mL/min (by C-G formula based on SCr of 0.76 mg/dL).    Allergies  Allergen Reactions  . Chlorhexidine Hives and Other (See Comments)    And, sores appear where applied  . Hydrocodone Nausea And Vomiting and Other (See Comments)    Hydrocodone causes vomiting  . Oxycodone Nausea And Vomiting and Other (See Comments)    Oral oxycodone causes vomiting  . Chlorhexidine Gluconate Rash and Other (See Comments)    Sores at site applied  . Zoloft [Sertraline] Hives, Swelling and Rash    Antimicrobials this admission: vanc 10/15> Cefepime 10/15> Flagyl x1 10/15   Microbiology results: 10/15 UCx sent 10/15 bcx sent 10/15 resp panel negative 10/15 MRSA PCR ordered  Thank you for allowing pharmacy to be a part of this patient's care.  Carolin Guernsey  PGY1 pharmacy resident 06/04/2020 6:12 PM

## 2020-06-04 NOTE — ED Provider Notes (Signed)
[name] Provider Note   CSN: [id number] Arrival date & time:        History Chief Complaint  Patient presents with  . Afib / Fever ( UTI)    ELEASE SWARM is a 74 y.o. female.  Patient comes to the emergency department by ambulance from home.  She started to have heart palpitations and noted a racing heartbeat.  She has a history of permanent atrial fibrillation.  Patient found to have a low-grade fever prior to arrival.  Patient reports that she has a history of recurrent urinary tract infection secondary to urinary retention from MS.  She says she has been having an infection for about a month, has been on multiple antibiotics.        Past Medical History:  Diagnosis Date  . Abnormality of gait 03/01/2015  . Anxiety   . Arthritis    "knees" (01/19/2016)  . Asthma    as a child   . Back pain   . Cancer ([place])    / RENAL CELL CARCINOMA - GETS CT SCAN EVERY 6 MONTHS TO WATCH   . CHF (congestive heart failure) ([place])   . Chronic pain    "nerve pain from the MS"  . Complex atypical endometrial hyperplasia   . Constipation   . Depression   . Diabetes mellitus without complication ([place])   . DVT (deep venous thrombosis) ([place]) 1983   "RLE; may have just been phlebitis; it was before the age of dopplers"  . Dysrhythmia    afib   . 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 GERD (gastroesophageal reflux disease)   . HLD (hyperlipidemia)   . Hypertension   . Hypotension   . Hypothyroidism   . Joint pain   . Multiple sclerosis ([place])    has had this may 1989-[facility] reg doc  . OSA on CPAP    bipap machine - SLEEP APNEA WORSE SINCE COVID IN 1/21 PER PATIENT SETTING HAS INCREASED FROM 9 TO 30   . Osteoarthritis   . PAF (paroxysmal atrial fibrillation) ([place])    on coumadin; documented on monitor 06/2010  . Pneumonia 11/2011  . Sleep apnea     Patient Active Problem List    Diagnosis Date Noted  . Atrial fibrillation with RVR ([place]) 04/07/2020  . UTI (urinary tract infection) 04/07/2020  . 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 Oxygen dependent 02/26/2020  . Neurogenic hypoventilation 02/26/2020  . Complex atypical endometrial hyperplasia   . COVID-19 virus infection 08/23/2019  . Bilateral leg weakness 08/04/2019  . Weakness 08/04/2019  . Unilateral primary osteoarthritis, left knee 01/09/2019  . Type 2 diabetes mellitus without complication, without long-term current use of insulin ([place]) 08/27/2018  . Obstructive sleep apnea treated with bilevel positive airway pressure (BiPAP) 08/15/2018  . 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 ([place]) 11/21/2017  . Permanent atrial fibrillation ([place]) 11/21/2017  . Tightness of leg fascia 11/21/2017  . Muscle spasm of both lower legs 11/21/2017  . Carpal tunnel syndrome, left upper limb 09/13/2017  . Carpal tunnel syndrome, right upper limb 09/13/2017  . Other insomnia 06/11/2017  . Depression 06/11/2017  . Vitamin D deficiency 03/22/2017  . Right hand weakness 08/06/2016  . Nonischemic cardiomyopathy ([place]) 05/09/2016  . Localized edema 05/09/2016  . Long term current use of anticoagulant therapy 02/03/2016  . Amnestic MCI (mild cognitive impairment with memory loss)  03/30/2015  . Obesity hypoventilation syndrome ([place]) 03/30/2015  . Abnormality of gait 03/01/2015  . Chronic constipation with overflow incontinence 01/25/2015  . 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) ([place]) 01/25/2015  . 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 Varicose veins of both lower extremities with complications 54/27/0623  . Long term (current) use of anticoagulants 11/05/2012  . 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 Essential hypertension   . Thyroid disease   . HLD (hyperlipidemia)      Past Surgical History:  Procedure Laterality Date  . ANTERIOR CERVICAL DECOMP/DISCECTOMY FUSION  01/2004  . CARDIAC CATHETERIZATION  06/22/2010   normal coronary arteries, PAF  . CATARACT EXTRACTION, BILATERAL Bilateral   . CHILD BIRTHS     X2  . DILATATION & CURETTAGE/HYSTEROSCOPY WITH MYOSURE N/A 01/15/2020   Procedure: DILATATION & CURETTAGE/HYSTEROSCOPY WITH MYOSURE;  Surgeon: Arvella Nigh, MD;  Location: [facility];  Service: Gynecology;  Laterality: N/A;  pt in wheelchair-has MS  . DILATION AND CURETTAGE OF UTERUS    . DILATION AND CURETTAGE OF UTERUS N/A 02/16/2020   Procedure: DILATATION AND CURETTAGE;  Surgeon: Lafonda Mosses, MD;  Location: [facility];  Service: Gynecology;  Laterality: N/A;  NOT GENERAL -NO INTUBATION  . INFUSION PUMP IMPLANTATION  ~ 2009   baclofen infusion in lower abd  . INTRAUTERINE DEVICE (IUD) INSERTION N/A 02/16/2020   Procedure: INTRAUTERINE DEVICE (IUD) INSERTION - MIRENA;  Surgeon: Lafonda Mosses, MD;  Location: [facility];  Service: Gynecology;  Laterality: N/A;  . PAIN PUMP REVISION N/A 07/29/2014   Procedure: Baclofen pump replacement;  Surgeon: Erline Levine, MD;  Location: [facility];  Service: Neurosurgery;  Laterality: N/A;  Baclofen pump replacement  . TONSILLECTOMY AND ADENOIDECTOMY    . VARICOSE VEIN SURGERY  ~ 1968  . VENOUS ABLATION  12/16/2010   radiofreq ablation -Dr Elisabeth Cara and [facility]  . WISDOM TOOTH EXTRACTION       OB History    Gravida  2   Para      Term      Preterm      AB      Living        SAB      TAB      Ectopic      Multiple      Live Births              Family History  Problem Relation Age of Onset  . Coronary artery disease Father        at age 2  . Hyperlipidemia Father   . Thyroid disease Father   . Coronary artery disease Maternal Grandmother   . Depression Maternal Grandmother   . Cancer Paternal Grandmother        breast  . Depression Mother   . Sudden death Mother   . Anxiety  disorder Mother   . Alcoholism Son     Social History   Tobacco Use  . Smoking status: Former Smoker    Packs/day: 0.50    Years: 7.00    Pack years: 3.50    Types: Cigarettes    Quit date: 08/21/1976    Years since quitting: 43.8  . Smokeless tobacco: Never Used  Vaping Use  . Vaping Use: Never used  Substance Use Topics  . Alcohol use: Not Currently  . Drug use: No    Home Medications Prior to Admission medications  Medication Sig Start Date End Date Taking? Authorizing Provider  acetaminophen (TYLENOL) 500 MG tablet Take 500-1,000 mg by mouth every 6 (six) hours as needed for fever or headache (or pain).    [provider]  ALPRAZolam Duanne Moron) 0.5 MG tablet Take 1 tablet (0.5 mg total) by mouth 3 (three) times daily as needed for anxiety or sleep. 02/26/20   Dohmeier, Asencion Partridge, MD  baclofen (LIORESAL) 20 MG tablet Take 1 tablet (20 mg total) by mouth 2 (two) times daily as needed (if the pump needs a boost). 04/15/20   Dohmeier, Asencion Partridge, MD  BIOTIN PO Take 1 tablet by mouth daily.    [provider]  buPROPion (WELLBUTRIN XL) 300 MG 24 hr tablet Take 1 tablet (300 mg total) by mouth daily. 08/23/17   Dennard Nip D, MD  Calcium Carb-Cholecalciferol (CALCIUM + D3 PO) Take 2 tablets by mouth in the morning and at bedtime.    [provider]  Calcium Carbonate-Vit D-Min (CALTRATE 600+D PLUS MINERALS) 600-800 MG-UNIT TABS Take 2 tablets by mouth in the morning and at bedtime.     [provider]  cetirizine (ZYRTEC) 10 MG tablet Take 10 mg by mouth daily as needed for allergies.     [provider]  Cholecalciferol (VITAMIN D3) 50 MCG (2000 UT) TABS Take 2,000 Units by mouth in the morning and at bedtime.    [provider]  Coenzyme Q10 (CO Q-10 PO) Take 1 capsule by mouth daily.     [provider]  corticotropin (ACTHAR) 80 UNIT/ML injectable gel Inject 1 mL (80 Units total) into the muscle every other day. For 10 days each  month. Patient taking differently: Inject 80 Units into the skin See admin instructions. Inject into the skin as directed every other day for a period of 10 days each month 12/01/19   Dohmeier, Asencion Partridge, MD  diltiazem (CARDIZEM CD) 240 MG 24 hr capsule Take 1 capsule (240 mg total) by mouth daily. Patient taking differently: Take 240 mg by mouth in the morning.  01/28/20   Hilty, Nadean Corwin, MD  diltiazem (CARDIZEM) 60 MG tablet Take 1 tablet (60 mg total) by mouth every 6 (six) hours as needed for up to 7 days (HR sustained >110bpm). 10/17/19 04/07/20  Terrilee Croak, MD  docusate sodium (COLACE) 100 MG capsule Take 1 capsule (100 mg total) by mouth daily. Patient taking differently: Take 200 mg by mouth at bedtime.  08/12/16   Rai, Vernelle Emerald, MD  famotidine (PEPCID) 20 MG tablet Take 20 mg by mouth at bedtime.  06/11/18   [provider]  fluticasone (FLONASE) 50 MCG/ACT nasal spray Place 2 sprays into both nostrils daily as needed for allergies.  03/23/15   [provider]  furosemide (LASIX) 20 MG tablet Take 1 tablet by mouth daily, may take second dose if needed for excess edema Patient taking differently: Take 20 mg by mouth in the morning and at bedtime.  09/30/19   Hilty, Nadean Corwin, MD  gabapentin (NEURONTIN) 300 MG capsule TAKE 1 CAPSULE BY MOUTH UP  TO 4 TIMES DAILY Patient taking differently: Take 300 mg by mouth every 6 (six) hours.  03/02/20   Dohmeier, Asencion Partridge, MD  lactulose, encephalopathy, (GENERLAC) 10 GM/15ML SOLN TAKE 15 ML BY MOUTH TWICE DAILY AS NEEDED Patient taking differently: Take 10 g by mouth See admin instructions. Take 10 grams by mouth once a day and an additional 10 grams once daily, as needed to move the bowels 03/25/20  Dohmeier, Asencion Partridge, MD  levothyroxine (SYNTHROID, LEVOTHROID) 75 MCG tablet Take 75 mcg by mouth daily before breakfast.     [provider]  linaclotide (LINZESS) 145 MCG CAPS capsule Take 1 capsule (145 mcg total) by mouth daily before  breakfast. 04/20/20   Thornton Park, MD  melatonin 5 MG TABS Take 5 mg by mouth at bedtime as needed (sleep).     [provider]  midodrine (PROAMATINE) 5 MG tablet Take 1 tablet (5 mg total) by mouth 3 (three) times daily with meals. Patient taking differently: Take 5 mg by mouth See admin instructions. Take 5 mg by mouth up to three times a day with food, as needed to raise the blood pressure 09/30/19   Hilty, Nadean Corwin, MD  Omega-3 Fatty Acids (FISH OIL PO) Take 1 capsule by mouth daily.    [provider]  omeprazole (PRILOSEC) 20 MG capsule Take 20 mg by mouth daily before breakfast.     [provider]  ondansetron (ZOFRAN) 8 MG tablet Take 8 mg by mouth See admin instructions. Take 8 mg by mouth in the morning and an additional 8 mg twice a day as needed for nausea 01/12/20   [provider]  oxybutynin (DITROPAN) 5 MG tablet Take 5 mg by mouth 3 (three) times daily as needed for bladder spasms.  02/09/20   [provider]  psyllium (METAMUCIL SMOOTH TEXTURE) 28 % packet Take 1 packet by mouth every evening.     [provider]  simvastatin (ZOCOR) 10 MG tablet Take 10 mg by mouth every evening.  03/01/18   [provider]  sulfamethoxazole-trimethoprim (BACTRIM) 200-40 MG/5ML suspension Take 150 mg/m2/day by mouth 2 (two) times daily.    [provider]  SYRINGE/NEEDLE, DISP, 1 ML (BD LUER-LOK SYRINGE) 25G X 5/8" 1 ML MISC Use as directed to inject  [place] 06/15/15   Dohmeier, Asencion Partridge, MD  Tamsulosin HCl (FLOMAX) 0.4 MG CAPS Take 0.4 mg by mouth in the morning.     [provider]  warfarin (COUMADIN) 5 MG tablet Take 1-1.5 tablets (5-7.5 mg total) by mouth See admin instructions. Take 5 mg by mouth in the late evening on Sun/Tues/Wed/Thurs/Fri/Sat and 7.5 mg on Mondays 04/09/20   Mariel Aloe, MD    Allergies    Chlorhexidine, Hydrocodone, Oxycodone, Chlorhexidine gluconate, and Zoloft [sertraline]  Review of  Systems   Review of Systems  Constitutional: Positive for fever.  Respiratory: Negative for cough and shortness of breath.   Cardiovascular: Positive for palpitations. Negative for chest pain.  Gastrointestinal: Negative for abdominal pain, nausea and vomiting.  All other systems reviewed and are negative.   Physical Exam Updated Vital Signs BP (!) 111/55   Pulse 70   Temp 98.2 F (36.8 C) (Temporal)   Resp 13   Ht 5\' 3"  (1.6 m)   Wt 85 kg   SpO2 98%   BMI 33.19 kg/m   Physical Exam Vitals and nursing note reviewed.  Constitutional:      General: She is not in acute distress.    Appearance: Normal appearance. She is well-developed.  HENT:     Head: Normocephalic and atraumatic.     Right Ear: Hearing normal.     Left Ear: Hearing normal.     Nose: Nose normal.  Eyes:     Conjunctiva/sclera: Conjunctivae normal.     Pupils: Pupils are equal, round, and reactive to light.  Cardiovascular:     Rate and Rhythm: Tachycardia present.  Rhythm irregularly irregular.     Heart sounds: S1 normal and S2 normal. No murmur heard.  No friction rub. No gallop.   Pulmonary:     Effort: Pulmonary effort is normal. No respiratory distress.     Breath sounds: Normal breath sounds.  Chest:     Chest wall: No tenderness.  Abdominal:     General: Bowel sounds are normal.     Palpations: Abdomen is soft.     Tenderness: There is no abdominal tenderness. There is no guarding or rebound. Negative signs include Murphy's sign and McBurney's sign.     Hernia: No hernia is present.  Musculoskeletal:        General: Normal range of motion.     Cervical back: Normal range of motion and neck supple.  Skin:    General: Skin is warm and dry.     Findings: No rash.  Neurological:     Mental Status: She is alert and oriented to person, place, and time.     GCS: GCS eye subscore is 4. GCS verbal subscore is 5. GCS motor subscore is 6.     Cranial Nerves: No cranial nerve deficit.     Sensory: No  sensory deficit.  Psychiatric:        Speech: Speech normal.        Behavior: Behavior normal.        Thought Content: Thought content normal.     ED Results / Procedures / Treatments   Labs (all labs ordered are listed, but only abnormal results are displayed) Labs Reviewed  COMPREHENSIVE METABOLIC PANEL - Abnormal; Notable for the following components:      Result Value   Glucose, Bld 101 (*)    Calcium 8.6 (*)    Total Protein 5.7 (*)    Albumin 3.1 (*)    All other components within normal limits  CBC WITH DIFFERENTIAL/PLATELET - Abnormal; Notable for the following components:   WBC 11.2 (*)    Monocytes Absolute 1.3 (*)    All other components within normal limits  PROTIME-INR - Abnormal; Notable for the following components:   Prothrombin Time 41.1 (*)    INR 4.5 (*)    All other components within normal limits  APTT - Abnormal; Notable for the following components:   aPTT 40 (*)    All other components within normal limits  URINALYSIS, ROUTINE W REFLEX MICROSCOPIC - Abnormal; Notable for the following components:   Leukocytes,Ua SMALL (*)    All other components within normal limits  RESPIRATORY PANEL BY RT PCR (FLU A&B, COVID)  CULTURE, BLOOD (ROUTINE X 2)  CULTURE, BLOOD (ROUTINE X 2)  URINE CULTURE  LACTIC ACID, PLASMA  LACTIC ACID, PLASMA    EKG EKG Interpretation  Date/Time:  Friday June 04 2020 00:50:07 EDT Ventricular Rate:  160 PR Interval:    QRS Duration: 91 QT Interval:  286 QTC Calculation: 467 R Axis:   160 Text Interpretation: Atrial fibrillation with rapid V-rate Ventricular premature complex Right axis deviation Minimal ST depression, inferior leads Confirmed by Orpah Greek [phone]) on 06/04/2020 1:21:58 AM   Radiology CT ABDOMEN PELVIS W CONTRAST  Result Date: 06/04/2020 CLINICAL DATA:  Fever.  UTI.  Generalized abdominal pain. EXAM: CT ABDOMEN AND PELVIS WITH CONTRAST TECHNIQUE: Multidetector CT imaging of the abdomen and  pelvis was performed using the standard protocol following bolus administration of intravenous contrast. CONTRAST:  145mL OMNIPAQUE IOHEXOL 300 MG/ML  SOLN COMPARISON:  04/06/2020 FINDINGS: Lower chest:  The heart is enlarged. Dependent atelectasis noted bilaterally, right greater than left. Hepatobiliary: No suspicious focal abnormality within the liver parenchyma. There is no evidence for gallstones, gallbladder wall thickening, or pericholecystic fluid. No intrahepatic or extrahepatic biliary dilation. Pancreas: No focal mass lesion. No dilatation of the main duct. No intraparenchymal cyst. No peripancreatic edema. Spleen: No splenomegaly. No focal mass lesion. Adrenals/Urinary Tract: No adrenal nodule or mass. 17 mm lesion upper pole left kidney shows heterogeneous enhancement, as before with no substantial change since 02/25/2019. Tiny hypodensities in the left kidney are similar to prior and remain too small to characterize but likely benign. Mild fullness noted both intrarenal collecting systems without associated hydroureter. Bladder is distended with slight circumferential bladder wall prominence. Stomach/Bowel: Gastric wall thickening likely related to underdistention. No gastric wall thickening. No evidence of outlet obstruction. Duodenum is normally positioned as is the ligament of Treitz. No small bowel wall thickening. No small bowel dilatation. The terminal ileum is normal. The appendix is normal. Large colonic stool volume. Vascular/Lymphatic: No abdominal aortic aneurysm. No abdominal lymphadenopathy. No pelvic sidewall lymphadenopathy. Reproductive: IUD is visualized in the uterus. There is no adnexal mass. Other: No intraperitoneal free fluid. Musculoskeletal: Mild body wall edema evident. Bones are demineralized. Right-sided spinal device again noted, incompletely visualized. IMPRESSION: 1. No acute findings in the abdomen or pelvis. No findings to explain the patient's history of generalized  abdominal pain. 2. Similar appearance heterogeneously enhancing 17 mm lesion upper pole left kidney. Renal cell carcinoma a concern. 3. Prominent colonic stool volume. Imaging features could be compatible with clinical constipation. 4. Prominent gastric wall likely related to underdistention. Electronically Signed   By: Misty Stanley M.D.   On: 06/04/2020 06:33   DG Chest Port 1 View  Result Date: 06/04/2020 CLINICAL DATA:  Sepsis EXAM: PORTABLE CHEST 1 VIEW COMPARISON:  10/14/2019 FINDINGS: Lungs are well expanded, symmetric, and clear. No pneumothorax or pleural effusion. Cardiac size within normal limits. Pulmonary vascularity is normal. Osseous structures are age-appropriate. No acute bone abnormality. IMPRESSION: No active disease. Electronically Signed   By: Fidela Salisbury MD   On: 06/04/2020 01:46    Procedures Procedures (including critical care time)  Medications Ordered in ED Medications  lactated ringers infusion ( Intravenous New Bag/Given 06/04/20 0108)  ceFEPIme (MAXIPIME) 2 g in sodium chloride 0.9 % 100 mL IVPB (0 g Intravenous Stopped 06/04/20 0133)  acetaminophen (TYLENOL) tablet 650 mg (650 mg Oral Given 06/04/20 0110)  iohexol (OMNIPAQUE) 300 MG/ML solution 100 mL (100 mLs Intravenous Contrast Given 06/04/20 0540)    ED Course  I have reviewed the triage vital signs and the nursing notes.  Pertinent labs & imaging results that were available during my care of the patient were reviewed by me and considered in my medical decision making (see chart for details).    MDM Rules/Calculators/A&P                          Patient presented with heart palpitations.  She has a history of permanent atrial fibrillation.  She is anticoagulated.  INR is supratherapeutic.  She was tachycardic at arrival.  She had a low-grade fever at home, 99.9 here in the ER.  Patient reports that she is currently being treated for a UTI and has had recurrent urinary tract infection secondary to her  MS.  Her work-up, however, has been unremarkable.  White count is 11.2.  Comprehensive panel is normal.  Lactic acids are normal.  Urinalysis is normal, no concern for ongoing infection.  Covid is negative.  Chest x-ray does not show any pneumonia.  She has not had URI symptoms.  Urinalysis does not show any signs of infection.  Culture is pending.  Patient reevaluated.  Heart rate is now in the 70s and 80s, well controlled.  She reports that she feels much improvement.  She does, now, tell me that she has been experiencing some sensation of feeling constipated and some left lower quadrant pains.  Examination does revealed mild tenderness without guarding or rebound in the left lower quadrant.  CT scan to evaluate for diverticulitis performed.  Scan did not show any acute abnormality.  Patient has had extensive work-up and there were no abnormal findings.  Not clear if she had a fever prior to arrival in the hospital, temperature here was 99.9, not quite a fever.  She was tachycardic, however.  This has resolved without treatment other than Tylenol.  At this point I do not feel that the patient requires hospitalization.  No sign of sepsis or any findings consistent with bacterial infection.  As her heart rate has resolved and she is chronically anticoagulated, will discharge.  Blood and urine cultures are pending and can be followed.  Follow-up with PCP as soon as possible.  Return to the ER for any worsening symptoms.  CHA2DS2/VAS Stroke Risk Points  Current as of 56 minutes ago     7 >= 2 Points: High Risk  1 - 1.99 Points: Medium Risk  0 Points: Low Risk    No Change      Details    This score determines the patient's risk of having a stroke if the  patient has atrial fibrillation.       Points Metrics  1 Has Congestive Heart Failure:  Yes    Current as of 56 minutes ago  0 Has Vascular Disease:  No    Current as of 56 minutes ago  1 Has Hypertension:  Yes    Current as of 56 minutes ago   1 Age:  31    Current as of 56 minutes ago  1 Has Diabetes:  Yes    Current as of 56 minutes ago  2 Had Stroke:  No  Had TIA:  No  Had thromboembolism:  Yes     Current as of 56 minutes ago  1 Female:  Yes    Current as of 56 minutes ago           Final Clinical Impression(s) / ED Diagnoses Final diagnoses:  Atrial fibrillation with RVR ([place])    Rx / DC Orders ED Discharge Orders    None       Orpah Greek, MD 06/04/20 [phone]

## 2020-06-04 NOTE — ED Provider Notes (Signed)
[facility] EMERGENCY DEPARTMENT Provider Note   CSN: [id number] Arrival date & time: 06/04/20  1720     History Chief Complaint  Patient presents with  . Fever  . Altered Mental Status    NYEMA HACHEY is a 74 y.o. female.  74 year old female with history of a fib (on coumadin), CHF, HLD, HTN, brought in by EMS for generalized weakness. Patient was seen in this ER early this morning for palpitations, concern for possible UTI which she has frequently. Patient was given Tylenol (max temp 99.9) and was discharged. Patient left the ER around 7AM today, states she went home and took a nap and when she woke up her friends told her she was "talking out of (her) head" and called 911. At this time, patient reports feeling generally weak. Patient last took Tylenol around 4:30PM, temp on arrival of 100 oral. Initial BP low but improved on adjustment of BP cuff. Patient denies shortness of breath, chest pain, abdominal pain, nausea, vomiting, changes in bowel or bladder habits. No other complaints or concerns.   BERNARDINE LANGWORTHY was evaluated in Emergency Department on 06/04/2020 for the symptoms described in the history of present illness. She was evaluated in the context of the global COVID-19 pandemic, which necessitated consideration that the patient might be at risk for infection with the SARS-CoV-2 virus that causes COVID-19. Institutional protocols and algorithms that pertain to the evaluation of patients at risk for COVID-19 are in a state of rapid change based on information released by regulatory bodies including the CDC and federal and state organizations. These policies and algorithms were followed during the patient's care in the ED.         Past Medical History:  Diagnosis Date  . Abnormality of gait 03/01/2015  . Anxiety   . Arthritis    "knees" (01/19/2016)  . Asthma    as a child   . Back pain   . Cancer ([facility])    / RENAL CELL CARCINOMA - GETS CT SCAN  EVERY 6 MONTHS TO WATCH   . CHF (congestive heart failure) ([place])   . Chronic pain    "nerve pain from the MS"  . Complex atypical endometrial hyperplasia   . Constipation   . Depression   . Diabetes mellitus without complication ([place])   . DVT (deep venous thrombosis) ([place]) 1983   "RLE; may have just been phlebitis; it was before the age of dopplers"  . Dysrhythmia    afib   . 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 GERD (gastroesophageal reflux disease)   . HLD (hyperlipidemia)   . Hypertension   . Hypotension   . Hypothyroidism   . Joint pain   . Multiple sclerosis ([place])    has had this may 1989-[facility] reg doc  . OSA on CPAP    bipap machine - SLEEP APNEA WORSE SINCE COVID IN 1/21 PER PATIENT SETTING HAS INCREASED FROM 9 TO 30   . Osteoarthritis   . PAF (paroxysmal atrial fibrillation) ([place])    on coumadin; documented on monitor 06/2010  . Pneumonia 11/2011  . Sleep apnea     Patient Active Problem List   Diagnosis Date Noted  . Sepsis ([place]) 06/04/2020  . Acute metabolic encephalopathy 62/83/1517  . Atrial fibrillation with RVR ([place]) 04/07/2020  . UTI (urinary tract infection) 04/07/2020  . 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  Oxygen dependent 02/26/2020  . Neurogenic hypoventilation 02/26/2020  . Complex atypical endometrial hyperplasia   . Acute on chronic respiratory failure with hypoxia ([place]) 10/10/2019  . COVID-19 virus infection 08/23/2019  . Bilateral leg weakness 08/04/2019  . Weakness 08/04/2019  . Unilateral primary osteoarthritis, left knee 01/09/2019  . Type 2 diabetes mellitus without complication, without long-term current use of insulin ([place]) 08/27/2018  . Obstructive sleep apnea treated with bilevel positive airway pressure (BiPAP) 08/15/2018  . 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 ([place]) 11/21/2017  . Permanent atrial fibrillation ([place]) 11/21/2017  . Tightness of leg fascia  11/21/2017  . Muscle spasm of both lower legs 11/21/2017  . Carpal tunnel syndrome, left upper limb 09/13/2017  . Carpal tunnel syndrome, right upper limb 09/13/2017  . Other insomnia 06/11/2017  . Depression 06/11/2017  . Vitamin D deficiency 03/22/2017  . Right hand weakness 08/06/2016  . Nonischemic cardiomyopathy ([place]) 05/09/2016  . Localized edema 05/09/2016  . Long term current use of anticoagulant therapy 02/03/2016  . 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 Obesity hypoventilation syndrome ([place]) 03/30/2015  . Abnormality of gait 03/01/2015  . Chronic constipation with overflow incontinence 01/25/2015  . 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) ([place]) 01/25/2015  . 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 Varicose veins of both lower extremities with complications 48/08/6551  . Long term (current) use of anticoagulants 11/05/2012  . 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 Essential hypertension   . Thyroid disease   . HLD (hyperlipidemia)     Past Surgical History:  Procedure Laterality Date  . ANTERIOR CERVICAL DECOMP/DISCECTOMY FUSION  01/2004  . CARDIAC CATHETERIZATION  06/22/2010   normal coronary arteries, PAF  . CATARACT EXTRACTION, BILATERAL Bilateral   . CHILD BIRTHS     X2  . DILATATION & CURETTAGE/HYSTEROSCOPY WITH MYOSURE N/A 01/15/2020   Procedure: DILATATION & CURETTAGE/HYSTEROSCOPY WITH MYOSURE;  Surgeon: Arvella Nigh, MD;  Location: [facility];  Service: Gynecology;  Laterality: N/A;  pt in wheelchair-has MS  . DILATION AND CURETTAGE OF UTERUS    . DILATION AND CURETTAGE OF UTERUS N/A 02/16/2020   Procedure: DILATATION AND CURETTAGE;  Surgeon: Lafonda Mosses, MD;  Location: [facility];  Service: Gynecology;  Laterality: N/A;  NOT GENERAL -NO INTUBATION  . INFUSION PUMP IMPLANTATION  ~ 2009   baclofen  infusion in lower abd  . INTRAUTERINE DEVICE (IUD) INSERTION N/A 02/16/2020   Procedure: INTRAUTERINE DEVICE (IUD) INSERTION - MIRENA;  Surgeon: Lafonda Mosses, MD;  Location: [facility];  Service: Gynecology;  Laterality: N/A;  . PAIN PUMP REVISION N/A 07/29/2014   Procedure: Baclofen pump replacement;  Surgeon: Erline Levine, MD;  Location: [facility];  Service: Neurosurgery;  Laterality: N/A;  Baclofen pump replacement  . TONSILLECTOMY AND ADENOIDECTOMY    . VARICOSE VEIN SURGERY  ~ 1968  . VENOUS ABLATION  12/16/2010   radiofreq ablation -Dr Elisabeth Cara and [facility]  . WISDOM TOOTH EXTRACTION       OB History    Gravida  2   Para      Term      Preterm      AB      Living        SAB      TAB      Ectopic      Multiple      Live Births  Family History  Problem Relation Age of Onset  . Coronary artery disease Father        at age 9  . Hyperlipidemia Father   . Thyroid disease Father   . Coronary artery disease Maternal Grandmother   . Depression Maternal Grandmother   . Cancer Paternal Grandmother        breast  . Depression Mother   . Sudden death Mother   . Anxiety disorder Mother   . Alcoholism Son     Social History   Tobacco Use  . Smoking status: Former Smoker    Packs/day: 0.50    Years: 7.00    Pack years: 3.50    Types: Cigarettes    Quit date: 08/21/1976    Years since quitting: 43.8  . Smokeless tobacco: Never Used  Vaping Use  . Vaping Use: Never used  Substance Use Topics  . Alcohol use: Not Currently  . Drug use: No    Home Medications Prior to Admission medications   Medication Sig Start Date End Date Taking? Authorizing Provider  acetaminophen (TYLENOL) 500 MG tablet Take 500-1,000 mg by mouth every 6 (six) hours as needed for fever or headache (or pain).   Yes [provider]  ALPRAZolam (XANAX) 0.5 MG tablet Take 1 tablet (0.5 mg total) by mouth 3 (three) times daily as needed for anxiety or sleep. 02/26/20   Yes Dohmeier, Asencion Partridge, MD  BIOTIN PO Take 1 tablet by mouth daily.   Yes [provider]  buPROPion (WELLBUTRIN XL) 300 MG 24 hr tablet Take 1 tablet (300 mg total) by mouth daily. 08/23/17  Yes Beasley, Caren D, MD  Calcium Carb-Cholecalciferol (CALCIUM + D3 PO) Take 2 tablets by mouth in the morning and at bedtime.   Yes [provider]  Cholecalciferol (VITAMIN D3) 50 MCG (2000 UT) TABS Take 2,000 Units by mouth in the morning and at bedtime.   Yes [provider]  Coenzyme Q10 (CO Q-10 PO) Take 1 capsule by mouth daily.    Yes [provider]  corticotropin (ACTHAR) 80 UNIT/ML injectable gel Inject 1 mL (80 Units total) into the muscle every other day. For 10 days each month. Patient taking differently: Inject 80 Units into the skin See admin instructions. Inject 80 units into the skin as directed every other day for a period of 10 days each month 12/01/19  Yes Dohmeier, Asencion Partridge, MD  diltiazem (CARDIZEM CD) 240 MG 24 hr capsule Take 1 capsule (240 mg total) by mouth daily. Patient taking differently: Take 240 mg by mouth in the morning.  01/28/20  Yes Hilty, Nadean Corwin, MD  famotidine (PEPCID) 20 MG tablet Take 20 mg by mouth at bedtime.  06/11/18  Yes [provider]  gabapentin (NEURONTIN) 300 MG capsule TAKE 1 CAPSULE BY MOUTH UP  TO 4 TIMES DAILY Patient taking differently: Take 300 mg by mouth every 6 (six) hours.  03/02/20  Yes Dohmeier, Asencion Partridge, MD  levothyroxine (SYNTHROID, LEVOTHROID) 75 MCG tablet Take 75 mcg by mouth daily before breakfast.    Yes [provider]  linaclotide (LINZESS) 145 MCG CAPS capsule Take 1 capsule (145 mcg total) by mouth daily before breakfast. 04/20/20  Yes Thornton Park, MD  melatonin 5 MG TABS Take 5 mg by mouth at bedtime.    Yes [provider]  midodrine (PROAMATINE) 5 MG tablet Take 1 tablet (5 mg total) by mouth 3 (three) times daily with meals. Patient taking differently: Take 5 mg by mouth 2  (  two) times daily as needed (for blood pressure).  09/30/19  Yes Hilty, Nadean Corwin, MD  Omega-3 Fatty Acids (FISH OIL PO) Take 1 capsule by mouth daily.   Yes [provider]  omeprazole (PRILOSEC) 20 MG capsule Take 20 mg by mouth daily before breakfast.    Yes [provider]  ondansetron (ZOFRAN) 8 MG tablet Take 8 mg by mouth every morning.  01/12/20  Yes [provider]  oxybutynin (DITROPAN) 5 MG tablet Take 5 mg by mouth 3 (three) times daily as needed for bladder spasms.  02/09/20  Yes [provider]  simvastatin (ZOCOR) 10 MG tablet Take 10 mg by mouth every evening.  03/01/18  Yes [provider]  Tamsulosin HCl (FLOMAX) 0.4 MG CAPS Take 0.4 mg by mouth in the morning.    Yes [provider]  warfarin (COUMADIN) 5 MG tablet Take 1-1.5 tablets (5-7.5 mg total) by mouth See admin instructions. Take 5 mg by mouth in the late evening on Sun/Tues/Wed/Thurs/Fri/Sat and 7.5 mg on Mondays Patient taking differently: Take 2.5-5 mg by mouth See admin instructions. Take 1 tablet (5 mg totally) by mouth in the late evening on Sun/Tues/Wed/Thurs/Sat. Except 0.5 tablet ( 2.5 mg totally) by mouth on Monday, Friday 04/09/20  Yes Mariel Aloe, MD  baclofen (LIORESAL) 20 MG tablet Take 1 tablet (20 mg total) by mouth 2 (two) times daily as needed (if the pump needs a boost). 04/15/20   Dohmeier, Asencion Partridge, MD  cephALEXin (KEFLEX) 500 MG capsule Take 500 mg by mouth every 8 (eight) hours. 05/26/20   [provider]  cetirizine (ZYRTEC) 10 MG tablet Take 10 mg by mouth daily as needed for allergies.     [provider]  diltiazem (CARDIZEM) 60 MG tablet Take 1 tablet (60 mg total) by mouth every 6 (six) hours as needed for up to 7 days (HR sustained >110bpm). 10/17/19 04/07/20  Terrilee Croak, MD  docusate sodium (COLACE) 100 MG capsule Take 1 capsule (100 mg total) by mouth daily. Patient taking differently: Take 200 mg by mouth at bedtime.  08/12/16    Rai, Vernelle Emerald, MD  fluticasone (FLONASE) 50 MCG/ACT nasal spray Place 2 sprays into both nostrils daily as needed for allergies.  03/23/15   [provider]  furosemide (LASIX) 20 MG tablet Take 1 tablet by mouth daily, may take second dose if needed for excess edema Patient taking differently: Take 20 mg by mouth in the morning and at bedtime.  09/30/19   Hilty, Nadean Corwin, MD  lactulose, encephalopathy, (GENERLAC) 10 GM/15ML SOLN TAKE 15 ML BY MOUTH TWICE DAILY AS NEEDED Patient taking differently: Take 10 g by mouth See admin instructions. Take 10 grams by mouth once a day and an additional 10 grams once daily, as needed to move the bowels 03/25/20   Dohmeier, Asencion Partridge, MD  nitrofurantoin (MACRODANTIN) 100 MG capsule Take 100 mg by mouth at bedtime. 04/20/20   [provider]  psyllium (METAMUCIL SMOOTH TEXTURE) 28 % packet Take 1 packet by mouth every evening.     [provider]  SYRINGE/NEEDLE, DISP, 1 ML (BD LUER-LOK SYRINGE) 25G X 5/8" 1 ML MISC Use as directed to inject  [place] 06/15/15   Dohmeier, Asencion Partridge, MD    Allergies    Chlorhexidine, Hydrocodone, Oxycodone, Chlorhexidine gluconate, and Zoloft [sertraline]  Review of Systems   Review of Systems  Constitutional: Positive for fever. Negative for chills and diaphoresis.  HENT: Negative for congestion.   Respiratory: Negative  for cough and shortness of breath.   Cardiovascular: Negative for chest pain and leg swelling.  Gastrointestinal: Negative for abdominal pain.  Genitourinary: Negative for dysuria and frequency.  Musculoskeletal: Negative for arthralgias and myalgias.  Skin: Negative for rash and wound.  Allergic/Immunologic: Negative for immunocompromised state.  Neurological: Positive for weakness.  Hematological: Negative for adenopathy.  Psychiatric/Behavioral: Negative for confusion.  All other systems reviewed and are negative.   Physical Exam Updated Vital Signs BP (!) 104/47   Pulse 93    Temp (!) 101.3 F (38.5 C) (Rectal)   Resp 16   SpO2 90%   Physical Exam Vitals and nursing note reviewed.  Constitutional:      General: She is not in acute distress.    Appearance: She is well-developed. She is obese. She is not diaphoretic.  HENT:     Head: Normocephalic and atraumatic.  Eyes:     Conjunctiva/sclera: Conjunctivae normal.  Cardiovascular:     Rate and Rhythm: Tachycardia present. Rhythm irregular.     Pulses: Normal pulses.     Heart sounds: Normal heart sounds.  Pulmonary:     Effort: Pulmonary effort is normal.     Breath sounds: Normal breath sounds.  Chest:     Chest wall: No tenderness.  Abdominal:     Palpations: Abdomen is soft.     Tenderness: There is no abdominal tenderness.  Musculoskeletal:     Cervical back: Neck supple.  Skin:    General: Skin is warm and dry.     Findings: No erythema or rash.  Neurological:     Mental Status: She is alert and oriented to person, place, and time.     Sensory: No sensory deficit.     Motor: No weakness.  Psychiatric:        Behavior: Behavior normal.     ED Results / Procedures / Treatments   Labs (all labs ordered are listed, but only abnormal results are displayed) Labs Reviewed  PROTIME-INR - Abnormal; Notable for the following components:      Result Value   Prothrombin Time 27.7 (*)    INR 2.7 (*)    All other components within normal limits  CBC WITH DIFFERENTIAL/PLATELET - Abnormal; Notable for the following components:   RBC 3.77 (*)    Hemoglobin 11.8 (*)    Platelets 142 (*)    Monocytes Absolute 1.2 (*)    All other components within normal limits  COMPREHENSIVE METABOLIC PANEL - Abnormal; Notable for the following components:   Creatinine, Ser 0.39 (*)    Calcium 8.4 (*)    Total Protein 5.1 (*)    Albumin 2.7 (*)    AST 13 (*)    All other components within normal limits  APTT - Abnormal; Notable for the following components:   aPTT 43 (*)    All other components within  normal limits  MRSA PCR SCREENING  LACTIC ACID, PLASMA  AMMONIA  LACTIC ACID, PLASMA  PROTIME-INR  CORTISOL-AM, BLOOD  PROCALCITONIN  COMPREHENSIVE METABOLIC PANEL  CBC    EKG None  Radiology CT ABDOMEN PELVIS W CONTRAST  Result Date: 06/04/2020 CLINICAL DATA:  Fever.  UTI.  Generalized abdominal pain. EXAM: CT ABDOMEN AND PELVIS WITH CONTRAST TECHNIQUE: Multidetector CT imaging of the abdomen and pelvis was performed using the standard protocol following bolus administration of intravenous contrast. CONTRAST:  163mL OMNIPAQUE IOHEXOL 300 MG/ML  SOLN COMPARISON:  04/06/2020 FINDINGS: Lower chest: The heart is enlarged. Dependent atelectasis noted bilaterally,  right greater than left. Hepatobiliary: No suspicious focal abnormality within the liver parenchyma. There is no evidence for gallstones, gallbladder wall thickening, or pericholecystic fluid. No intrahepatic or extrahepatic biliary dilation. Pancreas: No focal mass lesion. No dilatation of the main duct. No intraparenchymal cyst. No peripancreatic edema. Spleen: No splenomegaly. No focal mass lesion. Adrenals/Urinary Tract: No adrenal nodule or mass. 17 mm lesion upper pole left kidney shows heterogeneous enhancement, as before with no substantial change since 02/25/2019. Tiny hypodensities in the left kidney are similar to prior and remain too small to characterize but likely benign. Mild fullness noted both intrarenal collecting systems without associated hydroureter. Bladder is distended with slight circumferential bladder wall prominence. Stomach/Bowel: Gastric wall thickening likely related to underdistention. No gastric wall thickening. No evidence of outlet obstruction. Duodenum is normally positioned as is the ligament of Treitz. No small bowel wall thickening. No small bowel dilatation. The terminal ileum is normal. The appendix is normal. Large colonic stool volume. Vascular/Lymphatic: No abdominal aortic aneurysm. No abdominal  lymphadenopathy. No pelvic sidewall lymphadenopathy. Reproductive: IUD is visualized in the uterus. There is no adnexal mass. Other: No intraperitoneal free fluid. Musculoskeletal: Mild body wall edema evident. Bones are demineralized. Right-sided spinal device again noted, incompletely visualized. IMPRESSION: 1. No acute findings in the abdomen or pelvis. No findings to explain the patient's history of generalized abdominal pain. 2. Similar appearance heterogeneously enhancing 17 mm lesion upper pole left kidney. Renal cell carcinoma a concern. 3. Prominent colonic stool volume. Imaging features could be compatible with clinical constipation. 4. Prominent gastric wall likely related to underdistention. Electronically Signed   By: Misty Stanley M.D.   On: 06/04/2020 06:33   DG Chest Port 1 View  Result Date: 06/04/2020 CLINICAL DATA:  74 year old female with shortness of breath and fever. EXAM: PORTABLE CHEST 1 VIEW COMPARISON:  Chest CT dated 07/08/2017. chest radiograph dated 06/04/2020. FINDINGS: There are bibasilar interstitial coarsening or atelectasis. No focal consolidation, pleural effusion or pneumothorax. Stable cardiomegaly. Atherosclerotic calcification of the aorta. No acute osseous pathology. Cervical ACDF. IMPRESSION: No active disease. Electronically Signed   By: Anner Crete M.D.   On: 06/04/2020 20:01   DG Chest Port 1 View  Result Date: 06/04/2020 CLINICAL DATA:  Sepsis EXAM: PORTABLE CHEST 1 VIEW COMPARISON:  10/14/2019 FINDINGS: Lungs are well expanded, symmetric, and clear. No pneumothorax or pleural effusion. Cardiac size within normal limits. Pulmonary vascularity is normal. Osseous structures are age-appropriate. No acute bone abnormality. IMPRESSION: No active disease. Electronically Signed   By: Fidela Salisbury MD   On: 06/04/2020 01:46    Procedures .Critical Care Performed by: Tacy Learn, PA-C Authorized by: Tacy Learn, PA-C   Critical care provider  statement:    Critical care time (minutes):  45   Critical care was time spent personally by me on the following activities:  Discussions with consultants, evaluation of patient's response to treatment, examination of patient, ordering and performing treatments and interventions, ordering and review of laboratory studies, ordering and review of radiographic studies, pulse oximetry, re-evaluation of patient's condition, obtaining history from patient or surrogate and review of old charts   (including critical care time)  Medications Ordered in ED Medications  lactated ringers infusion (has no administration in time range)  lactated ringers bolus 1,000 mL (has no administration in time range)    And  lactated ringers bolus 1,000 mL (1,000 mLs Intravenous New Bag/Given 06/04/20 1904)    And  lactated ringers bolus 1,000 mL (1,000 mLs Intravenous  New Bag/Given 06/04/20 1903)  vancomycin (VANCOREADY) IVPB 1750 mg/350 mL (has no administration in time range)  vancomycin (VANCOCIN) IVPB 1000 mg/200 mL premix (has no administration in time range)  ceFEPIme (MAXIPIME) 2 g in sodium chloride 0.9 % 100 mL IVPB (has no administration in time range)  acetaminophen (TYLENOL) suppository 650 mg (has no administration in time range)  metroNIDAZOLE (FLAGYL) IVPB 500 mg (has no administration in time range)  hydrocortisone sodium succinate (SOLU-CORTEF) 100 MG injection 50 mg (has no administration in time range)  bisacodyl (DULCOLAX) suppository 10 mg (has no administration in time range)  ondansetron (ZOFRAN) tablet 4 mg (has no administration in time range)    Or  ondansetron (ZOFRAN) injection 4 mg (has no administration in time range)  acetaminophen (TYLENOL) tablet 650 mg (has no administration in time range)    Or  acetaminophen (TYLENOL) suppository 650 mg (has no administration in time range)  ceFEPIme (MAXIPIME) 2 g in sodium chloride 0.9 % 100 mL IVPB (0 g Intravenous Stopped 06/04/20 1945)    metroNIDAZOLE (FLAGYL) IVPB 500 mg (500 mg Intravenous New Bag/Given 06/04/20 1946)    ED Course  I have reviewed the triage vital signs and the nursing notes.  Pertinent labs & imaging results that were available during my care of the patient were reviewed by me and considered in my medical decision making (see chart for details).  Clinical Course as of Jun 04 2102  Fri Jun 05, 1615  4629 74 year old female returns to the ER with fever and question of altered mental status at home.  Patient arrives to the ER alert oriented, initial blood pressure reading is low at 87/58 however blood pressure cuff was adjusted and repeat blood pressure was 103/45.  Patient did have a max temp of 101 in the ER, was given as needed Tylenol.  Also found to have new oxygen requirement, per EMS was reported to be at 70%, on 2 L nasal cannula patient has maintained O2 sats in the mid 90s.  Patient's complaint at this point is feeling generally weak.  No focal findings on exam.  Labs were reviewed from her ER visit this morning, blood and urine cultures are pending, urinalysis was negative for UTI.  Repeat chest x-ray is unchanged and unremarkable.  CBC with normal white blood cell count, CMP without significant changes, INR is 2.7, patient is on Coumadin.  Initial lactic acid is 0.6.  Ammonia within normal limits at 20. Patient was given broad-spectrum antibiotics and IV fluids with plan to admit for sepsis.   [LM]  2059 Case discussed with hospitalist service who will consult for admission.    [LM]    Clinical Course User Index [LM] Roque Lias   MDM Rules/Calculators/A&P                          Final Clinical Impression(s) / ED Diagnoses Final diagnoses:  Sepsis without acute organ dysfunction, due to unspecified organism [facility])    Rx / DC Orders ED Discharge Orders    None       Roque Lias 06/04/20 2104    Drenda Freeze, MD 06/07/20 [phone]

## 2020-06-04 NOTE — ED Notes (Signed)
PTAR notified by Network engineer .

## 2020-06-04 NOTE — Progress Notes (Signed)
ANTICOAGULATION CONSULT NOTE - Initial Consult  Pharmacy Consult for warfarin Indication: atrial fibrillation  Allergies  Allergen Reactions  . Chlorhexidine Hives and Other (See Comments)    And, sores appear where applied  . Hydrocodone Nausea And Vomiting and Other (See Comments)    Hydrocodone causes vomiting  . Oxycodone Nausea And Vomiting and Other (See Comments)    Oral oxycodone causes vomiting  . Chlorhexidine Gluconate Rash and Other (See Comments)    Sores at site applied  . Zoloft [Sertraline] Hives, Swelling and Rash    Patient Measurements:    Vital Signs: Temp: 101.3 F (38.5 C) (10/15 1945) Temp Source: Rectal (10/15 1945) BP: 104/47 (10/15 1800) Pulse Rate: 93 (10/15 1800)  Labs: Recent Labs    06/03/20 0000 06/04/20 0052 06/04/20 1911  HGB  --  12.4 11.8*  HCT  --  38.3 36.4  PLT  --  165 142*  APTT  --  40* 43*  LABPROT  --  41.1* 27.7*  INR 5.3* 4.5* 2.7*  CREATININE  --  0.76 0.39*    Estimated Creatinine Clearance: 64.7 mL/min (A) (by C-G formula based on SCr of 0.39 mg/dL (L)).   Medical History: Past Medical History:  Diagnosis Date  . Abnormality of gait 03/01/2015  . Anxiety   . Arthritis    "knees" (01/19/2016)  . Asthma    as a child   . Back pain   . Cancer ([place])    / RENAL CELL CARCINOMA - GETS CT SCAN EVERY 6 MONTHS TO WATCH   . CHF (congestive heart failure) ([place])   . Chronic pain    "nerve pain from the MS"  . Complex atypical endometrial hyperplasia   . Constipation   . Depression   . Diabetes mellitus without complication ([place])   . DVT (deep venous thrombosis) ([place]) 1983   "RLE; may have just been phlebitis; it was before the age of dopplers"  . Dysrhythmia    afib   . 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 GERD (gastroesophageal reflux disease)   . HLD (hyperlipidemia)   . Hypertension   . Hypotension   . Hypothyroidism   . Joint pain   . Multiple  sclerosis ([place])    has had this may 1989-[place] reg doc  . OSA on CPAP    bipap machine - SLEEP APNEA WORSE SINCE COVID IN 1/21 PER PATIENT SETTING HAS INCREASED FROM 9 TO 30   . Osteoarthritis   . PAF (paroxysmal atrial fibrillation) ([place])    on coumadin; documented on monitor 06/2010  . Pneumonia 11/2011  . Sleep apnea    Assessment: 86 YOF presenting with palpitations, hx of Afib on warfarin PTA.  Last dose 10/13, INR downtrend to 2.7.  H/H 11.8/36.4, plts 142 s/p fluid boluses.    PTA dosing: 5mg  daily except Monday/Friday 2.5mg   Goal of Therapy:  INR 2-3 Monitor platelets by anticoagulation protocol: Yes   Plan:  Warfarin 2.5 mg PO x 1 tonight Daily INR, s/s bleeding   Bertis Ruddy, PharmD Clinical Pharmacist ED Pharmacist Phone # [phone] 06/04/2020 8:50 PM

## 2020-06-04 NOTE — ED Notes (Signed)
Patient cleaned, peri care performed, patient in clean depends. Her personal chux pad from home is soiled, so placed in patient belonging bag and returned to patient. Patient awaiting transport home with PTAR. Handoff to transport team at 0723.

## 2020-06-05 ENCOUNTER — Inpatient Hospital Stay (HOSPITAL_COMMUNITY): Payer: Medicare Other

## 2020-06-05 DIAGNOSIS — G35 Multiple sclerosis: Secondary | ICD-10-CM | POA: Diagnosis not present

## 2020-06-05 DIAGNOSIS — K5909 Other constipation: Secondary | ICD-10-CM | POA: Diagnosis not present

## 2020-06-05 DIAGNOSIS — A419 Sepsis, unspecified organism: Secondary | ICD-10-CM | POA: Diagnosis not present

## 2020-06-05 DIAGNOSIS — G9341 Metabolic encephalopathy: Secondary | ICD-10-CM | POA: Diagnosis not present

## 2020-06-05 LAB — COMPREHENSIVE METABOLIC PANEL
ALT: 15 U/L (ref 0–44)
AST: 15 U/L (ref 15–41)
Albumin: 2.6 g/dL — ABNORMAL LOW (ref 3.5–5.0)
Alkaline Phosphatase: 37 U/L — ABNORMAL LOW (ref 38–126)
Anion gap: 9 (ref 5–15)
BUN: 12 mg/dL (ref 8–23)
CO2: 29 mmol/L (ref 22–32)
Calcium: 8.3 mg/dL — ABNORMAL LOW (ref 8.9–10.3)
Chloride: 101 mmol/L (ref 98–111)
Creatinine, Ser: 0.48 mg/dL (ref 0.44–1.00)
GFR, Estimated: >60 mL/min (ref >60)
Glucose, Bld: 138 mg/dL — ABNORMAL HIGH (ref 70–99)
Potassium: 3.7 mmol/L (ref 3.5–5.1)
Sodium: 139 mmol/L (ref 135–145)
Total Bilirubin: 0.9 mg/dL (ref 0.3–1.2)
Total Protein: 4.8 g/dL — ABNORMAL LOW (ref 6.5–8.1)

## 2020-06-05 LAB — URINE CULTURE: Culture: 70000 — AB

## 2020-06-05 LAB — CBC
HCT: 35.2 % — ABNORMAL LOW (ref 36.0–46.0)
Hemoglobin: 11.5 g/dL — ABNORMAL LOW (ref 12.0–15.0)
MCH: 32 pg (ref 26.0–34.0)
MCHC: 32.7 g/dL (ref 30.0–36.0)
MCV: 98.1 fL (ref 80.0–100.0)
Platelets: 130 10*3/uL — ABNORMAL LOW (ref 150–400)
RBC: 3.59 MIL/uL — ABNORMAL LOW (ref 3.87–5.11)
RDW: 13.2 % (ref 11.5–15.5)
WBC: 8.7 10*3/uL (ref 4.0–10.5)
nRBC: 0 % (ref 0.0–0.2)

## 2020-06-05 LAB — PROTIME-INR
INR: 2.6 — ABNORMAL HIGH (ref 0.8–1.2)
Prothrombin Time: 26.8 seconds — ABNORMAL HIGH (ref 11.4–15.2)

## 2020-06-05 LAB — MRSA PCR SCREENING: MRSA by PCR: NEGATIVE

## 2020-06-05 LAB — CORTISOL-AM, BLOOD: Cortisol - AM: 4.9 ug/dL — ABNORMAL LOW (ref 6.7–22.6)

## 2020-06-05 LAB — PROCALCITONIN: Procalcitonin: <0.1 ng/mL

## 2020-06-05 MED ORDER — DILTIAZEM HCL 60 MG PO TABS
60.0000 mg | ORAL_TABLET | Freq: Three times a day (TID) | ORAL | Status: DC
  Administered 2020-06-05: 60 mg via ORAL
  Filled 2020-06-05: qty 1

## 2020-06-05 MED ORDER — AMIODARONE HCL IN DEXTROSE 360-4.14 MG/200ML-% IV SOLN
30.0000 mg/h | INTRAVENOUS | Status: DC
  Administered 2020-06-05 – 2020-06-07 (×3): 30 mg/h via INTRAVENOUS
  Filled 2020-06-05 (×16): qty 200

## 2020-06-05 MED ORDER — WARFARIN SODIUM 5 MG PO TABS
5.0000 mg | ORAL_TABLET | Freq: Once | ORAL | Status: AC
  Administered 2020-06-05: 5 mg via ORAL
  Filled 2020-06-05: qty 1

## 2020-06-05 MED ORDER — LACTATED RINGERS IV SOLN
INTRAVENOUS | Status: DC
  Administered 2020-06-05 – 2020-06-06 (×2): 1 mL via INTRAVENOUS

## 2020-06-05 MED ORDER — SORBITOL 70 % SOLN
960.0000 mL | TOPICAL_OIL | Freq: Once | ORAL | Status: DC
  Filled 2020-06-05: qty 473

## 2020-06-05 MED ORDER — AMIODARONE LOAD VIA INFUSION
150.0000 mg | Freq: Once | INTRAVENOUS | Status: AC
  Administered 2020-06-05: 150 mg via INTRAVENOUS
  Filled 2020-06-05: qty 83.34

## 2020-06-05 MED ORDER — AMIODARONE HCL IN DEXTROSE 360-4.14 MG/200ML-% IV SOLN
60.0000 mg/h | INTRAVENOUS | Status: AC
  Administered 2020-06-05 (×2): 60 mg/h via INTRAVENOUS
  Filled 2020-06-05 (×2): qty 200

## 2020-06-05 NOTE — Progress Notes (Addendum)
ANTICOAGULATION CONSULT NOTE - Consult  Pharmacy Consult for warfarin Indication: atrial fibrillation  Allergies  Allergen Reactions  . Chlorhexidine Hives and Other (See Comments)    And, sores appear where applied  . Hydrocodone Nausea And Vomiting and Other (See Comments)    Hydrocodone causes vomiting  . Oxycodone Nausea And Vomiting and Other (See Comments)    Oral oxycodone causes vomiting  . Zoloft [Sertraline] Hives, Swelling and Rash    Patient Measurements:    Vital Signs: Temp: 98.1 F (36.7 C) (10/16 0354) Temp Source: Oral (10/16 0354) BP: 101/50 (10/16 0743) Pulse Rate: 98 (10/16 0743)  Labs: Recent Labs    06/04/20 0052 06/04/20 0052 06/04/20 1911 06/05/20 0035  HGB 12.4   < > 11.8* 11.5*  HCT 38.3  --  36.4 35.2*  PLT 165  --  142* 130*  APTT 40*  --  43*  --   LABPROT 41.1*  --  27.7* 26.8*  INR 4.5*  --  2.7* 2.6*  CREATININE 0.76  --  0.39* 0.48   < > = values in this interval not displayed.    Estimated Creatinine Clearance: 64.7 mL/min (by C-G formula based on SCr of 0.48 mg/dL).   Medical History: Past Medical History:  Diagnosis Date  . Abnormality of gait 03/01/2015  . Anxiety   . Arthritis    "knees" (01/19/2016)  . Asthma    as a child   . Back pain   . Cancer ([place])    / RENAL CELL CARCINOMA - GETS CT SCAN EVERY 6 MONTHS TO WATCH   . CHF (congestive heart failure) ([place])   . Chronic pain    "nerve pain from the MS"  . Complex atypical endometrial hyperplasia   . Constipation   . Depression   . Diabetes mellitus without complication ([place])   . DVT (deep venous thrombosis) ([place]) 1983   "RLE; may have just been phlebitis; it was before the age of dopplers"  . Dysrhythmia    afib   . 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 GERD (gastroesophageal reflux disease)   . HLD (hyperlipidemia)   . Hypertension   . Hypotension   . Hypothyroidism   . Joint pain   . Multiple  sclerosis ([place])    has had this may 1989-[facility] reg doc  . OSA on CPAP    bipap machine - SLEEP APNEA WORSE SINCE COVID IN 1/21 PER PATIENT SETTING HAS INCREASED FROM 9 TO 30   . Osteoarthritis   . PAF (paroxysmal atrial fibrillation) ([place])    on coumadin; documented on monitor 06/2010  . Pneumonia 11/2011  . Sleep apnea    Assessment: 55 YOF presenting with palpitations, hx of Afib on warfarin PTA.  Last dose PTA 10/13, INR therapeutic 2.6.  H/H 11.5/35.2, plts 130 s/p fluid boluses.  Patient has missed 2 doses, but also had a supratherapeutic INR at midnight on 10/14 and again 10/15 @ 0052, but then had therapeutic INR later that day at [facility]. No notes of vitamin K given or blood products. May need adjustment to home dose.  PTA dosing: 5mg  daily except Monday/Friday 2.5mg   Drug interactions: started Flagyl 10/15  Goal of Therapy:  INR 2-3 Monitor platelets by anticoagulation protocol: Yes   Plan:  Warfarin 5 mg x 1 Monitor daily INR, CBC, clinical course, s/sx of bleed, PO intake, DDI   Thank you for allowing  [name] to participate in this patients care.   Jens Som, PharmD Please see amion for complete clinical pharmacist phone list. 06/05/2020 10:30 AM

## 2020-06-05 NOTE — Progress Notes (Addendum)
PROGRESS NOTE  Toni [name] TMH:[id number] DOB: 1946-03-02 DOA: 06/04/2020 PCP: Prince Solian, MD  HPI/Recap of past 24 hours: HPI from Dr Sonia Side is a 74 y.o. female with medical history significant of MS, a.fib on coumadin, OSA on BIPAP, uses oxygen with BIPAP at night only, frequent recurrent UTIs due to MS. Pt seen in ED earlier this am for palpitations, possible UTI.  Tm at that time was 99.9, was discharged. Went home, pt took nap, woke up and was confused per family.  EMS called.  O2 sat per EMS 70, Temp 100. Patient denies: Cough, SOB, CP, N/V, changes in bowel habits (has chronic constipation from MS). Has mild LLQ abd TTP. In the ED, Tm 101.3.  Initial BP 87/58.  Improves to 921 systolic after 30 ml/kg bolus (this about baseline per pt and daughter). AMS is resolved at this time. WBC 10.4k. was 11.2k this AM, INR 2.7. CXR neg, UA neg, CT abd/pelvis: shows constipation and a small renal cell carcinoma that is known (just being followed at the moment as it hasnt done much for years). COVID and flu neg. Started on empiric cefepime, flagyl, vanc.  Patient admitted for further management.    Patient denied any new complaints.  Denied any chest pain, worsening shortness of breath, abdominal pain, nausea/vomiting.  Discussed with patient and daughter extensively about medical plan.   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   Chronic constipation with overflow incontinence   Permanent atrial fibrillation (HCC)   Obstructive sleep apnea treated with bilevel positive airway pressure (BiPAP)   Type 2 diabetes mellitus without complication, without long-term current use of insulin (HCC)   Acute on chronic respiratory failure with hypoxia (HCC)   Acute metabolic encephalopathy   Sepsis of unknown origin Possible ?UTI- Pt completed keflex course and has been on  suppressive macrobid Febrile, tachycardic on initial presentation  Currently afebrile, with no leukocytosis UA unremarkable, UC showed 70,000 multiple species BC x2 pending Procalcitonin negative Lactic acid negative Covid/flu negative Chest x-ray showed mild right base atelectasis, no edema or airspace opacity noted Continue IV fluids Continue Flagyl, vancomycin, cefepime, plan to de-escalate  Acute respiratory failure with hypoxia EMS noted sats 70% (unsure how accurate this is) Currently requiring about 2 L of O2, saturating above 90 Chest x-ray unremarkable for any infection Covid/flu negative Continue supplemental oxygen  Acute metabolic encephalopathy Resolved  Permanent A. Fib Heart rate uncontrolled Attempted Cardizem, with no improvement and blood pressure trending down Switch to amiodarone drip Continue Coumadin  Reports hx of central sleep apnea on BiPAP Continue BiPAP per home settings  Chronic constipation Continue bowel regimen  Multiple sclerosis Continue home management         Malnutrition Type:      Malnutrition Characteristics:      Nutrition Interventions:       Estimated body mass index is 33.19 kg/m as calculated from the following:   Height as of an earlier encounter on 06/04/20: 5\' 3"  (1.6 m).   Weight as of an earlier encounter on 06/04/20: 85 kg.     Code Status: Full  Family Communication: Discussed extensively with daughter at bedside  Disposition Plan: Status is: Inpatient  Remains inpatient appropriate because:Inpatient level of care appropriate due to severity of illness   Dispo: The patient is from: Home              Anticipated d/c  is to: Home              Anticipated d/c date is: 2 days              Patient currently is not medically stable to d/c.      Consultants: None  Procedures:  None  Antimicrobials:  Vancomycin  Cefepime  Flagyl  DVT prophylaxis: Coumadin   Objective: Vitals:    06/05/20 1152 06/05/20 1237 06/05/20 1348 06/05/20 1446  BP: (!) 103/40  (!) 97/49 (!) 109/44  Pulse: (!) 109 (!) 105 (!) 155 (!) 101  Resp: 17 12 15 11   Temp: 98 F (36.7 C)     TempSrc: Oral     SpO2: 92% 96% 95% 96%    Intake/Output Summary (Last 24 hours) at 06/05/2020 1645 Last data filed at 06/05/2020 0339 Gross per 24 hour  Intake 2524.55 ml  Output 1300 ml  Net 1224.55 ml   There were no vitals filed for this visit.  Exam:  General: NAD   Cardiovascular: S1, S2 present  Respiratory: CTAB  Abdomen: Soft, nontender, nondistended, bowel sounds present  Musculoskeletal: No bilateral pedal edema noted  Skin: Normal  Psychiatry: Normal mood    Data Reviewed: CBC: Recent Labs  Lab 06/04/20 0052 06/04/20 1911 06/05/20 0035  WBC 11.2* 10.4 8.7  NEUTROABS 7.5 7.6  --   HGB 12.4 11.8* 11.5*  HCT 38.3 36.4 35.2*  MCV 95.0 96.6 98.1  PLT 165 142* 161*   Basic Metabolic Panel: Recent Labs  Lab 06/04/20 0052 06/04/20 1911 06/05/20 0035  NA 138 139 139  K 4.3 4.0 3.7  CL 100 100 101  CO2 32 30 29  GLUCOSE 101* 79 138*  BUN 15 13 12   CREATININE 0.76 0.39* 0.48  CALCIUM 8.6* 8.4* 8.3*   GFR: Estimated Creatinine Clearance: 64.7 mL/min (by C-G formula based on SCr of 0.48 mg/dL). Liver Function Tests: Recent Labs  Lab 06/04/20 0052 06/04/20 1911 06/05/20 0035  AST 15 13* 15  ALT 18 15 15   ALKPHOS 45 42 37*  BILITOT 0.3 0.9 0.9  PROT 5.7* 5.1* 4.8*  ALBUMIN 3.1* 2.7* 2.6*   No results for input(s): LIPASE, AMYLASE in the last 168 hours. Recent Labs  Lab 06/04/20 1911  AMMONIA 20   Coagulation Profile: Recent Labs  Lab 06/03/20 0000 06/04/20 0052 06/04/20 1911 06/05/20 0035  INR 5.3* 4.5* 2.7* 2.6*   Cardiac Enzymes: No results for input(s): CKTOTAL, CKMB, CKMBINDEX, TROPONINI in the last 168 hours. BNP (last 3 results) No results for input(s): PROBNP in the last 8760 hours. HbA1C: No results for input(s): HGBA1C in the last  72 hours. CBG: No results for input(s): GLUCAP in the last 168 hours. Lipid Profile: No results for input(s): CHOL, HDL, LDLCALC, TRIG, CHOLHDL, LDLDIRECT in the last 72 hours. Thyroid Function Tests: No results for input(s): TSH, T4TOTAL, FREET4, T3FREE, THYROIDAB in the last 72 hours. Anemia Panel: No results for input(s): VITAMINB12, FOLATE, FERRITIN, TIBC, IRON, RETICCTPCT in the last 72 hours. Urine analysis:    Component Value Date/Time   COLORURINE YELLOW 06/04/2020 0320   APPEARANCEUR CLEAR 06/04/2020 0320   APPEARANCEUR Clear 12/18/2014 1156   LABSPEC 1.005 06/04/2020 0320   PHURINE 7.0 06/04/2020 0320   GLUCOSEU NEGATIVE 06/04/2020 0320   HGBUR NEGATIVE 06/04/2020 0320   BILIRUBINUR NEGATIVE 06/04/2020 0320   BILIRUBINUR Negative 12/18/2014 1156   KETONESUR NEGATIVE 06/04/2020 0320   PROTEINUR NEGATIVE 06/04/2020 0320   UROBILINOGEN 0.2 08/05/2014 0951  NITRITE NEGATIVE 06/04/2020 0320   LEUKOCYTESUR SMALL (A) 06/04/2020 0320   Sepsis Labs: @LABRCNTIP (procalcitonin:4,lacticidven:4)  ) Recent Results (from the past 240 hour(s))  Blood Culture (routine x 2)     Status: None (Preliminary result)   Collection Time: 06/04/20 12:50 AM   Specimen: BLOOD  Result Value Ref Range Status   Specimen Description BLOOD LEFT ANTECUBITAL  Final   Special Requests   Final    BOTTLES DRAWN AEROBIC AND ANAEROBIC Blood Culture adequate volume   Culture   Final    NO GROWTH 1 DAY Performed at [facility], [address], [place], [place] [address]    Report Status PENDING  Incomplete  Respiratory Panel by RT PCR (Flu A&B, Covid) - Nasopharyngeal Swab     Status: None   Collection Time: 06/04/20 12:52 AM   Specimen: Nasopharyngeal Swab  Result Value Ref Range Status   SARS Coronavirus 2 by RT PCR NEGATIVE NEGATIVE Final    Comment: (NOTE) SARS-CoV-2 target nucleic acids are NOT DETECTED.  The SARS-CoV-2 RNA is generally detectable in upper respiratoy specimens during  the acute phase of infection. The lowest concentration of SARS-CoV-2 viral copies this assay can detect is 131 copies/mL. A negative result does not preclude SARS-Cov-2 infection and should not be used as the sole basis for treatment or other patient management decisions. A negative result may occur with  improper specimen collection/handling, submission of specimen other than nasopharyngeal swab, presence of viral mutation(s) within the areas targeted by this assay, and inadequate number of viral copies (<131 copies/mL). A negative result must be combined with clinical observations, patient history, and epidemiological information. The expected result is Negative.  Fact Sheet for Patients:  PinkCheek.be  Fact Sheet for Healthcare Providers:  GravelBags.it  This test is no t yet approved or cleared by the [place] FDA and  has been authorized for detection and/or diagnosis of SARS-CoV-2 by FDA under an Emergency Use Authorization (EUA). This EUA will remain  in effect (meaning this test can be used) for the duration of the COVID-19 declaration under Section 564(b)(1) of the Act, 21 U.S.C. section 360bbb-3(b)(1), unless the authorization is terminated or revoked sooner.     Influenza A by PCR NEGATIVE NEGATIVE Final   Influenza B by PCR NEGATIVE NEGATIVE Final    Comment: (NOTE) The Xpert Xpress SARS-CoV-2/FLU/RSV assay is intended as an aid in  the diagnosis of influenza from Nasopharyngeal swab specimens and  should not be used as a sole basis for treatment. Nasal washings and  aspirates are unacceptable for Xpert Xpress SARS-CoV-2/FLU/RSV  testing.  Fact Sheet for Patients: PinkCheek.be  Fact Sheet for Healthcare Providers: GravelBags.it  This test is not yet approved or cleared by the [place] FDA and  has been authorized for detection and/or  diagnosis of SARS-CoV-2 by  FDA under an Emergency Use Authorization (EUA). This EUA will remain  in effect (meaning this test can be used) for the duration of the  Covid-19 declaration under Section 564(b)(1) of the Act, 21  U.S.C. section 360bbb-3(b)(1), unless the authorization is  terminated or revoked. Performed at [facility], [place] [address] [place], [place] [address]   Blood Culture (routine x 2)     Status: None (Preliminary result)   Collection Time: 06/04/20 12:54 AM   Specimen: BLOOD RIGHT HAND  Result Value Ref Range Status   Specimen Description BLOOD RIGHT HAND  Final   Special Requests   Final    BOTTLES  DRAWN AEROBIC AND ANAEROBIC Blood Culture results may not be optimal due to an inadequate volume of blood received in culture bottles   Culture   Final    NO GROWTH 1 DAY Performed at [facility] [address] [place], [place] [address]    Report Status PENDING  Incomplete  Urine culture     Status: Abnormal   Collection Time: 06/04/20  3:13 AM   Specimen: In/Out Cath Urine  Result Value Ref Range Status   Specimen Description IN/OUT CATH URINE  Final   Special Requests   Final    NONE Performed at [facility], [place] [address], [place], [place] [address]    Culture (A)  Final    70,000 COLONIES/mL MULTIPLE SPECIES PRESENT, SUGGEST RECOLLECTION   Report Status 06/05/2020 FINAL  Final  MRSA PCR Screening     Status: None   Collection Time: 06/04/20 11:53 PM   Specimen: Nasopharyngeal  Result Value Ref Range Status   MRSA by PCR NEGATIVE NEGATIVE Final    Comment:        The GeneXpert MRSA Assay (FDA approved for NASAL specimens only), is one component of a comprehensive MRSA colonization surveillance program. It is not intended to diagnose MRSA infection nor to guide or monitor treatment for MRSA infections. Performed at [facility], [place] [address], [place], [place] [address]       Studies: DG CHEST PORT 1  VIEW  Result Date: 06/05/2020 CLINICAL DATA:  Respiratory failure/hypoxia EXAM: PORTABLE CHEST 1 VIEW COMPARISON:  June 04, 2020 FINDINGS: There is cardiomegaly with pulmonary vascularity within normal limits. There is mild right base atelectasis. Lungs elsewhere clear. No adenopathy. Postoperative change noted in the lower cervical spine. IMPRESSION: Cardiomegaly. Mild right base atelectasis. No edema or airspace opacity. Electronically Signed   By: Lowella Grip III M.D.   On: 06/05/2020 09:36   DG Chest Port 1 View  Result Date: 06/04/2020 CLINICAL DATA:  74 year old female with shortness of breath and fever. EXAM: PORTABLE CHEST 1 VIEW COMPARISON:  Chest CT dated 07/08/2017. chest radiograph dated 06/04/2020. FINDINGS: There are bibasilar interstitial coarsening or atelectasis. No focal consolidation, pleural effusion or pneumothorax. Stable cardiomegaly. Atherosclerotic calcification of the aorta. No acute osseous pathology. Cervical ACDF. IMPRESSION: No active disease. Electronically Signed   By: Anner Crete M.D.   On: 06/04/2020 20:01    Scheduled Meds: . acetaminophen  650 mg Rectal Once  . buPROPion  300 mg Oral Daily  . famotidine  20 mg Oral QHS  . gabapentin  300 mg Oral Q6H  . insulin aspart  0-15 Units Subcutaneous TID WC  . insulin aspart  0-5 Units Subcutaneous QHS  . lactulose  10 g Oral Daily  . levothyroxine  75 mcg Oral QAC breakfast  . linaclotide  145 mcg Oral QAC breakfast  . melatonin  5 mg Oral QHS  . ondansetron  8 mg Oral q morning - 10a  . pantoprazole  40 mg Oral Daily  . psyllium  1 packet Oral QPM  . simvastatin  10 mg Oral QPM  . sorbitol, milk of mag, mineral oil, glycerin (SMOG) enema  960 mL Rectal Once  . tamsulosin  0.4 mg Oral q AM  . warfarin  5 mg Oral ONCE-1600  . Warfarin - Pharmacist Dosing Inpatient   Does not apply q1600    Continuous Infusions: . amiodarone 60 mg/hr (06/05/20 1353)   Followed by  . amiodarone    .  ceFEPime  (MAXIPIME) IV 2 g (06/05/20 0928)  . metronidazole 500 mg (06/05/20 0925)  . vancomycin 1,000 mg (06/05/20 0535)     LOS: 1 day     Alma Friendly, MD [facility]  If 7PM-7AM, please contact night-coverage [email or web address] 06/05/2020, 4:45 PM

## 2020-06-05 NOTE — Progress Notes (Signed)
Placed patient on Vauto CPAP as per home regimen with 3L bleed, patient tolerated well and stated it felt like the pressure she has at home, will continue to monitor patient.

## 2020-06-06 DIAGNOSIS — A419 Sepsis, unspecified organism: Secondary | ICD-10-CM | POA: Diagnosis not present

## 2020-06-06 DIAGNOSIS — G35 Multiple sclerosis: Secondary | ICD-10-CM | POA: Diagnosis not present

## 2020-06-06 DIAGNOSIS — K5909 Other constipation: Secondary | ICD-10-CM | POA: Diagnosis not present

## 2020-06-06 DIAGNOSIS — G9341 Metabolic encephalopathy: Secondary | ICD-10-CM | POA: Diagnosis not present

## 2020-06-06 LAB — PROTIME-INR
INR: 2.7 — ABNORMAL HIGH (ref 0.8–1.2)
Prothrombin Time: 27.4 seconds — ABNORMAL HIGH (ref 11.4–15.2)

## 2020-06-06 LAB — CBC WITH DIFFERENTIAL/PLATELET
Abs Immature Granulocytes: 0.02 10*3/uL (ref 0.00–0.07)
Basophils Absolute: 0.1 10*3/uL (ref 0.0–0.1)
Basophils Relative: 1 %
Eosinophils Absolute: 0.4 10*3/uL (ref 0.0–0.5)
Eosinophils Relative: 5 %
HCT: 34 % — ABNORMAL LOW (ref 36.0–46.0)
Hemoglobin: 11.1 g/dL — ABNORMAL LOW (ref 12.0–15.0)
Immature Granulocytes: 0 %
Lymphocytes Relative: 18 %
Lymphs Abs: 1.5 10*3/uL (ref 0.7–4.0)
MCH: 31.4 pg (ref 26.0–34.0)
MCHC: 32.6 g/dL (ref 30.0–36.0)
MCV: 96.3 fL (ref 80.0–100.0)
Monocytes Absolute: 1.1 10*3/uL — ABNORMAL HIGH (ref 0.1–1.0)
Monocytes Relative: 13 %
Neutro Abs: 5.2 10*3/uL (ref 1.7–7.7)
Neutrophils Relative %: 63 %
Platelets: 158 10*3/uL (ref 150–400)
RBC: 3.53 MIL/uL — ABNORMAL LOW (ref 3.87–5.11)
RDW: 13.3 % (ref 11.5–15.5)
WBC: 8.1 10*3/uL (ref 4.0–10.5)
nRBC: 0 % (ref 0.0–0.2)

## 2020-06-06 LAB — BASIC METABOLIC PANEL
Anion gap: 4 — ABNORMAL LOW (ref 5–15)
BUN: 13 mg/dL (ref 8–23)
CO2: 33 mmol/L — ABNORMAL HIGH (ref 22–32)
Calcium: 8.1 mg/dL — ABNORMAL LOW (ref 8.9–10.3)
Chloride: 103 mmol/L (ref 98–111)
Creatinine, Ser: 0.45 mg/dL (ref 0.44–1.00)
GFR, Estimated: >60 mL/min (ref >60)
Glucose, Bld: 89 mg/dL (ref 70–99)
Potassium: 4.3 mmol/L (ref 3.5–5.1)
Sodium: 140 mmol/L (ref 135–145)

## 2020-06-06 MED ORDER — BACLOFEN 10 MG PO TABS
20.0000 mg | ORAL_TABLET | Freq: Two times a day (BID) | ORAL | Status: DC | PRN
  Administered 2020-06-09 – 2020-06-10 (×2): 20 mg via ORAL
  Filled 2020-06-06 (×3): qty 2

## 2020-06-06 MED ORDER — WARFARIN SODIUM 2.5 MG PO TABS
2.5000 mg | ORAL_TABLET | Freq: Once | ORAL | Status: AC
  Administered 2020-06-06: 2.5 mg via ORAL
  Filled 2020-06-06: qty 1

## 2020-06-06 MED ORDER — MIDODRINE HCL 5 MG PO TABS
5.0000 mg | ORAL_TABLET | Freq: Three times a day (TID) | ORAL | Status: DC
  Administered 2020-06-06 – 2020-06-11 (×17): 5 mg via ORAL
  Filled 2020-06-06 (×17): qty 1

## 2020-06-06 MED ORDER — LORATADINE 10 MG PO TABS
10.0000 mg | ORAL_TABLET | Freq: Every day | ORAL | Status: DC
  Administered 2020-06-07 – 2020-06-11 (×5): 10 mg via ORAL
  Filled 2020-06-06 (×6): qty 1

## 2020-06-06 NOTE — Evaluation (Signed)
Physical Therapy Evaluation Patient Details Name: Toni [name] MRN: [id number] DOB: Aug 02, 1946 Today's Date: 06/06/2020   History of Present Illness  Pt is a 74 y.o. female with PMH of persistent A. fib, hypertension, HLD, DVT, MS, OSA, COVID (07/2019) presents to ED 06/04/20 for acute onset, persistent and progressively worsening abdominal pain and distention. Workup for Afib with RVR, UTI, sepsis, acute respiratory failure.    Clinical Impression  Pt presents with an overall decrease in functional mobility secondary to above. PTA, pt from home with husband and 24/7 caregiver who assists with lateral scoots to w/c and ADL tasks; pt also currently working with HHPT/OT services. Today, pt able to perform scooting transfer to recliner with slideboard and minA+2. See below for vitals during session. Pt would benefit from continued acute PT services to maximize functional mobility and independence prior to d/c with continued HH therapies and 24/7 assist.   Supine BP 97/54  Sitting BP 65/35 Post-transfer BP 71/42 Sitting reclined ~5-min BP 64/41  HR 98-156 SpO2 down to 79% on RA, maintaining 94% on 2L O2 [place]    Follow Up Recommendations Home health PT;Supervision/Assistance - 24 hour    Equipment Recommendations  None recommended by PT    Recommendations for Other Services       Precautions / Restrictions Precautions Precautions: Fall;Other (comment) Precaution Comments: Watch BP (pt reports low baseline since COVID last year), HR (afib) and SpO2 (not on home O2) Restrictions Weight Bearing Restrictions: No      Mobility  Bed Mobility Overal bed mobility: Needs Assistance Bed Mobility: Supine to Sit     Supine to sit: Min assist;HOB elevated     General bed mobility comments: Pt reaching for bedrail and pulling hips and trunk towards EOB. Min A for bring hips towards EOB with bed pad  Transfers Overall transfer level: Needs assistance Equipment used: Sliding  board Transfers: Lateral/Scoot Transfers          Lateral/Scoot Transfers: Min assist;+2 physical assistance;+2 safety/equipment;With slide board General transfer comment: Min A +2 for lateral scoot to drop arm recliner with slide board and bed pad; cues for sequencing and safety; pt fearful of falling forward requiring encouragement for decreased anxiety  Ambulation/Gait                Stairs            Wheelchair Mobility    Modified Rankin (Stroke Patients Only)       Balance Overall balance assessment: Needs assistance Sitting-balance support: No upper extremity supported;Feet supported Sitting balance-Leahy Scale: Fair                                       Pertinent Vitals/Pain Pain Assessment: Faces Faces Pain Scale: No hurt Pain Intervention(s): Monitored during session    Home Living Family/patient expects to be discharged to:: Private residence Living Arrangements: Spouse/significant other;Other (Comment) (24/7 caregiver) Available Help at Discharge: Family;Available 24 hours/day;Personal care attendant Type of Home: House Home Access: Ramped entrance     Home Layout: Two level;Able to live on main level with bedroom/bathroom Home Equipment: Bedside commode;Shower seat - built in;Grab bars - tub/shower;Grab bars - toilet;Hand held shower head;Hospital bed;Wheelchair - manual;Other (comment) (drop arm BSC; sit<>stand chair) Additional Comments: Hired assist 7 days a week; sit <> stand lift    Prior Function Level of Independence: Needs assistance   Gait / Transfers Assistance Needed:  sliding transfers to w/c, BSC  ADL's / Homemaking Assistance Needed: able to dress her upper body, had assist for briefs and pants. Uses purewick and briefs for incontinence management. Aide assists with bathing. Husband/aide assists with IADLs  Comments: able to mobilize with w/c      Hand Dominance   Dominant Hand: Right    Extremity/Trunk  Assessment   Upper Extremity Assessment Upper Extremity Assessment: Generalized weakness;RUE deficits/detail;LUE deficits/detail RUE Deficits / Details: Increased edema. Decreased grasp strength and FM coorindation. WFL for gross motor coorindation.  RUE Coordination: decreased fine motor LUE Deficits / Details: Increased edema. Decreased grasp strength and FM coorindation. WFL for gross motor coorindation.  LUE Coordination: decreased fine motor    Lower Extremity Assessment Lower Extremity Assessment: Generalized weakness (Hips functionally <3/5, knee flex/ext at least 3/5)    Cervical / Trunk Assessment Cervical / Trunk Assessment: Other exceptions Cervical / Trunk Exceptions: Increased body habitus  Communication   Communication: No difficulties  Cognition Arousal/Alertness: Awake/alert Behavior During Therapy: WFL for tasks assessed/performed Overall Cognitive Status: Within Functional Limits for tasks assessed                                 General Comments: WFL for simple tasks, not formally assessed. Noted tangential with speech requiring some redirection in conversation. Great awareness of history, PLOF details and current functional status      General Comments General comments (skin integrity, edema, etc.): HR up to 156 while scooting. SpO2 down to 81% on RA, returning to 94% on 2L O2 [place]. Pt hypotensive throughout session, although minimal symptoms and reports baseline "since I had COVID" (RN notified)    Exercises General Exercises - Lower Extremity Ankle Circles/Pumps: AROM;Both;Seated Long Arc Quad: AROM;Both;Seated   Assessment/Plan    PT Assessment Patient needs continued PT services  PT Problem List Decreased strength;Decreased activity tolerance;Decreased balance;Decreased mobility;Cardiopulmonary status limiting activity       PT Treatment Interventions DME instruction;Functional mobility training;Therapeutic activities;Therapeutic  exercise;Balance training;Patient/family education;Wheelchair mobility training    PT Goals (Current goals can be found in the Care Plan section)  Acute Rehab PT Goals Patient Stated Goal: Go home to start therapy again PT Goal Formulation: With patient Time For Goal Achievement: 06/20/20 Potential to Achieve Goals: Good    Frequency Min 3X/week   Barriers to discharge        Co-evaluation PT/OT/SLP Co-Evaluation/Treatment: Yes Reason for Co-Treatment: For patient/therapist safety;To address functional/ADL transfers;Complexity of the patient's impairments (multi-system involvement)           AM-PAC PT "6 Clicks" Mobility  Outcome Measure Help needed turning from your back to your side while in a flat bed without using bedrails?: A Little Help needed moving from lying on your back to sitting on the side of a flat bed without using bedrails?: A Little Help needed moving to and from a bed to a chair (including a wheelchair)?: A Little Help needed standing up from a chair using your arms (e.g., wheelchair or bedside chair)?: Total Help needed to walk in hospital room?: Total Help needed climbing 3-5 steps with a railing? : Total 6 Click Score: 12    End of Session Equipment Utilized During Treatment: Oxygen Activity Tolerance: Patient tolerated treatment well Patient left: in chair;with call bell/phone within reach;with chair alarm set Nurse Communication: Mobility status PT Visit Diagnosis: Other abnormalities of gait and mobility (R26.89);Muscle weakness (generalized) (M62.81)    Time:  3664-4034 PT Time Calculation (min) (ACUTE ONLY): 41 min   Charges:   PT Evaluation $PT Eval Moderate Complexity: [place], PT, DPT Acute Rehabilitation Services  Pager [phone] Office [place] 06/06/2020, 9:46 AM

## 2020-06-06 NOTE — Progress Notes (Signed)
ANTICOAGULATION CONSULT NOTE - Consult  Pharmacy Consult for warfarin Indication: atrial fibrillation  Allergies  Allergen Reactions  . Chlorhexidine Hives and Other (See Comments)    And, sores appear where applied  . Hydrocodone Nausea And Vomiting and Other (See Comments)    Hydrocodone causes vomiting  . Oxycodone Nausea And Vomiting and Other (See Comments)    Oral oxycodone causes vomiting  . Zoloft [Sertraline] Hives, Swelling and Rash    Patient Measurements:    Vital Signs: Temp: 98 F (36.7 C) (10/17 0411) Temp Source: Oral (10/17 0411) BP: 93/60 (10/17 0850) Pulse Rate: 98 (10/17 0411)  Labs: Recent Labs    06/04/20 0052 06/04/20 0052 06/04/20 1911 06/04/20 1911 06/05/20 0035 06/06/20 0132  HGB 12.4   < > 11.8*   < > 11.5* 11.1*  HCT 38.3   < > 36.4  --  35.2* 34.0*  PLT 165   < > 142*  --  130* 158  APTT 40*  --  43*  --   --   --   LABPROT 41.1*   < > 27.7*  --  26.8* 27.4*  INR 4.5*   < > 2.7*  --  2.6* 2.7*  CREATININE 0.76   < > 0.39*  --  0.48 0.45   < > = values in this interval not displayed.    Estimated Creatinine Clearance: 64.7 mL/min (by C-G formula based on SCr of 0.45 mg/dL).   Medical History: Past Medical History:  Diagnosis Date  . Abnormality of gait 03/01/2015  . Anxiety   . Arthritis    "knees" (01/19/2016)  . Asthma    as a child   . Back pain   . Cancer ([place])    / RENAL CELL CARCINOMA - GETS CT SCAN EVERY 6 MONTHS TO WATCH   . CHF (congestive heart failure) ([place])   . Chronic pain    "nerve pain from the MS"  . Complex atypical endometrial hyperplasia   . Constipation   . Depression   . Diabetes mellitus without complication ([place])   . DVT (deep venous thrombosis) ([place]) 1983   "RLE; may have just been phlebitis; it was before the age of dopplers"  . Dysrhythmia    afib   . 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 GERD (gastroesophageal reflux disease)   . HLD  (hyperlipidemia)   . Hypertension   . Hypotension   . Hypothyroidism   . Joint pain   . Multiple sclerosis ([place])    has had this may 1989-[place] reg doc  . OSA on CPAP    bipap machine - SLEEP APNEA WORSE SINCE COVID IN 1/21 PER PATIENT SETTING HAS INCREASED FROM 9 TO 30   . Osteoarthritis   . PAF (paroxysmal atrial fibrillation) ([place])    on coumadin; documented on monitor 06/2010  . Pneumonia 11/2011  . Sleep apnea    Assessment: 86 YOF presenting with palpitations, history of Afib on anticoagulation with warfarin. Her last dose prior to admission was on 10/13. She did not receive warfarin on 10/14 or 10/15. Her INR is currently therapeutic at 2.7. Her warfarin requirements are likely decreased due to acute illness, antibiotic therapy (metronidazole), and amiodarone was started 10/16.    PTA dosing: 5mg  daily except Monday/Friday 2.5mg   Drug interactions: started Flagyl 10/15, currently on an amiodarone drip as well  Goal of Therapy:  INR 2-3 Monitor platelets  by anticoagulation protocol: Yes   Plan:  Decrease Warfarin to 2.5mg  today Monitor daily INR, CBC, clinical course, s/sx of bleed, drug interactions   Legrand Como, PharmD, BCPS, BCIDP Clinical Pharmacist Phone: [phone] Please check AMION for all [place] numbers 06/06/2020, 10:30 AM

## 2020-06-06 NOTE — Progress Notes (Signed)
PROGRESS NOTE  Toni [name] BMW:[id number] DOB: 06-Nov-1945 DOA: 06/04/2020 PCP: Toni Solian, MD  HPI/Recap of past 24 hours: HPI from Dr Toni [name] is a 74 y.o. female with medical history significant of MS, a.fib on coumadin, OSA on BIPAP, uses oxygen with BIPAP at night only, frequent recurrent UTIs due to MS. Pt seen in ED earlier this am for palpitations, possible UTI.  Tm at that time was 99.9, was discharged. Went home, pt took nap, woke up and was confused per family.  EMS called.  O2 sat per EMS 70, Temp 100. Patient denies: Cough, SOB, CP, N/V, changes in bowel habits (has chronic constipation from MS). Has mild LLQ abd TTP. In the ED, Tm 101.3.  Initial BP 87/58.  Improves to 272 systolic after 30 ml/kg bolus (this about baseline per pt and daughter). AMS is resolved at this time. WBC 10.4k. was 11.2k this AM, INR 2.7. CXR neg, UA neg, CT abd/pelvis: shows constipation and a small renal cell carcinoma that is known (just being followed at the moment as it hasnt done much for years). COVID and flu neg. Started on empiric cefepime, flagyl, vanc.  Patient admitted for further management.     Patient seen and examined at bedside, husband at bedside, all questions answered.  Patient denies any new complaints, denies any chest pain, worsening shortness of breath, abdominal pain, nausea/vomiting, fever/chills.  Noted some orthostatic hypotension, with desaturation to the 79% on room air while participating with PT.    Assessment/Plan: Principal Problem:   Sepsis ([place]) Active Problems:   Multiple sclerosis, 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   Chronic constipation with overflow incontinence   Permanent atrial fibrillation (HCC)   Obstructive sleep apnea treated with bilevel positive airway pressure (BiPAP)   Type 2 diabetes mellitus without complication, without long-term current use of insulin (HCC)   Acute on chronic respiratory failure with hypoxia  (HCC)   Acute metabolic encephalopathy   Sepsis likely 2/2 possible ?UTI- Pt completed keflex course and has been on suppressive macrobid Febrile, tachycardic on initial presentation  Currently afebrile, with no leukocytosis UA unremarkable, UC showed 70,000 multiple species BC x2 NGTD Procalcitonin negative Lactic acid negative Covid/flu negative Chest x-ray showed mild right base atelectasis, no edema or airspace opacity noted S/P IV fluids Continue cefepime, d/c vancomycin, Flagyl  Acute respiratory failure with hypoxia EMS noted sats 70% on room air Currently requiring about 2 L of O2, saturating above 90, dropped sats to 79% on room air on 06/06/2020 Chest x-ray unremarkable for any infection Covid/flu negative Continue supplemental oxygen  History of hypotension Orthostatic hypotension Reports hypotension since diagnosis of Covid last year S/p IV fluids due to noted edema Resume home midodrine  Acute metabolic encephalopathy Resolved  Permanent A. 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   Weight as  of an earlier encounter on 06/04/20: 85 kg.     Code Status: Full  Family Communication: Discussed extensively with husband at bedside on 06/06/2020  Disposition Plan: Status is: Inpatient  Remains inpatient  appropriate because:Inpatient level of care appropriate due to severity of illness   Dispo: The patient is from: Home              Anticipated d/c is to: Home              Anticipated d/c date is: 2 days              Patient currently is not medically stable to d/c.   Consultants: None  Procedures:  None  Antimicrobials:  Cefepime    DVT prophylaxis: Coumadin   Objective: Vitals:   06/06/20 0012 06/06/20 0411 06/06/20 0850 06/06/20 1115  BP: (!) 102/58 (!) 97/54 93/60 (!) 99/56  Pulse: 96 98  85  Resp: 13 18  18   Temp: 99.7 F (37.6 C) 98 F (36.7 C)  98.3 F (36.8 C)  TempSrc: Oral Oral  Oral  SpO2: 93% 94%  93%    Intake/Output Summary (Last 24 hours) at 06/06/2020 1228 Last data filed at 06/06/2020 0657 Gross per 24 hour  Intake --  Output 1900 ml  Net -1900 ml   There were no vitals filed for this visit.  Exam:  General: NAD   Cardiovascular: S1, S2 present  Respiratory:  Diminished breath sounds bilaterally  Abdomen: Soft, nontender, nondistended, bowel sounds present  Musculoskeletal: 1+ bilateral pedal edema noted  Skin: Normal  Psychiatry: Normal mood    Data Reviewed: CBC: Recent Labs  Lab 06/04/20 0052 06/04/20 1911 06/05/20 0035 06/06/20 0132  WBC 11.2* 10.4 8.7 8.1  NEUTROABS 7.5 7.6  --  5.2  HGB 12.4 11.8* 11.5* 11.1*  HCT 38.3 36.4 35.2* 34.0*  MCV 95.0 96.6 98.1 96.3  PLT 165 142* 130* 009   Basic Metabolic Panel: Recent Labs  Lab 06/04/20 0052 06/04/20 1911 06/05/20 0035 06/06/20 0132  NA 138 139 139 140  K 4.3 4.0 3.7 4.3  CL 100 100 101 103  CO2 32 30 29 33*  GLUCOSE 101* 79 138* 89  BUN 15 13 12 13   CREATININE 0.76 0.39* 0.48 0.45  CALCIUM 8.6* 8.4* 8.3* 8.1*   GFR: Estimated Creatinine Clearance: 64.7 mL/min (by C-G formula based on SCr of 0.45 mg/dL). Liver Function Tests: Recent Labs  Lab 06/04/20 0052 06/04/20 1911 06/05/20 0035  AST 15 13* 15  ALT 18 15 15   ALKPHOS 45 42 37*  BILITOT  0.3 0.9 0.9  PROT 5.7* 5.1* 4.8*  ALBUMIN 3.1* 2.7* 2.6*   No results for input(s): LIPASE, AMYLASE in the last 168 hours. Recent Labs  Lab 06/04/20 1911  AMMONIA 20   Coagulation Profile: Recent Labs  Lab 06/03/20 0000 06/04/20 0052 06/04/20 1911 06/05/20 0035 06/06/20 0132  INR 5.3* 4.5* 2.7* 2.6* 2.7*   Cardiac Enzymes: No results for input(s): CKTOTAL, CKMB, CKMBINDEX, TROPONINI in the last 168 hours. BNP (last 3 results) No results for input(s): PROBNP in the last 8760 hours. HbA1C: No results for input(s): HGBA1C in the last 72 hours. CBG: No results for input(s): GLUCAP in the last 168 hours. Lipid Profile: No results for input(s): CHOL, HDL, LDLCALC, TRIG, CHOLHDL, LDLDIRECT in the last 72 hours. Thyroid Function Tests: No results for input(s): TSH, T4TOTAL, FREET4, T3FREE, THYROIDAB in the last 72 hours. Anemia Panel: No results for input(s): VITAMINB12, FOLATE, FERRITIN,  TIBC, IRON, RETICCTPCT in the last 72 hours. Urine analysis:    Component Value Date/Time   COLORURINE YELLOW 06/04/2020 0320   APPEARANCEUR CLEAR 06/04/2020 0320   APPEARANCEUR Clear 12/18/2014 1156   LABSPEC 1.005 06/04/2020 0320   PHURINE 7.0 06/04/2020 0320   GLUCOSEU NEGATIVE 06/04/2020 0320   HGBUR NEGATIVE 06/04/2020 0320   BILIRUBINUR NEGATIVE 06/04/2020 0320   BILIRUBINUR Negative 12/18/2014 1156   KETONESUR NEGATIVE 06/04/2020 0320   PROTEINUR NEGATIVE 06/04/2020 0320   UROBILINOGEN 0.2 08/05/2014 0951   NITRITE NEGATIVE 06/04/2020 0320   LEUKOCYTESUR SMALL (A) 06/04/2020 0320   Sepsis Labs: @LABRCNTIP (procalcitonin:4,lacticidven:4)  ) Recent Results (from the past 240 hour(s))  Blood Culture (routine x 2)     Status: None (Preliminary result)   Collection Time: 06/04/20 12:50 AM   Specimen: BLOOD  Result Value Ref Range Status   Specimen Description BLOOD LEFT ANTECUBITAL  Final   Special Requests   Final    BOTTLES DRAWN AEROBIC AND ANAEROBIC Blood Culture adequate  volume   Culture   Final    NO GROWTH 2 DAYS Performed at [facility], [place] [address], [place], [place] [address]    Report Status PENDING  Incomplete  Respiratory Panel by RT PCR (Flu A&B, Covid) - Nasopharyngeal Swab     Status: None   Collection Time: 06/04/20 12:52 AM   Specimen: Nasopharyngeal Swab  Result Value Ref Range Status   SARS Coronavirus 2 by RT PCR NEGATIVE NEGATIVE Final    Comment: (NOTE) SARS-CoV-2 target nucleic acids are NOT DETECTED.  The SARS-CoV-2 RNA is generally detectable in upper respiratoy specimens during the acute phase of infection. The lowest concentration of SARS-CoV-2 viral copies this assay can detect is 131 copies/mL. A negative result does not preclude SARS-Cov-2 infection and should not be used as the sole basis for treatment or other patient management decisions. A negative result may occur with  improper specimen collection/handling, submission of specimen other than nasopharyngeal swab, presence of viral mutation(s) within the areas targeted by this assay, and inadequate number of viral copies (<131 copies/mL). A negative result must be combined with clinical observations, patient history, and epidemiological information. The expected result is Negative.  Fact Sheet for Patients:  PinkCheek.be  Fact Sheet for Healthcare Providers:  GravelBags.it  This test is no t yet approved or cleared by the [place] FDA and  has been authorized for detection and/or diagnosis of SARS-CoV-2 by FDA under an Emergency Use Authorization (EUA). This EUA will remain  in effect (meaning this test can be used) for the duration of the COVID-19 declaration under Section 564(b)(1) of the Act, 21 U.S.C. section 360bbb-3(b)(1), unless the authorization is terminated or revoked sooner.     Influenza A by PCR NEGATIVE NEGATIVE Final   Influenza B by PCR NEGATIVE NEGATIVE Final    Comment:  (NOTE) The Xpert Xpress SARS-CoV-2/FLU/RSV assay is intended as an aid in  the diagnosis of influenza from Nasopharyngeal swab specimens and  should not be used as a sole basis for treatment. Nasal washings and  aspirates are unacceptable for Xpert Xpress SARS-CoV-2/FLU/RSV  testing.  Fact Sheet for Patients: PinkCheek.be  Fact Sheet for Healthcare Providers: GravelBags.it  This test is not yet approved or cleared by the [place] FDA and  has been authorized for detection and/or diagnosis of SARS-CoV-2 by  FDA under an Emergency Use Authorization (EUA). This EUA will remain  in effect (meaning this test can be used) for the duration of  the  Covid-19 declaration under Section 564(b)(1) of the Act, 21  U.S.C. section 360bbb-3(b)(1), unless the authorization is  terminated or revoked. Performed at [facility], [place] [address], [place], [place] [address]   Blood Culture (routine x 2)     Status: None (Preliminary result)   Collection Time: 06/04/20 12:54 AM   Specimen: BLOOD RIGHT HAND  Result Value Ref Range Status   Specimen Description BLOOD RIGHT HAND  Final   Special Requests   Final    BOTTLES DRAWN AEROBIC AND ANAEROBIC Blood Culture results may not be optimal due to an inadequate volume of blood received in culture bottles   Culture   Final    NO GROWTH 2 DAYS Performed at [facility], [place] [address], [place], [place] [address]    Report Status PENDING  Incomplete  Urine culture     Status: Abnormal   Collection Time: 06/04/20  3:13 AM   Specimen: In/Out Cath Urine  Result Value Ref Range Status   Specimen Description IN/OUT CATH URINE  Final   Special Requests   Final    NONE Performed at [facility], [place] [address], [place], [place] [address]    Culture (A)  Final    70,000 COLONIES/mL MULTIPLE SPECIES PRESENT, SUGGEST RECOLLECTION   Report Status 06/05/2020 FINAL  Final  MRSA PCR  Screening     Status: None   Collection Time: 06/04/20 11:53 PM   Specimen: Nasopharyngeal  Result Value Ref Range Status   MRSA by PCR NEGATIVE NEGATIVE Final    Comment:        The GeneXpert MRSA Assay (FDA approved for NASAL specimens only), is one component of a comprehensive MRSA colonization surveillance program. It is not intended to diagnose MRSA infection nor to guide or monitor treatment for MRSA infections. Performed at [facility], [place] [address], [place], [place] [address]       Studies: No results found.  Scheduled Meds: . acetaminophen  650 mg Rectal Once  . buPROPion  300 mg Oral Daily  . famotidine  20 mg Oral QHS  . gabapentin  300 mg Oral Q6H  . lactulose  10 g Oral Daily  . levothyroxine  75 mcg Oral QAC breakfast  . linaclotide  145 mcg Oral QAC breakfast  . loratadine  10 mg Oral Daily  . melatonin  5 mg Oral QHS  . midodrine  5 mg Oral TID WC  . ondansetron  8 mg Oral q morning - 10a  . pantoprazole  40 mg Oral Daily  . psyllium  1 packet Oral QPM  . simvastatin  10 mg Oral QPM  . tamsulosin  0.4 mg Oral q AM  . warfarin  2.5 mg Oral ONCE-1600  . Warfarin - Pharmacist Dosing Inpatient   Does not apply q1600    Continuous Infusions: . amiodarone 30 mg/hr (06/06/20 0404)  . ceFEPime (MAXIPIME) IV 2 g (06/06/20 1200)     LOS: 2 days     Alma Friendly, MD [facility]  If 7PM-7AM, please contact night-coverage [email or web address] 06/06/2020, 12:28 PM

## 2020-06-06 NOTE — Evaluation (Addendum)
Occupational Therapy Evaluation Patient Details Name: Toni [name] MRN: [id number] DOB: 1946/06/28 Today's Date: 06/06/2020    History of Present Illness 74 y.o. female with medical history significant for persistent A. fib, hypertension, hyperlipidemia, DVT, MS, OSA, COVID 12/20 presents to ED for acute onset, persistent and progressively worsening abdominal pain and distention. In ED noted to have A fib with RVR. Admitted 04/06/20 for treatment of Afib with RVR and UTI.   Clinical Impression   PTA, pt was living with her husband and has a 24 hour caregiver who assists with ADLs and lateral scoots to w/c; pt has [place] therapy as well. Pt currently requiring Min A for UB ADLs, Max A for LB ADLs, and Min A +2 for lateral scoot to drop arm recliner. Pt presenting with decreased strength and activity tolerance. HR elevating to 156 and SpO2 80s on RA; SpO2 90s on 2L and notified RN. Provided red and blue built up tubing for grooming and self feeding. Pt would benefit from further acute OT to facilitate safe dc. Recommend dc to home with HHOT for further OT to optimize safety, independence with ADLs, and return to PLOF.     Follow Up Recommendations  Home health OT;Supervision/Assistance - 24 hour    Equipment Recommendations  None recommended by OT    Recommendations for Other Services PT consult     Precautions / Restrictions Precautions Precautions: Fall;Other (comment) (Watch HR, BP, and Spo2)      Mobility Bed Mobility Overal bed mobility: Needs Assistance Bed Mobility: Supine to Sit     Supine to sit: Min assist;HOB elevated     General bed mobility comments: Pt reaching for bedrail and pulling hips and trunk towards EOB. Min A for bring hips towards EOB with bed pad  Transfers Overall transfer level: Needs assistance Equipment used: Sliding board Transfers: Lateral/Scoot Transfers          Lateral/Scoot Transfers: Min assist;+2 physical assistance;+2  safety/equipment;With slide board General transfer comment: Min A +2 for lateral scoot to drop arm recliner.     Balance Overall balance assessment: Needs assistance Sitting-balance support: No upper extremity supported;Feet supported Sitting balance-Leahy Scale: Fair                                     ADL either performed or assessed with clinical judgement   ADL Overall ADL's : Needs assistance/impaired Eating/Feeding: Set up;Supervision/ safety;Sitting Eating/Feeding Details (indicate cue type and reason): Requiring assist to open containers. Providing built up red tubing for utensils.  Grooming: Brushing hair;Supervision/safety;Set up;Sitting;Wash/dry face   Upper Body Bathing: Minimal assistance;Sitting   Lower Body Bathing: Maximal assistance;Sitting/lateral leans   Upper Body Dressing : Minimal assistance;Sitting   Lower Body Dressing: Maximal assistance;Sitting/lateral leans   Toilet Transfer: Minimal assistance;+2 for physical assistance;+2 for safety/equipment;Transfer board (simulated to drop arm recliner)           Functional mobility during ADLs: Minimal assistance;+2 for physical assistance;+2 for safety/equipment General ADL Comments: Pt very agreeable to therapy. Demonstrating decreased strength and activity tolerance. Very eager to dc to home with husband and caregiver as well as resume HH therapy     Vision Baseline Vision/History: Wears glasses Wears Glasses: Distance only Patient Visual Report: No change from baseline       Perception     Praxis      Pertinent Vitals/Pain Pain Assessment: Faces Faces Pain Scale: No hurt Pain Intervention(s):  Monitored during session     Hand Dominance Right   Extremity/Trunk Assessment Upper Extremity Assessment Upper Extremity Assessment: Generalized weakness;RUE deficits/detail;LUE deficits/detail RUE Deficits / Details: Increased edema. Decreased grasp strength and FM coorindation. WFL for  gross motor coorindation.  RUE Coordination: decreased fine motor LUE Deficits / Details: Increased edema. Decreased grasp strength and FM coorindation. WFL for gross motor coorindation.  LUE Coordination: decreased fine motor   Lower Extremity Assessment Lower Extremity Assessment: Defer to PT evaluation   Cervical / Trunk Assessment Cervical / Trunk Assessment: Other exceptions Cervical / Trunk Exceptions: Increased body habitus   Communication Communication Communication: No difficulties   Cognition Arousal/Alertness: Awake/alert Behavior During Therapy: WFL for tasks assessed/performed Overall Cognitive Status: Within Functional Limits for tasks assessed                                     General Comments  HR elevating to 156. Spo2 dropping to 81% on RA duirng activity; placed back on 2L and recovering to 90s. BP low; notified RN    Exercises     Shoulder Instructions      Home Living Family/patient expects to be discharged to:: Private residence Living Arrangements: Spouse/significant other;Other (Comment) (24/7 caregiver) Available Help at Discharge: Family;Available 24 hours/day;Personal care attendant Type of Home: House Home Access: Ramped entrance     Home Layout: Two level;Able to live on main level with bedroom/bathroom     Bathroom Shower/Tub: Occupational psychologist: Handicapped height Bathroom Accessibility: Yes How Accessible: Accessible via wheelchair Home Equipment: Bedside commode;Shower seat - built in;Grab bars - tub/shower;Grab bars - toilet;Hand held shower head;Hospital bed;Wheelchair - manual;Other (comment) (drop arm BSC; sit<>stand chair)   Additional Comments: Hired assist 7 days a week; sit <> stand lift      Prior Functioning/Environment Level of Independence: Needs assistance  Gait / Transfers Assistance Needed: sliding transfers to w/c, BSC ADL's / Homemaking Assistance Needed: able to dress her upper body,  had assist for briefs and pants. Uses purewick and briefs for incontinence management. Aide assists with bathing. Husband/aide assists with IADLs   Comments: able to mobilize with w/c         OT Problem List: Decreased strength;Decreased range of motion;Decreased activity tolerance;Impaired balance (sitting and/or standing);Decreased knowledge of use of DME or AE;Decreased knowledge of precautions;Impaired UE functional use;Cardiopulmonary status limiting activity;Obesity;Increased edema      OT Treatment/Interventions:      OT Goals(Current goals can be found in the care plan section) Acute Rehab OT Goals Patient Stated Goal: Go home to start therapy again OT Goal Formulation: With patient Time For Goal Achievement: 06/20/20 Potential to Achieve Goals: Good  OT Frequency: Min 2X/week   Barriers to D/C:            Co-evaluation              AM-PAC OT "6 Clicks" Daily Activity     Outcome Measure Help from another person eating meals?: A Little Help from another person taking care of personal grooming?: A Little Help from another person toileting, which includes using toliet, bedpan, or urinal?: A Lot Help from another person bathing (including washing, rinsing, drying)?: A Lot Help from another person to put on and taking off regular upper body clothing?: A Little Help from another person to put on and taking off regular lower body clothing?: A Lot 6 Click Score: 15  End of Session Equipment Utilized During Treatment: Other (comment);Oxygen (Sliding board) Nurse Communication: Mobility status;Other (comment) (Placed on 2L O2)  Activity Tolerance: Patient tolerated treatment well Patient left: in chair;with call bell/phone within reach  OT Visit Diagnosis: Unsteadiness on feet (R26.81);Other abnormalities of gait and mobility (R26.89);Muscle weakness (generalized) (M62.81)                Time: 0871-9941 OT Time Calculation (min): 42 min Charges:  OT General  Charges $OT Visit: 1 Visit OT Evaluation $OT Eval Moderate Complexity: 1 Mod OT Treatments $Self Care/Home Management : 8-22 mins  Filbert Craze MSOT, OTR/L Acute Rehab Pager: [phone] Office: [place] 06/06/2020, 8:51 AM

## 2020-06-07 ENCOUNTER — Ambulatory Visit: Payer: Medicare Other | Admitting: Gastroenterology

## 2020-06-07 ENCOUNTER — Telehealth: Payer: Self-pay | Admitting: Pulmonary Disease

## 2020-06-07 DIAGNOSIS — K5909 Other constipation: Secondary | ICD-10-CM | POA: Diagnosis not present

## 2020-06-07 DIAGNOSIS — A419 Sepsis, unspecified organism: Secondary | ICD-10-CM | POA: Diagnosis not present

## 2020-06-07 DIAGNOSIS — J9621 Acute and chronic respiratory failure with hypoxia: Secondary | ICD-10-CM | POA: Diagnosis not present

## 2020-06-07 DIAGNOSIS — G9341 Metabolic encephalopathy: Secondary | ICD-10-CM | POA: Diagnosis not present

## 2020-06-07 LAB — CBC WITH DIFFERENTIAL/PLATELET
Abs Immature Granulocytes: 0.02 10*3/uL (ref 0.00–0.07)
Basophils Absolute: 0 10*3/uL (ref 0.0–0.1)
Basophils Relative: 0 %
Eosinophils Absolute: 0.4 10*3/uL (ref 0.0–0.5)
Eosinophils Relative: 6 %
HCT: 33.6 % — ABNORMAL LOW (ref 36.0–46.0)
Hemoglobin: 10.7 g/dL — ABNORMAL LOW (ref 12.0–15.0)
Immature Granulocytes: 0 %
Lymphocytes Relative: 21 %
Lymphs Abs: 1.4 10*3/uL (ref 0.7–4.0)
MCH: 31.4 pg (ref 26.0–34.0)
MCHC: 31.8 g/dL (ref 30.0–36.0)
MCV: 98.5 fL (ref 80.0–100.0)
Monocytes Absolute: 1 10*3/uL (ref 0.1–1.0)
Monocytes Relative: 14 %
Neutro Abs: 4.1 10*3/uL (ref 1.7–7.7)
Neutrophils Relative %: 59 %
Platelets: 174 10*3/uL (ref 150–400)
RBC: 3.41 MIL/uL — ABNORMAL LOW (ref 3.87–5.11)
RDW: 13.2 % (ref 11.5–15.5)
WBC: 6.9 10*3/uL (ref 4.0–10.5)
nRBC: 0 % (ref 0.0–0.2)

## 2020-06-07 LAB — PROTIME-INR
INR: 1.9 — ABNORMAL HIGH (ref 0.8–1.2)
Prothrombin Time: 21.2 seconds — ABNORMAL HIGH (ref 11.4–15.2)

## 2020-06-07 LAB — BASIC METABOLIC PANEL
Anion gap: 9 (ref 5–15)
BUN: 14 mg/dL (ref 8–23)
CO2: 29 mmol/L (ref 22–32)
Calcium: 8.1 mg/dL — ABNORMAL LOW (ref 8.9–10.3)
Chloride: 105 mmol/L (ref 98–111)
Creatinine, Ser: 0.48 mg/dL (ref 0.44–1.00)
GFR, Estimated: >60 mL/min (ref >60)
Glucose, Bld: 91 mg/dL (ref 70–99)
Potassium: 4 mmol/L (ref 3.5–5.1)
Sodium: 143 mmol/L (ref 135–145)

## 2020-06-07 LAB — HEMOGLOBIN A1C
Hgb A1c MFr Bld: 4.9 % (ref 4.8–5.6)
Mean Plasma Glucose: 94 mg/dL

## 2020-06-07 LAB — BRAIN NATRIURETIC PEPTIDE: B Natriuretic Peptide: 119.7 pg/mL — ABNORMAL HIGH (ref 0.0–100.0)

## 2020-06-07 MED ORDER — WARFARIN SODIUM 2 MG PO TABS
4.0000 mg | ORAL_TABLET | Freq: Once | ORAL | Status: AC
  Administered 2020-06-07: 4 mg via ORAL
  Filled 2020-06-07: qty 2

## 2020-06-07 MED ORDER — FUROSEMIDE 10 MG/ML IJ SOLN
20.0000 mg | Freq: Once | INTRAMUSCULAR | Status: AC
  Administered 2020-06-07: 20 mg via INTRAVENOUS
  Filled 2020-06-07: qty 2

## 2020-06-07 MED ORDER — DILTIAZEM HCL ER COATED BEADS 120 MG PO CP24
120.0000 mg | ORAL_CAPSULE | Freq: Every day | ORAL | Status: DC
  Administered 2020-06-07: 120 mg via ORAL
  Filled 2020-06-07: qty 1

## 2020-06-07 NOTE — Progress Notes (Signed)
ANTICOAGULATION CONSULT NOTE - Consult  Pharmacy Consult for warfarin Indication: atrial fibrillation  Allergies  Allergen Reactions  . Chlorhexidine Hives and Other (See Comments)    And, sores appear where applied  . Hydrocodone Nausea And Vomiting and Other (See Comments)    Hydrocodone causes vomiting  . Oxycodone Nausea And Vomiting and Other (See Comments)    Oral oxycodone causes vomiting  . Zoloft [Sertraline] Hives, Swelling and Rash    Patient Measurements:    Vital Signs: Temp: 97.7 F (36.5 C) (10/18 0829) Temp Source: Oral (10/18 0829) BP: 105/64 (10/18 0829) Pulse Rate: 75 (10/18 0829)  Labs: Recent Labs    06/04/20 1911 06/04/20 1911 06/05/20 0035 06/05/20 0035 06/06/20 0132 06/07/20 0409  HGB 11.8*   < > 11.5*   < > 11.1* 10.7*  HCT 36.4   < > 35.2*  --  34.0* 33.6*  PLT 142*   < > 130*  --  158 174  APTT 43*  --   --   --   --   --   LABPROT 27.7*   < > 26.8*  --  27.4* 21.2*  INR 2.7*   < > 2.6*  --  2.7* 1.9*  CREATININE 0.39*   < > 0.48  --  0.45 0.48   < > = values in this interval not displayed.    Estimated Creatinine Clearance: 64.7 mL/min (by C-G formula based on SCr of 0.48 mg/dL).   Medical History: Past Medical History:  Diagnosis Date  . Abnormality of gait 03/01/2015  . Anxiety   . Arthritis    "knees" (01/19/2016)  . Asthma    as a child   . Back pain   . Cancer ([place])    / RENAL CELL CARCINOMA - GETS CT SCAN EVERY 6 MONTHS TO WATCH   . CHF (congestive heart failure) ([place])   . Chronic pain    "nerve pain from the MS"  . Complex atypical endometrial hyperplasia   . Constipation   . Depression   . Diabetes mellitus without complication ([place])   . DVT (deep venous thrombosis) ([place]) 1983   "RLE; may have just been phlebitis; it was before the age of dopplers"  . Dysrhythmia    afib   . 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 GERD (gastroesophageal reflux disease)   .  HLD (hyperlipidemia)   . Hypertension   . Hypotension   . Hypothyroidism   . Joint pain   . Multiple sclerosis ([place])    has had this may 1989-[place] reg doc  . OSA on CPAP    bipap machine - SLEEP APNEA WORSE SINCE COVID IN 1/21 PER PATIENT SETTING HAS INCREASED FROM 9 TO 30   . Osteoarthritis   . PAF (paroxysmal atrial fibrillation) ([place])    on coumadin; documented on monitor 06/2010  . Pneumonia 11/2011  . Sleep apnea    Assessment: 38 YOF presenting with palpitations, history of Afib on anticoagulation with warfarin. Her last dose prior to admission was on 10/13. She did not receive warfarin on 10/14 or 10/15.   Today, INR dropped to 1.9 - slightly sub-therapeutic for goal after low dose of 2.5mg  x1 yesterday, likely seeing drop due to two missed doses on 10/14 and 10/15. Her warfarin requirements are likely decreased from home needs due to acute illness, antibiotic therapy (metronidazole), and amiodarone was started 10/16.  Good appetite  noted on cardiac diet - unsure of % intake.   PTA dosing: 5mg  daily except Monday/Friday 2.5mg   Drug interactions: started Flagyl 10/15, currently on an amiodarone drip as well  Goal of Therapy:  INR 2-3 Monitor platelets by anticoagulation protocol: Yes   Plan:  Warfarin 4mg  po x1 tonight Monitor daily INR, CBC, clinical course, s/sx of bleed, drug interactions   Sloan Leiter, PharmD, BCPS, BCCCP Clinical Pharmacist Please refer to [facility] for [place] numbers 06/07/2020, 10:41 AM

## 2020-06-07 NOTE — Progress Notes (Signed)
Consult received for PIV start. Patient currently has PIV and medications are compatible to infuse together. Nurse notified. VU. Fran Lowes, RN VAST

## 2020-06-07 NOTE — Progress Notes (Signed)
RT placed patient on CPAP with 3L O2 bled into circuit. Patient tolerating settings well at this time. No respiratory distress noted. RT will monitor as needed.

## 2020-06-07 NOTE — Progress Notes (Signed)
Physical Therapy Treatment Patient Details Name: Toni [name] MRN: [id number] DOB: 1945/11/19 Today's Date: 06/07/2020    History of Present Illness Pt is a 74 y.o. female with PMH of persistent A. fib, hypertension, HLD, DVT, MS, OSA, COVID (07/2019) presents to ED 06/04/20 for acute onset, persistent and progressively worsening abdominal pain and distention. Workup for Afib with RVR, UTI, sepsis, acute respiratory failure.    PT Comments    Pt supine on arrival with personal aide present. Pt reporting fatigue from morning activities of bathing and such with pt denial to get OOB due to fatigue at this time. Pt able to transition to sitting and perform bil LE and UE HEP with theraband. Pt returned to bed for bedpan end of session. Will continue to work toward increased transfers and strength.  HR 85 BP in sitting 98/79 (84) SpO2 96% on 1L    Follow Up Recommendations  Home health PT;Supervision/Assistance - 24 hour     Equipment Recommendations  None recommended by PT    Recommendations for Other Services       Precautions / Restrictions Precautions Precautions: Fall;Other (comment) Precaution Comments: Watch BP and SPO2    Mobility  Bed Mobility Overal bed mobility: Needs Assistance Bed Mobility: Supine to Sit;Sit to Supine     Supine to sit: Min assist;HOB elevated Sit to supine: Min assist   General bed mobility comments: HOB 15 degrees with min assist to shift hips fully toward EOB and reliance on rail to sit. return to bed with assist to move RLe onto surface and max +2 to slide toward [place] transfer comment: pt deferred transfer OOB due to fatigue. Mod assist with pad to slide toward [facility] with cues and increased time  Ambulation/Gait                 Stairs             Wheelchair Mobility    Modified Rankin (Stroke Patients Only)       Balance Overall balance assessment: Needs  assistance Sitting-balance support: No upper extremity supported;Feet supported Sitting balance-Leahy Scale: Good                                      Cognition Arousal/Alertness: Awake/alert Behavior During Therapy: WFL for tasks assessed/performed Overall Cognitive Status: Within Functional Limits for tasks assessed                                        Exercises General Exercises - Upper Extremity Shoulder Flexion: AROM;Both;Seated;Theraband;15 reps Theraband Level (Shoulder Flexion): Level 2 (Red) Shoulder ABduction: AROM;Both;10 reps;Theraband Theraband Level (Shoulder Abduction): Level 2 (Red) General Exercises - Lower Extremity Long Arc Quad: AROM;Both;Seated;15 reps Hip Flexion/Marching: AROM;Both;15 reps;Seated Toe Raises: AROM;Both;Seated;15 reps Heel Raises: AROM;Both;Seated;15 reps    General Comments        Pertinent Vitals/Pain Pain Assessment: No/denies pain    Home Living                      Prior Function            PT Goals (current goals can now be found in the care plan section) Progress towards PT goals:  Progressing toward goals    Frequency    Min 3X/week      PT Plan Current plan remains appropriate    Co-evaluation              AM-PAC PT "6 Clicks" Mobility   Outcome Measure  Help needed turning from your back to your side while in a flat bed without using bedrails?: A Little Help needed moving from lying on your back to sitting on the side of a flat bed without using bedrails?: A Little Help needed moving to and from a bed to a chair (including a wheelchair)?: A Little Help needed standing up from a chair using your arms (e.g., wheelchair or bedside chair)?: Total Help needed to walk in hospital room?: Total Help needed climbing 3-5 steps with a railing? : Total 6 Click Score: 12    End of Session Equipment Utilized During Treatment: Oxygen Activity Tolerance: Patient tolerated  treatment well Patient left: in bed;with call bell/phone within reach;with family/visitor present Nurse Communication: Mobility status PT Visit Diagnosis: Other abnormalities of gait and mobility (R26.89);Muscle weakness (generalized) (M62.81)     Time: 4967-5916 PT Time Calculation (min) (ACUTE ONLY): 29 min  Charges:  $Therapeutic Exercise: 8-22 mins $Therapeutic Activity: 8-22 mins                     Anastazja Isaac P, PT Acute Rehabilitation Services Pager: [phone] Office: [place] 06/07/2020, 1:24 PM

## 2020-06-07 NOTE — Progress Notes (Signed)
  Pharmacy Antibiotic Note  Toni [name] is a 74 y.o. female admitted on 06/04/2020 with sepsis.  Pharmacy has been consulted for cefepime dosing. Pt is also receiving flagyl per primary team. Vancomycin was discontinued on 10/17 with negative MRSA PCR. Note patient has a history of recurrent UTIs.    This is day 4 of therapy. Patient remains afebrile and WBC is down to within normal limits. LA is normal. Procalcitonin is negative. Blood cultures were cancelled - no available culture information. SCr is stable.   Plan: Continue current dose of Cefepime 2g IV q8h  Monitor cbc, clinical progress, micro data, and renal function  Follow-up for length of therapy - consider 5 day stop date     Temp (24hrs), Avg:98.2 F (36.8 C), Min:97.7 F (36.5 C), Max:98.7 F (37.1 C)  Recent Labs  Lab 06/04/20 0052 06/04/20 0317 06/04/20 1911 06/05/20 0035 06/06/20 0132 06/07/20 0409  WBC 11.2*  --  10.4 8.7 8.1 6.9  CREATININE 0.76  --  0.39* 0.48 0.45 0.48  LATICACIDVEN 0.9 1.0 0.6  --   --   --     Estimated Creatinine Clearance: 64.7 mL/min (by C-G formula based on SCr of 0.48 mg/dL).    Allergies  Allergen Reactions  . Chlorhexidine Hives and Other (See Comments)    And, sores appear where applied  . Hydrocodone Nausea And Vomiting and Other (See Comments)    Hydrocodone causes vomiting  . Oxycodone Nausea And Vomiting and Other (See Comments)    Oral oxycodone causes vomiting  . Zoloft [Sertraline] Hives, Swelling and Rash    Antimicrobials this admission: vanc 10/15>10/17 Cefepime 10/15> Flagyl x1 10/15   Microbiology results: 10/15 UCx sent 10/15 bcx sent 10/15 resp panel negative 10/15 MRSA PCR ordered  Thank you for allowing pharmacy to be a part of this patient's care.  Sloan Leiter, PharmD, BCPS, BCCCP Clinical Pharmacist Please refer to [facility] for [place] numbers 06/07/2020 10:45 AM

## 2020-06-07 NOTE — TOC Initial Note (Signed)
Transition of Care [facility]) - Initial/Assessment Note    Patient Details  Name: Toni [name] MRN: [id number] Date of Birth: Jul 11, 1946  [facility]) CM/SW Contact:    Joanne Chars, LCSW Phone Number: 06/07/2020, 4:03 PM  Clinical Narrative:    CSW met with pt regarding DC plan.  Pt lives at home with husband and full time live in aide, [facility] already in place and pt happy with them and wants to continue.  PCP in place.  Permission given to speak with daughter, husband, caregiver.  No equipment needs identified, pt has hospital bed, wheelchair, lift, 2 3n1s, walker.                 Expected Discharge Plan: [place] Barriers to Discharge: Continued Medical Work up   Patient Goals and CMS Choice Patient states their goals for this hospitalization and ongoing recovery are:: better mobility CMS Medicare.gov Compare Post Acute Care list provided to::  ([facility] already in place and pt would like to stay with them)    Expected Discharge Plan and Services Expected Discharge Plan: [place] Choice: [place] arrangements for the past 2 months: Single Family Home                           HH Arranged: PT, OT HH Agency: [place] ([facility]) Date [place]: 06/07/20 Time HH Agency Contacted: 1230 Representative spoke with at [place]: [place]  Prior Living Arrangements/Services Living arrangements for the past 2 months: [place] with:: Spouse, Other (Comment) (live in aide) Patient language and need for interpreter reviewed:: Yes Do you feel safe going back to the place where you live?: Yes      Need for Family Participation in Patient Care: Yes (Comment) Care giver support system in place?: Yes (comment) Current home services: Home PT, Home OT Criminal Activity/Legal Involvement Pertinent to Current Situation/Hospitalization: No - Comment as  needed  Activities of Daily Living      Permission Sought/Granted Permission sought to share information with : Family Supports, Chartered certified accountant granted to share information with : Yes, Verbal Permission Granted  Share Information with NAME: Daughter Tera Helper, husband [name]  Permission granted to share info w AGENCY: [facility]        Emotional Assessment Appearance:: Appears stated age Attitude/Demeanor/Rapport: Engaged Affect (typically observed): Appropriate, Pleasant Orientation: : Oriented to Self, Oriented to Place, Oriented to  Time, Oriented to Situation Alcohol / Substance Use: Not Applicable Psych Involvement: No (comment)  Admission diagnosis:  Sepsis ([place]) [A41.9] Acute on chronic respiratory failure with hypoxia ([place]) [J96.21] Sepsis without acute organ dysfunction, due to unspecified organism [facility]) [A41.9] Patient Active Problem List   Diagnosis Date Noted  . Sepsis ([place]) 06/04/2020  . Acute metabolic encephalopathy 88/89/1694  . Atrial fibrillation with RVR ([place]) 04/07/2020  . UTI (urinary tract infection) 04/07/2020  . 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 Oxygen dependent 02/26/2020  . Neurogenic hypoventilation 02/26/2020  . Complex atypical endometrial hyperplasia   . Acute on chronic respiratory failure with hypoxia ([place]) 10/10/2019  . COVID-19 virus infection 08/23/2019  . Bilateral leg weakness 08/04/2019  . Weakness 08/04/2019  . Unilateral primary osteoarthritis, left knee 01/09/2019  . Type 2 diabetes mellitus without complication, without long-term current use of insulin ([place]) 08/27/2018  . Obstructive sleep apnea treated with  bilevel positive airway pressure (BiPAP) 08/15/2018  . 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 ([place]) 11/21/2017  . Permanent atrial fibrillation ([place]) 11/21/2017  . Tightness of leg fascia 11/21/2017  . Muscle spasm of both lower legs 11/21/2017  . Carpal tunnel  syndrome, left upper limb 09/13/2017  . Carpal tunnel syndrome, right upper limb 09/13/2017  . Other insomnia 06/11/2017  . Depression 06/11/2017  . Vitamin D deficiency 03/22/2017  . Right hand weakness 08/06/2016  . Nonischemic cardiomyopathy ([place]) 05/09/2016  . Localized edema 05/09/2016  . Long term current use of anticoagulant therapy 02/03/2016  . 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 Obesity hypoventilation syndrome ([place]) 03/30/2015  . Abnormality of gait 03/01/2015  . Chronic constipation with overflow incontinence 01/25/2015  . 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) ([place]) 01/25/2015  . 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 Varicose veins of both lower extremities with complications 53/91/2258  . Long term (current) use of anticoagulants 11/05/2012  . 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 Essential hypertension   . Thyroid disease   . HLD (hyperlipidemia)    PCP:  Prince Solian, MD Pharmacy:   [facility] [place] [place] [address] [place] [address] Phone: [phone] Fax: [phone]     Social Determinants of Health (SDOH) Interventions    Readmission Risk Interventions No flowsheet data found.

## 2020-06-07 NOTE — Telephone Encounter (Signed)
pt's daughter calling to let Dr. Ander Slade know that she will be cancelling her home sleep study due to being admitted to [facility] -

## 2020-06-07 NOTE — Progress Notes (Signed)
PROGRESS NOTE  Toni [name] IDP:[id number] DOB: April 06, 1946 DOA: 06/04/2020 PCP: Prince Solian, MD  HPI/Recap of past 24 hours: HPI from Dr Sonia Side is a 74 y.o. female with medical history significant of MS, a.fib on coumadin, OSA on BIPAP, uses oxygen with BIPAP at night only, frequent recurrent UTIs due to MS. Pt seen in ED earlier this am for palpitations, possible UTI.  Tm at that time was 99.9, was discharged. Went home, pt took nap, woke up and was confused per family.  EMS called.  O2 sat per EMS 70, Temp 100. Patient denies: Cough, SOB, CP, N/V, changes in bowel habits (has chronic constipation from MS). Has mild LLQ abd TTP. In the ED, Tm 101.3.  Initial BP 87/58.  Improves to 443 systolic after 30 ml/kg bolus (this about baseline per pt and daughter). AMS is resolved at this time. WBC 10.4k. was 11.2k this AM, INR 2.7. CXR neg, UA neg, CT abd/pelvis: shows constipation and a small renal cell carcinoma that is known (just being followed at the moment as it hasnt done much for years). COVID and flu neg. Started on empiric cefepime, flagyl, vanc.  Patient admitted for further management.     Patient seen and examined at bedside.  Reports feeling better, just reports some abdominal distention, denies any chest pain, shortness of breath, abdominal pain, nausea/vomiting, fever/chills.    Assessment/Plan: Principal Problem:   Sepsis ([place]) Active Problems:   Multiple sclerosis, 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   Chronic constipation with overflow incontinence   Permanent atrial fibrillation (HCC)   Obstructive sleep apnea treated with bilevel positive airway pressure (BiPAP)   Type 2 diabetes mellitus without complication, without long-term current use of insulin (HCC)   Acute on chronic respiratory failure with hypoxia (HCC)   Acute metabolic encephalopathy   Sepsis likely 2/2 possible ?UTI- Pt completed keflex course and has been on suppressive  macrobid Febrile, tachycardic on initial presentation  Currently afebrile, with no leukocytosis UA unremarkable, UC showed 70,000 multiple species BC x2 NGTD Procalcitonin negative Lactic acid negative Covid/flu negative Chest x-ray showed mild right base atelectasis, no edema or airspace opacity noted S/P IV fluids Continue cefepime, d/c vancomycin, Flagyl  Acute respiratory failure with hypoxia ??Acute on chronic diastolic HF-BNP pending EMS noted sats 70% on room air Currently requiring about 2 L of O2, saturating above 90 Chest x-ray unremarkable for any infection, or edema Covid/flu negative Continue supplemental oxygen  History of hypotension Orthostatic hypotension Reports hypotension since diagnosis of Covid last year S/p IV fluids due to noted edema Resume home midodrine  Acute metabolic encephalopathy Resolved  Permanent A. Fib with RVR Heart rate with better control S/p amiodarone drip, resumed p.o. Cardizem at a lower dose, may increase as BP tolerates Continue Coumadin-initially supratherapeutic on admission, currently subtherapeutic  Reports hx of central sleep apnea on BiPAP Continue BiPAP per home settings  Chronic constipation Continue bowel regimen  Multiple sclerosis Continue home management         Malnutrition Type:      Malnutrition Characteristics:      Nutrition Interventions:       Estimated body mass index is 33.19 kg/m as calculated from the following:   Height as of an earlier encounter on 06/04/20: 5\' 3"  (1.6 m).   Weight as of an earlier encounter on 06/04/20: 85 kg.     Code Status: Full  Family Communication: Discussed extensively with husband at bedside on 06/06/2020  Disposition Plan:  Status is: Inpatient  Remains inpatient appropriate because:Inpatient level of care appropriate due to severity of illness   Dispo: The patient is from: Home              Anticipated d/c is to: Home               Anticipated d/c date is: 2 days              Patient currently is not medically stable to d/c.   Consultants: None  Procedures:  None  Antimicrobials:  Cefepime    DVT prophylaxis: Coumadin   Objective: Vitals:   06/07/20 0347 06/07/20 0829 06/07/20 1201 06/07/20 1615  BP: (!) 106/57 105/64 119/66 (!) 114/56  Pulse: 78 75 85 90  Resp: 20 (!) 21 17 16   Temp: 98 F (36.7 C) 97.7 F (36.5 C) 98.4 F (36.9 C) 98.2 F (36.8 C)  TempSrc: Oral Oral Oral Oral  SpO2: 93% 98% 98% (!) 88%    Intake/Output Summary (Last 24 hours) at 06/07/2020 1818 Last data filed at 06/07/2020 0606 Gross per 24 hour  Intake --  Output 3400 ml  Net -3400 ml   There were no vitals filed for this visit.  Exam:  General: NAD   Cardiovascular: S1, S2 present  Respiratory:  Diminished breath sounds bilaterally  Abdomen: Soft, nontender, mildly distended, bowel sounds present  Musculoskeletal: 1+ bilateral pedal edema noted  Skin: Normal  Psychiatry: Normal mood    Data Reviewed: CBC: Recent Labs  Lab 06/04/20 0052 06/04/20 1911 06/05/20 0035 06/06/20 0132 06/07/20 0409  WBC 11.2* 10.4 8.7 8.1 6.9  NEUTROABS 7.5 7.6  --  5.2 4.1  HGB 12.4 11.8* 11.5* 11.1* 10.7*  HCT 38.3 36.4 35.2* 34.0* 33.6*  MCV 95.0 96.6 98.1 96.3 98.5  PLT 165 142* 130* 158 518   Basic Metabolic Panel: Recent Labs  Lab 06/04/20 0052 06/04/20 1911 06/05/20 0035 06/06/20 0132 06/07/20 0409  NA 138 139 139 140 143  K 4.3 4.0 3.7 4.3 4.0  CL 100 100 101 103 105  CO2 32 30 29 33* 29  GLUCOSE 101* 79 138* 89 91  BUN 15 13 12 13 14   CREATININE 0.76 0.39* 0.48 0.45 0.48  CALCIUM 8.6* 8.4* 8.3* 8.1* 8.1*   GFR: Estimated Creatinine Clearance: 64.7 mL/min (by C-G formula based on SCr of 0.48 mg/dL). Liver Function Tests: Recent Labs  Lab 06/04/20 0052 06/04/20 1911 06/05/20 0035  AST 15 13* 15  ALT 18 15 15   ALKPHOS 45 42 37*  BILITOT 0.3 0.9 0.9  PROT 5.7* 5.1* 4.8*  ALBUMIN 3.1*  2.7* 2.6*   No results for input(s): LIPASE, AMYLASE in the last 168 hours. Recent Labs  Lab 06/04/20 1911  AMMONIA 20   Coagulation Profile: Recent Labs  Lab 06/04/20 0052 06/04/20 1911 06/05/20 0035 06/06/20 0132 06/07/20 0409  INR 4.5* 2.7* 2.6* 2.7* 1.9*   Cardiac Enzymes: No results for input(s): CKTOTAL, CKMB, CKMBINDEX, TROPONINI in the last 168 hours. BNP (last 3 results) No results for input(s): PROBNP in the last 8760 hours. HbA1C: Recent Labs    06/05/20 0035  HGBA1C 4.9   CBG: No results for input(s): GLUCAP in the last 168 hours. Lipid Profile: No results for input(s): CHOL, HDL, LDLCALC, TRIG, CHOLHDL, LDLDIRECT in the last 72 hours. Thyroid Function Tests: No results for input(s): TSH, T4TOTAL, FREET4, T3FREE, THYROIDAB in the last 72 hours. Anemia Panel: No results for input(s): VITAMINB12, FOLATE, FERRITIN, TIBC, IRON, RETICCTPCT in  the last 72 hours. Urine analysis:    Component Value Date/Time   COLORURINE YELLOW 06/04/2020 0320   APPEARANCEUR CLEAR 06/04/2020 0320   APPEARANCEUR Clear 12/18/2014 1156   LABSPEC 1.005 06/04/2020 0320   PHURINE 7.0 06/04/2020 0320   GLUCOSEU NEGATIVE 06/04/2020 0320   HGBUR NEGATIVE 06/04/2020 0320   BILIRUBINUR NEGATIVE 06/04/2020 0320   BILIRUBINUR Negative 12/18/2014 1156   KETONESUR NEGATIVE 06/04/2020 0320   PROTEINUR NEGATIVE 06/04/2020 0320   UROBILINOGEN 0.2 08/05/2014 0951   NITRITE NEGATIVE 06/04/2020 0320   LEUKOCYTESUR SMALL (A) 06/04/2020 0320   Sepsis Labs: @LABRCNTIP (procalcitonin:4,lacticidven:4)  ) Recent Results (from the past 240 hour(s))  Blood Culture (routine x 2)     Status: None (Preliminary result)   Collection Time: 06/04/20 12:50 AM   Specimen: BLOOD  Result Value Ref Range Status   Specimen Description BLOOD LEFT ANTECUBITAL  Final   Special Requests   Final    BOTTLES DRAWN AEROBIC AND ANAEROBIC Blood Culture adequate volume   Culture   Final    NO GROWTH 3  DAYS Performed at [facility], [place] [address], [place], [place] [address]    Report Status PENDING  Incomplete  Respiratory Panel by RT PCR (Flu A&B, Covid) - Nasopharyngeal Swab     Status: None   Collection Time: 06/04/20 12:52 AM   Specimen: Nasopharyngeal Swab  Result Value Ref Range Status   SARS Coronavirus 2 by RT PCR NEGATIVE NEGATIVE Final    Comment: (NOTE) SARS-CoV-2 target nucleic acids are NOT DETECTED.  The SARS-CoV-2 RNA is generally detectable in upper respiratoy specimens during the acute phase of infection. The lowest concentration of SARS-CoV-2 viral copies this assay can detect is 131 copies/mL. A negative result does not preclude SARS-Cov-2 infection and should not be used as the sole basis for treatment or other patient management decisions. A negative result may occur with  improper specimen collection/handling, submission of specimen other than nasopharyngeal swab, presence of viral mutation(s) within the areas targeted by this assay, and inadequate number of viral copies (<131 copies/mL). A negative result must be combined with clinical observations, patient history, and epidemiological information. The expected result is Negative.  Fact Sheet for Patients:  PinkCheek.be  Fact Sheet for Healthcare Providers:  GravelBags.it  This test is no t yet approved or cleared by the [place] FDA and  has been authorized for detection and/or diagnosis of SARS-CoV-2 by FDA under an Emergency Use Authorization (EUA). This EUA will remain  in effect (meaning this test can be used) for the duration of the COVID-19 declaration under Section 564(b)(1) of the Act, 21 U.S.C. section 360bbb-3(b)(1), unless the authorization is terminated or revoked sooner.     Influenza A by PCR NEGATIVE NEGATIVE Final   Influenza B by PCR NEGATIVE NEGATIVE Final    Comment: (NOTE) The Xpert Xpress SARS-CoV-2/FLU/RSV  assay is intended as an aid in  the diagnosis of influenza from Nasopharyngeal swab specimens and  should not be used as a sole basis for treatment. Nasal washings and  aspirates are unacceptable for Xpert Xpress SARS-CoV-2/FLU/RSV  testing.  Fact Sheet for Patients: PinkCheek.be  Fact Sheet for Healthcare Providers: GravelBags.it  This test is not yet approved or cleared by the [place] FDA and  has been authorized for detection and/or diagnosis of SARS-CoV-2 by  FDA under an Emergency Use Authorization (EUA). This EUA will remain  in effect (meaning this test can be used) for the duration of the  Covid-19 declaration  under Section 564(b)(1) of the Act, 21  U.S.C. section 360bbb-3(b)(1), unless the authorization is  terminated or revoked. Performed at [facility], [place] [address], [place], [place] [address]   Blood Culture (routine x 2)     Status: None (Preliminary result)   Collection Time: 06/04/20 12:54 AM   Specimen: BLOOD RIGHT HAND  Result Value Ref Range Status   Specimen Description BLOOD RIGHT HAND  Final   Special Requests   Final    BOTTLES DRAWN AEROBIC AND ANAEROBIC Blood Culture results may not be optimal due to an inadequate volume of blood received in culture bottles   Culture   Final    NO GROWTH 3 DAYS Performed at [facility], [place] [address], [place], [place] [address]    Report Status PENDING  Incomplete  Urine culture     Status: Abnormal   Collection Time: 06/04/20  3:13 AM   Specimen: In/Out Cath Urine  Result Value Ref Range Status   Specimen Description IN/OUT CATH URINE  Final   Special Requests   Final    NONE Performed at [facility], [place] [address], [place], [place] [address]    Culture (A)  Final    70,000 COLONIES/mL MULTIPLE SPECIES PRESENT, SUGGEST RECOLLECTION   Report Status 06/05/2020 FINAL  Final  MRSA PCR Screening     Status: None   Collection Time:  06/04/20 11:53 PM   Specimen: Nasopharyngeal  Result Value Ref Range Status   MRSA by PCR NEGATIVE NEGATIVE Final    Comment:        The GeneXpert MRSA Assay (FDA approved for NASAL specimens only), is one component of a comprehensive MRSA colonization surveillance program. It is not intended to diagnose MRSA infection nor to guide or monitor treatment for MRSA infections. Performed at [facility], [place] [address], [place], [place] [address]       Studies: No results found.  Scheduled Meds: . buPROPion  300 mg Oral Daily  . diltiazem  120 mg Oral Daily  . famotidine  20 mg Oral QHS  . gabapentin  300 mg Oral Q6H  . lactulose  10 g Oral Daily  . levothyroxine  75 mcg Oral QAC breakfast  . linaclotide  145 mcg Oral QAC breakfast  . loratadine  10 mg Oral Daily  . melatonin  5 mg Oral QHS  . midodrine  5 mg Oral TID WC  . ondansetron  8 mg Oral q morning - 10a  . pantoprazole  40 mg Oral Daily  . psyllium  1 packet Oral QPM  . simvastatin  10 mg Oral QPM  . tamsulosin  0.4 mg Oral q AM  . Warfarin - Pharmacist Dosing Inpatient   Does not apply q1600    Continuous Infusions: . amiodarone 30 mg/hr (06/07/20 0247)  . ceFEPime (MAXIPIME) IV 2 g (06/07/20 1817)     LOS: 3 days     Alma Friendly, MD [facility]  If 7PM-7AM, please contact night-coverage [email or web address] 06/07/2020, 6:18 PM

## 2020-06-08 ENCOUNTER — Ambulatory Visit: Payer: Self-pay | Admitting: Gynecologic Oncology

## 2020-06-08 DIAGNOSIS — A419 Sepsis, unspecified organism: Secondary | ICD-10-CM | POA: Diagnosis not present

## 2020-06-08 DIAGNOSIS — K5909 Other constipation: Secondary | ICD-10-CM | POA: Diagnosis not present

## 2020-06-08 DIAGNOSIS — J9621 Acute and chronic respiratory failure with hypoxia: Secondary | ICD-10-CM | POA: Diagnosis not present

## 2020-06-08 DIAGNOSIS — G9341 Metabolic encephalopathy: Secondary | ICD-10-CM | POA: Diagnosis not present

## 2020-06-08 LAB — CBC WITH DIFFERENTIAL/PLATELET
Abs Immature Granulocytes: 0.02 10*3/uL (ref 0.00–0.07)
Basophils Absolute: 0 10*3/uL (ref 0.0–0.1)
Basophils Relative: 1 %
Eosinophils Absolute: 0.4 10*3/uL (ref 0.0–0.5)
Eosinophils Relative: 6 %
HCT: 35.4 % — ABNORMAL LOW (ref 36.0–46.0)
Hemoglobin: 11.3 g/dL — ABNORMAL LOW (ref 12.0–15.0)
Immature Granulocytes: 0 %
Lymphocytes Relative: 25 %
Lymphs Abs: 1.6 10*3/uL (ref 0.7–4.0)
MCH: 31.1 pg (ref 26.0–34.0)
MCHC: 31.9 g/dL (ref 30.0–36.0)
MCV: 97.5 fL (ref 80.0–100.0)
Monocytes Absolute: 1 10*3/uL (ref 0.1–1.0)
Monocytes Relative: 16 %
Neutro Abs: 3.3 10*3/uL (ref 1.7–7.7)
Neutrophils Relative %: 52 %
Platelets: 186 10*3/uL (ref 150–400)
RBC: 3.63 MIL/uL — ABNORMAL LOW (ref 3.87–5.11)
RDW: 12.9 % (ref 11.5–15.5)
WBC: 6.3 10*3/uL (ref 4.0–10.5)
nRBC: 0 % (ref 0.0–0.2)

## 2020-06-08 LAB — BASIC METABOLIC PANEL
Anion gap: 8 (ref 5–15)
BUN: 14 mg/dL (ref 8–23)
CO2: 34 mmol/L — ABNORMAL HIGH (ref 22–32)
Calcium: 8.3 mg/dL — ABNORMAL LOW (ref 8.9–10.3)
Chloride: 98 mmol/L (ref 98–111)
Creatinine, Ser: 0.54 mg/dL (ref 0.44–1.00)
GFR, Estimated: >60 mL/min (ref >60)
Glucose, Bld: 112 mg/dL — ABNORMAL HIGH (ref 70–99)
Potassium: 4.5 mmol/L (ref 3.5–5.1)
Sodium: 140 mmol/L (ref 135–145)

## 2020-06-08 LAB — PROTIME-INR
INR: 2 — ABNORMAL HIGH (ref 0.8–1.2)
Prothrombin Time: 22.1 seconds — ABNORMAL HIGH (ref 11.4–15.2)

## 2020-06-08 MED ORDER — WARFARIN SODIUM 2 MG PO TABS
4.0000 mg | ORAL_TABLET | Freq: Once | ORAL | Status: AC
  Administered 2020-06-08: 4 mg via ORAL
  Filled 2020-06-08: qty 2

## 2020-06-08 MED ORDER — FUROSEMIDE 20 MG PO TABS
20.0000 mg | ORAL_TABLET | Freq: Every day | ORAL | Status: DC
  Administered 2020-06-09 – 2020-06-10 (×2): 20 mg via ORAL
  Filled 2020-06-08 (×3): qty 1

## 2020-06-08 MED ORDER — DILTIAZEM HCL ER COATED BEADS 180 MG PO CP24
180.0000 mg | ORAL_CAPSULE | Freq: Every day | ORAL | Status: DC
  Administered 2020-06-08 – 2020-06-09 (×2): 180 mg via ORAL
  Filled 2020-06-08 (×2): qty 1

## 2020-06-08 MED ORDER — FUROSEMIDE 10 MG/ML IJ SOLN
20.0000 mg | Freq: Once | INTRAMUSCULAR | Status: AC
  Administered 2020-06-08: 20 mg via INTRAVENOUS
  Filled 2020-06-08: qty 2

## 2020-06-08 MED ORDER — FUROSEMIDE 10 MG/ML IJ SOLN: 20.0000 mg | Freq: Every day | INTRAMUSCULAR | Status: DC

## 2020-06-08 NOTE — Progress Notes (Signed)
ANTICOAGULATION CONSULT NOTE - Consult  Pharmacy Consult for warfarin Indication: atrial fibrillation  Allergies  Allergen Reactions  . Chlorhexidine Hives and Other (See Comments)    And, sores appear where applied  . Hydrocodone Nausea And Vomiting and Other (See Comments)    Hydrocodone causes vomiting  . Oxycodone Nausea And Vomiting and Other (See Comments)    Oral oxycodone causes vomiting  . Zoloft [Sertraline] Hives, Swelling and Rash    Patient Measurements:    Vital Signs: Temp: 98.2 F (36.8 C) (10/18 2305) Temp Source: Oral (10/18 2305) BP: 119/67 (10/18 2305) Pulse Rate: 104 (10/18 2337)  Labs: Recent Labs    06/06/20 0132 06/06/20 0132 06/07/20 0409 06/08/20 0003  HGB 11.1*   < > 10.7* 11.3*  HCT 34.0*  --  33.6* 35.4*  PLT 158  --  174 186  LABPROT 27.4*  --  21.2* 22.1*  INR 2.7*  --  1.9* 2.0*  CREATININE 0.45  --  0.48 0.54   < > = values in this interval not displayed.    Estimated Creatinine Clearance: 64.7 mL/min (by C-G formula based on SCr of 0.54 mg/dL).   Medical History: Past Medical History:  Diagnosis Date  . Abnormality of gait 03/01/2015  . Anxiety   . Arthritis    "knees" (01/19/2016)  . Asthma    as a child   . Back pain   . Cancer ([place])    / RENAL CELL CARCINOMA - GETS CT SCAN EVERY 6 MONTHS TO WATCH   . CHF (congestive heart failure) ([place])   . Chronic pain    "nerve pain from the MS"  . Complex atypical endometrial hyperplasia   . Constipation   . Depression   . Diabetes mellitus without complication ([place])   . DVT (deep venous thrombosis) ([place]) 1983   "RLE; may have just been phlebitis; it was before the age of dopplers"  . Dysrhythmia    afib   . 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 GERD (gastroesophageal reflux disease)   . HLD (hyperlipidemia)   . Hypertension   . Hypotension   . Hypothyroidism   . Joint pain   . Multiple sclerosis ([place])    has had  this may 1989-[place] reg doc  . OSA on CPAP    bipap machine - SLEEP APNEA WORSE SINCE COVID IN 1/21 PER PATIENT SETTING HAS INCREASED FROM 9 TO 30   . Osteoarthritis   . PAF (paroxysmal atrial fibrillation) ([place])    on coumadin; documented on monitor 06/2010  . Pneumonia 11/2011  . Sleep apnea    Assessment: 3 YOF presenting with palpitations, history of Afib on anticoagulation with warfarin. Her last dose prior to admission was on 10/13. She did not receive warfarin on 10/14 or 10/15.   Today, INR 2 -therapeutic. Her warfarin requirements are likely decreased from home needs due to acute illness, antibiotic therapy (metronidazole), and amiodarone was started 10/16.  Good appetite noted on cardiac diet - unsure of % intake.   PTA dosing: 5mg  daily except Monday/Friday 2.5mg   Drug interactions: started Flagyl 10/15, currently on an amiodarone drip as well  Goal of Therapy:  INR 2-3 Monitor platelets by anticoagulation protocol: Yes   Plan:  Warfarin 4mg  po x1 again tonight Monitor daily INR, CBC, clinical course, s/sx of bleed, drug interactions   Sloan Leiter, PharmD, BCPS, BCCCP Clinical Pharmacist Please refer to  AMION for [place] numbers 06/08/2020, 7:04 AM

## 2020-06-08 NOTE — Telephone Encounter (Signed)
Aware. 

## 2020-06-08 NOTE — Progress Notes (Addendum)
PROGRESS NOTE  Toni [name] YJE:[id number] DOB: [birth year] DOA: 06/04/2020 PCP: Toni [name], Toni [name]  HPI/Recap of past 24 hours: HPI from Dr Toni [name] is a 74 y.o. female with medical history significant of MS, a.fib on coumadin, OSA on BIPAP, uses oxygen with BIPAP at night only, frequent recurrent UTIs due to MS. Pt seen in ED earlier this am for palpitations, possible UTI.  Tm at that time was 99.9, was discharged. Went home, pt took nap, woke up and was confused per family.  EMS called.  O2 sat per EMS 70, Temp 100. Patient denies: Cough, SOB, CP, N/V, changes in bowel habits (has chronic constipation from MS). Has mild LLQ abd TTP. In the ED, Tm 101.3.  Initial BP 87/58.  Improves to 637 systolic after 30 ml/kg bolus (this about baseline per pt and daughter). AMS is resolved at this time. WBC 10.4k. was 11.2k this AM, INR 2.7. CXR neg, UA neg, CT abd/pelvis: shows constipation and a small renal cell carcinoma that is known (just being followed at the moment as it hasnt done much for years). COVID and flu neg. Started on empiric cefepime, flagyl, vanc.  Patient admitted for further management.     Today, met pt sleeping, a little hard to arouse (reports it occasionally happens), upon arousal, was alert/awake/oriented.  Reported being a little short of breath, and fatigue. Daughter at bedside, discussed plan of care.    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   Chronic constipation with overflow incontinence   Permanent atrial fibrillation (HCC)   Obstructive sleep apnea treated with bilevel positive airway pressure (BiPAP)   Type 2 diabetes mellitus without complication, without long-term current use of insulin (HCC)   Acute on chronic respiratory failure with hypoxia (HCC)   Acute metabolic encephalopathy   Sepsis likely 2/2 possible ?UTI- Pt completed keflex course  and has been on suppressive macrobid PTA Febrile, tachycardic on initial presentation  Currently afebrile, with no leukocytosis UA unremarkable, UC showed 70,000 multiple species BC x2 NGTD Procalcitonin negative Lactic acid negative Covid/flu negative Chest x-ray showed mild right base atelectasis, no edema or airspace opacity noted S/P IV fluids Continue cefepime, d/c vancomycin, Flagyl  Acute respiratory failure with hypoxia ??Acute on chronic diastolic HF BNP 858.8, better than previous EMS noted sats 70% on room air Currently requiring about 2 L of O2, saturating above 90 Chest x-ray unremarkable for any infection, or edema Echo showed EF of 55 to 60%, no regional wall motion abnormality, left ventricular diastolic function could not be evaluated Covid/flu negative Continue supplemental oxygen  History of hypotension Orthostatic hypotension Reports hypotension since diagnosis of Covid last year S/p IV fluids Resume home midodrine  Acute metabolic encephalopathy Resolved  Permanent A. Fib with RVR Heart rate with better control S/p amiodarone drip, resumed p.o. Cardizem at a lower dose, increase to 240 mg as BP tolerates Continue Coumadin-initially supratherapeutic on admission, currently therapeutic  Reports hx of central sleep apnea on BiPAP Continue BiPAP per home settings  Chronic constipation Continue bowel regimen  Multiple sclerosis Continue home management         Malnutrition Type:      Malnutrition Characteristics:      Nutrition Interventions:       Estimated body mass index is 33.19 kg/m as calculated from the following:   Height as of an earlier encounter on 06/04/20: '5\' 3"'  (1.6 m).  Weight as of an earlier encounter on 06/04/20: 85 kg.     Code Status: Full  Family Communication: Discussed extensively with daughter at bedside on 06/08/2020  Disposition Plan: Status is: Inpatient  Remains inpatient appropriate  because:Inpatient level of care appropriate due to severity of illness   Dispo: The patient is from: Home              Anticipated d/c is to: Home              Anticipated d/c date is: 2 days              Patient currently is not medically stable to d/c.   Consultants: None  Procedures:  None  Antimicrobials:  Cefepime    DVT prophylaxis: Coumadin   Objective: Vitals:   06/07/20 2337 06/08/20 0721 06/08/20 1209 06/08/20 1211  BP:  98/62  116/60  Pulse: (!) 104 66  88  Resp: '16 17  17  ' Temp:  (!) 97.5 F (36.4 C) (!) 97.5 F (36.4 C) (!) 97.5 F (36.4 C)  TempSrc:  Oral Axillary Axillary  SpO2: 98% 94%  100%    Intake/Output Summary (Last 24 hours) at 06/08/2020 1524 Last data filed at 06/08/2020 0951 Gross per 24 hour  Intake --  Output 1250 ml  Net -1250 ml   There were no vitals filed for this visit.  Exam:  General: NAD   Cardiovascular: S1, S2 present  Respiratory:  Diminished breath sounds bilaterally  Abdomen: Soft, nontender, mildly distended, bowel sounds present  Musculoskeletal: 1+ bilateral pedal edema noted  Skin: Normal  Psychiatry: Normal mood    Data Reviewed: CBC: Recent Labs  Lab 06/04/20 0052 06/04/20 0052 06/04/20 1911 06/05/20 0035 06/06/20 0132 06/07/20 0409 06/08/20 0003  WBC 11.2*   < > 10.4 8.7 8.1 6.9 6.3  NEUTROABS 7.5  --  7.6  --  5.2 4.1 3.3  HGB 12.4   < > 11.8* 11.5* 11.1* 10.7* 11.3*  HCT 38.3   < > 36.4 35.2* 34.0* 33.6* 35.4*  MCV 95.0   < > 96.6 98.1 96.3 98.5 97.5  PLT 165   < > 142* 130* 158 174 186   < > = values in this interval not displayed.   Basic Metabolic Panel: Recent Labs  Lab 06/04/20 1911 06/05/20 0035 06/06/20 0132 06/07/20 0409 06/08/20 0003  NA 139 139 140 143 140  K 4.0 3.7 4.3 4.0 4.5  CL 100 101 103 105 98  CO2 30 29 33* 29 34*  GLUCOSE 79 138* 89 91 112*  BUN '13 12 13 14 14  ' CREATININE 0.39* 0.48 0.45 0.48 0.54  CALCIUM 8.4* 8.3* 8.1* 8.1* 8.3*    GFR: Estimated Creatinine Clearance: 64.7 mL/min (by C-G formula based on SCr of 0.54 mg/dL). Liver Function Tests: Recent Labs  Lab 06/04/20 0052 06/04/20 1911 06/05/20 0035  AST 15 13* 15  ALT '18 15 15  ' ALKPHOS 45 42 37*  BILITOT 0.3 0.9 0.9  PROT 5.7* 5.1* 4.8*  ALBUMIN 3.1* 2.7* 2.6*   No results for input(s): LIPASE, AMYLASE in the last 168 hours. Recent Labs  Lab 06/04/20 1911  AMMONIA 20   Coagulation Profile: Recent Labs  Lab 06/04/20 1911 06/05/20 0035 06/06/20 0132 06/07/20 0409 06/08/20 0003  INR 2.7* 2.6* 2.7* 1.9* 2.0*   Cardiac Enzymes: No results for input(s): CKTOTAL, CKMB, CKMBINDEX, TROPONINI in the last 168 hours. BNP (last 3 results) No results for input(s): PROBNP in the last 8760  hours. HbA1C: No results for input(s): HGBA1C in the last 72 hours. CBG: No results for input(s): GLUCAP in the last 168 hours. Lipid Profile: No results for input(s): CHOL, HDL, LDLCALC, TRIG, CHOLHDL, LDLDIRECT in the last 72 hours. Thyroid Function Tests: No results for input(s): TSH, T4TOTAL, FREET4, T3FREE, THYROIDAB in the last 72 hours. Anemia Panel: No results for input(s): VITAMINB12, FOLATE, FERRITIN, TIBC, IRON, RETICCTPCT in the last 72 hours. Urine analysis:    Component Value Date/Time   COLORURINE YELLOW 06/04/2020 0320   APPEARANCEUR CLEAR 06/04/2020 0320   APPEARANCEUR Clear 12/18/2014 1156   LABSPEC 1.005 06/04/2020 0320   PHURINE 7.0 06/04/2020 0320   GLUCOSEU NEGATIVE 06/04/2020 0320   HGBUR NEGATIVE 06/04/2020 0320   BILIRUBINUR NEGATIVE 06/04/2020 0320   BILIRUBINUR Negative 12/18/2014 1156   KETONESUR NEGATIVE 06/04/2020 0320   PROTEINUR NEGATIVE 06/04/2020 0320   UROBILINOGEN 0.2 08/05/2014 0951   NITRITE NEGATIVE 06/04/2020 0320   LEUKOCYTESUR SMALL (A) 06/04/2020 0320   Sepsis Labs: '@LABRCNTIP' (procalcitonin:4,lacticidven:4)  ) Recent Results (from the past 240 hour(s))  Blood Culture (routine x 2)     Status: None  (Preliminary result)   Collection Time: 06/04/20 12:50 AM   Specimen: BLOOD  Result Value Ref Range Status   Specimen Description BLOOD LEFT ANTECUBITAL  Final   Special Requests   Final    BOTTLES DRAWN AEROBIC AND ANAEROBIC Blood Culture adequate volume   Culture   Final    NO GROWTH 4 DAYS Performed at [facility], [place] [address] [place], [place] [address]    Report Status PENDING  Incomplete  Respiratory Panel by RT PCR (Flu A&B, Covid) - Nasopharyngeal Swab     Status: None   Collection Time: 06/04/20 12:52 AM   Specimen: Nasopharyngeal Swab  Result Value Ref Range Status   SARS Coronavirus 2 by RT PCR NEGATIVE NEGATIVE Final    Comment: (NOTE) SARS-CoV-2 target nucleic acids are NOT DETECTED.  The SARS-CoV-2 RNA is generally detectable in upper respiratoy specimens during the acute phase of infection. The lowest concentration of SARS-CoV-2 viral copies this assay can detect is 131 copies/mL. A negative result does not preclude SARS-Cov-2 infection and should not be used as the sole basis for treatment or other patient management decisions. A negative result may occur with  improper specimen collection/handling, submission of specimen other than nasopharyngeal swab, presence of viral mutation(s) within the areas targeted by this assay, and inadequate number of viral copies (<131 copies/mL). A negative result must be combined with clinical observations, patient history, and epidemiological information. The expected result is Negative.  Fact Sheet for Patients:  PinkCheek.be  Fact Sheet for Healthcare Providers:  GravelBags.it  This test is no t yet approved or cleared by the [place] FDA and  has been authorized for detection and/or diagnosis of SARS-CoV-2 by FDA under an Emergency Use Authorization (EUA). This EUA will remain  in effect (meaning this test can be used) for the duration of the COVID-19  declaration under Section 564(b)(1) of the Act, 21 U.S.C. section 360bbb-3(b)(1), unless the authorization is terminated or revoked sooner.     Influenza A by PCR NEGATIVE NEGATIVE Final   Influenza B by PCR NEGATIVE NEGATIVE Final    Comment: (NOTE) The Xpert Xpress SARS-CoV-2/FLU/RSV assay is intended as an aid in  the diagnosis of influenza from Nasopharyngeal swab specimens and  should not be used as a sole basis for treatment. Nasal washings and  aspirates are unacceptable for Xpert Xpress SARS-CoV-2/FLU/RSV  testing.  Fact Sheet for Patients: PinkCheek.be  Fact Sheet for Healthcare Providers: GravelBags.it  This test is not yet approved or cleared by the [place] FDA and  has been authorized for detection and/or diagnosis of SARS-CoV-2 by  FDA under an Emergency Use Authorization (EUA). This EUA will remain  in effect (meaning this test can be used) for the duration of the  Covid-19 declaration under Section 564(b)(1) of the Act, 21  U.S.C. section 360bbb-3(b)(1), unless the authorization is  terminated or revoked. Performed at [facility], [place] [address] [place], [place] [address]   Blood Culture (routine x 2)     Status: None (Preliminary result)   Collection Time: 06/04/20 12:54 AM   Specimen: BLOOD RIGHT HAND  Result Value Ref Range Status   Specimen Description BLOOD RIGHT HAND  Final   Special Requests   Final    BOTTLES DRAWN AEROBIC AND ANAEROBIC Blood Culture results may not be optimal due to an inadequate volume of blood received in culture bottles   Culture   Final    NO GROWTH 4 DAYS Performed at [facility], [place] [address] [place], [place] [address]    Report Status PENDING  Incomplete  Urine culture     Status: Abnormal   Collection Time: 06/04/20  3:13 AM   Specimen: In/Out Cath Urine  Result Value Ref Range Status   Specimen Description IN/OUT CATH URINE  Final   Special  Requests   Final    NONE Performed at [facility], [place] [address], [place], [place] [address]    Culture (A)  Final    70,000 COLONIES/mL MULTIPLE SPECIES PRESENT, SUGGEST RECOLLECTION   Report Status 06/05/2020 FINAL  Final  MRSA PCR Screening     Status: None   Collection Time: 06/04/20 11:53 PM   Specimen: Nasopharyngeal  Result Value Ref Range Status   MRSA by PCR NEGATIVE NEGATIVE Final    Comment:        The GeneXpert MRSA Assay (FDA approved for NASAL specimens only), is one component of a comprehensive MRSA colonization surveillance program. It is not intended to diagnose MRSA infection nor to guide or monitor treatment for MRSA infections. Performed at [facility], [place] [address] [place],  [address]       Studies: No results found.  Scheduled Meds: . buPROPion  300 mg Oral Daily  . diltiazem  180 mg Oral Daily  . famotidine  20 mg Oral QHS  . furosemide  20 mg Intravenous Once  . [START ON 06/09/2020] furosemide  20 mg Oral Daily  . gabapentin  300 mg Oral Q6H  . lactulose  10 g Oral Daily  . levothyroxine  75 mcg Oral QAC breakfast  . linaclotide  145 mcg Oral QAC breakfast  . loratadine  10 mg Oral Daily  . melatonin  5 mg Oral QHS  . midodrine  5 mg Oral TID WC  . ondansetron  8 mg Oral q morning - 10a  . pantoprazole  40 mg Oral Daily  . psyllium  1 packet Oral QPM  . simvastatin  10 mg Oral QPM  . tamsulosin  0.4 mg Oral q AM  . warfarin  4 mg Oral ONCE-1600  . Warfarin - Pharmacist Dosing Inpatient   Does not apply q1600    Continuous Infusions: . amiodarone 30 mg/hr (06/07/20 0247)  . ceFEPime (MAXIPIME) IV 2 g (06/08/20 1017)     LOS: 4  days     Toni Friendly, Toni [name] [facility]  If 7PM-7AM, please contact night-coverage [email or web address] 06/08/2020, 3:24 PM

## 2020-06-08 NOTE — Progress Notes (Signed)
RT placed patient on home trilogy with 5L O2 bleed in.

## 2020-06-08 NOTE — Progress Notes (Signed)
Occupational Therapy Treatment Patient Details Name: Toni [name] MRN: [id number] DOB: [birth year] Today's Date: 06/08/2020    History of present illness Pt is a 74 y.o. female with PMH of persistent A. fib, hypertension, HLD, DVT, MS, OSA, COVID (07/2019) presents to ED 06/04/20 for acute onset, persistent and progressively worsening abdominal pain and distention. Workup for Afib with RVR, UTI, sepsis, acute respiratory failure.   OT comments  Patient supine in bed and agreeable for OT session.  Thankful for adaptive feeding items provided last session, able to self feed with setup today.  Completing bed mobility with min assist and slide board transfers with mod assist +2 to recliner today.  Pt reports increased weakness today but VSS during session.  Will follow acutely.     Follow Up Recommendations  Home health OT;Supervision/Assistance - 24 hour    Equipment Recommendations  None recommended by OT    Recommendations for Other Services      Precautions / Restrictions Precautions Precautions: Fall;Other (comment) Precaution Comments: Watch BP and SPO2 Restrictions Weight Bearing Restrictions: No       Mobility Bed Mobility Overal bed mobility: Needs Assistance Bed Mobility: Supine to Sit     Supine to sit: Min assist;HOB elevated     General bed mobility comments: min assist for trunk support and R LE movement to EOB   Transfers Overall transfer level: Needs assistance Equipment used: Sliding board Transfers: Lateral/Scoot Transfers          Lateral/Scoot Transfers: Mod assist;+2 physical assistance;+2 safety/equipment General transfer comment: mod assist to use sliding board towards L side into recliner (increased effort and assist today due to weakness, wet pad under patient and difficulty sliding into chair once on pillow to soften chair)     Balance Overall balance assessment: Needs assistance Sitting-balance support: No upper extremity supported;Feet  supported Sitting balance-Leahy Scale: Fair Sitting balance - Comments: min guard for safety, several LOB towards L posterior with min guard to correct                                   ADL either performed or assessed with clinical judgement   ADL Overall ADL's : Needs assistance/impaired                         Toilet Transfer: Moderate assistance;+2 for physical assistance;+2 for safety/equipment;Transfer board Toilet Transfer Details (indicate cue type and reason): simulated to drop arm recliner, increased assist due to wetness of pad under patient and difficulty sliding  Toileting- Clothing Manipulation and Hygiene: Total assistance;Bed level       Functional mobility during ADLs: Moderate assistance;+2 for physical assistance;+2 for safety/equipment;Cueing for safety General ADL Comments: pt highly motivated     Vision       Perception     Praxis      Cognition Arousal/Alertness: Awake/alert Behavior During Therapy: WFL for tasks assessed/performed Overall Cognitive Status: Within Functional Limits for tasks assessed                                          Exercises     Shoulder Instructions       General Comments HR up to 122 with activity, SpO2 100% on 1L     Pertinent Vitals/ Pain  Pain Assessment: Faces Faces Pain Scale: Hurts a little bit Pain Location: R knee  Pain Descriptors / Indicators: Discomfort;Guarding Pain Intervention(s): Monitored during session;Repositioned;Limited activity within patient's tolerance  Home Living                                          Prior Functioning/Environment              Frequency  Min 2X/week        Progress Toward Goals  OT Goals(current goals can now be found in the care plan section)  Progress towards OT goals: Progressing toward goals  Acute Rehab OT Goals Patient Stated Goal: Go home to start therapy again OT Goal  Formulation: With patient  Plan Discharge plan remains appropriate;Frequency remains appropriate    Co-evaluation                 AM-PAC OT "6 Clicks" Daily Activity     Outcome Measure   Help from another person eating meals?: A Little Help from another person taking care of personal grooming?: A Little Help from another person toileting, which includes using toliet, bedpan, or urinal?: A Lot Help from another person bathing (including washing, rinsing, drying)?: A Lot Help from another person to put on and taking off regular upper body clothing?: A Little Help from another person to put on and taking off regular lower body clothing?: A Lot 6 Click Score: 15    End of Session Equipment Utilized During Treatment: Oxygen;Other (comment) (slideboard)  OT Visit Diagnosis: Unsteadiness on feet (R26.81);Other abnormalities of gait and mobility (R26.89);Muscle weakness (generalized) (M62.81)   Activity Tolerance Patient tolerated treatment well   Patient Left in chair;with call bell/phone within reach;with family/visitor present   Nurse Communication Mobility status;Other (comment) (transfer back to bed)        Time: 0177-9390 OT Time Calculation (min): 39 min  Charges: OT General Charges $OT Visit: 1 Visit OT Treatments $Self Care/Home Management : 38-52 mins  [place] Pager [phone] Office [phone]    Delight Stare 06/08/2020, 10:21 AM

## 2020-06-09 ENCOUNTER — Inpatient Hospital Stay (HOSPITAL_COMMUNITY): Payer: Medicare Other

## 2020-06-09 DIAGNOSIS — R6520 Severe sepsis without septic shock: Secondary | ICD-10-CM | POA: Diagnosis not present

## 2020-06-09 DIAGNOSIS — J9601 Acute respiratory failure with hypoxia: Secondary | ICD-10-CM | POA: Diagnosis not present

## 2020-06-09 DIAGNOSIS — A419 Sepsis, unspecified organism: Secondary | ICD-10-CM | POA: Diagnosis not present

## 2020-06-09 DIAGNOSIS — I4891 Unspecified atrial fibrillation: Secondary | ICD-10-CM

## 2020-06-09 LAB — CBC WITH DIFFERENTIAL/PLATELET
Abs Immature Granulocytes: 0.02 10*3/uL (ref 0.00–0.07)
Basophils Absolute: 0.1 10*3/uL (ref 0.0–0.1)
Basophils Relative: 1 %
Eosinophils Absolute: 0.4 10*3/uL (ref 0.0–0.5)
Eosinophils Relative: 7 %
HCT: 34.2 % — ABNORMAL LOW (ref 36.0–46.0)
Hemoglobin: 11 g/dL — ABNORMAL LOW (ref 12.0–15.0)
Immature Granulocytes: 0 %
Lymphocytes Relative: 26 %
Lymphs Abs: 1.5 10*3/uL (ref 0.7–4.0)
MCH: 31.9 pg (ref 26.0–34.0)
MCHC: 32.2 g/dL (ref 30.0–36.0)
MCV: 99.1 fL (ref 80.0–100.0)
Monocytes Absolute: 1 10*3/uL (ref 0.1–1.0)
Monocytes Relative: 17 %
Neutro Abs: 2.8 10*3/uL (ref 1.7–7.7)
Neutrophils Relative %: 49 %
Platelets: 214 10*3/uL (ref 150–400)
RBC: 3.45 MIL/uL — ABNORMAL LOW (ref 3.87–5.11)
RDW: 12.8 % (ref 11.5–15.5)
WBC: 5.8 10*3/uL (ref 4.0–10.5)
nRBC: 0 % (ref 0.0–0.2)

## 2020-06-09 LAB — BASIC METABOLIC PANEL
Anion gap: 7 (ref 5–15)
BUN: 16 mg/dL (ref 8–23)
CO2: 36 mmol/L — ABNORMAL HIGH (ref 22–32)
Calcium: 8.3 mg/dL — ABNORMAL LOW (ref 8.9–10.3)
Chloride: 97 mmol/L — ABNORMAL LOW (ref 98–111)
Creatinine, Ser: 0.59 mg/dL (ref 0.44–1.00)
GFR, Estimated: >60 mL/min (ref >60)
Glucose, Bld: 90 mg/dL (ref 70–99)
Potassium: 4.3 mmol/L (ref 3.5–5.1)
Sodium: 140 mmol/L (ref 135–145)

## 2020-06-09 LAB — CULTURE, BLOOD (ROUTINE X 2)
Culture: NO GROWTH
Culture: NO GROWTH
Special Requests: ADEQUATE

## 2020-06-09 LAB — PROTIME-INR
INR: 2.5 — ABNORMAL HIGH (ref 0.8–1.2)
Prothrombin Time: 26.2 seconds — ABNORMAL HIGH (ref 11.4–15.2)

## 2020-06-09 MED ORDER — DILTIAZEM HCL 60 MG PO TABS
60.0000 mg | ORAL_TABLET | Freq: Three times a day (TID) | ORAL | Status: DC
  Administered 2020-06-10 (×3): 60 mg via ORAL
  Filled 2020-06-09 (×3): qty 1

## 2020-06-09 MED ORDER — CEPHALEXIN 500 MG PO CAPS
500.0000 mg | ORAL_CAPSULE | Freq: Three times a day (TID) | ORAL | Status: DC
  Administered 2020-06-09 – 2020-06-11 (×5): 500 mg via ORAL
  Filled 2020-06-09 (×7): qty 1

## 2020-06-09 MED ORDER — FUROSEMIDE 10 MG/ML IJ SOLN
20.0000 mg | Freq: Once | INTRAMUSCULAR | Status: AC
  Administered 2020-06-09: 20 mg via INTRAVENOUS
  Filled 2020-06-09: qty 2

## 2020-06-09 MED ORDER — WARFARIN SODIUM 2 MG PO TABS
4.0000 mg | ORAL_TABLET | Freq: Once | ORAL | Status: AC
  Administered 2020-06-09: 4 mg via ORAL
  Filled 2020-06-09: qty 2

## 2020-06-09 NOTE — Progress Notes (Signed)
PROGRESS NOTE  Toni [name]  DOB: 09/04/1945  PCP: Prince Solian, MD LNL:[id number]  DOA: 06/04/2020  LOS: 5 days   Chief Complaint  Patient presents with  . Fever  . Altered Mental Status    Brief narrative: Toni [name] is a 74 y.o. female with PMH of multiple sclerosis, bedbound status, A. fib on Coumadin, sleep apnea on nocturnal BiPAP, frequent UTIs who had Covid in January 2021 and has been physically weak since then. Patient was brought from home to the ED on 06/04/2020 with complaint of palpitation, fever and progressive symptoms.  EMS noted a temperature of 100, her oxygen saturation in 70s. In the ED, she had a fever of 101.3, initial BP of 87/58 which improved after bolus of IV fluid. WBC count was 10.4, INR 2.7 Chest x-ray negative. CT abdomen pelvis showed constipation and a known small renal cell carcinoma. Covid PCR negative.  Influenza swab negative. She was started on sepsis pathway with empiric antibiotics, IV fluids and admitted for further evaluation and management.  Subjective: Patient was seen and examined this morning.  Pleasant elderly Caucasian female. Lying on bed.  Daughter at bedside. Patient has remained tachycardic overnight. Daughter also complains of productive cough.  Assessment/Plan: Sepsis - POA Recurrent urinary tract infection -Patient presented with fever, tachycardia, hypotension, history of recurrent UTI on suppressive Macrobid. -Chest x-ray normal.  No other source of infection identified. -Sepsis parameters improved with IV fluid, IV antibiotics. -Urine culture showed 70,000 CFU per mL of multiple species.  However because of her history of recurrent UTIs and improvement with antibiotics, I will treat her as a true case of sepsis secondary to UTI. -Blood culture negative. -Currently on IV cefepime.  We will switch her to oral Keflex. -Patient follows up with urologist and is on suppressive Macrobid daily.  Currently on  hold.  Acute respiratory failure with hypoxia Central sleep apnea -No evidence of infection on chest x-ray.   -Echocardiogram February 2021 with normal EF, normal LV function and no wall motion abnormality.  Obtain repeat echocardiogram. -Per patient's daughter, she has central sleep apnea which probably was responsible for her low saturation of 70% on EMS evaluation. -Initially required supplemental oxygen.  She was given IV Lasix yesterday.  Currently on oral Lasix.  Currently off oxygen in the day.   -Continue BiPAP at night  History of hypotension Orthostatic hypotension -Reports hypotension since diagnosis of Covid last year -Received adequate IV hydration. -Continue home midodrine  Acute metabolic encephalopathy -Resolved.  Mental status back to baseline now.  Permanent A. Fib with RVR -In the setting of sepsis, mild volume overload and respiratory failure, patient heart rate is running elevated.   -Prior to admission, patient was on Cardizem 240 mg daily.   -Heart rate remains elevated this morning. -Cardiology consult obtained.  Medication dose is being adjusted. -Continue Coumadin to target INR 2-3.  Chronic constipation Continue bowel regimen  Multiple sclerosis Continue home management  Right calf tenderness -Bilateral lower extremities ultrasound duplex scan ordered to rule out DVT.  Mobility: Bedbound status.  PT evaluation for upper extremity strength Code Status:   Code Status: Full Code  Nutritional status: There is no height or weight on file to calculate BMI.     Diet Order            Diet Heart Room service appropriate? Yes; Fluid consistency: Thin  Diet effective now  DVT prophylaxis:    Antimicrobials:  IV cefepime Fluid: None Consultants: Cardiology Family Communication:  Daughter at bedside  Status is: Inpatient  Remains inpatient appropriate because: Remains in RVR   Dispo: The patient is from: Home               Anticipated d/c is to: Home              Anticipated d/c date is: 2 days              Patient currently is not medically stable to d/c.       Infusions:  . amiodarone 30 mg/hr (06/07/20 0247)  . ceFEPime (MAXIPIME) IV 2 g (06/09/20 1714)    Scheduled Meds: . buPROPion  300 mg Oral Daily  . [START ON 06/10/2020] diltiazem  60 mg Oral TID  . famotidine  20 mg Oral QHS  . furosemide  20 mg Oral Daily  . gabapentin  300 mg Oral Q6H  . lactulose  10 g Oral Daily  . levothyroxine  75 mcg Oral QAC breakfast  . linaclotide  145 mcg Oral QAC breakfast  . loratadine  10 mg Oral Daily  . melatonin  5 mg Oral QHS  . midodrine  5 mg Oral TID WC  . ondansetron  8 mg Oral q morning - 10a  . pantoprazole  40 mg Oral Daily  . psyllium  1 packet Oral QPM  . simvastatin  10 mg Oral QPM  . tamsulosin  0.4 mg Oral q AM  . Warfarin - Pharmacist Dosing Inpatient   Does not apply q1600    Antimicrobials: Anti-infectives (From admission, onward)   Start     Dose/Rate Route Frequency Ordered Stop   06/05/20 0600  vancomycin (VANCOCIN) IVPB 1000 mg/200 mL premix  Status:  Discontinued        1,000 mg 200 mL/hr over 60 Minutes Intravenous Every 12 hours 06/04/20 1820 06/06/20 1148   06/05/20 0200  ceFEPIme (MAXIPIME) 2 g in sodium chloride 0.9 % 100 mL IVPB        2 g 200 mL/hr over 30 Minutes Intravenous Every 8 hours 06/04/20 1820     06/05/20 0200  metroNIDAZOLE (FLAGYL) IVPB 500 mg  Status:  Discontinued        500 mg 100 mL/hr over 60 Minutes Intravenous Every 8 hours 06/04/20 2057 06/06/20 1148   06/04/20 1815  vancomycin (VANCOREADY) IVPB 1750 mg/350 mL        1,750 mg 175 mL/hr over 120 Minutes Intravenous  Once 06/04/20 1812 06/05/20 0119   06/04/20 1800  ceFEPIme (MAXIPIME) 2 g in sodium chloride 0.9 % 100 mL IVPB        2 g 200 mL/hr over 30 Minutes Intravenous  Once 06/04/20 1756 06/04/20 1945   06/04/20 1800  metroNIDAZOLE (FLAGYL) IVPB 500 mg        500 mg 100 mL/hr over 60  Minutes Intravenous  Once 06/04/20 1756 06/04/20 2117   06/04/20 1800  vancomycin (VANCOCIN) IVPB 1000 mg/200 mL premix  Status:  Discontinued        1,000 mg 200 mL/hr over 60 Minutes Intravenous  Once 06/04/20 1756 06/04/20 1812      PRN meds: acetaminophen **OR** acetaminophen, ALPRAZolam, baclofen, bisacodyl, docusate sodium, ondansetron **OR** ondansetron (ZOFRAN) IV, oxybutynin   Objective: Vitals:   06/09/20 1200 06/09/20 1600  BP: (!) 110/55 117/62  Pulse: 98 83  Resp:  18  Temp: 98.3 F (36.8 C) 98.8 F (  37.1 C)  SpO2: 92% 95%    Intake/Output Summary (Last 24 hours) at 06/09/2020 1725 Last data filed at 06/09/2020 1255 Gross per 24 hour  Intake --  Output 4050 ml  Net -4050 ml   There were no vitals filed for this visit. Weight change:  There is no height or weight on file to calculate BMI.   Physical Exam: General exam: Appears calm and comfortable.  Not in physical distress Skin: No rashes, lesions or ulcers. HEENT: Atraumatic, normocephalic, supple neck, no obvious bleeding Lungs: Clear to auscultation bilaterally CVS: Irregular tachycardia GI/Abd soft, nontender, nondistended, bowel sound present CNS: Alert, awake, oriented x3 Psychiatry: Mood appropriate Extremities: Improving pedal edema.  Mild right calf tenderness.  Data Review: I have personally reviewed the laboratory data and studies available.  Recent Labs  Lab 06/04/20 1911 06/04/20 1911 06/05/20 0035 06/06/20 0132 06/07/20 0409 06/08/20 0003 06/09/20 0231  WBC 10.4   < > 8.7 8.1 6.9 6.3 5.8  NEUTROABS 7.6  --   --  5.2 4.1 3.3 2.8  HGB 11.8*   < > 11.5* 11.1* 10.7* 11.3* 11.0*  HCT 36.4   < > 35.2* 34.0* 33.6* 35.4* 34.2*  MCV 96.6   < > 98.1 96.3 98.5 97.5 99.1  PLT 142*   < > 130* 158 174 186 214   < > = values in this interval not displayed.   Recent Labs  Lab 06/05/20 0035 06/06/20 0132 06/07/20 0409 06/08/20 0003 06/09/20 0231  NA 139 140 143 140 140  K 3.7 4.3 4.0  4.5 4.3  CL 101 103 105 98 97*  CO2 29 33* 29 34* 36*  GLUCOSE 138* 89 91 112* 90  BUN 12 13 14 14 16   CREATININE 0.48 0.45 0.48 0.54 0.59  CALCIUM 8.3* 8.1* 8.1* 8.3* 8.3*    F/u labs ordered  Signed, Terrilee Croak, MD [facility] 06/09/2020

## 2020-06-09 NOTE — Progress Notes (Signed)
Physical Therapy Treatment Patient Details Name: Toni [name] MRN: [id number] DOB: 01-Jul-1946 Today's Date: 06/09/2020    History of Present Illness Pt is a 74 y.o. female with PMH of persistent A. fib, hypertension, HLD, DVT, MS, OSA, COVID (07/2019) presents to ED 06/04/20 for acute onset, persistent and progressively worsening abdominal pain and distention. Workup for Afib with RVR, UTI, sepsis, acute respiratory failure.    PT Comments    Pt just waking from a nap upon PT arrival to room, reports fatigue but motivated to mobilize OOB. Pt required mod +2 assist for bed mobility and transfer to recliner via transfer board, pt's daughter assisting with lines/leads and equipment during transfer. Pt reporting significant fatigue with mobility, stating she feels significantly weaker vs baseline. PT spoke with pt's RN, recommended transfer back to bed via transfer board towards pt's L or via maximove pending pt fatigue. PT to continue to recommend d/c home with family and [place] assist.    Follow Up Recommendations  Home health PT;Supervision/Assistance - 24 hour     Equipment Recommendations  None recommended by PT    Recommendations for Other Services       Precautions / Restrictions Precautions Precautions: Fall;Other (comment) Precaution Comments: Watch BP and SPO2 Restrictions Weight Bearing Restrictions: No    Mobility  Bed Mobility Overal bed mobility: Needs Assistance Bed Mobility: Rolling;Sidelying to Sit Rolling: Mod assist;+2 for safety/equipment Sidelying to sit: Mod assist;HOB elevated       General bed mobility comments: Mod assist +2 for bilateral rolling and brief placement, assist for truncal and LE translation. Mod assist for sidelying>sit for trunk elevation, LE lowering over side of bed, and scooting forward with assist of bed pads.  Transfers   Equipment used: Sliding board Transfers: Lateral/Scoot Transfers          Lateral/Scoot Transfers: Mod  assist;+2 safety/equipment General transfer comment: Mod assist +2 for placement of slide board, sequential scoots, LE blocking anteriorly, and removal of board once in recliner. Pt wishing to transfer "her way", pt using LUE on bedrail when scooting to L and pushing with RUE.  Ambulation/Gait             General Gait Details: unable   Stairs             Wheelchair Mobility    Modified Rankin (Stroke Patients Only)       Balance Overall balance assessment: Needs assistance Sitting-balance support: No upper extremity supported;Feet supported Sitting balance-Leahy Scale: Fair Sitting balance - Comments: min guard for safety, several LOB towards L posterior with min guard to correct                                    Cognition Arousal/Alertness: Awake/alert Behavior During Therapy: WFL for tasks assessed/performed Overall Cognitive Status: Within Functional Limits for tasks assessed                                        Exercises      General Comments General comments (skin integrity, edema, etc.): SpO2 min 89% on RA, HRmax observed 120 bpm      Pertinent Vitals/Pain Pain Assessment: Faces Faces Pain Scale: Hurts little more Pain Location: R knee  Pain Descriptors / Indicators: Discomfort;Guarding Pain Intervention(s): Limited activity within patient's tolerance;Monitored during session;Repositioned    Home  Living                      Prior Function            PT Goals (current goals can now be found in the care plan section) Acute Rehab PT Goals Patient Stated Goal: Go home to start therapy again PT Goal Formulation: With patient Time For Goal Achievement: 06/20/20 Potential to Achieve Goals: Good Progress towards PT goals: Progressing toward goals    Frequency    Min 3X/week      PT Plan Current plan remains appropriate    Co-evaluation              AM-PAC PT "6 Clicks" Mobility   Outcome  Measure  Help needed turning from your back to your side while in a flat bed without using bedrails?: A Lot Help needed moving from lying on your back to sitting on the side of a flat bed without using bedrails?: A Lot Help needed moving to and from a bed to a chair (including a wheelchair)?: A Lot Help needed standing up from a chair using your arms (e.g., wheelchair or bedside chair)?: Total Help needed to walk in hospital room?: Total Help needed climbing 3-5 steps with a railing? : Total 6 Click Score: 9    End of Session   Activity Tolerance: Patient tolerated treatment well;Patient limited by fatigue Patient left: with call bell/phone within reach;with family/visitor present;in chair Nurse Communication: Mobility status;Need for lift equipment (may need maximove for back to bed, RN aware) PT Visit Diagnosis: Other abnormalities of gait and mobility (R26.89);Muscle weakness (generalized) (M62.81)     Time: 8333-8329 PT Time Calculation (min) (ACUTE ONLY): 27 min  Charges:  $Therapeutic Activity: 23-37 mins                    Kadedra Vanaken E, PT Acute Rehabilitation Services Pager [phone]  Office [phone]    Wynell Halberg D Elonda Husky 06/09/2020, 2:44 PM

## 2020-06-09 NOTE — Plan of Care (Signed)
Pt is compliant and well informed about her nutritional needs

## 2020-06-09 NOTE — Care Management Important Message (Signed)
Important Message  Patient Details  Name: Toni [name] MRN: [id number] Date of Birth: Mar 13, 1946   Medicare Important Message Given:  Yes     Orbie Pyo 06/09/2020, 3:32 PM

## 2020-06-09 NOTE — Consult Note (Addendum)
Cardiology Consultation:   Patient ID: Toni [name] MRN: [id number]; DOB: Apr 20, 1946  Admit date: 06/04/2020 Date of Consult: 06/09/2020  Primary Care Provider: Prince Solian, MD [facility] Cardiologist: Pixie Casino, MD  [facility] Electrophysiologist:  None    Patient Profile:   Toni [name] is a 73 y.o. [name] with a hx of MS, paroxysmal afib on coumadin, morbid obesity, varicose veins with venous insufficiency, OSA on Bipap, dyslipidemia who is being seen today for the evaluation of Afib RVR at the request of Dr. Pietro Cassis.  History of Present Illness:   Toni [name] is followed by Dr. Debara Pickett for the above cardiac issues. Patient had COVID in January 2021 and was hospitalized. She had orthostasis and midodrine and salt were utilized. Bystolic and diltiazem were discontinued and replaced with metoprolol 12.5mg  BID. She was sent off to the rehab facility with no residual sob. After 2 days of being home she developed sob and fever. She was on lasix 20 mg daily. She was admitted with PNA and diuresed. She was in afib RVR.   Metoprolol was switched to diltiazem 180mg  daily. Echo showed LVEF 55-60%. She was discharged and after that she struggled with orthostatic hypotension requiring midodrine. At follow-up Diltiazem was increased for better rate control of afib. She started taking lasix as needed for abdominal bloating and swelling, between 20 and 40 mg daily.   The patient was admitted 06/04/20 for sepsis from UTI, confusion, and palpitations. Patient was febrile in the ED. Also hypotensive and was administered 1 bolus. WBC 10.4K. IVR 2.7. CX was negative and UA negative. CT ab/pelvis showed constipation and small renal cell carcinoma that was known. COVID and flu negative. EKG showed Afib RVR with rates in the 160s. She was started on IV amio and PO dilt. Patient was started on IX abx and admitted. Cardiology was admitted for afib RVR.   Past Medical History:  Diagnosis Date    . Abnormality of gait 03/01/2015  . Anxiety   . Arthritis    "knees" (01/19/2016)  . Asthma    as a child   . Back pain   . Cancer ([place])    / RENAL CELL CARCINOMA - GETS CT SCAN EVERY 6 MONTHS TO WATCH   . CHF (congestive heart failure) ([place])   . Chronic pain    "nerve pain from the MS"  . Complex atypical endometrial hyperplasia   . Constipation   . Depression   . Diabetes mellitus without complication ([place])   . DVT (deep venous thrombosis) ([place]) 1983   "RLE; may have just been phlebitis; it was before the age of dopplers"  . Dysrhythmia    afib   . 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 GERD (gastroesophageal reflux disease)   . HLD (hyperlipidemia)   . Hypertension   . Hypotension   . Hypothyroidism   . Joint pain   . Multiple sclerosis ([place])    has had this may 1989-[facility] reg doc  . OSA on CPAP    bipap machine - SLEEP APNEA WORSE SINCE COVID IN 1/21 PER PATIENT SETTING HAS INCREASED FROM 9 TO 30   . Osteoarthritis   . PAF (paroxysmal atrial fibrillation) ([place])    on coumadin; documented on monitor 06/2010  . Pneumonia 11/2011  . Sleep apnea     Past Surgical History:  Procedure Laterality Date  . ANTERIOR CERVICAL DECOMP/DISCECTOMY FUSION  01/2004  . CARDIAC CATHETERIZATION  06/22/2010   normal coronary arteries, PAF  . CATARACT EXTRACTION, BILATERAL Bilateral   . CHILD BIRTHS     X2  . DILATATION & CURETTAGE/HYSTEROSCOPY WITH MYOSURE N/A 01/15/2020   Procedure: DILATATION & CURETTAGE/HYSTEROSCOPY WITH MYOSURE;  Surgeon: Arvella Nigh, MD;  Location: [facility];  Service: Gynecology;  Laterality: N/A;  pt in wheelchair-has MS  . DILATION AND CURETTAGE OF UTERUS    . DILATION AND CURETTAGE OF UTERUS N/A 02/16/2020   Procedure: DILATATION AND CURETTAGE;  Surgeon: Lafonda Mosses, MD;  Location: [facility];  Service: Gynecology;  Laterality: N/A;  NOT GENERAL -NO INTUBATION  . INFUSION PUMP IMPLANTATION  ~ 2009    baclofen infusion in lower abd  . INTRAUTERINE DEVICE (IUD) INSERTION N/A 02/16/2020   Procedure: INTRAUTERINE DEVICE (IUD) INSERTION - MIRENA;  Surgeon: Lafonda Mosses, MD;  Location: [facility];  Service: Gynecology;  Laterality: N/A;  . PAIN PUMP REVISION N/A 07/29/2014   Procedure: Baclofen pump replacement;  Surgeon: Erline Levine, MD;  Location: [facility];  Service: Neurosurgery;  Laterality: N/A;  Baclofen pump replacement  . TONSILLECTOMY AND ADENOIDECTOMY    . VARICOSE VEIN SURGERY  ~ 1968  . VENOUS ABLATION  12/16/2010   radiofreq ablation -Dr Elisabeth Cara and [facility]  . WISDOM TOOTH EXTRACTION       Home Medications:  Prior to Admission medications   Medication Sig Start Date End Date Taking? Authorizing Provider  acetaminophen (TYLENOL) 500 MG tablet Take 500-1,000 mg by mouth every 6 (six) hours as needed for fever or headache (or pain).   Yes [provider]  ALPRAZolam (XANAX) 0.5 MG tablet Take 1 tablet (0.5 mg total) by mouth 3 (three) times daily as needed for anxiety or sleep. 02/26/20  Yes Dohmeier, Asencion Partridge, MD  baclofen (LIORESAL) 20 MG tablet Take 1 tablet (20 mg total) by mouth 2 (two) times daily as needed (if the pump needs a boost). Patient taking differently: Take 20 mg by mouth 2 (two) times daily as needed for muscle spasms.  04/15/20  Yes Dohmeier, Asencion Partridge, MD  BIOTIN PO Take 1 tablet by mouth daily.   Yes [provider]  buPROPion (WELLBUTRIN XL) 300 MG 24 hr tablet Take 1 tablet (300 mg total) by mouth daily. 08/23/17  Yes Beasley, Caren D, MD  Calcium Carb-Cholecalciferol (CALCIUM + D3 PO) Take 2 tablets by mouth in the morning and at bedtime.   Yes [provider]  cetirizine (ZYRTEC) 10 MG tablet Take 10 mg by mouth daily as needed for allergies.    Yes [provider]  Cholecalciferol (VITAMIN D3) 50 MCG (2000 UT) TABS Take 2,000 Units by mouth in the morning and at bedtime.   Yes [provider]  Coenzyme Q10 (CO Q-10 PO)  Take 1 capsule by mouth daily.    Yes [provider]  corticotropin (ACTHAR) 80 UNIT/ML injectable gel Inject 1 mL (80 Units total) into the muscle every other day. For 10 days each month. Patient taking differently: Inject 80 Units into the skin See admin instructions. Inject 80 units into the skin as directed every other day for a period of 10 days each month 12/01/19  Yes Dohmeier, Asencion Partridge, MD  diltiazem (CARDIZEM CD) 240 MG 24 hr capsule Take 1 capsule (240 mg total) by mouth daily. Patient taking differently: Take 240 mg by mouth in the morning.  01/28/20  Yes Hilty, Nadean Corwin, MD  docusate sodium (COLACE) 100 MG capsule Take 1  capsule (100 mg total) by mouth daily. Patient taking differently: Take 200 mg by mouth daily as needed for mild constipation.  08/12/16  Yes Rai, Ripudeep K, MD  famotidine (PEPCID) 20 MG tablet Take 20 mg by mouth at bedtime.  06/11/18  Yes [provider]  furosemide (LASIX) 20 MG tablet Take 1 tablet by mouth daily, may take second dose if needed for excess edema Patient taking differently: Take 20 mg by mouth in the morning and at bedtime.  09/30/19  Yes Hilty, Nadean Corwin, MD  gabapentin (NEURONTIN) 300 MG capsule TAKE 1 CAPSULE BY MOUTH UP  TO 4 TIMES DAILY Patient taking differently: Take 300 mg by mouth every 6 (six) hours.  03/02/20  Yes Dohmeier, Asencion Partridge, MD  lactulose, encephalopathy, (GENERLAC) 10 GM/15ML SOLN TAKE 15 ML BY MOUTH TWICE DAILY AS NEEDED Patient taking differently: Take 10 g by mouth See admin instructions. Take 10 grams by mouth once a day and an additional 10 grams once daily, as needed to move the bowels 03/25/20  Yes Dohmeier, Asencion Partridge, MD  levothyroxine (SYNTHROID, LEVOTHROID) 75 MCG tablet Take 75 mcg by mouth daily before breakfast.    Yes [provider]  linaclotide (LINZESS) 145 MCG CAPS capsule Take 1 capsule (145 mcg total) by mouth daily before breakfast. 04/20/20  Yes Thornton Park, MD  melatonin 5 MG TABS Take 5  mg by mouth at bedtime.    Yes [provider]  midodrine (PROAMATINE) 5 MG tablet Take 1 tablet (5 mg total) by mouth 3 (three) times daily with meals. Patient taking differently: Take 5 mg by mouth 2 (two) times daily as needed (for blood pressure).  09/30/19  Yes Hilty, Nadean Corwin, MD  nitrofurantoin (MACRODANTIN) 100 MG capsule Take 100 mg by mouth at bedtime. 04/20/20  Yes [provider]  Omega-3 Fatty Acids (FISH OIL PO) Take 1 capsule by mouth daily.   Yes [provider]  omeprazole (PRILOSEC) 20 MG capsule Take 20 mg by mouth daily before breakfast.    Yes [provider]  ondansetron (ZOFRAN) 8 MG tablet Take 8 mg by mouth every morning.  01/12/20  Yes [provider]  oxybutynin (DITROPAN) 5 MG tablet Take 5 mg by mouth 3 (three) times daily as needed for bladder spasms.  02/09/20  Yes [provider]  psyllium (METAMUCIL SMOOTH TEXTURE) 28 % packet Take 1 packet by mouth every evening.    Yes [provider]  simvastatin (ZOCOR) 10 MG tablet Take 10 mg by mouth every evening.  03/01/18  Yes [provider]  Tamsulosin HCl (FLOMAX) 0.4 MG CAPS Take 0.4 mg by mouth in the morning.    Yes [provider]  warfarin (COUMADIN) 5 MG tablet Take 1-1.5 tablets (5-7.5 mg total) by mouth See admin instructions. Take 5 mg by mouth in the late evening on Sun/Tues/Wed/Thurs/Fri/Sat and 7.5 mg on Mondays Patient taking differently: Take 2.5-5 mg by mouth See admin instructions. Take 1 tablet (5 mg totally) by mouth in the late evening on Sun/Tues/Wed/Thurs/Sat. Except 0.5 tablet ( 2.5 mg totally) by mouth on Monday, Friday 04/09/20  Yes Mariel Aloe, MD  diltiazem (CARDIZEM) 60 MG tablet Take 1 tablet (60 mg total) by mouth every 6 (six) hours as needed for up to 7 days (HR sustained >110bpm). Patient not taking: Reported on 06/04/2020 10/17/19 04/07/20  Terrilee Croak, MD  SYRINGE/NEEDLE, DISP, 1 ML (BD LUER-LOK SYRINGE) 25G X  5/8" 1 ML MISC Use as directed to inject  [place] 06/15/15   Dohmeier, Asencion Partridge, MD    Inpatient Medications: Scheduled Meds: . buPROPion  300 mg Oral Daily  . diltiazem  180 mg Oral Daily  . famotidine  20 mg Oral QHS  . furosemide  20 mg Oral Daily  . gabapentin  300 mg Oral Q6H  . lactulose  10 g Oral Daily  . levothyroxine  75 mcg Oral QAC breakfast  . linaclotide  145 mcg Oral QAC breakfast  . loratadine  10 mg Oral Daily  . melatonin  5 mg Oral QHS  . midodrine  5 mg Oral TID WC  . ondansetron  8 mg Oral q morning - 10a  . pantoprazole  40 mg Oral Daily  . psyllium  1 packet Oral QPM  . simvastatin  10 mg Oral QPM  . tamsulosin  0.4 mg Oral q AM  . warfarin  4 mg Oral ONCE-1600  . Warfarin - Pharmacist Dosing Inpatient   Does not apply q1600   Continuous Infusions: . amiodarone 30 mg/hr (06/07/20 0247)  . ceFEPime (MAXIPIME) IV 2 g (06/09/20 0810)   PRN Meds: acetaminophen **OR** acetaminophen, ALPRAZolam, baclofen, bisacodyl, docusate sodium, ondansetron **OR** ondansetron (ZOFRAN) IV, oxybutynin  Allergies:    Allergies  Allergen Reactions  . Chlorhexidine Hives and Other (See Comments)    And, sores appear where applied  . Hydrocodone Nausea And Vomiting and Other (See Comments)    Hydrocodone causes vomiting  . Oxycodone Nausea And Vomiting and Other (See Comments)    Oral oxycodone causes vomiting  . Zoloft [Sertraline] Hives, Swelling and Rash    Social History:   Social History   Socioeconomic History  . Marital status: Married    Spouse name: [name]  . Number of children: 2  . Years of education: COLLEGE  . Highest education level: Not on file  Occupational History  . Occupation: RETIRED  Tobacco Use  . Smoking status: Former Smoker    Packs/day: 0.50    Years: 7.00    Pack years: 3.50    Types: Cigarettes    Quit date: 08/21/1976    Years since quitting: 43.8  . Smokeless tobacco: Never Used  Vaping Use  . Vaping Use: Never used  Substance and  Sexual Activity  . Alcohol use: Not Currently  . Drug use: No  . Sexual activity: Not Currently  Other Topics Concern  . Not on file  Social History Narrative   Lives in [place] with Husband [name] [phone], [phone]   Currently in rehab   Social Determinants of Health   Financial Resource Strain:   . Difficulty of Paying Living Expenses: Not on file  Food Insecurity:   . Worried About Charity fundraiser in the Last Year: Not on file  . Ran Out of Food in the Last Year: Not on file  Transportation Needs:   . Lack of Transportation (Medical): Not on file  . Lack of Transportation (Non-Medical): Not on file  Physical Activity:   . Days of Exercise per Week: Not on file  . Minutes of Exercise per Session: Not on file  Stress:   . Feeling of Stress : Not on file  Social Connections:   . Frequency of Communication with Friends and Family: Not on file  . Frequency of Social Gatherings with Friends and Family: Not on file  . Attends Religious Services: Not on file  . Active Member of Clubs or Organizations: Not on file  . Attends Archivist Meetings: Not  on file  . Marital Status: Not on file  Intimate Partner Violence:   . Fear of Current or Ex-Partner: Not on file  . Emotionally Abused: Not on file  . Physically Abused: Not on file  . Sexually Abused: Not on file    Family History:   Family History  Problem Relation Age of Onset  . Coronary artery disease Father        at age 45  . Hyperlipidemia Father   . Thyroid disease Father   . Coronary artery disease Maternal Grandmother   . Depression Maternal Grandmother   . Cancer Paternal Grandmother        breast  . Depression Mother   . Sudden death Mother   . Anxiety disorder Mother   . Alcoholism Son      ROS:  Please see the history of present illness.  All other ROS reviewed and negative.     Physical Exam/Data:   Vitals:   06/08/20 2316 06/09/20 0514 06/09/20 0737 06/09/20 1200  BP: 98/62 (!) 110/44  (!) 104/55 (!) 110/55  Pulse: 91 96 80 98  Resp: 12 18 20    Temp: 98.6 F (37 C)  98.9 F (37.2 C) 98.3 F (36.8 C)  TempSrc: Oral  Oral Oral  SpO2: 98% 95% 93% 92%    Intake/Output Summary (Last 24 hours) at 06/09/2020 1309 Last data filed at 06/09/2020 1255 Gross per 24 hour  Intake --  Output 4050 ml  Net -4050 ml   Last 3 Weights 06/04/2020 04/08/2020 04/07/2020  Weight (lbs) 187 lb 6.3 oz 179 lb 10.8 oz 182 lb 15.7 oz  Weight (kg) 85 kg 81.5 kg 83 kg     There is no height or weight on file to calculate BMI.  General:  Well nourished, well developed, in no acute distress HEENT: normal Lymph: no adenopathy Neck: mild JVD Endocrine:  No thryomegaly Vascular: No carotid bruits; FA pulses 2+ bilaterally without bruits  Cardiac:  normal S1, S2; Irreg Irreg; no murmur  Lungs:  clear to auscultation bilaterally, no wheezing, rhonchi or rales  Abd: soft, nontender, no hepatomegaly  Ext: no edema Musculoskeletal:  No deformities, BUE and BLE strength normal and equal Skin: warm and dry  Neuro:  CNs 2-12 intact, no focal abnormalities noted Psych:  Normal affect   EKG:  The EKG was personally reviewed and demonstrates:  pending Telemetry:  Telemetry was personally reviewed and demonstrates:  Afib with rates 90-110  Relevant CV Studies:  Echo 10/10/19 1. Left ventricular ejection fraction, by estimation, is 55 to 60%. The  left ventricle has normal function. The left ventricle has no regional  wall motion abnormalities. Left ventricular diastolic function could not  be evaluated.  2. Right ventricular systolic function is normal. The right ventricular  size is normal. There is normal pulmonary artery systolic pressure. The  estimated right ventricular systolic pressure is 72.0 mmHg.  3. Left atrial size was severely dilated.  4. Right atrial size was mild to moderately dilated.  5. The mitral valve is grossly normal. Mild to moderate mitral valve  regurgitation.  6.  The aortic valve is tricuspid. Aortic valve regurgitation is not  visualized. No aortic stenosis is present.  7. The inferior vena cava is dilated in size with <50% respiratory  variability, suggesting right atrial pressure of 15 mmHg.   Cardiac cath 06/22/2010   Laboratory Data:  High Sensitivity Troponin:  No results for input(s): TROPONINIHS in the last 720 hours.  Chemistry Recent Labs  Lab 06/07/20 0409 06/08/20 0003 06/09/20 0231  NA 143 140 140  K 4.0 4.5 4.3  CL 105 98 97*  CO2 29 34* 36*  GLUCOSE 91 112* 90  BUN 14 14 16   CREATININE 0.48 0.54 0.59  CALCIUM 8.1* 8.3* 8.3*  GFRNONAA >60 >60 >60  ANIONGAP 9 8 7     Recent Labs  Lab 06/04/20 0052 06/04/20 1911 06/05/20 0035  PROT 5.7* 5.1* 4.8*  ALBUMIN 3.1* 2.7* 2.6*  AST 15 13* 15  ALT 18 15 15   ALKPHOS 45 42 37*  BILITOT 0.3 0.9 0.9   Hematology Recent Labs  Lab 06/07/20 0409 06/08/20 0003 06/09/20 0231  WBC 6.9 6.3 5.8  RBC 3.41* 3.63* 3.45*  HGB 10.7* 11.3* 11.0*  HCT 33.6* 35.4* 34.2*  MCV 98.5 97.5 99.1  MCH 31.4 31.1 31.9  MCHC 31.8 31.9 32.2  RDW 13.2 12.9 12.8  PLT 174 186 214   BNP Recent Labs  Lab 06/07/20 0409  BNP 119.7*    DDimer No results for input(s): DDIMER in the last 168 hours.   Radiology/Studies:  DG CHEST PORT 1 VIEW  Result Date: 06/09/2020 CLINICAL DATA:  Dyspnea. EXAM: PORTABLE CHEST 1 VIEW COMPARISON:  06/05/2020 FINDINGS: Cardiac enlargement is unchanged. Mild vascular congestion without edema. Mild bibasilar airspace disease unchanged. No significant pleural effusion. IMPRESSION: Cardiac enlargement with vascular congestion.  Negative for edema Mild bibasilar atelectasis/infiltrate unchanged. Electronically Signed   By: Franchot Gallo M.D.   On: 06/09/2020 10:41     Assessment and Plan:   Permanent Afib RVR - In the setting of sepsis, mild volume overload, acute respiratory failure - Rates on up 160 on admission - started on IV amiodarone and po dilt. IV  amio stopped - PTA patient was on dilt 240mg  daily and had 60mg  to take as needed. Plan to increase dilt to home dose as BP allows. She is currently on 180mg  daily. Also on midodrine 5mg  TID - coumadin per pharmacy - TSH earlier this year normal - Echo from Feb 2021 showed LVEF 55-60%, no WMA, severely dilated LA, mild to mod RA, mild to mod MR. - Keep Mag>2 and K+>4 - Heart rates 90-110. Pt says heart rates are normally in the 70s. She is asymptomatic - continue rate control with dilt as BP allows and try and decrease midodrine. Suspect elevated rates 2/2 to other acute issues.   Sepsis/AMS - unknown etiology, suspected UTI - initial UA unremarkable. Culture+ bacteria - BC x2 negative - Lactic acid wnl - CXR with possible vascular congestion and unchanged infiltrate  Acute respiratory failure/Acute CHF - multifactorial with possible pna and Acute CHF - BNP mildly up to 119 - No longer on supplemental O2 - Pt has some coughing today and CXR with vascular congestion - Pt also feel abdomen is fuller than normal. She was given IV lasix 20 mg yesterday and started on 20mg  today. Agree with restarting lasix.   Orthostatic hypotension - s/p IVF - home midodrine restarted for hypotension - PTA pt said she rarely needed midodrine - BP improving, most recent 110/55  OSA  - on BIPAP  For questions or updates, please contact [place] Please consult [email or web address] for contact info under    Signed, Cadence Ninfa Meeker, PA-C  06/09/2020 1:09 PM   Patient seen and examined  I agree with findings as noted above by C Furth Pt is a 74 yo with hx of afib and MS  Followed by C Hilty  in clinic   The pt has been maintained at home on diltiazem 240 CD with prn 60 and prn midodrine(takes rarely)    Admitted for urosepsis   Asked to see re afib with RVR and hypotension Echo in Feb 2021 LVEF ws normal   The pt denies dizziness  No palpitations She does complain of bloating of her abdomen Feels  like she has fluid    On exam Lungs  RElatively clear Cardiac Irreg irreg   No S3   No signif murmus  Abd is supple  Distended  Nontender     Ext Ankles re without edema  Afib   Rates 90s to 110s   She is on 180 CD Dilt today   I would stop and start short acting tomorrow  She had dip in BP to 80s   DOnt want to get stuck with CD if BP drops Follow   Can dose prn 30 mg tomorrow for HR over 120  Acute on chronic diastolic CHF   I would give 1 dose of IV lasix again today   Follow response  Orthstatic hypotension   Agree with 5 tid Continue for now   WIll reassess in am    Dorris Carnes MD

## 2020-06-09 NOTE — Progress Notes (Signed)
ANTICOAGULATION CONSULT NOTE - Consult  Pharmacy Consult for warfarin Indication: atrial fibrillation  Allergies  Allergen Reactions  . Chlorhexidine Hives and Other (See Comments)    And, sores appear where applied  . Hydrocodone Nausea And Vomiting and Other (See Comments)    Hydrocodone causes vomiting  . Oxycodone Nausea And Vomiting and Other (See Comments)    Oral oxycodone causes vomiting  . Zoloft [Sertraline] Hives, Swelling and Rash    Patient Measurements:    Vital Signs: Temp: 98.9 F (37.2 C) (10/20 0737) Temp Source: Oral (10/20 0737) BP: 104/55 (10/20 0737) Pulse Rate: 80 (10/20 0737)  Labs: Recent Labs    06/07/20 0409 06/07/20 0409 06/08/20 0003 06/09/20 0231  HGB 10.7*   < > 11.3* 11.0*  HCT 33.6*  --  35.4* 34.2*  PLT 174  --  186 214  LABPROT 21.2*  --  22.1* 26.2*  INR 1.9*  --  2.0* 2.5*  CREATININE 0.48  --  0.54 0.59   < > = values in this interval not displayed.    Estimated Creatinine Clearance: 64.7 mL/min (by C-G formula based on SCr of 0.59 mg/dL).  Assessment: 44 YOF presenting with palpitations, history of Afib on anticoagulation with warfarin. Her last dose prior to admission was on 10/13. She did not receive warfarin on 10/14 or 10/15.   Today, INR 2.5 -therapeutic. Her warfarin requirements are likely decreased from home needs due to acute illness, antibiotic therapy (metronidazole), and amiodarone was started 10/16.  Good appetite noted on cardiac diet - unsure of % intake.   PTA dosing: 5mg  daily except Monday/Friday 2.5mg   Goal of Therapy:  INR 2-3 Monitor platelets by anticoagulation protocol: Yes   Plan:  Warfarin 4mg  po x1 again tonight Monitor daily INR, CBC, clinical course, s/sx of bleed, drug interactions  Barth Kirks, PharmD, BCPS, BCCCP Clinical Pharmacist [phone]  Please check AMION for all [place] numbers  06/09/2020 8:37 AM

## 2020-06-10 ENCOUNTER — Inpatient Hospital Stay (HOSPITAL_COMMUNITY): Payer: Medicare Other

## 2020-06-10 DIAGNOSIS — M7989 Other specified soft tissue disorders: Secondary | ICD-10-CM

## 2020-06-10 DIAGNOSIS — I361 Nonrheumatic tricuspid (valve) insufficiency: Secondary | ICD-10-CM

## 2020-06-10 DIAGNOSIS — J9601 Acute respiratory failure with hypoxia: Secondary | ICD-10-CM | POA: Diagnosis not present

## 2020-06-10 DIAGNOSIS — A419 Sepsis, unspecified organism: Secondary | ICD-10-CM | POA: Diagnosis not present

## 2020-06-10 DIAGNOSIS — I4819 Other persistent atrial fibrillation: Secondary | ICD-10-CM

## 2020-06-10 DIAGNOSIS — I340 Nonrheumatic mitral (valve) insufficiency: Secondary | ICD-10-CM

## 2020-06-10 DIAGNOSIS — R0600 Dyspnea, unspecified: Secondary | ICD-10-CM | POA: Diagnosis not present

## 2020-06-10 DIAGNOSIS — R6520 Severe sepsis without septic shock: Secondary | ICD-10-CM | POA: Diagnosis not present

## 2020-06-10 LAB — BASIC METABOLIC PANEL
Anion gap: 8 (ref 5–15)
BUN: 14 mg/dL (ref 8–23)
CO2: 35 mmol/L — ABNORMAL HIGH (ref 22–32)
Calcium: 8.5 mg/dL — ABNORMAL LOW (ref 8.9–10.3)
Chloride: 98 mmol/L (ref 98–111)
Creatinine, Ser: 0.4 mg/dL — ABNORMAL LOW (ref 0.44–1.00)
GFR, Estimated: >60 mL/min (ref >60)
Glucose, Bld: 82 mg/dL (ref 70–99)
Potassium: 4.2 mmol/L (ref 3.5–5.1)
Sodium: 141 mmol/L (ref 135–145)

## 2020-06-10 LAB — CBC WITH DIFFERENTIAL/PLATELET
Abs Immature Granulocytes: 0.02 10*3/uL (ref 0.00–0.07)
Basophils Absolute: 0.1 10*3/uL (ref 0.0–0.1)
Basophils Relative: 1 %
Eosinophils Absolute: 0.5 10*3/uL (ref 0.0–0.5)
Eosinophils Relative: 7 %
HCT: 34 % — ABNORMAL LOW (ref 36.0–46.0)
Hemoglobin: 11.1 g/dL — ABNORMAL LOW (ref 12.0–15.0)
Immature Granulocytes: 0 %
Lymphocytes Relative: 30 %
Lymphs Abs: 2 10*3/uL (ref 0.7–4.0)
MCH: 31.8 pg (ref 26.0–34.0)
MCHC: 32.6 g/dL (ref 30.0–36.0)
MCV: 97.4 fL (ref 80.0–100.0)
Monocytes Absolute: 1.1 10*3/uL — ABNORMAL HIGH (ref 0.1–1.0)
Monocytes Relative: 16 %
Neutro Abs: 3.1 10*3/uL (ref 1.7–7.7)
Neutrophils Relative %: 46 %
Platelets: 220 10*3/uL (ref 150–400)
RBC: 3.49 MIL/uL — ABNORMAL LOW (ref 3.87–5.11)
RDW: 12.7 % (ref 11.5–15.5)
WBC: 6.7 10*3/uL (ref 4.0–10.5)
nRBC: 0 % (ref 0.0–0.2)

## 2020-06-10 LAB — ECHOCARDIOGRAM COMPLETE
Calc EF: 51.9 %
Height: 63 in
S' Lateral: 3.79 cm
Single Plane A2C EF: 49.6 %
Single Plane A4C EF: 54 %
Weight: 2998.26 oz

## 2020-06-10 LAB — PROTIME-INR
INR: 2.5 — ABNORMAL HIGH (ref 0.8–1.2)
Prothrombin Time: 25.9 seconds — ABNORMAL HIGH (ref 11.4–15.2)

## 2020-06-10 MED ORDER — WARFARIN SODIUM 2 MG PO TABS
4.0000 mg | ORAL_TABLET | Freq: Once | ORAL | Status: AC
  Administered 2020-06-10: 4 mg via ORAL
  Filled 2020-06-10: qty 2

## 2020-06-10 NOTE — Progress Notes (Signed)
Right lower extremity venous duplex has been completed. Preliminary results can be found in CV Proc through chart review.   06/10/20 1:01 PM Toni [name] RVT

## 2020-06-10 NOTE — Progress Notes (Signed)
Progress Note  Patient Name: Toni [name] Date of Encounter: 06/10/2020  [facility] Cardiologist: Pixie Casino, MD   Subjective   Denies SOB   Feels less bloated   Inpatient Medications    Scheduled Meds: . buPROPion  300 mg Oral Daily  . cephALEXin  500 mg Oral Q8H  . diltiazem  60 mg Oral TID  . famotidine  20 mg Oral QHS  . furosemide  20 mg Oral Daily  . gabapentin  300 mg Oral Q6H  . lactulose  10 g Oral Daily  . levothyroxine  75 mcg Oral QAC breakfast  . linaclotide  145 mcg Oral QAC breakfast  . loratadine  10 mg Oral Daily  . melatonin  5 mg Oral QHS  . midodrine  5 mg Oral TID WC  . ondansetron  8 mg Oral q morning - 10a  . pantoprazole  40 mg Oral Daily  . psyllium  1 packet Oral QPM  . simvastatin  10 mg Oral QPM  . tamsulosin  0.4 mg Oral q AM  . Warfarin - Pharmacist Dosing Inpatient   Does not apply q1600   Continuous Infusions: . amiodarone 30 mg/hr (06/07/20 0247)   PRN Meds: acetaminophen **OR** acetaminophen, ALPRAZolam, baclofen, bisacodyl, docusate sodium, ondansetron **OR** ondansetron (ZOFRAN) IV, oxybutynin   Vital Signs    Vitals:   06/10/20 1110 06/10/20 1216 06/10/20 1300 06/10/20 1631  BP: (!) 94/52 (!) 98/49 (!) 103/57 (!) 113/56  Pulse: 75 74 89 (!) 118  Resp:  14 18 19   Temp:  98 F (36.7 C)  98.5 F (36.9 C)  TempSrc:  Axillary  Axillary  SpO2:  96% 97% 93%    Intake/Output Summary (Last 24 hours) at 06/10/2020 1856 Last data filed at 06/10/2020 1632 Gross per 24 hour  Intake --  Output 4650 ml  Net -4650 ml   Last 3 Weights 06/04/2020 04/08/2020 04/07/2020  Weight (lbs) 187 lb 6.3 oz 179 lb 10.8 oz 182 lb 15.7 oz  Weight (kg) 85 kg 81.5 kg 83 kg      Telemetry    Afib  90s to 100s  - Personally Reviewed  ECG    None  Personally Reviewed  Physical Exam   GEN: Obese 74 yo  No acute distress.   Neck: No JVD Cardiac: Irreg irreg , no murmurs,  Respiratory: Clear to auscultation bilaterally. GI:  Soft, nontender, non-distended  MS: No edema;  Neuro:  Nonfocal  Psych: Normal affect   Labs    High Sensitivity Troponin:  No results for input(s): TROPONINIHS in the last 720 hours.    Chemistry Recent Labs  Lab 06/04/20 0052 06/04/20 0052 06/04/20 1911 06/04/20 1911 06/05/20 0035 06/06/20 0132 06/08/20 0003 06/09/20 0231 06/10/20 0116  NA 138   < > 139   < > 139   < > 140 140 141  K 4.3   < > 4.0   < > 3.7   < > 4.5 4.3 4.2  CL 100   < > 100   < > 101   < > 98 97* 98  CO2 32   < > 30   < > 29   < > 34* 36* 35*  GLUCOSE 101*   < > 79   < > 138*   < > 112* 90 82  BUN 15   < > 13   < > 12   < > 14 16 14   CREATININE 0.76   < >  0.39*   < > 0.48   < > 0.54 0.59 0.40*  CALCIUM 8.6*   < > 8.4*   < > 8.3*   < > 8.3* 8.3* 8.5*  PROT 5.7*  --  5.1*  --  4.8*  --   --   --   --   ALBUMIN 3.1*  --  2.7*  --  2.6*  --   --   --   --   AST 15  --  13*  --  15  --   --   --   --   ALT 18  --  15  --  15  --   --   --   --   ALKPHOS 45  --  42  --  37*  --   --   --   --   BILITOT 0.3  --  0.9  --  0.9  --   --   --   --   GFRNONAA >60   < > >60   < > >60   < > >60 >60 >60  ANIONGAP 6   < > 9   < > 9   < > 8 7 8    < > = values in this interval not displayed.     Hematology Recent Labs  Lab 06/08/20 0003 06/09/20 0231 06/10/20 0116  WBC 6.3 5.8 6.7  RBC 3.63* 3.45* 3.49*  HGB 11.3* 11.0* 11.1*  HCT 35.4* 34.2* 34.0*  MCV 97.5 99.1 97.4  MCH 31.1 31.9 31.8  MCHC 31.9 32.2 32.6  RDW 12.9 12.8 12.7  PLT 186 214 220    BNP Recent Labs  Lab 06/07/20 0409  BNP 119.7*     DDimer No results for input(s): DDIMER in the last 168 hours.   Radiology    DG CHEST PORT 1 VIEW  Result Date: 06/09/2020 CLINICAL DATA:  Dyspnea. EXAM: PORTABLE CHEST 1 VIEW COMPARISON:  06/05/2020 FINDINGS: Cardiac enlargement is unchanged. Mild vascular congestion without edema. Mild bibasilar airspace disease unchanged. No significant pleural effusion. IMPRESSION: Cardiac enlargement with  vascular congestion.  Negative for edema Mild bibasilar atelectasis/infiltrate unchanged. Electronically Signed   By: Franchot Gallo M.D.   On: 06/09/2020 10:41   ECHOCARDIOGRAM COMPLETE  Result Date: 06/10/2020    ECHOCARDIOGRAM REPORT   Patient Name:   Toni [name] Date of Exam: 06/10/2020 Medical Rec #:  [id number]        Height:       63.0 in Accession #:    [id number]       Weight:       187.4 lb Date of Birth:  01-01-1946         BSA:          1.881 m Patient Age:    74 years         BP:           108/46 mmHg Patient Gender: F                HR:           79 bpm. Exam Location:  Inpatient Procedure: 2D Echo, Cardiac Doppler and Color Doppler Indications:    Dyspnea 786.09 / R06.00  History:        Patient has prior history of Echocardiogram examinations, most                 recent 10/10/2019. CHF, Arrythmias:Atrial Fibrillation; Risk  Factors:Hypertension, Diabetes, Dyslipidemia, Sleep Apnea and                 Former Smoker. GERD.  Sonographer:    Vickie Epley RDCS Referring Phys: [id number] [place]  1. Left ventricular ejection fraction, by estimation, is 55 to 60%. The left ventricle has normal function. The left ventricle has no regional wall motion abnormalities. The left ventricular internal cavity size was mildly dilated. Left ventricular diastolic function could not be evaluated.  2. Right ventricular systolic function is normal. The right ventricular size is normal. There is normal pulmonary artery systolic pressure. The estimated right ventricular systolic pressure is 73.2 mmHg.  3. The mitral valve is normal in structure. Mild mitral valve regurgitation. No evidence of mitral stenosis.  4. The aortic valve is normal in structure. Aortic valve regurgitation is not visualized. No aortic stenosis is present.  5. The inferior vena cava is dilated in size with <50% respiratory variability, suggesting right atrial pressure of 15 mmHg. FINDINGS  Left Ventricle: Left  ventricular ejection fraction, by estimation, is 55 to 60%. The left ventricle has normal function. The left ventricle has no regional wall motion abnormalities. The left ventricular internal cavity size was mildly dilated. There is  no left ventricular hypertrophy. Left ventricular diastolic function could not be evaluated. Right Ventricle: The right ventricular size is normal. No increase in right ventricular wall thickness. Right ventricular systolic function is normal. There is normal pulmonary artery systolic pressure. The tricuspid regurgitant velocity is 1.35 m/s, and  with an assumed right atrial pressure of 15 mmHg, the estimated right ventricular systolic pressure is 20.2 mmHg. Left Atrium: Left atrial size was normal in size. Right Atrium: Right atrial size was normal in size. Pericardium: There is no evidence of pericardial effusion. Mitral Valve: The mitral valve is normal in structure. Mild mitral valve regurgitation. No evidence of mitral valve stenosis. Tricuspid Valve: The tricuspid valve is normal in structure. Tricuspid valve regurgitation is mild . No evidence of tricuspid stenosis. Aortic Valve: The aortic valve is normal in structure. Aortic valve regurgitation is not visualized. No aortic stenosis is present. Pulmonic Valve: The pulmonic valve was normal in structure. Pulmonic valve regurgitation is not visualized. No evidence of pulmonic stenosis. Aorta: The aortic root is normal in size and structure. Venous: The inferior vena cava is dilated in size with less than 50% respiratory variability, suggesting right atrial pressure of 15 mmHg. IAS/Shunts: No atrial level shunt detected by color flow Doppler.  LEFT VENTRICLE PLAX 2D LVIDd:         5.49 cm LVIDs:         3.79 cm LV PW:         0.76 cm LV IVS:        0.74 cm LVOT diam:     1.90 cm LV SV:         60 LV SV Index:   32 LVOT Area:     2.84 cm  LV Volumes (MOD) LV vol d, MOD A2C: 102.0 ml LV vol d, MOD A4C: 143.0 ml LV vol s, MOD A2C:  51.4 ml LV vol s, MOD A4C: 65.8 ml LV SV MOD A2C:     50.6 ml LV SV MOD A4C:     143.0 ml LV SV MOD BP:      64.6 ml RIGHT VENTRICLE TAPSE (M-mode): 1.9 cm LEFT ATRIUM           Index  RIGHT ATRIUM           Index LA diam:      3.60 cm 1.91 cm/m  RA Area:     16.20 cm LA Vol (A2C): 43.5 ml 23.13 ml/m RA Volume:   34.80 ml  18.50 ml/m LA Vol (A4C): 55.9 ml 29.72 ml/m  AORTIC VALVE LVOT Vmax:   112.00 cm/s LVOT Vmean:  71.300 cm/s LVOT VTI:    0.212 m  AORTA Ao Root diam: 2.50 cm TRICUSPID VALVE TR Peak grad:   7.3 mmHg TR Vmax:        135.00 cm/s  SHUNTS Systemic VTI:  0.21 m Systemic Diam: 1.90 cm Ena Dawley MD Electronically signed by Ena Dawley MD Signature Date/Time: 06/10/2020/3:12:02 PM    Final    VAS [name] LOWER EXTREMITY VENOUS (DVT)  Result Date: 06/10/2020  Lower Venous DVT Study Indications: Swelling.  Risk Factors: None identified. Comparison Study: No prior studies. Performing Technologist: Oliver Hum RVT  Examination Guidelines: A complete evaluation includes B-mode imaging, spectral Doppler, color Doppler, and power Doppler as needed of all accessible portions of each vessel. Bilateral testing is considered an integral part of a complete examination. Limited examinations for reoccurring indications may be performed as noted. The reflux portion of the exam is performed with the patient in reverse Trendelenburg.  +---------+---------------+---------+-----------+----------+--------------+ RIGHT    CompressibilityPhasicitySpontaneityPropertiesThrombus Aging +---------+---------------+---------+-----------+----------+--------------+ CFV      Full           Yes      Yes                                 +---------+---------------+---------+-----------+----------+--------------+ SFJ      Full                                                        +---------+---------------+---------+-----------+----------+--------------+ FV Prox  Full                                                         +---------+---------------+---------+-----------+----------+--------------+ FV Mid   Full                                                        +---------+---------------+---------+-----------+----------+--------------+ FV DistalFull                                                        +---------+---------------+---------+-----------+----------+--------------+ PFV      Full                                                        +---------+---------------+---------+-----------+----------+--------------+ POP  Full           Yes      Yes                                 +---------+---------------+---------+-----------+----------+--------------+ PTV      Full                                                        +---------+---------------+---------+-----------+----------+--------------+ PERO     Full                                                        +---------+---------------+---------+-----------+----------+--------------+   +----+---------------+---------+-----------+----------+--------------+ LEFTCompressibilityPhasicitySpontaneityPropertiesThrombus Aging +----+---------------+---------+-----------+----------+--------------+ CFV Full           Yes      Yes                                 +----+---------------+---------+-----------+----------+--------------+     Summary: RIGHT: - There is no evidence of deep vein thrombosis in the lower extremity.  - No cystic structure found in the popliteal fossa.  LEFT: - No evidence of common femoral vein obstruction.  *See table(s) above for measurements and observations. Electronically signed by Servando Snare MD on 06/10/2020 at 4:10:43 PM.    Final     Cardiac Studies   Echo 10/21  1. Left ventricular ejection fraction, by estimation, is 55 to 60%. The left ventricle has normal function. The left ventricle has no regional wall motion abnormalities. The left  ventricular internal cavity size was mildly dilated. Left ventricular diastolic function could not be evaluated. 2. Right ventricular systolic function is normal. The right ventricular size is normal. There is normal pulmonary artery systolic pressure. The estimated right ventricular systolic pressure is 62.2 mmHg. 3. The mitral valve is normal in structure. Mild mitral valve regurgitation. No evidence of mitral stenosis. 4. The aortic valve is normal in structure. Aortic valve regurgitation is not visualized. No aortic stenosis is present. 5. The inferior vena cava is dilated in size with <50% respiratory variability, suggesting right atrial pressure of 15 mmHg.  Patient Profile     Toni [name] is a 74 y.o. female with a hx of MS, paroxysmal afib on coumadin, morbid obesity, varicose veins with venous insufficiency, OSA on Bipap, dyslipidemia who is being seen today for the evaluation of Afib RVR at the request of Dr. Pietro Cassis.  Assessment & Plan    1  Atrial fibrllation  Rates remain 90s to 100s   BP is marginal at times  I would keep on 60 po tid dilt today and then give 180 CD tomorrow  2  Hypotension    BP overall OK on 5 mg midodrine (7/11/2)  Continue at D/C  3  Diastolic CHF    Volume improved after lasix given yesterday Agree with switching to PO   Lasix    Watch Na      For questions or updates, please contact [facility] [place] Please consult [email or web address] for contact info under  Signed, Dorris Carnes, MD  06/10/2020, 6:56 PM

## 2020-06-10 NOTE — Progress Notes (Signed)
  Echocardiogram 2D Echocardiogram has been performed.  Toni [name] 06/10/2020, 2:43 PM

## 2020-06-10 NOTE — Consult Note (Signed)
   [facility] Inpatient Consult   06/10/2020  Toni [name] 07/21/1946 [id number]   [place] Organization [ACO] Patient:  Medicare NextGen   Patient screened for high risk score.  Review of patient's medical record reveals patient is currently recommended for home with home health care.  Primary Care Provider is Avva, Steva Ready, MD this provider is listed to provide the transition of care [TOC] for post hospital follow up.   Plan:  Continue to follow progress and disposition to assess for post hospital care management needs.  Review of inpatient Transition of Care team notes and contacts as well for needs.  Please place a [facility] Care Management consult as appropriate and for questions contact:   Natividad Brood, RN BSN [facility] Liaison  [phone] business mobile phone Toll free office [phone]  Fax number: [phone] [email or web address]

## 2020-06-10 NOTE — Progress Notes (Signed)
ANTICOAGULATION CONSULT NOTE - Consult  Pharmacy Consult for warfarin Indication: atrial fibrillation  Allergies  Allergen Reactions  . Chlorhexidine Hives and Other (See Comments)    And, sores appear where applied  . Hydrocodone Nausea And Vomiting and Other (See Comments)    Hydrocodone causes vomiting  . Oxycodone Nausea And Vomiting and Other (See Comments)    Oral oxycodone causes vomiting  . Zoloft [Sertraline] Hives, Swelling and Rash    Patient Measurements:    Vital Signs: Temp: 98.7 F (37.1 C) (10/21 0722) Temp Source: Axillary (10/21 0722) BP: 103/48 (10/21 0722) Pulse Rate: 84 (10/21 0722)  Labs: Recent Labs    06/08/20 0003 06/08/20 0003 06/09/20 0231 06/10/20 0116  HGB 11.3*   < > 11.0* 11.1*  HCT 35.4*  --  34.2* 34.0*  PLT 186  --  214 220  LABPROT 22.1*  --  26.2* 25.9*  INR 2.0*  --  2.5* 2.5*  CREATININE 0.54  --  0.59 0.40*   < > = values in this interval not displayed.    Estimated Creatinine Clearance: 64.7 mL/min (A) (by C-G formula based on SCr of 0.4 mg/dL (L)).  Assessment: Toni [name] presenting with palpitations, history of Afib on anticoagulation with warfarin. Her last dose prior to admission was on 10/13. She did not receive warfarin on 10/14 or 10/15.   Today, INR 2.5 -therapeutic. Her warfarin requirements are likely decreased from home needs due to acute illness, antibiotic therapy (metronidazole), and amiodarone was started 10/16.  Good appetite noted on cardiac diet - unsure of % intake.   PTA dosing: 5mg  daily except Monday/Friday 2.5mg   Goal of Therapy:  INR 2-3 Monitor platelets by anticoagulation protocol: Yes   Plan:  Warfarin 4mg  po x1 again tonight Monitor daily INR, CBC, clinical course, s/sx of bleed, drug interactions  Barth Kirks, PharmD, BCPS, BCCCP Clinical Pharmacist [phone]  Please check AMION for all [place] numbers  06/10/2020 8:20 AM

## 2020-06-10 NOTE — Plan of Care (Signed)
Not reported fall. Pt follows directions and maintain safety protocol.

## 2020-06-10 NOTE — Progress Notes (Signed)
OT Cancellation Note  Patient Details Name: Toni [name] MRN: [id number] DOB: November 29, 1945   Cancelled Treatment:    Reason Eval/Treat Not Completed: Patient at procedure or test/ unavailable. Off unit. Will follow and see as able.   Jolaine Artist, OT Acute Rehabilitation Services Pager [phone] Office [phone]   Delight Stare 06/10/2020, 2:23 PM

## 2020-06-10 NOTE — Progress Notes (Signed)
From Wheelchair order:  Patient suffers from multiple sclerosis, impaired mobility, bed and wheelchair bound status which impairs their ability to perform daily activities like bathing, dressing, feeding, grooming, and toileting in the home. A cane, crutch, or walker will not resolve issue with performing activities of daily living. A wheelchair will allow patient to safely perform daily activities. Patient can safely propel the wheelchair in the home or has a caregiver who can provide assistance. Length of need Lifetime.  Accessories: elevating leg rests (ELRs), wheel locks, extensions and anti-tippers.  Wheelchair with reclining back

## 2020-06-10 NOTE — Progress Notes (Signed)
PROGRESS NOTE  Toni [name]  DOB: Nov 14, 1945  PCP: Prince Solian, MD AJG:[id number]  DOA: 06/04/2020  LOS: 6 days   Chief Complaint  Patient presents with   Fever   Altered Mental Status    Brief narrative: Toni [name] is a 74 y.o. female with PMH of multiple sclerosis, bedbound status, A. fib on Coumadin, sleep apnea on nocturnal BiPAP, frequent UTIs who had Covid in January 2021 and has been physically weak since then. Patient was brought from home to the ED on 06/04/2020 with complaint of palpitation, fever and progressive symptoms.  EMS noted a temperature of 100, her oxygen saturation in 70s. In the ED, she had a fever of 101.3, initial BP of 87/58 which improved after bolus of IV fluid. WBC count was 10.4, INR 2.7 Chest x-ray negative. CT abdomen pelvis showed constipation and a known small renal cell carcinoma. Covid PCR negative.  Influenza swab negative. She was started on sepsis pathway with empiric antibiotics, IV fluids and admitted for further evaluation and management.  Subjective: Patient was seen and examined this morning.   Feels better.  Mental status normal.  Heart rate controlled.  Assessment/Plan: Sepsis - POA Recurrent urinary tract infection -Patient presented with fever, tachycardia, hypotension, history of recurrent UTI on suppressive Macrobid. -Chest x-ray normal.  No other source of infection identified. -Sepsis parameters improved with IV fluid, IV antibiotics. -Urine culture showed 70,000 CFU per mL of multiple species.  However because of her history of recurrent UTIs and improvement with antibiotics, I will treat her as a true case of sepsis secondary to UTI. -Blood culture negative. -10/20, switched from IV cefepime to oral Keflex. -Patient follows up with urologist and is on suppressive Macrobid daily which is currently on hold.  Acute respiratory failure with hypoxia Central sleep apnea -No evidence of infection on chest x-ray.    -Echocardiogram February 2021 with normal EF, normal LV function and no wall motion abnormality.  Obtain repeat echocardiogram. -Per patient's daughter, she has central sleep apnea which probably was responsible for her low saturation of 70% on EMS evaluation. -Initially required supplemental oxygen.  She was given IV Lasix yesterday.  Currently on oral Lasix.  Currently off oxygen in the day.   -Continue BiPAP at night  History of hypotension Orthostatic hypotension -Reports hypotension since diagnosis of Covid last year -Received adequate IV hydration. -Continue home midodrine  Acute metabolic encephalopathy -Resolved.  Mental status back to baseline now.  Permanent A. Fib with RVR -In the setting of sepsis, mild volume overload and respiratory failure, patient heart rate is running elevated.   -Prior to admission, patient was on Cardizem 240 mg daily.   -Cardiology consult obtained.  Cardizem dose is being adjusted. -Continue Coumadin to target INR 2-3. -Pending echocardiogram.  Chronic constipation Continue bowel regimen  Multiple sclerosis Continue home management  Right calf tenderness -Bilateral lower extremities ultrasound duplex scan ordered to rule out DVT.  Mobility: Bedbound status.  PT evaluation for upper extremity strength Code Status:   Code Status: Full Code  Nutritional status: There is no height or weight on file to calculate BMI.     Diet Order            Diet Heart Room service appropriate? Yes; Fluid consistency: Thin  Diet effective now                 DVT prophylaxis:  warfarin (COUMADIN) tablet 4 mg   Antimicrobials:  Oral Keflex Fluid: None Consultants: Cardiology  Family Communication:  Daughter at bedside  Status is: Inpatient  Remains inpatient appropriate because: Remains in RVR.  Blood pressure low   Dispo: The patient is from: Home              Anticipated d/c is to: Home likely tomorrow              Anticipated d/c  date is: 2 days              Patient currently is not medically stable to d/c.       Infusions:   amiodarone 30 mg/hr (06/07/20 0247)    Scheduled Meds:  buPROPion  300 mg Oral Daily   cephALEXin  500 mg Oral Q8H   diltiazem  60 mg Oral TID   famotidine  20 mg Oral QHS   furosemide  20 mg Oral Daily   gabapentin  300 mg Oral Q6H   lactulose  10 g Oral Daily   levothyroxine  75 mcg Oral QAC breakfast   linaclotide  145 mcg Oral QAC breakfast   loratadine  10 mg Oral Daily   melatonin  5 mg Oral QHS   midodrine  5 mg Oral TID WC   ondansetron  8 mg Oral q morning - 10a   pantoprazole  40 mg Oral Daily   psyllium  1 packet Oral QPM   simvastatin  10 mg Oral QPM   tamsulosin  0.4 mg Oral q AM   warfarin  4 mg Oral ONCE-1600   Warfarin - Pharmacist Dosing Inpatient   Does not apply q1600    Antimicrobials: Anti-infectives (From admission, onward)   Start     Dose/Rate Route Frequency Ordered Stop   06/09/20 2200  cephALEXin (KEFLEX) capsule 500 mg        500 mg Oral Every 8 hours 06/09/20 1732     06/05/20 0600  vancomycin (VANCOCIN) IVPB 1000 mg/200 mL premix  Status:  Discontinued        1,000 mg 200 mL/hr over 60 Minutes Intravenous Every 12 hours 06/04/20 1820 06/06/20 1148   06/05/20 0200  ceFEPIme (MAXIPIME) 2 g in sodium chloride 0.9 % 100 mL IVPB  Status:  Discontinued        2 g 200 mL/hr over 30 Minutes Intravenous Every 8 hours 06/04/20 1820 06/09/20 1732   06/05/20 0200  metroNIDAZOLE (FLAGYL) IVPB 500 mg  Status:  Discontinued        500 mg 100 mL/hr over 60 Minutes Intravenous Every 8 hours 06/04/20 2057 06/06/20 1148   06/04/20 1815  vancomycin (VANCOREADY) IVPB 1750 mg/350 mL        1,750 mg 175 mL/hr over 120 Minutes Intravenous  Once 06/04/20 1812 06/05/20 0119   06/04/20 1800  ceFEPIme (MAXIPIME) 2 g in sodium chloride 0.9 % 100 mL IVPB        2 g 200 mL/hr over 30 Minutes Intravenous  Once 06/04/20 1756 06/04/20 1945   06/04/20  1800  metroNIDAZOLE (FLAGYL) IVPB 500 mg        500 mg 100 mL/hr over 60 Minutes Intravenous  Once 06/04/20 1756 06/04/20 2117   06/04/20 1800  vancomycin (VANCOCIN) IVPB 1000 mg/200 mL premix  Status:  Discontinued        1,000 mg 200 mL/hr over 60 Minutes Intravenous  Once 06/04/20 1756 06/04/20 1812      PRN meds: acetaminophen **OR** acetaminophen, ALPRAZolam, baclofen, bisacodyl, docusate sodium, ondansetron **OR** ondansetron (ZOFRAN) IV, oxybutynin   Objective:  Vitals:   06/10/20 1216 06/10/20 1300  BP: (!) 98/49 (!) 103/57  Pulse: 74 89  Resp: 14 18  Temp: 98 F (36.7 C)   SpO2: 96% 97%    Intake/Output Summary (Last 24 hours) at 06/10/2020 1433 Last data filed at 06/10/2020 0550 Gross per 24 hour  Intake 100 ml  Output 4050 ml  Net -3950 ml   There were no vitals filed for this visit. Weight change:  There is no height or weight on file to calculate BMI.   Physical Exam: General exam: Appears calm and comfortable.  Not in physical distress. Skin: No rashes, lesions or ulcers. HEENT: Atraumatic, normocephalic, supple neck, no obvious bleeding Lungs: Clear to auscultation bilaterally CVS: Irregular rhythm.  Tachycardia improved. GI/Abd soft, nontender, nondistended, bowel sound present CNS: Alert, awake, oriented x3 Psychiatry: Mood appropriate Extremities: Improving pedal edema.  Mild right calf tenderness.  Data Review: I have personally reviewed the laboratory data and studies available.  Recent Labs  Lab 06/06/20 0132 06/07/20 0409 06/08/20 0003 06/09/20 0231 06/10/20 0116  WBC 8.1 6.9 6.3 5.8 6.7  NEUTROABS 5.2 4.1 3.3 2.8 3.1  HGB 11.1* 10.7* 11.3* 11.0* 11.1*  HCT 34.0* 33.6* 35.4* 34.2* 34.0*  MCV 96.3 98.5 97.5 99.1 97.4  PLT 158 174 186 214 220   Recent Labs  Lab 06/06/20 0132 06/07/20 0409 06/08/20 0003 06/09/20 0231 06/10/20 0116  NA 140 143 140 140 141  K 4.3 4.0 4.5 4.3 4.2  CL 103 105 98 97* 98  CO2 33* 29 34* 36* 35*    GLUCOSE 89 91 112* 90 82  BUN 13 14 14 16 14   CREATININE 0.45 0.48 0.54 0.59 0.40*  CALCIUM 8.1* 8.1* 8.3* 8.3* 8.5*    F/u labs ordered  Signed, Terrilee Croak, MD [facility] 06/10/2020

## 2020-06-10 NOTE — Plan of Care (Signed)

## 2020-06-11 ENCOUNTER — Encounter (HOSPITAL_COMMUNITY): Payer: Self-pay | Admitting: Internal Medicine

## 2020-06-11 DIAGNOSIS — J9601 Acute respiratory failure with hypoxia: Secondary | ICD-10-CM | POA: Diagnosis not present

## 2020-06-11 DIAGNOSIS — R6520 Severe sepsis without septic shock: Secondary | ICD-10-CM | POA: Diagnosis not present

## 2020-06-11 DIAGNOSIS — A419 Sepsis, unspecified organism: Secondary | ICD-10-CM | POA: Diagnosis not present

## 2020-06-11 LAB — CBC WITH DIFFERENTIAL/PLATELET
Abs Immature Granulocytes: 0.03 10*3/uL (ref 0.00–0.07)
Basophils Absolute: 0.1 10*3/uL (ref 0.0–0.1)
Basophils Relative: 1 %
Eosinophils Absolute: 0.4 10*3/uL (ref 0.0–0.5)
Eosinophils Relative: 6 %
HCT: 35.1 % — ABNORMAL LOW (ref 36.0–46.0)
Hemoglobin: 11.3 g/dL — ABNORMAL LOW (ref 12.0–15.0)
Immature Granulocytes: 0 %
Lymphocytes Relative: 30 %
Lymphs Abs: 2 10*3/uL (ref 0.7–4.0)
MCH: 31.6 pg (ref 26.0–34.0)
MCHC: 32.2 g/dL (ref 30.0–36.0)
MCV: 98 fL (ref 80.0–100.0)
Monocytes Absolute: 0.9 10*3/uL (ref 0.1–1.0)
Monocytes Relative: 13 %
Neutro Abs: 3.5 10*3/uL (ref 1.7–7.7)
Neutrophils Relative %: 50 %
Platelets: 256 10*3/uL (ref 150–400)
RBC: 3.58 MIL/uL — ABNORMAL LOW (ref 3.87–5.11)
RDW: 12.7 % (ref 11.5–15.5)
WBC: 6.9 10*3/uL (ref 4.0–10.5)
nRBC: 0 % (ref 0.0–0.2)

## 2020-06-11 LAB — PROTIME-INR
INR: 2.6 — ABNORMAL HIGH (ref 0.8–1.2)
Prothrombin Time: 26.9 seconds — ABNORMAL HIGH (ref 11.4–15.2)

## 2020-06-11 MED ORDER — DILTIAZEM HCL ER COATED BEADS 180 MG PO CP24
180.0000 mg | ORAL_CAPSULE | Freq: Every day | ORAL | Status: DC
  Administered 2020-06-11: 180 mg via ORAL
  Filled 2020-06-11: qty 1

## 2020-06-11 MED ORDER — SACCHAROMYCES BOULARDII 250 MG PO CAPS: 250.0000 mg | ORAL_CAPSULE | Freq: Two times a day (BID) | ORAL | 0 refills | Status: AC

## 2020-06-11 MED ORDER — CEPHALEXIN 500 MG PO CAPS
500.0000 mg | ORAL_CAPSULE | Freq: Three times a day (TID) | ORAL | 0 refills | Status: AC
Start: 2020-06-11 — End: 2020-06-18

## 2020-06-11 MED ORDER — WARFARIN SODIUM 2 MG PO TABS
4.0000 mg | ORAL_TABLET | Freq: Once | ORAL | Status: AC
  Administered 2020-06-11: 4 mg via ORAL
  Filled 2020-06-11: qty 2

## 2020-06-11 MED ORDER — DILTIAZEM HCL ER COATED BEADS 180 MG PO CP24: 180.0000 mg | ORAL_CAPSULE | Freq: Every day | ORAL | 0 refills | Status: DC

## 2020-06-11 NOTE — Telephone Encounter (Signed)
Noted.  Will close encounter.  

## 2020-06-11 NOTE — Progress Notes (Signed)
ANTICOAGULATION CONSULT NOTE - Consult  Pharmacy Consult for warfarin Indication: atrial fibrillation  Allergies  Allergen Reactions  . Chlorhexidine Hives and Other (See Comments)    And, sores appear where applied  . Hydrocodone Nausea And Vomiting and Other (See Comments)    Hydrocodone causes vomiting  . Oxycodone Nausea And Vomiting and Other (See Comments)    Oral oxycodone causes vomiting  . Zoloft [Sertraline] Hives, Swelling and Rash    Patient Measurements:    Vital Signs: Temp: 98.6 F (37 C) (10/22 0503) Temp Source: Oral (10/22 0503) BP: 105/58 (10/22 0503) Pulse Rate: 60 (10/22 0503)  Labs: Recent Labs    06/09/20 0231 06/09/20 0231 06/10/20 0116 06/11/20 0027  HGB 11.0*   < > 11.1* 11.3*  HCT 34.2*  --  34.0* 35.1*  PLT 214  --  220 256  LABPROT 26.2*  --  25.9* 26.9*  INR 2.5*  --  2.5* 2.6*  CREATININE 0.59  --  0.40*  --    < > = values in this interval not displayed.    Estimated Creatinine Clearance: 64.7 mL/min (A) (by C-G formula based on SCr of 0.4 mg/dL (L)).  Assessment: 40 YOF presenting with palpitations, history of Afib on anticoagulation with warfarin. Her last dose prior to admission was on 10/13. She did not receive warfarin on 10/14 or 10/15.   Today, INR 2.6 -therapeutic  PTA dosing: 5mg  daily except Monday/Friday 2.5mg   Goal of Therapy:  INR 2-3 Monitor platelets by anticoagulation protocol: Yes   Plan:  Warfarin 4mg  po x1 again tonight Monitor daily INR, CBC, clinical course, s/sx of bleed, drug interactions  Barth Kirks, PharmD, BCPS, BCCCP Clinical Pharmacist [phone]  Please check AMION for all [place] numbers  06/11/2020 8:41 AM

## 2020-06-11 NOTE — Progress Notes (Signed)
Progress Note  Patient Name: Toni [name] Date of Encounter: 06/11/2020  [facility] Cardiologist: Pixie Casino, MD   Subjective   Patient comfortable   No CP  Breathing is OK   Inpatient Medications    Scheduled Meds:  buPROPion  300 mg Oral Daily   cephALEXin  500 mg Oral Q8H   diltiazem  60 mg Oral TID   famotidine  20 mg Oral QHS   furosemide  20 mg Oral Daily   gabapentin  300 mg Oral Q6H   lactulose  10 g Oral Daily   levothyroxine  75 mcg Oral QAC breakfast   linaclotide  145 mcg Oral QAC breakfast   loratadine  10 mg Oral Daily   melatonin  5 mg Oral QHS   midodrine  5 mg Oral TID WC   ondansetron  8 mg Oral q morning - 10a   pantoprazole  40 mg Oral Daily   psyllium  1 packet Oral QPM   simvastatin  10 mg Oral QPM   tamsulosin  0.4 mg Oral q AM   Warfarin - Pharmacist Dosing Inpatient   Does not apply q1600   Continuous Infusions:  amiodarone 30 mg/hr (06/07/20 0247)   PRN Meds: acetaminophen **OR** acetaminophen, ALPRAZolam, baclofen, bisacodyl, docusate sodium, ondansetron **OR** ondansetron (ZOFRAN) IV, oxybutynin   Vital Signs    Vitals:   06/10/20 1915 06/10/20 2110 06/10/20 2314 06/11/20 0503  BP: (!) 103/58 114/64 (!) 107/49 (!) 105/58  Pulse: 99 90 61 60  Resp: 20 (!) 21 20 12   Temp: 98.2 F (36.8 C)  98.9 F (37.2 C) 98.6 F (37 C)  TempSrc:    Oral  SpO2: 96% 96% 98% 97%    Intake/Output Summary (Last 24 hours) at 06/11/2020 0656 Last data filed at 06/11/2020 0500 Gross per 24 hour  Intake --  Output 4300 ml  Net -4300 ml   Last 3 Weights 06/04/2020 04/08/2020 04/07/2020  Weight (lbs) 187 lb 6.3 oz 179 lb 10.8 oz 182 lb 15.7 oz  Weight (kg) 85 kg 81.5 kg 83 kg      Telemetry     Afib  80s/90s   - Personally Reviewed  ECG    None  Personally Reviewed  Physical Exam   GEN: Obese 74 yo  No acute distress.   Neck: No JVD Cardiac: Irreg irreg ,S1, S2  No murmurs  Respiratory: Clear to  auscultation bilaterally. GI: Soft, nontender, non-distended  MS: No edema;  Neuro:  Nonfocal  Psych: Normal affect   Labs    High Sensitivity Troponin:  No results for input(s): TROPONINIHS in the last 720 hours.    Chemistry Recent Labs  Lab 06/04/20 1911 06/04/20 1911 06/05/20 0035 06/06/20 0132 06/08/20 0003 06/09/20 0231 06/10/20 0116  NA 139   < > 139   < > 140 140 141  K 4.0   < > 3.7   < > 4.5 4.3 4.2  CL 100   < > 101   < > 98 97* 98  CO2 30   < > 29   < > 34* 36* 35*  GLUCOSE 79   < > 138*   < > 112* 90 82  BUN 13   < > 12   < > 14 16 14   CREATININE 0.39*   < > 0.48   < > 0.54 0.59 0.40*  CALCIUM 8.4*   < > 8.3*   < > 8.3* 8.3* 8.5*  PROT 5.1*  --  4.8*  --   --   --   --   ALBUMIN 2.7*  --  2.6*  --   --   --   --   AST 13*  --  15  --   --   --   --   ALT 15  --  15  --   --   --   --   ALKPHOS 42  --  37*  --   --   --   --   BILITOT 0.9  --  0.9  --   --   --   --   GFRNONAA >60   < > >60   < > >60 >60 >60  ANIONGAP 9   < > 9   < > 8 7 8    < > = values in this interval not displayed.     Hematology Recent Labs  Lab 06/09/20 0231 06/10/20 0116 06/11/20 0027  WBC 5.8 6.7 6.9  RBC 3.45* 3.49* 3.58*  HGB 11.0* 11.1* 11.3*  HCT 34.2* 34.0* 35.1*  MCV 99.1 97.4 98.0  MCH 31.9 31.8 31.6  MCHC 32.2 32.6 32.2  RDW 12.8 12.7 12.7  PLT 214 220 256    BNP Recent Labs  Lab 06/07/20 0409  BNP 119.7*     DDimer No results for input(s): DDIMER in the last 168 hours.   Radiology    DG CHEST PORT 1 VIEW  Result Date: 06/09/2020 CLINICAL DATA:  Dyspnea. EXAM: PORTABLE CHEST 1 VIEW COMPARISON:  06/05/2020 FINDINGS: Cardiac enlargement is unchanged. Mild vascular congestion without edema. Mild bibasilar airspace disease unchanged. No significant pleural effusion. IMPRESSION: Cardiac enlargement with vascular congestion.  Negative for edema Mild bibasilar atelectasis/infiltrate unchanged. Electronically Signed   By: Franchot Gallo M.D.   On: 06/09/2020  10:41   ECHOCARDIOGRAM COMPLETE  Result Date: 06/10/2020    ECHOCARDIOGRAM REPORT   Patient Name:   Toni [name] Waldren Date of Exam: 06/10/2020 Medical Rec #:  [id number]        Height:       63.0 in Accession #:    [id number]       Weight:       187.4 lb Date of Birth:  [birth year]         BSA:          1.881 m Patient Age:    74 years         BP:           108/46 mmHg Patient Gender: F                HR:           79 bpm. Exam Location:  Inpatient Procedure: 2D Echo, Cardiac Doppler and Color Doppler Indications:    Dyspnea 786.09 / R06.00  History:        Patient has prior history of Echocardiogram examinations, most                 recent 10/10/2019. CHF, Arrythmias:Atrial Fibrillation; Risk                 Factors:Hypertension, Diabetes, Dyslipidemia, Sleep Apnea and                 Former Smoker. GERD.  Sonographer:    Vickie Epley RDCS Referring Phys: [id number] [place]  1. Left ventricular ejection fraction, by estimation, is 55 to 60%. The left ventricle has normal function. The left ventricle has  no regional wall motion abnormalities. The left ventricular internal cavity size was mildly dilated. Left ventricular diastolic function could not be evaluated.  2. Right ventricular systolic function is normal. The right ventricular size is normal. There is normal pulmonary artery systolic pressure. The estimated right ventricular systolic pressure is 62.9 mmHg.  3. The mitral valve is normal in structure. Mild mitral valve regurgitation. No evidence of mitral stenosis.  4. The aortic valve is normal in structure. Aortic valve regurgitation is not visualized. No aortic stenosis is present.  5. The inferior vena cava is dilated in size with <50% respiratory variability, suggesting right atrial pressure of 15 mmHg. FINDINGS  Left Ventricle: Left ventricular ejection fraction, by estimation, is 55 to 60%. The left ventricle has normal function. The left ventricle has no regional wall motion  abnormalities. The left ventricular internal cavity size was mildly dilated. There is  no left ventricular hypertrophy. Left ventricular diastolic function could not be evaluated. Right Ventricle: The right ventricular size is normal. No increase in right ventricular wall thickness. Right ventricular systolic function is normal. There is normal pulmonary artery systolic pressure. The tricuspid regurgitant velocity is 1.35 m/s, and  with an assumed right atrial pressure of 15 mmHg, the estimated right ventricular systolic pressure is 47.6 mmHg. Left Atrium: Left atrial size was normal in size. Right Atrium: Right atrial size was normal in size. Pericardium: There is no evidence of pericardial effusion. Mitral Valve: The mitral valve is normal in structure. Mild mitral valve regurgitation. No evidence of mitral valve stenosis. Tricuspid Valve: The tricuspid valve is normal in structure. Tricuspid valve regurgitation is mild . No evidence of tricuspid stenosis. Aortic Valve: The aortic valve is normal in structure. Aortic valve regurgitation is not visualized. No aortic stenosis is present. Pulmonic Valve: The pulmonic valve was normal in structure. Pulmonic valve regurgitation is not visualized. No evidence of pulmonic stenosis. Aorta: The aortic root is normal in size and structure. Venous: The inferior vena cava is dilated in size with less than 50% respiratory variability, suggesting right atrial pressure of 15 mmHg. IAS/Shunts: No atrial level shunt detected by color flow Doppler.  LEFT VENTRICLE PLAX 2D LVIDd:         5.49 cm LVIDs:         3.79 cm LV PW:         0.76 cm LV IVS:        0.74 cm LVOT diam:     1.90 cm LV SV:         60 LV SV Index:   32 LVOT Area:     2.84 cm  LV Volumes (MOD) LV vol d, MOD A2C: 102.0 ml LV vol d, MOD A4C: 143.0 ml LV vol s, MOD A2C: 51.4 ml LV vol s, MOD A4C: 65.8 ml LV SV MOD A2C:     50.6 ml LV SV MOD A4C:     143.0 ml LV SV MOD BP:      64.6 ml RIGHT VENTRICLE TAPSE (M-mode):  1.9 cm LEFT ATRIUM           Index       RIGHT ATRIUM           Index LA diam:      3.60 cm 1.91 cm/m  RA Area:     16.20 cm LA Vol (A2C): 43.5 ml 23.13 ml/m RA Volume:   34.80 ml  18.50 ml/m LA Vol (A4C): 55.9 ml 29.72 ml/m  AORTIC VALVE LVOT Vmax:  112.00 cm/s LVOT Vmean:  71.300 cm/s LVOT VTI:    0.212 m  AORTA Ao Root diam: 2.50 cm TRICUSPID VALVE TR Peak grad:   7.3 mmHg TR Vmax:        135.00 cm/s  SHUNTS Systemic VTI:  0.21 m Systemic Diam: 1.90 cm Ena Dawley MD Electronically signed by Ena Dawley MD Signature Date/Time: 06/10/2020/3:12:02 PM    Final    VAS [name] LOWER EXTREMITY VENOUS (DVT)  Result Date: 06/10/2020  Lower Venous DVT Study Indications: Swelling.  Risk Factors: None identified. Comparison Study: No prior studies. Performing Technologist: Oliver Hum RVT  Examination Guidelines: A complete evaluation includes B-mode imaging, spectral Doppler, color Doppler, and power Doppler as needed of all accessible portions of each vessel. Bilateral testing is considered an integral part of a complete examination. Limited examinations for reoccurring indications may be performed as noted. The reflux portion of the exam is performed with the patient in reverse Trendelenburg.  +---------+---------------+---------+-----------+----------+--------------+  RIGHT     Compressibility Phasicity Spontaneity Properties Thrombus Aging  +---------+---------------+---------+-----------+----------+--------------+  CFV       Full            Yes       Yes                                    +---------+---------------+---------+-----------+----------+--------------+  SFJ       Full                                                             +---------+---------------+---------+-----------+----------+--------------+  FV Prox   Full                                                             +---------+---------------+---------+-----------+----------+--------------+  FV Mid    Full                                                              +---------+---------------+---------+-----------+----------+--------------+  FV Distal Full                                                             +---------+---------------+---------+-----------+----------+--------------+  PFV       Full                                                             +---------+---------------+---------+-----------+----------+--------------+  POP       Full  Yes       Yes                                    +---------+---------------+---------+-----------+----------+--------------+  PTV       Full                                                             +---------+---------------+---------+-----------+----------+--------------+  PERO      Full                                                             +---------+---------------+---------+-----------+----------+--------------+   +----+---------------+---------+-----------+----------+--------------+  LEFT Compressibility Phasicity Spontaneity Properties Thrombus Aging  +----+---------------+---------+-----------+----------+--------------+  CFV  Full            Yes       Yes                                    +----+---------------+---------+-----------+----------+--------------+     Summary: RIGHT: - There is no evidence of deep vein thrombosis in the lower extremity.  - No cystic structure found in the popliteal fossa.  LEFT: - No evidence of common femoral vein obstruction.  *See table(s) above for measurements and observations. Electronically signed by Servando Snare MD on 06/10/2020 at 4:10:43 PM.    Final     Cardiac Studies   Echo 10/21  1. Left ventricular ejection fraction, by estimation, is 55 to 60%. The left ventricle has normal function. The left ventricle has no regional wall motion abnormalities. The left ventricular internal cavity size was mildly dilated. Left ventricular diastolic function could not be evaluated. 2. Right ventricular systolic function is  normal. The right ventricular size is normal. There is normal pulmonary artery systolic pressure. The estimated right ventricular systolic pressure is 73.4 mmHg. 3. The mitral valve is normal in structure. Mild mitral valve regurgitation. No evidence of mitral stenosis. 4. The aortic valve is normal in structure. Aortic valve regurgitation is not visualized. No aortic stenosis is present. 5. The inferior vena cava is dilated in size with <50% respiratory variability, suggesting right atrial pressure of 15 mmHg.  Patient Profile     CHIOMA MUKHERJEE is a 74 y.o. female with a hx of MS, paroxysmal afib on coumadin, morbid obesity, varicose veins with venous insufficiency, OSA on Bipap, dyslipidemia who is being seen today for the evaluation of Afib RVR at the request of Dr. Pietro Cassis.  Assessment & Plan    1  Atrial fibrllation  Rates are pretty good   Lower last night  She is not too mobile   For now I would recomm Dilt CD 180 mg   Has prn dilt to take as well  Short acting   2  Hypotension   For now I would keep her on 5 midodrine tid   Follow as outpt  3  Diastolic CHF   Volume status is not bad    Keep on current regimen  WIll make sure she has outpt f/u    OK to d/c  For questions or updates, please contact [place] Please consult [email or web address] for contact info under        Signed, Dorris Carnes, MD  06/11/2020, 6:56 AM

## 2020-06-11 NOTE — Discharge Summary (Signed)
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 female with PMH of multiple sclerosis, bedbound status, A. fib on Coumadin, sleep apnea on nocturnal BiPAP, frequent UTIs who had Covid in January 2021 and has been physically weak since then. Patient was brought from home to the ED on 06/04/2020 with complaint of palpitation, fever and progressive symptoms.  EMS noted a temperature of 100, her oxygen saturation in 70s. In the ED, she had a fever of 101.3, initial BP of 87/58 which improved after bolus of IV fluid. WBC count was 10.4, INR 2.7 Chest x-ray negative. CT abdomen pelvis showed constipation and a known small renal cell carcinoma. Covid PCR negative.  Influenza swab negative. She was started on sepsis pathway with empiric antibiotics, IV fluids and admitted for further evaluation and management.  Subjective:  Seen and examined this morning.  Pleasant.  Lying in bed.  Not in distress.   A. fib controlled in 90s. Daughter at bedside. Patient wants to go home.  Hospital Course:  Sepsis -  POA Recurrent urinary tract infection -Patient presented with fever, tachycardia, hypotension, history of recurrent UTI on suppressive Macrobid. -Chest x-ray normal. -Sepsis parameters improved with IV fluid, IV antibiotics. -Urine culture showed 70,000 CFU per mL of multiple species.    Patient history of recurrent UTI and needs chronically suppressed on antibiotics.  Despite that, patient had UTI symptoms and presented with sepsis.  Although urine culture did not show significant growth, she was treated as a true case of UTI.  She improved with IV antibiotics.  We will discharge her home on oral Keflex for next 7 days.  She has been chronically taking oral Macrobid under the care of urologist Dr. Matilde Sprang at [facility] urology.  Patient will follow up with him as an outpatient for any need change in chronic antibiotics.   -Blood culture negative.  Acute on chronic respiratory failure with hypoxia Central sleep apnea -No evidence of infection on chest x-ray.   -Echocardiogram February 2021 with normal EF, normal LV function and no wall motion abnormality.  Obtain repeat echocardiogram. -Per patient's daughter, she has central sleep apnea which probably was responsible for her low saturation of 70% on EMS evaluation. -Initially required supplemental oxygen.  Weaned off from oxygen after diuresis.   -She is currently continued on oral Lasix.  Currently off oxygen in the day.   -Continue trilogy nightly at home.  History of hypotension Orthostatic hypotension -Reports hypotension since diagnosis of Covid last year -Received adequate IV hydration. -Continue home midodrine  Acute metabolic encephalopathy -Resolved.  Mental status back to baseline now.  Permanent A. Fib with RVR -In the setting of sepsis, mild volume overload and  respiratory failure, patient heart rate was running elevated.   -Prior to admission, patient was on Cardizem 240 mg daily.   -Cardiology consult obtained.   Cardizem dose was adjusted..  Per cardiology recommendation, will discharge her home on Cardizem CD 180 mg daily.  She is also instructed to take additional short-acting Cardizem if needed. -Continue Coumadin to target INR 2-3. -Echocardiogram with EF 55 to 60%, no wall motion abnormality  Chronic constipation Continue bowel regimen  Multiple sclerosis Continue home management  Right calf tenderness -DVT ruled out.  Okay to discharge home today with home health.  Wound care:    Discharge Exam:   Vitals:   06/10/20 2314 06/11/20 0503 06/11/20 0729 06/11/20 0924  BP: (!) 107/49 (!) 105/58 (!) 116/49 114/65  Pulse: 61 60 84   Resp: 20 12 14    Temp: 98.9 F (37.2 C) 98.6 F (37 C) 98.9 F (37.2 C)   TempSrc:  Oral Oral   SpO2: 98% 97% 97%     There is no height or weight on file to calculate BMI.  General exam: Appears calm and comfortable.  Not in physical distress Skin: No rashes, lesions or ulcers. HEENT: Atraumatic, normocephalic, supple neck, no obvious bleeding Lungs: Clear to auscultation bilaterally CVS: Irregular rate controlled, no murmur GI/Abd soft, nontender, nondistended, bowel sound present CNS: Alert, awake, oriented x3 Psychiatry: Mood appropriate Extremities: Chronic bilateral trace pedal edema, no calf tenderness  Follow ups:   Discharge Instructions    Diet - low sodium heart healthy   Complete by: As directed    Increase activity slowly   Complete by: As directed    Increase activity slowly   Complete by: As directed       Follow-up Information    Avva, Ravisankar, MD Follow up.   Specialty: Internal Medicine Why: Dr Danna Hefty office will call you after discharge to schedule your next appt. Contact information: [address] [place] [address] [place], [facility] Follow up.   Specialty: Home Health Services Why: Phone: [phone].  [facility] will call you for your next visit within 24-48  hours.       Deberah Pelton, NP Follow up.   Specialty: Cardiology Why: [place] location - a follow-up has been arranged for you on Thursday Jul 01, 2020 at 9:15 AM (Arrive by 9:00 AM). Denyse Amass is one of our nurse practitioners that works closely with Dr. Debara Pickett. Contact information: [address] [place] [address] [phone]        Pixie Casino, MD Follow up.   Specialty: Cardiology Contact information: [place] [address] [phone]               Recommendations for Outpatient Follow-Up:   1. Follow-up with PCP as an outpatient 2. Follow-up with cardiology as an outpatient 3. Follow-up with urology as an outpatient  Discharge Instructions:  Follow with Primary MD Avva, Ravisankar, MD in 7 days   Get CBC/BMP checked in next visit within 1 week by PCP or SNF MD ( we routinely change or add medications that can affect your baseline labs and fluid status, therefore we recommend that you get the mentioned basic workup next visit with your PCP, your PCP may decide not to get them or add new tests based on their clinical decision)  On your next visit with your PCP, please Get Medicines reviewed and adjusted.  Please request  your PCP  to go over all Hospital Tests and Procedure/Radiological results at the follow up, please get all Hospital records sent to your Prim MD by signing hospital release before you go home.  Activity: As tolerated with Full fall precautions use walker/cane & assistance as needed  For Heart failure patients - Check your Weight same time everyday, if you gain over 2 pounds, or you develop in leg swelling, experience more shortness of breath or chest pain, call your Primary MD immediately. Follow Cardiac Low Salt Diet and 1.5 lit/day fluid restriction.  If you have smoked or chewed Tobacco in the last 2 yrs please stop smoking, stop any regular Alcohol  and or any Recreational drug  use.  If you experience worsening of your admission symptoms, develop shortness of breath, life threatening emergency, suicidal or homicidal thoughts you must seek medical attention immediately by calling 911 or calling your MD immediately  if symptoms less severe.  You Must read complete instructions/literature along with all the possible adverse reactions/side effects for all the Medicines you take and that have been prescribed to you. Take any new Medicines after you have completely understood and accpet all the possible adverse reactions/side effects.   Do not drive, operate heavy machinery, perform activities at heights, swimming or participation in water activities or provide baby sitting services if your were admitted for syncope or siezures until you have seen by Primary MD or a Neurologist and advised to do so again.  Do not drive when taking Pain medications.  Do not take more than prescribed Pain, Sleep and Anxiety Medications  Wear Seat belts while driving.   Please note You were cared for by a hospitalist during your hospital stay. If you have any questions about your discharge medications or the care you received while you were in the hospital after you are discharged, you can call the unit and asked to speak with the hospitalist on call if the hospitalist that took care of you is not available. Once you are discharged, your primary care physician will handle any further medical issues. Please note that NO REFILLS for any discharge medications will be authorized once you are discharged, as it is imperative that you return to your primary care physician (or establish a relationship with a primary care physician if you do not have one) for your aftercare needs so that they can reassess your need for medications and monitor your lab values.    Allergies as of 06/11/2020      Reactions   Chlorhexidine Hives, Other (See Comments)   And, sores appear where applied   Hydrocodone Nausea  And Vomiting, Other (See Comments)   Hydrocodone causes vomiting   Oxycodone Nausea And Vomiting, Other (See Comments)   Oral oxycodone causes vomiting   Zoloft [sertraline] Hives, Swelling, Rash      Medication List    STOP taking these medications   nitrofurantoin 100 MG capsule Commonly known as: MACRODANTIN     TAKE these medications   acetaminophen 500 MG tablet Commonly known as: TYLENOL Take 500-1,000 mg by mouth every 6 (six) hours as needed for fever or headache (or pain).   Acthar 80 UNIT/ML injectable gel Generic drug: corticotropin Inject 1 mL (80 Units total) into the muscle every other day. For 10 days each month. What changed:   how to take this  when to take this  additional instructions   ALPRAZolam 0.5 MG tablet Commonly known as: XANAX Take 1 tablet (0.5 mg total) by  mouth 3 (three) times daily as needed for anxiety or sleep.   baclofen 20 MG tablet Commonly known as: LIORESAL Take 1 tablet (20 mg total) by mouth 2 (two) times daily as needed (if the pump needs a boost). What changed: reasons to take this   BIOTIN PO Take 1 tablet by mouth daily.   buPROPion 300 MG 24 hr tablet Commonly known as: Wellbutrin XL Take 1 tablet (300 mg total) by mouth daily.   CALCIUM + D3 PO Take 2 tablets by mouth in the morning and at bedtime.   cephALEXin 500 MG capsule Commonly known as: KEFLEX Take 1 capsule (500 mg total) by mouth every 8 (eight) hours for 7 days.   cetirizine 10 MG tablet Commonly known as: ZYRTEC Take 10 mg by mouth daily as needed for allergies.   CO Q-10 PO Take 1 capsule by mouth daily.   diltiazem 180 MG 24 hr capsule Commonly known as: CARDIZEM CD Take 1 capsule (180 mg total) by mouth daily. Start taking on: June 12, 2020 What changed:   medication strength  how much to take   diltiazem 60 MG tablet Commonly known as: CARDIZEM Take 1 tablet (60 mg total) by mouth every 6 (six) hours as needed for up to 7 days (HR  sustained >110bpm).   docusate sodium 100 MG capsule Commonly known as: COLACE Take 1 capsule (100 mg total) by mouth daily. What changed:   how much to take  when to take this  reasons to take this   famotidine 20 MG tablet Commonly known as: PEPCID Take 20 mg by mouth at bedtime.   FISH OIL PO Take 1 capsule by mouth daily.   furosemide 20 MG tablet Commonly known as: LASIX Take 1 tablet by mouth daily, may take second dose if needed for excess edema What changed:   how much to take  how to take this  when to take this  additional instructions   gabapentin 300 MG capsule Commonly known as: NEURONTIN TAKE 1 CAPSULE BY MOUTH UP  TO 4 TIMES DAILY What changed: See the new instructions.   lactulose (encephalopathy) 10 GM/15ML Soln Commonly known as: Generlac TAKE 15 ML BY MOUTH TWICE DAILY AS NEEDED What changed:   how much to take  how to take this  when to take this  additional instructions   levothyroxine 75 MCG tablet Commonly known as: SYNTHROID Take 75 mcg by mouth daily before breakfast.   linaclotide 145 MCG Caps capsule Commonly known as: LINZESS Take 1 capsule (145 mcg total) by mouth daily before breakfast.   melatonin 5 MG Tabs Take 5 mg by mouth at bedtime.   midodrine 5 MG tablet Commonly known as: PROAMATINE Take 1 tablet (5 mg total) by mouth 3 (three) times daily with meals. What changed:   when to take this  reasons to take this   omeprazole 20 MG capsule Commonly known as: PRILOSEC Take 20 mg by mouth daily before breakfast.   ondansetron 8 MG tablet Commonly known as: ZOFRAN Take 8 mg by mouth every morning.   oxybutynin 5 MG tablet Commonly known as: DITROPAN Take 5 mg by mouth 3 (three) times daily as needed for bladder spasms.   psyllium 28 % packet Commonly known as: METAMUCIL SMOOTH TEXTURE Take 1 packet by mouth every evening.   saccharomyces boulardii 250 MG capsule Commonly known as: FLORASTOR Take 1  capsule (250 mg total) by mouth 2 (two) times daily for 7 days.  simvastatin 10 MG tablet Commonly known as: ZOCOR Take 10 mg by mouth every evening.   SYRINGE/NEEDLE (DISP) 1 ML 25G X 5/8" 1 ML Misc Commonly known as: BD Luer-Lok Syringe Use as directed to inject  Acthar   tamsulosin 0.4 MG Caps capsule Commonly known as: FLOMAX Take 0.4 mg by mouth in the morning.   Vitamin D3 50 MCG (2000 UT) Tabs Take 2,000 Units by mouth in the morning and at bedtime.   warfarin 5 MG tablet Commonly known as: COUMADIN Take as directed. If you are unsure how to take this medication, talk to your nurse or doctor. Original instructions: Take 1-1.5 tablets (5-7.5 mg total) by mouth See admin instructions. Take 5 mg by mouth in the late evening on Sun/Tues/Wed/Thurs/Fri/Sat and 7.5 mg on Mondays What changed:   how much to take  additional instructions            Durable Medical Equipment  (From admission, onward)         Start     Ordered   06/10/20 1253  For home use only DME standard manual wheelchair with seat cushion  Once       Comments: Patient suffers from multiple sclerosis, impaired mobility, bed and wheelchair bound status which impairs their ability to perform daily activities like bathing, dressing, feeding, grooming, and toileting in the home.  A cane, crutch, or walker will not resolve issue with performing activities of daily living. A wheelchair will allow patient to safely perform daily activities. Patient can safely propel the wheelchair in the home or has a caregiver who can provide assistance. Length of need Lifetime. Accessories: elevating leg rests (ELRs), wheel locks, extensions and anti-tippers. Wheelchair with reclining back   06/10/20 1253          Time coordinating discharge: 35 minutes  The results of significant diagnostics from this hospitalization (including imaging, microbiology, ancillary and laboratory) are listed below for reference.     Procedures and Diagnostic Studies:   CT ABDOMEN PELVIS W CONTRAST  Result Date: 06/04/2020 CLINICAL DATA:  Fever.  UTI.  Generalized abdominal pain. EXAM: CT ABDOMEN AND PELVIS WITH CONTRAST TECHNIQUE: Multidetector CT imaging of the abdomen and pelvis was performed using the standard protocol following bolus administration of intravenous contrast. CONTRAST:  181mL OMNIPAQUE IOHEXOL 300 MG/ML  SOLN COMPARISON:  04/06/2020 FINDINGS: Lower chest: The heart is enlarged. Dependent atelectasis noted bilaterally, right greater than left. Hepatobiliary: No suspicious focal abnormality within the liver parenchyma. There is no evidence for gallstones, gallbladder wall thickening, or pericholecystic fluid. No intrahepatic or extrahepatic biliary dilation. Pancreas: No focal mass lesion. No dilatation of the main duct. No intraparenchymal cyst. No peripancreatic edema. Spleen: No splenomegaly. No focal mass lesion. Adrenals/Urinary Tract: No adrenal nodule or mass. 17 mm lesion upper pole left kidney shows heterogeneous enhancement, as before with no substantial change since 02/25/2019. Tiny hypodensities in the left kidney are similar to prior and remain too small to characterize but likely benign. Mild fullness noted both intrarenal collecting systems without associated hydroureter. Bladder is distended with slight circumferential bladder wall prominence. Stomach/Bowel: Gastric wall thickening likely related to underdistention. No gastric wall thickening. No evidence of outlet obstruction. Duodenum is normally positioned as is the ligament of Treitz. No small bowel wall thickening. No small bowel dilatation. The terminal ileum is normal. The appendix is normal. Large colonic stool volume. Vascular/Lymphatic: No abdominal aortic aneurysm. No abdominal lymphadenopathy. No pelvic sidewall lymphadenopathy. Reproductive: IUD is visualized in the uterus. There  is no adnexal mass. Other: No intraperitoneal free fluid.  Musculoskeletal: Mild body wall edema evident. Bones are demineralized. Right-sided spinal device again noted, incompletely visualized. IMPRESSION: 1. No acute findings in the abdomen or pelvis. No findings to explain the patient's history of generalized abdominal pain. 2. Similar appearance heterogeneously enhancing 17 mm lesion upper pole left kidney. Renal cell carcinoma a concern. 3. Prominent colonic stool volume. Imaging features could be compatible with clinical constipation. 4. Prominent gastric wall likely related to underdistention. Electronically Signed   By: Misty Stanley M.D.   On: 06/04/2020 06:33   DG CHEST PORT 1 VIEW  Result Date: 06/05/2020 CLINICAL DATA:  Respiratory failure/hypoxia EXAM: PORTABLE CHEST 1 VIEW COMPARISON:  June 04, 2020 FINDINGS: There is cardiomegaly with pulmonary vascularity within normal limits. There is mild right base atelectasis. Lungs elsewhere clear. No adenopathy. Postoperative change noted in the lower cervical spine. IMPRESSION: Cardiomegaly. Mild right base atelectasis. No edema or airspace opacity. Electronically Signed   By: Lowella Grip III M.D.   On: 06/05/2020 09:36   DG Chest Port 1 View  Result Date: 06/04/2020 CLINICAL DATA:  74 year old female with shortness of breath and fever. EXAM: PORTABLE CHEST 1 VIEW COMPARISON:  Chest CT dated 07/08/2017. chest radiograph dated 06/04/2020. FINDINGS: There are bibasilar interstitial coarsening or atelectasis. No focal consolidation, pleural effusion or pneumothorax. Stable cardiomegaly. Atherosclerotic calcification of the aorta. No acute osseous pathology. Cervical ACDF. IMPRESSION: No active disease. Electronically Signed   By: Anner Crete M.D.   On: 06/04/2020 20:01   DG Chest Port 1 View  Result Date: 06/04/2020 CLINICAL DATA:  Sepsis EXAM: PORTABLE CHEST 1 VIEW COMPARISON:  10/14/2019 FINDINGS: Lungs are well expanded, symmetric, and clear. No pneumothorax or pleural effusion. Cardiac  size within normal limits. Pulmonary vascularity is normal. Osseous structures are age-appropriate. No acute bone abnormality. IMPRESSION: No active disease. Electronically Signed   By: Fidela Salisbury MD   On: 06/04/2020 01:46     Labs:   Basic Metabolic Panel: Recent Labs  Lab 06/06/20 0132 06/06/20 0132 06/07/20 6283 06/07/20 0409 06/08/20 0003 06/08/20 0003 06/09/20 0231 06/10/20 0116  NA 140  --  143  --  140  --  140 141  K 4.3   < > 4.0   < > 4.5   < > 4.3 4.2  CL 103  --  105  --  98  --  97* 98  CO2 33*  --  29  --  34*  --  36* 35*  GLUCOSE 89  --  91  --  112*  --  90 82  BUN 13  --  14  --  14  --  16 14  CREATININE 0.45  --  0.48  --  0.54  --  0.59 0.40*  CALCIUM 8.1*  --  8.1*  --  8.3*  --  8.3* 8.5*   < > = values in this interval not displayed.   GFR Estimated Creatinine Clearance: 64.7 mL/min (A) (by C-G formula based on SCr of 0.4 mg/dL (L)). Liver Function Tests: Recent Labs  Lab 06/04/20 1911 06/05/20 0035  AST 13* 15  ALT 15 15  ALKPHOS 42 37*  BILITOT 0.9 0.9  PROT 5.1* 4.8*  ALBUMIN 2.7* 2.6*   No results for input(s): LIPASE, AMYLASE in the last 168 hours. Recent Labs  Lab 06/04/20 1911  AMMONIA 20   Coagulation profile Recent Labs  Lab 06/07/20 0409 06/08/20 0003 06/09/20 0231 06/10/20 0116 06/11/20  1610  INR 1.9* 2.0* 2.5* 2.5* 2.6*    CBC: Recent Labs  Lab 06/07/20 0409 06/08/20 0003 06/09/20 0231 06/10/20 0116 06/11/20 0027  WBC 6.9 6.3 5.8 6.7 6.9  NEUTROABS 4.1 3.3 2.8 3.1 3.5  HGB 10.7* 11.3* 11.0* 11.1* 11.3*  HCT 33.6* 35.4* 34.2* 34.0* 35.1*  MCV 98.5 97.5 99.1 97.4 98.0  PLT 174 186 214 220 256   Cardiac Enzymes: No results for input(s): CKTOTAL, CKMB, CKMBINDEX, TROPONINI in the last 168 hours. BNP: Invalid input(s): POCBNP CBG: No results for input(s): GLUCAP in the last 168 hours. D-Dimer No results for input(s): DDIMER in the last 72 hours. Hgb A1c No results for input(s): HGBA1C in the last 72  hours. Lipid Profile No results for input(s): CHOL, HDL, LDLCALC, TRIG, CHOLHDL, LDLDIRECT in the last 72 hours. Thyroid function studies No results for input(s): TSH, T4TOTAL, T3FREE, THYROIDAB in the last 72 hours.  Invalid input(s): FREET3 Anemia work up No results for input(s): VITAMINB12, FOLATE, FERRITIN, TIBC, IRON, RETICCTPCT in the last 72 hours. Microbiology Recent Results (from the past 240 hour(s))  Blood Culture (routine x 2)     Status: None   Collection Time: 06/04/20 12:50 AM   Specimen: BLOOD  Result Value Ref Range Status   Specimen Description BLOOD LEFT ANTECUBITAL  Final   Special Requests   Final    BOTTLES DRAWN AEROBIC AND ANAEROBIC Blood Culture adequate volume   Culture   Final    NO GROWTH 5 DAYS Performed at [facility], [address], [place], [place] [address]    Report Status 06/09/2020 FINAL  Final  Respiratory Panel by RT PCR (Flu A&B, Covid) - Nasopharyngeal Swab     Status: None   Collection Time: 06/04/20 12:52 AM   Specimen: Nasopharyngeal Swab  Result Value Ref Range Status   SARS Coronavirus 2 by RT PCR NEGATIVE NEGATIVE Final    Comment: (NOTE) SARS-CoV-2 target nucleic acids are NOT DETECTED.  The SARS-CoV-2 RNA is generally detectable in upper respiratoy specimens during the acute phase of infection. The lowest concentration of SARS-CoV-2 viral copies this assay can detect is 131 copies/mL. A negative result does not preclude SARS-Cov-2 infection and should not be used as the sole basis for treatment or other patient management decisions. A negative result may occur with  improper specimen collection/handling, submission of specimen other than nasopharyngeal swab, presence of viral mutation(s) within the areas targeted by this assay, and inadequate number of viral copies (<131 copies/mL). A negative result must be combined with clinical observations, patient history, and epidemiological information. The expected result is  Negative.  Fact Sheet for Patients:  PinkCheek.be  Fact Sheet for Healthcare Providers:  GravelBags.it  This test is no t yet approved or cleared by the [place] FDA and  has been authorized for detection and/or diagnosis of SARS-CoV-2 by FDA under an Emergency Use Authorization (EUA). This EUA will remain  in effect (meaning this test can be used) for the duration of the COVID-19 declaration under Section 564(b)(1) of the Act, 21 U.S.C. section 360bbb-3(b)(1), unless the authorization is terminated or revoked sooner.     Influenza A by PCR NEGATIVE NEGATIVE Final   Influenza B by PCR NEGATIVE NEGATIVE Final    Comment: (NOTE) The Xpert Xpress SARS-CoV-2/FLU/RSV assay is intended as an aid in  the diagnosis of influenza from Nasopharyngeal swab specimens and  should not be used as a sole basis for treatment. Nasal washings and  aspirates are unacceptable  for Xpert Xpress SARS-CoV-2/FLU/RSV  testing.  Fact Sheet for Patients: PinkCheek.be  Fact Sheet for Healthcare Providers: GravelBags.it  This test is not yet approved or cleared by the [place] FDA and  has been authorized for detection and/or diagnosis of SARS-CoV-2 by  FDA under an Emergency Use Authorization (EUA). This EUA will remain  in effect (meaning this test can be used) for the duration of the  Covid-19 declaration under Section 564(b)(1) of the Act, 21  U.S.C. section 360bbb-3(b)(1), unless the authorization is  terminated or revoked. Performed at [facility], [place] [address] [place], [place] [address]   Blood Culture (routine x 2)     Status: None   Collection Time: 06/04/20 12:54 AM   Specimen: BLOOD RIGHT HAND  Result Value Ref Range Status   Specimen Description BLOOD RIGHT HAND  Final   Special Requests   Final    BOTTLES DRAWN AEROBIC AND ANAEROBIC Blood Culture results  may not be optimal due to an inadequate volume of blood received in culture bottles   Culture   Final    NO GROWTH 5 DAYS Performed at [facility], [place] [address] [place], [place] [address]    Report Status 06/09/2020 FINAL  Final  Urine culture     Status: Abnormal   Collection Time: 06/04/20  3:13 AM   Specimen: In/Out Cath Urine  Result Value Ref Range Status   Specimen Description IN/OUT CATH URINE  Final   Special Requests   Final    NONE Performed at [facility], [place] [address], [place], [place] [address]    Culture (A)  Final    70,000 COLONIES/mL MULTIPLE SPECIES PRESENT, SUGGEST RECOLLECTION   Report Status 06/05/2020 FINAL  Final  MRSA PCR Screening     Status: None   Collection Time: 06/04/20 11:53 PM   Specimen: Nasopharyngeal  Result Value Ref Range Status   MRSA by PCR NEGATIVE NEGATIVE Final    Comment:        The GeneXpert MRSA Assay (FDA approved for NASAL specimens only), is one component of a comprehensive MRSA colonization surveillance program. It is not intended to diagnose MRSA infection nor to guide or monitor treatment for MRSA infections. Performed at [facility], [place] [address], [place], [place] [address]      Signed: Terrilee Croak  [facility] 06/11/2020, 11:39 AM

## 2020-06-11 NOTE — Discharge Instructions (Signed)
Sepsis, Diagnosis, Adult Sepsis is a serious bodily reaction to an infection. The infection that triggers sepsis may be from a bacteria, virus, or fungus. Sepsis can result from an infection in any part of your body. Infections that commonly lead to sepsis include skin, lung, and urinary tract infections. Sepsis is a medical emergency that must be treated right away in a hospital. In severe cases, it can lead to septic shock. Septic shock can weaken your heart and cause your blood pressure to drop. This can cause your central nervous system and your body's organs to stop working. What are the causes? This condition is caused by a severe reaction to infections from bacteria, viruses, or fungus. The germs that most often lead to sepsis include:  Escherichia coli (E. coli) bacteria.  Staphylococcus aureus (staph) bacteria.  Some types of Streptococcus bacteria. The most common infections affect these organs:  The lung (pneumonia).  The kidneys or bladder (urinary tract infection).  The skin (cellulitis).  The bowel, gallbladder, or pancreas. What increases the risk? You are more likely to develop this condition if:  Your body's disease-fighting system (immune system) is weakened.  You are age 65 or older.  You are female.  You had surgery or you have been hospitalized.  You have these devices inserted into your body: ? A small, thin tube (catheter). ? IV line. ? Breathing tube. ? Drainage tube.  You are not getting enough nutrients from food (malnourished).  You have a long-term (chronic) disease, such as cancer, lung disease, kidney disease, or diabetes.  You are African American. What are the signs or symptoms? Symptoms of this condition may include:  Fever.  Chills or feeling very cold.  Confusion or anxiety.  Fatigue.  Muscle aches.  Shortness of breath.  Nausea and vomiting.  Urinating much less than usual.  Fast heart rate (tachycardia).  Rapid  breathing (hyperventilation).  Changes in skin color. Your skin may look blotchy, pale, or blue.  Cool, clammy, or sweaty skin.  Skin rash. Other symptoms depend on the source of your infection. How is this diagnosed? This condition is diagnosed based on:  Your symptoms.  Your medical history.  A physical exam. Other tests may also be done to find out the cause of the infection and how severe the sepsis is. These tests may include:  Blood tests.  Urine tests.  Swabs from other areas of your body that may have an infection. These samples may be tested (cultured) to find out what type of bacteria is causing the infection.  Chest X-ray to check for pneumonia. Other imaging tests, such as a CT scan, may also be done.  Lumbar puncture. This removes a small amount of the fluid that surrounds your brain and spinal cord. The fluid is then examined for infection. How is this treated? This condition must be treated in a hospital. Based on the cause of your infection, you may be given an antibiotic, antiviral, or antifungal medicine. You may also receive:  Fluids through an IV.  Oxygen and breathing assistance.  Medicines to increase your blood pressure.  Kidney dialysis. This process cleans your blood if your kidneys have failed.  Surgery to remove infected tissue.  Blood transfusion if needed.  Medicine to prevent blood clots.  Nutrients to correct imbalances in basic body function (metabolism). You may: ? Receive important salts and minerals (electrolytes) through an IV. ? Have your blood sugar level adjusted. Follow these instructions at home: Medicines   Take over-the-counter and   prescription medicines only as told by your health care provider.  If you were prescribed an antibiotic, antiviral, or antifungal medicine, take it as told by your health care provider. Do not stop taking the medicine even if you start to feel better. General instructions  If you have a  catheter or other indwelling device, ask to have it removed as soon as possible.  Keep all follow-up visits as told by your health care provider. This is important. Contact a health care provider if:  You do not feel like you are getting better or regaining strength.  You are having trouble coping with your recovery.  You frequently feel tired.  You feel worse or do not seem to get better after surgery.  You think you may have an infection after surgery. Get help right away if:  You have any symptoms of sepsis.  You have difficulty breathing.  You have a rapid or skipping heartbeat.  You become confused or disoriented.  You have a high fever.  Your skin becomes blotchy, pale, or blue.  You have an infection that is getting worse or not getting better. These symptoms may represent a serious problem that is an emergency. Do not wait to see if the symptoms will go away. Get medical help right away. Call your local emergency services (911 in the [place] Do not drive yourself to the hospital. Summary  Sepsis is a medical emergency that requires immediate treatment in a hospital.  This condition is caused by a severe reaction to infections from bacteria, viruses, or fungus.  Based on the cause of your infection, you may be given an antibiotic, antiviral, or antifungal medicine.  Treatment may also include IV fluids, breathing assistance, and kidney dialysis. This information is not intended to replace advice given to you by your health care provider. Make sure you discuss any questions you have with your health care provider. Document Revised: 03/15/2018 Document Reviewed: 03/15/2018 Elsevier Patient Education  2020 Elsevier Inc.  

## 2020-06-11 NOTE — Telephone Encounter (Signed)
This encounter was created in error - please disregard.

## 2020-06-11 NOTE — TOC Transition Note (Signed)
Transition of Care [facility]) - CM/SW Discharge Note   Patient Details  Name: Toni [name] MRN: [id number] Date of Birth: [birth year]  Transition of Care [facility]) CM/SW Contact:  Joanne Chars, LCSW Phone Number: 06/11/2020, 1:29 PM   Clinical Narrative:   Pt discharging home with [facility].  [employer] provided wheelchair, which was wrong size.  [employer] came and picked it back up and family will pick up the correct one at the store.  [facility] will transport.  No other needs identified.     Final next level of care: [place] Barriers to Discharge: Barriers Resolved   Patient Goals and CMS Choice Patient states their goals for this hospitalization and ongoing recovery are:: better mobility CMS Medicare.gov Compare Post Acute Care list provided to::  ([facility] already in place and pt would like to stay with them)    Discharge Placement                       Discharge Plan and Services     Post Acute Care Choice: Home Health          DME Arranged: Wheelchair manual DME Agency: [employer] Date DME Agency Contacted: 06/11/20 Time DME Agency Contacted: 1000 Representative spoke with at DME Agency: Adela Lank HH Arranged: PT, OT Home Agency: [place] ([place]) Date [place]: 06/07/20 Time [place]: 4 Representative spoke with at [place]: [place] ([place]) Interventions     Readmission Risk Interventions Readmission Risk Prevention Plan 06/11/2020  Transportation Screening Complete  PCP or Specialist appointment within 3-5 days of discharge Not Complete  PCP/Specialist Appt Not Complete comments MD office reports they have to call the pt and schedule this appt themselves when they receive notice of [place] or [place] Complete  SW Recovery Care/Counseling Consult Complete  [place] Not Applicable  Some recent data might be  hidden

## 2020-06-11 NOTE — Progress Notes (Signed)
Physical Therapy Treatment Patient Details Name: Toni [name] MRN: [id number] DOB: [birth year] Today's Date: 06/11/2020    History of Present Illness Pt is a 74 y.o. female with PMH of persistent A. fib, hypertension, HLD, DVT, MS, OSA, COVID (07/2019) presents to ED 06/04/20 for acute onset, persistent and progressively worsening abdominal pain and distention. Workup for Afib with RVR, UTI, sepsis, acute respiratory failure.    PT Comments    Pt supine on arrival willing to attempt standing as this is a goal she wants to return to. With elevated bed and use of stedy pt able to stand total of 6 trials today with assist. Daughter present throughout session. Pt very aware of sequencing and assist needed and she does well to direct movement as needed.   SpO2 93% on RA with HR 110-130 Afib.  BP EOB 94/70 (79) and sitting after standing trials 108/70 (77)   Follow Up Recommendations  Home health PT;Supervision/Assistance - 24 hour     Equipment Recommendations  None recommended by PT    Recommendations for Other Services       Precautions / Restrictions Precautions Precautions: Fall;Other (comment) Precaution Comments: Watch BP and SPO2    Mobility  Bed Mobility Overal bed mobility: Needs Assistance Bed Mobility: Supine to Sit     Supine to sit: Min assist;HOB elevated Sit to supine: Mod assist   General bed mobility comments: min assist to fully lift trunk from surface with HOB 20 degrees and use of rail as well as clear legs off surface. return to bed with pt controlling trunk and mod assist to lift legs. Max assist to slide toward [facility]  Transfers Overall transfer level: Needs assistance   Transfers: Sit to/from Stand Sit to Stand: From elevated surface;Min assist;Mod assist         General transfer comment: pt stood x 6 trials from elevated bed to stedy and within stedy. With rocking and momentum to rise and assist at sacrum to fully clear. Pt transferred in stedy  to sink to brush her hair but declined recliner due to awaiting D/C  Ambulation/Gait             General Gait Details: unable   Stairs             Wheelchair Mobility    Modified Rankin (Stroke Patients Only)       Balance Overall balance assessment: Needs assistance Sitting-balance support: No upper extremity supported;Feet supported Sitting balance-Leahy Scale: Fair Sitting balance - Comments: minguard after achieving sitting for safety   Standing balance support: Bilateral upper extremity supported Standing balance-Leahy Scale: Poor Standing balance comment: bil Ue support on stedy                            Cognition Arousal/Alertness: Awake/alert Behavior During Therapy: WFL for tasks assessed/performed Overall Cognitive Status: Within Functional Limits for tasks assessed                                        Exercises      General Comments        Pertinent Vitals/Pain Pain Assessment: No/denies pain    Home Living                      Prior Function  PT Goals (current goals can now be found in the care plan section) Progress towards PT goals: Progressing toward goals    Frequency    Min 3X/week      PT Plan Current plan remains appropriate    Co-evaluation              AM-PAC PT "6 Clicks" Mobility   Outcome Measure  Help needed turning from your back to your side while in a flat bed without using bedrails?: A Lot Help needed moving from lying on your back to sitting on the side of a flat bed without using bedrails?: A Lot Help needed moving to and from a bed to a chair (including a wheelchair)?: A Lot Help needed standing up from a chair using your arms (e.g., wheelchair or bedside chair)?: A Lot Help needed to walk in hospital room?: Total Help needed climbing 3-5 steps with a railing? : Total 6 Click Score: 10    End of Session   Activity Tolerance: Patient tolerated  treatment well Patient left: in bed;with call bell/phone within reach;with family/visitor present Nurse Communication: Mobility status;Need for lift equipment PT Visit Diagnosis: Other abnormalities of gait and mobility (R26.89);Muscle weakness (generalized) (M62.81)     Time: 6195-0932 PT Time Calculation (min) (ACUTE ONLY): 26 min  Charges:  $Therapeutic Activity: 23-37 mins                     Marrah Vanevery P, PT Acute Rehabilitation Services Pager: [phone] Office: [place] E[place] Laforte 06/11/2020, 11:58 AM

## 2020-06-11 NOTE — Progress Notes (Signed)
Dr. Harrington Challenger requested for follow-up to be arranged for this pt in 2-3 weeks. Have arranged on Thursday Jul 01, 2020 9:15 AM with APP. Also appears pt is on Coumadin with INR followed by home health nursing and sent to our office. When patient is medically ready for discharge would recommend primary team confer with pharmD when to check INR next and relay to home health team. Please call with questions. Chalmer Zheng PA-C

## 2020-06-13 DIAGNOSIS — G35 Multiple sclerosis: Secondary | ICD-10-CM | POA: Diagnosis not present

## 2020-06-13 DIAGNOSIS — J45909 Unspecified asthma, uncomplicated: Secondary | ICD-10-CM | POA: Diagnosis not present

## 2020-06-13 DIAGNOSIS — J9621 Acute and chronic respiratory failure with hypoxia: Secondary | ICD-10-CM | POA: Diagnosis not present

## 2020-06-13 DIAGNOSIS — C649 Malignant neoplasm of unspecified kidney, except renal pelvis: Secondary | ICD-10-CM | POA: Diagnosis not present

## 2020-06-13 DIAGNOSIS — Z6832 Body mass index (BMI) 32.0-32.9, adult: Secondary | ICD-10-CM | POA: Diagnosis not present

## 2020-06-13 DIAGNOSIS — I509 Heart failure, unspecified: Secondary | ICD-10-CM | POA: Diagnosis not present

## 2020-06-13 DIAGNOSIS — E669 Obesity, unspecified: Secondary | ICD-10-CM | POA: Diagnosis not present

## 2020-06-13 DIAGNOSIS — F419 Anxiety disorder, unspecified: Secondary | ICD-10-CM | POA: Diagnosis not present

## 2020-06-13 DIAGNOSIS — G4733 Obstructive sleep apnea (adult) (pediatric): Secondary | ICD-10-CM | POA: Diagnosis not present

## 2020-06-13 DIAGNOSIS — K5904 Chronic idiopathic constipation: Secondary | ICD-10-CM | POA: Diagnosis not present

## 2020-06-13 DIAGNOSIS — I951 Orthostatic hypotension: Secondary | ICD-10-CM | POA: Diagnosis not present

## 2020-06-13 DIAGNOSIS — A419 Sepsis, unspecified organism: Secondary | ICD-10-CM | POA: Diagnosis not present

## 2020-06-13 DIAGNOSIS — E119 Type 2 diabetes mellitus without complications: Secondary | ICD-10-CM | POA: Diagnosis not present

## 2020-06-13 DIAGNOSIS — I110 Hypertensive heart disease with heart failure: Secondary | ICD-10-CM | POA: Diagnosis not present

## 2020-06-13 DIAGNOSIS — K219 Gastro-esophageal reflux disease without esophagitis: Secondary | ICD-10-CM | POA: Diagnosis not present

## 2020-06-13 DIAGNOSIS — I872 Venous insufficiency (chronic) (peripheral): Secondary | ICD-10-CM | POA: Diagnosis not present

## 2020-06-13 DIAGNOSIS — I8390 Asymptomatic varicose veins of unspecified lower extremity: Secondary | ICD-10-CM | POA: Diagnosis not present

## 2020-06-13 DIAGNOSIS — N390 Urinary tract infection, site not specified: Secondary | ICD-10-CM | POA: Diagnosis not present

## 2020-06-13 DIAGNOSIS — I480 Paroxysmal atrial fibrillation: Secondary | ICD-10-CM | POA: Diagnosis not present

## 2020-06-13 DIAGNOSIS — M170 Bilateral primary osteoarthritis of knee: Secondary | ICD-10-CM | POA: Diagnosis not present

## 2020-06-13 DIAGNOSIS — F32A Depression, unspecified: Secondary | ICD-10-CM | POA: Diagnosis not present

## 2020-06-13 DIAGNOSIS — I4821 Permanent atrial fibrillation: Secondary | ICD-10-CM | POA: Diagnosis not present

## 2020-06-13 DIAGNOSIS — E785 Hyperlipidemia, unspecified: Secondary | ICD-10-CM | POA: Diagnosis not present

## 2020-06-13 DIAGNOSIS — N39490 Overflow incontinence: Secondary | ICD-10-CM | POA: Diagnosis not present

## 2020-06-13 DIAGNOSIS — E039 Hypothyroidism, unspecified: Secondary | ICD-10-CM | POA: Diagnosis not present

## 2020-06-14 ENCOUNTER — Telehealth: Payer: Self-pay | Admitting: Internal Medicine

## 2020-06-14 NOTE — Telephone Encounter (Signed)
Spoke to patient she stated her pulse has been elevated ranging 134,120,116,presently 118 B/P 116/72.Stated she feels jittery.Stated she just took long acting Diltiazem 180 mg.She will wait about 1 more hour and if heart rate 110 or more she will take short acting Diltiazem.She will try taking 30 mg and if heart rate still over 110 she will take another 30 mg.Post hospital appointment scheduled with Coletta Memos NP 10/29 at 3:15 pm.Advised to call back sooner if needed. Advised I will make Dr.Hilty aware.

## 2020-06-14 NOTE — Telephone Encounter (Signed)
Pt c/o BP issue: STAT if pt c/o blurred vision, one-sided weakness or slurred speech  1. What are your last 5 BP readings? 116/72 pulse 123  2. Are you having any other symptoms (ex. Dizziness, headache, blurred vision, passed out)? Patient is has the shakes   3. What is your BP issue? Pulse high also patient just got out the hospital on Friday.

## 2020-06-15 ENCOUNTER — Encounter: Payer: Self-pay | Admitting: Neurology

## 2020-06-15 ENCOUNTER — Ambulatory Visit (INDEPENDENT_AMBULATORY_CARE_PROVIDER_SITE_OTHER): Payer: Medicare Other | Admitting: Pharmacist

## 2020-06-15 ENCOUNTER — Other Ambulatory Visit: Payer: Self-pay

## 2020-06-15 ENCOUNTER — Ambulatory Visit (INDEPENDENT_AMBULATORY_CARE_PROVIDER_SITE_OTHER): Payer: Medicare Other | Admitting: Neurology

## 2020-06-15 VITALS — BP 107/65 | HR 79 | Ht 62.0 in

## 2020-06-15 DIAGNOSIS — Z7901 Long term (current) use of anticoagulants: Secondary | ICD-10-CM | POA: Diagnosis not present

## 2020-06-15 DIAGNOSIS — J9621 Acute and chronic respiratory failure with hypoxia: Secondary | ICD-10-CM | POA: Diagnosis not present

## 2020-06-15 DIAGNOSIS — I110 Hypertensive heart disease with heart failure: Secondary | ICD-10-CM | POA: Diagnosis not present

## 2020-06-15 DIAGNOSIS — G35 Multiple sclerosis: Secondary | ICD-10-CM

## 2020-06-15 DIAGNOSIS — I509 Heart failure, unspecified: Secondary | ICD-10-CM | POA: Diagnosis not present

## 2020-06-15 DIAGNOSIS — A419 Sepsis, unspecified organism: Secondary | ICD-10-CM | POA: Diagnosis not present

## 2020-06-15 DIAGNOSIS — I4891 Unspecified atrial fibrillation: Secondary | ICD-10-CM | POA: Diagnosis not present

## 2020-06-15 DIAGNOSIS — N390 Urinary tract infection, site not specified: Secondary | ICD-10-CM | POA: Diagnosis not present

## 2020-06-15 LAB — POCT INR: INR: 2.2 (ref 2.0–3.0)

## 2020-06-15 MED ORDER — BACLOFEN 40 MG/20ML IT SOLN
80.0000 mg | Freq: Once | INTRATHECAL | Status: AC
  Administered 2020-06-15: 80 mg via INTRATHECAL

## 2020-06-15 NOTE — Procedures (Signed)
     History:  Toni [name] is a 74 year old patient with a history of multiple sclerosis with paraparesis.  She has baclofen pump in place and comes in for another refill.  She missed her alarm date as she was in the hospital with a UTI.  Her arm has been on and off for the last several days.  No significant change in spasticity has been noted.  Baclofen pump refill note  The baclofen pump site was cleaned with Betadine solution. A 21-gauge needle was inserted into the pump port site. Approximately 6 cc of residual baclofen was removed the pump indicates that he has 0 cc residual. 40 cc of replacement baclofen was placed into the pump at 2000 mcg/cc concentration.  The pump was reprogrammed for the following settings: The pump was maintained at simple continuous rate with 849.4 mcg/day.  The alarm volume is set at 1.5 cc. The next alarm date is 09/13/2020.  The patient tolerated the procedure well. There were no complications of the above procedure.  The [place] number is [phone] The baclofen expiration date is June 2023. The baclofen lot number is [id number].

## 2020-06-16 ENCOUNTER — Telehealth: Payer: Self-pay | Admitting: Pulmonary Disease

## 2020-06-16 DIAGNOSIS — N390 Urinary tract infection, site not specified: Secondary | ICD-10-CM | POA: Diagnosis not present

## 2020-06-16 DIAGNOSIS — I509 Heart failure, unspecified: Secondary | ICD-10-CM | POA: Diagnosis not present

## 2020-06-16 DIAGNOSIS — I110 Hypertensive heart disease with heart failure: Secondary | ICD-10-CM | POA: Diagnosis not present

## 2020-06-16 DIAGNOSIS — J9621 Acute and chronic respiratory failure with hypoxia: Secondary | ICD-10-CM | POA: Diagnosis not present

## 2020-06-16 DIAGNOSIS — G35 Multiple sclerosis: Secondary | ICD-10-CM | POA: Diagnosis not present

## 2020-06-16 DIAGNOSIS — A419 Sepsis, unspecified organism: Secondary | ICD-10-CM | POA: Diagnosis not present

## 2020-06-16 NOTE — Telephone Encounter (Signed)
Spoke with pt. She is aware of Dr. Judson Roch response.

## 2020-06-16 NOTE — Telephone Encounter (Signed)
Spoke with pt who stated that she wanted to let Dr. Ander Slade know she completed her HST last night and she was recently in hospital and required 02 while admitted.  She wanted a televist set up with Dr. Burnett Harry to review results of HST. Pt was advised that it would be a least 2 week before results would be available.  Dr. Ander Slade please advise on pt having a televisit vs an OV. Pt states she is having trouble getting out of house related to [place] Thank you

## 2020-06-16 NOTE — Telephone Encounter (Signed)
She will be updated as soon as I review the results, should be able to review study as I am in the office November 1,2  Video visit can be with me or with one of the APPs

## 2020-06-17 DIAGNOSIS — J9621 Acute and chronic respiratory failure with hypoxia: Secondary | ICD-10-CM | POA: Diagnosis not present

## 2020-06-17 DIAGNOSIS — N390 Urinary tract infection, site not specified: Secondary | ICD-10-CM | POA: Diagnosis not present

## 2020-06-17 DIAGNOSIS — I509 Heart failure, unspecified: Secondary | ICD-10-CM | POA: Diagnosis not present

## 2020-06-17 DIAGNOSIS — G35 Multiple sclerosis: Secondary | ICD-10-CM | POA: Diagnosis not present

## 2020-06-17 DIAGNOSIS — A419 Sepsis, unspecified organism: Secondary | ICD-10-CM | POA: Diagnosis not present

## 2020-06-17 DIAGNOSIS — I110 Hypertensive heart disease with heart failure: Secondary | ICD-10-CM | POA: Diagnosis not present

## 2020-06-17 NOTE — Progress Notes (Signed)
Cardiology Clinic Note   Patient Name: Toni [name] Date of Encounter: 06/18/2020  Primary Care Provider:  Prince Solian, MD Primary Cardiologist:  Pixie Casino, MD  Patient Profile    Toni [name] 74 year old female presents the clinic today for follow-up evaluation of her atrial fibrillation.  Past Medical History    Past Medical History:  Diagnosis Date  . Abnormality of gait 03/01/2015  . Anxiety   . Arthritis    "knees" (01/19/2016)  . Asthma    as a child   . Back pain   . Cancer ([place])    / RENAL CELL CARCINOMA - GETS CT SCAN EVERY 6 MONTHS TO WATCH   . CHF (congestive heart failure) ([place])   . Chronic pain    "nerve pain from the MS"  . Complex atypical endometrial hyperplasia   . Constipation   . Depression   . Diabetes mellitus without complication ([place])   . DVT (deep venous thrombosis) ([place]) 1983   "RLE; may have just been phlebitis; it was before the age of dopplers"  . Dysrhythmia    afib   . 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 GERD (gastroesophageal reflux disease)   . HLD (hyperlipidemia)   . Hypertension   . Hypotension   . Hypothyroidism   . Joint pain   . Multiple sclerosis ([place])    has had this may 1989-[facility] reg doc  . OSA on CPAP    bipap machine - SLEEP APNEA WORSE SINCE COVID IN 1/21 PER PATIENT SETTING HAS INCREASED FROM 9 TO 30   . Osteoarthritis   . PAF (paroxysmal atrial fibrillation) ([place])    on coumadin; documented on monitor 06/2010  . Pneumonia 11/2011  . Sleep apnea    Past Surgical History:  Procedure Laterality Date  . ANTERIOR CERVICAL DECOMP/DISCECTOMY FUSION  01/2004  . CARDIAC CATHETERIZATION  06/22/2010   normal coronary arteries, PAF  . CATARACT EXTRACTION, BILATERAL Bilateral   . CHILD BIRTHS     X2  . DILATATION & CURETTAGE/HYSTEROSCOPY WITH MYOSURE N/A 01/15/2020   Procedure: DILATATION & CURETTAGE/HYSTEROSCOPY WITH MYOSURE;  Surgeon: Arvella Nigh, MD;  Location: [facility];  Service: Gynecology;  Laterality: N/A;  pt in wheelchair-has MS  . DILATION AND CURETTAGE OF UTERUS    . DILATION AND CURETTAGE OF UTERUS N/A 02/16/2020   Procedure: DILATATION AND CURETTAGE;  Surgeon: Lafonda Mosses, MD;  Location: [facility];  Service: Gynecology;  Laterality: N/A;  NOT GENERAL -NO INTUBATION  . INFUSION PUMP IMPLANTATION  ~ 2009   baclofen infusion in lower abd  . INTRAUTERINE DEVICE (IUD) INSERTION N/A 02/16/2020   Procedure: INTRAUTERINE DEVICE (IUD) INSERTION - MIRENA;  Surgeon: Lafonda Mosses, MD;  Location: [facility];  Service: Gynecology;  Laterality: N/A;  . PAIN PUMP REVISION N/A 07/29/2014   Procedure: Baclofen pump replacement;  Surgeon: Erline Levine, MD;  Location: [facility];  Service: Neurosurgery;  Laterality: N/A;  Baclofen pump replacement  . TONSILLECTOMY AND ADENOIDECTOMY    . VARICOSE VEIN SURGERY  ~ 1968  . VENOUS ABLATION  12/16/2010   radiofreq ablation -Dr Elisabeth Cara and [facility]  . WISDOM TOOTH EXTRACTION      Allergies  Allergies  Allergen Reactions  . Chlorhexidine Hives and Other (See Comments)    And, sores appear where applied  . Hydrocodone Nausea And Vomiting and Other (See Comments)    Hydrocodone  causes vomiting  . Oxycodone Nausea And Vomiting and Other (See Comments)    Oral oxycodone causes vomiting  . Zoloft [Sertraline] Hives, Swelling and Rash    History of Present Illness    Toni [name] has a past medical history of essential hypertension, permanent atrial fibrillation, OSA, CHF, nonischemic cardiomyopathy type 2 diabetes, HLD, COVID-19 infection, multiple sclerosis, and depression.  She was recently admitted to the hospital 06/04/2020 until 06/11/2020 with diagnosis of sepsis due to recurrent UTI.  She was also noted to have atrial fibrillation, OSA, type 2 diabetes and received treatment for acute metabolic encephalopathy.  She is bedbound and has had frequent UTIs.  She contracted COVID-19 infection  1/21 and has been physically weak since that time.  When she presented to the emergency department 06/04/2020 chief complaint of palpitations, fever, and progressive symptoms.  Temperature via EMS was 100 and her oxygen saturation was in the 70s.  On arrival to the emergency department her temperature was 101.3 and her initial blood pressure was 80s over 50s which improved with an IV fluid bolus.  Chest x-ray was negative abdominal CT showed constipation and a known small renal cell carcinoma.  Covid PCR was negative.  She was started on sepsis protocol with empiric antibiotics, IV fluids and admitted for further evaluation and treatment.  She improved with IV antibiotics and it was felt that she may need chronic antibiotics for future prevention.  Blood cultures were negative.  In the setting of sepsis and fluid volume overload her heart rate has been elevated.  Prior to admission she was on Cardizem 240 mg daily.  She was discharged home on Cardizem CD 180 mg daily and instructed to take an additional short acting Cardizem as needed.  Her warfarin target remained with an INR 2-3.  Echocardiogram showed an EF of 55-60% no wall motion abnormalities.  She presents to the clinic today for follow-up evaluation and states she is feeling much better since being discharged from the hospital.  She does state that her blood pressures have been increasing that her heart rates have stayed in the 90s/100s.  She has been monitoring her blood pressure and heart rates daily and brings her log today.  She states that since having Covid she is returning to normal.  She feels that her Covid symptoms were not typical.  She describes increasing trouble with sleep apnea, fatigue, and weakness.  She is not able to wear self daily due to being wheelchair-bound/mass.  I will increase her Cardizem to 240 mg.  I will give her the salty 6 diet sheet, have her increase her physical activity as tolerated and follow-up with Dr. Debara Pickett in 3  months.  EKG today shows atrial fibrillation 97 bpm  Today she denies chest pain, shortness of breath, lower extremity edema, fatigue, palpitations, melena, hematuria, hemoptysis, diaphoresis, weakness, presyncope, syncope, orthopnea, and PND.     Home Medications    Prior to Admission medications   Medication Sig Start Date End Date Taking? Authorizing Provider  acetaminophen (TYLENOL) 500 MG tablet Take 500-1,000 mg by mouth every 6 (six) hours as needed for fever or headache (or pain).    [provider]  ALPRAZolam Duanne Moron) 0.5 MG tablet Take 1 tablet (0.5 mg total) by mouth 3 (three) times daily as needed for anxiety or sleep. 02/26/20   Dohmeier, Asencion Partridge, MD  baclofen (LIORESAL) 20 MG tablet Take 1 tablet (20 mg total) by mouth 2 (two) times daily as needed (if the pump needs a  boost). Patient taking differently: Take 20 mg by mouth 2 (two) times daily as needed for muscle spasms.  04/15/20   Dohmeier, Asencion Partridge, MD  BIOTIN PO Take 1 tablet by mouth daily.    [provider]  buPROPion (WELLBUTRIN XL) 300 MG 24 hr tablet Take 1 tablet (300 mg total) by mouth daily. 08/23/17   Dennard Nip D, MD  Calcium Carb-Cholecalciferol (CALCIUM + D3 PO) Take 2 tablets by mouth in the morning and at bedtime.    [provider]  cephALEXin (KEFLEX) 500 MG capsule Take 1 capsule (500 mg total) by mouth every 8 (eight) hours for 7 days. 06/11/20 06/18/20  Terrilee Croak, MD  cetirizine (ZYRTEC) 10 MG tablet Take 10 mg by mouth daily as needed for allergies.     [provider]  Cholecalciferol (VITAMIN D3) 50 MCG (2000 UT) TABS Take 2,000 Units by mouth in the morning and at bedtime.    [provider]  Coenzyme Q10 (CO Q-10 PO) Take 1 capsule by mouth daily.     [provider]  corticotropin (ACTHAR) 80 UNIT/ML injectable gel Inject 1 mL (80 Units total) into the muscle every other day. For 10 days each month. Patient taking differently: Inject 80 Units  into the skin See admin instructions. Inject 80 units into the skin as directed every other day for a period of 10 days each month 12/01/19   Dohmeier, Asencion Partridge, MD  diltiazem (CARDIZEM CD) 180 MG 24 hr capsule Take 1 capsule (180 mg total) by mouth daily. 06/12/20 07/12/20  Terrilee Croak, MD  diltiazem (CARDIZEM) 60 MG tablet Take 1 tablet (60 mg total) by mouth every 6 (six) hours as needed for up to 7 days (HR sustained >110bpm). Patient not taking: Reported on 06/04/2020 10/17/19 04/07/20  Terrilee Croak, MD  docusate sodium (COLACE) 100 MG capsule Take 1 capsule (100 mg total) by mouth daily. Patient taking differently: Take 200 mg by mouth daily as needed for mild constipation.  08/12/16   Rai, Vernelle Emerald, MD  famotidine (PEPCID) 20 MG tablet Take 20 mg by mouth at bedtime.  06/11/18   [provider]  furosemide (LASIX) 20 MG tablet Take 1 tablet by mouth daily, may take second dose if needed for excess edema Patient taking differently: Take 20 mg by mouth in the morning and at bedtime.  09/30/19   Hilty, Nadean Corwin, MD  gabapentin (NEURONTIN) 300 MG capsule TAKE 1 CAPSULE BY MOUTH UP  TO 4 TIMES DAILY Patient taking differently: Take 300 mg by mouth every 6 (six) hours.  03/02/20   Dohmeier, Asencion Partridge, MD  lactulose, encephalopathy, (GENERLAC) 10 GM/15ML SOLN TAKE 15 ML BY MOUTH TWICE DAILY AS NEEDED Patient taking differently: Take 10 g by mouth See admin instructions. Take 10 grams by mouth once a day and an additional 10 grams once daily, as needed to move the bowels 03/25/20   Dohmeier, Asencion Partridge, MD  levothyroxine (SYNTHROID, LEVOTHROID) 75 MCG tablet Take 75 mcg by mouth daily before breakfast.     [provider]  linaclotide (LINZESS) 145 MCG CAPS capsule Take 1 capsule (145 mcg total) by mouth daily before breakfast. 04/20/20   Thornton Park, MD  melatonin 5 MG TABS Take 5 mg by mouth at bedtime.     [provider]  midodrine (PROAMATINE) 5 MG tablet Take 1 tablet (5 mg  total) by mouth 3 (three) times daily with meals. Patient taking differently: Take 5 mg by mouth 2 (two) times daily as  needed (for blood pressure).  09/30/19   Hilty, Nadean Corwin, MD  Omega-3 Fatty Acids (FISH OIL PO) Take 1 capsule by mouth daily.    [provider]  omeprazole (PRILOSEC) 20 MG capsule Take 20 mg by mouth daily before breakfast.     [provider]  ondansetron (ZOFRAN) 8 MG tablet Take 8 mg by mouth every morning.  01/12/20   [provider]  oxybutynin (DITROPAN) 5 MG tablet Take 5 mg by mouth 3 (three) times daily as needed for bladder spasms.  02/09/20   [provider]  psyllium (METAMUCIL SMOOTH TEXTURE) 28 % packet Take 1 packet by mouth every evening.     [provider]  saccharomyces boulardii (FLORASTOR) 250 MG capsule Take 1 capsule (250 mg total) by mouth 2 (two) times daily for 7 days. 06/11/20 06/18/20  Terrilee Croak, MD  simvastatin (ZOCOR) 10 MG tablet Take 10 mg by mouth every evening.  03/01/18   [provider]  SYRINGE/NEEDLE, DISP, 1 ML (BD LUER-LOK SYRINGE) 25G X 5/8" 1 ML MISC Use as directed to inject  Acthar 06/15/15   Dohmeier, Asencion Partridge, MD  Tamsulosin HCl (FLOMAX) 0.4 MG CAPS Take 0.4 mg by mouth in the morning.     [provider]  warfarin (COUMADIN) 5 MG tablet Take 1-1.5 tablets (5-7.5 mg total) by mouth See admin instructions. Take 5 mg by mouth in the late evening on Sun/Tues/Wed/Thurs/Fri/Sat and 7.5 mg on Mondays Patient taking differently: Take 2.5-5 mg by mouth See admin instructions. Take 1 tablet (5 mg totally) by mouth in the late evening on Sun/Tues/Wed/Thurs/Sat. Except 0.5 tablet ( 2.5 mg totally) by mouth on Monday, Friday 04/09/20   Mariel Aloe, MD    Family History    Family History  Problem Relation Age of Onset  . Coronary artery disease Father        at age 51  . Hyperlipidemia Father   . Thyroid disease Father   . Coronary artery disease Maternal Grandmother   .  Depression Maternal Grandmother   . Cancer Paternal Grandmother        breast  . Depression Mother   . Sudden death Mother   . Anxiety disorder Mother   . Alcoholism Son    She indicated that her mother is deceased. She indicated that her father is deceased. She indicated that her maternal grandmother is deceased. She indicated that her paternal grandmother is deceased. She indicated that the status of her son is unknown.  Social History    Social History   Socioeconomic History  . Marital status: Married    Spouse name: [name]  . Number of children: 2  . Years of education: COLLEGE  . Highest education level: Not on file  Occupational History  . Occupation: RETIRED  Tobacco Use  . Smoking status: Former Smoker    Packs/day: 0.50    Years: 7.00    Pack years: 3.50    Types: Cigarettes    Quit date: 08/21/1976    Years since quitting: 43.8  . Smokeless tobacco: Never Used  Vaping Use  . Vaping Use: Never used  Substance and Sexual Activity  . Alcohol use: Not Currently  . Drug use: No  . Sexual activity: Not Currently  Other Topics Concern  . Not on file  Social History Narrative   Lives in [place] with Husband [name] [phone], [phone]   Right handed   Drinks no caffeine   Social Determinants of Health  Financial Resource Strain:   . Difficulty of Paying Living Expenses: Not on file  Food Insecurity:   . Worried About Charity fundraiser in the Last Year: Not on file  . Ran Out of Food in the Last Year: Not on file  Transportation Needs:   . Lack of Transportation (Medical): Not on file  . Lack of Transportation (Non-Medical): Not on file  Physical Activity:   . Days of Exercise per Week: Not on file  . Minutes of Exercise per Session: Not on file  Stress:   . Feeling of Stress : Not on file  Social Connections:   . Frequency of Communication with Friends and Family: Not on file  . Frequency of Social Gatherings with Friends and Family: Not on file  . Attends  Religious Services: Not on file  . Active Member of Clubs or Organizations: Not on file  . Attends Archivist Meetings: Not on file  . Marital Status: Not on file  Intimate Partner Violence:   . Fear of Current or Ex-Partner: Not on file  . Emotionally Abused: Not on file  . Physically Abused: Not on file  . Sexually Abused: Not on file     Review of Systems    General:  No chills, fever, night sweats or weight changes.  Cardiovascular:  No chest pain, dyspnea on exertion, edema, orthopnea, palpitations, paroxysmal nocturnal dyspnea. Dermatological: No rash, lesions/masses Respiratory: No cough, dyspnea Urologic: No hematuria, dysuria Abdominal:   No nausea, vomiting, diarrhea, bright red blood per rectum, melena, or hematemesis Neurologic:  No visual changes, wkns, changes in mental status. All other systems reviewed and are otherwise negative except as noted above.  Physical Exam    VS:  BP (!) 120/58   Pulse 97   Ht 5\' 2"  (1.575 m)   Wt 178 lb (80.7 kg)   BMI 32.56 kg/m  , BMI Body mass index is 32.56 kg/m. GEN: Well nourished, well developed, in no acute distress. HEENT: normal. Neck: Supple, no JVD, carotid bruits, or masses. Cardiac: RRR, no murmurs, rubs, or gallops. No clubbing, cyanosis, edema.  Radials/DP/PT 2+ and equal bilaterally.  Respiratory:  Respirations regular and unlabored, clear to auscultation bilaterally. GI: Soft, nontender, nondistended, BS + x 4. MS: no deformity or atrophy. Skin: warm and dry, no rash. Neuro:  Strength and sensation are intact. Psych: Normal affect.  Accessory Clinical Findings    Recent Labs: 10/10/2019: TSH 1.374 10/15/2019: Magnesium 2.0 06/05/2020: ALT 15 06/07/2020: B Natriuretic Peptide 119.7 06/10/2020: BUN 14; Creatinine, Ser 0.40; Potassium 4.2; Sodium 141 06/11/2020: Hemoglobin 11.3; Platelets 256   Recent Lipid Panel    Component Value Date/Time   CHOL 111 03/19/2019 1517   TRIG 53 03/19/2019 1517     HDL 57 03/19/2019 1517   CHOLHDL 3.0 04/22/2010 0536   VLDL 29 04/22/2010 0536   LDLCALC 43 03/19/2019 1517    ECG personally reviewed by me today-atrial fibrillation low voltage QRS anterior infarct undetermined age 46 bpm  Echocardiogram 10/21/ 2021 IMPRESSIONS    1. Left ventricular ejection fraction, by estimation, is 55 to 60%. The  left ventricle has normal function. The left ventricle has no regional  wall motion abnormalities. The left ventricular internal cavity size was  mildly dilated. Left ventricular  diastolic function could not be evaluated.  2. Right ventricular systolic function is normal. The right ventricular  size is normal. There is normal pulmonary artery systolic pressure. The  estimated right ventricular systolic pressure is  22.3 mmHg.  3. The mitral valve is normal in structure. Mild mitral valve  regurgitation. No evidence of mitral stenosis.  4. The aortic valve is normal in structure. Aortic valve regurgitation is  not visualized. No aortic stenosis is present.  5. The inferior vena cava is dilated in size with <50% respiratory  variability, suggesting right atrial pressure of 15 mmHg.  Assessment & Plan   1.  Permanent atrial fibrillation-EKG today shows atrial fibrillation level which QRS anterior infarct undetermined age 4 bpm.  Cardiac unaware.  No increased DOE remains wheelchair bound and is working with physical therapy. Continue Cardizem, Coumadin May take short acting diltiazem for accelerated heart rate. Heart healthy low-sodium diet-salty 6 given Increase physical activity as tolerated Avoid triggers caffeine, chocolate, EtOH etc.  Hypotension-BP today 120/58.  Has been well controlled at home. Continue midodrine 3 times daily Heart healthy diet Increase physical activity as tolerated  Hyperlipidemia-LDL 43 on 03/19/2019 Continue simvastatin Heart healthy high-fiber diet Increase physical activity as tolerated  Disposition:  Follow-up with Dr. Debara Pickett in 3 months.  Jossie Ng. Xandria Gallaga NP-C    06/18/2020, 3:45 PM [facility] [place] [address] Office [phone] Fax [phone]  Notice: This dictation was prepared with Dragon dictation along with smaller phrase technology. Any transcriptional errors that result from this process are unintentional and may not be corrected upon review.

## 2020-06-18 ENCOUNTER — Ambulatory Visit (INDEPENDENT_AMBULATORY_CARE_PROVIDER_SITE_OTHER): Payer: Medicare Other | Admitting: General Practice

## 2020-06-18 ENCOUNTER — Encounter: Payer: Self-pay | Admitting: General Practice

## 2020-06-18 ENCOUNTER — Other Ambulatory Visit: Payer: Self-pay

## 2020-06-18 ENCOUNTER — Ambulatory Visit (INDEPENDENT_AMBULATORY_CARE_PROVIDER_SITE_OTHER): Payer: Medicare Other | Admitting: Pharmacist Clinician (PhC)/ Clinical Pharmacy Specialist

## 2020-06-18 VITALS — BP 120/58 | HR 97 | Ht 62.0 in | Wt 178.0 lb

## 2020-06-18 DIAGNOSIS — E782 Mixed hyperlipidemia: Secondary | ICD-10-CM | POA: Diagnosis not present

## 2020-06-18 DIAGNOSIS — I4821 Permanent atrial fibrillation: Secondary | ICD-10-CM

## 2020-06-18 DIAGNOSIS — I4891 Unspecified atrial fibrillation: Secondary | ICD-10-CM

## 2020-06-18 DIAGNOSIS — I951 Orthostatic hypotension: Secondary | ICD-10-CM

## 2020-06-18 DIAGNOSIS — Z7901 Long term (current) use of anticoagulants: Secondary | ICD-10-CM

## 2020-06-18 LAB — POCT INR: INR: 1.9 — AB (ref 2.0–3.0)

## 2020-06-18 MED ORDER — DILTIAZEM HCL ER COATED BEADS 240 MG PO CP24
240.0000 mg | ORAL_CAPSULE | Freq: Every day | ORAL | 3 refills | Status: DC
Start: 2020-06-18 — End: 2021-01-13

## 2020-06-18 NOTE — Patient Instructions (Signed)
Medication Instructions:  INCREASE DILTIAZEM 240MG  DAILY *If you need a refill on your cardiac medications before your next appointment, please call your pharmacy*  Lab Work:   Testing/Procedures:  NONE    NONE  Special Instructions PLEASE READ AND FOLLOW SALTY 6-ATTACHED  Follow-Up: Your next appointment:  3 month(s) In Person with K. Mali Hilty, MD OR IF UNAVAILABLE JESSE CLEAVER, FNP-C   At [facility], you and your health needs are our priority.  As part of our continuing mission to provide you with exceptional heart care, we have created designated Provider Care Teams.  These Care Teams include your primary Cardiologist (physician) and Advanced Practice Providers (APPs -  Physician Assistants and Nurse Practitioners) who all work together to provide you with the care you need, when you need it.

## 2020-06-21 DIAGNOSIS — I509 Heart failure, unspecified: Secondary | ICD-10-CM | POA: Diagnosis not present

## 2020-06-21 DIAGNOSIS — I110 Hypertensive heart disease with heart failure: Secondary | ICD-10-CM | POA: Diagnosis not present

## 2020-06-21 DIAGNOSIS — N390 Urinary tract infection, site not specified: Secondary | ICD-10-CM | POA: Diagnosis not present

## 2020-06-21 DIAGNOSIS — A419 Sepsis, unspecified organism: Secondary | ICD-10-CM | POA: Diagnosis not present

## 2020-06-21 DIAGNOSIS — G35 Multiple sclerosis: Secondary | ICD-10-CM | POA: Diagnosis not present

## 2020-06-21 DIAGNOSIS — J9621 Acute and chronic respiratory failure with hypoxia: Secondary | ICD-10-CM | POA: Diagnosis not present

## 2020-06-22 ENCOUNTER — Ambulatory Visit (INDEPENDENT_AMBULATORY_CARE_PROVIDER_SITE_OTHER): Payer: Medicare Other | Admitting: Cardiovascular Disease

## 2020-06-22 DIAGNOSIS — A419 Sepsis, unspecified organism: Secondary | ICD-10-CM | POA: Diagnosis not present

## 2020-06-22 DIAGNOSIS — N390 Urinary tract infection, site not specified: Secondary | ICD-10-CM | POA: Diagnosis not present

## 2020-06-22 DIAGNOSIS — G35 Multiple sclerosis: Secondary | ICD-10-CM | POA: Diagnosis not present

## 2020-06-22 DIAGNOSIS — J9621 Acute and chronic respiratory failure with hypoxia: Secondary | ICD-10-CM | POA: Diagnosis not present

## 2020-06-22 DIAGNOSIS — Z7901 Long term (current) use of anticoagulants: Secondary | ICD-10-CM | POA: Diagnosis not present

## 2020-06-22 DIAGNOSIS — I509 Heart failure, unspecified: Secondary | ICD-10-CM | POA: Diagnosis not present

## 2020-06-22 DIAGNOSIS — I110 Hypertensive heart disease with heart failure: Secondary | ICD-10-CM | POA: Diagnosis not present

## 2020-06-22 LAB — POCT INR: INR: 2.1 (ref 2.0–3.0)

## 2020-06-23 DIAGNOSIS — R3914 Feeling of incomplete bladder emptying: Secondary | ICD-10-CM | POA: Diagnosis not present

## 2020-06-23 DIAGNOSIS — N3946 Mixed incontinence: Secondary | ICD-10-CM | POA: Diagnosis not present

## 2020-06-23 DIAGNOSIS — N3020 Other chronic cystitis without hematuria: Secondary | ICD-10-CM | POA: Diagnosis not present

## 2020-06-23 DIAGNOSIS — C642 Malignant neoplasm of left kidney, except renal pelvis: Secondary | ICD-10-CM | POA: Diagnosis not present

## 2020-06-24 ENCOUNTER — Inpatient Hospital Stay: Payer: Medicare Other | Attending: Gynecologic Oncology | Admitting: Gynecologic Oncology

## 2020-06-24 ENCOUNTER — Other Ambulatory Visit: Payer: Self-pay

## 2020-06-24 ENCOUNTER — Encounter: Payer: Self-pay | Admitting: Gynecologic Oncology

## 2020-06-24 VITALS — BP 99/47 | HR 89 | Temp 96.9°F | Resp 18

## 2020-06-24 DIAGNOSIS — G35 Multiple sclerosis: Secondary | ICD-10-CM | POA: Diagnosis not present

## 2020-06-24 DIAGNOSIS — E119 Type 2 diabetes mellitus without complications: Secondary | ICD-10-CM | POA: Diagnosis not present

## 2020-06-24 DIAGNOSIS — G4730 Sleep apnea, unspecified: Secondary | ICD-10-CM | POA: Diagnosis not present

## 2020-06-24 DIAGNOSIS — R269 Unspecified abnormalities of gait and mobility: Secondary | ICD-10-CM | POA: Diagnosis not present

## 2020-06-24 DIAGNOSIS — I509 Heart failure, unspecified: Secondary | ICD-10-CM | POA: Insufficient documentation

## 2020-06-24 DIAGNOSIS — Z86718 Personal history of other venous thrombosis and embolism: Secondary | ICD-10-CM | POA: Diagnosis not present

## 2020-06-24 DIAGNOSIS — F419 Anxiety disorder, unspecified: Secondary | ICD-10-CM | POA: Diagnosis not present

## 2020-06-24 DIAGNOSIS — E785 Hyperlipidemia, unspecified: Secondary | ICD-10-CM | POA: Insufficient documentation

## 2020-06-24 DIAGNOSIS — K219 Gastro-esophageal reflux disease without esophagitis: Secondary | ICD-10-CM | POA: Insufficient documentation

## 2020-06-24 DIAGNOSIS — Z7901 Long term (current) use of anticoagulants: Secondary | ICD-10-CM | POA: Diagnosis not present

## 2020-06-24 DIAGNOSIS — I110 Hypertensive heart disease with heart failure: Secondary | ICD-10-CM | POA: Diagnosis not present

## 2020-06-24 DIAGNOSIS — E039 Hypothyroidism, unspecified: Secondary | ICD-10-CM | POA: Diagnosis not present

## 2020-06-24 DIAGNOSIS — Z79899 Other long term (current) drug therapy: Secondary | ICD-10-CM | POA: Insufficient documentation

## 2020-06-24 DIAGNOSIS — Z85528 Personal history of other malignant neoplasm of kidney: Secondary | ICD-10-CM | POA: Insufficient documentation

## 2020-06-24 DIAGNOSIS — N8502 Endometrial intraepithelial neoplasia [EIN]: Secondary | ICD-10-CM | POA: Diagnosis not present

## 2020-06-24 DIAGNOSIS — F329 Major depressive disorder, single episode, unspecified: Secondary | ICD-10-CM | POA: Diagnosis not present

## 2020-06-24 DIAGNOSIS — R0683 Snoring: Secondary | ICD-10-CM | POA: Diagnosis not present

## 2020-06-24 DIAGNOSIS — N858 Other specified noninflammatory disorders of uterus: Secondary | ICD-10-CM | POA: Diagnosis not present

## 2020-06-24 DIAGNOSIS — Z87891 Personal history of nicotine dependence: Secondary | ICD-10-CM | POA: Diagnosis not present

## 2020-06-24 DIAGNOSIS — M1990 Unspecified osteoarthritis, unspecified site: Secondary | ICD-10-CM | POA: Insufficient documentation

## 2020-06-24 DIAGNOSIS — I480 Paroxysmal atrial fibrillation: Secondary | ICD-10-CM | POA: Insufficient documentation

## 2020-06-24 NOTE — Progress Notes (Signed)
Gynecologic Oncology Return Clinic Visit  06/24/20  Reason for Visit: surveillance in the setting of hormonal treatment of CAH  Treatment History: Patient presented with vaginal discharge 01/08/20 - Pelvic ultrasound showed stable 3.5cm adnexal mass and thickened endometrial lining with large polyp noted on SIS 01/15/20 - hysteroscopy with polypectomy and D&C. Pathology: Complex atypical hyperplasia.  Endometrial curettings: Scant endocervical type glandular tissue, insufficient endometrial tissue. 02/16/20 - D&C, Mirena IUD insertion. Pathology: benign inactive endometrium with focal secretory changes, no hyperplasia or carcinoma.  Interval History: Overall, from a gynecologic standpoint, she has done quite well since surgery.  She had several days of vaginal bleeding after the [facility] and IUD insertion.  Since that time, she denies any vaginal bleeding or discharge.  She denies any pelvic pain.  She had hot flashes for several weeks after the surgery but reports these stopped.  The patient has had two admissions since her last visit with me for UTI or concern for sepsis presumed to be secondary to recurrent UTI. She was most recently discharged on 10/22.  She reports doing well since that time.  Past Medical/Surgical History: Past Medical History:  Diagnosis Date  . Abnormality of gait 03/01/2015  . Anxiety   . Arthritis    "knees" (01/19/2016)  . Asthma    as a child   . Back pain   . Cancer ([place])    / RENAL CELL CARCINOMA - GETS CT SCAN EVERY 6 MONTHS TO WATCH   . CHF (congestive heart failure) ([place])   . Chronic pain    "nerve pain from the MS"  . Complex atypical endometrial hyperplasia   . Constipation   . Depression   . Diabetes mellitus without complication ([place])   . DVT (deep venous thrombosis) ([place]) 1983   "RLE; may have just been phlebitis; it was before the age of dopplers"  . Dysrhythmia    afib   . Edema    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' ablation  of R GSV 2012  . GERD (gastroesophageal reflux disease)   . HLD (hyperlipidemia)   . Hypertension   . Hypotension   . Hypothyroidism   . Joint pain   . Multiple sclerosis ([place])    has had this may 1989-[place] reg doc  . OSA on CPAP    bipap machine - SLEEP APNEA WORSE SINCE COVID IN 1/21 PER PATIENT SETTING HAS INCREASED FROM 9 TO 30   . Osteoarthritis   . PAF (paroxysmal atrial fibrillation) ([place])    on coumadin; documented on monitor 06/2010  . Pneumonia 11/2011  . Sleep apnea     Past Surgical History:  Procedure Laterality Date  . ANTERIOR CERVICAL DECOMP/DISCECTOMY FUSION  01/2004  . CARDIAC CATHETERIZATION  06/22/2010   normal coronary arteries, PAF  . CATARACT EXTRACTION, BILATERAL Bilateral   . CHILD BIRTHS     X2  . DILATATION & CURETTAGE/HYSTEROSCOPY WITH MYOSURE N/A 01/15/2020   Procedure: DILATATION & CURETTAGE/HYSTEROSCOPY WITH MYOSURE;  Surgeon: Arvella Nigh, MD;  Location: [facility];  Service: Gynecology;  Laterality: N/A;  pt in wheelchair-has MS  . DILATION AND CURETTAGE OF UTERUS    . DILATION AND CURETTAGE OF UTERUS N/A 02/16/2020   Procedure: DILATATION AND CURETTAGE;  Surgeon: Lafonda Mosses, MD;  Location: [facility];  Service: Gynecology;  Laterality: N/A;  NOT GENERAL -NO INTUBATION  . INFUSION PUMP IMPLANTATION  ~ 2009   baclofen infusion in lower abd  . INTRAUTERINE DEVICE (IUD) INSERTION  N/A 02/16/2020   Procedure: INTRAUTERINE DEVICE (IUD) INSERTION - MIRENA;  Surgeon: Lafonda Mosses, MD;  Location: [facility];  Service: Gynecology;  Laterality: N/A;  . PAIN PUMP REVISION N/A 07/29/2014   Procedure: Baclofen pump replacement;  Surgeon: Erline Levine, MD;  Location: [facility];  Service: Neurosurgery;  Laterality: N/A;  Baclofen pump replacement  . TONSILLECTOMY AND ADENOIDECTOMY    . VARICOSE VEIN SURGERY  ~ 1968  . VENOUS ABLATION  12/16/2010   radiofreq ablation -Dr Elisabeth Cara and [facility]  . WISDOM TOOTH EXTRACTION      Family History  Problem Relation  Age of Onset  . Coronary artery disease Father        at age 70  . Hyperlipidemia Father   . Thyroid disease Father   . Coronary artery disease Maternal Grandmother   . Depression Maternal Grandmother   . Cancer Paternal Grandmother        breast  . Depression Mother   . Sudden death Mother   . Anxiety disorder Mother   . Alcoholism Son     Social History   Socioeconomic History  . Marital status: Married    Spouse name: [name]  . Number of children: 2  . Years of education: COLLEGE  . Highest education level: Not on file  Occupational History  . Occupation: RETIRED  Tobacco Use  . Smoking status: Former Smoker    Packs/day: 0.50    Years: 7.00    Pack years: 3.50    Types: Cigarettes    Quit date: 08/21/1976    Years since quitting: 43.8  . Smokeless tobacco: Never Used  Vaping Use  . Vaping Use: Never used  Substance and Sexual Activity  . Alcohol use: Not Currently  . Drug use: No  . Sexual activity: Not Currently  Other Topics Concern  . Not on file  Social History Narrative   Lives in [place] with Husband [name] [phone], [phone]   Right handed   Drinks no caffeine   Social Determinants of Health   Financial Resource Strain:   . Difficulty of Paying Living Expenses: Not on file  Food Insecurity:   . Worried About Charity fundraiser in the Last Year: Not on file  . Ran Out of Food in the Last Year: Not on file  Transportation Needs:   . Lack of Transportation (Medical): Not on file  . Lack of Transportation (Non-Medical): Not on file  Physical Activity:   . Days of Exercise per Week: Not on file  . Minutes of Exercise per Session: Not on file  Stress:   . Feeling of Stress : Not on file  Social Connections:   . Frequency of Communication with Friends and Family: Not on file  . Frequency of Social Gatherings with Friends and Family: Not on file  . Attends Religious Services: Not on file  . Active Member of Clubs or Organizations: Not on file  . Attends  Archivist Meetings: Not on file  . Marital Status: Not on file    Current Medications:  Current Outpatient Medications:  .  acetaminophen (TYLENOL) 500 MG tablet, Take 500-1,000 mg by mouth every 6 (six) hours as needed for fever or headache (or pain)., Disp: , Rfl:  .  ALPRAZolam (XANAX) 0.5 MG tablet, Take 1 tablet (0.5 mg total) by mouth 3 (three) times daily as needed for anxiety or sleep., Disp: 90 tablet, Rfl: 0 .  baclofen (LIORESAL) 20 MG tablet, Take 1 tablet (20  mg total) by mouth 2 (two) times daily as needed (if the pump needs a boost). (Patient taking differently: Take 20 mg by mouth 2 (two) times daily as needed for muscle spasms. ), Disp: 180 tablet, Rfl: 0 .  BIOTIN PO, Take 1 tablet by mouth daily., Disp: , Rfl:  .  buPROPion (WELLBUTRIN XL) 300 MG 24 hr tablet, Take 1 tablet (300 mg total) by mouth daily., Disp: 30 tablet, Rfl: 0 .  Calcium Carb-Cholecalciferol (CALCIUM + D3 PO), Take 2 tablets by mouth in the morning and at bedtime., Disp: , Rfl:  .  cephALEXin (KEFLEX) 250 MG capsule, Take 250 mg by mouth daily., Disp: , Rfl:  .  cetirizine (ZYRTEC) 10 MG tablet, Take 10 mg by mouth daily as needed for allergies. , Disp: , Rfl:  .  Cholecalciferol (VITAMIN D3) 50 MCG (2000 UT) TABS, Take 2,000 Units by mouth in the morning and at bedtime., Disp: , Rfl:  .  Coenzyme Q10 (CO Q-10 PO), Take 1 capsule by mouth daily. , Disp: , Rfl:  .  corticotropin (ACTHAR) 80 UNIT/ML injectable gel, Inject 1 mL (80 Units total) into the muscle every other day. For 10 days each month. (Patient taking differently: Inject 80 Units into the skin See admin instructions. Inject 80 units into the skin as directed every other day for a period of 10 days each month), Disp: 5 mL, Rfl: 5 .  diltiazem (CARDIZEM CD) 240 MG 24 hr capsule, Take 1 capsule (240 mg total) by mouth daily., Disp: 30 capsule, Rfl: 3 .  docusate sodium (COLACE) 100 MG capsule, Take 1 capsule (100 mg total) by mouth daily.  (Patient taking differently: Take 200 mg by mouth daily as needed for mild constipation. ), Disp: 10 capsule, Rfl: 0 .  famotidine (PEPCID) 20 MG tablet, Take 20 mg by mouth at bedtime. , Disp: , Rfl: 6 .  furosemide (LASIX) 20 MG tablet, Take 1 tablet by mouth daily, may take second dose if needed for excess edema (Patient taking differently: Take 20 mg by mouth in the morning and at bedtime. ), Disp: 180 tablet, Rfl: 3 .  gabapentin (NEURONTIN) 300 MG capsule, TAKE 1 CAPSULE BY MOUTH UP  TO 4 TIMES DAILY (Patient taking differently: Take 300 mg by mouth every 6 (six) hours. ), Disp: 360 capsule, Rfl: 3 .  lactulose, encephalopathy, (GENERLAC) 10 GM/15ML SOLN, TAKE 15 ML BY MOUTH TWICE DAILY AS NEEDED (Patient taking differently: Take 10 g by mouth See admin instructions. Take 10 grams by mouth once a day and an additional 10 grams once daily, as needed to move the bowels), Disp: 2717 mL, Rfl: 1 .  levothyroxine (SYNTHROID, LEVOTHROID) 75 MCG tablet, Take 75 mcg by mouth daily before breakfast. , Disp: , Rfl:  .  linaclotide (LINZESS) 145 MCG CAPS capsule, Take 1 capsule (145 mcg total) by mouth daily before breakfast., Disp: 90 capsule, Rfl: 3 .  melatonin 5 MG TABS, Take 5 mg by mouth at bedtime. , Disp: , Rfl:  .  midodrine (PROAMATINE) 5 MG tablet, Take 1 tablet (5 mg total) by mouth 3 (three) times daily with meals. (Patient taking differently: Take 5 mg by mouth 2 (two) times daily as needed (for blood pressure). ), Disp: 270 tablet, Rfl: 3 .  Omega-3 Fatty Acids (FISH OIL PO), Take 1 capsule by mouth daily., Disp: , Rfl:  .  omeprazole (PRILOSEC) 20 MG capsule, Take 20 mg by mouth daily before breakfast. , Disp: , Rfl:  .  ondansetron (ZOFRAN) 8 MG tablet, Take 8 mg by mouth every morning. , Disp: , Rfl:  .  oxybutynin (DITROPAN) 5 MG tablet, Take 5 mg by mouth 3 (three) times daily as needed for bladder spasms. , Disp: , Rfl:  .  psyllium (METAMUCIL SMOOTH TEXTURE) 28 % packet, Take 1 packet by  mouth every evening. , Disp: , Rfl:  .  simvastatin (ZOCOR) 10 MG tablet, Take 10 mg by mouth every evening. , Disp: , Rfl:  .  SYRINGE/NEEDLE, DISP, 1 ML (BD LUER-LOK SYRINGE) 25G X 5/8" 1 ML MISC, Use as directed to inject  Acthar, Disp: 10 each, Rfl: prn .  Tamsulosin HCl (FLOMAX) 0.4 MG CAPS, Take 0.4 mg by mouth in the morning. , Disp: , Rfl:  .  warfarin (COUMADIN) 5 MG tablet, Take 1-1.5 tablets (5-7.5 mg total) by mouth See admin instructions. Take 5 mg by mouth in the late evening on Sun/Tues/Wed/Thurs/Fri/Sat and 7.5 mg on Mondays (Patient taking differently: Take 2.5-5 mg by mouth See admin instructions. Take 1 tablet (5 mg totally) by mouth in the late evening on Sun/Tues/Wed/Thurs/Sat. Except 0.5 tablet ( 2.5 mg totally) by mouth on Monday, Friday), Disp: , Rfl:   Review of Systems: + F/C, incontinence Denies appetite changes, fatigue, unexplained weight changes. Denies hearing loss, neck lumps or masses, mouth sores, ringing in ears or voice changes. Denies cough or wheezing.  Denies shortness of breath. Denies chest pain or palpitations. Denies leg swelling. Denies abdominal distention, pain, blood in stools, constipation, diarrhea, nausea, vomiting, or early satiety. Denies pain with intercourse, dysuria, frequency, hematuria. Denies hot flashes, pelvic pain, vaginal bleeding or vaginal discharge.   Denies joint pain, back pain or muscle pain/cramps. Denies itching, rash, or wounds. Denies dizziness, headaches, numbness or seizures. Denies swollen lymph nodes or glands, denies easy bruising or bleeding. Denies anxiety, depression, confusion, or decreased concentration.  Physical Exam: BP (!) 99/47 (BP Location: Right Arm, Patient Position: Sitting) Comment: provider notified  Pulse 89   Temp (!) 96.9 F (36.1 C) (Tympanic)   Resp 18   SpO2 98%  General: Alert, oriented, no acute distress. HEENT: Normocephalic, atraumatic, sclera anicteric. Chest: Unlabored breathing on  room air. GU: Exam performed in patient's recumbent wheelchair.  Normal appearing external genitalia without erythema, excoriation, or lesions.  Speculum exam reveals mildly atrophic vaginal mucosa.  Cervix normal in appearance, IUD strings visible.  Endometrial biopsy procedure Preoperative diagnosis: Complex atypical hyperplasia, currently being treated with progesterone Postoperative diagnosis: Same as above Procedure: Endometrial biopsy Physician: Valarie Cones MD Anesthesia: none EBL: none Specimen: endometrium Specimen disposition: placed in formalin, sent to pathology Complications: none apparent Disposition: home Procedure:  The procedure was explained and verbal consent obtained.  The speculum was placed and the cervix was prepped with Betadine x3.  Tenaculum was placed on the anterior lip of the cervix. The endometrial Pipelle was inserted to a depth of approximately 7 cm.  Sufficient tissue was obtained with 1 pass.  All instruments were removed from the vagina. The patient tolerated procedure.   Laboratory & Radiologic Studies: None new  Assessment & Plan: Toni [name] is a 74 y.o. woman with a complex past medical history found to have complex atypical hyperplasia, likely combined to an endometrial polyp, presenting for follow-up and endometrial biopsy after 5 months of progesterone therapy with a Mirena IUD.  Patient is doing well from a gynecologic standpoint.  She has basically had no bleeding since her procedure.  A biopsy was performed  today without difficulty.  I will contact her with the results of this.  We will tentatively plan to see her back in 3-4 months.  The patient knows to contact my office if she develops any vaginal bleeding or other symptoms prior to her next scheduled visit.  25 minutes of total time was spent for this patient encounter, including preparation, face-to-face counseling with the patient and coordination of care, and documentation of the  encounter.  Jeral Pinch, MD  Division of Gynecologic Oncology  Department of Obstetrics and Gynecology  [facility]

## 2020-06-24 NOTE — Addendum Note (Signed)
Addended by: Delorise Jackson on: 06/24/2020 12:52 PM   Modules accepted: Orders

## 2020-06-24 NOTE — Patient Instructions (Signed)
We will call you with your biopsy results (either tomorrow or early next week). I'd like to see you back in 3-4 months. Please call back in December to get an appointment scheduled in February or March (my clinic schedule is not out past December).  If you need anything in the meantime, please call me at [phone].

## 2020-06-25 DIAGNOSIS — I110 Hypertensive heart disease with heart failure: Secondary | ICD-10-CM | POA: Diagnosis not present

## 2020-06-25 DIAGNOSIS — J9621 Acute and chronic respiratory failure with hypoxia: Secondary | ICD-10-CM | POA: Diagnosis not present

## 2020-06-25 DIAGNOSIS — G35 Multiple sclerosis: Secondary | ICD-10-CM | POA: Diagnosis not present

## 2020-06-25 DIAGNOSIS — A419 Sepsis, unspecified organism: Secondary | ICD-10-CM | POA: Diagnosis not present

## 2020-06-25 DIAGNOSIS — I509 Heart failure, unspecified: Secondary | ICD-10-CM | POA: Diagnosis not present

## 2020-06-25 DIAGNOSIS — N390 Urinary tract infection, site not specified: Secondary | ICD-10-CM | POA: Diagnosis not present

## 2020-06-28 DIAGNOSIS — I509 Heart failure, unspecified: Secondary | ICD-10-CM | POA: Diagnosis not present

## 2020-06-28 DIAGNOSIS — G35 Multiple sclerosis: Secondary | ICD-10-CM | POA: Diagnosis not present

## 2020-06-28 DIAGNOSIS — I110 Hypertensive heart disease with heart failure: Secondary | ICD-10-CM | POA: Diagnosis not present

## 2020-06-28 DIAGNOSIS — A419 Sepsis, unspecified organism: Secondary | ICD-10-CM | POA: Diagnosis not present

## 2020-06-28 DIAGNOSIS — J9621 Acute and chronic respiratory failure with hypoxia: Secondary | ICD-10-CM | POA: Diagnosis not present

## 2020-06-28 DIAGNOSIS — N390 Urinary tract infection, site not specified: Secondary | ICD-10-CM | POA: Diagnosis not present

## 2020-06-28 LAB — SURGICAL PATHOLOGY

## 2020-06-29 ENCOUNTER — Telehealth: Payer: Self-pay | Admitting: Pulmonary Disease

## 2020-06-29 DIAGNOSIS — N390 Urinary tract infection, site not specified: Secondary | ICD-10-CM | POA: Diagnosis not present

## 2020-06-29 DIAGNOSIS — H6123 Impacted cerumen, bilateral: Secondary | ICD-10-CM | POA: Diagnosis not present

## 2020-06-29 DIAGNOSIS — J9621 Acute and chronic respiratory failure with hypoxia: Secondary | ICD-10-CM | POA: Diagnosis not present

## 2020-06-29 DIAGNOSIS — K5900 Constipation, unspecified: Secondary | ICD-10-CM | POA: Diagnosis not present

## 2020-06-29 DIAGNOSIS — M79661 Pain in right lower leg: Secondary | ICD-10-CM | POA: Diagnosis not present

## 2020-06-29 DIAGNOSIS — I480 Paroxysmal atrial fibrillation: Secondary | ICD-10-CM | POA: Diagnosis not present

## 2020-06-29 DIAGNOSIS — A419 Sepsis, unspecified organism: Secondary | ICD-10-CM | POA: Diagnosis not present

## 2020-06-29 DIAGNOSIS — G35 Multiple sclerosis: Secondary | ICD-10-CM | POA: Diagnosis not present

## 2020-06-29 DIAGNOSIS — G9341 Metabolic encephalopathy: Secondary | ICD-10-CM | POA: Diagnosis not present

## 2020-06-29 DIAGNOSIS — I959 Hypotension, unspecified: Secondary | ICD-10-CM | POA: Diagnosis not present

## 2020-06-29 NOTE — Telephone Encounter (Signed)
Spoke with pt and informed her we have not received the results of the ONO completed on 06/24/20. Informed pt that once results were received and reviewed office would call and review them with her. Pt. Stated understanding. Nothing further needed at this time.

## 2020-06-30 DIAGNOSIS — A419 Sepsis, unspecified organism: Secondary | ICD-10-CM | POA: Diagnosis not present

## 2020-06-30 DIAGNOSIS — G35 Multiple sclerosis: Secondary | ICD-10-CM | POA: Diagnosis not present

## 2020-06-30 DIAGNOSIS — I110 Hypertensive heart disease with heart failure: Secondary | ICD-10-CM | POA: Diagnosis not present

## 2020-06-30 DIAGNOSIS — I509 Heart failure, unspecified: Secondary | ICD-10-CM | POA: Diagnosis not present

## 2020-06-30 DIAGNOSIS — J9621 Acute and chronic respiratory failure with hypoxia: Secondary | ICD-10-CM | POA: Diagnosis not present

## 2020-06-30 DIAGNOSIS — N3000 Acute cystitis without hematuria: Secondary | ICD-10-CM | POA: Diagnosis not present

## 2020-06-30 DIAGNOSIS — N390 Urinary tract infection, site not specified: Secondary | ICD-10-CM | POA: Diagnosis not present

## 2020-07-01 ENCOUNTER — Ambulatory Visit: Payer: Medicare Other | Admitting: General Practice

## 2020-07-01 DIAGNOSIS — I110 Hypertensive heart disease with heart failure: Secondary | ICD-10-CM | POA: Diagnosis not present

## 2020-07-01 DIAGNOSIS — A419 Sepsis, unspecified organism: Secondary | ICD-10-CM | POA: Diagnosis not present

## 2020-07-01 DIAGNOSIS — I509 Heart failure, unspecified: Secondary | ICD-10-CM | POA: Diagnosis not present

## 2020-07-01 DIAGNOSIS — G35 Multiple sclerosis: Secondary | ICD-10-CM | POA: Diagnosis not present

## 2020-07-01 DIAGNOSIS — N390 Urinary tract infection, site not specified: Secondary | ICD-10-CM | POA: Diagnosis not present

## 2020-07-01 DIAGNOSIS — J9621 Acute and chronic respiratory failure with hypoxia: Secondary | ICD-10-CM | POA: Diagnosis not present

## 2020-07-02 DIAGNOSIS — I5043 Acute on chronic combined systolic (congestive) and diastolic (congestive) heart failure: Secondary | ICD-10-CM | POA: Diagnosis not present

## 2020-07-03 DIAGNOSIS — R300 Dysuria: Secondary | ICD-10-CM | POA: Diagnosis not present

## 2020-07-03 DIAGNOSIS — R319 Hematuria, unspecified: Secondary | ICD-10-CM | POA: Diagnosis not present

## 2020-07-05 DIAGNOSIS — A419 Sepsis, unspecified organism: Secondary | ICD-10-CM | POA: Diagnosis not present

## 2020-07-05 DIAGNOSIS — I509 Heart failure, unspecified: Secondary | ICD-10-CM | POA: Diagnosis not present

## 2020-07-05 DIAGNOSIS — I110 Hypertensive heart disease with heart failure: Secondary | ICD-10-CM | POA: Diagnosis not present

## 2020-07-05 DIAGNOSIS — I5043 Acute on chronic combined systolic (congestive) and diastolic (congestive) heart failure: Secondary | ICD-10-CM | POA: Diagnosis not present

## 2020-07-05 DIAGNOSIS — J9621 Acute and chronic respiratory failure with hypoxia: Secondary | ICD-10-CM | POA: Diagnosis not present

## 2020-07-05 DIAGNOSIS — G35 Multiple sclerosis: Secondary | ICD-10-CM | POA: Diagnosis not present

## 2020-07-05 DIAGNOSIS — N390 Urinary tract infection, site not specified: Secondary | ICD-10-CM | POA: Diagnosis not present

## 2020-07-06 ENCOUNTER — Ambulatory Visit (INDEPENDENT_AMBULATORY_CARE_PROVIDER_SITE_OTHER): Payer: Medicare Other | Admitting: Cardiovascular Disease

## 2020-07-06 DIAGNOSIS — G35 Multiple sclerosis: Secondary | ICD-10-CM | POA: Diagnosis not present

## 2020-07-06 DIAGNOSIS — N390 Urinary tract infection, site not specified: Secondary | ICD-10-CM | POA: Diagnosis not present

## 2020-07-06 DIAGNOSIS — I110 Hypertensive heart disease with heart failure: Secondary | ICD-10-CM | POA: Diagnosis not present

## 2020-07-06 DIAGNOSIS — Z7901 Long term (current) use of anticoagulants: Secondary | ICD-10-CM | POA: Diagnosis not present

## 2020-07-06 DIAGNOSIS — I509 Heart failure, unspecified: Secondary | ICD-10-CM | POA: Diagnosis not present

## 2020-07-06 DIAGNOSIS — J9621 Acute and chronic respiratory failure with hypoxia: Secondary | ICD-10-CM | POA: Diagnosis not present

## 2020-07-06 DIAGNOSIS — A419 Sepsis, unspecified organism: Secondary | ICD-10-CM | POA: Diagnosis not present

## 2020-07-06 LAB — POCT INR: INR: 1.9 — AB (ref 2.0–3.0)

## 2020-07-07 DIAGNOSIS — N390 Urinary tract infection, site not specified: Secondary | ICD-10-CM | POA: Diagnosis not present

## 2020-07-07 DIAGNOSIS — G35 Multiple sclerosis: Secondary | ICD-10-CM | POA: Diagnosis not present

## 2020-07-07 DIAGNOSIS — I509 Heart failure, unspecified: Secondary | ICD-10-CM | POA: Diagnosis not present

## 2020-07-07 DIAGNOSIS — J9621 Acute and chronic respiratory failure with hypoxia: Secondary | ICD-10-CM | POA: Diagnosis not present

## 2020-07-07 DIAGNOSIS — A419 Sepsis, unspecified organism: Secondary | ICD-10-CM | POA: Diagnosis not present

## 2020-07-07 DIAGNOSIS — I110 Hypertensive heart disease with heart failure: Secondary | ICD-10-CM | POA: Diagnosis not present

## 2020-07-08 DIAGNOSIS — J9621 Acute and chronic respiratory failure with hypoxia: Secondary | ICD-10-CM | POA: Diagnosis not present

## 2020-07-08 DIAGNOSIS — G35 Multiple sclerosis: Secondary | ICD-10-CM | POA: Diagnosis not present

## 2020-07-08 DIAGNOSIS — N390 Urinary tract infection, site not specified: Secondary | ICD-10-CM | POA: Diagnosis not present

## 2020-07-08 DIAGNOSIS — A419 Sepsis, unspecified organism: Secondary | ICD-10-CM | POA: Diagnosis not present

## 2020-07-08 DIAGNOSIS — I110 Hypertensive heart disease with heart failure: Secondary | ICD-10-CM | POA: Diagnosis not present

## 2020-07-08 DIAGNOSIS — I509 Heart failure, unspecified: Secondary | ICD-10-CM | POA: Diagnosis not present

## 2020-07-09 DIAGNOSIS — I10 Essential (primary) hypertension: Secondary | ICD-10-CM | POA: Diagnosis not present

## 2020-07-09 DIAGNOSIS — I5040 Unspecified combined systolic (congestive) and diastolic (congestive) heart failure: Secondary | ICD-10-CM | POA: Diagnosis not present

## 2020-07-10 ENCOUNTER — Other Ambulatory Visit: Payer: Self-pay | Admitting: Neurology

## 2020-07-12 ENCOUNTER — Other Ambulatory Visit: Payer: Self-pay | Admitting: Neurology

## 2020-07-12 ENCOUNTER — Telehealth: Payer: Self-pay

## 2020-07-12 DIAGNOSIS — G35 Multiple sclerosis: Secondary | ICD-10-CM | POA: Diagnosis not present

## 2020-07-12 DIAGNOSIS — Z993 Dependence on wheelchair: Secondary | ICD-10-CM | POA: Diagnosis not present

## 2020-07-12 DIAGNOSIS — H6123 Impacted cerumen, bilateral: Secondary | ICD-10-CM | POA: Diagnosis not present

## 2020-07-12 NOTE — Telephone Encounter (Signed)
Pt called and stated that they were started on doxycycline 100mg  bid and wanted to know when to have home health draw inr... After speaking w/kristin alvstad pharmd it was determined better to have them draw it [place]

## 2020-07-13 DIAGNOSIS — E039 Hypothyroidism, unspecified: Secondary | ICD-10-CM | POA: Diagnosis not present

## 2020-07-13 DIAGNOSIS — J9621 Acute and chronic respiratory failure with hypoxia: Secondary | ICD-10-CM | POA: Diagnosis not present

## 2020-07-13 DIAGNOSIS — I8390 Asymptomatic varicose veins of unspecified lower extremity: Secondary | ICD-10-CM | POA: Diagnosis not present

## 2020-07-13 DIAGNOSIS — M170 Bilateral primary osteoarthritis of knee: Secondary | ICD-10-CM | POA: Diagnosis not present

## 2020-07-13 DIAGNOSIS — A419 Sepsis, unspecified organism: Secondary | ICD-10-CM | POA: Diagnosis not present

## 2020-07-13 DIAGNOSIS — J45909 Unspecified asthma, uncomplicated: Secondary | ICD-10-CM | POA: Diagnosis not present

## 2020-07-13 DIAGNOSIS — G4733 Obstructive sleep apnea (adult) (pediatric): Secondary | ICD-10-CM | POA: Diagnosis not present

## 2020-07-13 DIAGNOSIS — I4821 Permanent atrial fibrillation: Secondary | ICD-10-CM | POA: Diagnosis not present

## 2020-07-13 DIAGNOSIS — N390 Urinary tract infection, site not specified: Secondary | ICD-10-CM | POA: Diagnosis not present

## 2020-07-13 DIAGNOSIS — Z6832 Body mass index (BMI) 32.0-32.9, adult: Secondary | ICD-10-CM | POA: Diagnosis not present

## 2020-07-13 DIAGNOSIS — E669 Obesity, unspecified: Secondary | ICD-10-CM | POA: Diagnosis not present

## 2020-07-13 DIAGNOSIS — K219 Gastro-esophageal reflux disease without esophagitis: Secondary | ICD-10-CM | POA: Diagnosis not present

## 2020-07-13 DIAGNOSIS — C44719 Basal cell carcinoma of skin of left lower limb, including hip: Secondary | ICD-10-CM | POA: Diagnosis not present

## 2020-07-13 DIAGNOSIS — N39490 Overflow incontinence: Secondary | ICD-10-CM | POA: Diagnosis not present

## 2020-07-13 DIAGNOSIS — I951 Orthostatic hypotension: Secondary | ICD-10-CM | POA: Diagnosis not present

## 2020-07-13 DIAGNOSIS — I480 Paroxysmal atrial fibrillation: Secondary | ICD-10-CM | POA: Diagnosis not present

## 2020-07-13 DIAGNOSIS — I509 Heart failure, unspecified: Secondary | ICD-10-CM | POA: Diagnosis not present

## 2020-07-13 DIAGNOSIS — E119 Type 2 diabetes mellitus without complications: Secondary | ICD-10-CM | POA: Diagnosis not present

## 2020-07-13 DIAGNOSIS — C649 Malignant neoplasm of unspecified kidney, except renal pelvis: Secondary | ICD-10-CM | POA: Diagnosis not present

## 2020-07-13 DIAGNOSIS — E785 Hyperlipidemia, unspecified: Secondary | ICD-10-CM | POA: Diagnosis not present

## 2020-07-13 DIAGNOSIS — F419 Anxiety disorder, unspecified: Secondary | ICD-10-CM | POA: Diagnosis not present

## 2020-07-13 DIAGNOSIS — G35 Multiple sclerosis: Secondary | ICD-10-CM | POA: Diagnosis not present

## 2020-07-13 DIAGNOSIS — I872 Venous insufficiency (chronic) (peripheral): Secondary | ICD-10-CM | POA: Diagnosis not present

## 2020-07-13 DIAGNOSIS — F32A Depression, unspecified: Secondary | ICD-10-CM | POA: Diagnosis not present

## 2020-07-13 DIAGNOSIS — I110 Hypertensive heart disease with heart failure: Secondary | ICD-10-CM | POA: Diagnosis not present

## 2020-07-13 DIAGNOSIS — K5904 Chronic idiopathic constipation: Secondary | ICD-10-CM | POA: Diagnosis not present

## 2020-07-14 ENCOUNTER — Telehealth: Payer: Self-pay

## 2020-07-14 ENCOUNTER — Telehealth: Payer: Self-pay | Admitting: Pulmonary Disease

## 2020-07-14 DIAGNOSIS — I110 Hypertensive heart disease with heart failure: Secondary | ICD-10-CM | POA: Diagnosis not present

## 2020-07-14 DIAGNOSIS — I509 Heart failure, unspecified: Secondary | ICD-10-CM | POA: Diagnosis not present

## 2020-07-14 DIAGNOSIS — J9621 Acute and chronic respiratory failure with hypoxia: Secondary | ICD-10-CM | POA: Diagnosis not present

## 2020-07-14 DIAGNOSIS — A419 Sepsis, unspecified organism: Secondary | ICD-10-CM | POA: Diagnosis not present

## 2020-07-14 DIAGNOSIS — G35 Multiple sclerosis: Secondary | ICD-10-CM | POA: Diagnosis not present

## 2020-07-14 DIAGNOSIS — N390 Urinary tract infection, site not specified: Secondary | ICD-10-CM | POA: Diagnosis not present

## 2020-07-14 NOTE — Telephone Encounter (Signed)
I do not recall seeing one come along We probably need to call for it

## 2020-07-14 NOTE — Telephone Encounter (Signed)
Pt called requesting to know the results of the ONO that was done on 11/4. Dr. Jenetta Downer, please advise if you have received these results or if we need to call DME to have them refax results to [name].

## 2020-07-14 NOTE — Telephone Encounter (Signed)
Home health nurse called to report inr readings however she stated she drew it twice b/c 1st reading got 1.9 and that didn't sit right w/them and the 2nd got 2.7. I requested a venous inr draw asap. RN from home health voiced understanding. I cannot make and inr encounter for dosing b/c the readings were so off kilter from each other. We need the venous draw to be more accurate and will dose from there

## 2020-07-16 ENCOUNTER — Telehealth: Payer: Self-pay | Admitting: Nurse Practitioner

## 2020-07-16 DIAGNOSIS — A419 Sepsis, unspecified organism: Secondary | ICD-10-CM | POA: Diagnosis not present

## 2020-07-16 DIAGNOSIS — J9621 Acute and chronic respiratory failure with hypoxia: Secondary | ICD-10-CM | POA: Diagnosis not present

## 2020-07-16 DIAGNOSIS — I110 Hypertensive heart disease with heart failure: Secondary | ICD-10-CM | POA: Diagnosis not present

## 2020-07-16 DIAGNOSIS — N390 Urinary tract infection, site not specified: Secondary | ICD-10-CM | POA: Diagnosis not present

## 2020-07-16 DIAGNOSIS — G35 Multiple sclerosis: Secondary | ICD-10-CM | POA: Diagnosis not present

## 2020-07-16 DIAGNOSIS — I509 Heart failure, unspecified: Secondary | ICD-10-CM | POA: Diagnosis not present

## 2020-07-16 LAB — POCT INR
INR: 2.5 (ref 2.0–3.0)
INR: 3.2 — AB (ref 2.0–3.0)

## 2020-07-16 NOTE — Telephone Encounter (Signed)
PCCs, is there any way you can help [name] try to obtain these results and then let me know when results have been faxed so I can then give them to Dr. Jenetta Downer.

## 2020-07-16 NOTE — Telephone Encounter (Signed)
They can send to up front fax [phone]. Thanks!

## 2020-07-16 NOTE — Telephone Encounter (Signed)
I sent message to [facility] to get results sent here Toni [name]

## 2020-07-16 NOTE — Telephone Encounter (Signed)
   HHRN - Sarah from [facility] - called to report that she was not able to successfully obtain a blood sample after 3 venous sticks on Toni [name] this afternoon.  She does have her POC machine w/ her and it passed QC test this AM.  I rec that she obtain POC INR.  She will notify us today if INR <2 or >3. Otw, she will send results to the office.  Caller verbalized understanding and was grateful for the call back.  Murray Hodgkins, NP 07/16/2020, 3:31 PM

## 2020-07-16 NOTE — Telephone Encounter (Signed)
Where do you want this faxed to

## 2020-07-19 ENCOUNTER — Telehealth: Payer: Self-pay

## 2020-07-19 ENCOUNTER — Ambulatory Visit (INDEPENDENT_AMBULATORY_CARE_PROVIDER_SITE_OTHER): Payer: Medicare Other | Admitting: Pharmacist

## 2020-07-19 ENCOUNTER — Telehealth: Payer: Self-pay | Admitting: Pulmonary Disease

## 2020-07-19 ENCOUNTER — Other Ambulatory Visit: Payer: Self-pay | Admitting: Internal Medicine

## 2020-07-19 DIAGNOSIS — N390 Urinary tract infection, site not specified: Secondary | ICD-10-CM | POA: Diagnosis not present

## 2020-07-19 DIAGNOSIS — A419 Sepsis, unspecified organism: Secondary | ICD-10-CM | POA: Diagnosis not present

## 2020-07-19 DIAGNOSIS — Z7901 Long term (current) use of anticoagulants: Secondary | ICD-10-CM

## 2020-07-19 DIAGNOSIS — I4891 Unspecified atrial fibrillation: Secondary | ICD-10-CM

## 2020-07-19 DIAGNOSIS — I509 Heart failure, unspecified: Secondary | ICD-10-CM | POA: Diagnosis not present

## 2020-07-19 DIAGNOSIS — E662 Morbid (severe) obesity with alveolar hypoventilation: Secondary | ICD-10-CM

## 2020-07-19 DIAGNOSIS — I110 Hypertensive heart disease with heart failure: Secondary | ICD-10-CM | POA: Diagnosis not present

## 2020-07-19 DIAGNOSIS — G35 Multiple sclerosis: Secondary | ICD-10-CM | POA: Diagnosis not present

## 2020-07-19 DIAGNOSIS — Z1231 Encounter for screening mammogram for malignant neoplasm of breast: Secondary | ICD-10-CM

## 2020-07-19 DIAGNOSIS — J9621 Acute and chronic respiratory failure with hypoxia: Secondary | ICD-10-CM | POA: Diagnosis not present

## 2020-07-19 DIAGNOSIS — G4733 Obstructive sleep apnea (adult) (pediatric): Secondary | ICD-10-CM

## 2020-07-19 NOTE — Telephone Encounter (Signed)
ONO was received and was placed in [place]. I will hand deliver this to Dr. Ander Slade to result.

## 2020-07-19 NOTE — Telephone Encounter (Signed)
ATC patient unable to reach no VM rang busy both times attempted to call

## 2020-07-19 NOTE — Telephone Encounter (Signed)
Incoming call from [facility] advising [name] unable to do venous draw as previously discussed last week because of 2 diverse readings on the same day same pt w/different fingers. She stated that she called and chris berge advised her to take 2 different fingersticks and average them for the inr one was 2.5 and one was 3.2 done Friday. I didn't feel comfortable making an anticoagulation encounter with these off kilter numbers so I will route to the pharmd pool and they can decide how they wish to proceed

## 2020-07-19 NOTE — Telephone Encounter (Signed)
Did you get these results

## 2020-07-19 NOTE — Telephone Encounter (Signed)
See next phone message   Dr. Ander Slade started new phone message with results.  Closing this message

## 2020-07-19 NOTE — Telephone Encounter (Signed)
Overnight oximetry reviewed showing significant desaturations with 3 L of oxygen, this appears to be related to possibly when she is having REM sleep with oxygen going as low as 70%  -This will indicate that she needs BiPAP treatment for obstructive sleep apnea and oxygen supplementation  This can only be achieved with an in lab titration study where we are able to make sure that the sleep apnea events are treated with the BiPAP and we are able to keep her oxygen where it needs to be  We can schedule her for an office visit to discuss what is needed If she is agreeable, schedule her for an in lab titration study for BiPAP and oxygen supplementation

## 2020-07-20 ENCOUNTER — Telehealth: Payer: Self-pay | Admitting: Neurology

## 2020-07-20 DIAGNOSIS — A419 Sepsis, unspecified organism: Secondary | ICD-10-CM | POA: Diagnosis not present

## 2020-07-20 DIAGNOSIS — I110 Hypertensive heart disease with heart failure: Secondary | ICD-10-CM | POA: Diagnosis not present

## 2020-07-20 DIAGNOSIS — N390 Urinary tract infection, site not specified: Secondary | ICD-10-CM | POA: Diagnosis not present

## 2020-07-20 DIAGNOSIS — I509 Heart failure, unspecified: Secondary | ICD-10-CM | POA: Diagnosis not present

## 2020-07-20 DIAGNOSIS — G35 Multiple sclerosis: Secondary | ICD-10-CM | POA: Diagnosis not present

## 2020-07-20 DIAGNOSIS — J9621 Acute and chronic respiratory failure with hypoxia: Secondary | ICD-10-CM | POA: Diagnosis not present

## 2020-07-20 NOTE — Telephone Encounter (Signed)
Patient is checking on results of pulse oximetry overnight. Patient phone number is [phone].

## 2020-07-20 NOTE — Telephone Encounter (Signed)
Pt called, can you do download of BIPAP machine. Would like a report back on that. I had a post oxygen study done. Would like a call from the nurse

## 2020-07-20 NOTE — Telephone Encounter (Signed)
Spoke with the pt and notified of results/recs per Dr Ander Slade  She verbalized understanding  She was agreeable to the BIPAP titration study  Test was ordered

## 2020-07-21 NOTE — Telephone Encounter (Signed)
Called the patient back. She had a ONO study completed and her oxygen level is still dropping. The pulmonologist has ordered the patient to do bipap titration study. I asked the patient if pulmonologist was taking over the Bipap care and she stated no. That he was the one ordering her oxygen but only because at the time she was unable to completed bipap titration study through our office due to the her level of care. Pt will have a caretaker that can come with her to have the bipap titration study completed and she would much rather continue with this through our office. She has a follow up visit with Megan on Tuesday and I advised that at that visit Jinny Blossom can discuss the need for a Bipap titration study and then we can address the oxygen level in that study at that point as well. She was appreciative. Pt states she will contact the pulmonologist office to make them aware that she will proceed forward for testing through our office.

## 2020-07-22 ENCOUNTER — Ambulatory Visit (INDEPENDENT_AMBULATORY_CARE_PROVIDER_SITE_OTHER): Payer: Medicare Other | Admitting: Cardiovascular Disease

## 2020-07-22 DIAGNOSIS — I509 Heart failure, unspecified: Secondary | ICD-10-CM | POA: Diagnosis not present

## 2020-07-22 DIAGNOSIS — I110 Hypertensive heart disease with heart failure: Secondary | ICD-10-CM | POA: Diagnosis not present

## 2020-07-22 DIAGNOSIS — Z7901 Long term (current) use of anticoagulants: Secondary | ICD-10-CM

## 2020-07-22 DIAGNOSIS — I4891 Unspecified atrial fibrillation: Secondary | ICD-10-CM

## 2020-07-22 DIAGNOSIS — J9621 Acute and chronic respiratory failure with hypoxia: Secondary | ICD-10-CM | POA: Diagnosis not present

## 2020-07-22 DIAGNOSIS — A419 Sepsis, unspecified organism: Secondary | ICD-10-CM | POA: Diagnosis not present

## 2020-07-22 DIAGNOSIS — G35 Multiple sclerosis: Secondary | ICD-10-CM | POA: Diagnosis not present

## 2020-07-22 DIAGNOSIS — N390 Urinary tract infection, site not specified: Secondary | ICD-10-CM | POA: Diagnosis not present

## 2020-07-22 LAB — POCT INR: INR: 2.7 (ref 2.0–3.0)

## 2020-07-26 DIAGNOSIS — N390 Urinary tract infection, site not specified: Secondary | ICD-10-CM | POA: Diagnosis not present

## 2020-07-26 DIAGNOSIS — J9621 Acute and chronic respiratory failure with hypoxia: Secondary | ICD-10-CM | POA: Diagnosis not present

## 2020-07-26 DIAGNOSIS — G35 Multiple sclerosis: Secondary | ICD-10-CM | POA: Diagnosis not present

## 2020-07-26 DIAGNOSIS — I110 Hypertensive heart disease with heart failure: Secondary | ICD-10-CM | POA: Diagnosis not present

## 2020-07-26 DIAGNOSIS — A419 Sepsis, unspecified organism: Secondary | ICD-10-CM | POA: Diagnosis not present

## 2020-07-26 DIAGNOSIS — I509 Heart failure, unspecified: Secondary | ICD-10-CM | POA: Diagnosis not present

## 2020-07-27 ENCOUNTER — Encounter: Payer: Self-pay | Admitting: Adult Health

## 2020-07-27 ENCOUNTER — Ambulatory Visit (INDEPENDENT_AMBULATORY_CARE_PROVIDER_SITE_OTHER): Payer: Medicare Other | Admitting: Adult Health

## 2020-07-27 ENCOUNTER — Other Ambulatory Visit: Payer: Self-pay

## 2020-07-27 VITALS — BP 98/62 | HR 63

## 2020-07-27 DIAGNOSIS — G35 Multiple sclerosis: Secondary | ICD-10-CM

## 2020-07-27 DIAGNOSIS — G4733 Obstructive sleep apnea (adult) (pediatric): Secondary | ICD-10-CM

## 2020-07-27 NOTE — Progress Notes (Signed)
PATIENT: Toni [name] DOB: [birth year]  REASON FOR VISIT: follow up HISTORY FROM: patient  HISTORY OF PRESENT ILLNESS: Today 07/27/20:  Toni [name] is a 74 year old female with a history of obstructive sleep apnea on BiPAP and multiple sclerosis.  She returns today for follow-up.  Multiple sclerosis: She continues on Acthar.   She continues to have weakness in the right lower extremity.  She states that she has a hard time pivoting when ambulating.  She is in a wheelchair today.  She does feel that her memory has changed over time.  She states that if she does not make a list she typically does not remember.  She has a caregiver as well as her husband that helps her.  Obstructive sleep apnea on BiPAP: Download indicates that she use her machine nightly for compliance of 100%.  She use her machine greater than 4 hours each night.  On average she uses it 8 hours and 50 minutes.  Her residual AHI is 21.5 on 16/11 centimeters of water.  She reports that she had an overnight pulse oximetry test with her pulmonologist that showed desaturations despite being on 3 L of oxygen at night.  She feels that her BiPAP pressure is too low.  She returns today for an evaluation.  HISTORY [name] who prefers to be called Toni [name], is a 74 year old Caucasian female with a longstanding history of multiple sclerosis.  Her primary care physician is Dr. Prince Solian, MD PhD, her cardiologist is Dr. Debara Pickett and her new pulmonologist is Dr. Ander Slade.  [name], she has a baclofen pump to deal with the spasticity remaining she had further progression after she contracted Covid, she still feels that she has not recovered her baseline which is very likely.  The patient has atrial fibrillation for which she is followed by Dr. Debara Pickett, steroid infusions that be used in the past to limit an MS exacerbation had also contrast contributed to atrophic in  onset.  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  BiPAP was no longer working and especially did not prevent hypoxemia at night.  She is using BiPAP present but it has produced an enormous oral airleak and she cannot tolerate a full facemask, he finally were able to convince her pulmonologist that she needs oxygen at 3 L either bled into this BiPAP device or to be used by nasal cannula .  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  Oncology gynecology did not find other malignant cells outside of the affected polyp.  She is now wearing an IUD that has progesterone as a Depo.  Her baclofen pump will be refilled later this month by Dr. [name].  She has been taking ACTH to prevent relapses which has been kinder to her heart rate.  The baclofen pump seems to be malfunctioning and she is addressing this with Dr. [name].  She also developed constant bladder leaking and incontinence issues she has had a Foley catheter placed unfortunately there were 250 cc of residual urine that she could not spontaneously void.  With her Foley catheter however she contracted a UTI and she is already on the third round of Macrobid.  She was diagnosed with renal cell carcinoma 5 years ago which has been status quo and  is followed yearly by urology.  Vital signs today were given by her caregiver and daughter Toni [name].  Blood pressure was 112/66 mmHg, heart rate was 74 and regular.  Her body temperature was 98.6 F, oxygen saturation was 97%.   REVIEW OF SYSTEMS: Out of a complete 14 system review of symptoms, the patient complains only of the following symptoms, and all other reviewed systems are negative.  See HPI  ALLERGIES: Allergies  Allergen Reactions  . Chlorhexidine Hives and Other (See Comments)    And, sores appear where applied  .  Hydrocodone Nausea And Vomiting and Other (See Comments)    Hydrocodone causes vomiting  . Oxycodone Nausea And Vomiting and Other (See Comments)    Oral oxycodone causes vomiting  . Zoloft [Sertraline] Hives, Swelling and Rash    HOME MEDICATIONS: Outpatient Medications Prior to Visit  Medication Sig Dispense Refill  . acetaminophen (TYLENOL) 500 MG tablet Take 500-1,000 mg by mouth every 6 (six) hours as needed for fever or headache (or pain).    . ACTHAR 80 UNIT/ML injectable gel INJECT 1ML (80 UNITS TOTAL) SUBCUTANEOUSLY EVERY OTHER  DAY FOR 10 DAYS EACH MONTH  (DISCARD 28 DAYS AFTER  FIRST USE) 5 mL 5  . ALPRAZolam (XANAX) 0.5 MG tablet TAKE 1 TABLET BY MOUTH 3  TIMES DAILY AS NEEDED FOR  ANXIETY OR SLEEP 90 tablet 0  . baclofen (LIORESAL) 20 MG tablet Take 1 tablet (20 mg total) by mouth 2 (two) times daily as needed for muscle spasms. 180 tablet 4  . BIOTIN PO Take 1 tablet by mouth daily.    Marland Kitchen buPROPion (WELLBUTRIN XL) 300 MG 24 hr tablet Take 1 tablet (300 mg total) by mouth daily. 30 tablet 0  . Calcium Carb-Cholecalciferol (CALCIUM + D3 PO) Take 2 tablets by mouth in the morning and at bedtime.    . cetirizine (ZYRTEC) 10 MG tablet Take 10 mg by mouth daily as needed for allergies.     . Cholecalciferol (VITAMIN D3) 50 MCG (2000 UT) TABS Take 2,000 Units by mouth in the morning and at bedtime.    . Coenzyme Q10 (CO Q-10 PO) Take 1 capsule by mouth daily.     Marland Kitchen docusate sodium (COLACE) 100 MG capsule Take 1 capsule (100 mg total) by mouth daily. (Patient taking differently: Take 200 mg by mouth daily as needed for mild constipation. ) 10 capsule 0  . famotidine (PEPCID) 20 MG tablet Take 20 mg by mouth at bedtime.   6  . furosemide (LASIX) 20 MG tablet Take 1 tablet by mouth daily, may take second dose if needed for excess edema (Patient taking differently: Take 20 mg by mouth in the morning and at bedtime. ) 180 tablet 3  . gabapentin (NEURONTIN) 300 MG capsule TAKE 1 CAPSULE BY  MOUTH UP  TO 4 TIMES DAILY (Patient taking differently: Take 300 mg by mouth every 6 (six) hours. ) 360 capsule 3  . lactulose, encephalopathy, (GENERLAC) 10 GM/15ML SOLN TAKE 15 ML BY MOUTH TWICE DAILY AS NEEDED (Patient taking differently: Take 10 g by mouth See admin instructions. Take 10 grams by mouth once a day and an additional 10 grams once daily, as needed to move the bowels) 2717 mL 1  . levothyroxine (SYNTHROID, LEVOTHROID) 75 MCG tablet Take 75 mcg by mouth daily before breakfast.     . linaclotide (LINZESS) 145 MCG CAPS capsule Take 1 capsule (145 mcg total) by mouth daily before breakfast. 90 capsule 3  . melatonin  5 MG TABS Take 5 mg by mouth at bedtime.     . midodrine (PROAMATINE) 5 MG tablet Take 1 tablet (5 mg total) by mouth 3 (three) times daily with meals. (Patient taking differently: Take 5 mg by mouth 2 (two) times daily as needed (for blood pressure). ) 270 tablet 3  . Omega-3 Fatty Acids (FISH OIL PO) Take 1 capsule by mouth daily.    Marland Kitchen omeprazole (PRILOSEC) 20 MG capsule Take 20 mg by mouth daily before breakfast.     . ondansetron (ZOFRAN) 8 MG tablet Take 8 mg by mouth every morning.     Marland Kitchen oxybutynin (DITROPAN) 5 MG tablet Take 5 mg by mouth 3 (three) times daily as needed for bladder spasms.     Marland Kitchen psyllium (METAMUCIL SMOOTH TEXTURE) 28 % packet Take 1 packet by mouth every evening.     . simvastatin (ZOCOR) 10 MG tablet Take 10 mg by mouth every evening.     Marland Kitchen SYRINGE/NEEDLE, DISP, 1 ML (BD LUER-LOK SYRINGE) 25G X 5/8" 1 ML MISC Use as directed to inject  Acthar 10 each prn  . Tamsulosin HCl (FLOMAX) 0.4 MG CAPS Take 0.4 mg by mouth in the morning.     . warfarin (COUMADIN) 5 MG tablet Take 1-1.5 tablets (5-7.5 mg total) by mouth See admin instructions. Take 5 mg by mouth in the late evening on Sun/Tues/Wed/Thurs/Fri/Sat and 7.5 mg on Mondays (Patient taking differently: Take 2.5-5 mg by mouth See admin instructions. Take 1 tablet (5 mg totally) by mouth in the late  evening on Sun/Tues/Wed/Thurs/Sat. Except 0.5 tablet ( 2.5 mg totally) by mouth on Monday, Friday)    . diltiazem (CARDIZEM CD) 240 MG 24 hr capsule Take 1 capsule (240 mg total) by mouth daily. 30 capsule 3   No facility-administered medications prior to visit.    PAST MEDICAL HISTORY: Past Medical History:  Diagnosis Date  . Abnormality of gait 03/01/2015  . Anxiety   . Arthritis    "knees" (01/19/2016)  . Asthma    as a child   . Back pain   . Cancer ([place])    / RENAL CELL CARCINOMA - GETS CT SCAN EVERY 6 MONTHS TO WATCH   . CHF (congestive heart failure) ([place])   . Chronic pain    "nerve pain from the MS"  . Complex atypical endometrial hyperplasia   . Constipation   . Depression   . Diabetes mellitus without complication ([place])   . DVT (deep venous thrombosis) ([place]) 1983   "RLE; may have just been phlebitis; it was before the age of dopplers"  . Dysrhythmia    afib   . 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 GERD (gastroesophageal reflux disease)   . HLD (hyperlipidemia)   . Hypertension   . Hypotension   . Hypothyroidism   . Joint pain   . Multiple sclerosis ([name])    has had this may 1989-[place] reg doc  . OSA on CPAP    bipap machine - SLEEP APNEA WORSE SINCE COVID IN 1/21 PER PATIENT SETTING HAS INCREASED FROM 9 TO 30   . Osteoarthritis   . PAF (paroxysmal atrial fibrillation) ([place])    on coumadin; documented on monitor 06/2010  . Pneumonia 11/2011  . Sleep apnea     PAST SURGICAL HISTORY: Past Surgical History:  Procedure Laterality Date  . ANTERIOR CERVICAL DECOMP/DISCECTOMY FUSION  01/2004  .  CARDIAC CATHETERIZATION  06/22/2010   normal coronary arteries, PAF  . CATARACT EXTRACTION, BILATERAL Bilateral   . CHILD BIRTHS     X2  . DILATATION & CURETTAGE/HYSTEROSCOPY WITH MYOSURE N/A 01/15/2020   Procedure: DILATATION & CURETTAGE/HYSTEROSCOPY WITH MYOSURE;  Surgeon: Arvella Nigh, MD;  Location: [facility];   Service: Gynecology;  Laterality: N/A;  pt in wheelchair-has MS  . DILATION AND CURETTAGE OF UTERUS    . DILATION AND CURETTAGE OF UTERUS N/A 02/16/2020   Procedure: DILATATION AND CURETTAGE;  Surgeon: Lafonda Mosses, MD;  Location: [facility];  Service: Gynecology;  Laterality: N/A;  NOT GENERAL -NO INTUBATION  . INFUSION PUMP IMPLANTATION  ~ 2009   baclofen infusion in lower abd  . INTRAUTERINE DEVICE (IUD) INSERTION N/A 02/16/2020   Procedure: INTRAUTERINE DEVICE (IUD) INSERTION - MIRENA;  Surgeon: Lafonda Mosses, MD;  Location: [facility];  Service: Gynecology;  Laterality: N/A;  . PAIN PUMP REVISION N/A 07/29/2014   Procedure: Baclofen pump replacement;  Surgeon: Erline Levine, MD;  Location: [facility];  Service: Neurosurgery;  Laterality: N/A;  Baclofen pump replacement  . TONSILLECTOMY AND ADENOIDECTOMY    . VARICOSE VEIN SURGERY  ~ 1968  . VENOUS ABLATION  12/16/2010   radiofreq ablation -Dr Elisabeth Cara and [facility]  . WISDOM TOOTH EXTRACTION      FAMILY HISTORY: Family History  Problem Relation Age of Onset  . Coronary artery disease Father        at age 31  . Hyperlipidemia Father   . Thyroid disease Father   . Coronary artery disease Maternal Grandmother   . Depression Maternal Grandmother   . Cancer Paternal Grandmother        breast  . Depression Mother   . Sudden death Mother   . Anxiety disorder Mother   . Alcoholism Son     SOCIAL HISTORY: Social History   Socioeconomic History  . Marital status: Married    Spouse name: [name]  . Number of children: 2  . Years of education: COLLEGE  . Highest education level: Not on file  Occupational History  . Occupation: RETIRED  Tobacco Use  . Smoking status: Former Smoker    Packs/day: 0.50    Years: 7.00    Pack years: 3.50    Types: Cigarettes    Quit date: 08/21/1976    Years since quitting: 43.9  . Smokeless tobacco: Never Used  Vaping Use  . Vaping Use: Never used  Substance and Sexual Activity  . Alcohol use:  Not Currently  . Drug use: No  . Sexual activity: Not Currently  Other Topics Concern  . Not on file  Social History Narrative   Lives in [place] with Husband [name] [phone], [phone]   Right handed   Drinks no caffeine   Social Determinants of Health   Financial Resource Strain:   . Difficulty of Paying Living Expenses: Not on file  Food Insecurity:   . Worried About Charity fundraiser in the Last Year: Not on file  . Ran Out of Food in the Last Year: Not on file  Transportation Needs:   . Lack of Transportation (Medical): Not on file  . Lack of Transportation (Non-Medical): Not on file  Physical Activity:   . Days of Exercise per Week: Not on file  . Minutes of Exercise per Session: Not on file  Stress:   . Feeling of Stress : Not on file  Social Connections:   . Frequency of Communication with Friends  and Family: Not on file  . Frequency of Social Gatherings with Friends and Family: Not on file  . Attends Religious Services: Not on file  . Active Member of Clubs or Organizations: Not on file  . Attends Archivist Meetings: Not on file  . Marital Status: Not on file  Intimate Partner Violence:   . Fear of Current or Ex-Partner: Not on file  . Emotionally Abused: Not on file  . Physically Abused: Not on file  . Sexually Abused: Not on file      PHYSICAL EXAM  Vitals:   07/27/20 1324  BP: 98/62  Pulse: 63   There is no height or weight on file to calculate BMI.   No flowsheet data found. Montreal Cognitive Assessment  10/04/2015 06/02/2015 03/30/2015  Visuospatial/ Executive (0/5) 4 5 5   Naming (0/3) 3 3 3   Attention: Read list of digits (0/2) 2 2 2   Attention: Read list of letters (0/1) 1 1 1   Attention: Serial 7 subtraction starting at 100 (0/3) 3 3 3   Language: Repeat phrase (0/2) 2 2 2   Language : Fluency (0/1) 1 1 1   Abstraction (0/2) 2 2 2   Delayed Recall (0/5) 5 5 5   Orientation (0/6) 6 6 6   Total 29 30 30   Adjusted Score (based on education)  29 30 30      Generalized: Well developed, in no acute distress   Neurological examination  Mentation: Alert oriented to time, place, history taking. Follows all commands speech and language fluent Cranial nerve II-XII: Pupils were equal round reactive to light. Extraocular movements were full, visual field were full on confrontational test. Head turning and shoulder shrug  were normal and symmetric. Motor: The motor testing reveals 5 over 5 strength in all extremities with exception of the right lower extremity.  3 out of 5 strength. Sensory: Sensory testing is intact to soft touch on all 4 extremities. No evidence of extinction is noted.  Coordination: Cerebellar testing reveals good finger-nose-finger bilaterally good heel-to-shin on the left and able to complete with the right leg Gait and station: Patient is in a wheelchair Reflexes: Deep tendon reflexes are symmetric and normal bilaterally.   DIAGNOSTIC DATA (LABS, IMAGING, TESTING) - I reviewed patient records, labs, notes, testing and imaging myself where available.  Lab Results  Component Value Date   WBC 6.9 06/11/2020   HGB 11.3 (L) 06/11/2020   HCT 35.1 (L) 06/11/2020   MCV 98.0 06/11/2020   PLT 256 06/11/2020      Component Value Date/Time   NA 141 06/10/2020 0116   NA 145 (H) 03/19/2019 1517   K 4.2 06/10/2020 0116   CL 98 06/10/2020 0116   CO2 35 (H) 06/10/2020 0116   GLUCOSE 82 06/10/2020 0116   BUN 14 06/10/2020 0116   BUN 24 03/19/2019 1517   CREATININE 0.40 (L) 06/10/2020 0116   CALCIUM 8.5 (L) 06/10/2020 0116   PROT 4.8 (L) 06/05/2020 0035   PROT 5.5 (L) 03/19/2019 1517   ALBUMIN 2.6 (L) 06/05/2020 0035   ALBUMIN 3.7 03/19/2019 1517   AST 15 06/05/2020 0035   ALT 15 06/05/2020 0035   ALKPHOS 37 (L) 06/05/2020 0035   BILITOT 0.9 06/05/2020 0035   BILITOT 0.2 03/19/2019 1517   GFRNONAA >60 06/10/2020 0116   GFRAA >60 04/07/2020 0342   Lab Results  Component Value Date   CHOL 111 03/19/2019   HDL  57 03/19/2019   LDLCALC 43 03/19/2019   TRIG 53 03/19/2019  CHOLHDL 3.0 04/22/2010   Lab Results  Component Value Date   HGBA1C 4.9 06/05/2020   No results found for: [id number] Lab Results  Component Value Date   TSH 1.374 10/10/2019      ASSESSMENT AND PLAN 74 y.o. year old female  has a past medical history of Abnormality of gait (03/01/2015), Anxiety, Arthritis, Asthma, Back pain, Cancer (HCC), CHF (congestive heart failure) (HCC), Chronic pain, Complex atypical endometrial hyperplasia, Constipation, Depression, Diabetes mellitus without complication ([place]), DVT (deep venous thrombosis) ([place]) (1983), Dysrhythmia, Edema, GERD (gastroesophageal reflux disease), HLD (hyperlipidemia), Hypertension, Hypotension, Hypothyroidism, Joint pain, Multiple sclerosis ([place]), OSA on CPAP, Osteoarthritis, PAF (paroxysmal atrial fibrillation) ([place]), Pneumonia (11/2011), and Sleep apnea. here with:  1.  Obstructive sleep apnea on BiPAP  --Good compliance --Residual AHI is elevated at 21.5 events an hour --We will increase pressure to 17/12.  We will repeat a download in 2 months if no improvement will proceed with BiPAP titration  2.  Multiple sclerosis  --Continue Acthar --Continue monitoring symptoms   Advised if symptoms worsen or she develops new symptoms she should let us know we will follow-up in 2 to 3 months for repeat sleep download   I spent 30 minutes of face-to-face and non-face-to-face time with patient.  This included previsit chart review, lab review, study review, order entry, electronic health record documentation, patient education.  Ward Givens, MSN, NP-C 07/27/2020, 1:33 PM [facility] [address] [place], [place] [address] [phone]

## 2020-07-27 NOTE — Patient Instructions (Signed)
Your Plan:  Continue acthar Continue gabapentin  Pressure change on Bipap If your symptoms worsen or you develop new symptoms please let us know.       Thank you for coming to see [name] at [facility]. I hope we have been able to provide you high quality care today.  You may receive a patient satisfaction survey over the next few weeks. We would appreciate your feedback and comments so that we may continue to improve ourselves and the health of our patients.

## 2020-07-28 ENCOUNTER — Telehealth: Payer: Self-pay | Admitting: Pharmacist

## 2020-07-28 DIAGNOSIS — G35 Multiple sclerosis: Secondary | ICD-10-CM | POA: Diagnosis not present

## 2020-07-28 DIAGNOSIS — N390 Urinary tract infection, site not specified: Secondary | ICD-10-CM | POA: Diagnosis not present

## 2020-07-28 DIAGNOSIS — I509 Heart failure, unspecified: Secondary | ICD-10-CM | POA: Diagnosis not present

## 2020-07-28 DIAGNOSIS — J9621 Acute and chronic respiratory failure with hypoxia: Secondary | ICD-10-CM | POA: Diagnosis not present

## 2020-07-28 DIAGNOSIS — I110 Hypertensive heart disease with heart failure: Secondary | ICD-10-CM | POA: Diagnosis not present

## 2020-07-28 DIAGNOSIS — A419 Sepsis, unspecified organism: Secondary | ICD-10-CM | POA: Diagnosis not present

## 2020-07-28 NOTE — Progress Notes (Signed)
Order for increase pressure sent to [facility] via community msg. Confirmation received that the order transmitted was successful.

## 2020-07-28 NOTE — Telephone Encounter (Signed)
Patient wants to clarify date for next INR.  Next INR was ordered for tomorrow 12/9. Orders given to home health nurse on 12/2. Patient will call if needed.

## 2020-07-29 ENCOUNTER — Ambulatory Visit (INDEPENDENT_AMBULATORY_CARE_PROVIDER_SITE_OTHER): Payer: Medicare Other | Admitting: Cardiology

## 2020-07-29 DIAGNOSIS — G35 Multiple sclerosis: Secondary | ICD-10-CM | POA: Diagnosis not present

## 2020-07-29 DIAGNOSIS — Z7901 Long term (current) use of anticoagulants: Secondary | ICD-10-CM | POA: Diagnosis not present

## 2020-07-29 DIAGNOSIS — I4891 Unspecified atrial fibrillation: Secondary | ICD-10-CM

## 2020-07-29 DIAGNOSIS — I110 Hypertensive heart disease with heart failure: Secondary | ICD-10-CM | POA: Diagnosis not present

## 2020-07-29 DIAGNOSIS — J9621 Acute and chronic respiratory failure with hypoxia: Secondary | ICD-10-CM | POA: Diagnosis not present

## 2020-07-29 DIAGNOSIS — I509 Heart failure, unspecified: Secondary | ICD-10-CM | POA: Diagnosis not present

## 2020-07-29 DIAGNOSIS — N390 Urinary tract infection, site not specified: Secondary | ICD-10-CM | POA: Diagnosis not present

## 2020-07-29 DIAGNOSIS — A419 Sepsis, unspecified organism: Secondary | ICD-10-CM | POA: Diagnosis not present

## 2020-07-29 LAB — POCT INR: INR: 4.6 — AB (ref 2.0–3.0)

## 2020-08-02 DIAGNOSIS — G35 Multiple sclerosis: Secondary | ICD-10-CM | POA: Diagnosis not present

## 2020-08-02 DIAGNOSIS — A419 Sepsis, unspecified organism: Secondary | ICD-10-CM | POA: Diagnosis not present

## 2020-08-02 DIAGNOSIS — J9621 Acute and chronic respiratory failure with hypoxia: Secondary | ICD-10-CM | POA: Diagnosis not present

## 2020-08-02 DIAGNOSIS — N390 Urinary tract infection, site not specified: Secondary | ICD-10-CM | POA: Diagnosis not present

## 2020-08-02 DIAGNOSIS — I110 Hypertensive heart disease with heart failure: Secondary | ICD-10-CM | POA: Diagnosis not present

## 2020-08-02 DIAGNOSIS — I509 Heart failure, unspecified: Secondary | ICD-10-CM | POA: Diagnosis not present

## 2020-08-03 ENCOUNTER — Ambulatory Visit (INDEPENDENT_AMBULATORY_CARE_PROVIDER_SITE_OTHER): Payer: Medicare Other | Admitting: Pharmacist

## 2020-08-03 DIAGNOSIS — G35 Multiple sclerosis: Secondary | ICD-10-CM | POA: Diagnosis not present

## 2020-08-03 DIAGNOSIS — Z961 Presence of intraocular lens: Secondary | ICD-10-CM | POA: Diagnosis not present

## 2020-08-03 DIAGNOSIS — I4891 Unspecified atrial fibrillation: Secondary | ICD-10-CM

## 2020-08-03 DIAGNOSIS — I509 Heart failure, unspecified: Secondary | ICD-10-CM | POA: Diagnosis not present

## 2020-08-03 DIAGNOSIS — H3581 Retinal edema: Secondary | ICD-10-CM | POA: Diagnosis not present

## 2020-08-03 DIAGNOSIS — Z9889 Other specified postprocedural states: Secondary | ICD-10-CM | POA: Diagnosis not present

## 2020-08-03 DIAGNOSIS — Z7901 Long term (current) use of anticoagulants: Secondary | ICD-10-CM | POA: Diagnosis not present

## 2020-08-03 DIAGNOSIS — N390 Urinary tract infection, site not specified: Secondary | ICD-10-CM | POA: Diagnosis not present

## 2020-08-03 DIAGNOSIS — I110 Hypertensive heart disease with heart failure: Secondary | ICD-10-CM | POA: Diagnosis not present

## 2020-08-03 DIAGNOSIS — H4423 Degenerative myopia, bilateral: Secondary | ICD-10-CM | POA: Diagnosis not present

## 2020-08-03 DIAGNOSIS — J9621 Acute and chronic respiratory failure with hypoxia: Secondary | ICD-10-CM | POA: Diagnosis not present

## 2020-08-03 DIAGNOSIS — A419 Sepsis, unspecified organism: Secondary | ICD-10-CM | POA: Diagnosis not present

## 2020-08-03 LAB — POCT INR: INR: 1.4 — AB (ref 2.0–3.0)

## 2020-08-04 DIAGNOSIS — I110 Hypertensive heart disease with heart failure: Secondary | ICD-10-CM | POA: Diagnosis not present

## 2020-08-04 DIAGNOSIS — N390 Urinary tract infection, site not specified: Secondary | ICD-10-CM | POA: Diagnosis not present

## 2020-08-04 DIAGNOSIS — E876 Hypokalemia: Secondary | ICD-10-CM | POA: Diagnosis not present

## 2020-08-04 DIAGNOSIS — E249 Cushing's syndrome, unspecified: Secondary | ICD-10-CM | POA: Diagnosis not present

## 2020-08-04 DIAGNOSIS — I10 Essential (primary) hypertension: Secondary | ICD-10-CM | POA: Diagnosis not present

## 2020-08-04 DIAGNOSIS — J9621 Acute and chronic respiratory failure with hypoxia: Secondary | ICD-10-CM | POA: Diagnosis not present

## 2020-08-04 DIAGNOSIS — E119 Type 2 diabetes mellitus without complications: Secondary | ICD-10-CM | POA: Diagnosis not present

## 2020-08-04 DIAGNOSIS — G35 Multiple sclerosis: Secondary | ICD-10-CM | POA: Diagnosis not present

## 2020-08-04 DIAGNOSIS — I509 Heart failure, unspecified: Secondary | ICD-10-CM | POA: Diagnosis not present

## 2020-08-04 DIAGNOSIS — A419 Sepsis, unspecified organism: Secondary | ICD-10-CM | POA: Diagnosis not present

## 2020-08-04 DIAGNOSIS — E662 Morbid (severe) obesity with alveolar hypoventilation: Secondary | ICD-10-CM | POA: Diagnosis not present

## 2020-08-04 DIAGNOSIS — I5040 Unspecified combined systolic (congestive) and diastolic (congestive) heart failure: Secondary | ICD-10-CM | POA: Diagnosis not present

## 2020-08-05 ENCOUNTER — Telehealth: Payer: Self-pay | Admitting: Internal Medicine

## 2020-08-05 DIAGNOSIS — N390 Urinary tract infection, site not specified: Secondary | ICD-10-CM | POA: Diagnosis not present

## 2020-08-05 DIAGNOSIS — G35 Multiple sclerosis: Secondary | ICD-10-CM | POA: Diagnosis not present

## 2020-08-05 DIAGNOSIS — J9621 Acute and chronic respiratory failure with hypoxia: Secondary | ICD-10-CM | POA: Diagnosis not present

## 2020-08-05 DIAGNOSIS — A419 Sepsis, unspecified organism: Secondary | ICD-10-CM | POA: Diagnosis not present

## 2020-08-05 DIAGNOSIS — I509 Heart failure, unspecified: Secondary | ICD-10-CM | POA: Diagnosis not present

## 2020-08-05 DIAGNOSIS — I110 Hypertensive heart disease with heart failure: Secondary | ICD-10-CM | POA: Diagnosis not present

## 2020-08-05 NOTE — Telephone Encounter (Signed)
Returned call to patient of Dr. Debara Pickett who reports her [place] has crept up - HR was 122 bpm this morning. Occasional HR at night - 110, 115.   She takes diltiazem 60mg  as needed - took half tablet (30mg ) - she has taken this occasion if HR stayed above 100 for a few hours. This is not currently on her med list, but added after phone call  Her BP is generally mid 225J-505X (systolic) She has had occasional HR in 90s but this is upon change in position, such as during therapy  Asked that she send a log of her BP/HR readings via MyChart message attachment so MD can review

## 2020-08-05 NOTE — Telephone Encounter (Signed)
New message:     Patient calling stating that her BP is not running like the doctor wanted it to be and would like to know if she should be taking something.

## 2020-08-06 DIAGNOSIS — R3914 Feeling of incomplete bladder emptying: Secondary | ICD-10-CM | POA: Diagnosis not present

## 2020-08-06 DIAGNOSIS — N3020 Other chronic cystitis without hematuria: Secondary | ICD-10-CM | POA: Diagnosis not present

## 2020-08-06 NOTE — Telephone Encounter (Signed)
Patient reports her HR was 72bpm today  Advised MD has said OK to take full tablet of diltiazem 60mg  PRN for elevated HR  Patient states she takes her vitals, if HR over 100 she will recheck a little while later and if still elevated will take PRN diltiazem.  Advised to continue her plan and call back if needed

## 2020-08-06 NOTE — Telephone Encounter (Signed)
She could take the full 60 mg diltiazem tablet as needed if HR is elevated  Dr Lemmie Evens

## 2020-08-09 DIAGNOSIS — I509 Heart failure, unspecified: Secondary | ICD-10-CM | POA: Diagnosis not present

## 2020-08-09 DIAGNOSIS — N390 Urinary tract infection, site not specified: Secondary | ICD-10-CM | POA: Diagnosis not present

## 2020-08-09 DIAGNOSIS — A419 Sepsis, unspecified organism: Secondary | ICD-10-CM | POA: Diagnosis not present

## 2020-08-09 DIAGNOSIS — J9621 Acute and chronic respiratory failure with hypoxia: Secondary | ICD-10-CM | POA: Diagnosis not present

## 2020-08-09 DIAGNOSIS — G35 Multiple sclerosis: Secondary | ICD-10-CM | POA: Diagnosis not present

## 2020-08-09 DIAGNOSIS — I110 Hypertensive heart disease with heart failure: Secondary | ICD-10-CM | POA: Diagnosis not present

## 2020-08-10 ENCOUNTER — Ambulatory Visit (INDEPENDENT_AMBULATORY_CARE_PROVIDER_SITE_OTHER): Payer: Medicare Other | Admitting: Cardiovascular Disease

## 2020-08-10 DIAGNOSIS — J9621 Acute and chronic respiratory failure with hypoxia: Secondary | ICD-10-CM | POA: Diagnosis not present

## 2020-08-10 DIAGNOSIS — A419 Sepsis, unspecified organism: Secondary | ICD-10-CM | POA: Diagnosis not present

## 2020-08-10 DIAGNOSIS — I509 Heart failure, unspecified: Secondary | ICD-10-CM | POA: Diagnosis not present

## 2020-08-10 DIAGNOSIS — N390 Urinary tract infection, site not specified: Secondary | ICD-10-CM | POA: Diagnosis not present

## 2020-08-10 DIAGNOSIS — I110 Hypertensive heart disease with heart failure: Secondary | ICD-10-CM | POA: Diagnosis not present

## 2020-08-10 DIAGNOSIS — I4891 Unspecified atrial fibrillation: Secondary | ICD-10-CM

## 2020-08-10 DIAGNOSIS — G35 Multiple sclerosis: Secondary | ICD-10-CM | POA: Diagnosis not present

## 2020-08-10 DIAGNOSIS — Z7901 Long term (current) use of anticoagulants: Secondary | ICD-10-CM | POA: Diagnosis not present

## 2020-08-10 LAB — POCT INR: INR: 4.4 — AB (ref 2.0–3.0)

## 2020-08-12 DIAGNOSIS — I509 Heart failure, unspecified: Secondary | ICD-10-CM | POA: Diagnosis not present

## 2020-08-12 DIAGNOSIS — I951 Orthostatic hypotension: Secondary | ICD-10-CM | POA: Diagnosis not present

## 2020-08-12 DIAGNOSIS — G4733 Obstructive sleep apnea (adult) (pediatric): Secondary | ICD-10-CM | POA: Diagnosis not present

## 2020-08-12 DIAGNOSIS — M170 Bilateral primary osteoarthritis of knee: Secondary | ICD-10-CM | POA: Diagnosis not present

## 2020-08-12 DIAGNOSIS — I872 Venous insufficiency (chronic) (peripheral): Secondary | ICD-10-CM | POA: Diagnosis not present

## 2020-08-12 DIAGNOSIS — N39490 Overflow incontinence: Secondary | ICD-10-CM | POA: Diagnosis not present

## 2020-08-12 DIAGNOSIS — Z8616 Personal history of COVID-19: Secondary | ICD-10-CM | POA: Diagnosis not present

## 2020-08-12 DIAGNOSIS — Z87440 Personal history of urinary (tract) infections: Secondary | ICD-10-CM | POA: Diagnosis not present

## 2020-08-12 DIAGNOSIS — E119 Type 2 diabetes mellitus without complications: Secondary | ICD-10-CM | POA: Diagnosis not present

## 2020-08-12 DIAGNOSIS — I4821 Permanent atrial fibrillation: Secondary | ICD-10-CM | POA: Diagnosis not present

## 2020-08-12 DIAGNOSIS — N390 Urinary tract infection, site not specified: Secondary | ICD-10-CM | POA: Diagnosis not present

## 2020-08-12 DIAGNOSIS — E785 Hyperlipidemia, unspecified: Secondary | ICD-10-CM | POA: Diagnosis not present

## 2020-08-12 DIAGNOSIS — E669 Obesity, unspecified: Secondary | ICD-10-CM | POA: Diagnosis not present

## 2020-08-12 DIAGNOSIS — F419 Anxiety disorder, unspecified: Secondary | ICD-10-CM | POA: Diagnosis not present

## 2020-08-12 DIAGNOSIS — E039 Hypothyroidism, unspecified: Secondary | ICD-10-CM | POA: Diagnosis not present

## 2020-08-12 DIAGNOSIS — E1151 Type 2 diabetes mellitus with diabetic peripheral angiopathy without gangrene: Secondary | ICD-10-CM | POA: Diagnosis not present

## 2020-08-12 DIAGNOSIS — K219 Gastro-esophageal reflux disease without esophagitis: Secondary | ICD-10-CM | POA: Diagnosis not present

## 2020-08-12 DIAGNOSIS — J9621 Acute and chronic respiratory failure with hypoxia: Secondary | ICD-10-CM | POA: Diagnosis not present

## 2020-08-12 DIAGNOSIS — J45909 Unspecified asthma, uncomplicated: Secondary | ICD-10-CM | POA: Diagnosis not present

## 2020-08-12 DIAGNOSIS — Z6832 Body mass index (BMI) 32.0-32.9, adult: Secondary | ICD-10-CM | POA: Diagnosis not present

## 2020-08-12 DIAGNOSIS — F32A Depression, unspecified: Secondary | ICD-10-CM | POA: Diagnosis not present

## 2020-08-12 DIAGNOSIS — A419 Sepsis, unspecified organism: Secondary | ICD-10-CM | POA: Diagnosis not present

## 2020-08-12 DIAGNOSIS — I8390 Asymptomatic varicose veins of unspecified lower extremity: Secondary | ICD-10-CM | POA: Diagnosis not present

## 2020-08-12 DIAGNOSIS — G35 Multiple sclerosis: Secondary | ICD-10-CM | POA: Diagnosis not present

## 2020-08-12 DIAGNOSIS — I110 Hypertensive heart disease with heart failure: Secondary | ICD-10-CM | POA: Diagnosis not present

## 2020-08-12 DIAGNOSIS — I480 Paroxysmal atrial fibrillation: Secondary | ICD-10-CM | POA: Diagnosis not present

## 2020-08-12 DIAGNOSIS — K5904 Chronic idiopathic constipation: Secondary | ICD-10-CM | POA: Diagnosis not present

## 2020-08-12 DIAGNOSIS — C649 Malignant neoplasm of unspecified kidney, except renal pelvis: Secondary | ICD-10-CM | POA: Diagnosis not present

## 2020-08-16 DIAGNOSIS — I110 Hypertensive heart disease with heart failure: Secondary | ICD-10-CM | POA: Diagnosis not present

## 2020-08-16 DIAGNOSIS — Z87440 Personal history of urinary (tract) infections: Secondary | ICD-10-CM | POA: Diagnosis not present

## 2020-08-16 DIAGNOSIS — G35 Multiple sclerosis: Secondary | ICD-10-CM | POA: Diagnosis not present

## 2020-08-16 DIAGNOSIS — I509 Heart failure, unspecified: Secondary | ICD-10-CM | POA: Diagnosis not present

## 2020-08-16 DIAGNOSIS — J9621 Acute and chronic respiratory failure with hypoxia: Secondary | ICD-10-CM | POA: Diagnosis not present

## 2020-08-16 DIAGNOSIS — J45909 Unspecified asthma, uncomplicated: Secondary | ICD-10-CM | POA: Diagnosis not present

## 2020-08-17 ENCOUNTER — Ambulatory Visit (INDEPENDENT_AMBULATORY_CARE_PROVIDER_SITE_OTHER): Payer: Medicare Other | Admitting: Cardiology

## 2020-08-17 DIAGNOSIS — I110 Hypertensive heart disease with heart failure: Secondary | ICD-10-CM | POA: Diagnosis not present

## 2020-08-17 DIAGNOSIS — G35 Multiple sclerosis: Secondary | ICD-10-CM | POA: Diagnosis not present

## 2020-08-17 DIAGNOSIS — Z87440 Personal history of urinary (tract) infections: Secondary | ICD-10-CM | POA: Diagnosis not present

## 2020-08-17 DIAGNOSIS — Z7901 Long term (current) use of anticoagulants: Secondary | ICD-10-CM | POA: Diagnosis not present

## 2020-08-17 DIAGNOSIS — J9621 Acute and chronic respiratory failure with hypoxia: Secondary | ICD-10-CM | POA: Diagnosis not present

## 2020-08-17 DIAGNOSIS — I509 Heart failure, unspecified: Secondary | ICD-10-CM | POA: Diagnosis not present

## 2020-08-17 DIAGNOSIS — J45909 Unspecified asthma, uncomplicated: Secondary | ICD-10-CM | POA: Diagnosis not present

## 2020-08-17 LAB — POCT INR: INR: 2 (ref 2.0–3.0)

## 2020-08-18 ENCOUNTER — Ambulatory Visit: Payer: Medicare Other | Admitting: Adult Health

## 2020-08-20 DIAGNOSIS — E1151 Type 2 diabetes mellitus with diabetic peripheral angiopathy without gangrene: Secondary | ICD-10-CM | POA: Diagnosis not present

## 2020-08-20 DIAGNOSIS — J9621 Acute and chronic respiratory failure with hypoxia: Secondary | ICD-10-CM | POA: Diagnosis not present

## 2020-08-20 DIAGNOSIS — I4821 Permanent atrial fibrillation: Secondary | ICD-10-CM | POA: Diagnosis not present

## 2020-08-20 DIAGNOSIS — I951 Orthostatic hypotension: Secondary | ICD-10-CM | POA: Diagnosis not present

## 2020-08-20 DIAGNOSIS — G35 Multiple sclerosis: Secondary | ICD-10-CM | POA: Diagnosis not present

## 2020-08-20 DIAGNOSIS — J45998 Other asthma: Secondary | ICD-10-CM | POA: Diagnosis not present

## 2020-08-20 DIAGNOSIS — I480 Paroxysmal atrial fibrillation: Secondary | ICD-10-CM | POA: Diagnosis not present

## 2020-08-20 DIAGNOSIS — I110 Hypertensive heart disease with heart failure: Secondary | ICD-10-CM | POA: Diagnosis not present

## 2020-08-20 DIAGNOSIS — Z87440 Personal history of urinary (tract) infections: Secondary | ICD-10-CM | POA: Diagnosis not present

## 2020-08-20 DIAGNOSIS — I509 Heart failure, unspecified: Secondary | ICD-10-CM | POA: Diagnosis not present

## 2020-08-20 DIAGNOSIS — C649 Malignant neoplasm of unspecified kidney, except renal pelvis: Secondary | ICD-10-CM | POA: Diagnosis not present

## 2020-08-20 DIAGNOSIS — I872 Venous insufficiency (chronic) (peripheral): Secondary | ICD-10-CM | POA: Diagnosis not present

## 2020-08-23 DIAGNOSIS — N319 Neuromuscular dysfunction of bladder, unspecified: Secondary | ICD-10-CM | POA: Diagnosis not present

## 2020-08-23 DIAGNOSIS — U071 COVID-19: Secondary | ICD-10-CM | POA: Diagnosis not present

## 2020-08-23 DIAGNOSIS — I5032 Chronic diastolic (congestive) heart failure: Secondary | ICD-10-CM | POA: Diagnosis not present

## 2020-08-23 DIAGNOSIS — N390 Urinary tract infection, site not specified: Secondary | ICD-10-CM | POA: Diagnosis not present

## 2020-08-23 DIAGNOSIS — G35 Multiple sclerosis: Secondary | ICD-10-CM | POA: Diagnosis not present

## 2020-08-24 ENCOUNTER — Ambulatory Visit (INDEPENDENT_AMBULATORY_CARE_PROVIDER_SITE_OTHER): Payer: Medicare Other | Admitting: Pharmacist

## 2020-08-24 DIAGNOSIS — G35 Multiple sclerosis: Secondary | ICD-10-CM | POA: Diagnosis not present

## 2020-08-24 DIAGNOSIS — Z7901 Long term (current) use of anticoagulants: Secondary | ICD-10-CM

## 2020-08-24 DIAGNOSIS — I4891 Unspecified atrial fibrillation: Secondary | ICD-10-CM | POA: Diagnosis not present

## 2020-08-24 DIAGNOSIS — I110 Hypertensive heart disease with heart failure: Secondary | ICD-10-CM | POA: Diagnosis not present

## 2020-08-24 DIAGNOSIS — J9621 Acute and chronic respiratory failure with hypoxia: Secondary | ICD-10-CM | POA: Diagnosis not present

## 2020-08-24 DIAGNOSIS — I509 Heart failure, unspecified: Secondary | ICD-10-CM | POA: Diagnosis not present

## 2020-08-24 DIAGNOSIS — J45909 Unspecified asthma, uncomplicated: Secondary | ICD-10-CM | POA: Diagnosis not present

## 2020-08-24 DIAGNOSIS — Z87440 Personal history of urinary (tract) infections: Secondary | ICD-10-CM | POA: Diagnosis not present

## 2020-08-24 LAB — POCT INR: INR: 2.2 (ref 2.0–3.0)

## 2020-08-24 NOTE — Patient Instructions (Signed)
Description   Continue taking 2.5mg  every Monday and 5mg  every other days of the week. Repeat INR in 1 week. *New INR date given to [facility] RN

## 2020-08-25 ENCOUNTER — Telehealth: Payer: Self-pay | Admitting: Adult Health

## 2020-08-25 DIAGNOSIS — G35 Multiple sclerosis: Secondary | ICD-10-CM

## 2020-08-25 DIAGNOSIS — J9621 Acute and chronic respiratory failure with hypoxia: Secondary | ICD-10-CM | POA: Diagnosis not present

## 2020-08-25 DIAGNOSIS — J45909 Unspecified asthma, uncomplicated: Secondary | ICD-10-CM | POA: Diagnosis not present

## 2020-08-25 DIAGNOSIS — N319 Neuromuscular dysfunction of bladder, unspecified: Secondary | ICD-10-CM

## 2020-08-25 DIAGNOSIS — I110 Hypertensive heart disease with heart failure: Secondary | ICD-10-CM | POA: Diagnosis not present

## 2020-08-25 DIAGNOSIS — Z87440 Personal history of urinary (tract) infections: Secondary | ICD-10-CM | POA: Diagnosis not present

## 2020-08-25 DIAGNOSIS — Z7901 Long term (current) use of anticoagulants: Secondary | ICD-10-CM

## 2020-08-25 DIAGNOSIS — I4821 Permanent atrial fibrillation: Secondary | ICD-10-CM

## 2020-08-25 DIAGNOSIS — E119 Type 2 diabetes mellitus without complications: Secondary | ICD-10-CM

## 2020-08-25 DIAGNOSIS — I509 Heart failure, unspecified: Secondary | ICD-10-CM | POA: Diagnosis not present

## 2020-08-25 NOTE — Telephone Encounter (Signed)
It is symptom has been present and is not need I do not feel that repeating a nerve conduction study is warranted.  She can certainly mention this to Dr. Anne Hahn when he sees her later this month.

## 2020-08-25 NOTE — Progress Notes (Signed)
[facility] Note  08/26/2020     CHIEF COMPLAINT Patient presents for Retina Evaluation   HISTORY OF PRESENT ILLNESS: Toni [name] is a 75 y.o. female who presents to the clinic today for:   HPI    Retina Evaluation    In both eyes.  Context:  distance vision and near vision.  I, the attending physician,  performed the HPI with the patient and updated documentation appropriately.          Comments    NP retinal eval ref by Dr. Manuella Ghazi. Pt diagnosed with MS in 1989 Pt states her vision seems blurry at times and does not feel that her glasses prescription is accurate.  Patient states her right eye has been weaker of the two, and relies on her left eye to do most of the work.  Patient denies eye pain or discomfort--uses artificial tears PRN per Dr. Trena Platt recommendation.  Patient has had floaters for years OU; denies flashes of light OU.  Hx of CEIOL OU (Dr. Susa Simmonds) Hx of Yag PC OU (Dr. Susa Simmonds) Hx of consult with Neuro Ophthalmology (Dr. Hassell Done)       Last edited by Bernarda Caffey, MD on 08/26/2020 10:45 AM. (History)    pt is here on the referral of Dr. Manuella Ghazi for [place], pt has had cataract sx OU with Dr. Susa Simmonds, pt has also been seen by Dr. Hassell Done at [facility], pt states she saw him several years ago and everything was fine at that time, pt states right before she saw Dr. Manuella Ghazi she started seeing "thousands of little black things" in her vision, she states they are not floaters, she states things look "dirty" when she looks at them, pt has MS and had an Acthar injection yesterday and states the floaters look better today, pt had Covid in December 2020 and states ever since then her blood pressure has been low, she states her sleep apnea has gotten worse since then as well, pt has A fib and is on Warfarin  Referring physician: Dwana Melena MD [place], [place] [address]  HISTORICAL INFORMATION:   Selected notes from the medical  record:  Referred by Dr. Manuella Ghazi LEE: 12.14.21 VAcc: OD 20/70, OS 20/40 MS, Pseudo OU, YAG PC OU, Degenerative Myopia OU    CURRENT MEDICATIONS: No current outpatient medications on file. (Ophthalmic Drugs)   No current facility-administered medications for this visit. (Ophthalmic Drugs)   Current Outpatient Medications (Other)  Medication Sig  . acetaminophen (TYLENOL) 500 MG tablet Take 500-1,000 mg by mouth every 6 (six) hours as needed for fever or headache (or pain).  . ACTHAR 80 UNIT/ML injectable gel INJECT 1ML (80 UNITS TOTAL) SUBCUTANEOUSLY EVERY OTHER  DAY FOR 10 DAYS EACH MONTH  (DISCARD 28 DAYS AFTER  FIRST USE)  . ALPRAZolam (XANAX) 0.5 MG tablet TAKE 1 TABLET BY MOUTH 3  TIMES DAILY AS NEEDED FOR  ANXIETY OR SLEEP  . baclofen (LIORESAL) 20 MG tablet Take 1 tablet (20 mg total) by mouth 2 (two) times daily as needed for muscle spasms.  Marland Kitchen BIOTIN PO Take 1 tablet by mouth daily.  Marland Kitchen buPROPion (WELLBUTRIN XL) 300 MG 24 hr tablet Take 1 tablet (300 mg total) by mouth daily.  . Calcium Carb-Cholecalciferol (CALCIUM + D3 PO) Take 2 tablets by mouth in the morning and at bedtime.  . cetirizine (ZYRTEC) 10 MG tablet Take 10 mg by mouth daily as needed for allergies.   Marland Kitchen  Cholecalciferol (VITAMIN D3) 50 MCG (2000 UT) TABS Take 2,000 Units by mouth in the morning and at bedtime.  . Coenzyme Q10 (CO Q-10 PO) Take 1 capsule by mouth daily.   Marland Kitchen diltiazem (CARDIZEM CD) 240 MG 24 hr capsule Take 1 capsule (240 mg total) by mouth daily.  Marland Kitchen diltiazem (CARDIZEM) 60 MG tablet Take 60 mg by mouth as needed.  . docusate sodium (COLACE) 100 MG capsule Take 1 capsule (100 mg total) by mouth daily. (Patient taking differently: Take 200 mg by mouth daily as needed for mild constipation. )  . famotidine (PEPCID) 20 MG tablet Take 20 mg by mouth at bedtime.   . furosemide (LASIX) 20 MG tablet Take 1 tablet by mouth daily, may take second dose if needed for excess edema (Patient taking differently: Take 20  mg by mouth in the morning and at bedtime. )  . gabapentin (NEURONTIN) 300 MG capsule TAKE 1 CAPSULE BY MOUTH UP  TO 4 TIMES DAILY (Patient taking differently: Take 300 mg by mouth every 6 (six) hours. )  . lactulose, encephalopathy, (GENERLAC) 10 GM/15ML SOLN TAKE 15 ML BY MOUTH TWICE DAILY AS NEEDED (Patient taking differently: Take 10 g by mouth See admin instructions. Take 10 grams by mouth once a day and an additional 10 grams once daily, as needed to move the bowels)  . levothyroxine (SYNTHROID, LEVOTHROID) 75 MCG tablet Take 75 mcg by mouth daily before breakfast.   . linaclotide (LINZESS) 145 MCG CAPS capsule Take 1 capsule (145 mcg total) by mouth daily before breakfast.  . melatonin 5 MG TABS Take 5 mg by mouth at bedtime.   . midodrine (PROAMATINE) 5 MG tablet Take 1 tablet (5 mg total) by mouth 3 (three) times daily with meals. (Patient taking differently: Take 5 mg by mouth 2 (two) times daily as needed (for blood pressure). )  . Omega-3 Fatty Acids (FISH OIL PO) Take 1 capsule by mouth daily.  Marland Kitchen omeprazole (PRILOSEC) 20 MG capsule Take 20 mg by mouth daily before breakfast.   . ondansetron (ZOFRAN) 8 MG tablet Take 8 mg by mouth every morning.   Marland Kitchen oxybutynin (DITROPAN) 5 MG tablet Take 5 mg by mouth 3 (three) times daily as needed for bladder spasms.   Marland Kitchen psyllium (METAMUCIL SMOOTH TEXTURE) 28 % packet Take 1 packet by mouth every evening.   . simvastatin (ZOCOR) 10 MG tablet Take 10 mg by mouth every evening.   Marland Kitchen SYRINGE/NEEDLE, DISP, 1 ML (BD LUER-LOK SYRINGE) 25G X 5/8" 1 ML MISC Use as directed to inject  Acthar  . Tamsulosin HCl (FLOMAX) 0.4 MG CAPS Take 0.4 mg by mouth in the morning.   . warfarin (COUMADIN) 5 MG tablet Take 1-1.5 tablets (5-7.5 mg total) by mouth See admin instructions. Take 5 mg by mouth in the late evening on Sun/Tues/Wed/Thurs/Fri/Sat and 7.5 mg on Mondays (Patient taking differently: Take 2.5-5 mg by mouth See admin instructions. Take 1 tablet (5 mg totally) by  mouth in the late evening on Sun/Tues/Wed/Thurs/Sat. Except 0.5 tablet ( 2.5 mg totally) by mouth on Monday, Friday)   No current facility-administered medications for this visit. (Other)      REVIEW OF SYSTEMS: ROS    Positive for: Neurological, Musculoskeletal, Cardiovascular, Eyes, Psychiatric   Negative for: Constitutional, Gastrointestinal, Skin, Genitourinary, HENT, Endocrine, Respiratory, Allergic/Imm, Heme/Lymph   Last edited by Doneen Poisson on 08/26/2020  9:06 AM. (History)       ALLERGIES Allergies  Allergen Reactions  . Chlorhexidine  Hives and Other (See Comments)    And, sores appear where applied  . Hydrocodone Nausea And Vomiting and Other (See Comments)    Hydrocodone causes vomiting  . Oxycodone Nausea And Vomiting and Other (See Comments)    Oral oxycodone causes vomiting  . Zoloft [Sertraline] Hives, Swelling and Rash    PAST MEDICAL HISTORY Past Medical History:  Diagnosis Date  . Abnormality of gait 03/01/2015  . Anxiety   . Arthritis    "knees" (01/19/2016)  . Asthma    as a child   . Back pain   . Cancer (HCC)    / RENAL CELL CARCINOMA - GETS CT SCAN EVERY 6 MONTHS TO WATCH   . CHF (congestive heart failure) (HCC)   . Chronic pain    "nerve pain from the MS"  . Complex atypical endometrial hyperplasia   . Constipation   . Depression   . Diabetes mellitus without complication (HCC)   . DVT (deep venous thrombosis) (HCC) 1983   "RLE; may have just been phlebitis; it was before the age of dopplers"  . Dysrhythmia    afib   . 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 GERD (gastroesophageal reflux disease)   . HLD (hyperlipidemia)   . Hypertension   . Hypotension   . Hypothyroidism   . Joint pain   . Multiple sclerosis (HCC)    has had this may 1989-[place] reg doc  . OSA on CPAP    bipap machine - SLEEP APNEA WORSE SINCE COVID IN 1/21 PER PATIENT SETTING HAS INCREASED FROM 9 TO 30   .  Osteoarthritis   . PAF (paroxysmal atrial fibrillation) (HCC)    on coumadin; documented on monitor 06/2010  . Pneumonia 11/2011  . Sleep apnea    Past Surgical History:  Procedure Laterality Date  . ANTERIOR CERVICAL DECOMP/DISCECTOMY FUSION  01/2004  . CARDIAC CATHETERIZATION  06/22/2010   normal coronary arteries, PAF  . CATARACT EXTRACTION, BILATERAL Bilateral   . CHILD BIRTHS     X2  . DILATATION & CURETTAGE/HYSTEROSCOPY WITH MYOSURE N/A 01/15/2020   Procedure: DILATATION & CURETTAGE/HYSTEROSCOPY WITH MYOSURE;  Surgeon: Richardean Chimera, MD;  Location: [facility];  Service: Gynecology;  Laterality: N/A;  pt in wheelchair-has MS  . DILATION AND CURETTAGE OF UTERUS    . DILATION AND CURETTAGE OF UTERUS N/A 02/16/2020   Procedure: DILATATION AND CURETTAGE;  Surgeon: Carver Fila, MD;  Location: [facility];  Service: Gynecology;  Laterality: N/A;  NOT GENERAL -NO INTUBATION  . INFUSION PUMP IMPLANTATION  ~ 2009   baclofen infusion in lower abd  . INTRAUTERINE DEVICE (IUD) INSERTION N/A 02/16/2020   Procedure: INTRAUTERINE DEVICE (IUD) INSERTION - MIRENA;  Surgeon: Carver Fila, MD;  Location: [facility];  Service: Gynecology;  Laterality: N/A;  . PAIN PUMP REVISION N/A 07/29/2014   Procedure: Baclofen pump replacement;  Surgeon: Maeola Harman, MD;  Location: [facility];  Service: Neurosurgery;  Laterality: N/A;  Baclofen pump replacement  . TONSILLECTOMY AND ADENOIDECTOMY    . VARICOSE VEIN SURGERY  ~ 1968  . VENOUS ABLATION  12/16/2010   radiofreq ablation -Dr Lynnea Ferrier and [facility]  . WISDOM TOOTH EXTRACTION      FAMILY HISTORY Family History  Problem Relation Age of Onset  . Coronary artery disease Father        at age 61  . Hyperlipidemia Father   . Thyroid disease  Father   . Coronary artery disease Maternal Grandmother   . Depression Maternal Grandmother   . Cancer Paternal Grandmother        breast  . Depression Mother   . Sudden death Mother   . Anxiety disorder Mother   .  Alcoholism Son     SOCIAL HISTORY Social History   Tobacco Use  . Smoking status: Former Smoker    Packs/day: 0.50    Years: 7.00    Pack years: 3.50    Types: Cigarettes    Quit date: 08/21/1976    Years since quitting: 44.0  . Smokeless tobacco: Never Used  Vaping Use  . Vaping Use: Never used  Substance Use Topics  . Alcohol use: Not Currently  . Drug use: No         OPHTHALMIC EXAM:  Base Eye Exam    Visual Acuity (Snellen - Linear)      Right Left   Dist cc 20/70 -1 20/30 -1   Dist ph cc 20/60 +1 NI   Correction: Glasses       Tonometry (Tonopen, 9:00 AM)      Right Left   Pressure 12 13       Pupils      Dark Light Shape React APD   Right 3 2 Round Brisk 0   Left 3 2 Round Brisk 0       Visual Fields      Left Right    Full Full       Extraocular Movement      Right Left    Full Full       Neuro/Psych    Oriented x3: Yes   Mood/Affect: Normal       Dilation    Both eyes: 1.0% Mydriacyl, 2.5% Phenylephrine @ 9:00 AM        Slit Lamp and Fundus Exam    Slit Lamp Exam      Right Left   Lids/Lashes Dermatochalasis - upper lid Dermatochalasis - upper lid   Conjunctiva/Sclera White and quiet White and quiet   Cornea Trace Punctate epithelial erosions Trace Punctate epithelial erosions   Anterior Chamber No cell or flare, deep and clear No cell or flare, deep and clear   Iris Round and dilated Round and dilated   Lens PC IOL in good position with open PC PC IOL in good position with open PC   Vitreous Vitreous syneresis, no cell, Posterior vitreous detachment, vitreous condensations Vitreous syneresis, no cell, Posterior vitreous detachment       Fundus Exam      Right Left   Disc mild Pallor, temporal PPA mild Pallor, temporal PPA   C/D Ratio 0.5 0.6   Macula Flat, Blunted foveal reflex, RPE mottling, clumping and atrophy, blonde fundus Flat, Blunted foveal reflex, RPE mottling, clumping and atrophy, blonde fundus   Vessels attenuated,  mild tortuousity attenuated, mild tortuousity   Periphery Attached, blonde fundus, reticular degeneration, mild paving stone degeneration inferiorly, No heme  Attached, blonde fundus, paving stone degeneration inferiorly, reticular degeneration, No heme         Refraction    Wearing Rx      Sphere Cylinder Axis Add   Right -1.75 +1.25 019 +2.75   Left -1.25 +1.25 001 +2.75   Type: prog       Manifest Refraction      Sphere Cylinder Axis Dist VA   Right -2.00 +1.00 025 20/60-1   Left -1.25 +1.25 180  20/30-1          IMAGING AND PROCEDURES  Imaging and Procedures for 08/26/2020  OCT, Retina - OU - Both Eyes       Right Eye Quality was good. Central Foveal Thickness: 267. Progression has no prior data. Findings include normal foveal contour, no IRF, no SRF, myopic contour.   Left Eye Quality was good. Central Foveal Thickness: 260. Progression has no prior data. Findings include normal foveal contour, no IRF, no SRF, retinal drusen , myopic contour (Rare drusen).   Notes *Images captured and stored on drive  Diagnosis / Impression:  NFP, no IRF/SRF OU Myopic contour OU  Clinical management:  See below  Abbreviations: NFP - Normal foveal profile. CME - cystoid macular edema. PED - pigment epithelial detachment. IRF - intraretinal fluid. SRF - subretinal fluid. EZ - ellipsoid zone. ERM - epiretinal membrane. ORA - outer retinal atrophy. ORT - outer retinal tubulation. SRHM - subretinal hyper-reflective material. IRHM - intraretinal hyper-reflective material        Fluorescein Angiography Optos (Transit OD)       Right Eye   Progression has no prior data. Early phase findings include normal observations, staining. Mid/Late phase findings include staining (?patchy choroidal staining, no leakage or vasculitis ).   Left Eye   Progression has no prior data. Early phase findings include normal observations, staining. Mid/Late phase findings include staining, normal  observations (Patchy choroidal background staining, no leakage or vasculitis).   Notes **Images stored on drive**  Impression: Normal retinal vascular study -- no leakage/vasculitis OU Patchy choroidal background staining OU -- suspect mostly related to myopic degeneration                  ASSESSMENT/PLAN:    ICD-10-CM   1. 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 Retinal edema  H35.81 OCT, Retina - OU - Both Eyes  3. Uncomplicated degenerative myopia of both eyes  H44.23   4. Pseudophakia of both eyes  Z96.1   5. Hypertensive retinopathy of both eyes  H35.033 Fluorescein Angiography Optos (Transit OD)    1. Multiple Sclerosis  - history of primary chronic progressive since 1989  - had flare-up (lower ext weakness) in Dec 2020, was put in Rehab and contracted COVID -- now with long-haul symptoms  - pt of Dr. Manuella Ghazi -- referred here for Optos FA  - pt rescheduled appointment due to recent development of "1000s of specks in vision" for last several weeks, but pt states that symptoms improved as of today following Acthar injection yesterday  - pt has also been seen by Dr. Hassell Done in the past  - today, BCVA 20/60 OD, 20/30 OS  - OCT: NFP, no IRF/SRF OU  - FA 01.06.21 -- essentially normal OU: no retinal vascular leakage or vasculitis OU; mild patchy choroidal background staining OU -- likely related to history of myopic degeneration  - will discuss case and share images with Dr. Manuella Ghazi for review  - f/u here PRN per Dr. Manuella Ghazi  2. No retinal edema on exam or OCT  3. Myopic degeneration OU  - history of high myopia  - stable; no CNV  4. Pseudophakia OU  - s/p CE/IOL OU and YAG cap OU (Dr. Edilia Bo)  - IOLs in good position, doing well  - monitor   Ophthalmic Meds Ordered this visit:  No orders of the defined types were placed in this encounter.      Return if symptoms  worsen or fail to improve.  There are no Patient Instructions  on file for this visit.   Explained the diagnoses, plan, and follow up with the patient and they expressed understanding.  Patient expressed understanding of the importance of proper follow up care.   This document serves as a record of services personally performed by Gardiner Sleeper, MD, PhD. It was created on their behalf by Leonie Douglas, an ophthalmic technician. The creation of this record is the provider's dictation and/or activities during the visit.    Electronically signed by: Leonie Douglas COA, 08/26/20  11:02 AM   Gardiner Sleeper, M.D., Ph.D. Diseases & Surgery of the Retina and Vitreous [facility]  I have reviewed the above documentation for accuracy and completeness, and I agree with the above. Gardiner Sleeper, M.D., Ph.D. 08/26/20 11:02 AM   Abbreviations: M myopia (nearsighted); A astigmatism; H hyperopia (farsighted); P presbyopia; Mrx spectacle prescription;  CTL contact lenses; OD right eye; OS left eye; OU both eyes  XT exotropia; ET esotropia; PEK punctate epithelial keratitis; PEE punctate epithelial erosions; DES dry eye syndrome; MGD meibomian gland dysfunction; ATs artificial tears; PFAT's preservative free artificial tears; [place] nuclear sclerotic cataract; PSC posterior subcapsular cataract; ERM epi-retinal membrane; PVD posterior vitreous detachment; RD retinal detachment; DM diabetes mellitus; DR diabetic retinopathy; NPDR non-proliferative diabetic retinopathy; PDR proliferative diabetic retinopathy; CSME clinically significant macular edema; DME diabetic macular edema; dbh dot blot hemorrhages; CWS cotton wool spot; POAG primary open angle glaucoma; C/D cup-to-disc ratio; HVF humphrey visual field; GVF goldmann visual field; OCT optical coherence tomography; IOP intraocular pressure; BRVO Branch retinal vein occlusion; CRVO central retinal vein occlusion; CRAO central retinal artery occlusion; BRAO branch retinal artery occlusion; RT retinal tear;  SB scleral buckle; PPV pars plana vitrectomy; VH Vitreous hemorrhage; PRP panretinal laser photocoagulation; IVK intravitreal kenalog; VMT vitreomacular traction; MH Macular hole;  NVD neovascularization of the disc; NVE neovascularization elsewhere; AREDS age related eye disease study; ARMD age related macular degeneration; POAG primary open angle glaucoma; EBMD epithelial/anterior basement membrane dystrophy; ACIOL anterior chamber intraocular lens; IOL intraocular lens; PCIOL posterior chamber intraocular lens; Phaco/IOL phacoemulsification with intraocular lens placement; [place] photorefractive keratectomy; LASIK laser assisted in situ keratomileusis; HTN hypertension; DM diabetes mellitus; COPD chronic obstructive pulmonary disease

## 2020-08-25 NOTE — Telephone Encounter (Signed)
See message below: Dr. Anne Hahn only refills her baclofen pump.   I am not sure that she needs repeat testing since this is not a new symptom

## 2020-08-25 NOTE — Telephone Encounter (Signed)
Called pt.  She is having L hand finger numbness (hard to hold things, both hands.  She had previous [place] [facility] thinking to do again if that or MS?? This was going on previously did not mention in the appt. 07-28-20.  Has appt 1/20 with Dr. Anne Hahn for baclofen pump refill.

## 2020-08-25 NOTE — Telephone Encounter (Signed)
Pt would like another NCS/EMG on her wrist/arm to see if she has MS or if it is carpel tunnel

## 2020-08-26 ENCOUNTER — Ambulatory Visit (INDEPENDENT_AMBULATORY_CARE_PROVIDER_SITE_OTHER): Payer: Medicare Other | Admitting: Ophthalmology

## 2020-08-26 ENCOUNTER — Encounter (INDEPENDENT_AMBULATORY_CARE_PROVIDER_SITE_OTHER): Payer: Self-pay | Admitting: Ophthalmology

## 2020-08-26 ENCOUNTER — Other Ambulatory Visit: Payer: Self-pay

## 2020-08-26 DIAGNOSIS — G35 Multiple sclerosis: Secondary | ICD-10-CM

## 2020-08-26 DIAGNOSIS — H3581 Retinal edema: Secondary | ICD-10-CM | POA: Diagnosis not present

## 2020-08-26 DIAGNOSIS — H4423 Degenerative myopia, bilateral: Secondary | ICD-10-CM | POA: Diagnosis not present

## 2020-08-26 DIAGNOSIS — Z961 Presence of intraocular lens: Secondary | ICD-10-CM

## 2020-08-26 DIAGNOSIS — G35B0 Primary progressive multiple sclerosis, unspecified: Secondary | ICD-10-CM

## 2020-08-26 DIAGNOSIS — H35033 Hypertensive retinopathy, bilateral: Secondary | ICD-10-CM | POA: Diagnosis not present

## 2020-08-26 NOTE — Addendum Note (Signed)
Addended by: Melvyn Novas on: 08/26/2020 04:51 PM   Modules accepted: Orders

## 2020-08-26 NOTE — Telephone Encounter (Signed)
I would recommend to look at her cervical spine to rule out an MS exacerbation, not a NCV/EMG-  CD

## 2020-08-27 ENCOUNTER — Encounter (INDEPENDENT_AMBULATORY_CARE_PROVIDER_SITE_OTHER): Payer: Self-pay

## 2020-08-27 ENCOUNTER — Encounter (INDEPENDENT_AMBULATORY_CARE_PROVIDER_SITE_OTHER): Payer: Medicare Other | Admitting: Ophthalmology

## 2020-08-30 ENCOUNTER — Telehealth: Payer: Self-pay | Admitting: Neurology

## 2020-08-30 ENCOUNTER — Ambulatory Visit (INDEPENDENT_AMBULATORY_CARE_PROVIDER_SITE_OTHER): Payer: Medicare Other | Admitting: Cardiology

## 2020-08-30 DIAGNOSIS — I509 Heart failure, unspecified: Secondary | ICD-10-CM | POA: Diagnosis not present

## 2020-08-30 DIAGNOSIS — Z7901 Long term (current) use of anticoagulants: Secondary | ICD-10-CM | POA: Diagnosis not present

## 2020-08-30 DIAGNOSIS — Z87440 Personal history of urinary (tract) infections: Secondary | ICD-10-CM | POA: Diagnosis not present

## 2020-08-30 DIAGNOSIS — G35 Multiple sclerosis: Secondary | ICD-10-CM | POA: Diagnosis not present

## 2020-08-30 DIAGNOSIS — J45909 Unspecified asthma, uncomplicated: Secondary | ICD-10-CM | POA: Diagnosis not present

## 2020-08-30 DIAGNOSIS — I110 Hypertensive heart disease with heart failure: Secondary | ICD-10-CM | POA: Diagnosis not present

## 2020-08-30 DIAGNOSIS — J9621 Acute and chronic respiratory failure with hypoxia: Secondary | ICD-10-CM | POA: Diagnosis not present

## 2020-08-30 LAB — POCT INR: INR: 2.8 (ref 2.0–3.0)

## 2020-08-30 NOTE — Telephone Encounter (Signed)
Medicare/bcbs supp order sent to GI. No auth they will reach out to the patient to schedule.  

## 2020-08-31 ENCOUNTER — Ambulatory Visit: Payer: Medicare Other

## 2020-08-31 ENCOUNTER — Telehealth: Payer: Self-pay | Admitting: *Deleted

## 2020-08-31 NOTE — Telephone Encounter (Signed)
Patient called and scheduled a follow up appt for March  

## 2020-08-31 NOTE — Telephone Encounter (Signed)
I called pt.  I relayed the recommendation for MRI per Dr. Brett Fairy to r/o MS. Pt has a lot of concerns about bipap/ MS / eyes .  She states she does not want to do the MRI at hospital in closed MRI (this due to her transferring.  She states that last MS attack she believed 08/02/20 went to rehab then got covid has had 6-7 UTI's.  See;s OT at home, notices more weakness in finger/hands, that why feels like [place]/EMG would tell her Ms or worsening CTS.  Her eyes seen opthamology (bugs in periphery).  Have r/o cornea, retina.  Takes acthar injection and notes this is better.  ? Is MS related.  I told her that the appt with MM/NP is 16min VV, she needs in office appt 30 min with NP or Dr. Brett Fairy.  I placed on my list for any cancellations.  She appreciated this.  Relay to [place] for an cancellations on CD/MD schedule.

## 2020-08-31 NOTE — Telephone Encounter (Signed)
Pt called,  Imaging called me to schedule a cervical  Spine MRI ordered by Dr. Brett Fairy. Would like a call from the nurse to discuss why I need an MRI.

## 2020-09-02 NOTE — Telephone Encounter (Signed)
[facility] is not able to do the MRI due to she is in a wheelchair and she needs a lift.   I have her scheduled at [facility] for 09/20/20 to arrive at 8:30 AM. That is the first available that they have with everything that is going on.  I spoke with the patient she does not understand why she needs to get this MRI done because she stated what is the difference is it going to make if there is a new lesion or not and what kind of treatment would need to be done. She stated she had MRI's done in December 2020.

## 2020-09-02 NOTE — Telephone Encounter (Signed)
Getting the scan is the patient's discretion.  Getting scan will help rule out if her symptoms are because of MS.  Certainly if her symptoms are not severe and she would refuse any type of intervention pending what the MRI showed then she does not have to complete it.

## 2020-09-02 NOTE — Telephone Encounter (Signed)
I spoke to pt yesterday and was sent to MM/NP.

## 2020-09-03 DIAGNOSIS — N3000 Acute cystitis without hematuria: Secondary | ICD-10-CM | POA: Diagnosis not present

## 2020-09-04 DIAGNOSIS — J9621 Acute and chronic respiratory failure with hypoxia: Secondary | ICD-10-CM | POA: Diagnosis not present

## 2020-09-04 DIAGNOSIS — Z87440 Personal history of urinary (tract) infections: Secondary | ICD-10-CM | POA: Diagnosis not present

## 2020-09-04 DIAGNOSIS — G35 Multiple sclerosis: Secondary | ICD-10-CM | POA: Diagnosis not present

## 2020-09-04 DIAGNOSIS — I509 Heart failure, unspecified: Secondary | ICD-10-CM | POA: Diagnosis not present

## 2020-09-04 DIAGNOSIS — J45909 Unspecified asthma, uncomplicated: Secondary | ICD-10-CM | POA: Diagnosis not present

## 2020-09-04 DIAGNOSIS — I110 Hypertensive heart disease with heart failure: Secondary | ICD-10-CM | POA: Diagnosis not present

## 2020-09-05 ENCOUNTER — Encounter (HOSPITAL_BASED_OUTPATIENT_CLINIC_OR_DEPARTMENT_OTHER): Payer: Medicare Other | Admitting: Pulmonary Disease

## 2020-09-06 ENCOUNTER — Encounter (INDEPENDENT_AMBULATORY_CARE_PROVIDER_SITE_OTHER): Payer: Medicare Other | Admitting: Ophthalmology

## 2020-09-07 ENCOUNTER — Telehealth: Payer: Self-pay | Admitting: Adult Health

## 2020-09-07 ENCOUNTER — Ambulatory Visit: Payer: Self-pay | Admitting: Neurology

## 2020-09-07 NOTE — Telephone Encounter (Signed)
Pt has called to report she will not be able to come today and she is waiting to see if she can get a ride for tomorrow.  Pt would like to know how many baclofen pills she can take.  Pt would also like to know if she can take a acthar shot, please call.

## 2020-09-07 NOTE — Telephone Encounter (Signed)
I understand that she couldn't come in, no worries.  Yes we can increase baclofen to TID. She can continue ACTHAR ., CD

## 2020-09-07 NOTE — Telephone Encounter (Signed)
Pt called stating that her Baclofen pump has stopped working and she is in a lot of pain and is having muscle spasms. Pt also states that her tempeture is slightly elevated. Please advise.

## 2020-09-07 NOTE — Telephone Encounter (Signed)
Called the patient and advised that Dr Brett Fairy is ok with her increasing the baclofen to TID in meantime and she is also ok with her taking acthar. Pt is still unsure if she will have a ride to make the apt tomorrow but states that will be in touch as soon as she hears back.

## 2020-09-07 NOTE — Telephone Encounter (Signed)
Pt has called to report that she has no way of making the appointment on tomorrow but she will keep her Thurs appointment and her upcoming appointment with Dr Brett Fairy in place.

## 2020-09-07 NOTE — Telephone Encounter (Signed)
I have called the patient back and advised that Dr [name] would work the patient in today to be seen and address her pump. Pt has to contact her transporter to see if they can bring her in. If so I will place the patient on Dr [name] schedule as a add in at 4 pm. If not pt made aware that was a opening with Dr [name] tomorrow at 3 pm. I have placed that slot on hold to have her seen at that time.

## 2020-09-07 NOTE — Telephone Encounter (Signed)
Called the patient in regards to having her seen earlier with Dr Brett Fairy. Pt thought I was returning the call she made today in regards to her baclofen pump. The patient states that she can barely move, having muscle spasms and her normal body temp is about 97.4. she is running a 99.5 temp. She is on the schedule for the 20 th with Dr [name] but would like a call from Dr [name]. She states that with her baclofen pump not working it is extremely difficult for her to move. She is asking if there is something else that can be done in the meantime. advised I would pass this message along to her and and see what he may recommend. For now I advised to keep the upcoming apt with Dr [name] on the 20 th and I have also added her in to be seen sooner with Dr Brett Fairy on Monday 1/24. Pt was appreciative.

## 2020-09-07 NOTE — Telephone Encounter (Signed)
Can have the patient come in this afternoon, I will recheck the baclofen pump, can potentially get blood work, urinalysis as well.  Can do a readjustment on the pump settings.

## 2020-09-07 NOTE — Telephone Encounter (Signed)
Called the patient back and advised there was a opening on 1/24 with Dr Brett Fairy. I was able to get her on the schedule for this. The patient has other concerns that she has called about in regards to her baclofen pump no longer working. She is wanting to make sure that Dr [name] is aware. She is on the schedule to have the pump evaluated on 20 th with Dr [name]. She is wanting a call back from nurse or Dr [name] about what else should or can be done.

## 2020-09-07 NOTE — Telephone Encounter (Signed)
Pt has appt 09-09-20 with Dr. [name] for baclofen pump refill then 09-13-20 with Dr. Brett Fairy.

## 2020-09-08 ENCOUNTER — Ambulatory Visit (INDEPENDENT_AMBULATORY_CARE_PROVIDER_SITE_OTHER): Payer: Medicare Other | Admitting: Pharmacist

## 2020-09-08 DIAGNOSIS — J45909 Unspecified asthma, uncomplicated: Secondary | ICD-10-CM | POA: Diagnosis not present

## 2020-09-08 DIAGNOSIS — I110 Hypertensive heart disease with heart failure: Secondary | ICD-10-CM | POA: Diagnosis not present

## 2020-09-08 DIAGNOSIS — I4819 Other persistent atrial fibrillation: Secondary | ICD-10-CM | POA: Diagnosis not present

## 2020-09-08 DIAGNOSIS — Z7901 Long term (current) use of anticoagulants: Secondary | ICD-10-CM | POA: Diagnosis not present

## 2020-09-08 DIAGNOSIS — J9621 Acute and chronic respiratory failure with hypoxia: Secondary | ICD-10-CM | POA: Diagnosis not present

## 2020-09-08 DIAGNOSIS — Z87440 Personal history of urinary (tract) infections: Secondary | ICD-10-CM | POA: Diagnosis not present

## 2020-09-08 DIAGNOSIS — I509 Heart failure, unspecified: Secondary | ICD-10-CM | POA: Diagnosis not present

## 2020-09-08 DIAGNOSIS — G35 Multiple sclerosis: Secondary | ICD-10-CM | POA: Diagnosis not present

## 2020-09-08 LAB — POCT INR: INR: 2.3 (ref 2.0–3.0)

## 2020-09-09 ENCOUNTER — Ambulatory Visit (INDEPENDENT_AMBULATORY_CARE_PROVIDER_SITE_OTHER): Payer: Medicare Other | Admitting: Neurology

## 2020-09-09 ENCOUNTER — Encounter: Payer: Self-pay | Admitting: Neurology

## 2020-09-09 VITALS — BP 114/51 | HR 97

## 2020-09-09 DIAGNOSIS — G35 Multiple sclerosis: Secondary | ICD-10-CM

## 2020-09-09 DIAGNOSIS — M62838 Other muscle spasm: Secondary | ICD-10-CM

## 2020-09-09 MED ORDER — BACLOFEN 40 MG/20ML IT SOLN
80.0000 mg | Freq: Once | INTRATHECAL | Status: AC
  Administered 2020-09-09: 80 mg via INTRATHECAL

## 2020-09-09 NOTE — Procedures (Signed)
     History:  Toni [name] is a 75 year old patient with a history of multiple sclerosis with a spastic paraparesis.  The patient recently has had increased abdominal spasms and spasticity within the last 3 to 4 days.  She is now on oral baclofen, she wishes to go up on the baclofen pump settings.   Baclofen pump refill note  The baclofen pump site was cleaned with Betadine solution. A 21-gauge needle was inserted into the pump port site. Approximately 10 cc of residual baclofen was removed, the pump indicates a 3.5 cc residual. 40 cc of replacement baclofen was placed into the pump at 2000 mcg/cc concentration.  The pump was reprogrammed for the following settings: The pump was reprogrammed and increased by 6% on a simple continuous rate.  The rate went from 849.4 mcg/day to a rate of 899.7 mcg/day.  The alarm volume is set at 1.5 cc. The next alarm date is December 03, 2020.  The patient tolerated the procedure well. There were no complications of the above procedure.  The [place] number is [phone] The baclofen expiration date is June 2023. The baclofen lot number is [id number].

## 2020-09-09 NOTE — Progress Notes (Signed)
Please refer to baclofen pump refill note.

## 2020-09-11 DIAGNOSIS — E039 Hypothyroidism, unspecified: Secondary | ICD-10-CM | POA: Diagnosis not present

## 2020-09-11 DIAGNOSIS — I110 Hypertensive heart disease with heart failure: Secondary | ICD-10-CM | POA: Diagnosis not present

## 2020-09-11 DIAGNOSIS — I872 Venous insufficiency (chronic) (peripheral): Secondary | ICD-10-CM | POA: Diagnosis not present

## 2020-09-11 DIAGNOSIS — K219 Gastro-esophageal reflux disease without esophagitis: Secondary | ICD-10-CM | POA: Diagnosis not present

## 2020-09-11 DIAGNOSIS — C649 Malignant neoplasm of unspecified kidney, except renal pelvis: Secondary | ICD-10-CM | POA: Diagnosis not present

## 2020-09-11 DIAGNOSIS — F419 Anxiety disorder, unspecified: Secondary | ICD-10-CM | POA: Diagnosis not present

## 2020-09-11 DIAGNOSIS — J45909 Unspecified asthma, uncomplicated: Secondary | ICD-10-CM | POA: Diagnosis not present

## 2020-09-11 DIAGNOSIS — E785 Hyperlipidemia, unspecified: Secondary | ICD-10-CM | POA: Diagnosis not present

## 2020-09-11 DIAGNOSIS — Z6832 Body mass index (BMI) 32.0-32.9, adult: Secondary | ICD-10-CM | POA: Diagnosis not present

## 2020-09-11 DIAGNOSIS — Z87440 Personal history of urinary (tract) infections: Secondary | ICD-10-CM | POA: Diagnosis not present

## 2020-09-11 DIAGNOSIS — K5904 Chronic idiopathic constipation: Secondary | ICD-10-CM | POA: Diagnosis not present

## 2020-09-11 DIAGNOSIS — I951 Orthostatic hypotension: Secondary | ICD-10-CM | POA: Diagnosis not present

## 2020-09-11 DIAGNOSIS — I509 Heart failure, unspecified: Secondary | ICD-10-CM | POA: Diagnosis not present

## 2020-09-11 DIAGNOSIS — F32A Depression, unspecified: Secondary | ICD-10-CM | POA: Diagnosis not present

## 2020-09-11 DIAGNOSIS — I4821 Permanent atrial fibrillation: Secondary | ICD-10-CM | POA: Diagnosis not present

## 2020-09-11 DIAGNOSIS — E669 Obesity, unspecified: Secondary | ICD-10-CM | POA: Diagnosis not present

## 2020-09-11 DIAGNOSIS — M170 Bilateral primary osteoarthritis of knee: Secondary | ICD-10-CM | POA: Diagnosis not present

## 2020-09-11 DIAGNOSIS — G35 Multiple sclerosis: Secondary | ICD-10-CM | POA: Diagnosis not present

## 2020-09-11 DIAGNOSIS — J9621 Acute and chronic respiratory failure with hypoxia: Secondary | ICD-10-CM | POA: Diagnosis not present

## 2020-09-11 DIAGNOSIS — I8390 Asymptomatic varicose veins of unspecified lower extremity: Secondary | ICD-10-CM | POA: Diagnosis not present

## 2020-09-11 DIAGNOSIS — G4733 Obstructive sleep apnea (adult) (pediatric): Secondary | ICD-10-CM | POA: Diagnosis not present

## 2020-09-11 DIAGNOSIS — E1151 Type 2 diabetes mellitus with diabetic peripheral angiopathy without gangrene: Secondary | ICD-10-CM | POA: Diagnosis not present

## 2020-09-11 DIAGNOSIS — Z8616 Personal history of COVID-19: Secondary | ICD-10-CM | POA: Diagnosis not present

## 2020-09-11 DIAGNOSIS — N39490 Overflow incontinence: Secondary | ICD-10-CM | POA: Diagnosis not present

## 2020-09-11 DIAGNOSIS — I480 Paroxysmal atrial fibrillation: Secondary | ICD-10-CM | POA: Diagnosis not present

## 2020-09-12 ENCOUNTER — Other Ambulatory Visit: Payer: Self-pay | Admitting: Internal Medicine

## 2020-09-13 ENCOUNTER — Ambulatory Visit (INDEPENDENT_AMBULATORY_CARE_PROVIDER_SITE_OTHER): Payer: Medicare Other | Admitting: Neurology

## 2020-09-13 ENCOUNTER — Telehealth: Payer: Self-pay | Admitting: Neurology

## 2020-09-13 ENCOUNTER — Encounter: Payer: Self-pay | Admitting: Neurology

## 2020-09-13 ENCOUNTER — Other Ambulatory Visit: Payer: Self-pay | Admitting: Neurology

## 2020-09-13 VITALS — BP 114/67 | HR 83 | Ht 62.0 in

## 2020-09-13 DIAGNOSIS — G35 Multiple sclerosis: Secondary | ICD-10-CM | POA: Diagnosis not present

## 2020-09-13 DIAGNOSIS — U099 Post covid-19 condition, unspecified: Secondary | ICD-10-CM

## 2020-09-13 DIAGNOSIS — J9601 Acute respiratory failure with hypoxia: Secondary | ICD-10-CM | POA: Insufficient documentation

## 2020-09-13 DIAGNOSIS — R0609 Other forms of dyspnea: Secondary | ICD-10-CM | POA: Diagnosis not present

## 2020-09-13 DIAGNOSIS — G4737 Central sleep apnea in conditions classified elsewhere: Secondary | ICD-10-CM

## 2020-09-13 DIAGNOSIS — E240 Pituitary-dependent Cushing's disease: Secondary | ICD-10-CM | POA: Insufficient documentation

## 2020-09-13 DIAGNOSIS — I5032 Chronic diastolic (congestive) heart failure: Secondary | ICD-10-CM | POA: Insufficient documentation

## 2020-09-13 DIAGNOSIS — E119 Type 2 diabetes mellitus without complications: Secondary | ICD-10-CM | POA: Diagnosis not present

## 2020-09-13 DIAGNOSIS — I4891 Unspecified atrial fibrillation: Secondary | ICD-10-CM

## 2020-09-13 DIAGNOSIS — G4733 Obstructive sleep apnea (adult) (pediatric): Secondary | ICD-10-CM | POA: Insufficient documentation

## 2020-09-13 DIAGNOSIS — I5022 Chronic systolic (congestive) heart failure: Secondary | ICD-10-CM | POA: Insufficient documentation

## 2020-09-13 DIAGNOSIS — G4734 Idiopathic sleep related nonobstructive alveolar hypoventilation: Secondary | ICD-10-CM | POA: Diagnosis not present

## 2020-09-13 DIAGNOSIS — Z6835 Body mass index (BMI) 35.0-35.9, adult: Secondary | ICD-10-CM

## 2020-09-13 HISTORY — DX: Other forms of dyspnea: R06.09

## 2020-09-13 MED ORDER — BUPROPION HCL ER (XL) 150 MG PO TB24: 150.0000 mg | ORAL_TABLET | Freq: Every day | ORAL | 5 refills | Status: DC

## 2020-09-13 MED ORDER — ACTHAR 80 UNIT/ML IJ GEL: INTRAMUSCULAR | 5 refills | Status: DC

## 2020-09-13 MED ORDER — ESCITALOPRAM OXALATE 10 MG PO TABS: 10.0000 mg | ORAL_TABLET | Freq: Every day | ORAL | 5 refills | Status: DC

## 2020-09-13 NOTE — Telephone Encounter (Signed)
error 

## 2020-09-13 NOTE — Telephone Encounter (Signed)
This is the MRI that was previously ordered at a different time and the patient did not want to move forward with. There is no new orders for MRI's for the patient and if she does not want to complete MRI that is ok. (I will reach out to the patient and inform her of this as soon as I can but if she calls back please advise)

## 2020-09-13 NOTE — Progress Notes (Signed)
PATIENT: Toni [name] DOB: [birth year]  REASON FOR VISIT: follow up HISTORY FROM: patient  HISTORY OF PRESENT ILLNESS: Today 09/13/20:  Toni [name] is a 75 year old female with a history of obstructive sleep apnea on BiPAP and multiple sclerosis.  She returns today for follow-up on 09-13-2020.  We are discussing her high AHi on BiPAP, also she is very compliant with aditional 3 liters of oxygen, she will have to re qualify for oxygen , I ned her to come in for in lab study , but she is n need of a medical bed. All this has beeen hampering her progress.  Since she contracted COVID in a rehab facility she has lost weight, has SOB more often, and she is not doing well with the PAP mask. She feels she needs more ACTHAR shots.  These halp her still.  She has had a baclofen pump , but there was enough baclofen in the pump, and she remains in pain.  She has fluid retention- Dr Debara Pickett, CHF related anasarca. belly swelling and spasms in abdomen.  She is on diuretics and potassium.  She has no bladder retention, and her CUREwick is helpful.  OT wants a new NCV. She had thenar atphroy - left hand. Dr [name] stated this is Carpal tunnel- her hand surgeon wants documentation.    Multiple sclerosis: She continues on Acthar.   She continues to have weakness in the right lower extremity.  She states that she has a hard time pivoting when ambulating.  She is in a wheelchair today.  She does feel that her memory has changed over time.  She states that if she does not make a list she typically does not remember.  She has a caregiver as well as her husband that helps her.  Obstructive sleep apnea on BiPAP: Download indicates that she use her machine nightly for compliance of 100%.  She use her machine greater than 4 hours each night.  On average she uses it 8 hours and 50 minutes.  Her residual AHI is 21.5 on 16/11 centimeters of water.  She reports that she had an overnight pulse oximetry test with her  pulmonologist that showed desaturations despite being on 3 L of oxygen at night.  She feels that her BiPAP pressure is too low.  She returns today for an evaluation.  [name] who prefers to be called Toni [name], is a 75 year old Caucasian female with a longstanding history of multiple sclerosis.  Her primary care physician is Dr. Prince Solian, MD PhD, her cardiologist is Dr. Debara Pickett and her new pulmonologist is Dr. Ander Slade.  [name], she has a baclofen pump to deal with the spasticity remaining she had further progression after she contracted Covid, she still feels that she has not recovered her baseline which is very likely.  The patient has atrial fibrillation for which she is followed by Dr. Debara Pickett, steroid infusions that be used in the past to limit an MS exacerbation had also contrast contributed to atrophic in onset.  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  BiPAP was no longer working and especially did not prevent hypoxemia at night.  She is using BiPAP present but it has produced an enormous oral airleak and she cannot tolerate a full facemask, he finally were able to convince her pulmonologist that she needs oxygen at 3 L either bled into this BiPAP device  or to be used by nasal cannula .  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  Oncology gynecology did not find other malignant cells outside of the affected polyp.  She is now wearing an IUD that has progesterone as a Depo.  Her baclofen pump will be refilled later this month by Dr. [name].  She has been taking ACTH to prevent relapses which has been kinder to her heart rate.  The baclofen pump seems to be malfunctioning and she is addressing this with Dr. [name].  She also developed constant  bladder leaking and incontinence issues she has had a Foley catheter placed unfortunately there were 250 cc of residual urine that she could not spontaneously void.  With her Foley catheter however she contracted a UTI and she is already on the third round of Macrobid.  She was diagnosed with renal cell carcinoma 5 years ago which has been status quo and is followed yearly by urology.  Vital signs today were given by her caregiver and daughter [name].  Blood pressure was 112/66 mmHg, heart rate was 74 and regular.  Her body temperature was 98.6 F, oxygen saturation was 97%.   REVIEW OF SYSTEMS: Out of a complete 14 system review of symptoms, the patient complains only of the following symptoms, and all other reviewed systems are negative.  See HPI  ALLERGIES: Allergies  Allergen Reactions  . Chlorhexidine Hives and Other (See Comments)    And, sores appear where applied  . Hydrocodone Nausea And Vomiting and Other (See Comments)    Hydrocodone causes vomiting  . Oxycodone Nausea And Vomiting and Other (See Comments)    Oral oxycodone causes vomiting  . Zoloft [Sertraline] Hives, Swelling and Rash    HOME MEDICATIONS: Outpatient Medications Prior to Visit  Medication Sig Dispense Refill  . acetaminophen (TYLENOL) 500 MG tablet Take 500-1,000 mg by mouth every 6 (six) hours as needed for fever or headache (or pain).    . ACTHAR 80 UNIT/ML injectable gel INJECT 1ML (80 UNITS TOTAL) SUBCUTANEOUSLY EVERY OTHER  DAY FOR 10 DAYS EACH MONTH  (DISCARD 28 DAYS AFTER  FIRST USE) 5 mL 5  . ALPRAZolam (XANAX) 0.5 MG tablet TAKE 1 TABLET BY MOUTH 3  TIMES DAILY AS NEEDED FOR  ANXIETY OR SLEEP 90 tablet 0  . amoxicillin (AMOXIL) 500 MG capsule Take 500 mg by mouth 3 (three) times daily.    Marland Kitchen amoxicillin (AMOXIL) 500 MG tablet Take 500 mg by mouth in the morning, at noon, and at bedtime. X7 days.    . baclofen (LIORESAL) 20 MG tablet Take 1 tablet (20 mg total) by mouth 2 (two) times daily as  needed for muscle spasms. 180 tablet 4  . BIOTIN PO Take 1 tablet by mouth daily.    Marland Kitchen buPROPion (WELLBUTRIN XL) 300 MG 24 hr tablet Take 1 tablet (300 mg total) by mouth daily. 30 tablet 0  . Calcium Carb-Cholecalciferol (CALCIUM + D3 PO) Take 2 tablets by mouth in the morning and at bedtime.    . cetirizine (ZYRTEC) 10 MG tablet Take 10 mg by mouth daily as needed for allergies.     . Cholecalciferol (VITAMIN D3) 50 MCG (2000 UT) TABS Take 2,000 Units by mouth in the morning and at bedtime.    . Coenzyme Q10 (CO Q-10 PO) Take 1 capsule by mouth daily.     Marland Kitchen  diltiazem (CARDIZEM) 60 MG tablet Take 60 mg by mouth as needed.    . docusate sodium (COLACE) 100 MG capsule Take 1 capsule (100 mg total) by mouth daily. (Patient taking differently: Take 200 mg by mouth daily as needed for mild constipation.) 10 capsule 0  . famotidine (PEPCID) 20 MG tablet Take 20 mg by mouth at bedtime.   6  . furosemide (LASIX) 20 MG tablet Take 1 tablet by mouth daily, may take second dose if needed for excess edema (Patient taking differently: Take by mouth. 2 tablets in the morning and 3 tablets at night.) 180 tablet 3  . gabapentin (NEURONTIN) 300 MG capsule TAKE 1 CAPSULE BY MOUTH UP  TO 4 TIMES DAILY (Patient taking differently: Take 300 mg by mouth every 6 (six) hours.) 360 capsule 3  . lactulose, encephalopathy, (GENERLAC) 10 GM/15ML SOLN TAKE 15 ML BY MOUTH TWICE DAILY AS NEEDED (Patient taking differently: Take 10 g by mouth See admin instructions. Take 10 grams by mouth once a day and an additional 10 grams once daily, as needed to move the bowels) 2717 mL 1  . levothyroxine (SYNTHROID, LEVOTHROID) 75 MCG tablet Take 75 mcg by mouth daily before breakfast.     . linaclotide (LINZESS) 145 MCG CAPS capsule Take 1 capsule (145 mcg total) by mouth daily before breakfast. 90 capsule 3  . melatonin 5 MG TABS Take 5 mg by mouth at bedtime.    . midodrine (PROAMATINE) 5 MG tablet Take 1 tablet (5 mg total) by mouth 3  (three) times daily with meals. (Patient taking differently: Take 5 mg by mouth 2 (two) times daily as needed (for blood pressure). To raise blood pressure.) 270 tablet 3  . Omega-3 Fatty Acids (FISH OIL PO) Take 1 capsule by mouth daily.    Marland Kitchen omeprazole (PRILOSEC) 20 MG capsule Take 20 mg by mouth daily before breakfast.     . ondansetron (ZOFRAN) 8 MG tablet Take 8 mg by mouth every morning.     Marland Kitchen oxybutynin (DITROPAN) 5 MG tablet Take 5 mg by mouth 3 (three) times daily as needed for bladder spasms.     . potassium chloride SA (KLOR-CON) 20 MEQ tablet Take 40 mEq by mouth daily.    . psyllium (METAMUCIL SMOOTH TEXTURE) 28 % packet Take 1 packet by mouth every evening.     . simvastatin (ZOCOR) 10 MG tablet Take 10 mg by mouth every evening.     Marland Kitchen SYRINGE/NEEDLE, DISP, 1 ML (BD LUER-LOK SYRINGE) 25G X 5/8" 1 ML MISC Use as directed to inject  Acthar 10 each prn  . Tamsulosin HCl (FLOMAX) 0.4 MG CAPS Take 0.4 mg by mouth in the morning.    . warfarin (COUMADIN) 5 MG tablet TAKE 1/2 TO 1 TABLET BY  MOUTH DAILY AS DIRECTED BY  COUMADIN CLINIC 90 tablet 3  . diltiazem (CARDIZEM CD) 240 MG 24 hr capsule Take 1 capsule (240 mg total) by mouth daily. 30 capsule 3   No facility-administered medications prior to visit.    PAST MEDICAL HISTORY: Past Medical History:  Diagnosis Date  . Abnormality of gait 03/01/2015  . Anxiety   . Arthritis    "knees" (01/19/2016)  . Asthma    as a child   . Back pain   . Cancer ([place])    / RENAL CELL CARCINOMA - GETS CT SCAN EVERY 6 MONTHS TO WATCH   . CHF (congestive heart failure) ([place])   . Chronic pain    "  nerve pain from the MS"  . Complex atypical endometrial hyperplasia   . Constipation   . Depression   . Diabetes mellitus without complication ([place])   . DVT (deep venous thrombosis) ([place]) 1983   "RLE; may have just been phlebitis; it was before the age of dopplers"  . Dysrhythmia    afib   . Edema    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  L GSV' ablation of R GSV 2012  . GERD (gastroesophageal reflux disease)   . HLD (hyperlipidemia)   . Hypertension   . Hypotension   . Hypothyroidism   . Joint pain   . Multiple sclerosis ([place])    has had this may 1989-[facility] reg doc  . OSA on CPAP    bipap machine - SLEEP APNEA WORSE SINCE COVID IN 1/21 PER PATIENT SETTING HAS INCREASED FROM 9 TO 30   . Osteoarthritis   . PAF (paroxysmal atrial fibrillation) ([place])    on coumadin; documented on monitor 06/2010  . Pneumonia 11/2011  . Sleep apnea     PAST SURGICAL HISTORY: Past Surgical History:  Procedure Laterality Date  . ANTERIOR CERVICAL DECOMP/DISCECTOMY FUSION  01/2004  . CARDIAC CATHETERIZATION  06/22/2010   normal coronary arteries, PAF  . CATARACT EXTRACTION, BILATERAL Bilateral   . CHILD BIRTHS     X2  . DILATATION & CURETTAGE/HYSTEROSCOPY WITH MYOSURE N/A 01/15/2020   Procedure: DILATATION & CURETTAGE/HYSTEROSCOPY WITH MYOSURE;  Surgeon: Arvella Nigh, MD;  Location: [facility];  Service: Gynecology;  Laterality: N/A;  pt in wheelchair-has MS  . DILATION AND CURETTAGE OF UTERUS    . DILATION AND CURETTAGE OF UTERUS N/A 02/16/2020   Procedure: DILATATION AND CURETTAGE;  Surgeon: Lafonda Mosses, MD;  Location: [facility];  Service: Gynecology;  Laterality: N/A;  NOT GENERAL -NO INTUBATION  . INFUSION PUMP IMPLANTATION  ~ 2009   baclofen infusion in lower abd  . INTRAUTERINE DEVICE (IUD) INSERTION N/A 02/16/2020   Procedure: INTRAUTERINE DEVICE (IUD) INSERTION - MIRENA;  Surgeon: Lafonda Mosses, MD;  Location: [facility];  Service: Gynecology;  Laterality: N/A;  . PAIN PUMP REVISION N/A 07/29/2014   Procedure: Baclofen pump replacement;  Surgeon: Erline Levine, MD;  Location: [facility];  Service: Neurosurgery;  Laterality: N/A;  Baclofen pump replacement  . TONSILLECTOMY AND ADENOIDECTOMY    . VARICOSE VEIN SURGERY  ~ 1968  . VENOUS ABLATION  12/16/2010   radiofreq ablation -Dr Elisabeth Cara and [facility]  . WISDOM TOOTH EXTRACTION       FAMILY HISTORY: Family History  Problem Relation Age of Onset  . Coronary artery disease Father        at age 45  . Hyperlipidemia Father   . Thyroid disease Father   . Coronary artery disease Maternal Grandmother   . Depression Maternal Grandmother   . Cancer Paternal Grandmother        breast  . Depression Mother   . Sudden death Mother   . Anxiety disorder Mother   . Alcoholism Son     SOCIAL HISTORY: Social History   Socioeconomic History  . Marital status: Married    Spouse name: Toni [name]  . Number of children: 2  . Years of education: COLLEGE  . Highest education level: Not on file  Occupational History  . Occupation: RETIRED  Tobacco Use  . Smoking status: Former Smoker    Packs/day: 0.50    Years: 7.00    Pack years: 3.50    Types: Cigarettes  Quit date: 08/21/1976    Years since quitting: 44.0  . Smokeless tobacco: Never Used  Vaping Use  . Vaping Use: Never used  Substance and Sexual Activity  . Alcohol use: Not Currently  . Drug use: No  . Sexual activity: Not Currently  Other Topics Concern  . Not on file  Social History Narrative   Lives in [place] with Husband Toni [name] [phone], [phone]   Right handed   Drinks a little caffeine in coffee sometimes    Social Determinants of Health   Financial Resource Strain: Not on file  Food Insecurity: Not on file  Transportation Needs: Not on file  Physical Activity: Not on file  Stress: Not on file  Social Connections: Not on file  Intimate Partner Violence: Not on file      PHYSICAL EXAM  Vitals:   09/13/20 1305  BP: 114/67  Pulse: 83  Height: 5\' 2"  (1.575 m)   Body mass index is 32.56 kg/m.   No flowsheet data found. Montreal Cognitive Assessment  07/27/2020 10/04/2015 06/02/2015 03/30/2015  Visuospatial/ Executive (0/5) 5 4 5 5   Naming (0/3) 3 3 3 3   Attention: Read list of digits (0/2) 2 2 2 2   Attention: Read list of letters (0/1) 1 1 1 1   Attention: Serial 7 subtraction starting at 100  (0/3) 3 3 3 3   Language: Repeat phrase (0/2) 2 2 2 2   Language : Fluency (0/1) 1 1 1 1   Abstraction (0/2) 2 2 2 2   Delayed Recall (0/5) 4 5 5 5   Orientation (0/6) 6 6 6 6   Total 29 29 30 30   Adjusted Score (based on education) - 29 30 30      Generalized: Well developed, in no acute distress   Neurological examination  Mentation: Alert oriented to time, place, history taking. Follows all commands speech and language fluent Cranial nerve II-XII: Pupils were equal round reactive to light. Extraocular movements were full, visual field were full on confrontational test. Head turning and shoulder shrug  were normal and symmetric. Motor: The motor testing reveals 5 over 5 strength in all extremities with exception of the right lower extremity.  3 out of 5 strength. Sensory: Sensory testing is intact to soft touch on all 4 extremities. No evidence of extinction is noted.  Coordination: Cerebellar testing reveals good finger-nose-finger bilaterally good heel-to-shin on the left and able to complete with the right leg Gait and station: Patient is in a wheelchair Reflexes: Deep tendon reflexes are symmetric and normal bilaterally.   DIAGNOSTIC DATA (LABS, IMAGING, TESTING) - I reviewed patient records, labs, notes, testing and imaging myself where available.  Lab Results  Component Value Date   WBC 6.9 06/11/2020   HGB 11.3 (L) 06/11/2020   HCT 35.1 (L) 06/11/2020   MCV 98.0 06/11/2020   PLT 256 06/11/2020      Component Value Date/Time   NA 141 06/10/2020 0116   NA 145 (H) 03/19/2019 1517   K 4.2 06/10/2020 0116   CL 98 06/10/2020 0116   CO2 35 (H) 06/10/2020 0116   GLUCOSE 82 06/10/2020 0116   BUN 14 06/10/2020 0116   BUN 24 03/19/2019 1517   CREATININE 0.40 (L) 06/10/2020 0116   CALCIUM 8.5 (L) 06/10/2020 0116   PROT 4.8 (L) 06/05/2020 0035   PROT 5.5 (L) 03/19/2019 1517   ALBUMIN 2.6 (L) 06/05/2020 0035   ALBUMIN 3.7 03/19/2019 1517   AST 15 06/05/2020 0035   ALT 15  06/05/2020 0035   ALKPHOS  37 (L) 06/05/2020 0035   BILITOT 0.9 06/05/2020 0035   BILITOT 0.2 03/19/2019 1517   GFRNONAA >60 06/10/2020 0116   GFRAA >60 04/07/2020 0342   Lab Results  Component Value Date   CHOL 111 03/19/2019   HDL 57 03/19/2019   LDLCALC 43 03/19/2019   TRIG 53 03/19/2019   CHOLHDL 3.0 04/22/2010   Lab Results  Component Value Date   HGBA1C 4.9 06/05/2020   No results found for: VITAMINB12 Lab Results  Component Value Date   TSH 1.374 10/10/2019      ASSESSMENT AND PLAN 75 y.o. year old female  has a past medical history of Abnormality of gait (03/01/2015), Anxiety, Arthritis, Asthma, Back pain, Cancer ([facility]), CHF (congestive heart failure) ([place]), Chronic pain, Complex atypical endometrial hyperplasia, Constipation, Depression, Diabetes mellitus without complication ([place]), DVT (deep venous thrombosis) ([place]) (1983), Dysrhythmia, Edema, GERD (gastroesophageal reflux disease), HLD (hyperlipidemia), Hypertension, Hypotension, Hypothyroidism, Joint pain, Multiple sclerosis ([place]), OSA on CPAP, Osteoarthritis, PAF (paroxysmal atrial fibrillation) ([place]), Pneumonia (11/2011), and Sleep apnea. here with:  1.  Obstructive sleep apnea on BiPAP  --Good compliance --Residual AHI is elevated at 21.5 events an hour --We will increase pressure to 17/12.  We will repeat a download in 2 months if no improvement will proceed with BiPAP titration  2.  Multiple sclerosis  --Continue Acthar --Continue monitoring symptoms   Advised if symptoms worsen or she develops new symptoms she should let us know we will follow-up in 2 to 3 months for repeat sleep download   I spent 30 minutes of face-to-face and non-face-to-face time with patient.  This included previsit chart review, lab review, study review, order entry, electronic health record documentation, patient education.  Ward Givens, MSN, NP-C 09/13/2020, 1:30 PM [facility] [address] [place], [place] [address] [phone]     PATIENT: REALYNN KONJA DOB: February 04, 1946  REASON FOR VISIT: follow up MS, OSA, Spasms, / renal cell carcinoma - Dr. Ernst Bowler   HISTORY FROM: patient alone   HISTORY OF PRESENT ILLNESS:  Today 09/13/20, Rv: seeing Toni [name] after last Baclofen pump refill and after sleep study:  The patient endorsed the Epworth Sleepiness Scale at 12/24 points   The patient's weight 207 pounds with a height of 63 (inches), resulting in a BMI of 36.7 kg/m2. The patient's neck circumference measured 18 inches.  CURRENT MEDICATIONS: Xanax, Lioresal, Zyrtec, Cardizem, Flonase, Lasix, Neurontin, Synthroid, Bystolic, Prilosec, Zantac, Zocor, Flomax, Coumadin   PATIENT'S NAME:  Toni [name], Toni [name] DOB:      02/11/1946      MR#:    [id number]     DATE OF RECORDING: 08/12/2018 [facility] REFERRING M.D.:  Prince Solian MD Study Performed:  Split-Night Titration Study HISTORY:  Toni [name] is a 75 year old female MS patient and with complex sleep apnea on BIPAP   She today having lost again a significant amount of weight.  She follows Dr. Leafy Ro at the medical weight management clinic.  With her weight loss came also the needs to refit her CPAP mask.  She is using a BiPAP setting of 20/15 cmH2O and her residual AHI is 4.9/h the best for long time.  She does have minimal leaks which speaks for the good fit of her current mask.  She uses the machine 100% of the time of 7 hours and 2 minutes of average use.  I would not like to change any settings and also the patient reports that she has pressure marks and sometimes  almost bruising from her facial mask seems to be the best she has done in a long time.  Her Epworth sleepiness score also has improved /she only endorsed 12 out of 24 points and she endorsed 20 out of 63 points of the fatigue severity score.  I see her as much less fatigued, and more optimistic.  Overall stronger -no longer with home health- now with "[facility]" 3 times a  week. She now can stand long enough to pull up her pants. Her last labs were reviewed. She is in chronic atrial fibrillation- rate controlled on diltiazem 180 mg, BP is lower.    Patient returning for a BIPAP Titration sleep study scored at 4%, with a 1 hour Baseline study at the beginning.  POLYSOMNOGRAPHY IMPRESSION :   1. Complex Sleep Apnea (CSA) was not significantly improved by BiPAP .The oxygen level improved  2. Central Sleep Apnea   3. abnormal EKG- chronic a fib.  4. Fragmented sleep, with and without BiPAP.  RECOMMENDATIONS: Use of BIPAP is optional, it will not influence the cardiac arrhythmia once chronic. There was a marginal benefit with the use of PAP, especially for the hypoxemia. No obstructive apnea of significance. She continues to use BipAP- she can lower the pressure.  [name], M.D.   08-05-2018  Medical Director, [facility] at [facility]   Toni [name] reports that her heart rate has been slower and her blood pressure has been measurably lower, while she remains in chronic atrial fibrillation.  She is on a calcium channel blocker, and a beta-blocker.  She has used her BiPAP at the current settings of 20/15 centimeters since the results of the last sleep study did not yet lead to a result.  Problems.  She is currently using a pressure that I ordered at 9/5 cmH2O.  I also asked for 30-day download on those results for sleep study as quoted above and she has been showing interest in using the BiPAP just because it does not help her fatigue to have her oxygen levels elevated.  It has had a very marginal effect on the overall AHI.  Her AHI was 5.6/h and would not require necessarily positive airway therapy.   She endorsed today the fatigue severity score at only 19 points which is unusually low for her and her Epworth Sleepiness Scale at 7 points which is also lower than usual.   She has lost so much weight that she no longer applies to the risk of obesity hypoventilation and I  congratulated her for her extraordinary efforts given her limited exercise capacity.    History of present illness :Toni [name]  has been seen in this office for MS for 30 years, was diagnosed by Dr. Rosiland Oz in [phone]. Mr. Crispi had several days of severe spasms, feeling tight, and she was unable to transfer, to walk with her walker. At baseline she can walk 40-50 feet. She was asked to used acthar 2 days in a row, and it seems to help, she has 6 more to go. She did not feel any improvement after taking baclofen.  She has been successful in losing a lot of weight under Dr. Migdalia Dk medical weight management program, at that clinic she needs to stay and stand still on a special scale which allows the differentiation between total weight, water weight fluid and fat content.  She is not able right now to stand still enough to allow this machine to work.  However her weight loss has been so significant  that she has some loose skin now up to 18 her current weight is 218 pounds and this will certainly support her apnea treatment.  Orders the patient is here followed also for complex and central sleep apnea, the control had been important as she also developed atrial fibrillation followed by Dr. Debara Pickett.   12-19-2017, RV . Follow up visit with [name], better known as [name], established 75 year old Caucasian female patient with long-standing history of MS.  She just celebrated her 60th wedding anniversary with her husband.  The patient has been followed for a prescription pump and fill by Dr. [name] she has an appointment on April 11 and at that time her baclofen rate was increased to 799.5 mcg/day, for a day if she felt floppy and weak.  The very next day the dose was reduced to 700.3 mcg a day and since then she has been doing okay.  She still uses actor, she still follows with Dr. Leafy Ro at the medical weight management clinic.  Dr. Debara Pickett is her cardiologist, Dr. Dagmar Hait her primary care physician. The  patient most recently saw Dr. Leafy Ro she was unable to step up to the scale and told safely on.  Dr. Leafy Ro obtain a metabolic panel which showed hypokalemia we will review this in detail later on today's visit, she has been started on potassium supplements.   Date , MM: [name] is a 75 year old female with a history of multiple sclerosis and obstructive sleep apnea on BiPAP.   she returns today for follow-up.  Her BiPAP indicates that she use her machine 30 out of 30 days for compliance of 100%.  She use her machine greater than 4 hours 28 out of 30 days for compliance of 93%.  On average she uses her machine 7 hours and 14 minutes.  Her IPAP is 20 cm of water and EPAP pressure of 15 cm of water her residual AHI is 16.7.  She does not have a significant leak.  The patient reports that she has been seeing Dr. Leafy Ro for weight loss.  Reports that she is lost approximately 30 pounds.  She has an appointment this coming Wednesday.  The patient denies any new numbness or weakness does feel like her hands is getting gradually weaker.  She states that she does participate in physical therapy 1 time a week but then does her own exercises 3 times a week.  She uses a wheelchair primarily.  She reports that she has several more months of Betaseron left but once her prescription runs out she plans to stop this medication. She reports that taking Acthar works better for her. She reports that she has been on Copaxone and Avonex but never any of the oral medications. She returns today for an evaluation.  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 She has started on a BiPAP setting of 20/15 cm water and has been 100% compliance for the last 30 days with an average of 7 hours and 42 minutes. The residual AHI is still 13.9 area [facility] did call her and told her that there with 3 days consecutively during which  her AHI was markedly reduced.This occurred during May, and it related to Toni [name] being given at double dose of diuretics , while she was at [facility] rehab. It was most nights after a high diuretic was given that she slept the best and happily  with congestive central apnea. In the meantime, her cardiologist, Dr. Debara Pickett, has increased Lasix from 60-80 mg daily but she has not had the same effect as she had at 100 mg daily. She is continuously in a fib now. No ablation as long as apnea is not treated.   Dr. Leafy Ro did an extensive lab panel,  Good HB a1c. She was low on protein- and she sees a nutritionist. Toni [name] is now cooking for her. Lots of chicken and vegetables- and she enjoys the food.  MS stable on Acthar. No diabetes on acthar and no relapses into acute a fib. Melatonin helps sleep. Given that Toni [name] has suffered from [place] for almost 30 years now.     REVIEW OF SYSTEMS: Out of a complete 14 system review of symptoms, the patient complains only of the following symptoms, and all other reviewed systems are negative. Ankle and lag edema, Urinary urge. She uses a BiPAP - 20-15 cm water. She endorsed today the fatigue severity score at only 19 points which is unusually low for her and her Epworth Sleepiness Scale at 7 points which is also lower than usual.   She has lost so much weight that she no longer applies to the risk of obesity hypoventilation and I congratulated her for her extraordinary efforts given her limited exercise capacity. Hemorrhoids, stool elimination is incomplete. Stool is soft. Abdominal pain,vagal feeling - gastroparesis? .    ALLERGIES: Allergies  Allergen Reactions  . Chlorhexidine Hives and Other (See Comments)    And, sores appear where applied  . Hydrocodone Nausea And Vomiting and Other (See Comments)    Hydrocodone causes vomiting  . Oxycodone Nausea And Vomiting and Other (See Comments)    Oral oxycodone causes vomiting  . Zoloft [Sertraline] Hives, Swelling  and Rash    HOME MEDICATIONS: Outpatient Medications Prior to Visit  Medication Sig Dispense Refill  . acetaminophen (TYLENOL) 500 MG tablet Take 500-1,000 mg by mouth every 6 (six) hours as needed for fever or headache (or pain).    . ACTHAR 80 UNIT/ML injectable gel INJECT 1ML (80 UNITS TOTAL) SUBCUTANEOUSLY EVERY OTHER  DAY FOR 10 DAYS EACH MONTH  (DISCARD 28 DAYS AFTER  FIRST USE) 5 mL 5  . ALPRAZolam (XANAX) 0.5 MG tablet TAKE 1 TABLET BY MOUTH 3  TIMES DAILY AS NEEDED FOR  ANXIETY OR SLEEP 90 tablet 0  . amoxicillin (AMOXIL) 500 MG capsule Take 500 mg by mouth 3 (three) times daily.    Marland Kitchen amoxicillin (AMOXIL) 500 MG tablet Take 500 mg by mouth in the morning, at noon, and at bedtime. X7 days.    . baclofen (LIORESAL) 20 MG tablet Take 1 tablet (20 mg total) by mouth 2 (two) times daily as needed for muscle spasms. 180 tablet 4  . BIOTIN PO Take 1 tablet by mouth daily.    Marland Kitchen buPROPion (WELLBUTRIN XL) 300 MG 24 hr tablet Take 1 tablet (300 mg total) by mouth daily. 30 tablet 0  . Calcium Carb-Cholecalciferol (CALCIUM + D3 PO) Take 2 tablets by mouth in the morning and at bedtime.    . cetirizine (ZYRTEC) 10 MG tablet Take 10 mg by mouth daily as needed for allergies.     . Cholecalciferol (VITAMIN D3) 50 MCG (2000 UT) TABS Take 2,000 Units by mouth in the morning and at bedtime.    . Coenzyme Q10 (CO Q-10 PO) Take 1 capsule by mouth daily.     Marland Kitchen diltiazem (CARDIZEM) 60 MG tablet Take 60  mg by mouth as needed.    . docusate sodium (COLACE) 100 MG capsule Take 1 capsule (100 mg total) by mouth daily. (Patient taking differently: Take 200 mg by mouth daily as needed for mild constipation.) 10 capsule 0  . famotidine (PEPCID) 20 MG tablet Take 20 mg by mouth at bedtime.   6  . furosemide (LASIX) 20 MG tablet Take 1 tablet by mouth daily, may take second dose if needed for excess edema (Patient taking differently: Take by mouth. 2 tablets in the morning and 3 tablets at night.) 180 tablet 3  .  gabapentin (NEURONTIN) 300 MG capsule TAKE 1 CAPSULE BY MOUTH UP  TO 4 TIMES DAILY (Patient taking differently: Take 300 mg by mouth every 6 (six) hours.) 360 capsule 3  . lactulose, encephalopathy, (GENERLAC) 10 GM/15ML SOLN TAKE 15 ML BY MOUTH TWICE DAILY AS NEEDED (Patient taking differently: Take 10 g by mouth See admin instructions. Take 10 grams by mouth once a day and an additional 10 grams once daily, as needed to move the bowels) 2717 mL 1  . levothyroxine (SYNTHROID, LEVOTHROID) 75 MCG tablet Take 75 mcg by mouth daily before breakfast.     . linaclotide (LINZESS) 145 MCG CAPS capsule Take 1 capsule (145 mcg total) by mouth daily before breakfast. 90 capsule 3  . melatonin 5 MG TABS Take 5 mg by mouth at bedtime.    . midodrine (PROAMATINE) 5 MG tablet Take 1 tablet (5 mg total) by mouth 3 (three) times daily with meals. (Patient taking differently: Take 5 mg by mouth 2 (two) times daily as needed (for blood pressure). To raise blood pressure.) 270 tablet 3  . Omega-3 Fatty Acids (FISH OIL PO) Take 1 capsule by mouth daily.    Marland Kitchen omeprazole (PRILOSEC) 20 MG capsule Take 20 mg by mouth daily before breakfast.     . ondansetron (ZOFRAN) 8 MG tablet Take 8 mg by mouth every morning.     Marland Kitchen oxybutynin (DITROPAN) 5 MG tablet Take 5 mg by mouth 3 (three) times daily as needed for bladder spasms.     . potassium chloride SA (KLOR-CON) 20 MEQ tablet Take 40 mEq by mouth daily.    . psyllium (METAMUCIL SMOOTH TEXTURE) 28 % packet Take 1 packet by mouth every evening.     . simvastatin (ZOCOR) 10 MG tablet Take 10 mg by mouth every evening.     Marland Kitchen SYRINGE/NEEDLE, DISP, 1 ML (BD LUER-LOK SYRINGE) 25G X 5/8" 1 ML MISC Use as directed to inject  Acthar 10 each prn  . Tamsulosin HCl (FLOMAX) 0.4 MG CAPS Take 0.4 mg by mouth in the morning.    . warfarin (COUMADIN) 5 MG tablet TAKE 1/2 TO 1 TABLET BY  MOUTH DAILY AS DIRECTED BY  COUMADIN CLINIC 90 tablet 3  . diltiazem (CARDIZEM CD) 240 MG 24 hr capsule Take  1 capsule (240 mg total) by mouth daily. 30 capsule 3   No facility-administered medications prior to visit.    PAST MEDICAL HISTORY: Past Medical History:  Diagnosis Date  . Abnormality of gait 03/01/2015  . Anxiety   . Arthritis    "knees" (01/19/2016)  . Asthma    as a child   . Back pain   . Cancer ([place])    / RENAL CELL CARCINOMA - GETS CT SCAN EVERY 6 MONTHS TO WATCH   . CHF (congestive heart failure) ([place])   . Chronic pain    "nerve pain from the MS"  .  Complex atypical endometrial hyperplasia   . Constipation   . Depression   . Diabetes mellitus without complication ([place])   . DVT (deep venous thrombosis) ([place]) 1983   "RLE; may have just been phlebitis; it was before the age of dopplers"  . Dysrhythmia    afib   . 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 GERD (gastroesophageal reflux disease)   . HLD (hyperlipidemia)   . Hypertension   . Hypotension   . Hypothyroidism   . Joint pain   . Multiple sclerosis ([place])    has had this may 1989-[place] reg doc  . OSA on CPAP    bipap machine - SLEEP APNEA WORSE SINCE COVID IN 1/21 PER PATIENT SETTING HAS INCREASED FROM 9 TO 30   . Osteoarthritis   . PAF (paroxysmal atrial fibrillation) ([place])    on coumadin; documented on monitor 06/2010  . Pneumonia 11/2011  . Sleep apnea     PAST SURGICAL HISTORY: Past Surgical History:  Procedure Laterality Date  . ANTERIOR CERVICAL DECOMP/DISCECTOMY FUSION  01/2004  . CARDIAC CATHETERIZATION  06/22/2010   normal coronary arteries, PAF  . CATARACT EXTRACTION, BILATERAL Bilateral   . CHILD BIRTHS     X2  . DILATATION & CURETTAGE/HYSTEROSCOPY WITH MYOSURE N/A 01/15/2020   Procedure: DILATATION & CURETTAGE/HYSTEROSCOPY WITH MYOSURE;  Surgeon: Arvella Nigh, MD;  Location: [facility];  Service: Gynecology;  Laterality: N/A;  pt in wheelchair-has MS  . DILATION AND CURETTAGE OF UTERUS    . DILATION AND CURETTAGE OF UTERUS N/A 02/16/2020    Procedure: DILATATION AND CURETTAGE;  Surgeon: Lafonda Mosses, MD;  Location: [facility];  Service: Gynecology;  Laterality: N/A;  NOT GENERAL -NO INTUBATION  . INFUSION PUMP IMPLANTATION  ~ 2009   baclofen infusion in lower abd  . INTRAUTERINE DEVICE (IUD) INSERTION N/A 02/16/2020   Procedure: INTRAUTERINE DEVICE (IUD) INSERTION - MIRENA;  Surgeon: Lafonda Mosses, MD;  Location: [facility];  Service: Gynecology;  Laterality: N/A;  . PAIN PUMP REVISION N/A 07/29/2014   Procedure: Baclofen pump replacement;  Surgeon: Erline Levine, MD;  Location: [place] [facility];  Service: Neurosurgery;  Laterality: N/A;  Baclofen pump replacement  . TONSILLECTOMY AND ADENOIDECTOMY    . VARICOSE VEIN SURGERY  ~ 1968  . VENOUS ABLATION  12/16/2010   radiofreq ablation -Dr Elisabeth Cara and [facility]  . WISDOM TOOTH EXTRACTION      FAMILY HISTORY: Family History  Problem Relation Age of Onset  . Coronary artery disease Father        at age 62  . Hyperlipidemia Father   . Thyroid disease Father   . Coronary artery disease Maternal Grandmother   . Depression Maternal Grandmother   . Cancer Paternal Grandmother        breast  . Depression Mother   . Sudden death Mother   . Anxiety disorder Mother   . Alcoholism Son     SOCIAL HISTORY: Social History   Socioeconomic History  . Marital status: Married    Spouse name: Toni [name]  . Number of children: 2  . Years of education: COLLEGE  . Highest education level: Not on file  Occupational History  . Occupation: RETIRED  Social Needs  . Financial resource strain: Not on file  . Food insecurity:    Worry: Not on file    Inability: Not on file  . Transportation needs:    Medical: Not  on file    Non-medical: Not on file  Tobacco Use  . Smoking status: Former Smoker    Packs/day: 0.50    Years: 7.00    Pack years: 3.50    Types: Cigarettes    Last attempt to quit: 08/21/1976    Years since quitting: 42.0  . Smokeless tobacco: Never Used  Substance and Sexual  Activity  . Alcohol use: Yes    Alcohol/week: 1.0 standard drinks    Types: 1 Glasses of wine per week  . Drug use: No  . Sexual activity: Not Currently  Lifestyle  . Physical activity:    Days per week: Not on file    Minutes per session: Not on file  . Stress: Not on file  Relationships  . Social connections:    Talks on phone: Not on file    Gets together: Not on file    Attends religious service: Not on file    Active member of club or organization: Not on file    Attends meetings of clubs or organizations: Not on file    Relationship status: Not on file  . Intimate partner violence:    Fear of current or ex partner: Not on file    Emotionally abused: Not on file    Physically abused: Not on file    Forced sexual activity: Not on file  Other Topics Concern  . Not on file  Social History Narrative   Lives in [place] with Husband Toni [name] [phone], [phone]   Currently in PT - [employer] [facility] , [address]     PHYSICAL EXAM  Vitals:   09/13/20 1305  BP: 114/67  Pulse: 83  Height: 5\' 2"  (1.575 m)   Body mass index is 32.56 kg/m.  Generalized:  in no acute distress, wheelchair    irregular heart beat, atrial  fibrillation.  Wheelchair bound but able to transfer. Excess skin after weight loss, chin and upper arms- bilateral edema up to the knees. Broken blood vessels.  Lungs are clear to auscultation.   Neurological examination :  Mentation: Alert oriented to time, place, history taking. Follows all commands speech and language fluent, good memory. She has noted word finding slowing, and slurring. Has to deliberately speak slow.  Cranial nerve : Pupils were equal round reactive to light.  Extraocular movements were full, visual field were full on confrontational test. Facial sensation and strength were normal. Tongue in midline, bo fasciculations. .  Head turning and shoulder shrug  were normal and symmetric. Motor: The patient cannot fully extend her right upper  extremity at the elbow, but she has good biceps flexion strength. Grip strength is weak and bilaterally there is bilaterally significant atrophy of the thenar eminence noted.  She has not just flattening of the eminence, but a groove,   SENSORY_ numbness, unable to do crossword puzzles and write letters. Her fingers feel stiff. She noted an abdominal and chest spasm, she is unable right now to walk has lower back pain and feels generalized weak and heavy. After has given her some energy back.   As to sensory she noticed ongoing numbness in the fingers of both hands the pounds but no numbness in the legs.  Loss of sensation at the feet and ankle was related to the significant edema.  We did ambulate today with a walker and watched her transfers- and we  did upper extremities exercises.     DIAGNOSTIC DATA (LABS, IMAGING, TESTING) - I reviewed patient records, labs,  notes, testing and imaging myself where available.  Lab Results  Component Value Date   WBC 6.9 06/11/2020   HGB 11.3 (L) 06/11/2020   HCT 35.1 (L) 06/11/2020   MCV 98.0 06/11/2020   PLT 256 06/11/2020      Component Value Date/Time   NA 141 06/10/2020 0116   NA 145 (H) 03/19/2019 1517   K 4.2 06/10/2020 0116   CL 98 06/10/2020 0116   CO2 35 (H) 06/10/2020 0116   GLUCOSE 82 06/10/2020 0116   BUN 14 06/10/2020 0116   BUN 24 03/19/2019 1517   CREATININE 0.40 (L) 06/10/2020 0116   CALCIUM 8.5 (L) 06/10/2020 0116   PROT 4.8 (L) 06/05/2020 0035   PROT 5.5 (L) 03/19/2019 1517   ALBUMIN 2.6 (L) 06/05/2020 0035   ALBUMIN 3.7 03/19/2019 1517   AST 15 06/05/2020 0035   ALT 15 06/05/2020 0035   ALKPHOS 37 (L) 06/05/2020 0035   BILITOT 0.9 06/05/2020 0035   BILITOT 0.2 03/19/2019 1517   GFRNONAA >60 06/10/2020 0116   GFRAA >60 04/07/2020 0342   Lab Results  Component Value Date   CHOL 111 03/19/2019   HDL 57 03/19/2019   LDLCALC 43 03/19/2019   TRIG 53 03/19/2019   CHOLHDL 3.0 04/22/2010   Lab Results  Component  Value Date   HGBA1C 4.9 06/05/2020   No results found for: [id number] Lab Results  Component Value Date   TSH 1.374 10/10/2019      ASSESSMENT AND PLAN 75 y.o. year old female  has a past medical history of Abnormality of gait (03/01/2015), Anxiety, Arthritis, Asthma, Back pain, Cancer (HCC), CHF (congestive heart failure) (HCC), Chronic pain, Complex atypical endometrial hyperplasia, Constipation, Depression, Diabetes mellitus without complication (HCC), DVT (deep venous thrombosis) (HCC) (1983), Dysrhythmia, Edema, GERD (gastroesophageal reflux disease), HLD (hyperlipidemia), Hypertension, Hypotension, Hypothyroidism, Joint pain, Multiple sclerosis (HCC), OSA on CPAP, Osteoarthritis, PAF (paroxysmal atrial fibrillation) (HCC), Pneumonia (11/2011), and Sleep apnea. here with:  1.  Severe break through on BIPAP since Covid, needs 3 liters on oxygen. AHI residual was 27.8  sleep apnea on BiPAP-no longer enough central apneas to cause her atrial fib, but we were unable to control her apnea and she is therefor not a cardioversion candidate.  BiPAP to 9/ 5 cm water - Mrs. Hanak has been extremely compliant has used at 100% of the last 90 days was 100% compliance by time, .  2.  Multiple sclerosis on Acthar, which she hs sparingly used over the last 6 month, has had gastroparesis , nausea, flushing.    OT/ PT helped with gait problems, sensory abnormalities and " MS hugs" .  3. POST COVID 19- BiPAP , but has hypoxemia.  4. Atrial fibrillation,chronic and persistent. CHF -   She will return for BiPAP  Titration and oxygen titration in LAB bed 2.   The patient wants to continue on Betaseron- and she will remain on Acthar.  [place]. I added a therapeutic course of ACTHAR.  Dr Anne Hahn will try an increase of baclofen application by pump. I asked for NCV and EMG.  As described above, she is now on 700 mcg a day.    Renal cell carcinoma - Dr. McDermiott- [facility].     She is  Post COVID  much more SOB and hypopnea is present, low sleep oxygen.       Visit was 35 minutes long and addressed 5 problems. BiPAP adjustment, gait and stool incontinence, and  carpal tunnel.  Walked with walker.  [name], MD  09/13/2020, 1:33 PM [facility] [address], [place], [place] [address] [phone]

## 2020-09-13 NOTE — Telephone Encounter (Signed)
Pt called, I notice on the sheet given to me by the nurse, I am scheduled for a MRI. I did not know anything about an MRI and was not discuss at my appt today. Would like a call from the nurse to clarify.

## 2020-09-14 ENCOUNTER — Ambulatory Visit (INDEPENDENT_AMBULATORY_CARE_PROVIDER_SITE_OTHER): Payer: Medicare Other | Admitting: Pharmacist

## 2020-09-14 DIAGNOSIS — J45909 Unspecified asthma, uncomplicated: Secondary | ICD-10-CM | POA: Diagnosis not present

## 2020-09-14 DIAGNOSIS — I4891 Unspecified atrial fibrillation: Secondary | ICD-10-CM

## 2020-09-14 DIAGNOSIS — I110 Hypertensive heart disease with heart failure: Secondary | ICD-10-CM | POA: Diagnosis not present

## 2020-09-14 DIAGNOSIS — G35 Multiple sclerosis: Secondary | ICD-10-CM | POA: Diagnosis not present

## 2020-09-14 DIAGNOSIS — Z7901 Long term (current) use of anticoagulants: Secondary | ICD-10-CM | POA: Diagnosis not present

## 2020-09-14 DIAGNOSIS — Z87440 Personal history of urinary (tract) infections: Secondary | ICD-10-CM | POA: Diagnosis not present

## 2020-09-14 DIAGNOSIS — I509 Heart failure, unspecified: Secondary | ICD-10-CM | POA: Diagnosis not present

## 2020-09-14 DIAGNOSIS — J9621 Acute and chronic respiratory failure with hypoxia: Secondary | ICD-10-CM | POA: Diagnosis not present

## 2020-09-14 LAB — POCT INR: INR: 3.7 — AB (ref 2.0–3.0)

## 2020-09-14 NOTE — Telephone Encounter (Signed)
Patient had called back in the event staff was able to assist in telling the patient the information below.  The patient would like for the MRI cervical spine to be canceled. Staff advised that would cancel for her.

## 2020-09-15 DIAGNOSIS — G35 Multiple sclerosis: Secondary | ICD-10-CM | POA: Diagnosis not present

## 2020-09-15 DIAGNOSIS — I110 Hypertensive heart disease with heart failure: Secondary | ICD-10-CM | POA: Diagnosis not present

## 2020-09-15 DIAGNOSIS — J45909 Unspecified asthma, uncomplicated: Secondary | ICD-10-CM | POA: Diagnosis not present

## 2020-09-15 DIAGNOSIS — Z87440 Personal history of urinary (tract) infections: Secondary | ICD-10-CM | POA: Diagnosis not present

## 2020-09-15 DIAGNOSIS — I509 Heart failure, unspecified: Secondary | ICD-10-CM | POA: Diagnosis not present

## 2020-09-15 DIAGNOSIS — J9621 Acute and chronic respiratory failure with hypoxia: Secondary | ICD-10-CM | POA: Diagnosis not present

## 2020-09-17 DIAGNOSIS — J9621 Acute and chronic respiratory failure with hypoxia: Secondary | ICD-10-CM | POA: Diagnosis not present

## 2020-09-17 DIAGNOSIS — F418 Other specified anxiety disorders: Secondary | ICD-10-CM | POA: Diagnosis not present

## 2020-09-17 DIAGNOSIS — H6123 Impacted cerumen, bilateral: Secondary | ICD-10-CM | POA: Diagnosis not present

## 2020-09-17 DIAGNOSIS — K5900 Constipation, unspecified: Secondary | ICD-10-CM | POA: Diagnosis not present

## 2020-09-17 DIAGNOSIS — I959 Hypotension, unspecified: Secondary | ICD-10-CM | POA: Diagnosis not present

## 2020-09-17 DIAGNOSIS — I480 Paroxysmal atrial fibrillation: Secondary | ICD-10-CM | POA: Diagnosis not present

## 2020-09-17 DIAGNOSIS — M79661 Pain in right lower leg: Secondary | ICD-10-CM | POA: Diagnosis not present

## 2020-09-17 DIAGNOSIS — G35 Multiple sclerosis: Secondary | ICD-10-CM | POA: Diagnosis not present

## 2020-09-17 DIAGNOSIS — N390 Urinary tract infection, site not specified: Secondary | ICD-10-CM | POA: Diagnosis not present

## 2020-09-20 ENCOUNTER — Telehealth: Payer: Self-pay | Admitting: Neurology

## 2020-09-20 ENCOUNTER — Ambulatory Visit (HOSPITAL_COMMUNITY): Payer: Medicare Other

## 2020-09-20 ENCOUNTER — Other Ambulatory Visit: Payer: Self-pay

## 2020-09-20 ENCOUNTER — Ambulatory Visit (INDEPENDENT_AMBULATORY_CARE_PROVIDER_SITE_OTHER): Payer: Medicare Other | Admitting: Internal Medicine

## 2020-09-20 ENCOUNTER — Telehealth: Payer: Self-pay

## 2020-09-20 ENCOUNTER — Encounter: Payer: Self-pay | Admitting: Internal Medicine

## 2020-09-20 ENCOUNTER — Ambulatory Visit (INDEPENDENT_AMBULATORY_CARE_PROVIDER_SITE_OTHER): Payer: Medicare Other

## 2020-09-20 VITALS — BP 115/66 | HR 69 | Ht 62.0 in | Wt 180.0 lb

## 2020-09-20 DIAGNOSIS — R0602 Shortness of breath: Secondary | ICD-10-CM

## 2020-09-20 DIAGNOSIS — I4821 Permanent atrial fibrillation: Secondary | ICD-10-CM

## 2020-09-20 DIAGNOSIS — Z7901 Long term (current) use of anticoagulants: Secondary | ICD-10-CM

## 2020-09-20 DIAGNOSIS — Z79899 Other long term (current) drug therapy: Secondary | ICD-10-CM

## 2020-09-20 LAB — POCT INR: INR: 2.5 (ref 2.0–3.0)

## 2020-09-20 NOTE — Progress Notes (Signed)
OFFICE NOTE  Chief Complaint:  Follow-up A. fib  Primary Care Physician: Prince Solian, MD  HPI:  Toni [name] is a 75 year old female with multiple sclerosis, paroxysmal atrial fibrillation, on Coumadin, morbid obesity, obstructive sleep apnea, and dyslipidemia. She has had worsening swelling in her legs, thought to be due to an MS flare. She has a history of varicose veins with severe venous insufficiency in both the right and left greater saphenous veins. The right greater saphenous vein was actually successfully ablated by my partner, Dr. Elisabeth Cara, about 1 to 2 years ago. She does have residual reflux in the left greater saphenous vein extending from the proximal calf up to the saphenofemoral junction. The vein measured between 4 and 8 mm with between 1 and 6 seconds of reflux. We had discussed ablation of that before, but she has canceled several times at procedures. At this point, I think there is little benefit to continuing to try for her to undergo ablation. 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 She can wear work showed a compression stockings and this does seem to help her. With regards to her atrial fibrillation she has not had significant recurrence to her knowledge. She continues on warfarin and has been therapeutic, with her INR of 1.9 in the office by fingerstick today.  Toni [name] returns today for followup. She reports her MS is somewhat progressive. She continues to have some enlargement of her legs but no significant swelling. There is a mild varicosities in the right lower extremity. She is unaware of her atrial fibrillation. INR today was 2.3.   I saw [name] back in the office today,  She is describing more shortness of breath and feels that she's been in A. Fib more recently. In fact today6/21her EKG does show persistent atrial fibrillation. In the past she's been paroxysmal , but it may be that she is more frequently  having A. Fib. She has trouble telling whether not she is more short of breath or symptomatic from A. Fib versus her multiple sclerosis. She is also reporting more swelling in her left leg.  Toni [name] returns for follow-up today. She recently saw her neurologist regarding her MS which seems to be flaring. In addition she was noted to have significant apneic events despite using CPAP. There is concern for possible central sleep apnea. Both of these episodes are probably contributing to her recurrent atrial fibrillation. She's been wearing a monitor for a period of time which indicates a burden of A. fib of about 80% of the time. It does not appear that she is in persistent A. fib, rather she has had some episodes of sinus rhythm, but they're much less frequent than her A. Fib.  I saw Toni [name] back today in the office. Recently I saw her as an add-on visit briefly as she was describing some chest pain when she was getting her INR checked. An EKG at that time showed no ischemia. I did not feel that this was cardiac. She continues intermittently have abnormal pains both in her chest as well as her abdomen and extremities. This may be related to MS. She's had difficulty with CPAP and apparently getting a good mask fit. She has been a persistent atrial fibrillation for about 6 months. It's unclear whether or not she symptomatic with this and therefore I have not pursued a rhythm control strategy. Her rate control is generally pretty good and she has been therapeutic  on warfarin. She is reporting worsening swelling of her legs although her weight she says is been stable. She has a history of venous insufficiency and had a right greater saphenous vein ablation by one of my partners. She's concerned about more swelling in her left leg and whether she's developed worsening venous insufficiency. She also reports cold feet with numbness in her extremities. As she is basically wheelchair bound, I cannot assess whether she  is having any claudication.  Toni [name] returns today for follow-up. She underwent venous and arterial lower external Dopplers. Arterial Dopplers were normal which showed no evidence of obstruction. Her venous Doppler showed no DVT. There was significant venous reflux in the left greater saphenous vein. This is an area where she has swelling and discomfort. This may be amenable to treatment. I increased her Lasix and she reports some improvement in her leg swelling. She's trying to elevate her feet and reports she is intolerant of wearing compression stockings.  02/03/2016  Toni [name] was seen today in the office for follow-up of recent hospitalization. She was seeing her neurologist and complained of left chest and left arm pain and was referred to the hospital. She ruled out for MI and was felt that her chest pain was on coronary. She underwent an echocardiogram which showed low-normal EF but was also found to be in A. fib with rapid ventricular response. Her A. fib is persistent at this time but the rate was elevated. She was started on Cardizem which helped improve her rate and was discharged on Cardizem CD 120 mg daily. Heart rate today is better controlled in the 70s. She reports her arm pain is improved and she said today that she "did not feel that it was her heart".  05/09/2016  Toni [name] was seen in the office for follow-up today. She reports improved heart rate control with heart rates between 50 and 80 on diltiazem. Over this will help with her mild cardiomyopathy with EF 50-55% on recent echo. She reports some increased fluid gain particularly in her abdomen. This may be due to some degree of right heart dysfunction. She's working with her neurologist for MS as well as her sleep apnea. There is a component of central sleep apnea and she is reportedly going to have additional sleep study to look for the need for BiPAP therapy. She denies any recurrent chest pain or left arm  pain.  02/14/2017  Toni [name] was seen today in follow-up. She was recently hospitalized. She said problems with increasing fluid gain. I increased her Lasix last time however when she was hospitalized she required high-dose Lasix and says she has she felt better with that. She subsequently been put on BiPAP therapy. She says her sleeping is improved significantly. She is concerned though recently she's had some increased weight gain. She feels like she may need an increasing dose of diuretic. Her last echo was in December 2017 which showed an EF of 55%. This is been stable. If this is some heart failure, may be related diastolic dysfunction however suspect is more likely related to autonomic dysfunction the setting of multiple sclerosis.  03/29/2017  Toni [name] returns today for follow-up. She continues to report lower extremity edema and abdominal swelling. We'll increase her Lasix however she has not noted significant weight loss. It is actually very difficult to estimate her weight because of her MS. We tried to place her on the scale today however she was unable to stand long enough to get an  accurate weight. She reports urination was not as good on 40 mg of Lasix but is currently taking 80 mg once daily and has better urination with that.  05/18/2017  Toni [name] was seen today in follow-up. She reports some improvement in her abdominal swelling and bloating. Check she feels like she might be somewhat dry now. Her weight is down to 246 pounds from 253 pounds. She is also working with the weight loss clinic at [facility]. She is interested in decreasing her diuretic which is reasonable. She asked again about probably trying to get back into sinus rhythm however she's been in persistent A. fib which is long-standing at this point for more than one or 2 years. Her last echo in December 2017 showed mild left atrial enlargement. Is not clear that she's symptomatic with her A. fib although does feel tired at  times. She says she's had some improvement with her sleep apnea therapy.  01/15/2018  Toni [name] returns today for follow-up.  She is continued to lose some weight.  Weight is down now to 218 pounds, significant improvement from her prior weight over 250 pounds.  She also had marked reduction in edema.  Her blood pressure is low normal at the moment and may benefit from reduction in her blood pressure medications.  Her sleep apnea has improved significantly according to Dr. Brett Fairy, especially her obstructive symptoms.  She still has central sleep apnea.  In addition she has secondary progressive MS and recently had worsening symptoms associated with that.  Heart rate was elevated today with regards to A. fib however that did come down after short period of time.  She again appears to be asymptomatic with her A. fib.  The main reason not to pursue sinus rhythm is the fact that she has been persistently in A. fib for well over 5 years, and the likelihood of reestablishing sinus rhythm is exceedingly low.  05/16/2018  Toni [name] is seen today in follow-up.  Her weight is down even further today to 204.  She has had recently some positional dizziness and low blood pressures.  This is likely due to the significant weight loss and I would recommend a decrease in her medications.  She says that she is breathing better.  Her sleep apnea has improved significantly according to her sleep doctor and she has having less symptoms related to her MS.  She is in permanent A. fib with controlled ventricular response.  03/10/2019  [name] returns today for follow-up.  She is done very well overall.  She is continued to lose weight with Dr. Migdalia Dk work.  Weight now 186 pounds down from 210 when I last saw her.  This is amazing since she is essentially sedentary due to her MS.  She reports reasonable control of her A. fib.  She has had some decrease in her PA P requirements with regards to sleep apnea.  Blood pressure is  well controlled.  She has recently had some swelling of the lower extremities and her abdomen.  She wonders if this could be some heart failure.  I suspect the lower extremity swelling is most likely due to venous insufficiency.  I previously performed a venous ablation on the right lower extremity and she still has some venous insufficiency of the left lower extremity however in the common femoral vein not amenable to ablation.  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   01/28/2020  [name] is seen today in follow-up.  She continues to lose some weight related to her diet by Dr. Leafy Ro.  She today is in A. fib with RVR.  Blood pressure has run low recently.  She has not needed to use midodrine that frequently.  She has follow-up with Dr. Brett Fairy in July.  I recently had corresponded with her about findings that Ms. Hollan was hypoxic during the day as well as at night.  There are plans to both give her oxygen via BiPAP as well as possible oxygen during the day.  She has been referred to see Dr. Hermina Staggers at [facility].  04/29/2020  [name] seen today for follow-up of A. fib.  Is been 3 months since I last saw her.  In the meantime she has been set up with bleed oxygen into her BiPAP.  I increased her diltiazem which is resulted in some improvement in heart rate although there is still a variability related to her A. fib.  Blood pressure is still reasonable in the low [id number] systolic.  She has not required taking additional midodrine.  She feels like she is getting some abdominal bloating/swelling and has started to take some Lasix as needed for this.  She was taken off of this due to orthostasis.  09/20/2020  Toni [name] is seen today in follow-up.  Recently she has been having more difficulty with hypoxemia and her MS.  All of this seems to have started after she had COVID-19.  She recently saw Dr. Brett Fairy and still is having difficulty with hypoxemia.  She is having a repeat sleep  study and I am told I would likely need to prescribe oxygen at night for her since apparently only either pulmonologist or cardiologist can order oxygen.  Recently her home health noted that she was swelling.  Her Lasix was increased to between 20 to 40 mg in the morning and 60 mg at night.  She wonders if this is led to some electrolyte abnormalities as its not been reassessed.  Blood pressure is good today.  Weight has been fairly stable.  PMHx:  Past Medical History:  Diagnosis Date  . Abnormality of gait 03/01/2015  . Anxiety   . Arthritis    "knees" (01/19/2016)  . Asthma    as a child   . Back pain   . Cancer ([place])    / RENAL CELL CARCINOMA - GETS CT SCAN EVERY 6 MONTHS TO WATCH   . CHF (congestive heart failure) ([place])   . Chronic pain    "nerve pain from the MS"  . Complex atypical endometrial hyperplasia   . Constipation   . Depression   . Diabetes mellitus without complication ([place])   . DVT (deep venous thrombosis) ([place]) 1983   "RLE; may have just been phlebitis; it was before the age of dopplers"  . Dysrhythmia    afib   . 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 GERD (gastroesophageal reflux disease)   . HLD (hyperlipidemia)   . Hypertension   . Hypotension   . Hypothyroidism   . Joint pain   . Multiple sclerosis ([place])    has had this may 1989-[place] reg doc  . OSA on CPAP    bipap machine - SLEEP APNEA WORSE SINCE COVID IN 1/21 PER PATIENT SETTING HAS INCREASED FROM 9 TO 30   . Osteoarthritis   . PAF (paroxysmal atrial fibrillation) ([facility])    on coumadin; documented  on monitor 06/2010  . Pneumonia 11/2011  . Sleep apnea     Past Surgical History:  Procedure Laterality Date  . ANTERIOR CERVICAL DECOMP/DISCECTOMY FUSION  01/2004  . CARDIAC CATHETERIZATION  06/22/2010   normal coronary arteries, PAF  . CATARACT EXTRACTION, BILATERAL Bilateral   . CHILD BIRTHS     X2  . DILATATION & CURETTAGE/HYSTEROSCOPY WITH  MYOSURE N/A 01/15/2020   Procedure: DILATATION & CURETTAGE/HYSTEROSCOPY WITH MYOSURE;  Surgeon: Arvella Nigh, MD;  Location: [facility];  Service: Gynecology;  Laterality: N/A;  pt in wheelchair-has MS  . DILATION AND CURETTAGE OF UTERUS    . DILATION AND CURETTAGE OF UTERUS N/A 02/16/2020   Procedure: DILATATION AND CURETTAGE;  Surgeon: Lafonda Mosses, MD;  Location: [facility];  Service: Gynecology;  Laterality: N/A;  NOT GENERAL -NO INTUBATION  . INFUSION PUMP IMPLANTATION  ~ 2009   baclofen infusion in lower abd  . INTRAUTERINE DEVICE (IUD) INSERTION N/A 02/16/2020   Procedure: INTRAUTERINE DEVICE (IUD) INSERTION - MIRENA;  Surgeon: Lafonda Mosses, MD;  Location: [facility];  Service: Gynecology;  Laterality: N/A;  . PAIN PUMP REVISION N/A 07/29/2014   Procedure: Baclofen pump replacement;  Surgeon: Erline Levine, MD;  Location: [facility];  Service: Neurosurgery;  Laterality: N/A;  Baclofen pump replacement  . TONSILLECTOMY AND ADENOIDECTOMY    . VARICOSE VEIN SURGERY  ~ 1968  . VENOUS ABLATION  12/16/2010   radiofreq ablation -Dr Elisabeth [facility]a [facility]  . WISDOM TOOTH EXTRACTION      FAMHx:  Family History  Problem Relation Age of Onset  . Coronary artery disease Father        at age 42  . Hyperlipidemia Father   . Thyroid disease Father   . Coronary artery disease Maternal Grandmother   . Depression Maternal Grandmother   . Cancer Paternal Grandmother        breast  . Depression Mother   . Sudden death Mother   . Anxiety disorder Mother   . Alcoholism Son     SOCHx:   reports that she quit smoking about 44 years ago. Her smoking use included cigarettes. She has a 3.50 pack-year smoking history. She has never used smokeless tobacco. She reports previous alcohol use. She reports that she does not use drugs.  ALLERGIES:  Allergies  Allergen Reactions  . Chlorhexidine Hives and Other (See Comments)    And, sores appear where applied  . Hydrocodone Nausea And Vomiting and Other (See  Comments)    Hydrocodone causes vomiting  . Oxycodone Nausea And Vomiting and Other (See Comments)    Oral oxycodone causes vomiting  . Zoloft [Sertraline] Hives, Swelling and Rash    ROS: Pertinent items noted in HPI and remainder of comprehensive ROS otherwise negative.  HOME MEDS: Current Outpatient Medications  Medication Sig Dispense Refill  . buPROPion (WELLBUTRIN XL) 150 MG 24 hr tablet Take 1 tablet (150 mg total) by mouth daily. (Patient not taking: Reported on 09/20/2020) 30 tablet 5  . cetirizine (ZYRTEC) 10 MG tablet Take 10 mg by mouth daily as needed for allergies.     . Cholecalciferol (VITAMIN D3) 50 MCG (2000 UT) TABS Take 2,000 Units by mouth in the morning and at bedtime.    . Coenzyme Q10 (CO Q-10 PO) Take 1 capsule by mouth daily.     . corticotropin (ACTHAR) 80 UNIT/ML injectable gel 10 days of 80 mg daily- one course - 5 mL 5  . diltiazem (CARDIZEM) 60 MG tablet Take  60 mg by mouth as needed.    . docusate sodium (COLACE) 100 MG capsule Take 1 capsule (100 mg total) by mouth daily. (Patient taking differently: Take 200 mg by mouth daily as needed for mild constipation.) 10 capsule 0  . famotidine (PEPCID) 20 MG tablet Take 20 mg by mouth at bedtime.   6  . furosemide (LASIX) 20 MG tablet Take 1 tablet by mouth daily, may take second dose if needed for excess edema (Patient taking differently: Take by mouth. 2 tablets in the morning and 3 tablets at night.) 180 tablet 3  . gabapentin (NEURONTIN) 300 MG capsule TAKE 1 CAPSULE BY MOUTH UP  TO 4 TIMES DAILY (Patient taking differently: Take 300 mg by mouth every 6 (six) hours.) 360 capsule 3  . lactulose, encephalopathy, (GENERLAC) 10 GM/15ML SOLN TAKE 15 ML BY MOUTH TWICE DAILY AS NEEDED (Patient taking differently: Take 10 g by mouth See admin instructions. Take 10 grams by mouth once a day and an additional 10 grams once daily, as needed to move the bowels) 2717 mL 1  . levothyroxine (SYNTHROID, LEVOTHROID) 75 MCG tablet  Take 75 mcg by mouth daily before breakfast.     . linaclotide (LINZESS) 145 MCG CAPS capsule Take 1 capsule (145 mcg total) by mouth daily before breakfast. 90 capsule 3  . melatonin 5 MG TABS Take 5 mg by mouth at bedtime.    . Omega-3 Fatty Acids (FISH OIL PO) Take 1 capsule by mouth daily.    Marland Kitchen omeprazole (PRILOSEC) 20 MG capsule Take 20 mg by mouth daily before breakfast.     . ondansetron (ZOFRAN) 8 MG tablet Take 8 mg by mouth every morning.     Marland Kitchen oxybutynin (DITROPAN) 5 MG tablet Take 5 mg by mouth 3 (three) times daily as needed for bladder spasms.     . potassium chloride SA (KLOR-CON) 20 MEQ tablet Take 40 mEq by mouth daily.    . psyllium (METAMUCIL SMOOTH TEXTURE) 28 % packet Take 1 packet by mouth every evening.     . simvastatin (ZOCOR) 10 MG tablet Take 10 mg by mouth every evening.     Marland Kitchen SYRINGE/NEEDLE, DISP, 1 ML (BD LUER-LOK SYRINGE) 25G X 5/8" 1 ML MISC Use as directed to inject  Acthar 10 each prn  . Tamsulosin HCl (FLOMAX) 0.4 MG CAPS Take 0.4 mg by mouth in the morning.    . warfarin (COUMADIN) 5 MG tablet TAKE 1/2 TO 1 TABLET BY  MOUTH DAILY AS DIRECTED BY  COUMADIN CLINIC 90 tablet 3  . acetaminophen (TYLENOL) 500 MG tablet Take 500-1,000 mg by mouth every 6 (six) hours as needed for fever or headache (or pain).    Marland Kitchen ALPRAZolam (XANAX) 0.5 MG tablet TAKE 1 TABLET BY MOUTH 3  TIMES DAILY AS NEEDED FOR  ANXIETY OR SLEEP 90 tablet 0  . baclofen (LIORESAL) 20 MG tablet Take 1 tablet (20 mg total) by mouth 2 (two) times daily as needed for muscle spasms. 180 tablet 4  . BIOTIN PO Take 1 tablet by mouth daily.    . Calcium Carb-Cholecalciferol (CALCIUM + D3 PO) Take 2 tablets by mouth in the morning and at bedtime. (Patient not taking: Reported on 09/20/2020)    . diltiazem (CARDIZEM CD) 240 MG 24 hr capsule Take 1 capsule (240 mg total) by mouth daily. 30 capsule 3  . escitalopram (LEXAPRO) 10 MG tablet Take 1 tablet (10 mg total) by mouth daily. (Patient not taking: Reported on  09/20/2020) 30 tablet 5  . midodrine (PROAMATINE) 5 MG tablet Take 1 tablet (5 mg total) by mouth 3 (three) times daily with meals. (Patient not taking: Reported on 09/20/2020) 270 tablet 3   No current facility-administered medications for this visit.    LABS/IMAGING: No results found. However, due to the size of the patient record, not all encounters were searched. Please check Results Review for a complete set of results. No results found.  VITALS: BP 115/66 (BP Location: Left Arm, Patient Position: Sitting)   Pulse 69   Ht 5\' 2"  (1.575 m)   Wt 180 lb (81.6 kg) Comment: Patient stated the weight  SpO2 99%   BMI 32.92 kg/m   EXAM: General appearance: alert, no distress and moderately obese Neck: no carotid bruit, no JVD and thyroid not enlarged, symmetric, no tenderness/mass/nodules Lungs: clear to auscultation bilaterally Heart: irregularly irregular rhythm Abdomen: soft, non-tender; bowel sounds normal; no masses,  no organomegaly Extremities: edema Trace lower extremity Pulses: 2+ and symmetric Skin: Skin color, texture, turgor normal. No rashes or lesions Neurologic: Mental status: Alert, oriented, thought content appropriate Psych: Pleasant  EKG: A. fib at 69-personally reviewed  ASSESSMENT: 1. Permanent atrial fibrillation - 80% frequency of A. Fib 2. Morbid obesity 3. Obstructive sleep apnea on BIPAP 4. Multiple sclerosis 5. Chronic anticoagulation on warfarin - INR 2.3 today 6. Chronic venous insufficiency - +LGSV reflux 7. Hypertension-controlled 8. Noncardiac chest and left arm pain 9. Non-ischemic cardiomyopathy - EF 50%, improved to 55% (07/2016) 10. Acute on chronic diastolic congestive heart failure  PLAN: 1.   Toni [name] continues to have issues which may be described as long-haul symptoms after COVID-19.  She has had hypoxemia on BiPAP and may likely need oxygen at night.  I am happy to prescribe that once the amount is determined by her sleep specialist.   She has had a recent increase in her diuretics.  I like to check a metabolic profile, magnesium and BNP and will adjust her medications accordingly.  It seems like she has more right heart symptoms however recent echo in October showed normal right heart function.  Follow-up with me in 6 months or sooner as necessary.  Pixie Casino, MD, [facility], [place] Director of the [facility] Lipid Disorders &  Cardiovascular Risk Reduction Clinic Diplomate of the American Board of Clinical Lipidology Attending Cardiologist  Direct Dial: [phone]  Fax: [phone]  Website:  [name] Jade Burkard 09/20/2020, 1:57 PM

## 2020-09-20 NOTE — Patient Instructions (Signed)
Continue taking 2.5mg  every Monday and 5mg  every other days of the week. Repeat INR in 1 week.

## 2020-09-20 NOTE — Telephone Encounter (Signed)
Pt asking why she was called by [employer] for a mask fitting, pt states she doesn't deal with [employer], please call.

## 2020-09-20 NOTE — Telephone Encounter (Signed)
PA submited through cover my meds/[employer] [facility]  KEY: [id number] Will hear a response within 72 hrs. I have also faxed over recent office notes for the patient to cover my meds.

## 2020-09-20 NOTE — Telephone Encounter (Signed)
[employer] was bought out by [employer]. That is the correct company that is contacting the patient.

## 2020-09-20 NOTE — Telephone Encounter (Signed)
done

## 2020-09-20 NOTE — Patient Instructions (Signed)
Medication Instructions:  Your physician recommends that you continue on your current medications as directed. Please refer to the Current Medication list given to you today.  *If you need a refill on your cardiac medications before your next appointment, please call your pharmacy*   Lab Work: CMET, BNP, Magnesium today   If you have labs (blood work) drawn today and your tests are completely normal, you will receive your results only by: Marland Kitchen MyChart Message (if you have MyChart) OR . A paper copy in the mail If you have any lab test that is abnormal or we need to change your treatment, we will call you to review the results.   Testing/Procedures: NONE   Follow-Up: At [facility], you and your health needs are our priority.  As part of our continuing mission to provide you with exceptional heart care, we have created designated Provider Care Teams.  These Care Teams include your primary Cardiologist (physician) and Advanced Practice Providers (APPs -  Physician Assistants and Nurse Practitioners) who all work together to provide you with the care you need, when you need it.  We recommend signing up for the patient portal called "MyChart".  Sign up information is provided on this After Visit Summary.  MyChart is used to connect with patients for Virtual Visits (Telemedicine).  Patients are able to view lab/test results, encounter notes, upcoming appointments, etc.  Non-urgent messages can be sent to your provider as well.   To learn more about what you can do with MyChart, go to [email or web address].    Your next appointment:   6 month(s)  The format for your next appointment:   In Person  Provider:   You may see Pixie Casino, MD or one of the following Advanced Practice Providers on your designated Care Team:    Almyra Deforest, PA-C  Fabian Sharp, PA-C or   Roby Lofts, [place]    Other Instructions

## 2020-09-21 ENCOUNTER — Other Ambulatory Visit: Payer: Self-pay | Admitting: *Deleted

## 2020-09-21 DIAGNOSIS — J45909 Unspecified asthma, uncomplicated: Secondary | ICD-10-CM | POA: Diagnosis not present

## 2020-09-21 DIAGNOSIS — Z87440 Personal history of urinary (tract) infections: Secondary | ICD-10-CM | POA: Diagnosis not present

## 2020-09-21 DIAGNOSIS — I509 Heart failure, unspecified: Secondary | ICD-10-CM | POA: Diagnosis not present

## 2020-09-21 DIAGNOSIS — I110 Hypertensive heart disease with heart failure: Secondary | ICD-10-CM | POA: Diagnosis not present

## 2020-09-21 DIAGNOSIS — G35 Multiple sclerosis: Secondary | ICD-10-CM | POA: Diagnosis not present

## 2020-09-21 DIAGNOSIS — J9621 Acute and chronic respiratory failure with hypoxia: Secondary | ICD-10-CM | POA: Diagnosis not present

## 2020-09-21 LAB — COMPREHENSIVE METABOLIC PANEL
ALT: 15 IU/L (ref 0–32)
AST: 16 IU/L (ref 0–40)
Albumin/Globulin Ratio: 2 (ref 1.2–2.2)
Albumin: 3.9 g/dL (ref 3.7–4.7)
Alkaline Phosphatase: 67 IU/L (ref 44–121)
BUN/Creatinine Ratio: 30 — ABNORMAL HIGH (ref 12–28)
BUN: 23 mg/dL (ref 8–27)
Bilirubin Total: 0.3 mg/dL (ref 0.0–1.2)
CO2: 36 mmol/L — ABNORMAL HIGH (ref 20–29)
Calcium: 9 mg/dL (ref 8.7–10.3)
Chloride: 98 mmol/L (ref 96–106)
Creatinine, Ser: 0.76 mg/dL (ref 0.57–1.00)
GFR calc Af Amer: 90 mL/min/{1.73_m2} (ref >59)
GFR calc non Af Amer: 78 mL/min/{1.73_m2} (ref >59)
Globulin, Total: 2 g/dL (ref 1.5–4.5)
Glucose: 87 mg/dL (ref 65–99)
Potassium: 4.5 mmol/L (ref 3.5–5.2)
Sodium: 147 mmol/L — ABNORMAL HIGH (ref 134–144)
Total Protein: 5.9 g/dL — ABNORMAL LOW (ref 6.0–8.5)

## 2020-09-21 LAB — MAGNESIUM: Magnesium: 2.1 mg/dL (ref 1.6–2.3)

## 2020-09-21 LAB — BRAIN NATRIURETIC PEPTIDE: BNP: 43.3 pg/mL (ref 0.0–100.0)

## 2020-09-21 NOTE — Telephone Encounter (Signed)
The patient to advise her that [facility] out.  Patient was confused why she needed to go for a mask refit when our sleep tech did that here at the office visit.  Informed her that the order was written that way and they were not aware that she have that already done here in the office.  Instructed her that she does not need to schedule an appointment for mask refit since we completed that here.  Patient is scheduled for conduction study and will come in for the BiPAP titration study.  Patient verbalized understanding and had no other questions.

## 2020-09-21 NOTE — Telephone Encounter (Signed)
Received a paper form to fill out extra questions in regards to the PA for the patient.  I have faxed that into the number that was provided for [facility].  Still awaiting determination

## 2020-09-23 ENCOUNTER — Encounter: Payer: Self-pay | Admitting: Neurology

## 2020-09-23 NOTE — Telephone Encounter (Signed)
PA denied for the patient.  Appeal letter has been completed and faxed to be the appeals team through [employer] expedited. Will wait on determination

## 2020-09-24 DIAGNOSIS — I110 Hypertensive heart disease with heart failure: Secondary | ICD-10-CM | POA: Diagnosis not present

## 2020-09-24 DIAGNOSIS — I509 Heart failure, unspecified: Secondary | ICD-10-CM | POA: Diagnosis not present

## 2020-09-24 DIAGNOSIS — J45909 Unspecified asthma, uncomplicated: Secondary | ICD-10-CM | POA: Diagnosis not present

## 2020-09-24 DIAGNOSIS — G35 Multiple sclerosis: Secondary | ICD-10-CM | POA: Diagnosis not present

## 2020-09-24 DIAGNOSIS — Z87440 Personal history of urinary (tract) infections: Secondary | ICD-10-CM | POA: Diagnosis not present

## 2020-09-24 DIAGNOSIS — J9621 Acute and chronic respiratory failure with hypoxia: Secondary | ICD-10-CM | POA: Diagnosis not present

## 2020-09-28 ENCOUNTER — Telehealth: Payer: Medicare Other | Admitting: Adult Health

## 2020-09-28 ENCOUNTER — Ambulatory Visit (INDEPENDENT_AMBULATORY_CARE_PROVIDER_SITE_OTHER): Payer: Medicare Other | Admitting: Cardiovascular Disease

## 2020-09-28 DIAGNOSIS — I110 Hypertensive heart disease with heart failure: Secondary | ICD-10-CM | POA: Diagnosis not present

## 2020-09-28 DIAGNOSIS — Z7901 Long term (current) use of anticoagulants: Secondary | ICD-10-CM | POA: Diagnosis not present

## 2020-09-28 DIAGNOSIS — J9621 Acute and chronic respiratory failure with hypoxia: Secondary | ICD-10-CM | POA: Diagnosis not present

## 2020-09-28 DIAGNOSIS — G35 Multiple sclerosis: Secondary | ICD-10-CM | POA: Diagnosis not present

## 2020-09-28 DIAGNOSIS — Z87440 Personal history of urinary (tract) infections: Secondary | ICD-10-CM | POA: Diagnosis not present

## 2020-09-28 DIAGNOSIS — J45909 Unspecified asthma, uncomplicated: Secondary | ICD-10-CM | POA: Diagnosis not present

## 2020-09-28 DIAGNOSIS — I509 Heart failure, unspecified: Secondary | ICD-10-CM | POA: Diagnosis not present

## 2020-09-28 DIAGNOSIS — I4891 Unspecified atrial fibrillation: Secondary | ICD-10-CM

## 2020-09-28 LAB — POCT INR: INR: 2.4 (ref 2.0–3.0)

## 2020-09-29 DIAGNOSIS — I509 Heart failure, unspecified: Secondary | ICD-10-CM | POA: Diagnosis not present

## 2020-09-29 DIAGNOSIS — G35 Multiple sclerosis: Secondary | ICD-10-CM | POA: Diagnosis not present

## 2020-09-29 DIAGNOSIS — J9621 Acute and chronic respiratory failure with hypoxia: Secondary | ICD-10-CM | POA: Diagnosis not present

## 2020-09-29 DIAGNOSIS — Z87440 Personal history of urinary (tract) infections: Secondary | ICD-10-CM | POA: Diagnosis not present

## 2020-09-29 DIAGNOSIS — J45909 Unspecified asthma, uncomplicated: Secondary | ICD-10-CM | POA: Diagnosis not present

## 2020-09-29 DIAGNOSIS — I110 Hypertensive heart disease with heart failure: Secondary | ICD-10-CM | POA: Diagnosis not present

## 2020-09-30 ENCOUNTER — Encounter: Payer: Self-pay | Admitting: Neurology

## 2020-09-30 ENCOUNTER — Ambulatory Visit: Payer: Medicare Other

## 2020-10-01 DIAGNOSIS — N3000 Acute cystitis without hematuria: Secondary | ICD-10-CM | POA: Diagnosis not present

## 2020-10-04 ENCOUNTER — Ambulatory Visit (INDEPENDENT_AMBULATORY_CARE_PROVIDER_SITE_OTHER): Payer: Medicare Other | Admitting: Neurology

## 2020-10-04 ENCOUNTER — Encounter: Payer: Self-pay | Admitting: Neurology

## 2020-10-04 DIAGNOSIS — G5603 Carpal tunnel syndrome, bilateral upper limbs: Secondary | ICD-10-CM

## 2020-10-04 DIAGNOSIS — R0609 Other forms of dyspnea: Secondary | ICD-10-CM

## 2020-10-04 DIAGNOSIS — E240 Pituitary-dependent Cushing's disease: Secondary | ICD-10-CM

## 2020-10-04 DIAGNOSIS — I4891 Unspecified atrial fibrillation: Secondary | ICD-10-CM

## 2020-10-04 DIAGNOSIS — U099 Post covid-19 condition, unspecified: Secondary | ICD-10-CM

## 2020-10-04 DIAGNOSIS — G4737 Central sleep apnea in conditions classified elsewhere: Secondary | ICD-10-CM

## 2020-10-04 DIAGNOSIS — G35 Multiple sclerosis: Secondary | ICD-10-CM

## 2020-10-04 DIAGNOSIS — G4733 Obstructive sleep apnea (adult) (pediatric): Secondary | ICD-10-CM

## 2020-10-04 HISTORY — DX: Carpal tunnel syndrome, bilateral upper limbs: G56.03

## 2020-10-04 NOTE — Progress Notes (Signed)
Please refer to EMG and nerve conduction procedure note.  

## 2020-10-04 NOTE — Procedures (Signed)
     HISTORY:  Toni [name] is a 75 year old patient with a history of multiple sclerosis who is reported a several year history of numbness in both hands, left greater than right with difficulty with gripping and dropping things.  She is being evaluated for a possible neuropathy or a cervical radiculopathy.  NERVE CONDUCTION STUDIES:  Nerve conduction studies were performed on both upper extremities.  The distal motor latencies for the median nerves were prolonged bilaterally with low motor amplitudes for these nerves bilaterally.  The distal motor latencies and motor amplitudes for the ulnar nerves were normal bilaterally.  The nerve conduction velocities for the median nerves were normal bilaterally with slowing seen above the elbows for the ulnar nerves bilaterally, normal below the elbows bilaterally.  The sensory latencies for the radial nerves were normal bilaterally.  The right median sensory latency was normal, the left median sensory latency was unobtainable.  The ulnar sensory latencies were prolonged bilaterally.  The F-wave latencies for the ulnar nerves were prolonged bilaterally.  EMG STUDIES:  EMG study was performed on the left upper extremity:  The first dorsal interosseous muscle reveals 2 to 3 K units with full recruitment. No fibrillations or positive waves were noted. The abductor pollicis brevis muscle reveals 1 to 3 K units with decreased recruitment. 2+ fibrillations and positive waves were noted. The extensor indicis proprius muscle reveals 1 to 3 K units with full recruitment. No fibrillations or positive waves were noted. The pronator teres muscle reveals 2 to 3 K units with full recruitment. No fibrillations or positive waves were noted. The flexor digitorum profundus muscle (III-IV) reveals 1 to 3 K units with full recruitment.  No fibrillations or positive waves were seen. The biceps muscle reveals 1 to 2 K units with full recruitment. No fibrillations or positive  waves were noted. The triceps muscle reveals 2 to 4 K units with full recruitment. No fibrillations or positive waves were noted. The anterior deltoid muscle reveals 2 to 3 K units with full recruitment. No fibrillations or positive waves were noted.  A limited EMG study was performed on the right upper extremity:  The first dorsal interosseous muscle reveals 2 to 4 K units with full recruitment. No fibrillations or positive waves were noted. The abductor pollicis brevis muscle reveals 2 to 3 K units with slightly reduced recruitment. No fibrillations or positive waves were noted. The extensor indicis proprius muscle reveals 1 to 3 K units with full recruitment. No fibrillations or positive waves were noted.   IMPRESSION:  Nerve conduction studies done on both upper extremities shows evidence of bilateral carpal tunnel syndrome, more significant on the left.  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  The EMG evaluation of the left upper extremity showed findings consistent with carpal tunnel syndrome, no evidence of an overlying cervical radiculopathy was seen.  A limited EMG study of the right upper extremity again shows findings consistent with carpal tunnel syndrome.  Carpal tunnel appears to be relatively severe on the left, more mild on the right.  Jill Alexanders MD 10/04/2020 4:03 PM  [facility] [address] [place], [place] [address]  Phone [phone] Fax [phone]

## 2020-10-05 ENCOUNTER — Telehealth: Payer: Self-pay | Admitting: Neurology

## 2020-10-05 DIAGNOSIS — L821 Other seborrheic keratosis: Secondary | ICD-10-CM | POA: Diagnosis not present

## 2020-10-05 DIAGNOSIS — L905 Scar conditions and fibrosis of skin: Secondary | ICD-10-CM | POA: Diagnosis not present

## 2020-10-05 DIAGNOSIS — D1801 Hemangioma of skin and subcutaneous tissue: Secondary | ICD-10-CM | POA: Diagnosis not present

## 2020-10-05 DIAGNOSIS — Z85828 Personal history of other malignant neoplasm of skin: Secondary | ICD-10-CM | POA: Diagnosis not present

## 2020-10-05 NOTE — Telephone Encounter (Signed)
Appeal was overturned and approved for the patient.  [employer] is approved until August 10, 2021 REF # [id number] QI#:[id number]

## 2020-10-05 NOTE — Telephone Encounter (Signed)
-----   Message from [name], MD sent at 10/04/2020  4:46 PM EST ----- IMPRESSION:  Nerve conduction studies done on both upper extremities shows evidence of bilateral carpal tunnel syndrome, more significant on the left.  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  The EMG evaluation of the left upper extremity showed findings consistent with carpal tunnel syndrome, no evidence of an overlying cervical radiculopathy was seen.  A limited EMG study of the right upper extremity again shows findings consistent with carpal tunnel syndrome.  Carpal tunnel appears to be relatively severe on the left, more mild on the right.  Jill Alexanders MD 10/04/2020 4:03 PM

## 2020-10-05 NOTE — Progress Notes (Signed)
  [place]    Nerve / Sites Muscle Latency Ref. Amplitude Ref. Rel Amp Segments Distance Velocity Ref. Area    ms ms mV mV %  cm m/s m/s mVms  L Median - APB     Wrist APB 6.4 ?4.4 0.5 ?4.0 100 Wrist - APB 7   2.4     Upper arm APB 10.6  0.7  139 Upper arm - Wrist 21 49 ?49 5.5     Ulnar Wrist APB 4.4  2.1  289 Ulnar Wrist - APB    4.9  R Median - APB     Wrist APB 5.0 ?4.4 3.1 ?4.0 100 Wrist - APB 7   10.5     Upper arm APB 9.3  2.6  84.1 Upper arm - Wrist 21 49 ?49 8.7  L Ulnar - ADM     Wrist ADM 3.3 ?3.3 5.5 ?6.0 100 Wrist - ADM 7   12.8     B.Elbow ADM 6.9  5.3  95.7 B.Elbow - Wrist 20 55 ?49 13.3     A.Elbow ADM 9.9  4.9  93.4 A.Elbow - B.Elbow 10 34 ?49 18.5  R Ulnar - ADM     Wrist ADM 2.6 ?3.3 2.2 ?6.0 100 Wrist - ADM 7   4.6     B.Elbow ADM 6.6  5.2  236 B.Elbow - Wrist 20 51 ?49 18.7     A.Elbow ADM 9.1  5.2  101 A.Elbow - B.Elbow 10 39 ?49 20.6             SNC    Nerve / Sites Rec. Site Peak Lat Ref.  Amp Ref. Segments Distance    ms ms V V  cm  L Radial - Anatomical snuff box (Forearm)     Forearm Wrist 2.4 ?2.9 16 ?15 Forearm - Wrist 10  R Radial - Anatomical snuff box (Forearm)     Forearm Wrist 2.3 ?2.9 26 ?15 Forearm - Wrist 10  R Median - Orthodromic (Dig II, Mid palm)     Dig II Wrist 3.2 ?3.4 10 ?10 Dig II - Wrist 13  L Median - Orthodromic (Dig II, Mid palm)     Dig II Wrist NR ?3.4 NR ?10 Dig II - Wrist 13  L Ulnar - Orthodromic, (Dig V, Mid palm)     Dig V Wrist 3.6 ?3.1 2 ?5 Dig V - Wrist 11  R Ulnar - Orthodromic, (Dig V, Mid palm)     Dig V Wrist 3.3 ?3.1 3 ?5 Dig V - Wrist 62                 F  Wave    Nerve F Lat Ref.   ms ms  L Ulnar - ADM 32.5 ?32.0  R Ulnar - ADM 34.4 ?32.0

## 2020-10-05 NOTE — Telephone Encounter (Signed)
Called the patient and reviewed the nerve conduction study results.  Dr. [name] had already made her aware of the findings.  She just wanted to make sure that her primary care and orthopedic surgeon had the reports as well.  Advised I would make sure to the recent just in case.  Patient was appreciative for the call and verbalized understanding.

## 2020-10-06 DIAGNOSIS — N3020 Other chronic cystitis without hematuria: Secondary | ICD-10-CM | POA: Diagnosis not present

## 2020-10-06 DIAGNOSIS — C539 Malignant neoplasm of cervix uteri, unspecified: Secondary | ICD-10-CM | POA: Diagnosis not present

## 2020-10-06 DIAGNOSIS — N3946 Mixed incontinence: Secondary | ICD-10-CM | POA: Diagnosis not present

## 2020-10-07 ENCOUNTER — Telehealth: Payer: Self-pay

## 2020-10-07 DIAGNOSIS — J9621 Acute and chronic respiratory failure with hypoxia: Secondary | ICD-10-CM | POA: Diagnosis not present

## 2020-10-07 DIAGNOSIS — J45909 Unspecified asthma, uncomplicated: Secondary | ICD-10-CM | POA: Diagnosis not present

## 2020-10-07 DIAGNOSIS — I110 Hypertensive heart disease with heart failure: Secondary | ICD-10-CM | POA: Diagnosis not present

## 2020-10-07 DIAGNOSIS — Z87440 Personal history of urinary (tract) infections: Secondary | ICD-10-CM | POA: Diagnosis not present

## 2020-10-07 DIAGNOSIS — G35 Multiple sclerosis: Secondary | ICD-10-CM | POA: Diagnosis not present

## 2020-10-07 DIAGNOSIS — I509 Heart failure, unspecified: Secondary | ICD-10-CM | POA: Diagnosis not present

## 2020-10-07 NOTE — Telephone Encounter (Signed)
lled in to report that she had started the amoxacillin and that she will be on it for 7 days

## 2020-10-09 DIAGNOSIS — Z87440 Personal history of urinary (tract) infections: Secondary | ICD-10-CM | POA: Diagnosis not present

## 2020-10-09 DIAGNOSIS — J45909 Unspecified asthma, uncomplicated: Secondary | ICD-10-CM | POA: Diagnosis not present

## 2020-10-09 DIAGNOSIS — I110 Hypertensive heart disease with heart failure: Secondary | ICD-10-CM | POA: Diagnosis not present

## 2020-10-09 DIAGNOSIS — I509 Heart failure, unspecified: Secondary | ICD-10-CM | POA: Diagnosis not present

## 2020-10-09 DIAGNOSIS — J9621 Acute and chronic respiratory failure with hypoxia: Secondary | ICD-10-CM | POA: Diagnosis not present

## 2020-10-09 DIAGNOSIS — G35 Multiple sclerosis: Secondary | ICD-10-CM | POA: Diagnosis not present

## 2020-10-11 ENCOUNTER — Other Ambulatory Visit: Payer: Self-pay

## 2020-10-11 ENCOUNTER — Ambulatory Visit (INDEPENDENT_AMBULATORY_CARE_PROVIDER_SITE_OTHER): Payer: Medicare Other | Admitting: Neurology

## 2020-10-11 DIAGNOSIS — G4734 Idiopathic sleep related nonobstructive alveolar hypoventilation: Secondary | ICD-10-CM

## 2020-10-11 DIAGNOSIS — K219 Gastro-esophageal reflux disease without esophagitis: Secondary | ICD-10-CM | POA: Diagnosis not present

## 2020-10-11 DIAGNOSIS — U099 Post covid-19 condition, unspecified: Secondary | ICD-10-CM

## 2020-10-11 DIAGNOSIS — C649 Malignant neoplasm of unspecified kidney, except renal pelvis: Secondary | ICD-10-CM | POA: Diagnosis not present

## 2020-10-11 DIAGNOSIS — G35 Multiple sclerosis: Secondary | ICD-10-CM | POA: Diagnosis not present

## 2020-10-11 DIAGNOSIS — G4737 Central sleep apnea in conditions classified elsewhere: Secondary | ICD-10-CM

## 2020-10-11 DIAGNOSIS — J45909 Unspecified asthma, uncomplicated: Secondary | ICD-10-CM | POA: Diagnosis not present

## 2020-10-11 DIAGNOSIS — Z8701 Personal history of pneumonia (recurrent): Secondary | ICD-10-CM | POA: Diagnosis not present

## 2020-10-11 DIAGNOSIS — F419 Anxiety disorder, unspecified: Secondary | ICD-10-CM | POA: Diagnosis not present

## 2020-10-11 DIAGNOSIS — I509 Heart failure, unspecified: Secondary | ICD-10-CM | POA: Diagnosis not present

## 2020-10-11 DIAGNOSIS — I8390 Asymptomatic varicose veins of unspecified lower extremity: Secondary | ICD-10-CM | POA: Diagnosis not present

## 2020-10-11 DIAGNOSIS — E039 Hypothyroidism, unspecified: Secondary | ICD-10-CM | POA: Diagnosis not present

## 2020-10-11 DIAGNOSIS — J9621 Acute and chronic respiratory failure with hypoxia: Secondary | ICD-10-CM | POA: Diagnosis not present

## 2020-10-11 DIAGNOSIS — E240 Pituitary-dependent Cushing's disease: Secondary | ICD-10-CM

## 2020-10-11 DIAGNOSIS — G4733 Obstructive sleep apnea (adult) (pediatric): Secondary | ICD-10-CM

## 2020-10-11 DIAGNOSIS — E669 Obesity, unspecified: Secondary | ICD-10-CM | POA: Diagnosis not present

## 2020-10-11 DIAGNOSIS — I872 Venous insufficiency (chronic) (peripheral): Secondary | ICD-10-CM | POA: Diagnosis not present

## 2020-10-11 DIAGNOSIS — Z8616 Personal history of COVID-19: Secondary | ICD-10-CM | POA: Diagnosis not present

## 2020-10-11 DIAGNOSIS — E785 Hyperlipidemia, unspecified: Secondary | ICD-10-CM | POA: Diagnosis not present

## 2020-10-11 DIAGNOSIS — M170 Bilateral primary osteoarthritis of knee: Secondary | ICD-10-CM | POA: Diagnosis not present

## 2020-10-11 DIAGNOSIS — I951 Orthostatic hypotension: Secondary | ICD-10-CM | POA: Diagnosis not present

## 2020-10-11 DIAGNOSIS — Z6832 Body mass index (BMI) 32.0-32.9, adult: Secondary | ICD-10-CM | POA: Diagnosis not present

## 2020-10-11 DIAGNOSIS — F32A Depression, unspecified: Secondary | ICD-10-CM | POA: Diagnosis not present

## 2020-10-11 DIAGNOSIS — I4821 Permanent atrial fibrillation: Secondary | ICD-10-CM | POA: Diagnosis not present

## 2020-10-11 DIAGNOSIS — N39490 Overflow incontinence: Secondary | ICD-10-CM | POA: Diagnosis not present

## 2020-10-11 DIAGNOSIS — I480 Paroxysmal atrial fibrillation: Secondary | ICD-10-CM | POA: Diagnosis not present

## 2020-10-11 DIAGNOSIS — Z87440 Personal history of urinary (tract) infections: Secondary | ICD-10-CM | POA: Diagnosis not present

## 2020-10-11 DIAGNOSIS — K5904 Chronic idiopathic constipation: Secondary | ICD-10-CM | POA: Diagnosis not present

## 2020-10-11 DIAGNOSIS — I110 Hypertensive heart disease with heart failure: Secondary | ICD-10-CM | POA: Diagnosis not present

## 2020-10-11 DIAGNOSIS — R0609 Other forms of dyspnea: Secondary | ICD-10-CM

## 2020-10-11 DIAGNOSIS — E1151 Type 2 diabetes mellitus with diabetic peripheral angiopathy without gangrene: Secondary | ICD-10-CM | POA: Diagnosis not present

## 2020-10-13 ENCOUNTER — Telehealth: Payer: Self-pay

## 2020-10-13 ENCOUNTER — Ambulatory Visit (INDEPENDENT_AMBULATORY_CARE_PROVIDER_SITE_OTHER): Payer: Medicare Other | Admitting: Pharmacist Clinician (PhC)/ Clinical Pharmacy Specialist

## 2020-10-13 DIAGNOSIS — Z7901 Long term (current) use of anticoagulants: Secondary | ICD-10-CM | POA: Diagnosis not present

## 2020-10-13 DIAGNOSIS — J9621 Acute and chronic respiratory failure with hypoxia: Secondary | ICD-10-CM | POA: Diagnosis not present

## 2020-10-13 DIAGNOSIS — I4891 Unspecified atrial fibrillation: Secondary | ICD-10-CM | POA: Diagnosis not present

## 2020-10-13 DIAGNOSIS — I110 Hypertensive heart disease with heart failure: Secondary | ICD-10-CM | POA: Diagnosis not present

## 2020-10-13 DIAGNOSIS — Z87440 Personal history of urinary (tract) infections: Secondary | ICD-10-CM | POA: Diagnosis not present

## 2020-10-13 DIAGNOSIS — I509 Heart failure, unspecified: Secondary | ICD-10-CM | POA: Diagnosis not present

## 2020-10-13 DIAGNOSIS — G35 Multiple sclerosis: Secondary | ICD-10-CM | POA: Diagnosis not present

## 2020-10-13 DIAGNOSIS — J45909 Unspecified asthma, uncomplicated: Secondary | ICD-10-CM | POA: Diagnosis not present

## 2020-10-13 LAB — POCT INR: INR: 3.3 — AB (ref 2.0–3.0)

## 2020-10-13 NOTE — Telephone Encounter (Signed)
lmom stated that pt overdue for inr

## 2020-10-18 ENCOUNTER — Telehealth: Payer: Self-pay

## 2020-10-18 ENCOUNTER — Other Ambulatory Visit: Payer: Self-pay | Admitting: Diagnostic Neuroimaging

## 2020-10-18 ENCOUNTER — Telehealth: Payer: Self-pay | Admitting: Neurology

## 2020-10-18 ENCOUNTER — Telehealth: Payer: Self-pay | Admitting: *Deleted

## 2020-10-18 DIAGNOSIS — N8502 Endometrial intraepithelial neoplasia [EIN]: Secondary | ICD-10-CM

## 2020-10-18 MED ORDER — BUPROPION HCL ER (XL) 150 MG PO TB24: 150.0000 mg | ORAL_TABLET | Freq: Every day | ORAL | 1 refills | Status: DC

## 2020-10-18 MED ORDER — ESCITALOPRAM OXALATE 10 MG PO TABS: 10.0000 mg | ORAL_TABLET | Freq: Every day | ORAL | 1 refills | Status: DC

## 2020-10-18 NOTE — Telephone Encounter (Signed)
Patient called and stated "I have an appointment tomorrow and wanted to know what will happen at that appointment." Explained to the patient that per Dr Charisse March note patient will have a pelvic exam. Also that Dr Berline Lopes didn't say patient will have a biopsy, but she may.

## 2020-10-18 NOTE — Telephone Encounter (Signed)
Pt. is requesting 90 day supply refills for buPROPion (WELLBUTRIN XL) 150 MG 24 hr tablet & escitalopram (LEXAPRO) 10 MG tablet.  Pharmacy: [facility]

## 2020-10-18 NOTE — Telephone Encounter (Signed)
TC to review MU, no answer, left message to return call.  

## 2020-10-18 NOTE — Telephone Encounter (Signed)
Refill will be sent for the patient as requested

## 2020-10-19 ENCOUNTER — Encounter: Payer: Self-pay | Admitting: Gynecologic Oncology

## 2020-10-19 ENCOUNTER — Other Ambulatory Visit: Payer: Self-pay

## 2020-10-19 ENCOUNTER — Inpatient Hospital Stay: Payer: Medicare Other | Attending: Gynecologic Oncology | Admitting: Gynecologic Oncology

## 2020-10-19 VITALS — BP 114/50 | HR 74 | Temp 97.0°F | Resp 18

## 2020-10-19 DIAGNOSIS — I110 Hypertensive heart disease with heart failure: Secondary | ICD-10-CM | POA: Diagnosis not present

## 2020-10-19 DIAGNOSIS — I480 Paroxysmal atrial fibrillation: Secondary | ICD-10-CM | POA: Diagnosis not present

## 2020-10-19 DIAGNOSIS — F419 Anxiety disorder, unspecified: Secondary | ICD-10-CM | POA: Diagnosis not present

## 2020-10-19 DIAGNOSIS — E039 Hypothyroidism, unspecified: Secondary | ICD-10-CM | POA: Insufficient documentation

## 2020-10-19 DIAGNOSIS — I509 Heart failure, unspecified: Secondary | ICD-10-CM | POA: Diagnosis not present

## 2020-10-19 DIAGNOSIS — Z87891 Personal history of nicotine dependence: Secondary | ICD-10-CM | POA: Insufficient documentation

## 2020-10-19 DIAGNOSIS — Z85528 Personal history of other malignant neoplasm of kidney: Secondary | ICD-10-CM | POA: Diagnosis not present

## 2020-10-19 DIAGNOSIS — B379 Candidiasis, unspecified: Secondary | ICD-10-CM | POA: Diagnosis not present

## 2020-10-19 DIAGNOSIS — F32A Depression, unspecified: Secondary | ICD-10-CM | POA: Insufficient documentation

## 2020-10-19 DIAGNOSIS — N8502 Endometrial intraepithelial neoplasia [EIN]: Secondary | ICD-10-CM | POA: Insufficient documentation

## 2020-10-19 DIAGNOSIS — Z7901 Long term (current) use of anticoagulants: Secondary | ICD-10-CM | POA: Insufficient documentation

## 2020-10-19 DIAGNOSIS — E119 Type 2 diabetes mellitus without complications: Secondary | ICD-10-CM | POA: Diagnosis not present

## 2020-10-19 DIAGNOSIS — Z87440 Personal history of urinary (tract) infections: Secondary | ICD-10-CM | POA: Insufficient documentation

## 2020-10-19 DIAGNOSIS — G35 Multiple sclerosis: Secondary | ICD-10-CM | POA: Insufficient documentation

## 2020-10-19 DIAGNOSIS — Z79899 Other long term (current) drug therapy: Secondary | ICD-10-CM | POA: Diagnosis not present

## 2020-10-19 DIAGNOSIS — Z86718 Personal history of other venous thrombosis and embolism: Secondary | ICD-10-CM | POA: Insufficient documentation

## 2020-10-19 MED ORDER — MONISTAT 1 COMBO PACK 1200 & 2 MG & % VA KIT: 1.0000 | PACK | Freq: Once | VAGINAL | 0 refills | Status: AC

## 2020-10-19 NOTE — Patient Instructions (Signed)
I will call you with biopsy results when I have them, likely later this week.  If biopsy is negative again, then I think we can move to biopsies every 6-9 months.  Please call though if you were to develop any new symptoms, such as vaginal bleeding or pelvic pain between visits.

## 2020-10-19 NOTE — Progress Notes (Signed)
Gynecologic Oncology Return Clinic Visit  10/19/2020  Reason for Visit: surveillance in the setting of hormonal treatment of CAH  Treatment History: Patient presented with vaginal discharge 01/08/20 - Pelvic ultrasound showed stable 3.5cm adnexal mass and thickened endometrial lining with large polyp noted on SIS 01/15/20 - hysteroscopy with polypectomy and D&C. Pathology: Complex atypical hyperplasia. Endometrial curettings: Scant endocervical type glandular tissue, insufficient endometrial tissue. 02/16/20 - D&C, Mirena IUD insertion. Pathology: benign inactive endometrium with focal secretory changes, no hyperplasia or carcinoma. 06/24/20 -endometrial biopsy showed fragmented endometrium with metaplastic changes.  No atypia or features of malignancy.  Glands are fragmented which somewhat hampers evaluation for hyperplasia.  Interval History: Patient reports overall doing well since her last visit with me.  She is continued to have significant issues with recurrent urinary tract infections.  She is now on probiotics and a new antibiotic.  In the last couple of days she is noticed a vaginal discharge and vulvar irritation which is consistent with prior yeast infections.  She notes having a good appetite without nausea or emesis.  She continues to have issues intermittently with constipation.  She denies any vaginal bleeding since I last saw her.    Past Medical/Surgical History: Past Medical History:  Diagnosis Date  . Abnormality of gait 03/01/2015  . Anxiety   . Arthritis    "knees" (01/19/2016)  . Asthma    as a child   . Back pain   . Bilateral carpal tunnel syndrome 10/04/2020  . Cancer ([place])    / RENAL CELL CARCINOMA - GETS CT SCAN EVERY 6 MONTHS TO WATCH   . CHF (congestive heart failure) ([place])   . Chronic pain    "nerve pain from the MS"  . Complex atypical endometrial hyperplasia   . Constipation   . Depression   . Diabetes mellitus without complication ([place])   . DVT (deep  venous thrombosis) ([place]) 1983   "RLE; may have just been phlebitis; it was before the age of dopplers"  . Dysrhythmia    afib   . 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 GERD (gastroesophageal reflux disease)   . HLD (hyperlipidemia)   . Hypertension   . Hypotension   . Hypothyroidism   . Joint pain   . Multiple sclerosis ([place])    has had this may 1989-[facility] reg doc  . OSA on CPAP    bipap machine - SLEEP APNEA WORSE SINCE COVID IN 1/21 PER PATIENT SETTING HAS INCREASED FROM 9 TO 30   . Osteoarthritis   . PAF (paroxysmal atrial fibrillation) ([place])    on coumadin; documented on monitor 06/2010  . Pneumonia 11/2011  . Sleep apnea     Past Surgical History:  Procedure Laterality Date  . ANTERIOR CERVICAL DECOMP/DISCECTOMY FUSION  01/2004  . CARDIAC CATHETERIZATION  06/22/2010   normal coronary arteries, PAF  . CATARACT EXTRACTION, BILATERAL Bilateral   . CHILD BIRTHS     X2  . DILATATION & CURETTAGE/HYSTEROSCOPY WITH MYOSURE N/A 01/15/2020   Procedure: DILATATION & CURETTAGE/HYSTEROSCOPY WITH MYOSURE;  Surgeon: Arvella Nigh, MD;  Location: [facility];  Service: Gynecology;  Laterality: N/A;  pt in wheelchair-has MS  . DILATION AND CURETTAGE OF UTERUS    . DILATION AND CURETTAGE OF UTERUS N/A 02/16/2020   Procedure: DILATATION AND CURETTAGE;  Surgeon: Lafonda Mosses, MD;  Location: [facility];  Service: Gynecology;  Laterality: N/A;  NOT GENERAL -  NO INTUBATION  . INFUSION PUMP IMPLANTATION  ~ 2009   baclofen infusion in lower abd  . INTRAUTERINE DEVICE (IUD) INSERTION N/A 02/16/2020   Procedure: INTRAUTERINE DEVICE (IUD) INSERTION - MIRENA;  Surgeon: Lafonda Mosses, MD;  Location: [facility];  Service: Gynecology;  Laterality: N/A;  . PAIN PUMP REVISION N/A 07/29/2014   Procedure: Baclofen pump replacement;  Surgeon: Erline Levine, MD;  Location: [facility];  Service: Neurosurgery;  Laterality: N/A;  Baclofen pump replacement  .  TONSILLECTOMY AND ADENOIDECTOMY    . VARICOSE VEIN SURGERY  ~ 1968  . VENOUS ABLATION  12/16/2010   radiofreq ablation -Dr Elisabeth Cara and [facility]  . WISDOM TOOTH EXTRACTION      Family History  Problem Relation Age of Onset  . Coronary artery disease Father        at age 80  . Hyperlipidemia Father   . Thyroid disease Father   . Coronary artery disease Maternal Grandmother   . Depression Maternal Grandmother   . Cancer Paternal Grandmother        breast  . Depression Mother   . Sudden death Mother   . Anxiety disorder Mother   . Alcoholism Son     Social History   Socioeconomic History  . Marital status: Married    Spouse name: [name]  . Number of children: 2  . Years of education: COLLEGE  . Highest education level: Not on file  Occupational History  . Occupation: RETIRED  Tobacco Use  . Smoking status: Former Smoker    Packs/day: 0.50    Years: 7.00    Pack years: 3.50    Types: Cigarettes    Quit date: 08/21/1976    Years since quitting: 44.1  . Smokeless tobacco: Never Used  Vaping Use  . Vaping Use: Never used  Substance and Sexual Activity  . Alcohol use: Not Currently  . Drug use: No  . Sexual activity: Not Currently  Other Topics Concern  . Not on file  Social History Narrative   Lives in [place] with Husband [name] [phone], [phone]   Right handed   Drinks a little caffeine in coffee sometimes    Social Determinants of Health   Financial Resource Strain: Not on file  Food Insecurity: Not on file  Transportation Needs: Not on file  Physical Activity: Not on file  Stress: Not on file  Social Connections: Not on file    Current Medications:  Current Outpatient Medications:  .  miconazole (MONISTAT 1 COMBO PACK) kit, Place 1 each vaginally once for 1 dose., Disp: 1 each, Rfl: 0 .  acetaminophen (TYLENOL) 500 MG tablet, Take 500-1,000 mg by mouth every 6 (six) hours as needed for fever or headache (or pain)., Disp: , Rfl:  .  ALPRAZolam (XANAX) 0.5 MG  tablet, TAKE 1 TABLET BY MOUTH 3  TIMES DAILY AS NEEDED FOR  ANXIETY AND SLEEP, Disp: 90 tablet, Rfl: 1 .  baclofen (LIORESAL) 20 MG tablet, Take 1 tablet (20 mg total) by mouth 2 (two) times daily as needed for muscle spasms., Disp: 180 tablet, Rfl: 4 .  BIOTIN PO, Take 1 tablet by mouth daily., Disp: , Rfl:  .  buPROPion (WELLBUTRIN XL) 150 MG 24 hr tablet, Take 1 tablet (150 mg total) by mouth daily., Disp: 90 tablet, Rfl: 1 .  Calcium Carb-Cholecalciferol (CALCIUM + D3 PO), Take 2 tablets by mouth in the morning and at bedtime., Disp: , Rfl:  .  cephALEXin (KEFLEX) 250 MG capsule, Take  250 mg by mouth daily., Disp: , Rfl:  .  cetirizine (ZYRTEC) 10 MG tablet, Take 10 mg by mouth daily as needed for allergies. , Disp: , Rfl:  .  Cholecalciferol (VITAMIN D3) 50 MCG (2000 UT) TABS, Take 2,000 Units by mouth in the morning and at bedtime., Disp: , Rfl:  .  Coenzyme Q10 (CO Q-10 PO), Take 1 capsule by mouth daily. , Disp: , Rfl:  .  corticotropin (ACTHAR) 80 UNIT/ML injectable gel, 10 days of 80 mg daily- one course -, Disp: 5 mL, Rfl: 5 .  diltiazem (CARDIZEM CD) 240 MG 24 hr capsule, Take 1 capsule (240 mg total) by mouth daily., Disp: 30 capsule, Rfl: 3 .  diltiazem (CARDIZEM) 60 MG tablet, Take 60 mg by mouth as needed., Disp: , Rfl:  .  docusate sodium (COLACE) 100 MG capsule, Take 1 capsule (100 mg total) by mouth daily. (Patient taking differently: Take 200 mg by mouth daily as needed for mild constipation.), Disp: 10 capsule, Rfl: 0 .  escitalopram (LEXAPRO) 10 MG tablet, Take 1 tablet (10 mg total) by mouth daily., Disp: 90 tablet, Rfl: 1 .  famotidine (PEPCID) 20 MG tablet, Take 20 mg by mouth at bedtime. , Disp: , Rfl: 6 .  furosemide (LASIX) 20 MG tablet, Take 40 mg by mouth in the morning and at bedtime. Change per 09/20/20 labs, Disp: , Rfl:  .  gabapentin (NEURONTIN) 300 MG capsule, TAKE 1 CAPSULE BY MOUTH UP  TO 4 TIMES DAILY (Patient taking differently: Take 300 mg by mouth every 6  (six) hours.), Disp: 360 capsule, Rfl: 3 .  hydrocortisone 2.5 % cream, Apply topically., Disp: , Rfl:  .  lactulose, encephalopathy, (GENERLAC) 10 GM/15ML SOLN, TAKE 15 ML BY MOUTH TWICE DAILY AS NEEDED (Patient taking differently: Take 10 g by mouth See admin instructions. Take 10 grams by mouth once a day and an additional 10 grams once daily, as needed to move the bowels), Disp: 2717 mL, Rfl: 1 .  levothyroxine (SYNTHROID, LEVOTHROID) 75 MCG tablet, Take 75 mcg by mouth daily before breakfast. , Disp: , Rfl:  .  linaclotide (LINZESS) 145 MCG CAPS capsule, Take 1 capsule (145 mcg total) by mouth daily before breakfast., Disp: 90 capsule, Rfl: 3 .  melatonin 5 MG TABS, Take 5 mg by mouth at bedtime., Disp: , Rfl:  .  midodrine (PROAMATINE) 5 MG tablet, Take 1 tablet (5 mg total) by mouth 3 (three) times daily with meals., Disp: 270 tablet, Rfl: 3 .  Omega-3 Fatty Acids (FISH OIL PO), Take 1 capsule by mouth daily., Disp: , Rfl:  .  omeprazole (PRILOSEC) 20 MG capsule, Take 20 mg by mouth daily before breakfast. , Disp: , Rfl:  .  ondansetron (ZOFRAN) 8 MG tablet, Take 8 mg by mouth every morning. , Disp: , Rfl:  .  oxybutynin (DITROPAN) 5 MG tablet, Take 5 mg by mouth 3 (three) times daily as needed for bladder spasms. , Disp: , Rfl:  .  potassium chloride SA (KLOR-CON) 20 MEQ tablet, Take 20 mEq by mouth daily., Disp: , Rfl:  .  psyllium (METAMUCIL SMOOTH TEXTURE) 28 % packet, Take 1 packet by mouth every evening. , Disp: , Rfl:  .  simvastatin (ZOCOR) 10 MG tablet, Take 10 mg by mouth every evening. , Disp: , Rfl:  .  SYRINGE/NEEDLE, DISP, 1 ML (BD LUER-LOK SYRINGE) 25G X 5/8" 1 ML MISC, Use as directed to inject  Acthar, Disp: 10 each, Rfl: prn .  Tamsulosin HCl (FLOMAX) 0.4 MG CAPS, Take 0.4 mg by mouth in the morning., Disp: , Rfl:  .  traMADol (ULTRAM) 50 MG tablet, Take 1 tablet by mouth every 8 (eight) hours as needed., Disp: , Rfl:  .  warfarin (COUMADIN) 5 MG tablet, TAKE 1/2 TO 1 TABLET  BY  MOUTH DAILY AS DIRECTED BY  COUMADIN CLINIC, Disp: 90 tablet, Rfl: 3  Review of Systems: Reports pain with urination, pelvic pain, vaginal discharge, pruritus. Denies appetite changes, fevers, chills, fatigue, unexplained weight changes. Denies hearing loss, neck lumps or masses, mouth sores, ringing in ears or voice changes. Denies cough or wheezing.  Denies shortness of breath. Denies chest pain or palpitations. Denies leg swelling. Denies abdominal distention, pain, blood in stools, constipation, diarrhea, nausea, vomiting, or early satiety. Denies pain with intercourse, frequency, hematuria or incontinence. Denies hot flashes, vaginal bleeding.   Denies joint pain, back pain or muscle pain/cramps. Denies rash, or wounds. Denies dizziness, headaches, numbness or seizures. Denies swollen lymph nodes or glands, denies easy bruising or bleeding. Denies anxiety, depression, confusion, or decreased concentration.  Physical Exam: BP (!) 114/50 (BP Location: Right Arm, Patient Position: Sitting)   Pulse 74   Temp (!) 97 F (36.1 C) (Tympanic)   Resp 18   SpO2 100%  General: Alert, oriented, no acute distress. HEENT: Atraumatic, normocephalic, sclera anicteric. Chest: Unlabored breathing on room air Abdomen: Obese, soft, nontender.  Pump appreciated in right lower quadrant underneath skin.  Normoactive bowel sounds.  No hepatosplenomegaly appreciated.    Lymphatics: No cervical, supraclavicular, or inguinal adenopathy. GU: Normal appearing external genitalia without excoriation, or lesions.  Vulva is mildly irritated.  Speculum exam reveals thick white discharge consistent with candidiasis.  IUD strings visible protruding approximately 3-4 cm from the cervical os.  Bimanual exam reveals mobile uterus, no tenderness.    Procedure: Endometrial biopsy Preoperative diagnosis: Complex atypical hyperplasia Postoperative diagnosis: Same as above Provider: Berline Lopes MD Specimens: Endometrial  biopsy Procedure: After verbal consent was obtained from the patient and the risks, benefits, and alternatives of the procedure were reviewed, the patient was placed in dorsal lithotomy position.  Speculum was placed in the vagina and the cervix was well visualized.  Betadine was used x3 to cleanse the cervix.  A single-tooth tenaculum was placed on the anterior lip of the cervix.  A an endometrial Pipelle was then passed to a depth of just over 6 cm and 2 passes were performed.  Scant tissue was obtained and placed in formalin to be sent to pathology.  Instruments were removed from the vagina after hemostasis was assured.  Overall the patient tolerated the procedure well.  Laboratory & Radiologic Studies: None new  Assessment & Plan: GLEE LASHOMB is a 75 y.o. woman with a complex past medical history found to have complex atypical hyperplasia, likely combined to an endometrial polyp, presenting for follow-up after 8 months of progesterone therapy with a Mirena IUD.  Patient is doing well from a gynecologic standpoint.    She has had no bleeding since her procedure.  She has had several days of discharge which seem most consistent with a yeast infection.  I sent in a prescription for Monistat which she has used in the past with good results.  She knows the contraindications to this type of medication while she is on her warfarin.  I think it is reasonable for her to take treatment either in the form of Monistat or a dose of Diflucan.  I have asked  her to call me if the Monistat does not clear up her symptoms.    I repeated a biopsy today.  If this biopsy is negative, I think we can move to the biopsies less frequently.  We discussed repeating a biopsy in 6-9 months if this is the case.    The patient knows to contact my office if she develops any vaginal bleeding or other symptoms prior to her next scheduled visit.  32 minutes of total time was spent for this patient encounter, including  preparation, face-to-face counseling with the patient and coordination of care, and documentation of the encounter.  Jeral Pinch, MD  Division of Gynecologic Oncology  Department of Obstetrics and Gynecology  [facility]

## 2020-10-20 ENCOUNTER — Other Ambulatory Visit: Payer: Self-pay

## 2020-10-20 DIAGNOSIS — B379 Candidiasis, unspecified: Secondary | ICD-10-CM

## 2020-10-20 DIAGNOSIS — J9621 Acute and chronic respiratory failure with hypoxia: Secondary | ICD-10-CM | POA: Diagnosis not present

## 2020-10-20 DIAGNOSIS — I110 Hypertensive heart disease with heart failure: Secondary | ICD-10-CM | POA: Diagnosis not present

## 2020-10-20 DIAGNOSIS — J45909 Unspecified asthma, uncomplicated: Secondary | ICD-10-CM | POA: Diagnosis not present

## 2020-10-20 DIAGNOSIS — G35 Multiple sclerosis: Secondary | ICD-10-CM | POA: Diagnosis not present

## 2020-10-20 DIAGNOSIS — Z87440 Personal history of urinary (tract) infections: Secondary | ICD-10-CM | POA: Diagnosis not present

## 2020-10-20 DIAGNOSIS — I509 Heart failure, unspecified: Secondary | ICD-10-CM | POA: Diagnosis not present

## 2020-10-20 LAB — SURGICAL PATHOLOGY

## 2020-10-20 MED ORDER — MICONAZOLE NITRATE 1200 & 2 MG & % VA KIT: 1.0000 | PACK | Freq: Once | VAGINAL | 0 refills | Status: AC

## 2020-10-20 NOTE — Telephone Encounter (Signed)
Toni [name] need's prescription sent to [facility] on [address] as [employer] are down and they cannot fil prescriptions.

## 2020-10-21 ENCOUNTER — Telehealth: Payer: Self-pay | Admitting: Neurology

## 2020-10-21 ENCOUNTER — Telehealth: Payer: Self-pay | Admitting: Gynecologic Oncology

## 2020-10-21 NOTE — Telephone Encounter (Signed)
I called pt. I advised pt that Dr. Brett Fairy reviewed their sleep study results and found that pt was best treated with ASV machine and oxygen at 5 L. Dr. Brett Fairy recommends that pt starts ASV 13/12/4 cm water pressure with 5 Liters of oxygen. I reviewed PAP compliance expectations with the pt. Pt is agreeable to starting a ASV. I advised pt that an order will be sent to a DME, [facility] ([place])., and [facility] ([facility] will call the pt within about one week after they file with the pt's insurance. [facility] will show the pt how to use the machine, fit for masks, and troubleshoot the ASV if needed. A follow up appt was made for insurance purposes with Dr. Brett Fairy on June 21,2022 at 3:30 pm. Pt verbalized understanding to arrive 15 minutes early and bring their ASV. A letter with all of this information in it will be mailed to the pt as a reminder. I verified with the pt that the address we have on file is correct. Pt verbalized understanding of results. Pt had no questions at this time but was encouraged to call back if questions arise. I have sent the order to [facility]) and have received confirmation that they have received the order.

## 2020-10-21 NOTE — Procedures (Signed)
PATIENT'S NAME:  Toni [name], Toni [name] DOB:      08-Aug-1946      MR#:    [id number] B. Handy    DATE OF RECORDING: 10/11/2020 REFERRING M.D.:  Prince Solian MD Study Performed:   Adaptive Servo Ventilation-Titration HISTORY:  We are discussing her high AHi on BiPAP, also she is very compliant with additional 3 liters of oxygen, she will have to re-qualify for oxygen, I need her to come in for in lab study, but she is in need of a medical bed. Since she contracted COVID in a rehab facility she has lost weight, but has SOB more often, and she is not doing well with the PAP mask.  She feels she needs more ACTHAR shots.   She has had a baclofen pump and there was enough baclofen in the pump yet she remains in pain.  She has fluid retention- Dr Debara Pickett, diagnosed her CHF related anasarca. belly swelling and spasms in abdomen. She is on diuretics and potassium.  OT wants a new NCV. She had thenar atrophy- left hand. Dr [name] stated this is Carpal tunnel- her hand surgeon wants documentation.  Complex sleep apnea on BiPAP: Download indicates compliance of 100%.   On average she uses it 8 hours and 50 minutes.  Her residual AHI is 21.5 on 16/11 centimeters of water.  She reports that she had an overnight pulse oximetry test with her pulmonologist that showed desaturations despite being on 3 L of oxygen at night.  The patient endorsed the Epworth Sleepiness Scale at 12-14 points.   The patient's weight 207 pounds with a height of 62 (inches), resulting in a BMI of 38.1 kg/m2. The patient's neck circumference measured 18 inches.  CURRENT MEDICATIONS: Tylenol, Acthar, Xanax, Amoxil, Lioresal, Biotin, Wellbutrin, Calcium, Zyrtec, Vitamin D, CoQ10, Cardizem, Colace, Pepcid, Lasix, Neurontin, Generlac, Levothroid, Linzess, Melatonin, Proamatine, Prilosec, Zofran, Ditropan, Klor-Con, Zocor  PROCEDURE:  This is a multichannel digital polysomnogram utilizing the SomnoStar 11.2 system.  Electrodes and sensors were applied  and monitored per AASM Specifications.   EEG, EOG, Chin and Limb EMG, were sampled at 200 Hz.  ECG, Snore and Nasal Pressure, Thermal Airflow, Respiratory Effort, CPAP Flow and Pressure, Oximetry was sampled at 50 Hz. Digital video and audio were recorded.      BiPAP failed to control the complex and mostly central sleep apnea- 12/8 cm was initiated pressure with heated humidity per AASM standards and pressure was advanced to 20/15 (!) cm water - nadir remained low at 81 %. ASV at 13 /12/ 4 did elevate nadir to 85% and helped with AHI reduction to 5.0/h. oxygen was needed at 5 liters.   Lights Out was at 23:04 and Lights On at 05:09. Total recording time (TRT) was 366 minutes, with a total sleep time (TST) of 336.5 minutes. The patient's sleep latency was 10.5 minutes. REM latency was 204 minutes.  The sleep efficiency was 91.9 %.    SLEEP ARCHITECTURE: WASO (Wake after sleep onset) was 29.5 minutes.  There were 10.5 minutes in Stage N1, 158.5 minutes Stage N2, 115.5 minutes Stage N3 and 52 minutes in Stage REM.  The percentage of Stage N1 was 3.1%, Stage N2 was 47.1%, Stage N3 was 34.3% and Stage R (REM sleep) was 15.5%.   RESPIRATORY ANALYSIS:  There was a total of 94 respiratory events: 40 obstructive apneas, 43 central apneas and 0 mixed apneas with a total of 83 apneas and an apnea index (AI) of 14.8 /hour. There were 11 hypopneas  with a hypopnea index of 2./hour.   The total APNEA/HYPOPNEA INDEX  (AHI) was 16.8 /hour and the total RESPIRATORY DISTURBANCE INDEX was 16.8 /hour  1 events occurred in REM sleep and 93 events in NREM. The REM AHI was 1.2 /hour versus a non-REM AHI of 19.6 /hour.  The patient spent 336.5 minutes of total sleep time in the supine position and 0 minutes in non-supine. The supine AHI was 16.8, versus a non-supine AHI of 0.0.  OXYGEN SATURATION & C02:  The baseline 02 saturation was 89%, with the lowest being 71%. Time spent below 89% saturation equaled 64 minutes.  The  arousals were noted as: 39 were spontaneous, 0 were associated with PLMs, 58 were associated with respiratory events. The patient had a total of 0 Periodic Limb Movements.   Audio and video analysis did not show any abnormal or unusual movements, behaviors, phonations or vocalizations.   EKG was in persistent atrial fibrillation.  The patient was fitted with a -FFM- mask, can chose F 30 I versus Vitera.  DIAGNOSIS 1. 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 2.  BiPAP ST also failed.  3. Best result was ASV at 13/12/0/4 cm water, 5 liters of oxygen were needed and a FFM used. .  4. PLANS/RECOMMENDATIONS: Best result was ASV at 13/12/0/4 cm water, 5 liters of oxygen were needed and a FFM used. 5.    DISCUSSION: A follow up appointment will be scheduled in the Sleep Clinic at [facility].   Please call [phone] with any questions.     I certify that I have reviewed the entire raw data recording prior to the issuance of this report in accordance with the Standards of Accreditation of the American Academy of Sleep Medicine (AASM)  [name], M.D. Diplomat, Tax adviser of Neurology  Diplomat, Tax adviser of Sleep Medicine Market researcher, [employer] [facility] at [employer], [employer] of Neurology and Sleep Medicine ( Neurology and Sleep Medicine)

## 2020-10-21 NOTE — Telephone Encounter (Signed)
Called and reviewed biopsy results. Plan will be to repeat a biopsy in 9 months unless something changes (vaginal bleeding, etc) before then. Patient asked to call in September to get December visit scheduled.  Jeral Pinch MD Gynecologic Oncology

## 2020-10-21 NOTE — Addendum Note (Signed)
Addended by: [name] on: 10/21/2020 02:25 PM   Modules accepted: Orders

## 2020-10-21 NOTE — Telephone Encounter (Signed)
-----   Message from [name], MD sent at 10/21/2020  2:24 PM EST ----- DIAGNOSIS 1. 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 2.  BiPAP ST also failed to control complex apnea and hypoxemia.  3. Best result was ASV at 13/12/0/4 cm water, we added 5 liters of oxygen and a FFM was used.  4. PLANS/RECOMMENDATIONS: Best result was ASV at 13/12/0/4 cm water, 5 liters of oxygen were needed and a FFM used. EKG was in persistent atrial fibrillation.  The patient was using a -FFM- mask, can chose F 30 I versus Vitera.

## 2020-10-21 NOTE — Progress Notes (Signed)
DIAGNOSIS 1. 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 2.  BiPAP ST also failed to control complex apnea and hypoxemia.  3. Best result was ASV at 13/12/0/4 cm water, we added 5 liters of oxygen and a FFM was used.  4. PLANS/RECOMMENDATIONS: Best result was ASV at 13/12/0/4 cm water, 5 liters of oxygen were needed and a FFM used. EKG was in persistent atrial fibrillation.  The patient was using a -FFM- mask, can chose F 30 I versus Vitera.

## 2020-10-22 DIAGNOSIS — I509 Heart failure, unspecified: Secondary | ICD-10-CM | POA: Diagnosis not present

## 2020-10-22 DIAGNOSIS — Z87440 Personal history of urinary (tract) infections: Secondary | ICD-10-CM | POA: Diagnosis not present

## 2020-10-22 DIAGNOSIS — J45909 Unspecified asthma, uncomplicated: Secondary | ICD-10-CM | POA: Diagnosis not present

## 2020-10-22 DIAGNOSIS — G35 Multiple sclerosis: Secondary | ICD-10-CM | POA: Diagnosis not present

## 2020-10-22 DIAGNOSIS — I110 Hypertensive heart disease with heart failure: Secondary | ICD-10-CM | POA: Diagnosis not present

## 2020-10-22 DIAGNOSIS — J9621 Acute and chronic respiratory failure with hypoxia: Secondary | ICD-10-CM | POA: Diagnosis not present

## 2020-10-26 DIAGNOSIS — G35 Multiple sclerosis: Secondary | ICD-10-CM | POA: Diagnosis not present

## 2020-10-26 DIAGNOSIS — Z961 Presence of intraocular lens: Secondary | ICD-10-CM | POA: Diagnosis not present

## 2020-10-26 DIAGNOSIS — Z9841 Cataract extraction status, right eye: Secondary | ICD-10-CM | POA: Diagnosis not present

## 2020-10-26 DIAGNOSIS — H3581 Retinal edema: Secondary | ICD-10-CM | POA: Diagnosis not present

## 2020-10-26 DIAGNOSIS — Z9842 Cataract extraction status, left eye: Secondary | ICD-10-CM | POA: Diagnosis not present

## 2020-10-26 DIAGNOSIS — H4423 Degenerative myopia, bilateral: Secondary | ICD-10-CM | POA: Diagnosis not present

## 2020-10-27 ENCOUNTER — Ambulatory Visit (INDEPENDENT_AMBULATORY_CARE_PROVIDER_SITE_OTHER): Payer: Medicare Other | Admitting: Cardiology

## 2020-10-27 DIAGNOSIS — Z7901 Long term (current) use of anticoagulants: Secondary | ICD-10-CM

## 2020-10-27 DIAGNOSIS — N3000 Acute cystitis without hematuria: Secondary | ICD-10-CM | POA: Diagnosis not present

## 2020-10-27 DIAGNOSIS — J9621 Acute and chronic respiratory failure with hypoxia: Secondary | ICD-10-CM | POA: Diagnosis not present

## 2020-10-27 DIAGNOSIS — Z87440 Personal history of urinary (tract) infections: Secondary | ICD-10-CM | POA: Diagnosis not present

## 2020-10-27 DIAGNOSIS — I110 Hypertensive heart disease with heart failure: Secondary | ICD-10-CM | POA: Diagnosis not present

## 2020-10-27 DIAGNOSIS — J45909 Unspecified asthma, uncomplicated: Secondary | ICD-10-CM | POA: Diagnosis not present

## 2020-10-27 DIAGNOSIS — I509 Heart failure, unspecified: Secondary | ICD-10-CM | POA: Diagnosis not present

## 2020-10-27 DIAGNOSIS — G35 Multiple sclerosis: Secondary | ICD-10-CM | POA: Diagnosis not present

## 2020-10-27 LAB — POCT INR: INR: 2.2 (ref 2.0–3.0)

## 2020-10-28 DIAGNOSIS — G35 Multiple sclerosis: Secondary | ICD-10-CM | POA: Diagnosis not present

## 2020-10-28 DIAGNOSIS — Z87440 Personal history of urinary (tract) infections: Secondary | ICD-10-CM | POA: Diagnosis not present

## 2020-10-28 DIAGNOSIS — I110 Hypertensive heart disease with heart failure: Secondary | ICD-10-CM | POA: Diagnosis not present

## 2020-10-28 DIAGNOSIS — I509 Heart failure, unspecified: Secondary | ICD-10-CM | POA: Diagnosis not present

## 2020-10-28 DIAGNOSIS — J9621 Acute and chronic respiratory failure with hypoxia: Secondary | ICD-10-CM | POA: Diagnosis not present

## 2020-10-28 DIAGNOSIS — J45909 Unspecified asthma, uncomplicated: Secondary | ICD-10-CM | POA: Diagnosis not present

## 2020-11-01 ENCOUNTER — Ambulatory Visit (INDEPENDENT_AMBULATORY_CARE_PROVIDER_SITE_OTHER): Payer: Medicare Other | Admitting: Internal Medicine

## 2020-11-01 DIAGNOSIS — J45909 Unspecified asthma, uncomplicated: Secondary | ICD-10-CM | POA: Diagnosis not present

## 2020-11-01 DIAGNOSIS — Z7901 Long term (current) use of anticoagulants: Secondary | ICD-10-CM | POA: Diagnosis not present

## 2020-11-01 DIAGNOSIS — G35 Multiple sclerosis: Secondary | ICD-10-CM | POA: Diagnosis not present

## 2020-11-01 DIAGNOSIS — J9621 Acute and chronic respiratory failure with hypoxia: Secondary | ICD-10-CM | POA: Diagnosis not present

## 2020-11-01 DIAGNOSIS — I110 Hypertensive heart disease with heart failure: Secondary | ICD-10-CM | POA: Diagnosis not present

## 2020-11-01 DIAGNOSIS — Z87440 Personal history of urinary (tract) infections: Secondary | ICD-10-CM | POA: Diagnosis not present

## 2020-11-01 DIAGNOSIS — I509 Heart failure, unspecified: Secondary | ICD-10-CM | POA: Diagnosis not present

## 2020-11-01 LAB — POCT INR: INR: 2.1 (ref 2.0–3.0)

## 2020-11-03 ENCOUNTER — Encounter (HOSPITAL_COMMUNITY): Payer: Self-pay | Admitting: *Deleted

## 2020-11-03 ENCOUNTER — Emergency Department (HOSPITAL_COMMUNITY): Payer: Medicare Other

## 2020-11-03 ENCOUNTER — Emergency Department (HOSPITAL_COMMUNITY)
Admission: EM | Admit: 2020-11-03 | Discharge: 2020-11-03 | Disposition: A | Discharge status: Home / Self Care | Payer: Medicare Other | Attending: Emergency Medicine | Admitting: Emergency Medicine

## 2020-11-03 ENCOUNTER — Other Ambulatory Visit: Payer: Self-pay

## 2020-11-03 DIAGNOSIS — Z859 Personal history of malignant neoplasm, unspecified: Secondary | ICD-10-CM | POA: Insufficient documentation

## 2020-11-03 DIAGNOSIS — E039 Hypothyroidism, unspecified: Secondary | ICD-10-CM | POA: Insufficient documentation

## 2020-11-03 DIAGNOSIS — R079 Chest pain, unspecified: Secondary | ICD-10-CM | POA: Diagnosis not present

## 2020-11-03 DIAGNOSIS — Z7901 Long term (current) use of anticoagulants: Secondary | ICD-10-CM | POA: Diagnosis not present

## 2020-11-03 DIAGNOSIS — I5032 Chronic diastolic (congestive) heart failure: Secondary | ICD-10-CM | POA: Diagnosis not present

## 2020-11-03 DIAGNOSIS — J45909 Unspecified asthma, uncomplicated: Secondary | ICD-10-CM | POA: Insufficient documentation

## 2020-11-03 DIAGNOSIS — I959 Hypotension, unspecified: Secondary | ICD-10-CM | POA: Diagnosis not present

## 2020-11-03 DIAGNOSIS — R0789 Other chest pain: Secondary | ICD-10-CM | POA: Diagnosis not present

## 2020-11-03 DIAGNOSIS — I774 Celiac artery compression syndrome: Secondary | ICD-10-CM | POA: Diagnosis not present

## 2020-11-03 DIAGNOSIS — R109 Unspecified abdominal pain: Secondary | ICD-10-CM | POA: Diagnosis not present

## 2020-11-03 DIAGNOSIS — R410 Disorientation, unspecified: Secondary | ICD-10-CM | POA: Insufficient documentation

## 2020-11-03 DIAGNOSIS — I110 Hypertensive heart disease with heart failure: Secondary | ICD-10-CM | POA: Insufficient documentation

## 2020-11-03 DIAGNOSIS — M2550 Pain in unspecified joint: Secondary | ICD-10-CM | POA: Diagnosis not present

## 2020-11-03 DIAGNOSIS — N289 Disorder of kidney and ureter, unspecified: Secondary | ICD-10-CM

## 2020-11-03 DIAGNOSIS — R0902 Hypoxemia: Secondary | ICD-10-CM | POA: Diagnosis not present

## 2020-11-03 DIAGNOSIS — N390 Urinary tract infection, site not specified: Secondary | ICD-10-CM | POA: Diagnosis not present

## 2020-11-03 DIAGNOSIS — Z8616 Personal history of COVID-19: Secondary | ICD-10-CM | POA: Diagnosis not present

## 2020-11-03 DIAGNOSIS — Z87891 Personal history of nicotine dependence: Secondary | ICD-10-CM | POA: Diagnosis not present

## 2020-11-03 DIAGNOSIS — I480 Paroxysmal atrial fibrillation: Secondary | ICD-10-CM | POA: Diagnosis not present

## 2020-11-03 DIAGNOSIS — I517 Cardiomegaly: Secondary | ICD-10-CM | POA: Diagnosis not present

## 2020-11-03 DIAGNOSIS — E119 Type 2 diabetes mellitus without complications: Secondary | ICD-10-CM | POA: Diagnosis not present

## 2020-11-03 DIAGNOSIS — Z79899 Other long term (current) drug therapy: Secondary | ICD-10-CM | POA: Insufficient documentation

## 2020-11-03 DIAGNOSIS — I771 Stricture of artery: Secondary | ICD-10-CM | POA: Diagnosis not present

## 2020-11-03 DIAGNOSIS — R5381 Other malaise: Secondary | ICD-10-CM | POA: Diagnosis not present

## 2020-11-03 DIAGNOSIS — I4891 Unspecified atrial fibrillation: Secondary | ICD-10-CM | POA: Diagnosis not present

## 2020-11-03 DIAGNOSIS — Z7401 Bed confinement status: Secondary | ICD-10-CM | POA: Diagnosis not present

## 2020-11-03 DIAGNOSIS — R001 Bradycardia, unspecified: Secondary | ICD-10-CM | POA: Diagnosis not present

## 2020-11-03 DIAGNOSIS — R42 Dizziness and giddiness: Secondary | ICD-10-CM | POA: Diagnosis not present

## 2020-11-03 LAB — URINALYSIS, ROUTINE W REFLEX MICROSCOPIC
Bilirubin Urine: NEGATIVE
Glucose, UA: NEGATIVE mg/dL
Hgb urine dipstick: NEGATIVE
Ketones, ur: NEGATIVE mg/dL
Nitrite: NEGATIVE
Protein, ur: NEGATIVE mg/dL
Specific Gravity, Urine: 1.017 (ref 1.005–1.030)
WBC, UA: >50 WBC/hpf — ABNORMAL HIGH (ref 0–5)
pH: 6 (ref 5.0–8.0)

## 2020-11-03 LAB — CBC WITH DIFFERENTIAL/PLATELET
Abs Immature Granulocytes: 0.02 10*3/uL (ref 0.00–0.07)
Basophils Absolute: 0.1 10*3/uL (ref 0.0–0.1)
Basophils Relative: 1 %
Eosinophils Absolute: 0.3 10*3/uL (ref 0.0–0.5)
Eosinophils Relative: 4 %
HCT: 39.2 % (ref 36.0–46.0)
Hemoglobin: 12.8 g/dL (ref 12.0–15.0)
Immature Granulocytes: 0 %
Lymphocytes Relative: 30 %
Lymphs Abs: 2.4 10*3/uL (ref 0.7–4.0)
MCH: 31.4 pg (ref 26.0–34.0)
MCHC: 32.7 g/dL (ref 30.0–36.0)
MCV: 96.3 fL (ref 80.0–100.0)
Monocytes Absolute: 0.9 10*3/uL (ref 0.1–1.0)
Monocytes Relative: 12 %
Neutro Abs: 4.2 10*3/uL (ref 1.7–7.7)
Neutrophils Relative %: 53 %
Platelets: 164 10*3/uL (ref 150–400)
RBC: 4.07 MIL/uL (ref 3.87–5.11)
RDW: 12.8 % (ref 11.5–15.5)
WBC: 8 10*3/uL (ref 4.0–10.5)
nRBC: 0 % (ref 0.0–0.2)

## 2020-11-03 LAB — I-STAT CHEM 8, ED
BUN: 26 mg/dL — ABNORMAL HIGH (ref 8–23)
Calcium, Ion: 1.06 mmol/L — ABNORMAL LOW (ref 1.15–1.40)
Chloride: 97 mmol/L — ABNORMAL LOW (ref 98–111)
Creatinine, Ser: 0.6 mg/dL (ref 0.44–1.00)
Glucose, Bld: 88 mg/dL (ref 70–99)
HCT: 38 % (ref 36.0–46.0)
Hemoglobin: 12.9 g/dL (ref 12.0–15.0)
Potassium: 3.6 mmol/L (ref 3.5–5.1)
Sodium: 142 mmol/L (ref 135–145)
TCO2: 34 mmol/L — ABNORMAL HIGH (ref 22–32)

## 2020-11-03 LAB — COMPREHENSIVE METABOLIC PANEL
ALT: 14 U/L (ref 0–44)
AST: 15 U/L (ref 15–41)
Albumin: 3.2 g/dL — ABNORMAL LOW (ref 3.5–5.0)
Alkaline Phosphatase: 51 U/L (ref 38–126)
Anion gap: 3 — ABNORMAL LOW (ref 5–15)
BUN: 21 mg/dL (ref 8–23)
CO2: 37 mmol/L — ABNORMAL HIGH (ref 22–32)
Calcium: 8.5 mg/dL — ABNORMAL LOW (ref 8.9–10.3)
Chloride: 99 mmol/L (ref 98–111)
Creatinine, Ser: 0.64 mg/dL (ref 0.44–1.00)
GFR, Estimated: >60 mL/min (ref >60)
Glucose, Bld: 97 mg/dL (ref 70–99)
Potassium: 3.6 mmol/L (ref 3.5–5.1)
Sodium: 139 mmol/L (ref 135–145)
Total Bilirubin: 0.7 mg/dL (ref 0.3–1.2)
Total Protein: 5.7 g/dL — ABNORMAL LOW (ref 6.5–8.1)

## 2020-11-03 LAB — TROPONIN I (HIGH SENSITIVITY)
Troponin I (High Sensitivity): 8 ng/L (ref <18)
Troponin I (High Sensitivity): 7 ng/L (ref <18)

## 2020-11-03 LAB — PROTIME-INR
INR: 2.6 — ABNORMAL HIGH (ref 0.8–1.2)
Prothrombin Time: 26.8 seconds — ABNORMAL HIGH (ref 11.4–15.2)

## 2020-11-03 LAB — BRAIN NATRIURETIC PEPTIDE: B Natriuretic Peptide: 154.5 pg/mL — ABNORMAL HIGH (ref 0.0–100.0)

## 2020-11-03 MED ORDER — IOHEXOL 350 MG/ML SOLN
100.0000 mL | Freq: Once | INTRAVENOUS | Status: AC | PRN
  Administered 2020-11-03: 100 mL via INTRAVENOUS

## 2020-11-03 MED ORDER — ACETAMINOPHEN 325 MG PO TABS
650.0000 mg | ORAL_TABLET | Freq: Once | ORAL | Status: AC
  Administered 2020-11-03: 650 mg via ORAL
  Filled 2020-11-03: qty 2

## 2020-11-03 MED ORDER — METHYLPREDNISOLONE SODIUM SUCC 125 MG IJ SOLR: 125.0000 mg | Freq: Once | INTRAMUSCULAR | Status: DC

## 2020-11-03 MED ORDER — SODIUM CHLORIDE 0.9 % IV SOLN
1.0000 g | Freq: Once | INTRAVENOUS | Status: AC
  Administered 2020-11-03: 1 g via INTRAVENOUS
  Filled 2020-11-03: qty 10

## 2020-11-03 MED ORDER — ACETAMINOPHEN 325 MG PO TABS: 325.0000 mg | ORAL_TABLET | Freq: Once | ORAL | Status: DC

## 2020-11-03 NOTE — ED Notes (Signed)
Secretary to call [facility] at this time.

## 2020-11-03 NOTE — Discharge Instructions (Addendum)
You still have a urinary tract infection.  Increase Keflex to 500 mg 3 times daily for a week.  Consider following up with urologist  Your heart enzyme tests are normal.   You may have a lesion or cyst in your kidney that needs to be followed up with MRI to see what it is exactly.  You can also mention that to your urologist  Return to ER if you have worse chest pain, fever, trouble urinating

## 2020-11-03 NOTE — ED Triage Notes (Addendum)
Pt here via [facility] from home for central chest pain with inspiration that radiates to central back.  Pt fell to sleep last night in her chair without her c-pap and woke up confused with sats in her 70's until she was placed on her bipap.  She's also being tx for CT with cipro.  Per pt, took increased lasix for decreased urine output last night.

## 2020-11-03 NOTE — ED Notes (Signed)
PT[facility] at bedside at this time

## 2020-11-03 NOTE — ED Provider Notes (Signed)
[facility] EMERGENCY DEPARTMENT Provider Note   CSN: [id number] Arrival date & time: 11/03/20  1521     History Chief Complaint  Patient presents with  . Chest Pain    Toni [name] is a 75 y.o. female history diabetes, DVT and A. fib on Coumadin, here presenting with altered mental status and chest pain and back pain.  Patient gets recurrent urinary tract infections and is on Cipro for since last week.  Patient was noted to be confused yesterday and her oxygen saturation was apparently in the 70s.  She states that her aide woke her up and her oxygen saturation came up.  Patient states that this afternoon she has acute onset of chest pain radiated to her back.  She states that she is still having some chest pressure right now.   The history is provided by the patient.       Past Medical History:  Diagnosis Date  . Abnormality of gait 03/01/2015  . Anxiety   . Arthritis    "knees" (01/19/2016)  . Asthma    as a child   . Back pain   . Bilateral carpal tunnel syndrome 10/04/2020  . Cancer ([place])    / RENAL CELL CARCINOMA - GETS CT SCAN EVERY 6 MONTHS TO WATCH   . CHF (congestive heart failure) ([place])   . Chronic pain    "nerve pain from the MS"  . Complex atypical endometrial hyperplasia   . Constipation   . Depression   . Diabetes mellitus without complication ([place])   . DVT (deep venous thrombosis) ([place]) 1983   "RLE; may have just been phlebitis; it was before the age of dopplers"  . Dysrhythmia    afib   . 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 GERD (gastroesophageal reflux disease)   . HLD (hyperlipidemia)   . Hypertension   . Hypotension   . Hypothyroidism   . Joint pain   . Multiple sclerosis ([place])    has had this may 1989-[facility]me[facility] reg doc  . OSA on CPAP    bipap machine - SLEEP APNEA WORSE SINCE COVID IN 1/21 PER PATIENT SETTING HAS INCREASED FROM 9 TO 30   . Osteoarthritis   . PAF  (paroxysmal atrial fibrillation) ([place])    on coumadin; documented on monitor 06/2010  . Pneumonia 11/2011  . Sleep apnea     Patient Active Problem List   Diagnosis Date Noted  . Bilateral carpal tunnel syndrome 10/04/2020  . 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 Chronic intermittent hypoxia with obstructive sleep apnea 09/13/2020  . COVID-19 long hauler manifesting chronic dyspnea 09/13/2020  . 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 Chronic diastolic CHF (congestive heart failure) ([place]) 09/13/2020  . Acute respiratory failure with hypoxia ([place]) 09/13/2020  . Class 2 severe obesity with serious comorbidity and body mass index (BMI) of 35.0 to 35.9 in adult [facility]) 09/13/2020  . Sepsis ([place]) 06/04/2020  . Acute metabolic encephalopathy 32/95/1884  . Atrial fibrillation with RVR ([place]) 04/07/2020  . Recurrent UTI 04/07/2020  . Uncomplicated degenerative myopia of both eyes 03/19/2020  . 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 Oxygen dependent 02/26/2020  . Neurogenic hypoventilation 02/26/2020  . Complex atypical endometrial hyperplasia   . Acute on chronic respiratory failure with hypoxia ([place]) 10/10/2019  . Multiple sclerosis ([place]) 10/10/2019  . COVID-19 virus infection 08/23/2019  . Bilateral leg  weakness 08/04/2019  . Weakness 08/04/2019  . Unilateral primary osteoarthritis, left knee 01/09/2019  . Type 2 diabetes mellitus without complication, without long-term current use of insulin ([place]) 08/27/2018  . Obstructive sleep apnea treated with bilevel positive airway pressure (BiPAP) 08/15/2018  . 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 ([place]) 11/21/2017  . Permanent atrial fibrillation ([place]) 11/21/2017  . Tightness of leg fascia 11/21/2017  . Muscle spasm of both lower legs 11/21/2017  . Carpal tunnel syndrome, left upper limb 09/13/2017  . Carpal tunnel syndrome, right upper limb 09/13/2017  . Other insomnia 06/11/2017  .  Depression 06/11/2017  . Vitamin D deficiency 03/22/2017  . Right hand weakness 08/06/2016  . Nonischemic cardiomyopathy ([place]) 05/09/2016  . Localized edema 05/09/2016  . Long term current use of anticoagulant therapy 02/03/2016  . 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 Obesity hypoventilation syndrome ([place]) 03/30/2015  . Abnormality of gait 03/01/2015  . Chronic constipation with overflow incontinence 01/25/2015  . 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) ([place]) 01/25/2015  . 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 Varicose veins of both lower extremities with complications 38/05/1750  . Long term (current) use of anticoagulants 11/05/2012  . Pseudophakia of both eyes 02/07/2012  . 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 Essential hypertension   . Atrial fibrillation ([place])   . Thyroid disease   . HLD (hyperlipidemia)     Past Surgical History:  Procedure Laterality Date  . ANTERIOR CERVICAL DECOMP/DISCECTOMY FUSION  01/2004  . CARDIAC CATHETERIZATION  06/22/2010   normal coronary arteries, PAF  . CATARACT EXTRACTION, BILATERAL Bilateral   . CHILD BIRTHS     X2  . DILATATION & CURETTAGE/HYSTEROSCOPY WITH MYOSURE N/A 01/15/2020   Procedure: DILATATION & CURETTAGE/HYSTEROSCOPY WITH MYOSURE;  Surgeon: Arvella Nigh, MD;  Location: [facility];  Service: Gynecology;  Laterality: N/A;  pt in wheelchair-has MS  . DILATION AND CURETTAGE OF UTERUS    . DILATION AND CURETTAGE OF UTERUS N/A 02/16/2020   Procedure: DILATATION AND CURETTAGE;  Surgeon: Lafonda Mosses, MD;  Location: [facility];  Service: Gynecology;  Laterality: N/A;  NOT GENERAL -NO INTUBATION  . INFUSION PUMP IMPLANTATION  ~ 2009   baclofen infusion in lower abd  . INTRAUTERINE DEVICE (IUD) INSERTION N/A 02/16/2020   Procedure: INTRAUTERINE DEVICE (IUD) INSERTION - MIRENA;   Surgeon: Lafonda Mosses, MD;  Location: [facility];  Service: Gynecology;  Laterality: N/A;  . PAIN PUMP REVISION N/A 07/29/2014   Procedure: Baclofen pump replacement;  Surgeon: Erline Levine, MD;  Location: [facility];  Service: Neurosurgery;  Laterality: N/A;  Baclofen pump replacement  . TONSILLECTOMY AND ADENOIDECTOMY    . VARICOSE VEIN SURGERY  ~ 1968  . VENOUS ABLATION  12/16/2010   radiofreq ablation -Dr Elisabeth Cara and [facility]  . WISDOM TOOTH EXTRACTION       OB History    Gravida  2   Para      Term      Preterm      AB      Living        SAB      IAB      Ectopic      Multiple      Live Births              Family History  Problem Relation Age of Onset  . Coronary artery  disease Father        at age 93  . Hyperlipidemia Father   . Thyroid disease Father   . Coronary artery disease Maternal Grandmother   . Depression Maternal Grandmother   . Cancer Paternal Grandmother        breast  . Depression Mother   . Sudden death Mother   . Anxiety disorder Mother   . Alcoholism Son     Social History   Tobacco Use  . Smoking status: Former Smoker    Packs/day: 0.50    Years: 7.00    Pack years: 3.50    Types: Cigarettes    Quit date: 08/21/1976    Years since quitting: 44.2  . Smokeless tobacco: Never Used  Vaping Use  . Vaping Use: Never used  Substance Use Topics  . Alcohol use: Not Currently  . Drug use: No    Home Medications Prior to Admission medications   Medication Sig Start Date End Date Taking? Authorizing Provider  acetaminophen (TYLENOL) 500 MG tablet Take 500-1,000 mg by mouth every 6 (six) hours as needed for fever or headache (or pain).    [provider]  ALPRAZolam (XANAX) 0.5 MG tablet TAKE 1 TABLET BY MOUTH 3  TIMES DAILY AS NEEDED FOR  ANXIETY AND SLEEP 10/18/20   Dohmeier, Asencion Partridge, MD  baclofen (LIORESAL) 20 MG tablet Take 1 tablet (20 mg total) by mouth 2 (two) times daily as needed for muscle spasms. 07/17/20 10/10/21   Penumalli, Earlean Polka, MD  BIOTIN PO Take 1 tablet by mouth daily.    [provider]  buPROPion (WELLBUTRIN XL) 150 MG 24 hr tablet Take 1 tablet (150 mg total) by mouth daily. 10/18/20   Dohmeier, Asencion Partridge, MD  Calcium Carb-Cholecalciferol (CALCIUM + D3 PO) Take 2 tablets by mouth in the morning and at bedtime.    [provider]  cephALEXin (KEFLEX) 250 MG capsule Take 250 mg by mouth daily. 10/06/20   [provider]  cetirizine (ZYRTEC) 10 MG tablet Take 10 mg by mouth daily as needed for allergies.     [provider]  Cholecalciferol (VITAMIN D3) 50 MCG (2000 UT) TABS Take 2,000 Units by mouth in the morning and at bedtime.    [provider]  Coenzyme Q10 (CO Q-10 PO) Take 1 capsule by mouth daily.     [provider]  corticotropin (ACTHAR) 80 UNIT/ML injectable gel 10 days of 80 mg daily- one course - 09/13/20   Dohmeier, Asencion Partridge, MD  diltiazem (CARDIZEM CD) 240 MG 24 hr capsule Take 1 capsule (240 mg total) by mouth daily. 06/18/20 07/18/20  Deberah Pelton, NP  diltiazem (CARDIZEM) 60 MG tablet Take 60 mg by mouth as needed.    [provider]  docusate sodium (COLACE) 100 MG capsule Take 1 capsule (100 mg total) by mouth daily. Patient taking differently: Take 200 mg by mouth daily as needed for mild constipation. 08/12/16   Rai, Ripudeep K, MD  escitalopram (LEXAPRO) 10 MG tablet Take 1 tablet (10 mg total) by mouth daily. 10/18/20   Dohmeier, Asencion Partridge, MD  famotidine (PEPCID) 20 MG tablet Take 20 mg by mouth at bedtime.  06/11/18   [provider]  furosemide (LASIX) 20 MG tablet Take 40 mg by mouth in the morning and at bedtime. Change per 09/20/20 labs    [provider]  gabapentin (NEURONTIN) 300 MG capsule TAKE 1 CAPSULE BY MOUTH UP  TO 4 TIMES DAILY Patient taking differently: Take  300 mg by mouth every 6 (six) hours. 03/02/20   Dohmeier, Asencion Partridge, MD  hydrocortisone 2.5 % cream Apply topically. 07/30/20    [provider]  lactulose, encephalopathy, (GENERLAC) 10 GM/15ML SOLN TAKE 15 ML BY MOUTH TWICE DAILY AS NEEDED Patient taking differently: Take 10 g by mouth See admin instructions. Take 10 grams by mouth once a day and an additional 10 grams once daily, as needed to move the bowels 03/25/20   Dohmeier, Asencion Partridge, MD  levothyroxine (SYNTHROID, LEVOTHROID) 75 MCG tablet Take 75 mcg by mouth daily before breakfast.     [provider]  linaclotide (LINZESS) 145 MCG CAPS capsule Take 1 capsule (145 mcg total) by mouth daily before breakfast. 04/20/20   Thornton Park, MD  melatonin 5 MG TABS Take 5 mg by mouth at bedtime.    [provider]  midodrine (PROAMATINE) 5 MG tablet Take 1 tablet (5 mg total) by mouth 3 (three) times daily with meals. 09/30/19   Hilty, Nadean Corwin, MD  Omega-3 Fatty Acids (FISH OIL PO) Take 1 capsule by mouth daily.    [provider]  omeprazole (PRILOSEC) 20 MG capsule Take 20 mg by mouth daily before breakfast.     [provider]  ondansetron (ZOFRAN) 8 MG tablet Take 8 mg by mouth every morning.  01/12/20   [provider]  oxybutynin (DITROPAN) 5 MG tablet Take 5 mg by mouth 3 (three) times daily as needed for bladder spasms.  02/09/20   [provider]  potassium chloride SA (KLOR-CON) 20 MEQ tablet Take 20 mEq by mouth daily. 09/03/20   [provider]  psyllium (METAMUCIL SMOOTH TEXTURE) 28 % packet Take 1 packet by mouth every evening.     [provider]  simvastatin (ZOCOR) 10 MG tablet Take 10 mg by mouth every evening.  03/01/18   [provider]  SYRINGE/NEEDLE, DISP, 1 ML (BD LUER-LOK SYRINGE) 25G X 5/8" 1 ML MISC Use as directed to inject  [place] 06/15/15   Dohmeier, Asencion Partridge, MD  Tamsulosin HCl (FLOMAX) 0.4 MG CAPS Take 0.4 mg by mouth in the morning.    [provider]  traMADol (ULTRAM) 50 MG tablet Take 1 tablet by mouth every 8 (eight) hours as needed. 09/18/20    [provider]  warfarin (COUMADIN) 5 MG tablet TAKE 1/2 TO 1 TABLET BY  MOUTH DAILY AS DIRECTED BY  COUMADIN CLINIC 09/13/20   Hilty, Nadean Corwin, MD    Allergies    Chlorhexidine, Hydrocodone, Oxycodone, and Sertraline  Review of Systems   Review of Systems  Cardiovascular: Positive for chest pain.  All other systems reviewed and are negative.   Physical Exam Updated Vital Signs BP 113/61   Pulse 92   Temp 98 F (36.7 C)   Resp 13   Ht 5\' 2"  (1.575 m)   Wt 81.6 kg   SpO2 92%   BMI 32.90 kg/m   Physical Exam Vitals and nursing note reviewed.  Constitutional:      Comments: Slightly uncomfortable and chronically ill  HENT:     Head: Normocephalic.  Eyes:     Extraocular Movements: Extraocular movements intact.     Pupils: Pupils are equal, round, and reactive to light.  Cardiovascular:     Rate and Rhythm: Normal rate and regular rhythm.     Heart sounds: Normal heart sounds.  Pulmonary:     Effort: Pulmonary effort is normal.     Breath sounds: Normal breath sounds.  Abdominal:     General: Bowel sounds are normal.     Palpations: Abdomen is soft.  Musculoskeletal:     Cervical back: Normal range of motion and neck supple.     Comments: Trace pitting edema bilaterally  Skin:    General: Skin is warm.     Capillary Refill: Capillary refill takes less than 2 seconds.  Neurological:     General: No focal deficit present.     Mental Status: She is oriented to person, place, and time.  Psychiatric:        Mood and Affect: Mood normal.        Behavior: Behavior normal.     ED Results / Procedures / Treatments   Labs (all labs ordered are listed, but only abnormal results are displayed) Labs Reviewed  COMPREHENSIVE METABOLIC PANEL - Abnormal; Notable for the following components:      Result Value   CO2 37 (*)    Calcium 8.5 (*)    Total Protein 5.7 (*)    Albumin 3.2 (*)    Anion gap 3 (*)    All other components within normal limits  BRAIN  NATRIURETIC PEPTIDE - Abnormal; Notable for the following components:   B Natriuretic Peptide 154.5 (*)    All other components within normal limits  PROTIME-INR - Abnormal; Notable for the following components:   Prothrombin Time 26.8 (*)    INR 2.6 (*)    All other components within normal limits  URINALYSIS, ROUTINE W REFLEX MICROSCOPIC - Abnormal; Notable for the following components:   APPearance HAZY (*)    Leukocytes,Ua LARGE (*)    WBC, UA >50 (*)    Bacteria, UA RARE (*)    All other components within normal limits  I-STAT CHEM 8, ED - Abnormal; Notable for the following components:   Chloride 97 (*)    BUN 26 (*)    Calcium, Ion 1.06 (*)    TCO2 34 (*)    All other components within normal limits  URINE CULTURE  CBC WITH DIFFERENTIAL/PLATELET  TROPONIN I (HIGH SENSITIVITY)  TROPONIN I (HIGH SENSITIVITY)    EKG EKG Interpretation  Date/Time:  Wednesday November 03 2020 15:47:24 EDT Ventricular Rate:  77 PR Interval:    QRS Duration: 113 QT Interval:  426 QTC Calculation: 483 R Axis:   83 Text Interpretation: Atrial fibrillation Borderline intraventricular conduction delay Borderline low voltage, extremity leads No significant change since last tracing Confirmed by Wandra Arthurs [phone]) on 11/03/2020 3:58:12 PM   Radiology CT Head Wo Contrast  Result Date: 11/03/2020 CLINICAL DATA:  Dizziness. EXAM: CT HEAD WITHOUT CONTRAST TECHNIQUE: Contiguous axial images were obtained from the base of the skull through the vertex without intravenous contrast. COMPARISON:  August 06, 2016 FINDINGS: Brain: There is mild cerebral atrophy with widening of the extra-axial spaces and ventricular dilatation. There are areas of decreased attenuation within the white matter tracts of the supratentorial brain, consistent with microvascular disease changes. Vascular: No hyperdense vessel or unexpected calcification. Skull: Normal. Negative for fracture or focal lesion. Sinuses/Orbits: No acute  finding. Other: None. IMPRESSION: No acute intracranial abnormality. Electronically Signed   By: Virgina Norfolk M.D.   On: 11/03/2020 17:32   DG Chest Port 1 View  Result Date: 11/03/2020 CLINICAL DATA:  Chest pain EXAM: PORTABLE CHEST 1 VIEW COMPARISON:  06/09/2020 FINDINGS: Cardiac enlargement with normal vascularity. Lungs well aerated and clear. No infiltrate or effusion. IMPRESSION: No active disease. Electronically Signed   By:  Franchot Gallo M.D.   On: 11/03/2020 16:20   CT Angio Chest/Abd/Pel for Dissection W and/or Wo Contrast  Result Date: 11/03/2020 CLINICAL DATA:  Abdominal pain. EXAM: CT ANGIOGRAPHY CHEST, ABDOMEN AND PELVIS TECHNIQUE: Non-contrast CT of the chest was initially obtained. Multidetector CT imaging through the chest, abdomen and pelvis was performed using the standard protocol during bolus administration of intravenous contrast. Multiplanar reconstructed images and MIPs were obtained and reviewed to evaluate the vascular anatomy. CONTRAST:  1103mL OMNIPAQUE IOHEXOL 350 MG/ML SOLN COMPARISON:  June 04, 2020. FINDINGS: CTA CHEST FINDINGS Cardiovascular: Preferential opacification of the thoracic aorta. No evidence of thoracic aortic aneurysm or dissection. Mild cardiomegaly is noted. No pericardial effusion. Mediastinum/Nodes: No enlarged mediastinal, hilar, or axillary lymph nodes. Thyroid gland, trachea, and esophagus demonstrate no significant findings. Lungs/Pleura: Mosaic pattern is seen throughout both lungs suggesting air trapping secondary to small airways disease or possibly minimal multifocal inflammation. No pneumothorax or pleural effusion is noted. Musculoskeletal: No chest wall abnormality. No acute or significant osseous findings. Review of the MIP images confirms the above findings. CTA ABDOMEN AND PELVIS FINDINGS VASCULAR Aorta: Normal caliber aorta without aneurysm, dissection, vasculitis or significant stenosis. Celiac: Moderate stenosis is noted at the origin  with poststenotic dilatation. No thrombus is noted. SMA: Patent without evidence of aneurysm, dissection, vasculitis or significant stenosis. Renals: Both renal arteries are patent without evidence of aneurysm, dissection, vasculitis, fibromuscular dysplasia or significant stenosis. IMA: Patent without evidence of aneurysm, dissection, vasculitis or significant stenosis. Inflow: Patent without evidence of aneurysm, dissection, vasculitis or significant stenosis. Veins: No obvious venous abnormality within the limitations of this arterial phase study. Review of the MIP images confirms the above findings. NON-VASCULAR Hepatobiliary: No focal liver abnormality is seen. No gallstones, gallbladder wall thickening, or biliary dilatation. Pancreas: Unremarkable. No pancreatic ductal dilatation or surrounding inflammatory changes. Spleen: Normal in size without focal abnormality. Adrenals/Urinary Tract: Adrenal glands appear normal. 1.5 cm exophytic abnormality is seen arising from upper pole of left kidney concerning for possible neoplasm. No hydronephrosis or renal obstruction is noted. No renal or ureteral calculi are noted. Urinary bladder is unremarkable. Stomach/Bowel: Stomach appears normal. There is no evidence of bowel obstruction or inflammation. Stool is seen throughout the colon. The appendix appears normal. Lymphatic: No significant adenopathy is noted. Reproductive: Intrauterine device is noted. No adnexal abnormality is noted. Other: No abdominal wall hernia or abnormality. No abdominopelvic ascites. Musculoskeletal: No acute or significant osseous findings. Review of the MIP images confirms the above findings. IMPRESSION: 1. No evidence of thoracic or abdominal aortic dissection or aneurysm. 2. 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 No thrombus is noted. 3. Mosaic pattern is seen throughout both lungs suggesting air trapping secondary to small airways  disease or possibly minimal multifocal inflammation. 4. 1.5 cm exophytic abnormality is seen arising from upper pole of left kidney concerning for possible neoplasm. MRI is recommended for further evaluation. Electronically Signed   By: Marijo Conception M.D.   On: 11/03/2020 17:40    Procedures Procedures   Medications Ordered in ED Medications  cefTRIAXone (ROCEPHIN) 1 g in sodium chloride 0.9 % 100 mL IVPB (1 g Intravenous New Bag/Given 11/03/20 2048)  iohexol (OMNIPAQUE) 350 MG/ML injection 100 mL (100 mLs Intravenous Contrast Given 11/03/20 1716)  acetaminophen (TYLENOL) tablet 650 mg (650 mg Oral Given 11/03/20 2102)    ED Course  I have reviewed the triage vital signs and the nursing notes.  Pertinent labs &  imaging results that were available during my care of the patient were reviewed by me and considered in my medical decision making (see chart for details).    MDM Rules/Calculators/A&P                         JALAN BODI is a 75 y.o. female here presenting with confusion and chest pain.  Patient was confused yesterday.  Patient had chest pain radiated to the back today.  She is on Coumadin.  Concern for possible dissection.  Also concern for head bleed.  Patient not hypoxic currently.  Plan to get CT dissection study, CBC and CMP and troponin x2, CT head.  9:07 PM Patient's labs were unremarkable.  Troponin negative x2.  BNP is 150.  Her INR is 2.6.  Dissection study is negative.  Patient does have some air trapping and minimal multifocal inflammation.  Offered steroids but patient states that she gets recurrent UTIs so she does not want any steroids. Patient also has a incidental right kidney mass that can be followed up.  Patient has UTI.  Patient was given Rocephin in the ED.  She is on prophylactic dose of Keflex 250 mg daily.  She has tried multiple antibiotics in the past and she would like to just increase Keflex 500 mg 3 times daily for a week.  She has seen urologist in  the past and she states that she plans to follow-up with her urogynecologist at [facility].  Stable for discharge   Final Clinical Impression(s) / ED Diagnoses Final diagnoses:  None    Rx / DC Orders ED Discharge Orders    None       Drenda Freeze, MD 11/03/20 2109

## 2020-11-03 NOTE — ED Notes (Signed)
[facility] called  

## 2020-11-03 NOTE — ED Notes (Signed)
Pt provided meal tray per request, daughter at bedside reports mother is bedridden and will need [facility] transport services to transfer home.

## 2020-11-05 LAB — URINE CULTURE: Culture: NO GROWTH

## 2020-11-07 DIAGNOSIS — G35 Multiple sclerosis: Secondary | ICD-10-CM | POA: Diagnosis not present

## 2020-11-07 DIAGNOSIS — J45909 Unspecified asthma, uncomplicated: Secondary | ICD-10-CM | POA: Diagnosis not present

## 2020-11-07 DIAGNOSIS — J9621 Acute and chronic respiratory failure with hypoxia: Secondary | ICD-10-CM | POA: Diagnosis not present

## 2020-11-07 DIAGNOSIS — I509 Heart failure, unspecified: Secondary | ICD-10-CM | POA: Diagnosis not present

## 2020-11-07 DIAGNOSIS — I110 Hypertensive heart disease with heart failure: Secondary | ICD-10-CM | POA: Diagnosis not present

## 2020-11-07 DIAGNOSIS — Z87440 Personal history of urinary (tract) infections: Secondary | ICD-10-CM | POA: Diagnosis not present

## 2020-11-08 ENCOUNTER — Ambulatory Visit (INDEPENDENT_AMBULATORY_CARE_PROVIDER_SITE_OTHER): Payer: Medicare Other | Admitting: Family Medicine

## 2020-11-08 ENCOUNTER — Encounter: Payer: Self-pay | Admitting: Family Medicine

## 2020-11-08 ENCOUNTER — Telehealth: Payer: Self-pay | Admitting: Internal Medicine

## 2020-11-08 ENCOUNTER — Telehealth: Payer: Self-pay

## 2020-11-08 DIAGNOSIS — M25511 Pain in right shoulder: Secondary | ICD-10-CM

## 2020-11-08 DIAGNOSIS — M25512 Pain in left shoulder: Secondary | ICD-10-CM | POA: Diagnosis not present

## 2020-11-08 NOTE — Progress Notes (Signed)
Office Visit Note   Patient: Toni [name]           Date of Birth: 1946/02/26           MRN: [id number] Visit Date: 11/08/2020 Requested by: Prince Solian, MD [address] [place],  [place] [address] PCP: Prince Solian, MD  Subjective: Chief Complaint  Patient presents with  . Left Shoulder - Pain    Pain in the shoulder and down into the upper arm since last Thursday. Hurts with certain movements - cannot raise the arm very far, due to pain. Tramadol eases the pain some.     HPI: She is here with left greater than right shoulder pain.  Symptoms started about a week ago for the left shoulder, no injury.  She has been dealing with low-grade right shoulder pain for a while.  She works with a home health physical therapist and does exercises on a regular basis.  She cannot recall a specific injury to account for her current pains.  The left shoulder now pops and it hurts when it pops.  She has tried ibuprofen but she cannot take it regularly due to Coumadin.  She is right-hand dominant.  She has a history of bilateral carpal tunnel and cubital tunnel syndrome.              ROS:   All other systems were reviewed and are negative.  Objective: Vital Signs: There were no vitals taken for this visit.  Physical Exam:  General:  Alert and oriented, in no acute distress. Pulm:  Breathing unlabored. Psy:  Normal mood, congruent affect.  Shoulders: She has pain with overhead and behind back reach bilaterally.  Both shoulders are tender in the posterior subacromial space.  She has pain with empty can test bilaterally but rotator cuff strength seems to be intact.  Pain with passive impingement on the left, especially with abduction and internal rotation.  Imaging: No results found.  Assessment & Plan: 1.  Left greater than right shoulder pain, suspect impingement.  Cannot rule out partial rotator cuff tear. -Discussed options and elected to inject both shoulders into the posterior  subacromial space.  X-rays and possibly MRI imaging if she fails to improve.     Procedures: Bilateral shoulder injections: After sterile prep with Betadine, injected 3 cc 0.25% bupivacaine and 6 mg betamethasone from posterior approach into the subacromial space of each shoulder.       PMFS History: Patient Active Problem List   Diagnosis Date Noted  . Bilateral carpal tunnel syndrome 10/04/2020  . Multiple sclerosis, 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[place]) 09/13/2020  . Chronic intermittent hypoxia with obstructive sleep apnea 09/13/2020  . COVID-19 long hauler manifesting chronic dyspnea 09/13/2020  . Cushing's syndrome, ACTH dependent ([place]) 09/13/2020  . Chronic diastolic CHF (congestive heart failure) ([place]) 09/13/2020  . Acute respiratory failure with hypoxia ([place]) 09/13/2020  . Class 2 severe obesity with serious comorbidity and body mass index (BMI) of 35.0 to 35.9 in adult [facility]) 09/13/2020  . Sepsis ([place]) 06/04/2020  . Acute metabolic encephalopathy 10/93/2355  . Atrial fibrillation with RVR ([place]) 04/07/2020  . Recurrent UTI 04/07/2020  . Uncomplicated degenerative myopia of both eyes 03/19/2020  . 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 Oxygen dependent 02/26/2020  . Neurogenic hypoventilation 02/26/2020  . Complex atypical endometrial hyperplasia   . Acute on chronic respiratory failure with hypoxia ([place]) 10/10/2019  . Multiple sclerosis ([place]) 10/10/2019  . COVID-19 virus infection 08/23/2019  . Bilateral leg weakness 08/04/2019  .  Weakness 08/04/2019  . Unilateral primary osteoarthritis, left knee 01/09/2019  . Type 2 diabetes mellitus without complication, without long-term current use of insulin ([place]) 08/27/2018  . Obstructive sleep apnea treated with bilevel positive airway pressure (BiPAP) 08/15/2018  . 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 ([place]) 11/21/2017  . Permanent atrial fibrillation ([place]) 11/21/2017  . Tightness of leg fascia 11/21/2017  . Muscle  spasm of both lower legs 11/21/2017  . Carpal tunnel syndrome, left upper limb 09/13/2017  . Carpal tunnel syndrome, right upper limb 09/13/2017  . Other insomnia 06/11/2017  . Depression 06/11/2017  . Vitamin D deficiency 03/22/2017  . Right hand weakness 08/06/2016  . Nonischemic cardiomyopathy ([place]) 05/09/2016  . Localized edema 05/09/2016  . Long term current use of anticoagulant therapy 02/03/2016  . 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 Obesity hypoventilation syndrome ([place]) 03/30/2015  . Abnormality of gait 03/01/2015  . Chronic constipation with overflow incontinence 01/25/2015  . ACTH dependent Cushing's syndrome ([place]) 01/25/2015  . 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) ([place]) 01/25/2015  . 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 Varicose veins of both lower extremities with complications 25/12/3974  . Long term (current) use of anticoagulants 11/05/2012  . Pseudophakia of both eyes 02/07/2012  . 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 Essential hypertension   . Atrial fibrillation ([place])   . Thyroid disease   . HLD (hyperlipidemia)    Past Medical History:  Diagnosis Date  . Abnormality of gait 03/01/2015  . Anxiety   . Arthritis    "knees" (01/19/2016)  . Asthma    as a child   . Back pain   . Bilateral carpal tunnel syndrome 10/04/2020  . Cancer ([place])    / RENAL CELL CARCINOMA - GETS CT SCAN EVERY 6 MONTHS TO WATCH   . CHF (congestive heart failure) ([place])   . Chronic pain    "nerve pain from the MS"  . Complex atypical endometrial hyperplasia   . Constipation   . Depression   . Diabetes mellitus without complication ([place])   . DVT (deep venous thrombosis) ([place]) 1983   "RLE; may have just been phlebitis; it was before the age of dopplers"  . Dysrhythmia    afib   . Edema    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' ablation of R GSV 2012   . GERD (gastroesophageal reflux disease)   . HLD (hyperlipidemia)   . Hypertension   . Hypotension   . Hypothyroidism   . Joint pain   . Multiple sclerosis ([place])    has had this may 1989-[place] reg doc  . OSA on CPAP    bipap machine - SLEEP APNEA WORSE SINCE COVID IN 1/21 PER PATIENT SETTING HAS INCREASED FROM 9 TO 30   . Osteoarthritis   . PAF (paroxysmal atrial fibrillation) ([place])    on coumadin; documented on monitor 06/2010  . Pneumonia 11/2011  . Sleep apnea     Family History  Problem Relation Age of Onset  . Coronary artery disease Father        at age 38  . Hyperlipidemia Father   . Thyroid disease Father   . Coronary artery disease Maternal Grandmother   . Depression Maternal Grandmother   . Cancer Paternal Grandmother        breast  . Depression Mother   . Sudden death Mother   . Anxiety disorder  Mother   . Alcoholism Son     Past Surgical History:  Procedure Laterality Date  . ANTERIOR CERVICAL DECOMP/DISCECTOMY FUSION  01/2004  . CARDIAC CATHETERIZATION  06/22/2010   normal coronary arteries, PAF  . CATARACT EXTRACTION, BILATERAL Bilateral   . CHILD BIRTHS     X2  . DILATATION & CURETTAGE/HYSTEROSCOPY WITH MYOSURE N/A 01/15/2020   Procedure: DILATATION & CURETTAGE/HYSTEROSCOPY WITH MYOSURE;  Surgeon: Arvella Nigh, MD;  Location: [facility];  Service: Gynecology;  Laterality: N/A;  pt in wheelchair-has MS  . DILATION AND CURETTAGE OF UTERUS    . DILATION AND CURETTAGE OF UTERUS N/A 02/16/2020   Procedure: DILATATION AND CURETTAGE;  Surgeon: Lafonda Mosses, MD;  Location: [facility];  Service: Gynecology;  Laterality: N/A;  NOT GENERAL -NO INTUBATION  . INFUSION PUMP IMPLANTATION  ~ 2009   baclofen infusion in lower abd  . INTRAUTERINE DEVICE (IUD) INSERTION N/A 02/16/2020   Procedure: INTRAUTERINE DEVICE (IUD) INSERTION - MIRENA;  Surgeon: Lafonda Mosses, MD;  Location: [facility];  Service: Gynecology;  Laterality: N/A;  . PAIN PUMP REVISION N/A 07/29/2014    Procedure: Baclofen pump replacement;  Surgeon: Erline Levine, MD;  Location: [facility];  Service: Neurosurgery;  Laterality: N/A;  Baclofen pump replacement  . TONSILLECTOMY AND ADENOIDECTOMY    . VARICOSE VEIN SURGERY  ~ 1968  . VENOUS ABLATION  12/16/2010   radiofreq ablation -Dr Elisabeth Cara and [facility]  . WISDOM TOOTH EXTRACTION     Social History   Occupational History  . Occupation: RETIRED  Tobacco Use  . Smoking status: Former Smoker    Packs/day: 0.50    Years: 7.00    Pack years: 3.50    Types: Cigarettes    Quit date: 08/21/1976    Years since quitting: 44.2  . Smokeless tobacco: Never Used  Vaping Use  . Vaping Use: Never used  Substance and Sexual Activity  . Alcohol use: Not Currently  . Drug use: No  . Sexual activity: Not Currently

## 2020-11-08 NOTE — Telephone Encounter (Signed)
Health nurse called and stated that the pt had previously had an er visit and been placed on keflex for the past 5 days and that they will draw an inr tomorrow.

## 2020-11-08 NOTE — Telephone Encounter (Signed)
Called patient's daughter- okay per DPR.  Patient daughter states that they were just questioning other options for medications then the warfarin so she can get them in the packs. I did advise that Dr.Hilty was in clinic today and would respond as soon as he could. She just wanted to know if this was even an option, appointment may be neccessary to discuss risk. Patient daughter verbalized understanding.

## 2020-11-08 NOTE — Telephone Encounter (Signed)
I would suggest she contact her pharmacy to see if they are able to pre package either Eliquis or Xarelto .

## 2020-11-08 NOTE — Telephone Encounter (Signed)
Appreciate pharmacy recommendations - I suspect they don't "pill pack" warfarin due to the fact the dose may change more frequently. I'm not aware of a reason she could not switch to Eliquis or Xarelto - she has been comfortable on warfarin in the past for many years.  Dr Lemmie Evens

## 2020-11-08 NOTE — Telephone Encounter (Signed)
Also routed to PharmD regarding blister packaging & anticoagulants

## 2020-11-08 NOTE — Telephone Encounter (Signed)
Pt c/o medication issue:  1. Name of Medication: warfarin (COUMADIN) 5 MG tablet  2. How are you currently taking this medication (dosage and times per day)? As prescribed  3. Are you having a reaction (difficulty breathing--STAT)? No   4. What is your medication issue?   Patient's daughter states it is easier for the patient to take the medication with blister packs, but they are unable to get Warfarin in that form. She would like to know if the patient is eligible for an alternative blood thinner so that they can get it in the blister packs.

## 2020-11-08 NOTE — Telephone Encounter (Signed)
I would see why they will not pre-pack warfarin. It might just be because the directions on the rx are non-specific. There is no reason warfarin cannot go in a blister pack. If patient knows who pre-packs,i would be happy to call and find out. Ok to changed to Eliquis or Xarelto, but the question would be if she can afford.

## 2020-11-09 NOTE — Telephone Encounter (Signed)
Spoke with daughter - she is working ahead to do some planning because her parents are getting older and more forgetful.  She spoke with an elder care counsellor who suggested they look into doing blister packs for her medications.  She was told that they could not put warfarin in the blister pack.    Discussed options of Xarelto and Eliquis.  Patient spends most of year in catastrophic coverage, per Medicare website would be approx $80 for 3 month supply (5% or $9.20/month, whichever is more).  Daughter would prefer to leave her on warfarin, as patient's medical costs are already quite high.  Agreed that should there be a safety concern for patient using warfarin, we can discuss DOAC again in the future.    Daughter appreciative of conversation.

## 2020-11-09 NOTE — Telephone Encounter (Signed)
This encounter was created in error - please disregard.

## 2020-11-09 NOTE — Telephone Encounter (Signed)
Routed to [facility] team regarding pill pack anticoagulant and possible change in med

## 2020-11-10 DIAGNOSIS — Z8701 Personal history of pneumonia (recurrent): Secondary | ICD-10-CM | POA: Diagnosis not present

## 2020-11-10 DIAGNOSIS — I509 Heart failure, unspecified: Secondary | ICD-10-CM | POA: Diagnosis not present

## 2020-11-10 DIAGNOSIS — I4821 Permanent atrial fibrillation: Secondary | ICD-10-CM | POA: Diagnosis not present

## 2020-11-10 DIAGNOSIS — I951 Orthostatic hypotension: Secondary | ICD-10-CM | POA: Diagnosis not present

## 2020-11-10 DIAGNOSIS — K219 Gastro-esophageal reflux disease without esophagitis: Secondary | ICD-10-CM | POA: Diagnosis not present

## 2020-11-10 DIAGNOSIS — N39490 Overflow incontinence: Secondary | ICD-10-CM | POA: Diagnosis not present

## 2020-11-10 DIAGNOSIS — E785 Hyperlipidemia, unspecified: Secondary | ICD-10-CM | POA: Diagnosis not present

## 2020-11-10 DIAGNOSIS — K5904 Chronic idiopathic constipation: Secondary | ICD-10-CM | POA: Diagnosis not present

## 2020-11-10 DIAGNOSIS — I110 Hypertensive heart disease with heart failure: Secondary | ICD-10-CM | POA: Diagnosis not present

## 2020-11-10 DIAGNOSIS — G35 Multiple sclerosis: Secondary | ICD-10-CM | POA: Diagnosis not present

## 2020-11-10 DIAGNOSIS — Z87440 Personal history of urinary (tract) infections: Secondary | ICD-10-CM | POA: Diagnosis not present

## 2020-11-10 DIAGNOSIS — Z6832 Body mass index (BMI) 32.0-32.9, adult: Secondary | ICD-10-CM | POA: Diagnosis not present

## 2020-11-10 DIAGNOSIS — F419 Anxiety disorder, unspecified: Secondary | ICD-10-CM | POA: Diagnosis not present

## 2020-11-10 DIAGNOSIS — J45909 Unspecified asthma, uncomplicated: Secondary | ICD-10-CM | POA: Diagnosis not present

## 2020-11-10 DIAGNOSIS — I8390 Asymptomatic varicose veins of unspecified lower extremity: Secondary | ICD-10-CM | POA: Diagnosis not present

## 2020-11-10 DIAGNOSIS — E039 Hypothyroidism, unspecified: Secondary | ICD-10-CM | POA: Diagnosis not present

## 2020-11-10 DIAGNOSIS — E1151 Type 2 diabetes mellitus with diabetic peripheral angiopathy without gangrene: Secondary | ICD-10-CM | POA: Diagnosis not present

## 2020-11-10 DIAGNOSIS — C649 Malignant neoplasm of unspecified kidney, except renal pelvis: Secondary | ICD-10-CM | POA: Diagnosis not present

## 2020-11-10 DIAGNOSIS — I480 Paroxysmal atrial fibrillation: Secondary | ICD-10-CM | POA: Diagnosis not present

## 2020-11-10 DIAGNOSIS — I872 Venous insufficiency (chronic) (peripheral): Secondary | ICD-10-CM | POA: Diagnosis not present

## 2020-11-10 DIAGNOSIS — F32A Depression, unspecified: Secondary | ICD-10-CM | POA: Diagnosis not present

## 2020-11-10 DIAGNOSIS — E669 Obesity, unspecified: Secondary | ICD-10-CM | POA: Diagnosis not present

## 2020-11-10 DIAGNOSIS — Z8616 Personal history of COVID-19: Secondary | ICD-10-CM | POA: Diagnosis not present

## 2020-11-10 DIAGNOSIS — J9621 Acute and chronic respiratory failure with hypoxia: Secondary | ICD-10-CM | POA: Diagnosis not present

## 2020-11-10 DIAGNOSIS — M170 Bilateral primary osteoarthritis of knee: Secondary | ICD-10-CM | POA: Diagnosis not present

## 2020-11-11 ENCOUNTER — Ambulatory Visit (INDEPENDENT_AMBULATORY_CARE_PROVIDER_SITE_OTHER): Payer: Medicare Other | Admitting: Cardiovascular Disease

## 2020-11-11 ENCOUNTER — Ambulatory Visit: Payer: Medicare Other

## 2020-11-11 ENCOUNTER — Telehealth: Payer: Self-pay | Admitting: Neurology

## 2020-11-11 DIAGNOSIS — J45909 Unspecified asthma, uncomplicated: Secondary | ICD-10-CM | POA: Diagnosis not present

## 2020-11-11 DIAGNOSIS — J9621 Acute and chronic respiratory failure with hypoxia: Secondary | ICD-10-CM | POA: Diagnosis not present

## 2020-11-11 DIAGNOSIS — Z7901 Long term (current) use of anticoagulants: Secondary | ICD-10-CM

## 2020-11-11 DIAGNOSIS — I509 Heart failure, unspecified: Secondary | ICD-10-CM | POA: Diagnosis not present

## 2020-11-11 DIAGNOSIS — G35 Multiple sclerosis: Secondary | ICD-10-CM | POA: Diagnosis not present

## 2020-11-11 DIAGNOSIS — I110 Hypertensive heart disease with heart failure: Secondary | ICD-10-CM | POA: Diagnosis not present

## 2020-11-11 DIAGNOSIS — Z87440 Personal history of urinary (tract) infections: Secondary | ICD-10-CM | POA: Diagnosis not present

## 2020-11-11 LAB — POCT INR: INR: 2.2 (ref 2.0–3.0)

## 2020-11-11 NOTE — Telephone Encounter (Signed)
Pt called, this is the contact number for Toni [name] that handles the oxygen; [phone]. Would like a call from the nurse.

## 2020-11-11 NOTE — Telephone Encounter (Signed)
Spoke with the manager at [facility] who states that they have everything they need. Their billing dept is handling this incorrectly and she will reach out to them to get to the bottom of this. She advised that she would reach out to the pt and inform of this as well.   **If pt calls back please make sure she has the local DME number [facility] Location # [phone] James/Christina are their manager's, if she continues to have problems she can contact and ask for him personally.

## 2020-11-11 NOTE — Telephone Encounter (Signed)
Pt states [facility] is telling her there is a discrepancy on her sleep study date.  Pt states [facility] is working to help with needed oxygen but unable to make contact with office. Pt asking to be called

## 2020-11-11 NOTE — Telephone Encounter (Signed)
This patient continues to receive calls from the nationwide office indicating that there is a problem.  But there is not a problem I have confirmed with the manager at [facility] and they have everything that they need to make sure her oxygen and ASV needs are met.  Patient should contact the local office for concerns.  At this point I have messaged the manager at [facility] to further look into this and will have them contact him to put the patient at ease.

## 2020-11-12 DIAGNOSIS — Z87440 Personal history of urinary (tract) infections: Secondary | ICD-10-CM | POA: Diagnosis not present

## 2020-11-12 DIAGNOSIS — I509 Heart failure, unspecified: Secondary | ICD-10-CM | POA: Diagnosis not present

## 2020-11-12 DIAGNOSIS — J45909 Unspecified asthma, uncomplicated: Secondary | ICD-10-CM | POA: Diagnosis not present

## 2020-11-12 DIAGNOSIS — I110 Hypertensive heart disease with heart failure: Secondary | ICD-10-CM | POA: Diagnosis not present

## 2020-11-12 DIAGNOSIS — G35 Multiple sclerosis: Secondary | ICD-10-CM | POA: Diagnosis not present

## 2020-11-12 DIAGNOSIS — J9621 Acute and chronic respiratory failure with hypoxia: Secondary | ICD-10-CM | POA: Diagnosis not present

## 2020-11-14 ENCOUNTER — Other Ambulatory Visit: Payer: Self-pay | Admitting: Neurology

## 2020-11-14 ENCOUNTER — Other Ambulatory Visit: Payer: Self-pay | Admitting: Internal Medicine

## 2020-11-15 DIAGNOSIS — J9621 Acute and chronic respiratory failure with hypoxia: Secondary | ICD-10-CM | POA: Diagnosis not present

## 2020-11-15 DIAGNOSIS — I509 Heart failure, unspecified: Secondary | ICD-10-CM | POA: Diagnosis not present

## 2020-11-15 DIAGNOSIS — Z87440 Personal history of urinary (tract) infections: Secondary | ICD-10-CM | POA: Diagnosis not present

## 2020-11-15 DIAGNOSIS — J45909 Unspecified asthma, uncomplicated: Secondary | ICD-10-CM | POA: Diagnosis not present

## 2020-11-15 DIAGNOSIS — G35 Multiple sclerosis: Secondary | ICD-10-CM | POA: Diagnosis not present

## 2020-11-15 DIAGNOSIS — I110 Hypertensive heart disease with heart failure: Secondary | ICD-10-CM | POA: Diagnosis not present

## 2020-11-17 ENCOUNTER — Ambulatory Visit (INDEPENDENT_AMBULATORY_CARE_PROVIDER_SITE_OTHER): Payer: Medicare Other | Admitting: Pharmacist Clinician (PhC)/ Clinical Pharmacy Specialist

## 2020-11-17 DIAGNOSIS — G35 Multiple sclerosis: Secondary | ICD-10-CM | POA: Diagnosis not present

## 2020-11-17 DIAGNOSIS — Z7901 Long term (current) use of anticoagulants: Secondary | ICD-10-CM

## 2020-11-17 DIAGNOSIS — I4891 Unspecified atrial fibrillation: Secondary | ICD-10-CM

## 2020-11-17 DIAGNOSIS — Z87440 Personal history of urinary (tract) infections: Secondary | ICD-10-CM | POA: Diagnosis not present

## 2020-11-17 DIAGNOSIS — J45909 Unspecified asthma, uncomplicated: Secondary | ICD-10-CM | POA: Diagnosis not present

## 2020-11-17 DIAGNOSIS — I110 Hypertensive heart disease with heart failure: Secondary | ICD-10-CM | POA: Diagnosis not present

## 2020-11-17 DIAGNOSIS — I509 Heart failure, unspecified: Secondary | ICD-10-CM | POA: Diagnosis not present

## 2020-11-17 DIAGNOSIS — J9621 Acute and chronic respiratory failure with hypoxia: Secondary | ICD-10-CM | POA: Diagnosis not present

## 2020-11-17 LAB — POCT INR: INR: 2.2 (ref 2.0–3.0)

## 2020-11-19 DIAGNOSIS — J45909 Unspecified asthma, uncomplicated: Secondary | ICD-10-CM | POA: Diagnosis not present

## 2020-11-19 DIAGNOSIS — G35 Multiple sclerosis: Secondary | ICD-10-CM | POA: Diagnosis not present

## 2020-11-19 DIAGNOSIS — I110 Hypertensive heart disease with heart failure: Secondary | ICD-10-CM | POA: Diagnosis not present

## 2020-11-19 DIAGNOSIS — Z87440 Personal history of urinary (tract) infections: Secondary | ICD-10-CM | POA: Diagnosis not present

## 2020-11-19 DIAGNOSIS — I509 Heart failure, unspecified: Secondary | ICD-10-CM | POA: Diagnosis not present

## 2020-11-19 DIAGNOSIS — J9621 Acute and chronic respiratory failure with hypoxia: Secondary | ICD-10-CM | POA: Diagnosis not present

## 2020-11-20 DIAGNOSIS — J45909 Unspecified asthma, uncomplicated: Secondary | ICD-10-CM | POA: Diagnosis not present

## 2020-11-20 DIAGNOSIS — Z87440 Personal history of urinary (tract) infections: Secondary | ICD-10-CM | POA: Diagnosis not present

## 2020-11-20 DIAGNOSIS — J9621 Acute and chronic respiratory failure with hypoxia: Secondary | ICD-10-CM | POA: Diagnosis not present

## 2020-11-20 DIAGNOSIS — I509 Heart failure, unspecified: Secondary | ICD-10-CM | POA: Diagnosis not present

## 2020-11-20 DIAGNOSIS — I110 Hypertensive heart disease with heart failure: Secondary | ICD-10-CM | POA: Diagnosis not present

## 2020-11-20 DIAGNOSIS — G35 Multiple sclerosis: Secondary | ICD-10-CM | POA: Diagnosis not present

## 2020-11-24 DIAGNOSIS — I509 Heart failure, unspecified: Secondary | ICD-10-CM | POA: Diagnosis not present

## 2020-11-24 DIAGNOSIS — Z87440 Personal history of urinary (tract) infections: Secondary | ICD-10-CM | POA: Diagnosis not present

## 2020-11-24 DIAGNOSIS — G35 Multiple sclerosis: Secondary | ICD-10-CM | POA: Diagnosis not present

## 2020-11-24 DIAGNOSIS — J9621 Acute and chronic respiratory failure with hypoxia: Secondary | ICD-10-CM | POA: Diagnosis not present

## 2020-11-24 DIAGNOSIS — J45909 Unspecified asthma, uncomplicated: Secondary | ICD-10-CM | POA: Diagnosis not present

## 2020-11-24 DIAGNOSIS — I110 Hypertensive heart disease with heart failure: Secondary | ICD-10-CM | POA: Diagnosis not present

## 2020-11-25 DIAGNOSIS — G35 Multiple sclerosis: Secondary | ICD-10-CM | POA: Diagnosis not present

## 2020-11-25 DIAGNOSIS — Z87440 Personal history of urinary (tract) infections: Secondary | ICD-10-CM | POA: Diagnosis not present

## 2020-11-25 DIAGNOSIS — J9621 Acute and chronic respiratory failure with hypoxia: Secondary | ICD-10-CM | POA: Diagnosis not present

## 2020-11-25 DIAGNOSIS — J45909 Unspecified asthma, uncomplicated: Secondary | ICD-10-CM | POA: Diagnosis not present

## 2020-11-25 DIAGNOSIS — I110 Hypertensive heart disease with heart failure: Secondary | ICD-10-CM | POA: Diagnosis not present

## 2020-11-25 DIAGNOSIS — I509 Heart failure, unspecified: Secondary | ICD-10-CM | POA: Diagnosis not present

## 2020-11-30 ENCOUNTER — Encounter: Payer: Self-pay | Admitting: Neurology

## 2020-11-30 ENCOUNTER — Ambulatory Visit (INDEPENDENT_AMBULATORY_CARE_PROVIDER_SITE_OTHER): Payer: Medicare Other | Admitting: Neurology

## 2020-11-30 VITALS — BP 115/62 | HR 69 | Ht 62.0 in | Wt 182.0 lb

## 2020-11-30 DIAGNOSIS — G35 Multiple sclerosis: Secondary | ICD-10-CM

## 2020-11-30 MED ORDER — BACLOFEN 40 MG/20ML IT SOLN
80.0000 mg | Freq: Once | INTRATHECAL | Status: AC
  Administered 2020-11-30: 80 mg via INTRATHECAL

## 2020-11-30 NOTE — Progress Notes (Signed)
Please refer to the baclofen procedure note.

## 2020-11-30 NOTE — Procedures (Signed)
     History:  Toni [name] is a 75 year old patient with history of multiple sclerosis with lower extremity spasticity.  Patient comes in for a baclofen pump refill.  She has noted some slight worsening of her spasticity, she wishes to have some increase in her pump dosing.  Baclofen pump refill note  The baclofen pump site was cleaned with Betadine solution. A 21-gauge needle was inserted into the pump port site. Approximately 8 cc of residual baclofen was removed, the pump indicates a 3.2 cc residual. 40 cc of replacement baclofen was placed into the pump at 2000 mcg/cc concentration.  The pump was reprogrammed for the following settings: The pump settings are a simple continuous, increased from 899.7 mcg/day to a dose of 929.3 mcg/day.  The alarm volume is set at 1.5 cc. The next alarm date is February 20, 2021.  The patient tolerated the procedure well. There were no complications of the above procedure.  The [employer] number is [id number] The baclofen expiration date is May 2024. The baclofen lot number is [id number].

## 2020-12-01 ENCOUNTER — Ambulatory Visit (INDEPENDENT_AMBULATORY_CARE_PROVIDER_SITE_OTHER): Payer: Medicare Other | Admitting: Cardiology

## 2020-12-01 DIAGNOSIS — I110 Hypertensive heart disease with heart failure: Secondary | ICD-10-CM | POA: Diagnosis not present

## 2020-12-01 DIAGNOSIS — Z7901 Long term (current) use of anticoagulants: Secondary | ICD-10-CM | POA: Diagnosis not present

## 2020-12-01 DIAGNOSIS — J45909 Unspecified asthma, uncomplicated: Secondary | ICD-10-CM | POA: Diagnosis not present

## 2020-12-01 DIAGNOSIS — I4891 Unspecified atrial fibrillation: Secondary | ICD-10-CM

## 2020-12-01 DIAGNOSIS — J9621 Acute and chronic respiratory failure with hypoxia: Secondary | ICD-10-CM | POA: Diagnosis not present

## 2020-12-01 DIAGNOSIS — G35 Multiple sclerosis: Secondary | ICD-10-CM | POA: Diagnosis not present

## 2020-12-01 DIAGNOSIS — I509 Heart failure, unspecified: Secondary | ICD-10-CM | POA: Diagnosis not present

## 2020-12-01 DIAGNOSIS — Z87440 Personal history of urinary (tract) infections: Secondary | ICD-10-CM | POA: Diagnosis not present

## 2020-12-01 DIAGNOSIS — N390 Urinary tract infection, site not specified: Secondary | ICD-10-CM | POA: Diagnosis not present

## 2020-12-01 LAB — POCT INR: INR: 2.5 (ref 2.0–3.0)

## 2020-12-02 DIAGNOSIS — J45909 Unspecified asthma, uncomplicated: Secondary | ICD-10-CM | POA: Diagnosis not present

## 2020-12-02 DIAGNOSIS — I509 Heart failure, unspecified: Secondary | ICD-10-CM | POA: Diagnosis not present

## 2020-12-02 DIAGNOSIS — J9621 Acute and chronic respiratory failure with hypoxia: Secondary | ICD-10-CM | POA: Diagnosis not present

## 2020-12-02 DIAGNOSIS — I110 Hypertensive heart disease with heart failure: Secondary | ICD-10-CM | POA: Diagnosis not present

## 2020-12-02 DIAGNOSIS — Z87440 Personal history of urinary (tract) infections: Secondary | ICD-10-CM | POA: Diagnosis not present

## 2020-12-02 DIAGNOSIS — G35 Multiple sclerosis: Secondary | ICD-10-CM | POA: Diagnosis not present

## 2020-12-03 ENCOUNTER — Telehealth: Payer: Self-pay | Admitting: Internal Medicine

## 2020-12-03 DIAGNOSIS — Z79899 Other long term (current) drug therapy: Secondary | ICD-10-CM

## 2020-12-03 DIAGNOSIS — I5032 Chronic diastolic (congestive) heart failure: Secondary | ICD-10-CM

## 2020-12-03 NOTE — Telephone Encounter (Signed)
Ok to increase lasix to 40 mg TID (q8 hrs)- would also increase potassium to 40 MEQ daily. Check BMET next week.   Dr Lemmie Evens

## 2020-12-03 NOTE — Telephone Encounter (Signed)
Pt c/o swelling: STAT is pt has developed SOB within 24 hours  1) How much weight have you gained and in what time span? Pt is unable to stand up/Pt is bedritten  2) If swelling, where is the swelling located? Stomach and upper legs  3) Are you currently taking a fluid pill? Yes  4) Are you currently SOB? No  5) Do you have a log of your daily weights (if so, list)? No  6) Have you gained 3 pounds in a day or 5 pounds in a week? Pt is not sure  7) Have you traveled recently? No  Pt states she takes a fluid and potassium pill daily and she would like to know if she can increase the dosage. Pt has [facility] assistance that can take verbal orders over the phone if pt can increase her potassium and fluid pill dosage. Pt's home healthcare nurse name is Joellen Jersey

## 2020-12-03 NOTE — Telephone Encounter (Signed)
Returned call to patient of Dr. Debara Pickett. She is c/o upper leg/abdominal swelling that has not decreased with her usual thigh-high compression stockings and taking lasix 40mg  BID + 20Eq of K+ daily  She has a foley catheter right now - reports good UOP (maybe not as much in the last day or so) She states "I look like I'm pregnant" She reports in previous, abdominal edema would resolve overnight She is unable to stand to weigh She is able to lie down (in her hospital bed) to sleep - uses BiPAP + 5L O2 QHS  She would like to know if OK to increase lasix - BMET from March 2022 in epic (renal function WNL)  [facility] is coming Tuesday - could do labs then if needed  Will send to MD to review

## 2020-12-06 ENCOUNTER — Telehealth: Payer: Self-pay | Admitting: Internal Medicine

## 2020-12-06 MED ORDER — FUROSEMIDE 20 MG PO TABS: 40.0000 mg | ORAL_TABLET | Freq: Three times a day (TID) | ORAL | 3 refills | Status: DC

## 2020-12-06 MED ORDER — POTASSIUM CHLORIDE CRYS ER 20 MEQ PO TBCR: 40.0000 meq | EXTENDED_RELEASE_TABLET | Freq: Every day | ORAL | 3 refills | Status: DC

## 2020-12-06 MED ORDER — FUROSEMIDE 20 MG PO TABS: 40.0000 mg | ORAL_TABLET | Freq: Three times a day (TID) | ORAL | 0 refills | Status: DC

## 2020-12-06 NOTE — Telephone Encounter (Signed)
*  STAT* If patient is at the pharmacy, call can be transferred to refill team.   1. Which medications need to be refilled? (please list name of each medication and dose if known) frusemide   2. Which pharmacy/location (including street and city if local pharmacy) is medication to be sent to? [facility]   3. Do they need a 30 day or 90 day supply? Patient 15 pills because it will take a few weeks for opt

## 2020-12-06 NOTE — Telephone Encounter (Signed)
Spoke with patient - relayed message from MD below. Apologized for delay, as this RN was out of office Friday 4/15 PM  Refilled meds to [facility]  BMET ordered - will be faxed to [facility] [phone]) to be drawn next week

## 2020-12-07 ENCOUNTER — Other Ambulatory Visit: Payer: Self-pay

## 2020-12-07 DIAGNOSIS — I509 Heart failure, unspecified: Secondary | ICD-10-CM | POA: Diagnosis not present

## 2020-12-07 DIAGNOSIS — G35 Multiple sclerosis: Secondary | ICD-10-CM | POA: Diagnosis not present

## 2020-12-07 DIAGNOSIS — M25571 Pain in right ankle and joints of right foot: Secondary | ICD-10-CM | POA: Diagnosis not present

## 2020-12-07 DIAGNOSIS — Z87440 Personal history of urinary (tract) infections: Secondary | ICD-10-CM | POA: Diagnosis not present

## 2020-12-07 DIAGNOSIS — I110 Hypertensive heart disease with heart failure: Secondary | ICD-10-CM | POA: Diagnosis not present

## 2020-12-07 DIAGNOSIS — J45909 Unspecified asthma, uncomplicated: Secondary | ICD-10-CM | POA: Diagnosis not present

## 2020-12-07 DIAGNOSIS — J9621 Acute and chronic respiratory failure with hypoxia: Secondary | ICD-10-CM | POA: Diagnosis not present

## 2020-12-07 MED ORDER — FUROSEMIDE 20 MG PO TABS: 40.0000 mg | ORAL_TABLET | Freq: Three times a day (TID) | ORAL | 0 refills | Status: DC

## 2020-12-08 ENCOUNTER — Ambulatory Visit (INDEPENDENT_AMBULATORY_CARE_PROVIDER_SITE_OTHER): Payer: Medicare Other | Admitting: Orthopaedic Surgery

## 2020-12-08 ENCOUNTER — Encounter: Payer: Self-pay | Admitting: Orthopaedic Surgery

## 2020-12-08 ENCOUNTER — Ambulatory Visit (INDEPENDENT_AMBULATORY_CARE_PROVIDER_SITE_OTHER): Payer: Medicare Other

## 2020-12-08 DIAGNOSIS — M25571 Pain in right ankle and joints of right foot: Secondary | ICD-10-CM

## 2020-12-08 MED ORDER — DICLOFENAC SODIUM 1 % EX GEL: 2.0000 g | Freq: Three times a day (TID) | CUTANEOUS | 3 refills | Status: DC | PRN

## 2020-12-08 NOTE — Progress Notes (Signed)
The patient is well-known to [name].  She suffers from significant multiple sclerosis.  She is still dealing with bilateral triceps and upper arm pain.  She mainly gets around in a wheelchair.  She has had steroid injections in both shoulders just last month by Dr. Junius Roads.  This helped some.  Most of her pain is when she propels her wheelchair and again is in the trapezius area.  She did injure her right ankle recently.  She has a remote history of a fracture of that ankle before 2 which was treated nonoperative.  The ankle is been painful when she is wearing a Ace wrap around the ankle.  She has a history of a previous cervical fusion from C 3 to C7.  She is on chronic blood thinning medication as well and cannot take anti-inflammatories.  Examination of the right ankle does show diffuse tenderness and swelling with stiffness on range of motion.  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  The ankle mortise is intact but there is degenerative changes.  There is also soft tissue swelling.  We will try a short walking boot for her to use when she stands.  I recommended Voltaren gel for her arms.  She did let me know that she has significant carpal tunnel and cubital tunnel on the left side.  This was confirmed when I look this up on nerve conduction studies that were done recently.  She has been referred to a hand specialist but she wants to hold off on any type of surgery as well.  I agree with her not being a surgical candidate.  From my standpoint follow-up can be as needed.

## 2020-12-10 ENCOUNTER — Telehealth: Payer: Self-pay

## 2020-12-10 DIAGNOSIS — I951 Orthostatic hypotension: Secondary | ICD-10-CM | POA: Diagnosis not present

## 2020-12-10 DIAGNOSIS — Z6832 Body mass index (BMI) 32.0-32.9, adult: Secondary | ICD-10-CM | POA: Diagnosis not present

## 2020-12-10 DIAGNOSIS — M170 Bilateral primary osteoarthritis of knee: Secondary | ICD-10-CM | POA: Diagnosis not present

## 2020-12-10 DIAGNOSIS — E669 Obesity, unspecified: Secondary | ICD-10-CM | POA: Diagnosis not present

## 2020-12-10 DIAGNOSIS — E785 Hyperlipidemia, unspecified: Secondary | ICD-10-CM | POA: Diagnosis not present

## 2020-12-10 DIAGNOSIS — K5904 Chronic idiopathic constipation: Secondary | ICD-10-CM | POA: Diagnosis not present

## 2020-12-10 DIAGNOSIS — I480 Paroxysmal atrial fibrillation: Secondary | ICD-10-CM | POA: Diagnosis not present

## 2020-12-10 DIAGNOSIS — I4821 Permanent atrial fibrillation: Secondary | ICD-10-CM | POA: Diagnosis not present

## 2020-12-10 DIAGNOSIS — N39490 Overflow incontinence: Secondary | ICD-10-CM | POA: Diagnosis not present

## 2020-12-10 DIAGNOSIS — F32A Depression, unspecified: Secondary | ICD-10-CM | POA: Diagnosis not present

## 2020-12-10 DIAGNOSIS — K219 Gastro-esophageal reflux disease without esophagitis: Secondary | ICD-10-CM | POA: Diagnosis not present

## 2020-12-10 DIAGNOSIS — I872 Venous insufficiency (chronic) (peripheral): Secondary | ICD-10-CM | POA: Diagnosis not present

## 2020-12-10 DIAGNOSIS — Z8701 Personal history of pneumonia (recurrent): Secondary | ICD-10-CM | POA: Diagnosis not present

## 2020-12-10 DIAGNOSIS — J45909 Unspecified asthma, uncomplicated: Secondary | ICD-10-CM | POA: Diagnosis not present

## 2020-12-10 DIAGNOSIS — F419 Anxiety disorder, unspecified: Secondary | ICD-10-CM | POA: Diagnosis not present

## 2020-12-10 DIAGNOSIS — I509 Heart failure, unspecified: Secondary | ICD-10-CM | POA: Diagnosis not present

## 2020-12-10 DIAGNOSIS — E039 Hypothyroidism, unspecified: Secondary | ICD-10-CM | POA: Diagnosis not present

## 2020-12-10 DIAGNOSIS — E1151 Type 2 diabetes mellitus with diabetic peripheral angiopathy without gangrene: Secondary | ICD-10-CM | POA: Diagnosis not present

## 2020-12-10 DIAGNOSIS — I8390 Asymptomatic varicose veins of unspecified lower extremity: Secondary | ICD-10-CM | POA: Diagnosis not present

## 2020-12-10 DIAGNOSIS — J9621 Acute and chronic respiratory failure with hypoxia: Secondary | ICD-10-CM | POA: Diagnosis not present

## 2020-12-10 DIAGNOSIS — I110 Hypertensive heart disease with heart failure: Secondary | ICD-10-CM | POA: Diagnosis not present

## 2020-12-10 DIAGNOSIS — C649 Malignant neoplasm of unspecified kidney, except renal pelvis: Secondary | ICD-10-CM | POA: Diagnosis not present

## 2020-12-10 DIAGNOSIS — Z8616 Personal history of COVID-19: Secondary | ICD-10-CM | POA: Diagnosis not present

## 2020-12-10 DIAGNOSIS — G35 Multiple sclerosis: Secondary | ICD-10-CM | POA: Diagnosis not present

## 2020-12-10 DIAGNOSIS — Z87440 Personal history of urinary (tract) infections: Secondary | ICD-10-CM | POA: Diagnosis not present

## 2020-12-10 NOTE — Telephone Encounter (Signed)
Pt left message that she is starting Cipro 500 mg Daily and has INR scheduled for 4/27.  We will f/u then.  I spoke to the patient.

## 2020-12-15 ENCOUNTER — Ambulatory Visit (INDEPENDENT_AMBULATORY_CARE_PROVIDER_SITE_OTHER): Payer: Medicare Other | Admitting: Cardiovascular Disease

## 2020-12-15 DIAGNOSIS — Z7901 Long term (current) use of anticoagulants: Secondary | ICD-10-CM

## 2020-12-15 DIAGNOSIS — Z79899 Other long term (current) drug therapy: Secondary | ICD-10-CM | POA: Diagnosis not present

## 2020-12-15 DIAGNOSIS — I509 Heart failure, unspecified: Secondary | ICD-10-CM | POA: Diagnosis not present

## 2020-12-15 DIAGNOSIS — G35 Multiple sclerosis: Secondary | ICD-10-CM | POA: Diagnosis not present

## 2020-12-15 DIAGNOSIS — J45909 Unspecified asthma, uncomplicated: Secondary | ICD-10-CM | POA: Diagnosis not present

## 2020-12-15 DIAGNOSIS — I110 Hypertensive heart disease with heart failure: Secondary | ICD-10-CM | POA: Diagnosis not present

## 2020-12-15 DIAGNOSIS — Z87440 Personal history of urinary (tract) infections: Secondary | ICD-10-CM | POA: Diagnosis not present

## 2020-12-15 DIAGNOSIS — I5032 Chronic diastolic (congestive) heart failure: Secondary | ICD-10-CM | POA: Diagnosis not present

## 2020-12-15 DIAGNOSIS — J9621 Acute and chronic respiratory failure with hypoxia: Secondary | ICD-10-CM | POA: Diagnosis not present

## 2020-12-15 DIAGNOSIS — I4891 Unspecified atrial fibrillation: Secondary | ICD-10-CM | POA: Diagnosis not present

## 2020-12-15 LAB — POCT INR: INR: 2.8 (ref 2.0–3.0)

## 2020-12-16 ENCOUNTER — Other Ambulatory Visit: Payer: Self-pay

## 2020-12-16 DIAGNOSIS — G35 Multiple sclerosis: Secondary | ICD-10-CM | POA: Diagnosis not present

## 2020-12-16 DIAGNOSIS — I509 Heart failure, unspecified: Secondary | ICD-10-CM | POA: Diagnosis not present

## 2020-12-16 DIAGNOSIS — J9621 Acute and chronic respiratory failure with hypoxia: Secondary | ICD-10-CM | POA: Diagnosis not present

## 2020-12-16 DIAGNOSIS — J45909 Unspecified asthma, uncomplicated: Secondary | ICD-10-CM | POA: Diagnosis not present

## 2020-12-16 DIAGNOSIS — Z87440 Personal history of urinary (tract) infections: Secondary | ICD-10-CM | POA: Diagnosis not present

## 2020-12-16 DIAGNOSIS — I110 Hypertensive heart disease with heart failure: Secondary | ICD-10-CM | POA: Diagnosis not present

## 2020-12-16 LAB — BASIC METABOLIC PANEL
BUN/Creatinine Ratio: 43 — ABNORMAL HIGH (ref 12–28)
BUN: 27 mg/dL (ref 8–27)
CO2: 31 mmol/L — ABNORMAL HIGH (ref 20–29)
Calcium: 9.1 mg/dL (ref 8.7–10.3)
Chloride: 96 mmol/L (ref 96–106)
Creatinine, Ser: 0.63 mg/dL (ref 0.57–1.00)
Glucose: 91 mg/dL (ref 65–99)
Potassium: 4.3 mmol/L (ref 3.5–5.2)
Sodium: 144 mmol/L (ref 134–144)
eGFR: 94 mL/min/{1.73_m2} (ref >59)

## 2020-12-16 MED ORDER — DILTIAZEM HCL 60 MG PO TABS: 60.0000 mg | ORAL_TABLET | ORAL | 3 refills | Status: DC | PRN

## 2020-12-20 DIAGNOSIS — I110 Hypertensive heart disease with heart failure: Secondary | ICD-10-CM | POA: Diagnosis not present

## 2020-12-20 DIAGNOSIS — G35 Multiple sclerosis: Secondary | ICD-10-CM | POA: Diagnosis not present

## 2020-12-20 DIAGNOSIS — Z87440 Personal history of urinary (tract) infections: Secondary | ICD-10-CM | POA: Diagnosis not present

## 2020-12-20 DIAGNOSIS — J9621 Acute and chronic respiratory failure with hypoxia: Secondary | ICD-10-CM | POA: Diagnosis not present

## 2020-12-20 DIAGNOSIS — J45909 Unspecified asthma, uncomplicated: Secondary | ICD-10-CM | POA: Diagnosis not present

## 2020-12-20 DIAGNOSIS — I509 Heart failure, unspecified: Secondary | ICD-10-CM | POA: Diagnosis not present

## 2020-12-21 ENCOUNTER — Telehealth: Payer: Self-pay | Admitting: Neurology

## 2020-12-21 DIAGNOSIS — J9621 Acute and chronic respiratory failure with hypoxia: Secondary | ICD-10-CM | POA: Diagnosis not present

## 2020-12-21 DIAGNOSIS — G35 Multiple sclerosis: Secondary | ICD-10-CM | POA: Diagnosis not present

## 2020-12-21 DIAGNOSIS — Z87440 Personal history of urinary (tract) infections: Secondary | ICD-10-CM | POA: Diagnosis not present

## 2020-12-21 DIAGNOSIS — I509 Heart failure, unspecified: Secondary | ICD-10-CM | POA: Diagnosis not present

## 2020-12-21 DIAGNOSIS — I110 Hypertensive heart disease with heart failure: Secondary | ICD-10-CM | POA: Diagnosis not present

## 2020-12-21 DIAGNOSIS — J45909 Unspecified asthma, uncomplicated: Secondary | ICD-10-CM | POA: Diagnosis not present

## 2020-12-21 NOTE — Telephone Encounter (Signed)
I have printed the download and will have Dr Rexene Alberts review in Dr Dohmeier's absence. I will contact the patient with recommendation and see if the patient can be worked in sooner at that time.

## 2020-12-21 NOTE — Telephone Encounter (Signed)
Pt called, can you download my BIPAP modem. I have been extremely sleepy during the day. Also can you get me a sooner appt than June. Would like a call from the nurse.

## 2020-12-23 DIAGNOSIS — G35 Multiple sclerosis: Secondary | ICD-10-CM | POA: Diagnosis not present

## 2020-12-23 DIAGNOSIS — Z87440 Personal history of urinary (tract) infections: Secondary | ICD-10-CM | POA: Diagnosis not present

## 2020-12-23 DIAGNOSIS — I110 Hypertensive heart disease with heart failure: Secondary | ICD-10-CM | POA: Diagnosis not present

## 2020-12-23 DIAGNOSIS — J9621 Acute and chronic respiratory failure with hypoxia: Secondary | ICD-10-CM | POA: Diagnosis not present

## 2020-12-23 DIAGNOSIS — J45909 Unspecified asthma, uncomplicated: Secondary | ICD-10-CM | POA: Diagnosis not present

## 2020-12-23 DIAGNOSIS — I509 Heart failure, unspecified: Secondary | ICD-10-CM | POA: Diagnosis not present

## 2020-12-23 NOTE — Telephone Encounter (Signed)
Pt called, need a sooner appt with Dr. Brett Fairy  Would like a call from the nurse.

## 2020-12-23 NOTE — Telephone Encounter (Signed)
Called the patient back.  Advised I had our other sleep physician review her download and her events were right at 13 times an hr on the current settings.  Advised that this actually looked really good compared to her previous numbers and the patient was pleased to hear that information.  Patient also was concerned about her upcoming appointment because she has 2 appointments scheduled back to back (one with Dr [name] and the other with Dr Brett Fairy). Due to her health issues, it is hard for her to go 2 days in a row to a doctor's appointments.  Patient was okay with [name] pushing her June appointment out.  We scheduled her for July 18,2022 at 3:30 pm. She was ok with this change. She had no other questions or concerns at this time and was appreciative for the call back and reviewing her download.

## 2020-12-24 ENCOUNTER — Telehealth: Payer: Self-pay | Admitting: Internal Medicine

## 2020-12-24 MED ORDER — DILTIAZEM HCL 60 MG PO TABS: 60.0000 mg | ORAL_TABLET | Freq: Three times a day (TID) | ORAL | 1 refills | Status: DC | PRN

## 2020-12-24 NOTE — Telephone Encounter (Signed)
Rx(s) sent to pharmacy electronically.  

## 2020-12-24 NOTE — Telephone Encounter (Signed)
-----   Message from Pixie Casino, MD sent at 12/24/2020  8:25 AM EDT ----- Regarding: RE: diltiazem PRN frequency That's lot of extra dilt - at 60 mg, no more than TID PRN  Dr Lemmie Evens ----- Message ----- From: Fidel Levy, RN Sent: 12/23/2020   2:36 PM EDT To: Pixie Casino, MD Subject: diltiazem PRN frequency                        Hey!   This patient has Rx for dilt 240mg  QD and 60mg  PRN - will update prescription with [facility]  What should her PRN frequency be? Per chart review, has been TID and also QID in the past  Please advise  Thanks

## 2020-12-27 ENCOUNTER — Telehealth: Payer: Self-pay | Admitting: Internal Medicine

## 2020-12-27 MED ORDER — POTASSIUM CHLORIDE CRYS ER 20 MEQ PO TBCR: 40.0000 meq | EXTENDED_RELEASE_TABLET | Freq: Every day | ORAL | 1 refills | Status: DC

## 2020-12-27 NOTE — Telephone Encounter (Signed)
Spoke with pharmacy and advised that patient has standing daily dose of diltiazem plus PRN diltiazem

## 2020-12-27 NOTE — Telephone Encounter (Signed)
    Pt c/o medication issue:  1. Name of Medication:   diltiazem (CARDIZEM) 60 MG tablet     2. How are you currently taking this medication (dosage and times per day)? Take 1 tablet (60 mg total) by mouth 3 (three) times daily as needed.  3. Are you having a reaction (difficulty breathing--STAT)?   4. What is your medication issue? Peter with [facility] calling, he would like to verify prescription they received for Diltiazem for 60 mg, he said pt is on 240 mg capsule of the same drug. He wanted to know which one the pt should be on

## 2020-12-28 DIAGNOSIS — I110 Hypertensive heart disease with heart failure: Secondary | ICD-10-CM | POA: Diagnosis not present

## 2020-12-28 DIAGNOSIS — J9621 Acute and chronic respiratory failure with hypoxia: Secondary | ICD-10-CM | POA: Diagnosis not present

## 2020-12-28 DIAGNOSIS — Z87440 Personal history of urinary (tract) infections: Secondary | ICD-10-CM | POA: Diagnosis not present

## 2020-12-28 DIAGNOSIS — J45909 Unspecified asthma, uncomplicated: Secondary | ICD-10-CM | POA: Diagnosis not present

## 2020-12-28 DIAGNOSIS — G35 Multiple sclerosis: Secondary | ICD-10-CM | POA: Diagnosis not present

## 2020-12-28 DIAGNOSIS — I509 Heart failure, unspecified: Secondary | ICD-10-CM | POA: Diagnosis not present

## 2020-12-29 ENCOUNTER — Ambulatory Visit (INDEPENDENT_AMBULATORY_CARE_PROVIDER_SITE_OTHER): Payer: Medicare Other | Admitting: Cardiovascular Disease

## 2020-12-29 DIAGNOSIS — Z7901 Long term (current) use of anticoagulants: Secondary | ICD-10-CM | POA: Diagnosis not present

## 2020-12-29 DIAGNOSIS — J9621 Acute and chronic respiratory failure with hypoxia: Secondary | ICD-10-CM | POA: Diagnosis not present

## 2020-12-29 DIAGNOSIS — G35 Multiple sclerosis: Secondary | ICD-10-CM | POA: Diagnosis not present

## 2020-12-29 DIAGNOSIS — I4891 Unspecified atrial fibrillation: Secondary | ICD-10-CM | POA: Diagnosis not present

## 2020-12-29 DIAGNOSIS — I110 Hypertensive heart disease with heart failure: Secondary | ICD-10-CM | POA: Diagnosis not present

## 2020-12-29 DIAGNOSIS — Z87440 Personal history of urinary (tract) infections: Secondary | ICD-10-CM | POA: Diagnosis not present

## 2020-12-29 DIAGNOSIS — I509 Heart failure, unspecified: Secondary | ICD-10-CM | POA: Diagnosis not present

## 2020-12-29 DIAGNOSIS — J45909 Unspecified asthma, uncomplicated: Secondary | ICD-10-CM | POA: Diagnosis not present

## 2020-12-29 LAB — POCT INR: INR: 2.7 (ref 2.0–3.0)

## 2020-12-29 NOTE — Telephone Encounter (Signed)
Pt called, want to cancel my 02/08/21 appt, but would like for [facility] to call me back.

## 2020-12-29 NOTE — Telephone Encounter (Signed)
Called the patient back.  Patient was originally scheduled with Dr. Brett Fairy on June 21 and was hoping her husband could have her appointment for a memory exam.  She states that her primary care should be sending over a referral to have him evaluated.  Informed her that Dr. Brett Fairy actually will be taking vacation that week.  Advised that I do not see a referral in for her husband as of this moment but that we will be looking out for it.  Advised I will place him on a wait list in case something was to open up sooner.

## 2020-12-31 ENCOUNTER — Ambulatory Visit: Payer: Medicare Other

## 2021-01-03 ENCOUNTER — Emergency Department (HOSPITAL_COMMUNITY): Payer: Medicare Other

## 2021-01-03 ENCOUNTER — Encounter (HOSPITAL_COMMUNITY): Payer: Self-pay | Admitting: Emergency Medicine

## 2021-01-03 ENCOUNTER — Telehealth: Payer: Self-pay | Admitting: Neurology

## 2021-01-03 ENCOUNTER — Ambulatory Visit (INDEPENDENT_AMBULATORY_CARE_PROVIDER_SITE_OTHER): Payer: Medicare Other | Admitting: Cardiology

## 2021-01-03 ENCOUNTER — Inpatient Hospital Stay (HOSPITAL_COMMUNITY)
Admission: EM | Admit: 2021-01-03 | Discharge: 2021-01-13 | DRG: 699 | Disposition: A | Discharge status: Skilled Nursing Facility | Payer: Medicare Other | Attending: Family Medicine | Admitting: Family Medicine

## 2021-01-03 ENCOUNTER — Telehealth: Payer: Self-pay

## 2021-01-03 ENCOUNTER — Other Ambulatory Visit: Payer: Self-pay | Admitting: Internal Medicine

## 2021-01-03 ENCOUNTER — Other Ambulatory Visit: Payer: Self-pay

## 2021-01-03 DIAGNOSIS — K219 Gastro-esophageal reflux disease without esophagitis: Secondary | ICD-10-CM | POA: Diagnosis present

## 2021-01-03 DIAGNOSIS — C649 Malignant neoplasm of unspecified kidney, except renal pelvis: Secondary | ICD-10-CM | POA: Diagnosis not present

## 2021-01-03 DIAGNOSIS — I5032 Chronic diastolic (congestive) heart failure: Secondary | ICD-10-CM | POA: Diagnosis present

## 2021-01-03 DIAGNOSIS — N390 Urinary tract infection, site not specified: Secondary | ICD-10-CM | POA: Diagnosis present

## 2021-01-03 DIAGNOSIS — G35 Multiple sclerosis: Secondary | ICD-10-CM | POA: Diagnosis present

## 2021-01-03 DIAGNOSIS — K5900 Constipation, unspecified: Secondary | ICD-10-CM | POA: Diagnosis present

## 2021-01-03 DIAGNOSIS — A419 Sepsis, unspecified organism: Secondary | ICD-10-CM | POA: Diagnosis not present

## 2021-01-03 DIAGNOSIS — Z6833 Body mass index (BMI) 33.0-33.9, adult: Secondary | ICD-10-CM

## 2021-01-03 DIAGNOSIS — E785 Hyperlipidemia, unspecified: Secondary | ICD-10-CM | POA: Diagnosis present

## 2021-01-03 DIAGNOSIS — E119 Type 2 diabetes mellitus without complications: Secondary | ICD-10-CM | POA: Diagnosis present

## 2021-01-03 DIAGNOSIS — Z818 Family history of other mental and behavioral disorders: Secondary | ICD-10-CM

## 2021-01-03 DIAGNOSIS — Z7901 Long term (current) use of anticoagulants: Secondary | ICD-10-CM

## 2021-01-03 DIAGNOSIS — Z888 Allergy status to other drugs, medicaments and biological substances status: Secondary | ICD-10-CM

## 2021-01-03 DIAGNOSIS — Z79899 Other long term (current) drug therapy: Secondary | ICD-10-CM

## 2021-01-03 DIAGNOSIS — E079 Disorder of thyroid, unspecified: Secondary | ICD-10-CM | POA: Diagnosis present

## 2021-01-03 DIAGNOSIS — G4737 Central sleep apnea in conditions classified elsewhere: Secondary | ICD-10-CM

## 2021-01-03 DIAGNOSIS — E662 Morbid (severe) obesity with alveolar hypoventilation: Secondary | ICD-10-CM | POA: Diagnosis not present

## 2021-01-03 DIAGNOSIS — B952 Enterococcus as the cause of diseases classified elsewhere: Secondary | ICD-10-CM | POA: Diagnosis present

## 2021-01-03 DIAGNOSIS — Z20822 Contact with and (suspected) exposure to covid-19: Secondary | ICD-10-CM | POA: Diagnosis not present

## 2021-01-03 DIAGNOSIS — Z86718 Personal history of other venous thrombosis and embolism: Secondary | ICD-10-CM

## 2021-01-03 DIAGNOSIS — J9621 Acute and chronic respiratory failure with hypoxia: Secondary | ICD-10-CM | POA: Diagnosis not present

## 2021-01-03 DIAGNOSIS — F32A Depression, unspecified: Secondary | ICD-10-CM | POA: Diagnosis present

## 2021-01-03 DIAGNOSIS — I110 Hypertensive heart disease with heart failure: Secondary | ICD-10-CM | POA: Diagnosis present

## 2021-01-03 DIAGNOSIS — Z8701 Personal history of pneumonia (recurrent): Secondary | ICD-10-CM

## 2021-01-03 DIAGNOSIS — Z79890 Hormone replacement therapy (postmenopausal): Secondary | ICD-10-CM

## 2021-01-03 DIAGNOSIS — Z87440 Personal history of urinary (tract) infections: Secondary | ICD-10-CM

## 2021-01-03 DIAGNOSIS — Z792 Long term (current) use of antibiotics: Secondary | ICD-10-CM

## 2021-01-03 DIAGNOSIS — I4891 Unspecified atrial fibrillation: Secondary | ICD-10-CM | POA: Diagnosis not present

## 2021-01-03 DIAGNOSIS — I428 Other cardiomyopathies: Secondary | ICD-10-CM | POA: Diagnosis not present

## 2021-01-03 DIAGNOSIS — T85615A Breakdown (mechanical) of other nervous system device, implant or graft, initial encounter: Secondary | ICD-10-CM | POA: Diagnosis present

## 2021-01-03 DIAGNOSIS — Z8249 Family history of ischemic heart disease and other diseases of the circulatory system: Secondary | ICD-10-CM

## 2021-01-03 DIAGNOSIS — Z885 Allergy status to narcotic agent status: Secondary | ICD-10-CM

## 2021-01-03 DIAGNOSIS — I10 Essential (primary) hypertension: Secondary | ICD-10-CM | POA: Diagnosis present

## 2021-01-03 DIAGNOSIS — I509 Heart failure, unspecified: Secondary | ICD-10-CM | POA: Diagnosis not present

## 2021-01-03 DIAGNOSIS — I480 Paroxysmal atrial fibrillation: Secondary | ICD-10-CM | POA: Diagnosis present

## 2021-01-03 DIAGNOSIS — Z8349 Family history of other endocrine, nutritional and metabolic diseases: Secondary | ICD-10-CM

## 2021-01-03 DIAGNOSIS — Z981 Arthrodesis status: Secondary | ICD-10-CM

## 2021-01-03 DIAGNOSIS — B965 Pseudomonas (aeruginosa) (mallei) (pseudomallei) as the cause of diseases classified elsewhere: Secondary | ICD-10-CM | POA: Diagnosis present

## 2021-01-03 DIAGNOSIS — N3289 Other specified disorders of bladder: Secondary | ICD-10-CM

## 2021-01-03 DIAGNOSIS — T83511A Infection and inflammatory reaction due to indwelling urethral catheter, initial encounter: Principal | ICD-10-CM | POA: Diagnosis present

## 2021-01-03 DIAGNOSIS — E249 Cushing's syndrome, unspecified: Secondary | ICD-10-CM | POA: Diagnosis not present

## 2021-01-03 DIAGNOSIS — N319 Neuromuscular dysfunction of bladder, unspecified: Secondary | ICD-10-CM | POA: Diagnosis present

## 2021-01-03 DIAGNOSIS — Z7401 Bed confinement status: Secondary | ICD-10-CM

## 2021-01-03 DIAGNOSIS — L304 Erythema intertrigo: Secondary | ICD-10-CM | POA: Diagnosis present

## 2021-01-03 DIAGNOSIS — E039 Hypothyroidism, unspecified: Secondary | ICD-10-CM | POA: Diagnosis present

## 2021-01-03 DIAGNOSIS — J45909 Unspecified asthma, uncomplicated: Secondary | ICD-10-CM | POA: Diagnosis not present

## 2021-01-03 DIAGNOSIS — E240 Pituitary-dependent Cushing's disease: Secondary | ICD-10-CM | POA: Diagnosis present

## 2021-01-03 LAB — COMPREHENSIVE METABOLIC PANEL
ALT: 15 U/L (ref 0–44)
AST: 19 U/L (ref 15–41)
Albumin: 3.7 g/dL (ref 3.5–5.0)
Alkaline Phosphatase: 57 U/L (ref 38–126)
Anion gap: 9 (ref 5–15)
BUN: 25 mg/dL — ABNORMAL HIGH (ref 8–23)
CO2: 33 mmol/L — ABNORMAL HIGH (ref 22–32)
Calcium: 9.1 mg/dL (ref 8.9–10.3)
Chloride: 98 mmol/L (ref 98–111)
Creatinine, Ser: 0.59 mg/dL (ref 0.44–1.00)
GFR, Estimated: >60 mL/min (ref >60)
Glucose, Bld: 101 mg/dL — ABNORMAL HIGH (ref 70–99)
Potassium: 3.6 mmol/L (ref 3.5–5.1)
Sodium: 140 mmol/L (ref 135–145)
Total Bilirubin: 0.2 mg/dL — ABNORMAL LOW (ref 0.3–1.2)
Total Protein: 6.7 g/dL (ref 6.5–8.1)

## 2021-01-03 LAB — URINALYSIS, ROUTINE W REFLEX MICROSCOPIC
Bilirubin Urine: NEGATIVE
Glucose, UA: NEGATIVE mg/dL
Ketones, ur: NEGATIVE mg/dL
Nitrite: POSITIVE — AB
Protein, ur: NEGATIVE mg/dL
Specific Gravity, Urine: 1.009 (ref 1.005–1.030)
WBC, UA: >50 WBC/hpf — ABNORMAL HIGH (ref 0–5)
pH: 6 (ref 5.0–8.0)

## 2021-01-03 LAB — CBC WITH DIFFERENTIAL/PLATELET
Abs Immature Granulocytes: 0.03 10*3/uL (ref 0.00–0.07)
Basophils Absolute: 0.1 10*3/uL (ref 0.0–0.1)
Basophils Relative: 1 %
Eosinophils Absolute: 0.2 10*3/uL (ref 0.0–0.5)
Eosinophils Relative: 2 %
HCT: 43.8 % (ref 36.0–46.0)
Hemoglobin: 14.3 g/dL (ref 12.0–15.0)
Immature Granulocytes: 0 %
Lymphocytes Relative: 26 %
Lymphs Abs: 2.3 10*3/uL (ref 0.7–4.0)
MCH: 31 pg (ref 26.0–34.0)
MCHC: 32.6 g/dL (ref 30.0–36.0)
MCV: 95 fL (ref 80.0–100.0)
Monocytes Absolute: 1.1 10*3/uL — ABNORMAL HIGH (ref 0.1–1.0)
Monocytes Relative: 13 %
Neutro Abs: 5.2 10*3/uL (ref 1.7–7.7)
Neutrophils Relative %: 58 %
Platelets: 185 10*3/uL (ref 150–400)
RBC: 4.61 MIL/uL (ref 3.87–5.11)
RDW: 12.5 % (ref 11.5–15.5)
WBC: 8.9 10*3/uL (ref 4.0–10.5)
nRBC: 0 % (ref 0.0–0.2)

## 2021-01-03 LAB — LACTIC ACID, PLASMA
Lactic Acid, Venous: 1 mmol/L (ref 0.5–1.9)
Lactic Acid, Venous: 1.1 mmol/L (ref 0.5–1.9)

## 2021-01-03 LAB — PROTIME-INR
INR: 2.9 — ABNORMAL HIGH (ref 0.8–1.2)
Prothrombin Time: 30 seconds — ABNORMAL HIGH (ref 11.4–15.2)

## 2021-01-03 LAB — POCT INR: INR: 3 (ref 2.0–3.0)

## 2021-01-03 LAB — TSH: TSH: 2.961 u[IU]/mL (ref 0.350–4.500)

## 2021-01-03 MED ORDER — LINACLOTIDE 145 MCG PO CAPS
145.0000 ug | ORAL_CAPSULE | Freq: Every day | ORAL | Status: DC
  Administered 2021-01-04 – 2021-01-13 (×11): 145 ug via ORAL
  Filled 2021-01-03 (×10): qty 1

## 2021-01-03 MED ORDER — MELATONIN 5 MG PO TABS
5.0000 mg | ORAL_TABLET | Freq: Every day | ORAL | Status: DC
  Administered 2021-01-03 – 2021-01-12 (×10): 5 mg via ORAL
  Filled 2021-01-03 (×10): qty 1

## 2021-01-03 MED ORDER — SODIUM CHLORIDE 0.9 % IV SOLN
1.0000 g | INTRAVENOUS | Status: DC
  Administered 2021-01-03 – 2021-01-04 (×2): 1 g via INTRAVENOUS
  Filled 2021-01-03: qty 10
  Filled 2021-01-03: qty 1

## 2021-01-03 MED ORDER — VITAMIN D3 25 MCG (1000 UNIT) PO TABS
2000.0000 [IU] | ORAL_TABLET | Freq: Every day | ORAL | Status: DC
  Administered 2021-01-04 – 2021-01-13 (×10): 2000 [IU] via ORAL
  Filled 2021-01-03 (×11): qty 2

## 2021-01-03 MED ORDER — ESCITALOPRAM OXALATE 10 MG PO TABS
10.0000 mg | ORAL_TABLET | Freq: Every day | ORAL | Status: DC
  Administered 2021-01-03 – 2021-01-12 (×10): 10 mg via ORAL
  Filled 2021-01-03 (×10): qty 1

## 2021-01-03 MED ORDER — OMEGA-3-ACID ETHYL ESTERS 1 G PO CAPS
1.0000 g | ORAL_CAPSULE | Freq: Every day | ORAL | Status: DC
  Administered 2021-01-04 – 2021-01-13 (×10): 1 g via ORAL
  Filled 2021-01-03 (×11): qty 1

## 2021-01-03 MED ORDER — OXYBUTYNIN CHLORIDE 5 MG PO TABS
5.0000 mg | ORAL_TABLET | Freq: Three times a day (TID) | ORAL | Status: DC | PRN
  Administered 2021-01-04 – 2021-01-08 (×5): 5 mg via ORAL
  Filled 2021-01-03 (×5): qty 1

## 2021-01-03 MED ORDER — LEVOTHYROXINE SODIUM 50 MCG PO TABS
75.0000 ug | ORAL_TABLET | Freq: Every day | ORAL | Status: DC
  Administered 2021-01-04 – 2021-01-13 (×10): 75 ug via ORAL
  Filled 2021-01-03 (×11): qty 1

## 2021-01-03 MED ORDER — POTASSIUM CHLORIDE CRYS ER 20 MEQ PO TBCR: 40.0000 meq | EXTENDED_RELEASE_TABLET | Freq: Every day | ORAL | 3 refills | Status: AC

## 2021-01-03 MED ORDER — DOCUSATE SODIUM 100 MG PO CAPS
200.0000 mg | ORAL_CAPSULE | Freq: Every day | ORAL | Status: DC | PRN
  Administered 2021-01-05: 200 mg via ORAL
  Filled 2021-01-03: qty 2

## 2021-01-03 MED ORDER — GABAPENTIN 300 MG PO CAPS
300.0000 mg | ORAL_CAPSULE | Freq: Four times a day (QID) | ORAL | Status: DC
  Administered 2021-01-03 – 2021-01-13 (×39): 300 mg via ORAL
  Filled 2021-01-03 (×40): qty 1

## 2021-01-03 MED ORDER — ALPRAZOLAM 0.5 MG PO TABS
0.5000 mg | ORAL_TABLET | Freq: Three times a day (TID) | ORAL | Status: DC | PRN
  Administered 2021-01-03 – 2021-01-13 (×11): 0.5 mg via ORAL
  Filled 2021-01-03 (×12): qty 1

## 2021-01-03 MED ORDER — ESCITALOPRAM OXALATE 10 MG PO TABS: 10.0000 mg | ORAL_TABLET | Freq: Every day | ORAL | Status: DC

## 2021-01-03 MED ORDER — LACTULOSE ENCEPHALOPATHY 10 GM/15ML PO SOLN: 10.0000 g | ORAL | Status: DC

## 2021-01-03 MED ORDER — DILTIAZEM HCL ER COATED BEADS 120 MG PO CP24
240.0000 mg | ORAL_CAPSULE | Freq: Every day | ORAL | Status: DC
  Administered 2021-01-04 – 2021-01-06 (×3): 240 mg via ORAL
  Filled 2021-01-03 (×4): qty 2

## 2021-01-03 MED ORDER — LORAZEPAM 2 MG/ML IJ SOLN
1.0000 mg | Freq: Once | INTRAMUSCULAR | Status: AC
  Administered 2021-01-03: 1 mg via INTRAVENOUS
  Filled 2021-01-03: qty 1

## 2021-01-03 MED ORDER — BUPROPION HCL ER (XL) 150 MG PO TB24
150.0000 mg | ORAL_TABLET | Freq: Every day | ORAL | Status: DC
  Administered 2021-01-04 – 2021-01-13 (×10): 150 mg via ORAL
  Filled 2021-01-03 (×10): qty 1

## 2021-01-03 MED ORDER — BACLOFEN 10 MG PO TABS
20.0000 mg | ORAL_TABLET | Freq: Three times a day (TID) | ORAL | Status: DC | PRN
Start: 2021-01-03 — End: 2021-01-11
  Administered 2021-01-05 – 2021-01-11 (×10): 20 mg via ORAL
  Filled 2021-01-03 (×10): qty 2

## 2021-01-03 MED ORDER — ACETAMINOPHEN 650 MG RE SUPP: 650.0000 mg | Freq: Four times a day (QID) | RECTAL | Status: DC | PRN

## 2021-01-03 MED ORDER — SIMVASTATIN 10 MG PO TABS
10.0000 mg | ORAL_TABLET | Freq: Every evening | ORAL | Status: DC
  Administered 2021-01-03 – 2021-01-12 (×10): 10 mg via ORAL
  Filled 2021-01-03 (×10): qty 1

## 2021-01-03 MED ORDER — TAMSULOSIN HCL 0.4 MG PO CAPS
0.4000 mg | ORAL_CAPSULE | Freq: Every morning | ORAL | Status: DC
  Administered 2021-01-04 – 2021-01-13 (×10): 0.4 mg via ORAL
  Filled 2021-01-03 (×9): qty 1

## 2021-01-03 MED ORDER — ACETAMINOPHEN 325 MG PO TABS
650.0000 mg | ORAL_TABLET | Freq: Four times a day (QID) | ORAL | Status: DC | PRN
  Administered 2021-01-04: 650 mg via ORAL
  Filled 2021-01-03 (×2): qty 2

## 2021-01-03 MED ORDER — FAMOTIDINE 20 MG PO TABS
20.0000 mg | ORAL_TABLET | Freq: Every day | ORAL | Status: DC
  Administered 2021-01-03 – 2021-01-12 (×10): 20 mg via ORAL
  Filled 2021-01-03 (×10): qty 1

## 2021-01-03 NOTE — ED Provider Notes (Signed)
[facility] DEPT Provider Note   CSN: [id number] Arrival date & time: 01/03/21  1502     History Chief Complaint  Patient presents with  . foul urine  . rectal discharge    Toni [name] is a 75 y.o. female.  75 year old female presents with several days foul-smelling urine and increased weakness.  History of urosepsis in the past.  Is currently on Cipro for UTI.  Has had nausea but no vomiting.  Has a home health nurse who felt that she was was more altered today.  Has not had any cough or congestion.  Has chronic diarrhea.  Has had low-grade temp at home up to 99.  Blood pressure at home is 110/38.  Patient feels like her baseline weakness is worse.  Presents via EMS        Past Medical History:  Diagnosis Date  . Abnormality of gait 03/01/2015  . Anxiety   . Arthritis    "knees" (01/19/2016)  . Asthma    as a child   . Back pain   . Bilateral carpal tunnel syndrome 10/04/2020  . Cancer ([place])    / RENAL CELL CARCINOMA - GETS CT SCAN EVERY 6 MONTHS TO WATCH   . CHF (congestive heart failure) ([place])   . Chronic pain    "nerve pain from the MS"  . Complex atypical endometrial hyperplasia   . Constipation   . Depression   . Diabetes mellitus without complication ([place])   . DVT (deep venous thrombosis) ([place]) 1983   "RLE; may have just been phlebitis; it was before the age of dopplers"  . Dysrhythmia    afib   . 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 GERD (gastroesophageal reflux disease)   . HLD (hyperlipidemia)   . Hypertension   . Hypotension   . Hypothyroidism   . Joint pain   . Multiple sclerosis ([place])    has had this may 1989-[place] reg doc  . OSA on CPAP    bipap machine - SLEEP APNEA WORSE SINCE COVID IN 1/21 PER PATIENT SETTING HAS INCREASED FROM 9 TO 30   . Osteoarthritis   . PAF (paroxysmal atrial fibrillation) ([place])    on coumadin; documented on monitor 06/2010  .  Pneumonia 11/2011  . Sleep apnea     Patient Active Problem List   Diagnosis Date Noted  . Bilateral carpal tunnel syndrome 10/04/2020  . 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 Chronic intermittent hypoxia with obstructive sleep apnea 09/13/2020  . COVID-19 long hauler manifesting chronic dyspnea 09/13/2020  . 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 Chronic diastolic CHF (congestive heart failure) ([place]) 09/13/2020  . Acute respiratory failure with hypoxia ([place]) 09/13/2020  . Class 2 severe obesity with serious comorbidity and body mass index (BMI) of 35.0 to 35.9 in adult [facility]) 09/13/2020  . Sepsis ([place]) 06/04/2020  . Acute metabolic encephalopathy 96/29/5284  . Atrial fibrillation with RVR ([place]) 04/07/2020  . Recurrent UTI 04/07/2020  . Uncomplicated degenerative myopia of both eyes 03/19/2020  . 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 Oxygen dependent 02/26/2020  . Neurogenic hypoventilation 02/26/2020  . Complex atypical endometrial hyperplasia   . Acute on chronic respiratory failure with hypoxia ([place]) 10/10/2019  . Multiple sclerosis ([place]) 10/10/2019  . COVID-19 virus infection 08/23/2019  . Bilateral leg weakness 08/04/2019  . Weakness 08/04/2019  . Unilateral primary osteoarthritis, left  knee 01/09/2019  . Type 2 diabetes mellitus without complication, without long-term current use of insulin ([place]) 08/27/2018  . Obstructive sleep apnea treated with bilevel positive airway pressure (BiPAP) 08/15/2018  . 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 ([place]) 11/21/2017  . Permanent atrial fibrillation ([place]) 11/21/2017  . Tightness of leg fascia 11/21/2017  . Muscle spasm of both lower legs 11/21/2017  . Carpal tunnel syndrome, left upper limb 09/13/2017  . Carpal tunnel syndrome, right upper limb 09/13/2017  . Other insomnia 06/11/2017  . Depression 06/11/2017  . Vitamin D deficiency 03/22/2017  . Right hand weakness 08/06/2016   . Nonischemic cardiomyopathy ([place]) 05/09/2016  . Localized edema 05/09/2016  . Long term current use of anticoagulant therapy 02/03/2016  . 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 Obesity hypoventilation syndrome ([place]) 03/30/2015  . Abnormality of gait 03/01/2015  . Chronic constipation with overflow incontinence 01/25/2015  . 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) ([place]) 01/25/2015  . 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 Varicose veins of both lower extremities with complications 58/52/7782  . Long term (current) use of anticoagulants 11/05/2012  . Pseudophakia of both eyes 02/07/2012  . 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 Essential hypertension   . Atrial fibrillation ([place])   . Thyroid disease   . HLD (hyperlipidemia)     Past Surgical History:  Procedure Laterality Date  . ANTERIOR CERVICAL DECOMP/DISCECTOMY FUSION  01/2004  . CARDIAC CATHETERIZATION  06/22/2010   normal coronary arteries, PAF  . CATARACT EXTRACTION, BILATERAL Bilateral   . CHILD BIRTHS     X2  . DILATATION & CURETTAGE/HYSTEROSCOPY WITH MYOSURE N/A 01/15/2020   Procedure: DILATATION & CURETTAGE/HYSTEROSCOPY WITH MYOSURE;  Surgeon: Arvella Nigh, MD;  Location: [facility] ORS;  Service: Gynecology;  Laterality: N/A;  pt in wheelchair-has MS  . DILATION AND CURETTAGE OF UTERUS    . DILATION AND CURETTAGE OF UTERUS N/A 02/16/2020   Procedure: DILATATION AND CURETTAGE;  Surgeon: Lafonda Mosses, MD;  Location: [facility];  Service: Gynecology;  Laterality: N/A;  NOT GENERAL -NO INTUBATION  . INFUSION PUMP IMPLANTATION  ~ 2009   baclofen infusion in lower abd  . INTRAUTERINE DEVICE (IUD) INSERTION N/A 02/16/2020   Procedure: INTRAUTERINE DEVICE (IUD) INSERTION - MIRENA;  Surgeon: Lafonda Mosses, MD;  Location: [facility];  Service: Gynecology;  Laterality: N/A;   . PAIN PUMP REVISION N/A 07/29/2014   Procedure: Baclofen pump replacement;  Surgeon: Erline Levine, MD;  Location: [facility]UR[facility] ORS;  Service: Neurosurgery;  Laterality: N/A;  Baclofen pump replacement  . TONSILLECTOMY AND ADENOIDECTOMY    . VARICOSE VEIN SURGERY  ~ 1968  . VENOUS ABLATION  12/16/2010   radiofreq ablation -Dr Elisabeth Cara and [facility]  . WISDOM TOOTH EXTRACTION       OB History    Gravida  2   Para      Term      Preterm      AB      Living        SAB      IAB      Ectopic      Multiple      Live Births              Family History  Problem Relation Age of Onset  . Coronary artery disease Father        at age 27  .  Hyperlipidemia Father   . Thyroid disease Father   . Coronary artery disease Maternal Grandmother   . Depression Maternal Grandmother   . Cancer Paternal Grandmother        breast  . Depression Mother   . Sudden death Mother   . Anxiety disorder Mother   . Alcoholism Son     Social History   Tobacco Use  . Smoking status: Former Smoker    Packs/day: 0.50    Years: 7.00    Pack years: 3.50    Types: Cigarettes    Quit date: 08/21/1976    Years since quitting: 44.4  . Smokeless tobacco: Never Used  Vaping Use  . Vaping Use: Never used  Substance Use Topics  . Alcohol use: Not Currently  . Drug use: No    Home Medications Prior to Admission medications   Medication Sig Start Date End Date Taking? Authorizing Provider  acetaminophen (TYLENOL) 500 MG tablet Take 500-1,000 mg by mouth every 6 (six) hours as needed for fever or headache (or pain).    [provider]  ALPRAZolam (XANAX) 0.5 MG tablet TAKE 1 TABLET BY MOUTH 3  TIMES DAILY AS NEEDED FOR  ANXIETY AND SLEEP 10/18/20   Dohmeier, Asencion Partridge, MD  baclofen (LIORESAL) 20 MG tablet Take 1 tablet (20 mg total) by mouth 2 (two) times daily as needed for muscle spasms. 07/17/20 10/10/21  Penumalli, Earlean Polka, MD  BIOTIN PO Take 1 tablet by mouth daily.    [provider]  buPROPion (WELLBUTRIN XL) 150 MG 24 hr tablet Take 1 tablet (150 mg total) by mouth daily. 10/18/20   Dohmeier, Asencion Partridge, MD  Calcium Carb-Cholecalciferol (CALCIUM + D3 PO) Take 2 tablets by mouth in the morning and at bedtime.    [provider]  cephALEXin (KEFLEX) 250 MG capsule Take 250 mg by mouth daily. 10/06/20   [provider]  cetirizine (ZYRTEC) 10 MG tablet Take 10 mg by mouth daily as needed for allergies.     [provider]  Cholecalciferol (VITAMIN D3) 50 MCG (2000 UT) TABS Take 2,000 Units by mouth in the morning and at bedtime.    [provider]  Coenzyme Q10 (CO Q-10 PO) Take 1 capsule by mouth daily.     [provider]  corticotropin (ACTHAR) 80 UNIT/ML injectable gel 10 days of 80 mg daily- one course - 09/13/20   Dohmeier, Asencion Partridge, MD  diclofenac Sodium (VOLTAREN) 1 % GEL Apply 2 g topically 3 (three) times daily as needed. 12/08/20   Mcarthur Rossetti, MD  diltiazem (CARDIZEM CD) 240 MG 24 hr capsule Take 1 capsule (240 mg total) by mouth daily. 06/18/20 07/18/20  Deberah Pelton, NP  diltiazem (CARDIZEM) 60 MG tablet Take 1 tablet (60 mg total) by mouth 3 (three) times daily as needed. 12/24/20   Hilty, Nadean Corwin, MD  docusate sodium (COLACE) 100 MG capsule Take 1 capsule (100 mg total) by mouth daily. Patient taking differently: Take 200 mg by mouth daily as needed for mild constipation. 08/12/16   Rai, Ripudeep K, MD  escitalopram (LEXAPRO) 10 MG tablet Take 1 tablet (10 mg total) by mouth daily. 10/18/20   Dohmeier, Asencion Partridge, MD  famotidine (PEPCID) 20 MG tablet Take 20 mg by mouth at bedtime.  06/11/18   [provider]  furosemide (LASIX) 20 MG tablet Take 2 tablets (40 mg total) by mouth in the morning, at noon, and at bedtime. 12/07/20   Hilty, Nadean Corwin, MD  gabapentin (NEURONTIN) 300 MG capsule TAKE 1 CAPSULE BY MOUTH UP  TO 4 TIMES DAILY Patient taking differently: Take 300 mg by mouth every 6 (six) hours.  03/02/20   Dohmeier, Porfirio Mylar, MD  hydrocortisone 2.5 % cream Apply topically. 07/30/20   [provider]  lactulose, encephalopathy, (ENULOSE) 10 GM/15ML SOLN Take 15 mLs (10 g total) by mouth See admin instructions. Take 10 grams by mouth once a day and an additional 10 grams once daily, as needed to move the bowels 11/15/20   Dohmeier, Porfirio Mylar, MD  levothyroxine (SYNTHROID, LEVOTHROID) 75 MCG tablet Take 75 mcg by mouth daily before breakfast.     [provider]  linaclotide (LINZESS) 145 MCG CAPS capsule Take 1 capsule (145 mcg total) by mouth daily before breakfast. 04/20/20   Tressia Danas, MD  melatonin 5 MG TABS Take 5 mg by mouth at bedtime.    [provider]  midodrine (PROAMATINE) 5 MG tablet Take 1 tablet (5 mg total) by mouth 3 (three) times daily with meals. 09/30/19   Hilty, Lisette Abu, MD  Omega-3 Fatty Acids (FISH OIL PO) Take 1 capsule by mouth daily.    [provider]  omeprazole (PRILOSEC) 20 MG capsule Take 20 mg by mouth daily before breakfast.     [provider]  ondansetron (ZOFRAN) 8 MG tablet Take 8 mg by mouth every morning.  01/12/20   [provider]  oxybutynin (DITROPAN) 5 MG tablet Take 5 mg by mouth 3 (three) times daily as needed for bladder spasms.  02/09/20   [provider]  potassium chloride SA (KLOR-CON) 20 MEQ tablet Take 2 tablets (40 mEq total) by mouth daily. 12/27/20   Hilty, Lisette Abu, MD  psyllium (METAMUCIL SMOOTH TEXTURE) 28 % packet Take 1 packet by mouth every evening.     [provider]  simvastatin (ZOCOR) 10 MG tablet Take 10 mg by mouth every evening.  03/01/18   [provider]  SYRINGE/NEEDLE, DISP, 1 ML (BD LUER-LOK SYRINGE) 25G X 5/8" 1 ML MISC Use as directed to inject  [place] 06/15/15   Dohmeier, Porfirio Mylar, MD  Tamsulosin HCl (FLOMAX) 0.4 MG CAPS Take 0.4 mg by mouth in the morning.    [provider]  traMADol (ULTRAM) 50 MG tablet Take 1 tablet by mouth  every 8 (eight) hours as needed. 09/18/20   [provider]  warfarin (COUMADIN) 5 MG tablet TAKE 1/2 TO 1 TABLET BY  MOUTH DAILY AS DIRECTED BY  COUMADIN CLINIC 09/13/20   Hilty, Lisette Abu, MD    Allergies    Chlorhexidine, Hydrocodone, Oxycodone, and Sertraline  Review of Systems   Review of Systems  All other systems reviewed and are negative.   Physical Exam Updated Vital Signs BP 139/75   Pulse 74   Temp 98.8 F (37.1 C) (Oral)   Resp 19   Ht 1.575 m (5\' 2" )   Wt 83 kg   SpO2 97%   BMI 33.47 kg/m   Physical Exam Vitals and nursing note reviewed.  Constitutional:      General: She is not in acute distress.    Appearance: Normal appearance. She is well-developed. She is not toxic-appearing.  HENT:     Head: Normocephalic and atraumatic.  Eyes:     General: Lids are normal.     Conjunctiva/sclera: Conjunctivae normal.     Pupils: Pupils are equal, round, and reactive to light.  Neck:     Thyroid: No thyroid mass.  Trachea: No tracheal deviation.  Cardiovascular:     Rate and Rhythm: Normal rate and regular rhythm.     Heart sounds: Normal heart sounds. No murmur heard. No gallop.   Pulmonary:     Effort: Pulmonary effort is normal. No respiratory distress.     Breath sounds: Normal breath sounds. No stridor. No decreased breath sounds, wheezing, rhonchi or rales.  Abdominal:     General: Bowel sounds are normal. There is no distension.     Palpations: Abdomen is soft.     Tenderness: There is no abdominal tenderness. There is no rebound.  Musculoskeletal:        General: No tenderness. Normal range of motion.     Cervical back: Normal range of motion and neck supple.  Skin:    General: Skin is warm and dry.     Findings: No abrasion or rash.  Neurological:     Mental Status: She is alert and oriented to person, place, and time.     GCS: GCS eye subscore is 4. GCS verbal subscore is 5. GCS motor subscore is 6.     Cranial Nerves: No cranial nerve  deficit.     Sensory: No sensory deficit.     Motor: No tremor.     Comments: Strength is 5 of 5 in bilateral upper extremities.  Also 5 of 5 in left lower extremity somewhat weaker in the right lower extremity but this is her baseline according to the daughter.  Psychiatric:        Attention and Perception: Attention normal.        Mood and Affect: Affect is flat.        Speech: Speech normal.        Behavior: Behavior normal.     ED Results / Procedures / Treatments   Labs (all labs ordered are listed, but only abnormal results are displayed) Labs Reviewed  CULTURE, BLOOD (ROUTINE X 2)  CULTURE, BLOOD (ROUTINE X 2)  COMPREHENSIVE METABOLIC PANEL  LACTIC ACID, PLASMA  LACTIC ACID, PLASMA  CBC WITH DIFFERENTIAL/PLATELET  PROTIME-INR  URINALYSIS, ROUTINE W REFLEX MICROSCOPIC    EKG None  Radiology DG Chest 1 View  Result Date: 01/03/2021 CLINICAL DATA:  UTI EXAM: CHEST  1 VIEW COMPARISON:  11/04/2018 FINDINGS: Hardware in the cervical spine. No focal opacity or pleural effusion. Stable cardiomediastinal silhouette. No pneumothorax. IMPRESSION: No active disease. Electronically Signed   By: Donavan Foil M.D.   On: 01/03/2021 16:24    Procedures Procedures   Medications Ordered in ED Medications - No data to display  ED Course  I have reviewed the triage vital signs and the nursing notes.  Pertinent labs & imaging results that were available during my care of the patient were reviewed by me and considered in my medical decision making (see chart for details).    MDM Rules/Calculators/A&P                         Chest x-ray without acute findings. Patient's urinalysis continues to show infection.  Lactate is normal here.  Started on IV antibiotics.  Patient has a baclofen pump which has been malfunctioning for quite some time.  Discussed with Dr. Leonie Man from neurology who recommends that inpatient neuro hospitalist team be consulted if after treatment of patient's UTI  does not improve her spasms.  Patient given 1 mg of Ativan for this.  Plan will be for admission Final Clinical Impression(s) / ED  Diagnoses Final diagnoses:  Sepsis [facility])    Rx / [place] Orders ED Discharge Orders    None       Lacretia Leigh, MD 01/03/21 1939

## 2021-01-03 NOTE — ED Provider Notes (Signed)
Emergency Medicine Provider Triage Evaluation Note  Toni [name] , a 75 y.o. female  was evaluated in triage.  Pt complains of dysuria.  Review of Systems  Positive: Fever, report hypotension at home, abdominal discomfort, nausea Negative: Cp, sob  Physical Exam  BP 130/66 (BP Location: Right Arm)   Pulse 71   Temp 98.8 F (37.1 C) (Oral)   Resp 14   SpO2 98%  Gen:   Awake, no distress, ill appearing Resp:  Normal effort  MSK:   Able to move extremities with poor effort Other:  Suprapubic tenderness, indwelling foley  Medical Decision Making  Medically screening exam initiated at 3:20 PM.  Appropriate orders placed.  Toni [name] was informed that the remainder of the evaluation will be completed by another provider, this initial triage assessment does not replace that evaluation, and the importance of remaining in the ED until their evaluation is complete.  Hx of MS, and hx of urosepsis who is here with report of fever, low blood pressure, sensation of urinary discomfort despite having a foley catheter.  Not feeling well x 3 days.     Domenic Moras, PA-C 01/03/21 1523    Gareth Morgan, MD 01/05/21 [phone]

## 2021-01-03 NOTE — ED Notes (Signed)
Lauren, RN aware of patients daughter coming upstairs to get patient settled in, then understands visiting hours are over for tonight.

## 2021-01-03 NOTE — H&P (Signed)
History and Physical    Toni [name] FAO:[id number] DOB: 08/01/1946 DOA: 01/03/2021  PCP: Prince Solian, MD  Patient coming from: Home.  Chief Complaint: Back and lower extremity muscle spasms and low normal blood pressure.  HPI: Toni [name] is a 75 y.o. female with history of multiple sclerosis largely bedbound, A. fib on Coumadin and Cardizem, hypothyroidism, depression who is on chronic indwelling Foley catheter was brought to the ER after patient's home health aide found the patient blood pressure was in the low normal and also was running a temperature of 99 F.  Patient also has been having increasing low back spasms and lower extremity spasms and it was felt that patient's baclofen pump was not working properly.  Patient has been having recurrent urinary tract infections for which patient is following with urologist.  On chronic suppressive antibiotic therapy and also recently placed on ciprofloxacin for the last 4 days.  ED Course: In the ER patient was having a temperature of 99 F lactic acid was normal.  Blood work was largely within normal limits.  UA is consistent with UTI.  Given the significant spasms involving the lower extremities and low back with UTI and fever recorded at home patient admitted for antibiotics and further observation.  Neurologist Dr. Leonie Man consulted by the ER physician.  At this time requested treating UTI and if not getting better to get neuro hospitalist to see patient.  COVID test was negative.  Review of Systems: As per HPI, rest all negative.   Past Medical History:  Diagnosis Date  . Abnormality of gait 03/01/2015  . Anxiety   . Arthritis    "knees" (01/19/2016)  . Asthma    as a child   . Back pain   . Bilateral carpal tunnel syndrome 10/04/2020  . Cancer ([place])    / RENAL CELL CARCINOMA - GETS CT SCAN EVERY 6 MONTHS TO WATCH   . CHF (congestive heart failure) ([place])   . Chronic pain    "nerve pain from the MS"  . Complex atypical  endometrial hyperplasia   . Constipation   . Depression   . Diabetes mellitus without complication ([place])   . DVT (deep venous thrombosis) ([place]) 1983   "RLE; may have just been phlebitis; it was before the age of dopplers"  . Dysrhythmia    afib   . 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 GERD (gastroesophageal reflux disease)   . HLD (hyperlipidemia)   . Hypertension   . Hypotension   . Hypothyroidism   . Joint pain   . Multiple sclerosis ([place])    has had this may 1989-Dohmeir reg doc  . OSA on CPAP    bipap machine - SLEEP APNEA WORSE SINCE COVID IN 1/21 PER PATIENT SETTING HAS INCREASED FROM 9 TO 30   . Osteoarthritis   . PAF (paroxysmal atrial fibrillation) ([place])    on coumadin; documented on monitor 06/2010  . Pneumonia 11/2011  . Sleep apnea     Past Surgical History:  Procedure Laterality Date  . ANTERIOR CERVICAL DECOMP/DISCECTOMY FUSION  01/2004  . CARDIAC CATHETERIZATION  06/22/2010   normal coronary arteries, PAF  . CATARACT EXTRACTION, BILATERAL Bilateral   . CHILD BIRTHS     X2  . DILATATION & CURETTAGE/HYSTEROSCOPY WITH MYOSURE N/A 01/15/2020   Procedure: DILATATION & CURETTAGE/HYSTEROSCOPY WITH MYOSURE;  Surgeon: Arvella Nigh, MD;  Location: [facility];  Service:  Gynecology;  Laterality: N/A;  pt in wheelchair-has MS  . DILATION AND CURETTAGE OF UTERUS    . DILATION AND CURETTAGE OF UTERUS N/A 02/16/2020   Procedure: DILATATION AND CURETTAGE;  Surgeon: Lafonda Mosses, MD;  Location: [facility];  Service: Gynecology;  Laterality: N/A;  NOT GENERAL -NO INTUBATION  . INFUSION PUMP IMPLANTATION  ~ 2009   baclofen infusion in lower abd  . INTRAUTERINE DEVICE (IUD) INSERTION N/A 02/16/2020   Procedure: INTRAUTERINE DEVICE (IUD) INSERTION - MIRENA;  Surgeon: Lafonda Mosses, MD;  Location: [facility];  Service: Gynecology;  Laterality: N/A;  . PAIN PUMP REVISION N/A 07/29/2014   Procedure: Baclofen pump replacement;   Surgeon: Erline Levine, MD;  Location: [facility];  Service: Neurosurgery;  Laterality: N/A;  Baclofen pump replacement  . TONSILLECTOMY AND ADENOIDECTOMY    . VARICOSE VEIN SURGERY  ~ 1968  . VENOUS ABLATION  12/16/2010   radiofreq ablation -Dr Elisabeth Cara and [name]  . WISDOM TOOTH EXTRACTION       reports that she quit smoking about 44 years ago. Her smoking use included cigarettes. She has a 3.50 pack-year smoking history. She has never used smokeless tobacco. She reports previous alcohol use. She reports that she does not use drugs.  Allergies  Allergen Reactions  . Chlorhexidine Hives and Other (See Comments)    And, sores appear where applied  . Hydrocodone Nausea And Vomiting and Other (See Comments)    Hydrocodone causes vomiting Hydrocodone causes vomiting  . Oxycodone Nausea And Vomiting and Other (See Comments)    Oral oxycodone causes vomiting  . Sertraline Hives, Swelling, Rash and Other (See Comments)    [DERM][OTHER]RASH WITH SWELLING    Family History  Problem Relation Age of Onset  . Coronary artery disease Father        at age 24  . Hyperlipidemia Father   . Thyroid disease Father   . Coronary artery disease Maternal Grandmother   . Depression Maternal Grandmother   . Cancer Paternal Grandmother        breast  . Depression Mother   . Sudden death Mother   . Anxiety disorder Mother   . Alcoholism Son     Prior to Admission medications   Medication Sig Start Date End Date Taking? Authorizing Provider  acetaminophen (TYLENOL) 500 MG tablet Take 500-1,000 mg by mouth every 6 (six) hours as needed for fever or headache (or pain).    [provider]  ALPRAZolam (XANAX) 0.5 MG tablet TAKE 1 TABLET BY MOUTH 3  TIMES DAILY AS NEEDED FOR  ANXIETY AND SLEEP 10/18/20   Dohmeier, Asencion Partridge, MD  baclofen (LIORESAL) 20 MG tablet Take 1 tablet (20 mg total) by mouth 2 (two) times daily as needed for muscle spasms. 07/17/20 10/10/21  Penumalli, Earlean Polka, MD  BIOTIN PO  Take 1 tablet by mouth daily.    [provider]  buPROPion (WELLBUTRIN XL) 150 MG 24 hr tablet Take 1 tablet (150 mg total) by mouth daily. 10/18/20   Dohmeier, Asencion Partridge, MD  Calcium Carb-Cholecalciferol (CALCIUM + D3 PO) Take 2 tablets by mouth in the morning and at bedtime.    [provider]  cephALEXin (KEFLEX) 250 MG capsule Take 250 mg by mouth daily. 10/06/20   [provider]  cetirizine (ZYRTEC) 10 MG tablet Take 10 mg by mouth daily as needed for allergies.     [provider]  Cholecalciferol (VITAMIN D3) 50 MCG (2000 UT) TABS Take 2,000 Units by mouth  in the morning and at bedtime.    [provider]  Coenzyme Q10 (CO Q-10 PO) Take 1 capsule by mouth daily.     [provider]  corticotropin (ACTHAR) 80 UNIT/ML injectable gel 10 days of 80 mg daily- one course - 09/13/20   Dohmeier, Asencion Partridge, MD  diclofenac Sodium (VOLTAREN) 1 % GEL Apply 2 g topically 3 (three) times daily as needed. 12/08/20   Mcarthur Rossetti, MD  diltiazem (CARDIZEM CD) 240 MG 24 hr capsule Take 1 capsule (240 mg total) by mouth daily. 06/18/20 07/18/20  Deberah Pelton, NP  diltiazem (CARDIZEM) 60 MG tablet Take 1 tablet (60 mg total) by mouth 3 (three) times daily as needed. 12/24/20   Hilty, Nadean Corwin, MD  docusate sodium (COLACE) 100 MG capsule Take 1 capsule (100 mg total) by mouth daily. Patient taking differently: Take 200 mg by mouth daily as needed for mild constipation. 08/12/16   Rai, Ripudeep K, MD  escitalopram (LEXAPRO) 10 MG tablet Take 1 tablet (10 mg total) by mouth daily. 10/18/20   Dohmeier, Asencion Partridge, MD  famotidine (PEPCID) 20 MG tablet Take 20 mg by mouth at bedtime.  06/11/18   [provider]  furosemide (LASIX) 20 MG tablet Take 2 tablets (40 mg total) by mouth in the morning, at noon, and at bedtime. 12/07/20   Hilty, Nadean Corwin, MD  gabapentin (NEURONTIN) 300 MG capsule TAKE 1 CAPSULE BY MOUTH UP  TO 4 TIMES DAILY Patient taking  differently: Take 300 mg by mouth every 6 (six) hours. 03/02/20   Dohmeier, Asencion Partridge, MD  hydrocortisone 2.5 % cream Apply topically. 07/30/20   [provider]  lactulose, encephalopathy, (ENULOSE) 10 GM/15ML SOLN Take 15 mLs (10 g total) by mouth See admin instructions. Take 10 grams by mouth once a day and an additional 10 grams once daily, as needed to move the bowels 11/15/20   Dohmeier, Asencion Partridge, MD  levothyroxine (SYNTHROID, LEVOTHROID) 75 MCG tablet Take 75 mcg by mouth daily before breakfast.     [provider]  linaclotide (LINZESS) 145 MCG CAPS capsule Take 1 capsule (145 mcg total) by mouth daily before breakfast. 04/20/20   Thornton Park, MD  melatonin 5 MG TABS Take 5 mg by mouth at bedtime.    [provider]  midodrine (PROAMATINE) 5 MG tablet Take 1 tablet (5 mg total) by mouth 3 (three) times daily with meals. 09/30/19   Hilty, Nadean Corwin, MD  Omega-3 Fatty Acids (FISH OIL PO) Take 1 capsule by mouth daily.    [provider]  omeprazole (PRILOSEC) 20 MG capsule Take 20 mg by mouth daily before breakfast.     [provider]  ondansetron (ZOFRAN) 8 MG tablet Take 8 mg by mouth every morning.  01/12/20   [provider]  oxybutynin (DITROPAN) 5 MG tablet Take 5 mg by mouth 3 (three) times daily as needed for bladder spasms.  02/09/20   [provider]  potassium chloride SA (KLOR-CON) 20 MEQ tablet Take 2 tablets (40 mEq total) by mouth daily. 01/03/21   Hilty, Nadean Corwin, MD  psyllium (METAMUCIL SMOOTH TEXTURE) 28 % packet Take 1 packet by mouth every evening.     [provider]  simvastatin (ZOCOR) 10 MG tablet Take 10 mg by mouth every evening.  03/01/18   [provider]  SYRINGE/NEEDLE, DISP, 1 ML (BD LUER-LOK SYRINGE) 25G X 5/8" 1 ML MISC Use as directed to inject  [place] 06/15/15   Dohmeier, [place],  MD  Tamsulosin HCl (FLOMAX) 0.4 MG CAPS Take 0.4 mg by mouth in the morning.    [provider]   traMADol (ULTRAM) 50 MG tablet Take 1 tablet by mouth every 8 (eight) hours as needed. 09/18/20   [provider]  warfarin (COUMADIN) 5 MG tablet TAKE 1/2 TO 1 TABLET BY  MOUTH DAILY AS DIRECTED BY  COUMADIN CLINIC 09/13/20   Hilty, Nadean Corwin, MD    Physical Exam: Constitutional: Moderately built and nourished. Vitals:   01/03/21 1856 01/03/21 1900 01/03/21 1945 01/03/21 2102  BP: 135/81 (!) 145/84 134/83 125/61  Pulse: 89 85 93 98  Resp: 19 20 17  (!) 21  Temp:      TempSrc:      SpO2: 97% 95% 94% 95%  Weight:      Height:       Eyes: Anicteric no pallor. ENMT: No discharge from the ears eyes nose and mouth. Neck: No mass felt.  No neck rigidity. Respiratory: No rhonchi or crepitations. Cardiovascular: S1-S2 heard. Abdomen: Soft nontender bowel sounds present. Musculoskeletal: No edema. Skin: No rash. Neurologic: Alert awake oriented to time place and person. Psychiatric: Appears normal.  Normal affect.   Labs on Admission: I have personally reviewed following labs and imaging studies  CBC: Recent Labs  Lab 01/03/21 1600  WBC 8.9  NEUTROABS 5.2  HGB 14.3  HCT 43.8  MCV 95.0  PLT 123XX123   Basic Metabolic Panel: Recent Labs  Lab 01/03/21 1600  NA 140  K 3.6  CL 98  CO2 33*  GLUCOSE 101*  BUN 25*  CREATININE 0.59  CALCIUM 9.1   GFR: Estimated Creatinine Clearance: 62.6 mL/min (by C-G formula based on SCr of 0.59 mg/dL). Liver Function Tests: Recent Labs  Lab 01/03/21 1600  AST 19  ALT 15  ALKPHOS 57  BILITOT 0.2*  PROT 6.7  ALBUMIN 3.7   No results for input(s): LIPASE, AMYLASE in the last 168 hours. No results for input(s): AMMONIA in the last 168 hours. Coagulation Profile: Recent Labs  Lab 12/29/20 1127 01/03/21 1317 01/03/21 1600  INR 2.7 3.0 2.9*   Cardiac Enzymes: No results for input(s): CKTOTAL, CKMB, CKMBINDEX, TROPONINI in the last 168 hours. BNP (last 3 results) No results for input(s): PROBNP in the last 8760  hours. HbA1C: No results for input(s): HGBA1C in the last 72 hours. CBG: No results for input(s): GLUCAP in the last 168 hours. Lipid Profile: No results for input(s): CHOL, HDL, LDLCALC, TRIG, CHOLHDL, LDLDIRECT in the last 72 hours. Thyroid Function Tests: Recent Labs    01/03/21 1600  TSH 2.961   Anemia Panel: No results for input(s): VITAMINB12, FOLATE, FERRITIN, TIBC, IRON, RETICCTPCT in the last 72 hours. Urine analysis:    Component Value Date/Time   COLORURINE YELLOW 01/03/2021 1600   APPEARANCEUR CLEAR 01/03/2021 1600   APPEARANCEUR Clear 12/18/2014 1156   LABSPEC 1.009 01/03/2021 1600   PHURINE 6.0 01/03/2021 1600   GLUCOSEU NEGATIVE 01/03/2021 1600   HGBUR SMALL (A) 01/03/2021 1600   BILIRUBINUR NEGATIVE 01/03/2021 1600   BILIRUBINUR Negative 12/18/2014 1156   KETONESUR NEGATIVE 01/03/2021 1600   PROTEINUR NEGATIVE 01/03/2021 1600   UROBILINOGEN 0.2 08/05/2014 0951   NITRITE POSITIVE (A) 01/03/2021 1600   LEUKOCYTESUR LARGE (A) 01/03/2021 1600   Sepsis Labs: @LABRCNTIP (procalcitonin:4,lacticidven:4) )No results found for this or any previous visit (from the past 240 hour(s)).   Radiological Exams on Admission: DG Chest 1 View  Result Date: 01/03/2021 CLINICAL DATA:  UTI EXAM: CHEST  1 VIEW COMPARISON:  11/04/2018 FINDINGS: Hardware in the cervical spine. No focal opacity or pleural effusion. Stable cardiomediastinal silhouette. No pneumothorax. IMPRESSION: No active disease. Electronically Signed   By: Donavan Foil M.D.   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 Bilateral lower extremity muscle spasms with UTI at this time neurologist recommended treating UTI and giving p.o. baclofen.  If symptoms does not improve then to consult neurologist.  Follow urine cultures.  Patient is on ceftriaxone.  Note that patient is already having a baclofen pump which at this time that was concerning for his functioning.  Patient has chronic indwelling Foley catheter placed about 2 weeks ago.  I have requested the nurse to change. 2. A. fib presently rate controlled.  On Cardizem and Coumadin. 3. Central sleep apnea and obesity hypoventilation on BiPAP at bedtime. 4. Hyperlipidemia on statins. 5. Multiple sclerosis continue home medications. 6. Depression on Lexapro and Wellbutrin.   DVT prophylaxis: Coumadin. Code Status: Full code. Family Communication: Patient's daughter. Disposition Plan: Home. Consults called: ER physician discussed with neurologist. Admission status: Observation.   Rise Patience MD [facility] Pager [phone].  If 7PM-7AM, please contact night-coverage [email or web address] Password [email or web address]  01/03/2021, 9:20 PM

## 2021-01-03 NOTE — ED Triage Notes (Addendum)
Patient presents with chronic UTI and currently is on PO antibiotics. Medication has not helped. Home health RN reports BP at 104/52. Patient reports a white discharge from her rectum, foul smelling urine with sediment. Patient uses a urinary catheter.     EMS vitals: 126/62 BP 113 CBG 94 HR 16 RR 97% O2 sat on room air 99.5 Temp

## 2021-01-03 NOTE — Telephone Encounter (Signed)
I returned call to answering service from patient's daughter and spoke to her.  Patient was currently at [facility], ER being evaluated for increased tremulousness, fever and spasms.  Lab work suggested UTI and plans were for her to be admitted.  Daughter expressed concern that she feels patient's baclofen pump is not working correctly and Dr. [name] had also raised some concerns about this at recent visit.  I explained to her that she can be managed with increased dose of oral baclofen if necessary but her recent symptoms are likely related to her UTI and should improve.  If concerns about baclofen pump remain neuro hospitalist team can be consulted while she is in the hospital and she can follow-up with Dr. [name] for further advice about pump management as an outpatient.  I also spoke to ER physician Dr. Vivi Martens who expressed satisfaction with the stated plan above.

## 2021-01-04 DIAGNOSIS — B965 Pseudomonas (aeruginosa) (mallei) (pseudomallei) as the cause of diseases classified elsewhere: Secondary | ICD-10-CM | POA: Diagnosis present

## 2021-01-04 DIAGNOSIS — Z6833 Body mass index (BMI) 33.0-33.9, adult: Secondary | ICD-10-CM | POA: Diagnosis not present

## 2021-01-04 DIAGNOSIS — C649 Malignant neoplasm of unspecified kidney, except renal pelvis: Secondary | ICD-10-CM | POA: Diagnosis present

## 2021-01-04 DIAGNOSIS — N319 Neuromuscular dysfunction of bladder, unspecified: Secondary | ICD-10-CM | POA: Diagnosis present

## 2021-01-04 DIAGNOSIS — J9611 Chronic respiratory failure with hypoxia: Secondary | ICD-10-CM | POA: Diagnosis not present

## 2021-01-04 DIAGNOSIS — F419 Anxiety disorder, unspecified: Secondary | ICD-10-CM | POA: Diagnosis not present

## 2021-01-04 DIAGNOSIS — Z978 Presence of other specified devices: Secondary | ICD-10-CM | POA: Diagnosis not present

## 2021-01-04 DIAGNOSIS — L89152 Pressure ulcer of sacral region, stage 2: Secondary | ICD-10-CM | POA: Diagnosis not present

## 2021-01-04 DIAGNOSIS — G4700 Insomnia, unspecified: Secondary | ICD-10-CM | POA: Diagnosis not present

## 2021-01-04 DIAGNOSIS — E249 Cushing's syndrome, unspecified: Secondary | ICD-10-CM | POA: Diagnosis present

## 2021-01-04 DIAGNOSIS — N3000 Acute cystitis without hematuria: Secondary | ICD-10-CM

## 2021-01-04 DIAGNOSIS — M6281 Muscle weakness (generalized): Secondary | ICD-10-CM | POA: Diagnosis not present

## 2021-01-04 DIAGNOSIS — M5021 Other cervical disc displacement,  high cervical region: Secondary | ICD-10-CM | POA: Diagnosis not present

## 2021-01-04 DIAGNOSIS — L304 Erythema intertrigo: Secondary | ICD-10-CM | POA: Diagnosis present

## 2021-01-04 DIAGNOSIS — G4737 Central sleep apnea in conditions classified elsewhere: Secondary | ICD-10-CM | POA: Diagnosis not present

## 2021-01-04 DIAGNOSIS — M5023 Other cervical disc displacement, cervicothoracic region: Secondary | ICD-10-CM | POA: Diagnosis not present

## 2021-01-04 DIAGNOSIS — N3289 Other specified disorders of bladder: Secondary | ICD-10-CM

## 2021-01-04 DIAGNOSIS — I5032 Chronic diastolic (congestive) heart failure: Secondary | ICD-10-CM | POA: Diagnosis present

## 2021-01-04 DIAGNOSIS — Z7901 Long term (current) use of anticoagulants: Secondary | ICD-10-CM | POA: Diagnosis not present

## 2021-01-04 DIAGNOSIS — I509 Heart failure, unspecified: Secondary | ICD-10-CM

## 2021-01-04 DIAGNOSIS — G8929 Other chronic pain: Secondary | ICD-10-CM | POA: Diagnosis not present

## 2021-01-04 DIAGNOSIS — Z7401 Bed confinement status: Secondary | ICD-10-CM | POA: Diagnosis not present

## 2021-01-04 DIAGNOSIS — A419 Sepsis, unspecified organism: Secondary | ICD-10-CM | POA: Diagnosis not present

## 2021-01-04 DIAGNOSIS — B952 Enterococcus as the cause of diseases classified elsewhere: Secondary | ICD-10-CM | POA: Diagnosis present

## 2021-01-04 DIAGNOSIS — T85615A Breakdown (mechanical) of other nervous system device, implant or graft, initial encounter: Secondary | ICD-10-CM | POA: Diagnosis present

## 2021-01-04 DIAGNOSIS — I480 Paroxysmal atrial fibrillation: Secondary | ICD-10-CM | POA: Diagnosis present

## 2021-01-04 DIAGNOSIS — E240 Pituitary-dependent Cushing's disease: Secondary | ICD-10-CM | POA: Diagnosis not present

## 2021-01-04 DIAGNOSIS — G35 Multiple sclerosis: Secondary | ICD-10-CM | POA: Diagnosis present

## 2021-01-04 DIAGNOSIS — K5900 Constipation, unspecified: Secondary | ICD-10-CM | POA: Diagnosis present

## 2021-01-04 DIAGNOSIS — I110 Hypertensive heart disease with heart failure: Secondary | ICD-10-CM | POA: Diagnosis present

## 2021-01-04 DIAGNOSIS — R252 Cramp and spasm: Secondary | ICD-10-CM | POA: Diagnosis not present

## 2021-01-04 DIAGNOSIS — E119 Type 2 diabetes mellitus without complications: Secondary | ICD-10-CM | POA: Diagnosis present

## 2021-01-04 DIAGNOSIS — I4891 Unspecified atrial fibrillation: Secondary | ICD-10-CM

## 2021-01-04 DIAGNOSIS — M5459 Other low back pain: Secondary | ICD-10-CM | POA: Diagnosis not present

## 2021-01-04 DIAGNOSIS — J189 Pneumonia, unspecified organism: Secondary | ICD-10-CM | POA: Diagnosis not present

## 2021-01-04 DIAGNOSIS — E785 Hyperlipidemia, unspecified: Secondary | ICD-10-CM | POA: Diagnosis present

## 2021-01-04 DIAGNOSIS — M5124 Other intervertebral disc displacement, thoracic region: Secondary | ICD-10-CM | POA: Diagnosis not present

## 2021-01-04 DIAGNOSIS — E559 Vitamin D deficiency, unspecified: Secondary | ICD-10-CM | POA: Diagnosis not present

## 2021-01-04 DIAGNOSIS — G4731 Primary central sleep apnea: Secondary | ICD-10-CM | POA: Diagnosis not present

## 2021-01-04 DIAGNOSIS — T83511A Infection and inflammatory reaction due to indwelling urethral catheter, initial encounter: Secondary | ICD-10-CM | POA: Diagnosis present

## 2021-01-04 DIAGNOSIS — E039 Hypothyroidism, unspecified: Secondary | ICD-10-CM | POA: Diagnosis present

## 2021-01-04 DIAGNOSIS — R1310 Dysphagia, unspecified: Secondary | ICD-10-CM | POA: Diagnosis not present

## 2021-01-04 DIAGNOSIS — R0600 Dyspnea, unspecified: Secondary | ICD-10-CM | POA: Diagnosis not present

## 2021-01-04 DIAGNOSIS — F32A Depression, unspecified: Secondary | ICD-10-CM | POA: Diagnosis present

## 2021-01-04 DIAGNOSIS — I428 Other cardiomyopathies: Secondary | ICD-10-CM | POA: Diagnosis present

## 2021-01-04 DIAGNOSIS — Z9989 Dependence on other enabling machines and devices: Secondary | ICD-10-CM | POA: Diagnosis not present

## 2021-01-04 DIAGNOSIS — E662 Morbid (severe) obesity with alveolar hypoventilation: Secondary | ICD-10-CM | POA: Diagnosis present

## 2021-01-04 DIAGNOSIS — M4805 Spinal stenosis, thoracolumbar region: Secondary | ICD-10-CM | POA: Diagnosis not present

## 2021-01-04 DIAGNOSIS — G5603 Carpal tunnel syndrome, bilateral upper limbs: Secondary | ICD-10-CM | POA: Diagnosis not present

## 2021-01-04 DIAGNOSIS — D1809 Hemangioma of other sites: Secondary | ICD-10-CM | POA: Diagnosis not present

## 2021-01-04 DIAGNOSIS — Z20822 Contact with and (suspected) exposure to covid-19: Secondary | ICD-10-CM | POA: Diagnosis present

## 2021-01-04 DIAGNOSIS — K219 Gastro-esophageal reflux disease without esophagitis: Secondary | ICD-10-CM | POA: Diagnosis present

## 2021-01-04 DIAGNOSIS — N390 Urinary tract infection, site not specified: Secondary | ICD-10-CM | POA: Diagnosis present

## 2021-01-04 DIAGNOSIS — R531 Weakness: Secondary | ICD-10-CM | POA: Diagnosis not present

## 2021-01-04 LAB — BASIC METABOLIC PANEL
Anion gap: 7 (ref 5–15)
BUN: 23 mg/dL (ref 8–23)
CO2: 33 mmol/L — ABNORMAL HIGH (ref 22–32)
Calcium: 9 mg/dL (ref 8.9–10.3)
Chloride: 100 mmol/L (ref 98–111)
Creatinine, Ser: 0.56 mg/dL (ref 0.44–1.00)
GFR, Estimated: >60 mL/min (ref >60)
Glucose, Bld: 80 mg/dL (ref 70–99)
Potassium: 3.5 mmol/L (ref 3.5–5.1)
Sodium: 140 mmol/L (ref 135–145)

## 2021-01-04 LAB — CBC
HCT: 38.8 % (ref 36.0–46.0)
Hemoglobin: 12.8 g/dL (ref 12.0–15.0)
MCH: 31.5 pg (ref 26.0–34.0)
MCHC: 33 g/dL (ref 30.0–36.0)
MCV: 95.6 fL (ref 80.0–100.0)
Platelets: 163 10*3/uL (ref 150–400)
RBC: 4.06 MIL/uL (ref 3.87–5.11)
RDW: 12.4 % (ref 11.5–15.5)
WBC: 8.8 10*3/uL (ref 4.0–10.5)
nRBC: 0 % (ref 0.0–0.2)

## 2021-01-04 LAB — SARS CORONAVIRUS 2 (TAT 6-24 HRS): SARS Coronavirus 2: NEGATIVE

## 2021-01-04 LAB — PROTIME-INR
INR: 2.6 — ABNORMAL HIGH (ref 0.8–1.2)
Prothrombin Time: 27.7 seconds — ABNORMAL HIGH (ref 11.4–15.2)

## 2021-01-04 MED ORDER — LORAZEPAM 2 MG/ML IJ SOLN
1.0000 mg | INTRAMUSCULAR | Status: DC | PRN
  Administered 2021-01-04: 1 mg via INTRAVENOUS
  Filled 2021-01-04: qty 1

## 2021-01-04 MED ORDER — WARFARIN - PHARMACIST DOSING INPATIENT: Freq: Every day | Status: DC

## 2021-01-04 MED ORDER — DILTIAZEM HCL 60 MG PO TABS
60.0000 mg | ORAL_TABLET | Freq: Three times a day (TID) | ORAL | Status: DC | PRN
  Filled 2021-01-04: qty 1

## 2021-01-04 MED ORDER — LACTULOSE 10 GM/15ML PO SOLN
10.0000 g | Freq: Every day | ORAL | Status: DC | PRN
  Administered 2021-01-04: 10 g via ORAL
  Filled 2021-01-04: qty 30

## 2021-01-04 MED ORDER — FUROSEMIDE 40 MG PO TABS
40.0000 mg | ORAL_TABLET | Freq: Two times a day (BID) | ORAL | Status: DC
  Administered 2021-01-04 – 2021-01-12 (×16): 40 mg via ORAL
  Administered 2021-01-13: 20 mg via ORAL
  Filled 2021-01-04 (×23): qty 1

## 2021-01-04 MED ORDER — WARFARIN SODIUM 5 MG PO TABS
5.0000 mg | ORAL_TABLET | Freq: Once | ORAL | Status: AC
  Administered 2021-01-04: 5 mg via ORAL
  Filled 2021-01-04: qty 1

## 2021-01-04 MED ORDER — PANTOPRAZOLE SODIUM 40 MG PO TBEC
40.0000 mg | DELAYED_RELEASE_TABLET | Freq: Every day | ORAL | Status: DC
  Administered 2021-01-04 – 2021-01-13 (×11): 40 mg via ORAL
  Filled 2021-01-04 (×10): qty 1

## 2021-01-04 MED ORDER — POTASSIUM CHLORIDE CRYS ER 20 MEQ PO TBCR
20.0000 meq | EXTENDED_RELEASE_TABLET | Freq: Two times a day (BID) | ORAL | Status: DC
  Administered 2021-01-04 – 2021-01-13 (×18): 20 meq via ORAL
  Filled 2021-01-04 (×19): qty 1

## 2021-01-04 MED ORDER — ADULT MULTIVITAMIN W/MINERALS CH
1.0000 | ORAL_TABLET | Freq: Every day | ORAL | Status: DC
  Administered 2021-01-04 – 2021-01-13 (×10): 1 via ORAL
  Filled 2021-01-04 (×11): qty 1

## 2021-01-04 NOTE — Plan of Care (Signed)
  Problem: Health Behavior/Discharge Planning: Goal: Ability to manage health-related needs will improve Outcome: Progressing   Problem: Clinical Measurements: Goal: Ability to maintain clinical measurements within normal limits will improve Outcome: Progressing Goal: Will remain free from infection Outcome: Progressing   

## 2021-01-04 NOTE — Progress Notes (Signed)
ANTICOAGULATION CONSULT NOTE - Initial Consult  Pharmacy Consult for warfarin Indication: atrial fibrillation  Allergies  Allergen Reactions  . Other Rash    Topical Surgical prep  - severe rash  . Chlorhexidine Hives and Other (See Comments)    And, sores appear where applied  . Hydrocodone Nausea And Vomiting and Other (See Comments)    Hydrocodone causes vomiting Hydrocodone causes vomiting  . Oxycodone Nausea And Vomiting and Other (See Comments)    Oral oxycodone causes vomiting  . Sertraline Hives, Swelling, Rash and Other (See Comments)    [DERM][OTHER]RASH WITH SWELLING    Patient Measurements: Height: 5\' 2"  (157.5 cm) Weight: 83 kg (182 lb 15.7 oz) IBW/kg (Calculated) : 50.1   Vital Signs: Temp: 97.7 F (36.5 C) (05/17 0602) Temp Source: Oral (05/17 0602) BP: 110/50 (05/17 0816) Pulse Rate: 62 (05/17 0602)  Labs: Recent Labs    01/03/21 1317 01/03/21 1600 01/04/21 0351  HGB  --  14.3 12.8  HCT  --  43.8 38.8  PLT  --  185 163  LABPROT  --  30.0* 27.7*  INR 3.0 2.9* 2.6*  CREATININE  --  0.59 0.56    Estimated Creatinine Clearance: 62.6 mL/min (by C-G formula based on SCr of 0.56 mg/dL).   Medical History: Past Medical History:  Diagnosis Date  . Abnormality of gait 03/01/2015  . Anxiety   . Arthritis    "knees" (01/19/2016)  . Asthma    as a child   . Back pain   . Bilateral carpal tunnel syndrome 10/04/2020  . Cancer ([place])    / RENAL CELL CARCINOMA - GETS CT SCAN EVERY 6 MONTHS TO WATCH   . CHF (congestive heart failure) ([place])   . Chronic pain    "nerve pain from the MS"  . Complex atypical endometrial hyperplasia   . Constipation   . Depression   . Diabetes mellitus without complication ([place])   . DVT (deep venous thrombosis) ([place]) 1983   "RLE; may have just been phlebitis; it was before the age of dopplers"  . Dysrhythmia    afib   . 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 GERD  (gastroesophageal reflux disease)   . HLD (hyperlipidemia)   . Hypertension   . Hypotension   . Hypothyroidism   . Joint pain   . Multiple sclerosis ([place])    has had this may 1989-Dohmeir reg doc  . OSA on CPAP    bipap machine - SLEEP APNEA WORSE SINCE COVID IN 1/21 PER PATIENT SETTING HAS INCREASED FROM 9 TO 30   . Osteoarthritis   . PAF (paroxysmal atrial fibrillation) ([place])    on coumadin; documented on monitor 06/2010  . Pneumonia 11/2011  . Sleep apnea      Assessment: 75 yo female presents with several days of foul smelling urine and increased weakness.  Pharmacy consulted to dose warfarin for AFib.  HD 2.5mg  on Mondays and Thursdays, 5mg  all other days.   INR therapeutic  CBC ok  No reported bleeding  Goal of Therapy:  INR 2-3    Plan:   Continue with home warfarin dose as above - 5mg  today  Daily INR  Adrian Saran, PharmD, BCPS Secure Chat if ?s 01/04/2021 2:29 PM

## 2021-01-04 NOTE — Progress Notes (Signed)
Progress Note    RONNE SAVOIA   YQM:[id number]  DOB: 02/13/1946  DOA: 01/03/2021     0  PCP: Prince Solian, MD  CC: muscle spasms, bladder spasms  Hospital Course: Toni [name] is a 75 y.o. female with history of multiple sclerosis largely bedbound, A. 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 She was recently seen by her neurologist and her baclofen pump was adjusted for a higher dose and also refilled (11/30/20).  She has a history of recurrent UTIs and follows with urology and is being evaluated currently for possible suprapubic catheter placement. She has been on multiple antibiotic courses and most recently states that she was started on chronic suppressive antibiotic therapy (she thinks Keflex). She was admitted for her bladder spasms and concern for recurrent UTI.  She was started on Rocephin on admission.  IV Ativan has worked for her ongoing bladder spasms.  Her case was also discussed with neuro on admission (see H&P; per Dr. Leonie Man, if no improvement in spasms after UTI treatment then neuro eval can be pursued).   Interval History:  No events overnight.  Seen bedside with husband present this morning.  She was having ongoing spasms pain and states that the Ativan does work.  She states that she is mostly bedbound but can transfer out of bed with a lift or a slide.  ROS: Constitutional: negative for chills and fevers, Respiratory: negative for cough, Cardiovascular: negative for chest pain and Gastrointestinal: negative for abdominal pain  Assessment & Plan:  Bladder spasms - etiology possibly 2/2 UTI vs MS related? Vs baclofen pump malfunction? - plan for now is to treat UTI 2-3 days; use PRN ativan for now; if no improvement then neuro consult to further assist; not sure if needs urology input (not  yet), but possibly the indwelling foley which is new as of ~3 weeks ago may be causing irritation as well (this was exchanged on admission also)  UTI - continue rocephin - follow up culture  MS - home med on hold - continue xanax and ativan (PRN; the xanax is a home med) - continue PRN baclofen, gaba  Constipation - continue current regimen  Afib - continue diltiazem for now; hold parameters entered - continue coumadin per pharmacy  Central sleep apnea - continue BiPAP  Depression - Continue Lexapro and Wellbutrin  Old records reviewed in assessment of this patient  Antimicrobials: Rocephin 5/16 >> current  DVT prophylaxis: Coumadin    Code Status:   Code Status: Full Code Family Communication: husband  Disposition Plan: Status is: Observation  The patient will require care spanning > 2 midnights and should be moved to inpatient because: IV treatments appropriate due to intensity of illness or inability to take PO and Inpatient level of care appropriate due to severity of illness  Dispo: The patient is from: Home              Anticipated d/c is to: Home              Patient currently is not medically stable to d/c.   Difficult to place patient No  Risk of unplanned readmission score:     Objective: Blood pressure (!) 110/50, pulse 62, temperature 97.7 F (36.5 C), temperature source Oral, resp. rate 14, height 5\' 2"  (1.575 m), weight 83 kg, SpO2 100 %.  Examination: General appearance:  alert, cooperative and no distress Head: Normocephalic, without obvious abnormality, atraumatic Eyes: EOMI Lungs: clear to auscultation bilaterally Heart: regular rate and rhythm and S1, S2 normal Abdomen: normal findings: bowel sounds normal and soft, non-tender Extremities: obese; LE edema noted Skin: mobility and turgor normal Neurologic: decreased overall strength but RLE more focally weak  Consultants:     Procedures:     Data Reviewed: I have personally  reviewed following labs and imaging studies Results for orders placed or performed during the hospital encounter of 01/03/21 (from the past 24 hour(s))  Culture, blood (Routine x 2)     Status: None (Preliminary result)   Collection Time: 01/03/21  3:21 PM   Specimen: Right Antecubital; Blood  Result Value Ref Range   Specimen Description      RIGHT ANTECUBITAL BLOOD Performed at [facility], [address] [place], [place] [address]    Special Requests      BOTTLES DRAWN AEROBIC AND ANAEROBIC Blood Culture adequate volume Performed at [facility] [address] [place], [place] [address]    Culture      NO GROWTH < 12 HOURS Performed at [facility] [address] [place], [place] [address]    Report Status PENDING   Culture, blood (Routine x 2)     Status: None (Preliminary result)   Collection Time: 01/03/21  3:26 PM   Specimen: BLOOD RIGHT HAND  Result Value Ref Range   Specimen Description      BLOOD RIGHT HAND Performed at [facility] [address] [place], [place] [address]    Special Requests      BOTTLES DRAWN AEROBIC AND ANAEROBIC Blood Culture adequate volume Performed at [facility] [address] [place], [place] [address]    Culture      NO GROWTH < 12 HOURS Performed at [facility] [address] [place], [place] [address]    Report Status PENDING   Comprehensive metabolic panel     Status: Abnormal   Collection Time: 01/03/21  4:00 PM  Result Value Ref Range   Sodium 140 135 - 145 mmol/L   Potassium 3.6 3.5 - 5.1 mmol/L   Chloride 98 98 - 111 mmol/L   CO2 33 (H) 22 - 32 mmol/L   Glucose, Bld 101 (H) 70 - 99 mg/dL   BUN 25 (H) 8 - 23 mg/dL   Creatinine, Ser 0.59 0.44 - 1.00 mg/dL   Calcium 9.1 8.9 - 10.3 mg/dL   Total Protein 6.7 6.5 - 8.1 g/dL   Albumin 3.7 3.5 - 5.0 g/dL   AST 19 15 - 41 U/L   ALT 15 0 - 44 U/L   Alkaline Phosphatase 57 38 - 126 U/L   Total  Bilirubin 0.2 (L) 0.3 - 1.2 mg/dL   GFR, Estimated >60 >60 mL/min   Anion gap 9 5 - 15  Lactic acid, plasma     Status: None   Collection Time: 01/03/21  4:00 PM  Result Value Ref Range   Lactic Acid, Venous 1.0 0.5 - 1.9 mmol/L  CBC with Differential     Status: Abnormal   Collection Time: 01/03/21  4:00 PM  Result Value Ref Range   WBC 8.9 4.0 - 10.5 K/uL   RBC 4.61 3.87 - 5.11 MIL/uL   Hemoglobin 14.3 12.0 - 15.0 g/dL   HCT 43.8 36.0 - 46.0 %   MCV 95.0 80.0 - 100.0 fL  MCH 31.0 26.0 - 34.0 pg   MCHC 32.6 30.0 - 36.0 g/dL   RDW 12.5 11.5 - 15.5 %   Platelets 185 150 - 400 K/uL   nRBC 0.0 0.0 - 0.2 %   Neutrophils Relative % 58 %   Neutro Abs 5.2 1.7 - 7.7 K/uL   Lymphocytes Relative 26 %   Lymphs Abs 2.3 0.7 - 4.0 K/uL   Monocytes Relative 13 %   Monocytes Absolute 1.1 (H) 0.1 - 1.0 K/uL   Eosinophils Relative 2 %   Eosinophils Absolute 0.2 0.0 - 0.5 K/uL   Basophils Relative 1 %   Basophils Absolute 0.1 0.0 - 0.1 K/uL   Immature Granulocytes 0 %   Abs Immature Granulocytes 0.03 0.00 - 0.07 K/uL  Protime-INR     Status: Abnormal   Collection Time: 01/03/21  4:00 PM  Result Value Ref Range   Prothrombin Time 30.0 (H) 11.4 - 15.2 seconds   INR 2.9 (H) 0.8 - 1.2  Urinalysis, Routine w reflex microscopic     Status: Abnormal   Collection Time: 01/03/21  4:00 PM  Result Value Ref Range   Color, Urine YELLOW YELLOW   APPearance CLEAR CLEAR   Specific Gravity, Urine 1.009 1.005 - 1.030   pH 6.0 5.0 - 8.0   Glucose, UA NEGATIVE NEGATIVE mg/dL   Hgb urine dipstick SMALL (A) NEGATIVE   Bilirubin Urine NEGATIVE NEGATIVE   Ketones, ur NEGATIVE NEGATIVE mg/dL   Protein, ur NEGATIVE NEGATIVE mg/dL   Nitrite POSITIVE (A) NEGATIVE   Leukocytes,Ua LARGE (A) NEGATIVE   RBC / HPF 6-10 0 - 5 RBC/hpf   WBC, UA >50 (H) 0 - 5 WBC/hpf   Bacteria, UA RARE (A) NONE SEEN   Mucus PRESENT   TSH     Status: None   Collection Time: 01/03/21  4:00 PM  Result Value Ref Range   TSH 2.961  0.350 - 4.500 uIU/mL  Lactic acid, plasma     Status: None   Collection Time: 01/03/21  6:14 PM  Result Value Ref Range   Lactic Acid, Venous 1.1 0.5 - 1.9 mmol/L  SARS CORONAVIRUS 2 (TAT 6-24 HRS) Nasopharyngeal Nasopharyngeal Swab     Status: None   Collection Time: 01/03/21  8:57 PM   Specimen: Nasopharyngeal Swab  Result Value Ref Range   SARS Coronavirus 2 NEGATIVE NEGATIVE  Basic metabolic panel     Status: Abnormal   Collection Time: 01/04/21  3:51 AM  Result Value Ref Range   Sodium 140 135 - 145 mmol/L   Potassium 3.5 3.5 - 5.1 mmol/L   Chloride 100 98 - 111 mmol/L   CO2 33 (H) 22 - 32 mmol/L   Glucose, Bld 80 70 - 99 mg/dL   BUN 23 8 - 23 mg/dL   Creatinine, Ser 0.56 0.44 - 1.00 mg/dL   Calcium 9.0 8.9 - 10.3 mg/dL   GFR, Estimated >60 >60 mL/min   Anion gap 7 5 - 15  CBC     Status: None   Collection Time: 01/04/21  3:51 AM  Result Value Ref Range   WBC 8.8 4.0 - 10.5 K/uL   RBC 4.06 3.87 - 5.11 MIL/uL   Hemoglobin 12.8 12.0 - 15.0 g/dL   HCT 38.8 36.0 - 46.0 %   MCV 95.6 80.0 - 100.0 fL   MCH 31.5 26.0 - 34.0 pg   MCHC 33.0 30.0 - 36.0 g/dL   RDW 12.4 11.5 - 15.5 %   Platelets 163  150 - 400 K/uL   nRBC 0.0 0.0 - 0.2 %  Protime-INR     Status: Abnormal   Collection Time: 01/04/21  3:51 AM  Result Value Ref Range   Prothrombin Time 27.7 (H) 11.4 - 15.2 seconds   INR 2.6 (H) 0.8 - 1.2   *Note: Due to a large number of results and/or encounters for the requested time period, some results have not been displayed. A complete set of results can be found in Results Review.    Recent Results (from the past 240 hour(s))  Culture, blood (Routine x 2)     Status: None (Preliminary result)   Collection Time: 01/03/21  3:21 PM   Specimen: Right Antecubital; Blood  Result Value Ref Range Status   Specimen Description   Final    RIGHT ANTECUBITAL BLOOD Performed at [facility], [address], [place], [place] [address]    Special Requests   Final    BOTTLES  DRAWN AEROBIC AND ANAEROBIC Blood Culture adequate volume Performed at [facility] [address], [place], [place] [address]    Culture   Final    NO GROWTH < 12 HOURS Performed at [facility] [address], [place], [place] [address]    Report Status PENDING  Incomplete  Culture, blood (Routine x 2)     Status: None (Preliminary result)   Collection Time: 01/03/21  3:26 PM   Specimen: BLOOD RIGHT HAND  Result Value Ref Range Status   Specimen Description   Final    BLOOD RIGHT HAND Performed at [facility] [address], [place], [place] [address]    Special Requests   Final    BOTTLES DRAWN AEROBIC AND ANAEROBIC Blood Culture adequate volume Performed at [facility] [address] [place], [place] [address]    Culture   Final    NO GROWTH < 12 HOURS Performed at [facility] [address], [place], [place] [address]    Report Status PENDING  Incomplete  SARS CORONAVIRUS 2 (TAT 6-24 HRS) Nasopharyngeal Nasopharyngeal Swab     Status: None   Collection Time: 01/03/21  8:57 PM   Specimen: Nasopharyngeal Swab  Result Value Ref Range Status   SARS Coronavirus 2 NEGATIVE NEGATIVE Final    Comment: (NOTE) SARS-CoV-2 target nucleic acids are NOT DETECTED.  The SARS-CoV-2 RNA is generally detectable in upper and lower respiratory specimens during the acute phase of infection. Negative results do not preclude SARS-CoV-2 infection, do not rule out co-infections with other pathogens, and should not be used as the sole basis for treatment or other patient management decisions. Negative results must be combined with clinical observations, patient history, and epidemiological information. The expected result is Negative.  Fact Sheet for Patients: SugarRoll.be  Fact Sheet for Healthcare Providers: https://www.woods-mathews.com/  This test is not yet approved or  cleared by the [place] FDA and  has been authorized for detection and/or diagnosis of SARS-CoV-2 by FDA under an Emergency Use Authorization (EUA). This EUA will remain  in effect (meaning this test can be used) for the duration of the COVID-19 declaration under Se ction 564(b)(1) of the Act, 21 U.S.C. section 360bbb-3(b)(1), unless the authorization is terminated or revoked sooner.  Performed at [facility], [place] [address], [place], [place] [address]      Radiology Studies: DG Chest 1 View  Result Date: 01/03/2021 CLINICAL DATA:  UTI EXAM: CHEST  1  VIEW COMPARISON:  11/04/2018 FINDINGS: Hardware in the cervical spine. No focal opacity or pleural effusion. Stable cardiomediastinal silhouette. No pneumothorax. IMPRESSION: No active disease. Electronically Signed   By: Donavan Foil M.D.   On: 01/03/2021 16:24   DG Chest 1 View  Final Result      Scheduled Meds: . buPROPion  150 mg Oral Daily  . cholecalciferol  2,000 Units Oral Daily  . diltiazem  240 mg Oral Daily  . escitalopram  10 mg Oral QHS  . famotidine  20 mg Oral QHS  . gabapentin  300 mg Oral Q6H  . levothyroxine  75 mcg Oral Q0600  . linaclotide  145 mcg Oral QAC breakfast  . melatonin  5 mg Oral QHS  . multivitamin with minerals  1 tablet Oral Daily  . omega-3 acid ethyl esters  1 g Oral Daily  . simvastatin  10 mg Oral QPM  . tamsulosin  0.4 mg Oral q AM   PRN Meds: acetaminophen **OR** acetaminophen, ALPRAZolam, baclofen, diltiazem, docusate sodium, lactulose (encephalopathy), LORazepam, oxybutynin Continuous Infusions: . cefTRIAXone (ROCEPHIN)  IV Stopped (01/03/21 2029)     LOS: 0 days  Time spent: Greater than 50% of the 35 minute visit was spent in counseling/coordination of care for the patient as laid out in the A&P.   Dwyane Dee, MD [facility] 01/04/2021, 12:29 PM

## 2021-01-04 NOTE — Progress Notes (Signed)
ANTICOAGULATION CONSULT NOTE - Initial Consult  Pharmacy Consult for warfarin Indication: atrial fibrillation  Allergies  Allergen Reactions  . Other Rash    Topical Surgical prep  - severe rash  . Chlorhexidine Hives and Other (See Comments)    And, sores appear where applied  . Hydrocodone Nausea And Vomiting and Other (See Comments)    Hydrocodone causes vomiting Hydrocodone causes vomiting  . Oxycodone Nausea And Vomiting and Other (See Comments)    Oral oxycodone causes vomiting  . Sertraline Hives, Swelling, Rash and Other (See Comments)    [DERM][OTHER]RASH WITH SWELLING    Patient Measurements: Height: 5\' 2"  (157.5 cm) Weight: 83 kg (182 lb 15.7 oz) IBW/kg (Calculated) : 50.1   Vital Signs: Temp: 99.2 F (37.3 C) (05/16 2130) Temp Source: Oral (05/16 2130) BP: 125/74 (05/16 2130) Pulse Rate: 89 (05/16 2130)  Labs: Recent Labs    01/03/21 1317 01/03/21 1600  HGB  --  14.3  HCT  --  43.8  PLT  --  185  LABPROT  --  30.0*  INR 3.0 2.9*  CREATININE  --  0.59    Estimated Creatinine Clearance: 62.6 mL/min (by C-G formula based on SCr of 0.59 mg/dL).   Medical History: Past Medical History:  Diagnosis Date  . Abnormality of gait 03/01/2015  . Anxiety   . Arthritis    "knees" (01/19/2016)  . Asthma    as a child   . Back pain   . Bilateral carpal tunnel syndrome 10/04/2020  . Cancer ([place])    / RENAL CELL CARCINOMA - GETS CT SCAN EVERY 6 MONTHS TO WATCH   . CHF (congestive heart failure) ([place])   . Chronic pain    "nerve pain from the MS"  . Complex atypical endometrial hyperplasia   . Constipation   . Depression   . Diabetes mellitus without complication ([place])   . DVT (deep venous thrombosis) ([facility]) 1983   "RLE; may have just been phlebitis; it was before the age of dopplers"  . Dysrhythmia    afib   . 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 GERD (gastroesophageal reflux disease)   . HLD  (hyperlipidemia)   . Hypertension   . Hypotension   . Hypothyroidism   . Joint pain   . Multiple sclerosis ([place])    has had this may 1989-[place] reg doc  . OSA on CPAP    bipap machine - SLEEP APNEA WORSE SINCE COVID IN 1/21 PER PATIENT SETTING HAS INCREASED FROM 9 TO 30   . Osteoarthritis   . PAF (paroxysmal atrial fibrillation) ([facility])    on coumadin; documented on monitor 06/2010  . Pneumonia 11/2011  . Sleep apnea      Assessment: 75 yo female presents with several days of foul smelling urine and increased weakness.  Pharmacy consulted to dose warfarin for AFib.  HD 2.5mg  on Mondays and Thursdays, 5mg  all other days.  5/16 INR 2.9 CBC WNL  Goal of Therapy:  INR 2-3    Plan:  Pt had warfarin dose today (5/16) Daily INR  Dolly Rias RPh 01/04/2021, 12:42 AM

## 2021-01-05 DIAGNOSIS — A419 Sepsis, unspecified organism: Secondary | ICD-10-CM | POA: Diagnosis not present

## 2021-01-05 DIAGNOSIS — N390 Urinary tract infection, site not specified: Secondary | ICD-10-CM | POA: Diagnosis not present

## 2021-01-05 DIAGNOSIS — N3000 Acute cystitis without hematuria: Secondary | ICD-10-CM | POA: Diagnosis not present

## 2021-01-05 LAB — CBC WITH DIFFERENTIAL/PLATELET
Abs Immature Granulocytes: 0.02 10*3/uL (ref 0.00–0.07)
Basophils Absolute: 0.1 10*3/uL (ref 0.0–0.1)
Basophils Relative: 1 %
Eosinophils Absolute: 0.3 10*3/uL (ref 0.0–0.5)
Eosinophils Relative: 4 %
HCT: 37.3 % (ref 36.0–46.0)
Hemoglobin: 12.5 g/dL (ref 12.0–15.0)
Immature Granulocytes: 0 %
Lymphocytes Relative: 42 %
Lymphs Abs: 3 10*3/uL (ref 0.7–4.0)
MCH: 31.6 pg (ref 26.0–34.0)
MCHC: 33.5 g/dL (ref 30.0–36.0)
MCV: 94.2 fL (ref 80.0–100.0)
Monocytes Absolute: 1 10*3/uL (ref 0.1–1.0)
Monocytes Relative: 14 %
Neutro Abs: 2.9 10*3/uL (ref 1.7–7.7)
Neutrophils Relative %: 39 %
Platelets: 163 10*3/uL (ref 150–400)
RBC: 3.96 MIL/uL (ref 3.87–5.11)
RDW: 12.5 % (ref 11.5–15.5)
WBC: 7.2 10*3/uL (ref 4.0–10.5)
nRBC: 0 % (ref 0.0–0.2)

## 2021-01-05 LAB — BASIC METABOLIC PANEL
Anion gap: 7 (ref 5–15)
BUN: 21 mg/dL (ref 8–23)
CO2: 33 mmol/L — ABNORMAL HIGH (ref 22–32)
Calcium: 8.8 mg/dL — ABNORMAL LOW (ref 8.9–10.3)
Chloride: 99 mmol/L (ref 98–111)
Creatinine, Ser: 0.48 mg/dL (ref 0.44–1.00)
GFR, Estimated: >60 mL/min (ref >60)
Glucose, Bld: 93 mg/dL (ref 70–99)
Potassium: 3.8 mmol/L (ref 3.5–5.1)
Sodium: 139 mmol/L (ref 135–145)

## 2021-01-05 LAB — PROTIME-INR
INR: 2.2 — ABNORMAL HIGH (ref 0.8–1.2)
Prothrombin Time: 24.3 seconds — ABNORMAL HIGH (ref 11.4–15.2)

## 2021-01-05 LAB — MAGNESIUM: Magnesium: 2 mg/dL (ref 1.7–2.4)

## 2021-01-05 MED ORDER — BACID PO TABS
2.0000 | ORAL_TABLET | Freq: Three times a day (TID) | ORAL | Status: DC
  Filled 2021-01-05: qty 2

## 2021-01-05 MED ORDER — POLYETHYLENE GLYCOL 3350 17 G PO PACK
17.0000 g | PACK | Freq: Two times a day (BID) | ORAL | Status: DC
  Administered 2021-01-05 – 2021-01-12 (×12): 17 g via ORAL
  Filled 2021-01-05 (×16): qty 1

## 2021-01-05 MED ORDER — SACCHAROMYCES BOULARDII 250 MG PO CAPS
250.0000 mg | ORAL_CAPSULE | Freq: Two times a day (BID) | ORAL | Status: DC
  Administered 2021-01-05 – 2021-01-11 (×13): 250 mg via ORAL
  Filled 2021-01-05 (×17): qty 1

## 2021-01-05 MED ORDER — SODIUM CHLORIDE 0.9 % IV SOLN
1.0000 g | Freq: Four times a day (QID) | INTRAVENOUS | Status: DC
  Administered 2021-01-05 – 2021-01-06 (×4): 1 g via INTRAVENOUS
  Filled 2021-01-05 (×4): qty 1
  Filled 2021-01-05: qty 1000

## 2021-01-05 MED ORDER — LACTULOSE 10 GM/15ML PO SOLN
10.0000 g | Freq: Two times a day (BID) | ORAL | Status: DC | PRN
  Administered 2021-01-06: 10 g via ORAL
  Filled 2021-01-05 (×2): qty 30

## 2021-01-05 MED ORDER — ONDANSETRON 4 MG PO TBDP: 4.0000 mg | ORAL_TABLET | Freq: Three times a day (TID) | ORAL | Status: DC | PRN

## 2021-01-05 MED ORDER — WARFARIN SODIUM 5 MG PO TABS
5.0000 mg | ORAL_TABLET | Freq: Once | ORAL | Status: AC
  Administered 2021-01-05: 5 mg via ORAL
  Filled 2021-01-05: qty 1

## 2021-01-05 MED ORDER — ONDANSETRON HCL 4 MG PO TABS
8.0000 mg | ORAL_TABLET | Freq: Two times a day (BID) | ORAL | Status: DC | PRN
  Administered 2021-01-05 – 2021-01-11 (×6): 8 mg via ORAL
  Filled 2021-01-05 (×6): qty 2

## 2021-01-05 NOTE — Progress Notes (Signed)
PROGRESS NOTE    Toni [name]  ACZ:[id number] DOB: 1945-10-21 DOA: 01/03/2021 PCP: Prince Solian, MD   Chief Complaint  Patient presents with  . foul urine  . rectal discharge    Brief Narrative:  Toni [name] Toni [name] 75 y.o.femalewithhistory of multiple sclerosis largely bedbound, Toni [name]. 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  She was recently seen by her neurologist and her baclofen pump was adjusted for Toni [name] higher dose and also refilled (11/30/20).  She has Toni [name] history of recurrent UTIs and follows with urology and is being evaluated currently for possible suprapubic catheter placement. She has been on multiple antibiotic courses and most recently states that she was started on chronic suppressive antibiotic therapy (she thinks Keflex).  She was admitted for her bladder spasms and concern for recurrent UTI.  She was started on Rocephin on admission.  IV Ativan has worked for her ongoing bladder spasms.  Her case was also discussed with neuro on admission (see H&P; per Dr. Leonie [name], if no improvement in spasms after UTI treatment then neuro eval can be pursued).   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 Vs baclofen pump malfunction? - plan for now is to treat UTI 2-3 days; use PRN ativan for now; if no improvement then neuro consult to  further assist; not sure if needs urology input (not yet), but possibly the indwelling foley which is new as of ~3 weeks ago may be causing irritation as well (this was exchanged on admission also)  Enterococcus Faecalis UTI  Indwelling Foley Catheter - continue rocephin -> transition to ampicillin per antibiogram - follow up culture  MS - home med on hold - continue xanax and ativan (PRN; the xanax is Toni [name] home med) - continue PRN baclofen, gaba  Constipation - continue current regimen  Afib - continue diltiazem for now; hold parameters entered - continue coumadin per pharmacy  Central sleep apnea - continue BiPAP  Depression - Continue Lexapro and Wellbutrin  DVT prophylaxis: warfarin Code Status: full  Family Communication: none at bedside Disposition:   Status is: Inpatient  Remains inpatient appropriate because:Inpatient level of care appropriate due to severity of illness   Dispo: The patient is from: Home              Anticipated d/c is to: Home              Patient currently is not medically stable to d/c.   Difficult to place patient No       Consultants:   none  Procedures:  none  Antimicrobials: Anti-infectives (From admission, onward)   Start     Dose/Rate Route Frequency Ordered Stop   01/05/21 1215  ampicillin (OMNIPEN) 1 g in sodium chloride 0.9 % 100 mL IVPB        1 g 300 mL/hr over 20 Minutes Intravenous Every 6 hours 01/05/21 1117  01/03/21 1845  cefTRIAXone (ROCEPHIN) 1 g in sodium chloride 0.9 % 100 mL IVPB  Status:  Discontinued        1 g 200 mL/hr over 30 Minutes Intravenous Every 24 hours 01/03/21 1837 01/05/21 1117         Subjective: No new complaints Still c/o bladder spasms  Objective: Vitals:   01/04/21 2017 01/05/21 0520 01/05/21 0520 01/05/21 0953  BP: 123/71 (!) 107/52  (!) 102/51  Pulse: 77 77 90   Resp: 16 16    Temp: 99.1 F (37.3 C) 98.2 F (36.8 C)    TempSrc: Oral Oral    SpO2: 97% 99% 99%    Weight:      Height:        Intake/Output Summary (Last 24 hours) at 01/05/2021 1444 Last data filed at 01/05/2021 0500 Gross per 24 hour  Intake --  Output 2950 ml  Net -2950 ml   Filed Weights   01/03/21 1614  Weight: 83 kg    Examination:  General exam: Appears calm and comfortable  Respiratory system: Clear to auscultation. Respiratory effort normal. Cardiovascular system: S1 & S2 heard, RRR Gastrointestinal system: Abdomen is nondistended, soft and nontender. Central nervous system: Alert and oriented. Moving all extremities Extremities: no LEE Skin: No rashes, lesions or ulcers Psychiatry: Judgement and insight appear normal. Mood & affect appropriate.     Data Reviewed: I have personally reviewed following labs and imaging studies  CBC: Recent Labs  Lab 01/03/21 1600 01/04/21 0351 01/05/21 0329  WBC 8.9 8.8 7.2  NEUTROABS 5.2  --  2.9  HGB 14.3 12.8 12.5  HCT 43.8 38.8 37.3  MCV 95.0 95.6 94.2  PLT 185 163 [id number]    Basic Metabolic Panel: Recent Labs  Lab 01/03/21 1600 01/04/21 0351 01/05/21 0329  NA 140 140 139  K 3.6 3.5 3.8  CL 98 100 99  CO2 33* 33* 33*  GLUCOSE 101* 80 93  BUN 25* 23 21  CREATININE 0.59 0.56 0.48  CALCIUM 9.1 9.0 8.8*  MG  --   --  2.0    GFR: Estimated Creatinine Clearance: 62.6 mL/min (by C-G formula based on SCr of 0.48 mg/dL).  Liver Function Tests: Recent Labs  Lab 01/03/21 1600  AST 19  ALT 15  ALKPHOS 57  BILITOT 0.2*  PROT 6.7  ALBUMIN 3.7    CBG: No results for input(s): GLUCAP in the last 168 hours.   Recent Results (from the past 240 hour(s))  Culture, blood (Routine x 2)     Status: None (Preliminary result)   Collection Time: 01/03/21  3:21 PM   Specimen: Right Antecubital; Blood  Result Value Ref Range Status   Specimen Description   Final    RIGHT ANTECUBITAL BLOOD Performed at [facility], [address], [place], [place] [address]    Special Requests   Final    BOTTLES DRAWN  AEROBIC AND ANAEROBIC Blood Culture adequate volume Performed at [facility] [address] [place], [place] [address]    Culture   Final    NO GROWTH 2 DAYS Performed at [facility] [address], [place], [place] [address]    Report Status PENDING  Incomplete  Culture, blood (Routine x 2)     Status: None (Preliminary result)   Collection Time: 01/03/21  3:26 PM   Specimen: BLOOD RIGHT HAND  Result Value Ref Range Status   Specimen Description   Final  BLOOD RIGHT HAND Performed at [facility], [place] [address] [place], [place] [address]    Special Requests   Final    BOTTLES DRAWN AEROBIC AND ANAEROBIC Blood Culture adequate volume Performed at [facility] [address] [place], [place] [address]    Culture   Final    NO GROWTH 2 DAYS Performed at [facility] [address] [place], [place] [address]    Report Status PENDING  Incomplete  Culture, Urine     Status: Abnormal (Preliminary result)   Collection Time: 01/03/21  4:00 PM   Specimen: Urine, Random  Result Value Ref Range Status   Specimen Description   Final    URINE, RANDOM Performed at [facility] [address] [place], [place] [address]    Special Requests   Final    NONE Performed at [facility], [place] [address], [place], [place] [address]    Culture (Sevastian Witczak)  Final    >=100,000 COLONIES/mL ENTEROCOCCUS FAECALIS SUSCEPTIBILITIES TO FOLLOW Performed at [facility], [place] [address], [place], [place] [address]    Report Status PENDING  Incomplete  SARS CORONAVIRUS 2 (TAT 6-24 HRS) Nasopharyngeal Nasopharyngeal Swab     Status: None   Collection Time: 01/03/21  8:57 PM   Specimen: Nasopharyngeal Swab  Result Value Ref Range Status   SARS Coronavirus 2 NEGATIVE NEGATIVE Final    Comment: (NOTE) SARS-CoV-2 target nucleic acids are NOT DETECTED.  The SARS-CoV-2 RNA is generally  detectable in upper and lower respiratory specimens during the acute phase of infection. Negative results do not preclude SARS-CoV-2 infection, do not rule out co-infections with other pathogens, and should not be used as the sole basis for treatment or other patient management decisions. Negative results must be combined with clinical observations, patient history, and epidemiological information. The expected result is Negative.  Fact Sheet for Patients: SugarRoll.be  Fact Sheet for Healthcare Providers: https://www.woods-mathews.com/  This test is not yet approved or cleared by the [place] FDA and  has been authorized for detection and/or diagnosis of SARS-CoV-2 by FDA under an Emergency Use Authorization (EUA). This EUA will remain  in effect (meaning this test can be used) for the duration of the COVID-19 declaration under Se ction 564(b)(1) of the Act, 21 U.S.C. section 360bbb-3(b)(1), unless the authorization is terminated or revoked sooner.  Performed at [facility], [place] [address], [place], [place] [address]          Radiology Studies: DG Chest 1 View  Result Date: 01/03/2021 CLINICAL DATA:  UTI EXAM: CHEST  1 VIEW COMPARISON:  11/04/2018 FINDINGS: Hardware in the cervical spine. No focal opacity or pleural effusion. Stable cardiomediastinal silhouette. No pneumothorax. IMPRESSION: No active disease. Electronically Signed   By: Donavan Foil M.D.   On: 01/03/2021 16:24        Scheduled Meds: . buPROPion  150 mg Oral Daily  . cholecalciferol  2,000 Units Oral Daily  . diltiazem  240 mg Oral Daily  . escitalopram  10 mg Oral QHS  . famotidine  20 mg Oral QHS  . furosemide  40 mg Oral BID  . gabapentin  300 mg Oral Q6H  . levothyroxine  75 mcg Oral Q0600  . linaclotide  145 mcg Oral QAC breakfast  . melatonin  5 mg Oral QHS  . multivitamin with minerals  1 tablet Oral Daily  . omega-3 acid ethyl esters  1  g  Oral Daily  . pantoprazole  40 mg Oral Daily  . polyethylene glycol  17 g Oral BID  . potassium chloride SA  20 mEq Oral BID  . simvastatin  10 mg Oral QPM  . tamsulosin  0.4 mg Oral q AM  . warfarin  5 mg Oral ONCE-1600  . Warfarin - Pharmacist Dosing Inpatient   Does not apply q1600   Continuous Infusions: . ampicillin (OMNIPEN) IV 1 g (01/05/21 1212)     LOS: 1 day    Time spent: over 30 min    Fayrene Helper, MD [facility]   To contact the attending provider between 7A-7P or the covering provider during after hours 7P-7A, please log into the web site [email or web address] and access using universal [place] password for that web site. If you do not have the password, please call the hospital operator.  01/05/2021, 2:44 PM

## 2021-01-05 NOTE — Progress Notes (Signed)
ANTICOAGULATION CONSULT NOTE - follow up  Pharmacy Consult for warfarin Indication: atrial fibrillation  Allergies  Allergen Reactions  . Other Rash    Topical Surgical prep  - severe rash  . Chlorhexidine Hives and Other (See Comments)    And, sores appear where applied  . Hydrocodone Nausea And Vomiting and Other (See Comments)    Hydrocodone causes vomiting Hydrocodone causes vomiting  . Oxycodone Nausea And Vomiting and Other (See Comments)    Oral oxycodone causes vomiting  . Sertraline Hives, Swelling, Rash and Other (See Comments)    [DERM][OTHER]RASH WITH SWELLING    Patient Measurements: Height: 5\' 2"  (157.5 cm) Weight: 83 kg (182 lb 15.7 oz) IBW/kg (Calculated) : 50.1   Vital Signs: Temp: 98.2 F (36.8 C) (05/18 0520) Temp Source: Oral (05/18 0520) BP: 107/52 (05/18 0520) Pulse Rate: 90 (05/18 0520)  Labs: Recent Labs    01/03/21 1600 01/04/21 0351 01/05/21 0329  HGB 14.3 12.8 12.5  HCT 43.8 38.8 37.3  PLT 185 163 163  LABPROT 30.0* 27.7* 24.3*  INR 2.9* 2.6* 2.2*  CREATININE 0.59 0.56 0.48    Estimated Creatinine Clearance: 62.6 mL/min (by C-G formula based on SCr of 0.48 mg/dL).   Medical History: Past Medical History:  Diagnosis Date  . Abnormality of gait 03/01/2015  . Anxiety   . Arthritis    "knees" (01/19/2016)  . Asthma    as a child   . Back pain   . Bilateral carpal tunnel syndrome 10/04/2020  . Cancer ([place])    / RENAL CELL CARCINOMA - GETS CT SCAN EVERY 6 MONTHS TO WATCH   . CHF (congestive heart failure) ([place])   . Chronic pain    "nerve pain from the MS"  . Complex atypical endometrial hyperplasia   . Constipation   . Depression   . Diabetes mellitus without complication ([place])   . DVT (deep venous thrombosis) ([place]) 1983   "RLE; may have just been phlebitis; it was before the age of dopplers"  . Dysrhythmia    afib   . 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 GERD  (gastroesophageal reflux disease)   . HLD (hyperlipidemia)   . Hypertension   . Hypotension   . Hypothyroidism   . Joint pain   . Multiple sclerosis ([place])    has had this may 1989-[place] reg doc  . OSA on CPAP    bipap machine - SLEEP APNEA WORSE SINCE COVID IN 1/21 PER PATIENT SETTING HAS INCREASED FROM 9 TO 30   . Osteoarthritis   . PAF (paroxysmal atrial fibrillation) ([place])    on coumadin; documented on monitor 06/2010  . Pneumonia 11/2011  . Sleep apnea      Assessment: 75 yo female presents with several days of foul smelling urine and increased weakness.  Pharmacy consulted to dose warfarin for AFib.  HD 2.5mg  on Mondays and Thursdays, 5mg  all other days.   INR remains therapeutic  CBC ok  No reported bleeding  Goal of Therapy:  INR 2-3    Plan:   Continue with home warfarin dose as above - 5mg  today  Daily INR  Adrian Saran, PharmD, BCPS Secure Chat if ?s 01/05/2021 9:23 AM

## 2021-01-05 NOTE — Telephone Encounter (Signed)
done

## 2021-01-05 NOTE — NC FL2 (Signed)
[place] LEVEL OF CARE SCREENING TOOL     IDENTIFICATION  Patient Name: Toni [name] Birthdate: [birth year] Sex: female Admission Date (Current Location): 01/03/2021  [facility] and [place] Number:  Herbalist and Address:  [facility],  [place], [place]      Provider Number: [id number]  Attending Physician Name and Address:  Elodia Florence., *  Relative Name and Phone Number:  War[place]ton,Ronald Spouse [phone]  [phone], Witz,Kristin Daughter [phone]    Current Level of Care: Hospital Recommended Level of Care: [place] Prior Approval Number:    Date Approved/Denied:   PASRR Number: [id number]  Discharge Plan: SNF    Current Diagnoses: Patient Active Problem List   Diagnosis Date Noted  . Bladder spasms 01/04/2021  . UTI (urinary tract infection) 01/03/2021  . Bilateral carpal tunnel syndrome 10/04/2020  . Multiple sclerosis, 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[place]) 09/13/2020  . Chronic intermittent hypoxia with obstructive sleep apnea 09/13/2020  . COVID-19 long hauler manifesting chronic dyspnea 09/13/2020  . Cushing's syndrome, ACTH dependent ([place]) 09/13/2020  . Chronic diastolic CHF (congestive heart failure) ([place]) 09/13/2020  . Acute respiratory failure with hypoxia ([place]) 09/13/2020  . Class 2 severe obesity with serious comorbidity and body mass index (BMI) of 35.0 to 35.9 in adult [facility]) 09/13/2020  . Sepsis ([place]) 06/04/2020  . Acute metabolic encephalopathy 33/82/5053  . Atrial fibrillation with RVR ([place]) 04/07/2020  . Recurrent UTI 04/07/2020  . Uncomplicated degenerative myopia of both eyes 03/19/2020  . 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 Oxygen dependent 02/26/2020  . Neurogenic hypoventilation 02/26/2020  . Complex atypical endometrial hyperplasia   . Acute on chronic respiratory failure with hypoxia ([place]) 10/10/2019  . Multiple sclerosis ([place]) 10/10/2019  . COVID-19 virus  infection 08/23/2019  . Bilateral leg weakness 08/04/2019  . Weakness 08/04/2019  . Unilateral primary osteoarthritis, left knee 01/09/2019  . Type 2 diabetes mellitus without complication, without long-term current use of insulin ([place]) 08/27/2018  . Obstructive sleep apnea treated with bilevel positive airway pressure (BiPAP) 08/15/2018  . 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 ([place]) 11/21/2017  . Permanent atrial fibrillation ([place]) 11/21/2017  . Tightness of leg fascia 11/21/2017  . Muscle spasm of both lower legs 11/21/2017  . Carpal tunnel syndrome, left upper limb 09/13/2017  . Carpal tunnel syndrome, right upper limb 09/13/2017  . Other insomnia 06/11/2017  . Depression 06/11/2017  . Vitamin D deficiency 03/22/2017  . Right hand weakness 08/06/2016  . Nonischemic cardiomyopathy ([place]) 05/09/2016  . Localized edema 05/09/2016  . Long term current use of anticoagulant therapy 02/03/2016  . 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 Obesity hypoventilation syndrome ([place]) 03/30/2015  . Abnormality of gait 03/01/2015  . Chronic constipation with overflow incontinence 01/25/2015  . ACTH dependent Cushing's syndrome ([place]) 01/25/2015  . 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 Varicose veins of both lower extremities with complications 97/67/3419  . Long term (current) use of anticoagulants 11/05/2012  . Pseudophakia of both eyes 02/07/2012  . 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 Essential hypertension   . Atrial fibrillation ([place])   . Thyroid disease   . HLD (hyperlipidemia)     Orientation RESPIRATION BLADDER Height & Weight     Self,Time,Situation,Place  Normal Incontinent Weight: 83 kg Height:  5\' 2"  (157.5 cm)  BEHAVIORAL SYMPTOMS/MOOD NEUROLOGICAL BOWEL NUTRITION  STATUS     Convulsions/Seizures (History) Incontinent Diet (Heart Healthy)  AMBULATORY STATUS COMMUNICATION OF NEEDS Skin   Extensive Assist Verbally Normal                       Personal Care Assistance Level of Assistance  Bathing,Feeding,Dressing Bathing Assistance: Maximum assistance Feeding assistance: Independent Dressing Assistance: Maximum assistance     Functional Limitations Info  Sight,Hearing,Speech Sight Info: Adequate Hearing Info: Adequate Speech Info: Adequate    SPECIAL CARE FACTORS FREQUENCY  PT (By licensed PT),OT (By licensed OT)     PT Frequency: x5 week OT Frequency: x5 week            Contractures Contractures Info: Not present    Additional Factors Info  Code Status,Allergies,Psychotropic Code Status Info: FULL Allergies Info: Other, Chlorhexidine, Hydrocodone, Oxycodone, Sertraline Psychotropic Info: Wellbutrin, Lexapro, Please see discharge summary for doses         Current Medications (01/05/2021):  This is the current hospital active medication list Current Facility-Administered Medications  Medication Dose Route Frequency Provider Last Rate Last Admin  . acetaminophen (TYLENOL) tablet 650 mg  650 mg Oral Q6H PRN Rise Patience, MD   650 mg at 01/04/21 2035   Or  . acetaminophen (TYLENOL) suppository 650 mg  650 mg Rectal Q6H PRN Rise Patience, MD      . ALPRAZolam Duanne Moron) tablet 0.5 mg  0.5 mg Oral TID PRN Rise Patience, MD   0.5 mg at 01/03/21 2307  . ampicillin (OMNIPEN) 1 g in sodium chloride 0.9 % 100 mL IVPB  1 g Intravenous Q6H Elodia Florence., MD 300 mL/hr at 01/05/21 1212 1 g at 01/05/21 1212  . baclofen (LIORESAL) tablet 20 mg  20 mg Oral TID PRN Rise Patience, MD      . buPROPion (WELLBUTRIN XL) 24 hr tablet 150 mg  150 mg Oral Daily Rise Patience, MD   150 mg at 01/05/21 0841  . cholecalciferol (VITAMIN D) tablet 2,000 Units  2,000 Units Oral Daily Rise Patience, MD   2,000 Units at  01/05/21 [phone]  . diltiazem (CARDIZEM CD) 24 hr capsule 240 mg  240 mg Oral Daily Rise Patience, MD   240 mg at 01/05/21 0953  . diltiazem (CARDIZEM) tablet 60 mg  60 mg Oral TID PRN Dwyane Dee, MD      . docusate sodium (COLACE) capsule 200 mg  200 mg Oral Daily PRN Rise Patience, MD      . escitalopram (LEXAPRO) tablet 10 mg  10 mg Oral QHS Rise Patience, MD   10 mg at 01/04/21 2207  . famotidine (PEPCID) tablet 20 mg  20 mg Oral QHS Rise Patience, MD   20 mg at 01/04/21 2207  . furosemide (LASIX) tablet 40 mg  40 mg Oral BID Dwyane Dee, MD   40 mg at 01/05/21 7253  . gabapentin (NEURONTIN) capsule 300 mg  300 mg Oral Q6H Rise Patience, MD   300 mg at 01/05/21 6644  . lactulose (CHRONULAC) 10 GM/15ML solution 10 g  10 g Oral BID PRN Elodia [place]., MD      . levothyroxine (SYNTHROID) tablet 75 mcg  75 mcg Oral Q0600 Rise Patience, MD   75 mcg at 01/05/21 0559  . linaclotide (LINZESS) capsule 145 mcg  145 mcg Oral QAC breakfast Rise Patience, MD   145 mcg at 01/05/21 0347  . LORazepam (  ATIVAN) injection 1 mg  1 mg Intravenous Q4H PRN Dwyane Dee, MD   1 mg at 01/04/21 1240  . melatonin tablet 5 mg  5 mg Oral QHS Rise Patience, MD   5 mg at 01/04/21 2206  . multivitamin with minerals tablet 1 tablet  1 tablet Oral Daily Dwyane Dee, MD   1 tablet at 01/05/21 [phone]  . omega-3 acid ethyl esters (LOVAZA) capsule 1 g  1 g Oral Daily Rise Patience, MD   1 g at 01/05/21 [phone]  . ondansetron (ZOFRAN-ODT) disintegrating tablet 4 mg  4 mg Oral Q8H PRN Elodia [place]., MD      . oxybutynin [facility]) tablet 5 mg  5 mg Oral TID PRN Rise Patience, MD   5 mg at 01/04/21 2036  . pantoprazole (PROTONIX) EC tablet 40 mg  40 mg Oral Daily Dwyane Dee, MD   40 mg at 01/05/21 0842  . polyethylene glycol (MIRALAX / GLYCOLAX) packet 17 g  17 g Oral BID Elodia [place]., MD   17 g at 01/05/21 1338  . potassium  chloride SA (KLOR-CON) CR tablet 20 mEq  20 mEq Oral BID Dwyane Dee, MD   20 mEq at 01/05/21 0842  . simvastatin (ZOCOR) tablet 10 mg  10 mg Oral QPM Rise Patience, MD   10 mg at 01/04/21 1803  . tamsulosin (FLOMAX) capsule 0.4 mg  0.4 mg Oral q AM Rise Patience, MD   0.4 mg at 01/05/21 0600  . warfarin (COUMADIN) tablet 5 mg  5 mg Oral ONCE-1600 Adrian Saran,       . Warfarin - Pharmacist Dosing Inpatient   Does not apply [id number] Adrian Saran [facility]   Given at 01/04/21 1803     Discharge Medications: Please see discharge summary for a list of discharge medications.  Relevant Imaging Results:  Relevant Lab Results:   Additional Information [phone]  [place] Mouton, RN

## 2021-01-05 NOTE — TOC Progression Note (Signed)
Transition of Care [facility]) - Progression Note    Patient Details  Name: SHADAWN HANAWAY MRN: [id number] Date of Birth: [birth year]  Transition of Care [facility]) CM/SW Contact  [place] Mouton, RN Phone Number: 01/05/2021, 4:52 PM  Clinical Narrative:     Anderson Malta, RN, [place] [phone] called to ask for [facility] for pt to go to SNF. Explained that SNF [facility] Admission Coordinator will need to call. Spoke with Elyse Hsu from [place] who asked for [facility], related to pt transferring there for IDL, however now going to SNF. FL2 was completed.   Expected Discharge Plan: Skilled Nursing Facility Barriers to Discharge: No Barriers Identified  Expected Discharge Plan and Services Expected Discharge Plan: [place] Choice: [place] Living arrangements for the past 2 months: Single Family Home                                       Social Determinants of Health (SDOH) Interventions    Readmission Risk Interventions Readmission Risk Prevention Plan 06/11/2020  Transportation Screening Complete  PCP or Specialist appointment within 3-5 days of discharge Not Complete  PCP/Specialist Appt Not Complete comments MD office reports they have to call the pt and schedule this appt themselves when they receive notice of [place] or Home Care Consult Complete  SW Recovery Care/Counseling Consult Complete  [place] Not Applicable  Some recent data might be hidden

## 2021-01-06 DIAGNOSIS — N3000 Acute cystitis without hematuria: Secondary | ICD-10-CM | POA: Diagnosis not present

## 2021-01-06 LAB — PROTIME-INR
INR: 2 — ABNORMAL HIGH (ref 0.8–1.2)
Prothrombin Time: 22.9 seconds — ABNORMAL HIGH (ref 11.4–15.2)

## 2021-01-06 LAB — CBC WITH DIFFERENTIAL/PLATELET
Abs Immature Granulocytes: 0.01 10*3/uL (ref 0.00–0.07)
Basophils Absolute: 0.1 10*3/uL (ref 0.0–0.1)
Basophils Relative: 1 %
Eosinophils Absolute: 0.4 10*3/uL (ref 0.0–0.5)
Eosinophils Relative: 5 %
HCT: 36.8 % (ref 36.0–46.0)
Hemoglobin: 12.2 g/dL (ref 12.0–15.0)
Immature Granulocytes: 0 %
Lymphocytes Relative: 36 %
Lymphs Abs: 3 10*3/uL (ref 0.7–4.0)
MCH: 31.4 pg (ref 26.0–34.0)
MCHC: 33.2 g/dL (ref 30.0–36.0)
MCV: 94.6 fL (ref 80.0–100.0)
Monocytes Absolute: 1.1 10*3/uL — ABNORMAL HIGH (ref 0.1–1.0)
Monocytes Relative: 13 %
Neutro Abs: 3.8 10*3/uL (ref 1.7–7.7)
Neutrophils Relative %: 45 %
Platelets: 169 10*3/uL (ref 150–400)
RBC: 3.89 MIL/uL (ref 3.87–5.11)
RDW: 12.5 % (ref 11.5–15.5)
WBC: 8.4 10*3/uL (ref 4.0–10.5)
nRBC: 0 % (ref 0.0–0.2)

## 2021-01-06 LAB — COMPREHENSIVE METABOLIC PANEL
ALT: 15 U/L (ref 0–44)
AST: 15 U/L (ref 15–41)
Albumin: 3.2 g/dL — ABNORMAL LOW (ref 3.5–5.0)
Alkaline Phosphatase: 48 U/L (ref 38–126)
Anion gap: 3 — ABNORMAL LOW (ref 5–15)
BUN: 20 mg/dL (ref 8–23)
CO2: 35 mmol/L — ABNORMAL HIGH (ref 22–32)
Calcium: 8.6 mg/dL — ABNORMAL LOW (ref 8.9–10.3)
Chloride: 101 mmol/L (ref 98–111)
Creatinine, Ser: 0.6 mg/dL (ref 0.44–1.00)
GFR, Estimated: >60 mL/min (ref >60)
Glucose, Bld: 92 mg/dL (ref 70–99)
Potassium: 3.8 mmol/L (ref 3.5–5.1)
Sodium: 139 mmol/L (ref 135–145)
Total Bilirubin: 0.6 mg/dL (ref 0.3–1.2)
Total Protein: 5.6 g/dL — ABNORMAL LOW (ref 6.5–8.1)

## 2021-01-06 LAB — MAGNESIUM: Magnesium: 2 mg/dL (ref 1.7–2.4)

## 2021-01-06 MED ORDER — LACTINEX PO CHEW
2.0000 | CHEWABLE_TABLET | Freq: Three times a day (TID) | ORAL | Status: DC
  Administered 2021-01-06 – 2021-01-12 (×10): 2 via ORAL
  Filled 2021-01-06 (×25): qty 2

## 2021-01-06 MED ORDER — CALCIUM CARBONATE-VITAMIN D 500-200 MG-UNIT PO TABS
1.0000 | ORAL_TABLET | Freq: Every day | ORAL | Status: DC
  Administered 2021-01-06 – 2021-01-13 (×8): 1 via ORAL
  Filled 2021-01-06 (×9): qty 1

## 2021-01-06 MED ORDER — NYSTATIN 100000 UNIT/GM EX CREA: TOPICAL_CREAM | Freq: Two times a day (BID) | CUTANEOUS | Status: DC

## 2021-01-06 MED ORDER — FOSFOMYCIN TROMETHAMINE 3 G PO PACK
3.0000 g | PACK | ORAL | Status: AC
  Administered 2021-01-06 – 2021-01-09 (×2): 3 g via ORAL
  Filled 2021-01-06 (×2): qty 3

## 2021-01-06 MED ORDER — HYDROCORTISONE 1 % EX CREA
TOPICAL_CREAM | Freq: Two times a day (BID) | CUTANEOUS | Status: AC
  Administered 2021-01-09 – 2021-01-13 (×4): 1 via TOPICAL
  Filled 2021-01-06: qty 28

## 2021-01-06 MED ORDER — DILTIAZEM HCL ER COATED BEADS 120 MG PO CP24
120.0000 mg | ORAL_CAPSULE | Freq: Every day | ORAL | Status: DC
Start: 2021-01-07 — End: 2021-01-13
  Administered 2021-01-07 – 2021-01-13 (×7): 120 mg via ORAL
  Filled 2021-01-06 (×8): qty 1

## 2021-01-06 MED ORDER — WARFARIN SODIUM 5 MG PO TABS
5.0000 mg | ORAL_TABLET | Freq: Once | ORAL | Status: AC
  Administered 2021-01-06: 5 mg via ORAL
  Filled 2021-01-06: qty 1

## 2021-01-06 MED ORDER — CLOTRIMAZOLE 1 % EX CREA
TOPICAL_CREAM | Freq: Two times a day (BID) | CUTANEOUS | Status: DC
  Administered 2021-01-10 – 2021-01-13 (×3): 1 via TOPICAL
  Filled 2021-01-06: qty 15

## 2021-01-06 NOTE — Progress Notes (Addendum)
PROGRESS NOTE    Toni [name]  QIW:[id number] DOB: 15-Dec-1945 DOA: 01/03/2021 PCP: Prince Solian, MD   Chief Complaint  Patient presents with  . foul urine  . rectal discharge    Brief Narrative:  Toni [name] Toni [name] 75 y.o.femalewithhistory of multiple sclerosis largely bedbound, Toni [name]. 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  She was recently seen by her neurologist and her baclofen pump was adjusted for Toni [name] higher dose and also refilled (11/30/20).  She has Toni [name] history of recurrent UTIs and follows with urology and is being evaluated currently for possible suprapubic catheter placement. She has been on multiple antibiotic courses and most recently states that she was started on chronic suppressive antibiotic therapy (she thinks Keflex).  She was admitted for her bladder spasms and concern for recurrent UTI.  She was started on Rocephin on admission.  IV Ativan has worked for her ongoing bladder spasms.  Her case was also discussed with neuro on admission (see H&P; per Dr. Leonie Man, if no improvement in spasms after UTI treatment then neuro eval can be pursued).   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 Vs baclofen pump malfunction? - looks like Dr. Vertell Limber placed this on chart review, will reach out to his group tomorrow to reassess  pump - plan for now is to treat UTI 2-3 days; use PRN ativan for now; if no improvement then neuro consult to further assist; not sure if needs urology input (not yet), but possibly the indwelling foley which is new as of ~3 weeks ago may be causing irritation as well (this was exchanged on admission also)  Enterococcus Faecalis and Pseudomonas UTI  Indwelling Foley Catheter - transition to fosfomycin which should cover both, discussed with pharmacy - follow up culture  MS - home med on hold - continue xanax and ativan (PRN; the xanax is Toni [name] home med) - continue PRN baclofen, gaba, baclofen pump  Constipation - continue current regimen  Afib - continue diltiazem for now; hold parameters entered - diltiazem decreased to 120 mg daily, follow HR - continue coumadin per pharmacy  Central sleep apnea - continue BiPAP  Depression - Continue Lexapro and Wellbutrin  Intertrigo - trial of antifungal and steroid cream given pruritis (she notes this combination helped outpatient) - follow  DVT prophylaxis: warfarin Code Status: full  Family Communication: none at bedside Disposition:   Status is: Inpatient  Remains inpatient appropriate because:Inpatient level of care appropriate due to severity of illness   Dispo: The patient is from: Home              Anticipated d/c is to: Home              Patient currently is not medically stable to d/c.   Difficult to place patient No       Consultants:   none  Procedures:  none  Antimicrobials: Anti-infectives (From  admission, onward)   Start     Dose/Rate Route Frequency Ordered Stop   01/06/21 1200  fosfomycin (MONUROL) packet 3 g        3 g Oral every 72 hours 01/06/21 1052 01/12/21 1159   01/05/21 1215  ampicillin (OMNIPEN) 1 g in sodium chloride 0.9 % 100 mL IVPB  Status:  Discontinued        1 g 300 mL/hr over 20 Minutes Intravenous Every 6 hours 01/05/21 1117 01/06/21 1051   01/03/21 1845  cefTRIAXone (ROCEPHIN)  1 g in sodium chloride 0.9 % 100 mL IVPB  Status:  Discontinued        1 g 200 mL/hr over 30 Minutes Intravenous Every 24 hours 01/03/21 1837 01/05/21 1117         Subjective: Continued bladder  Objective: Vitals:   01/06/21 0946 01/06/21 0950 01/06/21 1105 01/06/21 1345  BP: 106/60 103/61 (!) 104/45 (!) 104/51  Pulse: 60   68  Resp:    14  Temp:    (!) 97.5 F (36.4 C)  TempSrc:    Oral  SpO2: 94%   96%  Weight:      Height:        Intake/Output Summary (Last 24 hours) at 01/06/2021 1806 Last data filed at 01/06/2021 1736 Gross per 24 hour  Intake 1360 ml  Output 2425 ml  Net -1065 ml   Filed Weights   01/03/21 1614  Weight: 83 kg    Examination:  General: No acute distress. Cardiovascular: RRR Lungs: unlabored Abdomen: Soft, mild suprapubic tenderness, nondistended Neurological: Alert and oriented 3. Moves all extremities 4. Cranial nerves II through XII grossly intact. Skin: Warm and dry. No rashes or lesions. Extremities: No clubbing or cyanosis. No edema.    Data Reviewed: I have personally reviewed following labs and imaging studies  CBC: Recent Labs  Lab 01/03/21 1600 01/04/21 0351 01/05/21 0329 01/06/21 0328  WBC 8.9 8.8 7.2 8.4  NEUTROABS 5.2  --  2.9 3.8  HGB 14.3 12.8 12.5 12.2  HCT 43.8 38.8 37.3 36.8  MCV 95.0 95.6 94.2 94.6  PLT 185 163 163 400    Basic Metabolic Panel: Recent Labs  Lab 01/03/21 1600 01/04/21 0351 01/05/21 0329 01/06/21 0328  NA 140 140 139 139  K 3.6 3.5 3.8 3.8  CL 98 100 99 101  CO2 33* 33* 33* 35*  GLUCOSE 101* 80 93 92  BUN 25* 23 21 20   CREATININE 0.59 0.56 0.48 0.60  CALCIUM 9.1 9.0 8.8* 8.6*  MG  --   --  2.0 2.0    GFR: Estimated Creatinine Clearance: 62.6 mL/min (by C-G formula based on SCr of 0.6 mg/dL).  Liver Function Tests: Recent Labs  Lab 01/03/21 1600 01/06/21 0328  AST 19 15  ALT 15 15  ALKPHOS 57 48  BILITOT 0.2* 0.6  PROT 6.7 5.6*  ALBUMIN 3.7 3.2*    CBG: No results  for input(s): GLUCAP in the last 168 hours.   Recent Results (from the past 240 hour(s))  Culture, blood (Routine x 2)     Status: None (Preliminary result)   Collection Time: 01/03/21  3:21 PM   Specimen: Right Antecubital; Blood  Result Value Ref Range Status   Specimen Description   Final    RIGHT ANTECUBITAL BLOOD Performed at [facility], [address], [place], [place] [address]    Special Requests   Final    BOTTLES DRAWN AEROBIC AND ANAEROBIC Blood Culture adequate  volume Performed at [facility], [place] [address] [place], [place] [address]    Culture   Final    NO GROWTH 3 DAYS Performed at [facility], [place] [address] [place], [place] [address]    Report Status PENDING  Incomplete  Culture, blood (Routine x 2)     Status: None (Preliminary result)   Collection Time: 01/03/21  3:26 PM   Specimen: BLOOD RIGHT HAND  Result Value Ref Range Status   Specimen Description   Final    BLOOD RIGHT HAND Performed at [facility] [address], [place], [place] [address]    Special Requests   Final    BOTTLES DRAWN AEROBIC AND ANAEROBIC Blood Culture adequate volume Performed at [facility] [address], [place], [place] [address]    Culture   Final    NO GROWTH 3 DAYS Performed at [facility], [place] [address], [place], [place] [address]    Report Status PENDING  Incomplete  Culture, Urine     Status: Abnormal (Preliminary result)   Collection Time: 01/03/21  4:00 PM   Specimen: Urine, Random  Result Value Ref Range Status   Specimen Description   Final    URINE, RANDOM Performed at [facility] [address] [place], [place] [address]    Special Requests   Final    NONE Performed at [facility], [place] [address] [place], [place] [address]    Culture (Mclane Arora)  Final    >=100,000 COLONIES/mL ENTEROCOCCUS FAECALIS 20,000 COLONIES/mL PSEUDOMONAS  AERUGINOSA SUSCEPTIBILITIES TO FOLLOW Performed at [facility], [place] [address] [place], [place] [address]    Report Status PENDING  Incomplete   Organism ID, Bacteria ENTEROCOCCUS FAECALIS (Marquerite Forsman)  Final      Susceptibility   Enterococcus faecalis - MIC*    AMPICILLIN <=2 SENSITIVE Sensitive     NITROFURANTOIN <=16 SENSITIVE Sensitive     VANCOMYCIN 1 SENSITIVE Sensitive     * >=100,000 COLONIES/mL ENTEROCOCCUS FAECALIS  SARS CORONAVIRUS 2 (TAT 6-24 HRS) Nasopharyngeal Nasopharyngeal Swab     Status: None   Collection Time: 01/03/21  8:57 PM   Specimen: Nasopharyngeal Swab  Result Value Ref Range Status   SARS Coronavirus 2 NEGATIVE NEGATIVE Final    Comment: (NOTE) SARS-CoV-2 target nucleic acids are NOT DETECTED.  The SARS-CoV-2 RNA is generally detectable in upper and lower respiratory specimens during the acute phase of infection. Negative results do not preclude SARS-CoV-2 infection, do not rule out co-infections with other pathogens, and should not be used as the sole basis for treatment or other patient management decisions. Negative results must be combined with clinical observations, patient history, and epidemiological information. The expected result is Negative.  Fact Sheet for Patients: SugarRoll.be  Fact Sheet for Healthcare Providers: https://www.woods-mathews.com/  This test is not yet approved or cleared by the [place] FDA and  has been authorized for detection and/or diagnosis of SARS-CoV-2 by FDA under an Emergency Use Authorization (EUA). This EUA will remain  in effect (meaning this test can be used) for the duration of the COVID-19 declaration under Se ction 564(b)(1) of the Act, 21 U.S.C. section 360bbb-3(b)(1), unless the authorization is terminated or revoked sooner.  Performed at [facility], [place] [address] [place], [place] [address]          Radiology Studies: No results  found.      Scheduled Meds: . buPROPion  150 mg Oral Daily  . calcium-vitamin D  1 tablet Oral Q breakfast  . cholecalciferol  2,000 Units Oral Daily  . [START ON 01/07/2021] diltiazem  120 mg Oral Daily  . escitalopram  10 mg Oral QHS  . famotidine  20 mg Oral QHS  . fosfomycin  3 g Oral Q72H  . furosemide  40 mg Oral BID  . gabapentin  300 mg Oral Q6H  . lactobacillus acidophilus & bulgar  2 tablet Oral TID WC  . levothyroxine  75 mcg Oral Q0600  . linaclotide  145 mcg Oral QAC breakfast  . melatonin  5 mg Oral QHS  . multivitamin with minerals  1 tablet Oral Daily  . omega-3 acid ethyl esters  1 g Oral Daily  . pantoprazole  40 mg Oral Daily  . polyethylene glycol  17 g Oral BID  . potassium chloride SA  20 mEq Oral BID  . saccharomyces boulardii  250 mg Oral BID  . simvastatin  10 mg Oral QPM  . tamsulosin  0.4 mg Oral q AM  . Warfarin - Pharmacist Dosing Inpatient   Does not apply q1600   Continuous Infusions:    LOS: 2 days    Time spent: over 30 min    Fayrene Helper, MD [facility]   To contact the attending provider between 7A-7P or the covering provider during after hours 7P-7A, please log into the web site [email or web address] and access using universal [place] password for that web site. If you do not have the password, please call the hospital operator.  01/06/2021, 6:06 PM

## 2021-01-06 NOTE — Consult Note (Signed)
   [facility] CM Inpatient Consult   01/06/2021  [name] [birth year] [id number]   Patient screened for high risk score for unplanned readmission. Chart reviewed to assess for potential [facility] community service needs. Per review, patient is being recommended for a skilled nursing facility level of care.    No [facility] Care Management follow up needs at this time.    Of note, [facility] Care Management services does not replace or interfere with any services that are arranged by inpatient case management or social work.  Netta Cedars, MSN, [facility] Liaison Nurse Mobile Phone [phone]  Toll free office [phone]

## 2021-01-06 NOTE — Progress Notes (Signed)
Occupational Therapy Evaluation Patient Details Name: Toni [name] MRN: [id number] DOB: 1945/12/15 Today's Date: 01/06/2021  Clinical Impression: Pt is typically at home and has 24 hour assist from family or caregiver. She is transferred using a lift (Sarah Plus style) and also has a WC and hospital bed. She can perform upper body ADL, and gets assist  for her back and lower body. Left side is stronger than the right at baseline and she has decreased grasp due to MS - uses built up handles and cups with handles. Today she is mod A to come sit EOB, she has limited shoulder ROM and requires assist for hair styling at baseline. Pt is able to complete face level grooming tasks in seated position with set up. She transferred to [facility] via Stedy and mod A +2 assist. Max A for rear peri care, and use of Stedy to get to built up recliner. Pt fatigued with multiple sit<>stands and needed more assist from lower surfaces. RN and NT aware. Pt's Daughter present at first, and caregiver present throughout. Pt will benefit from skilled OT in the acute setting as well as afterwards at the [facility] level (Pt was active with HHPT - but not OT) to maximize safety and independence in ADL and functional transfers. Next session plan on OOB activity continued work on transfers.     01/06/21 1200  OT Visit Information  Last OT Received On 01/06/21  Assistance Needed +2  PT/OT/SLP Co-Evaluation/Treatment Yes  Reason for Co-Treatment Complexity of the patient's impairments (multi-system involvement);For patient/therapist safety;To address functional/ADL transfers  PT goals addressed during session Mobility/safety with mobility;Balance;Proper use of DME;Strengthening/ROM  OT goals addressed during session ADL's and self-care;Proper use of Adaptive equipment and DME;Strengthening/ROM  History of Present Illness Pt is a 75 y.o. female admitted 01/03/21 for bladder spasms, blood pressure downtrending and low-grade temperature. PMH  includes: Anxiety, Arthritis, Asthma, Back pain, Bilateral carpal tunnel syndrome (10/04/2020), Cancer, CHF, Chronic pain, Complex atypical endometrial hyperplasia, Depression, DM, Dysrhythmia, Edema, GERD, HLD, Hypertension, Hypotension, Hypothyroidism, Multiple sclerosis (largely bed bound), OSA on CPAP, Osteoarthritis, PAF, Recurrant UTI (indwelling cath, being considered for suprapubic catheter), and Sleep apnea.  Precautions  Precautions Fall  Precaution Comments R ankle is sprained and requires wrapping or shoe prior to WB  Restrictions  Other Position/Activity Restrictions mindful of R ankle  Home Living  Family/patient expects to be discharged to: Private residence  Living Arrangements Spouse/significant other  Available Help at Discharge Family;Personal care attendant;Available 24 hours/day  Type of [place] Two level;Able to live on main level with bedroom/bathroom  Print production planner Handicapped height  Bathroom Accessibility Yes  Home Equipment BSC;Shower seat - built in;Grab bars - tub/shower;Grab bars - toilet;Hand held shower head;Hospital bed;Wheelchair - Education administrator (comment) (sit<>stand chair, and lift (like Sara Plus))  Additional Comments Hired assist 7 days a week; sit <> stand lift  Prior Function  Level of Independence Needs assistance  Gait / Transfers Assistance Needed use lift for transfers  ADL's / [place] able to dress her upper body, had assist for briefs and pants. Uses purewick and briefs for incontinence management. Aide assists with bathing. Husband/aide assists with IADLs  Comments able to mobilize with w/c   Communication  Communication No difficulties  Pain Assessment  Pain Assessment Faces  Faces Pain Scale 2  Pain Location R ankle with WB  Pain Descriptors / Indicators Discomfort;Grimacing  Pain Intervention(s) Limited activity within  patient's  tolerance;Monitored during session;Repositioned  Cognition  Arousal/Alertness Awake/alert  Behavior During Therapy WFL for tasks assessed/performed  Overall Cognitive Status Within Functional Limits for tasks assessed  Upper Extremity Assessment  Upper Extremity Assessment Generalized weakness (at baseline: decreased grasp due to MS - uses built up handles, cups with handles etc)  Lower Extremity Assessment  Lower Extremity Assessment Defer to PT evaluation  ADL  Overall ADL's  Needs assistance/impaired  Eating/Feeding Set up;Sitting;With adaptive utensils  Eating/Feeding Details (indicate cue type and reason) called down and spoke with cafeteria, they are going to put an empty coffee mug on her tray for her to have a cup with handle  Grooming Set up;Sitting  Grooming Details (indicate cue type and reason) in recliner  Upper Body Bathing Moderate assistance;Sitting;Set up  Upper Body Bathing Details (indicate cue type and reason) Pt able to clean front, including under breasts and underarms - but requires assist for back  Lower Body Bathing Total assistance  Upper Body Dressing  Minimal assistance;Sitting  Upper Body Dressing Details (indicate cue type and reason) new gown  Lower Body Dressing Total assistance  Toilet Transfer Moderate assistance;+2 for physical assistance;+2 for safety/equipment (stedy)  Toilet Transfer Details (indicate cue type and reason) stedy  Toileting- Clothing Manipulation and Hygiene Total assistance;Sit to/from stand  Toileting - Clothing Manipulation Details (indicate cue type and reason) therapy performing rear peri care in standing  Functional mobility during ADLs  (Stedy)  Bed Mobility  Overal bed mobility Needs Assistance  Bed Mobility Supine to Sit  Supine to sit Mod assist;HOB elevated  General bed mobility comments Pt assists with rail for trunk elevation, use of bed pad to bring hips EOB, Pt able to scoot forward so feet are on the ground   Transfers  Overall transfer level Needs assistance  Transfer via Actuary  Transfers Sit to/from Stand  Sit to Stand Mod assist;Max assist;+2 physical assistance;+2 safety/equipment;From elevated surface  Stand pivot transfers Total assist (in [place])  General transfer comment Pt sit<>stand x4 with progressive fatigue and needing increased assist from lower surfaces mod A +2 and progressing to max A +2  Balance  Overall balance assessment Needs assistance  Sitting-balance support Feet supported  Sitting balance-Leahy Scale Fair  Standing balance support Bilateral upper extremity supported  Standing balance-Leahy Scale Zero  Standing balance comment dependent on Stedy and external support  General Comments  General comments (skin integrity, edema, etc.) Daughter present initially, caregiver present throughout and helpful. built up red handle provided for built up handles  OT - End of Session  Equipment Utilized During Treatment Gait belt Charlaine Dalton)  Activity Tolerance Patient tolerated treatment well  Patient left in chair;with call bell/phone within reach;with family/visitor present  Nurse Communication Mobility status;Need for lift equipment;Precautions (R ankle)  OT Assessment  OT Recommendation/Assessment Patient needs continued OT Services  OT Visit Diagnosis Other abnormalities of gait and mobility (R26.89);Muscle weakness (generalized) (M62.81);Pain  Pain - Right/Left Right  Pain - part of body Ankle and joints of foot  OT Problem List Decreased strength;Decreased range of motion;Decreased activity tolerance;Impaired balance (sitting and/or standing);Obesity;Pain  OT Plan  OT Frequency (ACUTE ONLY) Min 2X/week  OT Treatment/Interventions (ACUTE ONLY) Self-care/ADL training;Therapeutic exercise;Neuromuscular education;DME and/or AE instruction;Therapeutic activities;Patient/family education;Balance training  AM-PAC OT "6 Clicks" Daily Activity Outcome Measure (Version  2)  Help from another person eating meals? 4  Help from another person taking care of personal grooming? 3  Help from another person toileting, which includes using toliet, bedpan,  or urinal? 2  Help from another person bathing (including washing, rinsing, drying)? 2  Help from another person to put on and taking off regular upper body clothing? 3  Help from another person to put on and taking off regular lower body clothing? 1  6 Click Score 15  OT Recommendation  Follow Up Recommendations Home health OT;Supervision/Assistance - 24 hour  OT Equipment None recommended by OT (Pt has appropriate DME/AE)  Individuals Consulted  Consulted and Agree with Results and Recommendations Patient;Family member/caregiver  Family Member Consulted Daughter  Acute Rehab OT Goals  Patient Stated Goal get back home and be as independent as possible  OT Goal Formulation With patient  Time For Goal Achievement 01/20/21  Potential to Achieve Goals Good  OT Time Calculation  OT Start Time (ACUTE ONLY) 1051  OT Stop Time (ACUTE ONLY) 1146  OT Time Calculation (min) 55 min  OT General Charges  $OT Visit 1 Visit  OT Evaluation  $OT Eval Moderate Complexity 1 Mod  OT Treatments  $Self Care/Home Management  8-22 mins  Written Expression  Dominant Hand Right   Jesse Sans OTR/L Acute Rehabilitation Services Pager: [phone] Office: [phone]

## 2021-01-06 NOTE — Progress Notes (Signed)
ANTICOAGULATION CONSULT NOTE - follow up  Pharmacy Consult for warfarin Indication: atrial fibrillation  Allergies  Allergen Reactions  . Other Rash    Topical Surgical prep  - severe rash  . Chlorhexidine Hives and Other (See Comments)    And, sores appear where applied  . Hydrocodone Nausea And Vomiting and Other (See Comments)    Hydrocodone causes vomiting Hydrocodone causes vomiting  . Oxycodone Nausea And Vomiting and Other (See Comments)    Oral oxycodone causes vomiting  . Sertraline Hives, Swelling, Rash and Other (See Comments)    [DERM][OTHER]RASH WITH SWELLING    Patient Measurements: Height: 5\' 2"  (157.5 cm) Weight: 83 kg (182 lb 15.7 oz) IBW/kg (Calculated) : 50.1   Vital Signs: Temp: 97.5 F (36.4 C) (05/19 1345) Temp Source: Oral (05/19 1345) BP: 104/51 (05/19 1345) Pulse Rate: 68 (05/19 1345)  Labs: Recent Labs    01/04/21 0351 01/05/21 0329 01/06/21 0328  HGB 12.8 12.5 12.2  HCT 38.8 37.3 36.8  PLT 163 163 169  LABPROT 27.7* 24.3* 22.9*  INR 2.6* 2.2* 2.0*  CREATININE 0.56 0.48 0.60    Estimated Creatinine Clearance: 62.6 mL/min (by C-G formula based on SCr of 0.6 mg/dL).   Medical History: Past Medical History:  Diagnosis Date  . Abnormality of gait 03/01/2015  . Anxiety   . Arthritis    "knees" (01/19/2016)  . Asthma    as a child   . Back pain   . Bilateral carpal tunnel syndrome 10/04/2020  . Cancer ([place])    / RENAL CELL CARCINOMA - GETS CT SCAN EVERY 6 MONTHS TO WATCH   . CHF (congestive heart failure) ([place])   . Chronic pain    "nerve pain from the MS"  . Complex atypical endometrial hyperplasia   . Constipation   . Depression   . Diabetes mellitus without complication ([place])   . DVT (deep venous thrombosis) ([place]) 1983   "RLE; may have just been phlebitis; it was before the age of dopplers"  . Dysrhythmia    afib   . 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 GERD  (gastroesophageal reflux disease)   . HLD (hyperlipidemia)   . Hypertension   . Hypotension   . Hypothyroidism   . Joint pain   . Multiple sclerosis ([place])    has had this may 1989-[place] reg doc  . OSA on CPAP    bipap machine - SLEEP APNEA WORSE SINCE COVID IN 1/21 PER PATIENT SETTING HAS INCREASED FROM 9 TO 30   . Osteoarthritis   . PAF (paroxysmal atrial fibrillation) ([place])    on coumadin; documented on monitor 06/2010  . Pneumonia 11/2011  . Sleep apnea      Assessment: 75 yo female presents with several days of foul smelling urine and increased weakness.  Pharmacy consulted to dose warfarin for AFib.  HD 2.5mg  on Mondays and Thursdays, 5mg  all other days.   INR therapeutic but trending down and on low end of normal range  CBC ok  No reported bleeding  Goal of Therapy:  INR 2-3    Plan:   Due to dropping INR, will give 5mg  today instead of usual home dose of 2.5mg   Daily INR  Adrian Saran, PharmD, BCPS Secure Chat if ?s 01/06/2021 2:24 PM

## 2021-01-06 NOTE — Evaluation (Signed)
Physical Therapy Evaluation Patient Details Name: Toni [name] MRN: [id number] DOB: [birth year] Today's Date: 01/06/2021   History of Present Illness  Pt is a 75 y.o. female admitted 01/03/21 for bladder spasms, blood pressure downtrending and low-grade temperature. PMH includes: Anxiety, Arthritis, Asthma, Back pain, Bilateral carpal tunnel syndrome (10/04/2020), Cancer, CHF, Chronic pain, Complex atypical endometrial hyperplasia, Depression, DM, Dysrhythmia, Edema, GERD, HLD, Hypertension, Hypotension, Hypothyroidism, Multiple sclerosis (largely bed bound), OSA on CPAP, Osteoarthritis, PAF, Recurrant UTI (indwelling cath, being considered for suprapubic catheter), and Sleep apnea.  Clinical Impression  Pt admitted with above diagnosis. Mod A for supine to sit, mod to Max A of 2 for sit to stand with Stedy x 4 trials. Assisted pt to transfer with stedy from bed to 3 in 1, then to recliner. Performed BLE strengthening exercises. Pt has 24* assistance at home. She was receiving HHPT prior to admission, resumption of HHPT recommended. Pt is very pleasant and motivated.  Pt currently with functional limitations due to the deficits listed below (see PT Problem List). Pt will benefit from skilled PT to increase their independence and safety with mobility to allow discharge to the venue listed below.       Follow Up Recommendations Home health PT    Equipment Recommendations  None recommended by PT    Recommendations for Other Services       Precautions / Restrictions Precautions Precautions: Fall Precaution Comments: R ankle is sprained and requires wrapping or shoe prior to WB Restrictions Weight Bearing Restrictions: No Other Position/Activity Restrictions: mindful of R ankle      Mobility  Bed Mobility Overal bed mobility: Needs Assistance Bed Mobility: Supine to Sit     Supine to sit: Mod assist;HOB elevated     General bed mobility comments: assist to pivot hip and advance RLE,  pt pulled up on bedrail; mild dizziness in sitting, BP 104/45 sitting.    Transfers Overall transfer level: Needs assistance   Transfers: Sit to/from Stand;Stand Pivot Transfers Sit to Stand: +2 physical assistance;+2 safety/equipment;Mod assist;Max assist Stand pivot transfers: +2 physical assistance;+2 safety/equipment;Total assist       General transfer comment: sit to stand x 4 trials with Charlaine Dalton, first couple trials Mod A to 2 to stand, then Max A of 2 for final trial 2* pt fatigue  Ambulation/Gait             General Gait Details: non ambulatory at baseline  Stairs            Wheelchair Mobility    Modified Rankin (Stroke Patients Only)       Balance Overall balance assessment: Needs assistance Sitting-balance support: Feet supported Sitting balance-Leahy Scale: Fair       Standing balance-Leahy Scale: Zero                               Pertinent Vitals/Pain Pain Assessment: Faces Faces Pain Scale: Hurts a little bit Pain Location: R ankle with WB Pain Descriptors / Indicators: Discomfort;Grimacing Pain Intervention(s): Limited activity within patient's tolerance;Monitored during session;Repositioned    Home Living Family/patient expects to be discharged to:: Private residence Living Arrangements: Spouse/significant other Available Help at Discharge: Family;Available 24 hours/day;Personal care attendant Type of Home: House Home Access: Ramped entrance     Home Layout: Two level;Able to live on main level with bedroom/bathroom Home Equipment: Bedside commode;Shower seat - built in;Grab bars - tub/shower;Grab bars - toilet;Hand held shower head;Hospital bed;Wheelchair -  manual;Other (comment) Additional Comments: Hired assist 7 days a week; sit <> stand lift    Prior Function Level of Independence: Needs assistance   Gait / Transfers Assistance Needed: sit to stand mechanical lift for transfers to [facility]  ADL's / Homemaking Assistance  Needed: able to dress her upper body, had assist for briefs and pants. Uses purewick and briefs for incontinence management. Aide assists with bathing. Husband/aide assists with IADLs  Comments: able to mobilize with w/c      Hand Dominance   Dominant Hand: Right    Extremity/Trunk Assessment   Upper Extremity Assessment Upper Extremity Assessment: Defer to OT evaluation    Lower Extremity Assessment Lower Extremity Assessment: RLE deficits/detail;LLE deficits/detail RLE Deficits / Details: knee ext -3/5, pt reports recent R ankle sprain (an aide lowered her to the floor with lift and ankle twisted), pt has ace wrap that I applied to R ankle (she also has a boot which is at home), R ankle DF AROM to ~0* RLE Sensation: history of peripheral neuropathy LLE Deficits / Details: knee ext 4/5    Cervical / Trunk Assessment Cervical / Trunk Assessment: Normal  Communication   Communication: No difficulties  Cognition Arousal/Alertness: Awake/alert Behavior During Therapy: WFL for tasks assessed/performed Overall Cognitive Status: Within Functional Limits for tasks assessed                                        General Comments      Exercises General Exercises - Lower Extremity Ankle Circles/Pumps: AROM;Both;10 reps;Supine Long Arc Quad: AROM;Both;10 reps;Seated Hip Flexion/Marching: AROM;Both;5 reps;Seated   Assessment/Plan    PT Assessment Patient needs continued PT services  PT Problem List Decreased mobility;Decreased strength;Decreased activity tolerance;Decreased balance       PT Treatment Interventions      PT Goals (Current goals can be found in the Care Plan section)  Acute Rehab PT Goals Patient Stated Goal: resume HHPT PT Goal Formulation: With patient/family Time For Goal Achievement: 01/20/21 Potential to Achieve Goals: Fair    Frequency Min 3X/week   Barriers to discharge        Co-evaluation PT/OT/SLP Co-Evaluation/Treatment:  Yes Reason for Co-Treatment: Complexity of the patient's impairments (multi-system involvement);For patient/therapist safety;To address functional/ADL transfers PT goals addressed during session: Mobility/safety with mobility;Balance;Proper use of DME OT goals addressed during session: ADL's and self-care;Proper use of Adaptive equipment and DME;Strengthening/ROM       AM-PAC PT "6 Clicks" Mobility  Outcome Measure Help needed turning from your back to your side while in a flat bed without using bedrails?: A Lot Help needed moving from lying on your back to sitting on the side of a flat bed without using bedrails?: A Lot Help needed moving to and from a bed to a chair (including a wheelchair)?: Total Help needed standing up from a chair using your arms (e.g., wheelchair or bedside chair)?: Total Help needed to walk in hospital room?: Total Help needed climbing 3-5 steps with a railing? : Total 6 Click Score: 8    End of Session Equipment Utilized During Treatment: Gait belt Activity Tolerance: Patient tolerated treatment well;Patient limited by fatigue Patient left: in chair;with call bell/phone within reach;with chair alarm set;with family/visitor present Nurse Communication: Mobility status;Need for lift equipment PT Visit Diagnosis: Muscle weakness (generalized) (M62.81);Unsteadiness on feet (R26.81);Other abnormalities of gait and mobility (R26.89)    Time: 9326-7124 PT Time Calculation (min) (  ACUTE ONLY): 55 min   Charges:   PT Evaluation $PT Eval Moderate Complexity: 1 Mod PT Treatments $Therapeutic Activity: 8-22 mins        Blondell Reveal Kistler PT 01/06/2021  Acute Rehabilitation Services Pager [phone] Office [phone]

## 2021-01-06 NOTE — TOC Progression Note (Signed)
Transition of Care [facility]) - Progression Note    Patient Details  Name: ELZIE SHEETS MRN: [id number] Date of Birth: [birth year]  Transition of Care [facility] CM/SW Contact  Ross Ludwig, [place] Phone Number: 01/06/2021, 5:23 PM  Clinical Narrative:     CSW spoke to Elyse Hsu at [facility], and they said if patient is medically ready and has another negative Covid test they can accept patient on Monday.   Expected Discharge Plan: Skilled Nursing Facility Barriers to Discharge: No Barriers Identified  Expected Discharge Plan and Services Expected Discharge Plan: [place] Choice: [place] Living arrangements for the past 2 months: Single Family Home                                       Social Determinants of Health (SDOH) Interventions    Readmission Risk Interventions Readmission Risk Prevention Plan 06/11/2020  Transportation Screening Complete  PCP or Specialist appointment within 3-5 days of discharge Not Complete  PCP/Specialist Appt Not Complete comments MD office reports they have to call the pt and schedule this appt themselves when they receive notice of [place] or Home Care Consult Complete  SW Recovery Care/Counseling Consult Complete  [place] Not Applicable  Some recent data might be hidden

## 2021-01-07 DIAGNOSIS — N3000 Acute cystitis without hematuria: Secondary | ICD-10-CM | POA: Diagnosis not present

## 2021-01-07 LAB — CBC WITH DIFFERENTIAL/PLATELET
Abs Immature Granulocytes: 0.01 10*3/uL (ref 0.00–0.07)
Basophils Absolute: 0 10*3/uL (ref 0.0–0.1)
Basophils Relative: 1 %
Eosinophils Absolute: 0.5 10*3/uL (ref 0.0–0.5)
Eosinophils Relative: 6 %
HCT: 38.5 % (ref 36.0–46.0)
Hemoglobin: 12.6 g/dL (ref 12.0–15.0)
Immature Granulocytes: 0 %
Lymphocytes Relative: 28 %
Lymphs Abs: 2.4 10*3/uL (ref 0.7–4.0)
MCH: 31.5 pg (ref 26.0–34.0)
MCHC: 32.7 g/dL (ref 30.0–36.0)
MCV: 96.3 fL (ref 80.0–100.0)
Monocytes Absolute: 1 10*3/uL (ref 0.1–1.0)
Monocytes Relative: 12 %
Neutro Abs: 4.6 10*3/uL (ref 1.7–7.7)
Neutrophils Relative %: 53 %
Platelets: 173 10*3/uL (ref 150–400)
RBC: 4 MIL/uL (ref 3.87–5.11)
RDW: 12.4 % (ref 11.5–15.5)
WBC: 8.6 10*3/uL (ref 4.0–10.5)
nRBC: 0 % (ref 0.0–0.2)

## 2021-01-07 LAB — COMPREHENSIVE METABOLIC PANEL
ALT: 17 U/L (ref 0–44)
AST: 18 U/L (ref 15–41)
Albumin: 3.4 g/dL — ABNORMAL LOW (ref 3.5–5.0)
Alkaline Phosphatase: 54 U/L (ref 38–126)
Anion gap: 4 — ABNORMAL LOW (ref 5–15)
BUN: 19 mg/dL (ref 8–23)
CO2: 35 mmol/L — ABNORMAL HIGH (ref 22–32)
Calcium: 8.8 mg/dL — ABNORMAL LOW (ref 8.9–10.3)
Chloride: 101 mmol/L (ref 98–111)
Creatinine, Ser: 0.52 mg/dL (ref 0.44–1.00)
GFR, Estimated: >60 mL/min (ref >60)
Glucose, Bld: 97 mg/dL (ref 70–99)
Potassium: 4.1 mmol/L (ref 3.5–5.1)
Sodium: 140 mmol/L (ref 135–145)
Total Bilirubin: 0.5 mg/dL (ref 0.3–1.2)
Total Protein: 6 g/dL — ABNORMAL LOW (ref 6.5–8.1)

## 2021-01-07 LAB — URINE CULTURE: Culture: >=100000 — AB

## 2021-01-07 LAB — PROTIME-INR
INR: 2.3 — ABNORMAL HIGH (ref 0.8–1.2)
Prothrombin Time: 25.5 seconds — ABNORMAL HIGH (ref 11.4–15.2)

## 2021-01-07 LAB — MAGNESIUM: Magnesium: 2 mg/dL (ref 1.7–2.4)

## 2021-01-07 LAB — PHOSPHORUS: Phosphorus: 3.9 mg/dL (ref 2.5–4.6)

## 2021-01-07 MED ORDER — WARFARIN SODIUM 5 MG PO TABS
5.0000 mg | ORAL_TABLET | Freq: Once | ORAL | Status: AC
  Administered 2021-01-07: 5 mg via ORAL
  Filled 2021-01-07: qty 1

## 2021-01-07 MED ORDER — PHENAZOPYRIDINE HCL 200 MG PO TABS
200.0000 mg | ORAL_TABLET | Freq: Three times a day (TID) | ORAL | Status: DC
  Administered 2021-01-07 – 2021-01-08 (×2): 200 mg via ORAL
  Filled 2021-01-07 (×4): qty 1

## 2021-01-07 NOTE — Progress Notes (Signed)
PROGRESS NOTE    Toni [name]  YQM:[id number] DOB: [birth year] DOA: 01/03/2021 PCP: Prince Solian, MD   Chief Complaint  Patient presents with  . foul urine  . rectal discharge    Brief Narrative:  Toni [name] Toni [name] 75 y.o.femalewithhistory of multiple sclerosis largely bedbound, Toni [name]. 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  She was recently seen by her neurologist and her baclofen pump was adjusted for [place] higher dose and also refilled (11/30/20).  She has Sotiria Keast history of recurrent UTIs and follows with urology and is being evaluated currently for possible suprapubic catheter placement. She has been on multiple antibiotic courses and most recently states that she was started on chronic suppressive antibiotic therapy (she thinks Keflex).  She was admitted for her bladder spasms and concern for recurrent UTI.  She was started on Rocephin on admission.  IV Ativan has worked for her ongoing bladder spasms.  Her case was also discussed with neuro on admission (see H&P; per Dr. Leonie Man, if no improvement in spasms after UTI treatment then neuro eval can be pursued).   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 Vs baclofen pump malfunction? - appreciate neurosurgery assistance - plan for now is to treat UTI 2-3 days; use PRN ativan for now; if  no improvement then neuro consult to further assist; not sure if needs urology input (not yet), but possibly the indwelling foley which is new as of ~3 weeks ago may be causing irritation as well (this was exchanged on admission also)  MS  Worsening muscle spasms - home med on hold - concern for baclofen pum malfunction - appreciate neurosurgery recommendations -> follow MRI C/T spine, pump will need to be interrogated after MRI - if concern for baclofen withdrawal call back - continue xanax and ativan (PRN; the xanax is Monick Rena home med) - continue PRN baclofen, gaba, baclofen pump  Enterococcus Faecalis and Pseudomonas UTI  Indwelling Foley Catheter - transition to fosfomycin which should cover both, discussed with pharmacy - follow up culture (pansensitive enterococcus, pseudomonas sensitive to ceftaz, imipenem, zosyn) - foley placed to allow labial wound to heal it sounds like, will consider d/c indwelling foley  Constipation - continue current regimen  Afib - continue diltiazem for now; hold parameters entered - diltiazem decreased to 120 mg daily, follow HR - continue coumadin per pharmacy  Central sleep apnea - continue BiPAP  Depression - Continue Lexapro and Wellbutrin  Intertrigo - trial of antifungal and steroid cream given pruritis (she notes this combination helped outpatient) - follow  DVT prophylaxis: warfarin Code Status: full  Family Communication: daughter at bedside Disposition:   Status is: Inpatient  Remains inpatient appropriate because:Inpatient level of care appropriate due to severity of illness   Dispo: The patient is from: Home              Anticipated d/c is to: Home  Patient currently is not medically stable to d/c.   Difficult to place patient No       Consultants:   none  Procedures:  none  Antimicrobials: Anti-infectives (From admission, onward)   Start     Dose/Rate Route Frequency Ordered Stop   01/06/21 1200   fosfomycin (MONUROL) packet 3 g        3 g Oral every 72 hours 01/06/21 1052 01/12/21 1159   01/05/21 1215  ampicillin (OMNIPEN) 1 g in sodium chloride 0.9 % 100 mL IVPB  Status:  Discontinued        1 g 300 mL/hr over 20 Minutes Intravenous Every 6 hours 01/05/21 1117 01/06/21 1051   01/03/21 1845  cefTRIAXone (ROCEPHIN) 1 g in sodium chloride 0.9 % 100 mL IVPB  Status:  Discontinued        1 g 200 mL/hr over 30 Minutes Intravenous Every 24 hours 01/03/21 1837 01/05/21 1117         Subjective: Dysuria, continued spasms  Objective: Vitals:   01/06/21 1345 01/06/21 2109 01/07/21 0604 01/07/21 1023  BP: (!) 104/51 (!) 110/55 (!) 104/59 109/64  Pulse: 68 66 60   Resp: 14 20 16    Temp: (!) 97.5 F (36.4 C) 98.7 F (37.1 C) 98.3 F (36.8 C)   TempSrc: Oral Oral Oral   SpO2: 96% 100% 100%   Weight:      Height:        Intake/Output Summary (Last 24 hours) at 01/07/2021 1630 Last data filed at 01/07/2021 0102 Gross per 24 hour  Intake --  Output 2625 ml  Net -2625 ml   Filed Weights   01/03/21 1614  Weight: 83 kg    Examination:  General: No acute distress. Cardiovascular: Heart sounds show Syrina Wake regular rate, and rhythm Lungs: Clear to auscultation bilaterally  Abdomen: Soft, nontender, nondistended Neurological: Alert and oriented 3. Moves all extremities 4. Cranial nerves II through XII grossly intact. Skin: Warm and dry. No rashes or lesions. Extremities: No clubbing or cyanosis. No edema.   Data Reviewed: I have personally reviewed following labs and imaging studies  CBC: Recent Labs  Lab 01/03/21 1600 01/04/21 0351 01/05/21 0329 01/06/21 0328 01/07/21 0332  WBC 8.9 8.8 7.2 8.4 8.6  NEUTROABS 5.2  --  2.9 3.8 4.6  HGB 14.3 12.8 12.5 12.2 12.6  HCT 43.8 38.8 37.3 36.8 38.5  MCV 95.0 95.6 94.2 94.6 96.3  PLT 185 163 163 169 725    Basic Metabolic Panel: Recent Labs  Lab 01/03/21 1600 01/04/21 0351 01/05/21 0329 01/06/21 0328 01/07/21 0332  NA  140 140 139 139 140  K 3.6 3.5 3.8 3.8 4.1  CL 98 100 99 101 101  CO2 33* 33* 33* 35* 35*  GLUCOSE 101* 80 93 92 97  BUN 25* 23 21 20 19   CREATININE 0.59 0.56 0.48 0.60 0.52  CALCIUM 9.1 9.0 8.8* 8.6* 8.8*  MG  --   --  2.0 2.0 2.0  PHOS  --   --   --   --  3.9    GFR: Estimated Creatinine Clearance: 62.6 mL/min (by C-G formula based on SCr of 0.52 mg/dL).  Liver Function Tests: Recent Labs  Lab 01/03/21 1600 01/06/21 0328 01/07/21 0332  AST 19 15 18   ALT 15 15 17   ALKPHOS 57 48 54  BILITOT 0.2* 0.6 0.5  PROT 6.7 5.6* 6.0*  ALBUMIN 3.7 3.2* 3.4*    CBG: No results for input(s): GLUCAP in the last  168 hours.   Recent Results (from the past 240 hour(s))  Culture, blood (Routine x 2)     Status: None (Preliminary result)   Collection Time: 01/03/21  3:21 PM   Specimen: Right Antecubital; Blood  Result Value Ref Range Status   Specimen Description   Final    RIGHT ANTECUBITAL BLOOD Performed at [facility], [address] [place], [place] [address]    Special Requests   Final    BOTTLES DRAWN AEROBIC AND ANAEROBIC Blood Culture adequate volume Performed at [facility] [address], [place], [place] [address]    Culture   Final    NO GROWTH 4 DAYS Performed at [facility], [place] [address] [place], [place] [address]    Report Status PENDING  Incomplete  Culture, blood (Routine x 2)     Status: None (Preliminary result)   Collection Time: 01/03/21  3:26 PM   Specimen: BLOOD RIGHT HAND  Result Value Ref Range Status   Specimen Description   Final    BLOOD RIGHT HAND Performed at [facility] [address], [place], [place] [address]    Special Requests   Final    BOTTLES DRAWN AEROBIC AND ANAEROBIC Blood Culture adequate volume Performed at [facility] [address] [place], [place] [address]    Culture   Final    NO GROWTH 4 DAYS Performed at [facility], [place] [address], [place], [place] [address]    Report Status PENDING  Incomplete  Culture, Urine     Status: Abnormal   Collection Time: 01/03/21  4:00 PM   Specimen: Urine, Random  Result Value Ref Range Status   Specimen Description   Final    URINE, RANDOM Performed at [facility] [address], [place], [place] [address]    Special Requests   Final    NONE Performed at [facility], [place] [address], [place], [place] [address]    Culture (Ranen Doolin)  Final    >=100,000 COLONIES/mL ENTEROCOCCUS FAECALIS 20,000 COLONIES/mL PSEUDOMONAS AERUGINOSA    Report Status 01/07/2021 FINAL  Final   Organism ID, Bacteria ENTEROCOCCUS FAECALIS (Sulma Ruffino)  Final   Organism ID, Bacteria PSEUDOMONAS AERUGINOSA (Elfriede Bonini)  Final      Susceptibility   Enterococcus faecalis - MIC*    AMPICILLIN <=2 SENSITIVE Sensitive     NITROFURANTOIN <=16 SENSITIVE Sensitive     VANCOMYCIN 1 SENSITIVE Sensitive     * >=100,000 COLONIES/mL ENTEROCOCCUS FAECALIS   Pseudomonas aeruginosa - MIC*    CEFTAZIDIME 8 SENSITIVE Sensitive     CIPROFLOXACIN 2 INTERMEDIATE Intermediate     GENTAMICIN 8 INTERMEDIATE Intermediate     IMIPENEM 2 SENSITIVE Sensitive     PIP/TAZO 16 SENSITIVE Sensitive     * 20,000 COLONIES/mL PSEUDOMONAS AERUGINOSA  SARS CORONAVIRUS 2 (TAT 6-24 HRS) Nasopharyngeal Nasopharyngeal Swab     Status: None   Collection Time: 01/03/21  8:57 PM   Specimen: Nasopharyngeal Swab  Result Value Ref Range Status   SARS Coronavirus 2 NEGATIVE NEGATIVE Final    Comment: (NOTE) SARS-CoV-2 target nucleic acids are NOT DETECTED.  The SARS-CoV-2 RNA is generally detectable in upper and lower respiratory specimens during the acute phase of infection. Negative results do not preclude SARS-CoV-2 infection, do not rule out co-infections with other pathogens, and should not be used as the sole basis for treatment or other patient management decisions. Negative results must be  combined with clinical  observations, patient history, and epidemiological information. The expected result is Negative.  Fact Sheet for Patients: SugarRoll.be  Fact Sheet for Healthcare Providers: https://www.woods-mathews.com/  This test is not yet approved or cleared by the [place] FDA and  has been authorized for detection and/or diagnosis of SARS-CoV-2 by FDA under an Emergency Use Authorization (EUA). This EUA will remain  in effect (meaning this test can be used) for the duration of the COVID-19 declaration under Se ction 564(b)(1) of the Act, 21 U.S.C. section 360bbb-3(b)(1), unless the authorization is terminated or revoked sooner.  Performed at [facility], [place] [address] [place], [place] [address]          Radiology Studies: No results found.      Scheduled Meds: . buPROPion  150 mg Oral Daily  . calcium-vitamin D  1 tablet Oral Q breakfast  . cholecalciferol  2,000 Units Oral Daily  . clotrimazole   Topical BID  . diltiazem  120 mg Oral Daily  . escitalopram  10 mg Oral QHS  . famotidine  20 mg Oral QHS  . fosfomycin  3 g Oral Q72H  . furosemide  40 mg Oral BID  . gabapentin  300 mg Oral Q6H  . hydrocortisone cream   Topical BID  . lactobacillus acidophilus & bulgar  2 tablet Oral TID WC  . levothyroxine  75 mcg Oral Q0600  . linaclotide  145 mcg Oral QAC breakfast  . melatonin  5 mg Oral QHS  . multivitamin with minerals  1 tablet Oral Daily  . omega-3 acid ethyl esters  1 g Oral Daily  . pantoprazole  40 mg Oral Daily  . polyethylene glycol  17 g Oral BID  . potassium chloride SA  20 mEq Oral BID  . saccharomyces boulardii  250 mg Oral BID  . simvastatin  10 mg Oral QPM  . tamsulosin  0.4 mg Oral q AM  . warfarin  5 mg Oral ONCE-1600  . Warfarin - Pharmacist Dosing Inpatient   Does not apply q1600   Continuous Infusions:    LOS: 3 days    Time spent: over 30 min    Fayrene Helper, MD [facility]   To contact the attending provider between 7A-7P or the covering provider during after hours 7P-7A, please log into the web site [email or web address] and access using universal [place] password for that web site. If you do not have the password, please call the hospital operator.  01/07/2021, 4:30 PM

## 2021-01-07 NOTE — Care Management Important Message (Signed)
Important Message  Patient Details IM Letter given to the Patient. Name: Toni [name] MRN: [id number] Date of Birth: 29-Dec-1945   Medicare Important Message Given:  Yes     Kerin Salen 01/07/2021, 10:28 AM

## 2021-01-07 NOTE — Progress Notes (Signed)
Called re pt's ITB pump, admitted for possible UTI but also having progressive increase in spasticity as an outpatient. She had a mild fever but no severe hyper-pyrexia / HTN, no worsening of pulmonary status. Pump fill notes over the past few months by Dr. [name] show higher than expected residuals on refill. Last fill shows an alarm / near empty date of 02/20/21. Clinical picture sounds inconsistent with acute / severe complete baclofen withdrawal, but the worsening spasms and increased residuals are somewhat concerning for either pump malfunction or system malfunction. I reviewed her most recent CT as well as op note. Pump details below for future reference / MRI compatibility. On the CTA chest, the pump system is visible without any obvious catheter disconnection. 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 She does have prior cervical hardware in place but unable to fully visualize it on this study. No obvious pump granuloma but not an ideal study for this and she only has ITB fills, no opiate fills recorded. She has been started on oral baclofen already as an outpatient.   -I would recommend getting an MRI of the cervical and thoracic spine without contrast to make sure she does not have a progressive structural cause. The pump will turn off while she's in the scanner, so it's probably best to do this while she's inpatient so she can be monitored afterwards.   -after MRI, she will need the pump interrogated to make sure it turns back on and restarts therapy. Unfortunately, I do not have the contact information for our rep to get an interrogator and I do not have one. If the patient or their family don't have a rep contact info on their device card, will have to call [employer], who will be able to put you in contact with the local rep. Dr. [name]' office also may have this  information. Pump information is below for MR compatibility / etc in case the patient does not have her device card with her.   -if there is any concern she has systemic signs of acute / severe baclofen withdrawal (high fever, severe rigidity, refractory HTN, mental status changes, Sz, etc.) please let me or the on call neurosurgeon know immediately.  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

## 2021-01-07 NOTE — Progress Notes (Signed)
ANTICOAGULATION CONSULT NOTE - follow up  Pharmacy Consult for warfarin Indication: atrial fibrillation  Allergies  Allergen Reactions  . Other Rash    Topical Surgical prep  - severe rash  . Chlorhexidine Hives and Other (See Comments)    And, sores appear where applied  . Hydrocodone Nausea And Vomiting and Other (See Comments)    Hydrocodone causes vomiting Hydrocodone causes vomiting  . Oxycodone Nausea And Vomiting and Other (See Comments)    Oral oxycodone causes vomiting  . Sertraline Hives, Swelling, Rash and Other (See Comments)    [DERM][OTHER]RASH WITH SWELLING    Patient Measurements: Height: 5\' 2"  (157.5 cm) Weight: 83 kg (182 lb 15.7 oz) IBW/kg (Calculated) : 50.1   Vital Signs: Temp: 98.3 F (36.8 C) (05/20 0604) Temp Source: Oral (05/20 0604) BP: 104/59 (05/20 0604) Pulse Rate: 60 (05/20 0604)  Labs: Recent Labs    01/05/21 0329 01/06/21 0328 01/07/21 0332  HGB 12.5 12.2 12.6  HCT 37.3 36.8 38.5  PLT 163 169 173  LABPROT 24.3* 22.9* 25.5*  INR 2.2* 2.0* 2.3*  CREATININE 0.48 0.60 0.52    Estimated Creatinine Clearance: 62.6 mL/min (by C-G formula based on SCr of 0.52 mg/dL).   Medical History: Past Medical History:  Diagnosis Date  . Abnormality of gait 03/01/2015  . Anxiety   . Arthritis    "knees" (01/19/2016)  . Asthma    as a child   . Back pain   . Bilateral carpal tunnel syndrome 10/04/2020  . Cancer ([place])    / RENAL CELL CARCINOMA - GETS CT SCAN EVERY 6 MONTHS TO WATCH   . CHF (congestive heart failure) ([place])   . Chronic pain    "nerve pain from the MS"  . Complex atypical endometrial hyperplasia   . Constipation   . Depression   . Diabetes mellitus without complication ([place])   . DVT (deep venous thrombosis) ([place]) 1983   "RLE; may have just been phlebitis; it was before the age of dopplers"  . Dysrhythmia    afib   . 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 GERD  (gastroesophageal reflux disease)   . HLD (hyperlipidemia)   . Hypertension   . Hypotension   . Hypothyroidism   . Joint pain   . Multiple sclerosis ([place])    has had this may 1989-[place]oh[place] reg doc  . OSA on CPAP    bipap machine - SLEEP APNEA WORSE SINCE COVID IN 1/21 PER PATIENT SETTING HAS INCREASED FROM 9 TO 30   . Osteoarthritis   . PAF (paroxysmal atrial fibrillation) ([place])    on coumadin; documented on monitor 06/2010  . Pneumonia 11/2011  . Sleep apnea     Assessment: Pt is a 81 yoF who takes warfarin PTA for atrial fibrillation. Pharmacy consulted to dose warfarin inpatient.   Home dose: 2.5mg  on Mondays and Thursdays, 5mg  all other days. Last dose PTA: 5/16  Today, 01/07/21  INR = 2.3 remains therapeutic  CBC: Stable, WNL  No reported bleeding  Diet: Heart healthy; meal intake not charted  DDI: None significant  Goal of Therapy:  INR 2-3  Plan:   Warfarin 5 mg PO once this evening  Daily INR  At this time, would recommend continuation of home dose at discharge.   Lenis Noon, PharmD 01/07/21 10:04 AM

## 2021-01-07 NOTE — TOC Progression Note (Addendum)
Transition of Care [facility]) - Progression Note    Patient Details  Name: Toni [name] MRN: [id number] Date of Birth: Jul 09, 1946  Transition of [facility]) CM/SW Contact  Ross Ludwig, [place] Phone Number: 01/07/2021, 5:07 PM  Clinical Narrative:     CSW spoke with patient's daughter [name] [phone], and informed her that [facility] SNF can accept patient on Monday if she is medically ready for discharge.  CSW updated [facility] that per physician, patient may be ready on Monday.  Patient will need a new Covid test prior to discharge, CSW to continue to follow patient's progress throughout discharge planning.    Expected Discharge Plan: Skilled Nursing Facility Barriers to Discharge: No Barriers Identified  Expected Discharge Plan and Services Expected Discharge Plan: [place] Choice: [place] Living arrangements for the past 2 months: Single Family Home                                       Social Determinants of Health (SDOH) Interventions    Readmission Risk Interventions Readmission Risk Prevention Plan 06/11/2020  Transportation Screening Complete  PCP or Specialist appointment within 3-5 days of discharge Not Complete  PCP/Specialist Appt Not Complete comments MD office reports they have to call the pt and schedule this appt themselves when they receive notice of [place] or Home Care Consult Complete  SW Recovery Care/Counseling Consult Complete  [place] Not Applicable  Some recent data might be hidden

## 2021-01-08 DIAGNOSIS — N3000 Acute cystitis without hematuria: Secondary | ICD-10-CM | POA: Diagnosis not present

## 2021-01-08 LAB — CBC WITH DIFFERENTIAL/PLATELET
Abs Immature Granulocytes: 0.02 10*3/uL (ref 0.00–0.07)
Basophils Absolute: 0 10*3/uL (ref 0.0–0.1)
Basophils Relative: 1 %
Eosinophils Absolute: 0.5 10*3/uL (ref 0.0–0.5)
Eosinophils Relative: 7 %
HCT: 37.7 % (ref 36.0–46.0)
Hemoglobin: 12.4 g/dL (ref 12.0–15.0)
Immature Granulocytes: 0 %
Lymphocytes Relative: 30 %
Lymphs Abs: 2.3 10*3/uL (ref 0.7–4.0)
MCH: 31.2 pg (ref 26.0–34.0)
MCHC: 32.9 g/dL (ref 30.0–36.0)
MCV: 95 fL (ref 80.0–100.0)
Monocytes Absolute: 0.9 10*3/uL (ref 0.1–1.0)
Monocytes Relative: 12 %
Neutro Abs: 3.9 10*3/uL (ref 1.7–7.7)
Neutrophils Relative %: 50 %
Platelets: 175 10*3/uL (ref 150–400)
RBC: 3.97 MIL/uL (ref 3.87–5.11)
RDW: 12.5 % (ref 11.5–15.5)
WBC: 7.6 10*3/uL (ref 4.0–10.5)
nRBC: 0 % (ref 0.0–0.2)

## 2021-01-08 LAB — PROTIME-INR
INR: 2.5 — ABNORMAL HIGH (ref 0.8–1.2)
Prothrombin Time: 26.7 seconds — ABNORMAL HIGH (ref 11.4–15.2)

## 2021-01-08 LAB — COMPREHENSIVE METABOLIC PANEL
ALT: 15 U/L (ref 0–44)
AST: 19 U/L (ref 15–41)
Albumin: 3.3 g/dL — ABNORMAL LOW (ref 3.5–5.0)
Alkaline Phosphatase: 52 U/L (ref 38–126)
Anion gap: 7 (ref 5–15)
BUN: 17 mg/dL (ref 8–23)
CO2: 34 mmol/L — ABNORMAL HIGH (ref 22–32)
Calcium: 8.9 mg/dL (ref 8.9–10.3)
Chloride: 99 mmol/L (ref 98–111)
Creatinine, Ser: 0.5 mg/dL (ref 0.44–1.00)
GFR, Estimated: >60 mL/min (ref >60)
Glucose, Bld: 85 mg/dL (ref 70–99)
Potassium: 4.1 mmol/L (ref 3.5–5.1)
Sodium: 140 mmol/L (ref 135–145)
Total Bilirubin: 0.5 mg/dL (ref 0.3–1.2)
Total Protein: 5.6 g/dL — ABNORMAL LOW (ref 6.5–8.1)

## 2021-01-08 LAB — PHOSPHORUS: Phosphorus: 4.1 mg/dL (ref 2.5–4.6)

## 2021-01-08 LAB — MAGNESIUM: Magnesium: 2.1 mg/dL (ref 1.7–2.4)

## 2021-01-08 MED ORDER — WARFARIN SODIUM 5 MG PO TABS
5.0000 mg | ORAL_TABLET | ORAL | Status: DC
  Administered 2021-01-08 – 2021-01-09 (×2): 5 mg via ORAL
  Filled 2021-01-08 (×3): qty 1

## 2021-01-08 MED ORDER — WARFARIN SODIUM 2.5 MG PO TABS: 2.5000 mg | ORAL_TABLET | ORAL | Status: DC

## 2021-01-08 NOTE — Progress Notes (Signed)
Order to discontinue foley catheter. Patient is not open to pure wick being placed at this time. Pt made aware that bed pads would be placed should incontinence occur. The pt request to have a brief. Pt made aware that briefs are not available on par. Pt refused at this time. Discussed risks of continuing foley catheter. Pt request to have foley removed between 8 and 9 pm.

## 2021-01-08 NOTE — Progress Notes (Signed)
Am assessment from 0810  Documented at 1700 couumn. Assessment comp,eted at 0810.

## 2021-01-08 NOTE — Progress Notes (Signed)
ANTICOAGULATION CONSULT NOTE - follow up  Pharmacy Consult for warfarin Indication: atrial fibrillation  Allergies  Allergen Reactions  . Other Rash    Topical Surgical prep  - severe rash  . Chlorhexidine Hives and Other (See Comments)    And, sores appear where applied  . Hydrocodone Nausea And Vomiting and Other (See Comments)    Hydrocodone causes vomiting Hydrocodone causes vomiting  . Oxycodone Nausea And Vomiting and Other (See Comments)    Oral oxycodone causes vomiting  . Sertraline Hives, Swelling, Rash and Other (See Comments)    [DERM][OTHER]RASH WITH SWELLING    Patient Measurements: Height: 5\' 2"  (157.5 cm) Weight: 83 kg (182 lb 15.7 oz) IBW/kg (Calculated) : 50.1   Vital Signs: Temp: 98.2 F (36.8 C) (05/21 0934) Temp Source: Oral (05/21 0934) BP: 103/59 (05/21 0933) Pulse Rate: 71 (05/21 0933)  Labs: Recent Labs    01/06/21 0328 01/07/21 0332 01/08/21 0352  HGB 12.2 12.6 12.4  HCT 36.8 38.5 37.7  PLT 169 173 175  LABPROT 22.9* 25.5* 26.7*  INR 2.0* 2.3* 2.5*  CREATININE 0.60 0.52 0.50    Estimated Creatinine Clearance: 62.6 mL/min (by C-G formula based on SCr of 0.5 mg/dL).   Medical History: Past Medical History:  Diagnosis Date  . Abnormality of gait 03/01/2015  . Anxiety   . Arthritis    "knees" (01/19/2016)  . Asthma    as a child   . Back pain   . Bilateral carpal tunnel syndrome 10/04/2020  . Cancer ([place])    / RENAL CELL CARCINOMA - GETS CT SCAN EVERY 6 MONTHS TO WATCH   . CHF (congestive heart failure) ([place])   . Chronic pain    "nerve pain from the MS"  . Complex atypical endometrial hyperplasia   . Constipation   . Depression   . Diabetes mellitus without complication ([place])   . DVT (deep venous thrombosis) ([place]) 1983   "RLE; may have just been phlebitis; it was before the age of dopplers"  . Dysrhythmia    afib   . 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 GERD  (gastroesophageal reflux disease)   . HLD (hyperlipidemia)   . Hypertension   . Hypotension   . Hypothyroidism   . Joint pain   . Multiple sclerosis ([place])    has had this may 1989-[place]me[place] reg doc  . OSA on CPAP    bipap machine - SLEEP APNEA WORSE SINCE COVID IN 1/21 PER PATIENT SETTING HAS INCREASED FROM 9 TO 30   . Osteoarthritis   . PAF (paroxysmal atrial fibrillation) ([place])    on coumadin; documented on monitor 06/2010  . Pneumonia 11/2011  . Sleep apnea     Assessment: Pt is a 65 yoF who takes warfarin PTA for atrial fibrillation. Pharmacy consulted to dose warfarin inpatient.   Home dose: 2.5mg  on Mondays and Thursdays, 5mg  all other days. Last dose PTA: 5/16  Today, 01/08/21  INR = 2.5 remains therapeutic  CBC: Stable, WNL  No bleeding reported  Diet: Heart healthy; meal intake not charted  DDI: None significant  Goal of Therapy:  INR 2-3  Plan:   Resume home dose of warfarin 5 mg daily except 2.5 mg on Monday & Thursday  Check INR q48h hours since INR has been therapeutic throughout admission and no significant DDI  At this time, would recommend continuation of home dose at discharge.  Lenis Noon, PharmD 01/08/21 10:55 AM

## 2021-01-08 NOTE — Progress Notes (Signed)
PROGRESS NOTE    Toni [name]  VOJ:[id number] DOB: 1946/02/08 DOA: 01/03/2021 PCP: Prince Solian, MD   Chief Complaint  Patient presents with  . foul urine  . rectal discharge    Brief Narrative:  Toni [name] 75 y.o.femalewithhistory of multiple sclerosis largely bedbound, Toni [name]. 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  She was recently seen by her neurologist and her baclofen pump was adjusted for Toni [name] higher dose and also refilled (11/30/20).  She has Toni [name] history of recurrent UTIs and follows with urology and is being evaluated currently for possible suprapubic catheter placement. She has been on multiple antibiotic courses and most recently states that she was started on chronic suppressive antibiotic therapy (she thinks Keflex).  She was admitted for her bladder spasms and concern for recurrent UTI.  She was started on Rocephin on admission.  IV Ativan has worked for her ongoing bladder spasms.  Her case was also discussed with neuro on admission (see H&P; per Dr. Leonie Man, if no improvement in spasms after UTI treatment then neuro eval can be pursued).   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 Vs baclofen pump malfunction? - appreciate neurosurgery assistance - plan for now is to treat UTI 2-3 days; use PRN ativan for now; if  no improvement then neuro consult to further assist; not sure if needs urology input (not yet), but possibly the indwelling foley which is new as of ~3 weeks ago may be causing irritation as well (this was exchanged on admission also)  MS  Worsening muscle spasms - home med on hold - concern for baclofen pum malfunction - appreciate neurosurgery recommendations -> follow MRI C/T spine (awaiting this study), pump will need to be interrogated after MRI - if concern for baclofen withdrawal call back - continue xanax and ativan (PRN; the xanax is [name] home med) - continue PRN baclofen, gaba, baclofen pump  Enterococcus Faecalis and Pseudomonas UTI  Indwelling Foley Catheter - transition to fosfomycin which should cover both, discussed with pharmacy - follow up culture (pansensitive enterococcus, pseudomonas sensitive to ceftaz, imipenem, zosyn) - foley placed to allow Toni [name] to heal it sounds like, will d/c indwelling foley  Constipation - continue current regimen  Afib - continue diltiazem for now; hold parameters entered - diltiazem decreased to 120 mg daily, follow HR - continue coumadin per pharmacy  Central sleep apnea - continue BiPAP  Depression - Continue Lexapro and Wellbutrin  Intertrigo - trial of antifungal and steroid cream given pruritis (she notes this combination helped outpatient) - follow  DVT prophylaxis: warfarin Code Status: full  Family Communication: daughter at bedside Disposition:   Status is: Inpatient  Remains inpatient appropriate because:Inpatient level of care appropriate due to severity of illness   Dispo: The patient is from: Home              Anticipated d/c is to: Home  Patient currently is not medically stable to d/c.   Difficult to place patient No       Consultants:   none  Procedures:  none  Antimicrobials: Anti-infectives (From admission, onward)   Start     Dose/Rate Route Frequency Ordered Stop    01/06/21 1200  fosfomycin (MONUROL) packet 3 g        3 g Oral every 72 hours 01/06/21 1052 01/12/21 1159   01/05/21 1215  ampicillin (OMNIPEN) 1 g in sodium chloride 0.9 % 100 mL IVPB  Status:  Discontinued        1 g 300 mL/hr over 20 Minutes Intravenous Every 6 hours 01/05/21 1117 01/06/21 1051   01/03/21 1845  cefTRIAXone (ROCEPHIN) 1 g in sodium chloride 0.9 % 100 mL IVPB  Status:  Discontinued        1 g 200 mL/hr over 30 Minutes Intravenous Every 24 hours 01/03/21 1837 01/05/21 1117         Subjective: Dysuria, continued spasms  Objective: Vitals:   01/08/21 0506 01/08/21 0933 01/08/21 0934 01/08/21 1141  BP: (!) 114/59 (!) 103/59  (!) 108/51  Pulse: (!) 59 71  72  Resp: 20 18  16   Temp: 98 F (36.7 C)  98.2 F (36.8 C) 97.7 F (36.5 C)  TempSrc: Oral  Oral Oral  SpO2: 100% 100%  100%  Weight:      Height:        Intake/Output Summary (Last 24 hours) at 01/08/2021 1508 Last data filed at 01/08/2021 1120 Gross per 24 hour  Intake --  Output 2620 ml  Net -2620 ml   Filed Weights   01/03/21 1614  Weight: 83 kg    Examination:  General: No acute distress. Cardiovascular: Heart sounds show Khamani Fairley regular rate, and rhythm Lungs: Clear to auscultation bilaterally  Abdomen: Soft, nontender, nondistended Neurological: Alert and oriented 3. Moves all extremities 4. Cranial nerves II through XII grossly intact. Skin: Warm and dry. No rashes or lesions. Extremities: No clubbing or cyanosis. No edema.   Data Reviewed: I have personally reviewed following labs and imaging studies  CBC: Recent Labs  Lab 01/03/21 1600 01/04/21 0351 01/05/21 0329 01/06/21 0328 01/07/21 0332 01/08/21 0352  WBC 8.9 8.8 7.2 8.4 8.6 7.6  NEUTROABS 5.2  --  2.9 3.8 4.6 3.9  HGB 14.3 12.8 12.5 12.2 12.6 12.4  HCT 43.8 38.8 37.3 36.8 38.5 37.7  MCV 95.0 95.6 94.2 94.6 96.3 95.0  PLT 185 163 163 169 173 347    Basic Metabolic Panel: Recent Labs  Lab 01/04/21 0351 01/05/21 0329  01/06/21 0328 01/07/21 0332 01/08/21 0352  NA 140 139 139 140 140  K 3.5 3.8 3.8 4.1 4.1  CL 100 99 101 101 99  CO2 33* 33* 35* 35* 34*  GLUCOSE 80 93 92 97 85  BUN 23 21 20 19 17   CREATININE 0.56 0.48 0.60 0.52 0.50  CALCIUM 9.0 8.8* 8.6* 8.8* 8.9  MG  --  2.0 2.0 2.0 2.1  PHOS  --   --   --  3.9 4.1    GFR: Estimated Creatinine Clearance: 62.6 mL/min (by C-G formula based on SCr of 0.5 mg/dL).  Liver Function Tests: Recent Labs  Lab 01/03/21 1600 01/06/21 0328 01/07/21 0332 01/08/21 0352  AST 19 15 18 19   ALT 15 15 17 15   ALKPHOS 57 48 54 52  BILITOT 0.2* 0.6 0.5 0.5  PROT 6.7 5.6* 6.0* 5.6*  ALBUMIN 3.7 3.2* 3.4* 3.3*  CBG: No results for input(s): GLUCAP in the last 168 hours.   Recent Results (from the past 240 hour(s))  Culture, blood (Routine x 2)     Status: None (Preliminary result)   Collection Time: 01/03/21  3:21 PM   Specimen: Right Antecubital; Blood  Result Value Ref Range Status   Specimen Description   Final    RIGHT ANTECUBITAL BLOOD Performed at [facility], [address] [place], [place] [address]    Special Requests   Final    BOTTLES DRAWN AEROBIC AND ANAEROBIC Blood Culture adequate volume Performed at [place] [address], [place], [place] [address]    Culture   Final    NO GROWTH 4 DAYS Performed at [facility], [place] [address] [place], [place] [address]    Report Status PENDING  Incomplete  Culture, blood (Routine x 2)     Status: None (Preliminary result)   Collection Time: 01/03/21  3:26 PM   Specimen: BLOOD RIGHT HAND  Result Value Ref Range Status   Specimen Description   Final    BLOOD RIGHT HAND Performed at [place] [address] [place], [place] [address]    Special Requests   Final    BOTTLES DRAWN AEROBIC AND ANAEROBIC Blood Culture adequate volume Performed at [place] [address] [place], [place] [address]     Culture   Final    NO GROWTH 4 DAYS Performed at [facility], [place] [address], [place], [place] [address]    Report Status PENDING  Incomplete  Culture, Urine     Status: Abnormal   Collection Time: 01/03/21  4:00 PM   Specimen: Urine, Random  Result Value Ref Range Status   Specimen Description   Final    URINE, RANDOM Performed at [place] [address], [place],  [address]    Special Requests   Final    NONE Performed at [facility], [place] [address], [place],  [address]    Culture ([place])  Final    >=100,000 COLONIES/mL ENTEROCOCCUS FAECALIS 20,000 COLONIES/mL PSEUDOMONAS AERUGINOSA    Report Status 01/07/2021 FINAL  Final   Organism ID, Bacteria ENTEROCOCCUS FAECALIS (Trentin Knappenberger)  Final   Organism ID, Bacteria PSEUDOMONAS AERUGINOSA ([place] Hollar)  Final      Susceptibility   Enterococcus faecalis - MIC*    AMPICILLIN <=2 SENSITIVE Sensitive     NITROFURANTOIN <=16 SENSITIVE Sensitive     VANCOMYCIN 1 SENSITIVE Sensitive     * >=100,000 COLONIES/mL ENTEROCOCCUS FAECALIS   Pseudomonas aeruginosa - MIC*    CEFTAZIDIME 8 SENSITIVE Sensitive     CIPROFLOXACIN 2 INTERMEDIATE Intermediate     GENTAMICIN 8 INTERMEDIATE Intermediate     IMIPENEM 2 SENSITIVE Sensitive     PIP/TAZO 16 SENSITIVE Sensitive     * 20,000 COLONIES/mL PSEUDOMONAS AERUGINOSA  SARS CORONAVIRUS 2 (TAT 6-24 HRS) Nasopharyngeal Nasopharyngeal Swab     Status: None   Collection Time: 01/03/21  8:57 PM   Specimen: Nasopharyngeal Swab  Result Value Ref Range Status   SARS Coronavirus 2 NEGATIVE NEGATIVE Final    Comment: (NOTE) SARS-CoV-2 target nucleic acids are NOT DETECTED.  The SARS-CoV-2 RNA is generally detectable in upper and lower respiratory specimens during the acute phase of infection. Negative results do not preclude SARS-CoV-2 infection, do not rule out co-infections with other pathogens, and should not be used as the sole basis for treatment  or other  patient management decisions. Negative results must be combined with clinical observations, patient history, and epidemiological information. The expected result is Negative.  Fact Sheet for Patients: SugarRoll.be  Fact Sheet for Healthcare Providers: https://www.woods-mathews.com/  This test is not yet approved or cleared by the [place] FDA and  has been authorized for detection and/or diagnosis of SARS-CoV-2 by FDA under an Emergency Use Authorization (EUA). This EUA will remain  in effect (meaning this test can be used) for the duration of the COVID-19 declaration under Se ction 564(b)(1) of the Act, 21 U.S.C. section 360bbb-3(b)(1), unless the authorization is terminated or revoked sooner.  Performed at [facility], [place] [address], [place], [place] [address]          Radiology Studies: No results found.      Scheduled Meds: . buPROPion  150 mg Oral Daily  . calcium-vitamin D  1 tablet Oral Q breakfast  . cholecalciferol  2,000 Units Oral Daily  . clotrimazole   Topical BID  . diltiazem  120 mg Oral Daily  . escitalopram  10 mg Oral QHS  . famotidine  20 mg Oral QHS  . fosfomycin  3 g Oral Q72H  . furosemide  40 mg Oral BID  . gabapentin  300 mg Oral Q6H  . hydrocortisone cream   Topical BID  . lactobacillus acidophilus & bulgar  2 tablet Oral TID WC  . levothyroxine  75 mcg Oral Q0600  . linaclotide  145 mcg Oral QAC breakfast  . melatonin  5 mg Oral QHS  . multivitamin with minerals  1 tablet Oral Daily  . omega-3 acid ethyl esters  1 g Oral Daily  . pantoprazole  40 mg Oral Daily  . phenazopyridine  200 mg Oral TID WC  . polyethylene glycol  17 g Oral BID  . potassium chloride SA  20 mEq Oral BID  . saccharomyces boulardii  250 mg Oral BID  . simvastatin  10 mg Oral QPM  . tamsulosin  0.4 mg Oral q AM  . [START ON 01/10/2021] warfarin  2.5 mg Oral Once per day on Mon Thu  . warfarin  5 mg Oral Once  per day on Sun Tue Wed Fri Sat  . Warfarin - Pharmacist Dosing Inpatient   Does not apply q1600   Continuous Infusions:    LOS: 4 days    Time spent: over 30 min    Fayrene Helper, MD [facility]   To contact the attending provider between 7A-7P or the covering provider during after hours 7P-7A, please log into the web site [email or web address] and access using universal [place] password for that web site. If you do not have the password, please call the hospital operator.  01/08/2021, 3:08 PM

## 2021-01-09 ENCOUNTER — Encounter (HOSPITAL_COMMUNITY): Payer: Self-pay | Admitting: Internal Medicine

## 2021-01-09 DIAGNOSIS — N3000 Acute cystitis without hematuria: Secondary | ICD-10-CM | POA: Diagnosis not present

## 2021-01-09 LAB — CULTURE, BLOOD (ROUTINE X 2)
Culture: NO GROWTH
Culture: NO GROWTH
Special Requests: ADEQUATE
Special Requests: ADEQUATE

## 2021-01-09 NOTE — Progress Notes (Signed)
Pt with continued refusal to remove Foley. Patient stated " I didn't agree to remove the foley at 2000 or 2100" . Pt stated " I agreed to take my Lasix pill at 2000 or 2100".  Pt reiterated " I  requested to talk with the doctor about not removing the foley before you got here". Husband at Bedside in agreement.

## 2021-01-09 NOTE — Plan of Care (Signed)

## 2021-01-09 NOTE — Progress Notes (Signed)
PROGRESS NOTE    Toni [name]  MWU:[id number] DOB: Oct 12, 1945 DOA: 01/03/2021 PCP: Prince Solian, MD   Chief Complaint  Patient presents with  . foul urine  . rectal discharge    Brief Narrative:  Toni [name] Toni [name] 75 y.o.femalewithhistory of multiple sclerosis largely bedbound, Toni [name]. 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  She was recently seen by her neurologist and her baclofen pump was adjusted for [name] higher dose and also refilled (11/30/20).  She has [name] history of recurrent UTIs and follows with urology and is being evaluated currently for possible suprapubic catheter placement. She has been on multiple antibiotic courses and most recently states that she was started on chronic suppressive antibiotic therapy (she thinks Keflex).  She was admitted for her bladder spasms and concern for recurrent UTI.  She was started on Rocephin on admission.  IV Ativan has worked for her ongoing bladder spasms.  Her case was also discussed with neuro on admission (see H&P; per Dr. Leonie Man, if no improvement in spasms after UTI treatment then neuro eval can be pursued).   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 Vs baclofen pump malfunction? - appreciate neurosurgery assistance - plan for now is to treat UTI 2-3 days; use PRN ativan for now; if  no improvement then neuro consult to further assist; not sure if needs urology input (not yet), but possibly the indwelling foley which is new as of ~3 weeks ago may be causing irritation as well (this was exchanged on admission also)  MS  Worsening muscle spasms - home med on hold - concern for baclofen pum malfunction - appreciate neurosurgery recommendations -> follow MRI C/T spine (awaiting this study), pump will need to be interrogated after MRI - if concern for baclofen withdrawal call back - continue xanax and ativan (PRN; the xanax is [name] home med) - continue PRN baclofen, gaba, baclofen pump  Enterococcus Faecalis and Pseudomonas UTI  Indwelling Foley Catheter - transition to fosfomycin which should cover both, discussed with pharmacy - follow up culture (pansensitive enterococcus, pseudomonas sensitive to ceftaz, imipenem, zosyn) - foley placed to allow labial wound to heal it sounds like, will d/c indwelling foley (will discuss plan for after foley removed with RN - pads/purewick?)  Constipation - continue current regimen  Afib - continue diltiazem for now; hold parameters entered - diltiazem decreased to 120 mg daily, follow HR - continue coumadin per pharmacy  Central sleep apnea - continue BiPAP  Depression - Continue Lexapro and Wellbutrin  Intertrigo - trial of antifungal and steroid cream given pruritis (she notes this combination helped outpatient) - follow  DVT prophylaxis: warfarin Code Status: full  Family Communication: daughter at bedside Disposition:   Status is: Inpatient  Remains inpatient appropriate because:Inpatient level of care appropriate due to severity of illness   Dispo: The patient is from: Home  Anticipated d/c is to: Home              Patient currently is not medically stable to d/c.   Difficult to place patient No       Consultants:   none  Procedures:  none  Antimicrobials: Anti-infectives (From  admission, onward)   Start     Dose/Rate Route Frequency Ordered Stop   01/06/21 1200  fosfomycin (MONUROL) packet 3 g        3 g Oral every 72 hours 01/06/21 1052 01/09/21 1107   01/05/21 1215  ampicillin (OMNIPEN) 1 g in sodium chloride 0.9 % 100 mL IVPB  Status:  Discontinued        1 g 300 mL/hr over 20 Minutes Intravenous Every 6 hours 01/05/21 1117 01/06/21 1051   01/03/21 1845  cefTRIAXone (ROCEPHIN) 1 g in sodium chloride 0.9 % 100 mL IVPB  Status:  Discontinued        1 g 200 mL/hr over 30 Minutes Intravenous Every 24 hours 01/03/21 1837 01/05/21 1117         Subjective: Didn't want foley out last night - really wanted to use depends  Objective: Vitals:   01/08/21 2040 01/08/21 2128 01/09/21 0638 01/09/21 1415  BP: 108/63  (!) 111/58 117/60  Pulse: 87  (!) 55 75  Resp: 20 19 20 13   Temp: 99.1 F (37.3 C)  98 F (36.7 C) 99.4 F (37.4 C)  TempSrc: Oral   Oral  SpO2: 99%  100% 100%  Weight:      Height:        Intake/Output Summary (Last 24 hours) at 01/09/2021 1656 Last data filed at 01/09/2021 1142 Gross per 24 hour  Intake 480 ml  Output 3150 ml  Net -2670 ml   Filed Weights   01/03/21 1614  Weight: 83 kg    Examination:  General: No acute distress. Cardiovascular: Heart sounds show Yandell Mcjunkins regular rate, and rhythm. Lungs: Clear to auscultation bilaterally  Abdomen: Soft, nontender, nondistended  Neurological: Alert and oriented 3. Moves all extremities 4 . Cranial nerves II through XII grossly intact. Skin: Warm and dry. No rashes or lesions. Extremities: No clubbing or cyanosis. No edema  Data Reviewed: I have personally reviewed following labs and imaging studies  CBC: Recent Labs  Lab 01/03/21 1600 01/04/21 0351 01/05/21 0329 01/06/21 0328 01/07/21 0332 01/08/21 0352  WBC 8.9 8.8 7.2 8.4 8.6 7.6  NEUTROABS 5.2  --  2.9 3.8 4.6 3.9  HGB 14.3 12.8 12.5 12.2 12.6 12.4  HCT 43.8 38.8 37.3 36.8 38.5 37.7  MCV 95.0 95.6 94.2 94.6 96.3 95.0   PLT 185 163 163 169 173 564    Basic Metabolic Panel: Recent Labs  Lab 01/04/21 0351 01/05/21 0329 01/06/21 0328 01/07/21 0332 01/08/21 0352  NA 140 139 139 140 140  K 3.5 3.8 3.8 4.1 4.1  CL 100 99 101 101 99  CO2 33* 33* 35* 35* 34*  GLUCOSE 80 93 92 97 85  BUN 23 21 20 19 17   CREATININE 0.56 0.48 0.60 0.52 0.50  CALCIUM 9.0 8.8* 8.6* 8.8* 8.9  MG  --  2.0 2.0 2.0 2.1  PHOS  --   --   --  3.9 4.1    GFR: Estimated Creatinine Clearance: 62.6 mL/min (by C-G formula based on SCr of 0.5 mg/dL).  Liver Function Tests: Recent Labs  Lab 01/03/21 1600 01/06/21 0328 01/07/21 0332 01/08/21 0352  AST 19 15 18 19   ALT 15  15 17 15   ALKPHOS 57 48 54 52  BILITOT 0.2* 0.6 0.5 0.5  PROT 6.7 5.6* 6.0* 5.6*  ALBUMIN 3.7 3.2* 3.4* 3.3*    CBG: No results for input(s): GLUCAP in the last 168 hours.   Recent Results (from the past 240 hour(s))  Culture, blood (Routine x 2)     Status: None (Preliminary result)   Collection Time: 01/03/21  3:21 PM   Specimen: Right Antecubital; Blood  Result Value Ref Range Status   Specimen Description   Final    RIGHT ANTECUBITAL BLOOD Performed at [facility], [address] [place], [place] [address]    Special Requests   Final    BOTTLES DRAWN AEROBIC AND ANAEROBIC Blood Culture adequate volume Performed at [facility] [address] [place], [place] [address]    Culture   Final    NO GROWTH 4 DAYS Performed at [facility], [place] [address], [place], [place] [address]    Report Status PENDING  Incomplete  Culture, blood (Routine x 2)     Status: None (Preliminary result)   Collection Time: 01/03/21  3:26 PM   Specimen: BLOOD RIGHT HAND  Result Value Ref Range Status   Specimen Description   Final    BLOOD RIGHT HAND Performed at [facility] [address] [place], [place] [address]    Special Requests   Final    BOTTLES DRAWN AEROBIC AND ANAEROBIC Blood Culture adequate  volume Performed at [facility] [address] [place], [place] [address]    Culture   Final    NO GROWTH 4 DAYS Performed at [facility], [place] [address] [place], [place] [address]    Report Status PENDING  Incomplete  Culture, Urine     Status: Abnormal   Collection Time: 01/03/21  4:00 PM   Specimen: Urine, Random  Result Value Ref Range Status   Specimen Description   Final    URINE, RANDOM Performed at [facility] [address] [place], [place] [address]    Special Requests   Final    NONE Performed at [facility], [place] [address], [place], [place] [address]    Culture (Tyde Lamison)  Final    >=100,000 COLONIES/mL ENTEROCOCCUS FAECALIS 20,000 COLONIES/mL PSEUDOMONAS AERUGINOSA    Report Status 01/07/2021 FINAL  Final   Organism ID, Bacteria ENTEROCOCCUS FAECALIS (Ajeet Casasola)  Final   Organism ID, Bacteria PSEUDOMONAS AERUGINOSA (Dimitrios Balestrieri)  Final      Susceptibility   Enterococcus faecalis - MIC*    AMPICILLIN <=2 SENSITIVE Sensitive     NITROFURANTOIN <=16 SENSITIVE Sensitive     VANCOMYCIN 1 SENSITIVE Sensitive     * >=100,000 COLONIES/mL ENTEROCOCCUS FAECALIS   Pseudomonas aeruginosa - MIC*    CEFTAZIDIME 8 SENSITIVE Sensitive     CIPROFLOXACIN 2 INTERMEDIATE Intermediate     GENTAMICIN 8 INTERMEDIATE Intermediate     IMIPENEM 2 SENSITIVE Sensitive     PIP/TAZO 16 SENSITIVE Sensitive     * 20,000 COLONIES/mL PSEUDOMONAS AERUGINOSA  SARS CORONAVIRUS 2 (TAT 6-24 HRS) Nasopharyngeal Nasopharyngeal Swab     Status: None   Collection Time: 01/03/21  8:57 PM   Specimen: Nasopharyngeal Swab  Result Value Ref Range Status   SARS Coronavirus 2 NEGATIVE NEGATIVE Final    Comment: (NOTE) SARS-CoV-2 target nucleic acids are NOT DETECTED.  The SARS-CoV-2 RNA is generally detectable in upper and lower respiratory specimens during the  acute phase of infection. Negative results do not preclude SARS-CoV-2 infection, do not rule  out co-infections with other pathogens, and should not be used as the sole basis for treatment or other patient management decisions. Negative results must be combined with clinical observations, patient history, and epidemiological information. The expected result is Negative.  Fact Sheet for Patients: SugarRoll.be  Fact Sheet for Healthcare Providers: https://www.woods-mathews.com/  This test is not yet approved or cleared by the [place] FDA and  has been authorized for detection and/or diagnosis of SARS-CoV-2 by FDA under an Emergency Use Authorization (EUA). This EUA will remain  in effect (meaning this test can be used) for the duration of the COVID-19 declaration under Se ction 564(b)(1) of the Act, 21 U.S.C. section 360bbb-3(b)(1), unless the authorization is terminated or revoked sooner.  Performed at [facility], [place] [address], [place], [place] [address]          Radiology Studies: No results found.      Scheduled Meds: . buPROPion  150 mg Oral Daily  . calcium-vitamin D  1 tablet Oral Q breakfast  . cholecalciferol  2,000 Units Oral Daily  . clotrimazole   Topical BID  . diltiazem  120 mg Oral Daily  . escitalopram  10 mg Oral QHS  . famotidine  20 mg Oral QHS  . furosemide  40 mg Oral BID  . gabapentin  300 mg Oral Q6H  . hydrocortisone cream   Topical BID  . lactobacillus acidophilus & bulgar  2 tablet Oral TID WC  . levothyroxine  75 mcg Oral Q0600  . linaclotide  145 mcg Oral QAC breakfast  . melatonin  5 mg Oral QHS  . multivitamin with minerals  1 tablet Oral Daily  . omega-3 acid ethyl esters  1 g Oral Daily  . pantoprazole  40 mg Oral Daily  . polyethylene glycol  17 g Oral BID  . potassium chloride SA  20 mEq Oral BID  . saccharomyces boulardii  250 mg Oral BID  . simvastatin  10 mg Oral QPM  . tamsulosin  0.4 mg Oral q AM  . [START ON 01/10/2021] warfarin  2.5 mg Oral Once per day on  Mon Thu  . warfarin  5 mg Oral Once per day on Sun Tue Wed Fri Sat  . Warfarin - Pharmacist Dosing Inpatient   Does not apply q1600   Continuous Infusions:    LOS: 5 days    Time spent: over 30 min    Fayrene Helper, MD [facility]   To contact the attending provider between 7A-7P or the covering provider during after hours 7P-7A, please log into the web site [email or web address] and access using universal [place] password for that web site. If you do not have the password, please call the hospital operator.  01/09/2021, 4:56 PM

## 2021-01-09 NOTE — Progress Notes (Signed)
Placed patient on home CPAP unit. Added distilled H2O to chamber per patient request. 5L O2 bleed in per pt.

## 2021-01-09 NOTE — Progress Notes (Signed)
Called MRI to check status of MRI schedule. MRI states they are waiting on a response from MD to schedule the MRI. They are saying [employer] needs to be called so they can come interrogate the baclofen pump immediately afterward, [phone]. This call can be made tomorrow and then MRI needs to be advised of what time the rep can come, or whether they need to come at all, so that MRI can work her into the schedule.  I will pass this along in report tonight and have informed Dr. Florene Glen and the charge RN as well. Coolidge Breeze, RN 01/09/2021

## 2021-01-10 ENCOUNTER — Inpatient Hospital Stay (HOSPITAL_COMMUNITY): Payer: Medicare Other

## 2021-01-10 DIAGNOSIS — N3000 Acute cystitis without hematuria: Secondary | ICD-10-CM | POA: Diagnosis not present

## 2021-01-10 LAB — COMPREHENSIVE METABOLIC PANEL
ALT: 15 U/L (ref 0–44)
AST: 16 U/L (ref 15–41)
Albumin: 3.2 g/dL — ABNORMAL LOW (ref 3.5–5.0)
Alkaline Phosphatase: 49 U/L (ref 38–126)
Anion gap: 7 (ref 5–15)
BUN: 17 mg/dL (ref 8–23)
CO2: 34 mmol/L — ABNORMAL HIGH (ref 22–32)
Calcium: 8.6 mg/dL — ABNORMAL LOW (ref 8.9–10.3)
Chloride: 98 mmol/L (ref 98–111)
Creatinine, Ser: 0.53 mg/dL (ref 0.44–1.00)
GFR, Estimated: >60 mL/min (ref >60)
Glucose, Bld: 95 mg/dL (ref 70–99)
Potassium: 4.1 mmol/L (ref 3.5–5.1)
Sodium: 139 mmol/L (ref 135–145)
Total Bilirubin: 0.4 mg/dL (ref 0.3–1.2)
Total Protein: 5.7 g/dL — ABNORMAL LOW (ref 6.5–8.1)

## 2021-01-10 LAB — CBC WITH DIFFERENTIAL/PLATELET
Abs Immature Granulocytes: 0.01 10*3/uL (ref 0.00–0.07)
Basophils Absolute: 0.1 10*3/uL (ref 0.0–0.1)
Basophils Relative: 1 %
Eosinophils Absolute: 0.4 10*3/uL (ref 0.0–0.5)
Eosinophils Relative: 6 %
HCT: 36.3 % (ref 36.0–46.0)
Hemoglobin: 11.9 g/dL — ABNORMAL LOW (ref 12.0–15.0)
Immature Granulocytes: 0 %
Lymphocytes Relative: 37 %
Lymphs Abs: 2.7 10*3/uL (ref 0.7–4.0)
MCH: 31.4 pg (ref 26.0–34.0)
MCHC: 32.8 g/dL (ref 30.0–36.0)
MCV: 95.8 fL (ref 80.0–100.0)
Monocytes Absolute: 0.9 10*3/uL (ref 0.1–1.0)
Monocytes Relative: 12 %
Neutro Abs: 3.2 10*3/uL (ref 1.7–7.7)
Neutrophils Relative %: 44 %
Platelets: 167 10*3/uL (ref 150–400)
RBC: 3.79 MIL/uL — ABNORMAL LOW (ref 3.87–5.11)
RDW: 12.7 % (ref 11.5–15.5)
WBC: 7.2 10*3/uL (ref 4.0–10.5)
nRBC: 0 % (ref 0.0–0.2)

## 2021-01-10 LAB — PROTIME-INR
INR: 3.3 — ABNORMAL HIGH (ref 0.8–1.2)
Prothrombin Time: 33.9 seconds — ABNORMAL HIGH (ref 11.4–15.2)

## 2021-01-10 LAB — SARS CORONAVIRUS 2 (TAT 6-24 HRS): SARS Coronavirus 2: NEGATIVE

## 2021-01-10 LAB — PHOSPHORUS: Phosphorus: 3.7 mg/dL (ref 2.5–4.6)

## 2021-01-10 LAB — MAGNESIUM: Magnesium: 2.2 mg/dL (ref 1.7–2.4)

## 2021-01-10 MED ORDER — DICLOFENAC SODIUM 1 % EX GEL
2.0000 g | Freq: Four times a day (QID) | CUTANEOUS | Status: DC
  Administered 2021-01-10 – 2021-01-13 (×12): 2 g via TOPICAL
  Filled 2021-01-10 (×2): qty 100

## 2021-01-10 MED ORDER — WARFARIN SODIUM 2.5 MG PO TABS
2.5000 mg | ORAL_TABLET | Freq: Once | ORAL | Status: AC
  Administered 2021-01-10: 2.5 mg via ORAL
  Filled 2021-01-10: qty 1

## 2021-01-10 NOTE — Progress Notes (Signed)
Placed patient on home CPAP machine for the night with oxygen set at 5lpm.  

## 2021-01-10 NOTE — Progress Notes (Signed)
OT Cancellation Note  Patient Details Name: Toni [name] MRN: [id number] DOB: July 11, 1946   Cancelled Treatment:    Reason Eval/Treat Not Completed: Fatigue/lethargy limiting ability to participate: RN notified OT that pt/family has been requesting session.   OT arrived as pt recently returned from MRI and pt's lunch had arrived.  Pt stated too fatigued to work with OT.  OT offered to modify therapy to be less taxing, and work on EOB sitting while pt ate lunch however pt responded, "I think I'm going to eat lunch right here."  Pt asked for OT to return when able.   Julien Girt 01/10/2021, 1:40 PM

## 2021-01-10 NOTE — Care Management Important Message (Signed)
Important Message  Patient Details IM Letter given to the Patient. Name: Toni [name] MRN: [id number] Date of Birth: 10/15/1945   Medicare Important Message Given:  Yes     Kerin Salen 01/10/2021, 12:25 PM

## 2021-01-10 NOTE — Progress Notes (Signed)
Physical Therapy Treatment Patient Details Name: Toni [name] MRN: [id number] DOB: [birth year] Today's Date: 01/10/2021    History of Present Illness Pt is a 75 y.o. female admitted 01/03/21 for bladder spasms, blood pressure downtrending and low-grade temperature. PMH includes: Anxiety, Arthritis, Asthma, Back pain, Bilateral carpal tunnel syndrome (10/04/2020), Cancer, CHF, Chronic pain, Complex atypical endometrial hyperplasia, Depression, DM, Dysrhythmia, Edema, GERD, HLD, Hypertension, Hypotension, Hypothyroidism, Multiple sclerosis (largely bed bound), OSA on CPAP, Osteoarthritis, PAF, Recurrant UTI (indwelling cath, being considered for suprapubic catheter), and Sleep apnea.    PT Comments    Pt with improved transfers today.  Performed multiple sit to stands into STEDY with min A of 2.  Pt has limited mobility at baseline and 24 hr support.  Pt describing her exercises at home and is motivated to improve mobility. Continue plan of care and recommendation for return home.    Follow Up Recommendations  Home health PT;Supervision/Assistance - 24 hour     Equipment Recommendations  None recommended by PT    Recommendations for Other Services       Precautions / Restrictions Precautions Precautions: Fall Precaution Comments: R ankle is sprained and requires wrapping or shoe prior to WB    Mobility  Bed Mobility Overal bed mobility: Needs Assistance Bed Mobility: Supine to Sit;Rolling Rolling: Mod assist   Supine to sit: Mod assist     General bed mobility comments: Rolling both sides and then mod A to lift trunk to sit    Transfers Overall transfer level: Needs assistance Equipment used: Ambulation equipment used Transfers: Sit to/from [facility] Sit to Stand: Min assist;+2 physical assistance Stand pivot transfers: Total assist (STEDY)       General transfer comment: Performed sit to stand from bed x 1, from STEDY x 6, from bsc x 1; Had assist of 2  for sit to stand pt with good initiation requiring min A to complete stand; had diffculty completely straightening knees but was able to tuck buttock  Ambulation/Gait                 Stairs             Wheelchair Mobility    Modified Rankin (Stroke Patients Only)       Balance Overall balance assessment: Needs assistance Sitting-balance support: Feet supported;Bilateral upper extremity supported;No upper extremity supported Sitting balance-Leahy Scale: Fair Sitting balance - Comments: able to sit without UE support but prefers to hold on   Standing balance support: Bilateral upper extremity supported Standing balance-Leahy Scale: Poor Standing balance comment: Requiring bil UE support but stood in STEDY multiple times for 15-20 seconds                            Cognition Arousal/Alertness: Awake/alert Behavior During Therapy: WFL for tasks assessed/performed Overall Cognitive Status: Within Functional Limits for tasks assessed                                        Exercises General Exercises - Lower Extremity Ankle Circles/Pumps: AROM;Both;10 reps;Supine Hip Flexion/Marching: AROM;Both;Seated;10 reps    General Comments        Pertinent Vitals/Pain Pain Assessment: Faces Faces Pain Scale: Hurts a little bit Pain Location: R ankle with WB Pain Descriptors / Indicators: Discomfort Pain Intervention(s): Limited activity within patient's tolerance;Monitored during session (wrapped with ACE)  Home Living                      Prior Function            PT Goals (current goals can now be found in the care plan section) Acute Rehab PT Goals Patient Stated Goal: resume HHPT PT Goal Formulation: With patient/family Time For Goal Achievement: 01/20/21 Potential to Achieve Goals: Fair Progress towards PT goals: Progressing toward goals    Frequency    Min 3X/week      PT Plan Current plan remains  appropriate    Co-evaluation              AM-PAC PT "6 Clicks" Mobility   Outcome Measure  Help needed turning from your back to your side while in a flat bed without using bedrails?: A Lot Help needed moving from lying on your back to sitting on the side of a flat bed without using bedrails?: A Lot Help needed moving to and from a bed to a chair (including a wheelchair)?: Total Help needed standing up from a chair using your arms (e.g., wheelchair or bedside chair)?: A Lot Help needed to walk in hospital room?: Total Help needed climbing 3-5 steps with a railing? : Total 6 Click Score: 9    End of Session Equipment Utilized During Treatment: Gait belt Activity Tolerance: Patient tolerated treatment well Patient left: in chair;with call bell/phone within reach;with family/visitor present Nurse Communication: Mobility status;Need for lift equipment PT Visit Diagnosis: Muscle weakness (generalized) (M62.81);Unsteadiness on feet (R26.81);Other abnormalities of gait and mobility (R26.89)     Time: 8768-1157 PT Time Calculation (min) (ACUTE ONLY): 30 min  Charges:  $Therapeutic Activity: 23-37 mins                     Abran Richard, PT Acute Rehab Services Pager [phone] [facility] [phone]     Karlton Lemon 01/10/2021, 4:05 PM

## 2021-01-10 NOTE — Progress Notes (Incomplete)
CC: 75 YO F presenting with back and lower extremity/bladder muscle spasms blood pressure trending down, low grade temperature. Concern for recurrent UTI.   PMH: MS (bedbound), A. Fib on warfarin and cardizem, hypothyroidism, depressiion, foley catheter   SH: Father CAD, HLD, Thyroid disease. Mother depression, anxiety, sudden death. Maternal Grandmother with CAD and Depression. Paternal grandmother breast cancer. Transportation from single family home to SNF.   Allergies: Chlorhexidine (hives), hydro and oxycodone (vomiting), sertraline (hives, swelling, rash)    Labs   Lactic acid normal - sepsis  Chest x ray no acute findings  Urinalysis: infection  Temp upon admission: 41F   INR Trending Down - flagyl and bactrim interaction with INR  - 2.6 (5/17) --> 2.2 (5/18) --> 2.0 (5/19) --> 2.3 (5/20) successful dose bump**  - still in goal of 2.3 but age and immobility puts her at higher clotting risk, watch closely and if this keeps trending down consider M, Th increase to 5mg   - Chest 2012: increase weekly dose by 10-15% INR 1.5-1.9, weekly dose 30 mg now so x.15 is 4.5 mg increase.  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

## 2021-01-10 NOTE — Progress Notes (Signed)
ANTICOAGULATION CONSULT NOTE - follow up  Pharmacy Consult for warfarin Indication: atrial fibrillation  Allergies  Allergen Reactions  . Other Rash    Topical Surgical prep  - severe rash  . Chlorhexidine Hives and Other (See Comments)    And, sores appear where applied  . Hydrocodone Nausea And Vomiting and Other (See Comments)    Hydrocodone causes vomiting Hydrocodone causes vomiting  . Oxycodone Nausea And Vomiting and Other (See Comments)    Oral oxycodone causes vomiting  . Sertraline Hives, Swelling, Rash and Other (See Comments)    [DERM][OTHER]RASH WITH SWELLING    Patient Measurements: Height: 5\' 2"  (157.5 cm) Weight: 83 kg (182 lb 15.7 oz) IBW/kg (Calculated) : 50.1   Vital Signs: Temp: 98.4 F (36.9 C) (05/23 1302) Temp Source: Oral (05/23 0514) BP: 113/56 (05/23 1302) Pulse Rate: 67 (05/23 1302)  Labs: Recent Labs    01/08/21 0352 01/10/21 0341  HGB 12.4 11.9*  HCT 37.7 36.3  PLT 175 167  LABPROT 26.7* 33.9*  INR 2.5* 3.3*  CREATININE 0.50 0.53    Estimated Creatinine Clearance: 62.6 mL/min (by C-G formula based on SCr of 0.53 mg/dL).   Assessment: Pt is a 83 yoF who takes warfarin PTA for atrial fibrillation. Pharmacy consulted to dose warfarin inpatient.   Home dose: 2.5mg  on Mondays and Thursdays, 5mg  all other days. Last dose PTA: 5/16  Today, 01/10/21  INR supratherapeutic today after rising steadily since 5/19 - 1 slightly boosted dose this admission; suspect this is more related to abx, acute illness, reduced PO intake  CBC: slightly low today but has otherwise been stable WNL the past week; Plt stable WNL  No bleeding reported  Diet: Heart healthy; meal intake not charted  DDI: None significant  Goal of Therapy:  INR 2-3  Plan:   Warfarin 2.5 mg today (scheduled dose) - hopefully lower dose will help keep INR in range  Daily INR, CBC q48  Monitor for s/s bleeding or thrombosis  At this time, would recommend  continuation of home dose at discharge.   Maelyn Berrey A, PharmD 01/10/21 2:10 PM

## 2021-01-11 ENCOUNTER — Ambulatory Visit: Payer: Medicare Other | Admitting: Neurology

## 2021-01-11 ENCOUNTER — Telehealth: Payer: Self-pay | Admitting: Neurology

## 2021-01-11 DIAGNOSIS — N3000 Acute cystitis without hematuria: Secondary | ICD-10-CM | POA: Diagnosis not present

## 2021-01-11 LAB — URINALYSIS, ROUTINE W REFLEX MICROSCOPIC
Bacteria, UA: NONE SEEN
Bilirubin Urine: NEGATIVE
Glucose, UA: NEGATIVE mg/dL
Hgb urine dipstick: NEGATIVE
Ketones, ur: NEGATIVE mg/dL
Leukocytes,Ua: NEGATIVE
Nitrite: POSITIVE — AB
Protein, ur: NEGATIVE mg/dL
Specific Gravity, Urine: 1.01 (ref 1.005–1.030)
pH: 6 (ref 5.0–8.0)

## 2021-01-11 LAB — COMPREHENSIVE METABOLIC PANEL
ALT: 18 U/L (ref 0–44)
AST: 18 U/L (ref 15–41)
Albumin: 3.2 g/dL — ABNORMAL LOW (ref 3.5–5.0)
Alkaline Phosphatase: 49 U/L (ref 38–126)
Anion gap: 5 (ref 5–15)
BUN: 17 mg/dL (ref 8–23)
CO2: 35 mmol/L — ABNORMAL HIGH (ref 22–32)
Calcium: 8.9 mg/dL (ref 8.9–10.3)
Chloride: 98 mmol/L (ref 98–111)
Creatinine, Ser: 0.62 mg/dL (ref 0.44–1.00)
GFR, Estimated: >60 mL/min (ref >60)
Glucose, Bld: 87 mg/dL (ref 70–99)
Potassium: 4 mmol/L (ref 3.5–5.1)
Sodium: 138 mmol/L (ref 135–145)
Total Bilirubin: 0.2 mg/dL — ABNORMAL LOW (ref 0.3–1.2)
Total Protein: 5.8 g/dL — ABNORMAL LOW (ref 6.5–8.1)

## 2021-01-11 LAB — PROTIME-INR
INR: 3 — ABNORMAL HIGH (ref 0.8–1.2)
Prothrombin Time: 31.4 seconds — ABNORMAL HIGH (ref 11.4–15.2)

## 2021-01-11 LAB — MAGNESIUM: Magnesium: 2.1 mg/dL (ref 1.7–2.4)

## 2021-01-11 LAB — CBC WITH DIFFERENTIAL/PLATELET
Abs Immature Granulocytes: 0.02 10*3/uL (ref 0.00–0.07)
Basophils Absolute: 0 10*3/uL (ref 0.0–0.1)
Basophils Relative: 1 %
Eosinophils Absolute: 0.2 10*3/uL (ref 0.0–0.5)
Eosinophils Relative: 3 %
HCT: 40.8 % (ref 36.0–46.0)
Hemoglobin: 13.4 g/dL (ref 12.0–15.0)
Immature Granulocytes: 0 %
Lymphocytes Relative: 34 %
Lymphs Abs: 2.3 10*3/uL (ref 0.7–4.0)
MCH: 31.6 pg (ref 26.0–34.0)
MCHC: 32.8 g/dL (ref 30.0–36.0)
MCV: 96.2 fL (ref 80.0–100.0)
Monocytes Absolute: 0.8 10*3/uL (ref 0.1–1.0)
Monocytes Relative: 12 %
Neutro Abs: 3.3 10*3/uL (ref 1.7–7.7)
Neutrophils Relative %: 50 %
Platelets: 188 10*3/uL (ref 150–400)
RBC: 4.24 MIL/uL (ref 3.87–5.11)
RDW: 12.7 % (ref 11.5–15.5)
WBC: 6.8 10*3/uL (ref 4.0–10.5)
nRBC: 0 % (ref 0.0–0.2)

## 2021-01-11 LAB — PHOSPHORUS: Phosphorus: 3.8 mg/dL (ref 2.5–4.6)

## 2021-01-11 MED ORDER — BACLOFEN 20 MG PO TABS
20.0000 mg | ORAL_TABLET | Freq: Four times a day (QID) | ORAL | Status: DC | PRN
  Administered 2021-01-11 – 2021-01-13 (×7): 20 mg via ORAL
  Filled 2021-01-11 (×7): qty 1

## 2021-01-11 MED ORDER — WARFARIN SODIUM 5 MG PO TABS
5.0000 mg | ORAL_TABLET | Freq: Once | ORAL | Status: AC
  Administered 2021-01-11: 5 mg via ORAL
  Filled 2021-01-11: qty 1

## 2021-01-11 MED ORDER — PHENAZOPYRIDINE HCL 200 MG PO TABS
200.0000 mg | ORAL_TABLET | Freq: Three times a day (TID) | ORAL | Status: AC
  Administered 2021-01-11 (×3): 200 mg via ORAL
  Filled 2021-01-11 (×3): qty 1

## 2021-01-11 NOTE — Telephone Encounter (Signed)
I got a call from Dr. Florene Glen, the patient was admitted to the hospital with a bladder infection.  Her leg spasticity has worsened with the infection, they will supplement with oral baclofen.  If the baclofen is not fully effective, we can readjust her pump settings after she is out of the hospital.

## 2021-01-11 NOTE — Progress Notes (Addendum)
PROGRESS NOTE    Toni [name]  UXY:[id number] DOB: 04/30/1946 DOA: 01/03/2021 PCP: Prince Solian, MD   Chief Complaint  Patient presents with  . foul urine  . rectal discharge    Brief Narrative:  Toni [name] Toni [name] 75 y.o.femalewithhistory of multiple sclerosis largely bedbound, Toni [name]. 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  She was recently seen by her neurologist and her baclofen pump was adjusted for Toni [name] higher dose and also refilled (11/30/20).  She has Toni [name] history of recurrent UTIs and follows with urology and is being evaluated currently for possible suprapubic catheter placement. She has been on multiple antibiotic courses and most recently states that she was started on chronic suppressive antibiotic therapy (she thinks Keflex).  She was admitted for her bladder spasms and concern for recurrent UTI.  She was started on Rocephin on admission.  IV Ativan has worked for her ongoing bladder spasms.  Her case was also discussed with neuro on admission (see H&P; per Dr. Leonie [name], if no improvement in spasms after UTI treatment then neuro eval can be pursued).  She's improved somewhat since admission, though still having worse spasms than at home.  Case discussed with neurosurgery who recommended MRI C/T spine which were without new concerning findings.  Plan is for outpatient follow up with neurosurgery.  Continuing to titrate baclofen Po at this time due to concern for baclofen pump functioning.  Possibly discharge tomorrow, 5/25.  Her foley has been discontinued, due to continued dysuria, Toni [name] repeat UA has been sent.   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 Vs baclofen pump malfunction? - s/p treatment of UTI as below  - treating spasms with oxybutynin, prn ativan, prn baclofen - continue to follow symptoms with titration of meds  MS  Worsening muscle spasms - concern for baclofen pum malfunction - appreciate neurosurgery recommendations -> follow MRI C/T spine (C spine with no change since 2020 - chronic fusion from C3-C7, some chronic narrowing of the canal, but without ongoing cord compression.  Some cord myelomalacia/volume loss in the mid cervical region as seen previously.  No progressive finding.  C2-3 mild degenerative sponylosis and facet OA with mild bilateral foraminal narrowing.  C7-T1 shallow disc herniation with slight caudal down turning.  T spine with thoracic cord atrophy throughout the region without evidence of cord compression or focal cord lession.  Small chronic central disc herniation at T4-5 without apparent neural compression.  Chronic appearing disc herniation with upward migration at T12-L1 without apparent neural compression.  Facet and ligamentous hypertrophy at T10-11 with some foraminal narrowing on L) - discussed imaging with Dr. Zada [name], ok with outpatient follow up with Dr. Vertell [name] (discussed with Dr. Vertell [name] as well, who is ok with outpatient follow up) - given concern for pump malfunction, will increase prn baclofen frequency to 20 mg QID prn and follow symptoms - continue xanax and ativan (  PRN; the xanax is Toni [name] home med) - continue PRN baclofen, gaba, baclofen pump - takes ACTHAR as needed outpatient, does this relatively frequently? Per outpatient neurologist.  Enterococcus Faecalis and  Pseudomonas UTI  Indwelling Foley Catheter - s/p fosfomycin - foley was discontinued (it was placed to allow labial wound to heal) - follow up culture (pansensitive enterococcus, pseudomonas sensitive to ceftaz, imipenem, zosyn) - foley d/c'd, she's still having dysuria, will follow repeat UA and culture  Hx Labial Wound - 2/2 purewick - caution with purewick, minimize use as able  Constipation - continue current regimen  Afib - continue diltiazem for now; hold parameters entered - diltiazem decreased to 120 mg daily per her request, follow HR - continue coumadin per pharmacy  Central sleep apnea - continue BiPAP  Depression - Continue Lexapro and Wellbutrin  Intertrigo - trial of antifungal and steroid cream given pruritis (she notes this combination helped outpatient) - follow  DVT prophylaxis: warfarin Code Status: full  Family Communication: daughter at bedside Disposition:   Status is: Inpatient  Remains inpatient appropriate because:Inpatient level of care appropriate due to severity of illness   Dispo: The patient is from: Home              Anticipated d/c is to: Home              Patient currently is not medically stable to d/c.   Difficult to place patient No       Consultants:   none  Procedures:  none  Antimicrobials: Anti-infectives (From admission, onward)   Start     Dose/Rate Route Frequency Ordered Stop   01/06/21 1200  fosfomycin (MONUROL) packet 3 g        3 g Oral every 72 hours 01/06/21 1052 01/09/21 1107   01/05/21 1215  ampicillin (OMNIPEN) 1 g in sodium chloride 0.9 % 100 mL IVPB  Status:  Discontinued        1 g 300 mL/hr over 20 Minutes Intravenous Every 6 hours 01/05/21 1117 01/06/21 1051   01/03/21 1845  cefTRIAXone (ROCEPHIN) 1 g in sodium chloride 0.9 % 100 mL IVPB  Status:  Discontinued        1 g 200 mL/hr over 30 Minutes Intravenous Every 24 hours 01/03/21 1837 01/05/21 1117         Subjective: Still having  spasms, not as bad as day of admission, but worse than typical at home  Objective: Vitals:   01/10/21 1302 01/10/21 2007 01/11/21 0436 01/11/21 1256  BP: (!) 113/56 (!) 104/48 (!) 109/57 (!) 127/47  Pulse: 67 72 65 79  Resp: 19 18 18 18   Temp: 98.4 F (36.9 C) 98.2 F (36.8 C) (!) 97.5 F (36.4 C) 97.8 F (36.6 C)  TempSrc:  Oral Axillary Oral  SpO2: 99% 99% 100% 100%  Weight:      Height:        Intake/Output Summary (Last 24 hours) at 01/11/2021 1546 Last data filed at 01/11/2021 1440 Gross per 24 hour  Intake 480 ml  Output 1950 ml  Net -1470 ml   Filed Weights   01/03/21 1614  Weight: 83 kg    Examination:  General: No acute distress. Sitting up in chair. Cardiovascular:RRR. Lungs: unlabored Abdomen: Soft, nontender, nondistended Neurological: Alert and oriented 3. Moves all extremities 4 . Cranial nerves II through XII grossly intact. Skin: Warm and dry. No rashes or lesions. Extremities: No clubbing or cyanosis. No edema.  Data Reviewed: I have personally reviewed  following labs and imaging studies  CBC: Recent Labs  Lab 01/06/21 0328 01/07/21 0332 01/08/21 0352 01/10/21 0341 01/11/21 0920  WBC 8.4 8.6 7.6 7.2 6.8  NEUTROABS 3.8 4.6 3.9 3.2 3.3  HGB 12.2 12.6 12.4 11.9* 13.4  HCT 36.8 38.5 37.7 36.3 40.8  MCV 94.6 96.3 95.0 95.8 96.2  PLT 169 173 175 167 101    Basic Metabolic Panel: Recent Labs  Lab 01/06/21 0328 01/07/21 0332 01/08/21 0352 01/10/21 0341 01/11/21 0339  NA 139 140 140 139 138  K 3.8 4.1 4.1 4.1 4.0  CL 101 101 99 98 98  CO2 35* 35* 34* 34* 35*  GLUCOSE 92 97 85 95 87  BUN 20 19 17 17 17   CREATININE 0.60 0.52 0.50 0.53 0.62  CALCIUM 8.6* 8.8* 8.9 8.6* 8.9  MG 2.0 2.0 2.1 2.2 2.1  PHOS  --  3.9 4.1 3.7 3.8    GFR: Estimated Creatinine Clearance: 62.6 mL/min (by C-G formula based on SCr of 0.62 mg/dL).  Liver Function Tests: Recent Labs  Lab 01/06/21 0328 01/07/21 0332 01/08/21 0352 01/10/21 0341  01/11/21 0339  AST 15 18 19 16 18   ALT 15 17 15 15 18   ALKPHOS 48 54 52 49 49  BILITOT 0.6 0.5 0.5 0.4 0.2*  PROT 5.6* 6.0* 5.6* 5.7* 5.8*  ALBUMIN 3.2* 3.4* 3.3* 3.2* 3.2*    CBG: No results for input(s): GLUCAP in the last 168 hours.   Recent Results (from the past 240 hour(s))  Culture, blood (Routine x 2)     Status: None   Collection Time: 01/03/21  3:21 PM   Specimen: Right Antecubital; Blood  Result Value Ref Range Status   Specimen Description   Final    RIGHT ANTECUBITAL BLOOD Performed at [facility], [address] [place], [place] [address]    Special Requests   Final    BOTTLES DRAWN AEROBIC AND ANAEROBIC Blood Culture adequate volume Performed at [facility] [address] [place], [place] [address]    Culture   Final    NO GROWTH 6 DAYS Performed at [facility], [place] [address] [place], [place] [address]    Report Status 01/09/2021 FINAL  Final  Culture, blood (Routine x 2)     Status: None   Collection Time: 01/03/21  3:26 PM   Specimen: BLOOD RIGHT HAND  Result Value Ref Range Status   Specimen Description   Final    BLOOD RIGHT HAND Performed at [facility] [address], [place], [place] [address]    Special Requests   Final    BOTTLES DRAWN AEROBIC AND ANAEROBIC Blood Culture adequate volume Performed at [facility] [address], [place], [place] [address]    Culture   Final    NO GROWTH 6 DAYS Performed at [facility], [place] [address], [place], [place] [address]    Report Status 01/09/2021 FINAL  Final  Culture, Urine     Status: Abnormal   Collection Time: 01/03/21  4:00 PM   Specimen: Urine, Random  Result Value Ref Range Status   Specimen Description   Final    URINE, RANDOM Performed at [facility] [address], [place], [place] [address]    Special Requests   Final    NONE Performed at [facility], [place] [address] [place], [place] [address]    Culture (Terran Klinke)  Final    >=  100,000 COLONIES/mL ENTEROCOCCUS FAECALIS 20,000 COLONIES/mL PSEUDOMONAS AERUGINOSA    Report Status 01/07/2021 FINAL  Final   Organism ID, Bacteria ENTEROCOCCUS FAECALIS (Shana Younge)  Final   Organism ID, Bacteria PSEUDOMONAS AERUGINOSA (Kadance Mccuistion)  Final      Susceptibility   Enterococcus faecalis - MIC*    AMPICILLIN <=2 SENSITIVE Sensitive     NITROFURANTOIN <=16 SENSITIVE Sensitive     VANCOMYCIN 1 SENSITIVE Sensitive     * >=100,000 COLONIES/mL ENTEROCOCCUS FAECALIS   Pseudomonas aeruginosa - MIC*    CEFTAZIDIME 8 SENSITIVE Sensitive     CIPROFLOXACIN 2 INTERMEDIATE Intermediate     GENTAMICIN 8 INTERMEDIATE Intermediate     IMIPENEM 2 SENSITIVE Sensitive     PIP/TAZO 16 SENSITIVE Sensitive     * 20,000 COLONIES/mL PSEUDOMONAS AERUGINOSA  SARS CORONAVIRUS 2 (TAT 6-24 HRS) Nasopharyngeal Nasopharyngeal Swab     Status: None   Collection Time: 01/03/21  8:57 PM   Specimen: Nasopharyngeal Swab  Result Value Ref Range Status   SARS Coronavirus 2 NEGATIVE NEGATIVE Final    Comment: (NOTE) SARS-CoV-2 target nucleic acids are NOT DETECTED.  The SARS-CoV-2 RNA is generally detectable in upper and lower respiratory specimens during the acute phase of infection. Negative results do not preclude SARS-CoV-2 infection, do not rule out co-infections with other pathogens, and should not be used as the sole basis for treatment or other patient management decisions. Negative results must be combined with clinical observations, patient history, and epidemiological information. The expected result is Negative.  Fact Sheet for Patients: SugarRoll.be  Fact Sheet for Healthcare Providers: https://www.woods-mathews.com/  This test is not yet approved or cleared by the [place] FDA and  has been authorized for detection and/or diagnosis of SARS-CoV-2 by FDA under an Emergency Use Authorization  (EUA). This EUA will remain  in effect (meaning this test can be used) for the duration of the COVID-19 declaration under Se ction 564(b)(1) of the Act, 21 U.S.C. section 360bbb-3(b)(1), unless the authorization is terminated or revoked sooner.  Performed at [facility], [place] [address] [place], [place] [address]   SARS CORONAVIRUS 2 (TAT 6-24 HRS) Nasopharyngeal Nasopharyngeal Swab     Status: None   Collection Time: 01/10/21  5:40 AM   Specimen: Nasopharyngeal Swab  Result Value Ref Range Status   SARS Coronavirus 2 NEGATIVE NEGATIVE Final    Comment: (NOTE) SARS-CoV-2 target nucleic acids are NOT DETECTED.  The SARS-CoV-2 RNA is generally detectable in upper and lower respiratory specimens during the acute phase of infection. Negative results do not preclude SARS-CoV-2 infection, do not rule out co-infections with other pathogens, and should not be used as the sole basis for treatment or other patient management decisions. Negative results must be combined with clinical observations, patient history, and epidemiological information. The expected result is Negative.  Fact Sheet for Patients: SugarRoll.be  Fact Sheet for Healthcare Providers: https://www.woods-mathews.com/  This test is not yet approved or cleared by the [place] FDA and  has been authorized for detection and/or diagnosis of SARS-CoV-2 by FDA under an Emergency Use Authorization (EUA). This EUA will remain  in effect (meaning this test can be used) for the duration of the COVID-19 declaration under Se ction 564(b)(1) of the Act, 21 U.S.C. section 360bbb-3(b)(1), unless the authorization is terminated or revoked sooner.  Performed at [facility], [place] [address], [place], [place] [address]          Radiology Studies: MR CERVICAL SPINE WO CONTRAST  Result Date:  01/10/2021 CLINICAL DATA:  Multiple sclerosis.  Spasticity. EXAM: MRI CERVICAL  SPINE WITHOUT CONTRAST TECHNIQUE: Multiplanar, multisequence MR imaging of the cervical spine was performed. No intravenous contrast was administered. COMPARISON:  08/04/2019 FINDINGS: Alignment: Chronic straightening and slight kyphotic curvature in the cervicothoracic spine as seen previously. Vertebrae: Solid fusion from C3 through C7. Cord: No cord compression. Chronic canal narrowing in the mid cervical region but without change. Chronic myelomalacia without change. Posterior Fossa, vertebral arteries, paraspinal tissues: Negative Disc levels: Foramen magnum is sufficiently patent. Ordinary osteoarthritis at the C1-2 articulation with mild narrowing of the canal but no compression of the upper cervical cord. C2-3: Disc bulge. Facet osteoarthritis. No compressive canal stenosis. Mild bilateral foraminal narrowing. C3 through C7: Solid union as described above. Chronic narrowing of the spinal canal, but with sufficient subarachnoid space surrounding the cord. Some myelomalacia of the cord in the mid cervical region as seen previously. C7-T1: Shallow disc protrusion with slight caudal down turning behind the superior endplate of T1. Narrowing of the ventral subarachnoid space but no compressive effect upon the cord and no foraminal extension. T1-2: Negative IMPRESSION: No change since 2020. Chronic fusion from C3 through C7. Some chronic narrowing of the canal but without ongoing cord compression. Some cord myelomalacia/volume loss in the mid cervical region as seen previously. No progressive finding. C2-3: Mild degenerative spondylosis and facet osteoarthritis with mild bilateral foraminal narrowing, not grossly compressive. C7-T1: Shallow disc herniation with slight caudal down turning. No compressive narrowing of the canal or foramina. Electronically Signed   By: Nelson Chimes M.D.   On: 01/10/2021 12:07   MR THORACIC SPINE WO CONTRAST  Result Date: 01/10/2021 CLINICAL DATA:  History of multiple sclerosis  and spasticity. Follow-up. EXAM: MRI THORACIC SPINE WITHOUT CONTRAST TECHNIQUE: Multiplanar, multisequence MR imaging of the thoracic spine was performed. No intravenous contrast was administered. COMPARISON:  No prior thoracic MRI. FINDINGS: Alignment:  No malalignment in the thoracic region. Vertebrae: Benign appearing hemangioma within the T10 vertebral body. Benign appearing hemangioma within the left pedicle of T6. No regional fracture or other focal bone finding. Cord: No cord compression. Cord atrophy throughout the thoracic region. No evidence of focal cord lesion. Paraspinal and other soft tissues: Negative Disc levels: No thoracic disc disease is at from T1-2 through T3-4. T4-5 shows Sten Dematteo small central disc herniation that indents the ventral subarachnoid space but does not have any affect upon the cord or show foraminal extension. T5-6 through T11-12 colon unremarkable disc spaces. Canal and foramina widely patent. Ordinary facet osteoarthritis at the T10-11 level with some foraminal encroachment on the left. T12-L1: Chronic appearing central disc herniation with slight upward migration. Narrowing of the ventral subarachnoid space but no compressive effect upon the neural structures. IMPRESSION: Thoracic cord atrophy throughout the region without evidence of cord compression or focal cord lesion. Small chronic central disc herniation at T4-5 without apparent neural compression. Chronic appearing central disc herniation with upward migration at T12-L1 without apparent neural compression. Facet and ligamentous hypertrophy at T10-11 with some foraminal narrowing on the left. Electronically Signed   By: Nelson Chimes M.D.   On: 01/10/2021 12:12        Scheduled Meds: . buPROPion  150 mg Oral Daily  . calcium-vitamin D  1 tablet Oral Q breakfast  . cholecalciferol  2,000 Units Oral Daily  . clotrimazole   Topical BID  . diclofenac Sodium  2 g Topical QID  . diltiazem  120 mg Oral Daily  .  escitalopram  10 mg Oral QHS  . famotidine  20 mg Oral QHS  . furosemide  40 mg Oral BID  . gabapentin  300 mg Oral Q6H  . hydrocortisone cream   Topical BID  . lactobacillus acidophilus & bulgar  2 tablet Oral TID WC  . levothyroxine  75 mcg Oral Q0600  . linaclotide  145 mcg Oral QAC breakfast  . melatonin  5 mg Oral QHS  . multivitamin with minerals  1 tablet Oral Daily  . omega-3 acid ethyl esters  1 g Oral Daily  . pantoprazole  40 mg Oral Daily  . phenazopyridine  200 mg Oral TID WC  . polyethylene glycol  17 g Oral BID  . potassium chloride SA  20 mEq Oral BID  . saccharomyces boulardii  250 mg Oral BID  . simvastatin  10 mg Oral QPM  . tamsulosin  0.4 mg Oral q AM  . warfarin  5 mg Oral ONCE-1600  . Warfarin - Pharmacist Dosing Inpatient   Does not apply q1600   Continuous Infusions:    LOS: 7 days    Time spent: over 30 min    Fayrene Helper, MD [facility]   To contact the attending provider between 7A-7P or the covering provider during after hours 7P-7A, please log into the web site [email or web address] and access using universal [place] password for that web site. If you do not have the password, please call the hospital operator.  01/11/2021, 3:46 PM

## 2021-01-11 NOTE — Plan of Care (Signed)
Patient up to chair with bariatric steady and 2 assist, caregiver and husband at bedside throughout shift.

## 2021-01-11 NOTE — Progress Notes (Signed)
ANTICOAGULATION CONSULT NOTE - follow up  Pharmacy Consult for warfarin Indication: atrial fibrillation  Allergies  Allergen Reactions  . Other Rash    Topical Surgical prep  - severe rash  . Chlorhexidine Hives and Other (See Comments)    And, sores appear where applied  . Hydrocodone Nausea And Vomiting and Other (See Comments)    Hydrocodone causes vomiting Hydrocodone causes vomiting  . Oxycodone Nausea And Vomiting and Other (See Comments)    Oral oxycodone causes vomiting  . Sertraline Hives, Swelling, Rash and Other (See Comments)    [DERM][OTHER]RASH WITH SWELLING    Patient Measurements: Height: 5\' 2"  (157.5 cm) Weight: 83 kg (182 lb 15.7 oz) IBW/kg (Calculated) : 50.1   Vital Signs: Temp: 97.5 F (36.4 C) (05/24 0436) Temp Source: Axillary (05/24 0436) BP: 109/57 (05/24 0436) Pulse Rate: 65 (05/24 0436)  Labs: Recent Labs    01/10/21 0341 01/11/21 0339  HGB 11.9*  --   HCT 36.3  --   PLT 167  --   LABPROT 33.9* 31.4*  INR 3.3* 3.0*  CREATININE 0.53 0.62    Estimated Creatinine Clearance: 62.6 mL/min (by C-G formula based on SCr of 0.62 mg/dL).   Assessment: Pt is a 82 yoF who takes warfarin PTA for atrial fibrillation. Pharmacy consulted to dose warfarin inpatient.   Home dose: 2.5mg  on Mondays and Thursdays, 5mg  all other days. Last dose PTA: 5/16  Today, 01/11/21  INR therapeutic today after being slightly elevated yesterday  CBC (5/23): slightly low but has otherwise been stable WNL the past week; Plt stable WNL  No bleeding reported  Diet: Heart healthy; meal intake not charted  DDI: None significant  Goal of Therapy:  INR 2-3  Plan:   Warfarin 5 mg today (scheduled dose)  Daily INR, CBC q48  Monitor for s/s bleeding or thrombosis  At this time, would recommend continuation of home dose at discharge.   Ekin Pilar A, PharmD 01/11/21 9:25 AM

## 2021-01-12 DIAGNOSIS — N3000 Acute cystitis without hematuria: Secondary | ICD-10-CM | POA: Diagnosis not present

## 2021-01-12 LAB — COMPREHENSIVE METABOLIC PANEL
ALT: 15 U/L (ref 0–44)
AST: 17 U/L (ref 15–41)
Albumin: 3.5 g/dL (ref 3.5–5.0)
Alkaline Phosphatase: 56 U/L (ref 38–126)
Anion gap: 8 (ref 5–15)
BUN: 20 mg/dL (ref 8–23)
CO2: 35 mmol/L — ABNORMAL HIGH (ref 22–32)
Calcium: 9 mg/dL (ref 8.9–10.3)
Chloride: 96 mmol/L — ABNORMAL LOW (ref 98–111)
Creatinine, Ser: 0.52 mg/dL (ref 0.44–1.00)
GFR, Estimated: >60 mL/min (ref >60)
Glucose, Bld: 71 mg/dL (ref 70–99)
Potassium: 4.1 mmol/L (ref 3.5–5.1)
Sodium: 139 mmol/L (ref 135–145)
Total Bilirubin: 0.4 mg/dL (ref 0.3–1.2)
Total Protein: 5.9 g/dL — ABNORMAL LOW (ref 6.5–8.1)

## 2021-01-12 LAB — CBC WITH DIFFERENTIAL/PLATELET
Abs Immature Granulocytes: 0.02 10*3/uL (ref 0.00–0.07)
Basophils Absolute: 0.1 10*3/uL (ref 0.0–0.1)
Basophils Relative: 1 %
Eosinophils Absolute: 0.4 10*3/uL (ref 0.0–0.5)
Eosinophils Relative: 7 %
HCT: 40.2 % (ref 36.0–46.0)
Hemoglobin: 13.3 g/dL (ref 12.0–15.0)
Immature Granulocytes: 0 %
Lymphocytes Relative: 33 %
Lymphs Abs: 2 10*3/uL (ref 0.7–4.0)
MCH: 31.8 pg (ref 26.0–34.0)
MCHC: 33.1 g/dL (ref 30.0–36.0)
MCV: 96.2 fL (ref 80.0–100.0)
Monocytes Absolute: 0.7 10*3/uL (ref 0.1–1.0)
Monocytes Relative: 12 %
Neutro Abs: 2.8 10*3/uL (ref 1.7–7.7)
Neutrophils Relative %: 47 %
Platelets: 186 10*3/uL (ref 150–400)
RBC: 4.18 MIL/uL (ref 3.87–5.11)
RDW: 12.6 % (ref 11.5–15.5)
WBC: 6.1 10*3/uL (ref 4.0–10.5)
nRBC: 0 % (ref 0.0–0.2)

## 2021-01-12 LAB — URINE CULTURE: Culture: NO GROWTH

## 2021-01-12 LAB — PHOSPHORUS: Phosphorus: 4.6 mg/dL (ref 2.5–4.6)

## 2021-01-12 LAB — MAGNESIUM: Magnesium: 2.2 mg/dL (ref 1.7–2.4)

## 2021-01-12 LAB — PROTIME-INR
INR: 2.7 — ABNORMAL HIGH (ref 0.8–1.2)
Prothrombin Time: 28.5 seconds — ABNORMAL HIGH (ref 11.4–15.2)

## 2021-01-12 MED ORDER — PHENAZOPYRIDINE HCL 100 MG PO TABS
100.0000 mg | ORAL_TABLET | Freq: Three times a day (TID) | ORAL | Status: DC
  Administered 2021-01-13 (×2): 100 mg via ORAL
  Filled 2021-01-12 (×3): qty 1

## 2021-01-12 MED ORDER — WARFARIN SODIUM 2.5 MG PO TABS
2.5000 mg | ORAL_TABLET | Freq: Once | ORAL | Status: AC
  Administered 2021-01-12: 2.5 mg via ORAL
  Filled 2021-01-12: qty 1

## 2021-01-12 NOTE — Progress Notes (Signed)
ANTICOAGULATION CONSULT NOTE - follow up  Pharmacy Consult for warfarin Indication: atrial fibrillation  Allergies  Allergen Reactions  . Other Rash    Topical Surgical prep  - severe rash  . Chlorhexidine Hives and Other (See Comments)    And, sores appear where applied  . Hydrocodone Nausea And Vomiting and Other (See Comments)    Hydrocodone causes vomiting Hydrocodone causes vomiting  . Oxycodone Nausea And Vomiting and Other (See Comments)    Oral oxycodone causes vomiting  . Sertraline Hives, Swelling, Rash and Other (See Comments)    [DERM][OTHER]RASH WITH SWELLING    Patient Measurements: Height: 5\' 2"  (157.5 cm) Weight: 83 kg (182 lb 15.7 oz) IBW/kg (Calculated) : 50.1   Vital Signs: Temp: 97.7 F (36.5 C) (05/25 0526) Temp Source: Oral (05/25 0526) BP: 110/57 (05/25 0526) Pulse Rate: 66 (05/25 0526)  Labs: Recent Labs    01/10/21 0341 01/11/21 0339 01/11/21 0920 01/12/21 0331  HGB 11.9*  --  13.4 13.3  HCT 36.3  --  40.8 40.2  PLT 167  --  188 186  LABPROT 33.9* 31.4*  --  28.5*  INR 3.3* 3.0*  --  2.7*  CREATININE 0.53 0.62  --  0.52    Estimated Creatinine Clearance: 62.6 mL/min (by C-G formula based on SCr of 0.52 mg/dL).   Assessment: Pt is a 22 yoF who takes warfarin PTA for atrial fibrillation. Pharmacy consulted to dose warfarin inpatient.   Home dose: 2.5mg  on Mondays and Thursdays, 5mg  all other days. Last dose PTA: 5/16  Today, 01/12/21  INR therapeutic today after being slightly elevated yesterday  CBC (5/23): slightly low but has otherwise been stable WNL the past week; Plt stable WNL  No bleeding reported  Diet: Heart healthy; meal intake not charted  DDI: None significant  Goal of Therapy:  INR 2-3  Plan:   Warfarin 2.5 mg today (scheduled dose)  Daily INR, CBC q48  Monitor for s/s bleeding or thrombosis  At this time, would recommend continuation of home dose at discharge.   Krissi Willaims, Cindie Laroche,  PharmD 01/12/21 3:44 PM

## 2021-01-12 NOTE — Progress Notes (Signed)
PROGRESS NOTE    Toni [name]  DTO:[id number] DOB: 02-27-1946 DOA: 01/03/2021 PCP: Prince Solian, MD   Brief Narrative: This 75 y.o.femalewithhistory of multiple sclerosis largely bedbound, A. fib on Coumadin , hypothyroidism, depression, probable neurogenic bladder s/p chronic indwelling foleywho was brought to the ER with blood pressure downtrending and low-grade temperature, 99 degrees.  She was recently seen by her neurologist and her baclofen pump was adjusted for a higher dose and also refilled(11/30/20). She has history of recurrent UTIs and follows with urology and is being evaluated currently for possible suprapubic catheter placement. She has been on multiple antibiotic courses and most recently states that she was started on chronic suppressive antibiotic therapy (she thinks Keflex).  She was admitted for her bladder spasms and concern for recurrent UTI. She was started on Rocephin on admission. IV Ativan has worked for her ongoing bladder spasms. Her case was also discussed with neuro on admission (see H&P; per Dr. Leonie Man, if no improvement in spasms after UTI treatment then neuro eval can be pursued).  She's improved somewhat since admission, though still having worse spasms than at home.  Case discussed with neurosurgery who recommended MRI C/T spine which were without new concerning findings.  Plan is for outpatient follow up with neurosurgery. Continuing to titrate baclofen Po at this time due to concern for baclofen pump malfunctioning. Her foley has been discontinued, due to continued dysuria, a repeat UA has been sent.   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 Most likely etiology could be secondary to UTI/ MS related/baclofen pump malfunction. S/p treatment for UTI. Continue oxybutynin, prn ativan, prn baclofen. Continue to follow symptoms with titration of meds.  MS  Worsening muscle spasms. Concern for baclofen pump malfunction - appreciate neurosurgery recommendations. Follow MRI C/T spine (C spine with no change since 2020 )  Discussed imaging with Dr. Zada Finders, recommended outpatient follow up with Dr. Vertell Limber (discussed with Dr. Vertell Limber as well, who is ok with outpatient follow up) Given concern for pump malfunction, will increase prn baclofen frequency to 20 mg QID prn and follow symptoms Continue xanax and ativan (PRN; the xanax is a home med) Continue PRN baclofen, gabapentin, baclofen pump She takes ACTHAR as needed outpatient, does this relatively frequently? Per outpatient neurologist.  Enterococcus Faecalis and Pseudomonas UTI  Indwelling Foley Catheter S/p fosfomycin Foley was discontinued (it was placed to allow labial wound to heal) Follow up culture (pansensitive enterococcus, pseudomonas sensitive to ceftaz, imipenem, zosyn) Foley was discontinued, she's still having dysuria,Repeat UA shows nitrite +,  blood cultures no growth.  Hx Labial Wound - 2/2 purewick - caution with purewick, minimize use as able  Constipation Continue current regimen  Afib: Continue diltiazem for now; hold parameters entered Diltiazem decreased to 120 mg daily per her request, follow HR Continue coumadin per pharmacy  Central sleep apnea Continue BiPAP  Depression Continue Lexapro and Wellbutrin  Intertrigo Trial of antifungal and steroid cream given pruritis (she notes this combination helped outpatient)   DVT prophylaxis: Warfarin Code Status: Full code. Family Communication:  Daughter at bedside. Disposition Plan:  Status is: Inpatient  Remains inpatient appropriate because:Inpatient level of care appropriate due to severity of illness   Dispo: The patient is from: Home              Anticipated d/c is to: Home Health PT.              Patient currently is not medically stable to d/c.   Difficult to place patient No   Consultants:    Neurology  Procedures: None. Antimicrobials:  Anti-infectives (From admission, onward)   Start     Dose/Rate Route Frequency Ordered Stop   01/06/21 1200  fosfomycin (MONUROL) packet 3 g        3 g Oral every 72 hours 01/06/21 1052 01/09/21 1107   01/05/21 1215  ampicillin (OMNIPEN) 1 g in sodium chloride 0.9 % 100 mL IVPB  Status:  Discontinued        1 g 300 mL/hr over 20 Minutes Intravenous Every 6 hours 01/05/21 1117 01/06/21 1051   01/03/21 1845  cefTRIAXone (ROCEPHIN) 1 g in sodium chloride 0.9 % 100 mL IVPB  Status:  Discontinued        1 g 200 mL/hr over 30 Minutes Intravenous Every 24 hours 01/03/21 1837 01/05/21 1117      Subjective: Patient was seen and examined at bedside.  She is lying comfortably on the bed,  denies any pain she reports cramps are improving,  denies any other concerns.   Objective: Vitals:   01/11/21 1256 01/11/21 2007 01/11/21 2027 01/12/21 0526  BP: (!) 127/47  (!) 112/51 (!) 110/57  Pulse: 79 79 69 66  Resp: 18 (!) 22    Temp: 97.8 F (36.6 C)  98.4 F (36.9 C) 97.7 F (36.5 C)  TempSrc: Oral  Oral Oral  SpO2: 100% 100% 99% 98%  Weight:      Height:        Intake/Output Summary (Last 24 hours) at 01/12/2021 1301 Last data filed at 01/12/2021 0529 Gross per 24 hour  Intake 480 ml  Output 2450 ml  Net -1970 ml   Filed Weights   01/03/21 1614  Weight: 83 kg    Examination:  General exam: Appears calm and comfortable, not in any acute distress. Respiratory system: Clear to auscultation. Respiratory effort normal. Cardiovascular system: S1 & S2 heard, RRR. No JVD,  murmurs, rubs, gallops or clicks. No pedal edema. Gastrointestinal system: Abdomen is nondistended, soft and nontender. No organomegaly or masses felt. Normal bowel sounds heard. Central nervous system: Alert and oriented. No focal neurological deficits. Extremities: Symmetric 5 x 5 power. Skin: No rashes, lesions or ulcers Psychiatry: Judgement and insight appear normal. Mood & affect appropriate.     Data Reviewed: I have personally reviewed following labs and imaging studies  CBC: Recent Labs  Lab 01/07/21 0332 01/08/21 0352 01/10/21 0341 01/11/21 0920 01/12/21 0331  WBC 8.6 7.6 7.2 6.8 6.1  NEUTROABS 4.6 3.9 3.2 3.3 2.8  HGB 12.6 12.4 11.9* 13.4 13.3  HCT 38.5 37.7 36.3 40.8 40.2  MCV 96.3 95.0 95.8 96.2 96.2  PLT 173 175 167 188 867   Basic Metabolic Panel: Recent Labs  Lab 01/07/21 0332 01/08/21 0352 01/10/21 0341 01/11/21 0339 01/12/21 0331  NA 140 140 139 138 139  K 4.1 4.1 4.1 4.0 4.1  CL 101 99 98 98 96*  CO2 35* 34* 34* 35* 35*  GLUCOSE 97 85 95 87 71  BUN 19 17 17 17  20  CREATININE 0.52 0.50 0.53 0.62 0.52  CALCIUM 8.8* 8.9 8.6* 8.9 9.0  MG 2.0 2.1 2.2 2.1 2.2  PHOS 3.9 4.1 3.7 3.8 4.6   GFR: Estimated Creatinine Clearance: 62.6 mL/min (by C-G formula based on SCr of 0.52 mg/dL). Liver Function Tests: Recent Labs  Lab 01/07/21 0332 01/08/21 0352 01/10/21 0341 01/11/21 0339 01/12/21 0331  AST 18 19 16 18 17   ALT 17 15 15 18 15   ALKPHOS 54 52 49 49 56  BILITOT 0.5 0.5 0.4 0.2* 0.4  PROT 6.0* 5.6* 5.7* 5.8* 5.9*  ALBUMIN 3.4* 3.3* 3.2* 3.2* 3.5   No results for input(s): LIPASE, AMYLASE in the last 168 hours. No results for input(s): AMMONIA in the last 168 hours. Coagulation Profile: Recent Labs  Lab 01/07/21 0332 01/08/21 0352 01/10/21 0341 01/11/21 0339 01/12/21 0331  INR 2.3* 2.5* 3.3* 3.0* 2.7*   Cardiac Enzymes: No results for input(s): CKTOTAL, CKMB, CKMBINDEX, TROPONINI in the last 168 hours. BNP (last 3 results) No results  for input(s): PROBNP in the last 8760 hours. HbA1C: No results for input(s): HGBA1C in the last 72 hours. CBG: No results for input(s): GLUCAP in the last 168 hours. Lipid Profile: No results for input(s): CHOL, HDL, LDLCALC, TRIG, CHOLHDL, LDLDIRECT in the last 72 hours. Thyroid Function Tests: No results for input(s): TSH, T4TOTAL, FREET4, T3FREE, THYROIDAB in the last 72 hours. Anemia Panel: No results for input(s): VITAMINB12, FOLATE, FERRITIN, TIBC, IRON, RETICCTPCT in the last 72 hours. Sepsis Labs: No results for input(s): PROCALCITON, LATICACIDVEN in the last 168 hours.  Recent Results (from the past 240 hour(s))  Culture, blood (Routine x 2)     Status: None   Collection Time: 01/03/21  3:21 PM   Specimen: Right Antecubital; Blood  Result Value Ref Range Status   Specimen Description   Final    RIGHT ANTECUBITAL BLOOD Performed at [facility], [address] [place], [place] [address]    Special Requests   Final    BOTTLES DRAWN AEROBIC AND ANAEROBIC Blood Culture adequate volume Performed at [facility] [address] [place], [place] [address]    Culture   Final    NO GROWTH 6 DAYS Performed at [facility], [place] [address] [place], [place] [address]    Report Status 01/09/2021 FINAL  Final  Culture, blood (Routine x 2)     Status: None   Collection Time: 01/03/21  3:26 PM   Specimen: BLOOD RIGHT HAND  Result Value Ref Range Status   Specimen Description   Final    BLOOD RIGHT HAND Performed at [facility] [address] [place], [place] [address]    Special Requests   Final    BOTTLES DRAWN AEROBIC AND ANAEROBIC Blood Culture adequate volume Performed at [facility] [address] [place], [place] [address]    Culture   Final    NO GROWTH 6 DAYS Performed at [facility], [place] [address], [place], [place] [address]    Report Status 01/09/2021 FINAL  Final  Culture, Urine      Status: Abnormal   Collection Time: 01/03/21  4:00 PM   Specimen: Urine, Random  Result Value Ref Range Status   Specimen Description   Final    URINE, RANDOM Performed at [facility] [address], [place],  [address]    Special Requests   Final    NONE Performed at [facility], [place] [address] [place], [place] [address]    Culture (A)  Final    >=100,000 COLONIES/mL ENTEROCOCCUS FAECALIS 20,000 COLONIES/mL PSEUDOMONAS AERUGINOSA    Report Status 01/07/2021 FINAL  Final   Organism ID, Bacteria ENTEROCOCCUS FAECALIS (A)  Final   Organism ID, Bacteria PSEUDOMONAS AERUGINOSA (A)  Final      Susceptibility   Enterococcus faecalis - MIC*    AMPICILLIN <=2 SENSITIVE Sensitive     NITROFURANTOIN <=16 SENSITIVE Sensitive     VANCOMYCIN 1 SENSITIVE Sensitive     * >=100,000 COLONIES/mL ENTEROCOCCUS FAECALIS   Pseudomonas aeruginosa - MIC*    CEFTAZIDIME 8 SENSITIVE Sensitive     CIPROFLOXACIN 2 INTERMEDIATE Intermediate     GENTAMICIN 8 INTERMEDIATE Intermediate     IMIPENEM 2 SENSITIVE Sensitive     PIP/TAZO 16 SENSITIVE Sensitive     * 20,000 COLONIES/mL PSEUDOMONAS AERUGINOSA  SARS CORONAVIRUS 2 (TAT 6-24 HRS) Nasopharyngeal Nasopharyngeal Swab     Status: None   Collection Time: 01/03/21  8:57 PM   Specimen: Nasopharyngeal Swab  Result Value Ref Range Status   SARS Coronavirus 2 NEGATIVE NEGATIVE Final    Comment: (NOTE) SARS-CoV-2 target nucleic acids are NOT DETECTED.  The SARS-CoV-2 RNA is generally detectable in upper and lower respiratory specimens during the acute phase of infection. Negative results do not preclude SARS-CoV-2 infection, do not rule out co-infections with other pathogens, and should not be used as the sole basis for treatment or other patient management decisions. Negative results must be combined with clinical observations, patient history, and epidemiological information. The expected result is  Negative.  Fact Sheet for Patients: SugarRoll.be  Fact Sheet for Healthcare Providers: https://www.woods-mathews.com/  This test is not yet approved or cleared by the [place] FDA and  has been authorized for detection and/or diagnosis of SARS-CoV-2 by FDA under an Emergency Use Authorization (EUA). This EUA will remain  in effect (meaning this test can be used) for the duration of the COVID-19 declaration under Se ction 564(b)(1) of the Act, 21 U.S.C. section 360bbb-3(b)(1), unless the authorization is terminated or revoked sooner.  Performed at [facility], [place] [address] [place], [place] [address]   SARS CORONAVIRUS 2 (TAT 6-24 HRS) Nasopharyngeal Nasopharyngeal Swab     Status: None   Collection Time: 01/10/21  5:40 AM   Specimen: Nasopharyngeal Swab  Result Value Ref Range Status   SARS Coronavirus 2 NEGATIVE NEGATIVE Final    Comment: (NOTE) SARS-CoV-2 target nucleic acids are NOT DETECTED.  The SARS-CoV-2 RNA is generally detectable in upper and lower respiratory specimens during the acute phase of infection. Negative results do not preclude SARS-CoV-2 infection, do not rule out co-infections with other pathogens, and should not be used as the sole basis for treatment or other patient management decisions. Negative results must be combined with clinical observations, patient history, and epidemiological information. The expected result is Negative.  Fact Sheet for Patients: SugarRoll.be  Fact Sheet for Healthcare Providers: https://www.woods-mathews.com/  This test is not yet approved or cleared by the [place] FDA and  has been authorized for detection and/or diagnosis of SARS-CoV-2 by FDA under an Emergency Use Authorization (EUA). This EUA will remain  in effect (meaning this test can be used) for the duration of the COVID-19 declaration under Se ction 564(b)(1) of  the Act, 21 U.S.C. section 360bbb-3(b)(1), unless the authorization is terminated or revoked sooner.  Performed at [facility], [place] [address], [place], [place] [address]  Culture, Urine     Status: None   Collection Time: 01/11/21  1:00 PM   Specimen: Urine, Catheterized  Result Value Ref Range Status   Specimen Description   Final    URINE, CATHETERIZED Performed at [facility], [place] [address], [place], [place] [address]    Special Requests   Final    NONE Performed at [facility], [place] [address] [place], [place] [address]    Culture   Final    NO GROWTH Performed at [facility], [place] [address], [place], [place] [address]    Report Status 01/12/2021 FINAL  Final    Radiology Studies: No results found.  Scheduled Meds: . buPROPion  150 mg Oral Daily  . calcium-vitamin D  1 tablet Oral Q breakfast  . cholecalciferol  2,000 Units Oral Daily  . clotrimazole   Topical BID  . diclofenac Sodium  2 g Topical QID  . diltiazem  120 mg Oral Daily  . escitalopram  10 mg Oral QHS  . famotidine  20 mg Oral QHS  . furosemide  40 mg Oral BID  . gabapentin  300 mg Oral Q6H  . hydrocortisone cream   Topical BID  . lactobacillus acidophilus & bulgar  2 tablet Oral TID WC  . levothyroxine  75 mcg Oral Q0600  . linaclotide  145 mcg Oral QAC breakfast  . melatonin  5 mg Oral QHS  . multivitamin with minerals  1 tablet Oral Daily  . omega-3 acid ethyl esters  1 g Oral Daily  . pantoprazole  40 mg Oral Daily  . polyethylene glycol  17 g Oral BID  . potassium chloride SA  20 mEq Oral BID  . saccharomyces boulardii  250 mg Oral BID  . simvastatin  10 mg Oral QPM  . tamsulosin  0.4 mg Oral q AM  . Warfarin - Pharmacist Dosing Inpatient   Does not apply q1600   Continuous Infusions:   LOS: 8 days    Time spent: 35 mins    Natalya Domzalski, MD [facility]   If 7PM-7AM, please contact night-coverage

## 2021-01-12 NOTE — Progress Notes (Signed)
Occupational Therapy Treatment Patient Details Name: Toni [name] MRN: [id number] DOB: 04/03/1946 Today's Date: 01/12/2021    History of present illness Pt is a 75 y.o. female admitted 01/03/21 for bladder spasms, blood pressure downtrending and low-grade temperature. PMH includes: Anxiety, Arthritis, Asthma, Back pain, Bilateral carpal tunnel syndrome (10/04/2020), Cancer, CHF, Chronic pain, Complex atypical endometrial hyperplasia, Depression, DM, Dysrhythmia, Edema, GERD, HLD, Hypertension, Hypotension, Hypothyroidism, Multiple sclerosis (largely bed bound), OSA on CPAP, Osteoarthritis, PAF, Recurrant UTI (indwelling cath, being considered for suprapubic catheter), and Sleep apnea.   OT comments  Pt still in bed upon arrival awaiting meds and for staff to assist her to [facility] with lift. OT attempted to see pt earlier in a.m., however to requested OT return after nursing staff assisted her to chair as she was not ready to get OOB yet. Once OT returned later, pt  still in bed and states that she was getting ready to take meds and was going to ask staff again about getting her up soon. Pt agreeable to B UE there ex at bed level. Pt's daughter present and supportive. Pt provided with level 2 orange theraband for B UE exercises for strength and ROM for functional tasks. Pt able to return demo exercises properly. Pt hopeful to d/c to SNF tomorrow  Follow Up Recommendations  SNF    Equipment Recommendations  Other (comment) (TBD at SNF)    Recommendations for Other Services      Precautions / Restrictions Precautions Precautions: Fall Precaution Comments: R ankle is sprained and requires wrapping or shoe prior to WB Restrictions Other Position/Activity Restrictions: mindful of R ankle       Mobility Bed Mobility                    Transfers                      Balance                                           ADL either performed or assessed with  clinical judgement   ADL                                               Vision Patient Visual Report: No change from baseline     Perception     Praxis      Cognition Arousal/Alertness: Awake/alert Behavior During Therapy: WFL for tasks assessed/performed Overall Cognitive Status: Within Functional Limits for tasks assessed                                          Exercises Other Exercises Other Exercises: pt provided with level 2 orange theraband for B UE exercises for strength and ROM for functional tasks   Shoulder Instructions       General Comments      Pertinent Vitals/ Pain       Pain Assessment: No/denies pain Faces Pain Scale: No hurt Pain Intervention(s): Monitored during session  Home Living  Prior Functioning/Environment              Frequency  Min 2X/week        Progress Toward Goals  OT Goals(current goals can now be found in the care plan section)  Progress towards OT goals: OT to reassess next treatment  ADL Goals Additional ADL Goal #1: Pt will tolerate B UE ther ex with level 2 (orange) theraband to increase B UE strength and ROM for functional tasks seated in chair  Plan      Co-evaluation                 AM-PAC OT "6 Clicks" Daily Activity     Outcome Measure   Help from another person eating meals?: None Help from another person taking care of personal grooming?: A Little Help from another person toileting, which includes using toliet, bedpan, or urinal?: A Lot Help from another person bathing (including washing, rinsing, drying)?: A Lot Help from another person to put on and taking off regular upper body clothing?: A Lot Help from another person to put on and taking off regular lower body clothing?: Total 6 Click Score: 14    End of Session    OT Visit Diagnosis: Other abnormalities of gait and mobility (R26.89);Muscle  weakness (generalized) (M62.81);Pain Pain - Right/Left: Right Pain - part of body: Ankle and joints of foot   Activity Tolerance Patient tolerated treatment well   Patient Left in bed;with call bell/phone within reach;with family/visitor present;with nursing/sitter in room   Nurse Communication Precautions;Weight bearing status        Time: 1206-1218 OT Time Calculation (min): 12 min  Charges: OT General Charges $OT Visit: 1 Visit OT Treatments $Therapeutic Exercise: 8-22 mins     Britt Bottom 01/12/2021, 1:49 PM

## 2021-01-12 NOTE — TOC Progression Note (Signed)
Transition of Care [name]) - Progression Note    Patient Details  Name: Toni [name] MRN: [id number] Date of Birth: [birth year]  Transition of Care [facility]) CM/SW Contact  Ross Ludwig, [place] Phone Number: 01/12/2021, 3:36 PM  Clinical Narrative:     CSW was informed that per physician, patient may be ready for discharge tomorrow.  CSW updated [employer].   Expected Discharge Plan: Skilled Nursing Facility Barriers to Discharge: No Barriers Identified  Expected Discharge Plan and Services Expected Discharge Plan: [place] Choice: [place] Living arrangements for the past 2 months: Single Family Home                                       Social Determinants of Health (SDOH) Interventions    Readmission Risk Interventions Readmission Risk Prevention Plan 06/11/2020  Transportation Screening Complete  PCP or Specialist appointment within 3-5 days of discharge Not Complete  PCP/Specialist Appt Not Complete comments MD office reports they have to call the pt and schedule this appt themselves when they receive notice of [place] or Home Care Consult Complete  SW Recovery Care/Counseling Consult Complete  [place] Not Applicable  Some recent data might be hidden

## 2021-01-12 NOTE — Progress Notes (Addendum)
Physical Therapy Treatment Patient Details Name: Toni [name] MRN: [id number] DOB: 1946/01/27 Today's Date: 01/12/2021    History of Present Illness Pt is a 75 y.o. female admitted 01/03/21 for bladder spasms, blood pressure downtrending and low-grade temperature. PMH includes: Anxiety, Arthritis, Asthma, Back pain, Bilateral carpal tunnel syndrome (10/04/2020), Cancer, CHF, Chronic pain, Complex atypical endometrial hyperplasia, Depression, DM, Dysrhythmia, Edema, GERD, HLD, Hypertension, Hypotension, Hypothyroidism, Multiple sclerosis (largely bed bound), OSA on CPAP, Osteoarthritis, PAF, Recurrant UTI (indwelling cath, being considered for suprapubic catheter), and Sleep apnea.    PT Comments    Pt with good progress and participation.  Demonstrating improved transfers and tolerance for standing.  Continue to progress as able.  Did note plan for SNF as pt having difficulty with assist of 1; updated plan of care.  Follow Up Recommendations  SNF     Equipment Recommendations  None recommended by PT    Recommendations for Other Services       Precautions / Restrictions Precautions Precautions: Fall Precaution Comments: R ankle is sprained and requires wrapping or shoe prior to WB Restrictions Other Position/Activity Restrictions: mindful of R ankle    Mobility  Bed Mobility Overal bed mobility: Needs Assistance Bed Mobility: Rolling;Sit to Supine Rolling: Min assist     Sit to supine: Min assist   General bed mobility comments: Rolling both sides with min A; assist for legs back to bed    Transfers Overall transfer level: Needs assistance Equipment used: Ambulation equipment used Transfers: Sit to/from [facility] Sit to Stand: Min assist;Mod assist;+2 physical assistance Stand pivot transfers: Total assist (STEDY)       General transfer comment: Sit to stand from recliner with mod A x 2; Sit to stand from STEDY x 5 with min guard; Sit to stand from  [facility] x2 with mod A x 2; assist to rise and cues to tuck buttock; unable to completely straighten knees  Ambulation/Gait                 Stairs             Wheelchair Mobility    Modified Rankin (Stroke Patients Only)       Balance Overall balance assessment: Needs assistance Sitting-balance support: Feet supported;No upper extremity supported Sitting balance-Leahy Scale: Good Sitting balance - Comments: able to sit without UE support but prefers to hold on   Standing balance support: Bilateral upper extremity supported Standing balance-Leahy Scale: Poor Standing balance comment: Requiring bil UE support but stood in STEDY multiple times for 15-20 seconds                            Cognition Arousal/Alertness: Awake/alert Behavior During Therapy: WFL for tasks assessed/performed Overall Cognitive Status: Within Functional Limits for tasks assessed                                        Exercises General Exercises - Lower Extremity Ankle Circles/Pumps: AROM;Both;10 reps;Seated Long Arc Quad: AROM;Both;10 reps;Seated;Limitations Long Arc Quad Limitations: Orange T band on L; AROM on R Hip ABduction/ADduction: AROM;Both;10 reps;Seated;Limitations Hip Abduction/Adduction Limitations: ABD with orange T band clam shell in sitting; ADD with pillow squeeze same position Hip Flexion/Marching: AROM;Both;Seated;10 reps Other Exercises Other Exercises: pt provided with level 2 orange theraband for B UE exercises for strength and ROM for functional tasks Other Exercises:  Ham String Curls seated x 10 bil with orange T band    General Comments General comments (skin integrity, edema, etc.): Used BSC with total assist for ADLs      Pertinent Vitals/Pain Pain Assessment: No/denies pain Faces Pain Scale: No hurt Pain Intervention(s): Monitored during session    Home Living                      Prior Function            PT Goals  (current goals can now be found in the care plan section) Acute Rehab PT Goals Patient Stated Goal: resume HHPT PT Goal Formulation: With patient/family Time For Goal Achievement: 01/20/21 Potential to Achieve Goals: Fair Progress towards PT goals: Progressing toward goals    Frequency    Min 3X/week      PT Plan Discharge plan needs to be updated    Co-evaluation              AM-PAC PT "6 Clicks" Mobility   Outcome Measure  Help needed turning from your back to your side while in a flat bed without using bedrails?: A Lot Help needed moving from lying on your back to sitting on the side of a flat bed without using bedrails?: A Lot Help needed moving to and from a bed to a chair (including a wheelchair)?: Total Help needed standing up from a chair using your arms (e.g., wheelchair or bedside chair)?: A Lot Help needed to walk in hospital room?: Total Help needed climbing 3-5 steps with a railing? : Total 6 Click Score: 9    End of Session Equipment Utilized During Treatment: Gait belt Activity Tolerance: Patient tolerated treatment well Patient left: in bed;with call bell/phone within reach;with bed alarm set;with family/visitor present Nurse Communication: Mobility status;Need for lift equipment;Other (comment) (notified tech pt on bed pan) PT Visit Diagnosis: Muscle weakness (generalized) (M62.81);Unsteadiness on feet (R26.81);Other abnormalities of gait and mobility (R26.89)     Time: 4599-7741 PT Time Calculation (min) (ACUTE ONLY): 34 min  Charges:  $Therapeutic Exercise: 8-22 mins $Therapeutic Activity: 8-22 mins                     Abran Richard, PT Acute Rehab Services Pager [phone] [facility] [phone]     Karlton Lemon 01/12/2021, 5:36 PM

## 2021-01-13 ENCOUNTER — Telehealth: Payer: Self-pay

## 2021-01-13 DIAGNOSIS — T859XXA Unspecified complication of internal prosthetic device, implant and graft, initial encounter: Secondary | ICD-10-CM | POA: Diagnosis not present

## 2021-01-13 DIAGNOSIS — R531 Weakness: Secondary | ICD-10-CM | POA: Diagnosis not present

## 2021-01-13 DIAGNOSIS — Z888 Allergy status to other drugs, medicaments and biological substances status: Secondary | ICD-10-CM | POA: Diagnosis not present

## 2021-01-13 DIAGNOSIS — R300 Dysuria: Secondary | ICD-10-CM | POA: Diagnosis not present

## 2021-01-13 DIAGNOSIS — E240 Pituitary-dependent Cushing's disease: Secondary | ICD-10-CM | POA: Diagnosis not present

## 2021-01-13 DIAGNOSIS — E039 Hypothyroidism, unspecified: Secondary | ICD-10-CM | POA: Diagnosis not present

## 2021-01-13 DIAGNOSIS — G8929 Other chronic pain: Secondary | ICD-10-CM | POA: Diagnosis not present

## 2021-01-13 DIAGNOSIS — M542 Cervicalgia: Secondary | ICD-10-CM | POA: Diagnosis not present

## 2021-01-13 DIAGNOSIS — Z79890 Hormone replacement therapy (postmenopausal): Secondary | ICD-10-CM | POA: Diagnosis not present

## 2021-01-13 DIAGNOSIS — H9221 Otorrhagia, right ear: Secondary | ICD-10-CM | POA: Diagnosis not present

## 2021-01-13 DIAGNOSIS — L0390 Cellulitis, unspecified: Secondary | ICD-10-CM | POA: Diagnosis not present

## 2021-01-13 DIAGNOSIS — N3000 Acute cystitis without hematuria: Secondary | ICD-10-CM | POA: Diagnosis not present

## 2021-01-13 DIAGNOSIS — N319 Neuromuscular dysfunction of bladder, unspecified: Secondary | ICD-10-CM | POA: Diagnosis present

## 2021-01-13 DIAGNOSIS — R609 Edema, unspecified: Secondary | ICD-10-CM | POA: Diagnosis not present

## 2021-01-13 DIAGNOSIS — R110 Nausea: Secondary | ICD-10-CM | POA: Diagnosis not present

## 2021-01-13 DIAGNOSIS — G5603 Carpal tunnel syndrome, bilateral upper limbs: Secondary | ICD-10-CM | POA: Diagnosis not present

## 2021-01-13 DIAGNOSIS — U099 Post covid-19 condition, unspecified: Secondary | ICD-10-CM | POA: Diagnosis not present

## 2021-01-13 DIAGNOSIS — R0609 Other forms of dyspnea: Secondary | ICD-10-CM | POA: Diagnosis not present

## 2021-01-13 DIAGNOSIS — H04123 Dry eye syndrome of bilateral lacrimal glands: Secondary | ICD-10-CM | POA: Diagnosis not present

## 2021-01-13 DIAGNOSIS — G629 Polyneuropathy, unspecified: Secondary | ICD-10-CM | POA: Diagnosis not present

## 2021-01-13 DIAGNOSIS — R21 Rash and other nonspecific skin eruption: Secondary | ICD-10-CM | POA: Diagnosis not present

## 2021-01-13 DIAGNOSIS — J9611 Chronic respiratory failure with hypoxia: Secondary | ICD-10-CM | POA: Diagnosis not present

## 2021-01-13 DIAGNOSIS — M5459 Other low back pain: Secondary | ICD-10-CM | POA: Diagnosis not present

## 2021-01-13 DIAGNOSIS — N390 Urinary tract infection, site not specified: Secondary | ICD-10-CM | POA: Diagnosis not present

## 2021-01-13 DIAGNOSIS — L258 Unspecified contact dermatitis due to other agents: Secondary | ICD-10-CM | POA: Diagnosis not present

## 2021-01-13 DIAGNOSIS — I110 Hypertensive heart disease with heart failure: Secondary | ICD-10-CM | POA: Diagnosis not present

## 2021-01-13 DIAGNOSIS — M25512 Pain in left shoulder: Secondary | ICD-10-CM | POA: Diagnosis not present

## 2021-01-13 DIAGNOSIS — I480 Paroxysmal atrial fibrillation: Secondary | ICD-10-CM | POA: Diagnosis not present

## 2021-01-13 DIAGNOSIS — R400 Somnolence: Secondary | ICD-10-CM | POA: Diagnosis not present

## 2021-01-13 DIAGNOSIS — R1310 Dysphagia, unspecified: Secondary | ICD-10-CM | POA: Diagnosis not present

## 2021-01-13 DIAGNOSIS — R197 Diarrhea, unspecified: Secondary | ICD-10-CM | POA: Diagnosis not present

## 2021-01-13 DIAGNOSIS — R0600 Dyspnea, unspecified: Secondary | ICD-10-CM | POA: Diagnosis not present

## 2021-01-13 DIAGNOSIS — N3289 Other specified disorders of bladder: Secondary | ICD-10-CM | POA: Diagnosis not present

## 2021-01-13 DIAGNOSIS — Z8616 Personal history of COVID-19: Secondary | ICD-10-CM | POA: Diagnosis not present

## 2021-01-13 DIAGNOSIS — E119 Type 2 diabetes mellitus without complications: Secondary | ICD-10-CM | POA: Diagnosis not present

## 2021-01-13 DIAGNOSIS — Z981 Arthrodesis status: Secondary | ICD-10-CM | POA: Diagnosis not present

## 2021-01-13 DIAGNOSIS — B379 Candidiasis, unspecified: Secondary | ICD-10-CM | POA: Diagnosis not present

## 2021-01-13 DIAGNOSIS — I9589 Other hypotension: Secondary | ICD-10-CM | POA: Diagnosis present

## 2021-01-13 DIAGNOSIS — I469 Cardiac arrest, cause unspecified: Secondary | ICD-10-CM | POA: Diagnosis not present

## 2021-01-13 DIAGNOSIS — Z7901 Long term (current) use of anticoagulants: Secondary | ICD-10-CM | POA: Diagnosis not present

## 2021-01-13 DIAGNOSIS — Y838 Other surgical procedures as the cause of abnormal reaction of the patient, or of later complication, without mention of misadventure at the time of the procedure: Secondary | ICD-10-CM | POA: Diagnosis present

## 2021-01-13 DIAGNOSIS — G4700 Insomnia, unspecified: Secondary | ICD-10-CM | POA: Diagnosis not present

## 2021-01-13 DIAGNOSIS — Z9989 Dependence on other enabling machines and devices: Secondary | ICD-10-CM | POA: Diagnosis not present

## 2021-01-13 DIAGNOSIS — Z79899 Other long term (current) drug therapy: Secondary | ICD-10-CM | POA: Diagnosis not present

## 2021-01-13 DIAGNOSIS — F419 Anxiety disorder, unspecified: Secondary | ICD-10-CM | POA: Diagnosis not present

## 2021-01-13 DIAGNOSIS — F32A Depression, unspecified: Secondary | ICD-10-CM | POA: Diagnosis not present

## 2021-01-13 DIAGNOSIS — I5032 Chronic diastolic (congestive) heart failure: Secondary | ICD-10-CM | POA: Diagnosis not present

## 2021-01-13 DIAGNOSIS — R0902 Hypoxemia: Secondary | ICD-10-CM | POA: Diagnosis not present

## 2021-01-13 DIAGNOSIS — E785 Hyperlipidemia, unspecified: Secondary | ICD-10-CM | POA: Diagnosis not present

## 2021-01-13 DIAGNOSIS — T85615A Breakdown (mechanical) of other nervous system device, implant or graft, initial encounter: Secondary | ICD-10-CM | POA: Diagnosis not present

## 2021-01-13 DIAGNOSIS — R059 Cough, unspecified: Secondary | ICD-10-CM | POA: Diagnosis not present

## 2021-01-13 DIAGNOSIS — S00411A Abrasion of right ear, initial encounter: Secondary | ICD-10-CM | POA: Diagnosis not present

## 2021-01-13 DIAGNOSIS — R42 Dizziness and giddiness: Secondary | ICD-10-CM | POA: Diagnosis not present

## 2021-01-13 DIAGNOSIS — G35 Multiple sclerosis: Secondary | ICD-10-CM | POA: Diagnosis present

## 2021-01-13 DIAGNOSIS — G8918 Other acute postprocedural pain: Secondary | ICD-10-CM | POA: Diagnosis not present

## 2021-01-13 DIAGNOSIS — I4891 Unspecified atrial fibrillation: Secondary | ICD-10-CM | POA: Diagnosis not present

## 2021-01-13 DIAGNOSIS — I10 Essential (primary) hypertension: Secondary | ICD-10-CM | POA: Diagnosis not present

## 2021-01-13 DIAGNOSIS — Z978 Presence of other specified devices: Secondary | ICD-10-CM | POA: Diagnosis not present

## 2021-01-13 DIAGNOSIS — J189 Pneumonia, unspecified organism: Secondary | ICD-10-CM | POA: Diagnosis not present

## 2021-01-13 DIAGNOSIS — M6281 Muscle weakness (generalized): Secondary | ICD-10-CM | POA: Diagnosis not present

## 2021-01-13 DIAGNOSIS — K5909 Other constipation: Secondary | ICD-10-CM | POA: Diagnosis present

## 2021-01-13 DIAGNOSIS — T85695A Other mechanical complication of other nervous system device, implant or graft, initial encounter: Secondary | ICD-10-CM | POA: Diagnosis not present

## 2021-01-13 DIAGNOSIS — G4731 Primary central sleep apnea: Secondary | ICD-10-CM | POA: Diagnosis not present

## 2021-01-13 DIAGNOSIS — R791 Abnormal coagulation profile: Secondary | ICD-10-CM | POA: Diagnosis not present

## 2021-01-13 DIAGNOSIS — Z87891 Personal history of nicotine dependence: Secondary | ICD-10-CM | POA: Diagnosis not present

## 2021-01-13 DIAGNOSIS — Z7401 Bed confinement status: Secondary | ICD-10-CM | POA: Diagnosis not present

## 2021-01-13 DIAGNOSIS — N3001 Acute cystitis with hematuria: Secondary | ICD-10-CM | POA: Diagnosis not present

## 2021-01-13 DIAGNOSIS — I509 Heart failure, unspecified: Secondary | ICD-10-CM | POA: Diagnosis not present

## 2021-01-13 DIAGNOSIS — E559 Vitamin D deficiency, unspecified: Secondary | ICD-10-CM | POA: Diagnosis not present

## 2021-01-13 DIAGNOSIS — T85610A Breakdown (mechanical) of epidural and subdural infusion catheter, initial encounter: Secondary | ICD-10-CM | POA: Diagnosis present

## 2021-01-13 DIAGNOSIS — R140 Abdominal distension (gaseous): Secondary | ICD-10-CM | POA: Diagnosis not present

## 2021-01-13 DIAGNOSIS — B372 Candidiasis of skin and nail: Secondary | ICD-10-CM | POA: Diagnosis not present

## 2021-01-13 DIAGNOSIS — Z885 Allergy status to narcotic agent status: Secondary | ICD-10-CM | POA: Diagnosis not present

## 2021-01-13 DIAGNOSIS — R5383 Other fatigue: Secondary | ICD-10-CM | POA: Diagnosis not present

## 2021-01-13 DIAGNOSIS — L89152 Pressure ulcer of sacral region, stage 2: Secondary | ICD-10-CM | POA: Diagnosis not present

## 2021-01-13 DIAGNOSIS — Z20822 Contact with and (suspected) exposure to covid-19: Secondary | ICD-10-CM | POA: Diagnosis present

## 2021-01-13 DIAGNOSIS — G4733 Obstructive sleep apnea (adult) (pediatric): Secondary | ICD-10-CM | POA: Diagnosis present

## 2021-01-13 DIAGNOSIS — M1990 Unspecified osteoarthritis, unspecified site: Secondary | ICD-10-CM | POA: Diagnosis not present

## 2021-01-13 DIAGNOSIS — F418 Other specified anxiety disorders: Secondary | ICD-10-CM | POA: Diagnosis not present

## 2021-01-13 DIAGNOSIS — R252 Cramp and spasm: Secondary | ICD-10-CM | POA: Diagnosis present

## 2021-01-13 DIAGNOSIS — Z87440 Personal history of urinary (tract) infections: Secondary | ICD-10-CM | POA: Diagnosis not present

## 2021-01-13 DIAGNOSIS — R5381 Other malaise: Secondary | ICD-10-CM | POA: Diagnosis not present

## 2021-01-13 DIAGNOSIS — M62838 Other muscle spasm: Secondary | ICD-10-CM | POA: Diagnosis not present

## 2021-01-13 DIAGNOSIS — I4821 Permanent atrial fibrillation: Secondary | ICD-10-CM | POA: Diagnosis present

## 2021-01-13 DIAGNOSIS — M25511 Pain in right shoulder: Secondary | ICD-10-CM | POA: Diagnosis not present

## 2021-01-13 DIAGNOSIS — Z743 Need for continuous supervision: Secondary | ICD-10-CM | POA: Diagnosis not present

## 2021-01-13 DIAGNOSIS — K219 Gastro-esophageal reflux disease without esophagitis: Secondary | ICD-10-CM | POA: Diagnosis not present

## 2021-01-13 DIAGNOSIS — K5900 Constipation, unspecified: Secondary | ICD-10-CM | POA: Diagnosis not present

## 2021-01-13 LAB — PROTIME-INR
INR: 2.8 — ABNORMAL HIGH (ref 0.8–1.2)
Prothrombin Time: 29.7 seconds — ABNORMAL HIGH (ref 11.4–15.2)

## 2021-01-13 MED ORDER — BACLOFEN 20 MG PO TABS: 20.0000 mg | ORAL_TABLET | Freq: Four times a day (QID) | ORAL | 0 refills | Status: DC | PRN

## 2021-01-13 MED ORDER — WARFARIN SODIUM 2.5 MG PO TABS: 2.5000 mg | ORAL_TABLET | ORAL | Status: DC

## 2021-01-13 MED ORDER — WARFARIN SODIUM 5 MG PO TABS: 5.0000 mg | ORAL_TABLET | ORAL | Status: DC

## 2021-01-13 MED ORDER — DILTIAZEM HCL ER COATED BEADS 120 MG PO CP24: 120.0000 mg | ORAL_CAPSULE | Freq: Every day | ORAL | 1 refills | Status: DC

## 2021-01-13 NOTE — Discharge Summary (Addendum)
Physician Discharge Summary  Toni [name] YTK:[id number] DOB: 1946-04-27 DOA: 01/03/2021  PCP: Prince Solian, MD  Admit date: 01/03/2021   Discharge date: 01/13/2021  Admitted From: Home.  Disposition: SNF [facility].  Recommendations for Outpatient Follow-up:  1. Follow up with PCP in 1-2 weeks. 2. Please obtain BMP/CBC in one week. 3. Advised to follow-up with Neurology and Neurosurgeon as scheduled.  Home Health:None Equipment/Devices:None  Discharge Condition: Stable CODE STATUS:Full code Diet recommendation: Heart Healthy   Brief Summary / Hospital Course: This 75 y.o.femalewithhistory of multiple sclerosis largely bedbound, A. fib on Coumadin , hypothyroidism, depression, probable neurogenic bladder s/p chronic indwelling foleywho was brought to the ER with blood pressure downtrending and low-grade temperature, 99 degrees. She was recently seen by her neurologist and her baclofen pump was adjusted for a higher dose and also refilled(11/30/20).She has history of recurrent UTIs and follows with urology and is being evaluated currently for possible suprapubic catheter placement. She has been on multiple antibiotic courses and most recently states that she was started on chronic suppressive antibiotic therapy (she thinks Keflex). She was admitted for her bladder spasms and concern for recurrent UTI. She was started on Rocephin on admission. IV Ativan has worked for her ongoing bladder spasms. Her case was also discussed with neuro on admission (see H&P; per Dr. Leonie Man, if no improvement in spasms after UTI treatment then neuro eval can be pursued).She's improved somewhat since admission, though still having worse spasms than at home. Case discussed with neurosurgery who recommended MRI C/T spine which were without new concerning findings. Plan is for outpatient follow up with neurosurgery. Continuing to titrate baclofen Po at this time due to concern for baclofen  pump malfunctioning. Her foley has been discontinued, due to continued dysuria, a repeat UA has been sent.  This  patient was admitted for bladder spasms and was treated for UTI.  Neurology was consulted, recommended oral baclofen.  Medications adjusted and her spasm has improved.  Urine cultures grew Pseudomonas and Enterococcus faecalis.  she was given fosfomycin.  Foley was discontinued.  She  still has some urinary burning.  Repeat urine cultures no growth.  Patient muscle cramps has improved.  Patient feels better and want to be discharged to skilled nursing facility.  She was managed for below problems during hospitalization.  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 Most likely etiology could be secondary to UTI/ MS related/baclofen pump malfunction. S/p treatment for UTI. Continue oxybutynin, prn ativan, prn baclofen. Continue to follow symptoms with titration of meds.  MS  Worsening muscle spasms. Concern for baclofen pump malfunction - appreciate neurosurgery recommendations. Follow MRI C/T spine (C spine with no change since 2020 )  Discussed imaging with Dr. Zada Finders, recommended outpatient follow up with Dr. Vertell Limber (discussed with Dr. Vertell Limber as well, who is ok with outpatient follow up) Given concern for pump malfunction, will increase prn baclofen frequency to 20 mg QID prn and follow symptoms Continue xanax and ativan (PRN; the xanax is a home med) Continue PRN baclofen, gabapentin, baclofen pump She takes ACTHAR as  needed  outpatient, does this relatively frequently? Per outpatient neurologist.  Enterococcus Faecalis and Pseudomonas UTI  Indwelling Foley Catheter S/p fosfomycin Foley was discontinued (it was placed to allow labial wound to heal) Follow up culture (pansensitive enterococcus, pseudomonas sensitive to ceftaz, imipenem, zosyn) Foley was discontinued, she's still having dysuria,Repeat UA shows nitrite +,  blood cultures no growth.  Hx Labial Wound > Improved - 2/2 purewick - caution with purewick, minimize use as able  Constipation Continue current regimen  Afib: Continue diltiazem for now; hold parameters entered Diltiazem decreased to 120 mg dailyper her request, follow HR Continue coumadin per pharmacy  Central sleep apnea Continue BiPAP  Depression Continue Lexapro and Wellbutrin  Intertrigo Trial of antifungal and steroid cream given pruritis (she notes this combination helped outpatient)   Discharge Instructions  Discharge Instructions    Call MD for:  difficulty breathing, headache or visual disturbances   Complete by: As directed    Call MD for:  persistant dizziness or light-headedness   Complete by: As directed    Call MD for:  persistant nausea and vomiting   Complete by: As directed    Diet - low sodium heart healthy   Complete by: As directed    Diet Carb Modified   Complete by: As directed    Discharge instructions   Complete by: As directed    Advised to follow-up with primary care physician in 1 week. Advised to follow-up with neurology and neurosurgeon as scheduled.   Discharge wound care:   Complete by: As directed    Follow-up PCP as scheduled.   Increase activity slowly   Complete by: As directed      Allergies as of 01/13/2021      Reactions   Other Rash   Topical Surgical prep  - severe rash   Chlorhexidine Hives, Other (See Comments)   And, sores appear where applied   Hydrocodone Nausea And Vomiting, Other (See Comments)    Hydrocodone causes vomiting Hydrocodone causes vomiting   Oxycodone Nausea And Vomiting, Other (See Comments)   Oral oxycodone causes vomiting   Sertraline Hives, Swelling, Rash, Other (See Comments)   [DERM][OTHER]RASH WITH SWELLING      Medication List    STOP taking these medications   ciprofloxacin 500 MG tablet Commonly known as: CIPRO   diltiazem 60 MG tablet Commonly known as: CARDIZEM   lactulose (encephalopathy) 10 GM/15ML Soln Commonly known as: Enulose     TAKE these medications   acetaminophen 500 MG tablet Commonly known as: TYLENOL Take 500-1,000 mg by mouth every 6 (six) hours as needed for fever or headache (or pain).   Acthar 80 UNIT/ML injectable gel Generic drug: corticotropin 10 days of 80 mg daily- one course - What changed:   how much to take  how to take this  when to take this  additional instructions   ALPRAZolam 0.5 MG tablet Commonly known as: XANAX TAKE 1 TABLET BY MOUTH 3  TIMES DAILY AS NEEDED FOR  ANXIETY AND SLEEP What changed: See the new instructions.   baclofen 20 MG tablet Commonly known as: LIORESAL Take 1 tablet (20 mg total) by mouth 4 (four) times daily as needed for muscle spasms. What changed: when to take this   BIOTIN PO Take 1 tablet by mouth daily.   buPROPion 150 MG 24 hr tablet Commonly known as: WELLBUTRIN XL Take 1 tablet (150 mg total) by mouth daily.   CALCIUM + D3 PO Take 2 tablets by mouth daily.  cetirizine 10 MG tablet Commonly known as: ZYRTEC Take 10 mg by mouth daily as needed for allergies.   CO Q-10 PO Take 1 capsule by mouth daily.   diclofenac Sodium 1 % Gel Commonly known as: Voltaren Apply 2 g topically 3 (three) times daily as needed. What changed: reasons to take this   diltiazem 120 MG 24 hr capsule Commonly known as: CARDIZEM CD Take 1 capsule (120 mg total) by mouth daily. Start taking on: Jan 14, 2021 What changed:   medication strength  how much to take   docusate  sodium 100 MG capsule Commonly known as: COLACE Take 1 capsule (100 mg total) by mouth daily. What changed:   how much to take  when to take this  reasons to take this   escitalopram 10 MG tablet Commonly known as: Lexapro Take 1 tablet (10 mg total) by mouth daily.   famotidine 20 MG tablet Commonly known as: PEPCID Take 20 mg by mouth at bedtime.   FISH OIL PO Take 1 capsule by mouth daily.   furosemide 20 MG tablet Commonly known as: LASIX Take 2 tablets (40 mg total) by mouth in the morning, at noon, and at bedtime. What changed: when to take this   gabapentin 300 MG capsule Commonly known as: NEURONTIN TAKE 1 CAPSULE BY MOUTH UP  TO 4 TIMES DAILY What changed: See the new instructions.   levothyroxine 75 MCG tablet Commonly known as: SYNTHROID Take 75 mcg by mouth daily before breakfast.   linaclotide 145 MCG Caps capsule Commonly known as: LINZESS Take 1 capsule (145 mcg total) by mouth daily before breakfast.   melatonin 5 MG Tabs Take 5 mg by mouth at bedtime.   midodrine 5 MG tablet Commonly known as: PROAMATINE Take 1 tablet (5 mg total) by mouth 3 (three) times daily with meals. What changed:   when to take this  reasons to take this   multivitamin with minerals Tabs tablet Take 1 tablet by mouth daily.   omeprazole 20 MG capsule Commonly known as: PRILOSEC Take 20 mg by mouth daily before breakfast.   ondansetron 8 MG tablet Commonly known as: ZOFRAN Take 8 mg by mouth daily as needed for nausea or vomiting.   oxybutynin 5 MG tablet Commonly known as: DITROPAN Take 5 mg by mouth 3 (three) times daily as needed for bladder spasms.   potassium chloride SA 20 MEQ tablet Commonly known as: KLOR-CON Take 2 tablets (40 mEq total) by mouth daily.   psyllium 28 % packet Commonly known as: METAMUCIL SMOOTH TEXTURE Take 1 packet by mouth every evening.   simvastatin 10 MG tablet Commonly known as: ZOCOR Take 10 mg by mouth every  evening.   SYRINGE/NEEDLE (DISP) 1 ML 25G X 5/8" 1 ML Misc Commonly known as: BD Luer-Lok Syringe Use as directed to inject  Acthar   Vitamin D3 50 MCG (2000 UT) Tabs Take 2,000 Units by mouth in the morning and at bedtime.   warfarin 5 MG tablet Commonly known as: COUMADIN Take as directed. If you are unsure how to take this medication, talk to your nurse or doctor. Original instructions: TAKE 1/2 TO 1 TABLET BY  MOUTH DAILY AS DIRECTED BY  COUMADIN CLINIC What changed: See the new instructions.            Discharge Care Instructions  (From admission, onward)         Start     Ordered   01/13/21 0000  Discharge wound  care:       Comments: Follow-up PCP as scheduled.   01/13/21 1118          Follow-up Information    Avva, Ravisankar, MD Follow up in 1 week(s).   Specialty: Internal Medicine Contact information: [place] [address] [phone]        Erline Levine, MD Follow up in 1 week(s).   Specialty: Neurosurgery Contact information: [address] [place] [address] [phone]        Judith Part, MD Follow up in 1 week(s).   Specialty: Neurosurgery Contact information: [address]  [place] [address] [phone]              Allergies  Allergen Reactions  . Other Rash    Topical Surgical prep  - severe rash  . Chlorhexidine Hives and Other (See Comments)    And, sores appear where applied  . Hydrocodone Nausea And Vomiting and Other (See Comments)    Hydrocodone causes vomiting Hydrocodone causes vomiting  . Oxycodone Nausea And Vomiting and Other (See Comments)    Oral oxycodone causes vomiting  . Sertraline Hives, Swelling, Rash and Other (See Comments)    [DERM][OTHER]RASH WITH SWELLING    Consultations:  Neurology  Neurosurgery   Procedures/Studies: DG Chest 1 View  Result Date: 01/03/2021 CLINICAL DATA:  UTI EXAM: CHEST  1 VIEW COMPARISON:  11/04/2018 FINDINGS:  Hardware in the cervical spine. No focal opacity or pleural effusion. Stable cardiomediastinal silhouette. No pneumothorax. IMPRESSION: No active disease. Electronically Signed   By: Donavan Foil M.D.   On: 01/03/2021 16:24   MR CERVICAL SPINE WO CONTRAST  Result Date: 01/10/2021 CLINICAL DATA:  Multiple sclerosis.  Spasticity. EXAM: MRI CERVICAL SPINE WITHOUT CONTRAST TECHNIQUE: Multiplanar, multisequence MR imaging of the cervical spine was performed. No intravenous contrast was administered. COMPARISON:  08/04/2019 FINDINGS: Alignment: Chronic straightening and slight kyphotic curvature in the cervicothoracic spine as seen previously. Vertebrae: Solid fusion from C3 through C7. Cord: No cord compression. Chronic canal narrowing in the mid cervical region but without change. Chronic myelomalacia without change. Posterior Fossa, vertebral arteries, paraspinal tissues: Negative Disc levels: Foramen magnum is sufficiently patent. Ordinary osteoarthritis at the C1-2 articulation with mild narrowing of the canal but no compression of the upper cervical cord. C2-3: Disc bulge. Facet osteoarthritis. No compressive canal stenosis. Mild bilateral foraminal narrowing. C3 through C7: Solid union as described above. Chronic narrowing of the spinal canal, but with sufficient subarachnoid space surrounding the cord. Some myelomalacia of the cord in the mid cervical region as seen previously. C7-T1: Shallow disc protrusion with slight caudal down turning behind the superior endplate of T1. Narrowing of the ventral subarachnoid space but no compressive effect upon the cord and no foraminal extension. T1-2: Negative IMPRESSION: No change since 2020. Chronic fusion from C3 through C7. Some chronic narrowing of the canal but without ongoing cord compression. Some cord myelomalacia/volume loss in the mid cervical region as seen previously. No progressive finding. C2-3: Mild degenerative spondylosis and facet osteoarthritis with  mild bilateral foraminal narrowing, not grossly compressive. C7-T1: Shallow disc herniation with slight caudal down turning. No compressive narrowing of the canal or foramina. Electronically Signed   By: Nelson Chimes M.D.   On: 01/10/2021 12:07   MR THORACIC SPINE WO CONTRAST  Result Date: 01/10/2021 CLINICAL DATA:  History of multiple sclerosis and spasticity. Follow-up. EXAM: MRI THORACIC SPINE WITHOUT CONTRAST TECHNIQUE: Multiplanar, multisequence MR imaging of the thoracic spine was  performed. No intravenous contrast was administered. COMPARISON:  No prior thoracic MRI. FINDINGS: Alignment:  No malalignment in the thoracic region. Vertebrae: Benign appearing hemangioma within the T10 vertebral body. Benign appearing hemangioma within the left pedicle of T6. No regional fracture or other focal bone finding. Cord: No cord compression. Cord atrophy throughout the thoracic region. No evidence of focal cord lesion. Paraspinal and other soft tissues: Negative Disc levels: No thoracic disc disease is at from T1-2 through T3-4. T4-5 shows a small central disc herniation that indents the ventral subarachnoid space but does not have any affect upon the cord or show foraminal extension. T5-6 through T11-12 colon unremarkable disc spaces. Canal and foramina widely patent. Ordinary facet osteoarthritis at the T10-11 level with some foraminal encroachment on the left. T12-L1: Chronic appearing central disc herniation with slight upward migration. Narrowing of the ventral subarachnoid space but no compressive effect upon the neural structures. IMPRESSION: Thoracic cord atrophy throughout the region without evidence of cord compression or focal cord lesion. Small chronic central disc herniation at T4-5 without apparent neural compression. Chronic appearing central disc herniation with upward migration at T12-L1 without apparent neural compression. Facet and ligamentous hypertrophy at T10-11 with some foraminal narrowing on  the left. Electronically Signed   By: Nelson Chimes M.D.   On: 01/10/2021 12:12     Subjective: Patient was seen and examined at bedside.  Overnight events noted.  Patient reports feeling much improved.  She wants to be discharged to skilled nursing facility.  She still reports having dysuria but denies any pain or blood in the urine.  Discharge Exam: Vitals:   01/12/21 2032 01/13/21 0606  BP: (!) 127/49 (!) 115/52  Pulse: 73 71  Resp: 19 18  Temp: 98.2 F (36.8 C) 97.8 F (36.6 C)  SpO2: 98% 99%   Vitals:   01/12/21 1618 01/12/21 2001 01/12/21 2032 01/13/21 0606  BP: (!) 101/44  (!) 127/49 (!) 115/52  Pulse:  77 73 71  Resp: 18 15 19 18   Temp: 98.5 F (36.9 C)  98.2 F (36.8 C) 97.8 F (36.6 C)  TempSrc: Axillary  Oral Oral  SpO2: 96% 96% 98% 99%  Weight:      Height:        General: Pt is alert, awake, not in acute distress Cardiovascular: RRR, S1/S2 +, no rubs, no gallops Respiratory: CTA bilaterally, no wheezing, no rhonchi Abdominal: Soft, NT, ND, bowel sounds + Extremities: no edema, no cyanosis    The results of significant diagnostics from this hospitalization (including imaging, microbiology, ancillary and laboratory) are listed below for reference.     Microbiology: Recent Results (from the past 240 hour(s))  Culture, blood (Routine x 2)     Status: None   Collection Time: 01/03/21  3:21 PM   Specimen: Right Antecubital; Blood  Result Value Ref Range Status   Specimen Description   Final    RIGHT ANTECUBITAL BLOOD Performed at [facility], [address] [place], [place] [address]    Special Requests   Final    BOTTLES DRAWN AEROBIC AND ANAEROBIC Blood Culture adequate volume Performed at [facility] [address], [place], [place] [address]    Culture   Final    NO GROWTH 6 DAYS Performed at [facility], [place] [address] [place], [place] [address]    Report Status 01/09/2021 FINAL  Final  Culture, blood  (Routine x 2)     Status: None   Collection  Time: 01/03/21  3:26 PM   Specimen: BLOOD RIGHT HAND  Result Value Ref Range Status   Specimen Description   Final    BLOOD RIGHT HAND Performed at [facility] [address] [place], [place] [address]    Special Requests   Final    BOTTLES DRAWN AEROBIC AND ANAEROBIC Blood Culture adequate volume Performed at [facility] [address] [place], [place] [address]    Culture   Final    NO GROWTH 6 DAYS Performed at [facility], [place] [address] [place], [place] [address]    Report Status 01/09/2021 FINAL  Final  Culture, Urine     Status: Abnormal   Collection Time: 01/03/21  4:00 PM   Specimen: Urine, Random  Result Value Ref Range Status   Specimen Description   Final    URINE, RANDOM Performed at [facility] [address] [place], [place] [address]    Special Requests   Final    NONE Performed at [facility], [place] [address], [place], [place] [address]    Culture (A)  Final    >=100,000 COLONIES/mL ENTEROCOCCUS FAECALIS 20,000 COLONIES/mL PSEUDOMONAS AERUGINOSA    Report Status 01/07/2021 FINAL  Final   Organism ID, Bacteria ENTEROCOCCUS FAECALIS (A)  Final   Organism ID, Bacteria PSEUDOMONAS AERUGINOSA (A)  Final      Susceptibility   Enterococcus faecalis - MIC*    AMPICILLIN <=2 SENSITIVE Sensitive     NITROFURANTOIN <=16 SENSITIVE Sensitive     VANCOMYCIN 1 SENSITIVE Sensitive     * >=100,000 COLONIES/mL ENTEROCOCCUS FAECALIS   Pseudomonas aeruginosa - MIC*    CEFTAZIDIME 8 SENSITIVE Sensitive     CIPROFLOXACIN 2 INTERMEDIATE Intermediate     GENTAMICIN 8 INTERMEDIATE Intermediate     IMIPENEM 2 SENSITIVE Sensitive     PIP/TAZO 16 SENSITIVE Sensitive     * 20,000 COLONIES/mL PSEUDOMONAS AERUGINOSA  SARS CORONAVIRUS 2 (TAT 6-24 HRS) Nasopharyngeal Nasopharyngeal Swab     Status: None   Collection Time: 01/03/21  8:57 PM    Specimen: Nasopharyngeal Swab  Result Value Ref Range Status   SARS Coronavirus 2 NEGATIVE NEGATIVE Final    Comment: (NOTE) SARS-CoV-2 target nucleic acids are NOT DETECTED.  The SARS-CoV-2 RNA is generally detectable in upper and lower respiratory specimens during the acute phase of infection. Negative results do not preclude SARS-CoV-2 infection, do not rule out co-infections with other pathogens, and should not be used as the sole basis for treatment or other patient management decisions. Negative results must be combined with clinical observations, patient history, and epidemiological information. The expected result is Negative.  Fact Sheet for Patients: SugarRoll.be  Fact Sheet for Healthcare Providers: https://www.woods-mathews.com/  This test is not yet approved or cleared by the [place] FDA and  has been authorized for detection and/or diagnosis of SARS-CoV-2 by FDA under an Emergency Use Authorization (EUA). This EUA will remain  in effect (meaning this test can be used) for the duration of the COVID-19 declaration under Se ction 564(b)(1) of the Act, 21 U.S.C. section 360bbb-3(b)(1), unless the authorization is terminated or revoked sooner.  Performed at [facility], [place] [address], [place], [place] [address]   SARS CORONAVIRUS 2 (TAT 6-24 HRS) Nasopharyngeal Nasopharyngeal Swab     Status: None   Collection Time: 01/10/21  5:40 AM   Specimen: Nasopharyngeal Swab  Result Value Ref Range Status   SARS Coronavirus 2 NEGATIVE  NEGATIVE Final    Comment: (NOTE) SARS-CoV-2 target nucleic acids are NOT DETECTED.  The SARS-CoV-2 RNA is generally detectable in upper and lower respiratory specimens during the acute phase of infection. Negative results do not preclude SARS-CoV-2 infection, do not rule out co-infections with other pathogens, and should not be used as the sole basis for treatment or other patient  management decisions. Negative results must be combined with clinical observations, patient history, and epidemiological information. The expected result is Negative.  Fact Sheet for Patients: SugarRoll.be  Fact Sheet for Healthcare Providers: https://www.woods-mathews.com/  This test is not yet approved or cleared by the [place] FDA and  has been authorized for detection and/or diagnosis of SARS-CoV-2 by FDA under an Emergency Use Authorization (EUA). This EUA will remain  in effect (meaning this test can be used) for the duration of the COVID-19 declaration under Se ction 564(b)(1) of the Act, 21 U.S.C. section 360bbb-3(b)(1), unless the authorization is terminated or revoked sooner.  Performed at [facility], [place] [address], [place], [place] [address]   Culture, Urine     Status: None   Collection Time: 01/11/21  1:00 PM   Specimen: Urine, Catheterized  Result Value Ref Range Status   Specimen Description   Final    URINE, CATHETERIZED Performed at [place] [address] [place], [place] [address]    Special Requests   Final    NONE Performed at [facility], [place] [address], [place], [place] [address]    Culture   Final    NO GROWTH Performed at [facility], [place] [address], [place], [place] [address]    Report Status 01/12/2021 FINAL  Final     Labs: BNP (last 3 results) Recent Labs    06/07/20 0409 09/20/20 1436 11/03/20 1554  BNP 119.7* 43.3 500.9*   Basic Metabolic Panel: Recent Labs  Lab 01/07/21 0332 01/08/21 0352 01/10/21 0341 01/11/21 0339 01/12/21 0331  NA 140 140 139 138 139  K 4.1 4.1 4.1 4.0 4.1  CL 101 99 98 98 96*  CO2 35* 34* 34* 35* 35*  GLUCOSE 97 85 95 87 71  BUN 19 17 17 17 20   CREATININE 0.52 0.50 0.53 0.62 0.52  CALCIUM 8.8* 8.9 8.6* 8.9 9.0  MG 2.0 2.1 2.2 2.1 2.2  PHOS 3.9 4.1 3.7 3.8 4.6   Liver Function Tests: Recent  Labs  Lab 01/07/21 0332 01/08/21 0352 01/10/21 0341 01/11/21 0339 01/12/21 0331  AST 18 19 16 18 17   ALT 17 15 15 18 15   ALKPHOS 54 52 49 49 56  BILITOT 0.5 0.5 0.4 0.2* 0.4  PROT 6.0* 5.6* 5.7* 5.8* 5.9*  ALBUMIN 3.4* 3.3* 3.2* 3.2* 3.5   No results for input(s): LIPASE, AMYLASE in the last 168 hours. No results for input(s): AMMONIA in the last 168 hours. CBC: Recent Labs  Lab 01/07/21 0332 01/08/21 0352 01/10/21 0341 01/11/21 0920 01/12/21 0331  WBC 8.6 7.6 7.2 6.8 6.1  NEUTROABS 4.6 3.9 3.2 3.3 2.8  HGB 12.6 12.4 11.9* 13.4 13.3  HCT 38.5 37.7 36.3 40.8 40.2  MCV 96.3 95.0 95.8 96.2 96.2  PLT 173 175 167 188 186   Cardiac Enzymes: No results for input(s): CKTOTAL, CKMB, CKMBINDEX, TROPONINI in the last 168 hours. BNP: Invalid input(s): POCBNP CBG: No results for input(s): GLUCAP in the last 168 hours. D-Dimer No results for input(s): DDIMER in the last 72 hours. Hgb A1c No results for input(s):  HGBA1C in the last 72 hours. Lipid Profile No results for input(s): CHOL, HDL, LDLCALC, TRIG, CHOLHDL, LDLDIRECT in the last 72 hours. Thyroid function studies No results for input(s): TSH, T4TOTAL, T3FREE, THYROIDAB in the last 72 hours.  Invalid input(s): FREET3 Anemia work up No results for input(s): VITAMINB12, FOLATE, FERRITIN, TIBC, IRON, RETICCTPCT in the last 72 hours. Urinalysis    Component Value Date/Time   COLORURINE AMBER (A) 01/11/2021 1201   APPEARANCEUR CLEAR 01/11/2021 1201   APPEARANCEUR Clear 12/18/2014 1156   LABSPEC 1.010 01/11/2021 1201   PHURINE 6.0 01/11/2021 1201   GLUCOSEU NEGATIVE 01/11/2021 1201   [place] 01/11/2021 [place] 01/11/2021 1201   BILIRUBINUR Negative 12/18/2014 [place] 01/11/2021 1201   PROTEINUR NEGATIVE 01/11/2021 1201   UROBILINOGEN 0.2 08/05/2014 0951   NITRITE POSITIVE (A) 01/11/2021 1201   LEUKOCYTESUR NEGATIVE 01/11/2021 1201   Sepsis Labs Invalid input(s):  PROCALCITONIN,  WBC,  LACTICIDVEN Microbiology Recent Results (from the past 240 hour(s))  Culture, blood (Routine x 2)     Status: None   Collection Time: 01/03/21  3:21 PM   Specimen: Right Antecubital; Blood  Result Value Ref Range Status   Specimen Description   Final    RIGHT ANTECUBITAL BLOOD Performed at [facility], [place] [address], [place], [place] [address]    Special Requests   Final    BOTTLES DRAWN AEROBIC AND ANAEROBIC Blood Culture adequate volume Performed at [place] [address], [place], [place] [address]    Culture   Final    NO GROWTH 6 DAYS Performed at [facility], [place] [address] [place], [place] [address]    Report Status 01/09/2021 FINAL  Final  Culture, blood (Routine x 2)     Status: None   Collection Time: 01/03/21  3:26 PM   Specimen: BLOOD RIGHT HAND  Result Value Ref Range Status   Specimen Description   Final    BLOOD RIGHT HAND Performed at [place] [address], [place], [place] [address]    Special Requests   Final    BOTTLES DRAWN AEROBIC AND ANAEROBIC Blood Culture adequate volume Performed at [place] [address] [place], [place] [address]    Culture   Final    NO GROWTH 6 DAYS Performed at [facility], [place] [address], [place], [place] [address]    Report Status 01/09/2021 FINAL  Final  Culture, Urine     Status: Abnormal   Collection Time: 01/03/21  4:00 PM   Specimen: Urine, Random  Result Value Ref Range Status   Specimen Description   Final    URINE, RANDOM Performed at [place] [address] [place],  [address]    Special Requests   Final    NONE Performed at [facility], [place] [address] [place], [place] [address]    Culture (A)  Final    >=100,000 COLONIES/mL ENTEROCOCCUS FAECALIS 20,000 COLONIES/mL PSEUDOMONAS AERUGINOSA    Report Status 01/07/2021 FINAL  Final    Organism ID, Bacteria ENTEROCOCCUS FAECALIS (A)  Final   Organism ID, Bacteria PSEUDOMONAS AERUGINOSA (A)  Final      Susceptibility   Enterococcus faecalis - MIC*    AMPICILLIN <=2 SENSITIVE Sensitive     NITROFURANTOIN <=16 SENSITIVE Sensitive     VANCOMYCIN 1 SENSITIVE Sensitive     * >=100,000 COLONIES/mL ENTEROCOCCUS FAECALIS  Pseudomonas aeruginosa - MIC*    CEFTAZIDIME 8 SENSITIVE Sensitive     CIPROFLOXACIN 2 INTERMEDIATE Intermediate     GENTAMICIN 8 INTERMEDIATE Intermediate     IMIPENEM 2 SENSITIVE Sensitive     PIP/TAZO 16 SENSITIVE Sensitive     * 20,000 COLONIES/mL PSEUDOMONAS AERUGINOSA  SARS CORONAVIRUS 2 (TAT 6-24 HRS) Nasopharyngeal Nasopharyngeal Swab     Status: None   Collection Time: 01/03/21  8:57 PM   Specimen: Nasopharyngeal Swab  Result Value Ref Range Status   SARS Coronavirus 2 NEGATIVE NEGATIVE Final    Comment: (NOTE) SARS-CoV-2 target nucleic acids are NOT DETECTED.  The SARS-CoV-2 RNA is generally detectable in upper and lower respiratory specimens during the acute phase of infection. Negative results do not preclude SARS-CoV-2 infection, do not rule out co-infections with other pathogens, and should not be used as the sole basis for treatment or other patient management decisions. Negative results must be combined with clinical observations, patient history, and epidemiological information. The expected result is Negative.  Fact Sheet for Patients: SugarRoll.be  Fact Sheet for Healthcare Providers: https://www.woods-mathews.com/  This test is not yet approved or cleared by the [place] FDA and  has been authorized for detection and/or diagnosis of SARS-CoV-2 by FDA under an Emergency Use Authorization (EUA). This EUA will remain  in effect (meaning this test can be used) for the duration of the COVID-19 declaration under Se ction 564(b)(1) of the Act, 21 U.S.C. section 360bbb-3(b)(1), unless the  authorization is terminated or revoked sooner.  Performed at [facility], [place] [address], [place], [place] [address]   SARS CORONAVIRUS 2 (TAT 6-24 HRS) Nasopharyngeal Nasopharyngeal Swab     Status: None   Collection Time: 01/10/21  5:40 AM   Specimen: Nasopharyngeal Swab  Result Value Ref Range Status   SARS Coronavirus 2 NEGATIVE NEGATIVE Final    Comment: (NOTE) SARS-CoV-2 target nucleic acids are NOT DETECTED.  The SARS-CoV-2 RNA is generally detectable in upper and lower respiratory specimens during the acute phase of infection. Negative results do not preclude SARS-CoV-2 infection, do not rule out co-infections with other pathogens, and should not be used as the sole basis for treatment or other patient management decisions. Negative results must be combined with clinical observations, patient history, and epidemiological information. The expected result is Negative.  Fact Sheet for Patients: SugarRoll.be  Fact Sheet for Healthcare Providers: https://www.woods-mathews.com/  This test is not yet approved or cleared by the [place] FDA and  has been authorized for detection and/or diagnosis of SARS-CoV-2 by FDA under an Emergency Use Authorization (EUA). This EUA will remain  in effect (meaning this test can be used) for the duration of the COVID-19 declaration under Se ction 564(b)(1) of the Act, 21 U.S.C. section 360bbb-3(b)(1), unless the authorization is terminated or revoked sooner.  Performed at [facility], [place] [address], [place], [place] [address]   Culture, Urine     Status: None   Collection Time: 01/11/21  1:00 PM   Specimen: Urine, Catheterized  Result Value Ref Range Status   Specimen Description   Final    URINE, CATHETERIZED Performed at [place] [address] [place], [place] [address]    Special Requests   Final    NONE Performed at [facility], [place] [address], [place], [place] [address]    Culture   Final    NO GROWTH Performed at [facility], [place] [address]  [place], [place] [address]    Report Status 01/12/2021 FINAL  Final     Time coordinating discharge: Over 30 minutes  SIGNED:   Shawna Clamp, MD  [facility] 01/13/2021, 11:22 AM Pager   If 7PM-7AM, please contact night-coverage [email or web address]

## 2021-01-13 NOTE — Telephone Encounter (Signed)
Pt called and stated that they are being discharged from hospital and that they will be heading to [facility] but they will call from there when they get there and have a nurse speak w/us if they need inr orders will route to kristin alvstad

## 2021-01-13 NOTE — Progress Notes (Signed)
ANTICOAGULATION CONSULT NOTE - follow up  Pharmacy Consult for warfarin Indication: atrial fibrillation  Allergies  Allergen Reactions  . Other Rash    Topical Surgical prep  - severe rash  . Chlorhexidine Hives and Other (See Comments)    And, sores appear where applied  . Hydrocodone Nausea And Vomiting and Other (See Comments)    Hydrocodone causes vomiting Hydrocodone causes vomiting  . Oxycodone Nausea And Vomiting and Other (See Comments)    Oral oxycodone causes vomiting  . Sertraline Hives, Swelling, Rash and Other (See Comments)    [DERM][OTHER]RASH WITH SWELLING    Patient Measurements: Height: 5\' 2"  (157.5 cm) Weight: 83 kg (182 lb 15.7 oz) IBW/kg (Calculated) : 50.1   Vital Signs: Temp: 97.8 F (36.6 C) (05/26 0606) Temp Source: Oral (05/26 0606) BP: 115/52 (05/26 0606) Pulse Rate: 71 (05/26 0606)  Labs: Recent Labs    01/11/21 0339 01/11/21 0920 01/12/21 0331 01/13/21 0328  HGB  --  13.4 13.3  --   HCT  --  40.8 40.2  --   PLT  --  188 186  --   LABPROT 31.4*  --  28.5* 29.7*  INR 3.0*  --  2.7* 2.8*  CREATININE 0.62  --  0.52  --     Estimated Creatinine Clearance: 62.6 mL/min (by C-G formula based on SCr of 0.52 mg/dL).   Assessment: Pt is a 70 yoF who takes warfarin PTA for atrial fibrillation. Pharmacy consulted to dose warfarin inpatient.   Home dose: 2.5mg  on Mondays and Thursdays, 5mg  all other days. Last dose PTA: 5/16  Today, 01/13/21  INR therapeutic today @ 2.8   CBC (5/25): WNL, stable  No bleeding reported  Diet: Heart healthy; meal intake not charted  DDI: None significant  Goal of Therapy:  INR 2-3  Plan:   Resume home dose 2.5 mg Mon/Thurs, 5 mg all other days  Daily INR, CBC q48  Monitor for s/s bleeding or thrombosis  At this time, would recommend continuation of home dose at discharge.   Eudelia Bunch, Pharm.D 01/13/2021 9:27 AM

## 2021-01-13 NOTE — Discharge Instructions (Signed)
Advised to follow-up with primary care physician in 1 week. Advised to follow-up with neurology and neurosurgery is a scheduled. Patient's Cardizem was reduced to 120 mg daily.

## 2021-01-13 NOTE — Progress Notes (Signed)
Verbal report given to Journalist, newspaper at [employer]. All questions were answered.

## 2021-01-13 NOTE — TOC Transition Note (Signed)
Transition of Care [facility]) - CM/SW Discharge Note   Patient Details  Name: Toni [name] MRN: [id number] Date of Birth: 1946/02/03  [facility] CM/SW Contact:  Ross Ludwig, LCSW Phone Number: 01/13/2021, 1:22 PM   Clinical Narrative:     Patient to be d/c'ed today to [facility] room 63.  Patient and family agreeable to plans will transport via ems RN to call report to [phone].  CSW updated patient's daughter [name] about planned discharge for today.     Final next level of care: [place] Barriers to Discharge: Barriers Resolved   Patient Goals and CMS Choice Patient states their goals for this hospitalization and ongoing recovery are:: To go to [facility] SNF CMS Medicare.gov Compare Post Acute Care list provided to:: Patient Represenative (must comment) Choice offered to / list presented to : Adult Children  Discharge Placement   Existing PASRR number confirmed : 01/07/21          Patient chooses bed at: [facility] Patient to be transferred to facility by: [place] Name of family member notified: Daughter [name] is aware of planned disharge for today. Patient and family notified of of transfer: 01/13/21  Discharge Plan and Services     Post Acute Care Choice: [place]                               Social Determinants of Health (SDOH) Interventions     Readmission Risk Interventions Readmission Risk Prevention Plan 06/11/2020  Transportation Screening Complete  PCP or Specialist appointment within 3-5 days of discharge Not Complete  PCP/Specialist Appt Not Complete comments MD office reports they have to call the pt and schedule this appt themselves when they receive notice of [place] or [place] Complete  SW Recovery Care/Counseling Consult Complete  [place] Not Applicable  Some recent data might be hidden

## 2021-01-14 ENCOUNTER — Telehealth: Payer: Self-pay | Admitting: *Deleted

## 2021-01-14 NOTE — Telephone Encounter (Signed)
Called and left the patient a message to call the office back. Patient needs to be scheduled for a follow in Dec with Dr Berline Lopes

## 2021-01-18 ENCOUNTER — Telehealth: Payer: Self-pay | Admitting: Orthopaedic Surgery

## 2021-01-18 ENCOUNTER — Telehealth: Payer: Self-pay

## 2021-01-18 ENCOUNTER — Encounter: Payer: Self-pay | Admitting: Orthopaedic Surgery

## 2021-01-18 NOTE — Telephone Encounter (Signed)
That will be fine. 

## 2021-01-18 NOTE — Telephone Encounter (Signed)
Called and spoke w/pt who stated that the [facility] is managing coumadin and when they are discharged she will call and we will resume

## 2021-01-18 NOTE — Telephone Encounter (Signed)
She is weight bearing as tolerated.  They can use a sit to stand lift if needed.

## 2021-01-18 NOTE — Telephone Encounter (Signed)
Juliann Pulse with [facility] called checking to see if the pt had a sit to stand order? Juliann Pulse would like to have the office notes from the pts appt on 12/08/20 faxed over.   Juliann Pulse CB# [phone] Fax# [phone]* to the attention of Juliann Pulse

## 2021-01-18 NOTE — Telephone Encounter (Signed)
Faxed to provided number  

## 2021-01-18 NOTE — Telephone Encounter (Signed)
Estill Bamberg from [employer] called she is requesting clarification regarding patients weight bearing status specific also she is requesting clerificatrion method of transfer can the patient use a sit to stand lift due to bil shoulder liminations and right ankle weight limit if there is one fax:[phone] attention Estill Bamberg in rehab call back:[phone] ext [phone]

## 2021-01-19 ENCOUNTER — Telehealth: Payer: Self-pay | Admitting: *Deleted

## 2021-01-19 ENCOUNTER — Telehealth: Payer: Self-pay | Admitting: Orthopaedic Surgery

## 2021-01-19 DIAGNOSIS — M542 Cervicalgia: Secondary | ICD-10-CM | POA: Diagnosis not present

## 2021-01-19 DIAGNOSIS — G4700 Insomnia, unspecified: Secondary | ICD-10-CM | POA: Diagnosis not present

## 2021-01-19 DIAGNOSIS — K5900 Constipation, unspecified: Secondary | ICD-10-CM | POA: Diagnosis not present

## 2021-01-19 DIAGNOSIS — G35 Multiple sclerosis: Secondary | ICD-10-CM | POA: Diagnosis not present

## 2021-01-19 DIAGNOSIS — N390 Urinary tract infection, site not specified: Secondary | ICD-10-CM | POA: Diagnosis not present

## 2021-01-19 DIAGNOSIS — M1990 Unspecified osteoarthritis, unspecified site: Secondary | ICD-10-CM | POA: Diagnosis not present

## 2021-01-19 DIAGNOSIS — G629 Polyneuropathy, unspecified: Secondary | ICD-10-CM | POA: Diagnosis not present

## 2021-01-19 DIAGNOSIS — K219 Gastro-esophageal reflux disease without esophagitis: Secondary | ICD-10-CM | POA: Diagnosis not present

## 2021-01-19 DIAGNOSIS — E785 Hyperlipidemia, unspecified: Secondary | ICD-10-CM | POA: Diagnosis not present

## 2021-01-19 NOTE — Telephone Encounter (Signed)
Spoke with the patient and she stated "Once I'm home and out of rehab I will call back to schedule an appt in Dec"

## 2021-01-19 NOTE — Telephone Encounter (Signed)
Patient aware I will again re-fax

## 2021-01-19 NOTE — Telephone Encounter (Signed)
Patient called. She would like Caryl Pina to call her. [phone]. She needs something for PT to say what her WB status is. She can not start PT until they have the RX for her to use the sit to stand lift.

## 2021-01-20 ENCOUNTER — Telehealth: Payer: Self-pay | Admitting: Orthopaedic Surgery

## 2021-01-20 DIAGNOSIS — G8929 Other chronic pain: Secondary | ICD-10-CM | POA: Diagnosis not present

## 2021-01-20 DIAGNOSIS — I4891 Unspecified atrial fibrillation: Secondary | ICD-10-CM | POA: Diagnosis not present

## 2021-01-20 DIAGNOSIS — M542 Cervicalgia: Secondary | ICD-10-CM | POA: Diagnosis not present

## 2021-01-20 DIAGNOSIS — E785 Hyperlipidemia, unspecified: Secondary | ICD-10-CM | POA: Diagnosis not present

## 2021-01-20 DIAGNOSIS — I509 Heart failure, unspecified: Secondary | ICD-10-CM | POA: Diagnosis not present

## 2021-01-20 DIAGNOSIS — M1990 Unspecified osteoarthritis, unspecified site: Secondary | ICD-10-CM | POA: Diagnosis not present

## 2021-01-20 DIAGNOSIS — R5381 Other malaise: Secondary | ICD-10-CM | POA: Diagnosis not present

## 2021-01-20 DIAGNOSIS — G629 Polyneuropathy, unspecified: Secondary | ICD-10-CM | POA: Diagnosis not present

## 2021-01-20 DIAGNOSIS — E559 Vitamin D deficiency, unspecified: Secondary | ICD-10-CM | POA: Diagnosis not present

## 2021-01-20 NOTE — Telephone Encounter (Signed)
Re-faxed AGAIN

## 2021-01-20 NOTE — Telephone Encounter (Signed)
Fax # [phone] - Estill Bamberg called regarding pts appt today with PT she just needed a little more clarification about weigh bearing status, is it as tolerated or what ?

## 2021-01-20 NOTE — Telephone Encounter (Signed)
Yes.  They can attempt weightbearing as tolerated.

## 2021-01-21 NOTE — Telephone Encounter (Signed)
Err

## 2021-01-26 ENCOUNTER — Telehealth: Payer: Self-pay | Admitting: Neurology

## 2021-01-26 DIAGNOSIS — H04123 Dry eye syndrome of bilateral lacrimal glands: Secondary | ICD-10-CM | POA: Diagnosis not present

## 2021-01-26 DIAGNOSIS — G629 Polyneuropathy, unspecified: Secondary | ICD-10-CM | POA: Diagnosis not present

## 2021-01-26 DIAGNOSIS — M1990 Unspecified osteoarthritis, unspecified site: Secondary | ICD-10-CM | POA: Diagnosis not present

## 2021-01-26 DIAGNOSIS — M25511 Pain in right shoulder: Secondary | ICD-10-CM | POA: Diagnosis not present

## 2021-01-26 DIAGNOSIS — R42 Dizziness and giddiness: Secondary | ICD-10-CM | POA: Diagnosis not present

## 2021-01-26 DIAGNOSIS — M542 Cervicalgia: Secondary | ICD-10-CM | POA: Diagnosis not present

## 2021-01-26 DIAGNOSIS — N390 Urinary tract infection, site not specified: Secondary | ICD-10-CM | POA: Diagnosis not present

## 2021-01-26 DIAGNOSIS — K5900 Constipation, unspecified: Secondary | ICD-10-CM | POA: Diagnosis not present

## 2021-01-26 DIAGNOSIS — G35 Multiple sclerosis: Secondary | ICD-10-CM | POA: Diagnosis not present

## 2021-01-26 NOTE — Telephone Encounter (Signed)
I called the patient.  The patient is in [facility] rehab facility after a bad bladder infection.  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  She will be getting an appoint with Dr. Vertell Limber to consider getting the baclofen pump replaced, she is on 20 mg 4 times daily of oral baclofen and still requiring IM shots of Ativan for spasticity.

## 2021-01-26 NOTE — Telephone Encounter (Signed)
Pt is asking for a call from [place],RN to discuss MS spasms she is having

## 2021-01-28 ENCOUNTER — Other Ambulatory Visit: Payer: Self-pay | Admitting: Neurosurgery

## 2021-01-28 ENCOUNTER — Telehealth: Payer: Self-pay | Admitting: *Deleted

## 2021-01-28 DIAGNOSIS — G35 Multiple sclerosis: Secondary | ICD-10-CM | POA: Diagnosis not present

## 2021-01-28 DIAGNOSIS — Z978 Presence of other specified devices: Secondary | ICD-10-CM | POA: Diagnosis not present

## 2021-01-28 DIAGNOSIS — T85610A Breakdown (mechanical) of epidural and subdural infusion catheter, initial encounter: Secondary | ICD-10-CM | POA: Diagnosis not present

## 2021-01-28 NOTE — Telephone Encounter (Signed)
   [facility] Pre-operative Risk Assessment    Patient Name: Toni [name]  DOB: 11/15/1945  MRN: [id number]   Request for surgical clearance:  What type of surgery is being performed? BACLOFEN PUMP REPLACEMENT, POSSIBLE REVISION OF CATHETER    When is this surgery scheduled? TBD   What type of clearance is required (medical clearance vs. Pharmacy clearance to hold med vs. Both)? BOTH  Are there any medications that need to be held prior to surgery and how long?COUMADIN    Practice name and name of physician performing surgery? [facility] DR Erline Levine    What is the office phone number? [phone]   7.   What is the office fax number? [phone]   8.   Anesthesia type (None, local, MAC, general) ?

## 2021-01-31 ENCOUNTER — Telehealth: Payer: Self-pay | Admitting: Neurology

## 2021-01-31 ENCOUNTER — Encounter: Payer: Self-pay | Admitting: Orthopaedic Surgery

## 2021-01-31 NOTE — Telephone Encounter (Signed)
   Name: Toni [name]  DOB: [birth year]  MRN: [id number]  Primary Cardiologist: Pixie Casino, MD  Chart reviewed as part of pre-operative protocol coverage. Because of Jessye Imhoff Dement's past medical history and time since last visit, she will require a follow-up visit in order to better assess preoperative cardiovascular risk.  Pre-op covering staff: -Please add "pre-op clearance" to the appointment notes so provider is aware. For her upcoming appointment with Dr. Debara Pickett 02/23/21.   If applicable, this message will also be routed to pharmacy pool and/or primary cardiologist for input on holding anticoagulant/antiplatelet agent as requested below so that this information is available to the clearing provider at time of patient's appointment.   Kathyrn Drown, NP  01/31/2021, 8:13 AM

## 2021-01-31 NOTE — Telephone Encounter (Signed)
Pt called wanting to inform provider that she will be cx her pump apt due to getting a pump replacement surgery on June 23rd. If any questions please reach out to pt at rehab facility.

## 2021-01-31 NOTE — Telephone Encounter (Signed)
Ok to hold warfarin 5 days prior to procedure - restart day after. Will follow-up in the office in July.  Dr Debara Pickett

## 2021-01-31 NOTE — Telephone Encounter (Signed)
Pt has appt 02/23/21 with cardiologist Dr. Debara Pickett. Have added pre op clearance needed to appt notes. Will forward clearance notes to MD for upcoming appt. Will send FYI to surgeon's office pt has appt 02/23/21.

## 2021-01-31 NOTE — Telephone Encounter (Signed)
Patient with diagnosis of AF on warfarin for anticoagulation.    Procedure: Baclofen pump replacement and possible revision of catheter  Date of procedure: 02/10/21   CHA2DS2-VASc Score = 6  This indicates a 9.7% annual risk of stroke. The patient's score is based upon: CHF History: Yes HTN History: Yes Diabetes History: Yes Stroke History: No Vascular Disease History: Yes Age Score: 1 Gender Score: 1     CrCl 96 Platelet count 186  Per office protocol, patient can hold warfarin for 5 days prior to procedure.   Patient will not need bridging with Lovenox (enoxaparin) around procedure.

## 2021-01-31 NOTE — Telephone Encounter (Signed)
Events noted.  The patient will need to let us know when the next pump refill is due.

## 2021-02-02 DIAGNOSIS — E559 Vitamin D deficiency, unspecified: Secondary | ICD-10-CM | POA: Diagnosis not present

## 2021-02-02 DIAGNOSIS — M1990 Unspecified osteoarthritis, unspecified site: Secondary | ICD-10-CM | POA: Diagnosis not present

## 2021-02-02 DIAGNOSIS — I4891 Unspecified atrial fibrillation: Secondary | ICD-10-CM | POA: Diagnosis not present

## 2021-02-02 DIAGNOSIS — G35 Multiple sclerosis: Secondary | ICD-10-CM | POA: Diagnosis not present

## 2021-02-02 DIAGNOSIS — K5900 Constipation, unspecified: Secondary | ICD-10-CM | POA: Diagnosis not present

## 2021-02-02 DIAGNOSIS — G629 Polyneuropathy, unspecified: Secondary | ICD-10-CM | POA: Diagnosis not present

## 2021-02-02 DIAGNOSIS — N390 Urinary tract infection, site not specified: Secondary | ICD-10-CM | POA: Diagnosis not present

## 2021-02-02 DIAGNOSIS — M25512 Pain in left shoulder: Secondary | ICD-10-CM | POA: Diagnosis not present

## 2021-02-02 DIAGNOSIS — M25511 Pain in right shoulder: Secondary | ICD-10-CM | POA: Diagnosis not present

## 2021-02-02 DIAGNOSIS — G8929 Other chronic pain: Secondary | ICD-10-CM | POA: Diagnosis not present

## 2021-02-07 DIAGNOSIS — I509 Heart failure, unspecified: Secondary | ICD-10-CM | POA: Diagnosis not present

## 2021-02-07 DIAGNOSIS — I4891 Unspecified atrial fibrillation: Secondary | ICD-10-CM | POA: Diagnosis not present

## 2021-02-07 DIAGNOSIS — E785 Hyperlipidemia, unspecified: Secondary | ICD-10-CM | POA: Diagnosis not present

## 2021-02-07 DIAGNOSIS — G35 Multiple sclerosis: Secondary | ICD-10-CM | POA: Diagnosis not present

## 2021-02-07 DIAGNOSIS — R059 Cough, unspecified: Secondary | ICD-10-CM | POA: Diagnosis not present

## 2021-02-07 DIAGNOSIS — G4700 Insomnia, unspecified: Secondary | ICD-10-CM | POA: Diagnosis not present

## 2021-02-07 DIAGNOSIS — R300 Dysuria: Secondary | ICD-10-CM | POA: Diagnosis not present

## 2021-02-08 ENCOUNTER — Ambulatory Visit: Payer: Self-pay | Admitting: Neurology

## 2021-02-08 ENCOUNTER — Other Ambulatory Visit: Payer: Self-pay | Admitting: Gastroenterology

## 2021-02-09 ENCOUNTER — Ambulatory Visit: Payer: Medicare Other | Admitting: Neurology

## 2021-02-09 ENCOUNTER — Encounter (HOSPITAL_COMMUNITY): Payer: Self-pay | Admitting: Neurosurgery

## 2021-02-09 ENCOUNTER — Other Ambulatory Visit: Payer: Self-pay

## 2021-02-09 DIAGNOSIS — G4700 Insomnia, unspecified: Secondary | ICD-10-CM | POA: Diagnosis not present

## 2021-02-09 DIAGNOSIS — G4733 Obstructive sleep apnea (adult) (pediatric): Secondary | ICD-10-CM | POA: Diagnosis not present

## 2021-02-09 DIAGNOSIS — N390 Urinary tract infection, site not specified: Secondary | ICD-10-CM | POA: Diagnosis not present

## 2021-02-09 DIAGNOSIS — K5900 Constipation, unspecified: Secondary | ICD-10-CM | POA: Diagnosis not present

## 2021-02-09 DIAGNOSIS — E039 Hypothyroidism, unspecified: Secondary | ICD-10-CM | POA: Diagnosis not present

## 2021-02-09 DIAGNOSIS — E785 Hyperlipidemia, unspecified: Secondary | ICD-10-CM | POA: Diagnosis not present

## 2021-02-09 DIAGNOSIS — I509 Heart failure, unspecified: Secondary | ICD-10-CM | POA: Diagnosis not present

## 2021-02-09 DIAGNOSIS — I4891 Unspecified atrial fibrillation: Secondary | ICD-10-CM | POA: Diagnosis not present

## 2021-02-09 DIAGNOSIS — R300 Dysuria: Secondary | ICD-10-CM | POA: Diagnosis not present

## 2021-02-09 DIAGNOSIS — M1990 Unspecified osteoarthritis, unspecified site: Secondary | ICD-10-CM | POA: Diagnosis not present

## 2021-02-09 DIAGNOSIS — R059 Cough, unspecified: Secondary | ICD-10-CM | POA: Diagnosis not present

## 2021-02-09 NOTE — Progress Notes (Signed)
Patient resides at [facility], spoke with Kym Groom [phone] for PAT information and instructions for DOS-faxed.  PCP - Dr Elijio Miles Cardiologist - n/a  Chest x-ray - 01/03/21 (1V) EKG - 11/03/20 Stress Test - 06/21/10 ECHO - 06/10/20 Cardiac Cath - 06/22/10  Sleep Study -  Yes CPAP - uses cpap  Fasting Blood Sugar - unknown, does not check Checks Blood Sugar 0 times a day DM 2 - diet controlled, no meds  Anesthesia review: Yes  STOP now taking any Aspirin (unless otherwise instructed by your surgeon), Aleve, Naproxen, Ibuprofen, Motrin, Advil, Goody's, BC's, all herbal medications, fish oil, and all vitamins.   Coronavirus Screening Covid test is scheduled on DOS Do you have any of the following symptoms:  Cough yes/no: No Fever (>100.9F)  yes/no: No Runny nose yes/no: No Sore throat yes/no: No Difficulty breathing/shortness of breath  yes/no: No  Have you traveled in the last 14 days and where? yes/no: No  Juliann Pulse verbalized understanding of instructions that were given via phone and faxed to [phone].

## 2021-02-09 NOTE — Progress Notes (Signed)
Surgical Instructions - Toni [name])    Your procedure is scheduled on 02/10/21.  Report to [facility] Main Entrance "A" at 5:30 A.M., then check in with the Admitting office.  Call this number if you have problems the morning of surgery:  [phone]   If you have any questions prior to your surgery date call [phone]: Open Monday-Friday 8am-4pm    Remember:  Do not eat or drink after midnight tonight.     Take these medicines the morning of surgery with A SIP OF WATER: cephALEXin (KEFLEX)  diltiazem (DILACOR XR) gabapentin (NEURONTIN)  omeprazole (PRILOSEC)   acetaminophen (TYLENOL) PRN ALPRAZolam (XANAX) PRN baclofen (LIORESAL) PRN cetirizine (ZYRTEC) 10 PRN levothyroxine (SYNTHROID, LEVOTHROID)  LORazepam (ATIVAN) PRN midodrine (PROAMATINE) PRN ondansetron (ZOFRAN) PRN oxybutynin (DITROPAN) PRN   As of today, STOP taking any Aspirin (unless otherwise instructed by your surgeon) Aleve, Naproxen, Ibuprofen, Motrin, Advil, Goody's, BC's, all herbal medications, fish oil, and all vitamins.          Do not wear jewelry or makeup Do not wear lotions, powders, perfumes or deodorant. Do not shave 48 hours prior to surgery.   Do not bring valuables to the hospital. DO Not wear nail polish, gel polish, artificial nails, or any other type of covering on natural nails including finger and toenails. If patients have artificial nails, gel coating, etc. that need to be removed by a nail salon please have this removed prior to surgery or surgery may need to be canceled/delayed if the surgeon/ anesthesia feels like the patient is unable to be adequately monitored.             [facility] is not responsible for any belongings or valuables.  Do NOT Smoke (Tobacco/Vaping) or drink Alcohol 24 hours prior to your procedure If you use a CPAP at night, you may bring all equipment for your overnight stay.   Contacts, glasses, dentures or bridgework may not be worn into  surgery, please bring cases for these belongings   For patients admitted to the hospital, discharge time will be determined by your treatment team.   ONLY 1 SUPPORT PERSON MAY BE PRESENT WHILE YOU ARE IN SURGERY. IF YOU ARE TO BE ADMITTED ONCE YOU ARE IN YOUR ROOM YOU WILL BE ALLOWED TWO (2) VISITORS.    Special instructions:    Oral Hygiene is also important to reduce your risk of infection.  Remember - BRUSH YOUR TEETH THE MORNING OF SURGERY WITH YOUR REGULAR TOOTHPASTE

## 2021-02-09 NOTE — Anesthesia Preprocedure Evaluation (Addendum)
Anesthesia Evaluation  Patient identified by MRN, date of birth, ID band Patient awake    Reviewed: Allergy & Precautions, NPO status , Patient's Chart, lab work & pertinent test results  Airway Mallampati: II  TM Distance: >3 FB     Dental   Pulmonary asthma , sleep apnea , pneumonia, former smoker,    breath sounds clear to auscultation       Cardiovascular hypertension, +CHF  + dysrhythmias Atrial Fibrillation  Rhythm:Regular Rate:Normal     Neuro/Psych  Neuromuscular disease    GI/Hepatic Neg liver ROS, GERD  ,  Endo/Other  diabetesHypothyroidism   Renal/GU negative Renal ROS     Musculoskeletal  (+) Arthritis ,   Abdominal   Peds  Hematology   Anesthesia Other Findings   Reproductive/Obstetrics                            Anesthesia Physical Anesthesia Plan  ASA: 3  Anesthesia Plan: General   Post-op Pain Management:    Induction: Intravenous  PONV Risk Score and Plan:   Airway Management Planned: Oral ETT  Additional Equipment:   Intra-op Plan:   Post-operative Plan: Extubation in OR  Informed Consent: I have reviewed the patients History and Physical, chart, labs and discussed the procedure including the risks, benefits and alternatives for the proposed anesthesia with the patient or authorized representative who has indicated his/her understanding and acceptance.     Dental advisory given  Plan Discussed with: CRNA and Anesthesiologist  Anesthesia Plan Comments: (PAT note by Karoline Caldwell, PA-C: Pertinent history includes multiple sclerosis, permanent atrial fibrillation on Coumadin, OSA on BiPAP, neurogenic bladder.  She is followed by cardiologist Dr. Debara Pickett.  Last seen 09/20/2020.  Discussed that she was still having prolonged symptoms after COVID-19.  Noted to be hypoxemic on BiPAP and suggested she would likely need oxygen at night.  Recommended she follow-up with  her sleep specialist.  Advised 35-month follow-up with cardiology.  Regarding upcoming surgery, Dr. Debara Pickett commented in telephone encounter dated 01/31/2021, "Ok to hold warfarin 5 days prior to procedure - restart day after. Will follow-up in the office in July."  Recent admission May 2022 for bladder spasms and concern for recurrent UTI.  She was treated with antibiotics based on cultures.  Spasms improved somewhat on IV Ativan, but there was concern for malfunctioning baclofen pump.  Neurosurgery was consulted and recommended outpatient follow-up to evaluate baclofen pump.  It was noted during his admission that due to progressive MS she is essentially bedbound.  Respiratory therapy notes, she was placed on 5 L supplemental oxygen nightly.  History of cervical fusion C3-C7.  Labs from 01/12/2021 reviewed, unremarkable.  EKG 01/05/2021: Atrial fibrillation.  Rate 77.  CHEST 1 VIEW 01/03/2021: COMPARISON: 11/04/2018  FINDINGS: Hardware in the cervical spine. No focal opacity or pleural effusion. Stable cardiomediastinal silhouette. No pneumothorax.  IMPRESSION: No active disease.  TTE 06/10/2020: 1. Left ventricular ejection fraction, by estimation, is 55 to 60%. The  left ventricle has normal function. The left ventricle has no regional  wall motion abnormalities. The left ventricular internal cavity size was  mildly dilated. Left ventricular  diastolic function could not be evaluated.  2. Right ventricular systolic function is normal. The right ventricular  size is normal. There is normal pulmonary artery systolic pressure. The  estimated right ventricular systolic pressure is 53.7 mmHg.  3. The mitral valve is normal in structure. Mild mitral valve  regurgitation. No  evidence of mitral stenosis.  4. The aortic valve is normal in structure. Aortic valve regurgitation is  not visualized. No aortic stenosis is present.  5. The inferior vena cava is dilated in size with <50%  respiratory  variability, suggesting right atrial pressure of 15 mmHg.  )       Anesthesia Quick Evaluation

## 2021-02-09 NOTE — Progress Notes (Signed)
Anesthesia Chart Review: Same-day work-up  Pertinent history includes multiple sclerosis, permanent atrial fibrillation on Coumadin, OSA on BiPAP, neurogenic bladder.  She is followed by cardiologist Dr. Debara Pickett.  Last seen 09/20/2020.  Discussed that she was still having prolonged symptoms after COVID-19.  Noted to be hypoxemic on BiPAP and suggested she would likely need oxygen at night.  Recommended she follow-up with her sleep specialist.  Advised 72-month follow-up with cardiology.  Regarding upcoming surgery, Dr. Debara Pickett commented in telephone encounter dated 01/31/2021, "Ok to hold warfarin 5 days prior to procedure - restart day after. Will follow-up in the office in July."  Recent admission May 2022 for bladder spasms and concern for recurrent UTI.  She was treated with antibiotics based on cultures.  Spasms improved somewhat on IV Ativan, but there was concern for malfunctioning baclofen pump.  Neurosurgery was consulted and recommended outpatient follow-up to evaluate baclofen pump.  It was noted during his admission that due to progressive MS she is essentially bedbound.  Respiratory therapy notes, she was placed on 5 L supplemental oxygen nightly.  History of cervical fusion C3-C7.  Labs from 01/12/2021 reviewed, unremarkable.  EKG 01/05/2021: Atrial fibrillation.  Rate 77.  CHEST  1 VIEW 01/03/2021: COMPARISON:  11/04/2018   FINDINGS: Hardware in the cervical spine. No focal opacity or pleural effusion. Stable cardiomediastinal silhouette. No pneumothorax.   IMPRESSION: No active disease.  TTE 06/10/2020:  1. Left ventricular ejection fraction, by estimation, is 55 to 60%. The  left ventricle has normal function. The left ventricle has no regional  wall motion abnormalities. The left ventricular internal cavity size was  mildly dilated. Left ventricular  diastolic function could not be evaluated.   2. Right ventricular systolic function is normal. The right ventricular  size is  normal. There is normal pulmonary artery systolic pressure. The  estimated right ventricular systolic pressure is 15.8 mmHg.   3. The mitral valve is normal in structure. Mild mitral valve  regurgitation. No evidence of mitral stenosis.   4. The aortic valve is normal in structure. Aortic valve regurgitation is  not visualized. No aortic stenosis is present.   5. The inferior vena cava is dilated in size with <50% respiratory  variability, suggesting right atrial pressure of 15 mmHg.    Wynonia Musty [facility] Stay [facility]/Anesthesiology Phone [phone] 02/09/2021 10:16 AM

## 2021-02-10 ENCOUNTER — Other Ambulatory Visit: Payer: Self-pay | Admitting: Gastroenterology

## 2021-02-12 DIAGNOSIS — R791 Abnormal coagulation profile: Secondary | ICD-10-CM | POA: Diagnosis not present

## 2021-02-13 DIAGNOSIS — R791 Abnormal coagulation profile: Secondary | ICD-10-CM | POA: Diagnosis not present

## 2021-02-14 DIAGNOSIS — R400 Somnolence: Secondary | ICD-10-CM | POA: Diagnosis not present

## 2021-02-14 DIAGNOSIS — R059 Cough, unspecified: Secondary | ICD-10-CM | POA: Diagnosis not present

## 2021-02-14 DIAGNOSIS — N390 Urinary tract infection, site not specified: Secondary | ICD-10-CM | POA: Diagnosis not present

## 2021-02-14 DIAGNOSIS — I509 Heart failure, unspecified: Secondary | ICD-10-CM | POA: Diagnosis not present

## 2021-02-14 DIAGNOSIS — G4733 Obstructive sleep apnea (adult) (pediatric): Secondary | ICD-10-CM | POA: Diagnosis not present

## 2021-02-14 DIAGNOSIS — E039 Hypothyroidism, unspecified: Secondary | ICD-10-CM | POA: Diagnosis not present

## 2021-02-14 DIAGNOSIS — K5900 Constipation, unspecified: Secondary | ICD-10-CM | POA: Diagnosis not present

## 2021-02-14 DIAGNOSIS — F418 Other specified anxiety disorders: Secondary | ICD-10-CM | POA: Diagnosis not present

## 2021-02-14 DIAGNOSIS — I4891 Unspecified atrial fibrillation: Secondary | ICD-10-CM | POA: Diagnosis not present

## 2021-02-14 DIAGNOSIS — K219 Gastro-esophageal reflux disease without esophagitis: Secondary | ICD-10-CM | POA: Diagnosis not present

## 2021-02-14 DIAGNOSIS — R300 Dysuria: Secondary | ICD-10-CM | POA: Diagnosis not present

## 2021-02-14 NOTE — H&P (Signed)
Patient ID:   [id number] Patient: Toni [name]  Date of Birth: [birth year] Visit Type: Office Visit   Date: 01/28/2021 11:00 AM Provider: Marchia Meiers. Vertell Limber MD   This 75 year old female presents for check baclofen pump.  HISTORY OF PRESENT ILLNESS:  1.  check baclofen pump  Toni [name] returns for evaluation after reporting decreased baclofen pump function.  With the last 3 pump refills, more medication than expected has been found undelivered.  Meanwhile, her symptoms have increased with spasms right greater than left leg up to the diaphragm.  As a result, increasing medication doses have been programmed to maintain her baseline.  Additionally, she has been taking baclofen 20 mg q.i.d. P.o. To maintain baseline. Symptoms required an ER visit in May and a 10 day hospital stay.  ER staff consulted with [facility] due to her severe spasticity and Ativan IV was required.  She has been in rehab since her hospital discharge.  Her baclofen pump was placed in 2009 for treatment of her multiple sclerosis.  Last pump change was in 2015.  Over the last 2 years, she has required oxygen at 5 liters/minute through her BiPAP at night.  AFib has worsened requiring warfarin.  COVID was diagnosed December 2019 and January 2020. She has experienced lower than normal blood pressures.  She was admitted to the hospital in October of 2021 with sepsis secondary to UTI.  UTIs have been constant for the past year.  Cipro ended yesterday.  It does not appear that her pump has flipped.  There is no other indication of catheter compromise.         Medical/Surgical/Interim History Reviewed, no change.  Last detailed document date:09/09/2014.     Family History:  Reviewed, no changes.  Last detailed document date:09/09/2014.   Social History: Reviewed, no changes. Last detailed document date: 09/09/2014.    MEDICATIONS: (added, continued or stopped this visit) Started Medication Directions  Instruction Stopped   baclofen 20 mg tablet take 1 tablet by oral route  1 - 2 times every day as needed     bupropion HCl XL 300 mg 24 hr tablet, extended release take 1 tablet by oral route  every day     Bystolic 20 mg tablet take 1 tablet by oral route  every day     cephalexin 250 mg tablet take 1 tablet by oral route  every day     Flonase 50 mcg/actuation nasal spray,suspension inhale 1 spray by intranasal route  every day in each nostril as needed     furosemide 20 mg tablet take 1 tablet by oral route  every day     gabapentin 300 mg capsule take 1 capsule by oral route 2- 3 times every day as needed     lactulose 10 gram/15 mL oral solution take 1 - 2 tablespoons by oral route  every day     loratadine 10 mg tablet take 1 tablet by oral route  every day     Norco 5 mg-325 mg tablet take 1 - 2 tablets by oral route  every 12 hours as needed for pain     omeprazole 20 mg tablet,delayed release take 1 - 2 capsules by oral route  every day     ranitidine 150 mg tablet take 1 tablet by oral route  every bedtime     simvastatin 20 mg tablet take 1 tablet by oral route  every day in the evening     Synthroid 75 mcg  tablet take 1 tablet by oral route  every day     tamsulosin ER 0.4 mg capsule,extended release 24 hr take 1 capsule by oral route  every day     warfarin 5 mg tablet take 1 tablet by oral route  every day     Xanax 0.5 mg tablet take 1 tablet by oral route 2 times every day       ALLERGIES: Ingredient Reaction Medication Name Comment  SERTRALINE HCL  Zoloft   OXYCODONE     CHLORHEXIDINE     HYDROCODONE      Reviewed, updated. Allergy Comments:  Allergy to Topical Surgical Prep    PHYSICAL EXAM:   Vitals Date Temp F BP Pulse Ht In Wt Lb BMI BSA Pain Score  01/28/2021  123/73 71 63 199 35.25  6/10      IMPRESSION:   Lizanne Erker is in good spirits, accompanied by her daughter.  She is frustrated with her increased symptoms of late and is anxious to have pump  replaced.  Patient is on an anti-coagulant, anti-inflammatory or supplement that may increase bleeding time. Patient advised to stop medicine prior to surgery.  Comments:  Patient will stop Warfarin 5 days preop  PLAN:  Keflex 250 mg p.o. B.i.d. Will be initiated through her [place] to prevent UTI recurrence prior to surgery.  She will stop her warfarin 5 days preop, taking last dose June 17th.  Baclofen pump change with possible catheter revision scheduled for June 23rd at [facility].  [place] OR pharmacy will order baclofen 2000 microgram/milliliter in 40 mL to be available for pump fill in surgery.  She will expect overnight stay at [facility] before returning to rehab at [facility].  She will resume her warfarin June 26. This information is transcribed for [facility].  Patient and her daughter verbalized understanding and wished to proceed.   Assessment/Plan   # Detail Type Description   1. Assessment Multiple sclerosis (G35).       2. Assessment Presence of intrathecal baclofen pump (Z97.8).       3. 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                     Provider:  Marchia Meiers. Vertell Limber MD  01/28/2021 12:30 PM    Dictation edited by: Mike Craze. Poteat RN    CC Providers: Berneta Sages [facility], [place] [address] [place],  [place]  [id number]-   Carmen Dohmeier  [facility] [address] [place], [place] [address]-               Electronically signed by Marchia Meiers Vertell Limber MD on 01/28/2021 12:39 PM

## 2021-02-16 DIAGNOSIS — G4733 Obstructive sleep apnea (adult) (pediatric): Secondary | ICD-10-CM | POA: Diagnosis not present

## 2021-02-16 DIAGNOSIS — R400 Somnolence: Secondary | ICD-10-CM | POA: Diagnosis not present

## 2021-02-16 DIAGNOSIS — N3289 Other specified disorders of bladder: Secondary | ICD-10-CM | POA: Diagnosis not present

## 2021-02-16 DIAGNOSIS — E559 Vitamin D deficiency, unspecified: Secondary | ICD-10-CM | POA: Diagnosis not present

## 2021-02-16 DIAGNOSIS — E785 Hyperlipidemia, unspecified: Secondary | ICD-10-CM | POA: Diagnosis not present

## 2021-02-16 DIAGNOSIS — I4891 Unspecified atrial fibrillation: Secondary | ICD-10-CM | POA: Diagnosis not present

## 2021-02-16 DIAGNOSIS — G8929 Other chronic pain: Secondary | ICD-10-CM | POA: Diagnosis not present

## 2021-02-16 DIAGNOSIS — G35 Multiple sclerosis: Secondary | ICD-10-CM | POA: Diagnosis not present

## 2021-02-16 DIAGNOSIS — N390 Urinary tract infection, site not specified: Secondary | ICD-10-CM | POA: Diagnosis not present

## 2021-02-16 DIAGNOSIS — G4700 Insomnia, unspecified: Secondary | ICD-10-CM | POA: Diagnosis not present

## 2021-02-16 DIAGNOSIS — G629 Polyneuropathy, unspecified: Secondary | ICD-10-CM | POA: Diagnosis not present

## 2021-02-16 DIAGNOSIS — B379 Candidiasis, unspecified: Secondary | ICD-10-CM | POA: Diagnosis not present

## 2021-02-17 ENCOUNTER — Encounter (HOSPITAL_COMMUNITY): Payer: Self-pay | Admitting: Neurosurgery

## 2021-02-17 ENCOUNTER — Other Ambulatory Visit: Payer: Self-pay

## 2021-02-17 NOTE — Progress Notes (Addendum)
Per pt, coumadin stopped 5 days prior. Per pt, she denies being diabetic since she's lost weight. Upon review, last A1C 4.9. hx removed from pt chart - ok per anesthesia.   Pt also given instructions that were [facility]. Caretaker Lenna Sciara was on speaker and verbalized understanding as well.  -------------  SDW INSTRUCTIONS:  Your procedure is scheduled on Friday 02/18/21. Please report to [facility] Main Entrance "A" at 12:20 P.M., and check in at the Admitting office. Call this number if you have problems the morning of surgery: [phone]   Remember: Do not eat after midnight the night before your surgery  You may drink clear liquids until 12:20 PM the morning of your surgery.   Clear liquids allowed are: Water, Non-Citrus Juices (without pulp), Carbonated Beverages, Clear Tea, Black Coffee Only, and Gatorade   Medications to take morning of surgery with a sip of water include: buPROPion (WELLBUTRIN XL)  cephALEXin (KEFLEX) diltiazem (DILACOR XR) gabapentin (NEURONTIN) levothyroxine (SYNTHROID, LEVOTHROID)  omeprazole (PRILOSEC)   If needed: acetaminophen (TYLENOL) ALPRAZolam (XANAX)    As of today, STOP taking any Aspirin (unless otherwise instructed by your surgeon), Aleve, Naproxen, Ibuprofen, Motrin, Advil, Goody's, BC's, all herbal medications, fish oil, and all vitamins.    The Morning of Surgery Do not wear jewelry, make-up or nail polish. Do not wear lotions, powders, or perfumes, or deodorant Do not shave 48 hours prior to surgery.   Do not bring valuables to the hospital. [facility] is not responsible for any belongings or valuables.  If you are a smoker, DO NOT Smoke 24 hours prior to surgery If you wear a CPAP at night please bring your mask the morning of surgery  Remember that you must have someone to transport you home after your surgery, and remain with you for 24 hours if you are discharged the same day.  Please bring cases for  contacts, glasses, hearing aids, dentures or bridgework because it cannot be worn into surgery.   Patients discharged the day of surgery will not be allowed to drive home.   Please shower the NIGHT BEFORE/MORNING OF SURGERY (use antibacterial soap like DIAL soap if possible). Wear comfortable clothes the morning of surgery. Oral Hygiene is also important to reduce your risk of infection.  Remember - BRUSH YOUR TEETH THE MORNING OF SURGERY WITH YOUR REGULAR TOOTHPASTE  Patient denies shortness of breath, fever, cough and chest pain.

## 2021-02-17 NOTE — Progress Notes (Signed)
-------------    SDW INSTRUCTIONS:  Your procedure is scheduled on Friday 02/18/21. Please report to [facility] Main Entrance "A" at 12:20 P.M., and check in at the Admitting office. Call this number if you have problems the morning of surgery: [phone]   Remember: Do not eat after midnight the night before your surgery  You may drink clear liquids until 09:20 AM the morning of your surgery.   Clear liquids allowed are: Water, Non-Citrus Juices (without pulp), Carbonated Beverages, Clear Tea, Black Coffee Only, and Gatorade   Medications to take morning of surgery with a sip of water include: buPROPion (WELLBUTRIN XL)  cephALEXin (KEFLEX) diltiazem (DILACOR XR) gabapentin (NEURONTIN) levothyroxine (SYNTHROID, LEVOTHROID)  omeprazole (PRILOSEC)   If needed: acetaminophen (TYLENOL) ALPRAZolam (XANAX)    As of today, STOP taking any Aspirin (unless otherwise instructed by your surgeon), Aleve, Naproxen, Ibuprofen, Motrin, Advil, Goody's, BC's, all herbal medications, fish oil, and all vitamins.    The Morning of Surgery Do not wear jewelry, make-up or nail polish. Do not wear lotions, powders, or perfumes, or deodorant Do not shave 48 hours prior to surgery.   Do not bring valuables to the hospital. [facility] is not responsible for any belongings or valuables.  If you are a smoker, DO NOT Smoke 24 hours prior to surgery If you wear a CPAP at night please bring your mask the morning of surgery  Remember that you must have someone to transport you home after your surgery, and remain with you for 24 hours if you are discharged the same day.  Please bring cases for contacts, glasses, hearing aids, dentures or bridgework because it cannot be worn into surgery.   Patients discharged the day of surgery will not be allowed to drive home.   Please shower the NIGHT BEFORE/MORNING OF SURGERY (use antibacterial soap like DIAL soap if possible). Wear comfortable clothes the morning of  surgery. Oral Hygiene is also important to reduce your risk of infection.  Remember - BRUSH YOUR TEETH THE MORNING OF SURGERY WITH YOUR REGULAR TOOTHPASTE  Patient denies shortness of breath, fever, cough and chest pain.

## 2021-02-18 ENCOUNTER — Ambulatory Visit (HOSPITAL_COMMUNITY): Payer: Medicare Other | Admitting: Physician Assistant

## 2021-02-18 ENCOUNTER — Encounter (HOSPITAL_COMMUNITY)
Admission: RE | Disposition: A | Discharge status: Skilled Nursing Facility | Payer: Self-pay | Source: Skilled Nursing Facility | Attending: Neurosurgery

## 2021-02-18 ENCOUNTER — Inpatient Hospital Stay (HOSPITAL_COMMUNITY)
Admission: RE | Admit: 2021-02-18 | Discharge: 2021-02-20 | DRG: 041 | Disposition: A | Discharge status: Skilled Nursing Facility | Payer: Medicare Other | Source: Skilled Nursing Facility | Attending: Neurosurgery | Admitting: Neurosurgery

## 2021-02-18 ENCOUNTER — Encounter (HOSPITAL_COMMUNITY): Payer: Self-pay | Admitting: Neurosurgery

## 2021-02-18 DIAGNOSIS — R252 Cramp and spasm: Secondary | ICD-10-CM | POA: Diagnosis present

## 2021-02-18 DIAGNOSIS — I110 Hypertensive heart disease with heart failure: Secondary | ICD-10-CM | POA: Diagnosis not present

## 2021-02-18 DIAGNOSIS — E119 Type 2 diabetes mellitus without complications: Secondary | ICD-10-CM | POA: Diagnosis not present

## 2021-02-18 DIAGNOSIS — R5383 Other fatigue: Secondary | ICD-10-CM | POA: Diagnosis not present

## 2021-02-18 DIAGNOSIS — Z87891 Personal history of nicotine dependence: Secondary | ICD-10-CM | POA: Diagnosis not present

## 2021-02-18 DIAGNOSIS — Z7401 Bed confinement status: Secondary | ICD-10-CM

## 2021-02-18 DIAGNOSIS — Z79899 Other long term (current) drug therapy: Secondary | ICD-10-CM | POA: Diagnosis not present

## 2021-02-18 DIAGNOSIS — R197 Diarrhea, unspecified: Secondary | ICD-10-CM | POA: Diagnosis not present

## 2021-02-18 DIAGNOSIS — K5909 Other constipation: Secondary | ICD-10-CM | POA: Diagnosis present

## 2021-02-18 DIAGNOSIS — T85610A Breakdown (mechanical) of epidural and subdural infusion catheter, initial encounter: Secondary | ICD-10-CM | POA: Diagnosis present

## 2021-02-18 DIAGNOSIS — Y838 Other surgical procedures as the cause of abnormal reaction of the patient, or of later complication, without mention of misadventure at the time of the procedure: Secondary | ICD-10-CM | POA: Diagnosis present

## 2021-02-18 DIAGNOSIS — Z888 Allergy status to other drugs, medicaments and biological substances status: Secondary | ICD-10-CM

## 2021-02-18 DIAGNOSIS — I4891 Unspecified atrial fibrillation: Secondary | ICD-10-CM | POA: Diagnosis not present

## 2021-02-18 DIAGNOSIS — I480 Paroxysmal atrial fibrillation: Secondary | ICD-10-CM | POA: Diagnosis not present

## 2021-02-18 DIAGNOSIS — Z7901 Long term (current) use of anticoagulants: Secondary | ICD-10-CM | POA: Diagnosis not present

## 2021-02-18 DIAGNOSIS — E240 Pituitary-dependent Cushing's disease: Secondary | ICD-10-CM | POA: Diagnosis not present

## 2021-02-18 DIAGNOSIS — N319 Neuromuscular dysfunction of bladder, unspecified: Secondary | ICD-10-CM | POA: Diagnosis present

## 2021-02-18 DIAGNOSIS — Z981 Arthrodesis status: Secondary | ICD-10-CM | POA: Diagnosis not present

## 2021-02-18 DIAGNOSIS — G5603 Carpal tunnel syndrome, bilateral upper limbs: Secondary | ICD-10-CM | POA: Diagnosis not present

## 2021-02-18 DIAGNOSIS — J9611 Chronic respiratory failure with hypoxia: Secondary | ICD-10-CM | POA: Diagnosis not present

## 2021-02-18 DIAGNOSIS — I5032 Chronic diastolic (congestive) heart failure: Secondary | ICD-10-CM | POA: Diagnosis not present

## 2021-02-18 DIAGNOSIS — Z8616 Personal history of COVID-19: Secondary | ICD-10-CM

## 2021-02-18 DIAGNOSIS — I9589 Other hypotension: Secondary | ICD-10-CM | POA: Diagnosis present

## 2021-02-18 DIAGNOSIS — G8929 Other chronic pain: Secondary | ICD-10-CM | POA: Diagnosis not present

## 2021-02-18 DIAGNOSIS — I4821 Permanent atrial fibrillation: Secondary | ICD-10-CM | POA: Diagnosis present

## 2021-02-18 DIAGNOSIS — F419 Anxiety disorder, unspecified: Secondary | ICD-10-CM | POA: Diagnosis not present

## 2021-02-18 DIAGNOSIS — T85695A Other mechanical complication of other nervous system device, implant or graft, initial encounter: Secondary | ICD-10-CM | POA: Diagnosis not present

## 2021-02-18 DIAGNOSIS — Z20822 Contact with and (suspected) exposure to covid-19: Secondary | ICD-10-CM | POA: Diagnosis present

## 2021-02-18 DIAGNOSIS — Z885 Allergy status to narcotic agent status: Secondary | ICD-10-CM | POA: Diagnosis not present

## 2021-02-18 DIAGNOSIS — Z9989 Dependence on other enabling machines and devices: Secondary | ICD-10-CM | POA: Diagnosis not present

## 2021-02-18 DIAGNOSIS — F32A Depression, unspecified: Secondary | ICD-10-CM | POA: Diagnosis not present

## 2021-02-18 DIAGNOSIS — Z978 Presence of other specified devices: Secondary | ICD-10-CM | POA: Diagnosis not present

## 2021-02-18 DIAGNOSIS — G35 Multiple sclerosis: Secondary | ICD-10-CM | POA: Diagnosis present

## 2021-02-18 DIAGNOSIS — Z743 Need for continuous supervision: Secondary | ICD-10-CM | POA: Diagnosis not present

## 2021-02-18 DIAGNOSIS — E559 Vitamin D deficiency, unspecified: Secondary | ICD-10-CM | POA: Diagnosis not present

## 2021-02-18 DIAGNOSIS — E039 Hypothyroidism, unspecified: Secondary | ICD-10-CM | POA: Diagnosis not present

## 2021-02-18 DIAGNOSIS — G4733 Obstructive sleep apnea (adult) (pediatric): Secondary | ICD-10-CM | POA: Diagnosis present

## 2021-02-18 DIAGNOSIS — G8918 Other acute postprocedural pain: Secondary | ICD-10-CM | POA: Diagnosis not present

## 2021-02-18 DIAGNOSIS — N390 Urinary tract infection, site not specified: Secondary | ICD-10-CM | POA: Diagnosis not present

## 2021-02-18 DIAGNOSIS — L89152 Pressure ulcer of sacral region, stage 2: Secondary | ICD-10-CM | POA: Diagnosis not present

## 2021-02-18 DIAGNOSIS — G4700 Insomnia, unspecified: Secondary | ICD-10-CM | POA: Diagnosis not present

## 2021-02-18 DIAGNOSIS — M6281 Muscle weakness (generalized): Secondary | ICD-10-CM | POA: Diagnosis not present

## 2021-02-18 DIAGNOSIS — Z79890 Hormone replacement therapy (postmenopausal): Secondary | ICD-10-CM

## 2021-02-18 DIAGNOSIS — T859XXA Unspecified complication of internal prosthetic device, implant and graft, initial encounter: Secondary | ICD-10-CM | POA: Diagnosis not present

## 2021-02-18 DIAGNOSIS — T85615A Breakdown (mechanical) of other nervous system device, implant or graft, initial encounter: Secondary | ICD-10-CM | POA: Diagnosis not present

## 2021-02-18 DIAGNOSIS — R1310 Dysphagia, unspecified: Secondary | ICD-10-CM | POA: Diagnosis not present

## 2021-02-18 DIAGNOSIS — G4731 Primary central sleep apnea: Secondary | ICD-10-CM | POA: Diagnosis not present

## 2021-02-18 DIAGNOSIS — R0600 Dyspnea, unspecified: Secondary | ICD-10-CM | POA: Diagnosis not present

## 2021-02-18 DIAGNOSIS — E785 Hyperlipidemia, unspecified: Secondary | ICD-10-CM | POA: Diagnosis not present

## 2021-02-18 DIAGNOSIS — R0902 Hypoxemia: Secondary | ICD-10-CM | POA: Diagnosis not present

## 2021-02-18 DIAGNOSIS — I469 Cardiac arrest, cause unspecified: Secondary | ICD-10-CM | POA: Diagnosis not present

## 2021-02-18 HISTORY — PX: INTRATHECAL PUMP REVISION: SHX6810

## 2021-02-18 LAB — CBC
HCT: 42.7 % (ref 36.0–46.0)
Hemoglobin: 14.3 g/dL (ref 12.0–15.0)
MCH: 31.4 pg (ref 26.0–34.0)
MCHC: 33.5 g/dL (ref 30.0–36.0)
MCV: 93.8 fL (ref 80.0–100.0)
Platelets: 176 10*3/uL (ref 150–400)
RBC: 4.55 MIL/uL (ref 3.87–5.11)
RDW: 12.3 % (ref 11.5–15.5)
WBC: 5.9 10*3/uL (ref 4.0–10.5)
nRBC: 0 % (ref 0.0–0.2)

## 2021-02-18 LAB — BASIC METABOLIC PANEL WITH GFR
Anion gap: 8 (ref 5–15)
BUN: 19 mg/dL (ref 8–23)
CO2: 33 mmol/L — ABNORMAL HIGH (ref 22–32)
Calcium: 9.1 mg/dL (ref 8.9–10.3)
Chloride: 95 mmol/L — ABNORMAL LOW (ref 98–111)
Creatinine, Ser: 0.54 mg/dL (ref 0.44–1.00)
GFR, Estimated: >60 mL/min
Glucose, Bld: 87 mg/dL (ref 70–99)
Potassium: 3.9 mmol/L (ref 3.5–5.1)
Sodium: 136 mmol/L (ref 135–145)

## 2021-02-18 LAB — PROTIME-INR
INR: 1.2 (ref 0.8–1.2)
Prothrombin Time: 15.2 s (ref 11.4–15.2)

## 2021-02-18 LAB — APTT: aPTT: 32 s (ref 24–36)

## 2021-02-18 LAB — SARS CORONAVIRUS 2 BY RT PCR (HOSPITAL ORDER, PERFORMED IN ~~LOC~~ HOSPITAL LAB): SARS Coronavirus 2: NEGATIVE

## 2021-02-18 SURGERY — INTRATHECAL PUMP REVISION
Anesthesia: General

## 2021-02-18 MED ORDER — LINACLOTIDE 145 MCG PO CAPS
145.0000 ug | ORAL_CAPSULE | Freq: Every day | ORAL | Status: DC
  Administered 2021-02-19 – 2021-02-20 (×2): 145 ug via ORAL
  Filled 2021-02-18 (×3): qty 1

## 2021-02-18 MED ORDER — PHENOL 1.4 % MT LIQD: 1.0000 | OROMUCOSAL | Status: DC | PRN

## 2021-02-18 MED ORDER — ACETAMINOPHEN 650 MG RE SUPP: 650.0000 mg | RECTAL | Status: DC | PRN

## 2021-02-18 MED ORDER — ACETAMINOPHEN 500 MG PO TABS: 1000.0000 mg | ORAL_TABLET | Freq: Four times a day (QID) | ORAL | Status: DC | PRN

## 2021-02-18 MED ORDER — MENTHOL-ZINC OXIDE 0.4-20.5 % EX PSTE: 1.0000 "application " | PASTE | Freq: Three times a day (TID) | CUTANEOUS | Status: DC | PRN

## 2021-02-18 MED ORDER — CALCIUM CARBONATE-VITAMIN D3 600-400 MG-UNIT PO TABS: 1.0000 | ORAL_TABLET | Freq: Two times a day (BID) | ORAL | Status: DC

## 2021-02-18 MED ORDER — ONDANSETRON HCL 4 MG PO TABS
4.0000 mg | ORAL_TABLET | Freq: Four times a day (QID) | ORAL | Status: DC | PRN
  Administered 2021-02-19 (×2): 4 mg via ORAL
  Filled 2021-02-18 (×2): qty 1

## 2021-02-18 MED ORDER — ACETAMINOPHEN 325 MG PO TABS
650.0000 mg | ORAL_TABLET | ORAL | Status: DC | PRN
  Administered 2021-02-19: 325 mg via ORAL
  Administered 2021-02-19: 650 mg via ORAL
  Administered 2021-02-19: 325 mg via ORAL
  Filled 2021-02-18 (×3): qty 2

## 2021-02-18 MED ORDER — PROPOFOL 10 MG/ML IV BOLUS
INTRAVENOUS | Status: AC
  Filled 2021-02-18: qty 20

## 2021-02-18 MED ORDER — ALUM & MAG HYDROXIDE-SIMETH 200-200-20 MG/5ML PO SUSP: 30.0000 mL | Freq: Four times a day (QID) | ORAL | Status: DC | PRN

## 2021-02-18 MED ORDER — ACETAMINOPHEN 325 MG PO TABS: 650.0000 mg | ORAL_TABLET | ORAL | Status: DC | PRN

## 2021-02-18 MED ORDER — DOCUSATE SODIUM 100 MG PO CAPS
100.0000 mg | ORAL_CAPSULE | Freq: Every day | ORAL | Status: DC
  Administered 2021-02-18 – 2021-02-19 (×2): 100 mg via ORAL
  Filled 2021-02-18 (×2): qty 1

## 2021-02-18 MED ORDER — ADULT MULTIVITAMIN W/MINERALS CH
1.0000 | ORAL_TABLET | Freq: Every day | ORAL | Status: DC
  Administered 2021-02-19 – 2021-02-20 (×2): 1 via ORAL
  Filled 2021-02-18 (×2): qty 1

## 2021-02-18 MED ORDER — PHENYLEPHRINE 40 MCG/ML (10ML) SYRINGE FOR IV PUSH (FOR BLOOD PRESSURE SUPPORT)
PREFILLED_SYRINGE | INTRAVENOUS | Status: DC | PRN
  Administered 2021-02-18: 160 ug via INTRAVENOUS

## 2021-02-18 MED ORDER — ROCURONIUM BROMIDE 10 MG/ML (PF) SYRINGE
PREFILLED_SYRINGE | INTRAVENOUS | Status: DC | PRN
  Administered 2021-02-18: 50 mg via INTRAVENOUS

## 2021-02-18 MED ORDER — ZINC OXIDE 20 % EX OINT
TOPICAL_OINTMENT | Freq: Three times a day (TID) | CUTANEOUS | Status: DC | PRN
  Filled 2021-02-18: qty 28.35

## 2021-02-18 MED ORDER — LACTATED RINGERS IV SOLN: INTRAVENOUS | Status: DC

## 2021-02-18 MED ORDER — LEVOTHYROXINE SODIUM 75 MCG PO TABS
75.0000 ug | ORAL_TABLET | Freq: Every day | ORAL | Status: DC
  Administered 2021-02-19 – 2021-02-20 (×2): 75 ug via ORAL
  Filled 2021-02-18 (×2): qty 1

## 2021-02-18 MED ORDER — MENTHOL 3 MG MT LOZG: 1.0000 | LOZENGE | OROMUCOSAL | Status: DC | PRN

## 2021-02-18 MED ORDER — FAMOTIDINE 20 MG PO TABS
20.0000 mg | ORAL_TABLET | Freq: Every day | ORAL | Status: DC
  Administered 2021-02-18 – 2021-02-19 (×2): 20 mg via ORAL
  Filled 2021-02-18 (×2): qty 1

## 2021-02-18 MED ORDER — VITAMIN D 25 MCG (1000 UNIT) PO TABS
2000.0000 [IU] | ORAL_TABLET | Freq: Two times a day (BID) | ORAL | Status: DC
  Administered 2021-02-19 – 2021-02-20 (×4): 2000 [IU] via ORAL
  Filled 2021-02-18 (×4): qty 2

## 2021-02-18 MED ORDER — SODIUM CHLORIDE 0.9% FLUSH
3.0000 mL | Freq: Two times a day (BID) | INTRAVENOUS | Status: DC
  Administered 2021-02-18 – 2021-02-19 (×2): 3 mL via INTRAVENOUS

## 2021-02-18 MED ORDER — ONDANSETRON HCL 4 MG PO TABS: 4.0000 mg | ORAL_TABLET | Freq: Four times a day (QID) | ORAL | Status: DC | PRN

## 2021-02-18 MED ORDER — LIDOCAINE-EPINEPHRINE 1 %-1:100000 IJ SOLN
INTRAMUSCULAR | Status: DC | PRN
  Administered 2021-02-18: 10 mL

## 2021-02-18 MED ORDER — LIDOCAINE 2% (20 MG/ML) 5 ML SYRINGE
INTRAMUSCULAR | Status: DC | PRN
  Administered 2021-02-18: 100 mg via INTRAVENOUS

## 2021-02-18 MED ORDER — SODIUM CHLORIDE 0.9 % IV SOLN: 250.0000 mL | INTRAVENOUS | Status: DC

## 2021-02-18 MED ORDER — MELATONIN 5 MG PO TABS
5.0000 mg | ORAL_TABLET | Freq: Every day | ORAL | Status: DC
  Administered 2021-02-18 – 2021-02-19 (×2): 5 mg via ORAL
  Filled 2021-02-18 (×2): qty 1

## 2021-02-18 MED ORDER — DEXAMETHASONE SODIUM PHOSPHATE 10 MG/ML IJ SOLN
INTRAMUSCULAR | Status: DC | PRN
  Administered 2021-02-18: 10 mg via INTRAVENOUS

## 2021-02-18 MED ORDER — BUPIVACAINE HCL (PF) 0.5 % IJ SOLN
INTRAMUSCULAR | Status: DC | PRN
  Administered 2021-02-18: 10 mL

## 2021-02-18 MED ORDER — ZINC OXIDE 20 % EX OINT: 1.0000 "application " | TOPICAL_OINTMENT | CUTANEOUS | Status: DC | PRN

## 2021-02-18 MED ORDER — BUPIVACAINE HCL (PF) 0.5 % IJ SOLN
INTRAMUSCULAR | Status: AC
  Filled 2021-02-18: qty 30

## 2021-02-18 MED ORDER — ALPRAZOLAM 0.5 MG PO TABS: 0.5000 mg | ORAL_TABLET | Freq: Three times a day (TID) | ORAL | Status: DC | PRN

## 2021-02-18 MED ORDER — SUFENTANIL CITRATE 50 MCG/ML IV SOLN
INTRAVENOUS | Status: DC | PRN
  Administered 2021-02-18: 10 ug via INTRAVENOUS

## 2021-02-18 MED ORDER — SUGAMMADEX SODIUM 200 MG/2ML IV SOLN
INTRAVENOUS | Status: DC | PRN
  Administered 2021-02-18: 200 mg via INTRAVENOUS

## 2021-02-18 MED ORDER — GABAPENTIN 300 MG PO CAPS
300.0000 mg | ORAL_CAPSULE | Freq: Four times a day (QID) | ORAL | Status: DC
  Administered 2021-02-18 – 2021-02-20 (×7): 300 mg via ORAL
  Filled 2021-02-18 (×7): qty 1

## 2021-02-18 MED ORDER — SUFENTANIL CITRATE 50 MCG/ML IV SOLN
INTRAVENOUS | Status: AC
  Filled 2021-02-18: qty 1

## 2021-02-18 MED ORDER — OXYBUTYNIN CHLORIDE 5 MG PO TABS
5.0000 mg | ORAL_TABLET | Freq: Three times a day (TID) | ORAL | Status: DC | PRN
  Filled 2021-02-18: qty 1

## 2021-02-18 MED ORDER — ESCITALOPRAM OXALATE 10 MG PO TABS
10.0000 mg | ORAL_TABLET | Freq: Every day | ORAL | Status: DC
  Administered 2021-02-18 – 2021-02-19 (×2): 10 mg via ORAL
  Filled 2021-02-18 (×2): qty 1

## 2021-02-18 MED ORDER — PANTOPRAZOLE SODIUM 40 MG PO TBEC
40.0000 mg | DELAYED_RELEASE_TABLET | Freq: Every day | ORAL | Status: DC
  Administered 2021-02-19 – 2021-02-20 (×2): 40 mg via ORAL
  Filled 2021-02-18 (×2): qty 1

## 2021-02-18 MED ORDER — CEPHALEXIN 250 MG PO CAPS: 250.0000 mg | ORAL_CAPSULE | Freq: Two times a day (BID) | ORAL | Status: DC

## 2021-02-18 MED ORDER — SODIUM CHLORIDE 0.9% FLUSH: 3.0000 mL | INTRAVENOUS | Status: DC | PRN

## 2021-02-18 MED ORDER — MIDODRINE HCL 5 MG PO TABS: 5.0000 mg | ORAL_TABLET | Freq: Every day | ORAL | Status: DC | PRN

## 2021-02-18 MED ORDER — ONDANSETRON HCL 4 MG PO TABS: 8.0000 mg | ORAL_TABLET | Freq: Every day | ORAL | Status: DC | PRN

## 2021-02-18 MED ORDER — OMEGA-3-ACID ETHYL ESTERS 1 G PO CAPS
1.0000 g | ORAL_CAPSULE | Freq: Every day | ORAL | Status: DC
  Administered 2021-02-18 – 2021-02-20 (×3): 1 g via ORAL
  Filled 2021-02-18 (×3): qty 1

## 2021-02-18 MED ORDER — LACTATED RINGERS IV SOLN: INTRAVENOUS | Status: DC | PRN

## 2021-02-18 MED ORDER — CALCIUM CARBONATE-VITAMIN D 500-200 MG-UNIT PO TABS
1.0000 | ORAL_TABLET | Freq: Two times a day (BID) | ORAL | Status: DC
  Administered 2021-02-19 – 2021-02-20 (×4): 1 via ORAL
  Filled 2021-02-18 (×4): qty 1

## 2021-02-18 MED ORDER — VANCOMYCIN HCL 1000 MG/200ML IV SOLN
1000.0000 mg | Freq: Once | INTRAVENOUS | Status: AC
  Administered 2021-02-18: 1000 mg via INTRAVENOUS
  Filled 2021-02-18: qty 200

## 2021-02-18 MED ORDER — PROPYLENE GLYCOL-GLYCERIN 1-0.3 % OP SOLN: 2.0000 [drp] | Freq: Every day | OPHTHALMIC | Status: DC | PRN

## 2021-02-18 MED ORDER — BUPROPION HCL ER (XL) 150 MG PO TB24
150.0000 mg | ORAL_TABLET | Freq: Every day | ORAL | Status: DC
  Administered 2021-02-19 – 2021-02-20 (×2): 150 mg via ORAL
  Filled 2021-02-18 (×2): qty 1

## 2021-02-18 MED ORDER — LORATADINE 10 MG PO TABS
10.0000 mg | ORAL_TABLET | Freq: Every day | ORAL | Status: DC
  Administered 2021-02-20: 10 mg via ORAL
  Filled 2021-02-18 (×2): qty 1

## 2021-02-18 MED ORDER — LACTULOSE 10 GM/15ML PO SOLN
10.0000 g | Freq: Two times a day (BID) | ORAL | Status: DC | PRN
  Administered 2021-02-19 – 2021-02-20 (×2): 10 g via ORAL
  Filled 2021-02-18 (×2): qty 15

## 2021-02-18 MED ORDER — TRAMADOL HCL 50 MG PO TABS
50.0000 mg | ORAL_TABLET | Freq: Four times a day (QID) | ORAL | Status: DC | PRN
  Administered 2021-02-19: 50 mg via ORAL
  Filled 2021-02-18: qty 1

## 2021-02-18 MED ORDER — LIDOCAINE-EPINEPHRINE 1 %-1:100000 IJ SOLN
INTRAMUSCULAR | Status: AC
  Filled 2021-02-18: qty 1

## 2021-02-18 MED ORDER — ONDANSETRON HCL 4 MG/2ML IJ SOLN: 4.0000 mg | Freq: Four times a day (QID) | INTRAMUSCULAR | Status: DC | PRN

## 2021-02-18 MED ORDER — CEFAZOLIN SODIUM-DEXTROSE 2-4 GM/100ML-% IV SOLN
2.0000 g | Freq: Three times a day (TID) | INTRAVENOUS | Status: AC
  Administered 2021-02-19 (×2): 2 g via INTRAVENOUS
  Filled 2021-02-18 (×2): qty 100

## 2021-02-18 MED ORDER — DILTIAZEM HCL ER COATED BEADS 240 MG PO CP24
240.0000 mg | ORAL_CAPSULE | Freq: Every day | ORAL | Status: DC
  Administered 2021-02-19 – 2021-02-20 (×2): 240 mg via ORAL
  Filled 2021-02-18: qty 2
  Filled 2021-02-18 (×3): qty 1
  Filled 2021-02-18: qty 2

## 2021-02-18 MED ORDER — ONDANSETRON HCL 4 MG/2ML IJ SOLN
INTRAMUSCULAR | Status: DC | PRN
  Administered 2021-02-18: 4 mg via INTRAVENOUS

## 2021-02-18 MED ORDER — BACLOFEN 40 MG/20ML IT SOLN
INTRATHECAL | Status: DC | PRN
  Administered 2021-02-18 (×2): 40 mg via INTRATHECAL

## 2021-02-18 MED ORDER — KCL IN DEXTROSE-NACL 20-5-0.45 MEQ/L-%-% IV SOLN
INTRAVENOUS | Status: DC
  Filled 2021-02-18: qty 1000

## 2021-02-18 MED ORDER — POTASSIUM CHLORIDE CRYS ER 20 MEQ PO TBCR
20.0000 meq | EXTENDED_RELEASE_TABLET | Freq: Two times a day (BID) | ORAL | Status: DC
  Administered 2021-02-19 – 2021-02-20 (×4): 20 meq via ORAL
  Filled 2021-02-18 (×4): qty 1

## 2021-02-18 MED ORDER — SIMVASTATIN 20 MG PO TABS
10.0000 mg | ORAL_TABLET | Freq: Every day | ORAL | Status: DC
  Administered 2021-02-18 – 2021-02-19 (×2): 10 mg via ORAL
  Filled 2021-02-18 (×2): qty 1

## 2021-02-18 MED ORDER — PHENYLEPHRINE HCL-NACL 10-0.9 MG/250ML-% IV SOLN
INTRAVENOUS | Status: DC | PRN
  Administered 2021-02-18: 50 ug/min via INTRAVENOUS

## 2021-02-18 MED ORDER — BACID PO TABS
1.0000 | ORAL_TABLET | Freq: Every day | ORAL | Status: DC
  Filled 2021-02-18: qty 1

## 2021-02-18 MED ORDER — CEFAZOLIN SODIUM-DEXTROSE 2-3 GM-%(50ML) IV SOLR
INTRAVENOUS | Status: DC | PRN
  Administered 2021-02-18: 2 g via INTRAVENOUS

## 2021-02-18 MED ORDER — 0.9 % SODIUM CHLORIDE (POUR BTL) OPTIME
TOPICAL | Status: DC | PRN
  Administered 2021-02-18: 1000 mL

## 2021-02-18 MED ORDER — PROPOFOL 10 MG/ML IV BOLUS
INTRAVENOUS | Status: DC | PRN
  Administered 2021-02-18: 160 mg via INTRAVENOUS

## 2021-02-18 MED ORDER — BACITRACIN-NEOMYCIN-POLYMYXIN 400-5-5000 EX OINT
1.0000 "application " | TOPICAL_OINTMENT | Freq: Two times a day (BID) | CUTANEOUS | Status: DC | PRN
  Filled 2021-02-18: qty 1

## 2021-02-18 MED ORDER — PANTOPRAZOLE SODIUM 40 MG IV SOLR: 40.0000 mg | Freq: Every day | INTRAVENOUS | Status: DC

## 2021-02-18 MED ORDER — FUROSEMIDE 40 MG PO TABS
40.0000 mg | ORAL_TABLET | Freq: Two times a day (BID) | ORAL | Status: DC
  Administered 2021-02-19 – 2021-02-20 (×2): 40 mg via ORAL
  Filled 2021-02-18 (×2): qty 1

## 2021-02-18 MED ORDER — PSYLLIUM 95 % PO PACK
1.0000 | PACK | Freq: Every day | ORAL | Status: DC
  Administered 2021-02-18 – 2021-02-19 (×2): 1 via ORAL
  Filled 2021-02-18 (×3): qty 1

## 2021-02-18 MED ORDER — POLYVINYL ALCOHOL 1.4 % OP SOLN
1.0000 [drp] | OPHTHALMIC | Status: DC | PRN
  Filled 2021-02-18: qty 15

## 2021-02-18 MED ORDER — DICLOFENAC SODIUM 1 % EX GEL
2.0000 g | Freq: Three times a day (TID) | CUTANEOUS | Status: DC | PRN
  Filled 2021-02-18: qty 100

## 2021-02-18 MED ORDER — BIOTIN 10000 MCG PO TABS: 10000.0000 ug | ORAL_TABLET | Freq: Every day | ORAL | Status: DC

## 2021-02-18 MED ORDER — LORAZEPAM 2 MG/ML PO CONC
0.5000 mg | Freq: Every day | ORAL | Status: DC | PRN
  Filled 2021-02-18: qty 0.25

## 2021-02-18 SURGICAL SUPPLY — 68 items
ADH SKN CLS LQ APL DERMABOND (GAUZE/BANDAGES/DRESSINGS) ×1
BAG COUNTER SPONGE SURGICOUNT (BAG) ×2 IMPLANT
BAG SPNG CNTER NS LX DISP (BAG) ×1
BAG SURGICOUNT SPONGE COUNTING (BAG) ×1
BLADE CLIPPER SURG (BLADE) IMPLANT
BLADE SURG 10 STRL SS (BLADE) ×6 IMPLANT
BLADE SURG 15 STRL LF DISP TIS (BLADE) ×1 IMPLANT
BLADE SURG 15 STRL SS (BLADE) ×3
BOOT SUTURE AID YELLOW STND (SUTURE) IMPLANT
CABLE BIPOLOR RESECTION CORD (MISCELLANEOUS) ×3 IMPLANT
CANISTER SUCT 3000ML PPV (MISCELLANEOUS) ×3 IMPLANT
CARTRIDGE OIL MAESTRO DRILL (MISCELLANEOUS) IMPLANT
COVER MAYO STAND STRL (DRAPES) IMPLANT
DECANTER SPIKE VIAL GLASS SM (MISCELLANEOUS) IMPLANT
DERMABOND ADHESIVE PROPEN (GAUZE/BANDAGES/DRESSINGS) ×2
DERMABOND ADVANCED .7 DNX6 (GAUZE/BANDAGES/DRESSINGS) ×1 IMPLANT
DIFFUSER DRILL AIR PNEUMATIC (MISCELLANEOUS) IMPLANT
DRAPE C-ARM 42X72 X-RAY (DRAPES) IMPLANT
DRAPE INCISE IOBAN 85X60 (DRAPES) ×6 IMPLANT
DRAPE LAPAROTOMY 100X72X124 (DRAPES) ×3 IMPLANT
DRSG OPSITE POSTOP 4X6 (GAUZE/BANDAGES/DRESSINGS) ×3 IMPLANT
DRSG PAD ABDOMINAL 8X10 ST (GAUZE/BANDAGES/DRESSINGS) IMPLANT
ELECT REM PT RETURN 9FT ADLT (ELECTROSURGICAL) ×3
ELECTRODE REM PT RTRN 9FT ADLT (ELECTROSURGICAL) ×1 IMPLANT
GAUZE 4X4 16PLY ~~LOC~~+RFID DBL (SPONGE) ×3 IMPLANT
GAUZE SPONGE 4X4 12PLY STRL (GAUZE/BANDAGES/DRESSINGS) IMPLANT
GLOVE EXAM NITRILE XL STR (GLOVE) IMPLANT
GLOVE SRG 8 PF TXTR STRL LF DI (GLOVE) ×1 IMPLANT
GLOVE SURG ENC MOIS LTX SZ8 (GLOVE) ×3 IMPLANT
GLOVE SURG LTX SZ8 (GLOVE) ×3 IMPLANT
GLOVE SURG UNDER POLY LF SZ8 (GLOVE) ×3
GLOVE SURG UNDER POLY LF SZ8.5 (GLOVE) ×3 IMPLANT
GOWN STRL REUS W/ TWL LRG LVL3 (GOWN DISPOSABLE) IMPLANT
GOWN STRL REUS W/ TWL XL LVL3 (GOWN DISPOSABLE) IMPLANT
GOWN STRL REUS W/TWL 2XL LVL3 (GOWN DISPOSABLE) IMPLANT
GOWN STRL REUS W/TWL LRG LVL3 (GOWN DISPOSABLE)
GOWN STRL REUS W/TWL XL LVL3 (GOWN DISPOSABLE)
KIT BASIN OR (CUSTOM PROCEDURE TRAY) ×3 IMPLANT
KIT REFILL (MISCELLANEOUS) ×2
KIT REFILL CATH SYNCHROMED II (MISCELLANEOUS) ×1 IMPLANT
KIT TURNOVER KIT B (KITS) ×3 IMPLANT
NEEDLE HYPO 18GX1.5 BLUNT FILL (NEEDLE) IMPLANT
NEEDLE HYPO 25X1 1.5 SAFETY (NEEDLE) ×3 IMPLANT
NS IRRIG 1000ML POUR BTL (IV SOLUTION) ×3 IMPLANT
OIL CARTRIDGE MAESTRO DRILL (MISCELLANEOUS)
PACK EENT II TURBAN DRAPE (CUSTOM PROCEDURE TRAY) ×3 IMPLANT
PATTIES SURGICAL .5 X.5 (GAUZE/BANDAGES/DRESSINGS) IMPLANT
PATTIES SURGICAL 1X1 (DISPOSABLE) IMPLANT
PENCIL BUTTON HOLSTER BLD 10FT (ELECTRODE) ×3 IMPLANT
PUMP SYNCHROMED II 40ML RESVR (Neuro Prosthesis/Implant) ×3 IMPLANT
SPONGE SURGIFOAM ABS GEL SZ50 (HEMOSTASIS) IMPLANT
SPONGE T-LAP 4X18 ~~LOC~~+RFID (SPONGE) ×3 IMPLANT
STAPLER SKIN PROX WIDE 3.9 (STAPLE) ×3 IMPLANT
SUT BONE WAX W31G (SUTURE) IMPLANT
SUT SILK 0 TIES 10X30 (SUTURE) ×3 IMPLANT
SUT SILK 2 0 PERMA HAND 18 BK (SUTURE) ×6 IMPLANT
SUT SILK 2 0 TIES 10X30 (SUTURE) IMPLANT
SUT VIC AB 0 CT1 18XCR BRD8 (SUTURE) ×1 IMPLANT
SUT VIC AB 0 CT1 8-18 (SUTURE) ×3
SUT VIC AB 2-0 CP2 18 (SUTURE) ×3 IMPLANT
SUT VIC AB 3-0 SH 8-18 (SUTURE) ×3 IMPLANT
SYR 3ML LL SCALE MARK (SYRINGE) ×3 IMPLANT
SYR CONTROL 10ML LL (SYRINGE) ×3 IMPLANT
TOWEL GREEN STERILE (TOWEL DISPOSABLE) ×3 IMPLANT
TOWEL GREEN STERILE FF (TOWEL DISPOSABLE) ×3 IMPLANT
TUBE CONNECTING 12'X1/4 (SUCTIONS) ×2
TUBE CONNECTING 12X1/4 (SUCTIONS) ×4 IMPLANT
WATER STERILE IRR 1000ML POUR (IV SOLUTION) ×3 IMPLANT

## 2021-02-18 NOTE — Anesthesia Procedure Notes (Signed)
Procedure Name: Intubation Date/Time: 02/18/2021 3:51 PM Performed by: Claris Che, CRNA Pre-anesthesia Checklist: Patient identified, Emergency Drugs available, Suction available, Patient being monitored and Timeout performed Patient Re-evaluated:Patient Re-evaluated prior to induction Oxygen Delivery Method: Circle system utilized Preoxygenation: Pre-oxygenation with 100% oxygen Induction Type: IV induction and Cricoid Pressure applied Ventilation: Mask ventilation without difficulty Laryngoscope Size: Mac and 3 Grade View: Grade II Tube type: Oral Tube size: 7.5 mm Number of attempts: 1 Airway Equipment and Method: Stylet Placement Confirmation: ETT inserted through vocal cords under direct vision, positive ETCO2 and breath sounds checked- equal and bilateral Secured at: 23 cm Tube secured with: Tape Dental Injury: Teeth and Oropharynx as per pre-operative assessment

## 2021-02-18 NOTE — Interval H&P Note (Signed)
History and Physical Interval Note:  02/18/2021 3:00 PM  Toni [name]  has presented today for surgery, with the diagnosis of Baclofen pump failure.  The various methods of treatment have been discussed with the patient and family. After consideration of risks, benefits and other options for treatment, the patient has consented to  Procedure(s) with comments: Baclofen Pump replacement with possible revision of catheter (N/A) - 3C/RM 20 as a surgical intervention.  The patient's history has been reviewed, patient examined, no change in status, stable for surgery.  I have reviewed the patient's chart and labs.  Questions were answered to the patient's satisfaction.     Peggyann Shoals

## 2021-02-18 NOTE — Anesthesia Postprocedure Evaluation (Signed)
Anesthesia Post Note  Patient: Toni [name]  Procedure(s) Performed: Baclofen Pump replacement with possible revision of catheter     Patient location during evaluation: PACU Anesthesia Type: General Level of consciousness: awake Pain management: pain level controlled Vital Signs Assessment: post-procedure vital signs reviewed and stable Respiratory status: spontaneous breathing Cardiovascular status: stable Postop Assessment: no apparent nausea or vomiting Anesthetic complications: no   No notable events documented.  Last Vitals:  Vitals:   02/18/21 1204 02/18/21 1718  BP: 119/71 104/65  Pulse: 64 69  Resp: 17 16  Temp: 36.7 C   SpO2: 98% 95%    Last Pain:  Vitals:   02/18/21 1229  TempSrc:   PainSc: 3                  Breaker Springer

## 2021-02-18 NOTE — Brief Op Note (Signed)
02/18/2021  5:05 PM  PATIENT:  Toni [name]  75 y.o. female  PRE-OPERATIVE DIAGNOSIS:  Baclofen pump failure  POST-OPERATIVE DIAGNOSIS:  baclofen pump failure  PROCEDURE:  Procedure(s) with comments: Baclofen Pump replacement with possible revision of catheter (N/A) - 3C/RM 20  SURGEON:  Surgeon(s) and Role:    Erline Levine, MD - Primary  PHYSICIAN ASSISTANT:   ASSISTANTS: Poteat, RN   ANESTHESIA:   general  [id number]  BLOOD ADMINISTERED:none  DRAINS: none   LOCAL MEDICATIONS USED:  MARCAINE    and LIDOCAINE   SPECIMEN:  No Specimen  DISPOSITION OF SPECIMEN:  N/A  COUNTS:  YES  TOURNIQUET:  * No tourniquets in log *  DICTATION: Patient has implanted baclofen pump for spasticity secondary to MS, which is now depleted.  It was elected for patient to undergo baclofen pump revision with possible catheter revision.  PROCEDURE: Patient was brought to the operating room and given LMA anesthesia.  She was placed in a lateral position on the beanbag abd her right flank and back were prepped with betadine scrub and Duraprep. Area of planned incision was infiltrated with marcaine.  Prior incision was reopened and the old baclofen pump was externalized.  The catheter was easily aspirated with brisk flow of CSF.  Catheter was disconnected. There was spontaneous flow of CSF.  The new baclofen pump was filled and connected to the catheter and was placed in the pocket.  The catheter was again aspirated with brisk return of CSF.  The new pump was then internalized.   Incision was closed with 2-0 Vicryl and 3-0 vicryl sutures and dressed with a sterile occlusive dressing.  Counts were correct at the end of the case.   PLAN OF CARE: Admit to inpatient   PATIENT DISPOSITION:  PACU - hemodynamically stable.   Delay start of Pharmacological VTE agent (>24hrs) due to surgical blood loss or risk of bleeding: yes

## 2021-02-18 NOTE — Transfer of Care (Signed)
Immediate Anesthesia Transfer of Care Note  Patient: Toni [name]  Procedure(s) Performed: Baclofen Pump replacement with possible revision of catheter  Patient Location: PACU  Anesthesia Type:General  Level of Consciousness: drowsy, patient cooperative and responds to stimulation  Airway & Oxygen Therapy: Patient Spontanous Breathing and Patient connected to nasal cannula oxygen  Post-op Assessment: Report given to RN, Post -op Vital signs reviewed and stable and Patient moving all extremities X 4  Post vital signs: Reviewed and stable  Last Vitals:  Vitals Value Taken Time  BP 104/65 02/18/21 1718  Temp    Pulse 68 02/18/21 1722  Resp 8 02/18/21 1722  SpO2 93 % 02/18/21 1722  Vitals shown include unvalidated device data.  Last Pain:  Vitals:   02/18/21 1229  TempSrc:   PainSc: 3       Patients Stated Pain Goal: 3 (59/74/71 8550)  Complications: No notable events documented.

## 2021-02-18 NOTE — Op Note (Signed)
02/18/2021  5:05 PM  PATIENT:  Toni [name]  75 y.o. female  PRE-OPERATIVE DIAGNOSIS:  Baclofen pump failure  POST-OPERATIVE DIAGNOSIS:  baclofen pump failure  PROCEDURE:  Procedure(s) with comments: Baclofen Pump replacement with possible revision of catheter (N/A) - 3C/RM 20  SURGEON:  Surgeon(s) and Role:    Erline Levine, MD - Primary  PHYSICIAN ASSISTANT:   ASSISTANTS: Poteat, RN   ANESTHESIA:   general  ANV:BTYOMAY  BLOOD ADMINISTERED:none  DRAINS: none   LOCAL MEDICATIONS USED:  MARCAINE    and LIDOCAINE   SPECIMEN:  No Specimen  DISPOSITION OF SPECIMEN:  N/A  COUNTS:  YES  TOURNIQUET:  * No tourniquets in log *  DICTATION: Patient has implanted baclofen pump for spasticity secondary to MS, which is now depleted.  It was elected for patient to undergo baclofen pump revision with possible catheter revision.  PROCEDURE: Patient was brought to the operating room and given LMA anesthesia.  She was placed in a lateral position on the beanbag abd her right flank and back were prepped with betadine scrub and Duraprep. Area of planned incision was infiltrated with marcaine.  Prior incision was reopened and the old baclofen pump was externalized.  The catheter was easily aspirated with brisk flow of CSF.  Catheter was disconnected. There was spontaneous flow of CSF.  The new baclofen pump was filled and connected to the catheter and was placed in the pocket.  The catheter was again aspirated with brisk return of CSF.  The new pump was then internalized.   Incision was closed with 2-0 Vicryl and 3-0 vicryl sutures and dressed with a sterile occlusive dressing.  Counts were correct at the end of the case.   PLAN OF CARE: Admit to inpatient   PATIENT DISPOSITION:  PACU - hemodynamically stable.   Delay start of Pharmacological VTE agent (>24hrs) due to surgical blood loss or risk of bleeding: yes

## 2021-02-18 NOTE — Progress Notes (Signed)
RT set up patient CPAP at beside. 5L O2 needed. Home regimen

## 2021-02-19 MED ORDER — RISAQUAD PO CAPS
1.0000 | ORAL_CAPSULE | Freq: Every day | ORAL | Status: DC
  Administered 2021-02-19: 1 via ORAL
  Filled 2021-02-19: qty 1

## 2021-02-19 NOTE — Progress Notes (Signed)
  NEUROSURGERY PROGRESS NOTE   No issues overnight. No complaints this am.  EXAM:  BP 107/64 (BP Location: Right Arm)   Pulse 94   Temp 99.2 F (37.3 C) (Oral)   Resp 16   Ht 5\' 2"  (1.575 m)   Wt 90.7 kg   SpO2 97%   BMI 36.57 kg/m   MAE, minimal spasticity Abd wound c/d/i  IMPRESSION:  75 y.o. female POD#1 s/p baclofen pump revision, appears to be at baseline  PLAN: - Cont supportive care - Pt and daughter prefer to transfer back to facility tomorrow.   Consuella Lose, MD [facility]

## 2021-02-19 NOTE — Plan of Care (Signed)

## 2021-02-19 NOTE — Evaluation (Signed)
Occupational Therapy Evaluation Patient Details Name: Toni [name] MRN: [id number] DOB: 20-Apr-1946 Today's Date: 02/19/2021    History of Present Illness The pt is a 75 yo female presenting 7/1 for Baclofen Pump replacement surgery. PMH includes: arthritis, cancer, CHF, HLD< HTN, MS, and cervical fusion.   Clinical Impression   Patient is s/p baclofen pump surgery resulting in functional limitations due to the deficits listed below (see OT problem list). Pt complete bed mobility and then hoyer lift oob to chair this session. Recommendation for hoyer due to pump placement upon d/c until cleared by MD Patient will benefit from skilled OT acutely to increase independence and safety with ADLS to allow discharge SNF.     Follow Up Recommendations  SNF    Equipment Recommendations  None recommended by OT    Recommendations for Other Services       Precautions / Restrictions Precautions Precautions: Fall;Back Precaution Comments: incision on R abdomen flank Restrictions Weight Bearing Restrictions: Yes      Mobility Bed Mobility Overal bed mobility: Needs Assistance Bed Mobility: Rolling Rolling: Mod assist;+2 for safety/equipment         General bed mobility comments: modA of 2 for x4 rolling in bed. pt able to initiate movement and reach with UE, but needing modA to complete. increased cues for sequencing.    Transfers Overall transfer level: Needs assistance Equipment used: Ambulation equipment used             General transfer comment: maximove to transfer from bed to recliner.    Balance Overall balance assessment: Needs assistance     Sitting balance - Comments: reliant on posterior support, unable to pull forward in recliner and maintain without maxA                                   ADL either performed or assessed with clinical judgement   ADL Overall ADL's : Needs assistance/impaired Eating/Feeding: Set up;Sitting Eating/Feeding  Details (indicate cue type and reason): with build up utensils Grooming: Wash/dry hands;Supervision/safety;Sitting               Lower Body Dressing: Total assistance   Toilet Transfer: +2 for physical assistance;Minimal assistance Toilet Transfer Details (indicate cue type and reason): bed pan           General ADL Comments: Pt at baseline requires the sara plus but can not use it due to incision at SNF. recommend the use of hoyer and then replaced while in sitting     Vision         Perception     Praxis      Pertinent Vitals/Pain Pain Assessment: Faces Faces Pain Scale: Hurts a little bit Pain Location: generalized Pain Descriptors / Indicators: Grimacing Pain Intervention(s): Monitored during session     Hand Dominance Right   Extremity/Trunk Assessment Upper Extremity Assessment Upper Extremity Assessment: Generalized weakness   Lower Extremity Assessment Lower Extremity Assessment: Generalized weakness   Cervical / Trunk Assessment Cervical / Trunk Assessment: Other exceptions Cervical / Trunk Exceptions: large body habitus   Communication Communication Communication: No difficulties   Cognition Arousal/Alertness: Awake/alert Behavior During Therapy: WFL for tasks assessed/performed Overall Cognitive Status: Within Functional Limits for tasks assessed  General Comments  VSS    Exercises     Shoulder Instructions      Home Living Family/patient expects to be discharged to:: Skilled nursing facility                                 Additional Comments: Pt from rehab, willing to return for rehab when medically ready      Prior Functioning/Environment Level of Independence: Needs assistance  Gait / Transfers Assistance Needed: sit-stand with sara plus style equipment at SNF, needing assist of lift for transfers to bathroom equipment ADL's / Homemaking Assistance Needed: assist  for dressing, bathing, and using grips on utensils to improve ability to feed herself.            OT Problem List: Decreased activity tolerance;Impaired balance (sitting and/or standing);Decreased knowledge of use of DME or AE;Decreased knowledge of precautions      OT Treatment/Interventions: Self-care/ADL training;DME and/or AE instruction;Balance training;Patient/family education;Therapeutic activities    OT Goals(Current goals can be found in the care plan section) Acute Rehab OT Goals Patient Stated Goal: to go to the bathroom OT Goal Formulation: With patient/family Time For Goal Achievement: 03/05/21 Potential to Achieve Goals: Good  OT Frequency: Min 2X/week   Barriers to D/C:            Co-evaluation PT/OT/SLP Co-Evaluation/Treatment: Yes Reason for Co-Treatment: Complexity of the patient's impairments (multi-system involvement);Necessary to address cognition/behavior during functional activity;For patient/therapist safety;To address functional/ADL transfers PT goals addressed during session: Mobility/safety with mobility;Balance;Strengthening/ROM OT goals addressed during session: ADL's and self-care;Proper use of Adaptive equipment and DME;Strengthening/ROM      AM-PAC OT "6 Clicks" Daily Activity     Outcome Measure Help from another person eating meals?: A Little Help from another person taking care of personal grooming?: A Little Help from another person toileting, which includes using toliet, bedpan, or urinal?: A Lot Help from another person bathing (including washing, rinsing, drying)?: A Lot Help from another person to put on and taking off regular upper body clothing?: A Little Help from another person to put on and taking off regular lower body clothing?: A Lot 6 Click Score: 15   End of Session Nurse Communication: Mobility status;Precautions  Activity Tolerance: Patient tolerated treatment well Patient left: in chair;with call bell/phone within  reach;with chair alarm set;with family/visitor present  OT Visit Diagnosis: Unsteadiness on feet (R26.81)                Time: 4268-3419 OT Time Calculation (min): 41 min Charges:  OT General Charges $OT Visit: 1 Visit OT Evaluation $OT Eval Moderate Complexity: 1 Mod   Brynn, OTR/L  Acute Rehabilitation Services Pager: [phone] Office: [phone] .   Jeri Modena 02/19/2021, 3:00 PM

## 2021-02-19 NOTE — Evaluation (Signed)
Physical Therapy Evaluation Patient Details Name: Toni [name] MRN: [id number] DOB: 1945/10/23 Today's Date: 02/19/2021   History of Present Illness  The pt is a 75 yo female presenting 7/1 for Baclofen Punp replacement surgery. PMH includes: arthritis, cancer, CHF, HLD< HTN, MS, and cervical fusion.   Clinical Impression  Pt in bed upon arrival of PT, agreeable to evaluation at this time. Prior to admission the pt was at a SNF where she required assist from staff for all transfers, mobility, and self-care, and was working with therapy. The pt now presents with limitations in functional mobility, strength, ROM, stability, and activity tolerance due to above dx, and will continue to benefit from skilled PT to address these deficits. The pt was able to complete multiple rolls in the bed with good ability to assist with BLE and BUE using bed rails. The pt required maximove lift to complete transfer to recliner, but is motivated to increase frequency of transfers out of bed. The pt will benefit from skilled PT acutely as well as following d/c to maximize functional recovery, strength, and decrease caregiver burden.      Follow Up Recommendations SNF;Supervision/Assistance - 24 hour    Equipment Recommendations   (defer to post acute)    Recommendations for Other Services       Precautions / Restrictions Precautions Precautions: Fall;Back Restrictions Weight Bearing Restrictions: No      Mobility  Bed Mobility Overal bed mobility: Needs Assistance Bed Mobility: Rolling Rolling: Mod assist;+2 for safety/equipment         General bed mobility comments: modA of 2 for x4 rolling in bed. pt able to initiate movement and reach with UE, but needing modA to complete. increased cues for sequencing.    Transfers Overall transfer level: Needs assistance Equipment used: Ambulation equipment used             General transfer comment: maximove to transfer from bed to  recliner.  Ambulation/Gait                Stairs            Wheelchair Mobility    Modified Rankin (Stroke Patients Only)       Balance Overall balance assessment: Needs assistance     Sitting balance - Comments: reliant on posterior support, unable to pull forward in recliner and maintain without maxA                                     Pertinent Vitals/Pain Pain Assessment: Faces Faces Pain Scale: Hurts a little bit Pain Location: generalized Pain Descriptors / Indicators: Grimacing Pain Intervention(s): Monitored during session    Home Living Family/patient expects to be discharged to:: Skilled nursing facility                 Additional Comments: Pt from rehab, willing to return for rehab when medically ready    Prior Function Level of Independence: Needs assistance   Gait / Transfers Assistance Needed: sit-stand with sara plus style equipment at SNF, needing assist of lift for transfers to bathroom equipment  ADL's / Homemaking Assistance Needed: assist for dressing, bathing, and using grips on utensils to improve ability to feed herself.        Hand Dominance   Dominant Hand: Right    Extremity/Trunk Assessment   Upper Extremity Assessment Upper Extremity Assessment: Defer to OT evaluation    Lower  Extremity Assessment Lower Extremity Assessment: Generalized weakness (pt able to demo partial ROM against gravity, unable to achieve full extension in either knee)    Cervical / Trunk Assessment Cervical / Trunk Assessment: Other exceptions Cervical / Trunk Exceptions: large body habitus  Communication   Communication: No difficulties  Cognition Arousal/Alertness: Awake/alert Behavior During Therapy: WFL for tasks assessed/performed Overall Cognitive Status: Within Functional Limits for tasks assessed                                        General Comments General comments (skin integrity, edema,  etc.): VSS on RA     PT Assessment Patient needs continued PT services  PT Problem List Decreased strength;Decreased range of motion;Decreased activity tolerance;Decreased balance;Decreased mobility       PT Treatment Interventions DME instruction;Gait training;Stair training;Therapeutic activities;Functional mobility training;Therapeutic exercise;Balance training;Patient/family education    PT Goals (Current goals can be found in the Care Plan section)  Acute Rehab PT Goals Patient Stated Goal: go to the bathroom PT Goal Formulation: With patient Time For Goal Achievement: 03/05/21 Potential to Achieve Goals: Good    Frequency Min 3X/week   Barriers to discharge        Co-evaluation PT/OT/SLP Co-Evaluation/Treatment: Yes Reason for Co-Treatment: Complexity of the patient's impairments (multi-system involvement);To address functional/ADL transfers;For patient/therapist safety PT goals addressed during session: Mobility/safety with mobility;Balance;Strengthening/ROM         AM-PAC PT "6 Clicks" Mobility  Outcome Measure Help needed turning from your back to your side while in a flat bed without using bedrails?: A Lot Help needed moving from lying on your back to sitting on the side of a flat bed without using bedrails?: Total Help needed moving to and from a bed to a chair (including a wheelchair)?: Total Help needed standing up from a chair using your arms (e.g., wheelchair or bedside chair)?: Total Help needed to walk in hospital room?: Total Help needed climbing 3-5 steps with a railing? : Total 6 Click Score: 7    End of Session Equipment Utilized During Treatment:  (lift) Activity Tolerance: Patient tolerated treatment well Patient left: in chair;with call bell/phone within reach;with chair alarm set;with family/visitor present Nurse Communication: Mobility status;Need for lift equipment PT Visit Diagnosis: Other abnormalities of gait and mobility (R26.89);Muscle  weakness (generalized) (M62.81)    Time: 1251-1330 PT Time Calculation (min) (ACUTE ONLY): 39 min   Charges:   PT Evaluation $PT Eval Moderate Complexity: 1 Mod PT Treatments $Therapeutic Activity: 8-22 mins        Karma Ganja, PT, DPT   Acute Rehabilitation Department Pager #: [phone]  Otho Bellows 02/19/2021, 2:43 PM

## 2021-02-20 MED ORDER — TRAMADOL HCL 50 MG PO TABS: 50.0000 mg | ORAL_TABLET | Freq: Four times a day (QID) | ORAL | 0 refills | Status: DC | PRN

## 2021-02-20 MED ORDER — FLEET ENEMA 7-19 GM/118ML RE ENEM
1.0000 | ENEMA | Freq: Once | RECTAL | Status: AC
  Administered 2021-02-20: 1 via RECTAL
  Filled 2021-02-20: qty 1

## 2021-02-20 NOTE — Progress Notes (Signed)
Pt with discharge orders, report given to [facility], all questions answered. IV's removed. Pt picked up by [facility] for transport to facility.

## 2021-02-20 NOTE — TOC Initial Note (Addendum)
Transition of Care [facility]) - Initial/Assessment Note    Patient Details  Name: Toni [name] MRN: [id number] Date of Birth: 1945-10-15  Transition of Care [facility] CM/SW Contact:    Oretha Milch, LCSW Phone Number: 02/20/2021, 10:46 AM  Clinical Narrative:                 CSW noted PT recommendation for SNF. Patient initially arrived from [facility]. CSW introduced self and role to patient. Patient reported she wanted to return back to [facility] and that she had discussed it with them before she came to [facility] for her surgery. CSW noted patient requested referral back to [facility] to transfer back when medically cleared. CSW will side facility updated records and reach out to discuss transfer back if possible. CSW inquired if she had additional questions or concerns and none at this time.   11:17a: CSW spoke with admissions at [facility] and noted patient can return when discharged and for RN to call for RN report to facility at [phone].   Expected Discharge Plan: Skilled Nursing Facility Barriers to Discharge: Continued Medical Work up   Patient Goals and CMS Choice Patient states their goals for this hospitalization and ongoing recovery are:: "I want to go back." CMS Medicare.gov Compare Post Acute Care list provided to:: Patient Choice offered to / list presented to : Patient  Expected Discharge Plan and Services Expected Discharge Plan: [place] Choice: [place] Living arrangements for the past 2 months: [place]                                      Prior Living Arrangements/Services Living arrangements for the past 2 months: [place] Lives with:: Facility Resident Patient language and need for interpreter reviewed:: Yes Do you feel safe going back to the place where you live?: Yes      Need for Family Participation in Patient Care: No (Comment) Care giver support  system in place?: Yes (comment)   Criminal Activity/Legal Involvement Pertinent to Current Situation/Hospitalization: No - Comment as needed  Activities of Daily Living   ADL Screening (condition at time of admission) Patient's cognitive ability adequate to safely complete daily activities?: Yes Is the patient deaf or have difficulty hearing?: No Does the patient have difficulty seeing, even when wearing glasses/contacts?: No Does the patient have difficulty concentrating, remembering, or making decisions?: No Patient able to express need for assistance with ADLs?: Yes Does the patient have difficulty dressing or bathing?: No Independently performs ADLs?: Yes (appropriate for developmental age) Does the patient have difficulty walking or climbing stairs?: No Weakness of Legs: None Weakness of Arms/Hands: None  Permission Sought/Granted Permission sought to share information with : Facility Art therapist granted to share information with : Yes, Verbal Permission Granted              Emotional Assessment Appearance:: Appears stated age Attitude/Demeanor/Rapport: Engaged Affect (typically observed): Appropriate, Calm Orientation: : Oriented to Situation, Oriented to  Time, Oriented to Place, Oriented to Self Alcohol / Substance Use: Not Applicable Psych Involvement: No (comment)  Admission diagnosis:  Spasticity [R25.2] Patient Active Problem List   Diagnosis Date Noted   Spasticity 02/18/2021   Bladder spasms 01/04/2021   UTI (urinary tract infection) 01/03/2021   Bilateral carpal tunnel syndrome 10/04/2020   Multiple sclerosis, 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[place])  09/13/2020   Chronic intermittent hypoxia with obstructive sleep apnea 09/13/2020   COVID-19 long hauler manifesting chronic dyspnea 09/13/2020   Cushing's syndrome, ACTH dependent ([place]) 09/13/2020   Chronic diastolic CHF (congestive heart failure) ([place]) 09/13/2020   Acute respiratory failure with  hypoxia ([place]) 09/13/2020   Class 2 severe obesity with serious comorbidity and body mass index (BMI) of 35.0 to 35.9 in adult ([place]) 09/13/2020   Sepsis ([place]) 06/17/2535   Acute metabolic encephalopathy 64/40/3474   Atrial fibrillation with RVR ([place]) 04/07/2020   Recurrent UTI 25/95/6387   Uncomplicated degenerative myopia of both eyes 03/19/2020   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/2020   Oxygen dependent 02/26/2020   Neurogenic hypoventilation 02/26/2020   Complex atypical endometrial hyperplasia    Acute on chronic respiratory failure with hypoxia ([place]) 10/10/2019   Multiple sclerosis ([place]) 10/10/2019   COVID-19 virus infection 08/23/2019   Bilateral leg weakness 08/04/2019   Weakness 08/04/2019   Unilateral primary osteoarthritis, left knee 01/09/2019   Type 2 diabetes mellitus without complication, without long-term current use of insulin ([place]) 08/27/2018   Obstructive sleep apnea treated with bilevel positive airway pressure (BiPAP) 08/15/2018   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 ([place]) 11/21/2017   Permanent atrial fibrillation ([place]) 11/21/2017   Tightness of leg fascia 11/21/2017   Muscle spasm of both lower legs 11/21/2017   Carpal tunnel syndrome, left upper limb 09/13/2017   Carpal tunnel syndrome, right upper limb 09/13/2017   Other insomnia 06/11/2017   Depression 06/11/2017   Vitamin D deficiency 03/22/2017   Right hand weakness 08/06/2016   Nonischemic cardiomyopathy ([place]) 05/09/2016   Localized edema 05/09/2016   Long term current use of anticoagulant therapy 02/03/2016   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   Obesity hypoventilation syndrome ([place]) 03/30/2015   Abnormality of gait 03/01/2015   Chronic constipation with overflow incontinence 01/25/2015   ACTH dependent Cushing's syndrome ([place]) 01/25/2015   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) ([place]) 01/25/2015   Neurogenic urinary bladder  disorder 08/24/2014   Varicose veins of both lower extremities with complications 56/43/3295   Long term (current) use of anticoagulants 11/05/2012   Pseudophakia of both eyes 02/07/2012   Multiple sclerosis, 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 11/26/2011   Essential hypertension    Atrial fibrillation ([place])    Thyroid disease    HLD (hyperlipidemia)    PCP:  Prince Solian, MD Pharmacy:   [facility] [place], [place] - [address] AT [facility] [place] [address] Phone: [phone] Fax: [phone]  [facility]  ([place]) - [place], [address] [place] [address] Phone: [phone] Fax: [phone]     Social Determinants of Health (SDOH) Interventions    Readmission Risk Interventions Readmission Risk Prevention Plan 06/11/2020  Transportation Screening Complete  PCP or Specialist appointment within 3-5 days of discharge Not Complete  PCP/Specialist Appt Not Complete comments MD office reports they have to call the pt and schedule this appt themselves when they receive notice of [place] or Home Care Consult Complete  SW Recovery Care/Counseling Consult Complete  Palliative Care Screening Not [place] Not Applicable  Some recent data might be hidden

## 2021-02-20 NOTE — NC FL2 (Signed)
[place] LEVEL OF CARE SCREENING TOOL     IDENTIFICATION  Patient Name: Toni [name] Birthdate: 1946-07-05 Sex: female Admission Date (Current Location): 02/18/2021  [facility] and [place] Number:  Herbalist and Address:  [facility] [place]. [facility], [place] [address], [place], [place] [address]      Provider Number: [id number]  Attending Physician Name and Address:  Erline Levine, MD  Relative Name and Phone Number:  Jolan Mealor, [phone]    Current Level of Care: Hospital Recommended Level of Care: [place] Prior Approval Number:    Date Approved/Denied:   PASRR Number: [id number]  Discharge Plan: SNF    Current Diagnoses: Patient Active Problem List   Diagnosis Date Noted   Spasticity 02/18/2021   Bladder spasms 01/04/2021   UTI (urinary tract infection) 01/03/2021   Bilateral carpal tunnel syndrome 10/04/2020   Multiple sclerosis, 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[place]) 09/13/2020   Chronic intermittent hypoxia with obstructive sleep apnea 09/13/2020   COVID-19 long hauler manifesting chronic dyspnea 09/13/2020   Cushing's syndrome, ACTH dependent ([place]) 09/13/2020   Chronic diastolic CHF (congestive heart failure) ([place]) 09/13/2020   Acute respiratory failure with hypoxia ([place]) 09/13/2020   Class 2 severe obesity with serious comorbidity and body mass index (BMI) of 35.0 to 35.9 in adult ([place]) 09/13/2020   Sepsis ([place]) 65/46/5035   Acute metabolic encephalopathy 46/56/8127   Atrial fibrillation with RVR ([place]) 04/07/2020   Recurrent UTI 51/70/0174   Uncomplicated degenerative myopia of both eyes 03/19/2020   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/2020   Oxygen dependent 02/26/2020   Neurogenic hypoventilation 02/26/2020   Complex atypical endometrial hyperplasia    Acute on chronic respiratory failure with hypoxia ([place]) 10/10/2019   Multiple sclerosis ([place]) 10/10/2019   COVID-19 virus infection 08/23/2019   Bilateral leg  weakness 08/04/2019   Weakness 08/04/2019   Unilateral primary osteoarthritis, left knee 01/09/2019   Type 2 diabetes mellitus without complication, without long-term current use of insulin ([place]) 08/27/2018   Obstructive sleep apnea treated with bilevel positive airway pressure (BiPAP) 08/15/2018   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 ([place]) 11/21/2017   Permanent atrial fibrillation ([place]) 11/21/2017   Tightness of leg fascia 11/21/2017   Muscle spasm of both lower legs 11/21/2017   Carpal tunnel syndrome, left upper limb 09/13/2017   Carpal tunnel syndrome, right upper limb 09/13/2017   Other insomnia 06/11/2017   Depression 06/11/2017   Vitamin D deficiency 03/22/2017   Right hand weakness 08/06/2016   Nonischemic cardiomyopathy ([place]) 05/09/2016   Localized edema 05/09/2016   Long term current use of anticoagulant therapy 02/03/2016   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   Obesity hypoventilation syndrome ([place]) 03/30/2015   Abnormality of gait 03/01/2015   Chronic constipation with overflow incontinence 01/25/2015   ACTH dependent Cushing's syndrome ([place]) 01/25/2015   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) ([place]) 01/25/2015   Neurogenic urinary bladder disorder 08/24/2014   Varicose veins of both lower extremities with complications 94/49/6759   Long term (current) use of anticoagulants 11/05/2012   Pseudophakia of both eyes 02/07/2012   Multiple sclerosis, 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 11/26/2011   Essential hypertension    Atrial fibrillation (HCC)    Thyroid disease    HLD (hyperlipidemia)     Orientation RESPIRATION BLADDER Height & Weight     Self, Time, Situation, Place  Normal  (purewick) Weight: 199 lb 15.3 oz (90.7 kg) Height:  5\' 2"  (  157.5 cm)  BEHAVIORAL SYMPTOMS/MOOD NEUROLOGICAL BOWEL NUTRITION STATUS      Continent    AMBULATORY STATUS COMMUNICATION OF NEEDS Skin    Limited Assist Verbally Surgical wounds                       Personal Care Assistance Level of Assistance  Bathing, Feeding, Dressing Bathing Assistance: Maximum assistance Feeding assistance: Limited assistance Dressing Assistance: Limited assistance     Functional Limitations Info             SPECIAL CARE FACTORS FREQUENCY  PT (By licensed PT), OT (By licensed OT)     PT Frequency: 5x weekly OT Frequency: 5x weekly            Contractures Contractures Info: Not present    Additional Factors Info  Code Status, Allergies Code Status Info: Full Allergies Info: sertraline, hydrocodone, oxycodone, chlorhexidine           Current Medications (02/20/2021):  This is the current hospital active medication list Current Facility-Administered Medications  Medication Dose Route Frequency Provider Last Rate Last Admin   0.9 %  sodium chloride infusion  250 mL Intravenous Continuous Erline Levine, MD       acetaminophen (TYLENOL) tablet 650 mg  650 mg Oral Q4H PRN Erline Levine, MD   650 mg at 02/19/21 1900   Or   acetaminophen (TYLENOL) suppository 650 mg  650 mg Rectal Q4H PRN Erline Levine, MD       acidophilus (RISAQUAD) capsule 1 capsule  1 capsule Oral Q supper Erline Levine, MD   1 capsule at 02/19/21 1646   ALPRAZolam Duanne Moron) tablet 0.5 mg  0.5 mg Oral TID PRN Erline Levine, MD       alum & mag hydroxide-simeth (MAALOX/MYLANTA) 200-200-20 MG/5ML suspension 30 mL  30 mL Oral Q6H PRN Erline Levine, MD       buPROPion (WELLBUTRIN XL) 24 hr tablet 150 mg  150 mg Oral QPC lunch Erline Levine, MD   150 mg at 02/19/21 1334   calcium-vitamin D (OSCAL WITH D) 500-200 MG-UNIT per tablet 1 tablet  1 tablet Oral BID WC Donnamae Jude, [facility]   1 tablet at 02/20/21 8242   cholecalciferol (VITAMIN D3) tablet 2,000 Units  2,000 Units Oral BID WC Erline Levine, MD   2,000 Units at 02/20/21 0852   dextrose 5 % and 0.45 % NaCl with KCl 20 mEq/L infusion   Intravenous Continuous Erline Levine, MD   Stopped at 02/19/21 1259   diclofenac Sodium (VOLTAREN) 1 % topical gel 2 g  2 g Topical TID PRN Erline Levine, MD       diltiazem (CARDIZEM CD) 24 hr capsule 240 mg  240 mg Oral QPC breakfast Erline Levine, MD   240 mg at 02/20/21 0851   docusate sodium (COLACE) capsule 100 mg  100 mg Oral QHS Erline Levine, MD   100 mg at 02/19/21 2116   escitalopram (LEXAPRO) tablet 10 mg  10 mg Oral QHS Erline Levine, MD   10 mg at 02/19/21 2116   famotidine (PEPCID) tablet 20 mg  20 mg Oral QHS Erline Levine, MD   20 mg at 02/19/21 2116   furosemide (LASIX) tablet 40 mg  40 mg Oral BID Erline Levine, MD   40 mg at 02/20/21 0851   gabapentin (NEURONTIN) capsule 300 mg  300 mg Oral Q6H Erline Levine, MD   300 mg at 02/20/21 0504   lactulose (Carbon) 10 GM/15ML  solution 10 g  10 g Oral BID PRN Erline Levine, MD   10 g at 02/19/21 0859   levothyroxine (SYNTHROID) tablet 75 mcg  75 mcg Oral Q0600 Erline Levine, MD   75 mcg at 02/20/21 0503   linaclotide (LINZESS) capsule 145 mcg  145 mcg Oral Q0600 Erline Levine, MD   145 mcg at 02/20/21 0504   loratadine (CLARITIN) tablet 10 mg  10 mg Oral Daily Erline Levine, MD   10 mg at 02/20/21 0851   LORazepam (ATIVAN) 2 MG/ML concentrated solution 0.5 mg  0.5 mg Oral Daily PRN Erline Levine, MD       melatonin tablet 5 mg  5 mg Oral QHS Erline Levine, MD   5 mg at 02/19/21 2116   menthol-cetylpyridinium (CEPACOL) lozenge 3 mg  1 lozenge Oral PRN Erline Levine, MD       Or   phenol [facility]) mouth spray 1 spray  1 spray Mouth/Throat PRN Erline Levine, MD       midodrine (PROAMATINE) tablet 5 mg  5 mg Oral Daily PRN Erline Levine, MD       multivitamin with minerals tablet 1 tablet  1 tablet Oral QPC lunch Erline Levine, MD   1 tablet at 02/19/21 1334   neomycin-bacitracin-polymyxin (NEOSPORIN) ointment packet 1 application  1 application Topical BID PRN Erline Levine, MD       omega-3 acid ethyl esters (LOVAZA) capsule 1 g  1 g Oral Daily Erline Levine, MD   1 g at 02/20/21 0851   ondansetron (ZOFRAN) tablet 4 mg  4 mg Oral Q6H PRN Erline Levine, MD   4 mg at 02/19/21 1137   Or   ondansetron (ZOFRAN) injection 4 mg  4 mg Intravenous Q6H PRN Erline Levine, MD       oxybutynin [facility]) tablet 5 mg  5 mg Oral TID PRN Erline Levine, MD       pantoprazole (PROTONIX) EC tablet 40 mg  40 mg Oral Daily Erline Levine, MD   40 mg at 02/20/21 3151   polyvinyl alcohol (LIQUIFILM TEARS) 1.4 % ophthalmic solution 1 drop  1 drop Both Eyes PRN Donnamae Jude, RPH       potassium chloride SA (KLOR-CON) CR tablet 20 mEq  20 mEq Oral BID WC Erline Levine, MD   20 mEq at 02/20/21 0851   psyllium (HYDROCIL/METAMUCIL) 1 packet  1 packet Oral QHS Erline Levine, MD   1 packet at 02/19/21 2119   simvastatin (ZOCOR) tablet 10 mg  10 mg Oral QHS Erline Levine, MD   10 mg at 02/19/21 2116   sodium chloride flush (NS) 0.9 % injection 3 mL  3 mL Intravenous Q12H Erline Levine, MD   3 mL at 02/19/21 2119   sodium chloride flush (NS) 0.9 % injection 3 mL  3 mL Intravenous PRN Erline Levine, MD       sodium phosphate (FLEET) 7-19 GM/118ML enema 1 enema  1 enema Rectal Once Vallarie Mare, MD       traMADol Veatrice Bourbon) tablet 50 mg  50 mg Oral Q6H PRN Erline Levine, MD   50 mg at 02/19/21 1900   zinc oxide 20 % ointment   Topical TID PRN Donnamae Jude, [facility]         Discharge Medications: Please see discharge summary for a list of discharge medications.  Relevant Imaging Results:  Relevant Lab Results:   Additional Information [phone], COVID moderna 08/19/2019, 09/16/2019  Oretha Milch, LCSW

## 2021-02-20 NOTE — Progress Notes (Signed)
RT came to place patient on CPAP hs. Patient is already on her home unit tolerating well.

## 2021-02-20 NOTE — Discharge Summary (Signed)
Physician Discharge Summary  Patient ID: Toni [name] MRN: [id number] DOB/AGE: January 28, 1946 75 y.o.  Admit date: 02/18/2021 Discharge date: 02/20/2021  Admission Diagnoses:  Spasticity  Discharge Diagnoses:  Same Active Problems:   Spasticity   Discharged Condition: Stable  Hospital Course:  Toni [name] is a 75 y.o. female who underwent baclofen pump replacement on 02/18/2021.  Postoperatively, she was admitted to the hospital.  There, her pain was well controlled with p.o. pain medications.  She had an episode of mild hypotension, and she takes midodrine for chronic hypotension.  She also complained of chronic constipation for which she was given an enema.  She was deemed ready for discharge back to her facility on 02/20/2021.  Discharge instructions were given to the patient and her caretaker.  Treatments: Surgery -baclofen pump replacement  Discharge Exam: Blood pressure (!) 125/57, pulse 65, temperature 98.6 F (37 C), temperature source Axillary, resp. rate 14, height 5\' 2"  (1.575 m), weight 90.7 kg, SpO2 97 %. Awake, alert, oriented Speech fluent, appropriate CN grossly intact MAE, minimal spasticity Abd wound c/d/i   Disposition: Discharge disposition: 03-Skilled Nursing Facility        Allergies as of 02/20/2021       Reactions   Other Rash   Topical Surgical prep  - severe rash   Chlorhexidine Hives, Other (See Comments)   And, sores appear where applied   Hydrocodone Nausea And Vomiting, Other (See Comments)   Hydrocodone causes vomiting Hydrocodone causes vomiting   Oxycodone Nausea And Vomiting, Other (See Comments)   Oral oxycodone causes vomiting   Sertraline Hives, Swelling, Rash, Other (See Comments)   [DERM][OTHER]RASH WITH SWELLING        Medication List     TAKE these medications    acetaminophen 500 MG tablet Commonly known as: TYLENOL Take 1,000 mg by mouth every 6 (six) hours as needed (fever/headache/pain).   Artificial Tears  1-0.3 % Soln Generic drug: Propylene Glycol-Glycerin Place 2 drops into both eyes daily as needed (dry eyes.).   Biotin 10000 MCG Tabs Take 10,000 mcg by mouth daily after lunch.   Calcium Carbonate-Vitamin D3 600-400 MG-UNIT Tabs Take 1 tablet by mouth in the morning and at bedtime. (After breakfast & after lunch)   cetirizine 10 MG tablet Commonly known as: ZYRTEC Take 10 mg by mouth daily as needed for allergies.   diltiazem 240 MG 24 hr capsule Commonly known as: DILACOR XR Take 240 mg by mouth daily after breakfast.   famotidine 20 MG tablet Commonly known as: PEPCID Take 20 mg by mouth at bedtime.   Fish Oil 1000 MG Caps Take 1,000 mg by mouth daily after supper.   lactobacillus acidophilus Tabs tablet Take 1 tablet by mouth daily after supper.   lactulose 10 GM/15ML solution Commonly known as: CHRONULAC Take 10 g by mouth 2 (two) times daily as needed (constipation).   levothyroxine 75 MCG tablet Commonly known as: SYNTHROID Take 75 mcg by mouth daily before breakfast. (0500)   LORazepam 2 MG/ML concentrated solution Commonly known as: ATIVAN Take 0.5 mg by mouth daily as needed (severe muscle cramps).   melatonin 5 MG Tabs Take 5 mg by mouth at bedtime.   multivitamin with minerals Tabs tablet Take 1 tablet by mouth daily after lunch.   neomycin-bacitracin-polymyxin ointment Commonly known as: NEOSPORIN Apply 1 application topically 2 (two) times daily as needed for wound care (applied to nose (wear mask rubs)).   NON FORMULARY ASV breathing machine is to be used  while napping & at bedtime. Fill canister up to max line with distilled water and turn concentrator to 5LPM.   omeprazole 20 MG capsule Commonly known as: PRILOSEC Take 20 mg by mouth daily before breakfast. (0500)   ondansetron 8 MG tablet Commonly known as: ZOFRAN Take 8 mg by mouth daily as needed for nausea or vomiting.   oxybutynin 5 MG tablet Commonly known as: DITROPAN Take 5 mg  by mouth 3 (three) times daily as needed for bladder spasms.   psyllium 28 % packet Commonly known as: METAMUCIL SMOOTH TEXTURE Take 1 packet by mouth at bedtime.   Remedy Calazime 0.4-20.5 % Pste Generic drug: Menthol-Zinc Oxide Apply 1 application topically 3 (three) times daily as needed (buttocks/sacrum with incontinences care episodes).   simvastatin 10 MG tablet Commonly known as: ZOCOR Take 10 mg by mouth at bedtime.   SYRINGE/NEEDLE (DISP) 1 ML 25G X 5/8" 1 ML Misc Commonly known as: BD Luer-Lok Syringe Use as directed to inject  Acthar   Vitamin D3 50 MCG (2000 UT) Tabs Take 2,000 Units by mouth in the morning and at bedtime. (After breakfast & after lunch)   zinc oxide 20 % ointment Apply 1 application topically as needed for irritation (apply labia every shift as needed for redness).       ASK your doctor about these medications    ALPRAZolam 0.5 MG tablet Commonly known as: XANAX TAKE 1 TABLET BY MOUTH 3  TIMES DAILY AS NEEDED FOR  ANXIETY AND SLEEP   baclofen 20 MG tablet Commonly known as: LIORESAL Take 1 tablet (20 mg total) by mouth 4 (four) times daily as needed for muscle spasms.   buPROPion 150 MG 24 hr tablet Commonly known as: WELLBUTRIN XL Take 1 tablet (150 mg total) by mouth daily.   diclofenac Sodium 1 % Gel Commonly known as: Voltaren Apply 2 g topically 3 (three) times daily as needed.   docusate sodium 100 MG capsule Commonly known as: COLACE Take 1 capsule (100 mg total) by mouth daily.   escitalopram 10 MG tablet Commonly known as: Lexapro Take 1 tablet (10 mg total) by mouth daily.   furosemide 20 MG tablet Commonly known as: LASIX Take 2 tablets (40 mg total) by mouth in the morning, at noon, and at bedtime.   gabapentin 300 MG capsule Commonly known as: NEURONTIN TAKE 1 CAPSULE BY MOUTH UP  TO 4 TIMES DAILY   linaclotide 145 MCG Caps capsule Commonly known as: LINZESS Take 1 capsule (145 mcg total) by mouth daily before  breakfast.   midodrine 5 MG tablet Commonly known as: PROAMATINE Take 1 tablet (5 mg total) by mouth 3 (three) times daily with meals.   potassium chloride SA 20 MEQ tablet Commonly known as: KLOR-CON Take 2 tablets (40 mEq total) by mouth daily.   warfarin 5 MG tablet Commonly known as: COUMADIN Take as directed. If you are unsure how to take this medication, talk to your nurse or doctor. Original instructions: TAKE 1/2 TO 1 TABLET BY  MOUTH DAILY AS DIRECTED BY  COUMADIN CLINIC         Signed: Vallarie [name] 02/20/2021, 11:20 AM

## 2021-02-20 NOTE — Discharge Instructions (Addendum)
Can remove transparent dressing and shower 48 hours after surgery No heavy lifting >  20 lbs  Sit to stand lift as tolerated Oral Baclofen can be taken as needed for spasticity If patient needs to resume Warfarin, anticoagulation can be resumed on 02/21/21

## 2021-02-22 ENCOUNTER — Encounter (HOSPITAL_COMMUNITY): Payer: Self-pay | Admitting: Neurosurgery

## 2021-02-22 ENCOUNTER — Telehealth: Payer: Self-pay | Admitting: Neurology

## 2021-02-22 DIAGNOSIS — Z7901 Long term (current) use of anticoagulants: Secondary | ICD-10-CM | POA: Diagnosis not present

## 2021-02-22 NOTE — Telephone Encounter (Signed)
The baclofen pump was reduced to 849.4 micrograms per day, a 9% decrease.

## 2021-02-22 NOTE — Telephone Encounter (Signed)
The patient called on 21 February 2021, she had her baclofen pump replaced 3 days prior, they use the same settings, and now she is feeling drowsy, she denies any issues with her ability to breathe.  We will need to reduce the rate on the pump, she denies taking any oral baclofen at this point.  She will come in today after 4 PM, I will readjust the dose on the pump.

## 2021-02-23 ENCOUNTER — Encounter: Payer: Self-pay | Admitting: Internal Medicine

## 2021-02-23 ENCOUNTER — Telehealth (INDEPENDENT_AMBULATORY_CARE_PROVIDER_SITE_OTHER): Payer: Medicare Other | Admitting: Internal Medicine

## 2021-02-23 VITALS — BP 120/70 | HR 74 | Temp 98.9°F | Wt 197.0 lb

## 2021-02-23 DIAGNOSIS — N390 Urinary tract infection, site not specified: Secondary | ICD-10-CM | POA: Diagnosis not present

## 2021-02-23 DIAGNOSIS — B379 Candidiasis, unspecified: Secondary | ICD-10-CM | POA: Diagnosis not present

## 2021-02-23 DIAGNOSIS — G35 Multiple sclerosis: Secondary | ICD-10-CM | POA: Diagnosis not present

## 2021-02-23 DIAGNOSIS — G8929 Other chronic pain: Secondary | ICD-10-CM | POA: Diagnosis not present

## 2021-02-23 DIAGNOSIS — I4821 Permanent atrial fibrillation: Secondary | ICD-10-CM | POA: Diagnosis not present

## 2021-02-23 DIAGNOSIS — E785 Hyperlipidemia, unspecified: Secondary | ICD-10-CM | POA: Diagnosis not present

## 2021-02-23 DIAGNOSIS — G4733 Obstructive sleep apnea (adult) (pediatric): Secondary | ICD-10-CM | POA: Diagnosis not present

## 2021-02-23 DIAGNOSIS — I5032 Chronic diastolic (congestive) heart failure: Secondary | ICD-10-CM

## 2021-02-23 DIAGNOSIS — B372 Candidiasis of skin and nail: Secondary | ICD-10-CM | POA: Diagnosis not present

## 2021-02-23 DIAGNOSIS — G4700 Insomnia, unspecified: Secondary | ICD-10-CM | POA: Diagnosis not present

## 2021-02-23 NOTE — Patient Instructions (Signed)
Medication Instructions:  NO CHANGES  *If you need a refill on your cardiac medications before your next appointment, please call your pharmacy*   Follow-Up: At [facility], you and your health needs are our priority.  As part of our continuing mission to provide you with exceptional heart care, we have created designated Provider Care Teams.  These Care Teams include your primary Cardiologist (physician) and Advanced Practice Providers (APPs -  Physician Assistants and Nurse Practitioners) who all work together to provide you with the care you need, when you need it.  We recommend signing up for the patient portal called "MyChart".  Sign up information is provided on this After Visit Summary.  MyChart is used to connect with patients for Virtual Visits (Telemedicine).  Patients are able to view lab/test results, encounter notes, upcoming appointments, etc.  Non-urgent messages can be sent to your provider as well.   To learn more about what you can do with MyChart, go to [email or web address].    Your next appointment:   6 month(s)  The format for your next appointment:   In Person  Provider:   K. Mali Hilty, MD  ** call in October to arrange a visit for Jan 2023  Other Instructions

## 2021-02-23 NOTE — Progress Notes (Signed)
Virtual Visit via Video Note   This visit type was conducted due to national recommendations for restrictions regarding the COVID-19 Pandemic (e.g. social distancing) in an effort to limit this patient's exposure and mitigate transmission in our community.  Due to her co-morbid illnesses, this patient is at least at moderate risk for complications without adequate follow up.  This format is felt to be most appropriate for this patient at this time.  The patient does have access to video technology.  All issues noted in this document were discussed and addressed.  A limited exam could be performed with this format.  Please refer to the patient's chart for her  consent to telehealth for [facility].   Evaluation Performed:  Video visit  Date:  02/23/2021   ID:  Toni [name], DOB 08/24/1945, MRN [id number]  Patient Location:  [place] [id number]  Provider location:   [address] [place], [place] [address]  PCP:  Prince Solian, MD  Cardiologist:  Pixie Casino, MD Electrophysiologist:  None   Chief Complaint:  Follow-up  History of Present Illness:    Toni [name] is a 75 y.o. female who presents via audio/video conferencing for a telehealth visit today.  Toni [name] is a 75 year old female with a history of longstanding persistent and not permanent A. fib and multiple sclerosis.  Recently she was hospitalized has had issues with orthostatic hypotension.  Medicines were discontinued and she was started on midodrine.  She was tested recently for adrenal insufficiency with a cosyntropin stim test which was negative.  She reports some mild improvement in blood pressures.  Her diuretics were held and she was placed on salt load and this resulted in a diastolic heart failure exacerbation.  Yesterday she was instructed to stop her salt and restarted on diuretics and is now diuresing.  10/24/2019  Toni [name] is seen today for follow-up.  She was recently  hospitalized at [facility] for possible pneumonia versus heart failure and treated for presumed sepsis.  As noted above she had COVID-19 back in December and has struggled with orthostatic hypotension requiring midodrine.  She had a protracted hospital course and was seen in consultation by Dr. Marlou Porch to agreed with the use of midodrine.  In follow-up she is required numerous adjustments on her anticoagulation as well as blood pressure medications.  Primarily, her midodrine has been weaned down due to some hypertension although the other day she was hypotensive and did have to take additional medication.  02/23/2021    The patient does not have symptoms concerning for COVID-19 infection (fever, chills, cough, or new SHORTNESS OF BREATH).    Prior CV studies:   The following studies were reviewed today:  Chart reviewed  PMHx:  Past Medical History:  Diagnosis Date   Abnormality of gait 03/01/2015   Anxiety    Arthritis    "knees" (01/19/2016)   Asthma    as a child    Back pain    Bilateral carpal tunnel syndrome 10/04/2020   Cancer ([place])    / RENAL CELL CARCINOMA - GETS CT SCAN EVERY 6 MONTHS TO WATCH    CHF (congestive heart failure) (HCC)    Chronic pain    "nerve pain from the MS"   Complex atypical endometrial hyperplasia    Constipation    Depression    DVT (deep venous thrombosis) ([place]) 1983   "RLE; may have just been phlebitis; it was before the age of dopplers"  Dysrhythmia    afib    Edema    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' ablation of R GSV 2012   GERD (gastroesophageal reflux disease)    HLD (hyperlipidemia)    Hypertension    Hypotension    Hypothyroidism    Joint pain    Multiple sclerosis ([place])    has had this may 1989-[facility] reg doc   OSA on CPAP    bipap machine - SLEEP APNEA WORSE SINCE COVID IN 1/21 PER PATIENT SETTING HAS INCREASED FROM 9 TO 30    Osteoarthritis    PAF (paroxysmal atrial fibrillation) ([place])    on coumadin;  documented on monitor 06/2010 - no longer takes per Kym Groom 02/09/21   Pneumonia 11/2011   Sleep apnea    uses sleep apnea machine    Past Surgical History:  Procedure Laterality Date   ANTERIOR CERVICAL DECOMP/DISCECTOMY FUSION  01/2004   CARDIAC CATHETERIZATION  06/22/2010   normal coronary arteries, PAF   CATARACT EXTRACTION, BILATERAL Bilateral    CHILD BIRTHS     X2   DILATATION & CURETTAGE/HYSTEROSCOPY WITH MYOSURE N/A 01/15/2020   Procedure: DILATATION & CURETTAGE/HYSTEROSCOPY WITH MYOSURE;  Surgeon: Arvella Nigh, MD;  Location: [facility];  Service: Gynecology;  Laterality: N/A;  pt in wheelchair-has MS   DILATION AND CURETTAGE OF UTERUS     DILATION AND CURETTAGE OF UTERUS N/A 02/16/2020   Procedure: DILATATION AND CURETTAGE;  Surgeon: Lafonda Mosses, MD;  Location: [facility];  Service: Gynecology;  Laterality: N/A;  NOT GENERAL -NO INTUBATION   INFUSION PUMP IMPLANTATION  ~ 2009   baclofen infusion in lower abd   INTRATHECAL PUMP REVISION N/A 02/18/2021   Procedure: Baclofen Pump replacement with possible revision of catheter;  Surgeon: Erline Levine, MD;  Location: [place];  Service: Neurosurgery;  Laterality: N/A;  3C/RM 20   INTRAUTERINE DEVICE (IUD) INSERTION N/A 02/16/2020   Procedure: INTRAUTERINE DEVICE (IUD) INSERTION - MIRENA;  Surgeon: Lafonda Mosses, MD;  Location: [facility];  Service: Gynecology;  Laterality: N/A;   PAIN PUMP REVISION N/A 07/29/2014   Procedure: Baclofen pump replacement;  Surgeon: Erline Levine, MD;  Location: [facility];  Service: Neurosurgery;  Laterality: N/A;  Baclofen pump replacement   TONSILLECTOMY AND ADENOIDECTOMY     VARICOSE VEIN SURGERY  ~ 1968   VENOUS ABLATION  12/16/2010   radiofreq ablation -Dr Elisabeth Cara and Emilio Baylock   WISDOM TOOTH EXTRACTION      FAMHx:  Family History  Problem Relation Age of Onset   Coronary artery disease Father        at age 42   Hyperlipidemia Father    Thyroid disease Father    Coronary artery disease  Maternal Grandmother    Depression Maternal Grandmother    Cancer Paternal Grandmother        breast   Depression Mother    Sudden death Mother    Anxiety disorder Mother    Alcoholism Son     SOCHx:   reports that she quit smoking about 44 years ago. Her smoking use included cigarettes. She has a 3.50 pack-year smoking history. She has never used smokeless tobacco. She reports previous alcohol use. She reports that she does not use drugs.  ALLERGIES:  Allergies  Allergen Reactions   Other Rash    Topical Surgical prep  - severe rash   Chlorhexidine Hives and Other (See Comments)    And, sores appear where applied  Hydrocodone Nausea And Vomiting and Other (See Comments)    Hydrocodone causes vomiting Hydrocodone causes vomiting   Oxycodone Nausea And Vomiting and Other (See Comments)    Oral oxycodone causes vomiting   Sertraline Hives, Swelling, Rash and Other (See Comments)    [DERM][OTHER]RASH WITH SWELLING    MEDS:  Current Meds  Medication Sig   acetaminophen (TYLENOL) 500 MG tablet Take 1,000 mg by mouth every 6 (six) hours as needed (fever/headache/pain).   ALPRAZolam (XANAX) 0.5 MG tablet TAKE 1 TABLET BY MOUTH 3  TIMES DAILY AS NEEDED FOR  ANXIETY AND SLEEP (Patient taking differently: Take 0.5 mg by mouth 3 (three) times daily as needed for anxiety or sleep.)   baclofen (LIORESAL) 20 MG tablet Take 1 tablet (20 mg total) by mouth 4 (four) times daily as needed for muscle spasms. (Patient taking differently: Take 20 mg by mouth in the morning, at noon, in the evening, and at bedtime. Hold for lethargy)   Biotin 10000 MCG TABS Take 10,000 mcg by mouth daily after lunch.   buPROPion (WELLBUTRIN XL) 150 MG 24 hr tablet Take 1 tablet (150 mg total) by mouth daily. (Patient taking differently: Take 150 mg by mouth daily after lunch.)   Calcium Carbonate-Vitamin D3 600-400 MG-UNIT TABS Take 1 tablet by mouth in the morning and at bedtime. (After breakfast & after lunch)    cetirizine (ZYRTEC) 10 MG tablet Take 10 mg by mouth daily as needed for allergies.    Cholecalciferol (VITAMIN D3) 50 MCG (2000 UT) TABS Take 2,000 Units by mouth in the morning and at bedtime. (After breakfast & after lunch)   Coenzyme Q10 (COQ10 PO) Take by mouth daily in the afternoon.   diclofenac Sodium (VOLTAREN) 1 % GEL Apply 2 g topically 3 (three) times daily as needed. (Patient taking differently: Apply 2 g topically 3 (three) times daily as needed (pain (applied to bilateral shoulders)).)   diltiazem (DILACOR XR) 240 MG 24 hr capsule Take 240 mg by mouth daily after breakfast.   docusate sodium (COLACE) 100 MG capsule Take 1 capsule (100 mg total) by mouth daily. (Patient taking differently: Take 100 mg by mouth at bedtime.)   escitalopram (LEXAPRO) 10 MG tablet Take 1 tablet (10 mg total) by mouth daily. (Patient taking differently: Take 10 mg by mouth at bedtime.)   famotidine (PEPCID) 20 MG tablet Take 20 mg by mouth at bedtime.    gabapentin (NEURONTIN) 300 MG capsule TAKE 1 CAPSULE BY MOUTH UP  TO 4 TIMES DAILY (Patient taking differently: Take 300 mg by mouth in the morning, at noon, in the evening, and at bedtime. (0500, 1100, 1700 & 2300))   lactobacillus acidophilus (BACID) TABS tablet Take 1 tablet by mouth daily as needed.   lactulose (CHRONULAC) 10 GM/15ML solution Take 10 g by mouth 2 (two) times daily as needed (constipation).   levothyroxine (SYNTHROID, LEVOTHROID) 75 MCG tablet Take 75 mcg by mouth daily before breakfast. (0500)   linaclotide (LINZESS) 145 MCG CAPS capsule Take 1 capsule (145 mcg total) by mouth daily before breakfast. (Patient taking differently: Take 145 mcg by mouth daily before breakfast. (0500))   melatonin 5 MG TABS Take 5 mg by mouth at bedtime.   Menthol-Zinc Oxide (REMEDY CALAZIME) 0.4-20.5 % PSTE Apply 1 application topically 3 (three) times daily as needed (buttocks/sacrum with incontinences care episodes).   Multiple Vitamin (MULTIVITAMIN WITH  MINERALS) TABS tablet Take 1 tablet by mouth daily after lunch.   neomycin-bacitracin-polymyxin (NEOSPORIN) ointment Apply 1  application topically 2 (two) times daily as needed for wound care (applied to nose (wear mask rubs)).   NON FORMULARY ASV breathing machine is to be used while napping & at bedtime. Fill canister up to max line with distilled water and turn concentrator to 5LPM.   omeprazole (PRILOSEC) 20 MG capsule Take 20 mg by mouth daily before breakfast. (0500)   ondansetron (ZOFRAN) 8 MG tablet Take 8 mg by mouth daily as needed for nausea or vomiting.   oxybutynin (DITROPAN) 5 MG tablet Take 5 mg by mouth 3 (three) times daily as needed for bladder spasms.    Propylene Glycol-Glycerin (ARTIFICIAL TEARS) 1-0.3 % SOLN Place 2 drops into both eyes daily as needed (dry eyes.).   psyllium (METAMUCIL SMOOTH TEXTURE) 28 % packet Take 1 packet by mouth at bedtime.   simvastatin (ZOCOR) 10 MG tablet Take 10 mg by mouth at bedtime.   SYRINGE/NEEDLE, DISP, 1 ML (BD LUER-LOK SYRINGE) 25G X 5/8" 1 ML MISC Use as directed to inject  Acthar   traMADol (ULTRAM) 50 MG tablet Take 1 tablet (50 mg total) by mouth every 6 (six) hours as needed for moderate pain.   warfarin (COUMADIN) 5 MG tablet TAKE 1/2 TO 1 TABLET BY  MOUTH DAILY AS DIRECTED BY  COUMADIN CLINIC   zinc oxide 20 % ointment Apply 1 application topically as needed for irritation (apply labia every shift as needed for redness).     ROS: Pertinent items noted in HPI and remainder of comprehensive ROS otherwise negative.  Labs/Other Tests and Data Reviewed:    Recent Labs: 11/03/2020: B Natriuretic Peptide 154.5 01/03/2021: TSH 2.961 01/12/2021: ALT 15; Magnesium 2.2 02/18/2021: BUN 19; Creatinine, Ser 0.54; Hemoglobin 14.3; Platelets 176; Potassium 3.9; Sodium 136   Recent Lipid Panel Lab Results  Component Value Date/Time   CHOL 111 03/19/2019 03:17 PM   TRIG 53 03/19/2019 03:17 PM   HDL 57 03/19/2019 03:17 PM   CHOLHDL 3.0  04/22/2010 05:36 AM   LDLCALC 43 03/19/2019 03:17 PM    Wt Readings from Last 3 Encounters:  02/23/21 197 lb (89.4 kg)  02/18/21 199 lb 15.3 oz (90.7 kg)  01/03/21 182 lb 15.7 oz (83 kg)     Exam:    Vital Signs:  BP 120/70   Pulse 74   Temp 98.9 F (37.2 C)   Wt 197 lb (89.4 kg)   BMI 36.03 kg/m    General appearance: alert, no distress, and moderately obese Lungs: No audible respiratory difficulty Abdomen: obese Extremities: not visualized Skin: pale Neurologic: Mental status: Alert, oriented, thought content appropriate   ASSESSMENT & PLAN:    Longstanding persistent and not permanent A. fib Orthostatic hypotension Multiple sclerosis Chronic diastolic heart failure  Toni [name] was recently hospitalized for issues with her baclofen pump which had to be replaced.  She is now at [facility] for rehab.  She is requesting a more liberalized diet and we will change her from cardiac to salt restricted.  Her cholesterol is actually well managed with total 123, triglycerides 61, HDL 53 and LDL 58.  She is on low-dose simvastatin.  She has had significant weight loss but marked reduction in her appetite.  She is currently on Lasix 40 mg twice daily and I would continue that.  I be leery about increasing it further although she says she still has some swelling.  She will need to monitor sodium.  Plan repeat follow-up in about 6 months  COVID-19 Education: The signs and  symptoms of COVID-19 were discussed with the patient and how to seek care for testing (follow up with PCP or arrange E-visit).  The importance of social distancing was discussed today.  Patient Risk:   After full review of this patients clinical status, I feel that they are at least moderate risk at this time.  Time:   Today, I have spent 25 minutes with the patient with telehealth technology discussing A. fib, orthostatic hypotension..     Medication Adjustments/Labs and Tests Ordered: Current medicines are  reviewed at length with the patient today.  Concerns regarding medicines are outlined above.   Tests Ordered: No orders of the defined types were placed in this encounter.   Medication Changes: No orders of the defined types were placed in this encounter.   Disposition:  in 6 month(s)  Pixie Casino, MD, [facility], [place] Director of the Advanced Lipid Disorders &  Cardiovascular Risk Reduction Clinic Diplomate of the American Board of Clinical Lipidology Attending Cardiologist  Direct Dial: [phone]  Fax: [phone]  Website:  [email or web address]  Pixie Casino, MD  02/23/2021 4:44 PM

## 2021-02-28 ENCOUNTER — Other Ambulatory Visit: Payer: Self-pay | Admitting: Gastroenterology

## 2021-02-28 ENCOUNTER — Other Ambulatory Visit: Payer: Self-pay | Admitting: Internal Medicine

## 2021-03-01 NOTE — Telephone Encounter (Signed)
Has not been seen since 2020 and is requesting year supply. OV required

## 2021-03-02 DIAGNOSIS — K5900 Constipation, unspecified: Secondary | ICD-10-CM | POA: Diagnosis not present

## 2021-03-02 DIAGNOSIS — L0390 Cellulitis, unspecified: Secondary | ICD-10-CM | POA: Diagnosis not present

## 2021-03-02 DIAGNOSIS — N390 Urinary tract infection, site not specified: Secondary | ICD-10-CM | POA: Diagnosis not present

## 2021-03-02 DIAGNOSIS — B379 Candidiasis, unspecified: Secondary | ICD-10-CM | POA: Diagnosis not present

## 2021-03-02 DIAGNOSIS — E039 Hypothyroidism, unspecified: Secondary | ICD-10-CM | POA: Diagnosis not present

## 2021-03-02 DIAGNOSIS — G8929 Other chronic pain: Secondary | ICD-10-CM | POA: Diagnosis not present

## 2021-03-02 DIAGNOSIS — E785 Hyperlipidemia, unspecified: Secondary | ICD-10-CM | POA: Diagnosis not present

## 2021-03-02 DIAGNOSIS — G4700 Insomnia, unspecified: Secondary | ICD-10-CM | POA: Diagnosis not present

## 2021-03-02 DIAGNOSIS — N3289 Other specified disorders of bladder: Secondary | ICD-10-CM | POA: Diagnosis not present

## 2021-03-02 DIAGNOSIS — R21 Rash and other nonspecific skin eruption: Secondary | ICD-10-CM | POA: Diagnosis not present

## 2021-03-02 DIAGNOSIS — G35 Multiple sclerosis: Secondary | ICD-10-CM | POA: Diagnosis not present

## 2021-03-02 DIAGNOSIS — B372 Candidiasis of skin and nail: Secondary | ICD-10-CM | POA: Diagnosis not present

## 2021-03-03 ENCOUNTER — Telehealth: Payer: Self-pay | Admitting: Neurology

## 2021-03-03 ENCOUNTER — Encounter: Payer: Self-pay | Admitting: *Deleted

## 2021-03-03 NOTE — Telephone Encounter (Signed)
Called patient and informed her she can't see two providers on the same day per billing manager. Asked her what needs to be done with baclofen pump. She stated Dr [name] reduced it 9% but, she thinks baclofen pump needs to be reduced again because she's still having some drowsiness.  I informed her he is in office next week, will send this message to him. Patient verbalized understanding, appreciation.

## 2021-03-03 NOTE — Telephone Encounter (Signed)
Pt would like to know if she can have Dr. [name] change something on her Baclofen Pump while she is seeing Dr. Brett Fairy. Please advise.

## 2021-03-07 ENCOUNTER — Ambulatory Visit (INDEPENDENT_AMBULATORY_CARE_PROVIDER_SITE_OTHER): Payer: Medicare Other | Admitting: Neurology

## 2021-03-07 ENCOUNTER — Encounter: Payer: Self-pay | Admitting: Neurology

## 2021-03-07 VITALS — BP 102/50 | HR 66 | Ht 62.0 in | Wt 204.2 lb

## 2021-03-07 DIAGNOSIS — U099 Post covid-19 condition, unspecified: Secondary | ICD-10-CM | POA: Diagnosis not present

## 2021-03-07 DIAGNOSIS — E240 Pituitary-dependent Cushing's disease: Secondary | ICD-10-CM | POA: Diagnosis not present

## 2021-03-07 DIAGNOSIS — R0609 Other forms of dyspnea: Secondary | ICD-10-CM | POA: Diagnosis not present

## 2021-03-07 DIAGNOSIS — M62838 Other muscle spasm: Secondary | ICD-10-CM

## 2021-03-07 DIAGNOSIS — I5032 Chronic diastolic (congestive) heart failure: Secondary | ICD-10-CM | POA: Diagnosis not present

## 2021-03-07 DIAGNOSIS — G35 Multiple sclerosis: Secondary | ICD-10-CM | POA: Diagnosis not present

## 2021-03-07 MED ORDER — GABAPENTIN 300 MG PO CAPS: ORAL_CAPSULE | ORAL | 11 refills | Status: AC

## 2021-03-07 NOTE — Telephone Encounter (Signed)
Per Dr [name], Called patient and offered her 4:30 pm appointment on Wed or Thurs. She stated she can't come Wed, will see if she can get transpo for Thurs. She will call back,  verbalized understanding, appreciation.

## 2021-03-07 NOTE — Telephone Encounter (Signed)
Patient coming Thurs afternoon.

## 2021-03-07 NOTE — Progress Notes (Signed)
PATIENT: Toni [name] [name] DOB: 02-10-1946  REASON FOR VISIT: follow up HISTORY FROM: patient  HISTORY OF PRESENT ILLNESS: 03/07/21:  Toni [name] [name] is a 75 year old female with a history of obstructive sleep apnea on BiPAP and multiple sclerosis. 03-07-2021.   She was seen on 03-07-2021. She has been hospitalized many times for UTI, has been started on oxygen and on ASV- had a new baclofen pump- implanted on July 1.2022. Dr [name] has reduced the baclofen dose and will try to reduce further.  Had a flushing done in hospital- cathether was open. The patient started on out to servo ventilation her compliance is excellent 100% of the last 30 days 29 of those days was over 4 hours with an average of 9 hours 46 minutes.  Residual AHI was 6.5 of which only 1/h is an apnea and 5.5 hypopneas or shallow breathing spells.  There was a minimum leak and median was 3.7 L/min so based on this and knowing that she has additional oxygen I think it will make a difference in her restorative quality of her sleep.  She is still daytime sleepy which is partially due to all other medical conditions and also due to medication especially the higher doses of Neurontin and baclofen I think that reducing her baclofen pump a little further may help to restore some alertness and I am happy to look if we can change her gabapentin doses from lowered doses through the day to a higher dose at night.  The Epworth sleepiness score was still endorsed at 18 out of 24 points today.  Medication is widely unchanged except for the change in the new baclofen pump settings.  The patient currently takes gabapentin 3 mg at 1 capsule  up to 4 throughout the day. Every 6 hours, but medication wears off at 4 hours- will increase frequency and not dose per time.       She returns today for follow-up on 09-13-2020. We are discussing her high AHi on BiPAP, also she is very compliant with aditional 3 liters of oxygen, she will have to re qualify for  oxygen , I ned her to come in for in lab study , but she is n need of a medical bed. All this has beeen hampering her progress.  Since she contracted COVID in a rehab facility she has lost weight, has SOB more often, and she is not doing well with the PAP mask. She feels she needs more ACTHAR shots.  These halp her still.  She has had a baclofen pump , but there was enough baclofen in the pump, and she remains in pain.  She has fluid retention- Dr Debara Pickett, CHF related anasarca. belly swelling and spasms in abdomen.  She is on diuretics and potassium.  She has no bladder retention, and her CUREwick is helpful.  OT wants a new NCV. She had thenar atphroy - left hand. Dr [name] stated this is Carpal tunnel- her hand surgeon wants documentation.    Multiple sclerosis: She continues on Acthar.   She continues to have weakness in the right lower extremity.  She states that she has a hard time pivoting when ambulating.  She is in a wheelchair today.  She does feel that her memory has changed over time.  She states that if she does not make a list she typically does not remember.  She has a caregiver as well as her husband that helps her.  Obstructive sleep apnea on BiPAP: Download indicates that she  use her machine nightly for compliance of 100%.  She use her machine greater than 4 hours each night.  On average she uses it 8 hours and 50 minutes.  Her residual AHI is 21.5 on 16/11 centimeters of water.  She reports that she had an overnight pulse oximetry test with her pulmonologist that showed desaturations despite being on 3 L of oxygen at night.  She feels that her BiPAP pressure is too low.  She returns today for an evaluation.  [name] [name] who prefers to be called Toni [name] [name], is a 75 year old Caucasian female with a longstanding history of multiple sclerosis.  Her primary care physician is Dr. Prince Solian, MD PhD, her cardiologist is Dr. Debara Pickett and her new pulmonologist is Dr. Ander Slade.  The  patient entered the secondary progressive stages of MS several years ago, she has a baclofen pump to deal with the spasticity remaining she had further progression after she contracted Covid, she still feels that she has not recovered her baseline which is very likely.  The patient has atrial fibrillation for which she is followed by Dr. Debara Pickett, steroid infusions that be used in the past to limit an MS exacerbation had also contrast contributed to atrophic in onset.  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  BiPAP was no longer working and especially did not prevent hypoxemia at night.  She is using BiPAP present but it has produced an enormous oral airleak and she cannot tolerate a full facemask, he finally were able to convince her pulmonologist that she needs oxygen at 3 L either bled into this BiPAP device or to be used by nasal cannula .   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  Oncology gynecology did not find other malignant cells outside of the affected polyp.  She is now wearing an IUD that has progesterone as a Depo.   Her baclofen pump will be refilled later this month by Dr. [name].  She has been taking ACTH to prevent relapses which has been kinder to her heart rate.  The baclofen pump seems to be malfunctioning and she is addressing this with Dr. [name].   She also developed constant bladder leaking and incontinence issues she has had a Foley catheter placed unfortunately there were 250 cc of residual urine that she could not spontaneously void.  With her Foley catheter however she contracted a UTI and she is already on the third round of Macrobid.   She was diagnosed with renal cell carcinoma 5 years ago which has been status quo and is followed yearly by urology.   Vital signs today were given by her caregiver and daughter  [name].  Blood pressure was 112/66 mmHg, heart rate was 74 and regular.  Her body temperature was 98.6 F, oxygen saturation was 97%.    REVIEW OF SYSTEMS: Out of a complete 14 system review of symptoms, the patient complains only of the following symptoms, and all other reviewed systems are negative.  See HPI  ALLERGIES: Allergies  Allergen Reactions   Other Rash    Topical Surgical prep  - severe rash   Chlorhexidine Hives and Other (See Comments)    And, sores appear where applied   Hydrocodone Nausea And Vomiting and Other (See Comments)    Hydrocodone causes vomiting Hydrocodone causes vomiting   Oxycodone  Nausea And Vomiting and Other (See Comments)    Oral oxycodone causes vomiting   Sertraline Hives, Swelling, Rash and Other (See Comments)    [DERM][OTHER]RASH WITH SWELLING    HOME MEDICATIONS: Outpatient Medications Prior to Visit  Medication Sig Dispense Refill   acetaminophen (TYLENOL) 500 MG tablet Take 1,000 mg by mouth every 6 (six) hours as needed (fever/headache/pain).     ALPRAZolam (XANAX) 0.5 MG tablet TAKE 1 TABLET BY MOUTH 3  TIMES DAILY AS NEEDED FOR  ANXIETY AND SLEEP (Patient taking differently: Take 0.5 mg by mouth 3 (three) times daily as needed for anxiety or sleep.) 90 tablet 1   baclofen (LIORESAL) 20 MG tablet Take 1 tablet (20 mg total) by mouth 4 (four) times daily as needed for muscle spasms. (Patient taking differently: Take 20 mg by mouth in the morning, at noon, in the evening, and at bedtime. Hold for lethargy) 30 each 0   Biotin 10000 MCG TABS Take 10,000 mcg by mouth daily after lunch.     buPROPion (WELLBUTRIN XL) 150 MG 24 hr tablet Take 1 tablet (150 mg total) by mouth daily. (Patient taking differently: Take 150 mg by mouth daily after lunch.) 90 tablet 1   Calcium Carbonate-Vitamin D3 600-400 MG-UNIT TABS Take 1 tablet by mouth in the morning and at bedtime. (After breakfast & after lunch)     cetirizine (ZYRTEC) 10 MG tablet Take 10 mg by  mouth daily as needed for allergies.      Cholecalciferol (VITAMIN D3) 50 MCG (2000 UT) TABS Take 2,000 Units by mouth in the morning and at bedtime. (After breakfast & after lunch)     Coenzyme Q10 (COQ10 PO) Take by mouth daily in the afternoon.     diclofenac Sodium (VOLTAREN) 1 % GEL Apply 2 g topically 3 (three) times daily as needed. (Patient taking differently: Apply 2 g topically 3 (three) times daily as needed (pain (applied to bilateral shoulders)).) 100 g 3   diltiazem (DILACOR XR) 240 MG 24 hr capsule Take 240 mg by mouth daily after breakfast.     docusate sodium (COLACE) 100 MG capsule Take 1 capsule (100 mg total) by mouth daily. (Patient taking differently: Take 100 mg by mouth at bedtime.) 10 capsule 0   escitalopram (LEXAPRO) 10 MG tablet Take 1 tablet (10 mg total) by mouth daily. (Patient taking differently: Take 10 mg by mouth at bedtime.) 90 tablet 1   famotidine (PEPCID) 20 MG tablet Take 20 mg by mouth at bedtime.   6   furosemide (LASIX) 20 MG tablet Take 2 tablets (40 mg total) by mouth in the morning, at noon, and at bedtime. (Patient taking differently: Take 40 mg by mouth 2 (two) times daily.) 30 tablet 0   gabapentin (NEURONTIN) 300 MG capsule TAKE 1 CAPSULE BY MOUTH UP  TO 4 TIMES DAILY (Patient taking differently: Take 300 mg by mouth in the morning, at noon, in the evening, and at bedtime. (0500, 1100, 1700 & 2300)) 360 capsule 3   lactobacillus acidophilus (BACID) TABS tablet Take 1 tablet by mouth daily as needed.     lactulose (CHRONULAC) 10 GM/15ML solution Take 10 g by mouth 2 (two) times daily as needed (constipation).     levothyroxine (SYNTHROID, LEVOTHROID) 75 MCG tablet Take 75 mcg by mouth daily before breakfast. (0500)     linaclotide (LINZESS) 145 MCG CAPS capsule Take 1 capsule (145 mcg total) by mouth daily before breakfast. (Patient taking differently: Take 145 mcg by mouth  daily before breakfast. (0500)) 90 capsule 3   melatonin 5 MG TABS Take 5 mg by  mouth at bedtime.     Menthol-Zinc Oxide (REMEDY CALAZIME) 0.4-20.5 % PSTE Apply 1 application topically 3 (three) times daily as needed (buttocks/sacrum with incontinences care episodes).     midodrine (PROAMATINE) 5 MG tablet Take 1 tablet (5 mg total) by mouth 3 (three) times daily with meals. (Patient taking differently: Take 5 mg by mouth as needed. For low BP) 270 tablet 3   Multiple Vitamin (MULTIVITAMIN WITH MINERALS) TABS tablet Take 1 tablet by mouth daily after lunch.     neomycin-bacitracin-polymyxin (NEOSPORIN) ointment Apply 1 application topically 2 (two) times daily as needed for wound care (applied to nose (wear mask rubs)).     NON FORMULARY ASV breathing machine is to be used while napping & at bedtime. Fill canister up to max line with distilled water and turn concentrator to 5LPM.     omeprazole (PRILOSEC) 20 MG capsule Take 20 mg by mouth daily before breakfast. (0500)     ondansetron (ZOFRAN) 8 MG tablet Take 8 mg by mouth daily as needed for nausea or vomiting.     oxybutynin (DITROPAN) 5 MG tablet Take 5 mg by mouth 3 (three) times daily as needed for bladder spasms.      potassium chloride SA (KLOR-CON) 20 MEQ tablet Take 2 tablets (40 mEq total) by mouth daily. (Patient taking differently: Take 20 mEq by mouth 2 (two) times daily. (After lunch & after dinner)) 180 tablet 3   Propylene Glycol-Glycerin (ARTIFICIAL TEARS) 1-0.3 % SOLN Place 2 drops into both eyes daily as needed (dry eyes.).     psyllium (METAMUCIL SMOOTH TEXTURE) 28 % packet Take 1 packet by mouth at bedtime.     simvastatin (ZOCOR) 10 MG tablet Take 10 mg by mouth at bedtime.     SYRINGE/NEEDLE, DISP, 1 ML (BD LUER-LOK SYRINGE) 25G X 5/8" 1 ML MISC Use as directed to inject  Acthar 10 each prn   traMADol (ULTRAM) 50 MG tablet Take 1 tablet (50 mg total) by mouth every 6 (six) hours as needed for moderate pain. 28 tablet 0   warfarin (COUMADIN) 5 MG tablet TAKE 1/2 TO 1 TABLET BY  MOUTH DAILY AS DIRECTED BY   COUMADIN CLINIC 90 tablet 3   zinc oxide 20 % ointment Apply 1 application topically as needed for irritation (apply labia every shift as needed for redness).     No facility-administered medications prior to visit.    PAST MEDICAL HISTORY: Past Medical History:  Diagnosis Date   Abnormality of gait 03/01/2015   Anxiety    Arthritis    "knees" (01/19/2016)   Asthma    as a child    Back pain    Bilateral carpal tunnel syndrome 10/04/2020   Cancer ([place])    / RENAL CELL CARCINOMA - GETS CT SCAN EVERY 6 MONTHS TO WATCH    CHF (congestive heart failure) (HCC)    Chronic pain    "nerve pain from the MS"   Complex atypical endometrial hyperplasia    Constipation    Depression    DVT (deep venous thrombosis) ([place]) 1983   "RLE; may have just been phlebitis; it was before the age of dopplers"   Dysrhythmia    afib    Edema    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' ablation of R GSV 2012   GERD (gastroesophageal reflux disease)  HLD (hyperlipidemia)    Hypertension    Hypotension    Hypothyroidism    Joint pain    Multiple sclerosis ([place])    has had this may 1989-[facility] reg doc   OSA on CPAP    bipap machine - SLEEP APNEA WORSE SINCE COVID IN 1/21 PER PATIENT SETTING HAS INCREASED FROM 9 TO 30    Osteoarthritis    PAF (paroxysmal atrial fibrillation) ([place])    on coumadin; documented on monitor 06/2010 - no longer takes per Kym Groom 02/09/21   Pneumonia 11/2011   Sleep apnea    uses sleep apnea machine    PAST SURGICAL HISTORY: Past Surgical History:  Procedure Laterality Date   ANTERIOR CERVICAL DECOMP/DISCECTOMY FUSION  01/2004   CARDIAC CATHETERIZATION  06/22/2010   normal coronary arteries, PAF   CATARACT EXTRACTION, BILATERAL Bilateral    CHILD BIRTHS     X2   DILATATION & CURETTAGE/HYSTEROSCOPY WITH MYOSURE N/A 01/15/2020   Procedure: DILATATION & CURETTAGE/HYSTEROSCOPY WITH MYOSURE;  Surgeon: Arvella Nigh, MD;  Location: [facility];  Service:  Gynecology;  Laterality: N/A;  pt in wheelchair-has MS   DILATION AND CURETTAGE OF UTERUS     DILATION AND CURETTAGE OF UTERUS N/A 02/16/2020   Procedure: DILATATION AND CURETTAGE;  Surgeon: Lafonda Mosses, MD;  Location: [facility];  Service: Gynecology;  Laterality: N/A;  NOT GENERAL -NO INTUBATION   INFUSION PUMP IMPLANTATION  ~ 2009   baclofen infusion in lower abd   INTRATHECAL PUMP REVISION N/A 02/18/2021   Procedure: Baclofen Pump replacement with possible revision of catheter;  Surgeon: Erline Levine, MD;  Location: [facility];  Service: Neurosurgery;  Laterality: N/A;  3C/RM 20   INTRAUTERINE DEVICE (IUD) INSERTION N/A 02/16/2020   Procedure: INTRAUTERINE DEVICE (IUD) INSERTION - MIRENA;  Surgeon: Lafonda Mosses, MD;  Location: [facility];  Service: Gynecology;  Laterality: N/A;   PAIN PUMP REVISION N/A 07/29/2014   Procedure: Baclofen pump replacement;  Surgeon: Erline Levine, MD;  Location: [facility];  Service: Neurosurgery;  Laterality: N/A;  Baclofen pump replacement   TONSILLECTOMY AND ADENOIDECTOMY     VARICOSE VEIN SURGERY  ~ 1968   VENOUS ABLATION  12/16/2010   radiofreq ablation -Dr Elisabeth Cara and [facility]   WISDOM TOOTH EXTRACTION      FAMILY HISTORY: Family History  Problem Relation Age of Onset   Coronary artery disease Father        at age 65   Hyperlipidemia Father    Thyroid disease Father    Coronary artery disease Maternal Grandmother    Depression Maternal Grandmother    Cancer Paternal Grandmother        breast   Depression Mother    Sudden death Mother    Anxiety disorder Mother    Alcoholism Son     SOCIAL HISTORY: Social History   Socioeconomic History   Marital status: Married    Spouse name: [name]   Number of children: 2   Years of education: COLLEGE   Highest education level: Not on file  Occupational History   Occupation: RETIRED  Tobacco Use   Smoking status: Former    Packs/day: 0.50    Years: 7.00    Pack years: 3.50    Types: Cigarettes     Quit date: 08/21/1976    Years since quitting: 44.5   Smokeless tobacco: Never  Vaping Use   Vaping Use: Never used  Substance and Sexual Activity   Alcohol use: Not Currently   Drug  use: No   Sexual activity: Not Currently    Birth control/protection: Post-menopausal  Other Topics Concern   Not on file  Social History Narrative   Lives in [place] with Husband [name] [phone], [phone]   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   Right handed   Drinks a little caffeine in coffee sometimes    Social Determinants of Health   Financial Resource Strain: Not on file  Food Insecurity: Not on file  Transportation Needs: Not on file  Physical Activity: Not on file  Stress: Not on file  Social Connections: Not on file  Intimate Partner Violence: Not on file      PHYSICAL EXAM  Vitals:   03/07/21 1540  BP: (!) 102/50  Pulse: 66  Weight: 204 lb 3.2 oz (92.6 kg)  Height: 5\' 2"  (1.575 m)   Body mass index is 37.35 kg/m.   No flowsheet data found. Montreal Cognitive Assessment  07/27/2020 10/04/2015 06/02/2015 03/30/2015  Visuospatial/ Executive (0/5) 5 4 5 5   Naming (0/3) 3 3 3 3   Attention: Read list of digits (0/2) 2 2 2 2   Attention: Read list of letters (0/1) 1 1 1 1   Attention: Serial 7 subtraction starting at 100 (0/3) 3 3 3 3   Language: Repeat phrase (0/2) 2 2 2 2   Language : Fluency (0/1) 1 1 1 1   Abstraction (0/2) 2 2 2 2   Delayed Recall (0/5) 4 5 5 5   Orientation (0/6) 6 6 6 6   Total 29 29 30 30   Adjusted Score (based on education) - 29 30 30       Lab Results  Component Value Date   WBC 5.9 02/18/2021   HGB 14.3 02/18/2021   HCT 42.7 02/18/2021   MCV 93.8 02/18/2021   PLT 176 02/18/2021      Component Value Date/Time   NA 136 02/18/2021 1047   NA 144 12/15/2020 1100   K 3.9 02/18/2021 1047   CL 95 (L) 02/18/2021 1047   CO2 33 (H) 02/18/2021 1047   GLUCOSE 87 02/18/2021 1047   BUN 19 02/18/2021 1047   BUN 27 12/15/2020 1100   CREATININE 0.54 02/18/2021 1047    CALCIUM 9.1 02/18/2021 1047   PROT 5.9 (L) 01/12/2021 0331   PROT 5.9 (L) 09/20/2020 1436   ALBUMIN 3.5 01/12/2021 0331   ALBUMIN 3.9 09/20/2020 1436   AST 17 01/12/2021 0331   ALT 15 01/12/2021 0331   ALKPHOS 56 01/12/2021 0331   BILITOT 0.4 01/12/2021 0331   BILITOT 0.3 09/20/2020 1436   GFRNONAA >60 02/18/2021 1047   GFRAA 90 09/20/2020 1436   Lab Results  Component Value Date   CHOL 111 03/19/2019   HDL 57 03/19/2019   LDLCALC 43 03/19/2019   TRIG 53 03/19/2019   CHOLHDL 3.0 04/22/2010   Lab Results  Component Value Date   HGBA1C 4.9 06/05/2020   No results found for: VITAMINB12 Lab Results  Component Value Date   TSH 2.961 01/03/2021      ASSESSMENT AND PLAN 75 y.o. year old female  has a past medical history of Abnormality of gait (03/01/2015), Anxiety, Arthritis, Asthma, Back pain, Bilateral carpal tunnel syndrome (10/04/2020), Cancer ([place]), CHF (congestive heart failure) ([place]), Chronic pain, Complex atypical endometrial hyperplasia, Constipation, Depression, DVT (deep venous thrombosis) ([facility]) (1983), Dysrhythmia, Edema, GERD (gastroesophageal reflux disease), HLD (hyperlipidemia), Hypertension, Hypotension, Hypothyroidism, Joint pain, Multiple sclerosis ([place]), OSA on CPAP, Osteoarthritis, PAF (paroxysmal atrial fibrillation) ([place]), Pneumonia (11/2011), and Sleep apnea. here with:  1.  Obstructive sleep  apnea on BiPAP  --Good compliance --Residual AHI is elevated at 21.5 events an hour --We will increase pressure to 17/12.  We will repeat a download in 2 months if no improvement will proceed with BiPAP titration  2.  Multiple sclerosis  --Continue Acthar --Continue monitoring symptoms   Advised if symptoms worsen or she develops new symptoms she should let us know we will follow-up in 2 to 3 months for repeat sleep download   I spent 30 minutes of face-to-face and non-face-to-face time with patient.  This included previsit chart review, lab review, study  review, order entry, electronic health record documentation, patient education. 03/07/2021, 4:14 PM [facility] [address], [place], [place] [address] [phone]     PATIENT: Toni [name] [name] DOB: Apr 18, 1946  REASON FOR VISIT: follow up MS, OSA, Spasms, / renal cell carcinoma - Dr. Ernst Bowler   HISTORY FROM: patient alone   HISTORY OF PRESENT ILLNESS:  Today 03/07/21, Rv: seeing Toni [name] [name] after last Baclofen pump refill and after sleep study:  The patient endorsed the Epworth Sleepiness Scale at 12/24 points   The patient's weight 207 pounds with a height of 63 (inches), resulting in a BMI of 36.7 kg/m2. The patient's neck circumference measured 18 inches.  CURRENT MEDICATIONS: Xanax, Lioresal, Zyrtec, Cardizem, Flonase, Lasix, Neurontin, Synthroid, Bystolic, Prilosec, Zantac, Zocor, Flomax, Coumadin   PATIENT'S NAME:  Toni [name] [name], Toni [name] [name] DOB:      June 26, 1946      MR#:    [id number]     DATE OF RECORDING: 08/12/2018 [facility] REFERRING M.D.:  Prince Solian MD Study Performed:  Split-Night Titration Study HISTORY:  Toni [name] [name] is a 75 year old female MS patient and with complex sleep apnea on BIPAP   She today having lost again a significant amount of weight.  She follows Dr. Leafy Ro at the medical weight management clinic.  With her weight loss came also the needs to refit her CPAP mask.  She is using a BiPAP setting of 20/15 cmH2O and her residual AHI is 4.9/h the best for long time.  She does have minimal leaks which speaks for the good fit of her current mask.  She uses the machine 100% of the time of 7 hours and 2 minutes of average use.  I would not like to change any settings and also the patient reports that she has pressure marks and sometimes almost bruising from her facial mask seems to be the best she has done in a long time.  Her Epworth sleepiness score also has improved /she only endorsed 12 out of 24 points and she endorsed 20 out of 63 points of the fatigue  severity score.  I see her as much less fatigued, and more optimistic.  Overall stronger -no longer with home health- now with "[facility]" 3 times a week. She now can stand long enough to pull up her pants. Her last labs were reviewed. She is in chronic atrial fibrillation- rate controlled on diltiazem 180 mg, BP is lower.    Patient returning for a BIPAP Titration sleep study scored at 4%, with a 1 hour Baseline study at the beginning.  POLYSOMNOGRAPHY IMPRESSION :   Complex Sleep Apnea (CSA) was not significantly improved by BiPAP .The oxygen level improved  Central Sleep Apnea   abnormal EKG- chronic a fib.  Fragmented sleep, with and without BiPAP.  RECOMMENDATIONS: Use of BIPAP is optional, it will not influence the cardiac arrhythmia once chronic. There was a marginal benefit with  the use of PAP, especially for the hypoxemia. No obstructive apnea of significance. She continues to use BipAP- she can lower the pressure.  Toni [name] [name], M.D.   08-05-2018  Medical Director, [facility]   Toni [name] [name] reports that her heart rate has been slower and her blood pressure has been measurably lower, while she remains in chronic atrial fibrillation.  She is on a calcium channel blocker, and a beta-blocker.  She has used her BiPAP at the current settings of 20/15 centimeters since the results of the last sleep study did not yet lead to a result.  Problems.  She is currently using a pressure that I ordered at 9/5 cmH2O.  I also asked for 30-day download on those results for sleep study as quoted above and she has been showing interest in using the BiPAP just because it does not help her fatigue to have her oxygen levels elevated.  It has had a very marginal effect on the overall AHI.  Her AHI was 5.6/h and would not require necessarily positive airway therapy.   She endorsed today the fatigue severity score at only 19 points which is unusually low for her and her Epworth Sleepiness Scale at 7  points which is also lower than usual.   She has lost so much weight that she no longer applies to the risk of obesity hypoventilation and I congratulated her for her extraordinary efforts given her limited exercise capacity.    History of present illness :Toni [name] [name]  has been seen in this office for MS for 30 years, was diagnosed by Dr. Rosiland Oz in [phone]. Toni [name] [name] had several days of severe spasms, feeling tight, and she was unable to transfer, to walk with her walker. At baseline she can walk 40-50 feet. She was asked to used acthar 2 days in a row, and it seems to help, she has 6 more to go. She did not feel any improvement after taking baclofen.  She has been successful in losing a lot of weight under Dr. Migdalia Dk medical weight management program, at that clinic she needs to stay and stand still on a special scale which allows the differentiation between total weight, water weight fluid and fat content.  She is not able right now to stand still enough to allow this machine to work.  However her weight loss has been so significant that she has some loose skin now up to 18 her current weight is 218 pounds and this will certainly support her apnea treatment.  Orders the patient is here followed also for complex and central sleep apnea, the control had been important as she also developed atrial fibrillation followed by Dr. Debara Pickett.   12-19-2017, RV . Follow up visit with Toni [name] [name], better known as Toni [name] [name], established 75 year old Caucasian female patient with long-standing history of MS.  She just celebrated her 62th wedding anniversary with her husband.  The patient has been followed for a prescription pump and fill by Dr. [name] she has an appointment on April 11 and at that time her baclofen rate was increased to 799.5 mcg/day, for a day if she felt floppy and weak.  The very next day the dose was reduced to 700.3 mcg a day and since then she has been doing okay.  She still uses actor, she still  follows with Dr. Leafy Ro at the medical weight management clinic.  Dr. Debara Pickett is her cardiologist, Dr. Dagmar Hait her primary care physician. The patient most recently saw Dr. Leafy Ro  she was unable to step up to the scale and told safely on.  Dr. Leafy Ro obtain a metabolic panel which showed hypokalemia we will review this in detail later on today's visit, she has been started on potassium supplements.   Date , MM: [name] is a 75 year old female with a history of multiple sclerosis and obstructive sleep apnea on BiPAP.   she returns today for follow-up.  Her BiPAP indicates that she use her machine 30 out of 30 days for compliance of 100%.  She use her machine greater than 4 hours 28 out of 30 days for compliance of 93%.  On average she uses her machine 7 hours and 14 minutes.  Her IPAP is 20 cm of water and EPAP pressure of 15 cm of water her residual AHI is 16.7.  She does not have a significant leak.  The patient reports that she has been seeing Dr. Leafy Ro for weight loss.  Reports that she is lost approximately 30 pounds.  She has an appointment this coming Wednesday.  The patient denies any new numbness or weakness does feel like her hands is getting gradually weaker.  She states that she does participate in physical therapy 1 time a week but then does her own exercises 3 times a week.  She uses a wheelchair primarily.  She reports that she has several more months of Betaseron left but once her prescription runs out she plans to stop this medication. She reports that taking Acthar works better for her. She reports that she has been on Copaxone and Avonex but never any of the oral medications. She returns today for an evaluation.  My long-standing patient Mariona Scholes has struggled trying to control her complex sleep apneas , which are central and obstructive in nature. She has started on a BiPAP setting of 20/15 cm water and has been 100% compliance for the last 30 days with an average of 7 hours and 42  minutes. The residual AHI is still 13.9 area advanced home care did call her and told her that there with 3 days consecutively during which her AHI was markedly reduced.This occurred during May, and it related to Toni [name] [name] being given at double dose of diuretics , while she was at [facility] rehab. It was most nights after a high diuretic was given that she slept the best and happily with congestive central apnea. In the meantime, her cardiologist, Dr. Debara Pickett, has increased Lasix from 60-80 mg daily but she has not had the same effect as she had at 100 mg daily. She is continuously in a fib now. No ablation as long as apnea is not treated.    Dr. Leafy Ro did an extensive lab panel,  Good HB a1c. She was low on protein- and she sees a nutritionist. [name] is now cooking for her. Lots of chicken and vegetables- and she enjoys the food.  MS stable on Acthar. No diabetes on acthar and no relapses into acute a fib. Melatonin helps sleep. Given that Mrs. Nuncio has suffered from [place] for almost 30 years now.     REVIEW OF SYSTEMS: Out of a complete 14 system review of symptoms, the patient complains only of the following symptoms, and all other reviewed systems are negative. Ankle and lag edema, Urinary urge. She uses a BiPAP - 20-15 cm water. She endorsed today the fatigue severity score at only 19 points which is unusually low for her and her Epworth Sleepiness Scale at 7 points which is  also lower than usual.   She has lost so much weight that she no longer applies to the risk of obesity hypoventilation and I congratulated her for her extraordinary efforts given her limited exercise capacity. Hemorrhoids, stool elimination is incomplete. Stool is soft. Abdominal pain,vagal feeling - gastroparesis? .    ALLERGIES: Allergies  Allergen Reactions   Other Rash    Topical Surgical prep  - severe rash   Chlorhexidine Hives and Other (See Comments)    And, sores appear where applied   Hydrocodone Nausea And Vomiting  and Other (See Comments)    Hydrocodone causes vomiting Hydrocodone causes vomiting   Oxycodone Nausea And Vomiting and Other (See Comments)    Oral oxycodone causes vomiting   Sertraline Hives, Swelling, Rash and Other (See Comments)    [DERM][OTHER]RASH WITH SWELLING    HOME MEDICATIONS: Outpatient Medications Prior to Visit  Medication Sig Dispense Refill   acetaminophen (TYLENOL) 500 MG tablet Take 1,000 mg by mouth every 6 (six) hours as needed (fever/headache/pain).     ALPRAZolam (XANAX) 0.5 MG tablet TAKE 1 TABLET BY MOUTH 3  TIMES DAILY AS NEEDED FOR  ANXIETY AND SLEEP (Patient taking differently: Take 0.5 mg by mouth 3 (three) times daily as needed for anxiety or sleep.) 90 tablet 1   baclofen (LIORESAL) 20 MG tablet Take 1 tablet (20 mg total) by mouth 4 (four) times daily as needed for muscle spasms. (Patient taking differently: Take 20 mg by mouth in the morning, at noon, in the evening, and at bedtime. Hold for lethargy) 30 each 0   Biotin 10000 MCG TABS Take 10,000 mcg by mouth daily after lunch.     buPROPion (WELLBUTRIN XL) 150 MG 24 hr tablet Take 1 tablet (150 mg total) by mouth daily. (Patient taking differently: Take 150 mg by mouth daily after lunch.) 90 tablet 1   Calcium Carbonate-Vitamin D3 600-400 MG-UNIT TABS Take 1 tablet by mouth in the morning and at bedtime. (After breakfast & after lunch)     cetirizine (ZYRTEC) 10 MG tablet Take 10 mg by mouth daily as needed for allergies.      Cholecalciferol (VITAMIN D3) 50 MCG (2000 UT) TABS Take 2,000 Units by mouth in the morning and at bedtime. (After breakfast & after lunch)     Coenzyme Q10 (COQ10 PO) Take by mouth daily in the afternoon.     diclofenac Sodium (VOLTAREN) 1 % GEL Apply 2 g topically 3 (three) times daily as needed. (Patient taking differently: Apply 2 g topically 3 (three) times daily as needed (pain (applied to bilateral shoulders)).) 100 g 3   diltiazem (DILACOR XR) 240 MG 24 hr capsule Take 240 mg by  mouth daily after breakfast.     docusate sodium (COLACE) 100 MG capsule Take 1 capsule (100 mg total) by mouth daily. (Patient taking differently: Take 100 mg by mouth at bedtime.) 10 capsule 0   escitalopram (LEXAPRO) 10 MG tablet Take 1 tablet (10 mg total) by mouth daily. (Patient taking differently: Take 10 mg by mouth at bedtime.) 90 tablet 1   famotidine (PEPCID) 20 MG tablet Take 20 mg by mouth at bedtime.   6   furosemide (LASIX) 20 MG tablet Take 2 tablets (40 mg total) by mouth in the morning, at noon, and at bedtime. (Patient taking differently: Take 40 mg by mouth 2 (two) times daily.) 30 tablet 0   gabapentin (NEURONTIN) 300 MG capsule TAKE 1 CAPSULE BY MOUTH UP  TO 4 TIMES DAILY (  Patient taking differently: Take 300 mg by mouth in the morning, at noon, in the evening, and at bedtime. (0500, 1100, 1700 & 2300)) 360 capsule 3   lactobacillus acidophilus (BACID) TABS tablet Take 1 tablet by mouth daily as needed.     lactulose (CHRONULAC) 10 GM/15ML solution Take 10 g by mouth 2 (two) times daily as needed (constipation).     levothyroxine (SYNTHROID, LEVOTHROID) 75 MCG tablet Take 75 mcg by mouth daily before breakfast. (0500)     linaclotide (LINZESS) 145 MCG CAPS capsule Take 1 capsule (145 mcg total) by mouth daily before breakfast. (Patient taking differently: Take 145 mcg by mouth daily before breakfast. (0500)) 90 capsule 3   melatonin 5 MG TABS Take 5 mg by mouth at bedtime.     Menthol-Zinc Oxide (REMEDY CALAZIME) 0.4-20.5 % PSTE Apply 1 application topically 3 (three) times daily as needed (buttocks/sacrum with incontinences care episodes).     midodrine (PROAMATINE) 5 MG tablet Take 1 tablet (5 mg total) by mouth 3 (three) times daily with meals. (Patient taking differently: Take 5 mg by mouth as needed. For low BP) 270 tablet 3   Multiple Vitamin (MULTIVITAMIN WITH MINERALS) TABS tablet Take 1 tablet by mouth daily after lunch.     neomycin-bacitracin-polymyxin (NEOSPORIN)  ointment Apply 1 application topically 2 (two) times daily as needed for wound care (applied to nose (wear mask rubs)).     NON FORMULARY ASV breathing machine is to be used while napping & at bedtime. Fill canister up to max line with distilled water and turn concentrator to 5LPM.     omeprazole (PRILOSEC) 20 MG capsule Take 20 mg by mouth daily before breakfast. (0500)     ondansetron (ZOFRAN) 8 MG tablet Take 8 mg by mouth daily as needed for nausea or vomiting.     oxybutynin (DITROPAN) 5 MG tablet Take 5 mg by mouth 3 (three) times daily as needed for bladder spasms.      potassium chloride SA (KLOR-CON) 20 MEQ tablet Take 2 tablets (40 mEq total) by mouth daily. (Patient taking differently: Take 20 mEq by mouth 2 (two) times daily. (After lunch & after dinner)) 180 tablet 3   Propylene Glycol-Glycerin (ARTIFICIAL TEARS) 1-0.3 % SOLN Place 2 drops into both eyes daily as needed (dry eyes.).     psyllium (METAMUCIL SMOOTH TEXTURE) 28 % packet Take 1 packet by mouth at bedtime.     simvastatin (ZOCOR) 10 MG tablet Take 10 mg by mouth at bedtime.     SYRINGE/NEEDLE, DISP, 1 ML (BD LUER-LOK SYRINGE) 25G X 5/8" 1 ML MISC Use as directed to inject  Acthar 10 each prn   traMADol (ULTRAM) 50 MG tablet Take 1 tablet (50 mg total) by mouth every 6 (six) hours as needed for moderate pain. 28 tablet 0   warfarin (COUMADIN) 5 MG tablet TAKE 1/2 TO 1 TABLET BY  MOUTH DAILY AS DIRECTED BY  COUMADIN CLINIC 90 tablet 3   zinc oxide 20 % ointment Apply 1 application topically as needed for irritation (apply labia every shift as needed for redness).     No facility-administered medications prior to visit.    PAST MEDICAL HISTORY: Past Medical History:  Diagnosis Date   Abnormality of gait 03/01/2015   Anxiety    Arthritis    "knees" (01/19/2016)   Asthma    as a child    Back pain    Bilateral carpal tunnel syndrome 10/04/2020   Cancer ([place])    /  RENAL CELL CARCINOMA - GETS CT SCAN EVERY 6 MONTHS TO  WATCH    CHF (congestive heart failure) (HCC)    Chronic pain    "nerve pain from the MS"   Complex atypical endometrial hyperplasia    Constipation    Depression    DVT (deep venous thrombosis) ([place]) 1983   "RLE; may have just been phlebitis; it was before the age of dopplers"   Dysrhythmia    afib    Edema    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' ablation of R GSV 2012   GERD (gastroesophageal reflux disease)    HLD (hyperlipidemia)    Hypertension    Hypotension    Hypothyroidism    Joint pain    Multiple sclerosis ([place])    has had this may 1989-[place] reg doc   OSA on CPAP    bipap machine - SLEEP APNEA WORSE SINCE COVID IN 1/21 PER PATIENT SETTING HAS INCREASED FROM 9 TO 30    Osteoarthritis    PAF (paroxysmal atrial fibrillation) ([place])    on coumadin; documented on monitor 06/2010 - no longer takes per [facility] 02/09/21   Pneumonia 11/2011   Sleep apnea    uses sleep apnea machine    PAST SURGICAL HISTORY: Past Surgical History:  Procedure Laterality Date   ANTERIOR CERVICAL DECOMP/DISCECTOMY FUSION  01/2004   CARDIAC CATHETERIZATION  06/22/2010   normal coronary arteries, PAF   CATARACT EXTRACTION, BILATERAL Bilateral    CHILD BIRTHS     X2   DILATATION & CURETTAGE/HYSTEROSCOPY WITH MYOSURE N/A 01/15/2020   Procedure: DILATATION & CURETTAGE/HYSTEROSCOPY WITH MYOSURE;  Surgeon: Arvella Nigh, MD;  Location: [facility];  Service: Gynecology;  Laterality: N/A;  pt in wheelchair-has MS   DILATION AND CURETTAGE OF UTERUS     DILATION AND CURETTAGE OF UTERUS N/A 02/16/2020   Procedure: DILATATION AND CURETTAGE;  Surgeon: Lafonda Mosses, MD;  Location: [facility];  Service: Gynecology;  Laterality: N/A;  NOT GENERAL -NO INTUBATION   INFUSION PUMP IMPLANTATION  ~ 2009   baclofen infusion in lower abd   INTRATHECAL PUMP REVISION N/A 02/18/2021   Procedure: Baclofen Pump replacement with possible revision of catheter;  Surgeon: Erline Levine, MD;  Location:  [facility];  Service: Neurosurgery;  Laterality: N/A;  3C/RM 20   INTRAUTERINE DEVICE (IUD) INSERTION N/A 02/16/2020   Procedure: INTRAUTERINE DEVICE (IUD) INSERTION - MIRENA;  Surgeon: Lafonda Mosses, MD;  Location: [facility];  Service: Gynecology;  Laterality: N/A;   PAIN PUMP REVISION N/A 07/29/2014   Procedure: Baclofen pump replacement;  Surgeon: Erline Levine, MD;  Location: [facility];  Service: Neurosurgery;  Laterality: N/A;  Baclofen pump replacement   TONSILLECTOMY AND ADENOIDECTOMY     VARICOSE VEIN SURGERY  ~ 1968   VENOUS ABLATION  12/16/2010   radiofreq ablation -Dr Elisabeth Cara and [facility]   WISDOM TOOTH EXTRACTION      FAMILY HISTORY: Family History  Problem Relation Age of Onset   Coronary artery disease Father        at age 79   Hyperlipidemia Father    Thyroid disease Father    Coronary artery disease Maternal Grandmother    Depression Maternal Grandmother    Cancer Paternal Grandmother        breast   Depression Mother    Sudden death Mother    Anxiety disorder Mother    Alcoholism Son     SOCIAL HISTORY: Social History  Socioeconomic History   Marital status: Married    Spouse name: [name]   Number of children: 2   Years of education: COLLEGE   Highest education level: Not on file  Occupational History   Occupation: RETIRED  Scientist, product/process development strain: Not on file   Food insecurity:    Worry: Not on file    Inability: Not on file   Transportation needs:    Medical: Not on file    Non-medical: Not on file  Tobacco Use   Smoking status: Former Smoker    Packs/day: 0.50    Years: 7.00    Pack years: 3.50    Types: Cigarettes    Last attempt to quit: 08/21/1976    Years since quitting: 42.0   Smokeless tobacco: Never Used  Substance and Sexual Activity   Alcohol use: Yes    Alcohol/week: 1.0 standard drinks    Types: 1 Glasses of wine per week   Drug use: No   Sexual activity: Not Currently  Lifestyle   Physical activity:    Days per  week: Not on file    Minutes per session: Not on file   Stress: Not on file  Relationships   Social connections:    Talks on phone: Not on file    Gets together: Not on file    Attends religious service: Not on file    Active member of club or organization: Not on file    Attends meetings of clubs or organizations: Not on file    Relationship status: Not on file   Intimate partner violence:    Fear of current or ex partner: Not on file    Emotionally abused: Not on file    Physically abused: Not on file    Forced sexual activity: Not on file  Other Topics Concern   Not on file  Social History Narrative   Lives in [place] with Husband [name] [phone], [phone]   Currently in PT - [facility] , [place] [address]     PHYSICAL EXAM  Vitals:   03/07/21 1540  BP: (!) 102/50  Pulse: 66  Weight: 204 lb 3.2 oz (92.6 kg)  Height: 5\' 2"  (1.575 m)   Body mass index is 37.35 kg/m.  Generalized:  in no acute distress, wheelchair    irregular heart beat, atrial  fibrillation.  Wheelchair bound but able to transfer. Excess skin after weight loss, chin and upper arms- bilateral edema up to the knees. Broken blood vessels.  Lungs are clear to auscultation.   Neurological examination :  Mentation: Alert oriented to time, place, history taking. Follows all commands speech and language fluent, good memory. She has noted word finding slowing, and slurring. Has to deliberately speak slow.  Cranial nerve : Pupils were equal round reactive to light.  Extraocular movements were full, visual field were full on confrontational test. Facial sensation and strength were normal. Tongue in midline, bo fasciculations. .  Head turning and shoulder shrug  were normal and symmetric. Motor: The patient cannot fully extend her right upper extremity at the elbow, but she has good biceps flexion strength. Grip strength is weak and bilaterally there is bilaterally significant atrophy of the thenar eminence  noted.  She has not just flattening of the eminence, but a groove,   SENSORY_ numbness, unable to do crossword puzzles and write letters. Her fingers feel stiff. She noted an abdominal and chest spasm, she is unable right now to walk has lower  back pain and feels generalized weak and heavy. After has given her some energy back.   As to sensory she noticed ongoing numbness in the fingers of both hands the pounds but no numbness in the legs.  Loss of sensation at the feet and ankle was related to the significant edema.  We did ambulate today with a walker and watched her transfers- and we  did upper extremities exercises.     DIAGNOSTIC DATA (LABS, IMAGING, TESTING) - I reviewed patient records, labs, notes, testing and imaging myself where available.  Lab Results  Component Value Date   WBC 5.9 02/18/2021   HGB 14.3 02/18/2021   HCT 42.7 02/18/2021   MCV 93.8 02/18/2021   PLT 176 02/18/2021      Component Value Date/Time   NA 136 02/18/2021 1047   NA 144 12/15/2020 1100   K 3.9 02/18/2021 1047   CL 95 (L) 02/18/2021 1047   CO2 33 (H) 02/18/2021 1047   GLUCOSE 87 02/18/2021 1047   BUN 19 02/18/2021 1047   BUN 27 12/15/2020 1100   CREATININE 0.54 02/18/2021 1047   CALCIUM 9.1 02/18/2021 1047   PROT 5.9 (L) 01/12/2021 0331   PROT 5.9 (L) 09/20/2020 1436   ALBUMIN 3.5 01/12/2021 0331   ALBUMIN 3.9 09/20/2020 1436   AST 17 01/12/2021 0331   ALT 15 01/12/2021 0331   ALKPHOS 56 01/12/2021 0331   BILITOT 0.4 01/12/2021 0331   BILITOT 0.3 09/20/2020 1436   GFRNONAA >60 02/18/2021 1047   GFRAA 90 09/20/2020 1436   Lab Results  Component Value Date   CHOL 111 03/19/2019   HDL 57 03/19/2019   LDLCALC 43 03/19/2019   TRIG 53 03/19/2019   CHOLHDL 3.0 04/22/2010   Lab Results  Component Value Date   HGBA1C 4.9 06/05/2020   No results found for: VITAMINB12 Lab Results  Component Value Date   TSH 2.961 01/03/2021      ASSESSMENT AND PLAN 75 y.o. year old female  has a  past medical history of Abnormality of gait (03/01/2015), Anxiety, Arthritis, Asthma, Back pain, Bilateral carpal tunnel syndrome (10/04/2020), Cancer ([place]), CHF (congestive heart failure) ([place]), Chronic pain, Complex atypical endometrial hyperplasia, Constipation, Depression, DVT (deep venous thrombosis) (HCC) (1983), Dysrhythmia, Edema, GERD (gastroesophageal reflux disease), HLD (hyperlipidemia), Hypertension, Hypotension, Hypothyroidism, Joint pain, Multiple sclerosis ([place]), OSA on CPAP, Osteoarthritis, PAF (paroxysmal atrial fibrillation) ([place]), Pneumonia (11/2011), and Sleep apnea. here with:  1.  Severe sleepiness  since Covid, needs 3-5 liters on oxygen. AHI residual was 27.8,   sleep apnea on BiPAP-no longer enough central apneas to cause her atrial fib, but we were unable to control her apnea and she is therefor not a cardioversion candidate.  New machine is an ASV to EPAP 12 cm , min PS 3 and max PS of 13 cm water, with 5 liters of oxygen- feels better , wakes up more refreshed, but is still daytime sleepy.    Mrs. Handrich has been extremely compliant has used at 100% of the last 90 days was 100% compliance by time,  residual AHI was better - 6.5/h. F 20 in medium with small headgear- .  2.  Multiple sclerosis on Acthar, , sensory abnormalities and " MS hugs" . 3. 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  4. Atrial fibrillation,chronic and persistent. CHF -  Dr [name] will try to adjust the new pump- of baclofen application by pump. IAs described above, she is now on 700 mcg a day. Now decreased by dr  [name]  by 9%.     We will today address the burning sensation in both lower extremities she also has quite significant tightness of the lateral fascia and sometimes visible spasms.  These improved with the new baclofen pump now she is excessively daytime sleepy again and that may be because the new pump is set too high.  I like for her to have more gabapentin available and I will increase her gabapentin from 300  mg every 6 hours to 300 mg every 4 hours she can use it more frequent during the day but she also will be allowed to take a double dose at bedtime 600 mg to hopefully help her through the night.  Renal cell carcinoma - Dr. McDermiott- [facility].   She is  Post COVID much more SOB and hypopnea is present, low sleep oxygen.    Visit was 40 minutes long and addressed 5 problems.    Toni [name] Seat, MD  03/07/2021, 4:14 PM [facility] [address], [place], [place] [address] [phone]

## 2021-03-08 DIAGNOSIS — Z20822 Contact with and (suspected) exposure to covid-19: Secondary | ICD-10-CM | POA: Diagnosis not present

## 2021-03-09 DIAGNOSIS — N390 Urinary tract infection, site not specified: Secondary | ICD-10-CM | POA: Diagnosis not present

## 2021-03-09 DIAGNOSIS — Z87440 Personal history of urinary (tract) infections: Secondary | ICD-10-CM | POA: Diagnosis not present

## 2021-03-09 DIAGNOSIS — B379 Candidiasis, unspecified: Secondary | ICD-10-CM | POA: Diagnosis not present

## 2021-03-09 DIAGNOSIS — G4733 Obstructive sleep apnea (adult) (pediatric): Secondary | ICD-10-CM | POA: Diagnosis not present

## 2021-03-09 DIAGNOSIS — R21 Rash and other nonspecific skin eruption: Secondary | ICD-10-CM | POA: Diagnosis not present

## 2021-03-09 DIAGNOSIS — E039 Hypothyroidism, unspecified: Secondary | ICD-10-CM | POA: Diagnosis not present

## 2021-03-09 DIAGNOSIS — G35 Multiple sclerosis: Secondary | ICD-10-CM | POA: Diagnosis not present

## 2021-03-09 DIAGNOSIS — B372 Candidiasis of skin and nail: Secondary | ICD-10-CM | POA: Diagnosis not present

## 2021-03-09 DIAGNOSIS — N3001 Acute cystitis with hematuria: Secondary | ICD-10-CM | POA: Diagnosis not present

## 2021-03-09 DIAGNOSIS — N319 Neuromuscular dysfunction of bladder, unspecified: Secondary | ICD-10-CM | POA: Diagnosis not present

## 2021-03-09 DIAGNOSIS — K219 Gastro-esophageal reflux disease without esophagitis: Secondary | ICD-10-CM | POA: Diagnosis not present

## 2021-03-09 DIAGNOSIS — L0390 Cellulitis, unspecified: Secondary | ICD-10-CM | POA: Diagnosis not present

## 2021-03-09 DIAGNOSIS — E785 Hyperlipidemia, unspecified: Secondary | ICD-10-CM | POA: Diagnosis not present

## 2021-03-09 DIAGNOSIS — K5900 Constipation, unspecified: Secondary | ICD-10-CM | POA: Diagnosis not present

## 2021-03-10 ENCOUNTER — Ambulatory Visit (INDEPENDENT_AMBULATORY_CARE_PROVIDER_SITE_OTHER): Payer: Medicare Other | Admitting: Neurology

## 2021-03-10 ENCOUNTER — Encounter: Payer: Self-pay | Admitting: Neurology

## 2021-03-10 DIAGNOSIS — G35 Multiple sclerosis: Secondary | ICD-10-CM | POA: Diagnosis not present

## 2021-03-10 NOTE — Progress Notes (Signed)
The patient comes in today for a baclofen pump reprogram.  She is still somewhat drowsy.  The pump was reprogrammed with a simple continuous rate from 849.4 mcg daily to 789.3 mcg daily.  This represents a 7% drop in the dosing.  The next alarm date will be on 22 May 2021.

## 2021-03-14 ENCOUNTER — Non-Acute Institutional Stay: Payer: Medicare Other

## 2021-03-14 ENCOUNTER — Other Ambulatory Visit: Payer: Self-pay

## 2021-03-14 DIAGNOSIS — Z515 Encounter for palliative care: Secondary | ICD-10-CM

## 2021-03-15 NOTE — Progress Notes (Signed)
COMMUNITY PALLIATIVE CARE SW NOTE  PATIENT NAME: Toni [name] DOB: Jun 10, 1946 MRN: [id number]  PRIMARY CARE PROVIDER: Prince Solian, MD  RESPONSIBLE PARTY:  Acct ID - Guarantor Home Phone Work Phone Relationship Acct Type  [id number] Toni [phone] Self P/F     [place], [place], [place] [address]     PLAN OF CARE and INTERVENTIONS:             GOALS OF CARE/ ADVANCE CARE PLANNING:  Toni [name] is for patient to remain comfortable. Patient is a DNR. SOCIAL/EMOTIONAL/SPIRITUAL ASSESSMENT/ INTERVENTIONS:  SW completed a visit with patient at the facility. Patient was present in the bed. Her sitter-Toni [name] was present with her. Patient reported that she is having some fatigue, weakness and sleepiness related tot he UTI she is being treated for. Patient stated that she has been getting a lot of UTI's and is considering how much longer she wants to be treated. SW provided education to her regarding the palliative care programs, role in her care, and visit frequency. Patient provided a verbal consent to services. She provided a social history on herself. Patient reported that she was born and raised in [place], [place]. 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 Patient has been married to her husband Toni [name] for 14 years old. She has two children:Toni [name] and Toni [name]. She also has a private caregiver 7 days a week from 9 am-7 pm She is a [employer]. Patient is a FULL CODE, but has advanced directives. SW consulted with the facility nurse-[name], RN. Toni [name] advised that she received orders for patient to start and ABT. Toni [name] verbalized no other concerns. Patient remains open to palliative care support.  PATIENT/CAREGIVER EDUCATION/ COPING:  Patient appears to be coping well. Patient  PERSONAL EMERGENCY PLAN:  [id number] can be activated for emergencies.  COMMUNITY RESOURCES  COORDINATION/ HEALTH CARE NAVIGATION:  Patient is a resident in a SNF. She also has a caregiver.  FINANCIAL/LEGAL CONCERNS/INTERVENTIONS:  None.      SOCIAL HX:  Social History   Tobacco Use   Smoking status: Former    Packs/day: 0.50    Years: 7.00    Pack years: 3.50    Types: Cigarettes    Quit date: 08/21/1976    Years since quitting: 44.5   Smokeless tobacco: Never  Substance Use Topics   Alcohol use: Not Currently    CODE STATUS: Patient is a Full Code. ADVANCED DIRECTIVES: Yes MOST FORM COMPLETE:  No HOSPICE EDUCATION PROVIDED: SW provided education regarding differences with hospice and palliative care.  PPS: Patient is alert and oriented x4. Patient is dependent for all personal care needs.   Duration of visit and documentation: 45 minutes.    [address] [place], [place]

## 2021-03-16 NOTE — Telephone Encounter (Signed)
Thanks . Marland KitchenWill await weights and labs- may need more diuretic.  Dr Lemmie Evens

## 2021-03-17 LAB — PROTIME-INR: INR: 1.7 — AB (ref 0.9–1.1)

## 2021-03-18 NOTE — Telephone Encounter (Signed)
Agree with short trial of increased lasix  Dr Lemmie Evens

## 2021-03-21 DIAGNOSIS — G35 Multiple sclerosis: Secondary | ICD-10-CM | POA: Diagnosis not present

## 2021-03-21 DIAGNOSIS — K219 Gastro-esophageal reflux disease without esophagitis: Secondary | ICD-10-CM | POA: Diagnosis not present

## 2021-03-21 DIAGNOSIS — R609 Edema, unspecified: Secondary | ICD-10-CM | POA: Diagnosis not present

## 2021-03-21 DIAGNOSIS — G4700 Insomnia, unspecified: Secondary | ICD-10-CM | POA: Diagnosis not present

## 2021-03-21 DIAGNOSIS — G4733 Obstructive sleep apnea (adult) (pediatric): Secondary | ICD-10-CM | POA: Diagnosis not present

## 2021-03-21 DIAGNOSIS — E785 Hyperlipidemia, unspecified: Secondary | ICD-10-CM | POA: Diagnosis not present

## 2021-03-21 DIAGNOSIS — K5900 Constipation, unspecified: Secondary | ICD-10-CM | POA: Diagnosis not present

## 2021-03-21 DIAGNOSIS — E039 Hypothyroidism, unspecified: Secondary | ICD-10-CM | POA: Diagnosis not present

## 2021-03-21 DIAGNOSIS — I509 Heart failure, unspecified: Secondary | ICD-10-CM | POA: Diagnosis not present

## 2021-03-21 DIAGNOSIS — I4891 Unspecified atrial fibrillation: Secondary | ICD-10-CM | POA: Diagnosis not present

## 2021-03-21 DIAGNOSIS — R197 Diarrhea, unspecified: Secondary | ICD-10-CM | POA: Diagnosis not present

## 2021-03-21 LAB — PROTIME-INR: INR: 1.7 — AB (ref 0.9–1.1)

## 2021-03-22 DIAGNOSIS — N390 Urinary tract infection, site not specified: Secondary | ICD-10-CM | POA: Diagnosis not present

## 2021-03-22 DIAGNOSIS — I10 Essential (primary) hypertension: Secondary | ICD-10-CM | POA: Diagnosis not present

## 2021-03-22 DIAGNOSIS — I5032 Chronic diastolic (congestive) heart failure: Secondary | ICD-10-CM | POA: Diagnosis not present

## 2021-03-22 DIAGNOSIS — R197 Diarrhea, unspecified: Secondary | ICD-10-CM | POA: Diagnosis not present

## 2021-03-24 DIAGNOSIS — K5900 Constipation, unspecified: Secondary | ICD-10-CM | POA: Diagnosis not present

## 2021-03-24 DIAGNOSIS — G8929 Other chronic pain: Secondary | ICD-10-CM | POA: Diagnosis not present

## 2021-03-24 DIAGNOSIS — E785 Hyperlipidemia, unspecified: Secondary | ICD-10-CM | POA: Diagnosis not present

## 2021-03-24 DIAGNOSIS — E559 Vitamin D deficiency, unspecified: Secondary | ICD-10-CM | POA: Diagnosis not present

## 2021-03-24 DIAGNOSIS — I4891 Unspecified atrial fibrillation: Secondary | ICD-10-CM | POA: Diagnosis not present

## 2021-03-24 DIAGNOSIS — R5381 Other malaise: Secondary | ICD-10-CM | POA: Diagnosis not present

## 2021-03-24 DIAGNOSIS — R197 Diarrhea, unspecified: Secondary | ICD-10-CM | POA: Diagnosis not present

## 2021-03-24 DIAGNOSIS — R609 Edema, unspecified: Secondary | ICD-10-CM | POA: Diagnosis not present

## 2021-03-24 DIAGNOSIS — G4700 Insomnia, unspecified: Secondary | ICD-10-CM | POA: Diagnosis not present

## 2021-03-24 DIAGNOSIS — G4733 Obstructive sleep apnea (adult) (pediatric): Secondary | ICD-10-CM | POA: Diagnosis not present

## 2021-04-06 DIAGNOSIS — K5900 Constipation, unspecified: Secondary | ICD-10-CM | POA: Diagnosis not present

## 2021-04-06 DIAGNOSIS — R140 Abdominal distension (gaseous): Secondary | ICD-10-CM | POA: Diagnosis not present

## 2021-04-06 DIAGNOSIS — R609 Edema, unspecified: Secondary | ICD-10-CM | POA: Diagnosis not present

## 2021-04-06 DIAGNOSIS — G629 Polyneuropathy, unspecified: Secondary | ICD-10-CM | POA: Diagnosis not present

## 2021-04-06 DIAGNOSIS — E785 Hyperlipidemia, unspecified: Secondary | ICD-10-CM | POA: Diagnosis not present

## 2021-04-06 DIAGNOSIS — G35 Multiple sclerosis: Secondary | ICD-10-CM | POA: Diagnosis not present

## 2021-04-06 DIAGNOSIS — R110 Nausea: Secondary | ICD-10-CM | POA: Diagnosis not present

## 2021-04-06 DIAGNOSIS — G8929 Other chronic pain: Secondary | ICD-10-CM | POA: Diagnosis not present

## 2021-04-06 DIAGNOSIS — B372 Candidiasis of skin and nail: Secondary | ICD-10-CM | POA: Diagnosis not present

## 2021-04-06 DIAGNOSIS — M1990 Unspecified osteoarthritis, unspecified site: Secondary | ICD-10-CM | POA: Diagnosis not present

## 2021-04-11 DIAGNOSIS — G4733 Obstructive sleep apnea (adult) (pediatric): Secondary | ICD-10-CM | POA: Diagnosis not present

## 2021-04-11 DIAGNOSIS — G35 Multiple sclerosis: Secondary | ICD-10-CM | POA: Diagnosis not present

## 2021-04-11 DIAGNOSIS — N390 Urinary tract infection, site not specified: Secondary | ICD-10-CM | POA: Diagnosis not present

## 2021-04-11 DIAGNOSIS — R110 Nausea: Secondary | ICD-10-CM | POA: Diagnosis not present

## 2021-04-11 DIAGNOSIS — G629 Polyneuropathy, unspecified: Secondary | ICD-10-CM | POA: Diagnosis not present

## 2021-04-11 DIAGNOSIS — M1990 Unspecified osteoarthritis, unspecified site: Secondary | ICD-10-CM | POA: Diagnosis not present

## 2021-04-11 DIAGNOSIS — B372 Candidiasis of skin and nail: Secondary | ICD-10-CM | POA: Diagnosis not present

## 2021-04-11 DIAGNOSIS — H04123 Dry eye syndrome of bilateral lacrimal glands: Secondary | ICD-10-CM | POA: Diagnosis not present

## 2021-04-11 DIAGNOSIS — H9221 Otorrhagia, right ear: Secondary | ICD-10-CM | POA: Diagnosis not present

## 2021-04-11 DIAGNOSIS — R609 Edema, unspecified: Secondary | ICD-10-CM | POA: Diagnosis not present

## 2021-04-11 DIAGNOSIS — G8929 Other chronic pain: Secondary | ICD-10-CM | POA: Diagnosis not present

## 2021-04-11 DIAGNOSIS — E559 Vitamin D deficiency, unspecified: Secondary | ICD-10-CM | POA: Diagnosis not present

## 2021-04-12 DIAGNOSIS — H9221 Otorrhagia, right ear: Secondary | ICD-10-CM | POA: Diagnosis not present

## 2021-04-12 DIAGNOSIS — S00411A Abrasion of right ear, initial encounter: Secondary | ICD-10-CM | POA: Diagnosis not present

## 2021-04-12 DIAGNOSIS — Z7901 Long term (current) use of anticoagulants: Secondary | ICD-10-CM | POA: Diagnosis not present

## 2021-04-13 DIAGNOSIS — B372 Candidiasis of skin and nail: Secondary | ICD-10-CM | POA: Diagnosis not present

## 2021-04-13 DIAGNOSIS — G35 Multiple sclerosis: Secondary | ICD-10-CM | POA: Diagnosis not present

## 2021-04-13 DIAGNOSIS — H9221 Otorrhagia, right ear: Secondary | ICD-10-CM | POA: Diagnosis not present

## 2021-04-13 DIAGNOSIS — G4700 Insomnia, unspecified: Secondary | ICD-10-CM | POA: Diagnosis not present

## 2021-04-13 DIAGNOSIS — E559 Vitamin D deficiency, unspecified: Secondary | ICD-10-CM | POA: Diagnosis not present

## 2021-04-13 DIAGNOSIS — R609 Edema, unspecified: Secondary | ICD-10-CM | POA: Diagnosis not present

## 2021-04-13 DIAGNOSIS — G629 Polyneuropathy, unspecified: Secondary | ICD-10-CM | POA: Diagnosis not present

## 2021-04-13 DIAGNOSIS — L258 Unspecified contact dermatitis due to other agents: Secondary | ICD-10-CM | POA: Diagnosis not present

## 2021-04-13 DIAGNOSIS — K5900 Constipation, unspecified: Secondary | ICD-10-CM | POA: Diagnosis not present

## 2021-04-13 DIAGNOSIS — R110 Nausea: Secondary | ICD-10-CM | POA: Diagnosis not present

## 2021-04-16 DIAGNOSIS — I4891 Unspecified atrial fibrillation: Secondary | ICD-10-CM | POA: Diagnosis not present

## 2021-04-20 DIAGNOSIS — M1990 Unspecified osteoarthritis, unspecified site: Secondary | ICD-10-CM | POA: Diagnosis not present

## 2021-04-20 DIAGNOSIS — E039 Hypothyroidism, unspecified: Secondary | ICD-10-CM | POA: Diagnosis not present

## 2021-04-20 DIAGNOSIS — I509 Heart failure, unspecified: Secondary | ICD-10-CM | POA: Diagnosis not present

## 2021-04-20 DIAGNOSIS — I4891 Unspecified atrial fibrillation: Secondary | ICD-10-CM | POA: Diagnosis not present

## 2021-04-20 DIAGNOSIS — E559 Vitamin D deficiency, unspecified: Secondary | ICD-10-CM | POA: Diagnosis not present

## 2021-04-20 DIAGNOSIS — E785 Hyperlipidemia, unspecified: Secondary | ICD-10-CM | POA: Diagnosis not present

## 2021-04-21 DIAGNOSIS — G8929 Other chronic pain: Secondary | ICD-10-CM | POA: Diagnosis not present

## 2021-04-21 DIAGNOSIS — R110 Nausea: Secondary | ICD-10-CM | POA: Diagnosis not present

## 2021-04-21 DIAGNOSIS — R609 Edema, unspecified: Secondary | ICD-10-CM | POA: Diagnosis not present

## 2021-04-21 DIAGNOSIS — G35 Multiple sclerosis: Secondary | ICD-10-CM | POA: Diagnosis not present

## 2021-04-21 DIAGNOSIS — B372 Candidiasis of skin and nail: Secondary | ICD-10-CM | POA: Diagnosis not present

## 2021-04-21 DIAGNOSIS — K219 Gastro-esophageal reflux disease without esophagitis: Secondary | ICD-10-CM | POA: Diagnosis not present

## 2021-04-21 DIAGNOSIS — E785 Hyperlipidemia, unspecified: Secondary | ICD-10-CM | POA: Diagnosis not present

## 2021-04-21 DIAGNOSIS — I509 Heart failure, unspecified: Secondary | ICD-10-CM | POA: Diagnosis not present

## 2021-04-21 DIAGNOSIS — L258 Unspecified contact dermatitis due to other agents: Secondary | ICD-10-CM | POA: Diagnosis not present

## 2021-04-21 DIAGNOSIS — H9221 Otorrhagia, right ear: Secondary | ICD-10-CM | POA: Diagnosis not present

## 2021-04-21 DIAGNOSIS — R32 Unspecified urinary incontinence: Secondary | ICD-10-CM | POA: Diagnosis not present

## 2021-04-21 DIAGNOSIS — R197 Diarrhea, unspecified: Secondary | ICD-10-CM | POA: Diagnosis not present

## 2021-04-26 ENCOUNTER — Telehealth: Payer: Self-pay | Admitting: Neurology

## 2021-04-26 DIAGNOSIS — E039 Hypothyroidism, unspecified: Secondary | ICD-10-CM | POA: Diagnosis not present

## 2021-04-26 DIAGNOSIS — I4891 Unspecified atrial fibrillation: Secondary | ICD-10-CM | POA: Diagnosis not present

## 2021-04-26 DIAGNOSIS — M1990 Unspecified osteoarthritis, unspecified site: Secondary | ICD-10-CM | POA: Diagnosis not present

## 2021-04-26 DIAGNOSIS — E785 Hyperlipidemia, unspecified: Secondary | ICD-10-CM | POA: Diagnosis not present

## 2021-04-26 DIAGNOSIS — I509 Heart failure, unspecified: Secondary | ICD-10-CM | POA: Diagnosis not present

## 2021-04-26 DIAGNOSIS — E559 Vitamin D deficiency, unspecified: Secondary | ICD-10-CM | POA: Diagnosis not present

## 2021-04-26 NOTE — Telephone Encounter (Signed)
I called the pt and left a vm asking for a call back so we could assist with this.  If pt calls back, please offer 05/17/2021 at 430, and I can open the slot if it works for her schedule.

## 2021-04-26 NOTE — Telephone Encounter (Signed)
Patient returned call, she cannot come the 27th, but can come either Sept  26th, 28th or 29th.  I advised her I'll schedule her for 05/19/21 at 4:30 pm. Patient verbalized understanding, appreciation. Baclofen pump refill appointment scheduled.

## 2021-04-26 NOTE — Telephone Encounter (Signed)
Pt called, would like a call from the nurse to discuss scheduling an appt with Dr. [name] for the end of September for my Baclofen pump.

## 2021-04-27 DIAGNOSIS — B9620 Unspecified Escherichia coli [E. coli] as the cause of diseases classified elsewhere: Secondary | ICD-10-CM | POA: Diagnosis not present

## 2021-04-27 DIAGNOSIS — R300 Dysuria: Secondary | ICD-10-CM | POA: Diagnosis not present

## 2021-04-28 ENCOUNTER — Telehealth: Payer: Self-pay | Admitting: Neurology

## 2021-04-28 DIAGNOSIS — R635 Abnormal weight gain: Secondary | ICD-10-CM | POA: Diagnosis not present

## 2021-04-28 NOTE — Telephone Encounter (Signed)
Pt's husband called stating that he is trying to register the pt for [facility] and they informed him that they are needing a [facility] provider to call and confirm that the pt is being seen by a [facility] Provider so that the pt can start receiving Transportation benefits. [facility] Transportation phone number is [phone]

## 2021-04-28 NOTE — Telephone Encounter (Signed)
Called [facility] transportation, spoke with Suezanne Jacquet who stated they need a referral from a [facility] clinic. He will e mail the form to me, and when it's e mailed  back they'll contact patient to set it up. E mail received and form printed. Form filled out and e mailed back to [place]. Called husband and made him aware. He verbalized understanding, appreciation.

## 2021-05-11 DIAGNOSIS — I4891 Unspecified atrial fibrillation: Secondary | ICD-10-CM | POA: Diagnosis not present

## 2021-05-11 DIAGNOSIS — K219 Gastro-esophageal reflux disease without esophagitis: Secondary | ICD-10-CM | POA: Diagnosis not present

## 2021-05-11 DIAGNOSIS — R110 Nausea: Secondary | ICD-10-CM | POA: Diagnosis not present

## 2021-05-11 DIAGNOSIS — H1131 Conjunctival hemorrhage, right eye: Secondary | ICD-10-CM | POA: Diagnosis not present

## 2021-05-11 DIAGNOSIS — B372 Candidiasis of skin and nail: Secondary | ICD-10-CM | POA: Diagnosis not present

## 2021-05-11 DIAGNOSIS — I509 Heart failure, unspecified: Secondary | ICD-10-CM | POA: Diagnosis not present

## 2021-05-11 DIAGNOSIS — G8929 Other chronic pain: Secondary | ICD-10-CM | POA: Diagnosis not present

## 2021-05-11 DIAGNOSIS — N3289 Other specified disorders of bladder: Secondary | ICD-10-CM | POA: Diagnosis not present

## 2021-05-11 DIAGNOSIS — G4733 Obstructive sleep apnea (adult) (pediatric): Secondary | ICD-10-CM | POA: Diagnosis not present

## 2021-05-11 DIAGNOSIS — L258 Unspecified contact dermatitis due to other agents: Secondary | ICD-10-CM | POA: Diagnosis not present

## 2021-05-11 DIAGNOSIS — G35 Multiple sclerosis: Secondary | ICD-10-CM | POA: Diagnosis not present

## 2021-05-11 DIAGNOSIS — E039 Hypothyroidism, unspecified: Secondary | ICD-10-CM | POA: Diagnosis not present

## 2021-05-12 ENCOUNTER — Telehealth: Payer: Self-pay | Admitting: Internal Medicine

## 2021-05-12 DIAGNOSIS — R509 Fever, unspecified: Secondary | ICD-10-CM | POA: Diagnosis not present

## 2021-05-12 NOTE — Telephone Encounter (Signed)
[facility] ([facility]) do not have the filled out for transportation for the patient. Could you resend the form. Would like a call from the nurse.

## 2021-05-12 NOTE — Telephone Encounter (Signed)
[place] back, spoke to [employer]. Informed him that on 9/8 we emailed the form back to [place]. Due to it being emailed directly to him, It did not go to the pool. They will get it from him and was appreciative of the call back and clarity.

## 2021-05-12 NOTE — Telephone Encounter (Signed)
   Toni [name] DOB: 03/26/1946 MRN: [id number]   RIDER WAIVER AND RELEASE OF LIABILITY  For purposes of improving physical access to our facilities, [facility] is pleased to partner with third parties to provide [facility] patients or other authorized individuals the option of convenient, on-demand ground transportation services (the [place]") through use of the technology service that enables users to request on-demand ground transportation from independent third-party providers.  By opting to use and accept these [employer], I, the undersigned, hereby agree on behalf of myself, and on behalf of any minor child using the Government social research officer for whom I am the parent or legal guardian, as follows:  Government social research officer provided to me are provided by independent third-party transportation providers who are not [employer] or employees and who are unaffiliated with [employer]. [facility] is neither a transportation carrier nor a common or public carrier. [facility] has no control over the quality or safety of the transportation that occurs as a result of the [employer]. [facility] cannot guarantee that any third-party transportation provider will complete any arranged transportation service. [facility] makes no representation, warranty, or guarantee regarding the reliability, timeliness, quality, safety, suitability, or availability of any of the Transport Services or that they will be error free. I fully understand that traveling by vehicle involves risks and dangers of serious bodily injury, including permanent disability, paralysis, and death. I agree, on behalf of myself and on behalf of any minor child using the Transport Services for whom I am the parent or legal guardian, that the entire risk arising out of my use of the [employer] remains solely with me, to the maximum extent permitted under applicable law. The [employer] are provided "as  is" and "as available." [facility] disclaims all representations and warranties, express, implied or statutory, not expressly set out in these terms, including the implied warranties of merchantability and fitness for a particular purpose. I hereby waive and release [facility], its agents, employees, officers, directors, representatives, insurers, attorneys, assigns, successors, subsidiaries, and affiliates from any and all past, present, or future claims, demands, liabilities, actions, causes of action, or suits of any kind directly or indirectly arising from acceptance and use of the [employer]. I further waive and release [facility] and its affiliates from all present and future liability and responsibility for any injury or death to persons or damages to property caused by or related to the use of the [employer]. I have read this Waiver and Release of Liability, and I understand the terms used in it and their legal significance. This Waiver is freely and voluntarily given with the understanding that my right (as well as the right of any minor child for whom I am the parent or legal guardian using the [employer]) to legal recourse against [facility] in connection with the [employer] is knowingly surrendered in return for use of these services.   I attest that I read the consent document to [name], gave Ms. Rusk the opportunity to ask questions and answered the questions asked (if any). I affirm that [name] then provided consent for she's participation in this program.     Legrand Pitts

## 2021-05-13 DIAGNOSIS — B965 Pseudomonas (aeruginosa) (mallei) (pseudomallei) as the cause of diseases classified elsewhere: Secondary | ICD-10-CM | POA: Diagnosis not present

## 2021-05-13 DIAGNOSIS — N390 Urinary tract infection, site not specified: Secondary | ICD-10-CM | POA: Diagnosis not present

## 2021-05-17 ENCOUNTER — Ambulatory Visit: Payer: Medicare Other | Admitting: Gastroenterology

## 2021-05-19 ENCOUNTER — Encounter: Payer: Self-pay | Admitting: Neurology

## 2021-05-19 ENCOUNTER — Other Ambulatory Visit: Payer: Self-pay

## 2021-05-19 ENCOUNTER — Ambulatory Visit (INDEPENDENT_AMBULATORY_CARE_PROVIDER_SITE_OTHER): Payer: Medicare Other | Admitting: Neurology

## 2021-05-19 VITALS — BP 107/62 | HR 68

## 2021-05-19 DIAGNOSIS — G8929 Other chronic pain: Secondary | ICD-10-CM | POA: Diagnosis not present

## 2021-05-19 DIAGNOSIS — H1131 Conjunctival hemorrhage, right eye: Secondary | ICD-10-CM | POA: Diagnosis not present

## 2021-05-19 DIAGNOSIS — I4891 Unspecified atrial fibrillation: Secondary | ICD-10-CM | POA: Diagnosis not present

## 2021-05-19 DIAGNOSIS — G35 Multiple sclerosis: Secondary | ICD-10-CM

## 2021-05-19 DIAGNOSIS — N390 Urinary tract infection, site not specified: Secondary | ICD-10-CM | POA: Diagnosis not present

## 2021-05-19 DIAGNOSIS — E785 Hyperlipidemia, unspecified: Secondary | ICD-10-CM | POA: Diagnosis not present

## 2021-05-19 DIAGNOSIS — B372 Candidiasis of skin and nail: Secondary | ICD-10-CM | POA: Diagnosis not present

## 2021-05-19 DIAGNOSIS — L258 Unspecified contact dermatitis due to other agents: Secondary | ICD-10-CM | POA: Diagnosis not present

## 2021-05-19 DIAGNOSIS — G4700 Insomnia, unspecified: Secondary | ICD-10-CM | POA: Diagnosis not present

## 2021-05-19 DIAGNOSIS — R110 Nausea: Secondary | ICD-10-CM | POA: Diagnosis not present

## 2021-05-19 DIAGNOSIS — H04123 Dry eye syndrome of bilateral lacrimal glands: Secondary | ICD-10-CM | POA: Diagnosis not present

## 2021-05-19 MED ORDER — BACLOFEN 40 MG/20ML IT SOLN
80.0000 mg | Freq: Once | INTRATHECAL | Status: AC
  Administered 2021-05-19: 80 mg via INTRATHECAL

## 2021-05-19 NOTE — Procedures (Signed)
     History: Toni [name] is a 75 year old patient with a history of multiple sclerosis.  She has significant spasticity in the lower extremities and has a baclofen pump in place which has been helpful.  She is doing well with her current baclofen dosing.  She comes in for a refill.  Baclofen pump refill note  The baclofen pump site was cleaned with Betadine solution. A 21-gauge needle was inserted into the pump port site. Approximately 3.5 cc of residual baclofen was removed, the pump indicates a 3.4 cc residual. 40 cc of replacement baclofen was placed into the pump at 2000 mcg/cc concentration.  The pump was reprogrammed for the following settings: The patient was kept at a simple continuous rate getting 789.3 mcg/day  The alarm volume is set at 2.0 cc. The next alarm date is August 23, 2021.  The patient tolerated the procedure well. There were no complications of the above procedure.  The [facility] number is [phone] The baclofen expiration date is May 2024. The baclofen lot number is [id number].

## 2021-05-19 NOTE — Progress Notes (Signed)
Please refer to baclofen pump refill note.

## 2021-05-21 DIAGNOSIS — I4891 Unspecified atrial fibrillation: Secondary | ICD-10-CM | POA: Diagnosis not present

## 2021-05-23 DIAGNOSIS — I4891 Unspecified atrial fibrillation: Secondary | ICD-10-CM | POA: Diagnosis not present

## 2021-05-23 DIAGNOSIS — B372 Candidiasis of skin and nail: Secondary | ICD-10-CM | POA: Diagnosis not present

## 2021-05-23 DIAGNOSIS — R5381 Other malaise: Secondary | ICD-10-CM | POA: Diagnosis not present

## 2021-05-23 DIAGNOSIS — G8929 Other chronic pain: Secondary | ICD-10-CM | POA: Diagnosis not present

## 2021-05-23 DIAGNOSIS — R110 Nausea: Secondary | ICD-10-CM | POA: Diagnosis not present

## 2021-05-23 DIAGNOSIS — G4733 Obstructive sleep apnea (adult) (pediatric): Secondary | ICD-10-CM | POA: Diagnosis not present

## 2021-05-23 DIAGNOSIS — G35 Multiple sclerosis: Secondary | ICD-10-CM | POA: Diagnosis not present

## 2021-05-23 DIAGNOSIS — N390 Urinary tract infection, site not specified: Secondary | ICD-10-CM | POA: Diagnosis not present

## 2021-05-23 DIAGNOSIS — L258 Unspecified contact dermatitis due to other agents: Secondary | ICD-10-CM | POA: Diagnosis not present

## 2021-05-23 DIAGNOSIS — E039 Hypothyroidism, unspecified: Secondary | ICD-10-CM | POA: Diagnosis not present

## 2021-05-23 DIAGNOSIS — K5900 Constipation, unspecified: Secondary | ICD-10-CM | POA: Diagnosis not present

## 2021-05-24 DIAGNOSIS — I10 Essential (primary) hypertension: Secondary | ICD-10-CM | POA: Diagnosis not present

## 2021-05-25 ENCOUNTER — Telehealth: Payer: Self-pay

## 2021-05-25 NOTE — Telephone Encounter (Signed)
-----   Message from Kathrynn Ducking, MD sent at 05/19/2021  5:11 PM EDT ----- This patient's next alarm date is on 23 August 2021.  Need to make sure she has an appointment prior to this for baclofen pump refill, I assume that this may need to be done through Dr. Felecia Shelling or his nurse.

## 2021-05-26 DIAGNOSIS — M1990 Unspecified osteoarthritis, unspecified site: Secondary | ICD-10-CM | POA: Diagnosis not present

## 2021-05-26 DIAGNOSIS — E559 Vitamin D deficiency, unspecified: Secondary | ICD-10-CM | POA: Diagnosis not present

## 2021-05-26 DIAGNOSIS — E785 Hyperlipidemia, unspecified: Secondary | ICD-10-CM | POA: Diagnosis not present

## 2021-05-26 DIAGNOSIS — I4891 Unspecified atrial fibrillation: Secondary | ICD-10-CM | POA: Diagnosis not present

## 2021-05-26 DIAGNOSIS — E039 Hypothyroidism, unspecified: Secondary | ICD-10-CM | POA: Diagnosis not present

## 2021-05-26 DIAGNOSIS — I509 Heart failure, unspecified: Secondary | ICD-10-CM | POA: Diagnosis not present

## 2021-05-28 DIAGNOSIS — N19 Unspecified kidney failure: Secondary | ICD-10-CM | POA: Diagnosis not present

## 2021-05-30 NOTE — Telephone Encounter (Signed)
Please call pt to offer 08/16/21 at 1:30p with Dr. Felecia Shelling (should be 49min). Thank you

## 2021-06-02 DIAGNOSIS — E119 Type 2 diabetes mellitus without complications: Secondary | ICD-10-CM | POA: Diagnosis not present

## 2021-06-07 DIAGNOSIS — H3581 Retinal edema: Secondary | ICD-10-CM | POA: Diagnosis not present

## 2021-06-07 DIAGNOSIS — Z961 Presence of intraocular lens: Secondary | ICD-10-CM | POA: Diagnosis not present

## 2021-06-07 DIAGNOSIS — H4423 Degenerative myopia, bilateral: Secondary | ICD-10-CM | POA: Diagnosis not present

## 2021-06-07 DIAGNOSIS — G35 Multiple sclerosis: Secondary | ICD-10-CM | POA: Diagnosis not present

## 2021-06-08 DIAGNOSIS — K219 Gastro-esophageal reflux disease without esophagitis: Secondary | ICD-10-CM | POA: Diagnosis not present

## 2021-06-08 DIAGNOSIS — G35 Multiple sclerosis: Secondary | ICD-10-CM | POA: Diagnosis not present

## 2021-06-08 DIAGNOSIS — R609 Edema, unspecified: Secondary | ICD-10-CM | POA: Diagnosis not present

## 2021-06-08 DIAGNOSIS — N3289 Other specified disorders of bladder: Secondary | ICD-10-CM | POA: Diagnosis not present

## 2021-06-08 DIAGNOSIS — E785 Hyperlipidemia, unspecified: Secondary | ICD-10-CM | POA: Diagnosis not present

## 2021-06-08 DIAGNOSIS — E559 Vitamin D deficiency, unspecified: Secondary | ICD-10-CM | POA: Diagnosis not present

## 2021-06-08 DIAGNOSIS — B372 Candidiasis of skin and nail: Secondary | ICD-10-CM | POA: Diagnosis not present

## 2021-06-08 DIAGNOSIS — G629 Polyneuropathy, unspecified: Secondary | ICD-10-CM | POA: Diagnosis not present

## 2021-06-08 DIAGNOSIS — N390 Urinary tract infection, site not specified: Secondary | ICD-10-CM | POA: Diagnosis not present

## 2021-06-08 DIAGNOSIS — R110 Nausea: Secondary | ICD-10-CM | POA: Diagnosis not present

## 2021-06-08 DIAGNOSIS — L258 Unspecified contact dermatitis due to other agents: Secondary | ICD-10-CM | POA: Diagnosis not present

## 2021-06-08 DIAGNOSIS — G4700 Insomnia, unspecified: Secondary | ICD-10-CM | POA: Diagnosis not present

## 2021-06-09 DIAGNOSIS — E119 Type 2 diabetes mellitus without complications: Secondary | ICD-10-CM | POA: Diagnosis not present

## 2021-06-09 DIAGNOSIS — Z7901 Long term (current) use of anticoagulants: Secondary | ICD-10-CM | POA: Diagnosis not present

## 2021-06-09 DIAGNOSIS — I110 Hypertensive heart disease with heart failure: Secondary | ICD-10-CM | POA: Diagnosis not present

## 2021-06-10 ENCOUNTER — Non-Acute Institutional Stay: Payer: Medicare Other

## 2021-06-10 ENCOUNTER — Other Ambulatory Visit: Payer: Self-pay

## 2021-06-13 ENCOUNTER — Non-Acute Institutional Stay: Payer: Medicare Other

## 2021-06-13 VITALS — BP 120/70 | HR 74 | Temp 97.7°F | Resp 20

## 2021-06-13 DIAGNOSIS — B372 Candidiasis of skin and nail: Secondary | ICD-10-CM | POA: Diagnosis not present

## 2021-06-13 DIAGNOSIS — R110 Nausea: Secondary | ICD-10-CM | POA: Diagnosis not present

## 2021-06-13 DIAGNOSIS — G35 Multiple sclerosis: Secondary | ICD-10-CM | POA: Diagnosis not present

## 2021-06-13 DIAGNOSIS — K219 Gastro-esophageal reflux disease without esophagitis: Secondary | ICD-10-CM | POA: Diagnosis not present

## 2021-06-13 DIAGNOSIS — R300 Dysuria: Secondary | ICD-10-CM | POA: Diagnosis not present

## 2021-06-13 DIAGNOSIS — N390 Urinary tract infection, site not specified: Secondary | ICD-10-CM | POA: Diagnosis not present

## 2021-06-13 DIAGNOSIS — G8929 Other chronic pain: Secondary | ICD-10-CM | POA: Diagnosis not present

## 2021-06-13 DIAGNOSIS — M1990 Unspecified osteoarthritis, unspecified site: Secondary | ICD-10-CM | POA: Diagnosis not present

## 2021-06-13 DIAGNOSIS — Z515 Encounter for palliative care: Secondary | ICD-10-CM

## 2021-06-13 DIAGNOSIS — L258 Unspecified contact dermatitis due to other agents: Secondary | ICD-10-CM | POA: Diagnosis not present

## 2021-06-13 DIAGNOSIS — G4700 Insomnia, unspecified: Secondary | ICD-10-CM | POA: Diagnosis not present

## 2021-06-13 DIAGNOSIS — E785 Hyperlipidemia, unspecified: Secondary | ICD-10-CM | POA: Diagnosis not present

## 2021-06-13 DIAGNOSIS — R609 Edema, unspecified: Secondary | ICD-10-CM | POA: Diagnosis not present

## 2021-06-14 ENCOUNTER — Other Ambulatory Visit: Payer: Self-pay

## 2021-06-14 DIAGNOSIS — R300 Dysuria: Secondary | ICD-10-CM | POA: Diagnosis not present

## 2021-06-14 DIAGNOSIS — B9620 Unspecified Escherichia coli [E. coli] as the cause of diseases classified elsewhere: Secondary | ICD-10-CM | POA: Diagnosis not present

## 2021-06-14 NOTE — Progress Notes (Signed)
PATIENT NAME: Toni [name] DOB: [birth year] MRN: [id number]  PRIMARY CARE PROVIDER: Prince Solian, MD  RESPONSIBLE PARTY:  Acct ID - Guarantor Home Phone Work Phone Relationship Acct Type  [id number] [name] Self P/F     [place], [place] [address]    PLAN OF CARE and INTERVENTIONS:               1.  GOALS OF CARE/ ADVANCE CARE PLANNING:  Address symptoms as they arise.  Advance planning will need to be further discussed.                2.  PATIENT/CAREGIVER EDUCATION:  Yeast and probiotics               4. PERSONAL EMERGENCY PLAN:  Activate 911 for emergencies.               5.  DISEASE STATUS:  Visit completed with patient and her daughter.  Patient is found in her lift chair reviewing upcoming appointments.  Patient provides update on her status over the last couple of months.  Overall, she feels her MS has been stable.  Current issues are frequent UTI's complicated with limited use of antibiotics due to organism resistance on cultures.  Patient is already being followed by Urology and will see ID next Tuesday due to this issue.  Patient is pending another urine specimen check due to symptoms of UTI.  Patient states she just completed IV antibiotic treatment about 3 weeks ago.  Patient reports ongoing yeast to her groin and under her left breast.  She is currently receiving treatment for this issue.  Discussed causes of yeast and taking probiotics.  Patient confirms she is already on probiotics.  Patient requires a sit to stand lift for transfers.  Once in her wheelchair, she is independent with mobility.   Patient is incontinent of bowel and bladder.  Denies any issues with constipation at this time.  Patient confirms she is taking multiple medications to address constipation that have been effective.   Po intake remains good. Weights are stable with her most recent weight of 192 lbs.   Patient continues with a bi-pap during the night.   No oxygen is required during the day time.  Patient notes this has been in place since she had COVID-19 in December of 2020.    HISTORY OF PRESENT ILLNESS:  75 year old female with MS, HTN, Atrial Fibrillation and  Sleep Apnea.  Patient is being followed by Palliative Care every 8-12 weeks and PRN.  CODE STATUS: Full ADVANCED DIRECTIVES: No MOST FORM: No PPS: 30%   PHYSICAL EXAM:   VITALS: Today's Vitals   06/14/21 0709  BP: 120/70  Pulse: 74  Resp: 20  Temp: 97.7 F (36.5 C)  SpO2: 97%    LUNGS: clear to auscultation  CARDIAC: Cor irreg, irreg RRR}  EXTREMITIES: trace edema to bilateral lower extremities SKIN: Skin color, texture, turgor normal. No rashes or lesions or mobility and turgor normal  NEURO: positive for gait problems       Lorenza Burton, RN

## 2021-06-15 ENCOUNTER — Ambulatory Visit: Payer: Medicare Other | Admitting: Internal Medicine

## 2021-06-15 ENCOUNTER — Telehealth: Payer: Self-pay | Admitting: *Deleted

## 2021-06-15 DIAGNOSIS — N390 Urinary tract infection, site not specified: Secondary | ICD-10-CM | POA: Diagnosis not present

## 2021-06-15 NOTE — Telephone Encounter (Signed)
Patient called and gave new call back number [phone]

## 2021-06-15 NOTE — Telephone Encounter (Addendum)
Patient called and stated "I am having hot flashes and night sweats again. I had them before right after the IUD was placed but they went away. They have been back for about a week now. Is this normal? Plus I need to scheduled an appointment in December with Dr Berline Lopes." Patient sated no pain or bleeding. Explained that the message will be given to the office and someone will call her back. Patient can be reached at [phone]

## 2021-06-15 NOTE — Telephone Encounter (Signed)
Spoke with Toni [name], patient states she has felt like she has had a fever over the past week and is having night sweats. She has been checking her temperature and denies fever. She experienced something similar to this after her IUD was placed. She denies any spotting or pain. She reports she is being evaluated for a UTI. Her urine has been sent off and awaiting results.  Per Dr. Berline Lopes it would be unusual for to have these symptoms from the IUD given that it was placed 1.5 years ago. Instructed patient to keep a log of her symptoms and to bring this with her to her next appointment in December. Instructed to call if she has any new symptoms or has questions or concerns. Patient verbalized understanding.   Patient reports recently moving to a facility ([facility]) in [place]. The best phone number to reach her on is the facility number of [phone].

## 2021-06-17 DIAGNOSIS — Z7901 Long term (current) use of anticoagulants: Secondary | ICD-10-CM | POA: Diagnosis not present

## 2021-06-18 DIAGNOSIS — Z7901 Long term (current) use of anticoagulants: Secondary | ICD-10-CM | POA: Diagnosis not present

## 2021-06-19 DIAGNOSIS — I4891 Unspecified atrial fibrillation: Secondary | ICD-10-CM | POA: Diagnosis not present

## 2021-06-20 DIAGNOSIS — G4700 Insomnia, unspecified: Secondary | ICD-10-CM | POA: Diagnosis not present

## 2021-06-20 DIAGNOSIS — E559 Vitamin D deficiency, unspecified: Secondary | ICD-10-CM | POA: Diagnosis not present

## 2021-06-20 DIAGNOSIS — T148XXA Other injury of unspecified body region, initial encounter: Secondary | ICD-10-CM | POA: Diagnosis not present

## 2021-06-20 DIAGNOSIS — N390 Urinary tract infection, site not specified: Secondary | ICD-10-CM | POA: Diagnosis not present

## 2021-06-20 DIAGNOSIS — R110 Nausea: Secondary | ICD-10-CM | POA: Diagnosis not present

## 2021-06-20 DIAGNOSIS — I4891 Unspecified atrial fibrillation: Secondary | ICD-10-CM | POA: Diagnosis not present

## 2021-06-20 DIAGNOSIS — Z7901 Long term (current) use of anticoagulants: Secondary | ICD-10-CM | POA: Diagnosis not present

## 2021-06-20 DIAGNOSIS — L258 Unspecified contact dermatitis due to other agents: Secondary | ICD-10-CM | POA: Diagnosis not present

## 2021-06-20 DIAGNOSIS — B372 Candidiasis of skin and nail: Secondary | ICD-10-CM | POA: Diagnosis not present

## 2021-06-20 DIAGNOSIS — R300 Dysuria: Secondary | ICD-10-CM | POA: Diagnosis not present

## 2021-06-20 DIAGNOSIS — R609 Edema, unspecified: Secondary | ICD-10-CM | POA: Diagnosis not present

## 2021-06-21 ENCOUNTER — Other Ambulatory Visit: Payer: Self-pay

## 2021-06-21 ENCOUNTER — Ambulatory Visit (INDEPENDENT_AMBULATORY_CARE_PROVIDER_SITE_OTHER): Payer: Medicare Other | Admitting: Infectious Diseases

## 2021-06-21 VITALS — BP 135/66 | HR 62 | Temp 98.3°F

## 2021-06-21 DIAGNOSIS — I4891 Unspecified atrial fibrillation: Secondary | ICD-10-CM | POA: Diagnosis not present

## 2021-06-21 DIAGNOSIS — N390 Urinary tract infection, site not specified: Secondary | ICD-10-CM | POA: Diagnosis not present

## 2021-06-21 NOTE — Progress Notes (Signed)
[facility]                                                             [place], [address], [place], [address]                                                                  Phn. [phone]; Fax: [phone]                                                                             Date: 06/21/21  Reason for Referral: recurrent UTI  Requesting  Provider: Avva   Assessment  Recurrent UTI  Multiple sclerosis leading her to largerly bed bound H/o indwellng catheter- uses purewick currently per patient Bladder spasms Urinary Incontinence  Plan Discussed at length regarding the risk factors she has has that makes her prone to UTIS. She tells me she has burning urination/cloudy urine/bad smelling urine/subjective warmth which indicates her that she might have UTI.   She is applying vaginal estrogen cream, has used pyridium in the past Discussed using methenamine with Vitamin C after completing current abtx course Discussed drinking water at least 1-2 ltrs daily   Complete 7 course of ciprofloxacin ( requestedlast urine cx growng E coli from her facility)  US abdomen to evaluate for any structural abnormalities/stones Fu with Urogynecology for bladder spasms, incontinence, neurogenic bladder Fu after US abdomen done  Fu urine cx reports   All questions and concerns were discussed and addressed. Patient verbalized understanding of the plan. ____________________________________________________________________________________________________________________  HPI: 75 Y O female with PMH of bladder spasms, neurogenic bladder s/p chronic indwelling Foley catheter, Urinary incontinence, h/o multiple sclerosis, chronic atrual fibrillation ( On coumadin), DM< hypothyroidism, Depression whois referred for recurrent UTI. She has a long h/o recurrent UTI for several years in the setting of MS  leading to limited mobility, indwelling catheter/urinary incontinence related to neurogenic bladder. She tells me she has taken all different antibiotics available and was also on suppressive antibiotics in the  past. She lost her mobility and has been since then functionally incontinent after a MS flare in 2019. She follows with [facility] and was last seen on 03/09/2021.  She tells me she had had been several round of antibiotics for UTIs in the last few months ( IV and PO). She was treated with meropenem for 10-14 days for urine cx 9/23 growing PsA. UA WBC 51. She was also treated with Ciprofloxacin for 7 days for urine cx 9/7 growing E coli. ( 9/9-9/15). UA TNTC There was a plan for follow up urine cx and sensi.It appears patient was also treated with IV abtx for a urine cx 8/17 growing PsA. Patient tells me she is taking Ciprofloxacin Day for a urine cx  taken last week growing E coli sensitive to Ciprofloxacin ( per patient report, no records available to me, have requested to fax).   ROS: Constitutional: Negative for fever, chills, activity change, appetite change, fatigue and unexpected weight change.  Respiratory: Negative for cough, shortness of breath Cardiovascular: Negative for chest pain, palpitations and leg swelling.  Gastrointestinal: Negative for nausea, vomiting, abdominal pain, diarrhea/constipation, .  Genitourinary: Negative for dysuria, hematuria, flank pain Musculoskeletal: Negative for myalgias, arthralgia, back pain, joint swelling, arthralgias Skin: Negative for rashes, lesions  Neurological: Negative for weakness, dizziness or headache  Past Medical History:  Diagnosis Date   Abnormality of gait 03/01/2015   Anxiety    Arthritis    "knees" (01/19/2016)   Asthma    as a child    Back pain    Bilateral carpal tunnel syndrome 10/04/2020   Cancer ([place])    / RENAL CELL CARCINOMA - GETS CT SCAN EVERY 6 MONTHS TO WATCH    CHF (congestive heart failure) (HCC)     Chronic pain    "nerve pain from the MS"   Complex atypical endometrial hyperplasia    Constipation    Depression    DVT (deep venous thrombosis) ([place]) 1983   "RLE; may have just been phlebitis; it was before the age of dopplers"   Dysrhythmia    afib    Edema    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' ablation of R GSV 2012   GERD (gastroesophageal reflux disease)    HLD (hyperlipidemia)    Hypertension    Hypotension    Hypothyroidism    Joint pain    Multiple sclerosis ([place])    has had this may 1989-[facility] reg doc   OSA on CPAP    bipap machine - SLEEP APNEA WORSE SINCE COVID IN 1/21 PER PATIENT SETTING HAS INCREASED FROM 9 TO 30    Osteoarthritis    PAF (paroxysmal atrial fibrillation) ([place])    on coumadin; documented on monitor 06/2010 - no longer takes per [facility] 02/09/21   Pneumonia 11/2011   Sleep apnea    uses sleep apnea machine   Past Surgical History:  Procedure Laterality Date   ANTERIOR CERVICAL DECOMP/DISCECTOMY FUSION  01/2004   CARDIAC CATHETERIZATION  06/22/2010   normal coronary arteries, PAF   CATARACT EXTRACTION, BILATERAL Bilateral    CHILD BIRTHS     X2   DILATATION & CURETTAGE/HYSTEROSCOPY WITH MYOSURE N/A 01/15/2020   Procedure: DILATATION & CURETTAGE/HYSTEROSCOPY WITH MYOSURE;  Surgeon: Arvella Nigh, MD;  [facility];  Service: Gynecology;  Laterality: N/A;  pt in wheelchair-has MS   DILATION AND CURETTAGE OF UTERUS     DILATION AND CURETTAGE OF UTERUS N/A 02/16/2020   Procedure: DILATATION AND CURETTAGE;  Surgeon: Lafonda Mosses, MD;  [facility];  Service: Gynecology;  Laterality: N/A;  NOT GENERAL -NO INTUBATION   INFUSION PUMP IMPLANTATION  ~ 2009   baclofen infusion in lower abd   INTRATHECAL PUMP REVISION N/A 02/18/2021   Procedure: Baclofen Pump replacement with possible revision of catheter;  Surgeon: Erline Levine, MD;  Location: [facility];  Service: Neurosurgery;  Laterality: N/A;  3C/RM 20   INTRAUTERINE  DEVICE (IUD) INSERTION N/A 02/16/2020   Procedure: INTRAUTERINE DEVICE (IUD) INSERTION - MIRENA;  Surgeon: Lafonda Mosses, MD;  [facility];  Service: Gynecology;  Laterality: N/A;   PAIN PUMP REVISION N/A 07/29/2014   Procedure: Baclofen pump replacement;  Surgeon: Erline Levine, MD;  Location: [facility];  Service: Neurosurgery;  Laterality: N/A;  Baclofen pump replacement   TONSILLECTOMY AND ADENOIDECTOMY     VARICOSE VEIN SURGERY  ~ [place]  12/16/2010   radiofreq ablation -Dr Elisabeth Cara and [facility]   WISDOM TOOTH EXTRACTION     Current Outpatient Medications on File Prior to Visit  Medication Sig Dispense Refill   acetaminophen (TYLENOL) 500 MG tablet Take 1,000 mg by mouth every 6 (six) hours as needed (fever/headache/pain).     ALPRAZolam (XANAX) 0.5 MG tablet TAKE 1 TABLET BY MOUTH 3  TIMES DAILY AS NEEDED FOR  ANXIETY AND SLEEP (Patient taking differently: Take 0.5 mg by mouth 3 (three) times daily as needed for anxiety or sleep.) 90 tablet 1   ARTIFICIAL TEARS 0.2-0.2-1 % SOLN Apply to eye.     baclofen (LIORESAL) 20 MG tablet Take 1 tablet (20 mg total) by mouth 4 (four) times daily as needed for muscle spasms. (Patient taking differently: Take 20 mg by mouth in the morning, at noon, in the evening, and at bedtime. Hold for lethargy) 30 each 0   Biotin 10000 MCG TABS Take 10,000 mcg by mouth daily after lunch.     buPROPion (WELLBUTRIN XL) 150 MG 24 hr tablet Take 1 tablet (150 mg total) by mouth daily. (Patient taking differently: Take 150 mg by mouth daily after lunch.) 90 tablet 1   Calcium Carbonate-Vitamin D3 600-400 MG-UNIT TABS Take 1 tablet by mouth in the morning and at bedtime. (After breakfast & after lunch)     cetirizine (ZYRTEC) 10 MG tablet Take 10 mg by mouth daily as needed for allergies.      Cholecalciferol (VITAMIN D3) 50 MCG (2000 UT) TABS Take 2,000 Units by mouth in the morning and at bedtime. (After breakfast & after lunch)     ciprofloxacin  (CIPRO) 500 MG tablet Take 500 mg by mouth 2 (two) times daily.     clotrimazole (LOTRIMIN) 1 % cream Apply 1 application topically 3 (three) times daily.     Coenzyme Q10 (COQ10 PO) Take by mouth daily in the afternoon.     diclofenac Sodium (VOLTAREN) 1 % GEL Apply 2 g topically 3 (three) times daily as needed. (Patient taking differently: Apply 2 g topically 3 (three) times daily as needed (pain (applied to bilateral shoulders)).) 100 g 3   diltiazem (CARDIZEM CD) 240 MG 24 hr capsule Take 240 mg by mouth daily.     diltiazem (DILACOR XR) 240 MG 24 hr capsule Take 240 mg by mouth daily after breakfast.     docusate sodium (COLACE) 100 MG capsule Take 1 capsule (100 mg total) by mouth daily. (Patient taking differently: Take 100 mg by mouth at bedtime.) 10 capsule 0   escitalopram (LEXAPRO) 10 MG tablet Take 1 tablet (10 mg total) by mouth daily. (Patient taking differently: Take 10 mg by mouth at bedtime.) 90 tablet 1   estradiol (ESTRACE) 0.1 MG/GM vaginal cream Place vaginally.     famotidine (PEPCID) 20 MG tablet Take 20 mg by mouth at bedtime.   6   famotidine (PEPCID) 20 MG tablet Take 20 mg by mouth daily.     furosemide (LASIX) 20 MG tablet Take 2 tablets (40 mg total) by mouth in the morning, at noon, and at bedtime. (Patient taking differently: Take 40 mg by mouth 2 (two) times daily.) 30 tablet 0   gabapentin (NEURONTIN) 300 MG capsule Mrs. Bozzo will take 300 mg of gabapentin at 6  AM, 10 AM, 2 PM and 6 PM and 600 mg at 10 PM by mouth. 180 capsule 11   gabapentin (NEURONTIN) 300 MG capsule Take 300 mg by mouth in the morning, at noon, in the evening, and at bedtime.     lactobacillus acidophilus (BACID) TABS tablet Take 1 tablet by mouth daily as needed.     lactulose (CHRONULAC) 10 GM/15ML solution Take 10 g by mouth 2 (two) times daily as needed (constipation).     levothyroxine (SYNTHROID) 75 MCG tablet Take by mouth daily.     levothyroxine (SYNTHROID, LEVOTHROID) 75 MCG tablet Take  75 mcg by mouth daily before breakfast. (0500)     linaclotide (LINZESS) 145 MCG CAPS capsule Take 1 capsule (145 mcg total) by mouth daily before breakfast. (Patient taking differently: Take 145 mcg by mouth daily before breakfast. (0500)) 90 capsule 3   melatonin 5 MG TABS Take 5 mg by mouth at bedtime.     Menthol-Zinc Oxide (REMEDY CALAZIME) 0.4-20.5 % PSTE Apply 1 application topically 3 (three) times daily as needed (buttocks/sacrum with incontinences care episodes).     methenamine (HIPREX) 1 g tablet Take 1 g by mouth 2 (two) times daily with a meal.     midodrine (PROAMATINE) 5 MG tablet Take 1 tablet (5 mg total) by mouth 3 (three) times daily with meals. (Patient taking differently: Take 5 mg by mouth as needed. For low BP) 270 tablet 3   Multiple Vitamin (MULTIVITAMIN WITH MINERALS) TABS tablet Take 1 tablet by mouth daily after lunch.     neomycin-bacitracin-polymyxin (NEOSPORIN) ointment Apply 1 application topically 2 (two) times daily as needed for wound care (applied to nose (wear mask rubs)).     NON FORMULARY ASV breathing machine is to be used while napping & at bedtime. Fill canister up to max line with distilled water and turn concentrator to 5LPM.     omeprazole (PRILOSEC) 20 MG capsule Take 20 mg by mouth daily before breakfast. (0500)     ondansetron (ZOFRAN) 8 MG tablet Take 8 mg by mouth daily as needed for nausea or vomiting.     oxybutynin (DITROPAN) 5 MG tablet Take 5 mg by mouth 3 (three) times daily as needed for bladder spasms.      potassium chloride SA (KLOR-CON) 20 MEQ tablet Take 2 tablets (40 mEq total) by mouth daily. (Patient taking differently: Take 20 mEq by mouth 2 (two) times daily. (After lunch & after dinner)) 180 tablet 3   Propylene Glycol-Glycerin (ARTIFICIAL TEARS) 1-0.3 % SOLN Place 2 drops into both eyes daily as needed (dry eyes.).     psyllium (METAMUCIL SMOOTH TEXTURE) 28 % packet Take 1 packet by mouth at bedtime.     simvastatin (ZOCOR) 10 MG  tablet Take 10 mg by mouth at bedtime.     SYRINGE/NEEDLE, DISP, 1 ML (BD LUER-LOK SYRINGE) 25G X 5/8" 1 ML MISC Use as directed to inject  Acthar 10 each prn   traMADol (ULTRAM) 50 MG tablet Take 1 tablet (50 mg total) by mouth every 6 (six) hours as needed for moderate pain. 28 tablet 0   warfarin (COUMADIN) 5 MG tablet TAKE 1/2 TO 1 TABLET BY  MOUTH DAILY AS DIRECTED BY  COUMADIN CLINIC 90 tablet 3   zinc oxide 20 % ointment Apply 1 application topically as needed for irritation (apply labia every shift as needed for redness).     NYAMYC powder Apply topically.     No current facility-administered medications on  file prior to visit.   Allergies  Allergen Reactions   Other Rash    Topical Surgical prep  - severe rash   Chlorhexidine Hives and Other (See Comments)    And, sores appear where applied   Hydrocodone Nausea And Vomiting and Other (See Comments)    Hydrocodone causes vomiting Hydrocodone causes vomiting   Oxycodone Nausea And Vomiting and Other (See Comments)    Oral oxycodone causes vomiting   Sertraline Hives, Swelling, Rash and Other (See Comments)    [DERM][OTHER]RASH WITH SWELLING   Phenol Hives   Social History   Socioeconomic History   Marital status: Married    Spouse name: [name]   Number of children: 2   Years of education: COLLEGE   Highest education level: Not on file  Occupational History   Occupation: RETIRED  Tobacco Use   Smoking status: Former    Packs/day: 0.50    Years: 7.00    Pack years: 3.50    Types: Cigarettes    Quit date: 08/21/1976    Years since quitting: 44.8   Smokeless tobacco: Never  Vaping Use   Vaping Use: Never used  Substance and Sexual Activity   Alcohol use: Not Currently   Drug use: No   Sexual activity: Not Currently    Birth control/protection: Post-menopausal  Other Topics Concern   Not on file  Social History Narrative   Lives in [place] with Husband [name] [phone], [phone]   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   Right  handed   Drinks a little caffeine in coffee sometimes    Social Determinants of Health   Financial Resource Strain: Not on file  Food Insecurity: Not on file  Transportation Needs: Not on file  Physical Activity: Not on file  Stress: Not on file  Social Connections: Not on file  Intimate Partner Violence: Not on file   Family History  Problem Relation Age of Onset   Coronary artery disease Father        at age 32   Hyperlipidemia Father    Thyroid disease Father    Coronary artery disease Maternal Grandmother    Depression Maternal Grandmother    Cancer Paternal Grandmother        breast   Depression Mother    Sudden death Mother    Anxiety disorder Mother    Alcoholism Son      Vitals BP 135/66   Pulse 62   Temp 98.3 F (36.8 C) (Oral)    Examination  General - not in acute distress, comfortably sitting in wheel chair HEENT - no pallor and no icterus Chest - b/l clear air entry, no additional sounds CVS- Normal s1s2, RRR Abdomen - Soft, Non tender , non distended Ext- no pedal edema Neuro: not examined  Psych : calm and cooperative   Recent labs CBC Latest Ref Rng & Units 02/18/2021 01/12/2021 01/11/2021  WBC 4.0 - 10.5 K/uL 5.9 6.1 6.8  Hemoglobin 12.0 - 15.0 g/dL 14.3 13.3 13.4  Hematocrit 36.0 - 46.0 % 42.7 40.2 40.8  Platelets 150 - 400 K/uL 176 186 188   CMP Latest Ref Rng & Units 02/18/2021 01/12/2021 01/11/2021  Glucose 70 - 99 mg/dL 87 71 87  BUN 8 - 23 mg/dL 19 20 17   Creatinine 0.44 - 1.00 mg/dL 0.54 0.52 0.62  Sodium 135 - 145 mmol/L 136 139 138  Potassium 3.5 - 5.1 mmol/L 3.9 4.1 4.0  Chloride 98 - 111 mmol/L 95(L) 96(L) 98  CO2 22 -  32 mmol/L 33(H) 35(H) 35(H)  Calcium 8.9 - 10.3 mg/dL 9.1 9.0 8.9  Total Protein 6.5 - 8.1 g/dL - 5.9(L) 5.8(L)  Total Bilirubin 0.3 - 1.2 mg/dL - 0.4 0.2(L)  Alkaline Phos 38 - 126 U/L - 56 49  AST 15 - 41 U/L - 17 18  ALT 0 - 44 U/L - 15 18     Pertinent Microbiology Results for orders placed or performed  during the hospital encounter of 02/18/21  SARS Coronavirus 2 by RT PCR (hospital order, performed in [facility] hospital lab) Nasopharyngeal Nasopharyngeal Swab     Status: None   Collection Time: 02/18/21 12:38 PM   Specimen: Nasopharyngeal Swab  Result Value Ref Range Status   SARS Coronavirus 2 NEGATIVE NEGATIVE Final    Comment: (NOTE) SARS-CoV-2 target nucleic acids are NOT DETECTED.  The SARS-CoV-2 RNA is generally detectable in upper and lower respiratory specimens during the acute phase of infection. The lowest concentration of SARS-CoV-2 viral copies this assay can detect is 250 copies / mL. A negative result does not preclude SARS-CoV-2 infection and should not be used as the sole basis for treatment or other patient management decisions.  A negative result may occur with improper specimen collection / handling, submission of specimen other than nasopharyngeal swab, presence of viral mutation(s) within the areas targeted by this assay, and inadequate number of viral copies (<250 copies / mL). A negative result must be combined with clinical observations, patient history, and epidemiological information.  Fact Sheet for Patients:   StrictlyIdeas.no  Fact Sheet for Healthcare Providers: BankingDealers.co.za  This test is not yet approved or  cleared by the [place] FDA and has been authorized for detection and/or diagnosis of SARS-CoV-2 by FDA under an Emergency Use Authorization (EUA).  This EUA will remain in effect (meaning this test can be used) for the duration of the COVID-19 declaration under Section 564(b)(1) of the Act, 21 U.S.C. section 360bbb-3(b)(1), unless the authorization is terminated or revoked sooner.  Performed at [facility], [place] [address], [place], [place] [address]    *Note: Due to a large number of results and/or encounters for the requested time period, some results have not been  displayed. A complete set of results can be found in Results Review.    Pertinent Imaging All pertinent labs/Imagings/notes reviewed. All pertinent plain films and CT images have been personally visualized and interpreted; radiology reports have been reviewed. Decision making incorporated into the Impression / Recommendations.  I have spent a total of 60 minutes of face-to-face and non-face-to-face time, excluding clinical staff time, preparing to see patient, ordering tests and/or medications, and provide counseling the patient    Electronically signed by:  Rosiland Oz, MD Infectious Disease Physician [facility] [address] [place], [place] [address] Phone: [phone]  Fax: [facility]  [place], [place], [place], [address]                                                                  Phn. [phone]; Fax: [phone]                                                                             Date: 06/21/21  Reason for Referral: recurrent UTI  Requesting  Provider: Avva Ravisankar  Assessment   Plan    All questions and concerns were discussed and addressed. Patient verbalized understanding of the plan. ____________________________________________________________________________________________________________________  HPI:  ROS: Constitutional: Negative for fever, chills, activity change, appetite change, fatigue and unexpected weight change.  Respiratory: Negative for cough, shortness of breath Cardiovascular: Negative for chest pain, palpitations and leg swelling.  Gastrointestinal: Negative for nausea, vomiting, abdominal pain, diarrhea/constipation, .  Genitourinary: Negative for dysuria, hematuria, flank  pain Musculoskeletal: Negative for myalgias, arthralgia, back pain, joint swelling, arthralgias Skin: Negative for rashes, lesions  Neurological: Negative for weakness, dizziness or headache  Past Medical History:  Diagnosis Date   Abnormality of gait 03/01/2015   Anxiety    Arthritis    "knees" (01/19/2016)   Asthma    as a child    Back pain    Bilateral carpal tunnel syndrome 10/04/2020   Cancer ([place])    / RENAL CELL CARCINOMA - GETS CT SCAN EVERY 6 MONTHS TO WATCH    CHF (congestive heart failure) (HCC)    Chronic pain    "nerve pain from the MS"   Complex atypical endometrial hyperplasia    Constipation    Depression    DVT (deep venous thrombosis) ([place]) 1983   "RLE; may have just been phlebitis; it was before the age of dopplers"   Dysrhythmia    afib    Edema    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' ablation of R GSV 2012   GERD (gastroesophageal reflux disease)    HLD (hyperlipidemia)    Hypertension    Hypotension    Hypothyroidism    Joint pain    Multiple sclerosis ([place])    has had this may 1989-[facility] reg doc   OSA on CPAP    bipap machine - SLEEP APNEA WORSE SINCE COVID IN 1/21 PER PATIENT SETTING HAS INCREASED FROM 9 TO 30    Osteoarthritis    PAF (paroxysmal atrial fibrillation) ([place])    on coumadin; documented on monitor 06/2010 - no longer takes per [facility] 02/09/21   Pneumonia 11/2011   Sleep apnea    uses sleep apnea machine   Past Surgical History:  Procedure Laterality Date   ANTERIOR CERVICAL DECOMP/DISCECTOMY FUSION  01/2004   CARDIAC CATHETERIZATION  06/22/2010   normal coronary arteries, PAF   CATARACT EXTRACTION, BILATERAL Bilateral    CHILD BIRTHS     X2   DILATATION & CURETTAGE/HYSTEROSCOPY WITH MYOSURE N/A 01/15/2020   Procedure: DILATATION & CURETTAGE/HYSTEROSCOPY WITH MYOSURE;  Surgeon: Arvella Nigh, MD;  [facility];  Service: Gynecology;  Laterality: N/A;  pt in wheelchair-has MS   DILATION AND CURETTAGE  OF UTERUS     DILATION AND CURETTAGE OF UTERUS N/A 02/16/2020   Procedure: DILATATION AND CURETTAGE;  Surgeon: Lafonda Mosses, MD;  [facility];  Service: Gynecology;  Laterality: N/A;  NOT GENERAL -NO INTUBATION   INFUSION PUMP IMPLANTATION  ~ 2009   baclofen infusion in lower abd   INTRATHECAL PUMP REVISION N/A 02/18/2021   Procedure: Baclofen Pump replacement with possible revision of catheter;  Surgeon: Erline Levine, MD;  Location: [facility] [facility];  Service: Neurosurgery;  Laterality: N/A;  3C/RM 20   INTRAUTERINE DEVICE (IUD) INSERTION N/A 02/16/2020   Procedure: INTRAUTERINE DEVICE (IUD) INSERTION - MIRENA;  Surgeon: Lafonda Mosses, MD;  [facility];  Service: Gynecology;  Laterality: N/A;   PAIN PUMP REVISION N/A 07/29/2014   Procedure: Baclofen pump replacement;  Surgeon: Erline Levine, MD;  Location: [facility];  Service: Neurosurgery;  Laterality: N/A;  Baclofen pump replacement   TONSILLECTOMY AND ADENOIDECTOMY     VARICOSE VEIN SURGERY  ~ [place]  12/16/2010   radiofreq ablation -Dr Elisabeth Cara and [facility]   WISDOM TOOTH EXTRACTION     Current Outpatient Medications on File Prior to Visit  Medication Sig Dispense Refill   acetaminophen (TYLENOL) 500 MG tablet Take 1,000 mg by mouth every 6 (six) hours as needed (fever/headache/pain).     ALPRAZolam (XANAX) 0.5 MG tablet TAKE 1 TABLET BY MOUTH 3  TIMES DAILY AS NEEDED FOR  ANXIETY AND SLEEP (Patient taking differently: Take 0.5 mg by mouth 3 (three) times daily as needed for anxiety or sleep.) 90 tablet 1   baclofen (LIORESAL) 20 MG tablet Take 1 tablet (20 mg total) by mouth 4 (four) times daily as needed for muscle spasms. (Patient taking differently: Take 20 mg by mouth in the morning, at noon, in the evening, and at bedtime. Hold for lethargy) 30 each 0   Biotin 10000 MCG TABS Take 10,000 mcg by mouth daily after lunch.     buPROPion (WELLBUTRIN XL) 150 MG 24 hr tablet Take 1 tablet (150 mg total) by mouth daily.  (Patient taking differently: Take 150 mg by mouth daily after lunch.) 90 tablet 1   Calcium Carbonate-Vitamin D3 600-400 MG-UNIT TABS Take 1 tablet by mouth in the morning and at bedtime. (After breakfast & after lunch)     cetirizine (ZYRTEC) 10 MG tablet Take 10 mg by mouth daily as needed for allergies.      Cholecalciferol (VITAMIN D3) 50 MCG (2000 UT) TABS Take 2,000 Units by mouth in the morning and at bedtime. (After breakfast & after lunch)     Coenzyme Q10 (COQ10 PO) Take by mouth daily in the afternoon.     diclofenac Sodium (VOLTAREN) 1 % GEL Apply 2 g topically 3 (three) times daily as needed. (Patient taking differently: Apply 2 g topically 3 (three) times daily as needed (pain (applied to bilateral shoulders)).) 100 g 3   diltiazem (DILACOR XR) 240 MG 24 hr capsule Take 240 mg by mouth daily after breakfast.     docusate sodium (COLACE) 100 MG capsule Take 1 capsule (100 mg total) by mouth daily. (Patient taking differently: Take 100 mg by mouth at bedtime.) 10 capsule 0   escitalopram (LEXAPRO) 10 MG tablet Take 1 tablet (10 mg total) by mouth daily. (Patient taking differently: Take 10 mg by mouth at  bedtime.) 90 tablet 1   famotidine (PEPCID) 20 MG tablet Take 20 mg by mouth at bedtime.   6   furosemide (LASIX) 20 MG tablet Take 2 tablets (40 mg total) by mouth in the morning, at noon, and at bedtime. (Patient taking differently: Take 40 mg by mouth 2 (two) times daily.) 30 tablet 0   gabapentin (NEURONTIN) 300 MG capsule [name] will take 300 mg of gabapentin at 6 AM, 10 AM, 2 PM and 6 PM and 600 mg at 10 PM by mouth. 180 capsule 11   lactobacillus acidophilus (BACID) TABS tablet Take 1 tablet by mouth daily as needed.     lactulose (CHRONULAC) 10 GM/15ML solution Take 10 g by mouth 2 (two) times daily as needed (constipation).     levothyroxine (SYNTHROID, LEVOTHROID) 75 MCG tablet Take 75 mcg by mouth daily before breakfast. (0500)     linaclotide (LINZESS) 145 MCG CAPS capsule  Take 1 capsule (145 mcg total) by mouth daily before breakfast. (Patient taking differently: Take 145 mcg by mouth daily before breakfast. (0500)) 90 capsule 3   melatonin 5 MG TABS Take 5 mg by mouth at bedtime.     Menthol-Zinc Oxide (REMEDY CALAZIME) 0.4-20.5 % PSTE Apply 1 application topically 3 (three) times daily as needed (buttocks/sacrum with incontinences care episodes).     midodrine (PROAMATINE) 5 MG tablet Take 1 tablet (5 mg total) by mouth 3 (three) times daily with meals. (Patient taking differently: Take 5 mg by mouth as needed. For low BP) 270 tablet 3   Multiple Vitamin (MULTIVITAMIN WITH MINERALS) TABS tablet Take 1 tablet by mouth daily after lunch.     neomycin-bacitracin-polymyxin (NEOSPORIN) ointment Apply 1 application topically 2 (two) times daily as needed for wound care (applied to nose (wear mask rubs)).     NON FORMULARY ASV breathing machine is to be used while napping & at bedtime. Fill canister up to max line with distilled water and turn concentrator to 5LPM.     omeprazole (PRILOSEC) 20 MG capsule Take 20 mg by mouth daily before breakfast. (0500)     ondansetron (ZOFRAN) 8 MG tablet Take 8 mg by mouth daily as needed for nausea or vomiting.     oxybutynin (DITROPAN) 5 MG tablet Take 5 mg by mouth 3 (three) times daily as needed for bladder spasms.      potassium chloride SA (KLOR-CON) 20 MEQ tablet Take 2 tablets (40 mEq total) by mouth daily. (Patient taking differently: Take 20 mEq by mouth 2 (two) times daily. (After lunch & after dinner)) 180 tablet 3   Propylene Glycol-Glycerin (ARTIFICIAL TEARS) 1-0.3 % SOLN Place 2 drops into both eyes daily as needed (dry eyes.).     psyllium (METAMUCIL SMOOTH TEXTURE) 28 % packet Take 1 packet by mouth at bedtime.     simvastatin (ZOCOR) 10 MG tablet Take 10 mg by mouth at bedtime.     SYRINGE/NEEDLE, DISP, 1 ML (BD LUER-LOK SYRINGE) 25G X 5/8" 1 ML MISC Use as directed to inject  Acthar 10 each prn   traMADol (ULTRAM) 50  MG tablet Take 1 tablet (50 mg total) by mouth every 6 (six) hours as needed for moderate pain. 28 tablet 0   warfarin (COUMADIN) 5 MG tablet TAKE 1/2 TO 1 TABLET BY  MOUTH DAILY AS DIRECTED BY  [facility] 90 tablet 3   zinc oxide 20 % ointment Apply 1 application topically as needed for irritation (apply labia every shift as needed for redness).  No current facility-administered medications on file prior to visit.   Allergies  Allergen Reactions   Other Rash    Topical Surgical prep  - severe rash   Chlorhexidine Hives and Other (See Comments)    And, sores appear where applied   Hydrocodone Nausea And Vomiting and Other (See Comments)    Hydrocodone causes vomiting Hydrocodone causes vomiting   Oxycodone Nausea And Vomiting and Other (See Comments)    Oral oxycodone causes vomiting   Sertraline Hives, Swelling, Rash and Other (See Comments)    [DERM][OTHER]RASH WITH SWELLING   Social History   Socioeconomic History   Marital status: Married    Spouse name: [name]   Number of children: 2   Years of education: COLLEGE   Highest education level: Not on file  Occupational History   Occupation: RETIRED  Tobacco Use   Smoking status: Former    Packs/day: 0.50    Years: 7.00    Pack years: 3.50    Types: Cigarettes    Quit date: 08/21/1976    Years since quitting: 44.8   Smokeless tobacco: Never  Vaping Use   Vaping Use: Never used  Substance and Sexual Activity   Alcohol use: Not Currently   Drug use: No   Sexual activity: Not Currently    Birth control/protection: Post-menopausal  Other Topics Concern   Not on file  Social History Narrative   Lives in [place] with Husband [name] [phone], [phone]   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   Examination  General - not in acute distress, comfortably sitting in chair HEENT - PEERLA, no pallor and no icterus Chest - b/l clear air entry, no additional sounds CVS- Normal s1s2, RRR Abdomen - Soft, Non tender , non distended Ext- no pedal edema Neuro: grossly normal Back - WNL Psych : calm and cooperative   Recent labs CBC Latest Ref Rng & Units 02/18/2021 01/12/2021 01/11/2021  WBC 4.0 - 10.5 K/uL 5.9 6.1 6.8  Hemoglobin 12.0 - 15.0 g/dL 14.3 13.3 13.4  Hematocrit 36.0 - 46.0 % 42.7 40.2 40.8  Platelets 150 - 400 K/uL 176 186 188   CMP Latest Ref Rng & Units 02/18/2021 01/12/2021 01/11/2021  Glucose 70 - 99 mg/dL 87 71 87  BUN 8 - 23 mg/dL 19 20 17   Creatinine 0.44 - 1.00 mg/dL 0.54 0.52 0.62  Sodium 135 - 145 mmol/L 136 139 138  Potassium 3.5 - 5.1 mmol/L 3.9 4.1 4.0  Chloride 98 - 111 mmol/L 95(L) 96(L) 98  CO2 22 - 32 mmol/L 33(H) 35(H) 35(H)  Calcium 8.9 - 10.3 mg/dL 9.1 9.0 8.9  Total Protein 6.5 - 8.1 g/dL - 5.9(L) 5.8(L)  Total Bilirubin 0.3 - 1.2 mg/dL - 0.4 0.2(L)  Alkaline Phos 38 - 126 U/L - 56 49  AST 15 - 41 U/L - 17 18  ALT 0 - 44 U/L - 15 18     Pertinent Microbiology    Pertinent Imaging    All pertinent labs/Imagings/notes reviewed. All pertinent plain films and CT images have been personally  visualized and interpreted; radiology reports have been reviewed. Decision making incorporated into the Impression / Recommendations.  I have spent a total of 60 minutes of face-to-face and non-face-to-face time, excluding clinical staff time, preparing to see patient, ordering tests and/or medications, and provide counseling the patient    Electronically signed by:  Rosiland Oz, MD Infectious Disease Physician [facility] [address] [place], [place] [address] Phone: [phone]  Fax: [phone]

## 2021-06-22 ENCOUNTER — Encounter: Payer: Self-pay | Admitting: Infectious Diseases

## 2021-06-22 ENCOUNTER — Telehealth: Payer: Self-pay

## 2021-06-22 DIAGNOSIS — Z7901 Long term (current) use of anticoagulants: Secondary | ICD-10-CM | POA: Diagnosis not present

## 2021-06-22 NOTE — Telephone Encounter (Signed)
RN contacted from [facility] about medication plan. States that the note sent by the provider might need to be changed based off of additional information from facility.   Stated that trimethoprim 300mg  for 30 days was prescribed by a different provider on 10/26 however has not been started. States that the note Dr. West Bali wrote requested she continue the methenamine however that medication was stopped as she was placed on cipro on 10/28 through 11/4 for 7 days to treat a UTI.   Requested this information be faxed to our office for review and for urine cultures to be sent as well so Dr. West Bali can clarify any confusion and provide instruction. Awaiting fax from facility.  Anieya Helman Lorita Officer, RN

## 2021-06-22 NOTE — Telephone Encounter (Signed)
I would like to see the last urine culture reports which per patient was growing E coli to decide on the Ciprofloxacin she is taking currently.   Can hold off on the methenamine while she is on Ciprofloxacin and can be started on that for a month after completing  cipro course.  I believe the trimethoprim reportedly prescribed by another provider is for UTI prophylaxis which has its own pros and cons. I don't recommend i t from an ID standpoint.   Thanks

## 2021-06-23 DIAGNOSIS — N390 Urinary tract infection, site not specified: Secondary | ICD-10-CM | POA: Diagnosis not present

## 2021-06-23 DIAGNOSIS — K219 Gastro-esophageal reflux disease without esophagitis: Secondary | ICD-10-CM | POA: Diagnosis not present

## 2021-06-23 DIAGNOSIS — E785 Hyperlipidemia, unspecified: Secondary | ICD-10-CM | POA: Diagnosis not present

## 2021-06-23 DIAGNOSIS — I4891 Unspecified atrial fibrillation: Secondary | ICD-10-CM | POA: Diagnosis not present

## 2021-06-23 DIAGNOSIS — B3789 Other sites of candidiasis: Secondary | ICD-10-CM | POA: Diagnosis not present

## 2021-06-24 DIAGNOSIS — Z79899 Other long term (current) drug therapy: Secondary | ICD-10-CM | POA: Diagnosis not present

## 2021-06-24 DIAGNOSIS — Z7901 Long term (current) use of anticoagulants: Secondary | ICD-10-CM | POA: Diagnosis not present

## 2021-06-27 ENCOUNTER — Encounter: Payer: Self-pay | Admitting: Neurology

## 2021-06-27 DIAGNOSIS — Z7901 Long term (current) use of anticoagulants: Secondary | ICD-10-CM | POA: Diagnosis not present

## 2021-06-28 DIAGNOSIS — M1990 Unspecified osteoarthritis, unspecified site: Secondary | ICD-10-CM | POA: Diagnosis not present

## 2021-06-28 DIAGNOSIS — R451 Restlessness and agitation: Secondary | ICD-10-CM | POA: Diagnosis not present

## 2021-06-28 DIAGNOSIS — E559 Vitamin D deficiency, unspecified: Secondary | ICD-10-CM | POA: Diagnosis not present

## 2021-06-28 DIAGNOSIS — E039 Hypothyroidism, unspecified: Secondary | ICD-10-CM | POA: Diagnosis not present

## 2021-06-28 DIAGNOSIS — I509 Heart failure, unspecified: Secondary | ICD-10-CM | POA: Diagnosis not present

## 2021-06-28 DIAGNOSIS — I4891 Unspecified atrial fibrillation: Secondary | ICD-10-CM | POA: Diagnosis not present

## 2021-06-28 DIAGNOSIS — E785 Hyperlipidemia, unspecified: Secondary | ICD-10-CM | POA: Diagnosis not present

## 2021-07-01 DIAGNOSIS — Z7901 Long term (current) use of anticoagulants: Secondary | ICD-10-CM | POA: Diagnosis not present

## 2021-07-03 DIAGNOSIS — I4891 Unspecified atrial fibrillation: Secondary | ICD-10-CM | POA: Diagnosis not present

## 2021-07-04 ENCOUNTER — Telehealth: Payer: Self-pay

## 2021-07-04 DIAGNOSIS — E559 Vitamin D deficiency, unspecified: Secondary | ICD-10-CM | POA: Diagnosis not present

## 2021-07-04 DIAGNOSIS — L258 Unspecified contact dermatitis due to other agents: Secondary | ICD-10-CM | POA: Diagnosis not present

## 2021-07-04 DIAGNOSIS — N390 Urinary tract infection, site not specified: Secondary | ICD-10-CM | POA: Diagnosis not present

## 2021-07-04 DIAGNOSIS — E039 Hypothyroidism, unspecified: Secondary | ICD-10-CM | POA: Diagnosis not present

## 2021-07-04 DIAGNOSIS — R609 Edema, unspecified: Secondary | ICD-10-CM | POA: Diagnosis not present

## 2021-07-04 DIAGNOSIS — G4733 Obstructive sleep apnea (adult) (pediatric): Secondary | ICD-10-CM | POA: Diagnosis not present

## 2021-07-04 DIAGNOSIS — G35 Multiple sclerosis: Secondary | ICD-10-CM | POA: Diagnosis not present

## 2021-07-04 DIAGNOSIS — R110 Nausea: Secondary | ICD-10-CM | POA: Diagnosis not present

## 2021-07-04 DIAGNOSIS — K219 Gastro-esophageal reflux disease without esophagitis: Secondary | ICD-10-CM | POA: Diagnosis not present

## 2021-07-04 DIAGNOSIS — B372 Candidiasis of skin and nail: Secondary | ICD-10-CM | POA: Diagnosis not present

## 2021-07-04 DIAGNOSIS — R300 Dysuria: Secondary | ICD-10-CM | POA: Diagnosis not present

## 2021-07-04 NOTE — Telephone Encounter (Signed)
Mychart message sent with [name] results.   Toni Donlon Lorita Officer, RN

## 2021-07-05 DIAGNOSIS — I4891 Unspecified atrial fibrillation: Secondary | ICD-10-CM | POA: Diagnosis not present

## 2021-07-05 DIAGNOSIS — N390 Urinary tract infection, site not specified: Secondary | ICD-10-CM | POA: Diagnosis not present

## 2021-07-05 DIAGNOSIS — B9620 Unspecified Escherichia coli [E. coli] as the cause of diseases classified elsewhere: Secondary | ICD-10-CM | POA: Diagnosis not present

## 2021-07-06 DIAGNOSIS — N390 Urinary tract infection, site not specified: Secondary | ICD-10-CM | POA: Diagnosis not present

## 2021-07-06 DIAGNOSIS — N3289 Other specified disorders of bladder: Secondary | ICD-10-CM | POA: Diagnosis not present

## 2021-07-06 DIAGNOSIS — I4891 Unspecified atrial fibrillation: Secondary | ICD-10-CM | POA: Diagnosis not present

## 2021-07-06 DIAGNOSIS — K219 Gastro-esophageal reflux disease without esophagitis: Secondary | ICD-10-CM | POA: Diagnosis not present

## 2021-07-06 DIAGNOSIS — B372 Candidiasis of skin and nail: Secondary | ICD-10-CM | POA: Diagnosis not present

## 2021-07-06 DIAGNOSIS — G35 Multiple sclerosis: Secondary | ICD-10-CM | POA: Diagnosis not present

## 2021-07-06 DIAGNOSIS — L258 Unspecified contact dermatitis due to other agents: Secondary | ICD-10-CM | POA: Diagnosis not present

## 2021-07-06 DIAGNOSIS — E785 Hyperlipidemia, unspecified: Secondary | ICD-10-CM | POA: Diagnosis not present

## 2021-07-06 DIAGNOSIS — G4700 Insomnia, unspecified: Secondary | ICD-10-CM | POA: Diagnosis not present

## 2021-07-06 DIAGNOSIS — R300 Dysuria: Secondary | ICD-10-CM | POA: Diagnosis not present

## 2021-07-06 DIAGNOSIS — I959 Hypotension, unspecified: Secondary | ICD-10-CM | POA: Diagnosis not present

## 2021-07-06 DIAGNOSIS — R609 Edema, unspecified: Secondary | ICD-10-CM | POA: Diagnosis not present

## 2021-07-07 DIAGNOSIS — Z7901 Long term (current) use of anticoagulants: Secondary | ICD-10-CM | POA: Diagnosis not present

## 2021-07-11 DIAGNOSIS — G8929 Other chronic pain: Secondary | ICD-10-CM | POA: Diagnosis not present

## 2021-07-11 DIAGNOSIS — G4733 Obstructive sleep apnea (adult) (pediatric): Secondary | ICD-10-CM | POA: Diagnosis not present

## 2021-07-11 DIAGNOSIS — L258 Unspecified contact dermatitis due to other agents: Secondary | ICD-10-CM | POA: Diagnosis not present

## 2021-07-11 DIAGNOSIS — H04123 Dry eye syndrome of bilateral lacrimal glands: Secondary | ICD-10-CM | POA: Diagnosis not present

## 2021-07-11 DIAGNOSIS — I509 Heart failure, unspecified: Secondary | ICD-10-CM | POA: Diagnosis not present

## 2021-07-11 DIAGNOSIS — E559 Vitamin D deficiency, unspecified: Secondary | ICD-10-CM | POA: Diagnosis not present

## 2021-07-11 DIAGNOSIS — R110 Nausea: Secondary | ICD-10-CM | POA: Diagnosis not present

## 2021-07-11 DIAGNOSIS — R5381 Other malaise: Secondary | ICD-10-CM | POA: Diagnosis not present

## 2021-07-11 DIAGNOSIS — R609 Edema, unspecified: Secondary | ICD-10-CM | POA: Diagnosis not present

## 2021-07-11 DIAGNOSIS — N390 Urinary tract infection, site not specified: Secondary | ICD-10-CM | POA: Diagnosis not present

## 2021-07-11 DIAGNOSIS — B372 Candidiasis of skin and nail: Secondary | ICD-10-CM | POA: Diagnosis not present

## 2021-07-14 DIAGNOSIS — G35 Multiple sclerosis: Secondary | ICD-10-CM | POA: Diagnosis not present

## 2021-07-14 DIAGNOSIS — F419 Anxiety disorder, unspecified: Secondary | ICD-10-CM | POA: Diagnosis not present

## 2021-07-14 DIAGNOSIS — M6281 Muscle weakness (generalized): Secondary | ICD-10-CM | POA: Diagnosis not present

## 2021-07-16 DIAGNOSIS — Z7901 Long term (current) use of anticoagulants: Secondary | ICD-10-CM | POA: Diagnosis not present

## 2021-07-18 DIAGNOSIS — M6281 Muscle weakness (generalized): Secondary | ICD-10-CM | POA: Diagnosis not present

## 2021-07-18 DIAGNOSIS — G35 Multiple sclerosis: Secondary | ICD-10-CM | POA: Diagnosis not present

## 2021-07-18 DIAGNOSIS — F419 Anxiety disorder, unspecified: Secondary | ICD-10-CM | POA: Diagnosis not present

## 2021-07-19 DIAGNOSIS — F419 Anxiety disorder, unspecified: Secondary | ICD-10-CM | POA: Diagnosis not present

## 2021-07-19 DIAGNOSIS — M6281 Muscle weakness (generalized): Secondary | ICD-10-CM | POA: Diagnosis not present

## 2021-07-19 DIAGNOSIS — G35 Multiple sclerosis: Secondary | ICD-10-CM | POA: Diagnosis not present

## 2021-07-20 DIAGNOSIS — G35 Multiple sclerosis: Secondary | ICD-10-CM | POA: Diagnosis not present

## 2021-07-20 DIAGNOSIS — M6281 Muscle weakness (generalized): Secondary | ICD-10-CM | POA: Diagnosis not present

## 2021-07-20 DIAGNOSIS — F419 Anxiety disorder, unspecified: Secondary | ICD-10-CM | POA: Diagnosis not present

## 2021-07-21 DIAGNOSIS — M6281 Muscle weakness (generalized): Secondary | ICD-10-CM | POA: Diagnosis not present

## 2021-07-21 DIAGNOSIS — Z79899 Other long term (current) drug therapy: Secondary | ICD-10-CM | POA: Diagnosis not present

## 2021-07-21 DIAGNOSIS — N390 Urinary tract infection, site not specified: Secondary | ICD-10-CM | POA: Diagnosis not present

## 2021-07-21 DIAGNOSIS — E559 Vitamin D deficiency, unspecified: Secondary | ICD-10-CM | POA: Diagnosis not present

## 2021-07-21 DIAGNOSIS — B372 Candidiasis of skin and nail: Secondary | ICD-10-CM | POA: Diagnosis not present

## 2021-07-21 DIAGNOSIS — J3489 Other specified disorders of nose and nasal sinuses: Secondary | ICD-10-CM | POA: Diagnosis not present

## 2021-07-21 DIAGNOSIS — I509 Heart failure, unspecified: Secondary | ICD-10-CM | POA: Diagnosis not present

## 2021-07-21 DIAGNOSIS — G35 Multiple sclerosis: Secondary | ICD-10-CM | POA: Diagnosis not present

## 2021-07-21 DIAGNOSIS — R609 Edema, unspecified: Secondary | ICD-10-CM | POA: Diagnosis not present

## 2021-07-21 DIAGNOSIS — G4733 Obstructive sleep apnea (adult) (pediatric): Secondary | ICD-10-CM | POA: Diagnosis not present

## 2021-07-21 DIAGNOSIS — K219 Gastro-esophageal reflux disease without esophagitis: Secondary | ICD-10-CM | POA: Diagnosis not present

## 2021-07-21 DIAGNOSIS — L258 Unspecified contact dermatitis due to other agents: Secondary | ICD-10-CM | POA: Diagnosis not present

## 2021-07-21 DIAGNOSIS — F419 Anxiety disorder, unspecified: Secondary | ICD-10-CM | POA: Diagnosis not present

## 2021-07-21 DIAGNOSIS — K5900 Constipation, unspecified: Secondary | ICD-10-CM | POA: Diagnosis not present

## 2021-07-22 DIAGNOSIS — M6281 Muscle weakness (generalized): Secondary | ICD-10-CM | POA: Diagnosis not present

## 2021-07-22 DIAGNOSIS — G35 Multiple sclerosis: Secondary | ICD-10-CM | POA: Diagnosis not present

## 2021-07-22 DIAGNOSIS — F419 Anxiety disorder, unspecified: Secondary | ICD-10-CM | POA: Diagnosis not present

## 2021-07-22 DIAGNOSIS — Z7901 Long term (current) use of anticoagulants: Secondary | ICD-10-CM | POA: Diagnosis not present

## 2021-07-23 DIAGNOSIS — I4891 Unspecified atrial fibrillation: Secondary | ICD-10-CM | POA: Diagnosis not present

## 2021-07-23 DIAGNOSIS — E039 Hypothyroidism, unspecified: Secondary | ICD-10-CM | POA: Diagnosis not present

## 2021-07-23 DIAGNOSIS — E785 Hyperlipidemia, unspecified: Secondary | ICD-10-CM | POA: Diagnosis not present

## 2021-07-23 DIAGNOSIS — M1990 Unspecified osteoarthritis, unspecified site: Secondary | ICD-10-CM | POA: Diagnosis not present

## 2021-07-23 DIAGNOSIS — I509 Heart failure, unspecified: Secondary | ICD-10-CM | POA: Diagnosis not present

## 2021-07-23 DIAGNOSIS — E559 Vitamin D deficiency, unspecified: Secondary | ICD-10-CM | POA: Diagnosis not present

## 2021-07-25 DIAGNOSIS — F419 Anxiety disorder, unspecified: Secondary | ICD-10-CM | POA: Diagnosis not present

## 2021-07-25 DIAGNOSIS — G35 Multiple sclerosis: Secondary | ICD-10-CM | POA: Diagnosis not present

## 2021-07-25 DIAGNOSIS — M6281 Muscle weakness (generalized): Secondary | ICD-10-CM | POA: Diagnosis not present

## 2021-07-26 ENCOUNTER — Telehealth: Payer: Self-pay | Admitting: *Deleted

## 2021-07-26 ENCOUNTER — Encounter: Payer: Medicare Other | Admitting: Neurology

## 2021-07-26 DIAGNOSIS — M6281 Muscle weakness (generalized): Secondary | ICD-10-CM | POA: Diagnosis not present

## 2021-07-26 DIAGNOSIS — F419 Anxiety disorder, unspecified: Secondary | ICD-10-CM | POA: Diagnosis not present

## 2021-07-26 DIAGNOSIS — G35 Multiple sclerosis: Secondary | ICD-10-CM | POA: Diagnosis not present

## 2021-07-26 NOTE — Telephone Encounter (Signed)
Spoke with the patient and rescheduled her appt from 12/16 to 12/30

## 2021-07-27 DIAGNOSIS — G35 Multiple sclerosis: Secondary | ICD-10-CM | POA: Diagnosis not present

## 2021-07-27 DIAGNOSIS — F419 Anxiety disorder, unspecified: Secondary | ICD-10-CM | POA: Diagnosis not present

## 2021-07-27 DIAGNOSIS — M6281 Muscle weakness (generalized): Secondary | ICD-10-CM | POA: Diagnosis not present

## 2021-07-28 DIAGNOSIS — M6281 Muscle weakness (generalized): Secondary | ICD-10-CM | POA: Diagnosis not present

## 2021-07-28 DIAGNOSIS — G35 Multiple sclerosis: Secondary | ICD-10-CM | POA: Diagnosis not present

## 2021-07-28 DIAGNOSIS — F419 Anxiety disorder, unspecified: Secondary | ICD-10-CM | POA: Diagnosis not present

## 2021-07-29 DIAGNOSIS — F419 Anxiety disorder, unspecified: Secondary | ICD-10-CM | POA: Diagnosis not present

## 2021-07-29 DIAGNOSIS — I4891 Unspecified atrial fibrillation: Secondary | ICD-10-CM | POA: Diagnosis not present

## 2021-07-29 DIAGNOSIS — G35 Multiple sclerosis: Secondary | ICD-10-CM | POA: Diagnosis not present

## 2021-07-29 DIAGNOSIS — M6281 Muscle weakness (generalized): Secondary | ICD-10-CM | POA: Diagnosis not present

## 2021-08-01 DIAGNOSIS — F419 Anxiety disorder, unspecified: Secondary | ICD-10-CM | POA: Diagnosis not present

## 2021-08-01 DIAGNOSIS — G629 Polyneuropathy, unspecified: Secondary | ICD-10-CM | POA: Diagnosis not present

## 2021-08-01 DIAGNOSIS — R5381 Other malaise: Secondary | ICD-10-CM | POA: Diagnosis not present

## 2021-08-01 DIAGNOSIS — M6281 Muscle weakness (generalized): Secondary | ICD-10-CM | POA: Diagnosis not present

## 2021-08-01 DIAGNOSIS — G4733 Obstructive sleep apnea (adult) (pediatric): Secondary | ICD-10-CM | POA: Diagnosis not present

## 2021-08-01 DIAGNOSIS — N390 Urinary tract infection, site not specified: Secondary | ICD-10-CM | POA: Diagnosis not present

## 2021-08-01 DIAGNOSIS — K5900 Constipation, unspecified: Secondary | ICD-10-CM | POA: Diagnosis not present

## 2021-08-01 DIAGNOSIS — M1990 Unspecified osteoarthritis, unspecified site: Secondary | ICD-10-CM | POA: Diagnosis not present

## 2021-08-01 DIAGNOSIS — G8929 Other chronic pain: Secondary | ICD-10-CM | POA: Diagnosis not present

## 2021-08-01 DIAGNOSIS — G35 Multiple sclerosis: Secondary | ICD-10-CM | POA: Diagnosis not present

## 2021-08-01 DIAGNOSIS — I4891 Unspecified atrial fibrillation: Secondary | ICD-10-CM | POA: Diagnosis not present

## 2021-08-01 DIAGNOSIS — R110 Nausea: Secondary | ICD-10-CM | POA: Diagnosis not present

## 2021-08-01 DIAGNOSIS — N3289 Other specified disorders of bladder: Secondary | ICD-10-CM | POA: Diagnosis not present

## 2021-08-01 DIAGNOSIS — E559 Vitamin D deficiency, unspecified: Secondary | ICD-10-CM | POA: Diagnosis not present

## 2021-08-02 DIAGNOSIS — M6281 Muscle weakness (generalized): Secondary | ICD-10-CM | POA: Diagnosis not present

## 2021-08-02 DIAGNOSIS — F419 Anxiety disorder, unspecified: Secondary | ICD-10-CM | POA: Diagnosis not present

## 2021-08-02 DIAGNOSIS — I5032 Chronic diastolic (congestive) heart failure: Secondary | ICD-10-CM | POA: Diagnosis not present

## 2021-08-02 DIAGNOSIS — G35 Multiple sclerosis: Secondary | ICD-10-CM | POA: Diagnosis not present

## 2021-08-03 DIAGNOSIS — F419 Anxiety disorder, unspecified: Secondary | ICD-10-CM | POA: Diagnosis not present

## 2021-08-03 DIAGNOSIS — G35 Multiple sclerosis: Secondary | ICD-10-CM | POA: Diagnosis not present

## 2021-08-03 DIAGNOSIS — M6281 Muscle weakness (generalized): Secondary | ICD-10-CM | POA: Diagnosis not present

## 2021-08-04 DIAGNOSIS — F419 Anxiety disorder, unspecified: Secondary | ICD-10-CM | POA: Diagnosis not present

## 2021-08-04 DIAGNOSIS — G35 Multiple sclerosis: Secondary | ICD-10-CM | POA: Diagnosis not present

## 2021-08-04 DIAGNOSIS — M6281 Muscle weakness (generalized): Secondary | ICD-10-CM | POA: Diagnosis not present

## 2021-08-05 ENCOUNTER — Ambulatory Visit: Payer: Medicare Other | Admitting: Gynecologic Oncology

## 2021-08-05 DIAGNOSIS — Z7901 Long term (current) use of anticoagulants: Secondary | ICD-10-CM | POA: Diagnosis not present

## 2021-08-05 DIAGNOSIS — M6281 Muscle weakness (generalized): Secondary | ICD-10-CM | POA: Diagnosis not present

## 2021-08-05 DIAGNOSIS — G35 Multiple sclerosis: Secondary | ICD-10-CM | POA: Diagnosis not present

## 2021-08-05 DIAGNOSIS — F419 Anxiety disorder, unspecified: Secondary | ICD-10-CM | POA: Diagnosis not present

## 2021-08-07 DIAGNOSIS — F419 Anxiety disorder, unspecified: Secondary | ICD-10-CM | POA: Diagnosis not present

## 2021-08-07 DIAGNOSIS — G35 Multiple sclerosis: Secondary | ICD-10-CM | POA: Diagnosis not present

## 2021-08-07 DIAGNOSIS — M6281 Muscle weakness (generalized): Secondary | ICD-10-CM | POA: Diagnosis not present

## 2021-08-08 DIAGNOSIS — R002 Palpitations: Secondary | ICD-10-CM | POA: Diagnosis not present

## 2021-08-08 DIAGNOSIS — R609 Edema, unspecified: Secondary | ICD-10-CM | POA: Diagnosis not present

## 2021-08-08 DIAGNOSIS — G35 Multiple sclerosis: Secondary | ICD-10-CM | POA: Diagnosis not present

## 2021-08-08 DIAGNOSIS — K5900 Constipation, unspecified: Secondary | ICD-10-CM | POA: Diagnosis not present

## 2021-08-08 DIAGNOSIS — F419 Anxiety disorder, unspecified: Secondary | ICD-10-CM | POA: Diagnosis not present

## 2021-08-08 DIAGNOSIS — L258 Unspecified contact dermatitis due to other agents: Secondary | ICD-10-CM | POA: Diagnosis not present

## 2021-08-08 DIAGNOSIS — G8929 Other chronic pain: Secondary | ICD-10-CM | POA: Diagnosis not present

## 2021-08-08 DIAGNOSIS — F418 Other specified anxiety disorders: Secondary | ICD-10-CM | POA: Diagnosis not present

## 2021-08-08 DIAGNOSIS — E039 Hypothyroidism, unspecified: Secondary | ICD-10-CM | POA: Diagnosis not present

## 2021-08-08 DIAGNOSIS — B372 Candidiasis of skin and nail: Secondary | ICD-10-CM | POA: Diagnosis not present

## 2021-08-08 DIAGNOSIS — G4733 Obstructive sleep apnea (adult) (pediatric): Secondary | ICD-10-CM | POA: Diagnosis not present

## 2021-08-08 DIAGNOSIS — R110 Nausea: Secondary | ICD-10-CM | POA: Diagnosis not present

## 2021-08-08 DIAGNOSIS — N390 Urinary tract infection, site not specified: Secondary | ICD-10-CM | POA: Diagnosis not present

## 2021-08-08 DIAGNOSIS — M6281 Muscle weakness (generalized): Secondary | ICD-10-CM | POA: Diagnosis not present

## 2021-08-09 DIAGNOSIS — G35 Multiple sclerosis: Secondary | ICD-10-CM | POA: Diagnosis not present

## 2021-08-09 DIAGNOSIS — F419 Anxiety disorder, unspecified: Secondary | ICD-10-CM | POA: Diagnosis not present

## 2021-08-09 DIAGNOSIS — M6281 Muscle weakness (generalized): Secondary | ICD-10-CM | POA: Diagnosis not present

## 2021-08-09 DIAGNOSIS — I110 Hypertensive heart disease with heart failure: Secondary | ICD-10-CM | POA: Diagnosis not present

## 2021-08-11 DIAGNOSIS — M6281 Muscle weakness (generalized): Secondary | ICD-10-CM | POA: Diagnosis not present

## 2021-08-11 DIAGNOSIS — F419 Anxiety disorder, unspecified: Secondary | ICD-10-CM | POA: Diagnosis not present

## 2021-08-11 DIAGNOSIS — G35 Multiple sclerosis: Secondary | ICD-10-CM | POA: Diagnosis not present

## 2021-08-12 DIAGNOSIS — N390 Urinary tract infection, site not specified: Secondary | ICD-10-CM | POA: Diagnosis not present

## 2021-08-12 DIAGNOSIS — R300 Dysuria: Secondary | ICD-10-CM | POA: Diagnosis not present

## 2021-08-12 DIAGNOSIS — Z7901 Long term (current) use of anticoagulants: Secondary | ICD-10-CM | POA: Diagnosis not present

## 2021-08-16 ENCOUNTER — Telehealth: Payer: Self-pay

## 2021-08-16 DIAGNOSIS — M6281 Muscle weakness (generalized): Secondary | ICD-10-CM | POA: Diagnosis not present

## 2021-08-16 DIAGNOSIS — G35 Multiple sclerosis: Secondary | ICD-10-CM | POA: Diagnosis not present

## 2021-08-16 DIAGNOSIS — F419 Anxiety disorder, unspecified: Secondary | ICD-10-CM | POA: Diagnosis not present

## 2021-08-16 NOTE — Telephone Encounter (Signed)
Received incoming fax from [facility] regarding U/A culture report and current antibiotic medication prescribed by SNF team. Report placed in MD box for FYI. Per SNF patient's daughter asked for reports to be faxed to [facility]. With patient being treated by SNF no appointments needed at this time.  Eugenia Mcalpine

## 2021-08-17 DIAGNOSIS — M6281 Muscle weakness (generalized): Secondary | ICD-10-CM | POA: Diagnosis not present

## 2021-08-17 DIAGNOSIS — G35 Multiple sclerosis: Secondary | ICD-10-CM | POA: Diagnosis not present

## 2021-08-17 DIAGNOSIS — F419 Anxiety disorder, unspecified: Secondary | ICD-10-CM | POA: Diagnosis not present

## 2021-08-18 ENCOUNTER — Ambulatory Visit (INDEPENDENT_AMBULATORY_CARE_PROVIDER_SITE_OTHER): Payer: Medicare Other | Admitting: Neurology

## 2021-08-18 ENCOUNTER — Encounter: Payer: Self-pay | Admitting: Neurology

## 2021-08-18 VITALS — BP 102/58 | HR 65 | Ht 62.0 in | Wt 189.0 lb

## 2021-08-18 DIAGNOSIS — N390 Urinary tract infection, site not specified: Secondary | ICD-10-CM | POA: Diagnosis not present

## 2021-08-18 DIAGNOSIS — F419 Anxiety disorder, unspecified: Secondary | ICD-10-CM | POA: Diagnosis not present

## 2021-08-18 DIAGNOSIS — G35 Multiple sclerosis: Secondary | ICD-10-CM | POA: Diagnosis not present

## 2021-08-18 DIAGNOSIS — R252 Cramp and spasm: Secondary | ICD-10-CM | POA: Diagnosis not present

## 2021-08-18 DIAGNOSIS — E785 Hyperlipidemia, unspecified: Secondary | ICD-10-CM | POA: Diagnosis not present

## 2021-08-18 DIAGNOSIS — I4891 Unspecified atrial fibrillation: Secondary | ICD-10-CM | POA: Diagnosis not present

## 2021-08-18 DIAGNOSIS — Z978 Presence of other specified devices: Secondary | ICD-10-CM

## 2021-08-18 DIAGNOSIS — K219 Gastro-esophageal reflux disease without esophagitis: Secondary | ICD-10-CM | POA: Diagnosis not present

## 2021-08-18 DIAGNOSIS — M6281 Muscle weakness (generalized): Secondary | ICD-10-CM | POA: Diagnosis not present

## 2021-08-18 NOTE — Patient Instructions (Addendum)
Continue baclofen pump at current settings.  Continue Cipro continuous rate 789.3 mcg/day.  She will return in about 3 months for her next pump refill.  She will return in about 3 months for her next pump refill.  Return in 3 months for next pump refill

## 2021-08-18 NOTE — Progress Notes (Signed)
RM 12 , with family. Skin prepped with Betadine and draped in sterile fashion. Center port of implanted pump accessed using 22g huber needle.  Residual fluid measuring 4.1 ml was removed from the reservoir.  Gablofen 2074mcg/mlx2 instilled into pump. Pump reprogrammed to reflect a volume of 75ml.  ERI is 10/2027 Next appt given for 11/14/2021. Alarm date: 11/22/2021.

## 2021-08-18 NOTE — Progress Notes (Signed)
[facility]  PATIENT: Toni [name] DOB: [birth year]  REFERRING DOCTOR OR PCP: Dr. Brett Fairy SOURCE: Patient, notes from Dr. Brett Fairy and Dr. [name]  _________________________________   HISTORICAL  CHIEF COMPLAINT:  Chief Complaint  Patient presents with   Procedure    Rm 2, w husband and caregiver Rose. Here for baclofen pump refill. Pt is in reclining wheelchair. She is able to stay in wheelchair and recline to do pump refill.    HISTORY OF PRESENT ILLNESS:  Toni [name] is a 75 year old woman with multiple sclerosis.  She is followed primarily by Dr. Brett Fairy in the office.  She was seeing Dr. [name] for baclofen pump management.  However, upon his retirement I can take this over.  Her spasticity is better with the baclofen pump (she is on third pump - first one 2009, last one July 2022).   She will occasionally take a baclofen pill but rarely needs to do so.   She is happy with the current pump settings and does not feel she needs to make any adjustment.  She is unable to walk but assist some with transfers.  She is able to move her legs in bed and to go on her side in bed.  She also notes weakness in her hands.  She has mild dysesthesias in her legs.  Gabapentin has helped.     She has difficulty with a neurogenic bladder and has frequent UTIs.  Spasticity is worse when she is sick.  Vision is doing well.  REVIEW OF SYSTEMS: Constitutional: No fevers, chills, sweats, or change in appetite Eyes: No visual changes, double vision, eye pain Ear, nose and throat: No hearing loss, ear pain, nasal congestion, sore throat Cardiovascular: No chest pain, palpitations Respiratory:  No shortness of breath at rest or with exertion.   No wheezes GastrointestinaI: No nausea, vomiting, diarrhea, abdominal pain, fecal incontinence Genitourinary:  No dysuria, urinary retention or frequency.  No nocturia. Musculoskeletal:  No neck pain, back pain Integumentary: No  rash, pruritus, skin lesions Neurological: as above Psychiatric: No depression at this time.  No anxiety Endocrine: No palpitations, diaphoresis, change in appetite, change in weigh or increased thirst Hematologic/Lymphatic:  No anemia, purpura, petechiae. Allergic/Immunologic: No itchy/runny eyes, nasal congestion, recent allergic reactions, rashes  ALLERGIES: Allergies  Allergen Reactions   Other Rash    Topical Surgical prep  - severe rash   Chlorhexidine Hives and Other (See Comments)    And, sores appear where applied   Hydrocodone Nausea And Vomiting and Other (See Comments)    Hydrocodone causes vomiting Hydrocodone causes vomiting   Oxycodone Nausea And Vomiting and Other (See Comments)    Oral oxycodone causes vomiting   Sertraline Hives, Swelling, Rash and Other (See Comments)    [DERM][OTHER]RASH WITH SWELLING   Phenol Hives    HOME MEDICATIONS:  Current Outpatient Medications:    acetaminophen (TYLENOL) 500 MG tablet, Take 1,000 mg by mouth every 6 (six) hours as needed (fever/headache/pain)., Disp: , Rfl:    ALPRAZolam (XANAX) 0.5 MG tablet, TAKE 1 TABLET BY MOUTH 3  TIMES DAILY AS NEEDED FOR  ANXIETY AND SLEEP (Patient taking differently: Take 0.5 mg by mouth 3 (three) times daily as needed for anxiety or sleep.), Disp: 90 tablet, Rfl: 1   ARTIFICIAL TEARS 0.2-0.2-1 % SOLN, Apply to eye., Disp: , Rfl:    baclofen (LIORESAL) 20 MG tablet, Take 1 tablet (20 mg total) by mouth 4 (four) times daily as needed for muscle spasms. (Patient taking  differently: Take 20 mg by mouth in the morning, at noon, in the evening, and at bedtime. Hold for lethargy), Disp: 30 each, Rfl: 0   Biotin 10000 MCG TABS, Take 10,000 mcg by mouth daily after lunch., Disp: , Rfl:    bisacodyl (DULCOLAX) 10 MG suppository, Place 10 mg rectally daily as needed for moderate constipation., Disp: , Rfl:    buPROPion (WELLBUTRIN XL) 150 MG 24 hr tablet, Take 1 tablet (150 mg total) by mouth daily. (Patient  taking differently: Take 150 mg by mouth daily. After breakfest), Disp: 90 tablet, Rfl: 1   Calcium Carbonate-Vitamin D3 600-400 MG-UNIT TABS, Take 1 tablet by mouth in the morning and at bedtime. (After breakfast & after lunch), Disp: , Rfl:    cetirizine (ZYRTEC) 10 MG tablet, Take 10 mg by mouth daily as needed for allergies. , Disp: , Rfl:    Cholecalciferol (VITAMIN D3) 50 MCG (2000 UT) TABS, Take 2,000 Units by mouth in the morning and at bedtime. (After breakfast & after lunch), Disp: , Rfl:    Coenzyme Q10 (COQ10 PO), Take by mouth daily in the afternoon., Disp: , Rfl:    diclofenac Sodium (VOLTAREN) 1 % GEL, Apply 2 g topically 3 (three) times daily as needed. (Patient taking differently: Apply 2 g topically 3 (three) times daily as needed (pain (applied to bilateral shoulders)).), Disp: 100 g, Rfl: 3   diltiazem (DILACOR XR) 240 MG 24 hr capsule, Take 240 mg by mouth daily after breakfast., Disp: , Rfl:    docusate sodium (COLACE) 100 MG capsule, Take 1 capsule (100 mg total) by mouth daily. (Patient taking differently: Take 100 mg by mouth 2 (two) times daily.), Disp: 10 capsule, Rfl: 0   escitalopram (LEXAPRO) 10 MG tablet, Take 1 tablet (10 mg total) by mouth daily. (Patient taking differently: Take 10 mg by mouth at bedtime.), Disp: 90 tablet, Rfl: 1   estradiol (ESTRACE) 0.1 MG/GM vaginal cream, Place vaginally as directed. On Wednesday and saturday, Disp: , Rfl:    famotidine (PEPCID) 20 MG tablet, Take 20 mg by mouth daily., Disp: , Rfl:    furosemide (LASIX) 20 MG tablet, Take 2 tablets (40 mg total) by mouth in the morning, at noon, and at bedtime. (Patient taking differently: Take 60 mg by mouth 2 (two) times daily.), Disp: 30 tablet, Rfl: 0   gabapentin (NEURONTIN) 300 MG capsule, Mrs. Olivos will take 300 mg of gabapentin at 6 AM, 10 AM, 2 PM and 6 PM and 600 mg at 10 PM by mouth., Disp: 180 capsule, Rfl: 11   lactobacillus acidophilus (BACID) TABS tablet, Take 1 tablet by mouth 2  (two) times daily as needed., Disp: , Rfl:    levothyroxine (SYNTHROID, LEVOTHROID) 75 MCG tablet, Take 75 mcg by mouth daily before breakfast. (0500), Disp: , Rfl:    linaclotide (LINZESS) 145 MCG CAPS capsule, Take 1 capsule (145 mcg total) by mouth daily before breakfast. (Patient taking differently: Take 145 mcg by mouth daily before breakfast. (0500)), Disp: 90 capsule, Rfl: 3   melatonin 5 MG TABS, Take 5 mg by mouth at bedtime., Disp: , Rfl:    methenamine (HIPREX) 1 g tablet, Take 1 g by mouth 2 (two) times daily with a meal., Disp: , Rfl:    Multiple Vitamin (MULTIVITAMIN WITH MINERALS) TABS tablet, Take 1 tablet by mouth daily after lunch., Disp: , Rfl:    neomycin-bacitracin-polymyxin (NEOSPORIN) ointment, Apply 1 application topically 2 (two) times daily as needed for wound care (applied  to nose (wear mask rubs))., Disp: , Rfl:    nitrofurantoin, macrocrystal-monohydrate, (MACROBID) 100 MG capsule, Take 100 mg by mouth 2 (two) times daily., Disp: , Rfl:    NYAMYC powder, Apply topically., Disp: , Rfl:    omeprazole (PRILOSEC) 20 MG capsule, Take 20 mg by mouth daily before breakfast. (0500), Disp: , Rfl:    ondansetron (ZOFRAN) 8 MG tablet, Take 8 mg by mouth daily as needed for nausea or vomiting., Disp: , Rfl:    oxybutynin (DITROPAN) 5 MG tablet, Take 5 mg by mouth 3 (three) times daily as needed for bladder spasms. , Disp: , Rfl:    potassium chloride SA (KLOR-CON) 20 MEQ tablet, Take 2 tablets (40 mEq total) by mouth daily. (Patient taking differently: Take 20 mEq by mouth 2 (two) times daily. (After lunch & after dinner)), Disp: 180 tablet, Rfl: 3   psyllium (METAMUCIL SMOOTH TEXTURE) 28 % packet, Take 1 packet by mouth at bedtime., Disp: , Rfl:    simvastatin (ZOCOR) 10 MG tablet, Take 10 mg by mouth at bedtime., Disp: , Rfl:    SYRINGE/NEEDLE, DISP, 1 ML (BD LUER-LOK SYRINGE) 25G X 5/8" 1 ML MISC, Use as directed to inject  Acthar, Disp: 10 each, Rfl: prn   warfarin (COUMADIN) 4  MG tablet, Take 4 mg by mouth daily., Disp: , Rfl:    zinc oxide 20 % ointment, Apply 1 application topically as needed for irritation (apply labia every shift as needed for redness)., Disp: , Rfl:    diltiazem (CARDIZEM CD) 240 MG 24 hr capsule, Take 240 mg by mouth daily., Disp: , Rfl:    Menthol-Zinc Oxide (REMEDY CALAZIME) 0.4-20.5 % PSTE, Apply 1 application topically 3 (three) times daily as needed (buttocks/sacrum with incontinences care episodes)., Disp: , Rfl:    midodrine (PROAMATINE) 5 MG tablet, Take 1 tablet (5 mg total) by mouth 3 (three) times daily with meals. (Patient taking differently: Take 5 mg by mouth as needed. For low BP), Disp: 270 tablet, Rfl: 3   NON FORMULARY, ASV breathing machine is to be used while napping & at bedtime. Fill canister up to max line with distilled water and turn concentrator to 5LPM., Disp: , Rfl:    traMADol (ULTRAM) 50 MG tablet, Take 1 tablet (50 mg total) by mouth every 6 (six) hours as needed for moderate pain., Disp: 28 tablet, Rfl: 0  PAST MEDICAL HISTORY: Past Medical History:  Diagnosis Date   Abnormality of gait 03/01/2015   Anxiety    Arthritis    "knees" (01/19/2016)   Asthma    as a child    Back pain    Bilateral carpal tunnel syndrome 10/04/2020   Cancer ([place])    / RENAL CELL CARCINOMA - GETS CT SCAN EVERY 6 MONTHS TO WATCH    CHF (congestive heart failure) (HCC)    Chronic pain    "nerve pain from the MS"   Complex atypical endometrial hyperplasia    Constipation    Depression    DVT (deep venous thrombosis) ([place]) 1983   "RLE; may have just been phlebitis; it was before the age of dopplers"   Dysrhythmia    afib    Edema    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' ablation of R GSV 2012   GERD (gastroesophageal reflux disease)    HLD (hyperlipidemia)    Hypertension    Hypotension    Hypothyroidism    Joint pain    Multiple  sclerosis ([place])    has had this may 1989-[place] reg doc   OSA on CPAP     bipap machine - SLEEP APNEA WORSE SINCE COVID IN 1/21 PER PATIENT SETTING HAS INCREASED FROM 9 TO 30    Osteoarthritis    PAF (paroxysmal atrial fibrillation) ([place])    on coumadin; documented on monitor 06/2010 - no longer takes per Kym Groom 02/09/21   Pneumonia 11/2011   Sleep apnea    uses sleep apnea machine    PAST SURGICAL HISTORY: Past Surgical History:  Procedure Laterality Date   ANTERIOR CERVICAL DECOMP/DISCECTOMY FUSION  01/2004   CARDIAC CATHETERIZATION  06/22/2010   normal coronary arteries, PAF   CATARACT EXTRACTION, BILATERAL Bilateral    CHILD BIRTHS     X2   DILATATION & CURETTAGE/HYSTEROSCOPY WITH MYOSURE N/A 01/15/2020   Procedure: DILATATION & CURETTAGE/HYSTEROSCOPY WITH MYOSURE;  Surgeon: Arvella Nigh, MD;  Location: [facility];  Service: Gynecology;  Laterality: N/A;  pt in wheelchair-has MS   DILATION AND CURETTAGE OF UTERUS     DILATION AND CURETTAGE OF UTERUS N/A 02/16/2020   Procedure: DILATATION AND CURETTAGE;  Surgeon: Lafonda Mosses, MD;  Location: [facility];  Service: Gynecology;  Laterality: N/A;  NOT GENERAL -NO INTUBATION   INFUSION PUMP IMPLANTATION  ~ 2009   baclofen infusion in lower abd   INTRATHECAL PUMP REVISION N/A 02/18/2021   Procedure: Baclofen Pump replacement with possible revision of catheter;  Surgeon: Erline Levine, MD;  Location: [facility];  Service: Neurosurgery;  Laterality: N/A;  3C/RM 20   INTRAUTERINE DEVICE (IUD) INSERTION N/A 02/16/2020   Procedure: INTRAUTERINE DEVICE (IUD) INSERTION - MIRENA;  Surgeon: Lafonda Mosses, MD;  Location: [facility];  Service: Gynecology;  Laterality: N/A;   PAIN PUMP REVISION N/A 07/29/2014   Procedure: Baclofen pump replacement;  Surgeon: Erline Levine, MD;  Location: [facility];  Service: Neurosurgery;  Laterality: N/A;  Baclofen pump replacement   TONSILLECTOMY AND ADENOIDECTOMY     VARICOSE VEIN SURGERY  ~ 1968   VENOUS ABLATION  12/16/2010   radiofreq ablation -Dr Elisabeth Cara and [place]   WISDOM TOOTH  EXTRACTION      FAMILY HISTORY: Family History  Problem Relation Age of Onset   Coronary artery disease Father        at age 68   Hyperlipidemia Father    Thyroid disease Father    Coronary artery disease Maternal Grandmother    Depression Maternal Grandmother    Cancer Paternal Grandmother        breast   Depression Mother    Sudden death Mother    Anxiety disorder Mother    Alcoholism Son     SOCIAL HISTORY:  Social History   Socioeconomic History   Marital status: Married    Spouse name: [name]   Number of children: 2   Years of education: COLLEGE   Highest education level: Not on file  Occupational History   Occupation: RETIRED  Tobacco Use   Smoking status: Former    Packs/day: 0.50    Years: 7.00    Pack years: 3.50    Types: Cigarettes    Quit date: 08/21/1976    Years since quitting: 45.0   Smokeless tobacco: Never  Vaping Use   Vaping Use: Never used  Substance and Sexual Activity   Alcohol use: Not Currently   Drug use: No   Sexual activity: Not Currently    Birth control/protection: Post-menopausal  Other Topics Concern   Not on  file  Social History Narrative   Lives in [place] with Husband [name] [phone], [phone]   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   General: The patient is well-developed and well-nourished and in no acute distress  HEENT:  Head is [place]/AT.    Neurologic Exam  Mental status: The patient is alert and oriented x 3 at the time of the  examination. The patient has apparent normal recent and remote memory, with an apparently normal attention span and concentration ability.   .  Cranial nerves: Extraocular movements are full.  Facial strength was normal  Motor:  Muscle bulk reduced at the thenar eminences.   She has mild increase muscle tone in the legs and hands.  She has reduced grip bilaterally and 3/5 strength in the intrinsic hand muscles.  Strength is 2+/5 in the legs  Sensory: Sensory testing is intact to touch x4.\  Gait and station: She is unable to stand or walk.   Reflexes: Deep tendon reflexes are symmetric and normal bilaterally.   Plantar responses are flexor.    DIAGNOSTIC DATA (LABS, IMAGING, TESTING) - I reviewed patient records, labs, notes, testing and imaging myself where available.  Lab Results  Component Value Date   WBC 5.9 02/18/2021   HGB 14.3 02/18/2021   HCT 42.7 02/18/2021   MCV 93.8 02/18/2021   PLT 176 02/18/2021      Component Value Date/Time   NA 136 02/18/2021 1047   NA 144 12/15/2020 1100   K 3.9 02/18/2021 1047   CL 95 (L) 02/18/2021 1047   CO2 33 (H) 02/18/2021 1047   GLUCOSE 87 02/18/2021 1047   BUN 19 02/18/2021 1047   BUN 27 12/15/2020 1100   CREATININE 0.54 02/18/2021 1047   CALCIUM 9.1 02/18/2021 1047   PROT 5.9 (L) 01/12/2021 0331   PROT 5.9 (L) 09/20/2020 1436   ALBUMIN 3.5 01/12/2021 0331   ALBUMIN 3.9 09/20/2020 1436   AST 17 01/12/2021 0331   ALT 15 01/12/2021 0331   ALKPHOS 56 01/12/2021 0331   BILITOT 0.4 01/12/2021 0331   BILITOT 0.3 09/20/2020 1436   GFRNONAA >60 02/18/2021 1047   GFRAA 90 09/20/2020 1436   Lab Results  Component Value Date   CHOL 111 03/19/2019   HDL 57 03/19/2019   LDLCALC 43 03/19/2019   TRIG 53 03/19/2019   CHOLHDL 3.0 04/22/2010   Lab Results  Component Value Date   HGBA1C 4.9 06/05/2020   No results found for: VITAMINB12 Lab Results  Component Value Date   TSH 2.961 01/03/2021       ASSESSMENT AND  PLAN  Multiple sclerosis, 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HCC)  Presence of intrathecal pump  Spasticity   The pump was refilled. Continue simple continuous rate of 789.3 mcg/day.  Return in 3 months for next pump refill.   F/u with Dr. Brett Fairy for other [place] / neuro issues  Jontae Adebayo A.  Felecia Shelling, MD, [facility] 47/18/5501, 5:86 PM Certified in Neurology, Clinical Neurophysiology, Sleep Medicine and Neuroimaging  [facility] [address] [place], [place] [address] [phone]

## 2021-08-19 ENCOUNTER — Telehealth: Payer: Self-pay

## 2021-08-19 ENCOUNTER — Inpatient Hospital Stay: Payer: Medicare Other | Attending: Gynecologic Oncology | Admitting: Gynecologic Oncology

## 2021-08-19 ENCOUNTER — Encounter: Payer: Self-pay | Admitting: Gynecologic Oncology

## 2021-08-19 ENCOUNTER — Other Ambulatory Visit: Payer: Self-pay

## 2021-08-19 VITALS — BP 109/53 | HR 90 | Temp 98.4°F | Resp 18

## 2021-08-19 DIAGNOSIS — G35 Multiple sclerosis: Secondary | ICD-10-CM | POA: Insufficient documentation

## 2021-08-19 DIAGNOSIS — E039 Hypothyroidism, unspecified: Secondary | ICD-10-CM | POA: Insufficient documentation

## 2021-08-19 DIAGNOSIS — I480 Paroxysmal atrial fibrillation: Secondary | ICD-10-CM | POA: Insufficient documentation

## 2021-08-19 DIAGNOSIS — N840 Polyp of corpus uteri: Secondary | ICD-10-CM | POA: Diagnosis not present

## 2021-08-19 DIAGNOSIS — M1990 Unspecified osteoarthritis, unspecified site: Secondary | ICD-10-CM | POA: Insufficient documentation

## 2021-08-19 DIAGNOSIS — G4733 Obstructive sleep apnea (adult) (pediatric): Secondary | ICD-10-CM | POA: Insufficient documentation

## 2021-08-19 DIAGNOSIS — N8502 Endometrial intraepithelial neoplasia [EIN]: Secondary | ICD-10-CM | POA: Diagnosis not present

## 2021-08-19 DIAGNOSIS — Z7901 Long term (current) use of anticoagulants: Secondary | ICD-10-CM | POA: Insufficient documentation

## 2021-08-19 DIAGNOSIS — K219 Gastro-esophageal reflux disease without esophagitis: Secondary | ICD-10-CM | POA: Diagnosis not present

## 2021-08-19 DIAGNOSIS — F32A Depression, unspecified: Secondary | ICD-10-CM | POA: Insufficient documentation

## 2021-08-19 DIAGNOSIS — J45909 Unspecified asthma, uncomplicated: Secondary | ICD-10-CM | POA: Insufficient documentation

## 2021-08-19 DIAGNOSIS — Z791 Long term (current) use of non-steroidal anti-inflammatories (NSAID): Secondary | ICD-10-CM | POA: Diagnosis not present

## 2021-08-19 DIAGNOSIS — I509 Heart failure, unspecified: Secondary | ICD-10-CM | POA: Diagnosis not present

## 2021-08-19 DIAGNOSIS — B3731 Acute candidiasis of vulva and vagina: Secondary | ICD-10-CM | POA: Insufficient documentation

## 2021-08-19 DIAGNOSIS — Z79899 Other long term (current) drug therapy: Secondary | ICD-10-CM | POA: Diagnosis not present

## 2021-08-19 DIAGNOSIS — B379 Candidiasis, unspecified: Secondary | ICD-10-CM

## 2021-08-19 DIAGNOSIS — R61 Generalized hyperhidrosis: Secondary | ICD-10-CM | POA: Diagnosis not present

## 2021-08-19 DIAGNOSIS — Z87891 Personal history of nicotine dependence: Secondary | ICD-10-CM | POA: Diagnosis not present

## 2021-08-19 DIAGNOSIS — Z79890 Hormone replacement therapy (postmenopausal): Secondary | ICD-10-CM | POA: Insufficient documentation

## 2021-08-19 DIAGNOSIS — E785 Hyperlipidemia, unspecified: Secondary | ICD-10-CM | POA: Diagnosis not present

## 2021-08-19 DIAGNOSIS — Z86718 Personal history of other venous thrombosis and embolism: Secondary | ICD-10-CM | POA: Insufficient documentation

## 2021-08-19 DIAGNOSIS — F419 Anxiety disorder, unspecified: Secondary | ICD-10-CM | POA: Diagnosis not present

## 2021-08-19 DIAGNOSIS — I110 Hypertensive heart disease with heart failure: Secondary | ICD-10-CM | POA: Diagnosis not present

## 2021-08-19 MED ORDER — FLUCONAZOLE 150 MG PO TABS: 150.0000 mg | ORAL_TABLET | Freq: Every day | ORAL | 0 refills | Status: DC

## 2021-08-19 MED ORDER — FLUCONAZOLE 150 MG PO TABS
150.0000 mg | ORAL_TABLET | Freq: Every day | ORAL | 0 refills | Status: DC
Start: 2021-08-19 — End: 2021-08-19

## 2021-08-19 NOTE — Patient Instructions (Signed)
It was good to see you today.  I will call you with the biopsy results.  If no precancer still on the biopsy, then we will plan for follow-up in 9-12 months unless something changes.  As always, please call me if you develop vaginal bleeding, discharge, pelvic pain or cramping, or other symptoms.

## 2021-08-19 NOTE — Progress Notes (Signed)
Gynecologic Oncology Return Clinic Visit  08/19/2021  Reason for Visit: surveillance in the setting of hormonal treatment of CAH  Treatment History: Patient presented with vaginal discharge 01/08/20 - Pelvic ultrasound showed stable 3.5cm adnexal mass and thickened endometrial lining with large polyp noted on SIS 01/15/20 - hysteroscopy with polypectomy and D&C. Pathology: Complex atypical hyperplasia.  Endometrial curettings: Scant endocervical type glandular tissue, insufficient endometrial tissue. 02/16/20 - D&C, Mirena IUD insertion. Pathology: benign inactive endometrium with focal secretory changes, no hyperplasia or carcinoma. 06/24/20 -endometrial biopsy showed fragmented endometrium with metaplastic changes.  No atypia or features of malignancy.  Glands are fragmented which somewhat hampers evaluation for hyperplasia. 10/19/20 -endometrial biopsy shows scant inactive endometrium, no hyperplasia or malignancy.  Interval History: Doing well.  Denies any vaginal bleeding since her last visit with me.  Has had occasional episodes of what she calls night sweats since the IUD was placed.  She is currently being treated for urinary tract infection.  She denies any abdominal or pelvic pain.  She has some vulvar and groin irritation as well as discharge which she thinks is a yeast infection.  She continues to use the nystatin externally.  Past Medical/Surgical History: Past Medical History:  Diagnosis Date   Abnormality of gait 03/01/2015   Anxiety    Arthritis    "knees" (01/19/2016)   Asthma    as a child    Back pain    Bilateral carpal tunnel syndrome 10/04/2020   Cancer ([place])    / RENAL CELL CARCINOMA - GETS CT SCAN EVERY 6 MONTHS TO WATCH    CHF (congestive heart failure) (HCC)    Chronic pain    "nerve pain from the MS"   Complex atypical endometrial hyperplasia    Constipation    Depression    DVT (deep venous thrombosis) ([place]) 1983   "RLE; may have just been phlebitis; it was  before the age of dopplers"   Dysrhythmia    afib    Edema    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' ablation of R GSV 2012   GERD (gastroesophageal reflux disease)    HLD (hyperlipidemia)    Hypertension    Hypotension    Hypothyroidism    Joint pain    Multiple sclerosis ([place])    has had this may 1989-[facility] reg doc   OSA on CPAP    bipap machine - SLEEP APNEA WORSE SINCE COVID IN 1/21 PER PATIENT SETTING HAS INCREASED FROM 9 TO 30    Osteoarthritis    PAF (paroxysmal atrial fibrillation) ([place])    on coumadin; documented on monitor 06/2010 - no longer takes per [facility] 02/09/21   Pneumonia 11/2011   Sleep apnea    uses sleep apnea machine    Past Surgical History:  Procedure Laterality Date   ANTERIOR CERVICAL DECOMP/DISCECTOMY FUSION  01/2004   CARDIAC CATHETERIZATION  06/22/2010   normal coronary arteries, PAF   CATARACT EXTRACTION, BILATERAL Bilateral    CHILD BIRTHS     X2   DILATATION & CURETTAGE/HYSTEROSCOPY WITH MYOSURE N/A 01/15/2020   Procedure: DILATATION & CURETTAGE/HYSTEROSCOPY WITH MYOSURE;  Surgeon: Arvella Nigh, MD;  Location: [facility];  Service: Gynecology;  Laterality: N/A;  pt in wheelchair-has MS   DILATION AND CURETTAGE OF UTERUS     DILATION AND CURETTAGE OF UTERUS N/A 02/16/2020   Procedure: DILATATION AND CURETTAGE;  Surgeon: Lafonda Mosses, MD;  Location: [facility];  Service: Gynecology;  Laterality: N/A;  NOT GENERAL -NO INTUBATION   INFUSION PUMP IMPLANTATION  ~ 2009   baclofen infusion in lower abd   INTRATHECAL PUMP REVISION N/A 02/18/2021   Procedure: Baclofen Pump replacement with possible revision of catheter;  Surgeon: Erline Levine, MD;  Location: [facility];  Service: Neurosurgery;  Laterality: N/A;  3C/RM 20   INTRAUTERINE DEVICE (IUD) INSERTION N/A 02/16/2020   Procedure: INTRAUTERINE DEVICE (IUD) INSERTION - MIRENA;  Surgeon: Lafonda Mosses, MD;  Location: [facility];  Service: Gynecology;  Laterality: N/A;   PAIN PUMP  REVISION N/A 07/29/2014   Procedure: Baclofen pump replacement;  Surgeon: Erline Levine, MD;  Location: [facility];  Service: Neurosurgery;  Laterality: N/A;  Baclofen pump replacement   TONSILLECTOMY AND ADENOIDECTOMY     VARICOSE VEIN SURGERY  ~ 1968   VENOUS ABLATION  12/16/2010   radiofreq ablation -Dr Elisabeth Cara and Hilty   WISDOM TOOTH EXTRACTION      Family History  Problem Relation Age of Onset   Coronary artery disease Father        at age 66   Hyperlipidemia Father    Thyroid disease Father    Coronary artery disease Maternal Grandmother    Depression Maternal Grandmother    Cancer Paternal Grandmother        breast   Depression Mother    Sudden death Mother    Anxiety disorder Mother    Alcoholism Son     Social History   Socioeconomic History   Marital status: Married    Spouse name: [name]   Number of children: 2   Years of education: COLLEGE   Highest education level: Not on file  Occupational History   Occupation: RETIRED  Tobacco Use   Smoking status: Former    Packs/day: 0.50    Years: 7.00    Pack years: 3.50    Types: Cigarettes    Quit date: 08/21/1976    Years since quitting: 45.0   Smokeless tobacco: Never  Vaping Use   Vaping Use: Never used  Substance and Sexual Activity   Alcohol use: Not Currently   Drug use: No   Sexual activity: Not Currently    Birth control/protection: Post-menopausal  Other Topics Concern   Not on file  Social History Narrative   Lives in [place] with Husband [name] [phone], [phone]   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   Right handed   Drinks a little caffeine in coffee sometimes    Social Determinants of Health   Financial Resource Strain: Not on file  Food Insecurity: Not on file  Transportation Needs: Not on file  Physical Activity: Not on file  Stress: Not on file  Social Connections: Not on file    Current Medications:  Current Outpatient Medications:    acetaminophen (TYLENOL) 500 MG tablet, Take 1,000 mg by  mouth every 6 (six) hours as needed (fever/headache/pain)., Disp: , Rfl:    ALPRAZolam (XANAX) 0.5 MG tablet, TAKE 1 TABLET BY MOUTH 3  TIMES DAILY AS NEEDED FOR  ANXIETY AND SLEEP (Patient taking differently: Take 0.5 mg by mouth 3 (three) times daily as needed for anxiety or sleep.), Disp: 90 tablet, Rfl: 1   ARTIFICIAL TEARS 0.2-0.2-1 % SOLN, Apply to eye., Disp: , Rfl:    baclofen (LIORESAL) 20 MG tablet, Take 1 tablet (20 mg total) by mouth 4 (four) times daily as needed for muscle spasms., Disp: 30 each, Rfl: 0   Biotin 10000 MCG TABS, Take 10,000 mcg by mouth daily after lunch., Disp: ,  Rfl:    bisacodyl (DULCOLAX) 10 MG suppository, Place 10 mg rectally daily as needed for moderate constipation., Disp: , Rfl:    buPROPion (WELLBUTRIN XL) 150 MG 24 hr tablet, Take 1 tablet (150 mg total) by mouth daily. (Patient taking differently: Take 150 mg by mouth daily. After breakfest), Disp: 90 tablet, Rfl: 1   Calcium Carbonate-Vitamin D3 600-400 MG-UNIT TABS, Take 1 tablet by mouth in the morning and at bedtime. (After breakfast & after lunch), Disp: , Rfl:    cetirizine (ZYRTEC) 10 MG tablet, Take 10 mg by mouth daily as needed for allergies. , Disp: , Rfl:    Cholecalciferol (VITAMIN D3) 50 MCG (2000 UT) TABS, Take 2,000 Units by mouth in the morning and at bedtime. (After breakfast & after lunch), Disp: , Rfl:    Coenzyme Q10 (COQ10 PO), Take by mouth daily in the afternoon., Disp: , Rfl:    corticotropin (ACTHAR) 80 UNIT/ML injectable gel, inject 1 ml into the muscle every other day for 10 days each month, Disp: , Rfl:    diclofenac Sodium (VOLTAREN) 1 % GEL, Apply 2 g topically 3 (three) times daily as needed. (Patient taking differently: Apply 2 g topically 3 (three) times daily as needed (pain (applied to bilateral shoulders)).), Disp: 100 g, Rfl: 3   diltiazem (CARDIZEM CD) 240 MG 24 hr capsule, Take 240 mg by mouth daily., Disp: , Rfl:    docusate sodium (COLACE) 100 MG capsule, Take 1 capsule  (100 mg total) by mouth daily. (Patient taking differently: Take 100 mg by mouth 2 (two) times daily.), Disp: 10 capsule, Rfl: 0   escitalopram (LEXAPRO) 10 MG tablet, Take 1 tablet (10 mg total) by mouth daily. (Patient taking differently: Take 10 mg by mouth at bedtime.), Disp: 90 tablet, Rfl: 1   estradiol (ESTRACE) 0.1 MG/GM vaginal cream, Place vaginally as directed. On Wednesday and saturday, Disp: , Rfl:    famotidine (PEPCID) 20 MG tablet, Take 20 mg by mouth daily., Disp: , Rfl:    furosemide (LASIX) 20 MG tablet, Take 2 tablets (40 mg total) by mouth in the morning, at noon, and at bedtime. (Patient taking differently: Take 60 mg by mouth 2 (two) times daily.), Disp: 30 tablet, Rfl: 0   gabapentin (NEURONTIN) 300 MG capsule, Mrs. Swift will take 300 mg of gabapentin at 6 AM, 10 AM, 2 PM and 6 PM and 600 mg at 10 PM by mouth., Disp: 180 capsule, Rfl: 11   lactobacillus acidophilus (BACID) TABS tablet, Take 1 tablet by mouth 2 (two) times daily as needed., Disp: , Rfl:    levothyroxine (SYNTHROID, LEVOTHROID) 75 MCG tablet, Take 75 mcg by mouth daily before breakfast. (0500), Disp: , Rfl:    linaclotide (LINZESS) 145 MCG CAPS capsule, Take 1 capsule (145 mcg total) by mouth daily before breakfast. (Patient taking differently: Take 145 mcg by mouth daily before breakfast. (0500)), Disp: 90 capsule, Rfl: 3   melatonin 5 MG TABS, Take 5 mg by mouth at bedtime., Disp: , Rfl:    Menthol-Zinc Oxide (REMEDY CALAZIME) 0.4-20.5 % PSTE, Apply 1 application topically 3 (three) times daily as needed (buttocks/sacrum with incontinences care episodes)., Disp: , Rfl:    Multiple Vitamin (MULTIVITAMIN WITH MINERALS) TABS tablet, Take 1 tablet by mouth daily after lunch., Disp: , Rfl:    neomycin-bacitracin-polymyxin (NEOSPORIN) ointment, Apply 1 application topically 2 (two) times daily as needed for wound care (applied to nose (wear mask rubs))., Disp: , Rfl:    nitrofurantoin, macrocrystal-monohydrate,  (  MACROBID) 100 MG capsule, Take 100 mg by mouth 2 (two) times daily., Disp: , Rfl:    NON FORMULARY, ASV breathing machine is to be used while napping & at bedtime. Fill canister up to max line with distilled water and turn concentrator to 5LPM., Disp: , Rfl:    NYAMYC powder, Apply topically., Disp: , Rfl:    omeprazole (PRILOSEC) 20 MG capsule, Take 20 mg by mouth daily before breakfast. (0500), Disp: , Rfl:    ondansetron (ZOFRAN) 8 MG tablet, Take 8 mg by mouth daily as needed for nausea or vomiting., Disp: , Rfl:    phenazopyridine (PYRIDIUM) 100 MG tablet, Take 100 mg by mouth 3 (three) times daily., Disp: , Rfl:    potassium chloride SA (KLOR-CON) 20 MEQ tablet, Take 2 tablets (40 mEq total) by mouth daily. (Patient taking differently: Take 20 mEq by mouth 2 (two) times daily. (After lunch & after dinner)), Disp: 180 tablet, Rfl: 3   psyllium (METAMUCIL SMOOTH TEXTURE) 28 % packet, Take 1 packet by mouth at bedtime., Disp: , Rfl:    simvastatin (ZOCOR) 10 MG tablet, Take 10 mg by mouth at bedtime., Disp: , Rfl:    SYRINGE/NEEDLE, DISP, 1 ML (BD LUER-LOK SYRINGE) 25G X 5/8" 1 ML MISC, Use as directed to inject  Acthar, Disp: 10 each, Rfl: prn   zinc oxide 20 % ointment, Apply 1 application topically as needed for irritation (apply labia every shift as needed for redness)., Disp: , Rfl:    methenamine (HIPREX) 1 g tablet, Take 1 g by mouth 2 (two) times daily with a meal. (Patient not taking: Reported on 08/19/2021), Disp: , Rfl:    oxybutynin (DITROPAN) 5 MG tablet, Take 5 mg by mouth 3 (three) times daily as needed for bladder spasms.  (Patient not taking: Reported on 08/19/2021), Disp: , Rfl:    warfarin (COUMADIN) 4 MG tablet, Take 4 mg by mouth daily. (Patient not taking: Reported on 08/19/2021), Disp: , Rfl:   Review of Systems: Pertinent positives include leg swelling, dysuria, incontinence, pruritus, anxiety. Denies appetite changes, fevers, chills, fatigue, unexplained weight  changes. Denies hearing loss, neck lumps or masses, mouth sores, ringing in ears or voice changes. Denies cough or wheezing.  Denies shortness of breath. Denies chest pain or palpitations. Denies abdominal distention, pain, blood in stools, constipation, diarrhea, nausea, vomiting, or early satiety. Denies pain with intercourse, frequency, hematuria. Denies hot flashes, pelvic pain, vaginal bleeding or vaginal discharge.   Denies joint pain, back pain or muscle pain/cramps. Denies rash, or wounds. Denies dizziness, headaches, numbness or seizures. Denies swollen lymph nodes or glands, denies easy bruising or bleeding. Denies depression, confusion, or decreased concentration.  Physical Exam: BP (!) 109/53    Pulse 90    Temp 98.4 F (36.9 C)    Resp 18    SpO2 98%  General: Alert, oriented, no acute distress. HEENT: Normocephalic, atraumatic, sclera anicteric. Chest: Unlabored breathing on room air. Abdomen: Obese, soft, nontender.  Normoactive bowel sounds.  No masses or hepatosplenomegaly appreciated.   Extremities: Somewhat limited range of motion of the right lower extremity.  Warm, well perfused.  Trace edema bilaterally. Lymphatics: No cervical, supraclavicular, or inguinal adenopathy. GU: Normal appearing external genitalia without excoriation, or lesions.  Vulva is mildly irritated.  Moderate erythema of the left groin.  IUD strings visible protruding approximately 3-4 cm from the cervical os.  Bimanual exam reveals mobile uterus, no tenderness.     Procedure: Endometrial biopsy Preoperative diagnosis: Complex atypical  hyperplasia Postoperative diagnosis: Same as above Provider: Berline Lopes MD Specimens: Endometrial biopsy Procedure: After verbal consent was obtained from the patient and the risks, benefits, and alternatives of the procedure were reviewed, the patient was placed in dorsal lithotomy position.  Speculum was placed in the vagina and the cervix was well visualized.   Betadine was used x3 to cleanse the cervix.  A single-tooth tenaculum was placed on the anterior lip of the cervix.  A an endometrial Pipelle was then passed to a depth of 6 cm and 2 passes were performed.  Scant tissue was obtained and placed in formalin to be sent to pathology.  Instruments were removed from the vagina after hemostasis was assured.  Overall the patient tolerated the procedure well.  Laboratory & Radiologic Studies: None new  Assessment & Plan: Toni [name] is a 75 y.o. woman with a complex past medical history found to have complex atypical hyperplasia, likely combined to an endometrial polyp, presenting for follow-up in the setting of progesterone therapy with a Mirena IUD.  Patient is overall doing well.  She has not had any bleeding since her last visit with me.  Endometrial biopsy repeated with minimal tissue noted today.  I will call her with biopsy results.  If biopsy negative, we will plan to see her in 9-12 months.  I stressed the importance of calling if she develops vaginal bleeding or any new pelvic symptoms.  She continues to struggle with yeast infections, likely in the setting of urine incontinence and wearing a diaper.  She has significant evidence of candidiasis in her left groin and some mild evidence on her vulva.  She is using nystatin powder.  I sent a one-time dose of Diflucan to her pharmacy.  32 minutes of total time was spent for this patient encounter, including preparation, face-to-face counseling with the patient and coordination of care, and documentation of the encounter.  Jeral Pinch, MD  Division of Gynecologic Oncology  Department of Obstetrics and Gynecology  [facility]

## 2021-08-19 NOTE — Addendum Note (Signed)
Addended by: [name] D on: 08/19/2021 04:42 PM   Modules accepted: Orders

## 2021-08-19 NOTE — Telephone Encounter (Signed)
error 

## 2021-08-19 NOTE — Addendum Note (Signed)
Addended by: [name] D on: 08/19/2021 04:45 PM   Modules accepted: Orders

## 2021-08-20 DIAGNOSIS — I4891 Unspecified atrial fibrillation: Secondary | ICD-10-CM | POA: Diagnosis not present

## 2021-08-20 DIAGNOSIS — Z7901 Long term (current) use of anticoagulants: Secondary | ICD-10-CM | POA: Diagnosis not present

## 2021-08-21 DIAGNOSIS — F419 Anxiety disorder, unspecified: Secondary | ICD-10-CM | POA: Diagnosis not present

## 2021-08-22 DIAGNOSIS — Z79899 Other long term (current) drug therapy: Secondary | ICD-10-CM | POA: Diagnosis not present

## 2021-08-23 ENCOUNTER — Telehealth: Payer: Self-pay | Admitting: Gynecologic Oncology

## 2021-08-23 ENCOUNTER — Telehealth: Payer: Self-pay

## 2021-08-23 ENCOUNTER — Telehealth: Payer: Self-pay | Admitting: *Deleted

## 2021-08-23 DIAGNOSIS — Z7901 Long term (current) use of anticoagulants: Secondary | ICD-10-CM | POA: Diagnosis not present

## 2021-08-23 LAB — SURGICAL PATHOLOGY

## 2021-08-23 NOTE — Telephone Encounter (Signed)
Called patient, discussed recent biopsy results.  Plan for visit in 1 year unless she develops symptoms.  Patient voiced understanding of this.  I have asked her to call over the summer and early fall to schedule follow-up visit with me in December.  Jannette Spanner MD

## 2021-08-23 NOTE — Telephone Encounter (Signed)
Pt requested Diflucan prescription be faxed to [facility] where the pt currently resides. Fax successfully sent on 08/19/2021. [phone])

## 2021-08-23 NOTE — Telephone Encounter (Signed)
Joelene Millin from [employer] called and stated "We are entering her diflucan order into the system at the pharmacy and need a stop date. Plus there is a statement that comes up stating there is an interaction with her lexapro, xanax, zofran and coumadin. Would the physician want to override this?" Explained that someone from the office would call her back.   Joelene Millin [phone]

## 2021-08-24 DIAGNOSIS — G8929 Other chronic pain: Secondary | ICD-10-CM | POA: Diagnosis not present

## 2021-08-24 DIAGNOSIS — R5381 Other malaise: Secondary | ICD-10-CM | POA: Diagnosis not present

## 2021-08-24 DIAGNOSIS — R609 Edema, unspecified: Secondary | ICD-10-CM | POA: Diagnosis not present

## 2021-08-24 DIAGNOSIS — L258 Unspecified contact dermatitis due to other agents: Secondary | ICD-10-CM | POA: Diagnosis not present

## 2021-08-24 DIAGNOSIS — K5900 Constipation, unspecified: Secondary | ICD-10-CM | POA: Diagnosis not present

## 2021-08-24 DIAGNOSIS — R110 Nausea: Secondary | ICD-10-CM | POA: Diagnosis not present

## 2021-08-24 DIAGNOSIS — R42 Dizziness and giddiness: Secondary | ICD-10-CM | POA: Diagnosis not present

## 2021-08-24 DIAGNOSIS — N390 Urinary tract infection, site not specified: Secondary | ICD-10-CM | POA: Diagnosis not present

## 2021-08-24 DIAGNOSIS — R140 Abdominal distension (gaseous): Secondary | ICD-10-CM | POA: Diagnosis not present

## 2021-08-24 DIAGNOSIS — G35 Multiple sclerosis: Secondary | ICD-10-CM | POA: Diagnosis not present

## 2021-08-24 DIAGNOSIS — N3289 Other specified disorders of bladder: Secondary | ICD-10-CM | POA: Diagnosis not present

## 2021-08-24 DIAGNOSIS — G4700 Insomnia, unspecified: Secondary | ICD-10-CM | POA: Diagnosis not present

## 2021-08-25 DIAGNOSIS — R140 Abdominal distension (gaseous): Secondary | ICD-10-CM | POA: Diagnosis not present

## 2021-08-29 ENCOUNTER — Telehealth: Payer: Self-pay | Admitting: Internal Medicine

## 2021-08-29 DIAGNOSIS — K5900 Constipation, unspecified: Secondary | ICD-10-CM | POA: Diagnosis not present

## 2021-08-29 DIAGNOSIS — R140 Abdominal distension (gaseous): Secondary | ICD-10-CM | POA: Diagnosis not present

## 2021-08-29 DIAGNOSIS — R609 Edema, unspecified: Secondary | ICD-10-CM | POA: Diagnosis not present

## 2021-08-29 DIAGNOSIS — N390 Urinary tract infection, site not specified: Secondary | ICD-10-CM | POA: Diagnosis not present

## 2021-08-29 DIAGNOSIS — I509 Heart failure, unspecified: Secondary | ICD-10-CM | POA: Diagnosis not present

## 2021-08-29 DIAGNOSIS — B372 Candidiasis of skin and nail: Secondary | ICD-10-CM | POA: Diagnosis not present

## 2021-08-29 DIAGNOSIS — G8929 Other chronic pain: Secondary | ICD-10-CM | POA: Diagnosis not present

## 2021-08-29 DIAGNOSIS — G35 Multiple sclerosis: Secondary | ICD-10-CM | POA: Diagnosis not present

## 2021-08-29 DIAGNOSIS — R52 Pain, unspecified: Secondary | ICD-10-CM | POA: Diagnosis not present

## 2021-08-29 DIAGNOSIS — R110 Nausea: Secondary | ICD-10-CM | POA: Diagnosis not present

## 2021-08-29 DIAGNOSIS — L258 Unspecified contact dermatitis due to other agents: Secondary | ICD-10-CM | POA: Diagnosis not present

## 2021-08-29 DIAGNOSIS — E039 Hypothyroidism, unspecified: Secondary | ICD-10-CM | POA: Diagnosis not present

## 2021-08-29 DIAGNOSIS — R0602 Shortness of breath: Secondary | ICD-10-CM | POA: Diagnosis not present

## 2021-08-29 NOTE — Telephone Encounter (Signed)
Pt c/o swelling: STAT is pt has developed SOB within 24 hours  How much weight have you gained and in what time span?  Patient states her weight has been fluctuating, but she does not have any readings  If swelling, where is the swelling located?  Stomach and thighs   Are you currently taking a fluid pill?  Lasix 60 MG 2x daily  Spironolactone (only took for 3 days)  Are you currently SOB?  No   Do you have a log of your daily weights (if so, list)?  No log available  Have you gained 3 pounds in a day or 5 pounds in a week?  Unsure   Have you traveled recently?  No

## 2021-08-29 NOTE — Telephone Encounter (Signed)
Returned call to patient of Dr. Debara Pickett   She is residing at [place] home She has been having swelling in stomach/thighs She states her swelling in stomach makes her look "like she is 6 months pregnant" She wears thigh-high compression stockings Lower legs have no edema Denies shortness of breath, lungs clear Yesterday morning and today she woke up with swelling - typically her BID lasix 60mg  is sufficient and she uses a purewick at night which collects fluid/UOP  The NP at this facility prescribed her a PRN diuretic x3 days (taken Thurs, Fri, Sat)  -- she will have BMP, BNP tomorrow -- she reports prior BNPs were 105 and then 114 -- NP at this facility mentioned to her about changing diuretic   She is getting daily weights at facility  Asked that labs, weights be faxed.  Scheduled for DOD appt with Hilty MD on 09/08/21  Routed to MD to review   Phone number for [facility]: [phone]

## 2021-08-29 NOTE — Telephone Encounter (Signed)
Spoke with patient and relayed message from MD. She voiced understanding

## 2021-08-29 NOTE — Telephone Encounter (Signed)
Will re-evaluate diuretic at her office visit - otherwise, management per providers at her nursing facility  Dr Lemmie Evens

## 2021-08-30 DIAGNOSIS — R0602 Shortness of breath: Secondary | ICD-10-CM | POA: Diagnosis not present

## 2021-08-30 DIAGNOSIS — R109 Unspecified abdominal pain: Secondary | ICD-10-CM | POA: Diagnosis not present

## 2021-08-31 DIAGNOSIS — Z7901 Long term (current) use of anticoagulants: Secondary | ICD-10-CM | POA: Diagnosis not present

## 2021-08-31 DIAGNOSIS — I5032 Chronic diastolic (congestive) heart failure: Secondary | ICD-10-CM | POA: Diagnosis not present

## 2021-08-31 DIAGNOSIS — R140 Abdominal distension (gaseous): Secondary | ICD-10-CM | POA: Diagnosis not present

## 2021-08-31 DIAGNOSIS — N319 Neuromuscular dysfunction of bladder, unspecified: Secondary | ICD-10-CM | POA: Diagnosis not present

## 2021-08-31 DIAGNOSIS — R0602 Shortness of breath: Secondary | ICD-10-CM | POA: Diagnosis not present

## 2021-09-05 DIAGNOSIS — G35 Multiple sclerosis: Secondary | ICD-10-CM | POA: Diagnosis not present

## 2021-09-05 DIAGNOSIS — R609 Edema, unspecified: Secondary | ICD-10-CM | POA: Diagnosis not present

## 2021-09-05 DIAGNOSIS — K5900 Constipation, unspecified: Secondary | ICD-10-CM | POA: Diagnosis not present

## 2021-09-05 DIAGNOSIS — R140 Abdominal distension (gaseous): Secondary | ICD-10-CM | POA: Diagnosis not present

## 2021-09-05 DIAGNOSIS — L258 Unspecified contact dermatitis due to other agents: Secondary | ICD-10-CM | POA: Diagnosis not present

## 2021-09-05 DIAGNOSIS — E785 Hyperlipidemia, unspecified: Secondary | ICD-10-CM | POA: Diagnosis not present

## 2021-09-05 DIAGNOSIS — R300 Dysuria: Secondary | ICD-10-CM | POA: Diagnosis not present

## 2021-09-05 DIAGNOSIS — G4700 Insomnia, unspecified: Secondary | ICD-10-CM | POA: Diagnosis not present

## 2021-09-05 DIAGNOSIS — K219 Gastro-esophageal reflux disease without esophagitis: Secondary | ICD-10-CM | POA: Diagnosis not present

## 2021-09-06 DIAGNOSIS — I10 Essential (primary) hypertension: Secondary | ICD-10-CM | POA: Diagnosis not present

## 2021-09-06 DIAGNOSIS — N390 Urinary tract infection, site not specified: Secondary | ICD-10-CM | POA: Diagnosis not present

## 2021-09-07 DIAGNOSIS — I4891 Unspecified atrial fibrillation: Secondary | ICD-10-CM | POA: Diagnosis not present

## 2021-09-07 DIAGNOSIS — N390 Urinary tract infection, site not specified: Secondary | ICD-10-CM | POA: Diagnosis not present

## 2021-09-08 ENCOUNTER — Encounter: Payer: Self-pay | Admitting: Internal Medicine

## 2021-09-08 ENCOUNTER — Other Ambulatory Visit: Payer: Self-pay

## 2021-09-08 ENCOUNTER — Ambulatory Visit (INDEPENDENT_AMBULATORY_CARE_PROVIDER_SITE_OTHER): Payer: Medicare Other | Admitting: Internal Medicine

## 2021-09-08 VITALS — BP 106/66 | HR 93 | Ht 63.0 in | Wt 194.0 lb

## 2021-09-08 DIAGNOSIS — G35 Multiple sclerosis: Secondary | ICD-10-CM | POA: Diagnosis not present

## 2021-09-08 DIAGNOSIS — I4821 Permanent atrial fibrillation: Secondary | ICD-10-CM

## 2021-09-08 DIAGNOSIS — I5032 Chronic diastolic (congestive) heart failure: Secondary | ICD-10-CM

## 2021-09-08 NOTE — Patient Instructions (Signed)
Medication Instructions:  Your physician recommends that you continue on your current medications as directed. Please refer to the Current Medication list given to you today.  *If you need a refill on your cardiac medications before your next appointment, please call your pharmacy*   Follow-Up: At [facility], you and your health needs are our priority.  As part of our continuing mission to provide you with exceptional heart care, we have created designated Provider Care Teams.  These Care Teams include your primary Cardiologist (physician) and Advanced Practice Providers (APPs -  Physician Assistants and Nurse Practitioners) who all work together to provide you with the care you need, when you need it.  We recommend signing up for the patient portal called "MyChart".  Sign up information is provided on this After Visit Summary.  MyChart is used to connect with patients for Virtual Visits (Telemedicine).  Patients are able to view lab/test results, encounter notes, upcoming appointments, etc.  Non-urgent messages can be sent to your provider as well.   To learn more about what you can do with MyChart, go to [email or web address].    Your next appointment:   1 year with Dr. Debara Pickett    Other Instructions Dr. Debara Pickett advised metolazone could be added if needed  Please have weights done daily

## 2021-09-08 NOTE — Progress Notes (Signed)
OFFICE NOTE  Chief Complaint:  Leg edema  Primary Care Physician: Jodi Marble, MD  HPI:  Toni [name] is a 76 year old female with multiple sclerosis, paroxysmal atrial fibrillation, on Coumadin, morbid obesity, obstructive sleep apnea, and dyslipidemia. She has had worsening swelling in her legs, thought to be due to an MS flare. She has a history of varicose veins with severe venous insufficiency in both the right and left greater saphenous veins. The right greater saphenous vein was actually successfully ablated by my partner, Dr. Elisabeth Cara, about 1 to 2 years ago. She does have residual reflux in the left greater saphenous vein extending from the proximal calf up to the saphenofemoral junction. The vein measured between 4 and 8 mm with between 1 and 6 seconds of reflux. We had discussed ablation of that before, but she has canceled several times at procedures. At this point, I think there is little benefit to continuing to try for her to undergo ablation. 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 She can wear work showed a compression stockings and this does seem to help her. With regards to her atrial fibrillation she has not had significant recurrence to her knowledge. She continues on warfarin and has been therapeutic, with her INR of 1.9 in the office by fingerstick today.  Toni [name] returns today for followup. She reports her MS is somewhat progressive. She continues to have some enlargement of her legs but no significant swelling. There is a mild varicosities in the right lower extremity. She is unaware of her atrial fibrillation. INR today was 2.3.   I saw Toni [name] back in the office today,  She is describing more shortness of breath and feels that she's been in A. Fib more recently. In fact today6/21her EKG does show persistent atrial fibrillation. In the past she's been paroxysmal , but it may be that she is more frequently having  A. Fib. She has trouble telling whether not she is more short of breath or symptomatic from A. Fib versus her multiple sclerosis. She is also reporting more swelling in her left leg.  Toni [name] returns for follow-up today. She recently saw her neurologist regarding her MS which seems to be flaring. In addition she was noted to have significant apneic events despite using CPAP. There is concern for possible central sleep apnea. Both of these episodes are probably contributing to her recurrent atrial fibrillation. She's been wearing a monitor for a period of time which indicates a burden of A. fib of about 80% of the time. It does not appear that she is in persistent A. fib, rather she has had some episodes of sinus rhythm, but they're much less frequent than her A. Fib.  I saw Toni [name] back today in the office. Recently I saw her as an add-on visit briefly as she was describing some chest pain when she was getting her INR checked. An EKG at that time showed no ischemia. I did not feel that this was cardiac. She continues intermittently have abnormal pains both in her chest as well as her abdomen and extremities. This may be related to MS. She's had difficulty with CPAP and apparently getting a good mask fit. She has been a persistent atrial fibrillation for about 6 months. It's unclear whether or not she symptomatic with this and therefore I have not pursued a rhythm control strategy. Her rate control is generally pretty good and she has been therapeutic  on warfarin. She is reporting worsening swelling of her legs although her weight she says is been stable. She has a history of venous insufficiency and had a right greater saphenous vein ablation by one of my partners. She's concerned about more swelling in her left leg and whether she's developed worsening venous insufficiency. She also reports cold feet with numbness in her extremities. As she is basically wheelchair bound, I cannot assess whether she is  having any claudication.  Toni [name] returns today for follow-up. She underwent venous and arterial lower external Dopplers. Arterial Dopplers were normal which showed no evidence of obstruction. Her venous Doppler showed no DVT. There was significant venous reflux in the left greater saphenous vein. This is an area where she has swelling and discomfort. This may be amenable to treatment. I increased her Lasix and she reports some improvement in her leg swelling. She's trying to elevate her feet and reports she is intolerant of wearing compression stockings.  02/03/2016  Toni [name] was seen today in the office for follow-up of recent hospitalization. She was seeing her neurologist and complained of left chest and left arm pain and was referred to the hospital. She ruled out for MI and was felt that her chest pain was on coronary. She underwent an echocardiogram which showed low-normal EF but was also found to be in A. fib with rapid ventricular response. Her A. fib is persistent at this time but the rate was elevated. She was started on Cardizem which helped improve her rate and was discharged on Cardizem CD 120 mg daily. Heart rate today is better controlled in the 70s. She reports her arm pain is improved and she said today that she "did not feel that it was her heart".  05/09/2016  Toni [name] was seen in the office for follow-up today. She reports improved heart rate control with heart rates between 50 and 80 on diltiazem. Over this will help with her mild cardiomyopathy with EF 50-55% on recent echo. She reports some increased fluid gain particularly in her abdomen. This may be due to some degree of right heart dysfunction. She's working with her neurologist for MS as well as her sleep apnea. There is a component of central sleep apnea and she is reportedly going to have additional sleep study to look for the need for BiPAP therapy. She denies any recurrent chest pain or left arm pain.  02/14/2017  Mrs.  [name] was seen today in follow-up. She was recently hospitalized. She said problems with increasing fluid gain. I increased her Lasix last time however when she was hospitalized she required high-dose Lasix and says she has she felt better with that. She subsequently been put on BiPAP therapy. She says her sleeping is improved significantly. She is concerned though recently she's had some increased weight gain. She feels like she may need an increasing dose of diuretic. Her last echo was in December 2017 which showed an EF of 55%. This is been stable. If this is some heart failure, may be related diastolic dysfunction however suspect is more likely related to autonomic dysfunction the setting of multiple sclerosis.  03/29/2017  Mrs. Baucum returns today for follow-up. She continues to report lower extremity edema and abdominal swelling. We'll increase her Lasix however she has not noted significant weight loss. It is actually very difficult to estimate her weight because of her MS. We tried to place her on the scale today however she was unable to stand long enough to get an  accurate weight. She reports urination was not as good on 40 mg of Lasix but is currently taking 80 mg once daily and has better urination with that.  05/18/2017  Mrs. Chernick was seen today in follow-up. She reports some improvement in her abdominal swelling and bloating. Check she feels like she might be somewhat dry now. Her weight is down to 246 pounds from 253 pounds. She is also working with the weight loss clinic at [facility]. She is interested in decreasing her diuretic which is reasonable. She asked again about probably trying to get back into sinus rhythm however she's been in persistent A. fib which is long-standing at this point for more than one or 2 years. Her last echo in December 2017 showed mild left atrial enlargement. Is not clear that she's symptomatic with her A. fib although does feel tired at times. She says she's had  some improvement with her sleep apnea therapy.  01/15/2018  Mrs. Talmadge returns today for follow-up.  She is continued to lose some weight.  Weight is down now to 218 pounds, significant improvement from her prior weight over 250 pounds.  She also had marked reduction in edema.  Her blood pressure is low normal at the moment and may benefit from reduction in her blood pressure medications.  Her sleep apnea has improved significantly according to Dr. Brett Fairy, especially her obstructive symptoms.  She still has central sleep apnea.  In addition she has secondary progressive MS and recently had worsening symptoms associated with that.  Heart rate was elevated today with regards to A. fib however that did come down after short period of time.  She again appears to be asymptomatic with her A. fib.  The main reason not to pursue sinus rhythm is the fact that she has been persistently in A. fib for well over 5 years, and the likelihood of reestablishing sinus rhythm is exceedingly low.  05/16/2018  Mrs. Meline is seen today in follow-up.  Her weight is down even further today to 204.  She has had recently some positional dizziness and low blood pressures.  This is likely due to the significant weight loss and I would recommend a decrease in her medications.  She says that she is breathing better.  Her sleep apnea has improved significantly according to her sleep doctor and she has having less symptoms related to her MS.  She is in permanent A. fib with controlled ventricular response.  03/10/2019  Ms. Savino returns today for follow-up.  She is done very well overall.  She is continued to lose weight with Dr. Migdalia Dk work.  Weight now 186 pounds down from 210 when I last saw her.  This is amazing since she is essentially sedentary due to her MS.  She reports reasonable control of her A. fib.  She has had some decrease in her PA P requirements with regards to sleep apnea.  Blood pressure is well controlled.  She has  recently had some swelling of the lower extremities and her abdomen.  She wonders if this could be some heart failure.  I suspect the lower extremity swelling is most likely due to venous insufficiency.  I previously performed a venous ablation on the right lower extremity and she still has some venous insufficiency of the left lower extremity however in the common femoral vein not amenable to ablation.  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   01/28/2020  Ms. Afonso is seen today in follow-up.  She continues to lose some weight related to her diet by Dr. Leafy Ro.  She today is in A. fib with RVR.  Blood pressure has run low recently.  She has not needed to use midodrine that frequently.  She has follow-up with Dr. Brett Fairy in July.  I recently had corresponded with her about findings that Ms. Thelen was hypoxic during the day as well as at night.  There are plans to both give her oxygen via BiPAP as well as possible oxygen during the day.  She has been referred to see Dr. Hermina Staggers at [facility] pulmonary.  04/29/2020  Ms. Cerney seen today for follow-up of A. fib.  Is been 3 months since I last saw her.  In the meantime she has been set up with bleed oxygen into her BiPAP.  I increased her diltiazem which is resulted in some improvement in heart rate although there is still a variability related to her A. fib.  Blood pressure is still reasonable in the low 371 systolic.  She has not required taking additional midodrine.  She feels like she is getting some abdominal bloating/swelling and has started to take some Lasix as needed for this.  She was taken off of this due to orthostasis.  09/20/2020  [name] is seen today in follow-up.  Recently she has been having more difficulty with hypoxemia and her MS.  All of this seems to have started after she had COVID-19.  She recently saw Dr. Brett Fairy and still is having difficulty with hypoxemia.  She is having a repeat sleep study and I am told I would  likely need to prescribe oxygen at night for her since apparently only either pulmonologist or cardiologist can order oxygen.  Recently her home health noted that she was swelling.  Her Lasix was increased to between 20 to 40 mg in the morning and 60 mg at night.  She wonders if this is led to some electrolyte abnormalities as its not been reassessed.  Blood pressure is good today.  Weight has been fairly stable.  09/08/2021  [name] returns for follow-up.  She has been residing at [facility].  More recently we have been notified of some worsening lower extremity edema.  She says she has had some increasing abdominal girth.  The physician assistant there had made some changes in her diuretics.  Actually weight today seems fairly stable.  She is noted to be in A. fib which is permanent but rate controlled.  BNP was performed at their facility and only mildly elevated around 120.  Otherwise labs have been reassuring.  PMHx:  Past Medical History:  Diagnosis Date   Abnormality of gait 03/01/2015   Anxiety    Arthritis    "knees" (01/19/2016)   Asthma    as a child    Back pain    Bilateral carpal tunnel syndrome 10/04/2020   Cancer ([place])    / RENAL CELL CARCINOMA - GETS CT SCAN EVERY 6 MONTHS TO WATCH    CHF (congestive heart failure) (HCC)    Chronic pain    "nerve pain from the MS"   Complex atypical endometrial hyperplasia    Constipation    Depression    DVT (deep venous thrombosis) ([place]) 1983   "RLE; may have just been phlebitis; it was before the age of dopplers"   Dysrhythmia    afib    Edema    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' ablation of R  GSV 2012   GERD (gastroesophageal reflux disease)    HLD (hyperlipidemia)    Hypertension    Hypotension    Hypothyroidism    Joint pain    Multiple sclerosis ([place])    has had this may 1989-[facility] reg doc   OSA on CPAP    bipap machine - SLEEP APNEA WORSE SINCE COVID IN 1/21 PER PATIENT SETTING HAS  INCREASED FROM 9 TO 30    Osteoarthritis    PAF (paroxysmal atrial fibrillation) ([place])    on coumadin; documented on monitor 06/2010 - no longer takes per Kym Groom 02/09/21   Pneumonia 11/2011   Sleep apnea    uses sleep apnea machine    Past Surgical History:  Procedure Laterality Date   ANTERIOR CERVICAL DECOMP/DISCECTOMY FUSION  01/2004   CARDIAC CATHETERIZATION  06/22/2010   normal coronary arteries, PAF   CATARACT EXTRACTION, BILATERAL Bilateral    CHILD BIRTHS     X2   DILATATION & CURETTAGE/HYSTEROSCOPY WITH MYOSURE N/A 01/15/2020   Procedure: DILATATION & CURETTAGE/HYSTEROSCOPY WITH MYOSURE;  Surgeon: Arvella Nigh, MD;  Location: [facility];  Service: Gynecology;  Laterality: N/A;  pt in wheelchair-has MS   DILATION AND CURETTAGE OF UTERUS     DILATION AND CURETTAGE OF UTERUS N/A 02/16/2020   Procedure: DILATATION AND CURETTAGE;  Surgeon: Lafonda Mosses, MD;  Location: [facility];  Service: Gynecology;  Laterality: N/A;  NOT GENERAL -NO INTUBATION   INFUSION PUMP IMPLANTATION  ~ 2009   baclofen infusion in lower abd   INTRATHECAL PUMP REVISION N/A 02/18/2021   Procedure: Baclofen Pump replacement with possible revision of catheter;  Surgeon: Erline Levine, MD;  Location: [facility];  Service: Neurosurgery;  Laterality: N/A;  3C/RM 20   INTRAUTERINE DEVICE (IUD) INSERTION N/A 02/16/2020   Procedure: INTRAUTERINE DEVICE (IUD) INSERTION - MIRENA;  Surgeon: Lafonda Mosses, MD;  Location: [facility];  Service: Gynecology;  Laterality: N/A;   PAIN PUMP REVISION N/A 07/29/2014   Procedure: Baclofen pump replacement;  Surgeon: Erline Levine, MD;  Location: [facility];  Service: Neurosurgery;  Laterality: N/A;  Baclofen pump replacement   TONSILLECTOMY AND ADENOIDECTOMY     VARICOSE VEIN SURGERY  ~ 1968   VENOUS ABLATION  12/16/2010   radiofreq ablation -Dr Elisabeth Cara and Mervin Ramires   WISDOM TOOTH EXTRACTION      FAMHx:  Family History  Problem Relation Age of Onset   Coronary artery disease  Father        at age 84   Hyperlipidemia Father    Thyroid disease Father    Coronary artery disease Maternal Grandmother    Depression Maternal Grandmother    Cancer Paternal Grandmother        breast   Depression Mother    Sudden death Mother    Anxiety disorder Mother    Alcoholism Son     SOCHx:   reports that she quit smoking about 45 years ago. Her smoking use included cigarettes. She has a 3.50 pack-year smoking history. She has never used smokeless tobacco. She reports that she does not currently use alcohol. She reports that she does not use drugs.  ALLERGIES:  Allergies  Allergen Reactions   Other Rash    Topical Surgical prep  - severe rash   Chlorhexidine Hives and Other (See Comments)    And, sores appear where applied   Hydrocodone Nausea And Vomiting and Other (See Comments)    Hydrocodone causes vomiting Hydrocodone causes vomiting   Oxycodone Nausea And  Vomiting and Other (See Comments)    Oral oxycodone causes vomiting   Sertraline Hives, Swelling, Rash and Other (See Comments)    [DERM][OTHER]RASH WITH SWELLING   Macrobid [Nitrofurantoin]     Other reaction(s): 3.9.17 nausea and vomitting,   Phenol Hives   Rofecoxib     Other reaction(s): Unknown    ROS: Pertinent items noted in HPI and remainder of comprehensive ROS otherwise negative.  HOME MEDS: Current Outpatient Medications  Medication Sig Dispense Refill   acetaminophen (TYLENOL) 500 MG tablet Take 1,000 mg by mouth every 6 (six) hours as needed (fever/headache/pain).     ALPRAZolam (XANAX) 0.5 MG tablet TAKE 1 TABLET BY MOUTH 3  TIMES DAILY AS NEEDED FOR  ANXIETY AND SLEEP (Patient taking differently: Take 0.5 mg by mouth 3 (three) times daily as needed for anxiety or sleep.) 90 tablet 1   ARTIFICIAL TEARS 0.2-0.2-1 % SOLN Apply to eye.     Biotin 10000 MCG TABS Take 10,000 mcg by mouth daily after lunch.     buPROPion (WELLBUTRIN XL) 150 MG 24 hr tablet Take 1 tablet (150 mg total) by mouth  daily. (Patient taking differently: Take 150 mg by mouth daily. After breakfest) 90 tablet 1   Calcium Carbonate-Vitamin D3 600-400 MG-UNIT TABS Take 1 tablet by mouth in the morning and at bedtime. (After breakfast & after lunch)     cetirizine (ZYRTEC) 10 MG tablet Take 10 mg by mouth daily as needed for allergies.      Cholecalciferol (VITAMIN D3) 50 MCG (2000 UT) TABS Take 2,000 Units by mouth in the morning and at bedtime. (After breakfast & after lunch)     Coenzyme Q10 (COQ10 PO) Take by mouth daily in the afternoon.     corticotropin (ACTHAR) 80 UNIT/ML injectable gel inject 1 ml into the muscle every other day for 10 days each month     diclofenac Sodium (VOLTAREN) 1 % GEL Apply 2 g topically 3 (three) times daily as needed. (Patient taking differently: Apply 2 g topically 3 (three) times daily as needed (pain (applied to bilateral shoulders)).) 100 g 3   diltiazem (CARDIZEM CD) 240 MG 24 hr capsule Take 240 mg by mouth daily.     docusate sodium (COLACE) 100 MG capsule Take 1 capsule (100 mg total) by mouth daily. (Patient taking differently: Take 100 mg by mouth 2 (two) times daily.) 10 capsule 0   escitalopram (LEXAPRO) 10 MG tablet Take 1 tablet (10 mg total) by mouth daily. (Patient taking differently: Take 10 mg by mouth at bedtime.) 90 tablet 1   famotidine (PEPCID) 20 MG tablet Take 20 mg by mouth daily.     furosemide (LASIX) 20 MG tablet Take 2 tablets (40 mg total) by mouth in the morning, at noon, and at bedtime. (Patient taking differently: Take 60 mg by mouth 2 (two) times daily.) 30 tablet 0   gabapentin (NEURONTIN) 300 MG capsule Mrs. Laredo will take 300 mg of gabapentin at 6 AM, 10 AM, 2 PM and 6 PM and 600 mg at 10 PM by mouth. 180 capsule 11   lactobacillus acidophilus (BACID) TABS tablet Take 1 tablet by mouth 2 (two) times daily as needed.     levothyroxine (SYNTHROID, LEVOTHROID) 75 MCG tablet Take 75 mcg by mouth daily before breakfast. (0500)     linaclotide (LINZESS)  145 MCG CAPS capsule Take 1 capsule (145 mcg total) by mouth daily before breakfast. (Patient taking differently: Take 145 mcg by mouth daily before breakfast. (  0500)) 90 capsule 3   melatonin 5 MG TABS Take 5 mg by mouth at bedtime.     Menthol-Zinc Oxide (REMEDY CALAZIME) 0.4-20.5 % PSTE Apply 1 application topically 3 (three) times daily as needed (buttocks/sacrum with incontinences care episodes).     methenamine (HIPREX) 1 g tablet Take 1 g by mouth 2 (two) times daily with a meal.     Multiple Vitamin (MULTIVITAMIN WITH MINERALS) TABS tablet Take 1 tablet by mouth daily after lunch.     neomycin-bacitracin-polymyxin (NEOSPORIN) ointment Apply 1 application topically 2 (two) times daily as needed for wound care (applied to nose (wear mask rubs)).     NON FORMULARY ASV breathing machine is to be used while napping & at bedtime. Fill canister up to max line with distilled water and turn concentrator to 5LPM.     NYAMYC powder Apply topically.     omeprazole (PRILOSEC) 20 MG capsule Take 20 mg by mouth daily before breakfast. (0500)     ondansetron (ZOFRAN) 8 MG tablet Take 8 mg by mouth daily as needed for nausea or vomiting.     oxybutynin (DITROPAN) 5 MG tablet Take 5 mg by mouth 3 (three) times daily as needed for bladder spasms.     phenazopyridine (PYRIDIUM) 100 MG tablet Take 100 mg by mouth 3 (three) times daily.     potassium chloride SA (KLOR-CON) 20 MEQ tablet Take 2 tablets (40 mEq total) by mouth daily. (Patient taking differently: Take 20 mEq by mouth 2 (two) times daily. (After lunch & after dinner)) 180 tablet 3   psyllium (METAMUCIL SMOOTH TEXTURE) 28 % packet Take 1 packet by mouth at bedtime.     simvastatin (ZOCOR) 10 MG tablet Take 10 mg by mouth at bedtime.     SYRINGE/NEEDLE, DISP, 1 ML (BD LUER-LOK SYRINGE) 25G X 5/8" 1 ML MISC Use as directed to inject  Acthar 10 each prn   warfarin (COUMADIN) 4 MG tablet Take 4 mg by mouth daily.     zinc oxide 20 % ointment Apply 1  application topically as needed for irritation (apply labia every shift as needed for redness).     No current facility-administered medications for this visit.    LABS/IMAGING: No results found. However, due to the size of the patient record, not all encounters were searched. Please check Results Review for a complete set of results. No results found.  VITALS: BP 106/66    Pulse 93    Ht 5\' 3"  (1.6 m)    Wt 194 lb (88 kg)    SpO2 97%    BMI 34.37 kg/m   EXAM: General appearance: alert, no distress and moderately obese Neck: no carotid bruit, no JVD and thyroid not enlarged, symmetric, no tenderness/mass/nodules Lungs: clear to auscultation bilaterally Heart: irregularly irregular rhythm Abdomen: soft, non-tender; bowel sounds normal; no masses,  no organomegaly Extremities: edema Trace lower extremity Pulses: 2+ and symmetric Skin: Skin color, texture, turgor normal. No rashes or lesions Neurologic: Mental status: Alert, oriented, thought content appropriate Psych: Pleasant  EKG: A. fib at 93 - personally reviewed  ASSESSMENT: Permanent atrial fibrillation - 80% frequency of A. Fib Morbid obesity Obstructive sleep apnea on BIPAP Multiple sclerosis Chronic anticoagulation on warfarin - INR 2.3 today Chronic venous insufficiency - +LGSV reflux Hypertension-controlled Noncardiac chest and left arm pain Non-ischemic cardiomyopathy - EF 50%, improved to 55% (07/2016) Acute on chronic diastolic congestive heart failure  PLAN: 1.   Mrs. Mireles has had some recent lower extremity  swelling and then improvement.  She has had some feeling of fullness in her abdomen which I suspect may be related to constipation.  She had ultrasound recently which showed no ascites and her lower extremity edema is nonpitting.  Weight has been essentially stable.  She is in A. fib which is rate controlled.  I have not advised any additional medications however if she would gain 2 to 3 pounds over a few  days she could have metolazone 2.5 mg for additional diuresis.  She should have daily weights monitored and I have instructed this to [facility].  Follow-up with me in 1 year or sooner as necessary.  Pixie Casino, MD, [facility], [place] Director of the Advanced Lipid Disorders &  Cardiovascular Risk Reduction Clinic Diplomate of the American Board of Clinical Lipidology Attending Cardiologist  Direct Dial: [phone]   Fax: [phone]  Website:  [name] 09/08/2021, 10:16 PM

## 2021-09-09 DIAGNOSIS — R1310 Dysphagia, unspecified: Secondary | ICD-10-CM | POA: Diagnosis not present

## 2021-09-10 DIAGNOSIS — E559 Vitamin D deficiency, unspecified: Secondary | ICD-10-CM | POA: Diagnosis not present

## 2021-09-10 DIAGNOSIS — E039 Hypothyroidism, unspecified: Secondary | ICD-10-CM | POA: Diagnosis not present

## 2021-09-10 DIAGNOSIS — E785 Hyperlipidemia, unspecified: Secondary | ICD-10-CM | POA: Diagnosis not present

## 2021-09-10 DIAGNOSIS — M1990 Unspecified osteoarthritis, unspecified site: Secondary | ICD-10-CM | POA: Diagnosis not present

## 2021-09-10 DIAGNOSIS — I509 Heart failure, unspecified: Secondary | ICD-10-CM | POA: Diagnosis not present

## 2021-09-10 DIAGNOSIS — I4891 Unspecified atrial fibrillation: Secondary | ICD-10-CM | POA: Diagnosis not present

## 2021-09-12 ENCOUNTER — Ambulatory Visit (INDEPENDENT_AMBULATORY_CARE_PROVIDER_SITE_OTHER): Payer: Medicare Other | Admitting: Neurology

## 2021-09-12 ENCOUNTER — Encounter: Payer: Self-pay | Admitting: Neurology

## 2021-09-12 ENCOUNTER — Other Ambulatory Visit: Payer: Self-pay

## 2021-09-12 VITALS — BP 102/68 | HR 71 | Ht 63.0 in | Wt 193.0 lb

## 2021-09-12 DIAGNOSIS — G35 Multiple sclerosis: Secondary | ICD-10-CM

## 2021-09-12 DIAGNOSIS — I4891 Unspecified atrial fibrillation: Secondary | ICD-10-CM | POA: Diagnosis not present

## 2021-09-12 DIAGNOSIS — R252 Cramp and spasm: Secondary | ICD-10-CM

## 2021-09-12 DIAGNOSIS — U099 Post covid-19 condition, unspecified: Secondary | ICD-10-CM

## 2021-09-12 DIAGNOSIS — E240 Pituitary-dependent Cushing's disease: Secondary | ICD-10-CM

## 2021-09-12 DIAGNOSIS — Z978 Presence of other specified devices: Secondary | ICD-10-CM

## 2021-09-12 DIAGNOSIS — I5032 Chronic diastolic (congestive) heart failure: Secondary | ICD-10-CM

## 2021-09-12 DIAGNOSIS — G35C0 Secondary progressive multiple sclerosis, unspecified: Secondary | ICD-10-CM

## 2021-09-12 DIAGNOSIS — R0609 Other forms of dyspnea: Secondary | ICD-10-CM | POA: Diagnosis not present

## 2021-09-12 DIAGNOSIS — I110 Hypertensive heart disease with heart failure: Secondary | ICD-10-CM | POA: Diagnosis not present

## 2021-09-12 DIAGNOSIS — Z7901 Long term (current) use of anticoagulants: Secondary | ICD-10-CM | POA: Diagnosis not present

## 2021-09-12 MED ORDER — ACTHAR 80 UNIT/ML IJ GEL: INTRAMUSCULAR | 5 refills | Status: DC

## 2021-09-12 NOTE — Patient Instructions (Signed)
Corticotropin, ACTH Gel for Injection What is this medication? CORTICOTROPIN (kor ti koh TROH pen) is used in children and infants less than 75 years old to treat infantile spasms. It may also be used to treat inflammation of the skin, joints, lungs, and other organs. This medicine may be used for other purposes; ask your health care provider or pharmacist if you have questions. COMMON BRAND NAME(S): Acthar, CORTROPHIN, H.P. Acthar What should I tell my care team before I take this medication? They need to know if you have any of these conditions: Cushing's syndrome diabetes heart disease high blood pressure infection especially a viral infection such as chickenpox, cold sores, or herpes kidney disease mental health disease myasthenia gravis osteoporosis, weak bones recent or upcoming vaccine recent surgery seizures stomach ulcer or intestine disease including colitis and diverticulitis thyroid problem an unusual or allergic reaction to corticotropin, corticosteroids, pork proteins, other medicines, foods, dyes, or preservatives pregnant or trying to get pregnant breast-feeding How should I use this medication? This medicine is injected under the skin or into a muscle. You will be taught how to prepare and give it. Take it as directed on the prescription label. Keep taking it unless your health care provider tells you to stop. It is important that you put your used needles and syringes in a special sharps container. Do not put them in a trash can. If you do not have a sharps container, call your pharmacist or health care provider to get one. A special MedGuide will be given to you by the pharmacist with each prescription and refill. Be sure to read this information carefully each time. This medicine comes with INSTRUCTIONS FOR USE. Ask your pharmacist for directions on how to use this medicine. Read the information carefully. Talk to your pharmacist or health care provider if you have  questions. Talk to your pediatrician regarding the use of this medicine in children. While this medicine may be prescribed for children as young as infants for selected conditions, precautions do apply. Overdosage: If you think you have taken too much of this medicine contact a poison control center or emergency room at once. NOTE: This medicine is only for you. Do not share this medicine with others. What if I miss a dose? If you miss a dose, take it as soon as you can. If it is almost time for your next dose, take only that dose. Do not take double or extra doses. What may interact with this medication? diuretics vaccines This list may not describe all possible interactions. Give your health care provider a list of all the medicines, herbs, non-prescription drugs, or dietary supplements you use. Also tell them if you smoke, drink alcohol, or use illegal drugs. Some items may interact with your medicine. What should I watch for while using this medication? Visit your health care provider for regular checks on your progress. Tell your health care provider if your symptoms do not start to get better or if they get worse. If you are taking this medicine for a long time, carry an identification card with your name, address, the type and dose of your medicine, and your doctor's name and address. This medicine may increase your risk of getting an infection. Call your health care provider for advice if you get a fever, chills, sore throat, or other symptoms of a cold or flu. Do not treat yourself. Try to avoid being around people who are sick. If you have not had the measles or chickenpox vaccines, tell  your health care provider right away if you are around someone with these viruses. This medicine can decrease the response to a vaccine. If you need to get vaccinated, tell your health care provider that you are taking this medicine. If you are going to have surgery, tell your doctor or health care  professional that you have received this medicine within the last 12 months. What side effects may I notice from receiving this medication? Side effects that you should report to your doctor or health care professional as soon as possible: allergic reactions like skin rash, itching or hives, swelling of the face, lips, or tongue bloody or black, tarry stool changes in emotions or moods eye pain high blood sugar (increased hunger, thirst or urination; unusually weak or tired, blurry vision) increased blood pressure infection (fever, chills, cough, sore throat, pain or trouble passing urine) slow growth in children (if used for longer periods of time) swelling of ankles, feet stomach pain trouble breathing unusually weak or tired weak bones (if used for longer periods of time) Side effects that usually do not require medical attention (report to your doctor or health care professional if they continue or are bothersome): acne change in appetite irritable weight gain This list may not describe all possible side effects. Call your doctor for medical advice about side effects. You may report side effects to FDA at 1-800-FDA-1088. Where should I keep my medication? Keep out of the reach of children and pets. Store in a refrigerator between 2 and 8 degrees C (36 and 46 degrees F). Get rid of any unused medicine after the expiration date. To get rid of medicines that are no longer needed or have expired: Take the medicine to a medicine take-back program. Check with your pharmacy or law enforcement to find a location. If you cannot return the medicine, ask your pharmacist or health care provider how to get rid of this medicine safely. NOTE: This sheet is a summary. It may not cover all possible information. If you have questions about this medicine, talk to your doctor, pharmacist, or health care provider.  2022 [employer] (2019-11-18 00:00:00)

## 2021-09-12 NOTE — Progress Notes (Signed)
PATIENT: GRACELYNN BIRCHER DOB: [birth year]  REASON FOR VISIT: follow up HISTORY FROM: patient  HISTORY OF PRESENT ILLNESS: 09/12/21: Mrs Bugaj is a long term MS patient , now in the non active , secondary progressive stages of the disease, seen on 09-12-2021, revisit with her daughter present.  Mrs Holecek lives now at [facility],  her speech is  slurred, she is slower in speech and comprehension, she is wheelchair  bound.  Her BP runs low and she has been  in permanent atrial fib, long haul from Covid. She has dry eyes, special problem at night. Biotin gel at night, ASV user.  She has been hospitalized many times for UTI, she can't take cranberries- Vit K.  Has been seen by gyneco-urologist. No solution for her problem was found.  She has been started on oxygen and on ASV- had a new baclofen pump-  flushed tubing after being implanted , July 1.2022. Dr [name] had reduced the baclofen dose and we will try to reduce further. Emma RN follows up.     Ms. Castellanos is a 76 year old female with a history of obstructive sleep apnea on BiPAP and multiple sclerosis. 03-07-2021.   She was seen on 03-07-2021. She has been hospitalized many times for UTI, has been started on oxygen and on ASV- had a new baclofen pump- implanted on July 1.2022. Dr [name] has reduced the baclofen dose and will try to reduce further.  Had a flushing done in hospital- cathether was open. The patient started on out to servo ventilation her compliance is excellent 100% of the last 30 days 29 of those days was over 4 hours with an average of 9 hours 46 minutes.  Residual AHI was 6.5 of which only 1/h is an apnea and 5.5 hypopneas or shallow breathing spells.  There was a minimum leak and median was 3.7 L/min so based on this and knowing that she has additional oxygen I think it will make a difference in her restorative quality of her sleep.  She is still daytime sleepy which is partially due to all other medical conditions and  also due to medication especially the higher doses of Neurontin and baclofen I think that reducing her baclofen pump a little further may help to restore some alertness and I am happy to look if we can change her gabapentin doses from lowered doses through the day to a higher dose at night.  The Epworth sleepiness score was still endorsed at 18 out of 24 points today.  Medication is widely unchanged except for the change in the new baclofen pump settings.  The patient currently takes gabapentin 3 mg at 1 capsule  up to 4 throughout the day. Every 6 hours, but medication wears off at 4 hours- will increase frequency and not dose per time.       She returns today for follow-up on 09-13-2020. We are discussing her high AHi on BiPAP, also she is very compliant with aditional 3 liters of oxygen, she will have to re qualify for oxygen , I ned her to come in for in lab study , but she is n need of a medical bed. All this has beeen hampering her progress.  Since she contracted COVID in a rehab facility she has lost weight, has SOB more often, and she is not doing well with the PAP mask. She feels she needs more ACTHAR shots.  These halp her still.  She has had a baclofen pump , but there  was enough baclofen in the pump, and she remains in pain.  She has fluid retention- Dr Debara Pickett, CHF related anasarca. belly swelling and spasms in abdomen.  She is on diuretics and potassium.  She has no bladder retention, and her CUREwick is helpful.  OT wants a new NCV. She had thenar atphroy - left hand. Dr [name] stated this is Carpal tunnel- her hand surgeon wants documentation.    Multiple sclerosis: She continues on Acthar.   She continues to have weakness in the right lower extremity.  She states that she has a hard time pivoting when ambulating.  She is in a wheelchair today.  She does feel that her memory has changed over time.  She states that if she does not make a list she typically does not remember.  She has a  caregiver as well as her husband that helps her.  Obstructive sleep apnea on BiPAP: Download indicates that she use her machine nightly for compliance of 100%.  She use her machine greater than 4 hours each night.  On average she uses it 8 hours and 50 minutes.  Her residual AHI is 21.5 on 16/11 centimeters of water.  She reports that she had an overnight pulse oximetry test with her pulmonologist that showed desaturations despite being on 3 L of oxygen at night.  She feels that her BiPAP pressure is too low.  She returns today for an evaluation.  HISTORY Mrs. Shareena Nusz who prefers to be called anti-Jo, is a 76 year old Caucasian female with a longstanding history of multiple sclerosis.  Her primary care physician is Dr. Prince Solian, MD PhD, her cardiologist is Dr. Debara Pickett and her new pulmonologist is Dr. Ander Slade.  [name], she has a baclofen pump to deal with the spasticity remaining she had further progression after she contracted Covid, she still feels that she has not recovered her baseline which is very likely.  The patient has atrial fibrillation for which she is followed by Dr. Debara Pickett, steroid infusions that be used in the past to limit an MS exacerbation had also contrast contributed to atrophic in onset.  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  BiPAP was no longer working and especially did not prevent hypoxemia at night.  She is using BiPAP present but it has produced an enormous oral airleak and she cannot tolerate a full facemask, he finally were able to convince her pulmonologist that she needs oxygen at 3 L either bled into this BiPAP device or to be used by nasal cannula .   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  Oncology gynecology did not find  other malignant cells outside of the affected polyp.  She is now wearing an IUD that has progesterone as a Depo.   Her baclofen pump will be refilled later this month by Dr. [name].  She has been taking ACTH to prevent relapses which has been kinder to her heart rate.  The baclofen pump seems to be malfunctioning and she is addressing this with Dr. [name].   She also developed constant bladder leaking and incontinence issues she has had a Foley catheter placed unfortunately there were 250 cc of residual urine that she could not spontaneously void.  With her Foley catheter however she contracted a UTI  and she is already on the third round of Macrobid.   She was diagnosed with renal cell carcinoma 5 years ago which has been status quo and is followed yearly by urology.   Vital signs today were given by her caregiver and daughter [name].  Blood pressure was 112/66 mmHg, heart rate was 74 and regular.  Her body temperature was 98.6 F, oxygen saturation was 97%.    REVIEW OF SYSTEMS: Out of a complete 14 system review of symptoms, the patient complains only of the following symptoms, and all other reviewed systems are negative.  See HPI  ALLERGIES: Allergies  Allergen Reactions   Other Rash    Topical Surgical prep  - severe rash   Chlorhexidine Hives and Other (See Comments)    And, sores appear where applied   Hydrocodone Nausea And Vomiting and Other (See Comments)    Hydrocodone causes vomiting Hydrocodone causes vomiting   Oxycodone Nausea And Vomiting and Other (See Comments)    Oral oxycodone causes vomiting   Sertraline Hives, Swelling, Rash and Other (See Comments)    [DERM][OTHER]RASH WITH SWELLING   Macrobid [Nitrofurantoin]     Other reaction(s): 3.9.17 nausea and vomitting,   Phenol Hives   Rofecoxib     Other reaction(s): Unknown    HOME MEDICATIONS: Outpatient Medications Prior to Visit  Medication Sig Dispense Refill   acetaminophen (TYLENOL) 500 MG tablet Take  1,000 mg by mouth every 6 (six) hours as needed (fever/headache/pain).     ALPRAZolam (XANAX) 0.5 MG tablet TAKE 1 TABLET BY MOUTH 3  TIMES DAILY AS NEEDED FOR  ANXIETY AND SLEEP (Patient taking differently: Take 0.5 mg by mouth 3 (three) times daily as needed for anxiety or sleep.) 90 tablet 1   ARTIFICIAL TEARS 0.2-0.2-1 % SOLN Apply to eye.     Ascorbic Acid (VITAMIN C) 1000 MG tablet Take 1,000 mg by mouth 4 (four) times daily.     Biotin 10000 MCG TABS Take 10,000 mcg by mouth daily after lunch.     buPROPion (WELLBUTRIN XL) 150 MG 24 hr tablet Take 1 tablet (150 mg total) by mouth daily. (Patient taking differently: Take 150 mg by mouth daily. After breakfest) 90 tablet 1   Calcium Carbonate-Vitamin D3 600-400 MG-UNIT TABS Take 1 tablet by mouth in the morning and at bedtime. (After breakfast & after lunch)     cetirizine (ZYRTEC) 10 MG tablet Take 10 mg by mouth daily as needed for allergies.      Cholecalciferol (VITAMIN D3) 50 MCG (2000 UT) TABS Take 2,000 Units by mouth in the morning and at bedtime. (After breakfast & after lunch)     ciprofloxacin (CIPRO) 500 MG tablet Take 500 mg by mouth 2 (two) times daily. For 7 days     Coenzyme Q10 (COQ10 PO) Take by mouth daily in the afternoon.     corticotropin (ACTHAR) 80 UNIT/ML injectable gel inject 1 ml into the muscle every other day for 10 days each month     diclofenac Sodium (VOLTAREN) 1 % GEL Apply 2 g topically 3 (three) times daily as needed. (Patient taking differently: Apply 2 g topically 3 (three) times daily as needed (pain (applied to bilateral shoulders)).) 100 g 3   diltiazem (CARDIZEM CD) 240 MG 24 hr capsule Take 240 mg by mouth daily.     docusate sodium (COLACE) 100 MG capsule Take 1 capsule (100 mg total) by mouth daily. (Patient taking differently: Take 100 mg by mouth 2 (two) times daily.)  10 capsule 0   escitalopram (LEXAPRO) 10 MG tablet Take 1 tablet (10 mg total) by mouth daily. (Patient taking differently: Take 10 mg  by mouth at bedtime.) 90 tablet 1   famotidine (PEPCID) 20 MG tablet Take 20 mg by mouth daily.     furosemide (LASIX) 20 MG tablet Take 2 tablets (40 mg total) by mouth in the morning, at noon, and at bedtime. (Patient taking differently: Take 60 mg by mouth 2 (two) times daily.) 30 tablet 0   gabapentin (NEURONTIN) 300 MG capsule Mrs. Ghan will take 300 mg of gabapentin at 6 AM, 10 AM, 2 PM and 6 PM and 600 mg at 10 PM by mouth. 180 capsule 11   lactobacillus acidophilus (BACID) TABS tablet Take 1 tablet by mouth 2 (two) times daily as needed.     levothyroxine (SYNTHROID, LEVOTHROID) 75 MCG tablet Take 75 mcg by mouth daily before breakfast. (0500)     linaclotide (LINZESS) 145 MCG CAPS capsule Take 1 capsule (145 mcg total) by mouth daily before breakfast. (Patient taking differently: Take 145 mcg by mouth daily before breakfast. (0500)) 90 capsule 3   melatonin 5 MG TABS Take 5 mg by mouth at bedtime.     Menthol-Zinc Oxide (REMEDY CALAZIME) 0.4-20.5 % PSTE Apply 1 application topically 3 (three) times daily as needed (buttocks/sacrum with incontinences care episodes).     methenamine (HIPREX) 1 g tablet Take 1 g by mouth 2 (two) times daily with a meal.     Multiple Vitamin (MULTIVITAMIN WITH MINERALS) TABS tablet Take 1 tablet by mouth daily after lunch.     neomycin-bacitracin-polymyxin (NEOSPORIN) ointment Apply 1 application topically 2 (two) times daily as needed for wound care (applied to nose (wear mask rubs)).     NON FORMULARY ASV breathing machine is to be used while napping & at bedtime. Fill canister up to max line with distilled water and turn concentrator to 5LPM.     NYAMYC powder Apply topically.     omeprazole (PRILOSEC) 20 MG capsule Take 20 mg by mouth daily before breakfast. (0500)     ondansetron (ZOFRAN) 8 MG tablet Take 8 mg by mouth daily as needed for nausea or vomiting.     oxybutynin (DITROPAN) 5 MG tablet Take 5 mg by mouth 3 (three) times daily as needed for bladder  spasms.     phenazopyridine (PYRIDIUM) 100 MG tablet Take 100 mg by mouth 3 (three) times daily.     potassium chloride SA (KLOR-CON) 20 MEQ tablet Take 2 tablets (40 mEq total) by mouth daily. (Patient taking differently: Take 20 mEq by mouth 2 (two) times daily. (After lunch & after dinner)) 180 tablet 3   psyllium (METAMUCIL SMOOTH TEXTURE) 28 % packet Take 1 packet by mouth at bedtime.     simvastatin (ZOCOR) 10 MG tablet Take 10 mg by mouth at bedtime.     SYRINGE/NEEDLE, DISP, 1 ML (BD LUER-LOK SYRINGE) 25G X 5/8" 1 ML MISC Use as directed to inject  Acthar 10 each prn   warfarin (COUMADIN) 4 MG tablet Take 4 mg by mouth daily.     zinc oxide 20 % ointment Apply 1 application topically as needed for irritation (apply labia every shift as needed for redness).     No facility-administered medications prior to visit.    PAST MEDICAL HISTORY: Past Medical History:  Diagnosis Date   Abnormality of gait 03/01/2015   Anxiety    Arthritis    "knees" (01/19/2016)  Asthma    as a child    Back pain    Bilateral carpal tunnel syndrome 10/04/2020   Cancer ([place])    / RENAL CELL CARCINOMA - GETS CT SCAN EVERY 6 MONTHS TO WATCH    CHF (congestive heart failure) (HCC)    Chronic pain    "nerve pain from the MS"   Complex atypical endometrial hyperplasia    Constipation    Depression    DVT (deep venous thrombosis) ([place]) 1983   "RLE; may have just been phlebitis; it was before the age of dopplers"   Dysrhythmia    afib    Edema    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' ablation of R GSV 2012   GERD (gastroesophageal reflux disease)    HLD (hyperlipidemia)    Hypertension    Hypotension    Hypothyroidism    Joint pain    Multiple sclerosis ([place])    has had this may 1989-[place] reg doc   OSA on CPAP    bipap machine - SLEEP APNEA WORSE SINCE COVID IN 1/21 PER PATIENT SETTING HAS INCREASED FROM 9 TO 30    Osteoarthritis    PAF (paroxysmal atrial fibrillation)  ([place])    on coumadin; documented on monitor 06/2010 - no longer takes per [facility] 02/09/21   Pneumonia 11/2011   Sleep apnea    uses sleep apnea machine    PAST SURGICAL HISTORY: Past Surgical History:  Procedure Laterality Date   ANTERIOR CERVICAL DECOMP/DISCECTOMY FUSION  01/2004   CARDIAC CATHETERIZATION  06/22/2010   normal coronary arteries, PAF   CATARACT EXTRACTION, BILATERAL Bilateral    CHILD BIRTHS     X2   DILATATION & CURETTAGE/HYSTEROSCOPY WITH MYOSURE N/A 01/15/2020   Procedure: DILATATION & CURETTAGE/HYSTEROSCOPY WITH MYOSURE;  Surgeon: Arvella Nigh, MD;  Location: [facility];  Service: Gynecology;  Laterality: N/A;  pt in wheelchair-has MS   DILATION AND CURETTAGE OF UTERUS     DILATION AND CURETTAGE OF UTERUS N/A 02/16/2020   Procedure: DILATATION AND CURETTAGE;  Surgeon: Lafonda Mosses, MD;  Location: [facility];  Service: Gynecology;  Laterality: N/A;  NOT GENERAL -NO INTUBATION   INFUSION PUMP IMPLANTATION  ~ 2009   baclofen infusion in lower abd   INTRATHECAL PUMP REVISION N/A 02/18/2021   Procedure: Baclofen Pump replacement with possible revision of catheter;  Surgeon: Erline Levine, MD;  Location: [place];  Service: Neurosurgery;  Laterality: N/A;  3C/RM 20   INTRAUTERINE DEVICE (IUD) INSERTION N/A 02/16/2020   Procedure: INTRAUTERINE DEVICE (IUD) INSERTION - MIRENA;  Surgeon: Lafonda Mosses, MD;  Location: [facility];  Service: Gynecology;  Laterality: N/A;   PAIN PUMP REVISION N/A 07/29/2014   Procedure: Baclofen pump replacement;  Surgeon: Erline Levine, MD;  Location: [place] [facility];  Service: Neurosurgery;  Laterality: N/A;  Baclofen pump replacement   TONSILLECTOMY AND ADENOIDECTOMY     VARICOSE VEIN SURGERY  ~ 1968   VENOUS ABLATION  12/16/2010   radiofreq ablation -Dr Elisabeth Cara and [facility]   WISDOM TOOTH EXTRACTION      FAMILY HISTORY: Family History  Problem Relation Age of Onset   Coronary artery disease Father        at age 80   Hyperlipidemia Father     Thyroid disease Father    Coronary artery disease Maternal Grandmother    Depression Maternal Grandmother    Cancer Paternal Grandmother        breast   Depression  Mother    Sudden death Mother    Anxiety disorder Mother    Alcoholism Son     SOCIAL HISTORY: Social History   Socioeconomic History   Marital status: Married    Spouse name: [name]   Number of children: 2   Years of education: COLLEGE   Highest education level: Not on file  Occupational History   Occupation: RETIRED  Tobacco Use   Smoking status: Former    Packs/day: 0.50    Years: 7.00    Pack years: 3.50    Types: Cigarettes    Quit date: 08/21/1976    Years since quitting: 45.0   Smokeless tobacco: Never  Vaping Use   Vaping Use: Never used  Substance and Sexual Activity   Alcohol use: Not Currently   Drug use: No   Sexual activity: Not Currently    Birth control/protection: Post-menopausal  Other Topics Concern   Not on file  Social History Narrative   Lives in [place] with Husband [name] [phone], [phone]   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   No flowsheet data found. Montreal Cognitive Assessment  07/27/2020 10/04/2015 06/02/2015 03/30/2015  Visuospatial/ Executive (0/5) 5 4 5 5   Naming (0/3) 3 3 3 3   Attention: Read list of digits (0/2) 2 2 2 2   Attention: Read list of letters (0/1) 1 1 1 1   Attention: Serial 7 subtraction starting at 100 (0/3) 3 3 3 3   Language: Repeat phrase (0/2) 2 2 2 2   Language : Fluency  (0/1) 1 1 1 1   Abstraction (0/2) 2 2 2 2   Delayed Recall (0/5) 4 5 5 5   Orientation (0/6) 6 6 6 6   Total 29 29 30 30   Adjusted Score (based on education) - 29 30 30       Lab Results  Component Value Date   WBC 5.9 02/18/2021   HGB 14.3 02/18/2021   HCT 42.7 02/18/2021   MCV 93.8 02/18/2021   PLT 176 02/18/2021      Component Value Date/Time   NA 136 02/18/2021 1047   NA 144 12/15/2020 1100   K 3.9 02/18/2021 1047   CL 95 (L) 02/18/2021 1047   CO2 33 (H) 02/18/2021 1047   GLUCOSE 87 02/18/2021 1047   BUN 19 02/18/2021 1047   BUN 27 12/15/2020 1100   CREATININE 0.54 02/18/2021 1047   CALCIUM 9.1 02/18/2021 1047   PROT 5.9 (L) 01/12/2021 0331   PROT 5.9 (L) 09/20/2020 1436   ALBUMIN 3.5 01/12/2021 0331   ALBUMIN 3.9 09/20/2020 1436   AST 17 01/12/2021 0331   ALT 15 01/12/2021 0331   ALKPHOS 56 01/12/2021 0331   BILITOT 0.4 01/12/2021 0331   BILITOT 0.3 09/20/2020 1436   GFRNONAA >60 02/18/2021 1047   GFRAA 90 09/20/2020 1436   Lab Results  Component Value Date   CHOL 111 03/19/2019   HDL 57 03/19/2019  LDLCALC 43 03/19/2019   TRIG 53 03/19/2019   CHOLHDL 3.0 04/22/2010   Lab Results  Component Value Date   HGBA1C 4.9 06/05/2020   No results found for: VITAMINB12 Lab Results  Component Value Date   TSH 2.961 01/03/2021      ASSESSMENT AND PLAN 76 y.o. year old female  has a past medical history of Abnormality of gait (03/01/2015), Anxiety, Arthritis, Asthma, Back pain, Bilateral carpal tunnel syndrome (10/04/2020), Cancer ([place]), CHF (congestive heart failure) ([place]), Chronic pain, Complex atypical endometrial hyperplasia, Constipation, Depression, DVT (deep venous thrombosis) ([place]) (1983), Dysrhythmia, Edema, GERD (gastroesophageal reflux disease), HLD (hyperlipidemia), Hypertension, Hypotension, Hypothyroidism, Joint pain, Multiple sclerosis ([place]), OSA on CPAP, Osteoarthritis, PAF (paroxysmal atrial fibrillation) ([place]), Pneumonia (11/2011), and Sleep apnea.  here with:  1.  Obstructive sleep apnea on BiPAP  --Good compliance --Residual AHI is elevated at 21.5 events an hour --We will increase pressure to 17/12.  We will repeat a download in 2 months if no improvement will proceed with BiPAP titration  2.  Multiple sclerosis  --Continue Acthar --Continue monitoring symptoms   Advised if symptoms worsen or she develops new symptoms she should let us know we will follow-up in 2 to 3 months for repeat sleep download   I spent 30 minutes of face-to-face and non-face-to-face time with patient.  This included previsit chart review, lab review, study review, order entry, electronic health record documentation, patient education. 09/12/2021, 4:01 PM [facility] [address], [place], [place] [address] [phone]     PATIENT: EMANII BUGBEE DOB: Dec 23, 1945  REASON FOR VISIT: follow up MS, OSA, Spasms, / renal cell carcinoma - Dr. Ernst Bowler   HISTORY FROM: patient alone   HISTORY OF PRESENT ILLNESS:  Today 09/12/21, Rv: seeing Mrs. Seats after last Baclofen pump refill and after sleep study:  The patient endorsed the Epworth Sleepiness Scale at 12/24 points   The patients weight 207 pounds with a height of 63 (inches), resulting in a BMI of 36.7 kg/m2. The patients neck circumference measured 18 inches.  CURRENT MEDICATIONS: Xanax, Lioresal, Zyrtec, Cardizem, Flonase, Lasix, Neurontin, Synthroid, Bystolic, Prilosec, Zantac, Zocor, Flomax, Coumadin   PATIENTS NAME:  Anshu, Wehner DOB:      1946-02-22      MR#:    [id number]     DATE OF RECORDING: 08/12/2018 [facility] REFERRING M.D.:  Prince Solian MD Study Performed:  Split-Night Titration Study HISTORY:  Mrs. Samons is a 76 year old female MS patient and with complex sleep apnea on BIPAP   She today having lost again a significant amount of weight.  She follows Dr. Leafy Ro at the medical weight management clinic.  With her weight loss came also the needs to refit  her CPAP mask.  She is using a BiPAP setting of 20/15 cmH2O and her residual AHI is 4.9/h the best for long time.  She does have minimal leaks which speaks for the good fit of her current mask.  She uses the machine 100% of the time of 7 hours and 2 minutes of average use.  I would not like to change any settings and also the patient reports that she has pressure marks and sometimes almost bruising from her facial mask seems to be the best she has done in a long time.  Her Epworth sleepiness score also has improved /she only endorsed 12 out of 24 points and she endorsed 20 out of 63 points of the fatigue severity score.  I see her as  much less fatigued, and more optimistic.  Overall stronger -no longer with home health- now with "[facility]" 3 times a week. She now can stand long enough to pull up her pants. Her last labs were reviewed. She is in chronic atrial fibrillation- rate controlled on diltiazem 180 mg, BP is lower.    Patient returning for a BIPAP Titration sleep study scored at 4%, with a 1 hour Baseline study at the beginning.  POLYSOMNOGRAPHY IMPRESSION :   Complex Sleep Apnea (CSA) was not significantly improved by BiPAP .The oxygen level improved  Central Sleep Apnea   abnormal EKG- chronic a fib.  Fragmented sleep, with and without BiPAP.  RECOMMENDATIONS: Use of BIPAP is optional, it will not influence the cardiac arrhythmia once chronic. There was a marginal benefit with the use of PAP, especially for the hypoxemia. No obstructive apnea of significance. She continues to use BipAP- she can lower the pressure.  [name], M.D.   08-05-2018  Medical Director, [facility] at [facility]   Mrs. Coatney reports that her heart rate has been slower and her blood pressure has been measurably lower, while she remains in chronic atrial fibrillation.  She is on a calcium channel blocker, and a beta-blocker.  She has used her BiPAP at the current settings of 20/15 centimeters since the results of  the last sleep study did not yet lead to a result.  Problems.  She is currently using a pressure that I ordered at 9/5 cmH2O.  I also asked for 30-day download on those results for sleep study as quoted above and she has been showing interest in using the BiPAP just because it does not help her fatigue to have her oxygen levels elevated.  It has had a very marginal effect on the overall AHI.  Her AHI was 5.6/h and would not require necessarily positive airway therapy.   She endorsed today the fatigue severity score at only 19 points which is unusually low for her and her Epworth Sleepiness Scale at 7 points which is also lower than usual.   She has lost so much weight that she no longer applies to the risk of obesity hypoventilation and I congratulated her for her extraordinary efforts given her limited exercise capacity.    History of present illness :Mrs. Barresi  has been seen in this office for MS for 30 years, was diagnosed by Dr. Rosiland Oz in [phone]. Mr. Winger had several days of severe spasms, feeling tight, and she was unable to transfer, to walk with her walker. At baseline she can walk 40-50 feet. She was asked to used acthar 2 days in a row, and it seems to help, she has 6 more to go. She did not feel any improvement after taking baclofen.  She has been successful in losing a lot of weight under Dr. Migdalia Dk medical weight management program, at that clinic she needs to stay and stand still on a special scale which allows the differentiation between total weight, water weight fluid and fat content.  She is not able right now to stand still enough to allow this machine to work.  However her weight loss has been so significant that she has some loose skin now up to 18 her current weight is 218 pounds and this will certainly support her apnea treatment.  Orders the patient is here followed also for complex and central sleep apnea, the control had been important as she also developed atrial fibrillation  followed by Dr. Debara Pickett.   12-19-2017,  RV . Follow up visit with [name], better known as [name], established 76 year old Caucasian female patient with long-standing history of MS.  She just celebrated her 19th wedding anniversary with her husband.  The patient has been followed for a prescription pump and fill by Dr. [name] she has an appointment on April 11 and at that time her baclofen rate was increased to 799.5 mcg/day, for a day if she felt floppy and weak.  The very next day the dose was reduced to 700.3 mcg a day and since then she has been doing okay.  She still uses actor, she still follows with Dr. Leafy Ro at the medical weight management clinic.  Dr. Debara Pickett is her cardiologist, Dr. Dagmar Hait her primary care physician. The patient most recently saw Dr. Leafy Ro she was unable to step up to the scale and told safely on.  Dr. Leafy Ro obtain a metabolic panel which showed hypokalemia we will review this in detail later on today's visit, she has been started on potassium supplements.   Date , MM: Ms. Co is a 76 year old female with a history of multiple sclerosis and obstructive sleep apnea on BiPAP.   she returns today for follow-up.  Her BiPAP indicates that she use her machine 30 out of 30 days for compliance of 100%.  She use her machine greater than 4 hours 28 out of 30 days for compliance of 93%.  On average she uses her machine 7 hours and 14 minutes.  Her IPAP is 20 cm of water and EPAP pressure of 15 cm of water her residual AHI is 16.7.  She does not have a significant leak.  The patient reports that she has been seeing Dr. Leafy Ro for weight loss.  Reports that she is lost approximately 30 pounds.  She has an appointment this coming Wednesday.  The patient denies any new numbness or weakness does feel like her hands is getting gradually weaker.  She states that she does participate in physical therapy 1 time a week but then does her own exercises 3 times a week.  She uses a wheelchair  primarily.  She reports that she has several more months of Betaseron left but once her prescription runs out she plans to stop this medication. She reports that taking Acthar works better for her. She reports that she has been on Copaxone and Avonex but never any of the oral medications. She returns today for an evaluation.  My long-standing patient Afrika Brick has struggled trying to control her complex sleep apneas , which are central and obstructive in nature. She has started on a BiPAP setting of 20/15 cm water and has been 100% compliance for the last 30 days with an average of 7 hours and 42 minutes. The residual AHI is still 13.9 area advanced home care did call her and told her that there with 3 days consecutively during which her AHI was markedly reduced.This occurred during May, and it related to Mrs. Kanzler being given at double dose of diuretics , while she was at [facility] rehab. It was most nights after a high diuretic was given that she slept the best and happily with congestive central apnea. In the meantime, her cardiologist, Dr. Debara Pickett, has increased Lasix from 60-80 mg daily but she has not had the same effect as she had at 100 mg daily. She is continuously in a fib now. No ablation as long as apnea is not treated.    Dr. Leafy Ro did an extensive  lab panel,  Good HB a1c. She was low on protein- and she sees a nutritionist. [name] is now cooking for her. Lots of chicken and vegetables- and she enjoys the food.  MS stable on Acthar. No diabetes on acthar and no relapses into acute a fib. Melatonin helps sleep. Given that Mrs. Basinski has suffered from [place] for almost 30 years now.     REVIEW OF SYSTEMS: Out of a complete 14 system review of symptoms, the patient complains only of the following symptoms, and all other reviewed systems are negative. Ankle and lag edema, Urinary urge. She uses a BiPAP - 20-15 cm water. She endorsed today the fatigue severity score at only 19 points which is unusually  low for her and her Epworth Sleepiness Scale at 7 points which is also lower than usual.   She has lost so much weight that she no longer applies to the risk of obesity hypoventilation and I congratulated her for her extraordinary efforts given her limited exercise capacity. Hemorrhoids, stool elimination is incomplete. Stool is soft. Abdominal pain,vagal feeling - gastroparesis? .    ALLERGIES: Allergies  Allergen Reactions   Other Rash    Topical Surgical prep  - severe rash   Chlorhexidine Hives and Other (See Comments)    And, sores appear where applied   Hydrocodone Nausea And Vomiting and Other (See Comments)    Hydrocodone causes vomiting Hydrocodone causes vomiting   Oxycodone Nausea And Vomiting and Other (See Comments)    Oral oxycodone causes vomiting   Sertraline Hives, Swelling, Rash and Other (See Comments)    [DERM][OTHER]RASH WITH SWELLING   Macrobid [Nitrofurantoin]     Other reaction(s): 3.9.17 nausea and vomitting,   Phenol Hives   Rofecoxib     Other reaction(s): Unknown    HOME MEDICATIONS: Outpatient Medications Prior to Visit  Medication Sig Dispense Refill   acetaminophen (TYLENOL) 500 MG tablet Take 1,000 mg by mouth every 6 (six) hours as needed (fever/headache/pain).     ALPRAZolam (XANAX) 0.5 MG tablet TAKE 1 TABLET BY MOUTH 3  TIMES DAILY AS NEEDED FOR  ANXIETY AND SLEEP (Patient taking differently: Take 0.5 mg by mouth 3 (three) times daily as needed for anxiety or sleep.) 90 tablet 1   ARTIFICIAL TEARS 0.2-0.2-1 % SOLN Apply to eye.     Ascorbic Acid (VITAMIN C) 1000 MG tablet Take 1,000 mg by mouth 4 (four) times daily.     Biotin 10000 MCG TABS Take 10,000 mcg by mouth daily after lunch.     buPROPion (WELLBUTRIN XL) 150 MG 24 hr tablet Take 1 tablet (150 mg total) by mouth daily. (Patient taking differently: Take 150 mg by mouth daily. After breakfest) 90 tablet 1   Calcium Carbonate-Vitamin D3 600-400 MG-UNIT TABS Take 1 tablet by mouth in the  morning and at bedtime. (After breakfast & after lunch)     cetirizine (ZYRTEC) 10 MG tablet Take 10 mg by mouth daily as needed for allergies.      Cholecalciferol (VITAMIN D3) 50 MCG (2000 UT) TABS Take 2,000 Units by mouth in the morning and at bedtime. (After breakfast & after lunch)     ciprofloxacin (CIPRO) 500 MG tablet Take 500 mg by mouth 2 (two) times daily. For 7 days     Coenzyme Q10 (COQ10 PO) Take by mouth daily in the afternoon.     corticotropin (ACTHAR) 80 UNIT/ML injectable gel inject 1 ml into the muscle every other day for 10 days each month  diclofenac Sodium (VOLTAREN) 1 % GEL Apply 2 g topically 3 (three) times daily as needed. (Patient taking differently: Apply 2 g topically 3 (three) times daily as needed (pain (applied to bilateral shoulders)).) 100 g 3   diltiazem (CARDIZEM CD) 240 MG 24 hr capsule Take 240 mg by mouth daily.     docusate sodium (COLACE) 100 MG capsule Take 1 capsule (100 mg total) by mouth daily. (Patient taking differently: Take 100 mg by mouth 2 (two) times daily.) 10 capsule 0   escitalopram (LEXAPRO) 10 MG tablet Take 1 tablet (10 mg total) by mouth daily. (Patient taking differently: Take 10 mg by mouth at bedtime.) 90 tablet 1   famotidine (PEPCID) 20 MG tablet Take 20 mg by mouth daily.     furosemide (LASIX) 20 MG tablet Take 2 tablets (40 mg total) by mouth in the morning, at noon, and at bedtime. (Patient taking differently: Take 60 mg by mouth 2 (two) times daily.) 30 tablet 0   gabapentin (NEURONTIN) 300 MG capsule Mrs. Skyles will take 300 mg of gabapentin at 6 AM, 10 AM, 2 PM and 6 PM and 600 mg at 10 PM by mouth. 180 capsule 11   lactobacillus acidophilus (BACID) TABS tablet Take 1 tablet by mouth 2 (two) times daily as needed.     levothyroxine (SYNTHROID, LEVOTHROID) 75 MCG tablet Take 75 mcg by mouth daily before breakfast. (0500)     linaclotide (LINZESS) 145 MCG CAPS capsule Take 1 capsule (145 mcg total) by mouth daily before  breakfast. (Patient taking differently: Take 145 mcg by mouth daily before breakfast. (0500)) 90 capsule 3   melatonin 5 MG TABS Take 5 mg by mouth at bedtime.     Menthol-Zinc Oxide (REMEDY CALAZIME) 0.4-20.5 % PSTE Apply 1 application topically 3 (three) times daily as needed (buttocks/sacrum with incontinences care episodes).     methenamine (HIPREX) 1 g tablet Take 1 g by mouth 2 (two) times daily with a meal.     Multiple Vitamin (MULTIVITAMIN WITH MINERALS) TABS tablet Take 1 tablet by mouth daily after lunch.     neomycin-bacitracin-polymyxin (NEOSPORIN) ointment Apply 1 application topically 2 (two) times daily as needed for wound care (applied to nose (wear mask rubs)).     NON FORMULARY ASV breathing machine is to be used while napping & at bedtime. Fill canister up to max line with distilled water and turn concentrator to 5LPM.     NYAMYC powder Apply topically.     omeprazole (PRILOSEC) 20 MG capsule Take 20 mg by mouth daily before breakfast. (0500)     ondansetron (ZOFRAN) 8 MG tablet Take 8 mg by mouth daily as needed for nausea or vomiting.     oxybutynin (DITROPAN) 5 MG tablet Take 5 mg by mouth 3 (three) times daily as needed for bladder spasms.     phenazopyridine (PYRIDIUM) 100 MG tablet Take 100 mg by mouth 3 (three) times daily.     potassium chloride SA (KLOR-CON) 20 MEQ tablet Take 2 tablets (40 mEq total) by mouth daily. (Patient taking differently: Take 20 mEq by mouth 2 (two) times daily. (After lunch & after dinner)) 180 tablet 3   psyllium (METAMUCIL SMOOTH TEXTURE) 28 % packet Take 1 packet by mouth at bedtime.     simvastatin (ZOCOR) 10 MG tablet Take 10 mg by mouth at bedtime.     SYRINGE/NEEDLE, DISP, 1 ML (BD LUER-LOK SYRINGE) 25G X 5/8" 1 ML MISC Use as directed to inject  Acthar  10 each prn   warfarin (COUMADIN) 4 MG tablet Take 4 mg by mouth daily.     zinc oxide 20 % ointment Apply 1 application topically as needed for irritation (apply labia every shift as  needed for redness).     No facility-administered medications prior to visit.    PAST MEDICAL HISTORY: Past Medical History:  Diagnosis Date   Abnormality of gait 03/01/2015   Anxiety    Arthritis    "knees" (01/19/2016)   Asthma    as a child    Back pain    Bilateral carpal tunnel syndrome 10/04/2020   Cancer ([place])    / RENAL CELL CARCINOMA - GETS CT SCAN EVERY 6 MONTHS TO WATCH    CHF (congestive heart failure) (HCC)    Chronic pain    "nerve pain from the MS"   Complex atypical endometrial hyperplasia    Constipation    Depression    DVT (deep venous thrombosis) ([place]) 1983   "RLE; may have just been phlebitis; it was before the age of dopplers"   Dysrhythmia    afib    Edema    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' ablation of R GSV 2012   GERD (gastroesophageal reflux disease)    HLD (hyperlipidemia)    Hypertension    Hypotension    Hypothyroidism    Joint pain    Multiple sclerosis ([place])    has had this may 1989-[place] reg doc   OSA on CPAP    bipap machine - SLEEP APNEA WORSE SINCE COVID IN 1/21 PER PATIENT SETTING HAS INCREASED FROM 9 TO 30    Osteoarthritis    PAF (paroxysmal atrial fibrillation) ([place])    on coumadin; documented on monitor 06/2010 - no longer takes per [facility] 02/09/21   Pneumonia 11/2011   Sleep apnea    uses sleep apnea machine    PAST SURGICAL HISTORY: Past Surgical History:  Procedure Laterality Date   ANTERIOR CERVICAL DECOMP/DISCECTOMY FUSION  01/2004   CARDIAC CATHETERIZATION  06/22/2010   normal coronary arteries, PAF   CATARACT EXTRACTION, BILATERAL Bilateral    CHILD BIRTHS     X2   DILATATION & CURETTAGE/HYSTEROSCOPY WITH MYOSURE N/A 01/15/2020   Procedure: DILATATION & CURETTAGE/HYSTEROSCOPY WITH MYOSURE;  Surgeon: Arvella Nigh, MD;  Location: [facility];  Service: Gynecology;  Laterality: N/A;  pt in wheelchair-has MS   DILATION AND CURETTAGE OF UTERUS     DILATION AND CURETTAGE OF UTERUS N/A 02/16/2020    Procedure: DILATATION AND CURETTAGE;  Surgeon: Lafonda Mosses, MD;  Location: [facility];  Service: Gynecology;  Laterality: N/A;  NOT GENERAL -NO INTUBATION   INFUSION PUMP IMPLANTATION  ~ 2009   baclofen infusion in lower abd   INTRATHECAL PUMP REVISION N/A 02/18/2021   Procedure: Baclofen Pump replacement with possible revision of catheter;  Surgeon: Erline Levine, MD;  Location: [place];  Service: Neurosurgery;  Laterality: N/A;  3C/RM 20   INTRAUTERINE DEVICE (IUD) INSERTION N/A 02/16/2020   Procedure: INTRAUTERINE DEVICE (IUD) INSERTION - MIRENA;  Surgeon: Lafonda Mosses, MD;  Location: [facility];  Service: Gynecology;  Laterality: N/A;   PAIN PUMP REVISION N/A 07/29/2014   Procedure: Baclofen pump replacement;  Surgeon: Erline Levine, MD;  Location: [place] [facility];  Service: Neurosurgery;  Laterality: N/A;  Baclofen pump replacement   TONSILLECTOMY AND ADENOIDECTOMY     VARICOSE VEIN SURGERY  ~ [place]  12/16/2010  radiofreq ablation -Dr Elisabeth Cara and [facility]   WISDOM TOOTH EXTRACTION      FAMILY HISTORY: Family History  Problem Relation Age of Onset   Coronary artery disease Father        at age 82   Hyperlipidemia Father    Thyroid disease Father    Coronary artery disease Maternal Grandmother    Depression Maternal Grandmother    Cancer Paternal Grandmother        breast   Depression Mother    Sudden death Mother    Anxiety disorder Mother    Alcoholism Son     SOCIAL HISTORY: Social History   Socioeconomic History   Marital status: Married    Spouse name: [name]   Number of children: 2   Years of education: COLLEGE   Highest education level: Not on file  Occupational History   Occupation: RETIRED  Scientist, product/process development strain: Not on file   Food insecurity:    Worry: Not on file    Inability: Not on file   Transportation needs:    Medical: Not on file    Non-medical: Not on file  Tobacco Use   Smoking status: Former Smoker     Packs/day: 0.50    Years: 7.00    Pack years: 3.50    Types: Cigarettes    Last attempt to quit: 08/21/1976    Years since quitting: 42.0   Smokeless tobacco: Never Used  Substance and Sexual Activity   Alcohol use: Yes    Alcohol/week: 1.0 standard drinks    Types: 1 Glasses of wine per week   Drug use: No   Sexual activity: Not Currently  Lifestyle   Physical activity:    Days per week: Not on file    Minutes per session: Not on file   Stress: Not on file  Relationships   Social connections:    Talks on phone: Not on file    Gets together: Not on file    Attends religious service: Not on file    Active member of club or organization: Not on file    Attends meetings of clubs or organizations: Not on file    Relationship status: Not on file   Intimate partner violence:    Fear of current or ex partner: Not on file    Emotionally abused: Not on file    Physically abused: Not on file    Forced sexual activity: Not on file  Other Topics Concern   Not on file  Social History Narrative   Lives in [place] with Husband [name] [phone], [phone]   Currently in PT - [facility] , [place] [address]     PHYSICAL EXAM  Vitals:   09/12/21 1529  BP: 102/68  Pulse: 71  SpO2: 96%  Weight: 193 lb (87.5 kg)  Height: 5\' 3"  (1.6 m)   Body mass index is 34.19 kg/m.  Generalized:  in no acute distress, wheelchair    irregular heart beat, atrial  fibrillation.  Wheelchair bound but able to transfer. Excess skin after weight loss, chin and upper arms- bilateral edema up to the knees. Broken blood vessels.  Lungs are clear to auscultation.   Neurological examination :  Mentation: Alert oriented to time, place, history taking. Follows all commands speech and language fluent, good memory. She has noted word finding slowing, and slurring. Has to deliberately speak slow.  Cranial nerve : Pupils were equal round reactive to light.  Extraocular movements were full,  visual field were full  on confrontational test. Facial sensation and strength were normal. Tongue in midline, bo fasciculations. .  Head turning and shoulder shrug  were normal and symmetric. Motor: The patient cannot fully extend her right upper extremity at the elbow, but she has good biceps flexion strength. Grip strength is weak and bilaterally there is bilaterally significant atrophy of the thenar eminence noted.  She has not just flattening of the eminence, but a groove,   SENSORY_ numbness, unable to do crossword puzzles and write letters. Her fingers feel stiff. She noted an abdominal and chest spasm, she is unable right now to walk has lower back pain and feels generalized weak and heavy. After has given her some energy back.   As to sensory she noticed ongoing numbness in the fingers of both hands the pounds but no numbness in the legs.  Loss of sensation at the feet and ankle was related to the significant edema.  We did ambulate today with a walker and watched her transfers- and we  did upper extremities exercises.     DIAGNOSTIC DATA (LABS, IMAGING, TESTING) - I reviewed patient records, labs, notes, testing and imaging myself where available.  Lab Results  Component Value Date   WBC 5.9 02/18/2021   HGB 14.3 02/18/2021   HCT 42.7 02/18/2021   MCV 93.8 02/18/2021   PLT 176 02/18/2021      Component Value Date/Time   NA 136 02/18/2021 1047   NA 144 12/15/2020 1100   K 3.9 02/18/2021 1047   CL 95 (L) 02/18/2021 1047   CO2 33 (H) 02/18/2021 1047   GLUCOSE 87 02/18/2021 1047   BUN 19 02/18/2021 1047   BUN 27 12/15/2020 1100   CREATININE 0.54 02/18/2021 1047   CALCIUM 9.1 02/18/2021 1047   PROT 5.9 (L) 01/12/2021 0331   PROT 5.9 (L) 09/20/2020 1436   ALBUMIN 3.5 01/12/2021 0331   ALBUMIN 3.9 09/20/2020 1436   AST 17 01/12/2021 0331   ALT 15 01/12/2021 0331   ALKPHOS 56 01/12/2021 0331   BILITOT 0.4 01/12/2021 0331   BILITOT 0.3 09/20/2020 1436   GFRNONAA >60 02/18/2021 1047   GFRAA 90  09/20/2020 1436   Lab Results  Component Value Date   CHOL 111 03/19/2019   HDL 57 03/19/2019   LDLCALC 43 03/19/2019   TRIG 53 03/19/2019   CHOLHDL 3.0 04/22/2010   Lab Results  Component Value Date   HGBA1C 4.9 06/05/2020   No results found for: VITAMINB12 Lab Results  Component Value Date   TSH 2.961 01/03/2021      ASSESSMENT AND PLAN 76 y.o. year old female  has a past medical history of Abnormality of gait (03/01/2015), Anxiety, Arthritis, Asthma, Back pain, Bilateral carpal tunnel syndrome (10/04/2020), Cancer ([place]), CHF (congestive heart failure) ([place]), Chronic pain, Complex atypical endometrial hyperplasia, Constipation, Depression, DVT (deep venous thrombosis) (HCC) (1983), Dysrhythmia, Edema, GERD (gastroesophageal reflux disease), HLD (hyperlipidemia), Hypertension, Hypotension, Hypothyroidism, Joint pain, Multiple sclerosis ([place]), OSA on CPAP, Osteoarthritis, PAF (paroxysmal atrial fibrillation) ([place]), Pneumonia (11/2011), and Sleep apnea. here with:  1.  Severe sleepiness  since Covid, needs 3-5 liters on oxygen. AHI residual was 27.8,   sleep apnea on BiPAP-no longer enough central apneas to cause her atrial fib, but we were unable to control her apnea and she is therefor not a cardioversion candidate.    New machine is an ASV to EPAP 12 cm , min PS 3 and max PS of 13 cm water, with 5  liters of oxygen- feels better , wakes up more refreshed, but is still daytime sleepy.    Mrs. Keady has been extremely compliant has used at 97% of the last 30 days compliance by time,  residual AHI was better - 10/h. F 20 in medium with small headgear- .   2.  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 Steele Sizer is brought to the rehab home- by her husband  who gave her the injections, too. I will print the next prescription.  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  She needs cath , and twice daily to  eliminate residual.  3. 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 On ASV  4. Atrial fibrillation,chronic and persistent. CHF -  5. Chronic constipation on baclofen.    Visit was 40 minutes long and addressed 5 problems.    [name], MD  09/12/2021, 4:01 PM [facility] [address], [place], [place] [address] [phone]

## 2021-09-14 DIAGNOSIS — B372 Candidiasis of skin and nail: Secondary | ICD-10-CM | POA: Diagnosis not present

## 2021-09-14 DIAGNOSIS — N390 Urinary tract infection, site not specified: Secondary | ICD-10-CM | POA: Diagnosis not present

## 2021-09-14 DIAGNOSIS — R110 Nausea: Secondary | ICD-10-CM | POA: Diagnosis not present

## 2021-09-14 DIAGNOSIS — Z87440 Personal history of urinary (tract) infections: Secondary | ICD-10-CM | POA: Diagnosis not present

## 2021-09-14 DIAGNOSIS — Z7901 Long term (current) use of anticoagulants: Secondary | ICD-10-CM | POA: Diagnosis not present

## 2021-09-14 DIAGNOSIS — E039 Hypothyroidism, unspecified: Secondary | ICD-10-CM | POA: Diagnosis not present

## 2021-09-14 DIAGNOSIS — R609 Edema, unspecified: Secondary | ICD-10-CM | POA: Diagnosis not present

## 2021-09-14 DIAGNOSIS — G8929 Other chronic pain: Secondary | ICD-10-CM | POA: Diagnosis not present

## 2021-09-14 DIAGNOSIS — K5900 Constipation, unspecified: Secondary | ICD-10-CM | POA: Diagnosis not present

## 2021-09-14 DIAGNOSIS — K219 Gastro-esophageal reflux disease without esophagitis: Secondary | ICD-10-CM | POA: Diagnosis not present

## 2021-09-14 DIAGNOSIS — I509 Heart failure, unspecified: Secondary | ICD-10-CM | POA: Diagnosis not present

## 2021-09-14 DIAGNOSIS — L258 Unspecified contact dermatitis due to other agents: Secondary | ICD-10-CM | POA: Diagnosis not present

## 2021-09-14 DIAGNOSIS — E785 Hyperlipidemia, unspecified: Secondary | ICD-10-CM | POA: Diagnosis not present

## 2021-09-14 DIAGNOSIS — N319 Neuromuscular dysfunction of bladder, unspecified: Secondary | ICD-10-CM | POA: Diagnosis not present

## 2021-09-14 DIAGNOSIS — G35 Multiple sclerosis: Secondary | ICD-10-CM | POA: Diagnosis not present

## 2021-09-15 DIAGNOSIS — R110 Nausea: Secondary | ICD-10-CM | POA: Diagnosis not present

## 2021-09-15 DIAGNOSIS — E559 Vitamin D deficiency, unspecified: Secondary | ICD-10-CM | POA: Diagnosis not present

## 2021-09-15 DIAGNOSIS — G35 Multiple sclerosis: Secondary | ICD-10-CM | POA: Diagnosis not present

## 2021-09-15 DIAGNOSIS — R609 Edema, unspecified: Secondary | ICD-10-CM | POA: Diagnosis not present

## 2021-09-15 DIAGNOSIS — G4700 Insomnia, unspecified: Secondary | ICD-10-CM | POA: Diagnosis not present

## 2021-09-15 DIAGNOSIS — K5900 Constipation, unspecified: Secondary | ICD-10-CM | POA: Diagnosis not present

## 2021-09-15 DIAGNOSIS — N390 Urinary tract infection, site not specified: Secondary | ICD-10-CM | POA: Diagnosis not present

## 2021-09-15 DIAGNOSIS — B372 Candidiasis of skin and nail: Secondary | ICD-10-CM | POA: Diagnosis not present

## 2021-09-15 DIAGNOSIS — R5381 Other malaise: Secondary | ICD-10-CM | POA: Diagnosis not present

## 2021-09-15 DIAGNOSIS — N3289 Other specified disorders of bladder: Secondary | ICD-10-CM | POA: Diagnosis not present

## 2021-09-16 DIAGNOSIS — Z7901 Long term (current) use of anticoagulants: Secondary | ICD-10-CM | POA: Diagnosis not present

## 2021-09-23 DIAGNOSIS — C539 Malignant neoplasm of cervix uteri, unspecified: Secondary | ICD-10-CM | POA: Diagnosis not present

## 2021-09-23 DIAGNOSIS — R338 Other retention of urine: Secondary | ICD-10-CM | POA: Diagnosis not present

## 2021-09-23 DIAGNOSIS — N3020 Other chronic cystitis without hematuria: Secondary | ICD-10-CM | POA: Diagnosis not present

## 2021-09-24 DIAGNOSIS — C649 Malignant neoplasm of unspecified kidney, except renal pelvis: Secondary | ICD-10-CM | POA: Diagnosis not present

## 2021-09-24 DIAGNOSIS — E039 Hypothyroidism, unspecified: Secondary | ICD-10-CM | POA: Diagnosis not present

## 2021-09-24 DIAGNOSIS — E559 Vitamin D deficiency, unspecified: Secondary | ICD-10-CM | POA: Diagnosis not present

## 2021-09-24 DIAGNOSIS — I4891 Unspecified atrial fibrillation: Secondary | ICD-10-CM | POA: Diagnosis not present

## 2021-09-24 DIAGNOSIS — E785 Hyperlipidemia, unspecified: Secondary | ICD-10-CM | POA: Diagnosis not present

## 2021-09-24 DIAGNOSIS — M1990 Unspecified osteoarthritis, unspecified site: Secondary | ICD-10-CM | POA: Diagnosis not present

## 2021-09-24 DIAGNOSIS — I509 Heart failure, unspecified: Secondary | ICD-10-CM | POA: Diagnosis not present

## 2021-09-27 DIAGNOSIS — I4891 Unspecified atrial fibrillation: Secondary | ICD-10-CM | POA: Diagnosis not present

## 2021-09-27 DIAGNOSIS — Z7901 Long term (current) use of anticoagulants: Secondary | ICD-10-CM | POA: Diagnosis not present

## 2021-09-28 ENCOUNTER — Non-Acute Institutional Stay: Payer: Medicare Other | Admitting: Family Medicine

## 2021-09-28 ENCOUNTER — Other Ambulatory Visit: Payer: Self-pay

## 2021-09-28 ENCOUNTER — Encounter: Payer: Self-pay | Admitting: Family Medicine

## 2021-09-28 VITALS — HR 72 | Temp 97.3°F | Resp 18 | Wt 193.0 lb

## 2021-09-28 DIAGNOSIS — N3000 Acute cystitis without hematuria: Secondary | ICD-10-CM

## 2021-09-28 DIAGNOSIS — Z515 Encounter for palliative care: Secondary | ICD-10-CM | POA: Diagnosis not present

## 2021-09-28 DIAGNOSIS — I4821 Permanent atrial fibrillation: Secondary | ICD-10-CM

## 2021-09-28 DIAGNOSIS — Z9981 Dependence on supplemental oxygen: Secondary | ICD-10-CM | POA: Diagnosis not present

## 2021-09-28 DIAGNOSIS — K219 Gastro-esophageal reflux disease without esophagitis: Secondary | ICD-10-CM | POA: Diagnosis not present

## 2021-09-28 DIAGNOSIS — N390 Urinary tract infection, site not specified: Secondary | ICD-10-CM | POA: Diagnosis not present

## 2021-09-28 DIAGNOSIS — G35 Multiple sclerosis: Secondary | ICD-10-CM | POA: Diagnosis not present

## 2021-09-28 DIAGNOSIS — C649 Malignant neoplasm of unspecified kidney, except renal pelvis: Secondary | ICD-10-CM | POA: Diagnosis not present

## 2021-09-28 DIAGNOSIS — B372 Candidiasis of skin and nail: Secondary | ICD-10-CM | POA: Diagnosis not present

## 2021-09-28 DIAGNOSIS — L258 Unspecified contact dermatitis due to other agents: Secondary | ICD-10-CM | POA: Diagnosis not present

## 2021-09-28 DIAGNOSIS — R609 Edema, unspecified: Secondary | ICD-10-CM | POA: Diagnosis not present

## 2021-09-28 DIAGNOSIS — K5900 Constipation, unspecified: Secondary | ICD-10-CM | POA: Diagnosis not present

## 2021-09-28 DIAGNOSIS — B379 Candidiasis, unspecified: Secondary | ICD-10-CM | POA: Diagnosis not present

## 2021-09-28 DIAGNOSIS — E785 Hyperlipidemia, unspecified: Secondary | ICD-10-CM | POA: Diagnosis not present

## 2021-09-28 DIAGNOSIS — I4891 Unspecified atrial fibrillation: Secondary | ICD-10-CM | POA: Diagnosis not present

## 2021-09-28 DIAGNOSIS — G4700 Insomnia, unspecified: Secondary | ICD-10-CM | POA: Diagnosis not present

## 2021-09-28 NOTE — Progress Notes (Signed)
[facility] Palliative Care Consult Note Telephone: [phone]  Fax: [phone]    Date of encounter: 09/28/21 12:10 PM PATIENT NAME: Toni [name] [id number] [place] [address]   [phone] (home)  DOB: [birth year] MRN: [id number] PRIMARY CARE PROVIDER:    Jodi Marble, MD,  [place] [address] [phone]  REFERRING PROVIDER:   Jodi Marble, MD [address] [place],  [place] [address] [phone]  RESPONSIBLE PARTY:    Contact Information     Name Relation Home Work [place] Daughter [phone]  [phone]   Toni, [name] [phone]  [phone]        I met face to face with patient and her caregiver Toni [name] in [facility] facility. Palliative Care was asked to follow this patient by consultation request of  Toni Marble, MD to address advance care planning and complex medical decision making. This is a follow up visit.                                   ASSESSMENT, SYMPTOM MANAGEMENT AND PLAN / RECOMMENDATIONS:   Palliative Care Encounter Has advanced directive, need to address goals of care on next visit. Daughter Toni [name] is [name] POA followed by pt's spouse.  2.    Acute Cystitis without hematuria Has Recurrent UTI likely as secondary effect of MS (neurogenic bladder) and incontinence Currently on antibiotic therapy-Trimethoprim 100 mg daily for 90 days, started 09/20/2021 per Urogynecologist from [facility].  3.    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  5.    Permanent atrial fibrillation Rate controlled on Diltiazem 240 mg daily, anticaogulated on Coumadin with no evidence of bleeding.       Advance Care Planning/Goals of Care: Goals include to maximize quality of life and symptom management.  The value and importance of advance care  planning -has HC POA and advance directives. Identification of a healthcare agent -Daughter Toni [name] is [facility] POA followed by spouse  CODE STATUS:  Full code    Follow up Palliative Care Visit: Palliative care will continue to follow for complex medical decision making, advance care planning, and clarification of goals. Return 4 weeks or prn.   This visit was coded based on medical decision making (MDM).  PPS: 40%  HOSPICE ELIGIBILITY/DIAGNOSIS: TBD  Chief Complaint:  [name] is following up with patient for chronic disease management with secondary progressive MS, advance directive and defining/refining goals of care.  HISTORY OF PRESENT ILLNESS:  Toni [name] is a 76 y.o. year old female with secondary progressive MS and nausea which has been present since she got Covid in Dec/Jan 2020.  She also has Zofran that she takes and helps manage nausea.  Toni [name], her caregiver is present at the time of exam. She has been having fever, dysuria and increased frequency.  Has been seen by Urogynecologist who has prescribe Trimethoprim 100 mg daily x 90 days. Reports sleep is ok, appetite is "ok", antidepressant "helping" with mood.  Has some "nerve pain in my legs" but takes Gabapentin. 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  History obtained from review of EMR, discussion with primary team, and interview witr Toni [name]. I reviewed  available labs, medications, imaging, studies and related documents from the EMR.  Records reviewed and summarized above.   ROS  General: NAD EYES: denies vision changes ENMT: denies dysphagia Cardiovascular: denies chest pain, orthopnea or PND Pulmonary: denies cough, denies increased SOB Abdomen: endorses fair appetite and constipation, endorses incontinence of bowel GU:  endorses dysuria, incontinence of urine MSK:  endorses increased weakness,  no falls reported Skin: denies  rashes or wounds Neurological: denies insomnia, c/o leg pain Psych: Endorses blunted mood Heme/lymph/immuno: denies bruises, abnormal bleeding  Physical Exam: Current and past weights: 193 lbs as of today Constitutional: NAD, chronically ill appearing General: obese  EYES: anicteric sclera, lids intact, no discharge  ENMT: intact hearing, oral mucous membranes moist, dentition intact CV: S1S2, IRIR, 1+ non-pitting edema BLE Pulmonary: CTAB, no increased work of breathing, no cough, room air Abdomen: hypo-active BS + 4 quadrants, soft and non tender, no ascites GU: deferred MSK: no sarcopenia, moves all extremities, minimally ambulatory with much assistance Skin: warm and dry, no rashes or wounds on visible skin Neuro:  no generalized weakness,  no cognitive impairment Psych: non-anxious, blunted affect, A and O x 3 Hem/lymph/immuno: no widespread bruising   Thank you for the opportunity to participate in the care of Toni [name].  The palliative care team will continue to follow. Please call our office at [phone] if we can be of additional assistance.   Marijo Conception, FNP   COVID-19 PATIENT SCREENING TOOL Asked and negative response unless otherwise noted:   Have you had symptoms of covid, tested positive or been in contact with someone with symptoms/positive test in the past 5-10 days?  No

## 2021-09-29 DIAGNOSIS — Z7901 Long term (current) use of anticoagulants: Secondary | ICD-10-CM | POA: Diagnosis not present

## 2021-10-01 DIAGNOSIS — Z7901 Long term (current) use of anticoagulants: Secondary | ICD-10-CM | POA: Diagnosis not present

## 2021-10-03 DIAGNOSIS — Z7901 Long term (current) use of anticoagulants: Secondary | ICD-10-CM | POA: Diagnosis not present

## 2021-10-06 DIAGNOSIS — G35 Multiple sclerosis: Secondary | ICD-10-CM | POA: Diagnosis not present

## 2021-10-06 DIAGNOSIS — R279 Unspecified lack of coordination: Secondary | ICD-10-CM | POA: Diagnosis not present

## 2021-10-06 DIAGNOSIS — M25552 Pain in left hip: Secondary | ICD-10-CM | POA: Diagnosis not present

## 2021-10-11 DIAGNOSIS — Z7901 Long term (current) use of anticoagulants: Secondary | ICD-10-CM | POA: Diagnosis not present

## 2021-10-11 DIAGNOSIS — R279 Unspecified lack of coordination: Secondary | ICD-10-CM | POA: Diagnosis not present

## 2021-10-11 DIAGNOSIS — G35 Multiple sclerosis: Secondary | ICD-10-CM | POA: Diagnosis not present

## 2021-10-12 DIAGNOSIS — R110 Nausea: Secondary | ICD-10-CM | POA: Diagnosis not present

## 2021-10-12 DIAGNOSIS — K5900 Constipation, unspecified: Secondary | ICD-10-CM | POA: Diagnosis not present

## 2021-10-12 DIAGNOSIS — I509 Heart failure, unspecified: Secondary | ICD-10-CM | POA: Diagnosis not present

## 2021-10-12 DIAGNOSIS — G35 Multiple sclerosis: Secondary | ICD-10-CM | POA: Diagnosis not present

## 2021-10-12 DIAGNOSIS — R61 Generalized hyperhidrosis: Secondary | ICD-10-CM | POA: Diagnosis not present

## 2021-10-12 DIAGNOSIS — C649 Malignant neoplasm of unspecified kidney, except renal pelvis: Secondary | ICD-10-CM | POA: Diagnosis not present

## 2021-10-12 DIAGNOSIS — R279 Unspecified lack of coordination: Secondary | ICD-10-CM | POA: Diagnosis not present

## 2021-10-12 DIAGNOSIS — B372 Candidiasis of skin and nail: Secondary | ICD-10-CM | POA: Diagnosis not present

## 2021-10-12 DIAGNOSIS — E039 Hypothyroidism, unspecified: Secondary | ICD-10-CM | POA: Diagnosis not present

## 2021-10-12 DIAGNOSIS — R232 Flushing: Secondary | ICD-10-CM | POA: Diagnosis not present

## 2021-10-12 DIAGNOSIS — N390 Urinary tract infection, site not specified: Secondary | ICD-10-CM | POA: Diagnosis not present

## 2021-10-12 DIAGNOSIS — L258 Unspecified contact dermatitis due to other agents: Secondary | ICD-10-CM | POA: Diagnosis not present

## 2021-10-12 DIAGNOSIS — R5381 Other malaise: Secondary | ICD-10-CM | POA: Diagnosis not present

## 2021-10-13 DIAGNOSIS — Z7901 Long term (current) use of anticoagulants: Secondary | ICD-10-CM | POA: Diagnosis not present

## 2021-10-13 DIAGNOSIS — C649 Malignant neoplasm of unspecified kidney, except renal pelvis: Secondary | ICD-10-CM | POA: Diagnosis not present

## 2021-10-13 DIAGNOSIS — R232 Flushing: Secondary | ICD-10-CM | POA: Diagnosis not present

## 2021-10-13 DIAGNOSIS — R110 Nausea: Secondary | ICD-10-CM | POA: Diagnosis not present

## 2021-10-13 DIAGNOSIS — R279 Unspecified lack of coordination: Secondary | ICD-10-CM | POA: Diagnosis not present

## 2021-10-13 DIAGNOSIS — I509 Heart failure, unspecified: Secondary | ICD-10-CM | POA: Diagnosis not present

## 2021-10-13 DIAGNOSIS — G8929 Other chronic pain: Secondary | ICD-10-CM | POA: Diagnosis not present

## 2021-10-13 DIAGNOSIS — K5900 Constipation, unspecified: Secondary | ICD-10-CM | POA: Diagnosis not present

## 2021-10-13 DIAGNOSIS — E039 Hypothyroidism, unspecified: Secondary | ICD-10-CM | POA: Diagnosis not present

## 2021-10-13 DIAGNOSIS — G35 Multiple sclerosis: Secondary | ICD-10-CM | POA: Diagnosis not present

## 2021-10-13 DIAGNOSIS — R61 Generalized hyperhidrosis: Secondary | ICD-10-CM | POA: Diagnosis not present

## 2021-10-13 DIAGNOSIS — Z79899 Other long term (current) drug therapy: Secondary | ICD-10-CM | POA: Diagnosis not present

## 2021-10-13 DIAGNOSIS — E119 Type 2 diabetes mellitus without complications: Secondary | ICD-10-CM | POA: Diagnosis not present

## 2021-10-13 DIAGNOSIS — L258 Unspecified contact dermatitis due to other agents: Secondary | ICD-10-CM | POA: Diagnosis not present

## 2021-10-13 DIAGNOSIS — E559 Vitamin D deficiency, unspecified: Secondary | ICD-10-CM | POA: Diagnosis not present

## 2021-10-13 DIAGNOSIS — B379 Candidiasis, unspecified: Secondary | ICD-10-CM | POA: Diagnosis not present

## 2021-10-13 DIAGNOSIS — N390 Urinary tract infection, site not specified: Secondary | ICD-10-CM | POA: Diagnosis not present

## 2021-10-17 DIAGNOSIS — E119 Type 2 diabetes mellitus without complications: Secondary | ICD-10-CM | POA: Diagnosis not present

## 2021-10-17 DIAGNOSIS — I10 Essential (primary) hypertension: Secondary | ICD-10-CM | POA: Diagnosis not present

## 2021-10-18 DIAGNOSIS — G35 Multiple sclerosis: Secondary | ICD-10-CM | POA: Diagnosis not present

## 2021-10-18 DIAGNOSIS — R279 Unspecified lack of coordination: Secondary | ICD-10-CM | POA: Diagnosis not present

## 2021-10-18 DIAGNOSIS — E876 Hypokalemia: Secondary | ICD-10-CM | POA: Diagnosis not present

## 2021-10-19 DIAGNOSIS — L258 Unspecified contact dermatitis due to other agents: Secondary | ICD-10-CM | POA: Diagnosis not present

## 2021-10-19 DIAGNOSIS — G35 Multiple sclerosis: Secondary | ICD-10-CM | POA: Diagnosis not present

## 2021-10-19 DIAGNOSIS — I4891 Unspecified atrial fibrillation: Secondary | ICD-10-CM | POA: Diagnosis not present

## 2021-10-19 DIAGNOSIS — R279 Unspecified lack of coordination: Secondary | ICD-10-CM | POA: Diagnosis not present

## 2021-10-19 DIAGNOSIS — G4700 Insomnia, unspecified: Secondary | ICD-10-CM | POA: Diagnosis not present

## 2021-10-19 DIAGNOSIS — R232 Flushing: Secondary | ICD-10-CM | POA: Diagnosis not present

## 2021-10-19 DIAGNOSIS — C649 Malignant neoplasm of unspecified kidney, except renal pelvis: Secondary | ICD-10-CM | POA: Diagnosis not present

## 2021-10-19 DIAGNOSIS — R61 Generalized hyperhidrosis: Secondary | ICD-10-CM | POA: Diagnosis not present

## 2021-10-19 DIAGNOSIS — N390 Urinary tract infection, site not specified: Secondary | ICD-10-CM | POA: Diagnosis not present

## 2021-10-19 DIAGNOSIS — K5900 Constipation, unspecified: Secondary | ICD-10-CM | POA: Diagnosis not present

## 2021-10-19 DIAGNOSIS — E559 Vitamin D deficiency, unspecified: Secondary | ICD-10-CM | POA: Diagnosis not present

## 2021-10-19 DIAGNOSIS — G4733 Obstructive sleep apnea (adult) (pediatric): Secondary | ICD-10-CM | POA: Diagnosis not present

## 2021-10-19 DIAGNOSIS — B379 Candidiasis, unspecified: Secondary | ICD-10-CM | POA: Diagnosis not present

## 2021-10-20 DIAGNOSIS — R279 Unspecified lack of coordination: Secondary | ICD-10-CM | POA: Diagnosis not present

## 2021-10-20 DIAGNOSIS — G35 Multiple sclerosis: Secondary | ICD-10-CM | POA: Diagnosis not present

## 2021-10-20 DIAGNOSIS — Z7901 Long term (current) use of anticoagulants: Secondary | ICD-10-CM | POA: Diagnosis not present

## 2021-10-21 DIAGNOSIS — G35 Multiple sclerosis: Secondary | ICD-10-CM | POA: Diagnosis not present

## 2021-10-21 DIAGNOSIS — R279 Unspecified lack of coordination: Secondary | ICD-10-CM | POA: Diagnosis not present

## 2021-10-22 DIAGNOSIS — E559 Vitamin D deficiency, unspecified: Secondary | ICD-10-CM | POA: Diagnosis not present

## 2021-10-22 DIAGNOSIS — E039 Hypothyroidism, unspecified: Secondary | ICD-10-CM | POA: Diagnosis not present

## 2021-10-22 DIAGNOSIS — M1990 Unspecified osteoarthritis, unspecified site: Secondary | ICD-10-CM | POA: Diagnosis not present

## 2021-10-22 DIAGNOSIS — E785 Hyperlipidemia, unspecified: Secondary | ICD-10-CM | POA: Diagnosis not present

## 2021-10-22 DIAGNOSIS — I4891 Unspecified atrial fibrillation: Secondary | ICD-10-CM | POA: Diagnosis not present

## 2021-10-22 DIAGNOSIS — I509 Heart failure, unspecified: Secondary | ICD-10-CM | POA: Diagnosis not present

## 2021-10-22 DIAGNOSIS — C649 Malignant neoplasm of unspecified kidney, except renal pelvis: Secondary | ICD-10-CM | POA: Diagnosis not present

## 2021-10-24 ENCOUNTER — Telehealth: Payer: Self-pay | Admitting: Neurology

## 2021-10-24 NOTE — Telephone Encounter (Signed)
Called pt back to further discuss. Had a lot of burping/leaking from mask but feels this is better. She is still having times where people have a very hard time to arouse her from sleep. This is not new.  ?For instance, today, she was sitting in recliner, rang for CNA to come get her up to go to bathroom. Fell asleep. Does not feel it was anymore than 15 min she was sleeping. Nurse had to give sternal rub to wake her. Was not wearing ASV. O2 level ok. Been in low 90's but never below 90% per nurses. Nurse gets her up at [place], turns light on and she wakes up. Worried about this ongoing issue. Advised she could be in deep sleep and that could be why she is having a hard time being awakened.  Advised I will f/u with Dr. Brett Fairy tomorrow as she has already left for the evening to make sure she does not have any other recommendations. We will call her back tomorrow. She verbalized understanding.  ?

## 2021-10-24 NOTE — Telephone Encounter (Signed)
Pt has called to report that she has Central Sleep apnea, pt states she is being told that even in a sleep with CPAP or if just from a nap people are having a difficult time waking pt up.  Pt is asking for a call to discuss this. ?

## 2021-10-25 DIAGNOSIS — R279 Unspecified lack of coordination: Secondary | ICD-10-CM | POA: Diagnosis not present

## 2021-10-25 DIAGNOSIS — G35 Multiple sclerosis: Secondary | ICD-10-CM | POA: Diagnosis not present

## 2021-10-25 NOTE — Telephone Encounter (Signed)
Called the patient back and advised that I talked with Dr Brett Fairy and she doesn't feel that there is any concerns in relation to sleep apnea. She agrees that it was most likely she was on a deep stage of sleep. The patient states that she is just noting that this is happening more and more often. She was appreciative for the call back and had no other questions.  ?

## 2021-10-26 DIAGNOSIS — Z7901 Long term (current) use of anticoagulants: Secondary | ICD-10-CM | POA: Diagnosis not present

## 2021-10-27 DIAGNOSIS — R279 Unspecified lack of coordination: Secondary | ICD-10-CM | POA: Diagnosis not present

## 2021-10-27 DIAGNOSIS — G35 Multiple sclerosis: Secondary | ICD-10-CM | POA: Diagnosis not present

## 2021-10-28 DIAGNOSIS — G35 Multiple sclerosis: Secondary | ICD-10-CM | POA: Diagnosis not present

## 2021-10-28 DIAGNOSIS — R279 Unspecified lack of coordination: Secondary | ICD-10-CM | POA: Diagnosis not present

## 2021-10-29 DIAGNOSIS — N390 Urinary tract infection, site not specified: Secondary | ICD-10-CM | POA: Diagnosis not present

## 2021-10-31 DIAGNOSIS — Z7901 Long term (current) use of anticoagulants: Secondary | ICD-10-CM | POA: Diagnosis not present

## 2021-11-01 DIAGNOSIS — R279 Unspecified lack of coordination: Secondary | ICD-10-CM | POA: Diagnosis not present

## 2021-11-01 DIAGNOSIS — G35 Multiple sclerosis: Secondary | ICD-10-CM | POA: Diagnosis not present

## 2021-11-01 DIAGNOSIS — N390 Urinary tract infection, site not specified: Secondary | ICD-10-CM | POA: Diagnosis not present

## 2021-11-02 DIAGNOSIS — C649 Malignant neoplasm of unspecified kidney, except renal pelvis: Secondary | ICD-10-CM | POA: Diagnosis not present

## 2021-11-02 DIAGNOSIS — R232 Flushing: Secondary | ICD-10-CM | POA: Diagnosis not present

## 2021-11-02 DIAGNOSIS — R61 Generalized hyperhidrosis: Secondary | ICD-10-CM | POA: Diagnosis not present

## 2021-11-02 DIAGNOSIS — N390 Urinary tract infection, site not specified: Secondary | ICD-10-CM | POA: Diagnosis not present

## 2021-11-02 DIAGNOSIS — R110 Nausea: Secondary | ICD-10-CM | POA: Diagnosis not present

## 2021-11-02 DIAGNOSIS — B372 Candidiasis of skin and nail: Secondary | ICD-10-CM | POA: Diagnosis not present

## 2021-11-02 DIAGNOSIS — E039 Hypothyroidism, unspecified: Secondary | ICD-10-CM | POA: Diagnosis not present

## 2021-11-02 DIAGNOSIS — B379 Candidiasis, unspecified: Secondary | ICD-10-CM | POA: Diagnosis not present

## 2021-11-02 DIAGNOSIS — K5900 Constipation, unspecified: Secondary | ICD-10-CM | POA: Diagnosis not present

## 2021-11-02 DIAGNOSIS — G35 Multiple sclerosis: Secondary | ICD-10-CM | POA: Diagnosis not present

## 2021-11-02 DIAGNOSIS — L258 Unspecified contact dermatitis due to other agents: Secondary | ICD-10-CM | POA: Diagnosis not present

## 2021-11-02 DIAGNOSIS — I509 Heart failure, unspecified: Secondary | ICD-10-CM | POA: Diagnosis not present

## 2021-11-03 DIAGNOSIS — G35 Multiple sclerosis: Secondary | ICD-10-CM | POA: Diagnosis not present

## 2021-11-03 DIAGNOSIS — Z7901 Long term (current) use of anticoagulants: Secondary | ICD-10-CM | POA: Diagnosis not present

## 2021-11-03 DIAGNOSIS — R279 Unspecified lack of coordination: Secondary | ICD-10-CM | POA: Diagnosis not present

## 2021-11-05 DIAGNOSIS — Z5181 Encounter for therapeutic drug level monitoring: Secondary | ICD-10-CM | POA: Diagnosis not present

## 2021-11-05 DIAGNOSIS — Z7901 Long term (current) use of anticoagulants: Secondary | ICD-10-CM | POA: Diagnosis not present

## 2021-11-07 DIAGNOSIS — Z7901 Long term (current) use of anticoagulants: Secondary | ICD-10-CM | POA: Diagnosis not present

## 2021-11-07 DIAGNOSIS — G4700 Insomnia, unspecified: Secondary | ICD-10-CM | POA: Diagnosis not present

## 2021-11-07 DIAGNOSIS — M1990 Unspecified osteoarthritis, unspecified site: Secondary | ICD-10-CM | POA: Diagnosis not present

## 2021-11-07 DIAGNOSIS — R100 Acute abdomen: Secondary | ICD-10-CM | POA: Diagnosis not present

## 2021-11-07 DIAGNOSIS — E559 Vitamin D deficiency, unspecified: Secondary | ICD-10-CM | POA: Diagnosis not present

## 2021-11-07 DIAGNOSIS — E785 Hyperlipidemia, unspecified: Secondary | ICD-10-CM | POA: Diagnosis not present

## 2021-11-07 DIAGNOSIS — K219 Gastro-esophageal reflux disease without esophagitis: Secondary | ICD-10-CM | POA: Diagnosis not present

## 2021-11-07 DIAGNOSIS — R197 Diarrhea, unspecified: Secondary | ICD-10-CM | POA: Diagnosis not present

## 2021-11-07 DIAGNOSIS — G8929 Other chronic pain: Secondary | ICD-10-CM | POA: Diagnosis not present

## 2021-11-07 DIAGNOSIS — K5900 Constipation, unspecified: Secondary | ICD-10-CM | POA: Diagnosis not present

## 2021-11-07 DIAGNOSIS — R110 Nausea: Secondary | ICD-10-CM | POA: Diagnosis not present

## 2021-11-07 DIAGNOSIS — R279 Unspecified lack of coordination: Secondary | ICD-10-CM | POA: Diagnosis not present

## 2021-11-07 DIAGNOSIS — G35 Multiple sclerosis: Secondary | ICD-10-CM | POA: Diagnosis not present

## 2021-11-07 DIAGNOSIS — N390 Urinary tract infection, site not specified: Secondary | ICD-10-CM | POA: Diagnosis not present

## 2021-11-08 DIAGNOSIS — R109 Unspecified abdominal pain: Secondary | ICD-10-CM | POA: Diagnosis not present

## 2021-11-08 DIAGNOSIS — K5900 Constipation, unspecified: Secondary | ICD-10-CM | POA: Diagnosis not present

## 2021-11-08 DIAGNOSIS — R140 Abdominal distension (gaseous): Secondary | ICD-10-CM | POA: Diagnosis not present

## 2021-11-09 DIAGNOSIS — G4733 Obstructive sleep apnea (adult) (pediatric): Secondary | ICD-10-CM | POA: Diagnosis not present

## 2021-11-09 DIAGNOSIS — E039 Hypothyroidism, unspecified: Secondary | ICD-10-CM | POA: Diagnosis not present

## 2021-11-09 DIAGNOSIS — K5900 Constipation, unspecified: Secondary | ICD-10-CM | POA: Diagnosis not present

## 2021-11-09 DIAGNOSIS — R279 Unspecified lack of coordination: Secondary | ICD-10-CM | POA: Diagnosis not present

## 2021-11-09 DIAGNOSIS — R140 Abdominal distension (gaseous): Secondary | ICD-10-CM | POA: Diagnosis not present

## 2021-11-09 DIAGNOSIS — N390 Urinary tract infection, site not specified: Secondary | ICD-10-CM | POA: Diagnosis not present

## 2021-11-09 DIAGNOSIS — I509 Heart failure, unspecified: Secondary | ICD-10-CM | POA: Diagnosis not present

## 2021-11-09 DIAGNOSIS — K219 Gastro-esophageal reflux disease without esophagitis: Secondary | ICD-10-CM | POA: Diagnosis not present

## 2021-11-09 DIAGNOSIS — Z7901 Long term (current) use of anticoagulants: Secondary | ICD-10-CM | POA: Diagnosis not present

## 2021-11-09 DIAGNOSIS — B379 Candidiasis, unspecified: Secondary | ICD-10-CM | POA: Diagnosis not present

## 2021-11-09 DIAGNOSIS — R100 Acute abdomen: Secondary | ICD-10-CM | POA: Diagnosis not present

## 2021-11-09 DIAGNOSIS — G35 Multiple sclerosis: Secondary | ICD-10-CM | POA: Diagnosis not present

## 2021-11-10 ENCOUNTER — Telehealth: Payer: Self-pay | Admitting: Neurology

## 2021-11-10 DIAGNOSIS — K567 Ileus, unspecified: Secondary | ICD-10-CM | POA: Diagnosis not present

## 2021-11-10 NOTE — Telephone Encounter (Signed)
Transportation arranged through [employer] [phone] ?

## 2021-11-10 NOTE — Telephone Encounter (Signed)
Pt has appt 11/14/21 for baclofen pump refill. Per Selinda Eon here at check in, pt no longer can call to schedule transportation via [facility]. We have to set up and screen patient first. Selinda Eon is going to reach out to contact w/ [facility] to see what we can do to help get transportation set up for pt appt Monday. ?

## 2021-11-10 NOTE — Telephone Encounter (Signed)
I have called the [place] transportation phone number and there was no answer. Left a message advising that the pt has called advising we had to contact them to arrange transportation. I advised they can call us back so that we can provide what they need to get her scheduled to come in.  ? ?If the [place] transportation team calls back, please advise that pt has a scheduled appointment and is in need of transportation. She is wheelchair bound.  ?

## 2021-11-10 NOTE — Telephone Encounter (Signed)
Pt states she has been told thru [facility] that starting Monday the provider has to reach out to [facility] [employer] to arrange pick up for pt.  Please call ?

## 2021-11-10 NOTE — Telephone Encounter (Addendum)
Called pt back. Does not have phone# to [employer]. States they are located out of [place]. I looked it up online: [employer]: [address] [place], [place], [place] [address] Phone: [phone] ?Pt states to let them know we are a [facility] provider. Need to be picked up/in a wheelchair. They know where [facility] is.  ?Advised I will call and try and set this up and then call her back w/ information.  ? ?I called [facility] psychotherapist. Spoke w/ Sammy. Requests are done by e-mail. They like email so they can print out paper copy. Need to include following on email: pt name/contact phone # for pt, address of pt, doctor office address, appt time and check in time. About how long appt will last or place "will call". Needs pt mobility (in wheelchair, etc...). Include cost center number to bill: [phone].  ? ?Tomorrow, they work on Monday schedule. Once schedule done, they will contact pt to verify appt and info. Also gets automated call day before to remind of pick up. Also day of, driver will alert pt when on the way to pick up. Typically needs 2-3 days of notice for future requests.  ? ?Email: [email or web address]. I sent e-mail.  ? ?I called and spoke with pt. Relayed transportation set up and they will pick up her up at 1pm on Monday. She states husband will ride with her.  ? ?

## 2021-11-10 NOTE — Telephone Encounter (Signed)
Pt said can call [facility] [employer] and tell them you are a [facility] provider and that pt has wheelchair. Would like a call back to confirm nurse has gotten transportation to appt. ?

## 2021-11-11 DIAGNOSIS — G35 Multiple sclerosis: Secondary | ICD-10-CM | POA: Diagnosis not present

## 2021-11-11 DIAGNOSIS — R279 Unspecified lack of coordination: Secondary | ICD-10-CM | POA: Diagnosis not present

## 2021-11-14 ENCOUNTER — Encounter: Payer: Self-pay | Admitting: Neurology

## 2021-11-14 ENCOUNTER — Ambulatory Visit (INDEPENDENT_AMBULATORY_CARE_PROVIDER_SITE_OTHER): Payer: Medicare Other | Admitting: Neurology

## 2021-11-14 VITALS — BP 112/49 | HR 83 | Ht 63.0 in

## 2021-11-14 DIAGNOSIS — G8929 Other chronic pain: Secondary | ICD-10-CM | POA: Diagnosis not present

## 2021-11-14 DIAGNOSIS — G35 Multiple sclerosis: Secondary | ICD-10-CM

## 2021-11-14 DIAGNOSIS — I4891 Unspecified atrial fibrillation: Secondary | ICD-10-CM | POA: Diagnosis not present

## 2021-11-14 DIAGNOSIS — M1990 Unspecified osteoarthritis, unspecified site: Secondary | ICD-10-CM | POA: Diagnosis not present

## 2021-11-14 DIAGNOSIS — R110 Nausea: Secondary | ICD-10-CM | POA: Diagnosis not present

## 2021-11-14 DIAGNOSIS — M62838 Other muscle spasm: Secondary | ICD-10-CM | POA: Diagnosis not present

## 2021-11-14 DIAGNOSIS — B372 Candidiasis of skin and nail: Secondary | ICD-10-CM | POA: Diagnosis not present

## 2021-11-14 DIAGNOSIS — C649 Malignant neoplasm of unspecified kidney, except renal pelvis: Secondary | ICD-10-CM | POA: Diagnosis not present

## 2021-11-14 DIAGNOSIS — Z978 Presence of other specified devices: Secondary | ICD-10-CM

## 2021-11-14 DIAGNOSIS — G629 Polyneuropathy, unspecified: Secondary | ICD-10-CM | POA: Diagnosis not present

## 2021-11-14 DIAGNOSIS — R279 Unspecified lack of coordination: Secondary | ICD-10-CM | POA: Diagnosis not present

## 2021-11-14 DIAGNOSIS — F0390 Unspecified dementia without behavioral disturbance: Secondary | ICD-10-CM | POA: Diagnosis not present

## 2021-11-14 DIAGNOSIS — L258 Unspecified contact dermatitis due to other agents: Secondary | ICD-10-CM | POA: Diagnosis not present

## 2021-11-14 DIAGNOSIS — N390 Urinary tract infection, site not specified: Secondary | ICD-10-CM | POA: Diagnosis not present

## 2021-11-14 DIAGNOSIS — R29898 Other symptoms and signs involving the musculoskeletal system: Secondary | ICD-10-CM

## 2021-11-14 DIAGNOSIS — G4733 Obstructive sleep apnea (adult) (pediatric): Secondary | ICD-10-CM | POA: Diagnosis not present

## 2021-11-14 NOTE — Progress Notes (Signed)
RM 1, with family. Skin prepped with Betadine and draped in sterile fashion. Center port of implanted pump accessed using 22g huber needle.  Residual fluid measuring 9 ml was removed from the reservoir.  Gablofen 2057mg/mlx2 instilled into pump. Pump reprogrammed to reflect a volume of 465m  ERI is 10/2027. Next appt given for 02/13/22 at 2:30pm. Alarm date: 02/18/2022. ?

## 2021-11-14 NOTE — Progress Notes (Signed)
? ?[facility] ? ?PATIENT: Toni [name] ?DOB: 1946/03/10 ? ?REFERRING DOCTOR OR PCP: Dr. Brett Fairy ?SOURCE: Patient, notes from Dr. Brett Fairy and Dr. [name] ? ?_________________________________ ? ? ?HISTORICAL ? ?CHIEF COMPLAINT:  ?Chief Complaint  ?Patient presents with  ? Pump refill  ? ? ?HISTORY OF PRESENT ILLNESS:  ?Toni [name] is a 76 year old woman with multiple sclerosis.  She is followed primarily by Dr. Brett Fairy in the office.  She was seeing Dr. [name] for baclofen pump management.  She had her pump replaced in 2022 (Dr. Vertell Limber).     She is in [facility] in [place].    ? ?She notes that her spasticity is generally doing well with the baclofen pump (she is on third pump - first one 2009, last one July 2022).   She will occasionally take a baclofen pill but rarely needs to do so.   She is happy with the current pump settings and does not feel she needs to make any adjustment. ? ?She is unable to walk but assist some with transfers.  She is able to move her legs in bed and to go on her side in bed.  She also notes weakness in her hands. ? ?She has mild dysesthesias in her abdomen and legs.  Gabapentin has helped.    ? ?She has difficulty with a neurogenic bladder and has frequent UTIs.  Spasticity is worse when she is sick.  Vision is doing well. ? ?REVIEW OF SYSTEMS: ?Constitutional: No fevers, chills, sweats, or change in appetite ?Eyes: No visual changes, double vision, eye pain ?Ear, nose and throat: No hearing loss, ear pain, nasal congestion, sore throat ?Cardiovascular: No chest pain, palpitations ?Respiratory:  No shortness of breath at rest or with exertion.   No wheezes ?GastrointestinaI: No nausea, vomiting, diarrhea, abdominal pain, fecal incontinence ?Genitourinary:  No dysuria, urinary retention or frequency.  No nocturia. ?Musculoskeletal:  No neck pain, back pain ?Integumentary: No rash, pruritus, skin lesions ?Neurological: as above ?Psychiatric: No depression  at this time.  No anxiety ?Endocrine: No palpitations, diaphoresis, change in appetite, change in weigh or increased thirst ?Hematologic/Lymphatic:  No anemia, purpura, petechiae. ?Allergic/Immunologic: No itchy/runny eyes, nasal congestion, recent allergic reactions, rashes ? ?ALLERGIES: ?Allergies  ?Allergen Reactions  ? Other Rash  ?  Topical Surgical prep  - severe rash  ? Chlorhexidine Hives and Other (See Comments)  ?  And, sores appear where applied  ? Hydrocodone Nausea And Vomiting and Other (See Comments)  ?  Hydrocodone causes vomiting ?Hydrocodone causes vomiting  ? Oxycodone Nausea And Vomiting and Other (See Comments)  ?  Oral oxycodone causes vomiting  ? Sertraline Hives, Swelling, Rash and Other (See Comments)  ?  [DERM][OTHER]RASH WITH SWELLING  ? Macrobid [Nitrofurantoin]   ?  Other reaction(s): 3.9.17 nausea and vomitting,  ? Phenol Hives  ? Rofecoxib   ?  Other reaction(s): Unknown  ? ? ?HOME MEDICATIONS: ? ?Current Outpatient Medications:  ?  acetaminophen (TYLENOL) 500 MG tablet, Take 1,000 mg by mouth every 6 (six) hours as needed (fever/headache/pain)., Disp: , Rfl:  ?  ALPRAZolam (XANAX) 0.5 MG tablet, TAKE 1 TABLET BY MOUTH 3  TIMES DAILY AS NEEDED FOR  ANXIETY AND SLEEP (Patient taking differently: Take 0.5 mg by mouth 3 (three) times daily as needed for anxiety or sleep.), Disp: 90 tablet, Rfl: 1 ?  ARTIFICIAL TEARS 0.2-0.2-1 % SOLN, Apply to eye., Disp: , Rfl:  ?  Ascorbic Acid (VITAMIN C) 1000 MG tablet, Take 1,000 mg by  mouth 4 (four) times daily., Disp: , Rfl:  ?  Biotin 10000 MCG TABS, Take 10,000 mcg by mouth daily after lunch., Disp: , Rfl:  ?  buPROPion (WELLBUTRIN XL) 150 MG 24 hr tablet, Take 1 tablet (150 mg total) by mouth daily. (Patient taking differently: Take 150 mg by mouth daily. After breakfest), Disp: 90 tablet, Rfl: 1 ?  Calcium Carbonate-Vitamin D3 600-400 MG-UNIT TABS, Take 1 tablet by mouth in the morning and at bedtime. (After breakfast & after lunch), Disp: ,  Rfl:  ?  cetirizine (ZYRTEC) 10 MG tablet, Take 10 mg by mouth daily as needed for allergies. , Disp: , Rfl:  ?  Cholecalciferol (VITAMIN D3) 50 MCG (2000 UT) TABS, Take 2,000 Units by mouth in the morning and at bedtime. (After breakfast & after lunch), Disp: , Rfl:  ?  ciprofloxacin (CIPRO) 500 MG tablet, Take 500 mg by mouth 2 (two) times daily. For 7 days, Disp: , Rfl:  ?  Coenzyme Q10 (COQ10 PO), Take by mouth daily in the afternoon., Disp: , Rfl:  ?  corticotropin (ACTHAR) 80 UNIT/ML injectable gel, inject 1 ml into the muscle every other day for 10 days each month, Disp: 5 mL, Rfl: 5 ?  diclofenac Sodium (VOLTAREN) 1 % GEL, Apply 2 g topically 3 (three) times daily as needed. (Patient taking differently: Apply 2 g topically 3 (three) times daily as needed (pain (applied to bilateral shoulders)).), Disp: 100 g, Rfl: 3 ?  diltiazem (CARDIZEM CD) 240 MG 24 hr capsule, Take 240 mg by mouth daily., Disp: , Rfl:  ?  docusate sodium (COLACE) 100 MG capsule, Take 1 capsule (100 mg total) by mouth daily. (Patient taking differently: Take 100 mg by mouth 2 (two) times daily.), Disp: 10 capsule, Rfl: 0 ?  escitalopram (LEXAPRO) 10 MG tablet, Take 1 tablet (10 mg total) by mouth daily. (Patient taking differently: Take 10 mg by mouth at bedtime.), Disp: 90 tablet, Rfl: 1 ?  famotidine (PEPCID) 20 MG tablet, Take 20 mg by mouth daily., Disp: , Rfl:  ?  furosemide (LASIX) 20 MG tablet, Take 2 tablets (40 mg total) by mouth in the morning, at noon, and at bedtime. (Patient taking differently: Take 60 mg by mouth 2 (two) times daily.), Disp: 30 tablet, Rfl: 0 ?  gabapentin (NEURONTIN) 300 MG capsule, Mrs. Nordahl will take 300 mg of gabapentin at 6 AM, 10 AM, 2 PM and 6 PM and 600 mg at 10 PM by mouth., Disp: 180 capsule, Rfl: 11 ?  lactobacillus acidophilus (BACID) TABS tablet, Take 1 tablet by mouth 2 (two) times daily as needed., Disp: , Rfl:  ?  levothyroxine (SYNTHROID, LEVOTHROID) 75 MCG tablet, Take 75 mcg by mouth daily  before breakfast. (0500), Disp: , Rfl:  ?  linaclotide (LINZESS) 145 MCG CAPS capsule, Take 1 capsule (145 mcg total) by mouth daily before breakfast. (Patient taking differently: Take 145 mcg by mouth daily before breakfast. (0500)), Disp: 90 capsule, Rfl: 3 ?  melatonin 5 MG TABS, Take 5 mg by mouth at bedtime., Disp: , Rfl:  ?  Menthol-Zinc Oxide (REMEDY CALAZIME) 0.4-20.5 % PSTE, Apply 1 application topically 3 (three) times daily as needed (buttocks/sacrum with incontinences care episodes)., Disp: , Rfl:  ?  methenamine (HIPREX) 1 g tablet, Take 1 g by mouth 2 (two) times daily with a meal., Disp: , Rfl:  ?  Multiple Vitamin (MULTIVITAMIN WITH MINERALS) TABS tablet, Take 1 tablet by mouth daily after lunch., Disp: , Rfl:  ?  neomycin-bacitracin-polymyxin (NEOSPORIN) ointment, Apply 1 application topically 2 (two) times daily as needed for wound care (applied to nose (wear mask rubs))., Disp: , Rfl:  ?  NON FORMULARY, ASV breathing machine is to be used while napping & at bedtime. Fill canister up to max line with distilled water and turn concentrator to 5LPM., Disp: , Rfl:  ?  NYAMYC powder, Apply topically., Disp: , Rfl:  ?  omeprazole (PRILOSEC) 20 MG capsule, Take 20 mg by mouth daily before breakfast. (0500), Disp: , Rfl:  ?  ondansetron (ZOFRAN) 8 MG tablet, Take 8 mg by mouth daily as needed for nausea or vomiting., Disp: , Rfl:  ?  oxybutynin (DITROPAN) 5 MG tablet, Take 5 mg by mouth 3 (three) times daily as needed for bladder spasms., Disp: , Rfl:  ?  phenazopyridine (PYRIDIUM) 100 MG tablet, Take 100 mg by mouth 3 (three) times daily., Disp: , Rfl:  ?  potassium chloride SA (KLOR-CON) 20 MEQ tablet, Take 2 tablets (40 mEq total) by mouth daily. (Patient taking differently: Take 20 mEq by mouth 2 (two) times daily. (After lunch & after dinner)), Disp: 180 tablet, Rfl: 3 ?  psyllium (METAMUCIL SMOOTH TEXTURE) 28 % packet, Take 1 packet by mouth at bedtime., Disp: , Rfl:  ?  simvastatin (ZOCOR) 10 MG  tablet, Take 10 mg by mouth at bedtime., Disp: , Rfl:  ?  SYRINGE/NEEDLE, DISP, 1 ML (BD LUER-LOK SYRINGE) 25G X 5/8" 1 ML MISC, Use as directed to inject  Acthar, Disp: 10 each, Rfl: prn ?  warfarin (COU

## 2021-11-15 DIAGNOSIS — R109 Unspecified abdominal pain: Secondary | ICD-10-CM | POA: Diagnosis not present

## 2021-11-15 DIAGNOSIS — K567 Ileus, unspecified: Secondary | ICD-10-CM | POA: Diagnosis not present

## 2021-11-16 ENCOUNTER — Ambulatory Visit (INDEPENDENT_AMBULATORY_CARE_PROVIDER_SITE_OTHER): Payer: Medicare Other | Admitting: Physician Assistant

## 2021-11-16 ENCOUNTER — Encounter: Payer: Self-pay | Admitting: Physician Assistant

## 2021-11-16 VITALS — BP 100/60 | HR 84 | Ht 63.0 in | Wt 193.0 lb

## 2021-11-16 DIAGNOSIS — R110 Nausea: Secondary | ICD-10-CM | POA: Diagnosis not present

## 2021-11-16 DIAGNOSIS — K5909 Other constipation: Secondary | ICD-10-CM | POA: Diagnosis not present

## 2021-11-16 DIAGNOSIS — Z9981 Dependence on supplemental oxygen: Secondary | ICD-10-CM

## 2021-11-16 DIAGNOSIS — Z7901 Long term (current) use of anticoagulants: Secondary | ICD-10-CM

## 2021-11-16 DIAGNOSIS — G35 Multiple sclerosis: Secondary | ICD-10-CM | POA: Diagnosis not present

## 2021-11-16 MED ORDER — OMEPRAZOLE 40 MG PO CPDR: 40.0000 mg | DELAYED_RELEASE_CAPSULE | Freq: Every morning | ORAL | 11 refills | Status: DC

## 2021-11-16 MED ORDER — ONDANSETRON HCL 4 MG PO TABS: 4.0000 mg | ORAL_TABLET | Freq: Four times a day (QID) | ORAL | 11 refills | Status: AC

## 2021-11-16 NOTE — Progress Notes (Signed)
Reviewed and agree with management plans. ? ?Kellar Westberg L. Assia Meanor, MD, MPH  ?

## 2021-11-16 NOTE — Progress Notes (Signed)
? ?Chief Complaint: Nausea and constipation ? ?HPI: ?   Toni [name] is a 76 year old Caucasian female with a past medical history as listed below including CHF and renal cell carcinoma as well as DVT, known to Dr. Tarri Glenn, who was referred to me by Jodi Marble, MD for a complaint of nausea and constipation. ?   2006 colonoscopy normal with Dr. Earlean Shawl.  (Per Dr. Tarri Glenn notes) ?   03/15/2009 EGD with candidal pharyngitis and nonerosive esophagitis. ?   02/14/2019 patient seen via telemedicine for constipation.  At that time discussed history of impaction.  She was told to use Metamucil once daily and continue Lactulose twice daily and Linzess 145 mcg daily.  Her reflux was controlled on a daily PPI and H2 blocker.  Noted previous rectal bleeding treated with Anusol suppositories twice daily for a week.  Discussed MS for 30 years and wheelchair-bound.  Noted that she had chronic constipation which was some better with Metamucil, Lactulose, stool softener and Linzess.  It was recommended she have a Colonoscopy prior to anorectal manometry.  Recommendations for Warfarin washout at the hospital and colonoscopy after. ?   11/08/2021 abdominal x-ray with moderate gaseous distention consistent with adynamic ileus and moderate fecal material throughout the colon.  No evidence of bowel obstruction. ?   11/10/2021 x-ray of the abdomen showed markedly limited, moderate gaseous distention of the bowel loops compatible with adynamic ileus. ?   Today, the patient is seen after being transferred from [facility] skilled nursing facility with her daughter.  Their main concern is of nausea which has been off and on since she contracted COVID in January of last year, but over the past 2 weeks it has worsened.  They have tried different doses of Zofran and currently she is on 8 mg twice a day given at 10 AM and 10 PM.  Patient tells me that it works for the most part but "I can tell when I am due for another dose".  She is on  medicine for gastritis/reflux, Omeprazole 20 mg in the morning and Famotidine 20 mg at night.  She has not noticed any overt heartburn or reflux symptoms.  No vomiting. ?   Continues with chronic constipation thought related to her MS in the past.  This is usually fairly well controlled on her current regimen of as needed enemas and Linzess 145 mcg daily as well as a Dulcolax suppository in the evenings and Lactulose twice a day.  Does tell me that her Metamucil was recently stopped due to x-ray showing possible ileus and they have also backed her down to a mostly liquid diet.  This was because she did not have a bowel movement at all for 3 days per her history, but since then she has started to have more small bowel movements and is passing gas, she is currently on a soft diet per their report and doing well still passing stool with no increase in abdominal discomfort or distention.  Suffers from chronic bloating and distention at times. ?   Denies fever, chills or weight loss. ? ?Past Medical History:  ?Diagnosis Date  ? Abnormality of gait 03/01/2015  ? Anxiety   ? Arthritis   ? "knees" (01/19/2016)  ? Asthma   ? as a child   ? Back pain   ? Bilateral carpal tunnel syndrome 10/04/2020  ? Cancer ([place])   ? / RENAL CELL CARCINOMA - GETS CT SCAN EVERY 6 MONTHS TO WATCH   ? CHF (  congestive heart failure) ([place])   ? Chronic pain   ? "nerve pain from the MS"  ? Complex atypical endometrial hyperplasia   ? Constipation   ? Depression   ? DVT (deep venous thrombosis) ([place]) 1983  ? "RLE; may have just been phlebitis; it was before the age of dopplers"  ? Dysrhythmia   ? afib   ? Edema   ? 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' ablation of R GSV 2012  ? GERD (gastroesophageal reflux disease)   ? HLD (hyperlipidemia)   ? Hypertension   ? Hypotension   ? Hypothyroidism   ? Joint pain   ? Multiple sclerosis ([place])   ? has had this may 1989-[place] reg doc  ? OSA on CPAP   ? bipap machine - SLEEP APNEA WORSE  SINCE COVID IN 1/21 PER PATIENT SETTING HAS INCREASED FROM 9 TO 30   ? Osteoarthritis   ? PAF (paroxysmal atrial fibrillation) ([place])   ? on coumadin; documented on monitor 06/2010 - no longer takes per Kym Groom 02/09/21  ? Pneumonia 11/2011  ? Sleep apnea   ? uses sleep apnea machine  ? ? ?Past Surgical History:  ?Procedure Laterality Date  ? ANTERIOR CERVICAL DECOMP/DISCECTOMY FUSION  01/2004  ? CARDIAC CATHETERIZATION  06/22/2010  ? normal coronary arteries, PAF  ? CATARACT EXTRACTION, BILATERAL Bilateral   ? CHILD BIRTHS    ? X2  ? DILATATION & CURETTAGE/HYSTEROSCOPY WITH MYOSURE N/A 01/15/2020  ? Procedure: [place];  Surgeon: Arvella Nigh, MD;  Location: [facility];  Service: Gynecology;  Laterality: N/A;  pt in wheelchair-has MS  ? DILATION AND CURETTAGE OF UTERUS    ? DILATION AND CURETTAGE OF UTERUS N/A 02/16/2020  ? Procedure: DILATATION AND CURETTAGE;  Surgeon: Lafonda Mosses, MD;  Location: [facility];  Service: Gynecology;  Laterality: N/A;  NOT GENERAL -NO INTUBATION  ? INFUSION PUMP IMPLANTATION  ~ 2009  ? baclofen infusion in lower abd  ? INTRATHECAL PUMP REVISION N/A 02/18/2021  ? Procedure: Baclofen Pump replacement with possible revision of catheter;  Surgeon: Erline Levine, MD;  Location: [facility];  Service: Neurosurgery;  Laterality: N/A;  3C/RM 20  ? INTRAUTERINE DEVICE (IUD) INSERTION N/A 02/16/2020  ? Procedure: INTRAUTERINE DEVICE (IUD) INSERTION - [facility];  Surgeon: Lafonda Mosses, MD;  Location: [facility];  Service: Gynecology;  Laterality: N/A;  ? PAIN PUMP REVISION N/A 07/29/2014  ? Procedure: Baclofen pump replacement;  Surgeon: Erline Levine, MD;  Location: [facility];  Service: Neurosurgery;  Laterality: N/A;  Baclofen pump replacement  ? TONSILLECTOMY AND ADENOIDECTOMY    ? VARICOSE VEIN SURGERY  ~ 1968  ? VENOUS ABLATION  12/16/2010  ? radiofreq ablation -Dr Elisabeth Cara and [facility]  ? WISDOM TOOTH EXTRACTION    ? ? ?Current Outpatient Medications  ?Medication  Sig Dispense Refill  ? acetaminophen (TYLENOL) 500 MG tablet Take 1,000 mg by mouth every 6 (six) hours as needed (fever/headache/pain).    ? ALPRAZolam (XANAX) 0.5 MG tablet TAKE 1 TABLET BY MOUTH 3  TIMES DAILY AS NEEDED FOR  ANXIETY AND SLEEP (Patient taking differently: Take 0.5 mg by mouth 3 (three) times daily as needed for anxiety or sleep.) 90 tablet 1  ? ARTIFICIAL TEARS 0.2-0.2-1 % SOLN Apply to eye.    ? Ascorbic Acid (VITAMIN C) 1000 MG tablet Take 1,000 mg by mouth 4 (four) times daily.    ? Biotin 10000 MCG TABS Take 10,000 mcg  by mouth daily after lunch.    ? buPROPion (WELLBUTRIN XL) 150 MG 24 hr tablet Take 1 tablet (150 mg total) by mouth daily. (Patient taking differently: Take 150 mg by mouth daily. After breakfest) 90 tablet 1  ? Calcium Carbonate-Vitamin D3 600-400 MG-UNIT TABS Take 1 tablet by mouth in the morning and at bedtime. (After breakfast & after lunch)    ? cetirizine (ZYRTEC) 10 MG tablet Take 10 mg by mouth daily as needed for allergies.     ? Cholecalciferol (VITAMIN D3) 50 MCG (2000 UT) TABS Take 2,000 Units by mouth in the morning and at bedtime. (After breakfast & after lunch)    ? ciprofloxacin (CIPRO) 500 MG tablet Take 500 mg by mouth 2 (two) times daily. For 7 days    ? Coenzyme Q10 (COQ10 PO) Take by mouth daily in the afternoon.    ? corticotropin (ACTHAR) 80 UNIT/ML injectable gel inject 1 ml into the muscle every other day for 10 days each month 5 mL 5  ? diclofenac Sodium (VOLTAREN) 1 % GEL Apply 2 g topically 3 (three) times daily as needed. (Patient taking differently: Apply 2 g topically 3 (three) times daily as needed (pain (applied to bilateral shoulders)).) 100 g 3  ? diltiazem (CARDIZEM CD) 240 MG 24 hr capsule Take 240 mg by mouth daily.    ? docusate sodium (COLACE) 100 MG capsule Take 1 capsule (100 mg total) by mouth daily. (Patient taking differently: Take 100 mg by mouth 2 (two) times daily.) 10 capsule 0  ? escitalopram (LEXAPRO) 10 MG tablet Take 1 tablet  (10 mg total) by mouth daily. (Patient taking differently: Take 10 mg by mouth at bedtime.) 90 tablet 1  ? famotidine (PEPCID) 20 MG tablet Take 20 mg by mouth daily.    ? furosemide (LASIX) 20 MG tablet

## 2021-11-16 NOTE — Patient Instructions (Addendum)
Advance your diet back to a regular diet as tolerated. After two days add Metamucil supplement daily.  ? ?Increase your omeprazole to 40 mg every morning. We have printed a prescription for you to take back to [facility].  ? ?Also, we have changed your zofran to 4 mg four times a day. We have also printed a prescription for this as well. ? ?Please follow up with Dr. Tarri Glenn in 2 months but can cancel appointment if feeling better.  ? ?The [facility] providers would like to encourage you to use [facility] to communicate with providers for non-urgent requests or questions.  Due to long hold times on the telephone, sending your provider a message by [facility] may be a faster and more efficient way to get a response.  Please allow 48 business hours for a response.  Please remember that this is for non-urgent requests.  ? ?

## 2021-11-17 DIAGNOSIS — Z7901 Long term (current) use of anticoagulants: Secondary | ICD-10-CM | POA: Diagnosis not present

## 2021-11-17 DIAGNOSIS — R110 Nausea: Secondary | ICD-10-CM | POA: Diagnosis not present

## 2021-11-17 DIAGNOSIS — C649 Malignant neoplasm of unspecified kidney, except renal pelvis: Secondary | ICD-10-CM | POA: Diagnosis not present

## 2021-11-17 DIAGNOSIS — B372 Candidiasis of skin and nail: Secondary | ICD-10-CM | POA: Diagnosis not present

## 2021-11-17 DIAGNOSIS — H04123 Dry eye syndrome of bilateral lacrimal glands: Secondary | ICD-10-CM | POA: Diagnosis not present

## 2021-11-17 DIAGNOSIS — M1990 Unspecified osteoarthritis, unspecified site: Secondary | ICD-10-CM | POA: Diagnosis not present

## 2021-11-17 DIAGNOSIS — R279 Unspecified lack of coordination: Secondary | ICD-10-CM | POA: Diagnosis not present

## 2021-11-17 DIAGNOSIS — R5381 Other malaise: Secondary | ICD-10-CM | POA: Diagnosis not present

## 2021-11-17 DIAGNOSIS — I4891 Unspecified atrial fibrillation: Secondary | ICD-10-CM | POA: Diagnosis not present

## 2021-11-17 DIAGNOSIS — G629 Polyneuropathy, unspecified: Secondary | ICD-10-CM | POA: Diagnosis not present

## 2021-11-17 DIAGNOSIS — G8929 Other chronic pain: Secondary | ICD-10-CM | POA: Diagnosis not present

## 2021-11-17 DIAGNOSIS — E559 Vitamin D deficiency, unspecified: Secondary | ICD-10-CM | POA: Diagnosis not present

## 2021-11-17 DIAGNOSIS — G4733 Obstructive sleep apnea (adult) (pediatric): Secondary | ICD-10-CM | POA: Diagnosis not present

## 2021-11-17 DIAGNOSIS — G35 Multiple sclerosis: Secondary | ICD-10-CM | POA: Diagnosis not present

## 2021-11-18 DIAGNOSIS — R109 Unspecified abdominal pain: Secondary | ICD-10-CM | POA: Diagnosis not present

## 2021-11-18 DIAGNOSIS — G35 Multiple sclerosis: Secondary | ICD-10-CM | POA: Diagnosis not present

## 2021-11-18 DIAGNOSIS — R279 Unspecified lack of coordination: Secondary | ICD-10-CM | POA: Diagnosis not present

## 2021-11-21 DIAGNOSIS — H04123 Dry eye syndrome of bilateral lacrimal glands: Secondary | ICD-10-CM | POA: Diagnosis not present

## 2021-11-21 DIAGNOSIS — R5381 Other malaise: Secondary | ICD-10-CM | POA: Diagnosis not present

## 2021-11-21 DIAGNOSIS — F329 Major depressive disorder, single episode, unspecified: Secondary | ICD-10-CM | POA: Diagnosis not present

## 2021-11-21 DIAGNOSIS — N390 Urinary tract infection, site not specified: Secondary | ICD-10-CM | POA: Diagnosis not present

## 2021-11-21 DIAGNOSIS — E785 Hyperlipidemia, unspecified: Secondary | ICD-10-CM | POA: Diagnosis not present

## 2021-11-21 DIAGNOSIS — R110 Nausea: Secondary | ICD-10-CM | POA: Diagnosis not present

## 2021-11-21 DIAGNOSIS — F411 Generalized anxiety disorder: Secondary | ICD-10-CM | POA: Diagnosis not present

## 2021-11-21 DIAGNOSIS — G8929 Other chronic pain: Secondary | ICD-10-CM | POA: Diagnosis not present

## 2021-11-21 DIAGNOSIS — K567 Ileus, unspecified: Secondary | ICD-10-CM | POA: Diagnosis not present

## 2021-11-21 DIAGNOSIS — E559 Vitamin D deficiency, unspecified: Secondary | ICD-10-CM | POA: Diagnosis not present

## 2021-11-21 DIAGNOSIS — G4700 Insomnia, unspecified: Secondary | ICD-10-CM | POA: Diagnosis not present

## 2021-11-21 DIAGNOSIS — G35 Multiple sclerosis: Secondary | ICD-10-CM | POA: Diagnosis not present

## 2021-11-21 DIAGNOSIS — N1330 Unspecified hydronephrosis: Secondary | ICD-10-CM | POA: Diagnosis not present

## 2021-11-21 DIAGNOSIS — C649 Malignant neoplasm of unspecified kidney, except renal pelvis: Secondary | ICD-10-CM | POA: Diagnosis not present

## 2021-11-21 DIAGNOSIS — R279 Unspecified lack of coordination: Secondary | ICD-10-CM | POA: Diagnosis not present

## 2021-11-22 DIAGNOSIS — R279 Unspecified lack of coordination: Secondary | ICD-10-CM | POA: Diagnosis not present

## 2021-11-22 DIAGNOSIS — G35 Multiple sclerosis: Secondary | ICD-10-CM | POA: Diagnosis not present

## 2021-11-23 DIAGNOSIS — R301 Vesical tenesmus: Secondary | ICD-10-CM | POA: Diagnosis not present

## 2021-11-24 DIAGNOSIS — I4891 Unspecified atrial fibrillation: Secondary | ICD-10-CM | POA: Diagnosis not present

## 2021-11-24 DIAGNOSIS — I509 Heart failure, unspecified: Secondary | ICD-10-CM | POA: Diagnosis not present

## 2021-11-24 DIAGNOSIS — E559 Vitamin D deficiency, unspecified: Secondary | ICD-10-CM | POA: Diagnosis not present

## 2021-11-24 DIAGNOSIS — M1990 Unspecified osteoarthritis, unspecified site: Secondary | ICD-10-CM | POA: Diagnosis not present

## 2021-11-24 DIAGNOSIS — C649 Malignant neoplasm of unspecified kidney, except renal pelvis: Secondary | ICD-10-CM | POA: Diagnosis not present

## 2021-11-24 DIAGNOSIS — E785 Hyperlipidemia, unspecified: Secondary | ICD-10-CM | POA: Diagnosis not present

## 2021-11-24 DIAGNOSIS — E039 Hypothyroidism, unspecified: Secondary | ICD-10-CM | POA: Diagnosis not present

## 2021-11-26 DIAGNOSIS — Z7901 Long term (current) use of anticoagulants: Secondary | ICD-10-CM | POA: Diagnosis not present

## 2021-11-28 DIAGNOSIS — N130 Hydronephrosis with ureteropelvic junction obstruction: Secondary | ICD-10-CM | POA: Diagnosis not present

## 2021-11-28 DIAGNOSIS — Z7901 Long term (current) use of anticoagulants: Secondary | ICD-10-CM | POA: Diagnosis not present

## 2021-11-28 DIAGNOSIS — N3020 Other chronic cystitis without hematuria: Secondary | ICD-10-CM | POA: Diagnosis not present

## 2021-12-01 ENCOUNTER — Other Ambulatory Visit: Payer: Self-pay | Admitting: Nurse Practitioner

## 2021-12-01 ENCOUNTER — Other Ambulatory Visit (HOSPITAL_COMMUNITY): Payer: Self-pay | Admitting: Nurse Practitioner

## 2021-12-01 DIAGNOSIS — N3020 Other chronic cystitis without hematuria: Secondary | ICD-10-CM

## 2021-12-01 DIAGNOSIS — N130 Hydronephrosis with ureteropelvic junction obstruction: Secondary | ICD-10-CM

## 2021-12-05 DIAGNOSIS — Z7901 Long term (current) use of anticoagulants: Secondary | ICD-10-CM | POA: Diagnosis not present

## 2021-12-06 DIAGNOSIS — N341 Nonspecific urethritis: Secondary | ICD-10-CM | POA: Diagnosis not present

## 2021-12-06 DIAGNOSIS — N390 Urinary tract infection, site not specified: Secondary | ICD-10-CM | POA: Diagnosis not present

## 2021-12-07 ENCOUNTER — Ambulatory Visit (HOSPITAL_COMMUNITY)
Admission: RE | Admit: 2021-12-07 | Discharge: 2021-12-07 | Disposition: A | Discharge status: Home / Self Care | Payer: Medicare Other | Source: Ambulatory Visit | Attending: Nurse Practitioner | Admitting: Nurse Practitioner

## 2021-12-07 DIAGNOSIS — N3020 Other chronic cystitis without hematuria: Secondary | ICD-10-CM | POA: Diagnosis not present

## 2021-12-07 DIAGNOSIS — N130 Hydronephrosis with ureteropelvic junction obstruction: Secondary | ICD-10-CM | POA: Diagnosis not present

## 2021-12-07 DIAGNOSIS — N3289 Other specified disorders of bladder: Secondary | ICD-10-CM | POA: Diagnosis not present

## 2021-12-07 LAB — POCT I-STAT CREATININE: Creatinine, Ser: 0.8 mg/dL (ref 0.44–1.00)

## 2021-12-07 MED ORDER — SODIUM CHLORIDE (PF) 0.9 % IJ SOLN
INTRAMUSCULAR | Status: AC
  Filled 2021-12-07: qty 50

## 2021-12-07 MED ORDER — IOHEXOL 300 MG/ML  SOLN
100.0000 mL | Freq: Once | INTRAMUSCULAR | Status: AC | PRN
  Administered 2021-12-07: 100 mL via INTRAVENOUS

## 2021-12-08 DIAGNOSIS — Z20822 Contact with and (suspected) exposure to covid-19: Secondary | ICD-10-CM | POA: Diagnosis not present

## 2021-12-12 DIAGNOSIS — Z7901 Long term (current) use of anticoagulants: Secondary | ICD-10-CM | POA: Diagnosis not present

## 2021-12-14 ENCOUNTER — Non-Acute Institutional Stay: Payer: Medicare Other | Admitting: Family Medicine

## 2021-12-14 ENCOUNTER — Encounter: Payer: Self-pay | Admitting: Family Medicine

## 2021-12-14 VITALS — BP 110/56 | HR 72 | Resp 20

## 2021-12-14 DIAGNOSIS — Z515 Encounter for palliative care: Secondary | ICD-10-CM | POA: Diagnosis not present

## 2021-12-14 DIAGNOSIS — Z7901 Long term (current) use of anticoagulants: Secondary | ICD-10-CM | POA: Diagnosis not present

## 2021-12-14 DIAGNOSIS — N3000 Acute cystitis without hematuria: Secondary | ICD-10-CM | POA: Diagnosis not present

## 2021-12-14 NOTE — Progress Notes (Signed)
? ? ?Manufacturing engineer ?Community Palliative Care Consult Note ?Telephone: [phone]  ?Fax: [phone]  ? ? ?Date of encounter: 12/14/21 ?12:10 PM ?PATIENT NAME: Toni [name] ?[id number] ?[place] [address]   ?[phone] (home)  ?DOB: [birth year] ?MRN: [id number] ?PRIMARY CARE PROVIDER:    ?Toni Marble, MD,  ?[place] [address] ?[phone] ? ?REFERRING PROVIDER:   ?Toni Marble, MD ?[address] ?[place],  [place] [address] ?[phone] ? ?RESPONSIBLE PARTY:    ?Contact Information   ? ? Name Relation Home Work Mobile  ? [name],Toni Daughter [phone]  [phone]  ? [name],Toni Spouse [phone]  [phone]  ? [name],Toni Other [phone]  [phone]  ? ?  ? ? ? ?I met face to face with patient and her caregiver Toni [name] in [facility] facility. Palliative Care was asked to follow this patient by consultation request of  Toni Marble, MD to address advance care planning and complex medical decision making. This is a follow up visit. ? ?                                 ?ASSESSMENT, SYMPTOM MANAGEMENT AND PLAN / RECOMMENDATIONS:  ? Acute cystitis without hematuria ?Seeing Urogynecology who has pt on prophylactic antibiotics with Trimethoprim 100 mg once daily, vaginal estrogen cream 1 g twice weekly until most recent infection. ?Currently receiving IV antibiotics per facility PA through PICC line with 2 more doses today then PA will remove line.  ?Foley cath has recently been d/c'd. ? ?2.  Atrial fibrillation with Coumadin anticoagulation ?Rate controlled on Diltiazem 240 mg daily, anticoagulated on Warfarin with INR done today, results not yet available. ?No active bleeding, ? ? ?CODE STATUS: ?Full code ? ? ? ?Follow up Palliative Care Visit: Palliative care will continue to follow for complex medical decision making, advance care planning, and clarification of goals. Return 4 weeks or prn. ? ? ?This visit was coded based on medical decision  making (MDM). ? ?PPS: 40% ? ?HOSPICE ELIGIBILITY/DIAGNOSIS: TBD ? ?Chief Complaint:  ?Palliative Care is completing a follow up visit for patient with SPMS and recent UTI. ? ?HISTORY OF PRESENT ILLNESS:  Toni [name] is a 76 y.o. 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  Nausea has been present since she got Covid in Dec/Jan 2020.  The facility PA Toni [name] states she is currently being treated for UTI with resistant organism and she had placed PICC line for IV antibiotic access.  Pt follows with Urology and recently had foley catheter removed.  Pt states she only has 2 more doses to complete her antibiotic course, and has some itching peripheral to the site but denies pain or tenderness at insertion site.  She continues Zofran to help manage nausea.  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  States recently constipation has been better and she remains on Linzess.  Toni [name], her caregiver is present at the time of exam. Uses briefs during the day and has Purewick catheter for nighttime. ? ?History obtained from review of EMR, discussion with primary team, facility staff and interview with Toni [name]. I reviewed available labs, medications, imaging, studies and related documents from the EMR.  Records reviewed and summarized above.  ? ?ROS ? ?General: NAD ?EYES: denies vision changes ?ENMT: denies dysphagia ?Cardiovascular: denies chest pain, orthopnea or PND ?Pulmonary: denies cough, denies increased SOB ?  Abdomen: endorses fair appetite and constipation, endorses bowel continence ?GU:  denies dysuria, urge incontinence of urine at times ?MSK:  endorses increased weakness,  no falls reported ?Skin: denies rashes or wounds ?Neurological: denies insomnia, c/o abd cramping pain ?Psych: Endorses blunted mood ?Heme/lymph/immuno: denies bruises, abnormal bleeding ? ?Physical Exam: ?Current and past weights: 193 lbs as of  11/16/21 ?Constitutional: NAD, chronically ill appearing ?General: obese  ?EYES: anicteric sclera, lids intact, no discharge  ?ENMT: intact hearing, oral mucous membranes moist, dentition intact ?CV: S1S2, IRIR, 1+ non-pitting pedal edema  ?Pulmonary: CTAB, no increased work of breathing, no cough, room air ?Abdomen: normoactive BS + 4 quadrants, soft and non tender, no ascites ?GU: has purewick cath in place overnight ?MSK: no sarcopenia, moves all extremities ?Skin: warm and dry, no rashes or wounds on visible skin.  Has PICC line in left brachiocephalic vein with mild erythema at 6-8 o clock at base of insertion site, no drainage or tenderness noted ?Neuro:  no generalized weakness,  no cognitive impairment ?Psych: non-anxious, blunted affect, A and O x 3 ?Hem/lymph/immuno: no widespread bruising ? ? ?Thank you for the opportunity to participate in the care of Toni [name].  The palliative care team will continue to follow. Please call our office at [phone] if we can be of additional assistance.  ? ?Marijo Conception, FNP -C ? ?COVID-19 PATIENT SCREENING TOOL ?Asked and negative response unless otherwise noted:  ? ?Have you had symptoms of covid, tested positive or been in contact with someone with symptoms/positive test in the past 5-10 days?  No ?

## 2021-12-15 DIAGNOSIS — N390 Urinary tract infection, site not specified: Secondary | ICD-10-CM | POA: Diagnosis not present

## 2021-12-16 DIAGNOSIS — G35 Multiple sclerosis: Secondary | ICD-10-CM | POA: Diagnosis not present

## 2021-12-16 DIAGNOSIS — R279 Unspecified lack of coordination: Secondary | ICD-10-CM | POA: Diagnosis not present

## 2021-12-19 DIAGNOSIS — G35 Multiple sclerosis: Secondary | ICD-10-CM | POA: Diagnosis not present

## 2021-12-19 DIAGNOSIS — M1990 Unspecified osteoarthritis, unspecified site: Secondary | ICD-10-CM | POA: Diagnosis not present

## 2021-12-19 DIAGNOSIS — L258 Unspecified contact dermatitis due to other agents: Secondary | ICD-10-CM | POA: Diagnosis not present

## 2021-12-19 DIAGNOSIS — G4700 Insomnia, unspecified: Secondary | ICD-10-CM | POA: Diagnosis not present

## 2021-12-19 DIAGNOSIS — N1330 Unspecified hydronephrosis: Secondary | ICD-10-CM | POA: Diagnosis not present

## 2021-12-19 DIAGNOSIS — B372 Candidiasis of skin and nail: Secondary | ICD-10-CM | POA: Diagnosis not present

## 2021-12-19 DIAGNOSIS — I4891 Unspecified atrial fibrillation: Secondary | ICD-10-CM | POA: Diagnosis not present

## 2021-12-19 DIAGNOSIS — E559 Vitamin D deficiency, unspecified: Secondary | ICD-10-CM | POA: Diagnosis not present

## 2021-12-19 DIAGNOSIS — N390 Urinary tract infection, site not specified: Secondary | ICD-10-CM | POA: Diagnosis not present

## 2021-12-19 DIAGNOSIS — R279 Unspecified lack of coordination: Secondary | ICD-10-CM | POA: Diagnosis not present

## 2021-12-19 DIAGNOSIS — C649 Malignant neoplasm of unspecified kidney, except renal pelvis: Secondary | ICD-10-CM | POA: Diagnosis not present

## 2021-12-19 DIAGNOSIS — R110 Nausea: Secondary | ICD-10-CM | POA: Diagnosis not present

## 2021-12-19 DIAGNOSIS — B3749 Other urogenital candidiasis: Secondary | ICD-10-CM | POA: Diagnosis not present

## 2021-12-21 DIAGNOSIS — R279 Unspecified lack of coordination: Secondary | ICD-10-CM | POA: Diagnosis not present

## 2021-12-21 DIAGNOSIS — I4891 Unspecified atrial fibrillation: Secondary | ICD-10-CM | POA: Diagnosis not present

## 2021-12-21 DIAGNOSIS — G35 Multiple sclerosis: Secondary | ICD-10-CM | POA: Diagnosis not present

## 2021-12-23 DIAGNOSIS — E559 Vitamin D deficiency, unspecified: Secondary | ICD-10-CM | POA: Diagnosis not present

## 2021-12-23 DIAGNOSIS — M1990 Unspecified osteoarthritis, unspecified site: Secondary | ICD-10-CM | POA: Diagnosis not present

## 2021-12-23 DIAGNOSIS — I509 Heart failure, unspecified: Secondary | ICD-10-CM | POA: Diagnosis not present

## 2021-12-23 DIAGNOSIS — C649 Malignant neoplasm of unspecified kidney, except renal pelvis: Secondary | ICD-10-CM | POA: Diagnosis not present

## 2021-12-23 DIAGNOSIS — R279 Unspecified lack of coordination: Secondary | ICD-10-CM | POA: Diagnosis not present

## 2021-12-23 DIAGNOSIS — E039 Hypothyroidism, unspecified: Secondary | ICD-10-CM | POA: Diagnosis not present

## 2021-12-23 DIAGNOSIS — G35 Multiple sclerosis: Secondary | ICD-10-CM | POA: Diagnosis not present

## 2021-12-23 DIAGNOSIS — E785 Hyperlipidemia, unspecified: Secondary | ICD-10-CM | POA: Diagnosis not present

## 2021-12-23 DIAGNOSIS — I4891 Unspecified atrial fibrillation: Secondary | ICD-10-CM | POA: Diagnosis not present

## 2021-12-24 DIAGNOSIS — Z20822 Contact with and (suspected) exposure to covid-19: Secondary | ICD-10-CM | POA: Diagnosis not present

## 2021-12-26 DIAGNOSIS — F411 Generalized anxiety disorder: Secondary | ICD-10-CM | POA: Diagnosis not present

## 2021-12-26 DIAGNOSIS — G35 Multiple sclerosis: Secondary | ICD-10-CM | POA: Diagnosis not present

## 2021-12-26 DIAGNOSIS — G8929 Other chronic pain: Secondary | ICD-10-CM | POA: Diagnosis not present

## 2021-12-26 DIAGNOSIS — R279 Unspecified lack of coordination: Secondary | ICD-10-CM | POA: Diagnosis not present

## 2021-12-26 DIAGNOSIS — F329 Major depressive disorder, single episode, unspecified: Secondary | ICD-10-CM | POA: Diagnosis not present

## 2021-12-26 DIAGNOSIS — G4700 Insomnia, unspecified: Secondary | ICD-10-CM | POA: Diagnosis not present

## 2021-12-26 DIAGNOSIS — Z7901 Long term (current) use of anticoagulants: Secondary | ICD-10-CM | POA: Diagnosis not present

## 2021-12-28 DIAGNOSIS — G35 Multiple sclerosis: Secondary | ICD-10-CM | POA: Diagnosis not present

## 2021-12-28 DIAGNOSIS — R279 Unspecified lack of coordination: Secondary | ICD-10-CM | POA: Diagnosis not present

## 2021-12-30 DIAGNOSIS — G35 Multiple sclerosis: Secondary | ICD-10-CM | POA: Diagnosis not present

## 2021-12-30 DIAGNOSIS — R279 Unspecified lack of coordination: Secondary | ICD-10-CM | POA: Diagnosis not present

## 2022-01-02 DIAGNOSIS — G35 Multiple sclerosis: Secondary | ICD-10-CM | POA: Diagnosis not present

## 2022-01-02 DIAGNOSIS — R279 Unspecified lack of coordination: Secondary | ICD-10-CM | POA: Diagnosis not present

## 2022-01-03 DIAGNOSIS — I4891 Unspecified atrial fibrillation: Secondary | ICD-10-CM | POA: Diagnosis not present

## 2022-01-04 DIAGNOSIS — N3020 Other chronic cystitis without hematuria: Secondary | ICD-10-CM | POA: Diagnosis not present

## 2022-01-04 DIAGNOSIS — G35 Multiple sclerosis: Secondary | ICD-10-CM | POA: Diagnosis not present

## 2022-01-04 DIAGNOSIS — R279 Unspecified lack of coordination: Secondary | ICD-10-CM | POA: Diagnosis not present

## 2022-01-04 DIAGNOSIS — R3914 Feeling of incomplete bladder emptying: Secondary | ICD-10-CM | POA: Diagnosis not present

## 2022-01-05 DIAGNOSIS — C649 Malignant neoplasm of unspecified kidney, except renal pelvis: Secondary | ICD-10-CM | POA: Diagnosis not present

## 2022-01-05 DIAGNOSIS — R110 Nausea: Secondary | ICD-10-CM | POA: Diagnosis not present

## 2022-01-05 DIAGNOSIS — G8929 Other chronic pain: Secondary | ICD-10-CM | POA: Diagnosis not present

## 2022-01-05 DIAGNOSIS — N1330 Unspecified hydronephrosis: Secondary | ICD-10-CM | POA: Diagnosis not present

## 2022-01-05 DIAGNOSIS — N390 Urinary tract infection, site not specified: Secondary | ICD-10-CM | POA: Diagnosis not present

## 2022-01-05 DIAGNOSIS — G35 Multiple sclerosis: Secondary | ICD-10-CM | POA: Diagnosis not present

## 2022-01-05 DIAGNOSIS — L258 Unspecified contact dermatitis due to other agents: Secondary | ICD-10-CM | POA: Diagnosis not present

## 2022-01-05 DIAGNOSIS — B372 Candidiasis of skin and nail: Secondary | ICD-10-CM | POA: Diagnosis not present

## 2022-01-05 DIAGNOSIS — B3749 Other urogenital candidiasis: Secondary | ICD-10-CM | POA: Diagnosis not present

## 2022-01-06 DIAGNOSIS — R279 Unspecified lack of coordination: Secondary | ICD-10-CM | POA: Diagnosis not present

## 2022-01-06 DIAGNOSIS — G35 Multiple sclerosis: Secondary | ICD-10-CM | POA: Diagnosis not present

## 2022-01-09 DIAGNOSIS — G35 Multiple sclerosis: Secondary | ICD-10-CM | POA: Diagnosis not present

## 2022-01-09 DIAGNOSIS — R279 Unspecified lack of coordination: Secondary | ICD-10-CM | POA: Diagnosis not present

## 2022-01-11 DIAGNOSIS — Z7901 Long term (current) use of anticoagulants: Secondary | ICD-10-CM | POA: Diagnosis not present

## 2022-01-11 DIAGNOSIS — R279 Unspecified lack of coordination: Secondary | ICD-10-CM | POA: Diagnosis not present

## 2022-01-11 DIAGNOSIS — G35 Multiple sclerosis: Secondary | ICD-10-CM | POA: Diagnosis not present

## 2022-01-13 DIAGNOSIS — Z7901 Long term (current) use of anticoagulants: Secondary | ICD-10-CM | POA: Diagnosis not present

## 2022-01-16 DIAGNOSIS — I4891 Unspecified atrial fibrillation: Secondary | ICD-10-CM | POA: Diagnosis not present

## 2022-01-18 DIAGNOSIS — G35 Multiple sclerosis: Secondary | ICD-10-CM | POA: Diagnosis not present

## 2022-01-18 DIAGNOSIS — R5381 Other malaise: Secondary | ICD-10-CM | POA: Diagnosis not present

## 2022-01-18 DIAGNOSIS — L258 Unspecified contact dermatitis due to other agents: Secondary | ICD-10-CM | POA: Diagnosis not present

## 2022-01-18 DIAGNOSIS — K5900 Constipation, unspecified: Secondary | ICD-10-CM | POA: Diagnosis not present

## 2022-01-18 DIAGNOSIS — C649 Malignant neoplasm of unspecified kidney, except renal pelvis: Secondary | ICD-10-CM | POA: Diagnosis not present

## 2022-01-18 DIAGNOSIS — B372 Candidiasis of skin and nail: Secondary | ICD-10-CM | POA: Diagnosis not present

## 2022-01-18 DIAGNOSIS — E559 Vitamin D deficiency, unspecified: Secondary | ICD-10-CM | POA: Diagnosis not present

## 2022-01-18 DIAGNOSIS — G4700 Insomnia, unspecified: Secondary | ICD-10-CM | POA: Diagnosis not present

## 2022-01-18 DIAGNOSIS — R110 Nausea: Secondary | ICD-10-CM | POA: Diagnosis not present

## 2022-01-18 DIAGNOSIS — N390 Urinary tract infection, site not specified: Secondary | ICD-10-CM | POA: Diagnosis not present

## 2022-01-18 DIAGNOSIS — R279 Unspecified lack of coordination: Secondary | ICD-10-CM | POA: Diagnosis not present

## 2022-01-20 DIAGNOSIS — M6281 Muscle weakness (generalized): Secondary | ICD-10-CM | POA: Diagnosis not present

## 2022-01-20 DIAGNOSIS — I110 Hypertensive heart disease with heart failure: Secondary | ICD-10-CM | POA: Diagnosis not present

## 2022-01-20 DIAGNOSIS — I4891 Unspecified atrial fibrillation: Secondary | ICD-10-CM | POA: Diagnosis not present

## 2022-01-23 DIAGNOSIS — G8929 Other chronic pain: Secondary | ICD-10-CM | POA: Diagnosis not present

## 2022-01-23 DIAGNOSIS — G4700 Insomnia, unspecified: Secondary | ICD-10-CM | POA: Diagnosis not present

## 2022-01-23 DIAGNOSIS — F411 Generalized anxiety disorder: Secondary | ICD-10-CM | POA: Diagnosis not present

## 2022-01-23 DIAGNOSIS — M6281 Muscle weakness (generalized): Secondary | ICD-10-CM | POA: Diagnosis not present

## 2022-01-23 DIAGNOSIS — F329 Major depressive disorder, single episode, unspecified: Secondary | ICD-10-CM | POA: Diagnosis not present

## 2022-01-24 ENCOUNTER — Encounter: Payer: Self-pay | Admitting: Gastroenterology

## 2022-01-24 ENCOUNTER — Ambulatory Visit (INDEPENDENT_AMBULATORY_CARE_PROVIDER_SITE_OTHER): Payer: Medicare Other | Admitting: Gastroenterology

## 2022-01-24 VITALS — BP 130/80 | HR 70 | Ht 63.0 in

## 2022-01-24 DIAGNOSIS — R140 Abdominal distension (gaseous): Secondary | ICD-10-CM | POA: Diagnosis not present

## 2022-01-24 DIAGNOSIS — K3184 Gastroparesis: Secondary | ICD-10-CM | POA: Diagnosis not present

## 2022-01-24 DIAGNOSIS — K5909 Other constipation: Secondary | ICD-10-CM

## 2022-01-24 DIAGNOSIS — R110 Nausea: Secondary | ICD-10-CM | POA: Diagnosis not present

## 2022-01-24 DIAGNOSIS — I5032 Chronic diastolic (congestive) heart failure: Secondary | ICD-10-CM | POA: Diagnosis not present

## 2022-01-24 DIAGNOSIS — N319 Neuromuscular dysfunction of bladder, unspecified: Secondary | ICD-10-CM | POA: Diagnosis not present

## 2022-01-24 MED ORDER — MOTEGRITY 2 MG PO TABS
2.0000 mg | ORAL_TABLET | Freq: Every day | ORAL | 3 refills | Status: DC
Start: 2022-01-24 — End: 2022-07-20

## 2022-01-24 NOTE — Patient Instructions (Addendum)
It was my pleasure to provide care to you today. Based on our discussion, I am providing you with my recommendations below:  RECOMMENDATION(S):   Continue Omeprazole, Zofran, Compazine and Famotidine.   Taper off Zofran as able.   Stop Linzess.  PRESCRIPTION MEDICATION(S):   We have sent the following medication(s) to your pharmacy:  Motegrity 2 mg daily.   NOTE: If your medication(s) requires a PRIOR AUTHORIZATION, we will receive notification from your pharmacy. Once received, the process to submit for approval may take up to 7-10 business days. You will be contacted about any denials we have received from your insurance company as well as alternatives recommended by your provider.   COLOGUARD:  Your provider has ordered Cologuard testing as an option for colon cancer screening. This is performed by [employer] and may be out of network with your insurance. PRIOR to completing the test, it is YOUR responsibility to contact your insurance about covered benefits for this test. Your out of pocket expense could be anywhere from $0.00 to $649.00.   When you call to check coverage with your insurer, please provide the following information:   -The ONLY provider of Cologuard is [place] code for Cologuard is [phone].  [employer] NPI # [id number]  -[employer] Tax ID # [id number]   We have already sent your demographic and insurance information to [employer] (phone number [phone]) and they should contact you within the next week regarding your test. If you have not heard from them within the next week, please call our office at [phone].  FOLLOW UP:  I would like for you to follow up with me as needed. Please call the office at [phone] to schedule your appointment.  BMI:  If you are age 60 or older, your body mass index should be between 23-30. Your Body mass index is 34.19 kg/m. If this is out of the  aforementioned range listed, please consider follow up with your Primary Care Provider.  If you are age 76 or younger, your body mass index should be between 19-25. Your Body mass index is 34.19 kg/m. If this is out of the aformentioned range listed, please consider follow up with your Primary Care Provider.   MY CHART:  The [facility] providers would like to encourage you to use [facility] to communicate with providers for non-urgent requests or questions.  Due to long hold times on the telephone, sending your provider a message by [facility] may be a faster and more efficient way to get a response.  Please allow 48 business hours for a response.  Please remember that this is for non-urgent requests.   Thank you for trusting me with your gastrointestinal care!    Thornton Park, MD, MPH

## 2022-01-24 NOTE — Progress Notes (Addendum)
Referring Provider: Jodi Marble, MD Primary Care Physician:  Jodi Marble, MD   Chief Complaint:  Nausea   IMPRESSION:  Nausea following COVID    - post-infectious nausea versus gastroparesis    - discussed possible etiology and prognosis     - clinically improved on current regimen    - she will try to reduce her dosing of Zofran Chronic constipation with sense of incomplete evacuation    - history of impaction    -  Metamucil QD, continued her lactulose BID, frequent stool softener at night, Linzess 145 mcg daily LLQ pain as the constipation progresses, improved on Linzess    - ? Related to diverticulosis Reflux controlled on daily PPI and H2blocker    - EGD 03/15/09: candideal pharyngitis and nonerosive esophagitis Normal colonoscopy 14 years ago [facility])    - discussed colon cancer screening options    - high risk for endoscopy given age, comorbidities and anticogulation    - wishes to avoid endoscopy if needed due to anticoagulation    - however, she would proceed with colonoscopy if Cologuard is positive History of rectal bleeding due to hemorrhoids    - treated with Anusol HC suppository 1 twice daily for 1 week Celiac artery stenosis with poststenotic dilatation  Diverticulitis distantly MS x 30 years, wheelchair bound IFOB negative 02/06/19 BMI 34  PLAN: - Continue omeprazole 40 mg QAM, Zofran, Compazine, and famotidine 20 mg BID - Taper Zofran to lowest dose possible - Continue Metamucil, lactulose, stool softener - Start Motegrity 2g daily instead of Linzess as this may help with both constipation and suspected gastroparesis if symptoms worsen - Resume Linzess if the Motegrity is ineffective - Low threshold to consider gastric emptying scan - Ideally would consider pelvic floor PT given suspected pelvic floor dysfunction - Cologuard - Office follow-up PRN   HPI: Toni [name] is a 76 y.o. female who returns in follow-up.  She was last seen by  Ellouise Newer 11/16/21. She returns today with LLQ pain and nausea. Her private caregiver accompanies her to this appointment.     2006 colonoscopy normal with Dr. Earlean Shawl.      03/15/2009 EGD with candidal pharyngitis and nonerosive esophagitis.    02/14/2019 patient seen via telemedicine for constipation.  At that time discussed history of impaction.  She was told to use Metamucil once daily and continue Lactulose twice daily and Linzess 145 mcg daily.  Her reflux was controlled on a daily PPI and H2 blocker.  Noted previous rectal bleeding treated with Anusol suppositories twice daily for a week.  Discussed MS for 30 years and wheelchair-bound.  Noted that she had chronic constipation which was some better with Metamucil, Lactulose, stool softener and Linzess.  It was recommended she have a Colonoscopy prior to anorectal manometry.  Recommendations for Warfarin washout at the hospital and colonoscopy after.    11/08/2021 abdominal x-ray with moderate gaseous distention consistent with adynamic ileus and moderate fecal material throughout the colon.  No evidence of bowel obstruction.    11/10/2021 x-ray of the abdomen showed markedly limited, moderate gaseous distention of the bowel loops compatible with adynamic ileus.    11/16/21 office visit for nausea which developed with COVID in January 2020. 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 Suffers from chronic bloating and distention at times.  Returns today with improved symptoms on standing Zofran, omeprazole 20 mg iQM, and  famotidine 20 mg at night.  She has not noticed any overt heartburn or reflux symptoms.  No vomiting. Her constipation is very responsive to Linzess. Bowel habits fluctuate dramatically based on the timing of her Linzess PRN, Lactulose QD to BID, stool softeners QD and Metamucil BID. Her diet is currently regular.   She is followed by  urogynecology and palliative care for acute cystitis with hematuria and atrial fibrillation on warfarin.  She specifically asks about options for colon cancer screening. She would like to avoid colonoscopy but would have one at the hospital if needed.   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 She had a CT of the abdomen and pelvis without contrast 12/08/2021 that showed cystitis.  No intestinal abnormalities identified.       Past Medical History:  Diagnosis Date   Abnormality of gait 03/01/2015   Anxiety     Arthritis      "knees" (01/19/2016)   Asthma      as a child    Back pain     Bilateral carpal tunnel syndrome     There is no known family history of colon cancer or polyps. No family history of stomach cancer or other GI malignancy. No family history of inflammatory bowel disease or celiac.    Past Medical History:  Diagnosis Date   Abnormality of gait 03/01/2015   Anxiety    Arthritis    "knees" (01/19/2016)   Asthma    as a child    Back pain    Bilateral carpal tunnel syndrome 10/04/2020   Cancer ([place])    / RENAL CELL CARCINOMA - GETS CT SCAN EVERY 6 MONTHS TO WATCH    CHF (congestive heart failure) (HCC)    Chronic pain    "nerve pain from the MS"   Complex atypical endometrial hyperplasia    Constipation    Depression    DVT (deep venous thrombosis) ([place]) 1983   "RLE; may have just been phlebitis; it was before the age of dopplers"   Dysrhythmia    afib    Edema    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' ablation of R GSV 2012   GERD (gastroesophageal reflux disease)    HLD (hyperlipidemia)    Hypertension    Hypotension    Hypothyroidism    Joint pain    Multiple sclerosis ([place])    has had this may 1989-[place] reg doc   OSA on CPAP    bipap machine - SLEEP APNEA WORSE  SINCE COVID IN 1/21 PER PATIENT SETTING HAS INCREASED FROM 9 TO 30    Osteoarthritis    PAF (paroxysmal atrial fibrillation) ([place])    on coumadin; documented on monitor 06/2010 - no longer takes per [facility] 02/09/21   Pneumonia 11/2011   Sleep apnea    uses sleep apnea machine    Past Surgical History:  Procedure Laterality Date   ANTERIOR CERVICAL DECOMP/DISCECTOMY FUSION  01/2004   CARDIAC CATHETERIZATION  06/22/2010   normal coronary arteries, PAF   CATARACT EXTRACTION, BILATERAL Bilateral    CHILD BIRTHS     X2   DILATATION & CURETTAGE/HYSTEROSCOPY WITH MYOSURE N/A 01/15/2020   Procedure: DILATATION & CURETTAGE/HYSTEROSCOPY WITH MYOSURE;  Surgeon: Arvella Nigh, MD;  Location: [facility];  Service: Gynecology;  Laterality: N/A;  pt in  wheelchair-has MS   DILATION AND CURETTAGE OF UTERUS     DILATION AND CURETTAGE OF UTERUS N/A 02/16/2020   Procedure: DILATATION AND CURETTAGE;  Surgeon: Lafonda Mosses, MD;  Location: [facility];  Service: Gynecology;  Laterality: N/A;  NOT GENERAL -NO INTUBATION   INFUSION PUMP IMPLANTATION  ~ 2009   baclofen infusion in lower abd   INTRATHECAL PUMP REVISION N/A 02/18/2021   Procedure: Baclofen Pump replacement with possible revision of catheter;  Surgeon: Erline Levine, MD;  Location: [place];  Service: Neurosurgery;  Laterality: N/A;  3C/RM 20   INTRAUTERINE DEVICE (IUD) INSERTION N/A 02/16/2020   Procedure: INTRAUTERINE DEVICE (IUD) INSERTION - MIRENA;  Surgeon: Lafonda Mosses, MD;  Location: [facility];  Service: Gynecology;  Laterality: N/A;   PAIN PUMP REVISION N/A 07/29/2014   Procedure: Baclofen pump replacement;  Surgeon: Erline Levine, MD;  Location: [place] [facility];  Service: Neurosurgery;  Laterality: N/A;  Baclofen pump replacement   TONSILLECTOMY AND ADENOIDECTOMY     VARICOSE VEIN SURGERY  ~ 1968   VENOUS ABLATION  12/16/2010   radiofreq ablation -Dr Elisabeth Cara and [facility]   WISDOM TOOTH EXTRACTION       Current Outpatient Medications   Medication Sig Dispense Refill   acetaminophen (TYLENOL) 500 MG tablet Take 1,000 mg by mouth every 6 (six) hours as needed (fever/headache/pain).     ALPRAZolam (XANAX) 0.5 MG tablet TAKE 1 TABLET BY MOUTH 3  TIMES DAILY AS NEEDED FOR  ANXIETY AND SLEEP (Patient taking differently: Take 0.5 mg by mouth 3 (three) times daily as needed for anxiety or sleep.) 90 tablet 1   ARTIFICIAL TEARS 0.2-0.2-1 % SOLN Apply to eye.     Ascorbic Acid (VITAMIN C) 1000 MG tablet Take 1,000 mg by mouth 4 (four) times daily.     Biotin 10000 MCG TABS Take 10,000 mcg by mouth daily after lunch.     buPROPion (WELLBUTRIN XL) 150 MG 24 hr tablet Take 1 tablet (150 mg total) by mouth daily. (Patient taking differently: Take 150 mg by mouth daily. After breakfest) 90 tablet 1   Calcium Carbonate-Vitamin D3 600-400 MG-UNIT TABS Take 1 tablet by mouth in the morning and at bedtime. (After breakfast & after lunch)     cetirizine (ZYRTEC) 10 MG tablet Take 10 mg by mouth daily as needed for allergies.      Cholecalciferol (VITAMIN D3) 50 MCG (2000 UT) TABS Take 2,000 Units by mouth in the morning and at bedtime. (After breakfast & after lunch)     Coenzyme Q10 (COQ10 PO) Take by mouth daily in the afternoon.     corticotropin (ACTHAR) 80 UNIT/ML injectable gel inject 1 ml into the muscle every other day for 10 days each month (Patient not taking: Reported on 11/16/2021) 5 mL 5   diclofenac Sodium (VOLTAREN) 1 % GEL Apply 2 g topically 3 (three) times daily as needed. (Patient taking differently: Apply 2 g topically 3 (three) times daily as needed (pain (applied to bilateral shoulders)).) 100 g 3   diltiazem (CARDIZEM CD) 240 MG 24 hr capsule Take 240 mg by mouth daily.     docusate sodium (COLACE) 100 MG capsule Take 1 capsule (100 mg total) by mouth daily. (Patient taking differently: Take 100 mg by mouth 2 (two) times daily.) 10 capsule 0   escitalopram (LEXAPRO) 10 MG tablet Take 1 tablet (10 mg total) by mouth daily.  (Patient taking differently: Take 10 mg by mouth at bedtime.) 90 tablet 1  famotidine (PEPCID) 20 MG tablet Take 20 mg by mouth daily.     furosemide (LASIX) 20 MG tablet Take 2 tablets (40 mg total) by mouth in the morning, at noon, and at bedtime. (Patient taking differently: Take 60 mg by mouth 2 (two) times daily.) 30 tablet 0   gabapentin (NEURONTIN) 300 MG capsule Mrs. Broadwater will take 300 mg of gabapentin at 6 AM, 10 AM, 2 PM and 6 PM and 600 mg at 10 PM by mouth. 180 capsule 11   gabapentin (NEURONTIN) 600 MG tablet Take 600 mg by mouth at bedtime.     lactobacillus acidophilus (BACID) TABS tablet Take 1 tablet by mouth 2 (two) times daily as needed.     lactulose (CHRONULAC) 10 GM/15ML solution as needed.     levothyroxine (SYNTHROID, LEVOTHROID) 75 MCG tablet Take 75 mcg by mouth daily before breakfast. (0500)     linaclotide (LINZESS) 145 MCG CAPS capsule Take 1 capsule (145 mcg total) by mouth daily before breakfast. (Patient taking differently: Take 145 mcg by mouth daily before breakfast. (0500)) 90 capsule 3   melatonin 5 MG TABS Take 5 mg by mouth at bedtime.     Menthol-Zinc Oxide (REMEDY CALAZIME) 0.4-20.5 % PSTE Apply 1 application topically 3 (three) times daily as needed (buttocks/sacrum with incontinences care episodes).     metolazone (ZAROXOLYN) 2.5 MG tablet Take 2.5 mg by mouth daily.     Multiple Vitamin (MULTIVITAMIN WITH MINERALS) TABS tablet Take 1 tablet by mouth daily after lunch.     neomycin-bacitracin-polymyxin (NEOSPORIN) ointment Apply 1 application topically 2 (two) times daily as needed for wound care (applied to nose (wear mask rubs)).     NON FORMULARY ASV breathing machine is to be used while napping & at bedtime. Fill canister up to max line with distilled water and turn concentrator to 5LPM.     NYAMYC powder Apply topically.     omeprazole (PRILOSEC) 40 MG capsule Take 1 capsule (40 mg total) by mouth every morning. 30 capsule 11   oxybutynin (DITROPAN) 5  MG tablet Take 5 mg by mouth 3 (three) times daily as needed for bladder spasms.     phenazopyridine (PYRIDIUM) 100 MG tablet Take 100 mg by mouth 3 (three) times daily.     potassium chloride SA (KLOR-CON) 20 MEQ tablet Take 2 tablets (40 mEq total) by mouth daily. (Patient taking differently: Take 20 mEq by mouth 2 (two) times daily. (After lunch & after dinner)) 180 tablet 3   prochlorperazine (COMPAZINE) 5 MG tablet Take 5 mg by mouth daily as needed for nausea or vomiting.     psyllium (METAMUCIL SMOOTH TEXTURE) 28 % packet Take 1 packet by mouth at bedtime.     simvastatin (ZOCOR) 10 MG tablet Take 10 mg by mouth at bedtime.     SYRINGE/NEEDLE, DISP, 1 ML (BD LUER-LOK SYRINGE) 25G X 5/8" 1 ML MISC Use as directed to inject  [place] (Patient not taking: Reported on 11/16/2021) 10 each prn   warfarin (COUMADIN) 4 MG tablet Take 4 mg by mouth daily.     zinc oxide 20 % ointment Apply 1 application topically as needed for irritation (apply labia every shift as needed for redness).     No current facility-administered medications for this visit.    Allergies as of 01/24/2022 - Review Complete 12/14/2021  Allergen Reaction Noted   Other Rash 01/03/2021   Chlorhexidine Hives and Other (See Comments) 10/10/2019   Hydrocodone Nausea And Vomiting and Other (  See Comments) 11/26/2011   Oxycodone Nausea And Vomiting and Other (See Comments) 05/12/2013   Sertraline Hives, Swelling, Rash, and Other (See Comments) 01/29/2012   Macrobid [nitrofurantoin]  09/17/2020   Phenol Hives 03/09/2021   Rofecoxib  09/17/2020    Family History  Problem Relation Age of Onset   Coronary artery disease Father        at age 62   Hyperlipidemia Father    Thyroid disease Father    Coronary artery disease Maternal Grandmother    Depression Maternal Grandmother    Cancer Paternal Grandmother        breast   Depression Mother    Sudden death Mother    Anxiety disorder Mother    Alcoholism Son       Physical  Exam: General:   Alert,  well-nourished, pleasant and cooperative in NAD, examined in a wheelchair Head:  Normocephalic and atraumatic. Eyes:  Sclera clear, no icterus.   Conjunctiva pink. Abdomen:  Soft, nontender, nondistended, normal bowel sounds, no rebound or guarding. No hepatosplenomegaly.   Neurologic:  Alert and  oriented x4;  grossly nonfocal Skin:  Intact without significant lesions or rashes. Psych:  Alert and cooperative. Normal mood and affect.  I spent over 40 minutes, including in depth chart review, independent review of results, communicating results with the patient directly, face-to-face time with the patient, coordinating care, ordering studies and medications as appropriate, and documentation.   Naturi Alarid L. Tarri Glenn, MD, MPH 01/24/2022, 10:22 AM

## 2022-01-27 DIAGNOSIS — M6281 Muscle weakness (generalized): Secondary | ICD-10-CM | POA: Diagnosis not present

## 2022-01-27 DIAGNOSIS — I480 Paroxysmal atrial fibrillation: Secondary | ICD-10-CM | POA: Diagnosis not present

## 2022-01-28 DIAGNOSIS — I5020 Unspecified systolic (congestive) heart failure: Secondary | ICD-10-CM | POA: Diagnosis not present

## 2022-01-28 DIAGNOSIS — R791 Abnormal coagulation profile: Secondary | ICD-10-CM | POA: Diagnosis not present

## 2022-01-30 DIAGNOSIS — Z7901 Long term (current) use of anticoagulants: Secondary | ICD-10-CM | POA: Diagnosis not present

## 2022-02-02 DIAGNOSIS — G35 Multiple sclerosis: Secondary | ICD-10-CM | POA: Diagnosis not present

## 2022-02-02 DIAGNOSIS — I4891 Unspecified atrial fibrillation: Secondary | ICD-10-CM | POA: Diagnosis not present

## 2022-02-02 DIAGNOSIS — E039 Hypothyroidism, unspecified: Secondary | ICD-10-CM | POA: Diagnosis not present

## 2022-02-02 DIAGNOSIS — E785 Hyperlipidemia, unspecified: Secondary | ICD-10-CM | POA: Diagnosis not present

## 2022-02-02 DIAGNOSIS — K219 Gastro-esophageal reflux disease without esophagitis: Secondary | ICD-10-CM | POA: Diagnosis not present

## 2022-02-06 DIAGNOSIS — I4891 Unspecified atrial fibrillation: Secondary | ICD-10-CM | POA: Diagnosis not present

## 2022-02-08 DIAGNOSIS — I4891 Unspecified atrial fibrillation: Secondary | ICD-10-CM | POA: Diagnosis not present

## 2022-02-08 DIAGNOSIS — E559 Vitamin D deficiency, unspecified: Secondary | ICD-10-CM | POA: Diagnosis not present

## 2022-02-08 DIAGNOSIS — I509 Heart failure, unspecified: Secondary | ICD-10-CM | POA: Diagnosis not present

## 2022-02-08 DIAGNOSIS — E785 Hyperlipidemia, unspecified: Secondary | ICD-10-CM | POA: Diagnosis not present

## 2022-02-08 DIAGNOSIS — C649 Malignant neoplasm of unspecified kidney, except renal pelvis: Secondary | ICD-10-CM | POA: Diagnosis not present

## 2022-02-08 DIAGNOSIS — E039 Hypothyroidism, unspecified: Secondary | ICD-10-CM | POA: Diagnosis not present

## 2022-02-08 DIAGNOSIS — M1990 Unspecified osteoarthritis, unspecified site: Secondary | ICD-10-CM | POA: Diagnosis not present

## 2022-02-12 DIAGNOSIS — I10 Essential (primary) hypertension: Secondary | ICD-10-CM | POA: Diagnosis not present

## 2022-02-13 ENCOUNTER — Ambulatory Visit (INDEPENDENT_AMBULATORY_CARE_PROVIDER_SITE_OTHER): Payer: Medicare Other | Admitting: Neurology

## 2022-02-13 DIAGNOSIS — G35 Multiple sclerosis: Secondary | ICD-10-CM | POA: Diagnosis not present

## 2022-02-13 DIAGNOSIS — I509 Heart failure, unspecified: Secondary | ICD-10-CM | POA: Diagnosis not present

## 2022-02-13 DIAGNOSIS — R110 Nausea: Secondary | ICD-10-CM | POA: Diagnosis not present

## 2022-02-13 DIAGNOSIS — I4891 Unspecified atrial fibrillation: Secondary | ICD-10-CM | POA: Diagnosis not present

## 2022-02-13 DIAGNOSIS — N390 Urinary tract infection, site not specified: Secondary | ICD-10-CM | POA: Diagnosis not present

## 2022-02-13 DIAGNOSIS — K219 Gastro-esophageal reflux disease without esophagitis: Secondary | ICD-10-CM | POA: Diagnosis not present

## 2022-02-13 DIAGNOSIS — R252 Cramp and spasm: Secondary | ICD-10-CM | POA: Diagnosis not present

## 2022-02-13 DIAGNOSIS — Z978 Presence of other specified devices: Secondary | ICD-10-CM | POA: Diagnosis not present

## 2022-02-13 DIAGNOSIS — K5900 Constipation, unspecified: Secondary | ICD-10-CM | POA: Diagnosis not present

## 2022-02-13 DIAGNOSIS — E785 Hyperlipidemia, unspecified: Secondary | ICD-10-CM | POA: Diagnosis not present

## 2022-02-13 DIAGNOSIS — E039 Hypothyroidism, unspecified: Secondary | ICD-10-CM | POA: Diagnosis not present

## 2022-02-13 DIAGNOSIS — M62838 Other muscle spasm: Secondary | ICD-10-CM

## 2022-02-20 DIAGNOSIS — F411 Generalized anxiety disorder: Secondary | ICD-10-CM | POA: Diagnosis not present

## 2022-02-20 DIAGNOSIS — F329 Major depressive disorder, single episode, unspecified: Secondary | ICD-10-CM | POA: Diagnosis not present

## 2022-02-20 DIAGNOSIS — G4700 Insomnia, unspecified: Secondary | ICD-10-CM | POA: Diagnosis not present

## 2022-02-20 DIAGNOSIS — G8929 Other chronic pain: Secondary | ICD-10-CM | POA: Diagnosis not present

## 2022-02-20 DIAGNOSIS — I4891 Unspecified atrial fibrillation: Secondary | ICD-10-CM | POA: Diagnosis not present

## 2022-02-22 DIAGNOSIS — E559 Vitamin D deficiency, unspecified: Secondary | ICD-10-CM | POA: Diagnosis not present

## 2022-02-22 DIAGNOSIS — E785 Hyperlipidemia, unspecified: Secondary | ICD-10-CM | POA: Diagnosis not present

## 2022-02-22 DIAGNOSIS — G35 Multiple sclerosis: Secondary | ICD-10-CM | POA: Diagnosis not present

## 2022-02-22 DIAGNOSIS — R0689 Other abnormalities of breathing: Secondary | ICD-10-CM | POA: Diagnosis not present

## 2022-02-22 DIAGNOSIS — I4891 Unspecified atrial fibrillation: Secondary | ICD-10-CM | POA: Diagnosis not present

## 2022-02-22 DIAGNOSIS — R300 Dysuria: Secondary | ICD-10-CM | POA: Diagnosis not present

## 2022-02-22 DIAGNOSIS — K219 Gastro-esophageal reflux disease without esophagitis: Secondary | ICD-10-CM | POA: Diagnosis not present

## 2022-02-22 DIAGNOSIS — R110 Nausea: Secondary | ICD-10-CM | POA: Diagnosis not present

## 2022-02-22 DIAGNOSIS — G4700 Insomnia, unspecified: Secondary | ICD-10-CM | POA: Diagnosis not present

## 2022-02-22 DIAGNOSIS — M1990 Unspecified osteoarthritis, unspecified site: Secondary | ICD-10-CM | POA: Diagnosis not present

## 2022-02-22 DIAGNOSIS — E039 Hypothyroidism, unspecified: Secondary | ICD-10-CM | POA: Diagnosis not present

## 2022-02-22 DIAGNOSIS — C649 Malignant neoplasm of unspecified kidney, except renal pelvis: Secondary | ICD-10-CM | POA: Diagnosis not present

## 2022-02-22 DIAGNOSIS — I509 Heart failure, unspecified: Secondary | ICD-10-CM | POA: Diagnosis not present

## 2022-02-22 DIAGNOSIS — N390 Urinary tract infection, site not specified: Secondary | ICD-10-CM | POA: Diagnosis not present

## 2022-02-23 DIAGNOSIS — N390 Urinary tract infection, site not specified: Secondary | ICD-10-CM | POA: Diagnosis not present

## 2022-02-23 DIAGNOSIS — R0989 Other specified symptoms and signs involving the circulatory and respiratory systems: Secondary | ICD-10-CM | POA: Diagnosis not present

## 2022-02-23 DIAGNOSIS — I4891 Unspecified atrial fibrillation: Secondary | ICD-10-CM | POA: Diagnosis not present

## 2022-02-23 DIAGNOSIS — E119 Type 2 diabetes mellitus without complications: Secondary | ICD-10-CM | POA: Diagnosis not present

## 2022-02-25 ENCOUNTER — Telehealth: Payer: Self-pay | Admitting: Neurology

## 2022-02-25 DIAGNOSIS — G35 Multiple sclerosis: Secondary | ICD-10-CM | POA: Diagnosis not present

## 2022-02-25 DIAGNOSIS — G8929 Other chronic pain: Secondary | ICD-10-CM | POA: Diagnosis not present

## 2022-02-25 DIAGNOSIS — I4891 Unspecified atrial fibrillation: Secondary | ICD-10-CM | POA: Diagnosis not present

## 2022-02-26 NOTE — Telephone Encounter (Signed)
Patient's baclofen pump is beeping she is getting stiff. Taking baclofen oral. Dr. Felecia Shelling mentioned he may be able to get her in Monday to evaluate.

## 2022-02-27 ENCOUNTER — Ambulatory Visit (INDEPENDENT_AMBULATORY_CARE_PROVIDER_SITE_OTHER): Payer: Self-pay | Admitting: Neurology

## 2022-02-27 ENCOUNTER — Encounter: Payer: Self-pay | Admitting: Neurology

## 2022-02-27 VITALS — BP 107/56 | HR 85 | Ht 63.0 in

## 2022-02-27 DIAGNOSIS — R252 Cramp and spasm: Secondary | ICD-10-CM

## 2022-02-27 DIAGNOSIS — M62838 Other muscle spasm: Secondary | ICD-10-CM

## 2022-02-27 DIAGNOSIS — Z7901 Long term (current) use of anticoagulants: Secondary | ICD-10-CM | POA: Diagnosis not present

## 2022-02-27 DIAGNOSIS — Z978 Presence of other specified devices: Secondary | ICD-10-CM

## 2022-02-27 DIAGNOSIS — G35 Multiple sclerosis: Secondary | ICD-10-CM

## 2022-02-27 DIAGNOSIS — J811 Chronic pulmonary edema: Secondary | ICD-10-CM | POA: Diagnosis not present

## 2022-02-27 NOTE — Progress Notes (Signed)
              Here for baclofen pump reprogram. Alarm going off. Reservoir showing empty. Tablet did not reprogram to 64m in error when pt here last for refill on 02/13/22. Changed to reflect 384min reservoir. Next pump refill 05/19/22. Pump functioning properly.  Daily dose increased by 5% per Dr. SaFelecia Shellingrder.

## 2022-02-27 NOTE — Telephone Encounter (Signed)
Called the patient back and offered her an appt today to come in and assess the pump. I advised that when she was here the tablet did not save the volume and it may be that her pump is thinking it is out even though its not. We may have to adjust the volume to correct that to help it stop beeping. Pt states that she has also been having some tightness sensation along with feeling "hot". Denies fever. She will come in today and we will take a look at the pump. Pt verbalized understanding.

## 2022-02-27 NOTE — Progress Notes (Signed)
Ms. Gagliano came in today because her baclofen pump was alarming.  She had a pump refill about 2 weeks ago.  The pump read no fluid remaining though it was filled with 40 cc.  Her pump was accessed and 36 cc of clear fluid consistent with the baclofen solution was withdrawn and then reinstilled.  The pump was reprogrammed with the current volume of 36 cc.  Additionally her dose was increased by 5% as she has had some more spasticity.  She will return as scheduled for her next pump refill.

## 2022-02-27 NOTE — Telephone Encounter (Signed)
Pt is asking for a call from RN to discuss a date and time to come in to address the beeping of the  baclofen pump , please call

## 2022-02-28 DIAGNOSIS — I4891 Unspecified atrial fibrillation: Secondary | ICD-10-CM | POA: Diagnosis not present

## 2022-03-01 DIAGNOSIS — H04123 Dry eye syndrome of bilateral lacrimal glands: Secondary | ICD-10-CM | POA: Diagnosis not present

## 2022-03-01 DIAGNOSIS — G35 Multiple sclerosis: Secondary | ICD-10-CM | POA: Diagnosis not present

## 2022-03-01 DIAGNOSIS — R0989 Other specified symptoms and signs involving the circulatory and respiratory systems: Secondary | ICD-10-CM | POA: Diagnosis not present

## 2022-03-01 DIAGNOSIS — R110 Nausea: Secondary | ICD-10-CM | POA: Diagnosis not present

## 2022-03-01 DIAGNOSIS — N390 Urinary tract infection, site not specified: Secondary | ICD-10-CM | POA: Diagnosis not present

## 2022-03-01 DIAGNOSIS — R140 Abdominal distension (gaseous): Secondary | ICD-10-CM | POA: Diagnosis not present

## 2022-03-01 DIAGNOSIS — E039 Hypothyroidism, unspecified: Secondary | ICD-10-CM | POA: Diagnosis not present

## 2022-03-01 DIAGNOSIS — I4891 Unspecified atrial fibrillation: Secondary | ICD-10-CM | POA: Diagnosis not present

## 2022-03-01 DIAGNOSIS — C649 Malignant neoplasm of unspecified kidney, except renal pelvis: Secondary | ICD-10-CM | POA: Diagnosis not present

## 2022-03-01 DIAGNOSIS — K5900 Constipation, unspecified: Secondary | ICD-10-CM | POA: Diagnosis not present

## 2022-03-02 DIAGNOSIS — I4891 Unspecified atrial fibrillation: Secondary | ICD-10-CM | POA: Diagnosis not present

## 2022-03-02 DIAGNOSIS — R5381 Other malaise: Secondary | ICD-10-CM | POA: Diagnosis not present

## 2022-03-02 DIAGNOSIS — R140 Abdominal distension (gaseous): Secondary | ICD-10-CM | POA: Diagnosis not present

## 2022-03-02 DIAGNOSIS — R110 Nausea: Secondary | ICD-10-CM | POA: Diagnosis not present

## 2022-03-02 DIAGNOSIS — E559 Vitamin D deficiency, unspecified: Secondary | ICD-10-CM | POA: Diagnosis not present

## 2022-03-02 DIAGNOSIS — G8929 Other chronic pain: Secondary | ICD-10-CM | POA: Diagnosis not present

## 2022-03-02 DIAGNOSIS — C649 Malignant neoplasm of unspecified kidney, except renal pelvis: Secondary | ICD-10-CM | POA: Diagnosis not present

## 2022-03-02 DIAGNOSIS — H04123 Dry eye syndrome of bilateral lacrimal glands: Secondary | ICD-10-CM | POA: Diagnosis not present

## 2022-03-02 DIAGNOSIS — G35 Multiple sclerosis: Secondary | ICD-10-CM | POA: Diagnosis not present

## 2022-03-02 DIAGNOSIS — R0989 Other specified symptoms and signs involving the circulatory and respiratory systems: Secondary | ICD-10-CM | POA: Diagnosis not present

## 2022-03-02 DIAGNOSIS — N390 Urinary tract infection, site not specified: Secondary | ICD-10-CM | POA: Diagnosis not present

## 2022-03-02 DIAGNOSIS — E039 Hypothyroidism, unspecified: Secondary | ICD-10-CM | POA: Diagnosis not present

## 2022-03-03 DIAGNOSIS — K567 Ileus, unspecified: Secondary | ICD-10-CM | POA: Diagnosis not present

## 2022-03-03 DIAGNOSIS — I4891 Unspecified atrial fibrillation: Secondary | ICD-10-CM | POA: Diagnosis not present

## 2022-03-04 DIAGNOSIS — K567 Ileus, unspecified: Secondary | ICD-10-CM | POA: Diagnosis not present

## 2022-03-05 DIAGNOSIS — K5900 Constipation, unspecified: Secondary | ICD-10-CM | POA: Diagnosis not present

## 2022-03-05 DIAGNOSIS — K560 Paralytic ileus: Secondary | ICD-10-CM | POA: Diagnosis not present

## 2022-03-06 DIAGNOSIS — C649 Malignant neoplasm of unspecified kidney, except renal pelvis: Secondary | ICD-10-CM | POA: Diagnosis not present

## 2022-03-06 DIAGNOSIS — G8929 Other chronic pain: Secondary | ICD-10-CM | POA: Diagnosis not present

## 2022-03-06 DIAGNOSIS — N390 Urinary tract infection, site not specified: Secondary | ICD-10-CM | POA: Diagnosis not present

## 2022-03-06 DIAGNOSIS — K567 Ileus, unspecified: Secondary | ICD-10-CM | POA: Diagnosis not present

## 2022-03-06 DIAGNOSIS — G4700 Insomnia, unspecified: Secondary | ICD-10-CM | POA: Diagnosis not present

## 2022-03-06 DIAGNOSIS — E559 Vitamin D deficiency, unspecified: Secondary | ICD-10-CM | POA: Diagnosis not present

## 2022-03-06 DIAGNOSIS — I4891 Unspecified atrial fibrillation: Secondary | ICD-10-CM | POA: Diagnosis not present

## 2022-03-06 DIAGNOSIS — R110 Nausea: Secondary | ICD-10-CM | POA: Diagnosis not present

## 2022-03-06 DIAGNOSIS — E785 Hyperlipidemia, unspecified: Secondary | ICD-10-CM | POA: Diagnosis not present

## 2022-03-06 DIAGNOSIS — G35 Multiple sclerosis: Secondary | ICD-10-CM | POA: Diagnosis not present

## 2022-03-06 DIAGNOSIS — K219 Gastro-esophageal reflux disease without esophagitis: Secondary | ICD-10-CM | POA: Diagnosis not present

## 2022-03-06 DIAGNOSIS — G629 Polyneuropathy, unspecified: Secondary | ICD-10-CM | POA: Diagnosis not present

## 2022-03-07 ENCOUNTER — Emergency Department (HOSPITAL_COMMUNITY): Payer: Medicare Other

## 2022-03-07 ENCOUNTER — Emergency Department (HOSPITAL_COMMUNITY)
Admission: EM | Admit: 2022-03-07 | Discharge: 2022-03-08 | Disposition: A | Discharge status: Home / Self Care | Payer: Medicare Other | Attending: Emergency Medicine | Admitting: Emergency Medicine

## 2022-03-07 ENCOUNTER — Other Ambulatory Visit: Payer: Self-pay

## 2022-03-07 DIAGNOSIS — I4891 Unspecified atrial fibrillation: Secondary | ICD-10-CM | POA: Diagnosis not present

## 2022-03-07 DIAGNOSIS — N858 Other specified noninflammatory disorders of uterus: Secondary | ICD-10-CM | POA: Diagnosis not present

## 2022-03-07 DIAGNOSIS — R109 Unspecified abdominal pain: Secondary | ICD-10-CM | POA: Diagnosis not present

## 2022-03-07 DIAGNOSIS — I509 Heart failure, unspecified: Secondary | ICD-10-CM | POA: Insufficient documentation

## 2022-03-07 DIAGNOSIS — E871 Hypo-osmolality and hyponatremia: Secondary | ICD-10-CM | POA: Insufficient documentation

## 2022-03-07 DIAGNOSIS — N3000 Acute cystitis without hematuria: Secondary | ICD-10-CM | POA: Diagnosis not present

## 2022-03-07 DIAGNOSIS — N1330 Unspecified hydronephrosis: Secondary | ICD-10-CM | POA: Diagnosis not present

## 2022-03-07 DIAGNOSIS — N3289 Other specified disorders of bladder: Secondary | ICD-10-CM | POA: Diagnosis not present

## 2022-03-07 DIAGNOSIS — K5900 Constipation, unspecified: Secondary | ICD-10-CM | POA: Insufficient documentation

## 2022-03-07 DIAGNOSIS — E878 Other disorders of electrolyte and fluid balance, not elsewhere classified: Secondary | ICD-10-CM | POA: Diagnosis not present

## 2022-03-07 DIAGNOSIS — Z7901 Long term (current) use of anticoagulants: Secondary | ICD-10-CM | POA: Insufficient documentation

## 2022-03-07 DIAGNOSIS — N390 Urinary tract infection, site not specified: Secondary | ICD-10-CM | POA: Diagnosis not present

## 2022-03-07 DIAGNOSIS — N134 Hydroureter: Secondary | ICD-10-CM | POA: Diagnosis not present

## 2022-03-07 DIAGNOSIS — N8500 Endometrial hyperplasia, unspecified: Secondary | ICD-10-CM | POA: Diagnosis not present

## 2022-03-07 DIAGNOSIS — R0902 Hypoxemia: Secondary | ICD-10-CM | POA: Diagnosis not present

## 2022-03-07 DIAGNOSIS — D72829 Elevated white blood cell count, unspecified: Secondary | ICD-10-CM | POA: Insufficient documentation

## 2022-03-07 DIAGNOSIS — R1084 Generalized abdominal pain: Secondary | ICD-10-CM

## 2022-03-07 DIAGNOSIS — N281 Cyst of kidney, acquired: Secondary | ICD-10-CM | POA: Diagnosis not present

## 2022-03-07 LAB — COMPREHENSIVE METABOLIC PANEL
ALT: 12 U/L (ref 0–44)
AST: 16 U/L (ref 15–41)
Albumin: 3.5 g/dL (ref 3.5–5.0)
Alkaline Phosphatase: 74 U/L (ref 38–126)
Anion gap: 11 (ref 5–15)
BUN: 10 mg/dL (ref 8–23)
CO2: 31 mmol/L (ref 22–32)
Calcium: 9 mg/dL (ref 8.9–10.3)
Chloride: 88 mmol/L — ABNORMAL LOW (ref 98–111)
Creatinine, Ser: 0.83 mg/dL (ref 0.44–1.00)
GFR, Estimated: >60 mL/min (ref >60)
Glucose, Bld: 98 mg/dL (ref 70–99)
Potassium: 4.3 mmol/L (ref 3.5–5.1)
Sodium: 130 mmol/L — ABNORMAL LOW (ref 135–145)
Total Bilirubin: 0.6 mg/dL (ref 0.3–1.2)
Total Protein: 6.5 g/dL (ref 6.5–8.1)

## 2022-03-07 LAB — CBC WITH DIFFERENTIAL/PLATELET
Abs Immature Granulocytes: 0.02 10*3/uL (ref 0.00–0.07)
Basophils Absolute: 0 10*3/uL (ref 0.0–0.1)
Basophils Relative: 0 %
Eosinophils Absolute: 0.1 10*3/uL (ref 0.0–0.5)
Eosinophils Relative: 1 %
HCT: 41.2 % (ref 36.0–46.0)
Hemoglobin: 14.3 g/dL (ref 12.0–15.0)
Immature Granulocytes: 0 %
Lymphocytes Relative: 17 %
Lymphs Abs: 1.8 10*3/uL (ref 0.7–4.0)
MCH: 30.9 pg (ref 26.0–34.0)
MCHC: 34.7 g/dL (ref 30.0–36.0)
MCV: 89 fL (ref 80.0–100.0)
Monocytes Absolute: 1.7 10*3/uL — ABNORMAL HIGH (ref 0.1–1.0)
Monocytes Relative: 16 %
Neutro Abs: 7.2 10*3/uL (ref 1.7–7.7)
Neutrophils Relative %: 66 %
Platelets: 191 10*3/uL (ref 150–400)
RBC: 4.63 MIL/uL (ref 3.87–5.11)
RDW: 12.2 % (ref 11.5–15.5)
WBC: 10.9 10*3/uL — ABNORMAL HIGH (ref 4.0–10.5)
nRBC: 0 % (ref 0.0–0.2)

## 2022-03-07 LAB — I-STAT CHEM 8, ED
BUN: 11 mg/dL (ref 8–23)
Calcium, Ion: 1.04 mmol/L — ABNORMAL LOW (ref 1.15–1.40)
Chloride: 87 mmol/L — ABNORMAL LOW (ref 98–111)
Creatinine, Ser: 0.8 mg/dL (ref 0.44–1.00)
Glucose, Bld: 93 mg/dL (ref 70–99)
HCT: 43 % (ref 36.0–46.0)
Hemoglobin: 14.6 g/dL (ref 12.0–15.0)
Potassium: 4.4 mmol/L (ref 3.5–5.1)
Sodium: 128 mmol/L — ABNORMAL LOW (ref 135–145)
TCO2: 31 mmol/L (ref 22–32)

## 2022-03-07 LAB — I-STAT VENOUS BLOOD GAS, ED
Acid-Base Excess: 6 mmol/L — ABNORMAL HIGH (ref 0.0–2.0)
Bicarbonate: 32.1 mmol/L — ABNORMAL HIGH (ref 20.0–28.0)
Calcium, Ion: 1.04 mmol/L — ABNORMAL LOW (ref 1.15–1.40)
HCT: 35 % — ABNORMAL LOW (ref 36.0–46.0)
Hemoglobin: 11.9 g/dL — ABNORMAL LOW (ref 12.0–15.0)
O2 Saturation: 99 %
Potassium: 3.6 mmol/L (ref 3.5–5.1)
Sodium: 134 mmol/L — ABNORMAL LOW (ref 135–145)
TCO2: 34 mmol/L — ABNORMAL HIGH (ref 22–32)
pCO2, Ven: 50.2 mmHg (ref 44–60)
pH, Ven: 7.414 (ref 7.25–7.43)
pO2, Ven: 126 mmHg — ABNORMAL HIGH (ref 32–45)

## 2022-03-07 LAB — LIPASE, BLOOD: Lipase: 24 U/L (ref 11–51)

## 2022-03-07 LAB — PROTIME-INR
INR: 5.5 (ref 0.8–1.2)
Prothrombin Time: 49.7 seconds — ABNORMAL HIGH (ref 11.4–15.2)

## 2022-03-07 LAB — MAGNESIUM: Magnesium: 1.7 mg/dL (ref 1.7–2.4)

## 2022-03-07 MED ORDER — METOCLOPRAMIDE HCL 5 MG/ML IJ SOLN
10.0000 mg | Freq: Once | INTRAMUSCULAR | Status: AC
  Administered 2022-03-07: 10 mg via INTRAVENOUS
  Filled 2022-03-07: qty 2

## 2022-03-07 MED ORDER — LACTATED RINGERS IV BOLUS: 1000.0000 mL | Freq: Once | INTRAVENOUS | Status: DC

## 2022-03-07 MED ORDER — SODIUM CHLORIDE 0.9 % IV BOLUS
1000.0000 mL | Freq: Once | INTRAVENOUS | Status: AC
  Administered 2022-03-07: 1000 mL via INTRAVENOUS

## 2022-03-07 MED ORDER — IOHEXOL 350 MG/ML SOLN
80.0000 mL | Freq: Once | INTRAVENOUS | Status: AC | PRN
  Administered 2022-03-07: 80 mL via INTRAVENOUS

## 2022-03-07 NOTE — ED Notes (Signed)
Rt called to place pt on BiPap

## 2022-03-07 NOTE — ED Notes (Signed)
Pt wears BiPap at night. O2 saturations dropping to 86% while asleep. Placed on 3L [place]

## 2022-03-07 NOTE — ED Provider Notes (Incomplete)
[facility] DEPARTMENT Provider Note   CSN: [id number] Arrival date & time: 03/07/22  1904     History {Add pertinent medical, surgical, social history, OB history to HPI:1} Chief Complaint  Patient presents with   Abdominal Pain    Toni [name] is a 76 y.o. female.   Abdominal Pain Associated symptoms: constipation, fatigue and nausea   Patient presents for abdominal pain.  Medical history includes multiple sclerosis, chronic constipation, atrial fibrillation, OSA, HLD, neurogenic bladder, CHF, arthritis, anxiety, and depression.  She states that she is on a multiagent regimen for chronic constipation.  Over the past week, her constipation has worsened.  She has had an escalation in her bowel regimen.  She has been on clear liquids.  She has undergone multiple KUBs which did show some colonic distention.  She has had some small bowel movements that occur with enemas, most recent of which was yesterday.  She continues to have a generalized abdominal pain.  She states that she has chronic nausea but this has also worsened recently.  She has also experienced fatigue and generalized weakness.  She is typically able to assist with transfers but was unable to today.  She has a history of recurrent UTIs and is currently on a Macrobid prophylaxic dose.    Home Medications Prior to Admission medications   Medication Sig Start Date End Date Taking? Authorizing Provider  acetaminophen (TYLENOL) 500 MG tablet Take 1,000 mg by mouth every 6 (six) hours as needed (fever/headache/pain).    [provider]  ALPRAZolam (XANAX) 0.5 MG tablet TAKE 1 TABLET BY MOUTH 3  TIMES DAILY AS NEEDED FOR  ANXIETY AND SLEEP Patient taking differently: Take 0.5 mg by mouth 3 (three) times daily as needed for anxiety or sleep. 10/18/20   Dohmeier, Asencion Partridge, MD  ARTIFICIAL TEARS 0.2-0.2-1 % SOLN Apply to eye. 06/09/21   [provider]  Ascorbic Acid (VITAMIN C) 1000 MG  tablet Take 1,000 mg by mouth 4 (four) times daily.    [provider]  Biotin 10000 MCG TABS Take 10,000 mcg by mouth daily after lunch.    [provider]  buPROPion (WELLBUTRIN XL) 150 MG 24 hr tablet Take 1 tablet (150 mg total) by mouth daily. Patient taking differently: Take 150 mg by mouth daily. After breakfest 10/18/20   Dohmeier, Asencion Partridge, MD  Calcium Carbonate-Vitamin D3 600-400 MG-UNIT TABS Take 1 tablet by mouth in the morning and at bedtime. (After breakfast & after lunch)    [provider]  cetirizine (ZYRTEC) 10 MG tablet Take 10 mg by mouth daily as needed for allergies.     [provider]  Cholecalciferol (VITAMIN D3) 50 MCG (2000 UT) TABS Take 2,000 Units by mouth in the morning and at bedtime. (After breakfast & after lunch)    [provider]  Coenzyme Q10 (COQ10 PO) Take by mouth daily in the afternoon.    [provider]  corticotropin (ACTHAR) 80 UNIT/ML injectable gel inject 1 ml into the muscle every other day for 10 days each month 09/12/21   Dohmeier, Asencion Partridge, MD  diclofenac Sodium (VOLTAREN) 1 % GEL Apply 2 g topically 3 (three) times daily as needed. Patient taking differently: Apply 2 g topically 3 (three) times daily as needed (pain (applied to bilateral shoulders)). 12/08/20   Mcarthur Rossetti, MD  diltiazem (CARDIZEM CD) 240 MG 24 hr capsule Take 240 mg by mouth daily. 06/19/21   [provider]  docusate sodium (COLACE)  100 MG capsule Take 1 capsule (100 mg total) by mouth daily. Patient taking differently: Take 100 mg by mouth 2 (two) times daily. 08/12/16   Rai, Ripudeep K, MD  escitalopram (LEXAPRO) 10 MG tablet Take 1 tablet (10 mg total) by mouth daily. Patient taking differently: Take 10 mg by mouth at bedtime. 10/18/20   Dohmeier, Asencion Partridge, MD  famotidine (PEPCID) 20 MG tablet Take 20 mg by mouth daily.    [provider]  furosemide (LASIX) 20 MG tablet Take 2 tablets (40 mg total) by  mouth in the morning, at noon, and at bedtime. Patient taking differently: Take 60 mg by mouth 2 (two) times daily. 12/07/20   Hilty, Nadean Corwin, MD  gabapentin (NEURONTIN) 300 MG capsule Mrs. Larocque will take 300 mg of gabapentin at 6 AM, 10 AM, 2 PM and 6 PM and 600 mg at 10 PM by mouth. 03/07/21   Dohmeier, Asencion Partridge, MD  gabapentin (NEURONTIN) 600 MG tablet Take 600 mg by mouth at bedtime.    [provider]  lactobacillus acidophilus (BACID) TABS tablet Take 1 tablet by mouth 2 (two) times daily as needed.    [provider]  lactulose (CHRONULAC) 10 GM/15ML solution as needed. 10/22/21   [provider]  levothyroxine (SYNTHROID, LEVOTHROID) 75 MCG tablet Take 75 mcg by mouth daily before breakfast. (0500)    [provider]  melatonin 5 MG TABS Take 5 mg by mouth at bedtime.    [provider]  Menthol-Zinc Oxide (REMEDY CALAZIME) 0.4-20.5 % PSTE Apply 1 application topically 3 (three) times daily as needed (buttocks/sacrum with incontinences care episodes).    [provider]  metolazone (ZAROXOLYN) 2.5 MG tablet Take 2.5 mg by mouth daily. 09/09/21   [provider]  Multiple Vitamin (MULTIVITAMIN WITH MINERALS) TABS tablet Take 1 tablet by mouth daily after lunch.    [provider]  neomycin-bacitracin-polymyxin (NEOSPORIN) ointment Apply 1 application topically 2 (two) times daily as needed for wound care (applied to nose (wear mask rubs)).    [provider]  NON FORMULARY ASV breathing machine is to be used while napping & at bedtime. Fill canister up to max line with distilled water and turn concentrator to 5LPM.    [provider]  [facility] powder Apply topically. 06/09/21   [provider]  omeprazole (PRILOSEC) 40 MG capsule Take 1 capsule (40 mg total) by mouth every morning. 11/16/21   Levin Erp, PA  oxybutynin (DITROPAN) 5 MG tablet Take 5 mg by mouth 3 (three) times daily as needed  for bladder spasms. 02/09/20   [provider]  phenazopyridine (PYRIDIUM) 100 MG tablet Take 100 mg by mouth 3 (three) times daily. 08/15/21   [provider]  potassium chloride SA (KLOR-CON) 20 MEQ tablet Take 2 tablets (40 mEq total) by mouth daily. Patient taking differently: Take 20 mEq by mouth 2 (two) times daily. (After lunch & after dinner) 01/03/21   Hilty, Nadean Corwin, MD  prochlorperazine (COMPAZINE) 5 MG tablet Take 5 mg by mouth daily as needed for nausea or vomiting.    [provider]  Prucalopride Succinate (MOTEGRITY) 2 MG TABS Take 1 tablet (2 mg total) by mouth daily. 01/24/22   Thornton Park, MD  psyllium (METAMUCIL SMOOTH TEXTURE) 28 % packet Take 1 packet by mouth at bedtime.    [provider]  simvastatin (ZOCOR) 10 MG tablet Take 10 mg by mouth at bedtime. 03/01/18   [provider]  SYRINGE/NEEDLE,  DISP, 1 ML (BD LUER-LOK SYRINGE) 25G X 5/8" 1 ML MISC Use as directed to inject  Acthar 06/15/15   Dohmeier, Asencion Partridge, MD  warfarin (COUMADIN) 4 MG tablet Take 4 mg by mouth daily. 08/13/21   [provider]  zinc oxide 20 % ointment Apply 1 application topically as needed for irritation (apply labia every shift as needed for redness).    [provider]      Allergies    Other, Chlorhexidine, Hydrocodone, Oxycodone, Sertraline, Macrobid [nitrofurantoin], Phenol, and Rofecoxib    Review of Systems   Review of Systems  Constitutional:  Positive for fatigue.  Gastrointestinal:  Positive for abdominal pain, constipation and nausea.  Neurological:  Positive for weakness (Generalized).  All other systems reviewed and are negative.   Physical Exam Updated Vital Signs There were no vitals taken for this visit. Physical Exam Vitals and nursing note reviewed.  Constitutional:      General: She is not in acute distress.    Appearance: She is well-developed. She is ill-appearing (Chronically). She is not  toxic-appearing or diaphoretic.  HENT:     Head: Normocephalic and atraumatic.     Mouth/Throat:     Mouth: Mucous membranes are moist.     Pharynx: Oropharynx is clear.  Eyes:     General: No scleral icterus.    Extraocular Movements: Extraocular movements intact.     Conjunctiva/sclera: Conjunctivae normal.  Cardiovascular:     Rate and Rhythm: Normal rate and regular rhythm.     Heart sounds: No murmur heard. Pulmonary:     Effort: Pulmonary effort is normal. No respiratory distress.     Breath sounds: Normal breath sounds. No wheezing or rales.  Abdominal:     Palpations: Abdomen is soft.     Tenderness: There is generalized abdominal tenderness (Mild). There is no guarding or rebound.  Musculoskeletal:        General: No swelling.     Cervical back: Neck supple.  Skin:    General: Skin is warm and dry.     Coloration: Skin is not cyanotic, jaundiced or pale.  Neurological:     General: No focal deficit present.     Mental Status: She is alert and oriented to person, place, and time.  Psychiatric:        Mood and Affect: Mood normal.        Behavior: Behavior normal.     ED Results / Procedures / Treatments   Labs (all labs ordered are listed, but only abnormal results are displayed) Labs Reviewed - No data to display  EKG None  Radiology No results found.  Procedures Procedures  {Document cardiac monitor, telemetry assessment procedure when appropriate:1}  Medications Ordered in ED Medications - No data to display  ED Course/ Medical Decision Making/ A&P                           Medical Decision Making Amount and/or Complexity of Data Reviewed Labs: ordered. Radiology: ordered.  Risk Prescription drug management.   This patient presents to the ED for concern of abdominal pain, this involves an extensive number of treatment options, and is a complaint that carries with it a high risk of complications and morbidity.  The differential diagnosis  includes constipation, bowel obstruction, ileus, gastroparesis, enteritis, gastritis, cholecystitis, pancreatitis   Co morbidities that complicate the patient evaluation  multiple sclerosis, chronic constipation, atrial fibrillation, OSA, HLD, neurogenic bladder, CHF, arthritis, anxiety,  and depression   Additional history obtained:  Additional history obtained from EMS, patient's daughter External records from outside source obtained and reviewed including EMR   Lab Tests:  I Ordered, and personally interpreted labs.  The pertinent results include: Mild hyponatremia and hypochloremia with otherwise normal electrolytes, mild leukocytosis, supratherapeutic INR of 5.5.   Imaging Studies ordered:  I ordered imaging studies including CT of abdomen and pelvis I independently visualized and interpreted imaging which showed the following: Moderate bilateral hydronephrosis, thickened bladder wall, air within endometrium, no bowel obstruction. I agree with the radiologist interpretation   Cardiac Monitoring: / EKG:  The patient was maintained on a cardiac monitor.  I personally viewed and interpreted the cardiac monitored which showed an underlying rhythm of: Atrial fibrillation   Consultations Obtained:  I requested consultation with the ***,  and discussed lab and imaging findings as well as pertinent plan - they recommend: ***   Problem List / ED Course / Critical interventions / Medication management  Patient is a pleasant 76 year old female with history of MS and recurrent issues with bowel motility, presents for worsening abdominal pain over the past week in the setting of limited bowel movements.  Constipation has been refractory to increased bowel regimen at her nursing facility and clear liquid diet.  She has had small bowel movements with enemas, including yesterday.  Patient is alert and oriented on arrival.  He is mildly tachycardic with an irregularly irregular rhythm  consistent with history of A-fib.  Her abdomen is soft but she does endorse some mild generalized tenderness to deep palpation.  She does endorse current nausea.  Reglan was ordered for nausea.  Given her limited p.o. intake, bolus of IV fluids was ordered.  Laboratory work-up was initiated.  Laboratory work-up is notable for supratherapeutic INR of 5.5.  Patient denies any recent bleeding.  She has a slight leukocytosis and lab work is otherwise unremarkable.  She underwent a CT scan of abdomen pelvis which showed multiple findings.  Patient has bilateral hydronephrosis which appears to be new.  There is also thickening of bladder wall consistent with possible cystitis.  She does have history of recurrent UTIs and is on a prophylactic dose of Macrobid currently.  There is also air within the endometrium.  Recent procedures,***. I ordered medication including ***  for ***  Reevaluation of the patient after these medicines showed that the patient {resolved/improved/worsened:23923::"improved"} I have reviewed the patients home medicines and have made adjustments as needed   Social Determinants of Health:  ***   Test / Admission - Considered:  ***   {Document critical care time when appropriate:1} {Document review of labs and clinical decision tools ie heart score, Chads2Vasc2 etc:1}  {Document your independent review of radiology images, and any outside records:1} {Document your discussion with family members, caretakers, and with consultants:1} {Document social determinants of health affecting pt's care:1} {Document your decision making why or why not admission, treatments were needed:1} Final Clinical Impression(s) / ED Diagnoses Final diagnoses:  None    Rx / DC Orders ED Discharge Orders     None

## 2022-03-07 NOTE — ED Triage Notes (Signed)
Pt here for abd pain that started last Thursday. Pt went to dr and got x ray and showed blockages. Pt had x ray today and still showed blockages. Pt is having 6/10 -pain in LLQ. Pt on liquid diet. Pt had enema yesterday and had small bowel movement. VSS. Axox4.

## 2022-03-08 ENCOUNTER — Emergency Department (HOSPITAL_COMMUNITY): Payer: Medicare Other

## 2022-03-08 DIAGNOSIS — R457 State of emotional shock and stress, unspecified: Secondary | ICD-10-CM | POA: Diagnosis not present

## 2022-03-08 DIAGNOSIS — N8500 Endometrial hyperplasia, unspecified: Secondary | ICD-10-CM | POA: Diagnosis not present

## 2022-03-08 DIAGNOSIS — R1084 Generalized abdominal pain: Secondary | ICD-10-CM | POA: Diagnosis not present

## 2022-03-08 DIAGNOSIS — I4891 Unspecified atrial fibrillation: Secondary | ICD-10-CM | POA: Diagnosis not present

## 2022-03-08 DIAGNOSIS — G4733 Obstructive sleep apnea (adult) (pediatric): Secondary | ICD-10-CM | POA: Diagnosis not present

## 2022-03-08 DIAGNOSIS — H04123 Dry eye syndrome of bilateral lacrimal glands: Secondary | ICD-10-CM | POA: Diagnosis not present

## 2022-03-08 DIAGNOSIS — F32A Depression, unspecified: Secondary | ICD-10-CM | POA: Diagnosis not present

## 2022-03-08 DIAGNOSIS — N390 Urinary tract infection, site not specified: Secondary | ICD-10-CM | POA: Diagnosis not present

## 2022-03-08 DIAGNOSIS — Z7401 Bed confinement status: Secondary | ICD-10-CM | POA: Diagnosis not present

## 2022-03-08 DIAGNOSIS — G35 Multiple sclerosis: Secondary | ICD-10-CM | POA: Diagnosis not present

## 2022-03-08 DIAGNOSIS — N1330 Unspecified hydronephrosis: Secondary | ICD-10-CM | POA: Diagnosis not present

## 2022-03-08 DIAGNOSIS — E559 Vitamin D deficiency, unspecified: Secondary | ICD-10-CM | POA: Diagnosis not present

## 2022-03-08 DIAGNOSIS — R0902 Hypoxemia: Secondary | ICD-10-CM | POA: Diagnosis not present

## 2022-03-08 DIAGNOSIS — R110 Nausea: Secondary | ICD-10-CM | POA: Diagnosis not present

## 2022-03-08 DIAGNOSIS — E785 Hyperlipidemia, unspecified: Secondary | ICD-10-CM | POA: Diagnosis not present

## 2022-03-08 DIAGNOSIS — K5900 Constipation, unspecified: Secondary | ICD-10-CM | POA: Diagnosis not present

## 2022-03-08 DIAGNOSIS — N858 Other specified noninflammatory disorders of uterus: Secondary | ICD-10-CM | POA: Diagnosis not present

## 2022-03-08 LAB — URINALYSIS, ROUTINE W REFLEX MICROSCOPIC
Bacteria, UA: NONE SEEN
Bilirubin Urine: NEGATIVE
Glucose, UA: NEGATIVE mg/dL
Ketones, ur: NEGATIVE mg/dL
Nitrite: NEGATIVE
Protein, ur: 100 mg/dL — AB
Specific Gravity, Urine: 1.013 (ref 1.005–1.030)
WBC, UA: >50 WBC/hpf — ABNORMAL HIGH (ref 0–5)
pH: 7 (ref 5.0–8.0)

## 2022-03-08 LAB — BRAIN NATRIURETIC PEPTIDE: B Natriuretic Peptide: 81.1 pg/mL (ref 0.0–100.0)

## 2022-03-08 MED ORDER — FOSFOMYCIN TROMETHAMINE 3 G PO PACK
3.0000 g | PACK | Freq: Once | ORAL | Status: AC
  Administered 2022-03-08: 3 g via ORAL
  Filled 2022-03-08: qty 3

## 2022-03-08 MED ORDER — SODIUM CHLORIDE 0.9 % IV SOLN: 1.0000 g | Freq: Once | INTRAVENOUS | Status: DC

## 2022-03-08 MED ORDER — GABAPENTIN 300 MG PO CAPS
300.0000 mg | ORAL_CAPSULE | Freq: Once | ORAL | Status: AC
  Administered 2022-03-08: 300 mg via ORAL
  Filled 2022-03-08: qty 1

## 2022-03-08 NOTE — Discharge Instructions (Addendum)
You were seen today for abdominal discomfort.  Your CT scan does not show any evidence of obstruction or ileus.  You do have a moderate stool burden.  You likely have an increased risk for constipation given your MS and slow transit.  Follow-up closely with your GI doctor regarding further recommendations for bowel regimen.  You should be able to resume a normal diet.    You did have evidence of UTI.  You were treated with 1 dose of fosfomycin.  Urine cultures are pending.    Additionally, you had a small lesion on your left kidney that needs follow-up MRI.  This can be done as an outpatient.

## 2022-03-08 NOTE — ED Provider Notes (Addendum)
Patient signed out pending further work-up including urinalysis, chest x-ray, and abdominal ultrasound.  In brief, history of MS.  Frequent UTIs and issues with constipation and ileus.  Came in with abdominal pain.  CT scan has multiple incidental findings but no evidence of obstruction or ileus.  Bladder appears thickened and is concerning for possible UTI.  She also has a moderate stool burden.  She has an incidental renal lesion on the left kidney.  Question air in the endometrium.  Follow-up abdominal ultrasound was obtained.  This does not show any evidence of endometrial air.  Urinalysis does show large leukocyte Estrace and greater than 50 white cells.  Urine culture was sent.  She has had multiple prior urine cultures with multiple species grew out.  During her last admission she was given fosfomycin.  She was given a dose of fosfomycin here.  She is clinically without evidence of sepsis.  She is afebrile.  I had a long discussion with the patient and her daughter.  At 1 point she did require supplemental oxygen.  She has been weaned off to room air.  We discussed resuming a normal diet and following up with GI to adjust bowel regimen given her moderate stool burden.  She may benefit from a full cleanout; however, given her complicated history, would defer to GI for this.  Patient and daughter state understanding.  4:26 AM INR also noted to be 5.5.  Recommend patient hold Coumadin for the next 2 days and have repeated.  Physical Exam  BP (!) 96/56   Pulse 90   Temp 98.7 F (37.1 C) (Oral)   Resp 11   SpO2 97%   Physical Exam Elderly, chronically ill-appearing, no acute distress Abdomen soft Procedures  Procedures  ED Course / MDM    Medical Decision Making Amount and/or Complexity of Data Reviewed Labs: ordered. Radiology: ordered.  Risk Prescription drug management.   Problem List Items Addressed This Visit       Genitourinary   UTI (urinary tract infection)   Relevant  Medications   fosfomycin (MONUROL) packet 3 g   Other Visit Diagnoses     Generalized abdominal pain    -  Primary   Constipation, unspecified constipation type                 Merryl Hacker, MD 03/08/22 1103    Merryl Hacker, MD 03/08/22 [phone]

## 2022-03-08 NOTE — ED Notes (Signed)
[facility] called for pt transport to [facility]

## 2022-03-09 DIAGNOSIS — I4891 Unspecified atrial fibrillation: Secondary | ICD-10-CM | POA: Diagnosis not present

## 2022-03-09 LAB — URINE CULTURE

## 2022-03-10 ENCOUNTER — Telehealth: Payer: Self-pay

## 2022-03-10 NOTE — Telephone Encounter (Signed)
Spoke with caregiver of patient & scheduled follow up for 04/05/22 at 9:00 am with [place], [place]. Office number provided in case patient needs to reschedule.

## 2022-03-13 DIAGNOSIS — N390 Urinary tract infection, site not specified: Secondary | ICD-10-CM | POA: Diagnosis not present

## 2022-03-13 DIAGNOSIS — C649 Malignant neoplasm of unspecified kidney, except renal pelvis: Secondary | ICD-10-CM | POA: Diagnosis not present

## 2022-03-13 DIAGNOSIS — G629 Polyneuropathy, unspecified: Secondary | ICD-10-CM | POA: Diagnosis not present

## 2022-03-13 DIAGNOSIS — I4891 Unspecified atrial fibrillation: Secondary | ICD-10-CM | POA: Diagnosis not present

## 2022-03-13 DIAGNOSIS — M1990 Unspecified osteoarthritis, unspecified site: Secondary | ICD-10-CM | POA: Diagnosis not present

## 2022-03-13 DIAGNOSIS — N1330 Unspecified hydronephrosis: Secondary | ICD-10-CM | POA: Diagnosis not present

## 2022-03-13 DIAGNOSIS — E785 Hyperlipidemia, unspecified: Secondary | ICD-10-CM | POA: Diagnosis not present

## 2022-03-13 DIAGNOSIS — G35 Multiple sclerosis: Secondary | ICD-10-CM | POA: Diagnosis not present

## 2022-03-13 DIAGNOSIS — R110 Nausea: Secondary | ICD-10-CM | POA: Diagnosis not present

## 2022-03-13 DIAGNOSIS — G4700 Insomnia, unspecified: Secondary | ICD-10-CM | POA: Diagnosis not present

## 2022-03-13 DIAGNOSIS — E559 Vitamin D deficiency, unspecified: Secondary | ICD-10-CM | POA: Diagnosis not present

## 2022-03-14 ENCOUNTER — Ambulatory Visit: Payer: Medicare Other | Admitting: Internal Medicine

## 2022-03-14 DIAGNOSIS — R110 Nausea: Secondary | ICD-10-CM | POA: Diagnosis not present

## 2022-03-14 DIAGNOSIS — I509 Heart failure, unspecified: Secondary | ICD-10-CM | POA: Diagnosis not present

## 2022-03-14 DIAGNOSIS — K5900 Constipation, unspecified: Secondary | ICD-10-CM | POA: Diagnosis not present

## 2022-03-14 DIAGNOSIS — E785 Hyperlipidemia, unspecified: Secondary | ICD-10-CM | POA: Diagnosis not present

## 2022-03-14 DIAGNOSIS — I4891 Unspecified atrial fibrillation: Secondary | ICD-10-CM | POA: Diagnosis not present

## 2022-03-14 DIAGNOSIS — G35 Multiple sclerosis: Secondary | ICD-10-CM | POA: Diagnosis not present

## 2022-03-14 DIAGNOSIS — N1330 Unspecified hydronephrosis: Secondary | ICD-10-CM | POA: Diagnosis not present

## 2022-03-14 DIAGNOSIS — N390 Urinary tract infection, site not specified: Secondary | ICD-10-CM | POA: Diagnosis not present

## 2022-03-14 DIAGNOSIS — R509 Fever, unspecified: Secondary | ICD-10-CM | POA: Diagnosis not present

## 2022-03-14 DIAGNOSIS — G8929 Other chronic pain: Secondary | ICD-10-CM | POA: Diagnosis not present

## 2022-03-15 ENCOUNTER — Ambulatory Visit: Payer: Medicare Other | Admitting: Adult Health

## 2022-03-15 DIAGNOSIS — R10819 Abdominal tenderness, unspecified site: Secondary | ICD-10-CM | POA: Diagnosis not present

## 2022-03-15 DIAGNOSIS — K219 Gastro-esophageal reflux disease without esophagitis: Secondary | ICD-10-CM | POA: Diagnosis not present

## 2022-03-15 DIAGNOSIS — R34 Anuria and oliguria: Secondary | ICD-10-CM | POA: Diagnosis not present

## 2022-03-15 DIAGNOSIS — N1330 Unspecified hydronephrosis: Secondary | ICD-10-CM | POA: Diagnosis not present

## 2022-03-15 DIAGNOSIS — R509 Fever, unspecified: Secondary | ICD-10-CM | POA: Diagnosis not present

## 2022-03-15 DIAGNOSIS — G35 Multiple sclerosis: Secondary | ICD-10-CM | POA: Diagnosis not present

## 2022-03-15 DIAGNOSIS — I4891 Unspecified atrial fibrillation: Secondary | ICD-10-CM | POA: Diagnosis not present

## 2022-03-15 DIAGNOSIS — N390 Urinary tract infection, site not specified: Secondary | ICD-10-CM | POA: Diagnosis not present

## 2022-03-15 DIAGNOSIS — G4700 Insomnia, unspecified: Secondary | ICD-10-CM | POA: Diagnosis not present

## 2022-03-15 DIAGNOSIS — R1110 Vomiting, unspecified: Secondary | ICD-10-CM | POA: Diagnosis not present

## 2022-03-17 DIAGNOSIS — M6281 Muscle weakness (generalized): Secondary | ICD-10-CM | POA: Diagnosis not present

## 2022-03-17 DIAGNOSIS — Z741 Need for assistance with personal care: Secondary | ICD-10-CM | POA: Diagnosis not present

## 2022-03-18 DIAGNOSIS — Z7901 Long term (current) use of anticoagulants: Secondary | ICD-10-CM | POA: Diagnosis not present

## 2022-03-18 DIAGNOSIS — R791 Abnormal coagulation profile: Secondary | ICD-10-CM | POA: Diagnosis not present

## 2022-03-20 DIAGNOSIS — I4891 Unspecified atrial fibrillation: Secondary | ICD-10-CM | POA: Diagnosis not present

## 2022-03-20 DIAGNOSIS — Z741 Need for assistance with personal care: Secondary | ICD-10-CM | POA: Diagnosis not present

## 2022-03-20 DIAGNOSIS — M6281 Muscle weakness (generalized): Secondary | ICD-10-CM | POA: Diagnosis not present

## 2022-03-21 DIAGNOSIS — M6281 Muscle weakness (generalized): Secondary | ICD-10-CM | POA: Diagnosis not present

## 2022-03-21 DIAGNOSIS — Z741 Need for assistance with personal care: Secondary | ICD-10-CM | POA: Diagnosis not present

## 2022-03-22 DIAGNOSIS — R110 Nausea: Secondary | ICD-10-CM | POA: Diagnosis not present

## 2022-03-22 DIAGNOSIS — E039 Hypothyroidism, unspecified: Secondary | ICD-10-CM | POA: Diagnosis not present

## 2022-03-22 DIAGNOSIS — Z741 Need for assistance with personal care: Secondary | ICD-10-CM | POA: Diagnosis not present

## 2022-03-22 DIAGNOSIS — M25571 Pain in right ankle and joints of right foot: Secondary | ICD-10-CM | POA: Diagnosis not present

## 2022-03-22 DIAGNOSIS — K5900 Constipation, unspecified: Secondary | ICD-10-CM | POA: Diagnosis not present

## 2022-03-22 DIAGNOSIS — H04123 Dry eye syndrome of bilateral lacrimal glands: Secondary | ICD-10-CM | POA: Diagnosis not present

## 2022-03-22 DIAGNOSIS — Z7901 Long term (current) use of anticoagulants: Secondary | ICD-10-CM | POA: Diagnosis not present

## 2022-03-22 DIAGNOSIS — N390 Urinary tract infection, site not specified: Secondary | ICD-10-CM | POA: Diagnosis not present

## 2022-03-22 DIAGNOSIS — G35 Multiple sclerosis: Secondary | ICD-10-CM | POA: Diagnosis not present

## 2022-03-22 DIAGNOSIS — N1330 Unspecified hydronephrosis: Secondary | ICD-10-CM | POA: Diagnosis not present

## 2022-03-22 DIAGNOSIS — R5381 Other malaise: Secondary | ICD-10-CM | POA: Diagnosis not present

## 2022-03-22 DIAGNOSIS — C649 Malignant neoplasm of unspecified kidney, except renal pelvis: Secondary | ICD-10-CM | POA: Diagnosis not present

## 2022-03-22 DIAGNOSIS — M6281 Muscle weakness (generalized): Secondary | ICD-10-CM | POA: Diagnosis not present

## 2022-03-22 DIAGNOSIS — I4891 Unspecified atrial fibrillation: Secondary | ICD-10-CM | POA: Diagnosis not present

## 2022-03-22 DIAGNOSIS — M79671 Pain in right foot: Secondary | ICD-10-CM | POA: Diagnosis not present

## 2022-03-23 ENCOUNTER — Ambulatory Visit (INDEPENDENT_AMBULATORY_CARE_PROVIDER_SITE_OTHER): Payer: Medicare Other | Admitting: Adult Health

## 2022-03-23 ENCOUNTER — Encounter: Payer: Self-pay | Admitting: Adult Health

## 2022-03-23 ENCOUNTER — Ambulatory Visit: Payer: Medicare Other | Admitting: Adult Health

## 2022-03-23 VITALS — BP 105/49 | HR 75

## 2022-03-23 DIAGNOSIS — R252 Cramp and spasm: Secondary | ICD-10-CM | POA: Diagnosis not present

## 2022-03-23 DIAGNOSIS — Z9989 Dependence on other enabling machines and devices: Secondary | ICD-10-CM

## 2022-03-23 DIAGNOSIS — G4733 Obstructive sleep apnea (adult) (pediatric): Secondary | ICD-10-CM | POA: Diagnosis not present

## 2022-03-23 DIAGNOSIS — M6281 Muscle weakness (generalized): Secondary | ICD-10-CM | POA: Diagnosis not present

## 2022-03-23 DIAGNOSIS — G35 Multiple sclerosis: Secondary | ICD-10-CM

## 2022-03-23 DIAGNOSIS — Z741 Need for assistance with personal care: Secondary | ICD-10-CM | POA: Diagnosis not present

## 2022-03-23 NOTE — Progress Notes (Signed)
PATIENT: Toni [name] DOB: Jun 20, 1946  REASON FOR VISIT: follow up HISTORY FROM: patient PRIMARY NEUROLOGIST: Dr. Brett Fairy  Chief Complaint  Patient presents with   Follow-up    Pt in 32  Pt here for MS follow up Pt states she has questions about her incontinence  bladder and constipation  and restarting Acthar injections, and discuss Gabapentin Pt states needed to see if her settings need to be changed on CPAP machine . Pt wants to discuss her # of [place] nightly      HISTORY OF PRESENT ILLNESS: Today 03/23/22: Toni [name] is a 76 year old female with a history of second progressive MS and OSA on CPAP.   MS: takes gabapentin 300 mg four times a day and 600 mg at bedtime. Still has burning in the legs but its managable. She is questioning whether she needs to reduce her nightitme dose of gabapentin. Reports more weakness in right leg. Working with PT. Reports having several UTIs. Have completed retention test and seeing urology. Will be discussing foley catheter. Reports that she does have constipation. No changes with vision. Baclofen pump has helped with spasms. Reports that she wants to start back on Acthar but she stopped due to copay. Reports that she feels stronger and less fatigues since she used Acthar.   OSA on CPAP: uses O2 at bedtime 5L. Reports that it works     REVIEW OF SYSTEMS: Out of a complete 14 system review of symptoms, the patient complains only of the following symptoms, and all other reviewed systems are negative.  ALLERGIES: Allergies  Allergen Reactions   Other Rash    Topical Surgical prep  - severe rash   Chlorhexidine Hives and Other (See Comments)    And, sores appear where applied   Hydrocodone Nausea And Vomiting and Other (See Comments)    Hydrocodone causes vomiting Hydrocodone causes vomiting   Oxycodone Nausea And Vomiting and Other (See Comments)    Oral oxycodone causes vomiting   Sertraline Hives, Swelling, Rash and Other (See Comments)     [DERM][OTHER]RASH WITH SWELLING   Macrobid [Nitrofurantoin]     Other reaction(s): 3.9.17 nausea and vomitting,   Phenol Hives   Rofecoxib     Other reaction(s): Unknown    HOME MEDICATIONS: Outpatient Medications Prior to Visit  Medication Sig Dispense Refill   acetaminophen (TYLENOL) 500 MG tablet Take 1,000 mg by mouth every 6 (six) hours as needed (fever/headache/pain).     ALPRAZolam (XANAX) 0.5 MG tablet TAKE 1 TABLET BY MOUTH 3  TIMES DAILY AS NEEDED FOR  ANXIETY AND SLEEP (Patient taking differently: Take 0.5 mg by mouth 3 (three) times daily as needed for anxiety or sleep.) 90 tablet 1   ARTIFICIAL TEARS 0.2-0.2-1 % SOLN Apply to eye.     Ascorbic Acid (VITAMIN C) 1000 MG tablet Take 1,000 mg by mouth 4 (four) times daily.     Biotin 10000 MCG TABS Take 10,000 mcg by mouth daily after lunch.     buPROPion (WELLBUTRIN XL) 150 MG 24 hr tablet Take 1 tablet (150 mg total) by mouth daily. (Patient taking differently: Take 150 mg by mouth daily. After breakfest) 90 tablet 1   Calcium Carbonate-Vitamin D3 600-400 MG-UNIT TABS Take 1 tablet by mouth in the morning and at bedtime. (After breakfast & after lunch)     cefTAZidime (FORTAZ IV) Inject into the vein.     cetirizine (ZYRTEC) 10 MG tablet Take 10 mg by mouth daily as needed  for allergies.      Cholecalciferol (VITAMIN D3) 50 MCG (2000 UT) TABS Take 2,000 Units by mouth in the morning and at bedtime. (After breakfast & after lunch)     Coenzyme Q10 (COQ10 PO) Take by mouth daily in the afternoon.     diclofenac Sodium (VOLTAREN) 1 % GEL Apply 2 g topically 3 (three) times daily as needed. (Patient taking differently: Apply 2 g topically 3 (three) times daily as needed (pain (applied to bilateral shoulders)).) 100 g 3   diltiazem (CARDIZEM CD) 240 MG 24 hr capsule Take 240 mg by mouth daily.     docusate sodium (COLACE) 100 MG capsule Take 1 capsule (100 mg total) by mouth daily. (Patient taking differently: Take 100 mg by mouth 2  (two) times daily.) 10 capsule 0   escitalopram (LEXAPRO) 10 MG tablet Take 1 tablet (10 mg total) by mouth daily. (Patient taking differently: Take 10 mg by mouth at bedtime.) 90 tablet 1   famotidine (PEPCID) 20 MG tablet Take 20 mg by mouth daily.     furosemide (LASIX) 20 MG tablet Take 2 tablets (40 mg total) by mouth in the morning, at noon, and at bedtime. (Patient taking differently: Take 60 mg by mouth 2 (two) times daily.) 30 tablet 0   gabapentin (NEURONTIN) 300 MG capsule Toni [name] will take 300 mg of gabapentin at 6 AM, 10 AM, 2 PM and 6 PM and 600 mg at 10 PM by mouth. 180 capsule 11   gabapentin (NEURONTIN) 600 MG tablet Take 600 mg by mouth at bedtime.     lactobacillus acidophilus (BACID) TABS tablet Take 1 tablet by mouth 2 (two) times daily as needed.     lactulose (CHRONULAC) 10 GM/15ML solution as needed.     levothyroxine (SYNTHROID, LEVOTHROID) 75 MCG tablet Take 75 mcg by mouth daily before breakfast. (0500)     melatonin 5 MG TABS Take 5 mg by mouth at bedtime.     Menthol-Zinc Oxide (REMEDY CALAZIME) 0.4-20.5 % PSTE Apply 1 application topically 3 (three) times daily as needed (buttocks/sacrum with incontinences care episodes).     metolazone (ZAROXOLYN) 2.5 MG tablet Take 2.5 mg by mouth daily.     Multiple Vitamin (MULTIVITAMIN WITH MINERALS) TABS tablet Take 1 tablet by mouth daily after lunch.     neomycin-bacitracin-polymyxin (NEOSPORIN) ointment Apply 1 application topically 2 (two) times daily as needed for wound care (applied to nose (wear mask rubs)).     NON FORMULARY ASV breathing machine is to be used while napping & at bedtime. Fill canister up to max line with distilled water and turn concentrator to 5LPM.     NYAMYC powder Apply topically.     omeprazole (PRILOSEC) 40 MG capsule Take 1 capsule (40 mg total) by mouth every morning. 30 capsule 11   oxybutynin (DITROPAN) 5 MG tablet Take 5 mg by mouth 3 (three) times daily as needed for bladder spasms.      phenazopyridine (PYRIDIUM) 100 MG tablet Take 100 mg by mouth 3 (three) times daily.     potassium chloride SA (KLOR-CON) 20 MEQ tablet Take 2 tablets (40 mEq total) by mouth daily. (Patient taking differently: Take 20 mEq by mouth 2 (two) times daily. (After lunch & after dinner)) 180 tablet 3   prochlorperazine (COMPAZINE) 5 MG tablet Take 5 mg by mouth daily as needed for nausea or vomiting.     Prucalopride Succinate (MOTEGRITY) 2 MG TABS Take 1 tablet (2 mg total) by mouth  daily. 90 tablet 3   psyllium (METAMUCIL SMOOTH TEXTURE) 28 % packet Take 1 packet by mouth at bedtime.     simvastatin (ZOCOR) 10 MG tablet Take 10 mg by mouth at bedtime.     SYRINGE/NEEDLE, DISP, 1 ML (BD LUER-LOK SYRINGE) 25G X 5/8" 1 ML MISC Use as directed to inject  Acthar 10 each prn   warfarin (COUMADIN) 4 MG tablet Take 4 mg by mouth daily.     zinc oxide 20 % ointment Apply 1 application topically as needed for irritation (apply labia every shift as needed for redness).     corticotropin (ACTHAR) 80 UNIT/ML injectable gel inject 1 ml into the muscle every other day for 10 days each month (Patient not taking: Reported on 03/23/2022) 5 mL 5   No facility-administered medications prior to visit.    PAST MEDICAL HISTORY: Past Medical History:  Diagnosis Date   Abnormality of gait 03/01/2015   Anxiety    Arthritis    "knees" (01/19/2016)   Asthma    as a child    Back pain    Bilateral carpal tunnel syndrome 10/04/2020   Cancer ([place])    / RENAL CELL CARCINOMA - GETS CT SCAN EVERY 6 MONTHS TO WATCH    CHF (congestive heart failure) (HCC)    Chronic pain    "nerve pain from the MS"   Complex atypical endometrial hyperplasia    Constipation    Depression    DVT (deep venous thrombosis) ([place]) 1983   "RLE; may have just been phlebitis; it was before the age of dopplers"   Dysrhythmia    afib    Edema    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' ablation of R GSV 2012   GERD (gastroesophageal  reflux disease)    HLD (hyperlipidemia)    Hypertension    Hypotension    Hypothyroidism    Joint pain    Multiple sclerosis ([place])    has had this may 1989-[place] reg doc   OSA on CPAP    bipap machine - SLEEP APNEA WORSE SINCE COVID IN 1/21 PER PATIENT SETTING HAS INCREASED FROM 9 TO 30    Osteoarthritis    PAF (paroxysmal atrial fibrillation) ([place])    on coumadin; documented on monitor 06/2010 - no longer takes per [facility] 02/09/21   Pneumonia 11/2011   Sleep apnea    uses sleep apnea machine    PAST SURGICAL HISTORY: Past Surgical History:  Procedure Laterality Date   ANTERIOR CERVICAL DECOMP/DISCECTOMY FUSION  01/2004   CARDIAC CATHETERIZATION  06/22/2010   normal coronary arteries, PAF   CATARACT EXTRACTION, BILATERAL Bilateral    CHILD BIRTHS     X2   DILATATION & CURETTAGE/HYSTEROSCOPY WITH MYOSURE N/A 01/15/2020   Procedure: DILATATION & CURETTAGE/HYSTEROSCOPY WITH MYOSURE;  Surgeon: Arvella Nigh, MD;  Location: [facility];  Service: Gynecology;  Laterality: N/A;  pt in wheelchair-has MS   DILATION AND CURETTAGE OF UTERUS     DILATION AND CURETTAGE OF UTERUS N/A 02/16/2020   Procedure: DILATATION AND CURETTAGE;  Surgeon: Lafonda Mosses, MD;  Location: [facility];  Service: Gynecology;  Laterality: N/A;  NOT GENERAL -NO INTUBATION   DNC     INFUSION PUMP IMPLANTATION  ~ 2009   baclofen infusion in lower abd   INTRATHECAL PUMP REVISION N/A 02/18/2021   Procedure: Baclofen Pump replacement with possible revision of catheter;  Surgeon: Erline Levine, MD;  Location: [place];  Service: Neurosurgery;  Laterality: N/A;  3C/RM 20   INTRAUTERINE DEVICE (IUD) INSERTION N/A 02/16/2020   Procedure: INTRAUTERINE DEVICE (IUD) INSERTION - MIRENA;  Surgeon: Lafonda Mosses, MD;  Location: [facility];  Service: Gynecology;  Laterality: N/A;   IUD put in uterus     PAIN PUMP REVISION N/A 07/29/2014   Procedure: Baclofen pump replacement;  Surgeon: Erline Levine, MD;  Location: [place];  Service: Neurosurgery;  Laterality: N/A;  Baclofen pump replacement   TONSILLECTOMY AND ADENOIDECTOMY     VARICOSE VEIN SURGERY  ~ 1968   VENOUS ABLATION  12/16/2010   radiofreq ablation -Dr Elisabeth Cara and [facility]   WISDOM TOOTH EXTRACTION      FAMILY HISTORY: Family History  Problem Relation Age of Onset   Depression Mother    Sudden death Mother    Anxiety disorder Mother    Coronary artery disease Father        at age 43   Hyperlipidemia Father    Thyroid disease Father    Coronary artery disease Maternal Grandmother    Depression Maternal Grandmother    Cancer Paternal Grandmother        breast   Alcoholism Son    Sleep apnea Neg Hx    Multiple sclerosis Neg Hx     SOCIAL HISTORY: Social History   Socioeconomic History   Marital status: Married    Spouse name: [name]   Number of children: 2   Years of education: COLLEGE   Highest education level: Not on file  Occupational History   Occupation: RETIRED  Tobacco Use   Smoking status: Former    Packs/day: 0.50    Years: 7.00    Total pack years: 3.50    Types: Cigarettes    Quit date: 08/21/1976    Years since quitting: 45.6   Smokeless tobacco: Never  Vaping Use   Vaping Use: Never used  Substance and Sexual Activity   Alcohol use: Not Currently   Drug use: No   Sexual activity: Not Currently    Birth control/protection: Post-menopausal  Other Topics Concern   Not on file  Social History Narrative   Lives in [place] with Husband [name] [phone], [phone]   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   Right handed   Drinks a little caffeine in coffee sometimes    Social Determinants of Health   Financial Resource Strain: Not on file  Food Insecurity: Not on file  Transportation Needs: Unmet Transportation Needs (01/17/2022)   PRAPARE - Hydrologist (Medical): Yes    Lack of Transportation (Non-Medical): Yes  Physical Activity: Not on file  Stress: Not on file  Social Connections: Not on file   Intimate Partner Violence: Not on file      PHYSICAL EXAM  Vitals:   03/23/22 1309  BP: (!) 105/49  Pulse: 75     Generalized: Well developed, in no acute distress   Neurological examination  Mentation: Alert oriented to time, place, history taking. Follows all commands speech and language fluent Cranial nerve II-XII: Pupils were equal round reactive to light. Extraocular movements were full, visual field were full on confrontational test. Facial sensation and strength were normal.  Head turning and shoulder shrug  were normal and symmetric. Motor: The motor testing reveals 5 over 5 strength in upper extremities. 3/5 in the RLE. 4/5 in LLE. Sensory: Sensory testing is intact to soft touch on all 4 extremities. No evidence of extinction is noted.  Coordination: Cerebellar testing reveals  good finger-nose-finger unable to do heel to shin.  Gait and station: in a wheelchair    DIAGNOSTIC DATA (LABS, IMAGING, TESTING) - I reviewed patient records, labs, notes, testing and imaging myself where available.  Lab Results  Component Value Date   WBC 10.9 (H) 03/07/2022   HGB 11.9 (L) 03/07/2022   HCT 35.0 (L) 03/07/2022   MCV 89.0 03/07/2022   PLT 191 03/07/2022      Component Value Date/Time   NA 134 (L) 03/07/2022 2259   NA 144 12/15/2020 1100   K 3.6 03/07/2022 2259   CL 87 (L) 03/07/2022 2014   CO2 31 03/07/2022 1957   GLUCOSE 93 03/07/2022 2014   BUN 11 03/07/2022 2014   BUN 27 12/15/2020 1100   CREATININE 0.80 03/07/2022 2014   CALCIUM 9.0 03/07/2022 1957   PROT 6.5 03/07/2022 1957   PROT 5.9 (L) 09/20/2020 1436   ALBUMIN 3.5 03/07/2022 1957   ALBUMIN 3.9 09/20/2020 1436   AST 16 03/07/2022 1957   ALT 12 03/07/2022 1957   ALKPHOS 74 03/07/2022 1957   BILITOT 0.6 03/07/2022 1957   BILITOT 0.3 09/20/2020 1436   GFRNONAA >60 03/07/2022 1957   GFRAA 90 09/20/2020 1436   Lab Results  Component Value Date   CHOL 111 03/19/2019   HDL 57 03/19/2019   LDLCALC 43  03/19/2019   TRIG 53 03/19/2019   CHOLHDL 3.0 04/22/2010   Lab Results  Component Value Date   HGBA1C 4.9 06/05/2020   No results found for: "VITAMINB12" Lab Results  Component Value Date   TSH 2.961 01/03/2021      ASSESSMENT AND PLAN 76 y.o. year old female  has a past medical history of Abnormality of gait (03/01/2015), Anxiety, Arthritis, Asthma, Back pain, Bilateral carpal tunnel syndrome (10/04/2020), Cancer ([place]), CHF (congestive heart failure) ([place]), Chronic pain, Complex atypical endometrial hyperplasia, Constipation, Depression, DVT (deep venous thrombosis) (H[facility]) (1983), Dysrhythmia, Edema, GERD (gastroesophageal reflux disease), HLD (hyperlipidemia), Hypertension, Hypotension, Hypothyroidism, Joint pain, Multiple sclerosis ([place]), OSA on CPAP, Osteoarthritis, PAF (paroxysmal atrial fibrillation) ([place]), Pneumonia (11/2011), and Sleep apnea. here with:   Multiple Sclerosis  Will try reducing nighttime dose of gabapentin to 300 mg. Continue gabapentin 300 mg QID. Patient request to restart Acthar- I will discuss with Dr. Brett Fairy Continues on Xanax 0.5 TID PRN  2. OSA on CPAP  Good Compliance Good treatment of Apnea Continue using CPAP nightly and >4 hours each night.   FU in 6 months  Ward Givens, MSN, NP-C 03/23/2022, 1:15 PM [facility] [address], [place], [place] [address] [phone]

## 2022-03-24 DIAGNOSIS — M6281 Muscle weakness (generalized): Secondary | ICD-10-CM | POA: Diagnosis not present

## 2022-03-24 DIAGNOSIS — Z741 Need for assistance with personal care: Secondary | ICD-10-CM | POA: Diagnosis not present

## 2022-03-24 DIAGNOSIS — I4891 Unspecified atrial fibrillation: Secondary | ICD-10-CM | POA: Diagnosis not present

## 2022-03-27 DIAGNOSIS — I4891 Unspecified atrial fibrillation: Secondary | ICD-10-CM | POA: Diagnosis not present

## 2022-03-27 DIAGNOSIS — Z741 Need for assistance with personal care: Secondary | ICD-10-CM | POA: Diagnosis not present

## 2022-03-27 DIAGNOSIS — M6281 Muscle weakness (generalized): Secondary | ICD-10-CM | POA: Diagnosis not present

## 2022-03-28 DIAGNOSIS — Z741 Need for assistance with personal care: Secondary | ICD-10-CM | POA: Diagnosis not present

## 2022-03-28 DIAGNOSIS — M6281 Muscle weakness (generalized): Secondary | ICD-10-CM | POA: Diagnosis not present

## 2022-03-29 ENCOUNTER — Encounter (INDEPENDENT_AMBULATORY_CARE_PROVIDER_SITE_OTHER): Payer: Self-pay

## 2022-03-29 DIAGNOSIS — G8929 Other chronic pain: Secondary | ICD-10-CM | POA: Diagnosis not present

## 2022-03-29 DIAGNOSIS — Z741 Need for assistance with personal care: Secondary | ICD-10-CM | POA: Diagnosis not present

## 2022-03-29 DIAGNOSIS — F411 Generalized anxiety disorder: Secondary | ICD-10-CM | POA: Diagnosis not present

## 2022-03-29 DIAGNOSIS — M6281 Muscle weakness (generalized): Secondary | ICD-10-CM | POA: Diagnosis not present

## 2022-03-29 DIAGNOSIS — F329 Major depressive disorder, single episode, unspecified: Secondary | ICD-10-CM | POA: Diagnosis not present

## 2022-03-29 DIAGNOSIS — G4700 Insomnia, unspecified: Secondary | ICD-10-CM | POA: Diagnosis not present

## 2022-03-30 DIAGNOSIS — I4891 Unspecified atrial fibrillation: Secondary | ICD-10-CM | POA: Diagnosis not present

## 2022-03-30 DIAGNOSIS — Z741 Need for assistance with personal care: Secondary | ICD-10-CM | POA: Diagnosis not present

## 2022-03-30 DIAGNOSIS — Z7901 Long term (current) use of anticoagulants: Secondary | ICD-10-CM | POA: Diagnosis not present

## 2022-03-30 DIAGNOSIS — M6281 Muscle weakness (generalized): Secondary | ICD-10-CM | POA: Diagnosis not present

## 2022-03-31 DIAGNOSIS — Z741 Need for assistance with personal care: Secondary | ICD-10-CM | POA: Diagnosis not present

## 2022-03-31 DIAGNOSIS — M6281 Muscle weakness (generalized): Secondary | ICD-10-CM | POA: Diagnosis not present

## 2022-04-03 DIAGNOSIS — M6281 Muscle weakness (generalized): Secondary | ICD-10-CM | POA: Diagnosis not present

## 2022-04-03 DIAGNOSIS — Z741 Need for assistance with personal care: Secondary | ICD-10-CM | POA: Diagnosis not present

## 2022-04-04 DIAGNOSIS — Z741 Need for assistance with personal care: Secondary | ICD-10-CM | POA: Diagnosis not present

## 2022-04-04 DIAGNOSIS — M6281 Muscle weakness (generalized): Secondary | ICD-10-CM | POA: Diagnosis not present

## 2022-04-05 ENCOUNTER — Ambulatory Visit: Payer: Medicare Other | Admitting: Physician Assistant

## 2022-04-05 DIAGNOSIS — Z741 Need for assistance with personal care: Secondary | ICD-10-CM | POA: Diagnosis not present

## 2022-04-05 DIAGNOSIS — M6281 Muscle weakness (generalized): Secondary | ICD-10-CM | POA: Diagnosis not present

## 2022-04-06 DIAGNOSIS — Z741 Need for assistance with personal care: Secondary | ICD-10-CM | POA: Diagnosis not present

## 2022-04-06 DIAGNOSIS — Z7901 Long term (current) use of anticoagulants: Secondary | ICD-10-CM | POA: Diagnosis not present

## 2022-04-06 DIAGNOSIS — M6281 Muscle weakness (generalized): Secondary | ICD-10-CM | POA: Diagnosis not present

## 2022-04-07 DIAGNOSIS — Z741 Need for assistance with personal care: Secondary | ICD-10-CM | POA: Diagnosis not present

## 2022-04-07 DIAGNOSIS — Z5181 Encounter for therapeutic drug level monitoring: Secondary | ICD-10-CM | POA: Diagnosis not present

## 2022-04-07 DIAGNOSIS — M6281 Muscle weakness (generalized): Secondary | ICD-10-CM | POA: Diagnosis not present

## 2022-04-07 DIAGNOSIS — N130 Hydronephrosis with ureteropelvic junction obstruction: Secondary | ICD-10-CM | POA: Diagnosis not present

## 2022-04-07 DIAGNOSIS — Z7901 Long term (current) use of anticoagulants: Secondary | ICD-10-CM | POA: Diagnosis not present

## 2022-04-08 DIAGNOSIS — I5032 Chronic diastolic (congestive) heart failure: Secondary | ICD-10-CM | POA: Diagnosis not present

## 2022-04-08 DIAGNOSIS — Z7901 Long term (current) use of anticoagulants: Secondary | ICD-10-CM | POA: Diagnosis not present

## 2022-04-08 DIAGNOSIS — R791 Abnormal coagulation profile: Secondary | ICD-10-CM | POA: Diagnosis not present

## 2022-04-10 DIAGNOSIS — R5381 Other malaise: Secondary | ICD-10-CM | POA: Diagnosis not present

## 2022-04-10 DIAGNOSIS — K219 Gastro-esophageal reflux disease without esophagitis: Secondary | ICD-10-CM | POA: Diagnosis not present

## 2022-04-10 DIAGNOSIS — G4700 Insomnia, unspecified: Secondary | ICD-10-CM | POA: Diagnosis not present

## 2022-04-10 DIAGNOSIS — E785 Hyperlipidemia, unspecified: Secondary | ICD-10-CM | POA: Diagnosis not present

## 2022-04-10 DIAGNOSIS — C649 Malignant neoplasm of unspecified kidney, except renal pelvis: Secondary | ICD-10-CM | POA: Diagnosis not present

## 2022-04-10 DIAGNOSIS — M6281 Muscle weakness (generalized): Secondary | ICD-10-CM | POA: Diagnosis not present

## 2022-04-10 DIAGNOSIS — I509 Heart failure, unspecified: Secondary | ICD-10-CM | POA: Diagnosis not present

## 2022-04-10 DIAGNOSIS — G35 Multiple sclerosis: Secondary | ICD-10-CM | POA: Diagnosis not present

## 2022-04-10 DIAGNOSIS — I5032 Chronic diastolic (congestive) heart failure: Secondary | ICD-10-CM | POA: Diagnosis not present

## 2022-04-10 DIAGNOSIS — R110 Nausea: Secondary | ICD-10-CM | POA: Diagnosis not present

## 2022-04-10 DIAGNOSIS — Z741 Need for assistance with personal care: Secondary | ICD-10-CM | POA: Diagnosis not present

## 2022-04-10 DIAGNOSIS — I4891 Unspecified atrial fibrillation: Secondary | ICD-10-CM | POA: Diagnosis not present

## 2022-04-10 DIAGNOSIS — E559 Vitamin D deficiency, unspecified: Secondary | ICD-10-CM | POA: Diagnosis not present

## 2022-04-11 DIAGNOSIS — Z7901 Long term (current) use of anticoagulants: Secondary | ICD-10-CM | POA: Diagnosis not present

## 2022-04-11 DIAGNOSIS — M6281 Muscle weakness (generalized): Secondary | ICD-10-CM | POA: Diagnosis not present

## 2022-04-11 DIAGNOSIS — Z741 Need for assistance with personal care: Secondary | ICD-10-CM | POA: Diagnosis not present

## 2022-04-12 DIAGNOSIS — M1990 Unspecified osteoarthritis, unspecified site: Secondary | ICD-10-CM | POA: Diagnosis not present

## 2022-04-12 DIAGNOSIS — E039 Hypothyroidism, unspecified: Secondary | ICD-10-CM | POA: Diagnosis not present

## 2022-04-12 DIAGNOSIS — M6281 Muscle weakness (generalized): Secondary | ICD-10-CM | POA: Diagnosis not present

## 2022-04-12 DIAGNOSIS — C649 Malignant neoplasm of unspecified kidney, except renal pelvis: Secondary | ICD-10-CM | POA: Diagnosis not present

## 2022-04-12 DIAGNOSIS — Z7901 Long term (current) use of anticoagulants: Secondary | ICD-10-CM | POA: Diagnosis not present

## 2022-04-12 DIAGNOSIS — Z741 Need for assistance with personal care: Secondary | ICD-10-CM | POA: Diagnosis not present

## 2022-04-12 DIAGNOSIS — I4891 Unspecified atrial fibrillation: Secondary | ICD-10-CM | POA: Diagnosis not present

## 2022-04-12 DIAGNOSIS — I509 Heart failure, unspecified: Secondary | ICD-10-CM | POA: Diagnosis not present

## 2022-04-12 DIAGNOSIS — E559 Vitamin D deficiency, unspecified: Secondary | ICD-10-CM | POA: Diagnosis not present

## 2022-04-12 DIAGNOSIS — E785 Hyperlipidemia, unspecified: Secondary | ICD-10-CM | POA: Diagnosis not present

## 2022-04-13 DIAGNOSIS — Z741 Need for assistance with personal care: Secondary | ICD-10-CM | POA: Diagnosis not present

## 2022-04-13 DIAGNOSIS — R110 Nausea: Secondary | ICD-10-CM | POA: Diagnosis not present

## 2022-04-13 DIAGNOSIS — M1990 Unspecified osteoarthritis, unspecified site: Secondary | ICD-10-CM | POA: Diagnosis not present

## 2022-04-13 DIAGNOSIS — M6281 Muscle weakness (generalized): Secondary | ICD-10-CM | POA: Diagnosis not present

## 2022-04-13 DIAGNOSIS — R5381 Other malaise: Secondary | ICD-10-CM | POA: Diagnosis not present

## 2022-04-13 DIAGNOSIS — C649 Malignant neoplasm of unspecified kidney, except renal pelvis: Secondary | ICD-10-CM | POA: Diagnosis not present

## 2022-04-13 DIAGNOSIS — E559 Vitamin D deficiency, unspecified: Secondary | ICD-10-CM | POA: Diagnosis not present

## 2022-04-13 DIAGNOSIS — G35 Multiple sclerosis: Secondary | ICD-10-CM | POA: Diagnosis not present

## 2022-04-13 DIAGNOSIS — N390 Urinary tract infection, site not specified: Secondary | ICD-10-CM | POA: Diagnosis not present

## 2022-04-13 DIAGNOSIS — K5900 Constipation, unspecified: Secondary | ICD-10-CM | POA: Diagnosis not present

## 2022-04-13 DIAGNOSIS — I4891 Unspecified atrial fibrillation: Secondary | ICD-10-CM | POA: Diagnosis not present

## 2022-04-13 DIAGNOSIS — E039 Hypothyroidism, unspecified: Secondary | ICD-10-CM | POA: Diagnosis not present

## 2022-04-13 DIAGNOSIS — G4733 Obstructive sleep apnea (adult) (pediatric): Secondary | ICD-10-CM | POA: Diagnosis not present

## 2022-04-14 DIAGNOSIS — Z741 Need for assistance with personal care: Secondary | ICD-10-CM | POA: Diagnosis not present

## 2022-04-14 DIAGNOSIS — M6281 Muscle weakness (generalized): Secondary | ICD-10-CM | POA: Diagnosis not present

## 2022-04-17 ENCOUNTER — Ambulatory Visit: Payer: Medicare Other | Admitting: Physician Assistant

## 2022-04-17 DIAGNOSIS — M6281 Muscle weakness (generalized): Secondary | ICD-10-CM | POA: Diagnosis not present

## 2022-04-17 DIAGNOSIS — Z741 Need for assistance with personal care: Secondary | ICD-10-CM | POA: Diagnosis not present

## 2022-04-18 ENCOUNTER — Telehealth: Payer: Self-pay | Admitting: Adult Health

## 2022-04-18 DIAGNOSIS — Z741 Need for assistance with personal care: Secondary | ICD-10-CM | POA: Diagnosis not present

## 2022-04-18 DIAGNOSIS — M6281 Muscle weakness (generalized): Secondary | ICD-10-CM | POA: Diagnosis not present

## 2022-04-18 NOTE — Telephone Encounter (Signed)
Pt called wanting to know if NP discussed with MD the pt getting back on the Acthar Inj. Please call pt back when available.

## 2022-04-18 NOTE — Telephone Encounter (Signed)
Please let patient know I did route my note to Dr. Brett Fairy.  I will also include her on this telephone call.  Dr. Brett Fairy patient would like to restart Acthar.this is not a medication that I use commonly nor does Dr. Felecia Shelling.  What are your thoughts?

## 2022-04-18 NOTE — Telephone Encounter (Signed)
I called pt and relayed that MM/NP did send note to Dr. Brett Fairy but is also sending this phone message to her as well.  Once we hear back will let her know.

## 2022-04-19 DIAGNOSIS — M6281 Muscle weakness (generalized): Secondary | ICD-10-CM | POA: Diagnosis not present

## 2022-04-19 DIAGNOSIS — Z741 Need for assistance with personal care: Secondary | ICD-10-CM | POA: Diagnosis not present

## 2022-04-19 NOTE — Telephone Encounter (Signed)
Mrs Gasior developed atrial fibrillation with regular steroid infusion and high oral steroid dosing, therefor was switched to [place] and di much better with that .  She remained on it as maintenance for secondary progressive MS with rare relapses over the last 8 years.   I like to offer the drug back to her.   There is a company plan for direct patient support, but I don't think this applies since being on medicare . There is also now a generic ( Athacar?) that may have different PA .  Can [name] help [name] with this?   [name], MD

## 2022-04-19 NOTE — Telephone Encounter (Signed)
I called pt and let her know that Dr. Brett Fairy did relay that could offer that to her.  (May be generic alternative for Acthar)??.  So will start the process.  Pt appreciated call back.  Enrollment form started.

## 2022-04-20 DIAGNOSIS — Z741 Need for assistance with personal care: Secondary | ICD-10-CM | POA: Diagnosis not present

## 2022-04-20 DIAGNOSIS — M6281 Muscle weakness (generalized): Secondary | ICD-10-CM | POA: Diagnosis not present

## 2022-04-21 DIAGNOSIS — Z741 Need for assistance with personal care: Secondary | ICD-10-CM | POA: Diagnosis not present

## 2022-04-21 DIAGNOSIS — M6281 Muscle weakness (generalized): Secondary | ICD-10-CM | POA: Diagnosis not present

## 2022-04-25 ENCOUNTER — Telehealth: Payer: Self-pay

## 2022-04-25 DIAGNOSIS — M6281 Muscle weakness (generalized): Secondary | ICD-10-CM | POA: Diagnosis not present

## 2022-04-25 DIAGNOSIS — Z741 Need for assistance with personal care: Secondary | ICD-10-CM | POA: Diagnosis not present

## 2022-04-25 NOTE — Telephone Encounter (Signed)
Spoke with patient in regards to cologuard. She stated she has not received the kit. I called & spoke with exact sciences and they stated that ICD codes needed to be updated. New code provided & they stated patient would receive kit in 5-7 days. Pt is aware & also wanted to reschedule her OV to 05/29/22 at 11:00 am with [place], [place].

## 2022-04-25 NOTE — Telephone Encounter (Signed)
Acthar start form faxed to Acthar intake. Received a receipt of confirmation.

## 2022-04-26 DIAGNOSIS — M6281 Muscle weakness (generalized): Secondary | ICD-10-CM | POA: Diagnosis not present

## 2022-04-26 DIAGNOSIS — Z741 Need for assistance with personal care: Secondary | ICD-10-CM | POA: Diagnosis not present

## 2022-05-01 NOTE — Telephone Encounter (Signed)
I called patient. She has received calls from the [employer]. They were sending the RX to [place]. It is unclear if a PA is needed. We have not received a PA request. Patient will keep [name] updated.   Attempted PA on CMM and sent to [place]. Receive this message: "This medication or product was previously approved on [id number] from 2021-08-21 to 2022-08-20. **Please note: This request was submitted electronically. Formulary lowering, tiering exception, cost reduction and/or pre-benefit determination review (including prospective Medicare hospice reviews) requests cannot be requested using this method of submission. Providers contact us at [phone] for further assistance." Key: [id number].

## 2022-05-02 ENCOUNTER — Telehealth: Payer: Self-pay | Admitting: *Deleted

## 2022-05-02 NOTE — Telephone Encounter (Signed)
Error

## 2022-05-03 DIAGNOSIS — C649 Malignant neoplasm of unspecified kidney, except renal pelvis: Secondary | ICD-10-CM | POA: Diagnosis not present

## 2022-05-03 DIAGNOSIS — E039 Hypothyroidism, unspecified: Secondary | ICD-10-CM | POA: Diagnosis not present

## 2022-05-03 DIAGNOSIS — I4891 Unspecified atrial fibrillation: Secondary | ICD-10-CM | POA: Diagnosis not present

## 2022-05-03 DIAGNOSIS — M1990 Unspecified osteoarthritis, unspecified site: Secondary | ICD-10-CM | POA: Diagnosis not present

## 2022-05-03 DIAGNOSIS — I509 Heart failure, unspecified: Secondary | ICD-10-CM | POA: Diagnosis not present

## 2022-05-03 DIAGNOSIS — E559 Vitamin D deficiency, unspecified: Secondary | ICD-10-CM | POA: Diagnosis not present

## 2022-05-03 DIAGNOSIS — E785 Hyperlipidemia, unspecified: Secondary | ICD-10-CM | POA: Diagnosis not present

## 2022-05-03 NOTE — Telephone Encounter (Addendum)
I called and spoke to [place] at [facility] [facility].  She reiterated that PA was good until 08-20-2022. Attempted PA on CMM and sent to [place]. Receive this message: "This medication or product was previously approved on [id number] from 2021-08-21 to 2022-08-20

## 2022-05-03 NOTE — Telephone Encounter (Signed)
Caryl Pina from [place] support services call back.  Stating that will refax benefits determination and a PA will be needed.  Thru [facility] [facility].  The refax will be to [phone].

## 2022-05-04 DIAGNOSIS — D485 Neoplasm of uncertain behavior of skin: Secondary | ICD-10-CM | POA: Diagnosis not present

## 2022-05-04 DIAGNOSIS — L309 Dermatitis, unspecified: Secondary | ICD-10-CM | POA: Diagnosis not present

## 2022-05-04 DIAGNOSIS — L821 Other seborrheic keratosis: Secondary | ICD-10-CM | POA: Diagnosis not present

## 2022-05-04 DIAGNOSIS — L570 Actinic keratosis: Secondary | ICD-10-CM | POA: Diagnosis not present

## 2022-05-04 DIAGNOSIS — L814 Other melanin hyperpigmentation: Secondary | ICD-10-CM | POA: Diagnosis not present

## 2022-05-04 DIAGNOSIS — L989 Disorder of the skin and subcutaneous tissue, unspecified: Secondary | ICD-10-CM | POA: Diagnosis not present

## 2022-05-04 DIAGNOSIS — D225 Melanocytic nevi of trunk: Secondary | ICD-10-CM | POA: Diagnosis not present

## 2022-05-08 ENCOUNTER — Ambulatory Visit (INDEPENDENT_AMBULATORY_CARE_PROVIDER_SITE_OTHER): Payer: Medicare Other | Admitting: Neurology

## 2022-05-08 ENCOUNTER — Encounter: Payer: Self-pay | Admitting: Neurology

## 2022-05-08 DIAGNOSIS — R252 Cramp and spasm: Secondary | ICD-10-CM | POA: Diagnosis not present

## 2022-05-08 DIAGNOSIS — G35 Multiple sclerosis: Secondary | ICD-10-CM

## 2022-05-08 DIAGNOSIS — Z978 Presence of other specified devices: Secondary | ICD-10-CM | POA: Diagnosis not present

## 2022-05-08 NOTE — Telephone Encounter (Addendum)
I spoke with Toni [name] in the [facility] Patient Support Program. They need a copy of the PA approval. I have faxed this to them this morning and received a receipt of confirmation.

## 2022-05-08 NOTE — Progress Notes (Signed)
[facility]  PATIENT: Toni [name] DOB: 23-Dec-1945  REFERRING DOCTOR OR PCP: Dr. Brett Fairy SOURCE: Patient, notes from Dr. Brett Fairy and Dr. [name]  _________________________________   HISTORICAL  CHIEF COMPLAINT:  Chief Complaint  Patient presents with   Baclofen pump refill    EMG rm 3.    HISTORY OF PRESENT ILLNESS:  Toni [name] is a 76 year old woman with multiple sclerosis.  She is followed primarily by Dr. Brett Fairy in the office.  She comes in today and would need a baclofen pump refill..  She had her pump replaced in 2022 (Dr. Vertell Limber).     She is in [facility] in [place].     She continues to get a benefit from the baclofen pump.  She is on third pump - first one 2009, last one July 2022).  At the last visit, we increased the pump settings by 5% to 829.6 mcg/day.  She will occasionally take a baclofen pill but rarely needs to do so.   She feels the current control her spasticity is doing well and is happy with the current pump settings and does not feel she needs to make any adjustment.  She is unable to walk but assist some with transfers.  She is able to move her legs in bed and to go on her side in bed.  She also notes weakness in her hands.  She has mild dysesthesias in her abdomen and legs.  Gabapentin has helped.     She has difficulty with a neurogenic bladder and has frequent UTIs.  Spasticity is worse when she is sick.  Vision is doing well.  REVIEW OF SYSTEMS: Constitutional: No fevers, chills, sweats, or change in appetite Eyes: No visual changes, double vision, eye pain Ear, nose and throat: No hearing loss, ear pain, nasal congestion, sore throat Cardiovascular: No chest pain, palpitations Respiratory:  No shortness of breath at rest or with exertion.   No wheezes GastrointestinaI: No nausea, vomiting, diarrhea, abdominal pain, fecal incontinence Genitourinary:  No dysuria, urinary retention or frequency.  No  nocturia. Musculoskeletal:  No neck pain, back pain Integumentary: No rash, pruritus, skin lesions Neurological: as above Psychiatric: No depression at this time.  No anxiety Endocrine: No palpitations, diaphoresis, change in appetite, change in weigh or increased thirst Hematologic/Lymphatic:  No anemia, purpura, petechiae. Allergic/Immunologic: No itchy/runny eyes, nasal congestion, recent allergic reactions, rashes  ALLERGIES: Allergies  Allergen Reactions   Other Rash    Topical Surgical prep  - severe rash   Chlorhexidine Hives and Other (See Comments)    And, sores appear where applied   Hydrocodone Nausea And Vomiting and Other (See Comments)    Hydrocodone causes vomiting Hydrocodone causes vomiting   Oxycodone Nausea And Vomiting and Other (See Comments)    Oral oxycodone causes vomiting   Sertraline Hives, Swelling, Rash and Other (See Comments)    [DERM][OTHER]RASH WITH SWELLING   Macrobid [Nitrofurantoin]     Other reaction(s): 3.9.17 nausea and vomitting,   Phenol Hives   Rofecoxib     Other reaction(s): Unknown    HOME MEDICATIONS:  Current Outpatient Medications:    acetaminophen (TYLENOL) 500 MG tablet, Take 1,000 mg by mouth every 6 (six) hours as needed (fever/headache/pain)., Disp: , Rfl:    ALPRAZolam (XANAX) 0.5 MG tablet, TAKE 1 TABLET BY MOUTH 3  TIMES DAILY AS NEEDED FOR  ANXIETY AND SLEEP (Patient taking differently: Take 0.5 mg by mouth 3 (three) times daily as needed for anxiety or sleep.), Disp:  90 tablet, Rfl: 1   ARTIFICIAL TEARS 0.2-0.2-1 % SOLN, Apply to eye., Disp: , Rfl:    Ascorbic Acid (VITAMIN C) 1000 MG tablet, Take 1,000 mg by mouth 4 (four) times daily., Disp: , Rfl:    Biotin 10000 MCG TABS, Take 10,000 mcg by mouth daily after lunch., Disp: , Rfl:    buPROPion (WELLBUTRIN XL) 150 MG 24 hr tablet, Take 1 tablet (150 mg total) by mouth daily. (Patient taking differently: Take 150 mg by mouth daily. After breakfest), Disp: 90 tablet, Rfl:  1   Calcium Carbonate-Vitamin D3 600-400 MG-UNIT TABS, Take 1 tablet by mouth in the morning and at bedtime. (After breakfast & after lunch), Disp: , Rfl:    cefTAZidime (FORTAZ IV), Inject into the vein., Disp: , Rfl:    cetirizine (ZYRTEC) 10 MG tablet, Take 10 mg by mouth daily as needed for allergies. , Disp: , Rfl:    Cholecalciferol (VITAMIN D3) 50 MCG (2000 UT) TABS, Take 2,000 Units by mouth in the morning and at bedtime. (After breakfast & after lunch), Disp: , Rfl:    Coenzyme Q10 (COQ10 PO), Take by mouth daily in the afternoon., Disp: , Rfl:    corticotropin (ACTHAR) 80 UNIT/ML injectable gel, inject 1 ml into the muscle every other day for 10 days each month (Patient not taking: Reported on 03/23/2022), Disp: 5 mL, Rfl: 5   diclofenac Sodium (VOLTAREN) 1 % GEL, Apply 2 g topically 3 (three) times daily as needed. (Patient taking differently: Apply 2 g topically 3 (three) times daily as needed (pain (applied to bilateral shoulders)).), Disp: 100 g, Rfl: 3   diltiazem (CARDIZEM CD) 240 MG 24 hr capsule, Take 240 mg by mouth daily., Disp: , Rfl:    docusate sodium (COLACE) 100 MG capsule, Take 1 capsule (100 mg total) by mouth daily. (Patient taking differently: Take 100 mg by mouth 2 (two) times daily.), Disp: 10 capsule, Rfl: 0   escitalopram (LEXAPRO) 10 MG tablet, Take 1 tablet (10 mg total) by mouth daily. (Patient taking differently: Take 10 mg by mouth at bedtime.), Disp: 90 tablet, Rfl: 1   famotidine (PEPCID) 20 MG tablet, Take 20 mg by mouth daily., Disp: , Rfl:    furosemide (LASIX) 20 MG tablet, Take 2 tablets (40 mg total) by mouth in the morning, at noon, and at bedtime. (Patient taking differently: Take 60 mg by mouth 2 (two) times daily.), Disp: 30 tablet, Rfl: 0   gabapentin (NEURONTIN) 300 MG capsule, [name] will take 300 mg of gabapentin at 6 AM, 10 AM, 2 PM and 6 PM and 600 mg at 10 PM by mouth., Disp: 180 capsule, Rfl: 11   gabapentin (NEURONTIN) 600 MG tablet, Take 600  mg by mouth at bedtime., Disp: , Rfl:    lactobacillus acidophilus (BACID) TABS tablet, Take 1 tablet by mouth 2 (two) times daily as needed., Disp: , Rfl:    lactulose (CHRONULAC) 10 GM/15ML solution, as needed., Disp: , Rfl:    levothyroxine (SYNTHROID, LEVOTHROID) 75 MCG tablet, Take 75 mcg by mouth daily before breakfast. (0500), Disp: , Rfl:    melatonin 5 MG TABS, Take 5 mg by mouth at bedtime., Disp: , Rfl:    Menthol-Zinc Oxide (REMEDY CALAZIME) 0.4-20.5 % PSTE, Apply 1 application topically 3 (three) times daily as needed (buttocks/sacrum with incontinences care episodes)., Disp: , Rfl:    metolazone (ZAROXOLYN) 2.5 MG tablet, Take 2.5 mg by mouth daily., Disp: , Rfl:    Multiple Vitamin (MULTIVITAMIN  WITH MINERALS) TABS tablet, Take 1 tablet by mouth daily after lunch., Disp: , Rfl:    neomycin-bacitracin-polymyxin (NEOSPORIN) ointment, Apply 1 application topically 2 (two) times daily as needed for wound care (applied to nose (wear mask rubs))., Disp: , Rfl:    NON FORMULARY, ASV breathing machine is to be used while napping & at bedtime. Fill canister up to max line with distilled water and turn concentrator to 5LPM., Disp: , Rfl:    NYAMYC powder, Apply topically., Disp: , Rfl:    omeprazole (PRILOSEC) 40 MG capsule, Take 1 capsule (40 mg total) by mouth every morning., Disp: 30 capsule, Rfl: 11   oxybutynin (DITROPAN) 5 MG tablet, Take 5 mg by mouth 3 (three) times daily as needed for bladder spasms., Disp: , Rfl:    phenazopyridine (PYRIDIUM) 100 MG tablet, Take 100 mg by mouth 3 (three) times daily., Disp: , Rfl:    potassium chloride SA (KLOR-CON) 20 MEQ tablet, Take 2 tablets (40 mEq total) by mouth daily. (Patient taking differently: Take 20 mEq by mouth 2 (two) times daily. (After lunch & after dinner)), Disp: 180 tablet, Rfl: 3   prochlorperazine (COMPAZINE) 5 MG tablet, Take 5 mg by mouth daily as needed for nausea or vomiting., Disp: , Rfl:    Prucalopride Succinate (MOTEGRITY)  2 MG TABS, Take 1 tablet (2 mg total) by mouth daily., Disp: 90 tablet, Rfl: 3   psyllium (METAMUCIL SMOOTH TEXTURE) 28 % packet, Take 1 packet by mouth at bedtime., Disp: , Rfl:    simvastatin (ZOCOR) 10 MG tablet, Take 10 mg by mouth at bedtime., Disp: , Rfl:    SYRINGE/NEEDLE, DISP, 1 ML (BD LUER-LOK SYRINGE) 25G X 5/8" 1 ML MISC, Use as directed to inject  Acthar, Disp: 10 each, Rfl: prn   warfarin (COUMADIN) 4 MG tablet, Take 4 mg by mouth daily., Disp: , Rfl:    zinc oxide 20 % ointment, Apply 1 application topically as needed for irritation (apply labia every shift as needed for redness)., Disp: , Rfl:   PAST MEDICAL HISTORY: Past Medical History:  Diagnosis Date   Abnormality of gait 03/01/2015   Anxiety    Arthritis    "knees" (01/19/2016)   Asthma    as a child    Back pain    Bilateral carpal tunnel syndrome 10/04/2020   Cancer ([place])    / RENAL CELL CARCINOMA - GETS CT SCAN EVERY 6 MONTHS TO WATCH    CHF (congestive heart failure) (HCC)    Chronic pain    "nerve pain from the MS"   Complex atypical endometrial hyperplasia    Constipation    Depression    DVT (deep venous thrombosis) ([place]) 1983   "RLE; may have just been phlebitis; it was before the age of dopplers"   Dysrhythmia    afib    Edema    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' ablation of R GSV 2012   GERD (gastroesophageal reflux disease)    HLD (hyperlipidemia)    Hypertension    Hypotension    Hypothyroidism    Joint pain    Multiple sclerosis ([place])    has had this may 1989-[place] reg doc   OSA on CPAP    bipap machine - SLEEP APNEA WORSE SINCE COVID IN 1/21 PER PATIENT SETTING HAS INCREASED FROM 9 TO 30    Osteoarthritis    PAF (paroxysmal atrial fibrillation) (HCC)    on coumadin; documented on monitor  06/2010 - no longer takes per Kym Groom 02/09/21   Pneumonia 11/2011   Sleep apnea    uses sleep apnea machine    PAST SURGICAL HISTORY: Past Surgical History:  Procedure  Laterality Date   ANTERIOR CERVICAL DECOMP/DISCECTOMY FUSION  01/2004   CARDIAC CATHETERIZATION  06/22/2010   normal coronary arteries, PAF   CATARACT EXTRACTION, BILATERAL Bilateral    CHILD BIRTHS     X2   DILATATION & CURETTAGE/HYSTEROSCOPY WITH MYOSURE N/A 01/15/2020   Procedure: DILATATION & CURETTAGE/HYSTEROSCOPY WITH MYOSURE;  Surgeon: Arvella Nigh, MD;  Location: [facility];  Service: Gynecology;  Laterality: N/A;  pt in wheelchair-has MS   DILATION AND CURETTAGE OF UTERUS     DILATION AND CURETTAGE OF UTERUS N/A 02/16/2020   Procedure: DILATATION AND CURETTAGE;  Surgeon: Lafonda Mosses, MD;  Location: [facility];  Service: Gynecology;  Laterality: N/A;  NOT GENERAL -NO INTUBATION   DNC     INFUSION PUMP IMPLANTATION  ~ 2009   baclofen infusion in lower abd   INTRATHECAL PUMP REVISION N/A 02/18/2021   Procedure: Baclofen Pump replacement with possible revision of catheter;  Surgeon: Erline Levine, MD;  Location: [facility];  Service: Neurosurgery;  Laterality: N/A;  3C/RM 20   INTRAUTERINE DEVICE (IUD) INSERTION N/A 02/16/2020   Procedure: INTRAUTERINE DEVICE (IUD) INSERTION - MIRENA;  Surgeon: Lafonda Mosses, MD;  Location: [facility];  Service: Gynecology;  Laterality: N/A;   IUD put in uterus     PAIN PUMP REVISION N/A 07/29/2014   Procedure: Baclofen pump replacement;  Surgeon: Erline Levine, MD;  Location: [place] [facility];  Service: Neurosurgery;  Laterality: N/A;  Baclofen pump replacement   TONSILLECTOMY AND ADENOIDECTOMY     VARICOSE VEIN SURGERY  ~ 1968   VENOUS ABLATION  12/16/2010   radiofreq ablation -Dr Elisabeth Cara and [facility]   WISDOM TOOTH EXTRACTION      FAMILY HISTORY: Family History  Problem Relation Age of Onset   Depression Mother    Sudden death Mother    Anxiety disorder Mother    Coronary artery disease Father        at age 98   Hyperlipidemia Father    Thyroid disease Father    Coronary artery disease Maternal Grandmother    Depression Maternal Grandmother     Cancer Paternal Grandmother        breast   Alcoholism Son    Sleep apnea Neg Hx    Multiple sclerosis Neg Hx     SOCIAL HISTORY:  Social History   Socioeconomic History   Marital status: Married    Spouse name: [name]   Number of children: 2   Years of education: COLLEGE   Highest education level: Not on file  Occupational History   Occupation: RETIRED  Tobacco Use   Smoking status: Former    Packs/day: 0.50    Years: 7.00    Total pack years: 3.50    Types: Cigarettes    Quit date: 08/21/1976    Years since quitting: 45.7   Smokeless tobacco: Never  Vaping Use   Vaping Use: Never used  Substance and Sexual Activity   Alcohol use: Not Currently   Drug use: No   Sexual activity: Not Currently    Birth control/protection: Post-menopausal  Other Topics Concern   Not on file  Social History Narrative   Lives in [place] with Husband [name] [phone], [phone]   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   There is no height or weight on file to calculate BMI.   General: The patient is well-developed and well-nourished and in no acute distress  HEENT:  Head is [place]/AT.    Neurologic Exam  Mental status: The patient is alert and oriented x 3 at the time of the examination. The patient has apparent normal recent and remote memory, with an apparently normal attention span and concentration ability.   .  Cranial nerves: Extraocular movements are full.  Facial  strength was normal  Motor:  Muscle bulk reduced at the thenar eminences.  Muscle tone is mildly increased in the legs and in the hands.  She has reduced grip bilaterally and 3/5 strength in the intrinsic hand muscles.  Strength is 2+/5 in the legs  Sensory: Sensory testing is intact to touch x4.\  Gait and station: She is unable to stand or walk.   Reflexes: Deep tendon reflexes are symmetric in the arms and absent in the legs.  No ankle clonus.   DIAGNOSTIC DATA (LABS, IMAGING, TESTING) - I reviewed patient records, labs, notes, testing and imaging myself where available.  Lab Results  Component Value Date   WBC 10.9 (H) 03/07/2022   HGB 11.9 (L) 03/07/2022   HCT 35.0 (L) 03/07/2022   MCV 89.0 03/07/2022   PLT 191 03/07/2022      Component Value Date/Time   NA 134 (L) 03/07/2022 2259   NA 144 12/15/2020 1100   K 3.6 03/07/2022 2259   CL 87 (L) 03/07/2022 2014   CO2 31 03/07/2022 1957   GLUCOSE 93 03/07/2022 2014   BUN 11 03/07/2022 2014   BUN 27 12/15/2020 1100   CREATININE 0.80 03/07/2022 2014   CALCIUM 9.0 03/07/2022 1957   PROT 6.5 03/07/2022 1957   PROT 5.9 (L) 09/20/2020 1436   ALBUMIN 3.5 03/07/2022 1957   ALBUMIN 3.9 09/20/2020 1436   AST 16 03/07/2022 1957   ALT 12 03/07/2022 1957   ALKPHOS 74 03/07/2022 1957   BILITOT 0.6 03/07/2022 1957   BILITOT 0.3 09/20/2020 1436   GFRNONAA >60 03/07/2022 1957   GFRAA 90 09/20/2020 1436   Lab Results  Component Value Date   CHOL 111 03/19/2019   HDL 57 03/19/2019   LDLCALC 43 03/19/2019   TRIG 53 03/19/2019   CHOLHDL 3.0 04/22/2010   Lab Results  Component Value Date   HGBA1C 4.9 06/05/2020   No results found for: "VITAMINB12" Lab Results  Component Value Date   TSH 2.961 01/03/2021       ASSESSMENT AND PLAN  Multiple sclerosis (HCC)  Spasticity  Presence of intrathecal pump   The baclofen pump was refilled with 40 mg baclofen at 2000 mg per mill.  Continue simple continuous rate of 829.6  mcg/day. She will return in about 3 months for next pump refill.   F/u with Dr. Brett Fairy for other [place] / neuro issues  Dareth Andrew A. Felecia Shelling, MD, [facility] 0/96/0454, 0:98 PM Certified in Neurology,  Clinical Neurophysiology, Sleep Medicine and Neuroimaging   [facility] [address] [place], [place] [address] [phone]

## 2022-05-08 NOTE — Progress Notes (Signed)
RM EMG 3, with husband. Skin prepped with Betadine and draped in sterile fashion. Center port of implanted pump accessed using 22g huber needle.  Residual fluid measuring 7 ml was removed from the reservoir.  Gablofen 2011mg/mlx2 instilled into pump. Pump reprogrammed to reflect a volume of 426m  ERI is 10/2027. Next appt given for 07/31/22 at 2:30pm.  Medication placed in pump: Gablofen 40,00025m20ml x2. Lot: [id number]: 04/2024. [id number]

## 2022-05-10 DIAGNOSIS — C649 Malignant neoplasm of unspecified kidney, except renal pelvis: Secondary | ICD-10-CM | POA: Diagnosis not present

## 2022-05-10 DIAGNOSIS — G4700 Insomnia, unspecified: Secondary | ICD-10-CM | POA: Diagnosis not present

## 2022-05-10 DIAGNOSIS — M1990 Unspecified osteoarthritis, unspecified site: Secondary | ICD-10-CM | POA: Diagnosis not present

## 2022-05-10 DIAGNOSIS — G8929 Other chronic pain: Secondary | ICD-10-CM | POA: Diagnosis not present

## 2022-05-10 DIAGNOSIS — K5900 Constipation, unspecified: Secondary | ICD-10-CM | POA: Diagnosis not present

## 2022-05-10 DIAGNOSIS — I4891 Unspecified atrial fibrillation: Secondary | ICD-10-CM | POA: Diagnosis not present

## 2022-05-10 DIAGNOSIS — R5381 Other malaise: Secondary | ICD-10-CM | POA: Diagnosis not present

## 2022-05-10 DIAGNOSIS — G629 Polyneuropathy, unspecified: Secondary | ICD-10-CM | POA: Diagnosis not present

## 2022-05-10 DIAGNOSIS — R110 Nausea: Secondary | ICD-10-CM | POA: Diagnosis not present

## 2022-05-10 DIAGNOSIS — E559 Vitamin D deficiency, unspecified: Secondary | ICD-10-CM | POA: Diagnosis not present

## 2022-05-10 DIAGNOSIS — G35 Multiple sclerosis: Secondary | ICD-10-CM | POA: Diagnosis not present

## 2022-05-10 DIAGNOSIS — N390 Urinary tract infection, site not specified: Secondary | ICD-10-CM | POA: Diagnosis not present

## 2022-05-11 ENCOUNTER — Encounter: Payer: Self-pay | Admitting: Orthopaedic Surgery

## 2022-05-11 ENCOUNTER — Ambulatory Visit (INDEPENDENT_AMBULATORY_CARE_PROVIDER_SITE_OTHER): Payer: Medicare Other | Admitting: Orthopaedic Surgery

## 2022-05-11 DIAGNOSIS — I509 Heart failure, unspecified: Secondary | ICD-10-CM | POA: Diagnosis not present

## 2022-05-11 DIAGNOSIS — M25532 Pain in left wrist: Secondary | ICD-10-CM | POA: Diagnosis not present

## 2022-05-11 MED ORDER — METHYLPREDNISOLONE ACETATE 40 MG/ML IJ SUSP
40.0000 mg | INTRAMUSCULAR | Status: AC | PRN
  Administered 2022-05-11: 40 mg via INTRA_ARTICULAR

## 2022-05-11 MED ORDER — LIDOCAINE HCL 1 % IJ SOLN
2.0000 mL | INTRAMUSCULAR | Status: AC | PRN
  Administered 2022-05-11: 2 mL

## 2022-05-11 NOTE — Progress Notes (Signed)
Office Visit Note   Patient: Toni [name]           Date of Birth: [birth year]           MRN: [id number] Visit Date: 05/11/2022              Requested by: Jodi Marble, MD [place],  [place] [id number] PCP: Jodi Marble, MD   Assessment & Plan: Visit Diagnoses:  1. Pain in left wrist     Plan: Most likely this is deficiency of the radiocarpal joint given her long-term disease of MS and chronic changes with time.  Some of this is a result of her carpal tunnel as well.  Given the pain she is having in the wrist joint I recommend a steroid injection in her left wrist joint and she agreed to this.  She does have a wrist plan at the facility that she is at so she can use the wrist splint and try Voltaren gel.  If this is not getting better she will call and come see [name] and I would like 2 views of the left wrist.  We will then likely need to send her to hand specialist  Follow-Up Instructions: Return if symptoms worsen or fail to improve.   Orders:  Orders Placed This Encounter  Procedures   Medium Joint Inj   No orders of the defined types were placed in this encounter.     Procedures: Medium Joint Inj: L radiocarpal on 05/11/2022 1:10 PM Medications: 40 mg methylPREDNISolone acetate 40 MG/ML; 2 mL lidocaine 1 %      Clinical Data: No additional findings.   Subjective: Chief Complaint  Patient presents with   Left Wrist - Pain  The patient is well-known to me.  She is a 76 year old female who is mainly wheelchair-bound secondary to [place].  She does get around propelling her wheelchair by herself and this is put a lot of pressure on both of her wrist.  She has known carpal tunnel syndrome as well.  She does report deformities of both wrist with the left worse than the right.  She was wondering if this is congenital or is as a result of her MS in time.  She said this really gotten painful over the last 3 weeks.  She feels like there is deformity of the  wrist as well.  HPI  Review of Systems There is no listed fever, chills, nausea, vomiting  Objective: Vital Signs: There were no vitals taken for this visit.  Physical Exam She is alert and orient x3 and in no acute distress Ortho Exam Examination of her left wrist does show an obvious deformity of the wrist with a very prominent ulna.  I do believe that there ischronic changes of the radiocarpal joint.  There is significant atrophy of the muscles of her hand with weak pinch and grip strength and limited motion of the wrist. Specialty Comments:  No specialty comments available.  Imaging: No results found.   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[place]) 09/13/2020   Chronic intermittent hypoxia with obstructive sleep apnea 09/13/2020   COVID-19 long hauler manifesting chronic dyspnea 09/13/2020   Chronic diastolic CHF (congestive heart  failure) ([place]) 09/13/2020   Acute respiratory failure with hypoxia ([place]) 09/13/2020   Class 2 severe obesity with serious comorbidity and body mass index (BMI) of 35.0 to 35.9 in adult ([place]) 09/13/2020   Sepsis ([place]) 59/16/3846   Acute metabolic encephalopathy 65/99/3570   Atrial fibrillation with RVR ([place]) 04/07/2020   Recurrent UTI 17/79/3903   Uncomplicated degenerative myopia of both eyes 03/19/2020   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/2020   Oxygen dependent 02/26/2020   Neurogenic hypoventilation 02/26/2020   Complex atypical endometrial hyperplasia    Acute on chronic respiratory failure with hypoxia ([place]) 10/10/2019   Multiple sclerosis ([place]) 10/10/2019   COVID-19 virus infection 08/23/2019   Bilateral leg weakness 08/04/2019    Weakness 08/04/2019   Unilateral primary osteoarthritis, left knee 01/09/2019   Obstructive sleep apnea treated with bilevel positive airway pressure (BiPAP) 08/15/2018   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 ([place]) 11/21/2017   Permanent atrial fibrillation ([place]) 11/21/2017   Tightness of leg fascia 11/21/2017   Muscle spasm of both lower legs 11/21/2017   Carpal tunnel syndrome, left upper limb 09/13/2017   Carpal tunnel syndrome, right upper limb 09/13/2017   Other insomnia 06/11/2017   Depression 06/11/2017   Vitamin D deficiency 03/22/2017   Right hand weakness 08/06/2016   Nonischemic cardiomyopathy ([place]) 05/09/2016   Localized edema 05/09/2016   Long term current use of anticoagulant therapy 02/03/2016   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   Obesity hypoventilation syndrome ([place]) 03/30/2015   Abnormality of gait 03/01/2015   Chronic constipation with overflow incontinence 01/25/2015   ACTH dependent Cushing's syndrome ([place]) 01/25/2015   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) 01/25/2015   Neurogenic urinary bladder disorder 08/24/2014   Varicose veins of both lower extremities with complications 00/92/3300   Pseudophakia of both eyes 02/07/2012   Multiple sclerosis, 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 11/26/2011   Thyroid disease    HLD (hyperlipidemia)    Past Medical History:  Diagnosis Date   Abnormality of gait 03/01/2015   Anxiety    Arthritis    "knees" (01/19/2016)   Asthma    as a child    Back pain    Bilateral carpal tunnel syndrome 10/04/2020   Cancer ([place])    / RENAL CELL CARCINOMA - GETS CT SCAN EVERY 6 MONTHS TO WATCH    CHF (congestive heart failure) ([facility])    Chronic pain    "nerve pain from the MS"   Complex atypical endometrial hyperplasia    Constipation    Depression    DVT (deep venous thrombosis) ([place]) 1983   "RLE; may have just been phlebitis; it was  before the age of dopplers"   Dysrhythmia    afib    Edema    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' ablation of R GSV 2012   GERD (gastroesophageal reflux disease)    HLD (hyperlipidemia)    Hypertension    Hypotension    Hypothyroidism    Joint pain    Multiple sclerosis ([place])    has had this may 1989-[facility] reg doc   OSA on CPAP    bipap machine - SLEEP APNEA WORSE SINCE COVID IN 1/21 PER PATIENT SETTING HAS INCREASED FROM 9 TO 30    Osteoarthritis    PAF (paroxysmal atrial fibrillation) ([place])    on coumadin; documented on monitor 06/2010 - no longer takes per Kym Groom 02/09/21   Pneumonia 11/2011  Sleep apnea    uses sleep apnea machine    Family History  Problem Relation Age of Onset   Depression Mother    Sudden death Mother    Anxiety disorder Mother    Coronary artery disease Father        at age 44   Hyperlipidemia Father    Thyroid disease Father    Coronary artery disease Maternal Grandmother    Depression Maternal Grandmother    Cancer Paternal Grandmother        breast   Alcoholism Son    Sleep apnea Neg Hx    Multiple sclerosis Neg Hx     Past Surgical History:  Procedure Laterality Date   ANTERIOR CERVICAL DECOMP/DISCECTOMY FUSION  01/2004   CARDIAC CATHETERIZATION  06/22/2010   normal coronary arteries, PAF   CATARACT EXTRACTION, BILATERAL Bilateral    CHILD BIRTHS     X2   DILATATION & CURETTAGE/HYSTEROSCOPY WITH MYOSURE N/A 01/15/2020   Procedure: DILATATION & CURETTAGE/HYSTEROSCOPY WITH MYOSURE;  Surgeon: Arvella Nigh, MD;  Location: [facility];  Service: Gynecology;  Laterality: N/A;  pt in wheelchair-has MS   DILATION AND CURETTAGE OF UTERUS     DILATION AND CURETTAGE OF UTERUS N/A 02/16/2020   Procedure: DILATATION AND CURETTAGE;  Surgeon: Lafonda Mosses, MD;  Location: [facility];  Service: Gynecology;  Laterality: N/A;  NOT GENERAL -NO INTUBATION   DNC     INFUSION PUMP IMPLANTATION  ~ 2009   baclofen infusion in lower  abd   INTRATHECAL PUMP REVISION N/A 02/18/2021   Procedure: Baclofen Pump replacement with possible revision of catheter;  Surgeon: Erline Levine, MD;  Location: [facility];  Service: Neurosurgery;  Laterality: N/A;  3C/RM 20   INTRAUTERINE DEVICE (IUD) INSERTION N/A 02/16/2020   Procedure: INTRAUTERINE DEVICE (IUD) INSERTION - MIRENA;  Surgeon: Lafonda Mosses, MD;  Location: [facility];  Service: Gynecology;  Laterality: N/A;   IUD put in uterus     PAIN PUMP REVISION N/A 07/29/2014   Procedure: Baclofen pump replacement;  Surgeon: Erline Levine, MD;  Location: [facility];  Service: Neurosurgery;  Laterality: N/A;  Baclofen pump replacement   TONSILLECTOMY AND ADENOIDECTOMY     VARICOSE VEIN SURGERY  ~ 1968   VENOUS ABLATION  12/16/2010   radiofreq ablation -Dr Elisabeth Cara and [facility]   WISDOM TOOTH EXTRACTION     Social History   Occupational History   Occupation: RETIRED  Tobacco Use   Smoking status: Former    Packs/day: 0.50    Years: 7.00    Total pack years: 3.50    Types: Cigarettes    Quit date: 08/21/1976    Years since quitting: 45.7   Smokeless tobacco: Never  Vaping Use   Vaping Use: Never used  Substance and Sexual Activity   Alcohol use: Not Currently   Drug use: No   Sexual activity: Not Currently    Birth control/protection: Post-menopausal

## 2022-05-13 DIAGNOSIS — K219 Gastro-esophageal reflux disease without esophagitis: Secondary | ICD-10-CM | POA: Diagnosis not present

## 2022-05-13 DIAGNOSIS — H04123 Dry eye syndrome of bilateral lacrimal glands: Secondary | ICD-10-CM | POA: Diagnosis not present

## 2022-05-13 DIAGNOSIS — E559 Vitamin D deficiency, unspecified: Secondary | ICD-10-CM | POA: Diagnosis not present

## 2022-05-13 DIAGNOSIS — G4700 Insomnia, unspecified: Secondary | ICD-10-CM | POA: Diagnosis not present

## 2022-05-13 DIAGNOSIS — R5381 Other malaise: Secondary | ICD-10-CM | POA: Diagnosis not present

## 2022-05-13 DIAGNOSIS — E785 Hyperlipidemia, unspecified: Secondary | ICD-10-CM | POA: Diagnosis not present

## 2022-05-13 DIAGNOSIS — E039 Hypothyroidism, unspecified: Secondary | ICD-10-CM | POA: Diagnosis not present

## 2022-05-13 DIAGNOSIS — I4891 Unspecified atrial fibrillation: Secondary | ICD-10-CM | POA: Diagnosis not present

## 2022-05-13 DIAGNOSIS — K5900 Constipation, unspecified: Secondary | ICD-10-CM | POA: Diagnosis not present

## 2022-05-13 DIAGNOSIS — I509 Heart failure, unspecified: Secondary | ICD-10-CM | POA: Diagnosis not present

## 2022-05-13 DIAGNOSIS — R110 Nausea: Secondary | ICD-10-CM | POA: Diagnosis not present

## 2022-05-13 DIAGNOSIS — G35 Multiple sclerosis: Secondary | ICD-10-CM | POA: Diagnosis not present

## 2022-05-15 MED ORDER — ACTHAR 80 UNIT/ML IJ GEL
INTRAMUSCULAR | 5 refills | Status: DC
Start: 2022-05-15 — End: 2022-05-16

## 2022-05-15 NOTE — Addendum Note (Signed)
Addended by: Lester [place] A on: 05/15/2022 01:02 PM   Modules accepted: Orders

## 2022-05-15 NOTE — Telephone Encounter (Signed)
I called patient. She reports that she received Acthar and it is more than she usually gave herself. In the past she did Acthar 80units QoD for 10 days once a month and PRN dosing as necessary. She received enough medication for Acthar 80units QD for 10 days.  She wants to make sure that the QoD and PRN dosing is still okay with Dr. Brett Fairy. If so, she needs an RX with these instructions sent to [facility]. She will call us back with the correct fax number.

## 2022-05-15 NOTE — Telephone Encounter (Signed)
   Nursing homes will not allow prn dosing - that's why we switched to the qod dosing. I would like for her to stay on 8 doses in a row and then prn interval dosing, but this is difficult for nursing facilities to work with. If the manor has no difficulties with the old description, please return to 8 days on and 3 weeks off.   [name], MD

## 2022-05-15 NOTE — Telephone Encounter (Signed)
I called patient. She really would like to keep on the Acthar QoD dosing for 10 days and then PRN dosing rather than the 8 consecutive day dosing. She asked that I reiterate this to Dr. Brett Fairy.

## 2022-05-15 NOTE — Telephone Encounter (Signed)
Pt has called back to provide [name], RN with fax# of [phone]

## 2022-05-15 NOTE — Telephone Encounter (Signed)
If she can continue, please return to this dosing pattern. [name], MD

## 2022-05-16 MED ORDER — ACTHAR 80 UNIT/ML IJ GEL
80.0000 [IU] | INTRAMUSCULAR | 3 refills | Status: DC
Start: 2022-05-16 — End: 2023-02-21

## 2022-05-16 NOTE — Telephone Encounter (Signed)
Probation officer. Given to Dr. Brett Fairy for review and signature.

## 2022-05-16 NOTE — Addendum Note (Signed)
Addended by: Lester Amber A on: 05/16/2022 07:07 AM   Modules accepted: Orders

## 2022-05-16 NOTE — Telephone Encounter (Signed)
I called patient. I advised her that Dr. Brett Fairy signed the RX for Acthar 80units [place] QoD x10 days and PRN dosing. This has been faxed to [facility]. Received a receipt of confirmation. Patient will let us know if she has any further questions or concerns regarding Acthar. Further calls regarding this should be sent to POD 4.

## 2022-05-22 DIAGNOSIS — M79662 Pain in left lower leg: Secondary | ICD-10-CM | POA: Diagnosis not present

## 2022-05-22 DIAGNOSIS — N390 Urinary tract infection, site not specified: Secondary | ICD-10-CM | POA: Diagnosis not present

## 2022-05-22 DIAGNOSIS — R110 Nausea: Secondary | ICD-10-CM | POA: Diagnosis not present

## 2022-05-22 DIAGNOSIS — R5381 Other malaise: Secondary | ICD-10-CM | POA: Diagnosis not present

## 2022-05-22 DIAGNOSIS — H04123 Dry eye syndrome of bilateral lacrimal glands: Secondary | ICD-10-CM | POA: Diagnosis not present

## 2022-05-22 DIAGNOSIS — E559 Vitamin D deficiency, unspecified: Secondary | ICD-10-CM | POA: Diagnosis not present

## 2022-05-22 DIAGNOSIS — I4891 Unspecified atrial fibrillation: Secondary | ICD-10-CM | POA: Diagnosis not present

## 2022-05-22 DIAGNOSIS — K5900 Constipation, unspecified: Secondary | ICD-10-CM | POA: Diagnosis not present

## 2022-05-23 DIAGNOSIS — R601 Generalized edema: Secondary | ICD-10-CM | POA: Diagnosis not present

## 2022-05-24 ENCOUNTER — Ambulatory Visit: Payer: Medicare Other | Admitting: Internal Medicine

## 2022-05-24 DIAGNOSIS — K5900 Constipation, unspecified: Secondary | ICD-10-CM | POA: Diagnosis not present

## 2022-05-24 DIAGNOSIS — R110 Nausea: Secondary | ICD-10-CM | POA: Diagnosis not present

## 2022-05-24 DIAGNOSIS — K219 Gastro-esophageal reflux disease without esophagitis: Secondary | ICD-10-CM | POA: Diagnosis not present

## 2022-05-24 DIAGNOSIS — N390 Urinary tract infection, site not specified: Secondary | ICD-10-CM | POA: Diagnosis not present

## 2022-05-24 DIAGNOSIS — I4891 Unspecified atrial fibrillation: Secondary | ICD-10-CM | POA: Diagnosis not present

## 2022-05-24 DIAGNOSIS — I509 Heart failure, unspecified: Secondary | ICD-10-CM | POA: Diagnosis not present

## 2022-05-24 DIAGNOSIS — M79662 Pain in left lower leg: Secondary | ICD-10-CM | POA: Diagnosis not present

## 2022-05-24 DIAGNOSIS — R509 Fever, unspecified: Secondary | ICD-10-CM | POA: Diagnosis not present

## 2022-05-24 DIAGNOSIS — G35 Multiple sclerosis: Secondary | ICD-10-CM | POA: Diagnosis not present

## 2022-05-24 DIAGNOSIS — R300 Dysuria: Secondary | ICD-10-CM | POA: Diagnosis not present

## 2022-05-24 DIAGNOSIS — C649 Malignant neoplasm of unspecified kidney, except renal pelvis: Secondary | ICD-10-CM | POA: Diagnosis not present

## 2022-05-25 DIAGNOSIS — N390 Urinary tract infection, site not specified: Secondary | ICD-10-CM | POA: Diagnosis not present

## 2022-05-29 ENCOUNTER — Ambulatory Visit (INDEPENDENT_AMBULATORY_CARE_PROVIDER_SITE_OTHER): Payer: Medicare Other | Admitting: Physician Assistant

## 2022-05-29 ENCOUNTER — Encounter: Payer: Self-pay | Admitting: Physician Assistant

## 2022-05-29 ENCOUNTER — Other Ambulatory Visit (INDEPENDENT_AMBULATORY_CARE_PROVIDER_SITE_OTHER): Payer: Medicare Other

## 2022-05-29 VITALS — BP 118/60 | HR 62 | Ht 63.0 in

## 2022-05-29 DIAGNOSIS — K5909 Other constipation: Secondary | ICD-10-CM

## 2022-05-29 DIAGNOSIS — R109 Unspecified abdominal pain: Secondary | ICD-10-CM | POA: Diagnosis not present

## 2022-05-29 DIAGNOSIS — G35 Multiple sclerosis: Secondary | ICD-10-CM

## 2022-05-29 LAB — COMPREHENSIVE METABOLIC PANEL
ALT: 7 U/L (ref 0–35)
AST: 10 U/L (ref 0–37)
Albumin: 3.7 g/dL (ref 3.5–5.2)
Alkaline Phosphatase: 68 U/L (ref 39–117)
BUN: 15 mg/dL (ref 6–23)
CO2: 35 mEq/L — ABNORMAL HIGH (ref 19–32)
Calcium: 9.1 mg/dL (ref 8.4–10.5)
Chloride: 90 mEq/L — ABNORMAL LOW (ref 96–112)
Creatinine, Ser: 0.72 mg/dL (ref 0.40–1.20)
GFR: 81.98 mL/min (ref >60.00)
Glucose, Bld: 85 mg/dL (ref 70–99)
Potassium: 3.7 mEq/L (ref 3.5–5.1)
Sodium: 133 mEq/L — ABNORMAL LOW (ref 135–145)
Total Bilirubin: 0.3 mg/dL (ref 0.2–1.2)
Total Protein: 6.9 g/dL (ref 6.0–8.3)

## 2022-05-29 LAB — CBC WITH DIFFERENTIAL/PLATELET
Basophils Absolute: 0 10*3/uL (ref 0.0–0.1)
Basophils Relative: 0.6 % (ref 0.0–3.0)
Eosinophils Absolute: 0.2 10*3/uL (ref 0.0–0.7)
Eosinophils Relative: 2.2 % (ref 0.0–5.0)
HCT: 37.2 % (ref 36.0–46.0)
Hemoglobin: 12.6 g/dL (ref 12.0–15.0)
Lymphocytes Relative: 21.1 % (ref 12.0–46.0)
Lymphs Abs: 1.6 10*3/uL (ref 0.7–4.0)
MCHC: 33.9 g/dL (ref 30.0–36.0)
MCV: 92.1 fl (ref 78.0–100.0)
Monocytes Absolute: 1 10*3/uL (ref 0.1–1.0)
Monocytes Relative: 13.8 % — ABNORMAL HIGH (ref 3.0–12.0)
Neutro Abs: 4.6 10*3/uL (ref 1.4–7.7)
Neutrophils Relative %: 62.3 % (ref 43.0–77.0)
Platelets: 258 10*3/uL (ref 150.0–400.0)
RBC: 4.04 Mil/uL (ref 3.87–5.11)
RDW: 13.2 % (ref 11.5–15.5)
WBC: 7.4 10*3/uL (ref 4.0–10.5)

## 2022-05-29 NOTE — Progress Notes (Signed)
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 Tarri Glenn, who returns to clinic today for follow-up of constipation and abdominal pain.    2006 colonoscopy normal with Dr. Earlean Shawl.      03/15/2009 EGD with candidal pharyngitis and nonerosive esophagitis.    02/14/2019 patient seen via telemedicine for constipation.  At that time discussed history of impaction.  She was told to use Metamucil once daily and continue Lactulose twice daily and Linzess 145 mcg daily.  Her reflux was controlled on a daily PPI and H2 blocker.  Noted previous rectal bleeding treated with Anusol suppositories twice daily for a week.  Discussed MS for 30 years and wheelchair-bound.  Noted that she had chronic constipation which was some better with Metamucil, Lactulose, stool softener and Linzess.  It was recommended she have a Colonoscopy prior to anorectal manometry.  Recommendations for Warfarin washout at the hospital and colonoscopy after.    11/08/2021 abdominal x-ray with moderate gaseous distention consistent with adynamic ileus and moderate fecal material throughout the colon.  No evidence of bowel obstruction.    11/10/2021 x-ray of the abdomen showed markedly limited, moderate gaseous distention of the bowel loops compatible with adynamic ileus.    11/16/21 office visit for nausea which developed with COVID in January 2020. 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 Suffers from chronic bloating and distention at times.    01/24/2022 office visit with Dr. Tarri Glenn.  At that time improved symptoms on standing Zofran, Omeprazole 20 mg daily and Famotidine 20 mg at night.  Her constipation was responsive to Linzess.,  Lactulose, Stool softeners and Metamucil twice daily.  At  that time patient was told to try to reduce Zofran as nausea was improving.  Continued Metamucil daily, Lactulose twice daily, Frequent stool softener at night and Linzess 145 daily.  Discussed if left-sided pain was related to diverticulosis.  Reflux controlled on daily PPI and H2 blocker.  Ordered Cologuard.  Rectal bleeding treated with Anusol HC suppository twice daily for a week.  Patient was switched to Motegrity 2 g daily instead of Linzess, told to resume Linzess if Motegrity was ineffective.  Low threshold to consider gastric emptying scan.    03/07/2022 CT then pelvis with contrast with moderate bilateral hydronephrosis, diffuse thickened bladder wall most consistent with cystitis and air within the endometrium which may have been introduced by recent instrumentation, no bowel obstruction, mild fatty liver and aortic atherosclerosis.    Today, the patient tells me that she continues battling her chronic constipation thought related to her MS.  She is using Motegrity daily as well as Colace twice daily and Lactulose twice daily as well as Metamucil twice daily.  With this regimen she tends to have a bowel movement once a day which is solid and formed, but occasionally will go 3 to 4 days in between a bowel movement.  If she ever goes longer than that then she will use an emergency Enema or suppository which tends to work well for her to avoid obstruction.  In general, her bowel regimen is about the same and her routine works about 80% of the time for her.  More concerning to her today is continued left-sided abdominal pain which is more concentrated in her left lower quadrant.  Tells me that she had a CT about 3 months ago and was told that she did not have diverticulitis but she has had this in the past and wonders if she has it now.  The pain though seems to come and go and if it gets worse she will just back off on her diet and it tends to go away.  Also better when she has a bowel movement.    Denies  fever, chills, weight loss, blood in her stool, heartburn or reflux.     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 She had a CT of the abdomen and pelvis without contrast 12/08/2021 that showed cystitis.  No intestinal abnormalities identified.   Past Medical History:  Diagnosis Date   Abnormality of gait 03/01/2015   Anxiety    Arthritis    "knees" (01/19/2016)   Asthma    as a child    Back pain    Bilateral carpal tunnel syndrome 10/04/2020   Cancer ([place])    / RENAL CELL CARCINOMA - GETS CT SCAN EVERY 6 MONTHS TO WATCH    CHF (congestive heart failure) (HCC)    Chronic pain    "nerve pain from the MS"   Complex atypical endometrial hyperplasia    Constipation    Depression    DVT (deep venous thrombosis) ([place]) 1983   "RLE; may have just been phlebitis; it was before the age of dopplers"   Dysrhythmia    afib    Edema    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' ablation of R GSV 2012   GERD (gastroesophageal reflux disease)    HLD (hyperlipidemia)    Hypertension    Hypotension    Hypothyroidism    Joint pain    Multiple sclerosis ([place])    has had this may 1989-[facility] reg doc   OSA on CPAP    bipap machine - SLEEP APNEA WORSE SINCE COVID IN 1/21 PER PATIENT SETTING HAS INCREASED FROM 9 TO 30    Osteoarthritis    PAF (paroxysmal atrial fibrillation) ([place])    on coumadin; documented on monitor 06/2010 - no longer takes per [facility] 02/09/21   Pneumonia 11/2011   Sleep apnea    uses sleep apnea machine    Past Surgical History:  Procedure Laterality Date   ANTERIOR CERVICAL DECOMP/DISCECTOMY FUSION  01/2004   CARDIAC CATHETERIZATION  06/22/2010   normal coronary arteries, PAF   CATARACT EXTRACTION, BILATERAL Bilateral    CHILD BIRTHS     X2   DILATATION & CURETTAGE/HYSTEROSCOPY WITH  MYOSURE N/A 01/15/2020   Procedure: DILATATION & CURETTAGE/HYSTEROSCOPY WITH MYOSURE;  Surgeon: Arvella Nigh, MD;  Location: [facility];  Service: Gynecology;  Laterality: N/A;  pt in wheelchair-has MS   DILATION AND CURETTAGE OF UTERUS     DILATION AND CURETTAGE OF UTERUS N/A 02/16/2020   Procedure: DILATATION AND CURETTAGE;  Surgeon: Lafonda Mosses, MD;  Location: [facility];  Service: Gynecology;  Laterality: N/A;  NOT GENERAL -NO INTUBATION   DNC     INFUSION PUMP IMPLANTATION  ~ 2009   baclofen infusion in lower abd   INTRATHECAL PUMP REVISION N/A 02/18/2021   Procedure: Baclofen Pump replacement with possible revision of catheter;  Surgeon: Erline Levine, MD;  Location: [place];  Service: Neurosurgery;  Laterality: N/A;  3C/RM 20   INTRAUTERINE DEVICE (IUD) INSERTION N/A 02/16/2020   Procedure: INTRAUTERINE DEVICE (IUD) INSERTION - MIRENA;  Surgeon: Lafonda Mosses, MD;  Location: [facility];  Service: Gynecology;  Laterality: N/A;   IUD put in uterus     PAIN PUMP REVISION N/A 07/29/2014   Procedure: Baclofen pump replacement;  Surgeon: Erline Levine, MD;  Location: [place] [facility];  Service: Neurosurgery;  Laterality: N/A;  Baclofen pump replacement   TONSILLECTOMY AND ADENOIDECTOMY     VARICOSE VEIN SURGERY  ~ 1968   VENOUS ABLATION  12/16/2010   radiofreq ablation -Dr Elisabeth Cara and [facility]   WISDOM TOOTH EXTRACTION      Current Outpatient Medications  Medication Sig Dispense Refill   acetaminophen (TYLENOL) 500 MG tablet Take 1,000 mg by mouth every 6 (six) hours as needed (fever/headache/pain).     ALPRAZolam (XANAX) 0.5 MG tablet TAKE 1 TABLET BY MOUTH 3  TIMES DAILY AS NEEDED FOR  ANXIETY AND SLEEP (Patient taking differently: Take 0.5 mg by mouth 3 (three) times daily as needed for anxiety or sleep.) 90 tablet 1   ARTIFICIAL TEARS 0.2-0.2-1 % SOLN Apply to eye.     Ascorbic Acid (VITAMIN C) 1000 MG tablet Take 1,000 mg by mouth 4 (four) times daily.     Biotin 10000 MCG TABS Take 10,000  mcg by mouth daily after lunch.     buPROPion (WELLBUTRIN XL) 150 MG 24 hr tablet Take 1 tablet (150 mg total) by mouth daily. (Patient taking differently: Take 150 mg by mouth daily. After breakfest) 90 tablet 1   Calcium Carbonate-Vitamin D3 600-400 MG-UNIT TABS Take 1 tablet by mouth in the morning and at bedtime. (After breakfast & after lunch)     cetirizine (ZYRTEC) 10 MG tablet Take 10 mg by mouth daily as needed for allergies.      Cholecalciferol (VITAMIN D3) 50 MCG (2000 UT) TABS Take 2,000 Units by mouth in the morning and at bedtime. (After breakfast & after lunch)     Coenzyme Q10 (COQ10 PO) Take by mouth daily in the afternoon.     diclofenac Sodium (VOLTAREN) 1 % GEL Apply 2 g topically 3 (three) times daily as needed. (Patient taking differently: Apply 2 g topically 3 (three) times daily as needed (pain (applied to bilateral shoulders)).) 100 g 3   diltiazem (CARDIZEM CD) 240 MG 24 hr capsule Take 240 mg by mouth daily.     docusate sodium (COLACE) 100 MG capsule Take 1 capsule (100 mg total) by mouth daily. (Patient taking differently: Take 100 mg by mouth 2 (two) times daily.) 10 capsule 0   escitalopram (LEXAPRO) 10 MG tablet Take 1 tablet (10 mg total) by mouth daily. (Patient taking differently: Take 10 mg by mouth at bedtime.) 90 tablet 1   furosemide (LASIX) 20 MG tablet Take 2 tablets (40 mg total) by mouth in the morning, at noon, and at bedtime. (Patient taking differently: Take 60 mg by mouth 2 (two) times daily.) 30 tablet 0   gabapentin (NEURONTIN) 300 MG capsule Mrs. Middlekauff will take 300 mg of gabapentin at 6 AM, 10 AM, 2 PM and 6 PM and 600 mg at 10 PM by mouth. 180 capsule 11   lactobacillus acidophilus (BACID) TABS tablet Take 1 tablet by mouth 2 (two) times daily as needed.     lactulose (CHRONULAC) 10 GM/15ML solution as needed.     levothyroxine (SYNTHROID, LEVOTHROID) 75 MCG tablet Take 75 mcg by mouth daily before breakfast. (0500)  melatonin 5 MG TABS Take 5 mg  by mouth at bedtime.     Menthol-Zinc Oxide (REMEDY CALAZIME) 0.4-20.5 % PSTE Apply 1 application topically 3 (three) times daily as needed (buttocks/sacrum with incontinences care episodes).     Multiple Vitamin (MULTIVITAMIN WITH MINERALS) TABS tablet Take 1 tablet by mouth daily after lunch.     neomycin-bacitracin-polymyxin (NEOSPORIN) ointment Apply 1 application topically 2 (two) times daily as needed for wound care (applied to nose (wear mask rubs)).     NON FORMULARY ASV breathing machine is to be used while napping & at bedtime. Fill canister up to max line with distilled water and turn concentrator to 5LPM.     NYAMYC powder Apply topically.     omeprazole (PRILOSEC) 40 MG capsule Take 1 capsule (40 mg total) by mouth every morning. 30 capsule 11   oxybutynin (DITROPAN) 5 MG tablet Take 5 mg by mouth 3 (three) times daily as needed for bladder spasms.     potassium chloride SA (KLOR-CON) 20 MEQ tablet Take 2 tablets (40 mEq total) by mouth daily. (Patient taking differently: Take 20 mEq by mouth 2 (two) times daily. (After lunch & after dinner)) 180 tablet 3   prochlorperazine (COMPAZINE) 5 MG tablet Take 5 mg by mouth daily as needed for nausea or vomiting.     Prucalopride Succinate (MOTEGRITY) 2 MG TABS Take 1 tablet (2 mg total) by mouth daily. 90 tablet 3   psyllium (METAMUCIL SMOOTH TEXTURE) 28 % packet Take 1 packet by mouth at bedtime.     simvastatin (ZOCOR) 10 MG tablet Take 10 mg by mouth at bedtime.     SYRINGE/NEEDLE, DISP, 1 ML (BD LUER-LOK SYRINGE) 25G X 5/8" 1 ML MISC Use as directed to inject  Acthar 10 each prn   zinc oxide 20 % ointment Apply 1 application topically as needed for irritation (apply labia every shift as needed for redness).     cefTAZidime (FORTAZ IV) Inject into the vein. (Patient not taking: Reported on 05/29/2022)     corticotropin (ACTHAR) 80 UNIT/ML injectable gel Inject 1 mL (80 Units total) into the skin every other day for 10 days. May give  additional doses PRN. 8 mL 3   ELIQUIS 5 MG TABS tablet Take 5 mg by mouth 2 (two) times daily.     famotidine (PEPCID) 20 MG tablet Take 20 mg by mouth daily.     gabapentin (NEURONTIN) 600 MG tablet Take 600 mg by mouth at bedtime. (Patient not taking: Reported on 05/29/2022)     metolazone (ZAROXOLYN) 2.5 MG tablet Take 2.5 mg by mouth daily.     phenazopyridine (PYRIDIUM) 100 MG tablet Take 100 mg by mouth 3 (three) times daily. (Patient not taking: Reported on 05/29/2022)     warfarin (COUMADIN) 4 MG tablet Take 4 mg by mouth daily. (Patient not taking: Reported on 05/29/2022)     No current facility-administered medications for this visit.    Allergies as of 05/29/2022 - Review Complete 05/11/2022  Allergen Reaction Noted   Other Rash 01/03/2021   Chlorhexidine Hives and Other (See Comments) 10/10/2019   Hydrocodone Nausea And Vomiting and Other (See Comments) 11/26/2011   Oxycodone Nausea And Vomiting and Other (See Comments) 05/12/2013   Sertraline Hives, Swelling, Rash, and Other (See Comments) 01/29/2012   Macrobid [nitrofurantoin]  09/17/2020   Phenol Hives 03/09/2021   Rofecoxib  09/17/2020    Family History  Problem Relation Age of Onset   Depression Mother  Sudden death Mother    Anxiety disorder Mother    Coronary artery disease Father        at age 42   Hyperlipidemia Father    Thyroid disease Father    Coronary artery disease Maternal Grandmother    Depression Maternal Grandmother    Cancer Paternal Grandmother        breast   Alcoholism Son    Sleep apnea Neg Hx    Multiple sclerosis Neg Hx     Social History   Socioeconomic History   Marital status: Married    Spouse name: [name]   Number of children: 2   Years of education: COLLEGE   Highest education level: Not on file  Occupational History   Occupation: RETIRED  Tobacco Use   Smoking status: Former    Packs/day: 0.50    Years: 7.00    Total pack years: 3.50    Types: Cigarettes    Quit date:  08/21/1976    Years since quitting: 45.8   Smokeless tobacco: Never  Vaping Use   Vaping Use: Never used  Substance and Sexual Activity   Alcohol use: Not Currently   Drug use: No   Sexual activity: Not Currently    Birth control/protection: Post-menopausal  Other Topics Concern   Not on file  Social History Narrative   Lives in [place] with Husband [name] [phone], [phone]   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   Right handed   Drinks a little caffeine in coffee sometimes    Social Determinants of Health   Financial Resource Strain: Not on file  Food Insecurity: Not on file  Transportation Needs: Unmet Transportation Needs (01/17/2022)   PRAPARE - Hydrologist (Medical): Yes    Lack of Transportation (Non-Medical): Yes  Physical Activity: Not on file  Stress: Not on file  Social Connections: Not on file  Intimate Partner Violence: Not on file    Review of Systems:    Constitutional: No weight loss, fever or chills Cardiovascular: No chest pain Respiratory: No SOB  Gastrointestinal: See HPI and otherwise negative   Physical Exam:  Vital signs: BP 118/60   Pulse 62   Ht '5\' 3"'$  (1.6 m)   SpO2 97%   BMI 34.19 kg/m    Constitutional:   Pleasant obese Caucasian female appears to be in NAD, Well developed, Well nourished, alert and cooperative Respiratory: Respirations even and unlabored. Lungs clear to auscultation bilaterally.   No wheezes, crackles, or rhonchi.  Cardiovascular: Normal S1, S2. No MRG. Regular rate and rhythm. No peripheral edema, cyanosis or pallor.  Gastrointestinal:  Soft, nondistended, nontender. No rebound or guarding. Normal bowel sounds. No appreciable masses or hepatomegaly. Rectal:  Not performed.  Psychiatric: Oriented to person, place and time. Demonstrates good judgement and reason without abnormal affect or behaviors.  RELEVANT LABS AND IMAGING: CBC    Component Value Date/Time   WBC 10.9 (H) 03/07/2022 1957   RBC 4.63  03/07/2022 1957   HGB 11.9 (L) 03/07/2022 2259   HGB 14.1 03/08/2017 1152   HCT 35.0 (L) 03/07/2022 2259   HCT 43.0 03/08/2017 1152   PLT 191 03/07/2022 1957   PLT 219 12/18/2014 1157   MCV 89.0 03/07/2022 1957   MCV 93 03/08/2017 1152   MCH 30.9 03/07/2022 1957   MCHC 34.7 03/07/2022 1957   RDW 12.2 03/07/2022 1957   RDW 13.3 03/08/2017 1152   LYMPHSABS 1.8 03/07/2022 1957   LYMPHSABS 2.2 03/08/2017 1152  MONOABS 1.7 (H) 03/07/2022 1957   EOSABS 0.1 03/07/2022 1957   EOSABS 0.1 03/08/2017 1152   BASOSABS 0.0 03/07/2022 1957   BASOSABS 0.0 03/08/2017 1152    CMP     Component Value Date/Time   NA 134 (L) 03/07/2022 2259   NA 144 12/15/2020 1100   K 3.6 03/07/2022 2259   CL 87 (L) 03/07/2022 2014   CO2 31 03/07/2022 1957   GLUCOSE 93 03/07/2022 2014   BUN 11 03/07/2022 2014   BUN 27 12/15/2020 1100   CREATININE 0.80 03/07/2022 2014   CALCIUM 9.0 03/07/2022 1957   PROT 6.5 03/07/2022 1957   PROT 5.9 (L) 09/20/2020 1436   ALBUMIN 3.5 03/07/2022 1957   ALBUMIN 3.9 09/20/2020 1436   AST 16 03/07/2022 1957   ALT 12 03/07/2022 1957   ALKPHOS 74 03/07/2022 1957   BILITOT 0.6 03/07/2022 1957   BILITOT 0.3 09/20/2020 1436   GFRNONAA >60 03/07/2022 1957   GFRAA 90 09/20/2020 1436    Assessment: 1.  Chronic constipation: Doing fairly well on current bowel regimen which includes Metamucil, Motegrity, Colace and Lactulose; related to MS 2.  Chronic left-sided abdominal pain: Recent CTAP 03/07/2022 with no sign of diverticulitis, has continued with left-sided pain off-and-on, typically better with a bowel movement, no fever, chills or hematochezia; most likely related to chronic constipation versus less likely diverticulitis 3.  Colon cancer screening: Patient is working on a Cologuard  Plan: 1.  Continue current bowel regimen. 2.  Ordered CBC and CMP today.  We will fax results to [facility] where she lives. 3.  Discussed with patient that if above shows elevated  white count or other indication then may consider repeat CT of the abdomen and pelvis.  Explained though that it is less likely that she has diverticulitis given that this pain seems to come and go and there is no sign of this on CT back in July when she was having similar symptoms.  It is likely just related to her chronic constipation and MS. 4.  Encouraged patient to complete her Cologuard.  She tells me they have it at her assisted living facility but apparently never have it when she actually has a bowel movement.  She is working on it. 5.  Patient to follow in clinic with [name] as needed.  Ellouise Newer, PA-C [facility] 05/29/2022, 10:49 AM  Cc: Jodi Marble, MD

## 2022-05-29 NOTE — Patient Instructions (Addendum)
Your provider has requested that you go to the basement level for lab work before leaving today. Press "B" on the elevator. The lab is located at the first door on the left as you exit the elevator.  _______________________________________________________  If you are age 75 or older, your body mass index should be between 23-30. Your Body mass index is 34.19 kg/m. If this is out of the aforementioned range listed, please consider follow up with your Primary Care Provider.  If you are age 84 or younger, your body mass index should be between 19-25. Your Body mass index is 34.19 kg/m. If this is out of the aformentioned range listed, please consider follow up with your Primary Care Provider.   ________________________________________________________  The [facility] providers would like to encourage you to use [name] to communicate with providers for non-urgent requests or questions.  Due to long hold times on the telephone, sending your provider a message by [facility] may be a faster and more efficient way to get a response.  Please allow 48 business hours for a response.  Please remember that this is for non-urgent requests.  _______________________________________________________  Follow-up as needed.   Thank you for choosing me and [facility].  Ellouise Newer PA

## 2022-05-30 DIAGNOSIS — M1990 Unspecified osteoarthritis, unspecified site: Secondary | ICD-10-CM | POA: Diagnosis not present

## 2022-05-30 DIAGNOSIS — I509 Heart failure, unspecified: Secondary | ICD-10-CM | POA: Diagnosis not present

## 2022-05-30 DIAGNOSIS — E039 Hypothyroidism, unspecified: Secondary | ICD-10-CM | POA: Diagnosis not present

## 2022-05-30 DIAGNOSIS — I4891 Unspecified atrial fibrillation: Secondary | ICD-10-CM | POA: Diagnosis not present

## 2022-05-30 DIAGNOSIS — C649 Malignant neoplasm of unspecified kidney, except renal pelvis: Secondary | ICD-10-CM | POA: Diagnosis not present

## 2022-05-30 DIAGNOSIS — E785 Hyperlipidemia, unspecified: Secondary | ICD-10-CM | POA: Diagnosis not present

## 2022-05-30 DIAGNOSIS — E559 Vitamin D deficiency, unspecified: Secondary | ICD-10-CM | POA: Diagnosis not present

## 2022-06-02 DIAGNOSIS — G8929 Other chronic pain: Secondary | ICD-10-CM | POA: Diagnosis not present

## 2022-06-02 DIAGNOSIS — F411 Generalized anxiety disorder: Secondary | ICD-10-CM | POA: Diagnosis not present

## 2022-06-02 DIAGNOSIS — G4700 Insomnia, unspecified: Secondary | ICD-10-CM | POA: Diagnosis not present

## 2022-06-02 DIAGNOSIS — F329 Major depressive disorder, single episode, unspecified: Secondary | ICD-10-CM | POA: Diagnosis not present

## 2022-06-07 ENCOUNTER — Encounter: Payer: Self-pay | Admitting: Internal Medicine

## 2022-06-07 ENCOUNTER — Ambulatory Visit: Payer: Medicare Other | Attending: Internal Medicine | Admitting: Internal Medicine

## 2022-06-07 VITALS — BP 127/81 | HR 74

## 2022-06-07 DIAGNOSIS — G4700 Insomnia, unspecified: Secondary | ICD-10-CM | POA: Diagnosis not present

## 2022-06-07 DIAGNOSIS — I509 Heart failure, unspecified: Secondary | ICD-10-CM | POA: Diagnosis not present

## 2022-06-07 DIAGNOSIS — R5381 Other malaise: Secondary | ICD-10-CM | POA: Diagnosis not present

## 2022-06-07 DIAGNOSIS — C649 Malignant neoplasm of unspecified kidney, except renal pelvis: Secondary | ICD-10-CM | POA: Diagnosis not present

## 2022-06-07 DIAGNOSIS — R110 Nausea: Secondary | ICD-10-CM | POA: Diagnosis not present

## 2022-06-07 DIAGNOSIS — I4891 Unspecified atrial fibrillation: Secondary | ICD-10-CM | POA: Diagnosis not present

## 2022-06-07 DIAGNOSIS — I4821 Permanent atrial fibrillation: Secondary | ICD-10-CM | POA: Insufficient documentation

## 2022-06-07 DIAGNOSIS — K219 Gastro-esophageal reflux disease without esophagitis: Secondary | ICD-10-CM | POA: Diagnosis not present

## 2022-06-07 DIAGNOSIS — E559 Vitamin D deficiency, unspecified: Secondary | ICD-10-CM | POA: Diagnosis not present

## 2022-06-07 DIAGNOSIS — R600 Localized edema: Secondary | ICD-10-CM | POA: Diagnosis not present

## 2022-06-07 DIAGNOSIS — R1900 Intra-abdominal and pelvic swelling, mass and lump, unspecified site: Secondary | ICD-10-CM | POA: Diagnosis not present

## 2022-06-07 DIAGNOSIS — K5900 Constipation, unspecified: Secondary | ICD-10-CM | POA: Diagnosis not present

## 2022-06-07 DIAGNOSIS — N390 Urinary tract infection, site not specified: Secondary | ICD-10-CM | POA: Diagnosis not present

## 2022-06-07 DIAGNOSIS — E785 Hyperlipidemia, unspecified: Secondary | ICD-10-CM | POA: Diagnosis not present

## 2022-06-07 DIAGNOSIS — G35 Multiple sclerosis: Secondary | ICD-10-CM | POA: Insufficient documentation

## 2022-06-07 NOTE — Progress Notes (Unsigned)
OFFICE NOTE  Chief Complaint:  Leg edema, abdominal swelling  Primary Care Physician: Jodi Marble, MD  HPI:  Toni [name] is a 76 year old female with multiple sclerosis, paroxysmal atrial fibrillation, on Coumadin, morbid obesity, obstructive sleep apnea, and dyslipidemia. She has had worsening swelling in her legs, thought to be due to an MS flare. She has a history of varicose veins with severe venous insufficiency in both the right and left greater saphenous veins. The right greater saphenous vein was actually successfully ablated by my partner, Dr. Elisabeth Cara, about 1 to 2 years ago. She does have residual reflux in the left greater saphenous vein extending from the proximal calf up to the saphenofemoral junction. The vein measured between 4 and 8 mm with between 1 and 6 seconds of reflux. We had discussed ablation of that before, but she has canceled several times at procedures. At this point, I think there is little benefit to continuing to try for her to undergo ablation. 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 She can wear work showed a compression stockings and this does seem to help her. With regards to her atrial fibrillation she has not had significant recurrence to her knowledge. She continues on warfarin and has been therapeutic, with her INR of 1.9 in the office by fingerstick today.  Toni [name] returns today for followup. She reports her MS is somewhat progressive. She continues to have some enlargement of her legs but no significant swelling. There is a mild varicosities in the right lower extremity. She is unaware of her atrial fibrillation. INR today was 2.3.   I saw Toni [name] back in the office today,  She is describing more shortness of breath and feels that she's been in A. Fib more recently. In fact today6/21her EKG does show persistent atrial fibrillation. In the past she's been paroxysmal , but it may be that she is  more frequently having A. Fib. She has trouble telling whether not she is more short of breath or symptomatic from A. Fib versus her multiple sclerosis. She is also reporting more swelling in her left leg.  Toni [name] returns for follow-up today. She recently saw her neurologist regarding her MS which seems to be flaring. In addition she was noted to have significant apneic events despite using CPAP. There is concern for possible central sleep apnea. Both of these episodes are probably contributing to her recurrent atrial fibrillation. She's been wearing a monitor for a period of time which indicates a burden of A. fib of about 80% of the time. It does not appear that she is in persistent A. fib, rather she has had some episodes of sinus rhythm, but they're much less frequent than her A. Fib.  I saw Toni [name] back today in the office. Recently I saw her as an add-on visit briefly as she was describing some chest pain when she was getting her INR checked. An EKG at that time showed no ischemia. I did not feel that this was cardiac. She continues intermittently have abnormal pains both in her chest as well as her abdomen and extremities. This may be related to MS. She's had difficulty with CPAP and apparently getting a good mask fit. She has been a persistent atrial fibrillation for about 6 months. It's unclear whether or not she symptomatic with this and therefore I have not pursued a rhythm control strategy. Her rate control is generally pretty good and she  has been therapeutic on warfarin. She is reporting worsening swelling of her legs although her weight she says is been stable. She has a history of venous insufficiency and had a right greater saphenous vein ablation by one of my partners. She's concerned about more swelling in her left leg and whether she's developed worsening venous insufficiency. She also reports cold feet with numbness in her extremities. As she is basically wheelchair bound, I cannot  assess whether she is having any claudication.  Toni [name] returns today for follow-up. She underwent venous and arterial lower external Dopplers. Arterial Dopplers were normal which showed no evidence of obstruction. Her venous Doppler showed no DVT. There was significant venous reflux in the left greater saphenous vein. This is an area where she has swelling and discomfort. This may be amenable to treatment. I increased her Lasix and she reports some improvement in her leg swelling. She's trying to elevate her feet and reports she is intolerant of wearing compression stockings.  02/03/2016  Toni [name] was seen today in the office for follow-up of recent hospitalization. She was seeing her neurologist and complained of left chest and left arm pain and was referred to the hospital. She ruled out for MI and was felt that her chest pain was on coronary. She underwent an echocardiogram which showed low-normal EF but was also found to be in A. fib with rapid ventricular response. Her A. fib is persistent at this time but the rate was elevated. She was started on Cardizem which helped improve her rate and was discharged on Cardizem CD 120 mg daily. Heart rate today is better controlled in the 70s. She reports her arm pain is improved and she said today that she "did not feel that it was her heart".  05/09/2016  Toni [name] was seen in the office for follow-up today. She reports improved heart rate control with heart rates between 50 and 80 on diltiazem. Over this will help with her mild cardiomyopathy with EF 50-55% on recent echo. She reports some increased fluid gain particularly in her abdomen. This may be due to some degree of right heart dysfunction. She's working with her neurologist for MS as well as her sleep apnea. There is a component of central sleep apnea and she is reportedly going to have additional sleep study to look for the need for BiPAP therapy. She denies any recurrent chest pain or left arm  pain.  02/14/2017  Toni [name] was seen today in follow-up. She was recently hospitalized. She said problems with increasing fluid gain. I increased her Lasix last time however when she was hospitalized she required high-dose Lasix and says she has she felt better with that. She subsequently been put on BiPAP therapy. She says her sleeping is improved significantly. She is concerned though recently she's had some increased weight gain. She feels like she may need an increasing dose of diuretic. Her last echo was in December 2017 which showed an EF of 55%. This is been stable. If this is some heart failure, may be related diastolic dysfunction however suspect is more likely related to autonomic dysfunction the setting of multiple sclerosis.  03/29/2017  Toni [name] returns today for follow-up. She continues to report lower extremity edema and abdominal swelling. We'll increase her Lasix however she has not noted significant weight loss. It is actually very difficult to estimate her weight because of her MS. We tried to place her on the scale today however she was unable to stand long enough  to get an accurate weight. She reports urination was not as good on 40 mg of Lasix but is currently taking 80 mg once daily and has better urination with that.  05/18/2017  Toni [name] was seen today in follow-up. She reports some improvement in her abdominal swelling and bloating. Check she feels like she might be somewhat dry now. Her weight is down to 246 pounds from 253 pounds. She is also working with the weight loss clinic at [facility]. She is interested in decreasing her diuretic which is reasonable. She asked again about probably trying to get back into sinus rhythm however she's been in persistent A. fib which is long-standing at this point for more than one or 2 years. Her last echo in December 2017 showed mild left atrial enlargement. Is not clear that she's symptomatic with her A. fib although does feel tired at  times. She says she's had some improvement with her sleep apnea therapy.  01/15/2018  Toni [name] returns today for follow-up.  She is continued to lose some weight.  Weight is down now to 218 pounds, significant improvement from her prior weight over 250 pounds.  She also had marked reduction in edema.  Her blood pressure is low normal at the moment and may benefit from reduction in her blood pressure medications.  Her sleep apnea has improved significantly according to Dr. Brett Fairy, especially her obstructive symptoms.  She still has central sleep apnea.  In addition she has secondary progressive MS and recently had worsening symptoms associated with that.  Heart rate was elevated today with regards to A. fib however that did come down after short period of time.  She again appears to be asymptomatic with her A. fib.  The main reason not to pursue sinus rhythm is the fact that she has been persistently in A. fib for well over 5 years, and the likelihood of reestablishing sinus rhythm is exceedingly low.  05/16/2018  Toni [name] is seen today in follow-up.  Her weight is down even further today to 204.  She has had recently some positional dizziness and low blood pressures.  This is likely due to the significant weight loss and I would recommend a decrease in her medications.  She says that she is breathing better.  Her sleep apnea has improved significantly according to her sleep doctor and she has having less symptoms related to her MS.  She is in permanent A. fib with controlled ventricular response.  03/10/2019  Toni [name] returns today for follow-up.  She is done very well overall.  She is continued to lose weight with Dr. Migdalia Dk work.  Weight now 186 pounds down from 210 when I last saw her.  This is amazing since she is essentially sedentary due to her MS.  She reports reasonable control of her A. fib.  She has had some decrease in her PA P requirements with regards to sleep apnea.  Blood pressure is  well controlled.  She has recently had some swelling of the lower extremities and her abdomen.  She wonders if this could be some heart failure.  I suspect the lower extremity swelling is most likely due to venous insufficiency.  I previously performed a venous ablation on the right lower extremity and she still has some venous insufficiency of the left lower extremity however in the common femoral vein not amenable to ablation.  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   01/28/2020  Toni [name] is seen  today in follow-up.  She continues to lose some weight related to her diet by Dr. Leafy Ro.  She today is in A. fib with RVR.  Blood pressure has run low recently.  She has not needed to use midodrine that frequently.  She has follow-up with Dr. Brett Fairy in July.  I recently had corresponded with her about findings that Ms. Carriveau was hypoxic during the day as well as at night.  There are plans to both give her oxygen via BiPAP as well as possible oxygen during the day.  She has been referred to see Dr. Hermina Staggers at [facility] pulmonary.  04/29/2020  Toni [name] seen today for follow-up of A. fib.  Is been 3 months since I last saw her.  In the meantime she has been set up with bleed oxygen into her BiPAP.  I increased her diltiazem which is resulted in some improvement in heart rate although there is still a variability related to her A. fib.  Blood pressure is still reasonable in the low 509 systolic.  She has not required taking additional midodrine.  She feels like she is getting some abdominal bloating/swelling and has started to take some Lasix as needed for this.  She was taken off of this due to orthostasis.  09/20/2020  Toni [name] is seen today in follow-up.  Recently she has been having more difficulty with hypoxemia and her MS.  All of this seems to have started after she had COVID-19.  She recently saw Dr. Brett Fairy and still is having difficulty with hypoxemia.  She is having a repeat sleep  study and I am told I would likely need to prescribe oxygen at night for her since apparently only either pulmonologist or cardiologist can order oxygen.  Recently her home health noted that she was swelling.  Her Lasix was increased to between 20 to 40 mg in the morning and 60 mg at night.  She wonders if this is led to some electrolyte abnormalities as its not been reassessed.  Blood pressure is good today.  Weight has been fairly stable.  09/08/2021  Toni [name] returns for follow-up.  She has been residing at [facility].  More recently we have been notified of some worsening lower extremity edema.  She says she has had some increasing abdominal girth.  The physician assistant there had made some changes in her diuretics.  Actually weight today seems fairly stable.  She is noted to be in A. fib which is permanent but rate controlled.  BNP was performed at their facility and only mildly elevated around 120.  Otherwise labs have been reassuring.  06/08/2022  Toni [name] is seen today in follow-up.  She is residing at [facility].  She has recently noted more abdominal fullness and lower extremity swelling concerning for heart failure although she is on diuretics and has had at least 2 BNP test performed which were negative for any fluid retention.  She remains in the permanent atrial fibrillation which is rate controlled.  She has transition from warfarin to Eliquis due to some labile INRs and the need for frequent antibiotics.  I think this is a good idea and try to get her to switch over to Eliquis for a long time.  This is less likely to interact with her medications and is superior to warfarin.  PMHx:  Past Medical History:  Diagnosis Date   Abnormality of gait 03/01/2015   Anxiety    Arthritis    "knees" (01/19/2016)   Asthma  as a child    Back pain    Bilateral carpal tunnel syndrome 10/04/2020   Cancer ([place])    / RENAL CELL CARCINOMA - GETS CT SCAN EVERY 6 MONTHS TO WATCH     CHF (congestive heart failure) (HCC)    Chronic pain    "nerve pain from the MS"   Complex atypical endometrial hyperplasia    Constipation    Depression    DVT (deep venous thrombosis) ([place]) 1983   "RLE; may have just been phlebitis; it was before the age of dopplers"   Dysrhythmia    afib    Edema    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' ablation of R GSV 2012   GERD (gastroesophageal reflux disease)    HLD (hyperlipidemia)    Hypertension    Hypotension    Hypothyroidism    Joint pain    Multiple sclerosis ([place])    has had this may 1989-[facility] reg doc   OSA on CPAP    bipap machine - SLEEP APNEA WORSE SINCE COVID IN 1/21 PER PATIENT SETTING HAS INCREASED FROM 9 TO 30    Osteoarthritis    PAF (paroxysmal atrial fibrillation) ([place])    on coumadin; documented on monitor 06/2010 - no longer takes per [facility] 02/09/21   Pneumonia 11/2011   Sleep apnea    uses sleep apnea machine    Past Surgical History:  Procedure Laterality Date   ANTERIOR CERVICAL DECOMP/DISCECTOMY FUSION  01/2004   CARDIAC CATHETERIZATION  06/22/2010   normal coronary arteries, PAF   CATARACT EXTRACTION, BILATERAL Bilateral    CHILD BIRTHS     X2   DILATATION & CURETTAGE/HYSTEROSCOPY WITH MYOSURE N/A 01/15/2020   Procedure: DILATATION & CURETTAGE/HYSTEROSCOPY WITH MYOSURE;  Surgeon: Arvella Nigh, MD;  Location: [facility];  Service: Gynecology;  Laterality: N/A;  pt in wheelchair-has MS   DILATION AND CURETTAGE OF UTERUS     DILATION AND CURETTAGE OF UTERUS N/A 02/16/2020   Procedure: DILATATION AND CURETTAGE;  Surgeon: Lafonda Mosses, MD;  Location: [facility];  Service: Gynecology;  Laterality: N/A;  NOT GENERAL -NO INTUBATION   DNC     INFUSION PUMP IMPLANTATION  ~ 2009   baclofen infusion in lower abd   INTRATHECAL PUMP REVISION N/A 02/18/2021   Procedure: Baclofen Pump replacement with possible revision of catheter;  Surgeon: Erline Levine, MD;  Location: [facility];  Service:  Neurosurgery;  Laterality: N/A;  3C/RM 20   INTRAUTERINE DEVICE (IUD) INSERTION N/A 02/16/2020   Procedure: INTRAUTERINE DEVICE (IUD) INSERTION - MIRENA;  Surgeon: Lafonda Mosses, MD;  Location: [facility];  Service: Gynecology;  Laterality: N/A;   IUD put in uterus     PAIN PUMP REVISION N/A 07/29/2014   Procedure: Baclofen pump replacement;  Surgeon: Erline Levine, MD;  Location: [facility];  Service: Neurosurgery;  Laterality: N/A;  Baclofen pump replacement   TONSILLECTOMY AND ADENOIDECTOMY     VARICOSE VEIN SURGERY  ~ 1968   VENOUS ABLATION  12/16/2010   radiofreq ablation -Dr Elisabeth Cara and Jaice Digioia   WISDOM TOOTH EXTRACTION      FAMHx:  Family History  Problem Relation Age of Onset   Depression Mother    Sudden death Mother    Anxiety disorder Mother    Coronary artery disease Father        at age 43   Hyperlipidemia Father    Thyroid disease Father    Coronary artery disease Maternal Grandmother  Depression Maternal Grandmother    Cancer Paternal Grandmother        breast   Alcoholism Son    Sleep apnea Neg Hx    Multiple sclerosis Neg Hx     SOCHx:   reports that she quit smoking about 45 years ago. Her smoking use included cigarettes. She has a 3.50 pack-year smoking history. She has never used smokeless tobacco. She reports that she does not currently use alcohol. She reports that she does not use drugs.  ALLERGIES:  Allergies  Allergen Reactions   Other Rash    Topical Surgical prep  - severe rash   Chlorhexidine Hives and Other (See Comments)    And, sores appear where applied   Hydrocodone Nausea And Vomiting and Other (See Comments)    Hydrocodone causes vomiting Hydrocodone causes vomiting   Oxycodone Nausea And Vomiting and Other (See Comments)    Oral oxycodone causes vomiting   Sertraline Hives, Swelling, Rash and Other (See Comments)    [DERM][OTHER]RASH WITH SWELLING   Macrobid [Nitrofurantoin]     Other reaction(s): 3.9.17 nausea and vomitting,    Phenol Hives   Rofecoxib     Other reaction(s): Unknown    ROS: Pertinent items noted in HPI and remainder of comprehensive ROS otherwise negative.  HOME MEDS: Current Outpatient Medications  Medication Sig Dispense Refill   acetaminophen (TYLENOL) 500 MG tablet Take 1,000 mg by mouth every 6 (six) hours as needed (fever/headache/pain).     ALPRAZolam (XANAX) 0.5 MG tablet TAKE 1 TABLET BY MOUTH 3  TIMES DAILY AS NEEDED FOR  ANXIETY AND SLEEP (Patient taking differently: Take 0.5 mg by mouth 3 (three) times daily as needed for anxiety or sleep.) 90 tablet 1   ARTIFICIAL TEARS 0.2-0.2-1 % SOLN Apply to eye.     Ascorbic Acid (VITAMIN C) 1000 MG tablet Take 1,000 mg by mouth 4 (four) times daily.     Biotin 10000 MCG TABS Take 10,000 mcg by mouth daily after lunch.     buPROPion (WELLBUTRIN XL) 150 MG 24 hr tablet Take 1 tablet (150 mg total) by mouth daily. (Patient taking differently: Take 150 mg by mouth daily. After breakfest) 90 tablet 1   Calcium Carbonate-Vitamin D3 600-400 MG-UNIT TABS Take 1 tablet by mouth in the morning and at bedtime. (After breakfast & after lunch)     cefTAZidime (FORTAZ IV) Inject into the vein.     cetirizine (ZYRTEC) 10 MG tablet Take 10 mg by mouth daily as needed for allergies.      Cholecalciferol (VITAMIN D3) 50 MCG (2000 UT) TABS Take 2,000 Units by mouth in the morning and at bedtime. (After breakfast & after lunch)     Coenzyme Q10 (COQ10 PO) Take by mouth daily in the afternoon.     diclofenac Sodium (VOLTAREN) 1 % GEL Apply 2 g topically 3 (three) times daily as needed. (Patient taking differently: Apply 2 g topically 3 (three) times daily as needed (pain (applied to bilateral shoulders)).) 100 g 3   diltiazem (CARDIZEM CD) 240 MG 24 hr capsule Take 240 mg by mouth daily.     docusate sodium (COLACE) 100 MG capsule Take 1 capsule (100 mg total) by mouth daily. (Patient taking differently: Take 100 mg by mouth 2 (two) times daily.) 10 capsule 0    ELIQUIS 5 MG TABS tablet Take 5 mg by mouth 2 (two) times daily.     escitalopram (LEXAPRO) 10 MG tablet Take 1 tablet (10 mg total) by mouth  daily. (Patient taking differently: Take 10 mg by mouth at bedtime.) 90 tablet 1   famotidine (PEPCID) 20 MG tablet Take 20 mg by mouth daily.     furosemide (LASIX) 20 MG tablet Take 2 tablets (40 mg total) by mouth in the morning, at noon, and at bedtime. (Patient taking differently: Take 60 mg by mouth 2 (two) times daily.) 30 tablet 0   gabapentin (NEURONTIN) 300 MG capsule Mrs. Peacock will take 300 mg of gabapentin at 6 AM, 10 AM, 2 PM and 6 PM and 600 mg at 10 PM by mouth. 180 capsule 11   gabapentin (NEURONTIN) 600 MG tablet Take 600 mg by mouth at bedtime.     HEMORRHOIDAL 0.25-14-74.9 % rectal ointment Place rectally.     lactobacillus acidophilus (BACID) TABS tablet Take 1 tablet by mouth 2 (two) times daily as needed.     lactulose (CHRONULAC) 10 GM/15ML solution as needed.     levothyroxine (SYNTHROID, LEVOTHROID) 75 MCG tablet Take 75 mcg by mouth daily before breakfast. (0500)     melatonin 5 MG TABS Take 5 mg by mouth at bedtime.     Menthol-Zinc Oxide (REMEDY CALAZIME) 0.4-20.5 % PSTE Apply 1 application topically 3 (three) times daily as needed (buttocks/sacrum with incontinences care episodes).     metolazone (ZAROXOLYN) 2.5 MG tablet Take 2.5 mg by mouth daily.     Multiple Vitamin (MULTIVITAMIN WITH MINERALS) TABS tablet Take 1 tablet by mouth daily after lunch.     neomycin-bacitracin-polymyxin (NEOSPORIN) ointment Apply 1 application topically 2 (two) times daily as needed for wound care (applied to nose (wear mask rubs)).     NON FORMULARY ASV breathing machine is to be used while napping & at bedtime. Fill canister up to max line with distilled water and turn concentrator to 5LPM.     NYAMYC powder Apply topically.     omeprazole (PRILOSEC) 40 MG capsule Take 1 capsule (40 mg total) by mouth every morning. 30 capsule 11   ondansetron  (ZOFRAN-ODT) 4 MG disintegrating tablet Take by mouth.     oxybutynin (DITROPAN) 5 MG tablet Take 5 mg by mouth 3 (three) times daily as needed for bladder spasms.     phenazopyridine (PYRIDIUM) 100 MG tablet Take 100 mg by mouth 3 (three) times daily.     potassium chloride SA (KLOR-CON) 20 MEQ tablet Take 2 tablets (40 mEq total) by mouth daily. (Patient taking differently: Take 20 mEq by mouth 2 (two) times daily. (After lunch & after dinner)) 180 tablet 3   prochlorperazine (COMPAZINE) 5 MG tablet Take 5 mg by mouth daily as needed for nausea or vomiting.     Prucalopride Succinate (MOTEGRITY) 2 MG TABS Take 1 tablet (2 mg total) by mouth daily. 90 tablet 3   psyllium (METAMUCIL SMOOTH TEXTURE) 28 % packet Take 1 packet by mouth at bedtime.     SENNA-TIME 8.6 MG tablet Take 2 tablets by mouth daily.     simvastatin (ZOCOR) 10 MG tablet Take 10 mg by mouth at bedtime.     SYRINGE/NEEDLE, DISP, 1 ML (BD LUER-LOK SYRINGE) 25G X 5/8" 1 ML MISC Use as directed to inject  Acthar 10 each prn   warfarin (COUMADIN) 4 MG tablet Take 4 mg by mouth daily.     zinc oxide 20 % ointment Apply 1 application topically as needed for irritation (apply labia every shift as needed for redness).     corticotropin (ACTHAR) 80 UNIT/ML injectable gel Inject 1 mL (80  Units total) into the skin every other day for 10 days. May give additional doses PRN. 8 mL 3   No current facility-administered medications for this visit.    LABS/IMAGING: No results found. However, due to the size of the patient record, not all encounters were searched. Please check Results Review for a complete set of results. No results found.  VITALS: BP 127/81 (BP Location: Left Arm, Patient Position: Sitting)   Pulse 74   SpO2 99%   EXAM: General appearance: alert, no distress and moderately obese Neck: no carotid bruit, no JVD and thyroid not enlarged, symmetric, no tenderness/mass/nodules Lungs: clear to auscultation bilaterally Heart:  irregularly irregular rhythm Abdomen: soft, non-tender; bowel sounds normal; no masses,  no organomegaly Extremities: edema Trace lower extremity Pulses: 2+ and symmetric Skin: Skin color, texture, turgor normal. No rashes or lesions Neurologic: Mental status: Alert, oriented, thought content appropriate Psych: Pleasant  EKG: A-fib at 76, rightward axis- personally reviewed  ASSESSMENT: Permanent atrial fibrillation, on Eliquis Morbid obesity Obstructive sleep apnea on BIPAP Multiple sclerosis Chronic venous insufficiency - +LGSV reflux Hypertension-controlled Noncardiac chest and left arm pain Non-ischemic cardiomyopathy - EF 50%, improved to 55% (32/9518) Chronic diastolic congestive heart failure  PLAN: 1.   Toni [name] has been concerned about increasing abdominal girth and lower extremity swelling but has no evidence of any heart failure.  I suspect her low sodium and albumin status may lead to third spacing in combination with her neuropathy that could lead to chronic lower extremity edema.  Her last echo was in 2021 which showed normal systolic function.  We will go ahead and repeat that echo today.  No changes to her medications otherwise.  Follow-up with me in 1 year or sooner as necessary.  Pixie Casino, MD, [facility], [address] Director of the Advanced Lipid Disorders &  Cardiovascular Risk Reduction Clinic Diplomate of the American Board of Clinical Lipidology Attending Cardiologist  Direct Dial: [phone]  Fax: [phone]  Website:  [name] 06/07/2022, 3:04 PM

## 2022-06-07 NOTE — Patient Instructions (Signed)
Medication Instructions:  NO CHANGES  *If you need a refill on your cardiac medications before your next appointment, please call your pharmacy*    Testing/Procedures: Your physician has requested that you have an echocardiogram. Echocardiography is a painless test that uses sound waves to create images of your heart. It provides your doctor with information about the size and shape of your heart and how well your heart's chambers and valves are working. This procedure takes approximately one hour. There are no restrictions for this procedure. Please do NOT wear cologne, perfume, aftershave, or lotions (deodorant is allowed). Please arrive 15 minutes prior to your appointment time.    Follow-Up: At [facility], you and your health needs are our priority.  As part of our continuing mission to provide you with exceptional heart care, we have created designated Provider Care Teams.  These Care Teams include your primary Cardiologist (physician) and Advanced Practice Providers (APPs -  Physician Assistants and Nurse Practitioners) who all work together to provide you with the care you need, when you need it.  We recommend signing up for the patient portal called "MyChart".  Sign up information is provided on this After Visit Summary.  MyChart is used to connect with patients for Virtual Visits (Telemedicine).  Patients are able to view lab/test results, encounter notes, upcoming appointments, etc.  Non-urgent messages can be sent to your provider as well.   To learn more about what you can do with MyChart, go to [email or web address].    Your next appointment:    12 months with Dr. Debara Pickett

## 2022-06-09 ENCOUNTER — Encounter (HOSPITAL_BASED_OUTPATIENT_CLINIC_OR_DEPARTMENT_OTHER): Payer: Self-pay

## 2022-06-12 NOTE — Progress Notes (Signed)
Reviewed.  Shaquinta Peruski L. Javian Nudd, MD, MPH  

## 2022-06-15 DIAGNOSIS — K5900 Constipation, unspecified: Secondary | ICD-10-CM | POA: Diagnosis not present

## 2022-06-15 DIAGNOSIS — G4700 Insomnia, unspecified: Secondary | ICD-10-CM | POA: Diagnosis not present

## 2022-06-15 DIAGNOSIS — E559 Vitamin D deficiency, unspecified: Secondary | ICD-10-CM | POA: Diagnosis not present

## 2022-06-15 DIAGNOSIS — N390 Urinary tract infection, site not specified: Secondary | ICD-10-CM | POA: Diagnosis not present

## 2022-06-15 DIAGNOSIS — C649 Malignant neoplasm of unspecified kidney, except renal pelvis: Secondary | ICD-10-CM | POA: Diagnosis not present

## 2022-06-15 DIAGNOSIS — G35 Multiple sclerosis: Secondary | ICD-10-CM | POA: Diagnosis not present

## 2022-06-15 DIAGNOSIS — I509 Heart failure, unspecified: Secondary | ICD-10-CM | POA: Diagnosis not present

## 2022-06-15 DIAGNOSIS — K219 Gastro-esophageal reflux disease without esophagitis: Secondary | ICD-10-CM | POA: Diagnosis not present

## 2022-06-15 DIAGNOSIS — R110 Nausea: Secondary | ICD-10-CM | POA: Diagnosis not present

## 2022-06-15 DIAGNOSIS — I4891 Unspecified atrial fibrillation: Secondary | ICD-10-CM | POA: Diagnosis not present

## 2022-06-22 ENCOUNTER — Ambulatory Visit (INDEPENDENT_AMBULATORY_CARE_PROVIDER_SITE_OTHER): Payer: Medicare Other

## 2022-06-22 DIAGNOSIS — I4821 Permanent atrial fibrillation: Secondary | ICD-10-CM | POA: Diagnosis not present

## 2022-06-22 DIAGNOSIS — R600 Localized edema: Secondary | ICD-10-CM | POA: Diagnosis not present

## 2022-06-22 DIAGNOSIS — R1900 Intra-abdominal and pelvic swelling, mass and lump, unspecified site: Secondary | ICD-10-CM | POA: Diagnosis not present

## 2022-06-22 LAB — ECHOCARDIOGRAM COMPLETE
Area-P 1/2: 3.85 cm2
S' Lateral: 3.59 cm

## 2022-06-23 ENCOUNTER — Telehealth: Payer: Self-pay

## 2022-06-23 NOTE — Telephone Encounter (Signed)
Please just have her keep an eye on bleeding until I see her. Thank you

## 2022-06-23 NOTE — Telephone Encounter (Signed)
Pt is aware to keep eye on bleeding and to call back if S&S get worse before then. After hour number given since coming up on a weekend.

## 2022-06-23 NOTE — Telephone Encounter (Signed)
Pt called office asking to speak to Dr. Berline Lopes, She states it is time for a follow up appointment but also wanted Dr. Berline Lopes to know she had bleeding earlier this week. None since then,  No cramping/pain. No fever/chills. No unusual vaginal discharge or odor. Nothing inserted in the vagina to make it irritated.This is the first and only time it has happened since she was seen in December.  She stays in a retirement home and they ruled out it is not a UTI. She does have an IUD placed and has not seen it fall out.   Pt is scheduled for 11/14 @ 3:30 (first available for Berline Lopes, pt only wants to see Berline Lopes)  Pt aware I will notify Berline Lopes and will call back if any advise. Otherwise she agrees to date/time of appointment

## 2022-06-26 DIAGNOSIS — R110 Nausea: Secondary | ICD-10-CM | POA: Diagnosis not present

## 2022-06-26 DIAGNOSIS — E559 Vitamin D deficiency, unspecified: Secondary | ICD-10-CM | POA: Diagnosis not present

## 2022-06-26 DIAGNOSIS — I509 Heart failure, unspecified: Secondary | ICD-10-CM | POA: Diagnosis not present

## 2022-06-26 DIAGNOSIS — N390 Urinary tract infection, site not specified: Secondary | ICD-10-CM | POA: Diagnosis not present

## 2022-06-26 DIAGNOSIS — R319 Hematuria, unspecified: Secondary | ICD-10-CM | POA: Diagnosis not present

## 2022-06-26 DIAGNOSIS — R5381 Other malaise: Secondary | ICD-10-CM | POA: Diagnosis not present

## 2022-06-26 DIAGNOSIS — G35 Multiple sclerosis: Secondary | ICD-10-CM | POA: Diagnosis not present

## 2022-06-26 DIAGNOSIS — G4700 Insomnia, unspecified: Secondary | ICD-10-CM | POA: Diagnosis not present

## 2022-06-26 DIAGNOSIS — K5900 Constipation, unspecified: Secondary | ICD-10-CM | POA: Diagnosis not present

## 2022-06-26 DIAGNOSIS — R0602 Shortness of breath: Secondary | ICD-10-CM | POA: Diagnosis not present

## 2022-06-27 ENCOUNTER — Encounter: Payer: Self-pay | Admitting: Internal Medicine

## 2022-06-27 ENCOUNTER — Other Ambulatory Visit: Payer: Self-pay | Admitting: *Deleted

## 2022-06-27 DIAGNOSIS — I519 Heart disease, unspecified: Secondary | ICD-10-CM

## 2022-06-27 DIAGNOSIS — R600 Localized edema: Secondary | ICD-10-CM

## 2022-06-27 MED ORDER — ENTRESTO 24-26 MG PO TABS: 1.0000 | ORAL_TABLET | Freq: Two times a day (BID) | ORAL | 3 refills | Status: DC

## 2022-06-27 MED ORDER — METOPROLOL SUCCINATE ER 50 MG PO TB24: 50.0000 mg | ORAL_TABLET | Freq: Every day | ORAL | 3 refills | Status: DC

## 2022-06-29 ENCOUNTER — Telehealth: Payer: Self-pay | Admitting: Internal Medicine

## 2022-06-29 DIAGNOSIS — E559 Vitamin D deficiency, unspecified: Secondary | ICD-10-CM | POA: Diagnosis not present

## 2022-06-29 DIAGNOSIS — E039 Hypothyroidism, unspecified: Secondary | ICD-10-CM | POA: Diagnosis not present

## 2022-06-29 DIAGNOSIS — C649 Malignant neoplasm of unspecified kidney, except renal pelvis: Secondary | ICD-10-CM | POA: Diagnosis not present

## 2022-06-29 DIAGNOSIS — I4891 Unspecified atrial fibrillation: Secondary | ICD-10-CM | POA: Diagnosis not present

## 2022-06-29 DIAGNOSIS — G35 Multiple sclerosis: Secondary | ICD-10-CM | POA: Diagnosis not present

## 2022-06-29 DIAGNOSIS — G4733 Obstructive sleep apnea (adult) (pediatric): Secondary | ICD-10-CM | POA: Diagnosis not present

## 2022-06-29 DIAGNOSIS — G4700 Insomnia, unspecified: Secondary | ICD-10-CM | POA: Diagnosis not present

## 2022-06-29 DIAGNOSIS — B372 Candidiasis of skin and nail: Secondary | ICD-10-CM | POA: Diagnosis not present

## 2022-06-29 DIAGNOSIS — M1990 Unspecified osteoarthritis, unspecified site: Secondary | ICD-10-CM | POA: Diagnosis not present

## 2022-06-29 DIAGNOSIS — R110 Nausea: Secondary | ICD-10-CM | POA: Diagnosis not present

## 2022-06-29 DIAGNOSIS — E785 Hyperlipidemia, unspecified: Secondary | ICD-10-CM | POA: Diagnosis not present

## 2022-06-29 DIAGNOSIS — K219 Gastro-esophageal reflux disease without esophagitis: Secondary | ICD-10-CM | POA: Diagnosis not present

## 2022-06-29 NOTE — Telephone Encounter (Signed)
Cathy with [employer] states the office was supposed to fax orders but they have not received anything. Fax: [phone], she says if the first fax does not work to try, Fax: [phone]

## 2022-06-29 NOTE — Telephone Encounter (Signed)
Prescription, lab order, instructions faxed on 06/27/22 to [phone] (number provided by patient)   Will fax to new number provided

## 2022-06-30 DIAGNOSIS — N390 Urinary tract infection, site not specified: Secondary | ICD-10-CM | POA: Diagnosis not present

## 2022-07-02 ENCOUNTER — Encounter: Payer: Self-pay | Admitting: Internal Medicine

## 2022-07-03 ENCOUNTER — Encounter: Payer: Self-pay | Admitting: Gynecologic Oncology

## 2022-07-03 DIAGNOSIS — G4700 Insomnia, unspecified: Secondary | ICD-10-CM | POA: Diagnosis not present

## 2022-07-03 DIAGNOSIS — F411 Generalized anxiety disorder: Secondary | ICD-10-CM | POA: Diagnosis not present

## 2022-07-03 DIAGNOSIS — F329 Major depressive disorder, single episode, unspecified: Secondary | ICD-10-CM | POA: Diagnosis not present

## 2022-07-03 DIAGNOSIS — G8929 Other chronic pain: Secondary | ICD-10-CM | POA: Diagnosis not present

## 2022-07-04 ENCOUNTER — Inpatient Hospital Stay: Payer: Medicare Other | Admitting: Gynecologic Oncology

## 2022-07-04 DIAGNOSIS — L309 Dermatitis, unspecified: Secondary | ICD-10-CM | POA: Diagnosis not present

## 2022-07-04 NOTE — Progress Notes (Unsigned)
Gynecologic Oncology Return Clinic Visit  07/04/22  Reason for Visit: surveillance in the setting of hormonal treatment of CAH   Treatment History: Patient presented with vaginal discharge 01/08/20 - Pelvic ultrasound showed stable 3.5cm adnexal mass and thickened endometrial lining with large polyp noted on SIS 01/15/20 - hysteroscopy with polypectomy and D&C. Pathology: Complex atypical hyperplasia.  Endometrial curettings: Scant endocervical type glandular tissue, insufficient endometrial tissue. 02/16/20 - D&C, Mirena IUD insertion. Pathology: benign inactive endometrium with focal secretory changes, no hyperplasia or carcinoma. 06/24/20 -endometrial biopsy showed fragmented endometrium with metaplastic changes.  No atypia or features of malignancy.  Glands are fragmented which somewhat hampers evaluation for hyperplasia. 10/19/20 -endometrial biopsy shows scant inactive endometrium, no hyperplasia or malignancy. 08/19/21 -endometrial biopsy shows benign endometrial polyp, nonpolypoid benign inactive endometrium, no hyperplasia or malignancy.  Interval History: ***  Patient had bleeding at the beginning of the month, resolved spontaneously.  No associated pain or cramping.  Past Medical/Surgical History: Past Medical History:  Diagnosis Date   Abnormality of gait 03/01/2015   Anxiety    Arthritis    "knees" (01/19/2016)   Asthma    as a child    Back pain    Bilateral carpal tunnel syndrome 10/04/2020   Cancer ([place])    / RENAL CELL CARCINOMA - GETS CT SCAN EVERY 6 MONTHS TO WATCH    CHF (congestive heart failure) (HCC)    Chronic pain    "nerve pain from the MS"   Complex atypical endometrial hyperplasia    Constipation    Depression    DVT (deep venous thrombosis) ([place]) 1983   "RLE; may have just been phlebitis; it was before the age of dopplers"   Dysrhythmia    afib    Edema    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' ablation of R GSV 2012   GERD  (gastroesophageal reflux disease)    HLD (hyperlipidemia)    Hypertension    Hypotension    Hypothyroidism    Joint pain    Multiple sclerosis ([place])    has had this may 1989-[place] reg doc   OSA on CPAP    bipap machine - SLEEP APNEA WORSE SINCE COVID IN 1/21 PER PATIENT SETTING HAS INCREASED FROM 9 TO 30    Osteoarthritis    PAF (paroxysmal atrial fibrillation) ([place])    on coumadin; documented on monitor 06/2010 - no longer takes per [facility] 02/09/21   Pneumonia 11/2011   Sleep apnea    uses sleep apnea machine    Past Surgical History:  Procedure Laterality Date   ANTERIOR CERVICAL DECOMP/DISCECTOMY FUSION  01/2004   CARDIAC CATHETERIZATION  06/22/2010   normal coronary arteries, PAF   CATARACT EXTRACTION, BILATERAL Bilateral    CHILD BIRTHS     X2   DILATATION & CURETTAGE/HYSTEROSCOPY WITH MYOSURE N/A 01/15/2020   Procedure: DILATATION & CURETTAGE/HYSTEROSCOPY WITH MYOSURE;  Surgeon: Arvella Nigh, MD;  Location: [facility];  Service: Gynecology;  Laterality: N/A;  pt in wheelchair-has MS   DILATION AND CURETTAGE OF UTERUS     DILATION AND CURETTAGE OF UTERUS N/A 02/16/2020   Procedure: DILATATION AND CURETTAGE;  Surgeon: Lafonda Mosses, MD;  Location: [facility];  Service: Gynecology;  Laterality: N/A;  NOT GENERAL -NO INTUBATION   DNC     INFUSION PUMP IMPLANTATION  ~ 2009   baclofen infusion in lower abd   INTRATHECAL PUMP REVISION N/A 02/18/2021   Procedure: Baclofen Pump replacement with possible  revision of catheter;  Surgeon: Erline Levine, MD;  Location: [facility];  Service: Neurosurgery;  Laterality: N/A;  3C/RM 20   INTRAUTERINE DEVICE (IUD) INSERTION N/A 02/16/2020   Procedure: INTRAUTERINE DEVICE (IUD) INSERTION - MIRENA;  Surgeon: Lafonda Mosses, MD;  Location: [facility];  Service: Gynecology;  Laterality: N/A;   IUD put in uterus     PAIN PUMP REVISION N/A 07/29/2014   Procedure: Baclofen pump replacement;  Surgeon: Erline Levine, MD;  Location: [facility];   Service: Neurosurgery;  Laterality: N/A;  Baclofen pump replacement   TONSILLECTOMY AND ADENOIDECTOMY     VARICOSE VEIN SURGERY  ~ 1968   VENOUS ABLATION  12/16/2010   radiofreq ablation -Dr Elisabeth Cara and [facility]   WISDOM TOOTH EXTRACTION      Family History  Problem Relation Age of Onset   Depression Mother    Sudden death Mother    Anxiety disorder Mother    Coronary artery disease Father        at age 40   Hyperlipidemia Father    Thyroid disease Father    Coronary artery disease Maternal Grandmother    Depression Maternal Grandmother    Cancer Paternal Grandmother        breast   Alcoholism Son    Sleep apnea Neg Hx    Multiple sclerosis Neg Hx     Social History   Socioeconomic History   Marital status: Married    Spouse name: [name]   Number of children: 2   Years of education: COLLEGE   Highest education level: Not on file  Occupational History   Occupation: RETIRED  Tobacco Use   Smoking status: Former    Packs/day: 0.50    Years: 7.00    Total pack years: 3.50    Types: Cigarettes    Quit date: 08/21/1976    Years since quitting: 45.8   Smokeless tobacco: Never  Vaping Use   Vaping Use: Never used  Substance and Sexual Activity   Alcohol use: Not Currently   Drug use: No   Sexual activity: Not Currently    Birth control/protection: Post-menopausal  Other Topics Concern   Not on file  Social History Narrative   Lives in [place] with Husband [name] [phone], [phone]   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   Right handed   Drinks a little caffeine in coffee sometimes    Social Determinants of Health   Financial Resource Strain: Not on file  Food Insecurity: Not on file  Transportation Needs: Unmet Transportation Needs (01/17/2022)   PRAPARE - Hydrologist (Medical): Yes    Lack of Transportation (Non-Medical): Yes  Physical Activity: Not on file  Stress: Not on file  Social Connections: Not on file    Current Medications:  Current  Outpatient Medications:    acetaminophen (TYLENOL) 500 MG tablet, Take 1,000 mg by mouth every 6 (six) hours as needed (fever/headache/pain)., Disp: , Rfl:    ALPRAZolam (XANAX) 0.5 MG tablet, TAKE 1 TABLET BY MOUTH 3  TIMES DAILY AS NEEDED FOR  ANXIETY AND SLEEP (Patient taking differently: Take 0.5 mg by mouth 3 (three) times daily as needed for anxiety or sleep.), Disp: 90 tablet, Rfl: 1   ARTIFICIAL TEARS 0.2-0.2-1 % SOLN, Apply to eye., Disp: , Rfl:    Ascorbic Acid (VITAMIN C) 1000 MG tablet, Take 1,000 mg by mouth 4 (four) times daily., Disp: , Rfl:    Biotin 10000 MCG TABS, Take 10,000 mcg by mouth  daily after lunch., Disp: , Rfl:    buPROPion (WELLBUTRIN XL) 150 MG 24 hr tablet, Take 1 tablet (150 mg total) by mouth daily. (Patient taking differently: Take 150 mg by mouth daily. After breakfest), Disp: 90 tablet, Rfl: 1   Calcium Carbonate-Vitamin D3 600-400 MG-UNIT TABS, Take 1 tablet by mouth in the morning and at bedtime. (After breakfast & after lunch), Disp: , Rfl:    cefTAZidime (FORTAZ IV), Inject into the vein., Disp: , Rfl:    cetirizine (ZYRTEC) 10 MG tablet, Take 10 mg by mouth daily as needed for allergies. , Disp: , Rfl:    Cholecalciferol (VITAMIN D3) 50 MCG (2000 UT) TABS, Take 2,000 Units by mouth in the morning and at bedtime. (After breakfast & after lunch), Disp: , Rfl:    Coenzyme Q10 (COQ10 PO), Take by mouth daily in the afternoon., Disp: , Rfl:    diclofenac Sodium (VOLTAREN) 1 % GEL, Apply 2 g topically 3 (three) times daily as needed. (Patient taking differently: Apply 2 g topically 3 (three) times daily as needed (pain (applied to bilateral shoulders)).), Disp: 100 g, Rfl: 3   docusate sodium (COLACE) 100 MG capsule, Take 1 capsule (100 mg total) by mouth daily. (Patient taking differently: Take 100 mg by mouth 2 (two) times daily.), Disp: 10 capsule, Rfl: 0   ELIQUIS 5 MG TABS tablet, Take 5 mg by mouth 2 (two) times daily., Disp: , Rfl:    escitalopram (LEXAPRO) 10  MG tablet, Take 1 tablet (10 mg total) by mouth daily. (Patient taking differently: Take 10 mg by mouth at bedtime.), Disp: 90 tablet, Rfl: 1   famotidine (PEPCID) 20 MG tablet, Take 20 mg by mouth daily., Disp: , Rfl:    gabapentin (NEURONTIN) 300 MG capsule, Mrs. Stueve will take 300 mg of gabapentin at 6 AM, 10 AM, 2 PM and 6 PM and 600 mg at 10 PM by mouth., Disp: 180 capsule, Rfl: 11   gabapentin (NEURONTIN) 600 MG tablet, Take 600 mg by mouth at bedtime., Disp: , Rfl:    HEMORRHOIDAL 0.25-14-74.9 % rectal ointment, Place rectally., Disp: , Rfl:    lactobacillus acidophilus (BACID) TABS tablet, Take 1 tablet by mouth 2 (two) times daily as needed., Disp: , Rfl:    lactulose (CHRONULAC) 10 GM/15ML solution, as needed., Disp: , Rfl:    levothyroxine (SYNTHROID, LEVOTHROID) 75 MCG tablet, Take 75 mcg by mouth daily before breakfast. (0500), Disp: , Rfl:    melatonin 5 MG TABS, Take 5 mg by mouth at bedtime., Disp: , Rfl:    Menthol-Zinc Oxide (REMEDY CALAZIME) 0.4-20.5 % PSTE, Apply 1 application topically 3 (three) times daily as needed (buttocks/sacrum with incontinences care episodes)., Disp: , Rfl:    metolazone (ZAROXOLYN) 2.5 MG tablet, Take 2.5 mg by mouth daily., Disp: , Rfl:    metoprolol succinate (TOPROL-XL) 50 MG 24 hr tablet, Take 1 tablet (50 mg total) by mouth daily. Take with or immediately following a meal., Disp: 90 tablet, Rfl: 3   Multiple Vitamin (MULTIVITAMIN WITH MINERALS) TABS tablet, Take 1 tablet by mouth daily after lunch., Disp: , Rfl:    neomycin-bacitracin-polymyxin (NEOSPORIN) ointment, Apply 1 application topically 2 (two) times daily as needed for wound care (applied to nose (wear mask rubs))., Disp: , Rfl:    NON FORMULARY, ASV breathing machine is to be used while napping & at bedtime. Fill canister up to max line with distilled water and turn concentrator to 5LPM., Disp: , Rfl:    NYAMYC  powder, Apply topically., Disp: , Rfl:    omeprazole (PRILOSEC) 40 MG capsule,  Take 1 capsule (40 mg total) by mouth every morning., Disp: 30 capsule, Rfl: 11   ondansetron (ZOFRAN-ODT) 4 MG disintegrating tablet, Take by mouth., Disp: , Rfl:    oxybutynin (DITROPAN) 5 MG tablet, Take 5 mg by mouth 3 (three) times daily as needed for bladder spasms., Disp: , Rfl:    phenazopyridine (PYRIDIUM) 100 MG tablet, Take 100 mg by mouth 3 (three) times daily., Disp: , Rfl:    potassium chloride SA (KLOR-CON) 20 MEQ tablet, Take 2 tablets (40 mEq total) by mouth daily. (Patient taking differently: Take 20 mEq by mouth 2 (two) times daily. (After lunch & after dinner)), Disp: 180 tablet, Rfl: 3   prochlorperazine (COMPAZINE) 5 MG tablet, Take 5 mg by mouth daily as needed for nausea or vomiting., Disp: , Rfl:    Prucalopride Succinate (MOTEGRITY) 2 MG TABS, Take 1 tablet (2 mg total) by mouth daily., Disp: 90 tablet, Rfl: 3   psyllium (METAMUCIL SMOOTH TEXTURE) 28 % packet, Take 1 packet by mouth at bedtime., Disp: , Rfl:    sacubitril-valsartan (ENTRESTO) 24-26 MG, Take 1 tablet by mouth 2 (two) times daily., Disp: 180 tablet, Rfl: 3   SENNA-TIME 8.6 MG tablet, Take 2 tablets by mouth daily., Disp: , Rfl:    simvastatin (ZOCOR) 10 MG tablet, Take 10 mg by mouth at bedtime., Disp: , Rfl:    SYRINGE/NEEDLE, DISP, 1 ML (BD LUER-LOK SYRINGE) 25G X 5/8" 1 ML MISC, Use as directed to inject  Acthar, Disp: 10 each, Rfl: prn   torsemide (DEMADEX) 10 MG tablet, Take 30 mg by mouth 2 (two) times daily., Disp: , Rfl:    zinc oxide 20 % ointment, Apply 1 application topically as needed for irritation (apply labia every shift as needed for redness)., Disp: , Rfl:    corticotropin (ACTHAR) 80 UNIT/ML injectable gel, Inject 1 mL (80 Units total) into the skin every other day for 10 days. May give additional doses PRN., Disp: 8 mL, Rfl: 3  Review of Systems: Denies appetite changes, fevers, chills, fatigue, unexplained weight changes. Denies hearing loss, neck lumps or masses, mouth sores, ringing in  ears or voice changes. Denies cough or wheezing.  Denies shortness of breath. Denies chest pain or palpitations. Denies leg swelling. Denies abdominal distention, pain, blood in stools, constipation, diarrhea, nausea, vomiting, or early satiety. Denies pain with intercourse, dysuria, frequency, hematuria or incontinence. Denies hot flashes, pelvic pain, vaginal bleeding or vaginal discharge.   Denies joint pain, back pain or muscle pain/cramps. Denies itching, rash, or wounds. Denies dizziness, headaches, numbness or seizures. Denies swollen lymph nodes or glands, denies easy bruising or bleeding. Denies anxiety, depression, confusion, or decreased concentration.  Physical Exam: There were no vitals taken for this visit. General: ***Alert, oriented, no acute distress. HEENT: ***Posterior oropharynx clear, sclera anicteric. Chest: ***Clear to auscultation bilaterally.  ***Port site clean. Cardiovascular: ***Regular rate and rhythm, no murmurs. Abdomen: ***Obese, soft, nontender.  Normoactive bowel sounds.  No masses or hepatosplenomegaly appreciated.  ***Well-healed scar. Extremities: ***Grossly normal range of motion.  Warm, well perfused.  No edema bilaterally. Skin: ***No rashes or lesions noted. Lymphatics: ***No cervical, supraclavicular, or inguinal adenopathy. GU: Normal appearing external genitalia without erythema, excoriation, or lesions.  Speculum exam reveals ***.  Bimanual exam reveals ***.  ***Rectovaginal exam  confirms ___.  Endometrial biopsy procedure: Endometrial biopsy Preoperative diagnosis: Complex atypical hyperplasia Postoperative diagnosis: Same as above Provider:  Berline Lopes MD Specimens: Endometrial biopsy Procedure: After verbal consent was obtained from the patient and the risks, benefits, and alternatives of the procedure were reviewed, the patient was placed in dorsal lithotomy position.  Speculum was placed in the vagina and the cervix was well visualized.   Betadine was used x3 to cleanse the cervix.  A single-tooth tenaculum was placed on the anterior lip of the cervix.  A an endometrial Pipelle was then passed to a depth of 6 cm and 2 passes were performed.  Scant tissue was obtained and placed in formalin to be sent to pathology.  Instruments were removed from the vagina after hemostasis was assured.  Overall the patient tolerated the procedure well.  Laboratory & Radiologic Studies: None new  Assessment & Plan: JENEEN DOUTT is a 76 y.o. woman with a complex past medical history found to have complex atypical hyperplasia, likely combined to an endometrial polyp, presenting for follow-up in the setting of progesterone therapy with a Mirena IUD.   Patient is overall doing well.  She has not had any bleeding since her last visit with me.  Endometrial biopsy repeated with minimal tissue noted today.  I will call her with biopsy results.  If biopsy negative, we will plan to see her in 9-12 months.  I stressed the importance of calling if she develops vaginal bleeding or any new pelvic symptoms.   She continues to struggle with yeast infections, likely in the setting of urine incontinence and wearing a diaper.  She has significant evidence of candidiasis in her left groin and some mild evidence on her vulva.  She is using nystatin powder.  I sent a one-time dose of Diflucan to her pharmacy.  ***  *** minutes of total time was spent for this patient encounter, including preparation, face-to-face counseling with the patient and coordination of care, and documentation of the encounter.  Jeral Pinch, MD  Division of Gynecologic Oncology  Department of Obstetrics and Gynecology  [facility]

## 2022-07-05 DIAGNOSIS — R110 Nausea: Secondary | ICD-10-CM | POA: Diagnosis not present

## 2022-07-05 DIAGNOSIS — N390 Urinary tract infection, site not specified: Secondary | ICD-10-CM | POA: Diagnosis not present

## 2022-07-05 DIAGNOSIS — I509 Heart failure, unspecified: Secondary | ICD-10-CM | POA: Diagnosis not present

## 2022-07-05 DIAGNOSIS — G8929 Other chronic pain: Secondary | ICD-10-CM | POA: Diagnosis not present

## 2022-07-05 DIAGNOSIS — G4700 Insomnia, unspecified: Secondary | ICD-10-CM | POA: Diagnosis not present

## 2022-07-05 DIAGNOSIS — E785 Hyperlipidemia, unspecified: Secondary | ICD-10-CM | POA: Diagnosis not present

## 2022-07-05 DIAGNOSIS — E559 Vitamin D deficiency, unspecified: Secondary | ICD-10-CM | POA: Diagnosis not present

## 2022-07-05 DIAGNOSIS — C649 Malignant neoplasm of unspecified kidney, except renal pelvis: Secondary | ICD-10-CM | POA: Diagnosis not present

## 2022-07-05 DIAGNOSIS — B372 Candidiasis of skin and nail: Secondary | ICD-10-CM | POA: Diagnosis not present

## 2022-07-05 DIAGNOSIS — I4891 Unspecified atrial fibrillation: Secondary | ICD-10-CM | POA: Diagnosis not present

## 2022-07-06 DIAGNOSIS — M6281 Muscle weakness (generalized): Secondary | ICD-10-CM | POA: Diagnosis not present

## 2022-07-07 DIAGNOSIS — M6281 Muscle weakness (generalized): Secondary | ICD-10-CM | POA: Diagnosis not present

## 2022-07-09 DIAGNOSIS — R109 Unspecified abdominal pain: Secondary | ICD-10-CM | POA: Diagnosis not present

## 2022-07-09 DIAGNOSIS — M6281 Muscle weakness (generalized): Secondary | ICD-10-CM | POA: Diagnosis not present

## 2022-07-10 DIAGNOSIS — K219 Gastro-esophageal reflux disease without esophagitis: Secondary | ICD-10-CM | POA: Diagnosis not present

## 2022-07-10 DIAGNOSIS — G35 Multiple sclerosis: Secondary | ICD-10-CM | POA: Diagnosis not present

## 2022-07-10 DIAGNOSIS — K5900 Constipation, unspecified: Secondary | ICD-10-CM | POA: Diagnosis not present

## 2022-07-10 DIAGNOSIS — N390 Urinary tract infection, site not specified: Secondary | ICD-10-CM | POA: Diagnosis not present

## 2022-07-10 DIAGNOSIS — M6281 Muscle weakness (generalized): Secondary | ICD-10-CM | POA: Diagnosis not present

## 2022-07-10 DIAGNOSIS — E785 Hyperlipidemia, unspecified: Secondary | ICD-10-CM | POA: Diagnosis not present

## 2022-07-10 DIAGNOSIS — G4700 Insomnia, unspecified: Secondary | ICD-10-CM | POA: Diagnosis not present

## 2022-07-10 DIAGNOSIS — E039 Hypothyroidism, unspecified: Secondary | ICD-10-CM | POA: Diagnosis not present

## 2022-07-10 DIAGNOSIS — I4891 Unspecified atrial fibrillation: Secondary | ICD-10-CM | POA: Diagnosis not present

## 2022-07-10 DIAGNOSIS — R42 Dizziness and giddiness: Secondary | ICD-10-CM | POA: Diagnosis not present

## 2022-07-10 DIAGNOSIS — I509 Heart failure, unspecified: Secondary | ICD-10-CM | POA: Diagnosis not present

## 2022-07-10 DIAGNOSIS — R110 Nausea: Secondary | ICD-10-CM | POA: Diagnosis not present

## 2022-07-10 DIAGNOSIS — I951 Orthostatic hypotension: Secondary | ICD-10-CM | POA: Diagnosis not present

## 2022-07-11 ENCOUNTER — Telehealth: Payer: Self-pay | Admitting: Internal Medicine

## 2022-07-11 DIAGNOSIS — K219 Gastro-esophageal reflux disease without esophagitis: Secondary | ICD-10-CM | POA: Diagnosis not present

## 2022-07-11 DIAGNOSIS — I110 Hypertensive heart disease with heart failure: Secondary | ICD-10-CM | POA: Diagnosis not present

## 2022-07-11 DIAGNOSIS — I951 Orthostatic hypotension: Secondary | ICD-10-CM | POA: Diagnosis not present

## 2022-07-11 DIAGNOSIS — G4700 Insomnia, unspecified: Secondary | ICD-10-CM | POA: Diagnosis not present

## 2022-07-11 DIAGNOSIS — K567 Ileus, unspecified: Secondary | ICD-10-CM | POA: Diagnosis not present

## 2022-07-11 DIAGNOSIS — G35 Multiple sclerosis: Secondary | ICD-10-CM | POA: Diagnosis not present

## 2022-07-11 DIAGNOSIS — R42 Dizziness and giddiness: Secondary | ICD-10-CM | POA: Diagnosis not present

## 2022-07-11 DIAGNOSIS — B372 Candidiasis of skin and nail: Secondary | ICD-10-CM | POA: Diagnosis not present

## 2022-07-11 DIAGNOSIS — M6281 Muscle weakness (generalized): Secondary | ICD-10-CM | POA: Diagnosis not present

## 2022-07-11 DIAGNOSIS — I509 Heart failure, unspecified: Secondary | ICD-10-CM | POA: Diagnosis not present

## 2022-07-11 DIAGNOSIS — N390 Urinary tract infection, site not specified: Secondary | ICD-10-CM | POA: Diagnosis not present

## 2022-07-11 DIAGNOSIS — E785 Hyperlipidemia, unspecified: Secondary | ICD-10-CM | POA: Diagnosis not present

## 2022-07-11 NOTE — Telephone Encounter (Signed)
The labs was sent to our office and the provider at the facility his discontinue patient entresto. Please advise

## 2022-07-11 NOTE — Telephone Encounter (Signed)
Spoke with Sherlon Handing at [facility] who stated that the provider discontinue the patient's entresto because of orthostatic hypotension, dizziness, and nausea. Patient was not placed on another med. Labs (BMP, BNP, CBC) are to be faxed to our office. Patient is being treated for an ileus. Are there any orders that need to be sent to [facility]?

## 2022-07-12 DIAGNOSIS — K567 Ileus, unspecified: Secondary | ICD-10-CM | POA: Diagnosis not present

## 2022-07-12 NOTE — Telephone Encounter (Signed)
Spoke with Sherlon Handing and informed that Dr. Debara Pickett said patient is being managed by physicians there. She verbalized understanding.

## 2022-07-13 ENCOUNTER — Other Ambulatory Visit: Payer: Self-pay

## 2022-07-13 ENCOUNTER — Emergency Department (HOSPITAL_COMMUNITY): Payer: Medicare Other

## 2022-07-13 ENCOUNTER — Encounter (HOSPITAL_COMMUNITY): Payer: Self-pay

## 2022-07-13 ENCOUNTER — Inpatient Hospital Stay (HOSPITAL_COMMUNITY)
Admission: EM | Admit: 2022-07-13 | Discharge: 2022-07-17 | DRG: 641 | Disposition: A | Discharge status: Skilled Nursing Facility | Payer: Medicare Other | Source: Skilled Nursing Facility | Attending: Internal Medicine | Admitting: Internal Medicine

## 2022-07-13 DIAGNOSIS — J9611 Chronic respiratory failure with hypoxia: Secondary | ICD-10-CM | POA: Diagnosis not present

## 2022-07-13 DIAGNOSIS — R2681 Unsteadiness on feet: Secondary | ICD-10-CM | POA: Diagnosis not present

## 2022-07-13 DIAGNOSIS — I4821 Permanent atrial fibrillation: Secondary | ICD-10-CM | POA: Diagnosis present

## 2022-07-13 DIAGNOSIS — R109 Unspecified abdominal pain: Secondary | ICD-10-CM | POA: Diagnosis not present

## 2022-07-13 DIAGNOSIS — E861 Hypovolemia: Secondary | ICD-10-CM | POA: Diagnosis not present

## 2022-07-13 DIAGNOSIS — N319 Neuromuscular dysfunction of bladder, unspecified: Secondary | ICD-10-CM | POA: Diagnosis not present

## 2022-07-13 DIAGNOSIS — Z803 Family history of malignant neoplasm of breast: Secondary | ICD-10-CM

## 2022-07-13 DIAGNOSIS — Z85528 Personal history of other malignant neoplasm of kidney: Secondary | ICD-10-CM

## 2022-07-13 DIAGNOSIS — E785 Hyperlipidemia, unspecified: Secondary | ICD-10-CM | POA: Diagnosis present

## 2022-07-13 DIAGNOSIS — Z83438 Family history of other disorder of lipoprotein metabolism and other lipidemia: Secondary | ICD-10-CM

## 2022-07-13 DIAGNOSIS — F418 Other specified anxiety disorders: Secondary | ICD-10-CM | POA: Diagnosis present

## 2022-07-13 DIAGNOSIS — Z7401 Bed confinement status: Secondary | ICD-10-CM | POA: Diagnosis not present

## 2022-07-13 DIAGNOSIS — R42 Dizziness and giddiness: Secondary | ICD-10-CM | POA: Diagnosis not present

## 2022-07-13 DIAGNOSIS — Z741 Need for assistance with personal care: Secondary | ICD-10-CM | POA: Diagnosis not present

## 2022-07-13 DIAGNOSIS — G4730 Sleep apnea, unspecified: Secondary | ICD-10-CM | POA: Diagnosis present

## 2022-07-13 DIAGNOSIS — Z993 Dependence on wheelchair: Secondary | ICD-10-CM | POA: Diagnosis not present

## 2022-07-13 DIAGNOSIS — R531 Weakness: Secondary | ICD-10-CM | POA: Diagnosis not present

## 2022-07-13 DIAGNOSIS — I509 Heart failure, unspecified: Secondary | ICD-10-CM | POA: Diagnosis not present

## 2022-07-13 DIAGNOSIS — R0689 Other abnormalities of breathing: Secondary | ICD-10-CM | POA: Diagnosis not present

## 2022-07-13 DIAGNOSIS — Z978 Presence of other specified devices: Secondary | ICD-10-CM | POA: Diagnosis not present

## 2022-07-13 DIAGNOSIS — N390 Urinary tract infection, site not specified: Secondary | ICD-10-CM | POA: Diagnosis not present

## 2022-07-13 DIAGNOSIS — N3000 Acute cystitis without hematuria: Principal | ICD-10-CM

## 2022-07-13 DIAGNOSIS — C649 Malignant neoplasm of unspecified kidney, except renal pelvis: Secondary | ICD-10-CM | POA: Diagnosis not present

## 2022-07-13 DIAGNOSIS — B9620 Unspecified Escherichia coli [E. coli] as the cause of diseases classified elsewhere: Secondary | ICD-10-CM | POA: Diagnosis present

## 2022-07-13 DIAGNOSIS — R000 Tachycardia, unspecified: Secondary | ICD-10-CM | POA: Diagnosis not present

## 2022-07-13 DIAGNOSIS — I5030 Unspecified diastolic (congestive) heart failure: Secondary | ICD-10-CM | POA: Diagnosis present

## 2022-07-13 DIAGNOSIS — Z7409 Other reduced mobility: Secondary | ICD-10-CM | POA: Diagnosis not present

## 2022-07-13 DIAGNOSIS — K567 Ileus, unspecified: Secondary | ICD-10-CM | POA: Diagnosis not present

## 2022-07-13 DIAGNOSIS — I110 Hypertensive heart disease with heart failure: Secondary | ICD-10-CM | POA: Diagnosis present

## 2022-07-13 DIAGNOSIS — Z86718 Personal history of other venous thrombosis and embolism: Secondary | ICD-10-CM

## 2022-07-13 DIAGNOSIS — Z1152 Encounter for screening for COVID-19: Secondary | ICD-10-CM

## 2022-07-13 DIAGNOSIS — I9589 Other hypotension: Secondary | ICD-10-CM | POA: Diagnosis present

## 2022-07-13 DIAGNOSIS — Z8249 Family history of ischemic heart disease and other diseases of the circulatory system: Secondary | ICD-10-CM

## 2022-07-13 DIAGNOSIS — Z87891 Personal history of nicotine dependence: Secondary | ICD-10-CM

## 2022-07-13 DIAGNOSIS — R5381 Other malaise: Secondary | ICD-10-CM | POA: Diagnosis not present

## 2022-07-13 DIAGNOSIS — I517 Cardiomegaly: Secondary | ICD-10-CM | POA: Diagnosis not present

## 2022-07-13 DIAGNOSIS — I4891 Unspecified atrial fibrillation: Secondary | ICD-10-CM | POA: Diagnosis not present

## 2022-07-13 DIAGNOSIS — Z1612 Extended spectrum beta lactamase (ESBL) resistance: Secondary | ICD-10-CM | POA: Diagnosis present

## 2022-07-13 DIAGNOSIS — Z6837 Body mass index (BMI) 37.0-37.9, adult: Secondary | ICD-10-CM

## 2022-07-13 DIAGNOSIS — M6281 Muscle weakness (generalized): Secondary | ICD-10-CM | POA: Diagnosis not present

## 2022-07-13 DIAGNOSIS — R0600 Dyspnea, unspecified: Secondary | ICD-10-CM | POA: Diagnosis not present

## 2022-07-13 DIAGNOSIS — G8929 Other chronic pain: Secondary | ICD-10-CM | POA: Diagnosis present

## 2022-07-13 DIAGNOSIS — G4733 Obstructive sleep apnea (adult) (pediatric): Secondary | ICD-10-CM | POA: Diagnosis present

## 2022-07-13 DIAGNOSIS — N1330 Unspecified hydronephrosis: Secondary | ICD-10-CM | POA: Diagnosis not present

## 2022-07-13 DIAGNOSIS — Z888 Allergy status to other drugs, medicaments and biological substances status: Secondary | ICD-10-CM

## 2022-07-13 DIAGNOSIS — Z981 Arthrodesis status: Secondary | ICD-10-CM

## 2022-07-13 DIAGNOSIS — E871 Hypo-osmolality and hyponatremia: Secondary | ICD-10-CM | POA: Diagnosis not present

## 2022-07-13 DIAGNOSIS — Z8349 Family history of other endocrine, nutritional and metabolic diseases: Secondary | ICD-10-CM

## 2022-07-13 DIAGNOSIS — N136 Pyonephrosis: Secondary | ICD-10-CM | POA: Diagnosis present

## 2022-07-13 DIAGNOSIS — I5032 Chronic diastolic (congestive) heart failure: Secondary | ICD-10-CM | POA: Diagnosis present

## 2022-07-13 DIAGNOSIS — R110 Nausea: Secondary | ICD-10-CM | POA: Diagnosis not present

## 2022-07-13 DIAGNOSIS — Z79899 Other long term (current) drug therapy: Secondary | ICD-10-CM

## 2022-07-13 DIAGNOSIS — F419 Anxiety disorder, unspecified: Secondary | ICD-10-CM | POA: Diagnosis present

## 2022-07-13 DIAGNOSIS — Z7901 Long term (current) use of anticoagulants: Secondary | ICD-10-CM

## 2022-07-13 DIAGNOSIS — Z818 Family history of other mental and behavioral disorders: Secondary | ICD-10-CM

## 2022-07-13 DIAGNOSIS — G35D Multiple sclerosis, unspecified: Secondary | ICD-10-CM | POA: Diagnosis present

## 2022-07-13 DIAGNOSIS — K219 Gastro-esophageal reflux disease without esophagitis: Secondary | ICD-10-CM | POA: Diagnosis not present

## 2022-07-13 DIAGNOSIS — E240 Pituitary-dependent Cushing's disease: Secondary | ICD-10-CM | POA: Diagnosis not present

## 2022-07-13 DIAGNOSIS — G35 Multiple sclerosis: Secondary | ICD-10-CM | POA: Diagnosis present

## 2022-07-13 DIAGNOSIS — E119 Type 2 diabetes mellitus without complications: Secondary | ICD-10-CM | POA: Diagnosis not present

## 2022-07-13 DIAGNOSIS — I959 Hypotension, unspecified: Secondary | ICD-10-CM | POA: Diagnosis not present

## 2022-07-13 DIAGNOSIS — Z79890 Hormone replacement therapy (postmenopausal): Secondary | ICD-10-CM

## 2022-07-13 DIAGNOSIS — F32A Depression, unspecified: Secondary | ICD-10-CM | POA: Diagnosis present

## 2022-07-13 DIAGNOSIS — G4700 Insomnia, unspecified: Secondary | ICD-10-CM | POA: Diagnosis not present

## 2022-07-13 DIAGNOSIS — Z9989 Dependence on other enabling machines and devices: Secondary | ICD-10-CM | POA: Diagnosis not present

## 2022-07-13 DIAGNOSIS — E559 Vitamin D deficiency, unspecified: Secondary | ICD-10-CM | POA: Diagnosis not present

## 2022-07-13 DIAGNOSIS — G4731 Primary central sleep apnea: Secondary | ICD-10-CM | POA: Diagnosis not present

## 2022-07-13 DIAGNOSIS — E039 Hypothyroidism, unspecified: Secondary | ICD-10-CM | POA: Diagnosis present

## 2022-07-13 DIAGNOSIS — E869 Volume depletion, unspecified: Secondary | ICD-10-CM | POA: Diagnosis present

## 2022-07-13 DIAGNOSIS — I700 Atherosclerosis of aorta: Secondary | ICD-10-CM | POA: Diagnosis not present

## 2022-07-13 DIAGNOSIS — M1990 Unspecified osteoarthritis, unspecified site: Secondary | ICD-10-CM | POA: Diagnosis not present

## 2022-07-13 DIAGNOSIS — Z885 Allergy status to narcotic agent status: Secondary | ICD-10-CM

## 2022-07-13 LAB — COMPREHENSIVE METABOLIC PANEL
ALT: 7 U/L (ref 0–44)
AST: 20 U/L (ref 15–41)
Albumin: 3 g/dL — ABNORMAL LOW (ref 3.5–5.0)
Alkaline Phosphatase: 63 U/L (ref 38–126)
Anion gap: 11 (ref 5–15)
BUN: 13 mg/dL (ref 8–23)
CO2: 26 mmol/L (ref 22–32)
Calcium: 8.2 mg/dL — ABNORMAL LOW (ref 8.9–10.3)
Chloride: 88 mmol/L — ABNORMAL LOW (ref 98–111)
Creatinine, Ser: 0.63 mg/dL (ref 0.44–1.00)
GFR, Estimated: >60 mL/min (ref >60)
Glucose, Bld: 157 mg/dL — ABNORMAL HIGH (ref 70–99)
Potassium: 4.8 mmol/L (ref 3.5–5.1)
Sodium: 125 mmol/L — ABNORMAL LOW (ref 135–145)
Total Bilirubin: 1.1 mg/dL (ref 0.3–1.2)
Total Protein: 5.5 g/dL — ABNORMAL LOW (ref 6.5–8.1)

## 2022-07-13 LAB — URINALYSIS, ROUTINE W REFLEX MICROSCOPIC
Bilirubin Urine: NEGATIVE
Glucose, UA: NEGATIVE mg/dL
Ketones, ur: NEGATIVE mg/dL
Nitrite: NEGATIVE
Protein, ur: NEGATIVE mg/dL
Specific Gravity, Urine: 1.009 (ref 1.005–1.030)
WBC, UA: >50 WBC/hpf — ABNORMAL HIGH (ref 0–5)
pH: 6 (ref 5.0–8.0)

## 2022-07-13 LAB — PROTIME-INR
INR: 1.4 — ABNORMAL HIGH (ref 0.8–1.2)
Prothrombin Time: 17.1 seconds — ABNORMAL HIGH (ref 11.4–15.2)

## 2022-07-13 LAB — CBC WITH DIFFERENTIAL/PLATELET
Abs Immature Granulocytes: 0.01 10*3/uL (ref 0.00–0.07)
Basophils Absolute: 0.1 10*3/uL (ref 0.0–0.1)
Basophils Relative: 1 %
Eosinophils Absolute: 0.5 10*3/uL (ref 0.0–0.5)
Eosinophils Relative: 7 %
HCT: 37.9 % (ref 36.0–46.0)
Hemoglobin: 13 g/dL (ref 12.0–15.0)
Immature Granulocytes: 0 %
Lymphocytes Relative: 20 %
Lymphs Abs: 1.3 10*3/uL (ref 0.7–4.0)
MCH: 31.3 pg (ref 26.0–34.0)
MCHC: 34.3 g/dL (ref 30.0–36.0)
MCV: 91.3 fL (ref 80.0–100.0)
Monocytes Absolute: 0.9 10*3/uL (ref 0.1–1.0)
Monocytes Relative: 14 %
Neutro Abs: 3.9 10*3/uL (ref 1.7–7.7)
Neutrophils Relative %: 58 %
Platelets: 157 10*3/uL (ref 150–400)
RBC: 4.15 MIL/uL (ref 3.87–5.11)
RDW: 12.3 % (ref 11.5–15.5)
WBC: 6.7 10*3/uL (ref 4.0–10.5)
nRBC: 0 % (ref 0.0–0.2)

## 2022-07-13 LAB — MAGNESIUM: Magnesium: 1.8 mg/dL (ref 1.7–2.4)

## 2022-07-13 LAB — RESP PANEL BY RT-PCR (FLU A&B, COVID) ARPGX2
Influenza A by PCR: NEGATIVE
Influenza B by PCR: NEGATIVE
SARS Coronavirus 2 by RT PCR: NEGATIVE

## 2022-07-13 LAB — LACTIC ACID, PLASMA: Lactic Acid, Venous: 1.3 mmol/L (ref 0.5–1.9)

## 2022-07-13 MED ORDER — SODIUM CHLORIDE 0.9 % IV BOLUS
1000.0000 mL | Freq: Once | INTRAVENOUS | Status: AC
  Administered 2022-07-13: 1000 mL via INTRAVENOUS

## 2022-07-13 MED ORDER — SODIUM CHLORIDE 0.9 % IV SOLN: INTRAVENOUS | Status: DC

## 2022-07-13 MED ORDER — LACTATED RINGERS IV SOLN
INTRAVENOUS | Status: DC
  Administered 2022-07-13: 150 mL/h via INTRAVENOUS

## 2022-07-13 MED ORDER — IOHEXOL 350 MG/ML SOLN
75.0000 mL | Freq: Once | INTRAVENOUS | Status: AC | PRN
  Administered 2022-07-13: 75 mL via INTRAVENOUS

## 2022-07-13 MED ORDER — SODIUM CHLORIDE 0.9 % IV SOLN
2.0000 g | INTRAVENOUS | Status: DC
  Administered 2022-07-13 – 2022-07-14 (×2): 2 g via INTRAVENOUS
  Filled 2022-07-13 (×2): qty 20

## 2022-07-13 NOTE — Sepsis Progress Note (Signed)
Elink monitoring for the code sepsis protocol.  

## 2022-07-13 NOTE — ED Notes (Signed)
Only 1 set of blood cultures collected due to pt being difficult stick. MD [name] Heck made aware.

## 2022-07-13 NOTE — ED Notes (Signed)
Made MD [name] Heck aware of low BP; pt is getting fluids

## 2022-07-13 NOTE — ED Triage Notes (Signed)
Pt BIB [facility] from [facility] with low sodium from her recent labs. Na was 126. Pt has been weak and a little lethargic. Family states she looks more pale than usual. Pt c/o L abdominal pain from ileus.   EMS VS BP 108/56 P 110 R 18 O2 97% RA

## 2022-07-13 NOTE — H&P (Signed)
History and Physical    Toni [name]:[id number] DOB: [birth year] DOA: 07/13/2022  PCP: Jodi Marble, MD  Patient coming from: [place]  I have personally briefly reviewed patient's old medical records in [place]  Chief Complaint: Lethargy, hyponatremia  HPI: Toni [name] is a 76 y.o. female with medical history significant for PAF on Eliquis, HFpEF (EF 45-50% on 06/27/2022), multiple sclerosis with spasticity and chronic bedbound status on baclofen pump, HTN, HLD, hypothyroidism, depression/anxiety, OSA on BiPAP who presented to the ED from her nursing facility for evaluation of lethargy.  Patient presented to the ED for lethargy and lightheadedness.  She says she was diagnosed with ileus by x-ray several days ago at her nursing facility.  She was given several enemas and suppositories.  She had some loose stool afterwards but last bowel movement was formed.  Recent echocardiogram 06/22/2022 showed slightly decreased EF from prior at 45-50%.  Her cardiologist adjusted her medications, stopping diltiazem and switching to Toprol-XL 50 mg daily and starting Entresto 24/26 mg twice daily.  Her NP at her nursing facility also changed her furosemide to Demadex 30 mg twice daily about 2 weeks ago.  Patient states that she had repeat lab work done at her facility which showed low sodium and she was subsequently sent to the ED for further evaluation.  ED Course  Labs/Imaging on admission: I have personally reviewed following labs and imaging studies.  Initial vitals showed BP 92/45, pulse 89, RR 19, temp 98.6 F, SpO2 99% on room air.  Labs show WBC 6.7, hemoglobin 13.0, platelets 157,000, sodium 125, potassium 4.8, bicarb 26, BUN 13, creatinine 0.63, serum glucose 157, LFTs within normal limits, magnesium 1.8, lactic acid 1.3.  Urinalysis shows negative nitrites, large leukocytes, 21-50 RBCs, >50 WBCs, many bacteria.  Urine culture in process.   Single blood culture obtained and in process.  CT abdomen/pelvis with contrast shows stable mild bilateral hydronephrosis to the level of the bladder.  No obstructing stones visualized.  Bladder wall thickening noted.  Portable chest x-ray negative for focal consolidation, edema, effusion.  Patient was given 1 L normal saline followed by continuous infusion and IV ceftriaxone.  The hospitalist service was consulted to admit for further evaluation and management.  Review of Systems: All systems reviewed and are negative except as documented in history of present illness above.   Past Medical History:  Diagnosis Date   Abnormality of gait 03/01/2015   Anxiety    Arthritis    "knees" (01/19/2016)   Asthma    as a child    Atrial fibrillation (HCC)    Back pain    Bilateral carpal tunnel syndrome 10/04/2020   Cancer ([place])    / RENAL CELL CARCINOMA - GETS CT SCAN EVERY 6 MONTHS TO WATCH    CHF (congestive heart failure) (HCC)    Chronic pain    "nerve pain from the MS"   Complex atypical endometrial hyperplasia    Constipation    Depression    DVT (deep venous thrombosis) ([place]) 1983   "RLE; may have just been phlebitis; it was before the age of dopplers"   Dysrhythmia    afib    Edema    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' ablation of R GSV 2012   GERD (gastroesophageal reflux disease)    HLD (hyperlipidemia)    Hypertension    Hypotension    Hypothyroidism    Joint pain  Multiple sclerosis ([place])    has had this may 1989-[facility] reg doc   OSA on CPAP    bipap machine - SLEEP APNEA WORSE SINCE COVID IN 1/21 PER PATIENT SETTING HAS INCREASED FROM 9 TO 30    Osteoarthritis    PAF (paroxysmal atrial fibrillation) ([place])    on coumadin; documented on monitor 06/2010 - no longer takes per Kym Groom 02/09/21   Pneumonia 11/2011   Sleep apnea    uses sleep apnea machine    Past Surgical History:  Procedure Laterality Date   ANTERIOR CERVICAL  DECOMP/DISCECTOMY FUSION  01/2004   CARDIAC CATHETERIZATION  06/22/2010   normal coronary arteries, PAF   CATARACT EXTRACTION, BILATERAL Bilateral    CHILD BIRTHS     X2   DILATATION & CURETTAGE/HYSTEROSCOPY WITH MYOSURE N/A 01/15/2020   Procedure: DILATATION & CURETTAGE/HYSTEROSCOPY WITH MYOSURE;  Surgeon: Arvella Nigh, MD;  Location: [facility];  Service: Gynecology;  Laterality: N/A;  pt in wheelchair-has MS   DILATION AND CURETTAGE OF UTERUS     DILATION AND CURETTAGE OF UTERUS N/A 02/16/2020   Procedure: DILATATION AND CURETTAGE;  Surgeon: Lafonda Mosses, MD;  Location: [facility];  Service: Gynecology;  Laterality: N/A;  NOT GENERAL -NO INTUBATION   DNC     INFUSION PUMP IMPLANTATION  ~ 2009   baclofen infusion in lower abd   INTRATHECAL PUMP REVISION N/A 02/18/2021   Procedure: Baclofen Pump replacement with possible revision of catheter;  Surgeon: Erline Levine, MD;  Location: [facility];  Service: Neurosurgery;  Laterality: N/A;  3C/RM 20   INTRAUTERINE DEVICE (IUD) INSERTION N/A 02/16/2020   Procedure: INTRAUTERINE DEVICE (IUD) INSERTION - MIRENA;  Surgeon: Lafonda Mosses, MD;  Location: [facility];  Service: Gynecology;  Laterality: N/A;   IUD put in uterus     PAIN PUMP REVISION N/A 07/29/2014   Procedure: Baclofen pump replacement;  Surgeon: Erline Levine, MD;  Location: [facility];  Service: Neurosurgery;  Laterality: N/A;  Baclofen pump replacement   TONSILLECTOMY AND ADENOIDECTOMY     VARICOSE VEIN SURGERY  ~ 1968   VENOUS ABLATION  12/16/2010   radiofreq ablation -Dr Elisabeth Cara and Hilty   WISDOM TOOTH EXTRACTION      Social History:  reports that she quit smoking about 45 years ago. Her smoking use included cigarettes. She has a 3.50 pack-year smoking history. She has never used smokeless tobacco. She reports that she does not currently use alcohol. She reports that she does not use drugs.  Allergies  Allergen Reactions   Other Rash    Topical Surgical prep  - severe rash    Chlorhexidine Hives and Other (See Comments)    And, sores appear where applied   Hydrocodone Nausea And Vomiting and Other (See Comments)    Hydrocodone causes vomiting Hydrocodone causes vomiting   Oxycodone Nausea And Vomiting and Other (See Comments)    Oral oxycodone causes vomiting   Sertraline Hives, Swelling, Rash and Other (See Comments)    [DERM][OTHER]RASH WITH SWELLING   Macrobid [Nitrofurantoin]     Other reaction(s): 3.9.17 nausea and vomitting,   Phenol Hives   Rofecoxib     Other reaction(s): Unknown    Family History  Problem Relation Age of Onset   Depression Mother    Sudden death Mother    Anxiety disorder Mother    Coronary artery disease Father        at age 38   Hyperlipidemia Father    Thyroid disease Father  Coronary artery disease Maternal Grandmother    Depression Maternal Grandmother    Cancer Paternal Grandmother        breast   Alcoholism Son    Sleep apnea Neg Hx    Multiple sclerosis Neg Hx      Prior to Admission medications   Medication Sig Start Date End Date Taking? Authorizing Provider  acetaminophen (TYLENOL) 500 MG tablet Take 1,000 mg by mouth every 6 (six) hours as needed (fever/headache/pain).    [provider]  ALPRAZolam (XANAX) 0.5 MG tablet TAKE 1 TABLET BY MOUTH 3  TIMES DAILY AS NEEDED FOR  ANXIETY AND SLEEP Patient taking differently: Take 0.5 mg by mouth 3 (three) times daily as needed for anxiety or sleep. 10/18/20   Dohmeier, Asencion Partridge, MD  ARTIFICIAL TEARS 0.2-0.2-1 % SOLN Apply to eye. 06/09/21   [provider]  Ascorbic Acid (VITAMIN C) 1000 MG tablet Take 1,000 mg by mouth 4 (four) times daily.    [provider]  Biotin 10000 MCG TABS Take 10,000 mcg by mouth daily after lunch.    [provider]  buPROPion (WELLBUTRIN XL) 150 MG 24 hr tablet Take 1 tablet (150 mg total) by mouth daily. Patient taking differently: Take 150 mg by mouth daily. After breakfest 10/18/20   Dohmeier,  Asencion Partridge, MD  Calcium Carbonate-Vitamin D3 600-400 MG-UNIT TABS Take 1 tablet by mouth in the morning and at bedtime. (After breakfast & after lunch)    [provider]  cefTAZidime (FORTAZ IV) Inject into the vein.    [provider]  cetirizine (ZYRTEC) 10 MG tablet Take 10 mg by mouth daily as needed for allergies.     [provider]  Cholecalciferol (VITAMIN D3) 50 MCG (2000 UT) TABS Take 2,000 Units by mouth in the morning and at bedtime. (After breakfast & after lunch)    [provider]  Coenzyme Q10 (COQ10 PO) Take by mouth daily in the afternoon.    [provider]  corticotropin (ACTHAR) 80 UNIT/ML injectable gel Inject 1 mL (80 Units total) into the skin every other day for 10 days. May give additional doses PRN. 05/16/22 05/26/22  Dohmeier, Asencion Partridge, MD  diclofenac Sodium (VOLTAREN) 1 % GEL Apply 2 g topically 3 (three) times daily as needed. Patient taking differently: Apply 2 g topically 3 (three) times daily as needed (pain (applied to bilateral shoulders)). 12/08/20   Mcarthur Rossetti, MD  docusate sodium (COLACE) 100 MG capsule Take 1 capsule (100 mg total) by mouth daily. Patient taking differently: Take 100 mg by mouth 2 (two) times daily. 08/12/16   Rai, Ripudeep K, MD  ELIQUIS 5 MG TABS tablet Take 5 mg by mouth 2 (two) times daily. 05/23/22   [provider]  escitalopram (LEXAPRO) 10 MG tablet Take 1 tablet (10 mg total) by mouth daily. Patient taking differently: Take 10 mg by mouth at bedtime. 10/18/20   Dohmeier, Asencion Partridge, MD  famotidine (PEPCID) 20 MG tablet Take 20 mg by mouth daily.    [provider]  gabapentin (NEURONTIN) 300 MG capsule Mrs. Vanderweide will take 300 mg of gabapentin at 6 AM, 10 AM, 2 PM and 6 PM and 600 mg at 10 PM by mouth. 03/07/21   Dohmeier, Asencion Partridge, MD  gabapentin (NEURONTIN) 600 MG tablet Take 600 mg by mouth at bedtime.    [provider]  HEMORRHOIDAL 0.25-14-74.9 % rectal ointment  Place rectally. 05/19/22   [provider]  lactobacillus acidophilus (BACID) TABS tablet Take  1 tablet by mouth 2 (two) times daily as needed.    [provider]  lactulose (CHRONULAC) 10 GM/15ML solution as needed. 10/22/21   [provider]  levothyroxine (SYNTHROID, LEVOTHROID) 75 MCG tablet Take 75 mcg by mouth daily before breakfast. (0500)    [provider]  melatonin 5 MG TABS Take 5 mg by mouth at bedtime.    [provider]  Menthol-Zinc Oxide (REMEDY CALAZIME) 0.4-20.5 % PSTE Apply 1 application topically 3 (three) times daily as needed (buttocks/sacrum with incontinences care episodes).    [provider]  metolazone (ZAROXOLYN) 2.5 MG tablet Take 2.5 mg by mouth daily. 09/09/21   [provider]  metoprolol succinate (TOPROL-XL) 50 MG 24 hr tablet Take 1 tablet (50 mg total) by mouth daily. Take with or immediately following a meal. 06/27/22 06/22/23  Hilty, Nadean Corwin, MD  Multiple Vitamin (MULTIVITAMIN WITH MINERALS) TABS tablet Take 1 tablet by mouth daily after lunch.    [provider]  neomycin-bacitracin-polymyxin (NEOSPORIN) ointment Apply 1 application topically 2 (two) times daily as needed for wound care (applied to nose (wear mask rubs)).    [provider]  NON FORMULARY ASV breathing machine is to be used while napping & at bedtime. Fill canister up to max line with distilled water and turn concentrator to 5LPM.    [provider]  [facility] powder Apply topically. 06/09/21   [provider]  omeprazole (PRILOSEC) 40 MG capsule Take 1 capsule (40 mg total) by mouth every morning. 11/16/21   Levin Erp, PA  ondansetron (ZOFRAN-ODT) 4 MG disintegrating tablet Take by mouth. 06/01/22   [provider]  oxybutynin (DITROPAN) 5 MG tablet Take 5 mg by mouth 3 (three) times daily as needed for bladder spasms. 02/09/20   [provider]  phenazopyridine (PYRIDIUM)  100 MG tablet Take 100 mg by mouth 3 (three) times daily. 08/15/21   [provider]  potassium chloride SA (KLOR-CON) 20 MEQ tablet Take 2 tablets (40 mEq total) by mouth daily. Patient taking differently: Take 20 mEq by mouth 2 (two) times daily. (After lunch & after dinner) 01/03/21   Hilty, Nadean Corwin, MD  prochlorperazine (COMPAZINE) 5 MG tablet Take 5 mg by mouth daily as needed for nausea or vomiting.    [provider]  Prucalopride Succinate (MOTEGRITY) 2 MG TABS Take 1 tablet (2 mg total) by mouth daily. 01/24/22   Thornton Park, MD  psyllium (METAMUCIL SMOOTH TEXTURE) 28 % packet Take 1 packet by mouth at bedtime.    [provider]  sacubitril-valsartan (ENTRESTO) 24-26 MG Take 1 tablet by mouth 2 (two) times daily. 06/27/22   Hilty, Nadean Corwin, MD  SENNA-TIME 8.6 MG tablet Take 2 tablets by mouth daily. 05/24/22   [provider]  simvastatin (ZOCOR) 10 MG tablet Take 10 mg by mouth at bedtime. 03/01/18   [provider]  SYRINGE/NEEDLE, DISP, 1 ML (BD LUER-LOK SYRINGE) 25G X 5/8" 1 ML MISC Use as directed to inject  [facility]har 06/15/15   Dohmeier, Asencion Partridge, MD  torsemide (DEMADEX) 10 MG tablet Take 30 mg by mouth 2 (two) times daily.    [provider]  zinc oxide 20 % ointment Apply 1 application topically as needed for irritation (apply labia every shift as needed for redness).    [provider]    Physical Exam: Vitals:   07/14/22 0020 07/14/22 0030 07/14/22 0040 07/14/22 0050  BP: (!) 89/44 (!) 90/47 (!) 90/54 (!) 102/54  Pulse: (!) 42 71 67 (!) 58  Resp:      Temp:      TempSrc:      SpO2: 99% 99% 100% (!) 89%  Weight:      Height:       Constitutional: Resting supine in bed, NAD, calm, comfortable Eyes: EOMI, lids and conjunctivae normal ENMT: Mucous membranes are moist. Posterior pharynx clear of any exudate or lesions.Normal dentition.  Neck: normal, supple, no masses. Respiratory: clear to auscultation  anteriorly. Normal respiratory effort. No accessory muscle use.  Cardiovascular: Irregularly irregular, no murmurs / rubs / gallops.  Trace bilateral lower extremity edema. 2+ pedal pulses. Abdomen: no tenderness, no masses palpated.  Musculoskeletal: no clubbing / cyanosis. No joint deformity upper and lower extremities. Good ROM, no contractures. Normal muscle tone.  Skin: no rashes, lesions, ulcers. No induration Neurologic: CN 2-12 grossly intact. Sensation intact. Strength 5/5 in all 4.  Spasm left foot. Psychiatric: Normal judgment and insight. Alert and oriented x 3. Normal mood.   EKG: Personally reviewed. Atrial fibrillation, rate 69, low voltage.  Similar to prior.  Assessment/Plan Principal Problem:   Hyponatremia Active Problems:   UTI (urinary tract infection)   Permanent atrial fibrillation (HCC)   (HFpEF) heart failure with preserved ejection fraction (HCC)   Hypothyroidism   HLD (hyperlipidemia)   Depression with anxiety   Obstructive sleep apnea treated with bilevel positive airway pressure (BiPAP)   Multiple sclerosis ([place])   Toni [name] is a 76 y.o. female with medical history significant for PAF on Eliquis, HFpEF (EF 45-50% on 06/27/2022), multiple sclerosis with spasticity and chronic bedbound status on baclofen pump, HTN, HLD, hypothyroidism, depression/anxiety, OSA on BiPAP who is admitted with hyponatremia.  Assessment and Plan: * Hyponatremia Sodium 125 on admission.  Likely hyponatremic from volume depletion related to diuresis and GI losses from bowel regimen.  She has received 2 L NS in the ED. -Repeat labs in a.m. -Hold torsemide  UTI (urinary tract infection) Reports urinary frequency.  Continue IV ceftriaxone.  Follow urine culture.  Permanent atrial fibrillation (HCC) Remains in atrial fibrillation with controlled rate. -Continue Eliquis -Toprol-XL on hold for now due to hypotension, resume as able  (HFpEF) heart failure with preserved  ejection fraction (HCC) EF 45-50% on 06/22/2022. -Holding diuretics as above -Holding Entresto, Toprol-XL due to hypotension -Strict I/O's, daily weights  Multiple sclerosis (HCC) Continue gabapentin.  Obstructive sleep apnea treated with bilevel positive airway pressure (BiPAP) Continue BiPAP nightly.  Depression with anxiety Continue home Xanax as needed with hold parameters.  Hold Wellbutrin, Lexapro for now.  HLD (hyperlipidemia) Continue simvastatin.  Hypothyroidism Continue Synthroid.  DVT prophylaxis:  apixaban (ELIQUIS) tablet 5 mg   Code Status: Full code, confirmed with patient on admission Family Communication: Spouse at bedside [place]position Plan: [place] and likely discharge to same facility pending clinical progress Consults called: None Severity of Illness: The appropriate patient status for this patient is INPATIENT. Inpatient status is judged to be reasonable and necessary in order to provide the required intensity of service to ensure the patient's safety. The patient's presenting symptoms, physical exam findings, and initial radiographic and laboratory data in the context of their chronic comorbidities is felt to place them at high risk for further clinical deterioration. Furthermore, it is not anticipated that the patient will be medically stable for discharge from the hospital within 2 midnights of admission.   * I certify that at the point of admission it is my clinical judgment that  the patient will require inpatient hospital care spanning beyond 2 midnights from the point of admission due to high intensity of service, high risk for further deterioration and high frequency of surveillance required.Zada Finders MD [facility]  If 7PM-7AM, please contact night-coverage [email or web address]  07/14/2022, 1:42 AM

## 2022-07-13 NOTE — ED Notes (Signed)
No scanner available to scan meds at this time.

## 2022-07-13 NOTE — ED Provider Notes (Signed)
[facility] EMERGENCY DEPARTMENT Provider Note   CSN: [id number] Arrival date & time: 07/13/22  1919     History  Chief Complaint  Patient presents with   Weakness    Toni [name] is a 76 y.o. female.   Weakness    This patient is a very pleasant 76 year old female who unfortunately has fairly severe multiple sclerosis, she is bedbound and living at [facility] facility.  She has been there for the better part of a year.  She reports that over the last week she has had increasing abdominal distention persistent nausea, inability to eat or drink very much and has been diagnosed with an ileus by an x-ray at that facility.  She has had several enemas, she is not having much in the way of output, she continues to feel worse and now has been diagnosed as hyponatremic based on labs that were performed.  She was sent over here today because of persistent and worsening fatigue and weakness  Home Medications Prior to Admission medications   Medication Sig Start Date End Date Taking? Authorizing Provider  acetaminophen (TYLENOL) 500 MG tablet Take 1,000 mg by mouth every 6 (six) hours as needed (fever/headache/pain).    [provider]  ALPRAZolam (XANAX) 0.5 MG tablet TAKE 1 TABLET BY MOUTH 3  TIMES DAILY AS NEEDED FOR  ANXIETY AND SLEEP Patient taking differently: Take 0.5 mg by mouth 3 (three) times daily as needed for anxiety or sleep. 10/18/20   Dohmeier, Asencion Partridge, MD  ARTIFICIAL TEARS 0.2-0.2-1 % SOLN Apply to eye. 06/09/21   [provider]  Ascorbic Acid (VITAMIN C) 1000 MG tablet Take 1,000 mg by mouth 4 (four) times daily.    [provider]  Biotin 10000 MCG TABS Take 10,000 mcg by mouth daily after lunch.    [provider]  buPROPion (WELLBUTRIN XL) 150 MG 24 hr tablet Take 1 tablet (150 mg total) by mouth daily. Patient taking differently: Take 150 mg by mouth daily. After breakfest 10/18/20   Dohmeier, Asencion Partridge,  MD  Calcium Carbonate-Vitamin D3 600-400 MG-UNIT TABS Take 1 tablet by mouth in the morning and at bedtime. (After breakfast & after lunch)    [provider]  cefTAZidime (FORTAZ IV) Inject into the vein.    [provider]  cetirizine (ZYRTEC) 10 MG tablet Take 10 mg by mouth daily as needed for allergies.     [provider]  Cholecalciferol (VITAMIN D3) 50 MCG (2000 UT) TABS Take 2,000 Units by mouth in the morning and at bedtime. (After breakfast & after lunch)    [provider]  Coenzyme Q10 (COQ10 PO) Take by mouth daily in the afternoon.    [provider]  corticotropin (ACTHAR) 80 UNIT/ML injectable gel Inject 1 mL (80 Units total) into the skin every other day for 10 days. May give additional doses PRN. 05/16/22 05/26/22  Dohmeier, Asencion Partridge, MD  diclofenac Sodium (VOLTAREN) 1 % GEL Apply 2 g topically 3 (three) times daily as needed. Patient taking differently: Apply 2 g topically 3 (three) times daily as needed (pain (applied to bilateral shoulders)). 12/08/20   Mcarthur Rossetti, MD  docusate sodium (COLACE) 100 MG capsule Take 1 capsule (100 mg total) by mouth daily. Patient taking differently: Take 100 mg by mouth 2 (two) times daily. 08/12/16   Rai, Ripudeep K, MD  ELIQUIS 5 MG TABS tablet Take 5 mg by mouth 2 (two) times daily. 05/23/22   [provider]  escitalopram (LEXAPRO) 10 MG tablet Take 1 tablet (10 mg total) by mouth daily. Patient taking differently: Take 10 mg by mouth at bedtime. 10/18/20   Dohmeier, Asencion Partridge, MD  famotidine (PEPCID) 20 MG tablet Take 20 mg by mouth daily.    [provider]  gabapentin (NEURONTIN) 300 MG capsule Mrs. Rotter will take 300 mg of gabapentin at 6 AM, 10 AM, 2 PM and 6 PM and 600 mg at 10 PM by mouth. 03/07/21   Dohmeier, Asencion Partridge, MD  gabapentin (NEURONTIN) 600 MG tablet Take 600 mg by mouth at bedtime.    [provider]  HEMORRHOIDAL 0.25-14-74.9 % rectal ointment Place  rectally. 05/19/22   [provider]  lactobacillus acidophilus (BACID) TABS tablet Take 1 tablet by mouth 2 (two) times daily as needed.    [provider]  lactulose (CHRONULAC) 10 GM/15ML solution as needed. 10/22/21   [provider]  levothyroxine (SYNTHROID, LEVOTHROID) 75 MCG tablet Take 75 mcg by mouth daily before breakfast. (0500)    [provider]  melatonin 5 MG TABS Take 5 mg by mouth at bedtime.    [provider]  Menthol-Zinc Oxide (REMEDY CALAZIME) 0.4-20.5 % PSTE Apply 1 application topically 3 (three) times daily as needed (buttocks/sacrum with incontinences care episodes).    [provider]  metolazone (ZAROXOLYN) 2.5 MG tablet Take 2.5 mg by mouth daily. 09/09/21   [provider]  metoprolol succinate (TOPROL-XL) 50 MG 24 hr tablet Take 1 tablet (50 mg total) by mouth daily. Take with or immediately following a meal. 06/27/22 06/22/23  Hilty, Nadean Corwin, MD  Multiple Vitamin (MULTIVITAMIN WITH MINERALS) TABS tablet Take 1 tablet by mouth daily after lunch.    [provider]  neomycin-bacitracin-polymyxin (NEOSPORIN) ointment Apply 1 application topically 2 (two) times daily as needed for wound care (applied to nose (wear mask rubs)).    [provider]  NON FORMULARY ASV breathing machine is to be used while napping & at bedtime. Fill canister up to max line with distilled water and turn concentrator to 5LPM.    [provider]  [facility] powder Apply topically. 06/09/21   [provider]  omeprazole (PRILOSEC) 40 MG capsule Take 1 capsule (40 mg total) by mouth every morning. 11/16/21   Levin Erp, PA  ondansetron (ZOFRAN-ODT) 4 MG disintegrating tablet Take by mouth. 06/01/22   [provider]  oxybutynin (DITROPAN) 5 MG tablet Take 5 mg by mouth 3 (three) times daily as needed for bladder spasms. 02/09/20   [provider]  phenazopyridine (PYRIDIUM) 100 MG  tablet Take 100 mg by mouth 3 (three) times daily. 08/15/21   [provider]  potassium chloride SA (KLOR-CON) 20 MEQ tablet Take 2 tablets (40 mEq total) by mouth daily. Patient taking differently: Take 20 mEq by mouth 2 (two) times daily. (After lunch & after dinner) 01/03/21   Hilty, Nadean Corwin, MD  prochlorperazine (COMPAZINE) 5 MG tablet Take 5 mg by mouth daily as needed for nausea or vomiting.    [provider]  Prucalopride Succinate (MOTEGRITY) 2 MG TABS Take 1 tablet (2 mg total) by mouth daily. 01/24/22   Thornton Park, MD  psyllium (METAMUCIL SMOOTH TEXTURE) 28 % packet Take 1 packet by mouth at bedtime.    [provider]  sacubitril-valsartan (ENTRESTO) 24-26 MG Take 1 tablet by mouth 2 (two) times daily. 06/27/22   Hilty, Nadean Corwin, MD  SENNA-TIME 8.6 MG tablet Take 2 tablets by mouth daily.  05/24/22   [provider]  simvastatin (ZOCOR) 10 MG tablet Take 10 mg by mouth at bedtime. 03/01/18   [provider]  SYRINGE/NEEDLE, DISP, 1 ML (BD LUER-LOK SYRINGE) 25G X 5/8" 1 ML MISC Use as directed to inject  Acthar 06/15/15   Dohmeier, Asencion Partridge, MD  torsemide (DEMADEX) 10 MG tablet Take 30 mg by mouth 2 (two) times daily.    [provider]  zinc oxide 20 % ointment Apply 1 application topically as needed for irritation (apply labia every shift as needed for redness).    [provider]      Allergies    Other, Chlorhexidine, Hydrocodone, Oxycodone, Sertraline, Macrobid [nitrofurantoin], Phenol, and Rofecoxib    Review of Systems   Review of Systems  Neurological:  Positive for weakness.  All other systems reviewed and are negative.   Physical Exam Updated Vital Signs SpO2 97%  Physical Exam Vitals and nursing note reviewed.  Constitutional:      General: She is not in acute distress.    Appearance: She is well-developed.  HENT:     Head: Normocephalic and atraumatic.     Mouth/Throat:     Pharynx: No  oropharyngeal exudate.  Eyes:     General: No scleral icterus.       Right eye: No discharge.        Left eye: No discharge.     Conjunctiva/sclera: Conjunctivae normal.     Pupils: Pupils are equal, round, and reactive to light.  Neck:     Thyroid: No thyromegaly.     Vascular: No JVD.  Cardiovascular:     Rate and Rhythm: Tachycardia present. Rhythm irregular.     Heart sounds: Normal heart sounds. No murmur heard.    No friction rub. No gallop.  Pulmonary:     Effort: Pulmonary effort is normal. No respiratory distress.     Breath sounds: Normal breath sounds. No wheezing or rales.  Abdominal:     General: Bowel sounds are normal. There is distension.     Palpations: Abdomen is soft. There is no mass.     Tenderness: There is no abdominal tenderness.  Musculoskeletal:        General: No tenderness. Normal range of motion.     Cervical back: Normal range of motion and neck supple.     Right lower leg: Edema present.     Left lower leg: Edema present.  Lymphadenopathy:     Cervical: No cervical adenopathy.  Skin:    General: Skin is warm and dry.     Findings: No erythema or rash.  Neurological:     Mental Status: She is alert.     Coordination: Coordination normal.  Psychiatric:        Behavior: Behavior normal.     ED Results / Procedures / Treatments   Labs (all labs ordered are listed, but only abnormal results are displayed) Labs Reviewed - No data to display  EKG None  Radiology No results found.  Procedures Procedures    Medications Ordered in ED Medications - No data to display  ED Course/ Medical Decision Making/ A&P                           Medical Decision Making Amount and/or Complexity of Data Reviewed Labs: ordered. Radiology: ordered. ECG/medicine tests: ordered.  Risk Prescription drug management. Decision regarding hospitalization.   This patient presents to the ED for concern of  abdominal distention with nausea, hyponatremia,  this involves an extensive number of treatment options, and is a complaint that carries with it a high risk of complications and morbidity.  The differential diagnosis includes ileus or obstruction, severe constipation or obstipation, significant electrolyte abnormalities   Co morbidities that complicate the patient evaluation  Multiple sclerosis, chronic atrial fibrillation, she is anticoagulated on Eliquis   Additional history obtained:  Additional history obtained from electronic medical record as well as the patient's nursing home records which accompany her External records from outside source obtained and reviewed including x-rays that were done, medications that she takes, prior evaluations in the hospital and admissions   Lab Tests:  I Ordered, and personally interpreted labs.  The pertinent results include:  UTI prsent, no leukocytosis, renal function preserved - and Na of 125   Imaging Studies ordered:  I ordered imaging studies including CT abd - neg for GI findings.  I independently visualized and interpreted imaging which showed no acute findings. I agree with the radiologist interpretation   Cardiac Monitoring: / EKG:  The patient was maintained on a cardiac monitor.  I personally viewed and interpreted the cardiac monitored which showed an underlying rhythm of: afib   Consultations Obtained:  I requested consultation with the hospitalist,  and discussed lab and imaging findings as well as pertinent plan - they recommend: admission   Problem List / ED Course / Critical interventions / Medication management  Given antibiotics IV fluids I ordered medication including antibiotics and IV fluids for cystitis dehydration hyponatremia Reevaluation of the patient after these medicines showed that the patient slight improvement I have reviewed the patients home medicines and have made adjustments as needed   Social Determinants of Health:  Single patient,  essentially bedbound   Test / Admission - Considered:  We will admit to the hospital         Final Clinical Impression(s) / ED Diagnoses Final diagnoses:  Acute cystitis without hematuria  Hyponatremia    Rx / DC Orders ED Discharge Orders     None         Noemi Chapel, MD 07/13/22 2327

## 2022-07-14 ENCOUNTER — Encounter (HOSPITAL_COMMUNITY): Payer: Self-pay | Admitting: Internal Medicine

## 2022-07-14 DIAGNOSIS — F419 Anxiety disorder, unspecified: Secondary | ICD-10-CM | POA: Diagnosis present

## 2022-07-14 DIAGNOSIS — N319 Neuromuscular dysfunction of bladder, unspecified: Secondary | ICD-10-CM | POA: Diagnosis not present

## 2022-07-14 DIAGNOSIS — N390 Urinary tract infection, site not specified: Secondary | ICD-10-CM | POA: Diagnosis not present

## 2022-07-14 DIAGNOSIS — Z1152 Encounter for screening for COVID-19: Secondary | ICD-10-CM | POA: Diagnosis not present

## 2022-07-14 DIAGNOSIS — N136 Pyonephrosis: Secondary | ICD-10-CM | POA: Diagnosis present

## 2022-07-14 DIAGNOSIS — Z7401 Bed confinement status: Secondary | ICD-10-CM | POA: Diagnosis not present

## 2022-07-14 DIAGNOSIS — Z85528 Personal history of other malignant neoplasm of kidney: Secondary | ICD-10-CM | POA: Diagnosis not present

## 2022-07-14 DIAGNOSIS — E871 Hypo-osmolality and hyponatremia: Secondary | ICD-10-CM | POA: Diagnosis present

## 2022-07-14 DIAGNOSIS — Z993 Dependence on wheelchair: Secondary | ICD-10-CM | POA: Diagnosis not present

## 2022-07-14 DIAGNOSIS — I110 Hypertensive heart disease with heart failure: Secondary | ICD-10-CM | POA: Diagnosis present

## 2022-07-14 DIAGNOSIS — M1990 Unspecified osteoarthritis, unspecified site: Secondary | ICD-10-CM | POA: Diagnosis not present

## 2022-07-14 DIAGNOSIS — R0600 Dyspnea, unspecified: Secondary | ICD-10-CM | POA: Diagnosis not present

## 2022-07-14 DIAGNOSIS — Z79899 Other long term (current) drug therapy: Secondary | ICD-10-CM | POA: Diagnosis not present

## 2022-07-14 DIAGNOSIS — G4733 Obstructive sleep apnea (adult) (pediatric): Secondary | ICD-10-CM | POA: Diagnosis present

## 2022-07-14 DIAGNOSIS — Z741 Need for assistance with personal care: Secondary | ICD-10-CM | POA: Diagnosis not present

## 2022-07-14 DIAGNOSIS — N3000 Acute cystitis without hematuria: Secondary | ICD-10-CM

## 2022-07-14 DIAGNOSIS — I509 Heart failure, unspecified: Secondary | ICD-10-CM | POA: Diagnosis not present

## 2022-07-14 DIAGNOSIS — G35 Multiple sclerosis: Secondary | ICD-10-CM | POA: Diagnosis present

## 2022-07-14 DIAGNOSIS — I5030 Unspecified diastolic (congestive) heart failure: Secondary | ICD-10-CM | POA: Diagnosis present

## 2022-07-14 DIAGNOSIS — I4891 Unspecified atrial fibrillation: Secondary | ICD-10-CM | POA: Diagnosis not present

## 2022-07-14 DIAGNOSIS — E039 Hypothyroidism, unspecified: Secondary | ICD-10-CM

## 2022-07-14 DIAGNOSIS — F32A Depression, unspecified: Secondary | ICD-10-CM | POA: Diagnosis present

## 2022-07-14 DIAGNOSIS — R531 Weakness: Secondary | ICD-10-CM | POA: Diagnosis not present

## 2022-07-14 DIAGNOSIS — E785 Hyperlipidemia, unspecified: Secondary | ICD-10-CM | POA: Diagnosis present

## 2022-07-14 DIAGNOSIS — I5032 Chronic diastolic (congestive) heart failure: Secondary | ICD-10-CM | POA: Diagnosis present

## 2022-07-14 DIAGNOSIS — E559 Vitamin D deficiency, unspecified: Secondary | ICD-10-CM | POA: Diagnosis not present

## 2022-07-14 DIAGNOSIS — I9589 Other hypotension: Secondary | ICD-10-CM | POA: Diagnosis present

## 2022-07-14 DIAGNOSIS — G4731 Primary central sleep apnea: Secondary | ICD-10-CM | POA: Diagnosis not present

## 2022-07-14 DIAGNOSIS — B9620 Unspecified Escherichia coli [E. coli] as the cause of diseases classified elsewhere: Secondary | ICD-10-CM | POA: Diagnosis present

## 2022-07-14 DIAGNOSIS — Z7409 Other reduced mobility: Secondary | ICD-10-CM | POA: Diagnosis not present

## 2022-07-14 DIAGNOSIS — I4821 Permanent atrial fibrillation: Secondary | ICD-10-CM | POA: Diagnosis present

## 2022-07-14 DIAGNOSIS — G4700 Insomnia, unspecified: Secondary | ICD-10-CM | POA: Diagnosis not present

## 2022-07-14 DIAGNOSIS — J9611 Chronic respiratory failure with hypoxia: Secondary | ICD-10-CM | POA: Diagnosis not present

## 2022-07-14 DIAGNOSIS — R2681 Unsteadiness on feet: Secondary | ICD-10-CM | POA: Diagnosis not present

## 2022-07-14 DIAGNOSIS — E869 Volume depletion, unspecified: Secondary | ICD-10-CM | POA: Diagnosis present

## 2022-07-14 DIAGNOSIS — Z1612 Extended spectrum beta lactamase (ESBL) resistance: Secondary | ICD-10-CM | POA: Diagnosis present

## 2022-07-14 DIAGNOSIS — M6281 Muscle weakness (generalized): Secondary | ICD-10-CM | POA: Diagnosis not present

## 2022-07-14 DIAGNOSIS — E119 Type 2 diabetes mellitus without complications: Secondary | ICD-10-CM | POA: Diagnosis not present

## 2022-07-14 DIAGNOSIS — K219 Gastro-esophageal reflux disease without esophagitis: Secondary | ICD-10-CM | POA: Diagnosis not present

## 2022-07-14 DIAGNOSIS — Z87891 Personal history of nicotine dependence: Secondary | ICD-10-CM | POA: Diagnosis not present

## 2022-07-14 DIAGNOSIS — Z86718 Personal history of other venous thrombosis and embolism: Secondary | ICD-10-CM | POA: Diagnosis not present

## 2022-07-14 DIAGNOSIS — E240 Pituitary-dependent Cushing's disease: Secondary | ICD-10-CM | POA: Diagnosis not present

## 2022-07-14 DIAGNOSIS — C649 Malignant neoplasm of unspecified kidney, except renal pelvis: Secondary | ICD-10-CM | POA: Diagnosis not present

## 2022-07-14 DIAGNOSIS — Z9989 Dependence on other enabling machines and devices: Secondary | ICD-10-CM | POA: Diagnosis not present

## 2022-07-14 DIAGNOSIS — Z978 Presence of other specified devices: Secondary | ICD-10-CM | POA: Diagnosis not present

## 2022-07-14 DIAGNOSIS — Z7901 Long term (current) use of anticoagulants: Secondary | ICD-10-CM | POA: Diagnosis not present

## 2022-07-14 LAB — CBC
HCT: 35.9 % — ABNORMAL LOW (ref 36.0–46.0)
Hemoglobin: 12.2 g/dL (ref 12.0–15.0)
MCH: 31.2 pg (ref 26.0–34.0)
MCHC: 34 g/dL (ref 30.0–36.0)
MCV: 91.8 fL (ref 80.0–100.0)
Platelets: 158 10*3/uL (ref 150–400)
RBC: 3.91 MIL/uL (ref 3.87–5.11)
RDW: 12.4 % (ref 11.5–15.5)
WBC: 7.5 10*3/uL (ref 4.0–10.5)
nRBC: 0 % (ref 0.0–0.2)

## 2022-07-14 LAB — BASIC METABOLIC PANEL
Anion gap: 11 (ref 5–15)
BUN: 9 mg/dL (ref 8–23)
CO2: 29 mmol/L (ref 22–32)
Calcium: 8.7 mg/dL — ABNORMAL LOW (ref 8.9–10.3)
Chloride: 99 mmol/L (ref 98–111)
Creatinine, Ser: 0.48 mg/dL (ref 0.44–1.00)
GFR, Estimated: >60 mL/min (ref >60)
Glucose, Bld: 75 mg/dL (ref 70–99)
Potassium: 4 mmol/L (ref 3.5–5.1)
Sodium: 139 mmol/L (ref 135–145)

## 2022-07-14 MED ORDER — DOCUSATE SODIUM 100 MG PO CAPS: 100.0000 mg | ORAL_CAPSULE | Freq: Two times a day (BID) | ORAL | Status: DC

## 2022-07-14 MED ORDER — SIMVASTATIN 20 MG PO TABS
10.0000 mg | ORAL_TABLET | Freq: Every day | ORAL | Status: DC
  Administered 2022-07-14 – 2022-07-16 (×3): 10 mg via ORAL
  Filled 2022-07-14 (×3): qty 1

## 2022-07-14 MED ORDER — ACETAMINOPHEN 650 MG RE SUPP: 650.0000 mg | Freq: Four times a day (QID) | RECTAL | Status: DC | PRN

## 2022-07-14 MED ORDER — MIDODRINE HCL 5 MG PO TABS
5.0000 mg | ORAL_TABLET | Freq: Three times a day (TID) | ORAL | Status: DC
  Administered 2022-07-14 – 2022-07-15 (×4): 5 mg via ORAL
  Filled 2022-07-14 (×4): qty 1

## 2022-07-14 MED ORDER — METOPROLOL SUCCINATE ER 25 MG PO TB24
12.5000 mg | ORAL_TABLET | Freq: Every day | ORAL | Status: DC
  Administered 2022-07-14 – 2022-07-17 (×4): 12.5 mg via ORAL
  Filled 2022-07-14 (×4): qty 1

## 2022-07-14 MED ORDER — SENNOSIDES-DOCUSATE SODIUM 8.6-50 MG PO TABS: 1.0000 | ORAL_TABLET | Freq: Every evening | ORAL | Status: DC | PRN

## 2022-07-14 MED ORDER — SENNOSIDES-DOCUSATE SODIUM 8.6-50 MG PO TABS
2.0000 | ORAL_TABLET | Freq: Every day | ORAL | Status: DC
  Administered 2022-07-14 – 2022-07-16 (×3): 2 via ORAL
  Filled 2022-07-14 (×3): qty 2

## 2022-07-14 MED ORDER — SODIUM CHLORIDE 0.9% FLUSH
3.0000 mL | Freq: Two times a day (BID) | INTRAVENOUS | Status: DC
  Administered 2022-07-14 – 2022-07-16 (×4): 3 mL via INTRAVENOUS

## 2022-07-14 MED ORDER — POLYETHYLENE GLYCOL 3350 17 G PO PACK
17.0000 g | PACK | Freq: Every day | ORAL | Status: DC
  Administered 2022-07-14 – 2022-07-16 (×3): 17 g via ORAL
  Filled 2022-07-14 (×3): qty 1

## 2022-07-14 MED ORDER — ACETAMINOPHEN 325 MG PO TABS
650.0000 mg | ORAL_TABLET | Freq: Four times a day (QID) | ORAL | Status: DC | PRN
  Administered 2022-07-15 – 2022-07-16 (×3): 650 mg via ORAL
  Filled 2022-07-14 (×3): qty 2

## 2022-07-14 MED ORDER — LEVOTHYROXINE SODIUM 75 MCG PO TABS
75.0000 ug | ORAL_TABLET | Freq: Every day | ORAL | Status: DC
  Administered 2022-07-14 – 2022-07-17 (×4): 75 ug via ORAL
  Filled 2022-07-14 (×4): qty 1

## 2022-07-14 MED ORDER — MELATONIN 5 MG PO TABS
5.0000 mg | ORAL_TABLET | Freq: Every day | ORAL | Status: DC
  Administered 2022-07-14 – 2022-07-16 (×3): 5 mg via ORAL
  Filled 2022-07-14 (×3): qty 1

## 2022-07-14 MED ORDER — GABAPENTIN 300 MG PO CAPS
300.0000 mg | ORAL_CAPSULE | Freq: Four times a day (QID) | ORAL | Status: DC
  Administered 2022-07-14 – 2022-07-17 (×14): 300 mg via ORAL
  Filled 2022-07-14 (×14): qty 1

## 2022-07-14 MED ORDER — ALPRAZOLAM 0.5 MG PO TABS
0.5000 mg | ORAL_TABLET | Freq: Three times a day (TID) | ORAL | Status: DC | PRN
  Administered 2022-07-15 – 2022-07-16 (×2): 0.5 mg via ORAL
  Filled 2022-07-14 (×2): qty 1

## 2022-07-14 MED ORDER — ONDANSETRON HCL 4 MG/2ML IJ SOLN: 4.0000 mg | Freq: Four times a day (QID) | INTRAMUSCULAR | Status: DC | PRN

## 2022-07-14 MED ORDER — ESCITALOPRAM OXALATE 10 MG PO TABS
10.0000 mg | ORAL_TABLET | Freq: Every day | ORAL | Status: DC
  Administered 2022-07-14 – 2022-07-16 (×3): 10 mg via ORAL
  Filled 2022-07-14 (×3): qty 1

## 2022-07-14 MED ORDER — SODIUM CHLORIDE 0.9 % IV SOLN: INTRAVENOUS | Status: DC

## 2022-07-14 MED ORDER — BUPROPION HCL ER (XL) 150 MG PO TB24
150.0000 mg | ORAL_TABLET | Freq: Every day | ORAL | Status: DC
  Administered 2022-07-14 – 2022-07-17 (×4): 150 mg via ORAL
  Filled 2022-07-14 (×4): qty 1

## 2022-07-14 MED ORDER — APIXABAN 5 MG PO TABS
5.0000 mg | ORAL_TABLET | Freq: Two times a day (BID) | ORAL | Status: DC
  Administered 2022-07-14 – 2022-07-17 (×7): 5 mg via ORAL
  Filled 2022-07-14 (×7): qty 1

## 2022-07-14 MED ORDER — TORSEMIDE 20 MG PO TABS
20.0000 mg | ORAL_TABLET | Freq: Every day | ORAL | Status: DC
  Administered 2022-07-14 – 2022-07-15 (×2): 20 mg via ORAL
  Filled 2022-07-14 (×2): qty 1

## 2022-07-14 MED ORDER — OXYBUTYNIN CHLORIDE 5 MG PO TABS: 5.0000 mg | ORAL_TABLET | Freq: Three times a day (TID) | ORAL | Status: DC | PRN

## 2022-07-14 MED ORDER — ONDANSETRON HCL 4 MG PO TABS
4.0000 mg | ORAL_TABLET | Freq: Four times a day (QID) | ORAL | Status: DC | PRN
  Administered 2022-07-14 – 2022-07-15 (×2): 4 mg via ORAL
  Filled 2022-07-14 (×2): qty 1

## 2022-07-14 MED ORDER — PHENAZOPYRIDINE HCL 100 MG PO TABS: 100.0000 mg | ORAL_TABLET | Freq: Three times a day (TID) | ORAL | Status: DC | PRN

## 2022-07-14 NOTE — Assessment & Plan Note (Signed)
Sodium 125 on admission.  Likely hyponatremic from volume depletion related to diuresis and GI losses from bowel regimen.  She has received 2 L NS in the ED. -Repeat labs in a.m. -Hold torsemide

## 2022-07-14 NOTE — Assessment & Plan Note (Signed)
Continue home Xanax as needed with hold parameters.  Hold Wellbutrin, Lexapro for now.

## 2022-07-14 NOTE — ED Notes (Signed)
Dr. Oren Binet aware of pt BP orders obtained.

## 2022-07-14 NOTE — Assessment & Plan Note (Signed)
Continue simvastatin. 

## 2022-07-14 NOTE — ED Notes (Signed)
Toni Pronto, MD notified via page regarding pts hx of sleep apnea and use of BiPAP at night. Awaiting provider response at this time.

## 2022-07-14 NOTE — Assessment & Plan Note (Signed)
Continue gabapentin.

## 2022-07-14 NOTE — ED Notes (Signed)
[facility] admitting team paged regarding pts hx of sleep apnea and use of BiPAP. Awaiting provider response at this time.

## 2022-07-14 NOTE — Hospital Course (Signed)
Toni [name] is a 76 y.o. female with medical history significant for PAF on Eliquis, HFpEF (EF 45-50% on 06/27/2022), multiple sclerosis with spasticity and chronic bedbound status on baclofen pump, HTN, HLD, hypothyroidism, depression/anxiety, OSA on BiPAP who is admitted with hyponatremia.

## 2022-07-14 NOTE — Assessment & Plan Note (Addendum)
Reports urinary frequency.  Continue IV ceftriaxone.  Follow urine culture.  Sepsis not present on admission.

## 2022-07-14 NOTE — Assessment & Plan Note (Signed)
Continue BiPAP nightly. 

## 2022-07-14 NOTE — Assessment & Plan Note (Signed)
Remains in atrial fibrillation with controlled rate. -Continue Eliquis -Toprol-XL on hold for now due to hypotension, resume as able

## 2022-07-14 NOTE — Assessment & Plan Note (Signed)
EF 45-50% on 06/22/2022. -Holding diuretics as above -Holding Entresto, Toprol-XL due to hypotension -Strict I/O's, daily weights

## 2022-07-14 NOTE — ED Notes (Signed)
ED TO INPATIENT HANDOFF REPORT  ED Nurse Name and Phone #: Asaph Serena RN [phone] Name/Age/Gender Toni [name] 76 y.o. female Room/Bed: 039C/039C  Code Status   Code Status: Full Code  Home/SNF/Other Skilled nursing facility Patient oriented to: self, place, time, and situation Is this baseline? Yes   Triage Complete: Triage complete  Chief Complaint Hyponatremia [E87.1]  Triage Note Pt BIB [facility] from [facility] with low sodium from her recent labs. Na was 126. Pt has been weak and a little lethargic. Family states she looks more pale than usual. Pt c/o L abdominal pain from ileus.   EMS VS BP 108/56 P 110 R 18 O2 97% RA   Allergies Allergies  Allergen Reactions   Other Rash    Topical Surgical prep  - severe rash   Chlorhexidine Hives and Other (See Comments)    And, sores appear where applied   Hydrocodone Nausea And Vomiting and Other (See Comments)    Hydrocodone causes vomiting    Oxycodone Nausea And Vomiting and Other (See Comments)    Oral oxycodone causes vomiting   Sertraline Hives, Swelling, Rash and Other (See Comments)    [DERM][OTHER]RASH WITH SWELLING   Macrobid [Nitrofurantoin]     Other reaction(s): 3.9.17 nausea and vomitting,  NOT DOCUMENTED ON THE MAR   Phenol Hives    NOT DOCUMENTED ON THE MAR   Rofecoxib     Other reaction(s): Unknown  NOT DOCUMENTED ON THE MAR    Level of Care/Admitting Diagnosis ED Disposition     ED Disposition  Admit   Condition  --   [place]: [place] [[id number]]  Level of Care: Telemetry Medical [[id number]]  May admit patient to [facility] or [facility] if equivalent level of care is available:: Yes  Covid Evaluation: Confirmed COVID Negative  Diagnosis: Hyponatremia [[id number]]  Admitting Physician: [name] [[id number]]  Attending Physician: [name] [[id number]]  Bed request comments: perfer [place]  Certification:: I certify this patient will need  inpatient services for at least 2 midnights          B Medical/Surgery History Past Medical History:  Diagnosis Date   Abnormality of gait 03/01/2015   Anxiety    Arthritis    "knees" (01/19/2016)   Asthma    as a child    Atrial fibrillation ([place])    Back pain    Bilateral carpal tunnel syndrome 10/04/2020   Cancer ([place])    / RENAL CELL CARCINOMA - GETS CT SCAN EVERY 6 MONTHS TO WATCH    CHF (congestive heart failure) (H[facility])    Chronic pain    "nerve pain from the MS"   Complex atypical endometrial hyperplasia    Constipation    Depression    DVT (deep venous thrombosis) ([place]) 1983   "RLE; may have just been phlebitis; it was before the age of dopplers"   Dysrhythmia    afib    Edema    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' ablation of R GSV 2012   GERD (gastroesophageal reflux disease)    HLD (hyperlipidemia)    Hypertension    Hypotension    Hypothyroidism    Joint pain    Multiple sclerosis ([place])    has had this may 1989-[place] reg doc   OSA on CPAP    bipap machine - SLEEP APNEA WORSE SINCE COVID IN 1/21 PER PATIENT SETTING HAS INCREASED  FROM 9 TO 30    Osteoarthritis    PAF (paroxysmal atrial fibrillation) ([place])    on coumadin; documented on monitor 06/2010 - no longer takes per Kym Groom 02/09/21   Pneumonia 11/2011   Sleep apnea    uses sleep apnea machine   Past Surgical History:  Procedure Laterality Date   ANTERIOR CERVICAL DECOMP/DISCECTOMY FUSION  01/2004   CARDIAC CATHETERIZATION  06/22/2010   normal coronary arteries, PAF   CATARACT EXTRACTION, BILATERAL Bilateral    CHILD BIRTHS     X2   DILATATION & CURETTAGE/HYSTEROSCOPY WITH MYOSURE N/A 01/15/2020   Procedure: DILATATION & CURETTAGE/HYSTEROSCOPY WITH MYOSURE;  Surgeon: Arvella Nigh, MD;  Location: [facility];  Service: Gynecology;  Laterality: N/A;  pt in wheelchair-has MS   DILATION AND CURETTAGE OF UTERUS     DILATION AND CURETTAGE OF UTERUS N/A 02/16/2020    Procedure: DILATATION AND CURETTAGE;  Surgeon: Lafonda Mosses, MD;  Location: [facility];  Service: Gynecology;  Laterality: N/A;  NOT GENERAL -NO INTUBATION   DNC     INFUSION PUMP IMPLANTATION  ~ 2009   baclofen infusion in lower abd   INTRATHECAL PUMP REVISION N/A 02/18/2021   Procedure: Baclofen Pump replacement with possible revision of catheter;  Surgeon: Erline Levine, MD;  Location: [facility];  Service: Neurosurgery;  Laterality: N/A;  3C/RM 20   INTRAUTERINE DEVICE (IUD) INSERTION N/A 02/16/2020   Procedure: INTRAUTERINE DEVICE (IUD) INSERTION - MIRENA;  Surgeon: Lafonda Mosses, MD;  Location: [facility];  Service: Gynecology;  Laterality: N/A;   IUD put in uterus     PAIN PUMP REVISION N/A 07/29/2014   Procedure: Baclofen pump replacement;  Surgeon: Erline Levine, MD;  Location: [facility];  Service: Neurosurgery;  Laterality: N/A;  Baclofen pump replacement   TONSILLECTOMY AND ADENOIDECTOMY     VARICOSE VEIN SURGERY  ~ 1968   VENOUS ABLATION  12/16/2010   radiofreq ablation -Dr Elisabeth Cara and [place] EXTRACTION       A IV Location/Drains/Wounds Patient Lines/Drains/Airways Status     Active Line/Drains/Airways     Name Placement date Placement time Site Days   Peripheral IV 07/13/22 22 G Posterior;Right Forearm 07/13/22  1924  Forearm  1   Peripheral IV 07/13/22 20 G 2.5" Anterior;Left Forearm 07/13/22  2326  Forearm  1   Incision (Closed) 02/18/21 Flank Right 02/18/21  1704  -- 511   Wound / Incision (Open or Dehisced) 01/03/21 (MASD) Moisture Associated Skin Damage Sacrum Medial 01/03/21  2215  Sacrum  557            Intake/Output Last 24 hours  Intake/Output Summary (Last 24 hours) at 07/14/2022 1504 Last data filed at 07/14/2022 1221 Gross per 24 hour  Intake 1489.8 ml  Output 3600 ml  Net -2110.2 ml    Labs/Imaging Results for orders placed or performed during the hospital encounter of 07/13/22 (from the past 48 hour(s))  Comprehensive  metabolic panel     Status: Abnormal   Collection Time: 07/13/22  8:30 PM  Result Value Ref Range   Sodium 125 (L) 135 - 145 mmol/L   Potassium 4.8 3.5 - 5.1 mmol/L    Comment: HEMOLYSIS AT THIS LEVEL MAY AFFECT RESULT   Chloride 88 (L) 98 - 111 mmol/L   CO2 26 22 - 32 mmol/L   Glucose, Bld 157 (H) 70 - 99 mg/dL    Comment: Glucose reference range applies only to samples taken after fasting for at  least 8 hours.   BUN 13 8 - 23 mg/dL   Creatinine, Ser 0.63 0.44 - 1.00 mg/dL   Calcium 8.2 (L) 8.9 - 10.3 mg/dL   Total Protein 5.5 (L) 6.5 - 8.1 g/dL   Albumin 3.0 (L) 3.5 - 5.0 g/dL   AST 20 15 - 41 U/L    Comment: HEMOLYSIS AT THIS LEVEL MAY AFFECT RESULT   ALT 7 0 - 44 U/L    Comment: HEMOLYSIS AT THIS LEVEL MAY AFFECT RESULT   Alkaline Phosphatase 63 38 - 126 U/L   Total Bilirubin 1.1 0.3 - 1.2 mg/dL    Comment: HEMOLYSIS AT THIS LEVEL MAY AFFECT RESULT   GFR, Estimated >60 >60 mL/min    Comment: (NOTE) Calculated using the CKD-EPI Creatinine Equation (2021)    Anion gap 11 5 - 15    Comment: Performed at [facility], [place] [address] [place], [place] [address]  CBC with Differential     Status: None   Collection Time: 07/13/22  8:30 PM  Result Value Ref Range   WBC 6.7 4.0 - 10.5 K/uL   RBC 4.15 3.87 - 5.11 MIL/uL   Hemoglobin 13.0 12.0 - 15.0 g/dL   HCT 37.9 36.0 - 46.0 %   MCV 91.3 80.0 - 100.0 fL   MCH 31.3 26.0 - 34.0 pg   MCHC 34.3 30.0 - 36.0 g/dL   RDW 12.3 11.5 - 15.5 %   Platelets 157 150 - 400 K/uL   nRBC 0.0 0.0 - 0.2 %   Neutrophils Relative % 58 %   Neutro Abs 3.9 1.7 - 7.7 K/uL   Lymphocytes Relative 20 %   Lymphs Abs 1.3 0.7 - 4.0 K/uL   Monocytes Relative 14 %   Monocytes Absolute 0.9 0.1 - 1.0 K/uL   Eosinophils Relative 7 %   Eosinophils Absolute 0.5 0.0 - 0.5 K/uL   Basophils Relative 1 %   Basophils Absolute 0.1 0.0 - 0.1 K/uL   Immature Granulocytes 0 %   Abs Immature Granulocytes 0.01 0.00 - 0.07 K/uL    Comment: Performed at [facility], [place] [address], [place], [place] [address]  Protime-INR     Status: Abnormal   Collection Time: 07/13/22  8:30 PM  Result Value Ref Range   Prothrombin Time 17.1 (H) 11.4 - 15.2 seconds   INR 1.4 (H) 0.8 - 1.2    Comment: (NOTE) INR goal varies based on device and disease states. Performed at [facility], [place] [address] [place], [place]  [address]   Magnesium     Status: None   Collection Time: 07/13/22  8:30 PM  Result Value Ref Range   Magnesium 1.8 1.7 - 2.4 mg/dL    Comment: Performed at [facility] [address], [place], [place] [address]  Resp Panel by RT-PCR (Flu A&B, Covid) Anterior Nasal Swab     Status: None   Collection Time: 07/13/22  9:41 PM   Specimen: Anterior Nasal Swab  Result Value Ref Range   SARS Coronavirus 2 by RT PCR NEGATIVE NEGATIVE    Comment: (NOTE) SARS-CoV-2 target nucleic acids are NOT DETECTED.  The SARS-CoV-2 RNA is generally detectable in upper respiratory specimens during the acute phase of infection. The lowest concentration of SARS-CoV-2 viral copies this assay can detect is 138 copies/mL. A negative result does not preclude SARS-Cov-2 infection and should not be used as the sole basis for treatment or other patient management decisions. A  negative result may occur with  improper specimen collection/handling, submission of specimen other than nasopharyngeal swab, presence of viral mutation(s) within the areas targeted by this assay, and inadequate number of viral copies(<138 copies/mL). A negative result must be combined with clinical observations, patient history, and epidemiological information. The expected result is Negative.  Fact Sheet for Patients:  EntrepreneurPulse.com.au  Fact Sheet for Healthcare Providers:  IncredibleEmployment.be  This test is no t yet approved or cleared by the [place] FDA and  has been authorized for detection and/or diagnosis of SARS-CoV-2  by FDA under an Emergency Use Authorization (EUA). This EUA will remain  in effect (meaning this test can be used) for the duration of the COVID-19 declaration under Section 564(b)(1) of the Act, 21 U.S.C.section 360bbb-3(b)(1), unless the authorization is terminated  or revoked sooner.       Influenza A by PCR NEGATIVE NEGATIVE   Influenza B by PCR NEGATIVE NEGATIVE    Comment: (NOTE) The Xpert Xpress SARS-CoV-2/FLU/RSV plus assay is intended as an aid in the diagnosis of influenza from Nasopharyngeal swab specimens and should not be used as a sole basis for treatment. Nasal washings and aspirates are unacceptable for Xpert Xpress SARS-CoV-2/FLU/RSV testing.  Fact Sheet for Patients: EntrepreneurPulse.com.au  Fact Sheet for Healthcare Providers: IncredibleEmployment.be  This test is not yet approved or cleared by the [place] FDA and has been authorized for detection and/or diagnosis of SARS-CoV-2 by FDA under an Emergency Use Authorization (EUA). This EUA will remain in effect (meaning this test can be used) for the duration of the COVID-19 declaration under Section 564(b)(1) of the Act, 21 U.S.C. section 360bbb-3(b)(1), unless the authorization is terminated or revoked.  Performed at [facility], [place] [address], [place], [place] [address]   Urinalysis, Routine w reflex microscopic Urine, Clean Catch     Status: Abnormal   Collection Time: 07/13/22  9:48 PM  Result Value Ref Range   Color, Urine YELLOW YELLOW   APPearance CLOUDY (A) CLEAR   Specific Gravity, Urine 1.009 1.005 - 1.030   pH 6.0 5.0 - 8.0   Glucose, UA NEGATIVE NEGATIVE mg/dL   Hgb urine dipstick SMALL (A) NEGATIVE   Bilirubin Urine NEGATIVE NEGATIVE   Ketones, ur NEGATIVE NEGATIVE mg/dL   Protein, ur NEGATIVE NEGATIVE mg/dL   Nitrite NEGATIVE NEGATIVE   Leukocytes,Ua LARGE (A) NEGATIVE   RBC / HPF 21-50 0 - 5 RBC/hpf   WBC, UA >50 (H) 0 - 5 WBC/hpf    Bacteria, UA MANY (A) NONE SEEN   Squamous Epithelial / LPF 0-5 0 - 5   Mucus PRESENT     Comment: Performed at [facility], [place] [address] [place], [place] [address]  Blood Culture (routine x 2)     Status: None (Preliminary result)   Collection Time: 07/13/22 10:28 PM   Specimen: BLOOD RIGHT WRIST  Result Value Ref Range   Specimen Description BLOOD RIGHT WRIST    Special Requests      BOTTLES DRAWN AEROBIC ONLY Blood Culture results may not be optimal due to an inadequate volume of blood received in culture bottles   Culture      NO GROWTH < 12 HOURS Performed at [facility] [address], [place], [place] [address]    Report Status PENDING   Lactic acid, plasma     Status: None   Collection Time: 07/13/22 10:30 PM  Result Value Ref Range   Lactic Acid, Venous 1.3 0.5 -  1.9 mmol/L    Comment: Performed at [facility], [place] [address], [place], [place] [address]  CBC     Status: Abnormal   Collection Time: 07/14/22  4:50 AM  Result Value Ref Range   WBC 7.5 4.0 - 10.5 K/uL   RBC 3.91 3.87 - 5.11 MIL/uL   Hemoglobin 12.2 12.0 - 15.0 g/dL   HCT 35.9 (L) 36.0 - 46.0 %   MCV 91.8 80.0 - 100.0 fL   MCH 31.2 26.0 - 34.0 pg   MCHC 34.0 30.0 - 36.0 g/dL   RDW 12.4 11.5 - 15.5 %   Platelets 158 150 - 400 K/uL   nRBC 0.0 0.0 - 0.2 %    Comment: Performed at [facility], [place] [address] [place], [place] [address]  Basic metabolic panel     Status: Abnormal   Collection Time: 07/14/22  4:50 AM  Result Value Ref Range   Sodium 139 135 - 145 mmol/L   Potassium 4.0 3.5 - 5.1 mmol/L   Chloride 99 98 - 111 mmol/L   CO2 29 22 - 32 mmol/L   Glucose, Bld 75 70 - 99 mg/dL    Comment: Glucose reference range applies only to samples taken after fasting for at least 8 hours.   BUN 9 8 - 23 mg/dL   Creatinine, Ser 0.48 0.44 - 1.00 mg/dL   Calcium 8.7 (L) 8.9 - 10.3 mg/dL   GFR, Estimated >60 >60 mL/min    Comment: (NOTE) Calculated using the CKD-EPI Creatinine  Equation (2021)    Anion gap 11 5 - 15    Comment: Performed at [facility] [address], [place], [place] [address]   *Note: Due to a large number of results and/or encounters for the requested time period, some results have not been displayed. A complete set of results can be found in Results Review.   DG Chest Port 1 View  Result Date: 07/13/2022 CLINICAL DATA:  Possible sepsis. EXAM: PORTABLE CHEST 1 VIEW COMPARISON:  03/07/2022. FINDINGS: The heart is enlarged the mediastinal contour is stable. Atherosclerotic calcification of the aorta is noted. No consolidation, effusion, or pneumothorax. Cervical spinal fusion hardware is noted. IMPRESSION: No active disease. Electronically Signed   By: Brett Fairy M.D.   On: 07/13/2022 22:10   CT ABDOMEN PELVIS W CONTRAST  Result Date: 07/13/2022 CLINICAL DATA:  Abdominal pain, acute, nonlocalized abdominal pain EXAM: CT ABDOMEN AND PELVIS WITH CONTRAST TECHNIQUE: Multidetector CT imaging of the abdomen and pelvis was performed using the standard protocol following bolus administration of intravenous contrast. RADIATION DOSE REDUCTION: This exam was performed according to the departmental dose-optimization program which includes automated exposure control, adjustment of the mA and/or kV according to patient size and/or use of iterative reconstruction technique. CONTRAST:  61m OMNIPAQUE IOHEXOL 350 MG/ML SOLN COMPARISON:  03/08/2019 FINDINGS: Lower chest: No acute abnormality Hepatobiliary: No focal hepatic abnormality. Gallbladder unremarkable. Pancreas: No focal abnormality or ductal dilatation. Spleen: No focal abnormality.  Normal size. Adrenals/Urinary Tract: Adrenal glands normal. Mild bilateral hydronephrosis, similar to prior study. No visible renal or ureteral stones. Bladder wall thickening similar to prior study. Stomach/Bowel: Stomach, large and small bowel grossly unremarkable. Vascular/Lymphatic: No evidence of aneurysm or  adenopathy. Scattered aortic calcifications. Reproductive: Uterus and adnexa unremarkable. No mass. IUD within the uterus. Other: No free fluid or free air. Musculoskeletal: No acute bony abnormality. IMPRESSION: Stable mild bilateral hydronephrosis to the level of the bladder. No visible obstructing stones. Continued  bladder wall thickening which may be related to cystitis. Electronically Signed   By: Rolm Baptise M.D.   On: 07/13/2022 21:13    Pending Labs Unresulted Labs (From admission, onward)     Start     Ordered   07/13/22 2141  Blood Culture (routine x 2)  (Septic presentation on arrival (screening labs, nursing and treatment orders for obvious sepsis))  BLOOD CULTURE X 2,   STAT      07/13/22 2141   07/13/22 2125  Urine Culture  Once,   URGENT       Question:  Indication  Answer:  Dysuria   07/13/22 2124            Vitals/Pain Today's Vitals   07/14/22 1330 07/14/22 1400 07/14/22 1401 07/14/22 1500  BP: (!) 95/50 (!) 98/47  (!) 95/45  Pulse: 64 67 89 72  Resp: 11 (!) '9 11 11  '$ Temp:      TempSrc:      SpO2: 99% 100% 99% 98%  Weight:      Height:      PainSc:        Isolation Precautions No active isolations  Medications Medications  cefTRIAXone (ROCEPHIN) 2 g in sodium chloride 0.9 % 100 mL IVPB (0 g Intravenous Stopped 07/13/22 2350)  sodium chloride flush (NS) 0.9 % injection 3 mL (3 mLs Intravenous Given 07/14/22 0957)  acetaminophen (TYLENOL) tablet 650 mg (has no administration in time range)    Or  acetaminophen (TYLENOL) suppository 650 mg (has no administration in time range)  ondansetron (ZOFRAN) tablet 4 mg (has no administration in time range)    Or  ondansetron (ZOFRAN) injection 4 mg (has no administration in time range)  ALPRAZolam (XANAX) tablet 0.5 mg (has no administration in time range)  apixaban (ELIQUIS) tablet 5 mg (5 mg Oral Given 07/14/22 0950)  gabapentin (NEURONTIN) capsule 300 mg (300 mg Oral Given 07/14/22 0952)  levothyroxine  (SYNTHROID) tablet 75 mcg (75 mcg Oral Given 07/14/22 0603)  simvastatin (ZOCOR) tablet 10 mg (has no administration in time range)  metoprolol succinate (TOPROL-XL) 24 hr tablet 12.5 mg (12.5 mg Oral Given 07/14/22 0949)  torsemide (DEMADEX) tablet 20 mg (20 mg Oral Given 07/14/22 0950)  senna-docusate (Senokot-S) tablet 2 tablet (has no administration in time range)  polyethylene glycol (MIRALAX / GLYCOLAX) packet 17 g (17 g Oral Given 07/14/22 0947)  midodrine (PROAMATINE) tablet 5 mg (5 mg Oral Given 07/14/22 0950)  buPROPion (WELLBUTRIN XL) 24 hr tablet 150 mg (150 mg Oral Given 07/14/22 1109)  escitalopram (LEXAPRO) tablet 10 mg (has no administration in time range)  melatonin tablet 5 mg (has no administration in time range)  oxybutynin (DITROPAN) tablet 5 mg (has no administration in time range)  phenazopyridine (PYRIDIUM) tablet 100 mg (has no administration in time range)  iohexol (OMNIPAQUE) 350 MG/ML injection 75 mL (75 mLs Intravenous Contrast Given 07/13/22 2052)  sodium chloride 0.9 % bolus 1,000 mL (0 mLs Intravenous Stopped 07/14/22 0326)    Mobility non-ambulatory Moderate fall risk   Focused Assessments Cardiac Assessment Handoff:  Cardiac Rhythm: Atrial fibrillation Lab Results  Component Value Date   CKTOTAL 20 03/10/2011   CKMB 1.6 03/10/2011   TROPONINI 0.05 (HH) 08/06/2016   Lab Results  Component Value Date   DDIMER  06/18/2010    0.27        AT THE INHOUSE ESTABLISHED CUTOFF VALUE OF 0.48 ug/mL FEU, THIS ASSAY HAS BEEN DOCUMENTED IN THE LITERATURE TO HAVE A  SENSITIVITY AND NEGATIVE PREDICTIVE VALUE OF AT LEAST 98 TO 99%.  THE TEST RESULT SHOULD BE CORRELATED WITH AN ASSESSMENT OF THE CLINICAL PROBABILITY OF DVT / VTE.   Does the Patient currently have chest pain? No   , Pulmonary Assessment Handoff:  Lung sounds:   O2 Device: Room Air      R Recommendations: See Admitting Provider Note  Report given to:   Additional Notes:

## 2022-07-14 NOTE — Progress Notes (Addendum)
PROGRESS NOTE        PATIENT DETAILS Name: Toni [name] Age: 76 y.o. Sex: female Date of Birth: 1945-10-09 Admit Date: 07/13/2022 Admitting Physician Evalee Mutton Kristeen Mans, MD [id number], Brandt Loosen, MD  Brief Summary: Patient is a 76 y.o.  female with history of multiple sclerosis-bed to wheelchair bound, chronic HFpEF, PAF on Eliquis, OSA on BiPAP-who was sent from SNF for lethargy/hyponatremia.  Apparently patient developed ileus several days ago-was placed on liquid diet, and was given numerous enemas/suppositories with profuse bowel movement.  Significant events: 11/23>> admit to [facility].  Hyponatremia/lethargy.  Significant studies: 11/23>> CT abdomen/pelvis: No SBO/ileus, mild bilateral hydronephrosis. 11/23>> CXR: No PNA  Significant microbiology data: 11/23>> COVID/influenza PCR: Negative 11/23>> blood culture: No growth 11/23 urine culture: Pending  Procedures: None  Consults: None  Subjective: Lying comfortably in bed-denies any chest pain or shortness of breath.  Feels much better than on initial presentation.  Last BM yesterday.  Objective: Vitals: Blood pressure (!) 96/51, pulse 87, temperature 97.9 F (36.6 C), temperature source Oral, resp. rate (!) 22, height '5\' 3"'$  (1.6 m), weight 87.5 kg, SpO2 97 %.   Exam: Gen Exam:Alert awake-not in any distress HEENT:atraumatic, normocephalic Chest: B/L clear to auscultation anteriorly CVS:S1S2 regular Abdomen:soft non tender, non distended Extremities: Trace edema Neurology: Significant lower extremity weakness-but symmetrical. Skin: no rash  Pertinent Labs/Radiology:    Latest Ref Rng & Units 07/14/2022    4:50 AM 07/13/2022    8:30 PM 05/29/2022   11:20 AM  CBC  WBC 4.0 - 10.5 K/uL 7.5  6.7  7.4   Hemoglobin 12.0 - 15.0 g/dL 12.2  13.0  12.6   Hematocrit 36.0 - 46.0 % 35.9  37.9  37.2   Platelets 150 - 400 K/uL 158  157  258.0     Lab Results  Component Value Date   NA 139  07/14/2022   K 4.0 07/14/2022   CL 99 07/14/2022   CO2 29 07/14/2022      Assessment/Plan: Hyponatremia  Suspect this was acute-and was due to patient being on predominantly clear liquid diet/bowel purge with profuse watery stools. Sodium levels have normalized following IV fluid resuscitation (was hypotensive overnight as well) Follow electrolytes closely.  Complicated UTI Acknowledges frequency/dysuria Remains on Rocephin Await cultures.  Recent history of ileus Seems to have resolved CT imaging negative Maintain on bowel regimen with MiraLAX/senna  Chronic HFpEF No evidence of volume overload Per patient-she was recently placed on Entresto which she did not tolerate and this was stopped at [facility]. BP soft-will resume Toprol/Demadex at significant lower dose than baseline.  Permanent atrial fibrillation Rate controlled Resuming Toprol at a significantly lower dose due to soft BP Remains on Eliquis Monitor in telemetry.  Hypotension chronic issue-asymptomatic Given bedbound status-suspect she runs soft at baseline. Reportedly she was on midodrine previously Restart midodrine Prior ACTH stim test 2021 negative  HLD Continue statin  Hypothyroidism Continue Synthroid  Multiple sclerosis Baclofen pump in place Continue gabapentin Follow-up with neurology postdischarge. Mostly bed to wheelchair bound, but able to sit/stand Apparently gets PT/OT at SNF.  Depression/anxiety Resume Wellbutrin/Lexapro Continue Xanax  OSA BiPAP nightly  Obesity Estimated body mass index is 34.17 kg/m as calculated from the following:   Height as of this encounter: '5\' 3"'$  (1.6 m).   Weight as of this encounter: 87.5 kg.   Code status:  Code Status: Full Code   DVT Prophylaxis: apixaban (ELIQUIS) tablet 5 mg     Family Communication: Spouse at bedside   Disposition Plan: Status is: Inpatient Remains inpatient appropriate because: Hyponatremia/UTI-not yet stable for  discharge.   Planned Discharge Destination:Skilled nursing facility hopefully in the next day or so.   Diet: Diet Order             Diet Heart Room service appropriate? Yes; Fluid consistency: Thin  Diet effective now                     Antimicrobial agents: Anti-infectives (From admission, onward)    Start     Dose/Rate Route Frequency Ordered Stop   07/13/22 2145  cefTRIAXone (ROCEPHIN) 2 g in sodium chloride 0.9 % 100 mL IVPB        2 g 200 mL/hr over 30 Minutes Intravenous Every 24 hours 07/13/22 2141 07/20/22 2144        MEDICATIONS: Scheduled Meds:  apixaban  5 mg Oral BID   gabapentin  300 mg Oral QID   levothyroxine  75 mcg Oral Q0600   metoprolol succinate  12.5 mg Oral Daily   midodrine  5 mg Oral TID WC   polyethylene glycol  17 g Oral Daily   senna-docusate  2 tablet Oral QHS   simvastatin  10 mg Oral QHS   sodium chloride flush  3 mL Intravenous Q12H   torsemide  20 mg Oral Daily   Continuous Infusions:  cefTRIAXone (ROCEPHIN)  IV Stopped (07/13/22 2350)   PRN Meds:.acetaminophen **OR** acetaminophen, ALPRAZolam, ondansetron **OR** ondansetron (ZOFRAN) IV   I have personally reviewed following labs and imaging studies  LABORATORY DATA: CBC: Recent Labs  Lab 07/13/22 2030 07/14/22 0450  WBC 6.7 7.5  NEUTROABS 3.9  --   HGB 13.0 12.2  HCT 37.9 35.9*  MCV 91.3 91.8  PLT 157 109    Basic Metabolic Panel: Recent Labs  Lab 07/13/22 2030 07/14/22 0450  NA 125* 139  K 4.8 4.0  CL 88* 99  CO2 26 29  GLUCOSE 157* 75  BUN 13 9  CREATININE 0.63 0.48  CALCIUM 8.2* 8.7*  MG 1.8  --     GFR: Estimated Creatinine Clearance: 63.7 mL/min (by C-G formula based on SCr of 0.48 mg/dL).  Liver Function Tests: Recent Labs  Lab 07/13/22 2030  AST 20  ALT 7  ALKPHOS 63  BILITOT 1.1  PROT 5.5*  ALBUMIN 3.0*   No results for input(s): "LIPASE", "AMYLASE" in the last 168 hours. No results for input(s): "AMMONIA" in the last 168  hours.  Coagulation Profile: Recent Labs  Lab 07/13/22 2030  INR 1.4*    Cardiac Enzymes: No results for input(s): "CKTOTAL", "CKMB", "CKMBINDEX", "TROPONINI" in the last 168 hours.  BNP (last 3 results) No results for input(s): "PROBNP" in the last 8760 hours.  Lipid Profile: No results for input(s): "CHOL", "HDL", "LDLCALC", "TRIG", "CHOLHDL", "LDLDIRECT" in the last 72 hours.  Thyroid Function Tests: No results for input(s): "TSH", "T4TOTAL", "FREET4", "T3FREE", "THYROIDAB" in the last 72 hours.  Anemia Panel: No results for input(s): "VITAMINB12", "FOLATE", "FERRITIN", "TIBC", "IRON", "RETICCTPCT" in the last 72 hours.  Urine analysis:    Component Value Date/Time   COLORURINE YELLOW 07/13/2022 2148   APPEARANCEUR CLOUDY (A) 07/13/2022 2148   APPEARANCEUR Clear 12/18/2014 1156   LABSPEC 1.009 07/13/2022 2148   PHURINE 6.0 07/13/2022 2148   GLUCOSEU NEGATIVE 07/13/2022 2148   HGBUR SMALL (A)  07/13/2022 2148   BILIRUBINUR NEGATIVE 07/13/2022 2148   BILIRUBINUR Negative 12/18/2014 [place] 07/13/2022 2148   PROTEINUR NEGATIVE 07/13/2022 2148   UROBILINOGEN 0.2 08/05/2014 0951   NITRITE NEGATIVE 07/13/2022 2148   LEUKOCYTESUR LARGE (A) 07/13/2022 2148    Sepsis Labs: Lactic Acid, Venous    Component Value Date/Time   LATICACIDVEN 1.3 07/13/2022 2230    MICROBIOLOGY: Recent Results (from the past 240 hour(s))  Resp Panel by RT-PCR (Flu A&B, Covid) Anterior Nasal Swab     Status: None   Collection Time: 07/13/22  9:41 PM   Specimen: Anterior Nasal Swab  Result Value Ref Range Status   SARS Coronavirus 2 by RT PCR NEGATIVE NEGATIVE Final    Comment: (NOTE) SARS-CoV-2 target nucleic acids are NOT DETECTED.  The SARS-CoV-2 RNA is generally detectable in upper respiratory specimens during the acute phase of infection. The lowest concentration of SARS-CoV-2 viral copies this assay can detect is 138 copies/mL. A negative result does not preclude  SARS-Cov-2 infection and should not be used as the sole basis for treatment or other patient management decisions. A negative result may occur with  improper specimen collection/handling, submission of specimen other than nasopharyngeal swab, presence of viral mutation(s) within the areas targeted by this assay, and inadequate number of viral copies(<138 copies/mL). A negative result must be combined with clinical observations, patient history, and epidemiological information. The expected result is Negative.  Fact Sheet for Patients:  EntrepreneurPulse.com.au  Fact Sheet for Healthcare Providers:  IncredibleEmployment.be  This test is no t yet approved or cleared by the [place] FDA and  has been authorized for detection and/or diagnosis of SARS-CoV-2 by FDA under an Emergency Use Authorization (EUA). This EUA will remain  in effect (meaning this test can be used) for the duration of the COVID-19 declaration under Section 564(b)(1) of the Act, 21 U.S.C.section 360bbb-3(b)(1), unless the authorization is terminated  or revoked sooner.       Influenza A by PCR NEGATIVE NEGATIVE Final   Influenza B by PCR NEGATIVE NEGATIVE Final    Comment: (NOTE) The Xpert Xpress SARS-CoV-2/FLU/RSV plus assay is intended as an aid in the diagnosis of influenza from Nasopharyngeal swab specimens and should not be used as a sole basis for treatment. Nasal washings and aspirates are unacceptable for Xpert Xpress SARS-CoV-2/FLU/RSV testing.  Fact Sheet for Patients: EntrepreneurPulse.com.au  Fact Sheet for Healthcare Providers: IncredibleEmployment.be  This test is not yet approved or cleared by the [place] FDA and has been authorized for detection and/or diagnosis of SARS-CoV-2 by FDA under an Emergency Use Authorization (EUA). This EUA will remain in effect (meaning this test can be used) for the duration of  the COVID-19 declaration under Section 564(b)(1) of the Act, 21 U.S.C. section 360bbb-3(b)(1), unless the authorization is terminated or revoked.  Performed at [facility], [place] [address], [place], [place] [address]   Blood Culture (routine x 2)     Status: None (Preliminary result)   Collection Time: 07/13/22 10:28 PM   Specimen: BLOOD RIGHT WRIST  Result Value Ref Range Status   Specimen Description BLOOD RIGHT WRIST  Final   Special Requests   Final    BOTTLES DRAWN AEROBIC ONLY Blood Culture results may not be optimal due to an inadequate volume of blood received in culture bottles   Culture   Final    NO GROWTH < 12 HOURS Performed at [facility], [place] [address] [place], [place] [address]  Report Status PENDING  Incomplete    RADIOLOGY STUDIES/RESULTS: DG Chest Port 1 View  Result Date: 07/13/2022 CLINICAL DATA:  Possible sepsis. EXAM: PORTABLE CHEST 1 VIEW COMPARISON:  03/07/2022. FINDINGS: The heart is enlarged the mediastinal contour is stable. Atherosclerotic calcification of the aorta is noted. No consolidation, effusion, or pneumothorax. Cervical spinal fusion hardware is noted. IMPRESSION: No active disease. Electronically Signed   By: Brett Fairy M.D.   On: 07/13/2022 22:10   CT ABDOMEN PELVIS W CONTRAST  Result Date: 07/13/2022 CLINICAL DATA:  Abdominal pain, acute, nonlocalized abdominal pain EXAM: CT ABDOMEN AND PELVIS WITH CONTRAST TECHNIQUE: Multidetector CT imaging of the abdomen and pelvis was performed using the standard protocol following bolus administration of intravenous contrast. RADIATION DOSE REDUCTION: This exam was performed according to the departmental dose-optimization program which includes automated exposure control, adjustment of the mA and/or kV according to patient size and/or use of iterative reconstruction technique. CONTRAST:  36m OMNIPAQUE IOHEXOL 350 MG/ML SOLN COMPARISON:  03/08/2019 FINDINGS: Lower chest: No acute  abnormality Hepatobiliary: No focal hepatic abnormality. Gallbladder unremarkable. Pancreas: No focal abnormality or ductal dilatation. Spleen: No focal abnormality.  Normal size. Adrenals/Urinary Tract: Adrenal glands normal. Mild bilateral hydronephrosis, similar to prior study. No visible renal or ureteral stones. Bladder wall thickening similar to prior study. Stomach/Bowel: Stomach, large and small bowel grossly unremarkable. Vascular/Lymphatic: No evidence of aneurysm or adenopathy. Scattered aortic calcifications. Reproductive: Uterus and adnexa unremarkable. No mass. IUD within the uterus. Other: No free fluid or free air. Musculoskeletal: No acute bony abnormality. IMPRESSION: Stable mild bilateral hydronephrosis to the level of the bladder. No visible obstructing stones. Continued bladder wall thickening which may be related to cystitis. Electronically Signed   By: KRolm BaptiseM.D.   On: 07/13/2022 21:13     LOS: 0 days   SOren Binet MD  [facility]    To contact the attending provider between 7A-7P or the covering provider during after hours 7P-7A, please log into the web site [email or web address] and access using universal [place] password for that web site. If you do not have the password, please call the hospital operator.  07/14/2022, 9:47 AM

## 2022-07-14 NOTE — Assessment & Plan Note (Signed)
Continue Synthroid °

## 2022-07-15 DIAGNOSIS — E871 Hypo-osmolality and hyponatremia: Secondary | ICD-10-CM | POA: Diagnosis not present

## 2022-07-15 DIAGNOSIS — N3000 Acute cystitis without hematuria: Secondary | ICD-10-CM | POA: Diagnosis not present

## 2022-07-15 DIAGNOSIS — C649 Malignant neoplasm of unspecified kidney, except renal pelvis: Secondary | ICD-10-CM | POA: Diagnosis not present

## 2022-07-15 DIAGNOSIS — M1990 Unspecified osteoarthritis, unspecified site: Secondary | ICD-10-CM | POA: Diagnosis not present

## 2022-07-15 DIAGNOSIS — I4891 Unspecified atrial fibrillation: Secondary | ICD-10-CM | POA: Diagnosis not present

## 2022-07-15 DIAGNOSIS — I509 Heart failure, unspecified: Secondary | ICD-10-CM | POA: Diagnosis not present

## 2022-07-15 DIAGNOSIS — E785 Hyperlipidemia, unspecified: Secondary | ICD-10-CM | POA: Diagnosis not present

## 2022-07-15 DIAGNOSIS — G35 Multiple sclerosis: Secondary | ICD-10-CM | POA: Diagnosis not present

## 2022-07-15 DIAGNOSIS — E559 Vitamin D deficiency, unspecified: Secondary | ICD-10-CM | POA: Diagnosis not present

## 2022-07-15 DIAGNOSIS — E039 Hypothyroidism, unspecified: Secondary | ICD-10-CM | POA: Diagnosis not present

## 2022-07-15 LAB — URINE CULTURE: Culture: 50000 — AB

## 2022-07-15 LAB — BASIC METABOLIC PANEL
Anion gap: 11 (ref 5–15)
BUN: 11 mg/dL (ref 8–23)
CO2: 31 mmol/L (ref 22–32)
Calcium: 8.9 mg/dL (ref 8.9–10.3)
Chloride: 96 mmol/L — ABNORMAL LOW (ref 98–111)
Creatinine, Ser: 0.63 mg/dL (ref 0.44–1.00)
GFR, Estimated: >60 mL/min (ref >60)
Glucose, Bld: 83 mg/dL (ref 70–99)
Potassium: 3.8 mmol/L (ref 3.5–5.1)
Sodium: 138 mmol/L (ref 135–145)

## 2022-07-15 MED ORDER — LACTATED RINGERS IV BOLUS: 250.0000 mL | Freq: Once | INTRAVENOUS | Status: DC

## 2022-07-15 MED ORDER — MIDODRINE HCL 5 MG PO TABS
7.5000 mg | ORAL_TABLET | Freq: Three times a day (TID) | ORAL | Status: DC
  Administered 2022-07-15 – 2022-07-17 (×6): 7.5 mg via ORAL
  Filled 2022-07-15 (×6): qty 2

## 2022-07-15 MED ORDER — SODIUM CHLORIDE 0.9 % IV SOLN
1.0000 g | Freq: Three times a day (TID) | INTRAVENOUS | Status: DC
  Administered 2022-07-15 – 2022-07-17 (×6): 1 g via INTRAVENOUS
  Filled 2022-07-15 (×6): qty 20

## 2022-07-15 MED ORDER — BACLOFEN 10 MG PO TABS
10.0000 mg | ORAL_TABLET | Freq: Three times a day (TID) | ORAL | Status: DC | PRN
  Administered 2022-07-15 – 2022-07-16 (×3): 10 mg via ORAL
  Filled 2022-07-15 (×3): qty 1

## 2022-07-15 NOTE — Progress Notes (Signed)
Patient has home CPAP and will self place when ready.  

## 2022-07-15 NOTE — Evaluation (Addendum)
Occupational Therapy Evaluation Patient Details Name: Toni [name] MRN: [id number] DOB: [birth year] Today's Date: 07/15/2022   History of Present Illness The pt is a 76 yr old female who was admitted 07/13/22 from [facility] facility due to lethargy and hyponatremia. PMH includes: PAF, HFpEF EF 45-50%, baclofen pump arthritis, cancer, CHF, HLD, HTN, MS, and cervical fusion.   Clinical Impression   Pt reported at baseline lives in ALF at [facility] and has a caregiver. Pt was using a sara plus for transfers and often would sit during the day in left chair vs WC. Pt complete ADLS while sitting at EOB with set up for UE and aide who assists with LB dressing with lateral leaning. Pt today required min guard to min assist while sitting at EOB but with increase in sitting was able to stable self. Pt requires min assist with UE dressing and max to total with LB ADLS at bed level. Pt agreed to attempt with max x2 with steady with x3 trials for sit to stand. Pt currently with functional limitations due to the deficits listed below (see OT Problem List).  Pt will benefit from skilled OT to increase their safety and independence with ADL and functional mobility for ADL to facilitate discharge to venue listed below.        Recommendations for follow up therapy are one component of a multi-disciplinary discharge planning process, led by the attending physician.  Recommendations may be updated based on patient status, additional functional criteria and insurance authorization.   Follow Up Recommendations  Skilled nursing-short term rehab (<3 hours/day) vs HH therapies with aides at ALF level.     Assistance Recommended at Discharge Frequent or constant Supervision/Assistance  Patient can return home with the following A lot of help with walking and/or transfers;A lot of help with bathing/dressing/bathroom;Assistance with cooking/housework;Assist for transportation    Functional  Status Assessment  Patient has had a recent decline in their functional status and demonstrates the ability to make significant improvements in function in a reasonable and predictable amount of time.  Equipment Recommendations  None recommended by OT    Recommendations for Other Services       Precautions / Restrictions Precautions Precautions: Fall Restrictions Weight Bearing Restrictions: No      Mobility Bed Mobility Overal bed mobility: Needs Assistance Bed Mobility: Supine to Sit, Sit to Supine     Supine to sit: Min assist, +2 for safety/equipment, +2 for physical assistance, HOB elevated Sit to supine: Mod assist, +2 for physical assistance, +2 for safety/equipment   General bed mobility comments: pt able to initiate reach for bed rail and moving LE's off EOB with assist for Rt LE and no assist needed for Lt. Bed pad used to fully pivot hips and scoot to EOB fully. Mod +2 for safety to control lowering trunk and raising LE's onto bed to return to supine. 2+ max to boost superiorly in bed.    Transfers Overall transfer level: Needs assistance Equipment used: Ambulation equipment used Transfers: Sit to/from Stand Sit to Stand: Max assist, +2 physical assistance, +2 safety/equipment, From elevated surface           General transfer comment: attempted sit<>stand 3x from elevated EOB with Denna Haggard. pt was able to initiate power up but unable to clear hips from EOB wihtout Max+2 assist. pt achieve ~50% upright stand each time. Transfer via Lift Equipment: Stedy    Balance Overall balance assessment: Needs assistance Sitting-balance support: Bilateral upper extremity supported,  Feet supported Sitting balance-Leahy Scale: Fair Sitting balance - Comments: AS pt increase in sitting tolerance was able to complete some reaching with unilateral to unsupported sitting for brief periods Postural control: Posterior lean Standing balance support: Bilateral upper extremity  supported, Reliant on assistive device for balance Standing balance-Leahy Scale: Poor Standing balance comment: use of steady                           ADL either performed or assessed with clinical judgement   ADL Overall ADL's : Needs assistance/impaired Eating/Feeding: Set up;Sitting   Grooming: Wash/dry hands;Wash/dry face;Min guard;Cueing for safety;Cueing for sequencing;Sitting   Upper Body Bathing: Minimal assistance;Sitting   Lower Body Bathing: Total assistance;Cueing for safety;Cueing for sequencing;Sit to/from stand   Upper Body Dressing : Minimal assistance;Sitting   Lower Body Dressing: Total assistance;Sitting/lateral leans;Sit to/from stand   Toilet Transfer:  (unable to complete at this time)   Toileting- Water quality scientist and Hygiene: Total assistance;Bed level         General ADL Comments: Pt reports they do not ambulate but uses the sara plus to complete transfers and remains in WC vs lift chair during the day     Vision Baseline Vision/History: 1 Wears glasses Patient Visual Report: No change from baseline Vision Assessment?: No apparent visual deficits     Perception     Praxis      Pertinent Vitals/Pain Pain Assessment Pain Assessment: Faces Faces Pain Scale: Hurts little more Pain Location: trunk/belly "MS hug" sensation Pain Descriptors / Indicators: Discomfort, Tightness Pain Intervention(s): Limited activity within patient's tolerance, Monitored during session, Repositioned     Hand Dominance Right   Extremity/Trunk Assessment Upper Extremity Assessment Upper Extremity Assessment: RUE deficits/detail;LUE deficits/detail RUE Deficits / Details: Pt at  baseline reproted they were able to work with about 4 pound weights but limited in AROM due to MS. Pt has decrease in grip and uses adaptive utensils. Pt able to complete about 70 degrees flexion in BUE. Pt reports they often feel more weakn on R side compared to L RUE  Sensation: WNL RUE Coordination: decreased fine motor;decreased gross motor LUE Deficits / Details: Pt at baseline reproted they were able to work with about 4 pound weights but limited in AROM due to MS. Pt has decrease in grip and uses adaptive utensils. Pt able to complete about 70 degrees flexion in BUE. LUE Sensation: WNL LUE Coordination: decreased fine motor;decreased gross motor   Lower Extremity Assessment Lower Extremity Assessment: Defer to PT evaluation RLE Deficits / Details: grossly 2-/5 or less for LE strength. ROM limited at ankle RLE Coordination: decreased gross motor;decreased fine motor LLE Coordination: decreased gross motor;decreased fine motor       Communication Communication Communication: No difficulties   Cognition Arousal/Alertness: Awake/alert Behavior During Therapy: WFL for tasks assessed/performed Overall Cognitive Status: Within Functional Limits for tasks assessed                                       General Comments       Exercises     Shoulder Instructions      Home Living Family/patient expects to be discharged to:: Assisted living                             Home Equipment: BSC/3in1;Adaptive equipment;Wheelchair - IAC/[employer]  bed   Additional Comments: [employer]      Prior Functioning/Environment Prior Level of Function : Needs assist       Physical Assist : Mobility (physical);ADLs (physical) Mobility (physical): Bed mobility;Transfers ADLs (physical): Grooming;Bathing;Dressing;Toileting;IADLs Mobility Comments: uses Clarise Cruz plus lift to transfer bed>wheelchair, BSC, lift chair. able to sit up to EOB with some assist. ADLs Comments: dresses at side of bed, wears compression hose that aides put on, then dresses her top with some help for bra. aide helps with bottoms.pt states she knows to pull clohting on bad leg/side first. has adapted utensils for eating. uses BSC for toileting.         OT Problem List: Decreased strength;Decreased range of motion;Decreased activity tolerance;Impaired balance (sitting and/or standing);Decreased safety awareness;Decreased knowledge of use of DME or AE;Cardiopulmonary status limiting activity;Pain      OT Treatment/Interventions: Self-care/ADL training;Therapeutic exercise;Therapeutic activities;Patient/family education;Balance training    OT Goals(Current goals can be found in the care plan section) Acute Rehab OT Goals Patient Stated Goal: to keep trying with therapy OT Goal Formulation: With patient Time For Goal Achievement: 07/29/22 Potential to Achieve Goals: Good  OT Frequency: Min 2X/week    Co-evaluation PT/OT/SLP Co-Evaluation/Treatment: Yes Reason for Co-Treatment: Complexity of the patient's impairments (multi-system involvement) PT goals addressed during session: Mobility/safety with mobility;Balance;Proper use of DME;Strengthening/ROM OT goals addressed during session: ADL's and self-care      AM-PAC OT "6 Clicks" Daily Activity     Outcome Measure Help from another person eating meals?: A Little Help from another person taking care of personal grooming?: A Little Help from another person toileting, which includes using toliet, bedpan, or urinal?: Total Help from another person bathing (including washing, rinsing, drying)?: A Lot Help from another person to put on and taking off regular upper body clothing?: A Little Help from another person to put on and taking off regular lower body clothing?: A Lot 6 Click Score: 14   End of Session Equipment Utilized During Treatment: Gait belt Nurse Communication: Mobility status  Activity Tolerance: Patient tolerated treatment well Patient left: in bed;with call bell/phone within reach;with family/visitor present;with bed alarm set  OT Visit Diagnosis: Unsteadiness on feet (R26.81);Other abnormalities of gait and mobility (R26.89);Repeated falls (R29.6);Muscle weakness  (generalized) (M62.81);Pain Pain - part of body:  (stomach)                Time: 3893-7342 OT Time Calculation (min): 28 min Charges:  OT General Charges $OT Visit: 1 Visit OT Evaluation $OT Eval Moderate Complexity: 1 Mod  Joeseph Amor OTR/L  Acute Rehab Services  [phone] office number [phone] pager number   Joeseph Amor 07/15/2022, 12:34 PM

## 2022-07-15 NOTE — Progress Notes (Signed)
PROGRESS NOTE        PATIENT DETAILS Name: Toni [name] Age: 76 y.o. Sex: female Date of Birth: 1946/01/06 Admit Date: 07/13/2022 Admitting Physician Evalee Mutton Kristeen Mans, MD VVO:[id number], Brandt Loosen, MD  Brief Summary: Patient is a 76 y.o.  female with history of multiple sclerosis-bed to wheelchair bound, chronic HFpEF, PAF on Eliquis, OSA on BiPAP-who was sent from SNF for lethargy/hyponatremia.  Apparently patient developed ileus several days ago-was placed on liquid diet, and was given numerous enemas/suppositories with profuse bowel movement.  Significant events: 11/23>> admit to [facility].  Hyponatremia/lethargy.  Significant studies: 11/23>> CT abdomen/pelvis: No SBO/ileus, mild bilateral hydronephrosis. 11/23>> CXR: No PNA  Significant microbiology data: 11/23>> COVID/influenza PCR: Negative 11/23>> blood culture: No growth 11/23 urine culture: Pending  Procedures: None  Consults: None  Subjective: Had some issues sleeping but appears much more awake/alert than yesterday.  No major issues overnight.  Objective: Vitals: Blood pressure (!) 101/43, pulse 86, temperature 98.7 F (37.1 C), temperature source Oral, resp. rate 18, height '5\' 3"'$  (1.6 m), weight 87.5 kg, SpO2 100 %.   Exam: Gen Exam:Alert awake-not in any distress HEENT:atraumatic, normocephalic Chest: B/L clear to auscultation anteriorly CVS:S1S2 regular Abdomen:soft non tender, non distended Extremities:no edema Neurology: Non focal Skin: no rash  Pertinent Labs/Radiology:    Latest Ref Rng & Units 07/14/2022    4:50 AM 07/13/2022    8:30 PM 05/29/2022   11:20 AM  CBC  WBC 4.0 - 10.5 K/uL 7.5  6.7  7.4   Hemoglobin 12.0 - 15.0 g/dL 12.2  13.0  12.6   Hematocrit 36.0 - 46.0 % 35.9  37.9  37.2   Platelets 150 - 400 K/uL 158  157  258.0     Lab Results  Component Value Date   NA 138 07/15/2022   K 3.8 07/15/2022   CL 96 (L) 07/15/2022   CO2 31 07/15/2022       Assessment/Plan: Hyponatremia  Suspect this was acute-and was due to patient being on predominantly clear liquid diet/bowel purge with profuse watery stools. Sodium level stable  Complicated UTI Acknowledges frequency/dysuria Stable-afebrile-no leukocytosis Remains on Rocephin Cultures positive for gram-negative rod-await final data.  Recent history of ileus Seems to have resolved CT imaging negative Maintain on bowel regimen with MiraLAX/senna  Chronic HFpEF No evidence of volume overload Per patient-she was recently placed on Entresto which she did not tolerate and this was stopped at [facility]. Continue Demadex/beta-blocker at reduced dose-given soft blood pressure  Permanent atrial fibrillation Rate controlled Continue low-dose Toprol given soft BP  Remains on Eliquis   Hypotension Chronic issue-asymptomatic-given bedbound status-suspect she is on soft at baseline.  Prior ACTH stim test 2021 was negative  BP remains soft-will increase midodrine to 7.5 mg 3 times daily  Follow-up.  HLD Continue statin  Hypothyroidism Continue Synthroid  Multiple sclerosis Baclofen pump in place Continue gabapentin Follow-up with neurology postdischarge. Mostly bed to wheelchair bound, but able to sit/stand Apparently gets PT/OT at SNF.  Depression/anxiety Resume Wellbutrin/Lexapro Continue Xanax  OSA BiPAP nightly  Obesity Estimated body mass index is 34.17 kg/m as calculated from the following:   Height as of this encounter: '5\' 3"'$  (1.6 m).   Weight as of this encounter: 87.5 kg.   Code status:   Code Status: Full Code   DVT Prophylaxis: apixaban (ELIQUIS) tablet 5 mg  Family Communication: None at bedside   Disposition Plan: Status is: Inpatient Remains inpatient appropriate because: Hyponatremia/UTI-fluctuating hypotension-not yet stable for discharge.   Planned Discharge Destination:Skilled nursing facility hopefully 11/26   Diet: Diet Order              Diet Heart Room service appropriate? Yes; Fluid consistency: Thin  Diet effective now                     Antimicrobial agents: Anti-infectives (From admission, onward)    Start     Dose/Rate Route Frequency Ordered Stop   07/13/22 2145  cefTRIAXone (ROCEPHIN) 2 g in sodium chloride 0.9 % 100 mL IVPB        2 g 200 mL/hr over 30 Minutes Intravenous Every 24 hours 07/13/22 2141 07/20/22 2144        MEDICATIONS: Scheduled Meds:  apixaban  5 mg Oral BID   buPROPion  150 mg Oral Daily   escitalopram  10 mg Oral QHS   gabapentin  300 mg Oral QID   levothyroxine  75 mcg Oral Q0600   melatonin  5 mg Oral QHS   metoprolol succinate  12.5 mg Oral Daily   midodrine  7.5 mg Oral TID WC   polyethylene glycol  17 g Oral Daily   senna-docusate  2 tablet Oral QHS   simvastatin  10 mg Oral QHS   sodium chloride flush  3 mL Intravenous Q12H   torsemide  20 mg Oral Daily   Continuous Infusions:  cefTRIAXone (ROCEPHIN)  IV 2 g (07/14/22 2248)   PRN Meds:.acetaminophen **OR** acetaminophen, ALPRAZolam, baclofen, ondansetron **OR** ondansetron (ZOFRAN) IV, oxybutynin, phenazopyridine   I have personally reviewed following labs and imaging studies  LABORATORY DATA: CBC: Recent Labs  Lab 07/13/22 2030 07/14/22 0450  WBC 6.7 7.5  NEUTROABS 3.9  --   HGB 13.0 12.2  HCT 37.9 35.9*  MCV 91.3 91.8  PLT 157 158     Basic Metabolic Panel: Recent Labs  Lab 07/13/22 2030 07/14/22 0450 07/15/22 0718  NA 125* 139 138  K 4.8 4.0 3.8  CL 88* 99 96*  CO2 '26 29 31  '$ GLUCOSE 157* 75 83  BUN '13 9 11  '$ CREATININE 0.63 0.48 0.63  CALCIUM 8.2* 8.7* 8.9  MG 1.8  --   --      GFR: Estimated Creatinine Clearance: 63.7 mL/min (by C-G formula based on SCr of 0.63 mg/dL).  Liver Function Tests: Recent Labs  Lab 07/13/22 2030  AST 20  ALT 7  ALKPHOS 63  BILITOT 1.1  PROT 5.5*  ALBUMIN 3.0*    No results for input(s): "LIPASE", "AMYLASE" in the last 168 hours. No  results for input(s): "AMMONIA" in the last 168 hours.  Coagulation Profile: Recent Labs  Lab 07/13/22 2030  INR 1.4*     Cardiac Enzymes: No results for input(s): "CKTOTAL", "CKMB", "CKMBINDEX", "TROPONINI" in the last 168 hours.  BNP (last 3 results) No results for input(s): "PROBNP" in the last 8760 hours.  Lipid Profile: No results for input(s): "CHOL", "HDL", "LDLCALC", "TRIG", "CHOLHDL", "LDLDIRECT" in the last 72 hours.  Thyroid Function Tests: No results for input(s): "TSH", "T4TOTAL", "FREET4", "T3FREE", "THYROIDAB" in the last 72 hours.  Anemia Panel: No results for input(s): "VITAMINB12", "FOLATE", "FERRITIN", "TIBC", "IRON", "RETICCTPCT" in the last 72 hours.  Urine analysis:    Component Value Date/Time   COLORURINE YELLOW 07/13/2022 2148   APPEARANCEUR CLOUDY (A) 07/13/2022 2148   APPEARANCEUR Clear 12/18/2014  [place] 1.009 07/13/2022 2148   PHURINE 6.0 07/13/2022 2148   GLUCOSEU NEGATIVE 07/13/2022 2148   HGBUR SMALL (A) 07/13/2022 2148   BILIRUBINUR NEGATIVE 07/13/2022 2148   BILIRUBINUR Negative 12/18/2014 [place] 07/13/2022 2148   PROTEINUR NEGATIVE 07/13/2022 2148   UROBILINOGEN 0.2 08/05/2014 0951   NITRITE NEGATIVE 07/13/2022 2148   LEUKOCYTESUR LARGE (A) 07/13/2022 2148    Sepsis Labs: Lactic Acid, Venous    Component Value Date/Time   LATICACIDVEN 1.3 07/13/2022 2230    MICROBIOLOGY: Recent Results (from the past 240 hour(s))  Urine Culture     Status: Abnormal (Preliminary result)   Collection Time: 07/13/22  9:25 PM   Specimen: Urine, Clean Catch  Result Value Ref Range Status   Specimen Description URINE, CLEAN CATCH  Final   Special Requests NONE  Final   Culture (A)  Final    50,000 COLONIES/mL GRAM NEGATIVE RODS IDENTIFICATION AND SUSCEPTIBILITIES TO FOLLOW Performed at [facility], [address] [place], [place] [address]    Report Status PENDING  Incomplete  Resp Panel by RT-PCR (Flu A&B,  Covid) Anterior Nasal Swab     Status: None   Collection Time: 07/13/22  9:41 PM   Specimen: Anterior Nasal Swab  Result Value Ref Range Status   SARS Coronavirus 2 by RT PCR NEGATIVE NEGATIVE Final    Comment: (NOTE) SARS-CoV-2 target nucleic acids are NOT DETECTED.  The SARS-CoV-2 RNA is generally detectable in upper respiratory specimens during the acute phase of infection. The lowest concentration of SARS-CoV-2 viral copies this assay can detect is 138 copies/mL. A negative result does not preclude SARS-Cov-2 infection and should not be used as the sole basis for treatment or other patient management decisions. A negative result may occur with  improper specimen collection/handling, submission of specimen other than nasopharyngeal swab, presence of viral mutation(s) within the areas targeted by this assay, and inadequate number of viral copies(<138 copies/mL). A negative result must be combined with clinical observations, patient history, and epidemiological information. The expected result is Negative.  Fact Sheet for Patients:  EntrepreneurPulse.com.au  Fact Sheet for Healthcare Providers:  IncredibleEmployment.be  This test is no t yet approved or cleared by the [place] FDA and  has been authorized for detection and/or diagnosis of SARS-CoV-2 by FDA under an Emergency Use Authorization (EUA). This EUA will remain  in effect (meaning this test can be used) for the duration of the COVID-19 declaration under Section 564(b)(1) of the Act, 21 U.S.C.section 360bbb-3(b)(1), unless the authorization is terminated  or revoked sooner.       Influenza A by PCR NEGATIVE NEGATIVE Final   Influenza B by PCR NEGATIVE NEGATIVE Final    Comment: (NOTE) The Xpert Xpress SARS-CoV-2/FLU/RSV plus assay is intended as an aid in the diagnosis of influenza from Nasopharyngeal swab specimens and should not be used as a sole basis for treatment. Nasal  washings and aspirates are unacceptable for Xpert Xpress SARS-CoV-2/FLU/RSV testing.  Fact Sheet for Patients: EntrepreneurPulse.com.au  Fact Sheet for Healthcare Providers: IncredibleEmployment.be  This test is not yet approved or cleared by the [place] FDA and has been authorized for detection and/or diagnosis of SARS-CoV-2 by FDA under an Emergency Use Authorization (EUA). This EUA will remain in effect (meaning this test can be used) for the duration of the COVID-19 declaration under Section 564(b)(1) of the Act, 21 U.S.C. section 360bbb-3(b)(1), unless the authorization is terminated or revoked.  Performed at [facility]  [facility] Lab, [place] [address], [place], [place] [address]   Blood Culture (routine x 2)     Status: None (Preliminary result)   Collection Time: 07/13/22 10:28 PM   Specimen: BLOOD RIGHT WRIST  Result Value Ref Range Status   Specimen Description BLOOD RIGHT WRIST  Final   Special Requests   Final    BOTTLES DRAWN AEROBIC ONLY Blood Culture results may not be optimal due to an inadequate volume of blood received in culture bottles   Culture   Final    NO GROWTH 2 DAYS Performed at [facility], [place] [address] [place], [place] [address]    Report Status PENDING  Incomplete  Blood Culture (routine x 2)     Status: None (Preliminary result)   Collection Time: 07/14/22  8:07 PM   Specimen: BLOOD  Result Value Ref Range Status   Specimen Description BLOOD RIGHT ANTECUBITAL  Final   Special Requests   Final    BOTTLES DRAWN AEROBIC AND ANAEROBIC Blood Culture adequate volume   Culture   Final    NO GROWTH < 12 HOURS Performed at [facility], [place] [address], [place], [place] [address]    Report Status PENDING  Incomplete    RADIOLOGY STUDIES/RESULTS: DG Chest Port 1 View  Result Date: 07/13/2022 CLINICAL DATA:  Possible sepsis. EXAM: PORTABLE CHEST 1 VIEW COMPARISON:  03/07/2022. FINDINGS: The heart is  enlarged the mediastinal contour is stable. Atherosclerotic calcification of the aorta is noted. No consolidation, effusion, or pneumothorax. Cervical spinal fusion hardware is noted. IMPRESSION: No active disease. Electronically Signed   By: Brett Fairy M.D.   On: 07/13/2022 22:10   CT ABDOMEN PELVIS W CONTRAST  Result Date: 07/13/2022 CLINICAL DATA:  Abdominal pain, acute, nonlocalized abdominal pain EXAM: CT ABDOMEN AND PELVIS WITH CONTRAST TECHNIQUE: Multidetector CT imaging of the abdomen and pelvis was performed using the standard protocol following bolus administration of intravenous contrast. RADIATION DOSE REDUCTION: This exam was performed according to the departmental dose-optimization program which includes automated exposure control, adjustment of the mA and/or kV according to patient size and/or use of iterative reconstruction technique. CONTRAST:  33m OMNIPAQUE IOHEXOL 350 MG/ML SOLN COMPARISON:  03/08/2019 FINDINGS: Lower chest: No acute abnormality Hepatobiliary: No focal hepatic abnormality. Gallbladder unremarkable. Pancreas: No focal abnormality or ductal dilatation. Spleen: No focal abnormality.  Normal size. Adrenals/Urinary Tract: Adrenal glands normal. Mild bilateral hydronephrosis, similar to prior study. No visible renal or ureteral stones. Bladder wall thickening similar to prior study. Stomach/Bowel: Stomach, large and small bowel grossly unremarkable. Vascular/Lymphatic: No evidence of aneurysm or adenopathy. Scattered aortic calcifications. Reproductive: Uterus and adnexa unremarkable. No mass. IUD within the uterus. Other: No free fluid or free air. Musculoskeletal: No acute bony abnormality. IMPRESSION: Stable mild bilateral hydronephrosis to the level of the bladder. No visible obstructing stones. Continued bladder wall thickening which may be related to cystitis. Electronically Signed   By: KRolm BaptiseM.D.   On: 07/13/2022 21:13     LOS: 1 day   SOren Binet  MD  [facility]    To contact the attending provider between 7A-7P or the covering provider during after hours 7P-7A, please log into the web site [email or web address] and access using universal [place] password for that web site. If you do not have the password, please call the hospital operator.  07/15/2022, 9:50 AM

## 2022-07-15 NOTE — Progress Notes (Signed)
Pharmacy Antibiotic Note  Toni [name] is a 75 y.o. female admitted on 07/13/2022 with UTI. Pt was initiated on ceftriaxone for empiric treatment of UTI. Urine culture growing ESBL E. coli susceptible to gentamicin, imipenem, and nitrofurantoin. Pharmacy has been consulted for meropenem dosing.  Plan: Discontinue Ceftriaxone Start Meropenem 1g IV q8h  Monitor daily CBC, temp, SCr, and for clinical signs of improvement  F/u plan for duration of therapy  Height: '5\' 3"'$  (160 cm) Weight: 87.5 kg (192 lb 14.4 oz) IBW/kg (Calculated) : 52.4  Temp (24hrs), Avg:98.2 F (36.8 C), Min:97.8 F (36.6 C), Max:98.7 F (37.1 C)  Recent Labs  Lab 07/13/22 2030 07/13/22 2230 07/14/22 0450 07/15/22 0718  WBC 6.7  --  7.5  --   CREATININE 0.63  --  0.48 0.63  LATICACIDVEN  --  1.3  --   --     Estimated Creatinine Clearance: 63.7 mL/min (by C-G formula based on SCr of 0.63 mg/dL).    Allergies  Allergen Reactions   Other Rash    Topical Surgical prep  - severe rash   Chlorhexidine Hives and Other (See Comments)    And, sores appear where applied   Hydrocodone Nausea And Vomiting and Other (See Comments)    Hydrocodone causes vomiting    Oxycodone Nausea And Vomiting and Other (See Comments)    Oral oxycodone causes vomiting   Sertraline Hives, Swelling, Rash and Other (See Comments)    [DERM][OTHER]RASH WITH SWELLING   Macrobid [Nitrofurantoin]     Other reaction(s): 3.9.17 nausea and vomitting,  NOT DOCUMENTED ON THE MAR   Phenol Hives    NOT DOCUMENTED ON THE MAR   Rofecoxib     Other reaction(s): Unknown  NOT DOCUMENTED ON THE MAR    Antimicrobials this admission: Ceftriaxone 11/23 >> 11/25 Meropenem 11/25 >>   Dose adjustments this admission: N/A  Microbiology results: 11/23 BCx: NGTD x2d 11/24 BCx: NGTD <12h 11/24 UCx: ESBL E. coli - S: gent, imipenem, nitrofurantoin  Thank you for allowing pharmacy to be a part of this patient's care.  Luisa Hart, PharmD,  BCPS Clinical Pharmacist 07/15/2022 12:21 PM   Please refer to AMION for pharmacy phone number

## 2022-07-15 NOTE — Evaluation (Signed)
Physical Therapy Evaluation Patient Details Name: Toni [name] MRN: [id number] DOB: [birth year] Today's Date: 07/15/2022  History of Present Illness  The pt is a 76 yr old female who was admitted 07/13/22 from [facility] facility due to lethargy and hyponatremia. PMH includes: PAF, HFpEF EF 45-50%, baclofen pump arthritis, cancer, CHF, HLD, HTN, MS, and cervical fusion.   Clinical Impression  Toni [name] is 76 y.o. female admitted with above HPI and diagnosis. Patient is currently limited by functional impairments below (see PT problem list). [facility] at [facility] for ~1.5 years and requires assist with lift for transfers bed<>chair at baseline. Patient has a home aide and assist from staff as well. Pt was able to complete supine>sit transfer with min +2 assist and return to supine with Mod +2. 3x 50% stand completed with Denna Haggard and Max +2 assist for safety. Pt demonstrated good effort to initiate rise but unable to fully clear hips from bed without assist. Patient will benefit from continued skilled PT interventions to address impairments and progress independence with mobility, recommending return to facility with ongoing PT/OT services to address functional impairments. Acute PT will follow and progress as able.        Recommendations for follow up therapy are one component of a multi-disciplinary discharge planning process, led by the attending physician.  Recommendations may be updated based on patient status, additional functional criteria and insurance authorization.  Follow Up Recommendations Skilled nursing-short term rehab (<3 hours/day) (pt resident at [facility] - can [place] at facility with ongoing assist from aides.) Can patient physically be transported by private vehicle: No    Assistance Recommended at Discharge Frequent or constant Supervision/Assistance  Patient can return home with the following  Two people to help  with walking and/or transfers;Two people to help with bathing/dressing/bathroom;Direct supervision/assist for medications management;Assist for transportation    Equipment Recommendations None recommended by PT  Recommendations for Other Services       Functional Status Assessment Patient has had a recent decline in their functional status and demonstrates the ability to make significant improvements in function in a reasonable and predictable amount of time.     Precautions / Restrictions Precautions Precautions: Fall Restrictions Weight Bearing Restrictions: No      Mobility  Bed Mobility Overal bed mobility: Needs Assistance Bed Mobility: Supine to Sit, Sit to Supine     Supine to sit: Min assist, +2 for safety/equipment, +2 for physical assistance, HOB elevated Sit to supine: Mod assist, +2 for physical assistance, +2 for safety/equipment   General bed mobility comments: pt able to initiate reach for bed rail and moving LE's off EOB with assist for Rt LE and no assist needed for Lt. Bed pad used to fully pivot hips and scoot to EOB fully. Mod +2 for safety to control lowering trunk and raising LE's onto bed to return to supine. 2+ max to boost superiorly in bed.    Transfers Overall transfer level: Needs assistance Equipment used: Ambulation equipment used Transfers: Sit to/from Stand Sit to Stand: Max assist, +2 physical assistance, +2 safety/equipment, From elevated surface           General transfer comment: attempted sit<>stand 3x from elevated EOB with Denna Haggard. pt was able to initiate power up but unable to clear hips from EOB wihtout Max+2 assist. pt achieve ~50% upright stand each time. Transfer via Lift Equipment: Stedy  Ambulation/Gait  Stairs            Wheelchair Mobility    Modified Rankin (Stroke Patients Only)       Balance                                             Pertinent Vitals/Pain  Pain Assessment Pain Assessment: Faces Faces Pain Scale: Hurts little more Pain Location: trunk/belly "MS hug" sensation Pain Descriptors / Indicators: Discomfort, Tightness Pain Intervention(s): Limited activity within patient's tolerance, Monitored during session, Repositioned    Home Living Family/patient expects to be discharged to:: Assisted living                 Home Equipment: BSC/3in1;Adaptive equipment;Wheelchair - manual;Hospital bed (has a [employer] lift at facility) Additional Comments: Environmental education officer    Prior Function Prior Level of Function : Needs assist       Physical Assist : Mobility (physical);ADLs (physical) Mobility (physical): Bed mobility;Transfers ADLs (physical): Grooming;Bathing;Dressing;Toileting;IADLs Mobility Comments: uses Clarise Cruz plus lift to transfer bed>wheelchair, BSC, lift chair. able to sit up to EOB with some assist. ADLs Comments: dresses at side of bed, wears compression hose that aides put on, then dresses her top with some help for bra. aide helps with bottoms.pt states she knows to pull clohting on bad leg/side first. has adapted utensils for eating. uses BSC for toileting.     Hand Dominance        Extremity/Trunk Assessment   Upper Extremity Assessment Upper Extremity Assessment: Defer to OT evaluation    Lower Extremity Assessment Lower Extremity Assessment: RLE deficits/detail;LLE deficits/detail;Generalized weakness RLE Deficits / Details: grossly 2-/5 or less for LE strength. ROM limited at ankle RLE Coordination: decreased gross motor;decreased fine motor LLE Coordination: decreased gross motor;decreased fine motor       Communication   Communication: No difficulties  Cognition Arousal/Alertness: Awake/alert Behavior During Therapy: WFL for tasks assessed/performed Overall Cognitive Status: Within Functional Limits for tasks assessed                                          General Comments       Exercises     Assessment/Plan    PT Assessment Patient needs continued PT services  PT Problem List Decreased strength;Decreased range of motion;Decreased activity tolerance;Decreased balance;Decreased mobility;Decreased knowledge of use of DME;Decreased coordination;Obesity;Impaired tone;Impaired sensation       PT Treatment Interventions DME instruction;Functional mobility training;Therapeutic activities;Therapeutic exercise;Balance training;Neuromuscular re-education;Patient/family education    PT Goals (Current goals can be found in the Care Plan section)  Acute Rehab PT Goals Patient Stated Goal: get back to facility and keep working with therapy PT Goal Formulation: With patient Time For Goal Achievement: 07/29/22 Potential to Achieve Goals: Good    Frequency Min 2X/week     Co-evaluation PT/OT/SLP Co-Evaluation/Treatment: Yes Reason for Co-Treatment: To address functional/ADL transfers;For patient/therapist safety PT goals addressed during session: Mobility/safety with mobility;Balance;Proper use of DME;Strengthening/ROM OT goals addressed during session: ADL's and self-care;Strengthening/ROM       AM-PAC PT "6 Clicks" Mobility  Outcome Measure Help needed turning from your back to your side while in a flat bed without using bedrails?: A Little Help needed moving from lying on your back to sitting on the side of a flat bed without using  bedrails?: A Lot Help needed moving to and from a bed to a chair (including a wheelchair)?: Total Help needed standing up from a chair using your arms (e.g., wheelchair or bedside chair)?: Total Help needed to walk in hospital room?: Total Help needed climbing 3-5 steps with a railing? : Total 6 Click Score: 9    End of Session Equipment Utilized During Treatment: Gait belt Activity Tolerance: Patient tolerated treatment well Patient left: in bed;with call bell/phone within reach;with bed alarm set;with family/visitor  present Nurse Communication: Mobility status PT Visit Diagnosis: Muscle weakness (generalized) (M62.81);Difficulty in walking, not elsewhere classified (R26.2);Other symptoms and signs involving the nervous system (R29.898);Other abnormalities of gait and mobility (R26.89)    Time: 4388-8757 PT Time Calculation (min) (ACUTE ONLY): 29 min   Charges:   PT Evaluation $PT Eval Moderate Complexity: 1 Mod          Verner Mould, DPT Acute Rehabilitation Services Office [phone]  07/15/22 11:42 AM

## 2022-07-15 NOTE — Progress Notes (Signed)
Patient requested to wear hospital BIPAP to rest, RT set up hospital machine for pt. When pt saw how BIPAP machine looked she requested to use her home one that she brought. RT disconnected hospital machine and set up home machine for pt. Home machine in working condition, pt comfortable with stable vitals.

## 2022-07-16 DIAGNOSIS — G35 Multiple sclerosis: Secondary | ICD-10-CM | POA: Diagnosis not present

## 2022-07-16 DIAGNOSIS — E039 Hypothyroidism, unspecified: Secondary | ICD-10-CM | POA: Diagnosis not present

## 2022-07-16 DIAGNOSIS — N3000 Acute cystitis without hematuria: Secondary | ICD-10-CM | POA: Diagnosis not present

## 2022-07-16 DIAGNOSIS — E871 Hypo-osmolality and hyponatremia: Secondary | ICD-10-CM | POA: Diagnosis not present

## 2022-07-16 LAB — BASIC METABOLIC PANEL
Anion gap: 13 (ref 5–15)
BUN: 15 mg/dL (ref 8–23)
CO2: 31 mmol/L (ref 22–32)
Calcium: 8.9 mg/dL (ref 8.9–10.3)
Chloride: 93 mmol/L — ABNORMAL LOW (ref 98–111)
Creatinine, Ser: 0.59 mg/dL (ref 0.44–1.00)
GFR, Estimated: >60 mL/min (ref >60)
Glucose, Bld: 83 mg/dL (ref 70–99)
Potassium: 3.5 mmol/L (ref 3.5–5.1)
Sodium: 137 mmol/L (ref 135–145)

## 2022-07-16 LAB — CBC
HCT: 34.2 % — ABNORMAL LOW (ref 36.0–46.0)
Hemoglobin: 11.8 g/dL — ABNORMAL LOW (ref 12.0–15.0)
MCH: 31.4 pg (ref 26.0–34.0)
MCHC: 34.5 g/dL (ref 30.0–36.0)
MCV: 91 fL (ref 80.0–100.0)
Platelets: 181 10*3/uL (ref 150–400)
RBC: 3.76 MIL/uL — ABNORMAL LOW (ref 3.87–5.11)
RDW: 12.7 % (ref 11.5–15.5)
WBC: 7.1 10*3/uL (ref 4.0–10.5)
nRBC: 0 % (ref 0.0–0.2)

## 2022-07-16 MED ORDER — TORSEMIDE 20 MG PO TABS
40.0000 mg | ORAL_TABLET | Freq: Every day | ORAL | Status: DC
  Administered 2022-07-16 – 2022-07-17 (×2): 40 mg via ORAL
  Filled 2022-07-16 (×2): qty 2

## 2022-07-16 NOTE — NC FL2 (Signed)
[place] LEVEL OF CARE SCREENING TOOL     IDENTIFICATION  Patient Name: Toni [name] Birthdate: 18-Oct-1945 Sex: female Admission Date (Current Location): 07/13/2022  [facility] and [place] Number:  Herbalist and Address:  [facility], [place] [address], [place], [place] [address]      Provider Number: [id number]  Attending Physician Name and Address:  [name], MD  Relative Name and Phone Number:  Witz,Kristin [phone]    Current Level of Care: Hospital Recommended Level of Care: [place] Prior Approval Number:    Date Approved/Denied:   PASRR Number: [id number]  Discharge Plan: SNF    Current Diagnoses: Patient Active Problem List   Diagnosis Date Noted   (HFpEF) heart failure with preserved ejection fraction ([place]) 07/14/2022   Hyponatremia 07/13/2022   Cushing's syndrome, ACTH dependent ([place]) 09/12/2021   Presence of intrathecal pump 08/18/2021   Spasticity 02/18/2021   Bladder spasms 01/04/2021   UTI (urinary tract infection) 01/03/2021   Bilateral carpal tunnel syndrome 10/04/2020   Multiple sclerosis, 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[place]) 09/13/2020   Chronic intermittent hypoxia with obstructive sleep apnea 09/13/2020   COVID-19 long hauler manifesting chronic dyspnea 09/13/2020   Chronic diastolic CHF (congestive heart failure) ([place]) 09/13/2020   Acute respiratory failure with hypoxia ([place]) 09/13/2020   Class 2 severe obesity with serious comorbidity and body mass index (BMI) of 35.0 to 35.9 in adult ([place]) 09/13/2020   Sepsis ([place]) 11/94/1740   Acute metabolic encephalopathy 81/44/8185   Atrial fibrillation with RVR ([place]) 04/07/2020   Recurrent UTI 63/14/9702   Uncomplicated degenerative myopia of both eyes 03/19/2020   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/2020   Oxygen dependent 02/26/2020   Neurogenic hypoventilation 02/26/2020   Complex atypical endometrial hyperplasia    Acute on chronic  respiratory failure with hypoxia ([place]) 10/10/2019   Multiple sclerosis ([place]) 10/10/2019   COVID-19 virus infection 08/23/2019   Bilateral leg weakness 08/04/2019   Weakness 08/04/2019   Unilateral primary osteoarthritis, left knee 01/09/2019   Obstructive sleep apnea treated with bilevel positive airway pressure (BiPAP) 08/15/2018   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 ([place]) 11/21/2017   Permanent atrial fibrillation ([place]) 11/21/2017   Tightness of leg fascia 11/21/2017   Muscle spasm of both lower legs 11/21/2017   Carpal tunnel syndrome, left upper limb 09/13/2017   Carpal tunnel syndrome, right upper limb 09/13/2017   Other insomnia 06/11/2017   Depression with anxiety 06/11/2017   Vitamin D deficiency 03/22/2017   Right hand weakness 08/06/2016   Nonischemic cardiomyopathy ([place]) 05/09/2016   Localized edema 05/09/2016   Long term current use of anticoagulant therapy 02/03/2016   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   Obesity hypoventilation syndrome ([place]) 03/30/2015   Abnormality of gait 03/01/2015   Chronic constipation with overflow incontinence 01/25/2015   ACTH dependent Cushing's syndrome ([place]) 01/25/2015   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) ([place]) 01/25/2015   Neurogenic urinary bladder disorder 08/24/2014   Varicose veins of both lower extremities with complications 63/78/5885   Pseudophakia of both eyes 02/07/2012   Multiple sclerosis, 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 11/26/2011   Hypothyroidism    HLD (hyperlipidemia)     Orientation RESPIRATION BLADDER Height & Weight     Self, Time, Situation, Place  Normal Incontinent Weight: 192 lb 14.4 oz (87.5 kg) Height:  '5\' 3"'$  (160 cm)  BEHAVIORAL SYMPTOMS/MOOD NEUROLOGICAL BOWEL NUTRITION STATUS  Continent Diet (see discharge summary)  AMBULATORY STATUS COMMUNICATION OF NEEDS Skin   Extensive Assist (able to sit/stand, but  w/c and bed bound) Verbally Normal                       Personal Care Assistance Level of Assistance  Bathing, Dressing Bathing Assistance: Maximum assistance   Dressing Assistance: Maximum assistance     Functional Limitations Info  Sight Sight Info: Impaired        SPECIAL CARE FACTORS FREQUENCY  PT (By licensed PT), OT (By licensed OT)     PT Frequency: 4-5x/wk OT Frequency: 4-5x/wk            Contractures Contractures Info: Not present    Additional Factors Info  Code Status, Allergies Code Status Info: FULL Allergies Info: Chlorhexidine ; Hydrocodone;Hydrocodone ;Oxycodone ;Sertraline;Macrobid (nitrofurantoin) ; Phenol ;Rofecoxib           Current Medications (07/16/2022):  This is the current hospital active medication list Current Facility-Administered Medications  Medication Dose Route Frequency Provider Last Rate Last Admin   acetaminophen (TYLENOL) tablet 650 mg  650 mg Oral Q6H PRN Lenore Cordia, MD   650 mg at 07/15/22 2051   Or   acetaminophen (TYLENOL) suppository 650 mg  650 mg Rectal Q6H PRN Lenore Cordia, MD       ALPRAZolam Duanne Moron) tablet 0.5 mg  0.5 mg Oral TID PRN Zada Finders R, MD   0.5 mg at 07/15/22 2051   apixaban (ELIQUIS) tablet 5 mg  5 mg Oral BID Zada Finders R, MD   5 mg at 07/16/22 0835   baclofen (LIORESAL) tablet 10 mg  10 mg Oral TID PRN [name], MD   10 mg at 07/15/22 2050   buPROPion (WELLBUTRIN XL) 24 hr tablet 150 mg  150 mg Oral Daily [name], MD   150 mg at 07/16/22 0835   escitalopram (LEXAPRO) tablet 10 mg  10 mg Oral QHS [name], MD   10 mg at 07/15/22 2051   gabapentin (NEURONTIN) capsule 300 mg  300 mg Oral QID Zada Finders R, MD   300 mg at 07/16/22 0835   levothyroxine (SYNTHROID) tablet 75 mcg  75 mcg Oral Q0600 Lenore Cordia, MD   75 mcg at 07/16/22 0521   melatonin tablet 5 mg  5 mg Oral QHS [name], MD   5 mg at 07/15/22 2051   meropenem (MERREM) 1 g in  sodium chloride 0.9 % 100 mL IVPB  1 g Intravenous Q8H Kaleen Mask, RPH 200 mL/hr at 07/16/22 0521 1 g at 07/16/22 0521   metoprolol succinate (TOPROL-XL) 24 hr tablet 12.5 mg  12.5 mg Oral Daily [name], MD   12.5 mg at 07/15/22 0857   midodrine (PROAMATINE) tablet 7.5 mg  7.5 mg Oral TID WC [name], MD   7.5 mg at 07/16/22 0833   ondansetron (ZOFRAN) tablet 4 mg  4 mg Oral Q6H PRN Lenore Cordia, MD   4 mg at 07/15/22 2050   Or   ondansetron (ZOFRAN) injection 4 mg  4 mg Intravenous Q6H PRN Lenore Cordia, MD       oxybutynin (DITROPAN) tablet 5 mg  5 mg Oral TID PRN [name], MD       phenazopyridine (PYRIDIUM) tablet 100 mg  100 mg Oral TID PRN [name], MD       polyethylene glycol (  MIRALAX / GLYCOLAX) packet 17 g  17 g Oral Daily [name], MD   17 g at 07/16/22 2197   senna-docusate (Senokot-S) tablet 2 tablet  2 tablet Oral QHS [name], MD   2 tablet at 07/15/22 2051   simvastatin (ZOCOR) tablet 10 mg  10 mg Oral QHS Zada Finders R, MD   10 mg at 07/15/22 2051   sodium chloride flush (NS) 0.9 % injection 3 mL  3 mL Intravenous Q12H Zada Finders R, MD   3 mL at 07/15/22 0859   torsemide (DEMADEX) tablet 40 mg  40 mg Oral Daily [name], MD   40 mg at 07/16/22 5883     Discharge Medications: Please see discharge summary for a list of discharge medications.  Relevant Imaging Results:  Relevant Lab Results:   Additional Information [phone], COVID moderna 08/19/2019, 09/16/2019  [name], LCSWA

## 2022-07-16 NOTE — Progress Notes (Signed)
PROGRESS NOTE        PATIENT DETAILS Name: Toni [name] Age: 76 y.o. Sex: female Date of Birth: [birth year] Admit Date: 07/13/2022 Admitting Physician Evalee Mutton Kristeen Mans, MD FGH:[id number], Brandt Loosen, MD  Brief Summary: Patient is a 76 y.o.  female with history of multiple sclerosis-bed to wheelchair bound, chronic HFpEF, PAF on Eliquis, OSA on BiPAP-who was sent from SNF for lethargy/hyponatremia.  Apparently patient developed ileus several days ago-was placed on liquid diet, and was given numerous enemas/suppositories with profuse bowel movement.  Significant events: 11/23>> admit to [facility].  Hyponatremia/lethargy.  Significant studies: 11/23>> CT abdomen/pelvis: No SBO/ileus, mild bilateral hydronephrosis. 11/23>> CXR: No PNA  Significant microbiology data: 11/23>> COVID/influenza PCR: Negative 11/23>> blood culture: No growth 11/23 urine culture: ESBL E. coli  Procedures: None  Consults: None  Subjective: Sleeping comfortably in bed-no major issues overnight per nursing staff.  Patient denies any chest pain or shortness of breath.  Objective: Vitals: Blood pressure (!) 98/48, pulse 75, temperature 97.8 F (36.6 C), temperature source Oral, resp. rate 16, height '5\' 3"'$  (1.6 m), weight 87.5 kg, SpO2 95 %.   Exam: Gen Exam:Alert awake-not in any distress HEENT:atraumatic, normocephalic Chest: B/L clear to auscultation anteriorly CVS:S1S2 regular Abdomen:soft non tender, non distended Extremities:no edema Neurology: Non focal Skin: no rash  Pertinent Labs/Radiology:    Latest Ref Rng & Units 07/16/2022    3:19 AM 07/14/2022    4:50 AM 07/13/2022    8:30 PM  CBC  WBC 4.0 - 10.5 K/uL 7.1  7.5  6.7   Hemoglobin 12.0 - 15.0 g/dL 11.8  12.2  13.0   Hematocrit 36.0 - 46.0 % 34.2  35.9  37.9   Platelets 150 - 400 K/uL 181  158  157     Lab Results  Component Value Date   NA 137 07/16/2022   K 3.5 07/16/2022   CL 93 (L) 07/16/2022   CO2  31 07/16/2022      Assessment/Plan: Hyponatremia  Suspect this was acute-and was due to patient being on predominantly clear liquid diet/bowel purge with profuse watery stools. Sodium level stable  ESBL E. coli UTI Acknowledges frequency/dysuria-better after treatment. Stable-afebrile-no leukocytosis On Rocephin-switch to meropenem on 11/25 Suspect this is mostly cystitis and without pyelonephritis.  Recent history of ileus Seems to have resolved CT imaging negative Maintain on bowel regimen with MiraLAX/senna  Chronic HFpEF No evidence of volume overload Per patient-she was recently placed on Entresto which she did not tolerate and this was stopped at [facility]. Continue Demadex/beta-blocker at reduced dose-given soft blood pressure  Permanent atrial fibrillation Rate controlled Continue low-dose Toprol given soft BP  Remains on Eliquis   Hypotension Chronic issue-asymptomatic-given bedbound status-suspect she is on soft at baseline.  Prior ACTH stim test 2021 was negative  BP remains stable on midodrine.  Continue to follow  HLD Continue statin  Hypothyroidism Continue Synthroid  Multiple sclerosis Baclofen pump in place Continue gabapentin Follow-up with neurology postdischarge. Mostly bed to wheelchair bound, but able to sit/stand Apparently gets PT/OT at SNF.  Depression/anxiety Resume Wellbutrin/Lexapro Continue Xanax  OSA BiPAP nightly  Obesity Estimated body mass index is 34.17 kg/m as calculated from the following:   Height as of this encounter: '5\' 3"'$  (1.6 m).   Weight as of this encounter: 87.5 kg.   Code status:   Code Status: Full Code  DVT Prophylaxis: apixaban (ELIQUIS) tablet 5 mg     Family Communication: None at bedside   Disposition Plan: Status is: Inpatient Remains inpatient appropriate because: ESBL E. coli UTI-on meropenem-not yet stable for discharge.   Planned Discharge Destination:Skilled nursing facility hopefully  11/27   Diet: Diet Order             Diet Heart Room service appropriate? Yes; Fluid consistency: Thin  Diet effective now                     Antimicrobial agents: Anti-infectives (From admission, onward)    Start     Dose/Rate Route Frequency Ordered Stop   07/15/22 1300  meropenem (MERREM) 1 g in sodium chloride 0.9 % 100 mL IVPB        1 g 200 mL/hr over 30 Minutes Intravenous Every 8 hours 07/15/22 1215     07/13/22 2145  cefTRIAXone (ROCEPHIN) 2 g in sodium chloride 0.9 % 100 mL IVPB  Status:  Discontinued        2 g 200 mL/hr over 30 Minutes Intravenous Every 24 hours 07/13/22 2141 07/15/22 1214        MEDICATIONS: Scheduled Meds:  apixaban  5 mg Oral BID   buPROPion  150 mg Oral Daily   escitalopram  10 mg Oral QHS   gabapentin  300 mg Oral QID   levothyroxine  75 mcg Oral Q0600   melatonin  5 mg Oral QHS   metoprolol succinate  12.5 mg Oral Daily   midodrine  7.5 mg Oral TID WC   polyethylene glycol  17 g Oral Daily   senna-docusate  2 tablet Oral QHS   simvastatin  10 mg Oral QHS   sodium chloride flush  3 mL Intravenous Q12H   torsemide  40 mg Oral Daily   Continuous Infusions:  meropenem (MERREM) IV 1 g (07/16/22 0521)   PRN Meds:.acetaminophen **OR** acetaminophen, ALPRAZolam, baclofen, ondansetron **OR** ondansetron (ZOFRAN) IV, oxybutynin, phenazopyridine   I have personally reviewed following labs and imaging studies  LABORATORY DATA: CBC: Recent Labs  Lab 07/13/22 2030 07/14/22 0450 07/16/22 0319  WBC 6.7 7.5 7.1  NEUTROABS 3.9  --   --   HGB 13.0 12.2 11.8*  HCT 37.9 35.9* 34.2*  MCV 91.3 91.8 91.0  PLT 157 158 181     Basic Metabolic Panel: Recent Labs  Lab 07/13/22 2030 07/14/22 0450 07/15/22 0718 07/16/22 0319  NA 125* 139 138 137  K 4.8 4.0 3.8 3.5  CL 88* 99 96* 93*  CO2 '26 29 31 31  '$ GLUCOSE 157* 75 83 83  BUN '13 9 11 15  '$ CREATININE 0.63 0.48 0.63 0.59  CALCIUM 8.2* 8.7* 8.9 8.9  MG 1.8  --   --   --       GFR: Estimated Creatinine Clearance: 63.7 mL/min (by C-G formula based on SCr of 0.59 mg/dL).  Liver Function Tests: Recent Labs  Lab 07/13/22 2030  AST 20  ALT 7  ALKPHOS 63  BILITOT 1.1  PROT 5.5*  ALBUMIN 3.0*    No results for input(s): "LIPASE", "AMYLASE" in the last 168 hours. No results for input(s): "AMMONIA" in the last 168 hours.  Coagulation Profile: Recent Labs  Lab 07/13/22 2030  INR 1.4*     Cardiac Enzymes: No results for input(s): "CKTOTAL", "CKMB", "CKMBINDEX", "TROPONINI" in the last 168 hours.  BNP (last 3 results) No results for input(s): "PROBNP" in the last 8760 hours.  Lipid  Profile: No results for input(s): "CHOL", "HDL", "LDLCALC", "TRIG", "CHOLHDL", "LDLDIRECT" in the last 72 hours.  Thyroid Function Tests: No results for input(s): "TSH", "T4TOTAL", "FREET4", "T3FREE", "THYROIDAB" in the last 72 hours.  Anemia Panel: No results for input(s): "VITAMINB12", "FOLATE", "FERRITIN", "TIBC", "IRON", "RETICCTPCT" in the last 72 hours.  Urine analysis:    Component Value Date/Time   COLORURINE YELLOW 07/13/2022 2148   APPEARANCEUR CLOUDY (A) 07/13/2022 2148   APPEARANCEUR Clear 12/18/2014 1156   LABSPEC 1.009 07/13/2022 2148   PHURINE 6.0 07/13/2022 2148   GLUCOSEU NEGATIVE 07/13/2022 2148   HGBUR SMALL (A) 07/13/2022 2148   BILIRUBINUR NEGATIVE 07/13/2022 2148   BILIRUBINUR Negative 12/18/2014 [place] 07/13/2022 2148   PROTEINUR NEGATIVE 07/13/2022 2148   UROBILINOGEN 0.2 08/05/2014 0951   NITRITE NEGATIVE 07/13/2022 2148   LEUKOCYTESUR LARGE (A) 07/13/2022 2148    Sepsis Labs: Lactic Acid, Venous    Component Value Date/Time   LATICACIDVEN 1.3 07/13/2022 2230    MICROBIOLOGY: Recent Results (from the past 240 hour(s))  Urine Culture     Status: Abnormal   Collection Time: 07/13/22  9:25 PM   Specimen: Urine, Clean Catch  Result Value Ref Range Status   Specimen Description URINE, CLEAN CATCH  Final    Special Requests   Final    NONE Performed at [facility], [place] [address] [place], [place] [address]    Culture (A)  Final    50,000 COLONIES/mL ESCHERICHIA COLI Confirmed Extended Spectrum Beta-Lactamase Producer (ESBL).  In bloodstream infections from ESBL organisms, carbapenems are preferred over piperacillin/tazobactam. They are shown to have a lower risk of mortality.    Report Status 07/15/2022 FINAL  Final   Organism ID, Bacteria ESCHERICHIA COLI (A)  Final      Susceptibility   Escherichia coli - MIC*    AMPICILLIN >=32 RESISTANT Resistant     CEFAZOLIN >=64 RESISTANT Resistant     CEFEPIME 8 INTERMEDIATE Intermediate     CEFTRIAXONE >=64 RESISTANT Resistant     CIPROFLOXACIN >=4 RESISTANT Resistant     GENTAMICIN <=1 SENSITIVE Sensitive     IMIPENEM <=0.25 SENSITIVE Sensitive     NITROFURANTOIN <=16 SENSITIVE Sensitive     TRIMETH/SULFA >=320 RESISTANT Resistant     AMPICILLIN/SULBACTAM 4 SENSITIVE Sensitive     PIP/TAZO <=4 SENSITIVE Sensitive     * 50,000 COLONIES/mL ESCHERICHIA COLI  Resp Panel by RT-PCR (Flu A&B, Covid) Anterior Nasal Swab     Status: None   Collection Time: 07/13/22  9:41 PM   Specimen: Anterior Nasal Swab  Result Value Ref Range Status   SARS Coronavirus 2 by RT PCR NEGATIVE NEGATIVE Final    Comment: (NOTE) SARS-CoV-2 target nucleic acids are NOT DETECTED.  The SARS-CoV-2 RNA is generally detectable in upper respiratory specimens during the acute phase of infection. The lowest concentration of SARS-CoV-2 viral copies this assay can detect is 138 copies/mL. A negative result does not preclude SARS-Cov-2 infection and should not be used as the sole basis for treatment or other patient management decisions. A negative result may occur with  improper specimen collection/handling, submission of specimen other than nasopharyngeal swab, presence of viral mutation(s) within the areas targeted by this assay, and inadequate number of  viral copies(<138 copies/mL). A negative result must be combined with clinical observations, patient history, and epidemiological information. The expected result is Negative.  Fact Sheet for Patients:  EntrepreneurPulse.com.au  Fact Sheet for Healthcare Providers:  IncredibleEmployment.be  This test is  no t yet approved or cleared by the [place] and  has been authorized for detection and/or diagnosis of SARS-CoV-2 by FDA under an Emergency Use Authorization (EUA). This EUA will remain  in effect (meaning this test can be used) for the duration of the COVID-19 declaration under Section 564(b)(1) of the Act, 21 U.S.C.section 360bbb-3(b)(1), unless the authorization is terminated  or revoked sooner.       Influenza A by PCR NEGATIVE NEGATIVE Final   Influenza B by PCR NEGATIVE NEGATIVE Final    Comment: (NOTE) The Xpert Xpress SARS-CoV-2/FLU/RSV plus assay is intended as an aid in the diagnosis of influenza from Nasopharyngeal swab specimens and should not be used as a sole basis for treatment. Nasal washings and aspirates are unacceptable for Xpert Xpress SARS-CoV-2/FLU/RSV testing.  Fact Sheet for Patients: EntrepreneurPulse.com.au  Fact Sheet for Healthcare Providers: IncredibleEmployment.be  This test is not yet approved or cleared by the [place] FDA and has been authorized for detection and/or diagnosis of SARS-CoV-2 by FDA under an Emergency Use Authorization (EUA). This EUA will remain in effect (meaning this test can be used) for the duration of the COVID-19 declaration under Section 564(b)(1) of the Act, 21 U.S.C. section 360bbb-3(b)(1), unless the authorization is terminated or revoked.  Performed at [facility], [place] [address], [place], [place] [address]   Blood Culture (routine x 2)     Status: None (Preliminary result)   Collection Time: 07/13/22 10:28 PM    Specimen: BLOOD RIGHT WRIST  Result Value Ref Range Status   Specimen Description BLOOD RIGHT WRIST  Final   Special Requests   Final    BOTTLES DRAWN AEROBIC ONLY Blood Culture results may not be optimal due to an inadequate volume of blood received in culture bottles   Culture   Final    NO GROWTH 3 DAYS Performed at [facility], [place] [address], [place], [place] [address]    Report Status PENDING  Incomplete  Blood Culture (routine x 2)     Status: None (Preliminary result)   Collection Time: 07/14/22  8:07 PM   Specimen: BLOOD  Result Value Ref Range Status   Specimen Description BLOOD RIGHT ANTECUBITAL  Final   Special Requests   Final    BOTTLES DRAWN AEROBIC AND ANAEROBIC Blood Culture adequate volume   Culture   Final    NO GROWTH 2 DAYS Performed at [facility], [place] [address], [place], [place] [address]    Report Status PENDING  Incomplete    RADIOLOGY STUDIES/RESULTS: No results found.   LOS: 2 days   Oren Binet, MD  [facility]    To contact the attending provider between 7A-7P or the covering provider during after hours 7P-7A, please log into the web site [email or web address] and access using universal [place] password for that web site. If you do not have the password, please call the hospital operator.  07/16/2022, 9:45 AM

## 2022-07-16 NOTE — TOC Initial Note (Signed)
Transition of Care [facility]) - Initial/Assessment Note    Patient Details  Name: Toni [name] MRN: [id number] Date of Birth: [birth year]  Transition of Care [facility]) CM/SW Contact:    Joanne Chars, LCSW Phone Number: 07/16/2022, 1:20 PM  Clinical Narrative:  Pt not available in room, CSW spoke with pt husband [name] in hallway. He confirmed that pt is from [facility].  Husband reports pt is in rehab there, prior note says assisted living.  Husband reports pt has been there for 18 months, they do plan for her to return.  Pt daughter [name] also involved.                   Expected Discharge Plan: Skilled Nursing Facility Barriers to Discharge: Continued Medical Work up   Patient Goals and CMS Choice     Choice offered to / list presented to : Spouse (husband [name])  Expected Discharge Plan and Services Expected Discharge Plan: [place] In-house Referral: Clinical Social Work   Post Acute Care Choice: [place] Living arrangements for the past 2 months: [place]                                      Prior Living Arrangements/Services Living arrangements for the past 2 months: [place] Lives with:: Facility Resident Patient language and need for interpreter reviewed:: No        Need for Family Participation in Patient Care: Yes (Comment) Care giver support system in place?: Yes (comment) Current home services: Homehealth aide Criminal Activity/Legal Involvement Pertinent to Current Situation/Hospitalization: No - Comment as needed  Activities of Daily Living   ADL Screening (condition at time of admission) Patient's cognitive ability adequate to safely complete daily activities?: Yes Is the patient deaf or have difficulty hearing?: No Does the patient have difficulty seeing, even when wearing glasses/contacts?: No Does the patient have difficulty concentrating, remembering, or making  decisions?: No Patient able to express need for assistance with ADLs?: Yes Does the patient have difficulty dressing or bathing?: Yes Independently performs ADLs?: No Communication: Independent Does the patient have difficulty walking or climbing stairs?: Yes Weakness of Legs: Both Weakness of Arms/Hands: Right  Permission Sought/Granted                  Emotional Assessment       Orientation: : Oriented to Self, Oriented to Place, Oriented to  Time, Oriented to Situation      Admission diagnosis:  Hyponatremia [E87.1] Acute cystitis without hematuria [N30.00] Patient Active Problem List   Diagnosis Date Noted   (HFpEF) heart failure with preserved ejection fraction ([place]) 07/14/2022   Hyponatremia 07/13/2022   Cushing's syndrome, ACTH dependent ([place]) 09/12/2021   Presence of intrathecal pump 08/18/2021   Spasticity 02/18/2021   Bladder spasms 01/04/2021   UTI (urinary tract infection) 01/03/2021   Bilateral carpal tunnel syndrome 10/04/2020   Multiple sclerosis, 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[place]) 09/13/2020   Chronic intermittent hypoxia with obstructive sleep apnea 09/13/2020   COVID-19 long hauler manifesting chronic dyspnea 09/13/2020   Chronic diastolic CHF (congestive heart failure) ([place]) 09/13/2020   Acute respiratory failure with hypoxia ([place]) 09/13/2020   Class 2 severe obesity with serious comorbidity and body mass index (BMI) of 35.0 to 35.9 in adult ([place]) 09/13/2020   Sepsis ([place]) 48/54/6270   Acute metabolic encephalopathy 35/00/9381   Atrial fibrillation with RVR ([place])  04/07/2020   Recurrent UTI 62/94/7654   Uncomplicated degenerative myopia of both eyes 03/19/2020   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/2020   Oxygen dependent 02/26/2020   Neurogenic hypoventilation 02/26/2020   Complex atypical endometrial hyperplasia    Acute on chronic respiratory failure with hypoxia ([place]) 10/10/2019   Multiple sclerosis ([place]) 10/10/2019   COVID-19 virus infection  08/23/2019   Bilateral leg weakness 08/04/2019   Weakness 08/04/2019   Unilateral primary osteoarthritis, left knee 01/09/2019   Obstructive sleep apnea treated with bilevel positive airway pressure (BiPAP) 08/15/2018   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 ([place]) 11/21/2017   Permanent atrial fibrillation ([place]) 11/21/2017   Tightness of leg fascia 11/21/2017   Muscle spasm of both lower legs 11/21/2017   Carpal tunnel syndrome, left upper limb 09/13/2017   Carpal tunnel syndrome, right upper limb 09/13/2017   Other insomnia 06/11/2017   Depression with anxiety 06/11/2017   Vitamin D deficiency 03/22/2017   Right hand weakness 08/06/2016   Nonischemic cardiomyopathy ([place]) 05/09/2016   Localized edema 05/09/2016   Long term current use of anticoagulant therapy 02/03/2016   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   Obesity hypoventilation syndrome ([place]) 03/30/2015   Abnormality of gait 03/01/2015   Chronic constipation with overflow incontinence 01/25/2015   ACTH dependent Cushing's syndrome ([place]) 01/25/2015   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) ([place]) 01/25/2015   Neurogenic urinary bladder disorder 08/24/2014   Varicose veins of both lower extremities with complications 65/10/5463   Pseudophakia of both eyes 02/07/2012   Multiple sclerosis, 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 11/26/2011   Hypothyroidism    HLD (hyperlipidemia)    PCP:  Jodi Marble, MD Pharmacy:   [place], [place] - [address] [place] [address] Phone: [phone] Fax: [phone]     Social Determinants of Health (SDOH) Interventions    Readmission Risk Interventions    06/11/2020    8:32 AM  Readmission Risk Prevention Plan  Transportation Screening Complete  PCP or Specialist appointment within 3-5 days of discharge Not Complete  PCP/Specialist  Appt Not Complete comments MD office reports they have to call the pt and schedule this appt themselves when they receive notice of [place] or [place] Complete  SW Recovery Care/Counseling Consult Complete  Palliative Care Screening Not Campus Not Applicable

## 2022-07-17 DIAGNOSIS — R252 Cramp and spasm: Secondary | ICD-10-CM | POA: Diagnosis not present

## 2022-07-17 DIAGNOSIS — R0600 Dyspnea, unspecified: Secondary | ICD-10-CM | POA: Diagnosis not present

## 2022-07-17 DIAGNOSIS — Z7901 Long term (current) use of anticoagulants: Secondary | ICD-10-CM | POA: Diagnosis not present

## 2022-07-17 DIAGNOSIS — G35 Multiple sclerosis: Secondary | ICD-10-CM | POA: Diagnosis not present

## 2022-07-17 DIAGNOSIS — R21 Rash and other nonspecific skin eruption: Secondary | ICD-10-CM | POA: Diagnosis not present

## 2022-07-17 DIAGNOSIS — N130 Hydronephrosis with ureteropelvic junction obstruction: Secondary | ICD-10-CM | POA: Diagnosis not present

## 2022-07-17 DIAGNOSIS — R2681 Unsteadiness on feet: Secondary | ICD-10-CM | POA: Diagnosis not present

## 2022-07-17 DIAGNOSIS — G4700 Insomnia, unspecified: Secondary | ICD-10-CM | POA: Diagnosis not present

## 2022-07-17 DIAGNOSIS — I959 Hypotension, unspecified: Secondary | ICD-10-CM | POA: Diagnosis not present

## 2022-07-17 DIAGNOSIS — Z79890 Hormone replacement therapy (postmenopausal): Secondary | ICD-10-CM | POA: Diagnosis not present

## 2022-07-17 DIAGNOSIS — N710 Acute inflammatory disease of uterus: Secondary | ICD-10-CM | POA: Diagnosis not present

## 2022-07-17 DIAGNOSIS — I4891 Unspecified atrial fibrillation: Secondary | ICD-10-CM | POA: Diagnosis not present

## 2022-07-17 DIAGNOSIS — I110 Hypertensive heart disease with heart failure: Secondary | ICD-10-CM | POA: Diagnosis not present

## 2022-07-17 DIAGNOSIS — J9611 Chronic respiratory failure with hypoxia: Secondary | ICD-10-CM | POA: Diagnosis not present

## 2022-07-17 DIAGNOSIS — E785 Hyperlipidemia, unspecified: Secondary | ICD-10-CM | POA: Diagnosis not present

## 2022-07-17 DIAGNOSIS — Z7401 Bed confinement status: Secondary | ICD-10-CM | POA: Diagnosis not present

## 2022-07-17 DIAGNOSIS — N3000 Acute cystitis without hematuria: Secondary | ICD-10-CM | POA: Diagnosis not present

## 2022-07-17 DIAGNOSIS — E871 Hypo-osmolality and hyponatremia: Secondary | ICD-10-CM | POA: Diagnosis not present

## 2022-07-17 DIAGNOSIS — K219 Gastro-esophageal reflux disease without esophagitis: Secondary | ICD-10-CM | POA: Diagnosis not present

## 2022-07-17 DIAGNOSIS — N8502 Endometrial intraepithelial neoplasia [EIN]: Secondary | ICD-10-CM | POA: Diagnosis not present

## 2022-07-17 DIAGNOSIS — Z7409 Other reduced mobility: Secondary | ICD-10-CM | POA: Diagnosis not present

## 2022-07-17 DIAGNOSIS — I5032 Chronic diastolic (congestive) heart failure: Secondary | ICD-10-CM | POA: Diagnosis not present

## 2022-07-17 DIAGNOSIS — N390 Urinary tract infection, site not specified: Secondary | ICD-10-CM | POA: Diagnosis not present

## 2022-07-17 DIAGNOSIS — N319 Neuromuscular dysfunction of bladder, unspecified: Secondary | ICD-10-CM | POA: Diagnosis not present

## 2022-07-17 DIAGNOSIS — E559 Vitamin D deficiency, unspecified: Secondary | ICD-10-CM | POA: Diagnosis not present

## 2022-07-17 DIAGNOSIS — R110 Nausea: Secondary | ICD-10-CM | POA: Diagnosis not present

## 2022-07-17 DIAGNOSIS — K5900 Constipation, unspecified: Secondary | ICD-10-CM | POA: Diagnosis not present

## 2022-07-17 DIAGNOSIS — I509 Heart failure, unspecified: Secondary | ICD-10-CM | POA: Diagnosis not present

## 2022-07-17 DIAGNOSIS — E240 Pituitary-dependent Cushing's disease: Secondary | ICD-10-CM | POA: Diagnosis not present

## 2022-07-17 DIAGNOSIS — E039 Hypothyroidism, unspecified: Secondary | ICD-10-CM | POA: Diagnosis not present

## 2022-07-17 DIAGNOSIS — Z741 Need for assistance with personal care: Secondary | ICD-10-CM | POA: Diagnosis not present

## 2022-07-17 DIAGNOSIS — B372 Candidiasis of skin and nail: Secondary | ICD-10-CM | POA: Diagnosis not present

## 2022-07-17 DIAGNOSIS — G4731 Primary central sleep apnea: Secondary | ICD-10-CM | POA: Diagnosis not present

## 2022-07-17 DIAGNOSIS — C649 Malignant neoplasm of unspecified kidney, except renal pelvis: Secondary | ICD-10-CM | POA: Diagnosis not present

## 2022-07-17 DIAGNOSIS — Z978 Presence of other specified devices: Secondary | ICD-10-CM | POA: Diagnosis not present

## 2022-07-17 DIAGNOSIS — R531 Weakness: Secondary | ICD-10-CM | POA: Diagnosis not present

## 2022-07-17 DIAGNOSIS — Z9989 Dependence on other enabling machines and devices: Secondary | ICD-10-CM | POA: Diagnosis not present

## 2022-07-17 DIAGNOSIS — G4733 Obstructive sleep apnea (adult) (pediatric): Secondary | ICD-10-CM | POA: Diagnosis not present

## 2022-07-17 DIAGNOSIS — F329 Major depressive disorder, single episode, unspecified: Secondary | ICD-10-CM | POA: Diagnosis not present

## 2022-07-17 DIAGNOSIS — R42 Dizziness and giddiness: Secondary | ICD-10-CM | POA: Diagnosis not present

## 2022-07-17 DIAGNOSIS — M6281 Muscle weakness (generalized): Secondary | ICD-10-CM | POA: Diagnosis not present

## 2022-07-17 DIAGNOSIS — R5381 Other malaise: Secondary | ICD-10-CM | POA: Diagnosis not present

## 2022-07-17 DIAGNOSIS — M1990 Unspecified osteoarthritis, unspecified site: Secondary | ICD-10-CM | POA: Diagnosis not present

## 2022-07-17 DIAGNOSIS — G8929 Other chronic pain: Secondary | ICD-10-CM | POA: Diagnosis not present

## 2022-07-17 DIAGNOSIS — F32A Depression, unspecified: Secondary | ICD-10-CM | POA: Diagnosis not present

## 2022-07-17 DIAGNOSIS — F411 Generalized anxiety disorder: Secondary | ICD-10-CM | POA: Diagnosis not present

## 2022-07-17 DIAGNOSIS — F419 Anxiety disorder, unspecified: Secondary | ICD-10-CM | POA: Diagnosis not present

## 2022-07-17 DIAGNOSIS — E119 Type 2 diabetes mellitus without complications: Secondary | ICD-10-CM | POA: Diagnosis not present

## 2022-07-17 MED ORDER — FOSFOMYCIN TROMETHAMINE 3 G PO PACK
3.0000 g | PACK | Freq: Once | ORAL | Status: AC
  Administered 2022-07-17: 3 g via ORAL
  Filled 2022-07-17: qty 3

## 2022-07-17 MED ORDER — METOPROLOL SUCCINATE ER 25 MG PO TB24: 12.5000 mg | ORAL_TABLET | Freq: Every day | ORAL | 3 refills | Status: DC

## 2022-07-17 MED ORDER — BISACODYL 10 MG RE SUPP: 10.0000 mg | RECTAL | 0 refills | Status: DC | PRN

## 2022-07-17 MED ORDER — LACTULOSE 10 GM/15ML PO SOLN: 10.0000 g | Freq: Two times a day (BID) | ORAL | 0 refills | Status: DC | PRN

## 2022-07-17 MED ORDER — ALPRAZOLAM 0.5 MG PO TABS: 0.5000 mg | ORAL_TABLET | Freq: Every day | ORAL | 0 refills | Status: AC

## 2022-07-17 MED ORDER — SENNOSIDES-DOCUSATE SODIUM 8.6-50 MG PO TABS: 2.0000 | ORAL_TABLET | Freq: Every day | ORAL | Status: DC

## 2022-07-17 MED ORDER — LINACLOTIDE 145 MCG PO CAPS: 145.0000 ug | ORAL_CAPSULE | Freq: Every day | ORAL | Status: DC

## 2022-07-17 MED ORDER — BISACODYL 10 MG RE SUPP: 10.0000 mg | Freq: Every day | RECTAL | Status: DC | PRN

## 2022-07-17 MED ORDER — LACTULOSE 10 GM/15ML PO SOLN: 10.0000 g | Freq: Two times a day (BID) | ORAL | Status: DC

## 2022-07-17 MED ORDER — POLYETHYLENE GLYCOL 3350 17 G PO PACK
17.0000 g | PACK | Freq: Two times a day (BID) | ORAL | Status: DC
  Administered 2022-07-17: 17 g via ORAL
  Filled 2022-07-17: qty 1

## 2022-07-17 MED ORDER — TORSEMIDE 20 MG PO TABS: 40.0000 mg | ORAL_TABLET | Freq: Every day | ORAL | Status: DC

## 2022-07-17 MED ORDER — FLEET ENEMA 7-19 GM/118ML RE ENEM: 1.0000 | ENEMA | Freq: Every day | RECTAL | Status: DC | PRN

## 2022-07-17 MED ORDER — POLYETHYLENE GLYCOL 3350 17 G PO PACK: 17.0000 g | PACK | Freq: Two times a day (BID) | ORAL | 0 refills | Status: DC

## 2022-07-17 MED ORDER — LACTULOSE 10 GM/15ML PO SOLN
30.0000 g | Freq: Once | ORAL | Status: AC
  Administered 2022-07-17: 30 g via ORAL
  Filled 2022-07-17: qty 45

## 2022-07-17 MED ORDER — MIDODRINE HCL 2.5 MG PO TABS: 7.5000 mg | ORAL_TABLET | Freq: Three times a day (TID) | ORAL | Status: DC

## 2022-07-17 MED ORDER — FLEET ENEMA 7-19 GM/118ML RE ENEM: 1.0000 | ENEMA | Freq: Every day | RECTAL | 0 refills | Status: DC | PRN

## 2022-07-17 NOTE — Progress Notes (Signed)
Report given to [facility]

## 2022-07-17 NOTE — TOC Transition Note (Signed)
Transition of Care [facility]) - CM/SW Discharge Note   Patient Details  Name: WILHELMINA HARK MRN: [id number] Date of Birth: 1946-03-02  Transition of Care [facility]) CM/SW Contact:  Joanne Chars, LCSW Phone Number: 07/17/2022, 10:09 AM   Clinical Narrative:   Pt discharging to [facility].  RN call report to [phone].     Final next level of care: Skilled Nursing Facility Barriers to Discharge: Barriers Resolved   Patient Goals and CMS Choice     Choice offered to / list presented to : Spouse (husband [name])  Discharge Placement              Patient chooses bed at:  [facility]) Patient to be transferred to facility by: [place] Name of family member notified: daughter [name] Patient and family notified of of transfer: 07/17/22  Discharge Plan and Services In-house Referral: Clinical Social Work   Post Acute Care Choice: [place]                               Social Determinants of Health (SDOH) Interventions     Readmission Risk Interventions    06/11/2020    8:32 AM  Readmission Risk Prevention Plan  Transportation Screening Complete  PCP or Specialist appointment within 3-5 days of discharge Not Complete  PCP/Specialist Appt Not Complete comments MD office reports they have to call the pt and schedule this appt themselves when they receive notice of [place] or [place] Complete  SW Recovery Care/Counseling Consult Complete  [place] Not Applicable

## 2022-07-17 NOTE — Discharge Summary (Signed)
PATIENT DETAILS Name: LAKEA MITTELMAN Age: 76 y.o. Sex: female Date of Birth: 04-Jul-1946 MRN: [id number]. Admitting Physician: [name], MD [id number], Brandt Loosen, MD  Admit Date: 07/13/2022 Discharge date: 07/17/2022  Recommendations for Outpatient Follow-up:  Follow up with PCP in 1-2 weeks Please obtain CMP/CBC in one week Please ensure follow-up with cardiology/neurology   Admitted From:  SNF  Disposition: Skilled nursing facility   Discharge Condition: fair  CODE STATUS:   Code Status: Full Code   Diet recommendation:  Diet Order             Diet - low sodium heart healthy           Diet Heart Room service appropriate? Yes; Fluid consistency: Thin  Diet effective now                    Brief Summary: Patient is a 76 y.o.  female with history of multiple sclerosis-bed to wheelchair bound, chronic HFpEF, PAF on Eliquis, OSA on BiPAP-who was sent from SNF for lethargy/hyponatremia.  Apparently patient developed ileus several days ago-was placed on liquid diet, and was given numerous enemas/suppositories with profuse bowel movement.   Significant events: 11/23>> admit to [facility].  Hyponatremia/lethargy.   Significant studies: 11/23>> CT abdomen/pelvis: No SBO/ileus, mild bilateral hydronephrosis. 11/23>> CXR: No PNA   Significant microbiology data: 11/23>> COVID/influenza PCR: Negative 11/23>> blood culture: No growth 11/23 urine culture: ESBL E. coli   Procedures: None   Consults: None  Brief Hospital Course: Hyponatremia  Suspect this was acute-and was due to patient being on predominantly clear liquid diet/bowel purge with profuse watery stools. Tolerating reinitiation of diuretics without any major issues-sodium levels stable. Awake/alert-clinically improved over the past several days   ESBL E. coli UTI Acknowledges frequency/dysuria-better after treatment. Stable-afebrile-no leukocytosis Initially on Rocephin-switch to meropenem  on 11/25 With ID pharmacist on 11/27-will give 1 dose of fosfomycin-does not need IV antibiotics on discharge. Suspect this is mostly cystitis and without pyelonephritis.   Recent history of ileus Seems to have resolved CT imaging negative  Chronic constipation Had diarrhea/ileus prior to this hospitalization Continue MiraLAX/senna/lactulose At patient's request-will resume Linzess Continue to use as needed Dulcolax/Fleet enema on discharge   Chronic HFpEF No evidence of volume overload Per patient-she was recently placed on Entresto which she did not tolerate and this was stopped at [facility]. Continue Demadex/beta-blocker Ensure follow-up with cardiology   Permanent atrial fibrillation Rate controlled Continue low-dose Toprol given soft BP  Remains on Eliquis    Hypotension Chronic issue-asymptomatic-given bedbound status-suspect she is on soft at baseline.  Prior ACTH stim test 2021 was negative  BP remains soft but stable on midodrine.  Continue to follow   HLD Continue statin   Hypothyroidism Continue Synthroid   Multiple sclerosis Baclofen pump in place Continue gabapentin Follow-up with neurology postdischarge. Mostly bed to wheelchair bound, but able to sit/stand Apparently gets PT/OT at SNF.   Depression/anxiety Continue Wellbutrin/Lexapro/ Xanax   OSA BiPAP nightly   Morbid Obesity: Estimated body mass index is 37.84 kg/m as calculated from the following:   Height as of this encounter: '5\' 3"'$  (1.6 m).   Weight as of this encounter: 96.9 kg.   Discharge Diagnoses:  Principal Problem:   Hyponatremia Active Problems:   UTI (urinary tract infection)   Permanent atrial fibrillation (HCC)   (HFpEF) heart failure with preserved ejection fraction (HCC)   Hypothyroidism   HLD (hyperlipidemia)   Depression with anxiety   Obstructive sleep  apnea treated with bilevel positive airway pressure (BiPAP)   Multiple sclerosis ([place])  Discharge  Instructions:  Activity:  As tolerated with Full fall precautions use walker/cane & assistance as needed  Discharge Instructions     (HEART FAILURE PATIENTS) Call MD:  Anytime you have any of the following symptoms: 1) 3 pound weight gain in 24 hours or 5 pounds in 1 week 2) shortness of breath, with or without a dry hacking cough 3) swelling in the hands, feet or stomach 4) if you have to sleep on extra pillows at night in order to breathe.   Complete by: As directed    Call MD for:  difficulty breathing, headache or visual disturbances   Complete by: As directed    Call MD for:  persistant nausea and vomiting   Complete by: As directed    Call MD for:  severe uncontrolled pain   Complete by: As directed    Diet - low sodium heart healthy   Complete by: As directed    Discharge instructions   Complete by: As directed    Gen Exam:Alert awake-not in any distress HEENT:atraumatic, normocephalic Chest: B/L clear to auscultation anteriorly CVS:S1S2 regular Abdomen:soft non tender, non distended Extremities:no edema Neurology: Non focal Skin: no rash   Increase activity slowly   Complete by: As directed       Allergies as of 07/17/2022       Reactions   Other Rash   Topical Surgical prep  - severe rash   Chlorhexidine Hives, Other (See Comments)   And, sores appear where applied   Hydrocodone Nausea And Vomiting, Other (See Comments)   Hydrocodone causes vomiting   Oxycodone Nausea And Vomiting, Other (See Comments)   Oral oxycodone causes vomiting   Sertraline Hives, Swelling, Rash, Other (See Comments)   [DERM][OTHER]RASH WITH SWELLING   Macrobid [nitrofurantoin]    Other reaction(s): 3.9.17 nausea and vomitting, NOT DOCUMENTED ON THE MAR   Phenol Hives   NOT DOCUMENTED ON THE MAR   Rofecoxib    Other reaction(s): Unknown NOT DOCUMENTED ON THE MAR        Medication List     STOP taking these medications    docusate sodium 100 MG capsule Commonly known as:  COLACE   Entresto 24-26 MG Generic drug: sacubitril-valsartan       TAKE these medications    acetaminophen 500 MG tablet Commonly known as: TYLENOL Take 1,000 mg by mouth every 6 (six) hours as needed (fever/headache/pain).   Acthar 80 UNIT/ML injectable gel Generic drug: corticotropin Inject 1 mL (80 Units total) into the skin every other day for 10 days. May give additional doses PRN. What changed:  when to take this reasons to take this additional instructions   ALPRAZolam 0.5 MG tablet Commonly known as: XANAX Take 1 tablet (0.5 mg total) by mouth at bedtime. May also take 1 tablet every 12 hours as needed anxiety What changed: See the new instructions.   Artificial Tears 0.2-0.2-1 % Soln Generic drug: Glycerin-Hypromellose-PEG 400 Place 2 drops into both eyes daily as needed (for dry eyes).   baclofen 20 MG tablet Commonly known as: LIORESAL Take 20 mg by mouth daily as needed (for mutiple sclerosis).   Benzethonium Chloride 0.13 % Liqd Apply 1 Application topically in the morning, at noon, and at bedtime. Use on groin area before to application of nystatin powder   Biotin 10000 MCG Tabs Take 10,000 mcg by mouth daily after lunch.   bisacodyl 10 MG  suppository Commonly known as: DULCOLAX Place 10 mg rectally daily as needed for moderate constipation.   buPROPion 150 MG 24 hr tablet Commonly known as: WELLBUTRIN XL Take 1 tablet (150 mg total) by mouth daily. What changed: additional instructions   Calcium Carbonate-Vitamin D3 600-400 MG-UNIT Tabs Take 1 tablet by mouth in the morning and at bedtime. (After breakfast & after lunch)   cetirizine 10 MG tablet Commonly known as: ZYRTEC Take 10 mg by mouth daily as needed for allergies.   clotrimazole 1 % cream Commonly known as: LOTRIMIN Apply 1 Application topically 2 (two) times daily. Mix with the ordered 1% hydrocortisone cream 1:1. Apply to perineal region and abdominal folds.   COQ10 PO Take 100  mg by mouth daily in the afternoon.   D-Mannose 500 MG Caps Take 500 mg by mouth every morning.   diclofenac Sodium 1 % Gel Commonly known as: Voltaren Apply 2 g topically 3 (three) times daily as needed. What changed: reasons to take this   Eliquis 5 MG Tabs tablet Generic drug: apixaban Take 5 mg by mouth 2 (two) times daily.   EPICERAM EX Apply 1 Application topically every morning. Apply to affected areas of groin and inguinal creases   escitalopram 10 MG tablet Commonly known as: Lexapro Take 1 tablet (10 mg total) by mouth daily. What changed: when to take this   famotidine 20 MG tablet Commonly known as: PEPCID Take 20 mg by mouth at bedtime.   gabapentin 300 MG capsule Commonly known as: NEURONTIN Mrs. Machi will take 300 mg of gabapentin at 6 AM, 10 AM, 2 PM and 6 PM and 600 mg at 10 PM by mouth. What changed:  how much to take how to take this when to take this additional instructions   hydrocortisone cream 1 % Apply 1 Application topically 2 (two) times daily. Mix 1:1 with clotrimazole cream   lactulose 10 GM/15ML solution Commonly known as: CHRONULAC Take 15 mLs (10 g total) by mouth 2 (two) times daily as needed for mild constipation. What changed: reasons to take this   levothyroxine 75 MCG tablet Commonly known as: SYNTHROID Take 75 mcg by mouth daily before breakfast. (0500)   linaclotide 145 MCG Caps capsule Commonly known as: LINZESS Take 1 capsule (145 mcg total) by mouth daily before breakfast. Start taking on: July 18, 2022   melatonin 5 MG Tabs Take 5 mg by mouth at bedtime.   metolazone 2.5 MG tablet Commonly known as: ZAROXOLYN Take 2.5 mg by mouth daily as needed (for weight gain greater than 3 lbs in 2 days with swelling).   metoprolol succinate 25 MG 24 hr tablet Commonly known as: TOPROL-XL Take 0.5 tablets (12.5 mg total) by mouth daily. Take with or immediately following a meal. What changed:  medication strength how  much to take   midodrine 2.5 MG tablet Commonly known as: PROAMATINE Take 3 tablets (7.5 mg total) by mouth 3 (three) times daily with meals.   Motegrity 2 MG Tabs Generic drug: Prucalopride Succinate Take 1 tablet (2 mg total) by mouth daily.   multivitamin with minerals Tabs tablet Take 1 tablet by mouth daily after lunch.   NON FORMULARY ASV breathing machine is to be used while napping & at bedtime. Fill canister up to max line with distilled water and turn concentrator to 5LPM.   Nyamyc powder Generic drug: nystatin Apply topically.   omeprazole 40 MG capsule Commonly known as: PRILOSEC Take 1 capsule (40 mg total) by  mouth every morning.   ondansetron 4 MG disintegrating tablet Commonly known as: ZOFRAN-ODT Take 4 mg by mouth in the morning, at noon, in the evening, and at bedtime.   oxybutynin 5 MG tablet Commonly known as: DITROPAN Take 5 mg by mouth 3 (three) times daily as needed for bladder spasms.   phenazopyridine 100 MG tablet Commonly known as: PYRIDIUM Take 100 mg by mouth 3 (three) times daily as needed for pain.   phenylephrine-shark liver oil-mineral oil-petrolatum 0.25-14-74.9 % rectal ointment Commonly known as: PREPARATION H Place 1 Application rectally every 6 (six) hours as needed for hemorrhoids.   polyethylene glycol 17 g packet Commonly known as: MIRALAX / GLYCOLAX Take 17 g by mouth 2 (two) times daily.   potassium chloride SA 20 MEQ tablet Commonly known as: KLOR-CON M Take 2 tablets (40 mEq total) by mouth daily. What changed:  how much to take when to take this additional instructions   promethazine 12.5 MG tablet Commonly known as: PHENERGAN Take 12.5 mg by mouth every 8 (eight) hours.   psyllium 28 % packet Commonly known as: METAMUCIL SMOOTH TEXTURE Take 1 packet by mouth 2 (two) times daily.   senna-docusate 8.6-50 MG tablet Commonly known as: Senokot-S Take 2 tablets by mouth at bedtime.   Senna-Time 8.6 MG  tablet Generic drug: senna Take 2 tablets by mouth 2 (two) times daily.   simvastatin 10 MG tablet Commonly known as: ZOCOR Take 10 mg by mouth at bedtime.   sodium phosphate 7-19 GM/118ML Enem Place 1 enema rectally daily as needed for severe constipation.   SYRINGE/NEEDLE (DISP) 1 ML 25G X 5/8" 1 ML Misc Commonly known as: BD Luer-Lok Syringe Use as directed to inject  Acthar   torsemide 20 MG tablet Commonly known as: DEMADEX Take 2 tablets (40 mg total) by mouth daily. What changed:  medication strength how much to take when to take this   Vitamin D3 50 MCG (2000 UT) Tabs Take 2,000 Units by mouth in the morning and at bedtime. (After breakfast & after lunch)   zinc oxide 20 % ointment Apply 1 application topically as needed for irritation (apply labia every shift as needed for redness).        Follow-up Information     Jodi Marble, MD. Schedule an appointment as soon as possible for a visit in 1 week(s).   Specialty: Internal Medicine Contact information: [place] [address] [phone]                Allergies  Allergen Reactions   Other Rash    Topical Surgical prep  - severe rash   Chlorhexidine Hives and Other (See Comments)    And, sores appear where applied   Hydrocodone Nausea And Vomiting and Other (See Comments)    Hydrocodone causes vomiting    Oxycodone Nausea And Vomiting and Other (See Comments)    Oral oxycodone causes vomiting   Sertraline Hives, Swelling, Rash and Other (See Comments)    [DERM][OTHER]RASH WITH SWELLING   Macrobid [Nitrofurantoin]     Other reaction(s): 3.9.17 nausea and vomitting,  NOT DOCUMENTED ON THE MAR   Phenol Hives    NOT DOCUMENTED ON THE MAR   Rofecoxib     Other reaction(s): Unknown  NOT DOCUMENTED ON THE MAR     Other Procedures/Studies: DG Chest Port 1 View  Result Date: 07/13/2022 CLINICAL DATA:  Possible sepsis. EXAM: PORTABLE CHEST 1 VIEW COMPARISON:   03/07/2022. FINDINGS: The heart  is enlarged the mediastinal contour is stable. Atherosclerotic calcification of the aorta is noted. No consolidation, effusion, or pneumothorax. Cervical spinal fusion hardware is noted. IMPRESSION: No active disease. Electronically Signed   By: Brett Fairy M.D.   On: 07/13/2022 22:10   CT ABDOMEN PELVIS W CONTRAST  Result Date: 07/13/2022 CLINICAL DATA:  Abdominal pain, acute, nonlocalized abdominal pain EXAM: CT ABDOMEN AND PELVIS WITH CONTRAST TECHNIQUE: Multidetector CT imaging of the abdomen and pelvis was performed using the standard protocol following bolus administration of intravenous contrast. RADIATION DOSE REDUCTION: This exam was performed according to the departmental dose-optimization program which includes automated exposure control, adjustment of the mA and/or kV according to patient size and/or use of iterative reconstruction technique. CONTRAST:  65m OMNIPAQUE IOHEXOL 350 MG/ML SOLN COMPARISON:  03/08/2019 FINDINGS: Lower chest: No acute abnormality Hepatobiliary: No focal hepatic abnormality. Gallbladder unremarkable. Pancreas: No focal abnormality or ductal dilatation. Spleen: No focal abnormality.  Normal size. Adrenals/Urinary Tract: Adrenal glands normal. Mild bilateral hydronephrosis, similar to prior study. No visible renal or ureteral stones. Bladder wall thickening similar to prior study. Stomach/Bowel: Stomach, large and small bowel grossly unremarkable. Vascular/Lymphatic: No evidence of aneurysm or adenopathy. Scattered aortic calcifications. Reproductive: Uterus and adnexa unremarkable. No mass. IUD within the uterus. Other: No free fluid or free air. Musculoskeletal: No acute bony abnormality. IMPRESSION: Stable mild bilateral hydronephrosis to the level of the bladder. No visible obstructing stones. Continued bladder wall thickening which may be related to cystitis. Electronically Signed   By: KRolm BaptiseM.D.   On: 07/13/2022 21:13    ECHOCARDIOGRAM COMPLETE  Result Date: 06/22/2022    ECHOCARDIOGRAM REPORT   Patient Name:   ELUCILIA YANNIHILL Date of Exam: 06/22/2022 Medical Rec #:  [id number]       Height:       63.0 in Accession #:    [id number]      Weight:       193.0 lb Date of Birth:  [birth year]        BSA:          1.905 m Patient Age:    [age]years         BP:           127/81 mmHg Patient Gender: F                HR:           63 bpm. Exam Location:  Outpatient Procedure: 2D Echo, 3D Echo, Strain Analysis, Color Doppler and Cardiac Doppler Indications:    Bilateral Leg Edema  History:        Patient has prior history of Echocardiogram examinations, most                 recent 06/10/2020. CHF, Arrythmias:Atrial Fibrillation; Risk                 Factors:Former Smoker, Hypertension, Diabetes and Dyslipidemia.  Sonographer:    JLeavy CellaRDCS Referring Phys: [id number] C HILTY IMPRESSIONS  1. Left ventricular ejection fraction, by estimation, is 45 to 50%. The left ventricle has mildly decreased function. The left ventricle demonstrates global hypokinesis. There is mild left ventricular hypertrophy. Left ventricular diastolic function could not be evaluated.  2. Right ventricular systolic function is normal. The right ventricular size is normal.  3. The mitral valve is normal in structure. Trivial mitral valve regurgitation. No evidence of mitral stenosis.  4. The aortic valve is grossly normal. Aortic  valve regurgitation is not visualized. No aortic stenosis is present.  5. The inferior vena cava is normal in size with greater than 50% respiratory variability, suggesting right atrial pressure of 3 mmHg. Comparison(s): Prior images reviewed side by side. Changes from prior study are noted. LVEF slightly decreased compared to prior. Conclusion(s)/Recommendation(s): LVEF mildly reduced with global hypokinesis. FINDINGS  Left Ventricle: Left ventricular ejection fraction, by estimation, is 45 to 50%. The left ventricle has mildly  decreased function. The left ventricle demonstrates global hypokinesis. 3D left ventricular ejection fraction analysis performed but not reported based on interpreter judgement due to suboptimal tracking. The left ventricular internal cavity size was normal in size. There is mild left ventricular hypertrophy. Left ventricular diastolic function could not be evaluated due to atrial fibrillation. Left ventricular diastolic function could not be evaluated. Right Ventricle: The right ventricular size is normal. Right vetricular wall thickness was not well visualized. Right ventricular systolic function is normal. Left Atrium: Left atrial size was normal in size. Right Atrium: Right atrial size was normal in size. Pericardium: There is no evidence of pericardial effusion. Mitral Valve: The mitral valve is normal in structure. Trivial mitral valve regurgitation. No evidence of mitral valve stenosis. Tricuspid Valve: The tricuspid valve is normal in structure. Tricuspid valve regurgitation is trivial. No evidence of tricuspid stenosis. Aortic Valve: The aortic valve is grossly normal. Aortic valve regurgitation is not visualized. No aortic stenosis is present. Pulmonic Valve: The pulmonic valve was grossly normal. Pulmonic valve regurgitation is trivial. No evidence of pulmonic stenosis. Aorta: The aortic arch was not well visualized and the aortic root and ascending aorta are structurally normal, with no evidence of dilitation. Venous: The inferior vena cava is normal in size with greater than 50% respiratory variability, suggesting right atrial pressure of 3 mmHg. IAS/Shunts: The interatrial septum was not well visualized.  LEFT VENTRICLE PLAX 2D LVIDd:         4.75 cm   Diastology LVIDs:         3.59 cm   LV e' medial:    13.70 cm/s LV PW:         1.51 cm   LV E/e' medial:  6.5 LV IVS:        0.90 cm   LV e' lateral:   15.20 cm/s LVOT diam:     1.80 cm   LV E/e' lateral: 5.9 LV SV:         33 LV SV Index:   17        2D  Longitudinal Strain LVOT Area:     2.54 cm  2D Strain GLS (A2C):   -14.0 %                          2D Strain GLS (A3C):   -10.8 %                          2D Strain GLS (A4C):   -8.9 %                          2D Strain GLS Avg:     -11.3 %                           3D Volume EF:  3D EF:        30 %                          LV EDV:       105 ml                          LV ESV:       73 ml                          LV SV:        32 ml RIGHT VENTRICLE RV Basal diam:  2.58 cm RV Mid diam:    2.42 cm RV S prime:     10.20 cm/s TAPSE (M-mode): 1.8 cm LEFT ATRIUM           Index        RIGHT ATRIUM           Index LA diam:      3.40 cm 1.79 cm/m   RA Area:     18.30 cm LA Vol (A2C): 41.0 ml 21.53 ml/m  RA Volume:   43.50 ml  22.84 ml/m LA Vol (A4C): 57.5 ml 30.19 ml/m  AORTIC VALVE LVOT Vmax:   70.20 cm/s LVOT Vmean:  45.100 cm/s LVOT VTI:    0.130 m  AORTA Ao Root diam: 3.00 cm Ao Asc diam:  2.90 cm MITRAL VALVE MV Area (PHT): 3.85 cm    SHUNTS MV Decel Time: 197 msec    Systemic VTI:  0.13 m MV E velocity: 89.10 cm/s  Systemic Diam: 1.80 cm MV A velocity: 27.50 cm/s MV E/A ratio:  3.24 Buford Dresser MD Electronically signed by Buford Dresser MD Signature Date/Time: 06/22/2022/6:26:39 PM    Final      TODAY-DAY OF DISCHARGE:  Subjective:   Lacy Duverney today has no headache,no chest abdominal pain,no new weakness tingling or numbness, feels much better wants to go home today.   Objective:   Blood pressure (!) 91/49, pulse 62, temperature 98.1 F (36.7 C), temperature source Oral, resp. rate 11, height '5\' 3"'$  (1.6 m), weight 96.9 kg, SpO2 100 %.  Intake/Output Summary (Last 24 hours) at 07/17/2022 0917 Last data filed at 07/17/2022 0630 Gross per 24 hour  Intake 500 ml  Output 2800 ml  Net -2300 ml   Filed Weights   07/13/22 1930 07/17/22 0500  Weight: 87.5 kg 96.9 kg    Exam: Awake Alert, Oriented *3, No new F.N deficits, Normal  affect [place].AT,PERRAL Supple Neck,No JVD, No cervical lymphadenopathy appriciated.  Symmetrical Chest wall movement, Good air movement bilaterally, CTAB RRR,No Gallops,Rubs or new Murmurs, No Parasternal Heave +ve B.Sounds, Abd Soft, Non tender, No organomegaly appriciated, No rebound -guarding or rigidity. No Cyanosis, Clubbing or edema, No new Rash or bruise   PERTINENT RADIOLOGIC STUDIES: No results found.   PERTINENT LAB RESULTS: CBC: Recent Labs    07/16/22 0319  WBC 7.1  HGB 11.8*  HCT 34.2*  PLT 181   CMET CMP     Component Value Date/Time   NA 137 07/16/2022 0319   NA 144 12/15/2020 1100   K 3.5 07/16/2022 0319   CL 93 (L) 07/16/2022 0319   CO2 31 07/16/2022 0319   GLUCOSE 83 07/16/2022 0319   BUN 15 07/16/2022 0319   BUN 27 12/15/2020 1100   CREATININE 0.59 07/16/2022 0319   CALCIUM  8.9 07/16/2022 0319   PROT 5.5 (L) 07/13/2022 2030   PROT 5.9 (L) 09/20/2020 1436   ALBUMIN 3.0 (L) 07/13/2022 2030   ALBUMIN 3.9 09/20/2020 1436   AST 20 07/13/2022 2030   ALT 7 07/13/2022 2030   ALKPHOS 63 07/13/2022 2030   BILITOT 1.1 07/13/2022 2030   BILITOT 0.3 09/20/2020 1436   GFRNONAA >60 07/16/2022 0319   GFRAA 90 09/20/2020 1436    GFR Estimated Creatinine Clearance: 67.3 mL/min (by C-G formula based on SCr of 0.59 mg/dL). No results for input(s): "LIPASE", "AMYLASE" in the last 72 hours. No results for input(s): "CKTOTAL", "CKMB", "CKMBINDEX", "TROPONINI" in the last 72 hours. Invalid input(s): "POCBNP" No results for input(s): "DDIMER" in the last 72 hours. No results for input(s): "HGBA1C" in the last 72 hours. No results for input(s): "CHOL", "HDL", "LDLCALC", "TRIG", "CHOLHDL", "LDLDIRECT" in the last 72 hours. No results for input(s): "TSH", "T4TOTAL", "T3FREE", "THYROIDAB" in the last 72 hours.  Invalid input(s): "FREET3" No results for input(s): "VITAMINB12", "FOLATE", "FERRITIN", "TIBC", "IRON", "RETICCTPCT" in the last 72 hours. Coags: No results  for input(s): "INR" in the last 72 hours.  Invalid input(s): "PT" Microbiology: Recent Results (from the past 240 hour(s))  Urine Culture     Status: Abnormal   Collection Time: 07/13/22  9:25 PM   Specimen: Urine, Clean Catch  Result Value Ref Range Status   Specimen Description URINE, CLEAN CATCH  Final   Special Requests   Final    NONE Performed at [facility], [place] [address], [place], [place] [address]    Culture (A)  Final    50,000 COLONIES/mL ESCHERICHIA COLI Confirmed Extended Spectrum Beta-Lactamase Producer (ESBL).  In bloodstream infections from ESBL organisms, carbapenems are preferred over piperacillin/tazobactam. They are shown to have a lower risk of mortality.    Report Status 07/15/2022 FINAL  Final   Organism ID, Bacteria ESCHERICHIA COLI (A)  Final      Susceptibility   Escherichia coli - MIC*    AMPICILLIN >=32 RESISTANT Resistant     CEFAZOLIN >=64 RESISTANT Resistant     CEFEPIME 8 INTERMEDIATE Intermediate     CEFTRIAXONE >=64 RESISTANT Resistant     CIPROFLOXACIN >=4 RESISTANT Resistant     GENTAMICIN <=1 SENSITIVE Sensitive     IMIPENEM <=0.25 SENSITIVE Sensitive     NITROFURANTOIN <=16 SENSITIVE Sensitive     TRIMETH/SULFA >=320 RESISTANT Resistant     AMPICILLIN/SULBACTAM 4 SENSITIVE Sensitive     PIP/TAZO <=4 SENSITIVE Sensitive     * 50,000 COLONIES/mL ESCHERICHIA COLI  Resp Panel by RT-PCR (Flu A&B, Covid) Anterior Nasal Swab     Status: None   Collection Time: 07/13/22  9:41 PM   Specimen: Anterior Nasal Swab  Result Value Ref Range Status   SARS Coronavirus 2 by RT PCR NEGATIVE NEGATIVE Final    Comment: (NOTE) SARS-CoV-2 target nucleic acids are NOT DETECTED.  The SARS-CoV-2 RNA is generally detectable in upper respiratory specimens during the acute phase of infection. The lowest concentration of SARS-CoV-2 viral copies this assay can detect is 138 copies/mL. A negative result does not preclude SARS-Cov-2 infection and should not  be used as the sole basis for treatment or other patient management decisions. A negative result may occur with  improper specimen collection/handling, submission of specimen other than nasopharyngeal swab, presence of viral mutation(s) within the areas targeted by this assay, and inadequate number of viral copies(<138 copies/mL). A negative result must be combined with clinical observations, patient  history, and epidemiological information. The expected result is Negative.  Fact Sheet for Patients:  EntrepreneurPulse.com.au  Fact Sheet for Healthcare Providers:  IncredibleEmployment.be  This test is no t yet approved or cleared by the [place] FDA and  has been authorized for detection and/or diagnosis of SARS-CoV-2 by FDA under an Emergency Use Authorization (EUA). This EUA will remain  in effect (meaning this test can be used) for the duration of the COVID-19 declaration under Section 564(b)(1) of the Act, 21 U.S.C.section 360bbb-3(b)(1), unless the authorization is terminated  or revoked sooner.       Influenza A by PCR NEGATIVE NEGATIVE Final   Influenza B by PCR NEGATIVE NEGATIVE Final    Comment: (NOTE) The Xpert Xpress SARS-CoV-2/FLU/RSV plus assay is intended as an aid in the diagnosis of influenza from Nasopharyngeal swab specimens and should not be used as a sole basis for treatment. Nasal washings and aspirates are unacceptable for Xpert Xpress SARS-CoV-2/FLU/RSV testing.  Fact Sheet for Patients: EntrepreneurPulse.com.au  Fact Sheet for Healthcare Providers: IncredibleEmployment.be  This test is not yet approved or cleared by the [place] FDA and has been authorized for detection and/or diagnosis of SARS-CoV-2 by FDA under an Emergency Use Authorization (EUA). This EUA will remain in effect (meaning this test can be used) for the duration of the COVID-19 declaration under Section  564(b)(1) of the Act, 21 U.S.C. section 360bbb-3(b)(1), unless the authorization is terminated or revoked.  Performed at [facility], [place] [address] [place], [place] [address]   Blood Culture (routine x 2)     Status: None (Preliminary result)   Collection Time: 07/13/22 10:28 PM   Specimen: BLOOD RIGHT WRIST  Result Value Ref Range Status   Specimen Description BLOOD RIGHT WRIST  Final   Special Requests   Final    BOTTLES DRAWN AEROBIC ONLY Blood Culture results may not be optimal due to an inadequate volume of blood received in culture bottles   Culture   Final    NO GROWTH 4 DAYS Performed at [facility], [place] [address], [place], [place] [address]    Report Status PENDING  Incomplete  Blood Culture (routine x 2)     Status: None (Preliminary result)   Collection Time: 07/14/22  8:07 PM   Specimen: BLOOD  Result Value Ref Range Status   Specimen Description BLOOD RIGHT ANTECUBITAL  Final   Special Requests   Final    BOTTLES DRAWN AEROBIC AND ANAEROBIC Blood Culture adequate volume   Culture   Final    NO GROWTH 3 DAYS Performed at [facility], [place] [address] [place], [place] [address]    Report Status PENDING  Incomplete    FURTHER DISCHARGE INSTRUCTIONS:  Get Medicines reviewed and adjusted: Please take all your medications with you for your next visit with your Primary MD  Laboratory/radiological data: Please request your Primary MD to go over all hospital tests and procedure/radiological results at the follow up, please ask your Primary MD to get all Hospital records sent to his/her office.  In some cases, they will be blood work, cultures and biopsy results pending at the time of your discharge. Please request that your primary care M.D. goes through all the records of your hospital data and follows up on these results.  Also Note the following: If you experience worsening of your admission symptoms, develop shortness of breath, life  threatening emergency, suicidal or homicidal thoughts you must seek medical attention immediately by  calling 911 or calling your MD immediately  if symptoms less severe.  You must read complete instructions/literature along with all the possible adverse reactions/side effects for all the Medicines you take and that have been prescribed to you. Take any new Medicines after you have completely understood and accpet all the possible adverse reactions/side effects.   Do not drive when taking Pain medications or sleeping medications (Benzodaizepines)  Do not take more than prescribed Pain, Sleep and Anxiety Medications. It is not advisable to combine anxiety,sleep and pain medications without talking with your primary care practitioner  Special Instructions: If you have smoked or chewed Tobacco  in the last 2 yrs please stop smoking, stop any regular Alcohol  and or any Recreational drug use.  Wear Seat belts while driving.  Please note: You were cared for by a hospitalist during your hospital stay. Once you are discharged, your primary care physician will handle any further medical issues. Please note that NO REFILLS for any discharge medications will be authorized once you are discharged, as it is imperative that you return to your primary care physician (or establish a relationship with a primary care physician if you do not have one) for your post hospital discharge needs so that they can reassess your need for medications and monitor your lab values.  Total Time spent coordinating discharge including counseling, education and face to face time equals greater than 30 minutes.  SignedOren Binet 07/17/2022 9:17 AM

## 2022-07-17 NOTE — Progress Notes (Signed)
Pt on day 4 w/o BM, on-call provider, J. Olena Heckle, NP made aware and stated he will inform AM provider to address today.

## 2022-07-17 NOTE — Progress Notes (Signed)
11/27 Patient has been discharged, IMM Letter will be mailed to patient's address in chart.

## 2022-07-17 NOTE — Plan of Care (Signed)

## 2022-07-17 NOTE — Discharge Instructions (Signed)
Recommendations for Outpatient Follow-up:  Follow up with PCP in 1-2 weeks Please obtain CMP/CBC in one week Please ensure follow-up with cardiology/neurology

## 2022-07-17 NOTE — Care Management Important Message (Signed)
Important Message  Patient Details  Name: Toni [name] MRN: [id number] Date of Birth: August 28, 1945   Medicare Important Message Given:  Other (see comment)     Hannah Beat 07/17/2022, 1:11 PM

## 2022-07-17 NOTE — Plan of Care (Signed)
  Problem: Coping: Goal: Level of anxiety will decrease Outcome: Progressing   Problem: Elimination: Goal: Will not experience complications related to bowel motility Outcome: Progressing Goal: Will not experience complications related to urinary retention Outcome: Progressing   Problem: Pain Managment: Goal: General experience of comfort will improve Outcome: Progressing   

## 2022-07-18 LAB — CULTURE, BLOOD (ROUTINE X 2): Culture: NO GROWTH

## 2022-07-19 DIAGNOSIS — I509 Heart failure, unspecified: Secondary | ICD-10-CM | POA: Diagnosis not present

## 2022-07-19 DIAGNOSIS — G35 Multiple sclerosis: Secondary | ICD-10-CM | POA: Diagnosis not present

## 2022-07-19 DIAGNOSIS — B372 Candidiasis of skin and nail: Secondary | ICD-10-CM | POA: Diagnosis not present

## 2022-07-19 DIAGNOSIS — E559 Vitamin D deficiency, unspecified: Secondary | ICD-10-CM | POA: Diagnosis not present

## 2022-07-19 DIAGNOSIS — R42 Dizziness and giddiness: Secondary | ICD-10-CM | POA: Diagnosis not present

## 2022-07-19 DIAGNOSIS — C649 Malignant neoplasm of unspecified kidney, except renal pelvis: Secondary | ICD-10-CM | POA: Diagnosis not present

## 2022-07-19 DIAGNOSIS — K5900 Constipation, unspecified: Secondary | ICD-10-CM | POA: Diagnosis not present

## 2022-07-19 DIAGNOSIS — R110 Nausea: Secondary | ICD-10-CM | POA: Diagnosis not present

## 2022-07-19 DIAGNOSIS — I4891 Unspecified atrial fibrillation: Secondary | ICD-10-CM | POA: Diagnosis not present

## 2022-07-19 DIAGNOSIS — R5381 Other malaise: Secondary | ICD-10-CM | POA: Diagnosis not present

## 2022-07-19 DIAGNOSIS — K219 Gastro-esophageal reflux disease without esophagitis: Secondary | ICD-10-CM | POA: Diagnosis not present

## 2022-07-19 DIAGNOSIS — N390 Urinary tract infection, site not specified: Secondary | ICD-10-CM | POA: Diagnosis not present

## 2022-07-19 LAB — CULTURE, BLOOD (ROUTINE X 2)
Culture: NO GROWTH
Special Requests: ADEQUATE

## 2022-07-20 ENCOUNTER — Encounter: Payer: Self-pay | Admitting: Gynecologic Oncology

## 2022-07-21 ENCOUNTER — Inpatient Hospital Stay: Payer: Medicare Other | Admitting: Gynecologic Oncology

## 2022-07-21 DIAGNOSIS — N8502 Endometrial intraepithelial neoplasia [EIN]: Secondary | ICD-10-CM

## 2022-07-24 DIAGNOSIS — I509 Heart failure, unspecified: Secondary | ICD-10-CM | POA: Diagnosis not present

## 2022-07-24 DIAGNOSIS — G4733 Obstructive sleep apnea (adult) (pediatric): Secondary | ICD-10-CM | POA: Diagnosis not present

## 2022-07-24 DIAGNOSIS — R21 Rash and other nonspecific skin eruption: Secondary | ICD-10-CM | POA: Diagnosis not present

## 2022-07-24 DIAGNOSIS — E785 Hyperlipidemia, unspecified: Secondary | ICD-10-CM | POA: Diagnosis not present

## 2022-07-24 DIAGNOSIS — I959 Hypotension, unspecified: Secondary | ICD-10-CM | POA: Diagnosis not present

## 2022-07-24 DIAGNOSIS — K5900 Constipation, unspecified: Secondary | ICD-10-CM | POA: Diagnosis not present

## 2022-07-24 DIAGNOSIS — N390 Urinary tract infection, site not specified: Secondary | ICD-10-CM | POA: Diagnosis not present

## 2022-07-24 DIAGNOSIS — E559 Vitamin D deficiency, unspecified: Secondary | ICD-10-CM | POA: Diagnosis not present

## 2022-07-24 DIAGNOSIS — E871 Hypo-osmolality and hyponatremia: Secondary | ICD-10-CM | POA: Diagnosis not present

## 2022-07-25 DIAGNOSIS — N130 Hydronephrosis with ureteropelvic junction obstruction: Secondary | ICD-10-CM | POA: Diagnosis not present

## 2022-07-25 DIAGNOSIS — N3000 Acute cystitis without hematuria: Secondary | ICD-10-CM | POA: Diagnosis not present

## 2022-07-28 DIAGNOSIS — C649 Malignant neoplasm of unspecified kidney, except renal pelvis: Secondary | ICD-10-CM | POA: Diagnosis not present

## 2022-07-28 DIAGNOSIS — E785 Hyperlipidemia, unspecified: Secondary | ICD-10-CM | POA: Diagnosis not present

## 2022-07-28 DIAGNOSIS — M1990 Unspecified osteoarthritis, unspecified site: Secondary | ICD-10-CM | POA: Diagnosis not present

## 2022-07-28 DIAGNOSIS — I4891 Unspecified atrial fibrillation: Secondary | ICD-10-CM | POA: Diagnosis not present

## 2022-07-28 DIAGNOSIS — I509 Heart failure, unspecified: Secondary | ICD-10-CM | POA: Diagnosis not present

## 2022-07-28 DIAGNOSIS — E039 Hypothyroidism, unspecified: Secondary | ICD-10-CM | POA: Diagnosis not present

## 2022-07-28 DIAGNOSIS — E559 Vitamin D deficiency, unspecified: Secondary | ICD-10-CM | POA: Diagnosis not present

## 2022-07-31 ENCOUNTER — Encounter: Payer: Self-pay | Admitting: Family Medicine

## 2022-07-31 ENCOUNTER — Ambulatory Visit (INDEPENDENT_AMBULATORY_CARE_PROVIDER_SITE_OTHER): Payer: No Typology Code available for payment source | Admitting: Neurology

## 2022-07-31 ENCOUNTER — Non-Acute Institutional Stay: Payer: Medicare Other | Admitting: Family Medicine

## 2022-07-31 VITALS — HR 85 | Temp 97.7°F | Resp 16 | Wt 213.6 lb

## 2022-07-31 DIAGNOSIS — Z978 Presence of other specified devices: Secondary | ICD-10-CM

## 2022-07-31 DIAGNOSIS — E039 Hypothyroidism, unspecified: Secondary | ICD-10-CM | POA: Diagnosis not present

## 2022-07-31 DIAGNOSIS — I5032 Chronic diastolic (congestive) heart failure: Secondary | ICD-10-CM | POA: Diagnosis not present

## 2022-07-31 DIAGNOSIS — N390 Urinary tract infection, site not specified: Secondary | ICD-10-CM | POA: Diagnosis not present

## 2022-07-31 DIAGNOSIS — I509 Heart failure, unspecified: Secondary | ICD-10-CM | POA: Diagnosis not present

## 2022-07-31 DIAGNOSIS — F329 Major depressive disorder, single episode, unspecified: Secondary | ICD-10-CM | POA: Diagnosis not present

## 2022-07-31 DIAGNOSIS — G8929 Other chronic pain: Secondary | ICD-10-CM | POA: Diagnosis not present

## 2022-07-31 DIAGNOSIS — E785 Hyperlipidemia, unspecified: Secondary | ICD-10-CM | POA: Diagnosis not present

## 2022-07-31 DIAGNOSIS — K219 Gastro-esophageal reflux disease without esophagitis: Secondary | ICD-10-CM | POA: Diagnosis not present

## 2022-07-31 DIAGNOSIS — N3000 Acute cystitis without hematuria: Secondary | ICD-10-CM

## 2022-07-31 DIAGNOSIS — G4700 Insomnia, unspecified: Secondary | ICD-10-CM | POA: Diagnosis not present

## 2022-07-31 DIAGNOSIS — I4891 Unspecified atrial fibrillation: Secondary | ICD-10-CM | POA: Diagnosis not present

## 2022-07-31 DIAGNOSIS — K5900 Constipation, unspecified: Secondary | ICD-10-CM | POA: Diagnosis not present

## 2022-07-31 DIAGNOSIS — R252 Cramp and spasm: Secondary | ICD-10-CM | POA: Diagnosis not present

## 2022-07-31 DIAGNOSIS — B372 Candidiasis of skin and nail: Secondary | ICD-10-CM | POA: Diagnosis not present

## 2022-07-31 DIAGNOSIS — E240 Pituitary-dependent Cushing's disease: Secondary | ICD-10-CM

## 2022-07-31 DIAGNOSIS — C649 Malignant neoplasm of unspecified kidney, except renal pelvis: Secondary | ICD-10-CM | POA: Diagnosis not present

## 2022-07-31 DIAGNOSIS — G35 Multiple sclerosis: Secondary | ICD-10-CM | POA: Diagnosis not present

## 2022-07-31 DIAGNOSIS — E871 Hypo-osmolality and hyponatremia: Secondary | ICD-10-CM | POA: Diagnosis not present

## 2022-07-31 DIAGNOSIS — R110 Nausea: Secondary | ICD-10-CM | POA: Diagnosis not present

## 2022-07-31 DIAGNOSIS — F411 Generalized anxiety disorder: Secondary | ICD-10-CM | POA: Diagnosis not present

## 2022-07-31 NOTE — Progress Notes (Signed)
RM 2, with husband. Skin prepped with Betadine and draped in sterile fashion. Center port of implanted pump accessed using 22g huber needle.  Residual fluid measuring  5.2 ml was removed from the reservoir.  Gablofen 20106mg/mlx2 instilled into pump. Pump reprogrammed to reflect a volume of 390m(2 ml would not go into pump, met resistance. Per MD, ok to reprogram to 38 ml and bring in week earlier for next refill).  ERI is 10/2027. Next appt given for 10/09/2022 at 2:30pm with Dr. SaFelecia ShellingWrote this down for pt to bring with her.   Medication placed in pump: Gablofen 40,00060m20ml x2. Lot: [id number]: 04/2024. [id number]

## 2022-07-31 NOTE — Progress Notes (Signed)
[facility]  PATIENT: Toni [name] DOB: Jul 04, 1946  REFERRING DOCTOR OR PCP: Dr. Brett Fairy SOURCE: Patient, notes from Dr. Brett Fairy and Dr. [name]  _________________________________   HISTORICAL  CHIEF COMPLAINT:  Chief Complaint  Patient presents with   Baclofen pump refill    HISTORY OF PRESENT ILLNESS:  Toni [name] is a 76 year old woman with multiple sclerosis.  She is followed primarily by Dr. Brett Fairy in the office.  She comes in today and would need a baclofen pump refill..  She had her pump replaced in 2022 (Dr. Vertell Limber).     She is in [facility] in [place].     She reports that her spasticity was a little worse during her recent hospitalization but she feels it is back to her baseline with the pump now.  She does take p.o. baclofen as needed when spasticity is a little worse.  She is written for the 20 mg pills and I discussed that if that makes her sleepy she can take half of the tablet several times a day.    She continues to get a benefit from the baclofen pump.  She is on third pump - first one 2009, last one July 2022).  Earlier this year, we increased the pump settings by 5% to 829.6 mcg/day.     She feels the current control her spasticity is doing well and is happy with the current pump settings and does not feel she needs to make any adjustment.  She is unable to walk but assist some with transfers.  She is able to move her legs in bed and to go on her side in bed.  She also notes weakness in her hands.  She has mild dysesthesias in her abdomen and legs.  Gabapentin has helped.     She has difficulty with a neurogenic bladder and has frequent UTIs.  Spasticity is worse when she is sick.  Vision is doing well. She wasreently hospitalized for UTI (not sepsis).     REVIEW OF SYSTEMS: Constitutional: No fevers, chills, sweats, or change in appetite Eyes: No visual changes, double vision, eye pain Ear, nose and throat: No hearing  loss, ear pain, nasal congestion, sore throat Cardiovascular: No chest pain, palpitations Respiratory:  No shortness of breath at rest or with exertion.   No wheezes GastrointestinaI: No nausea, vomiting, diarrhea, abdominal pain, fecal incontinence Genitourinary:  No dysuria, urinary retention or frequency.  No nocturia. Musculoskeletal:  No neck pain, back pain Integumentary: No rash, pruritus, skin lesions Neurological: as above Psychiatric: No depression at this time.  No anxiety Endocrine: No palpitations, diaphoresis, change in appetite, change in weigh or increased thirst Hematologic/Lymphatic:  No anemia, purpura, petechiae. Allergic/Immunologic: No itchy/runny eyes, nasal congestion, recent allergic reactions, rashes  ALLERGIES: Allergies  Allergen Reactions   Other Rash    Topical Surgical prep  - severe rash   Chlorhexidine Hives and Other (See Comments)    And, sores appear where applied   Hydrocodone Nausea And Vomiting and Other (See Comments)    Hydrocodone causes vomiting    Oxycodone Nausea And Vomiting and Other (See Comments)    Oral oxycodone causes vomiting   Sertraline Hives, Swelling, Rash and Other (See Comments)    [DERM][OTHER]RASH WITH SWELLING   Macrobid [Nitrofurantoin]     Other reaction(s): 3.9.17 nausea and vomitting,  NOT DOCUMENTED ON THE MAR   Phenol Hives    NOT DOCUMENTED ON THE MAR   Rofecoxib     Other reaction(s): Unknown  NOT DOCUMENTED ON THE MAR    HOME MEDICATIONS:  Current Outpatient Medications:    acetaminophen (TYLENOL) 500 MG tablet, Take 1,000 mg by mouth every 6 (six) hours as needed (fever/headache/pain)., Disp: , Rfl:    ALPRAZolam (XANAX) 0.5 MG tablet, Take 1 tablet (0.5 mg total) by mouth at bedtime. May also take 1 tablet every 12 hours as needed anxiety, Disp: 10 tablet, Rfl: 0   ARTIFICIAL TEARS 0.2-0.2-1 % SOLN, Place 2 drops into both eyes daily as needed (for dry eyes)., Disp: , Rfl:    baclofen (LIORESAL) 20  MG tablet, Take 20 mg by mouth daily as needed (for mutiple sclerosis)., Disp: , Rfl:    Benzethonium Chloride 0.13 % LIQD, Apply 1 Application topically in the morning, at noon, and at bedtime. Use on groin area before to application of nystatin powder, Disp: , Rfl:    Biotin 10000 MCG TABS, Take 10,000 mcg by mouth daily after lunch., Disp: , Rfl:    bisacodyl (DULCOLAX) 10 MG suppository, Place 10 mg rectally daily as needed for moderate constipation., Disp: , Rfl:    buPROPion (WELLBUTRIN XL) 150 MG 24 hr tablet, Take 1 tablet (150 mg total) by mouth daily. (Patient taking differently: Take 150 mg by mouth daily. After breakfast to after lunch), Disp: 90 tablet, Rfl: 1   Calcium Carbonate-Vitamin D3 600-400 MG-UNIT TABS, Take 1 tablet by mouth in the morning and at bedtime. (After breakfast & after lunch), Disp: , Rfl:    cetirizine (ZYRTEC) 10 MG tablet, Take 10 mg by mouth daily as needed for allergies. , Disp: , Rfl:    Cholecalciferol (VITAMIN D3) 50 MCG (2000 UT) TABS, Take 2,000 Units by mouth in the morning and at bedtime. (After breakfast & after lunch), Disp: , Rfl:    clotrimazole (LOTRIMIN) 1 % cream, Apply 1 Application topically 2 (two) times daily. Mix with the ordered 1% hydrocortisone cream 1:1. Apply to perineal region and abdominal folds., Disp: , Rfl:    Coenzyme Q10 (COQ10 PO), Take 100 mg by mouth daily in the afternoon., Disp: , Rfl:    corticotropin (ACTHAR) 80 UNIT/ML injectable gel, Inject 1 mL (80 Units total) into the skin every other day for 10 days. May give additional doses PRN. (Patient taking differently: Inject 80 Units into the skin as needed (for a boost fives times a month). Husband will bring in medication administer it in front of the nurse), Disp: 8 mL, Rfl: 3   D-Mannose 500 MG CAPS, Take 500 mg by mouth every morning., Disp: , Rfl:    Dermatological Products, Misc. [facility] EX), Apply 1 Application topically every morning. Apply to affected areas of groin and  inguinal creases, Disp: , Rfl:    diclofenac Sodium (VOLTAREN) 1 % GEL, Apply 2 g topically 3 (three) times daily as needed. (Patient taking differently: Apply 2 g topically 3 (three) times daily as needed (pain (applied to bilateral shoulders)).), Disp: 100 g, Rfl: 3   diphenhydrAMINE (BENADRYL) 25 mg capsule, Take 25 mg by mouth 3 (three) times daily as needed., Disp: , Rfl:    ELIQUIS 5 MG TABS tablet, Take 5 mg by mouth 2 (two) times daily., Disp: , Rfl:    Emollient (CERAVE MOISTURIZING) CREA, Apply 1 Application topically in the morning., Disp: , Rfl:    escitalopram (LEXAPRO) 10 MG tablet, Take 1 tablet (10 mg total) by mouth daily. (Patient taking differently: Take 10 mg by mouth at bedtime.), Disp: 90 tablet, Rfl: 1  famotidine (PEPCID) 20 MG tablet, Take 20 mg by mouth at bedtime., Disp: , Rfl:    fluticasone 0.05%-ketoconazole 2% 1:1 cream mixture, Apply 1 Application topically 2 (two) times daily as needed., Disp: , Rfl:    gabapentin (NEURONTIN) 300 MG capsule, Toni [name] will take 300 mg of gabapentin at 6 AM, 10 AM, 2 PM and 6 PM and 600 mg at 10 PM by mouth. (Patient taking differently: Take 300 mg by mouth See admin instructions. Give 1 capsule by mouth at 0600, 1000, 1800, and 2200), Disp: 180 capsule, Rfl: 11   hydrocortisone cream 1 %, Apply 1 Application topically 2 (two) times daily. Mix 1:1 with clotrimazole cream, Disp: , Rfl:    lactulose (CHRONULAC) 10 GM/15ML solution, Take 15 mLs (10 g total) by mouth 2 (two) times daily as needed for mild constipation., Disp: 236 mL, Rfl: 0   levothyroxine (SYNTHROID, LEVOTHROID) 75 MCG tablet, Take 75 mcg by mouth daily before breakfast. (0500), Disp: , Rfl:    melatonin 5 MG TABS, Take 5 mg by mouth at bedtime., Disp: , Rfl:    metolazone (ZAROXOLYN) 2.5 MG tablet, Take 2.5 mg by mouth daily as needed (for weight gain greater than 3 lbs in 2 days with swelling)., Disp: , Rfl:    metoprolol succinate (TOPROL-XL) 25 MG 24 hr tablet, Take  0.5 tablets (12.5 mg total) by mouth daily. Take with or immediately following a meal., Disp: 90 tablet, Rfl: 3   midodrine (PROAMATINE) 2.5 MG tablet, Take 3 tablets (7.5 mg total) by mouth 3 (three) times daily with meals., Disp: , Rfl:    Multiple Vitamin (MULTIVITAMIN WITH MINERALS) TABS tablet, Take 1 tablet by mouth daily after lunch., Disp: , Rfl:    NON FORMULARY, ASV breathing machine is to be used while napping & at bedtime. Fill canister up to max line with distilled water and turn concentrator to 5LPM., Disp: , Rfl:    omeprazole (PRILOSEC) 40 MG capsule, Take 1 capsule (40 mg total) by mouth every morning., Disp: 30 capsule, Rfl: 11   ondansetron (ZOFRAN-ODT) 4 MG disintegrating tablet, Take 4 mg by mouth in the morning, at noon, in the evening, and at bedtime., Disp: , Rfl:    oxybutynin (DITROPAN) 5 MG tablet, Take 5 mg by mouth 3 (three) times daily as needed for bladder spasms., Disp: , Rfl:    phenazopyridine (PYRIDIUM) 100 MG tablet, Take 100 mg by mouth 3 (three) times daily as needed for pain., Disp: , Rfl:    phenylephrine-shark liver oil-mineral oil-petrolatum (PREPARATION H) 0.25-14-74.9 % rectal ointment, Place 1 Application rectally every 6 (six) hours as needed for hemorrhoids., Disp: , Rfl:    potassium chloride SA (KLOR-CON) 20 MEQ tablet, Take 2 tablets (40 mEq total) by mouth daily. (Patient taking differently: Take 20 mEq by mouth 2 (two) times daily. (After lunch & after dinner)), Disp: 180 tablet, Rfl: 3   promethazine (PHENERGAN) 12.5 MG tablet, Take 12.5 mg by mouth every 8 (eight) hours as needed for nausea or vomiting., Disp: , Rfl:    Prucalopride Succinate (MOTEGRITY) 2 MG TABS, Take 1 tablet by mouth every morning., Disp: , Rfl:    psyllium (METAMUCIL SMOOTH TEXTURE) 28 % packet, Take 1 packet by mouth 2 (two) times daily., Disp: , Rfl:    senna-docusate (SENOKOT-S) 8.6-50 MG tablet, Take 2 tablets by mouth at bedtime., Disp: , Rfl:    simethicone (MYLICON) 80  MG chewable tablet, Chew 80 mg by mouth 3 (three) times  daily as needed for flatulence., Disp: , Rfl:    simvastatin (ZOCOR) 10 MG tablet, Take 10 mg by mouth at bedtime., Disp: , Rfl:    sodium phosphate (FLEET) 7-19 GM/118ML ENEM, Place 1 enema rectally daily as needed for severe constipation., Disp: , Rfl:    SYRINGE/NEEDLE, DISP, 1 ML (BD LUER-LOK SYRINGE) 25G X 5/8" 1 ML MISC, Use as directed to inject  Acthar, Disp: 10 each, Rfl: prn   torsemide (DEMADEX) 20 MG tablet, Take 2 tablets (40 mg total) by mouth daily., Disp: , Rfl:    zinc oxide 20 % ointment, Apply 1 application topically as needed for irritation (apply labia every shift as needed for redness)., Disp: , Rfl:   PAST MEDICAL HISTORY: Past Medical History:  Diagnosis Date   Abnormality of gait 03/01/2015   Anxiety    Arthritis    "knees" (01/19/2016)   Asthma    as a child    Atrial fibrillation ([place])    Back pain    Bilateral carpal tunnel syndrome 10/04/2020   Cancer ([place])    / RENAL CELL CARCINOMA - GETS CT SCAN EVERY 6 MONTHS TO WATCH    CHF (congestive heart failure) (HCC)    Chronic pain    "nerve pain from the MS"   Complex atypical endometrial hyperplasia    Constipation    Depression    DVT (deep venous thrombosis) ([place]) 1983   "RLE; may have just been phlebitis; it was before the age of dopplers"   Dysrhythmia    afib    Edema    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' ablation of R GSV 2012   GERD (gastroesophageal reflux disease)    HLD (hyperlipidemia)    Hypertension    Hypotension    Hypothyroidism    Joint pain    Multiple sclerosis ([place])    has had this may 1989-[place] reg doc   OSA on CPAP    bipap machine - SLEEP APNEA WORSE SINCE COVID IN 1/21 PER PATIENT SETTING HAS INCREASED FROM 9 TO 30    Osteoarthritis    PAF (paroxysmal atrial fibrillation) ([place])    on coumadin; documented on monitor 06/2010 - no longer takes per [facility] 02/09/21   Pneumonia 11/2011   Sleep  apnea    uses sleep apnea machine    PAST SURGICAL HISTORY: Past Surgical History:  Procedure Laterality Date   ANTERIOR CERVICAL DECOMP/DISCECTOMY FUSION  01/2004   CARDIAC CATHETERIZATION  06/22/2010   normal coronary arteries, PAF   CATARACT EXTRACTION, BILATERAL Bilateral    CHILD BIRTHS     X2   DILATATION & CURETTAGE/HYSTEROSCOPY WITH MYOSURE N/A 01/15/2020   Procedure: DILATATION & CURETTAGE/HYSTEROSCOPY WITH MYOSURE;  Surgeon: Arvella Nigh, MD;  Location: [facility];  Service: Gynecology;  Laterality: N/A;  pt in wheelchair-has MS   DILATION AND CURETTAGE OF UTERUS     DILATION AND CURETTAGE OF UTERUS N/A 02/16/2020   Procedure: DILATATION AND CURETTAGE;  Surgeon: Lafonda Mosses, MD;  Location: [facility];  Service: Gynecology;  Laterality: N/A;  NOT GENERAL -NO INTUBATION   DNC     INFUSION PUMP IMPLANTATION  ~ 2009   baclofen infusion in lower abd   INTRATHECAL PUMP REVISION N/A 02/18/2021   Procedure: Baclofen Pump replacement with possible revision of catheter;  Surgeon: Erline Levine, MD;  Location: [place];  Service: Neurosurgery;  Laterality: N/A;  3C/RM 20   INTRAUTERINE DEVICE (IUD) INSERTION N/A 02/16/2020  Procedure: INTRAUTERINE DEVICE (IUD) INSERTION - MIRENA;  Surgeon: Lafonda Mosses, MD;  Location: [facility];  Service: Gynecology;  Laterality: N/A;   IUD put in uterus     PAIN PUMP REVISION N/A 07/29/2014   Procedure: Baclofen pump replacement;  Surgeon: Erline Levine, MD;  Location: [place] [facility];  Service: Neurosurgery;  Laterality: N/A;  Baclofen pump replacement   TONSILLECTOMY AND ADENOIDECTOMY     VARICOSE VEIN SURGERY  ~ 1968   VENOUS ABLATION  12/16/2010   radiofreq ablation -Dr Elisabeth Cara and [facility]   WISDOM TOOTH EXTRACTION      FAMILY HISTORY: Family History  Problem Relation Age of Onset   Depression Mother    Sudden death Mother    Anxiety disorder Mother    Coronary artery disease Father        at age 15   Hyperlipidemia Father    Thyroid  disease Father    Coronary artery disease Maternal Grandmother    Depression Maternal Grandmother    Cancer Paternal Grandmother        breast   Alcoholism Son    Sleep apnea Neg Hx    Multiple sclerosis Neg Hx     SOCIAL HISTORY:  Social History   Socioeconomic History   Marital status: Married    Spouse name: [name]   Number of children: 2   Years of education: COLLEGE   Highest education level: Not on file  Occupational History   Occupation: RETIRED  Tobacco Use   Smoking status: Former    Packs/day: 0.50    Years: 7.00    Total pack years: 3.50    Types: Cigarettes    Quit date: 08/21/1976    Years since quitting: 45.9   Smokeless tobacco: Never  Vaping Use   Vaping Use: Never used  Substance and Sexual Activity   Alcohol use: Not Currently   Drug use: No   Sexual activity: Not Currently    Birth control/protection: Post-menopausal  Other Topics Concern   Not on file  Social History Narrative   Lives in [place] with Husband [name] [phone], [phone]   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   Right handed   Drinks a little caffeine in coffee sometimes    Social Determinants of Health   Financial Resource Strain: Not on file  Food Insecurity: No Food Insecurity (07/14/2022)   Hunger Vital Sign    Worried About Running Out of Food in the Last Year: Never true    Ran Out of Food in the Last Year: Never true  Transportation Needs: No Transportation Needs (07/15/2022)   PRAPARE - Hydrologist (Medical): No    Lack of Transportation (Non-Medical): No  Physical Activity: Not on file  Stress: Not on file  Social Connections: Not on file  Intimate Partner Violence: Not At Risk (07/15/2022)   Humiliation, Afraid, Rape, and Kick questionnaire    Fear of Current or Ex-Partner: No    Emotionally Abused: No    Physically Abused: No    Sexually Abused: No     PHYSICAL EXAM  There were no vitals filed for this visit.   There is no height or weight on  file to calculate BMI.   General: The patient is well-developed and well-nourished and in no acute distress  HEENT:  Head is [place]/AT.    Neurologic Exam  Mental status: The patient is alert and oriented x 3 at the time of the examination. The patient has apparent normal  recent and remote memory, with an apparently normal attention span and concentration ability.   .  Cranial nerves: Extraocular movements are full.  Facial strength was normal  Motor:  Muscle bulk reduced at the thenar eminences.  Muscle tone was just mildly increased in the legs.  She has reduced grip bilaterally and 3/5 strength in the intrinsic hand muscles.  Strength is 2+/5 in the legs  Sensory: Sensory testing is intact to touch x4.\  Gait and station: She is unable to stand or walk.   Reflexes: Deep tendon reflexes are symmetric in the arms and absent in the legs.  No ankle clonus.   DIAGNOSTIC DATA (LABS, IMAGING, TESTING) - I reviewed patient records, labs, notes, testing and imaging myself where available.  Lab Results  Component Value Date   WBC 7.1 07/16/2022   HGB 11.8 (L) 07/16/2022   HCT 34.2 (L) 07/16/2022   MCV 91.0 07/16/2022   PLT 181 07/16/2022      Component Value Date/Time   NA 137 07/16/2022 0319   NA 144 12/15/2020 1100   K 3.5 07/16/2022 0319   CL 93 (L) 07/16/2022 0319   CO2 31 07/16/2022 0319   GLUCOSE 83 07/16/2022 0319   BUN 15 07/16/2022 0319   BUN 27 12/15/2020 1100   CREATININE 0.59 07/16/2022 0319   CALCIUM 8.9 07/16/2022 0319   PROT 5.5 (L) 07/13/2022 2030   PROT 5.9 (L) 09/20/2020 1436   ALBUMIN 3.0 (L) 07/13/2022 2030   ALBUMIN 3.9 09/20/2020 1436   AST 20 07/13/2022 2030   ALT 7 07/13/2022 2030   ALKPHOS 63 07/13/2022 2030   BILITOT 1.1 07/13/2022 2030   BILITOT 0.3 09/20/2020 1436   GFRNONAA >60 07/16/2022 0319   GFRAA 90 09/20/2020 1436   Lab Results  Component Value Date   CHOL 111 03/19/2019   HDL 57 03/19/2019   LDLCALC 43 03/19/2019   TRIG 53  03/19/2019   CHOLHDL 3.0 04/22/2010   Lab Results  Component Value Date   HGBA1C 4.9 06/05/2020   No results found for: "VITAMINB12" Lab Results  Component Value Date   TSH 2.961 01/03/2021       ASSESSMENT AND PLAN  Multiple sclerosis (HCC)  Spasticity  Presence of intrathecal pump   The baclofen pump was refilled with 38 cc baclofen at 2000 mg per ml (note we were unable to get the last 2 cc of baclofen and the pump was set at 38 instead of 40).  Continue simple continuous rate of 829.6 mcg/day. She will return in about 3 months for next pump refill.   F/u with Dr. Brett Fairy for other [place] / neuro issues  Toni [name] A. Felecia Shelling, MD, [facility] 50/04/3817, 2:99 PM Certified in Neurology, Clinical Neurophysiology, Sleep Medicine and Neuroimaging  [facility] [address] [place], [place] [address] [phone]

## 2022-07-31 NOTE — Progress Notes (Unsigned)
[facility] Palliative Care Consult Note Telephone: [phone]  Fax: [phone]    Date of encounter: 07/31/22 12:10 PM PATIENT NAME: Toni [name] [name] [place] [address]   [phone] (home)  DOB: 09-18-1945 MRN: [id number] PRIMARY CARE PROVIDER:    Jodi Marble, MD,  [place] [address] [phone]  REFERRING PROVIDER:   Jodi Marble, MD [address] [place],  [place] [address] [phone]  RESPONSIBLE PARTY:    Contact Information     Name Relation Home Work Mobile   Witz,Kristin Daughter [phone]  [phone]   Haegele,Ronald Spouse [phone]  [phone]   [place] Other [phone]  [phone]        I met face to face with patient and her caregiver Magda Paganini in [facility] facility. Palliative Care was asked to follow this patient by consultation request of  Jodi Marble, MD to address advance care planning and complex medical decision making. This is a follow up visit.                                   ASSESSMENT, SYMPTOM MANAGEMENT AND PLAN / RECOMMENDATIONS:   Acute cystitis without hematuria Resolved Urine culture positive 07/13/22 for 50,000 ESBL E coli, blood culture with no growth Treated inpatient initially with Rocephin then started on Meropenem for 3 days and given Fosfomycin po at d/c Afebrile and without leukoucytosis Continues on d mannose 500 mg daily and pyridium for urinary analgesia.  2.    Chronic diastolic CHF Entresto was stopped with hypotension during hospitalization Continues with significant positive weight gain of close to 14 lbs Continues metoprolol XL 12.5 mg daily. Echo done at Dr [facility] office showed reduced EF 45-50% with LV mildly decreased function and global hypokinesis.  Mild LVH. Trivial MR, TR and PR. This represents a change from 06/10/20 when EF 55-60 with no wall motion abnormalities.  3.  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 Continue Midodrine 7.5 mg TID prn Increase po intake. Continue compression stockings daily during the day, remove at night     CODE STATUS: Full code    Follow up Palliative Care Visit: Palliative care will continue to follow for complex medical decision making, advance care planning, and clarification of goals. Return 4 weeks or prn.   This visit was coded based on medical decision making (MDM).  PPS: 40%  HOSPICE ELIGIBILITY/DIAGNOSIS: TBD  Chief Complaint:  Palliative Care is continuing to follow patient for chronic medical management in setting of multiple sclerosis. Pt reports recent hospitalization for urosepsis.  HISTORY OF PRESENT ILLNESS:  Toni [name] is a 76 y.o. year old female with primary chronic progressive MS and nausea.  Nausea has been present since she got Covid in Dec/Jan 2020.  She also has persistent atrial fibrillation, hypothyroidism, Cushings-ACTH dependent, bilateral carpal tunnel, neurogenic bladder, OS, Obesity hypoventilation syndrome treated on BiPap, bilateral LE weakness and spasticity on Baclofen intrathecal pump, complex atypical endometrial hyperplasia, chronic diastolic heart failure with preserved EF and nonischemic cardiomyopathy.  Pt has appt today to follow up with Dr Felecia Shelling for refill of intrathecal baclofen pump.  She requires assistance with all ADLs except feeding.  Her paid caregiver Magda Paganini is off on Mondays.  Pt states she had a recent hospitalization for UTI and hyponatremia.  Has persistently intermittent nausea since having Covid but  has medicine that helps.  She is incontinent of bowel and bladder.  Today her husband is present and accompanying her to her doctor's appointment.  She denies CP, SOB, falls.  Hospital admission on 07/13/22 pt was constipated and given enemas and suppositories to help and placed on a liquid diet,  thought to have an ileus of SBO.  CT abd/pelvis without SBO/ileus and with mild bilateral hydronephrosis.  Urine culture grew out E coli, blood cultures were negative. Initially she was treated with Rocephin then switched to Meropenem on 07/15/22 and finally given 1 dose oral fosfomycin. Pt denies any hx of seizures with hyponatremia. Culture showed 50,000 E coli resistant to Ampicillin, most Cephalosporins and Cipro, Bactrim.  History obtained from review of EMR, discussion with family  (spouse [name]) and interview with Ms. Donofrio.      Latest Ref Rng & Units 07/16/2022    3:19 AM 07/14/2022    4:50 AM 07/13/2022    8:30 PM  CBC  WBC 4.0 - 10.5 K/uL 7.1  7.5  6.7   Hemoglobin 12.0 - 15.0 g/dL 11.8  12.2  13.0   Hematocrit 36.0 - 46.0 % 34.2  35.9  37.9   Platelets 150 - 400 K/uL 181  158  157        Latest Ref Rng & Units 07/16/2022    3:19 AM 07/15/2022    7:18 AM 07/14/2022    4:50 AM  CMP  Glucose 70 - 99 mg/dL 83  83  75   BUN 8 - 23 mg/dL _0 Creatinine 0.44 - 1.00 mg/dL 0.59  0.63  0.48   Sodium 135 - 145 mmol/L 137  138  139   Potassium 3.5 - 5.1 mmol/L 3.5  3.8  4.0   Chloride 98 - 111 mmol/L 93  96  99   CO2 22 - 32 mmol/L _1 Calcium 8.9 - 10.3 mg/dL 8.9  8.9  8.7   Sodium nadir of 125 on 07/13/22 I reviewed available labs, medications, imaging, studies and related documents from the EMR.  Records reviewed and summarized above.   ROS  General: NAD Cardiovascular: denies chest pain, orthopnea or PND Pulmonary: denies cough, denies increased SOB Abdomen: endorses good appetite,  endorses bowel incontinence intermittently GU:  denies dysuria, incontinence of urine at times MSK:  endorses increased weakness,  no falls reported Skin: denies rashes or wounds Neurological: denies insomnia, Psych: endorses coping well Heme/lymph/immuno: denies bruises, abnormal bleeding  Physical Exam: Current and past weights: Last weight 213 lbs 10 oz as of 07/17/22,   198.8 as of 06/14/22 at facility, 193 lbs as of 11/16/21 Constitutional: NAD, chronically ill appearing General: obese  ENMT: intact hearing, oral mucous membranes moist, dentition intact CV: S1S2, IRIR, 1+ pedal edema, wearing compression hose bilaterally Pulmonary: CTAB, no increased work of breathing, no cough, room air Abdomen: normoactive BS + 4 quadrants, soft and non tender, no ascites MSK: no sarcopenia, moves all extremities Skin: warm and dry, no rashes or wounds on visible skin.  Neuro:  noted generalized weakness,  no cognitive impairment Psych: non-anxious, blunted affect, A and O x 3 Hem/lymph/immuno: no widespread bruising   Thank you for the opportunity to participate in the care of Ms. Louderback.  The palliative care team will continue to follow. Please call our office at [phone] if we can be of additional assistance.   Marijo Conception, FNP -C  COVID-19 PATIENT SCREENING  TOOL Asked and negative response unless otherwise noted:   Have you had symptoms of covid, tested positive or been in contact with someone with symptoms/positive test in the past 5-10 days?  No

## 2022-08-08 DIAGNOSIS — E785 Hyperlipidemia, unspecified: Secondary | ICD-10-CM | POA: Diagnosis not present

## 2022-08-08 DIAGNOSIS — R5381 Other malaise: Secondary | ICD-10-CM | POA: Diagnosis not present

## 2022-08-08 DIAGNOSIS — K219 Gastro-esophageal reflux disease without esophagitis: Secondary | ICD-10-CM | POA: Diagnosis not present

## 2022-08-08 DIAGNOSIS — E039 Hypothyroidism, unspecified: Secondary | ICD-10-CM | POA: Diagnosis not present

## 2022-08-08 DIAGNOSIS — R110 Nausea: Secondary | ICD-10-CM | POA: Diagnosis not present

## 2022-08-08 DIAGNOSIS — N390 Urinary tract infection, site not specified: Secondary | ICD-10-CM | POA: Diagnosis not present

## 2022-08-08 DIAGNOSIS — I4891 Unspecified atrial fibrillation: Secondary | ICD-10-CM | POA: Diagnosis not present

## 2022-08-08 DIAGNOSIS — E871 Hypo-osmolality and hyponatremia: Secondary | ICD-10-CM | POA: Diagnosis not present

## 2022-08-08 DIAGNOSIS — G35 Multiple sclerosis: Secondary | ICD-10-CM | POA: Diagnosis not present

## 2022-08-08 DIAGNOSIS — G4700 Insomnia, unspecified: Secondary | ICD-10-CM | POA: Diagnosis not present

## 2022-08-08 DIAGNOSIS — I509 Heart failure, unspecified: Secondary | ICD-10-CM | POA: Diagnosis not present

## 2022-08-08 DIAGNOSIS — B372 Candidiasis of skin and nail: Secondary | ICD-10-CM | POA: Diagnosis not present

## 2022-08-11 ENCOUNTER — Encounter: Payer: Self-pay | Admitting: Gynecologic Oncology

## 2022-08-11 ENCOUNTER — Inpatient Hospital Stay: Payer: Medicare Other | Attending: Gynecologic Oncology | Admitting: Gynecologic Oncology

## 2022-08-11 ENCOUNTER — Other Ambulatory Visit: Payer: Self-pay

## 2022-08-11 VITALS — BP 104/68 | HR 119 | Temp 98.5°F | Resp 16 | Wt 196.2 lb

## 2022-08-11 DIAGNOSIS — N710 Acute inflammatory disease of uterus: Secondary | ICD-10-CM | POA: Diagnosis not present

## 2022-08-11 DIAGNOSIS — Z79890 Hormone replacement therapy (postmenopausal): Secondary | ICD-10-CM | POA: Insufficient documentation

## 2022-08-11 DIAGNOSIS — N8502 Endometrial intraepithelial neoplasia [EIN]: Secondary | ICD-10-CM

## 2022-08-11 NOTE — Patient Instructions (Signed)
I'll call you with biopsy results. If biopsy shows no cancer or precancer again, we will plan to have you follow-up with a gynecologist every year.

## 2022-08-11 NOTE — Progress Notes (Signed)
Gynecologic Oncology Return Clinic Visit  08/11/22  Reason for Visit: surveillance in the setting of hormonal treatment of CAH    Treatment History: Patient presented with vaginal discharge 01/08/20 - Pelvic ultrasound showed stable 3.5cm adnexal mass and thickened endometrial lining with large polyp noted on SIS 01/15/20 - hysteroscopy with polypectomy and D&C. Pathology: Complex atypical hyperplasia.  Endometrial curettings: Scant endocervical type glandular tissue, insufficient endometrial tissue. 02/16/20 - D&C, Mirena IUD insertion. Pathology: benign inactive endometrium with focal secretory changes, no hyperplasia or carcinoma. 06/24/20 -endometrial biopsy showed fragmented endometrium with metaplastic changes.  No atypia or features of malignancy.  Glands are fragmented which somewhat hampers evaluation for hyperplasia. 10/19/20 -endometrial biopsy shows scant inactive endometrium, no hyperplasia or malignancy. 08/19/21 -endometrial biopsy shows benign endometrial polyp, nonpolypoid benign inactive endometrium, no hyperplasia or malignancy.  Interval History: Patient reports overall doing well.  Was recently hospitalized in the setting of urinary tract infection and hyponatremia.  Notes has been doing well since discharge.  Had 1 episode of spotting since I saw her, denies any other further bleeding.  Denies any pelvic pain or cramping.  Past Medical/Surgical History: Past Medical History:  Diagnosis Date   Abnormality of gait 03/01/2015   Anxiety    Arthritis    "knees" (01/19/2016)   Asthma    as a child    Atrial fibrillation ([place])    Back pain    Bilateral carpal tunnel syndrome 10/04/2020   Cancer ([place])    / RENAL CELL CARCINOMA - GETS CT SCAN EVERY 6 MONTHS TO WATCH    CHF (congestive heart failure) ([facility])    Chronic pain    "nerve pain from the MS"   Complex atypical endometrial hyperplasia    Constipation    Depression    DVT (deep venous thrombosis) ([place]) 1983   "RLE;  may have just been phlebitis; it was before the age of dopplers"   Dysrhythmia    afib    Edema    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' ablation of R GSV 2012   GERD (gastroesophageal reflux disease)    HLD (hyperlipidemia)    Hypertension    Hypotension    Hypothyroidism    Joint pain    Multiple sclerosis ([place])    has had this may 1989-[facility] reg doc   OSA on CPAP    bipap machine - SLEEP APNEA WORSE SINCE COVID IN 1/21 PER PATIENT SETTING HAS INCREASED FROM 9 TO 30    Osteoarthritis    PAF (paroxysmal atrial fibrillation) ([place])    on coumadin; documented on monitor 06/2010 - no longer takes per [facility] 02/09/21   Pneumonia 11/2011   Sleep apnea    uses sleep apnea machine    Past Surgical History:  Procedure Laterality Date   ANTERIOR CERVICAL DECOMP/DISCECTOMY FUSION  01/2004   CARDIAC CATHETERIZATION  06/22/2010   normal coronary arteries, PAF   CATARACT EXTRACTION, BILATERAL Bilateral    CHILD BIRTHS     X2   DILATATION & CURETTAGE/HYSTEROSCOPY WITH MYOSURE N/A 01/15/2020   Procedure: DILATATION & CURETTAGE/HYSTEROSCOPY WITH MYOSURE;  Surgeon: Arvella Nigh, MD;  Location: [facility];  Service: Gynecology;  Laterality: N/A;  pt in wheelchair-has MS   DILATION AND CURETTAGE OF UTERUS     DILATION AND CURETTAGE OF UTERUS N/A 02/16/2020   Procedure: DILATATION AND CURETTAGE;  Surgeon: Lafonda Mosses, MD;  Location: [facility];  Service: Gynecology;  Laterality: N/A;  NOT GENERAL -NO  INTUBATION   DNC     INFUSION PUMP IMPLANTATION  ~ 2009   baclofen infusion in lower abd   INTRATHECAL PUMP REVISION N/A 02/18/2021   Procedure: Baclofen Pump replacement with possible revision of catheter;  Surgeon: Erline Levine, MD;  Location: [facility];  Service: Neurosurgery;  Laterality: N/A;  3C/RM 20   INTRAUTERINE DEVICE (IUD) INSERTION N/A 02/16/2020   Procedure: INTRAUTERINE DEVICE (IUD) INSERTION - MIRENA;  Surgeon: Lafonda Mosses, MD;  Location: [facility];   Service: Gynecology;  Laterality: N/A;   IUD put in uterus     PAIN PUMP REVISION N/A 07/29/2014   Procedure: Baclofen pump replacement;  Surgeon: Erline Levine, MD;  Location: [facility];  Service: Neurosurgery;  Laterality: N/A;  Baclofen pump replacement   TONSILLECTOMY AND ADENOIDECTOMY     VARICOSE VEIN SURGERY  ~ 1968   VENOUS ABLATION  12/16/2010   radiofreq ablation -Dr Elisabeth Cara and [facility]   WISDOM TOOTH EXTRACTION      Family History  Problem Relation Age of Onset   Depression Mother    Sudden death Mother    Anxiety disorder Mother    Coronary artery disease Father        at age 47   Hyperlipidemia Father    Thyroid disease Father    Coronary artery disease Maternal Grandmother    Depression Maternal Grandmother    Cancer Paternal Grandmother        breast   Alcoholism Son    Sleep apnea Neg Hx    Multiple sclerosis Neg Hx     Social History   Socioeconomic History   Marital status: Married    Spouse name: [name]   Number of children: 2   Years of education: COLLEGE   Highest education level: Not on file  Occupational History   Occupation: RETIRED  Tobacco Use   Smoking status: Former    Packs/day: 0.50    Years: 7.00    Total pack years: 3.50    Types: Cigarettes    Quit date: 08/21/1976    Years since quitting: 46.0   Smokeless tobacco: Never  Vaping Use   Vaping Use: Never used  Substance and Sexual Activity   Alcohol use: Not Currently   Drug use: No   Sexual activity: Not Currently    Birth control/protection: Post-menopausal  Other Topics Concern   Not on file  Social History Narrative   Lives in [place] with Husband [name] [phone], [phone]   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   Right handed   Drinks a little caffeine in coffee sometimes    Social Determinants of Health   Financial Resource Strain: Not on file  Food Insecurity: No Food Insecurity (07/14/2022)   Hunger Vital Sign    Worried About Running Out of Food in the Last Year: Never true     Ran Out of Food in the Last Year: Never true  Transportation Needs: No Transportation Needs (07/15/2022)   PRAPARE - Hydrologist (Medical): No    Lack of Transportation (Non-Medical): No  Physical Activity: Not on file  Stress: Not on file  Social Connections: Not on file    Current Medications:  Current Outpatient Medications:    acetaminophen (TYLENOL) 500 MG tablet, Take 1,000 mg by mouth every 6 (six) hours as needed (fever/headache/pain)., Disp: , Rfl:    ALPRAZolam (XANAX) 0.5 MG tablet, Take 1 tablet (0.5 mg total) by mouth at bedtime. May also take 1 tablet every  12 hours as needed anxiety, Disp: 10 tablet, Rfl: 0   ARTIFICIAL TEARS 0.2-0.2-1 % SOLN, Place 2 drops into both eyes daily as needed (for dry eyes)., Disp: , Rfl:    Benzethonium Chloride 0.13 % LIQD, Apply 1 Application topically in the morning, at noon, and at bedtime. Use on groin area before to application of nystatin powder, Disp: , Rfl:    Biotin 10000 MCG TABS, Take 10,000 mcg by mouth daily after lunch., Disp: , Rfl:    bisacodyl (DULCOLAX) 10 MG suppository, Place 10 mg rectally daily as needed for moderate constipation., Disp: , Rfl:    buPROPion (WELLBUTRIN XL) 150 MG 24 hr tablet, Take 1 tablet (150 mg total) by mouth daily. (Patient taking differently: Take 150 mg by mouth daily. After breakfast to after lunch), Disp: 90 tablet, Rfl: 1   Calcium Carbonate-Vitamin D3 600-400 MG-UNIT TABS, Take 1 tablet by mouth in the morning and at bedtime. (After breakfast & after lunch), Disp: , Rfl:    cetirizine (ZYRTEC) 10 MG tablet, Take 10 mg by mouth daily as needed for allergies. , Disp: , Rfl:    Cholecalciferol (VITAMIN D3) 50 MCG (2000 UT) TABS, Take 2,000 Units by mouth in the morning and at bedtime. (After breakfast & after lunch), Disp: , Rfl:    clotrimazole (LOTRIMIN) 1 % cream, Apply 1 Application topically 2 (two) times daily. Mix with the ordered 1% hydrocortisone cream 1:1. Apply  to perineal region and abdominal folds., Disp: , Rfl:    Coenzyme Q10 (COQ10 PO), Take 100 mg by mouth daily in the afternoon., Disp: , Rfl:    corticotropin (ACTHAR) 80 UNIT/ML injectable gel, Inject 1 mL (80 Units total) into the skin every other day for 10 days. May give additional doses PRN. (Patient taking differently: Inject 80 Units into the skin as needed (for a boost fives times a month). Husband will bring in medication administer it in front of the nurse), Disp: 8 mL, Rfl: 3   D-Mannose 500 MG CAPS, Take 500 mg by mouth every morning., Disp: , Rfl:    Dermatological Products, Misc. [facility] EX), Apply 1 Application topically every morning. Apply to affected areas of groin and inguinal creases, Disp: , Rfl:    diclofenac Sodium (VOLTAREN) 1 % GEL, Apply 2 g topically 3 (three) times daily as needed. (Patient taking differently: Apply 2 g topically 3 (three) times daily as needed (pain (applied to bilateral shoulders)).), Disp: 100 g, Rfl: 3   diphenhydrAMINE (BENADRYL) 25 mg capsule, Take 25 mg by mouth as needed for itching (number of times per day unspecified on MAR)., Disp: , Rfl:    ELIQUIS 5 MG TABS tablet, Take 5 mg by mouth 2 (two) times daily., Disp: , Rfl:    Emollient (CERAVE MOISTURIZING) CREA, Apply 1 Application topically in the morning., Disp: , Rfl:    escitalopram (LEXAPRO) 10 MG tablet, Take 1 tablet (10 mg total) by mouth daily. (Patient taking differently: Take 10 mg by mouth at bedtime.), Disp: 90 tablet, Rfl: 1   famotidine (PEPCID) 20 MG tablet, Take 20 mg by mouth at bedtime., Disp: , Rfl:    gabapentin (NEURONTIN) 300 MG capsule, Mrs. Napoleon will take 300 mg of gabapentin at 6 AM, 10 AM, 2 PM and 6 PM and 600 mg at 10 PM by mouth. (Patient taking differently: Take 300 mg by mouth See admin instructions. Give 1 capsule by mouth at 0600, 1000, 1800, and 2200), Disp: 180 capsule, Rfl: 11  lactulose (CHRONULAC) 10 GM/15ML solution, Take 15 mLs (10 g total) by mouth 2 (two)  times daily as needed for mild constipation. (Patient taking differently: Take 10 g by mouth 2 (two) times daily.), Disp: 236 mL, Rfl: 0   levothyroxine (SYNTHROID, LEVOTHROID) 75 MCG tablet, Take 75 mcg by mouth daily before breakfast. (0500), Disp: , Rfl:    metolazone (ZAROXOLYN) 2.5 MG tablet, Take 2.5 mg by mouth daily as needed (for weight gain greater than 3 lbs in 2 days with swelling)., Disp: , Rfl:    metoprolol succinate (TOPROL-XL) 25 MG 24 hr tablet, Take 0.5 tablets (12.5 mg total) by mouth daily. Take with or immediately following a meal., Disp: 90 tablet, Rfl: 3   midodrine (PROAMATINE) 2.5 MG tablet, Take 3 tablets (7.5 mg total) by mouth 3 (three) times daily with meals. (Patient taking differently: Take 7.5 mg by mouth 3 (three) times daily as needed (with meals for sbp <100).), Disp: , Rfl:    Multiple Vitamin (MULTIVITAMIN WITH MINERALS) TABS tablet, Take 1 tablet by mouth daily after lunch., Disp: , Rfl:    NON FORMULARY, ASV breathing machine is to be used while napping & at bedtime. Fill canister up to max line with distilled water and turn concentrator to 5LPM., Disp: , Rfl:    omeprazole (PRILOSEC) 40 MG capsule, Take 1 capsule (40 mg total) by mouth every morning., Disp: 30 capsule, Rfl: 11   ondansetron (ZOFRAN-ODT) 4 MG disintegrating tablet, Take 4 mg by mouth in the morning, at noon, in the evening, and at bedtime., Disp: , Rfl:    oxybutynin (DITROPAN) 5 MG tablet, Take 5 mg by mouth daily as needed for bladder spasms (overactive bladder)., Disp: , Rfl:    phenazopyridine (PYRIDIUM) 100 MG tablet, Take 100 mg by mouth 3 (three) times daily as needed for pain., Disp: , Rfl:    phenylephrine-shark liver oil-mineral oil-petrolatum (PREPARATION H) 0.25-14-74.9 % rectal ointment, Place 1 Application rectally every 6 (six) hours as needed for hemorrhoids., Disp: , Rfl:    potassium chloride SA (KLOR-CON) 20 MEQ tablet, Take 2 tablets (40 mEq total) by mouth daily. (Patient  taking differently: Take 20 mEq by mouth 2 (two) times daily. (After lunch & after dinner)), Disp: 180 tablet, Rfl: 3   promethazine (PHENERGAN) 12.5 MG tablet, Take 12.5 mg by mouth every 8 (eight) hours as needed for nausea or vomiting., Disp: , Rfl:    Prucalopride Succinate (MOTEGRITY) 2 MG TABS, Take 1 tablet by mouth every morning., Disp: , Rfl:    psyllium (METAMUCIL SMOOTH TEXTURE) 28 % packet, Take 1 packet by mouth 2 (two) times daily., Disp: , Rfl:    senna-docusate (SENOKOT-S) 8.6-50 MG tablet, Take 2 tablets by mouth at bedtime. (Patient taking differently: Take 2 tablets by mouth 2 (two) times daily.), Disp: , Rfl:    simethicone (MYLICON) 80 MG chewable tablet, Chew 160 mg by mouth 3 (three) times daily as needed for flatulence (indigestion)., Disp: , Rfl:    simvastatin (ZOCOR) 10 MG tablet, Take 10 mg by mouth at bedtime., Disp: , Rfl:    sodium phosphate (FLEET) 7-19 GM/118ML ENEM, Place 1 enema rectally daily as needed for severe constipation., Disp: , Rfl:    SYRINGE/NEEDLE, DISP, 1 ML (BD LUER-LOK SYRINGE) 25G X 5/8" 1 ML MISC, Use as directed to inject  Acthar, Disp: 10 each, Rfl: prn   torsemide (DEMADEX) 20 MG tablet, Take 2 tablets (40 mg total) by mouth daily. (Patient taking differently: Take 20  mg by mouth as directed. Take 1.5 tabs (30 mg) in am and 1 tab (20 MG) in evening), Disp: , Rfl:    zinc oxide 20 % ointment, Apply 1 application topically as needed for irritation (apply labia every shift as needed for redness)., Disp: , Rfl:   Review of Systems: Denies appetite changes, fevers, chills, fatigue, unexplained weight changes. Denies hearing loss, neck lumps or masses, mouth sores, ringing in ears or voice changes. Denies cough or wheezing.  Denies shortness of breath. Denies chest pain or palpitations. Denies leg swelling. Denies abdominal distention, pain, blood in stools, constipation, diarrhea, nausea, vomiting, or early satiety. Denies pain with intercourse,  dysuria, frequency, hematuria or incontinence. Denies hot flashes, pelvic pain, vaginal bleeding or vaginal discharge.   Denies joint pain, back pain or muscle pain/cramps. Denies itching, rash, or wounds. Denies dizziness, headaches, numbness or seizures. Denies swollen lymph nodes or glands, denies easy bruising or bleeding. Denies anxiety, depression, confusion, or decreased concentration.  Physical Exam: BP 104/68 Comment: manual BP  Pulse (!) 119   Temp 98.5 F (36.9 C) (Oral)   Resp 16   Wt 196 lb 3.2 oz (89 kg) Comment: weight from [facility] in [place], per patient  SpO2 100%   BMI 34.76 kg/m  General: Alert, oriented, no acute distress. HEENT: Normocephalic, atraumatic, sclera anicteric. Chest: Unlabored breathing on room air. Abdomen: Obese, soft, nontender.  Normoactive bowel sounds.  No masses or hepatosplenomegaly appreciated.   Extremities: Somewhat limited range of motion of the right lower extremity.  Warm, well perfused.  Trace edema bilaterally. Lymphatics: No cervical, supraclavicular, or inguinal adenopathy. GU: Normal appearing external genitalia without excoriation, or lesions.  Vulva is mildly irritated.  Moderate erythema of the bilateral groins.  IUD strings visible protruding approximately 3-4 cm from the cervical os.  Bimanual exam reveals mobile uterus, no tenderness.    Procedure: Endometrial biopsy Preoperative diagnosis: Complex atypical hyperplasia Postoperative diagnosis: Same as above Provider: Berline Lopes MD Specimens: Endometrial biopsy Procedure: After consent was obtained from the patient and the risks, benefits, and alternatives of the procedure were reviewed, the patient was placed in dorsal lithotomy position.  Speculum was placed in the vagina and the cervix was visualized to get difficulty.  Betadine was used x3 to cleanse the cervix.  Due to visualization, decision made to perform biopsy over my hands.  Speculum was removed.  I palpated the  cervix on vaginal exam.  The endometrial Pipelle was guided into the cervix over my finger until the uterine fundus was felt.  1 pass was performed with scant tissue obtained which was placed in formalin to be sent to pathology. Overall the patient tolerated the procedure well.  Laboratory & Radiologic Studies: None new   Assessment & Plan: Toni [name] is a 76 y.o. woman with a complex past medical history found to have complex atypical hyperplasia, likely combined to an endometrial polyp, presenting for follow-up in the setting of progesterone therapy with a Mirena IUD.   Patient is overall doing well.  He had 1 isolated episode of bleeding since I last saw her.  IUD is still in place.  Endometrial biopsy repeated with minimal tissue noted today.  I will call her with biopsy results.  If biopsy negative, we will plan on yearly visits until IUD is needed to be replaced.  I discussed that these visits could be with an OB/GYN if she prefers.  I stressed the importance of calling if she develops vaginal bleeding or any new  pelvic symptoms.   22 minutes of total time was spent for this patient encounter, including preparation, face-to-face counseling with the patient and coordination of care, and documentation of the encounter.  Jeral Pinch, MD  Division of Gynecologic Oncology  Department of Obstetrics and Gynecology  [facility]

## 2022-08-15 ENCOUNTER — Telehealth: Payer: Self-pay | Admitting: Gynecologic Oncology

## 2022-08-15 LAB — SURGICAL PATHOLOGY

## 2022-08-15 NOTE — Telephone Encounter (Signed)
Called patient with biopsy results

## 2022-09-14 ENCOUNTER — Ambulatory Visit: Payer: Medicare Other | Attending: Internal Medicine | Admitting: Internal Medicine

## 2022-09-14 ENCOUNTER — Encounter: Payer: Self-pay | Admitting: Internal Medicine

## 2022-09-14 VITALS — BP 98/62 | HR 70 | Ht 63.0 in | Wt 202.8 lb

## 2022-09-14 DIAGNOSIS — I5042 Chronic combined systolic (congestive) and diastolic (congestive) heart failure: Secondary | ICD-10-CM | POA: Diagnosis present

## 2022-09-14 DIAGNOSIS — I4821 Permanent atrial fibrillation: Secondary | ICD-10-CM | POA: Diagnosis present

## 2022-09-14 NOTE — Progress Notes (Signed)
OFFICE NOTE  Chief Complaint:  Follow-up heart failure  Primary Care Physician: Jodi Marble, MD  HPI:  Toni [name] is a 77 year old female with multiple sclerosis, paroxysmal atrial fibrillation, on Coumadin, morbid obesity, obstructive sleep apnea, and dyslipidemia. She has had worsening swelling in her legs, thought to be due to an MS flare. She has a history of varicose veins with severe venous insufficiency in both the right and left greater saphenous veins. The right greater saphenous vein was actually successfully ablated by my partner, Dr. Elisabeth Cara, about 1 to 2 years ago. She does have residual reflux in the left greater saphenous vein extending from the proximal calf up to the saphenofemoral junction. The vein measured between 4 and 8 mm with between 1 and 6 seconds of reflux. We had discussed ablation of that before, but she has canceled several times at procedures. At this point, I think there is little benefit to continuing to try for her to undergo ablation. 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 She can wear work showed a compression stockings and this does seem to help her. With regards to her atrial fibrillation she has not had significant recurrence to her knowledge. She continues on warfarin and has been therapeutic, with her INR of 1.9 in the office by fingerstick today.  Toni [name] returns today for followup. She reports her MS is somewhat progressive. She continues to have some enlargement of her legs but no significant swelling. There is a mild varicosities in the right lower extremity. She is unaware of her atrial fibrillation. INR today was 2.3.   I saw Toni [name] back in the office today,  She is describing more shortness of breath and feels that she's been in A. Fib more recently. In fact today6/21her EKG does show persistent atrial fibrillation. In the past she's been paroxysmal , but it may be that she is more  frequently having A. Fib. She has trouble telling whether not she is more short of breath or symptomatic from A. Fib versus her multiple sclerosis. She is also reporting more swelling in her left leg.  Toni [name] returns for follow-up today. She recently saw her neurologist regarding her MS which seems to be flaring. In addition she was noted to have significant apneic events despite using CPAP. There is concern for possible central sleep apnea. Both of these episodes are probably contributing to her recurrent atrial fibrillation. She's been wearing a monitor for a period of time which indicates a burden of A. fib of about 80% of the time. It does not appear that she is in persistent A. fib, rather she has had some episodes of sinus rhythm, but they're much less frequent than her A. Fib.  I saw Toni [name] back today in the office. Recently I saw her as an add-on visit briefly as she was describing some chest pain when she was getting her INR checked. An EKG at that time showed no ischemia. I did not feel that this was cardiac. She continues intermittently have abnormal pains both in her chest as well as her abdomen and extremities. This may be related to MS. She's had difficulty with CPAP and apparently getting a good mask fit. She has been a persistent atrial fibrillation for about 6 months. It's unclear whether or not she symptomatic with this and therefore I have not pursued a rhythm control strategy. Her rate control is generally pretty good and she has been therapeutic  on warfarin. She is reporting worsening swelling of her legs although her weight she says is been stable. She has a history of venous insufficiency and had a right greater saphenous vein ablation by one of my partners. She's concerned about more swelling in her left leg and whether she's developed worsening venous insufficiency. She also reports cold feet with numbness in her extremities. As she is basically wheelchair bound, I cannot assess  whether she is having any claudication.  Toni [name] returns today for follow-up. She underwent venous and arterial lower external Dopplers. Arterial Dopplers were normal which showed no evidence of obstruction. Her venous Doppler showed no DVT. There was significant venous reflux in the left greater saphenous vein. This is an area where she has swelling and discomfort. This may be amenable to treatment. I increased her Lasix and she reports some improvement in her leg swelling. She's trying to elevate her feet and reports she is intolerant of wearing compression stockings.  02/03/2016  Toni [name] was seen today in the office for follow-up of recent hospitalization. She was seeing her neurologist and complained of left chest and left arm pain and was referred to the hospital. She ruled out for MI and was felt that her chest pain was on coronary. She underwent an echocardiogram which showed low-normal EF but was also found to be in A. fib with rapid ventricular response. Her A. fib is persistent at this time but the rate was elevated. She was started on Cardizem which helped improve her rate and was discharged on Cardizem CD 120 mg daily. Heart rate today is better controlled in the 70s. She reports her arm pain is improved and she said today that she "did not feel that it was her heart".  05/09/2016  Toni [name] was seen in the office for follow-up today. She reports improved heart rate control with heart rates between 50 and 80 on diltiazem. Over this will help with her mild cardiomyopathy with EF 50-55% on recent echo. She reports some increased fluid gain particularly in her abdomen. This may be due to some degree of right heart dysfunction. She's working with her neurologist for MS as well as her sleep apnea. There is a component of central sleep apnea and she is reportedly going to have additional sleep study to look for the need for BiPAP therapy. She denies any recurrent chest pain or left arm  pain.  02/14/2017  Toni [name] was seen today in follow-up. She was recently hospitalized. She said problems with increasing fluid gain. I increased her Lasix last time however when she was hospitalized she required high-dose Lasix and says she has she felt better with that. She subsequently been put on BiPAP therapy. She says her sleeping is improved significantly. She is concerned though recently she's had some increased weight gain. She feels like she may need an increasing dose of diuretic. Her last echo was in December 2017 which showed an EF of 55%. This is been stable. If this is some heart failure, may be related diastolic dysfunction however suspect is more likely related to autonomic dysfunction the setting of multiple sclerosis.  03/29/2017  Toni [name] returns today for follow-up. She continues to report lower extremity edema and abdominal swelling. We'll increase her Lasix however she has not noted significant weight loss. It is actually very difficult to estimate her weight because of her MS. We tried to place her on the scale today however she was unable to stand long enough to get an  accurate weight. She reports urination was not as good on 40 mg of Lasix but is currently taking 80 mg once daily and has better urination with that.  05/18/2017  Toni [name] was seen today in follow-up. She reports some improvement in her abdominal swelling and bloating. Check she feels like she might be somewhat dry now. Her weight is down to 246 pounds from 253 pounds. She is also working with the weight loss clinic at [facility]. She is interested in decreasing her diuretic which is reasonable. She asked again about probably trying to get back into sinus rhythm however she's been in persistent A. fib which is long-standing at this point for more than one or 2 years. Her last echo in December 2017 showed mild left atrial enlargement. Is not clear that she's symptomatic with her A. fib although does feel tired at  times. She says she's had some improvement with her sleep apnea therapy.  01/15/2018  Toni [name] returns today for follow-up.  She is continued to lose some weight.  Weight is down now to 218 pounds, significant improvement from her prior weight over 250 pounds.  She also had marked reduction in edema.  Her blood pressure is low normal at the moment and may benefit from reduction in her blood pressure medications.  Her sleep apnea has improved significantly according to Dr. Brett Fairy, especially her obstructive symptoms.  She still has central sleep apnea.  In addition she has secondary progressive MS and recently had worsening symptoms associated with that.  Heart rate was elevated today with regards to A. fib however that did come down after short period of time.  She again appears to be asymptomatic with her A. fib.  The main reason not to pursue sinus rhythm is the fact that she has been persistently in A. fib for well over 5 years, and the likelihood of reestablishing sinus rhythm is exceedingly low.  05/16/2018  Toni [name] is seen today in follow-up.  Her weight is down even further today to 204.  She has had recently some positional dizziness and low blood pressures.  This is likely due to the significant weight loss and I would recommend a decrease in her medications.  She says that she is breathing better.  Her sleep apnea has improved significantly according to her sleep doctor and she has having less symptoms related to her MS.  She is in permanent A. fib with controlled ventricular response.  03/10/2019  Toni [name] returns today for follow-up.  She is done very well overall.  She is continued to lose weight with Dr. Migdalia Dk work.  Weight now 186 pounds down from 210 when I last saw her.  This is amazing since she is essentially sedentary due to her MS.  She reports reasonable control of her A. fib.  She has had some decrease in her PA P requirements with regards to sleep apnea.  Blood pressure is  well controlled.  She has recently had some swelling of the lower extremities and her abdomen.  She wonders if this could be some heart failure.  I suspect the lower extremity swelling is most likely due to venous insufficiency.  I previously performed a venous ablation on the right lower extremity and she still has some venous insufficiency of the left lower extremity however in the common femoral vein not amenable to ablation.  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   01/28/2020  Toni [name] is seen today in follow-up.  She continues to lose some weight related to her diet by Dr. Leafy Ro.  She today is in A. fib with RVR.  Blood pressure has run low recently.  She has not needed to use midodrine that frequently.  She has follow-up with Dr. Brett Fairy in July.  I recently had corresponded with her about findings that Ms. Hert was hypoxic during the day as well as at night.  There are plans to both give her oxygen via BiPAP as well as possible oxygen during the day.  She has been referred to see Dr. Hermina Staggers at [facility] pulmonary.  04/29/2020  Toni [name] seen today for follow-up of A. fib.  Is been 3 months since I last saw her.  In the meantime she has been set up with bleed oxygen into her BiPAP.  I increased her diltiazem which is resulted in some improvement in heart rate although there is still a variability related to her A. fib.  Blood pressure is still reasonable in the low 629 systolic.  She has not required taking additional midodrine.  She feels like she is getting some abdominal bloating/swelling and has started to take some Lasix as needed for this.  She was taken off of this due to orthostasis.  09/20/2020  Toni [name] is seen today in follow-up.  Recently she has been having more difficulty with hypoxemia and her MS.  All of this seems to have started after she had COVID-19.  She recently saw Dr. Brett Fairy and still is having difficulty with hypoxemia.  She is having a repeat sleep  study and I am told I would likely need to prescribe oxygen at night for her since apparently only either pulmonologist or cardiologist can order oxygen.  Recently her home health noted that she was swelling.  Her Lasix was increased to between 20 to 40 mg in the morning and 60 mg at night.  She wonders if this is led to some electrolyte abnormalities as its not been reassessed.  Blood pressure is good today.  Weight has been fairly stable.  09/08/2021  Toni [name] returns for follow-up.  She has been residing at [facility].  More recently we have been notified of some worsening lower extremity edema.  She says she has had some increasing abdominal girth.  The physician assistant there had made some changes in her diuretics.  Actually weight today seems fairly stable.  She is noted to be in A. fib which is permanent but rate controlled.  BNP was performed at their facility and only mildly elevated around 120.  Otherwise labs have been reassuring.  06/08/2022  Toni [name] is seen today in follow-up.  She is residing at [facility].  She has recently noted more abdominal fullness and lower extremity swelling concerning for heart failure although she is on diuretics and has had at least 2 BNP test performed which were negative for any fluid retention.  She remains in the permanent atrial fibrillation which is rate controlled.  She has transition from warfarin to Eliquis due to some labile INRs and the need for frequent antibiotics.  I think this is a good idea and try to get her to switch over to Eliquis for a long time.  This is less likely to interact with her medications and is superior to warfarin.  09/14/2022  Toni [name] is seen today in follow-up.  Unfortunately she has had issues heart failure as well as GI issues since I last saw her.  Mainly she has had issues  with recurrent this.  She cannot tolerate Entresto as I had switched her to this because of low blood pressure.  She was found to have  some cardiomyopathy with LVEF 45 to 50% on echo.  She has responded to increasing doses of torsemide and now is required midodrine for raising her blood pressure.  She says that her breathing has improved.  Weight is stayed stable if not improved somewhat.  She is not sure if she is actually getting her midodrine 3 times a day as scheduled or as needed.  She should maintain systolic blood pressure over 100.  PMHx:  Past Medical History:  Diagnosis Date   Abnormality of gait 03/01/2015   Anxiety    Arthritis    "knees" (01/19/2016)   Asthma    as a child    Atrial fibrillation ([place])    Back pain    Bilateral carpal tunnel syndrome 10/04/2020   Cancer ([place])    / RENAL CELL CARCINOMA - GETS CT SCAN EVERY 6 MONTHS TO WATCH    CHF (congestive heart failure) (H[facility])    Chronic pain    "nerve pain from the MS"   Complex atypical endometrial hyperplasia    Constipation    Depression    DVT (deep venous thrombosis) ([place]) 1983   "RLE; may have just been phlebitis; it was before the age of dopplers"   Dysrhythmia    afib    Edema    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' ablation of R GSV 2012   GERD (gastroesophageal reflux disease)    HLD (hyperlipidemia)    Hypertension    Hypotension    Hypothyroidism    Joint pain    Multiple sclerosis ([place])    has had this may 1989-[place] reg doc   OSA on CPAP    bipap machine - SLEEP APNEA WORSE SINCE COVID IN 1/21 PER PATIENT SETTING HAS INCREASED FROM 9 TO 30    Osteoarthritis    PAF (paroxysmal atrial fibrillation) ([place])    on coumadin; documented on monitor 06/2010 - no longer takes per [facility] 02/09/21   Pneumonia 11/2011   Sleep apnea    uses sleep apnea machine    Past Surgical History:  Procedure Laterality Date   ANTERIOR CERVICAL DECOMP/DISCECTOMY FUSION  01/2004   CARDIAC CATHETERIZATION  06/22/2010   normal coronary arteries, PAF   CATARACT EXTRACTION, BILATERAL Bilateral    CHILD BIRTHS     X2    DILATATION & CURETTAGE/HYSTEROSCOPY WITH MYOSURE N/A 01/15/2020   Procedure: DILATATION & CURETTAGE/HYSTEROSCOPY WITH MYOSURE;  Surgeon: Arvella Nigh, MD;  Location: [facility];  Service: Gynecology;  Laterality: N/A;  pt in wheelchair-has MS   DILATION AND CURETTAGE OF UTERUS     DILATION AND CURETTAGE OF UTERUS N/A 02/16/2020   Procedure: DILATATION AND CURETTAGE;  Surgeon: Lafonda Mosses, MD;  Location: [facility];  Service: Gynecology;  Laterality: N/A;  NOT GENERAL -NO INTUBATION   DNC     INFUSION PUMP IMPLANTATION  ~ 2009   baclofen infusion in lower abd   INTRATHECAL PUMP REVISION N/A 02/18/2021   Procedure: Baclofen Pump replacement with possible revision of catheter;  Surgeon: Erline Levine, MD;  Location: [facility];  Service: Neurosurgery;  Laterality: N/A;  3C/RM 20   INTRAUTERINE DEVICE (IUD) INSERTION N/A 02/16/2020   Procedure: INTRAUTERINE DEVICE (IUD) INSERTION - MIRENA;  Surgeon: Lafonda Mosses, MD;  Location: [facility];  Service: Gynecology;  Laterality: N/A;  IUD put in uterus     PAIN PUMP REVISION N/A 07/29/2014   Procedure: Baclofen pump replacement;  Surgeon: Erline Levine, MD;  Location: [place] [facility];  Service: Neurosurgery;  Laterality: N/A;  Baclofen pump replacement   TONSILLECTOMY AND ADENOIDECTOMY     VARICOSE VEIN SURGERY  ~ 1968   VENOUS ABLATION  12/16/2010   radiofreq ablation -Dr Elisabeth Cara and Venessa Wickham   WISDOM TOOTH EXTRACTION      FAMHx:  Family History  Problem Relation Age of Onset   Depression Mother    Sudden death Mother    Anxiety disorder Mother    Coronary artery disease Father        at age 85   Hyperlipidemia Father    Thyroid disease Father    Coronary artery disease Maternal Grandmother    Depression Maternal Grandmother    Cancer Paternal Grandmother        breast   Alcoholism Son    Sleep apnea Neg Hx    Multiple sclerosis Neg Hx     SOCHx:   reports that she quit smoking about 46 years ago. Her smoking use included cigarettes. She  has a 3.50 pack-year smoking history. She has never used smokeless tobacco. She reports that she does not currently use alcohol. She reports that she does not use drugs.  ALLERGIES:  Allergies  Allergen Reactions   Other Rash    Topical Surgical prep  - severe rash   Chlorhexidine Hives and Other (See Comments)    And, sores appear where applied   Hydrocodone Nausea And Vomiting and Other (See Comments)    Hydrocodone causes vomiting    Oxycodone Nausea And Vomiting and Other (See Comments)    Oral oxycodone causes vomiting   Sertraline Hives, Swelling, Rash and Other (See Comments)    [DERM][OTHER]RASH WITH SWELLING   Macrobid [Nitrofurantoin]     Other reaction(s): 3.9.17 nausea and vomitting,  NOT DOCUMENTED ON THE MAR   Phenol Hives    NOT DOCUMENTED ON THE MAR   Rofecoxib     Other reaction(s): Unknown  NOT DOCUMENTED ON THE MAR    ROS: Pertinent items noted in HPI and remainder of comprehensive ROS otherwise negative.  HOME MEDS: Current Outpatient Medications  Medication Sig Dispense Refill   acetaminophen (TYLENOL) 500 MG tablet Take 1,000 mg by mouth every 6 (six) hours as needed (fever/headache/pain).     ALPRAZolam (XANAX) 0.5 MG tablet Take 1 tablet (0.5 mg total) by mouth at bedtime. May also take 1 tablet every 12 hours as needed anxiety 10 tablet 0   ARTIFICIAL TEARS 0.2-0.2-1 % SOLN Place 2 drops into both eyes daily as needed (for dry eyes).     Benzethonium Chloride 0.13 % LIQD Apply 1 Application topically in the morning, at noon, and at bedtime. Use on groin area before to application of nystatin powder     Biotin 10000 MCG TABS Take 10,000 mcg by mouth daily after lunch.     bisacodyl (DULCOLAX) 10 MG suppository Place 10 mg rectally daily as needed for moderate constipation.     buPROPion (WELLBUTRIN XL) 150 MG 24 hr tablet Take 1 tablet (150 mg total) by mouth daily. (Patient taking differently: Take 150 mg by mouth daily. After breakfast to after lunch)  90 tablet 1   Calcium Carbonate-Vitamin D3 600-400 MG-UNIT TABS Take 1 tablet by mouth in the morning and at bedtime. (After breakfast & after lunch)     cetirizine (ZYRTEC) 10 MG tablet  Take 10 mg by mouth daily as needed for allergies.      Cholecalciferol (VITAMIN D3) 50 MCG (2000 UT) TABS Take 2,000 Units by mouth in the morning and at bedtime. (After breakfast & after lunch)     clotrimazole (LOTRIMIN) 1 % cream Apply 1 Application topically 2 (two) times daily. Mix with the ordered 1% hydrocortisone cream 1:1. Apply to perineal region and abdominal folds.     Coenzyme Q10 (COQ10 PO) Take 100 mg by mouth daily in the afternoon.     corticotropin (ACTHAR) 80 UNIT/ML injectable gel Inject 1 mL (80 Units total) into the skin every other day for 10 days. May give additional doses PRN. (Patient taking differently: Inject 80 Units into the skin as needed (for a boost fives times a month). Husband will bring in medication administer it in front of the nurse) 8 mL 3   D-Mannose 500 MG CAPS Take 500 mg by mouth every morning.     Dermatological Products, Misc. [facility] EX) Apply 1 Application topically every morning. Apply to affected areas of groin and inguinal creases     diclofenac Sodium (VOLTAREN) 1 % GEL Apply 2 g topically 3 (three) times daily as needed. (Patient taking differently: Apply 2 g topically 3 (three) times daily as needed (pain (applied to bilateral shoulders)).) 100 g 3   diphenhydrAMINE (BENADRYL) 25 mg capsule Take 25 mg by mouth as needed for itching (number of times per day unspecified on MAR).     ELIQUIS 5 MG TABS tablet Take 5 mg by mouth 2 (two) times daily.     Emollient (CERAVE MOISTURIZING) CREA Apply 1 Application topically in the morning.     escitalopram (LEXAPRO) 10 MG tablet Take 1 tablet (10 mg total) by mouth daily. (Patient taking differently: Take 10 mg by mouth at bedtime.) 90 tablet 1   famotidine (PEPCID) 20 MG tablet Take 20 mg by mouth at bedtime.      gabapentin (NEURONTIN) 300 MG capsule Mrs. Creason will take 300 mg of gabapentin at 6 AM, 10 AM, 2 PM and 6 PM and 600 mg at 10 PM by mouth. (Patient taking differently: Take 300 mg by mouth See admin instructions. Give 1 capsule by mouth at 0600, 1000, 1800, and 2200) 180 capsule 11   lactulose (CHRONULAC) 10 GM/15ML solution Take 15 mLs (10 g total) by mouth 2 (two) times daily as needed for mild constipation. (Patient taking differently: Take 10 g by mouth 2 (two) times daily.) 236 mL 0   levothyroxine (SYNTHROID, LEVOTHROID) 75 MCG tablet Take 75 mcg by mouth daily before breakfast. (0500)     metolazone (ZAROXOLYN) 2.5 MG tablet Take 2.5 mg by mouth daily as needed (for weight gain greater than 3 lbs in 2 days with swelling).     metoprolol succinate (TOPROL-XL) 25 MG 24 hr tablet Take 0.5 tablets (12.5 mg total) by mouth daily. Take with or immediately following a meal. 90 tablet 3   midodrine (PROAMATINE) 2.5 MG tablet Take 3 tablets (7.5 mg total) by mouth 3 (three) times daily with meals. (Patient taking differently: Take 7.5 mg by mouth 3 (three) times daily as needed (with meals for sbp <100).)     Multiple Vitamin (MULTIVITAMIN WITH MINERALS) TABS tablet Take 1 tablet by mouth daily after lunch.     NON FORMULARY ASV breathing machine is to be used while napping & at bedtime. Fill canister up to max line with distilled water and turn concentrator to 5LPM.  omeprazole (PRILOSEC) 40 MG capsule Take 1 capsule (40 mg total) by mouth every morning. 30 capsule 11   ondansetron (ZOFRAN-ODT) 4 MG disintegrating tablet Take 4 mg by mouth in the morning, at noon, in the evening, and at bedtime.     oxybutynin (DITROPAN) 5 MG tablet Take 5 mg by mouth daily as needed for bladder spasms (overactive bladder).     phenazopyridine (PYRIDIUM) 100 MG tablet Take 100 mg by mouth 3 (three) times daily as needed for pain.     phenylephrine-shark liver oil-mineral oil-petrolatum (PREPARATION H) 0.25-14-74.9 %  rectal ointment Place 1 Application rectally every 6 (six) hours as needed for hemorrhoids.     potassium chloride SA (KLOR-CON) 20 MEQ tablet Take 2 tablets (40 mEq total) by mouth daily. (Patient taking differently: Take 20 mEq by mouth 2 (two) times daily. (After lunch & after dinner)) 180 tablet 3   promethazine (PHENERGAN) 12.5 MG tablet Take 12.5 mg by mouth every 8 (eight) hours as needed for nausea or vomiting.     Prucalopride Succinate (MOTEGRITY) 2 MG TABS Take 1 tablet by mouth every morning.     psyllium (METAMUCIL SMOOTH TEXTURE) 28 % packet Take 1 packet by mouth 2 (two) times daily.     senna-docusate (SENOKOT-S) 8.6-50 MG tablet Take 2 tablets by mouth at bedtime. (Patient taking differently: Take 2 tablets by mouth 2 (two) times daily.)     simethicone (MYLICON) 80 MG chewable tablet Chew 160 mg by mouth 3 (three) times daily as needed for flatulence (indigestion).     simvastatin (ZOCOR) 10 MG tablet Take 10 mg by mouth at bedtime.     sodium phosphate (FLEET) 7-19 GM/118ML ENEM Place 1 enema rectally daily as needed for severe constipation.     SYRINGE/NEEDLE, DISP, 1 ML (BD LUER-LOK SYRINGE) 25G X 5/8" 1 ML MISC Use as directed to inject  Acthar 10 each prn   torsemide (DEMADEX) 20 MG tablet Take 2 tablets (40 mg total) by mouth daily. (Patient taking differently: Take 20 mg by mouth as directed. Take 1.5 tabs (30 mg) in am and 1 tab (20 MG) in evening)     zinc oxide 20 % ointment Apply 1 application topically as needed for irritation (apply labia every shift as needed for redness).     No current facility-administered medications for this visit.    LABS/IMAGING: No results found. However, due to the size of the patient record, not all encounters were searched. Please check Results Review for a complete set of results. No results found.  VITALS: BP 98/62   Pulse 70   Ht '5\' 3"'$  (1.6 m)   Wt 202 lb 12.8 oz (92 kg)   SpO2 96%   BMI 35.92 kg/m   EXAM: General  appearance: alert, no distress and moderately obese Neck: no carotid bruit, no JVD and thyroid not enlarged, symmetric, no tenderness/mass/nodules Lungs: clear to auscultation bilaterally Heart: irregularly irregular rhythm Abdomen: soft, non-tender; bowel sounds normal; no masses,  no organomegaly Extremities: edema Trace lower extremity Pulses: 2+ and symmetric Skin: Skin color, texture, turgor normal. No rashes or lesions Neurologic: Mental status: Alert, oriented, thought content appropriate Psych: Pleasant  EKG: Deferred  ASSESSMENT: Permanent atrial fibrillation, on Eliquis Morbid obesity Obstructive sleep apnea on BIPAP Multiple sclerosis Chronic venous insufficiency - +LGSV reflux Hypertension-controlled Noncardiac chest and left arm pain Non-ischemic cardiomyopathy - EF 50%, improved to 55% (29/4765) Chronic diastolic congestive heart failure  PLAN: 1.   Mrs. Fels had recent detection  of cardiomyopathy with LVEF down to 45 to 50%.  She cannot tolerate the addition of Entresto and has had issues with hypotension.  She is then started on midodrine but is not getting it regularly to her knowledge.  She will need to check on this with her facility.  She has improved with regards to diuresis.  BNP is down to 200 from over thousand.  She reports her swelling has improved.  She remains in rate controlled permanent A-fib.  No further changes are recommended today.  She did ask whether it would be okay to continue her Akhtar injections.  From what I can tell this is corticotropin which is mineralocorticoid and could lead to water retention however she seems to be well treated with diuretics despite this and if it is helpful for her MS, I would recommend continuing it.  Follow-up with me in 6 months or sooner as necessary.  Pixie Casino, MD, [facility], [place] Director of the Advanced Lipid Disorders &  Cardiovascular Risk Reduction Clinic Diplomate  of the American Board of Clinical Lipidology Attending Cardiologist  Direct Dial: [phone]  Fax: [phone]  Website:  [email or web address] 09/14/2022, 2:34 PM

## 2022-09-14 NOTE — Patient Instructions (Addendum)
Medication Instructions:  Your physician recommends that you continue on your current medications as directed. Please refer to the Current Medication list given to you today.  *If you need a refill on your cardiac medications before your next appointment, please call your pharmacy*   Follow-Up: At [facility], you and your health needs are our priority.  As part of our continuing mission to provide you with exceptional heart care, we have created designated Provider Care Teams.  These Care Teams include your primary Cardiologist (physician) and Advanced Practice Providers (APPs -  Physician Assistants and Nurse Practitioners) who all work together to provide you with the care you need, when you need it.  We recommend signing up for the patient portal called "MyChart".  Sign up information is provided on this After Visit Summary.  MyChart is used to connect with patients for Virtual Visits (Telemedicine).  Patients are able to view lab/test results, encounter notes, upcoming appointments, etc.  Non-urgent messages can be sent to your provider as well.   To learn more about what you can do with MyChart, go to [email or web address].    Your next appointment:   6 month(s)  Provider:   Pixie Casino, MD     Other Instructions Double check that you are getting midodrine (proamatine) three times a day for systolic blood pressure less than 174mHg  Pt should have blood pressure taken manually twice a day.

## 2022-10-05 ENCOUNTER — Ambulatory Visit: Payer: Medicare Other | Admitting: Neurology

## 2022-10-09 ENCOUNTER — Ambulatory Visit (INDEPENDENT_AMBULATORY_CARE_PROVIDER_SITE_OTHER): Payer: Medicare Other | Admitting: Neurology

## 2022-10-09 ENCOUNTER — Telehealth: Payer: Self-pay | Admitting: Neurology

## 2022-10-09 VITALS — Ht 63.0 in

## 2022-10-09 DIAGNOSIS — Z978 Presence of other specified devices: Secondary | ICD-10-CM | POA: Diagnosis not present

## 2022-10-09 DIAGNOSIS — G35 Multiple sclerosis: Secondary | ICD-10-CM | POA: Diagnosis not present

## 2022-10-09 DIAGNOSIS — G35D Multiple sclerosis, unspecified: Secondary | ICD-10-CM

## 2022-10-09 DIAGNOSIS — G4733 Obstructive sleep apnea (adult) (pediatric): Secondary | ICD-10-CM | POA: Diagnosis not present

## 2022-10-09 DIAGNOSIS — R252 Cramp and spasm: Secondary | ICD-10-CM

## 2022-10-09 DIAGNOSIS — R29898 Other symptoms and signs involving the musculoskeletal system: Secondary | ICD-10-CM

## 2022-10-09 NOTE — Progress Notes (Signed)
[facility]  PATIENT: Toni [name] DOB: Nov 10, 1945  REFERRING DOCTOR OR PCP: Dr. Brett Fairy SOURCE: Patient, notes from Dr. Brett Fairy and Dr. [name]  _________________________________   HISTORICAL  CHIEF COMPLAINT:  Chief Complaint  Patient presents with   Baclofen pump refill    RM 11.     HISTORY OF PRESENT ILLNESS:  Toni [name] is a 77 year old woman with multiple sclerosis.  .    She comes in today and would need a baclofen pump refill..  She had her pump replaced in 2022 (Dr. Vertell Limber).     She is in [facility] [place].   She she feels baseline today.  However, she had RSV a couple weeks ago and was more wiped out last week.  When she had a fever her weakness and spasticity was worse.   She does take p.o. baclofen as needed when spasticity is a little worse and she took a couple times last week.  She is written for the 20 mg pills and I discussed that if that makes her sleepy she can take half of the tablet several times a day.    She continues to get a benefit from the baclofen pump.  She is on third pump - first one 2009, last one July 2022).  The 2023, we increased the pump settings by 5% to 829.6 mcg/day.   That dose has worked out well for her.  She feels the current control her spasticity is doing well and is happy with the current pump settings and does not feel she needs to make any adjustment.  She is unable to walk but assist some with transfers.  She is able to move her legs in bed and to go on her side in bed.  She also notes weakness in her hands.  She has mild dysesthesias in her abdomen and legs.  Gabapentin has helped.     She has difficulty with a neurogenic bladder and has frequent UTIs.  Spasticity is worse when she is sick.  Vision is doing well. She wasreently hospitalized for UTI (not sepsis).     REVIEW OF SYSTEMS: Constitutional: No fevers, chills, sweats, or change in appetite Eyes: No visual changes, double  vision, eye pain Ear, nose and throat: No hearing loss, ear pain, nasal congestion, sore throat Cardiovascular: No chest pain, palpitations Respiratory:  No shortness of breath at rest or with exertion.   No wheezes GastrointestinaI: No nausea, vomiting, diarrhea, abdominal pain, fecal incontinence Genitourinary:  No dysuria, urinary retention or frequency.  No nocturia. Musculoskeletal:  No neck pain, back pain Integumentary: No rash, pruritus, skin lesions Neurological: as above Psychiatric: No depression at this time.  No anxiety Endocrine: No palpitations, diaphoresis, change in appetite, change in weigh or increased thirst Hematologic/Lymphatic:  No anemia, purpura, petechiae. Allergic/Immunologic: No itchy/runny eyes, nasal congestion, recent allergic reactions, rashes  ALLERGIES: Allergies  Allergen Reactions   Other Rash    Topical Surgical prep  - severe rash   Chlorhexidine Hives and Other (See Comments)    And, sores appear where applied   Hydrocodone Nausea And Vomiting and Other (See Comments)    Hydrocodone causes vomiting    Oxycodone Nausea And Vomiting and Other (See Comments)    Oral oxycodone causes vomiting   Sertraline Hives, Swelling, Rash and Other (See Comments)    [DERM][OTHER]RASH WITH SWELLING   Macrobid [Nitrofurantoin]     Other reaction(s): 3.9.17 nausea and vomitting,  NOT DOCUMENTED ON THE MAR   Phenol  Hives    NOT DOCUMENTED ON THE MAR   Rofecoxib     Other reaction(s): Unknown  NOT DOCUMENTED ON THE MAR    HOME MEDICATIONS:  Current Outpatient Medications:    acetaminophen (TYLENOL) 500 MG tablet, Take 1,000 mg by mouth every 6 (six) hours as needed (fever/headache/pain)., Disp: , Rfl:    ALPRAZolam (XANAX) 0.5 MG tablet, Take 1 tablet (0.5 mg total) by mouth at bedtime. May also take 1 tablet every 12 hours as needed anxiety, Disp: 10 tablet, Rfl: 0   ARTIFICIAL TEARS 0.2-0.2-1 % SOLN, Place 2 drops into both eyes daily as needed (for  dry eyes)., Disp: , Rfl:    baclofen (LIORESAL) 20 MG tablet, Take 20 mg by mouth daily as needed for muscle spasms., Disp: , Rfl:    Biotin 10000 MCG TABS, Take 10,000 mcg by mouth daily after lunch., Disp: , Rfl:    bisacodyl (DULCOLAX) 10 MG suppository, Place 10 mg rectally daily as needed for moderate constipation., Disp: , Rfl:    buPROPion (WELLBUTRIN XL) 150 MG 24 hr tablet, Take 1 tablet (150 mg total) by mouth daily. (Patient taking differently: Take 150 mg by mouth daily. After breakfast to after lunch), Disp: 90 tablet, Rfl: 1   Calcium Carbonate-Vitamin D3 600-400 MG-UNIT TABS, Take 1 tablet by mouth in the morning and at bedtime. (After breakfast & after lunch), Disp: , Rfl:    cetirizine (ZYRTEC) 10 MG tablet, Take 10 mg by mouth daily as needed for allergies. , Disp: , Rfl:    Cholecalciferol (VITAMIN D3) 50 MCG (2000 UT) TABS, Take 2,000 Units by mouth in the morning and at bedtime. (After breakfast & after lunch), Disp: , Rfl:    Coenzyme Q10 (COQ10 PO), Take 100 mg by mouth daily in the afternoon., Disp: , Rfl:    corticotropin (ACTHAR) 80 UNIT/ML injectable gel, Inject 1 mL (80 Units total) into the skin every other day for 10 days. May give additional doses PRN. (Patient taking differently: Inject 80 Units into the skin as needed (for a boost fives times a month). Husband will bring in medication administer it in front of the nurse), Disp: 8 mL, Rfl: 3   D-Mannose 500 MG CAPS, Take 500 mg by mouth every morning., Disp: , Rfl:    Dermatological Products, Misc. [facility] EX), Apply 1 Application topically every morning. Apply to affected areas of groin and inguinal creases, Disp: , Rfl:    diclofenac Sodium (VOLTAREN) 1 % GEL, Apply 2 g topically 3 (three) times daily as needed. (Patient taking differently: Apply 2 g topically 3 (three) times daily as needed (pain (applied to bilateral shoulders)).), Disp: 100 g, Rfl: 3   diphenhydrAMINE (BENADRYL) 25 mg capsule, Take 25 mg by mouth  as needed for itching (number of times per day unspecified on MAR)., Disp: , Rfl:    ELIQUIS 5 MG TABS tablet, Take 5 mg by mouth 2 (two) times daily., Disp: , Rfl:    Emollient (CERAVE MOISTURIZING) CREA, Apply 1 Application topically in the morning., Disp: , Rfl:    escitalopram (LEXAPRO) 10 MG tablet, Take 1 tablet (10 mg total) by mouth daily. (Patient taking differently: Take 10 mg by mouth at bedtime.), Disp: 90 tablet, Rfl: 1   famotidine (PEPCID) 20 MG tablet, Take 20 mg by mouth at bedtime., Disp: , Rfl:    gabapentin (NEURONTIN) 300 MG capsule, Toni [name] will take 300 mg of gabapentin at 6 AM, 10 AM, 2 PM and 6 PM  and 600 mg at 10 PM by mouth. (Patient taking differently: Take 300 mg by mouth See admin instructions. Give 1 capsule by mouth at 0600, 1000, 1800, and 2200), Disp: 180 capsule, Rfl: 11   lactulose (CHRONULAC) 10 GM/15ML solution, Take 15 mLs (10 g total) by mouth 2 (two) times daily as needed for mild constipation. (Patient taking differently: Take 10 g by mouth 2 (two) times daily.), Disp: 236 mL, Rfl: 0   levothyroxine (SYNTHROID, LEVOTHROID) 75 MCG tablet, Take 75 mcg by mouth daily before breakfast. (0500), Disp: , Rfl:    metolazone (ZAROXOLYN) 2.5 MG tablet, Take 2.5 mg by mouth daily as needed (for weight gain greater than 3 lbs in 2 days with swelling)., Disp: , Rfl:    metoprolol succinate (TOPROL-XL) 25 MG 24 hr tablet, Take 0.5 tablets (12.5 mg total) by mouth daily. Take with or immediately following a meal., Disp: 90 tablet, Rfl: 3   midodrine (PROAMATINE) 2.5 MG tablet, Take 3 tablets (7.5 mg total) by mouth 3 (three) times daily with meals. (Patient taking differently: Take 7.5 mg by mouth 3 (three) times daily as needed (with meals for sbp <100).), Disp: , Rfl:    Multiple Vitamin (MULTIVITAMIN WITH MINERALS) TABS tablet, Take 1 tablet by mouth daily after lunch., Disp: , Rfl:    NON FORMULARY, ASV breathing machine is to be used while napping & at bedtime. Fill  canister up to max line with distilled water and turn concentrator to 5LPM., Disp: , Rfl:    omeprazole (PRILOSEC) 40 MG capsule, Take 1 capsule (40 mg total) by mouth every morning., Disp: 30 capsule, Rfl: 11   ondansetron (ZOFRAN-ODT) 4 MG disintegrating tablet, Take 4 mg by mouth in the morning, at noon, in the evening, and at bedtime., Disp: , Rfl:    oxybutynin (DITROPAN) 5 MG tablet, Take 5 mg by mouth daily as needed for bladder spasms (overactive bladder)., Disp: , Rfl:    phenazopyridine (PYRIDIUM) 100 MG tablet, Take 100 mg by mouth 3 (three) times daily as needed for pain., Disp: , Rfl:    phenylephrine-shark liver oil-mineral oil-petrolatum (PREPARATION H) 0.25-14-74.9 % rectal ointment, Place 1 Application rectally every 6 (six) hours as needed for hemorrhoids., Disp: , Rfl:    potassium chloride SA (KLOR-CON) 20 MEQ tablet, Take 2 tablets (40 mEq total) by mouth daily. (Patient taking differently: Take 20 mEq by mouth 2 (two) times daily. (After lunch & after dinner)), Disp: 180 tablet, Rfl: 3   Prucalopride Succinate (MOTEGRITY) 2 MG TABS, Take 1 tablet by mouth every morning., Disp: , Rfl:    psyllium (METAMUCIL SMOOTH TEXTURE) 28 % packet, Take 1 packet by mouth 2 (two) times daily., Disp: , Rfl:    senna-docusate (SENOKOT-S) 8.6-50 MG tablet, Take 2 tablets by mouth at bedtime. (Patient taking differently: Take 2 tablets by mouth 2 (two) times daily.), Disp: , Rfl:    simethicone (MYLICON) 80 MG chewable tablet, Chew 160 mg by mouth 3 (three) times daily as needed for flatulence (indigestion)., Disp: , Rfl:    simvastatin (ZOCOR) 10 MG tablet, Take 10 mg by mouth at bedtime., Disp: , Rfl:    sodium phosphate (FLEET) 7-19 GM/118ML ENEM, Place 1 enema rectally daily as needed for severe constipation., Disp: , Rfl:    SYRINGE/NEEDLE, DISP, 1 ML (BD LUER-LOK SYRINGE) 25G X 5/8" 1 ML MISC, Use as directed to inject  Acthar, Disp: 10 each, Rfl: prn   torsemide (DEMADEX) 20 MG tablet, Take 2  tablets (  40 mg total) by mouth daily. (Patient taking differently: Take 20 mg by mouth as directed. Take 1.5 tabs (30 mg) in am and 1 tab (20 MG) in evening), Disp: , Rfl:    zinc oxide 20 % ointment, Apply 1 application topically as needed for irritation (apply labia every shift as needed for redness)., Disp: , Rfl:   PAST MEDICAL HISTORY: Past Medical History:  Diagnosis Date   Abnormality of gait 03/01/2015   Anxiety    Arthritis    "knees" (01/19/2016)   Asthma    as a child    Atrial fibrillation ([place])    Back pain    Bilateral carpal tunnel syndrome 10/04/2020   Cancer ([place])    / RENAL CELL CARCINOMA - GETS CT SCAN EVERY 6 MONTHS TO WATCH    CHF (congestive heart failure) (HCC)    Chronic pain    "nerve pain from the MS"   Complex atypical endometrial hyperplasia    Constipation    Depression    DVT (deep venous thrombosis) ([place]) 1983   "RLE; may have just been phlebitis; it was before the age of dopplers"   Dysrhythmia    afib    Edema    varicose veins with severe venous insuff in R and L GSV' ablation of R GSV 2012   GERD (gastroesophageal reflux disease)    HLD (hyperlipidemia)    Hypertension    Hypotension    Hypothyroidism    Joint pain    Multiple sclerosis ([place])    has had this may 1989-[place] reg doc   OSA on CPAP    bipap machine - SLEEP APNEA WORSE SINCE COVID IN 1/21 PER PATIENT SETTING HAS INCREASED FROM 9 TO 30    Osteoarthritis    PAF (paroxysmal atrial fibrillation) ([place])    on coumadin; documented on monitor 06/2010 - no longer takes per [facility] 02/09/21   Pneumonia 11/2011   Sleep apnea    uses sleep apnea machine    PAST SURGICAL HISTORY: Past Surgical History:  Procedure Laterality Date   ANTERIOR CERVICAL DECOMP/DISCECTOMY FUSION  01/2004   CARDIAC CATHETERIZATION  06/22/2010   normal coronary arteries, PAF   CATARACT EXTRACTION, BILATERAL Bilateral    CHILD BIRTHS     X2   DILATATION & CURETTAGE/HYSTEROSCOPY WITH MYOSURE N/A  01/15/2020   Procedure: DILATATION & CURETTAGE/HYSTEROSCOPY WITH MYOSURE;  Surgeon: Arvella Nigh, MD;  Location: [facility];  Service: Gynecology;  Laterality: N/A;  pt in wheelchair-has MS   DILATION AND CURETTAGE OF UTERUS     DILATION AND CURETTAGE OF UTERUS N/A 02/16/2020   Procedure: DILATATION AND CURETTAGE;  Surgeon: Lafonda Mosses, MD;  Location: [facility];  Service: Gynecology;  Laterality: N/A;  NOT GENERAL -NO INTUBATION   DNC     INFUSION PUMP IMPLANTATION  ~ 2009   baclofen infusion in lower abd   INTRATHECAL PUMP REVISION N/A 02/18/2021   Procedure: Baclofen Pump replacement with possible revision of catheter;  Surgeon: Erline Levine, MD;  Location: [facility];  Service: Neurosurgery;  Laterality: N/A;  3C/RM 20   INTRAUTERINE DEVICE (IUD) INSERTION N/A 02/16/2020   Procedure: INTRAUTERINE DEVICE (IUD) INSERTION - MIRENA;  Surgeon: Lafonda Mosses, MD;  Location: [facility];  Service: Gynecology;  Laterality: N/A;   IUD put in uterus     PAIN PUMP REVISION N/A 07/29/2014   Procedure: Baclofen pump replacement;  Surgeon: Erline Levine, MD;  Location: [facility];  Service: Neurosurgery;  Laterality: N/A;  Baclofen pump replacement   TONSILLECTOMY AND ADENOIDECTOMY     VARICOSE VEIN SURGERY  ~ 1968   VENOUS ABLATION  12/16/2010   radiofreq ablation -Dr Elisabeth Cara and [facility]   WISDOM TOOTH EXTRACTION      FAMILY HISTORY: Family History  Problem Relation Age of Onset   Depression Mother    Sudden death Mother    Anxiety disorder Mother    Coronary artery disease Father        at age 9   Hyperlipidemia Father    Thyroid disease Father    Coronary artery disease Maternal Grandmother    Depression Maternal Grandmother    Cancer Paternal Grandmother        breast   Alcoholism Son    Sleep apnea Neg Hx    Multiple sclerosis Neg Hx     SOCIAL HISTORY:  Social History   Socioeconomic History   Marital status: Married    Spouse name: [name]   Number of children: 2   Years of  education: COLLEGE   Highest education level: Not on file  Occupational History   Occupation: RETIRED  Tobacco Use   Smoking status: Former    Packs/day: 0.50    Years: 7.00    Total pack years: 3.50    Types: Cigarettes    Quit date: 08/21/1976    Years since quitting: 46.1   Smokeless tobacco: Never  Vaping Use   Vaping Use: Never used  Substance and Sexual Activity   Alcohol use: Not Currently   Drug use: No   Sexual activity: Not Currently    Birth control/protection: Post-menopausal  Other Topics Concern   Not on file  Social History Narrative   Lives in [place] with Husband [name] [phone], [phone]   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   Right handed   Drinks a little caffeine in coffee sometimes    Social Determinants of Health   Financial Resource Strain: Not on file  Food Insecurity: No Food Insecurity (07/14/2022)   Hunger Vital Sign    Worried About Running Out of Food in the Last Year: Never true    Ran Out of Food in the Last Year: Never true  Transportation Needs: No Transportation Needs (07/15/2022)   PRAPARE - Hydrologist (Medical): No    Lack of Transportation (Non-Medical): No  Physical Activity: Not on file  Stress: Not on file  Social Connections: Not on file  Intimate Partner Violence: Not At Risk (07/15/2022)   Humiliation, Afraid, Rape, and Kick questionnaire    Fear of Current or Ex-Partner: No    Emotionally Abused: No    Physically Abused: No    Sexually Abused: No     PHYSICAL EXAM  Vitals:   10/09/22 1359  Height: 5' 3"$  (1.6 m)     Body mass index is 35.92 kg/m.   General: The patient is well-developed and well-nourished and in no acute distress  HEENT:  Head is [place]/AT.    Neurologic Exam  Mental status: The patient is alert and oriented x 3 at the time of the examination. The patient has apparent normal recent and remote memory, with an apparently normal attention span and concentration ability.    .  Cranial nerves: Extraocular movements are full.  Facial strength was normal  Motor:  Muscle bulk reduced at the thenar eminences.  Muscle tone was just mildly increased in the legs.  She has reduced grip bilaterally and 3/5 strength in the intrinsic hand  muscles.  Strength is 3/5 in the left leg again 2+/5 in the right leg.  Sensory: Sensory testing is intact to touch x4.\  Gait and station: She is unable to stand or walk.   Reflexes: Deep tendon reflexes are symmetric in the arms and absent in the legs.  No ankle clonus.    DIAGNOSTIC DATA (LABS, IMAGING, TESTING) - I reviewed patient records, labs, notes, testing and imaging myself where available.  Lab Results  Component Value Date   WBC 7.1 07/16/2022   HGB 11.8 (L) 07/16/2022   HCT 34.2 (L) 07/16/2022   MCV 91.0 07/16/2022   PLT 181 07/16/2022      Component Value Date/Time   NA 137 07/16/2022 0319   NA 144 12/15/2020 1100   K 3.5 07/16/2022 0319   CL 93 (L) 07/16/2022 0319   CO2 31 07/16/2022 0319   GLUCOSE 83 07/16/2022 0319   BUN 15 07/16/2022 0319   BUN 27 12/15/2020 1100   CREATININE 0.59 07/16/2022 0319   CALCIUM 8.9 07/16/2022 0319   PROT 5.5 (L) 07/13/2022 2030   PROT 5.9 (L) 09/20/2020 1436   ALBUMIN 3.0 (L) 07/13/2022 2030   ALBUMIN 3.9 09/20/2020 1436   AST 20 07/13/2022 2030   ALT 7 07/13/2022 2030   ALKPHOS 63 07/13/2022 2030   BILITOT 1.1 07/13/2022 2030   BILITOT 0.3 09/20/2020 1436   GFRNONAA >60 07/16/2022 0319   GFRAA 90 09/20/2020 1436   Lab Results  Component Value Date   CHOL 111 03/19/2019   HDL 57 03/19/2019   LDLCALC 43 03/19/2019   TRIG 53 03/19/2019   CHOLHDL 3.0 04/22/2010   Lab Results  Component Value Date   HGBA1C 4.9 06/05/2020   No results found for: "VITAMINB12" Lab Results  Component Value Date   TSH 2.961 01/03/2021       ASSESSMENT AND PLAN  Multiple sclerosis (HCC)  Spasticity  Presence of intrathecal pump  OSA on CPAP  Bilateral leg  weakness   The baclofen pump was refilled with baclofen at 2000 mg per ml.   Continue simple continuous rate of 829.6 mcg/day. She will return in about 3 months for next pump refill.   F/u with Dr. Brett Fairy for other [place] / neuro issues  Toni Fitzgerald A. Felecia Shelling, MD, [facility] [id number], [id number] PM Certified in Neurology, Clinical Neurophysiology, Sleep Medicine and Neuroimaging  [facility] [address], [place], [place] [address] [phone]

## 2022-10-09 NOTE — Telephone Encounter (Signed)
Pt is scheduled 12/25/22 with Dr Brett Fairy but is due for her baclofen pump refill and it also falls on that day. The patient can't have both apts and since she will be seeing Dr Felecia Shelling at least we could push her 5/6 apt out a little further. She saw [name], NP in August. I would offer to Toni [name] her preference but she can be rescheduled anytime between now and August. Please reschedule Toni [name] 5/6 [place] apt to another Dr Brett Fairy slot.  Due to not being able to have apt on same day

## 2022-10-09 NOTE — Progress Notes (Signed)
RM 11, with husband. Skin prepped with Betadine and draped in sterile fashion. Center port of implanted pump accessed using 22g huber needle.  Residual fluid measuring 9 ml was removed from the reservoir.  Gablofen 2028mg/mlx2 instilled into pump. Pump reprogrammed to reflect a volume of 449m  ERI is 10/2027. Next appt given for 12/25/22 at 2:30pm.  Next alarm date: 01/08/23.    Medication placed in pump: Gablofen 40,00028m20ml x2. Lot: [id number]: 02/2025. [phone]

## 2022-10-09 NOTE — Telephone Encounter (Signed)
Pt was r/s for the next week.

## 2022-10-09 NOTE — Telephone Encounter (Signed)
Noted, thank you

## 2022-11-16 ENCOUNTER — Non-Acute Institutional Stay: Payer: Medicare Other | Admitting: Family Medicine

## 2022-11-16 ENCOUNTER — Encounter: Payer: Self-pay | Admitting: Family Medicine

## 2022-11-16 VITALS — HR 63 | Temp 98.6°F | Resp 20

## 2022-11-16 DIAGNOSIS — I5032 Chronic diastolic (congestive) heart failure: Secondary | ICD-10-CM

## 2022-11-16 DIAGNOSIS — N3000 Acute cystitis without hematuria: Secondary | ICD-10-CM

## 2022-11-16 DIAGNOSIS — G35 Multiple sclerosis: Secondary | ICD-10-CM

## 2022-11-16 NOTE — Progress Notes (Unsigned)
[facility] Consult Note Telephone: [phone]  Fax: [phone]   Date of encounter: 11/16/22 12:32 PM PATIENT NAME: Toni [name] [name] [place] [address]   [phone] (home)  DOB: 24-Sep-1945 MRN: [id number] PRIMARY CARE PROVIDER:    Jodi Marble, MD,  [place] [address] [phone]  REFERRING PROVIDER:   Jodi Marble, MD [address] [place],  [place] [address] [phone]  Health Care Agent/Health Care Power of Attorney:    Contact Information     Name Relation Home Work Mobile   Witz,Kristin Daughter [phone]  [phone]   Ryther,Ronald Spouse [phone]  [phone]   Beecher Other [phone]  [phone]        I met face to face with patient, daughter Toni [name] and paid caregiver Toni [name] in [place]. Palliative Care was asked to follow this patient by consultation request of Jodi Marble, MD to address advance care planning and complex medical decision making. This is a follow up visit.  ADVANCE CARE PLANNING/GOALS OF CARE: Health care surrogate gave her permission to discuss.   PRESENT FOR DISCUSSION: Toni Hippo FNP-C and daughter Toni [name]  GOALS: Full code per patient's wishes BARRIERS: patient is not ready to discuss comfort care   CODE STATUS: Full code   ASSESSMENT AND / RECOMMENDATIONS:  PPS: [id number]  Chronic diastolic CHF Acute decompensation resolving. Continue daily weights and every other day 2.5 mg Metolazone, Metoprolol succinate 25 mg daily. Completing 3rd day of Torsemide 40 mg BID, will resume 40 mg daily in am.   Acute cystitis without hematuria Resolved, completed course of Ertapenem for MDRO. Has chronic catheter. Recommend monthly cath changes and treatment only with culture confirmed UTI.   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    Follow up Palliative Care Visit:  Palliative Care continuing to follow up by monitoring for changes in appetite, weight, functional and cognitive status for chronic disease progression and management in agreement with patient's stated goals of care. Next visit in 3-4 weeks or prn.  This visit was coded based on medical decision making (MDM).  Chief Complaint  Recent heart failure decompensation and UTI.  HISTORY OF PRESENT ILLNESS:  Toni "[name]" is a 77 y.o. year old female with multiple sclerosis . Pt states she knows she is having more trouble with heart failure when she has more "bloating" in her abdomen.  She reports having had vascular US of her BLE to rule out DVT.  She states having more redness in her posterior calf on the left a few days ago.  She has had recent IV Ertapenem for resistant UTI and dose of torsemide increased daily x 3 days with last day today.  Denies pain, SOB, PND, CP.  Has had intermittent nausea as her presenting symptom for long covid since having had covid.  No vomiting.  Has hx of vein stripping and worse edema of RLE always.  Keeps her BLE elevated and has a lift chair.  Has specialized utensils to allow her more independence in feeding herself. Had some wheezing earlier in the week which resolved with addition of Flonase, Zyrtec and Guaifenesin.  Had CXR done which was normal.   ACTIVITIES OF DAILY LIVING: CONTINENT OF BLADDER/BOWEL? Y/N BATHING/Dressing/ Feeding  MOBILITY:   WHEELCHAIR/BEDBOUND  APPETITE? good  CURRENT PROBLEM LIST:  Patient Active Problem List  Diagnosis Date Noted   (HFpEF) heart failure with preserved ejection fraction (HCC) 07/14/2022   Hyponatremia 07/13/2022   Cushing's syndrome, ACTH dependent ([place]) 09/12/2021   Presence of intrathecal pump 08/18/2021   Spasticity 02/18/2021   Bladder spasms 01/04/2021   UTI (urinary tract infection) 01/03/2021   Bilateral carpal tunnel syndrome  10/04/2020   Multiple sclerosis, 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[place]) 09/13/2020   Chronic intermittent hypoxia with obstructive sleep apnea 09/13/2020   COVID-19 long hauler manifesting chronic dyspnea 09/13/2020   Chronic diastolic CHF (congestive heart failure) ([place]) 09/13/2020   Acute respiratory failure with hypoxia ([place]) 09/13/2020   Class 2 severe obesity with serious comorbidity and body mass index (BMI) of 35.0 to 35.9 in adult ([place]) 09/13/2020   Sepsis ([place]) [id number]   Acute metabolic encephalopathy A999333   Atrial fibrillation with RVR ([place]) 04/07/2020   Recurrent UTI [id number]   Uncomplicated degenerative myopia of both eyes 03/19/2020   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/2020   Oxygen dependent 02/26/2020   Neurogenic hypoventilation 02/26/2020   Complex atypical endometrial hyperplasia    Acute on chronic respiratory failure with hypoxia ([place]) 10/10/2019   Multiple sclerosis ([place]) 10/10/2019   COVID-19 virus infection 08/23/2019   Bilateral leg weakness 08/04/2019   Weakness 08/04/2019   Unilateral primary osteoarthritis, left knee 01/09/2019   Obstructive sleep apnea treated with bilevel positive airway pressure (BiPAP) 08/15/2018   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 ([place]) 11/21/2017   Permanent atrial fibrillation ([place]) 11/21/2017   Tightness of leg fascia 11/21/2017   Muscle spasm of both lower legs 11/21/2017   Carpal tunnel syndrome, left upper limb 09/13/2017   Carpal tunnel syndrome, right upper limb 09/13/2017   Other insomnia 06/11/2017   Depression with anxiety 06/11/2017   Vitamin D deficiency 03/22/2017   Right hand weakness 08/06/2016   Nonischemic cardiomyopathy ([place]) 05/09/2016   Localized edema 05/09/2016   Long term current use of anticoagulant therapy 02/03/2016   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   Obesity hypoventilation syndrome ([place]) 03/30/2015   Abnormality of gait 03/01/2015   Chronic  constipation with overflow incontinence 01/25/2015   ACTH dependent Cushing's syndrome ([place]) 01/25/2015   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) ([place]) 01/25/2015   Neurogenic urinary bladder disorder 08/24/2014   Varicose veins of both lower extremities with complications 0000000   Pseudophakia of both eyes 02/07/2012   Multiple sclerosis, 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 11/26/2011   Hypothyroidism    HLD (hyperlipidemia)    PAST MEDICAL HISTORY:  Active Ambulatory Problems    Diagnosis Date Noted   Multiple sclerosis, 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 11/26/2011   Hypothyroidism    HLD (hyperlipidemia)    Varicose veins of both lower extremities with complications 0000000   Neurogenic urinary bladder disorder 08/24/2014   Chronic constipation with overflow incontinence 01/25/2015   ACTH dependent Cushing's syndrome ([place]) 01/25/2015   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) ([place]) 01/25/2015   Abnormality of gait 03/01/2015   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   Obesity hypoventilation syndrome ([place]) 03/30/2015   Long term current use of anticoagulant therapy 02/03/2016   Nonischemic cardiomyopathy ([place]) 05/09/2016   Localized edema 05/09/2016   Right hand weakness 08/06/2016   Vitamin D deficiency 03/22/2017   Other insomnia 06/11/2017   Depression with anxiety 06/11/2017   Carpal tunnel syndrome, left upper limb 09/13/2017   Carpal tunnel syndrome, right upper limb 09/13/2017  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 ([place]) 11/21/2017   Permanent atrial fibrillation ([place]) 11/21/2017   Tightness of leg fascia 11/21/2017   Muscle spasm of both lower legs 11/21/2017   Obstructive sleep apnea treated with bilevel positive airway pressure (BiPAP) 08/15/2018   Unilateral primary osteoarthritis, left knee 01/09/2019   Bilateral leg weakness 08/04/2019    Weakness 08/04/2019   COVID-19 virus infection 08/23/2019   Acute on chronic respiratory failure with hypoxia ([place]) 10/10/2019   Multiple sclerosis ([place]) 10/10/2019   Complex atypical endometrial hyperplasia    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/2020   Oxygen dependent 02/26/2020   Neurogenic hypoventilation 02/26/2020   Atrial fibrillation with RVR ([place]) 04/07/2020   Recurrent UTI 04/07/2020   Sepsis ([place]) [id number]   Acute metabolic encephalopathy A999333   Multiple sclerosis, 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[place]) 09/13/2020   Chronic intermittent hypoxia with obstructive sleep apnea 09/13/2020   COVID-19 long hauler manifesting chronic dyspnea 09/13/2020   Chronic diastolic CHF (congestive heart failure) ([place]) 09/13/2020   Acute respiratory failure with hypoxia ([place]) 09/13/2020   Class 2 severe obesity with serious comorbidity and body mass index (BMI) of 35.0 to 35.9 in adult [facility]) 09/13/2020   Bilateral carpal tunnel syndrome 10/04/2020   Pseudophakia of both eyes [id number]   Uncomplicated degenerative myopia of both eyes 03/19/2020   UTI (urinary tract infection) 01/03/2021   Bladder spasms 01/04/2021   Spasticity 02/18/2021   Presence of intrathecal pump 08/18/2021   Cushing's syndrome, ACTH dependent ([place]) 09/12/2021   Hyponatremia 07/13/2022   (HFpEF) heart failure with preserved ejection fraction ([place]) 07/14/2022   Resolved Ambulatory Problems    Diagnosis Date Noted   Aspiration pneumonia vs Failed outpatient Rx of CAP 11/26/2011   Essential hypertension    Atrial fibrillation ([place])    Long term (current) use of anticoagulants 11/05/2012   Paroxysmal A-fib ([place]) 06/04/2013   Morbid obesity ([place]) 06/04/2013   OSA on CPAP 06/04/2013   Closed right ankle fracture 10/30/2013   Ankle fracture 10/30/2013   MS (multiple sclerosis) ([place]) 07/29/2014   Acute cystitis with hematuria 08/24/2014   Medication monitoring encounter 01/25/2015   Medication course changed 06/02/2015    Dysenteric diarrhea 10/04/2015   Chest pain 01/19/2016   Chronic atrial fibrillation ([place]) 01/19/2016   Combined systolic and diastolic congestive heart failure ([place]) 01/19/2016   Atrial fibrillation with RVR ([place]) 01/19/2016   Persistent atrial fibrillation with RVR ([place]) 01/19/2016   Chest pain at rest 02/03/2016   Atrial fibrillation with rapid ventricular response ([place]) 08/06/2016   Pressure injury of skin 08/06/2016   Dyspnea    Fever    Weakness 12/24/2016   Multiple sclerosis ([employer]) 12/25/2016   Class 3 obesity with serious comorbidity and body mass index (BMI) of 40.0 to 44.9 in adult 03/22/2017   Prediabetes 03/22/2017   Central apnea 03/26/2017   Acute on chronic diastolic heart failure ([place]) 03/29/2017   Abdominal swelling 03/29/2017   Sleep apnea treated with nocturnal BiPAP 04/23/2018   Type 2 diabetes mellitus without complication, without long-term current use of insulin ([place]) 08/27/2018   Acute lower UTI 08/04/2019   Constipation    Acute hypotension 08/22/2019   HCAP (healthcare-associated pneumonia) 10/09/2019   SOB (shortness of breath) 10/10/2019   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[place]) 12/01/2019   Cushing's syndrome, ACTH dependent ([place]) 09/13/2020   Past Medical History:  Diagnosis Date   Anxiety    Arthritis    Asthma    Back pain    Cancer ([place])  CHF (congestive heart failure) (HCC)    Chronic pain    Depression    DVT (deep venous thrombosis) (HCC) 1983   Dysrhythmia    Edema    GERD (gastroesophageal reflux disease)    Hypertension    Hypotension    Joint pain    Osteoarthritis    PAF (paroxysmal atrial fibrillation) ([place])    Pneumonia 11/2011   Sleep apnea    SOCIAL HX:  Social History   Tobacco Use   Smoking status: Former    Packs/day: 0.50    Years: 7.00    Additional pack years: 0.00    Total pack years: 3.50    Types: Cigarettes    Quit date: 08/21/1976    Years since quitting: 46.2   Smokeless tobacco: Never   Substance Use Topics   Alcohol use: Not Currently   FAMILY HX:  Family History  Problem Relation Age of Onset   Depression Mother    Sudden death Mother    Anxiety disorder Mother    Coronary artery disease Father        at age 40   Hyperlipidemia Father    Thyroid disease Father    Coronary artery disease Maternal Grandmother    Depression Maternal Grandmother    Cancer Paternal Grandmother        breast   Alcoholism Son    Sleep apnea Neg Hx    Multiple sclerosis Neg Hx        Preferred Pharmacy: [facility]:  Allergies  Allergen Reactions   Other Rash    Topical Surgical prep  - severe rash   Chlorhexidine Hives and Other (See Comments)    And, sores appear where applied   Hydrocodone Nausea And Vomiting and Other (See Comments)    Hydrocodone causes vomiting    Oxycodone Nausea And Vomiting and Other (See Comments)    Oral oxycodone causes vomiting   Sertraline Hives, Swelling, Rash and Other (See Comments)    [DERM][OTHER]RASH WITH SWELLING   Macrobid [Nitrofurantoin]     Other reaction(s): 3.9.17 nausea and vomitting,  NOT DOCUMENTED ON THE MAR   Phenol Hives    NOT DOCUMENTED ON THE MAR   Rofecoxib     Other reaction(s): Unknown  NOT DOCUMENTED ON THE MAR     PERTINENT MEDICATIONS:  Outpatient Encounter Medications as of 11/16/2022  Medication Sig   acetaminophen (TYLENOL) 500 MG tablet Take 1,000 mg by mouth every 6 (six) hours as needed (fever/headache/pain).   ALPRAZolam (XANAX) 0.5 MG tablet Take 1 tablet (0.5 mg total) by mouth at bedtime. May also take 1 tablet every 12 hours as needed anxiety   ARTIFICIAL TEARS 0.2-0.2-1 % SOLN Place 2 drops into both eyes daily as needed (for dry eyes).   baclofen (LIORESAL) 20 MG tablet Take 20 mg by mouth daily as needed for muscle spasms.   Biotin 10000 MCG TABS Take 10,000 mcg by mouth daily after lunch.   bisacodyl (DULCOLAX) 10 MG suppository Place 10 mg rectally daily as needed for moderate constipation.    buPROPion (WELLBUTRIN XL) 150 MG 24 hr tablet Take 1 tablet (150 mg total) by mouth daily. (Patient taking differently: Take 150 mg by mouth daily. After breakfast to after lunch)   Calcium Carbonate-Vitamin D3 600-400 MG-UNIT TABS Take 1 tablet by mouth in the morning and at bedtime. (After breakfast & after lunch)   cetirizine (ZYRTEC) 10 MG tablet Take 10 mg by mouth daily as needed for allergies.  Cholecalciferol (VITAMIN D3) 50 MCG (2000 UT) TABS Take 2,000 Units by mouth in the morning and at bedtime. (After breakfast & after lunch)   Coenzyme Q10 (COQ10 PO) Take 100 mg by mouth daily in the afternoon.   corticotropin (ACTHAR) 80 UNIT/ML injectable gel Inject 1 mL (80 Units total) into the skin every other day for 10 days. May give additional doses PRN. (Patient taking differently: Inject 80 Units into the skin as needed (for a boost fives times a month). Husband will bring in medication administer it in front of the nurse)   D-Mannose 500 MG CAPS Take 500 mg by mouth every morning.   Dermatological Products, Misc. [facility] EX) Apply 1 Application topically every morning. Apply to affected areas of groin and inguinal creases   diclofenac Sodium (VOLTAREN) 1 % GEL Apply 2 g topically 3 (three) times daily as needed. (Patient taking differently: Apply 2 g topically 3 (three) times daily as needed (pain (applied to bilateral shoulders)).)   diphenhydrAMINE (BENADRYL) 25 mg capsule Take 25 mg by mouth as needed for itching (number of times per day unspecified on MAR).   ELIQUIS 5 MG TABS tablet Take 5 mg by mouth 2 (two) times daily.   Emollient (CERAVE MOISTURIZING) CREA Apply 1 Application topically in the morning.   escitalopram (LEXAPRO) 10 MG tablet Take 1 tablet (10 mg total) by mouth daily. (Patient taking differently: Take 10 mg by mouth at bedtime.)   famotidine (PEPCID) 20 MG tablet Take 20 mg by mouth at bedtime.   gabapentin (NEURONTIN) 300 MG capsule Mrs. Mccormac will take 300 mg of  gabapentin at 6 AM, 10 AM, 2 PM and 6 PM and 600 mg at 10 PM by mouth. (Patient taking differently: Take 300 mg by mouth See admin instructions. Give 1 capsule by mouth at 0600, 1000, 1800, and 2200)   lactulose (CHRONULAC) 10 GM/15ML solution Take 15 mLs (10 g total) by mouth 2 (two) times daily as needed for mild constipation. (Patient taking differently: Take 10 g by mouth 2 (two) times daily.)   levothyroxine (SYNTHROID, LEVOTHROID) 75 MCG tablet Take 75 mcg by mouth daily before breakfast. (0500)   metolazone (ZAROXOLYN) 2.5 MG tablet Take 2.5 mg by mouth daily as needed (for weight gain greater than 3 lbs in 2 days with swelling).   metoprolol succinate (TOPROL-XL) 25 MG 24 hr tablet Take 0.5 tablets (12.5 mg total) by mouth daily. Take with or immediately following a meal.   midodrine (PROAMATINE) 2.5 MG tablet Take 3 tablets (7.5 mg total) by mouth 3 (three) times daily with meals. (Patient taking differently: Take 7.5 mg by mouth 3 (three) times daily as needed (with meals for sbp <100).)   Multiple Vitamin (MULTIVITAMIN WITH MINERALS) TABS tablet Take 1 tablet by mouth daily after lunch.   NON FORMULARY ASV breathing machine is to be used while napping & at bedtime. Fill canister up to max line with distilled water and turn concentrator to 5LPM.   omeprazole (PRILOSEC) 40 MG capsule Take 1 capsule (40 mg total) by mouth every morning.   ondansetron (ZOFRAN-ODT) 4 MG disintegrating tablet Take 4 mg by mouth in the morning, at noon, in the evening, and at bedtime.   oxybutynin (DITROPAN) 5 MG tablet Take 5 mg by mouth daily as needed for bladder spasms (overactive bladder).   phenazopyridine (PYRIDIUM) 100 MG tablet Take 100 mg by mouth 3 (three) times daily as needed for pain.   phenylephrine-shark liver oil-mineral oil-petrolatum (PREPARATION H) 0.25-14-74.9 %  rectal ointment Place 1 Application rectally every 6 (six) hours as needed for hemorrhoids.   potassium chloride SA (KLOR-CON) 20 MEQ  tablet Take 2 tablets (40 mEq total) by mouth daily. (Patient taking differently: Take 20 mEq by mouth 2 (two) times daily. (After lunch & after dinner))   Prucalopride Succinate (MOTEGRITY) 2 MG TABS Take 1 tablet by mouth every morning.   psyllium (METAMUCIL SMOOTH TEXTURE) 28 % packet Take 1 packet by mouth 2 (two) times daily.   senna-docusate (SENOKOT-S) 8.6-50 MG tablet Take 2 tablets by mouth at bedtime. (Patient taking differently: Take 2 tablets by mouth 2 (two) times daily.)   simethicone (MYLICON) 80 MG chewable tablet Chew 160 mg by mouth 3 (three) times daily as needed for flatulence (indigestion).   simvastatin (ZOCOR) 10 MG tablet Take 10 mg by mouth at bedtime.   sodium phosphate (FLEET) 7-19 GM/118ML ENEM Place 1 enema rectally daily as needed for severe constipation.   SYRINGE/NEEDLE, DISP, 1 ML (BD LUER-LOK SYRINGE) 25G X 5/8" 1 ML MISC Use as directed to inject  Acthar   torsemide (DEMADEX) 20 MG tablet Take 2 tablets (40 mg total) by mouth daily. (Patient taking differently: Take 20 mg by mouth as directed. Take 1.5 tabs (30 mg) in am and 1 tab (20 MG) in evening)   zinc oxide 20 % ointment Apply 1 application topically as needed for irritation (apply labia every shift as needed for redness).   No facility-administered encounter medications on file as of 11/16/2022.    History obtained from review of EMR, discussion with daughter/caregiver,  facility staff/caregiver and/or patient.     I reviewed available labs, medications, imaging, studies and related documents from the EMR.  There were no new records/imaging since last visit.   Physical Exam: GENERAL: NAD LUNGS: CTAB except with faint/fine crackles in LLL, no increased work of breathing, room air CARDIAC:  S1S2, IRIR with LUSB murmur, RLE 2+ edema/LLE 1+ edema, no palpable cord or erythema in either leg, wearing TEDs, No cyanosis ABD:  Normo-active BS x 4 quads, soft, non-tender EXTREMITIES: Strength equal in BLE and  BUE, weaker in BLE NEURO:  Noted generalized weakness, no cognitive impairment PSYCH:  non-anxious affect, A & O x 3  Thank you for the opportunity to participate in the care of Tsaile "[name]" [place]. Please call our main office at [phone] if we can be of additional assistance.    Toni Hippo FNP-C  [email or web address]  Phone:  [phone]

## 2022-11-19 ENCOUNTER — Encounter: Payer: Self-pay | Admitting: Family Medicine

## 2022-11-20 ENCOUNTER — Encounter (HOSPITAL_COMMUNITY): Payer: Self-pay

## 2022-11-20 ENCOUNTER — Emergency Department (HOSPITAL_COMMUNITY): Payer: Medicare Other

## 2022-11-20 ENCOUNTER — Inpatient Hospital Stay (HOSPITAL_COMMUNITY)
Admission: EM | Admit: 2022-11-20 | Discharge: 2022-11-24 | DRG: 872 | Disposition: A | Discharge status: Skilled Nursing Facility | Payer: Medicare Other | Source: Skilled Nursing Facility | Attending: Family Medicine | Admitting: Family Medicine

## 2022-11-20 ENCOUNTER — Other Ambulatory Visit: Payer: Self-pay

## 2022-11-20 DIAGNOSIS — N390 Urinary tract infection, site not specified: Secondary | ICD-10-CM

## 2022-11-20 DIAGNOSIS — I959 Hypotension, unspecified: Secondary | ICD-10-CM | POA: Diagnosis present

## 2022-11-20 DIAGNOSIS — F418 Other specified anxiety disorders: Secondary | ICD-10-CM

## 2022-11-20 DIAGNOSIS — Z86718 Personal history of other venous thrombosis and embolism: Secondary | ICD-10-CM

## 2022-11-20 DIAGNOSIS — I110 Hypertensive heart disease with heart failure: Secondary | ICD-10-CM | POA: Diagnosis present

## 2022-11-20 DIAGNOSIS — E860 Dehydration: Secondary | ICD-10-CM | POA: Diagnosis present

## 2022-11-20 DIAGNOSIS — E662 Morbid (severe) obesity with alveolar hypoventilation: Secondary | ICD-10-CM | POA: Diagnosis present

## 2022-11-20 DIAGNOSIS — Z8249 Family history of ischemic heart disease and other diseases of the circulatory system: Secondary | ICD-10-CM

## 2022-11-20 DIAGNOSIS — Z83438 Family history of other disorder of lipoprotein metabolism and other lipidemia: Secondary | ICD-10-CM

## 2022-11-20 DIAGNOSIS — F419 Anxiety disorder, unspecified: Secondary | ICD-10-CM | POA: Diagnosis present

## 2022-11-20 DIAGNOSIS — Z9841 Cataract extraction status, right eye: Secondary | ICD-10-CM

## 2022-11-20 DIAGNOSIS — Z79899 Other long term (current) drug therapy: Secondary | ICD-10-CM

## 2022-11-20 DIAGNOSIS — I4821 Permanent atrial fibrillation: Secondary | ICD-10-CM

## 2022-11-20 DIAGNOSIS — F32A Depression, unspecified: Secondary | ICD-10-CM | POA: Diagnosis present

## 2022-11-20 DIAGNOSIS — A419 Sepsis, unspecified organism: Secondary | ICD-10-CM | POA: Diagnosis not present

## 2022-11-20 DIAGNOSIS — G35 Multiple sclerosis: Secondary | ICD-10-CM | POA: Diagnosis not present

## 2022-11-20 DIAGNOSIS — Z6837 Body mass index (BMI) 37.0-37.9, adult: Secondary | ICD-10-CM

## 2022-11-20 DIAGNOSIS — Z6835 Body mass index (BMI) 35.0-35.9, adult: Secondary | ICD-10-CM

## 2022-11-20 DIAGNOSIS — E871 Hypo-osmolality and hyponatremia: Secondary | ICD-10-CM | POA: Diagnosis present

## 2022-11-20 DIAGNOSIS — N2889 Other specified disorders of kidney and ureter: Secondary | ICD-10-CM | POA: Diagnosis present

## 2022-11-20 DIAGNOSIS — N318 Other neuromuscular dysfunction of bladder: Secondary | ICD-10-CM | POA: Diagnosis present

## 2022-11-20 DIAGNOSIS — Z975 Presence of (intrauterine) contraceptive device: Secondary | ICD-10-CM

## 2022-11-20 DIAGNOSIS — G8929 Other chronic pain: Secondary | ICD-10-CM | POA: Diagnosis present

## 2022-11-20 DIAGNOSIS — N136 Pyonephrosis: Secondary | ICD-10-CM | POA: Diagnosis present

## 2022-11-20 DIAGNOSIS — Z87891 Personal history of nicotine dependence: Secondary | ICD-10-CM

## 2022-11-20 DIAGNOSIS — I5042 Chronic combined systolic (congestive) and diastolic (congestive) heart failure: Secondary | ICD-10-CM | POA: Diagnosis present

## 2022-11-20 DIAGNOSIS — E039 Hypothyroidism, unspecified: Secondary | ICD-10-CM

## 2022-11-20 DIAGNOSIS — I5022 Chronic systolic (congestive) heart failure: Secondary | ICD-10-CM | POA: Diagnosis present

## 2022-11-20 DIAGNOSIS — Z8349 Family history of other endocrine, nutritional and metabolic diseases: Secondary | ICD-10-CM

## 2022-11-20 DIAGNOSIS — Z1612 Extended spectrum beta lactamase (ESBL) resistance: Secondary | ICD-10-CM | POA: Diagnosis present

## 2022-11-20 DIAGNOSIS — Z1152 Encounter for screening for COVID-19: Secondary | ICD-10-CM

## 2022-11-20 DIAGNOSIS — G4733 Obstructive sleep apnea (adult) (pediatric): Secondary | ICD-10-CM

## 2022-11-20 DIAGNOSIS — G4730 Sleep apnea, unspecified: Secondary | ICD-10-CM | POA: Diagnosis present

## 2022-11-20 DIAGNOSIS — Z981 Arthrodesis status: Secondary | ICD-10-CM

## 2022-11-20 DIAGNOSIS — Z85528 Personal history of other malignant neoplasm of kidney: Secondary | ICD-10-CM

## 2022-11-20 DIAGNOSIS — Z87440 Personal history of urinary (tract) infections: Secondary | ICD-10-CM

## 2022-11-20 DIAGNOSIS — L89151 Pressure ulcer of sacral region, stage 1: Secondary | ICD-10-CM | POA: Diagnosis present

## 2022-11-20 DIAGNOSIS — E249 Cushing's syndrome, unspecified: Secondary | ICD-10-CM | POA: Diagnosis present

## 2022-11-20 DIAGNOSIS — Z7901 Long term (current) use of anticoagulants: Secondary | ICD-10-CM

## 2022-11-20 DIAGNOSIS — N319 Neuromuscular dysfunction of bladder, unspecified: Secondary | ICD-10-CM | POA: Diagnosis not present

## 2022-11-20 DIAGNOSIS — Z8616 Personal history of COVID-19: Secondary | ICD-10-CM

## 2022-11-20 DIAGNOSIS — Z9842 Cataract extraction status, left eye: Secondary | ICD-10-CM

## 2022-11-20 DIAGNOSIS — E785 Hyperlipidemia, unspecified: Secondary | ICD-10-CM

## 2022-11-20 DIAGNOSIS — Z885 Allergy status to narcotic agent status: Secondary | ICD-10-CM

## 2022-11-20 DIAGNOSIS — R0689 Other abnormalities of breathing: Secondary | ICD-10-CM | POA: Diagnosis present

## 2022-11-20 DIAGNOSIS — I878 Other specified disorders of veins: Secondary | ICD-10-CM | POA: Diagnosis present

## 2022-11-20 DIAGNOSIS — Z79890 Hormone replacement therapy (postmenopausal): Secondary | ICD-10-CM

## 2022-11-20 DIAGNOSIS — I5032 Chronic diastolic (congestive) heart failure: Secondary | ICD-10-CM

## 2022-11-20 DIAGNOSIS — R9431 Abnormal electrocardiogram [ECG] [EKG]: Secondary | ICD-10-CM | POA: Diagnosis present

## 2022-11-20 DIAGNOSIS — Z818 Family history of other mental and behavioral disorders: Secondary | ICD-10-CM

## 2022-11-20 DIAGNOSIS — Z888 Allergy status to other drugs, medicaments and biological substances status: Secondary | ICD-10-CM

## 2022-11-20 DIAGNOSIS — E876 Hypokalemia: Secondary | ICD-10-CM | POA: Diagnosis present

## 2022-11-20 DIAGNOSIS — L8990 Pressure ulcer of unspecified site, unspecified stage: Secondary | ICD-10-CM | POA: Insufficient documentation

## 2022-11-20 DIAGNOSIS — R531 Weakness: Secondary | ICD-10-CM

## 2022-11-20 DIAGNOSIS — E240 Pituitary-dependent Cushing's disease: Secondary | ICD-10-CM

## 2022-11-20 HISTORY — DX: Sepsis, unspecified organism: A41.9

## 2022-11-20 LAB — URINALYSIS, ROUTINE W REFLEX MICROSCOPIC
Bilirubin Urine: NEGATIVE
Glucose, UA: NEGATIVE mg/dL
Ketones, ur: NEGATIVE mg/dL
Nitrite: NEGATIVE
Protein, ur: NEGATIVE mg/dL
Specific Gravity, Urine: 1.004 — ABNORMAL LOW (ref 1.005–1.030)
WBC, UA: >50 WBC/hpf (ref 0–5)
pH: 6 (ref 5.0–8.0)

## 2022-11-20 LAB — BASIC METABOLIC PANEL
Anion gap: 10 (ref 5–15)
Anion gap: 11 (ref 5–15)
BUN: 17 mg/dL (ref 8–23)
BUN: 18 mg/dL (ref 8–23)
CO2: 35 mmol/L — ABNORMAL HIGH (ref 22–32)
CO2: 34 mmol/L — ABNORMAL HIGH (ref 22–32)
Calcium: 8.2 mg/dL — ABNORMAL LOW (ref 8.9–10.3)
Calcium: 8.2 mg/dL — ABNORMAL LOW (ref 8.9–10.3)
Chloride: 83 mmol/L — ABNORMAL LOW (ref 98–111)
Chloride: 83 mmol/L — ABNORMAL LOW (ref 98–111)
Creatinine, Ser: 0.83 mg/dL (ref 0.44–1.00)
Creatinine, Ser: 0.94 mg/dL (ref 0.44–1.00)
GFR, Estimated: >60 mL/min (ref >60)
GFR, Estimated: >60 mL/min (ref >60)
Glucose, Bld: 90 mg/dL (ref 70–99)
Glucose, Bld: 149 mg/dL — ABNORMAL HIGH (ref 70–99)
Potassium: 3.2 mmol/L — ABNORMAL LOW (ref 3.5–5.1)
Potassium: 3.4 mmol/L — ABNORMAL LOW (ref 3.5–5.1)
Sodium: 128 mmol/L — ABNORMAL LOW (ref 135–145)
Sodium: 128 mmol/L — ABNORMAL LOW (ref 135–145)

## 2022-11-20 LAB — CBC
HCT: 34.7 % — ABNORMAL LOW (ref 36.0–46.0)
Hemoglobin: 11.8 g/dL — ABNORMAL LOW (ref 12.0–15.0)
MCH: 30.9 pg (ref 26.0–34.0)
MCHC: 34 g/dL (ref 30.0–36.0)
MCV: 90.8 fL (ref 80.0–100.0)
Platelets: 162 10*3/uL (ref 150–400)
RBC: 3.82 MIL/uL — ABNORMAL LOW (ref 3.87–5.11)
RDW: 13.1 % (ref 11.5–15.5)
WBC: 13.3 10*3/uL — ABNORMAL HIGH (ref 4.0–10.5)
nRBC: 0 % (ref 0.0–0.2)

## 2022-11-20 LAB — COMPREHENSIVE METABOLIC PANEL
ALT: 8 U/L (ref 0–44)
AST: 16 U/L (ref 15–41)
Albumin: 2.8 g/dL — ABNORMAL LOW (ref 3.5–5.0)
Alkaline Phosphatase: 60 U/L (ref 38–126)
Anion gap: 10 (ref 5–15)
BUN: 23 mg/dL (ref 8–23)
CO2: 34 mmol/L — ABNORMAL HIGH (ref 22–32)
Calcium: 7.9 mg/dL — ABNORMAL LOW (ref 8.9–10.3)
Chloride: 78 mmol/L — ABNORMAL LOW (ref 98–111)
Creatinine, Ser: 0.99 mg/dL (ref 0.44–1.00)
GFR, Estimated: 59 mL/min — ABNORMAL LOW (ref >60)
Glucose, Bld: 113 mg/dL — ABNORMAL HIGH (ref 70–99)
Potassium: 3.2 mmol/L — ABNORMAL LOW (ref 3.5–5.1)
Sodium: 122 mmol/L — ABNORMAL LOW (ref 135–145)
Total Bilirubin: 1 mg/dL (ref 0.3–1.2)
Total Protein: 5.5 g/dL — ABNORMAL LOW (ref 6.5–8.1)

## 2022-11-20 LAB — RESP PANEL BY RT-PCR (RSV, FLU A&B, COVID)  RVPGX2
Influenza A by PCR: NEGATIVE
Influenza B by PCR: NEGATIVE
Resp Syncytial Virus by PCR: NEGATIVE
SARS Coronavirus 2 by RT PCR: NEGATIVE

## 2022-11-20 LAB — MAGNESIUM: Magnesium: 1.9 mg/dL (ref 1.7–2.4)

## 2022-11-20 LAB — OSMOLALITY: Osmolality: 280 mOsm/kg (ref 275–295)

## 2022-11-20 LAB — LACTIC ACID, PLASMA: Lactic Acid, Venous: 1.1 mmol/L (ref 0.5–1.9)

## 2022-11-20 MED ORDER — DIPHENHYDRAMINE HCL 25 MG PO CAPS: 25.0000 mg | ORAL_CAPSULE | Freq: Three times a day (TID) | ORAL | Status: DC | PRN

## 2022-11-20 MED ORDER — CORTICOTROPIN 80 UNIT/ML IJ GEL: 80.0000 [IU] | INTRAMUSCULAR | Status: DC

## 2022-11-20 MED ORDER — APIXABAN 5 MG PO TABS
5.0000 mg | ORAL_TABLET | Freq: Two times a day (BID) | ORAL | Status: DC
  Administered 2022-11-20 – 2022-11-24 (×8): 5 mg via ORAL
  Filled 2022-11-20 (×8): qty 1

## 2022-11-20 MED ORDER — DICLOFENAC SODIUM 1 % EX GEL: 2.0000 g | Freq: Three times a day (TID) | CUTANEOUS | Status: DC | PRN

## 2022-11-20 MED ORDER — SODIUM CHLORIDE 0.9 % IV SOLN
1.0000 g | Freq: Once | INTRAVENOUS | Status: AC
  Administered 2022-11-20: 1 g via INTRAVENOUS
  Filled 2022-11-20: qty 10

## 2022-11-20 MED ORDER — LACTATED RINGERS IV BOLUS
1000.0000 mL | Freq: Once | INTRAVENOUS | Status: AC
  Administered 2022-11-20: 1000 mL via INTRAVENOUS

## 2022-11-20 MED ORDER — GABAPENTIN 300 MG PO CAPS
300.0000 mg | ORAL_CAPSULE | Freq: Every day | ORAL | Status: DC
  Administered 2022-11-20 – 2022-11-24 (×21): 300 mg via ORAL
  Filled 2022-11-20 (×20): qty 1

## 2022-11-20 MED ORDER — POLYVINYL ALCOHOL 1.4 % OP SOLN
1.0000 [drp] | Freq: Two times a day (BID) | OPHTHALMIC | Status: DC
  Administered 2022-11-20 – 2022-11-24 (×7): 1 [drp] via OPHTHALMIC
  Filled 2022-11-20: qty 15

## 2022-11-20 MED ORDER — SODIUM CHLORIDE 0.9% FLUSH
3.0000 mL | Freq: Two times a day (BID) | INTRAVENOUS | Status: DC
  Administered 2022-11-20 – 2022-11-24 (×6): 3 mL via INTRAVENOUS

## 2022-11-20 MED ORDER — ACETAMINOPHEN 650 MG RE SUPP: 650.0000 mg | Freq: Four times a day (QID) | RECTAL | Status: DC | PRN

## 2022-11-20 MED ORDER — PANTOPRAZOLE SODIUM 40 MG PO TBEC
40.0000 mg | DELAYED_RELEASE_TABLET | Freq: Every day | ORAL | Status: DC
  Administered 2022-11-21 – 2022-11-24 (×4): 40 mg via ORAL
  Filled 2022-11-20 (×4): qty 1

## 2022-11-20 MED ORDER — ALPRAZOLAM 0.5 MG PO TABS
0.5000 mg | ORAL_TABLET | Freq: Every day | ORAL | Status: DC
  Administered 2022-11-20 – 2022-11-23 (×4): 0.5 mg via ORAL
  Filled 2022-11-20 (×4): qty 1

## 2022-11-20 MED ORDER — BACLOFEN 20 MG PO TABS: 20.0000 mg | ORAL_TABLET | Freq: Every day | ORAL | Status: DC | PRN

## 2022-11-20 MED ORDER — SODIUM CHLORIDE 0.9 % IV BOLUS
1000.0000 mL | Freq: Once | INTRAVENOUS | Status: AC
  Administered 2022-11-20: 1000 mL via INTRAVENOUS

## 2022-11-20 MED ORDER — ACETAMINOPHEN 325 MG PO TABS
650.0000 mg | ORAL_TABLET | Freq: Four times a day (QID) | ORAL | Status: DC | PRN
  Administered 2022-11-24: 650 mg via ORAL
  Filled 2022-11-20: qty 2

## 2022-11-20 MED ORDER — POTASSIUM CHLORIDE CRYS ER 20 MEQ PO TBCR
40.0000 meq | EXTENDED_RELEASE_TABLET | Freq: Once | ORAL | Status: AC
  Administered 2022-11-20: 40 meq via ORAL
  Filled 2022-11-20: qty 2

## 2022-11-20 MED ORDER — METOPROLOL SUCCINATE ER 25 MG PO TB24
12.5000 mg | ORAL_TABLET | Freq: Every day | ORAL | Status: DC
  Administered 2022-11-21 – 2022-11-23 (×3): 12.5 mg via ORAL
  Filled 2022-11-20 (×3): qty 1

## 2022-11-20 MED ORDER — BUPROPION HCL ER (XL) 150 MG PO TB24
150.0000 mg | ORAL_TABLET | Freq: Every day | ORAL | Status: DC
  Administered 2022-11-21 – 2022-11-24 (×4): 150 mg via ORAL
  Filled 2022-11-20 (×4): qty 1

## 2022-11-20 MED ORDER — ENOXAPARIN SODIUM 40 MG/0.4ML IJ SOSY: 40.0000 mg | PREFILLED_SYRINGE | INTRAMUSCULAR | Status: DC

## 2022-11-20 MED ORDER — MELATONIN 5 MG PO TABS
5.0000 mg | ORAL_TABLET | Freq: Every day | ORAL | Status: DC
  Administered 2022-11-20 – 2022-11-23 (×4): 5 mg via ORAL
  Filled 2022-11-20 (×5): qty 1

## 2022-11-20 MED ORDER — LEVOTHYROXINE SODIUM 75 MCG PO TABS
75.0000 ug | ORAL_TABLET | Freq: Every day | ORAL | Status: DC
  Administered 2022-11-21 – 2022-11-24 (×4): 75 ug via ORAL
  Filled 2022-11-20 (×4): qty 1

## 2022-11-20 MED ORDER — SODIUM CHLORIDE 0.9 % IV SOLN
1.0000 g | INTRAVENOUS | Status: DC
  Administered 2022-11-21: 1 g via INTRAVENOUS
  Filled 2022-11-20 (×2): qty 10

## 2022-11-20 MED ORDER — ESCITALOPRAM OXALATE 10 MG PO TABS
10.0000 mg | ORAL_TABLET | Freq: Every day | ORAL | Status: DC
  Administered 2022-11-20 – 2022-11-23 (×4): 10 mg via ORAL
  Filled 2022-11-20 (×4): qty 1

## 2022-11-20 MED ORDER — POLYETHYLENE GLYCOL 3350 17 G PO PACK
17.0000 g | PACK | Freq: Every day | ORAL | Status: DC | PRN
  Filled 2022-11-20: qty 1

## 2022-11-20 NOTE — ED Provider Notes (Signed)
[facility] Provider Note   CSN: [id number] Arrival date & time: 11/20/22  1131     History  Chief Complaint  Patient presents with   Code Sepsis    Toni [name] is a 77 y.o. female.  HPI     Home Medications Prior to Admission medications   Medication Sig Start Date End Date Taking? Authorizing Provider  acetaminophen (TYLENOL) 500 MG tablet Take 1,000 mg by mouth every 8 (eight) hours as needed (fever/headache/pain).    [provider]  ALPRAZolam Duanne Moron) 0.5 MG tablet Take 1 tablet (0.5 mg total) by mouth at bedtime. May also take 1 tablet every 12 hours as needed anxiety 07/17/22   Ghimire, Henreitta Leber, MD  ARTIFICIAL TEARS 0.2-0.2-1 % SOLN Place 2 drops into both eyes daily as needed (for dry eyes). 06/09/21   [provider]  baclofen (LIORESAL) 20 MG tablet Take 20 mg by mouth daily as needed for muscle spasms.    [provider]  Biotin 10000 MCG TABS Take 10,000 mcg by mouth daily after lunch.    [provider]  bisacodyl (DULCOLAX) 10 MG suppository Place 10 mg rectally daily as needed for moderate constipation.    [provider]  buPROPion (WELLBUTRIN XL) 150 MG 24 hr tablet Take 1 tablet (150 mg total) by mouth daily. Patient taking differently: Take 150 mg by mouth daily. After breakfast to after lunch 10/18/20   Dohmeier, Asencion Partridge, MD  cetirizine (ZYRTEC) 10 MG tablet Take 10 mg by mouth daily as needed for allergies.     [provider]  Cholecalciferol (VITAMIN D3) 50 MCG (2000 UT) TABS Take 2,000 Units by mouth in the morning and at bedtime. (After breakfast & after lunch)    [provider]  clotrimazole (LOTRIMIN) 1 % external solution Apply 1 Application topically 2 (two) times daily.    [provider]  corticotropin (ACTHAR) 80 UNIT/ML injectable gel Inject 1 mL (80 Units total) into the skin every other day for 10 days. May give additional  doses PRN. Patient taking differently: Inject 80 Units into the skin as needed (for a boost fives times a month). Husband will bring in medication administer it in front of the nurse 05/16/22 07/15/23  Dohmeier, Asencion Partridge, MD  D-Mannose 500 MG CAPS Take 500 mg by mouth every morning.    [provider]  Dermatological Products, Misc. [facility] EX) Apply 1 Application topically every morning. Apply to affected areas of groin and inguinal creases    [provider]  diclofenac Sodium (VOLTAREN) 1 % GEL Apply 2 g topically 3 (three) times daily as needed. Patient taking differently: Apply 2 g topically 3 (three) times daily as needed (pain (applied to bilateral shoulders)). 12/08/20   Mcarthur Rossetti, MD  diphenhydrAMINE (BENADRYL) 25 mg capsule Take 25 mg by mouth every 8 (eight) hours as needed for itching (number of times per day unspecified on MAR).    [provider]  ELIQUIS 5 MG TABS tablet Take 5 mg by mouth 2 (two) times daily. 05/23/22   [provider]  Emollient (CERAVE MOISTURIZING) CREA Apply 1 Application topically in the morning.    [provider]  escitalopram (LEXAPRO) 10 MG tablet Take 1 tablet (10 mg total) by mouth daily. Patient taking differently: Take 10 mg by mouth at bedtime. 10/18/20   Dohmeier, Asencion Partridge, MD  famotidine (PEPCID) 20 MG tablet Take 20 mg by mouth at bedtime.  [provider]  gabapentin (NEURONTIN) 300 MG capsule Mrs. Misiaszek will take 300 mg of gabapentin at 6 AM, 10 AM, 2 PM and 6 PM and 600 mg at 10 PM by mouth. Patient taking differently: Take 300 mg by mouth See admin instructions. Give 1 capsule by mouth at 0600, 1000, 1800, and 2200 03/07/21   Dohmeier, Asencion Partridge, MD  guaiFENesin (ROBITUSSIN) 100 MG/5ML liquid Take 5 mLs by mouth every 6 (six) hours as needed for cough or to loosen phlegm.    [provider]  lactulose (CHRONULAC) 10 GM/15ML solution Take 15 mLs (10 g total) by mouth 2 (two) times  daily as needed for mild constipation. Patient taking differently: Take 10 g by mouth 2 (two) times daily. 07/17/22   Ghimire, Henreitta Leber, MD  levothyroxine (SYNTHROID, LEVOTHROID) 75 MCG tablet Take 75 mcg by mouth daily before breakfast. (0500)    [provider]  melatonin 5 MG TABS Take 5 mg by mouth at bedtime as needed.    [provider]  metolazone (ZAROXOLYN) 2.5 MG tablet Take 2.5 mg by mouth daily as needed (for weight gain greater than 3 lbs in 2 days with swelling). 09/09/21   [provider]  metoprolol succinate (TOPROL-XL) 25 MG 24 hr tablet Take 0.5 tablets (12.5 mg total) by mouth daily. Take with or immediately following a meal. 07/17/22 07/12/23  Ghimire, Henreitta Leber, MD  midodrine (PROAMATINE) 2.5 MG tablet Take 3 tablets (7.5 mg total) by mouth 3 (three) times daily with meals. Patient taking differently: Take 7.5 mg by mouth 3 (three) times daily as needed (with meals for sbp <100). 07/17/22   Ghimire, Henreitta Leber, MD  Multiple Vitamin (MULTIVITAMIN WITH MINERALS) TABS tablet Take 1 tablet by mouth daily after lunch.    [provider]  NON FORMULARY ASV breathing machine is to be used while napping & at bedtime. Fill canister up to max line with distilled water and turn concentrator to 5LPM.    [provider]  omeprazole (PRILOSEC) 40 MG capsule Take 1 capsule (40 mg total) by mouth every morning. 11/16/21   Levin Erp, PA  ondansetron (ZOFRAN-ODT) 4 MG disintegrating tablet Take 4 mg by mouth in the morning, at noon, in the evening, and at bedtime. 06/01/22   [provider]  oxybutynin (DITROPAN) 5 MG tablet Take 5 mg by mouth daily as needed for bladder spasms (overactive bladder). 02/09/20   [provider]  phenazopyridine (PYRIDIUM) 100 MG tablet Take 100 mg by mouth 3 (three) times daily as needed for pain. 08/15/21   [provider]  phenylephrine-shark liver oil-mineral oil-petrolatum  (PREPARATION H) 0.25-14-74.9 % rectal ointment Place 1 Application rectally every 6 (six) hours as needed for hemorrhoids. 05/19/22   [provider]  potassium chloride SA (KLOR-CON) 20 MEQ tablet Take 2 tablets (40 mEq total) by mouth daily. Patient taking differently: Take 20 mEq by mouth 2 (two) times daily. (After lunch & after dinner) 01/03/21   Hilty, Nadean Corwin, MD  Prucalopride Succinate (MOTEGRITY) 2 MG TABS Take 1 tablet by mouth every morning.    [provider]  psyllium (METAMUCIL SMOOTH TEXTURE) 28 % packet Take 1 packet by mouth 2 (two) times daily.    [provider]  senna-docusate (SENOKOT-S) 8.6-50 MG tablet Take 2 tablets by mouth at bedtime. Patient taking differently: Take 2 tablets by mouth 2 (two) times daily. 07/17/22   Ghimire, Henreitta Leber, MD  simethicone (MYLICON) 80 MG chewable tablet Sarina Ser  160 mg by mouth 3 (three) times daily as needed for flatulence (indigestion).    [provider]  sodium phosphate (FLEET) 7-19 GM/118ML ENEM Place 1 enema rectally daily as needed for severe constipation.    [provider]  torsemide (DEMADEX) 20 MG tablet Take 2 tablets (40 mg total) by mouth daily. Patient taking differently: Take 20 mg by mouth as directed. Take 1.5 tabs (30 mg) in am and 1 tab (20 MG) in evening 07/17/22   Ghimire, Henreitta Leber, MD  zinc oxide 20 % ointment Apply 1 application topically as needed for irritation (apply labia every shift as needed for redness).    [provider]      Allergies    Other, Chlorhexidine, Hydrocodone, Oxycodone, Sertraline, Macrobid [nitrofurantoin], Phenol, and Rofecoxib    Review of Systems   Review of Systems  Physical Exam Updated Vital Signs BP (!) 103/50   Pulse 73   Temp 98.9 F (37.2 C) (Oral)   Resp 11   SpO2 100%  Physical Exam  ED Results / Procedures / Treatments   Labs (all labs ordered are listed, but only abnormal results are displayed) Results for orders  placed or performed during the hospital encounter of 11/20/22  Resp panel by RT-PCR (RSV, Flu A&B, Covid) Anterior Nasal Swab   Specimen: Anterior Nasal Swab  Result Value Ref Range   SARS Coronavirus 2 by RT PCR NEGATIVE NEGATIVE   Influenza A by PCR NEGATIVE NEGATIVE   Influenza B by PCR NEGATIVE NEGATIVE   Resp Syncytial Virus by PCR NEGATIVE NEGATIVE  Comprehensive metabolic panel  Result Value Ref Range   Sodium 122 (L) 135 - 145 mmol/L   Potassium 3.2 (L) 3.5 - 5.1 mmol/L   Chloride 78 (L) 98 - 111 mmol/L   CO2 34 (H) 22 - 32 mmol/L   Glucose, Bld 113 (H) 70 - 99 mg/dL   BUN 23 8 - 23 mg/dL   Creatinine, Ser 0.99 0.44 - 1.00 mg/dL   Calcium 7.9 (L) 8.9 - 10.3 mg/dL   Total Protein 5.5 (L) 6.5 - 8.1 g/dL   Albumin 2.8 (L) 3.5 - 5.0 g/dL   AST 16 15 - 41 U/L   ALT 8 0 - 44 U/L   Alkaline Phosphatase 60 38 - 126 U/L   Total Bilirubin 1.0 0.3 - 1.2 mg/dL   GFR, Estimated 59 (L) >60 mL/min   Anion gap 10 5 - 15  CBC  Result Value Ref Range   WBC 13.3 (H) 4.0 - 10.5 K/uL   RBC 3.82 (L) 3.87 - 5.11 MIL/uL   Hemoglobin 11.8 (L) 12.0 - 15.0 g/dL   HCT 34.7 (L) 36.0 - 46.0 %   MCV 90.8 80.0 - 100.0 fL   MCH 30.9 26.0 - 34.0 pg   MCHC 34.0 30.0 - 36.0 g/dL   RDW 13.1 11.5 - 15.5 %   Platelets 162 150 - 400 K/uL   nRBC 0.0 0.0 - 0.2 %  Urinalysis, Routine w reflex microscopic -Urine, Catheterized  Result Value Ref Range   Color, Urine YELLOW YELLOW   APPearance CLOUDY (A) CLEAR   Specific Gravity, Urine 1.004 (L) 1.005 - 1.030   pH 6.0 5.0 - 8.0   Glucose, UA NEGATIVE NEGATIVE mg/dL   Hgb urine dipstick MODERATE (A) NEGATIVE   Bilirubin Urine NEGATIVE NEGATIVE   Ketones, ur NEGATIVE NEGATIVE mg/dL   Protein, ur NEGATIVE NEGATIVE mg/dL   Nitrite NEGATIVE NEGATIVE   Leukocytes,Ua LARGE (A) NEGATIVE  RBC / HPF 6-10 0 - 5 RBC/hpf   WBC, UA >50 0 - 5 WBC/hpf   Bacteria, UA MANY (A) NONE SEEN   Squamous Epithelial / HPF 0-5 0 - 5 /HPF   WBC Clumps PRESENT   Lactic acid,  plasma  Result Value Ref Range   Lactic Acid, Venous 1.1 0.5 - 1.9 mmol/L  Magnesium  Result Value Ref Range   Magnesium 1.9 1.7 - 2.4 mg/dL   *Note: Due to a large number of results and/or encounters for the requested time period, some results have not been displayed. A complete set of results can be found in Results Review.   DG Chest Port 1 View  Result Date: 11/20/2022 CLINICAL DATA:  Fever. EXAM: PORTABLE CHEST 1 VIEW COMPARISON:  07/13/2022. FINDINGS: Unchanged cardiomegaly. Stable mediastinal contours. Pulmonary venous congestion. No focal airspace opacity. No pleural effusion or pneumothorax. IMPRESSION: Pulmonary venous congestion without overt pulmonary edema. No consolidation. Electronically Signed   By: Emmit Alexanders M.D.   On: 11/20/2022 12:42     EKG EKG Interpretation  Date/Time:  Monday November 20 2022 11:50:52 EDT Ventricular Rate:  122 PR Interval:    QRS Duration: 104 QT Interval:  342 QTC Calculation: 488 R Axis:   6 Text Interpretation: Atrial fibrillation Borderline prolonged QT interval Confirmed by Lajean Saver [phone]) on 11/20/2022 12:40:29 PM  Radiology DG Chest Port 1 View  Result Date: 11/20/2022 CLINICAL DATA:  Fever. EXAM: PORTABLE CHEST 1 VIEW COMPARISON:  07/13/2022. FINDINGS: Unchanged cardiomegaly. Stable mediastinal contours. Pulmonary venous congestion. No focal airspace opacity. No pleural effusion or pneumothorax. IMPRESSION: Pulmonary venous congestion without overt pulmonary edema. No consolidation. Electronically Signed   By: Emmit Alexanders M.D.   On: 11/20/2022 12:42    Procedures Procedures    Medications Ordered in ED Medications  cefTRIAXone (ROCEPHIN) 1 g in sodium chloride 0.9 % 100 mL IVPB (has no administration in time range)  lactated ringers bolus 1,000 mL (1,000 mLs Intravenous New Bag/Given 11/20/22 1159)  sodium chloride 0.9 % bolus 1,000 mL (1,000 mLs Intravenous New Bag/Given 11/20/22 1438)  potassium chloride SA (KLOR-CON M)  CR tablet 40 mEq (40 mEq Oral Given 11/20/22 1437)    ED Course/ Medical Decision Making/ A&P                             Medical Decision Making Problems Addressed: Dehydration: acute illness or injury that poses a threat to life or bodily functions Generalized weakness: acute illness or injury with systemic symptoms Hypokalemia: acute illness or injury Hyponatremia: acute illness or injury with systemic symptoms that poses a threat to life or bodily functions Sepsis due to urinary tract infection: acute illness or injury with systemic symptoms that poses a threat to life or bodily functions  Amount and/or Complexity of Data Reviewed Independent Historian: EMS    Details: hx External Data Reviewed: notes. Labs: ordered. Decision-making details documented in ED Course. Radiology: ordered and independent interpretation performed. Decision-making details documented in ED Course. ECG/medicine tests: ordered and independent interpretation performed. Decision-making details documented in ED Course.  Risk Prescription drug management. Decision regarding hospitalization.   Iv ns. Continuous pulse ox and cardiac monitoring. Labs ordered/sent. Imaging ordered.   Differential diagnosis includes sepsis, uti, dehydration, etc. Dispo decision including potential need for admission considered - will get labs and imaging and reassess.   Reviewed nursing notes and prior charts for additional history. External reports reviewed. Additional history from:  EMS.  Cardiac monitor: sinus rhythm, rate 74.  Labs reviewed/interpreted by me - na low. Ns bolus. Await ua.   Xrays reviewed/interpreted by me - no pna.   Additional labs reviewed/interpreted by me - ua c/w uti. Culture sent. Rocephin iv.  Medicine consulted for admission.  CRITICAL CARE RE: sepsis/urosepsis, weakness, hyponatremia.  Performed by: Mirna Mires Total critical care time: 40 minutes Critical care time was exclusive of  separately billable procedures and treating other patients. Critical care was necessary to treat or prevent imminent or life-threatening deterioration. Critical care was time spent personally by me on the following activities: development of treatment plan with patient and/or surrogate as well as nursing, discussions with consultants, evaluation of patient's response to treatment, examination of patient, obtaining history from patient or surrogate, ordering and performing treatments and interventions, ordering and review of laboratory studies, ordering and review of radiographic studies, pulse oximetry and re-evaluation of patient's condition.          Final Clinical Impression(s) / ED Diagnoses Final diagnoses:  None    Rx / DC Orders ED Discharge Orders     None         Lajean Saver, MD 11/20/22 [phone]

## 2022-11-20 NOTE — ED Notes (Signed)
Informed RN Chloe that pt was sating at 2, she said pt would return to normal shortly, pt sating at 67 currently

## 2022-11-20 NOTE — ED Notes (Signed)
Pt cleaned and changed at this time. New linen applied.

## 2022-11-20 NOTE — ED Notes (Signed)
Pt Apnea alarm, Pt Sating at 100- silenced.

## 2022-11-20 NOTE — ED Notes (Signed)
RN Westly Pam is at bedside. Alarms are alarming Tachy.

## 2022-11-20 NOTE — H&P (Signed)
History and Physical   Toni [name] [id number] DOB: [birth year] DOA: 11/20/2022  PCP: Jodi Marble, MD   Patient coming from: Home  Chief Complaint: Fevers and dysuria.  HPI: Toni [name] is a 77 y.o. 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  Patient reports ongoing fevers for the past 2 days.  Measured fever to 102 at home.  Noted to have dysuria and history of UTI with recent admission for the same.  Denies any abdominal pain or cough.  Denies any neck pain or stiffness. Feeling tired today.  Reports her torsemide was double Tues, Weds, Thurs 2/2 elevated BNP/Edema.  Further denies chills, chest pain, shortness of breath, nausea, vomiting, diarrhea, constipation.  ED Course: Vital signs in the ED notable for blood pressure in the 100s to 110s which appears to be near her baseline, heart rate in the 90s to 100s.  CMP with sodium 122, potassium 3.2, chloride 78, bicarb 34, glucose 113, calcium 7.9, protein 5.5, albumin 2.8.  CBC with leukocytosis to 13.3, hemoglobin stable 11.8.  Lactic acid normal.  Respiratory panel for flu COVID RSV negative.  Urinalysis with hemoglobin, leukocytes, bacteria.  Urine culture pending.  Chest x-ray showed pulmonary Congestion without overt edema and no evidence of consolidation.  Magnesium normal.  Patient received ceftriaxone, liter of IV fluids, 40 mill equivalents p.o. potassium in the ED.  Review of Systems: As per HPI otherwise all other systems reviewed and are negative.  Past Medical History:  Diagnosis Date   Abnormality of gait [id number]   Acute metabolic encephalopathy [id number]   Anxiety    Arthritis    "knees" (01/19/2016)   Asthma    as a child    Atrial fibrillation     Back pain    Bilateral carpal tunnel syndrome 10/04/2020   Cancer    / RENAL CELL CARCINOMA - GETS CT SCAN EVERY 6 MONTHS TO WATCH    CHF (congestive heart failure)    Chronic pain    "nerve pain from the MS"   Complex atypical endometrial hyperplasia    Constipation    COVID-19 virus infection 08/23/2019   Depression    DVT (deep venous thrombosis) 1983   "RLE; may have just been phlebitis; it was before the age of dopplers"   Dysrhythmia    afib    Edema    varicose veins with severe venous insuff in R and L GSV' ablation of R GSV 2012   GERD (gastroesophageal reflux disease)    HLD (hyperlipidemia)    Hypertension    Hypotension    Hypothyroidism    Joint pain    Multiple sclerosis    has had this may 1989-[facility] reg doc   OSA on CPAP    bipap machine - SLEEP APNEA WORSE SINCE COVID IN 1/21 PER PATIENT SETTING HAS INCREASED FROM 9 TO 30    Osteoarthritis    PAF (paroxysmal atrial fibrillation)    on coumadin; documented on monitor 06/2010 - no longer takes per Kym Groom 02/09/21   Pneumonia 11/2011   Sleep apnea    uses sleep apnea machine    Past Surgical History:  Procedure Laterality Date   ANTERIOR CERVICAL DECOMP/DISCECTOMY FUSION  01/2004   CARDIAC CATHETERIZATION  06/22/2010   normal coronary arteries, PAF   CATARACT EXTRACTION, BILATERAL Bilateral  CHILD BIRTHS     X2   DILATATION & CURETTAGE/HYSTEROSCOPY WITH MYOSURE N/A 01/15/2020   Procedure: DILATATION & CURETTAGE/HYSTEROSCOPY WITH MYOSURE;  Surgeon: Arvella Nigh, MD;  Location: [facility];  Service: Gynecology;  Laterality: N/A;  pt in wheelchair-has MS   DILATION AND CURETTAGE OF UTERUS     DILATION AND CURETTAGE OF UTERUS N/A 02/16/2020   Procedure: DILATATION AND CURETTAGE;  Surgeon: Lafonda Mosses, MD;  Location: [facility];  Service: Gynecology;  Laterality: N/A;  NOT GENERAL -NO INTUBATION   DNC     INFUSION PUMP IMPLANTATION  ~ 2009   baclofen infusion in lower abd   INTRATHECAL PUMP  REVISION N/A 02/18/2021   Procedure: Baclofen Pump replacement with possible revision of catheter;  Surgeon: Erline Levine, MD;  Location: [facility];  Service: Neurosurgery;  Laterality: N/A;  3C/RM 20   INTRAUTERINE DEVICE (IUD) INSERTION N/A 02/16/2020   Procedure: INTRAUTERINE DEVICE (IUD) INSERTION - MIRENA;  Surgeon: Lafonda Mosses, MD;  Location: [facility];  Service: Gynecology;  Laterality: N/A;   IUD put in uterus     PAIN PUMP REVISION N/A 07/29/2014   Procedure: Baclofen pump replacement;  Surgeon: Erline Levine, MD;  Location: [place];  Service: Neurosurgery;  Laterality: N/A;  Baclofen pump replacement   TONSILLECTOMY AND ADENOIDECTOMY     VARICOSE VEIN SURGERY  ~ 1968   VENOUS ABLATION  12/16/2010   radiofreq ablation -Dr Elisabeth Cara and [place] History  reports that she quit smoking about 46 years ago. Her smoking use included cigarettes. She has a 3.50 pack-year smoking history. She has never used smokeless tobacco. She reports that she does not currently use alcohol. She reports that she does not use drugs.  Allergies  Allergen Reactions   Other Hives and Rash    Topical Surgical prep  - severe rash, hives   Hydrocodone Nausea And Vomiting   Oxycontin [Oxycodone] Nausea And Vomiting   Zoloft [Sertraline] Hives, Swelling and Rash   Chloraseptic [Phenol] Hives    Not documented on MAR   Macrobid [Nitrofurantoin] Nausea And Vomiting    Not documented on MAR   Vioxx [Rofecoxib] Other (See Comments)    Unknown reaction Not documented on MAR   Hibiclens [Chlorhexidine Gluconate] Hives, Rash and Other (See Comments)    Sores    Family History  Problem Relation Age of Onset   Depression Mother    Sudden death Mother    Anxiety disorder Mother    Coronary artery disease Father        at age 24   Hyperlipidemia Father    Thyroid disease Father    Coronary artery disease Maternal Grandmother    Depression Maternal Grandmother     Cancer Paternal Grandmother        breast   Alcoholism Son    Sleep apnea Neg Hx    Multiple sclerosis Neg Hx   Reviewed on admission  Prior to Admission medications   Medication Sig Start Date End Date Taking? Authorizing Provider  acetaminophen (TYLENOL) 500 MG tablet Take 1,000 mg by mouth every 8 (eight) hours as needed for fever or headache (pain).   Yes [provider]  ALPRAZolam (XANAX) 0.5 MG tablet Take 1 tablet (0.5 mg total) by mouth at bedtime. May also take 1 tablet every 12 hours as needed anxiety Patient taking differently: Take 0.5 mg by mouth at bedtime. 07/17/22  Yes Ghimire, Henreitta Leber, MD  apixaban (ELIQUIS) 5 MG TABS tablet Take 5 mg by mouth 2 (two) times daily.   Yes [provider]  baclofen (LIORESAL) 20 MG tablet Take 20 mg by mouth daily as needed for muscle spasms.   Yes [provider]  Biotin 10000 MCG TABS Take 10,000 mcg by mouth daily after lunch.   Yes [provider]  bisacodyl (DULCOLAX) 10 MG suppository Place 10 mg rectally daily as needed (constipation).   Yes [provider]  buPROPion (WELLBUTRIN XL) 150 MG 24 hr tablet Take 1 tablet (150 mg total) by mouth daily. 10/18/20  Yes Dohmeier, Asencion Partridge, MD  Calcium Carb-Cholecalciferol (CALCIUM 500/D) 500-10 MG-MCG CHEW Chew 1 tablet by mouth 2 (two) times daily.   Yes [provider]  cetirizine (ZYRTEC) 10 MG tablet Take 10 mg by mouth daily as needed for allergies.   Yes [provider]  cholecalciferol 25 MCG (1000 UT) tablet Take 2,000 Units by mouth 2 (two) times daily with breakfast and lunch.   Yes [provider]  clotrimazole (LOTRIMIN) 1 % cream Apply 1 Application topically 2 (two) times daily. Apply to neck folds.   Yes [provider]  corticotropin (ACTHAR) 80 UNIT/ML injectable gel Inject 1 mL (80 Units total) into the skin every other day for 10 days. May give additional doses PRN. Patient taking differently: Inject  80 Units into the skin See admin instructions. Inject 80 units as needed (about 5 times a month) 05/16/22 07/15/23 Yes Dohmeier, Asencion Partridge, MD  D-Mannose 500 MG CAPS Take 2,000 mg by mouth daily.   Yes [provider]  diclofenac Sodium (VOLTAREN) 1 % GEL Apply 2 g topically 3 (three) times daily as needed. Patient taking differently: Apply 2 g topically 3 (three) times daily as needed (pain). To bilateral shoulders 12/08/20  Yes Mcarthur Rossetti, MD  diphenhydrAMINE (BENADRYL) 25 mg capsule Take 25 mg by mouth 3 (three) times daily as needed (minor itching, irritation).   Yes [provider]  Emollient (CERAVE DAILY MOISTURIZING) LOTN Apply 1 application  topically daily. To groin, inguinal area   Yes [provider]  escitalopram (LEXAPRO) 10 MG tablet Take 1 tablet (10 mg total) by mouth daily. Patient taking differently: Take 10 mg by mouth at bedtime. 10/18/20  Yes Dohmeier, Asencion Partridge, MD  famotidine (PEPCID) 20 MG tablet Take 20 mg by mouth at bedtime.   Yes [provider]  gabapentin (NEURONTIN) 300 MG capsule Mrs. Virnig will take 300 mg of gabapentin at 6 AM, 10 AM, 2 PM and 6 PM and 600 mg at 10 PM by mouth. Patient taking differently: Take 300 mg by mouth See admin instructions. 300 mg 5 times daily at 0600, 1000, 1400, 1800 and 300-600 mg at bedtime. 03/07/21  Yes Dohmeier, Asencion Partridge, MD  lactulose (CHRONULAC) 10 GM/15ML solution Take 15 mLs (10 g total) by mouth 2 (two) times daily as needed for mild constipation. Patient taking differently: Take 10 g by mouth 2 (two) times daily. 07/17/22  Yes Ghimire, Henreitta Leber, MD  levothyroxine (SYNTHROID) 75 MCG tablet Take 75 mcg by mouth daily before breakfast.   Yes [provider]  melatonin 5 MG TABS Take 5 mg by mouth at bedtime.   Yes [provider]  metolazone (ZAROXOLYN) 2.5 MG tablet Take 2.5 mg by mouth daily as needed (weight gain > 3 lbs in 2 days with swelling.).   Yes [provider]  metoprolol succinate (TOPROL-XL) 25 MG 24 hr tablet Take 0.5  tablets (12.5 mg total) by mouth daily. Take with or immediately following a meal. 07/17/22 07/12/23 Yes Ghimire, Henreitta Leber, MD  Multiple Vitamins-Minerals (CEROVITE SENIOR) TABS Take 1 tablet by mouth daily.   Yes [provider]  NONFORMULARY OR COMPOUNDED ITEM Apply 1 Application topically See admin instructions. 1:1 clotrimazole 1% and hydrocortisone 1%. Apply twice daily to perineal region and abdominal folds.   Yes [provider]  omeprazole (PRILOSEC) 40 MG capsule Take 1 capsule (40 mg total) by mouth every morning. 11/16/21  Yes Levin Erp, PA  ondansetron (ZOFRAN-ODT) 4 MG disintegrating tablet Take 4 mg by mouth 4 (four) times daily.   Yes [provider]  phenazopyridine (PYRIDIUM) 100 MG tablet Take 100 mg by mouth every 8 (eight) hours as needed (dysuria).   Yes [provider]  phenylephrine-shark liver oil-mineral oil-petrolatum (PREPARATION H) 0.25-14-74.9 % rectal ointment Place 1 Application rectally every 6 (six) hours as needed (rectum discomfort).   Yes [provider]  potassium chloride SA (KLOR-CON) 20 MEQ tablet Take 2 tablets (40 mEq total) by mouth daily. 01/03/21  Yes Hilty, Nadean Corwin, MD  promethazine (PHENERGAN) 12.5 MG tablet Take 12.5 mg by mouth every 8 (eight) hours as needed for nausea or vomiting.   Yes [provider]  Propylene Glycol-Glycerin (ARTIFICIAL TEARS) 1-0.3 % SOLN Place 1-2 drops into both eyes See admin instructions. 2 entries on MAR: 1) 1 drop into each eye twice daily 2) 2 drops into each eye once daily as needed for dry eyes   Yes [provider]  Prucalopride Succinate (MOTEGRITY) 2 MG TABS Take 2 mg by mouth daily.   Yes [provider]  Psyllium (METAMUCIL PO) Take 1 packet by mouth 2 (two) times daily. Metamucil 2.4g/5.4g powder packets.   Yes [provider]  senna (SENOKOT)  8.6 MG TABS tablet Take 17.2 mg by mouth 2 (two) times daily.   Yes [provider]  sodium phosphate (FLEET) 7-19 GM/118ML ENEM Place 1 enema rectally daily as needed (constipation).   Yes [provider]  torsemide (DEMADEX) 20 MG tablet Take 2 tablets (40 mg total) by mouth daily. Patient taking differently: Take 20-30 mg by mouth See admin instructions. 20 mg every morning, 30 mg in the evening. 07/17/22  Yes Ghimire, Henreitta Leber, MD  midodrine (PROAMATINE) 2.5 MG tablet Take 3 tablets (7.5 mg total) by mouth 3 (three) times daily with meals. Patient not taking: Reported on 11/20/2022 07/17/22   [name], MD  senna-docusate (SENOKOT-S) 8.6-50 MG tablet Take 2 tablets by mouth at bedtime. Patient not taking: Reported on 11/20/2022 07/17/22   [name], MD    Physical Exam: Vitals:   11/20/22 1445 11/20/22 1500 11/20/22 1600 11/20/22 1641  BP: (!) 104/50 (!) 103/50 118/62   Pulse: (!) 42 73 (!) 109   Resp: 14 11 11    Temp:    98.8 F (37.1 C)  TempSrc:    Oral  SpO2: (!) 84% 100% 100%     Physical Exam Constitutional:      General: She is not in acute distress.    Appearance: Normal appearance. She is obese.  HENT:     Head: Normocephalic and atraumatic.     Mouth/Throat:     Mouth: Mucous membranes are moist.     Pharynx: Oropharynx is clear.  Eyes:     Extraocular Movements: Extraocular movements intact.     Pupils: Pupils are equal, round, and reactive to light.  Cardiovascular:     Rate and Rhythm: Tachycardia present. Rhythm irregular.     Pulses: Normal pulses.     Heart sounds: Normal heart sounds.  Pulmonary:     Effort: Pulmonary effort is normal. No respiratory distress.     Breath sounds: Normal breath sounds.  Abdominal:     General: Bowel sounds are normal. There is no distension.     Palpations: Abdomen is soft.     Tenderness: There is no abdominal tenderness.  Musculoskeletal:        General: No swelling or deformity.   Skin:    General: Skin is warm and dry.  Neurological:     General: No focal deficit present.     Mental Status: Mental status is at baseline.    Labs on Admission: I have personally reviewed following labs and imaging studies  CBC: Recent Labs  Lab 11/20/22 1156  WBC 13.3*  HGB 11.8*  HCT 34.7*  MCV 90.8  PLT 0000000    Basic Metabolic Panel: Recent Labs  Lab 11/20/22 1156  NA 122*  K 3.2*  CL 78*  CO2 34*  GLUCOSE 113*  BUN 23  CREATININE 0.99  CALCIUM 7.9*  MG 1.9    GFR: CrCl cannot be calculated (Unknown ideal weight.).  Liver Function Tests: Recent Labs  Lab 11/20/22 1156  AST 16  ALT 8  ALKPHOS 60  BILITOT 1.0  PROT 5.5*  ALBUMIN 2.8*    Urine analysis:    Component Value Date/Time   COLORURINE YELLOW 11/20/2022 1315   APPEARANCEUR CLOUDY (A) 11/20/2022 1315   APPEARANCEUR Clear 12/18/2014 1156   LABSPEC 1.004 (L) 11/20/2022 1315   PHURINE 6.0 11/20/2022 1315   GLUCOSEU NEGATIVE 11/20/2022 1315   HGBUR MODERATE (A) 11/20/2022 1315   BILIRUBINUR NEGATIVE 11/20/2022 1315   BILIRUBINUR Negative 12/18/2014 1156   KETONESUR NEGATIVE 11/20/2022 1315   PROTEINUR NEGATIVE 11/20/2022 1315   UROBILINOGEN 0.2 08/05/2014 0951   NITRITE NEGATIVE 11/20/2022 1315   LEUKOCYTESUR LARGE (A) 11/20/2022 1315    Radiological Exams on Admission: DG Chest Port 1 View  Result Date: 11/20/2022 CLINICAL DATA:  Fever. EXAM: PORTABLE CHEST 1 VIEW COMPARISON:  07/13/2022. FINDINGS: Unchanged cardiomegaly. Stable mediastinal contours. Pulmonary venous congestion. No focal airspace opacity. No pleural effusion or pneumothorax. IMPRESSION: Pulmonary venous congestion without overt pulmonary edema. No consolidation. Electronically Signed   By: Emmit Alexanders M.D.   On: 11/20/2022 12:42    EKG: Independently reviewed.  Atrial fibrillation at 120 bpm.  QTc borderline at 488.  Nonspecific T wave flattening.  Assessment/Plan Principal Problem:   Sepsis secondary to  UTI Active Problems:   Permanent atrial fibrillation   Hypothyroidism   HLD (hyperlipidemia)   Neurogenic urinary bladder disorder   Obesity hypoventilation syndrome (HCC)   Depression with anxiety   Obstructive sleep apnea treated with bilevel positive airway pressure (BiPAP)   Multiple sclerosis   Neurogenic hypoventilation   Chronic diastolic CHF (congestive heart failure)   Class 2 severe obesity with serious comorbidity and body mass index (BMI) of 35.0 to 35.9 in adult   Cushing's syndrome, ACTH dependent   UTI Early sepsis > Patient presenting with fevers and dysuria.  Urinalysis with hemoglobin, leukocytes, bacteria.  Urine cultures pending.  Leukocytosis to 13.3.  In addition to this leukocytosis with urinary source patient noted to have tachycardia which does meet criteria for sepsis. > Started on ceftriaxone in the ED and received a liter of fluids. - Monitor on progressive unit due  to concurrent issues as below - Continue ceftriaxone - Trend fever curve and WBC - Follow-up urine culture  Hyponatremia Hypokalemia > On presentation patient noted to have sodium of 122 and potassium 3.2. > Magnesium normal.  Received 40 mill equivalents p.o. potassium in the ED. > Due to worsening edema and reportedly elevated BNP did have her torsemide dose doubled for several days and this past week. > Has received a liter of IV fluids in the ED. - Trend renal function and electrolytes - Hold off on further IV fluids for now pending repeat labs, gentle IV fluids if any, recent volume overload. - Hold diuretic - Will add on OSM, Ur OSM, Ur Na  Multiple sclerosis Spasticity Neurogenic bladder Neurogenic hypoventilation > Patient with known history of MS with spasticity, neurogenic bladder and neurogenic hypoventilation.  Currently on baclofen pump for spasticity. - Continue with home baclofen pump - Treatment of hypoventilation as below  OSA OHS - Continue home BiPAP  nightly  Chronic combined diastolic and systolic CHF > Last echo was in 2023 with EF 45-50%, unable to determine diastolic function, normal RV function. - Holding torsemide in the setting of hyponatremia as above for now - Continue home metoprolol  ACTH dependent Cushing's syndrome - Continue home corticotropin  Atrial fibrillation - Continue home metoprolol - Home Eliquis  Hypothyroidism - Continue home Synthroid  Depression - Continue home bupropion, escitalopram - Continue home Xanax  DVT prophylaxis: Lovenox Code Status:   Full Family Communication:  Updated at bedside Disposition Plan:   Patient is from:  Home  Anticipated DC to:  Home  Anticipated DC date:  1 to 3 days  Anticipated DC barriers: None  Consults called:  None Admission status:  Observation, progressive  Severity of Illness: The appropriate patient status for this patient is OBSERVATION. Observation status is judged to be reasonable and necessary in order to provide the required intensity of service to ensure the patient's safety. The patient's presenting symptoms, physical exam findings, and initial radiographic and laboratory data in the context of their medical condition is felt to place them at decreased risk for further clinical deterioration. Furthermore, it is anticipated that the patient will be medically stable for discharge from the hospital within 2 midnights of admission.    Marcelyn Bruins MD [facility]  How to contact the [facility] Attending or Consulting provider [place] or covering provider during after hours [place], for this patient?   Check the care team in [facility] and look for a) attending/consulting [facility] provider listed and b) the [facility] team listed Log into [email or web address] and use [place]'s universal password to access. If you do not have the password, please contact the hospital operator. Locate the [facility] provider you are looking for under [facility] and page to a number that  you can be directly reached. If you still have difficulty reaching the provider, please page the [facility] (Director on Call) for the Hospitalists listed on amion for assistance.  11/20/2022, 5:21 PM

## 2022-11-20 NOTE — ED Notes (Signed)
Pt lethal alarming Tachy, I asked RN when to be alarmed as pt is running Tachy, 150 is the threshold for concern, Pt running130

## 2022-11-20 NOTE — ED Notes (Signed)
ED TO INPATIENT HANDOFF REPORT  ED Nurse Name and Phone #: [phone], Cathlean Sauer Name/Age/Gender Toni [name] 77 y.o. female Room/Bed: TRAAC/TRAAC  Code Status   Code Status: Full Code  Home/SNF/Other Nursing Home Patient oriented to: self, place, time, and situation Is this baseline? Yes   Triage Complete: Triage complete  Chief Complaint Sepsis secondary to UTI [A41.9, N39.0]  Triage Note Pt BIB [facility] from [facility] as a code sepsis. Pt frequently has UTIs. Pt noticed she started having burning sensation when she urinates. Pt was in AFIB RVR upon EMS arrival. HR was 140.BP90/60. EMS given 1L LR which brought the pressure up 110/50. HR 110. A&O X4. 2Lnasal  cannula applied by EMS.temp 101.6. 500 mg tylenol given by facility.    Allergies Allergies  Allergen Reactions   Other Hives and Rash    Topical Surgical prep  - severe rash, hives   Hydrocodone Nausea And Vomiting   Oxycontin [Oxycodone] Nausea And Vomiting   Zoloft [Sertraline] Hives, Swelling and Rash   Chloraseptic [Phenol] Hives    Not documented on MAR   Macrobid [Nitrofurantoin] Nausea And Vomiting    Not documented on MAR   Vioxx [Rofecoxib] Other (See Comments)    Unknown reaction Not documented on MAR   Hibiclens [Chlorhexidine Gluconate] Hives, Rash and Other (See Comments)    Sores    Level of Care/Admitting Diagnosis ED Disposition     ED Disposition  Admit   Condition  --   [place]: [place] [[id number]]  Level of Care: Progressive [102]  Admit to Progressive based on following criteria: MULTISYSTEM THREATS such as stable sepsis, metabolic/electrolyte imbalance with or without encephalopathy that is responding to early treatment.  May place patient in observation at [facility] or [place] if equivalent level of care is available:: No  Covid Evaluation: Confirmed COVID Negative  Diagnosis: Sepsis secondary to UTI [id number]  Admitting Physician:  Marcelyn Bruins [id number]  Attending Physician: Marcelyn Bruins [id number]          B Medical/Surgery History Past Medical History:  Diagnosis Date   Abnormality of gait [id number]   Acute metabolic encephalopathy [id number]   Anxiety    Arthritis    "knees" (01/19/2016)   Asthma    as a child    Atrial fibrillation    Back pain    Bilateral carpal tunnel syndrome 10/04/2020   Cancer    / RENAL CELL CARCINOMA - GETS CT SCAN EVERY 6 MONTHS TO WATCH    CHF (congestive heart failure)    Chronic pain    "nerve pain from the MS"   Complex atypical endometrial hyperplasia    Constipation    COVID-19 virus infection 08/23/2019   Depression    DVT (deep venous thrombosis) 1983   "RLE; may have just been phlebitis; it was before the age of dopplers"   Dysrhythmia    afib    Edema    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 GSV' ablation of R GSV 2012   GERD (gastroesophageal reflux disease)    HLD (hyperlipidemia)    Hypertension    Hypotension    Hypothyroidism    Joint pain    Multiple sclerosis    has had this may 1989-[place] reg doc   OSA on CPAP    bipap machine - SLEEP APNEA WORSE SINCE COVID IN 1/21 PER PATIENT SETTING HAS INCREASED FROM 9 TO 30  Osteoarthritis    PAF (paroxysmal atrial fibrillation)    on coumadin; documented on monitor 06/2010 - no longer takes per Kym Groom 02/09/21   Pneumonia 11/2011   Sleep apnea    uses sleep apnea machine   Past Surgical History:  Procedure Laterality Date   ANTERIOR CERVICAL DECOMP/DISCECTOMY FUSION  01/2004   CARDIAC CATHETERIZATION  06/22/2010   normal coronary arteries, PAF   CATARACT EXTRACTION, BILATERAL Bilateral    CHILD BIRTHS     X2   DILATATION & CURETTAGE/HYSTEROSCOPY WITH MYOSURE N/A 01/15/2020   Procedure: DILATATION & CURETTAGE/HYSTEROSCOPY WITH MYOSURE;  Surgeon: Arvella Nigh, MD;  Location: [facility];  Service: Gynecology;  Laterality: N/A;  pt in wheelchair-has MS   DILATION  AND CURETTAGE OF UTERUS     DILATION AND CURETTAGE OF UTERUS N/A 02/16/2020   Procedure: DILATATION AND CURETTAGE;  Surgeon: Lafonda Mosses, MD;  Location: [facility];  Service: Gynecology;  Laterality: N/A;  NOT GENERAL -NO INTUBATION   DNC     INFUSION PUMP IMPLANTATION  ~ 2009   baclofen infusion in lower abd   INTRATHECAL PUMP REVISION N/A 02/18/2021   Procedure: Baclofen Pump replacement with possible revision of catheter;  Surgeon: Erline Levine, MD;  Location: [place];  Service: Neurosurgery;  Laterality: N/A;  3C/RM 20   INTRAUTERINE DEVICE (IUD) INSERTION N/A 02/16/2020   Procedure: INTRAUTERINE DEVICE (IUD) INSERTION - MIRENA;  Surgeon: Lafonda Mosses, MD;  Location: [facility];  Service: Gynecology;  Laterality: N/A;   IUD put in uterus     PAIN PUMP REVISION N/A 07/29/2014   Procedure: Baclofen pump replacement;  Surgeon: Erline Levine, MD;  Location: [facility];  Service: Neurosurgery;  Laterality: N/A;  Baclofen pump replacement   TONSILLECTOMY AND ADENOIDECTOMY     VARICOSE VEIN SURGERY  ~ 1968   VENOUS ABLATION  12/16/2010   radiofreq ablation -Dr Elisabeth Cara and [place] EXTRACTION       A IV Location/Drains/Wounds Patient Lines/Drains/Airways Status     Active Line/Drains/Airways     Name Placement date Placement time Site Days   Peripheral IV 11/20/22 20 G Posterior;Right Hand 11/20/22  --  Hand  less than 1   Incision (Closed) 02/18/21 Flank Right 02/18/21  1704  -- 640   Wound / Incision (Open or Dehisced) 01/03/21 (MASD) Moisture Associated Skin Damage Sacrum Medial 01/03/21  2215  Sacrum  686            Intake/Output Last 24 hours  Intake/Output Summary (Last 24 hours) at 11/20/2022 1730 Last data filed at 11/20/2022 1707 Gross per 24 hour  Intake 2000 ml  Output --  Net 2000 ml    Labs/Imaging Results for orders placed or performed during the hospital encounter of 11/20/22 (from the past 48 hour(s))  Lactic acid, plasma     Status: None    Collection Time: 11/20/22 11:49 AM  Result Value Ref Range   Lactic Acid, Venous 1.1 0.5 - 1.9 mmol/L    Comment: Performed at [facility], [place] [address] [place], [place] [address]  Resp panel by RT-PCR (RSV, Flu A&B, Covid) Anterior Nasal Swab     Status: None   Collection Time: 11/20/22 11:53 AM   Specimen: Anterior Nasal Swab  Result Value Ref Range   SARS Coronavirus 2 by RT PCR NEGATIVE NEGATIVE   Influenza A by PCR NEGATIVE NEGATIVE   Influenza B by PCR NEGATIVE NEGATIVE    Comment: (NOTE) The Xpert  Xpress SARS-CoV-2/FLU/RSV plus assay is intended as an aid in the diagnosis of influenza from Nasopharyngeal swab specimens and should not be used as a sole basis for treatment. Nasal washings and aspirates are unacceptable for Xpert Xpress SARS-CoV-2/FLU/RSV testing.  Fact Sheet for Patients: EntrepreneurPulse.com.au  Fact Sheet for Healthcare Providers: IncredibleEmployment.be  This test is not yet approved or cleared by the [place] FDA and has been authorized for detection and/or diagnosis of SARS-CoV-2 by FDA under an Emergency Use Authorization (EUA). This EUA will remain in effect (meaning this test can be used) for the duration of the COVID-19 declaration under Section 564(b)(1) of the Act, 21 U.S.C. section 360bbb-3(b)(1), unless the authorization is terminated or revoked.     Resp Syncytial Virus by PCR NEGATIVE NEGATIVE    Comment: (NOTE) Fact Sheet for Patients: EntrepreneurPulse.com.au  Fact Sheet for Healthcare Providers: IncredibleEmployment.be  This test is not yet approved or cleared by the [place] FDA and has been authorized for detection and/or diagnosis of SARS-CoV-2 by FDA under an Emergency Use Authorization (EUA). This EUA will remain in effect (meaning this test can be used) for the duration of the COVID-19 declaration under Section 564(b)(1) of the Act, 21  U.S.C. section 360bbb-3(b)(1), unless the authorization is terminated or revoked.  Performed at [facility], [place] [address], [place], [place] [address]   Comprehensive metabolic panel     Status: Abnormal   Collection Time: 11/20/22 11:56 AM  Result Value Ref Range   Sodium 122 (L) 135 - 145 mmol/L   Potassium 3.2 (L) 3.5 - 5.1 mmol/L   Chloride 78 (L) 98 - 111 mmol/L   CO2 34 (H) 22 - 32 mmol/L   Glucose, Bld 113 (H) 70 - 99 mg/dL    Comment: Glucose reference range applies only to samples taken after fasting for at least 8 hours.   BUN 23 8 - 23 mg/dL   Creatinine, Ser 0.99 0.44 - 1.00 mg/dL   Calcium 7.9 (L) 8.9 - 10.3 mg/dL   Total Protein 5.5 (L) 6.5 - 8.1 g/dL   Albumin 2.8 (L) 3.5 - 5.0 g/dL   AST 16 15 - 41 U/L   ALT 8 0 - 44 U/L   Alkaline Phosphatase 60 38 - 126 U/L   Total Bilirubin 1.0 0.3 - 1.2 mg/dL   GFR, Estimated 59 (L) >60 mL/min    Comment: (NOTE) Calculated using the CKD-EPI Creatinine Equation (2021)    Anion gap 10 5 - 15    Comment: Performed at [facility], [place] [address] [place], [place] [address]  CBC     Status: Abnormal   Collection Time: 11/20/22 11:56 AM  Result Value Ref Range   WBC 13.3 (H) 4.0 - 10.5 K/uL   RBC 3.82 (L) 3.87 - 5.11 MIL/uL   Hemoglobin 11.8 (L) 12.0 - 15.0 g/dL   HCT 34.7 (L) 36.0 - 46.0 %   MCV 90.8 80.0 - 100.0 fL   MCH 30.9 26.0 - 34.0 pg   MCHC 34.0 30.0 - 36.0 g/dL   RDW 13.1 11.5 - 15.5 %   Platelets 162 150 - 400 K/uL   nRBC 0.0 0.0 - 0.2 %    Comment: Performed at [facility], [place] [address] [place],  [address]  Magnesium     Status: None   Collection Time: 11/20/22 11:56 AM  Result Value Ref Range   Magnesium 1.9 1.7 - 2.4 mg/dL    Comment: Performed at  [facility], [place] [address], [place], [place] [address]  Urinalysis, Routine w reflex microscopic -Urine, Catheterized     Status: Abnormal   Collection Time: 11/20/22  1:15 PM  Result Value Ref Range   Color, Urine  YELLOW YELLOW   APPearance CLOUDY (A) CLEAR   Specific Gravity, Urine 1.004 (L) 1.005 - 1.030   pH 6.0 5.0 - 8.0   Glucose, UA NEGATIVE NEGATIVE mg/dL   Hgb urine dipstick MODERATE (A) NEGATIVE   Bilirubin Urine NEGATIVE NEGATIVE   Ketones, ur NEGATIVE NEGATIVE mg/dL   Protein, ur NEGATIVE NEGATIVE mg/dL   Nitrite NEGATIVE NEGATIVE   Leukocytes,Ua LARGE (A) NEGATIVE   RBC / HPF 6-10 0 - 5 RBC/hpf   WBC, UA >50 0 - 5 WBC/hpf   Bacteria, UA MANY (A) NONE SEEN   Squamous Epithelial / HPF 0-5 0 - 5 /HPF   WBC Clumps PRESENT     Comment: Performed at [facility], [place] [address], [place], [place] [address]   *Note: Due to a large number of results and/or encounters for the requested time period, some results have not been displayed. A complete set of results can be found in Results Review.   DG Chest Port 1 View  Result Date: 11/20/2022 CLINICAL DATA:  Fever. EXAM: PORTABLE CHEST 1 VIEW COMPARISON:  07/13/2022. FINDINGS: Unchanged cardiomegaly. Stable mediastinal contours. Pulmonary venous congestion. No focal airspace opacity. No pleural effusion or pneumothorax. IMPRESSION: Pulmonary venous congestion without overt pulmonary edema. No consolidation. Electronically Signed   By: Emmit Alexanders M.D.   On: 11/20/2022 12:42    Pending Labs Unresulted Labs (From admission, onward)     Start     Ordered   11/27/22 0500  Creatinine, serum  (enoxaparin (LOVENOX)    CrCl >/= 30 ml/min)  Weekly,   R     Comments: while on enoxaparin therapy    11/20/22 1720   11/21/22 0500  CBC  Tomorrow morning,   R        11/20/22 1720   11/20/22 [id number]  Basic metabolic panel  Now then every 4 hours,   R      11/20/22 1720   11/20/22 1601  Urine Culture  Once,   URGENT       Question:  Indication  Answer:  Sepsis   11/20/22 1600            Vitals/Pain Today's Vitals   11/20/22 1500 11/20/22 1600 11/20/22 1641 11/20/22 1700  BP: (!) 103/50 118/62  (!) 103/52  Pulse: 73 (!) 109  (!) 112   Resp: 11 11  14   Temp:   98.8 F (37.1 C)   TempSrc:   Oral   SpO2: 100% 100%  100%    Isolation Precautions No active isolations  Medications Medications  cefTRIAXone (ROCEPHIN) 1 g in sodium chloride 0.9 % 100 mL IVPB (has no administration in time range)  enoxaparin (LOVENOX) injection 40 mg (has no administration in time range)  sodium chloride flush (NS) 0.9 % injection 3 mL (has no administration in time range)  acetaminophen (TYLENOL) tablet 650 mg (has no administration in time range)    Or  acetaminophen (TYLENOL) suppository 650 mg (has no administration in time range)  polyethylene glycol (MIRALAX / GLYCOLAX) packet 17 g (has no administration in time range)  lactated ringers bolus 1,000 mL (0 mLs Intravenous Stopped 11/20/22 1707)  sodium chloride 0.9 % bolus 1,000 mL (0 mLs Intravenous Stopped 11/20/22 1707)  potassium chloride SA (KLOR-CON M) CR tablet 40 mEq (40 mEq Oral Given 11/20/22 1437)  cefTRIAXone (ROCEPHIN) 1 g in sodium chloride 0.9 % 100 mL IVPB (0 g Intravenous Stopped 11/20/22 1707)    Mobility non-ambulatory     Focused Assessments    R Recommendations: See Admitting Provider Note  Report given to:   Additional Notes:

## 2022-11-20 NOTE — ED Triage Notes (Signed)
Pt BIB [facility] from [facility] as a code sepsis. Pt frequently has UTIs. Pt noticed she started having burning sensation when she urinates. Pt was in AFIB RVR upon EMS arrival. HR was 140.BP90/60. EMS given 1L LR which brought the pressure up 110/50. HR 110. A&O X4. 2Lnasal  cannula applied by EMS.temp 101.6. 500 mg tylenol given by facility.

## 2022-11-20 NOTE — ED Provider Notes (Signed)
[facility] Provider Note   CSN: [id number] Arrival date & time: 11/20/22  1131     History  Chief Complaint  Patient presents with   Code Sepsis    Toni [name] is a 77 y.o. female.  Pt presents w fever to 102. Symptoms acute onset, moderate, persistent. Mild dysuria. Notes hx uti. No abd pain or nvd. No cough or uri symptoms. No headache. No neck pain/stiffness. Denies extremity pain or swelling. No skin lesions, redness or rash. No specific known ill contacts.   The history is provided by the patient, medical records and the EMS personnel.       Home Medications Prior to Admission medications   Medication Sig Start Date End Date Taking? Authorizing Provider  acetaminophen (TYLENOL) 500 MG tablet Take 1,000 mg by mouth every 8 (eight) hours as needed (fever/headache/pain).    [provider]  ALPRAZolam Duanne Moron) 0.5 MG tablet Take 1 tablet (0.5 mg total) by mouth at bedtime. May also take 1 tablet every 12 hours as needed anxiety 07/17/22   Ghimire, Henreitta Leber, MD  ARTIFICIAL TEARS 0.2-0.2-1 % SOLN Place 2 drops into both eyes daily as needed (for dry eyes). 06/09/21   [provider]  baclofen (LIORESAL) 20 MG tablet Take 20 mg by mouth daily as needed for muscle spasms.    [provider]  Biotin 10000 MCG TABS Take 10,000 mcg by mouth daily after lunch.    [provider]  bisacodyl (DULCOLAX) 10 MG suppository Place 10 mg rectally daily as needed for moderate constipation.    [provider]  buPROPion (WELLBUTRIN XL) 150 MG 24 hr tablet Take 1 tablet (150 mg total) by mouth daily. Patient taking differently: Take 150 mg by mouth daily. After breakfast to after lunch 10/18/20   Dohmeier, Asencion Partridge, MD  cetirizine (ZYRTEC) 10 MG tablet Take 10 mg by mouth daily as needed for allergies.     [provider]  Cholecalciferol (VITAMIN D3) 50 MCG (2000 UT) TABS Take 2,000 Units by mouth  in the morning and at bedtime. (After breakfast & after lunch)    [provider]  clotrimazole (LOTRIMIN) 1 % external solution Apply 1 Application topically 2 (two) times daily.    [provider]  corticotropin (ACTHAR) 80 UNIT/ML injectable gel Inject 1 mL (80 Units total) into the skin every other day for 10 days. May give additional doses PRN. Patient taking differently: Inject 80 Units into the skin as needed (for a boost fives times a month). Husband will bring in medication administer it in front of the nurse 05/16/22 07/15/23  Dohmeier, Asencion Partridge, MD  D-Mannose 500 MG CAPS Take 500 mg by mouth every morning.    [provider]  Dermatological Products, Misc. [facility] EX) Apply 1 Application topically every morning. Apply to affected areas of groin and inguinal creases    [provider]  diclofenac Sodium (VOLTAREN) 1 % GEL Apply 2 g topically 3 (three) times daily as needed. Patient taking differently: Apply 2 g topically 3 (three) times daily as needed (pain (applied to bilateral shoulders)). 12/08/20   Mcarthur Rossetti, MD  diphenhydrAMINE (BENADRYL) 25 mg capsule Take 25 mg by mouth every 8 (eight) hours as needed for itching (number of times per day unspecified on MAR).    [provider]  ELIQUIS 5 MG TABS tablet Take 5 mg by mouth 2 (two) times daily. 05/23/22   [provider]  Emollient (  CERAVE MOISTURIZING) CREA Apply 1 Application topically in the morning.    [provider]  escitalopram (LEXAPRO) 10 MG tablet Take 1 tablet (10 mg total) by mouth daily. Patient taking differently: Take 10 mg by mouth at bedtime. 10/18/20   Dohmeier, Asencion Partridge, MD  famotidine (PEPCID) 20 MG tablet Take 20 mg by mouth at bedtime.    [provider]  gabapentin (NEURONTIN) 300 MG capsule Mrs. Sereno will take 300 mg of gabapentin at 6 AM, 10 AM, 2 PM and 6 PM and 600 mg at 10 PM by mouth. Patient taking differently: Take 300 mg by  mouth See admin instructions. Give 1 capsule by mouth at 0600, 1000, 1800, and 2200 03/07/21   Dohmeier, Asencion Partridge, MD  guaiFENesin (ROBITUSSIN) 100 MG/5ML liquid Take 5 mLs by mouth every 6 (six) hours as needed for cough or to loosen phlegm.    [provider]  lactulose (CHRONULAC) 10 GM/15ML solution Take 15 mLs (10 g total) by mouth 2 (two) times daily as needed for mild constipation. Patient taking differently: Take 10 g by mouth 2 (two) times daily. 07/17/22   Ghimire, Henreitta Leber, MD  levothyroxine (SYNTHROID, LEVOTHROID) 75 MCG tablet Take 75 mcg by mouth daily before breakfast. (0500)    [provider]  melatonin 5 MG TABS Take 5 mg by mouth at bedtime as needed.    [provider]  metolazone (ZAROXOLYN) 2.5 MG tablet Take 2.5 mg by mouth daily as needed (for weight gain greater than 3 lbs in 2 days with swelling). 09/09/21   [provider]  metoprolol succinate (TOPROL-XL) 25 MG 24 hr tablet Take 0.5 tablets (12.5 mg total) by mouth daily. Take with or immediately following a meal. 07/17/22 07/12/23  Ghimire, Henreitta Leber, MD  midodrine (PROAMATINE) 2.5 MG tablet Take 3 tablets (7.5 mg total) by mouth 3 (three) times daily with meals. Patient taking differently: Take 7.5 mg by mouth 3 (three) times daily as needed (with meals for sbp <100). 07/17/22   Ghimire, Henreitta Leber, MD  Multiple Vitamin (MULTIVITAMIN WITH MINERALS) TABS tablet Take 1 tablet by mouth daily after lunch.    [provider]  NON FORMULARY ASV breathing machine is to be used while napping & at bedtime. Fill canister up to max line with distilled water and turn concentrator to 5LPM.    [provider]  omeprazole (PRILOSEC) 40 MG capsule Take 1 capsule (40 mg total) by mouth every morning. 11/16/21   Levin Erp, PA  ondansetron (ZOFRAN-ODT) 4 MG disintegrating tablet Take 4 mg by mouth in the morning, at noon, in the evening, and at bedtime. 06/01/22   [provider]  oxybutynin (DITROPAN) 5 MG tablet Take 5 mg by mouth daily as needed for bladder spasms (overactive bladder). 02/09/20   [provider]  phenazopyridine (PYRIDIUM) 100 MG tablet Take 100 mg by mouth 3 (three) times daily as needed for pain. 08/15/21   [provider]  phenylephrine-shark liver oil-mineral oil-petrolatum (PREPARATION H) 0.25-14-74.9 % rectal ointment Place 1 Application rectally every 6 (six) hours as needed for hemorrhoids. 05/19/22   [provider]  potassium chloride SA (KLOR-CON) 20 MEQ tablet Take 2 tablets (40 mEq total) by mouth daily. Patient taking differently: Take 20 mEq by mouth 2 (two) times daily. (After lunch & after dinner) 01/03/21   Hilty, Nadean Corwin, MD  Prucalopride Succinate (MOTEGRITY) 2 MG TABS Take 1 tablet by mouth every morning.    [provider]  psyllium (METAMUCIL SMOOTH TEXTURE) 28 % packet Take 1 packet by mouth 2 (two) times daily.    [provider]  senna-docusate (SENOKOT-S) 8.6-50 MG tablet Take 2 tablets by mouth at bedtime. Patient taking differently: Take 2 tablets by mouth 2 (two) times daily. 07/17/22   Ghimire, Henreitta Leber, MD  simethicone (MYLICON) 80 MG chewable tablet Chew 160 mg by mouth 3 (three) times daily as needed for flatulence (indigestion).    [provider]  sodium phosphate (FLEET) 7-19 GM/118ML ENEM Place 1 enema rectally daily as needed for severe constipation.    [provider]  torsemide (DEMADEX) 20 MG tablet Take 2 tablets (40 mg total) by mouth daily. Patient taking differently: Take 20 mg by mouth as directed. Take 1.5 tabs (30 mg) in am and 1 tab (20 MG) in evening 07/17/22   Ghimire, Henreitta Leber, MD  zinc oxide 20 % ointment Apply 1 application topically as needed for irritation (apply labia every shift as needed for redness).    [provider]      Allergies    Other, Chlorhexidine, Hydrocodone, Oxycodone, Sertraline, Macrobid  [nitrofurantoin], Phenol, and Rofecoxib    Review of Systems   Review of Systems  Constitutional:  Positive for fever.  HENT:  Negative for sore throat.   Eyes:  Negative for redness.  Respiratory:  Negative for cough and shortness of breath.   Cardiovascular:  Negative for chest pain.  Gastrointestinal:  Negative for abdominal pain, diarrhea, nausea and vomiting.  Genitourinary:  Negative for dysuria and flank pain.  Musculoskeletal:  Negative for back pain, neck pain and neck stiffness.  Skin:  Negative for rash.  Neurological:  Negative for headaches.  Psychiatric/Behavioral:  Negative for confusion.     Physical Exam Updated Vital Signs BP 118/62   Pulse (!) 109   Temp 98.9 F (37.2 C) (Oral)   Resp 11   SpO2 100%  Physical Exam Vitals and nursing note reviewed.  Constitutional:      Appearance: Normal appearance. She is well-developed.  HENT:     Head: Atraumatic.     Nose: Nose normal.     Mouth/Throat:     Mouth: Mucous membranes are moist.     Pharynx: Oropharynx is clear.  Eyes:     General: No scleral icterus.    Conjunctiva/sclera: Conjunctivae normal.     Pupils: Pupils are equal, round, and reactive to light.  Neck:     Trachea: No tracheal deviation.     Comments: No stiffness or rigidity.  Cardiovascular:     Rate and Rhythm: Tachycardia present. Rhythm irregular.     Pulses: Normal pulses.     Heart sounds: Normal heart sounds. No murmur heard.    No friction rub. No gallop.  Pulmonary:     Effort: Pulmonary effort is normal. No respiratory distress.     Breath sounds: Normal breath sounds.  Abdominal:     General: Bowel sounds are normal. There is no distension.     Palpations: Abdomen is soft.     Tenderness: There is no abdominal tenderness.  Genitourinary:    Comments: No cva tenderness.  Musculoskeletal:        General: No swelling.     Cervical back: Normal range of motion and neck supple. No rigidity. No muscular tenderness.  Skin:     General: Skin is warm and dry.     Findings: No rash.  Neurological:     Mental Status: She is alert.  Comments: Alert, speech normal.   Psychiatric:        Mood and Affect: Mood normal.     ED Results / Procedures / Treatments   Labs (all labs ordered are listed, but only abnormal results are displayed) Results for orders placed or performed during the hospital encounter of 11/20/22  Resp panel by RT-PCR (RSV, Flu A&B, Covid) Anterior Nasal Swab   Specimen: Anterior Nasal Swab  Result Value Ref Range   SARS Coronavirus 2 by RT PCR NEGATIVE NEGATIVE   Influenza A by PCR NEGATIVE NEGATIVE   Influenza B by PCR NEGATIVE NEGATIVE   Resp Syncytial Virus by PCR NEGATIVE NEGATIVE  Comprehensive metabolic panel  Result Value Ref Range   Sodium 122 (L) 135 - 145 mmol/L   Potassium 3.2 (L) 3.5 - 5.1 mmol/L   Chloride 78 (L) 98 - 111 mmol/L   CO2 34 (H) 22 - 32 mmol/L   Glucose, Bld 113 (H) 70 - 99 mg/dL   BUN 23 8 - 23 mg/dL   Creatinine, Ser 0.99 0.44 - 1.00 mg/dL   Calcium 7.9 (L) 8.9 - 10.3 mg/dL   Total Protein 5.5 (L) 6.5 - 8.1 g/dL   Albumin 2.8 (L) 3.5 - 5.0 g/dL   AST 16 15 - 41 U/L   ALT 8 0 - 44 U/L   Alkaline Phosphatase 60 38 - 126 U/L   Total Bilirubin 1.0 0.3 - 1.2 mg/dL   GFR, Estimated 59 (L) >60 mL/min   Anion gap 10 5 - 15  CBC  Result Value Ref Range   WBC 13.3 (H) 4.0 - 10.5 K/uL   RBC 3.82 (L) 3.87 - 5.11 MIL/uL   Hemoglobin 11.8 (L) 12.0 - 15.0 g/dL   HCT 34.7 (L) 36.0 - 46.0 %   MCV 90.8 80.0 - 100.0 fL   MCH 30.9 26.0 - 34.0 pg   MCHC 34.0 30.0 - 36.0 g/dL   RDW 13.1 11.5 - 15.5 %   Platelets 162 150 - 400 K/uL   nRBC 0.0 0.0 - 0.2 %  Urinalysis, Routine w reflex microscopic -Urine, Catheterized  Result Value Ref Range   Color, Urine YELLOW YELLOW   APPearance CLOUDY (A) CLEAR   Specific Gravity, Urine 1.004 (L) 1.005 - 1.030   pH 6.0 5.0 - 8.0   Glucose, UA NEGATIVE NEGATIVE mg/dL   Hgb urine dipstick MODERATE (A) NEGATIVE   Bilirubin Urine  NEGATIVE NEGATIVE   Ketones, ur NEGATIVE NEGATIVE mg/dL   Protein, ur NEGATIVE NEGATIVE mg/dL   Nitrite NEGATIVE NEGATIVE   Leukocytes,Ua LARGE (A) NEGATIVE   RBC / HPF 6-10 0 - 5 RBC/hpf   WBC, UA >50 0 - 5 WBC/hpf   Bacteria, UA MANY (A) NONE SEEN   Squamous Epithelial / HPF 0-5 0 - 5 /HPF   WBC Clumps PRESENT   Lactic acid, plasma  Result Value Ref Range   Lactic Acid, Venous 1.1 0.5 - 1.9 mmol/L  Magnesium  Result Value Ref Range   Magnesium 1.9 1.7 - 2.4 mg/dL   *Note: Due to a large number of results and/or encounters for the requested time period, some results have not been displayed. A complete set of results can be found in Results Review.      EKG EKG Interpretation  Date/Time:  Monday November 20 2022 11:50:52 EDT Ventricular Rate:  122 PR Interval:    QRS Duration: 104 QT Interval:  342 QTC Calculation: 488 R Axis:   6 Text Interpretation: Atrial fibrillation  Borderline prolonged QT interval Confirmed by Lajean Saver [phone]) on 11/20/2022 12:40:29 PM  Radiology DG Chest Port 1 View  Result Date: 11/20/2022 CLINICAL DATA:  Fever. EXAM: PORTABLE CHEST 1 VIEW COMPARISON:  07/13/2022. FINDINGS: Unchanged cardiomegaly. Stable mediastinal contours. Pulmonary venous congestion. No focal airspace opacity. No pleural effusion or pneumothorax. IMPRESSION: Pulmonary venous congestion without overt pulmonary edema. No consolidation. Electronically Signed   By: Emmit Alexanders M.D.   On: 11/20/2022 12:42    Procedures Procedures    Medications Ordered in ED Medications  cefTRIAXone (ROCEPHIN) 1 g in sodium chloride 0.9 % 100 mL IVPB (1 g Intravenous New Bag/Given 11/20/22 1633)  lactated ringers bolus 1,000 mL (1,000 mLs Intravenous New Bag/Given 11/20/22 1159)  sodium chloride 0.9 % bolus 1,000 mL (1,000 mLs Intravenous New Bag/Given 11/20/22 1438)  potassium chloride SA (KLOR-CON M) CR tablet 40 mEq (40 mEq Oral Given 11/20/22 1437)    ED Course/ Medical Decision Making/ A&P                              Medical Decision Making Problems Addressed: Dehydration: acute illness or injury with systemic symptoms that poses a threat to life or bodily functions Generalized weakness: acute illness or injury with systemic symptoms that poses a threat to life or bodily functions Hypokalemia: acute illness or injury Hyponatremia: acute illness or injury with systemic symptoms that poses a threat to life or bodily functions Sepsis due to urinary tract infection: acute illness or injury with systemic symptoms that poses a threat to life or bodily functions  Amount and/or Complexity of Data Reviewed Independent Historian: EMS External Data Reviewed: notes. Labs: ordered. Decision-making details documented in ED Course. Radiology: ordered and independent interpretation performed. Decision-making details documented in ED Course. ECG/medicine tests: ordered and independent interpretation performed. Decision-making details documented in ED Course.  Risk Prescription drug management. Decision regarding hospitalization.   Iv ns. Continuous pulse ox and cardiac monitoring. Labs ordered/sent. Imaging ordered.   Differential diagnosis includes sepsis, uti, pna, etc . Dispo decision including potential need for admission considered - will get labs and imaging and reassess.   Reviewed nursing notes and prior charts for additional history. External reports reviewed. Additional history from: EMS.  Cardiac monitor: afib, rate 110. Cultures. Ns bolus.   Labs reviewed/interpreted by me - lactate normal. Ua c/w uti.  Na low.   Xrays reviewed/interpreted by me - no pna.  Blood and urine cultures sent. Rocephin iv. Ns bolus.   Medicine consulted for admission.  1640, admitting service call back pending - signed out to Dr Sherry Ruffing to field call, and facilitate admission.  CRITICAL CARE RE; uropsesis/sepsis, hyponatremia,  Performed by: Mirna Mires Total critical care time: 40  minutes Critical care time was exclusive of separately billable procedures and treating other patients. Critical care was necessary to treat or prevent imminent or life-threatening deterioration. Critical care was time spent personally by me on the following activities: development of treatment plan with patient and/or surrogate as well as nursing, discussions with consultants, evaluation of patient's response to treatment, examination of patient, obtaining history from patient or surrogate, ordering and performing treatments and interventions, ordering and review of laboratory studies, ordering and review of radiographic studies, pulse oximetry and re-evaluation of patient's condition.          Final Clinical Impression(s) / ED Diagnoses Final diagnoses:  Sepsis due to urinary tract infection  Hyponatremia  Hypokalemia  Generalized weakness  Dehydration    Rx / DC Orders ED Discharge Orders     None         Lajean Saver, MD 11/20/22 1642

## 2022-11-21 DIAGNOSIS — G35 Multiple sclerosis: Secondary | ICD-10-CM | POA: Diagnosis present

## 2022-11-21 DIAGNOSIS — I110 Hypertensive heart disease with heart failure: Secondary | ICD-10-CM | POA: Diagnosis present

## 2022-11-21 DIAGNOSIS — E860 Dehydration: Secondary | ICD-10-CM | POA: Diagnosis present

## 2022-11-21 DIAGNOSIS — E876 Hypokalemia: Secondary | ICD-10-CM | POA: Diagnosis present

## 2022-11-21 DIAGNOSIS — E249 Cushing's syndrome, unspecified: Secondary | ICD-10-CM | POA: Diagnosis present

## 2022-11-21 DIAGNOSIS — N390 Urinary tract infection, site not specified: Secondary | ICD-10-CM | POA: Diagnosis not present

## 2022-11-21 DIAGNOSIS — L8990 Pressure ulcer of unspecified site, unspecified stage: Secondary | ICD-10-CM | POA: Insufficient documentation

## 2022-11-21 DIAGNOSIS — E039 Hypothyroidism, unspecified: Secondary | ICD-10-CM | POA: Diagnosis present

## 2022-11-21 DIAGNOSIS — E871 Hypo-osmolality and hyponatremia: Secondary | ICD-10-CM | POA: Diagnosis present

## 2022-11-21 DIAGNOSIS — F32A Depression, unspecified: Secondary | ICD-10-CM | POA: Diagnosis present

## 2022-11-21 DIAGNOSIS — E785 Hyperlipidemia, unspecified: Secondary | ICD-10-CM | POA: Diagnosis present

## 2022-11-21 DIAGNOSIS — L89151 Pressure ulcer of sacral region, stage 1: Secondary | ICD-10-CM | POA: Diagnosis present

## 2022-11-21 DIAGNOSIS — Z79899 Other long term (current) drug therapy: Secondary | ICD-10-CM | POA: Diagnosis not present

## 2022-11-21 DIAGNOSIS — Z8616 Personal history of COVID-19: Secondary | ICD-10-CM | POA: Diagnosis not present

## 2022-11-21 DIAGNOSIS — N318 Other neuromuscular dysfunction of bladder: Secondary | ICD-10-CM | POA: Diagnosis present

## 2022-11-21 DIAGNOSIS — Z87891 Personal history of nicotine dependence: Secondary | ICD-10-CM | POA: Diagnosis not present

## 2022-11-21 DIAGNOSIS — Z1612 Extended spectrum beta lactamase (ESBL) resistance: Secondary | ICD-10-CM | POA: Diagnosis present

## 2022-11-21 DIAGNOSIS — Z1152 Encounter for screening for COVID-19: Secondary | ICD-10-CM | POA: Diagnosis not present

## 2022-11-21 DIAGNOSIS — I4821 Permanent atrial fibrillation: Secondary | ICD-10-CM | POA: Diagnosis present

## 2022-11-21 DIAGNOSIS — I959 Hypotension, unspecified: Secondary | ICD-10-CM | POA: Diagnosis present

## 2022-11-21 DIAGNOSIS — E662 Morbid (severe) obesity with alveolar hypoventilation: Secondary | ICD-10-CM | POA: Diagnosis present

## 2022-11-21 DIAGNOSIS — F419 Anxiety disorder, unspecified: Secondary | ICD-10-CM | POA: Diagnosis present

## 2022-11-21 DIAGNOSIS — N136 Pyonephrosis: Secondary | ICD-10-CM | POA: Diagnosis present

## 2022-11-21 DIAGNOSIS — I5042 Chronic combined systolic (congestive) and diastolic (congestive) heart failure: Secondary | ICD-10-CM | POA: Diagnosis present

## 2022-11-21 DIAGNOSIS — A419 Sepsis, unspecified organism: Secondary | ICD-10-CM | POA: Diagnosis not present

## 2022-11-21 DIAGNOSIS — Z79890 Hormone replacement therapy (postmenopausal): Secondary | ICD-10-CM | POA: Diagnosis not present

## 2022-11-21 LAB — BASIC METABOLIC PANEL
Anion gap: 11 (ref 5–15)
Anion gap: 13 (ref 5–15)
Anion gap: 10 (ref 5–15)
BUN: 17 mg/dL (ref 8–23)
BUN: 18 mg/dL (ref 8–23)
BUN: 16 mg/dL (ref 8–23)
CO2: 31 mmol/L (ref 22–32)
CO2: 28 mmol/L (ref 22–32)
CO2: 33 mmol/L — ABNORMAL HIGH (ref 22–32)
Calcium: 8 mg/dL — ABNORMAL LOW (ref 8.9–10.3)
Calcium: 8.3 mg/dL — ABNORMAL LOW (ref 8.9–10.3)
Calcium: 8.4 mg/dL — ABNORMAL LOW (ref 8.9–10.3)
Chloride: 88 mmol/L — ABNORMAL LOW (ref 98–111)
Chloride: 86 mmol/L — ABNORMAL LOW (ref 98–111)
Chloride: 85 mmol/L — ABNORMAL LOW (ref 98–111)
Creatinine, Ser: 0.83 mg/dL (ref 0.44–1.00)
Creatinine, Ser: 0.72 mg/dL (ref 0.44–1.00)
Creatinine, Ser: 0.72 mg/dL (ref 0.44–1.00)
GFR, Estimated: >60 mL/min (ref >60)
GFR, Estimated: >60 mL/min (ref >60)
GFR, Estimated: >60 mL/min (ref >60)
Glucose, Bld: 90 mg/dL (ref 70–99)
Glucose, Bld: 84 mg/dL (ref 70–99)
Glucose, Bld: 121 mg/dL — ABNORMAL HIGH (ref 70–99)
Potassium: 3.5 mmol/L (ref 3.5–5.1)
Potassium: 4 mmol/L (ref 3.5–5.1)
Potassium: 3.5 mmol/L (ref 3.5–5.1)
Sodium: 130 mmol/L — ABNORMAL LOW (ref 135–145)
Sodium: 127 mmol/L — ABNORMAL LOW (ref 135–145)
Sodium: 128 mmol/L — ABNORMAL LOW (ref 135–145)

## 2022-11-21 LAB — CBC
HCT: 36.1 % (ref 36.0–46.0)
Hemoglobin: 12.2 g/dL (ref 12.0–15.0)
MCH: 30.7 pg (ref 26.0–34.0)
MCHC: 33.8 g/dL (ref 30.0–36.0)
MCV: 90.7 fL (ref 80.0–100.0)
Platelets: 147 10*3/uL — ABNORMAL LOW (ref 150–400)
RBC: 3.98 MIL/uL (ref 3.87–5.11)
RDW: 13 % (ref 11.5–15.5)
WBC: 11.4 10*3/uL — ABNORMAL HIGH (ref 4.0–10.5)
nRBC: 0 % (ref 0.0–0.2)

## 2022-11-21 LAB — TSH: TSH: 1.111 u[IU]/mL (ref 0.350–4.500)

## 2022-11-21 MED ORDER — LORATADINE 10 MG PO TABS
10.0000 mg | ORAL_TABLET | Freq: Every day | ORAL | Status: DC
  Administered 2022-11-21 – 2022-11-24 (×4): 10 mg via ORAL
  Filled 2022-11-21 (×4): qty 1

## 2022-11-21 MED ORDER — MIDODRINE HCL 5 MG PO TABS
10.0000 mg | ORAL_TABLET | Freq: Three times a day (TID) | ORAL | Status: DC
  Administered 2022-11-21 – 2022-11-24 (×13): 10 mg via ORAL
  Filled 2022-11-21 (×13): qty 2

## 2022-11-21 MED ORDER — POTASSIUM CHLORIDE CRYS ER 20 MEQ PO TBCR
40.0000 meq | EXTENDED_RELEASE_TABLET | Freq: Once | ORAL | Status: AC
  Administered 2022-11-21: 40 meq via ORAL
  Filled 2022-11-21: qty 2

## 2022-11-21 MED ORDER — LACTULOSE 10 GM/15ML PO SOLN
10.0000 g | Freq: Two times a day (BID) | ORAL | Status: DC | PRN
  Administered 2022-11-21 (×2): 10 g via ORAL
  Filled 2022-11-21 (×3): qty 15

## 2022-11-21 MED ORDER — SODIUM CHLORIDE 0.9 % IV SOLN: INTRAVENOUS | Status: DC

## 2022-11-21 MED ORDER — SODIUM CHLORIDE 0.9 % IV BOLUS
1000.0000 mL | Freq: Once | INTRAVENOUS | Status: AC
  Administered 2022-11-21: 1000 mL via INTRAVENOUS

## 2022-11-21 NOTE — TOC Initial Note (Signed)
Transition of Care [facility]) - Initial/Assessment Note    Patient Details  Name: Toni [name] MRN: [id number] Date of Birth: 04-Jun-1946  Transition of [facility] CM/SW Contact:    Vinie Sill, LCSW Phone Number: 11/21/2022, 3:46 PM  Clinical Narrative:                  CSW spoke with patient's daughter, [name]. She confirmed patient is from [place] and she expects her to return once medially stable. She will PTAR once medially stable.   TOC will continue to follow and assist with discharge planning.   Thurmond Butts, MSW, LCSW Clinical Social Worker     Expected Discharge Plan: Skilled Nursing Facility Barriers to Discharge: Continued Medical Work up   Patient Goals and CMS Choice            Expected Discharge Plan and Services In-house Referral: Clinical Social Work                                            Prior Living Arrangements/Services   Lives with:: Self          Need for Family Participation in Patient Care: Yes (Comment) Care giver support system in place?: Yes (comment)   Criminal Activity/Legal Involvement Pertinent to Current Situation/Hospitalization: No - Comment as needed  Activities of Daily Living      Permission Sought/Granted                  Emotional Assessment         Alcohol / Substance Use: Not Applicable Psych Involvement: No (comment)  Admission diagnosis:  Dehydration [E86.0] Hypokalemia [E87.6] Hyponatremia [E87.1] Generalized weakness [R53.1] Sepsis secondary to UTI [A41.9, N39.0] Sepsis due to urinary tract infection [A41.9, N39.0] UTI (urinary tract infection) [N39.0] Patient Active Problem List   Diagnosis Date Noted   Pressure injury of skin 11/21/2022   Sepsis secondary to UTI 11/20/2022   (HFpEF) heart failure with preserved ejection fraction 07/14/2022   Hyponatremia 07/13/2022   Cushing's syndrome, ACTH dependent 09/12/2021   Presence of intrathecal pump 08/18/2021    Spasticity 02/18/2021   Bladder spasms 01/04/2021   UTI (urinary tract infection) 01/03/2021   Bilateral carpal tunnel syndrome 10/04/2020   Multiple sclerosis, secondary progressive 09/13/2020   Chronic intermittent hypoxia with obstructive sleep apnea 09/13/2020   COVID-19 long hauler manifesting chronic dyspnea 09/13/2020   Chronic diastolic CHF (congestive heart failure) 09/13/2020   Acute respiratory failure with hypoxia 09/13/2020   Class 2 severe obesity with serious comorbidity and body mass index (BMI) of 35.0 to 35.9 in adult 09/13/2020   Sepsis 06/04/2020   Atrial fibrillation with RVR 04/07/2020   Recurrent UTI [id number]   Uncomplicated degenerative myopia of both eyes 03/19/2020   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/2020   Oxygen dependent 02/26/2020   Neurogenic hypoventilation 02/26/2020   Complex atypical endometrial hyperplasia    Acute on chronic respiratory failure with hypoxia 10/10/2019   Multiple sclerosis 10/10/2019   Bilateral leg weakness 08/04/2019   Weakness 08/04/2019   Unilateral primary osteoarthritis, left knee 01/09/2019   Obstructive sleep apnea treated with bilevel positive airway pressure (BiPAP) 08/15/2018   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 11/21/2017   Permanent atrial fibrillation 11/21/2017   Tightness of leg fascia 11/21/2017   Muscle spasm of both lower legs 11/21/2017   Carpal tunnel syndrome, left  upper limb 09/13/2017   Carpal tunnel syndrome, right upper limb 09/13/2017   Other insomnia 06/11/2017   Depression with anxiety 06/11/2017   Vitamin D deficiency 03/22/2017   Right hand weakness 08/06/2016   Nonischemic cardiomyopathy 05/09/2016   Localized edema 05/09/2016   Long term current use of anticoagulant therapy 02/03/2016   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   Obesity hypoventilation syndrome ([place]) 03/30/2015   Abnormality of gait 03/01/2015   Chronic constipation with  overflow incontinence 01/25/2015   ACTH dependent Cushing's syndrome ([place]) 01/25/2015   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) ([place]) 01/25/2015   Neurogenic urinary bladder disorder 08/24/2014   Varicose veins of both lower extremities with complications 0000000   Pseudophakia of both eyes 02/07/2012   Multiple sclerosis, 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 11/26/2011   Hypothyroidism    HLD (hyperlipidemia)    PCP:  Jodi Marble, MD Pharmacy:   [facility], [place] - [address] [place] [address] Phone: [phone] Fax: [phone]     Social Determinants of Health ([place]) Social History: SDOH Screenings   Food Insecurity: No Food Insecurity (07/14/2022)  Housing: Low Risk  (07/15/2022)  Transportation Needs: No Transportation Needs (07/15/2022)  Utilities: Not At Risk (07/15/2022)  Depression (PHQ2-9): Low Risk  (08/06/2018)  Tobacco Use: Medium Risk (11/20/2022)   SDOH Interventions:     Readmission Risk Interventions    06/11/2020    8:32 AM  Readmission Risk Prevention Plan  Transportation Screening Complete  PCP or Specialist appointment within 3-5 days of discharge Not Complete  PCP/Specialist Appt Not Complete comments MD office reports they have to call the pt and schedule this appt themselves when they receive notice of [place] or [place] Complete  SW Recovery Care/Counseling Consult Complete  Palliative Care Screening Not [place] Not Applicable

## 2022-11-21 NOTE — Progress Notes (Signed)
PROGRESS NOTE    Toni [name]  [id number] DOB: 1946-08-11 DOA: 11/20/2022 PCP: Jodi Marble, MD   Brief Narrative:    CLO ALDI is a 77 y.o. 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  She was also noted to have dysuria and had signs of UTI and was admitted with sepsis, present on admission secondary to UTI along with some electrolyte abnormalities.  Assessment & Plan:   Principal Problem:   Sepsis secondary to UTI Active Problems:   Permanent atrial fibrillation   Hypothyroidism   HLD (hyperlipidemia)   Neurogenic urinary bladder disorder   Obesity hypoventilation syndrome (HCC)   Depression with anxiety   Obstructive sleep apnea treated with bilevel positive airway pressure (BiPAP)   Multiple sclerosis   Neurogenic hypoventilation   Chronic diastolic CHF (congestive heart failure)   Class 2 severe obesity with serious comorbidity and body mass index (BMI) of 35.0 to 35.9 in adult   Cushing's syndrome, ACTH dependent  Assessment and Plan:  UTI Early sepsis > Patient presenting with fevers and dysuria.  Urinalysis with hemoglobin, leukocytes, bacteria.  Urine cultures pending.  Leukocytosis to 13.3.  In addition to this leukocytosis with urinary source patient noted to have tachycardia which does meet criteria for sepsis. > Started on ceftriaxone in the ED and received a liter of fluids. - Monitor on progressive unit due to concurrent issues as below - Continue ceftriaxone with Ucx pending - Trend fever curve and WBC   Hyponatremia Hypokalemia-resolved > On presentation patient noted to have sodium of 122 and potassium 3.2. > Magnesium normal.  Received 40 mill equivalents p.o. potassium in the ED. >  Due to worsening edema and reportedly elevated BNP did have her torsemide dose doubled for several days and this past week. > Has received a liter of IV fluids in the ED. - Trend renal function and electrolytes - Hold off on further IV fluids for now pending repeat labs, gentle IV fluids if any, recent volume overload. - Hold diuretic - Will add on OSM, Ur OSM, Ur Na -Check TSH   Multiple sclerosis Spasticity Neurogenic bladder Neurogenic hypoventilation > Patient with known history of MS with spasticity, neurogenic bladder and neurogenic hypoventilation.  Currently on baclofen pump for spasticity. - Continue with home baclofen pump - Treatment of hypoventilation as below   OSA OHS Obesity - Continue home BiPAP nightly -BMI 37.14   Chronic combined diastolic and systolic CHF > Last echo was in 2023 with EF 45-50%, unable to determine diastolic function, normal RV function. - Holding torsemide in the setting of hyponatremia as above for now; appears that she may have been aggressively diuresed over the last few days - Continue home metoprolol   ACTH dependent Cushing's syndrome - Continue home corticotropin   Atrial fibrillation - Continue home metoprolol - Home Eliquis   Hypothyroidism - Continue home Synthroid   Depression - Continue home bupropion, escitalopram - Continue home Xanax    DVT prophylaxis:Eliquis Code Status: Full Family Communication: None at bedside Disposition Plan: Continue treatment for UTI and follow electrolytes Status is: Observation The patient will require care spanning > 2 midnights and should be moved to inpatient because: Need for IV medications.   Skin Assessment:  I have examined the patient's skin and I agree with the wound assessment as performed by the wound  care RN as outlined below:  Pressure Injury 11/20/22 Sacrum Left;Lower Stage 1 -  Intact skin with non-blanchable redness of a localized area usually over a bony prominence.  (Active)  11/20/22 1845  Location: Sacrum  Location Orientation: Left;Lower  Staging: Stage 1 -  Intact skin with non-blanchable redness of a localized area usually over a bony prominence.  Wound Description (Comments):   Present on Admission: Yes  Dressing Type Foam - Lift dressing to assess site every shift 11/20/22 1950     Pressure Injury 11/20/22 Buttocks Right;Lower Stage 2 -  Partial thickness loss of dermis presenting as a shallow open injury with a red, pink wound bed without slough. (Active)  11/20/22 1845  Location: Buttocks  Location Orientation: Right;Lower  Staging: Stage 2 -  Partial thickness loss of dermis presenting as a shallow open injury with a red, pink wound bed without slough.  Wound Description (Comments):   Present on Admission:   Dressing Type Foam - Lift dressing to assess site every shift 11/20/22 1950    Consultants:  None  Procedures:  None  Antimicrobials:  Anti-infectives (From admission, onward)    Start     Dose/Rate Route Frequency Ordered Stop   11/21/22 1000  cefTRIAXone (ROCEPHIN) 1 g in sodium chloride 0.9 % 100 mL IVPB        1 g 200 mL/hr over 30 Minutes Intravenous Every 24 hours 11/20/22 1720     11/20/22 1615  cefTRIAXone (ROCEPHIN) 1 g in sodium chloride 0.9 % 100 mL IVPB        1 g 200 mL/hr over 30 Minutes Intravenous  Once 11/20/22 1600 11/20/22 1707      Subjective: Patient seen and evaluated today with no new acute complaints or concerns. No acute concerns or events noted overnight.  She continues to have some ongoing dysuria.  Denies any shortness of breath or chest pain.  Objective: Vitals:   11/21/22 0300 11/21/22 0400 11/21/22 0520 11/21/22 0634  BP: 97/60 92/64  (!) 94/59  Pulse: 76 94  94  Resp: 15 14 20 18   Temp:  98.9 F (37.2 C)    TempSrc:  Oral    SpO2: 100% 99%  100%  Weight:   95.1 kg     Intake/Output Summary (Last 24 hours) at 11/21/2022 0743 Last data filed at 11/21/2022 0658 Gross per 24 hour   Intake 2941.67 ml  Output 1300 ml  Net 1641.67 ml   Filed Weights   11/20/22 1903 11/21/22 0520  Weight: 90.7 kg 95.1 kg    Examination:  General exam: Appears calm and comfortable  Respiratory system: Clear to auscultation. Respiratory effort normal. Cardiovascular system: S1 & S2 heard, RRR.  Gastrointestinal system: Abdomen is soft Central nervous system: Alert and awake Extremities: No edema Skin: No significant lesions noted Psychiatry: Flat affect.    Data Reviewed: I have personally reviewed following labs and imaging studies  CBC: Recent Labs  Lab 11/20/22 1156 11/21/22 0331  WBC 13.3* 11.4*  HGB 11.8* 12.2  HCT 34.7* 36.1  MCV 90.8 90.7  PLT 162 [id number]*   Basic Metabolic Panel: Recent Labs  Lab 11/20/22 1156 11/20/22 1858 11/20/22 2111 11/21/22 0331 11/21/22 0606  NA 122* 128* 128* 130* 127*  K 3.2* 3.2* 3.4* 3.5 4.0  CL 78* 83* 83* 88* 86*  CO2 34* 35* 34* 31 28  GLUCOSE 113* 90 149* 90 84  BUN 23 17 18 17 18   CREATININE 0.99 0.83 0.94 0.83 0.72  CALCIUM  7.9* 8.2* 8.2* 8.0* 8.3*  MG 1.9  --   --   --   --    GFR: Estimated Creatinine Clearance: 66.7 mL/min (by C-G formula based on SCr of 0.72 mg/dL). Liver Function Tests: Recent Labs  Lab 11/20/22 1156  AST 16  ALT 8  ALKPHOS 60  BILITOT 1.0  PROT 5.5*  ALBUMIN 2.8*   No results for input(s): "LIPASE", "AMYLASE" in the last 168 hours. No results for input(s): "AMMONIA" in the last 168 hours. Coagulation Profile: No results for input(s): "INR", "PROTIME" in the last 168 hours. Cardiac Enzymes: No results for input(s): "CKTOTAL", "CKMB", "CKMBINDEX", "TROPONINI" in the last 168 hours. BNP (last 3 results) No results for input(s): "PROBNP" in the last 8760 hours. HbA1C: No results for input(s): "HGBA1C" in the last 72 hours. CBG: No results for input(s): "GLUCAP" in the last 168 hours. Lipid Profile: No results for input(s): "CHOL", "HDL", "LDLCALC", "TRIG", "CHOLHDL", "LDLDIRECT"  in the last 72 hours. Thyroid Function Tests: No results for input(s): "TSH", "T4TOTAL", "FREET4", "T3FREE", "THYROIDAB" in the last 72 hours. Anemia Panel: No results for input(s): "VITAMINB12", "FOLATE", "FERRITIN", "TIBC", "IRON", "RETICCTPCT" in the last 72 hours. Sepsis Labs: Recent Labs  Lab 11/20/22 1149  LATICACIDVEN 1.1    Recent Results (from the past 240 hour(s))  Resp panel by RT-PCR (RSV, Flu A&B, Covid) Anterior Nasal Swab     Status: None   Collection Time: 11/20/22 11:53 AM   Specimen: Anterior Nasal Swab  Result Value Ref Range Status   SARS Coronavirus 2 by RT PCR NEGATIVE NEGATIVE Final   Influenza A by PCR NEGATIVE NEGATIVE Final   Influenza B by PCR NEGATIVE NEGATIVE Final    Comment: (NOTE) The Xpert Xpress SARS-CoV-2/FLU/RSV plus assay is intended as an aid in the diagnosis of influenza from Nasopharyngeal swab specimens and should not be used as a sole basis for treatment. Nasal washings and aspirates are unacceptable for Xpert Xpress SARS-CoV-2/FLU/RSV testing.  Fact Sheet for Patients: EntrepreneurPulse.com.au  Fact Sheet for Healthcare Providers: IncredibleEmployment.be  This test is not yet approved or cleared by the [place] FDA and has been authorized for detection and/or diagnosis of SARS-CoV-2 by FDA under an Emergency Use Authorization (EUA). This EUA will remain in effect (meaning this test can be used) for the duration of the COVID-19 declaration under Section 564(b)(1) of the Act, 21 U.S.C. section 360bbb-3(b)(1), unless the authorization is terminated or revoked.     Resp Syncytial Virus by PCR NEGATIVE NEGATIVE Final    Comment: (NOTE) Fact Sheet for Patients: EntrepreneurPulse.com.au  Fact Sheet for Healthcare Providers: IncredibleEmployment.be  This test is not yet approved or cleared by the [place] FDA and has been authorized for detection and/or  diagnosis of SARS-CoV-2 by FDA under an Emergency Use Authorization (EUA). This EUA will remain in effect (meaning this test can be used) for the duration of the COVID-19 declaration under Section 564(b)(1) of the Act, 21 U.S.C. section 360bbb-3(b)(1), unless the authorization is terminated or revoked.  Performed at [facility], [place] [address], [place], [place] [address]   Urine Culture     Status: None (Preliminary result)   Collection Time: 11/20/22  1:15 PM   Specimen: Urine, Catheterized  Result Value Ref Range Status   Specimen Description URINE, CATHETERIZED  Final   Special Requests   Final    NONE Performed at [facility], [place] [address] [place], [place] [address]    Culture PENDING  Incomplete  Report Status PENDING  Incomplete         Radiology Studies: DG Chest Port 1 View  Result Date: 11/20/2022 CLINICAL DATA:  Fever. EXAM: PORTABLE CHEST 1 VIEW COMPARISON:  07/13/2022. FINDINGS: Unchanged cardiomegaly. Stable mediastinal contours. Pulmonary venous congestion. No focal airspace opacity. No pleural effusion or pneumothorax. IMPRESSION: Pulmonary venous congestion without overt pulmonary edema. No consolidation. Electronically Signed   By: Emmit Alexanders M.D.   On: 11/20/2022 12:42        Scheduled Meds:  ALPRAZolam  0.5 mg Oral QHS   apixaban  5 mg Oral BID   buPROPion  150 mg Oral Daily   escitalopram  10 mg Oral QHS   gabapentin  300 mg Oral 5 X Daily   levothyroxine  75 mcg Oral QAC breakfast   melatonin  5 mg Oral QHS   metoprolol succinate  12.5 mg Oral Daily   midodrine  10 mg Oral TID WC   pantoprazole  40 mg Oral Daily   polyvinyl alcohol  1 drop Both Eyes BID   sodium chloride flush  3 mL Intravenous Q12H   Continuous Infusions:  sodium chloride 100 mL/hr at 11/21/22 0658   cefTRIAXone (ROCEPHIN)  IV       LOS: 0 days    Time spent: 35 minutes    Calais Svehla Darleen Crocker, DO [facility]  If 7PM-7AM, please contact  night-coverage [email or web address] 11/21/2022, 7:43 AM

## 2022-11-21 NOTE — Progress Notes (Signed)
Pt placed oh home cpap with 5l o2 bled in

## 2022-11-22 ENCOUNTER — Inpatient Hospital Stay (HOSPITAL_COMMUNITY): Payer: Medicare Other

## 2022-11-22 DIAGNOSIS — A419 Sepsis, unspecified organism: Secondary | ICD-10-CM | POA: Diagnosis not present

## 2022-11-22 DIAGNOSIS — N390 Urinary tract infection, site not specified: Secondary | ICD-10-CM | POA: Diagnosis not present

## 2022-11-22 LAB — URINE CULTURE: Culture: >=100000 — AB

## 2022-11-22 LAB — CBC
HCT: 32.1 % — ABNORMAL LOW (ref 36.0–46.0)
Hemoglobin: 11.1 g/dL — ABNORMAL LOW (ref 12.0–15.0)
MCH: 31.2 pg (ref 26.0–34.0)
MCHC: 34.6 g/dL (ref 30.0–36.0)
MCV: 90.2 fL (ref 80.0–100.0)
Platelets: 180 10*3/uL (ref 150–400)
RBC: 3.56 MIL/uL — ABNORMAL LOW (ref 3.87–5.11)
RDW: 13.1 % (ref 11.5–15.5)
WBC: 9 10*3/uL (ref 4.0–10.5)
nRBC: 0 % (ref 0.0–0.2)

## 2022-11-22 LAB — BASIC METABOLIC PANEL
Anion gap: 9 (ref 5–15)
BUN: 16 mg/dL (ref 8–23)
CO2: 32 mmol/L (ref 22–32)
Calcium: 8.3 mg/dL — ABNORMAL LOW (ref 8.9–10.3)
Chloride: 89 mmol/L — ABNORMAL LOW (ref 98–111)
Creatinine, Ser: 0.63 mg/dL (ref 0.44–1.00)
GFR, Estimated: >60 mL/min (ref >60)
Glucose, Bld: 85 mg/dL (ref 70–99)
Potassium: 3.6 mmol/L (ref 3.5–5.1)
Sodium: 130 mmol/L — ABNORMAL LOW (ref 135–145)

## 2022-11-22 LAB — BRAIN NATRIURETIC PEPTIDE: B Natriuretic Peptide: 308.6 pg/mL — ABNORMAL HIGH (ref 0.0–100.0)

## 2022-11-22 LAB — MAGNESIUM: Magnesium: 1.9 mg/dL (ref 1.7–2.4)

## 2022-11-22 MED ORDER — POLYETHYLENE GLYCOL 3350 17 G PO PACK
17.0000 g | PACK | Freq: Two times a day (BID) | ORAL | Status: DC
  Administered 2022-11-22 – 2022-11-23 (×3): 17 g via ORAL
  Filled 2022-11-22 (×3): qty 1

## 2022-11-22 MED ORDER — LACTULOSE 10 GM/15ML PO SOLN
10.0000 g | Freq: Two times a day (BID) | ORAL | Status: DC
  Administered 2022-11-22 – 2022-11-24 (×5): 10 g via ORAL
  Filled 2022-11-22 (×4): qty 15

## 2022-11-22 MED ORDER — FUROSEMIDE 10 MG/ML IJ SOLN
20.0000 mg | Freq: Two times a day (BID) | INTRAMUSCULAR | Status: DC
  Administered 2022-11-22 – 2022-11-23 (×2): 20 mg via INTRAVENOUS
  Filled 2022-11-22 (×2): qty 2

## 2022-11-22 MED ORDER — SODIUM CHLORIDE 0.9 % IV SOLN
1.0000 g | Freq: Three times a day (TID) | INTRAVENOUS | Status: DC
  Administered 2022-11-22 – 2022-11-23 (×4): 1 g via INTRAVENOUS
  Filled 2022-11-22 (×4): qty 20

## 2022-11-22 NOTE — Progress Notes (Signed)
Pharmacy Antibiotic Note  Toni [name] is a 77 y.o. female admitted on 11/20/2022 with ESBL Ecoli UTI.  Pharmacy has been consulted for meropenem dosing. -WBC= 9, afebrile  Plan: -Meropenem 1gm IV q8h -Will follow renal function and clinical progress    Weight: 92.6 kg (204 lb 2.3 oz)  Temp (24hrs), Avg:98.5 F (36.9 C), Min:98 F (36.7 C), Max:98.8 F (37.1 C)  Recent Labs  Lab 11/20/22 1149 11/20/22 1156 11/20/22 1858 11/20/22 2111 11/21/22 0331 11/21/22 0606 11/21/22 1006 11/22/22 0104  WBC  --  13.3*  --   --  11.4*  --   --  9.0  CREATININE  --  0.99   < > 0.94 0.83 0.72 0.72 0.63  LATICACIDVEN 1.1  --   --   --   --   --   --   --    < > = values in this interval not displayed.    Estimated Creatinine Clearance: 65.7 mL/min (by C-G formula based on SCr of 0.63 mg/dL).    Allergies  Allergen Reactions   Other Hives and Rash    Topical Surgical prep  - severe rash, hives   Hydrocodone Nausea And Vomiting   Oxycontin [Oxycodone] Nausea And Vomiting   Zoloft [Sertraline] Hives, Swelling and Rash   Chloraseptic [Phenol] Hives    Not documented on MAR   Macrobid [Nitrofurantoin] Nausea And Vomiting    Not documented on MAR   Vioxx [Rofecoxib] Other (See Comments)    Unknown reaction Not documented on MAR   Hibiclens [Chlorhexidine Gluconate] Hives, Rash and Other (See Comments)    Sores    Antimicrobials this admission: 4/1 CTX>> 4/3 4/3 meropenem  Dose adjustments this admission:   Microbiology results: 4/1: ESBL Ecoli  Thank you for allowing pharmacy to be a part of this patient's care.  Hildred Laser, PharmD Clinical Pharmacist **Pharmacist phone directory can now be found on [email or web address] ([email or web address]).  Listed under [place].

## 2022-11-22 NOTE — Progress Notes (Signed)
PROGRESS NOTE    Toni [name]  [id number] DOB: Aug 18, 1946 DOA: 11/20/2022 PCP: Jodi Marble, MD  Chief Complaint  Patient presents with   Code Sepsis    Brief Narrative:   77 y.o. 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  She's been diagnosed with an ESBL UTI.  Assessment & Plan:   Principal Problem:   Sepsis secondary to UTI Active Problems:   UTI (urinary tract infection)   Permanent atrial fibrillation   Hypothyroidism   HLD (hyperlipidemia)   Neurogenic urinary bladder disorder   Obesity hypoventilation syndrome (HCC)   Depression with anxiety   Obstructive sleep apnea treated with bilevel positive airway pressure (BiPAP)   Multiple sclerosis   Neurogenic hypoventilation   Chronic diastolic CHF (congestive heart failure)   Class 2 severe obesity with serious comorbidity and body mass index (BMI) of 35.0 to 35.9 in adult   Cushing's syndrome, ACTH dependent   Pressure injury of skin  ESBL UTI  Concern for L Pyelonephritis Early sepsis > Patient presenting with fevers and dysuria.  Urinalysis with hemoglobin, leukocytes, bacteria.  Urine cultures pending.  Leukocytosis to 13.3.  In addition to this leukocytosis with urinary source patient noted to have tachycardia which does meet criteria for sepsis. - ESBL e. Coli on culture - will continue treatment with meropenem, did have L sided CVA tenderness today, will need to be treated for pyelo  - renal US    Chronic combined diastolic and systolic CHF > Last echo was in 2023 with EF 45-50%, unable to determine diastolic function, normal RV function. - start IV lasix 20 mg BID (this is similar to home torsemide dose), will follow  - she notes abdominal  distension today which occurs when she's overloaded, also has pulm venous congestion on 4/1 CXR - Continue home metoprolol - strict I/O, daily weights  Hyponatremia Hypokalemia-resolved > On presentation patient noted to have sodium of 122 and potassium 3.2. > Magnesium normal.  Received 40 mill equivalents p.o. potassium in the ED. > Due to worsening edema and reportedly elevated BNP did have her torsemide dose doubled for several days and this past week. - start lasix for concern for mild volume overload - trend lytes closely  - Will add on OSM (wnl), Ur OSM pending, Ur Na pending - TSH wnl   Multiple sclerosis Spasticity Neurogenic bladder Neurogenic hypoventilation > Patient with known history of MS with spasticity, neurogenic bladder and neurogenic hypoventilation.  Currently on baclofen pump for spasticity. - Continue with home baclofen pump - Treatment of hypoventilation as below   OSA OHS Obesity - Continue home BiPAP nightly -BMI 37.14   ACTH dependent Cushing's syndrome - Continue home corticotropin (not currently ordered, will need to ask pharmacy when last dose was, q10 days)   Atrial fibrillation - Continue home metoprolol - Home Eliquis   Hypothyroidism - Continue home Synthroid   Depression - Continue home bupropion, escitalopram - Continue home Xanax      DVT prophylaxis: eliquis Code Status: full Family Communication: none Disposition:   Status is: Inpatient Remains inpatient appropriate because: continued need for inpatient care   Consultants:  none  Procedures:  none  Antimicrobials:  Anti-infectives (From admission, onward)    Start     Dose/Rate Route Frequency Ordered Stop   11/22/22 0945  meropenem (MERREM) 1 g  in sodium chloride 0.9 % 100 mL IVPB        1 g 200 mL/hr over 30 Minutes Intravenous Every 8 hours 11/22/22 0849     11/21/22 1000  cefTRIAXone (ROCEPHIN) 1 g in sodium chloride 0.9 % 100 mL IVPB  Status:  Discontinued         1 g 200 mL/hr over 30 Minutes Intravenous Every 24 hours 11/20/22 1720 11/22/22 0753   11/20/22 1615  cefTRIAXone (ROCEPHIN) 1 g in sodium chloride 0.9 % 100 mL IVPB        1 g 200 mL/hr over 30 Minutes Intravenous  Once 11/20/22 1600 11/20/22 1707       Subjective: C/o SOB   Objective: Vitals:   11/21/22 2320 11/22/22 0323 11/22/22 0718 11/22/22 1234  BP: (!) 101/57 (!) 111/57 (!) 90/53 (!) 119/97  Pulse: 72 (!) 53 (!) 58 60  Resp: 15 13 16 19   Temp: 98.6 F (37 C) 98.2 F (36.8 C) 98 F (36.7 C) 98 F (36.7 C)  TempSrc: Oral Oral Oral Oral  SpO2: 100% 100% 94% 97%  Weight:  92.6 kg      Intake/Output Summary (Last 24 hours) at 11/22/2022 1631 Last data filed at 11/22/2022 1234 Gross per 24 hour  Intake 890 ml  Output 2500 ml  Net -1610 ml   Filed Weights   11/20/22 1903 11/21/22 0520 11/22/22 0323  Weight: 90.7 kg 95.1 kg 92.6 kg    Examination:  General exam: Appears calm and comfortable  Respiratory system: unlabored Cardiovascular system: RRR Gastrointestinal system: Abdomen is nondistended, soft and nontender L CVA tenderness Central nervous system: Alert and oriented. No focal neurological deficits. Extremities: trace LE edema   Data Reviewed: I have personally reviewed following labs and imaging studies  CBC: Recent Labs  Lab 11/20/22 1156 11/21/22 0331 11/22/22 0104  WBC 13.3* 11.4* 9.0  HGB 11.8* 12.2 11.1*  HCT 34.7* 36.1 32.1*  MCV 90.8 90.7 90.2  PLT 162 147* [id number]    Basic Metabolic Panel: Recent Labs  Lab 11/20/22 1156 11/20/22 1858 11/20/22 2111 11/21/22 0331 11/21/22 0606 11/21/22 1006 11/22/22 0104  NA 122*   < > 128* 130* 127* 128* 130*  K 3.2*   < > 3.4* 3.5 4.0 3.5 3.6  CL 78*   < > 83* 88* 86* 85* 89*  CO2 34*   < > 34* 31 28 33* 32  GLUCOSE 113*   < > 149* 90 84 121* 85  BUN 23   < > 18 17 18 16 16   CREATININE 0.99   < > 0.94 0.83 0.72 0.72 0.63  CALCIUM 7.9*   < > 8.2* 8.0* 8.3* 8.4* 8.3*  MG 1.9  --   --   --    --   --  1.9   < > = values in this interval not displayed.    GFR: Estimated Creatinine Clearance: 65.7 mL/min (by C-G formula based on SCr of 0.63 mg/dL).  Liver Function Tests: Recent Labs  Lab 11/20/22 1156  AST 16  ALT 8  ALKPHOS 60  BILITOT 1.0  PROT 5.5*  ALBUMIN 2.8*    CBG: No results for input(s): "GLUCAP" in the last 168 hours.   Recent Results (from the past 240 hour(s))  Resp panel by RT-PCR (RSV, Flu Trygve Thal&B, Covid) Anterior Nasal Swab     Status: None   Collection Time: 11/20/22 11:53 AM   Specimen: Anterior Nasal Swab  Result Value Ref Range Status  SARS Coronavirus 2 by RT PCR NEGATIVE NEGATIVE Final   Influenza Jaydeen Darley by PCR NEGATIVE NEGATIVE Final   Influenza B by PCR NEGATIVE NEGATIVE Final    Comment: (NOTE) The Xpert Xpress SARS-CoV-2/FLU/RSV plus assay is intended as an aid in the diagnosis of influenza from Nasopharyngeal swab specimens and should not be used as Fabyan Loughmiller sole basis for treatment. Nasal washings and aspirates are unacceptable for Xpert Xpress SARS-CoV-2/FLU/RSV testing.  Fact Sheet for Patients: EntrepreneurPulse.com.au  Fact Sheet for Healthcare Providers: IncredibleEmployment.be  This test is not yet approved or cleared by the [place] FDA and has been authorized for detection and/or diagnosis of SARS-CoV-2 by FDA under an Emergency Use Authorization (EUA). This EUA will remain in effect (meaning this test can be used) for the duration of the COVID-19 declaration under Section 564(b)(1) of the Act, 21 U.S.C. section 360bbb-3(b)(1), unless the authorization is terminated or revoked.     Resp Syncytial Virus by PCR NEGATIVE NEGATIVE Final    Comment: (NOTE) Fact Sheet for Patients: EntrepreneurPulse.com.au  Fact Sheet for Healthcare Providers: IncredibleEmployment.be  This test is not yet approved or cleared by the [place] FDA and has been authorized  for detection and/or diagnosis of SARS-CoV-2 by FDA under an Emergency Use Authorization (EUA). This EUA will remain in effect (meaning this test can be used) for the duration of the COVID-19 declaration under Section 564(b)(1) of the Act, 21 U.S.C. section 360bbb-3(b)(1), unless the authorization is terminated or revoked.  Performed at [facility], [place] [address], [place], [place] [address]   Urine Culture     Status: Abnormal   Collection Time: 11/20/22  1:15 PM   Specimen: Urine, Catheterized  Result Value Ref Range Status   Specimen Description URINE, CATHETERIZED  Final   Special Requests   Final    NONE Performed at [facility], [place] [address] [place], [place] [address]    Culture (Rathana Viveros)  Final    >=100,000 COLONIES/mL ESCHERICHIA COLI Confirmed Extended Spectrum Beta-Lactamase Producer (ESBL).  In bloodstream infections from ESBL organisms, carbapenems are preferred over piperacillin/tazobactam. They are shown to have Zeyad Delaguila lower risk of mortality.    Report Status 11/22/2022 FINAL  Final   Organism ID, Bacteria ESCHERICHIA COLI (Jamelah Sitzer)  Final      Susceptibility   Escherichia coli - MIC*    AMPICILLIN >=32 RESISTANT Resistant     CEFAZOLIN >=64 RESISTANT Resistant     CEFEPIME 16 RESISTANT Resistant     CEFTRIAXONE >=64 RESISTANT Resistant     CIPROFLOXACIN >=4 RESISTANT Resistant     GENTAMICIN <=1 SENSITIVE Sensitive     IMIPENEM <=0.25 SENSITIVE Sensitive     NITROFURANTOIN <=16 SENSITIVE Sensitive     TRIMETH/SULFA >=320 RESISTANT Resistant     AMPICILLIN/SULBACTAM 4 SENSITIVE Sensitive     PIP/TAZO <=4 SENSITIVE Sensitive     * >=100,000 COLONIES/mL ESCHERICHIA COLI         Radiology Studies: No results found.      Scheduled Meds:  ALPRAZolam  0.5 mg Oral QHS   apixaban  5 mg Oral BID   buPROPion  150 mg Oral Daily   escitalopram  10 mg Oral QHS   gabapentin  300 mg Oral 5 X Daily   lactulose  10 g Oral BID   levothyroxine  75 mcg Oral QAC  breakfast   loratadine  10 mg Oral Daily   melatonin  5 mg Oral QHS   metoprolol succinate  12.5 mg Oral  Daily   midodrine  10 mg Oral TID WC   pantoprazole  40 mg Oral Daily   polyethylene glycol  17 g Oral BID   polyvinyl alcohol  1 drop Both Eyes BID   sodium chloride flush  3 mL Intravenous Q12H   Continuous Infusions:  meropenem (MERREM) IV 1 g (11/22/22 1447)     LOS: 1 day    Time spent: over 30 min    Fayrene Helper, MD [facility]   To contact the attending provider between 7A-7P or the covering provider during after hours 7P-7A, please log into the web site [email or web address] and access using universal [place] password for that web site. If you do not have the password, please call the hospital operator.  11/22/2022, 4:31 PM

## 2022-11-23 DIAGNOSIS — N390 Urinary tract infection, site not specified: Secondary | ICD-10-CM | POA: Diagnosis not present

## 2022-11-23 DIAGNOSIS — A419 Sepsis, unspecified organism: Secondary | ICD-10-CM | POA: Diagnosis not present

## 2022-11-23 LAB — BASIC METABOLIC PANEL
Anion gap: 9 (ref 5–15)
BUN: 12 mg/dL (ref 8–23)
CO2: 34 mmol/L — ABNORMAL HIGH (ref 22–32)
Calcium: 8.8 mg/dL — ABNORMAL LOW (ref 8.9–10.3)
Chloride: 90 mmol/L — ABNORMAL LOW (ref 98–111)
Creatinine, Ser: 0.52 mg/dL (ref 0.44–1.00)
GFR, Estimated: >60 mL/min (ref >60)
Glucose, Bld: 103 mg/dL — ABNORMAL HIGH (ref 70–99)
Potassium: 3.7 mmol/L (ref 3.5–5.1)
Sodium: 133 mmol/L — ABNORMAL LOW (ref 135–145)

## 2022-11-23 LAB — CBC
HCT: 33.3 % — ABNORMAL LOW (ref 36.0–46.0)
Hemoglobin: 11.4 g/dL — ABNORMAL LOW (ref 12.0–15.0)
MCH: 30.7 pg (ref 26.0–34.0)
MCHC: 34.2 g/dL (ref 30.0–36.0)
MCV: 89.8 fL (ref 80.0–100.0)
Platelets: 210 10*3/uL (ref 150–400)
RBC: 3.71 MIL/uL — ABNORMAL LOW (ref 3.87–5.11)
RDW: 13.2 % (ref 11.5–15.5)
WBC: 7.4 10*3/uL (ref 4.0–10.5)
nRBC: 0 % (ref 0.0–0.2)

## 2022-11-23 LAB — MAGNESIUM: Magnesium: 2.1 mg/dL (ref 1.7–2.4)

## 2022-11-23 LAB — PHOSPHORUS: Phosphorus: 3 mg/dL (ref 2.5–4.6)

## 2022-11-23 MED ORDER — TORSEMIDE 20 MG PO TABS
30.0000 mg | ORAL_TABLET | Freq: Every day | ORAL | Status: DC
  Administered 2022-11-23 – 2022-11-24 (×2): 30 mg via ORAL
  Filled 2022-11-23 (×2): qty 2

## 2022-11-23 MED ORDER — SODIUM CHLORIDE 0.9% FLUSH
10.0000 mL | Freq: Two times a day (BID) | INTRAVENOUS | Status: DC
  Administered 2022-11-24: 10 mL

## 2022-11-23 MED ORDER — TORSEMIDE 20 MG PO TABS
20.0000 mg | ORAL_TABLET | Freq: Every day | ORAL | Status: DC
  Administered 2022-11-23 – 2022-11-24 (×2): 20 mg via ORAL
  Filled 2022-11-23 (×2): qty 1

## 2022-11-23 MED ORDER — SODIUM CHLORIDE 0.9 % IV SOLN
1.0000 g | INTRAVENOUS | Status: DC
  Administered 2022-11-23 – 2022-11-24 (×2): 1000 mg via INTRAVENOUS
  Filled 2022-11-23 (×2): qty 1

## 2022-11-23 MED ORDER — METOPROLOL TARTRATE 5 MG/5ML IV SOLN: 2.5000 mg | INTRAVENOUS | Status: DC | PRN

## 2022-11-23 MED ORDER — METOPROLOL TARTRATE 12.5 MG HALF TABLET
12.5000 mg | ORAL_TABLET | Freq: Once | ORAL | Status: AC
  Administered 2022-11-23: 12.5 mg via ORAL
  Filled 2022-11-23: qty 1

## 2022-11-23 MED ORDER — METOPROLOL SUCCINATE ER 25 MG PO TB24
25.0000 mg | ORAL_TABLET | Freq: Every day | ORAL | Status: DC
  Administered 2022-11-24: 25 mg via ORAL
  Filled 2022-11-23: qty 1

## 2022-11-23 MED ORDER — SODIUM CHLORIDE 0.9% FLUSH: 10.0000 mL | INTRAVENOUS | Status: DC | PRN

## 2022-11-23 NOTE — Progress Notes (Signed)
PHARMACY CONSULT NOTE FOR:  OUTPATIENT  PARENTERAL ANTIBIOTIC THERAPY (OPAT)  Indication: ESBL Ecoli UTI Regimen: ertapenem 1g IV q24h End date: 11/28/2022  IV antibiotic discharge orders are pended. To discharging provider:  please sign these orders via discharge navigator,  Select New Orders & click on the button choice - Manage This Unsigned Work.     Thank you for allowing pharmacy to be a part of this patient's care.  Candie Mile 11/23/2022, 12:49 PM

## 2022-11-23 NOTE — Progress Notes (Signed)
PROGRESS NOTE    Toni [name]  [id number] DOB: [birth year] DOA: 11/20/2022 PCP: Jodi Marble, MD  Chief Complaint  Patient presents with   Code Sepsis    Brief Narrative:   77 y.o. 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  She's been diagnosed with an ESBL UTI.  Assessment & Plan:   Principal Problem:   Sepsis secondary to UTI Active Problems:   UTI (urinary tract infection)   Permanent atrial fibrillation   Hypothyroidism   HLD (hyperlipidemia)   Neurogenic urinary bladder disorder   Obesity hypoventilation syndrome (HCC)   Depression with anxiety   Obstructive sleep apnea treated with bilevel positive airway pressure (BiPAP)   Multiple sclerosis   Neurogenic hypoventilation   Chronic diastolic CHF (congestive heart failure)   Class 2 severe obesity with serious comorbidity and body mass index (BMI) of 35.0 to 35.9 in adult   Cushing's syndrome, ACTH dependent   Pressure injury of skin  ESBL UTI  Concern for L Pyelonephritis Early sepsis > Patient presenting with fevers and dysuria.  Urinalysis with hemoglobin, leukocytes, bacteria.  Urine cultures pending.  Leukocytosis to 13.3.  In addition to this leukocytosis with urinary source patient noted to have tachycardia which does meet criteria for sepsis. - ESBL e. Coli on culture - renal US - with mild R and mild to moderate L hydro - will need outpatient follow up with urology (discussed case with them here) - plan to discharge on ertapenem with midline    Chronic combined diastolic and systolic CHF > Last echo was in 2023 with EF 45-50%, unable to determine diastolic function, normal RV function. - resume home torsemide - pulm venous congestion on 4/1  CXR - Continue home metoprolol - strict I/O, daily weights    Atrial fibrillation - Continue home metoprolol (RVR noted today, will bump dose to 25 mg daily, follow closely with hypotension on midodrine - holding parameters) - Home Eliquis   Hyponatremia Hypokalemia-resolved > On presentation patient noted to have sodium of 122 and potassium 3.2. > Magnesium normal.  Received 40 mill equivalents p.o. potassium in the ED. > Due to worsening edema and reportedly elevated BNP did have her torsemide dose doubled for several days and this past week. - resume home torsemide - Will add on OSM (wnl), Ur OSM pending, Ur Na pending - TSH wnl   Multiple sclerosis Spasticity Neurogenic bladder Neurogenic hypoventilation > Patient with known history of MS with spasticity, neurogenic bladder and neurogenic hypoventilation.  Currently on baclofen pump for spasticity. - Continue with home baclofen pump - Treatment of hypoventilation as below   OSA OHS Obesity - Continue home BiPAP nightly -BMI 37.14   ACTH dependent Cushing's syndrome - Continue home corticotropin (not currently ordered, will need to ask pharmacy when last dose was, q10 days)  Hypothyroidism - Continue home Synthroid   Depression - Continue home bupropion, escitalopram - Continue home Xanax      DVT prophylaxis: eliquis Code Status: full Family Communication: none Disposition:   Status is: Inpatient Remains inpatient appropriate because: continued need for inpatient care   Consultants:  none  Procedures:  none  Antimicrobials:  Anti-infectives (From admission, onward)    Start     Dose/Rate Route Frequency Ordered Stop   11/23/22 1330  ertapenem (INVANZ) 1,000 mg in sodium chloride 0.9 %  100 mL IVPB        1 g 200 mL/hr over 30 Minutes Intravenous Every 24 hours 11/23/22 1236 11/29/22 1329   11/22/22 0945  meropenem (MERREM) 1 g in sodium chloride 0.9 % 100 mL IVPB  Status:  Discontinued        1  g 200 mL/hr over 30 Minutes Intravenous Every 8 hours 11/22/22 0849 11/23/22 1236   11/21/22 1000  cefTRIAXone (ROCEPHIN) 1 g in sodium chloride 0.9 % 100 mL IVPB  Status:  Discontinued        1 g 200 mL/hr over 30 Minutes Intravenous Every 24 hours 11/20/22 1720 11/22/22 0753   11/20/22 1615  cefTRIAXone (ROCEPHIN) 1 g in sodium chloride 0.9 % 100 mL IVPB        1 g 200 mL/hr over 30 Minutes Intravenous  Once 11/20/22 1600 11/20/22 1707       Subjective: C/o constipation   Objective: Vitals:   11/22/22 2320 11/23/22 0340 11/23/22 0834 11/23/22 1251  BP: 125/65 (!) 109/59 108/71 97/77  Pulse: (!) 45 62 87 80  Resp: 16 15 16 18   Temp: 98.1 F (36.7 C) 98.1 F (36.7 C) 98.1 F (36.7 C) 98.3 F (36.8 C)  TempSrc: Oral Oral Oral Oral  SpO2: 100% 98% 96% 99%  Weight:        Intake/Output Summary (Last 24 hours) at 11/23/2022 1620 Last data filed at 11/23/2022 1500 Gross per 24 hour  Intake 740 ml  Output 3500 ml  Net -2760 ml   Filed Weights   11/20/22 1903 11/21/22 0520 11/22/22 0323  Weight: 90.7 kg 95.1 kg 92.6 kg    Examination:  General: No acute distress. Cardiovascular: intermittent RVR on tele, irregularly irregular Lungs: unlabored Abdomen: Soft, nontender, nondistended  Neurological: Alert and oriented 3. Moves all extremities 4 with equal strength. Cranial nerves II through XII grossly intact. Extremities: trace edema   Data Reviewed: I have personally reviewed following labs and imaging studies  CBC: Recent Labs  Lab 11/20/22 1156 11/21/22 0331 11/22/22 0104 11/23/22 0136  WBC 13.3* 11.4* 9.0 7.4  HGB 11.8* 12.2 11.1* 11.4*  HCT 34.7* 36.1 32.1* 33.3*  MCV 90.8 90.7 90.2 89.8  PLT 162 147* 180 [id number]    Basic Metabolic Panel: Recent Labs  Lab 11/20/22 1156 11/20/22 1858 11/21/22 0331 11/21/22 0606 11/21/22 1006 11/22/22 0104 11/23/22 0136  NA 122*   < > 130* 127* 128* 130* 133*  K 3.2*   < > 3.5 4.0 3.5 3.6 3.7  CL 78*   < > 88* 86*  85* 89* 90*  CO2 34*   < > 31 28 33* 32 34*  GLUCOSE 113*   < > 90 84 121* 85 103*  BUN 23   < > 17 18 16 16 12   CREATININE 0.99   < > 0.83 0.72 0.72 0.63 0.52  CALCIUM 7.9*   < > 8.0* 8.3* 8.4* 8.3* 8.8*  MG 1.9  --   --   --   --  1.9 2.1  PHOS  --   --   --   --   --   --  3.0   < > = values in this interval not displayed.    GFR: Estimated Creatinine Clearance: 65.7 mL/min (by C-G formula based on SCr of 0.52 mg/dL).  Liver Function Tests: Recent Labs  Lab 11/20/22 1156  AST 16  ALT 8  ALKPHOS 60  BILITOT 1.0  PROT 5.5*  ALBUMIN 2.8*  CBG: No results for input(s): "GLUCAP" in the last 168 hours.   Recent Results (from the past 240 hour(s))  Resp panel by RT-PCR (RSV, Flu Keith Felten&B, Covid) Anterior Nasal Swab     Status: None   Collection Time: 11/20/22 11:53 AM   Specimen: Anterior Nasal Swab  Result Value Ref Range Status   SARS Coronavirus 2 by RT PCR NEGATIVE NEGATIVE Final   Influenza Laiden Milles by PCR NEGATIVE NEGATIVE Final   Influenza B by PCR NEGATIVE NEGATIVE Final    Comment: (NOTE) The Xpert Xpress SARS-CoV-2/FLU/RSV plus assay is intended as an aid in the diagnosis of influenza from Nasopharyngeal swab specimens and should not be used as Chantella Creech sole basis for treatment. Nasal washings and aspirates are unacceptable for Xpert Xpress SARS-CoV-2/FLU/RSV testing.  Fact Sheet for Patients: EntrepreneurPulse.com.au  Fact Sheet for Healthcare Providers: IncredibleEmployment.be  This test is not yet approved or cleared by the [place] FDA and has been authorized for detection and/or diagnosis of SARS-CoV-2 by FDA under an Emergency Use Authorization (EUA). This EUA will remain in effect (meaning this test can be used) for the duration of the COVID-19 declaration under Section 564(b)(1) of the Act, 21 U.S.C. section 360bbb-3(b)(1), unless the authorization is terminated or revoked.     Resp Syncytial Virus by PCR NEGATIVE  NEGATIVE Final    Comment: (NOTE) Fact Sheet for Patients: EntrepreneurPulse.com.au  Fact Sheet for Healthcare Providers: IncredibleEmployment.be  This test is not yet approved or cleared by the [place] FDA and has been authorized for detection and/or diagnosis of SARS-CoV-2 by FDA under an Emergency Use Authorization (EUA). This EUA will remain in effect (meaning this test can be used) for the duration of the COVID-19 declaration under Section 564(b)(1) of the Act, 21 U.S.C. section 360bbb-3(b)(1), unless the authorization is terminated or revoked.  Performed at [name], [place] [address] [place], [place] [address]   Urine Culture     Status: Abnormal   Collection Time: 11/20/22  1:15 PM   Specimen: Urine, Catheterized  Result Value Ref Range Status   Specimen Description URINE, CATHETERIZED  Final   Special Requests   Final    NONE Performed at [facility], [place] [address] [place],  [address]    Culture (Cameo Shewell)  Final    >=100,000 COLONIES/mL ESCHERICHIA COLI Confirmed Extended Spectrum Beta-Lactamase Producer (ESBL).  In bloodstream infections from ESBL organisms, carbapenems are preferred over piperacillin/tazobactam. They are shown to have Ayauna Mcnay lower risk of mortality.    Report Status 11/22/2022 FINAL  Final   Organism ID, Bacteria ESCHERICHIA COLI (Calianne Larue)  Final      Susceptibility   Escherichia coli - MIC*    AMPICILLIN >=32 RESISTANT Resistant     CEFAZOLIN >=64 RESISTANT Resistant     CEFEPIME 16 RESISTANT Resistant     CEFTRIAXONE >=64 RESISTANT Resistant     CIPROFLOXACIN >=4 RESISTANT Resistant     GENTAMICIN <=1 SENSITIVE Sensitive     IMIPENEM <=0.25 SENSITIVE Sensitive     NITROFURANTOIN <=16 SENSITIVE Sensitive     TRIMETH/SULFA >=320 RESISTANT Resistant     AMPICILLIN/SULBACTAM 4 SENSITIVE Sensitive     PIP/TAZO <=4 SENSITIVE Sensitive     * >=100,000 COLONIES/mL ESCHERICHIA COLI          Radiology Studies: US RENAL  Result Date: 11/22/2022 CLINICAL DATA:  UTI EXAM: RENAL / URINARY TRACT ULTRASOUND COMPLETE COMPARISON:  CT 07/13/2022, CT 06/04/2020 FINDINGS: Right Kidney: Renal measurements: 11.8 by 5.1 x 5.8  cm = volume: 182.8 mL. Echogenicity is normal. No mass. Mild hydronephrosis. Left Kidney: Renal measurements: 11.3 x 5.6 x 5.1 cm = volume: 168.3 mL. Echogenicity is normal. No mass. Mild to moderate left hydronephrosis. Bladder: Appears normal for degree of bladder distention. Other: None. IMPRESSION: Mild right and mild to moderate left hydronephrosis, likely similar as compared with CT from November. Electronically Signed   By: Donavan Foil M.D.   On: 11/22/2022 19:49        Scheduled Meds:  ALPRAZolam  0.5 mg Oral QHS   apixaban  5 mg Oral BID   buPROPion  150 mg Oral Daily   escitalopram  10 mg Oral QHS   furosemide  20 mg Intravenous BID   gabapentin  300 mg Oral 5 X Daily   lactulose  10 g Oral BID   levothyroxine  75 mcg Oral QAC breakfast   loratadine  10 mg Oral Daily   melatonin  5 mg Oral QHS   metoprolol succinate  12.5 mg Oral Daily   midodrine  10 mg Oral TID WC   pantoprazole  40 mg Oral Daily   polyethylene glycol  17 g Oral BID   polyvinyl alcohol  1 drop Both Eyes BID   sodium chloride flush  3 mL Intravenous Q12H   Continuous Infusions:  ertapenem 1,000 mg (11/23/22 1426)     LOS: 2 days    Time spent: over 30 min    Fayrene Helper, MD [facility]   To contact the attending provider between 7A-7P or the covering provider during after hours 7P-7A, please log into the web site [email or web address] and access using universal [place] password for that web site. If you do not have the password, please call the hospital operator.  11/23/2022, 4:20 PM

## 2022-11-23 NOTE — Evaluation (Signed)
Occupational Therapy Evaluation and DC Summary Patient Details Name: Toni [name] MRN: [id number] DOB: 1946/01/02 Today's Date: 11/23/2022   History of Present Illness 77 yo female who was admitted 11/20/22 from [place] facility admitted for ongoing fever and dysuria. PMH includes: PAF, HFpEF EF 45-50%, baclofen pump arthritis, cancer, CHF, HLD, HTN, MS, and cervical fusion.   Clinical Impression   Patient admitted for above diagnosis. PTA, patient lived at [facility] with assistance from aides and staff, functional ambulation with maxi move for t/fs and w/c for community ambulaton. Patient currently completing  bed mobility with Mod A 2+ for safety. Patient presenting at or close to baseline. Patient would benefit from returning to admitting post acute skilled rehab facility with 3+ hours of therapy and 24/7 support .     Recommendations for follow up therapy are one component of a multi-disciplinary discharge planning process, led by the attending physician.  Recommendations may be updated based on patient status, additional functional criteria and insurance authorization.   Assistance Recommended at Discharge Frequent or constant Supervision/Assistance  Patient can return home with the following Two people to help with walking and/or transfers;A lot of help with bathing/dressing/bathroom;Assistance with cooking/housework;Assist for transportation;Help with stairs or ramp for entrance;Assistance with feeding    Functional Status Assessment  Patient has not had a recent decline in their functional status  Equipment Recommendations  Other (comment) (Defer to next venue)    Recommendations for Other Services       Precautions / Restrictions Precautions Precautions: Fall Restrictions Weight Bearing Restrictions: No      Mobility Bed Mobility Overal bed mobility: Needs Assistance Bed Mobility: Supine to Sit, Sit to Supine,  Rolling Rolling: Mod assist   Supine to sit: Mod assist, HOB elevated Sit to supine: +2 for safety/equipment, Mod assist, +2 for physical assistance        Transfers                   General transfer comment: Pt uses maxi move at baseline      Balance Overall balance assessment: Needs assistance Sitting-balance support: Feet supported Sitting balance-Leahy Scale: Fair Sitting balance - Comments: Min guard to Min A for posterior lean Postural control: Posterior lean                                 ADL either performed or assessed with clinical judgement   ADL Overall ADL's : At baseline                                       General ADL Comments: Patient receives assistance with t/f and dressing/bathing at baseline. Max A for lower body dressing and bathing, uses maxi move lift for t/f and w/c in community     Vision         Perception     Praxis      Pertinent Vitals/Pain Pain Assessment Pain Assessment: Faces Faces Pain Scale: Hurts a little bit Pain Location: Stomach Pain Descriptors / Indicators: Aching Pain Intervention(s): Limited activity within patient's tolerance, Monitored during session     Hand Dominance Right   Extremity/Trunk Assessment Upper Extremity Assessment Upper Extremity Assessment: Overall WFL for tasks assessed   Lower Extremity Assessment Lower Extremity Assessment: Overall WFL for tasks assessed   Cervical /  Trunk Assessment Cervical / Trunk Assessment: Other exceptions Cervical / Trunk Exceptions: increased body habitus   Communication Communication Communication: No difficulties   Cognition Arousal/Alertness: Awake/alert Behavior During Therapy: WFL for tasks assessed/performed Overall Cognitive Status: Within Functional Limits for tasks assessed                                 General Comments: Patient is very pleasant to work with and likes to engage in therapy      General Comments  VSS on RA    Exercises     Shoulder Instructions      Home Living Family/patient expects to be discharged to:: Assisted living                             Home Equipment: BSC/3in1;Adaptive equipment;Wheelchair - manual;Hospital bed   Additional Comments: Environmental education officer      Prior Functioning/Environment Prior Level of Function : Needs assist             Mobility Comments: uses maxi move lift to transfer bed>wheelchair, BSC, lift chair. able to sit up to EOB with some assist. ADLs Comments: Aide assists with dressing, uses BSC for toileting        OT Problem List:        OT Treatment/Interventions:      OT Goals(Current goals can be found in the care plan section) Acute Rehab OT Goals Patient Stated Goal: To go back to SNF OT Goal Formulation: With patient Time For Goal Achievement: 12/07/22 Potential to Achieve Goals: Fair  OT Frequency:      Co-evaluation PT/OT/SLP Co-Evaluation/Treatment: Yes Reason for Co-Treatment: For patient/therapist safety PT goals addressed during session: Mobility/safety with mobility OT goals addressed during session: ADL's and self-care      AM-PAC OT "6 Clicks" Daily Activity     Outcome Measure Help from another person eating meals?: A Little Help from another person taking care of personal grooming?: A Little Help from another person toileting, which includes using toliet, bedpan, or urinal?: Total Help from another person bathing (including washing, rinsing, drying)?: A Lot Help from another person to put on and taking off regular upper body clothing?: A Little Help from another person to put on and taking off regular lower body clothing?: Total 6 Click Score: 13   End of Session Nurse Communication: Need for lift equipment  Activity Tolerance: Patient tolerated treatment well Patient left: in bed;with family/visitor present  OT Visit Diagnosis: Other (comment) (fever)                 Time: 1410-1450 OT Time Calculation (min): 40 min Charges:  OT General Charges $OT Visit: 1 Visit OT Evaluation $OT Eval Moderate Complexity: 1 Mod  11/23/2022  AB, OTR/L  Acute Rehabilitation Services  Office: [place] 11/23/2022, 3:09 PM

## 2022-11-23 NOTE — Evaluation (Signed)
Physical Therapy Evaluation Patient Details Name: EVOLETTE LASSWELL MRN: [id number] DOB: 1945/09/17 Today's Date: 11/23/2022  History of Present Illness  77 yo female who was admitted 11/20/22 from [place] facility admitted for ongoing fever and dysuria. PMH includes: PAF, HFpEF EF 45-50%, baclofen pump arthritis, cancer, CHF, HLD, HTN, MS, and cervical fusion.  Clinical Impression  Pt presents from long term care at [facility]. At baseline staff gets pt up to w/c or bsc with mechanical lift. Pt has been nonambulatory for quite some time. Recommend return to [facility] and continued use of mechanical lift.        Recommendations for follow up therapy are one component of a multi-disciplinary discharge planning process, led by the attending physician.  Recommendations may be updated based on patient status, additional functional criteria and insurance authorization.  Follow Up Recommendations Can patient physically be transported by private vehicle: No     Assistance Recommended at Discharge Frequent or constant Supervision/Assistance  Patient can return home with the following  A lot of help with walking and/or transfers;A lot of help with bathing/dressing/bathroom    Equipment Recommendations None recommended by PT  Recommendations for Other Services       Functional Status Assessment Patient has not had a recent decline in their functional status     Precautions / Restrictions Precautions Precautions: Fall Restrictions Weight Bearing Restrictions: No      Mobility  Bed Mobility Overal bed mobility: Needs Assistance Bed Mobility: Rolling, Sit to Supine Rolling: Mod assist     Sit to supine: Mod assist, +2 for safety/equipment   General bed mobility comments: Assist to bring shoulders/hips over to roll. Assist to bring legs back up into bed returning to supine    Transfers                   General transfer comment: Pt uses mechanical lift  at baseline    Ambulation/Gait                  Stairs            Wheelchair Mobility    Modified Rankin (Stroke Patients Only)       Balance Overall balance assessment: Needs assistance Sitting-balance support: Feet supported Sitting balance-Leahy Scale: Poor Sitting balance - Comments: Intermittent min guard to min assist Postural control: Posterior lean                                   Pertinent Vitals/Pain Pain Assessment Pain Assessment: Faces Faces Pain Scale: Hurts a little bit Pain Location: Stomach Pain Descriptors / Indicators: Aching Pain Intervention(s): Limited activity within patient's tolerance    Home Living Family/patient expects to be discharged to:: Assisted living (long term care)                 Home Equipment: BSC/3in1;Adaptive equipment;Hospital bed;Wheelchair - Education administrator (comment) (mechanical lift) Additional Comments: [facility] x 2 years    Prior Function Prior Level of Function : Needs assist       Physical Assist : Mobility (physical) Mobility (physical): Bed mobility;Transfers   Mobility Comments: uses mechanical lift to transfer bed>wheelchair, BSC, lift chair. able to sit up to EOB with some assist. ADLs Comments: Aide assists with dressing, uses BSC for toileting     Hand Dominance   Dominant Hand: Right    Extremity/Trunk Assessment   Upper Extremity Assessment Upper Extremity  Assessment: Defer to OT evaluation    Lower Extremity Assessment Lower Extremity Assessment: RLE deficits/detail;LLE deficits/detail RLE Deficits / Details: <2/5 strength LLE Deficits / Details: <2/5 strength    Cervical / Trunk Assessment Cervical / Trunk Assessment: Other exceptions Cervical / Trunk Exceptions: increased body habitus  Communication   Communication: No difficulties  Cognition Arousal/Alertness: Awake/alert Behavior During Therapy: WFL for tasks assessed/performed Overall  Cognitive Status: Within Functional Limits for tasks assessed                                          General Comments General comments (skin integrity, edema, etc.): VSS on RA    Exercises     Assessment/Plan    PT Assessment Patient does not need any further PT services  PT Problem List         PT Treatment Interventions      PT Goals (Current goals can be found in the Care Plan section)  Acute Rehab PT Goals PT Goal Formulation: All assessment and education complete, DC therapy    Frequency       Co-evaluation   Reason for Co-Treatment: For patient/therapist safety PT goals addressed during session: Mobility/safety with mobility OT goals addressed during session: ADL's and self-care       AM-PAC PT "6 Clicks" Mobility  Outcome Measure Help needed turning from your back to your side while in a flat bed without using bedrails?: A Lot Help needed moving from lying on your back to sitting on the side of a flat bed without using bedrails?: A Lot Help needed moving to and from a bed to a chair (including a wheelchair)?: Total Help needed standing up from a chair using your arms (e.g., wheelchair or bedside chair)?: Total Help needed to walk in hospital room?: Total Help needed climbing 3-5 steps with a railing? : Total 6 Click Score: 8    End of Session   Activity Tolerance: Patient tolerated treatment well Patient left: in bed;with call bell/phone within reach;with family/visitor present Nurse Communication: Mobility status;Need for lift equipment PT Visit Diagnosis: Other abnormalities of gait and mobility (R26.89)    Time: [id number] PT Time Calculation (min) (ACUTE ONLY): 17 min   Charges:   PT Evaluation $PT Eval Moderate Complexity: [place] Office [place] 11/23/2022, 5:19 PM

## 2022-11-23 NOTE — Consult Note (Signed)
Consultation: Neurogenic bladder, bilateral hydronephrosis, possible UTI Requested by: Dr. Fayrene Helper  History of Present Illness: Toni [name] is a 77 year old female well-known to urology.  She follows with Dr. Matilde Sprang. She has a mass in the neurogenic bladder.  She has had some chronic bilateral hydronephrosis but is preferred to manage without a catheter.  She is typically incontinent.  She has recurrent UTI.  She is failed several modalities including antibiotic prophylaxis and transvaginal estrogen.  She was admitted with reported fever of 102.  I did not see a documented fever in the emergency or on the floor.  UA typical for her with many bacteria.  Fortunately this has not affected her kidney function.  Creatinine was 0.99 and now 0.52.  White count was up to 13.3 and now 7.4.  She underwent a renal ultrasound 11/22/2022 which revealed mild right to moderate left hydronephrosis similar to her CT scan in November 2023.  She reports she is feeling better.  She said she typically does not have fevers with her UTIs.  She is now nonambulatory and in skilled nursing.  She does have her own caregivers.  They are good about keeping her hydrated.  Of note, she is also had a 15 mm left upper pole renal mass.  This appeared stable on the November 2023 CT.  The only lesion I saw was about 13 mm.  She saw Dr. Tresa Moore for this.  Past Medical History:  Diagnosis Date   Abnormality of gait [id number]   Acute metabolic encephalopathy [id number]   Anxiety    Arthritis    "knees" (01/19/2016)   Asthma    as a child    Atrial fibrillation    Back pain    Bilateral carpal tunnel syndrome 10/04/2020   Cancer    / RENAL CELL CARCINOMA - GETS CT SCAN EVERY 6 MONTHS TO WATCH    CHF (congestive heart failure)    Chronic pain    "nerve pain from the MS"   Complex atypical endometrial hyperplasia    Constipation    COVID-19 virus infection 08/23/2019   Depression    DVT (deep venous thrombosis) 1983    "RLE; may have just been phlebitis; it was before the age of dopplers"   Dysrhythmia    afib    Edema    varicose veins with severe venous insuff in R and L GSV' ablation of R GSV 2012   GERD (gastroesophageal reflux disease)    HLD (hyperlipidemia)    Hypertension    Hypotension    Hypothyroidism    Joint pain    Multiple sclerosis    has had this may 1989-[facility] reg doc   OSA on CPAP    bipap machine - SLEEP APNEA WORSE SINCE COVID IN 1/21 PER PATIENT SETTING HAS INCREASED FROM 9 TO 30    Osteoarthritis    PAF (paroxysmal atrial fibrillation)    on coumadin; documented on monitor 06/2010 - no longer takes per Kym Groom 02/09/21   Pneumonia 11/2011   Sleep apnea    uses sleep apnea machine   Past Surgical History:  Procedure Laterality Date   ANTERIOR CERVICAL DECOMP/DISCECTOMY FUSION  01/2004   CARDIAC CATHETERIZATION  06/22/2010   normal coronary arteries, PAF   CATARACT EXTRACTION, BILATERAL Bilateral    CHILD BIRTHS     X2   DILATATION & CURETTAGE/HYSTEROSCOPY WITH MYOSURE N/A 01/15/2020   Procedure: DILATATION & CURETTAGE/HYSTEROSCOPY WITH MYOSURE;  Surgeon: Arvella Nigh, MD;  Location: [facility];  Service:  Gynecology;  Laterality: N/A;  pt in wheelchair-has MS   DILATION AND CURETTAGE OF UTERUS     DILATION AND CURETTAGE OF UTERUS N/A 02/16/2020   Procedure: DILATATION AND CURETTAGE;  Surgeon: Lafonda Mosses, MD;  Location: [facility];  Service: Gynecology;  Laterality: N/A;  NOT GENERAL -NO INTUBATION   DNC     INFUSION PUMP IMPLANTATION  ~ 2009   baclofen infusion in lower abd   INTRATHECAL PUMP REVISION N/A 02/18/2021   Procedure: Baclofen Pump replacement with possible revision of catheter;  Surgeon: Erline Levine, MD;  Location: [place];  Service: Neurosurgery;  Laterality: N/A;  3C/RM 20   INTRAUTERINE DEVICE (IUD) INSERTION N/A 02/16/2020   Procedure: INTRAUTERINE DEVICE (IUD) INSERTION - MIRENA;  Surgeon: Lafonda Mosses, MD;  Location: [facility];  Service:  Gynecology;  Laterality: N/A;   IUD put in uterus     PAIN PUMP REVISION N/A 07/29/2014   Procedure: Baclofen pump replacement;  Surgeon: Erline Levine, MD;  Location: [place];  Service: Neurosurgery;  Laterality: N/A;  Baclofen pump replacement   TONSILLECTOMY AND ADENOIDECTOMY     VARICOSE VEIN SURGERY  ~ [place]  12/16/2010   radiofreq ablation -Dr Elisabeth Cara and [facility] Medications:  Medications Prior to Admission  Medication Sig Dispense Refill Last Dose   acetaminophen (TYLENOL) 500 MG tablet Take 1,000 mg by mouth every 8 (eight) hours as needed for fever or headache (pain).   11/19/2022   ALPRAZolam (XANAX) 0.5 MG tablet Take 1 tablet (0.5 mg total) by mouth at bedtime. May also take 1 tablet every 12 hours as needed anxiety (Patient taking differently: Take 0.5 mg by mouth at bedtime.) 10 tablet 0 11/19/2022   apixaban (ELIQUIS) 5 MG TABS tablet Take 5 mg by mouth 2 (two) times daily.   11/19/2022 at 2152   baclofen (LIORESAL) 20 MG tablet Take 20 mg by mouth daily as needed for muscle spasms.   Past Week   Biotin 10000 MCG TABS Take 10,000 mcg by mouth daily after lunch.   11/19/2022   bisacodyl (DULCOLAX) 10 MG suppository Place 10 mg rectally daily as needed (constipation).   UNK   buPROPion (WELLBUTRIN XL) 150 MG 24 hr tablet Take 1 tablet (150 mg total) by mouth daily. 90 tablet 1 11/19/2022   Calcium Carb-Cholecalciferol (CALCIUM 500/D) 500-10 MG-MCG CHEW Chew 1 tablet by mouth 2 (two) times daily.   11/19/2022   cetirizine (ZYRTEC) 10 MG tablet Take 10 mg by mouth daily as needed for allergies.   UNK   cholecalciferol 25 MCG (1000 UT) tablet Take 2,000 Units by mouth 2 (two) times daily with breakfast and lunch.   11/19/2022   clotrimazole (LOTRIMIN) 1 % cream Apply 1 Application topically 2 (two) times daily. Apply to neck folds.   11/19/2022   corticotropin (ACTHAR) 80 UNIT/ML injectable gel Inject 1 mL (80 Units total) into the  skin every other day for 10 days. May give additional doses PRN. (Patient taking differently: Inject 80 Units into the skin See admin instructions. Inject 80 units as needed (about 5 times a month)) 8 mL 3 Past Month   D-Mannose 500 MG CAPS Take 2,000 mg by mouth daily.   11/19/2022   diclofenac Sodium (VOLTAREN) 1 % GEL Apply 2 g topically 3 (three) times daily as needed. (Patient taking differently: Apply 2 g topically 3 (three) times daily as needed (pain). To bilateral  shoulders) 100 g 3 Past Week   diphenhydrAMINE (BENADRYL) 25 mg capsule Take 25 mg by mouth 3 (three) times daily as needed (minor itching, irritation).   UNK   Emollient (CERAVE DAILY MOISTURIZING) LOTN Apply 1 application  topically daily. To groin, inguinal area   11/19/2022   escitalopram (LEXAPRO) 10 MG tablet Take 1 tablet (10 mg total) by mouth daily. (Patient taking differently: Take 10 mg by mouth at bedtime.) 90 tablet 1 11/19/2022   famotidine (PEPCID) 20 MG tablet Take 20 mg by mouth at bedtime.   11/19/2022   gabapentin (NEURONTIN) 300 MG capsule Mrs. Mccorry will take 300 mg of gabapentin at 6 AM, 10 AM, 2 PM and 6 PM and 600 mg at 10 PM by mouth. (Patient taking differently: Take 300 mg by mouth See admin instructions. 300 mg 5 times daily at 0600, 1000, 1400, 1800 and 300-600 mg at bedtime.) 180 capsule 11 11/20/2022   lactulose (CHRONULAC) 10 GM/15ML solution Take 15 mLs (10 g total) by mouth 2 (two) times daily as needed for mild constipation. (Patient taking differently: Take 10 g by mouth 2 (two) times daily.) 236 mL 0 11/19/2022   levothyroxine (SYNTHROID) 75 MCG tablet Take 75 mcg by mouth daily before breakfast.   11/20/2022   melatonin 5 MG TABS Take 5 mg by mouth at bedtime.   11/19/2022   metolazone (ZAROXOLYN) 2.5 MG tablet Take 2.5 mg by mouth daily as needed (weight gain > 3 lbs in 2 days with swelling.).   UNK   metoprolol succinate (TOPROL-XL) 25 MG 24 hr tablet Take 0.5 tablets (12.5 mg total) by mouth daily. Take  with or immediately following a meal. 90 tablet 3 11/19/2022 at 1032   Multiple Vitamins-Minerals (CEROVITE SENIOR) TABS Take 1 tablet by mouth daily.   11/19/2022   NONFORMULARY OR COMPOUNDED ITEM Apply 1 Application topically See admin instructions. 1:1 clotrimazole 1% and hydrocortisone 1%. Apply twice daily to perineal region and abdominal folds.   11/19/2022   omeprazole (PRILOSEC) 40 MG capsule Take 1 capsule (40 mg total) by mouth every morning. 30 capsule 11 11/20/2022   ondansetron (ZOFRAN-ODT) 4 MG disintegrating tablet Take 4 mg by mouth 4 (four) times daily.   11/20/2022   phenazopyridine (PYRIDIUM) 100 MG tablet Take 100 mg by mouth every 8 (eight) hours as needed (dysuria).   Past Week   phenylephrine-shark liver oil-mineral oil-petrolatum (PREPARATION H) 0.25-14-74.9 % rectal ointment Place 1 Application rectally every 6 (six) hours as needed (rectum discomfort).   UNK   potassium chloride SA (KLOR-CON) 20 MEQ tablet Take 2 tablets (40 mEq total) by mouth daily. 180 tablet 3 11/19/2022   promethazine (PHENERGAN) 12.5 MG tablet Take 12.5 mg by mouth every 8 (eight) hours as needed for nausea or vomiting.   11/19/2022   Propylene Glycol-Glycerin (ARTIFICIAL TEARS) 1-0.3 % SOLN Place 1-2 drops into both eyes See admin instructions. 2 entries on MAR: 1) 1 drop into each eye twice daily 2) 2 drops into each eye once daily as needed for dry eyes   11/19/2022   Prucalopride Succinate (MOTEGRITY) 2 MG TABS Take 2 mg by mouth daily.   11/20/2022   Psyllium (METAMUCIL PO) Take 1 packet by mouth 2 (two) times daily. Metamucil 2.4g/5.4g powder packets.   11/19/2022   senna (SENOKOT) 8.6 MG TABS tablet Take 17.2 mg by mouth 2 (two) times daily.   11/19/2022   sodium phosphate (FLEET) 7-19 GM/118ML ENEM Place 1 enema rectally daily as needed (constipation).  UNK   torsemide (DEMADEX) 20 MG tablet Take 2 tablets (40 mg total) by mouth daily. (Patient taking differently: Take 20-30 mg by mouth See admin  instructions. 20 mg every morning, 30 mg in the evening.)   11/19/2022   midodrine (PROAMATINE) 2.5 MG tablet Take 3 tablets (7.5 mg total) by mouth 3 (three) times daily with meals. (Patient not taking: Reported on 11/20/2022)   Not Taking   senna-docusate (SENOKOT-S) 8.6-50 MG tablet Take 2 tablets by mouth at bedtime. (Patient not taking: Reported on 11/20/2022)   Not Taking   Allergies:  Allergies  Allergen Reactions   Other Hives and Rash    Topical Surgical prep  - severe rash, hives   Hydrocodone Nausea And Vomiting   Oxycontin [Oxycodone] Nausea And Vomiting   Zoloft [Sertraline] Hives, Swelling and Rash   Chloraseptic [Phenol] Hives    Not documented on MAR   Macrobid [Nitrofurantoin] Nausea And Vomiting    Not documented on MAR   Vioxx [Rofecoxib] Other (See Comments)    Unknown reaction Not documented on MAR   Hibiclens [Chlorhexidine Gluconate] Hives, Rash and Other (See Comments)    Sores    Family History  Problem Relation Age of Onset   Depression Mother    Sudden death Mother    Anxiety disorder Mother    Coronary artery disease Father        at age 34   Hyperlipidemia Father    Thyroid disease Father    Coronary artery disease Maternal Grandmother    Depression Maternal Grandmother    Cancer Paternal Grandmother        breast   Alcoholism Son    Sleep apnea Neg Hx    Multiple sclerosis Neg Hx    Social History:  reports that she quit smoking about 46 years ago. Her smoking use included cigarettes. She has a 3.50 pack-year smoking history. She has never used smokeless tobacco. She reports that she does not currently use alcohol. She reports that she does not use drugs.  ROS: A complete review of systems was performed.  All systems are negative except for pertinent findings as noted. Review of Systems  All other systems reviewed and are negative.    Physical Exam:  Vital signs in last 24 hours: Temp:  [98 F (36.7 C)-98.4 F (36.9 C)] 98.4 F (36.9 C)  (04/04 1659) Pulse Rate:  [45-87] 65 (04/04 1659) Resp:  [15-18] 16 (04/04 1659) BP: (97-125)/(58-77) 117/66 (04/04 1659) SpO2:  [93 %-100 %] 96 % (04/04 1659) General:  Alert and oriented, No acute distress, watching TV HEENT: Normocephalic, atraumatic Cardiovascular: Regular rate and rhythm Lungs: Regular rate and effort Abdomen: Soft, nontender, nondistended, no abdominal masses Back: No CVA tenderness Extremities: No edema Neurologic: Grossly intact  Laboratory Data:  Results for orders placed or performed during the hospital encounter of 11/20/22 (from the past 24 hour(s))  Basic metabolic panel     Status: Abnormal   Collection Time: 11/23/22  1:36 AM  Result Value Ref Range   Sodium 133 (L) 135 - 145 mmol/L   Potassium 3.7 3.5 - 5.1 mmol/L   Chloride 90 (L) 98 - 111 mmol/L   CO2 34 (H) 22 - 32 mmol/L   Glucose, Bld 103 (H) 70 - 99 mg/dL   BUN 12 8 - 23 mg/dL   Creatinine, Ser 0.52 0.44 - 1.00 mg/dL   Calcium 8.8 (L) 8.9 - 10.3 mg/dL   GFR, Estimated >60 >60 mL/min   Anion  gap 9 5 - 15  Magnesium     Status: None   Collection Time: 11/23/22  1:36 AM  Result Value Ref Range   Magnesium 2.1 1.7 - 2.4 mg/dL  Phosphorus     Status: None   Collection Time: 11/23/22  1:36 AM  Result Value Ref Range   Phosphorus 3.0 2.5 - 4.6 mg/dL  CBC     Status: Abnormal   Collection Time: 11/23/22  1:36 AM  Result Value Ref Range   WBC 7.4 4.0 - 10.5 K/uL   RBC 3.71 (L) 3.87 - 5.11 MIL/uL   Hemoglobin 11.4 (L) 12.0 - 15.0 g/dL   HCT 33.3 (L) 36.0 - 46.0 %   MCV 89.8 80.0 - 100.0 fL   MCH 30.7 26.0 - 34.0 pg   MCHC 34.2 30.0 - 36.0 g/dL   RDW 13.2 11.5 - 15.5 %   Platelets 210 150 - 400 K/uL   nRBC 0.0 0.0 - 0.2 %   *Note: Due to a large number of results and/or encounters for the requested time period, some results have not been displayed. A complete set of results can be found in Results Review.   Recent Results (from the past 240 hour(s))  Resp panel by RT-PCR (RSV, Flu A&B,  Covid) Anterior Nasal Swab     Status: None   Collection Time: 11/20/22 11:53 AM   Specimen: Anterior Nasal Swab  Result Value Ref Range Status   SARS Coronavirus 2 by RT PCR NEGATIVE NEGATIVE Final   Influenza A by PCR NEGATIVE NEGATIVE Final   Influenza B by PCR NEGATIVE NEGATIVE Final    Comment: (NOTE) The Xpert Xpress SARS-CoV-2/FLU/RSV plus assay is intended as an aid in the diagnosis of influenza from Nasopharyngeal swab specimens and should not be used as a sole basis for treatment. Nasal washings and aspirates are unacceptable for Xpert Xpress SARS-CoV-2/FLU/RSV testing.  Fact Sheet for Patients: EntrepreneurPulse.com.au  Fact Sheet for Healthcare Providers: IncredibleEmployment.be  This test is not yet approved or cleared by the [place] FDA and has been authorized for detection and/or diagnosis of SARS-CoV-2 by FDA under an Emergency Use Authorization (EUA). This EUA will remain in effect (meaning this test can be used) for the duration of the COVID-19 declaration under Section 564(b)(1) of the Act, 21 U.S.C. section 360bbb-3(b)(1), unless the authorization is terminated or revoked.     Resp Syncytial Virus by PCR NEGATIVE NEGATIVE Final    Comment: (NOTE) Fact Sheet for Patients: EntrepreneurPulse.com.au  Fact Sheet for Healthcare Providers: IncredibleEmployment.be  This test is not yet approved or cleared by the [place] FDA and has been authorized for detection and/or diagnosis of SARS-CoV-2 by FDA under an Emergency Use Authorization (EUA). This EUA will remain in effect (meaning this test can be used) for the duration of the COVID-19 declaration under Section 564(b)(1) of the Act, 21 U.S.C. section 360bbb-3(b)(1), unless the authorization is terminated or revoked.  Performed at [facility], [place] [address] [place], [place] [address]   Urine Culture     Status:  Abnormal   Collection Time: 11/20/22  1:15 PM   Specimen: Urine, Catheterized  Result Value Ref Range Status   Specimen Description URINE, CATHETERIZED  Final   Special Requests   Final    NONE Performed at [facility], [place] [address], [place], [place] [address]    Culture (A)  Final    >=100,000 COLONIES/mL ESCHERICHIA COLI Confirmed Extended Spectrum Beta-Lactamase Producer (ESBL).  In  bloodstream infections from ESBL organisms, carbapenems are preferred over piperacillin/tazobactam. They are shown to have a lower risk of mortality.    Report Status 11/22/2022 FINAL  Final   Organism ID, Bacteria ESCHERICHIA COLI (A)  Final      Susceptibility   Escherichia coli - MIC*    AMPICILLIN >=32 RESISTANT Resistant     CEFAZOLIN >=64 RESISTANT Resistant     CEFEPIME 16 RESISTANT Resistant     CEFTRIAXONE >=64 RESISTANT Resistant     CIPROFLOXACIN >=4 RESISTANT Resistant     GENTAMICIN <=1 SENSITIVE Sensitive     IMIPENEM <=0.25 SENSITIVE Sensitive     NITROFURANTOIN <=16 SENSITIVE Sensitive     TRIMETH/SULFA >=320 RESISTANT Resistant     AMPICILLIN/SULBACTAM 4 SENSITIVE Sensitive     PIP/TAZO <=4 SENSITIVE Sensitive     * >=100,000 COLONIES/mL ESCHERICHIA COLI   Creatinine: Recent Labs    11/20/22 1858 11/20/22 2111 11/21/22 0331 11/21/22 0606 11/21/22 1006 11/22/22 0104 11/23/22 0136  CREATININE 0.83 0.94 0.83 0.72 0.72 0.63 0.52    Impression/Assessment:  -Renal mass-stable -Neurogenic bladder-manages with spontaneous voiding. -Bilateral hydronephrosis-chronic. -UTI - improving with abx  Plan:  She does not need any urologic intervention.  She has improved with IV fluids and antibiotics.  I discussed with the patient to consider Foley catheter and we went over some of the nature r/b/a.  She does have follow-up with Dr. Matilde Sprang December 01, 2022 at 2:30 PM.  She should keep this appointment.  Festus Aloe 11/23/2022, 8:06 PM

## 2022-11-24 DIAGNOSIS — A419 Sepsis, unspecified organism: Secondary | ICD-10-CM | POA: Diagnosis not present

## 2022-11-24 DIAGNOSIS — N390 Urinary tract infection, site not specified: Secondary | ICD-10-CM | POA: Diagnosis not present

## 2022-11-24 LAB — COMPREHENSIVE METABOLIC PANEL
ALT: 14 U/L (ref 0–44)
AST: 15 U/L (ref 15–41)
Albumin: 2.7 g/dL — ABNORMAL LOW (ref 3.5–5.0)
Alkaline Phosphatase: 71 U/L (ref 38–126)
Anion gap: 11 (ref 5–15)
BUN: 18 mg/dL (ref 8–23)
CO2: 34 mmol/L — ABNORMAL HIGH (ref 22–32)
Calcium: 8.5 mg/dL — ABNORMAL LOW (ref 8.9–10.3)
Chloride: 89 mmol/L — ABNORMAL LOW (ref 98–111)
Creatinine, Ser: 0.79 mg/dL (ref 0.44–1.00)
GFR, Estimated: >60 mL/min (ref >60)
Glucose, Bld: 108 mg/dL — ABNORMAL HIGH (ref 70–99)
Potassium: 3.7 mmol/L (ref 3.5–5.1)
Sodium: 134 mmol/L — ABNORMAL LOW (ref 135–145)
Total Bilirubin: 0.5 mg/dL (ref 0.3–1.2)
Total Protein: 5.9 g/dL — ABNORMAL LOW (ref 6.5–8.1)

## 2022-11-24 LAB — CBC WITH DIFFERENTIAL/PLATELET
Abs Immature Granulocytes: 0.01 10*3/uL (ref 0.00–0.07)
Basophils Absolute: 0.1 10*3/uL (ref 0.0–0.1)
Basophils Relative: 1 %
Eosinophils Absolute: 0.3 10*3/uL (ref 0.0–0.5)
Eosinophils Relative: 5 %
HCT: 35.4 % — ABNORMAL LOW (ref 36.0–46.0)
Hemoglobin: 12 g/dL (ref 12.0–15.0)
Immature Granulocytes: 0 %
Lymphocytes Relative: 31 %
Lymphs Abs: 2.2 10*3/uL (ref 0.7–4.0)
MCH: 30.5 pg (ref 26.0–34.0)
MCHC: 33.9 g/dL (ref 30.0–36.0)
MCV: 89.8 fL (ref 80.0–100.0)
Monocytes Absolute: 1.1 10*3/uL — ABNORMAL HIGH (ref 0.1–1.0)
Monocytes Relative: 16 %
Neutro Abs: 3.4 10*3/uL (ref 1.7–7.7)
Neutrophils Relative %: 47 %
Platelets: 244 10*3/uL (ref 150–400)
RBC: 3.94 MIL/uL (ref 3.87–5.11)
RDW: 13.1 % (ref 11.5–15.5)
WBC: 7.1 10*3/uL (ref 4.0–10.5)
nRBC: 0 % (ref 0.0–0.2)

## 2022-11-24 LAB — PHOSPHORUS: Phosphorus: 3.5 mg/dL (ref 2.5–4.6)

## 2022-11-24 LAB — MAGNESIUM: Magnesium: 1.9 mg/dL (ref 1.7–2.4)

## 2022-11-24 MED ORDER — ERTAPENEM IV (FOR PTA / DISCHARGE USE ONLY): 1.0000 g | INTRAVENOUS | 0 refills | Status: AC

## 2022-11-24 MED ORDER — TORSEMIDE 20 MG PO TABS: 20.0000 mg | ORAL_TABLET | ORAL | Status: DC

## 2022-11-24 MED ORDER — MIDODRINE HCL 2.5 MG PO TABS: 5.0000 mg | ORAL_TABLET | Freq: Three times a day (TID) | ORAL | Status: DC | PRN

## 2022-11-24 MED ORDER — MIDODRINE HCL 5 MG PO TABS: ORAL_TABLET | ORAL | 0 refills | Status: AC

## 2022-11-24 MED ORDER — METOPROLOL SUCCINATE ER 25 MG PO TB24: 25.0000 mg | ORAL_TABLET | Freq: Every day | ORAL | 0 refills | Status: DC

## 2022-11-24 NOTE — Progress Notes (Signed)
Pt taken off bipap and placed on 3L [place] at this time. Pt denies SOB, no increased WOB

## 2022-11-24 NOTE — Discharge Summary (Signed)
Physician Discharge Summary  Toni Barrowlizabeth G Veiga WUJ:[id number] DOB: 10/14/1945 DOA: 11/20/2022  PCP: Sherron Mondayejan-Sie, S Ahmed, MD  Admit date: 11/20/2022 Discharge date: 11/24/2022  Time spent: 40 minutes  Recommendations for Outpatient Follow-up:  Follow outpatient CBC/CMP within 1 week Follow with urology for neurogenic bladder, hydro, discuss risks/benefits foley - appt with Dr. Sherron MondayMacDiarmid April 12th at 2:30 Follow Blood pressures outpatient - weaning midodrine Follow afib outpatient, metoprolol increased to 25 mg daily with RVR, improved on day of discharge, follow outpatient Wait to resume corticotropin after completion of abx, follow with neurology for additional recs Follow volume status outpatient on torsemide, adjust as needed   Discharge Diagnoses:  Principal Problem:   Sepsis secondary to UTI Active Problems:   UTI (urinary tract infection)   Permanent atrial fibrillation   Hypothyroidism   HLD (hyperlipidemia)   Neurogenic urinary bladder disorder   Obesity hypoventilation syndrome (HCC)   Depression with anxiety   Obstructive sleep apnea treated with bilevel positive airway pressure (BiPAP)   Multiple sclerosis   Neurogenic hypoventilation   Chronic diastolic CHF (congestive heart failure)   Class 2 severe obesity with serious comorbidity and body mass index (BMI) of 35.0 to 35.9 in adult   Cushing's syndrome, ACTH dependent   Pressure injury of skin   Discharge Condition: stable  Diet recommendation: heart healthy  Filed Weights   11/20/22 1903 11/21/22 0520 11/22/22 0323  Weight: 90.7 kg 95.1 kg 92.6 kg    History of present illness:   77 y.o. 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  She's been diagnosed with  sepsis due to pyelo from ESBL UTI.   Discharging with ertapenem to complete at facility with midline in place.    See below with additional details  Hospital Course:  Assessment and Plan:  ESBL UTI  Concern for L Pyelonephritis Early sepsis > Patient presenting with fevers and dysuria.  Urinalysis with hemoglobin, leukocytes, bacteria.  Urine cultures pending.  Leukocytosis to 13.3.  In addition to this leukocytosis with urinary source patient noted to have tachycardia which does meet criteria for sepsis. - ESBL e. Coli on culture - renal US - with mild R and mild to moderate L hydro - will need outpatient follow up with urology (discussed case with them here) - plan to discharge on ertapenem with midline to complete 7 day abx course   Chronic combined diastolic and systolic CHF > Last echo was in 2023 with EF 45-50%, unable to determine diastolic function, normal RV function. - resume home torsemide - pulm venous congestion on 4/1 CXR - Continue home metoprolol - strict I/O, daily weights   Atrial fibrillation - RVR improved today, continue home metop at increased dose 25 mg daily - Home Eliquis   Hyponatremia Hypokalemia-resolved > On presentation patient noted to have sodium of 122 and potassium 3.2. > resolved at this time - follow on home torsemide - repeat BMP within 1 week    Multiple sclerosis Spasticity Neurogenic bladder Neurogenic hypoventilation > Patient with known history of MS with spasticity, neurogenic bladder and neurogenic hypoventilation.  Currently on baclofen pump for spasticity. - Continue with home baclofen pump - Treatment of hypoventilation as below   Hypotension Midodrine on home med list, but she only takes this as needed - has been scheduled past few days BP's improved, will taper this off  at discharge, then resume midodrine as she was taking before, as needed Continue metoprolol as noted above as well as torsemide  OSA OHS Obesity -  Continue home BiPAP nightly -BMI 37.14   ACTH dependent Cushing's syndrome - Continue home corticotropin, resume after completion of abx  Hypothyroidism - Continue home Synthroid   Depression - Continue home bupropion, escitalopram - Continue home Xanax      Procedures: none   Consultations: none  Discharge Exam: Vitals:   11/24/22 0919 11/24/22 1052  BP: (!) 128/101 106/69  Pulse: 77 63  Resp: 16 16  Temp: 97.7 F (36.5 C) 98 F (36.7 C)  SpO2: 97% 97%   No complaints  General: No acute distress. Cardiovascular: irregularly irregular Lungs: unlabored, diminished - on RA Abdomen: Soft, nontender, nondistended  Neurological: Alert and oriented 3. Moves all extremities 4 with equal strength. Cranial nerves II through XII grossly intact. Extremities: trace edema  Discharge Instructions   Discharge Instructions     [facility] pharmacist to adjust dose for Vancomycin, Aminoglycosides and other anti-infective therapies as requested by physician.   Complete by: As directed    [facility] to provide Cath Flo 2mg    Complete by: As directed    Administer for PICC line occlusion and as ordered by physician for other access device issues.   Anaphylaxis Kit: Provided to treat any anaphylactic reaction to the medication being provided to the patient if First Dose or when requested by physician   Complete by: As directed    Epinephrine 1mg /ml vial / amp: Administer 0.3mg  (0.65ml) subcutaneously once for moderate to severe anaphylaxis, nurse to call physician and pharmacy when reaction occurs and call 911 if needed for immediate care   Diphenhydramine 50mg /ml IV vial: Administer 25-50mg  IV/IM PRN for first dose reaction, rash, itching, mild reaction, nurse to call physician and pharmacy when reaction occurs   Sodium Chloride 0.9% NS IV: Administer if needed for hypovolemic blood pressure drop or as ordered by physician after call to physician with  anaphylactic reaction   Call MD for:  difficulty breathing, headache or visual disturbances   Complete by: As directed    Call MD for:  extreme fatigue   Complete by: As directed    Call MD for:  hives   Complete by: As directed    Call MD for:  persistant dizziness or light-headedness   Complete by: As directed    Call MD for:  persistant nausea and vomiting   Complete by: As directed    Call MD for:  redness, tenderness, or signs of infection (pain, swelling, redness, odor or green/yellow discharge around incision site)   Complete by: As directed    Call MD for:  severe uncontrolled pain   Complete by: As directed    Call MD for:  temperature >100.4   Complete by: As directed    Change dressing on IV access line weekly and PRN   Complete by: As directed    Diet - low sodium heart healthy   Complete by: As directed    Discharge instructions   Complete by: As directed    You were seen from sepsis from Shanetta Nicolls UTI.  You had Umar Patmon kidney infection due to ESBL e. Coli infection.  We've started you on ertapenem.  You'll complete this at your facility.    You should follow up with your outpatient urologist regarding your neurogenic bladder.  This is what puts you at risk for your recurrent UTI's.  Josaiah Muhammed discussion on the risk/benefits of Kersti Scavone foley catheter may be worth Hashem Goynes conversation as an outpatient.  Follow your hydronephrosis with the urologist outpatient.    Hold your ACTHAR until you complete your course of antibiotics.  Call your neurologist for additional instructions.    We increased your metoprolol for Sharon Rubis fast heart rate noted here.    You were started on midodrine.  I'll place recommendations for Cotey Rakes taper over the next few days.  After this is tapered off, you can resume taking it as you were before, as needed for low blood pressure.  Follow labs in Fatuma Dowers week or so to follow your kidney function and electrolytes.  Continue your torsemide at the current dose for now.  Follow repeat chest imaging  with your PCP outpatient.   Return for new, recurrent, or worsening symptoms.  Please ask your PCP to request records from this hospitalization so they know what was done and what the next steps will be.   Discharge wound care:   Complete by: As directed    Offloading, foam dressing   Flush IV access with Sodium Chloride 0.9% and Heparin 10 units/ml or 100 units/ml   Complete by: As directed    Home infusion instructions - [facility]   Complete by: As directed    Instructions: Flush IV access with Sodium Chloride 0.9% and Heparin 10units/ml or 100units/ml   Change dressing on IV access line: Weekly and PRN   Instructions Cath Flo 2mg : Administer for PICC Line occlusion and as ordered by physician for other access device   [facility] pharmacist to adjust dose for: Vancomycin, Aminoglycosides and other anti-infective therapies as requested by physician   Increase activity slowly   Complete by: As directed    Method of administration may be changed at the discretion of home infusion pharmacist based upon assessment of the patient and/or caregiver's ability to self-administer the medication ordered   Complete by: As directed    Outpatient Parenteral Antibiotic Therapy Information Antibiotic: Ertapenem (Invanz) IVPB; Indications for use: esbl ecoli uti; End Date: 11/28/2022   Complete by: As directed    Antibiotic: Ertapenem (Invanz) IVPB   Indications for use: esbl ecoli uti   End Date: 11/28/2022      Allergies as of 11/24/2022       Reactions   Other Hives, Rash   Topical Surgical prep  - severe rash, hives   Hydrocodone Nausea And Vomiting   Oxycontin [oxycodone] Nausea And Vomiting   Zoloft [sertraline] Hives, Swelling, Rash   Chloraseptic [phenol] Hives   Not documented on MAR   Macrobid [nitrofurantoin] Nausea And Vomiting   Not documented on MAR   Vioxx [rofecoxib] Other (See Comments)   Unknown reaction Not documented on MAR   Hibiclens [chlorhexidine  Gluconate] Hives, Rash, Other (See Comments)   Sores        Medication List     STOP taking these medications    senna-docusate 8.6-50 MG tablet Commonly known as: Senokot-S       TAKE these medications    acetaminophen 500 MG tablet Commonly known as: TYLENOL Take 1,000 mg by mouth every 8 (eight) hours as needed for fever or headache (pain).   Acthar 80 UNIT/ML injectable gel Generic drug: corticotropin Inject 1 mL (80 Units total) into the skin every other day for 10 days. May give additional doses PRN. What changed:  when to take this additional instructions   ALPRAZolam 0.5 MG tablet Commonly known as:  XANAX Take 1 tablet (0.5 mg total) by mouth at bedtime. May also take 1 tablet every 12 hours as needed anxiety What changed: additional instructions   Artificial Tears 1-0.3 % Soln Generic drug: Propylene Glycol-Glycerin Place 1-2 drops into both eyes See admin instructions. 2 entries on MAR: 1) 1 drop into each eye twice daily 2) 2 drops into each eye once daily as needed for dry eyes   baclofen 20 MG tablet Commonly known as: LIORESAL Take 20 mg by mouth daily as needed for muscle spasms.   Biotin 06269 MCG Tabs Take 10,000 mcg by mouth daily after lunch.   bisacodyl 10 MG suppository Commonly known as: DULCOLAX Place 10 mg rectally daily as needed (constipation).   buPROPion 150 MG 24 hr tablet Commonly known as: WELLBUTRIN XL Take 1 tablet (150 mg total) by mouth daily.   Calcium 500/D 500-10 MG-MCG Chew Generic drug: Calcium Carb-Cholecalciferol Chew 1 tablet by mouth 2 (two) times daily.   CeraVe Daily Moisturizing Lotn Apply 1 application  topically daily. To groin, inguinal area   Cerovite Senior Tabs Take 1 tablet by mouth daily.   cetirizine 10 MG tablet Commonly known as: ZYRTEC Take 10 mg by mouth daily as needed for allergies.   cholecalciferol 25 MCG (1000 UT) tablet Generic drug: Cholecalciferol Take 2,000 Units by mouth 2  (two) times daily with breakfast and lunch.   clotrimazole 1 % cream Commonly known as: LOTRIMIN Apply 1 Application topically 2 (two) times daily. Apply to neck folds.   D-Mannose 500 MG Caps Take 2,000 mg by mouth daily.   diclofenac Sodium 1 % Gel Commonly known as: Voltaren Apply 2 g topically 3 (three) times daily as needed. What changed:  reasons to take this additional instructions   diphenhydrAMINE 25 mg capsule Commonly known as: BENADRYL Take 25 mg by mouth 3 (three) times daily as needed (minor itching, irritation).   Eliquis 5 MG Tabs tablet Generic drug: apixaban Take 5 mg by mouth 2 (two) times daily.   ertapenem  IVPB Commonly known as: INVANZ Inject 1 g into the vein daily for 5 days. Indication:  ESBL ecoli UTI First Dose: No Last Day of Therapy:  11/28/2022 Labs - Once weekly:  CBC/D and BMP, Labs - Every other week:  ESR and CRP Method of administration: Mini-Bag Plus / Gravity Method of administration may be changed at the discretion of home infusion pharmacist based upon assessment of the patient and/or caregiver's ability to self-administer the medication ordered.   escitalopram 10 MG tablet Commonly known as: Lexapro Take 1 tablet (10 mg total) by mouth daily. What changed: when to take this   famotidine 20 MG tablet Commonly known as: PEPCID Take 20 mg by mouth at bedtime.   gabapentin 300 MG capsule Commonly known as: NEURONTIN Toni [name] will take 300 mg of gabapentin at 6 AM, 10 AM, 2 PM and 6 PM and 600 mg at 10 PM by mouth. What changed:  how much to take how to take this when to take this additional instructions   lactulose 10 GM/15ML solution Commonly known as: CHRONULAC Take 15 mLs (10 g total) by mouth 2 (two) times daily as needed for mild constipation. What changed: when to take this   levothyroxine 75 MCG tablet Commonly known as: SYNTHROID Take 75 mcg by mouth daily before breakfast.   melatonin 5 MG Tabs Take 5 mg by  mouth at bedtime.   METAMUCIL PO Take 1 packet by mouth 2 (two)  times daily. Metamucil 2.4g/5.4g powder packets.   metolazone 2.5 MG tablet Commonly known as: ZAROXOLYN Take 2.5 mg by mouth daily as needed (weight gain > 3 lbs in 2 days with swelling.).   metoprolol succinate 25 MG 24 hr tablet Commonly known as: TOPROL-XL Take 1 tablet (25 mg total) by mouth daily. Start taking on: November 25, 2022 What changed:  how much to take additional instructions   midodrine 5 MG tablet Commonly known as: PROAMATINE Take 1 tablet (5 mg total) by mouth 3 (three) times daily with meals for 1 day, THEN 0.5 tablets (2.5 mg total) 3 (three) times daily with meals for 1 day, THEN 0.5 tablets (2.5 mg total) 2 (two) times daily with Daegan Arizmendi meal for 1 day. Taper midodrine to off.  Then resuming using as needed for low blood pressure.. Start taking on: November 24, 2022 What changed: You were already taking Giulliana Mcroberts medication with the same name, and this prescription was added. Make sure you understand how and when to take each.   midodrine 2.5 MG tablet Commonly known as: PROAMATINE Take 2 tablets (5 mg total) by mouth 3 (three) times daily as needed (hypotension (systolic blood pressure less than 100)). What changed:  how much to take when to take this reasons to take this   Motegrity 2 MG Tabs Generic drug: Prucalopride Succinate Take 2 mg by mouth daily.   NONFORMULARY OR COMPOUNDED ITEM Apply 1 Application topically See admin instructions. 1:1 clotrimazole 1% and hydrocortisone 1%. Apply twice daily to perineal region and abdominal folds.   omeprazole 40 MG capsule Commonly known as: PRILOSEC Take 1 capsule (40 mg total) by mouth every morning.   ondansetron 4 MG disintegrating tablet Commonly known as: ZOFRAN-ODT Take 4 mg by mouth 4 (four) times daily.   phenazopyridine 100 MG tablet Commonly known as: PYRIDIUM Take 100 mg by mouth every 8 (eight) hours as needed (dysuria).   potassium chloride  SA 20 MEQ tablet Commonly known as: KLOR-CON M Take 2 tablets (40 mEq total) by mouth daily.   Preparation H 0.25-14-74.9 % rectal ointment Generic drug: phenylephrine-shark liver oil-mineral oil-petrolatum Place 1 Application rectally every 6 (six) hours as needed (rectum discomfort).   promethazine 12.5 MG tablet Commonly known as: PHENERGAN Take 12.5 mg by mouth every 8 (eight) hours as needed for nausea or vomiting.   senna 8.6 MG Tabs tablet Commonly known as: SENOKOT Take 17.2 mg by mouth 2 (two) times daily.   sodium phosphate 7-19 GM/118ML Enem Place 1 enema rectally daily as needed (constipation).   torsemide 20 MG tablet Commonly known as: DEMADEX Take 1-1.5 tablets (20-30 mg total) by mouth See admin instructions. 20 mg every morning, 30 mg in the evening.               Discharge Care Instructions  (From admission, onward)           Start     Ordered   11/24/22 0000  Change dressing on IV access line weekly and PRN  (Home infusion instructions - [facility] )        11/24/22 1135   11/24/22 0000  Discharge wound care:       Comments: Offloading, foam dressing   11/24/22 1146           Allergies  Allergen Reactions   Other Hives and Rash    Topical Surgical prep  - severe rash, hives   Hydrocodone Nausea And Vomiting   Oxycontin [Oxycodone] Nausea  And Vomiting   Zoloft [Sertraline] Hives, Swelling and Rash   Chloraseptic [Phenol] Hives    Not documented on MAR   Macrobid [Nitrofurantoin] Nausea And Vomiting    Not documented on MAR   Vioxx [Rofecoxib] Other (See Comments)    Unknown reaction Not documented on MAR   Hibiclens [Chlorhexidine Gluconate] Hives, Rash and Other (See Comments)    Sores    Follow-up Information     Alfredo Martinez, MD Follow up on 12/01/2022.   Specialty: Urology Why: at 2:30 PM Contact information: [address] [place] [address] [phone]                  The results of  significant diagnostics from this hospitalization (including imaging, microbiology, ancillary and laboratory) are listed below for reference.    Significant Diagnostic Studies: US RENAL  Result Date: 11/22/2022 CLINICAL DATA:  UTI EXAM: RENAL / URINARY TRACT ULTRASOUND COMPLETE COMPARISON:  CT 07/13/2022, CT 06/04/2020 FINDINGS: Right Kidney: Renal measurements: 11.8 by 5.1 x 5.8 cm = volume: 182.8 mL. Echogenicity is normal. No mass. Mild hydronephrosis. Left Kidney: Renal measurements: 11.3 x 5.6 x 5.1 cm = volume: 168.3 mL. Echogenicity is normal. No mass. Mild to moderate left hydronephrosis. Bladder: Appears normal for degree of bladder distention. Other: None. IMPRESSION: Mild right and mild to moderate left hydronephrosis, likely similar as compared with CT from November. Electronically Signed   By: Jasmine Pang M.D.   On: 11/22/2022 19:49   DG Chest Port 1 View  Result Date: 11/20/2022 CLINICAL DATA:  Fever. EXAM: PORTABLE CHEST 1 VIEW COMPARISON:  07/13/2022. FINDINGS: Unchanged cardiomegaly. Stable mediastinal contours. Pulmonary venous congestion. No focal airspace opacity. No pleural effusion or pneumothorax. IMPRESSION: Pulmonary venous congestion without overt pulmonary edema. No consolidation. Electronically Signed   By: Orvan Falconer M.D.   On: 11/20/2022 12:42    Microbiology: Recent Results (from the past 240 hour(s))  Resp panel by RT-PCR (RSV, Flu Claudis Giovanelli&B, Covid) Anterior Nasal Swab     Status: None   Collection Time: 11/20/22 11:53 AM   Specimen: Anterior Nasal Swab  Result Value Ref Range Status   SARS Coronavirus 2 by RT PCR NEGATIVE NEGATIVE Final   Influenza Haden Cavenaugh by PCR NEGATIVE NEGATIVE Final   Influenza B by PCR NEGATIVE NEGATIVE Final    Comment: (NOTE) The Xpert Xpress SARS-CoV-2/FLU/RSV plus assay is intended as an aid in the diagnosis of influenza from Nasopharyngeal swab specimens and should not be used as Hazley Dezeeuw sole basis for treatment. Nasal washings and aspirates are  unacceptable for Xpert Xpress SARS-CoV-2/FLU/RSV testing.  Fact Sheet for Patients: BloggerCourse.com  Fact Sheet for Healthcare Providers: SeriousBroker.it  This test is not yet approved or cleared by the [place] FDA and has been authorized for detection and/or diagnosis of SARS-CoV-2 by FDA under an Emergency Use Authorization (EUA). This EUA will remain in effect (meaning this test can be used) for the duration of the COVID-19 declaration under Section 564(b)(1) of the Act, 21 U.S.C. section 360bbb-3(b)(1), unless the authorization is terminated or revoked.     Resp Syncytial Virus by PCR NEGATIVE NEGATIVE Final    Comment: (NOTE) Fact Sheet for Patients: BloggerCourse.com  Fact Sheet for Healthcare Providers: SeriousBroker.it  This test is not yet approved or cleared by the [place] FDA and has been authorized for detection and/or diagnosis of SARS-CoV-2 by FDA under an Emergency Use Authorization (EUA). This EUA will remain in effect (meaning this test can be used) for the  duration of the COVID-19 declaration under Section 564(b)(1) of the Act, 21 U.S.C. section 360bbb-3(b)(1), unless the authorization is terminated or revoked.  Performed at [facility], [address] [place], [place] [address]   Urine Culture     Status: Abnormal   Collection Time: 11/20/22  1:15 PM   Specimen: Urine, Catheterized  Result Value Ref Range Status   Specimen Description URINE, CATHETERIZED  Final   Special Requests   Final    NONE Performed at [facility], [address], [place], [place] [address]    Culture (Yael Coppess)  Final    >=100,000 COLONIES/mL ESCHERICHIA COLI Confirmed Extended Spectrum Beta-Lactamase Producer (ESBL).  In bloodstream infections from ESBL organisms, carbapenems are preferred over piperacillin/tazobactam. They are shown to have Amilcar Reever lower risk of  mortality.    Report Status 11/22/2022 FINAL  Final   Organism ID, Bacteria ESCHERICHIA COLI (Lilyanah Celestin)  Final      Susceptibility   Escherichia coli - MIC*    AMPICILLIN >=32 RESISTANT Resistant     CEFAZOLIN >=64 RESISTANT Resistant     CEFEPIME 16 RESISTANT Resistant     CEFTRIAXONE >=64 RESISTANT Resistant     CIPROFLOXACIN >=4 RESISTANT Resistant     GENTAMICIN <=1 SENSITIVE Sensitive     IMIPENEM <=0.25 SENSITIVE Sensitive     NITROFURANTOIN <=16 SENSITIVE Sensitive     TRIMETH/SULFA >=320 RESISTANT Resistant     AMPICILLIN/SULBACTAM 4 SENSITIVE Sensitive     PIP/TAZO <=4 SENSITIVE Sensitive     * >=100,000 COLONIES/mL ESCHERICHIA COLI     Labs: Basic Metabolic Panel: Recent Labs  Lab 11/20/22 1156 11/20/22 1858 11/21/22 0606 11/21/22 1006 11/22/22 0104 11/23/22 0136 11/24/22 0050  NA 122*   < > 127* 128* 130* 133* 134*  K 3.2*   < > 4.0 3.5 3.6 3.7 3.7  CL 78*   < > 86* 85* 89* 90* 89*  CO2 34*   < > 28 33* 32 34* 34*  GLUCOSE 113*   < > 84 121* 85 103* 108*  BUN 23   < > CREATININE 0.99   < > 0.72 0.72 0.63 0.52 0.79  CALCIUM 7.9*   < > 8.3* 8.4* 8.3* 8.8* 8.5*  MG 1.9  --   --   --  1.9 2.1 1.9  PHOS  --   --   --   --   --  3.0 3.5   < > = values in this interval not displayed.   Liver Function Tests: Recent Labs  Lab 11/20/22 1156 11/24/22 0050  AST 16 15  ALT 8 14  ALKPHOS 60 71  BILITOT 1.0 0.5  PROT 5.5* 5.9*  ALBUMIN 2.8* 2.7*   No results for input(s): "LIPASE", "AMYLASE" in the last 168 hours. No results for input(s): "AMMONIA" in the last 168 hours. CBC: Recent Labs  Lab 11/20/22 1156 11/21/22 0331 11/22/22 0104 11/23/22 0136 11/24/22 0050  WBC 13.3* 11.4* 9.0 7.4 7.1  NEUTROABS  --   --   --   --  3.4  HGB 11.8* 12.2 11.1* 11.4* 12.0  HCT 34.7* 36.1 32.1* 33.3* 35.4*  MCV 90.8 90.7 90.2 89.8 89.8  PLT 162 147* 180 210 244   Cardiac Enzymes: No results for input(s): "CKTOTAL", "CKMB", "CKMBINDEX", "TROPONINI" in the  last 168 hours. BNP: BNP (last 3 results) Recent Labs    03/07/22 1957 11/22/22 0104  BNP 81.1 308.6*    ProBNP (last  3 results) No results for input(s): "PROBNP" in the last 8760 hours.  CBG: No results for input(s): "GLUCAP" in the last 168 hours.     Signed:  Lacretia Nicks MD.  [facility] 11/24/2022, 11:46 AM

## 2022-11-24 NOTE — TOC Transition Note (Signed)
Transition of Care [facility]) - CM/SW Discharge Note   Patient Details  Name: Toni [name] MRN: [id number] Date of Birth: [birth year]  Transition of Care [facility]) CM/SW Contact:  Eduard Roux, LCSW Phone Number: 11/24/2022, 2:26 PM   Clinical Narrative:     Patient will Discharge to: [facility] Discharge Date: 0405/2024 Family Notified: daughter Transport By: Sharin Mons  Per MD patient is ready for discharge. RN, patient, and facility notified of discharge. Discharge Summary sent to facility. RN given number for [phone],RM 14. Ambulance transport requested for patient.   Clinical Social Worker signing off.  Antony Blackbird, MSW, LCSW Clinical Social Worker    Final next level of care: Skilled Nursing Facility Barriers to Discharge: Barriers Resolved   Patient Goals and CMS Choice      Discharge Placement                Patient chooses bed at:  [facility]) Patient to be transferred to facility by: [facility] Name of family member notified: daughter Patient and family notified of of transfer: 11/24/22  Discharge Plan and Services Additional resources added to the After Visit Summary for   In-house Referral: Clinical Social Work                                   Social Determinants of Health (SDOH) Interventions SDOH Screenings   Food Insecurity: No Food Insecurity (07/14/2022)  Housing: Low Risk  (07/15/2022)  Transportation Needs: No Transportation Needs (07/15/2022)  Utilities: Not At Risk (07/15/2022)  Depression (PHQ2-9): Low Risk  (08/06/2018)  Tobacco Use: Medium Risk (11/20/2022)     Readmission Risk Interventions    06/11/2020    8:32 AM  Readmission Risk Prevention Plan  Transportation Screening Complete  PCP or Specialist appointment within 3-5 days of discharge Not Complete  PCP/Specialist Appt Not Complete comments MD office reports they have to call the pt and schedule this appt themselves when they receive notice of DC   HRI or Home Care Consult Complete  SW Recovery Care/Counseling Consult Complete  Palliative Care Screening Not Applicable  Skilled Nursing Facility Not Applicable

## 2022-11-24 NOTE — Progress Notes (Signed)
Called facility to give report to  [facility] . AVS printed and placed in packet with written scrip for IV antibiotics .   Everlean Cherry, RN

## 2022-11-26 ENCOUNTER — Encounter: Payer: Self-pay | Admitting: Internal Medicine

## 2022-12-19 ENCOUNTER — Encounter: Payer: Self-pay | Admitting: Family Medicine

## 2022-12-19 ENCOUNTER — Non-Acute Institutional Stay: Payer: Medicare Other | Admitting: Family Medicine

## 2022-12-19 VITALS — BP 98/56 | HR 72 | Temp 98.2°F | Resp 18

## 2022-12-19 DIAGNOSIS — E662 Morbid (severe) obesity with alveolar hypoventilation: Secondary | ICD-10-CM

## 2022-12-19 DIAGNOSIS — N319 Neuromuscular dysfunction of bladder, unspecified: Secondary | ICD-10-CM

## 2022-12-19 DIAGNOSIS — L89309 Pressure ulcer of unspecified buttock, unspecified stage: Secondary | ICD-10-CM

## 2022-12-19 NOTE — Progress Notes (Signed)
Therapist, nutritional Palliative Care Consult Note Telephone: [phone]  Fax: [phone]   Date of encounter: 12/19/22 11:51 AM PATIENT NAME: Toni [name] [name] [place] [address]   [phone] (home)  DOB: 05/18/1946 MRN: [id number] PRIMARY CARE PROVIDER:    Sherron Monday, MD,  [address] [place] [address] [phone]  REFERRING PROVIDER:   Sherron Monday, MD [address] [place],  [place] [address] [phone]  Health Care Agent/Health Care Power of Attorney:    Contact Information     Name Relation Home Work Mobile   Witz,Kristin Daughter [phone]  [phone]   Calo,Ronald Spouse [phone]  [phone]   Mila Merry [phone]  [phone]        I met face to face with patient and paid caregiver Verlon Au in [facility] [employer]. Palliative Care was asked to follow this patient by consultation request of Sherron Monday, MD to address advance care planning and complex medical decision making. This is a follow up visit.  ADVANCE CARE PLANNING/GOALS OF CARE: Health care surrogate gave her permission to discuss.   PRESENT FOR DISCUSSION: Joycelyn Man FNP-C and daughter Jeanette Caprice  GOALS: Full code per patient's wishes BARRIERS: patient is not ready to discuss comfort care   CODE STATUS: Full code   ASSESSMENT AND / RECOMMENDATIONS:  PPS: 40%  Neurogenic urinary bladder Foley cath to gravity drainage, recommend daily cath care, irrigation prn Monitor for s/sx of infection   Pressure injury of skin of buttocks, stage/laterality unspecified Continue current wound care per wound care team. Agree with use of foley to prevent infection/maceration and further breakdown. Encourage position change and use of pressure reduction surfaces for chair and bed. Albumin low at 2.7, recommend addition of prostat 30 ml daily to improve wound healing.  3.   Obesity  hypoventilation syndrome Noted hypercarbia with CO2 of 34 Encourage compliance with CPAP when sleeping.    Follow up Palliative Care Visit:  Palliative Care continuing to follow up by monitoring for changes in appetite, weight, functional and cognitive status for chronic disease progression and management in agreement with patient's stated goals of care. Next visit in 3-4 weeks or prn.  This visit was coded based on medical decision making (MDM).  Chief Complaint  Pt recently readmitted to SNF for rehab following hospitalization for UTI and hypotension.  HISTORY OF PRESENT ILLNESS:  Saran "Toni [name]" is a 77 y.o. year old female with multiple sclerosis. Pt states she had developed some skin breakdown on her buttocks.  Today is her anniversary. Denies pain, SOB, PND, CP.  Has had a foley cath placed to allow wound healing of her buttocks but is considering keeping in the catheter since she knows she empties her bladder completely and can do activities without worrying about getting to a bathroom. She is aware of an increased risk of infection but had had infection previously with no catheter in.  Has had longstanding nausea since Covid but requiring less Zofran.  No falls.  Intermittent constipation, recently had to have an enema and suppository to help.  Eating well with no coughing or choking.  Pt had UTI and hypotension, was admitted to the hospital in November 20, 2022 for 5 days.   ACTIVITIES OF DAILY LIVING: CONTINENT OF BLADDER/BOWEL? No, has foley BATHING/Dressing-requires assistance, feeding self with weighted utensils  MOBILITY:   WHEELCHAIR/BEDBOUND  APPETITE? Good Weight 12/18/22 was 197.6 lbs, 202.4 on 09/26/22  CURRENT PROBLEM LIST:  Patient Active  Problem List   Diagnosis Date Noted   Pressure injury of skin 11/21/2022   Sepsis secondary to UTI (HCC) 11/20/2022   (HFpEF) heart failure with preserved ejection fraction (HCC) 07/14/2022   Hyponatremia 07/13/2022   Cushing's  syndrome, ACTH dependent (HCC) 09/12/2021   Presence of intrathecal pump 08/18/2021   Spasticity 02/18/2021   Bladder spasms 01/04/2021   UTI (urinary tract infection) 01/03/2021   Bilateral carpal tunnel syndrome 10/04/2020   Multiple sclerosis, 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HCC) 09/13/2020   Chronic intermittent hypoxia with obstructive sleep apnea 09/13/2020   COVID-19 long hauler manifesting chronic dyspnea 09/13/2020   Chronic diastolic CHF (congestive heart failure) (HCC) 09/13/2020   Acute respiratory failure with hypoxia (HCC) 09/13/2020   Class 2 severe obesity with serious comorbidity and body mass index (BMI) of 35.0 to 35.9 in adult (HCC) 09/13/2020   Sepsis (HCC) 06/04/2020   Atrial fibrillation with RVR (HCC) 04/07/2020   Recurrent UTI 04/07/2020   Uncomplicated degenerative myopia of both eyes 03/19/2020   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/2020   Oxygen dependent 02/26/2020   Neurogenic hypoventilation 02/26/2020   Complex atypical endometrial hyperplasia    Acute on chronic respiratory failure with hypoxia (HCC) 10/10/2019   Multiple sclerosis (HCC) 10/10/2019   Bilateral leg weakness 08/04/2019   Weakness 08/04/2019   Unilateral primary osteoarthritis, left knee 01/09/2019   Obstructive sleep apnea treated with bilevel positive airway pressure (BiPAP) 08/15/2018   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) 11/21/2017   Permanent atrial fibrillation (HCC) 11/21/2017   Tightness of leg fascia 11/21/2017   Muscle spasm of both lower legs 11/21/2017   Carpal tunnel syndrome, left upper limb 09/13/2017   Carpal tunnel syndrome, right upper limb 09/13/2017   Other insomnia 06/11/2017   Depression with anxiety 06/11/2017   Vitamin D deficiency 03/22/2017   Right hand weakness 08/06/2016   Nonischemic cardiomyopathy (HCC) 05/09/2016   Localized edema 05/09/2016   Long term current use of anticoagulant therapy 02/03/2016   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   Obesity hypoventilation syndrome (HCC) 03/30/2015   Abnormality of gait 03/01/2015   Chronic constipation with overflow incontinence 01/25/2015   ACTH dependent Cushing's syndrome (HCC) 01/25/2015   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) 01/25/2015   Neurogenic urinary bladder disorder 08/24/2014   Varicose veins of both lower extremities with complications 06/04/2013   Pseudophakia of both eyes 02/07/2012   Multiple sclerosis, 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 11/26/2011   Hypothyroidism    HLD (hyperlipidemia)    PAST MEDICAL HISTORY:  Active Ambulatory Problems    Diagnosis Date Noted   Multiple sclerosis, 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 11/26/2011   Hypothyroidism    HLD (hyperlipidemia)    Varicose veins of both lower extremities with complications 06/04/2013   Neurogenic urinary bladder disorder 08/24/2014   Chronic constipation with overflow incontinence 01/25/2015   ACTH dependent Cushing's syndrome (HCC) 01/25/2015   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) 01/25/2015   Abnormality of gait 03/01/2015   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   Obesity hypoventilation syndrome (HCC) 03/30/2015   Long term current use of anticoagulant therapy 02/03/2016   Nonischemic cardiomyopathy (HCC) 05/09/2016   Localized edema 05/09/2016   Right hand weakness 08/06/2016   Vitamin D deficiency 03/22/2017   Other insomnia 06/11/2017   Depression with anxiety 06/11/2017   Carpal tunnel syndrome, left upper limb 09/13/2017   Carpal tunnel  syndrome, right upper limb 09/13/2017   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) 11/21/2017   Permanent atrial fibrillation (HCC) 11/21/2017   Tightness of leg fascia 11/21/2017   Muscle spasm of both lower legs 11/21/2017   Obstructive sleep apnea treated with bilevel  positive airway pressure (BiPAP) 08/15/2018   Unilateral primary osteoarthritis, left knee 01/09/2019   Bilateral leg weakness 08/04/2019   Weakness 08/04/2019   Acute on chronic respiratory failure with hypoxia (HCC) 10/10/2019   Multiple sclerosis (HCC) 10/10/2019   Complex atypical endometrial hyperplasia    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/2020   Oxygen dependent 02/26/2020   Neurogenic hypoventilation 02/26/2020   Atrial fibrillation with RVR (HCC) 04/07/2020   Recurrent UTI 04/07/2020   Sepsis (HCC) 06/04/2020   Multiple sclerosis, 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HCC) 09/13/2020   Chronic intermittent hypoxia with obstructive sleep apnea 09/13/2020   COVID-19 long hauler manifesting chronic dyspnea 09/13/2020   Chronic diastolic CHF (congestive heart failure) (HCC) 09/13/2020   Acute respiratory failure with hypoxia (HCC) 09/13/2020   Class 2 severe obesity with serious comorbidity and body mass index (BMI) of 35.0 to 35.9 in adult [facility]) 09/13/2020   Bilateral carpal tunnel syndrome 10/04/2020   Pseudophakia of both eyes 02/07/2012   Uncomplicated degenerative myopia of both eyes 03/19/2020   UTI (urinary tract infection) 01/03/2021   Bladder spasms 01/04/2021   Spasticity 02/18/2021   Presence of intrathecal pump 08/18/2021   Cushing's syndrome, ACTH dependent (HCC) 09/12/2021   Hyponatremia 07/13/2022   (HFpEF) heart failure with preserved ejection fraction (HCC) 07/14/2022   Sepsis secondary to UTI (HCC) 11/20/2022   Pressure injury of skin 11/21/2022   Resolved Ambulatory Problems    Diagnosis Date Noted   Aspiration pneumonia vs Failed outpatient Rx of CAP 11/26/2011   Essential hypertension    Atrial fibrillation (HCC)    Long term (current) use of anticoagulants 11/05/2012   Paroxysmal A-fib (HCC) 06/04/2013   Morbid obesity (HCC) 06/04/2013   OSA on CPAP 06/04/2013   Closed right ankle fracture 10/30/2013   Ankle fracture 10/30/2013   MS (multiple sclerosis) (HCC)  07/29/2014   Acute cystitis with hematuria 08/24/2014   Medication monitoring encounter 01/25/2015   Medication course changed 06/02/2015   Dysenteric diarrhea 10/04/2015   Chest pain 01/19/2016   Chronic atrial fibrillation (HCC) 01/19/2016   Combined systolic and diastolic congestive heart failure (HCC) 01/19/2016   Atrial fibrillation with RVR (HCC) 01/19/2016   Persistent atrial fibrillation with RVR (HCC) 01/19/2016   Chest pain at rest 02/03/2016   Atrial fibrillation with rapid ventricular response (HCC) 08/06/2016   Pressure injury of skin 08/06/2016   Dyspnea    Fever    Weakness 12/24/2016   Multiple sclerosis (HCC) 12/25/2016   Class 3 obesity with serious comorbidity and body mass index (BMI) of 40.0 to 44.9 in adult 03/22/2017   Prediabetes 03/22/2017   Central apnea 03/26/2017   Acute on chronic diastolic heart failure (HCC) 03/29/2017   Abdominal swelling 03/29/2017   Sleep apnea treated with nocturnal BiPAP 04/23/2018   Type 2 diabetes mellitus without complication, without long-term current use of insulin (HCC) 08/27/2018   Acute lower UTI 08/04/2019   Constipation    Acute hypotension 08/22/2019   COVID-19 virus infection 08/23/2019   HCAP (healthcare-associated pneumonia) 10/09/2019   SOB (shortness of breath) 10/10/2019   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HCC) 12/01/2019   Acute metabolic encephalopathy 06/04/2020   Cushing's syndrome, ACTH dependent (HCC) 09/13/2020   Past Medical History:  Diagnosis Date  Anxiety    Arthritis    Asthma    Back pain    Cancer (HCC)    CHF (congestive heart failure) (HCC)    Chronic pain    Depression    DVT (deep venous thrombosis) (HCC) 1983   Dysrhythmia    Edema    GERD (gastroesophageal reflux disease)    Hypertension    Hypotension    Joint pain    Osteoarthritis    PAF (paroxysmal atrial fibrillation) (HCC)    Pneumonia 11/2011   Sleep apnea    SOCIAL HX:  Social History   Tobacco Use    Smoking status: Former    Packs/day: 0.50    Years: 7.00    Additional pack years: 0.00    Total pack years: 3.50    Types: Cigarettes    Quit date: 08/21/1976    Years since quitting: 46.3   Smokeless tobacco: Never  Substance Use Topics   Alcohol use: Not Currently   FAMILY HX:  Family History  Problem Relation Age of Onset   Depression Mother    Sudden death Mother    Anxiety disorder Mother    Coronary artery disease Father        at age 17   Hyperlipidemia Father    Thyroid disease Father    Coronary artery disease Maternal Grandmother    Depression Maternal Grandmother    Cancer Paternal Grandmother        breast   Alcoholism Son    Sleep apnea Neg Hx    Multiple sclerosis Neg Hx        Preferred Pharmacy: [facility]:  Allergies  Allergen Reactions   Other Hives and Rash    Topical Surgical prep  - severe rash, hives   Hydrocodone Nausea And Vomiting   Oxycontin [Oxycodone] Nausea And Vomiting   Zoloft [Sertraline] Hives, Swelling and Rash   Chloraseptic [Phenol] Hives    Not documented on MAR   Macrobid [Nitrofurantoin] Nausea And Vomiting    Not documented on MAR   Vioxx [Rofecoxib] Other (See Comments)    Unknown reaction Not documented on MAR   Hibiclens [Chlorhexidine Gluconate] Hives, Rash and Other (See Comments)    Sores     PERTINENT MEDICATIONS:  Outpatient Encounter Medications as of 12/19/2022  Medication Sig   acetaminophen (TYLENOL) 500 MG tablet Take 1,000 mg by mouth every 8 (eight) hours as needed for fever or headache (pain).   ALPRAZolam (XANAX) 0.5 MG tablet Take 1 tablet (0.5 mg total) by mouth at bedtime. May also take 1 tablet every 12 hours as needed anxiety (Patient taking differently: Take 0.5 mg by mouth at bedtime.)   apixaban (ELIQUIS) 5 MG TABS tablet Take 5 mg by mouth 2 (two) times daily.   baclofen (LIORESAL) 20 MG tablet Take 20 mg by mouth daily as needed for muscle spasms.   Biotin 40981 MCG TABS Take 10,000 mcg by mouth  daily after lunch.   bisacodyl (DULCOLAX) 10 MG suppository Place 10 mg rectally daily as needed (constipation).   buPROPion (WELLBUTRIN XL) 150 MG 24 hr tablet Take 1 tablet (150 mg total) by mouth daily.   Calcium Carb-Cholecalciferol (CALCIUM 500/D) 500-10 MG-MCG CHEW Chew 1 tablet by mouth 2 (two) times daily.   cetirizine (ZYRTEC) 10 MG tablet Take 10 mg by mouth daily as needed for allergies.   cholecalciferol 25 MCG (1000 UT) tablet Take 2,000 Units by mouth 2 (two) times daily with breakfast and lunch.   clotrimazole (  LOTRIMIN) 1 % cream Apply 1 Application topically 2 (two) times daily. Apply to neck folds.   corticotropin (ACTHAR) 80 UNIT/ML injectable gel Inject 1 mL (80 Units total) into the skin every other day for 10 days. May give additional doses PRN. (Patient taking differently: Inject 80 Units into the skin See admin instructions. Inject 80 units as needed (about 5 times a month))   D-Mannose 500 MG CAPS Take 2,000 mg by mouth daily.   diclofenac Sodium (VOLTAREN) 1 % GEL Apply 2 g topically 3 (three) times daily as needed. (Patient taking differently: Apply 2 g topically 3 (three) times daily as needed (pain). To bilateral shoulders)   diphenhydrAMINE (BENADRYL) 25 mg capsule Take 25 mg by mouth 3 (three) times daily as needed (minor itching, irritation).   Emollient (CERAVE DAILY MOISTURIZING) LOTN Apply 1 application  topically daily. To groin, inguinal area   escitalopram (LEXAPRO) 10 MG tablet Take 1 tablet (10 mg total) by mouth daily. (Patient taking differently: Take 10 mg by mouth at bedtime.)   famotidine (PEPCID) 20 MG tablet Take 20 mg by mouth at bedtime.   gabapentin (NEURONTIN) 300 MG capsule Mrs. Regina will take 300 mg of gabapentin at 6 AM, 10 AM, 2 PM and 6 PM and 600 mg at 10 PM by mouth. (Patient taking differently: Take 300 mg by mouth See admin instructions. 300 mg 5 times daily at 0600, 1000, 1400, 1800 and 300-600 mg at bedtime.)   lactulose (CHRONULAC) 10  GM/15ML solution Take 15 mLs (10 g total) by mouth 2 (two) times daily as needed for mild constipation. (Patient taking differently: Take 10 g by mouth 2 (two) times daily.)   levothyroxine (SYNTHROID) 75 MCG tablet Take 75 mcg by mouth daily before breakfast.   melatonin 5 MG TABS Take 5 mg by mouth at bedtime.   metolazone (ZAROXOLYN) 2.5 MG tablet Take 2.5 mg by mouth daily as needed (weight gain > 3 lbs in 2 days with swelling.).   metoprolol succinate (TOPROL-XL) 25 MG 24 hr tablet Take 1 tablet (25 mg total) by mouth daily.   midodrine (PROAMATINE) 2.5 MG tablet Take 2 tablets (5 mg total) by mouth 3 (three) times daily as needed (hypotension (systolic blood pressure less than 100)).   Multiple Vitamins-Minerals (CEROVITE SENIOR) TABS Take 1 tablet by mouth daily.   NONFORMULARY OR COMPOUNDED ITEM Apply 1 Application topically See admin instructions. 1:1 clotrimazole 1% and hydrocortisone 1%. Apply twice daily to perineal region and abdominal folds.   omeprazole (PRILOSEC) 40 MG capsule Take 1 capsule (40 mg total) by mouth every morning.   ondansetron (ZOFRAN-ODT) 4 MG disintegrating tablet Take 4 mg by mouth 4 (four) times daily.   phenazopyridine (PYRIDIUM) 100 MG tablet Take 100 mg by mouth every 8 (eight) hours as needed (dysuria).   phenylephrine-shark liver oil-mineral oil-petrolatum (PREPARATION H) 0.25-14-74.9 % rectal ointment Place 1 Application rectally every 6 (six) hours as needed (rectum discomfort).   potassium chloride SA (KLOR-CON) 20 MEQ tablet Take 2 tablets (40 mEq total) by mouth daily.   promethazine (PHENERGAN) 12.5 MG tablet Take 12.5 mg by mouth every 8 (eight) hours as needed for nausea or vomiting.   Propylene Glycol-Glycerin (ARTIFICIAL TEARS) 1-0.3 % SOLN Place 1-2 drops into both eyes See admin instructions. 2 entries on MAR: 1) 1 drop into each eye twice daily 2) 2 drops into each eye once daily as needed for dry eyes   Prucalopride Succinate (MOTEGRITY) 2 MG  TABS Take 2 mg  by mouth daily.   Psyllium (METAMUCIL PO) Take 1 packet by mouth 2 (two) times daily. Metamucil 2.4g/5.4g powder packets.   senna (SENOKOT) 8.6 MG TABS tablet Take 17.2 mg by mouth 2 (two) times daily.   sodium phosphate (FLEET) 7-19 GM/118ML ENEM Place 1 enema rectally daily as needed (constipation).   torsemide (DEMADEX) 20 MG tablet Take 1-1.5 tablets (20-30 mg total) by mouth See admin instructions. 20 mg every morning, 30 mg in the evening.   No facility-administered encounter medications on file as of 12/19/2022.    History obtained from review of EMR, discussion with facility staff/paid caregiver and/or patient.      Latest Ref Rng & Units 11/24/2022   12:50 AM 11/23/2022    1:36 AM 11/22/2022    1:04 AM  CBC  WBC 4.0 - 10.5 K/uL 7.1  7.4  9.0   Hemoglobin 12.0 - 15.0 g/dL 16.1  09.6  04.5   Hematocrit 36.0 - 46.0 % 35.4  33.3  32.1   Platelets 150 - 400 K/uL 244  210  180        Latest Ref Rng & Units 11/24/2022   12:50 AM 11/23/2022    1:36 AM 11/22/2022    1:04 AM  CMP  Glucose 70 - 99 mg/dL 409  811  85   BUN 8 - 23 mg/dL 18  12  16    Creatinine 0.44 - 1.00 mg/dL 9.14  7.82  9.56   Sodium 135 - 145 mmol/L 134  133  130   Potassium 3.5 - 5.1 mmol/L 3.7  3.7  3.6   Chloride 98 - 111 mmol/L 89  90  89   CO2 22 - 32 mmol/L 34  34  32   Calcium 8.9 - 10.3 mg/dL 8.5  8.8  8.3   Total Protein 6.5 - 8.1 g/dL 5.9     Total Bilirubin 0.3 - 1.2 mg/dL 0.5     Alkaline Phos 38 - 126 U/L 71     AST 15 - 41 U/L 15     ALT 0 - 44 U/L 14          Latest Ref Rng & Units 11/24/2022   12:50 AM 11/20/2022   11:56 AM 07/13/2022    8:30 PM  Hepatic Function  Total Protein 6.5 - 8.1 g/dL 5.9  5.5  5.5   Albumin 3.5 - 5.0 g/dL 2.7  2.8  3.0   AST 15 - 41 U/L 15  16  20    ALT 0 - 44 U/L 14  8  7    Alk Phosphatase 38 - 126 U/L 71  60  63   Total Bilirubin 0.3 - 1.2 mg/dL 0.5  1.0  1.1      I reviewed available labs, medications, imaging, studies and related documents from the  EMR. Records reviewed.  Physical Exam: GENERAL: NAD LUNGS: CTAB, no increased work of breathing, room air CARDIAC:  S1S2, IRIR with LUSB murmur, BLE 2+ edema, wearing TEDs, No cyanosis ABD:  Normo-active BS x 4 quads, soft, non-tender EXTREMITIES: Strength equal in BLE and BUE, weaker in BLE NEURO:  Noted generalized weakness, no cognitive impairment PSYCH:  non-anxious affect, A & O x 3  Thank you for the opportunity to participate in the care of Ronan "Toni [name]" Warrens. Please call our main office at [phone] if we can be of additional assistance.    Joycelyn Man FNP-C  [facility]  Phone:  [phone]

## 2022-12-25 ENCOUNTER — Ambulatory Visit (INDEPENDENT_AMBULATORY_CARE_PROVIDER_SITE_OTHER): Payer: No Typology Code available for payment source | Admitting: Neurology

## 2022-12-25 ENCOUNTER — Encounter: Payer: Self-pay | Admitting: Neurology

## 2022-12-25 ENCOUNTER — Ambulatory Visit: Payer: Medicare Other | Admitting: Neurology

## 2022-12-25 DIAGNOSIS — Z993 Dependence on wheelchair: Secondary | ICD-10-CM

## 2022-12-25 DIAGNOSIS — R252 Cramp and spasm: Secondary | ICD-10-CM

## 2022-12-25 DIAGNOSIS — G35 Multiple sclerosis: Secondary | ICD-10-CM

## 2022-12-25 DIAGNOSIS — G8250 Quadriplegia, unspecified: Secondary | ICD-10-CM | POA: Diagnosis not present

## 2022-12-25 DIAGNOSIS — Z978 Presence of other specified devices: Secondary | ICD-10-CM

## 2022-12-25 NOTE — Progress Notes (Signed)
RM 11, with husband. Skin prepped with Betadine and draped in sterile fashion. Center port of implanted pump accessed using 22g huber needle.  Residual fluid measuring 12 ml was removed from the reservoir.  Gablofen 2094mcg/mlx2 instilled into pump. Pump reprogrammed to reflect a volume of 40ml.  ERI is 10/2027. Next appt given for 03/12/23 at 2:30pm.  Next alarm date: 03/26/2023. Gave appt card reminder to pt.   Medication placed in pump: Gablofen 40,031mcg,20ml x2. Lot: [id number]. Exp: 04/2025. NDC: [phone]

## 2022-12-25 NOTE — Progress Notes (Signed)
[facility]  PATIENT: Toni [name] DOB: [birth year]  REFERRING DOCTOR OR PCP: Dr. Vickey Huger SOURCE: Patient, notes from Dr. Vickey Huger and Dr. Anne Hahn  _________________________________   HISTORICAL  CHIEF COMPLAINT:  Chief Complaint  Patient presents with   Baclofen pump refill    HISTORY OF PRESENT ILLNESS:  Toni [name] is a 77 year old woman with multiple sclerosis.  .    She comes in today and would need a baclofen pump refill..  She had her pump replaced in 2022 (Dr. Venetia Maxon).     She is in [facility] in [place].   She she feels baseline today.  However, she had RSV a couple weeks ago and was more wiped out last week.  When she had a fever her weakness and spasticity was worse.   She does take p.o. baclofen as needed when spasticity is a little worse and she took a couple times last week.  She is written for the 20 mg pills and I discussed that if that makes her sleepy she can take half of the tablet several times a day.    She continues to get a benefit from the baclofen pump.  She is on third pump - first one 2009, last one July 2022).  The 2023, we increased the pump settings by 5% to 829.6 mcg/day.   That dose has worked out well for her.  She feels the current control her spasticity is doing well and is happy with the current pump settings and does not feel she needs to make any adjustment.  She is unable to walk but assist some with transfers.  She is able to move her legs in bed and to go on her side in bed.  She also notes weakness in her hands.  She has mild dysesthesias in her abdomen and legs.  Gabapentin has helped.     She has difficulty with a neurogenic bladder and has frequent UTIs.  Spasticity is worse when she is sick.  Vision is doing well. She wasreently hospitalized for UTI (not sepsis).     REVIEW OF SYSTEMS: Constitutional: No fevers, chills, sweats, or change in appetite Eyes: No visual changes, double vision, eye  pain Ear, nose and throat: No hearing loss, ear pain, nasal congestion, sore throat Cardiovascular: No chest pain, palpitations Respiratory:  No shortness of breath at rest or with exertion.   No wheezes GastrointestinaI: No nausea, vomiting, diarrhea, abdominal pain, fecal incontinence Genitourinary:  No dysuria, urinary retention or frequency.  No nocturia. Musculoskeletal:  No neck pain, back pain Integumentary: No rash, pruritus, skin lesions Neurological: as above Psychiatric: No depression at this time.  No anxiety Endocrine: No palpitations, diaphoresis, change in appetite, change in weigh or increased thirst Hematologic/Lymphatic:  No anemia, purpura, petechiae. Allergic/Immunologic: No itchy/runny eyes, nasal congestion, recent allergic reactions, rashes  ALLERGIES: Allergies  Allergen Reactions   Other Hives and Rash    Topical Surgical prep  - severe rash, hives   Hydrocodone Nausea And Vomiting   Oxycontin [Oxycodone] Nausea And Vomiting   Zoloft [Sertraline] Hives, Swelling and Rash   Chloraseptic [Phenol] Hives    Not documented on MAR   Macrobid [Nitrofurantoin] Nausea And Vomiting    Not documented on MAR   Vioxx [Rofecoxib] Other (See Comments)    Unknown reaction Not documented on MAR   Hibiclens [Chlorhexidine Gluconate] Hives, Rash and Other (See Comments)    Sores    HOME MEDICATIONS:  Current Outpatient Medications:    acetaminophen (TYLENOL)  500 MG tablet, Take 1,000 mg by mouth every 8 (eight) hours as needed for fever or headache (pain)., Disp: , Rfl:    ALPRAZolam (XANAX) 0.5 MG tablet, Take 1 tablet (0.5 mg total) by mouth at bedtime. May also take 1 tablet every 12 hours as needed anxiety (Patient taking differently: Take 0.5 mg by mouth at bedtime.), Disp: 10 tablet, Rfl: 0   apixaban (ELIQUIS) 5 MG TABS tablet, Take 5 mg by mouth 2 (two) times daily., Disp: , Rfl:    baclofen (LIORESAL) 20 MG tablet, Take 20 mg by mouth daily as needed for muscle  spasms., Disp: , Rfl:    Biotin [address] MCG TABS, Take 10,000 mcg by mouth daily after lunch., Disp: , Rfl:    bisacodyl (DULCOLAX) 10 MG suppository, Place 10 mg rectally daily as needed (constipation)., Disp: , Rfl:    buPROPion (WELLBUTRIN XL) 150 MG 24 hr tablet, Take 1 tablet (150 mg total) by mouth daily., Disp: 90 tablet, Rfl: 1   Calcium Carb-Cholecalciferol (CALCIUM 500/D) 500-10 MG-MCG CHEW, Chew 1 tablet by mouth 2 (two) times daily., Disp: , Rfl:    cetirizine (ZYRTEC) 10 MG tablet, Take 10 mg by mouth daily as needed for allergies., Disp: , Rfl:    cholecalciferol 25 MCG (1000 UT) tablet, Take 2,000 Units by mouth 2 (two) times daily with breakfast and lunch., Disp: , Rfl:    clotrimazole (LOTRIMIN) 1 % cream, Apply 1 Application topically 2 (two) times daily. Apply to neck folds., Disp: , Rfl:    corticotropin (ACTHAR) 80 UNIT/ML injectable gel, Inject 1 mL (80 Units total) into the skin every other day for 10 days. May give additional doses PRN. (Patient taking differently: Inject 80 Units into the skin See admin instructions. Inject 80 units as needed (about 5 times a month)), Disp: 8 mL, Rfl: 3   D-Mannose 500 MG CAPS, Take 2,000 mg by mouth daily., Disp: , Rfl:    diclofenac Sodium (VOLTAREN) 1 % GEL, Apply 2 g topically 3 (three) times daily as needed. (Patient taking differently: Apply 2 g topically 3 (three) times daily as needed (pain). To bilateral shoulders), Disp: 100 g, Rfl: 3   diphenhydrAMINE (BENADRYL) 25 mg capsule, Take 25 mg by mouth 3 (three) times daily as needed (minor itching, irritation)., Disp: , Rfl:    Emollient (CERAVE DAILY MOISTURIZING) LOTN, Apply 1 application  topically daily. To groin, inguinal area, Disp: , Rfl:    escitalopram (LEXAPRO) 10 MG tablet, Take 1 tablet (10 mg total) by mouth daily. (Patient taking differently: Take 10 mg by mouth at bedtime.), Disp: 90 tablet, Rfl: 1   famotidine (PEPCID) 20 MG tablet, Take 20 mg by mouth at bedtime., Disp: ,  Rfl:    fluticasone (FLONASE) 50 MCG/ACT nasal spray, Place 2 sprays into both nostrils daily., Disp: , Rfl:    gabapentin (NEURONTIN) 300 MG capsule, Mrs. Isenbarger will take 300 mg of gabapentin at 6 AM, 10 AM, 2 PM and 6 PM and 600 mg at 10 PM by mouth. (Patient taking differently: Take 300 mg by mouth See admin instructions. 300 mg 5 times daily at 0600, 1000, 1400, 1800 and 300-600 mg at bedtime.), Disp: 180 capsule, Rfl: 11   lactulose (CHRONULAC) 10 GM/15ML solution, Take 15 mLs (10 g total) by mouth 2 (two) times daily as needed for mild constipation. (Patient taking differently: Take 10 g by mouth 2 (two) times daily.), Disp: 236 mL, Rfl: 0   levothyroxine (SYNTHROID) 75 MCG tablet,  Take 75 mcg by mouth daily before breakfast., Disp: , Rfl:    melatonin 5 MG TABS, Take 5 mg by mouth at bedtime., Disp: , Rfl:    metolazone (ZAROXOLYN) 2.5 MG tablet, Take 2.5 mg by mouth daily as needed (weight gain > 3 lbs in 2 days with swelling.)., Disp: , Rfl:    metoprolol succinate (TOPROL-XL) 25 MG 24 hr tablet, Take 1 tablet (25 mg total) by mouth daily., Disp: 30 tablet, Rfl: 0   midodrine (PROAMATINE) 2.5 MG tablet, Take 2 tablets (5 mg total) by mouth 3 (three) times daily as needed (hypotension (systolic blood pressure less than 100))., Disp: , Rfl:    Multiple Vitamins-Minerals (CEROVITE SENIOR) TABS, Take 1 tablet by mouth daily., Disp: , Rfl:    NONFORMULARY OR COMPOUNDED ITEM, Apply 1 Application topically See admin instructions. 1:1 clotrimazole 1% and hydrocortisone 1%. Apply twice daily to perineal region and abdominal folds., Disp: , Rfl:    omeprazole (PRILOSEC) 40 MG capsule, Take 1 capsule (40 mg total) by mouth every morning., Disp: 30 capsule, Rfl: 11   ondansetron (ZOFRAN-ODT) 4 MG disintegrating tablet, Take 4 mg by mouth 4 (four) times daily., Disp: , Rfl:    oxybutynin (DITROPAN) 5 MG tablet, Take 5 mg by mouth 2 (two) times daily., Disp: , Rfl:    phenazopyridine (PYRIDIUM) 100 MG  tablet, Take 100 mg by mouth every 8 (eight) hours as needed (dysuria)., Disp: , Rfl:    phenylephrine-shark liver oil-mineral oil-petrolatum (PREPARATION H) 0.25-14-74.9 % rectal ointment, Place 1 Application rectally every 6 (six) hours as needed (rectum discomfort)., Disp: , Rfl:    potassium chloride SA (KLOR-CON) 20 MEQ tablet, Take 2 tablets (40 mEq total) by mouth daily., Disp: 180 tablet, Rfl: 3   promethazine (PHENERGAN) 12.5 MG tablet, Take 12.5 mg by mouth every 8 (eight) hours as needed for nausea or vomiting., Disp: , Rfl:    Propylene Glycol-Glycerin (ARTIFICIAL TEARS) 1-0.3 % SOLN, Place 1-2 drops into both eyes See admin instructions. 2 entries on MAR: 1) 1 drop into each eye twice daily 2) 2 drops into each eye once daily as needed for dry eyes, Disp: , Rfl:    Prucalopride Succinate (MOTEGRITY) 2 MG TABS, Take 2 mg by mouth daily., Disp: , Rfl:    Psyllium (METAMUCIL PO), Take 1 packet by mouth 2 (two) times daily. Metamucil 2.4g/5.4g powder packets., Disp: , Rfl:    senna (SENOKOT) 8.6 MG TABS tablet, Take 17.2 mg by mouth 2 (two) times daily., Disp: , Rfl:    sodium phosphate (FLEET) 7-19 GM/118ML ENEM, Place 1 enema rectally daily as needed (constipation)., Disp: , Rfl:    torsemide (DEMADEX) 20 MG tablet, Take 1-1.5 tablets (20-30 mg total) by mouth See admin instructions. 20 mg every morning, 30 mg in the evening., Disp: , Rfl:   PAST MEDICAL HISTORY: Past Medical History:  Diagnosis Date   Abnormality of gait 03/01/2015   Acute metabolic encephalopathy 06/04/2020   Anxiety    Arthritis    "knees" (01/19/2016)   Asthma    as a child    Atrial fibrillation (HCC)    Back pain    Bilateral carpal tunnel syndrome 10/04/2020   Cancer (HCC)    / RENAL CELL CARCINOMA - GETS CT SCAN EVERY 6 MONTHS TO WATCH    CHF (congestive heart failure) (HCC)    Chronic pain    "nerve pain from the MS"   Complex atypical endometrial hyperplasia    Constipation  COVID-19 virus  infection 08/23/2019   Depression    DVT (deep venous thrombosis) (HCC) 1983   "RLE; may have just been phlebitis; it was before the age of dopplers"   Dysrhythmia    afib    Edema    varicose veins with severe venous insuff in R and L GSV' ablation of R GSV 2012   GERD (gastroesophageal reflux disease)    HLD (hyperlipidemia)    Hypertension    Hypotension    Hypothyroidism    Joint pain    Multiple sclerosis (HCC)    has had this may 1989-[facility] reg doc   OSA on CPAP    bipap machine - SLEEP APNEA WORSE SINCE COVID IN 1/21 PER PATIENT SETTING HAS INCREASED FROM 9 TO 30    Osteoarthritis    PAF (paroxysmal atrial fibrillation) (HCC)    on coumadin; documented on monitor 06/2010 - no longer takes per [facility] 02/09/21   Pneumonia 11/2011   Sleep apnea    uses sleep apnea machine    PAST SURGICAL HISTORY: Past Surgical History:  Procedure Laterality Date   ANTERIOR CERVICAL DECOMP/DISCECTOMY FUSION  01/2004   CARDIAC CATHETERIZATION  06/22/2010   normal coronary arteries, PAF   CATARACT EXTRACTION, BILATERAL Bilateral    CHILD BIRTHS     X2   DILATATION & CURETTAGE/HYSTEROSCOPY WITH MYOSURE N/A 01/15/2020   Procedure: DILATATION & CURETTAGE/HYSTEROSCOPY WITH MYOSURE;  Surgeon: Richardean Chimera, MD;  Location: [facility];  Service: Gynecology;  Laterality: N/A;  pt in wheelchair-has MS   DILATION AND CURETTAGE OF UTERUS     DILATION AND CURETTAGE OF UTERUS N/A 02/16/2020   Procedure: DILATATION AND CURETTAGE;  Surgeon: Carver Fila, MD;  Location: [facility];  Service: Gynecology;  Laterality: N/A;  NOT GENERAL -NO INTUBATION   DNC     INFUSION PUMP IMPLANTATION  ~ 2009   baclofen infusion in lower abd   INTRATHECAL PUMP REVISION N/A 02/18/2021   Procedure: Baclofen Pump replacement with possible revision of catheter;  Surgeon: Maeola Harman, MD;  Location: [facility];  Service: Neurosurgery;  Laterality: N/A;  3C/RM 20   INTRAUTERINE DEVICE (IUD) INSERTION N/A 02/16/2020    Procedure: INTRAUTERINE DEVICE (IUD) INSERTION - MIRENA;  Surgeon: Carver Fila, MD;  Location: [facility];  Service: Gynecology;  Laterality: N/A;   IUD put in uterus     PAIN PUMP REVISION N/A 07/29/2014   Procedure: Baclofen pump replacement;  Surgeon: Maeola Harman, MD;  Location: [facility];  Service: Neurosurgery;  Laterality: N/A;  Baclofen pump replacement   TONSILLECTOMY AND ADENOIDECTOMY     VARICOSE VEIN SURGERY  ~ 1968   VENOUS ABLATION  12/16/2010   radiofreq ablation -Dr Lynnea Ferrier [facility]   WISDOM TOOTH EXTRACTION      FAMILY HISTORY: Family History  Problem Relation Age of Onset   Depression Mother    Sudden death Mother    Anxiety disorder Mother    Coronary artery disease Father        at age 61   Hyperlipidemia Father    Thyroid disease Father    Coronary artery disease Maternal Grandmother    Depression Maternal Grandmother    Cancer Paternal Grandmother        breast   Alcoholism Son    Sleep apnea Neg Hx    Multiple sclerosis Neg Hx     SOCIAL HISTORY:  Social History   Socioeconomic History   Marital status: Married    Spouse name:  [name]   Number of children: 2   Years of education: COLLEGE   Highest education level: Not on file  Occupational History   Occupation: RETIRED  Tobacco Use   Smoking status: Former    Packs/day: 0.50    Years: 7.00    Additional pack years: 0.00    Total pack years: 3.50    Types: Cigarettes    Quit date: 08/21/1976    Years since quitting: 46.3   Smokeless tobacco: Never  Vaping Use   Vaping Use: Never used  Substance and Sexual Activity   Alcohol use: Not Currently   Drug use: No   Sexual activity: Not Currently    [name] control/protection: Post-menopausal  Other Topics Concern   Not on file  Social History Narrative   Lives in [place] with Husband Windy Fast [phone], [phone]   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   Right handed   Drinks a little caffeine in coffee sometimes    Social Determinants of Health    Financial Resource Strain: Not on file  Food Insecurity: No Food Insecurity (07/14/2022)   Hunger Vital Sign    Worried About Running Out of Food in the Last Year: Never true    Ran Out of Food in the Last Year: Never true  Transportation Needs: No Transportation Needs (07/15/2022)   PRAPARE - Administrator, Civil Service (Medical): No    Lack of Transportation (Non-Medical): No  Physical Activity: Not on file  Stress: Not on file  Social Connections: Not on file  Intimate Partner Violence: Not At Risk (07/15/2022)   Humiliation, Afraid, Rape, and Kick questionnaire    Fear of Current or Ex-Partner: No    Emotionally Abused: No    Physically Abused: No    Sexually Abused: No     PHYSICAL EXAM  There were no vitals filed for this visit.    There is no height or weight on file to calculate BMI.   General: The patient is well-developed and well-nourished and in no acute distress  HEENT:  Head is [place]/AT.    Neurologic Exam  Mental status: The patient is alert and oriented x 3 at the time of the examination. The patient has apparent normal recent and remote memory, with an apparently normal attention span and concentration ability.   .  Cranial nerves: Extraocular movements are full.  Facial strength was normal  Motor:  Muscle bulk reduced at the thenar eminences.  Muscle tone was just mildly increased in the legs.  She has reduced grip bilaterally and 3/5 strength in the intrinsic hand muscles.  Strength is 3/5 in the left leg again 2+/5 in the right leg.  Sensory: Sensory testing is intact to touch x4.\  Gait and station: She is unable to stand or walk.   Reflexes: Deep tendon reflexes are symmetric in the arms and absent in the legs.  No ankle clonus.    DIAGNOSTIC DATA (LABS, IMAGING, TESTING) - I reviewed patient records, labs, notes, testing and imaging myself where available.  Lab Results  Component Value Date   WBC 7.1 11/24/2022   HGB 12.0  11/24/2022   HCT 35.4 (L) 11/24/2022   MCV 89.8 11/24/2022   PLT 244 11/24/2022      Component Value Date/Time   NA 134 (L) 11/24/2022 0050   NA 144 12/15/2020 1100   K 3.7 11/24/2022 0050   CL 89 (L) 11/24/2022 0050   CO2 34 (H) 11/24/2022 0050   GLUCOSE 108 (H) 11/24/2022  0050   BUN 18 11/24/2022 0050   BUN 27 12/15/2020 1100   CREATININE 0.79 11/24/2022 0050   CALCIUM 8.5 (L) 11/24/2022 0050   PROT 5.9 (L) 11/24/2022 0050   PROT 5.9 (L) 09/20/2020 1436   ALBUMIN 2.7 (L) 11/24/2022 0050   ALBUMIN 3.9 09/20/2020 1436   AST 15 11/24/2022 0050   ALT 14 11/24/2022 0050   ALKPHOS 71 11/24/2022 0050   BILITOT 0.5 11/24/2022 0050   BILITOT 0.3 09/20/2020 1436   GFRNONAA >60 11/24/2022 0050   GFRAA 90 09/20/2020 1436   Lab Results  Component Value Date   CHOL 111 03/19/2019   HDL 57 03/19/2019   LDLCALC 43 03/19/2019   TRIG 53 03/19/2019   CHOLHDL 3.0 04/22/2010   Lab Results  Component Value Date   HGBA1C 4.9 06/05/2020   No results found for: "VITAMINB12" Lab Results  Component Value Date   TSH 1.111 11/21/2022       ASSESSMENT AND PLAN  Multiple sclerosis (HCC)  Spastic quadriplegia (HCC)  Spasticity  Presence of intrathecal pump  Wheelchair dependent   She feels that her spasticity control is doing well.  The baclofen pump was refilled with baclofen at 2000 mg per ml.   Continue simple continuous rate of 829.6 mcg/day. She will return in about 3 months for next pump refill.   F/u with Dr. Vickey Huger for other MS / neuro issues  This visit is part of a comprehensive longitudinal care medical relationship regarding the patients primary diagnosis of multiple sclerosis and spastic quadriplegia and related concerns.  Leaner Morici A. Epimenio Foot, MD, [facility] 12/25/2022, 8:32 PM Certified in Neurology, Clinical Neurophysiology, Sleep Medicine and Neuroimaging  [facility] [address], [address] [place], [place] [address] [phone]

## 2023-01-02 ENCOUNTER — Ambulatory Visit: Payer: Medicare Other | Admitting: Neurology

## 2023-01-17 ENCOUNTER — Telehealth: Payer: Self-pay | Admitting: Neurology

## 2023-01-17 ENCOUNTER — Encounter: Payer: Self-pay | Admitting: Internal Medicine

## 2023-01-17 NOTE — Telephone Encounter (Signed)
Pt is asking for a call from Baird Lyons, [place] as she see's Dr Vickey Huger for her sleep issues and MS.  Pt is asking for a call to discuss sleep and headache issues.  Pt has scheduled a f/u to see Dr Vickey Huger and is on wait list.

## 2023-01-18 NOTE — Telephone Encounter (Signed)
Called the patient back. She was originally schedule may for follow up visit but was sick and unable to come in for apt. Was able to move up apt sooner.

## 2023-01-19 ENCOUNTER — Telehealth: Payer: Self-pay

## 2023-01-19 NOTE — Telephone Encounter (Signed)
Patient states she has been retaining fluid. The NP at her facility increased her torsemide for a couple of days and she lost 8lbs. When she went back to original dose the fluid came right back. She does not get weighed daily but she states she can see and feel the swelling. She wanted sooner appointment to be seen to discuss swelling and have labs. Made appointment with APP.

## 2023-01-23 LAB — LAB REPORT - SCANNED: EGFR: 87

## 2023-01-24 ENCOUNTER — Encounter: Payer: Self-pay | Admitting: Gynecologic Oncology

## 2023-01-25 NOTE — Telephone Encounter (Signed)
Per my last note, the patient can see OBGYN or me. Follow-up was going to be one year after her last visit. Was there a reason this visit got scheduled (is she having vaginal bleeding?)?

## 2023-01-26 ENCOUNTER — Telehealth: Payer: Self-pay | Admitting: Surgery

## 2023-01-26 NOTE — Telephone Encounter (Signed)
Called patient to let her know she does not need to come for June 14th appt unless she is having any current issues or concerns, since at her last visit Dr Pricilla Holm recommended yearly visits. Patient denies any concerns at this time and agreed to reschedule June appt to December for her yearly follow up. Rescheduled for Dec 20th at 2:30pm. Patient agreed to this date and time and had no other concerns.

## 2023-01-26 NOTE — Telephone Encounter (Signed)
Per Dr Pricilla Holm, called patient back to check if she has re-established care with an ob/gyn. Patient states she was previously a patient at [facility] but has not been back. Per Dr Pricilla Holm recommended patient re-establish care with her ob/gyn since she does not have a cancer diagnosis and no longer needs to see Dr Pricilla Holm for follow ups. Patient agreed to this and stated she would call her ob/gyn to reschedule for December.

## 2023-01-29 NOTE — Progress Notes (Unsigned)
Cardiology Clinic Note   Date: 01/30/2023 ID: EZZIE SENAT, DOB 11-23-1945, MRN [id number]  Primary Cardiologist:  Chrystie Nose, MD  Patient Profile    Toni [name] is a 77 y.o. female who presents to the clinic today for elevation of increased edema.  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 Echo 06/22/2022: EF 45 to 50%.  Global hypokinesis.  Mild LVH.  Diastolic function could not be evaluated.  Normal RV function.  Trivial MR.  EF decreased from prior study October 2021. Permanent A-fib. Cardiac event monitor 12/2014: A-fib burden 88%.  A-fib and a flutter with pauses up to 2.3 seconds. Hypotension. OSA. Hypothyroidism. MS. Hyperlipidemia. Chronic venous insufficiency. Venous ultrasound lower extremities 06/25/2019: Evidence of chronic venous insufficiency in the small saphenous vein and deep venous system on the right.  Evidence of chronic venous insufficiency in the deep venous system and great saphenous vein on the left.  No DVT bilaterally.   History of Present Illness    Toni [name] is a longtime patient of cardiology.  She is followed by Dr. Rennis Golden for the above outlined history.  Patient was last seen in the office by Dr. Rennis Golden on 09/14/2022 for routine follow-up.  She continued to have issues with heart failure and GI complaints.  She was not able to tolerate Entresto secondary to hypotension.  She has been doing well on torsemide but is now requiring midodrine to raise her BP.  Breathing was improved and weight was stable.  No changes were made.  Patient underwent hospital admission from 11/20/2022 and 11/24/2022 for urosepsis.  Patient contacted the office on 01/19/2023 with complaints of increased edema: "Patient states she has been retaining fluid. The NP at her facility increased her torsemide for a couple of days and she lost 8lbs. When she went back to original dose the fluid came  right back. She does not get weighed daily but she states she can see and feel the swelling. She wanted sooner appointment to be seen to discuss swelling and have labs. Made appointment with APP."  Today, patient is accompanied by her caregiver. She is a resident of [address]. She reports abdominal tightness indicative of holding onto fluid. It is difficult to determine her weight trends secondary to her weight being taken at different times and patient reports it is "always after breakfast." She denies shortness of breath but reports feeling as though she cannot get enough air in "like there is no space for air." She does not get much lower extremity edema secondary to wearing thigh high compression hose. The facility NP has been following patient for her increased edema. She increased her torsemide to 30 mg twice a day with resolution of symptoms but fluid returned. She had a second course of increased torsemide with resolution. She also took 1 dose of metolazone last week for the first time in quite some time.  No orthopnea or PND.  Last BNP on 01/23/2023 abnormal at 279.  Unfortunately I do not have the reading before so I am unsure if it is improved.  BMP showed stable kidney function with creatinine 0.72 and stable potassium at 4.2. BP has been consistently >100/60. Her last dose of midodrine was 12/16/2022.    ROS: All other systems reviewed and are otherwise negative except as noted in History of Present Illness.  Studies Reviewed    ECG is not ordered today.  Risk Assessment/Calculations     CHA2DS2-VASc Score =  4   This indicates a 4.8% annual risk of stroke. The patient's score is based upon: CHF History: 1 HTN History: 0 Diabetes History: 0 Stroke History: 0 Vascular Disease History: 0 Age Score: 2 Gender Score: 1             Physical Exam    VS:  BP (!) 94/58 (BP Location: Left Arm, Patient Position: Sitting, Cuff Size: Large)   Pulse (!) 50   Ht 5\' 5"  (1.651 m)    Wt 198 lb 9.6 oz (90.1 kg)   SpO2 98%   BMI 33.05 kg/m  , BMI Body mass index is 33.05 kg/m.  GEN: Well nourished, well developed, in no acute distress. Neck: No JVD or carotid bruits. Cardiac: Irregular rhythm, controlled rate.. No murmurs. No rubs or gallops.   Respiratory:  Respirations regular and unlabored. Clear to auscultation without rales, wheezing or rhonchi. GI: Soft, nontender, nondistended. Extremities: Radials/DP/PT 2+ and equal bilaterally. No clubbing or cyanosis.  Moderate lower extremity edema with compression hose in place. Skin: Warm and dry, no rash. Neuro: Strength intact.  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  Echo November 2023 showed EF 45 to 50%, global hypokinesis, mild LVH.  Patient reports a several week history of increased abdominal fullness/tightness. She had 2 courses of increased at 30 mg twice daily with good resolution of symptoms.  However edema returned.  1 dose of metolazone last week for the first time in quite a while and has not had any since.  It is difficult to determine weight trends secondary to very times of weight being obtained and patient reports it is "always after breakfast."  No increased lower extremity edema secondary to thigh-high compression socks.  She reports brisk diuresis (in [place]) with torsemide.  She denies shortness of breath but does report feeling as though she is not getting good breath in like "there is no space for air."  No orthopnea or PND.  She is compliant with CPAP.  Torsemide to 30 mg twice daily.  Mild to moderate lower extremity edema with compression hose in place.  Abdomen soft.  Lungs clear to auscultation.  Continue metoprolol,  metolazone as needed.  Patient unable to tolerate Entresto secondary to hypotension. Permanent A-fib.  Patient potation's or feeling as though her heart is racing.  Denies spontaneous bleeding  concerns.  Irregular rhythm, controlled rate on auscultation today.  Continue to Toprol and Eliquis. Hypotension.  BP today 94/58.  BP has been consistently >100/60 per nursing home readings.  Her last dose of midodrine was 12/16/2022.  Continue midodrine as needed.  Disposition: Increase torsemide to 30 mg twice daily.  Patient needs to move her visit with Dr. Rennis Golden in July as she is scheduled to have her baclofen pump refilled.  Will have patient follow-up with Dr. Rennis Golden in 4 to 5 months or sooner as needed.         Signed, Etta Grandchild. Noora Locascio, DNP, NP-C

## 2023-01-30 ENCOUNTER — Ambulatory Visit: Payer: Medicare Other | Attending: Student | Admitting: Student

## 2023-01-30 ENCOUNTER — Encounter: Payer: Self-pay | Admitting: Student

## 2023-01-30 VITALS — BP 94/58 | HR 50 | Ht 65.0 in | Wt 198.6 lb

## 2023-01-30 DIAGNOSIS — I4821 Permanent atrial fibrillation: Secondary | ICD-10-CM | POA: Diagnosis present

## 2023-01-30 DIAGNOSIS — I5042 Chronic combined systolic (congestive) and diastolic (congestive) heart failure: Secondary | ICD-10-CM | POA: Insufficient documentation

## 2023-01-30 DIAGNOSIS — I959 Hypotension, unspecified: Secondary | ICD-10-CM | POA: Diagnosis present

## 2023-01-30 MED ORDER — TORSEMIDE 20 MG PO TABS: 30.0000 mg | ORAL_TABLET | Freq: Two times a day (BID) | ORAL | 3 refills | Status: DC

## 2023-01-30 NOTE — Patient Instructions (Signed)
Medication Instructions:  Your physician has recommended you make the following change in your medication:  INCREASE: Torsemide 30mg  Twice daily.  *If you need a refill on your cardiac medications before your next appointment, please call your pharmacy*   Lab Work: NONE If you have labs (blood work) drawn today and your tests are completely normal, you will receive your results only by: MyChart Message (if you have MyChart) OR A paper copy in the mail If you have any lab test that is abnormal or we need to change your treatment, we will call you to review the results.   Testing/Procedures: NONE   Follow-Up: At [facility], you and your health needs are our priority.  As part of our continuing mission to provide you with exceptional heart care, we have created designated Provider Care Teams.  These Care Teams include your primary Cardiologist (physician) and Advanced Practice Providers (APPs -  Physician Assistants and Nurse Practitioners) who all work together to provide you with the care you need, when you need it.  We recommend signing up for the patient portal called "MyChart".  Sign up information is provided on this After Visit Summary.  MyChart is used to connect with patients for Virtual Visits (Telemedicine).  Patients are able to view lab/test results, encounter notes, upcoming appointments, etc.  Non-urgent messages can be sent to your provider as well.   To learn more about what you can do with MyChart, go to [email or web address].    Your next appointment:   4-5 month(s)  Provider:   Chrystie Nose, MD

## 2023-02-02 ENCOUNTER — Ambulatory Visit: Payer: Medicare Other | Admitting: Gynecologic Oncology

## 2023-02-07 ENCOUNTER — Non-Acute Institutional Stay: Payer: Medicare Other | Admitting: Family Medicine

## 2023-02-07 NOTE — Progress Notes (Deleted)
Therapist, nutritional Palliative Care Consult Note Telephone: [phone]  Fax: [phone]   Date of encounter: 02/07/23 9:27 AM PATIENT NAME: Toni [name] [name] [place] [address]   [phone] (home)  DOB: 25-Oct-1945 MRN: [id number] PRIMARY CARE PROVIDER:    Sherron Monday, [name],  [address] [place] [address] [phone]  REFERRING PROVIDER:   Sherron Monday, [name] [address] [place],  [place] [address] [phone]  Health Care Agent/Health Care Power of Attorney:    Contact Information     Name Relation Home Work Mobile   Toni [name] Daughter [phone]  [phone]   Toni [name] Spouse [phone]  [phone]   Toni [name] [phone]  [phone]        I met face to face with patient and paid caregiver Toni [name] in [facility]. Palliative Care was asked to follow this patient by consultation request of Toni [name] to address advance care planning and complex medical decision making. This is a follow up visit.  ADVANCE CARE PLANNING/GOALS OF CARE: Health care surrogate gave her permission to discuss.   PRESENT FOR DISCUSSION: Toni [name] and daughter Toni [name]  GOALS: Full code per patient's wishes BARRIERS: patient is not ready to discuss comfort care   CODE STATUS: Full code   ASSESSMENT AND / RECOMMENDATIONS:  PPS: 40%  Neurogenic urinary bladder Foley cath to gravity drainage, recommend daily cath care, irrigation prn Monitor for s/sx of infection   Pressure injury of skin of buttocks, stage/laterality unspecified Continue current wound care per wound care team. Agree with use of foley to prevent infection/maceration and further breakdown. Encourage position change and use of pressure reduction surfaces for chair and bed. Albumin low at 2.7, recommend addition of prostat 30 ml daily to improve wound healing.  3.   Obesity  hypoventilation syndrome Noted hypercarbia with CO2 of 34 Encourage compliance with CPAP when sleeping.    Follow up Palliative Care Visit:  Palliative Care continuing to follow up by monitoring for changes in appetite, weight, functional and cognitive status for chronic disease progression and management in agreement with patient's stated goals of care. Next visit in 3-4 weeks or prn.  This visit was coded based on medical decision making (MDM).  Chief Complaint  Pt recently readmitted to SNF for rehab following hospitalization for UTI and hypotension.  HISTORY OF PRESENT ILLNESS:  Toni "Toni [name]" is a 77 y.o. year old female with multiple sclerosis. Pt states she had developed some skin breakdown on her buttocks.  Today is her anniversary. Denies pain, SOB, PND, CP.  Has had a foley cath placed to allow wound healing of her buttocks but is considering keeping in the catheter since she knows she empties her bladder completely and can do activities without worrying about getting to a bathroom. She is aware of an increased risk of infection but had had infection previously with no catheter in.  Has had longstanding nausea since Covid but requiring less Zofran.  No falls.  Intermittent constipation, recently had to have an enema and suppository to help.  Eating well with no coughing or choking.  Pt had UTI and hypotension, was admitted to the hospital in November 20, 2022 for 5 days.   ACTIVITIES OF DAILY LIVING: CONTINENT OF BLADDER/BOWEL? No, has foley BATHING/Dressing-requires assistance, feeding self with weighted utensils  MOBILITY:   WHEELCHAIR/BEDBOUND  APPETITE? 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   Wheelchair dependent 12/25/2022   Pressure injury of skin 11/21/2022   Sepsis secondary to UTI (HCC) 11/20/2022   (HFpEF) heart failure with preserved  ejection fraction (HCC) 07/14/2022   Hyponatremia 07/13/2022   Cushing's syndrome, ACTH dependent (HCC) 09/12/2021   Presence of intrathecal pump 08/18/2021   Spasticity 02/18/2021   Bladder spasms 01/04/2021   UTI (urinary tract infection) 01/03/2021   Bilateral carpal tunnel syndrome 10/04/2020   Multiple sclerosis, 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HCC) 09/13/2020   Chronic intermittent hypoxia with obstructive sleep apnea 09/13/2020   COVID-19 long hauler manifesting chronic dyspnea 09/13/2020   Chronic diastolic CHF (congestive heart failure) (HCC) 09/13/2020   Acute respiratory failure with hypoxia (HCC) 09/13/2020   Class 2 severe obesity with serious comorbidity and body mass index (BMI) of 35.0 to 35.9 in adult (HCC) 09/13/2020   Sepsis (HCC) 06/04/2020   Atrial fibrillation with RVR (HCC) 04/07/2020   Recurrent UTI 04/07/2020   Uncomplicated degenerative myopia of both eyes 03/19/2020   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/2020   Oxygen dependent 02/26/2020   Neurogenic hypoventilation 02/26/2020   Complex atypical endometrial hyperplasia    Acute on chronic respiratory failure with hypoxia (HCC) 10/10/2019   Multiple sclerosis (HCC) 10/10/2019   Bilateral leg weakness 08/04/2019   Weakness 08/04/2019   Unilateral primary osteoarthritis, left knee 01/09/2019   Obstructive sleep apnea treated with bilevel positive airway pressure (BiPAP) 08/15/2018   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) 11/21/2017   Permanent atrial fibrillation (HCC) 11/21/2017   Tightness of leg fascia 11/21/2017   Muscle spasm of both lower legs 11/21/2017   Carpal tunnel syndrome, left upper limb 09/13/2017   Carpal tunnel syndrome, right upper limb 09/13/2017   Other insomnia 06/11/2017   Depression with anxiety 06/11/2017   Vitamin D deficiency 03/22/2017   Right hand weakness 08/06/2016   Nonischemic cardiomyopathy (HCC) 05/09/2016   Localized edema 05/09/2016   Long term current use  of anticoagulant therapy 02/03/2016   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   Obesity hypoventilation syndrome (HCC) 03/30/2015   Abnormality of gait 03/01/2015   Chronic constipation with overflow incontinence 01/25/2015   ACTH dependent Cushing's syndrome (HCC) 01/25/2015   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) 01/25/2015   Neurogenic urinary bladder disorder 08/24/2014   Varicose veins of both lower extremities with complications 06/04/2013   Pseudophakia of both eyes 02/07/2012   Multiple sclerosis, 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 11/26/2011   Hypothyroidism    HLD (hyperlipidemia)    PAST MEDICAL HISTORY:  Active Ambulatory Problems    Diagnosis Date Noted   Multiple sclerosis, 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 11/26/2011   Hypothyroidism    HLD (hyperlipidemia)    Varicose veins of both lower extremities with complications 06/04/2013   Neurogenic urinary bladder disorder 08/24/2014   Chronic constipation with overflow incontinence 01/25/2015   ACTH dependent Cushing's syndrome (HCC) 01/25/2015   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) 01/25/2015   Abnormality of gait 03/01/2015   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   Obesity hypoventilation syndrome (HCC) 03/30/2015   Long term current use of anticoagulant therapy 02/03/2016   Nonischemic cardiomyopathy (HCC) 05/09/2016   Localized edema 05/09/2016   Right hand weakness 08/06/2016   Vitamin D deficiency 03/22/2017   Other insomnia 06/11/2017   Depression with anxiety 06/11/2017  Carpal tunnel syndrome, left upper limb 09/13/2017   Carpal tunnel syndrome, right upper limb 09/13/2017   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) 11/21/2017   Permanent atrial fibrillation (HCC) 11/21/2017   Tightness of leg fascia 11/21/2017   Muscle spasm of both  lower legs 11/21/2017   Obstructive sleep apnea treated with bilevel positive airway pressure (BiPAP) 08/15/2018   Unilateral primary osteoarthritis, left knee 01/09/2019   Bilateral leg weakness 08/04/2019   Weakness 08/04/2019   Acute on chronic respiratory failure with hypoxia (HCC) 10/10/2019   Multiple sclerosis (HCC) 10/10/2019   Complex atypical endometrial hyperplasia    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/2020   Oxygen dependent 02/26/2020   Neurogenic hypoventilation 02/26/2020   Atrial fibrillation with RVR (HCC) 04/07/2020   Recurrent UTI 04/07/2020   Sepsis (HCC) 06/04/2020   Multiple sclerosis, 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HCC) 09/13/2020   Chronic intermittent hypoxia with obstructive sleep apnea 09/13/2020   COVID-19 long hauler manifesting chronic dyspnea 09/13/2020   Chronic diastolic CHF (congestive heart failure) (HCC) 09/13/2020   Acute respiratory failure with hypoxia (HCC) 09/13/2020   Class 2 severe obesity with serious comorbidity and body mass index (BMI) of 35.0 to 35.9 in adult [facility]) 09/13/2020   Bilateral carpal tunnel syndrome 10/04/2020   Pseudophakia of both eyes 02/07/2012   Uncomplicated degenerative myopia of both eyes 03/19/2020   UTI (urinary tract infection) 01/03/2021   Bladder spasms 01/04/2021   Spasticity 02/18/2021   Presence of intrathecal pump 08/18/2021   Cushing's syndrome, ACTH dependent (HCC) 09/12/2021   Hyponatremia 07/13/2022   (HFpEF) heart failure with preserved ejection fraction (HCC) 07/14/2022   Sepsis secondary to UTI (HCC) 11/20/2022   Pressure injury of skin 11/21/2022   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/25/2022   Wheelchair dependent 12/25/2022   Resolved Ambulatory Problems    Diagnosis Date Noted   Aspiration pneumonia vs Failed outpatient Rx of CAP 11/26/2011   Essential hypertension    Atrial fibrillation (HCC)    Long term (current) use of anticoagulants 11/05/2012   Paroxysmal A-fib (HCC) 06/04/2013   Morbid obesity (HCC)  06/04/2013   OSA on CPAP 06/04/2013   Closed right ankle fracture 10/30/2013   Ankle fracture 10/30/2013   MS (multiple sclerosis) (HCC) 07/29/2014   Acute cystitis with hematuria 08/24/2014   Medication monitoring encounter 01/25/2015   Medication course changed 06/02/2015   Dysenteric diarrhea 10/04/2015   Chest pain 01/19/2016   Chronic atrial fibrillation (HCC) 01/19/2016   Combined systolic and diastolic congestive heart failure (HCC) 01/19/2016   Atrial fibrillation with RVR (HCC) 01/19/2016   Persistent atrial fibrillation with RVR (HCC) 01/19/2016   Chest pain at rest 02/03/2016   Atrial fibrillation with rapid ventricular response (HCC) 08/06/2016   Pressure injury of skin 08/06/2016   Dyspnea    Fever    Weakness 12/24/2016   Multiple sclerosis (HCC) 12/25/2016   Class 3 obesity with serious comorbidity and body mass index (BMI) of 40.0 to 44.9 in adult 03/22/2017   Prediabetes 03/22/2017   Central apnea 03/26/2017   Acute on chronic diastolic heart failure (HCC) 03/29/2017   Abdominal swelling 03/29/2017   Sleep apnea treated with nocturnal BiPAP 04/23/2018   Type 2 diabetes mellitus without complication, without long-term current use of insulin (HCC) 08/27/2018   Acute lower UTI 08/04/2019   Constipation    Acute hypotension 08/22/2019   COVID-19 virus infection 08/23/2019   HCAP (healthcare-associated pneumonia) 10/09/2019   SOB (shortness of breath) 10/10/2019   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HCC) 12/01/2019  Acute metabolic encephalopathy 06/04/2020   Cushing's syndrome, ACTH dependent (HCC) 09/13/2020   Past Medical History:  Diagnosis Date   Anxiety    Arthritis    Asthma    Back pain    Cancer (HCC)    CHF (congestive heart failure) (HCC)    Chronic pain    Depression    DVT (deep venous thrombosis) (HCC) 1983   Dysrhythmia    Edema    GERD (gastroesophageal reflux disease)    Hypertension    Hypotension    Joint pain    Osteoarthritis     PAF (paroxysmal atrial fibrillation) (HCC)    Pneumonia 11/2011   Sleep apnea    SOCIAL HX:  Social History   Tobacco Use   Smoking status: Former    Packs/day: 0.50    Years: 7.00    Additional pack years: 0.00    Total pack years: 3.50    Types: Cigarettes    Quit date: 08/21/1976    Years since quitting: 46.4   Smokeless tobacco: Never  Substance Use Topics   Alcohol use: Not Currently   FAMILY HX:  Family History  Problem Relation Age of Onset   Depression Mother    Sudden death Mother    Anxiety disorder Mother    Coronary artery disease Father        at age 64   Hyperlipidemia Father    Thyroid disease Father    Coronary artery disease Maternal Grandmother    Depression Maternal Grandmother    Cancer Paternal Grandmother        breast   Alcoholism Son    Sleep apnea Neg Hx    Multiple sclerosis Neg Hx        Preferred Pharmacy: [facility]:  Allergies  Allergen Reactions   Other Hives and Rash    Topical Surgical prep  - severe rash, hives   Hydrocodone Nausea And Vomiting   Oxycontin [Oxycodone] Nausea And Vomiting   Zoloft [Sertraline] Hives, Swelling and Rash   Chloraseptic [Phenol] Hives    Not documented on MAR   Macrobid [Nitrofurantoin] Nausea And Vomiting    Not documented on MAR   Vioxx [Rofecoxib] Other (See Comments)    Unknown reaction Not documented on MAR   Hibiclens [Chlorhexidine Gluconate] Hives, Rash and Other (See Comments)    Sores     PERTINENT MEDICATIONS:  Outpatient Encounter Medications as of 02/07/2023  Medication Sig   acetaminophen (TYLENOL) 500 MG tablet Take 1,000 mg by mouth every 8 (eight) hours as needed for fever or headache (pain).   ALPRAZolam (XANAX) 0.5 MG tablet Take 1 tablet (0.5 mg total) by mouth at bedtime. May also take 1 tablet every 12 hours as needed anxiety (Patient taking differently: Take 0.5 mg by mouth at bedtime.)   apixaban (ELIQUIS) 5 MG TABS tablet Take 5 mg by mouth 2 (two) times daily.    baclofen (LIORESAL) 20 MG tablet Take 20 mg by mouth daily as needed for muscle spasms.   Biotin 16109 MCG TABS Take 10,000 mcg by mouth daily after lunch.   bisacodyl (DULCOLAX) 10 MG suppository Place 10 mg rectally daily as needed (constipation).   buPROPion (WELLBUTRIN XL) 150 MG 24 hr tablet Take 1 tablet (150 mg total) by mouth daily.   Calcium Carb-Cholecalciferol (CALCIUM 500/D) 500-10 MG-MCG CHEW Chew 1 tablet by mouth 2 (two) times daily.   cetirizine (ZYRTEC) 10 MG tablet Take 10 mg by mouth daily as needed for allergies.  cholecalciferol 25 MCG (1000 UT) tablet Take 2,000 Units by mouth 2 (two) times daily with breakfast and lunch.   clotrimazole (LOTRIMIN) 1 % cream Apply 1 Application topically 2 (two) times daily. Apply to neck folds.   corticotropin (ACTHAR) 80 UNIT/ML injectable gel Inject 1 mL (80 Units total) into the skin every other day for 10 days. May give additional doses PRN. (Patient taking differently: Inject 80 Units into the skin See admin instructions. Inject 80 units as needed (about 5 times a month))   D-Mannose 500 MG CAPS Take 2,000 mg by mouth daily.   diclofenac Sodium (VOLTAREN) 1 % GEL Apply 2 g topically 3 (three) times daily as needed. (Patient taking differently: Apply 2 g topically 3 (three) times daily as needed (pain). To bilateral shoulders)   diphenhydrAMINE (BENADRYL) 25 mg capsule Take 25 mg by mouth 3 (three) times daily as needed (minor itching, irritation).   Emollient (CERAVE DAILY MOISTURIZING) LOTN Apply 1 application  topically daily. To groin, inguinal area   escitalopram (LEXAPRO) 10 MG tablet Take 1 tablet (10 mg total) by mouth daily. (Patient taking differently: Take 10 mg by mouth at bedtime.)   famotidine (PEPCID) 20 MG tablet Take 20 mg by mouth at bedtime.   fluticasone (FLONASE) 50 MCG/ACT nasal spray Place 2 sprays into both nostrils daily.   gabapentin (NEURONTIN) 300 MG capsule Mrs. Weinkauf will take 300 mg of gabapentin at 6 AM, 10  AM, 2 PM and 6 PM and 600 mg at 10 PM by mouth. (Patient taking differently: Take 300 mg by mouth See admin instructions. 300 mg 5 times daily at 0600, 1000, 1400, 1800 and 300-600 mg at bedtime.)   lactulose (CHRONULAC) 10 GM/15ML solution Take 15 mLs (10 g total) by mouth 2 (two) times daily as needed for mild constipation. (Patient taking differently: Take 10 g by mouth 2 (two) times daily.)   levothyroxine (SYNTHROID) 75 MCG tablet Take 75 mcg by mouth daily before breakfast.   melatonin 5 MG TABS Take 5 mg by mouth at bedtime.   metolazone (ZAROXOLYN) 2.5 MG tablet Take 2.5 mg by mouth daily as needed (weight gain > 3 lbs in 2 days with swelling.).   metoprolol succinate (TOPROL-XL) 25 MG 24 hr tablet Take 1 tablet (25 mg total) by mouth daily.   midodrine (PROAMATINE) 2.5 MG tablet Take 2 tablets (5 mg total) by mouth 3 (three) times daily as needed (hypotension (systolic blood pressure less than 100)).   Multiple Vitamins-Minerals (CEROVITE SENIOR) TABS Take 1 tablet by mouth daily.   NONFORMULARY OR COMPOUNDED ITEM Apply 1 Application topically See admin instructions. 1:1 clotrimazole 1% and hydrocortisone 1%. Apply twice daily to perineal region and abdominal folds. (Patient not taking: Reported on 01/30/2023)   omeprazole (PRILOSEC) 40 MG capsule Take 1 capsule (40 mg total) by mouth every morning.   ondansetron (ZOFRAN-ODT) 4 MG disintegrating tablet Take 4 mg by mouth 4 (four) times daily.   oxybutynin (DITROPAN) 5 MG tablet Take 5 mg by mouth 2 (two) times daily.   phenazopyridine (PYRIDIUM) 100 MG tablet Take 100 mg by mouth every 8 (eight) hours as needed (dysuria).   phenylephrine-shark liver oil-mineral oil-petrolatum (PREPARATION H) 0.25-14-74.9 % rectal ointment Place 1 Application rectally every 6 (six) hours as needed (rectum discomfort).   potassium chloride SA (KLOR-CON) 20 MEQ tablet Take 2 tablets (40 mEq total) by mouth daily.   promethazine (PHENERGAN) 12.5 MG tablet Take  12.5 mg by mouth every 8 (eight) hours as  needed for nausea or vomiting.   Propylene Glycol-Glycerin (ARTIFICIAL TEARS) 1-0.3 % SOLN Place 1-2 drops into both eyes See admin instructions. 2 entries on MAR: 1) 1 drop into each eye twice daily 2) 2 drops into each eye once daily as needed for dry eyes   Prucalopride Succinate (MOTEGRITY) 2 MG TABS Take 2 mg by mouth daily.   Psyllium (METAMUCIL PO) Take 1 packet by mouth 2 (two) times daily. Metamucil 2.4g/5.4g powder packets.   senna (SENOKOT) 8.6 MG TABS tablet Take 17.2 mg by mouth 2 (two) times daily.   sodium phosphate (FLEET) 7-19 GM/118ML ENEM Place 1 enema rectally daily as needed (constipation).   torsemide (DEMADEX) 20 MG tablet Take 1.5 tablets (30 mg total) by mouth 2 (two) times daily.   No facility-administered encounter medications on file as of 02/07/2023.    History obtained from review of EMR, discussion with facility staff/paid caregiver and/or patient.      Latest Ref Rng & Units 11/24/2022   12:50 AM 11/23/2022    1:36 AM 11/22/2022    1:04 AM  CBC  WBC 4.0 - 10.5 K/uL 7.1  7.4  9.0   Hemoglobin 12.0 - 15.0 g/dL 16.1  09.6  04.5   Hematocrit 36.0 - 46.0 % 35.4  33.3  32.1   Platelets 150 - 400 K/uL 244  210  180        Latest Ref Rng & Units 11/24/2022   12:50 AM 11/23/2022    1:36 AM 11/22/2022    1:04 AM  CMP  Glucose 70 - 99 mg/dL 409  811  85   BUN 8 - 23 mg/dL 18  12  16    Creatinine 0.44 - 1.00 mg/dL 9.14  7.82  9.56   Sodium 135 - 145 mmol/L 134  133  130   Potassium 3.5 - 5.1 mmol/L 3.7  3.7  3.6   Chloride 98 - 111 mmol/L 89  90  89   CO2 22 - 32 mmol/L 34  34  32   Calcium 8.9 - 10.3 mg/dL 8.5  8.8  8.3   Total Protein 6.5 - 8.1 g/dL 5.9     Total Bilirubin 0.3 - 1.2 mg/dL 0.5     Alkaline Phos 38 - 126 U/L 71     AST 15 - 41 U/L 15     ALT 0 - 44 U/L 14          Latest Ref Rng & Units 11/24/2022   12:50 AM 11/20/2022   11:56 AM 07/13/2022    8:30 PM  Hepatic Function  Total Protein 6.5 - 8.1 g/dL 5.9   5.5  5.5   Albumin 3.5 - 5.0 g/dL 2.7  2.8  3.0   AST 15 - 41 U/L 15  16  20    ALT 0 - 44 U/L 14  8  7    Alk Phosphatase 38 - 126 U/L 71  60  63   Total Bilirubin 0.3 - 1.2 mg/dL 0.5  1.0  1.1      I reviewed available labs, medications, imaging, studies and related documents from the EMR. Records reviewed.  Physical Exam: GENERAL: NAD LUNGS: CTAB, no increased work of breathing, room air CARDIAC:  S1S2, IRIR with LUSB murmur, BLE 2+ edema, wearing TEDs, No cyanosis ABD:  Normo-active BS x 4 quads, soft, non-tender EXTREMITIES: Strength equal in BLE and BUE, weaker in BLE NEURO:  Noted generalized weakness, no cognitive impairment PSYCH:  non-anxious affect, A &  O x 3  Thank you for the opportunity to participate in the care of Toni [name]. Please call our main office at [phone] if we can be of additional assistance.    Toni [name]  [email or web address]  Phone:  [phone]

## 2023-02-09 ENCOUNTER — Non-Acute Institutional Stay: Payer: Medicare Other | Admitting: Family Medicine

## 2023-02-09 VITALS — BP 104/52 | HR 79 | Temp 98.0°F | Resp 18

## 2023-02-09 DIAGNOSIS — Z515 Encounter for palliative care: Secondary | ICD-10-CM

## 2023-02-09 NOTE — Progress Notes (Signed)
   Therapist, nutritional Palliative Care Consult Note Telephone: [phone]  Fax: [phone]   Date of encounter: 02/07/23 9:27 AM PATIENT NAME: Toni [name] [name] [place] [address]   [phone] (home)  DOB: 05-May-1946 MRN: [id number] PRIMARY CARE PROVIDER:    Sherron Monday, MD,  [address] [place] [address] [phone]  REFERRING PROVIDER:   Sherron Monday, MD [address] [place],  [place] [address] [phone]   Pt was getting up to the bathroom, was in hoyer on arrival and was being taken for shower.  Will reschedule visit. Joycelyn Man FNP-C

## 2023-02-09 NOTE — Progress Notes (Signed)
Therapist, nutritional Palliative Care Consult Note Telephone: [phone]  Fax: [phone]   Date of encounter: 02/09/23 11:24 AM PATIENT NAME: Toni [name] [name] [place] [address]   [phone] (home)  DOB: Dec 20, 1945 MRN: [id number] PRIMARY CARE PROVIDER:    Sherron Monday, MD,  [address] [place] [address] [phone]  REFERRING PROVIDER:   Sherron Monday, MD [address] [place],  [place] [address] [phone]  Health Care Agent/Health Care Power of Attorney:    Contact Information     Name Relation Home Work Mobile   Witz,Kristin Daughter [phone]  [phone]   Qian,Ronald Spouse [phone]  [phone]   Mila Merry [phone]  [phone]        I met face to face with patient and paid caregiver Verlon Au in [facility] [employer]. Palliative Care was asked to follow this patient by consultation request of Sherron Monday, MD to address advance care planning and complex medical decision making. This is a follow up visit.  ADVANCE CARE PLANNING/GOALS OF CARE: Health care surrogate gave her permission to discuss.   PRESENT FOR DISCUSSION: Joycelyn Man FNP-C and daughter Jeanette Caprice  GOALS: Full code per patient's wishes BARRIERS: patient is not ready to discuss comfort care   CODE STATUS: Full code   ASSESSMENT AND / RECOMMENDATIONS:  PPS: 40%  Neurogenic urinary bladder Foley cath to gravity drainage, recommend daily cath care, irrigation prn Monitor for s/sx of infection   Pressure injury of skin of buttocks, stage/laterality unspecified Continue current wound care per wound care team. Agree with use of foley to prevent infection/maceration and further breakdown. Encourage position change and use of pressure reduction surfaces for chair and bed. Albumin low at 2.7, recommend addition of prostat 30 ml daily to improve wound healing.  3.   Obesity  hypoventilation syndrome Noted hypercarbia with CO2 of 34 Encourage compliance with CPAP when sleeping.    Follow up Palliative Care Visit:  Palliative Care continuing to follow up by monitoring for changes in appetite, weight, functional and cognitive status for chronic disease progression and management in agreement with patient's stated goals of care. Next visit in 3-4 weeks or prn.  This visit was coded based on medical decision making (MDM).  Chief Complaint  Pt recently readmitted to SNF for rehab following hospitalization for UTI and hypotension.  HISTORY OF PRESENT ILLNESS:  Toni "Toni [name]" is a 77 y.o. year old female with multiple sclerosis. Pt states she had developed some skin breakdown on her buttocks.  Today is her anniversary. Denies pain, SOB, PND, CP. Has had longstanding nausea since Covid but requiring less Zofran.  Has headaches, had high BP until having Covid when her BP has become low and she has tachycardia.  No falls.  Eating well. Has had hyponatremia.  Had incidental finding in the last couple of days when there was an incidental finding of a blister on her right hip and enlarged.  She has no pain, burning or drainage from lesion.  Was started on Valtrex today.     ACTIVITIES OF DAILY LIVING: CONTINENT OF BLADDER/BOWEL? No, has foley BATHING/Dressing-requires assistance, feeding self with weighted utensils  MOBILITY:   WHEELCHAIR/BEDBOUND  APPETITE? 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   Wheelchair dependent 12/25/2022   Pressure injury of skin 11/21/2022   Sepsis secondary to UTI (HCC) 11/20/2022   (  HFpEF) heart failure with preserved ejection fraction (HCC) 07/14/2022   Hyponatremia 07/13/2022   Cushing's syndrome, ACTH dependent (HCC) 09/12/2021   Presence of intrathecal pump 08/18/2021   Spasticity 02/18/2021   Bladder spasms  01/04/2021   UTI (urinary tract infection) 01/03/2021   Bilateral carpal tunnel syndrome 10/04/2020   Multiple sclerosis, 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HCC) 09/13/2020   Chronic intermittent hypoxia with obstructive sleep apnea 09/13/2020   COVID-19 long hauler manifesting chronic dyspnea 09/13/2020   Chronic diastolic CHF (congestive heart failure) (HCC) 09/13/2020   Acute respiratory failure with hypoxia (HCC) 09/13/2020   Class 2 severe obesity with serious comorbidity and body mass index (BMI) of 35.0 to 35.9 in adult (HCC) 09/13/2020   Sepsis (HCC) 06/04/2020   Atrial fibrillation with RVR (HCC) 04/07/2020   Recurrent UTI 04/07/2020   Uncomplicated degenerative myopia of both eyes 03/19/2020   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/2020   Oxygen dependent 02/26/2020   Neurogenic hypoventilation 02/26/2020   Complex atypical endometrial hyperplasia    Acute on chronic respiratory failure with hypoxia (HCC) 10/10/2019   Multiple sclerosis (HCC) 10/10/2019   Bilateral leg weakness 08/04/2019   Weakness 08/04/2019   Unilateral primary osteoarthritis, left knee 01/09/2019   Obstructive sleep apnea treated with bilevel positive airway pressure (BiPAP) 08/15/2018   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) 11/21/2017   Permanent atrial fibrillation (HCC) 11/21/2017   Tightness of leg fascia 11/21/2017   Muscle spasm of both lower legs 11/21/2017   Carpal tunnel syndrome, left upper limb 09/13/2017   Carpal tunnel syndrome, right upper limb 09/13/2017   Other insomnia 06/11/2017   Depression with anxiety 06/11/2017   Vitamin D deficiency 03/22/2017   Right hand weakness 08/06/2016   Nonischemic cardiomyopathy (HCC) 05/09/2016   Localized edema 05/09/2016   Long term current use of anticoagulant therapy 02/03/2016   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   Obesity hypoventilation syndrome (HCC) 03/30/2015   Abnormality of gait 03/01/2015    Chronic constipation with overflow incontinence 01/25/2015   ACTH dependent Cushing's syndrome (HCC) 01/25/2015   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) 01/25/2015   Neurogenic urinary bladder disorder 08/24/2014   Varicose veins of both lower extremities with complications 06/04/2013   Pseudophakia of both eyes 02/07/2012   Multiple sclerosis, 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 11/26/2011   Hypothyroidism    HLD (hyperlipidemia)    PAST MEDICAL HISTORY:  Active Ambulatory Problems    Diagnosis Date Noted   Multiple sclerosis, 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 11/26/2011   Hypothyroidism    HLD (hyperlipidemia)    Varicose veins of both lower extremities with complications 06/04/2013   Neurogenic urinary bladder disorder 08/24/2014   Chronic constipation with overflow incontinence 01/25/2015   ACTH dependent Cushing's syndrome (HCC) 01/25/2015   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) 01/25/2015   Abnormality of gait 03/01/2015   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   Obesity hypoventilation syndrome (HCC) 03/30/2015   Long term current use of anticoagulant therapy 02/03/2016   Nonischemic cardiomyopathy (HCC) 05/09/2016   Localized edema 05/09/2016   Right hand weakness 08/06/2016   Vitamin D deficiency 03/22/2017   Other insomnia 06/11/2017   Depression with anxiety 06/11/2017   Carpal tunnel syndrome, left upper limb 09/13/2017   Carpal tunnel syndrome, right upper limb 09/13/2017   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) 11/21/2017   Permanent atrial fibrillation (HCC) 11/21/2017  Tightness of leg fascia 11/21/2017   Muscle spasm of both lower legs 11/21/2017   Obstructive sleep apnea treated with bilevel positive airway pressure (BiPAP) 08/15/2018   Unilateral primary osteoarthritis, left knee 01/09/2019   Bilateral leg weakness  08/04/2019   Weakness 08/04/2019   Acute on chronic respiratory failure with hypoxia (HCC) 10/10/2019   Multiple sclerosis (HCC) 10/10/2019   Complex atypical endometrial hyperplasia    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/2020   Oxygen dependent 02/26/2020   Neurogenic hypoventilation 02/26/2020   Atrial fibrillation with RVR (HCC) 04/07/2020   Recurrent UTI 04/07/2020   Sepsis (HCC) 06/04/2020   Multiple sclerosis, 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HCC) 09/13/2020   Chronic intermittent hypoxia with obstructive sleep apnea 09/13/2020   COVID-19 long hauler manifesting chronic dyspnea 09/13/2020   Chronic diastolic CHF (congestive heart failure) (HCC) 09/13/2020   Acute respiratory failure with hypoxia (HCC) 09/13/2020   Class 2 severe obesity with serious comorbidity and body mass index (BMI) of 35.0 to 35.9 in adult [facility]) 09/13/2020   Bilateral carpal tunnel syndrome 10/04/2020   Pseudophakia of both eyes 02/07/2012   Uncomplicated degenerative myopia of both eyes 03/19/2020   UTI (urinary tract infection) 01/03/2021   Bladder spasms 01/04/2021   Spasticity 02/18/2021   Presence of intrathecal pump 08/18/2021   Cushing's syndrome, ACTH dependent (HCC) 09/12/2021   Hyponatremia 07/13/2022   (HFpEF) heart failure with preserved ejection fraction (HCC) 07/14/2022   Sepsis secondary to UTI (HCC) 11/20/2022   Pressure injury of skin 11/21/2022   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/25/2022   Wheelchair dependent 12/25/2022   Resolved Ambulatory Problems    Diagnosis Date Noted   Aspiration pneumonia vs Failed outpatient Rx of CAP 11/26/2011   Essential hypertension    Atrial fibrillation (HCC)    Long term (current) use of anticoagulants 11/05/2012   Paroxysmal A-fib (HCC) 06/04/2013   Morbid obesity (HCC) 06/04/2013   OSA on CPAP 06/04/2013   Closed right ankle fracture 10/30/2013   Ankle fracture 10/30/2013   MS (multiple sclerosis) (HCC) 07/29/2014   Acute cystitis with hematuria 08/24/2014    Medication monitoring encounter 01/25/2015   Medication course changed 06/02/2015   Dysenteric diarrhea 10/04/2015   Chest pain 01/19/2016   Chronic atrial fibrillation (HCC) 01/19/2016   Combined systolic and diastolic congestive heart failure (HCC) 01/19/2016   Atrial fibrillation with RVR (HCC) 01/19/2016   Persistent atrial fibrillation with RVR (HCC) 01/19/2016   Chest pain at rest 02/03/2016   Atrial fibrillation with rapid ventricular response (HCC) 08/06/2016   Pressure injury of skin 08/06/2016   Dyspnea    Fever    Weakness 12/24/2016   Multiple sclerosis (HCC) 12/25/2016   Class 3 obesity with serious comorbidity and body mass index (BMI) of 40.0 to 44.9 in adult 03/22/2017   Prediabetes 03/22/2017   Central apnea 03/26/2017   Acute on chronic diastolic heart failure (HCC) 03/29/2017   Abdominal swelling 03/29/2017   Sleep apnea treated with nocturnal BiPAP 04/23/2018   Type 2 diabetes mellitus without complication, without long-term current use of insulin (HCC) 08/27/2018   Acute lower UTI 08/04/2019   Constipation    Acute hypotension 08/22/2019   COVID-19 virus infection 08/23/2019   HCAP (healthcare-associated pneumonia) 10/09/2019   SOB (shortness of breath) 10/10/2019   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HCC) 12/01/2019   Acute metabolic encephalopathy 06/04/2020   Cushing's syndrome, ACTH dependent (HCC) 09/13/2020   Past Medical History:  Diagnosis Date   Anxiety    Arthritis    Asthma  Back pain    Cancer (HCC)    CHF (congestive heart failure) (HCC)    Chronic pain    Depression    DVT (deep venous thrombosis) (HCC) 1983   Dysrhythmia    Edema    GERD (gastroesophageal reflux disease)    Hypertension    Hypotension    Joint pain    Osteoarthritis    PAF (paroxysmal atrial fibrillation) (HCC)    Pneumonia 11/2011   Sleep apnea    SOCIAL HX:  Social History   Tobacco Use   Smoking status: Former    Packs/day: 0.50    Years: 7.00     Additional pack years: 0.00    Total pack years: 3.50    Types: Cigarettes    Quit date: 08/21/1976    Years since quitting: 46.5   Smokeless tobacco: Never  Substance Use Topics   Alcohol use: Not Currently   FAMILY HX:  Family History  Problem Relation Age of Onset   Depression Mother    Sudden death Mother    Anxiety disorder Mother    Coronary artery disease Father        at age 2   Hyperlipidemia Father    Thyroid disease Father    Coronary artery disease Maternal Grandmother    Depression Maternal Grandmother    Cancer Paternal Grandmother        breast   Alcoholism Son    Sleep apnea Neg Hx    Multiple sclerosis Neg Hx        Preferred Pharmacy: [facility]:  Allergies  Allergen Reactions   Other Hives and Rash    Topical Surgical prep  - severe rash, hives   Hydrocodone Nausea And Vomiting   Oxycontin [Oxycodone] Nausea And Vomiting   Zoloft [Sertraline] Hives, Swelling and Rash   Chloraseptic [Phenol] Hives    Not documented on MAR   Macrobid [Nitrofurantoin] Nausea And Vomiting    Not documented on MAR   Vioxx [Rofecoxib] Other (See Comments)    Unknown reaction Not documented on MAR   Hibiclens [Chlorhexidine Gluconate] Hives, Rash and Other (See Comments)    Sores     PERTINENT MEDICATIONS:  Outpatient Encounter Medications as of 02/09/2023  Medication Sig   acetaminophen (TYLENOL) 500 MG tablet Take 1,000 mg by mouth every 8 (eight) hours as needed for fever or headache (pain).   ALPRAZolam (XANAX) 0.5 MG tablet Take 1 tablet (0.5 mg total) by mouth at bedtime. May also take 1 tablet every 12 hours as needed anxiety (Patient taking differently: Take 0.5 mg by mouth at bedtime.)   apixaban (ELIQUIS) 5 MG TABS tablet Take 5 mg by mouth 2 (two) times daily.   baclofen (LIORESAL) 20 MG tablet Take 20 mg by mouth daily as needed for muscle spasms.   Biotin 64403 MCG TABS Take 10,000 mcg by mouth daily after lunch.   bisacodyl (DULCOLAX) 10 MG suppository  Place 10 mg rectally daily as needed (constipation).   buPROPion (WELLBUTRIN XL) 150 MG 24 hr tablet Take 1 tablet (150 mg total) by mouth daily.   Calcium Carb-Cholecalciferol (CALCIUM 500/D) 500-10 MG-MCG CHEW Chew 1 tablet by mouth 2 (two) times daily.   cetirizine (ZYRTEC) 10 MG tablet Take 10 mg by mouth daily as needed for allergies.   cholecalciferol 25 MCG (1000 UT) tablet Take 2,000 Units by mouth 2 (two) times daily with breakfast and lunch.   clotrimazole (LOTRIMIN) 1 % cream Apply 1 Application topically 2 (two) times daily.  Apply to neck folds.   corticotropin (ACTHAR) 80 UNIT/ML injectable gel Inject 1 mL (80 Units total) into the skin every other day for 10 days. May give additional doses PRN. (Patient taking differently: Inject 80 Units into the skin See admin instructions. Inject 80 units as needed (about 5 times a month))   D-Mannose 500 MG CAPS Take 2,000 mg by mouth daily.   diclofenac Sodium (VOLTAREN) 1 % GEL Apply 2 g topically 3 (three) times daily as needed. (Patient taking differently: Apply 2 g topically 3 (three) times daily as needed (pain). To bilateral shoulders)   diphenhydrAMINE (BENADRYL) 25 mg capsule Take 25 mg by mouth 3 (three) times daily as needed (minor itching, irritation).   Emollient (CERAVE DAILY MOISTURIZING) LOTN Apply 1 application  topically daily. To groin, inguinal area   escitalopram (LEXAPRO) 10 MG tablet Take 1 tablet (10 mg total) by mouth daily. (Patient taking differently: Take 10 mg by mouth at bedtime.)   famotidine (PEPCID) 20 MG tablet Take 20 mg by mouth at bedtime.   fluticasone (FLONASE) 50 MCG/ACT nasal spray Place 2 sprays into both nostrils daily.   gabapentin (NEURONTIN) 300 MG capsule Mrs. Hammitt will take 300 mg of gabapentin at 6 AM, 10 AM, 2 PM and 6 PM and 600 mg at 10 PM by mouth. (Patient taking differently: Take 300 mg by mouth See admin instructions. 300 mg 5 times daily at 0600, 1000, 1400, 1800 and 300-600 mg at bedtime.)    lactulose (CHRONULAC) 10 GM/15ML solution Take 15 mLs (10 g total) by mouth 2 (two) times daily as needed for mild constipation. (Patient taking differently: Take 10 g by mouth 2 (two) times daily.)   levothyroxine (SYNTHROID) 75 MCG tablet Take 75 mcg by mouth daily before breakfast.   melatonin 5 MG TABS Take 5 mg by mouth at bedtime.   metolazone (ZAROXOLYN) 2.5 MG tablet Take 2.5 mg by mouth daily as needed (weight gain > 3 lbs in 2 days with swelling.).   metoprolol succinate (TOPROL-XL) 25 MG 24 hr tablet Take 1 tablet (25 mg total) by mouth daily.   midodrine (PROAMATINE) 2.5 MG tablet Take 2 tablets (5 mg total) by mouth 3 (three) times daily as needed (hypotension (systolic blood pressure less than 100)).   Multiple Vitamins-Minerals (CEROVITE SENIOR) TABS Take 1 tablet by mouth daily.   NONFORMULARY OR COMPOUNDED ITEM Apply 1 Application topically See admin instructions. 1:1 clotrimazole 1% and hydrocortisone 1%. Apply twice daily to perineal region and abdominal folds. (Patient not taking: Reported on 01/30/2023)   omeprazole (PRILOSEC) 40 MG capsule Take 1 capsule (40 mg total) by mouth every morning.   ondansetron (ZOFRAN-ODT) 4 MG disintegrating tablet Take 4 mg by mouth 4 (four) times daily.   oxybutynin (DITROPAN) 5 MG tablet Take 5 mg by mouth 2 (two) times daily.   phenazopyridine (PYRIDIUM) 100 MG tablet Take 100 mg by mouth every 8 (eight) hours as needed (dysuria).   phenylephrine-shark liver oil-mineral oil-petrolatum (PREPARATION H) 0.25-14-74.9 % rectal ointment Place 1 Application rectally every 6 (six) hours as needed (rectum discomfort).   potassium chloride SA (KLOR-CON) 20 MEQ tablet Take 2 tablets (40 mEq total) by mouth daily.   promethazine (PHENERGAN) 12.5 MG tablet Take 12.5 mg by mouth every 8 (eight) hours as needed for nausea or vomiting.   Propylene Glycol-Glycerin (ARTIFICIAL TEARS) 1-0.3 % SOLN Place 1-2 drops into both eyes See admin instructions. 2 entries on  MAR: 1) 1 drop into each eye twice  daily 2) 2 drops into each eye once daily as needed for dry eyes   Prucalopride Succinate (MOTEGRITY) 2 MG TABS Take 2 mg by mouth daily.   Psyllium (METAMUCIL PO) Take 1 packet by mouth 2 (two) times daily. Metamucil 2.4g/5.4g powder packets.   senna (SENOKOT) 8.6 MG TABS tablet Take 17.2 mg by mouth 2 (two) times daily.   sodium phosphate (FLEET) 7-19 GM/118ML ENEM Place 1 enema rectally daily as needed (constipation).   torsemide (DEMADEX) 20 MG tablet Take 1.5 tablets (30 mg total) by mouth 2 (two) times daily.   No facility-administered encounter medications on file as of 02/09/2023.    History obtained from review of EMR, discussion with facility staff/paid caregiver and/or patient.          I reviewed available labs, medications, imaging, studies and related documents from the EMR. Records reviewed.  Physical Exam: GENERAL: NAD LUNGS: CTAB, no increased work of breathing, room air CARDIAC:  S1S2, IRIR with LUSB murmur, BLE 1+ edema, wearing TEDs, No cyanosis ABD:  Normo-active BS x 4 quads, soft, non-tender GU EXTREMITIES: Strength equal in BLE and BUE, weaker in BLE NEURO:  Noted generalized weakness, no cognitive impairment PSYCH:  non-anxious affect, A & O x 3  Thank you for the opportunity to participate in the care of Toni [name]. Please call our main office at [phone] if we can be of additional assistance.    Joycelyn Man FNP-C  [email or web address] .[facility]  Phone:  [phone]

## 2023-02-11 NOTE — Progress Notes (Signed)
Attempted visit but pt was unavailable for personal care.  Joycelyn Man FNP-C

## 2023-02-21 ENCOUNTER — Ambulatory Visit (INDEPENDENT_AMBULATORY_CARE_PROVIDER_SITE_OTHER): Payer: Medicare Other | Admitting: Neurology

## 2023-02-21 ENCOUNTER — Encounter: Payer: Self-pay | Admitting: Neurology

## 2023-02-21 VITALS — BP 112/77 | HR 80 | Ht 63.0 in

## 2023-02-21 DIAGNOSIS — R0689 Other abnormalities of breathing: Secondary | ICD-10-CM | POA: Diagnosis not present

## 2023-02-21 DIAGNOSIS — Z9989 Dependence on other enabling machines and devices: Secondary | ICD-10-CM

## 2023-02-21 DIAGNOSIS — Z9981 Dependence on supplemental oxygen: Secondary | ICD-10-CM

## 2023-02-21 DIAGNOSIS — G4731 Primary central sleep apnea: Secondary | ICD-10-CM

## 2023-02-21 DIAGNOSIS — G35 Multiple sclerosis: Secondary | ICD-10-CM

## 2023-02-21 DIAGNOSIS — E240 Pituitary-dependent Cushing's disease: Secondary | ICD-10-CM

## 2023-02-21 DIAGNOSIS — I4891 Unspecified atrial fibrillation: Secondary | ICD-10-CM | POA: Diagnosis not present

## 2023-02-21 MED ORDER — ACETAMINOPHEN-CODEINE 300-30 MG PO TABS: 1.0000 | ORAL_TABLET | ORAL | 1 refills | Status: AC | PRN

## 2023-02-21 MED ORDER — ACTHAR 80 UNIT/ML IJ GEL: 80.0000 [IU] | INTRAMUSCULAR | 5 refills | Status: DC

## 2023-02-21 MED ORDER — CORTICOTROPIN 80 UNIT/ML IJ GEL: INTRAMUSCULAR | 5 refills | Status: DC

## 2023-02-21 NOTE — Addendum Note (Signed)
Addended by: Melvyn Novas on: 02/21/2023 04:02 PM   Modules accepted: Orders

## 2023-02-21 NOTE — Patient Instructions (Signed)
Rv in 6-7 months.

## 2023-02-21 NOTE — Progress Notes (Signed)
Provider:  Melvyn Novas, MD  Primary Care Physician:  Sherron Monday, MD [address] [place] [address]     Referring Provider: Sherron Monday, Md [address] [place],  [place] [address]          Chief Complaint according to patient   Patient presents with:     New Patient (Initial Visit)           HISTORY OF PRESENT ILLNESS:  Toni [name] is a 77 y.o. female patient who is here for revisit 02/21/2023 for  long -term MS and  ASV-PAP dependency . Cushing's. POTS. ACTH dependent.   02-20-2013.  Chief concern according to patient :  "I just came out of isolation for shingles, but have no pain , no itch and only 2 little blisters. I have a new , different type of headache. I used to have migraines until I started metoprolol.  I am still on it but I have low BP now. I have hypotension since Covid. "   This patient has been diagnosed with central sleep apnea associated with atrial fibrillation and diastolic heart failure.  She was until September 2019 treated with BiPAP and then switched to ASV.  We are meeting today after 18 months hiatus.  She reports that she has not been receiving Gilmore Laroche or ACTH for a while and there seems to be a preauthorization issue.  In addition she wanted to discuss her ASV settings and the mask fitting. Sh is using a FFM with magnet closures, mask is small / headgear is small . Resmed F 20 . It seems large to me/   5 liters of 02 are bled into CPAP ever since COVID.  The patient is 100% compliant CPAP user and at the facility where she resides she has help with putting the machine on and there assistance also notes that she gets at least 8 hours of sleep on her ASV machine and they comment on the smiley faces in the morning.  She uses the machine on average 9 hours and 40 minutes which may not all reflect sleep.  Her ASV machine has a expiratory pressure of 12 cmH2O a minimum pressure support of 3 and maximum pressure support of 13  cmH2O this means that the patient has a de facto 16 cm variability in pressure.  As to her residual AHI it is wonderful 4.8/h and there is a minimal air leak the 95th percentile air leak is 5 L/min.  So even that the mask looks a little large to me it seems to work fine.  I am very happy with the results.  On BiPAP she had much higher residuals.  Our list still states that the patient is on her baclofen pump that she has Gilmore Laroche available at 80 units/mL every other day for 10 days a months.  And that she is not getting it was news to me.  So I would definitely sign a new prescription today.  In addition she has as needed for anxiety and low-dose of Xanax 0.5 mg.  And there is Wellbutrin given in the morning 150 mg.  And Lexapro has been given at night she states Toprol is 25 mg a day, Neurontin 300 mg at 6 AM and 10 AM 2 PM and 6 PM helping with neuropathic pain.  Synthroid 75 mcg taken before breakfast.   She is on midodrine for  POTs / Cushings dysautonomia  prn 2 tablets 5 mg total by mouth 3 times a day to support r when in hypotension.   Dr Epimenio Foot will fill the baclofen pump 22 of July. Dr Rennis Golden  is her cardiologist.     09/12/21: Toni [name] is a long term MS patient , now in the non active , secondary progressive stages of the disease, seen on 09-12-2021, revisit with her daughter present.  Toni [name] lives now at [facility],  her speech is  slurred, she is slower in speech and comprehension, she is wheelchair  bound.  Her BP runs low and she has been  in permanent atrial fib, long haul from Covid. She has dry eyes, special problem at night. Biotin gel at night, ASV user.  She has been hospitalized many times for UTI, she can't take cranberries- Vit K.  Has been seen by gyneco-urologist. No solution for her problem was found.  She has been started on oxygen and on ASV- had a new baclofen pump-  flushed tubing after being implanted , July 1.2022. Dr Anne Hahn had reduced the baclofen dose and we  will try to reduce further. Emma RN follows up.       Toni [name] is a 77 year old female with a history of obstructive sleep apnea on BiPAP and multiple sclerosis. 03-07-2021.    She was seen on 03-07-2021. She has been hospitalized many times for UTI, has been started on oxygen and on ASV- had a new baclofen pump- implanted on July 1.2022. Dr Anne Hahn has reduced the baclofen dose and will try to reduce further.  Had a flushing done in hospital- cathether was open. The patient started on out to servo ventilation her compliance is excellent 100% of the last 30 days 29 of those days was over 4 hours with an average of 9 hours 46 minutes.  Residual AHI was 6.5 of which only 1/h is an apnea and 5.5 hypopneas or shallow breathing spells.  There was a minimum leak and median was 3.7 L/min so based on this and knowing that she has additional oxygen I think it will make a difference in her restorative quality of her sleep.  She is still daytime sleepy which is partially due to all other medical conditions and also due to medication especially the higher doses of Neurontin and baclofen I think that reducing her baclofen pump a little further may help to restore some alertness and I am happy to look if we can change her gabapentin doses from lowered doses through the day to a higher dose at night.  The Epworth sleepiness score was still endorsed at 18 out of 24 points today.  Medication is widely unchanged except for the change in the new baclofen pump settings.  The patient currently takes gabapentin 3 mg at 1 capsule  up to 4 throughout the day. Every 6 hours, but medication wears off at 4 hours- will increase frequency and not dose per time.          She returns today for follow-up on 09-13-2020. We are discussing her high AHi on BiPAP, also she is very compliant with aditional 3 liters of oxygen, she will have to re qualify for oxygen , I ned her to come in for in lab study , but she is n need of a medical bed. All  this has beeen hampering her progress.  Since she contracted COVID in a rehab facility she has lost weight, has SOB more often, and she is not doing well with  the PAP mask. She feels she needs more ACTHAR shots.  These halp her still.  She has had a baclofen pump , but there was enough baclofen in the pump, and she remains in pain.  She has fluid retention- Dr Rennis Golden, CHF related anasarca. belly swelling and spasms in abdomen.  She is on diuretics and potassium.  She has no bladder retention, and her CUREwick is helpful.  OT wants a new NCV. She had thenar atphroy - left hand. Dr Anne Hahn stated this is Carpal tunnel- her hand surgeon wants documentation.      Multiple sclerosis: She continues on Acthar. She continues to have weakness in the right lower extremity.  She states that she has a hard time pivoting when ambulating.  She is in a wheelchair today.  She does feel that her memory has changed over time.  She states that if she does not make a list she typically does not remember.  She has a caregiver as well as her husband that helps her.   Obstructive sleep apnea on BiPAP: Download indicates that she use her machine nightly for compliance of 100%.  She use her machine greater than 4 hours each night.  On average she uses it 8 hours and 50 minutes.  Her residual AHI is 21.5 on 16/11 centimeters of water.  She reports that she had an overnight pulse oximetry test with her pulmonologist that showed desaturations despite being on 3 L of oxygen at night.  She feels that her BiPAP pressure is too low.  She returns today for an evaluation.   [name] who prefers to be called anti-Jo, is a 77 year old Caucasian female with a longstanding history of multiple sclerosis.  Her primary care physician is Dr. Chilton Greathouse, MD PhD, her cardiologist is Dr. Rennis Golden and her new pulmonologist is Dr. Wynona Neat.  [name], she has a  baclofen pump to deal with the spasticity remaining she had further progression after she contracted Covid, she still feels that she has not recovered her baseline which is very likely.  The patient has atrial fibrillation for which she is followed by Dr. Rennis Golden, steroid infusions that be used in the past to limit an MS exacerbation had also contrast contributed to atrophic in onset.  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  BiPAP was no longer working and especially did not prevent hypoxemia at night.  She is using BiPAP present but it has produced an enormous oral airleak and she cannot tolerate a full facemask, he finally were able to convince her pulmonologist that she needs oxygen at 3 L either bled into this BiPAP device or to be used by nasal cannula .     Review of Systems: Out of a complete 14 system review, the patient complains of only the following symptoms, and all other reviewed systems are negative.:  Total = 19/ 24 points   FSS endorsed at 21/ 63 points.   Recent UTi, Headaches.  recent  shingles, post covid oxygen dependency and ACTH induced cushings.     Social History   Socioeconomic History   Marital status: Married    Spouse name: [name]   Number of children: 2   Years of education: COLLEGE   Highest education level: Not on file  Occupational History   Occupation: RETIRED  Tobacco Use   Smoking status: Former    Packs/day: 0.50  Years: 7.00    Additional pack years: 0.00    Total pack years: 3.50    Types: Cigarettes    Quit date: 08/21/1976    Years since quitting: 46.5   Smokeless tobacco: Never  Vaping Use   Vaping Use: Never used  Substance and Sexual Activity   Alcohol use: Not Currently   Drug use: No   Sexual activity: Not Currently    Birth control/protection: Post-menopausal  Other Topics Concern   Not on file  Social History Narrative   Lives in [place] with Husband Windy Fast [phone], [phone]   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   Right handed   Drinks a little caffeine in coffee sometimes    Social Determinants of Health   Financial Resource Strain: Not on file  Food Insecurity: No Food Insecurity (07/14/2022)   Hunger Vital Sign    Worried About Running Out of Food in the Last Year: Never true    Ran Out of Food in the Last Year: Never true  Transportation Needs: No Transportation Needs (07/15/2022)   PRAPARE - Administrator, Civil Service (Medical): No    Lack of Transportation (Non-Medical): No  Physical Activity: Not on file  Stress: Not on file  Social Connections: Not on file    Family History  Problem Relation Age of Onset   Depression Mother    Sudden death Mother    Anxiety disorder Mother    Coronary artery disease Father        at age 6   Hyperlipidemia Father    Thyroid disease Father    Coronary artery disease Maternal Grandmother    Depression Maternal Grandmother    Cancer Paternal Grandmother        breast   Alcoholism Son    Sleep apnea Neg Hx    Multiple sclerosis Neg Hx     Past Medical History:  Diagnosis Date   Abnormality of gait 03/01/2015   Acute metabolic encephalopathy 06/04/2020   Anxiety    Arthritis    "knees" (01/19/2016)   Asthma    as a child    Atrial fibrillation (HCC)    Back pain    Bilateral carpal tunnel syndrome 10/04/2020   Cancer (HCC)    / RENAL CELL CARCINOMA - GETS CT SCAN EVERY 6 MONTHS TO WATCH    CHF (congestive heart failure) (HCC)    Chronic pain    "nerve pain from the MS"   Complex atypical endometrial hyperplasia    Constipation    COVID-19 virus infection 08/23/2019   Depression    DVT (deep venous thrombosis) (HCC) 1983   "RLE; may have just been phlebitis; it was before the age of dopplers"   Dysrhythmia    afib    Edema    varicose veins with severe venous insuff in R and L GSV' ablation of R GSV 2012   GERD (gastroesophageal reflux disease)    HLD (hyperlipidemia)    Hypertension     Hypotension    Hypothyroidism    Joint pain    Multiple sclerosis (HCC)    has had this may 1989-[facility] reg doc   OSA on CPAP    bipap machine - SLEEP APNEA WORSE SINCE COVID IN 1/21 PER PATIENT SETTING HAS INCREASED FROM 9 TO 30    Osteoarthritis    PAF (paroxysmal atrial fibrillation) (HCC)    on coumadin; documented on monitor 06/2010 - no longer takes per Renne Crigler 02/09/21   Pneumonia  11/2011   Sleep apnea    uses sleep apnea machine    Past Surgical History:  Procedure Laterality Date   ANTERIOR CERVICAL DECOMP/DISCECTOMY FUSION  01/2004   CARDIAC CATHETERIZATION  06/22/2010   normal coronary arteries, PAF   CATARACT EXTRACTION, BILATERAL Bilateral    CHILD BIRTHS     X2   DILATATION & CURETTAGE/HYSTEROSCOPY WITH MYOSURE N/A 01/15/2020   Procedure: DILATATION & CURETTAGE/HYSTEROSCOPY WITH MYOSURE;  Surgeon: Richardean Chimera, MD;  Location: [facility];  Service: Gynecology;  Laterality: N/A;  pt in wheelchair-has MS   DILATION AND CURETTAGE OF UTERUS     DILATION AND CURETTAGE OF UTERUS N/A 02/16/2020   Procedure: DILATATION AND CURETTAGE;  Surgeon: Carver Fila, MD;  Location: [facility];  Service: Gynecology;  Laterality: N/A;  NOT GENERAL -NO INTUBATION   DNC     INFUSION PUMP IMPLANTATION  ~ 2009   baclofen infusion in lower abd   INTRATHECAL PUMP REVISION N/A 02/18/2021   Procedure: Baclofen Pump replacement with possible revision of catheter;  Surgeon: Maeola Harman, MD;  Location: [facility];  Service: Neurosurgery;  Laterality: N/A;  3C/RM 20   INTRAUTERINE DEVICE (IUD) INSERTION N/A 02/16/2020   Procedure: INTRAUTERINE DEVICE (IUD) INSERTION - MIRENA;  Surgeon: Carver Fila, MD;  Location: [facility];  Service: Gynecology;  Laterality: N/A;   IUD put in uterus     PAIN PUMP REVISION N/A 07/29/2014   Procedure: Baclofen pump replacement;  Surgeon: Maeola Harman, MD;  Location: [facility];  Service: Neurosurgery;  Laterality: N/A;  Baclofen pump replacement    TONSILLECTOMY AND ADENOIDECTOMY     VARICOSE VEIN SURGERY  ~ 1968   VENOUS ABLATION  12/16/2010   radiofreq ablation -Dr Lynnea Ferrier and [facility]   WISDOM TOOTH EXTRACTION       Current Outpatient Medications on File Prior to Visit  Medication Sig Dispense Refill   acetaminophen (TYLENOL) 500 MG tablet Take 1,000 mg by mouth every 8 (eight) hours as needed for fever or headache (pain).     ALPRAZolam (XANAX) 0.5 MG tablet Take 1 tablet (0.5 mg total) by mouth at bedtime. May also take 1 tablet every 12 hours as needed anxiety (Patient taking differently: Take 0.5 mg by mouth at bedtime.) 10 tablet 0   apixaban (ELIQUIS) 5 MG TABS tablet Take 5 mg by mouth 2 (two) times daily.     baclofen (LIORESAL) 20 MG tablet Take 20 mg by mouth daily as needed for muscle spasms.     Biotin 29562 MCG TABS Take 10,000 mcg by mouth daily after lunch.     bisacodyl (DULCOLAX) 10 MG suppository Place 10 mg rectally daily as needed (constipation).     buPROPion (WELLBUTRIN XL) 150 MG 24 hr tablet Take 1 tablet (150 mg total) by mouth daily. 90 tablet 1   Calcium Carb-Cholecalciferol (CALCIUM 500/D) 500-10 MG-MCG CHEW Chew 1 tablet by mouth 2 (two) times daily.     cetirizine (ZYRTEC) 10 MG tablet Take 10 mg by mouth daily as needed for allergies.     cholecalciferol 25 MCG (1000 UT) tablet Take 2,000 Units by mouth 2 (two) times daily with breakfast and lunch.     corticotropin (ACTHAR) 80 UNIT/ML injectable gel Inject 1 mL (80 Units total) into the skin every other day for 10 days. May give additional doses PRN. (Patient taking differently: Inject 80 Units into the skin See admin instructions. Inject 80 units as needed (about 5 times a month)) 8 mL 3  D-Mannose 500 MG CAPS Take 2,000 mg by mouth daily.     diclofenac Sodium (VOLTAREN) 1 % GEL Apply 2 g topically 3 (three) times daily as needed. (Patient taking differently: Apply 2 g topically 3 (three) times daily as needed (pain). To bilateral shoulders) 100 g 3    diphenhydrAMINE (BENADRYL) 25 mg capsule Take 25 mg by mouth 3 (three) times daily as needed (minor itching, irritation).     Emollient (CERAVE DAILY MOISTURIZING) LOTN Apply 1 application  topically daily. To groin, inguinal area     escitalopram (LEXAPRO) 10 MG tablet Take 1 tablet (10 mg total) by mouth daily. (Patient taking differently: Take 10 mg by mouth at bedtime.) 90 tablet 1   famotidine (PEPCID) 20 MG tablet Take 20 mg by mouth at bedtime.     fluticasone (FLONASE) 50 MCG/ACT nasal spray Place 2 sprays into both nostrils daily.     gabapentin (NEURONTIN) 300 MG capsule Toni. Aron will take 300 mg of gabapentin at 6 AM, 10 AM, 2 PM and 6 PM and 600 mg at 10 PM by mouth. (Patient taking differently: Take 300 mg by mouth See admin instructions. 300 mg 5 times daily at 0600, 1000, 1400, 1800 and 300-600 mg at bedtime.) 180 capsule 11   lactulose (CHRONULAC) 10 GM/15ML solution Take 15 mLs (10 g total) by mouth 2 (two) times daily as needed for mild constipation. (Patient taking differently: Take 10 g by mouth 2 (two) times daily.) 236 mL 0   levothyroxine (SYNTHROID) 75 MCG tablet Take 75 mcg by mouth daily before breakfast.     melatonin 5 MG TABS Take 5 mg by mouth at bedtime.     metolazone (ZAROXOLYN) 2.5 MG tablet Take 2.5 mg by mouth daily as needed (weight gain > 3 lbs in 2 days with swelling.).     midodrine (PROAMATINE) 2.5 MG tablet Take 2 tablets (5 mg total) by mouth 3 (three) times daily as needed (hypotension (systolic blood pressure less than 100)).     Multiple Vitamins-Minerals (CEROVITE SENIOR) TABS Take 1 tablet by mouth daily.     NONFORMULARY OR COMPOUNDED ITEM Apply 1 Application topically See admin instructions. 1:1 clotrimazole 1% and hydrocortisone 1%. Apply twice daily to perineal region and abdominal folds.     ondansetron (ZOFRAN-ODT) 4 MG disintegrating tablet Take 4 mg by mouth 4 (four) times daily.     oxybutynin (DITROPAN) 5 MG tablet Take 5 mg by mouth 2 (two)  times daily.     phenazopyridine (PYRIDIUM) 100 MG tablet Take 100 mg by mouth every 8 (eight) hours as needed (dysuria).     phenylephrine-shark liver oil-mineral oil-petrolatum (PREPARATION H) 0.25-14-74.9 % rectal ointment Place 1 Application rectally every 6 (six) hours as needed (rectum discomfort).     potassium chloride SA (KLOR-CON) 20 MEQ tablet Take 2 tablets (40 mEq total) by mouth daily. (Patient taking differently: Take 40 mEq by mouth 2 (two) times daily.) 180 tablet 3   promethazine (PHENERGAN) 12.5 MG tablet Take 12.5 mg by mouth every 8 (eight) hours as needed for nausea or vomiting.     Propylene Glycol-Glycerin (ARTIFICIAL TEARS) 1-0.3 % SOLN Place 1-2 drops into both eyes See admin instructions. 2 entries on MAR: 1) 1 drop into each eye twice daily 2) 2 drops into each eye once daily as needed for dry eyes     Prucalopride Succinate (MOTEGRITY) 2 MG TABS Take 2 mg by mouth daily.     Psyllium (METAMUCIL PO) Take 1  packet by mouth 2 (two) times daily. Metamucil 2.4g/5.4g powder packets.     senna (SENOKOT) 8.6 MG TABS tablet Take 17.2 mg by mouth 2 (two) times daily.     sodium phosphate (FLEET) 7-19 GM/118ML ENEM Place 1 enema rectally daily as needed (constipation).     torsemide (DEMADEX) 20 MG tablet Take 1.5 tablets (30 mg total) by mouth 2 (two) times daily. 270 tablet 3   metoprolol succinate (TOPROL-XL) 25 MG 24 hr tablet Take 1 tablet (25 mg total) by mouth daily. 30 tablet 0   No current facility-administered medications on file prior to visit.    Allergies  Allergen Reactions   Other Hives and Rash    Topical Surgical prep  - severe rash, hives   Hydrocodone Nausea And Vomiting   Oxycontin [Oxycodone] Nausea And Vomiting   Zoloft [Sertraline] Hives, Swelling and Rash   Chloraseptic [Phenol] Hives    Not documented on MAR   Macrobid [Nitrofurantoin] Nausea And Vomiting    Not documented on MAR   Vioxx [Rofecoxib] Other (See Comments)    Unknown reaction Not  documented on MAR   Hibiclens [Chlorhexidine Gluconate] Hives, Rash and Other (See Comments)    Sores     DIAGNOSTIC DATA (LABS, IMAGING, TESTING) - I reviewed patient records, labs, notes, testing and imaging myself where available.  Lab Results  Component Value Date   WBC 7.1 11/24/2022   HGB 12.0 11/24/2022   HCT 35.4 (L) 11/24/2022   MCV 89.8 11/24/2022   PLT 244 11/24/2022      Component Value Date/Time   NA 134 (L) 11/24/2022 0050   NA 144 12/15/2020 1100   K 3.7 11/24/2022 0050   CL 89 (L) 11/24/2022 0050   CO2 34 (H) 11/24/2022 0050   GLUCOSE 108 (H) 11/24/2022 0050   BUN 18 11/24/2022 0050   BUN 27 12/15/2020 1100   CREATININE 0.79 11/24/2022 0050   CALCIUM 8.5 (L) 11/24/2022 0050   PROT 5.9 (L) 11/24/2022 0050   PROT 5.9 (L) 09/20/2020 1436   ALBUMIN 2.7 (L) 11/24/2022 0050   ALBUMIN 3.9 09/20/2020 1436   AST 15 11/24/2022 0050   ALT 14 11/24/2022 0050   ALKPHOS 71 11/24/2022 0050   BILITOT 0.5 11/24/2022 0050   BILITOT 0.3 09/20/2020 1436   GFRNONAA >60 11/24/2022 0050   GFRAA 90 09/20/2020 1436   Lab Results  Component Value Date   CHOL 111 03/19/2019   HDL 57 03/19/2019   LDLCALC 43 03/19/2019   TRIG 53 03/19/2019   CHOLHDL 3.0 04/22/2010   Lab Results  Component Value Date   HGBA1C 4.9 06/05/2020   No results found for: "VITAMINB12" Lab Results  Component Value Date   TSH 1.111 11/21/2022    PHYSICAL EXAM:  Today's Vitals   02/21/23 1442  BP: 112/77  Pulse: 80  Height: 5\' 3"  (1.6 m)   Body mass index is 35.18 kg/m.   Wt Readings from Last 3 Encounters:  01/30/23 198 lb 9.6 oz (90.1 kg)  11/22/22 204 lb 2.3 oz (92.6 kg)  09/14/22 202 lb 12.8 oz (92 kg)     Ht Readings from Last 3 Encounters:  02/21/23 5\' 3"  (1.6 m)  01/30/23 5\' 5"  (1.651 m)  10/09/22 5\' 3"  (1.6 m)      General:  in no acute distress, wheelchair    irregular heart beat, atrial  fibrillation.  Wheelchair bound but able to transfer. Excess skin after weight  loss, chin and upper arms- bilateral  edema up to the knees. Broken blood vessels.  Lungs are clear to auscultation.   Neurological examination :   Mentation: Alert oriented to time, place, history taking. Follows all commands speech and language fluent, good memory. She has noted word finding slowing, and slurring. Has to deliberately speak slow.  Cranial nerve : Pupils were equal round reactive to light.  Extraocular movements were full, visual field were full on confrontational test. Facial sensation and strength were normal. Tongue in midline, bo fasciculations. .  Head turning and shoulder shrug  were normal and symmetric. Motor: The patient cannot fully extend her right upper extremity at the elbow, but she has good biceps flexion strength. Grip strength is weak and bilaterally there is bilaterally significant atrophy of the thenar eminence noted.  She has not just flattening of the eminence, but a groove,   SENSORY_ numbness, unable to do crossword puzzles and write letters. Her fingers feel stiff. She noted an abdominal and chest spasm, she is unable right now to walk has lower back pain and feels generalized weak and heavy. After has given her some energy back.   As to sensory she noticed ongoing numbness in the fingers of both hands the pounds but no numbness in the legs.  Loss of sensation at the feet and ankle was related to the significant edema.  No longer able to transfer - not standing.    ASSESSMENT AND PLAN  77 y.o. year old female  has a past medical history of Abnormality of gait (03/01/2015), Anxiety, Arthritis, Asthma, Back pain, Bilateral carpal tunnel syndrome (10/04/2020), Cancer (HCC), CHF (congestive heart failure) (HCC), Chronic pain, Complex atypical endometrial hyperplasia, Constipation, Depression, DVT (deep venous thrombosis) (HCC) (1983), Dysrhythmia, Edema, GERD (gastroesophageal reflux disease), HLD (hyperlipidemia), Hypertension, Hypotension, Hypothyroidism,  Joint pain, Multiple sclerosis (HCC), OSA on CPAP, Osteoarthritis, PAF (paroxysmal atrial fibrillation) (HCC), Pneumonia (11/2011), and Sleep apnea. here with:   1.  Severe sleepiness  since Covid, needs 3-5 liters on oxygen. AHI residual was 27.8,   sleep apnea on BiPAP-no longer enough central apneas to cause her atrial fib, but we were unable to control her apnea and she is therefor not a cardioversion candidate.      New machine is an ASV to EPAP 12 cm , min PS 3 and max PS of 13 cm water, with 5 liters of oxygen- feels better , wakes up more refreshed, but is still daytime sleepy.    Toni. Autin has been extremely compliant has used at 100% of the last 30 days compliance by time,  residual AHI was better - 3.8/h. F 20 in medium with small headgear- . Wit the controlled apnea  she may have less atrial fib.       1)   MS , secondary progressive with advanced weakness, unable to stand unable to use her right hand for fine motor activity.  She feels still spasms and uses her baclofen pump.   2) Hypotension, Cushing's or POTS ?  She needs to return on ACTH.   3)  3 month without new UTI ! Now indwelling catheter , Foley . May be better off with a suprapubic for quality of life.   I plan to follow up either personally or through our NP within 7 months.   I would like to thank Dr. Rennis Golden, Md and Jodelle Gross ,  NP Victorino Dike at the facility.  Sherron Monday, Md [address] [place],  [place] [address] for allowing me to meet with  and to take care of this pleasant patient.   After spending a total time of  34  minutes face to face and additional time for physical and neurologic examination, review of laboratory studies,  personal review of imaging studies, reports and results of other testing and review of referral information / records as far as provided in visit,   Electronically signed by: Melvyn Novas, MD 02/21/2023 3:16 PM  [facility] and [facility] Sleep Board  certified by The [employer] of Sleep Medicine and Diplomate of the [employer] of Sleep Medicine. Board certified In Neurology through the ABPN, Fellow of the [employer] of Neu[employer].

## 2023-03-12 ENCOUNTER — Ambulatory Visit (INDEPENDENT_AMBULATORY_CARE_PROVIDER_SITE_OTHER): Payer: Medicare Other | Admitting: Neurology

## 2023-03-12 ENCOUNTER — Ambulatory Visit: Payer: Medicare Other | Admitting: Internal Medicine

## 2023-03-12 ENCOUNTER — Encounter: Payer: Self-pay | Admitting: Neurology

## 2023-03-12 VITALS — BP 105/49 | HR 98

## 2023-03-12 DIAGNOSIS — Z978 Presence of other specified devices: Secondary | ICD-10-CM | POA: Diagnosis not present

## 2023-03-12 DIAGNOSIS — G35 Multiple sclerosis: Secondary | ICD-10-CM

## 2023-03-12 DIAGNOSIS — R252 Cramp and spasm: Secondary | ICD-10-CM

## 2023-03-12 DIAGNOSIS — G8250 Quadriplegia, unspecified: Secondary | ICD-10-CM

## 2023-03-12 NOTE — Progress Notes (Signed)
[facility]  PATIENT: Toni [name] DOB: 07-Jan-1946  REFERRING DOCTOR OR PCP: Dr. Vickey Huger SOURCE: Patient, notes from Dr. Vickey Huger and Dr. Anne Hahn  _________________________________   HISTORICAL  CHIEF COMPLAINT:  Chief Complaint  Patient presents with   Follow-up    Pt in room 10, husband in room.Here for Baclofen pump refill.    HISTORY OF PRESENT ILLNESS:  Toni [name] is a 77 year old woman with multiple sclerosis.  Marland Kitchen    UPDATE 03/12/2023 She comes in today and would need a baclofen pump refill..  She had her pump replaced in 2022 (Dr. Venetia Maxon).     She is in [facility] in [place].   She notes a benefit from the baclofen pump.  She is on third pump - first one 2009, last one July 2022).  In 2023, we increased the pump settings by 5% to 829.6 mcg/day.   That dose initially worked very well for her but she notes that she gets spasticity when she goes to bed around 10 PM.  We discussed adding a bolus.  She does take p.o. baclofen as needed when spasticity is a little worse and she has taken nightly over the past few weeks.  She is written for the 20 mg pills and I discussed that if that makes her sleepy she can take half of the tablet several times a day.    She is unable to walk but assist some with transfers.  She uses a standing frame.  She is able to move her legs in bed and to go on her side in bed.  She also notes weakness in her hands.  She has dysesthesias in her abdomen and legs.  The abdominal dysesthesias seem worse over the past couple months gabapentin has helped.     She has difficulty with a neurogenic bladder and has frequent UTIs.  Spasticity is worse when she is sick.  Vision is doing well. She wasreently hospitalized for UTI (not sepsis).     REVIEW OF SYSTEMS: Constitutional: No fevers, chills, sweats, or change in appetite Eyes: No visual changes, double vision, eye pain Ear, nose and throat: No hearing loss, ear pain,  nasal congestion, sore throat Cardiovascular: No chest pain, palpitations Respiratory:  No shortness of breath at rest or with exertion.   No wheezes GastrointestinaI: No nausea, vomiting, diarrhea, abdominal pain, fecal incontinence Genitourinary:  No dysuria, urinary retention or frequency.  No nocturia. Musculoskeletal:  No neck pain, back pain Integumentary: No rash, pruritus, skin lesions Neurological: as above Psychiatric: No depression at this time.  No anxiety Endocrine: No palpitations, diaphoresis, change in appetite, change in weigh or increased thirst Hematologic/Lymphatic:  No anemia, purpura, petechiae. Allergic/Immunologic: No itchy/runny eyes, nasal congestion, recent allergic reactions, rashes  ALLERGIES: Allergies  Allergen Reactions   Other Hives and Rash    Topical Surgical prep  - severe rash, hives   Hydrocodone Nausea And Vomiting   Oxycontin [Oxycodone] Nausea And Vomiting   Zoloft [Sertraline] Hives, Swelling and Rash   Chloraseptic [Phenol] Hives    Not documented on MAR   Macrobid [Nitrofurantoin] Nausea And Vomiting    Not documented on MAR   Vioxx [Rofecoxib] Other (See Comments)    Unknown reaction Not documented on MAR   Hibiclens [Chlorhexidine Gluconate] Hives, Rash and Other (See Comments)    Sores    HOME MEDICATIONS:  Current Outpatient Medications:    acetaminophen (TYLENOL) 500 MG tablet, Take 1,000 mg by mouth every 8 (eight) hours as needed  for fever or headache (pain)., Disp: , Rfl:    ALPRAZolam (XANAX) 0.5 MG tablet, Take 1 tablet (0.5 mg total) by mouth at bedtime. May also take 1 tablet every 12 hours as needed anxiety (Patient taking differently: Take 0.5 mg by mouth at bedtime.), Disp: 10 tablet, Rfl: 0   apixaban (ELIQUIS) 5 MG TABS tablet, Take 5 mg by mouth 2 (two) times daily., Disp: , Rfl:    baclofen (LIORESAL) 20 MG tablet, Take 20 mg by mouth daily as needed for muscle spasms., Disp: , Rfl:    Biotin 57846 MCG TABS, Take  10,000 mcg by mouth daily after lunch., Disp: , Rfl:    bisacodyl (DULCOLAX) 10 MG suppository, Place 10 mg rectally daily as needed (constipation)., Disp: , Rfl:    buPROPion (WELLBUTRIN XL) 150 MG 24 hr tablet, Take 1 tablet (150 mg total) by mouth daily., Disp: 90 tablet, Rfl: 1   Calcium Carb-Cholecalciferol (CALCIUM 500/D) 500-10 MG-MCG CHEW, Chew 1 tablet by mouth 2 (two) times daily., Disp: , Rfl:    cetirizine (ZYRTEC) 10 MG tablet, Take 10 mg by mouth daily as needed for allergies., Disp: , Rfl:    cholecalciferol 25 MCG (1000 UT) tablet, Take 2,000 Units by mouth 2 (two) times daily with breakfast and lunch., Disp: , Rfl:    corticotropin (ATHACAR H.P.) 80 UNIT/ML injectable gel, Give subcutaneous 80 units every other day for 10 days ( 5 shots ) a month., Disp: 5 mL, Rfl: 5   D-Mannose 500 MG CAPS, Take 2,000 mg by mouth daily., Disp: , Rfl:    diclofenac Sodium (VOLTAREN) 1 % GEL, Apply 2 g topically 3 (three) times daily as needed. (Patient taking differently: Apply 2 g topically 3 (three) times daily as needed (pain). To bilateral shoulders), Disp: 100 g, Rfl: 3   diphenhydrAMINE (BENADRYL) 25 mg capsule, Take 25 mg by mouth 3 (three) times daily as needed (minor itching, irritation)., Disp: , Rfl:    Emollient (CERAVE DAILY MOISTURIZING) LOTN, Apply 1 application  topically daily. To groin, inguinal area, Disp: , Rfl:    escitalopram (LEXAPRO) 10 MG tablet, Take 1 tablet (10 mg total) by mouth daily. (Patient taking differently: Take 10 mg by mouth at bedtime.), Disp: 90 tablet, Rfl: 1   famotidine (PEPCID) 20 MG tablet, Take 20 mg by mouth at bedtime., Disp: , Rfl:    fluticasone (FLONASE) 50 MCG/ACT nasal spray, Place 2 sprays into both nostrils daily., Disp: , Rfl:    gabapentin (NEURONTIN) 300 MG capsule, Mrs. Cressy will take 300 mg of gabapentin at 6 AM, 10 AM, 2 PM and 6 PM and 600 mg at 10 PM by mouth. (Patient taking differently: Take 300 mg by mouth See admin instructions. 300 mg  5 times daily at 0600, 1000, 1400, 1800 and 300-600 mg at bedtime.), Disp: 180 capsule, Rfl: 11   lactulose (CHRONULAC) 10 GM/15ML solution, Take 15 mLs (10 g total) by mouth 2 (two) times daily as needed for mild constipation. (Patient taking differently: Take 10 g by mouth 2 (two) times daily.), Disp: 236 mL, Rfl: 0   levothyroxine (SYNTHROID) 75 MCG tablet, Take 75 mcg by mouth daily before breakfast., Disp: , Rfl:    melatonin 5 MG TABS, Take 5 mg by mouth at bedtime., Disp: , Rfl:    metolazone (ZAROXOLYN) 2.5 MG tablet, Take 2.5 mg by mouth daily as needed (weight gain > 3 lbs in 2 days with swelling.)., Disp: , Rfl:    midodrine (PROAMATINE)  2.5 MG tablet, Take 2 tablets (5 mg total) by mouth 3 (three) times daily as needed (hypotension (systolic blood pressure less than 100))., Disp: , Rfl:    Multiple Vitamins-Minerals (CEROVITE SENIOR) TABS, Take 1 tablet by mouth daily., Disp: , Rfl:    NONFORMULARY OR COMPOUNDED ITEM, Apply 1 Application topically See admin instructions. 1:1 clotrimazole 1% and hydrocortisone 1%. Apply twice daily to perineal region and abdominal folds., Disp: , Rfl:    ondansetron (ZOFRAN-ODT) 4 MG disintegrating tablet, Take 4 mg by mouth 4 (four) times daily., Disp: , Rfl:    oxybutynin (DITROPAN) 5 MG tablet, Take 5 mg by mouth 2 (two) times daily., Disp: , Rfl:    phenazopyridine (PYRIDIUM) 100 MG tablet, Take 100 mg by mouth every 8 (eight) hours as needed (dysuria)., Disp: , Rfl:    phenylephrine-shark liver oil-mineral oil-petrolatum (PREPARATION H) 0.25-14-74.9 % rectal ointment, Place 1 Application rectally every 6 (six) hours as needed (rectum discomfort)., Disp: , Rfl:    potassium chloride SA (KLOR-CON) 20 MEQ tablet, Take 2 tablets (40 mEq total) by mouth daily. (Patient taking differently: Take 40 mEq by mouth 2 (two) times daily.), Disp: 180 tablet, Rfl: 3   promethazine (PHENERGAN) 12.5 MG tablet, Take 12.5 mg by mouth every 8 (eight) hours as needed for  nausea or vomiting., Disp: , Rfl:    Propylene Glycol-Glycerin (ARTIFICIAL TEARS) 1-0.3 % SOLN, Place 1-2 drops into both eyes See admin instructions. 2 entries on MAR: 1) 1 drop into each eye twice daily 2) 2 drops into each eye once daily as needed for dry eyes, Disp: , Rfl:    Prucalopride Succinate (MOTEGRITY) 2 MG TABS, Take 2 mg by mouth daily., Disp: , Rfl:    Psyllium (METAMUCIL PO), Take 1 packet by mouth 2 (two) times daily. Metamucil 2.4g/5.4g powder packets., Disp: , Rfl:    senna (SENOKOT) 8.6 MG TABS tablet, Take 17.2 mg by mouth 2 (two) times daily., Disp: , Rfl:    sodium phosphate (FLEET) 7-19 GM/118ML ENEM, Place 1 enema rectally daily as needed (constipation)., Disp: , Rfl:    torsemide (DEMADEX) 20 MG tablet, Take 1.5 tablets (30 mg total) by mouth 2 (two) times daily., Disp: 270 tablet, Rfl: 3   corticotropin (ACTHAR) 80 UNIT/ML injectable gel, Inject 1 mL (80 Units total) into the skin See admin instructions for 10 days. Inject 80 units as needed (about 5 times a month), Disp: 5 mL, Rfl: 5   metoprolol succinate (TOPROL-XL) 25 MG 24 hr tablet, Take 1 tablet (25 mg total) by mouth daily., Disp: 30 tablet, Rfl: 0  PAST MEDICAL HISTORY: Past Medical History:  Diagnosis Date   Abnormality of gait 03/01/2015   Acute metabolic encephalopathy 06/04/2020   Anxiety    Arthritis    "knees" (01/19/2016)   Asthma    as a child    Atrial fibrillation (HCC)    Back pain    Bilateral carpal tunnel syndrome 10/04/2020   Cancer (HCC)    / RENAL CELL CARCINOMA - GETS CT SCAN EVERY 6 MONTHS TO WATCH    CHF (congestive heart failure) (HCC)    Chronic pain    "nerve pain from the MS"   Complex atypical endometrial hyperplasia    Constipation    COVID-19 virus infection 08/23/2019   Depression    DVT (deep venous thrombosis) (HCC) 1983   "RLE; may have just been phlebitis; it was before the age of dopplers"   Dysrhythmia    afib  Edema    varicose veins with severe venous insuff  in R and L GSV' ablation of R GSV 2012   GERD (gastroesophageal reflux disease)    HLD (hyperlipidemia)    Hypertension    Hypotension    Hypothyroidism    Joint pain    Multiple sclerosis (HCC)    has had this may 1989-[facility] reg doc   OSA on CPAP    bipap machine - SLEEP APNEA WORSE SINCE COVID IN 1/21 PER PATIENT SETTING HAS INCREASED FROM 9 TO 30    Osteoarthritis    PAF (paroxysmal atrial fibrillation) (HCC)    on coumadin; documented on monitor 06/2010 - no longer takes per [facility] 02/09/21   Pneumonia 11/2011   Sleep apnea    uses sleep apnea machine    PAST SURGICAL HISTORY: Past Surgical History:  Procedure Laterality Date   ANTERIOR CERVICAL DECOMP/DISCECTOMY FUSION  01/2004   CARDIAC CATHETERIZATION  06/22/2010   normal coronary arteries, PAF   CATARACT EXTRACTION, BILATERAL Bilateral    CHILD BIRTHS     X2   DILATATION & CURETTAGE/HYSTEROSCOPY WITH MYOSURE N/A 01/15/2020   Procedure: DILATATION & CURETTAGE/HYSTEROSCOPY WITH MYOSURE;  Surgeon: Richardean Chimera, MD;  Location: [facility];  Service: Gynecology;  Laterality: N/A;  pt in wheelchair-has MS   DILATION AND CURETTAGE OF UTERUS     DILATION AND CURETTAGE OF UTERUS N/A 02/16/2020   Procedure: DILATATION AND CURETTAGE;  Surgeon: Carver Fila, MD;  Location: [facility];  Service: Gynecology;  Laterality: N/A;  NOT GENERAL -NO INTUBATION   DNC     INFUSION PUMP IMPLANTATION  ~ 2009   baclofen infusion in lower abd   INTRATHECAL PUMP REVISION N/A 02/18/2021   Procedure: Baclofen Pump replacement with possible revision of catheter;  Surgeon: Maeola Harman, MD;  Location: [facility] OR;  Service: Neurosurgery;  Laterality: N/A;  3C/RM 20   INTRAUTERINE DEVICE (IUD) INSERTION N/A 02/16/2020   Procedure: INTRAUTERINE DEVICE (IUD) INSERTION - MIRENA;  Surgeon: Carver Fila, MD;  Location: [facility];  Service: Gynecology;  Laterality: N/A;   IUD put in uterus     PAIN PUMP REVISION N/A 07/29/2014   Procedure: Baclofen  pump replacement;  Surgeon: Maeola Harman, MD;  Location: [facility];  Service: Neurosurgery;  Laterality: N/A;  Baclofen pump replacement   TONSILLECTOMY AND ADENOIDECTOMY     VARICOSE VEIN SURGERY  ~ 1968   VENOUS ABLATION  12/16/2010   radiofreq ablation -Dr Lynnea Ferrier and [facility]   WISDOM TOOTH EXTRACTION      FAMILY HISTORY: Family History  Problem Relation Age of Onset   Depression Mother    Sudden death Mother    Anxiety disorder Mother    Coronary artery disease Father        at age 57   Hyperlipidemia Father    Thyroid disease Father    Coronary artery disease Maternal Grandmother    Depression Maternal Grandmother    Cancer Paternal Grandmother        breast   Alcoholism Son    Sleep apnea Neg Hx    Multiple sclerosis Neg Hx     SOCIAL HISTORY:  Social History   Socioeconomic History   Marital status: Married    Spouse name: [name]   Number of children: 2   Years of education: COLLEGE   Highest education level: Not on file  Occupational History   Occupation: RETIRED  Tobacco Use   Smoking status: Former    Current packs/day:  0.00    Average packs/day: 0.5 packs/day for 7.0 years (3.5 ttl pk-yrs)    Types: Cigarettes    Start date: 08/21/1969    Quit date: 08/21/1976    Years since quitting: 46.5   Smokeless tobacco: Never  Vaping Use   Vaping status: Never Used  Substance and Sexual Activity   Alcohol use: Not Currently   Drug use: No   Sexual activity: Not Currently    Birth control/protection: Post-menopausal  Other Topics Concern   Not on file  Social History Narrative   Lives in [place] with Husband Windy Fast [phone], [phone]   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   Right handed   Drinks a little caffeine in coffee sometimes    Social Determinants of Health   Financial Resource Strain: Not on file  Food Insecurity: No Food Insecurity (07/14/2022)   Hunger Vital Sign    Worried About Running Out of Food in the Last Year: Never true    Ran Out of Food in the Last  Year: Never true  Transportation Needs: No Transportation Needs (07/15/2022)   PRAPARE - Administrator, Civil Service (Medical): No    Lack of Transportation (Non-Medical): No  Physical Activity: Not on file  Stress: Not on file  Social Connections: Not on file  Intimate Partner Violence: Not At Risk (07/15/2022)   Humiliation, Afraid, Rape, and Kick questionnaire    Fear of Current or Ex-Partner: No    Emotionally Abused: No    Physically Abused: No    Sexually Abused: No     PHYSICAL EXAM  Vitals:   03/12/23 1421  BP: (!) 105/49  Pulse: 98      There is no height or weight on file to calculate BMI.   General: The patient is well-developed and well-nourished and in no acute distress  HEENT:  Head is [place]/AT.    Neurologic Exam  Mental status: The patient is alert and oriented x 3 at the time of the examination. The patient has apparent normal recent and remote memory, with an apparently normal attention span and concentration ability.   .  Cranial nerves: Extraocular movements are full.  Facial strength was normal  Motor:  Muscle bulk reduced at the thenar eminences.  Muscle tone was mildly increased in the legs, right greater than left.  She has reduced grip bilaterally and 3/5 strength in the intrinsic hand muscles.  Strength is 2+ to 3/5 in the left leg again 2/5 in the right leg.  Sensory: Sensory testing is intact to touch x4.  Gait and station: She is unable to stand or walk.   Reflexes: Deep tendon reflexes are symmetric in the arms and absent in the legs.  No ankle clonus.    DIAGNOSTIC DATA (LABS, IMAGING, TESTING) - I reviewed patient records, labs, notes, testing and imaging myself where available.  Lab Results  Component Value Date   WBC 7.1 11/24/2022   HGB 12.0 11/24/2022   HCT 35.4 (L) 11/24/2022   MCV 89.8 11/24/2022   PLT 244 11/24/2022      Component Value Date/Time   NA 134 (L) 11/24/2022 0050   NA 144 12/15/2020 1100   K  3.7 11/24/2022 0050   CL 89 (L) 11/24/2022 0050   CO2 34 (H) 11/24/2022 0050   GLUCOSE 108 (H) 11/24/2022 0050   BUN 18 11/24/2022 0050   BUN 27 12/15/2020 1100   CREATININE 0.79 11/24/2022 0050   CALCIUM 8.5 (L) 11/24/2022 0050   PROT  5.9 (L) 11/24/2022 0050   PROT 5.9 (L) 09/20/2020 1436   ALBUMIN 2.7 (L) 11/24/2022 0050   ALBUMIN 3.9 09/20/2020 1436   AST 15 11/24/2022 0050   ALT 14 11/24/2022 0050   ALKPHOS 71 11/24/2022 0050   BILITOT 0.5 11/24/2022 0050   BILITOT 0.3 09/20/2020 1436   GFRNONAA >60 11/24/2022 0050   GFRAA 90 09/20/2020 1436   Lab Results  Component Value Date   CHOL 111 03/19/2019   HDL 57 03/19/2019   LDLCALC 43 03/19/2019   TRIG 53 03/19/2019   CHOLHDL 3.0 04/22/2010   Lab Results  Component Value Date   HGBA1C 4.9 06/05/2020   No results found for: "VITAMINB12" Lab Results  Component Value Date   TSH 1.111 11/21/2022       ASSESSMENT AND PLAN  Multiple sclerosis, 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   He will be on a continuous rate of 35 mcg/h and have a bolus (approximately 40 mcg) additionally between 10 and 11 PM. She will return in about 3 months for next pump refill.   F/u with Dr. Vickey Huger for other MS / neuro issues  This visit is part of a comprehensive longitudinal care medical relationship regarding the patients primary diagnosis of multiple sclerosis and spastic quadriplegia and related concerns.  Aleczander Fandino A. Epimenio Foot, MD, [facility] 03/12/2023, 5:11 PM Certified in Neurology, Clinical Neurophysiology, Sleep Medicine and Neuroimaging  [facility] [address], [address] [place], [place] [address] [phone]

## 2023-03-12 NOTE — Progress Notes (Signed)
RM 10, with husband. Skin prepped with Betadine and draped in sterile fashion. Center port of implanted pump accessed using 22g huber needle.  Residual fluid measuring 11 ml was removed from the reservoir.  Gablofen 2096mcg/mlx2 instilled into pump. Pump reprogrammed to reflect a volume of 40ml.  ERI is 10/2027. Next appt given for 05/21/2023 at 2:30pm.  Next alarm date: 10/15//2024.    Medication placed in pump: Gablofen 40,035mcg,20ml x2. Lot: [id number]: 04/2025. NDC: [phone]

## 2023-04-05 ENCOUNTER — Ambulatory Visit: Payer: Medicare Other | Admitting: Neurology

## 2023-04-30 ENCOUNTER — Ambulatory Visit: Payer: Medicare Other | Admitting: Physician Assistant

## 2023-05-21 ENCOUNTER — Ambulatory Visit (INDEPENDENT_AMBULATORY_CARE_PROVIDER_SITE_OTHER): Payer: Medicare Other | Admitting: Neurology

## 2023-05-21 ENCOUNTER — Encounter: Payer: Self-pay | Admitting: Neurology

## 2023-05-21 VITALS — BP 104/57 | HR 70

## 2023-05-21 DIAGNOSIS — G35 Multiple sclerosis: Secondary | ICD-10-CM

## 2023-05-21 DIAGNOSIS — R252 Cramp and spasm: Secondary | ICD-10-CM

## 2023-05-21 DIAGNOSIS — G8250 Quadriplegia, unspecified: Secondary | ICD-10-CM

## 2023-05-21 DIAGNOSIS — Z993 Dependence on wheelchair: Secondary | ICD-10-CM

## 2023-05-21 DIAGNOSIS — Z978 Presence of other specified devices: Secondary | ICD-10-CM | POA: Diagnosis not present

## 2023-05-21 NOTE — Progress Notes (Signed)
RM 11, with husband. Skin prepped with Betadine and draped in sterile fashion. Center port of implanted pump accessed using 22g huber needle.  Residual fluid measuring 10 ml was removed from the reservoir.  Gablofen 2048mcg/mlx2 instilled into pump. Pump reprogrammed to reflect a volume of 40ml.  ERI is 10/2027. Next appt given for 07/30/2023 at 2:30pm.  Next alarm date: 08/14/2023.    Medication placed in pump: Gablofen 40,084mcg,20ml x2. Lot: [id number] Exp: 04/2025. NDC: [id number].

## 2023-05-21 NOTE — Progress Notes (Signed)
[facility]  PATIENT: Toni [name] DOB: 24-Jul-1946  REFERRING DOCTOR OR PCP: Dr. Vickey Huger SOURCE: Patient, notes from Dr. Vickey Huger and Dr. Anne Hahn  _________________________________   HISTORICAL  CHIEF COMPLAINT:  Chief Complaint  Patient presents with   Follow-up    Pt in room 11, husband in room. Here for baclofen pump.    HISTORY OF PRESENT ILLNESS:  Toni [name] is a 77 year old woman with multiple sclerosis.  Marland Kitchen    UPDATE 05/21/2023 She reports that she is doing about the same.  She has spasticity that is well-controlled with the baclofen pump.  She requires refills every 2 to 3 months.   She had her pump replaced in 2022 (Dr. Venetia Maxon).     She is in [facility] in [place].   She receives a benefit from the baclofen pump.  She is on third pump - first one 2009, last one July 2022).  In 2023, we increased the pump settings by 5% to 829.6 mcg/day.  More recently we added a bolus at 10 PM..  She does take p.o. baclofen as needed when spasticity is a little worse and she has taken nightly over the past few weeks.  She is written for the 20 mg pills and I discussed that if that makes her sleepy she can take half of the tablet several times a day.    She is unable to walk but can assist some with transfers.  She uses a standing frame.  She is able to move her legs in bed and to go on her side in bed.  She also notes weakness in her hands.  She has dysesthesias in her abdomen and legs.  Gabapentin is helping some..     She has difficulty with a neurogenic bladder and has frequent UTIs.  Spasticity is worse when she is sick.  Vision is doing well. She wasreently hospitalized for UTI (not sepsis).     REVIEW OF SYSTEMS: Constitutional: No fevers, chills, sweats, or change in appetite Eyes: No visual changes, double vision, eye pain Ear, nose and throat: No hearing loss, ear pain, nasal congestion, sore throat Cardiovascular: No chest pain,  palpitations Respiratory:  No shortness of breath at rest or with exertion.   No wheezes GastrointestinaI: No nausea, vomiting, diarrhea, abdominal pain, fecal incontinence Genitourinary:  No dysuria, urinary retention or frequency.  No nocturia. Musculoskeletal:  No neck pain, back pain Integumentary: No rash, pruritus, skin lesions Neurological: as above Psychiatric: No depression at this time.  No anxiety Endocrine: No palpitations, diaphoresis, change in appetite, change in weigh or increased thirst Hematologic/Lymphatic:  No anemia, purpura, petechiae. Allergic/Immunologic: No itchy/runny eyes, nasal congestion, recent allergic reactions, rashes  ALLERGIES: Allergies  Allergen Reactions   Other Hives and Rash    Topical Surgical prep  - severe rash, hives   Hydrocodone Nausea And Vomiting   Oxycontin [Oxycodone] Nausea And Vomiting   Zoloft [Sertraline] Hives, Swelling and Rash   Chloraseptic [Phenol] Hives    Not documented on MAR   Macrobid [Nitrofurantoin] Nausea And Vomiting    Not documented on MAR   Vioxx [Rofecoxib] Other (See Comments)    Unknown reaction Not documented on MAR   Hibiclens [Chlorhexidine Gluconate] Hives, Rash and Other (See Comments)    Sores    HOME MEDICATIONS:  Current Outpatient Medications:    acetaminophen (TYLENOL) 500 MG tablet, Take 1,000 mg by mouth every 8 (eight) hours as needed for fever or headache (pain)., Disp: , Rfl:  ALPRAZolam (XANAX) 0.5 MG tablet, Take 1 tablet (0.5 mg total) by mouth at bedtime. May also take 1 tablet every 12 hours as needed anxiety (Patient taking differently: Take 0.5 mg by mouth at bedtime.), Disp: 10 tablet, Rfl: 0   apixaban (ELIQUIS) 5 MG TABS tablet, Take 5 mg by mouth 2 (two) times daily., Disp: , Rfl:    baclofen (LIORESAL) 20 MG tablet, Take 20 mg by mouth daily as needed for muscle spasms., Disp: , Rfl:    Biotin 36644 MCG TABS, Take 10,000 mcg by mouth daily after lunch., Disp: , Rfl:     bisacodyl (DULCOLAX) 10 MG suppository, Place 10 mg rectally daily as needed (constipation)., Disp: , Rfl:    buPROPion (WELLBUTRIN XL) 150 MG 24 hr tablet, Take 1 tablet (150 mg total) by mouth daily., Disp: 90 tablet, Rfl: 1   Calcium Carb-Cholecalciferol (CALCIUM 500/D) 500-10 MG-MCG CHEW, Chew 1 tablet by mouth 2 (two) times daily., Disp: , Rfl:    cetirizine (ZYRTEC) 10 MG tablet, Take 10 mg by mouth daily as needed for allergies., Disp: , Rfl:    cholecalciferol 25 MCG (1000 UT) tablet, Take 2,000 Units by mouth 2 (two) times daily with breakfast and lunch., Disp: , Rfl:    corticotropin (ATHACAR H.P.) 80 UNIT/ML injectable gel, Give subcutaneous 80 units every other day for 10 days ( 5 shots ) a month., Disp: 5 mL, Rfl: 5   D-Mannose 500 MG CAPS, Take 2,000 mg by mouth daily., Disp: , Rfl:    diclofenac Sodium (VOLTAREN) 1 % GEL, Apply 2 g topically 3 (three) times daily as needed. (Patient taking differently: Apply 2 g topically 3 (three) times daily as needed (pain). To bilateral shoulders), Disp: 100 g, Rfl: 3   diphenhydrAMINE (BENADRYL) 25 mg capsule, Take 25 mg by mouth 3 (three) times daily as needed (minor itching, irritation)., Disp: , Rfl:    Emollient (CERAVE DAILY MOISTURIZING) LOTN, Apply 1 application  topically daily. To groin, inguinal area, Disp: , Rfl:    escitalopram (LEXAPRO) 10 MG tablet, Take 1 tablet (10 mg total) by mouth daily. (Patient taking differently: Take 10 mg by mouth at bedtime.), Disp: 90 tablet, Rfl: 1   famotidine (PEPCID) 20 MG tablet, Take 20 mg by mouth at bedtime., Disp: , Rfl:    fluticasone (FLONASE) 50 MCG/ACT nasal spray, Place 2 sprays into both nostrils daily., Disp: , Rfl:    gabapentin (NEURONTIN) 300 MG capsule, Mrs. Follin will take 300 mg of gabapentin at 6 AM, 10 AM, 2 PM and 6 PM and 600 mg at 10 PM by mouth. (Patient taking differently: Take 300 mg by mouth See admin instructions. 300 mg 5 times daily at 0600, 1000, 1400, 1800 and 300-600 mg at  bedtime.), Disp: 180 capsule, Rfl: 11   lactulose (CHRONULAC) 10 GM/15ML solution, Take 15 mLs (10 g total) by mouth 2 (two) times daily as needed for mild constipation. (Patient taking differently: Take 10 g by mouth 2 (two) times daily.), Disp: 236 mL, Rfl: 0   levothyroxine (SYNTHROID) 75 MCG tablet, Take 75 mcg by mouth daily before breakfast., Disp: , Rfl:    melatonin 5 MG TABS, Take 5 mg by mouth at bedtime., Disp: , Rfl:    metolazone (ZAROXOLYN) 2.5 MG tablet, Take 2.5 mg by mouth daily as needed (weight gain > 3 lbs in 2 days with swelling.)., Disp: , Rfl:    midodrine (PROAMATINE) 2.5 MG tablet, Take 2 tablets (5 mg total) by mouth  3 (three) times daily as needed (hypotension (systolic blood pressure less than 100))., Disp: , Rfl:    Multiple Vitamins-Minerals (CEROVITE SENIOR) TABS, Take 1 tablet by mouth daily., Disp: , Rfl:    NONFORMULARY OR COMPOUNDED ITEM, Apply 1 Application topically See admin instructions. 1:1 clotrimazole 1% and hydrocortisone 1%. Apply twice daily to perineal region and abdominal folds., Disp: , Rfl:    ondansetron (ZOFRAN-ODT) 4 MG disintegrating tablet, Take 4 mg by mouth 4 (four) times daily., Disp: , Rfl:    oxybutynin (DITROPAN) 5 MG tablet, Take 5 mg by mouth 2 (two) times daily., Disp: , Rfl:    phenazopyridine (PYRIDIUM) 100 MG tablet, Take 100 mg by mouth every 8 (eight) hours as needed (dysuria)., Disp: , Rfl:    phenylephrine-shark liver oil-mineral oil-petrolatum (PREPARATION H) 0.25-14-74.9 % rectal ointment, Place 1 Application rectally every 6 (six) hours as needed (rectum discomfort)., Disp: , Rfl:    potassium chloride SA (KLOR-CON) 20 MEQ tablet, Take 2 tablets (40 mEq total) by mouth daily. (Patient taking differently: Take 40 mEq by mouth 2 (two) times daily.), Disp: 180 tablet, Rfl: 3   promethazine (PHENERGAN) 12.5 MG tablet, Take 12.5 mg by mouth every 8 (eight) hours as needed for nausea or vomiting., Disp: , Rfl:    Propylene  Glycol-Glycerin (ARTIFICIAL TEARS) 1-0.3 % SOLN, Place 1-2 drops into both eyes See admin instructions. 2 entries on MAR: 1) 1 drop into each eye twice daily 2) 2 drops into each eye once daily as needed for dry eyes, Disp: , Rfl:    Prucalopride Succinate (MOTEGRITY) 2 MG TABS, Take 2 mg by mouth daily., Disp: , Rfl:    Psyllium (METAMUCIL PO), Take 1 packet by mouth 2 (two) times daily. Metamucil 2.4g/5.4g powder packets., Disp: , Rfl:    senna (SENOKOT) 8.6 MG TABS tablet, Take 17.2 mg by mouth 2 (two) times daily., Disp: , Rfl:    sodium phosphate (FLEET) 7-19 GM/118ML ENEM, Place 1 enema rectally daily as needed (constipation)., Disp: , Rfl:    torsemide (DEMADEX) 20 MG tablet, Take 1.5 tablets (30 mg total) by mouth 2 (two) times daily., Disp: 270 tablet, Rfl: 3   corticotropin (ACTHAR) 80 UNIT/ML injectable gel, Inject 1 mL (80 Units total) into the skin See admin instructions for 10 days. Inject 80 units as needed (about 5 times a month), Disp: 5 mL, Rfl: 5   metoprolol succinate (TOPROL-XL) 25 MG 24 hr tablet, Take 1 tablet (25 mg total) by mouth daily., Disp: 30 tablet, Rfl: 0  PAST MEDICAL HISTORY: Past Medical History:  Diagnosis Date   Abnormality of gait 03/01/2015   Acute metabolic encephalopathy 06/04/2020   Anxiety    Arthritis    "knees" (01/19/2016)   Asthma    as a child    Atrial fibrillation (HCC)    Back pain    Bilateral carpal tunnel syndrome 10/04/2020   Cancer (HCC)    / RENAL CELL CARCINOMA - GETS CT SCAN EVERY 6 MONTHS TO WATCH    CHF (congestive heart failure) (HCC)    Chronic pain    "nerve pain from the MS"   Complex atypical endometrial hyperplasia    Constipation    COVID-19 virus infection 08/23/2019   Depression    DVT (deep venous thrombosis) (HCC) 1983   "RLE; may have just been phlebitis; it was before the age of dopplers"   Dysrhythmia    afib    Edema    varicose veins with severe venous  insuff in R and L GSV' ablation of R GSV 2012   GERD  (gastroesophageal reflux disease)    HLD (hyperlipidemia)    Hypertension    Hypotension    Hypothyroidism    Joint pain    Multiple sclerosis (HCC)    has had this may 1989-[place] reg doc   OSA on CPAP    bipap machine - SLEEP APNEA WORSE SINCE COVID IN 1/21 PER PATIENT SETTING HAS INCREASED FROM 9 TO 30    Osteoarthritis    PAF (paroxysmal atrial fibrillation) (HCC)    on coumadin; documented on monitor 06/2010 - no longer takes per [facility] 02/09/21   Pneumonia 11/2011   Sleep apnea    uses sleep apnea machine    PAST SURGICAL HISTORY: Past Surgical History:  Procedure Laterality Date   ANTERIOR CERVICAL DECOMP/DISCECTOMY FUSION  01/2004   CARDIAC CATHETERIZATION  06/22/2010   normal coronary arteries, PAF   CATARACT EXTRACTION, BILATERAL Bilateral    CHILD BIRTHS     X2   DILATATION & CURETTAGE/HYSTEROSCOPY WITH MYOSURE N/A 01/15/2020   Procedure: DILATATION & CURETTAGE/HYSTEROSCOPY WITH MYOSURE;  Surgeon: Richardean Chimera, MD;  Location: [facility];  Service: Gynecology;  Laterality: N/A;  pt in wheelchair-has MS   DILATION AND CURETTAGE OF UTERUS     DILATION AND CURETTAGE OF UTERUS N/A 02/16/2020   Procedure: DILATATION AND CURETTAGE;  Surgeon: Carver Fila, MD;  Location: [facility];  Service: Gynecology;  Laterality: N/A;  NOT GENERAL -NO INTUBATION   DNC     INFUSION PUMP IMPLANTATION  ~ 2009   baclofen infusion in lower abd   INTRATHECAL PUMP REVISION N/A 02/18/2021   Procedure: Baclofen Pump replacement with possible revision of catheter;  Surgeon: Maeola Harman, MD;  Location[facility];  Service: Neurosurgery;  Laterality: N/A;  3C/RM 20   INTRAUTERINE DEVICE (IUD) INSERTION N/A 02/16/2020   Procedure: INTRAUTERINE DEVICE (IUD) INSERTION - MIRENA;  Surgeon: Carver Fila, MD;  Location: [facility];  Service: Gynecology;  Laterality: N/A;   IUD put in uterus     PAIN PUMP REVISION N/A 07/29/2014   Procedure: Baclofen pump replacement;  Surgeon: Maeola Harman, MD;   Location: [facility];  Service: Neurosurgery;  Laterality: N/A;  Baclofen pump replacement   TONSILLECTOMY AND ADENOIDECTOMY     VARICOSE VEIN SURGERY  ~ 1968   VENOUS ABLATION  12/16/2010   radiofreq ablation -Dr Lynnea Ferrier and [facility]   WISDOM TOOTH EXTRACTION      FAMILY HISTORY: Family History  Problem Relation Age of Onset   Depression Mother    Sudden death Mother    Anxiety disorder Mother    Coronary artery disease Father        at age 76   Hyperlipidemia Father    Thyroid disease Father    Coronary artery disease Maternal Grandmother    Depression Maternal Grandmother    Cancer Paternal Grandmother        breast   Alcoholism Son    Sleep apnea Neg Hx    Multiple sclerosis Neg Hx     SOCIAL HISTORY:  Social History   Socioeconomic History   Marital status: Married    Spouse name: [name]   Number of children: 2   Years of education: COLLEGE   Highest education level: Not on file  Occupational History   Occupation: RETIRED  Tobacco Use   Smoking status: Former    Current packs/day: 0.00    Average packs/day: 0.5 packs/day for  7.0 years (3.5 ttl pk-yrs)    Types: Cigarettes    Start date: 08/21/1969    Quit date: 08/21/1976    Years since quitting: 46.7   Smokeless tobacco: Never  Vaping Use   Vaping status: Never Used  Substance and Sexual Activity   Alcohol use: Not Currently   Drug use: No   Sexual activity: Not Currently    Birth control/protection: Post-menopausal  Other Topics Concern   Not on file  Social History Narrative   Lives in [place] with Husband Windy Fast [phone], [phone]   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   Right handed   Drinks a little caffeine in coffee sometimes    Social Determinants of Health   Financial Resource Strain: Not on file  Food Insecurity: No Food Insecurity (07/14/2022)   Hunger Vital Sign    Worried About Running Out of Food in the Last Year: Never true    Ran Out of Food in the Last Year: Never true  Transportation Needs: No  Transportation Needs (07/15/2022)   PRAPARE - Administrator, Civil Service (Medical): No    Lack of Transportation (Non-Medical): No  Physical Activity: Not on file  Stress: Not on file  Social Connections: Not on file  Intimate Partner Violence: Not At Risk (07/15/2022)   Humiliation, Afraid, Rape, and Kick questionnaire    Fear of Current or Ex-Partner: No    Emotionally Abused: No    Physically Abused: No    Sexually Abused: No     PHYSICAL EXAM  Vitals:   05/21/23 1423  BP: (!) 104/57  Pulse: 70      There is no height or weight on file to calculate BMI.   General: The patient is well-developed and well-nourished and in no acute distress  HEENT:  Head is [place]/AT.    Neurologic Exam  Mental status: The patient is alert and oriented x 3 at the time of the examination. The patient has apparent normal recent and remote memory, with an apparently normal attention span and concentration ability.   .  Cranial nerves: Extraocular movements are full.  Facial strength was normal  Motor:  Muscle bulk reduced at the thenar eminences.  She has slightly increased muscle tone in the legs.  There were no phasic spasms.  She has reduced grip bilaterally and 3/5 strength in the intrinsic hand muscles.  Strength is 2+ to 3/5 in the left leg again 2/5 in the right leg.  Sensory: Sensory testing is intact to touch x4.  Gait and station: She is unable to stand or walk.   Reflexes: Deep tendon reflexes are symmetric in the arms and absent in the legs.  No ankle clonus.    DIAGNOSTIC DATA (LABS, IMAGING, TESTING) - I reviewed patient records, labs, notes, testing and imaging myself where available.  Lab Results  Component Value Date   WBC 7.1 11/24/2022   HGB 12.0 11/24/2022   HCT 35.4 (L) 11/24/2022   MCV 89.8 11/24/2022   PLT 244 11/24/2022      Component Value Date/Time   NA 134 (L) 11/24/2022 0050   NA 144 12/15/2020 1100   K 3.7 11/24/2022 0050   CL 89 (L)  11/24/2022 0050   CO2 34 (H) 11/24/2022 0050   GLUCOSE 108 (H) 11/24/2022 0050   BUN 18 11/24/2022 0050   BUN 27 12/15/2020 1100   CREATININE 0.79 11/24/2022 0050   CALCIUM 8.5 (L) 11/24/2022 0050   PROT 5.9 (L) 11/24/2022 0050  PROT 5.9 (L) 09/20/2020 1436   ALBUMIN 2.7 (L) 11/24/2022 0050   ALBUMIN 3.9 09/20/2020 1436   AST 15 11/24/2022 0050   ALT 14 11/24/2022 0050   ALKPHOS 71 11/24/2022 0050   BILITOT 0.5 11/24/2022 0050   BILITOT 0.3 09/20/2020 1436   GFRNONAA >60 11/24/2022 0050   GFRAA 90 09/20/2020 1436   Lab Results  Component Value Date   CHOL 111 03/19/2019   HDL 57 03/19/2019   LDLCALC 43 03/19/2019   TRIG 53 03/19/2019   CHOLHDL 3.0 04/22/2010   Lab Results  Component Value Date   HGBA1C 4.9 06/05/2020   No results found for: "VITAMINB12" Lab Results  Component Value Date   TSH 1.111 11/21/2022       ASSESSMENT AND PLAN  Multiple sclerosis, 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   She will do a continuous rate of 35 mcg/h and have a bolus (approximately 40 mcg) additionally between 10 and 11 PM. She will return to see [name] in about 3 months for the next pump refill.  She should call us or Dr. Vickey Huger sooner for new or worsening symptoms..   F/u with Dr. Vickey Huger for other MS / neuro issues  This visit is part of a comprehensive longitudinal care medical relationship regarding the patients primary diagnosis of multiple sclerosis and spastic quadriplegia and related concerns.  Fynn Vanblarcom A. Epimenio Foot, MD, [facility] 05/21/2023, 3:06 PM Certified in Neurology, Clinical Neurophysiology, Sleep Medicine and Neuroimaging  [facility] [address] [place], [place] [address] [phone]

## 2023-05-31 ENCOUNTER — Telehealth: Payer: Self-pay | Admitting: Internal Medicine

## 2023-05-31 NOTE — Telephone Encounter (Signed)
Called and spoke with patient. Pt reports that her BP has been low lately. Her BP this morning was 95/62 and yesterday it was 92/48. She wants to know with her BP being that low if she should be taking Metoprolol and Midodrine.?Pt stated she has been refusing her Metoprolol since her BP is already low. Please advise.

## 2023-05-31 NOTE — Telephone Encounter (Signed)
Pt c/o medication issue:  1. Name of Medication:   midodrine (PROAMATINE) 2.5 MG tablet    metoprolol succinate (TOPROL-XL) 25 MG 24 hr tablet (Expired)    2. How are you currently taking this medication (dosage and times per day)?   3. Are you having a reaction (difficulty breathing--STAT)? No  4. What is your medication issue? Pt would like a callback regarding whether she is to be taking both above medications for BP. Please advise

## 2023-05-31 NOTE — Telephone Encounter (Signed)
Pt is in a nursing facility and they a requiring an order with parameters on when to hold Metoprolol and give Midodrine.

## 2023-05-31 NOTE — Telephone Encounter (Signed)
Message relayed  to patient. Patient verbalized understanding and has no questions at this time.

## 2023-06-08 ENCOUNTER — Telehealth: Payer: Self-pay | Admitting: *Deleted

## 2023-06-08 NOTE — Telephone Encounter (Signed)
With new onset bleeding, exam will be helpful to determine if vaginal or from her urethra. Could she talk to her caretakers about whether they could tell? Yes, she can schedule a visit to see Dr. Arelia Sneddon. If she is unable to get in with him, we are happy to see her back her.

## 2023-06-08 NOTE — Telephone Encounter (Signed)
Spoke with Toni [name] who called the office stating she started having vaginal spotting a couple months ago. Pt states the blood is brown in color and she has only needed to change a pad once a day for hygiene only. Pt denies any pain associated with the bleeding, fever and or chills. Pt resides in a nursing home and she has an indwelling catheter and is not sure if the bleeding was coming from the catheter or vagina, she has care takers that help her with bathing and it's hard for her to tell.   Pt states she was released from Dr. Winferd Humphrey care back in December of 2023 at that time recommendations were to follow up with her OB/Gyn yearly. Pt states she hasn't followed up with them. Pt states she will call Dr. Lisbeth Ply office on Monday when they open. Advised patient that her message would be relayed to provider and to monitor her bleeding, if the bleeding increases, changes to bright red and or has clots over the weekend to go to ED for evaluation. Pt verbalized understanding.

## 2023-06-08 NOTE — Telephone Encounter (Addendum)
Spoke with Toni [name] and she said the caretakers were having a hard time determining where the bleeding and or brown discharge is coming from because when they wipe the vaginal area there is no discharge. Pt is going to ask them again to look closely at the urethra where the catheter is inserted. Advised patient if she can't get in to see Dr. Arelia Sneddon, to call the office back and we can schedule appointment with Dr. Pricilla Holm. Pt verbalized understanding and thanked the office.

## 2023-06-14 ENCOUNTER — Inpatient Hospital Stay (HOSPITAL_COMMUNITY)
Admission: EM | Admit: 2023-06-14 | Discharge: 2023-06-16 | DRG: 640 | Disposition: A | Discharge status: Skilled Nursing Facility | Payer: Medicare Other | Source: Skilled Nursing Facility | Attending: Family Medicine | Admitting: Family Medicine

## 2023-06-14 ENCOUNTER — Other Ambulatory Visit: Payer: Self-pay

## 2023-06-14 ENCOUNTER — Encounter (HOSPITAL_COMMUNITY): Payer: Self-pay

## 2023-06-14 DIAGNOSIS — E876 Hypokalemia: Secondary | ICD-10-CM | POA: Diagnosis present

## 2023-06-14 DIAGNOSIS — Z79890 Hormone replacement therapy (postmenopausal): Secondary | ICD-10-CM

## 2023-06-14 DIAGNOSIS — E240 Pituitary-dependent Cushing's disease: Secondary | ICD-10-CM | POA: Diagnosis present

## 2023-06-14 DIAGNOSIS — K5909 Other constipation: Secondary | ICD-10-CM | POA: Diagnosis present

## 2023-06-14 DIAGNOSIS — G8250 Quadriplegia, unspecified: Secondary | ICD-10-CM | POA: Diagnosis present

## 2023-06-14 DIAGNOSIS — Z83438 Family history of other disorder of lipoprotein metabolism and other lipidemia: Secondary | ICD-10-CM

## 2023-06-14 DIAGNOSIS — Z9689 Presence of other specified functional implants: Secondary | ICD-10-CM | POA: Diagnosis present

## 2023-06-14 DIAGNOSIS — F32A Depression, unspecified: Secondary | ICD-10-CM | POA: Diagnosis present

## 2023-06-14 DIAGNOSIS — Z993 Dependence on wheelchair: Secondary | ICD-10-CM

## 2023-06-14 DIAGNOSIS — K219 Gastro-esophageal reflux disease without esophagitis: Secondary | ICD-10-CM | POA: Diagnosis present

## 2023-06-14 DIAGNOSIS — I5030 Unspecified diastolic (congestive) heart failure: Secondary | ICD-10-CM | POA: Diagnosis present

## 2023-06-14 DIAGNOSIS — N319 Neuromuscular dysfunction of bladder, unspecified: Secondary | ICD-10-CM | POA: Diagnosis present

## 2023-06-14 DIAGNOSIS — N39490 Overflow incontinence: Secondary | ICD-10-CM | POA: Diagnosis present

## 2023-06-14 DIAGNOSIS — Z9989 Dependence on other enabling machines and devices: Secondary | ICD-10-CM

## 2023-06-14 DIAGNOSIS — Z978 Presence of other specified devices: Secondary | ICD-10-CM

## 2023-06-14 DIAGNOSIS — I5032 Chronic diastolic (congestive) heart failure: Secondary | ICD-10-CM | POA: Diagnosis present

## 2023-06-14 DIAGNOSIS — N390 Urinary tract infection, site not specified: Secondary | ICD-10-CM | POA: Diagnosis present

## 2023-06-14 DIAGNOSIS — G4731 Primary central sleep apnea: Secondary | ICD-10-CM | POA: Diagnosis present

## 2023-06-14 DIAGNOSIS — E249 Cushing's syndrome, unspecified: Secondary | ICD-10-CM | POA: Diagnosis present

## 2023-06-14 DIAGNOSIS — G35 Multiple sclerosis: Secondary | ICD-10-CM | POA: Diagnosis present

## 2023-06-14 DIAGNOSIS — Z87891 Personal history of nicotine dependence: Secondary | ICD-10-CM

## 2023-06-14 DIAGNOSIS — J9611 Chronic respiratory failure with hypoxia: Secondary | ICD-10-CM | POA: Diagnosis present

## 2023-06-14 DIAGNOSIS — F418 Other specified anxiety disorders: Secondary | ICD-10-CM | POA: Diagnosis present

## 2023-06-14 DIAGNOSIS — E871 Hypo-osmolality and hyponatremia: Secondary | ICD-10-CM | POA: Diagnosis not present

## 2023-06-14 DIAGNOSIS — I9589 Other hypotension: Secondary | ICD-10-CM | POA: Diagnosis present

## 2023-06-14 DIAGNOSIS — E039 Hypothyroidism, unspecified: Secondary | ICD-10-CM | POA: Diagnosis present

## 2023-06-14 DIAGNOSIS — I4821 Permanent atrial fibrillation: Secondary | ICD-10-CM | POA: Diagnosis present

## 2023-06-14 DIAGNOSIS — Z79899 Other long term (current) drug therapy: Secondary | ICD-10-CM

## 2023-06-14 DIAGNOSIS — I110 Hypertensive heart disease with heart failure: Secondary | ICD-10-CM | POA: Diagnosis present

## 2023-06-14 DIAGNOSIS — Z8349 Family history of other endocrine, nutritional and metabolic diseases: Secondary | ICD-10-CM

## 2023-06-14 DIAGNOSIS — Z86718 Personal history of other venous thrombosis and embolism: Secondary | ICD-10-CM

## 2023-06-14 DIAGNOSIS — Z7901 Long term (current) use of anticoagulants: Secondary | ICD-10-CM

## 2023-06-14 DIAGNOSIS — Z888 Allergy status to other drugs, medicaments and biological substances status: Secondary | ICD-10-CM

## 2023-06-14 DIAGNOSIS — Z85528 Personal history of other malignant neoplasm of kidney: Secondary | ICD-10-CM

## 2023-06-14 DIAGNOSIS — F39 Unspecified mood [affective] disorder: Secondary | ICD-10-CM | POA: Diagnosis present

## 2023-06-14 DIAGNOSIS — Z8616 Personal history of COVID-19: Secondary | ICD-10-CM

## 2023-06-14 DIAGNOSIS — E785 Hyperlipidemia, unspecified: Secondary | ICD-10-CM | POA: Diagnosis present

## 2023-06-14 DIAGNOSIS — Z8249 Family history of ischemic heart disease and other diseases of the circulatory system: Secondary | ICD-10-CM

## 2023-06-14 DIAGNOSIS — F419 Anxiety disorder, unspecified: Secondary | ICD-10-CM | POA: Diagnosis present

## 2023-06-14 DIAGNOSIS — N3289 Other specified disorders of bladder: Secondary | ICD-10-CM | POA: Diagnosis present

## 2023-06-14 LAB — BASIC METABOLIC PANEL
Anion gap: 12 (ref 5–15)
Anion gap: 9 (ref 5–15)
Anion gap: 10 (ref 5–15)
BUN: 21 mg/dL (ref 8–23)
BUN: 20 mg/dL (ref 8–23)
BUN: 18 mg/dL (ref 8–23)
CO2: 31 mmol/L (ref 22–32)
CO2: 34 mmol/L — ABNORMAL HIGH (ref 22–32)
CO2: 32 mmol/L (ref 22–32)
Calcium: 8.4 mg/dL — ABNORMAL LOW (ref 8.9–10.3)
Calcium: 8.3 mg/dL — ABNORMAL LOW (ref 8.9–10.3)
Calcium: 8.4 mg/dL — ABNORMAL LOW (ref 8.9–10.3)
Chloride: 82 mmol/L — ABNORMAL LOW (ref 98–111)
Chloride: 83 mmol/L — ABNORMAL LOW (ref 98–111)
Chloride: 84 mmol/L — ABNORMAL LOW (ref 98–111)
Creatinine, Ser: 0.72 mg/dL (ref 0.44–1.00)
Creatinine, Ser: 0.83 mg/dL (ref 0.44–1.00)
Creatinine, Ser: 0.67 mg/dL (ref 0.44–1.00)
GFR, Estimated: >60 mL/min (ref >60)
GFR, Estimated: >60 mL/min (ref >60)
GFR, Estimated: >60 mL/min (ref >60)
Glucose, Bld: 83 mg/dL (ref 70–99)
Glucose, Bld: 124 mg/dL — ABNORMAL HIGH (ref 70–99)
Glucose, Bld: 123 mg/dL — ABNORMAL HIGH (ref 70–99)
Potassium: 3.3 mmol/L — ABNORMAL LOW (ref 3.5–5.1)
Potassium: 4.1 mmol/L (ref 3.5–5.1)
Potassium: 4.5 mmol/L (ref 3.5–5.1)
Sodium: 125 mmol/L — ABNORMAL LOW (ref 135–145)
Sodium: 126 mmol/L — ABNORMAL LOW (ref 135–145)
Sodium: 126 mmol/L — ABNORMAL LOW (ref 135–145)

## 2023-06-14 LAB — OSMOLALITY, URINE: Osmolality, Ur: 149 mosm/kg — ABNORMAL LOW (ref 300–900)

## 2023-06-14 LAB — CBC WITH DIFFERENTIAL/PLATELET
Abs Immature Granulocytes: 0.02 10*3/uL (ref 0.00–0.07)
Basophils Absolute: 0.1 10*3/uL (ref 0.0–0.1)
Basophils Relative: 1 %
Eosinophils Absolute: 0.3 10*3/uL (ref 0.0–0.5)
Eosinophils Relative: 4 %
HCT: 36.4 % (ref 36.0–46.0)
Hemoglobin: 12.9 g/dL (ref 12.0–15.0)
Immature Granulocytes: 0 %
Lymphocytes Relative: 41 %
Lymphs Abs: 3.3 10*3/uL (ref 0.7–4.0)
MCH: 31.2 pg (ref 26.0–34.0)
MCHC: 35.4 g/dL (ref 30.0–36.0)
MCV: 88.1 fL (ref 80.0–100.0)
Monocytes Absolute: 0.9 10*3/uL (ref 0.1–1.0)
Monocytes Relative: 12 %
Neutro Abs: 3.3 10*3/uL (ref 1.7–7.7)
Neutrophils Relative %: 42 %
Platelets: 248 10*3/uL (ref 150–400)
RBC: 4.13 MIL/uL (ref 3.87–5.11)
RDW: 11.4 % — ABNORMAL LOW (ref 11.5–15.5)
WBC: 7.8 10*3/uL (ref 4.0–10.5)
nRBC: 0 % (ref 0.0–0.2)

## 2023-06-14 LAB — COMPREHENSIVE METABOLIC PANEL
ALT: 10 U/L (ref 0–44)
AST: 19 U/L (ref 15–41)
Albumin: 3.3 g/dL — ABNORMAL LOW (ref 3.5–5.0)
Alkaline Phosphatase: 60 U/L (ref 38–126)
Anion gap: 11 (ref 5–15)
BUN: 23 mg/dL (ref 8–23)
CO2: 33 mmol/L — ABNORMAL HIGH (ref 22–32)
Calcium: 8.9 mg/dL (ref 8.9–10.3)
Chloride: 79 mmol/L — ABNORMAL LOW (ref 98–111)
Creatinine, Ser: 0.9 mg/dL (ref 0.44–1.00)
GFR, Estimated: >60 mL/min (ref >60)
Glucose, Bld: 80 mg/dL (ref 70–99)
Potassium: 3.5 mmol/L (ref 3.5–5.1)
Sodium: 123 mmol/L — ABNORMAL LOW (ref 135–145)
Total Bilirubin: 0.6 mg/dL (ref 0.3–1.2)
Total Protein: 6.2 g/dL — ABNORMAL LOW (ref 6.5–8.1)

## 2023-06-14 LAB — URINALYSIS, ROUTINE W REFLEX MICROSCOPIC
Bilirubin Urine: NEGATIVE
Glucose, UA: NEGATIVE mg/dL
Ketones, ur: NEGATIVE mg/dL
Nitrite: NEGATIVE
Protein, ur: NEGATIVE mg/dL
Specific Gravity, Urine: 1.005 (ref 1.005–1.030)
pH: 7 (ref 5.0–8.0)

## 2023-06-14 LAB — T4, FREE: Free T4: 2.18 ng/dL — ABNORMAL HIGH (ref 0.61–1.12)

## 2023-06-14 LAB — NA AND K (SODIUM & POTASSIUM), RAND UR
Potassium Urine: 27 mmol/L
Sodium, Ur: <10 mmol/L

## 2023-06-14 LAB — OSMOLALITY: Osmolality: 271 mosm/kg — ABNORMAL LOW (ref 275–295)

## 2023-06-14 LAB — MAGNESIUM: Magnesium: 1.9 mg/dL (ref 1.7–2.4)

## 2023-06-14 LAB — TROPONIN I (HIGH SENSITIVITY)
Troponin I (High Sensitivity): 7 ng/L (ref <18)
Troponin I (High Sensitivity): 9 ng/L (ref <18)

## 2023-06-14 LAB — TSH: TSH: 1.494 u[IU]/mL (ref 0.350–4.500)

## 2023-06-14 LAB — CORTISOL: Cortisol, Plasma: 11 ug/dL

## 2023-06-14 LAB — CREATININE, URINE, RANDOM: Creatinine, Urine: 20 mg/dL

## 2023-06-14 MED ORDER — LACTULOSE 10 GM/15ML PO SOLN
10.0000 g | Freq: Two times a day (BID) | ORAL | Status: DC
  Administered 2023-06-14 – 2023-06-16 (×5): 10 g via ORAL
  Filled 2023-06-14 (×5): qty 15

## 2023-06-14 MED ORDER — MIDODRINE HCL 5 MG PO TABS
5.0000 mg | ORAL_TABLET | Freq: Three times a day (TID) | ORAL | Status: DC
  Administered 2023-06-14 – 2023-06-15 (×4): 5 mg via ORAL
  Filled 2023-06-14 (×5): qty 1

## 2023-06-14 MED ORDER — FAMOTIDINE 20 MG PO TABS
20.0000 mg | ORAL_TABLET | Freq: Two times a day (BID) | ORAL | Status: DC
  Administered 2023-06-14 – 2023-06-16 (×5): 20 mg via ORAL
  Filled 2023-06-14 (×5): qty 1

## 2023-06-14 MED ORDER — ACETAMINOPHEN 650 MG RE SUPP: 650.0000 mg | Freq: Four times a day (QID) | RECTAL | Status: DC | PRN

## 2023-06-14 MED ORDER — LEVOTHYROXINE SODIUM 75 MCG PO TABS
75.0000 ug | ORAL_TABLET | Freq: Every day | ORAL | Status: DC
  Administered 2023-06-14 – 2023-06-16 (×3): 75 ug via ORAL
  Filled 2023-06-14 (×3): qty 1

## 2023-06-14 MED ORDER — MIDODRINE HCL 5 MG PO TABS
2.5000 mg | ORAL_TABLET | Freq: Three times a day (TID) | ORAL | Status: DC
  Administered 2023-06-14: 2.5 mg via ORAL
  Filled 2023-06-14: qty 1

## 2023-06-14 MED ORDER — POTASSIUM CHLORIDE CRYS ER 20 MEQ PO TBCR
40.0000 meq | EXTENDED_RELEASE_TABLET | ORAL | Status: AC
Start: 2023-06-14 — End: 2023-06-14
  Administered 2023-06-14 (×2): 40 meq via ORAL
  Filled 2023-06-14 (×2): qty 2

## 2023-06-14 MED ORDER — MELATONIN 5 MG PO TABS
5.0000 mg | ORAL_TABLET | Freq: Every day | ORAL | Status: DC
  Administered 2023-06-14 – 2023-06-15 (×2): 5 mg via ORAL
  Filled 2023-06-14 (×2): qty 1

## 2023-06-14 MED ORDER — PSYLLIUM 95 % PO PACK
1.0000 | PACK | Freq: Two times a day (BID) | ORAL | Status: DC
  Administered 2023-06-15 – 2023-06-16 (×2): 1 via ORAL
  Filled 2023-06-14 (×6): qty 1

## 2023-06-14 MED ORDER — GABAPENTIN 300 MG PO CAPS
300.0000 mg | ORAL_CAPSULE | ORAL | Status: DC
  Administered 2023-06-14 – 2023-06-16 (×10): 300 mg via ORAL
  Filled 2023-06-14 (×10): qty 1

## 2023-06-14 MED ORDER — BISACODYL 10 MG RE SUPP: 10.0000 mg | Freq: Every day | RECTAL | Status: DC | PRN

## 2023-06-14 MED ORDER — APIXABAN 5 MG PO TABS
5.0000 mg | ORAL_TABLET | Freq: Two times a day (BID) | ORAL | Status: DC
  Administered 2023-06-14 – 2023-06-16 (×5): 5 mg via ORAL
  Filled 2023-06-14 (×5): qty 1

## 2023-06-14 MED ORDER — OXYBUTYNIN CHLORIDE 5 MG PO TABS: 5.0000 mg | ORAL_TABLET | Freq: Two times a day (BID) | ORAL | Status: DC | PRN

## 2023-06-14 MED ORDER — ACETAMINOPHEN 325 MG PO TABS: 650.0000 mg | ORAL_TABLET | Freq: Four times a day (QID) | ORAL | Status: DC | PRN

## 2023-06-14 MED ORDER — PRUCALOPRIDE SUCCINATE 2 MG PO TABS: 2.0000 mg | ORAL_TABLET | Freq: Every day | ORAL | Status: DC

## 2023-06-14 MED ORDER — ONDANSETRON HCL 4 MG PO TABS: 4.0000 mg | ORAL_TABLET | Freq: Four times a day (QID) | ORAL | Status: DC | PRN

## 2023-06-14 MED ORDER — SENNA 8.6 MG PO TABS
17.2000 mg | ORAL_TABLET | Freq: Two times a day (BID) | ORAL | Status: DC
  Administered 2023-06-14 – 2023-06-16 (×4): 17.2 mg via ORAL
  Filled 2023-06-14 (×4): qty 2

## 2023-06-14 MED ORDER — ALPRAZOLAM 0.5 MG PO TABS
0.5000 mg | ORAL_TABLET | Freq: Every day | ORAL | Status: DC
  Administered 2023-06-14 – 2023-06-15 (×2): 0.5 mg via ORAL
  Filled 2023-06-14 (×3): qty 1

## 2023-06-14 MED ORDER — BACLOFEN 10 MG PO TABS: 20.0000 mg | ORAL_TABLET | Freq: Every day | ORAL | Status: DC | PRN

## 2023-06-14 MED ORDER — CIPROFLOXACIN HCL 500 MG PO TABS
500.0000 mg | ORAL_TABLET | Freq: Two times a day (BID) | ORAL | Status: AC
Start: 2023-06-14 — End: 2023-06-14
  Administered 2023-06-14 (×2): 500 mg via ORAL
  Filled 2023-06-14 (×2): qty 1

## 2023-06-14 MED ORDER — ONDANSETRON HCL 4 MG/2ML IJ SOLN: 4.0000 mg | Freq: Four times a day (QID) | INTRAMUSCULAR | Status: DC | PRN

## 2023-06-14 MED ORDER — METOPROLOL SUCCINATE ER 25 MG PO TB24
25.0000 mg | ORAL_TABLET | Freq: Every day | ORAL | Status: DC
  Administered 2023-06-16: 25 mg via ORAL
  Filled 2023-06-14: qty 1

## 2023-06-14 MED ORDER — SODIUM CHLORIDE 0.9 % IV BOLUS
1000.0000 mL | Freq: Once | INTRAVENOUS | Status: AC
  Administered 2023-06-14: 1000 mL via INTRAVENOUS

## 2023-06-14 NOTE — ED Provider Notes (Signed)
Care assumed from PA Rebekah Sponseller  at shift change pending hospitalist consult for admission. Please see her note for full details, but in brief Toni [name] is a 77 y.o. female is being admitted for hyponatremia. No altered mental status or seizure activity,m but has had some dizziness.  Case discussed with Dr. Loney Loh with [facility], who will see and admit the patient.   Dartha Lodge, PA-C 06/14/23 [id number]    Gilda Crease, MD 06/15/23 [phone]

## 2023-06-14 NOTE — Evaluation (Signed)
Occupational Therapy Evaluation Patient Details Name: Toni [name] MRN: [id number] DOB: August 13, 1946 Today's Date: 06/14/2023   History of Present Illness 77 yo female admitted 10/24 with hyponatremia, hypokalemia from [facility] ALF. PMHx: PAF, HFpEF, Multiple sclerosis, CA, cervical fusion, HTN, HLD, depression   Clinical Impression   Pt currently close to baseline functional level but feel she can make improvement with basic ADL independence and sitting balance.  Min assist level for bed mobility with use of the rails with overall min assist for sitting balance during grooming tasks of washing her face and brushing her hair.  Pt is from [name] and has assist with all ADLs and is transferred by staff to her wheelchair with use of a standing system similar to the [place] +.  Feel she will benefit from acute care OT to help build strength, endurance, and balance in order to decreased caregiver dependence at SNF.  Recommend continued OT post acute to maximize potential.          If plan is discharge home, recommend the following: Two people to help with walking and/or transfers;A lot of help with bathing/dressing/bathroom    Functional Status Assessment  Patient has had a recent decline in their functional status and demonstrates the ability to make significant improvements in function in a reasonable and predictable amount of time.  Equipment Recommendations  None recommended by OT       Precautions / Restrictions Precautions Precautions: Fall Restrictions Weight Bearing Restrictions: No      Mobility Bed Mobility Overal bed mobility: Needs Assistance Bed Mobility: Supine to Sit, Sit to Supine     Supine to sit: Min assist Sit to supine: Min assist   General bed mobility comments: min assist to move RLE off EOB with HOB 35 degrees. pt able to scoot to EOB with min assist of pad and increased time. Min assist to lift RLE to surface with return to supine and total +2 to  slide toward HOB. Pt with posterior bias sitting EOB 5 min with difficulty maintaining LLE on floor    Transfers                          Balance Overall balance assessment: Needs assistance   Sitting balance-Leahy Scale: Fair Sitting balance - Comments: Pt able to maintain static sitting with supervision and then exhibited LOB posteriorly with completion of grooming tasks.                                   ADL either performed or assessed with clinical judgement   ADL Overall ADL's : At baseline;Needs assistance/impaired     Grooming: Supervision/safety;With adaptive equipment                               Functional mobility during ADLs: Minimal assistance (supine to sit EOB) General ADL Comments: Pt reports having assist from hired caregiver as well as Animal nutritionist for bathing, dressing, and toileting tasks.  [place] + or similar system is used at the SNF to transfer her to her wheelchair, lift chair, bed, and bedside commode.  Per her spouse she has not been able to complete a transfer in around 4 yrs without the assist of the lift aide equipment.  She was able to transfer to the EOB with min assist with [facility]  elevate and use of rails for completion of grooming tasks.  Oxygen sats at 97% on room air with BP in sitting at 101/67.  She needed overall min assist for sitting balance with mod instructional cueing to correct posterior lean as she was completing grooming tasks.  Issued foam grips to use over utensils as pt reports having adaptive utensils she uses at [facility].     Vision Baseline Vision/History: 0 No visual deficits Ability to See in Adequate Light: 0 Adequate Patient Visual Report: No change from baseline Vision Assessment?: No apparent visual deficits     Perception Perception: Within Functional Limits       Praxis Praxis: WFL       Pertinent Vitals/Pain Pain Assessment Pain Assessment: No/denies pain      Extremity/Trunk Assessment Upper Extremity Assessment Upper Extremity Assessment:  (bilateral shoulder flexion 0-120 degrees approximately.  Elbow AROM WFLs.  Noted arthritic changes in hands and wrists with decreased FM coordination and strength premorbidly.)   Lower Extremity Assessment Lower Extremity Assessment: Defer to PT evaluation RLE Deficits / Details: decreased strength RLE grossly 2/5   Cervical / Trunk Assessment Cervical / Trunk Assessment: Kyphotic;Other exceptions Cervical / Trunk Exceptions: increased body habitus   Communication Communication Communication: No apparent difficulties   Cognition Arousal: Alert Behavior During Therapy: WFL for tasks assessed/performed Overall Cognitive Status: Within Functional Limits for tasks assessed                                                  Home Living Family/patient expects to be discharged to:: Assisted living                             Home Equipment: BSC/3in1;Adaptive equipment;Hospital bed;Wheelchair - Careers adviser (comment)   Additional Comments: [facility] x 2 years      Prior Functioning/Environment Prior Level of Function : Needs assist       Physical Assist : Mobility (physical);ADLs (physical) Mobility (physical): Bed mobility;Transfers ADLs (physical): Grooming;Bathing;Dressing;Toileting;IADLs Mobility Comments: uses mechanical lift to transfer bed>wheelchair, BSC, lift chair. able to sit up to EOB with some assist of RLE ADLs Comments: Aide assists with dressing, uses BSC for toileting        OT Problem List: Decreased strength;Impaired balance (sitting and/or standing);Decreased activity tolerance      OT Treatment/Interventions: Self-care/ADL training;Patient/family education;DME and/or AE instruction;Therapeutic activities;Balance training    OT Goals(Current goals can be found in the care plan section) Acute Rehab OT Goals Patient Stated Goal:  Pt did not state but agreeable to working in therapy OT Goal Formulation: With patient/family Time For Goal Achievement: 06/28/23 Potential to Achieve Goals: Good  OT Frequency: Min 1X/week    Co-evaluation PT/OT/SLP Co-Evaluation/Treatment: Yes   PT goals addressed during session: Mobility/safety with mobility OT goals addressed during session: ADL's and self-care      AM-PAC OT "6 Clicks" Daily Activity     Outcome Measure Help from another person eating meals?: A Little Help from another person taking care of personal grooming?: A Little Help from another person toileting, which includes using toliet, bedpan, or urinal?: Total Help from another person bathing (including washing, rinsing, drying)?: Total Help from another person to put on and taking off regular upper body clothing?: Total Help from another person to put on and taking  off regular lower body clothing?: Total 6 Click Score: 10   End of Session    Activity Tolerance: Patient tolerated treatment well Patient left: in bed;with call bell/phone within reach;with bed alarm set;with family/visitor present  OT Visit Diagnosis: Muscle weakness (generalized) (M62.81)                Time: 1914-7829 OT Time Calculation (min): 31 min Charges:  OT General Charges $OT Visit: 1 Visit OT Evaluation $OT Eval Moderate Complexity: 1 Mod  Perrin Maltese, OTR/L Acute Rehabilitation Services  Office [phone] 06/14/2023

## 2023-06-14 NOTE — Care Management Obs Status (Cosign Needed)
MEDICARE OBSERVATION STATUS NOTIFICATION   Patient Details  Name: Toni [name] MRN: [id number] Date of Birth: [birth year]   Medicare Observation Status Notification Given:  Yes    Janae Bridgeman, RN 06/14/2023, 4:01 PM

## 2023-06-14 NOTE — ED Notes (Signed)
Report received from Loren Racer RN. Assumed care of pt at this time.

## 2023-06-14 NOTE — ED Notes (Signed)
ED TO INPATIENT HANDOFF REPORT  ED Nurse Name and Phone #: [phone]  S Name/Age/Gender Toni [name] 77 y.o. female Room/Bed: 026C/026C  Code Status   Code Status: Full Code  Home/SNF/Other Nursing Home Patient oriented to: self, place, time, and situation Is this baseline? Yes   Triage Complete: Triage complete  Chief Complaint Hyponatremia [E87.1]  Triage Note Pt presents via EMS from [facility] c/o abnormal labs drawn yesterday. EMS reports k of 3.2 and Na of 124. Pt also reports she has been having dizziness. A&O x4.    Allergies Allergies  Allergen Reactions   Other Hives and Rash    Topical Surgical prep  - severe rash, hives   Hydrocodone Nausea And Vomiting   Oxycontin [Oxycodone] Nausea And Vomiting   Zoloft [Sertraline] Hives, Swelling and Rash   Chloraseptic [Phenol] Hives    Not documented on MAR   Macrobid [Nitrofurantoin] Nausea And Vomiting    Not documented on MAR   Vioxx [Rofecoxib] Other (See Comments)    Unknown reaction Not documented on MAR Can take NSAIDS   Hibiclens [Chlorhexidine Gluconate] Hives, Rash and Other (See Comments)    Sores    Level of Care/Admitting Diagnosis ED Disposition     ED Disposition  Admit   Condition  --   Comment  Hospital Area: [facility] [[id number]]  Level of Care: Telemetry Medical [104]  May place patient in observation at [facility] or [facility] if equivalent level of care is available:: Yes  Covid Evaluation: Asymptomatic - no recent exposure (last 10 days) testing not required  Diagnosis: Hyponatremia [198519]  Admitting Physician: Rolly Salter [[id number]]  Attending Physician: Rolly Salter [[id number]]          B Medical/Surgery History Past Medical History:  Diagnosis Date   Abnormality of gait 03/01/2015   Acute metabolic encephalopathy 06/04/2020   Anxiety    Arthritis    "knees" (01/19/2016)   Asthma    as a child    Atrial fibrillation (HCC)     Back pain    Bilateral carpal tunnel syndrome 10/04/2020   Cancer (HCC)    / RENAL CELL CARCINOMA - GETS CT SCAN EVERY 6 MONTHS TO WATCH    CHF (congestive heart failure) (HCC)    Chronic pain    "nerve pain from the MS"   Complex atypical endometrial hyperplasia    Constipation    COVID-19 virus infection 08/23/2019   Depression    DVT (deep venous thrombosis) (HCC) 1983   "RLE; may have just been phlebitis; it was before the age of dopplers"   Dysrhythmia    afib    Edema    varicose veins with severe venous insuff in R and L GSV' ablation of R GSV 2012   GERD (gastroesophageal reflux disease)    HLD (hyperlipidemia)    Hypertension    Hypotension    Hypothyroidism    Joint pain    Multiple sclerosis (HCC)    has had this may 1989-[facility] reg doc   OSA on CPAP    bipap machine - SLEEP APNEA WORSE SINCE COVID IN 1/21 PER PATIENT SETTING HAS INCREASED FROM 9 TO 30    Osteoarthritis    PAF (paroxysmal atrial fibrillation) (HCC)    on coumadin; documented on monitor 06/2010 - no longer takes per Renne Crigler 02/09/21   Pneumonia 11/2011   Sleep apnea    uses sleep apnea machine   Past Surgical History:  Procedure Laterality Date   ANTERIOR CERVICAL DECOMP/DISCECTOMY FUSION  01/2004   CARDIAC CATHETERIZATION  06/22/2010   normal coronary arteries, PAF   CATARACT EXTRACTION, BILATERAL Bilateral    CHILD BIRTHS     X2   DILATATION & CURETTAGE/HYSTEROSCOPY WITH MYOSURE N/A 01/15/2020   Procedure: DILATATION & CURETTAGE/HYSTEROSCOPY WITH MYOSURE;  Surgeon: Richardean Chimera, MD;  Location: [facility]S;  Service: Gynecology;  Laterality: N/A;  pt in wheelchair-has MS   DILATION AND CURETTAGE OF UTERUS     DILATION AND CURETTAGE OF UTERUS N/A 02/16/2020   Procedure: DILATATION AND CURETTAGE;  Surgeon: Carver Fila, MD;  Location: [facility];  Service: Gynecology;  Laterality: N/A;  NOT GENERAL -NO INTUBATION   DNC     INFUSION PUMP IMPLANTATION  ~ 2009   baclofen infusion in lower  abd   INTRATHECAL PUMP REVISION N/A 02/18/2021   Procedure: Baclofen Pump replacement with possible revision of catheter;  Surgeon: Maeola Harman, MD;  Location: [facility] OR;  Service: Neurosurgery;  Laterality: N/A;  3C/RM 20   INTRAUTERINE DEVICE (IUD) INSERTION N/A 02/16/2020   Procedure: INTRAUTERINE DEVICE (IUD) INSERTION - MIRENA;  Surgeon: Carver Fila, MD;  Location: [facility];  Service: Gynecology;  Laterality: N/A;   IUD put in uterus     PAIN PUMP REVISION N/A 07/29/2014   Procedure: Baclofen pump replacement;  Surgeon: Maeola Harman, MD;  Location: [facility];  Service: Neurosurgery;  Laterality: N/A;  Baclofen pump replacement   TONSILLECTOMY AND ADENOIDECTOMY     VARICOSE VEIN SURGERY  ~ 1968   VENOUS ABLATION  12/16/2010   radiofreq ablation -Dr Lynnea Ferrier and [facility]   WISDOM TOOTH EXTRACTION       A IV Location/Drains/Wounds Patient Lines/Drains/Airways Status     Active Line/Drains/Airways     Name Placement date Placement time Site Days   Peripheral IV 06/14/23 20 G 1" Right Forearm 06/14/23  0421  Forearm  less than 1   Urethral Catheter Straight-tip 18 Fr. 06/14/23  0400  Straight-tip  less than 1   Pressure Injury 11/20/22 Sacrum Left;Lower Stage 1 -  Intact skin with non-blanchable redness of a localized area usually over a bony prominence. 11/20/22  1845  -- 206   Pressure Injury 11/20/22 Buttocks Right;Lower Stage 2 -  Partial thickness loss of dermis presenting as a shallow open injury with a red, pink wound bed without slough. 11/20/22  1845  -- 206            Intake/Output Last 24 hours  Intake/Output Summary (Last 24 hours) at 06/14/2023 0932 Last data filed at 06/14/2023 0740 Gross per 24 hour  Intake 1000 ml  Output --  Net 1000 ml    Labs/Imaging Results for orders placed or performed during the hospital encounter of 06/14/23 (from the past 48 hour(s))  CBC with Differential     Status: Abnormal   Collection Time: 06/14/23  4:18 AM  Result  Value Ref Range   WBC 7.8 4.0 - 10.5 K/uL   RBC 4.13 3.87 - 5.11 MIL/uL   Hemoglobin 12.9 12.0 - 15.0 g/dL   HCT 62.9 52.8 - 41.3 %   MCV 88.1 80.0 - 100.0 fL   MCH 31.2 26.0 - 34.0 pg   MCHC 35.4 30.0 - 36.0 g/dL   RDW 24.4 (L) 01.0 - 27.2 %   Platelets 248 150 - 400 K/uL   nRBC 0.0 0.0 - 0.2 %   Neutrophils Relative % 42 %   Neutro Abs 3.3  1.7 - 7.7 K/uL   Lymphocytes Relative 41 %   Lymphs Abs 3.3 0.7 - 4.0 K/uL   Monocytes Relative 12 %   Monocytes Absolute 0.9 0.1 - 1.0 K/uL   Eosinophils Relative 4 %   Eosinophils Absolute 0.3 0.0 - 0.5 K/uL   Basophils Relative 1 %   Basophils Absolute 0.1 0.0 - 0.1 K/uL   Immature Granulocytes 0 %   Abs Immature Granulocytes 0.02 0.00 - 0.07 K/uL    Comment: Performed at [facility], [address] [place], [place] [address]  Comprehensive metabolic panel     Status: Abnormal   Collection Time: 06/14/23  4:18 AM  Result Value Ref Range   Sodium 123 (L) 135 - 145 mmol/L   Potassium 3.5 3.5 - 5.1 mmol/L   Chloride 79 (L) 98 - 111 mmol/L   CO2 33 (H) 22 - 32 mmol/L   Glucose, Bld 80 70 - 99 mg/dL    Comment: Glucose reference range applies only to samples taken after fasting for at least 8 hours.   BUN 23 8 - 23 mg/dL   Creatinine, Ser 6.04 0.44 - 1.00 mg/dL   Calcium 8.9 8.9 - 54.0 mg/dL   Total Protein 6.2 (L) 6.5 - 8.1 g/dL   Albumin 3.3 (L) 3.5 - 5.0 g/dL   AST 19 15 - 41 U/L   ALT 10 0 - 44 U/L   Alkaline Phosphatase 60 38 - 126 U/L   Total Bilirubin 0.6 0.3 - 1.2 mg/dL   GFR, Estimated >98 >11 mL/min    Comment: (NOTE) Calculated using the CKD-EPI Creatinine Equation (2021)    Anion gap 11 5 - 15    Comment: Performed at [facility], [address], [place], [place] [address]  Troponin I (High Sensitivity)     Status: None   Collection Time: 06/14/23  4:18 AM  Result Value Ref Range   Troponin I (High Sensitivity) 7 <18 ng/L    Comment: (NOTE) Elevated high sensitivity troponin I (hsTnI) values and  significant  changes across serial measurements may suggest ACS but many other  chronic and acute conditions are known to elevate hsTnI results.  Refer to the "Links" section for chest pain algorithms and additional  guidance. Performed at [facility], [address], [place], [place] [address]   Troponin I (High Sensitivity)     Status: None   Collection Time: 06/14/23  5:45 AM  Result Value Ref Range   Troponin I (High Sensitivity) 9 <18 ng/L    Comment: (NOTE) Elevated high sensitivity troponin I (hsTnI) values and significant  changes across serial measurements may suggest ACS but many other  chronic and acute conditions are known to elevate hsTnI results.  Refer to the "Links" section for chest pain algorithms and additional  guidance. Performed at [facility], [address] [place], [place] [address]   Urinalysis, Routine w reflex microscopic -Urine, Clean Catch     Status: Abnormal   Collection Time: 06/14/23  6:21 AM  Result Value Ref Range   Color, Urine YELLOW YELLOW   APPearance CLEAR CLEAR   Specific Gravity, Urine 1.005 1.005 - 1.030   pH 7.0 5.0 - 8.0   Glucose, UA NEGATIVE NEGATIVE mg/dL   Hgb urine dipstick SMALL (A) NEGATIVE   Bilirubin Urine NEGATIVE NEGATIVE   Ketones, ur NEGATIVE NEGATIVE mg/dL   Protein, ur NEGATIVE NEGATIVE mg/dL   Nitrite NEGATIVE NEGATIVE   Leukocytes,Ua LARGE (  A) NEGATIVE   RBC / HPF 0-5 0 - 5 RBC/hpf   WBC, UA 21-50 0 - 5 WBC/hpf   Bacteria, UA MANY (A) NONE SEEN   Squamous Epithelial / HPF 0-5 0 - 5 /HPF   Mucus PRESENT     Comment: Performed at [facility], [address], [place], [place] [address]  Basic metabolic panel     Status: Abnormal   Collection Time: 06/14/23  8:20 AM  Result Value Ref Range   Sodium 125 (L) 135 - 145 mmol/L   Potassium 3.3 (L) 3.5 - 5.1 mmol/L   Chloride 82 (L) 98 - 111 mmol/L   CO2 31 22 - 32 mmol/L   Glucose, Bld 83 70 - 99 mg/dL    Comment: Glucose reference range  applies only to samples taken after fasting for at least 8 hours.   BUN 21 8 - 23 mg/dL   Creatinine, Ser 2.95 0.44 - 1.00 mg/dL   Calcium 8.4 (L) 8.9 - 10.3 mg/dL   GFR, Estimated >62 >13 mL/min    Comment: (NOTE) Calculated using the CKD-EPI Creatinine Equation (2021)    Anion gap 12 5 - 15    Comment: Performed at [facility], [address] [place], [place] [address]  TSH     Status: None   Collection Time: 06/14/23  8:20 AM  Result Value Ref Range   TSH 1.494 0.350 - 4.500 uIU/mL    Comment: Performed by a 3rd Generation assay with a functional sensitivity of <=0.01 uIU/mL. Performed at [facility], [address] [place], [place] [address]   T4, free     Status: Abnormal   Collection Time: 06/14/23  8:20 AM  Result Value Ref Range   Free T4 2.18 (H) 0.61 - 1.12 ng/dL    Comment: (NOTE) Biotin ingestion may interfere with free T4 tests. If the results are inconsistent with the TSH level, previous test results, or the clinical presentation, then consider biotin interference. If needed, order repeat testing after stopping biotin. Performed at [facility], [address] [place], [place] [address]   Cortisol     Status: None   Collection Time: 06/14/23  8:20 AM  Result Value Ref Range   Cortisol, Plasma 11.0 ug/dL    Comment: (NOTE) AM    6.7 - 22.6 ug/dL PM   <41.3       ug/dL Performed at [facility], [address] [place], [place] [address]   Osmolality     Status: Abnormal   Collection Time: 06/14/23  8:20 AM  Result Value Ref Range   Osmolality 271 (L) 275 - 295 mOsm/kg    Comment: Performed at [facility], [address] [place], [place] [address]  Na and K (sodium & potassium), rand urine     Status: None   Collection Time: 06/14/23  8:20 AM  Result Value Ref Range   Sodium, Ur <10 mmol/L   Potassium Urine 27 mmol/L    Comment: Performed at [facility], [address] [place], [place] [address]  Creatinine,  urine, random     Status: None   Collection Time: 06/14/23  8:20 AM  Result Value Ref Range   Creatinine, Urine 20 mg/dL    Comment: Performed at [facility], [address] [place], [place] [address]   *Note: Due to a large number of results and/or encounters for the requested time period,  some results have not been displayed. A complete set of results can be found in Results Review.   No results found.  Pending Labs Unresulted Labs (From admission, onward)     Start     Ordered   06/14/23 0753  Osmolality, urine  Add-on,   AD        06/14/23 0752            Vitals/Pain Today's Vitals   06/14/23 0833 06/14/23 0834 06/14/23 0835 06/14/23 0836  BP:      Pulse: 65 67 61 75  Resp: 15 19 18 18   Temp:      TempSrc:      SpO2: 100% 100% 100% 100%  Weight:      Height:      PainSc:        Isolation Precautions No active isolations  Medications Medications  ALPRAZolam (XANAX) tablet 0.5 mg (has no administration in time range)  apixaban (ELIQUIS) tablet 5 mg (has no administration in time range)  ciprofloxacin (CIPRO) tablet 500 mg (has no administration in time range)  famotidine (PEPCID) tablet 20 mg (has no administration in time range)  gabapentin (NEURONTIN) capsule 300 mg (has no administration in time range)  lactulose (CHRONULAC) 10 GM/15ML solution 10 g (has no administration in time range)  levothyroxine (SYNTHROID) tablet 75 mcg (has no administration in time range)  melatonin tablet 5 mg (has no administration in time range)  metoprolol succinate (TOPROL-XL) 24 hr tablet 25 mg (has no administration in time range)  midodrine (PROAMATINE) tablet 2.5 mg (has no administration in time range)  oxybutynin (DITROPAN) tablet 5 mg (has no administration in time range)  Prucalopride Succinate TABS 2 mg (has no administration in time range)  psyllium (HYDROCIL/METAMUCIL) 1 packet (has no administration in time range)  senna (SENOKOT) tablet 17.2 mg (has no  administration in time range)  acetaminophen (TYLENOL) tablet 650 mg (has no administration in time range)    Or  acetaminophen (TYLENOL) suppository 650 mg (has no administration in time range)  bisacodyl (DULCOLAX) suppository 10 mg (has no administration in time range)  ondansetron (ZOFRAN) tablet 4 mg (has no administration in time range)    Or  ondansetron (ZOFRAN) injection 4 mg (has no administration in time range)  sodium chloride 0.9 % bolus 1,000 mL (0 mLs Intravenous Stopped 06/14/23 0740)    Mobility non-ambulatory; Mobility is with a w/c.     Focused Assessments   R Recommendations: See Admitting Provider Note  Report given to:   Additional Notes: Awake, alert, oriented x4. GCS 15. Chronic foley. Soft pressures, great MAP. Reports she transfers by stand lift at facility and uses w/c for mobility. Swallows pills whole.

## 2023-06-14 NOTE — [place] FL2 (Addendum)
[place] MEDICAID FL2 LEVEL OF CARE FORM     IDENTIFICATION  Patient Name: Toni [name] Birthdate: 06-08-1946 Sex: female Admission Date (Current Location): 06/14/2023  [facility] and [place] Number:  Producer, television/film/video and Address:  [facility], 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 02/21/2023   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/25/2022   Wheelchair dependent 12/25/2022   Pressure injury of skin 11/21/2022   Sepsis secondary to UTI (HCC) 11/20/2022   (HFpEF) heart failure with preserved ejection fraction (HCC) 07/14/2022   Hyponatremia 07/13/2022   Cushing's syndrome, ACTH dependent (HCC) 09/12/2021   Presence of intrathecal pump 08/18/2021   Spasticity 02/18/2021   Bladder spasms 01/04/2021   UTI (urinary tract infection) 01/03/2021   Bilateral carpal tunnel syndrome 10/04/2020   Multiple sclerosis, 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HCC) 09/13/2020   Chronic intermittent hypoxia with obstructive sleep apnea 09/13/2020   COVID-19 long hauler manifesting chronic dyspnea 09/13/2020   Chronic diastolic CHF (congestive heart failure) (HCC) 09/13/2020   Acute respiratory failure with hypoxia (HCC) 09/13/2020   Class 2 severe obesity with serious comorbidity and body mass index (BMI) of 35.0 to 35.9 in adult (HCC) 09/13/2020   Sepsis (HCC) 06/04/2020   Atrial fibrillation with RVR (HCC) 04/07/2020   Recurrent  UTI 04/07/2020   Uncomplicated degenerative myopia of both eyes 03/19/2020   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/2020   Oxygen dependent 02/26/2020   Neurogenic hypoventilation 02/26/2020   Complex atypical endometrial hyperplasia    Acute on chronic respiratory failure with hypoxia (HCC) 10/10/2019   Multiple sclerosis (HCC) 10/10/2019   Bilateral leg weakness 08/04/2019   Weakness 08/04/2019   Unilateral primary osteoarthritis, left knee 01/09/2019   Obstructive sleep apnea treated with bilevel positive airway pressure (BiPAP) 08/15/2018   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) 11/21/2017   Permanent atrial fibrillation (HCC) 11/21/2017   Tightness of leg fascia 11/21/2017   Muscle spasm of both lower legs 11/21/2017   Carpal tunnel syndrome, left upper limb 09/13/2017   Carpal tunnel syndrome, right upper limb 09/13/2017   Other insomnia 06/11/2017   Depression with anxiety 06/11/2017   Vitamin D deficiency 03/22/2017   Right hand weakness 08/06/2016   Nonischemic cardiomyopathy (HCC) 05/09/2016   Localized edema 05/09/2016   Long term current use of anticoagulant therapy 02/03/2016   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   Obesity hypoventilation syndrome (HCC) 03/30/2015   Abnormality of gait 03/01/2015   Chronic constipation with overflow incontinence 01/25/2015   ACTH dependent Cushing's syndrome (HCC) 01/25/2015   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) 01/25/2015   Neurogenic urinary bladder disorder 08/24/2014   Varicose veins of both lower extremities with complications 06/04/2013   Pseudophakia of both eyes 02/07/2012   Multiple sclerosis, 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 11/26/2011   Hypothyroidism    HLD (hyperlipidemia)     Orientation RESPIRATION BLADDER Height & Weight  Self, Time, Situation, Place  O2 (2L Nasal Cannula) Incontinent Weight: 197 lb (89.4  kg) Height:  5\' 3"  (160 cm)  BEHAVIORAL SYMPTOMS/MOOD NEUROLOGICAL BOWEL NUTRITION STATUS      Continent Diet (Please see dc summary)  AMBULATORY STATUS COMMUNICATION OF NEEDS Skin   Extensive Assist Verbally Normal                       Personal Care Assistance Level of Assistance  Bathing, Feeding, Dressing Bathing Assistance: Maximum assistance Feeding assistance: Maximum assistance Dressing Assistance: Maximum assistance     Functional Limitations Info  Sight Sight Info: Impaired (R and L)        SPECIAL CARE FACTORS FREQUENCY  PT (By licensed PT), OT (By licensed OT)     PT Frequency: 5x OT Frequency: 5x            Contractures Contractures Info: Not present    Additional Factors Info  Code Status, Allergies Code Status Info: Full Code Allergies Info: Topical Surgical Prep; Hydrocodone; Oxycontin (Oxycodone); Zoloft (Sertraline); Cloralseptic (Phenol); Macrobid (Nitrofurantoin); Vioxx (Rofecoxib); Hibiclens (Chlorhexidine Gluconate)           Current Medications (06/14/2023):  This is the current hospital active medication list Current Facility-Administered Medications  Medication Dose Route Frequency Provider Last Rate Last Admin   acetaminophen (TYLENOL) tablet 650 mg  650 mg Oral Q6H PRN Rolly Salter, MD       Or   acetaminophen (TYLENOL) suppository 650 mg  650 mg Rectal Q6H PRN Rolly Salter, MD       ALPRAZolam Prudy Feeler) tablet 0.5 mg  0.5 mg Oral QHS Rolly Salter, MD       apixaban Everlene Balls) tablet 5 mg  5 mg Oral BID Rolly Salter, MD   5 mg at 06/14/23 1120   bisacodyl (DULCOLAX) suppository 10 mg  10 mg Rectal Daily PRN Rolly Salter, MD       ciprofloxacin (CIPRO) tablet 500 mg  500 mg Oral BID Rolly Salter, MD   500 mg at 06/14/23 1353   famotidine (PEPCID) tablet 20 mg  20 mg Oral BID Rolly Salter, MD   20 mg at 06/14/23 1119   gabapentin (NEURONTIN) capsule 300 mg  300 mg Oral Q4H while awake Rolly Salter, MD   300 mg at  06/14/23 1353   lactulose (CHRONULAC) 10 GM/15ML solution 10 g  10 g Oral BID Rolly Salter, MD   10 g at 06/14/23 1120   levothyroxine (SYNTHROID) tablet 75 mcg  75 mcg Oral QAC breakfast Rolly Salter, MD   75 mcg at 06/14/23 1003   melatonin tablet 5 mg  5 mg Oral QHS Rolly Salter, MD       [START ON 06/15/2023] metoprolol succinate (TOPROL-XL) 24 hr tablet 25 mg  25 mg Oral Daily Rolly Salter, MD       midodrine (PROAMATINE) tablet 2.5 mg  2.5 mg Oral TID WC Rolly Salter, MD   2.5 mg at 06/14/23 1120   ondansetron (ZOFRAN) tablet 4 mg  4 mg Oral Q6H PRN Rolly Salter, MD       Or   ondansetron [facility]) injection 4 mg  4 mg Intravenous Q6H PRN Rolly Salter, MD       oxybutynin (DITROPAN) tablet 5 mg  5 mg Oral BID PRN Rolly Salter, MD       psyllium (HYDROCIL/METAMUCIL) 1 packet  1  packet Oral BID Rolly Salter, MD       senna [facility]) tablet 17.2 mg  17.2 mg Oral BID Rolly Salter, MD   17.2 mg at 06/14/23 1120     Discharge Medications: Please see discharge summary for a list of discharge medications.  Relevant Imaging Results:  Relevant Lab Results:   Additional Information SS# [id number]  Marliss Coots, LCSW

## 2023-06-14 NOTE — ED Provider Notes (Signed)
[place] EMERGENCY DEPARTMENT AT [facility] Provider Note   CSN: [id number] Arrival date & time: 06/14/23  0340     History  Chief Complaint  Patient presents with   Abnormal labs    Toni [name] is a 77 y.o. female who [facility] from [facility] for evaluation after reported hyponatremia and hypokalemia on labs yesterday.  States she has been having episodes of lightheadedness when she changes position but otherwise has been at her baseline.  States that tonight she was feeling well and is unsure why the facility sent her here tonight.   I reviewed her medical record.  She is history of MS, persistent A-fib anticoagulated on Eliquis.  Chronic indwelling Foley, hypertension, CHF.  Wheelchair dependent.Husband, Alycia Rossetti, at the bedside.   HPI     Home Medications Prior to Admission medications   Medication Sig Start Date End Date Taking? Authorizing Provider  acetaminophen (TYLENOL) 500 MG tablet Take 1,000 mg by mouth every 8 (eight) hours as needed for fever or headache (pain).   Yes [provider]  ALPRAZolam (XANAX) 0.5 MG tablet Take 1 tablet (0.5 mg total) by mouth at bedtime. May also take 1 tablet every 12 hours as needed anxiety Patient taking differently: Take 0.5 mg by mouth at bedtime. 07/17/22  Yes Ghimire, Werner Lean, MD  apixaban (ELIQUIS) 5 MG TABS tablet Take 5 mg by mouth 2 (two) times daily.   Yes [provider]  baclofen (LIORESAL) 20 MG tablet Take 20 mg by mouth daily as needed for muscle spasms.   Yes [provider]  Biotin 40981 MCG TABS Take 10,000 mcg by mouth daily after lunch.   Yes [provider]  bisacodyl (DULCOLAX) 10 MG suppository Place 10 mg rectally See admin instructions. Insert 1 suppository rectally  once a day on Sun, Tue, Thurs   Yes [provider]  buPROPion (WELLBUTRIN XL) 150 MG 24 hr tablet Take 1 tablet (150 mg total) by mouth daily. 10/18/20  Yes Dohmeier,  Porfirio Mylar, MD  Calcium Carb-Cholecalciferol (CALCIUM 500/D) 500-10 MG-MCG CHEW Chew 1 tablet by mouth 2 (two) times daily.   Yes [provider]  cetirizine (ZYRTEC) 10 MG tablet Take 10 mg by mouth daily as needed for allergies.   Yes [provider]  cholecalciferol 25 MCG (1000 UT) tablet Take 2,000 Units by mouth 2 (two) times daily with breakfast and lunch.   Yes [provider]  ciprofloxacin (CIPRO) 500 MG tablet Take 500 mg by mouth 2 (two) times daily. 06/08/23  Yes [provider]  corticotropin (ATHACAR H.P.) 80 UNIT/ML injectable gel Give subcutaneous 80 units every other day for 10 days ( 5 shots ) a month. 02/21/23  Yes Dohmeier, Porfirio Mylar, MD  diphenhydrAMINE (BENADRYL) 25 mg capsule Take 25 mg by mouth 3 (three) times daily as needed (minor itching, irritation).   Yes [provider]  docusate sodium (COLACE) 100 MG capsule Take 100 mg by mouth 2 (two) times daily.   Yes [provider]  Emollient (CERAVE DAILY MOISTURIZING) LOTN Apply 1 application  topically daily. To groin, inguinal area   Yes [provider]  escitalopram (LEXAPRO) 10 MG tablet Take 1 tablet (10 mg total) by mouth daily. Patient taking differently: Take 10 mg by mouth at bedtime. 10/18/20  Yes Dohmeier, Porfirio Mylar, MD  famotidine (PEPCID) 20 MG tablet Take 20 mg by mouth 2 (two) times daily.   Yes [provider]  fluticasone (FLONASE) 50 MCG/ACT nasal  spray Place 1 spray into both nostrils daily as needed (for nasal congestion).   Yes [provider]  gabapentin (NEURONTIN) 300 MG capsule Mrs. Hoppel will take 300 mg of gabapentin at 6 AM, 10 AM, 2 PM and 6 PM and 600 mg at 10 PM by mouth. Patient taking differently: Take 300 mg by mouth See admin instructions. 300 mg 5 times daily at 0600, 1000, 1400, 1800 and 300-600 mg at bedtime. 03/07/21  Yes Dohmeier, Porfirio Mylar, MD  ibuprofen (ADVIL) 400 MG tablet Take 400 mg by mouth 3 (three) times daily.   Yes  [provider]  lactulose (CHRONULAC) 10 GM/15ML solution Take 15 mLs (10 g total) by mouth 2 (two) times daily as needed for mild constipation. Patient taking differently: Take 10 g by mouth 2 (two) times daily. 07/17/22  Yes Ghimire, Werner Lean, MD  levothyroxine (SYNTHROID) 75 MCG tablet Take 75 mcg by mouth daily before breakfast.   Yes [provider]  melatonin 5 MG TABS Take 5 mg by mouth at bedtime.   Yes [provider]  metolazone (ZAROXOLYN) 2.5 MG tablet Take 2.5 mg by mouth daily as needed (weight gain > 3 lbs in 2 days with swelling.).   Yes [provider]  metoprolol succinate (TOPROL-XL) 25 MG 24 hr tablet Take 1 tablet (25 mg total) by mouth daily. 11/25/22 06/13/24 Yes Zigmund Daniel., MD  midodrine (PROAMATINE) 2.5 MG tablet Take 2 tablets (5 mg total) by mouth 3 (three) times daily as needed (hypotension (systolic blood pressure less than 100)). 11/24/22  Yes Zigmund Daniel., MD  Multiple Vitamins-Minerals (CEROVITE SENIOR) TABS Take 1 tablet by mouth daily.   Yes [provider]  NONFORMULARY OR COMPOUNDED ITEM Apply 1 Application topically See admin instructions. 1:1 clotrimazole 1% and hydrocortisone 1%. Apply twice daily to perineal region and abdominal folds.   Yes [provider]  ondansetron (ZOFRAN-ODT) 4 MG disintegrating tablet Take 4 mg by mouth 4 (four) times daily as needed for nausea.   Yes [provider]  oxybutynin (DITROPAN) 5 MG tablet Take 5 mg by mouth 2 (two) times daily as needed (for overactive bladder).   Yes [provider]  phenazopyridine (PYRIDIUM) 100 MG tablet Take 100 mg by mouth every 8 (eight) hours as needed (dysuria).   Yes [provider]  Polyethyl Glycol-Propyl Glycol (SYSTANE) 0.4-0.3 % SOLN Place 1 drop into both eyes in the morning and at bedtime.   Yes [provider]  polyethylene glycol (MIRALAX / GLYCOLAX) 17 g packet Take 17 g by mouth  daily.   Yes [provider]  potassium chloride SA (KLOR-CON) 20 MEQ tablet Take 2 tablets (40 mEq total) by mouth daily. Patient taking differently: Take 40 mEq by mouth 2 (two) times daily. 01/03/21  Yes Hilty, Lisette Abu, MD  Prucalopride Succinate (MOTEGRITY) 2 MG TABS Take 2 mg by mouth daily.   Yes [provider]  Psyllium (METAMUCIL PO) Take 1 packet by mouth 2 (two) times daily. Metamucil 2.4g/5.4g powder packets.   Yes [provider]  Salicylic Acid-Cleanser 6 % CREAM KIT Apply 1 each topically See admin instructions. Apply topically on Mon, Wed, Sat to lesion on foot   Yes [provider]  senna (SENOKOT) 8.6 MG TABS tablet Take 17.2 mg by mouth 2 (two) times daily.   Yes [provider]  sodium phosphate (FLEET) 7-19 GM/118ML ENEM Place 1 enema rectally daily as needed (constipation).   Yes [provider]  torsemide (DEMADEX) 20 MG tablet Take 1.5 tablets (30 mg total) by mouth 2 (two) times daily. 01/30/23  Yes Wittenborn, Gavin Pound, NP  corticotropin (ACTHAR) 80 UNIT/ML injectable gel Inject 1 mL (80 Units total) into the skin See admin instructions for 10 days. Inject 80 units as needed (about 5 times a month) 02/21/23 06/13/24  Dohmeier, Porfirio Mylar, MD      Allergies    Other, Hydrocodone, Oxycontin [oxycodone], Zoloft [sertraline], Chloraseptic [phenol], Macrobid [nitrofurantoin], Vioxx [rofecoxib], and Hibiclens [chlorhexidine gluconate]    Review of Systems   Review of Systems  Neurological:  Positive for light-headedness.    Physical Exam Updated Vital Signs BP (!) 99/54   Pulse (!) 57   Temp (!) 97.5 F (36.4 C) (Oral)   Resp 15   Ht 5\' 3"  (1.6 m)   Wt 89.4 kg   SpO2 97%   BMI 34.90 kg/m  Physical Exam Vitals and nursing note reviewed.  Constitutional:      Appearance: She is obese. She is not ill-appearing or toxic-appearing.  HENT:     Head: Normocephalic and atraumatic.     Mouth/Throat:     Mouth: Mucous  membranes are moist.     Pharynx: No oropharyngeal exudate or posterior oropharyngeal erythema.  Eyes:     General:        Right eye: No discharge.        Left eye: No discharge.     Conjunctiva/sclera: Conjunctivae normal.  Cardiovascular:     Rate and Rhythm: Normal rate. Rhythm irregularly irregular.     Pulses: Normal pulses.  Pulmonary:     Effort: Pulmonary effort is normal. No respiratory distress.     Breath sounds: Normal breath sounds. No wheezing or rales.  Abdominal:     General: Bowel sounds are normal. There is no distension.     Palpations: Abdomen is soft.     Tenderness: There is no abdominal tenderness. There is no guarding or rebound.  Genitourinary:    Comments: Foley Musculoskeletal:        General: No deformity.     Cervical back: Neck supple.     Right lower leg: 1+ Edema present.     Left lower leg: 1+ Edema present.  Skin:    General: Skin is warm and dry.  Neurological:     Mental Status: She is alert and oriented to person, place, and time. Mental status is at baseline.     GCS: GCS eye subscore is 4. GCS verbal subscore is 5. GCS motor subscore is 6.     Cranial Nerves: Cranial nerves 2-12 are intact.     Sensory: Sensory deficit present.     Motor: Motor function is intact.     Comments: Decreased sensation in the right compared to left upper extremities though patient states this is at her baseline in context of her MS. Patient wheelchair bound at baseline. Neuro exam at patient's baseline per patient and husband at the bedside.   Psychiatric:        Mood and Affect: Mood normal.     ED Results / Procedures / Treatments   Labs (all labs ordered are listed, but only abnormal results are displayed) Labs Reviewed  CBC WITH DIFFERENTIAL/PLATELET - Abnormal; Notable for the following components:      Result Value   RDW 11.4 (*)    All other components within normal limits  COMPREHENSIVE METABOLIC PANEL - Abnormal; Notable for the following  components:  Sodium 123 (*)    Chloride 79 (*)    CO2 33 (*)    Total Protein 6.2 (*)    Albumin 3.3 (*)    All other components within normal limits  URINALYSIS, ROUTINE W REFLEX MICROSCOPIC - Abnormal; Notable for the following components:   Hgb urine dipstick SMALL (*)    Leukocytes,Ua LARGE (*)    Bacteria, UA MANY (*)    All other components within normal limits  TROPONIN I (HIGH SENSITIVITY)  TROPONIN I (HIGH SENSITIVITY)    EKG EKG Interpretation Date/Time:  Thursday June 14 2023 04:05:13 EDT Ventricular Rate:  77 PR Interval:    QRS Duration:  98 QT Interval:  426 QTC Calculation: 482 R Axis:   75  Text Interpretation: Atrial fibrillation with a competing junctional pacemaker Abnormal ECG When compared with ECG of 14-Jun-2023 04:03, No acute changes Confirmed by Gilda Crease [phone]) on 06/14/2023 4:17:12 AM  Radiology No results found.  Procedures Procedures    Medications Ordered in ED Medications  sodium chloride 0.9 % bolus 1,000 mL (1,000 mLs Intravenous New Bag/Given 06/14/23 2585)    ED Course/ Medical Decision Making/ A&P                                 Medical Decision Making 77 y/o female who presents with concern for abnormal labs.   VS reassuring on intake, cardiac exam with irregularly irregular rhythm consistent with patient's known permanent A-fib.  Pulmonary TMs unremarkable, neuroexam at patient's baseline.  Abdominal exam is benign.  Amount and/or Complexity of Data Reviewed Labs: ordered.    Details: CBC unremarkable, CMP with hyponatremia of 123, UA with scant hemoglobin large leukocytes and many bacteria though this is a catheterized specimen with chronic indwelling Foley.   ECG/medicine tests:     Details: EKG with A-fib, noted patient.  Risk Decision regarding hospitalization.   Patient require admission to the hospital for increased severity of hyponatremia. Hospitalist consult pending at shift change, AM ED team  briefed on patient HPI and dispo planning. Hx of CHF will require titrated correction.   Aurbree voiced understanding of her medical evaluation and treatment plan. Each of their questions answered to their expressed satisfaction. Amenable to plan for admission at this time.   This chart was dictated using voice recognition software, Dragon. Despite the best efforts of this provider to proofread and correct errors, errors may still occur which can change documentation meaning.         Final Clinical Impression(s) / ED Diagnoses Final diagnoses:  Hyponatremia    Rx / DC Orders ED Discharge Orders     None         Paris Lore, PA-C 06/14/23 2778    Gilda Crease, MD 06/15/23 [phone]

## 2023-06-14 NOTE — [place] FL2 (Signed)
[place] MEDICAID FL2 LEVEL OF CARE FORM     IDENTIFICATION  Patient Name: Toni [name] Birthdate: [birth year] Sex: female Admission Date (Current Location): 06/14/2023  [facility] and [place] Number:  Producer, television/film/video and Address:  [place]. [facility], [address] [place], [place] [address]      Provider Number: [id number]  Attending Physician Name and Address:  Rolly Salter, MD  Relative Name and Phone Number:  Jeanette Caprice; Daughter; [phone]    Current Level of Care: Hospital Recommended Level of Care: Assisted Living Facility 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 02/21/2023   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/25/2022   Wheelchair dependent 12/25/2022   Pressure injury of skin 11/21/2022   Sepsis secondary to UTI (HCC) 11/20/2022   (HFpEF) heart failure with preserved ejection fraction (HCC) 07/14/2022   Hyponatremia 07/13/2022   Cushing's syndrome, ACTH dependent (HCC) 09/12/2021   Presence of intrathecal pump 08/18/2021   Spasticity 02/18/2021   Bladder spasms 01/04/2021   UTI (urinary tract infection) 01/03/2021   Bilateral carpal tunnel syndrome 10/04/2020   Multiple sclerosis, 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HCC) 09/13/2020   Chronic intermittent hypoxia with obstructive sleep apnea 09/13/2020   COVID-19 long hauler manifesting chronic dyspnea 09/13/2020   Chronic diastolic CHF (congestive heart failure) (HCC) 09/13/2020   Acute respiratory failure with hypoxia (HCC) 09/13/2020   Class 2 severe obesity with serious comorbidity and body mass index (BMI) of 35.0 to 35.9 in adult (HCC) 09/13/2020   Sepsis (HCC) 06/04/2020   Atrial fibrillation with RVR (HCC)  04/07/2020   Recurrent UTI 04/07/2020   Uncomplicated degenerative myopia of both eyes 03/19/2020   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/2020   Oxygen dependent 02/26/2020   Neurogenic hypoventilation 02/26/2020   Complex atypical endometrial hyperplasia    Acute on chronic respiratory failure with hypoxia (HCC) 10/10/2019   Multiple sclerosis (HCC) 10/10/2019   Bilateral leg weakness 08/04/2019   Weakness 08/04/2019   Unilateral primary osteoarthritis, left knee 01/09/2019   Obstructive sleep apnea treated with bilevel positive airway pressure (BiPAP) 08/15/2018   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) 11/21/2017   Permanent atrial fibrillation (HCC) 11/21/2017   Tightness of leg fascia 11/21/2017   Muscle spasm of both lower legs 11/21/2017   Carpal tunnel syndrome, left upper limb 09/13/2017   Carpal tunnel syndrome, right upper limb 09/13/2017   Other insomnia 06/11/2017   Depression with anxiety 06/11/2017   Vitamin D deficiency 03/22/2017   Right hand weakness 08/06/2016   Nonischemic cardiomyopathy (HCC) 05/09/2016   Localized edema 05/09/2016   Long term current use of anticoagulant therapy 02/03/2016   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   Obesity hypoventilation syndrome (HCC) 03/30/2015   Abnormality of gait 03/01/2015   Chronic constipation with overflow incontinence 01/25/2015   ACTH dependent Cushing's syndrome (HCC) 01/25/2015   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) 01/25/2015   Neurogenic urinary bladder disorder 08/24/2014   Varicose veins of both lower extremities with complications 06/04/2013   Pseudophakia of both eyes 02/07/2012   Multiple sclerosis, 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 11/26/2011   Hypothyroidism    HLD (hyperlipidemia)     Orientation RESPIRATION BLADDER Height & Weight  Self, Time, Situation, Place  O2 (2L Nasal Cannula); BiPaP (from  home) Incontinent Weight: 197 lb (89.4 kg) Height:  5\' 3"  (160 cm)  BEHAVIORAL SYMPTOMS/MOOD NEUROLOGICAL BOWEL NUTRITION STATUS      Continent Diet (Please see dc summary)  AMBULATORY STATUS COMMUNICATION OF NEEDS Skin   Extensive Assist Verbally Normal                       Personal Care Assistance Level of Assistance  Bathing, Feeding, Dressing Bathing Assistance: Maximum assistance Feeding assistance: Maximum assistance Dressing Assistance: Maximum assistance     Functional Limitations Info  Sight Sight Info: Impaired (R and L)        SPECIAL CARE FACTORS FREQUENCY  PT (By licensed PT), OT (By licensed OT)     PT Frequency: 5x OT Frequency: 5x            Contractures Contractures Info: Not present    Additional Factors Info  Code Status, Allergies Code Status Info: Full Code Allergies Info: Topical Surgical Prep; Hydrocodone; Oxycontin (Oxycodone); Zoloft (Sertraline); Cloralseptic (Phenol); Macrobid (Nitrofurantoin); Vioxx (Rofecoxib); Hibiclens (Chlorhexidine Gluconate)           Current Medications (06/14/2023):  This is the current hospital active medication list Current Facility-Administered Medications  Medication Dose Route Frequency Provider Last Rate Last Admin   acetaminophen (TYLENOL) tablet 650 mg  650 mg Oral Q6H PRN Rolly Salter, MD       Or   acetaminophen (TYLENOL) suppository 650 mg  650 mg Rectal Q6H PRN Rolly Salter, MD       ALPRAZolam Prudy Feeler) tablet 0.5 mg  0.5 mg Oral QHS Rolly Salter, MD       apixaban Everlene Balls) tablet 5 mg  5 mg Oral BID Rolly Salter, MD   5 mg at 06/14/23 1120   bisacodyl (DULCOLAX) suppository 10 mg  10 mg Rectal Daily PRN Rolly Salter, MD       ciprofloxacin (CIPRO) tablet 500 mg  500 mg Oral BID Rolly Salter, MD   500 mg at 06/14/23 1353   famotidine (PEPCID) tablet 20 mg  20 mg Oral BID Rolly Salter, MD   20 mg at 06/14/23 1119   gabapentin (NEURONTIN) capsule 300 mg  300 mg Oral Q4H  while awake Rolly Salter, MD   300 mg at 06/14/23 1353   lactulose (CHRONULAC) 10 GM/15ML solution 10 g  10 g Oral BID Rolly Salter, MD   10 g at 06/14/23 1120   levothyroxine (SYNTHROID) tablet 75 mcg  75 mcg Oral QAC breakfast Rolly Salter, MD   75 mcg at 06/14/23 1003   melatonin tablet 5 mg  5 mg Oral QHS Rolly Salter, MD       [START ON 06/15/2023] metoprolol succinate (TOPROL-XL) 24 hr tablet 25 mg  25 mg Oral Daily Rolly Salter, MD       midodrine (PROAMATINE) tablet 2.5 mg  2.5 mg Oral TID WC Rolly Salter, MD   2.5 mg at 06/14/23 1120   ondansetron (ZOFRAN) tablet 4 mg  4 mg Oral Q6H PRN Rolly Salter, MD       Or   ondansetron [facility]) injection 4 mg  4 mg Intravenous Q6H PRN Rolly Salter, MD       oxybutynin (DITROPAN) tablet 5 mg  5 mg Oral BID PRN Rolly Salter, MD       psyllium (HYDROCIL/METAMUCIL) 1  packet  1 packet Oral BID Rolly Salter, MD       senna [facility]) tablet 17.2 mg  17.2 mg Oral BID Rolly Salter, MD   17.2 mg at 06/14/23 1120     Discharge Medications: Please see discharge summary for a list of discharge medications.  Relevant Imaging Results:  Relevant Lab Results:   Additional Information SS# [id number]  Marliss Coots, LCSW

## 2023-06-14 NOTE — Evaluation (Signed)
Physical Therapy Evaluation Patient Details Name: Toni [name] MRN: [id number] DOB: 18-Jan-1946 Today's Date: 06/14/2023  History of Present Illness  77 yo female admitted 10/24 with hyponatremia, hypokalemia from [facility]. PMHx: PAF, HFpEF, Multiple sclerosis, CA, cervical fusion, HTN, HLD, depression  Clinical Impression  Pt very pleasant and reports being unable to transfers OOB without lift equipment since Dec 2022. Pt normally can transfer with limited assist to EOB and back. Pt reports decreasing core strength needing min-CGA EOB this session and pt would like to improve sitting tolerance. Pt with decreased activity tolerance who will benefit from limited acute therapy to progress activity acutely. Recommend chair position for meals and OOB via lift.         If plan is discharge home, recommend the following: Two people to help with walking and/or transfers;A lot of help with bathing/dressing/bathroom   Can travel by private vehicle        Equipment Recommendations None recommended by PT  Recommendations for Other Services       Functional Status Assessment Patient has had a recent decline in their functional status and/or demonstrates limited ability to make significant improvements in function in a reasonable and predictable amount of time     Precautions / Restrictions Precautions Precautions: Fall Restrictions Weight Bearing Restrictions: No      Mobility  Bed Mobility Overal bed mobility: Needs Assistance Bed Mobility: Supine to Sit, Sit to Supine     Supine to sit: Min assist Sit to supine: Min assist   General bed mobility comments: min assist to move RLE off EOB with HOB 35 degrees. pt able to scoot to EOB with min assist of pad and increased time. Min assist to lift RLE to surface with return to supine and total +2 to slide toward HOB. Pt with posterior bias sitting EOB 5 min with difficulty maintaining LLE on floor    Transfers                    General transfer comment: did not attempt as requires lift at baseline    Ambulation/Gait                  Stairs            Wheelchair Mobility     Tilt Bed    Modified Rankin (Stroke Patients Only)       Balance Overall balance assessment: Needs assistance   Sitting balance-Leahy Scale: Fair Sitting balance - Comments: initial min assist with progression to CGA, EOB 5 min with VC to correct to midline                                     Pertinent Vitals/Pain Pain Assessment Pain Assessment: No/denies pain    Home Living Family/patient expects to be discharged to:: Assisted living                 Home Equipment: BSC/3in1;Adaptive equipment;Hospital bed;Wheelchair - Careers adviser (comment) Additional Comments: [facility] x 2 years    Prior Function Prior Level of Function : Needs assist       Physical Assist : Mobility (physical);ADLs (physical) Mobility (physical): Bed mobility;Transfers ADLs (physical): Grooming;Bathing;Dressing;Toileting;IADLs Mobility Comments: uses mechanical lift to transfer bed>wheelchair, BSC, lift chair. able to sit up to EOB with some assist of RLE ADLs Comments: Aide assists with dressing, uses BSC for toileting  Extremity/Trunk Assessment   Upper Extremity Assessment Upper Extremity Assessment: Defer to OT evaluation    Lower Extremity Assessment Lower Extremity Assessment: Generalized weakness;RLE deficits/detail RLE Deficits / Details: decreased strength RLE grossly 2/5    Cervical / Trunk Assessment Cervical / Trunk Assessment: Kyphotic;Other exceptions Cervical / Trunk Exceptions: increased body habitus  Communication   Communication Communication: No apparent difficulties  Cognition Arousal: Alert Behavior During Therapy: WFL for tasks assessed/performed Overall Cognitive Status: Within Functional Limits for tasks assessed                                           General Comments      Exercises     Assessment/Plan    PT Assessment Patient needs continued PT services  PT Problem List Decreased activity tolerance;Decreased balance;Decreased mobility       PT Treatment Interventions Functional mobility training;Therapeutic activities;Therapeutic exercise;Balance training    PT Goals (Current goals can be found in the Care Plan section)  Acute Rehab PT Goals Patient Stated Goal: be able to sit and do my hair PT Goal Formulation: With patient Time For Goal Achievement: 06/28/23 Potential to Achieve Goals: Fair    Frequency Min 1X/week     Co-evaluation PT/OT/SLP Co-Evaluation/Treatment: Yes   PT goals addressed during session: Mobility/safety with mobility         AM-PAC PT "6 Clicks" Mobility  Outcome Measure Help needed turning from your back to your side while in a flat bed without using bedrails?: A Little Help needed moving from lying on your back to sitting on the side of a flat bed without using bedrails?: A Little Help needed moving to and from a bed to a chair (including a wheelchair)?: Total Help needed standing up from a chair using your arms (e.g., wheelchair or bedside chair)?: Total Help needed to walk in hospital room?: Total Help needed climbing 3-5 steps with a railing? : Total 6 Click Score: 10    End of Session   Activity Tolerance: Patient tolerated treatment well Patient left: in bed;with call bell/phone within reach;with family/visitor present Nurse Communication: Mobility status;Need for lift equipment PT Visit Diagnosis: Other abnormalities of gait and mobility (R26.89);Muscle weakness (generalized) (M62.81)    Time: 1093-2355 PT Time Calculation (min) (ACUTE ONLY): 23 min   Charges:   PT Evaluation $PT Eval Low Complexity: 1 Low   PT General Charges $$ ACUTE PT VISIT: 1 Visit         Merryl Hacker, PT Acute Rehabilitation Services Office: [phone]   Enedina Finner  Lanise Mergen 06/14/2023, 12:30 PM

## 2023-06-14 NOTE — Progress Notes (Signed)
   06/14/23 2219  BiPAP/CPAP/SIPAP  $ Non-Invasive Home Ventilator  Initial  BiPAP/CPAP/SIPAP Pt Type Adult  BiPAP/CPAP/SIPAP Resmed  FiO2 (%) 40 %  Patient Home Equipment Yes  Auto Titrate Yes

## 2023-06-14 NOTE — Plan of Care (Signed)

## 2023-06-14 NOTE — H&P (Addendum)
History and Physical  Patient: Toni [name] MVH:[id number] DOB: [birth year] DOA: 06/14/2023 DOS: the patient was seen and examined on 06/14/2023 Patient coming from: SNF [facility]  Chief Complaint: Abnormal lab and dizziness ED TRIAGE note : "Pt presents via EMS from [facility] c/o abnormal labs drawn yesterday. EMS reports k of 3.2 and Na of 124. Pt also reports she has been having dizziness. A&O x4.  " HPI: Toni [name] is a 77 y.o. female with PMH significant of MS, chronic spasticity past baclofen pump, mood disorder, chronic Foley catheter due to neurogenic bladder, chronic A-fib, chronic HFpEF, GERD, hypothyroidism,. The patient presents to the hospital with complaints of dizziness. For last few days the patient has been having some dizziness when changing her position.  She had a routine follow-up from her NP couple of days ago at which time she reported that complaints of dizziness and labs were ordered for further evaluation. Patient was found to have a low sodium and low potassium and therefore was referred to the hospital for further workup and treatment. At the time of my evaluation continues to have some dizziness. Patient was also started on ciprofloxacin on 10/19 for possible UTI. She reports that she has chronic Foley catheter and the catheter was changed a few days before initiation of the antibiotics. She denies any abdominal pain no nausea no vomiting no diarrhea. She denies any recent change in the medications other than antibiotic. No tingling or numbness no other focal deficit reported by the patient as well. At her baseline she is wheelchair-bound. Recently seen in the neurology clinic for baclofen pump refill as well. She is compliant with all her medication. Unfortunately her insurance has not approved her corticotropin injection in last few months and therefore she has not taken that medication in last 6 to 7 months.  Review of Systems: As  mentioned in the history of present illness. All other systems reviewed and are negative.  Prior to Admission medications   Medication Sig Start Date End Date Taking? Authorizing Provider  acetaminophen (TYLENOL) 500 MG tablet Take 1,000 mg by mouth every 8 (eight) hours as needed for fever or headache (pain).   Yes [provider]  ALPRAZolam (XANAX) 0.5 MG tablet Take 1 tablet (0.5 mg total) by mouth at bedtime. May also take 1 tablet every 12 hours as needed anxiety Patient taking differently: Take 0.5 mg by mouth at bedtime. 07/17/22  Yes Ghimire, Werner Lean, MD  apixaban (ELIQUIS) 5 MG TABS tablet Take 5 mg by mouth 2 (two) times daily.   Yes [provider]  baclofen (LIORESAL) 20 MG tablet Take 20 mg by mouth daily as needed for muscle spasms.   Yes [provider]  Biotin 84132 MCG TABS Take 10,000 mcg by mouth daily after lunch.   Yes [provider]  bisacodyl (DULCOLAX) 10 MG suppository Place 10 mg rectally See admin instructions. Insert 1 suppository rectally  once a day on Sun, Tue, Thurs   Yes [provider]  buPROPion (WELLBUTRIN XL) 150 MG 24 hr tablet Take 1 tablet (150 mg total) by mouth daily. 10/18/20  Yes Dohmeier, Porfirio Mylar, MD  Calcium Carb-Cholecalciferol (CALCIUM 500/D) 500-10 MG-MCG CHEW Chew 1 tablet by mouth 2 (two) times daily.   Yes [provider]  cetirizine (ZYRTEC) 10 MG tablet Take 10 mg by mouth daily as needed for allergies.   Yes [provider]  cholecalciferol 25 MCG (1000 UT) tablet Take 2,000 Units by mouth 2 (  two) times daily with breakfast and lunch.   Yes [provider]  ciprofloxacin (CIPRO) 500 MG tablet Take 500 mg by mouth 2 (two) times daily. 06/08/23  Yes [provider]  corticotropin (ATHACAR H.P.) 80 UNIT/ML injectable gel Give subcutaneous 80 units every other day for 10 days ( 5 shots ) a month. 02/21/23  Yes Dohmeier, Porfirio Mylar, MD  diphenhydrAMINE (BENADRYL) 25 mg  capsule Take 25 mg by mouth 3 (three) times daily as needed (minor itching, irritation).   Yes [provider]  docusate sodium (COLACE) 100 MG capsule Take 100 mg by mouth 2 (two) times daily.   Yes [provider]  Emollient (CERAVE DAILY MOISTURIZING) LOTN Apply 1 application  topically daily. To groin, inguinal area   Yes [provider]  escitalopram (LEXAPRO) 10 MG tablet Take 1 tablet (10 mg total) by mouth daily. Patient taking differently: Take 10 mg by mouth at bedtime. 10/18/20  Yes Dohmeier, Porfirio Mylar, MD  famotidine (PEPCID) 20 MG tablet Take 20 mg by mouth 2 (two) times daily.   Yes [provider]  fluticasone (FLONASE) 50 MCG/ACT nasal spray Place 1 spray into both nostrils daily as needed (for nasal congestion).   Yes [provider]  gabapentin (NEURONTIN) 300 MG capsule Mrs. Hussey will take 300 mg of gabapentin at 6 AM, 10 AM, 2 PM and 6 PM and 600 mg at 10 PM by mouth. Patient taking differently: Take 300 mg by mouth See admin instructions. 300 mg 5 times daily at 0600, 1000, 1400, 1800 and 300-600 mg at bedtime. 03/07/21  Yes Dohmeier, Porfirio Mylar, MD  ibuprofen (ADVIL) 400 MG tablet Take 400 mg by mouth 3 (three) times daily.   Yes [provider]  lactulose (CHRONULAC) 10 GM/15ML solution Take 15 mLs (10 g total) by mouth 2 (two) times daily as needed for mild constipation. Patient taking differently: Take 10 g by mouth 2 (two) times daily. 07/17/22  Yes Ghimire, Werner Lean, MD  levothyroxine (SYNTHROID) 75 MCG tablet Take 75 mcg by mouth daily before breakfast.   Yes [provider]  melatonin 5 MG TABS Take 5 mg by mouth at bedtime.   Yes [provider]  metolazone (ZAROXOLYN) 2.5 MG tablet Take 2.5 mg by mouth daily as needed (weight gain > 3 lbs in 2 days with swelling.).   Yes [provider]  metoprolol succinate (TOPROL-XL) 25 MG 24 hr tablet Take 1 tablet (25 mg total) by mouth daily. 11/25/22 06/13/24  Yes Zigmund Daniel., MD  midodrine (PROAMATINE) 2.5 MG tablet Take 2 tablets (5 mg total) by mouth 3 (three) times daily as needed (hypotension (systolic blood pressure less than 100)). 11/24/22  Yes Zigmund Daniel., MD  Multiple Vitamins-Minerals (CEROVITE SENIOR) TABS Take 1 tablet by mouth daily.   Yes [provider]  NONFORMULARY OR COMPOUNDED ITEM Apply 1 Application topically See admin instructions. 1:1 clotrimazole 1% and hydrocortisone 1%. Apply twice daily to perineal region and abdominal folds.   Yes [provider]  ondansetron (ZOFRAN-ODT) 4 MG disintegrating tablet Take 4 mg by mouth 4 (four) times daily as needed for nausea.   Yes [provider]  oxybutynin (DITROPAN) 5 MG tablet Take 5 mg by mouth 2 (two) times daily as needed (for overactive bladder).   Yes [provider]  phenazopyridine (PYRIDIUM) 100 MG tablet Take 100 mg by mouth every 8 (eight) hours as needed (dysuria).   Yes [provider]  Polyethyl Glycol-Propyl Glycol (  SYSTANE) 0.4-0.3 % SOLN Place 1 drop into both eyes in the morning and at bedtime.   Yes [provider]  polyethylene glycol (MIRALAX / GLYCOLAX) 17 g packet Take 17 g by mouth daily.   Yes [provider]  potassium chloride SA (KLOR-CON) 20 MEQ tablet Take 2 tablets (40 mEq total) by mouth daily. Patient taking differently: Take 40 mEq by mouth 2 (two) times daily. 01/03/21  Yes Hilty, Lisette Abu, MD  Prucalopride Succinate (MOTEGRITY) 2 MG TABS Take 2 mg by mouth daily.   Yes [provider]  Psyllium (METAMUCIL PO) Take 1 packet by mouth 2 (two) times daily. Metamucil 2.4g/5.4g powder packets.   Yes [provider]  Salicylic Acid-Cleanser 6 % CREAM KIT Apply 1 each topically See admin instructions. Apply topically on Mon, Wed, Sat to lesion on foot   Yes [provider]  senna (SENOKOT) 8.6 MG TABS tablet Take 17.2 mg by mouth 2 (two) times daily.   Yes  [provider]  sodium phosphate (FLEET) 7-19 GM/118ML ENEM Place 1 enema rectally daily as needed (constipation).   Yes [provider]  torsemide (DEMADEX) 20 MG tablet Take 1.5 tablets (30 mg total) by mouth 2 (two) times daily. 01/30/23  Yes Wittenborn, Gavin Pound, NP  corticotropin (ACTHAR) 80 UNIT/ML injectable gel Inject 1 mL (80 Units total) into the skin See admin instructions for 10 days. Inject 80 units as needed (about 5 times a month) 02/21/23 06/13/24  Dohmeier, Porfirio Mylar, MD    Past Medical History:  Diagnosis Date   Abnormality of gait 03/01/2015   Acute metabolic encephalopathy 06/04/2020   Anxiety    Arthritis    "knees" (01/19/2016)   Asthma    as a child    Atrial fibrillation (HCC)    Back pain    Bilateral carpal tunnel syndrome 10/04/2020   Cancer (HCC)    / RENAL CELL CARCINOMA - GETS CT SCAN EVERY 6 MONTHS TO WATCH    CHF (congestive heart failure) (HCC)    Chronic pain    "nerve pain from the MS"   Complex atypical endometrial hyperplasia    Constipation    COVID-19 virus infection 08/23/2019   Depression    DVT (deep venous thrombosis) (HCC) 1983   "RLE; may have just been phlebitis; it was before the age of dopplers"   Dysrhythmia    afib    Edema    varicose veins with severe venous insuff in R and L GSV' ablation of R GSV 2012   GERD (gastroesophageal reflux disease)    HLD (hyperlipidemia)    Hypertension    Hypotension    Hypothyroidism    Joint pain    Multiple sclerosis (HCC)    has had this may 1989-Dohmeir reg doc   OSA on CPAP    bipap machine - SLEEP APNEA WORSE SINCE COVID IN 1/21 PER PATIENT SETTING HAS INCREASED FROM 9 TO 30    Osteoarthritis    PAF (paroxysmal atrial fibrillation) (HCC)    on coumadin; documented on monitor 06/2010 - no longer takes per [facility] 02/09/21   Pneumonia 11/2011   Sleep apnea    uses sleep apnea machine   Past Surgical History:  Procedure Laterality Date   ANTERIOR CERVICAL  DECOMP/DISCECTOMY FUSION  01/2004   CARDIAC CATHETERIZATION  06/22/2010   normal coronary arteries, PAF   CATARACT EXTRACTION, BILATERAL Bilateral    CHILD BIRTHS     X2   DILATATION & CURETTAGE/HYSTEROSCOPY WITH  MYOSURE N/A 01/15/2020   Procedure: DILATATION & CURETTAGE/HYSTEROSCOPY WITH MYOSURE;  Surgeon: Richardean Chimera, MD;  Location: [facility];  Service: Gynecology;  Laterality: N/A;  pt in wheelchair-has MS   DILATION AND CURETTAGE OF UTERUS     DILATION AND CURETTAGE OF UTERUS N/A 02/16/2020   Procedure: DILATATION AND CURETTAGE;  Surgeon: Carver Fila, MD;  Location: [facility];  Service: Gynecology;  Laterality: N/A;  NOT GENERAL -NO INTUBATION   DNC     INFUSION PUMP IMPLANTATION  ~ 2009   baclofen infusion in lower abd   INTRATHECAL PUMP REVISION N/A 02/18/2021   Procedure: Baclofen Pump replacement with possible revision of catheter;  Surgeon: Maeola Harman, MD;  Location: [facility] OR;  Service: Neurosurgery;  Laterality: N/A;  3C/RM 20   INTRAUTERINE DEVICE (IUD) INSERTION N/A 02/16/2020   Procedure: INTRAUTERINE DEVICE (IUD) INSERTION - MIRENA;  Surgeon: Carver Fila, MD;  Location: [facility];  Service: Gynecology;  Laterality: N/A;   IUD put in uterus     PAIN PUMP REVISION N/A 07/29/2014   Procedure: Baclofen pump replacement;  Surgeon: Maeola Harman, MD;  Location: [facility];  Service: Neurosurgery;  Laterality: N/A;  Baclofen pump replacement   TONSILLECTOMY AND ADENOIDECTOMY     VARICOSE VEIN SURGERY  ~ 1968   VENOUS ABLATION  12/16/2010   radiofreq ablation -Dr Lynnea Ferrier and Hilty   WISDOM TOOTH EXTRACTION     Social History:  reports that she quit smoking about 46 years ago. Her smoking use included cigarettes. She started smoking about 53 years ago. She has a 3.5 pack-year smoking history. She has never used smokeless tobacco. She reports that she does not currently use alcohol. She reports that she does not use drugs. Allergies  Allergen Reactions   Other Hives and  Rash    Topical Surgical prep  - severe rash, hives   Hydrocodone Nausea And Vomiting   Oxycontin [Oxycodone] Nausea And Vomiting   Zoloft [Sertraline] Hives, Swelling and Rash   Chloraseptic [Phenol] Hives    Not documented on MAR   Macrobid [Nitrofurantoin] Nausea And Vomiting    Not documented on MAR   Vioxx [Rofecoxib] Other (See Comments)    Unknown reaction Not documented on MAR Can take NSAIDS   Hibiclens [Chlorhexidine Gluconate] Hives, Rash and Other (See Comments)    Sores   Family History  Problem Relation Age of Onset   Depression Mother    Sudden death Mother    Anxiety disorder Mother    Coronary artery disease Father        at age 44   Hyperlipidemia Father    Thyroid disease Father    Coronary artery disease Maternal Grandmother    Depression Maternal Grandmother    Cancer Paternal Grandmother        breast   Alcoholism Son    Sleep apnea Neg Hx    Multiple sclerosis Neg Hx    Physical Exam: Vitals:   06/14/23 1000 06/14/23 1001 06/14/23 1002 06/14/23 1044  BP: (!) 95/52   (!) 104/55  Pulse: 71 79 70 68  Resp: 14 18 18 18   Temp:    97.7 F (36.5 C)  TempSrc:    Oral  SpO2: 100% 100% 100% 99%  Weight:      Height:       General: Appear in mild distress; no visible Abnormal Neck Mass Or lumps, Conjunctiva normal, oral mucosa dry Cardiovascular: S1 and S2 Present, no Murmur, Respiratory: good respiratory effort, Bilateral  Air entry present and CTA, no Crackles, no wheezes Abdomen: Bowel Sound present, Non tender  Extremities: trace chronic appearing pedal edema Neurology: alert and oriented to time, place, and person, chronic paraplegia Gait not checked due to patient safety concerns   Data Reviewed: I have reviewed ED notes, Vitals, Lab results and outpatient records. Since last encounter, pertinent lab results CBC and BMP   . I have ordered test including CBC and BMP  .   Assessment and Plan Hyponatremia Possibly hypovolemic Osmolality is  low. Urine sodium is low. Urine osmolality currently pending. Clinically does not appear hypervolemic. On torsemide which I will hold. Already received IV normal saline bolus in the ED with improvement in sodium level. For now we will provide regular diet and free water restriction and monitor BMP every 4 hours.  If trends lower will initiate IV fluid again. Holding her Wellbutrin and Lexapro out of concern from hyponatremia.  UTI (urinary tract infection) Was started on ciprofloxacin for total of 7 days. Will complete the course for 5 days  Dizziness. Likely stemming from hypotension. 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 Also has intermittent use of midodrine. Was switched from Cardizem to metoprolol for rate control few months ago.  Less likely that is the etiology. No focal deficit at the time of my evaluation. No indication for neuro imaging at present. would hold metoprolol and change midodrine to scheduled 2.5 mg 3 times daily. PT OT consulted.  Permanent atrial fibrillation (HCC) (HFpEF) heart failure with preserved ejection fraction (HCC) On metoprolol and Eliquis. Was on Cardizem in the past for rate control. Currently holding metoprolol due to hypotension.  Holding torsemide due to euvolemic status with dizziness. Continue anticoagulation.  Hypothyroidism TSH normal.  Free T4 mildly elevated. For now we will continue to home dose of Synthroid 75 mcg.  Neurogenic urinary bladder disorder Continue Foley catheter, present on admission.  Chronic constipation with overflow incontinence Continue home regimen.  ACTH dependent Cushing's syndrome (HCC) Follows up with neurology. Unable to get her ACTH injection due to insurance denial for now. Cortisol level is reassuring.  Depression with anxiety Holding Lexapro and Wellbutrin.  Multiple sclerosis  (HCC) Chronic spasticity. On baclofen as needed currently on hold due to dizziness. Has baclofen pump which continues. On gabapentin 300 mg 4 times a day and 600 mg nightly. Currently continues in her milligram 5 times a day only.  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 Chronic hypoxic respiratory failure. Continue BiPAP nightly.  Has 5 L oxygen along with BiPAP as well. Already has some nasal bridge skin breakdown.  Hypokalemia Potassium level low. Will replace aggressively.  Bladder spasms On oxybutynin as needed.  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 Consults: None Family Communication: Husband at bedside  Author: Lynden Oxford, MD 06/14/2023 11:29 AM For on call review [email or web address].

## 2023-06-14 NOTE — ED Triage Notes (Signed)
Pt presents via EMS from [facility] c/o abnormal labs drawn yesterday. EMS reports k of 3.2 and Na of 124. Pt also reports she has been having dizziness. A&O x4.

## 2023-06-14 NOTE — Progress Notes (Signed)
Transition of Care [facility]) - Inpatient Brief Assessment   Patient Details  Name: Toni [name] MRN: [id number] Date of Birth: [birth year]  [facility] CM/SW Contact:    Janae Bridgeman, RN Phone Number: 06/14/2023, 4:23 PM   Clinical Narrative: CM met with the patient at the bedside along with caregiver, Cathlean Cower at the bedside (Patient's private caregiver).  Patient was asleep and unable to provide history at this time.  Medicare Observation letter was provided at the bedside.  Patient has been a SNF resident at [facility] facility for past 2 years per the caregiver at the bedside.  DME at the facility includes hospital bed, 3:1, WC, Bipap machine, and oxygen.  FL2 was completed by [employer], MSW.  MSW will continue to follow the patient for [facility] needs and patient should return for [facility] for care when she is medically stable for discharge.  Transition of Care Asessment: Insurance and Status: (P) Insurance coverage has been reviewed Patient has primary care physician: (P) Yes Home environment has been reviewed: (P) From [facility] SNF facility for past 2 years Prior level of function:: (P) 24 hour care at [facility] Determinants of Health Reivew: (P) SDOH reviewed needs interventions Readmission risk has been reviewed: (P) Yes Transition of care needs: (P) transition of care needs identified, TOC will continue to follow

## 2023-06-15 DIAGNOSIS — E785 Hyperlipidemia, unspecified: Secondary | ICD-10-CM | POA: Diagnosis present

## 2023-06-15 DIAGNOSIS — Z87891 Personal history of nicotine dependence: Secondary | ICD-10-CM | POA: Diagnosis not present

## 2023-06-15 DIAGNOSIS — N319 Neuromuscular dysfunction of bladder, unspecified: Secondary | ICD-10-CM | POA: Diagnosis present

## 2023-06-15 DIAGNOSIS — Z993 Dependence on wheelchair: Secondary | ICD-10-CM | POA: Diagnosis not present

## 2023-06-15 DIAGNOSIS — F419 Anxiety disorder, unspecified: Secondary | ICD-10-CM | POA: Diagnosis present

## 2023-06-15 DIAGNOSIS — I9589 Other hypotension: Secondary | ICD-10-CM | POA: Diagnosis present

## 2023-06-15 DIAGNOSIS — N390 Urinary tract infection, site not specified: Secondary | ICD-10-CM | POA: Diagnosis present

## 2023-06-15 DIAGNOSIS — E039 Hypothyroidism, unspecified: Secondary | ICD-10-CM | POA: Diagnosis present

## 2023-06-15 DIAGNOSIS — I5032 Chronic diastolic (congestive) heart failure: Secondary | ICD-10-CM | POA: Diagnosis present

## 2023-06-15 DIAGNOSIS — E249 Cushing's syndrome, unspecified: Secondary | ICD-10-CM | POA: Diagnosis present

## 2023-06-15 DIAGNOSIS — E876 Hypokalemia: Secondary | ICD-10-CM | POA: Diagnosis present

## 2023-06-15 DIAGNOSIS — F39 Unspecified mood [affective] disorder: Secondary | ICD-10-CM | POA: Diagnosis present

## 2023-06-15 DIAGNOSIS — J9611 Chronic respiratory failure with hypoxia: Secondary | ICD-10-CM | POA: Diagnosis present

## 2023-06-15 DIAGNOSIS — K5909 Other constipation: Secondary | ICD-10-CM | POA: Diagnosis present

## 2023-06-15 DIAGNOSIS — Z8616 Personal history of COVID-19: Secondary | ICD-10-CM | POA: Diagnosis not present

## 2023-06-15 DIAGNOSIS — G35 Multiple sclerosis: Secondary | ICD-10-CM | POA: Diagnosis present

## 2023-06-15 DIAGNOSIS — I4821 Permanent atrial fibrillation: Secondary | ICD-10-CM | POA: Diagnosis present

## 2023-06-15 DIAGNOSIS — F32A Depression, unspecified: Secondary | ICD-10-CM | POA: Diagnosis present

## 2023-06-15 DIAGNOSIS — E871 Hypo-osmolality and hyponatremia: Secondary | ICD-10-CM | POA: Diagnosis present

## 2023-06-15 DIAGNOSIS — G8250 Quadriplegia, unspecified: Secondary | ICD-10-CM | POA: Diagnosis present

## 2023-06-15 DIAGNOSIS — I110 Hypertensive heart disease with heart failure: Secondary | ICD-10-CM | POA: Diagnosis present

## 2023-06-15 DIAGNOSIS — Z85528 Personal history of other malignant neoplasm of kidney: Secondary | ICD-10-CM | POA: Diagnosis not present

## 2023-06-15 DIAGNOSIS — Z79899 Other long term (current) drug therapy: Secondary | ICD-10-CM | POA: Diagnosis not present

## 2023-06-15 DIAGNOSIS — Z79890 Hormone replacement therapy (postmenopausal): Secondary | ICD-10-CM | POA: Diagnosis not present

## 2023-06-15 LAB — BASIC METABOLIC PANEL
Anion gap: 8 (ref 5–15)
Anion gap: 10 (ref 5–15)
Anion gap: 8 (ref 5–15)
BUN: 16 mg/dL (ref 8–23)
BUN: 13 mg/dL (ref 8–23)
BUN: 13 mg/dL (ref 8–23)
CO2: 31 mmol/L (ref 22–32)
CO2: 32 mmol/L (ref 22–32)
CO2: 34 mmol/L — ABNORMAL HIGH (ref 22–32)
Calcium: 8.6 mg/dL — ABNORMAL LOW (ref 8.9–10.3)
Calcium: 8.7 mg/dL — ABNORMAL LOW (ref 8.9–10.3)
Calcium: 8.6 mg/dL — ABNORMAL LOW (ref 8.9–10.3)
Chloride: 90 mmol/L — ABNORMAL LOW (ref 98–111)
Chloride: 90 mmol/L — ABNORMAL LOW (ref 98–111)
Chloride: 89 mmol/L — ABNORMAL LOW (ref 98–111)
Creatinine, Ser: 0.66 mg/dL (ref 0.44–1.00)
Creatinine, Ser: 0.64 mg/dL (ref 0.44–1.00)
Creatinine, Ser: 0.56 mg/dL (ref 0.44–1.00)
GFR, Estimated: >60 mL/min (ref >60)
GFR, Estimated: >60 mL/min (ref >60)
GFR, Estimated: >60 mL/min (ref >60)
Glucose, Bld: 81 mg/dL (ref 70–99)
Glucose, Bld: 116 mg/dL — ABNORMAL HIGH (ref 70–99)
Glucose, Bld: 134 mg/dL — ABNORMAL HIGH (ref 70–99)
Potassium: 4.2 mmol/L (ref 3.5–5.1)
Potassium: 3.8 mmol/L (ref 3.5–5.1)
Potassium: 4.4 mmol/L (ref 3.5–5.1)
Sodium: 129 mmol/L — ABNORMAL LOW (ref 135–145)
Sodium: 132 mmol/L — ABNORMAL LOW (ref 135–145)
Sodium: 131 mmol/L — ABNORMAL LOW (ref 135–145)

## 2023-06-15 LAB — CBC
HCT: 35.7 % — ABNORMAL LOW (ref 36.0–46.0)
Hemoglobin: 12.2 g/dL (ref 12.0–15.0)
MCH: 30.8 pg (ref 26.0–34.0)
MCHC: 34.2 g/dL (ref 30.0–36.0)
MCV: 90.2 fL (ref 80.0–100.0)
Platelets: 248 10*3/uL (ref 150–400)
RBC: 3.96 MIL/uL (ref 3.87–5.11)
RDW: 11.8 % (ref 11.5–15.5)
WBC: 7 10*3/uL (ref 4.0–10.5)
nRBC: 0 % (ref 0.0–0.2)

## 2023-06-15 LAB — MAGNESIUM: Magnesium: 2.2 mg/dL (ref 1.7–2.4)

## 2023-06-15 MED ORDER — BACITRACIN-NEOMYCIN-POLYMYXIN OINTMENT TUBE
TOPICAL_OINTMENT | Freq: Two times a day (BID) | CUTANEOUS | Status: DC
  Filled 2023-06-15: qty 14

## 2023-06-15 MED ORDER — GERHARDT'S BUTT CREAM
TOPICAL_CREAM | Freq: Three times a day (TID) | CUTANEOUS | Status: DC
  Filled 2023-06-15: qty 1

## 2023-06-15 NOTE — Consult Note (Signed)
WOC Nurse Consult Note: patient from SNF with Stage 2 nose from Bipap and L plantar foot callus ? Diabetic ulcer she says podiatry sees at [facility]  Reason for Consult: nose, sacrum, L plantar foot wound  Wound type: 1.  Stage 2 bridge of nose 2.  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  Sacrum with Stage 2 Pressure Injury  Moisture associated damage noted to coccyx, sacrum and buttocks  ICD-10 CM Codes for Irritant Dermatitis L24A2 - Due to fecal, urinary or dual incontinence Pressure Injury POA: Yes Measurement: 1.  Stage 2 bridge of nose 2 cm x 1 cm 100% pink moist  2.  L plantar foot area of heavy callus, no open wound at this time 1 cm x 1 cm total area, callus with dark tissue apparent underneath, patient says podiatry puts "acid" on this  3.  Sacrum Stage 2 0.5 cm x 0.5 cm x 0.1 cm 100% pink moist  Wound bed: as above  Drainage (amount, consistency, odor) minimal serosanguinous to nose, sacrum; dry callus L foot  Periwound: MASD to buttocks/sacrum/coccyx as above  Dressing procedure/placement/frequency:  Clean bridge of nose with NS, apply Neosporin twice daily, cover with silicone foam to protect from Bipap. Paint L plantar foot callused area with Betadine twice daily to keep dry.  Apply silicone foam to stage 2 sacrum, apply Gerhardt's Butt Cream to surrounding buttocks/sacrum/coccyx 3 times a day and prn soiling. POC discussed with patient. She requested Neosporin to nose as this is what is used at facility.    WOC team will not follow. Re-consult if further needs arise.   Thank you,    Priscella Mann MSN, RN-BC, CWOCN [phone]  :

## 2023-06-15 NOTE — Progress Notes (Signed)
PROGRESS NOTE    Toni [name]  ZOX:[id number] DOB: [birth year] DOA: 06/14/2023 PCP: Sherron Monday, MD   Brief Narrative:  This 77 yrs old female with PMH significant of MS, chronic spasticity, status post baclofen pump, mood disorder, chronic Foley catheter due to neurogenic bladder, chronic A-fib, chronic HFpEF, GERD, hypothyroidism, who [facility] with complaints of [facility]iness.  Patient went to follow-up with her nurse practitioner couple of days ago at which time she had complained of dizziness and labs were drawn.  She was found to have low sodium 124 and potassium 2.7 therefore she was referred to the hospital for further workup and treatment.  She was also found to have  UTI on 10/19 and started on ciprofloxacin for 7 days.  At baseline she is wheelchair-bound.  She follows up with neurology clinic for baclofen pump refill.  Assessment & Plan:   Principal Problem:   Hyponatremia Active Problems:   UTI (urinary tract infection)   Permanent atrial fibrillation (HCC)   (HFpEF) heart failure with preserved ejection fraction (HCC)   Hypothyroidism   Neurogenic urinary bladder disorder   Chronic constipation with overflow incontinence   ACTH dependent Cushing's syndrome (HCC)   Depression with anxiety   Multiple sclerosis (HCC)   Bladder spasms   Presence of intrathecal pump   Spastic quadriplegia (HCC)   Wheelchair dependent   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   Hypokalemia  Hyponatremia, likely hypovolemic: Clinically does not appear hypervolemic. She takes torsemide at home.  Will hold for now. She has received IV saline bolus in ED with improvement in sodium level. Continue regular diet, free water restriction.  Monitor BMP every 4 hours. Hold Lexapro and Wellbutrin which may be contributing.  UTI (urinary tract infection): She was started on ciprofloxacin on 10/19. Continue ciprofloxacin  for remaining days.   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 She also intermittently uses midodrine for hypotension. She was switched from Cardizem to metoprolol for rate control few months ago.  Less likely that is the etiology. There is no neurological deficit at this time.  No indication for neuroimaging at present. Hold metoprolol and change midodrine to scheduled 2.5 mg 3 times daily. PT OT consulted.   Permanent atrial fibrillation: Hold metoprolol due to hypotension.  Continue Eliquis. She was on Cardizem in the past for rate control. Currently holding metoprolol due to hypotension.   Holding torsemide due to euvolemic status with dizziness.   Hypothyroidism: TSH normal.  Free T4 mildly elevated. Continue Synthroid 75 mcg daily.   Neurogenic urinary bladder disorder: Continue Foley catheter, present on admission.   Chronic constipation with overflow incontinence. Continue home regimen.   ACTH dependent Cushing's syndrome (HCC) Follows up with neurology. Unable to get her ACTH injection due to insurance denial for now. Cortisol level is reassuring.   Depression with anxiety: Holding Lexapro and Wellbutrin.   Multiple sclerosis (HCC) Chronic spasticity. On baclofen as needed,  currently on hold due to dizziness. Continue gabapentin 300 mg 4 times a day and 600 mg nightly. Currently continues in her milligram 5 times a day only.   Central sleep apnea : Chronic hypoxic respiratory failure. Continue BiPAP nightly.  Has 5 L oxygen along with BiPAP as well. Already has some nasal bridge skin breakdown.   Hypokalemia: Replaced. Continue to monitor.  Bladder spasms: Continue oxybutynin as needed.   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   DVT prophylaxis:  Code Status: Full  code Family Communication: No Family at bed side. Disposition Plan:  Status is: Observation The patient remains OBS appropriate and will d/c before 2 midnights.   She is admitted for dizziness likely secondary to electrolyte imbalance ( hyponatremia and hypokalemia) replaced.  Continue to monitor  Consultants:  None  Procedures:  Antimicrobials:  Anti-infectives (From admission, onward)    Start     Dose/Rate Route Frequency Ordered Stop   06/14/23 1000  ciprofloxacin (CIPRO) tablet 500 mg        500 mg Oral 2 times daily 06/14/23 5638 06/14/23 2127      Subjective: Patient was seen and examined at bedside.  Overnight events noted. Patient still reports feeling dizzy but denies any other symptoms. She denies any urinary burning.   Objective: Vitals:   06/14/23 1722 06/14/23 1956 06/15/23 0500 06/15/23 0736  BP: (!) 111/56 (!) 103/49  (!) 91/39  Pulse: 75 (!) 124  71  Resp:  18  17  Temp:  98.5 F (36.9 C)  97.7 F (36.5 C)  TempSrc:  Oral    SpO2:  96%  99%  Weight:   95.3 kg   Height:        Intake/Output Summary (Last 24 hours) at 06/15/2023 1047 Last data filed at 06/15/2023 1018 Gross per 24 hour  Intake 952 ml  Output 1400 ml  Net -448 ml   Filed Weights   06/14/23 0343 06/15/23 0500  Weight: 89.4 kg 95.3 kg    Examination:  General exam: Appears calm and comfortable, deconditioned, not in any acute distress. Respiratory system: CTA Bilaterally. Respiratory effort normal.  RR 15 Cardiovascular system: S1 & S2 heard, RRR. No JVD, murmurs, rubs, gallops or clicks. No pedal edema. Gastrointestinal system: Abdomen is non distended, soft and non tender.  Normal bowel sounds heard. Central nervous system: Alert and oriented x 3. No focal neurological deficits. Extremities: Lower extremity weakness.  No edema, no cyanosis. Skin: No rashes, lesions or ulcers Psychiatry: Judgement and insight appear normal. Mood & affect appropriate.     Data Reviewed:  I have personally reviewed following labs and imaging studies  CBC: Recent Labs  Lab 06/14/23 0418 06/15/23 0630  WBC 7.8 7.0  NEUTROABS 3.3  --   HGB 12.9 12.2  HCT 36.4 35.7*  MCV 88.1 90.2  PLT 248 248   Basic Metabolic Panel: Recent Labs  Lab 06/14/23 0418 06/14/23 0820 06/14/23 1414 06/14/23 2018 06/15/23 0630  NA 123* 125* 126* 126* 129*  K 3.5 3.3* 4.1 4.5 4.2  CL 79* 82* 83* 84* 90*  CO2 33* 31 34* 32 31  GLUCOSE 80 83 124* 123* 81  BUN 23 21 20 18 16   CREATININE 0.90 0.72 0.83 0.67 0.66  CALCIUM 8.9 8.4* 8.3* 8.4* 8.6*  MG  --   --  1.9  --  2.2   GFR: Estimated Creatinine Clearance: 65.7 mL/min (by C-G formula based on SCr of 0.66 mg/dL). Liver Function Tests: Recent Labs  Lab 06/14/23 0418  AST 19  ALT 10  ALKPHOS 60  BILITOT 0.6  PROT 6.2*  ALBUMIN 3.3*   No results for input(s): "LIPASE", "AMYLASE" in the last 168 hours. No results for input(s): "AMMONIA" in the last 168 hours. Coagulation Profile: No results for input(s): "INR", "PROTIME" in the last 168 hours. Cardiac Enzymes: No results for  input(s): "CKTOTAL", "CKMB", "CKMBINDEX", "TROPONINI" in the last 168 hours. BNP (last 3 results) No results for input(s): "PROBNP" in the last 8760 hours. HbA1C: No results for input(s): "HGBA1C" in the last 72 hours. CBG: No results for input(s): "GLUCAP" in the last 168 hours. Lipid Profile: No results for input(s): "CHOL", "HDL", "LDLCALC", "TRIG", "CHOLHDL", "LDLDIRECT" in the last 72 hours. Thyroid Function Tests: Recent Labs    06/14/23 0820  TSH 1.494  FREET4 2.18*   Anemia Panel: No results for input(s): "VITAMINB12", "FOLATE", "FERRITIN", "TIBC", "IRON", "RETICCTPCT" in the last 72 hours. Sepsis Labs: No results for input(s): "PROCALCITON", "LATICACIDVEN" in the last 168 hours.  No results found for this or any previous visit (from the past 240 hour(s)).   Radiology Studies: No results found.  Scheduled Meds:  ALPRAZolam  0.5 mg  Oral QHS   apixaban  5 mg Oral BID   famotidine  20 mg Oral BID   gabapentin  300 mg Oral Q4H while awake   Gerhardt's butt cream   Topical TID   lactulose  10 g Oral BID   levothyroxine  75 mcg Oral QAC breakfast   melatonin  5 mg Oral QHS   metoprolol succinate  25 mg Oral Daily   midodrine  5 mg Oral TID WC   neomycin-bacitracin-polymyxin   Topical BID   psyllium  1 packet Oral BID   senna  17.2 mg Oral BID   Continuous Infusions:   LOS: 0 days    Time spent: 50 mins    Willeen Niece, MD [facility]   If 7PM-7AM, please contact night-coverage

## 2023-06-16 DIAGNOSIS — E871 Hypo-osmolality and hyponatremia: Secondary | ICD-10-CM | POA: Diagnosis not present

## 2023-06-16 LAB — MAGNESIUM: Magnesium: 2.3 mg/dL (ref 1.7–2.4)

## 2023-06-16 NOTE — Plan of Care (Signed)

## 2023-06-16 NOTE — Discharge Summary (Signed)
Physician Discharge Summary  Toni [name]:[id number] DOB: [birth year] DOA: 06/14/2023  PCP: Sherron Monday, MD  Admit date: 06/14/2023  Discharge date: 06/16/2023  Admitted From: Home.  Disposition:  [facility]  Recommendations for Outpatient Follow-up:  Follow up with PCP in 1-2 weeks. Please obtain BMP/CBC in one week Advised to hold Lexapro for now and resume slowly. Advised to resume torsemide as prescribed  Home Health:None Equipment/Devices:Baclofen Pump, Chronic foley  Discharge Condition: Stable CODE STATUS:Full code Diet recommendation: Heart Healthy  Brief Summary/ Hospital Course: This 77 yrs old female with PMH significant of MS, chronic spasticity, status post baclofen pump, mood disorder, chronic Foley catheter due to neurogenic bladder, chronic A-fib, chronic HFpEF, GERD, hypothyroidism, who presents to the [facility] with complaints of dizziness.  Patient went to follow-up with her nurse practitioner couple of days ago at which time she had complained of dizziness and labs were drawn.  She was found to have low sodium 124 and potassium 2.7 therefore she was referred to the hospital for further workup and treatment.  She was also found to have  UTI on 10/19 and started on ciprofloxacin for 7 days.  At baseline she is wheelchair-bound.  She follows up with neurology clinic for baclofen pump refill.  Patient was continued on IV fluids.  Diuretics, Lexapro and Wellbutrin was discontinued.  Her serum sodium has improved with gentle IV hydration.  Dizziness is improved.  Patient feels better and wants to be discharged.  Her sodium has improved to 131.  Diuretics and Wellbutrin resumed.  Lexapro is kept on hold advised PCP to resume slowly.  Patient is being discharged to [facility].  Discharge Diagnoses:  Principal Problem:   Hyponatremia Active Problems:   UTI (urinary tract infection)   Permanent atrial fibrillation (HCC)    (HFpEF) heart failure with preserved ejection fraction (HCC)   Hypothyroidism   Neurogenic urinary bladder disorder   Chronic constipation with overflow incontinence   ACTH dependent Cushing's syndrome (HCC)   Depression with anxiety   Multiple sclerosis (HCC)   Bladder spasms   Presence of intrathecal pump   Spastic quadriplegia (HCC)   Wheelchair dependent   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   Hypokalemia  Hyponatremia, likely hypovolemic: Clinically does not appear hypervolemic. She takes torsemide at home. It was kept on hold. She has received IV saline bolus in ED with improvement in sodium level. Continue regular diet, free water restriction.  Monitor BMP every 4 hours. Hold Lexapro and Wellbutrin which may be contributing. Serum sodium improved.  Wellbutrin resumed.  Hold Lexapro.   UTI (urinary tract infection): She was started on ciprofloxacin on 10/19. Continue ciprofloxacin for remaining days.   Dizziness: Likely from hypotension. 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 She also intermittently uses midodrine for hypotension. She was switched from Cardizem to metoprolol for rate control few months ago.  Less likely that is the etiology. There is no neurological deficit at this time.  No indication for neuroimaging at present. Hold metoprolol and change midodrine to scheduled 2.5 mg 3 times daily. Blood pressure improved.  Continue midodrine and metoprolol.   Permanent atrial fibrillation: Held metoprolol due to hypotension.  Continue Eliquis. She was on Cardizem in the past for rate control. Currently holding metoprolol due to hypotension.   Holding torsemide due to euvolemic status with dizziness. Metoprolol  and torsemide resumed.   Hypothyroidism: TSH normal.  Free T4 mildly elevated. Continue Synthroid 75 mcg  daily.   Neurogenic urinary bladder disorder: Continue Foley catheter, present on admission.   Chronic constipation with overflow incontinence. Continue home regimen.   ACTH dependent Cushing's syndrome (HCC) Follows up with neurology. Unable to get her ACTH injection due to insurance denial for now. Cortisol level is reassuring.   Depression with anxiety: Resume Wellbutrin, hold Lexapro for now.   Multiple sclerosis (HCC) Chronic spasticity. On baclofen as needed,  currently on hold due to dizziness. Continue gabapentin 300 mg 4 times a day and 600 mg nightly. Currently continues in her milligram 5 times a day only.   Central sleep apnea : Chronic hypoxic respiratory failure. Continue BiPAP nightly.  Has 5 L oxygen along with BiPAP as well. Already has some nasal bridge skin breakdown.   Hypokalemia: Replaced. Continue to monitor.   Bladder spasms: Continue oxybutynin as needed.   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    Discharge Instructions  Discharge Instructions     Call MD for:  difficulty breathing, headache or visual disturbances   Complete by: As directed    Call MD for:  persistant dizziness or light-headedness   Complete by: As directed    Call MD for:  persistant nausea and vomiting   Complete by: As directed    Diet - low sodium heart healthy   Complete by: As directed    Diet Carb Modified   Complete by: As directed    Discharge instructions   Complete by: As directed    Advised to follow-up with primary care physician in 1 week. Advised to hold Lexapro for now and resume slowly. Advised to resume torsemide as prescribed   Discharge wound care:   Complete by: As directed    Follow-up wound care at SNF.   Increase activity slowly   Complete by: As directed       Allergies as of 06/16/2023       Reactions   Other Hives, Rash   Topical Surgical prep  - severe rash, hives   Hydrocodone Nausea And  Vomiting   Oxycontin [oxycodone] Nausea And Vomiting   Zoloft [sertraline] Hives, Swelling, Rash   Chloraseptic [phenol] Hives   Not documented on MAR   Macrobid [nitrofurantoin] Nausea And Vomiting   Not documented on MAR   Vioxx [rofecoxib] Other (See Comments)   Unknown reaction Not documented on MAR Can take NSAIDS   Hibiclens [chlorhexidine Gluconate] Hives, Rash, Other (See Comments)   Sores        Medication List     STOP taking these medications    escitalopram 10 MG tablet Commonly known as: Lexapro       TAKE these medications    acetaminophen 500 MG tablet Commonly known as: TYLENOL Take 1,000 mg by mouth every 8 (eight) hours as needed for fever or headache (pain).   ALPRAZolam 0.5 MG tablet Commonly known as: XANAX Take 1 tablet (0.5 mg total) by mouth at bedtime. May also take 1 tablet every 12 hours as needed anxiety What changed: additional instructions   baclofen 20 MG tablet Commonly known as: LIORESAL Take 20 mg by mouth daily as needed for muscle spasms.   Biotin 40981 MCG Tabs Take 10,000 mcg by mouth daily after lunch.   bisacodyl 10 MG suppository Commonly known as: DULCOLAX Place 10 mg rectally See admin instructions. Insert 1 suppository rectally  once a day  on Sun, Tue, Thurs   buPROPion 150 MG 24 hr tablet Commonly known as: WELLBUTRIN XL Take 1 tablet (150 mg total) by mouth daily.   Calcium 500/D 500-10 MG-MCG Chew Generic drug: Calcium Carb-Cholecalciferol Chew 1 tablet by mouth 2 (two) times daily.   CeraVe Daily Moisturizing Lotn Apply 1 application  topically daily. To groin, inguinal area   Cerovite Senior Tabs Take 1 tablet by mouth daily.   cetirizine 10 MG tablet Commonly known as: ZYRTEC Take 10 mg by mouth daily as needed for allergies.   cholecalciferol 25 MCG (1000 UNIT) tablet Commonly known as: VITAMIN D3 Take 2,000 Units by mouth 2 (two) times daily with breakfast and lunch.   ciprofloxacin 500 MG  tablet Commonly known as: CIPRO Take 500 mg by mouth 2 (two) times daily.   corticotropin 80 UNIT/ML injectable gel Commonly known as: ATHACAR H.P. Give subcutaneous 80 units every other day for 10 days ( 5 shots ) a month.   Acthar 80 UNIT/ML injectable gel Generic drug: corticotropin Inject 1 mL (80 Units total) into the skin See admin instructions for 10 days. Inject 80 units as needed (about 5 times a month)   diphenhydrAMINE 25 mg capsule Commonly known as: BENADRYL Take 25 mg by mouth 3 (three) times daily as needed (minor itching, irritation).   docusate sodium 100 MG capsule Commonly known as: COLACE Take 100 mg by mouth 2 (two) times daily.   Eliquis 5 MG Tabs tablet Generic drug: apixaban Take 5 mg by mouth 2 (two) times daily.   famotidine 20 MG tablet Commonly known as: PEPCID Take 20 mg by mouth 2 (two) times daily.   fluticasone 50 MCG/ACT nasal spray Commonly known as: FLONASE Place 1 spray into both nostrils daily as needed (for nasal congestion).   gabapentin 300 MG capsule Commonly known as: NEURONTIN Mrs. Heinle will take 300 mg of gabapentin at 6 AM, 10 AM, 2 PM and 6 PM and 600 mg at 10 PM by mouth. What changed:  how much to take how to take this when to take this additional instructions   ibuprofen 400 MG tablet Commonly known as: ADVIL Take 400 mg by mouth 3 (three) times daily.   lactulose 10 GM/15ML solution Commonly known as: CHRONULAC Take 15 mLs (10 g total) by mouth 2 (two) times daily as needed for mild constipation. What changed: when to take this   levothyroxine 75 MCG tablet Commonly known as: SYNTHROID Take 75 mcg by mouth daily before breakfast.   melatonin 5 MG Tabs Take 5 mg by mouth at bedtime.   METAMUCIL PO Take 1 packet by mouth 2 (two) times daily. Metamucil 2.4g/5.4g powder packets.   metolazone 2.5 MG tablet Commonly known as: ZAROXOLYN Take 2.5 mg by mouth daily as needed (weight gain > 3 lbs in 2 days with  swelling.).   metoprolol succinate 25 MG 24 hr tablet Commonly known as: TOPROL-XL Take 1 tablet (25 mg total) by mouth daily.   midodrine 2.5 MG tablet Commonly known as: PROAMATINE Take 2 tablets (5 mg total) by mouth 3 (three) times daily as needed (hypotension (systolic blood pressure less than 100)).   Motegrity 2 MG Tabs Generic drug: Prucalopride Succinate Take 2 mg by mouth daily.   NONFORMULARY OR COMPOUNDED ITEM Apply 1 Application topically See admin instructions. 1:1 clotrimazole 1% and hydrocortisone 1%. Apply twice daily to perineal region and abdominal folds.   ondansetron 4 MG disintegrating tablet Commonly known as: ZOFRAN-ODT Take 4 mg  by mouth 4 (four) times daily as needed for nausea.   oxybutynin 5 MG tablet Commonly known as: DITROPAN Take 5 mg by mouth 2 (two) times daily as needed (for overactive bladder).   phenazopyridine 100 MG tablet Commonly known as: PYRIDIUM Take 100 mg by mouth every 8 (eight) hours as needed (dysuria).   polyethylene glycol 17 g packet Commonly known as: MIRALAX / GLYCOLAX Take 17 g by mouth daily.   potassium chloride SA 20 MEQ tablet Commonly known as: KLOR-CON M Take 2 tablets (40 mEq total) by mouth daily. What changed: when to take this   Salicylic Acid-Cleanser 6 % CREAM Kit Apply 1 each topically See admin instructions. Apply topically on Mon, Wed, Sat to lesion on foot   senna 8.6 MG Tabs tablet Commonly known as: SENOKOT Take 17.2 mg by mouth 2 (two) times daily.   sodium phosphate 7-19 GM/118ML Enem Place 1 enema rectally daily as needed (constipation).   Systane 0.4-0.3 % Soln Generic drug: Polyethyl Glycol-Propyl Glycol Place 1 drop into both eyes in the morning and at bedtime.   torsemide 20 MG tablet Commonly known as: DEMADEX Take 1.5 tablets (30 mg total) by mouth 2 (two) times daily.               Discharge Care Instructions  (From admission, onward)           Start     Ordered    06/16/23 0000  Discharge wound care:       Comments: Follow-up wound care at [facility].   06/16/23 1049            Contact information for follow-up providers     Sherron Monday, MD Follow up in 1 week(s).   Specialty: Internal Medicine Contact information: [address] [place] [address] [phone]              Contact information for after-discharge care     Destination     [facility] Preferred SNF .   Service: Skilled Nursing Contact information: [name] [place] [address] [phone]                    Allergies  Allergen Reactions   Other Hives and Rash    Topical Surgical prep  - severe rash, hives   Hydrocodone Nausea And Vomiting   Oxycontin [Oxycodone] Nausea And Vomiting   Zoloft [Sertraline] Hives, Swelling and Rash   Chloraseptic [Phenol] Hives    Not documented on MAR   Macrobid [Nitrofurantoin] Nausea And Vomiting    Not documented on MAR   Vioxx [Rofecoxib] Other (See Comments)    Unknown reaction Not documented on MAR Can take NSAIDS   Hibiclens [Chlorhexidine Gluconate] Hives, Rash and Other (See Comments)    Sores    Consultations: None  Procedures/Studies: No results found.   Subjective: Patient was seen and examined at bedside.  Overnight events noted.   Patient reports doing much better,  sodium has improved.  She denies further dizziness.  Discharge Exam: Vitals:   06/16/23 0344 06/16/23 0745  BP: 115/65 117/63  Pulse: (!) 54 80  Resp: 18 16  Temp: 98.3 F (36.8 C) (!) 97.4 F (36.3 C)  SpO2: 99% 97%   Vitals:   06/15/23 1527 06/15/23 1952 06/16/23 0344 06/16/23 0745  BP: (!) 97/53 94/78 115/65 117/63  Pulse: 99 67 (!) 54 80  Resp: 18 18 18 16   Temp:  98.6 F (37 C) 98.3 F (36.8 C) 98.3 F (36.8 C) (!) 97.4 F (36.3 C)  TempSrc:  Oral  Oral  SpO2: 97% 97% 99% 97%  Weight:   94.6 kg   Height:        General: Pt is alert,  awake, not in acute distress Cardiovascular: RRR, S1/S2 +, no rubs, no gallops Respiratory: CTA bilaterally, no wheezing, no rhonchi Abdominal: Soft, NT, ND, bowel sounds + Extremities: no edema, no cyanosis    The results of significant diagnostics from this hospitalization (including imaging, microbiology, ancillary and laboratory) are listed below for reference.     Microbiology: No results found for this or any previous visit (from the past 240 hour(s)).   Labs: BNP (last 3 results) Recent Labs    11/22/22 0104  BNP 308.6*   Basic Metabolic Panel: Recent Labs  Lab 06/14/23 1414 06/14/23 2018 06/15/23 0630 06/15/23 0956 06/15/23 1407 06/16/23 0425  NA 126* 126* 129* 132* 131*  --   K 4.1 4.5 4.2 3.8 4.4  --   CL 83* 84* 90* 90* 89*  --   CO2 34* 32 31 32 34*  --   GLUCOSE 124* 123* 81 116* 134*  --   BUN 20 18 16 13 13   --   CREATININE 0.83 0.67 0.66 0.64 0.56  --   CALCIUM 8.3* 8.4* 8.6* 8.7* 8.6*  --   MG 1.9  --  2.2  --   --  2.3   Liver Function Tests: Recent Labs  Lab 06/14/23 0418  AST 19  ALT 10  ALKPHOS 60  BILITOT 0.6  PROT 6.2*  ALBUMIN 3.3*   No results for input(s): "LIPASE", "AMYLASE" in the last 168 hours. No results for input(s): "AMMONIA" in the last 168 hours. CBC: Recent Labs  Lab 06/14/23 0418 06/15/23 0630  WBC 7.8 7.0  NEUTROABS 3.3  --   HGB 12.9 12.2  HCT 36.4 35.7*  MCV 88.1 90.2  PLT 248 248   Cardiac Enzymes: No results for input(s): "CKTOTAL", "CKMB", "CKMBINDEX", "TROPONINI" in the last 168 hours. BNP: Invalid input(s): "POCBNP" CBG: No results for input(s): "GLUCAP" in the last 168 hours. D-Dimer No results for input(s): "DDIMER" in the last 72 hours. Hgb A1c No results for input(s): "HGBA1C" in the last 72 hours. Lipid Profile No results for input(s): "CHOL", "HDL", "LDLCALC", "TRIG", "CHOLHDL", "LDLDIRECT" in the last 72 hours. Thyroid function studies Recent Labs    06/14/23 0820  TSH 1.494   Anemia  work up No results for input(s): "VITAMINB12", "FOLATE", "FERRITIN", "TIBC", "IRON", "RETICCTPCT" in the last 72 hours. Urinalysis    Component Value Date/Time   COLORURINE YELLOW 06/14/2023 0621   APPEARANCEUR CLEAR 06/14/2023 0621   APPEARANCEUR Clear 12/18/2014 1156   LABSPEC 1.005 06/14/2023 0621   PHURINE 7.0 06/14/2023 0621   GLUCOSEU NEGATIVE 06/14/2023 0621   HGBUR SMALL (A) 06/14/2023 0621   BILIRUBINUR NEGATIVE 06/14/2023 0621   BILIRUBINUR Negative 12/18/2014 1156   KETONESUR NEGATIVE 06/14/2023 0621   PROTEINUR NEGATIVE 06/14/2023 0621   UROBILINOGEN 0.2 08/05/2014 0951   NITRITE NEGATIVE 06/14/2023 0621   LEUKOCYTESUR LARGE (A) 06/14/2023 0621   Sepsis Labs Recent Labs  Lab 06/14/23 0418 06/15/23 0630  WBC 7.8 7.0   Microbiology No results found for this or any previous visit (from the past 240 hour(s)).   Time coordinating discharge: Over 30 minutes  SIGNED:   Willeen Niece, MD  [facility] 06/16/2023, 10:57 AM Pager   If 7PM-7AM,  please contact night-coverage

## 2023-06-16 NOTE — TOC Transition Note (Addendum)
Transition of Care [facility]) - CM/SW Discharge Note   Patient Details  Name: Toni [name] MRN: [id number] Date of Birth: 27-Aug-1945  [facility] CM/SW Contact:  Inis Sizer, LCSW Phone Number: 06/16/2023, 10:14 AM   Clinical Narrative:    CSW spoke with Britta Mccreedy, RN at [facility] who states patient can return to the facility today. Patient will be transported via [facility] - pick up scheduled for 12pm. The number to call for report is [phone].  CSW will fax discharge summary once finalized by MD.  Med necessity completed and printed to unit.  CSW spoke with patient's husband to inform of him discharge plan - husband states he spoke with his wife this morning and is in agreement with plan.   Final next level of care: Skilled Nursing Facility Barriers to Discharge: Continued Medical Work up   Patient Goals and CMS Choice      Discharge Placement                         Discharge Plan and Services Additional resources added to the After Visit Summary for     Discharge Planning Services: CM Consult Post Acute Care Choice: Skilled Nursing Facility                               Social Determinants of Health (SDOH) Interventions SDOH Screenings   Food Insecurity: No Food Insecurity (06/14/2023)  Housing: Low Risk  (06/14/2023)  Transportation Needs: No Transportation Needs (06/14/2023)  Utilities: Not At Risk (06/14/2023)  Depression (PHQ2-9): Low Risk  (08/06/2018)  Tobacco Use: Medium Risk (06/14/2023)     Readmission Risk Interventions     No data to display

## 2023-06-16 NOTE — Discharge Instructions (Signed)
Advised to hold Lexapro for now and resume slowly. Advised to resume torsemide as prescribed

## 2023-06-19 ENCOUNTER — Encounter: Payer: Self-pay | Admitting: Internal Medicine

## 2023-06-19 ENCOUNTER — Ambulatory Visit: Payer: Medicare Other | Attending: Internal Medicine | Admitting: Internal Medicine

## 2023-06-19 VITALS — BP 110/68 | HR 116 | Ht 63.0 in | Wt 196.4 lb

## 2023-06-19 DIAGNOSIS — I5042 Chronic combined systolic (congestive) and diastolic (congestive) heart failure: Secondary | ICD-10-CM | POA: Diagnosis present

## 2023-06-19 DIAGNOSIS — I4821 Permanent atrial fibrillation: Secondary | ICD-10-CM | POA: Insufficient documentation

## 2023-06-19 DIAGNOSIS — G35 Multiple sclerosis: Secondary | ICD-10-CM | POA: Insufficient documentation

## 2023-06-19 LAB — BASIC METABOLIC PANEL: EGFR: 90

## 2023-06-19 NOTE — Patient Instructions (Signed)
Medication Instructions:  No changes   *If you need a refill on your cardiac medications before your next appointment, please call your pharmacy*   Lab Work:  Not needed   Testing/Procedures: Not needed   Follow-Up: At [facility], you and your health needs are our priority.  As part of our continuing mission to provide you with exceptional heart care, we have created designated Provider Care Teams.  These Care Teams include your primary Cardiologist (physician) and Advanced Practice Providers (APPs -  Physician Assistants and Nurse Practitioners) who all work together to provide you with the care you need, when you need it.     Your next appointment:   6 month(s)  The format for your next appointment:   In Person  Provider:   Juanda Crumble, PA-C, Joni Reining, DNP, ANP, or Bernadene Person, NP    Then, Chrystie Nose, MD will plan to see you again in 12 month(s).

## 2023-06-19 NOTE — Progress Notes (Signed)
OFFICE NOTE  Chief Complaint:  Follow-up hospitalization  Primary Care Physician: Sherron Monday, MD  HPI:  Toni [name] is a 77 year old female with multiple sclerosis, paroxysmal atrial fibrillation, on Coumadin, morbid obesity, obstructive sleep apnea, and dyslipidemia. She has had worsening swelling in her legs, thought to be due to an MS flare. She has a history of varicose veins with severe venous insufficiency in both the right and left greater saphenous veins. The right greater saphenous vein was actually successfully ablated by my partner, Dr. Lynnea Ferrier, about 1 to 2 years ago. She does have residual reflux in the left greater saphenous vein extending from the proximal calf up to the saphenofemoral junction. The vein measured between 4 and 8 mm with between 1 and 6 seconds of reflux. We had discussed ablation of that before, but she has canceled several times at procedures. At this point, I think there is little benefit to continuing to try for her to undergo ablation. 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 She can wear work showed a compression stockings and this does seem to help her. With regards to her atrial fibrillation she has not had significant recurrence to her knowledge. She continues on warfarin and has been therapeutic, with her INR of 1.9 in the office by fingerstick today.  Toni [name] returns today for followup. She reports her MS is somewhat progressive. She continues to have some enlargement of her legs but no significant swelling. There is a mild varicosities in the right lower extremity. She is unaware of her atrial fibrillation. INR today was 2.3.   I saw [name] back in the office today,  She is describing more shortness of breath and feels that she's been in A. Fib more recently. In fact today6/21her EKG does show persistent atrial fibrillation. In the past she's been paroxysmal , but it may be that she is more  frequently having A. Fib. She has trouble telling whether not she is more short of breath or symptomatic from A. Fib versus her multiple sclerosis. She is also reporting more swelling in her left leg.  Toni [name] returns for follow-up today. She recently saw her neurologist regarding her MS which seems to be flaring. In addition she was noted to have significant apneic events despite using CPAP. There is concern for possible central sleep apnea. Both of these episodes are probably contributing to her recurrent atrial fibrillation. She's been wearing a monitor for a period of time which indicates a burden of A. fib of about 80% of the time. It does not appear that she is in persistent A. fib, rather she has had some episodes of sinus rhythm, but they're much less frequent than her A. Fib.  I saw Toni [name] back today in the office. Recently I saw her as an add-on visit briefly as she was describing some chest pain when she was getting her INR checked. An EKG at that time showed no ischemia. I did not feel that this was cardiac. She continues intermittently have abnormal pains both in her chest as well as her abdomen and extremities. This may be related to MS. She's had difficulty with CPAP and apparently getting a good mask fit. She has been a persistent atrial fibrillation for about 6 months. It's unclear whether or not she symptomatic with this and therefore I have not pursued a rhythm control strategy. Her rate control is generally pretty good and she has been therapeutic on  warfarin. She is reporting worsening swelling of her legs although her weight she says is been stable. She has a history of venous insufficiency and had a right greater saphenous vein ablation by one of my partners. She's concerned about more swelling in her left leg and whether she's developed worsening venous insufficiency. She also reports cold feet with numbness in her extremities. As she is basically wheelchair bound, I cannot assess  whether she is having any claudication.  Toni [name] returns today for follow-up. She underwent venous and arterial lower external Dopplers. Arterial Dopplers were normal which showed no evidence of obstruction. Her venous Doppler showed no DVT. There was significant venous reflux in the left greater saphenous vein. This is an area where she has swelling and discomfort. This may be amenable to treatment. I increased her Lasix and she reports some improvement in her leg swelling. She's trying to elevate her feet and reports she is intolerant of wearing compression stockings.  02/03/2016  Toni [name] was seen today in the office for follow-up of recent hospitalization. She was seeing her neurologist and complained of left chest and left arm pain and was referred to the hospital. She ruled out for MI and was felt that her chest pain was on coronary. She underwent an echocardiogram which showed low-normal EF but was also found to be in A. fib with rapid ventricular response. Her A. fib is persistent at this time but the rate was elevated. She was started on Cardizem which helped improve her rate and was discharged on Cardizem CD 120 mg daily. Heart rate today is better controlled in the 70s. She reports her arm pain is improved and she said today that she "did not feel that it was her heart".  05/09/2016  Toni [name] was seen in the office for follow-up today. She reports improved heart rate control with heart rates between 50 and 80 on diltiazem. Over this will help with her mild cardiomyopathy with EF 50-55% on recent echo. She reports some increased fluid gain particularly in her abdomen. This may be due to some degree of right heart dysfunction. She's working with her neurologist for MS as well as her sleep apnea. There is a component of central sleep apnea and she is reportedly going to have additional sleep study to look for the need for BiPAP therapy. She denies any recurrent chest pain or left arm  pain.  02/14/2017  Toni [name] was seen today in follow-up. She was recently hospitalized. She said problems with increasing fluid gain. I increased her Lasix last time however when she was hospitalized she required high-dose Lasix and says she has she felt better with that. She subsequently been put on BiPAP therapy. She says her sleeping is improved significantly. She is concerned though recently she's had some increased weight gain. She feels like she may need an increasing dose of diuretic. Her last echo was in December 2017 which showed an EF of 55%. This is been stable. If this is some heart failure, may be related diastolic dysfunction however suspect is more likely related to autonomic dysfunction the setting of multiple sclerosis.  03/29/2017  Toni [name] returns today for follow-up. She continues to report lower extremity edema and abdominal swelling. We'll increase her Lasix however she has not noted significant weight loss. It is actually very difficult to estimate her weight because of her MS. We tried to place her on the scale today however she was unable to stand long enough to get an accurate  weight. She reports urination was not as good on 40 mg of Lasix but is currently taking 80 mg once daily and has better urination with that.  05/18/2017  Toni [name] was seen today in follow-up. She reports some improvement in her abdominal swelling and bloating. Check she feels like she might be somewhat dry now. Her weight is down to 246 pounds from 253 pounds. She is also working with the weight loss clinic at [facility]. She is interested in decreasing her diuretic which is reasonable. She asked again about probably trying to get back into sinus rhythm however she's been in persistent A. fib which is long-standing at this point for more than one or 2 years. Her last echo in December 2017 showed mild left atrial enlargement. Is not clear that she's symptomatic with her A. fib although does feel tired at  times. She says she's had some improvement with her sleep apnea therapy.  01/15/2018  Toni [name] returns today for follow-up.  She is continued to lose some weight.  Weight is down now to 218 pounds, significant improvement from her prior weight over 250 pounds.  She also had marked reduction in edema.  Her blood pressure is low normal at the moment and may benefit from reduction in her blood pressure medications.  Her sleep apnea has improved significantly according to Dr. Vickey Huger, especially her obstructive symptoms.  She still has central sleep apnea.  In addition she has secondary progressive MS and recently had worsening symptoms associated with that.  Heart rate was elevated today with regards to A. fib however that did come down after short period of time.  She again appears to be asymptomatic with her A. fib.  The main reason not to pursue sinus rhythm is the fact that she has been persistently in A. fib for well over 5 years, and the likelihood of reestablishing sinus rhythm is exceedingly low.  05/16/2018  Toni [name] is seen today in follow-up.  Her weight is down even further today to 204.  She has had recently some positional dizziness and low blood pressures.  This is likely due to the significant weight loss and I would recommend a decrease in her medications.  She says that she is breathing better.  Her sleep apnea has improved significantly according to her sleep doctor and she has having less symptoms related to her MS.  She is in permanent A. fib with controlled ventricular response.  03/10/2019  Toni [name] returns today for follow-up.  She is done very well overall.  She is continued to lose weight with Dr. Francena Hanly work.  Weight now 186 pounds down from 210 when I last saw her.  This is amazing since she is essentially sedentary due to her MS.  She reports reasonable control of her A. fib.  She has had some decrease in her PA P requirements with regards to sleep apnea.  Blood pressure is  well controlled.  She has recently had some swelling of the lower extremities and her abdomen.  She wonders if this could be some heart failure.  I suspect the lower extremity swelling is most likely due to venous insufficiency.  I previously performed a venous ablation on the right lower extremity and she still has some venous insufficiency of the left lower extremity however in the common femoral vein not amenable to ablation.  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   01/28/2020  Toni [name] is seen today in follow-up.  She continues to lose some weight related to her diet by Dr. Dalbert Garnet.  She today is in A. fib with RVR.  Blood pressure has run low recently.  She has not needed to use midodrine that frequently.  She has follow-up with Dr. Vickey Huger in July.  I recently had corresponded with her about findings that Ms. Drury was hypoxic during the day as well as at night.  There are plans to both give her oxygen via BiPAP as well as possible oxygen during the day.  She has been referred to see Dr. Melida Quitter at [facility] pulmonary.  04/29/2020  Toni [name] seen today for follow-up of A. fib.  Is been 3 months since I last saw her.  In the meantime she has been set up with bleed oxygen into her BiPAP.  I increased her diltiazem which is resulted in some improvement in heart rate although there is still a variability related to her A. fib.  Blood pressure is still reasonable in the low 100 systolic.  She has not required taking additional midodrine.  She feels like she is getting some abdominal bloating/swelling and has started to take some Lasix as needed for this.  She was taken off of this due to orthostasis.  09/20/2020  Toni [name] is seen today in follow-up.  Recently she has been having more difficulty with hypoxemia and her MS.  All of this seems to have started after she had COVID-19.  She recently saw Dr. Vickey Huger and still is having difficulty with hypoxemia.  She is having a repeat sleep  study and I am told I would likely need to prescribe oxygen at night for her since apparently only either pulmonologist or cardiologist can order oxygen.  Recently her home health noted that she was swelling.  Her Lasix was increased to between 20 to 40 mg in the morning and 60 mg at night.  She wonders if this is led to some electrolyte abnormalities as its not been reassessed.  Blood pressure is good today.  Weight has been fairly stable.  09/08/2021  Toni [name] returns for follow-up.  She has been residing at [facility].  More recently we have been notified of some worsening lower extremity edema.  She says she has had some increasing abdominal girth.  The physician assistant there had made some changes in her diuretics.  Actually weight today seems fairly stable.  She is noted to be in A. fib which is permanent but rate controlled.  BNP was performed at their facility and only mildly elevated around 120.  Otherwise labs have been reassuring.  06/08/2022  Toni [name] is seen today in follow-up.  She is residing at [facility].  She has recently noted more abdominal fullness and lower extremity swelling concerning for heart failure although she is on diuretics and has had at least 2 BNP test performed which were negative for any fluid retention.  She remains in the permanent atrial fibrillation which is rate controlled.  She has transition from warfarin to Eliquis due to some labile INRs and the need for frequent antibiotics.  I think this is a good idea and try to get her to switch over to Eliquis for a long time.  This is less likely to interact with her medications and is superior to warfarin.  09/14/2022  Toni [name] is seen today in follow-up.  Unfortunately she has had issues heart failure as well as GI issues since I last saw her.  Mainly she has had issues  with recurrent this.  She cannot tolerate Entresto as I had switched her to this because of low blood pressure.  She was found to have  some cardiomyopathy with LVEF 45 to 50% on echo.  She has responded to increasing doses of torsemide and now is required midodrine for raising her blood pressure.  She says that her breathing has improved.  Weight is stayed stable if not improved somewhat.  She is not sure if she is actually getting her midodrine 3 times a day as scheduled or as needed.  She should maintain systolic blood pressure over 100.  06/20/2023  Toni [name] is seen today in follow-up of recent hospitalization.  Apparently she was hospitalized for hyponatremia and found to have a UTI.  She had was gently hydrated and her diuretics were held.  Fairly she was hypotensive and her beta-blocker was held but she was continued on midodrine.  Now she returns for follow-up.  She is tachycardic in A-fib today with a rate of 116.  She says that she has been getting both the midodrine and requested taking metoprolol.  Oftentimes they do not give her the metoprolol however because of low blood pressure.  Overall though she feels like she is improving.  PMHx:  Past Medical History:  Diagnosis Date   Abnormality of gait 03/01/2015   Acute metabolic encephalopathy 06/04/2020   Anxiety    Arthritis    "knees" (01/19/2016)   Asthma    as a child    Atrial fibrillation (HCC)    Back pain    Bilateral carpal tunnel syndrome 10/04/2020   Cancer (HCC)    / RENAL CELL CARCINOMA - GETS CT SCAN EVERY 6 MONTHS TO WATCH    CHF (congestive heart failure) (HCC)    Chronic pain    "nerve pain from the MS"   Complex atypical endometrial hyperplasia    Constipation    COVID-19 virus infection 08/23/2019   Depression    DVT (deep venous thrombosis) (HCC) 1983   "RLE; may have just been phlebitis; it was before the age of dopplers"   Dysrhythmia    afib    Edema    varicose veins with severe venous insuff in R and L GSV' ablation of R GSV 2012   GERD (gastroesophageal reflux disease)    HLD (hyperlipidemia)    Hypertension    Hypotension     Hypothyroidism    Joint pain    Multiple sclerosis (HCC)    has had this may 1989-[facility] reg doc   OSA on CPAP    bipap machine - SLEEP APNEA WORSE SINCE COVID IN 1/21 PER PATIENT SETTING HAS INCREASED FROM 9 TO 30    Osteoarthritis    PAF (paroxysmal atrial fibrillation) (HCC)    on coumadin; documented on monitor 06/2010 - no longer takes per [facility] 02/09/21   Pneumonia 11/2011   Sleep apnea    uses sleep apnea machine    Past Surgical History:  Procedure Laterality Date   ANTERIOR CERVICAL DECOMP/DISCECTOMY FUSION  01/2004   CARDIAC CATHETERIZATION  06/22/2010   normal coronary arteries, PAF   CATARACT EXTRACTION, BILATERAL Bilateral    CHILD BIRTHS     X2   DILATATION & CURETTAGE/HYSTEROSCOPY WITH MYOSURE N/A 01/15/2020   Procedure: DILATATION & CURETTAGE/HYSTEROSCOPY WITH MYOSURE;  Surgeon: Richardean Chimera, MD;  Location: [facility];  Service: Gynecology;  Laterality: N/A;  pt in wheelchair-has MS   DILATION AND CURETTAGE OF UTERUS     DILATION AND  CURETTAGE OF UTERUS N/A 02/16/2020   Procedure: DILATATION AND CURETTAGE;  Surgeon: Carver Fila, MD;  Location: [facility];  Service: Gynecology;  Laterality: N/A;  NOT GENERAL -NO INTUBATION   DNC     INFUSION PUMP IMPLANTATION  ~ 2009   baclofen infusion in lower abd   INTRATHECAL PUMP REVISION N/A 02/18/2021   Procedure: Baclofen Pump replacement with possible revision of catheter;  Surgeon: Maeola Harman, MD;  Location: [facility] OR;  Service: Neurosurgery;  Laterality: N/A;  3C/RM 20   INTRAUTERINE DEVICE (IUD) INSERTION N/A 02/16/2020   Procedure: INTRAUTERINE DEVICE (IUD) INSERTION - MIRENA;  Surgeon: Carver Fila, MD;  Location: [facility];  Service: Gynecology;  Laterality: N/A;   IUD put in uterus     PAIN PUMP REVISION N/A 07/29/2014   Procedure: Baclofen pump replacement;  Surgeon: Maeola Harman, MD;  Location: [facility];  Service: Neurosurgery;  Laterality: N/A;  Baclofen pump replacement   TONSILLECTOMY AND  ADENOIDECTOMY     VARICOSE VEIN SURGERY  ~ 1968   VENOUS ABLATION  12/16/2010   radiofreq ablation -Dr Lynnea Ferrier and Esvin Hnat   WISDOM TOOTH EXTRACTION      FAMHx:  Family History  Problem Relation Age of Onset   Depression Mother    Sudden death Mother    Anxiety disorder Mother    Coronary artery disease Father        at age 3   Hyperlipidemia Father    Thyroid disease Father    Coronary artery disease Maternal Grandmother    Depression Maternal Grandmother    Cancer Paternal Grandmother        breast   Alcoholism Son    Sleep apnea Neg Hx    Multiple sclerosis Neg Hx     SOCHx:   reports that she quit smoking about 46 years ago. Her smoking use included cigarettes. She started smoking about 53 years ago. She has a 3.5 pack-year smoking history. She has never used smokeless tobacco. She reports that she does not currently use alcohol. She reports that she does not use drugs.  ALLERGIES:  Allergies  Allergen Reactions   Other Hives and Rash    Topical Surgical prep  - severe rash, hives   Hydrocodone Nausea And Vomiting   Oxycontin [Oxycodone] Nausea And Vomiting   Zoloft [Sertraline] Hives, Swelling and Rash   Chloraseptic [Phenol] Hives    Not documented on MAR   Macrobid [Nitrofurantoin] Nausea And Vomiting    Not documented on MAR   Vioxx [Rofecoxib] Other (See Comments)    Unknown reaction Not documented on MAR Can take NSAIDS   Hibiclens [Chlorhexidine Gluconate] Hives, Rash and Other (See Comments)    Sores    ROS: Pertinent items noted in HPI and remainder of comprehensive ROS otherwise negative.  HOME MEDS: Current Outpatient Medications  Medication Sig Dispense Refill   acetaminophen (TYLENOL) 500 MG tablet Take 1,000 mg by mouth every 8 (eight) hours as needed for fever or headache (pain).     ALPRAZolam (XANAX) 0.5 MG tablet Take 1 tablet (0.5 mg total) by mouth at bedtime. May also take 1 tablet every 12 hours as needed anxiety (Patient taking  differently: Take 0.5 mg by mouth at bedtime.) 10 tablet 0   apixaban (ELIQUIS) 5 MG TABS tablet Take 5 mg by mouth 2 (two) times daily.     baclofen (LIORESAL) 20 MG tablet Take 20 mg by mouth daily as needed for muscle spasms.     Biotin  10000 MCG TABS Take 10,000 mcg by mouth daily after lunch.     bisacodyl (DULCOLAX) 10 MG suppository Place 10 mg rectally See admin instructions. Insert 1 suppository rectally  once a day on Sun, Tue, Thurs     buPROPion (WELLBUTRIN XL) 150 MG 24 hr tablet Take 1 tablet (150 mg total) by mouth daily. 90 tablet 1   Calcium Carb-Cholecalciferol (CALCIUM 500/D) 500-10 MG-MCG CHEW Chew 1 tablet by mouth 2 (two) times daily.     cetirizine (ZYRTEC) 10 MG tablet Take 10 mg by mouth daily as needed for allergies.     cholecalciferol 25 MCG (1000 UT) tablet Take 2,000 Units by mouth 2 (two) times daily with breakfast and lunch.     ciprofloxacin (CIPRO) 500 MG tablet Take 500 mg by mouth 2 (two) times daily.     corticotropin (ACTHAR) 80 UNIT/ML injectable gel Inject 1 mL (80 Units total) into the skin See admin instructions for 10 days. Inject 80 units as needed (about 5 times a month) 5 mL 5   corticotropin (ATHACAR H.P.) 80 UNIT/ML injectable gel Give subcutaneous 80 units every other day for 10 days ( 5 shots ) a month. 5 mL 5   diphenhydrAMINE (BENADRYL) 25 mg capsule Take 25 mg by mouth 3 (three) times daily as needed (minor itching, irritation).     docusate sodium (COLACE) 100 MG capsule Take 100 mg by mouth 2 (two) times daily.     Emollient (CERAVE DAILY MOISTURIZING) LOTN Apply 1 application  topically daily. To groin, inguinal area     famotidine (PEPCID) 20 MG tablet Take 20 mg by mouth 2 (two) times daily.     fluticasone (FLONASE) 50 MCG/ACT nasal spray Place 1 spray into both nostrils daily as needed (for nasal congestion).     gabapentin (NEURONTIN) 300 MG capsule Mrs. Weng will take 300 mg of gabapentin at 6 AM, 10 AM, 2 PM and 6 PM and 600 mg at 10 PM  by mouth. (Patient taking differently: Take 300 mg by mouth See admin instructions. 300 mg 5 times daily at 0600, 1000, 1400, 1800 and 300-600 mg at bedtime.) 180 capsule 11   ibuprofen (ADVIL) 400 MG tablet Take 400 mg by mouth 3 (three) times daily.     lactulose (CHRONULAC) 10 GM/15ML solution Take 15 mLs (10 g total) by mouth 2 (two) times daily as needed for mild constipation. (Patient taking differently: Take 10 g by mouth 2 (two) times daily.) 236 mL 0   levothyroxine (SYNTHROID) 75 MCG tablet Take 75 mcg by mouth daily before breakfast.     melatonin 5 MG TABS Take 5 mg by mouth at bedtime.     metolazone (ZAROXOLYN) 2.5 MG tablet Take 2.5 mg by mouth daily as needed (weight gain > 3 lbs in 2 days with swelling.).     metoprolol succinate (TOPROL-XL) 25 MG 24 hr tablet Take 1 tablet (25 mg total) by mouth daily. 30 tablet 0   midodrine (PROAMATINE) 2.5 MG tablet Take 2 tablets (5 mg total) by mouth 3 (three) times daily as needed (hypotension (systolic blood pressure less than 100)).     Multiple Vitamins-Minerals (CEROVITE SENIOR) TABS Take 1 tablet by mouth daily.     NONFORMULARY OR COMPOUNDED ITEM Apply 1 Application topically See admin instructions. 1:1 clotrimazole 1% and hydrocortisone 1%. Apply twice daily to perineal region and abdominal folds.     ondansetron (ZOFRAN-ODT) 4 MG disintegrating tablet Take 4 mg by mouth 4 (four) times  daily as needed for nausea.     oxybutynin (DITROPAN) 5 MG tablet Take 5 mg by mouth 2 (two) times daily as needed (for overactive bladder).     phenazopyridine (PYRIDIUM) 100 MG tablet Take 100 mg by mouth every 8 (eight) hours as needed (dysuria).     Polyethyl Glycol-Propyl Glycol (SYSTANE) 0.4-0.3 % SOLN Place 1 drop into both eyes in the morning and at bedtime.     polyethylene glycol (MIRALAX / GLYCOLAX) 17 g packet Take 17 g by mouth daily.     potassium chloride SA (KLOR-CON) 20 MEQ tablet Take 2 tablets (40 mEq total) by mouth daily. (Patient  taking differently: Take 40 mEq by mouth 2 (two) times daily.) 180 tablet 3   Prucalopride Succinate (MOTEGRITY) 2 MG TABS Take 2 mg by mouth daily.     Psyllium (METAMUCIL PO) Take 1 packet by mouth 2 (two) times daily. Metamucil 2.4g/5.4g powder packets.     Salicylic Acid-Cleanser 6 % CREAM KIT Apply 1 each topically See admin instructions. Apply topically on Mon, Wed, Sat to lesion on foot     senna (SENOKOT) 8.6 MG TABS tablet Take 17.2 mg by mouth 2 (two) times daily.     sodium phosphate (FLEET) 7-19 GM/118ML ENEM Place 1 enema rectally daily as needed (constipation).     torsemide (DEMADEX) 20 MG tablet Take 1.5 tablets (30 mg total) by mouth 2 (two) times daily. 270 tablet 3   No current facility-administered medications for this visit.    LABS/IMAGING: No results found. However, due to the size of the patient record, not all encounters were searched. Please check Results Review for a complete set of results. No results found.  VITALS: BP 110/68 (BP Location: Left Arm, Patient Position: Sitting, Cuff Size: Large)   Pulse (!) 116   Ht 5\' 3"  (1.6 m)   Wt 196 lb 6.4 oz (89.1 kg)   SpO2 100%   BMI 34.79 kg/m   EXAM: General appearance: alert, no distress and moderately obese Neck: no carotid bruit, no JVD and thyroid not enlarged, symmetric, no tenderness/mass/nodules Lungs: clear to auscultation bilaterally Heart: irregularly irregular rhythm Abdomen: soft, non-tender; bowel sounds normal; no masses,  no organomegaly Extremities: edema Trace lower extremity Pulses: 2+ and symmetric Skin: Skin color, texture, turgor normal. No rashes or lesions Neurologic: Mental status: Alert, oriented, thought content appropriate Psych: Pleasant  EKG: EKG Interpretation Date/Time:  Tuesday June 19 2023 15:29:47 EDT Ventricular Rate:  116 PR Interval:    QRS Duration:  76 QT Interval:  334 QTC Calculation: 464 R Axis:   89  Text Interpretation: Atrial fibrillation with rapid  ventricular response Low voltage QRS Septal infarct , age undetermined When compared with ECG of 14-Jun-2023 04:05, Vent. rate has increased BY  39 BPM QRS duration has decreased Septal infarct is now Present Confirmed by Zoila Shutter [phone]) on 06/19/2023 3:40:52 PM    ASSESSMENT: Permanent atrial fibrillation, on Eliquis Morbid obesity Obstructive sleep apnea on BIPAP Multiple sclerosis Chronic venous insufficiency - +LGSV reflux Hypertension-controlled Noncardiac chest and left arm pain Non-ischemic cardiomyopathy - EF 50%, improved to 55% (07/2016) Chronic diastolic congestive heart failure Hyponatremia  PLAN: 1.   Toni [name] was recently hospitalized for hyponatremia likely secondary to UTI.  She was hydrated and her diuretics were held due to low blood pressure.  Subsequently her diuretics have been restarted.  She is getting midodrine scheduled 3 times a day although her blood pressure has remained normal.  Is not clear  if she continues to need it on a scheduled basis.  I thought that she could possibly take it as needed and her systolic blood pressures less than 90.  It may also be helpful for her to take her metoprolol at night.  This could help with her A-fib which is not rate controlled.  Ideally would like to have a heart rate consistently less than 100.  She may be at a higher dose.  Follow-up with me in 6 months or sooner as necessary.  Toni [name] Nose, MD, [facility], FACP    [facility]  Medical Director of the [facility] Diplomate of the American Board of Clinical Lipidology Attending Cardiologist  Direct Dial: [phone]  Fax: [phone]  Website:  [email or web address] Nicely Kalysta Kneisley 06/19/2023, 3:40 PM

## 2023-06-21 ENCOUNTER — Ambulatory Visit: Payer: Medicare Other | Admitting: Internal Medicine

## 2023-06-26 ENCOUNTER — Telehealth: Payer: Self-pay | Admitting: Internal Medicine

## 2023-06-26 NOTE — Telephone Encounter (Signed)
Please advise 

## 2023-06-26 NOTE — Telephone Encounter (Signed)
Pt c/o medication issue:  1. Name of Medication:  midodrine (PROAMATINE) 2.5 MG tablet   2. How are you currently taking this medication (dosage and times per day)?   3. Are you having a reaction (difficulty breathing--STAT)?   4. What is your medication issue?   Patient states she was previously told to take Midodrine when her BP drops below 100, but now she is being told to take it when it drops below 90. She would like to know why her parameters have changed. She mentions that she seems to feel better when she takes it when it drops below 100. Please advise.

## 2023-06-27 ENCOUNTER — Encounter: Payer: Self-pay | Admitting: Internal Medicine

## 2023-06-27 NOTE — Telephone Encounter (Signed)
MD sent MyChart message to patient with response

## 2023-07-02 MED ORDER — MIDODRINE HCL 2.5 MG PO TABS: 5.0000 mg | ORAL_TABLET | Freq: Three times a day (TID) | ORAL | 3 refills | Status: DC | PRN

## 2023-07-02 NOTE — Telephone Encounter (Signed)
Spoke to patient and faxed parameters for Midodrine  to [facility] @[phone]

## 2023-07-03 ENCOUNTER — Telehealth: Payer: Self-pay | Admitting: Neurology

## 2023-07-03 NOTE — Telephone Encounter (Signed)
Pt called stating that she thinks she is having a "MS attack"with burning and tightening sensation on her mouth and feet and she would like to be advised.

## 2023-07-04 NOTE — Telephone Encounter (Signed)
Called the patient back to get more information. States on [place] she developed tightness and burning in feet with loss of sensation. She states its not numb but that is feels like "fell asleep and is just waking up" She does have a catheter in place and a UA was taken and clear, denies signs of infection and fever. States that she took additional oral baclofen and felt some better but it has not gone away completely. She has been off the acthar for a extended amount of time due to insurance not covering.  In the past there was concern about steroids being used in emergency in exacerbation situations. Advised I would discuss with Dr Vickey Huger and Dr Epimenio Foot to get their thoughts on whether she could benefit with steroids and whether should not be given or if we should try acthar again through insurance.

## 2023-07-04 NOTE — Telephone Encounter (Signed)
Per Dr Epimenio Foot he states that it would be worth trying steroids to see if this helps prednsisone 50 mg #24   12 (600 mg) po qd x 2 days  Attempted to call the patient and there was no answer. Pt called back and I was able to advise of Dr Bonnita Hollow recommendation. She doesn't feel that she will be able to tolerate taking this high amount of steroids even if it is for 2 days. I advised that I could try and reach out to cardiology for her as well to get their input. Pt was appreciative.

## 2023-07-05 ENCOUNTER — Other Ambulatory Visit: Payer: Self-pay | Admitting: Neurology

## 2023-07-05 MED ORDER — PREDNISONE 50 MG PO TABS: 600.0000 mg | ORAL_TABLET | Freq: Every day | ORAL | 0 refills | Status: DC

## 2023-07-05 MED ORDER — PREDNISONE 50 MG PO TABS: 600.0000 mg | ORAL_TABLET | Freq: Every day | ORAL | 0 refills | Status: AC

## 2023-07-05 NOTE — Telephone Encounter (Addendum)
Dr Epimenio Foot stated pt is to take high dose for two days.  50 mg #24 12 (600 mg) po qd x 2 days. 12 tab each day for two days. Pt miss understood what Baird Lyons advised. She thought she informed her it would be 600mg  total in two days (300mg  each day). I clarified it for pt and ensured her we did discuss with Cardio which is the message [place] sent to her via [facility]. She verbally understood and was appreciative.

## 2023-07-05 NOTE — Addendum Note (Signed)
Addended by: Judi Cong on: 07/05/2023 08:24 AM   Modules accepted: Orders

## 2023-07-05 NOTE — Telephone Encounter (Signed)
Order faxed to [facility] [phone] confirmation received.

## 2023-07-05 NOTE — Telephone Encounter (Signed)
Pt is asking for a call as she is not completely clear on why the  predniSONE (DELTASONE) 50 MG tablet  was called in the way that it was, please call to explain.

## 2023-07-09 ENCOUNTER — Telehealth: Payer: Self-pay | Admitting: Neurology

## 2023-07-09 DIAGNOSIS — G4733 Obstructive sleep apnea (adult) (pediatric): Secondary | ICD-10-CM

## 2023-07-09 DIAGNOSIS — R0689 Other abnormalities of breathing: Secondary | ICD-10-CM

## 2023-07-09 DIAGNOSIS — G4731 Primary central sleep apnea: Secondary | ICD-10-CM

## 2023-07-09 DIAGNOSIS — G35 Multiple sclerosis: Secondary | ICD-10-CM

## 2023-07-09 DIAGNOSIS — Z9981 Dependence on supplemental oxygen: Secondary | ICD-10-CM

## 2023-07-09 NOTE — Telephone Encounter (Signed)
Pt said need a prescription for new BIPAP machine. There is something wrong with the one I have. [facility] said instead of trouble shooting just get a new one due to have had over 5 years. Would like a call back.

## 2023-07-09 NOTE — Telephone Encounter (Signed)
Order placed for the settings discussed in July office visit with Dr Vickey Huger. I will send the updated order to [facility]. Once she is set up with the new machine, will need to have a initial ASV set up appt scheduled 61-90 day from date of the new set up.

## 2023-07-09 NOTE — Telephone Encounter (Signed)
Returned pt's call, there was no answer. Phone line rang and rang, there was no voicemail available to leave a message.   **when pt's returns call please relay that the order for her new ASV machine has been placed. Also to give [name] a call to schedule her initial ASV, 61-90 days from the date of the new set up**

## 2023-07-30 ENCOUNTER — Ambulatory Visit (INDEPENDENT_AMBULATORY_CARE_PROVIDER_SITE_OTHER): Payer: Medicare Other | Admitting: Neurology

## 2023-07-30 ENCOUNTER — Encounter: Payer: Self-pay | Admitting: Neurology

## 2023-07-30 DIAGNOSIS — M62838 Other muscle spasm: Secondary | ICD-10-CM

## 2023-07-30 DIAGNOSIS — R252 Cramp and spasm: Secondary | ICD-10-CM | POA: Diagnosis not present

## 2023-07-30 DIAGNOSIS — G35 Multiple sclerosis: Secondary | ICD-10-CM | POA: Diagnosis not present

## 2023-07-30 DIAGNOSIS — Z978 Presence of other specified devices: Secondary | ICD-10-CM

## 2023-07-30 DIAGNOSIS — G8250 Quadriplegia, unspecified: Secondary | ICD-10-CM | POA: Diagnosis not present

## 2023-07-30 MED ORDER — BACLOFEN 40000 MCG/20ML IT SOLN: 80000.0000 ug | Freq: Once | INTRATHECAL | Status: AC

## 2023-07-30 NOTE — Progress Notes (Signed)
[facility]  PATIENT: Toni [name] DOB: October 24, 1945  REFERRING DOCTOR OR PCP: Dr. Vickey Huger SOURCE: Patient, notes from Dr. Vickey Huger and Dr. Anne Hahn  _________________________________   HISTORICAL  CHIEF COMPLAINT:  Chief Complaint  Patient presents with   Room 2    Pt is here with her Husband. Pt states she has been having to take some Baclofen. Pt states that she thinks her Baclofen needs to increase. Pt states that she had a episode recently.     HISTORY OF PRESENT ILLNESS:  Toni [name] is a 77 year old woman with multiple sclerosis.  Marland Kitchen    UPDATE 07/30/2023 She feels she is doing a little worse.  She has had more spasm/pain sensation in the trunk/abdomen.  She was prescribed a high-dose prednisone dose but opted not to take the medication.  She notes that she had been on ACTHar in the past and felt better when she was on it.  She has tolerated that better than she did the prednisone.  The spasticity in the legs is doing about the same she has no painful spasms but will occasionally have some increased tone with movements.  She has spasticity that is well-controlled with the baclofen pump.  She requires refills every 2 to 3 months.   She had her pump replaced in 2022 (Dr. Venetia Maxon).     She is in [facility] in [place].   Current pump is 40 cc.  She receives a benefit from the baclofen pump.  She is on third pump - first one 2009, last one July 2022).  She would like to increase the basal rate and I will increase it by 5%..  More recently we added a bolus at 10 PM..  She does take p.o. baclofen as needed when spasticity is a little worse and she has taken nightly over the past few weeks.  She is written for the 20 mg pills and I discussed that if that makes her sleepy she can take half of the tablet several times a day.    She is unable to walk but can assist some with transfers.  She uses a standing frame.  She is able to move her legs in bed and to go  on her side in bed.  She also notes weakness in her hands.  She has dysesthesias in her abdomen and legs.  Gabapentin is helping some..     She has difficulty with a neurogenic bladder and has frequent UTIs.  Spasticity is worse when she is sick.  Vision is doing well. She wasreently hospitalized for UTI (not sepsis).     REVIEW OF SYSTEMS: Constitutional: No fevers, chills, sweats, or change in appetite Eyes: No visual changes, double vision, eye pain Ear, nose and throat: No hearing loss, ear pain, nasal congestion, sore throat Cardiovascular: No chest pain, palpitations Respiratory:  No shortness of breath at rest or with exertion.   No wheezes GastrointestinaI: No nausea, vomiting, diarrhea, abdominal pain, fecal incontinence Genitourinary:  No dysuria, urinary retention or frequency.  No nocturia. Musculoskeletal:  No neck pain, back pain Integumentary: No rash, pruritus, skin lesions Neurological: as above Psychiatric: No depression at this time.  No anxiety Endocrine: No palpitations, diaphoresis, change in appetite, change in weigh or increased thirst Hematologic/Lymphatic:  No anemia, purpura, petechiae. Allergic/Immunologic: No itchy/runny eyes, nasal congestion, recent allergic reactions, rashes  ALLERGIES: Allergies  Allergen Reactions   Other Hives and Rash    Topical Surgical prep  - severe rash, hives  Hydrocodone Nausea And Vomiting   Oxycontin [Oxycodone] Nausea And Vomiting   Zoloft [Sertraline] Hives, Swelling and Rash   Chloraseptic [Phenol] Hives    Not documented on MAR   Macrobid [Nitrofurantoin] Nausea And Vomiting    Not documented on MAR   Vioxx [Rofecoxib] Other (See Comments)    Unknown reaction Not documented on MAR Can take NSAIDS   Hibiclens [Chlorhexidine Gluconate] Hives, Rash and Other (See Comments)    Sores    HOME MEDICATIONS:  Current Outpatient Medications:    acetaminophen (TYLENOL) 500 MG tablet, Take 1,000 mg by mouth every 8  (eight) hours as needed for fever or headache (pain)., Disp: , Rfl:    ALPRAZolam (XANAX) 0.5 MG tablet, Take 1 tablet (0.5 mg total) by mouth at bedtime. May also take 1 tablet every 12 hours as needed anxiety (Patient taking differently: Take 0.5 mg by mouth at bedtime.), Disp: 10 tablet, Rfl: 0   apixaban (ELIQUIS) 5 MG TABS tablet, Take 5 mg by mouth 2 (two) times daily., Disp: , Rfl:    baclofen (LIORESAL) 20 MG tablet, Take 20 mg by mouth daily as needed for muscle spasms., Disp: , Rfl:    Biotin 40981 MCG TABS, Take 10,000 mcg by mouth daily after lunch., Disp: , Rfl:    bisacodyl (DULCOLAX) 10 MG suppository, Place 10 mg rectally See admin instructions. Insert 1 suppository rectally  once a day on Sun, Tue, Thurs, Disp: , Rfl:    buPROPion (WELLBUTRIN XL) 150 MG 24 hr tablet, Take 1 tablet (150 mg total) by mouth daily., Disp: 90 tablet, Rfl: 1   Calcium Carb-Cholecalciferol (CALCIUM 500/D) 500-10 MG-MCG CHEW, Chew 1 tablet by mouth 2 (two) times daily., Disp: , Rfl:    cetirizine (ZYRTEC) 10 MG tablet, Take 10 mg by mouth daily as needed for allergies., Disp: , Rfl:    cholecalciferol 25 MCG (1000 UT) tablet, Take 2,000 Units by mouth 2 (two) times daily with breakfast and lunch., Disp: , Rfl:    corticotropin (ATHACAR H.P.) 80 UNIT/ML injectable gel, Give subcutaneous 80 units every other day for 10 days ( 5 shots ) a month., Disp: 5 mL, Rfl: 5   diphenhydrAMINE (BENADRYL) 25 mg capsule, Take 25 mg by mouth 3 (three) times daily as needed (minor itching, irritation)., Disp: , Rfl:    docusate sodium (COLACE) 100 MG capsule, Take 100 mg by mouth 2 (two) times daily., Disp: , Rfl:    Emollient (CERAVE DAILY MOISTURIZING) LOTN, Apply 1 application  topically daily. To groin, inguinal area, Disp: , Rfl:    famotidine (PEPCID) 20 MG tablet, Take 20 mg by mouth 2 (two) times daily., Disp: , Rfl:    fluticasone (FLONASE) 50 MCG/ACT nasal spray, Place 1 spray into both nostrils daily as needed (for  nasal congestion)., Disp: , Rfl:    gabapentin (NEURONTIN) 300 MG capsule, Toni [name] will take 300 mg of gabapentin at 6 AM, 10 AM, 2 PM and 6 PM and 600 mg at 10 PM by mouth. (Patient taking differently: Take 300 mg by mouth See admin instructions. 300 mg 5 times daily at 0600, 1000, 1400, 1800 and 300-600 mg at bedtime.), Disp: 180 capsule, Rfl: 11   ibuprofen (ADVIL) 400 MG tablet, Take 400 mg by mouth 3 (three) times daily., Disp: , Rfl:    lactulose (CHRONULAC) 10 GM/15ML solution, Take 15 mLs (10 g total) by mouth 2 (two) times daily as needed for mild constipation. (Patient taking differently: Take 10 g by  mouth 2 (two) times daily.), Disp: 236 mL, Rfl: 0   levothyroxine (SYNTHROID) 75 MCG tablet, Take 75 mcg by mouth daily before breakfast., Disp: , Rfl:    melatonin 5 MG TABS, Take 5 mg by mouth at bedtime., Disp: , Rfl:    metolazone (ZAROXOLYN) 2.5 MG tablet, Take 2.5 mg by mouth daily as needed (weight gain > 3 lbs in 2 days with swelling.)., Disp: , Rfl:    metoprolol succinate (TOPROL-XL) 25 MG 24 hr tablet, Take 1 tablet (25 mg total) by mouth daily., Disp: 30 tablet, Rfl: 0   midodrine (PROAMATINE) 2.5 MG tablet, Take 2 tablets (5 mg total) by mouth 3 (three) times daily as needed (hypotension (systolic blood pressure less than 100))., Disp: 180 tablet, Rfl: 3   Multiple Vitamins-Minerals (CEROVITE SENIOR) TABS, Take 1 tablet by mouth daily., Disp: , Rfl:    NONFORMULARY OR COMPOUNDED ITEM, Apply 1 Application topically See admin instructions. 1:1 clotrimazole 1% and hydrocortisone 1%. Apply twice daily to perineal region and abdominal folds., Disp: , Rfl:    ondansetron (ZOFRAN-ODT) 4 MG disintegrating tablet, Take 4 mg by mouth 4 (four) times daily as needed for nausea., Disp: , Rfl:    oxybutynin (DITROPAN) 5 MG tablet, Take 5 mg by mouth 2 (two) times daily as needed (for overactive bladder)., Disp: , Rfl:    phenazopyridine (PYRIDIUM) 100 MG tablet, Take 100 mg by mouth every 8  (eight) hours as needed (dysuria)., Disp: , Rfl:    Polyethyl Glycol-Propyl Glycol (SYSTANE) 0.4-0.3 % SOLN, Place 1 drop into both eyes in the morning and at bedtime., Disp: , Rfl:    polyethylene glycol (MIRALAX / GLYCOLAX) 17 g packet, Take 17 g by mouth daily., Disp: , Rfl:    potassium chloride SA (KLOR-CON) 20 MEQ tablet, Take 2 tablets (40 mEq total) by mouth daily. (Patient taking differently: Take 40 mEq by mouth 2 (two) times daily.), Disp: 180 tablet, Rfl: 3   Prucalopride Succinate (MOTEGRITY) 2 MG TABS, Take 2 mg by mouth daily., Disp: , Rfl:    Psyllium (METAMUCIL PO), Take 1 packet by mouth 2 (two) times daily. Metamucil 2.4g/5.4g powder packets., Disp: , Rfl:    Salicylic Acid-Cleanser 6 % CREAM KIT, Apply 1 each topically See admin instructions. Apply topically on Mon, Wed, Sat to lesion on foot, Disp: , Rfl:    senna (SENOKOT) 8.6 MG TABS tablet, Take 17.2 mg by mouth 2 (two) times daily., Disp: , Rfl:    sodium phosphate (FLEET) 7-19 GM/118ML ENEM, Place 1 enema rectally daily as needed (constipation)., Disp: , Rfl:    torsemide (DEMADEX) 20 MG tablet, Take 1.5 tablets (30 mg total) by mouth 2 (two) times daily., Disp: 270 tablet, Rfl: 3   ciprofloxacin (CIPRO) 500 MG tablet, Take 500 mg by mouth 2 (two) times daily. (Patient not taking: Reported on 06/19/2023), Disp: , Rfl:    corticotropin (ACTHAR) 80 UNIT/ML injectable gel, Inject 1 mL (80 Units total) into the skin See admin instructions for 10 days. Inject 80 units as needed (about 5 times a month) (Patient not taking: Reported on 06/19/2023), Disp: 5 mL, Rfl: 5  Current Facility-Administered Medications:    baclofen (GABLOFEN) intrathecal injection 40000 mcg/60ml, 80,000 mcg, Intrathecal, Once, Ngozi Alvidrez, Pearletha Furl, MD  PAST MEDICAL HISTORY: Past Medical History:  Diagnosis Date   Abnormality of gait 03/01/2015   Acute metabolic encephalopathy 06/04/2020   Anxiety    Arthritis    "knees" (01/19/2016)   Asthma  as a  child    Atrial fibrillation (HCC)    Back pain    Bilateral carpal tunnel syndrome 10/04/2020   Cancer (HCC)    / RENAL CELL CARCINOMA - GETS CT SCAN EVERY 6 MONTHS TO WATCH    CHF (congestive heart failure) (HCC)    Chronic pain    "nerve pain from the MS"   Complex atypical endometrial hyperplasia    Constipation    COVID-19 virus infection 08/23/2019   Depression    DVT (deep venous thrombosis) (HCC) 1983   "RLE; may have just been phlebitis; it was before the age of dopplers"   Dysrhythmia    afib    Edema    varicose veins with severe venous insuff in R and L GSV' ablation of R GSV 2012   GERD (gastroesophageal reflux disease)    HLD (hyperlipidemia)    Hypertension    Hypotension    Hypothyroidism    Joint pain    Multiple sclerosis (HCC)    has had this may 1989-[facility] reg doc   OSA on CPAP    bipap machine - SLEEP APNEA WORSE SINCE COVID IN 1/21 PER PATIENT SETTING HAS INCREASED FROM 9 TO 30    Osteoarthritis    PAF (paroxysmal atrial fibrillation) (HCC)    on coumadin; documented on monitor 06/2010 - no longer takes per [facility] 02/09/21   Pneumonia 11/2011   Sleep apnea    uses sleep apnea machine    PAST SURGICAL HISTORY: Past Surgical History:  Procedure Laterality Date   ANTERIOR CERVICAL DECOMP/DISCECTOMY FUSION  01/2004   CARDIAC CATHETERIZATION  06/22/2010   normal coronary arteries, PAF   CATARACT EXTRACTION, BILATERAL Bilateral    CHILD BIRTHS     X2   DILATATION & CURETTAGE/HYSTEROSCOPY WITH MYOSURE N/A 01/15/2020   Procedure: DILATATION & CURETTAGE/HYSTEROSCOPY WITH MYOSURE;  Surgeon: Richardean Chimera, MD;  Location: [facility];  Service: Gynecology;  Laterality: N/A;  pt in wheelchair-has MS   DILATION AND CURETTAGE OF UTERUS     DILATION AND CURETTAGE OF UTERUS N/A 02/16/2020   Procedure: DILATATION AND CURETTAGE;  Surgeon: Carver Fila, MD;  Location: [facility];  Service: Gynecology;  Laterality: N/A;  NOT GENERAL -NO INTUBATION   DNC      INFUSION PUMP IMPLANTATION  ~ 2009   baclofen infusion in lower abd   INTRATHECAL PUMP REVISION N/A 02/18/2021   Procedure: Baclofen Pump replacement with possible revision of catheter;  Surgeon: Maeola Harman, MD;  Location[facility];  Service: Neurosurgery;  Laterality: N/A;  3C/RM 20   INTRAUTERINE DEVICE (IUD) INSERTION N/A 02/16/2020   Procedure: INTRAUTERINE DEVICE (IUD) INSERTION - MIRENA;  Surgeon: Carver Fila, MD;  Location: [facility];  Service: Gynecology;  Laterality: N/A;   IUD put in uterus     PAIN PUMP REVISION N/A 07/29/2014   Procedure: Baclofen pump replacement;  Surgeon: Maeola Harman, MD;  Location: [facility];  Service: Neurosurgery;  Laterality: N/A;  Baclofen pump replacement   TONSILLECTOMY AND ADENOIDECTOMY     VARICOSE VEIN SURGERY  ~ 1968   VENOUS ABLATION  12/16/2010   radiofreq ablation -Dr Lynnea Ferrier and [facility]   WISDOM TOOTH EXTRACTION      FAMILY HISTORY: Family History  Problem Relation Age of Onset   Depression Mother    Sudden death Mother    Anxiety disorder Mother    Coronary artery disease Father        at age 90   Hyperlipidemia  Father    Thyroid disease Father    Coronary artery disease Maternal Grandmother    Depression Maternal Grandmother    Cancer Paternal Grandmother        breast   Alcoholism Son    Sleep apnea Neg Hx    Multiple sclerosis Neg Hx     SOCIAL HISTORY:  Social History   Socioeconomic History   Marital status: Married    Spouse name: [name]   Number of children: 2   Years of education: COLLEGE   Highest education level: Not on file  Occupational History   Occupation: RETIRED  Tobacco Use   Smoking status: Former    Current packs/day: 0.00    Average packs/day: 0.5 packs/day for 7.0 years (3.5 ttl pk-yrs)    Types: Cigarettes    Start date: 08/21/1969    Quit date: 08/21/1976    Years since quitting: 46.9   Smokeless tobacco: Never  Vaping Use   Vaping status: Never Used  Substance and Sexual Activity    Alcohol use: Not Currently   Drug use: No   Sexual activity: Not Currently    Birth control/protection: Post-menopausal  Other Topics Concern   Not on file  Social History Narrative   Lives in [place] with Husband Windy Fast [phone], [phone]   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   Right handed   Drinks a little caffeine in coffee sometimes    Social Determinants of Health   Financial Resource Strain: Not on file  Food Insecurity: No Food Insecurity (06/14/2023)   Hunger Vital Sign    Worried About Running Out of Food in the Last Year: Never true    Ran Out of Food in the Last Year: Never true  Transportation Needs: No Transportation Needs (06/14/2023)   PRAPARE - Administrator, Civil Service (Medical): No    Lack of Transportation (Non-Medical): No  Physical Activity: Not on file  Stress: Not on file  Social Connections: Not on file  Intimate Partner Violence: Not At Risk (06/14/2023)   Humiliation, Afraid, Rape, and Kick questionnaire    Fear of Current or Ex-Partner: No    Emotionally Abused: No    Physically Abused: No    Sexually Abused: No     PHYSICAL EXAM  There were no vitals filed for this visit.     There is no height or weight on file to calculate BMI.   General: The patient is well-developed and well-nourished and in no acute distress  HEENT:  Head is [place]/AT.    Neurologic Exam  Mental status: The patient is alert and oriented x 3 at the time of the examination. The patient has apparent normal recent and remote memory, with an apparently normal attention span and concentration ability.   .  Cranial nerves: Extraocular movements are full.  Facial strength was normal  Motor:  Muscle bulk reduced at the thenar eminences.  She has slightly increased muscle tone in the legs.  There were no phasic spasms.  She has reduced grip bilaterally and 3/5 strength in the intrinsic hand muscles.  Strength is 2+/5 in the left leg and 2/5 in the right leg.  Sensory:  Sensory testing is intact to touch x4.  Gait and station: She is unable to stand or walk.   Reflexes: Deep tendon reflexes are symmetric in the arms and absent in the legs.  No ankle clonus.    DIAGNOSTIC DATA (LABS, IMAGING, TESTING) - I reviewed patient records, labs, notes, testing and  imaging myself where available.  Lab Results  Component Value Date   WBC 7.0 06/15/2023   HGB 12.2 06/15/2023   HCT 35.7 (L) 06/15/2023   MCV 90.2 06/15/2023   PLT 248 06/15/2023      Component Value Date/Time   NA 131 (L) 06/15/2023 1407   NA 144 12/15/2020 1100   K 4.4 06/15/2023 1407   CL 89 (L) 06/15/2023 1407   CO2 34 (H) 06/15/2023 1407   GLUCOSE 134 (H) 06/15/2023 1407   BUN 13 06/15/2023 1407   BUN 27 12/15/2020 1100   CREATININE 0.56 06/15/2023 1407   CALCIUM 8.6 (L) 06/15/2023 1407   PROT 6.2 (L) 06/14/2023 0418   PROT 5.9 (L) 09/20/2020 1436   ALBUMIN 3.3 (L) 06/14/2023 0418   ALBUMIN 3.9 09/20/2020 1436   AST 19 06/14/2023 0418   ALT 10 06/14/2023 0418   ALKPHOS 60 06/14/2023 0418   BILITOT 0.6 06/14/2023 0418   BILITOT 0.3 09/20/2020 1436   GFRNONAA >60 06/15/2023 1407   GFRAA 90 09/20/2020 1436   Lab Results  Component Value Date   CHOL 111 03/19/2019   HDL 57 03/19/2019   LDLCALC 43 03/19/2019   TRIG 53 03/19/2019   CHOLHDL 3.0 04/22/2010   Lab Results  Component Value Date   HGBA1C 4.9 06/05/2020   No results found for: "VITAMINB12" Lab Results  Component Value Date   TSH 1.494 06/14/2023       ASSESSMENT AND PLAN  Spastic quadriplegia (HCC) - Plan: baclofen (GABLOFEN) intrathecal injection 40000 mcg/52ml  Spasticity - Plan: baclofen (GABLOFEN) intrathecal injection 40000 mcg/72ml  Presence of intrathecal pump - Plan: baclofen (GABLOFEN) intrathecal injection 40000 mcg/22ml  Muscle spasm of both lower legs - Plan: baclofen (GABLOFEN) intrathecal injection 40000 mcg/55ml   The baclofen pump was refilled with baclofen at 2000 mg per ml.   We  will increase the basal rate by 5%.  And have a bolus (approximately 40 mcg) additionally between 10 and 11 PM. She has noted more of a spasm sensation in the abdomen.  Consider adding lamotrigine and titrating to 50 mg twice daily.   She will return to see [name] in about 3 months for the next pump refill.  She should call us or Dr. Vickey Huger sooner for new or worsening symptoms..   F/u with Dr. Vickey Huger for other MS / neuro issues  This visit is part of a comprehensive longitudinal care medical relationship regarding the patients primary diagnosis of multiple sclerosis and spastic quadriplegia and related concerns.  Shanley Furlough A. Epimenio Foot, MD, [facility] 07/30/2023, 6:02 PM Certified in Neurology, Clinical Neurophysiology, Sleep Medicine and Neuroimaging  [facility] [address], [address] [place], [place] [address] [phone]

## 2023-07-30 NOTE — Progress Notes (Signed)
RM 2, with husband. Skin prepped with Betadine and draped in sterile fashion. Center port of implanted pump accessed using 22g huber needle.  Residual fluid measuring 10 ml was removed from the reservoir.  Gablofen 2051mcg/mlx2 instilled into pump. Pump reprogrammed to reflect a volume of 40ml.  ERI is 10/2027.   Next appt given for 10/08/23 at 2:30pm.  Next alarm date: 10/19/2023   Medication placed in pump: Gablofen 40,053mcg,20ml x2. Lot: [id number] Exp: 05/2025. NDC: [id number].

## 2023-08-10 ENCOUNTER — Ambulatory Visit: Payer: Medicare Other | Admitting: Gynecologic Oncology

## 2023-08-13 ENCOUNTER — Emergency Department (HOSPITAL_COMMUNITY): Payer: Medicare Other

## 2023-08-13 ENCOUNTER — Other Ambulatory Visit: Payer: Self-pay

## 2023-08-13 ENCOUNTER — Encounter (HOSPITAL_COMMUNITY): Payer: Self-pay | Admitting: Emergency Medicine

## 2023-08-13 ENCOUNTER — Inpatient Hospital Stay (HOSPITAL_COMMUNITY)
Admission: EM | Admit: 2023-08-13 | Discharge: 2023-08-20 | DRG: 698 | Disposition: A | Discharge status: Skilled Nursing Facility | Payer: Medicare Other | Source: Skilled Nursing Facility | Attending: Family Medicine | Admitting: Family Medicine

## 2023-08-13 DIAGNOSIS — G8250 Quadriplegia, unspecified: Secondary | ICD-10-CM | POA: Diagnosis present

## 2023-08-13 DIAGNOSIS — N319 Neuromuscular dysfunction of bladder, unspecified: Secondary | ICD-10-CM | POA: Diagnosis present

## 2023-08-13 DIAGNOSIS — Z87891 Personal history of nicotine dependence: Secondary | ICD-10-CM

## 2023-08-13 DIAGNOSIS — I5030 Unspecified diastolic (congestive) heart failure: Secondary | ICD-10-CM

## 2023-08-13 DIAGNOSIS — Z8349 Family history of other endocrine, nutritional and metabolic diseases: Secondary | ICD-10-CM

## 2023-08-13 DIAGNOSIS — Z85528 Personal history of other malignant neoplasm of kidney: Secondary | ICD-10-CM

## 2023-08-13 DIAGNOSIS — I5042 Chronic combined systolic (congestive) and diastolic (congestive) heart failure: Secondary | ICD-10-CM | POA: Diagnosis present

## 2023-08-13 DIAGNOSIS — G4733 Obstructive sleep apnea (adult) (pediatric): Secondary | ICD-10-CM | POA: Diagnosis present

## 2023-08-13 DIAGNOSIS — F32A Depression, unspecified: Secondary | ICD-10-CM | POA: Diagnosis present

## 2023-08-13 DIAGNOSIS — E662 Morbid (severe) obesity with alveolar hypoventilation: Secondary | ICD-10-CM | POA: Diagnosis present

## 2023-08-13 DIAGNOSIS — R1011 Right upper quadrant pain: Secondary | ICD-10-CM | POA: Diagnosis not present

## 2023-08-13 DIAGNOSIS — E871 Hypo-osmolality and hyponatremia: Secondary | ICD-10-CM | POA: Diagnosis not present

## 2023-08-13 DIAGNOSIS — Z881 Allergy status to other antibiotic agents status: Secondary | ICD-10-CM

## 2023-08-13 DIAGNOSIS — Z888 Allergy status to other drugs, medicaments and biological substances status: Secondary | ICD-10-CM

## 2023-08-13 DIAGNOSIS — K5909 Other constipation: Secondary | ICD-10-CM | POA: Diagnosis present

## 2023-08-13 DIAGNOSIS — E861 Hypovolemia: Secondary | ICD-10-CM | POA: Diagnosis present

## 2023-08-13 DIAGNOSIS — K567 Ileus, unspecified: Secondary | ICD-10-CM | POA: Diagnosis present

## 2023-08-13 DIAGNOSIS — Z79890 Hormone replacement therapy (postmenopausal): Secondary | ICD-10-CM

## 2023-08-13 DIAGNOSIS — I4821 Permanent atrial fibrillation: Secondary | ICD-10-CM | POA: Diagnosis not present

## 2023-08-13 DIAGNOSIS — Z993 Dependence on wheelchair: Secondary | ICD-10-CM

## 2023-08-13 DIAGNOSIS — N390 Urinary tract infection, site not specified: Secondary | ICD-10-CM | POA: Diagnosis present

## 2023-08-13 DIAGNOSIS — Z7901 Long term (current) use of anticoagulants: Secondary | ICD-10-CM

## 2023-08-13 DIAGNOSIS — G35 Multiple sclerosis: Secondary | ICD-10-CM | POA: Diagnosis present

## 2023-08-13 DIAGNOSIS — T83511A Infection and inflammatory reaction due to indwelling urethral catheter, initial encounter: Principal | ICD-10-CM | POA: Diagnosis present

## 2023-08-13 DIAGNOSIS — E785 Hyperlipidemia, unspecified: Secondary | ICD-10-CM | POA: Diagnosis present

## 2023-08-13 DIAGNOSIS — Z9989 Dependence on other enabling machines and devices: Secondary | ICD-10-CM

## 2023-08-13 DIAGNOSIS — F419 Anxiety disorder, unspecified: Secondary | ICD-10-CM | POA: Diagnosis present

## 2023-08-13 DIAGNOSIS — Z87440 Personal history of urinary (tract) infections: Secondary | ICD-10-CM

## 2023-08-13 DIAGNOSIS — Z6836 Body mass index (BMI) 36.0-36.9, adult: Secondary | ICD-10-CM

## 2023-08-13 DIAGNOSIS — E240 Pituitary-dependent Cushing's disease: Secondary | ICD-10-CM | POA: Diagnosis present

## 2023-08-13 DIAGNOSIS — E66812 Obesity, class 2: Secondary | ICD-10-CM | POA: Diagnosis present

## 2023-08-13 DIAGNOSIS — I509 Heart failure, unspecified: Secondary | ICD-10-CM

## 2023-08-13 DIAGNOSIS — K219 Gastro-esophageal reflux disease without esophagitis: Secondary | ICD-10-CM | POA: Diagnosis present

## 2023-08-13 DIAGNOSIS — G35B0 Primary progressive multiple sclerosis, unspecified: Secondary | ICD-10-CM | POA: Diagnosis present

## 2023-08-13 DIAGNOSIS — F418 Other specified anxiety disorders: Secondary | ICD-10-CM | POA: Diagnosis present

## 2023-08-13 DIAGNOSIS — Z818 Family history of other mental and behavioral disorders: Secondary | ICD-10-CM

## 2023-08-13 DIAGNOSIS — Z8249 Family history of ischemic heart disease and other diseases of the circulatory system: Secondary | ICD-10-CM

## 2023-08-13 DIAGNOSIS — I110 Hypertensive heart disease with heart failure: Secondary | ICD-10-CM | POA: Diagnosis present

## 2023-08-13 DIAGNOSIS — I5032 Chronic diastolic (congestive) heart failure: Secondary | ICD-10-CM | POA: Diagnosis present

## 2023-08-13 DIAGNOSIS — I9589 Other hypotension: Secondary | ICD-10-CM | POA: Diagnosis present

## 2023-08-13 DIAGNOSIS — N3289 Other specified disorders of bladder: Secondary | ICD-10-CM

## 2023-08-13 DIAGNOSIS — E860 Dehydration: Secondary | ICD-10-CM | POA: Diagnosis present

## 2023-08-13 DIAGNOSIS — Y846 Urinary catheterization as the cause of abnormal reaction of the patient, or of later complication, without mention of misadventure at the time of the procedure: Secondary | ICD-10-CM | POA: Diagnosis present

## 2023-08-13 DIAGNOSIS — E249 Cushing's syndrome, unspecified: Secondary | ICD-10-CM | POA: Diagnosis present

## 2023-08-13 DIAGNOSIS — E876 Hypokalemia: Secondary | ICD-10-CM | POA: Diagnosis not present

## 2023-08-13 DIAGNOSIS — R252 Cramp and spasm: Secondary | ICD-10-CM

## 2023-08-13 DIAGNOSIS — E039 Hypothyroidism, unspecified: Secondary | ICD-10-CM | POA: Diagnosis present

## 2023-08-13 DIAGNOSIS — Z79899 Other long term (current) drug therapy: Secondary | ICD-10-CM

## 2023-08-13 DIAGNOSIS — Z8616 Personal history of COVID-19: Secondary | ICD-10-CM

## 2023-08-13 DIAGNOSIS — Z978 Presence of other specified devices: Secondary | ICD-10-CM

## 2023-08-13 DIAGNOSIS — M1990 Unspecified osteoarthritis, unspecified site: Secondary | ICD-10-CM | POA: Diagnosis present

## 2023-08-13 DIAGNOSIS — G4731 Primary central sleep apnea: Secondary | ICD-10-CM

## 2023-08-13 DIAGNOSIS — I5022 Chronic systolic (congestive) heart failure: Secondary | ICD-10-CM | POA: Diagnosis present

## 2023-08-13 DIAGNOSIS — Z885 Allergy status to narcotic agent status: Secondary | ICD-10-CM

## 2023-08-13 LAB — URINALYSIS, ROUTINE W REFLEX MICROSCOPIC
Bilirubin Urine: NEGATIVE
Glucose, UA: NEGATIVE mg/dL
Ketones, ur: NEGATIVE mg/dL
Nitrite: NEGATIVE
Protein, ur: NEGATIVE mg/dL
Specific Gravity, Urine: 1.002 — ABNORMAL LOW (ref 1.005–1.030)
WBC, UA: >50 WBC/hpf (ref 0–5)
pH: 8 (ref 5.0–8.0)

## 2023-08-13 LAB — BASIC METABOLIC PANEL
Anion gap: 11 (ref 5–15)
Anion gap: 8 (ref 5–15)
BUN: 8 mg/dL (ref 8–23)
BUN: 8 mg/dL (ref 8–23)
CO2: 30 mmol/L (ref 22–32)
CO2: 31 mmol/L (ref 22–32)
Calcium: 8.4 mg/dL — ABNORMAL LOW (ref 8.9–10.3)
Calcium: 8 mg/dL — ABNORMAL LOW (ref 8.9–10.3)
Chloride: 91 mmol/L — ABNORMAL LOW (ref 98–111)
Chloride: 93 mmol/L — ABNORMAL LOW (ref 98–111)
Creatinine, Ser: 0.59 mg/dL (ref 0.44–1.00)
Creatinine, Ser: 0.55 mg/dL (ref 0.44–1.00)
GFR, Estimated: >60 mL/min (ref >60)
GFR, Estimated: >60 mL/min (ref >60)
Glucose, Bld: 154 mg/dL — ABNORMAL HIGH (ref 70–99)
Glucose, Bld: 124 mg/dL — ABNORMAL HIGH (ref 70–99)
Potassium: 3.1 mmol/L — ABNORMAL LOW (ref 3.5–5.1)
Potassium: 3.3 mmol/L — ABNORMAL LOW (ref 3.5–5.1)
Sodium: 132 mmol/L — ABNORMAL LOW (ref 135–145)
Sodium: 132 mmol/L — ABNORMAL LOW (ref 135–145)

## 2023-08-13 LAB — OSMOLALITY, URINE: Osmolality, Ur: 117 mosm/kg — ABNORMAL LOW (ref 300–900)

## 2023-08-13 LAB — CBC
HCT: 40.2 % (ref 36.0–46.0)
Hemoglobin: 13.8 g/dL (ref 12.0–15.0)
MCH: 31.3 pg (ref 26.0–34.0)
MCHC: 34.3 g/dL (ref 30.0–36.0)
MCV: 91.2 fL (ref 80.0–100.0)
Platelets: 231 10*3/uL (ref 150–400)
RBC: 4.41 MIL/uL (ref 3.87–5.11)
RDW: 11.9 % (ref 11.5–15.5)
WBC: 9.2 10*3/uL (ref 4.0–10.5)
nRBC: 0 % (ref 0.0–0.2)

## 2023-08-13 LAB — COMPREHENSIVE METABOLIC PANEL
ALT: 11 U/L (ref 0–44)
AST: 14 U/L — ABNORMAL LOW (ref 15–41)
Albumin: 3.4 g/dL — ABNORMAL LOW (ref 3.5–5.0)
Alkaline Phosphatase: 64 U/L (ref 38–126)
Anion gap: 9 (ref 5–15)
BUN: 10 mg/dL (ref 8–23)
CO2: 34 mmol/L — ABNORMAL HIGH (ref 22–32)
Calcium: 8.8 mg/dL — ABNORMAL LOW (ref 8.9–10.3)
Chloride: 81 mmol/L — ABNORMAL LOW (ref 98–111)
Creatinine, Ser: 0.7 mg/dL (ref 0.44–1.00)
GFR, Estimated: >60 mL/min (ref >60)
Glucose, Bld: 95 mg/dL (ref 70–99)
Potassium: 3.9 mmol/L (ref 3.5–5.1)
Sodium: 124 mmol/L — ABNORMAL LOW (ref 135–145)
Total Bilirubin: 0.6 mg/dL (ref <1.2)
Total Protein: 6.5 g/dL (ref 6.5–8.1)

## 2023-08-13 LAB — OSMOLALITY: Osmolality: 284 mosm/kg (ref 275–295)

## 2023-08-13 LAB — TROPONIN I (HIGH SENSITIVITY)
Troponin I (High Sensitivity): 10 ng/L (ref <18)
Troponin I (High Sensitivity): 7 ng/L (ref <18)

## 2023-08-13 LAB — LIPASE, BLOOD: Lipase: 22 U/L (ref 11–51)

## 2023-08-13 LAB — CORTISOL-AM, BLOOD: Cortisol - AM: 16.9 ug/dL (ref 6.7–22.6)

## 2023-08-13 MED ORDER — FENTANYL CITRATE PF 50 MCG/ML IJ SOSY
50.0000 ug | PREFILLED_SYRINGE | Freq: Once | INTRAMUSCULAR | Status: AC
  Administered 2023-08-13: 50 ug via INTRAVENOUS
  Filled 2023-08-13: qty 1

## 2023-08-13 MED ORDER — FLEET ENEMA RE ENEM
1.0000 | ENEMA | Freq: Every day | RECTAL | Status: DC | PRN
  Administered 2023-08-14: 1 via RECTAL
  Filled 2023-08-13 (×2): qty 1

## 2023-08-13 MED ORDER — SODIUM CHLORIDE 0.9 % IV SOLN
1.0000 g | INTRAVENOUS | Status: AC
  Administered 2023-08-14 – 2023-08-17 (×4): 1 g via INTRAVENOUS
  Filled 2023-08-13 (×4): qty 10

## 2023-08-13 MED ORDER — APIXABAN 5 MG PO TABS
5.0000 mg | ORAL_TABLET | Freq: Two times a day (BID) | ORAL | Status: DC
  Administered 2023-08-13 – 2023-08-20 (×14): 5 mg via ORAL
  Filled 2023-08-13 (×14): qty 1

## 2023-08-13 MED ORDER — OXYBUTYNIN CHLORIDE 5 MG PO TABS
5.0000 mg | ORAL_TABLET | Freq: Two times a day (BID) | ORAL | Status: DC | PRN
Start: 2023-08-13 — End: 2023-08-20

## 2023-08-13 MED ORDER — ACETAMINOPHEN 650 MG RE SUPP: 650.0000 mg | Freq: Four times a day (QID) | RECTAL | Status: DC | PRN

## 2023-08-13 MED ORDER — SODIUM CHLORIDE 0.9 % IV BOLUS
1000.0000 mL | Freq: Once | INTRAVENOUS | Status: AC
  Administered 2023-08-13: 1000 mL via INTRAVENOUS

## 2023-08-13 MED ORDER — ACETAMINOPHEN 325 MG PO TABS
650.0000 mg | ORAL_TABLET | Freq: Four times a day (QID) | ORAL | Status: DC | PRN
  Administered 2023-08-18: 650 mg via ORAL
  Filled 2023-08-13: qty 2

## 2023-08-13 MED ORDER — PHENAZOPYRIDINE HCL 100 MG PO TABS
100.0000 mg | ORAL_TABLET | Freq: Three times a day (TID) | ORAL | Status: DC | PRN
Start: 2023-08-13 — End: 2023-08-20

## 2023-08-13 MED ORDER — MELATONIN 5 MG PO TABS
5.0000 mg | ORAL_TABLET | Freq: Every day | ORAL | Status: DC
  Administered 2023-08-13 – 2023-08-19 (×7): 5 mg via ORAL
  Filled 2023-08-13 (×7): qty 1

## 2023-08-13 MED ORDER — IOHEXOL 350 MG/ML SOLN
75.0000 mL | Freq: Once | INTRAVENOUS | Status: AC | PRN
  Administered 2023-08-13: 75 mL via INTRAVENOUS

## 2023-08-13 MED ORDER — SODIUM CHLORIDE 0.9% FLUSH
3.0000 mL | Freq: Two times a day (BID) | INTRAVENOUS | Status: DC
  Administered 2023-08-13 – 2023-08-20 (×13): 3 mL via INTRAVENOUS

## 2023-08-13 MED ORDER — DIPHENHYDRAMINE HCL 25 MG PO CAPS: 25.0000 mg | ORAL_CAPSULE | Freq: Three times a day (TID) | ORAL | Status: DC | PRN

## 2023-08-13 MED ORDER — SODIUM CHLORIDE 0.9 % IV SOLN: INTRAVENOUS | Status: DC

## 2023-08-13 MED ORDER — POLYVINYL ALCOHOL 1.4 % OP SOLN
1.0000 [drp] | Freq: Two times a day (BID) | OPHTHALMIC | Status: DC
  Administered 2023-08-13 – 2023-08-20 (×12): 1 [drp] via OPHTHALMIC
  Filled 2023-08-13: qty 15

## 2023-08-13 MED ORDER — POLYETHYLENE GLYCOL 3350 17 G PO PACK
17.0000 g | PACK | Freq: Every day | ORAL | Status: DC
Start: 2023-08-13 — End: 2023-08-20
  Administered 2023-08-14 – 2023-08-20 (×5): 17 g via ORAL
  Filled 2023-08-13 (×7): qty 1

## 2023-08-13 MED ORDER — BUPROPION HCL ER (XL) 150 MG PO TB24
150.0000 mg | ORAL_TABLET | Freq: Every day | ORAL | Status: DC
  Administered 2023-08-14 – 2023-08-20 (×7): 150 mg via ORAL
  Filled 2023-08-13 (×7): qty 1

## 2023-08-13 MED ORDER — LEVOTHYROXINE SODIUM 75 MCG PO TABS
75.0000 ug | ORAL_TABLET | Freq: Every day | ORAL | Status: DC
  Administered 2023-08-14 – 2023-08-20 (×7): 75 ug via ORAL
  Filled 2023-08-13 (×6): qty 1

## 2023-08-13 MED ORDER — LACTULOSE 10 GM/15ML PO SOLN
10.0000 g | Freq: Two times a day (BID) | ORAL | Status: DC
  Administered 2023-08-13 – 2023-08-20 (×12): 10 g via ORAL
  Filled 2023-08-13 (×13): qty 15

## 2023-08-13 MED ORDER — ONDANSETRON HCL 4 MG/2ML IJ SOLN
4.0000 mg | Freq: Once | INTRAMUSCULAR | Status: AC
  Administered 2023-08-13: 4 mg via INTRAVENOUS
  Filled 2023-08-13: qty 2

## 2023-08-13 MED ORDER — SODIUM CHLORIDE 0.9 % IV SOLN
1.0000 g | Freq: Once | INTRAVENOUS | Status: AC
  Administered 2023-08-13: 1 g via INTRAVENOUS
  Filled 2023-08-13: qty 10

## 2023-08-13 MED ORDER — HYDROCERIN EX CREA
1.0000 | TOPICAL_CREAM | Freq: Every day | CUTANEOUS | Status: DC
  Administered 2023-08-15 – 2023-08-19 (×5): 1 via TOPICAL
  Filled 2023-08-13: qty 113

## 2023-08-13 MED ORDER — PSYLLIUM 95 % PO PACK
1.0000 | PACK | Freq: Two times a day (BID) | ORAL | Status: DC
  Administered 2023-08-13 – 2023-08-20 (×12): 1 via ORAL
  Filled 2023-08-13 (×15): qty 1

## 2023-08-13 MED ORDER — MIDODRINE HCL 5 MG PO TABS
5.0000 mg | ORAL_TABLET | Freq: Three times a day (TID) | ORAL | Status: DC | PRN
  Administered 2023-08-13 – 2023-08-19 (×8): 5 mg via ORAL
  Filled 2023-08-13 (×9): qty 1

## 2023-08-13 MED ORDER — ALPRAZOLAM 0.5 MG PO TABS
0.5000 mg | ORAL_TABLET | Freq: Every day | ORAL | Status: DC
  Administered 2023-08-13 – 2023-08-19 (×7): 0.5 mg via ORAL
  Filled 2023-08-13 (×7): qty 1

## 2023-08-13 NOTE — ED Notes (Signed)
ED TO INPATIENT HANDOFF REPORT  ED Nurse Name and Phone #: Theophilus Bones [phone]  S Name/Age/Gender Toni [name] 77 y.o. female Room/Bed: 039C/039C  Code Status   Code Status: Full Code  Home/SNF/Other Home Patient oriented to: self, place, time, and situation Is this baseline? Yes   Triage Complete: Triage complete  Chief Complaint Hyponatremia [E87.1]  Triage Note Abd pain x 1 week, last Tuesday had xray that showed constipation, enema was given and laxatives. Started to have soft Bms Tuesday.  LUQ pain continues, vomited 2 times yesterday. Denies any blood in stool or vomit. Unable to eat due to increased pain and nausea in LUQ. Zofran has been given this week for nausea with no relief.   Allergies Allergies  Allergen Reactions   Other Hives and Rash    Topical Surgical prep  - severe rash, hives   Hydrocodone Nausea And Vomiting   Oxycontin [Oxycodone] Nausea And Vomiting   Zoloft [Sertraline] Hives, Swelling and Rash   Chloraseptic [Phenol] Hives    Not documented on MAR   Macrobid [Nitrofurantoin] Nausea And Vomiting    Not documented on MAR   Vioxx [Rofecoxib] Other (See Comments)    Unknown reaction Not documented on MAR Can take NSAIDS   Hibiclens [Chlorhexidine Gluconate] Hives, Rash and Other (See Comments)    Sores    Level of Care/Admitting Diagnosis ED Disposition     ED Disposition  Admit   Condition  --   Comment  Hospital Area: [facility] [[id number]]  Level of Care: Progressive [102]  Admit to Progressive based on following criteria: GI, ENDOCRINE disease patients with GI bleeding, acute liver failure or pancreatitis, stable with diabetic ketoacidosis or thyrotoxicosis (hypothyroid) state.  Admit to Progressive based on following criteria: NEPHROLOGY stable condition requiring close monitoring for AKI, requiring Hemodialysis or Peritoneal Dialysis either from expected electrolyte imbalance, acidosis, or fluid overload that can  be managed by NIPPV or high flow oxygen.  May place patient in observation at [facility] or [facility] if equivalent level of care is available:: No  Covid Evaluation: Asymptomatic - no recent exposure (last 10 days) testing not required  Diagnosis: Hyponatremia [198519]  Admitting Physician: Synetta Fail [[id number]]  Attending Physician: Synetta Fail [[id number]]          B Medical/Surgery History Past Medical History:  Diagnosis Date   Abnormality of gait 03/01/2015   Acute metabolic encephalopathy 06/04/2020   Anxiety    Arthritis    "knees" (01/19/2016)   Asthma    as a child    Atrial fibrillation (HCC)    Back pain    Bilateral carpal tunnel syndrome 10/04/2020   Cancer (HCC)    / RENAL CELL CARCINOMA - GETS CT SCAN EVERY 6 MONTHS TO WATCH    CHF (congestive heart failure) (HCC)    Chronic pain    "nerve pain from the MS"   Complex atypical endometrial hyperplasia    Constipation    COVID-19 long hauler manifesting chronic dyspnea 09/13/2020   COVID-19 virus infection 08/23/2019   Depression    DVT (deep venous thrombosis) (HCC) 1983   "RLE; may have just been phlebitis; it was before the age of dopplers"   Dysrhythmia    afib    Edema    varicose veins with severe venous insuff in R and L GSV' ablation of R GSV 2012   GERD (gastroesophageal reflux disease)    HLD (hyperlipidemia)    Hypertension  Hypotension    Hypothyroidism    Joint pain    Multiple sclerosis (HCC)    has had this may 1989-[facility] reg doc   OSA on CPAP    bipap machine - SLEEP APNEA WORSE SINCE COVID IN 1/21 PER PATIENT SETTING HAS INCREASED FROM 9 TO 30    Osteoarthritis    PAF (paroxysmal atrial fibrillation) (HCC)    on coumadin; documented on monitor 06/2010 - no longer takes per Renne Crigler 02/09/21   Pneumonia 11/2011   Recurrent UTI 04/07/2020   Sepsis (HCC) 06/04/2020   Sepsis secondary to UTI (HCC) 11/20/2022   Sleep apnea    uses sleep apnea machine   Past  Surgical History:  Procedure Laterality Date   ANTERIOR CERVICAL DECOMP/DISCECTOMY FUSION  01/2004   CARDIAC CATHETERIZATION  06/22/2010   normal coronary arteries, PAF   CATARACT EXTRACTION, BILATERAL Bilateral    CHILD BIRTHS     X2   DILATATION & CURETTAGE/HYSTEROSCOPY WITH MYOSURE N/A 01/15/2020   Procedure: DILATATION & CURETTAGE/HYSTEROSCOPY WITH MYOSURE;  Surgeon: Richardean Chimera, MD;  Location: [facility];  Service: Gynecology;  Laterality: N/A;  pt in wheelchair-has MS   DILATION AND CURETTAGE OF UTERUS     DILATION AND CURETTAGE OF UTERUS N/A 02/16/2020   Procedure: DILATATION AND CURETTAGE;  Surgeon: Carver Fila, MD;  Location: [facility];  Service: Gynecology;  Laterality: N/A;  NOT GENERAL -NO INTUBATION   DNC     INFUSION PUMP IMPLANTATION  ~ 2009   baclofen infusion in lower abd   INTRATHECAL PUMP REVISION N/A 02/18/2021   Procedure: Baclofen Pump replacement with possible revision of catheter;  Surgeon: Maeola Harman, MD;  Location: [facility];  Service: Neurosurgery;  Laterality: N/A;  3C/RM 20   INTRAUTERINE DEVICE (IUD) INSERTION N/A 02/16/2020   Procedure: INTRAUTERINE DEVICE (IUD) INSERTION - MIRENA;  Surgeon: Carver Fila, MD;  Location: [facility];  Service: Gynecology;  Laterality: N/A;   IUD put in uterus     PAIN PUMP REVISION N/A 07/29/2014   Procedure: Baclofen pump replacement;  Surgeon: Maeola Harman, MD;  Location: [facility];  Service: Neurosurgery;  Laterality: N/A;  Baclofen pump replacement   TONSILLECTOMY AND ADENOIDECTOMY     VARICOSE VEIN SURGERY  ~ 1968   VENOUS ABLATION  12/16/2010   radiofreq ablation -Dr Lynnea Ferrier and [facility]   WISDOM TOOTH EXTRACTION       A IV Location/Drains/Wounds Patient Lines/Drains/Airways Status     Active Line/Drains/Airways     Name Placement date Placement time Site Days   Peripheral IV 08/13/23 18 G Anterior;Distal;Right Forearm 08/13/23  1450  Forearm  less than 1   Pressure Injury 11/20/22 Sacrum Left;Lower Stage 1  -  Intact skin with non-blanchable redness of a localized area usually over a bony prominence. 11/20/22  1845  -- 266   Pressure Injury 11/20/22 Buttocks Right;Lower Stage 2 -  Partial thickness loss of dermis presenting as a shallow open injury with a red, pink wound bed without slough. 11/20/22  1845  -- 266   Pressure Injury 06/14/23 Nose Mid;Upper pressure injury, red/pink 06/14/23  1049  -- 60   Pressure Injury 06/14/23 Coccyx Mid;Left;Right open, red 06/14/23  1049  -- 60   Wound / Incision (Open or Dehisced) 06/14/23 Foot Anterior;Left wound, not open, hard to touch 06/14/23  1049  Foot  60            Intake/Output Last 24 hours  Intake/Output Summary (Last 24 hours) at 08/13/2023  1719 Last data filed at 08/13/2023 1656 Gross per 24 hour  Intake 102.18 ml  Output --  Net 102.18 ml    Labs/Imaging Results for orders placed or performed during the hospital encounter of 08/13/23 (from the past 48 hours)  Lipase, blood     Status: None   Collection Time: 08/13/23  1:16 PM  Result Value Ref Range   Lipase 22 11 - 51 U/L    Comment: Performed at [facility], [address], [place], [place] [address]  Comprehensive metabolic panel     Status: Abnormal   Collection Time: 08/13/23  1:16 PM  Result Value Ref Range   Sodium 124 (L) 135 - 145 mmol/L   Potassium 3.9 3.5 - 5.1 mmol/L   Chloride 81 (L) 98 - 111 mmol/L   CO2 34 (H) 22 - 32 mmol/L   Glucose, Bld 95 70 - 99 mg/dL    Comment: Glucose reference range applies only to samples taken after fasting for at least 8 hours.   BUN 10 8 - 23 mg/dL   Creatinine, Ser 3.08 0.44 - 1.00 mg/dL   Calcium 8.8 (L) 8.9 - 10.3 mg/dL   Total Protein 6.5 6.5 - 8.1 g/dL   Albumin 3.4 (L) 3.5 - 5.0 g/dL   AST 14 (L) 15 - 41 U/L   ALT 11 0 - 44 U/L   Alkaline Phosphatase 64 38 - 126 U/L   Total Bilirubin 0.6 <1.2 mg/dL   GFR, Estimated >65 >78 mL/min    Comment: (NOTE) Calculated using the CKD-EPI Creatinine Equation (2021)     Anion gap 9 5 - 15    Comment: Performed at [facility], [address] [place], [place] [address]  CBC     Status: None   Collection Time: 08/13/23  1:16 PM  Result Value Ref Range   WBC 9.2 4.0 - 10.5 K/uL   RBC 4.41 3.87 - 5.11 MIL/uL   Hemoglobin 13.8 12.0 - 15.0 g/dL   HCT 95.2 84.1 - 32.4 %   MCV 91.2 80.0 - 100.0 fL   MCH 31.3 26.0 - 34.0 pg   MCHC 34.3 30.0 - 36.0 g/dL   RDW 40.1 02.7 - 25.3 %   Platelets 231 150 - 400 K/uL   nRBC 0.0 0.0 - 0.2 %    Comment: Performed at [facility], [address] [place], [place] [address]  Urinalysis, Routine w reflex microscopic -Urine, Clean Catch     Status: Abnormal   Collection Time: 08/13/23  1:16 PM  Result Value Ref Range   Color, Urine YELLOW YELLOW   APPearance CLOUDY (A) CLEAR   Specific Gravity, Urine 1.002 (L) 1.005 - 1.030   pH 8.0 5.0 - 8.0   Glucose, UA NEGATIVE NEGATIVE mg/dL   Hgb urine dipstick MODERATE (A) NEGATIVE   Bilirubin Urine NEGATIVE NEGATIVE   Ketones, ur NEGATIVE NEGATIVE mg/dL   Protein, ur NEGATIVE NEGATIVE mg/dL   Nitrite NEGATIVE NEGATIVE   Leukocytes,Ua LARGE (A) NEGATIVE   RBC / HPF 21-50 0 - 5 RBC/hpf   WBC, UA >50 0 - 5 WBC/hpf   Bacteria, UA MANY (A) NONE SEEN   Squamous Epithelial / HPF 0-5 0 - 5 /HPF    Comment: Performed at [facility], [address] [place], [place] [address]  Troponin I (High Sensitivity)     Status: None   Collection Time: 08/13/23  1:32 PM  Result Value Ref Range  Troponin I (High Sensitivity) 10 <18 ng/L    Comment: (NOTE) Elevated high sensitivity troponin I (hsTnI) values and significant  changes across serial measurements may suggest ACS but many other  chronic and acute conditions are known to elevate hsTnI results.  Refer to the "Links" section for chest pain algorithms and additional  guidance. Performed at [facility], [address] [place], [place] [address]   Osmolality, urine     Status: Abnormal   Collection Time:  08/13/23  1:32 PM  Result Value Ref Range   Osmolality, Ur 117 (L) 300 - 900 mOsm/kg    Comment: REPEATED TO VERIFY Performed at [facility], [address], [place], [place] [address]    *Note: Due to a large number of results and/or encounters for the requested time period, some results have not been displayed. A complete set of results can be found in Results Review.   CT ABDOMEN PELVIS W CONTRAST Result Date: 08/13/2023 CLINICAL DATA:  Left lower quadrant abdominal pain * Tracking Code: BO * EXAM: CT ABDOMEN AND PELVIS WITH CONTRAST TECHNIQUE: Multidetector CT imaging of the abdomen and pelvis was performed using the standard protocol following bolus administration of intravenous contrast. RADIATION DOSE REDUCTION: This exam was performed according to the departmental dose-optimization program which includes automated exposure control, adjustment of the mA and/or kV according to patient size and/or use of iterative reconstruction technique. CONTRAST:  75mL OMNIPAQUE IOHEXOL 350 MG/ML SOLN COMPARISON:  07/13/2022 FINDINGS: Lower chest: No acute abnormality.  Cardiomegaly. Hepatobiliary: No solid liver abnormality is seen. Mildly distended gallbladder. No visible gallstones, gallbladder wall thickening, or biliary dilatation. Pancreas: Unremarkable. No pancreatic ductal dilatation or surrounding inflammatory changes. Spleen: Normal in size without significant abnormality. Adrenals/Urinary Tract: Adrenal glands are unremarkable. Unchanged small enhancing mass arising from the superior pole of the left kidney measuring 1.4 x 1.1 cm (series 6, image 55). Foley catheter in the bladder. Catheter tip appears to be within the bladder wall with adjacent bladder wall thickening and fat stranding (series 6, image 56). Stomach/Bowel: Stomach is within normal limits. Appendix appears normal. No evidence of bowel wall thickening, distention, or inflammatory changes. Large burden of stool throughout the colon  and rectum. Vascular/Lymphatic: No significant vascular findings are present. No enlarged abdominal or pelvic lymph nodes. Reproductive: No mass or other significant abnormality. IUD present in the fundal endometrial cavity. Other: No abdominal wall hernia or abnormality. No ascites. Musculoskeletal: No acute or significant osseous findings. IMPRESSION: 1. Foley catheter in the bladder. Catheter tip appears to be within the bladder wall with adjacent bladder wall thickening and fat stranding. 2. Unchanged small enhancing mass arising from the superior pole of the left kidney measuring 1.4 x 1.1 cm, consistent with small renal cell carcinoma. 3. Large burden of stool throughout the colon and rectum. Electronically Signed   By: Jearld Lesch M.D.   On: 08/13/2023 16:02    Pending Labs Unresulted Labs (From admission, onward)     Start     Ordered   08/14/23 0500  CBC  Tomorrow morning,   R        08/13/23 1655   08/13/23 1655  Basic metabolic panel  Now then every 6 hours,   R (with TIMED occurrences)      08/13/23 1655   08/13/23 1655  Sodium, urine, random  Add-on,   AD        08/13/23 1655   08/13/23 1655  Cortisol-am, blood  Once,  R        08/13/23 1655   08/13/23 1545  Osmolality  Once,   R        08/13/23 1545   08/13/23 1524  Urine Culture  Once,   URGENT       Question:  Indication  Answer:  Sepsis   08/13/23 1523            Vitals/Pain Today's Vitals   08/13/23 1437 08/13/23 1447 08/13/23 1646 08/13/23 1650  BP:  91/70  (!) 99/59  Pulse:  68  76  Resp:  18  18  Temp:      TempSrc:      SpO2:  100%  99%  Weight:      Height:      PainSc: 6   4      Isolation Precautions No active isolations  Medications Medications  cefTRIAXone (ROCEPHIN) 1 g in sodium chloride 0.9 % 100 mL IVPB (has no administration in time range)  midodrine (PROAMATINE) tablet 5 mg (has no administration in time range)  ALPRAZolam (XANAX) tablet 0.5 mg (has no administration in time range)   buPROPion (WELLBUTRIN XL) 24 hr tablet 150 mg (has no administration in time range)  levothyroxine (SYNTHROID) tablet 75 mcg (has no administration in time range)  lactulose (CHRONULAC) 10 GM/15ML solution 10 g (has no administration in time range)  polyethylene glycol (MIRALAX / GLYCOLAX) packet 17 g (has no administration in time range)  psyllium (HYDROCIL/METAMUCIL) 1 packet (has no administration in time range)  sodium phosphate (FLEET) 7-19 GM/118ML enema 1 enema (has no administration in time range)  oxybutynin (DITROPAN) tablet 5 mg (has no administration in time range)  phenazopyridine (PYRIDIUM) tablet 100 mg (has no administration in time range)  apixaban (ELIQUIS) tablet 5 mg (has no administration in time range)  melatonin tablet 5 mg (has no administration in time range)  diphenhydrAMINE (BENADRYL) capsule 25 mg (has no administration in time range)  Polyethyl Glycol-Propyl Glycol 0.4-0.3 % SOLN 1 drop (has no administration in time range)  CeraVe Daily Moisturizing LOTN 1 application  (has no administration in time range)  sodium chloride flush (NS) 0.9 % injection 3 mL (has no administration in time range)  acetaminophen (TYLENOL) tablet 650 mg (has no administration in time range)    Or  acetaminophen (TYLENOL) suppository 650 mg (has no administration in time range)  0.9 %  sodium chloride infusion (has no administration in time range)  sodium chloride 0.9 % bolus 1,000 mL (1,000 mLs Intravenous New Bag/Given 08/13/23 1553)  iohexol (OMNIPAQUE) 350 MG/ML injection 75 mL (75 mLs Intravenous Contrast Given 08/13/23 1513)  cefTRIAXone (ROCEPHIN) 1 g in sodium chloride 0.9 % 100 mL IVPB (0 g Intravenous Stopped 08/13/23 1656)  ondansetron (ZOFRAN) injection 4 mg (4 mg Intravenous Given 08/13/23 1544)  fentaNYL (SUBLIMAZE) injection 50 mcg (50 mcg Intravenous Given 08/13/23 1601)    Mobility walks     Focused Assessments Gastro   R Recommendations: See Admitting  Provider Note  Report given to:   Additional Notes:

## 2023-08-13 NOTE — ED Triage Notes (Signed)
Abd pain x 1 week, last Tuesday had xray that showed constipation, enema was given and laxatives. Started to have soft Bms Tuesday.  LUQ pain continues, vomited 2 times yesterday. Denies any blood in stool or vomit. Unable to eat due to increased pain and nausea in LUQ. Zofran has been given this week for nausea with no relief.

## 2023-08-13 NOTE — H&P (Addendum)
History and Physical   Toni [name] ZOX:[id number] DOB: 18-Jan-1946 DOA: 08/13/2023  PCP: Sherron Monday, MD   Patient coming from: Home/facility  Chief Complaint: Weakness, abdominal pain, recent constipation, vomiting  HPI: Toni [name] is a 77 y.o. 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  Patient reporting about a week of abdominal distention and discomfort at her facility.  Imaging done showed constipation and possible ileus.  Patient was started on a liquid diet and given an enema.  She subsequently began to have significant cramping and nausea vomiting.  She is additionally reporting leaking around her Foley.  She denies fevers, chills, chest pain, shortness of breath.  ED Course: Vital signs in the ED notable for blood pressure in the 90s to 100 systolic.  Lab workup included CMP with sodium 124, chloride 81, bicarb 34, calcium 8.8, albumin 3.4.  CBC within normal limits.  Troponin negative with repeat pending.  Lipase normal.  Urinalysis with hemoglobin, leukocytes, bacteria with urine culture pending.  Urine osmolality 117.  Serum osmolality pending.  CT of the abdomen pelvis showed Foley catheter in place of the bladder but did not appear to have an inflated balloon.  Also noted was bladder wall thickening and fat stranding.  Stable mass of the left kidney.  Large stool burden.  Patient received fentanyl, ceftriaxone, Zofran, 1 L IV fluids in the ED.  Review of Systems: As per HPI otherwise all other systems reviewed and are negative.  Past Medical History:  Diagnosis Date   Abnormality of gait 03/01/2015   Acute metabolic  encephalopathy 06/04/2020   Anxiety    Arthritis    "knees" (01/19/2016)   Asthma    as a child    Atrial fibrillation (HCC)    Back pain    Bilateral carpal tunnel syndrome 10/04/2020   Cancer (HCC)    / RENAL CELL CARCINOMA - GETS CT SCAN EVERY 6 MONTHS TO WATCH    CHF (congestive heart failure) (HCC)    Chronic pain    "nerve pain from the MS"   Complex atypical endometrial hyperplasia    Constipation    COVID-19 long hauler manifesting chronic dyspnea 09/13/2020   COVID-19 virus infection 08/23/2019   Depression    DVT (deep venous thrombosis) (HCC) 1983   "RLE; may have just been phlebitis; it was before the age of dopplers"   Dysrhythmia    afib    Edema    varicose veins with severe venous insuff in R and L GSV' ablation of R GSV 2012   GERD (gastroesophageal reflux disease)    HLD (hyperlipidemia)    Hypertension    Hypotension    Hypothyroidism    Joint pain    Multiple sclerosis (HCC)    has had this may 1989-[facility] reg doc   OSA on CPAP    bipap machine - SLEEP APNEA WORSE SINCE COVID IN 1/21 PER PATIENT SETTING HAS INCREASED FROM 9 TO 30    Osteoarthritis    PAF (paroxysmal atrial fibrillation) (HCC)    on coumadin; documented on monitor 06/2010 - no longer takes per [facility] 02/09/21   Pneumonia 11/2011   Recurrent UTI 04/07/2020   Sepsis (HCC) 06/04/2020   Sepsis secondary to UTI (HCC) 11/20/2022   Sleep  apnea    uses sleep apnea machine    Past Surgical History:  Procedure Laterality Date   ANTERIOR CERVICAL DECOMP/DISCECTOMY FUSION  01/2004   CARDIAC CATHETERIZATION  06/22/2010   normal coronary arteries, PAF   CATARACT EXTRACTION, BILATERAL Bilateral    CHILD BIRTHS     X2   DILATATION & CURETTAGE/HYSTEROSCOPY WITH MYOSURE N/A 01/15/2020   Procedure: DILATATION & CURETTAGE/HYSTEROSCOPY WITH MYOSURE;  Surgeon: Richardean Chimera, MD;  Location: [facility];  Service: Gynecology;  Laterality: N/A;  pt in wheelchair-has MS   DILATION AND CURETTAGE OF  UTERUS     DILATION AND CURETTAGE OF UTERUS N/A 02/16/2020   Procedure: DILATATION AND CURETTAGE;  Surgeon: Carver Fila, MD;  Location: [facility];  Service: Gynecology;  Laterality: N/A;  NOT GENERAL -NO INTUBATION   DNC     INFUSION PUMP IMPLANTATION  ~ 2009   baclofen infusion in lower abd   INTRATHECAL PUMP REVISION N/A 02/18/2021   Procedure: Baclofen Pump replacement with possible revision of catheter;  Surgeon: Maeola Harman, MD;  Location: [facility] OR;  Service: Neurosurgery;  Laterality: N/A;  3C/RM 20   INTRAUTERINE DEVICE (IUD) INSERTION N/A 02/16/2020   Procedure: INTRAUTERINE DEVICE (IUD) INSERTION - MIRENA;  Surgeon: Carver Fila, MD;  Location: [facility];  Service: Gynecology;  Laterality: N/A;   IUD put in uterus     PAIN PUMP REVISION N/A 07/29/2014   Procedure: Baclofen pump replacement;  Surgeon: Maeola Harman, MD;  Location: [facility];  Service: Neurosurgery;  Laterality: N/A;  Baclofen pump replacement   TONSILLECTOMY AND ADENOIDECTOMY     VARICOSE VEIN SURGERY  ~ 1968   VENOUS ABLATION  12/16/2010   radiofreq ablation -Dr Lynnea Ferrier and Hilty   WISDOM TOOTH EXTRACTION      Social History  reports that she quit smoking about 47 years ago. Her smoking use included cigarettes. She started smoking about 54 years ago. She has a 3.5 pack-year smoking history. She has never used smokeless tobacco. She reports that she does not currently use alcohol. She reports that she does not use drugs.  Allergies  Allergen Reactions   Other Hives and Rash    Topical Surgical prep  - severe rash, hives   Hydrocodone Nausea And Vomiting   Oxycontin [Oxycodone] Nausea And Vomiting   Zoloft [Sertraline] Hives, Swelling and Rash   Chloraseptic [Phenol] Hives    Not documented on MAR   Macrobid [Nitrofurantoin] Nausea And Vomiting    Not documented on MAR   Vioxx [Rofecoxib] Other (See Comments)    Unknown reaction Not documented on MAR Can take NSAIDS   Hibiclens [Chlorhexidine  Gluconate] Hives, Rash and Other (See Comments)    Sores    Family History  Problem Relation Age of Onset   Depression Mother    Sudden death Mother    Anxiety disorder Mother    Coronary artery disease Father        at age 70   Hyperlipidemia Father    Thyroid disease Father    Coronary artery disease Maternal Grandmother    Depression Maternal Grandmother    Cancer Paternal Grandmother        breast   Alcoholism Son    Sleep apnea Neg Hx    Multiple sclerosis Neg Hx   Reviewed on admission  Prior to Admission medications   Medication Sig Start Date End Date Taking? Authorizing Provider  acetaminophen (TYLENOL) 500 MG tablet Take 1,000 mg by mouth every 8 (eight) hours  as needed for fever or headache (pain).    [provider]  ALPRAZolam Prudy Feeler) 0.5 MG tablet Take 1 tablet (0.5 mg total) by mouth at bedtime. May also take 1 tablet every 12 hours as needed anxiety Patient taking differently: Take 0.5 mg by mouth at bedtime. 07/17/22   Ghimire, Werner Lean, MD  apixaban (ELIQUIS) 5 MG TABS tablet Take 5 mg by mouth 2 (two) times daily.    [provider]  baclofen (LIORESAL) 20 MG tablet Take 20 mg by mouth daily as needed for muscle spasms.    [provider]  Biotin 40981 MCG TABS Take 10,000 mcg by mouth daily after lunch.    [provider]  bisacodyl (DULCOLAX) 10 MG suppository Place 10 mg rectally See admin instructions. Insert 1 suppository rectally  once a day on Sun, Tue, Thurs    [provider]  buPROPion (WELLBUTRIN XL) 150 MG 24 hr tablet Take 1 tablet (150 mg total) by mouth daily. 10/18/20   Dohmeier, Porfirio Mylar, MD  Calcium Carb-Cholecalciferol (CALCIUM 500/D) 500-10 MG-MCG CHEW Chew 1 tablet by mouth 2 (two) times daily.    [provider]  cetirizine (ZYRTEC) 10 MG tablet Take 10 mg by mouth daily as needed for allergies.    [provider]  cholecalciferol 25 MCG (1000 UT) tablet Take 2,000 Units by mouth 2  (two) times daily with breakfast and lunch.    [provider]  corticotropin (ACTHAR) 80 UNIT/ML injectable gel Inject 1 mL (80 Units total) into the skin See admin instructions for 10 days. Inject 80 units as needed (about 5 times a month) Patient not taking: Reported on 06/19/2023 02/21/23 06/13/24  Dohmeier, Porfirio Mylar, MD  corticotropin (ATHACAR H.P.) 80 UNIT/ML injectable gel Give subcutaneous 80 units every other day for 10 days ( 5 shots ) a month. 02/21/23   Dohmeier, Porfirio Mylar, MD  diphenhydrAMINE (BENADRYL) 25 mg capsule Take 25 mg by mouth 3 (three) times daily as needed (minor itching, irritation).    [provider]  docusate sodium (COLACE) 100 MG capsule Take 100 mg by mouth 2 (two) times daily.    [provider]  Emollient (CERAVE DAILY MOISTURIZING) LOTN Apply 1 application  topically daily. To groin, inguinal area    [provider]  famotidine (PEPCID) 20 MG tablet Take 20 mg by mouth 2 (two) times daily.    [provider]  fluticasone (FLONASE) 50 MCG/ACT nasal spray Place 1 spray into both nostrils daily as needed (for nasal congestion).    [provider]  gabapentin (NEURONTIN) 300 MG capsule Mrs. Streater will take 300 mg of gabapentin at 6 AM, 10 AM, 2 PM and 6 PM and 600 mg at 10 PM by mouth. Patient taking differently: Take 300 mg by mouth See admin instructions. 300 mg 5 times daily at 0600, 1000, 1400, 1800 and 300-600 mg at bedtime. 03/07/21   Dohmeier, Porfirio Mylar, MD  ibuprofen (ADVIL) 400 MG tablet Take 400 mg by mouth 3 (three) times daily.    [provider]  lactulose (CHRONULAC) 10 GM/15ML solution Take 15 mLs (10 g total) by mouth 2 (two) times daily as needed for mild constipation. Patient taking differently: Take 10 g by mouth 2 (two) times daily. 07/17/22   Ghimire, Werner Lean, MD  levothyroxine (SYNTHROID) 75 MCG tablet Take 75 mcg by mouth daily before breakfast.    [provider]  melatonin 5 MG TABS Take  5 mg by mouth at bedtime.  [provider]  metolazone (ZAROXOLYN) 2.5 MG tablet Take 2.5 mg by mouth daily as needed (weight gain > 3 lbs in 2 days with swelling.).    [provider]  metoprolol succinate (TOPROL-XL) 25 MG 24 hr tablet Take 1 tablet (25 mg total) by mouth daily. 11/25/22 06/13/24  Zigmund Daniel., MD  midodrine (PROAMATINE) 2.5 MG tablet Take 2 tablets (5 mg total) by mouth 3 (three) times daily as needed (hypotension (systolic blood pressure less than 100)). 07/02/23   Hilty, Lisette Abu, MD  Multiple Vitamins-Minerals (CEROVITE SENIOR) TABS Take 1 tablet by mouth daily.    [provider]  NONFORMULARY OR COMPOUNDED ITEM Apply 1 Application topically See admin instructions. 1:1 clotrimazole 1% and hydrocortisone 1%. Apply twice daily to perineal region and abdominal folds.    [provider]  ondansetron (ZOFRAN-ODT) 4 MG disintegrating tablet Take 4 mg by mouth 4 (four) times daily as needed for nausea.    [provider]  oxybutynin (DITROPAN) 5 MG tablet Take 5 mg by mouth 2 (two) times daily as needed (for overactive bladder).    [provider]  phenazopyridine (PYRIDIUM) 100 MG tablet Take 100 mg by mouth every 8 (eight) hours as needed (dysuria).    [provider]  Polyethyl Glycol-Propyl Glycol (SYSTANE) 0.4-0.3 % SOLN Place 1 drop into both eyes in the morning and at bedtime.    [provider]  polyethylene glycol (MIRALAX / GLYCOLAX) 17 g packet Take 17 g by mouth daily.    [provider]  potassium chloride SA (KLOR-CON) 20 MEQ tablet Take 2 tablets (40 mEq total) by mouth daily. Patient taking differently: Take 40 mEq by mouth 2 (two) times daily. 01/03/21   Hilty, Lisette Abu, MD  Prucalopride Succinate (MOTEGRITY) 2 MG TABS Take 2 mg by mouth daily.    [provider]  Psyllium (METAMUCIL PO) Take 1 packet by mouth 2 (two) times daily. Metamucil 2.4g/5.4g powder packets.     [provider]  Salicylic Acid-Cleanser 6 % CREAM KIT Apply 1 each topically See admin instructions. Apply topically on Mon, Wed, Sat to lesion on foot    [provider]  senna (SENOKOT) 8.6 MG TABS tablet Take 17.2 mg by mouth 2 (two) times daily.    [provider]  sodium phosphate (FLEET) 7-19 GM/118ML ENEM Place 1 enema rectally daily as needed (constipation).    [provider]  torsemide (DEMADEX) 20 MG tablet Take 1.5 tablets (30 mg total) by mouth 2 (two) times daily. 01/30/23   Carlos Levering, NP    Physical Exam: Vitals:   08/13/23 1311 08/13/23 1314 08/13/23 1447 08/13/23 1650  BP: 105/72  91/70 (!) 99/59  Pulse: 68  68 76  Resp: 18  18 18   Temp: 98.1 F (36.7 C)     TempSrc: Oral     SpO2: 100%  100% 99%  Weight:  88.9 kg    Height:  5\' 3"  (1.6 m)      Physical Exam Constitutional:      General: She is not in acute distress.    Appearance: Normal appearance. She is obese.  HENT:     Head: Normocephalic and atraumatic.     Mouth/Throat:     Mouth: Mucous membranes are moist.     Pharynx: Oropharynx is clear.  Eyes:     Extraocular Movements: Extraocular movements intact.     Pupils: Pupils are equal, round, and reactive to light.  Cardiovascular:  Rate and Rhythm: Normal rate and regular rhythm.     Pulses: Normal pulses.     Heart sounds: Normal heart sounds.  Pulmonary:     Effort: Pulmonary effort is normal. No respiratory distress.     Breath sounds: Normal breath sounds.  Abdominal:     General: Bowel sounds are normal. There is no distension.     Palpations: Abdomen is soft.     Tenderness: There is no abdominal tenderness.  Musculoskeletal:        General: No swelling or deformity.  Skin:    General: Skin is warm and dry.  Neurological:     General: No focal deficit present.     Comments: Spastic quadriplegia with neurogenic bladder    Labs on Admission: I have personally reviewed following labs and  imaging studies  CBC: Recent Labs  Lab 08/13/23 1316  WBC 9.2  HGB 13.8  HCT 40.2  MCV 91.2  PLT 231    Basic Metabolic Panel: Recent Labs  Lab 08/13/23 1316  NA 124*  K 3.9  CL 81*  CO2 34*  GLUCOSE 95  BUN 10  CREATININE 0.70  CALCIUM 8.8*    GFR: Estimated Creatinine Clearance: 63.3 mL/min (by C-G formula based on SCr of 0.7 mg/dL).  Liver Function Tests: Recent Labs  Lab 08/13/23 1316  AST 14*  ALT 11  ALKPHOS 64  BILITOT 0.6  PROT 6.5  ALBUMIN 3.4*    Urine analysis:    Component Value Date/Time   COLORURINE YELLOW 08/13/2023 1316   APPEARANCEUR CLOUDY (A) 08/13/2023 1316   APPEARANCEUR Clear 12/18/2014 1156   LABSPEC 1.002 (L) 08/13/2023 1316   PHURINE 8.0 08/13/2023 1316   GLUCOSEU NEGATIVE 08/13/2023 1316   HGBUR MODERATE (A) 08/13/2023 1316   BILIRUBINUR NEGATIVE 08/13/2023 1316   BILIRUBINUR Negative 12/18/2014 1156   KETONESUR NEGATIVE 08/13/2023 1316   PROTEINUR NEGATIVE 08/13/2023 1316   UROBILINOGEN 0.2 08/05/2014 0951   NITRITE NEGATIVE 08/13/2023 1316   LEUKOCYTESUR LARGE (A) 08/13/2023 1316    Radiological Exams on Admission: CT ABDOMEN PELVIS W CONTRAST Result Date: 08/13/2023 CLINICAL DATA:  Left lower quadrant abdominal pain * Tracking Code: BO * EXAM: CT ABDOMEN AND PELVIS WITH CONTRAST TECHNIQUE: Multidetector CT imaging of the abdomen and pelvis was performed using the standard protocol following bolus administration of intravenous contrast. RADIATION DOSE REDUCTION: This exam was performed according to the departmental dose-optimization program which includes automated exposure control, adjustment of the mA and/or kV according to patient size and/or use of iterative reconstruction technique. CONTRAST:  75mL OMNIPAQUE IOHEXOL 350 MG/ML SOLN COMPARISON:  07/13/2022 FINDINGS: Lower chest: No acute abnormality.  Cardiomegaly. Hepatobiliary: No solid liver abnormality is seen. Mildly distended gallbladder. No visible gallstones,  gallbladder wall thickening, or biliary dilatation. Pancreas: Unremarkable. No pancreatic ductal dilatation or surrounding inflammatory changes. Spleen: Normal in size without significant abnormality. Adrenals/Urinary Tract: Adrenal glands are unremarkable. Unchanged small enhancing mass arising from the superior pole of the left kidney measuring 1.4 x 1.1 cm (series 6, image 55). Foley catheter in the bladder. Catheter tip appears to be within the bladder wall with adjacent bladder wall thickening and fat stranding (series 6, image 56). Stomach/Bowel: Stomach is within normal limits. Appendix appears normal. No evidence of bowel wall thickening, distention, or inflammatory changes. Large burden of stool throughout the colon and rectum. Vascular/Lymphatic: No significant vascular findings are present. No enlarged abdominal or pelvic lymph nodes. Reproductive: No mass or other significant abnormality. IUD present in the fundal  endometrial cavity. Other: No abdominal wall hernia or abnormality. No ascites. Musculoskeletal: No acute or significant osseous findings. IMPRESSION: 1. Foley catheter in the bladder. Catheter tip appears to be within the bladder wall with adjacent bladder wall thickening and fat stranding. 2. Unchanged small enhancing mass arising from the superior pole of the left kidney measuring 1.4 x 1.1 cm, consistent with small renal cell carcinoma. 3. Large burden of stool throughout the colon and rectum. Electronically Signed   By: Jearld Lesch M.D.   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  Patient's baseline ranges from 125 to mid 130s range.  Likely secondary to decreased p.o. intake/dehydration in the setting of above. > With some degree of dehydration and low normal blood pressure we will monitor on progressive unit for now. - Monitor in progressive unit - Trend BMP now and every 6 hours - Normal saline 75 cc an hour - Check urine sodium, urine osmolality, serum osmolality  ?Ileus  Constipation > Patient presenting with abdominal pain after a week of abdominal distention at facility.  Imaging there showed possible ileus and patient was placed on liquid diet and given an enema which led to subsequent significant cramping and nausea and vomiting. > Imaging here shows large stool burden. - Start lactulose, MiraLAX, Metamucil - Resume as needed Fleet enema - Full liquid diet  ?UTI > History of recurrent UTI.  Suspicious urinalysis.  Inflammation of bladder on CT. - Continue ceftriaxone as started in the ED - Follow-up urine culture  Multiple sclerosis Spastic quadriplegia History of intrathecal baclofen pump Neurogenic bladder Neurogenic hypoventilation Bladder spasms Chronic Foley catheter > Balloon not inflated in the ED imaging.  Unclear if this deflated for some other reason or popped with bladder spasm. - Replace Foley catheter - Continue with home baclofen pump -  Treatment for hypoventilation as below  OSA OHS - Continue BiPAP nightly  Chronic combined diastolic and systolic CHF - Last echo was in 2023 with EF 45 from 50%, indeterminate diastolic function, normal RV function. - Holding torsemide, metolazone, metoprolol in the setting of hyponatremia and low normal blood pressure  Low blood pressure > BP in the 90s to 110s systolic in the ED.  He is prescribed midodrine as needed for blood pressure less than 100 systolic. - Continue as needed midodrine   ACTH dependent Cushing's syndrome - Is prescribed subcutaneous ACTH outpatient (working to confirm)  Atrial fibrillation - Holding metoprolol for now, likely able to resume tomorrow - Continue home Eliquis  Hypothyroidism - Continue home Synthroid  Depression Anxiety - Continue  home bupropion, as needed Xanax  Obesity - Noted  DVT prophylaxis: Eliquis Code Status:   Full Family Communication:  Updated at Bedside  Disposition Plan:   Patient is from:  Home  Anticipated DC to:  Home  Anticipated DC date:  1 to 3 days  Anticipated DC barriers: None  Consults called:  None Admission status:  Observation, progressive  Severity of Illness: The appropriate patient status for this patient is OBSERVATION. Observation status is judged to be reasonable and necessary in order to provide the required intensity of service to ensure the patient's safety. The patient's presenting symptoms, physical exam findings, and initial radiographic and laboratory data in the context of their medical condition is felt to place them at decreased risk for further clinical deterioration. Furthermore, it is anticipated that the patient will be medically stable for discharge from the hospital within 2 midnights of admission.    Synetta Fail MD [facility]  How to contact the [facility] Attending or Consulting provider 7A - 7P or covering provider during after hours 7P -7A, for this patient?   Check the  care team in [facility] and look for a) attending/consulting [facility] provider listed and b) the [facility] team listed Log into [email or web address] and use [facility] universal password to access. If you do not have the password, please contact the hospital operator. Locate the [facility] provider you are looking for under [facility] and page to a number that you can be directly reached. If you still have difficulty reaching the provider, please page the [facility] (Director on Call) for the Hospitalists listed on amion for assistance.  08/13/2023, 4:56 PM

## 2023-08-13 NOTE — ED Provider Triage Note (Signed)
Emergency Medicine Provider Triage Evaluation Note  Toni [name] , a 77 y.o. female  was evaluated in triage.  Pt complains of abdominal pain  Review of Systems  Positive: Left upper abdominal pain Negative: fever  Physical Exam  BP 105/72 (BP Location: Left Arm)   Pulse 68   Temp 98.1 F (36.7 C) (Oral)   Resp 18   Ht 5\' 3"  (1.6 m)   Wt 88.9 kg   SpO2 100%   BMI 34.72 kg/m  Gen:   Awake, no distress   Resp:  Normal effort  MSK:   Moves extremities without difficulty  Other:    Medical Decision Making  Medically screening exam initiated at 1:37 PM.  Appropriate orders placed.  NUR GOSSMAN was informed that the remainder of the evaluation will be completed by another provider, this initial triage assessment does not replace that evaluation, and the importance of remaining in the ED until their evaluation is complete.     Elson Areas, [place] 08/13/23 1338

## 2023-08-13 NOTE — ED Provider Notes (Signed)
[place] EMERGENCY DEPARTMENT AT [facility] Provider Note   CSN: [id number] Arrival date & time: 08/13/23  1259     History  Chief Complaint  Patient presents with   Abdominal Pain    Toni [name] is a 77 y.o. female history of multiple sclerosis who is bedbound with Foley catheter, recurrent hyponatremia, heart failure, here presenting with weakness and abdominal pain.  Patient states that a week ago she has been having some abdominal distention.  She had an x-ray at the facility that showed possible ileus and constipation.  Patient was started on a liquid diet and also given enema.  She states that she did have diarrhea afterwards.  She then had severe abdominal cramps and vomiting and unable to keep anything down.  Patient was noted to be hypotensive in triage.  She also felt that her Foley was leaking.  Her Foley was replaced about a week ago.  The history is provided by the patient and a relative.       Home Medications Prior to Admission medications   Medication Sig Start Date End Date Taking? Authorizing Provider  acetaminophen (TYLENOL) 500 MG tablet Take 1,000 mg by mouth every 8 (eight) hours as needed for fever or headache (pain).    [provider]  ALPRAZolam Prudy Feeler) 0.5 MG tablet Take 1 tablet (0.5 mg total) by mouth at bedtime. May also take 1 tablet every 12 hours as needed anxiety Patient taking differently: Take 0.5 mg by mouth at bedtime. 07/17/22   Ghimire, Werner Lean, MD  apixaban (ELIQUIS) 5 MG TABS tablet Take 5 mg by mouth 2 (two) times daily.    [provider]  baclofen (LIORESAL) 20 MG tablet Take 20 mg by mouth daily as needed for muscle spasms.    [provider]  Biotin 08657 MCG TABS Take 10,000 mcg by mouth daily after lunch.    [provider]  bisacodyl (DULCOLAX) 10 MG suppository Place 10 mg rectally See admin instructions. Insert 1 suppository rectally  once a day on Sun, Tue, Thurs    [provider]  buPROPion (WELLBUTRIN XL) 150 MG 24 hr tablet Take 1 tablet (150 mg total) by mouth daily. 10/18/20   Dohmeier, Porfirio Mylar, MD  Calcium Carb-Cholecalciferol (CALCIUM 500/D) 500-10 MG-MCG CHEW Chew 1 tablet by mouth 2 (two) times daily.    [provider]  cetirizine (ZYRTEC) 10 MG tablet Take 10 mg by mouth daily as needed for allergies.    [provider]  cholecalciferol 25 MCG (1000 UT) tablet Take 2,000 Units by mouth 2 (two) times daily with breakfast and lunch.    [provider]  ciprofloxacin (CIPRO) 500 MG tablet Take 500 mg by mouth 2 (two) times daily. Patient not taking: Reported on 06/19/2023 06/08/23   [provider]  corticotropin (ACTHAR) 80 UNIT/ML injectable gel Inject 1 mL (80 Units total) into the skin See admin instructions for 10 days. Inject 80 units as needed (about 5 times a month) Patient not taking: Reported on 06/19/2023 02/21/23 06/13/24  Dohmeier, Porfirio Mylar, MD  corticotropin (ATHACAR H.P.) 80 UNIT/ML injectable gel Give subcutaneous 80 units every other day for 10 days ( 5 shots ) a month. 02/21/23   Dohmeier, Porfirio Mylar, MD  diphenhydrAMINE (BENADRYL) 25 mg capsule Take 25 mg by mouth 3 (three) times daily as needed (minor itching, irritation).    [provider]  docusate sodium (COLACE) 100 MG capsule Take 100 mg by mouth 2 (two) times daily.  [provider]  Emollient (CERAVE DAILY MOISTURIZING) LOTN Apply 1 application  topically daily. To groin, inguinal area    [provider]  famotidine (PEPCID) 20 MG tablet Take 20 mg by mouth 2 (two) times daily.    [provider]  fluticasone (FLONASE) 50 MCG/ACT nasal spray Place 1 spray into both nostrils daily as needed (for nasal congestion).    [provider]  gabapentin (NEURONTIN) 300 MG capsule Mrs. Delorey will take 300 mg of gabapentin at 6 AM, 10 AM, 2 PM and 6 PM and 600 mg at 10 PM by mouth. Patient taking differently: Take 300  mg by mouth See admin instructions. 300 mg 5 times daily at 0600, 1000, 1400, 1800 and 300-600 mg at bedtime. 03/07/21   Dohmeier, Porfirio Mylar, MD  ibuprofen (ADVIL) 400 MG tablet Take 400 mg by mouth 3 (three) times daily.    [provider]  lactulose (CHRONULAC) 10 GM/15ML solution Take 15 mLs (10 g total) by mouth 2 (two) times daily as needed for mild constipation. Patient taking differently: Take 10 g by mouth 2 (two) times daily. 07/17/22   Ghimire, Werner Lean, MD  levothyroxine (SYNTHROID) 75 MCG tablet Take 75 mcg by mouth daily before breakfast.    [provider]  melatonin 5 MG TABS Take 5 mg by mouth at bedtime.    [provider]  metolazone (ZAROXOLYN) 2.5 MG tablet Take 2.5 mg by mouth daily as needed (weight gain > 3 lbs in 2 days with swelling.).    [provider]  metoprolol succinate (TOPROL-XL) 25 MG 24 hr tablet Take 1 tablet (25 mg total) by mouth daily. 11/25/22 06/13/24  Zigmund Daniel., MD  midodrine (PROAMATINE) 2.5 MG tablet Take 2 tablets (5 mg total) by mouth 3 (three) times daily as needed (hypotension (systolic blood pressure less than 100)). 07/02/23   Hilty, Lisette Abu, MD  Multiple Vitamins-Minerals (CEROVITE SENIOR) TABS Take 1 tablet by mouth daily.    [provider]  NONFORMULARY OR COMPOUNDED ITEM Apply 1 Application topically See admin instructions. 1:1 clotrimazole 1% and hydrocortisone 1%. Apply twice daily to perineal region and abdominal folds.    [provider]  ondansetron (ZOFRAN-ODT) 4 MG disintegrating tablet Take 4 mg by mouth 4 (four) times daily as needed for nausea.    [provider]  oxybutynin (DITROPAN) 5 MG tablet Take 5 mg by mouth 2 (two) times daily as needed (for overactive bladder).    [provider]  phenazopyridine (PYRIDIUM) 100 MG tablet Take 100 mg by mouth every 8 (eight) hours as needed (dysuria).    [provider]  Polyethyl Glycol-Propyl Glycol  (SYSTANE) 0.4-0.3 % SOLN Place 1 drop into both eyes in the morning and at bedtime.    [provider]  polyethylene glycol (MIRALAX / GLYCOLAX) 17 g packet Take 17 g by mouth daily.    [provider]  potassium chloride SA (KLOR-CON) 20 MEQ tablet Take 2 tablets (40 mEq total) by mouth daily. Patient taking differently: Take 40 mEq by mouth 2 (two) times daily. 01/03/21   Hilty, Lisette Abu, MD  Prucalopride Succinate (MOTEGRITY) 2 MG TABS Take 2 mg by mouth daily.    [provider]  Psyllium (METAMUCIL PO) Take 1 packet by mouth 2 (two) times daily. Metamucil 2.4g/5.4g powder packets.    [provider]  Salicylic Acid-Cleanser 6 % CREAM KIT Apply 1 each topically See admin instructions. Apply topically on Mon, Wed, Sat  to lesion on foot    [provider]  senna (SENOKOT) 8.6 MG TABS tablet Take 17.2 mg by mouth 2 (two) times daily.    [provider]  sodium phosphate (FLEET) 7-19 GM/118ML ENEM Place 1 enema rectally daily as needed (constipation).    [provider]  torsemide (DEMADEX) 20 MG tablet Take 1.5 tablets (30 mg total) by mouth 2 (two) times daily. 01/30/23   Carlos Levering, NP      Allergies    Other, Hydrocodone, Oxycontin [oxycodone], Zoloft [sertraline], Chloraseptic [phenol], Macrobid [nitrofurantoin], Vioxx [rofecoxib], and Hibiclens [chlorhexidine gluconate]    Review of Systems   Review of Systems  Gastrointestinal:  Positive for abdominal pain.  All other systems reviewed and are negative.   Physical Exam Updated Vital Signs BP 91/70 (BP Location: Right Arm)   Pulse 68   Temp 98.1 F (36.7 C) (Oral)   Resp 18   Ht 5\' 3"  (1.6 m)   Wt 88.9 kg   SpO2 100%   BMI 34.72 kg/m  Physical Exam Vitals and nursing note reviewed.  Constitutional:      Comments: Chronically ill and dehydrated  HENT:     Head: Normocephalic.     Mouth/Throat:     Pharynx: Oropharynx is clear.  Eyes:     Extraocular  Movements: Extraocular movements intact.     Pupils: Pupils are equal, round, and reactive to light.  Cardiovascular:     Rate and Rhythm: Normal rate and regular rhythm.     Heart sounds: Normal heart sounds.  Pulmonary:     Breath sounds: Normal breath sounds.  Abdominal:     Comments: Slightly distended and mild epigastric tenderness  Genitourinary:    Comments: Foley catheter in place Skin:    General: Skin is warm.     Capillary Refill: Capillary refill takes less than 2 seconds.  Neurological:     General: No focal deficit present.  Psychiatric:        Mood and Affect: Mood normal.        Behavior: Behavior normal.     ED Results / Procedures / Treatments   Labs (all labs ordered are listed, but only abnormal results are displayed) Labs Reviewed  COMPREHENSIVE METABOLIC PANEL - Abnormal; Notable for the following components:      Result Value   Sodium 124 (*)    Chloride 81 (*)    CO2 34 (*)    Calcium 8.8 (*)    Albumin 3.4 (*)    AST 14 (*)    All other components within normal limits  URINALYSIS, ROUTINE W REFLEX MICROSCOPIC - Abnormal; Notable for the following components:   APPearance CLOUDY (*)    Specific Gravity, Urine 1.002 (*)    Hgb urine dipstick MODERATE (*)    Leukocytes,Ua LARGE (*)    Bacteria, UA MANY (*)    All other components within normal limits  URINE CULTURE  LIPASE, BLOOD  CBC  OSMOLALITY, URINE  OSMOLALITY  TROPONIN I (HIGH SENSITIVITY)  TROPONIN I (HIGH SENSITIVITY)    EKG None  Radiology CT ABDOMEN PELVIS W CONTRAST Result Date: 08/13/2023 CLINICAL DATA:  Left lower quadrant abdominal pain * Tracking Code: BO * EXAM: CT ABDOMEN AND PELVIS WITH CONTRAST TECHNIQUE: Multidetector CT imaging of the abdomen and pelvis was performed using the standard protocol following bolus administration of intravenous contrast. RADIATION DOSE REDUCTION: This exam was performed according to the departmental dose-optimization program which includes  automated exposure control, adjustment  of the mA and/or kV according to patient size and/or use of iterative reconstruction technique. CONTRAST:  75mL OMNIPAQUE IOHEXOL 350 MG/ML SOLN COMPARISON:  07/13/2022 FINDINGS: Lower chest: No acute abnormality.  Cardiomegaly. Hepatobiliary: No solid liver abnormality is seen. Mildly distended gallbladder. No visible gallstones, gallbladder wall thickening, or biliary dilatation. Pancreas: Unremarkable. No pancreatic ductal dilatation or surrounding inflammatory changes. Spleen: Normal in size without significant abnormality. Adrenals/Urinary Tract: Adrenal glands are unremarkable. Unchanged small enhancing mass arising from the superior pole of the left kidney measuring 1.4 x 1.1 cm (series 6, image 55). Foley catheter in the bladder. Catheter tip appears to be within the bladder wall with adjacent bladder wall thickening and fat stranding (series 6, image 56). Stomach/Bowel: Stomach is within normal limits. Appendix appears normal. No evidence of bowel wall thickening, distention, or inflammatory changes. Large burden of stool throughout the colon and rectum. Vascular/Lymphatic: No significant vascular findings are present. No enlarged abdominal or pelvic lymph nodes. Reproductive: No mass or other significant abnormality. IUD present in the fundal endometrial cavity. Other: No abdominal wall hernia or abnormality. No ascites. Musculoskeletal: No acute or significant osseous findings. IMPRESSION: 1. Foley catheter in the bladder. Catheter tip appears to be within the bladder wall with adjacent bladder wall thickening and fat stranding. 2. Unchanged small enhancing mass arising from the superior pole of the left kidney measuring 1.4 x 1.1 cm, consistent with small renal cell carcinoma. 3. Large burden of stool throughout the colon and rectum. Electronically Signed   By: Jearld Lesch M.D.   On: 08/13/2023 16:02    Procedures Procedures    CRITICAL CARE Performed by:  Richardean Canal   Total critical care time: 38 minutes  Critical care time was exclusive of separately billable procedures and treating other patients.  Critical care was necessary to treat or prevent imminent or life-threatening deterioration.  Critical care was time spent personally by me on the following activities: development of treatment plan with patient and/or surrogate as well as nursing, discussions with consultants, evaluation of patient's response to treatment, examination of patient, obtaining history from patient or surrogate, ordering and performing treatments and interventions, ordering and review of laboratory studies, ordering and review of radiographic studies, pulse oximetry and re-evaluation of patient's condition.   Medications Ordered in ED Medications  cefTRIAXone (ROCEPHIN) 1 g in sodium chloride 0.9 % 100 mL IVPB (1 g Intravenous New Bag/Given 08/13/23 1554)  sodium chloride 0.9 % bolus 1,000 mL (1,000 mLs Intravenous New Bag/Given 08/13/23 1553)  iohexol (OMNIPAQUE) 350 MG/ML injection 75 mL (75 mLs Intravenous Contrast Given 08/13/23 1513)  ondansetron (ZOFRAN) injection 4 mg (4 mg Intravenous Given 08/13/23 1544)  fentaNYL (SUBLIMAZE) injection 50 mcg (50 mcg Intravenous Given 08/13/23 1601)    ED Course/ Medical Decision Making/ A&P                                 Medical Decision Making Toni [name] is a 77 y.o. female here presenting with abdominal pain.  Patient was constipated and had large bowel movements after enema and now has cramps.  Consider SBO versus cramps from diarrhea versus ileus versus UTI.  Plan to get CBC CMP and UA and CT on pelvis  4:11 PM I reviewed patient's labs and patient is hyponatremic with sodium 124.  Her baseline sodium is around 1 26-1 30.  Patient has not been eating and drinking much and appears volume depleted.  UA showed UTI and catheter was replaced and urine culture was sent.  CT abdomen pelvis was unremarkable.  Patient  has a known kidney mass that has not changed.  Patient also still has some constipation.  At this point patient will be admitted for hyponatremia likely from hypovolemia and catheter associated UTI.  Problems Addressed: Hyponatremia: acute illness or injury Urinary tract infection associated with indwelling urethral catheter, initial encounter [facility]): acute illness or injury  Amount and/or Complexity of Data Reviewed Labs: ordered. Decision-making details documented in ED Course. Radiology: ordered and independent interpretation performed. Decision-making details documented in ED Course.  Risk Prescription drug management. Decision regarding hospitalization.    Final Clinical Impression(s) / ED Diagnoses Final diagnoses:  None    Rx / DC Orders ED Discharge Orders     None         Charlynne Pander, MD 08/13/23 [phone]

## 2023-08-13 NOTE — Plan of Care (Signed)

## 2023-08-13 NOTE — Progress Notes (Signed)
   08/13/23 2304  BiPAP/CPAP/SIPAP  BiPAP/CPAP/SIPAP Pt Type Adult  BiPAP/CPAP/SIPAP Resmed  Mask Type Full face mask  Patient Home Equipment Yes

## 2023-08-14 DIAGNOSIS — Z8616 Personal history of COVID-19: Secondary | ICD-10-CM | POA: Diagnosis not present

## 2023-08-14 DIAGNOSIS — E860 Dehydration: Secondary | ICD-10-CM | POA: Diagnosis present

## 2023-08-14 DIAGNOSIS — E871 Hypo-osmolality and hyponatremia: Secondary | ICD-10-CM | POA: Diagnosis present

## 2023-08-14 DIAGNOSIS — N390 Urinary tract infection, site not specified: Secondary | ICD-10-CM

## 2023-08-14 DIAGNOSIS — N3000 Acute cystitis without hematuria: Secondary | ICD-10-CM | POA: Diagnosis not present

## 2023-08-14 DIAGNOSIS — T83511A Infection and inflammatory reaction due to indwelling urethral catheter, initial encounter: Secondary | ICD-10-CM

## 2023-08-14 DIAGNOSIS — E861 Hypovolemia: Secondary | ICD-10-CM | POA: Diagnosis present

## 2023-08-14 DIAGNOSIS — E662 Morbid (severe) obesity with alveolar hypoventilation: Secondary | ICD-10-CM | POA: Diagnosis present

## 2023-08-14 DIAGNOSIS — K5909 Other constipation: Secondary | ICD-10-CM

## 2023-08-14 DIAGNOSIS — G35 Multiple sclerosis: Secondary | ICD-10-CM | POA: Diagnosis present

## 2023-08-14 DIAGNOSIS — I110 Hypertensive heart disease with heart failure: Secondary | ICD-10-CM | POA: Diagnosis present

## 2023-08-14 DIAGNOSIS — Z85528 Personal history of other malignant neoplasm of kidney: Secondary | ICD-10-CM | POA: Diagnosis not present

## 2023-08-14 DIAGNOSIS — G8250 Quadriplegia, unspecified: Secondary | ICD-10-CM | POA: Diagnosis present

## 2023-08-14 DIAGNOSIS — Z993 Dependence on wheelchair: Secondary | ICD-10-CM | POA: Diagnosis not present

## 2023-08-14 DIAGNOSIS — N319 Neuromuscular dysfunction of bladder, unspecified: Secondary | ICD-10-CM | POA: Diagnosis present

## 2023-08-14 DIAGNOSIS — I5042 Chronic combined systolic (congestive) and diastolic (congestive) heart failure: Secondary | ICD-10-CM | POA: Diagnosis present

## 2023-08-14 DIAGNOSIS — E876 Hypokalemia: Secondary | ICD-10-CM | POA: Diagnosis not present

## 2023-08-14 DIAGNOSIS — R1011 Right upper quadrant pain: Secondary | ICD-10-CM | POA: Diagnosis present

## 2023-08-14 DIAGNOSIS — E039 Hypothyroidism, unspecified: Secondary | ICD-10-CM | POA: Diagnosis present

## 2023-08-14 DIAGNOSIS — E249 Cushing's syndrome, unspecified: Secondary | ICD-10-CM | POA: Diagnosis present

## 2023-08-14 DIAGNOSIS — F32A Depression, unspecified: Secondary | ICD-10-CM | POA: Diagnosis present

## 2023-08-14 DIAGNOSIS — K567 Ileus, unspecified: Secondary | ICD-10-CM | POA: Diagnosis present

## 2023-08-14 DIAGNOSIS — Z79899 Other long term (current) drug therapy: Secondary | ICD-10-CM | POA: Diagnosis not present

## 2023-08-14 DIAGNOSIS — E785 Hyperlipidemia, unspecified: Secondary | ICD-10-CM | POA: Diagnosis present

## 2023-08-14 DIAGNOSIS — I5032 Chronic diastolic (congestive) heart failure: Secondary | ICD-10-CM | POA: Diagnosis not present

## 2023-08-14 DIAGNOSIS — I4821 Permanent atrial fibrillation: Secondary | ICD-10-CM | POA: Diagnosis present

## 2023-08-14 DIAGNOSIS — Z87891 Personal history of nicotine dependence: Secondary | ICD-10-CM | POA: Diagnosis not present

## 2023-08-14 DIAGNOSIS — Z79890 Hormone replacement therapy (postmenopausal): Secondary | ICD-10-CM | POA: Diagnosis not present

## 2023-08-14 DIAGNOSIS — F419 Anxiety disorder, unspecified: Secondary | ICD-10-CM | POA: Diagnosis present

## 2023-08-14 DIAGNOSIS — Y846 Urinary catheterization as the cause of abnormal reaction of the patient, or of later complication, without mention of misadventure at the time of the procedure: Secondary | ICD-10-CM | POA: Diagnosis present

## 2023-08-14 LAB — BASIC METABOLIC PANEL
Anion gap: 10 (ref 5–15)
Anion gap: 9 (ref 5–15)
Anion gap: 5 (ref 5–15)
BUN: 8 mg/dL (ref 8–23)
BUN: 7 mg/dL — ABNORMAL LOW (ref 8–23)
BUN: 6 mg/dL — ABNORMAL LOW (ref 8–23)
CO2: 31 mmol/L (ref 22–32)
CO2: 30 mmol/L (ref 22–32)
CO2: 34 mmol/L — ABNORMAL HIGH (ref 22–32)
Calcium: 8.4 mg/dL — ABNORMAL LOW (ref 8.9–10.3)
Calcium: 8.4 mg/dL — ABNORMAL LOW (ref 8.9–10.3)
Calcium: 8.6 mg/dL — ABNORMAL LOW (ref 8.9–10.3)
Chloride: 92 mmol/L — ABNORMAL LOW (ref 98–111)
Chloride: 91 mmol/L — ABNORMAL LOW (ref 98–111)
Chloride: 94 mmol/L — ABNORMAL LOW (ref 98–111)
Creatinine, Ser: 0.58 mg/dL (ref 0.44–1.00)
Creatinine, Ser: 0.58 mg/dL (ref 0.44–1.00)
Creatinine, Ser: 0.5 mg/dL (ref 0.44–1.00)
GFR, Estimated: >60 mL/min (ref >60)
GFR, Estimated: >60 mL/min (ref >60)
GFR, Estimated: >60 mL/min (ref >60)
Glucose, Bld: 76 mg/dL (ref 70–99)
Glucose, Bld: 144 mg/dL — ABNORMAL HIGH (ref 70–99)
Glucose, Bld: 83 mg/dL (ref 70–99)
Potassium: 3.6 mmol/L (ref 3.5–5.1)
Potassium: 3.6 mmol/L (ref 3.5–5.1)
Potassium: 3.5 mmol/L (ref 3.5–5.1)
Sodium: 133 mmol/L — ABNORMAL LOW (ref 135–145)
Sodium: 130 mmol/L — ABNORMAL LOW (ref 135–145)
Sodium: 133 mmol/L — ABNORMAL LOW (ref 135–145)

## 2023-08-14 LAB — CBC
HCT: 36 % (ref 36.0–46.0)
Hemoglobin: 12.5 g/dL (ref 12.0–15.0)
MCH: 31.7 pg (ref 26.0–34.0)
MCHC: 34.7 g/dL (ref 30.0–36.0)
MCV: 91.4 fL (ref 80.0–100.0)
Platelets: 173 10*3/uL (ref 150–400)
RBC: 3.94 MIL/uL (ref 3.87–5.11)
RDW: 12.1 % (ref 11.5–15.5)
WBC: 7.1 10*3/uL (ref 4.0–10.5)
nRBC: 0 % (ref 0.0–0.2)

## 2023-08-14 MED ORDER — GABAPENTIN 300 MG PO CAPS
300.0000 mg | ORAL_CAPSULE | Freq: Four times a day (QID) | ORAL | Status: DC
Start: 2023-08-14 — End: 2023-08-20
  Administered 2023-08-14 – 2023-08-20 (×26): 300 mg via ORAL
  Filled 2023-08-14 (×26): qty 1

## 2023-08-14 MED ORDER — ONDANSETRON HCL 4 MG/2ML IJ SOLN
4.0000 mg | Freq: Four times a day (QID) | INTRAMUSCULAR | Status: DC | PRN
  Administered 2023-08-14 – 2023-08-19 (×4): 4 mg via INTRAVENOUS
  Filled 2023-08-14 (×4): qty 2

## 2023-08-14 MED ORDER — ONDANSETRON HCL 4 MG PO TABS
4.0000 mg | ORAL_TABLET | Freq: Four times a day (QID) | ORAL | Status: DC | PRN
  Administered 2023-08-19: 4 mg via ORAL
  Filled 2023-08-14: qty 1

## 2023-08-14 NOTE — Plan of Care (Signed)

## 2023-08-14 NOTE — Plan of Care (Signed)

## 2023-08-14 NOTE — Progress Notes (Addendum)
Progress Note   Patient: Toni [name] ZOX:[id number] DOB: 01-13-1946 DOA: 08/13/2023     0 DOS: the patient was seen and examined on 08/14/2023   Brief hospital course: Toni [name] is a 77 y.o. 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 She was found to have hyponatremia in the setting of dehydration.  Assessment and Plan:  Hyponatremia Dehydration Vomiting -Lab workup suspicious for hyponatremia with sodium of 124.   -Patient's baseline ranges from 125 to mid 130s range.  -Likely secondary to decreased p.o. intake/dehydration in the setting of above. -Sodium improved to 133 -Discontinue IV fluids, patient feels she is becoming edematous -Follow-up morning BMP   Ileus  Constipation -Patient presenting with abdominal pain after a week of abdominal distention at facility.   -Imaging there showed possible ileus and patient was placed on liquid diet and given an enema which led to subsequent significant cramping and nausea and vomiting. -Imaging here shows large stool burden. -Reports a history of ileus in the setting of her multiple sclerosis -Patient reports she had small bowel movements -Continue lactulose, MiraLAX, Metamucil -Continue needed Fleet enema -Interested in regular diet, advance diet   Urinary tract infection -History of recurrent UTI.  -UA showed evidence of infection -Inflammation of bladder on CT. -Continue IV Rocephin -Follow-up urine culture   Multiple sclerosis Spastic quadriplegia History of intrathecal baclofen pump Neurogenic bladder Neurogenic hypoventilation Bladder spasms Chronic Foley catheter -Foley  catheter replaced -Continue with home baclofen pump -Treatment for hypoventilation as below   OSA OHS -Continue BiPAP nightly   Chronic combined diastolic and systolic CHF -Last echo was in 2023 with EF 45 from 50%, indeterminate diastolic function, normal RV function. -Her BP remains soft with SBP in the 90s to 100s. -Reports that her baseline SBP is 100-110 -She is not fluid overloaded on exam -Continue holding torsemide, metolazone, metoprolol for another day in the setting low normal blood pressure   History of hypotension -BP remains soft with SBP in the 90s to 100s.   -She is prescribed midodrine as needed for blood pressure less than 100 systolic. -Continue as needed midodrine   ACTH dependent Cushing's syndrome -Is prescribed subcutaneous ACTH outpatient (working to confirm)   Atrial fibrillation -Holding metoprolol for now, HR has been stable in the 90s, can likely resume tomorrow -Continue home Eliquis   Hypothyroidism -Continue home Synthroid   Depression Anxiety -Continue home bupropion, as needed Xanax      Subjective: Patient evaluated with spouse at bedside. States that she feels that she is retaining fluid because of all the IV fluid she is getting. She has small bowel movements earlier today but still feels constipated. She is not interested in an enema as she has used it in the past with success.  She would like to resume her fluid pill as soon as possible.  Physical Exam: Vitals:   08/14/23 1344 08/14/23 1402 08/14/23 1407 08/14/23 1720  BP:   (!) 90/47 (!) 104/44  Pulse:   97 97  Resp:   (!) 21 17  Temp:   98 F (36.7 C) 98.3 F (36.8 C)  TempSrc:  Oral Oral Oral  SpO2:   99% 97%  Weight: 93.7 kg     Height:       General:  Chronically ill-appearing obese woman laying in bed. No acute distress. HEENT: Toni [name]/AT. Anicteric sclera. MMM. CV: Mild tachycardia. Irregular rhythm. No murmurs, rubs, or gallops. No LE edema Pulmonary: Lungs CTAB. Normal  effort. No wheezing or rales. Abdominal: Soft, nontender, nondistended. Mild abdominal wall edema. Normal bowel sounds. Extremities: Palpable radial and DP pulses. Normal ROM. Skin: Warm and dry. No obvious rash or lesions. Neuro: A&Ox3. Spastic quadriplegic with neurogenic bladder Psych: Normal mood and affect  Data Reviewed: Pertinent Labs:    Latest Ref Rng & Units 08/14/2023    7:49 AM 08/13/2023    1:16 PM 06/15/2023    6:30 AM  CBC  WBC 4.0 - 10.5 K/uL 7.1  9.2  7.0   Hemoglobin 12.0 - 15.0 g/dL 32.9  51.8  84.1   Hematocrit 36.0 - 46.0 % 36.0  40.2  35.7   Platelets 150 - 400 K/uL 173  231  248        Latest Ref Rng & Units 08/14/2023    7:43 PM 08/14/2023    1:49 PM 08/14/2023    7:49 AM  CMP  Glucose 70 - 99 mg/dL 83  660  76   BUN 8 - 23 mg/dL 6  7  8    Creatinine 0.44 - 1.00 mg/dL 6.30  1.60  1.09   Sodium 135 - 145 mmol/L 133  130  133   Potassium 3.5 - 5.1 mmol/L 3.5  3.6  3.6   Chloride 98 - 111 mmol/L 94  91  92   CO2 22 - 32 mmol/L 34  30  31   Calcium 8.9 - 10.3 mg/dL 8.6  8.4  8.4      Family Communication: Discussed plan with spouse at bedside  Disposition: Status is: Inpatient Remains inpatient appropriate because: Constipation, hypotension, hyponatremia  Planned Discharge Destination: Pending    Time spent: 35 minutes  Author: Steffanie Rainwater, MD 08/14/2023 6:37 PM  For on call review [email or web address].

## 2023-08-14 NOTE — Hospital Course (Signed)
Toni [name] was admitted to the hospital with the working diagnosis of urinary tract infection complicated with ileus, dehydration, hypokalemia and hyponatremia.   77 y.o. 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

## 2023-08-15 DIAGNOSIS — E871 Hypo-osmolality and hyponatremia: Secondary | ICD-10-CM | POA: Diagnosis not present

## 2023-08-15 DIAGNOSIS — I4821 Permanent atrial fibrillation: Secondary | ICD-10-CM

## 2023-08-15 DIAGNOSIS — I5032 Chronic diastolic (congestive) heart failure: Secondary | ICD-10-CM

## 2023-08-15 DIAGNOSIS — F418 Other specified anxiety disorders: Secondary | ICD-10-CM

## 2023-08-15 DIAGNOSIS — E785 Hyperlipidemia, unspecified: Secondary | ICD-10-CM

## 2023-08-15 DIAGNOSIS — N3000 Acute cystitis without hematuria: Secondary | ICD-10-CM | POA: Diagnosis not present

## 2023-08-15 DIAGNOSIS — E240 Pituitary-dependent Cushing's disease: Secondary | ICD-10-CM

## 2023-08-15 DIAGNOSIS — E039 Hypothyroidism, unspecified: Secondary | ICD-10-CM

## 2023-08-15 DIAGNOSIS — G35 Multiple sclerosis: Secondary | ICD-10-CM

## 2023-08-15 LAB — BASIC METABOLIC PANEL
Anion gap: 7 (ref 5–15)
BUN: 5 mg/dL — ABNORMAL LOW (ref 8–23)
CO2: 33 mmol/L — ABNORMAL HIGH (ref 22–32)
Calcium: 8.6 mg/dL — ABNORMAL LOW (ref 8.9–10.3)
Chloride: 95 mmol/L — ABNORMAL LOW (ref 98–111)
Creatinine, Ser: 0.55 mg/dL (ref 0.44–1.00)
GFR, Estimated: >60 mL/min (ref >60)
Glucose, Bld: 90 mg/dL (ref 70–99)
Potassium: 3.1 mmol/L — ABNORMAL LOW (ref 3.5–5.1)
Sodium: 135 mmol/L (ref 135–145)

## 2023-08-15 LAB — CBC
HCT: 34 % — ABNORMAL LOW (ref 36.0–46.0)
Hemoglobin: 11.6 g/dL — ABNORMAL LOW (ref 12.0–15.0)
MCH: 31.5 pg (ref 26.0–34.0)
MCHC: 34.1 g/dL (ref 30.0–36.0)
MCV: 92.4 fL (ref 80.0–100.0)
Platelets: 180 10*3/uL (ref 150–400)
RBC: 3.68 MIL/uL — ABNORMAL LOW (ref 3.87–5.11)
RDW: 12.3 % (ref 11.5–15.5)
WBC: 8 10*3/uL (ref 4.0–10.5)
nRBC: 0 % (ref 0.0–0.2)

## 2023-08-15 MED ORDER — POTASSIUM CHLORIDE CRYS ER 20 MEQ PO TBCR
40.0000 meq | EXTENDED_RELEASE_TABLET | ORAL | Status: AC
  Administered 2023-08-15 (×2): 40 meq via ORAL
  Filled 2023-08-15 (×2): qty 2

## 2023-08-15 MED ORDER — TORSEMIDE 20 MG PO TABS
30.0000 mg | ORAL_TABLET | Freq: Two times a day (BID) | ORAL | Status: DC
  Administered 2023-08-16 – 2023-08-20 (×7): 30 mg via ORAL
  Filled 2023-08-15 (×9): qty 2

## 2023-08-15 MED ORDER — METOPROLOL SUCCINATE ER 25 MG PO TB24
25.0000 mg | ORAL_TABLET | Freq: Every day | ORAL | Status: DC
  Administered 2023-08-15 – 2023-08-18 (×3): 25 mg via ORAL
  Filled 2023-08-15 (×4): qty 1

## 2023-08-15 NOTE — Progress Notes (Signed)
  Progress Note   Patient: Toni [name] QVZ:[id number] DOB: 04/25/1946 DOA: 08/13/2023     1 DOS: the patient was seen and examined on 08/15/2023   Brief hospital course: Mrs. Tigue was admitted to the hospital with the working diagnosis of urinary tract infection complicated with ileus, dehydration, hypokalemia and hyponatremia.   77 y.o. 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   Assessment and Plan: * Hyponatremia Hypokalemia.   Volume status has improved. Rena function with serum bicarbonate at 33, Na 135, cr 0,5 and CL 95   Plan to continue K correction with Kcl 40 meq x2  Follow up renal function and electrolytes.  Avoid hypotension and nephrotoxic agents Ok to resume loop diuretic in am. Hold on further IV fluids.   UTI (urinary tract infection) Neurogenic bladder, urine infection associated with indwelling foley catheter.   Wbc is 8,0 and patient has been afebrile.   Plan to continue antibiotic therapy with ceftriaxone.  Follow up cultures, cell count and temperature curve.  Continue Foley care.  Oxybutynin for bladder spasms.   Permanent atrial fibrillation (HCC) Plan to resume metoprolol for rate control atrial fibrillation.  Continue anticoagulation with apixaban.  Continue telemetry monitoring.   Chronic diastolic CHF (congestive heart failure) (HCC) Echocardiogram with mild reduction in LV systolic function EF 45 to 50%, global hypokinesis, RV systolic function preserved. No significant valvular disease.   Plan to resume metoprolol and start loop diuretic in am.  Note that patient also take metolazone as needed at home.    Cushing's syndrome, ACTH dependent (HCC) Patient gets corticotropin injections q 10 days.   HLD (hyperlipidemia) Continue with statin therapy   Hypothyroidism Continue with levothyroxine   Depression with anxiety Continue with alprazolam.   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  Patient is wheelchair bound and non ambulatory.  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          Subjective: Patient is feeling better, this morning had intermittent palpitations due to tachycardia, no chest pain, no abdominal pain, no nausea or vomiting, positive bowel movements   Physical Exam: Vitals:   08/15/23 0454 08/15/23 0639 08/15/23 0750 08/15/23 0900  BP: (!) 100/56 (!) 111/51 102/62   Pulse: (!) 109 65    Resp: 16 18    Temp:   97.9 F (36.6 C)   TempSrc:   Axillary   SpO2: 97% 96%  96%  Weight:      Height:       Neurology awake and alert ENT with mild pallor Cardiovascular with S1 and S2 present, irregularly irregular with no gallops, rubs or murmurs No JVD Respiratory with no rales or wheezing, no rhonchi Abdomen with no distention, non tender, bowel sounds present Positive lower extremity edema ++ mild pitting  Data Reviewed:    Family Communication: I spoke with patient's son at the bedside, we talked in detail about patient's condition, plan of care and prognosis and all questions were addressed.   Disposition: Status is: Inpatient Remains inpatient appropriate because: recovering urine infection   Planned Discharge Destination: Home     Author: Coralie Keens, MD 08/15/2023 12:01 PM  For on call review [email or web address].

## 2023-08-15 NOTE — Assessment & Plan Note (Signed)
Hypokalemia.   Volume status has improved. Rena function with serum bicarbonate at 33, Na 135, cr 0,5 and CL 95   Plan to continue K correction with Kcl 40 meq x2  Follow up renal function and electrolytes.  Avoid hypotension and nephrotoxic agents Ok to resume loop diuretic in am. Hold on further IV fluids.

## 2023-08-15 NOTE — Assessment & Plan Note (Signed)
Patient gets corticotropin injections q 10 days.

## 2023-08-15 NOTE — Assessment & Plan Note (Signed)
Continue with levothyroxine  

## 2023-08-15 NOTE — Assessment & Plan Note (Signed)
Echocardiogram with mild reduction in LV systolic function EF 45 to 50%, global hypokinesis, RV systolic function preserved. No significant valvular disease.   Plan to resume metoprolol and start loop diuretic in am.  Note that patient also take metolazone as needed at home.

## 2023-08-15 NOTE — Assessment & Plan Note (Signed)
Continue with statin therapy.  ?

## 2023-08-15 NOTE — Assessment & Plan Note (Addendum)
Plan to resume metoprolol for rate control atrial fibrillation.  Continue anticoagulation with apixaban.  Continue telemetry monitoring.

## 2023-08-15 NOTE — Assessment & Plan Note (Signed)
Neurogenic bladder, urine infection associated with indwelling foley catheter.   Wbc is 8,0 and patient has been afebrile.   Plan to continue antibiotic therapy with ceftriaxone.  Follow up cultures, cell count and temperature curve.  Continue Foley care.  Oxybutynin for bladder spasms.

## 2023-08-15 NOTE — Assessment & Plan Note (Signed)
 Seems calm at admission  Plan Xanax prn

## 2023-08-15 NOTE — Assessment & Plan Note (Addendum)
Blood pressure support with midodrine.  Patient is wheelchair bound and non ambulatory.  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

## 2023-08-16 DIAGNOSIS — E871 Hypo-osmolality and hyponatremia: Secondary | ICD-10-CM | POA: Diagnosis not present

## 2023-08-16 LAB — CBC
HCT: 35.7 % — ABNORMAL LOW (ref 36.0–46.0)
Hemoglobin: 11.9 g/dL — ABNORMAL LOW (ref 12.0–15.0)
MCH: 31.6 pg (ref 26.0–34.0)
MCHC: 33.3 g/dL (ref 30.0–36.0)
MCV: 94.7 fL (ref 80.0–100.0)
Platelets: 177 10*3/uL (ref 150–400)
RBC: 3.77 MIL/uL — ABNORMAL LOW (ref 3.87–5.11)
RDW: 12.7 % (ref 11.5–15.5)
WBC: 8 10*3/uL (ref 4.0–10.5)
nRBC: 0 % (ref 0.0–0.2)

## 2023-08-16 LAB — BASIC METABOLIC PANEL
Anion gap: 10 (ref 5–15)
BUN: 14 mg/dL (ref 8–23)
CO2: 28 mmol/L (ref 22–32)
Calcium: 8.6 mg/dL — ABNORMAL LOW (ref 8.9–10.3)
Chloride: 99 mmol/L (ref 98–111)
Creatinine, Ser: 0.54 mg/dL (ref 0.44–1.00)
GFR, Estimated: >60 mL/min (ref >60)
Glucose, Bld: 111 mg/dL — ABNORMAL HIGH (ref 70–99)
Potassium: 4.9 mmol/L (ref 3.5–5.1)
Sodium: 137 mmol/L (ref 135–145)

## 2023-08-16 LAB — MAGNESIUM: Magnesium: 2.3 mg/dL (ref 1.7–2.4)

## 2023-08-16 MED ORDER — TORSEMIDE 20 MG PO TABS: 30.0000 mg | ORAL_TABLET | Freq: Two times a day (BID) | ORAL | Status: DC

## 2023-08-16 NOTE — Plan of Care (Signed)
  Problem: Activity: Goal: Risk for activity intolerance will decrease Outcome: Not Progressing   

## 2023-08-16 NOTE — Plan of Care (Signed)
  Problem: Education: Goal: Knowledge of General Education information will improve Description: Including pain rating scale, medication(s)/side effects and non-pharmacologic comfort measures Outcome: Progressing   Problem: Clinical Measurements: Goal: Ability to maintain clinical measurements within normal limits will improve Outcome: Progressing   Problem: Elimination: Goal: Will not experience complications related to bowel motility Outcome: Progressing

## 2023-08-16 NOTE — Progress Notes (Signed)
PROGRESS NOTE    Toni [name]  ZOX:[id number] DOB: 15-Feb-1946 DOA: 08/13/2023 PCP: Sherron Monday, MD  Chief Complaint  Patient presents with   Abdominal Pain    Brief Narrative:   [name] was admitted to the hospital with the working diagnosis of urinary tract infection complicated with ileus, dehydration, hypokalemia and hyponatremia.    77 y.o. 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  Continue anticoagulation with apixaban.  Continue telemetry monitoring.    Chronic diastolic CHF (congestive heart failure) (HCC) Echocardiogram with mild reduction in LV systolic function EF 45 to 50%, global hypokinesis, RV systolic function preserved. No significant valvular disease.  Metoprolol. torsemide Note that patient also take metolazone as needed at home.    Cushing's syndrome, ACTH dependent (HCC) Patient gets corticotropin injections q 10 days.     HLD (hyperlipidemia) Continue with statin therapy    Hypothyroidism Continue with levothyroxine    Depression with anxiety Continue with alprazolam.    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  Patient is wheelchair bound and non ambulatory.   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      DVT prophylaxis: eliquis Code Status: full Family Communication: none Disposition:   Status is: Inpatient Remains inpatient appropriate because: need for continued monitoring   Consultants:  None  Procedures:  none  Antimicrobials:  Anti-infectives (From admission, onward)    Start     Dose/Rate Route Frequency Ordered Stop   08/14/23 1000  cefTRIAXone (ROCEPHIN) 1 g in sodium chloride 0.9 % 100 mL IVPB        1 g 200 mL/hr over 30 Minutes Intravenous Every 24 hours 08/13/23 1655     08/13/23 1530  cefTRIAXone (ROCEPHIN) 1 g in sodium chloride 0.9 % 100 mL IVPB        1 g 200 mL/hr over 30 Minutes Intravenous  Once 08/13/23 1523 08/13/23 1656       Subjective  Didn't think she got her diuretics today C/o tightness in legs  Objective: Vitals:   08/16/23 0320 08/16/23 0523 08/16/23 0630 08/16/23 0819  BP: (!) 94/58 91/67 (!) 103/48 (!) 101/59  Pulse: 69  68 71  Resp: 14  13 20   Temp: (!) 97.3 F (36.3 C)   (!) 97.3 F (36.3 C)  TempSrc: Axillary   Axillary  SpO2: 100%     Weight:      Height:        Intake/Output Summary (Last 24 hours) at 08/16/2023 1748 Last data filed at 08/16/2023 1453 Gross per 24 hour  Intake 200 ml  Output 750 ml  Net -550 ml   Filed Weights   08/13/23 1314 08/14/23 1344 08/15/23 0407  Weight: 88.9 kg 93.7 kg 94.2 kg    Examination:  General exam: Appears calm and comfortable  Respiratory system: unlabored Cardiovascular system: RRR Gastrointestinal system: s/nt/nd Central nervous system: Alert and oriented. No focal neurological  deficits. Extremities: trace LEE   Data Reviewed: I have personally reviewed following labs and imaging studies  CBC: Recent Labs  Lab 08/13/23 1316 08/14/23 0749 08/15/23 0309 08/16/23 0301  WBC 9.2 7.1 8.0 8.0  HGB 13.8 12.5 11.6* 11.9*  HCT 40.2 36.0 34.0* 35.7*  MCV 91.2 91.4 92.4 94.7  PLT 231 173 180 177    Basic Metabolic Panel: Recent Labs  Lab 08/14/23 0749 08/14/23 1349 08/14/23 1943 08/15/23 0309 08/16/23 0301  NA 133* 130* 133* 135 137  K 3.6 3.6 3.5 3.1* 4.9  CL 92* 91* 94* 95* 99  CO2 31 30 34* 33* 28  GLUCOSE 76 144* 83 90 111*  BUN 8 7* 6* 5* 14  CREATININE 0.58 0.58 0.50 0.55 0.54  CALCIUM 8.4* 8.4* 8.6* 8.6* 8.6*  MG  --   --   --   --  2.3    GFR: Estimated Creatinine Clearance: 65.3 mL/min (by C-G formula based on SCr of 0.54 mg/dL).  Liver Function Tests: Recent Labs  Lab 08/13/23 1316  AST 14*  ALT 11  ALKPHOS 64  BILITOT 0.6  PROT 6.5  ALBUMIN 3.4*    CBG: No results for input(s): "GLUCAP" in the last 168 hours.   No results found for this or any previous visit (from the past 240 hours).       Radiology Studies: No results found.      Scheduled Meds:  ALPRAZolam  0.5 mg Oral QHS   apixaban  5 mg Oral BID   buPROPion  150 mg Oral Daily   gabapentin  300 mg Oral QID   hydrocerin  1 Application Topical Daily   lactulose  10 g Oral BID   levothyroxine  75 mcg Oral Q0600   melatonin  5 mg Oral QHS   metoprolol succinate  25 mg Oral Daily   polyethylene glycol  17 g Oral Daily   polyvinyl alcohol  1 drop Both Eyes BID   psyllium  1 packet Oral BID   sodium chloride flush  3 mL Intravenous Q12H   torsemide  30 mg Oral BID   Continuous Infusions:  cefTRIAXone (ROCEPHIN)  IV Stopped (08/16/23 0945)     LOS: 2 days    Time spent: over 30 min    Lacretia Nicks, MD [facility]   To contact the attending provider between 7A-7P or the covering provider during after hours 7P-7A, please log into the  web site [email or web address] and access using universal [place] password for that web site. If you do not have the password, please call the hospital operator.  08/16/2023, 5:48 PM

## 2023-08-16 NOTE — TOC Initial Note (Signed)
Transition of Care [facility]) - Initial/Assessment Note    Patient Details  Name: Toni [name] MRN: [id number] Date of Birth: [birth year]  [facility]) CM/SW Contact:    Eduard Roux, LCSW Phone Number: 08/16/2023, 4:09 PM  Clinical Narrative:                  CSW spoke with patient's daughter, Belenda Cruise. CSW introduced self and explained role. Patient's daughter confirmed she is from [facility]. Family anticipate patient will return once stable. Will need PTAR for transport.   TOC will continue to follow and assist with discharge planning.  Antony Blackbird, MSW, LCSW Clinical Social Worker    Expected Discharge Plan: Skilled Nursing Facility Barriers to Discharge: Continued Medical Work up   Patient Goals and CMS Choice            Expected Discharge Plan and Services In-house Referral: Clinical Social Work     Living arrangements for the past 2 months: Skilled Nursing Facility                                      Prior Living Arrangements/Services Living arrangements for the past 2 months: Skilled Nursing Facility Lives with:: Self Patient language and need for interpreter reviewed:: No        Need for Family Participation in Patient Care: Yes (Comment) Care giver support system in place?: Yes (comment)   Criminal Activity/Legal Involvement Pertinent to Current Situation/Hospitalization: No - Comment as needed  Activities of Daily Living   ADL Screening (condition at time of admission) Independently performs ADLs?: No Does the patient have a NEW difficulty with bathing/dressing/toileting/self-feeding that is expected to last >3 days?: No Does the patient have a NEW difficulty with getting in/out of bed, walking, or climbing stairs that is expected to last >3 days?: No Does the patient have a NEW difficulty with communication that is expected to last >3 days?: No Is the patient deaf or have difficulty hearing?: No Does the  patient have difficulty seeing, even when wearing glasses/contacts?: No Does the patient have difficulty concentrating, remembering, or making decisions?: No  Permission Sought/Granted Permission sought to share information with : Family Supports                Emotional Assessment       Orientation: : Oriented to Self, Oriented to Place, Oriented to  Time, Oriented to Situation Alcohol / Substance Use: Not Applicable Psych Involvement: No (comment)  Admission diagnosis:  Hyponatremia [E87.1] Urinary tract infection associated with indwelling urethral catheter, initial encounter (HCC) [W09.811B, N39.0] Patient Active Problem List   Diagnosis Date Noted   Hypokalemia 06/14/2023   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 02/21/2023   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/25/2022   Wheelchair dependent 12/25/2022   Pressure injury of skin 11/21/2022   (HFpEF) heart failure with preserved ejection fraction (HCC) 07/14/2022   Hyponatremia 07/13/2022   Cushing's syndrome, ACTH dependent (HCC) 09/12/2021   Presence of intrathecal pump 08/18/2021   Spasticity 02/18/2021   Bladder spasms 01/04/2021   UTI (urinary tract infection) 01/03/2021   Bilateral carpal tunnel syndrome 10/04/2020   Chronic diastolic CHF (congestive heart failure) (HCC) 09/13/2020   Uncomplicated degenerative myopia of both eyes 03/19/2020   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/2020   Oxygen dependent 02/26/2020   Neurogenic hypoventilation 02/26/2020   Complex atypical endometrial hyperplasia    Acute on  chronic respiratory failure with hypoxia (HCC) 10/10/2019   Multiple sclerosis (HCC) 10/10/2019   Bilateral leg weakness 08/04/2019   Weakness 08/04/2019   Unilateral primary osteoarthritis, left knee 01/09/2019   Obstructive sleep apnea treated with bilevel positive airway pressure (BiPAP) 08/15/2018   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) 11/21/2017    Permanent atrial fibrillation (HCC) 11/21/2017   Tightness of leg fascia 11/21/2017   Muscle spasm of both lower legs 11/21/2017   Carpal tunnel syndrome, left upper limb 09/13/2017   Carpal tunnel syndrome, right upper limb 09/13/2017   Other insomnia 06/11/2017   Depression with anxiety 06/11/2017   Vitamin D deficiency 03/22/2017   Right hand weakness 08/06/2016   Nonischemic cardiomyopathy (HCC) 05/09/2016   Localized edema 05/09/2016   Long term current use of anticoagulant therapy 02/03/2016   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   Obesity hypoventilation syndrome (HCC) 03/30/2015   Abnormality of gait 03/01/2015   Other constipation 01/25/2015   ACTH dependent Cushing's syndrome (HCC) 01/25/2015   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) 01/25/2015   Neurogenic urinary bladder disorder 08/24/2014   Varicose veins of both lower extremities with complications 06/04/2013   Pseudophakia of both eyes 02/07/2012   Multiple sclerosis, 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 11/26/2011   Hypothyroidism    HLD (hyperlipidemia)    PCP:  Sherron Monday, MD Pharmacy:   [facility] - [place], [place] - [address] [place] [address] Phone: [phone] Fax: [phone]     Social Drivers of Health (SDOH) Social History: SDOH Screenings   Food Insecurity: No Food Insecurity (08/13/2023)  Housing: Low Risk  (08/13/2023)  Transportation Needs: No Transportation Needs (08/13/2023)  Utilities: Not At Risk (08/13/2023)  Depression (PHQ2-9): Low Risk  (08/06/2018)  Tobacco Use: Medium Risk (08/13/2023)   SDOH Interventions:     Readmission Risk Interventions     No data to display

## 2023-08-17 DIAGNOSIS — E871 Hypo-osmolality and hyponatremia: Secondary | ICD-10-CM | POA: Diagnosis not present

## 2023-08-17 LAB — CBC WITH DIFFERENTIAL/PLATELET
Abs Immature Granulocytes: 0.02 10*3/uL (ref 0.00–0.07)
Basophils Absolute: 0.1 10*3/uL (ref 0.0–0.1)
Basophils Relative: 1 %
Eosinophils Absolute: 0.4 10*3/uL (ref 0.0–0.5)
Eosinophils Relative: 5 %
HCT: 37.2 % (ref 36.0–46.0)
Hemoglobin: 12.5 g/dL (ref 12.0–15.0)
Immature Granulocytes: 0 %
Lymphocytes Relative: 43 %
Lymphs Abs: 3.5 10*3/uL (ref 0.7–4.0)
MCH: 31.6 pg (ref 26.0–34.0)
MCHC: 33.6 g/dL (ref 30.0–36.0)
MCV: 94.2 fL (ref 80.0–100.0)
Monocytes Absolute: 0.7 10*3/uL (ref 0.1–1.0)
Monocytes Relative: 9 %
Neutro Abs: 3.4 10*3/uL (ref 1.7–7.7)
Neutrophils Relative %: 42 %
Platelets: 205 10*3/uL (ref 150–400)
RBC: 3.95 MIL/uL (ref 3.87–5.11)
RDW: 12.7 % (ref 11.5–15.5)
WBC: 8.1 10*3/uL (ref 4.0–10.5)
nRBC: 0 % (ref 0.0–0.2)

## 2023-08-17 LAB — COMPREHENSIVE METABOLIC PANEL
ALT: 9 U/L (ref 0–44)
AST: 14 U/L — ABNORMAL LOW (ref 15–41)
Albumin: 2.9 g/dL — ABNORMAL LOW (ref 3.5–5.0)
Alkaline Phosphatase: 49 U/L (ref 38–126)
Anion gap: 9 (ref 5–15)
BUN: 20 mg/dL (ref 8–23)
CO2: 34 mmol/L — ABNORMAL HIGH (ref 22–32)
Calcium: 8.6 mg/dL — ABNORMAL LOW (ref 8.9–10.3)
Chloride: 95 mmol/L — ABNORMAL LOW (ref 98–111)
Creatinine, Ser: 0.56 mg/dL (ref 0.44–1.00)
GFR, Estimated: >60 mL/min (ref >60)
Glucose, Bld: 87 mg/dL (ref 70–99)
Potassium: 3.2 mmol/L — ABNORMAL LOW (ref 3.5–5.1)
Sodium: 138 mmol/L (ref 135–145)
Total Bilirubin: 0.4 mg/dL (ref <1.2)
Total Protein: 5.5 g/dL — ABNORMAL LOW (ref 6.5–8.1)

## 2023-08-17 LAB — PHOSPHORUS: Phosphorus: 4.3 mg/dL (ref 2.5–4.6)

## 2023-08-17 LAB — MAGNESIUM: Magnesium: 2 mg/dL (ref 1.7–2.4)

## 2023-08-17 MED ORDER — POTASSIUM CHLORIDE CRYS ER 20 MEQ PO TBCR
40.0000 meq | EXTENDED_RELEASE_TABLET | ORAL | Status: AC
  Administered 2023-08-17 (×2): 40 meq via ORAL
  Filled 2023-08-17 (×2): qty 2

## 2023-08-17 NOTE — Evaluation (Signed)
Occupational Therapy Evaluation Patient Details Name: Toni [name] MRN: [id number] DOB: 1946-03-01 Today's Date: 08/17/2023   History of Present Illness 77 yo female admitted  with UTI, hyponatremia, hypokalemia from [facility]. PMHx: PAF, HFpEF, Multiple sclerosis, CA, cervical fusion, HTN, HLD, depression   Clinical Impression   Pt in good spirits, feeling better, somewhat fatigued/weak, c/o B foot pain. Pt from [facility], plans to return, has help for ADLs/transfers at baseline. Pt currently at baseline function, mod A for bed mobility, set up to min A for UB ADLs, max-total for LB ADLs. Pt has no current acute care needs, has all assistance and DME upon return to SNF.       If plan is discharge home, recommend the following: A lot of help with walking and/or transfers;A lot of help with bathing/dressing/bathroom;Assist for transportation    Functional Status Assessment  Patient has had a recent decline in their functional status and demonstrates the ability to make significant improvements in function in a reasonable and predictable amount of time.  Equipment Recommendations  None recommended by OT    Recommendations for Other Services       Precautions / Restrictions Precautions Precautions: Fall Restrictions Weight Bearing Restrictions Per Provider Order: No      Mobility Bed Mobility Overal bed mobility: Needs Assistance Bed Mobility: Supine to Sit, Sit to Supine     Supine to sit: Mod assist Sit to supine: Mod assist   General bed mobility comments: mod A in/out of bed    Transfers Overall transfer level: Needs assistance Equipment used: Ambulation equipment used               General transfer comment: at baseline uses standing lift      Balance Overall balance assessment: Needs assistance   Sitting balance-Leahy Scale: Fair Sitting balance - Comments: EOB, able to balance without UE support     Standing balance-Leahy  Scale: Zero Standing balance comment: uses mechanical lift to stand                           ADL either performed or assessed with clinical judgement   ADL Overall ADL's : Needs assistance/impaired;At baseline Eating/Feeding: Set up   Grooming: Set up   Upper Body Bathing: Moderate assistance;Sitting   Lower Body Bathing: Total assistance;Sitting/lateral leans   Upper Body Dressing : Set up;Sitting   Lower Body Dressing: Total assistance;Bed level   Toilet Transfer: Total assistance;BSC/3in1   Toileting- Clothing Manipulation and Hygiene: Maximal assistance;Sitting/lateral lean         General ADL Comments: set up to min A for UB ADLs, max to total A for LB ADLs, set up and mod I for feeding sitting, total for transfers.     Vision Baseline Vision/History: 1 Wears glasses Ability to See in Adequate Light: 0 Adequate Patient Visual Report: No change from baseline       Perception         Praxis         Pertinent Vitals/Pain Pain Assessment Pain Assessment: 0-10 Pain Score: 5  Pain Location: B feet Pain Descriptors / Indicators: Aching, Discomfort Pain Intervention(s): Monitored during session     Extremity/Trunk Assessment Upper Extremity Assessment Upper Extremity Assessment: Generalized weakness   Lower Extremity Assessment Lower Extremity Assessment: Defer to PT evaluation       Communication Communication Communication: No apparent difficulties   Cognition Arousal: Alert Behavior During Therapy: [facility] for  tasks assessed/performed Overall Cognitive Status: Within Functional Limits for tasks assessed                                       General Comments       Exercises     Shoulder Instructions      Home Living Family/patient expects to be discharged to:: Skilled nursing facility                                 Additional Comments: [facility] for 2.5 years      Prior  Functioning/Environment Prior Level of Function : Needs assist             Mobility Comments: uses mechanical lift to transfer to w/c or recliner ADLs Comments: Pt requires assistance for all ADLs, set up-min A for UB ADLs, max-total with LB ADLs, uses built up silverware for OT.        OT Problem List: Decreased strength;Decreased activity tolerance;Pain      OT Treatment/Interventions:      OT Goals(Current goals can be found in the care plan section) Acute Rehab OT Goals Patient Stated Goal: to return to [facility] OT Goal Formulation: With patient Time For Goal Achievement: 08/31/23 Potential to Achieve Goals: Good  OT Frequency:      Co-evaluation              AM-PAC OT "6 Clicks" Daily Activity     Outcome Measure Help from another person eating meals?: A Little Help from another person taking care of personal grooming?: A Little Help from another person toileting, which includes using toliet, bedpan, or urinal?: Total Help from another person bathing (including washing, rinsing, drying)?: A Lot Help from another person to put on and taking off regular upper body clothing?: A Little Help from another person to put on and taking off regular lower body clothing?: Total 6 Click Score: 13   End of Session Nurse Communication: Mobility status  Activity Tolerance: Patient tolerated treatment well Patient left: in bed;with call bell/phone within reach;with family/visitor present  OT Visit Diagnosis: Other abnormalities of gait and mobility (R26.89);Muscle weakness (generalized) (M62.81);Pain Pain - part of body: Ankle and joints of foot                Time: 1132-1157 OT Time Calculation (min): 25 min Charges:  OT General Charges $OT Visit: 1 Visit OT Evaluation $OT Eval Low Complexity: 1 Low OT Treatments $Self Care/Home Management : 8-22 mins  [place]ang, OTR/L   Alexis Goodell 08/17/2023, 12:00 PM

## 2023-08-17 NOTE — TOC Progression Note (Addendum)
Transition of Care [facility]) - Progression Note    Patient Details  Name: Toni [name] MRN: [id number] Date of Birth: 10-24-1945  Transition of Care [facility]) CM/SW Contact  Eduard Roux, [place] Phone Number: 08/17/2023, 2:32 PM  Clinical Narrative:     Informed  [facility]/ Admission- anticipate d/c tomorrow -   Antony Blackbird, MSW, LCSW Clinical Social Worker    Expected Discharge Plan: Skilled Nursing Facility Barriers to Discharge: Continued Medical Work up  Expected Discharge Plan and Services In-house Referral: Clinical Social Work     Living arrangements for the past 2 months: Skilled Nursing Facility                                       Social Determinants of Health (SDOH) Interventions SDOH Screenings   Food Insecurity: No Food Insecurity (08/13/2023)  Housing: Low Risk  (08/13/2023)  Transportation Needs: No Transportation Needs (08/13/2023)  Utilities: Not At Risk (08/13/2023)  Depression (PHQ2-9): Low Risk  (08/06/2018)  Tobacco Use: Medium Risk (08/13/2023)    Readmission Risk Interventions     No data to display

## 2023-08-17 NOTE — Progress Notes (Signed)
PROGRESS NOTE    Toni [name]  ZOX:[id number] DOB: November 28, 1945 DOA: 08/13/2023 PCP: Sherron Monday, MD  Chief Complaint  Patient presents with   Abdominal Pain    Brief Narrative:   Toni [name] was admitted to the hospital with the working diagnosis of urinary tract infection complicated with ileus, dehydration, hypokalemia and hyponatremia.    77 y.o. 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  Continue anticoagulation with apixaban.  Continue telemetry monitoring.    Chronic diastolic CHF (congestive heart failure) (HCC) Echocardiogram with mild reduction in LV systolic function EF 45 to 50%, global hypokinesis, RV systolic function preserved. No significant valvular disease.  Metoprolol. torsemide Note that patient also take metolazone as needed at home.    Cushing's syndrome, ACTH dependent (HCC) Patient gets corticotropin injections q 10 days.     HLD (hyperlipidemia) Continue with statin therapy    Hypothyroidism Continue with levothyroxine    Depression with anxiety Continue with alprazolam.    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  Patient is wheelchair bound and non ambulatory.   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      DVT prophylaxis: eliquis Code Status: full Family Communication: none Disposition:   Status is: Inpatient Remains inpatient appropriate because: need for continued monitoring   Consultants:  None  Procedures:  none  Antimicrobials:  Anti-infectives (From admission, onward)    Start     Dose/Rate Route Frequency Ordered Stop   08/14/23 1000  cefTRIAXone (ROCEPHIN) 1 g in sodium chloride 0.9 % 100 mL IVPB        1 g 200 mL/hr over 30 Minutes Intravenous Every 24 hours 08/13/23 1655 08/17/23 1157   08/13/23 1530  cefTRIAXone (ROCEPHIN) 1 g in sodium chloride 0.9 % 100 mL IVPB        1 g 200 mL/hr over 30 Minutes Intravenous  Once 08/13/23 1523 08/13/23 1656       Subjective  C/o fatigue   Objective: Vitals:   08/17/23 0347 08/17/23 1112 08/17/23 1133 08/17/23 1315  BP: (!) 100/52 (!) 85/59 (!) 85/59 (!) 112/55  Pulse: 82 87 88 75  Resp: 11 18  19   Temp: 97.6 F (36.4 C) 97.8 F (36.6 C)    TempSrc: Oral Oral    SpO2: 100% 97%  98%  Weight:      Height:        Intake/Output Summary (Last 24 hours) at 08/17/2023 1414 Last data filed at 08/17/2023 1127 Gross per 24 hour  Intake 580 ml  Output 4100 ml  Net -3520 ml   Filed Weights   08/13/23 1314 08/14/23 1344 08/15/23 0407  Weight: 88.9 kg 93.7 kg 94.2 kg    Examination:  General: No acute distress. Cardiovascular: RRR Lungs: unlabored Neurological: Alert and oriented 3. Moves all extremities 4 with equal strength. Cranial nerves II through XII grossly intact. Extremities: trace LE edema    Data Reviewed: I have  personally reviewed following labs and imaging studies  CBC: Recent Labs  Lab 08/13/23 1316 08/14/23 0749 08/15/23 0309 08/16/23 0301 08/17/23 0333  WBC 9.2 7.1 8.0 8.0 8.1  NEUTROABS  --   --   --   --  3.4  HGB 13.8 12.5 11.6* 11.9* 12.5  HCT 40.2 36.0 34.0* 35.7* 37.2  MCV 91.2 91.4 92.4 94.7 94.2  PLT 231 173 180 177 205    Basic Metabolic Panel: Recent Labs  Lab 08/14/23 1349 08/14/23 1943 08/15/23 0309 08/16/23 0301 08/17/23 0333  NA 130* 133* 135 137 138  K 3.6 3.5 3.1* 4.9 3.2*  CL 91* 94* 95* 99 95*  CO2 30 34* 33* 28 34*  GLUCOSE 144* 83 90 111* 87  BUN 7* 6* 5* 14 20  CREATININE 0.58 0.50 0.55 0.54 0.56  CALCIUM 8.4* 8.6* 8.6* 8.6* 8.6*  MG  --   --   --  2.3 2.0  PHOS  --   --   --   --  4.3    GFR: Estimated Creatinine Clearance: 65.3 mL/min (by C-G formula based on SCr of 0.56 mg/dL).  Liver Function Tests: Recent Labs  Lab 08/13/23 1316 08/17/23 0333  AST 14* 14*  ALT 11 9  ALKPHOS 64 49  BILITOT 0.6 0.4  PROT 6.5 5.5*  ALBUMIN 3.4* 2.9*    CBG: No results for input(s): "GLUCAP" in the last 168 hours.   No results found for this or any previous visit (from the past 240 hours).       Radiology Studies: No results found.      Scheduled Meds:  ALPRAZolam  0.5 mg Oral QHS   apixaban  5 mg Oral BID   buPROPion  150 mg Oral Daily   gabapentin  300 mg Oral QID   hydrocerin  1 Application Topical Daily   lactulose  10 g Oral BID   levothyroxine  75 mcg Oral Q0600   melatonin  5 mg Oral QHS   metoprolol succinate  25 mg Oral Daily   polyethylene glycol  17 g Oral Daily   polyvinyl alcohol  1 drop Both Eyes BID   potassium chloride  40 mEq Oral Q4H   psyllium  1 packet Oral BID   sodium chloride flush  3 mL Intravenous Q12H   torsemide  30 mg Oral BID   Continuous Infusions:     LOS: 3 days    Time spent: over 30 min    Lacretia Nicks, MD [facility]   To contact the attending provider between 7A-7P or  the covering provider during after hours 7P-7A, please log into the web site [email or web address] and access using universal [place] password for that web site. If you do not have the password, please call the hospital operator.  08/17/2023, 2:14 PM

## 2023-08-17 NOTE — [place] FL2 (Signed)
[place] MEDICAID FL2 LEVEL OF CARE FORM     IDENTIFICATION  Patient Name: Toni [name] Birthdate: 07-21-1946 Sex: female Admission Date (Current Location): 08/13/2023  [facility] and [place] Number:  Producer, television/film/video and Address:  [facility], 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 02/21/2023   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/25/2022   Wheelchair dependent 12/25/2022   Pressure injury of skin 11/21/2022   (HFpEF) heart failure with preserved ejection fraction (HCC) 07/14/2022   Hyponatremia 07/13/2022   Cushing's syndrome, ACTH dependent (HCC) 09/12/2021   Presence of intrathecal pump 08/18/2021   Spasticity 02/18/2021   Bladder spasms 01/04/2021   UTI (urinary tract infection) 01/03/2021   Bilateral carpal tunnel syndrome 10/04/2020   Chronic diastolic CHF (congestive heart failure) (HCC) 09/13/2020   Uncomplicated degenerative myopia of both eyes 03/19/2020   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/2020   Oxygen dependent 02/26/2020   Neurogenic hypoventilation 02/26/2020   Complex atypical endometrial hyperplasia    Acute on chronic respiratory failure with hypoxia (HCC) 10/10/2019   Multiple sclerosis (HCC) 10/10/2019   Bilateral leg weakness 08/04/2019   Weakness 08/04/2019   Unilateral primary osteoarthritis, left knee 01/09/2019   Obstructive sleep apnea treated with bilevel positive airway pressure (BiPAP) 08/15/2018   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) 11/21/2017   Permanent atrial fibrillation (HCC) 11/21/2017   Tightness of leg fascia 11/21/2017   Muscle spasm of both lower legs 11/21/2017   Carpal tunnel syndrome, left upper limb 09/13/2017   Carpal tunnel syndrome, right upper limb 09/13/2017   Other insomnia 06/11/2017   Depression with anxiety 06/11/2017   Vitamin D deficiency 03/22/2017   Right hand weakness 08/06/2016   Nonischemic cardiomyopathy (HCC) 05/09/2016   Localized edema 05/09/2016   Long term current use of anticoagulant therapy 02/03/2016   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   Obesity hypoventilation syndrome (HCC) 03/30/2015   Abnormality of gait 03/01/2015   Other constipation 01/25/2015   ACTH dependent Cushing's syndrome (HCC) 01/25/2015   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) 01/25/2015   Neurogenic urinary bladder disorder 08/24/2014   Varicose veins of both lower extremities with complications 06/04/2013   Pseudophakia of both eyes 02/07/2012   Multiple sclerosis, 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 11/26/2011   Hypothyroidism    HLD (hyperlipidemia)     Orientation RESPIRATION BLADDER Height & Weight     Self, Time, Situation, Place  Normal External catheter, Incontinent Weight: 207 lb 10.8 oz (94.2 kg) Height:  5\' 3"  (160 cm)  BEHAVIORAL SYMPTOMS/MOOD NEUROLOGICAL BOWEL NUTRITION STATUS      Continent Diet (please see discharge summary)  AMBULATORY STATUS COMMUNICATION OF NEEDS Skin   Limited Assist Verbally Normal                       Personal Care Assistance Level of Assistance  Bathing, Feeding, Dressing Bathing Assistance: Limited assistance  Feeding assistance: Independent Dressing Assistance: Limited assistance     Functional Limitations Info  Sight, Hearing, Speech Sight Info: Adequate (glasses) Hearing Info: Adequate Speech Info: Adequate    SPECIAL CARE  FACTORS FREQUENCY  OT (By licensed OT), PT (By licensed PT)     PT Frequency: 5x per week OT Frequency: 5x per wek            Contractures Contractures Info: Not present    Additional Factors Info  Code Status, Allergies, Isolation Precautions Code Status Info: FULL Allergies Info: Hydrocodone,Oxycontin,Zoloft ,Chloraseptic,Macrobid,Vioxx,Hibiclens     Isolation Precautions Info: ESBL     Current Medications (08/17/2023):  This is the current hospital active medication list Current Facility-Administered Medications  Medication Dose Route Frequency Provider Last Rate Last Admin   acetaminophen (TYLENOL) tablet 650 mg  650 mg Oral Q6H PRN Synetta Fail, MD       Or   acetaminophen (TYLENOL) suppository 650 mg  650 mg Rectal Q6H PRN Synetta Fail, MD       ALPRAZolam Prudy Feeler) tablet 0.5 mg  0.5 mg Oral QHS Synetta Fail, MD   0.5 mg at 08/16/23 2212   apixaban (ELIQUIS) tablet 5 mg  5 mg Oral BID Synetta Fail, MD   5 mg at 08/17/23 1116   buPROPion (WELLBUTRIN XL) 24 hr tablet 150 mg  150 mg Oral Daily Synetta Fail, MD   150 mg at 08/17/23 1116   cefTRIAXone (ROCEPHIN) 1 g in sodium chloride 0.9 % 100 mL IVPB  1 g Intravenous Q24H Zigmund Daniel., MD 200 mL/hr at 08/17/23 1127 1 g at 08/17/23 1127   diphenhydrAMINE (BENADRYL) capsule 25 mg  25 mg Oral TID PRN Synetta Fail, MD       gabapentin (NEURONTIN) capsule 300 mg  300 mg Oral QID Steffanie Rainwater, MD   300 mg at 08/17/23 1117   hydrocerin (EUCERIN) cream 1 Application  1 Application Topical Daily Synetta Fail, MD   1 Application at 08/17/23 1118   lactulose (CHRONULAC) 10 GM/15ML solution 10 g  10 g Oral BID Synetta Fail, MD   10 g at 08/17/23 1114   levothyroxine (SYNTHROID) tablet 75 mcg  75 mcg Oral Q0600 Synetta Fail, MD   75 mcg at 08/17/23 0981   melatonin tablet 5 mg  5 mg Oral QHS Synetta Fail, MD   5 mg at 08/16/23 2212   metoprolol succinate  (TOPROL-XL) 24 hr tablet 25 mg  25 mg Oral Daily Arrien, York Ram, MD   25 mg at 08/15/23 1355   midodrine (PROAMATINE) tablet 5 mg  5 mg Oral TID PRN Synetta Fail, MD   5 mg at 08/16/23 0937   ondansetron [facility]) injection 4 mg  4 mg Intravenous Q6H PRN Steffanie Rainwater, MD   4 mg at 08/14/23 1338   Or   ondansetron (ZOFRAN) tablet 4 mg  4 mg Oral Q6H PRN Steffanie Rainwater, MD       oxybutynin (DITROPAN) tablet 5 mg  5 mg Oral BID PRN Synetta Fail, MD       phenazopyridine (PYRIDIUM) tablet 100 mg  100 mg Oral Q8H PRN Synetta Fail, MD       polyethylene glycol (MIRALAX / GLYCOLAX) packet 17 g  17 g Oral Daily Synetta Fail, MD   17 g at 08/15/23 1914   polyvinyl alcohol (LIQUIFILM TEARS) 1.4 % ophthalmic solution 1 drop  1 drop Both Eyes BID Synetta Fail, MD   1 drop at 08/16/23 2213   potassium chloride SA (KLOR-CON M) CR tablet 40 mEq  40 mEq Oral Q4H Zigmund Daniel., MD   40 mEq at 08/17/23 1115   psyllium (HYDROCIL/METAMUCIL) 1 packet  1 packet Oral BID Synetta Fail, MD   1 packet at 08/16/23 2212   sodium chloride flush (NS) 0.9 % injection 3 mL  3 mL Intravenous Q12H Synetta Fail, MD   3 mL at 08/17/23 1119   sodium phosphate (FLEET) enema 1 enema  1 enema Rectal Daily PRN Synetta Fail, MD   1 enema at 08/14/23 2041   torsemide [facility]) tablet 30 mg  30 mg Oral BID Coralie Keens, MD   30 mg at 08/17/23 1116     Discharge Medications: Please see discharge summary for a list of discharge medications.  Relevant Imaging Results:  Relevant Lab Results:   Additional Information SSN [id number]  Eduard Roux, LCSW

## 2023-08-17 NOTE — Evaluation (Signed)
Physical Therapy Evaluation Patient Details Name: Toni [name] MRN: [id number] DOB: 1946-02-03 Today's Date: 08/17/2023  History of Present Illness  77 yo female admitted  with UTI, hyponatremia, hypokalemia from [facility] ALF. PMHx: PAF, HFpEF, Multiple sclerosis, CA, cervical fusion, HTN, HLD, depression   Clinical Impression  Pt admitted with above diagnosis. PTA pt resided at [facility] ALF/SNF. She transferred to/from bed via Sarah standing lift and self propelled short distances in wheelchair. Pt currently with functional limitations due to the deficits listed below (see PT Problem List). On eval, pt required mod assist bed mobility and demo fair sitting balance EOB. Generalized weakness noted BUE/LE. Pt will benefit from acute skilled PT to increase their independence and safety with mobility to allow discharge. Upon d/c, pt would benefit from further therapy in inpatient setting < 3 hours/day.          If plan is discharge home, recommend the following:     Can travel by private vehicle   No    Equipment Recommendations None recommended by PT  Recommendations for Other Services       Functional Status Assessment       Precautions / Restrictions Precautions Precautions: Fall;Other (comment) Precaution Comments: chronic foley Restrictions Weight Bearing Restrictions Per Provider Order: No      Mobility  Bed Mobility Overal bed mobility: Needs Assistance Bed Mobility: Supine to Sit, Sit to Supine     Supine to sit: Mod assist, Used rails, HOB elevated Sit to supine: Mod assist, Used rails   General bed mobility comments: assist with RLE and trunk, +2 total to scoot up in bed using bed pad    Transfers                        Ambulation/Gait               General Gait Details: nonambulatory at baseline  Stairs            Wheelchair Mobility     Tilt Bed    Modified Rankin (Stroke Patients Only)       Balance  Overall balance assessment: Needs assistance Sitting-balance support: Feet unsupported, No upper extremity supported, Bilateral upper extremity supported Sitting balance-Leahy Scale: Fair Sitting balance - Comments: EOB, able to balance without UE support                                     Pertinent Vitals/Pain Pain Assessment Pain Assessment: Faces Faces Pain Scale: Hurts little more Pain Location: B feet Pain Descriptors / Indicators: Discomfort Pain Intervention(s): Monitored during session    Home Living Family/patient expects to be discharged to:: Skilled nursing facility                   Additional Comments: [facility] for 2.5 years    Prior Function Prior Level of Function : Needs assist             Mobility Comments: sarah standing lift for transfers, able to self propel short distances in w/c ADLs Comments: Pt requires assistance for all ADLs, set up-min A for UB ADLs, max-total with LB ADLs, uses built up silverware for OT.     Extremity/Trunk Assessment   Upper Extremity Assessment Upper Extremity Assessment: Defer to OT evaluation    Lower Extremity Assessment Lower Extremity Assessment: Generalized weakness;RLE deficits/detail;LLE deficits/detail RLE Deficits / Details: flexible  foot drop bilat, weakness R>L, edema R>L LLE Deficits / Details: flexible foot drop bilat, weakness R>L, edema R>L    Cervical / Trunk Assessment Cervical / Trunk Assessment: Kyphotic  Communication   Communication Communication: No apparent difficulties  Cognition Arousal: Alert Behavior During Therapy: WFL for tasks assessed/performed Overall Cognitive Status: Within Functional Limits for tasks assessed                                 General Comments: very witty        General Comments General comments (skin integrity, edema, etc.): erratic HR with brief increase into 150s during transition to EOB. Primarily ranged  90s-110s. SpO2 stable on RA.    Exercises     Assessment/Plan    PT Assessment Patient needs continued PT services  PT Problem List Decreased strength;Decreased balance;Decreased mobility;Decreased activity tolerance       PT Treatment Interventions Functional mobility training;Balance training;Patient/family education;Therapeutic activities;Therapeutic exercise    PT Goals (Current goals can be found in the Care Plan section)  Acute Rehab PT Goals Patient Stated Goal: rehab at [facility] PT Goal Formulation: With patient Time For Goal Achievement: 08/31/23 Potential to Achieve Goals: Fair    Frequency Min 1X/week     Co-evaluation               AM-PAC PT "6 Clicks" Mobility  Outcome Measure Help needed turning from your back to your side while in a flat bed without using bedrails?: A Lot Help needed moving from lying on your back to sitting on the side of a flat bed without using bedrails?: A Lot Help needed moving to and from a bed to a chair (including a wheelchair)?: Total Help needed standing up from a chair using your arms (e.g., wheelchair or bedside chair)?: Total Help needed to walk in hospital room?: Total Help needed climbing 3-5 steps with a railing? : Total 6 Click Score: 8    End of Session   Activity Tolerance: Patient tolerated treatment well Patient left: in bed;with call bell/phone within reach;with family/visitor present Nurse Communication: Mobility status PT Visit Diagnosis: Other abnormalities of gait and mobility (R26.89);Muscle weakness (generalized) (M62.81)    Time: 1016-1040 PT Time Calculation (min) (ACUTE ONLY): 24 min   Charges:   PT Evaluation $PT Eval Moderate Complexity: 1 Mod PT Treatments $Therapeutic Activity: 8-22 mins PT General Charges $$ ACUTE PT VISIT: 1 Visit         Ferd Glassing., PT  Office # [phone]   Ilda Foil 08/17/2023, 12:39 PM

## 2023-08-18 DIAGNOSIS — E871 Hypo-osmolality and hyponatremia: Secondary | ICD-10-CM | POA: Diagnosis not present

## 2023-08-18 LAB — COMPREHENSIVE METABOLIC PANEL
ALT: 9 U/L (ref 0–44)
AST: 13 U/L — ABNORMAL LOW (ref 15–41)
Albumin: 2.9 g/dL — ABNORMAL LOW (ref 3.5–5.0)
Alkaline Phosphatase: 50 U/L (ref 38–126)
Anion gap: 10 (ref 5–15)
BUN: 24 mg/dL — ABNORMAL HIGH (ref 8–23)
CO2: 33 mmol/L — ABNORMAL HIGH (ref 22–32)
Calcium: 8.8 mg/dL — ABNORMAL LOW (ref 8.9–10.3)
Chloride: 95 mmol/L — ABNORMAL LOW (ref 98–111)
Creatinine, Ser: 0.69 mg/dL (ref 0.44–1.00)
GFR, Estimated: >60 mL/min (ref >60)
Glucose, Bld: 97 mg/dL (ref 70–99)
Potassium: 4.1 mmol/L (ref 3.5–5.1)
Sodium: 138 mmol/L (ref 135–145)
Total Bilirubin: 0.5 mg/dL (ref <1.2)
Total Protein: 5.8 g/dL — ABNORMAL LOW (ref 6.5–8.1)

## 2023-08-18 LAB — CBC WITH DIFFERENTIAL/PLATELET
Abs Immature Granulocytes: 0.01 10*3/uL (ref 0.00–0.07)
Basophils Absolute: 0.1 10*3/uL (ref 0.0–0.1)
Basophils Relative: 1 %
Eosinophils Absolute: 0.4 10*3/uL (ref 0.0–0.5)
Eosinophils Relative: 5 %
HCT: 38.6 % (ref 36.0–46.0)
Hemoglobin: 13 g/dL (ref 12.0–15.0)
Immature Granulocytes: 0 %
Lymphocytes Relative: 39 %
Lymphs Abs: 3.2 10*3/uL (ref 0.7–4.0)
MCH: 31.6 pg (ref 26.0–34.0)
MCHC: 33.7 g/dL (ref 30.0–36.0)
MCV: 93.7 fL (ref 80.0–100.0)
Monocytes Absolute: 0.9 10*3/uL (ref 0.1–1.0)
Monocytes Relative: 11 %
Neutro Abs: 3.6 10*3/uL (ref 1.7–7.7)
Neutrophils Relative %: 44 %
Platelets: 189 10*3/uL (ref 150–400)
RBC: 4.12 MIL/uL (ref 3.87–5.11)
RDW: 12.8 % (ref 11.5–15.5)
WBC: 8.1 10*3/uL (ref 4.0–10.5)
nRBC: 0 % (ref 0.0–0.2)

## 2023-08-18 LAB — MAGNESIUM: Magnesium: 2 mg/dL (ref 1.7–2.4)

## 2023-08-18 LAB — PHOSPHORUS: Phosphorus: 4.3 mg/dL (ref 2.5–4.6)

## 2023-08-18 MED ORDER — METOPROLOL TARTRATE 5 MG/5ML IV SOLN: 2.5000 mg | INTRAVENOUS | Status: DC | PRN

## 2023-08-18 MED ORDER — METOPROLOL TARTRATE 25 MG PO TABS
25.0000 mg | ORAL_TABLET | Freq: Two times a day (BID) | ORAL | Status: DC
Start: 2023-08-18 — End: 2023-08-19
  Administered 2023-08-18: 25 mg via ORAL
  Filled 2023-08-18 (×2): qty 1

## 2023-08-18 NOTE — Plan of Care (Signed)

## 2023-08-18 NOTE — Progress Notes (Signed)
   08/18/23 0133  BiPAP/CPAP/SIPAP  BiPAP/CPAP/SIPAP Pt Type Adult  BiPAP/CPAP/SIPAP Resmed  Patient Home Equipment Yes  Safety Check Completed by RT for Home Unit Yes, no issues noted   Pt resting comfortably on home CPAP.

## 2023-08-18 NOTE — Plan of Care (Signed)
  Problem: Education: Goal: Knowledge of General Education information will improve Description: Including pain rating scale, medication(s)/side effects and non-pharmacologic comfort measures 08/18/2023 0151 by Charmian Muff, RN Outcome: Progressing 08/18/2023 0031 by Charmian Muff, RN Outcome: Progressing 08/18/2023 0029 by Charmian Muff, RN Outcome: Progressing   Problem: Health Behavior/Discharge Planning: Goal: Ability to manage health-related needs will improve 08/18/2023 0151 by Charmian Muff, RN Outcome: Progressing 08/18/2023 0031 by Charmian Muff, RN Outcome: Progressing 08/18/2023 0029 by Charmian Muff, RN Outcome: Progressing   Problem: Clinical Measurements: Goal: Ability to maintain clinical measurements within normal limits will improve 08/18/2023 0151 by Charmian Muff, RN Outcome: Progressing 08/18/2023 0031 by Charmian Muff, RN Outcome: Progressing 08/18/2023 0029 by Charmian Muff, RN Outcome: Progressing Goal: Will remain free from infection 08/18/2023 0151 by Charmian Muff, RN Outcome: Progressing 08/18/2023 0031 by Charmian Muff, RN Outcome: Progressing   Problem: Clinical Measurements: Goal: Ability to maintain clinical measurements within normal limits will improve 08/18/2023 0151 by Charmian Muff, RN Outcome: Progressing 08/18/2023 0031 by Charmian Muff, RN Outcome: Progressing 08/18/2023 0029 by Charmian Muff, RN Outcome: Progressing

## 2023-08-18 NOTE — Progress Notes (Signed)
PROGRESS NOTE    Toni [name]  XBJ:[id number] DOB: 02-07-1946 DOA: 08/13/2023 PCP: Sherron Monday, MD  Chief Complaint  Patient presents with   Abdominal Pain    Brief Narrative:   Toni [name] was admitted to the hospital with the working diagnosis of urinary tract infection complicated with ileus, dehydration, hypokalemia and hyponatremia.    77 y.o. 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  Continue anticoagulation with apixaban.  Continue telemetry monitoring.   Hyponatremia Resolved Will monitor on torsemide   Pyuria  Has indwelling foley Likely related to this Currently getting abx with ceftriaxone, will give 1 more dose Unfortunately, no urine culture done   Chronic diastolic CHF (congestive heart failure) (HCC) Echocardiogram with mild reduction in LV systolic function EF 45 to 50%, global hypokinesis, RV systolic function preserved. No significant valvular disease.  Metoprolol. torsemide Note that patient also take metolazone as needed at home.     Cushing's syndrome, ACTH dependent (HCC) Patient gets corticotropin injections q 10 days.    HLD (hyperlipidemia) Continue with statin therapy    Hypothyroidism Continue with levothyroxine    Depression with anxiety Continue with alprazolam.    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  Patient is wheelchair bound and non ambulatory.   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      DVT prophylaxis: eliquis Code Status: full Family Communication: none Disposition:   Status is: Inpatient Remains inpatient appropriate because: need for continued monitoring   Consultants:  None  Procedures:  none  Antimicrobials:  Anti-infectives (From admission, onward)    Start     Dose/Rate Route Frequency Ordered Stop   08/14/23 1000  cefTRIAXone (ROCEPHIN) 1 g in sodium chloride 0.9 % 100 mL IVPB        1 g 200 mL/hr over 30 Minutes Intravenous Every 24 hours 08/13/23 1655 08/17/23 1157   08/13/23 1530  cefTRIAXone (ROCEPHIN) 1 g in sodium chloride 0.9 % 100 mL IVPB        1 g 200 mL/hr over 30 Minutes Intravenous  Once 08/13/23 1523 08/13/23 1656       Subjective  Was eager for possible d/c today  Objective: Vitals:   08/18/23 0357 08/18/23 0754 08/18/23 0938 08/18/23 0940  BP: (!) 93/52 102/66 101/65   Pulse: 81 77 (!) 101 88  Resp: 19 14 15 16   Temp: 98.7 F (37.1 C) 98.3 F (36.8 C)    TempSrc: Axillary Oral    SpO2: 98% 100% 98% 100%  Weight:      Height:        Intake/Output Summary (Last 24 hours) at 08/18/2023 1049 Last data filed at  08/18/2023 0940 Gross per 24 hour  Intake 720 ml  Output 3875 ml  Net -3155 ml   Filed Weights   08/13/23 1314 08/14/23 1344 08/15/23 0407  Weight: 88.9 kg 93.7 kg 94.2 kg    Examination:  General: No acute distress. Cardiovascular: irregularly irregular, intermittent rapid rate Lungs: unlabored Neurological: Alert and oriented 3.  Moves all extremities 4 with equal strength. Cranial nerves II through XII grossly intact. Extremities: trace LE edema     Data Reviewed: I have personally reviewed following labs and imaging studies  CBC: Recent Labs  Lab 08/14/23 0749 08/15/23 0309 08/16/23 0301 08/17/23 0333 08/18/23 0304  WBC 7.1 8.0 8.0 8.1 8.1  NEUTROABS  --   --   --  3.4 3.6  HGB 12.5 11.6* 11.9* 12.5 13.0  HCT 36.0 34.0* 35.7* 37.2 38.6  MCV 91.4 92.4 94.7 94.2 93.7  PLT 173 180 177 205 189    Basic Metabolic Panel: Recent Labs  Lab 08/14/23 1943 08/15/23 0309 08/16/23 0301 08/17/23 0333 08/18/23 0304  NA 133* 135 137 138 138  K 3.5 3.1* 4.9 3.2* 4.1  CL 94* 95* 99 95* 95*  CO2 34* 33* 28 34* 33*  GLUCOSE 83 90 111* 87 97  BUN 6* 5* 14 20 24*  CREATININE 0.50 0.55 0.54 0.56 0.69  CALCIUM 8.6* 8.6* 8.6* 8.6* 8.8*  MG  --   --  2.3 2.0 2.0  PHOS  --   --   --  4.3 4.3    GFR: Estimated Creatinine Clearance: 65.3 mL/min (by C-G formula based on SCr of 0.69 mg/dL).  Liver Function Tests: Recent Labs  Lab 08/13/23 1316 08/17/23 0333 08/18/23 0304  AST 14* 14* 13*  ALT 11 9 9   ALKPHOS 64 49 50  BILITOT 0.6 0.4 0.5  PROT 6.5 5.5* 5.8*  ALBUMIN 3.4* 2.9* 2.9*    CBG: No results for input(s): "GLUCAP" in the last 168 hours.   No results found for this or any previous visit (from the past 240 hours).       Radiology Studies: No results found.      Scheduled Meds:  ALPRAZolam  0.5 mg Oral QHS   apixaban  5 mg Oral BID   buPROPion  150 mg Oral Daily   gabapentin  300 mg Oral QID   hydrocerin  1 Application Topical Daily   lactulose  10 g Oral BID   levothyroxine  75 mcg Oral Q0600   melatonin  5 mg Oral QHS   metoprolol succinate  25 mg Oral Daily   polyethylene glycol  17 g Oral Daily   polyvinyl alcohol  1 drop Both Eyes BID   psyllium  1 packet Oral BID   sodium chloride flush  3 mL Intravenous Q12H   torsemide  30 mg Oral BID   Continuous  Infusions:     LOS: 4 days    Time spent: over 30 min    Lacretia Nicks, MD [facility]   To contact the attending provider between 7A-7P or the covering provider during after hours 7P-7A, please log into the web site [email or web address] and access using universal [place] password for that web site. If you do not have the password, please call the hospital operator.  08/18/2023, 10:49 AM

## 2023-08-18 NOTE — Plan of Care (Signed)
  Problem: Education: Goal: Knowledge of General Education information will improve Description Including pain rating scale, medication(s)/side effects and non-pharmacologic comfort measures Outcome: Progressing   Problem: Health Behavior/Discharge Planning: Goal: Ability to manage health-related needs will improve Outcome: Progressing   

## 2023-08-18 NOTE — TOC Progression Note (Signed)
Transition of Care [facility]) - Progression Note    Patient Details  Name: Toni [name] MRN: [id number] Date of Birth: [birth year]  Transition of [facility]) CM/SW Contact  Patrice Paradise, LCSW Phone Number: 08/18/2023, 10:56 AM  Clinical Narrative:    CSW was notified by MD that pt would not be discharging today however possibly tomorrow. CSW called Belenda Cruise at the facility and she confirmed that a discharge tomorrow should work as well.   TOC team will continue to assist with discharge planning needs.    Expected Discharge Plan: Skilled Nursing Facility Barriers to Discharge: Continued Medical Work up  Expected Discharge Plan and Services In-house Referral: Clinical Social Work     Living arrangements for the past 2 months: Skilled Nursing Facility                                       Social Determinants of Health (SDOH) Interventions SDOH Screenings   Food Insecurity: No Food Insecurity (08/13/2023)  Housing: Low Risk  (08/13/2023)  Transportation Needs: No Transportation Needs (08/13/2023)  Utilities: Not At Risk (08/13/2023)  Depression (PHQ2-9): Low Risk  (08/06/2018)  Tobacco Use: Medium Risk (08/13/2023)    Readmission Risk Interventions     No data to display

## 2023-08-18 NOTE — Plan of Care (Signed)
  Problem: Clinical Measurements: Goal: Diagnostic test results will improve Outcome: Progressing   Problem: Clinical Measurements: Goal: Will remain free from infection Outcome: Progressing   Problem: Clinical Measurements: Goal: Ability to maintain clinical measurements within normal limits will improve Outcome: Progressing   Problem: Clinical Measurements: Goal: Respiratory complications will improve Outcome: Progressing   Problem: Coping: Goal: Level of anxiety will decrease Outcome: Progressing   Problem: Elimination: Goal: Will not experience complications related to bowel motility Outcome: Progressing   Problem: Skin Integrity: Goal: Risk for impaired skin integrity will decrease Outcome: Progressing

## 2023-08-18 NOTE — Progress Notes (Signed)
Patient's HR is shooting up for few seconds and then comes back to normal quickly which is happening with the patient's movement or if she is eating and talking.

## 2023-08-18 NOTE — Plan of Care (Signed)
  Problem: Education: Goal: Knowledge of General Education information will improve Description: Including pain rating scale, medication(s)/side effects and non-pharmacologic comfort measures 08/18/2023 0031 by Charmian Muff, RN Outcome: Progressing 08/18/2023 0029 by Charmian Muff, RN Outcome: Progressing   Problem: Health Behavior/Discharge Planning: Goal: Ability to manage health-related needs will improve 08/18/2023 0031 by Charmian Muff, RN Outcome: Progressing 08/18/2023 0029 by Charmian Muff, RN Outcome: Progressing   Problem: Clinical Measurements: Goal: Ability to maintain clinical measurements within normal limits will improve 08/18/2023 0031 by Charmian Muff, RN Outcome: Progressing 08/18/2023 0029 by Charmian Muff, RN Outcome: Progressing Goal: Will remain free from infection Outcome: Progressing

## 2023-08-19 DIAGNOSIS — E871 Hypo-osmolality and hyponatremia: Secondary | ICD-10-CM | POA: Diagnosis not present

## 2023-08-19 MED ORDER — MIDODRINE HCL 5 MG PO TABS
5.0000 mg | ORAL_TABLET | Freq: Three times a day (TID) | ORAL | Status: DC
  Administered 2023-08-19 – 2023-08-20 (×4): 5 mg via ORAL
  Filled 2023-08-19 (×4): qty 1

## 2023-08-19 MED ORDER — AMIODARONE HCL 200 MG PO TABS: 200.0000 mg | ORAL_TABLET | Freq: Every day | ORAL | Status: DC

## 2023-08-19 MED ORDER — AMIODARONE HCL 200 MG PO TABS: 200.0000 mg | ORAL_TABLET | Freq: Two times a day (BID) | ORAL | Status: DC

## 2023-08-19 MED ORDER — METOPROLOL TARTRATE 25 MG PO TABS: 25.0000 mg | ORAL_TABLET | Freq: Two times a day (BID) | ORAL | Status: DC

## 2023-08-19 MED ORDER — AMIODARONE HCL 200 MG PO TABS
200.0000 mg | ORAL_TABLET | Freq: Two times a day (BID) | ORAL | Status: DC
  Administered 2023-08-19 – 2023-08-20 (×4): 200 mg via ORAL
  Filled 2023-08-19 (×4): qty 1

## 2023-08-19 NOTE — Progress Notes (Signed)
PROGRESS NOTE    Toni [name]  ZOX:[id number] DOB: Jun 07, 1946 DOA: 08/13/2023 PCP: Sherron Monday, MD  Chief Complaint  Patient presents with   Abdominal Pain    Brief Narrative:   Toni [name] was admitted to the hospital with the working diagnosis of urinary tract infection complicated with ileus, dehydration, hypokalemia and hyponatremia.    77 y.o. 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   Assessment & Plan:   Principal Problem:   Hyponatremia Active Problems:   UTI (urinary tract infection)   Permanent atrial fibrillation (HCC)   Chronic diastolic CHF (congestive heart failure) (HCC)   Cushing's syndrome, ACTH dependent (HCC)   HLD (hyperlipidemia)   Hypothyroidism   Depression with anxiety   Multiple sclerosis, 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  Permanent atrial fibrillation  Rapid Ventricular Rate Rate improved, but BP  Will start amiodarone, hold metoprolol for SBP <100 Continue anticoagulation with apixaban.  Continue telemetry monitoring.   Hypotension Schedule midodrine and follow   Hyponatremia Resolved Will monitor on torsemide   Pyuria  Has indwelling foley Likely related to this Currently getting abx with ceftriaxone, will give 1 more dose Unfortunately, no urine culture done   Chronic diastolic CHF (congestive heart failure) (HCC) Echocardiogram with mild reduction in LV systolic function EF 45 to 50%, global hypokinesis, RV systolic function preserved. No significant valvular disease.  Holding metoprolol and torsemide for now with low blood pressures Note that patient also take  metolazone as needed at home.    Cushing's syndrome, ACTH dependent (HCC) Patient gets corticotropin injections q 10 days. She tells me she hasn't had this in Toni [name] year Had random cortisol of 16   HLD (hyperlipidemia) Continue with statin therapy    Hypothyroidism Continue with levothyroxine    Depression with anxiety Continue with alprazolam.    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  Patient is wheelchair bound and non ambulatory.   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      DVT prophylaxis: eliquis Code Status: full Family Communication: none Disposition:   Status is: Inpatient Remains inpatient appropriate because: need for continued monitoring   Consultants:  None  Procedures:  none  Antimicrobials:  Anti-infectives (From admission, onward)    Start     Dose/Rate Route Frequency Ordered Stop   08/14/23 1000  cefTRIAXone (ROCEPHIN) 1 g in sodium chloride 0.9 % 100 mL IVPB        1 g 200 mL/hr over 30 Minutes Intravenous Every 24 hours 08/13/23 1655 08/18/23 1900   08/13/23 1530  cefTRIAXone (ROCEPHIN) 1 g in sodium chloride 0.9 % 100 mL IVPB        1 g 200 mL/hr over 30 Minutes Intravenous  Once 08/13/23 1523 08/13/23 1656       Subjective  No new complaints  Objective: Vitals:   08/19/23 1430 08/19/23 1435 08/19/23 1500 08/19/23 1501  BP: (!) 90/36 (!) 90/50 (!) 94/44 (!) 94/44  Pulse: 71 90 77 86  Resp: 19 19 12 15   Temp:    98.2 F (36.8 C)  TempSrc:    Oral  SpO2: 100% 100% 96% 100%  Weight:      Height:  Intake/Output Summary (Last 24 hours) at 08/19/2023 1557 Last data filed at 08/19/2023 1500 Gross per 24 hour  Intake 1383 ml  Output 3700 ml  Net -2317 ml   Filed Weights   08/13/23 1314 08/14/23 1344 08/15/23 0407  Weight: 88.9 kg 93.7 kg 94.2 kg    Examination:  General: No acute distress. Cardiovascular: RRR Lungs:  unlabored Neurological: Alert and oriented 3. Moves all extremities 4. Cranial nerves II through XII grossly intact. Extremities: trace LE edema   Data Reviewed: I have personally reviewed following labs and imaging studies  CBC: Recent Labs  Lab 08/14/23 0749 08/15/23 0309 08/16/23 0301 08/17/23 0333 08/18/23 0304  WBC 7.1 8.0 8.0 8.1 8.1  NEUTROABS  --   --   --  3.4 3.6  HGB 12.5 11.6* 11.9* 12.5 13.0  HCT 36.0 34.0* 35.7* 37.2 38.6  MCV 91.4 92.4 94.7 94.2 93.7  PLT 173 180 177 205 189    Basic Metabolic Panel: Recent Labs  Lab 08/14/23 1943 08/15/23 0309 08/16/23 0301 08/17/23 0333 08/18/23 0304  NA 133* 135 137 138 138  K 3.5 3.1* 4.9 3.2* 4.1  CL 94* 95* 99 95* 95*  CO2 34* 33* 28 34* 33*  GLUCOSE 83 90 111* 87 97  BUN 6* 5* 14 20 24*  CREATININE 0.50 0.55 0.54 0.56 0.69  CALCIUM 8.6* 8.6* 8.6* 8.6* 8.8*  MG  --   --  2.3 2.0 2.0  PHOS  --   --   --  4.3 4.3    GFR: Estimated Creatinine Clearance: 65.3 mL/min (by C-G formula based on SCr of 0.69 mg/dL).  Liver Function Tests: Recent Labs  Lab 08/13/23 1316 08/17/23 0333 08/18/23 0304  AST 14* 14* 13*  ALT 11 9 9   ALKPHOS 64 49 50  BILITOT 0.6 0.4 0.5  PROT 6.5 5.5* 5.8*  ALBUMIN 3.4* 2.9* 2.9*    CBG: No results for input(s): "GLUCAP" in the last 168 hours.   No results found for this or any previous visit (from the past 240 hours).       Radiology Studies: No results found.      Scheduled Meds:  ALPRAZolam  0.5 mg Oral QHS   amiodarone  200 mg Oral BID   Followed by   Melene Muller ON 09/03/2023] amiodarone  200 mg Oral Daily   apixaban  5 mg Oral BID   buPROPion  150 mg Oral Daily   gabapentin  300 mg Oral QID   hydrocerin  1 Application Topical Daily   lactulose  10 g Oral BID   levothyroxine  75 mcg Oral Q0600   melatonin  5 mg Oral QHS   metoprolol tartrate  25 mg Oral BID   midodrine  5 mg Oral TID WC   polyethylene glycol  17 g Oral Daily   polyvinyl alcohol  1 drop  Both Eyes BID   psyllium  1 packet Oral BID   sodium chloride flush  3 mL Intravenous Q12H   torsemide  30 mg Oral BID   Continuous Infusions:     LOS: 5 days    Time spent: over 30 min    Toni Nicks, MD [facility]   To contact the attending provider between 7A-7P or the covering provider during after hours 7P-7A, please log into the web site [email or web address] and access using universal [place] password for that web site. If you do not have the password, please call the hospital operator.  08/19/2023, 3:57 PM

## 2023-08-19 NOTE — Progress Notes (Signed)
Patient's blood pressure is on lower range and MAP is less than 65, informed to the MD and midodrine given as per the order. Her morning dose of BB and Diuretic meds not give, MD aware. Patient is not symptomatic for hypotension.  Monitoring continuously   08/19/23 1142  Vitals  Temp 98.2 F (36.8 C)  Temp Source Oral  BP (!) 98/50  MAP (mmHg) (!) 63  BP Location Right Arm  BP Method Automatic  Patient Position (if appropriate) Lying  Pulse Rate 63  Pulse Rate Source Monitor  ECG Heart Rate 78  Resp 20  Level of Consciousness  Level of Consciousness Alert  MEWS COLOR  MEWS Score Color Green  Oxygen Therapy  SpO2 99 %  O2 Device Room Air  MEWS Score  MEWS Temp 0  MEWS Systolic 1  MEWS Pulse 0  MEWS RR 0  MEWS LOC 0  MEWS Score 1   .

## 2023-08-19 NOTE — Progress Notes (Signed)
Patient's HR improved overnight after receiving po Metoprolol. HR 70s-90s. BP soft overnight, 87/55 MAP 65, Midodrine given per order with minor improvement.

## 2023-08-19 NOTE — Progress Notes (Addendum)
Patient's blood pressure is consistently on lower range, BP obtained  manually as well, 90/50 at 1435pm,  Patient denies any symptoms, informed to the MD via secure chat,  Paged the MD and notified, MD suggested to keep monitoring her BP.   08/19/23 1435  Vitals  BP (!) 90/50  BP Location Left Arm  BP Method Manual  Patient Position (if appropriate) Lying  Pulse Rate 90  Pulse Rate Source Monitor  ECG Heart Rate 86  Resp 19  Level of Consciousness  Level of Consciousness Alert  MEWS COLOR  MEWS Score Color Green  Oxygen Therapy  SpO2 100 %  O2 Device Room Air  Glasgow Coma Scale  Eye Opening 4  Best Verbal Response (NON-intubated) 5  Best Motor Response 6  Glasgow Coma Scale Score 15  MEWS Score  MEWS Temp 0  MEWS Systolic 1  MEWS Pulse 0  MEWS RR 0  MEWS LOC 0  MEWS Score 1  Provider Notification  Provider Name/Title Dr Lowell Guitar  Date Provider Notified 08/19/23  Time Provider Notified 1400  Method of Notification  (secure chat)  Notification Reason Change in status  Provider response Evaluate remotely

## 2023-08-19 NOTE — Plan of Care (Signed)
°  Problem: Clinical Measurements: Goal: Will remain free from infection Outcome: Progressing   Problem: Clinical Measurements: Goal: Ability to maintain clinical measurements within normal limits will improve Outcome: Progressing   Problem: Clinical Measurements: Goal: Diagnostic test results will improve Outcome: Progressing   Problem: Clinical Measurements: Goal: Cardiovascular complication will be avoided Outcome: Progressing   Problem: Activity: Goal: Risk for activity intolerance will decrease Outcome: Progressing   Problem: Elimination: Goal: Will not experience complications related to bowel motility Outcome: Progressing   Problem: Safety: Goal: Ability to remain free from injury will improve Outcome: Progressing   Problem: Skin Integrity: Goal: Risk for impaired skin integrity will decrease Outcome: Progressing

## 2023-08-20 DIAGNOSIS — E871 Hypo-osmolality and hyponatremia: Secondary | ICD-10-CM | POA: Diagnosis not present

## 2023-08-20 LAB — COMPREHENSIVE METABOLIC PANEL
ALT: 9 U/L (ref 0–44)
AST: 13 U/L — ABNORMAL LOW (ref 15–41)
Albumin: 2.9 g/dL — ABNORMAL LOW (ref 3.5–5.0)
Alkaline Phosphatase: 54 U/L (ref 38–126)
Anion gap: 8 (ref 5–15)
BUN: 25 mg/dL — ABNORMAL HIGH (ref 8–23)
CO2: 36 mmol/L — ABNORMAL HIGH (ref 22–32)
Calcium: 9 mg/dL (ref 8.9–10.3)
Chloride: 94 mmol/L — ABNORMAL LOW (ref 98–111)
Creatinine, Ser: 0.7 mg/dL (ref 0.44–1.00)
GFR, Estimated: >60 mL/min (ref >60)
Glucose, Bld: 93 mg/dL (ref 70–99)
Potassium: 3.7 mmol/L (ref 3.5–5.1)
Sodium: 138 mmol/L (ref 135–145)
Total Bilirubin: 0.6 mg/dL (ref <1.2)
Total Protein: 5.7 g/dL — ABNORMAL LOW (ref 6.5–8.1)

## 2023-08-20 LAB — CBC WITH DIFFERENTIAL/PLATELET
Abs Immature Granulocytes: 0.02 10*3/uL (ref 0.00–0.07)
Basophils Absolute: 0.1 10*3/uL (ref 0.0–0.1)
Basophils Relative: 1 %
Eosinophils Absolute: 0.6 10*3/uL — ABNORMAL HIGH (ref 0.0–0.5)
Eosinophils Relative: 8 %
HCT: 36.9 % (ref 36.0–46.0)
Hemoglobin: 12.5 g/dL (ref 12.0–15.0)
Immature Granulocytes: 0 %
Lymphocytes Relative: 42 %
Lymphs Abs: 3.1 10*3/uL (ref 0.7–4.0)
MCH: 31.6 pg (ref 26.0–34.0)
MCHC: 33.9 g/dL (ref 30.0–36.0)
MCV: 93.2 fL (ref 80.0–100.0)
Monocytes Absolute: 0.8 10*3/uL (ref 0.1–1.0)
Monocytes Relative: 11 %
Neutro Abs: 2.9 10*3/uL (ref 1.7–7.7)
Neutrophils Relative %: 38 %
Platelets: 200 10*3/uL (ref 150–400)
RBC: 3.96 MIL/uL (ref 3.87–5.11)
RDW: 12.5 % (ref 11.5–15.5)
WBC: 7.4 10*3/uL (ref 4.0–10.5)
nRBC: 0 % (ref 0.0–0.2)

## 2023-08-20 LAB — MAGNESIUM: Magnesium: 2.6 mg/dL — ABNORMAL HIGH (ref 1.7–2.4)

## 2023-08-20 LAB — PHOSPHORUS: Phosphorus: 4 mg/dL (ref 2.5–4.6)

## 2023-08-20 MED ORDER — TORSEMIDE 20 MG PO TABS: 30.0000 mg | ORAL_TABLET | Freq: Every day | ORAL | Status: DC

## 2023-08-20 MED ORDER — FLUTICASONE PROPIONATE 50 MCG/ACT NA SUSP
2.0000 | Freq: Every day | NASAL | Status: DC
  Filled 2023-08-20: qty 16

## 2023-08-20 MED ORDER — AMIODARONE HCL 200 MG PO TABS: ORAL_TABLET | ORAL | Status: DC

## 2023-08-20 MED ORDER — BUPROPION HCL ER (XL) 150 MG PO TB24: 150.0000 mg | ORAL_TABLET | Freq: Every day | ORAL | Status: AC

## 2023-08-20 MED ORDER — MIDODRINE HCL 5 MG PO TABS: 5.0000 mg | ORAL_TABLET | Freq: Three times a day (TID) | ORAL | Status: AC

## 2023-08-20 MED ORDER — LORATADINE 10 MG PO TABS
10.0000 mg | ORAL_TABLET | Freq: Every day | ORAL | Status: DC
Start: 2023-08-20 — End: 2023-08-20
  Administered 2023-08-20: 10 mg via ORAL
  Filled 2023-08-20: qty 1

## 2023-08-20 NOTE — Progress Notes (Signed)
Pt currently on room air tolerating well with SVS and in no distress. BiPAP (pts home unit) at bedside.

## 2023-08-20 NOTE — Plan of Care (Signed)

## 2023-08-20 NOTE — Discharge Summary (Signed)
Physician Discharge Summary  Toni [name] UJW:[id number] DOB: [birth year] DOA: 08/13/2023  PCP: Sherron Monday, MD  Admit date: 08/13/2023 Discharge date: 08/20/2023  Time spent: 40 minutes  Recommendations for Outpatient Follow-up:  Follow outpatient CBC/CMP  Follow volume outpatient - torsemide reduced given hypovolemic hyponatremia at presentation and alkalosis on day of discharge - adjust as needed Follow blood pressure outpatient - discharging on scheduled midodrine - metoprolol stopped Started on amiodarone given soft blood pressure on metoprolol - follow how she's doing on this Follow with neurology outpatient Follow renal cell carcinoma outpatient (chart history notes CT scan every 6 months to watch, but last CT scan was 06/2022 prior to this admission - follow with outpatient provider)  Discharge Diagnoses:  Principal Problem:   Hyponatremia Active Problems:   UTI (urinary tract infection)   Permanent atrial fibrillation (HCC)   Chronic diastolic CHF (congestive heart failure) (HCC)   Cushing's syndrome, ACTH dependent (HCC)   HLD (hyperlipidemia)   Hypothyroidism   Depression with anxiety   Multiple sclerosis, 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    77 y.o. 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   She was treated with antibiotics for possible UTI.  Treated for an ileus and constipation.  She's improved with supportive care.  Her hospitalization complicated by soft blood pressures (relatively chronic issue) and atrial fibrillation with RVR.  She was transitioned to amiodarone, metoprolol stopped, midodrine scheduled.  She's improved on the day of discharge.  See below for additional details   Hospital Course:  Assessment and Plan:  Permanent atrial fibrillation  Rapid Ventricular Rate Transitioned from metoprolol to amiodarone given soft blood pressures Rates improved today, had Madsen Riddle few pauses this morning, longest 2.5 seconds - early morning, suspect she was sleeping - rates while I was at bed side generally in the 80's-90's (up to low 100's when she was talking) Continue amiodarone, follow outpatient with cardiology Continue anticoagulation with apixaban.  Continue telemetry monitoring.    Chronic Hypotension Schedule midodrine and follow  Stop metoprolol    Hyponatremia Resolved, suspect due to hypovolemic hyponatremia in setting of vomiting and thiazide diuretic use? Will monitor on torsemide (follow - stop metolazone) - follow volume outpatient    Pyuria  Has indwelling foley Likely related to this S/p 3 doses of antibiotics Unfortunately, no urine culture done   Constipation  Ileus Improved   Chronic diastolic CHF (congestive heart failure) (HCC) Echocardiogram with mild reduction in LV systolic function EF 45 to 50%, global hypokinesis, RV systolic function preserved. No significant valvular disease.  Holding metoprolol with low blood pressures Will discharge on torsemide once Dylen Mcelhannon day with plan for close follow up to adjust as needed   Cushing's syndrome, ACTH dependent (HCC) Patient gets corticotropin injections q 10 days. She tells me she hasn't had this in Meeah Totino year Had random cortisol of 16 Follow with outpatient  provider   HLD (hyperlipidemia)  Continue with statin therapy    Hypothyroidism Continue with levothyroxine    Depression with anxiety Continue with alprazolam.    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    Renal Cell Carcinoma Per chart, get CT scan q6 months    Procedures: none   Consultations: none  Discharge Exam: Vitals:   08/20/23 0808 08/20/23 1131  BP: 110/63 (!) 93/43  Pulse: 74 85  Resp: 20 20  Temp: 98.1 F (36.7 C) 98.1 F (36.7 C)  SpO2:  97%   Feels ok, no complaints today Eager to go home  General: No acute distress. Cardiovascular: irregularly irregular, generally rate controlled in room, occasional increase to 100s briefly  Lungs: unlabored Abdomen: Soft, nontender, nondistended  Neurological: Alert and oriented 3. Moves all extremities 4 with equal strength. Cranial nerves II through XII grossly intact. Extremities: trace edema  Discharge Instructions   Discharge Instructions     (HEART FAILURE PATIENTS) Call MD:  Anytime you have any of the following symptoms: 1) 3 pound weight gain in 24 hours or 5 pounds in 1 week 2) shortness of breath, with or without Jaylene Arrowood dry hacking cough 3) swelling in the hands, feet or stomach 4) if you have to sleep on extra pillows at night in order to breathe.   Complete by: As directed    Call MD for:  difficulty breathing, headache or visual disturbances   Complete by: As directed    Call MD for:  extreme fatigue   Complete by: As directed    Call MD for:  hives   Complete by: As directed    Call MD for:  persistant dizziness or light-headedness   Complete by: As directed    Call MD for:  persistant nausea and vomiting   Complete by: As directed    Call MD for:  redness, tenderness, or signs of infection (pain, swelling, redness, odor or green/yellow discharge around incision site)   Complete by:  As directed    Call MD for:  severe uncontrolled pain   Complete by: As directed    Call MD for:  temperature >100.4   Complete by: As directed    Diet - low sodium heart healthy   Complete by: As directed    Discharge instructions   Complete by: As directed    You were seen for generalized weakness and abdominal pain with constipation and vomiting.   We treated you for Maddax Palinkas possible UTI and we treated your constipation.  You had evidence of dehydration on presentation and you had Jaeceon Michelin low sodium.  I recommend stopping the metolazone for now.  We'll also decrease your torsemide to once Jalayne Ganesh day.  We had some issues with hypotension (low blood pressure) and your heart rate being too fast.  We stopped your metoprolol and started you on Shatiqua Heroux new medicine called amiodarone.  You should follow up with your cardiologist outpatient.  I've scheduled your midodrine instead of having this as an as needed medicine.  Your blood pressure is overall improved.  For your heart failure/swelling, take the torsemide once Jahmere Bramel day.  Weigh yourself daily.  If you gain more than 3 lbs in Nadiya Pieratt day or 5 lbs in Anisa Leanos week, take an extra dose of torsemide.  Follow up with your PCP/cardiologist regarding your diuretic dosing and your volume status (weights) closely so they can make adjustments as needed.  Follow  your renal cell carcinoma outpatient.   Return for new, recurrent, or worsening symptoms.  Please ask your PCP to request records from this hospitalization so they know what was done and what the next steps will be.   Increase activity slowly   Complete by: As directed    No wound care   Complete by: As directed       Allergies as of 08/20/2023       Reactions   Other Hives, Rash   Topical Surgical prep  - severe rash, hives   Hydrocodone Nausea And Vomiting   Oxycontin [oxycodone] Nausea And Vomiting   Zoloft [sertraline] Hives, Swelling, Rash   Chloraseptic [phenol] Hives   Macrobid [nitrofurantoin] Nausea And  Vomiting   Not documented on MAR   Vioxx [rofecoxib] Other (See Comments)   Unknown reaction Can take NSAIDS   Hibiclens [chlorhexidine Gluconate] Hives, Rash, Other (See Comments)   Sores        Medication List     STOP taking these medications    metolazone 2.5 MG tablet Commonly known as: ZAROXOLYN   metoprolol succinate 25 MG 24 hr tablet Commonly known as: TOPROL-XL       TAKE these medications    acetaminophen 500 MG tablet Commonly known as: TYLENOL Take 1,000 mg by mouth every 8 (eight) hours as needed for fever or headache (pain).   Acthar 80 UNIT/ML injectable gel Generic drug: corticotropin Inject 1 mL (80 Units total) into the skin See admin instructions for 10 days. Inject 80 units as needed (about 5 times Erasmus Bistline month)   ALPRAZolam 0.5 MG tablet Commonly known as: XANAX Take 1 tablet (0.5 mg total) by mouth at bedtime. May also take 1 tablet every 12 hours as needed anxiety What changed:  when to take this additional instructions   amiodarone 200 MG tablet Commonly known as: Pacerone Take 1 tablet (200 mg total) by mouth 2 (two) times daily for 14 days, THEN 1 tablet (200 mg total) daily. Follow up with cardiology outpatient for refills and continued recommendations. Start taking on: August 20, 2023   baclofen 20 MG tablet Commonly known as: LIORESAL Take 20 mg by mouth daily as needed for muscle spasms.   Biofreeze Cool The Pain 4 % Gel Generic drug: Menthol (Topical Analgesic) Apply 1 application  topically 3 (three) times daily. To left calf   Biotin 10 MG Caps Take 10,000 mcg by mouth daily after lunch.   bisacodyl 10 MG suppository Commonly known as: DULCOLAX Place 10 mg rectally See admin instructions. Insert 10 mg (1 suppository) rectally once daily on Sunday, Tuesday, Thursday. May use an additional suppository once daily as needed for constipation.   buPROPion 150 MG 24 hr tablet Commonly known as: WELLBUTRIN XL Take 1 tablet (150 mg  total) by mouth daily. What changed: Another medication with the same name was removed. Continue taking this medication, and follow the directions you see here.   CeraVe Daily Moisturizing Lotn Apply 1 application  topically daily. To groin, inguinal area   Aquaphor Adv Protect Healing 41 % Oint Apply 1 application  topically See admin instructions. Apply topically every shift. Apply to redness in crease of buttocks every shift with and with each toileting times.   cetirizine 10 MG tablet Commonly known as: ZYRTEC Take 10 mg by mouth daily as needed for allergies.   cholecalciferol 25 MCG (1000 UNIT) tablet Commonly known as: VITAMIN D3 Take 2,000 Units by mouth 2 (two) times daily.  clotrimazole 1 % cream Commonly known as: LOTRIMIN Apply 1 Application topically 2 (two) times daily. To perineal area and abdominal folds   diphenhydrAMINE 25 mg capsule Commonly known as: BENADRYL Take 25 mg by mouth 3 (three) times daily as needed (minor itching, irritation).   docusate sodium 100 MG capsule Commonly known as: COLACE Take 100 mg by mouth 2 (two) times daily.   Eliquis 5 MG Tabs tablet Generic drug: apixaban Take 5 mg by mouth 2 (two) times daily.   famotidine 20 MG tablet Commonly known as: PEPCID Take 20 mg by mouth 2 (two) times daily.   fluticasone 50 MCG/ACT nasal spray Commonly known as: FLONASE Place 1 spray into both nostrils daily as needed (for nasal congestion).   gabapentin 300 MG capsule Commonly known as: NEURONTIN Toni [name] will take 300 mg of gabapentin at 6 AM, 10 AM, 2 PM and 6 PM and 600 mg at 10 PM by mouth. What changed:  how much to take how to take this when to take this additional instructions   lactulose 10 GM/15ML solution Commonly known as: CHRONULAC Take 15 mLs (10 g total) by mouth 2 (two) times daily as needed for mild constipation. What changed: when to take this   levothyroxine 75 MCG tablet Commonly known as: SYNTHROID Take 75  mcg by mouth daily before breakfast.   METAMUCIL SMOOTH TEXTURE PO Take 1 packet by mouth 2 (two) times daily.   midodrine 5 MG tablet Commonly known as: PROAMATINE Take 1 tablet (5 mg total) by mouth 3 (three) times daily with meals. What changed:  medication strength when to take this reasons to take this Another medication with the same name was removed. Continue taking this medication, and follow the directions you see here.   Motegrity 2 MG Tabs Generic drug: Prucalopride Succinate Take 2 mg by mouth daily before breakfast.   ondansetron 4 MG disintegrating tablet Commonly known as: ZOFRAN-ODT Take 4 mg by mouth 4 (four) times daily as needed for nausea.   oxybutynin 5 MG tablet Commonly known as: DITROPAN Take 5 mg by mouth 2 (two) times daily as needed (overactive bladder).   phenazopyridine 100 MG tablet Commonly known as: PYRIDIUM Take 100 mg by mouth every 8 (eight) hours as needed (dysuria).   polyethylene glycol 17 g packet Commonly known as: MIRALAX / GLYCOLAX Take 17 g by mouth daily.   potassium chloride SA 20 MEQ tablet Commonly known as: KLOR-CON M Take 2 tablets (40 mEq total) by mouth daily. What changed: when to take this   Preparation H 0.25-14-74.9 % rectal ointment Generic drug: phenylephrine-shark liver oil-mineral oil-petrolatum Place 1 Application rectally 3 (three) times daily as needed for hemorrhoids.   Salicylic Acid-Cleanser 6 % CREAM Kit Apply 1 each topically See admin instructions. Apply topically once Elias Dennington day on Monday, Wednesday, Saturday to lesion on foot 3x weekly. Once soft, gently file with disposible nail file weekly, repeat until resolved.   senna 8.6 MG Tabs tablet Commonly known as: SENOKOT Take 17.2 mg by mouth 2 (two) times daily.   sodium phosphate 7-19 GM/118ML Enem Place 1 enema rectally daily as needed (constipation).   Systane 0.4-0.3 % Soln Generic drug: Polyethyl Glycol-Propyl Glycol Place 1 drop into both eyes  in the morning and at bedtime.   torsemide 20 MG tablet Commonly known as: DEMADEX Take 1.5 tablets (30 mg total) by mouth daily. Weigh yourself daily.  If you gain 2-3 lbs in Shakim Faith day or 5 lbs in Jestin Burbach  week or develop worsening swelling, take an extra dose of lasix.  You should follow up your dosing with your PCP/ cardiologist outpatient to determine if the dose needs further adjustment. What changed:  when to take this additional instructions       Allergies  Allergen Reactions   Other Hives and Rash    Topical Surgical prep  - severe rash, hives   Hydrocodone Nausea And Vomiting   Oxycontin [Oxycodone] Nausea And Vomiting   Zoloft [Sertraline] Hives, Swelling and Rash   Chloraseptic [Phenol] Hives   Macrobid [Nitrofurantoin] Nausea And Vomiting    Not documented on MAR   Vioxx [Rofecoxib] Other (See Comments)    Unknown reaction Can take NSAIDS   Hibiclens [Chlorhexidine Gluconate] Hives, Rash and Other (See Comments)    Sores      The results of significant diagnostics from this hospitalization (including imaging, microbiology, ancillary and laboratory) are listed below for reference.    Significant Diagnostic Studies: CT ABDOMEN PELVIS W CONTRAST Result Date: 08/13/2023 CLINICAL DATA:  Left lower quadrant abdominal pain * Tracking Code: BO * EXAM: CT ABDOMEN AND PELVIS WITH CONTRAST TECHNIQUE: Multidetector CT imaging of the abdomen and pelvis was performed using the standard protocol following bolus administration of intravenous contrast. RADIATION DOSE REDUCTION: This exam was performed according to the departmental dose-optimization program which includes automated exposure control, adjustment of the mA and/or kV according to patient size and/or use of iterative reconstruction technique. CONTRAST:  75mL OMNIPAQUE IOHEXOL 350 MG/ML SOLN COMPARISON:  07/13/2022 FINDINGS: Lower chest: No acute abnormality.  Cardiomegaly. Hepatobiliary: No solid liver abnormality is seen. Mildly  distended gallbladder. No visible gallstones, gallbladder wall thickening, or biliary dilatation. Pancreas: Unremarkable. No pancreatic ductal dilatation or surrounding inflammatory changes. Spleen: Normal in size without significant abnormality. Adrenals/Urinary Tract: Adrenal glands are unremarkable. Unchanged small enhancing mass arising from the superior pole of the left kidney measuring 1.4 x 1.1 cm (series 6, image 55). Foley catheter in the bladder. Catheter tip appears to be within the bladder wall with adjacent bladder wall thickening and fat stranding (series 6, image 56). Stomach/Bowel: Stomach is within normal limits. Appendix appears normal. No evidence of bowel wall thickening, distention, or inflammatory changes. Large burden of stool throughout the colon and rectum. Vascular/Lymphatic: No significant vascular findings are present. No enlarged abdominal or pelvic lymph nodes. Reproductive: No mass or other significant abnormality. IUD present in the fundal endometrial cavity. Other: No abdominal wall hernia or abnormality. No ascites. Musculoskeletal: No acute or significant osseous findings. IMPRESSION: 1. Foley catheter in the bladder. Catheter tip appears to be within the bladder wall with adjacent bladder wall thickening and fat stranding. 2. Unchanged small enhancing mass arising from the superior pole of the left kidney measuring 1.4 x 1.1 cm, consistent with small renal cell carcinoma. 3. Large burden of stool throughout the colon and rectum. Electronically Signed   By: Jearld Lesch M.D.   On: 08/13/2023 16:02    Microbiology: No results found for this or any previous visit (from the past 240 hours).   Labs: Basic Metabolic Panel: Recent Labs  Lab 08/15/23 0309 08/16/23 0301 08/17/23 0333 08/18/23 0304 08/20/23 0313  NA 135 137 138 138 138  K 3.1* 4.9 3.2* 4.1 3.7  CL 95* 99 95* 95* 94*  CO2 33* 28 34* 33* 36*  GLUCOSE 90 111* 87 97 93  BUN 5* 14 20 24* 25*  CREATININE  0.55 0.54 0.56 0.69 0.70  CALCIUM 8.6* 8.6* 8.6* 8.8* 9.0  MG  --  2.3 2.0 2.0 2.6*  PHOS  --   --  4.3 4.3 4.0   Liver Function Tests: Recent Labs  Lab 08/13/23 1316 08/17/23 0333 08/18/23 0304 08/20/23 0313  AST 14* 14* 13* 13*  ALT 11 9 9 9   ALKPHOS 64 49 50 54  BILITOT 0.6 0.4 0.5 0.6  PROT 6.5 5.5* 5.8* 5.7*  ALBUMIN 3.4* 2.9* 2.9* 2.9*   Recent Labs  Lab 08/13/23 1316  LIPASE 22   No results for input(s): "AMMONIA" in the last 168 hours. CBC: Recent Labs  Lab 08/15/23 0309 08/16/23 0301 08/17/23 0333 08/18/23 0304 08/20/23 0313  WBC 8.0 8.0 8.1 8.1 7.4  NEUTROABS  --   --  3.4 3.6 2.9  HGB 11.6* 11.9* 12.5 13.0 12.5  HCT 34.0* 35.7* 37.2 38.6 36.9  MCV 92.4 94.7 94.2 93.7 93.2  PLT 180 177 205 189 200   Cardiac Enzymes: No results for input(s): "CKTOTAL", "CKMB", "CKMBINDEX", "TROPONINI" in the last 168 hours. BNP: BNP (last 3 results) Recent Labs    11/22/22 0104  BNP 308.6*    ProBNP (last 3 results) No results for input(s): "PROBNP" in the last 8760 hours.  CBG: No results for input(s): "GLUCAP" in the last 168 hours.     Signed:  Lacretia Nicks MD.  [facility] 08/20/2023, 1:14 PM

## 2023-08-20 NOTE — Progress Notes (Signed)
Discharge orders received.  IV and telemetry removed. [facility] here for transport.  AVS provided.  Pt also provided with one 200mg  amiodarone tablet to be taken tonight at 2200.  Noted on AVS and notified transport as well as Toni Amend, Charity fundraiser at [facility] who was provided full transfer report.

## 2023-08-20 NOTE — TOC Transition Note (Signed)
Transition of Care [facility]) - Discharge Note   Patient Details  Name: Toni [name] MRN: [id number] Date of Birth: [birth year]  Transition of [facility]) CM/SW Contact:  Eduard Roux, LCSW Phone Number: 08/20/2023, 2:59 PM   Clinical Narrative:     Patient will Discharge to: [facility] Discharge Date: 08/20/2023 Family Notified: spouse Transport By: Sharin Mons  Per MD patient is ready for discharge. RN, patient, and facility notified of discharge. Discharge Summary sent to facility. RN given number for [phone]. Ambulance transport requested for patient.   Clinical Social Worker signing off.  Antony Blackbird, MSW, LCSW Clinical Social Worker     Final next level of care: Skilled Nursing Facility Barriers to Discharge: Barriers Resolved   Patient Goals and CMS Choice            Discharge Placement              [facility] at:  ([facility]) Patient to be transferred to facility by: [facility] Name of family member notified: spouse Patient and family notified of of transfer: 08/20/23  Discharge Plan and Services Additional resources added to the After Visit Summary for   In-house Referral: Clinical Social Work                                   Social Drivers of Health (SDOH) Interventions SDOH Screenings   Food Insecurity: No Food Insecurity (08/13/2023)  Housing: Low Risk  (08/13/2023)  Transportation Needs: No Transportation Needs (08/13/2023)  Utilities: Not At Risk (08/13/2023)  Depression (PHQ2-9): Low Risk  (08/06/2018)  Tobacco Use: Medium Risk (08/13/2023)     Readmission Risk Interventions     No data to display

## 2023-08-24 ENCOUNTER — Telehealth: Payer: Self-pay | Admitting: Neurology

## 2023-08-24 NOTE — Telephone Encounter (Signed)
 Pt called stating that she is wanting the nurse to start the process of a new PA for the year for her corticotropin (ACTHAR) 80 UNIT/ML injectable gel  Please advise.

## 2023-08-27 NOTE — Progress Notes (Signed)
 Cardiology Office Note:  .   Date:  08/30/2023  ID:  VERTA RIEDLINGER, DOB [birth year], MRN [id number] PCP: Albina GORMAN Dine, MD   [facility] Providers Cardiologist:  Vinie JAYSON Maxcy, MD  }   History of Present Illness: .   Toni [name] is a 78 y.o. 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    We are seeing the patient posthospitalization where the patient was seen for atrial for with RVR in the setting of UTI and ileus with constipation.  Metoprolol  was stopped and she was transition to amiodarone  after IV infusion of amiodarone  for A-fib RVR.  It was noted during hospitalization she did have a few pauses longest lasting 2.5 seconds causing metoprolol  to be discontinued.  Patient was to remain on apixaban   Metolazone was discontinued due to hyponatremia.    She reports that her dry weight is 196 pounds.  But has been noticing that her weight is been up about 5 pounds and she has been changed to torsemide  30 mg in the morning and 20 mg in the evening.  Heart rate has been elevated as well.  She remains on amiodarone  200 mg twice daily but heart rate has been running over 100 at rest over the last few days.  She denies dizziness.  She is able to sleep and the hospital bed but not flat due to MS and breathing issues but is not on oxygen .    She continues on midodrine  to have help with hypotension and she feels like some of this is causing some fluid retention especially in her abdomen.  She was using it as needed but now they have her on it 3 times a day.  She is at [facility], labs are being drawn along with vital signs and weights daily.  She states that she is not over eating food with salt.  She is having  bowel movements regularly and if she does not she does take MiraLAX  and lactulose .  She is pretty much wheelchair-bound and very sedentary.    ROS: As above otherwise negative.  Studies Reviewed: [name]   EKG Interpretation Date/Time:  Thursday August 30 2023 14:42:46 EST Ventricular Rate:  110 PR Interval:    QRS Duration:  98 QT Interval:  346 QTC Calculation: 468 R Axis:   96  Text Interpretation: Atrial fibrillation with rapid ventricular response Rightward axis Low voltage QRS Cannot rule out Anterior infarct (cited on or before 19-Jun-2023) When compared with ECG of 19-Jun-2023 15:29, Questionable change in initial forces of Septal leads Confirmed by Jerilynn Collar [phone]) on 08/30/2023 2:45:59 PM    Physical Exam:   VS:  Ht 5' 2 (1.575 m)   Wt 204 lb 12.8 oz (92.9 kg)   BMI 37.46 kg/m    Wt Readings from Last 3 Encounters:  08/30/23 204 lb 12.8 oz (92.9 kg)  08/15/23 207 lb 10.8 oz (94.2 kg)  06/19/23 196 lb 6.4 oz (89.1 kg)    GEN: Well nourished, well developed in no acute distress, morbidly obese sitting in a wheelchair NECK: No JVD; No carotid bruits CARDIAC: IRRR, tachycardic, no murmurs, rubs, gallops RESPIRATORY:  Clear to auscultation without rales, wheezing or rhonchi  ABDOMEN: Soft, non-tender, mildly-distended.  Catheter in place. EXTREMITIES:  No edema; No deformity wearing compression hose.  ASSESSMENT AND PLAN: .    A-fib with RVR: Heart rate at rest is 108 210 bpm on amiodarone  200 mg twice daily.  They took her off of metoprolol  due to hypotension.  She was taking 25 mg at at bedtime.  I will go up on her amiodarone  to 400 mg in the a.m. and keep her at 200 mg in the p.m. for the next 3 days to give her a little bit more loading.  She will then go back to 100 mg twice daily.  If heart rate remains greater than 105 bpm 30 matter if staff is to call us .  May need medication adjustments or additions.  I am reluctant to place her back on metoprolol  due to  hypotension.  Continue apixaban  5 mg twice daily.  2.  HFrEF: Repeating echocardiogram.  Most recent echo 08/22/2021 EF of 45 to 50%.  Left atrium was normal in size at that time.  Global hypokinesis of the left ventricle.  She is retaining fluid very easily this may be related to the midodrine  along with A-fib rate not controlled.  However we will compared to previous echo to compare changes from previous echo.  3.  Chronic diastolic heart failure: She has been increased on her torsemide  at skilled nursing facility to 30 mg in the morning and 20 mg in the evening.  She does have a catheter and has an output of approximately 2000 cc each day.  Her weight is up slightly from discharge weight from the hospital at 196 pounds up to 204 pounds.  She is retaining fluid which may be related to the midodrine .  Will try a slower heart rate down which may help with better cardiac output allowing better diuresis.  4.  Hyponatremia: Labs are being followed by primary provider through skilled nursing facility.  Consider workup for SIADH.         Signed, Lamarr HERO. Jerilynn CHOL, ANP, AACC

## 2023-08-28 ENCOUNTER — Telehealth: Payer: Self-pay | Admitting: *Deleted

## 2023-08-28 NOTE — Telephone Encounter (Signed)
 Patient called and requested a referral back to Dr Arelia Sneddon for a follow up and [name].

## 2023-08-29 ENCOUNTER — Other Ambulatory Visit (HOSPITAL_COMMUNITY): Payer: Self-pay

## 2023-08-29 ENCOUNTER — Telehealth: Payer: Self-pay

## 2023-08-29 MED ORDER — ACTHAR 80 UNIT/ML IJ GEL: 80.0000 [IU] | INTRAMUSCULAR | 5 refills | Status: DC

## 2023-08-29 NOTE — Telephone Encounter (Signed)
 Noted and patient made aware script sent to [facility]

## 2023-08-29 NOTE — Telephone Encounter (Signed)
 I was asked to do a PA for [place]-however per test claim no PA is needed.

## 2023-08-29 NOTE — Telephone Encounter (Signed)
 Called the patient and made aware a test claim was ran and it is kicking back stating no PA is needed. Advised that a refill will be sent [facility] to be filled and if there are any issues, she should let us know.

## 2023-08-30 ENCOUNTER — Ambulatory Visit: Payer: Medicare Other | Attending: Adult Health | Admitting: Adult Health

## 2023-08-30 ENCOUNTER — Encounter: Payer: Self-pay | Admitting: Adult Health

## 2023-08-30 VITALS — BP 132/82 | HR 109 | Ht 62.0 in | Wt 204.8 lb

## 2023-08-30 DIAGNOSIS — I5030 Unspecified diastolic (congestive) heart failure: Secondary | ICD-10-CM | POA: Insufficient documentation

## 2023-08-30 DIAGNOSIS — I509 Heart failure, unspecified: Secondary | ICD-10-CM | POA: Diagnosis present

## 2023-08-30 DIAGNOSIS — I428 Other cardiomyopathies: Secondary | ICD-10-CM | POA: Insufficient documentation

## 2023-08-30 DIAGNOSIS — I4821 Permanent atrial fibrillation: Secondary | ICD-10-CM | POA: Insufficient documentation

## 2023-08-30 DIAGNOSIS — I4891 Unspecified atrial fibrillation: Secondary | ICD-10-CM | POA: Insufficient documentation

## 2023-08-30 DIAGNOSIS — I5032 Chronic diastolic (congestive) heart failure: Secondary | ICD-10-CM | POA: Diagnosis present

## 2023-08-30 DIAGNOSIS — G4737 Central sleep apnea in conditions classified elsewhere: Secondary | ICD-10-CM | POA: Insufficient documentation

## 2023-08-30 DIAGNOSIS — I83893 Varicose veins of bilateral lower extremities with other complications: Secondary | ICD-10-CM | POA: Insufficient documentation

## 2023-08-30 NOTE — Patient Instructions (Signed)
 Medication Instructions:  Amiodarone   ( For  Three Days Take 400 mg In the Morning and 200 mg In the Evening . Then Resume 200 mg Twice daily). *If you need a refill on your cardiac medications before your next appointment, please call your pharmacy*   Lab Work: No labs If you have labs (blood work) drawn today and your tests are completely normal, you will receive your results only by: MyChart Message (if you have MyChart) OR A paper copy in the mail If you have any lab test that is abnormal or we need to change your treatment, we will call you to review the results.   Testing/Procedures: [address], [address] Your physician has requested that you have an echocardiogram. Echocardiography is a painless test that uses sound waves to create images of your heart. It provides your doctor with information about the size and shape of your heart and how well your heart's chambers and valves are working. This procedure takes approximately one hour. There are no restrictions for this procedure. Please do NOT wear cologne, perfume, aftershave, or lotions (deodorant is allowed). Please arrive 15 minutes prior to your appointment time.  Please note: We ask at that you not bring children with you during ultrasound (echo/ vascular) testing. Due to room size and safety concerns, children are not allowed in the ultrasound rooms during exams. Our front office staff cannot provide observation of children in our lobby area while testing is being conducted. An adult accompanying a patient to their appointment will only be allowed in the ultrasound room at the discretion of the ultrasound technician under special circumstances. We apologize for any inconvenience.    Follow-Up: At [facility], you and your health needs are our priority.  As part of our continuing mission to provide you with exceptional heart care, we have created designated Provider Care Teams.  These Care Teams include your  primary Cardiologist (physician) and Advanced Practice Providers (APPs -  Physician Assistants and Nurse Practitioners) who all work together to provide you with the care you need, when you need it.  We recommend signing up for the patient portal called MyChart.  Sign up information is provided on this After Visit Summary.  MyChart is used to connect with patients for Virtual Visits (Telemedicine).  Patients are able to view lab/test results, encounter notes, upcoming appointments, etc.  Non-urgent messages can be sent to your provider as well.   To learn more about what you can do with MyChart, go to [email or web address].    Your next appointment:   6 week(s)  Provider:   Lamarr Satterfield, DNP, ANP

## 2023-08-31 ENCOUNTER — Emergency Department (HOSPITAL_COMMUNITY): Payer: Medicare Other

## 2023-08-31 ENCOUNTER — Emergency Department (HOSPITAL_COMMUNITY)
Admission: EM | Admit: 2023-08-31 | Discharge: 2023-08-31 | Disposition: A | Discharge status: Skilled Nursing Facility | Payer: Medicare Other

## 2023-08-31 ENCOUNTER — Encounter (HOSPITAL_COMMUNITY): Payer: Self-pay

## 2023-08-31 ENCOUNTER — Telehealth: Payer: Self-pay | Admitting: Internal Medicine

## 2023-08-31 ENCOUNTER — Ambulatory Visit: Payer: Medicare Other | Admitting: Adult Health

## 2023-08-31 ENCOUNTER — Other Ambulatory Visit: Payer: Self-pay

## 2023-08-31 DIAGNOSIS — E66812 Obesity, class 2: Secondary | ICD-10-CM

## 2023-08-31 DIAGNOSIS — G4730 Sleep apnea, unspecified: Secondary | ICD-10-CM | POA: Diagnosis present

## 2023-08-31 DIAGNOSIS — I5022 Chronic systolic (congestive) heart failure: Secondary | ICD-10-CM | POA: Diagnosis not present

## 2023-08-31 DIAGNOSIS — I4891 Unspecified atrial fibrillation: Secondary | ICD-10-CM | POA: Diagnosis not present

## 2023-08-31 DIAGNOSIS — Z6837 Body mass index (BMI) 37.0-37.9, adult: Secondary | ICD-10-CM

## 2023-08-31 DIAGNOSIS — Z7901 Long term (current) use of anticoagulants: Secondary | ICD-10-CM | POA: Diagnosis not present

## 2023-08-31 LAB — CBC
HCT: 40.9 % (ref 36.0–46.0)
Hemoglobin: 13.5 g/dL (ref 12.0–15.0)
MCH: 31.5 pg (ref 26.0–34.0)
MCHC: 33 g/dL (ref 30.0–36.0)
MCV: 95.3 fL (ref 80.0–100.0)
Platelets: 266 10*3/uL (ref 150–400)
RBC: 4.29 MIL/uL (ref 3.87–5.11)
RDW: 13 % (ref 11.5–15.5)
WBC: 6.3 10*3/uL (ref 4.0–10.5)
nRBC: 0 % (ref 0.0–0.2)

## 2023-08-31 LAB — BASIC METABOLIC PANEL
Anion gap: 11 (ref 5–15)
BUN: 18 mg/dL (ref 8–23)
CO2: 33 mmol/L — ABNORMAL HIGH (ref 22–32)
Calcium: 9.1 mg/dL (ref 8.9–10.3)
Chloride: 94 mmol/L — ABNORMAL LOW (ref 98–111)
Creatinine, Ser: 0.67 mg/dL (ref 0.44–1.00)
GFR, Estimated: >60 mL/min (ref >60)
Glucose, Bld: 85 mg/dL (ref 70–99)
Potassium: 4 mmol/L (ref 3.5–5.1)
Sodium: 138 mmol/L (ref 135–145)

## 2023-08-31 LAB — MAGNESIUM: Magnesium: 2.4 mg/dL (ref 1.7–2.4)

## 2023-08-31 MED ORDER — GABAPENTIN 300 MG PO CAPS
300.0000 mg | ORAL_CAPSULE | Freq: Once | ORAL | Status: AC
  Administered 2023-08-31: 300 mg via ORAL
  Filled 2023-08-31: qty 1

## 2023-08-31 MED ORDER — AMIODARONE HCL 200 MG PO TABS
200.0000 mg | ORAL_TABLET | Freq: Every day | ORAL | Status: DC
  Administered 2023-08-31: 200 mg via ORAL
  Filled 2023-08-31: qty 1

## 2023-08-31 MED ORDER — METOPROLOL TARTRATE 25 MG PO TABS
25.0000 mg | ORAL_TABLET | Freq: Once | ORAL | Status: AC
  Administered 2023-08-31: 25 mg via ORAL
  Filled 2023-08-31: qty 1

## 2023-08-31 MED ORDER — BACLOFEN 10 MG PO TABS
20.0000 mg | ORAL_TABLET | Freq: Once | ORAL | Status: AC
  Administered 2023-08-31: 20 mg via ORAL
  Filled 2023-08-31: qty 2

## 2023-08-31 MED ORDER — METOPROLOL TARTRATE 50 MG PO TABS: 25.0000 mg | ORAL_TABLET | Freq: Every day | ORAL | 0 refills | Status: DC

## 2023-08-31 NOTE — ED Triage Notes (Signed)
 Pt bib ems from [facility] fore a fib RVR. Pt was at her discharge cardiology follow up appt yesterday with a rate of 108, they doubled up her amiodarone  dose yesterday and today but pt still ranging from 100-130 so pt sent here. Pt has no complaints. Pt has chronic foley catheter.

## 2023-08-31 NOTE — ED Notes (Signed)
 Assumed care of pt, found her alter and oriented in bed.  Pt was given evening meds.  No complaints or issues at this time.

## 2023-08-31 NOTE — Telephone Encounter (Signed)
 Nurse- Tillman from [facility] states that the patient was started on Amiodarone  400 mg this morning- first dose (orders changed yesterday- to be on 400 mg every morning and 200 mg every evening for 3 days- then go to 200 in morning and 200 in the evening)   Now- it has been 3 hours since dose and the pt's hr= 112. Pt does not feel dizzy or out of breath- she can feel it and it is making her worried= high anxiety- was given a xanax  0.25 at 0830. The patient does not feel like it has helped at all.    The patient's Hr has been staying 110-120's; She was told to call if the patient's heart rate was above 105. Tillman [phone]

## 2023-08-31 NOTE — Discharge Instructions (Addendum)
 Start taking the metoprolol in addition to your other previously prescribed medications.  Follow-up with your cardiology doctors to be rechecked

## 2023-08-31 NOTE — Progress Notes (Signed)
 ON-CALL CARDIOLOGY 08/31/23  Patient's name: Toni [name].   MRN: [id number].    DOB: 1946-05-29 Primary care provider: Albina GORMAN Dine, MD. Primary cardiologist: Dr. Mona  Interaction regarding this patient's care today: Patient was seen by cardiology yesterday 08/30/2023.    Review of her the progress note notes that amiodarone  dose was changed to help further improve rate control strategy.  However, due to concerns for elevated heart rates patient presents to the hospital for further evaluation and management.  ER physician reached out to discuss her care and to possibly discharge her from the ED with medication adjustments.  Temp:  [97.9 F (36.6 C)] 97.9 F (36.6 C) (01/10 1306) Pulse Rate:  [69-109] 69 (01/10 1430) Cardiac Rhythm: Atrial fibrillation (01/10 1434) Resp:  [13-28] 19 (01/10 1536) BP: (102-107)/(66-87) 103/79 (01/10 1500) SpO2:  [90 %-100 %] 90 % (01/10 1430) Weight:  [92.8 kg] 92.8 kg (01/10 1307)   Impression: Atrial fibrillation Chronic heart failure with mildly reduced LVEF Obesity due to excess calories. Sleep apnea.  Recommendations: I reviewed the discharge summary from her last hospitalization dated 08/20/2023.  There were concerns that patient was having pause of 2.5 seconds and therefore metoprolol  was discontinued.  During these pauses the underlying rhythm was atrial fibrillation.  Patient has been on appropriate amounts of amiodarone  during her last hospitalization and also as outpatient.  Her heart rates during this hospitalization have been well-controlled predominately less than 110 bpm.  Recommended continuing the current dose of amiodarone  as she is prescribed.  Start Toprol -XL 25 mg p.o. daily.  Arranged outpatient follow-up with A-fib clinic on 09/05/2023.  Patient's presentation, review of electronic medical records, labs, EKG from today, were discussed with the ED physician Dr. Randol over the phone.  Telephone encounter total  time: 9 mintues.  Madonna Large, DO, [facility]  [facility]  [address] [place], [place] [address] 08/31/2023 4:27 PM

## 2023-08-31 NOTE — ED Provider Notes (Signed)
 Toni [name] EMERGENCY DEPARTMENT AT [facility] Provider Note   CSN: [id number] Arrival date & time: 08/31/23  1304     History  Chief Complaint  Patient presents with   Atrial Fibrillation    Toni [name] is a 78 y.o. female.  78 year old female presenting emergency department for A-fib RVR.  Recent hospitalization, reported having follow-up visit with cardiology yesterday.  They change dosage of her amiodarone .  She was told to contact their office if her heart rate remained above 110 which she did this morning.  She states she feels washed out, but otherwise her normal self.  Not having any chest pain or shortness of breath.  No abdominal pain.    Atrial Fibrillation       Home Medications Prior to Admission medications   Medication Sig Start Date End Date Taking? Authorizing Provider  acetaminophen  (TYLENOL ) 500 MG tablet Take 1,000 mg by mouth every 8 (eight) hours as needed for fever or headache (pain).   Yes [provider]  ALPRAZolam  (XANAX ) 0.5 MG tablet Take 1 tablet (0.5 mg total) by mouth at bedtime. May also take 1 tablet every 12 hours as needed anxiety Patient taking differently: Take 0.5 mg by mouth See admin instructions. Take 0.5 mg (1 tablet) by mouth at bedtime. May also take an additional 1 tablet every 12 hours as needed for anxiety. 07/17/22  Yes Ghimire, Donalda HERO, MD  amiodarone  (PACERONE ) 200 MG tablet Take 1 tablet (200 mg total) by mouth 2 (two) times daily for 14 days, THEN 1 tablet (200 mg total) daily. Follow up with cardiology outpatient for refills and continued recommendations. Patient taking differently: Take 2 tablets (400mg ) in the morning. 08/20/23 10/03/23 Yes Perri DELENA Meliton Mickey., MD  amiodarone  (PACERONE ) 200 MG tablet Take 200 mg by mouth every evening.   Yes [provider]  apixaban  (ELIQUIS ) 5 MG TABS tablet Take 5 mg by mouth 2 (two) times daily.   Yes [provider]  baclofen  (LIORESAL ) 20 MG  tablet Take 20 mg by mouth daily as needed for muscle spasms.   Yes [provider]  Biotin  10000 MCG TABS Take 1 tablet by mouth daily.   Yes [provider]  bisacodyl  (DULCOLAX) 10 MG suppository Place 10 mg rectally See admin instructions. Insert 10 mg (1 suppository) rectally once daily on Sunday, Tuesday, Thursday. May use an additional suppository once daily as needed for constipation.   Yes [provider]  buPROPion  (WELLBUTRIN  XL) 150 MG 24 hr tablet Take 1 tablet (150 mg total) by mouth daily. 08/20/23  Yes Perri DELENA Meliton Mickey., MD  cetirizine  (ZYRTEC ) 10 MG tablet Take 10 mg by mouth daily as needed for allergies.   Yes [provider]  cholecalciferol  (VITAMIN D3) 25 MCG (1000 UNIT) tablet Take 2,000 Units by mouth 2 (two) times daily.   Yes [provider]  clotrimazole  (LOTRIMIN ) 1 % cream Apply 1 Application topically 2 (two) times daily. To perineal area and abdominal folds   Yes [provider]  corticotropin  (ACTHAR ) 80 UNIT/ML injectable gel Inject 1 mL (80 Units total) into the skin See admin instructions for 10 days. Inject 80 units as needed (about 5 times a month) 08/29/23 09/08/23 Yes Dohmeier, Dedra, MD  diphenhydrAMINE  (BENADRYL ) 25 mg capsule Take 25 mg by mouth 3 (three) times daily as needed (minor itching, irritation).   Yes [provider]  docusate sodium  (COLACE) 100 MG capsule Take 100 mg by mouth 2 (two)  times daily.   Yes [provider]  Emollient (AQUAPHOR ADV PROTECT HEALING) 41 % OINT Apply 1 application  topically See admin instructions. Apply topically every shift. Apply to redness in crease of buttocks every shift with and with each toileting times.   Yes [provider]  Emollient (CERAVE DAILY MOISTURIZING) LOTN Apply 1 application  topically daily. To groin, inguinal area   Yes [provider]  famotidine  (PEPCID ) 20 MG tablet Take 20 mg by mouth 2 (two) times daily.    Yes [provider]  fluticasone  (FLONASE ) 50 MCG/ACT nasal spray Place 1 spray into both nostrils daily as needed (for nasal congestion).   Yes [provider]  gabapentin  (NEURONTIN ) 300 MG capsule Mrs. Fath will take 300 mg of gabapentin  at 6 AM, 10 AM, 2 PM and 6 PM and 600 mg at 10 PM by mouth. Patient taking differently: Take 300 mg by mouth 4 (four) times daily. 03/07/21  Yes Dohmeier, Dedra, MD  lactulose  (CHRONULAC ) 10 GM/15ML solution Take 15 mLs (10 g total) by mouth 2 (two) times daily as needed for mild constipation. Patient taking differently: Take 10 g by mouth 2 (two) times daily. 07/17/22  Yes Ghimire, Donalda HERO, MD  levothyroxine  (SYNTHROID ) 75 MCG tablet Take 75 mcg by mouth daily before breakfast.   Yes [provider]  melatonin 5 MG TABS Take 5 mg by mouth at bedtime.   Yes [provider]  Menthol , Topical Analgesic, (BIOFREEZE COOL THE PAIN) 4 % GEL Apply 1 application  topically 3 (three) times daily. To left calf   Yes [provider]  midodrine  (PROAMATINE ) 5 MG tablet Take 1 tablet (5 mg total) by mouth 3 (three) times daily with meals. Patient taking differently: Take 5 mg by mouth every 8 (eight) hours as needed (SBP < 100). 08/20/23  Yes Perri DELENA Meliton Mickey., MD  ondansetron  (ZOFRAN -ODT) 4 MG disintegrating tablet Take 4 mg by mouth 4 (four) times daily as needed for nausea.   Yes [provider]  oxybutynin  (DITROPAN ) 5 MG tablet Take 5 mg by mouth 2 (two) times daily as needed (overactive bladder).   Yes [provider]  phenazopyridine  (PYRIDIUM ) 100 MG tablet Take 100 mg by mouth every 8 (eight) hours as needed (dysuria).   Yes [provider]  phenylephrine -shark liver oil-mineral oil-petrolatum (PREPARATION H) 0.25-14-74.9 % rectal ointment Place 1 Application rectally 3 (three) times daily as needed for hemorrhoids.   Yes [provider]  Polyethyl Glycol-Propyl Glycol (SYSTANE)  0.4-0.3 % SOLN Place 1 drop into both eyes in the morning and at bedtime.   Yes [provider]  polyethylene glycol (MIRALAX  / GLYCOLAX ) 17 g packet Take 17 g by mouth daily as needed for mild constipation, moderate constipation or severe constipation.   Yes [provider]  potassium chloride  SA (KLOR-CON ) 20 MEQ tablet Take 2 tablets (40 mEq total) by mouth daily. Patient taking differently: Take 40 mEq by mouth in the morning. 01/03/21  Yes Hilty, Vinie BROCKS, MD  promethazine  (PHENERGAN ) 12.5 MG tablet Take 12.5 mg by mouth every 8 (eight) hours as needed for nausea or vomiting.   Yes [provider]  Prucalopride Succinate  (MOTEGRITY ) 2 MG TABS Take 2 mg by mouth daily before breakfast.   Yes [provider]  Psyllium (METAMUCIL SMOOTH TEXTURE PO) Take 1 packet by mouth 2 (two) times daily.   Yes [provider]  Salicylic Acid-Cleanser 6 % CREAM KIT Apply 1 each topically  See admin instructions. Apply topically once a day on Monday, Wednesday, Saturday to lesion on foot 3x weekly. Once soft, gently file with disposible nail file weekly, repeat until resolved.   Yes [provider]  senna (SENOKOT) 8.6 MG TABS tablet Take 17.2 mg by mouth 2 (two) times daily.   Yes [provider]  silver  sulfADIAZINE (SILVADENE) 1 % cream Apply 1 Application topically 2 (two) times daily. 08/23/23  Yes [provider]  sodium phosphate  (FLEET) 7-19 GM/118ML ENEM Place 1 enema rectally daily as needed (constipation).   Yes [provider]  torsemide  (DEMADEX ) 20 MG tablet Take 1.5 tablets (30 mg total) by mouth daily. Weigh yourself daily.  If you gain 2-3 lbs in a day or 5 lbs in a week or develop worsening swelling, take an extra dose of lasix .  You should follow up your dosing with your PCP/ cardiologist outpatient to determine if the dose needs further adjustment. Patient taking differently: Take 30 mg by mouth daily. Take an extra dose  with weight gain  of 2-3lbs in a day and 5lbs in a day or develop worsening swelling. 08/20/23  Yes Perri DELENA Meliton Mickey., MD  torsemide  (DEMADEX ) 20 MG tablet Take 20 mg by mouth at bedtime.   Yes [provider]  triamcinolone cream (KENALOG) 0.1 % Apply 1 Application topically 2 (two) times daily.   Yes [provider]      Allergies    Other, Hydrocodone , Oxycontin  [oxycodone ], Zoloft [sertraline], Chloraseptic [phenol], Macrobid  [nitrofurantoin ], Vioxx [rofecoxib], and Hibiclens  [chlorhexidine  gluconate]    Review of Systems   Review of Systems  Physical Exam Updated Vital Signs BP 103/79   Pulse 69   Temp 97.9 F (36.6 C) (Oral)   Resp 19   Ht 5' 2 (1.575 m)   Wt 92.8 kg   SpO2 90%   BMI 37.42 kg/m  Physical Exam Vitals and nursing note reviewed.  Constitutional:      General: She is not in acute distress.    Appearance: She is not toxic-appearing.  HENT:     Nose: Nose normal.     Mouth/Throat:     Mouth: Mucous membranes are moist.  Eyes:     Conjunctiva/sclera: Conjunctivae normal.  Cardiovascular:     Rate and Rhythm: Tachycardia present. Rhythm irregular.  Pulmonary:     Effort: Pulmonary effort is normal.  Abdominal:     General: Abdomen is flat. There is no distension.     Tenderness: There is no abdominal tenderness. There is no guarding.  Skin:    General: Skin is warm.     Capillary Refill: Capillary refill takes less than 2 seconds.  Neurological:     Mental Status: She is alert and oriented to person, place, and time.  Psychiatric:        Mood and Affect: Mood normal.        Behavior: Behavior normal.     ED Results / Procedures / Treatments   Labs (all labs ordered are listed, but only abnormal results are displayed) Labs Reviewed  BASIC METABOLIC PANEL - Abnormal; Notable for the following components:      Result Value   Chloride 94 (*)    CO2 33 (*)    All other components within normal limits  CBC  MAGNESIUM      EKG EKG Interpretation Date/Time:  Friday August 31 2023 13:05:05 EST Ventricular Rate:  103 PR Interval:    QRS Duration:  101 QT Interval:  374 QTC Calculation: 490 R Axis:   99  Text Interpretation: Atrial fibrillation Right axis deviation Borderline prolonged QT interval Confirmed by Neysa Clap [id number]) on 08/31/2023 1:21:13 PM  Radiology DG Chest Portable 1 View Result Date: 08/31/2023 CLINICAL DATA:  Atrial fibrillation. EXAM: PORTABLE CHEST 1 VIEW COMPARISON:  Chest radiographs 11/20/2022 and 07/13/2022 FINDINGS: Cardiac silhouette is mildly to moderately enlarged. Mediastinal contours are within normal limits. The lungs are clear. No pulmonary edema, pleural effusion, or pneumothorax. Mild dextrocurvature of the midthoracic spine. Moderate disc space narrowing, endplate sclerosis, and peripheral osteophytosis. IMPRESSION: Mild to moderate cardiomegaly. No acute lung process. Electronically Signed   By: Tanda Lyons M.D.   On: 08/31/2023 13:45    Procedures Procedures    Medications Ordered in ED Medications - No data to display  ED Course/ Medical Decision Making/ A&P Clinical Course as of 08/31/23 1556  Fri Aug 31, 2023  1322 Seen by cardiolgy yesterday: 1. A-fib with RVR: Heart rate at rest is 108 210 bpm on amiodarone  200 mg twice daily.  They took her off of metoprolol  due to hypotension.  She was taking 25 mg at at bedtime.  I will go up on her amiodarone  to 400 mg in the a.m. and keep her at 200 mg in the p.m. for the next 3 days to give her a little bit more loading.  She will then go back to 100 mg twice daily.  If heart rate remains greater than 105 bpm 30 matter if staff is to call us .  May need medication adjustments or additions.  I am reluctant to place her back on metoprolol  due to hypotension.  Continue apixaban  5 mg twice daily.   2.  HFrEF: Repeating echocardiogram.  Most recent echo 08/22/2021 EF of 45 to 50%.  Left atrium was normal in size at that  time.  Global hypokinesis of the left ventricle.  She is retaining fluid very easily this may be related to the midodrine  along with A-fib rate not controlled.  However we will compared to previous echo to compare changes from previous echo.   3.  Chronic diastolic heart failure: She has been increased on her torsemide  at skilled nursing facility to 30 mg in the morning and 20 mg in the evening.  She does have a catheter and has an output of approximately 2000 cc each day.  Her weight is up slightly from discharge weight from the hospital at 196 pounds up to 204 pounds.  She is retaining fluid which may be related to the midodrine .  Will try a slower heart rate down which may help with better cardiac output allowing better diuresis.   4.  Hyponatremia: Labs are being followed by primary provider through skilled nursing facility.  Consider workup for SIADH.     [TY]  1545 Awaiting cardiology callback regarding plan; Care signed out to Dr. Randol.  [TY]    Clinical Course User Index [TY] Neysa Clap PARAS, DO                                 Medical Decision Making Is well-appearing 78 year old female presenting emergency department for A-fib RVR.  Recent hospitalization and medication change to amiodarone  for her A-fib.  Heart rates in the 110s to 120s.  Largely asymptomatic.  No significant metabolic derangements.  Normal kidney function.  No anemia.  No leukocytosis to suggest systemic infection.  Magnesium  normal.  Cardiology consulted.  Spoke with coordinator will have attending call back regarding options for patient's A-fib RVR.  Medication adjustment/discharge versus admit.  See ED course for final MDM/disposition.  Amount and/or Complexity of Data Reviewed Labs: ordered. Radiology: ordered.         Final Clinical Impression(s) / ED Diagnoses Final diagnoses:  None    Rx / DC Orders ED Discharge Orders     None         Neysa Caron PARAS, DO 08/31/23 1556

## 2023-08-31 NOTE — Telephone Encounter (Signed)
 STAT if HR is under 50 or over 120 (normal HR is 60-100 beats per minute)  What is your heart rate? 110-122  Do you have a log of your heart rate readings (document readings)?   Do you have any other symptoms? No   [facility] is calling to report change in HR since starting on higher dose of Amiodarone  today.

## 2023-08-31 NOTE — Telephone Encounter (Signed)
 Per provider fax referral and records to Dr [facility] office

## 2023-08-31 NOTE — Telephone Encounter (Signed)
 Spoke with Lamarr Satterfield, NP- gave current patient information. Advised this patient to go to the ER for evaluation and management.  Called and spoke with Tillman, Nurse at [facility]- gave the information above and she verbalized understanding, patient to go to the ER.

## 2023-08-31 NOTE — ED Provider Notes (Signed)
 Case discussed with Dr. Michele.  Recommends adding a low-dose of metoprolol  25 mg once daily.  Patient states she was told she would be permanently in A-fib so they had not discussed cardioversion with her.  Pt can be discharged, outpt follow up with a fib clinic    Randol Simmonds, MD 08/31/23 1626

## 2023-09-04 ENCOUNTER — Ambulatory Visit: Payer: Medicare Other | Admitting: Gastroenterology

## 2023-09-05 ENCOUNTER — Ambulatory Visit (HOSPITAL_COMMUNITY): Payer: Medicare Other | Admitting: Physician Assistant

## 2023-09-05 ENCOUNTER — Encounter (HOSPITAL_COMMUNITY): Payer: Self-pay

## 2023-09-17 NOTE — Telephone Encounter (Signed)
Patient has an appt with Dr Arelia Sneddon on 10/01/23

## 2023-09-20 ENCOUNTER — Ambulatory Visit (HOSPITAL_COMMUNITY): Payer: Medicare Other | Attending: Cardiology

## 2023-09-20 DIAGNOSIS — G4737 Central sleep apnea in conditions classified elsewhere: Secondary | ICD-10-CM

## 2023-09-20 DIAGNOSIS — I4891 Unspecified atrial fibrillation: Secondary | ICD-10-CM | POA: Insufficient documentation

## 2023-09-20 DIAGNOSIS — I83893 Varicose veins of bilateral lower extremities with other complications: Secondary | ICD-10-CM

## 2023-09-20 DIAGNOSIS — I509 Heart failure, unspecified: Secondary | ICD-10-CM | POA: Insufficient documentation

## 2023-09-20 DIAGNOSIS — I5032 Chronic diastolic (congestive) heart failure: Secondary | ICD-10-CM | POA: Diagnosis present

## 2023-09-20 DIAGNOSIS — I4821 Permanent atrial fibrillation: Secondary | ICD-10-CM

## 2023-09-20 DIAGNOSIS — I5030 Unspecified diastolic (congestive) heart failure: Secondary | ICD-10-CM | POA: Diagnosis present

## 2023-09-20 DIAGNOSIS — I428 Other cardiomyopathies: Secondary | ICD-10-CM

## 2023-09-20 LAB — ECHOCARDIOGRAM COMPLETE
Est EF: 45
S' Lateral: 3.5 cm

## 2023-09-21 ENCOUNTER — Ambulatory Visit (INDEPENDENT_AMBULATORY_CARE_PROVIDER_SITE_OTHER): Payer: Medicare Other | Admitting: Gastroenterology

## 2023-09-21 ENCOUNTER — Encounter: Payer: Self-pay | Admitting: Gastroenterology

## 2023-09-21 VITALS — BP 118/70 | HR 71

## 2023-09-21 DIAGNOSIS — K649 Unspecified hemorrhoids: Secondary | ICD-10-CM

## 2023-09-21 DIAGNOSIS — K592 Neurogenic bowel, not elsewhere classified: Secondary | ICD-10-CM

## 2023-09-21 DIAGNOSIS — G35 Multiple sclerosis: Secondary | ICD-10-CM | POA: Diagnosis not present

## 2023-09-21 DIAGNOSIS — K5909 Other constipation: Secondary | ICD-10-CM

## 2023-09-21 DIAGNOSIS — K219 Gastro-esophageal reflux disease without esophagitis: Secondary | ICD-10-CM

## 2023-09-21 MED ORDER — LACTULOSE 10 GM/15ML PO SOLN: 10.0000 g | Freq: Three times a day (TID) | ORAL | 11 refills | Status: AC

## 2023-09-21 MED ORDER — HYDROCORTISONE ACETATE 25 MG RE SUPP: 25.0000 mg | Freq: Two times a day (BID) | RECTAL | 4 refills | Status: AC

## 2023-09-21 NOTE — Progress Notes (Signed)
I agree with the assessment and plan as outlined Ms. McMichael.

## 2023-09-21 NOTE — Progress Notes (Signed)
Chief Complaint: Constipation Primary GI MD: Dr. Orvan Falconer  HPI: 78 year old female history of multiple sclerosis, A-fib (on Eliquis), systolic and diastolic CHF, spastic quadriparesis s/p intrathcal baclofen pump, bladder spasms, neurogenic bladder, Cushing's disease, depression, anxiety, OSA presents for evaluation of constipation  Patient has had multiple admissions over the past year for various reasons including A-fib with RVR and sepsis secondary to UTI During one of her admission she was found to have ileus secondary to constipation.  Seen recently at heart failure clinic. History of hypotension as well. She resides at a SNF and is wheelchair bound.  echo 08/22/2021 EF of 45 to 50%   Last seen October 2023 by Hyacinth Meeker, PA Patient has longstanding history of chronic constipation.  Has tried multiple medications in the past.  At the time of her last appointment she was doing fairly well on bowel regimen which included Metamucil, Motegrity, Colace, and lactulose.  She also has chronic left-sided abdominal pain related to her chronic constipation.  Discussed the use of AI scribe software for clinical note transcription with the patient, who gave verbal consent to proceed.  History of Present Illness   The patient, with multiple sclerosis and neurogenic bowel, presents for a review of her medication regimen and management of constipation and reflux. She is accompanied by her caregiver, Verlon Au. She was referred by a nurse practitioner at the nursing home for evaluation of her medication regimen.  The patient has a history of chronic constipation complicated by neurogenic bowel due to multiple sclerosis. She experiences variability in bowel movements, sometimes going two to three times a day and then having a day or two without any bowel movements. Her regimen includes Metamucil, Motegrity, Colace, and lactulose, which generally manage her symptoms. She experiences abdominal discomfort  and distention, particularly in the sigmoid colon area, associated with constipation. X-rays taken by the nurse practitioner show stool accumulation but no ileus. Occasionally, she receives enemas or suppositories at the nursing home to manage constipation.  Regarding reflux, she is currently taking Pepcid 20 mg. She previously took omeprazole in the morning and Pepcid at night. Her reflux is generally well-controlled, although she occasionally experiences burping after drinking. She has a history of chronic cough, initially attributed to reflux, but has not experienced any recent coughing issues related to reflux.  She mentions experiencing hemorrhoidal symptoms, She uses Preparation H externally.  She feels she would benefit from suppostiories     PREVIOUS GI WORKUP   CTAPW contrast 08/13/23 IMPRESSION: 1. Foley catheter in the bladder. Catheter tip appears to be within the bladder wall with adjacent bladder wall thickening and fat stranding. 2. Unchanged small enhancing mass arising from the superior pole of the left kidney measuring 1.4 x 1.1 cm, consistent with small renal cell carcinoma. 3. Large burden of stool throughout the colon and rectum.  03/07/2022 CT then pelvis with contrast with moderate bilateral hydronephrosis, diffuse thickened bladder wall most consistent with cystitis and air within the endometrium which may have been introduced by recent instrumentation, no bowel obstruction, mild fatty liver and aortic atherosclerosis.    2006 colonoscopy normal with Dr. Kinnie Scales.    03/15/2009 EGD with candidal pharyngitis and nonerosive esophagitis.  Past Medical History:  Diagnosis Date   Abnormality of gait 03/01/2015   Acute metabolic encephalopathy 06/04/2020   Anxiety    Arthritis    "knees" (01/19/2016)   Asthma    as a child    Atrial fibrillation (HCC)    Back pain  Bilateral carpal tunnel syndrome 10/04/2020   Cancer (HCC)    / RENAL CELL CARCINOMA - GETS CT SCAN  EVERY 6 MONTHS TO WATCH    CHF (congestive heart failure) (HCC)    Chronic pain    "nerve pain from the MS"   Complex atypical endometrial hyperplasia    Constipation    COVID-19 long hauler manifesting chronic dyspnea 09/13/2020   COVID-19 virus infection 08/23/2019   Depression    DVT (deep venous thrombosis) (HCC) 1983   "RLE; may have just been phlebitis; it was before the age of dopplers"   Dysrhythmia    afib    Edema    varicose veins with severe venous insuff in R and L GSV' ablation of R GSV 2012   GERD (gastroesophageal reflux disease)    HLD (hyperlipidemia)    Hypertension    Hypotension    Hypothyroidism    Joint pain    Multiple sclerosis (HCC)    has had this may 1989-[facility] reg doc   OSA on CPAP    bipap machine - SLEEP APNEA WORSE SINCE COVID IN 1/21 PER PATIENT SETTING HAS INCREASED FROM 9 TO 30    Osteoarthritis    PAF (paroxysmal atrial fibrillation) (HCC)    on coumadin; documented on monitor 06/2010 - no longer takes per [facility] 02/09/21   Pneumonia 11/2011   Recurrent UTI 04/07/2020   Sepsis (HCC) 06/04/2020   Sepsis secondary to UTI (HCC) 11/20/2022   Sleep apnea    uses sleep apnea machine    Past Surgical History:  Procedure Laterality Date   ANTERIOR CERVICAL DECOMP/DISCECTOMY FUSION  01/2004   CARDIAC CATHETERIZATION  06/22/2010   normal coronary arteries, PAF   CATARACT EXTRACTION, BILATERAL Bilateral    CHILD BIRTHS     X2   DILATATION & CURETTAGE/HYSTEROSCOPY WITH MYOSURE N/A 01/15/2020   Procedure: DILATATION & CURETTAGE/HYSTEROSCOPY WITH MYOSURE;  Surgeon: Richardean Chimera, MD;  Location: [facility];  Service: Gynecology;  Laterality: N/A;  pt in wheelchair-has MS   DILATION AND CURETTAGE OF UTERUS     DILATION AND CURETTAGE OF UTERUS N/A 02/16/2020   Procedure: DILATATION AND CURETTAGE;  Surgeon: Carver Fila, MD;  Location: [facility];  Service: Gynecology;  Laterality: N/A;  NOT GENERAL -NO INTUBATION   DNC     INFUSION PUMP  IMPLANTATION  ~ 2009   baclofen infusion in lower abd   INTRATHECAL PUMP REVISION N/A 02/18/2021   Procedure: Baclofen Pump replacement with possible revision of catheter;  Surgeon: Maeola Harman, MD;  Location[name];  Service: Neurosurgery;  Laterality: N/A;  3C/RM 20   INTRAUTERINE DEVICE (IUD) INSERTION N/A 02/16/2020   Procedure: INTRAUTERINE DEVICE (IUD) INSERTION - MIRENA;  Surgeon: Carver Fila, MD;  Location: [facility];  Service: Gynecology;  Laterality: N/A;   IUD put in uterus     PAIN PUMP REVISION N/A 07/29/2014   Procedure: Baclofen pump replacement;  Surgeon: Maeola Harman, MD;  Location: [facility];  Service: Neurosurgery;  Laterality: N/A;  Baclofen pump replacement   TONSILLECTOMY AND ADENOIDECTOMY     VARICOSE VEIN SURGERY  ~ 1968   VENOUS ABLATION  12/16/2010   radiofreq ablation -Dr Lynnea Ferrier and [facility]   WISDOM TOOTH EXTRACTION      Current Outpatient Medications  Medication Sig Dispense Refill   acetaminophen (TYLENOL) 500 MG tablet Take 1,000 mg by mouth every 8 (eight) hours as needed for fever or headache (pain).     ALPRAZolam (XANAX) 0.5 MG  tablet Take 1 tablet (0.5 mg total) by mouth at bedtime. May also take 1 tablet every 12 hours as needed anxiety (Patient taking differently: Take 0.5 mg by mouth See admin instructions. Take 0.5 mg (1 tablet) by mouth at bedtime. May also take an additional 1 tablet every 12 hours as needed for anxiety.) 10 tablet 0   amiodarone (PACERONE) 200 MG tablet Take 1 tablet (200 mg total) by mouth 2 (two) times daily for 14 days, THEN 1 tablet (200 mg total) daily. Follow up with cardiology outpatient for refills and continued recommendations. (Patient taking differently: Take 2 tablets (400mg ) in the morning.)     amiodarone (PACERONE) 200 MG tablet Take 200 mg by mouth every evening.     baclofen (LIORESAL) 20 MG tablet Take 20 mg by mouth daily as needed for muscle spasms.     Biotin 16109 MCG TABS Take 1 tablet by mouth daily.      bisacodyl (DULCOLAX) 10 MG suppository Place 10 mg rectally See admin instructions. Insert 10 mg (1 suppository) rectally once daily on Sunday, Tuesday, Thursday. May use an additional suppository once daily as needed for constipation.     cetirizine (ZYRTEC) 10 MG tablet Take 10 mg by mouth daily as needed for allergies.     cholecalciferol (VITAMIN D3) 25 MCG (1000 UNIT) tablet Take 2,000 Units by mouth 2 (two) times daily.     clotrimazole (LOTRIMIN) 1 % cream Apply 1 Application topically 2 (two) times daily. To perineal area and abdominal folds     docusate sodium (COLACE) 100 MG capsule Take 100 mg by mouth 2 (two) times daily.     Emollient (AQUAPHOR ADV PROTECT HEALING) 41 % OINT Apply 1 application  topically See admin instructions. Apply topically every shift. Apply to redness in crease of buttocks every shift with and with each toileting times.     Emollient (CERAVE DAILY MOISTURIZING) LOTN Apply 1 application  topically daily. To groin, inguinal area     famotidine (PEPCID) 20 MG tablet Take 20 mg by mouth 2 (two) times daily.     fluticasone (FLONASE) 50 MCG/ACT nasal spray Place 1 spray into both nostrils daily as needed (for nasal congestion).     gabapentin (NEURONTIN) 300 MG capsule Mrs. Pickeral will take 300 mg of gabapentin at 6 AM, 10 AM, 2 PM and 6 PM and 600 mg at 10 PM by mouth. (Patient taking differently: Take 300 mg by mouth 4 (four) times daily.) 180 capsule 11   hydrocortisone (ANUSOL-HC) 25 MG suppository Place 1 suppository (25 mg total) rectally 2 (two) times daily. 14 suppository 4   levothyroxine (SYNTHROID) 75 MCG tablet Take 75 mcg by mouth daily before breakfast.     melatonin 5 MG TABS Take 5 mg by mouth at bedtime.     Menthol, Topical Analgesic, (BIOFREEZE COOL THE PAIN) 4 % GEL Apply 1 application  topically 3 (three) times daily. To left calf     metoprolol tartrate (LOPRESSOR) 50 MG tablet Take 0.5 tablets (25 mg total) by mouth daily. 30 tablet 0   midodrine  (PROAMATINE) 5 MG tablet Take 1 tablet (5 mg total) by mouth 3 (three) times daily with meals. (Patient taking differently: Take 5 mg by mouth every 8 (eight) hours as needed (SBP < 100).)     apixaban (ELIQUIS) 5 MG TABS tablet Take 5 mg by mouth 2 (two) times daily.     buPROPion (WELLBUTRIN XL) 150 MG 24 hr tablet Take 1 tablet (  150 mg total) by mouth daily.     corticotropin (ACTHAR) 80 UNIT/ML injectable gel Inject 1 mL (80 Units total) into the skin See admin instructions for 10 days. Inject 80 units as needed (about 5 times a month) 5 mL 5   diphenhydrAMINE (BENADRYL) 25 mg capsule Take 25 mg by mouth 3 (three) times daily as needed (minor itching, irritation).     lactulose (CHRONULAC) 10 GM/15ML solution Take 15 mLs (10 g total) by mouth 3 (three) times daily. 1350 mL 11   ondansetron (ZOFRAN-ODT) 4 MG disintegrating tablet Take 4 mg by mouth 4 (four) times daily as needed for nausea.     oxybutynin (DITROPAN) 5 MG tablet Take 5 mg by mouth 2 (two) times daily as needed (overactive bladder).     phenazopyridine (PYRIDIUM) 100 MG tablet Take 100 mg by mouth every 8 (eight) hours as needed (dysuria).     phenylephrine-shark liver oil-mineral oil-petrolatum (PREPARATION H) 0.25-14-74.9 % rectal ointment Place 1 Application rectally 3 (three) times daily as needed for hemorrhoids.     Polyethyl Glycol-Propyl Glycol (SYSTANE) 0.4-0.3 % SOLN Place 1 drop into both eyes in the morning and at bedtime.     polyethylene glycol (MIRALAX / GLYCOLAX) 17 g packet Take 17 g by mouth daily as needed for mild constipation, moderate constipation or severe constipation.     potassium chloride SA (KLOR-CON) 20 MEQ tablet Take 2 tablets (40 mEq total) by mouth daily. (Patient taking differently: Take 40 mEq by mouth in the morning.) 180 tablet 3   promethazine (PHENERGAN) 12.5 MG tablet Take 12.5 mg by mouth every 8 (eight) hours as needed for nausea or vomiting.     Prucalopride Succinate (MOTEGRITY) 2 MG TABS  Take 2 mg by mouth daily before breakfast.     Psyllium (METAMUCIL SMOOTH TEXTURE PO) Take 1 packet by mouth 2 (two) times daily.     Salicylic Acid-Cleanser 6 % CREAM KIT Apply 1 each topically See admin instructions. Apply topically once a day on Monday, Wednesday, Saturday to lesion on foot 3x weekly. Once soft, gently file with disposible nail file weekly, repeat until resolved.     senna (SENOKOT) 8.6 MG TABS tablet Take 17.2 mg by mouth 2 (two) times daily.     silver sulfADIAZINE (SILVADENE) 1 % cream Apply 1 Application topically 2 (two) times daily.     sodium phosphate (FLEET) 7-19 GM/118ML ENEM Place 1 enema rectally daily as needed (constipation).     torsemide (DEMADEX) 20 MG tablet Take 1.5 tablets (30 mg total) by mouth daily. Weigh yourself daily.  If you gain 2-3 lbs in a day or 5 lbs in a week or develop worsening swelling, take an extra dose of lasix.  You should follow up your dosing with your PCP/ cardiologist outpatient to determine if the dose needs further adjustment. (Patient taking differently: Take 30 mg by mouth daily. Take an extra dose with weight gain  of 2-3lbs in a day and 5lbs in a day or develop worsening swelling.)     torsemide (DEMADEX) 20 MG tablet Take 20 mg by mouth at bedtime.     triamcinolone cream (KENALOG) 0.1 % Apply 1 Application topically 2 (two) times daily.     Current Facility-Administered Medications  Medication Dose Route Frequency Provider Last Rate Last Admin   baclofen (GABLOFEN) intrathecal injection 40000 mcg/35ml  80,000 mcg Intrathecal Once Asa Lente, MD        Allergies as of 09/21/2023 - Review Complete  08/31/2023  Allergen Reaction Noted   Other Hives and Rash 01/03/2021   Hydrocodone Nausea And Vomiting 11/26/2011   Oxycontin [oxycodone] Nausea And Vomiting 05/12/2013   Zoloft [sertraline] Hives, Swelling, and Rash 01/29/2012   Chloraseptic [phenol] Hives 03/09/2021   Macrobid [nitrofurantoin] Nausea And Vomiting 09/17/2020    Vioxx [rofecoxib] Other (See Comments) 09/17/2020   Hibiclens [chlorhexidine gluconate] Hives, Rash, and Other (See Comments)     Family History  Problem Relation Age of Onset   Depression Mother    Sudden death Mother    Anxiety disorder Mother    Coronary artery disease Father        at age 58   Hyperlipidemia Father    Thyroid disease Father    Coronary artery disease Maternal Grandmother    Depression Maternal Grandmother    Cancer Paternal Grandmother        breast   Alcoholism Son    Sleep apnea Neg Hx    Multiple sclerosis Neg Hx     Social History   Socioeconomic History   Marital status: Married    Spouse name: [name]   Number of children: 2   Years of education: COLLEGE   Highest education level: Not on file  Occupational History   Occupation: RETIRED  Tobacco Use   Smoking status: Former    Current packs/day: 0.00    Average packs/day: 0.5 packs/day for 7.0 years (3.5 ttl pk-yrs)    Types: Cigarettes    Start date: 08/21/1969    Quit date: 08/21/1976    Years since quitting: 47.1   Smokeless tobacco: Never  Vaping Use   Vaping status: Never Used  Substance and Sexual Activity   Alcohol use: Not Currently   Drug use: No   Sexual activity: Not Currently    Birth control/protection: Post-menopausal  Other Topics Concern   Not on file  Social History Narrative   Lives in [place] with Husband Windy Fast [phone], [phone]   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   Right handed   Drinks a little caffeine in coffee sometimes    Social Drivers of Corporate investment banker Strain: Not on file  Food Insecurity: No Food Insecurity (08/13/2023)   Hunger Vital Sign    Worried About Running Out of Food in the Last Year: Never true    Ran Out of Food in the Last Year: Never true  Transportation Needs: No Transportation Needs (08/13/2023)   PRAPARE - Administrator, Civil Service (Medical): No    Lack of Transportation (Non-Medical): No  Physical Activity: Not on  file  Stress: Not on file  Social Connections: Not on file  Intimate Partner Violence: Not At Risk (08/13/2023)   Humiliation, Afraid, Rape, and Kick questionnaire    Fear of Current or Ex-Partner: No    Emotionally Abused: No    Physically Abused: No    Sexually Abused: No    Review of Systems:    Constitutional: No weight loss, fever, chills, weakness or fatigue HEENT: Eyes: No change in vision               Ears, Nose, Throat:  No change in hearing or congestion Skin: No rash or itching Cardiovascular: No chest pain, chest pressure or palpitations   Respiratory: No SOB or cough Gastrointestinal: See HPI and otherwise negative Genitourinary: No dysuria or change in urinary frequency Neurological: No headache, dizziness or syncope Musculoskeletal: No new muscle or joint pain Hematologic: No bleeding or bruising Psychiatric: No  history of depression or anxiety    Physical Exam:  Vital signs: BP 118/70   Pulse 71   SpO2 99%   Constitutional: NAD, Well developed, Well nourished, alert and cooperative. Wheelchair bound. Head:  Normocephalic and atraumatic. Eyes:   PEERL, EOMI. No icterus. Conjunctiva pink. Respiratory: Respirations even and unlabored. Lungs clear to auscultation bilaterally.   No wheezes, crackles, or rhonchi.  Cardiovascular:  Regular rate and rhythm. No peripheral edema, cyanosis or pallor.  Gastrointestinal:  Soft, nondistended, nontender. No rebound or guarding. Normal bowel sounds. No appreciable masses or hepatomegaly. Rectal:  Not performed.  Msk:  Symmetrical without gross deformities. Without edema, no deformity or joint abnormality.  Neurologic:  Alert and  oriented x4;  grossly normal neurologically.  Skin:   Dry and intact without significant lesions or rashes. Psychiatric: Oriented to person, place and time. Demonstrates good judgement and reason without abnormal affect or behaviors.  RELEVANT LABS AND IMAGING: CBC    Component Value Date/Time    WBC 6.3 08/31/2023 1430   RBC 4.29 08/31/2023 1430   HGB 13.5 08/31/2023 1430   HGB 14.1 03/08/2017 1152   HCT 40.9 08/31/2023 1430   HCT 43.0 03/08/2017 1152   PLT 266 08/31/2023 1430   PLT 219 12/18/2014 1157   MCV 95.3 08/31/2023 1430   MCV 93 03/08/2017 1152   MCH 31.5 08/31/2023 1430   MCHC 33.0 08/31/2023 1430   RDW 13.0 08/31/2023 1430   RDW 13.3 03/08/2017 1152   LYMPHSABS 3.1 08/20/2023 0313   LYMPHSABS 2.2 03/08/2017 1152   MONOABS 0.8 08/20/2023 0313   EOSABS 0.6 (H) 08/20/2023 0313   EOSABS 0.1 03/08/2017 1152   BASOSABS 0.1 08/20/2023 0313   BASOSABS 0.0 03/08/2017 1152    CMP     Component Value Date/Time   NA 138 08/31/2023 1430   NA 144 12/15/2020 1100   K 4.0 08/31/2023 1430   CL 94 (L) 08/31/2023 1430   CO2 33 (H) 08/31/2023 1430   GLUCOSE 85 08/31/2023 1430   BUN 18 08/31/2023 1430   BUN 27 12/15/2020 1100   CREATININE 0.67 08/31/2023 1430   CALCIUM 9.1 08/31/2023 1430   PROT 5.7 (L) 08/20/2023 0313   PROT 5.9 (L) 09/20/2020 1436   ALBUMIN 2.9 (L) 08/20/2023 0313   ALBUMIN 3.9 09/20/2020 1436   AST 13 (L) 08/20/2023 0313   ALT 9 08/20/2023 0313   ALKPHOS 54 08/20/2023 0313   BILITOT 0.6 08/20/2023 0313   BILITOT 0.3 09/20/2020 1436   GFRNONAA >60 08/31/2023 1430   GFRAA 90 09/20/2020 1436     Assessment/Plan:      Chronic Constipation  History of MS with neurogenic bowel. Currently on Metamucil, Motegrity, and lactulose twice daily. Reports bowel movements 2-3 times a day with occasional days of no bowel movements. Difficult situation with decreased ambulation. --Increase lactulose to three times daily. --Continue Metamucil and Motegrity as currently prescribed.  Gastroesophageal Reflux Disease (GERD) Currently managed with Protonix in the morning and famotidine at night. Reports no current issues with reflux. --Continue Protonix and famotidine as currently prescribed.  Hemorrhoids Reports occasional discomfort and use of  Preparation H externally. --Prescribe Preparation H suppositories for internal use.  Follow-up appointment scheduled for 3-6 months.      Assigned to Dr. Leonides Schanz today  Boone Master, PA-C [facility] 09/21/2023, 1:11 PM  Cc: Sherron Monday, MD

## 2023-09-21 NOTE — Patient Instructions (Signed)
We have printed prescriptions for hydrocortisone suppositories and lactulose for you to take to [facility].  Follow up with our office in 3-6 months.   _______________________________________________________  If your blood pressure at your visit was 140/90 or greater, please contact your primary care physician to follow up on this.  _______________________________________________________  If you are age 77 or older, your body mass index should be between 23-30. Your There is no height or weight on file to calculate BMI. If this is out of the aforementioned range listed, please consider follow up with your Primary Care Provider.  If you are age 63 or younger, your body mass index should be between 19-25. Your There is no height or weight on file to calculate BMI. If this is out of the aformentioned range listed, please consider follow up with your Primary Care Provider.   ________________________________________________________  The  GI providers would like to encourage you to use [facility] to communicate with providers for non-urgent requests or questions.  Due to long hold times on the telephone, sending your provider a message by [facility] may be a faster and more efficient way to get a response.  Please allow 48 business hours for a response.  Please remember that this is for non-urgent requests.  _______________________________________________________

## 2023-09-27 ENCOUNTER — Encounter: Payer: Self-pay | Admitting: Neurology

## 2023-10-02 ENCOUNTER — Telehealth: Payer: Self-pay | Admitting: Neurology

## 2023-10-02 ENCOUNTER — Telehealth: Payer: Self-pay | Admitting: *Deleted

## 2023-10-02 NOTE — Telephone Encounter (Signed)
Appointment details confirmed

## 2023-10-02 NOTE — Telephone Encounter (Signed)
Called pt and reminded her to bring cpap machine along with powercord to visit. Pt verbalized she understood.

## 2023-10-03 ENCOUNTER — Ambulatory Visit (INDEPENDENT_AMBULATORY_CARE_PROVIDER_SITE_OTHER): Payer: Medicare Other | Admitting: Neurology

## 2023-10-03 VITALS — BP 126/81 | HR 66 | Ht 62.0 in

## 2023-10-03 DIAGNOSIS — G4737 Central sleep apnea in conditions classified elsewhere: Secondary | ICD-10-CM | POA: Diagnosis not present

## 2023-10-03 DIAGNOSIS — G35 Multiple sclerosis: Secondary | ICD-10-CM

## 2023-10-03 DIAGNOSIS — G8250 Quadriplegia, unspecified: Secondary | ICD-10-CM

## 2023-10-03 DIAGNOSIS — I4821 Permanent atrial fibrillation: Secondary | ICD-10-CM

## 2023-10-03 DIAGNOSIS — I509 Heart failure, unspecified: Secondary | ICD-10-CM

## 2023-10-03 DIAGNOSIS — E240 Pituitary-dependent Cushing's disease: Secondary | ICD-10-CM

## 2023-10-03 DIAGNOSIS — J9601 Acute respiratory failure with hypoxia: Secondary | ICD-10-CM | POA: Diagnosis not present

## 2023-10-03 DIAGNOSIS — I4891 Unspecified atrial fibrillation: Secondary | ICD-10-CM

## 2023-10-03 DIAGNOSIS — Z7189 Other specified counseling: Secondary | ICD-10-CM | POA: Insufficient documentation

## 2023-10-03 MED ORDER — ACTHAR 80 UNIT/ML IJ GEL: 40.0000 [IU] | INTRAMUSCULAR | 5 refills | Status: DC

## 2023-10-03 NOTE — Progress Notes (Signed)
Provider:  Melvyn Novas, MD  Primary Care Physician:  Toni Monday, MD [address] [place] [address]     Referring Provider:    Sherron Monday, Md [address] [place],  [place] [address]          Chief Complaint according to patient   Patient presents with:                HISTORY OF PRESENT ILLNESS:  Toni [name] is a 78 y.o. female patient who is here for revisit 10/03/2023 for  concerns of ASV use.   MS, wheelchair bound, status post Covid since oxygen dependent. ASV dependent.  Had  been on ACTH, as she could not tolerate  steroids iv.   and has often UTIs, Now on catheter .   Tachycardia and  hypotension.  Afib, chronic    Chief concern according to patient :  I need acthar refills and not steroids.  Was in hospita;l over christmas, and got better , but had last week PT and her right leg couldn't be lifted. Is this a relapse? She has Dyseasthesias , feels as if she is wet , but isnt'.   UPDATE 07/30/2023 Dr.  Epimenio [name].  She feels she is doing a little worse.  She has had more spasm/pain sensation in the trunk/abdomen.  She was prescribed a high-dose prednisone dose but opted not to take the medication.  She notes that she had been on ACTHar in the past and felt better when she was on it.  She has tolerated that better than she did the prednisone.   The spasticity in the legs is doing about the same she has no painful spasms but will occasionally have some increased tone with movements.  She has spasticity that is well-controlled with the baclofen pump.  She requires refills every 2 to 3 months.   She had her pump replaced in 2022 (Dr. Venetia [name]).     She is in [facility] in [place].   Current pump is 40 cc.  She receives a benefit from the baclofen pump.  She is on third pump - first one 2009, last one July 2022).  She would like to increase the basal rate and I will increase it by 5%..  More recently we added a bolus at 10 PM..   She  does take p.o. baclofen as needed when spasticity is a little worse and she has taken nightly over the past few weeks.  She is written for the 20 mg pills and I discussed that if that makes her sleepy she can take half of the tablet several times a day.     She is unable to walk but can assist some with transfers.  She uses a standing frame.  She is able to move her legs in bed and to go on her side in bed.  She also notes weakness in her hands.   She has dysesthesias in her abdomen and legs.  Gabapentin is helping some..      She has difficulty with a neurogenic bladder and has frequent UTIs.  Spasticity is worse when she is sick.  Vision is doing well. She wasreently hospitalized for UTI (not sepsis).       09/12/21: CD  Toni [name] is a long term MS patient , now in the non active , secondary progressive stages of the disease, seen on 09-12-2021, revisit with her daughter present.  Toni [name] lives now at [facility],  her speech is  slurred, she is slower in speech and comprehension, she is wheelchair  bound.  Her BP runs low and she has been  in permanent atrial fib, long haul from Covid. She has dry eyes, special problem at night. Biotin gel at night, ASV user.  She has been hospitalized many times for UTI, she can't take cranberries- Vit K.  Has been seen by gyneco-urologist. No solution for her problem was found.  She has been started on oxygen and on ASV- had a new baclofen pump-  flushed tubing after being implanted , July 1.2022. Dr Toni [name] had reduced the baclofen dose and we will try to reduce further. Toni [name] follows up.         Toni [name] is a 78 year old female with a history of obstructive sleep apnea on BiPAP and multiple sclerosis. 03-07-2021.       10/11/2020 REFERRING M.D.:                 Toni Greathouse MD Study Performed:   Adaptive Servo Ventilation-Titration HISTORY:  We are discussing her high AHi on BiPAP, also she is very compliant with additional 3 liters of  oxygen, she will have to re-qualify for oxygen, I need her to come in for in lab study, but she is in need of a medical bed. Since she contracted COVID in a rehab facility she has lost weight, but has SOB more often, and she is not doing well with the PAP mask.  She feels she needs more ACTHAR shots.   She has had a baclofen pump and there was enough baclofen in the pump yet she remains in pain.  She has fluid retention- Dr Rennis Golden, diagnosed her CHF related anasarca. belly swelling and spasms in abdomen. She is on diuretics and potassium.  OT wants a new NCV. She had thenar atrophy- left hand. Dr Toni [name] stated this is Carpal tunnel- her hand surgeon wants documentation.  Complex sleep apnea on BiPAP: Download indicates compliance of 100%.   On average she uses it 8 hours and 50 minutes.  Her residual AHI is 21.5 on 16/11 centimeters of water.  She reports that she had an overnight pulse oximetry test with her pulmonologist that showed desaturations despite being on 3 L of oxygen at night.   The patient endorsed the Epworth Sleepiness Scale at 12-14 points.   The patient's weight 207 pounds with a height of 62 (inches), resulting in a BMI of 38.1 kg/m2. The patient's neck circumference measured 18 inches.    There was a total of 94 respiratory events: 40 obstructive apneas, 43 central apneas and 0 mixed apneas with a total of 83 apneas and an apnea index (AI) of 14.8 /hour. There were 11 hypopneas with a hypopnea index of 2./hour.    The total APNEA/HYPOPNEA INDEX  (AHI) was 16.8 /hour and the total RESPIRATORY DISTURBANCE INDEX was 16.8 /hour  1 events occurred in REM sleep and 93 events in NREM. The REM AHI was 1.2 /hour versus a non-REM AHI of 19.6 /hour.  The patient spent 336.5 minutes of total sleep time in the supine position and 0 minutes in non-supine. The supine AHI was 16.8, versus a non-supine AHI of 0.0.   OXYGEN SATURATION & C02:  The baseline 02 saturation was 89%, with the lowest being  71%. Time spent below 89% saturation equaled 64 minutes.   Best result was ASV at 13/12/0/4 cm water, 5 liters  of oxygen were needed and a FFM used. Marland Kitchen  PLANS/RECOMMENDATIONS: Best result was ASV at 13/12/0/4 cm water, 5 liters of oxygen were needed and a FFM used.        Review of Systems: Out of a complete 14 system review, the patient complains of only the following symptoms, and all other reviewed systems are negative.:    Social History   Socioeconomic History   Marital status: Married    Spouse name: [name]   Number of children: 2   Years of education: COLLEGE   Highest education level: Not on file  Occupational History   Occupation: RETIRED  Tobacco Use   Smoking status: Former    Current packs/day: 0.00    Average packs/day: 0.5 packs/day for 7.0 years (3.5 ttl pk-yrs)    Types: Cigarettes    Start date: 08/21/1969    Quit date: 08/21/1976    Years since quitting: 47.1   Smokeless tobacco: Never  Vaping Use   Vaping status: Never Used  Substance and Sexual Activity   Alcohol use: Not Currently   Drug use: No   Sexual activity: Not Currently    Birth control/protection: Post-menopausal  Other Topics Concern   Not on file  Social History Narrative   Lives in [place] with Husband Windy Fast [phone], [phone]   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   Right handed   Drinks a little caffeine in coffee sometimes    Social Drivers of Corporate investment banker Strain: Not on file  Food Insecurity: No Food Insecurity (08/13/2023)   Hunger Vital Sign    Worried About Running Out of Food in the Last Year: Never true    Ran Out of Food in the Last Year: Never true  Transportation Needs: No Transportation Needs (08/13/2023)   PRAPARE - Administrator, Civil Service (Medical): No    Lack of Transportation (Non-Medical): No  Physical Activity: Not on file  Stress: Not on file  Social Connections: Not on file    Family History  Problem Relation Age of Onset   Depression  Mother    Sudden death Mother    Anxiety disorder Mother    Coronary artery disease Father        at age 26   Hyperlipidemia Father    Thyroid disease Father    Coronary artery disease Maternal Grandmother    Depression Maternal Grandmother    Cancer Paternal Grandmother        breast   Alcoholism Son    Sleep apnea Neg Hx    Multiple sclerosis Neg Hx     Past Medical History:  Diagnosis Date   Abnormality of gait 03/01/2015   Acute metabolic encephalopathy 06/04/2020   Anxiety    Arthritis    "knees" (01/19/2016)   Asthma    as a child    Atrial fibrillation (HCC)    Back pain    Bilateral carpal tunnel syndrome 10/04/2020   Cancer (HCC)    / RENAL CELL CARCINOMA - GETS CT SCAN EVERY 6 MONTHS TO WATCH    CHF (congestive heart failure) (HCC)    Chronic pain    "nerve pain from the MS"   Complex atypical endometrial hyperplasia    Constipation    COVID-19 long hauler manifesting chronic dyspnea 09/13/2020   COVID-19 virus infection 08/23/2019   Depression    DVT (deep venous thrombosis) (HCC) 1983   "RLE; may have just been phlebitis; it was before the age of dopplers"  Dysrhythmia    afib    Edema    varicose veins with severe venous insuff in R and L GSV' ablation of R GSV 2012   GERD (gastroesophageal reflux disease)    HLD (hyperlipidemia)    Hypertension    Hypotension    Hypothyroidism    Joint pain    Multiple sclerosis (HCC)    has had this may 1989-[facility] reg doc   OSA on CPAP    bipap machine - SLEEP APNEA WORSE SINCE COVID IN 1/21 PER PATIENT SETTING HAS INCREASED FROM 9 TO 30    Osteoarthritis    PAF (paroxysmal atrial fibrillation) (HCC)    on coumadin; documented on monitor 06/2010 - no longer takes per Renne Crigler 02/09/21   Pneumonia 11/2011   Recurrent UTI 04/07/2020   Sepsis (HCC) 06/04/2020   Sepsis secondary to UTI (HCC) 11/20/2022   Sleep apnea    uses sleep apnea machine    Past Surgical History:  Procedure Laterality Date    ANTERIOR CERVICAL DECOMP/DISCECTOMY FUSION  01/2004   CARDIAC CATHETERIZATION  06/22/2010   normal coronary arteries, PAF   CATARACT EXTRACTION, BILATERAL Bilateral    CHILD BIRTHS     X2   DILATATION & CURETTAGE/HYSTEROSCOPY WITH MYOSURE N/A 01/15/2020   Procedure: DILATATION & CURETTAGE/HYSTEROSCOPY WITH MYOSURE;  Surgeon: Richardean Chimera, MD;  Location: [facility];  Service: Gynecology;  Laterality: N/A;  pt in wheelchair-has MS   DILATION AND CURETTAGE OF UTERUS     DILATION AND CURETTAGE OF UTERUS N/A 02/16/2020   Procedure: DILATATION AND CURETTAGE;  Surgeon: Carver Fila, MD;  Location: [facility];  Service: Gynecology;  Laterality: N/A;  NOT GENERAL -NO INTUBATION   DNC     INFUSION PUMP IMPLANTATION  ~ 2009   baclofen infusion in lower abd   INTRATHECAL PUMP REVISION N/A 02/18/2021   Procedure: Baclofen Pump replacement with possible revision of catheter;  Surgeon: Maeola Harman, MD;  Location: [facility] OR;  Service: Neurosurgery;  Laterality: N/A;  3C/RM 20   INTRAUTERINE DEVICE (IUD) INSERTION N/A 02/16/2020   Procedure: INTRAUTERINE DEVICE (IUD) INSERTION - MIRENA;  Surgeon: Carver Fila, MD;  Location: [facility];  Service: Gynecology;  Laterality: N/A;   IUD put in uterus     PAIN PUMP REVISION N/A 07/29/2014   Procedure: Baclofen pump replacement;  Surgeon: Maeola Harman, MD;  Location: [facility];  Service: Neurosurgery;  Laterality: N/A;  Baclofen pump replacement   TONSILLECTOMY AND ADENOIDECTOMY     VARICOSE VEIN SURGERY  ~ 1968   VENOUS ABLATION  12/16/2010   radiofreq ablation -Dr Lynnea Ferrier and [facility]   WISDOM TOOTH EXTRACTION       Current Outpatient Medications on File Prior to Visit  Medication Sig Dispense Refill   acetaminophen (TYLENOL) 500 MG tablet Take 1,000 mg by mouth every 8 (eight) hours as needed for fever or headache (pain).     ALPRAZolam (XANAX) 0.5 MG tablet Take 1 tablet (0.5 mg total) by mouth at bedtime. May also take 1 tablet every 12 hours as needed  anxiety (Patient taking differently: Take 0.5 mg by mouth See admin instructions. Take 0.5 mg (1 tablet) by mouth at bedtime. May also take an additional 1 tablet every 12 hours as needed for anxiety.) 10 tablet 0   amiodarone (PACERONE) 200 MG tablet Take 1 tablet (200 mg total) by mouth 2 (two) times daily for 14 days, THEN 1 tablet (200 mg total) daily. Follow up with cardiology outpatient for refills  and continued recommendations. (Patient taking differently: Take 2 tablets (400mg ) in the morning.)     amiodarone (PACERONE) 200 MG tablet Take 200 mg by mouth every evening.     apixaban (ELIQUIS) 5 MG TABS tablet Take 5 mg by mouth 2 (two) times daily.     baclofen (LIORESAL) 20 MG tablet Take 20 mg by mouth daily as needed for muscle spasms.     Biotin 40981 MCG TABS Take 1 tablet by mouth daily.     bisacodyl (DULCOLAX) 10 MG suppository Place 10 mg rectally See admin instructions. Insert 10 mg (1 suppository) rectally once daily on Sunday, Tuesday, Thursday. May use an additional suppository once daily as needed for constipation.     buPROPion (WELLBUTRIN XL) 150 MG 24 hr tablet Take 1 tablet (150 mg total) by mouth daily.     cetirizine (ZYRTEC) 10 MG tablet Take 10 mg by mouth daily as needed for allergies.     cholecalciferol (VITAMIN D3) 25 MCG (1000 UNIT) tablet Take 2,000 Units by mouth 2 (two) times daily.     clotrimazole (LOTRIMIN) 1 % cream Apply 1 Application topically 2 (two) times daily. To perineal area and abdominal folds     diphenhydrAMINE (BENADRYL) 25 mg capsule Take 25 mg by mouth 3 (three) times daily as needed (minor itching, irritation).     docusate sodium (COLACE) 100 MG capsule Take 100 mg by mouth 2 (two) times daily.     Emollient (AQUAPHOR ADV PROTECT HEALING) 41 % OINT Apply 1 application  topically See admin instructions. Apply topically every shift. Apply to redness in crease of buttocks every shift with and with each toileting times.     Emollient (CERAVE DAILY  MOISTURIZING) LOTN Apply 1 application  topically daily. To groin, inguinal area     famotidine (PEPCID) 20 MG tablet Take 20 mg by mouth 2 (two) times daily.     fluticasone (FLONASE) 50 MCG/ACT nasal spray Place 1 spray into both nostrils daily as needed (for nasal congestion).     gabapentin (NEURONTIN) 300 MG capsule Toni. Merced will take 300 mg of gabapentin at 6 AM, 10 AM, 2 PM and 6 PM and 600 mg at 10 PM by mouth. (Patient taking differently: Take 300 mg by mouth 4 (four) times daily.) 180 capsule 11   hydrocortisone (ANUSOL-HC) 25 MG suppository Place 1 suppository (25 mg total) rectally 2 (two) times daily. 14 suppository 4   lactulose (CHRONULAC) 10 GM/15ML solution Take 15 mLs (10 g total) by mouth 3 (three) times daily. 1350 mL 11   levothyroxine (SYNTHROID) 75 MCG tablet Take 75 mcg by mouth daily before breakfast.     melatonin 5 MG TABS Take 5 mg by mouth at bedtime.     Menthol, Topical Analgesic, (BIOFREEZE COOL THE PAIN) 4 % GEL Apply 1 application  topically 3 (three) times daily. To left calf     metoprolol tartrate (LOPRESSOR) 50 MG tablet Take 0.5 tablets (25 mg total) by mouth daily. 30 tablet 0   midodrine (PROAMATINE) 5 MG tablet Take 1 tablet (5 mg total) by mouth 3 (three) times daily with meals. (Patient taking differently: Take 5 mg by mouth every 8 (eight) hours as needed (SBP < 100).)     ondansetron (ZOFRAN-ODT) 4 MG disintegrating tablet Take 4 mg by mouth 4 (four) times daily as needed for nausea.     oxybutynin (DITROPAN) 5 MG tablet Take 5 mg by mouth 2 (two) times daily as needed (overactive bladder).  phenazopyridine (PYRIDIUM) 100 MG tablet Take 100 mg by mouth every 8 (eight) hours as needed (dysuria).     phenylephrine-shark liver oil-mineral oil-petrolatum (PREPARATION H) 0.25-14-74.9 % rectal ointment Place 1 Application rectally 3 (three) times daily as needed for hemorrhoids.     Polyethyl Glycol-Propyl Glycol (SYSTANE) 0.4-0.3 % SOLN Place 1 drop into  both eyes in the morning and at bedtime.     polyethylene glycol (MIRALAX / GLYCOLAX) 17 g packet Take 17 g by mouth daily as needed for mild constipation, moderate constipation or severe constipation.     potassium chloride SA (KLOR-CON) 20 MEQ tablet Take 2 tablets (40 mEq total) by mouth daily. (Patient taking differently: Take 40 mEq by mouth in the morning.) 180 tablet 3   promethazine (PHENERGAN) 12.5 MG tablet Take 12.5 mg by mouth every 8 (eight) hours as needed for nausea or vomiting.     Prucalopride Succinate (MOTEGRITY) 2 MG TABS Take 2 mg by mouth daily before breakfast.     Psyllium (METAMUCIL SMOOTH TEXTURE PO) Take 1 packet by mouth 2 (two) times daily.     Salicylic Acid-Cleanser 6 % CREAM KIT Apply 1 each topically See admin instructions. Apply topically once a day on [name], Wednesday, Saturday to lesion on [name] 3x weekly. Once soft, gently file with disposible nail file weekly, repeat until resolved.     senna (SENOKOT) 8.6 MG TABS tablet Take 17.2 mg by mouth 2 (two) times daily.     silver sulfADIAZINE (SILVADENE) 1 % cream Apply 1 Application topically 2 (two) times daily.     sodium phosphate (FLEET) 7-19 GM/118ML ENEM Place 1 enema rectally daily as needed (constipation).     torsemide (DEMADEX) 20 MG tablet Take 1.5 tablets (30 mg total) by mouth daily. Weigh yourself daily.  If you gain 2-3 lbs in a day or 5 lbs in a week or develop worsening swelling, take an extra dose of lasix.  You should follow up your dosing with your PCP/ cardiologist outpatient to determine if the dose needs further adjustment. (Patient taking differently: Take 30 mg by mouth daily. Take an extra dose with weight gain  of 2-3lbs in a day and 5lbs in a day or develop worsening swelling.)     torsemide (DEMADEX) 20 MG tablet Take 20 mg by mouth at bedtime.     triamcinolone cream (KENALOG) 0.1 % Apply 1 Application topically 2 (two) times daily.     corticotropin (ACTHAR) 80 UNIT/ML injectable gel Inject  1 mL (80 Units total) into the skin See admin instructions for 10 days. Inject 80 units as needed (about 5 times a month) 5 mL 5   Current Facility-Administered Medications on File Prior to Visit  Medication Dose Route Frequency Provider Last Rate Last Admin   baclofen (GABLOFEN) intrathecal injection 40000 mcg/18ml  80,000 mcg Intrathecal Once Sater, Pearletha Furl, MD        Allergies  Allergen Reactions   Other Hives and Rash    Topical Surgical prep  - severe rash, hives   Hydrocodone Nausea And Vomiting   Oxycontin [Oxycodone] Nausea And Vomiting   Zoloft [Sertraline] Hives, Swelling and Rash   Chloraseptic [Phenol] Hives   Macrobid [Nitrofurantoin] Nausea And Vomiting    Not documented on MAR   Vioxx [Rofecoxib] Other (See Comments)    Unknown reaction Can take NSAIDS   Hibiclens [Chlorhexidine Gluconate] Hives, Rash and Other (See Comments)    Sores     DIAGNOSTIC DATA (LABS, IMAGING, TESTING) -  I reviewed patient records, labs, notes, testing and imaging myself where available.  Lab Results  Component Value Date   WBC 6.3 08/31/2023   HGB 13.5 08/31/2023   HCT 40.9 08/31/2023   MCV 95.3 08/31/2023   PLT 266 08/31/2023      Component Value Date/Time   NA 138 08/31/2023 1430   NA 144 12/15/2020 1100   K 4.0 08/31/2023 1430   CL 94 (L) 08/31/2023 1430   CO2 33 (H) 08/31/2023 1430   GLUCOSE 85 08/31/2023 1430   BUN 18 08/31/2023 1430   BUN 27 12/15/2020 1100   CREATININE 0.67 08/31/2023 1430   CALCIUM 9.1 08/31/2023 1430   PROT 5.7 (L) 08/20/2023 0313   PROT 5.9 (L) 09/20/2020 1436   ALBUMIN 2.9 (L) 08/20/2023 0313   ALBUMIN 3.9 09/20/2020 1436   AST 13 (L) 08/20/2023 0313   ALT 9 08/20/2023 0313   ALKPHOS 54 08/20/2023 0313   BILITOT 0.6 08/20/2023 0313   BILITOT 0.3 09/20/2020 1436   GFRNONAA >60 08/31/2023 1430   GFRAA 90 09/20/2020 1436   Lab Results  Component Value Date   CHOL 111 03/19/2019   HDL 57 03/19/2019   LDLCALC 43 03/19/2019   TRIG 53  03/19/2019   CHOLHDL 3.0 04/22/2010   Lab Results  Component Value Date   HGBA1C 4.9 06/05/2020   No results found for: "VITAMINB12" Lab Results  Component Value Date   TSH 1.494 06/14/2023    PHYSICAL EXAM:  Today's Vitals   10/03/23 1442  BP: 126/81  Pulse: 66  Height: 5\' 2"  (1.575 m)   Body mass index is 37.42 kg/m.   Wt Readings from Last 3 Encounters:  08/31/23 204 lb 9.4 oz (92.8 kg)  08/30/23 204 lb 12.8 oz (92.9 kg)  08/15/23 207 lb 10.8 oz (94.2 kg)     Ht Readings from Last 3 Encounters:  10/03/23 5\' 2"  (1.575 m)  08/31/23 5\' 2"  (1.575 m)  08/30/23 5\' 2"  (1.575 m)      General: The patient is awake, alert and appears not in acute distress. The patient is well groomed. Head: Normocephalic, atraumatic. Neck is supple. Mallampati 3,  neck circumference:17.5"  inches .  Nasal airflow fully patent.  Retrognathia is  seen.  Cardiovascular:  irregular rate and cardiac rhythm by pulse,  without distended neck veins. Respiratory: Lungs are clear to auscultation.  Skin:  Without evidence of ankle edema, or rash. Trunk: The patient's posture is erect.   NEUROLOGIC EXAM: The patient is awake and alert, oriented to place and time.   Memory subjective described as intact.  Attention span & concentration ability appears normal.  Speech is fluent,  without  dysarthria, dysphonia or aphasia.  Mood and affect are appropriate.   Cranial nerves: no loss of smell or taste reported  Pupils are equal and briskly reactive to light.  Extraocular movements in vertical and horizontal planes were intact and without nystagmus. No Diplopia. Visual fields by finger perimetry are intact. Hearing was intact to soft voice and finger rubbing.    Facial sensation intact to fine touch.  Facial motor strength is symmetric and tongue and uvula move midline.  Neck ROM : rotation, tilt and flexion extension were normal for age and shoulder shrug was symmetrical.    Motor exam:   wheelchair bound.     ASSESSMENT AND PLAN 78 y.o. year old female  here with:   Chronic progressive MS , On ACTH.  Resides in a care facility.  Toni. Kinnick has been on Acthar for years until the medication was no longer formulary related provided last year.  She was on 80 units for 10 days a month I would like to reduce it to 40 units for 10 days a month, however we may have to go through a patient assistance program.  From her lab results she is not dehydrated according to most recent blood test she is better in terms of UTI frequency since she has a catheter and her skin has recovered.  At this time at least no symptomatic UTI.  She has reported 1 possible relapse and that is not acute change in her ability to flex the right hip and lift the right [name].  So your diagnosis may be chronic progressive MS but she still has occasional worsening.  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  The patient is on amiodarone as well.  Akhtar or ACTH was the best tolerated medication for maintenance in her case.  Plan I ordered a new ASV machine as hers is supposedly 77 years old in June or July 2025 her mask was here tried on and seems to fit but I think it is dislodging when she moves her head at night we may try a CPAP pillow for her.    1) ASV user with atrial fib history , high air leak.  [facility] -  she cannot hear if she takes a breath, and she feels there is not enough power behind it.  He residual AHI was 8/ h.     2), baclofen pump.  Refilled  next week by dr Toni [name]. [name]   3) acth repordered.      I plan to follow up  through our NP within 6 months.   I would like to thank  Toni Monday, Md [address] [place],  [place] [address] for allowing me to meet with and to take care of this pleasant patient.    After spending a total time of  39   minutes face to face and additional time for physical and neurologic examination, review of laboratory studies,  personal review of imaging studies, reports and results of other testing and review of referral information / records as far as provided in visit,   Electronically signed by: Toni Novas, MD 10/03/2023 3:17 PM  [facility] Board certified by The [employer] of Sleep Medicine and Diplomate of the [employer] of [employer] Medicine. Board certified In Neurology through the ABPN, Fellow of the [employer] of Neu[employer].

## 2023-10-03 NOTE — Progress Notes (Signed)
Toni [name]

## 2023-10-04 ENCOUNTER — Telehealth: Payer: Self-pay | Admitting: Neurology

## 2023-10-04 MED ORDER — ACTHAR 80 UNIT/ML IJ GEL: INTRAMUSCULAR | 5 refills | Status: DC

## 2023-10-04 NOTE — Telephone Encounter (Signed)
Pt called stating that the corticotropin (ACTHAR) 80 UNIT/ML injectable gel was sent to the wrong pharmacy, Pt stated that she needs it sent to [facility]. Please advise.

## 2023-10-04 NOTE — Telephone Encounter (Signed)
Reordered the med like prescribed and sent to specialty pharmacy.

## 2023-10-08 ENCOUNTER — Encounter: Payer: Self-pay | Admitting: Neurology

## 2023-10-08 ENCOUNTER — Ambulatory Visit (INDEPENDENT_AMBULATORY_CARE_PROVIDER_SITE_OTHER): Payer: Medicare Other | Admitting: Neurology

## 2023-10-08 VITALS — BP 112/61 | HR 74 | Ht 63.0 in | Wt 205.0 lb

## 2023-10-08 DIAGNOSIS — Z978 Presence of other specified devices: Secondary | ICD-10-CM | POA: Diagnosis not present

## 2023-10-08 DIAGNOSIS — G35 Multiple sclerosis: Secondary | ICD-10-CM

## 2023-10-08 DIAGNOSIS — G8250 Quadriplegia, unspecified: Secondary | ICD-10-CM

## 2023-10-08 NOTE — Progress Notes (Signed)
RM 1, with husband. Skin prepped with Betadine and draped in sterile fashion. Center port of implanted pump accessed using 22g huber needle.  Residual fluid measuring 11 ml was removed from the reservoir.  Gablofen 2016mcg/mlx2 instilled into pump. Pump reprogrammed to reflect a volume of 40ml.  ERI is 10/2027.    Next appt given for 12/17/23 at 3:00pm with Dr. Epimenio Foot.  Next alarm date: 12/28/2023.   Medication placed in pump: Gablofen 40,041mcg,20ml x2. Lot: [id number] Exp: 06/2025. NDC: [id number].

## 2023-10-08 NOTE — Progress Notes (Signed)
[facility]  PATIENT: Toni [name] DOB: 1945-10-14  REFERRING DOCTOR OR PCP: Dr. Vickey Huger SOURCE: Patient, notes from Dr. Vickey Huger and Dr. Anne Hahn  _________________________________   HISTORICAL  CHIEF COMPLAINT:  Chief Complaint  Patient presents with   Baclofen pump refill    RM 1    HISTORY OF PRESENT ILLNESS:  Toni [name] is a 78 year old woman with multiple sclerosis.  Marland Kitchen    UPDATE 10/08/2023 She reports that the spasticity is doing about the same though she is noticing more of the thoracic heart sensation.. She notes that she had been on ACTHar in the past and felt better when she was on it.  She has tolerated that better than she did the prednisone.  The spasticity is well-controlled with the baclofen pump.  She has had no painful spasms but does note some increased tone when she is moved.  She requires refills every 2 to 3 months.   She had her pump replaced in 2022 (Dr. Venetia Maxon).     She is in [facility] in [place].   Current pump is 40 cc.  She receives a benefit from the baclofen pump.  She is on third pump - first one 2009, last one July 2022).  Since her spasticity is stable we will keep the dose the same.  She occasionally takes an oral baclofen if she feels the spasticity is a little worse at night.  She is unable to walk but can assist some with transfers.  She uses a standing frame.  She is able to move her legs in bed and to go on her side in bed.  She also notes weakness in her hands.  She has dysesthesias in her abdomen and legs.  Gabapentin is helping some..     She has difficulty with a neurogenic bladder and has frequent UTIs.  Spasticity is worse when she is sick.  Vision is doing well. She wasreently hospitalized for UTI (not sepsis).     REVIEW OF SYSTEMS: Constitutional: No fevers, chills, sweats, or change in appetite Eyes: No visual changes, double vision, eye pain Ear, nose and throat: No hearing loss, ear pain,  nasal congestion, sore throat Cardiovascular: No chest pain, palpitations Respiratory:  No shortness of breath at rest or with exertion.   No wheezes GastrointestinaI: No nausea, vomiting, diarrhea, abdominal pain, fecal incontinence Genitourinary:  No dysuria, urinary retention or frequency.  No nocturia. Musculoskeletal:  No neck pain, back pain Integumentary: No rash, pruritus, skin lesions Neurological: as above Psychiatric: No depression at this time.  No anxiety Endocrine: No palpitations, diaphoresis, change in appetite, change in weigh or increased thirst Hematologic/Lymphatic:  No anemia, purpura, petechiae. Allergic/Immunologic: No itchy/runny eyes, nasal congestion, recent allergic reactions, rashes  ALLERGIES: Allergies  Allergen Reactions   Other Hives and Rash    Topical Surgical prep  - severe rash, hives   Hydrocodone Nausea And Vomiting   Oxycontin [Oxycodone] Nausea And Vomiting   Zoloft [Sertraline] Hives, Swelling and Rash   Chloraseptic [Phenol] Hives   Macrobid [Nitrofurantoin] Nausea And Vomiting    Not documented on MAR   Vioxx [Rofecoxib] Other (See Comments)    Unknown reaction Can take NSAIDS   Hibiclens [Chlorhexidine Gluconate] Hives, Rash and Other (See Comments)    Sores    HOME MEDICATIONS:  Current Outpatient Medications:    acetaminophen (TYLENOL) 500 MG tablet, Take 1,000 mg by mouth every 8 (eight) hours as needed for fever or headache (pain)., Disp: , Rfl:  ALPRAZolam (XANAX) 0.5 MG tablet, Take 1 tablet (0.5 mg total) by mouth at bedtime. May also take 1 tablet every 12 hours as needed anxiety (Patient taking differently: Take 0.5 mg by mouth See admin instructions. Take 0.5 mg (1 tablet) by mouth at bedtime. May also take an additional 1 tablet every 12 hours as needed for anxiety.), Disp: 10 tablet, Rfl: 0   amiodarone (PACERONE) 200 MG tablet, Take 200 mg by mouth every evening., Disp: , Rfl:    apixaban (ELIQUIS) 5 MG TABS tablet, Take  5 mg by mouth 2 (two) times daily., Disp: , Rfl:    baclofen (LIORESAL) 20 MG tablet, Take 20 mg by mouth daily as needed for muscle spasms., Disp: , Rfl:    Biotin 11914 MCG TABS, Take 1 tablet by mouth daily., Disp: , Rfl:    bisacodyl (DULCOLAX) 10 MG suppository, Place 10 mg rectally See admin instructions. Insert 10 mg (1 suppository) rectally once daily on Sunday, Tuesday, Thursday. May use an additional suppository once daily as needed for constipation., Disp: , Rfl:    buPROPion (WELLBUTRIN XL) 150 MG 24 hr tablet, Take 1 tablet (150 mg total) by mouth daily., Disp: , Rfl:    cetirizine (ZYRTEC) 10 MG tablet, Take 10 mg by mouth daily as needed for allergies., Disp: , Rfl:    cholecalciferol (VITAMIN D3) 25 MCG (1000 UNIT) tablet, Take 2,000 Units by mouth 2 (two) times daily., Disp: , Rfl:    clotrimazole (LOTRIMIN) 1 % cream, Apply 1 Application topically 2 (two) times daily. To perineal area and abdominal folds, Disp: , Rfl:    corticotropin (ACTHAR) 80 UNIT/ML injectable gel, Inject 80 mg every other day for 10 days and repeat monthy, Disp: 5 mL, Rfl: 5   diphenhydrAMINE (BENADRYL) 25 mg capsule, Take 25 mg by mouth 3 (three) times daily as needed (minor itching, irritation)., Disp: , Rfl:    docusate sodium (COLACE) 100 MG capsule, Take 100 mg by mouth 2 (two) times daily., Disp: , Rfl:    Emollient (AQUAPHOR ADV PROTECT HEALING) 41 % OINT, Apply 1 application  topically See admin instructions. Apply topically every shift. Apply to redness in crease of buttocks every shift with and with each toileting times., Disp: , Rfl:    Emollient (CERAVE DAILY MOISTURIZING) LOTN, Apply 1 application  topically daily. To groin, inguinal area, Disp: , Rfl:    famotidine (PEPCID) 20 MG tablet, Take 20 mg by mouth 2 (two) times daily., Disp: , Rfl:    fluticasone (FLONASE) 50 MCG/ACT nasal spray, Place 1 spray into both nostrils daily as needed (for nasal congestion)., Disp: , Rfl:    gabapentin  (NEURONTIN) 300 MG capsule, Mrs. Prout will take 300 mg of gabapentin at 6 AM, 10 AM, 2 PM and 6 PM and 600 mg at 10 PM by mouth. (Patient taking differently: Take 300 mg by mouth 4 (four) times daily.), Disp: 180 capsule, Rfl: 11   hydrocortisone (ANUSOL-HC) 25 MG suppository, Place 1 suppository (25 mg total) rectally 2 (two) times daily., Disp: 14 suppository, Rfl: 4   lactulose (CHRONULAC) 10 GM/15ML solution, Take 15 mLs (10 g total) by mouth 3 (three) times daily., Disp: 1350 mL, Rfl: 11   levothyroxine (SYNTHROID) 75 MCG tablet, Take 75 mcg by mouth daily before breakfast., Disp: , Rfl:    melatonin 5 MG TABS, Take 5 mg by mouth at bedtime., Disp: , Rfl:    Menthol, Topical Analgesic, (BIOFREEZE COOL THE PAIN) 4 % GEL, Apply  1 application  topically 3 (three) times daily. To left calf, Disp: , Rfl:    metoprolol tartrate (LOPRESSOR) 50 MG tablet, Take 0.5 tablets (25 mg total) by mouth daily., Disp: 30 tablet, Rfl: 0   midodrine (PROAMATINE) 5 MG tablet, Take 1 tablet (5 mg total) by mouth 3 (three) times daily with meals. (Patient taking differently: Take 5 mg by mouth every 8 (eight) hours as needed (SBP < 100).), Disp: , Rfl:    ondansetron (ZOFRAN-ODT) 4 MG disintegrating tablet, Take 4 mg by mouth 4 (four) times daily as needed for nausea., Disp: , Rfl:    oxybutynin (DITROPAN) 5 MG tablet, Take 5 mg by mouth 2 (two) times daily as needed (overactive bladder)., Disp: , Rfl:    phenazopyridine (PYRIDIUM) 100 MG tablet, Take 100 mg by mouth every 8 (eight) hours as needed (dysuria)., Disp: , Rfl:    phenylephrine-shark liver oil-mineral oil-petrolatum (PREPARATION H) 0.25-14-74.9 % rectal ointment, Place 1 Application rectally 3 (three) times daily as needed for hemorrhoids., Disp: , Rfl:    Polyethyl Glycol-Propyl Glycol (SYSTANE) 0.4-0.3 % SOLN, Place 1 drop into both eyes in the morning and at bedtime., Disp: , Rfl:    polyethylene glycol (MIRALAX / GLYCOLAX) 17 g packet, Take 17 g by mouth  daily as needed for mild constipation, moderate constipation or severe constipation., Disp: , Rfl:    potassium chloride SA (KLOR-CON) 20 MEQ tablet, Take 2 tablets (40 mEq total) by mouth daily. (Patient taking differently: Take 40 mEq by mouth in the morning.), Disp: 180 tablet, Rfl: 3   promethazine (PHENERGAN) 12.5 MG tablet, Take 12.5 mg by mouth every 8 (eight) hours as needed for nausea or vomiting., Disp: , Rfl:    Prucalopride Succinate (MOTEGRITY) 2 MG TABS, Take 2 mg by mouth daily before breakfast., Disp: , Rfl:    Psyllium (METAMUCIL SMOOTH TEXTURE PO), Take 1 packet by mouth 2 (two) times daily., Disp: , Rfl:    Salicylic Acid-Cleanser 6 % CREAM KIT, Apply 1 each topically See admin instructions. Apply topically once a day on Monday, Wednesday, Saturday to lesion on foot 3x weekly. Once soft, gently file with disposible nail file weekly, repeat until resolved., Disp: , Rfl:    senna (SENOKOT) 8.6 MG TABS tablet, Take 17.2 mg by mouth 2 (two) times daily., Disp: , Rfl:    silver sulfADIAZINE (SILVADENE) 1 % cream, Apply 1 Application topically 2 (two) times daily., Disp: , Rfl:    sodium phosphate (FLEET) 7-19 GM/118ML ENEM, Place 1 enema rectally daily as needed (constipation)., Disp: , Rfl:    torsemide (DEMADEX) 20 MG tablet, Take 1.5 tablets (30 mg total) by mouth daily. Weigh yourself daily.  If you gain 2-3 lbs in a day or 5 lbs in a week or develop worsening swelling, take an extra dose of lasix.  You should follow up your dosing with your PCP/ cardiologist outpatient to determine if the dose needs further adjustment. (Patient taking differently: Take 30 mg by mouth daily. Take an extra dose with weight gain  of 2-3lbs in a day and 5lbs in a day or develop worsening swelling.), Disp: , Rfl:    torsemide (DEMADEX) 20 MG tablet, Take 20 mg by mouth at bedtime., Disp: , Rfl:    triamcinolone cream (KENALOG) 0.1 %, Apply 1 Application topically 2 (two) times daily., Disp: , Rfl:     amiodarone (PACERONE) 200 MG tablet, Take 1 tablet (200 mg total) by mouth 2 (two) times daily for 14  days, THEN 1 tablet (200 mg total) daily. Follow up with cardiology outpatient for refills and continued recommendations. (Patient taking differently: Take 2 tablets (400mg ) in the morning.), Disp: , Rfl:   Current Facility-Administered Medications:    baclofen (GABLOFEN) intrathecal injection 40000 mcg/34ml, 80,000 mcg, Intrathecal, Once, Kaydan Wong, Pearletha Furl, MD  PAST MEDICAL HISTORY: Past Medical History:  Diagnosis Date   Abnormality of gait 03/01/2015   Acute metabolic encephalopathy 06/04/2020   Anxiety    Arthritis    "knees" (01/19/2016)   Asthma    as a child    Atrial fibrillation (HCC)    Back pain    Bilateral carpal tunnel syndrome 10/04/2020   Cancer (HCC)    / RENAL CELL CARCINOMA - GETS CT SCAN EVERY 6 MONTHS TO WATCH    CHF (congestive heart failure) (HCC)    Chronic pain    "nerve pain from the MS"   Complex atypical endometrial hyperplasia    Constipation    COVID-19 long hauler manifesting chronic dyspnea 09/13/2020   COVID-19 virus infection 08/23/2019   Depression    DVT (deep venous thrombosis) (HCC) 1983   "RLE; may have just been phlebitis; it was before the age of dopplers"   Dysrhythmia    afib    Edema    varicose veins with severe venous insuff in R and L GSV' ablation of R GSV 2012   GERD (gastroesophageal reflux disease)    HLD (hyperlipidemia)    Hypertension    Hypotension    Hypothyroidism    Joint pain    Multiple sclerosis (HCC)    has had this may 1989-[place] reg doc   OSA on CPAP    bipap machine - SLEEP APNEA WORSE SINCE COVID IN 1/21 PER PATIENT SETTING HAS INCREASED FROM 9 TO 30    Osteoarthritis    PAF (paroxysmal atrial fibrillation) (HCC)    on coumadin; documented on monitor 06/2010 - no longer takes per [facility] 02/09/21   Pneumonia 11/2011   Recurrent UTI 04/07/2020   Sepsis (HCC) 06/04/2020   Sepsis secondary to UTI  (HCC) 11/20/2022   Sleep apnea    uses sleep apnea machine    PAST SURGICAL HISTORY: Past Surgical History:  Procedure Laterality Date   ANTERIOR CERVICAL DECOMP/DISCECTOMY FUSION  01/2004   CARDIAC CATHETERIZATION  06/22/2010   normal coronary arteries, PAF   CATARACT EXTRACTION, BILATERAL Bilateral    CHILD BIRTHS     X2   DILATATION & CURETTAGE/HYSTEROSCOPY WITH MYOSURE N/A 01/15/2020   Procedure: DILATATION & CURETTAGE/HYSTEROSCOPY WITH MYOSURE;  Surgeon: Richardean Chimera, MD;  Location: [facility];  Service: Gynecology;  Laterality: N/A;  pt in wheelchair-has MS   DILATION AND CURETTAGE OF UTERUS     DILATION AND CURETTAGE OF UTERUS N/A 02/16/2020   Procedure: DILATATION AND CURETTAGE;  Surgeon: Carver Fila, MD;  Location: [facility];  Service: Gynecology;  Laterality: N/A;  NOT GENERAL -NO INTUBATION   DNC     INFUSION PUMP IMPLANTATION  ~ 2009   baclofen infusion in lower abd   INTRATHECAL PUMP REVISION N/A 02/18/2021   Procedure: Baclofen Pump replacement with possible revision of catheter;  Surgeon: Maeola Harman, MD;  Location: [facility] OR;  Service: Neurosurgery;  Laterality: N/A;  3C/RM 20   INTRAUTERINE DEVICE (IUD) INSERTION N/A 02/16/2020   Procedure: INTRAUTERINE DEVICE (IUD) INSERTION - MIRENA;  Surgeon: Carver Fila, MD;  Location: [facility];  Service: Gynecology;  Laterality: N/A;   IUD put in uterus  PAIN PUMP REVISION N/A 07/29/2014   Procedure: Baclofen pump replacement;  Surgeon: Maeola Harman, MD;  Location: [facility];  Service: Neurosurgery;  Laterality: N/A;  Baclofen pump replacement   TONSILLECTOMY AND ADENOIDECTOMY     VARICOSE VEIN SURGERY  ~ 1968   VENOUS ABLATION  12/16/2010   radiofreq ablation -Dr Lynnea Ferrier and [facility]   WISDOM TOOTH EXTRACTION      FAMILY HISTORY: Family History  Problem Relation Age of Onset   Depression Mother    Sudden death Mother    Anxiety disorder Mother    Coronary artery disease Father        at age 21    Hyperlipidemia Father    Thyroid disease Father    Coronary artery disease Maternal Grandmother    Depression Maternal Grandmother    Cancer Paternal Grandmother        breast   Alcoholism Son    Sleep apnea Neg Hx    Multiple sclerosis Neg Hx     SOCIAL HISTORY:  Social History   Socioeconomic History   Marital status: Married    Spouse name: [name]   Number of children: 2   Years of education: COLLEGE   Highest education level: Not on file  Occupational History   Occupation: RETIRED  Tobacco Use   Smoking status: Former    Current packs/day: 0.00    Average packs/day: 0.5 packs/day for 7.0 years (3.5 ttl pk-yrs)    Types: Cigarettes    Start date: 08/21/1969    Quit date: 08/21/1976    Years since quitting: 47.1   Smokeless tobacco: Never  Vaping Use   Vaping status: Never Used  Substance and Sexual Activity   Alcohol use: Not Currently   Drug use: No   Sexual activity: Not Currently    Birth control/protection: Post-menopausal  Other Topics Concern   Not on file  Social History Narrative   Lives in [place] with Husband Windy Fast [phone], [phone]   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   Right handed   Drinks a little caffeine in coffee sometimes    [place]    Pt [place]    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 Strain: Not on file  Food Insecurity: No Food Insecurity (08/13/2023)   Hunger Vital Sign    Worried About Running Out of Food in the Last Year: Never true    Ran Out of Food in the Last Year: Never true  Transportation Needs: No Transportation Needs (08/13/2023)   PRAPARE - Administrator, Civil Service (Medical): No    Lack of Transportation (Non-Medical): No  Physical Activity: Not on file  Stress: Not on file  Social Connections: Not on file  Intimate Partner Violence: Not At Risk (08/13/2023)   Humiliation, Afraid, Rape, and Kick questionnaire    Fear of Current or Ex-Partner: No    Emotionally Abused: No    Physically  Abused: No    Sexually Abused: No     PHYSICAL EXAM  Vitals:   10/08/23 1447  BP: 112/61  Pulse: 74  Weight: 205 lb (93 kg)  Height: 5\' 3"  (1.6 m)       Body mass index is 36.31 kg/m.   General: The patient is well-developed and well-nourished and in no acute distress  HEENT:  Head is [place]/AT.    Neurologic Exam  Mental status: The patient is alert and oriented x 3 at the time of the examination. The patient has apparent  normal recent and remote memory, with an apparently normal attention span and concentration ability.   .  Cranial nerves: Extraocular movements are full.  Facial strength was normal  Motor:  Muscle bulk reduced at the thenar eminences.  Muscle tone was minimally increased in the legs.  SHe has reduced grip bilaterally and 3/5 strength in the intrinsic hand muscles.  Strength is 2+/5 in the left leg and 2/5 in the right leg.  Sensory: Sensory testing is intact to touch x4.  Gait and station: She is wheelchair-bound..   Reflexes: Deep tendon reflexes are symmetric in the arms and absent in the legs.  No ankle clonus.    DIAGNOSTIC DATA (LABS, IMAGING, TESTING) - I reviewed patient records, labs, notes, testing and imaging myself where available.  Lab Results  Component Value Date   WBC 6.3 08/31/2023   HGB 13.5 08/31/2023   HCT 40.9 08/31/2023   MCV 95.3 08/31/2023   PLT 266 08/31/2023      Component Value Date/Time   NA 138 08/31/2023 1430   NA 144 12/15/2020 1100   K 4.0 08/31/2023 1430   CL 94 (L) 08/31/2023 1430   CO2 33 (H) 08/31/2023 1430   GLUCOSE 85 08/31/2023 1430   BUN 18 08/31/2023 1430   BUN 27 12/15/2020 1100   CREATININE 0.67 08/31/2023 1430   CALCIUM 9.1 08/31/2023 1430   PROT 5.7 (L) 08/20/2023 0313   PROT 5.9 (L) 09/20/2020 1436   ALBUMIN 2.9 (L) 08/20/2023 0313   ALBUMIN 3.9 09/20/2020 1436   AST 13 (L) 08/20/2023 0313   ALT 9 08/20/2023 0313   ALKPHOS 54 08/20/2023 0313   BILITOT 0.6 08/20/2023 0313   BILITOT 0.3  09/20/2020 1436   GFRNONAA >60 08/31/2023 1430   GFRAA 90 09/20/2020 1436   Lab Results  Component Value Date   CHOL 111 03/19/2019   HDL 57 03/19/2019   LDLCALC 43 03/19/2019   TRIG 53 03/19/2019   CHOLHDL 3.0 04/22/2010   Lab Results  Component Value Date   HGBA1C 4.9 06/05/2020   No results found for: "VITAMINB12" Lab Results  Component Value Date   TSH 1.494 06/14/2023       ASSESSMENT AND PLAN  Multiple sclerosis, 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  The baclofen pump was refilled with baclofen at 2000 mg per ml.   We will keep the rate and the bolus the same.. If the truncal dysesthesias worsen consider adding lamotrigine and titrate to 50 mg twice daily.   She will return to see [name] in about 3 months for the next pump refill.  She should call us or Dr. Vickey Huger sooner for new or worsening symptoms..   F/u with Dr. Vickey Huger for other MS / neuro issues   Cloud Graham A. Epimenio Foot, MD, [facility] 10/08/2023, 8:22 PM Certified in Neurology, Clinical Neurophysiology, Sleep Medicine and Neuroimaging  [facility] [address], [address] [place], [place] [address] [phone]

## 2023-10-09 ENCOUNTER — Ambulatory Visit: Payer: Medicare Other | Admitting: Adult Health

## 2023-10-29 ENCOUNTER — Other Ambulatory Visit: Payer: Self-pay | Admitting: Obstetrics and Gynecology

## 2023-10-29 DIAGNOSIS — Z1231 Encounter for screening mammogram for malignant neoplasm of breast: Secondary | ICD-10-CM

## 2023-11-08 ENCOUNTER — Ambulatory Visit

## 2023-11-09 ENCOUNTER — Telehealth: Payer: Self-pay | Admitting: *Deleted

## 2023-11-09 ENCOUNTER — Emergency Department (HOSPITAL_COMMUNITY)

## 2023-11-09 ENCOUNTER — Emergency Department (HOSPITAL_COMMUNITY)
Admission: EM | Admit: 2023-11-09 | Discharge: 2023-11-10 | Disposition: A | Discharge status: Home / Self Care | Attending: Emergency Medicine | Admitting: Emergency Medicine

## 2023-11-09 DIAGNOSIS — J45909 Unspecified asthma, uncomplicated: Secondary | ICD-10-CM | POA: Insufficient documentation

## 2023-11-09 DIAGNOSIS — E039 Hypothyroidism, unspecified: Secondary | ICD-10-CM | POA: Insufficient documentation

## 2023-11-09 DIAGNOSIS — E785 Hyperlipidemia, unspecified: Secondary | ICD-10-CM | POA: Insufficient documentation

## 2023-11-09 DIAGNOSIS — Z79899 Other long term (current) drug therapy: Secondary | ICD-10-CM | POA: Diagnosis not present

## 2023-11-09 DIAGNOSIS — M48061 Spinal stenosis, lumbar region without neurogenic claudication: Secondary | ICD-10-CM | POA: Diagnosis not present

## 2023-11-09 DIAGNOSIS — I509 Heart failure, unspecified: Secondary | ICD-10-CM | POA: Diagnosis not present

## 2023-11-09 DIAGNOSIS — G4733 Obstructive sleep apnea (adult) (pediatric): Secondary | ICD-10-CM | POA: Insufficient documentation

## 2023-11-09 DIAGNOSIS — I110 Hypertensive heart disease with heart failure: Secondary | ICD-10-CM | POA: Diagnosis not present

## 2023-11-09 DIAGNOSIS — Z79890 Hormone replacement therapy (postmenopausal): Secondary | ICD-10-CM | POA: Insufficient documentation

## 2023-11-09 DIAGNOSIS — I4891 Unspecified atrial fibrillation: Secondary | ICD-10-CM | POA: Insufficient documentation

## 2023-11-09 DIAGNOSIS — I700 Atherosclerosis of aorta: Secondary | ICD-10-CM | POA: Insufficient documentation

## 2023-11-09 DIAGNOSIS — K5900 Constipation, unspecified: Secondary | ICD-10-CM | POA: Diagnosis not present

## 2023-11-09 DIAGNOSIS — K5909 Other constipation: Secondary | ICD-10-CM

## 2023-11-09 DIAGNOSIS — N2889 Other specified disorders of kidney and ureter: Secondary | ICD-10-CM | POA: Diagnosis not present

## 2023-11-09 DIAGNOSIS — M8589 Other specified disorders of bone density and structure, multiple sites: Secondary | ICD-10-CM | POA: Diagnosis not present

## 2023-11-09 DIAGNOSIS — N3090 Cystitis, unspecified without hematuria: Secondary | ICD-10-CM

## 2023-11-09 DIAGNOSIS — Z7901 Long term (current) use of anticoagulants: Secondary | ICD-10-CM | POA: Diagnosis not present

## 2023-11-09 DIAGNOSIS — R109 Unspecified abdominal pain: Secondary | ICD-10-CM | POA: Diagnosis present

## 2023-11-09 LAB — COMPREHENSIVE METABOLIC PANEL
ALT: 47 U/L — ABNORMAL HIGH (ref 0–44)
AST: 27 U/L (ref 15–41)
Albumin: 3.8 g/dL (ref 3.5–5.0)
Alkaline Phosphatase: 57 U/L (ref 38–126)
Anion gap: 11 (ref 5–15)
BUN: 10 mg/dL (ref 8–23)
CO2: 30 mmol/L (ref 22–32)
Calcium: 8.6 mg/dL — ABNORMAL LOW (ref 8.9–10.3)
Chloride: 93 mmol/L — ABNORMAL LOW (ref 98–111)
Creatinine, Ser: 0.6 mg/dL (ref 0.44–1.00)
GFR, Estimated: >60 mL/min (ref >60)
Glucose, Bld: 84 mg/dL (ref 70–99)
Potassium: 3.2 mmol/L — ABNORMAL LOW (ref 3.5–5.1)
Sodium: 134 mmol/L — ABNORMAL LOW (ref 135–145)
Total Bilirubin: 0.8 mg/dL (ref 0.0–1.2)
Total Protein: 6.8 g/dL (ref 6.5–8.1)

## 2023-11-09 LAB — URINALYSIS, ROUTINE W REFLEX MICROSCOPIC
Bilirubin Urine: NEGATIVE
Glucose, UA: NEGATIVE mg/dL
Ketones, ur: NEGATIVE mg/dL
Nitrite: POSITIVE — AB
Protein, ur: 30 mg/dL — AB
Specific Gravity, Urine: 1.004 — ABNORMAL LOW (ref 1.005–1.030)
WBC, UA: >50 WBC/hpf (ref 0–5)
pH: 7 (ref 5.0–8.0)

## 2023-11-09 LAB — CBC WITH DIFFERENTIAL/PLATELET
Abs Immature Granulocytes: 0.01 10*3/uL (ref 0.00–0.07)
Basophils Absolute: 0 10*3/uL (ref 0.0–0.1)
Basophils Relative: 0 %
Eosinophils Absolute: 0.3 10*3/uL (ref 0.0–0.5)
Eosinophils Relative: 4 %
HCT: 43.2 % (ref 36.0–46.0)
Hemoglobin: 14.3 g/dL (ref 12.0–15.0)
Immature Granulocytes: 0 %
Lymphocytes Relative: 37 %
Lymphs Abs: 2.5 10*3/uL (ref 0.7–4.0)
MCH: 31.1 pg (ref 26.0–34.0)
MCHC: 33.1 g/dL (ref 30.0–36.0)
MCV: 93.9 fL (ref 80.0–100.0)
Monocytes Absolute: 0.6 10*3/uL (ref 0.1–1.0)
Monocytes Relative: 9 %
Neutro Abs: 3.3 10*3/uL (ref 1.7–7.7)
Neutrophils Relative %: 50 %
Platelets: 219 10*3/uL (ref 150–400)
RBC: 4.6 MIL/uL (ref 3.87–5.11)
RDW: 12.6 % (ref 11.5–15.5)
WBC: 6.7 10*3/uL (ref 4.0–10.5)
nRBC: 0 % (ref 0.0–0.2)

## 2023-11-09 LAB — LIPASE, BLOOD: Lipase: 27 U/L (ref 11–51)

## 2023-11-09 MED ORDER — GABAPENTIN 300 MG PO CAPS
300.0000 mg | ORAL_CAPSULE | Freq: Once | ORAL | Status: AC
  Administered 2023-11-09: 300 mg via ORAL
  Filled 2023-11-09: qty 1

## 2023-11-09 MED ORDER — MAGNESIUM HYDROXIDE 400 MG/5ML PO SUSP
15.0000 mL | Freq: Once | ORAL | Status: AC
  Administered 2023-11-09: 15 mL via ORAL
  Filled 2023-11-09: qty 30

## 2023-11-09 MED ORDER — ONDANSETRON HCL 4 MG/2ML IJ SOLN
4.0000 mg | Freq: Once | INTRAMUSCULAR | Status: AC
  Administered 2023-11-09: 4 mg via INTRAVENOUS
  Filled 2023-11-09: qty 2

## 2023-11-09 MED ORDER — AMIODARONE HCL 200 MG PO TABS
200.0000 mg | ORAL_TABLET | Freq: Every day | ORAL | Status: DC
Start: 2023-11-10 — End: 2023-11-10
  Filled 2023-11-09: qty 1

## 2023-11-09 MED ORDER — SODIUM CHLORIDE 0.9 % IV SOLN
1.0000 g | Freq: Once | INTRAVENOUS | Status: DC
  Filled 2023-11-09: qty 10

## 2023-11-09 MED ORDER — POTASSIUM CHLORIDE CRYS ER 20 MEQ PO TBCR
40.0000 meq | EXTENDED_RELEASE_TABLET | Freq: Once | ORAL | Status: AC
  Administered 2023-11-10: 40 meq via ORAL
  Filled 2023-11-09: qty 2

## 2023-11-09 MED ORDER — MIDODRINE HCL 5 MG PO TABS
5.0000 mg | ORAL_TABLET | Freq: Once | ORAL | Status: AC
  Administered 2023-11-09: 5 mg via ORAL
  Filled 2023-11-09: qty 1

## 2023-11-09 MED ORDER — IOHEXOL 300 MG/ML  SOLN
100.0000 mL | Freq: Once | INTRAMUSCULAR | Status: AC | PRN
  Administered 2023-11-09: 100 mL via INTRAVENOUS

## 2023-11-09 MED ORDER — SODIUM CHLORIDE 0.9 % IV BOLUS
1000.0000 mL | Freq: Once | INTRAVENOUS | Status: AC
Start: 2023-11-09 — End: 2023-11-09
  Administered 2023-11-09: 1000 mL via INTRAVENOUS

## 2023-11-09 MED ORDER — SMOG ENEMA
960.0000 mL | Freq: Once | RECTAL | Status: AC
  Administered 2023-11-10: 960 mL via RECTAL
  Filled 2023-11-09: qty 960

## 2023-11-09 NOTE — ED Provider Notes (Signed)
 [place] [facility] AT [facility] Provider Note   CSN: [id number] Arrival date & time: 11/09/23  1655     History  Chief Complaint  Patient presents with   Abdominal Pain    ASHTAN LATON is a 78 y.o. female.  Patient with history of A-fib on Eliquis, MS, CHF, hypertension, hyperlipidemia, OSA on CPAP, recurrent UTIs with chronic indwelling Foley catheter presents today from her nursing facility with concern for constipation.  Patient has been having difficulty having regular bowel movements for some time, however today she started having some abdominal pain.  She has been nauseous as well with a few episodes of vomiting.  Her facility did an x-ray that was concerning for an ileus and she was subsequently sent here for evaluation.  She states that they have given her a fleets enema and laxatives with minimal success, however she states that her facility hesitates to be aggressive with management because when they give her substantial amounts of medicine she always has very large bowel movements that ended in liquid stools and eventually gets dehydrated and requires admission for profound hyponatremia. Facility was hoping that she could be admitted to have golytely cleanout while receiving IV fluids to prevent dehydration and hyponatremia.  She has had bowel movements regularly still, however they are small.  Of note, patient is having good output from her catheter.  She reportedly has colonized bacteria at her catheter site and therefore they have deemed that she should not be treated for UTI unless she has fevers or elevated white blood cell count on labs. Her catheter was just changed last week and she notes that they put a large amount of fluid in the balloon because if they dont then she will have leakage from around the catheter site.    The history is provided by the patient. No language interpreter was used.  Abdominal Pain Associated symptoms: constipation,  nausea and vomiting (none currently)        Home Medications Prior to Admission medications   Medication Sig Start Date End Date Taking? Authorizing Provider  acetaminophen (TYLENOL) 500 MG tablet Take 1,000 mg by mouth every 8 (eight) hours as needed for fever or headache (pain).    [provider]  ALPRAZolam Prudy Feeler) 0.5 MG tablet Take 1 tablet (0.5 mg total) by mouth at bedtime. May also take 1 tablet every 12 hours as needed anxiety Patient taking differently: Take 0.5 mg by mouth See admin instructions. Take 0.5 mg (1 tablet) by mouth at bedtime. May also take an additional 1 tablet every 12 hours as needed for anxiety. 07/17/22   Ghimire, Werner Lean, MD  amiodarone (PACERONE) 200 MG tablet Take 1 tablet (200 mg total) by mouth 2 (two) times daily for 14 days, THEN 1 tablet (200 mg total) daily. Follow up with cardiology outpatient for refills and continued recommendations. Patient taking differently: Take 2 tablets (400mg ) in the morning. 08/20/23 10/03/23  Zigmund Daniel., MD  amiodarone (PACERONE) 200 MG tablet Take 200 mg by mouth every evening.    [provider]  apixaban (ELIQUIS) 5 MG TABS tablet Take 5 mg by mouth 2 (two) times daily.    [provider]  baclofen (LIORESAL) 20 MG tablet Take 20 mg by mouth daily as needed for muscle spasms.    [provider]  Biotin 40981 MCG TABS Take 1 tablet by mouth daily.    [provider]  bisacodyl (DULCOLAX) 10 MG suppository Place 10 mg  rectally See admin instructions. Insert 10 mg (1 suppository) rectally once daily on Sunday, Tuesday, Thursday. May use an additional suppository once daily as needed for constipation.    [provider]  buPROPion (WELLBUTRIN XL) 150 MG 24 hr tablet Take 1 tablet (150 mg total) by mouth daily. 08/20/23   Zigmund Daniel., MD  cetirizine (ZYRTEC) 10 MG tablet Take 10 mg by mouth daily as needed for allergies.    [provider]   cholecalciferol (VITAMIN D3) 25 MCG (1000 UNIT) tablet Take 2,000 Units by mouth 2 (two) times daily.    [provider]  clotrimazole (LOTRIMIN) 1 % cream Apply 1 Application topically 2 (two) times daily. To perineal area and abdominal folds    [provider]  corticotropin (ACTHAR) 80 UNIT/ML injectable gel Inject 80 mg every other day for 10 days and repeat monthy 10/04/23   Dohmeier, Porfirio Mylar, MD  diphenhydrAMINE (BENADRYL) 25 mg capsule Take 25 mg by mouth 3 (three) times daily as needed (minor itching, irritation).    [provider]  docusate sodium (COLACE) 100 MG capsule Take 100 mg by mouth 2 (two) times daily.    [provider]  Emollient (AQUAPHOR ADV PROTECT HEALING) 41 % OINT Apply 1 application  topically See admin instructions. Apply topically every shift. Apply to redness in crease of buttocks every shift with and with each toileting times.    [provider]  Emollient (CERAVE DAILY MOISTURIZING) LOTN Apply 1 application  topically daily. To groin, inguinal area    [provider]  famotidine (PEPCID) 20 MG tablet Take 20 mg by mouth 2 (two) times daily.    [provider]  fluticasone (FLONASE) 50 MCG/ACT nasal spray Place 1 spray into both nostrils daily as needed (for nasal congestion).    [provider]  gabapentin (NEURONTIN) 300 MG capsule Mrs. Kortz will take 300 mg of gabapentin at 6 AM, 10 AM, 2 PM and 6 PM and 600 mg at 10 PM by mouth. Patient taking differently: Take 300 mg by mouth 4 (four) times daily. 03/07/21   Dohmeier, Porfirio Mylar, MD  hydrocortisone (ANUSOL-HC) 25 MG suppository Place 1 suppository (25 mg total) rectally 2 (two) times daily. 09/21/23   McMichael, Saddie Benders, PA-C  levothyroxine (SYNTHROID) 75 MCG tablet Take 75 mcg by mouth daily before breakfast.    [provider]  melatonin 5 MG TABS Take 5 mg by mouth at bedtime.    [provider]  Menthol, Topical Analgesic,  (BIOFREEZE COOL THE PAIN) 4 % GEL Apply 1 application  topically 3 (three) times daily. To left calf    [provider]  metoprolol tartrate (LOPRESSOR) 50 MG tablet Take 0.5 tablets (25 mg total) by mouth daily. 08/31/23   Linwood Dibbles, MD  midodrine (PROAMATINE) 5 MG tablet Take 1 tablet (5 mg total) by mouth 3 (three) times daily with meals. Patient taking differently: Take 5 mg by mouth every 8 (eight) hours as needed (SBP < 100). 08/20/23   Zigmund Daniel., MD  ondansetron (ZOFRAN-ODT) 4 MG disintegrating tablet Take 4 mg by mouth 4 (four) times daily as needed for nausea.    [provider]  oxybutynin (DITROPAN) 5 MG tablet Take 5 mg by mouth 2 (two) times daily as needed (overactive bladder).    [provider]  phenazopyridine (PYRIDIUM) 100 MG tablet Take 100 mg by mouth every 8 (eight) hours as needed (dysuria).    [provider]  phenylephrine-shark liver oil-mineral oil-petrolatum (PREPARATION H) 0.25-14-74.9 % rectal ointment Place 1 Application rectally 3 (three) times daily as needed for hemorrhoids.    [provider]  Polyethyl Glycol-Propyl Glycol (SYSTANE) 0.4-0.3 % SOLN Place 1 drop into both eyes in the morning and at bedtime.    [provider]  polyethylene glycol (MIRALAX / GLYCOLAX) 17 g packet Take 17 g by mouth daily as needed for mild constipation, moderate constipation or severe constipation.    [provider]  potassium chloride SA (KLOR-CON) 20 MEQ tablet Take 2 tablets (40 mEq total) by mouth daily. Patient taking differently: Take 40 mEq by mouth in the morning. 01/03/21   Hilty, Lisette Abu, MD  promethazine (PHENERGAN) 12.5 MG tablet Take 12.5 mg by mouth every 8 (eight) hours as needed for nausea or vomiting.    [provider]  Prucalopride Succinate (MOTEGRITY) 2 MG TABS Take 2 mg by mouth daily before breakfast.    [provider]  Psyllium (METAMUCIL SMOOTH TEXTURE PO) Take 1  packet by mouth 2 (two) times daily.    [provider]  Salicylic Acid-Cleanser 6 % CREAM KIT Apply 1 each topically See admin instructions. Apply topically once a day on Monday, Wednesday, Saturday to lesion on foot 3x weekly. Once soft, gently file with disposible nail file weekly, repeat until resolved.    [provider]  senna (SENOKOT) 8.6 MG TABS tablet Take 17.2 mg by mouth 2 (two) times daily.    [provider]  silver sulfADIAZINE (SILVADENE) 1 % cream Apply 1 Application topically 2 (two) times daily. 08/23/23   [provider]  sodium phosphate (FLEET) 7-19 GM/118ML ENEM Place 1 enema rectally daily as needed (constipation).    [provider]  torsemide (DEMADEX) 20 MG tablet Take 1.5 tablets (30 mg total) by mouth daily. Weigh yourself daily.  If you gain 2-3 lbs in a day or 5 lbs in a week or develop worsening swelling, take an extra dose of lasix.  You should follow up your dosing with your PCP/ cardiologist outpatient to determine if the dose needs further adjustment. Patient taking differently: Take 30 mg by mouth daily. Take an extra dose with weight gain  of 2-3lbs in a day and 5lbs in a day or develop worsening swelling. 08/20/23   Zigmund Daniel., MD  torsemide (DEMADEX) 20 MG tablet Take 20 mg by mouth at bedtime.    [provider]  triamcinolone cream (KENALOG) 0.1 % Apply 1 Application topically 2 (two) times daily.    [provider]      Allergies    Other, Hydrocodone, Oxycontin [oxycodone], Zoloft [sertraline], Chloraseptic [phenol], Macrobid [nitrofurantoin], Vioxx [rofecoxib], and Hibiclens [chlorhexidine gluconate]    Review of Systems   Review of Systems  Gastrointestinal:  Positive for abdominal pain, constipation, nausea and vomiting (none currently).  All other systems reviewed and are negative.   Physical Exam Updated Vital Signs BP 103/62 (BP Location: Right Arm)   Pulse 77   Temp 98  F (36.7 C) (Oral)   Resp 18   SpO2 98%  Physical Exam Vitals and nursing note reviewed.  Constitutional:      General: She is not in acute distress.    Appearance: Normal appearance. She is normal weight. She is not ill-appearing, toxic-appearing or diaphoretic.  HENT:     Head: Normocephalic and atraumatic.  Cardiovascular:     Rate and Rhythm: Normal rate.  Pulmonary:     Effort:  Pulmonary effort is normal. No respiratory distress.  Abdominal:     General: Abdomen is flat.     Palpations: Abdomen is soft.     Tenderness: There is generalized abdominal tenderness.     Comments: Good flow from the indwelling foley catheter  Musculoskeletal:        General: Normal range of motion.     Cervical back: Normal range of motion.  Skin:    General: Skin is warm and dry.  Neurological:     General: No focal deficit present.     Mental Status: She is alert.  Psychiatric:        Mood and Affect: Mood normal.        Behavior: Behavior normal.     ED Results / Procedures / Treatments   Labs (all labs ordered are listed, but only abnormal results are displayed) Labs Reviewed  COMPREHENSIVE METABOLIC PANEL - Abnormal; Notable for the following components:      Result Value   Sodium 134 (*)    Potassium 3.2 (*)    Chloride 93 (*)    Calcium 8.6 (*)    ALT 47 (*)    All other components within normal limits  URINALYSIS, ROUTINE W REFLEX MICROSCOPIC - Abnormal; Notable for the following components:   APPearance HAZY (*)    Specific Gravity, Urine 1.004 (*)    Hgb urine dipstick SMALL (*)    Protein, ur 30 (*)    Nitrite POSITIVE (*)    Leukocytes,Ua LARGE (*)    Bacteria, UA MANY (*)    All other components within normal limits  URINE CULTURE  LIPASE, BLOOD  CBC WITH DIFFERENTIAL/PLATELET    EKG None  Radiology CT ABDOMEN PELVIS W CONTRAST Result Date: 11/09/2023 CLINICAL DATA:  Left-sided abdominal pain EXAM: CT ABDOMEN AND PELVIS WITH CONTRAST TECHNIQUE:  Multidetector CT imaging of the abdomen and pelvis was performed using the standard protocol following bolus administration of intravenous contrast. RADIATION DOSE REDUCTION: This exam was performed according to the departmental dose-optimization program which includes automated exposure control, adjustment of the mA and/or kV according to patient size and/or use of iterative reconstruction technique. CONTRAST:  OMNIPAQUE IOHEXOL 300 MG/ML  SOLN COMPARISON:  CT 08/13/2023, 07/13/2022, 02/25/2019 FINDINGS: Lower chest: Lung bases demonstrate no acute airspace disease. Mild cardiomegaly. Trace pericardial effusion Hepatobiliary: No focal liver abnormality is seen. No gallstones, gallbladder wall thickening, or biliary dilatation. Pancreas: Unremarkable. No pancreatic ductal dilatation or surrounding inflammatory changes. Spleen: Normal in size without focal abnormality. Adrenals/Urinary Tract: Adrenal glands are normal. Cortical scarring within the upper pole of the left kidney. Small exophytic enhancing renal mass measuring 15 mm off the upper pole left kidney, stable since 2020. Thick-walled urinary bladder with perivesical stranding and mild mucosal enhancement. Catheter in the bladder, the tip of the catheter appears to be within the bladder wall with small focus of gas at the superficial surface of the bladder, series 9 image 96 and spine, coronal series 7 image 83 and 84. Stomach/Bowel: Stomach nonenlarged. No dilated small bowel. Large stool burden. No acute bowel wall thickening. Negative appendix. Vascular/Lymphatic: Aortic atherosclerosis. No enlarged abdominal or pelvic lymph nodes. Reproductive: IUD in the uterus.  No adnexal mass. Other: No free fluid or free air Musculoskeletal: No acute or suspicious osseous abnormality. Multilevel degenerative change. Ascending thoracic stimulator leads incompletely visualized. IMPRESSION: 1. Thick-walled urinary bladder with perivesical stranding and mild  mucosal enhancement, findings suggestive of cystitis. Catheter in the bladder which is decompressed,  the tip of the catheter appears to be within the bladder wall and may superficially perforate the superior wall of the bladder. 2. Large stool burden suggests constipation. 3. 15 mm enhancing renal mass/possible small renal cell carcinoma off the upper pole of the left kidney, stable since 2020. 4. Aortic atherosclerosis. Electronically Signed   By: Jasmine Pang M.D.   On: 11/09/2023 20:42    Procedures Procedures    Medications Ordered in ED Medications  sorbitol, magnesium hydroxide, mineral oil, glycerin (SMOG) enema (has no administration in time range)  amiodarone (PACERONE) tablet 200 mg (has no administration in time range)  gabapentin (NEURONTIN) capsule 300 mg (has no administration in time range)  midodrine (PROAMATINE) tablet 5 mg (has no administration in time range)  ondansetron (ZOFRAN) injection 4 mg (4 mg Intravenous Given 11/09/23 1936)  iohexol (OMNIPAQUE) 300 MG/ML solution 100 mL (100 mLs Intravenous Contrast Given 11/09/23 2000)  sodium chloride 0.9 % bolus 1,000 mL (1,000 mLs Intravenous Bolus 11/09/23 2209)  magnesium hydroxide (MILK OF MAGNESIA) suspension 15 mL (15 mLs Oral Given 11/09/23 2209)    ED Course/ Medical Decision Making/ A&P                                 Medical Decision Making Amount and/or Complexity of Data Reviewed Labs: ordered. Radiology: ordered.  Risk OTC drugs. Prescription drug management.   This patient is a 78 y.o. female who presents to the ED for concern of abdominal pain, nausea, vomiting, this involves an extensive number of treatment options, and is a complaint that carries with it a high risk of complications and morbidity. The emergent differential diagnosis prior to evaluation includes, but is not limited to,  constipation, AAA, gastroenteritis, appendicitis, Bowel obstruction, Bowel perforation. Gastroparesis, DKA, Hernia,  Inflammatory bowel disease, mesenteric ischemia, pancreatitis, peritonitis SBP, volvulus.   This is not an exhaustive differential.   Past Medical History / Co-morbidities / Social History:  has a past medical history of Abnormality of gait (03/01/2015), Acute metabolic encephalopathy (06/04/2020), Anxiety, Arthritis, Asthma, Atrial fibrillation (HCC), Back pain, Bilateral carpal tunnel syndrome (10/04/2020), Cancer (HCC), CHF (congestive heart failure) (HCC), Chronic pain, Complex atypical endometrial hyperplasia, Constipation, COVID-19 long hauler manifesting chronic dyspnea (09/13/2020), COVID-19 virus infection (08/23/2019), Depression, DVT (deep venous thrombosis) (HCC) (1983), Dysrhythmia, Edema, GERD (gastroesophageal reflux disease), HLD (hyperlipidemia), Hypertension, Hypotension, Hypothyroidism, Joint pain, Multiple sclerosis (HCC), OSA on CPAP, Osteoarthritis, PAF (paroxysmal atrial fibrillation) (HCC), Pneumonia (11/2011), Recurrent UTI (04/07/2020), Sepsis (HCC) (06/04/2020), Sepsis secondary to UTI (HCC) (11/20/2022), and Sleep apnea.  Additional history: Chart reviewed.  Physical Exam: Physical exam performed. The pertinent findings include: generalized abdominal TTP, no rebound or guarding. Good flow of nonbloody urine from catheter  Lab Tests: I ordered, and personally interpreted labs.  The pertinent results include:  no leukocytosis, Na 134, K 3.2. UA nitrite positive, however likely colonized due to chronic indwelling foley. Culture ordered. No leukocytosis or fevers, will defer treatment.   Imaging Studies: I ordered imaging studies including CT abdomen pelvis, CT cystogram. I independently visualized and interpreted imaging which showed   1. Thick-walled urinary bladder with perivesical stranding and mild mucosal enhancement, findings suggestive of cystitis. Catheter in the bladder which is decompressed, the tip of the catheter appears to be within the bladder wall and may  superficially perforate the superior wall of the bladder. 2. Large stool burden suggests constipation. 3. 15 mm enhancing renal mass/possible small renal cell  carcinoma off the upper pole of the left kidney, stable since 2020. 4. Aortic atherosclerosis.  I agree with the radiologist interpretation.  CT cystogram pending at shift change.   Medications: I ordered medication including amiodarone, gabapentin, midodrine for patient's evening meds that she has missed tonight.  Milk of magnesia and smog enema for constipation.  Fluids for impending dehydration given administration of laxatives.  Oral potassium for hypokalemia. Reevaluation of the patient after these medicines pending at shift change  Consultations Obtained: I requested consultation with the urology on call Dr. Liliane Shi,  and discussed lab and imaging findings as well as pertinent plan - they recommend: CT cystogram to evaluate for perforation. If negative, can be discharged.   Disposition:  Patient's cystogram and laxative regimen is pending at shift change and will determine dispo. Hopefully she will have good output with both Milk of Magnesia and a smog enema.  If successful with these regimens and negative CT cystogram, patient can likely be discharged with outpatient follow-up and return precautions.  Care handoff to Sharilyn Sites, PA-C at shift change.  Please see their note for continued evaluation and dispo.  I discussed this case with my attending physician Dr. Theresia Lo who cosigned this note including patient's presenting symptoms, physical exam, and planned diagnostics and interventions. Attending physician stated agreement with plan or made changes to plan which were implemented.    Final Clinical Impression(s) / ED Diagnoses Final diagnoses:  None    Rx / DC Orders ED Discharge Orders     None         Silva Bandy, PA-C 11/09/23 2245    Elayne Snare K, DO 11/09/23 2336

## 2023-11-09 NOTE — ED Triage Notes (Signed)
 Pt sent from [facility]. Pt complaining of left sided abdominal pain x 2 weeks. Pt sent for work up by her primary provider.

## 2023-11-09 NOTE — Telephone Encounter (Signed)
 Spoke with Toni [name] this morning. Pt states she is no longer having vaginal bleeding/spotting. Pt states she had an appointment with Dr. Arelia Sneddon on 11/06/23 but had to cancel it because she had a stomach bug. Pt states she is rescheduled to follow up with Dr. Arelia Sneddon on 5/20 at 2 pm. Pt thanked the office for calling to check in on her and has no concerns or questions at this time.

## 2023-11-09 NOTE — ED Notes (Signed)
 Unable to get IV access. Several attempts made. Order for IV team put in.

## 2023-11-09 NOTE — Telephone Encounter (Signed)
 Could you check in with her at some point today? Is she having any vaginal bleeding? Was she able to get in to see Dr. Arelia Sneddon?

## 2023-11-10 ENCOUNTER — Emergency Department (HOSPITAL_COMMUNITY)

## 2023-11-10 DIAGNOSIS — K5900 Constipation, unspecified: Secondary | ICD-10-CM | POA: Diagnosis not present

## 2023-11-10 MED ORDER — SODIUM CHLORIDE 0.9 % IV SOLN
1.0000 g | Freq: Once | INTRAVENOUS | Status: AC
  Administered 2023-11-10: 1 g via INTRAVENOUS
  Filled 2023-11-10: qty 10

## 2023-11-10 MED ORDER — APIXABAN 5 MG PO TABS
5.0000 mg | ORAL_TABLET | Freq: Once | ORAL | Status: AC
  Administered 2023-11-10: 5 mg via ORAL
  Filled 2023-11-10: qty 1

## 2023-11-10 MED ORDER — CEPHALEXIN 500 MG PO CAPS
500.0000 mg | ORAL_CAPSULE | Freq: Two times a day (BID) | ORAL | 0 refills | Status: DC
Start: 2023-11-10 — End: 2024-04-08

## 2023-11-10 MED ORDER — IOHEXOL 300 MG/ML  SOLN
50.0000 mL | Freq: Once | INTRAMUSCULAR | Status: AC | PRN
  Administered 2023-11-10: 50 mL

## 2023-11-10 NOTE — ED Notes (Signed)
Contacted [facility] for transport 

## 2023-11-10 NOTE — ED Notes (Addendum)
 Called to give report to [facility]. RN on phone expressed concern that Pt had not had a bowel movement yet, and stated that was the whole reason pt had been transported to the hospital in the first place, over concern of her constipation. Rn stated that Np wanted the Pt to have bowel cleared over concerns of results of KUB that had been performed at facility. Writer spoke with MD, who stated that Pt had been consulted to surgery, and bowel regimen had been written for the Pt. MD spoke to RN at [employer] and explained condition of Pt to her.

## 2023-11-10 NOTE — ED Notes (Signed)
 Patient has asked to speak to the doctor   she said she is being discharged with the same thing she came in with    message was sent to doctor [facility]

## 2023-11-10 NOTE — ED Notes (Signed)
 Patient spoke to charge and now has decided to be transported back to her facility

## 2023-11-10 NOTE — ED Notes (Signed)
Patient taken off the bedpan.

## 2023-11-10 NOTE — ED Provider Notes (Signed)
 Patient had been seen earlier this AM in ED with constipation/abd cramping, had ED workup and was awaiting transport back to facility.  I was notified by RN that daughter would like call. I contacted daughter (pt had given permission to discuss her case with daughter), discussed pt, and reviewed chart.   Daughter expressed concern re ongoing constipation issues. I went over CT report and labs. CT without bowel obstruction (note made of multiple CTs going back to 2020 noting significant constipation). Pt w no vomiting during course of stay in ED, current vitals normal. No fevers.  GI note from 1/31, visit for constipation reviewed and discussed.   I went back to room, pt had already left w transport.   I discussed with daughter that I could call patients GI doctor/service on-call to discuss patient, see if any additional recommendations, and facilitate close f/u with them. I also offered that she could have facility MD/APP contact me in 30 minutes or so (after I had chance to speak to GI), to discuss plan and/or any additional recommendations. I also offered that if patient's symptoms should worsen (including worsening pain, vomiting, fevers, etc), to have facility return patient for recheck.  I discussed pt with [facility] on call - she reviewed patient info, indicates she will leave message with patients MD/PA that symptoms continue/worsen, and to facilitate close follow up and or make additional recs to facility/pcp. She did indicate could try a colonoscopy type bowel prep/miralax. They will f/u with closely.      Cathren Laine, MD 11/10/23 1014

## 2023-11-10 NOTE — Discharge Instructions (Addendum)
 It was our pleasure to provide your ER care today - we hope that you feel better.  Drink plenty of fluids/stay well hydrated. Get adequate fiber in diet. You may also try  prunes/prune juice.  You may take colace (stool softener) 2x/day, and miralax (laxative) once per day as need - these medications are available over the counter.   Follow up with primary care doctor in the coming week. Also follow up closely with urology.   Return to ER if worse, new symptoms, fevers, new or worsening or severe abdominal pain, persistent vomiting, or other concern.

## 2023-11-10 NOTE — ED Notes (Signed)
 [facility] has arrived     patient has refused transport because she hasn't been treated for what she came in with

## 2023-11-10 NOTE — ED Provider Notes (Signed)
 Assumed care at shift change.  See prior note for full H&P.  Briefly, 78 year old female with history of MS here with abdominal pain.  Found to have a lot of stool burden.  CT with questionable bladder perforation.  She does have chronic indwelling Foley which continues draining normally.  Case discussed with urology by prior team, recommended CT cystogram and if this is normal she can be discharged.  Plan:  awaiting CT cystogram.  Results for orders placed or performed during the hospital encounter of 11/09/23  Comprehensive metabolic panel   Collection Time: 11/09/23  6:57 PM  Result Value Ref Range   Sodium 134 (L) 135 - 145 mmol/L   Potassium 3.2 (L) 3.5 - 5.1 mmol/L   Chloride 93 (L) 98 - 111 mmol/L   CO2 30 22 - 32 mmol/L   Glucose, Bld 84 70 - 99 mg/dL   BUN 10 8 - 23 mg/dL   Creatinine, Ser 1.61 0.44 - 1.00 mg/dL   Calcium 8.6 (L) 8.9 - 10.3 mg/dL   Total Protein 6.8 6.5 - 8.1 g/dL   Albumin 3.8 3.5 - 5.0 g/dL   AST 27 15 - 41 U/L   ALT 47 (H) 0 - 44 U/L   Alkaline Phosphatase 57 38 - 126 U/L   Total Bilirubin 0.8 0.0 - 1.2 mg/dL   GFR, Estimated >09 >60 mL/min   Anion gap 11 5 - 15  Lipase, blood   Collection Time: 11/09/23  6:57 PM  Result Value Ref Range   Lipase 27 11 - 51 U/L  CBC with Diff   Collection Time: 11/09/23  6:57 PM  Result Value Ref Range   WBC 6.7 4.0 - 10.5 K/uL   RBC 4.60 3.87 - 5.11 MIL/uL   Hemoglobin 14.3 12.0 - 15.0 g/dL   HCT 45.4 09.8 - 11.9 %   MCV 93.9 80.0 - 100.0 fL   MCH 31.1 26.0 - 34.0 pg   MCHC 33.1 30.0 - 36.0 g/dL   RDW 14.7 82.9 - 56.2 %   Platelets 219 150 - 400 K/uL   nRBC 0.0 0.0 - 0.2 %   Neutrophils Relative % 50 %   Neutro Abs 3.3 1.7 - 7.7 K/uL   Lymphocytes Relative 37 %   Lymphs Abs 2.5 0.7 - 4.0 K/uL   Monocytes Relative 9 %   Monocytes Absolute 0.6 0.1 - 1.0 K/uL   Eosinophils Relative 4 %   Eosinophils Absolute 0.3 0.0 - 0.5 K/uL   Basophils Relative 0 %   Basophils Absolute 0.0 0.0 - 0.1 K/uL   Immature  Granulocytes 0 %   Abs Immature Granulocytes 0.01 0.00 - 0.07 K/uL  Urinalysis, Routine w reflex microscopic -Urine, Clean Catch   Collection Time: 11/09/23  7:04 PM  Result Value Ref Range   Color, Urine YELLOW YELLOW   APPearance HAZY (A) CLEAR   Specific Gravity, Urine 1.004 (L) 1.005 - 1.030   pH 7.0 5.0 - 8.0   Glucose, UA NEGATIVE NEGATIVE mg/dL   Hgb urine dipstick SMALL (A) NEGATIVE   Bilirubin Urine NEGATIVE NEGATIVE   Ketones, ur NEGATIVE NEGATIVE mg/dL   Protein, ur 30 (A) NEGATIVE mg/dL   Nitrite POSITIVE (A) NEGATIVE   Leukocytes,Ua LARGE (A) NEGATIVE   RBC / HPF 11-20 0 - 5 RBC/hpf   WBC, UA >50 0 - 5 WBC/hpf   Bacteria, UA MANY (A) NONE SEEN   Squamous Epithelial / HPF 0-5 0 - 5 /HPF   WBC Clumps PRESENT    *  Note: Due to a large number of results and/or encounters for the requested time period, some results have not been displayed. A complete set of results can be found in Results Review.   CT CYSTOGRAM ABD/PELVIS Result Date: 11/10/2023 CLINICAL DATA:  CT findings consistent with cystitis on CT scan with IV contrast yesterday. Foley catheter with tenting of the dome of the bladder on that study and question s raised of a superficial wall perforation of the superior bladder wall. EXAM: CT CYSTOGRAM (CT ABDOMEN AND PELVIS WITH CONTRAST) TECHNIQUE: Multi-detector CT imaging through the abdomen and pelvis was performed after dilute contrast had been introduced into the bladder for the purposes of performing CT cystography. RADIATION DOSE REDUCTION: This exam was performed according to the departmental dose-optimization program which includes automated exposure control, adjustment of the mA and/or kV according to patient size and/or use of iterative reconstruction technique. CONTRAST:  50mL OMNIPAQUE IOHEXOL 300 MG/ML  SOLN COMPARISON:  CT with IV contrast yesterday at 7:59 p.m., CT with IV contrast 08/13/2023. FINDINGS: Lower chest: No acute abnormality. Hepatobiliary: No focal  liver abnormality is seen. No gallstones, gallbladder wall thickening, or biliary dilatation. There is contrast in the patient's gallbladder consistent vicarious excretion. Pancreas: No abnormality. Spleen: No abnormality. Adrenals/Urinary Tract: No adrenal mass. Cortical scarring and a small Bosniak 1 cyst in the left kidney are again noted. 1.5 cm exophytic mostly solid mass superior pole of this kidney is unchanged since 2020. This is consistent with a small renal cell carcinoma. The No other renal mass is seen. There is contrast in collecting systems, ureters and bladder. The technologist instilled dilute contrast into the bladder via IV bag, but the patient could only tolerate 100 mL of intravesical contrast. Bladder filling is therefore suboptimal. The patient's Foley catheter has been pulled back. The balloon is still in the bladder but the part of the catheter distal to the balloon is no longer imbedded in the wall. Still seen is a wedge-shaped impression into the thickened bladder wall superiorly where the catheter previously impressed into the wall. Contrast fills this wedge-shaped impression and extends to the serosal surface. There is no contrast extravasation outside of the bladder into the peritoneal cavity. There is a tiny amount of contrast in the inferior aspect of the vagina which probably is refluxed although a vesicovaginal fistula is not strictly excluded. Perivesical stranding appears similar consistent with cystitis. Stomach/Bowel: No new findings. Constipation and diverticulosis. No bowel obstruction inflammation. Vascular/Lymphatic: Aortic atherosclerosis. No enlarged abdominal or pelvic lymph nodes. Reproductive: IUD.  No adnexal mass. Other: Trace perceptible ascites, unchanged. No free hemorrhage or free air. No abscess. Musculoskeletal: Neurostimulator implanted on the the right and wiring extending into the spinal canal at T12-L1. Advanced facet hypertrophy L4-5 is again noted with  grade 1 spondylolisthesis and severe acquired spinal stenosis at this level. There is left greater than right hip DJD, osteopenia and bridging thoracic spine syndesmophytes. IMPRESSION: 1. The Foley catheter has been pulled back. The balloon is still in the bladder but the part of the catheter distal to the balloon is no longer imbedded in the wall. 2. There is a wedge-shaped impression into the thickened bladder wall superiorly where the catheter previously impressed into the wall. Contrast fills this wedge-shaped impression and extends to the serosal surface, but there is no contrast extravasation outside of the bladder into the peritoneal cavity, at least with the 100 mL contrast volume the patient was able to tolerate. 3. Tiny amount of contrast in the inferior  aspect of the vagina which probably is refluxed although a vesicovaginal fistula is not strictly excluded. 4. Perivesical stranding consistent with cystitis. 5. Constipation and diverticulosis. 6. Aortic atherosclerosis. 7. 1.5 cm exophytic mostly solid mass superior pole of the left kidney unchanged since 2020, consistent with a small renal cell carcinoma. 8. Osteopenia, hip DJD and bridging thoracic spine syndesmophytes. 9. Severe acquired spinal stenosis L4-5. Electronically Signed   By: Almira Bar M.D.   On: 11/10/2023 04:51   CT ABDOMEN PELVIS W CONTRAST Result Date: 11/09/2023 CLINICAL DATA:  Left-sided abdominal pain EXAM: CT ABDOMEN AND PELVIS WITH CONTRAST TECHNIQUE: Multidetector CT imaging of the abdomen and pelvis was performed using the standard protocol following bolus administration of intravenous contrast. RADIATION DOSE REDUCTION: This exam was performed according to the departmental dose-optimization program which includes automated exposure control, adjustment of the mA and/or kV according to patient size and/or use of iterative reconstruction technique. CONTRAST:  OMNIPAQUE IOHEXOL 300 MG/ML  SOLN COMPARISON:  CT  08/13/2023, 07/13/2022, 02/25/2019 FINDINGS: Lower chest: Lung bases demonstrate no acute airspace disease. Mild cardiomegaly. Trace pericardial effusion Hepatobiliary: No focal liver abnormality is seen. No gallstones, gallbladder wall thickening, or biliary dilatation. Pancreas: Unremarkable. No pancreatic ductal dilatation or surrounding inflammatory changes. Spleen: Normal in size without focal abnormality. Adrenals/Urinary Tract: Adrenal glands are normal. Cortical scarring within the upper pole of the left kidney. Small exophytic enhancing renal mass measuring 15 mm off the upper pole left kidney, stable since 2020. Thick-walled urinary bladder with perivesical stranding and mild mucosal enhancement. Catheter in the bladder, the tip of the catheter appears to be within the bladder wall with small focus of gas at the superficial surface of the bladder, series 9 image 96 and spine, coronal series 7 image 83 and 84. Stomach/Bowel: Stomach nonenlarged. No dilated small bowel. Large stool burden. No acute bowel wall thickening. Negative appendix. Vascular/Lymphatic: Aortic atherosclerosis. No enlarged abdominal or pelvic lymph nodes. Reproductive: IUD in the uterus.  No adnexal mass. Other: No free fluid or free air Musculoskeletal: No acute or suspicious osseous abnormality. Multilevel degenerative change. Ascending thoracic stimulator leads incompletely visualized. IMPRESSION: 1. Thick-walled urinary bladder with perivesical stranding and mild mucosal enhancement, findings suggestive of cystitis. Catheter in the bladder which is decompressed, the tip of the catheter appears to be within the bladder wall and may superficially perforate the superior wall of the bladder. 2. Large stool burden suggests constipation. 3. 15 mm enhancing renal mass/possible small renal cell carcinoma off the upper pole of the left kidney, stable since 2020. 4. Aortic atherosclerosis. Electronically Signed   By: Jasmine Pang M.D.   On:  11/09/2023 20:42    5:00 AM CT cystogram without active extravasation, does have some indentation of the bladder wall.  This may be from the Foley balloon.  Also has some contrast in the inferior vaginal area, may be reflux but vesicovaginal fistula not entirely excluded.  Also has bladder wall thickening concerning for cystitis, UA also nitrite +.  She is given dose of IV Rocephin here.    Discussed cystogram results with urology, Dr. Liliane Shi-- none of this is overtly concerning for perforation, does not need IP hospitalization or any emergent intervention.  Can follow-up OP if desired but that is not necessarily needed either.  Will plan to d/c home with keflex given UA and CT findings today.  Also encouraged bowel regimen at home.  Encouraged to follow-up closely with PCP.  Return here for new concerns.   Sharilyn Sites  Blair Heys 11/10/23 [id number]    Nicanor Alcon, April, MD 11/10/23 [id number]

## 2023-11-10 NOTE — Progress Notes (Signed)
   11/10/23 0015  BiPAP/CPAP/SIPAP  $ Non-Invasive Home Ventilator  Initial  BiPAP/CPAP/SIPAP Pt Type Adult  BiPAP/CPAP/SIPAP (S)  Resmed (Pts home machine)  Mask Type Nasal mask  Dentures removed? Not applicable  Respiratory Rate 17 breaths/min  Flow Rate 5 lpm  Patient Home Equipment (S)  Yes (sterile water provided. machine all set up. Per pt family she uses 5L with cpap, Pt on cpap no issues.)  Safety Check Completed by RT for Home Unit Yes, no issues noted  BiPAP/CPAP /SiPAP Vitals  Pulse Rate 100  Resp 17  SpO2 100 %  Bilateral Breath Sounds Diminished  MEWS Score/Color  MEWS Score 2  MEWS Score Color Yellow

## 2023-11-12 LAB — URINE CULTURE: Culture: >=100000 — AB

## 2023-11-13 ENCOUNTER — Telehealth (HOSPITAL_BASED_OUTPATIENT_CLINIC_OR_DEPARTMENT_OTHER): Payer: Self-pay | Admitting: *Deleted

## 2023-11-13 NOTE — Telephone Encounter (Signed)
 Post ED Visit - Positive Culture Follow-up: Successful Patient Follow-Up  Culture assessed and recommendations reviewed by:  []  Enzo Bi, Pharm.D. []  Celedonio Miyamoto, Pharm.D., BCPS AQ-ID []  Garvin Fila, Pharm.D., BCPS [x]  Georgina Pillion, Pharm.D., BCPS []  [place], [address].D., BCPS, AAHIVP []  Estella Husk, Pharm.D., BCPS, AAHIVP []  Lysle Pearl, PharmD, BCPS []  Phillips Climes, PharmD, BCPS []  Agapito Games, PharmD, BCPS []  Verlan Friends, PharmD  Positive urine culture Patient  Instructed to stop taking antibiotic cephalexin. No additional antibiotics are indicate at this time. Changes discussed with ED provider: Benjiman Core Contacted patient, date 11/13/2023, time 0940   Bing Quarry 11/13/2023, 9:41 AM

## 2023-11-13 NOTE — Progress Notes (Signed)
 ED Antimicrobial Stewardship Positive Culture Follow Up   Toni [name] is an 77 y.o. female who presented to [facility] on 11/09/2023 with a chief complaint of  Chief Complaint  Patient presents with   Abdominal Pain    Recent Results (from the past 720 hours)  Urine Culture     Status: Abnormal   Collection Time: 11/09/23  7:04 PM   Specimen: Urine, Catheterized  Result Value Ref Range Status   Specimen Description   Final    URINE, CATHETERIZED Performed at [facility], [address] [place], [place] [address]    Special Requests   Final    NONE Performed at [facility], [address] [place], [place] [address]    Culture (A)  Final    80,000 COLONIES/mL ESCHERICHIA COLI >=100,000 COLONIES/mL PSEUDOMONAS AERUGINOSA Confirmed Extended Spectrum Beta-Lactamase Producer (ESBL).  In bloodstream infections from ESBL organisms, carbapenems are preferred over piperacillin/tazobactam. They are shown to have a lower risk of mortality.    Report Status 11/12/2023 FINAL  Final   Organism ID, Bacteria ESCHERICHIA COLI (A)  Final   Organism ID, Bacteria PSEUDOMONAS AERUGINOSA (A)  Final      Susceptibility   Escherichia coli - MIC*    AMPICILLIN >=32 RESISTANT Resistant     CEFAZOLIN >=64 RESISTANT Resistant     CEFEPIME 16 RESISTANT Resistant     CEFTRIAXONE >=64 RESISTANT Resistant     CIPROFLOXACIN >=4 RESISTANT Resistant     GENTAMICIN <=1 SENSITIVE Sensitive     IMIPENEM <=0.25 SENSITIVE Sensitive     NITROFURANTOIN <=16 SENSITIVE Sensitive     TRIMETH/SULFA >=320 RESISTANT Resistant     AMPICILLIN/SULBACTAM 4 SENSITIVE Sensitive     PIP/TAZO <=4 SENSITIVE Sensitive ug/mL    * 80,000 COLONIES/mL ESCHERICHIA COLI   Pseudomonas aeruginosa - MIC*    CEFTAZIDIME 2 SENSITIVE Sensitive     CIPROFLOXACIN 0.5 SENSITIVE Sensitive     GENTAMICIN 4 SENSITIVE Sensitive     IMIPENEM 2 SENSITIVE Sensitive     PIP/TAZO <=4 SENSITIVE Sensitive  ug/mL    CEFEPIME 2 SENSITIVE Sensitive     * >=100,000 COLONIES/mL PSEUDOMONAS AERUGINOSA    [x]  No treatment indicated  64 YOF who presented for work-up for constipation, concern for ileus. 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 Abd CT showed large stool burden and concern for the bladder catheter being embedded/too close to the bladder wall. This was pulled back and urology recommended a cystogram which did not show concern for bladder perforation.  The patient was treated for a UTI based on imaging findings and +UA, however was not symptomatic, lacked fevers, or leukocytosis. Cultures revealed organisms the patient has been known to be previously colonized with.  Call the patient to STOP cephalexin - no additional antibiotics warranted at this time.   ED Provider: Benjiman Core, MD  Thank you for allowing pharmacy to be a part of this patient's care.  Georgina Pillion, PharmD, BCPS, BCIDP Infectious Diseases Clinical Pharmacist 11/13/2023 9:22 AM   **Pharmacist phone directory can now be found on [email or web address] ([email or web address]).  Listed under [facility].

## 2023-11-14 ENCOUNTER — Telehealth: Payer: Self-pay | Admitting: Neurology

## 2023-11-14 ENCOUNTER — Encounter: Payer: Self-pay | Admitting: Neurology

## 2023-11-14 MED ORDER — ACTHAR 80 UNIT/ML IJ GEL: INTRAMUSCULAR | 5 refills | Status: DC

## 2023-11-14 NOTE — Telephone Encounter (Signed)
Refill sent for the patient.  

## 2023-11-14 NOTE — Telephone Encounter (Addendum)
 Pt called stating that she was having a MS attack that is affecting her R side and is wanting to know if 10 corticotropin (ACTHAR) 80 UNIT/ML injectable gel through [facility]  Please advise.

## 2023-11-14 NOTE — Addendum Note (Signed)
 Addended by: Judi Cong on: 11/14/2023 01:49 PM   Modules accepted: Orders

## 2023-11-20 ENCOUNTER — Ambulatory Visit
Admission: RE | Admit: 2023-11-20 | Discharge: 2023-11-20 | Disposition: A | Discharge status: Home / Self Care | Source: Ambulatory Visit | Attending: Obstetrics and Gynecology | Admitting: Obstetrics and Gynecology

## 2023-11-20 DIAGNOSIS — Z1231 Encounter for screening mammogram for malignant neoplasm of breast: Secondary | ICD-10-CM

## 2023-11-29 ENCOUNTER — Telehealth: Payer: Self-pay | Admitting: Adult Health

## 2023-11-29 NOTE — Telephone Encounter (Signed)
 Sent mychart msg informing pt of appt change due to NP schedule change

## 2023-12-10 ENCOUNTER — Telehealth: Payer: Self-pay | Admitting: Neurology

## 2023-12-10 NOTE — Telephone Encounter (Signed)
 [facility] called to confirm patients appointment

## 2023-12-17 ENCOUNTER — Ambulatory Visit (INDEPENDENT_AMBULATORY_CARE_PROVIDER_SITE_OTHER): Payer: Medicare Other | Admitting: Neurology

## 2023-12-17 ENCOUNTER — Encounter: Payer: Self-pay | Admitting: Neurology

## 2023-12-17 VITALS — BP 123/68 | HR 69

## 2023-12-17 DIAGNOSIS — G35 Multiple sclerosis: Secondary | ICD-10-CM

## 2023-12-17 DIAGNOSIS — G8250 Quadriplegia, unspecified: Secondary | ICD-10-CM | POA: Diagnosis not present

## 2023-12-17 DIAGNOSIS — G4737 Central sleep apnea in conditions classified elsewhere: Secondary | ICD-10-CM

## 2023-12-17 DIAGNOSIS — I509 Heart failure, unspecified: Secondary | ICD-10-CM | POA: Diagnosis not present

## 2023-12-17 DIAGNOSIS — Z978 Presence of other specified devices: Secondary | ICD-10-CM

## 2023-12-17 NOTE — Progress Notes (Signed)
 [facility]  PATIENT: Toni [name] DOB: 22-Jun-1946  REFERRING DOCTOR OR PCP: Dr. [name] Hugger SOURCE: Patient, notes from Dr. [name] Hugger and Dr. Tilda Fogo  _________________________________   HISTORICAL  CHIEF COMPLAINT:  Chief Complaint  Patient presents with   Follow-up    Pt in room 1. Husband [name] in room. Here for baclofen  pump refill.    HISTORY OF PRESENT ILLNESS:  Toni [name] is a 78 year old woman with multiple sclerosis.  Aaron Aas    UPDATE 12/17/2023 She reports that the spasticity is doing well and she is not experiencing any significant spasming during the day or night.  Pain is well-controlled.. She notes that she had been on ACTHar  in the past and felt better when she was on it.  She has tolerated that better than she did the prednisone .  The spasticity is well-controlled with the baclofen  pump.  She has had no painful spasms but does note some increased tone when she is moved.  She requires refills every 2 to 3 months.   She had her pump replaced in 2022 (Dr. Nigel Bart) and it should not need to be replaced until 2029.     She is in [facility] in [place].   Current pump is 40 cc.  She receives a benefit from the baclofen  pump.  She is on third pump - first one 2009, last one July 2022).  Since her spasticity is stable we will keep the dose the same.  She has not needed to take any oral baclofen .  She is unable to walk but can assist some with transfers.  She uses a standing frame.  She is able to move her legs in bed and to go on her side in bed.  She also notes weakness in her hands.  She has dysesthesias in her abdomen and legs.  Gabapentin  is helping some..     She has difficulty with a neurogenic bladder and has frequent UTIs.  Spasticity is worse when she is sick.  Vision is doing well.   REVIEW OF SYSTEMS: Constitutional: No fevers, chills, sweats, or change in appetite Eyes: No visual changes, double vision, eye pain Ear, nose and throat:  No hearing loss, ear pain, nasal congestion, sore throat Cardiovascular: No chest pain, palpitations Respiratory:  No shortness of breath at rest or with exertion.   No wheezes GastrointestinaI: No nausea, vomiting, diarrhea, abdominal pain, fecal incontinence Genitourinary:  No dysuria, urinary retention or frequency.  No nocturia. Musculoskeletal:  No neck pain, back pain Integumentary: No rash, pruritus, skin lesions Neurological: as above Psychiatric: No depression at this time.  No anxiety Endocrine: No palpitations, diaphoresis, change in appetite, change in weigh or increased thirst Hematologic/Lymphatic:  No anemia, purpura, petechiae. Allergic/Immunologic: No itchy/runny eyes, nasal congestion, recent allergic reactions, rashes  ALLERGIES: Allergies  Allergen Reactions   Other Hives and Rash    Topical Surgical prep  - severe rash, hives   Hydrocodone  Nausea And Vomiting   Oxycontin  [Oxycodone ] Nausea And Vomiting   Zoloft [Sertraline] Hives, Swelling and Rash   Chloraseptic [Phenol] Hives   Macrobid  [Nitrofurantoin ] Nausea And Vomiting    Not documented on MAR   Vioxx [Rofecoxib] Other (See Comments)    Unknown reaction Can take NSAIDS   Hibiclens  [Chlorhexidine  Gluconate] Hives, Rash and Other (See Comments)    Sores    HOME MEDICATIONS:  Current Outpatient Medications:    acetaminophen  (TYLENOL ) 500 MG tablet, Take 1,000 mg by mouth every 8 (eight) hours as needed for fever or  headache (pain)., Disp: , Rfl:    ALPRAZolam  (XANAX ) 0.5 MG tablet, Take 1 tablet (0.5 mg total) by mouth at bedtime. May also take 1 tablet every 12 hours as needed anxiety (Patient taking differently: Take 0.5 mg by mouth See admin instructions. Take 0.5 mg (1 tablet) by mouth at bedtime. May also take an additional 1 tablet every 12 hours as needed for anxiety.), Disp: 10 tablet, Rfl: 0   amiodarone  (PACERONE ) 200 MG tablet, Take 200 mg by mouth every evening., Disp: , Rfl:    apixaban   (ELIQUIS ) 5 MG TABS tablet, Take 5 mg by mouth 2 (two) times daily., Disp: , Rfl:    baclofen  (LIORESAL ) 20 MG tablet, Take 20 mg by mouth daily as needed for muscle spasms., Disp: , Rfl:    Biotin  10000 MCG TABS, Take 1 tablet by mouth daily., Disp: , Rfl:    bisacodyl  (DULCOLAX) 10 MG suppository, Place 10 mg rectally See admin instructions. Insert 10 mg (1 suppository) rectally once daily on Sunday, Tuesday, Thursday. May use an additional suppository once daily as needed for constipation., Disp: , Rfl:    buPROPion  (WELLBUTRIN  XL) 150 MG 24 hr tablet, Take 1 tablet (150 mg total) by mouth daily., Disp: , Rfl:    cephALEXin  (KEFLEX ) 500 MG capsule, Take 1 capsule (500 mg total) by mouth 2 (two) times daily., Disp: 20 capsule, Rfl: 0   cetirizine  (ZYRTEC ) 10 MG tablet, Take 10 mg by mouth daily as needed for allergies., Disp: , Rfl:    cholecalciferol  (VITAMIN D3) 25 MCG (1000 UNIT) tablet, Take 2,000 Units by mouth 2 (two) times daily., Disp: , Rfl:    clotrimazole  (LOTRIMIN ) 1 % cream, Apply 1 Application topically 2 (two) times daily. To perineal area and abdominal folds, Disp: , Rfl:    corticotropin  (ACTHAR ) 80 UNIT/ML injectable gel, Inject 80 mg every other day for 10 days and repeat monthy, Disp: 5 mL, Rfl: 5   diphenhydrAMINE  (BENADRYL ) 25 mg capsule, Take 25 mg by mouth 3 (three) times daily as needed (minor itching, irritation)., Disp: , Rfl:    docusate sodium  (COLACE) 100 MG capsule, Take 100 mg by mouth 2 (two) times daily., Disp: , Rfl:    Emollient (AQUAPHOR ADV PROTECT HEALING) 41 % OINT, Apply 1 application  topically See admin instructions. Apply topically every shift. Apply to redness in crease of buttocks every shift with and with each toileting times., Disp: , Rfl:    Emollient (CERAVE DAILY MOISTURIZING) LOTN, Apply 1 application  topically daily. To groin, inguinal area, Disp: , Rfl:    famotidine  (PEPCID ) 20 MG tablet, Take 20 mg by mouth 2 (two) times daily., Disp: , Rfl:     fluticasone  (FLONASE ) 50 MCG/ACT nasal spray, Place 1 spray into both nostrils daily as needed (for nasal congestion)., Disp: , Rfl:    gabapentin  (NEURONTIN ) 300 MG capsule, Mrs. Stough will take 300 mg of gabapentin  at 6 AM, 10 AM, 2 PM and 6 PM and 600 mg at 10 PM by mouth. (Patient taking differently: Take 300 mg by mouth 4 (four) times daily.), Disp: 180 capsule, Rfl: 11   hydrocortisone  (ANUSOL -HC) 25 MG suppository, Place 1 suppository (25 mg total) rectally 2 (two) times daily., Disp: 14 suppository, Rfl: 4   levothyroxine  (SYNTHROID ) 75 MCG tablet, Take 75 mcg by mouth daily before breakfast., Disp: , Rfl:    melatonin 5 MG TABS, Take 5 mg by mouth at bedtime., Disp: , Rfl:    Menthol , Topical Analgesic, (  BIOFREEZE COOL THE PAIN) 4 % GEL, Apply 1 application  topically 3 (three) times daily. To left calf, Disp: , Rfl:    metoprolol  tartrate (LOPRESSOR ) 50 MG tablet, Take 0.5 tablets (25 mg total) by mouth daily., Disp: 30 tablet, Rfl: 0   midodrine  (PROAMATINE ) 5 MG tablet, Take 1 tablet (5 mg total) by mouth 3 (three) times daily with meals. (Patient taking differently: Take 5 mg by mouth every 8 (eight) hours as needed (SBP < 100).), Disp: , Rfl:    ondansetron  (ZOFRAN -ODT) 4 MG disintegrating tablet, Take 4 mg by mouth 4 (four) times daily as needed for nausea., Disp: , Rfl:    oxybutynin  (DITROPAN ) 5 MG tablet, Take 5 mg by mouth 2 (two) times daily as needed (overactive bladder)., Disp: , Rfl:    phenazopyridine  (PYRIDIUM ) 100 MG tablet, Take 100 mg by mouth every 8 (eight) hours as needed (dysuria)., Disp: , Rfl:    phenylephrine -shark liver oil-mineral oil-petrolatum (PREPARATION H) 0.25-14-74.9 % rectal ointment, Place 1 Application rectally 3 (three) times daily as needed for hemorrhoids., Disp: , Rfl:    Polyethyl Glycol-Propyl Glycol (SYSTANE) 0.4-0.3 % SOLN, Place 1 drop into both eyes in the morning and at bedtime., Disp: , Rfl:    polyethylene glycol (MIRALAX  / GLYCOLAX ) 17 g  packet, Take 17 g by mouth daily as needed for mild constipation, moderate constipation or severe constipation., Disp: , Rfl:    potassium chloride  SA (KLOR-CON ) 20 MEQ tablet, Take 2 tablets (40 mEq total) by mouth daily. (Patient taking differently: Take 40 mEq by mouth in the morning.), Disp: 180 tablet, Rfl: 3   promethazine  (PHENERGAN ) 12.5 MG tablet, Take 12.5 mg by mouth every 8 (eight) hours as needed for nausea or vomiting., Disp: , Rfl:    Prucalopride Succinate  (MOTEGRITY ) 2 MG TABS, Take 2 mg by mouth daily before breakfast., Disp: , Rfl:    Psyllium (METAMUCIL SMOOTH TEXTURE PO), Take 1 packet by mouth 2 (two) times daily., Disp: , Rfl:    Salicylic Acid-Cleanser 6 % CREAM KIT, Apply 1 each topically See admin instructions. Apply topically once a day on Monday, Wednesday, Saturday to lesion on foot 3x weekly. Once soft, gently file with disposible nail file weekly, repeat until resolved., Disp: , Rfl:    senna (SENOKOT) 8.6 MG TABS tablet, Take 17.2 mg by mouth 2 (two) times daily., Disp: , Rfl:    silver  sulfADIAZINE (SILVADENE) 1 % cream, Apply 1 Application topically 2 (two) times daily., Disp: , Rfl:    sodium phosphate  (FLEET) 7-19 GM/118ML ENEM, Place 1 enema rectally daily as needed (constipation)., Disp: , Rfl:    torsemide  (DEMADEX ) 20 MG tablet, Take 1.5 tablets (30 mg total) by mouth daily. Weigh yourself daily.  If you gain 2-3 lbs in a day or 5 lbs in a week or develop worsening swelling, take an extra dose of lasix .  You should follow up your dosing with your PCP/ cardiologist outpatient to determine if the dose needs further adjustment. (Patient taking differently: Take 30 mg by mouth daily. Take an extra dose with weight gain  of 2-3lbs in a day and 5lbs in a day or develop worsening swelling.), Disp: , Rfl:    torsemide  (DEMADEX ) 20 MG tablet, Take 20 mg by mouth at bedtime., Disp: , Rfl:    triamcinolone cream (KENALOG) 0.1 %, Apply 1 Application topically 2 (two) times  daily., Disp: , Rfl:    amiodarone  (PACERONE ) 200 MG tablet, Take 1 tablet (200 mg total)  by mouth 2 (two) times daily for 14 days, THEN 1 tablet (200 mg total) daily. Follow up with cardiology outpatient for refills and continued recommendations. (Patient taking differently: Take 2 tablets (400mg ) in the morning.), Disp: , Rfl:   Current Facility-Administered Medications:    baclofen  (GABLOFEN ) intrathecal injection 40000 mcg/20ml, 80,000 mcg, Intrathecal, Once, Aikeem Lilley, Sherida Dimmer, MD  PAST MEDICAL HISTORY: Past Medical History:  Diagnosis Date   Abnormality of gait 03/01/2015   Acute metabolic encephalopathy 06/04/2020   Anxiety    Arthritis    "knees" (01/19/2016)   Asthma    as a child    Atrial fibrillation (HCC)    Back pain    Bilateral carpal tunnel syndrome 10/04/2020   Cancer (HCC)    / RENAL CELL CARCINOMA - GETS CT SCAN EVERY 6 MONTHS TO WATCH    CHF (congestive heart failure) (HCC)    Chronic pain    "nerve pain from the MS"   Complex atypical endometrial hyperplasia    Constipation    COVID-19 long hauler manifesting chronic dyspnea 09/13/2020   COVID-19 virus infection 08/23/2019   Depression    DVT (deep venous thrombosis) (HCC) 1983   "RLE; may have just been phlebitis; it was before the age of dopplers"   Dysrhythmia    afib    Edema    varicose veins with severe venous insuff in R and L GSV' ablation of R GSV 2012   GERD (gastroesophageal reflux disease)    HLD (hyperlipidemia)    Hypertension    Hypotension    Hypothyroidism    Joint pain    Multiple sclerosis (HCC)    has had this may 1989-[facility] reg doc   OSA on CPAP    bipap machine - SLEEP APNEA WORSE SINCE COVID IN 1/21 PER PATIENT SETTING HAS INCREASED FROM 9 TO 30    Osteoarthritis    PAF (paroxysmal atrial fibrillation) (HCC)    on coumadin ; documented on monitor 06/2010 - no longer takes per [facility] 02/09/21   Pneumonia 11/2011   Recurrent UTI 04/07/2020   Sepsis (HCC) 06/04/2020    Sepsis secondary to UTI (HCC) 11/20/2022   Sleep apnea    uses sleep apnea machine    PAST SURGICAL HISTORY: Past Surgical History:  Procedure Laterality Date   ANTERIOR CERVICAL DECOMP/DISCECTOMY FUSION  01/2004   CARDIAC CATHETERIZATION  06/22/2010   normal coronary arteries, PAF   CATARACT EXTRACTION, BILATERAL Bilateral    CHILD BIRTHS     X2   DILATATION & CURETTAGE/HYSTEROSCOPY WITH MYOSURE N/A 01/15/2020   Procedure: DILATATION & CURETTAGE/HYSTEROSCOPY WITH MYOSURE;  Surgeon: Merryl Abraham, MD;  Location: [facility];  Service: Gynecology;  Laterality: N/A;  pt in wheelchair-has MS   DILATION AND CURETTAGE OF UTERUS     DILATION AND CURETTAGE OF UTERUS N/A 02/16/2020   Procedure: DILATATION AND CURETTAGE;  Surgeon: Suzi Essex, MD;  Location: [facility];  Service: Gynecology;  Laterality: N/A;  NOT GENERAL -NO INTUBATION   DNC     INFUSION PUMP IMPLANTATION  ~ 2009   baclofen  infusion in lower abd   INTRATHECAL PUMP REVISION N/A 02/18/2021   Procedure: Baclofen  Pump replacement with possible revision of catheter;  Surgeon: Manya Sells, MD;  Location: [facility];  Service: Neurosurgery;  Laterality: N/A;  3C/RM 20   INTRAUTERINE DEVICE (IUD) INSERTION N/A 02/16/2020   Procedure: INTRAUTERINE DEVICE (IUD) INSERTION - MIRENA ;  Surgeon: Suzi Essex, MD;  Location: [facility];  Service: Gynecology;  Laterality:  N/A;   IUD put in uterus     PAIN PUMP REVISION N/A 07/29/2014   Procedure: Baclofen  pump replacement;  Surgeon: Manya Sells, MD;  Location: [facility];  Service: Neurosurgery;  Laterality: N/A;  Baclofen  pump replacement   TONSILLECTOMY AND ADENOIDECTOMY     VARICOSE VEIN SURGERY  ~ 1968   VENOUS ABLATION  12/16/2010   radiofreq ablation -Dr Johnney Nam and [facility]   WISDOM TOOTH EXTRACTION      FAMILY HISTORY: Family History  Problem Relation Age of Onset   Depression Mother    Sudden death Mother    Anxiety disorder Mother    Coronary artery disease Father        at  age 16   Hyperlipidemia Father    Thyroid  disease Father    Coronary artery disease Maternal Grandmother    Depression Maternal Grandmother    Cancer Paternal Grandmother        breast   Alcoholism Son    Sleep apnea Neg Hx    Multiple sclerosis Neg Hx     SOCIAL HISTORY:  Social History   Socioeconomic History   Marital status: Married    Spouse name: [name]   Number of children: 2   Years of education: COLLEGE   Highest education level: Not on file  Occupational History   Occupation: RETIRED  Tobacco Use   Smoking status: Former    Current packs/day: 0.00    Average packs/day: 0.5 packs/day for 7.0 years (3.5 ttl pk-yrs)    Types: Cigarettes    Start date: 08/21/1969    Quit date: 08/21/1976    Years since quitting: 47.3   Smokeless tobacco: Never  Vaping Use   Vaping status: Never Used  Substance and Sexual Activity   Alcohol  use: Not Currently   Drug use: No   Sexual activity: Not Currently    Birth control/protection: Post-menopausal  Other Topics Concern   Not on file  Social History Narrative   Lives in [place] with Husband Currie Douse [phone], [phone]   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   Right handed   Drinks a little caffeine in coffee sometimes    [place]    Pt [place]    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 Strain: Not on file  Food Insecurity: No Food Insecurity (08/13/2023)   Hunger Vital Sign    Worried About Running Out of Food in the Last Year: Never true    Ran Out of Food in the Last Year: Never true  Transportation Needs: No Transportation Needs (08/13/2023)   PRAPARE - Administrator, Civil Service (Medical): No    Lack of Transportation (Non-Medical): No  Physical Activity: Not on file  Stress: Not on file  Social Connections: Not on file  Intimate Partner Violence: Not At Risk (08/13/2023)   Humiliation, Afraid, Rape, and Kick questionnaire    Fear of Current or Ex-Partner: No    Emotionally Abused: No     Physically Abused: No    Sexually Abused: No     PHYSICAL EXAM  Vitals:   12/17/23 1445  BP: 123/68  Pulse: 69       There is no height or weight on file to calculate BMI.   General: The patient is well-developed and well-nourished and in no acute distress  HEENT:  Head is [place]/AT.    Neurologic Exam  Mental status: The patient is alert and oriented x 3 at the time of the examination.  The patient has apparent normal recent and remote memory, with an apparently normal attention span and concentration ability.   .  Cranial nerves: Extraocular movements are full.  Facial strength was normal  Motor:  Muscle bulk reduced at the thenar eminences.  Hypotonic in the legs.  SHe has reduced grip bilaterally and 3/5 strength in the intrinsic hand muscles.  Strength is 2+/5 in the left leg and 2/5 in the right leg.  Sensory: Sensory testing is intact to touch x4.  Gait and station: She is wheelchair-bound..   Reflexes: Deep tendon reflexes are symmetric in the arms and absent in the legs.  No ankle clonus.    DIAGNOSTIC DATA (LABS, IMAGING, TESTING) - I reviewed patient records, labs, notes, testing and imaging myself where available.  Lab Results  Component Value Date   WBC 6.7 11/09/2023   HGB 14.3 11/09/2023   HCT 43.2 11/09/2023   MCV 93.9 11/09/2023   PLT 219 11/09/2023      Component Value Date/Time   NA 134 (L) 11/09/2023 1857   NA 144 12/15/2020 1100   K 3.2 (L) 11/09/2023 1857   CL 93 (L) 11/09/2023 1857   CO2 30 11/09/2023 1857   GLUCOSE 84 11/09/2023 1857   BUN 10 11/09/2023 1857   BUN 27 12/15/2020 1100   CREATININE 0.60 11/09/2023 1857   CALCIUM  8.6 (L) 11/09/2023 1857   PROT 6.8 11/09/2023 1857   PROT 5.9 (L) 09/20/2020 1436   ALBUMIN 3.8 11/09/2023 1857   ALBUMIN 3.9 09/20/2020 1436   AST 27 11/09/2023 1857   ALT 47 (H) 11/09/2023 1857   ALKPHOS 57 11/09/2023 1857   BILITOT 0.8 11/09/2023 1857   BILITOT 0.3 09/20/2020 1436   GFRNONAA >60  11/09/2023 1857   GFRAA 90 09/20/2020 1436   Lab Results  Component Value Date   CHOL 111 03/19/2019   HDL 57 03/19/2019   LDLCALC 43 03/19/2019   TRIG 53 03/19/2019   CHOLHDL 3.0 04/22/2010   Lab Results  Component Value Date   HGBA1C 4.9 06/05/2020   No results found for: "VITAMINB12" Lab Results  Component Value Date   TSH 1.494 06/14/2023       ASSESSMENT AND PLAN  Spastic quadriplegia (HCC)  Presence of intrathecal pump  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  11 cc of unused baclofen  was removed..  The baclofen  pump was refilled with baclofen  (40 mL) at 2000 mg per ml.   We will keep the rate and the bolus the same.Aaron Aas She will return to see us  in about 3 months for the next pump refill.  She should call us  or Dr. [name] Hugger sooner for new or worsening symptoms..   F/u with Dr. [name] Hugger for other MS / neuro issues   Tajay Muzzy A. Godwin Lat, MD, [facility] 12/17/2023, 4:29 PM Certified in Neurology, Clinical Neurophysiology, Sleep Medicine and Neuroimaging  [facility] [address], [address] [place], [place] [address] [phone]

## 2023-12-17 NOTE — Progress Notes (Signed)
 RM 1, with husband. Skin prepped with Betadine and draped in sterile fashion. Center port of implanted pump accessed using 22g huber needle.  Residual fluid measuring 11 ml was removed from the reservoir.  Gablofen  2022mcg/mlx2 instilled into pump. Pump reprogrammed to reflect a volume of 40ml.  ERI is 10/2027.    Next appt given for 02/25/24 at 3:00pm with Dr. Godwin Lat.  Next alarm date: 03/07/2024.  Medication placed in pump: Gablofen  40,066mcg,20ml x2. Lot: [id number] Exp: 07/2025. NDC: [id number].

## 2023-12-18 ENCOUNTER — Ambulatory Visit: Payer: Medicare Other | Admitting: Adult Health

## 2023-12-21 ENCOUNTER — Ambulatory Visit: Admitting: Gastroenterology

## 2024-01-02 ENCOUNTER — Ambulatory Visit: Admitting: Internal Medicine

## 2024-01-28 ENCOUNTER — Encounter (HOSPITAL_BASED_OUTPATIENT_CLINIC_OR_DEPARTMENT_OTHER): Payer: Self-pay | Admitting: Internal Medicine

## 2024-01-28 NOTE — Telephone Encounter (Signed)
 Please review and advise.

## 2024-02-11 LAB — BASIC METABOLIC PANEL WITH GFR: EGFR (Non-African Amer.): >90

## 2024-02-12 ENCOUNTER — Ambulatory Visit: Attending: Nurse Practitioner | Admitting: Nurse Practitioner

## 2024-02-12 ENCOUNTER — Encounter: Payer: Self-pay | Admitting: Nurse Practitioner

## 2024-02-12 VITALS — BP 104/48 | HR 60 | Ht 63.0 in | Wt 204.0 lb

## 2024-02-12 DIAGNOSIS — I4821 Permanent atrial fibrillation: Secondary | ICD-10-CM | POA: Diagnosis present

## 2024-02-12 DIAGNOSIS — E782 Mixed hyperlipidemia: Secondary | ICD-10-CM | POA: Insufficient documentation

## 2024-02-12 DIAGNOSIS — I5042 Chronic combined systolic (congestive) and diastolic (congestive) heart failure: Secondary | ICD-10-CM | POA: Insufficient documentation

## 2024-02-12 DIAGNOSIS — I10 Essential (primary) hypertension: Secondary | ICD-10-CM | POA: Diagnosis not present

## 2024-02-12 DIAGNOSIS — I872 Venous insufficiency (chronic) (peripheral): Secondary | ICD-10-CM | POA: Diagnosis present

## 2024-02-12 DIAGNOSIS — I428 Other cardiomyopathies: Secondary | ICD-10-CM | POA: Insufficient documentation

## 2024-02-12 DIAGNOSIS — E039 Hypothyroidism, unspecified: Secondary | ICD-10-CM | POA: Diagnosis present

## 2024-02-12 MED ORDER — TORSEMIDE 20 MG PO TABS: 20.0000 mg | ORAL_TABLET | Freq: Every day | ORAL | 3 refills | Status: AC | PRN

## 2024-02-12 NOTE — Patient Instructions (Signed)
 Medication Instructions:  Amiodarone  100 mg twice daily Continue Torsemide  40 mg twice daily. May take an additional 20 mg on the days of swelling or weight gain of 3 lb overnight or 5 lb in 1 week.   *If you need a refill on your cardiac medications before your next appointment, please call your pharmacy*  Lab Work: CBC, BNP, LFTs, TSH, today  Testing/Procedures: NONE ordered at this time of appointment   Follow-Up: At [facility], you and your health needs are our priority.  As part of our continuing mission to provide you with exceptional heart care, our providers are all part of one team.  This team includes your primary Cardiologist (physician) and Advanced Practice Providers or APPs (Physician Assistants and Nurse Practitioners) who all work together to provide you with the care you need, when you need it.  Your next appointment:   1 month(s)  Provider:   Vinie JAYSON Maxcy, MD or any APP    We recommend signing up for the patient portal called MyChart.  Sign up information is provided on this After Visit Summary.  MyChart is used to connect with patients for Virtual Visits (Telemedicine).  Patients are able to view lab/test results, encounter notes, upcoming appointments, etc.  Non-urgent messages can be sent to your provider as well.   To learn more about what you can do with MyChart, go to [email or web address].

## 2024-02-12 NOTE — Progress Notes (Signed)
 Office Visit    Patient Name: Toni [name] Date of Encounter: 02/12/2024  Primary Care Provider:  Albina GORMAN Dine, MD Primary Cardiologist:  Vinie JAYSON Maxcy, MD  Chief Complaint    78 year old female with a history of chronic combined systolic and diastolic heart failure, NICM, permanent atrial fibrillation, hypertension, hypotension, hyperlipidemia, chronic venous insufficiency, multiple sclerosis, hypothyroidism, and OSA who presents for follow-up related to heart failure and atrial fibrillation.  Past Medical History    Past Medical History:  Diagnosis Date   Abnormality of gait 03/01/2015   Acute metabolic encephalopathy 06/04/2020   Anxiety    Arthritis    knees (01/19/2016)   Asthma    as a child    Atrial fibrillation (HCC)    Back pain    Bilateral carpal tunnel syndrome 10/04/2020   Cancer (HCC)    / RENAL CELL CARCINOMA - GETS CT SCAN EVERY 6 MONTHS TO WATCH    CHF (congestive heart failure) (HCC)    Chronic pain    nerve pain from the MS   Complex atypical endometrial hyperplasia    Constipation    COVID-19 long hauler manifesting chronic dyspnea 09/13/2020   COVID-19 virus infection 08/23/2019   Depression    DVT (deep venous thrombosis) (HCC) 1983   RLE; may have just been phlebitis; it was before the age of dopplers   Dysrhythmia    afib    Edema    varicose veins with severe venous insuff in R and L GSV' ablation of R GSV 2012   GERD (gastroesophageal reflux disease)    HLD (hyperlipidemia)    Hypertension    Hypotension    Hypothyroidism    Joint pain    Multiple sclerosis (HCC)    has had this may 1989-[facility] reg doc   OSA on CPAP    bipap machine - SLEEP APNEA WORSE SINCE COVID IN 1/21 PER PATIENT SETTING HAS INCREASED FROM 9 TO 30    Osteoarthritis    PAF (paroxysmal atrial fibrillation) (HCC)    on coumadin ; documented on monitor 06/2010 - no longer takes per [facility] 02/09/21   Pneumonia 11/2011   Recurrent UTI 04/07/2020    Sepsis (HCC) 06/04/2020   Sepsis secondary to UTI (HCC) 11/20/2022   Sleep apnea    uses sleep apnea machine   Past Surgical History:  Procedure Laterality Date   ANTERIOR CERVICAL DECOMP/DISCECTOMY FUSION  01/2004   CARDIAC CATHETERIZATION  06/22/2010   normal coronary arteries, PAF   CATARACT EXTRACTION, BILATERAL Bilateral    CHILD BIRTHS     X2   DILATATION & CURETTAGE/HYSTEROSCOPY WITH MYOSURE N/A 01/15/2020   Procedure: DILATATION & CURETTAGE/HYSTEROSCOPY WITH MYOSURE;  Surgeon: Leva Rush, MD;  Location: [facility];  Service: Gynecology;  Laterality: N/A;  pt in wheelchair-has MS   DILATION AND CURETTAGE OF UTERUS     DILATION AND CURETTAGE OF UTERUS N/A 02/16/2020   Procedure: DILATATION AND CURETTAGE;  Surgeon: Viktoria Comer SAUNDERS, MD;  Location: [facility];  Service: Gynecology;  Laterality: N/A;  NOT GENERAL -NO INTUBATION   DNC     INFUSION PUMP IMPLANTATION  ~ 2009   baclofen  infusion in lower abd   INTRATHECAL PUMP REVISION N/A 02/18/2021   Procedure: Baclofen  Pump replacement with possible revision of catheter;  Surgeon: Unice Pac, MD;  Location: [facility];  Service: Neurosurgery;  Laterality: N/A;  3C/RM 20   INTRAUTERINE DEVICE (IUD) INSERTION N/A 02/16/2020   Procedure: INTRAUTERINE DEVICE (IUD) INSERTION - MIRENA ;  Surgeon: Viktoria Comer SAUNDERS, MD;  Location: [facility];  Service: Gynecology;  Laterality: N/A;   IUD put in uterus     PAIN PUMP REVISION N/A 07/29/2014   Procedure: Baclofen  pump replacement;  Surgeon: Fairy Levels, MD;  Location: [facility];  Service: Neurosurgery;  Laterality: N/A;  Baclofen  pump replacement   TONSILLECTOMY AND ADENOIDECTOMY     VARICOSE VEIN SURGERY  ~ 1968   VENOUS ABLATION  12/16/2010   radiofreq ablation -Dr Leafy and Hilty   WISDOM TOOTH EXTRACTION      Allergies  Allergies  Allergen Reactions   Other Hives and Rash    Topical Surgical prep  - severe rash, hives   Hydrocodone  Nausea And Vomiting   Oxycontin  [Oxycodone ]  Nausea And Vomiting   Zoloft [Sertraline] Hives, Swelling and Rash   Chloraseptic [Phenol] Hives   Macrobid  [Nitrofurantoin ] Nausea And Vomiting    Not documented on MAR   Vioxx [Rofecoxib] Other (See Comments)    Unknown reaction Can take NSAIDS   Hibiclens  [Chlorhexidine  Gluconate] Hives, Rash and Other (See Comments)    Sores     Labs/Other Studies Reviewed    The following studies were reviewed today:  Cardiac Studies & Procedures   ______________________________________________________________________________________________     ECHOCARDIOGRAM  ECHOCARDIOGRAM COMPLETE 09/20/2023  Narrative ECHOCARDIOGRAM REPORT    Patient Name:   Toni [name] Date of Exam: 09/20/2023 Medical Rec #:  [id number]        Height:       62.0 in Accession #:    [id number]       Weight:       204.6 lb Date of Birth:  30-Apr-1946         BSA:          1.930 m Patient Age:    77 years         BP:           132/82 mmHg Patient Gender: F                HR:           58 bpm. Exam Location:  [address]  Procedure: 2D Echo, Cardiac Doppler and Color Doppler  Indications:    I50.21 Acute Systolic Heart Failure  History:        Patient has prior history of Echocardiogram examinations, most recent 06/22/2022. CHF, Arrythmias:Atrial Fibrillation; Risk Factors:Hypertension and Dyslipidemia. Obstructive sleep apnea-CPAP. Edema.  Sonographer:    Carl Rodgers-Jones RDCS Referring Phys: [id number] KATHRYN M LAWRENCE  IMPRESSIONS   1. Left ventricular ejection fraction, by estimation, is 45%. The left ventricle has mildly decreased function. The left ventricle demonstrates global hypokinesis. Left ventricular diastolic function could not be evaluated. 2. Right ventricular systolic function is normal. The right ventricular size is normal. 3. Left atrial size was mildly dilated. 4. Right atrial size was mildly dilated. 5. The mitral valve is normal in structure. Mild mitral valve regurgitation. No  evidence of mitral stenosis. 6. The aortic valve is normal in structure. Aortic valve regurgitation is not visualized. No aortic stenosis is present. 7. The inferior vena cava is normal in size with greater than 50% respiratory variability, suggesting right atrial pressure of 3 mmHg.  FINDINGS Left Ventricle: Left ventricular ejection fraction, by estimation, is 45%. The left ventricle has mildly decreased function. The left ventricle demonstrates global hypokinesis. The left ventricular internal cavity size was normal in size. There is no left ventricular hypertrophy. Left ventricular diastolic function could  not be evaluated due to atrial fibrillation. Left ventricular diastolic function could not be evaluated.  Right Ventricle: The right ventricular size is normal. No increase in right ventricular wall thickness. Right ventricular systolic function is normal.  Left Atrium: Left atrial size was mildly dilated.  Right Atrium: Right atrial size was mildly dilated.  Pericardium: There is no evidence of pericardial effusion.  Mitral Valve: The mitral valve is normal in structure. Mild mitral valve regurgitation. No evidence of mitral valve stenosis.  Tricuspid Valve: The tricuspid valve is normal in structure. Tricuspid valve regurgitation is trivial. No evidence of tricuspid stenosis.  Aortic Valve: The aortic valve is normal in structure. Aortic valve regurgitation is not visualized. No aortic stenosis is present.  Pulmonic Valve: The pulmonic valve was normal in structure. Pulmonic valve regurgitation is not visualized. No evidence of pulmonic stenosis.  Aorta: The aortic root is normal in size and structure.  Venous: The inferior vena cava is normal in size with greater than 50% respiratory variability, suggesting right atrial pressure of 3 mmHg.  IAS/Shunts: No atrial level shunt detected by color flow Doppler.   LEFT VENTRICLE PLAX 2D LVIDd:         4.90 cm LVIDs:         3.50  cm LV PW:         1.20 cm LV IVS:        0.80 cm LVOT diam:     1.80 cm LV SV:         36 LV SV Index:   19 LVOT Area:     2.54 cm   RIGHT VENTRICLE            IVC RV Basal diam:  3.10 cm    IVC diam: 1.40 cm RV S prime:     8.89 cm/s TAPSE (M-mode): 2.1 cm  LEFT ATRIUM           Index        RIGHT ATRIUM           Index LA diam:      3.90 cm 2.02 cm/m   RA Area:     17.60 cm LA Vol (A2C): 46.3 ml 23.99 ml/m  RA Volume:   43.60 ml  22.59 ml/m LA Vol (A4C): 50.6 ml 26.21 ml/m AORTIC VALVE LVOT Vmax:   68.15 cm/s LVOT Vmean:  44.000 cm/s LVOT VTI:    0.142 m  AORTA Ao Root diam: 2.80 cm Ao Asc diam:  3.00 cm  MV E velocity: 77.73 cm/s SHUNTS Systemic VTI:  0.14 m Systemic Diam: 1.80 cm  Toribio Fuel MD Electronically signed by Toribio Fuel MD Signature Date/Time: 09/20/2023/3:52:27 PM    Final          ______________________________________________________________________________________________     Recent Labs: 06/14/2023: TSH 1.494 08/31/2023: Magnesium  2.4 11/09/2023: ALT 47; BUN 10; Creatinine, Ser 0.60; Hemoglobin 14.3; Platelets 219; Potassium 3.2; Sodium 134  Recent Lipid Panel    Component Value Date/Time   CHOL 111 03/19/2019 1517   TRIG 53 03/19/2019 1517   HDL 57 03/19/2019 1517   CHOLHDL 3.0 04/22/2010 0536   VLDL 29 04/22/2010 0536   LDLCALC 43 03/19/2019 1517    History of Present Illness    78 year old female with the above past medical history including chronic combined systolic and diastolic heart failure, NICM, permanent atrial fibrillation, hypertension, hypotension, hyperlipidemia, chronic venous insufficiency, multiple sclerosis, hypothyroidism, and OSA.  She has a history of A-fib, now rate controlled, on  amiodarone  and Eliquis .  Echocardiogram in 2023 showed EF 45 to 50%, global hypokinesis, mild LVH, normal RV systolic function, no significant valvular abnormalities. Escalation of GDMT has been limited in the setting  of hypotension, requiring midodrine . She also has a history of chronic venous insufficiency per lower extremity venous duplex in 2020.  She was last seen in office on 08/30/2023 and was stable from a cardiac standpoint.  HR was mildly elevated.  Amiodarone  was increased x 3 days, followed by resumption of amiodarone  100 mg twice daily.  Repeat echocardiogram in 08/2023 showed EF 45%, mildly decreased LV function, LV global hypokinesis, indeterminate diastolic parameters, normal RV systolic function, mild BAE, mild mitral valve regurgitation, no significant change from prior study. She was evaluated in the ED in March 2025 in the setting of constipation. Outpatient follow-up with GI was recommended.  She presents today for follow-up accompanied by her caregiver.  Since her last visit she has been stable overall from a cardiac standpoint.  However, she has had a nearly 20 pound weight gain over the past 3 months.  She has nonpitting bilateral lower extremity edema, increased abdominal fullness.  She denies chest pain, palpitations, dizziness, PND, orthopnea.  Approximately 3 weeks ago, the nurse practitioner at her long-term care facility increased her torsemide  to 40 mg twice daily.  Her weight has slowly decreased.  Her blood pressure is low in office today, she is asymptomatic with this.  Home Medications    Current Outpatient Medications  Medication Sig Dispense Refill   acetaminophen  (TYLENOL ) 500 MG tablet Take 1,000 mg by mouth every 8 (eight) hours as needed for fever or headache (pain).     ALPRAZolam  (XANAX ) 0.5 MG tablet Take 1 tablet (0.5 mg total) by mouth at bedtime. May also take 1 tablet every 12 hours as needed anxiety 10 tablet 0   amiodarone  (PACERONE ) 200 MG tablet Take 1 tablet (200 mg total) by mouth 2 (two) times daily for 14 days, THEN 1 tablet (200 mg total) daily. Follow up with cardiology outpatient for refills and continued recommendations.     amiodarone  (PACERONE ) 200 MG tablet  Take 200 mg by mouth every evening. (Patient taking differently: Take 100 mg by mouth 2 (two) times daily.)     apixaban  (ELIQUIS ) 5 MG TABS tablet Take 5 mg by mouth 2 (two) times daily.     baclofen  (LIORESAL ) 20 MG tablet Take 20 mg by mouth daily as needed for muscle spasms.     Biotin  10000 MCG TABS Take 1 tablet by mouth daily.     bisacodyl  (DULCOLAX) 10 MG suppository Place 10 mg rectally See admin instructions. Insert 10 mg (1 suppository) rectally once daily on Sunday, Tuesday, Thursday. May use an additional suppository once daily as needed for constipation.     buPROPion  (WELLBUTRIN  XL) 150 MG 24 hr tablet Take 1 tablet (150 mg total) by mouth daily.     cetirizine  (ZYRTEC ) 10 MG tablet Take 10 mg by mouth daily as needed for allergies.     cholecalciferol  (VITAMIN D3) 25 MCG (1000 UNIT) tablet Take 2,000 Units by mouth 2 (two) times daily.     clotrimazole  (LOTRIMIN ) 1 % cream Apply 1 Application topically 2 (two) times daily. To perineal area and abdominal folds     corticotropin  (ACTHAR ) 80 UNIT/ML injectable gel Inject 80 mg every other day for 10 days and repeat monthy 5 mL 5   diphenhydrAMINE  (BENADRYL ) 25 mg capsule Take 25 mg by mouth 3 (three) times  daily as needed (minor itching, irritation).     docusate sodium  (COLACE) 100 MG capsule Take 100 mg by mouth 2 (two) times daily.     Emollient (AQUAPHOR ADV PROTECT HEALING) 41 % OINT Apply 1 application  topically See admin instructions. Apply topically every shift. Apply to redness in crease of buttocks every shift with and with each toileting times.     Emollient (CERAVE DAILY MOISTURIZING) LOTN Apply 1 application  topically daily. To groin, inguinal area     famotidine  (PEPCID ) 20 MG tablet Take 20 mg by mouth 2 (two) times daily.     fluticasone  (FLONASE ) 50 MCG/ACT nasal spray Place 1 spray into both nostrils daily as needed (for nasal congestion).     gabapentin  (NEURONTIN ) 300 MG capsule Mrs. Stakes will take 300 mg of  gabapentin  at 6 AM, 10 AM, 2 PM and 6 PM and 600 mg at 10 PM by mouth. 180 capsule 11   hydrocortisone  (ANUSOL -HC) 25 MG suppository Place 1 suppository (25 mg total) rectally 2 (two) times daily. 14 suppository 4   lactulose  (CHRONULAC ) 10 GM/15ML solution Take 10 g by mouth 2 (two) times daily as needed.     levothyroxine  (SYNTHROID ) 75 MCG tablet Take 75 mcg by mouth daily before breakfast.     melatonin 5 MG TABS Take 5 mg by mouth at bedtime.     Menthol , Topical Analgesic, (BIOFREEZE COOL THE PAIN) 4 % GEL Apply 1 application  topically 3 (three) times daily. To left calf     metoprolol  tartrate (LOPRESSOR ) 50 MG tablet Take 0.5 tablets (25 mg total) by mouth daily. 30 tablet 0   midodrine  (PROAMATINE ) 5 MG tablet Take 1 tablet (5 mg total) by mouth 3 (three) times daily with meals.     ondansetron  (ZOFRAN -ODT) 4 MG disintegrating tablet Take 4 mg by mouth 4 (four) times daily as needed for nausea.     oxybutynin  (DITROPAN ) 5 MG tablet Take 5 mg by mouth 2 (two) times daily as needed (overactive bladder).     phenazopyridine  (PYRIDIUM ) 100 MG tablet Take 100 mg by mouth every 8 (eight) hours as needed (dysuria).     phenylephrine -shark liver oil-mineral oil-petrolatum (PREPARATION H) 0.25-14-74.9 % rectal ointment Place 1 Application rectally 3 (three) times daily as needed for hemorrhoids.     Polyethyl Glycol-Propyl Glycol (SYSTANE) 0.4-0.3 % SOLN Place 1 drop into both eyes in the morning and at bedtime.     polyethylene glycol (MIRALAX  / GLYCOLAX ) 17 g packet Take 17 g by mouth daily as needed for mild constipation, moderate constipation or severe constipation.     potassium chloride  SA (KLOR-CON ) 20 MEQ tablet Take 2 tablets (40 mEq total) by mouth daily. 180 tablet 3   promethazine  (PHENERGAN ) 12.5 MG tablet Take 12.5 mg by mouth every 8 (eight) hours as needed for nausea or vomiting.     Prucalopride Succinate  (MOTEGRITY ) 2 MG TABS Take 2 mg by mouth daily before breakfast.     Psyllium  (METAMUCIL SMOOTH TEXTURE PO) Take 1 packet by mouth 2 (two) times daily.     Salicylic Acid-Cleanser 6 % CREAM KIT Apply 1 each topically See admin instructions. Apply topically once a day on Monday, Wednesday, Saturday to lesion on foot 3x weekly. Once soft, gently file with disposible nail file weekly, repeat until resolved.     senna (SENOKOT) 8.6 MG TABS tablet Take 17.2 mg by mouth 2 (two) times daily.     silver  sulfADIAZINE (SILVADENE) 1 % cream  Apply 1 Application topically 2 (two) times daily.     sodium phosphate  (FLEET) 7-19 GM/118ML ENEM Place 1 enema rectally daily as needed (constipation).     Torsemide  40 MG TABS Take 40 mg by mouth in the morning and at bedtime.     triamcinolone cream (KENALOG) 0.1 % Apply 1 Application topically 2 (two) times daily.     cephALEXin  (KEFLEX ) 500 MG capsule Take 1 capsule (500 mg total) by mouth 2 (two) times daily. 20 capsule 0   torsemide  (DEMADEX ) 20 MG tablet Take 1 tablet (20 mg total) by mouth daily as needed (may take an additional 20 mg on the days of swelling or weight gain of 3 lb over night or 5 lb in 1 week.). 90 tablet 3   Current Facility-Administered Medications  Medication Dose Route Frequency Provider Last Rate Last Admin   baclofen  (GABLOFEN ) intrathecal injection 40000 mcg/20ml  80,000 mcg Intrathecal Once Sater, Charlie LABOR, MD         Review of Systems    She denies chest pain, palpitations, dyspnea, pnd, orthopnea, n, v, dizziness, syncope, or early satiety. All other systems reviewed and are otherwise negative except as noted above.   Physical Exam    VS:  BP (!) 104/48 (BP Location: Left Arm, Cuff Size: Large)   Pulse 60   Ht 5' 3 (1.6 m)   Wt 204 lb (92.5 kg)   SpO2 97%   BMI 36.14 kg/m   GEN: Well nourished, well developed, in no acute distress. HEENT: normal. Neck: Supple, no JVD, carotid bruits, or masses. Cardiac: IRIR, no murmurs, rubs, or gallops. No clubbing, cyanosis, nonpitting bilateral lower extremity  edema.  Radials/DP/PT 2+ and equal bilaterally.  Respiratory:  Respirations regular and unlabored, clear to auscultation bilaterally. GI: Soft, nontender, nondistended, BS + x 4. MS: no deformity or atrophy. Skin: warm and dry, no rash. Neuro:  Strength and sensation are intact. Psych: Normal affect.  Accessory Clinical Findings    ECG personally reviewed by me today - EKG Interpretation Date/Time:  Tuesday February 12 2024 15:26:51 EDT Ventricular Rate:  60 PR Interval:    QRS Duration:  104 QT Interval:  450 QTC Calculation: 450 R Axis:   65  Text Interpretation: Atrial fibrillation Low voltage QRS When compared with ECG of 31-Aug-2023 13:05, PREVIOUS ECG IS PRESENT Confirmed by Daneen Perkins ([id number]) on 02/12/2024 3:28:32 PM  - no acute changes.   Lab Results  Component Value Date   WBC 6.7 11/09/2023   HGB 14.3 11/09/2023   HCT 43.2 11/09/2023   MCV 93.9 11/09/2023   PLT 219 11/09/2023   Lab Results  Component Value Date   CREATININE 0.60 11/09/2023   BUN 10 11/09/2023   NA 134 (L) 11/09/2023   K 3.2 (L) 11/09/2023   CL 93 (L) 11/09/2023   CO2 30 11/09/2023   Lab Results  Component Value Date   ALT 47 (H) 11/09/2023   AST 27 11/09/2023   ALKPHOS 57 11/09/2023   BILITOT 0.8 11/09/2023   Lab Results  Component Value Date   CHOL 111 03/19/2019   HDL 57 03/19/2019   LDLCALC 43 03/19/2019   TRIG 53 03/19/2019   CHOLHDL 3.0 04/22/2010    Lab Results  Component Value Date   HGBA1C 4.9 06/05/2020    Assessment & Plan    1. Chronic combined systolic and diastolic heart failure/NICM: Most recent echo in 08/2023 showed EF 45%, mildly decreased LV function, LV global hypokinesis, indeterminate diastolic  parameters, normal RV systolic function, mild BAE, mild mitral valve regurgitation, no significant change from prior study. Escalation of GDMT has been limited in the setting of hypotension, requiring midodrine .  She reports a nearly 20 pound weight gain over the past 3  months. 3 weeks ago, the nurse practitioner at her long-term care facility increased her torsemide  to 40 mg twice daily.  She has noticed a slow decrease in her weight.  BMET was stable on 02/11/2024.  Will check BMP, CBC today.  Diuresis is unfortunately limited in the setting of hypotension.  We discussed low threshold for ED evaluation, need for IV Lasix  should she continue to have weight gain, hypotension, despite increased diuresis.  Continue metoprolol , torsemide .  2. Permanent atrial fibrillation: Rate controlled.  Amiodarone  was temporarily increased at her last visit in the setting of elevated heart rate.  It appears maintenance dose was never resumed and she has remained on amiodarone  200 mg twice daily.  Will decrease amiodarone  to 100 mg twice daily.  Chest x-ray in 08/2023 was stable (mild to moderate cardiomegaly, no acute lung process).  Will update CBC, fasting lipid panel, TSH.  Continue to monitor HR.  Continue metoprolol , Eliquis .  3. Hypertension/Hypotension: BP low in office today, mildly improved with recheck.  Asymptomatic.  Will decrease amiodarone  as above.  Consider decreasing metoprolol  if HR remains stable, and given need for additional diuresis. Continue current antihypertensive regimen, continue midodrine .  4. Hyperlipidemia: LDL was 92 in 12/2023.  She is not on lipid-lowering therapy at this time.  5. Chronic venous insufficiency: She has nonpitting bilateral lower extremity edema.  Continue compression, elevation.   6. Hypothyroidism: TSH was 1.494 in 05/2023.  Repeat TSH pending as above.  On levothyroxine .  7. OSA: Adherent to BiPAP, manage per neurology.   8. Disposition: Follow-up in 1 month, follow-up per recall with Dr. Mona in 05/2025.      Damien JAYSON Braver, NP 02/12/2024, 4:37 PM

## 2024-02-14 LAB — CBC
Hematocrit: 40.6 % (ref 34.0–46.6)
Hemoglobin: 13.5 g/dL (ref 11.1–15.9)
MCH: 33 pg (ref 26.6–33.0)
MCHC: 33.3 g/dL (ref 31.5–35.7)
MCV: 99 fL — ABNORMAL HIGH (ref 79–97)
Platelets: 170 10*3/uL (ref 150–450)
RBC: 4.09 x10E6/uL (ref 3.77–5.28)
RDW: 12.4 % (ref 11.7–15.4)
WBC: 7.6 10*3/uL (ref 3.4–10.8)

## 2024-02-14 LAB — TSH: TSH: 3.47 u[IU]/mL (ref 0.450–4.500)

## 2024-02-14 LAB — BRAIN NATRIURETIC PEPTIDE: BNP: 95.8 pg/mL (ref 0.0–100.0)

## 2024-02-14 LAB — HEPATIC FUNCTION PANEL
ALT: 10 IU/L (ref 0–32)
AST: 15 IU/L (ref 0–40)
Albumin: 3.8 g/dL (ref 3.8–4.8)
Alkaline Phosphatase: 70 IU/L (ref 44–121)
Bilirubin Total: 0.3 mg/dL (ref 0.0–1.2)
Bilirubin, Direct: 0.1 mg/dL (ref 0.00–0.40)
Total Protein: 6.1 g/dL (ref 6.0–8.5)

## 2024-02-18 ENCOUNTER — Ambulatory Visit: Payer: Self-pay | Admitting: Nurse Practitioner

## 2024-02-23 ENCOUNTER — Emergency Department (HOSPITAL_COMMUNITY)
Admission: EM | Admit: 2024-02-23 | Discharge: 2024-02-23 | Disposition: A | Discharge status: Home / Self Care | Attending: Emergency Medicine | Admitting: Emergency Medicine

## 2024-02-23 ENCOUNTER — Other Ambulatory Visit: Payer: Self-pay

## 2024-02-23 ENCOUNTER — Emergency Department (HOSPITAL_COMMUNITY)

## 2024-02-23 ENCOUNTER — Encounter (HOSPITAL_COMMUNITY): Payer: Self-pay

## 2024-02-23 DIAGNOSIS — R531 Weakness: Secondary | ICD-10-CM | POA: Insufficient documentation

## 2024-02-23 DIAGNOSIS — R200 Anesthesia of skin: Secondary | ICD-10-CM | POA: Insufficient documentation

## 2024-02-23 DIAGNOSIS — G35 Multiple sclerosis: Secondary | ICD-10-CM | POA: Insufficient documentation

## 2024-02-23 DIAGNOSIS — Z87891 Personal history of nicotine dependence: Secondary | ICD-10-CM | POA: Diagnosis not present

## 2024-02-23 DIAGNOSIS — H538 Other visual disturbances: Secondary | ICD-10-CM | POA: Insufficient documentation

## 2024-02-23 DIAGNOSIS — Z7901 Long term (current) use of anticoagulants: Secondary | ICD-10-CM | POA: Insufficient documentation

## 2024-02-23 DIAGNOSIS — R202 Paresthesia of skin: Secondary | ICD-10-CM | POA: Insufficient documentation

## 2024-02-23 LAB — BASIC METABOLIC PANEL WITH GFR
Anion gap: 7 (ref 5–15)
BUN: 26 mg/dL — ABNORMAL HIGH (ref 8–23)
CO2: 33 mmol/L — ABNORMAL HIGH (ref 22–32)
Calcium: 8.8 mg/dL — ABNORMAL LOW (ref 8.9–10.3)
Chloride: 97 mmol/L — ABNORMAL LOW (ref 98–111)
Creatinine, Ser: 0.78 mg/dL (ref 0.44–1.00)
GFR, Estimated: >60 mL/min (ref >60)
Glucose, Bld: 77 mg/dL (ref 70–99)
Potassium: 4.2 mmol/L (ref 3.5–5.1)
Sodium: 137 mmol/L (ref 135–145)

## 2024-02-23 LAB — URINALYSIS, ROUTINE W REFLEX MICROSCOPIC
Bilirubin Urine: NEGATIVE
Glucose, UA: NEGATIVE mg/dL
Hgb urine dipstick: NEGATIVE
Ketones, ur: NEGATIVE mg/dL
Nitrite: NEGATIVE
Protein, ur: NEGATIVE mg/dL
Specific Gravity, Urine: 1.005 (ref 1.005–1.030)
pH: 7 (ref 5.0–8.0)

## 2024-02-23 LAB — CBC WITH DIFFERENTIAL/PLATELET
Abs Immature Granulocytes: 0.01 K/uL (ref 0.00–0.07)
Basophils Absolute: 0 K/uL (ref 0.0–0.1)
Basophils Relative: 1 %
Eosinophils Absolute: 0.2 K/uL (ref 0.0–0.5)
Eosinophils Relative: 4 %
HCT: 40.5 % (ref 36.0–46.0)
Hemoglobin: 13.2 g/dL (ref 12.0–15.0)
Immature Granulocytes: 0 %
Lymphocytes Relative: 33 %
Lymphs Abs: 1.7 K/uL (ref 0.7–4.0)
MCH: 32.4 pg (ref 26.0–34.0)
MCHC: 32.6 g/dL (ref 30.0–36.0)
MCV: 99.5 fL (ref 80.0–100.0)
Monocytes Absolute: 0.8 K/uL (ref 0.1–1.0)
Monocytes Relative: 15 %
Neutro Abs: 2.5 K/uL (ref 1.7–7.7)
Neutrophils Relative %: 47 %
Platelets: 171 K/uL (ref 150–400)
RBC: 4.07 MIL/uL (ref 3.87–5.11)
RDW: 13 % (ref 11.5–15.5)
WBC: 5.2 K/uL (ref 4.0–10.5)
nRBC: 0 % (ref 0.0–0.2)

## 2024-02-23 MED ORDER — GADOBUTROL 1 MMOL/ML IV SOLN
9.0000 mL | Freq: Once | INTRAVENOUS | Status: AC | PRN
  Administered 2024-02-23: 9 mL via INTRAVENOUS

## 2024-02-23 MED ORDER — GABAPENTIN 300 MG PO CAPS
300.0000 mg | ORAL_CAPSULE | Freq: Once | ORAL | Status: AC
  Administered 2024-02-23: 300 mg via ORAL
  Filled 2024-02-23: qty 1

## 2024-02-23 NOTE — ED Notes (Signed)
 Pt transported to MRI

## 2024-02-23 NOTE — ED Provider Notes (Signed)
 Accepted handoff at shift change from [facility]. Please see prior provider note for more detail.   Briefly: Patient is 78 y.o.   DDX: concern for Hx of MS, coming in with left sided numbness and tingling. Usually on right more than left for her. Normal at sleep, woke up around midnight with weakness. Vanessa unsure whether this would be MS -- if new lesion treat with steroids, otherwise may be small vessel stroke, unclear. Didn't think we needed any additional levels on MR.  Plan: I independently interpreted imaging including mr brain w wo contrast which shows  1. 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 No  active demyelination identified.  2. No new intracranial abnormality.  3. Chronic cervical ACDF, cervical spine degeneration.  . I agree with the radiologist interpretation.   UA shows large leukocytes, rare bacteria, not overly suspicious for worsening infection, she may have some ongoing bacteriuria in context of her known chronic Foley catheter does have known chronic UTI noted, history of foley.   Overall no objective weakness noted on reevaluation, I think that patient is stable for discharge with close neurology follow-up.   Rosan Sherlean DEL, [place] 02/23/24 [id number]    Bari Charmaine FALCON, MD 02/24/24 MONIQUE

## 2024-02-23 NOTE — ED Notes (Signed)
Attempted IV x 2 with no success. 

## 2024-02-23 NOTE — ED Notes (Signed)
 Called [facility]

## 2024-02-23 NOTE — ED Notes (Signed)
 Advised MRI of the IV that Is now in place. They stated they will call for transport.

## 2024-02-23 NOTE — ED Notes (Addendum)
 [employer] will need to be called and advised patient has had an MRI completed with her Baclofen  pump in place. Phone number is [phone] hours are 8 am-8 pm.

## 2024-02-23 NOTE — ED Notes (Signed)
 IV nurse will collect labs.  KM

## 2024-02-23 NOTE — ED Triage Notes (Signed)
 Pt BIB EMS from [facility]. C/o MS flare up,  numbness and Tingling in both hands and legs. This sensation in legs  began started about 6 hrs ago.  EMS reports pt is taking antibiotics for Uti treatment   EMS VS 100/58, HR 68, R 16, 97% 2L, 94% RA, cbg 119

## 2024-02-23 NOTE — Progress Notes (Incomplete)
 Patient's MRI is complete and her [employer] Synchromed II should be checked after at least 20 minutes have passed from the end of the exam to confirm that the motor has recovered from stall per manufacturer guidelines.

## 2024-02-23 NOTE — Discharge Instructions (Addendum)
 Your workup today did not show any evidence of new stroke, or acute MS flare, I recommend that you follow-up with your neurologist if you continue to have any tingling.  Please return to the emergency department if you have significant new weakness, face droop, vision changes, confusion.  Finish taking your antibiotics for the urinary tract infection previously diagnosed.

## 2024-02-23 NOTE — ED Provider Notes (Signed)
 [place] EMERGENCY DEPARTMENT AT [facility] Provider Note   CSN: [id number] Arrival date & time: 02/23/24  9667     Patient presents with: Weakness   Toni [name] is a 78 y.o. female.  Patient with past medical history significant for primary chronic progressive MS, permanent A-fib on Eliquis , ACTH  dependent Cushing's syndrome presents the emergency department via EMS from [facility] complaining of a possible MS flareup.  The patient states that she has numbness tingling which is worse from baseline on the left side.  She states she always has some of the symptoms on the right side but does not normally have the symptoms on the left side.  The patient also complained of some tingling of the left side of her face which is also new.  She states that she went to bed at approximate 9 PM and took medication at 10 PM.  This is the last time she felt normal before going to sleep.  She woke up at 1230 with the symptoms.  She does endorse a current urinary tract infection and is currently taking antibiotics for the same.  She states she had a brief episode of blurry vision yesterday.  She denies chest pain, shortness of breath, abdominal pain.   HPI     Prior to Admission medications   Medication Sig Start Date End Date Taking? Authorizing Provider  acetaminophen  (TYLENOL ) 500 MG tablet Take 1,000 mg by mouth every 8 (eight) hours as needed for fever or headache (pain).    [provider]  ALPRAZolam  (XANAX ) 0.5 MG tablet Take 1 tablet (0.5 mg total) by mouth at bedtime. May also take 1 tablet every 12 hours as needed anxiety 07/17/22   Ghimire, Donalda HERO, MD  amiodarone  (PACERONE ) 200 MG tablet Take 1 tablet (200 mg total) by mouth 2 (two) times daily for 14 days, THEN 1 tablet (200 mg total) daily. Follow up with cardiology outpatient for refills and continued recommendations. 08/20/23 02/12/24  Perri DELENA Meliton Mickey., MD  amiodarone  (PACERONE ) 200 MG tablet Take 200 mg  by mouth every evening. Patient taking differently: Take 100 mg by mouth 2 (two) times daily.    [provider]  apixaban  (ELIQUIS ) 5 MG TABS tablet Take 5 mg by mouth 2 (two) times daily.    [provider]  baclofen  (LIORESAL ) 20 MG tablet Take 20 mg by mouth daily as needed for muscle spasms.    [provider]  Biotin  10000 MCG TABS Take 1 tablet by mouth daily.    [provider]  bisacodyl  (DULCOLAX) 10 MG suppository Place 10 mg rectally See admin instructions. Insert 10 mg (1 suppository) rectally once daily on Sunday, Tuesday, Thursday. May use an additional suppository once daily as needed for constipation.    [provider]  buPROPion  (WELLBUTRIN  XL) 150 MG 24 hr tablet Take 1 tablet (150 mg total) by mouth daily. 08/20/23   Perri DELENA Meliton Mickey., MD  cephALEXin  (KEFLEX ) 500 MG capsule Take 1 capsule (500 mg total) by mouth 2 (two) times daily. 11/10/23   Jarold Olam HERO, PA-C  cetirizine  (ZYRTEC ) 10 MG tablet Take 10 mg by mouth daily as needed for allergies.    [provider]  cholecalciferol  (VITAMIN D3) 25 MCG (1000 UNIT) tablet Take 2,000 Units by mouth 2 (two) times daily.    [provider]  clotrimazole  (LOTRIMIN ) 1 % cream Apply 1 Application topically 2 (two) times daily. To perineal area and abdominal folds  [provider]  corticotropin  (ACTHAR ) 80 UNIT/ML injectable gel Inject 80 mg every other day for 10 days and repeat monthy 11/14/23   Dohmeier, Dedra, MD  diphenhydrAMINE  (BENADRYL ) 25 mg capsule Take 25 mg by mouth 3 (three) times daily as needed (minor itching, irritation).    [provider]  docusate sodium  (COLACE) 100 MG capsule Take 100 mg by mouth 2 (two) times daily.    [provider]  Emollient (AQUAPHOR ADV PROTECT HEALING) 41 % OINT Apply 1 application  topically See admin instructions. Apply topically every shift. Apply to redness in crease of buttocks every shift  with and with each toileting times.    [provider]  Emollient (CERAVE DAILY MOISTURIZING) LOTN Apply 1 application  topically daily. To groin, inguinal area    [provider]  famotidine  (PEPCID ) 20 MG tablet Take 20 mg by mouth 2 (two) times daily.    [provider]  fluticasone  (FLONASE ) 50 MCG/ACT nasal spray Place 1 spray into both nostrils daily as needed (for nasal congestion).    [provider]  gabapentin  (NEURONTIN ) 300 MG capsule Mrs. Parfait will take 300 mg of gabapentin  at 6 AM, 10 AM, 2 PM and 6 PM and 600 mg at 10 PM by mouth. 03/07/21   Dohmeier, Dedra, MD  hydrocortisone  (ANUSOL -HC) 25 MG suppository Place 1 suppository (25 mg total) rectally 2 (two) times daily. 09/21/23   McMichael, Nestor HERO, PA-C  lactulose  (CHRONULAC ) 10 GM/15ML solution Take 10 g by mouth 2 (two) times daily as needed. 02/05/24   [provider]  levothyroxine  (SYNTHROID ) 75 MCG tablet Take 75 mcg by mouth daily before breakfast.    [provider]  melatonin 5 MG TABS Take 5 mg by mouth at bedtime.    [provider]  Menthol , Topical Analgesic, (BIOFREEZE COOL THE PAIN) 4 % GEL Apply 1 application  topically 3 (three) times daily. To left calf    [provider]  metoprolol  tartrate (LOPRESSOR ) 50 MG tablet Take 0.5 tablets (25 mg total) by mouth daily. 08/31/23   Randol Simmonds, MD  midodrine  (PROAMATINE ) 5 MG tablet Take 1 tablet (5 mg total) by mouth 3 (three) times daily with meals. 08/20/23   Perri DELENA Meliton Mickey., MD  ondansetron  (ZOFRAN -ODT) 4 MG disintegrating tablet Take 4 mg by mouth 4 (four) times daily as needed for nausea.    [provider]  oxybutynin  (DITROPAN ) 5 MG tablet Take 5 mg by mouth 2 (two) times daily as needed (overactive bladder).    [provider]  phenazopyridine  (PYRIDIUM ) 100 MG tablet Take 100 mg by mouth every 8 (eight) hours as needed (dysuria).    [provider]   phenylephrine -shark liver oil-mineral oil-petrolatum (PREPARATION H) 0.25-14-74.9 % rectal ointment Place 1 Application rectally 3 (three) times daily as needed for hemorrhoids.    [provider]  Polyethyl Glycol-Propyl Glycol (SYSTANE) 0.4-0.3 % SOLN Place 1 drop into both eyes in the morning and at bedtime.    [provider]  polyethylene glycol (MIRALAX  / GLYCOLAX ) 17 g packet Take 17 g by mouth daily as needed for mild constipation, moderate constipation or severe constipation.    [provider]  potassium chloride  SA (KLOR-CON ) 20 MEQ tablet Take 2 tablets (40 mEq total) by mouth daily. 01/03/21   Hilty, Vinie BROCKS, MD  promethazine  (PHENERGAN ) 12.5 MG tablet Take 12.5 mg by mouth every 8 (eight) hours as needed for nausea or vomiting.    [provider]  Prucalopride Succinate  (MOTEGRITY ) 2 MG TABS Take 2 mg by mouth daily before breakfast.    [provider]  Psyllium (METAMUCIL SMOOTH TEXTURE PO) Take 1 packet by mouth 2 (two) times daily.    [provider]  Salicylic Acid-Cleanser 6 % CREAM KIT Apply 1 each topically See admin instructions. Apply topically once a day on Monday, Wednesday, Saturday to lesion on foot 3x weekly. Once soft, gently file with disposible nail file weekly, repeat until resolved.    [provider]  senna (SENOKOT) 8.6 MG TABS tablet Take 17.2 mg by mouth 2 (two) times daily.    [provider]  silver  sulfADIAZINE (SILVADENE) 1 % cream Apply 1 Application topically 2 (two) times daily. 08/23/23   [provider]  sodium phosphate  (FLEET) 7-19 GM/118ML ENEM Place 1 enema rectally daily as needed (constipation).    [provider]  torsemide  (DEMADEX ) 20 MG tablet Take 1 tablet (20 mg total) by mouth daily as needed (may take an additional 20 mg on the days of swelling or weight gain of 3 lb over night or 5 lb in 1 week.). 02/12/24   Daneen Damien BROCKS, NP  Torsemide  40 MG TABS Take 40  mg by mouth in the morning and at bedtime.    [provider]  triamcinolone cream (KENALOG) 0.1 % Apply 1 Application topically 2 (two) times daily.    [provider]    Allergies: Other, Hydrocodone , Oxycontin  [oxycodone ], Zoloft [sertraline], Chloraseptic [phenol], Macrobid  [nitrofurantoin ], Vioxx [rofecoxib], and Hibiclens  [chlorhexidine  gluconate]    Review of Systems  Updated Vital Signs BP (!) 107/34   Pulse 62   Temp 97.6 F (36.4 C) (Oral)   Ht 5' 3 (1.6 m)   Wt 92.5 kg   SpO2 100%   BMI 36.14 kg/m   Physical Exam Vitals and nursing note reviewed.  Constitutional:      General: She is not in acute distress.    Appearance: She is well-developed.  HENT:     Head: Normocephalic and atraumatic.  Eyes:     Conjunctiva/sclera: Conjunctivae normal.  Cardiovascular:     Rate and Rhythm: Normal rate and regular rhythm.     Heart sounds: No murmur heard. Pulmonary:     Effort: Pulmonary effort is normal. No respiratory distress.     Breath sounds: Normal breath sounds.  Abdominal:     Palpations: Abdomen is soft.     Tenderness: There is no abdominal tenderness.  Musculoskeletal:        General: No swelling.     Cervical back: Neck supple.  Skin:    General: Skin is warm and dry.     Capillary Refill: Capillary refill takes less than 2 seconds.  Neurological:     Mental Status: She is alert.     Comments: Decreased sensation on left side of face, left arm, left leg when compared to contralateral side.  Mildly diminished strength in left leg and left arm when compared to contralateral side.  No facial droop, no slurred speech.  Psychiatric:        Mood and Affect: Mood normal.     (all labs ordered are listed, but only abnormal results are displayed) Labs Reviewed  BASIC METABOLIC PANEL WITH GFR - Abnormal; Notable for the following components:      Result Value   Chloride 97 (*)    CO2 33 (*)    BUN 26 (*)    Calcium  8.8 (*)  All other  components within normal limits  CBC WITH DIFFERENTIAL/PLATELET  URINALYSIS, ROUTINE W REFLEX MICROSCOPIC    EKG: None  Radiology: DG Chest Port 1 View Result Date: 02/23/2024 CLINICAL DATA:  78 year old female with numbness and tingling in the bilateral extremities, multiple sclerosis flare, query infection EXAM: PORTABLE CHEST 1 VIEW COMPARISON:  Portable chest 08/31/2023 and earlier. FINDINGS: Portable AP semi upright view at 0405 hours. Stable cardiomegaly and mediastinal contours. Lung volumes are stable, within normal limits. Small nodular density at the right lateral lung base is unchanged from a 2005 radiograph (11/04/2003), and otherwise when allowing for portable technique the lungs are clear. No pneumothorax or pleural effusion. No acute osseous abnormality identified. IMPRESSION: Stable cardiomegaly. No acute cardiopulmonary abnormality. Electronically Signed   By: VEAR Hurst M.D.   On: 02/23/2024 04:14     Procedures   Medications Ordered in the ED  gadobutrol  (GADAVIST ) 1 MMOL/ML injection 9 mL (has no administration in time range)    Clinical Course as of 02/23/24 0636  Sat Feb 23, 2024  0632 Hx of MS, coming in with left sided numbness and tingling. Usually on right more than left for her. Normal at sleep, woke up around midnight with weakness. Vanessa unsure whether this would be MS -- if new lesion treat with steroids, otherwise may be small vessel stroke, unclear. Didn't think we needed any additional levels on MR. [CP]    Clinical Course User Index [CP] Rosan Sherlean VEAR, PA-C                                 Medical Decision Making Amount and/or Complexity of Data Reviewed Labs: ordered. Radiology: ordered.   This patient presents to the ED for concern of left-sided numbness, paresthesias, weakness, this involves an extensive number of treatment options, and is a complaint that carries with it a high risk of complications and morbidity.  The differential  diagnosis includes CVA, MS exacerbation, others   Co morbidities / Chronic conditions that complicate the patient evaluation  MS, atrial fibrillation on Eliquis    Additional history obtained:  Additional history obtained from EMR External records from outside source obtained and reviewed including cardiology notes   Lab Tests:  I Ordered, and personally interpreted labs.  The pertinent results include: Grossly unremarkable BMP, CBC   Imaging Studies ordered:  I ordered imaging studies including chest x-ray, MRI brain with and without contrast I independently visualized and interpreted imaging which showed stable cardiomegaly, MR pending I agree with the radiologist interpretation    Consultations Obtained:  I discussed the case with neurology who recommended an MRI of the brain with and without contrast.  If patient has enhancing lesion will likely need steroids.  Concern for possible stroke.  Based on patient's current Eliquis  usage, patient is likely medically managed for stroke already.   Social Determinants of Health:  Patient is a former smoker   Test / Admission - Considered:  Concern for possible small vessel CVA versus MS flare at this time.  MRI pending for further evaluation. Patient care being signed out to Sherlean Rosan, PA-C at shift handoff. Disposition pending results of MRI.  If MRI shows signs of MS flare patient would likely benefit from steroid administration versus possible further stroke workup/admission.      Final diagnoses:  None    ED Discharge Orders     None  Logan Ubaldo NOVAK, PA-C 02/23/24 9363    Bari Charmaine FALCON, MD 02/24/24 [phone]

## 2024-02-25 ENCOUNTER — Ambulatory Visit (INDEPENDENT_AMBULATORY_CARE_PROVIDER_SITE_OTHER): Admitting: Neurology

## 2024-02-25 ENCOUNTER — Encounter: Payer: Self-pay | Admitting: Neurology

## 2024-02-25 ENCOUNTER — Telehealth: Payer: Self-pay | Admitting: Neurology

## 2024-02-25 VITALS — BP 115/75 | HR 76 | Ht 63.0 in | Wt 205.0 lb

## 2024-02-25 DIAGNOSIS — R252 Cramp and spasm: Secondary | ICD-10-CM | POA: Diagnosis not present

## 2024-02-25 DIAGNOSIS — G8250 Quadriplegia, unspecified: Secondary | ICD-10-CM

## 2024-02-25 DIAGNOSIS — G35 Multiple sclerosis: Secondary | ICD-10-CM | POA: Diagnosis not present

## 2024-02-25 DIAGNOSIS — M62838 Other muscle spasm: Secondary | ICD-10-CM

## 2024-02-25 MED ORDER — BACLOFEN 40000 MCG/20ML IT SOLN: 80000.0000 ug | Freq: Once | INTRATHECAL | Status: AC

## 2024-02-25 NOTE — Telephone Encounter (Signed)
 POD 3- FYI since she is followed by Dr. Chalice. I called pt and this has been handled.  Called pt at [phone].  Dr. Chalice primary MD. Dr. Vear only refills pt baclofen  pump. She went to [facility] 02/23/24. Per notes, did not feel she had CVA or acute MS flare.   I called pt. Pt reports she has had chronic UTI's for months now. Educated pt that any infection/infection can make MS sx feel worse until cleared. She lives at nursing home. Finished oral antibiotic yesterday. She will speak with nursing home to see if they can recheck UA/culture to make sure infection has cleared. If not, discuss if she needs more antibiotics.   Reminded her about baclofen  pump refill this afternoon. She confirmed she will be here.

## 2024-02-25 NOTE — Progress Notes (Addendum)
 [facility]  PATIENT: Toni [name] DOB: 07-23-1946  REFERRING DOCTOR OR PCP: Dr. Chalice SOURCE: Patient, notes from Dr. Chalice and Dr. Jenel  _________________________________   HISTORICAL  CHIEF COMPLAINT:  Chief Complaint  Patient presents with   Follow-up    Pt in room 10.husband in room. Here for baclofen  pump refill.     HISTORY OF PRESENT ILLNESS:  Toni [name] is a 78 year old woman with multiple sclerosis.  Toni [name]    UPDATE 02/25/2024.   She was hospitalized last weekend due to a urinary tract infection.  She has had several of these.  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  Spasticity and cognition are worse when she has a UTI.  She feels that the baclofen  pump continues to help her spasticity significantly. She is not experiencing any significant spasming during the day or night.  Pain is well-controlled.. She notes that she had been on ACTHar  in the past and felt better when she was on it.  She has tolerated that better than she did the prednisone .  She requires refills every 2 to 3 months.   She had her pump replaced in 2022 (Dr. Unice) and it should not need to be replaced until 2029.     She is in [facility] in [place].   Current pump is 40 cc.  She is on third pump - first one 2009, last one July 2022).  Since her spasticity is stable we will keep the dose the same.  She only rarely takes an oral baclofen . She is unable to walk but can assist some with transfers.  She uses a standing frame.  She is able to move her legs in bed and to go on her side in bed.  She also notes weakness in her hands.  She has dysesthesias in her abdomen and legs.  Gabapentin  is helping some.Toni [name]        REVIEW OF SYSTEMS: Constitutional: No fevers, chills, sweats, or change in appetite Eyes: No visual changes, double vision, eye pain Ear, nose and throat: No hearing loss, ear pain, nasal congestion, sore  throat Cardiovascular: No chest pain, palpitations Respiratory:  No shortness of breath at rest or with exertion.   No wheezes GastrointestinaI: No nausea, vomiting, diarrhea, abdominal pain, fecal incontinence Genitourinary:  No dysuria, urinary retention or frequency.  No nocturia. Musculoskeletal:  No neck pain, back pain Integumentary: No rash, pruritus, skin lesions Neurological: as above Psychiatric: No depression at this time.  No anxiety Endocrine: No palpitations, diaphoresis, change in appetite, change in weigh or increased thirst Hematologic/Lymphatic:  No anemia, purpura, petechiae. Allergic/Immunologic: No itchy/runny eyes, nasal congestion, recent allergic reactions, rashes  ALLERGIES: Allergies  Allergen Reactions   Other Hives and Rash    Topical Surgical prep  - severe rash, hives   Hydrocodone  Nausea And Vomiting   Oxycontin  [Oxycodone ] Nausea And Vomiting   Zoloft [Sertraline] Hives, Swelling and Rash   Chloraseptic [Phenol] Hives   Macrobid  [Nitrofurantoin ] Nausea And Vomiting    Not documented on MAR   Vioxx [Rofecoxib] Other (See Comments)    Unknown reaction Can take NSAIDS   Hibiclens  [Chlorhexidine  Gluconate] Hives, Rash and Other (See Comments)    Sores    HOME MEDICATIONS:  Current Outpatient Medications:    acetaminophen  (TYLENOL ) 500 MG tablet, Take 1,000 mg by mouth every 8 (eight) hours as needed for fever or headache (pain)., Disp: , Rfl:    ALPRAZolam  (XANAX ) 0.5  MG tablet, Take 1 tablet (0.5 mg total) by mouth at bedtime. May also take 1 tablet every 12 hours as needed anxiety, Disp: 10 tablet, Rfl: 0   amiodarone  (PACERONE ) 100 MG tablet, Take 100 mg by mouth 2 (two) times daily., Disp: , Rfl:    apixaban  (ELIQUIS ) 5 MG TABS tablet, Take 5 mg by mouth 2 (two) times daily., Disp: , Rfl:    baclofen  (LIORESAL ) 20 MG tablet, Take 20 mg by mouth daily as needed for muscle spasms., Disp: , Rfl:    Biotin  10000 MCG TABS, Take 1 tablet by mouth  daily., Disp: , Rfl:    bisacodyl  (DULCOLAX) 10 MG suppository, Place 10 mg rectally See admin instructions. Insert 10 mg (1 suppository) rectally once daily on Sunday, Tuesday, Thursday. May use an additional suppository once daily as needed for constipation., Disp: , Rfl:    buPROPion  (WELLBUTRIN  XL) 150 MG 24 hr tablet, Take 1 tablet (150 mg total) by mouth daily., Disp: , Rfl:    cetirizine  (ZYRTEC ) 10 MG tablet, Take 10 mg by mouth daily as needed for allergies., Disp: , Rfl:    cholecalciferol  (VITAMIN D3) 25 MCG (1000 UNIT) tablet, Take 2,000 Units by mouth 2 (two) times daily., Disp: , Rfl:    clotrimazole  (LOTRIMIN ) 1 % cream, Apply 1 Application topically 2 (two) times daily. To perineal area and abdominal folds, Disp: , Rfl:    docusate sodium  (COLACE) 100 MG capsule, Take 100 mg by mouth 2 (two) times daily., Disp: , Rfl:    Emollient (AQUAPHOR ADV PROTECT HEALING) 41 % OINT, Apply 1 application  topically See admin instructions. Apply topically every shift. Apply to redness in crease of buttocks every shift with and with each toileting times., Disp: , Rfl:    Emollient (CERAVE DAILY MOISTURIZING) LOTN, Apply 1 application  topically daily. To groin, inguinal area, Disp: , Rfl:    famotidine  (PEPCID ) 20 MG tablet, Take 20 mg by mouth 2 (two) times daily., Disp: , Rfl:    fluticasone  (FLONASE ) 50 MCG/ACT nasal spray, Place 1 spray into both nostrils daily as needed (for nasal congestion)., Disp: , Rfl:    gabapentin  (NEURONTIN ) 300 MG capsule, Toni [name] will take 300 mg of gabapentin  at 6 AM, 10 AM, 2 PM and 6 PM and 600 mg at 10 PM by mouth., Disp: 180 capsule, Rfl: 11   hydrocortisone  (ANUSOL -HC) 25 MG suppository, Place 1 suppository (25 mg total) rectally 2 (two) times daily., Disp: 14 suppository, Rfl: 4   lactulose  (CHRONULAC ) 10 GM/15ML solution, Take 10 g by mouth 2 (two) times daily as needed., Disp: , Rfl:    levothyroxine  (SYNTHROID ) 75 MCG tablet, Take 75 mcg by mouth daily  before breakfast., Disp: , Rfl:    melatonin 5 MG TABS, Take 5 mg by mouth at bedtime., Disp: , Rfl:    Menthol , Topical Analgesic, (BIOFREEZE COOL THE PAIN) 4 % GEL, Apply 1 application  topically 3 (three) times daily. To left calf, Disp: , Rfl:    metoprolol  tartrate (LOPRESSOR ) 50 MG tablet, Take 0.5 tablets (25 mg total) by mouth daily., Disp: 30 tablet, Rfl: 0   midodrine  (PROAMATINE ) 5 MG tablet, Take 1 tablet (5 mg total) by mouth 3 (three) times daily with meals., Disp: , Rfl:    ondansetron  (ZOFRAN -ODT) 4 MG disintegrating tablet, Take 4 mg by mouth 4 (four) times daily as needed for nausea., Disp: , Rfl:    oxybutynin  (DITROPAN ) 5 MG tablet, Take 5 mg by mouth  2 (two) times daily as needed (overactive bladder)., Disp: , Rfl:    phenazopyridine  (PYRIDIUM ) 100 MG tablet, Take 100 mg by mouth every 8 (eight) hours as needed (dysuria)., Disp: , Rfl:    phenylephrine -shark liver oil-mineral oil-petrolatum (PREPARATION H) 0.25-14-74.9 % rectal ointment, Place 1 Application rectally 3 (three) times daily as needed for hemorrhoids., Disp: , Rfl:    Polyethyl Glycol-Propyl Glycol (SYSTANE) 0.4-0.3 % SOLN, Place 1 drop into both eyes in the morning and at bedtime., Disp: , Rfl:    polyethylene glycol (MIRALAX  / GLYCOLAX ) 17 g packet, Take 17 g by mouth daily as needed for mild constipation, moderate constipation or severe constipation., Disp: , Rfl:    potassium chloride  SA (KLOR-CON ) 20 MEQ tablet, Take 2 tablets (40 mEq total) by mouth daily., Disp: 180 tablet, Rfl: 3   promethazine  (PHENERGAN ) 12.5 MG tablet, Take 12.5 mg by mouth every 8 (eight) hours as needed for nausea or vomiting., Disp: , Rfl:    Prucalopride Succinate  (MOTEGRITY ) 2 MG TABS, Take 2 mg by mouth daily before breakfast., Disp: , Rfl:    Psyllium (METAMUCIL SMOOTH TEXTURE PO), Take 1 packet by mouth 2 (two) times daily., Disp: , Rfl:    Salicylic Acid-Cleanser 6 % CREAM KIT, Apply 1 each topically See admin instructions. Apply  topically once a day on Monday, Wednesday, Saturday to lesion on foot 3x weekly. Once soft, gently file with disposible nail file weekly, repeat until resolved., Disp: , Rfl:    senna (SENOKOT) 8.6 MG TABS tablet, Take 17.2 mg by mouth 2 (two) times daily., Disp: , Rfl:    silver  sulfADIAZINE (SILVADENE) 1 % cream, Apply 1 Application topically 2 (two) times daily., Disp: , Rfl:    sodium phosphate  (FLEET) 7-19 GM/118ML ENEM, Place 1 enema rectally daily as needed (constipation)., Disp: , Rfl:    torsemide  (DEMADEX ) 20 MG tablet, Take 1 tablet (20 mg total) by mouth daily as needed (may take an additional 20 mg on the days of swelling or weight gain of 3 lb over night or 5 lb in 1 week.)., Disp: 90 tablet, Rfl: 3   Torsemide  40 MG TABS, Take 40 mg by mouth in the morning and at bedtime., Disp: , Rfl:    triamcinolone cream (KENALOG) 0.1 %, Apply 1 Application topically 2 (two) times daily., Disp: , Rfl:    cephALEXin  (KEFLEX ) 500 MG capsule, Take 1 capsule (500 mg total) by mouth 2 (two) times daily. (Patient not taking: Reported on 02/25/2024), Disp: 20 capsule, Rfl: 0   corticotropin  (ACTHAR ) 80 UNIT/ML injectable gel, Inject 80 mg every other day for 10 days and repeat monthy (Patient not taking: Reported on 02/25/2024), Disp: 5 mL, Rfl: 5   diphenhydrAMINE  (BENADRYL ) 25 mg capsule, Take 25 mg by mouth 3 (three) times daily as needed (minor itching, irritation). (Patient not taking: Reported on 02/25/2024), Disp: , Rfl:   Current Facility-Administered Medications:    baclofen  (GABLOFEN ) intrathecal injection 40000 mcg/20ml, 80,000 mcg, Intrathecal, Once, Toni [name], Toni LABOR, MD   baclofen  (GABLOFEN ) intrathecal injection 40000 mcg/20ml, 80,000 mcg, Intrathecal, Once, Toni [name], Toni LABOR, MD  PAST MEDICAL HISTORY: Past Medical History:  Diagnosis Date   Abnormality of gait 03/01/2015   Acute metabolic encephalopathy 06/04/2020   Anxiety    Arthritis    knees (01/19/2016)   Asthma    as a child     Atrial fibrillation (HCC)    Back pain    Bilateral carpal tunnel syndrome 10/04/2020   Cancer (HCC)    /  RENAL CELL CARCINOMA - GETS CT SCAN EVERY 6 MONTHS TO WATCH    CHF (congestive heart failure) (HCC)    Chronic pain    nerve pain from the MS   Complex atypical endometrial hyperplasia    Constipation    COVID-19 long hauler manifesting chronic dyspnea 09/13/2020   COVID-19 virus infection 08/23/2019   Depression    DVT (deep venous thrombosis) (HCC) 1983   RLE; may have just been phlebitis; it was before the age of dopplers   Dysrhythmia    afib    Edema    varicose veins with severe venous insuff in R and L GSV' ablation of R GSV 2012   GERD (gastroesophageal reflux disease)    HLD (hyperlipidemia)    Hypertension    Hypotension    Hypothyroidism    Joint pain    Multiple sclerosis (HCC)    has had this may 1989-[facility] reg doc   OSA on CPAP    bipap machine - SLEEP APNEA WORSE SINCE COVID IN 1/21 PER PATIENT SETTING HAS INCREASED FROM 9 TO 30    Osteoarthritis    PAF (paroxysmal atrial fibrillation) (HCC)    on coumadin ; documented on monitor 06/2010 - no longer takes per [facility] 02/09/21   Pneumonia 11/2011   Recurrent UTI 04/07/2020   Sepsis (HCC) 06/04/2020   Sepsis secondary to UTI (HCC) 11/20/2022   Sleep apnea    uses sleep apnea machine    PAST SURGICAL HISTORY: Past Surgical History:  Procedure Laterality Date   ANTERIOR CERVICAL DECOMP/DISCECTOMY FUSION  01/2004   CARDIAC CATHETERIZATION  06/22/2010   normal coronary arteries, PAF   CATARACT EXTRACTION, BILATERAL Bilateral    CHILD BIRTHS     X2   DILATATION & CURETTAGE/HYSTEROSCOPY WITH MYOSURE N/A 01/15/2020   Procedure: DILATATION & CURETTAGE/HYSTEROSCOPY WITH MYOSURE;  Surgeon: Leva Rush, MD;  Location: [facility];  Service: Gynecology;  Laterality: N/A;  pt in wheelchair-has MS   DILATION AND CURETTAGE OF UTERUS     DILATION AND CURETTAGE OF UTERUS N/A 02/16/2020   Procedure: DILATATION  AND CURETTAGE;  Surgeon: Viktoria Comer SAUNDERS, MD;  Location: [facility];  Service: Gynecology;  Laterality: N/A;  NOT GENERAL -NO INTUBATION   DNC     INFUSION PUMP IMPLANTATION  ~ 2009   baclofen  infusion in lower abd   INTRATHECAL PUMP REVISION N/A 02/18/2021   Procedure: Baclofen  Pump replacement with possible revision of catheter;  Surgeon: Unice Pac, MD;  [facility];  Service: Neurosurgery;  Laterality: N/A;  3C/RM 20   INTRAUTERINE DEVICE (IUD) INSERTION N/A 02/16/2020   Procedure: INTRAUTERINE DEVICE (IUD) INSERTION - MIRENA ;  Surgeon: Viktoria Comer SAUNDERS, MD;  Location: [facility];  Service: Gynecology;  Laterality: N/A;   IUD put in uterus     PAIN PUMP REVISION N/A 07/29/2014   Procedure: Baclofen  pump replacement;  Surgeon: Pac Unice, MD;  Location: [facility];  Service: Neurosurgery;  Laterality: N/A;  Baclofen  pump replacement   TONSILLECTOMY AND ADENOIDECTOMY     VARICOSE VEIN SURGERY  ~ 1968   VENOUS ABLATION  12/16/2010   radiofreq ablation -Dr Leafy and [facility]   WISDOM TOOTH EXTRACTION      FAMILY HISTORY: Family History  Problem Relation Age of Onset   Depression Mother    Sudden death Mother    Anxiety disorder Mother    Coronary artery disease Father        at age 78   Hyperlipidemia Father  Thyroid  disease Father    Coronary artery disease Maternal Grandmother    Depression Maternal Grandmother    Cancer Paternal Grandmother        breast   Alcoholism Son    Sleep apnea Neg Hx    Multiple sclerosis Neg Hx     SOCIAL HISTORY:  Social History   Socioeconomic History   Marital status: Married    Spouse name: [name]   Number of children: 2   Years of education: COLLEGE   Highest education level: Not on file  Occupational History   Occupation: RETIRED  Tobacco Use   Smoking status: Former    Current packs/day: 0.00    Average packs/day: 0.5 packs/day for 7.0 years (3.5 ttl pk-yrs)    Types: Cigarettes    Start date: 08/21/1969    Quit date:  08/21/1976    Years since quitting: 47.5   Smokeless tobacco: Never  Vaping Use   Vaping status: Never Used  Substance and Sexual Activity   Alcohol  use: Not Currently   Drug use: No   Sexual activity: Not Currently    Birth control/protection: Post-menopausal  Other Topics Concern   Not on file  Social History Narrative   Lives in [place] with Husband Toni [name] [phone], [phone]   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   Right handed   Drinks a little caffeine in coffee sometimes    [place]    Pt [place]    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 Strain: Not on file  Food Insecurity: No Food Insecurity (08/13/2023)   Hunger Vital Sign    Worried About Running Out of Food in the Last Year: Never true    Ran Out of Food in the Last Year: Never true  Transportation Needs: No Transportation Needs (08/13/2023)   PRAPARE - Administrator, Civil Service (Medical): No    Lack of Transportation (Non-Medical): No  Physical Activity: Not on file  Stress: Not on file  Social Connections: Not on file  Intimate Partner Violence: Not At Risk (08/13/2023)   Humiliation, Afraid, Rape, and Kick questionnaire    Fear of Current or Ex-Partner: No    Emotionally Abused: No    Physically Abused: No    Sexually Abused: No     PHYSICAL EXAM  Vitals:   02/25/24 1457  BP: 115/75  Pulse: 76  Weight: 205 lb (93 kg)  Height: 5' 3 (1.6 m)       Body mass index is 36.31 kg/m.   General: The patient is well-developed and well-nourished and in no acute distress  HEENT:  Head is [place]/AT.    Neurologic Exam  Mental status: The patient is alert and oriented x 3 at the time of the examination. The patient has apparent normal recent and remote memory, with an apparently normal attention span and concentration ability.   .  Cranial nerves: Extraocular movements are full.  Facial strength was normal  Motor:  Muscle bulk reduced at the thenar eminences.  The legs are  hypotonic.  SHe has reduced grip bilaterally and 3/5 strength in the intrinsic hand muscles.  Strength is 2+/5 in the left leg and 2/5 in the right leg.  Sensory: Sensory testing is intact to touch x4.  Gait and station: She is wheelchair-bound..   Reflexes: Deep tendon reflexes are symmetric in the arms and absent in the legs.  There is no ankle clonus.    DIAGNOSTIC DATA (LABS, IMAGING, TESTING) - I  reviewed patient records, labs, notes, testing and imaging myself where available.  Lab Results  Component Value Date   WBC 5.2 02/23/2024   HGB 13.2 02/23/2024   HCT 40.5 02/23/2024   MCV 99.5 02/23/2024   PLT 171 02/23/2024      Component Value Date/Time   NA 137 02/23/2024 0444   NA 144 12/15/2020 1100   K 4.2 02/23/2024 0444   CL 97 (L) 02/23/2024 0444   CO2 33 (H) 02/23/2024 0444   GLUCOSE 77 02/23/2024 0444   BUN 26 (H) 02/23/2024 0444   BUN 27 12/15/2020 1100   CREATININE 0.78 02/23/2024 0444   CALCIUM  8.8 (L) 02/23/2024 0444   PROT 6.1 02/12/2024 1647   ALBUMIN 3.8 02/12/2024 1647   AST 15 02/12/2024 1647   ALT 10 02/12/2024 1647   ALKPHOS 70 02/12/2024 1647   BILITOT 0.3 02/12/2024 1647   GFRNONAA >60 02/23/2024 0444   GFRAA 90 09/20/2020 1436   Lab Results  Component Value Date   CHOL 111 03/19/2019   HDL 57 03/19/2019   LDLCALC 43 03/19/2019   TRIG 53 03/19/2019   CHOLHDL 3.0 04/22/2010   Lab Results  Component Value Date   HGBA1C 4.9 06/05/2020   No results found for: VITAMINB12 Lab Results  Component Value Date   TSH 3.470 02/12/2024       ASSESSMENT AND PLAN  Multiple sclerosis, primary chronic progressive (HCC) - Plan: baclofen  (GABLOFEN ) intrathecal injection 40000 mcg/20ml  Spastic quadriplegia (HCC) - Plan: baclofen  (GABLOFEN ) intrathecal injection 40000 mcg/70ml  Spasticity - Plan: baclofen  (GABLOFEN ) intrathecal injection 40000 mcg/30ml  Muscle spasm of both lower legs - Plan: baclofen  (GABLOFEN ) intrathecal injection 40000  mcg/71ml   Skin prepped with Betadine and draped in sterile fashion. Center port of implanted pump accessed using 22g huber needle.  Residual fluid measuring 10 ml was removed from the reservoir.  Gablofen  2077mcg/mlx2 instilled into pump. Pump reprogrammed to reflect a volume of 40ml.  ERI is 10/2027. Next appt given for 05/05/24 at 3:00pm with Dr. Vear.  Next alarm date: 05/16/2024.  Medication placed in pump: Gablofen  40,036mcg,20ml x2. Lot: [id number] Exp: 07/2025. NDC: [id number]. We will keep the rate and the bolus the same.Toni [name] She will return to see us  in about 3 months for the next pump refill.  She should call us  or Dr. Chalice sooner for new or worsening symptoms..   F/u with Dr. Chalice for other MS / neuro issues   Reema Chick A. Vear, MD, [facility] 02/25/2024, 8:44 PM Certified in Neurology, Clinical Neurophysiology, Sleep Medicine and Neuroimaging  [facility] [address], [address] [place], [place] [address] [phone]

## 2024-02-25 NOTE — Telephone Encounter (Signed)
 Pt called to informed MD , Pt was  hospitalized for her Ms this pass weekend.Pt states that  ER MD did a MRI and  discuss medication to Pt . Pt states she just wanted MD to be aware and would like a call back to discuss new medication or alternative treatment .

## 2024-02-25 NOTE — Progress Notes (Signed)
 RM 1, with husband. Skin prepped with Betadine and draped in sterile fashion. Center port of implanted pump accessed using 22g huber needle.  Residual fluid measuring 10 ml was removed from the reservoir.  Gablofen  2060mcg/mlx2 instilled into pump. Pump reprogrammed to reflect a volume of 40ml.  ERI is 10/2027.    Next appt given for 05/05/24 at 3:00pm with Dr. Vear.  Next alarm date: 05/16/2024.   Medication placed in pump: Gablofen  40,058mcg,20ml x2. Lot: [id number] Exp: 07/2025. NDC: [id number].   Template placed top right corner to measure. Below scar.

## 2024-02-25 NOTE — Telephone Encounter (Signed)
 Noted, thank you

## 2024-02-27 ENCOUNTER — Ambulatory Visit: Payer: Self-pay | Admitting: Neurology

## 2024-03-04 ENCOUNTER — Telehealth: Payer: Self-pay | Admitting: Neurology

## 2024-03-04 NOTE — Telephone Encounter (Signed)
 Pt called wanting to inform the provider that she has not been able to use her Bipap machine for a couple of weeks due to her mask leaking. She states that she has called the DME and they have had her try different masks but none have worked so far. Please advise.

## 2024-03-04 NOTE — Telephone Encounter (Signed)
 Dr Dohmeier made aware and states that she is ok if pt feels she can't wear the machine. Otherwise she can talk to adapt about the best fitting mask out of the options with the least amount of leaks but unfortunately there is not much else that can be offered in regards to the machine.

## 2024-03-05 NOTE — Telephone Encounter (Signed)
 Spoke w/Pt to make her aware of providers recommendation to try the mask that fits the best with the least amount of leakage. If she is unable to find a mask with a good fit Dr. Chalice is ok with her not using CPAP but really encourages using the CPAP if possible. Pt stated she is using oxygen  on the nights she is unable to use her CPAP. Pt stated understanding and will try the masks again and let us  know how she is doing.

## 2024-03-18 ENCOUNTER — Ambulatory Visit: Attending: Nurse Practitioner | Admitting: Nurse Practitioner

## 2024-03-18 ENCOUNTER — Encounter: Payer: Self-pay | Admitting: Nurse Practitioner

## 2024-03-18 ENCOUNTER — Other Ambulatory Visit: Payer: Self-pay

## 2024-03-18 VITALS — BP 120/68 | HR 60 | Ht 63.0 in | Wt 208.0 lb

## 2024-03-18 DIAGNOSIS — E039 Hypothyroidism, unspecified: Secondary | ICD-10-CM | POA: Diagnosis present

## 2024-03-18 DIAGNOSIS — E782 Mixed hyperlipidemia: Secondary | ICD-10-CM | POA: Insufficient documentation

## 2024-03-18 DIAGNOSIS — I428 Other cardiomyopathies: Secondary | ICD-10-CM | POA: Diagnosis present

## 2024-03-18 DIAGNOSIS — I4821 Permanent atrial fibrillation: Secondary | ICD-10-CM | POA: Diagnosis present

## 2024-03-18 DIAGNOSIS — I872 Venous insufficiency (chronic) (peripheral): Secondary | ICD-10-CM | POA: Diagnosis present

## 2024-03-18 DIAGNOSIS — I959 Hypotension, unspecified: Secondary | ICD-10-CM | POA: Diagnosis present

## 2024-03-18 DIAGNOSIS — I5042 Chronic combined systolic (congestive) and diastolic (congestive) heart failure: Secondary | ICD-10-CM

## 2024-03-18 DIAGNOSIS — G4733 Obstructive sleep apnea (adult) (pediatric): Secondary | ICD-10-CM

## 2024-03-18 DIAGNOSIS — I10 Essential (primary) hypertension: Secondary | ICD-10-CM | POA: Diagnosis present

## 2024-03-18 MED ORDER — TORSEMIDE 40 MG PO TABS: ORAL_TABLET | ORAL | 0 refills | Status: AC

## 2024-03-18 NOTE — Patient Instructions (Addendum)
 Medication Instructions:  INCREASE Torsemide  to 60mg  in the morning and 40mg  in the evening for 1 week then go back to 40mg  1 tablet twice a day  You can take an additional 20mg  of Torsemide  as needed for weight gain of 2lbs in a day or 5lbs in a week *If you need a refill on your cardiac medications before your next appointment, please call your pharmacy*  Lab Work: BMET IN 1 WEEK  If you have labs (blood work) drawn today and your tests are completely normal, you will receive your results only by: [employer] (if you have MyChart) OR A paper copy in the mail If you have any lab test that is abnormal or we need to change your treatment, we will call you to review the results.  Testing/Procedures: NONE ORDERED  Follow-Up: At [facility], you and your health needs are our priority.  As part of our continuing mission to provide you with exceptional heart care, our providers are all part of one team.  This team includes your primary Cardiologist (physician) and Advanced Practice Providers or APPs (Physician Assistants and Nurse Practitioners) who all work together to provide you with the care you need, when you need it.  Your next appointment:   FOLLOW UP AS SCHEDULED; IF YOU FEEL LIKE YOU NEED A SOONER APPOINTMENT    Provider:   Vinie JAYSON Maxcy, MD    We recommend signing up for the patient portal called MyChart.  Sign up information is provided on this After Visit Summary.  MyChart is used to connect with patients for Virtual Visits (Telemedicine).  Patients are able to view lab/test results, encounter notes, upcoming appointments, etc.  Non-urgent messages can be sent to your provider as well.   To learn more about what you can do with MyChart, go to [email or web address].   Other Instructions Please check your weight daily. Please contact the office if you gain more than 2lbs in a day or 5lbs in a week.  Limit your salt intake to 1500-2000mg  per day or 500mg  of Sodium  per meal.

## 2024-03-18 NOTE — Progress Notes (Unsigned)
 Office Visit    Patient Name: Toni [name] Date of Encounter: 03/18/2024  Primary Care Provider:  Albina GORMAN Dine, MD Primary Cardiologist:  Vinie JAYSON Maxcy, MD  Chief Complaint    78 year old female with a history of chronic combined systolic and diastolic heart failure, NICM, permanent atrial fibrillation, hypertension, hypotension, hyperlipidemia, chronic venous insufficiency, multiple sclerosis, hypothyroidism, and OSA who presents for follow-up related to heart failure and atrial fibrillation.   Past Medical History    Past Medical History:  Diagnosis Date   Abnormality of gait 03/01/2015   Acute metabolic encephalopathy 06/04/2020   Anxiety    Arthritis    knees (01/19/2016)   Asthma    as a child    Atrial fibrillation (HCC)    Back pain    Bilateral carpal tunnel syndrome 10/04/2020   Cancer (HCC)    / RENAL CELL CARCINOMA - GETS CT SCAN EVERY 6 MONTHS TO WATCH    CHF (congestive heart failure) (HCC)    Chronic pain    nerve pain from the MS   Complex atypical endometrial hyperplasia    Constipation    COVID-19 long hauler manifesting chronic dyspnea 09/13/2020   COVID-19 virus infection 08/23/2019   Depression    DVT (deep venous thrombosis) (HCC) 1983   RLE; may have just been phlebitis; it was before the age of dopplers   Dysrhythmia    afib    Edema    varicose veins with severe venous insuff in R and L GSV' ablation of R GSV 2012   GERD (gastroesophageal reflux disease)    HLD (hyperlipidemia)    Hypertension    Hypotension    Hypothyroidism    Joint pain    Multiple sclerosis (HCC)    has had this may 1989-[facility] reg doc   OSA on CPAP    bipap machine - SLEEP APNEA WORSE SINCE COVID IN 1/21 PER PATIENT SETTING HAS INCREASED FROM 9 TO 30    Osteoarthritis    PAF (paroxysmal atrial fibrillation) (HCC)    on coumadin ; documented on monitor 06/2010 - no longer takes per [facility] 02/09/21   Pneumonia 11/2011   Recurrent UTI  04/07/2020   Sepsis (HCC) 06/04/2020   Sepsis secondary to UTI (HCC) 11/20/2022   Sleep apnea    uses sleep apnea machine   Past Surgical History:  Procedure Laterality Date   ANTERIOR CERVICAL DECOMP/DISCECTOMY FUSION  01/2004   CARDIAC CATHETERIZATION  06/22/2010   normal coronary arteries, PAF   CATARACT EXTRACTION, BILATERAL Bilateral    CHILD BIRTHS     X2   DILATATION & CURETTAGE/HYSTEROSCOPY WITH MYOSURE N/A 01/15/2020   Procedure: DILATATION & CURETTAGE/HYSTEROSCOPY WITH MYOSURE;  Surgeon: Leva Rush, MD;  Location: [facility];  Service: Gynecology;  Laterality: N/A;  pt in wheelchair-has MS   DILATION AND CURETTAGE OF UTERUS     DILATION AND CURETTAGE OF UTERUS N/A 02/16/2020   Procedure: DILATATION AND CURETTAGE;  Surgeon: Viktoria Comer SAUNDERS, MD;  Location: [facility];  Service: Gynecology;  Laterality: N/A;  NOT GENERAL -NO INTUBATION   DNC     INFUSION PUMP IMPLANTATION  ~ 2009   baclofen  infusion in lower abd   INTRATHECAL PUMP REVISION N/A 02/18/2021   Procedure: Baclofen  Pump replacement with possible revision of catheter;  Surgeon: Unice Pac, MD;  Location: [facility];  Service: Neurosurgery;  Laterality: N/A;  3C/RM 20   INTRAUTERINE DEVICE (IUD) INSERTION N/A 02/16/2020   Procedure: INTRAUTERINE DEVICE (IUD) INSERTION - MIRENA ;  Surgeon: Viktoria Comer SAUNDERS, MD;  Location: [facility];  Service: Gynecology;  Laterality: N/A;   IUD put in uterus     PAIN PUMP REVISION N/A 07/29/2014   Procedure: Baclofen  pump replacement;  Surgeon: Fairy Levels, MD;  Location: [facility];  Service: Neurosurgery;  Laterality: N/A;  Baclofen  pump replacement   TONSILLECTOMY AND ADENOIDECTOMY     VARICOSE VEIN SURGERY  ~ 1968   VENOUS ABLATION  12/16/2010   radiofreq ablation -Dr Leafy and Hilty   WISDOM TOOTH EXTRACTION      Allergies  Allergies  Allergen Reactions   Other Hives and Rash    Topical Surgical prep  - severe rash, hives   Hydrocodone  Nausea And Vomiting   Oxycodone   Nausea And Vomiting    Other Reaction(s): Not available   Zoloft [Sertraline] Hives, Swelling and Rash   Chloraseptic [Phenol] Hives   Macrobid  [Nitrofurantoin ] Nausea And Vomiting    Not documented on MAR   Vioxx [Rofecoxib] Other (See Comments)    Unknown reaction Can take NSAIDS   Hibiclens  [Chlorhexidine  Gluconate] Hives, Rash and Other (See Comments)    Sores     Labs/Other Studies Reviewed    The following studies were reviewed today:  Cardiac Studies & Procedures   ______________________________________________________________________________________________     ECHOCARDIOGRAM  ECHOCARDIOGRAM COMPLETE 09/20/2023  Narrative ECHOCARDIOGRAM REPORT    Patient Name:   Toni [name] Date of Exam: 09/20/2023 Medical Rec #:  [id number]        Height:       62.0 in Accession #:    [id number]       Weight:       204.6 lb Date of Birth:  Dec 22, 1945         BSA:          1.930 m Patient Age:    77 years         BP:           132/82 mmHg Patient Gender: F                HR:           58 bpm. Exam Location:  [address]  Procedure: 2D Echo, Cardiac Doppler and Color Doppler  Indications:    I50.21 Acute Systolic Heart Failure  History:        Patient has prior history of Echocardiogram examinations, most recent 06/22/2022. CHF, Arrythmias:Atrial Fibrillation; Risk Factors:Hypertension and Dyslipidemia. Obstructive sleep apnea-CPAP. Edema.  Sonographer:    Carl Rodgers-Jones RDCS Referring Phys: [id number] KATHRYN M LAWRENCE  IMPRESSIONS   1. Left ventricular ejection fraction, by estimation, is 45%. The left ventricle has mildly decreased function. The left ventricle demonstrates global hypokinesis. Left ventricular diastolic function could not be evaluated. 2. Right ventricular systolic function is normal. The right ventricular size is normal. 3. Left atrial size was mildly dilated. 4. Right atrial size was mildly dilated. 5. The mitral valve is normal in structure.  Mild mitral valve regurgitation. No evidence of mitral stenosis. 6. The aortic valve is normal in structure. Aortic valve regurgitation is not visualized. No aortic stenosis is present. 7. The inferior vena cava is normal in size with greater than 50% respiratory variability, suggesting right atrial pressure of 3 mmHg.  FINDINGS Left Ventricle: Left ventricular ejection fraction, by estimation, is 45%. The left ventricle has mildly decreased function. The left ventricle demonstrates global hypokinesis. The left ventricular internal cavity size was normal in size. There is no left ventricular  hypertrophy. Left ventricular diastolic function could not be evaluated due to atrial fibrillation. Left ventricular diastolic function could not be evaluated.  Right Ventricle: The right ventricular size is normal. No increase in right ventricular wall thickness. Right ventricular systolic function is normal.  Left Atrium: Left atrial size was mildly dilated.  Right Atrium: Right atrial size was mildly dilated.  Pericardium: There is no evidence of pericardial effusion.  Mitral Valve: The mitral valve is normal in structure. Mild mitral valve regurgitation. No evidence of mitral valve stenosis.  Tricuspid Valve: The tricuspid valve is normal in structure. Tricuspid valve regurgitation is trivial. No evidence of tricuspid stenosis.  Aortic Valve: The aortic valve is normal in structure. Aortic valve regurgitation is not visualized. No aortic stenosis is present.  Pulmonic Valve: The pulmonic valve was normal in structure. Pulmonic valve regurgitation is not visualized. No evidence of pulmonic stenosis.  Aorta: The aortic root is normal in size and structure.  Venous: The inferior vena cava is normal in size with greater than 50% respiratory variability, suggesting right atrial pressure of 3 mmHg.  IAS/Shunts: No atrial level shunt detected by color flow Doppler.   LEFT VENTRICLE PLAX 2D LVIDd:          4.90 cm LVIDs:         3.50 cm LV PW:         1.20 cm LV IVS:        0.80 cm LVOT diam:     1.80 cm LV SV:         36 LV SV Index:   19 LVOT Area:     2.54 cm   RIGHT VENTRICLE            IVC RV Basal diam:  3.10 cm    IVC diam: 1.40 cm RV S prime:     8.89 cm/s TAPSE (M-mode): 2.1 cm  LEFT ATRIUM           Index        RIGHT ATRIUM           Index LA diam:      3.90 cm 2.02 cm/m   RA Area:     17.60 cm LA Vol (A2C): 46.3 ml 23.99 ml/m  RA Volume:   43.60 ml  22.59 ml/m LA Vol (A4C): 50.6 ml 26.21 ml/m AORTIC VALVE LVOT Vmax:   68.15 cm/s LVOT Vmean:  44.000 cm/s LVOT VTI:    0.142 m  AORTA Ao Root diam: 2.80 cm Ao Asc diam:  3.00 cm  MV E velocity: 77.73 cm/s SHUNTS Systemic VTI:  0.14 m Systemic Diam: 1.80 cm  Toribio Fuel MD Electronically signed by Toribio Fuel MD Signature Date/Time: 09/20/2023/3:52:27 PM    Final          ______________________________________________________________________________________________     Recent Labs: 08/31/2023: Magnesium  2.4 02/12/2024: ALT 10; BNP 95.8; TSH 3.470 02/23/2024: BUN 26; Creatinine, Ser 0.78; Hemoglobin 13.2; Platelets 171; Potassium 4.2; Sodium 137  Recent Lipid Panel    Component Value Date/Time   CHOL 111 03/19/2019 1517   TRIG 53 03/19/2019 1517   HDL 57 03/19/2019 1517   CHOLHDL 3.0 04/22/2010 0536   VLDL 29 04/22/2010 0536   LDLCALC 43 03/19/2019 1517    History of Present Illness    78 year old female with the above past medical history including chronic combined systolic and diastolic heart failure, NICM, permanent atrial fibrillation, hypertension, hypotension, hyperlipidemia, chronic venous insufficiency, multiple sclerosis, hypothyroidism, and OSA.   She  has a history of A-fib, now rate controlled, on amiodarone  and Eliquis .  Echocardiogram in 2023 showed EF 45 to 50%, global hypokinesis, mild LVH, normal RV systolic function, no significant valvular abnormalities.  Escalation of GDMT has been limited in the setting of hypotension, requiring midodrine . She also has a history of chronic venous insufficiency per lower extremity venous duplex in 2020.   Repeat echocardiogram in 08/2023 showed EF 45%, mildly decreased LV function, LV global hypokinesis, indeterminate diastolic parameters, normal RV systolic function, mild BAE, mild mitral valve regurgitation, no significant change from prior study. She was evaluated in the ED in March 2025 in the setting of constipation. Outpatient follow-up with GI was recommended.  She was last seen in the office on 02/12/2024 and was stable from a cardiac standpoint.  She did report weight gain, increased bilateral lower extremity edema, increased abdominal fullness.  She did report some improvement following increased torsemide  dosing.  Amiodarone  was decreased to 100 mg twice daily.  She was seen in the ED on 02/23/2024 in the setting of MS flare.  Outpatient follow-up with neurology was recommended.   She presents today for follow-up accompanied by her caregiver.  Since her last visit she has been Stable from a cardiac standpoint.  She continues to note lower extremity edema, abdominal fullness, mild weight gain.  Weight has fluctuated between 209 and 214 pounds.  Recent TSH was 7.17, rechecked 2 days later was 5.37.  Her Synthroid  was increased per PCP.  LDL was 96 in 02/2024.  Will have her increase torsemide  to 60 mg in the morning and 40 mg the evening x 1 week, followed by torsemide  40 mg in the morning and 40 mg in the evening.  At which point she may take an additional torsemide  20 mg daily as needed for swelling, weight gain.  Will check BMET in 1 week.  Follow-up as scheduled Dr. Ozell in 05/2024, sooner if needed.  Reviewed sodium and fluid recommendations, daily weights.  She denies any chest pain, palpitations, dizziness, dyspnea, PND, orthopnea.  1. Chronic combined systolic and diastolic heart failure/NICM: Most recent echo in  08/2023 showed EF 45%, mildly decreased LV function, LV global hypokinesis, indeterminate diastolic parameters, normal RV systolic function, mild BAE, mild mitral valve regurgitation, no significant change from prior study. Escalation of GDMT has been limited in the setting of hypotension, requiring midodrine .  She reports a nearly 20 pound weight gain over the past 3 months. 3 weeks ago, the nurse practitioner at her long-term care facility increased her torsemide  to 40 mg twice daily.  She has noticed a slow decrease in her weight.  BMET was stable on 02/11/2024.  Will check BMP, CBC today.  Diuresis is unfortunately limited in the setting of hypotension.  We discussed low threshold for ED evaluation, need for IV Lasix  should she continue to have weight gain, hypotension, despite increased diuresis.  Continue metoprolol , torsemide .   2. Permanent atrial fibrillation: Rate controlled.  Amiodarone  was temporarily increased at her last visit in the setting of elevated heart rate.  It appears maintenance dose was never resumed and she has remained on amiodarone  200 mg twice daily.  Will decrease amiodarone  to 100 mg twice daily.  Chest x-ray in 08/2023 was stable (mild to moderate cardiomegaly, no acute lung process).  Will update CBC, fasting lipid panel, TSH.  Continue to monitor HR.  Continue metoprolol , Eliquis .   3. Hypertension/Hypotension: BP low in office today, mildly improved with recheck.  Asymptomatic.  Will decrease  amiodarone  as above.  Consider decreasing metoprolol  if HR remains stable, and given need for additional diuresis. Continue current antihypertensive regimen, continue midodrine .   4. Hyperlipidemia: LDL was 92 in 12/2023.  She is not on lipid-lowering therapy at this time.    5. Chronic venous insufficiency: She has nonpitting bilateral lower extremity edema.  Continue compression, elevation.    6. Hypothyroidism: TSH was 1.494 in 05/2023.  Repeat TSH pending as above.  On  levothyroxine .   7. OSA: Adherent to BiPAP, managed per neurology.    8. MS: Followed by neurology.   9. Disposition: Follow-up as scheduled with Dr. Mona in 05/2024.   Home Medications    Current Outpatient Medications  Medication Sig Dispense Refill   corticotropin  (ACTHAR ) 80 UNIT/ML injectable gel Inject 80 mg every other day for 10 days and repeat monthy 5 mL 5   gabapentin  (NEURONTIN ) 300 MG capsule Mrs. Stefanski will take 300 mg of gabapentin  at 6 AM, 10 AM, 2 PM and 6 PM and 600 mg at 10 PM by mouth. (Patient taking differently: Take 300 mg by mouth 3 (three) times daily. Mrs. Maiorana will take 300 mg of gabapentin  at 6 AM, 10 AM, 2 PM and 6 PM and 600 mg at 10 PM by mouth.) 180 capsule 11   LINZESS  72 MCG capsule Take 72 mcg by mouth every morning.     metoprolol  succinate (TOPROL -XL) 25 MG 24 hr tablet Take 25 mg by mouth daily.     midodrine  (PROAMATINE ) 5 MG tablet Take 1 tablet (5 mg total) by mouth 3 (three) times daily with meals. (Patient taking differently: Take 5 mg by mouth 2 (two) times daily with a meal.)     acetaminophen  (TYLENOL ) 500 MG tablet Take 1,000 mg by mouth every 8 (eight) hours as needed for fever or headache (pain). (Patient not taking: Reported on 03/18/2024)     ALPRAZolam  (XANAX ) 0.5 MG tablet Take 1 tablet (0.5 mg total) by mouth at bedtime. May also take 1 tablet every 12 hours as needed anxiety 10 tablet 0   amiodarone  (PACERONE ) 100 MG tablet Take 100 mg by mouth 2 (two) times daily.     apixaban  (ELIQUIS ) 5 MG TABS tablet Take 5 mg by mouth 2 (two) times daily.     baclofen  (LIORESAL ) 20 MG tablet Take 20 mg by mouth daily as needed for muscle spasms. (Patient not taking: Reported on 03/18/2024)     Biotin  10000 MCG TABS Take 1 tablet by mouth daily.     bisacodyl  (DULCOLAX) 10 MG suppository Place 10 mg rectally See admin instructions. Insert 10 mg (1 suppository) rectally once daily on Sunday, Tuesday, Thursday. May use an additional suppository once daily  as needed for constipation. (Patient not taking: Reported on 03/18/2024)     buPROPion  (WELLBUTRIN  XL) 150 MG 24 hr tablet Take 1 tablet (150 mg total) by mouth daily.     cephALEXin  (KEFLEX ) 500 MG capsule Take 1 capsule (500 mg total) by mouth 2 (two) times daily. (Patient not taking: Reported on 02/25/2024) 20 capsule 0   cetirizine  (ZYRTEC ) 10 MG tablet Take 10 mg by mouth daily as needed for allergies. (Patient not taking: Reported on 03/18/2024)     cholecalciferol  (VITAMIN D3) 25 MCG (1000 UNIT) tablet Take 2,000 Units by mouth 2 (two) times daily.     clotrimazole  (LOTRIMIN ) 1 % cream Apply 1 Application topically 2 (two) times daily. To perineal area and abdominal folds     diphenhydrAMINE  (BENADRYL )  25 mg capsule Take 25 mg by mouth 3 (three) times daily as needed (minor itching, irritation). (Patient not taking: Reported on 03/18/2024)     docusate sodium  (COLACE) 100 MG capsule Take 100 mg by mouth 2 (two) times daily.     Emollient (AQUAPHOR ADV PROTECT HEALING) 41 % OINT Apply 1 application  topically See admin instructions. Apply topically every shift. Apply to redness in crease of buttocks every shift with and with each toileting times.     Emollient (CERAVE DAILY MOISTURIZING) LOTN Apply 1 application  topically daily. To groin, inguinal area     famotidine  (PEPCID ) 20 MG tablet Take 20 mg by mouth 2 (two) times daily.     fluticasone  (FLONASE ) 50 MCG/ACT nasal spray Place 1 spray into both nostrils daily as needed (for nasal congestion).     hydrocortisone  (ANUSOL -HC) 25 MG suppository Place 1 suppository (25 mg total) rectally 2 (two) times daily. (Patient not taking: Reported on 03/18/2024) 14 suppository 4   lactulose  (CHRONULAC ) 10 GM/15ML solution Take 10 g by mouth 2 (two) times daily as needed.     levothyroxine  (SYNTHROID ) 100 MCG tablet Take 100 mcg by mouth daily.     levothyroxine  (SYNTHROID ) 75 MCG tablet Take 75 mcg by mouth daily before breakfast.     melatonin 5 MG TABS  Take 5 mg by mouth at bedtime.     Menthol , Topical Analgesic, (BIOFREEZE COOL THE PAIN) 4 % GEL Apply 1 application  topically 3 (three) times daily. To left calf     metoprolol  tartrate (LOPRESSOR ) 50 MG tablet Take 0.5 tablets (25 mg total) by mouth daily. 30 tablet 0   ondansetron  (ZOFRAN -ODT) 4 MG disintegrating tablet Take 4 mg by mouth 4 (four) times daily as needed for nausea. (Patient not taking: Reported on 03/18/2024)     oxybutynin  (DITROPAN ) 5 MG tablet Take 5 mg by mouth 2 (two) times daily as needed (overactive bladder).     phenazopyridine  (PYRIDIUM ) 100 MG tablet Take 100 mg by mouth every 8 (eight) hours as needed (dysuria).     phenylephrine -shark liver oil-mineral oil-petrolatum (PREPARATION H) 0.25-14-74.9 % rectal ointment Place 1 Application rectally 3 (three) times daily as needed for hemorrhoids. (Patient not taking: Reported on 03/18/2024)     Polyethyl Glycol-Propyl Glycol (SYSTANE) 0.4-0.3 % SOLN Place 1 drop into both eyes in the morning and at bedtime.     polyethylene glycol (MIRALAX  / GLYCOLAX ) 17 g packet Take 17 g by mouth daily as needed for mild constipation, moderate constipation or severe constipation.     potassium chloride  SA (KLOR-CON ) 20 MEQ tablet Take 2 tablets (40 mEq total) by mouth daily. 180 tablet 3   promethazine  (PHENERGAN ) 12.5 MG tablet Take 12.5 mg by mouth every 8 (eight) hours as needed for nausea or vomiting. (Patient not taking: Reported on 03/18/2024)     Prucalopride Succinate  (MOTEGRITY ) 2 MG TABS Take 2 mg by mouth daily before breakfast.     Psyllium (METAMUCIL SMOOTH TEXTURE PO) Take 1 packet by mouth 2 (two) times daily.     Salicylic Acid-Cleanser 6 % CREAM KIT Apply 1 each topically See admin instructions. Apply topically once a day on Monday, Wednesday, Saturday to lesion on foot 3x weekly. Once soft, gently file with disposible nail file weekly, repeat until resolved.     senna (SENOKOT) 8.6 MG TABS tablet Take 17.2 mg by mouth 2 (two)  times daily.     silver  sulfADIAZINE (SILVADENE) 1 % cream Apply 1 Application  topically 2 (two) times daily.     sodium phosphate  (FLEET) 7-19 GM/118ML ENEM Place 1 enema rectally daily as needed (constipation).     torsemide  (DEMADEX ) 20 MG tablet Take 1 tablet (20 mg total) by mouth daily as needed (may take an additional 20 mg on the days of swelling or weight gain of 3 lb over night or 5 lb in 1 week.). 90 tablet 3   Torsemide  40 MG TABS Take 40 mg by mouth in the morning and at bedtime.     triamcinolone cream (KENALOG) 0.1 % Apply 1 Application topically 2 (two) times daily.     Current Facility-Administered Medications  Medication Dose Route Frequency Provider Last Rate Last Admin   baclofen  (GABLOFEN ) intrathecal injection 40000 mcg/20ml  80,000 mcg Intrathecal Once Sater, Charlie LABOR, MD       baclofen  (GABLOFEN ) intrathecal injection 40000 mcg/20ml  80,000 mcg Intrathecal Once Sater, Charlie LABOR, MD         Review of Systems    ***.  All other systems reviewed and are otherwise negative except as noted above.    Physical Exam    VS:  BP 120/68 (BP Location: Left Arm, Patient Position: Sitting, Cuff Size: Large)   Pulse 60   Ht 5' 3 (1.6 m)   Wt 208 lb (94.3 kg)   BMI 36.85 kg/m   GEN: Well nourished, well developed, in no acute distress. HEENT: normal. Neck: Supple, no JVD, carotid bruits, or masses. Cardiac: RRR, no murmurs, rubs, or gallops. No clubbing, cyanosis, edema.  Radials/DP/PT 2+ and equal bilaterally.  Respiratory:  Respirations regular and unlabored, clear to auscultation bilaterally. GI: Soft, nontender, nondistended, BS + x 4. MS: no deformity or atrophy. Skin: warm and dry, no rash. Neuro:  Strength and sensation are intact. Psych: Normal affect.  Accessory Clinical Findings    ECG personally reviewed by me today - EKG Interpretation Date/Time:  Tuesday March 18 2024 14:46:29 EDT Ventricular Rate:  60 PR Interval:    QRS Duration:  112 QT  Interval:  462 QTC Calculation: 462 R Axis:   84  Text Interpretation: Atrial fibrillation Low voltage QRS When compared with ECG of 12-Feb-2024 15:26, No significant change was found Confirmed by Daneen Perkins ([id number]) on 03/18/2024 2:48:50 PM  - no acute changes.   Lab Results  Component Value Date   WBC 5.2 02/23/2024   HGB 13.2 02/23/2024   HCT 40.5 02/23/2024   MCV 99.5 02/23/2024   PLT 171 02/23/2024   Lab Results  Component Value Date   CREATININE 0.78 02/23/2024   BUN 26 (H) 02/23/2024   NA 137 02/23/2024   K 4.2 02/23/2024   CL 97 (L) 02/23/2024   CO2 33 (H) 02/23/2024   Lab Results  Component Value Date   ALT 10 02/12/2024   AST 15 02/12/2024   ALKPHOS 70 02/12/2024   BILITOT 0.3 02/12/2024   Lab Results  Component Value Date   CHOL 111 03/19/2019   HDL 57 03/19/2019   LDLCALC 43 03/19/2019   TRIG 53 03/19/2019   CHOLHDL 3.0 04/22/2010    Lab Results  Component Value Date   HGBA1C 4.9 06/05/2020    Assessment & Plan    1.  ***      Perkins JAYSON Daneen, NP 03/18/2024, 3:08 PM

## 2024-03-19 ENCOUNTER — Encounter: Payer: Self-pay | Admitting: Nurse Practitioner

## 2024-03-25 LAB — BASIC METABOLIC PANEL WITH GFR: EGFR: 65

## 2024-03-27 ENCOUNTER — Ambulatory Visit (HOSPITAL_BASED_OUTPATIENT_CLINIC_OR_DEPARTMENT_OTHER): Payer: Self-pay | Admitting: Family

## 2024-04-08 ENCOUNTER — Encounter: Payer: Self-pay | Admitting: Neurology

## 2024-04-08 ENCOUNTER — Ambulatory Visit (INDEPENDENT_AMBULATORY_CARE_PROVIDER_SITE_OTHER): Admitting: Neurology

## 2024-04-08 VITALS — BP 108/59 | HR 68 | Ht 64.0 in | Wt 208.0 lb

## 2024-04-08 DIAGNOSIS — Z789 Other specified health status: Secondary | ICD-10-CM

## 2024-04-08 DIAGNOSIS — I482 Chronic atrial fibrillation, unspecified: Secondary | ICD-10-CM | POA: Diagnosis not present

## 2024-04-08 DIAGNOSIS — E240 Pituitary-dependent Cushing's disease: Secondary | ICD-10-CM

## 2024-04-08 DIAGNOSIS — G4737 Central sleep apnea in conditions classified elsewhere: Secondary | ICD-10-CM

## 2024-04-08 NOTE — Patient Instructions (Signed)
 Rv in 6-8 months with MD.

## 2024-04-08 NOTE — Progress Notes (Addendum)
 Provider:  Dedra Gores, MD  Primary Care Physician:  Albina GORMAN Dine, MD [address] [place] [address]     Referring Provider: Albina GORMAN Dine, Md [address] [place],  [place] [address]          Chief Complaint according to patient   Patient presents with:                HISTORY OF PRESENT ILLNESS:  Toni [name] is a 78 y.o. female patient with longstanding ties to [facility], with secondary progressive MS and wheelchair bound now, Cushing's with ACTH , and central apnea,  atrial fib. Obesity.  Used in the past ASV-PAP with oxygen  but has become unable to use ASV due to  face burping She is here for revisit 04/08/2024 for  trying out PAP masks with me , and she found the best fit in an Airtouch FFM , she has used a chin strap with nasal pillows too.  I have some samples for her- we tried these interfaces in my exam room.  I would like to reorder the Airtouch Medium FFM F 20 and headgear  in size small for women F 20.    Chief concern according to patient :   I just had a lot of air leaking into my eyes and stopped using the device , and used only oxygen .   The patient has the help of the nurse at her nursing facility.  We found her to likely do better wit a small FFM and headgear and chin strap.   She pools fluid, has severe edema and is  likely in CHF.   Review of Systems: Out of a complete 14 system review, the patient complains of only the following symptoms, and all other reviewed systems are negative.:   SLEEPINESS ?  How likely are you to doze in the following situations: 0 = not likely, 1 = slight chance, 2 = moderate chance, 3 = high chance  Sitting and Reading? Watching Television? Sitting inactive in a public place (theater or meeting)? Lying down in the afternoon when circumstances permit? Sitting and talking to someone? Sitting quietly after lunch without alcohol ? In a car, while stopped for a few minutes in traffic? As a  passenger in a car for an hour without a break?  Total =  16/ 24  Excessive       Social History   Socioeconomic History   Marital status: Married    Spouse name: [name]   Number of children: 2   Years of education: COLLEGE   Highest education level: Not on file  Occupational History   Occupation: RETIRED  Tobacco Use   Smoking status: Former    Current packs/day: 0.00    Average packs/day: 0.5 packs/day for 7.0 years (3.5 ttl pk-yrs)    Types: Cigarettes    Start date: 08/21/1969    Quit date: 08/21/1976    Years since quitting: 47.6   Smokeless tobacco: Never  Vaping Use   Vaping status: Never Used  Substance and Sexual Activity   Alcohol  use: Not Currently   Drug use: No   Sexual activity: Not Currently    Birth control/protection: Post-menopausal  Other Topics Concern   Not on file  Social History Narrative   Lives in [place] with Husband Tanda [phone], [phone]   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   Right handed   Drinks a little caffeine in coffee sometimes  [facility]    [facility]    Social Drivers of Corporate Investment Banker Strain: Not on file  Food Insecurity: No Food Insecurity (08/13/2023)   Hunger Vital Sign    Worried About Running Out of Food in the Last Year: Never true    Ran Out of Food in the Last Year: Never true  Transportation Needs: No Transportation Needs (08/13/2023)   PRAPARE - Administrator, Civil Service (Medical): No    Lack of Transportation (Non-Medical): No  Physical Activity: Not on file  Stress: Not on file  Social Connections: Not on file    Family History  Problem Relation Age of Onset   Depression Mother    Sudden death Mother    Anxiety disorder Mother    Coronary artery disease Father        at age 85   Hyperlipidemia Father    Thyroid  disease Father    Coronary artery disease Maternal Grandmother    Depression Maternal Grandmother    Cancer Paternal Grandmother        breast   Alcoholism  Son    Sleep apnea Neg Hx    Multiple sclerosis Neg Hx     Past Medical History:  Diagnosis Date   Abnormality of gait 03/01/2015   Acute metabolic encephalopathy 06/04/2020   Anxiety    Arthritis    knees (01/19/2016)   Asthma    as a child    Atrial fibrillation (HCC)    Back pain    Bilateral carpal tunnel syndrome 10/04/2020   Cancer (HCC)    / RENAL CELL CARCINOMA - GETS CT SCAN EVERY 6 MONTHS TO WATCH    CHF (congestive heart failure) (HCC)    Chronic pain    nerve pain from the MS   Complex atypical endometrial hyperplasia    Constipation    COVID-19 long hauler manifesting chronic dyspnea 09/13/2020   COVID-19 virus infection 08/23/2019   Depression    DVT (deep venous thrombosis) (HCC) 1983   RLE; may have just been phlebitis; it was before the age of dopplers   Dysrhythmia    afib    Edema    varicose veins with severe venous insuff in R and L GSV' ablation of R GSV 2012   GERD (gastroesophageal reflux disease)    HLD (hyperlipidemia)    Hypertension    Hypotension    Hypothyroidism    Joint pain    Multiple sclerosis (HCC)    has had this may 1989-[facility] reg doc   OSA on CPAP    bipap machine - SLEEP APNEA WORSE SINCE COVID IN 1/21 PER PATIENT SETTING HAS INCREASED FROM 9 TO 30    Osteoarthritis    PAF (paroxysmal atrial fibrillation) (HCC)    on coumadin ; documented on monitor 06/2010 - no longer takes per [facility] 02/09/21   Pneumonia 11/2011   Recurrent UTI 04/07/2020   Sepsis (HCC) 06/04/2020   Sepsis secondary to UTI (HCC) 11/20/2022   Sleep apnea    uses sleep apnea machine    Past Surgical History:  Procedure Laterality Date   ANTERIOR CERVICAL DECOMP/DISCECTOMY FUSION  01/2004   CARDIAC CATHETERIZATION  06/22/2010   normal coronary arteries, PAF   CATARACT EXTRACTION, BILATERAL Bilateral    CHILD BIRTHS     X2   DILATATION & CURETTAGE/HYSTEROSCOPY WITH MYOSURE N/A 01/15/2020   Procedure: DILATATION & CURETTAGE/HYSTEROSCOPY  WITH MYOSURE;  Surgeon: Leva Rush, MD;  Location:  [facility];  Service: Gynecology;  Laterality: N/A;  pt in wheelchair-has MS   DILATION AND CURETTAGE OF UTERUS     DILATION AND CURETTAGE OF UTERUS N/A 02/16/2020   Procedure: DILATATION AND CURETTAGE;  Surgeon: Viktoria Comer SAUNDERS, MD;  Location: [facility];  Service: Gynecology;  Laterality: N/A;  NOT GENERAL -NO INTUBATION   DNC     INFUSION PUMP IMPLANTATION  ~ 2009   baclofen  infusion in lower abd   INTRATHECAL PUMP REVISION N/A 02/18/2021   Procedure: Baclofen  Pump replacement with possible revision of catheter;  Surgeon: Unice Pac, MD;  Location: [facility] OR;  Service: Neurosurgery;  Laterality: N/A;  3C/RM 20   INTRAUTERINE DEVICE (IUD) INSERTION N/A 02/16/2020   Procedure: INTRAUTERINE DEVICE (IUD) INSERTION - MIRENA ;  Surgeon: Viktoria Comer SAUNDERS, MD;  Location: [facility];  Service: Gynecology;  Laterality: N/A;   IUD put in uterus     PAIN PUMP REVISION N/A 07/29/2014   Procedure: Baclofen  pump replacement;  Surgeon: Pac Unice, MD;  Location: [facility];  Service: Neurosurgery;  Laterality: N/A;  Baclofen  pump replacement   TONSILLECTOMY AND ADENOIDECTOMY     VARICOSE VEIN SURGERY  ~ 1968   VENOUS ABLATION  12/16/2010   radiofreq ablation -Dr Leafy and [facility]   WISDOM TOOTH EXTRACTION       Current Outpatient Medications on File Prior to Visit  Medication Sig Dispense Refill   acetaminophen  (TYLENOL ) 500 MG tablet Take 1,000 mg by mouth every 8 (eight) hours as needed for fever or headache (pain). (Patient not taking: Reported on 03/18/2024)     ALPRAZolam  (XANAX ) 0.5 MG tablet Take 1 tablet (0.5 mg total) by mouth at bedtime. May also take 1 tablet every 12 hours as needed anxiety 10 tablet 0   amiodarone  (PACERONE ) 100 MG tablet Take 100 mg by mouth 2 (two) times daily.     apixaban  (ELIQUIS ) 5 MG TABS tablet Take 5 mg by mouth 2 (two) times daily.     baclofen  (LIORESAL ) 20 MG tablet Take 20 mg by mouth daily as needed for muscle  spasms. (Patient not taking: Reported on 03/18/2024)     Biotin  10000 MCG TABS Take 1 tablet by mouth daily.     bisacodyl  (DULCOLAX) 10 MG suppository Place 10 mg rectally See admin instructions. Insert 10 mg (1 suppository) rectally once daily on Sunday, Tuesday, Thursday. May use an additional suppository once daily as needed for constipation. (Patient not taking: Reported on 03/18/2024)     buPROPion  (WELLBUTRIN  XL) 150 MG 24 hr tablet Take 1 tablet (150 mg total) by mouth daily.     cephALEXin  (KEFLEX ) 500 MG capsule Take 1 capsule (500 mg total) by mouth 2 (two) times daily. (Patient not taking: Reported on 02/25/2024) 20 capsule 0   cetirizine  (ZYRTEC ) 10 MG tablet Take 10 mg by mouth daily as needed for allergies. (Patient not taking: Reported on 03/18/2024)     cholecalciferol  (VITAMIN D3) 25 MCG (1000 UNIT) tablet Take 2,000 Units by mouth 2 (two) times daily.     clotrimazole  (LOTRIMIN ) 1 % cream Apply 1 Application topically 2 (two) times daily. To perineal area and abdominal folds     corticotropin  (ACTHAR ) 80 UNIT/ML injectable gel Inject 80 mg every other day for 10 days and repeat monthy 5 mL 5   diphenhydrAMINE  (BENADRYL ) 25 mg capsule Take 25 mg by mouth 3 (three) times daily as needed (minor itching, irritation). (Patient not taking: Reported on 03/18/2024)     docusate sodium  (  COLACE) 100 MG capsule Take 100 mg by mouth 2 (two) times daily.     Emollient (AQUAPHOR ADV PROTECT HEALING) 41 % OINT Apply 1 application  topically See admin instructions. Apply topically every shift. Apply to redness in crease of buttocks every shift with and with each toileting times.     Emollient (CERAVE DAILY MOISTURIZING) LOTN Apply 1 application  topically daily. To groin, inguinal area     famotidine  (PEPCID ) 20 MG tablet Take 20 mg by mouth 2 (two) times daily.     fluticasone  (FLONASE ) 50 MCG/ACT nasal spray Place 1 spray into both nostrils daily as needed (for nasal congestion).     gabapentin   (NEURONTIN ) 300 MG capsule Mrs. Snedden will take 300 mg of gabapentin  at 6 AM, 10 AM, 2 PM and 6 PM and 600 mg at 10 PM by mouth. (Patient taking differently: Take 300 mg by mouth 3 (three) times daily. Mrs. Nellums will take 300 mg of gabapentin  at 6 AM, 10 AM, 2 PM and 6 PM and 600 mg at 10 PM by mouth.) 180 capsule 11   hydrocortisone  (ANUSOL -HC) 25 MG suppository Place 1 suppository (25 mg total) rectally 2 (two) times daily. (Patient not taking: Reported on 03/18/2024) 14 suppository 4   lactulose  (CHRONULAC ) 10 GM/15ML solution Take 10 g by mouth 2 (two) times daily as needed.     levothyroxine  (SYNTHROID ) 100 MCG tablet Take 100 mcg by mouth daily.     levothyroxine  (SYNTHROID ) 75 MCG tablet Take 75 mcg by mouth daily before breakfast.     LINZESS  72 MCG capsule Take 72 mcg by mouth every morning.     melatonin 5 MG TABS Take 5 mg by mouth at bedtime.     Menthol , Topical Analgesic, (BIOFREEZE COOL THE PAIN) 4 % GEL Apply 1 application  topically 3 (three) times daily. To left calf     metoprolol  succinate (TOPROL -XL) 25 MG 24 hr tablet Take 25 mg by mouth daily.     metoprolol  tartrate (LOPRESSOR ) 50 MG tablet Take 0.5 tablets (25 mg total) by mouth daily. 30 tablet 0   midodrine  (PROAMATINE ) 5 MG tablet Take 1 tablet (5 mg total) by mouth 3 (three) times daily with meals. (Patient taking differently: Take 5 mg by mouth 2 (two) times daily with a meal.)     ondansetron  (ZOFRAN -ODT) 4 MG disintegrating tablet Take 4 mg by mouth 4 (four) times daily as needed for nausea. (Patient not taking: Reported on 03/18/2024)     oxybutynin  (DITROPAN ) 5 MG tablet Take 5 mg by mouth 2 (two) times daily as needed (overactive bladder).     phenazopyridine  (PYRIDIUM ) 100 MG tablet Take 100 mg by mouth every 8 (eight) hours as needed (dysuria).     phenylephrine -shark liver oil-mineral oil-petrolatum (PREPARATION H) 0.25-14-74.9 % rectal ointment Place 1 Application rectally 3 (three) times daily as needed for  hemorrhoids. (Patient not taking: Reported on 03/18/2024)     Polyethyl Glycol-Propyl Glycol (SYSTANE) 0.4-0.3 % SOLN Place 1 drop into both eyes in the morning and at bedtime.     polyethylene glycol (MIRALAX  / GLYCOLAX ) 17 g packet Take 17 g by mouth daily as needed for mild constipation, moderate constipation or severe constipation.     potassium chloride  SA (KLOR-CON ) 20 MEQ tablet Take 2 tablets (40 mEq total) by mouth daily. 180 tablet 3   promethazine  (PHENERGAN ) 12.5 MG tablet Take 12.5 mg by mouth every 8 (eight) hours as needed for nausea or vomiting. (Patient not  taking: Reported on 03/18/2024)     Prucalopride Succinate  (MOTEGRITY ) 2 MG TABS Take 2 mg by mouth daily before breakfast.     Psyllium (METAMUCIL SMOOTH TEXTURE PO) Take 1 packet by mouth 2 (two) times daily.     Salicylic Acid-Cleanser 6 % CREAM KIT Apply 1 each topically See admin instructions. Apply topically once a day on Monday, Wednesday, Saturday to lesion on foot 3x weekly. Once soft, gently file with disposible nail file weekly, repeat until resolved. (Patient not taking: Reported on 03/18/2024)     senna (SENOKOT) 8.6 MG TABS tablet Take 17.2 mg by mouth 2 (two) times daily.     silver  sulfADIAZINE (SILVADENE) 1 % cream Apply 1 Application topically 2 (two) times daily.     sodium phosphate  (FLEET) 7-19 GM/118ML ENEM Place 1 enema rectally daily as needed (constipation). (Patient not taking: Reported on 03/18/2024)     torsemide  (DEMADEX ) 20 MG tablet Take 1 tablet (20 mg total) by mouth daily as needed (may take an additional 20 mg on the days of swelling or weight gain of 3 lb over night or 5 lb in 1 week.). (Patient not taking: Reported on 03/18/2024) 90 tablet 3   Torsemide  40 MG TABS Take 60mg  in the morning and 40mg  in the evening for 7 days then go to 40mg  1 tablet twice a day Can take an additional 20mg  as needed for weight gain of 2lbs in a day or 5lbs in a week 100 tablet 0   triamcinolone cream (KENALOG) 0.1 %  Apply 1 Application topically 2 (two) times daily.     Current Facility-Administered Medications on File Prior to Visit  Medication Dose Route Frequency Provider Last Rate Last Admin   baclofen  (GABLOFEN ) intrathecal injection 40000 mcg/20ml  80,000 mcg Intrathecal Once Sater, Charlie LABOR, MD       baclofen  (GABLOFEN ) intrathecal injection 40000 mcg/20ml  80,000 mcg Intrathecal Once Sater, Charlie LABOR, MD        Allergies  Allergen Reactions   Other Hives and Rash    Topical Surgical prep  - severe rash, hives   Hydrocodone  Nausea And Vomiting   Oxycodone  Nausea And Vomiting    Other Reaction(s): Not available   Zoloft [Sertraline] Hives, Swelling and Rash   Chloraseptic [Phenol] Hives   Macrobid  [Nitrofurantoin ] Nausea And Vomiting    Not documented on MAR   Vioxx [Rofecoxib] Other (See Comments)    Unknown reaction Can take NSAIDS   Hibiclens  [Chlorhexidine  Gluconate] Hives, Rash and Other (See Comments)    Sores     DIAGNOSTIC DATA (LABS, IMAGING, TESTING) - I reviewed patient records, labs, notes, testing and imaging myself where available.  Lab Results  Component Value Date   WBC 5.2 02/23/2024   HGB 13.2 02/23/2024   HCT 40.5 02/23/2024   MCV 99.5 02/23/2024   PLT 171 02/23/2024      Component Value Date/Time   NA 137 02/23/2024 0444   NA 144 12/15/2020 1100   K 4.2 02/23/2024 0444   CL 97 (L) 02/23/2024 0444   CO2 33 (H) 02/23/2024 0444   GLUCOSE 77 02/23/2024 0444   BUN 26 (H) 02/23/2024 0444   BUN 27 12/15/2020 1100   CREATININE 0.78 02/23/2024 0444   CALCIUM  8.8 (L) 02/23/2024 0444   PROT 6.1 02/12/2024 1647   ALBUMIN 3.8 02/12/2024 1647   AST 15 02/12/2024 1647   ALT 10 02/12/2024 1647   ALKPHOS 70 02/12/2024 1647   BILITOT 0.3 02/12/2024 1647  GFRNONAA >60 02/23/2024 0444   GFRNONAA >90 02/11/2024 1004   GFRAA 90 09/20/2020 1436   Lab Results  Component Value Date   CHOL 111 03/19/2019   HDL 57 03/19/2019   LDLCALC 43 03/19/2019   TRIG 53  03/19/2019   CHOLHDL 3.0 04/22/2010   Lab Results  Component Value Date   HGBA1C 4.9 06/05/2020   No results found for: VITAMINB12 Lab Results  Component Value Date   TSH 3.470 02/12/2024    PHYSICAL EXAM:  Vitals:   04/08/24 1539  BP: (!) 108/59  Pulse: 68   No data found. Body mass index is 35.7 kg/m.   Wt Readings from Last 3 Encounters:  04/08/24 208 lb (94.3 kg)  03/18/24 208 lb (94.3 kg)  02/25/24 205 lb (93 kg)     Ht Readings from Last 3 Encounters:  04/08/24 5' 4 (1.626 m)  03/18/24 5' 3 (1.6 m)  02/25/24 5' 3 (1.6 m)      General: The patient is awake, alert and appears not in acute distress and groomed. Head: Normocephalic, atraumatic.  Neck is large and not supple,. Cardiovascular:  irregular rate and cardiac rhythm by pulse, without distended neck veins. Respiratory: no shortness of breath  Skin:  Without evidence of ankle edema, or rash. Trunk: BMI is  35.7     NEUROLOGIC EXAM: The patient is sleepy, oriented to place and time.   Memory subjective described as intact.  Attention span & concentration ability appears normal.   Speech is broken , not  fluent due to SOB -  with dysphonia  Mood and affect are appropriate.   Neurological Examination: Mental Status: Intact. Language intact.  No cognitive deficits. Cranial Nerves II-XII: Intact. PERL. EOMI. VFF. No nystagmus. Hearing is grossly intact bilaterally.  The tongue is normal and midline. Wheelchair bound/   ASSESSMENT AND PLAN :   78 y.o. year old female  here with:  Central sleep apnea, unresponsive to CPAP , BiPAP and best controlled on 3-13 cm water PS and EPAP 12 cm water  ASV     1) ASV machine is not Wifi capable ,modem is not going on 5 G.   2)  decrease max pressure support from 13 to 11 cm water , asking DME to do that.   3)  restart with the ordered new mask and bleed o2 into the device.   4) continue ACTH  regimen. Order goes to [employer] , Mrs Danielski will  need the injections to be given at her nursing home.   5) Dr Vear will refill baclofen  pump on 07-15-2024.    Follow up with Np or me in 6-8 months again, with ASV download.    .    I would like to thank Albina GORMAN Dine, MD and Albina GORMAN Dine, Md [address] [place],  [place] [address] for allowing me to meet with this pleasant patient.   Sleep Clinic Patients are generally offered input on sleep hygiene, life style changes and how to improve compliance with medical treatment where applicable. Review and reiteration of good sleep hygiene measures is offered to any sleep clinic patient, be it in the first consultation or with any follow up visits.    Any patient with sleepiness should be cautioned not to drive, work at heights, or operate dangerous or heavy equipment when feeling tired or sleepy.      The patient will be seen in follow-up in the sleep clinic at [facility] for discussion of  test results, sleep related symptoms and treatment compliance review, further management strategies, etc.   The referring provider will be notified of the test results.   The patient's condition requires frequent monitoring and adjustments in the treatment plan, reflecting the ongoing complexity of care.  This provider is the continuing focal point for all needed services for this condition.  After spending a total time of  35  minutes face to face and time for  history taking, physical and neurologic examination, review of laboratory studies,  personal review of imaging studies, reports and results of other testing and review of referral information / records as far as provided in visit,   Electronically signed by: Dedra Gores, MD 04/08/2024 3:44 PM  [facility] and [facility] Sleep Board certified by The [employer] of Sleep Medicine and Diplomate of the [employer] of Sleep Medicine. Board certified In Neurology through the ABPN, Fellow of the [employer] of  Neurology.

## 2024-04-09 ENCOUNTER — Ambulatory Visit: Payer: Medicare Other | Admitting: Adult Health

## 2024-04-09 ENCOUNTER — Telehealth: Payer: Self-pay | Admitting: Neurology

## 2024-04-09 ENCOUNTER — Ambulatory Visit: Admitting: Neurology

## 2024-04-09 NOTE — Telephone Encounter (Addendum)
 Pt called stating that [employer] has not received the order for her cpap pressure change. She stated that it can be faxed to [phone]. Please advise.

## 2024-04-09 NOTE — Telephone Encounter (Signed)
 Sending the order today for the patient. Will fax as requested and send through community message as well. Received confirmation of the fax going through.

## 2024-04-17 ENCOUNTER — Telehealth: Payer: Self-pay | Admitting: Internal Medicine

## 2024-04-17 NOTE — Telephone Encounter (Signed)
 Patient calling in about the mychart message. Please advise

## 2024-04-17 NOTE — Telephone Encounter (Signed)
 Spoke with pt regarding her MyChart message. Pt was told what Damien Braver, NP responded with:  If she is benefiting from the additional 20 mg of torsemide , recommend increasing a.m. dose of torsemide  to 60 mg, continue to take torsemide  40 mg in the afternoon/evening. Would recommend BMET in 2 weeks. Otherwise, continue to monitor symptoms, weights, follow-up as planned. Thank you-EM   Pt asked that this information be sent via MyChart message so that she can relay it to the nurses that dispense her medications and draw her labs. Pt did not have a fax number to provide. Pt was told Emily's response would be sent to her via MyChart. MyChart message sent. Pt verbalized understanding. All questions if any were answered.

## 2024-04-18 ENCOUNTER — Other Ambulatory Visit: Payer: Self-pay

## 2024-04-18 DIAGNOSIS — I5042 Chronic combined systolic (congestive) and diastolic (congestive) heart failure: Secondary | ICD-10-CM

## 2024-04-18 DIAGNOSIS — Z79899 Other long term (current) drug therapy: Secondary | ICD-10-CM

## 2024-04-18 NOTE — Telephone Encounter (Signed)
 Spoke with pt. Pt received Mychart messages and will need orders sent to her nursing facility. Letter completed and faxed to [phone] per pts request.

## 2024-04-18 NOTE — Telephone Encounter (Signed)
 Pt is calling with the fax number for the nurse who dispenses her medication  [facility] Fax: [phone] Attn: front home nurse

## 2024-05-02 LAB — LAB REPORT - SCANNED: EGFR: 89

## 2024-05-05 ENCOUNTER — Encounter: Payer: Self-pay | Admitting: Neurology

## 2024-05-05 ENCOUNTER — Ambulatory Visit (INDEPENDENT_AMBULATORY_CARE_PROVIDER_SITE_OTHER): Admitting: Neurology

## 2024-05-05 VITALS — BP 121/58 | HR 60

## 2024-05-05 DIAGNOSIS — Z978 Presence of other specified devices: Secondary | ICD-10-CM

## 2024-05-05 DIAGNOSIS — G8250 Quadriplegia, unspecified: Secondary | ICD-10-CM

## 2024-05-05 DIAGNOSIS — R252 Cramp and spasm: Secondary | ICD-10-CM

## 2024-05-05 DIAGNOSIS — M62838 Other muscle spasm: Secondary | ICD-10-CM

## 2024-05-05 DIAGNOSIS — G35 Multiple sclerosis: Secondary | ICD-10-CM | POA: Diagnosis not present

## 2024-05-05 MED ORDER — BACLOFEN 40000 MCG/20ML IT SOLN: 80000.0000 ug | Freq: Once | INTRATHECAL | Status: AC

## 2024-05-05 NOTE — Progress Notes (Signed)
 [facility]  PATIENT: Toni [name] DOB: 1945/09/27  REFERRING DOCTOR OR PCP: Dr. Chalice SOURCE: Patient, notes from Dr. Chalice and Dr. Jenel  _________________________________   HISTORICAL  CHIEF COMPLAINT:  Chief Complaint  Patient presents with   EMGRM2/Baclofen  Pump Refill    Pt is here with her Husband. Pt denies any problems with her Baclofen  pump.     HISTORY OF PRESENT ILLNESS:  Toni [name] is a 78 year old woman with multiple sclerosis.  [name]    UPDATE 02/25/2024.   She was hospitalized last weekend due to a urinary tract infection.  She has had several of these.  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  Spasticity and cognition are worse when she has a UTI.  She feels that the baclofen  pump continues to help her spasticity significantly. She is not experiencing any significant spasming during the day or night.  Pain is well-controlled.. She notes that she had been on ACTHar  in the past and felt better when she was on it.  She has tolerated that better than she did the prednisone .  She requires refills every 2 to 3 months.   She had her pump replaced in 2022 (Dr. Unice) and it should not need to be replaced until 2029.     She is in [facility] in [place].   Current pump is 40 cc.  She is on third pump - first one 2009, last one July 2022).  Since her spasticity is stable we will keep the dose the same.  She only rarely takes an oral baclofen . She is unable to walk but can assist some with transfers.  She uses a standing frame.  She is able to move her legs in bed and to go on her side in bed.  She also notes weakness in her hands.  She has dysesthesias in her abdomen and legs.  Gabapentin  is helping some.[name]        REVIEW OF SYSTEMS: Constitutional: No fevers, chills, sweats, or change in appetite Eyes: No visual changes, double vision, eye pain Ear, nose and throat: No hearing loss, ear  pain, nasal congestion, sore throat Cardiovascular: No chest pain, palpitations Respiratory:  No shortness of breath at rest or with exertion.   No wheezes GastrointestinaI: No nausea, vomiting, diarrhea, abdominal pain, fecal incontinence Genitourinary:  No dysuria, urinary retention or frequency.  No nocturia. Musculoskeletal:  No neck pain, back pain Integumentary: No rash, pruritus, skin lesions Neurological: as above Psychiatric: No depression at this time.  No anxiety Endocrine: No palpitations, diaphoresis, change in appetite, change in weigh or increased thirst Hematologic/Lymphatic:  No anemia, purpura, petechiae. Allergic/Immunologic: No itchy/runny eyes, nasal congestion, recent allergic reactions, rashes  ALLERGIES: Allergies  Allergen Reactions   Other Hives and Rash    Topical Surgical prep  - severe rash, hives   Hydrocodone  Nausea And Vomiting   Oxycodone  Nausea And Vomiting    Other Reaction(s): Not available   Zoloft [Sertraline] Hives, Swelling and Rash   Chloraseptic [Phenol] Hives   Macrobid  [Nitrofurantoin ] Nausea And Vomiting    Not documented on MAR   Vioxx [Rofecoxib] Other (See Comments)    Unknown reaction Can take NSAIDS   Hibiclens  [Chlorhexidine  Gluconate] Hives, Rash and Other (See Comments)    Sores    HOME MEDICATIONS:  Current Outpatient Medications:    acetaminophen  (TYLENOL ) 500 MG tablet, Take 1,000 mg by mouth every 8 (eight) hours as needed for fever or  headache (pain)., Disp: , Rfl:    ALPRAZolam  (XANAX ) 0.5 MG tablet, Take 1 tablet (0.5 mg total) by mouth at bedtime. May also take 1 tablet every 12 hours as needed anxiety, Disp: 10 tablet, Rfl: 0   amiodarone  (PACERONE ) 100 MG tablet, Take 100 mg by mouth 2 (two) times daily., Disp: , Rfl:    apixaban  (ELIQUIS ) 5 MG TABS tablet, Take 5 mg by mouth 2 (two) times daily., Disp: , Rfl:    baclofen  (LIORESAL ) 20 MG tablet, Take 20 mg by mouth daily as needed for muscle spasms., Disp: , Rfl:     Biotin  10000 MCG TABS, Take 1 tablet by mouth daily., Disp: , Rfl:    bisacodyl  (DULCOLAX) 10 MG suppository, Place 10 mg rectally See admin instructions. Insert 10 mg (1 suppository) rectally once daily on Sunday, Tuesday, Thursday. May use an additional suppository once daily as needed for constipation., Disp: , Rfl:    buPROPion  (WELLBUTRIN  XL) 150 MG 24 hr tablet, Take 1 tablet (150 mg total) by mouth daily., Disp: , Rfl:    cholecalciferol  (VITAMIN D3) 25 MCG (1000 UNIT) tablet, Take 2,000 Units by mouth 2 (two) times daily., Disp: , Rfl:    corticotropin  (ACTHAR ) 80 UNIT/ML injectable gel, Inject 80 mg every other day for 10 days and repeat monthy, Disp: 5 mL, Rfl: 5   docusate sodium  (COLACE) 100 MG capsule, Take 100 mg by mouth 2 (two) times daily., Disp: , Rfl:    famotidine  (PEPCID ) 20 MG tablet, Take 20 mg by mouth 2 (two) times daily., Disp: , Rfl:    gabapentin  (NEURONTIN ) 300 MG capsule, [name] will take 300 mg of gabapentin  at 6 AM, 10 AM, 2 PM and 6 PM and 600 mg at 10 PM by mouth. (Patient taking differently: 3 (three) times daily. [name] will take 300 mg of gabapentin  at 6 AM, 10 AM, 2 PM and 6 PM and 600 mg at 10 PM by mouth.), Disp: 180 capsule, Rfl: 11   hydrocortisone  (ANUSOL -HC) 25 MG suppository, Place 1 suppository (25 mg total) rectally 2 (two) times daily., Disp: 14 suppository, Rfl: 4   lactulose  (CHRONULAC ) 10 GM/15ML solution, Take 10 g by mouth 2 (two) times daily as needed., Disp: , Rfl:    levothyroxine  (SYNTHROID ) 75 MCG tablet, Take 75 mcg by mouth daily before breakfast., Disp: , Rfl:    LINZESS  72 MCG capsule, Take 72 mcg by mouth every morning., Disp: , Rfl:    melatonin 5 MG TABS, Take 5 mg by mouth at bedtime., Disp: , Rfl:    Menthol , Topical Analgesic, (BIOFREEZE COOL THE PAIN) 4 % GEL, Apply 1 application  topically 3 (three) times daily. To left calf, Disp: , Rfl:    metoprolol  succinate (TOPROL -XL) 25 MG 24 hr tablet, Take 25 mg by mouth daily.,  Disp: , Rfl:    midodrine  (PROAMATINE ) 5 MG tablet, Take 1 tablet (5 mg total) by mouth 3 (three) times daily with meals., Disp: , Rfl:    ondansetron  (ZOFRAN -ODT) 4 MG disintegrating tablet, Take 4 mg by mouth 4 (four) times daily as needed for nausea., Disp: , Rfl:    oxybutynin  (DITROPAN ) 5 MG tablet, Take 5 mg by mouth 2 (two) times daily as needed (overactive bladder)., Disp: , Rfl:    phenazopyridine  (PYRIDIUM ) 100 MG tablet, Take 100 mg by mouth every 8 (eight) hours as needed (dysuria)., Disp: , Rfl:    phenylephrine -shark liver oil-mineral oil-petrolatum (PREPARATION H) 0.25-14-74.9 % rectal ointment, Place  1 Application rectally 3 (three) times daily as needed for hemorrhoids., Disp: , Rfl:    Polyethyl Glycol-Propyl Glycol (SYSTANE) 0.4-0.3 % SOLN, Place 1 drop into both eyes in the morning and at bedtime., Disp: , Rfl:    polyethylene glycol (MIRALAX  / GLYCOLAX ) 17 g packet, Take 17 g by mouth daily as needed for mild constipation, moderate constipation or severe constipation., Disp: , Rfl:    potassium chloride  SA (KLOR-CON ) 20 MEQ tablet, Take 2 tablets (40 mEq total) by mouth daily., Disp: 180 tablet, Rfl: 3   promethazine  (PHENERGAN ) 12.5 MG tablet, Take 12.5 mg by mouth every 8 (eight) hours as needed for nausea or vomiting., Disp: , Rfl:    Psyllium (METAMUCIL SMOOTH TEXTURE PO), Take 1 packet by mouth 2 (two) times daily., Disp: , Rfl:    Salicylic Acid-Cleanser 6 % CREAM KIT, Apply 1 each topically See admin instructions. Apply topically once a day on Monday, Wednesday, Saturday to lesion on foot 3x weekly. Once soft, gently file with disposible nail file weekly, repeat until resolved., Disp: , Rfl:    senna (SENOKOT) 8.6 MG TABS tablet, Take 17.2 mg by mouth 2 (two) times daily., Disp: , Rfl:    silver  sulfADIAZINE (SILVADENE) 1 % cream, Apply 1 Application topically 2 (two) times daily., Disp: , Rfl:    sodium phosphate  (FLEET) 7-19 GM/118ML ENEM, Place 1 enema rectally daily as  needed (constipation)., Disp: , Rfl:    Torsemide  40 MG TABS, Take 60mg  in the morning and 40mg  in the evening for 7 days then go to 40mg  1 tablet twice a day Can take an additional 20mg  as needed for weight gain of 2lbs in a day or 5lbs in a week, Disp: 100 tablet, Rfl: 0   torsemide  (DEMADEX ) 20 MG tablet, Take 1 tablet (20 mg total) by mouth daily as needed (may take an additional 20 mg on the days of swelling or weight gain of 3 lb over night or 5 lb in 1 week.). (Patient not taking: Reported on 05/05/2024), Disp: 90 tablet, Rfl: 3   triamcinolone cream (KENALOG) 0.1 %, Apply 1 Application topically 2 (two) times daily. (Patient not taking: Reported on 05/05/2024), Disp: , Rfl:   Current Facility-Administered Medications:    baclofen  (GABLOFEN ) intrathecal injection 40000 mcg/20ml, 80,000 mcg, Intrathecal, Once, Luka Reisch A, MD   baclofen  (GABLOFEN ) intrathecal injection 40000 mcg/20ml, 80,000 mcg, Intrathecal, Once, Deidra Spease, Charlie LABOR, MD   baclofen  (GABLOFEN ) intrathecal injection 40000 mcg/20ml, 80,000 mcg, Intrathecal, Once, Maysun Meditz, Charlie LABOR, MD  PAST MEDICAL HISTORY: Past Medical History:  Diagnosis Date   Abnormality of gait 03/01/2015   Acute metabolic encephalopathy 06/04/2020   Anxiety    Arthritis    knees (01/19/2016)   Asthma    as a child    Atrial fibrillation (HCC)    Back pain    Bilateral carpal tunnel syndrome 10/04/2020   Cancer (HCC)    / RENAL CELL CARCINOMA - GETS CT SCAN EVERY 6 MONTHS TO WATCH    CHF (congestive heart failure) (HCC)    Chronic pain    nerve pain from the MS   Complex atypical endometrial hyperplasia    Constipation    COVID-19 long hauler manifesting chronic dyspnea 09/13/2020   COVID-19 virus infection 08/23/2019   Depression    DVT (deep venous thrombosis) (HCC) 1983   RLE; may have just been phlebitis; it was before the age of dopplers   Dysrhythmia    afib    Edema  varicose veins with severe venous insuff in R and L GSV'  ablation of R GSV 2012   GERD (gastroesophageal reflux disease)    HLD (hyperlipidemia)    Hypertension    Hypotension    Hypothyroidism    Joint pain    Multiple sclerosis (HCC)    has had this may 1989-[facility] reg doc   OSA on CPAP    bipap machine - SLEEP APNEA WORSE SINCE COVID IN 1/21 PER PATIENT SETTING HAS INCREASED FROM 9 TO 30    Osteoarthritis    PAF (paroxysmal atrial fibrillation) (HCC)    on coumadin ; documented on monitor 06/2010 - no longer takes per Nathanel Harness 02/09/21   Pneumonia 11/2011   Recurrent UTI 04/07/2020   Sepsis (HCC) 06/04/2020   Sepsis secondary to UTI (HCC) 11/20/2022   Sleep apnea    uses sleep apnea machine    PAST SURGICAL HISTORY: Past Surgical History:  Procedure Laterality Date   ANTERIOR CERVICAL DECOMP/DISCECTOMY FUSION  01/2004   CARDIAC CATHETERIZATION  06/22/2010   normal coronary arteries, PAF   CATARACT EXTRACTION, BILATERAL Bilateral    CHILD BIRTHS     X2   DILATATION & CURETTAGE/HYSTEROSCOPY WITH MYOSURE N/A 01/15/2020   Procedure: DILATATION & CURETTAGE/HYSTEROSCOPY WITH MYOSURE;  Surgeon: Leva Rush, MD;  Location: [facility];  Service: Gynecology;  Laterality: N/A;  pt in wheelchair-has MS   DILATION AND CURETTAGE OF UTERUS     DILATION AND CURETTAGE OF UTERUS N/A 02/16/2020   Procedure: DILATATION AND CURETTAGE;  Surgeon: Viktoria Comer SAUNDERS, MD;  Location: [facility];  Service: Gynecology;  Laterality: N/A;  NOT GENERAL -NO INTUBATION   DNC     INFUSION PUMP IMPLANTATION  ~ 2009   baclofen  infusion in lower abd   INTRATHECAL PUMP REVISION N/A 02/18/2021   Procedure: Baclofen  Pump replacement with possible revision of catheter;  Surgeon: Unice Pac, MD;  [facility];  Service: Neurosurgery;  Laterality: N/A;  3C/RM 20   INTRAUTERINE DEVICE (IUD) INSERTION N/A 02/16/2020   Procedure: INTRAUTERINE DEVICE (IUD) INSERTION - MIRENA ;  Surgeon: Viktoria Comer SAUNDERS, MD;  Location: [facility];  Service: Gynecology;  Laterality: N/A;    IUD put in uterus     PAIN PUMP REVISION N/A 07/29/2014   Procedure: Baclofen  pump replacement;  Surgeon: Pac Unice, MD;  Location: [facility];  Service: Neurosurgery;  Laterality: N/A;  Baclofen  pump replacement   TONSILLECTOMY AND ADENOIDECTOMY     VARICOSE VEIN SURGERY  ~ 1968   VENOUS ABLATION  12/16/2010   radiofreq ablation -Dr Leafy and [facility] [place]   WISDOM TOOTH EXTRACTION      FAMILY HISTORY: Family History  Problem Relation Age of Onset   Depression Mother    Sudden death Mother    Anxiety disorder Mother    Coronary artery disease Father        at age 57   Hyperlipidemia Father    Thyroid  disease Father    Coronary artery disease Maternal Grandmother    Depression Maternal Grandmother    Cancer Paternal Grandmother        breast   Alcoholism Son    Sleep apnea Neg Hx    Multiple sclerosis Neg Hx     SOCIAL HISTORY:  Social History   Socioeconomic History   Marital status: Married    Spouse name: [name]   Number of children: 2   Years of education: COLLEGE   Highest education level: Not on file  Occupational History   Occupation:  RETIRED  Tobacco Use   Smoking status: Former    Current packs/day: 0.00    Average packs/day: 0.5 packs/day for 7.0 years (3.5 ttl pk-yrs)    Types: Cigarettes    Start date: 08/21/1969    Quit date: 08/21/1976    Years since quitting: 47.7   Smokeless tobacco: Never  Vaping Use   Vaping status: Never Used  Substance and Sexual Activity   Alcohol  use: Not Currently   Drug use: No   Sexual activity: Not Currently    Birth control/protection: Post-menopausal  Other Topics Concern   Not on file  Social History Narrative   Lives in [place] with Husband Tanda [phone], [phone]   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   Right handed   Drinks a little caffeine in coffee sometimes    [facility] [place] [facility] [place]    Pt retired    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 Strain: Not on file  Food Insecurity: No Food Insecurity (08/13/2023)    Hunger Vital Sign    Worried About Running Out of Food in the Last Year: Never true    Ran Out of Food in the Last Year: Never true  Transportation Needs: No Transportation Needs (08/13/2023)   PRAPARE - Administrator, Civil Service (Medical): No    Lack of Transportation (Non-Medical): No  Physical Activity: Not on file  Stress: Not on file  Social Connections: Not on file  Intimate Partner Violence: Not At Risk (08/13/2023)   Humiliation, Afraid, Rape, and Kick questionnaire    Fear of Current or Ex-Partner: No    Emotionally Abused: No    Physically Abused: No    Sexually Abused: No     PHYSICAL EXAM  Vitals:   05/05/24 1442  BP: (!) 121/58  Pulse: 60       There is no height or weight on file to calculate BMI.   General: The patient is well-developed and well-nourished and in no acute distress  HEENT:  Head is [place]/AT.    Neurologic Exam  Mental status: The patient is alert and oriented x 3 at the time of the examination. The patient has apparent normal recent and remote memory, with an apparently normal attention span and concentration ability.   .  Cranial nerves: Extraocular movements are full.  Facial strength was normal  Motor:  Muscle bulk reduced at the thenar eminences.  The legs are hypotonic.  SHe has reduced grip bilaterally and 3/5 strength in the intrinsic hand muscles.  Strength is 2+/5 in the left leg and 2/5 in the right leg.  Sensory: Sensory testing is intact to touch x4.  Gait and station: She is wheelchair-bound..   Reflexes: Deep tendon reflexes are symmetric in the arms and absent in the legs.  There is no ankle clonus.    DIAGNOSTIC DATA (LABS, IMAGING, TESTING) - I reviewed patient records, labs, notes, testing and imaging myself where available.  Lab Results  Component Value Date   WBC 5.2 02/23/2024   HGB 13.2 02/23/2024   HCT 40.5 02/23/2024   MCV 99.5 02/23/2024   PLT 171 02/23/2024      Component Value  Date/Time   NA 137 02/23/2024 0444   NA 144 12/15/2020 1100   K 4.2 02/23/2024 0444   CL 97 (L) 02/23/2024 0444   CO2 33 (H) 02/23/2024 0444   GLUCOSE 77 02/23/2024 0444   BUN 26 (H) 02/23/2024 0444   BUN 27 12/15/2020 1100   CREATININE  0.78 02/23/2024 0444   CALCIUM  8.8 (L) 02/23/2024 0444   PROT 6.1 02/12/2024 1647   ALBUMIN 3.8 02/12/2024 1647   AST 15 02/12/2024 1647   ALT 10 02/12/2024 1647   ALKPHOS 70 02/12/2024 1647   BILITOT 0.3 02/12/2024 1647   GFRNONAA >60 02/23/2024 0444   GFRNONAA >90 02/11/2024 1004   GFRAA 90 09/20/2020 1436   Lab Results  Component Value Date   CHOL 111 03/19/2019   HDL 57 03/19/2019   LDLCALC 43 03/19/2019   TRIG 53 03/19/2019   CHOLHDL 3.0 04/22/2010   Lab Results  Component Value Date   HGBA1C 4.9 06/05/2020   No results found for: VITAMINB12 Lab Results  Component Value Date   TSH 3.470 02/12/2024       ASSESSMENT AND PLAN  Multiple sclerosis, primary chronic progressive (HCC) - Plan: baclofen  (GABLOFEN ) intrathecal injection 40000 mcg/20ml  Spastic quadriplegia (HCC) - Plan: baclofen  (GABLOFEN ) intrathecal injection 40000 mcg/57ml  Spasticity - Plan: baclofen  (GABLOFEN ) intrathecal injection 40000 mcg/20ml  Muscle spasm of both lower legs - Plan: baclofen  (GABLOFEN ) intrathecal injection 40000 mcg/86ml  Presence of intrathecal pump - Plan: baclofen  (GABLOFEN ) intrathecal injection 40000 mcg/20ml   The skin was prepped with Betadine and draped in sterile fashion. Center port of implanted pump accessed using 22g huber needle.  Residual fluid measuring 10 ml was removed from the reservoir.  Gablofen  2039mcg/mlx2 instilled into pump.   ERI is 10/2027. Next appt given for 05/05/24 at 3:00pm with Dr. Vear.  Next alarm date: 05/16/2024.  Next appt given for 07/14/24 at 2:30pm with Dr. Vear.  Next alarm date: 07/25/2024.    Medication placed in pump: Gablofen  40,027mcg,20ml x2. Lot: [id number] Exp: 11/2025. NDC: [id number].     She will return to see us  in about 3 months for the next pump refill.  She should call us  or Dr. Chalice sooner for new or worsening symptoms..   F/u with Dr. Chalice for other MS / neuro issues   Mulki Roesler A. Vear, MD, [facility] 05/05/2024, 4:39 PM Certified in Neurology, Clinical Neurophysiology, Sleep Medicine and Neuroimaging  [facility] [address] [place], [place] [address] [phone]

## 2024-05-05 NOTE — Progress Notes (Signed)
 RM 2, with husband. Skin prepped with alcohol  and draped in sterile fashion. Center port of implanted pump accessed using 22g huber needle.  Residual fluid measuring 8 ml was removed from the reservoir.  Gablofen  2036mcg/mlx2 instilled into pump. Pump reprogrammed to reflect a volume of 40ml.  ERI is 10/2027.    Next appt given for 07/14/24 at 2:30pm with Dr. Vear.  Next alarm date: 07/25/2024  Medication placed in pump: Gablofen  40,045mcg,20ml x2. Lot: [id number] Exp: 11/2025. NDC: [id number].     Template placed top right corner to measure. Below scar.

## 2024-05-29 ENCOUNTER — Ambulatory Visit: Attending: Internal Medicine | Admitting: Internal Medicine

## 2024-05-29 VITALS — BP 105/66 | HR 68 | Wt 213.0 lb

## 2024-05-29 DIAGNOSIS — E782 Mixed hyperlipidemia: Secondary | ICD-10-CM | POA: Insufficient documentation

## 2024-05-29 DIAGNOSIS — E039 Hypothyroidism, unspecified: Secondary | ICD-10-CM | POA: Diagnosis not present

## 2024-05-29 DIAGNOSIS — I5042 Chronic combined systolic (congestive) and diastolic (congestive) heart failure: Secondary | ICD-10-CM | POA: Insufficient documentation

## 2024-05-29 DIAGNOSIS — G35D Multiple sclerosis, unspecified: Secondary | ICD-10-CM | POA: Diagnosis present

## 2024-05-29 DIAGNOSIS — I4821 Permanent atrial fibrillation: Secondary | ICD-10-CM | POA: Insufficient documentation

## 2024-05-29 NOTE — Patient Instructions (Signed)
   Follow-Up: At [facility], you and your health needs are our priority.  As part of our continuing mission to provide you with exceptional heart care, our providers are all part of one team.  This team includes your primary Cardiologist (physician) and Advanced Practice Providers or APPs (Physician Assistants and Nurse Practitioners) who all work together to provide you with the care you need, when you need it.  Your next appointment:   6 month(s)  Provider:   Damien Braver, NP      Then, Toni JAYSON Maxcy, MD will plan to see you again in 12 month(s).

## 2024-05-29 NOTE — Progress Notes (Signed)
 OFFICE NOTE  Chief Complaint:  Follow-up hospitalization  Primary Care Physician: Toni GORMAN Dine, MD  HPI:  Toni [name] is a 78 year old female with multiple sclerosis, paroxysmal atrial fibrillation, on Coumadin , morbid obesity, obstructive sleep apnea, and dyslipidemia. She has had worsening swelling in her legs, thought to be due to an MS flare. She has a history of varicose veins with severe venous insufficiency in both the right and left greater saphenous veins. The right greater saphenous vein was actually successfully ablated by my partner, Dr. Leafy, about 1 to 2 years ago. She does have residual reflux in the left greater saphenous vein extending from the proximal calf up to the saphenofemoral junction. The vein measured between 4 and 8 mm with between 1 and 6 seconds of reflux. We had discussed ablation of that before, but she has canceled several times at procedures. At this point, I think there is little benefit to continuing to try for her to undergo ablation. 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 She can wear work showed a compression stockings and this does seem to help her. With regards to her atrial fibrillation she has not had significant recurrence to her knowledge. She continues on warfarin and has been therapeutic, with her INR of 1.9 in the office by fingerstick today.  Toni [name] returns today for followup. She reports her MS is somewhat progressive. She continues to have some enlargement of her legs but no significant swelling. There is a mild varicosities in the right lower extremity. She is unaware of her atrial fibrillation. INR today was 2.3.   I saw Toni [name] back in the office today,  She is describing more shortness of breath and feels that she's been in A. Fib more recently. In fact today6/21her EKG does show persistent atrial fibrillation. In the past she's been paroxysmal , but it may be that she is more  frequently having A. Fib. She has trouble telling whether not she is more short of breath or symptomatic from A. Fib versus her multiple sclerosis. She is also reporting more swelling in her left leg.  Toni [name] returns for follow-up today. She recently saw her neurologist regarding her MS which seems to be flaring. In addition she was noted to have significant apneic events despite using CPAP. There is concern for possible central sleep apnea. Both of these episodes are probably contributing to her recurrent atrial fibrillation. She's been wearing a monitor for a period of time which indicates a burden of A. fib of about 80% of the time. It does not appear that she is in persistent A. fib, rather she has had some episodes of sinus rhythm, but they're much less frequent than her A. Fib.  I saw Toni [name] back today in the office. Recently I saw her as an add-on visit briefly as she was describing some chest pain when she was getting her INR checked. An EKG at that time showed no ischemia. I did not feel that this was cardiac. She continues intermittently have abnormal pains both in her chest as well as her abdomen and extremities. This may be related to MS. She's had difficulty with CPAP and apparently getting a good mask fit. She has been a persistent atrial fibrillation for about 6 months. It's unclear whether or not she symptomatic with this and therefore I have not pursued a rhythm control strategy. Her rate control is generally pretty good and she has been therapeutic on  warfarin. She is reporting worsening swelling of her legs although her weight she says is been stable. She has a history of venous insufficiency and had a right greater saphenous vein ablation by one of my partners. She's concerned about more swelling in her left leg and whether she's developed worsening venous insufficiency. She also reports cold feet with numbness in her extremities. As she is basically wheelchair bound, I cannot assess  whether she is having any claudication.  Toni [name] returns today for follow-up. She underwent venous and arterial lower external Dopplers. Arterial Dopplers were normal which showed no evidence of obstruction. Her venous Doppler showed no DVT. There was significant venous reflux in the left greater saphenous vein. This is an area where she has swelling and discomfort. This may be amenable to treatment. I increased her Lasix  and she reports some improvement in her leg swelling. She's trying to elevate her feet and reports she is intolerant of wearing compression stockings.  02/03/2016  [name] was seen today in the office for follow-up of recent hospitalization. She was seeing her neurologist and complained of left chest and left arm pain and was referred to the hospital. She ruled out for MI and was felt that her chest pain was on coronary. She underwent an echocardiogram which showed low-normal EF but was also found to be in A. fib with rapid ventricular response. Her A. fib is persistent at this time but the rate was elevated. She was started on Cardizem  which helped improve her rate and was discharged on Cardizem  CD 120 mg daily. Heart rate today is better controlled in the 70s. She reports her arm pain is improved and she said today that she did not feel that it was her heart.  05/09/2016  Toni [name] was seen in the office for follow-up today. She reports improved heart rate control with heart rates between 50 and 80 on diltiazem . Over this will help with her mild cardiomyopathy with EF 50-55% on recent echo. She reports some increased fluid gain particularly in her abdomen. This may be due to some degree of right heart dysfunction. She's working with her neurologist for MS as well as her sleep apnea. There is a component of central sleep apnea and she is reportedly going to have additional sleep study to look for the need for BiPAP therapy. She denies any recurrent chest pain or left arm  pain.  02/14/2017  Toni [name] was seen today in follow-up. She was recently hospitalized. She said problems with increasing fluid gain. I increased her Lasix  last time however when she was hospitalized she required high-dose Lasix  and says she has she felt better with that. She subsequently been put on BiPAP therapy. She says her sleeping is improved significantly. She is concerned though recently she's had some increased weight gain. She feels like she may need an increasing dose of diuretic. Her last echo was in December 2017 which showed an EF of 55%. This is been stable. If this is some heart failure, may be related diastolic dysfunction however suspect is more likely related to autonomic dysfunction the setting of multiple sclerosis.  03/29/2017  Toni [name] returns today for follow-up. She continues to report lower extremity edema and abdominal swelling. We'll increase her Lasix  however she has not noted significant weight loss. It is actually very difficult to estimate her weight because of her MS. We tried to place her on the scale today however she was unable to stand long enough to get an accurate  weight. She reports urination was not as good on 40 mg of Lasix  but is currently taking 80 mg once daily and has better urination with that.  05/18/2017  Toni [name] was seen today in follow-up. She reports some improvement in her abdominal swelling and bloating. Check she feels like she might be somewhat dry now. Her weight is down to 246 pounds from 253 pounds. She is also working with the weight loss clinic at [facility]. She is interested in decreasing her diuretic which is reasonable. She asked again about probably trying to get back into sinus rhythm however she's been in persistent A. fib which is long-standing at this point for more than one or 2 years. Her last echo in December 2017 showed mild left atrial enlargement. Is not clear that she's symptomatic with her A. fib although does feel tired at  times. She says she's had some improvement with her sleep apnea therapy.  01/15/2018  Toni [name] returns today for follow-up.  She is continued to lose some weight.  Weight is down now to 218 pounds, significant improvement from her prior weight over 250 pounds.  She also had marked reduction in edema.  Her blood pressure is low normal at the moment and may benefit from reduction in her blood pressure medications.  Her sleep apnea has improved significantly according to Dr. Chalice, especially her obstructive symptoms.  She still has central sleep apnea.  In addition she has secondary progressive MS and recently had worsening symptoms associated with that.  Heart rate was elevated today with regards to A. fib however that did come down after short period of time.  She again appears to be asymptomatic with her A. fib.  The main reason not to pursue sinus rhythm is the fact that she has been persistently in A. fib for well over 5 years, and the likelihood of reestablishing sinus rhythm is exceedingly low.  05/16/2018  Toni [name] is seen today in follow-up.  Her weight is down even further today to 204.  She has had recently some positional dizziness and low blood pressures.  This is likely due to the significant weight loss and I would recommend a decrease in her medications.  She says that she is breathing better.  Her sleep apnea has improved significantly according to her sleep doctor and she has having less symptoms related to her MS.  She is in permanent A. fib with controlled ventricular response.  03/10/2019  Toni [name] returns today for follow-up.  She is done very well overall.  She is continued to lose weight with Dr. Larae work.  Weight now 186 pounds down from 210 when I last saw her.  This is amazing since she is essentially sedentary due to her MS.  She reports reasonable control of her A. fib.  She has had some decrease in her PA P requirements with regards to sleep apnea.  Blood pressure is  well controlled.  She has recently had some swelling of the lower extremities and her abdomen.  She wonders if this could be some heart failure.  I suspect the lower extremity swelling is most likely due to venous insufficiency.  I previously performed a venous ablation on the right lower extremity and she still has some venous insufficiency of the left lower extremity however in the common femoral vein not amenable to ablation.  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   01/28/2020  Toni [name] is seen today in follow-up.  She continues to lose some weight related to her diet by Dr. Verdon.  She today is in A. fib with RVR.  Blood pressure has run low recently.  She has not needed to use midodrine  that frequently.  She has follow-up with Dr. Chalice in July.  I recently had corresponded with her about findings that Ms. Craun was hypoxic during the day as well as at night.  There are plans to both give her oxygen  via BiPAP as well as possible oxygen  during the day.  She has been referred to see Dr. Aleen at [facility] pulmonary.  04/29/2020  Toni [name] seen today for follow-up of A. fib.  Is been 3 months since I last saw her.  In the meantime she has been set up with bleed oxygen  into her BiPAP.  I increased her diltiazem  which is resulted in some improvement in heart rate although there is still a variability related to her A. fib.  Blood pressure is still reasonable in the low 100 systolic.  She has not required taking additional midodrine .  She feels like she is getting some abdominal bloating/swelling and has started to take some Lasix  as needed for this.  She was taken off of this due to orthostasis.  09/20/2020  Toni [name] is seen today in follow-up.  Recently she has been having more difficulty with hypoxemia and her MS.  All of this seems to have started after she had COVID-19.  She recently saw Dr. Chalice and still is having difficulty with hypoxemia.  She is having a repeat sleep  study and I am told I would likely need to prescribe oxygen  at night for her since apparently only either pulmonologist or cardiologist can order oxygen .  Recently her home health noted that she was swelling.  Her Lasix  was increased to between 20 to 40 mg in the morning and 60 mg at night.  She wonders if this is led to some electrolyte abnormalities as its not been reassessed.  Blood pressure is good today.  Weight has been fairly stable.  09/08/2021  Toni [name] returns for follow-up.  She has been residing at [facility].  More recently we have been notified of some worsening lower extremity edema.  She says she has had some increasing abdominal girth.  The physician assistant there had made some changes in her diuretics.  Actually weight today seems fairly stable.  She is noted to be in A. fib which is permanent but rate controlled.  BNP was performed at their facility and only mildly elevated around 120.  Otherwise labs have been reassuring.  06/08/2022  Toni [name] is seen today in follow-up.  She is residing at [facility].  She has recently noted more abdominal fullness and lower extremity swelling concerning for heart failure although she is on diuretics and has had at least 2 BNP test performed which were negative for any fluid retention.  She remains in the permanent atrial fibrillation which is rate controlled.  She has transition from warfarin to Eliquis  due to some labile INRs and the need for frequent antibiotics.  I think this is a good idea and try to get her to switch over to Eliquis  for a long time.  This is less likely to interact with her medications and is superior to warfarin.  09/14/2022  Toni [name] is seen today in follow-up.  Unfortunately she has had issues heart failure as well as GI issues since I last saw her.  Mainly she has had issues  with recurrent this.  She cannot tolerate Entresto  as I had switched her to this because of low blood pressure.  She was found to have  some cardiomyopathy with LVEF 45 to 50% on echo.  She has responded to increasing doses of torsemide  and now is required midodrine  for raising her blood pressure.  She says that her breathing has improved.  Weight is stayed stable if not improved somewhat.  She is not sure if she is actually getting her midodrine  3 times a day as scheduled or as needed.  She should maintain systolic blood pressure over 100.  06/20/2023  Toni [name] is seen today in follow-up of recent hospitalization.  Apparently she was hospitalized for hyponatremia and found to have a UTI.  She had was gently hydrated and her diuretics were held.  Fairly she was hypotensive and her beta-blocker was held but she was continued on midodrine .  Now she returns for follow-up.  She is tachycardic in A-fib today with a rate of 116.  She says that she has been getting both the midodrine  and requested taking metoprolol .  Oftentimes they do not give her the metoprolol  however because of low blood pressure.  Overall though she feels like she is improving.  05/29/2024  Toni [name] seen today for follow-up.  She continues to reside at [facility].  She is here today for follow-up.  She had several questions about her diuretic situation.  She is seen Damien Braver twice recently who noted that she was somewhat volume overloaded.  She is now on an increased regimen of diuretics.  She has also been on amiodarone  for predominant rate control of her A-fib along with metoprolol .  She struggled somewhat with hypotension particularly during her recent hospitalization and is on midodrine .  Low blood pressure has limited GDMT for heart failure.  Her last LVEF was 45% by echo.  She did have some concerns about her rising TSH recently however and said that it was around 7, but I was not able to find that.  I reviewed some labs that were drawn recently through her assisted living.  This did show mildly elevated BNP at 813 but normal renal function.  PMHx:  Past  Medical History:  Diagnosis Date   Abnormality of gait 03/01/2015   Acute metabolic encephalopathy 06/04/2020   Anxiety    Arthritis    knees (01/19/2016)   Asthma    as a child    Atrial fibrillation (HCC)    Back pain    Bilateral carpal tunnel syndrome 10/04/2020   Cancer (HCC)    / RENAL CELL CARCINOMA - GETS CT SCAN EVERY 6 MONTHS TO WATCH    CHF (congestive heart failure) (HCC)    Chronic pain    nerve pain from the MS   Complex atypical endometrial hyperplasia    Constipation    COVID-19 long hauler manifesting chronic dyspnea 09/13/2020   COVID-19 virus infection 08/23/2019   Depression    DVT (deep venous thrombosis) (HCC) 1983   RLE; may have just been phlebitis; it was before the age of dopplers   Dysrhythmia    afib    Edema    varicose veins with severe venous insuff in R and L GSV' ablation of R GSV 2012   GERD (gastroesophageal reflux disease)    HLD (hyperlipidemia)    Hypertension    Hypotension    Hypothyroidism    Joint pain    Multiple sclerosis    has had this may  1989-[facility] reg doc   OSA on CPAP    bipap machine - SLEEP APNEA WORSE SINCE COVID IN 1/21 PER PATIENT SETTING HAS INCREASED FROM 9 TO 30    Osteoarthritis    PAF (paroxysmal atrial fibrillation) (HCC)    on coumadin ; documented on monitor 06/2010 - no longer takes per Nathanel Harness 02/09/21   Pneumonia 11/2011   Recurrent UTI 04/07/2020   Sepsis (HCC) 06/04/2020   Sepsis secondary to UTI (HCC) 11/20/2022   Sleep apnea    uses sleep apnea machine    Past Surgical History:  Procedure Laterality Date   ANTERIOR CERVICAL DECOMP/DISCECTOMY FUSION  01/2004   CARDIAC CATHETERIZATION  06/22/2010   normal coronary arteries, PAF   CATARACT EXTRACTION, BILATERAL Bilateral    CHILD BIRTHS     X2   DILATATION & CURETTAGE/HYSTEROSCOPY WITH MYOSURE N/A 01/15/2020   Procedure: DILATATION & CURETTAGE/HYSTEROSCOPY WITH MYOSURE;  Surgeon: Leva Rush, MD;  Location: [facility];  Service:  Gynecology;  Laterality: N/A;  pt in wheelchair-has MS   DILATION AND CURETTAGE OF UTERUS     DILATION AND CURETTAGE OF UTERUS N/A 02/16/2020   Procedure: DILATATION AND CURETTAGE;  Surgeon: Viktoria Comer SAUNDERS, MD;  Location: [facility];  Service: Gynecology;  Laterality: N/A;  NOT GENERAL -NO INTUBATION   DNC     INFUSION PUMP IMPLANTATION  ~ 2009   baclofen  infusion in lower abd   INTRATHECAL PUMP REVISION N/A 02/18/2021   Procedure: Baclofen  Pump replacement with possible revision of catheter;  Surgeon: Unice Pac, MD;  Location: [facility];  Service: Neurosurgery;  Laterality: N/A;  3C/RM 20   INTRAUTERINE DEVICE (IUD) INSERTION N/A 02/16/2020   Procedure: INTRAUTERINE DEVICE (IUD) INSERTION - MIRENA ;  Surgeon: Viktoria Comer SAUNDERS, MD;  Location: [facility];  Service: Gynecology;  Laterality: N/A;   IUD put in uterus     PAIN PUMP REVISION N/A 07/29/2014   Procedure: Baclofen  pump replacement;  Surgeon: Pac Unice, MD;  Location: [facility];  Service: Neurosurgery;  Laterality: N/A;  Baclofen  pump replacement   TONSILLECTOMY AND ADENOIDECTOMY     VARICOSE VEIN SURGERY  ~ 1968   VENOUS ABLATION  12/16/2010   radiofreq ablation -Dr Leafy and Telsa Dillavou   WISDOM TOOTH EXTRACTION      FAMHx:  Family History  Problem Relation Age of Onset   Depression Mother    Sudden death Mother    Anxiety disorder Mother    Coronary artery disease Father        at age 45   Hyperlipidemia Father    Thyroid  disease Father    Coronary artery disease Maternal Grandmother    Depression Maternal Grandmother    Cancer Paternal Grandmother        breast   Alcoholism Son    Sleep apnea Neg Hx    Multiple sclerosis Neg Hx     SOCHx:   reports that she quit smoking about 47 years ago. Her smoking use included cigarettes. She started smoking about 54 years ago. She has a 3.5 pack-year smoking history. She has never used smokeless tobacco. She reports that she does not currently use alcohol . She reports that  she does not use drugs.  ALLERGIES:  Allergies  Allergen Reactions   Other Hives and Rash    Topical Surgical prep  - severe rash, hives   Hydrocodone  Nausea And Vomiting   Oxycodone  Nausea And Vomiting    Other Reaction(s): Not available   Zoloft [Sertraline] Hives, Swelling and Rash  Chloraseptic [Phenol] Hives   Macrobid  [Nitrofurantoin ] Nausea And Vomiting    Not documented on MAR   Vioxx [Rofecoxib] Other (See Comments)    Unknown reaction Can take NSAIDS   Hibiclens  [Chlorhexidine  Gluconate] Hives, Rash and Other (See Comments)    Sores    ROS: Pertinent items noted in HPI and remainder of comprehensive ROS otherwise negative.  HOME MEDS: Current Outpatient Medications  Medication Sig Dispense Refill   acetaminophen  (TYLENOL ) 500 MG tablet Take 1,000 mg by mouth every 8 (eight) hours as needed for fever or headache (pain).     ALPRAZolam  (XANAX ) 0.5 MG tablet Take 1 tablet (0.5 mg total) by mouth at bedtime. May also take 1 tablet every 12 hours as needed anxiety 10 tablet 0   amiodarone  (PACERONE ) 100 MG tablet Take 100 mg by mouth 2 (two) times daily.     apixaban  (ELIQUIS ) 5 MG TABS tablet Take 5 mg by mouth 2 (two) times daily.     baclofen  (LIORESAL ) 20 MG tablet Take 20 mg by mouth daily as needed for muscle spasms.     Biotin  10000 MCG TABS Take 1 tablet by mouth daily.     bisacodyl  (DULCOLAX) 10 MG suppository Place 10 mg rectally See admin instructions. Insert 10 mg (1 suppository) rectally once daily on Sunday, Tuesday, Thursday. May use an additional suppository once daily as needed for constipation.     buPROPion  (WELLBUTRIN  XL) 150 MG 24 hr tablet Take 1 tablet (150 mg total) by mouth daily.     cholecalciferol  (VITAMIN D3) 25 MCG (1000 UNIT) tablet Take 2,000 Units by mouth 2 (two) times daily.     corticotropin  (ACTHAR ) 80 UNIT/ML injectable gel Inject 80 mg every other day for 10 days and repeat monthy 5 mL 5   docusate sodium  (COLACE) 100 MG capsule Take  100 mg by mouth 2 (two) times daily.     famotidine  (PEPCID ) 20 MG tablet Take 20 mg by mouth 2 (two) times daily.     gabapentin  (NEURONTIN ) 300 MG capsule Mrs. Lall will take 300 mg of gabapentin  at 6 AM, 10 AM, 2 PM and 6 PM and 600 mg at 10 PM by mouth. (Patient taking differently: 3 (three) times daily. Mrs. Ennen will take 300 mg of gabapentin  at 6 AM, 10 AM, 2 PM and 6 PM and 600 mg at 10 PM by mouth.) 180 capsule 11   hydrocortisone  (ANUSOL -HC) 25 MG suppository Place 1 suppository (25 mg total) rectally 2 (two) times daily. 14 suppository 4   lactulose  (CHRONULAC ) 10 GM/15ML solution Take 10 g by mouth 2 (two) times daily as needed.     levothyroxine  (SYNTHROID ) 75 MCG tablet Take 75 mcg by mouth daily before breakfast.     LINZESS  72 MCG capsule Take 72 mcg by mouth every morning.     melatonin 5 MG TABS Take 5 mg by mouth at bedtime.     Menthol , Topical Analgesic, (BIOFREEZE COOL THE PAIN) 4 % GEL Apply 1 application  topically 3 (three) times daily. To left calf     metoprolol  succinate (TOPROL -XL) 25 MG 24 hr tablet Take 25 mg by mouth daily.     midodrine  (PROAMATINE ) 5 MG tablet Take 1 tablet (5 mg total) by mouth 3 (three) times daily with meals.     ondansetron  (ZOFRAN -ODT) 4 MG disintegrating tablet Take 4 mg by mouth 4 (four) times daily as needed for nausea.     oxybutynin  (DITROPAN ) 5 MG tablet Take 5 mg  by mouth 2 (two) times daily as needed (overactive bladder).     phenazopyridine  (PYRIDIUM ) 100 MG tablet Take 100 mg by mouth every 8 (eight) hours as needed (dysuria).     phenylephrine -shark liver oil-mineral oil-petrolatum (PREPARATION H) 0.25-14-74.9 % rectal ointment Place 1 Application rectally 3 (three) times daily as needed for hemorrhoids.     Polyethyl Glycol-Propyl Glycol (SYSTANE) 0.4-0.3 % SOLN Place 1 drop into both eyes in the morning and at bedtime.     polyethylene glycol (MIRALAX  / GLYCOLAX ) 17 g packet Take 17 g by mouth daily as needed for mild constipation,  moderate constipation or severe constipation.     potassium chloride  SA (KLOR-CON ) 20 MEQ tablet Take 2 tablets (40 mEq total) by mouth daily. 180 tablet 3   promethazine  (PHENERGAN ) 12.5 MG tablet Take 12.5 mg by mouth every 8 (eight) hours as needed for nausea or vomiting.     Psyllium (METAMUCIL SMOOTH TEXTURE PO) Take 1 packet by mouth 2 (two) times daily.     Salicylic Acid-Cleanser 6 % CREAM KIT Apply 1 each topically See admin instructions. Apply topically once a day on Monday, Wednesday, Saturday to lesion on foot 3x weekly. Once soft, gently file with disposible nail file weekly, repeat until resolved.     senna (SENOKOT) 8.6 MG TABS tablet Take 17.2 mg by mouth 2 (two) times daily.     silver  sulfADIAZINE (SILVADENE) 1 % cream Apply 1 Application topically 2 (two) times daily.     sodium phosphate  (FLEET) 7-19 GM/118ML ENEM Place 1 enema rectally daily as needed (constipation).     torsemide  (DEMADEX ) 20 MG tablet Take 1 tablet (20 mg total) by mouth daily as needed (may take an additional 20 mg on the days of swelling or weight gain of 3 lb over night or 5 lb in 1 week.). 90 tablet 3   Torsemide  40 MG TABS Take 60mg  in the morning and 40mg  in the evening for 7 days then go to 40mg  1 tablet twice a day Can take an additional 20mg  as needed for weight gain of 2lbs in a day or 5lbs in a week 100 tablet 0   Current Facility-Administered Medications  Medication Dose Route Frequency Provider Last Rate Last Admin   baclofen  (GABLOFEN ) intrathecal injection 40000 mcg/20ml  80,000 mcg Intrathecal Once Sater, Richard A, MD       baclofen  (GABLOFEN ) intrathecal injection 40000 mcg/20ml  80,000 mcg Intrathecal Once Sater, Charlie LABOR, MD       baclofen  (GABLOFEN ) intrathecal injection 40000 mcg/20ml  80,000 mcg Intrathecal Once Sater, Charlie LABOR, MD        LABS/IMAGING: No results found. However, due to the size of the patient record, not all encounters were searched. Please check Results Review for  a complete set of results. No results found.  VITALS: BP 105/66   Pulse 68   Wt 213 lb (96.6 kg)   SpO2 96%   BMI 36.56 kg/m   EXAM: General appearance: alert, no distress and moderately obese Neck: no carotid bruit, no JVD and thyroid  not enlarged, symmetric, no tenderness/mass/nodules Lungs: clear to auscultation bilaterally Heart: irregularly irregular rhythm Abdomen: soft, non-tender; bowel sounds normal; no masses,  no organomegaly Extremities: edema 1+ lower extremity Pulses: 2+ and symmetric Skin: Skin color, texture, turgor normal. No rashes or lesions Neurologic: Mental status: Alert, oriented, thought content appropriate Psych: Pleasant  EKG: Deferred  ASSESSMENT: Permanent atrial fibrillation, on Eliquis  Morbid obesity Obstructive sleep apnea on BIPAP Multiple sclerosis Chronic  venous insufficiency - +LGSV reflux Hypertension-controlled Noncardiac chest and left arm pain Non-ischemic cardiomyopathy - EF 50%, improved to 55% (07/2016) -> more recently LVEF was measured at 45% with global hypokinesis (08/2023) Chronic diastolic congestive heart failure Hyponatremia  PLAN: 1.   Toni [name] seems to be doing fairly well after changes and titrations in her medicine.  She did have some questions about her thyroid  on the amiodarone .  Recent TSH was apparently going up.  She asked if I would recheck her thyroid  today as well as her lipid profile.  I would recommend continuing her current diuretic regimen.  Follow-up with Damien in 6 months or sooner as necessary and then with me in 1 year.  Vinie KYM Maxcy, MD, [facility], FNLA, FACP  [place]  [facility]  Medical Director of the [facility]  [facility] Diplomate of the American Board of Clinical Lipidology Attending Cardiologist  Direct Dial: [phone]  Fax: [phone]  Website:  [email or web address] Vinie JAYSON Maxcy 05/29/2024, 9:57 PM

## 2024-06-02 ENCOUNTER — Ambulatory Visit: Payer: Self-pay | Admitting: Internal Medicine

## 2024-06-18 ENCOUNTER — Telehealth: Payer: Self-pay | Admitting: Neurology

## 2024-06-18 NOTE — Telephone Encounter (Signed)
 Pt is asking if RN can contact DME for a CPAP download because in the past she has not been able to use her CPAP as a result of poor wifi.  Wifi fixed, pt thinks her pressure # may need to be increased, please call pt at [phone]

## 2024-06-18 NOTE — Telephone Encounter (Signed)
 I was able to pull a download:

## 2024-06-19 NOTE — Telephone Encounter (Signed)
 A residual AHI of only 7/ h is GOOD !  I am happy to see that number.  Please let Mrs Fassler know we dont need any pressure adjustments made.  Dedra Gores, MD

## 2024-06-19 NOTE — Telephone Encounter (Signed)
 Tried to reach patient twice to see why she believes her pressure should be adjusted. Phone rang several times then received a busy signal.  [phone]

## 2024-06-23 NOTE — Telephone Encounter (Signed)
 Patient returned phone call, transferred to POD 3

## 2024-06-23 NOTE — Telephone Encounter (Signed)
 Pt returned call and stated she does not have VM on her phone. I informed her of Dr Dohmeier's message and patient verbalized understanding and appreciation.

## 2024-06-23 NOTE — Telephone Encounter (Signed)
 Attempted to call pt to let her know Dr Dohmeier's message. Phone rang several times then received a busy signal. Dr Chalice sent patient a Clinical Cytogeneticist message.

## 2024-07-14 ENCOUNTER — Encounter: Admitting: Neurology

## 2024-07-15 ENCOUNTER — Ambulatory Visit: Admitting: Neurology

## 2024-07-15 VITALS — BP 122/69 | HR 80 | Resp 16 | Ht 64.0 in

## 2024-07-15 DIAGNOSIS — G35B0 Primary progressive multiple sclerosis, unspecified: Secondary | ICD-10-CM | POA: Diagnosis not present

## 2024-07-15 DIAGNOSIS — R252 Cramp and spasm: Secondary | ICD-10-CM

## 2024-07-15 DIAGNOSIS — G8250 Quadriplegia, unspecified: Secondary | ICD-10-CM

## 2024-07-15 DIAGNOSIS — Z978 Presence of other specified devices: Secondary | ICD-10-CM

## 2024-07-15 DIAGNOSIS — M62838 Other muscle spasm: Secondary | ICD-10-CM

## 2024-07-15 MED ORDER — BACLOFEN 40 MG/20ML IT SOLN: 80.0000 mg | Freq: Once | INTRATHECAL | Status: AC

## 2024-07-15 MED ORDER — BACLOFEN 40000 MCG/20ML IT SOLN: 80000.0000 ug | Freq: Once | INTRATHECAL | Status: DC

## 2024-07-15 NOTE — Progress Notes (Signed)
 She is here today for a baclofen  pump refill.  She reports that her spasticity is well-controlled and she has no painful spasms.  She has been unable to bear weight for many years.  The pump was refilled as detailed above without complication.

## 2024-07-15 NOTE — Progress Notes (Signed)
 RM 2, with caregiver. Skin prepped with alcohol  and draped in sterile fashion. Center port of implanted pump accessed using 22g huber needle.  Residual fluid measuring 12 ml was removed from the reservoir.  Gablofen  2052mcg/mlx2 instilled into pump. Pump reprogrammed to reflect a volume of 40ml. ERI is 10/2027.    Next appt given for 09/22/2024 at 2:30pm with Dr. Vear.  Next alarm date: 10/04/2024.  Medication placed in pump: Gablofen  40,053mcg,20ml x2. Lot: [id number] Exp: 01/2026. NDC: [id number].     Template placed top right corner to measure. Below scar.

## 2024-07-17 ENCOUNTER — Other Ambulatory Visit: Payer: Self-pay | Admitting: Neurology

## 2024-07-18 ENCOUNTER — Other Ambulatory Visit: Payer: Self-pay

## 2024-07-18 NOTE — Telephone Encounter (Signed)
 Is the Patient still on this medication?

## 2024-07-25 ENCOUNTER — Telehealth: Payer: Self-pay

## 2024-07-25 ENCOUNTER — Other Ambulatory Visit (HOSPITAL_COMMUNITY): Payer: Self-pay

## 2024-07-25 NOTE — Telephone Encounter (Signed)
 Pharmacy Patient Advocate Encounter   Received notification from Fax that prior authorization for [place]  Gel is required/requested.   Insurance verification completed.   The patient is insured through [facility].   Per test claim: The current 28 day co-pay is, $0.  No PA needed at this time. This test claim was processed through [facility]- copay amounts may vary at other pharmacies due to pharmacy/plan contracts, or as the patient moves through the different stages of their insurance plan.

## 2024-08-05 ENCOUNTER — Encounter: Payer: Self-pay | Admitting: Internal Medicine

## 2024-09-02 ENCOUNTER — Encounter: Payer: Self-pay | Admitting: Neurology

## 2024-09-05 ENCOUNTER — Telehealth: Payer: Self-pay

## 2024-09-05 NOTE — Telephone Encounter (Signed)
 Referral received from Dr.McCloud's office for follow up appointment with Dr.Tucker.  I called Ms.Arterburn to discuss need for follow up. She states she has no concerns and is not bleeding no pelvic/abdominal pain. She states Dr.McComb sent her back to our office because he was unable to do an exam d/t she is in a wheelchair,stating he does not have special equipment   According to phone note from 01/26/23 pt does not need to see us  since there is no cancer diagnosis.   I also called and spoke to Arland at [facility] and relayed the message from Dr.Tucker. She states she will make Dr.McComb aware.

## 2024-09-16 NOTE — Telephone Encounter (Signed)
 Pt called and stated that [facility] informed her that they did not have any refills available for the pt. Please advise.

## 2024-09-21 ENCOUNTER — Telehealth: Payer: Self-pay | Admitting: Neurology

## 2024-09-22 ENCOUNTER — Encounter: Admitting: Neurology

## 2024-09-23 NOTE — Telephone Encounter (Signed)
 noted

## 2024-09-23 NOTE — Telephone Encounter (Signed)
 Called and scheduled baclofen  pump refil as requested

## 2024-09-29 ENCOUNTER — Encounter: Admitting: Neurology

## 2024-11-26 ENCOUNTER — Ambulatory Visit: Admitting: Nurse Practitioner

## 2024-12-08 ENCOUNTER — Ambulatory Visit: Admitting: Neurology
